# Patient Record
Sex: Male | Born: 1955 | Race: White | Hispanic: No | Marital: Married | Smoking: Current every day smoker
Health system: Southern US, Community
[De-identification: ages and names within clinical notes are randomized; demographics above are authoritative.]

## PROBLEM LIST (undated history)

## (undated) DIAGNOSIS — I10 Essential (primary) hypertension: Secondary | ICD-10-CM

## (undated) DIAGNOSIS — E119 Type 2 diabetes mellitus without complications: Secondary | ICD-10-CM

## (undated) DIAGNOSIS — I200 Unstable angina: Secondary | ICD-10-CM

## (undated) DIAGNOSIS — G4730 Sleep apnea, unspecified: Secondary | ICD-10-CM

## (undated) DIAGNOSIS — Z8719 Personal history of other diseases of the digestive system: Secondary | ICD-10-CM

## (undated) DIAGNOSIS — E785 Hyperlipidemia, unspecified: Secondary | ICD-10-CM

## (undated) DIAGNOSIS — M1990 Unspecified osteoarthritis, unspecified site: Secondary | ICD-10-CM

## (undated) DIAGNOSIS — I739 Peripheral vascular disease, unspecified: Secondary | ICD-10-CM

## (undated) DIAGNOSIS — J449 Chronic obstructive pulmonary disease, unspecified: Secondary | ICD-10-CM

## (undated) DIAGNOSIS — R4781 Slurred speech: Secondary | ICD-10-CM

## (undated) DIAGNOSIS — K219 Gastro-esophageal reflux disease without esophagitis: Secondary | ICD-10-CM

## (undated) DIAGNOSIS — K760 Fatty (change of) liver, not elsewhere classified: Secondary | ICD-10-CM

## (undated) DIAGNOSIS — N186 End stage renal disease: Secondary | ICD-10-CM

## (undated) DIAGNOSIS — K08109 Complete loss of teeth, unspecified cause, unspecified class: Secondary | ICD-10-CM

## (undated) DIAGNOSIS — K5792 Diverticulitis of intestine, part unspecified, without perforation or abscess without bleeding: Secondary | ICD-10-CM

## (undated) DIAGNOSIS — M48062 Spinal stenosis, lumbar region with neurogenic claudication: Secondary | ICD-10-CM

## (undated) DIAGNOSIS — Z972 Presence of dental prosthetic device (complete) (partial): Secondary | ICD-10-CM

## (undated) DIAGNOSIS — G709 Myoneural disorder, unspecified: Secondary | ICD-10-CM

## (undated) DIAGNOSIS — K5730 Diverticulosis of large intestine without perforation or abscess without bleeding: Secondary | ICD-10-CM

## (undated) DIAGNOSIS — T4145XA Adverse effect of unspecified anesthetic, initial encounter: Secondary | ICD-10-CM

## (undated) DIAGNOSIS — Z9289 Personal history of other medical treatment: Secondary | ICD-10-CM

## (undated) DIAGNOSIS — T8859XA Other complications of anesthesia, initial encounter: Secondary | ICD-10-CM

## (undated) DIAGNOSIS — N189 Chronic kidney disease, unspecified: Secondary | ICD-10-CM

## (undated) DIAGNOSIS — Z973 Presence of spectacles and contact lenses: Secondary | ICD-10-CM

## (undated) DIAGNOSIS — I453 Trifascicular block: Secondary | ICD-10-CM

## (undated) DIAGNOSIS — L97521 Non-pressure chronic ulcer of other part of left foot limited to breakdown of skin: Secondary | ICD-10-CM

## (undated) DIAGNOSIS — M869 Osteomyelitis, unspecified: Secondary | ICD-10-CM

## (undated) DIAGNOSIS — Z992 Dependence on renal dialysis: Secondary | ICD-10-CM

## (undated) DIAGNOSIS — I6529 Occlusion and stenosis of unspecified carotid artery: Secondary | ICD-10-CM

## (undated) HISTORY — DX: Fatty (change of) liver, not elsewhere classified: K76.0

## (undated) HISTORY — DX: Unstable angina: I20.0

## (undated) HISTORY — PX: FINGER SURGERY: SHX640

## (undated) HISTORY — DX: Spinal stenosis, lumbar region with neurogenic claudication: M48.062

## (undated) HISTORY — DX: Peripheral vascular disease, unspecified: I73.9

## (undated) HISTORY — DX: Gastro-esophageal reflux disease without esophagitis: K21.9

## (undated) HISTORY — PX: BELOW KNEE LEG AMPUTATION: SUR23

## (undated) HISTORY — DX: Chronic obstructive pulmonary disease, unspecified: J44.9

## (undated) HISTORY — PX: COLON SURGERY: SHX602

## (undated) HISTORY — DX: Diverticulosis of large intestine without perforation or abscess without bleeding: K57.30

## (undated) HISTORY — PX: PENILE PROSTHESIS IMPLANT: SHX240

## (undated) HISTORY — DX: Slurred speech: R47.81

## (undated) HISTORY — DX: Sleep apnea, unspecified: G47.30

## (undated) HISTORY — DX: Unspecified osteoarthritis, unspecified site: M19.90

## (undated) HISTORY — DX: Occlusion and stenosis of unspecified carotid artery: I65.29

## (undated) HISTORY — PX: BACK SURGERY: SHX140

## (undated) HISTORY — PX: CAROTID STENT: SHX1301

## (undated) HISTORY — PX: HERNIA REPAIR: SHX51

## (undated) HISTORY — PX: TOE AMPUTATION: SHX809

## (undated) HISTORY — PX: COLONOSCOPY: SHX174

## (undated) HISTORY — DX: Non-pressure chronic ulcer of other part of left foot limited to breakdown of skin: L97.521

## (undated) HISTORY — PX: FEMORAL ARTERY STENT: SHX1583

## (undated) HISTORY — PX: TONSILLECTOMY: SUR1361

## (undated) HISTORY — PX: SPINE SURGERY: SHX786

## (undated) HISTORY — DX: Essential (primary) hypertension: I10

## (undated) HISTORY — DX: Hyperlipidemia, unspecified: E78.5

## (undated) HISTORY — PX: NECK SURGERY: SHX720

## (undated) HISTORY — PX: CHOLECYSTECTOMY: SHX55

## (undated) HISTORY — PX: SHOULDER ARTHROSCOPY: SHX128

## (undated) HISTORY — PX: TOTAL KNEE ARTHROPLASTY: SHX125

## (undated) HISTORY — DX: Trifascicular block: I45.3

## (undated) HISTORY — PX: LAMINECTOMY: SHX219

## (undated) SURGERY — Surgical Case
Anesthesia: *Unknown

---

## 1998-02-09 ENCOUNTER — Ambulatory Visit (HOSPITAL_COMMUNITY): Admission: RE | Admit: 1998-02-09 | Discharge: 1998-02-09 | Payer: Self-pay | Admitting: Neurological Surgery

## 1998-02-20 ENCOUNTER — Ambulatory Visit (HOSPITAL_COMMUNITY): Admission: RE | Admit: 1998-02-20 | Discharge: 1998-02-20 | Payer: Self-pay | Admitting: Neurological Surgery

## 1998-03-25 ENCOUNTER — Other Ambulatory Visit: Admission: RE | Admit: 1998-03-25 | Discharge: 1998-03-25 | Payer: Self-pay | Admitting: *Deleted

## 1998-12-29 ENCOUNTER — Inpatient Hospital Stay (HOSPITAL_COMMUNITY): Admission: RE | Admit: 1998-12-29 | Discharge: 1998-12-30 | Payer: Self-pay | Admitting: Orthopaedic Surgery

## 1999-09-11 HISTORY — PX: JOINT REPLACEMENT: SHX530

## 1999-10-06 ENCOUNTER — Encounter: Admission: RE | Admit: 1999-10-06 | Discharge: 1999-10-06 | Payer: Self-pay | Admitting: General Surgery

## 1999-10-06 ENCOUNTER — Encounter (HOSPITAL_BASED_OUTPATIENT_CLINIC_OR_DEPARTMENT_OTHER): Payer: Self-pay | Admitting: General Surgery

## 1999-10-09 ENCOUNTER — Ambulatory Visit (HOSPITAL_BASED_OUTPATIENT_CLINIC_OR_DEPARTMENT_OTHER): Admission: RE | Admit: 1999-10-09 | Discharge: 1999-10-09 | Payer: Self-pay | Admitting: General Surgery

## 1999-10-09 ENCOUNTER — Encounter (INDEPENDENT_AMBULATORY_CARE_PROVIDER_SITE_OTHER): Payer: Self-pay | Admitting: Specialist

## 1999-10-11 ENCOUNTER — Encounter: Payer: Self-pay | Admitting: Emergency Medicine

## 1999-10-12 ENCOUNTER — Encounter: Payer: Self-pay | Admitting: Emergency Medicine

## 1999-10-12 ENCOUNTER — Inpatient Hospital Stay (HOSPITAL_COMMUNITY): Admission: EM | Admit: 1999-10-12 | Discharge: 1999-10-13 | Payer: Self-pay | Admitting: Emergency Medicine

## 2000-08-05 ENCOUNTER — Encounter: Admission: RE | Admit: 2000-08-05 | Discharge: 2000-11-03 | Payer: Self-pay | Admitting: Endocrinology

## 2000-08-28 ENCOUNTER — Ambulatory Visit (HOSPITAL_COMMUNITY): Admission: RE | Admit: 2000-08-28 | Discharge: 2000-08-28 | Payer: Self-pay | Admitting: Endocrinology

## 2000-08-28 ENCOUNTER — Encounter: Payer: Self-pay | Admitting: Endocrinology

## 2000-08-30 ENCOUNTER — Encounter: Payer: Self-pay | Admitting: Endocrinology

## 2000-08-30 ENCOUNTER — Ambulatory Visit (HOSPITAL_COMMUNITY): Admission: RE | Admit: 2000-08-30 | Discharge: 2000-08-30 | Payer: Self-pay | Admitting: Endocrinology

## 2001-01-09 ENCOUNTER — Encounter: Admission: RE | Admit: 2001-01-09 | Discharge: 2001-01-09 | Payer: Self-pay

## 2001-07-02 ENCOUNTER — Encounter: Admission: RE | Admit: 2001-07-02 | Discharge: 2001-09-30 | Payer: Self-pay | Admitting: Endocrinology

## 2002-07-11 HISTORY — PX: TOTAL KNEE ARTHROPLASTY: SHX125

## 2002-07-23 ENCOUNTER — Encounter: Payer: Self-pay | Admitting: Orthopedic Surgery

## 2002-07-29 ENCOUNTER — Inpatient Hospital Stay (HOSPITAL_COMMUNITY): Admission: RE | Admit: 2002-07-29 | Discharge: 2002-08-03 | Payer: Self-pay | Admitting: Orthopedic Surgery

## 2002-07-29 ENCOUNTER — Encounter: Payer: Self-pay | Admitting: Orthopedic Surgery

## 2003-07-14 ENCOUNTER — Inpatient Hospital Stay (HOSPITAL_COMMUNITY): Admission: RE | Admit: 2003-07-14 | Discharge: 2003-07-17 | Payer: Self-pay | Admitting: Orthopaedic Surgery

## 2004-10-27 ENCOUNTER — Inpatient Hospital Stay (HOSPITAL_COMMUNITY): Admission: EM | Admit: 2004-10-27 | Discharge: 2004-11-10 | Payer: Self-pay | Admitting: *Deleted

## 2004-10-30 ENCOUNTER — Encounter (INDEPENDENT_AMBULATORY_CARE_PROVIDER_SITE_OTHER): Payer: Self-pay | Admitting: Cardiology

## 2004-10-31 HISTORY — PX: CARDIAC CATHETERIZATION: SHX172

## 2004-11-09 ENCOUNTER — Encounter (INDEPENDENT_AMBULATORY_CARE_PROVIDER_SITE_OTHER): Payer: Self-pay | Admitting: *Deleted

## 2004-11-09 HISTORY — PX: LAPAROSCOPIC CHOLECYSTECTOMY W/ CHOLANGIOGRAPHY: SUR757

## 2005-01-25 ENCOUNTER — Ambulatory Visit (HOSPITAL_COMMUNITY): Admission: RE | Admit: 2005-01-25 | Discharge: 2005-01-25 | Payer: Self-pay | Admitting: *Deleted

## 2005-01-25 ENCOUNTER — Encounter (INDEPENDENT_AMBULATORY_CARE_PROVIDER_SITE_OTHER): Payer: Self-pay | Admitting: Specialist

## 2005-08-14 ENCOUNTER — Ambulatory Visit (HOSPITAL_COMMUNITY): Admission: RE | Admit: 2005-08-14 | Discharge: 2005-08-14 | Payer: Self-pay | Admitting: Urology

## 2005-08-14 ENCOUNTER — Ambulatory Visit (HOSPITAL_BASED_OUTPATIENT_CLINIC_OR_DEPARTMENT_OTHER): Admission: RE | Admit: 2005-08-14 | Discharge: 2005-08-14 | Payer: Self-pay | Admitting: Urology

## 2005-08-14 HISTORY — PX: PENILE PROSTHESIS IMPLANT: SHX240

## 2006-02-06 ENCOUNTER — Inpatient Hospital Stay (HOSPITAL_COMMUNITY): Admission: RE | Admit: 2006-02-06 | Discharge: 2006-02-07 | Payer: Self-pay | Admitting: Orthopaedic Surgery

## 2006-02-06 HISTORY — PX: ANTERIOR FUSION CERVICAL SPINE: SUR626

## 2006-11-06 ENCOUNTER — Encounter (INDEPENDENT_AMBULATORY_CARE_PROVIDER_SITE_OTHER): Payer: Self-pay | Admitting: Specialist

## 2006-11-06 ENCOUNTER — Ambulatory Visit (HOSPITAL_COMMUNITY): Admission: RE | Admit: 2006-11-06 | Discharge: 2006-11-06 | Payer: Self-pay | Admitting: *Deleted

## 2006-11-08 ENCOUNTER — Encounter: Admission: RE | Admit: 2006-11-08 | Discharge: 2006-11-08 | Payer: Self-pay | Admitting: Orthopaedic Surgery

## 2006-11-15 ENCOUNTER — Inpatient Hospital Stay (HOSPITAL_COMMUNITY): Admission: RE | Admit: 2006-11-15 | Discharge: 2006-11-18 | Payer: Self-pay | Admitting: Orthopedic Surgery

## 2007-10-10 ENCOUNTER — Ambulatory Visit (HOSPITAL_COMMUNITY): Admission: RE | Admit: 2007-10-10 | Discharge: 2007-10-10 | Payer: Self-pay | Admitting: General Surgery

## 2008-08-10 ENCOUNTER — Encounter (INDEPENDENT_AMBULATORY_CARE_PROVIDER_SITE_OTHER): Payer: Self-pay | Admitting: *Deleted

## 2008-08-10 ENCOUNTER — Ambulatory Visit (HOSPITAL_COMMUNITY): Admission: RE | Admit: 2008-08-10 | Discharge: 2008-08-10 | Payer: Self-pay | Admitting: *Deleted

## 2009-04-13 ENCOUNTER — Emergency Department (HOSPITAL_COMMUNITY): Admission: EM | Admit: 2009-04-13 | Discharge: 2009-04-13 | Payer: Self-pay | Admitting: Emergency Medicine

## 2009-05-13 ENCOUNTER — Encounter: Admission: RE | Admit: 2009-05-13 | Discharge: 2009-05-13 | Payer: Self-pay | Admitting: Orthopedic Surgery

## 2010-08-30 ENCOUNTER — Emergency Department (HOSPITAL_COMMUNITY)
Admission: EM | Admit: 2010-08-30 | Discharge: 2010-08-30 | Payer: Self-pay | Source: Home / Self Care | Admitting: Emergency Medicine

## 2010-09-30 ENCOUNTER — Emergency Department (HOSPITAL_COMMUNITY)
Admission: EM | Admit: 2010-09-30 | Discharge: 2010-09-30 | Payer: Self-pay | Source: Home / Self Care | Admitting: Emergency Medicine

## 2010-10-03 LAB — CBC
HCT: 38.9 % — ABNORMAL LOW (ref 39.0–52.0)
Hemoglobin: 14 g/dL (ref 13.0–17.0)
MCH: 30.4 pg (ref 26.0–34.0)
MCHC: 36 g/dL (ref 30.0–36.0)
MCV: 84.4 fL (ref 78.0–100.0)
Platelets: 201 10*3/uL (ref 150–400)
RBC: 4.61 MIL/uL (ref 4.22–5.81)
RDW: 12.6 % (ref 11.5–15.5)
WBC: 7.1 10*3/uL (ref 4.0–10.5)

## 2010-10-03 LAB — DIFFERENTIAL
Basophils Absolute: 0.1 10*3/uL (ref 0.0–0.1)
Basophils Relative: 1 % (ref 0–1)
Eosinophils Absolute: 0.4 10*3/uL (ref 0.0–0.7)
Eosinophils Relative: 5 % (ref 0–5)
Lymphocytes Relative: 25 % (ref 12–46)
Lymphs Abs: 1.8 10*3/uL (ref 0.7–4.0)
Monocytes Absolute: 0.6 10*3/uL (ref 0.1–1.0)
Monocytes Relative: 9 % (ref 3–12)
Neutro Abs: 4.3 10*3/uL (ref 1.7–7.7)
Neutrophils Relative %: 61 % (ref 43–77)

## 2010-10-03 LAB — POCT I-STAT, CHEM 8
BUN: 11 mg/dL (ref 6–23)
Calcium, Ion: 1.17 mmol/L (ref 1.12–1.32)
Chloride: 101 mEq/L (ref 96–112)
Creatinine, Ser: 0.8 mg/dL (ref 0.4–1.5)
Glucose, Bld: 262 mg/dL — ABNORMAL HIGH (ref 70–99)
HCT: 41 % (ref 39.0–52.0)
Hemoglobin: 13.9 g/dL (ref 13.0–17.0)
Potassium: 3.8 mEq/L (ref 3.5–5.1)
Sodium: 136 mEq/L (ref 135–145)
TCO2: 27 mmol/L (ref 0–100)

## 2010-10-03 LAB — GLUCOSE, CAPILLARY: Glucose-Capillary: 280 mg/dL — ABNORMAL HIGH (ref 70–99)

## 2010-10-11 ENCOUNTER — Other Ambulatory Visit: Payer: Self-pay | Admitting: Endocrinology

## 2010-10-11 DIAGNOSIS — H539 Unspecified visual disturbance: Secondary | ICD-10-CM

## 2010-10-11 DIAGNOSIS — R200 Anesthesia of skin: Secondary | ICD-10-CM

## 2010-10-12 ENCOUNTER — Ambulatory Visit
Admission: RE | Admit: 2010-10-12 | Discharge: 2010-10-12 | Disposition: A | Discharge status: Home / Self Care | Payer: Medicare Other | Source: Ambulatory Visit | Attending: Endocrinology | Admitting: Endocrinology

## 2010-10-12 DIAGNOSIS — R200 Anesthesia of skin: Secondary | ICD-10-CM

## 2010-10-12 DIAGNOSIS — R42 Dizziness and giddiness: Secondary | ICD-10-CM

## 2010-10-12 DIAGNOSIS — H539 Unspecified visual disturbance: Secondary | ICD-10-CM

## 2010-10-12 MED ORDER — IOHEXOL 300 MG/ML  SOLN
75.0000 mL | Freq: Once | INTRAMUSCULAR | Status: AC | PRN
  Administered 2010-10-12: 75 mL via INTRAVENOUS

## 2010-10-23 ENCOUNTER — Other Ambulatory Visit: Payer: Self-pay | Admitting: Neurology

## 2010-10-23 DIAGNOSIS — R42 Dizziness and giddiness: Secondary | ICD-10-CM

## 2010-10-25 ENCOUNTER — Ambulatory Visit
Admission: RE | Admit: 2010-10-25 | Discharge: 2010-10-25 | Disposition: A | Discharge status: Home / Self Care | Payer: Medicare Other | Source: Ambulatory Visit | Attending: Neurology | Admitting: Neurology

## 2010-10-25 DIAGNOSIS — R42 Dizziness and giddiness: Secondary | ICD-10-CM

## 2010-10-25 MED ORDER — IOHEXOL 350 MG/ML SOLN: 100.0000 mL | Freq: Once | INTRAVENOUS | Status: DC | PRN

## 2010-11-06 ENCOUNTER — Other Ambulatory Visit (INDEPENDENT_AMBULATORY_CARE_PROVIDER_SITE_OTHER): Payer: BC Managed Care – HMO

## 2010-11-06 ENCOUNTER — Encounter (INDEPENDENT_AMBULATORY_CARE_PROVIDER_SITE_OTHER): Payer: BC Managed Care – HMO | Admitting: Vascular Surgery

## 2010-11-06 DIAGNOSIS — I6529 Occlusion and stenosis of unspecified carotid artery: Secondary | ICD-10-CM

## 2010-11-07 ENCOUNTER — Encounter: Payer: BC Managed Care – HMO | Admitting: Vascular Surgery

## 2010-11-07 NOTE — Consult Note (Signed)
NEW PATIENT CONSULTATION  [name], [name] DOB:  26-Mar-1956                                       11/06/2010 [id number]  HISTORY OF PRESENT ILLNESS:  The patient is a 55 year old male referred by Dr. Marcial Pacas.  The patient is a medical patient of Dr. Gareth Eagle. This patient began having visual symptoms in December 2011 which he describes as color changes in his left visual field but not total blindness.  Initially, these were intermittent but he states now they are almost constant.  His wife states he has also had some episodes of slurred speech lasting a few minutes, but no episodes of hemiparesis. He also complains of some vague weakness in both extremities, upper and lower on occasion.  He has no history of previous stroke.  He had a previous evaluation by Dr. Krista Blue for these neurologic symptoms.  This consisted of an MRI and CT scan as well as a CT angiogram.  The CT angiogram I have reviewed and reveals a very tight left internal carotid stenosis with no right internal carotid occlusive disease.  There is a string sign with very diminutive flow to the base of the skull.  There is atypical atherosclerotic plaque at the left carotid bifurcation. There is no evidence of acute stroke or hemorrhage.  The patient has been on Plavix for the past 2 weeks.  He has continued to have intermittent symptoms but these have been going on for 2 months he states.  CHRONIC MEDICAL PROBLEMS: 1. Diabetes mellitus type 1. 2. Hypertension. 3. Hyperlipidemia. 4. COPD. 5. Degenerative joint disease. 6. Previous lumbar laminectomy on 3 occasions and 2 cervical spine     operative procedures as well as a right total knee replacement and     rotator cuff surgery in the right shoulder. 7. GERD. 8. Negative for coronary artery disease or stroke.  SOCIAL HISTORY:  The patient is married, has 3 children, and is disabled.  He does smoke a pack of cigarettes per day.  He does  not use alcohol.  FAMILY HISTORY:  Positive for coronary artery disease and diabetes in the father.  Negative for stroke.  REVIEW OF SYSTEMS:  Positive for anorexia, dizziness, headaches, lower extremity discomfort with walking, wheezing, arthritis joint pain, chest pain, dyspnea on exertion, depression, reflux esophagitis, hiatal hernia repair, diarrhea.  All other systems are negative in complete review of systems.  PHYSICAL EXAM:  Blood pressure 173/99, heart rate 97, respirations 24. General:  He is a well-developed, well-nourished male who is in no apparent distress, alert and oriented x3.  HEENT exam is normal for age. EOMs intact.  Lungs:  Clear to auscultation.  No rhonchi or wheezing. Cardiovascular exam:  Regular rhythm.  No murmurs.  Carotid pulses are 3+.  No audible bruits.  Abdomen:  Soft, nontender with no masses. Musculoskeletal:  Exam is free of major deformities.  Neurologic: Normal.  Skin:  Free of rashes.  Lower extremity exam reveals 3+ femoral and 2+ dorsalis pedis pulses palpable bilaterally.  Today, I ordered a carotid duplex exam in our office which I have reviewed and interpreted.  He does have a very tight left internal carotid stenosis at the bifurcation with visualization of some flow distally but not consistently in the very distal internal carotid artery.  IMPRESSION:  Severe left internal carotid occlusive disease.  I doubt  that the distal vessel is occluded after reviewing CT angiogram and duplex scan but this is a possibility.  Because of the patient having ongoing symptoms, I think the best plan would be to explore left carotid bifurcation and perform an endarterectomy if feasible.  If the patient is found to have a totally occluded internal carotid artery, hopefully this will not cause any neurologic changes since I did not alter his circulation.  We will get a Cardiac clearance from Dr. Rollene Fare prior to surgery since he has seen Dr. Rollene Fare  in the past and he does complain of chest pain.  The risks and benefits of this carotid surgery including stroke and delayed hemorrhage in the left hemisphere have been thoroughly discussed with the patient and his wife, and they do understand and would like to proceed.  We have scheduled his surgery for this Friday, March 2nd.    Nelda Severe Kellie Simmering, M.D. Electronically Signed  JDL/MEDQ  D:  11/06/2010  T:  11/07/2010  Job:  [id number]

## 2010-11-09 ENCOUNTER — Encounter (HOSPITAL_COMMUNITY)
Admission: RE | Admit: 2010-11-09 | Discharge: 2010-11-09 | Disposition: A | Discharge status: Home / Self Care | Payer: BC Managed Care – HMO | Source: Ambulatory Visit | Attending: Vascular Surgery | Admitting: Vascular Surgery

## 2010-11-09 DIAGNOSIS — Z01812 Encounter for preprocedural laboratory examination: Secondary | ICD-10-CM | POA: Insufficient documentation

## 2010-11-09 LAB — CBC
HCT: 42.7 % (ref 39.0–52.0)
Hemoglobin: 15.9 g/dL (ref 13.0–17.0)
MCH: 31 pg (ref 26.0–34.0)
MCHC: 37.2 g/dL — ABNORMAL HIGH (ref 30.0–36.0)
MCV: 83.2 fL (ref 78.0–100.0)
Platelets: 211 10*3/uL (ref 150–400)
RBC: 5.13 MIL/uL (ref 4.22–5.81)
RDW: 12.7 % (ref 11.5–15.5)
WBC: 13.9 10*3/uL — ABNORMAL HIGH (ref 4.0–10.5)

## 2010-11-09 LAB — PROTIME-INR
INR: 0.91 (ref 0.00–1.49)
Prothrombin Time: 12.5 seconds (ref 11.6–15.2)

## 2010-11-09 LAB — COMPREHENSIVE METABOLIC PANEL
ALT: 17 U/L (ref 0–53)
AST: 16 U/L (ref 0–37)
Albumin: 3.9 g/dL (ref 3.5–5.2)
Alkaline Phosphatase: 113 U/L (ref 39–117)
BUN: 9 mg/dL (ref 6–23)
CO2: 25 mEq/L (ref 19–32)
Calcium: 9.2 mg/dL (ref 8.4–10.5)
Chloride: 97 mEq/L (ref 96–112)
Creatinine, Ser: 0.7 mg/dL (ref 0.4–1.5)
GFR calc Af Amer: >60 mL/min (ref >60)
GFR calc non Af Amer: >60 mL/min (ref >60)
Glucose, Bld: 311 mg/dL — ABNORMAL HIGH (ref 70–99)
Potassium: 4.1 mEq/L (ref 3.5–5.1)
Sodium: 134 mEq/L — ABNORMAL LOW (ref 135–145)
Total Bilirubin: 0.8 mg/dL (ref 0.3–1.2)
Total Protein: 6.1 g/dL (ref 6.0–8.3)

## 2010-11-09 LAB — URINE MICROSCOPIC-ADD ON

## 2010-11-09 LAB — URINALYSIS, ROUTINE W REFLEX MICROSCOPIC
Bilirubin Urine: NEGATIVE
Hgb urine dipstick: NEGATIVE
Ketones, ur: NEGATIVE mg/dL
Leukocytes, UA: NEGATIVE
Nitrite: NEGATIVE
Protein, ur: 30 mg/dL — AB
Specific Gravity, Urine: 1.035 — ABNORMAL HIGH (ref 1.005–1.030)
Urine Glucose, Fasting: >1000 mg/dL — AB
Urobilinogen, UA: 0.2 mg/dL (ref 0.0–1.0)
pH: 6 (ref 5.0–8.0)

## 2010-11-09 LAB — SURGICAL PCR SCREEN
MRSA, PCR: NEGATIVE
Staphylococcus aureus: NEGATIVE

## 2010-11-09 LAB — APTT: aPTT: 24 seconds (ref 24–37)

## 2010-11-09 NOTE — Procedures (Unsigned)
CAROTID DUPLEX EXAM  INDICATION:  Carotid artery disease.  HISTORY: Diabetes:  Yes. Cardiac:  No. Hypertension:  Yes. Smoking:  Yes. Previous Surgery:  No. CV History:  Near syncope, vision abnormalities, slurred speech, all per the patient.                                      RIGHT             LEFT Brachial systolic pressure:         173               179 Brachial Doppler waveforms: Vertebral direction of flow:        Antegrade         Antegrade DUPLEX VELOCITIES (cm/sec) CCA peak systolic                   112               [id number] ECA peak systolic                   145               [id number] ICA peak systolic                   M=87              [id number] ICA end diastolic                   37                129 PLAQUE MORPHOLOGY:                  Calcific          Homogeneous PLAQUE AMOUNT:                      Minimal           Large PLAQUE LOCATION:                    Bifurcation       ICA  IMPRESSION: 1. Right internal carotid artery shows no evidence of hemodynamically     significant stenosis. 2. Left internal carotid artery velocities are suggestive of 80% to     99% stenosis proximally, with mid velocities dropping to 87 cm/s     with no end diastolic.  Unable to visualize flow distally.  Cannot     rule out minimal distal flow. 3. Left internal carotid artery narrow channel of flow where     visualized.  ___________________________________________ Nelda Severe Kellie Simmering, M.D.  AS/MEDQ  D:  11/06/2010  T:  11/06/2010  Job:  [id number]

## 2010-11-10 ENCOUNTER — Inpatient Hospital Stay (HOSPITAL_COMMUNITY)
Admission: RE | Admit: 2010-11-10 | Discharge: 2010-11-12 | DRG: 838 | Disposition: A | Discharge status: Home / Self Care | Payer: BC Managed Care – HMO | Source: Ambulatory Visit | Attending: Vascular Surgery | Admitting: Vascular Surgery

## 2010-11-10 ENCOUNTER — Inpatient Hospital Stay (HOSPITAL_COMMUNITY): Payer: BC Managed Care – HMO

## 2010-11-10 ENCOUNTER — Other Ambulatory Visit: Payer: Self-pay | Admitting: Vascular Surgery

## 2010-11-10 DIAGNOSIS — Z01818 Encounter for other preprocedural examination: Secondary | ICD-10-CM

## 2010-11-10 DIAGNOSIS — I6529 Occlusion and stenosis of unspecified carotid artery: Secondary | ICD-10-CM

## 2010-11-10 DIAGNOSIS — R072 Precordial pain: Secondary | ICD-10-CM | POA: Diagnosis not present

## 2010-11-10 DIAGNOSIS — F17200 Nicotine dependence, unspecified, uncomplicated: Secondary | ICD-10-CM | POA: Diagnosis present

## 2010-11-10 DIAGNOSIS — Z7902 Long term (current) use of antithrombotics/antiplatelets: Secondary | ICD-10-CM

## 2010-11-10 DIAGNOSIS — J4489 Other specified chronic obstructive pulmonary disease: Secondary | ICD-10-CM | POA: Diagnosis present

## 2010-11-10 DIAGNOSIS — Z794 Long term (current) use of insulin: Secondary | ICD-10-CM

## 2010-11-10 DIAGNOSIS — E119 Type 2 diabetes mellitus without complications: Secondary | ICD-10-CM | POA: Diagnosis present

## 2010-11-10 DIAGNOSIS — J449 Chronic obstructive pulmonary disease, unspecified: Secondary | ICD-10-CM | POA: Diagnosis present

## 2010-11-10 DIAGNOSIS — Z7982 Long term (current) use of aspirin: Secondary | ICD-10-CM

## 2010-11-10 DIAGNOSIS — G4733 Obstructive sleep apnea (adult) (pediatric): Secondary | ICD-10-CM | POA: Diagnosis present

## 2010-11-10 HISTORY — PX: CAROTID ENDARTERECTOMY: SUR193

## 2010-11-10 HISTORY — DX: Occlusion and stenosis of unspecified carotid artery: I65.29

## 2010-11-10 LAB — GLUCOSE, CAPILLARY
Glucose-Capillary: 518 mg/dL — ABNORMAL HIGH (ref 70–99)
Glucose-Capillary: 369 mg/dL — ABNORMAL HIGH (ref 70–99)
Glucose-Capillary: 244 mg/dL — ABNORMAL HIGH (ref 70–99)
Glucose-Capillary: 232 mg/dL — ABNORMAL HIGH (ref 70–99)
Glucose-Capillary: 129 mg/dL — ABNORMAL HIGH (ref 70–99)

## 2010-11-11 LAB — GLUCOSE, CAPILLARY
Glucose-Capillary: 232 mg/dL — ABNORMAL HIGH (ref 70–99)
Glucose-Capillary: 513 mg/dL — ABNORMAL HIGH (ref 70–99)
Glucose-Capillary: 368 mg/dL — ABNORMAL HIGH (ref 70–99)
Glucose-Capillary: 169 mg/dL — ABNORMAL HIGH (ref 70–99)
Glucose-Capillary: 256 mg/dL — ABNORMAL HIGH (ref 70–99)
Glucose-Capillary: 278 mg/dL — ABNORMAL HIGH (ref 70–99)

## 2010-11-11 LAB — CBC
HCT: 35.3 % — ABNORMAL LOW (ref 39.0–52.0)
Hemoglobin: 12.6 g/dL — ABNORMAL LOW (ref 13.0–17.0)
MCH: 29.9 pg (ref 26.0–34.0)
MCHC: 35.7 g/dL (ref 30.0–36.0)
MCV: 83.6 fL (ref 78.0–100.0)
Platelets: 171 10*3/uL (ref 150–400)
RBC: 4.22 MIL/uL (ref 4.22–5.81)
RDW: 13 % (ref 11.5–15.5)
WBC: 9.9 10*3/uL (ref 4.0–10.5)

## 2010-11-11 LAB — BASIC METABOLIC PANEL
BUN: 14 mg/dL (ref 6–23)
CO2: 22 mEq/L (ref 19–32)
Calcium: 8.7 mg/dL (ref 8.4–10.5)
Chloride: 103 mEq/L (ref 96–112)
Creatinine, Ser: 0.81 mg/dL (ref 0.4–1.5)
GFR calc Af Amer: >60 mL/min (ref >60)
GFR calc non Af Amer: >60 mL/min (ref >60)
Glucose, Bld: 248 mg/dL — ABNORMAL HIGH (ref 70–99)
Potassium: 3.9 mEq/L (ref 3.5–5.1)
Sodium: 134 mEq/L — ABNORMAL LOW (ref 135–145)

## 2010-11-11 LAB — CARDIAC PANEL(CRET KIN+CKTOT+MB+TROPI)
CK, MB: 1.2 ng/mL (ref 0.3–4.0)
CK, MB: 1.3 ng/mL (ref 0.3–4.0)
CK, MB: 1.1 ng/mL (ref 0.3–4.0)
Relative Index: INVALID (ref 0.0–2.5)
Relative Index: INVALID (ref 0.0–2.5)
Relative Index: INVALID (ref 0.0–2.5)
Total CK: 53 U/L (ref 7–232)
Total CK: 59 U/L (ref 7–232)
Total CK: 65 U/L (ref 7–232)
Troponin I: <0.01 ng/mL (ref 0.00–0.06)
Troponin I: 0.01 ng/mL (ref 0.00–0.06)
Troponin I: 0.01 ng/mL (ref 0.00–0.06)

## 2010-11-11 LAB — HEMOGLOBIN A1C
Hgb A1c MFr Bld: 9.9 % — ABNORMAL HIGH (ref <5.7)
Mean Plasma Glucose: 237 mg/dL — ABNORMAL HIGH (ref <117)

## 2010-11-12 LAB — CROSSMATCH
ABO/RH(D): AB NEG
Antibody Screen: NEGATIVE
Unit division: 0
Unit division: 0

## 2010-11-12 LAB — GLUCOSE, CAPILLARY: Glucose-Capillary: 169 mg/dL — ABNORMAL HIGH (ref 70–99)

## 2010-11-12 LAB — CARDIAC PANEL(CRET KIN+CKTOT+MB+TROPI)
CK, MB: 1.1 ng/mL (ref 0.3–4.0)
Relative Index: INVALID (ref 0.0–2.5)
Total CK: 54 U/L (ref 7–232)
Troponin I: <0.01 ng/mL (ref 0.00–0.06)

## 2010-11-13 NOTE — Op Note (Signed)
[name], [name]                 ACCOUNT NO.:  [id number]  MEDICAL RECORD NO.:  [id number]           PATIENT TYPE:  I  LOCATION:  [id number]                         FACILITY:  [facility]  PHYSICIAN:  Nelda Severe. Kellie Simmering, M.D.  DATE OF BIRTH:  [birth year]  DATE OF PROCEDURE:  11/10/2010 DATE OF DISCHARGE:                              OPERATIVE REPORT   PREOPERATIVE DIAGNOSIS:  Subtotal occlusion of left internal carotid artery with left hemispheric transient ischemic attacks.  POSTOPERATIVE DIAGNOSIS:  Subtotal occlusion of left internal carotid artery with left hemispheric transient ischemic attacks.  OPERATION:  Left carotid endarterectomy with Dacron patch angioplasty.  SURGEON:  Nelda Severe. Kellie Simmering, MD  FIRST ASSISTANT: 1. Annamarie Major IV, MD  ANESTHESIA:  General endotracheal.  BRIEF HISTORY:  This patient has suffered multiple episodes of dizziness, visual changes in his left eye with multiple color patterns in his left visual field, as well as some occasional weakness in upper extremities bilaterally.  He was found to have essentially a total occlusion or 99% stenosis of the left internal carotid artery with a string sign on CT angiography.  He was scheduled for exploration of the left carotid artery with probable endarterectomy.  PROCEDURE:  The patient was taken to the operating room, placed in the supine position, at which time satisfactory general endotracheal anesthesia was administered.  The left neck was prepped with Betadine scrub and solution, draped in routine sterile manner.  Incision was made along the anterior border of the sternocleidomastoid muscle and carried down through subcutaneous tissue and platysma using the Bovie.  Common facial vein and external jugular veins were ligated with 3-0 silk ties and divided exposing the common internal and external carotid arteries. Care was taken not to injure the vagus or hypoglossal nerves both of which were exposed.  There was  a calcified atherosclerotic plaque at the carotid bifurcation extending up only 2-3 cm.  Distal vessel was pulseless but appeared normal.  A #10 shunt was prepared and the patient was heparinized.  The carotid vessels were occluded with vascular clamps.  Longitudinal opening made in the common carotid with 15 blade, extended up the internal carotid with Potts scissors to a point distal to the disease.  The plaque was almost totally occlusive in nature, but there was no thrombus in the vessel and the vessel distal to the plaque was totally normal.  The #10 shunt was inserted without difficulty reestablishing flow in about 2 minutes.  Backbleeding was present but sluggish and nonpulsatile.  Standard endarterectomy was then performed using the elevator and Potts scissors with an eversion endarterectomy of the external carotid.  The plaque feathered off distal internal carotid artery nicely, not requiring any tacking sutures.  Lumen was thoroughly irrigated with heparin and saline and all loose debris carefully removed.  Arteriotomy was closed with a patch using continuous 6-0 Prolene.  Prior to completion of closure, shunt was removed after 30 minutes of shunt time.  Following antegrade and retrograde flushing, closure was completed with reestablishment of flow initially up the external and up the internal branch.  Carotid was occluded for less than 2  minutes for removal of the shunt.  Protamine was then given to reverse the heparin.  Following adequate hemostasis, wound was irrigated with saline, closed in layers with Vicryl in subcuticular fashion.  Sterile dressing applied.  The patient taken to recovery room in stable condition.     Nelda Severe Kellie Simmering, M.D.     JDL/MEDQ  D:  11/10/2010  T:  11/11/2010  Job:  [id number]  Electronically Signed by Tinnie Gens M.D. on 11/13/2010 02:23:05 PM

## 2010-11-15 ENCOUNTER — Emergency Department (HOSPITAL_COMMUNITY)
Admission: EM | Admit: 2010-11-15 | Discharge: 2010-11-15 | Disposition: A | Discharge status: Home / Self Care | Payer: BC Managed Care – HMO | Attending: Emergency Medicine | Admitting: Emergency Medicine

## 2010-11-15 ENCOUNTER — Emergency Department (HOSPITAL_COMMUNITY): Payer: BC Managed Care – HMO

## 2010-11-15 DIAGNOSIS — I10 Essential (primary) hypertension: Secondary | ICD-10-CM | POA: Insufficient documentation

## 2010-11-15 DIAGNOSIS — E119 Type 2 diabetes mellitus without complications: Secondary | ICD-10-CM | POA: Insufficient documentation

## 2010-11-15 DIAGNOSIS — I2510 Atherosclerotic heart disease of native coronary artery without angina pectoris: Secondary | ICD-10-CM | POA: Insufficient documentation

## 2010-11-15 DIAGNOSIS — G2581 Restless legs syndrome: Secondary | ICD-10-CM | POA: Insufficient documentation

## 2010-11-15 DIAGNOSIS — R0789 Other chest pain: Secondary | ICD-10-CM | POA: Insufficient documentation

## 2010-11-15 DIAGNOSIS — E785 Hyperlipidemia, unspecified: Secondary | ICD-10-CM | POA: Insufficient documentation

## 2010-11-15 LAB — DIFFERENTIAL
Basophils Absolute: 0 10*3/uL (ref 0.0–0.1)
Basophils Relative: 0 % (ref 0–1)
Eosinophils Absolute: 0.1 10*3/uL (ref 0.0–0.7)
Eosinophils Relative: 1 % (ref 0–5)
Lymphocytes Relative: 12 % (ref 12–46)
Lymphs Abs: 1.4 10*3/uL (ref 0.7–4.0)
Monocytes Absolute: 0.8 10*3/uL (ref 0.1–1.0)
Monocytes Relative: 7 % (ref 3–12)
Neutro Abs: 9.7 10*3/uL — ABNORMAL HIGH (ref 1.7–7.7)
Neutrophils Relative %: 80 % — ABNORMAL HIGH (ref 43–77)

## 2010-11-15 LAB — CBC
HCT: 40.2 % (ref 39.0–52.0)
Hemoglobin: 14.7 g/dL (ref 13.0–17.0)
MCH: 30.8 pg (ref 26.0–34.0)
MCHC: 36.6 g/dL — ABNORMAL HIGH (ref 30.0–36.0)
MCV: 84.3 fL (ref 78.0–100.0)
Platelets: 208 10*3/uL (ref 150–400)
RBC: 4.77 MIL/uL (ref 4.22–5.81)
RDW: 12.7 % (ref 11.5–15.5)
WBC: 12 10*3/uL — ABNORMAL HIGH (ref 4.0–10.5)

## 2010-11-15 LAB — COMPREHENSIVE METABOLIC PANEL
ALT: 15 U/L (ref 0–53)
AST: 18 U/L (ref 0–37)
Albumin: 3.7 g/dL (ref 3.5–5.2)
Alkaline Phosphatase: 103 U/L (ref 39–117)
BUN: 8 mg/dL (ref 6–23)
CO2: 23 mEq/L (ref 19–32)
Calcium: 9.2 mg/dL (ref 8.4–10.5)
Chloride: 99 mEq/L (ref 96–112)
Creatinine, Ser: 0.65 mg/dL (ref 0.4–1.5)
GFR calc Af Amer: >60 mL/min (ref >60)
GFR calc non Af Amer: >60 mL/min (ref >60)
Glucose, Bld: 214 mg/dL — ABNORMAL HIGH (ref 70–99)
Potassium: 4.3 mEq/L (ref 3.5–5.1)
Sodium: 131 mEq/L — ABNORMAL LOW (ref 135–145)
Total Bilirubin: 0.7 mg/dL (ref 0.3–1.2)
Total Protein: 6.4 g/dL (ref 6.0–8.3)

## 2010-11-15 LAB — PROTIME-INR
INR: 0.92 (ref 0.00–1.49)
Prothrombin Time: 12.6 seconds (ref 11.6–15.2)

## 2010-11-15 LAB — GLUCOSE, CAPILLARY: Glucose-Capillary: 297 mg/dL — ABNORMAL HIGH (ref 70–99)

## 2010-11-15 LAB — APTT: aPTT: 26 seconds (ref 24–37)

## 2010-11-15 LAB — POCT CARDIAC MARKERS
CKMB, poc: <1 ng/mL — ABNORMAL LOW (ref 1.0–8.0)
Myoglobin, poc: 53.7 ng/mL (ref 12–200)
Troponin i, poc: <0.05 ng/mL (ref 0.00–0.09)

## 2010-11-18 NOTE — Discharge Summary (Addendum)
[name], [name]                 ACCOUNT NO.:  [id number]  MEDICAL RECORD NO.:  [id number]           PATIENT TYPE:  I  LOCATION:  [id number]                         FACILITY:  [facility]  PHYSICIAN:  Nelda Severe. Kellie Simmering, M.D.  DATE OF BIRTH:  08/05/1956  DATE OF ADMISSION:  11/10/2010 DATE OF DISCHARGE:  11/12/2010                              DISCHARGE SUMMARY   CHIEF COMPLAINT:  Symptomatic left carotid stenosis.  HISTORY OF ILLNESS:  [name] is a 56 year old gentleman referred by Dr. Marcial Pacas as having had some visual symptoms in December 2011, which he describes as color changes in his left visual field, but not total blindness.  Initially these were intermittent, but now they are almost constant.  Wife states he has had some episodes of slurred speech lasting a few minutes, but no episodes of hemiparesis.  He has no history of previous stroke.  Angiogram showed a very tight left internal carotid artery stenosis with no right internal carotid occlusive disease.  There is a string sign which is very diminished in flow to the base of the skull on the left side.  It was felt that the patient should have a left carotid endarterectomy.  PAST MEDICAL HISTORY: 1. Diabetes. 2. Hypertension. 3. Hyperlipidemia. 4. COPD. 5. Degenerative joint disease. 6. Previous lumbar laminectomy on 3 occasions and 2 cervical spine     operations. 7. GERD. 8. Negative for coronary artery disease or stroke.  HOSPITAL COURSE:  The patient was taken to the operating room on November 10, 2010, for a left carotid endarterectomy with Dacron patch angioplasty.  Postoperatively, the patient did well; however, he had some substernal chest pain, which was somewhat relieved with antacids. All his cardiac enzymes were negative and he was cleared by Cardiology to go home.  EKG remained stable as well.  The patient neurologically was intact.  He had no tongue deviation or facial droop.  He was alert and oriented x3.  He  had good and equal strength bilateral upper and lower extremities.  He was ambulating, voiding, and taking p.o.  He was discharged on November 12, 2010.  He will follow up with Dr. Kellie Simmering in 2 weeks.  FINAL DIAGNOSIS: 1. Critical symptomatic left carotid stenosis status post left carotid     endarterectomy. 2. Substernal chest pain, probably gastrointestinal in nature as his     cardiac enzymes and EKG were unchanged.  All of his other chronic medical issues were stable and treated with his preoperative medications.  DISPOSITION:  The patient was discharged to home.  He will follow up with Dr. Kellie Simmering in 2 weeks and he will follow up with his primary care doctor as instructed.  DISCHARGE MEDICATIONS: 1. Lovaza 4 capsules daily. 2. Prevacid 30 mg daily. 3. Aspirin 81 mg daily. 4. Glyburide/metformin 2.5/500 b.i.d. 5. Onglyza 5 mg daily. 6. Trilipix 135 mg daily. 7. Xanax 0.5 mg twice daily. 8. Reglan 5 mg four times a day. 9. ReQuip 2 mg daily. 10.Prozac 20 mg daily. 11.Neurontin 300 mg three times a day. 12.Lantus 100 units at bedtime. 13.Tylox 5/500 one tablet every 4 hours  as needed for pain, #20 were     given. 14.Lidoderm patch daily as needed. 15.Lasix 20 mg daily. 16.Plavix 75 mg daily. 17.Ultram 50 mg 1-3 tablets q.6 h. p.r.n. pain. 18.Carvedilol 12.5 mg twice daily. 19.Exforge 10/320 mg daily. 20.Potassium chloride 10 mEq b.i.d. 21.Lipitor 20 mg daily.     Harold Kearns, PA-C   ______________________________ Nelda Severe Kellie Simmering, M.D.    RR/MEDQ  D:  11/13/2010  T:  11/14/2010  Job:  [id number]  Electronically Signed by Tinnie Gens M.D. on 11/27/2010 01:44:46 PM Electronically Signed by Harold Kearns PA on 11/28/2010 11:21:00 AM

## 2010-11-20 LAB — POCT CARDIAC MARKERS
CKMB, poc: <1 ng/mL — ABNORMAL LOW (ref 1.0–8.0)
CKMB, poc: <1 ng/mL — ABNORMAL LOW (ref 1.0–8.0)
Myoglobin, poc: 56.6 ng/mL (ref 12–200)
Myoglobin, poc: 59.7 ng/mL (ref 12–200)
Troponin i, poc: <0.05 ng/mL (ref 0.00–0.09)
Troponin i, poc: <0.05 ng/mL (ref 0.00–0.09)

## 2010-11-20 LAB — LIPASE, BLOOD: Lipase: 17 U/L (ref 11–59)

## 2010-11-20 LAB — CBC
HCT: 41.7 % (ref 39.0–52.0)
Hemoglobin: 15.4 g/dL (ref 13.0–17.0)
MCH: 30.7 pg (ref 26.0–34.0)
MCHC: 36.9 g/dL — ABNORMAL HIGH (ref 30.0–36.0)
MCV: 83.2 fL (ref 78.0–100.0)
Platelets: 173 10*3/uL (ref 150–400)
RBC: 5.01 MIL/uL (ref 4.22–5.81)
RDW: 12.4 % (ref 11.5–15.5)
WBC: 6.6 10*3/uL (ref 4.0–10.5)

## 2010-11-20 LAB — DIFFERENTIAL
Basophils Absolute: 0 10*3/uL (ref 0.0–0.1)
Basophils Relative: 1 % (ref 0–1)
Eosinophils Absolute: 0.1 10*3/uL (ref 0.0–0.7)
Eosinophils Relative: 1 % (ref 0–5)
Lymphocytes Relative: 13 % (ref 12–46)
Lymphs Abs: 0.9 10*3/uL (ref 0.7–4.0)
Monocytes Absolute: 0.9 10*3/uL (ref 0.1–1.0)
Monocytes Relative: 13 % — ABNORMAL HIGH (ref 3–12)
Neutro Abs: 4.8 10*3/uL (ref 1.7–7.7)
Neutrophils Relative %: 72 % (ref 43–77)

## 2010-11-20 LAB — URINALYSIS, ROUTINE W REFLEX MICROSCOPIC
Bilirubin Urine: NEGATIVE
Glucose, UA: >1000 mg/dL — AB
Hgb urine dipstick: NEGATIVE
Ketones, ur: NEGATIVE mg/dL
Leukocytes, UA: NEGATIVE
Nitrite: NEGATIVE
Protein, ur: 100 mg/dL — AB
Specific Gravity, Urine: 1.013 (ref 1.005–1.030)
Urobilinogen, UA: 0.2 mg/dL (ref 0.0–1.0)
pH: 6 (ref 5.0–8.0)

## 2010-11-20 LAB — BASIC METABOLIC PANEL
BUN: 4 mg/dL — ABNORMAL LOW (ref 6–23)
CO2: 26 mEq/L (ref 19–32)
Calcium: 8.8 mg/dL (ref 8.4–10.5)
Chloride: 97 mEq/L (ref 96–112)
Creatinine, Ser: 0.68 mg/dL (ref 0.4–1.5)
GFR calc Af Amer: >60 mL/min (ref >60)
GFR calc non Af Amer: >60 mL/min (ref >60)
Glucose, Bld: 214 mg/dL — ABNORMAL HIGH (ref 70–99)
Potassium: 3.2 mEq/L — ABNORMAL LOW (ref 3.5–5.1)
Sodium: 133 mEq/L — ABNORMAL LOW (ref 135–145)

## 2010-11-20 LAB — HEPATIC FUNCTION PANEL
ALT: 10 U/L (ref 0–53)
AST: 17 U/L (ref 0–37)
Albumin: 3.7 g/dL (ref 3.5–5.2)
Alkaline Phosphatase: 126 U/L — ABNORMAL HIGH (ref 39–117)
Bilirubin, Direct: 0.2 mg/dL (ref 0.0–0.3)
Indirect Bilirubin: 0.4 mg/dL (ref 0.3–0.9)
Total Bilirubin: 0.6 mg/dL (ref 0.3–1.2)
Total Protein: 6.4 g/dL (ref 6.0–8.3)

## 2010-11-20 LAB — URINE MICROSCOPIC-ADD ON

## 2010-11-20 NOTE — Consult Note (Signed)
  [name], [name]                 ACCOUNT NO.:  [id number]  MEDICAL RECORD NO.:  [id number]           PATIENT TYPE:  E  LOCATION:  [facility]                         FACILITY:  [facility]  PHYSICIAN:  Nelda Severe. Kellie Simmering, M.D.  DATE OF BIRTH:  1956-08-05  DATE OF CONSULTATION: DATE OF DISCHARGE:  11/15/2010                                CONSULTATION   Mr. Valade underwent left carotid endarterectomy by me on November 10, 2010 for a 99% left internal carotid stenosis.  He has known hypertension managed by Dr. Terance Ice and postoperatively, his blood pressure was under reasonable control, but since discharge, it has gotten up as high as 190 according to the family.  He has also had some frontal headaches, but no specific neurologic symptoms.  He has had some nausea and vomiting and some discomfort in the left neck.  He came to the emergency department today to be evaluated.  On exam today, his temperature 98.7, blood pressure 149/66, heart rate 62, respirations 18.  He has mild-to-moderate swelling in the left neck thought to be secondary to his hypertension and his Plavix.  Neurologic exam is normal.  He has 3+ carotid pulses bilaterally with no bruits audible.  CT scan of the head was ordered to rule out an intracerebral bleed because of the subtotal occlusion, which he had preoperatively, which was corrected.  CT of the head was normal.  No evidence of hemorrhage or other abnormalities.  I talked to Dr. Lowella Fairy nurse today who will be seeing the patient tomorrow to continue to help monitor the blood pressure management.  Blood pressure repeated, it was 146/62.  He was given a prescription for Phenergan for nausea and if he has further problems with the blood pressure, notify Dr. Lowella Fairy office, will return to see me in 2 weeks as scheduled.     Nelda Severe Kellie Simmering, M.D.     JDL/MEDQ  D:  11/15/2010  T:  11/16/2010  Job:  [id number]  Electronically Signed by Tinnie Gens M.D.  on 11/20/2010 10:47:06 AM

## 2010-11-21 NOTE — Consult Note (Signed)
[name]                 ACCOUNT NO.:  [id number]  MEDICAL RECORD NO.:  [id number]           PATIENT TYPE:  I  LOCATION:  [id number]                         FACILITY:  [facility]  PHYSICIAN:  Tery Sanfilippo, MD     DATE OF BIRTH:  04/30/1956  DATE OF CONSULTATION:  11/11/2010 DATE OF DISCHARGE:                                CONSULTATION   REQUESTING PHYSICIAN:  Nelda Severe. Kellie Simmering, MD  REASON FOR CONSULTATION:  Chest pain postoperatively.  HISTORY OF PRESENT ILLNESS:  [name] is a pleasant 55 year old gentleman who was admitted electively yesterday for a left carotid endarterectomy.  He is postoperative day #1.  Last night following his carotid endarterectomy, he did have an episode of chest discomfort that was burning in sensation, relieved a little bit with antacid, but it is certainly very significant, felt like someone was cutting him within his chest.  He feels well today and he has no more chest pain.  His 12-lead ECG has new T-wave inversion inferiorly and in the lateral leads with T- wave inversion V4 through V6 which prior ECGs from December 2011, the ECG showed the same T-wave changes laterally, but no evidence of any T- wave changes inferiorly.  He therefore presents today with chest pain and new T-wave changes inferiorly.  His cardiac enzyme panel is entirely normal with a negative CK, CK-MB, and negative troponin.  He remains well.  However, he has had a negative nuclear perfusion stress test in our office dated November 08, 2010, with normal LV function.  His risk factors of coronary artery disease include significant smoker with dyslipidemia, on medication and significant hypertension.  PAST MEDICAL HISTORY: 1. Multiple back and neck surgeries in the past, back surgery x2 with     anterior fusion. 2. Status post left ICA endarterectomy yesterday. 3. Dyslipidemia. 4. Hypertension. 5. Diabetes.  CURRENT MEDICATIONS: 1. Norvasc 10 mg daily. 2. Benicar 40  mg daily. 3. Fenofibrate 160 mg daily. 4. Coreg 12.5 mg p.o. b.i.d. 5. Lipitor 10 mg daily. 6. Aspirin 325 mg daily. 7. Prozac 20 mg daily. 8. Neurontin 600 mg t.i.d. 9. Xanax 0.25 mg b.i.d. 10.Protonix 40 mg daily. 11.Lovaza 2 g daily. 12.Reglan 5 mg q.i.d. 13.Lasix 20 mg daily. 14.K-Dur 10 mEq b.i.d. 15.Onglyza 5 mg daily. 16.Plavix 75 mg daily. 17.Glucophage 1000 mg b.i.d. 18.Micronase 5 mg b.i.d. 19.NovoLog insulin at bedtime. 20.Lantus 50 units at bedtime. 21.ReQuip 1 mg at bedtime.  ALLERGIES:  No known drug allergies.  SOCIAL HISTORY:  He is a heavy smoker and continues to smoke a pack a day.  He is active, drives.  He does not do any regular exercise.  REVIEW OF SYSTEMS:  Unremarkable.  PHYSICAL EXAMINATION:  GENERAL:  A well-looking individual, not in acute distress.  He has left carotid endarterectomy scar. VITAL SIGNS:  His heart rate was 44 sinus rhythm.  He is afebrile at 97.6, blood pressure 106/61, and respiratory rate was 10 with 100% O2 saturation. HEENT:  Head is atraumatic and normocephalic. NECK:  Supple.  Full range of movement.  No jugular venous distention, carotid bruits, or thyromegaly. NEUROLOGIC:  Cranial nerves II through XII normal.  PERRLA.  No focal deficits. MUSCULOSKELETAL:  No focal deficits. CARDIOVASCULAR:  No heaves or thrills.  Heart sounds 1 and 2 are heard. No murmurs, rubs, or gallops. LUNGS:  Clear to auscultation bilaterally.  Percussion resonant throughout.  No crepitus or  wheezes noted. EXTREMITIES:  Pedal pulses 2+.  No edema.  PLAN:  We will keep him on overnight.  We will cycle his cardiac enzymes.  We will repeat 12-lead ECG tomorrow morning.  If there is no change in his 12-lead ECG and his cardiac enzymes remain within normal limits, he will be safe for discharge home.  Most likely, the chest pain will remain to be considered as gastrointestinal.     Tery Sanfilippo, MD     HS/MEDQ  D:  11/11/2010  T:   11/12/2010  Job:  [id number]  Electronically Signed by Tery Sanfilippo MD on 11/21/2010 01:06:40 PM

## 2010-11-28 ENCOUNTER — Ambulatory Visit: Payer: BC Managed Care – HMO | Admitting: Vascular Surgery

## 2010-12-08 ENCOUNTER — Ambulatory Visit (INDEPENDENT_AMBULATORY_CARE_PROVIDER_SITE_OTHER): Payer: BC Managed Care – HMO

## 2010-12-08 DIAGNOSIS — I6529 Occlusion and stenosis of unspecified carotid artery: Secondary | ICD-10-CM

## 2010-12-12 ENCOUNTER — Ambulatory Visit (INDEPENDENT_AMBULATORY_CARE_PROVIDER_SITE_OTHER): Payer: BC Managed Care – HMO | Admitting: Vascular Surgery

## 2010-12-12 DIAGNOSIS — I6529 Occlusion and stenosis of unspecified carotid artery: Secondary | ICD-10-CM

## 2010-12-13 NOTE — Assessment & Plan Note (Signed)
OFFICE VISIT  Beckford, Imraan L DOB:  10/26/1955                                       12/12/2010 [id number]  The patient returns 4 weeks post-left carotid endarterectomy for severe subtotal occlusion of his left internal carotid artery with left hemispheric TIAs.  He did have visual changes in his left eye as well as dizziness preoperatively.  This has not recurred since his surgery.  He tolerated the procedure well.  His biggest problem has been blood pressure control which is being managed by Dr. Rollene Fare and Dr. Wilson Singer. He did have some mild erythema in the left neck and was seen in our office and also by Dr. Rollene Fare last week who started him on some Keflex.  He has had no chills, fever, or pertinent drainage.  On exam today, the incision is healing nicely.  There is one small area in the midportion that has some minimal erythema, but this is resolving it appears.  He has 3+ pulses with no audible bruits.  His neurologic exam is normal.  He has 3+ femoral and dorsalis pedis pulses palpable bilaterally.  Generally, he is doing well.  He will continue taking 1 aspirin tablet 81 mg b.i.d.  He will return in 6 months for a followup carotid duplex exam in our office to assess this further.  He apparently is being evaluated by Dr. Wilson Singer for lower extremity occlusive disease, but he does not appear to have significant occlusive disease since he has 3 to 4+ pulse palpable in both feet.  If he has further symptoms suggestive of TIA or stroke in the interim, he will be in touch with me.    Nelda Severe Kellie Simmering, M.D. Electronically Signed  JDL/MEDQ  D:  12/12/2010  T:  12/13/2010  Job:  [id number]

## 2011-01-02 ENCOUNTER — Ambulatory Visit (INDEPENDENT_AMBULATORY_CARE_PROVIDER_SITE_OTHER): Payer: Medicare Other | Admitting: Vascular Surgery

## 2011-01-02 DIAGNOSIS — I70219 Atherosclerosis of native arteries of extremities with intermittent claudication, unspecified extremity: Secondary | ICD-10-CM

## 2011-01-03 NOTE — Assessment & Plan Note (Signed)
OFFICE VISIT  [name], [name] DOB:  12/17/1955                                       01/02/2011 [id number]  Patient is a patient well-known to me, having undergone a left carotid endarterectomy on March 2 for subtotal occlusion of his left internal carotid artery with left brain TIAs.  He was completely relieved of his left visual symptoms following his surgery.  He also has been complaining of heaviness in both lower extremities which occurs frequently at night and also during the day with ambulation or at rest. It seems to worsen if he is climbing up inclines and involves the entire aspect of both legs.  He was recently evaluated by Dr. Wilson Singer, who obtained lower extremity arterial Dopplers at [facility].  I have reviewed these, and the ABI on the right is normal at 1.18 and on the left is only mildly abnormal at 0.75.  The patient complains of equal symptoms in both legs, which are not consistent with classic lower extremity claudication symptoms.  He does have numbness in his feet at times, though to be due to peripheral neuropathy.  He also has an extensive history of degenerative joint disease with 3 lumbar laminectomy-type procedures with a spinal fusion in his lumbar spine as well as cervical spine fusion and a right knee replacement.  PHYSICAL EXAMINATION:  Blood pressure is 216/108, heart rate 95, respirations 20.  Left carotid incision is well-healed with 3+ pulse and no bruits.  Neurologic:  Normal.  Lower extremity exam reveals 3+ femoral, 3+ popliteal, and 3+ dorsalis pedis pulses bilaterally with warm, well-perfused lower extremities.  These physical findings are the same as when I saw the patient 3 weeks ago in his postoperative visit.  He has well-perfused lower extremities, and I do not think arterial insufficiency is causing any of his symptoms.  Most likely it would be some spinal stenosis or something related to  his lumbar spine causing this since his pulses are normal and his pressures are certainly excellent.  He will return in 5 months for his scheduled carotid follow-up.    Nelda Severe Kellie Simmering, M.D. Electronically Signed  JDL/MEDQ  D:  01/02/2011  T:  01/03/2011  Job:  [id number]  cc:   Ronaldo Miyamoto, M.D. Rennis Harding, M.D.

## 2011-01-23 NOTE — Op Note (Signed)
[name], [name]                 ACCOUNT NO.:  [id number]   MEDICAL RECORD NO.:  [id number]          PATIENT TYPE:  AMB   LOCATION:  DAY                          FACILITY:  [facility]   PHYSICIAN:  Marland Kitchen T. Hoxworth, M.D.DATE OF BIRTH:  [birth year]   DATE OF PROCEDURE:  10/10/2007  DATE OF DISCHARGE:                               OPERATIVE REPORT   PREOPERATIVE DIAGNOSES:  1. Left inguinal hernia.  2. Umbilical hernia.   POSTOPERATIVE DIAGNOSES:  1. Left inguinal hernia.  2. Umbilical hernia.   SURGICAL PROCEDURES:  1. Repair of left inguinal hernia with mesh.  2. Repair of umbilical hernia with mesh.   SURGEON:  Darene Lamer. Hoxworth, M.D.   ANESTHESIA:  General.   BRIEF HISTORY:  [name] is a 55 year old male with diabetes and  other medical problems who presents with a long history of gradually  increasingly symptomatic left inguinal hernia and umbilical hernia  confirmed by exam. We discussed the options for repair and elected to  proceed with open repair with mesh as an outpatient.  The nature of the  procedure, indications, risks of bleeding, infection, anesthetic  complications and recurrence were discussed and understood. He is now  brought to the operating room for these procedures.   DESCRIPTION OF OPERATION:  The patient was brought to the operating  room, placed in the supine position on the operating  table and general  endotracheal anesthesia was induced.  The abdomen, groin and perineum  were widely sterilely prepped and draped.  He received preoperative IV  antibiotics. The correct patient and procedure were verified.  The left  inguinal hernia was approached first. An oblique incision was made and  dissection carried down through the subcutaneous tissue using cautery.  The external oblique was identified and divided along the lines of its  fibers through the external ring.  The cord was dissected up off the  floor at the pubic tubercle and cremasteric  fibers were divided  bilaterally up to the internal ring, mobilizing the cord.  The direct  space was intact.  There was a dilated internal ring with to obvious  tissue coming through this.  Initially a cord lipoma was dissected way  from cord structures up to and above the level of the internal ring,  divided,  ligated and removed.  Dissection within the cord then revealed  a hernia sac that was dissected free from cord structures up to and  above the level of the internal ring.  At obviously contained tissue  when I opened this and there was a sliding component possibly part of  the sigmoid colon.  I closed the hernia sac and then it was further  dissected well up above the internal ring and then completely reduced  into the abdominal cavity.  The internal ring was then snugged with a  couple of 2-0 Prolene sutures down to normal diameter.  A piece of  Parietex mesh was then trimmed to size to fit the floor of the inguinal  canal with tails around the cord in the internal ring.  It was sutured  initially with the pubic tubercle and then to the inguinal ligament,  iliopubic tract with running 2-0 Prolene.  Laterally the mesh was  sutured to the edge of the rectus sheath with interrupted 2-0 Prolene.  The tails were then tacked together lateral to the cord with interrupted  Prolene creating a new internal ring snug to a fingertip.  The cord  returned to its anatomic position.  The soft tissue was infiltrated with  Marcaine.  The external oblique was closed over this with running 2-0  Vicryl. The subcu was closed with running 3-0 Vicryl and  skin closed with running subcuticular 4-0 Monocryl and Dermabond.  The  umbilical hernia was approached next.  An infraumbilical curvilinear  incision was used and dissection carried down through the subcutaneous  tissue.  Dissection was carried down to the fascial level. The umbilical  skin was dissected up off the hernia sac.  The hernia sac was  excised  and contained omentum which was reduced. The skin and subcu was raised  off the fascia for 2-3 cm in all directions.  The defect measured about  2 cm.  It was closed primarily transversely with inverting #0 Prolene  sutures.  An onlay patch of Parietex mesh was then used to about 5 cm in  diameter, at least a couple centimeters back from all edges and sutured  at its periphery with interrupted 2-0 Prolene.  The soft tissue was  infiltrated with Marcaine.  The subcu was closed with interrupted 3-0  Vicryl bringing the umbilical skin down to the fascial level.  The skin  was closed with running subcuticular 4-0 Monocryl and Dermabond. Sponge,  needle and instrument counts  correct.  The patient was taken to  recovery in good condition.      Darene Lamer. Hoxworth, M.D.  Electronically Signed     BTH/MEDQ  D:  10/10/2007  T:  10/10/2007  Job:  [id number]

## 2011-01-23 NOTE — Op Note (Signed)
NAMECARDER, BELOW                 ACCOUNT NO.:  [id number]   MEDICAL RECORD NO.:  [id number]          PATIENT TYPE:  AMB   LOCATION:  ENDO                         FACILITY:  [facility]   PHYSICIAN:  Waverly Ferrari, M.D.    DATE OF BIRTH:  12-14-1955   DATE OF PROCEDURE:  DATE OF DISCHARGE:                               OPERATIVE REPORT   PROCEDURE:  Upper endoscopy with biopsy.   INDICATIONS:  GERD.   ANESTHESIA:  Fentanyl 125 mcg, Versed 7 mg.   PROCEDURE:  With the patient mildly sedated in the left lateral  decubitus position, the Pentax videoscopic endoscope was inserted in the  mouth and passed under direct vision through the esophagus, which  appeared normal until we reached the distal esophagus and I saw at least  2 small islands of what I thought was Barrett esophagus.  I photographed  it and tried to biopsy it but with some difficulty because of  overlapping mucosa covered the area but I biopsied it in that area of  what I saw was an [place] of Barrett's and then advanced into the stomach  fundus, body, antrum, duodenal bulb, second portion of the duodenum were  visualized.  From this point the endoscope was slowly withdrawn, taking  circumferential views of the duodenal mucosa until the endoscope had  been pulled back into the stomach, placed in retroflexion to view the  stomach from below.  The endoscope was then straightened and withdrawn,  taking circumferential views of the remaining gastric and esophageal  mucosa.  The patient's vital signs, pulse oximeter remained stable.  The  patient tolerated the procedure well without apparent complication.   FINDINGS:  Question of Barrett esophagus, biopsied.  Await biopsy  report.  Of note, there was food remaining in the stomach from eating  just before midnight, so there is some degree of gastroparesis.   PLAN:  Await biopsy report.  The patient will call me for results and  follow up with me as needed as an outpatient.           ______________________________  Waverly Ferrari, M.D.     GMO/MEDQ  D:  08/10/2008  T:  08/10/2008  Job:  [id number]

## 2011-01-26 NOTE — Discharge Summary (Signed)
[place]  Patient:    [name]                      MRN: [id number] Adm. Date:  [id number] Disc. Date: [id number] Attending:  Epifanio Lesches Dictator:   Alroy Bailiff, P.A. CC:         [name]. [name], M.D.             [name], M.D.                           Discharge Summary  ADMISSION DIAGNOSES: 1. Presyncope. 2. Unstable angina. 3. History of hernia repair. 4. Status post right rotator cuff repair. 5. History of back surgery x 2 with anterior cervical spine fusion in late 2000, by    Dr. Rodell [name]. 6. History of hiatal hernia. 7. Non-insulin-dependent diabetes mellitus for five years. 8. Pilonidal cyst surgery by Dr. Maia [name]. [place].  DISCHARGE DIAGNOSES: 1. Presyncope. 2. Unstable angina. 3. History of hernia repair. 4. Status post right rotator cuff repair. 5. History of back surgery x 2 with anterior cervical spine fusion in late 2000, by    Dr. Rodell [name]. 6. History of hiatal hernia. 7. Non-insulin-dependent diabetes mellitus for five years. 8. Pilonidal cyst surgery by Dr. Maia [name]. Arkin. 9. Status post cardiac catheterization by Dr. Terance [name] on October 12, 1999, with normal coronary arteries and normal left ventricular function.  HISTORY OF PRESENT ILLNESS:  [name] is a 55 year old male who presented to he emergency room on October 12, 1999, with multiple complaints including "feeling  like I would black out," a feeling which had been especially prominent when lying, chest discomfort of unpredictable onset and unpredictable duration, using lasting 5-30 minutes, possibly relieved by breathing hard, and frequent episodes of marked pallor witnessed by his wife.  As well, he had had nausea and dyspnea.  As well, he also had dizziness which had been going on for several weeks.  His primary care  physician was Dr. Steffanie [name], who had referred him to Dr. Rollene [name] because of the  thought that this symptom complex might be cardiac in origin.  Of note, the  patient had had cardiac catheterizations by Dr. Rollene [name] at age 89 and age 33 or chest pain.  Both of those tests had been normal.  He had also been hospitalized by Dr. Jenkins [name] around December 1998, and had noninvasive test, which also showed no significant ischemia.  At that time, he was hemodynamically stable with a blood pressure of 150/80, and had a pulse of 76.  At that time, his EKG revealed sinus rhythm with nonspecific ST abnormalities.  At that time, he was seen by Dr. Pauline [name], who was on call.  It was felt that he had multiple risk factors for coronary disease, including male sex, heavy smoking, non-insulin-dependent diabetes mellitus, and hyperlipidemia.  At that time, he as planned to be admitted to rule out myocardial infarction.  It would be determined by Dr. Rollene [name] whether to proceed with additional invasive or noninvasive testing.  As well, CT scan of the lung was ordered to rule out pulmonary embolus. He would be admitted to a telemetry bed, placed on IV nitroglycerin, and continued on his previous home medications.  HOSPITAL COURSE:  On the morning of October 12, 1999, [name] was without complaints.  He had remained hemodynamically stable.  His  initial cardiac enzymes were all negative.  EKG on that morning continued to show sinus rhythm with nonspecific changes.  At that time, he was on aspirin, Plavix, Lipitor, Hyzaar, and Zaroxolyn.  As well, he was on IV nitroglycerin.  At that time, he was planned or cardiac catheterization later that day for evaluation of his coronaries.  On October 12, 1999, [name] underwent cardiac catheterization by Dr. Terance [name].  He was found to have normal coronary arteries and normal LV function. As well, there was no significant MR.  He tolerated the procedure well, and Dr. Rollene [name] used the Perclose technique.  On the  morning of October 13, 1999, [name] had remained stable.  He was afebrile at 96.6, pulse of 65, blood pressure 120/71, oxygen saturations 96% on  2 L.  His lungs were clear.  His heart remained in regular rhythm.  His groin was stable, with no hematoma or bleed.  He was felt to be stable for discharge home at this time.  CONSULTATIONS:  None.  PROCEDURES:  Cardiac catheterization by Dr. Terance [name] on October 12, 1999, revealing normal coronary arteries and normal LV function.  LABORATORY DATA:  Lipid profile revealed cholesterol 245, triglycerides 1393, HDL 28, LDL not calculated.  Hemoglobin A1C was 8.3.  Cardiac enzymes revealed CKs f 138 and 87.  CK-MBs were 0.7 and 0.3.  Relative index was 0.5.  Troponin was 0.03. Metabolic profile was essentially normal with sodium 134, potassium 3.8, glucose 170, BUN 11, creatinine 0.7.  CBC revealed WBC 9.2, hemoglobin 16.3, hematocrit  42.5, and platelets 233.  Radiology:  Chest CT on October 12, 1999, revealed negative CT of the chest for  DVT.  No acute infiltrate or effusion.  Negative CT of the extremities and pelvis for DVT.  Chest x-ray on October 11, 1999, revealed no active lung disease.  EKG on October 11, 1999, revealed normal sinus rhythm at 88 beats per minute with nonspecific ST-T changes.  DISCHARGE MEDICATIONS: 1. Zaroxolyn 5 mg once a day. 2. Zyban 150 mg twice a day. 3. Glyburide ______ 3 mg once a day. 4. Aspirin 81 mg once a day. 5. Hyzaar 50/12.5 once a day. 6. Lipitor 20 mg once a day. 7. Prevacid 30 mg once a day. 8. Lopressor 25 mg twice a day.  DISCHARGE INSTRUCTIONS: 1. No strenuous activity greater than 11 pounds, driving, or sexual activity for    three days. 2. Low salt, low fat diet. 3. May gently wash the groin with warm water and soap. 4. Call the office at [phone] if any bleeding or increased size or pain in the    groin. 5. Make an appointment to see Dr. Doug [name] in one to two  weeks.  DD:  11/02/99 TD:  11/03/99 Job: [id number]

## 2011-01-26 NOTE — Op Note (Signed)
[name], [name]                 ACCOUNT NO.:  [id number]   MEDICAL RECORD NO.:  [id number]          PATIENT TYPE:  INP   LOCATION:  2021                         FACILITY:  [facility]   PHYSICIAN:  [name], M.D.    DATE OF BIRTH:  [birth year]   DATE OF PROCEDURE:  11/01/2004  DATE OF DISCHARGE:                                 OPERATIVE REPORT   PROCEDURE:  Endoscopic retrograde cholangiopancreatography with  sphincterotomy and balloon pull through.   INDICATION:  Questionable CBD stone in a patient with pancreatitis, elevated  liver test.  Consent was signed after risks, benefits, methods, options  thoroughly discussed by both Dr. Lajoyce Corners and myself prior to any premeds given.   MEDICINES USED:  1.  Demerol 70.  2.  Versed 6.   PROCEDURE:  Side viewing video therapeutic duodenoscope was inserted by  indirect vision into the stomach.  Some old bilious material was seen and  suctioned and advanced through the antrum and pylorus into the periampullary  region.  There was a small periampullary diverticula, the ampulla appeared  normal.  Using the triple lumen sphincterotome, we initially cannulated the  PD.  Once we realized we were injecting the PD, one pancreatogram was  obtained, it was not overfilled, the PD appeared normal.  The sphincterotome  was repositioned.  We did inject just a touch of dye a few times into the  pancreas but stopped as soon as we realized we were injecting towards the  pancreas but we were able to get selective cannulation using the JAG jag  wire and deep selective cannulation was obtained.  The CBD was filled as was  the left intrahepatic, both of which were normal, no cystic duct filling was  seen.  We did not fill the right side of the system very well.  Based on his  elevated liver test and quickly to resolve pancreatitis, we went ahead and  proceeded with a small to medium sized sphincterotomy until we had adequate  biliary drainage, until we were able  to easily get the three-quarters mode  sphincterotome in and out of the duct.  We went ahead and exchanged the  sphincterotome for the 8.5 mm balloon and proceeded with three balloon pull  throughs without any stone, debris or sludge.  On the last balloon pull  through we did an occlusion cholangiogram which did not reveal any  additional findings.  We elected to stop the procedure at this junction.  The scope was removed.  Patient tolerated the procedure well.  There was no  obvious immediate complications.   ENDOSCOPIC DIAGNOSES:  1.  Periampullary diverticula, small.  2.  Normal PD not overfilled on only a few injections.  3.  Normal common bile duct and normal left intrahepatic, right side not      much filling.  4.  No cystic duct filling.  5.  Otherwise normal status post small to medium sized sphincterotomy with      three 8.5 mm balloon pull throughs and a negative occlusion      cholangiogram.   PLAN:  1.  Observe for delayed complications.  2.  Will hold aspirin and Lovenox today because of the procedure, but would      re-evaluate whether he needs either in the future, and if no delayed      complications will slowly advance diet tomorrow and follow labs as well.      MEM/MEDQ  D:  11/01/2004  T:  11/01/2004  Job:  [id number]   cc:   [name], M.D.  [address] [place] [address]  Fax: [phone]   Ronaldo Miyamoto, M.D.  [address] [place]  [place] [address]  Fax: [place]. Rollene Fare, M.D.  [phone] [address], [address] [place] [address]  Fax: [phone]   Waverly Ferrari, M.D.  [address] [place]  [place] [address]  Fax: [phone]

## 2011-01-26 NOTE — Consult Note (Signed)
[name], [name]                 ACCOUNT NO.:  [id number]   MEDICAL RECORD NO.:  [id number]          PATIENT TYPE:  INP   LOCATION:  [id number]                         FACILITY:  [facility]   PHYSICIAN:  Barbette Merino, M.D.      DATE OF BIRTH:  [birth year]   DATE OF CONSULTATION:  11/15/2006  DATE OF DISCHARGE:                                 CONSULTATION   PRIMARY CARE PHYSICIAN:  Ronaldo Miyamoto, M.D.   ORTHOPEDIC SURGEON:  [name]. [name], M.D.   REASON FOR CONSULTATION:  Diabetic management.   CONSULT REQUESTED:  By [name] [name], M.D.   HISTORY OF PRESENT ILLNESS:  The patient is a 55 year old white male  with multiple medical problems including insulin-dependent diabetes,  osteoarthrosis, status post multiple surgeries.  He was admitted to the  hospital by Dr. 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  The patient had extensive  synovectomy of the right knee.  There was also review of his total knee  replacement.  Postoperatively the patient's sugar has risen to 300+,  hence we were consulted for further management.  His last sugar when  checked was 211.  The patient denied any specific symptoms except for  the pain.  He has been taking all his medications effectively until he  came in.   PAST MEDICAL HISTORY:  Extensive including insulin-dependent diabetes  mellitus, osteoarthritis with cervical fusion and lower lumbar  degenerative disk in the past, dyslipidemia, hypertension, restless leg  syndrome, history of GERD with Barrett's esophagitis, history of hiatal  hernia, history of coronary artery disease, history of chronic headaches  related to his cervical degenerative disease, and morbid obesity.   ALLERGIES:  The patient has no known drug allergies.   MEDICATIONS EXTENSIVE INCLUDING:  1. Lovasa 1 tablet q.i.d.  2. Zetia 10 mg daily.  3. Neurontin 300 mg daily.  4. Prevacid 30 mg daily.  5. Actos 45 mg daily.  6.  Toprol XL 50 mg daily.  7. Mobic 50 mg daily.  8. Lantus 50 units q.h.s. subcutaneously.  9. Aspirin 81 mg daily.  10.Prozac 20 mg daily.  11.Glucovance 500 one tablet in the morning.  12.Tricor 145 mg daily.  13.Avapro 150 mg daily.  14.Reglan 5 mg q.i.d.  15.Norvasc 5 mg daily.  16.Requip 2 mg daily.  17.Xanax 0.25 mg daily.  18.Lipitor 80 mg daily.  19.Question of Crestor 20 mg daily.  20.Tylox 5 mg q.4 h. p.r.n. pain.   PAST SURGICAL HISTORY INCLUDES:  1. History of back surgery x3.  2. History of cervical surgery.  3. Right knee replacement.  4. Right shoulder surgery.  5. Status post colon cholecystectomy.  6. Status post hernia repair.  7. Status post penile implant.   SOCIAL HISTORY:  The patient is married, but currently on disability.  Smokes 1 pack per day, occasional alcohol.  The patient has 3 grown up  children.   FAMILY HISTORY:  Father died from complication of diabetes, colon  cancer, and coronary artery disease.  His mother also died from  a brain  tumor and pneumonia.  The rest of the family history is a strong history  of heart disease, diabetes, colon cancer, strokes, etcetera.   REVIEW OF SYSTEMS:  A 12-point review of systems is mainly per HPI.   PHYSICAL EXAMINATION:  VITAL SIGNS:  Temperature 97.3, blood pressure  157/113, pulse 77, respiratory rate 18, he is saturating 97% on 2  liters.  GENERAL:  The patient is an awake, alert, oriented, obese patient in no  acute distress.  HEENT:  PERRL.  EOMI.  NECK:  Supple.  No JVD, no lymphadenopathy.  RESPIRATORY:  Good air entry bilaterally.  No wheezes or rales.  CARDIOVASCULAR SYSTEM:  He has S1-S2, no murmur.  ABDOMEN:  Obese, soft and nontender with positive bowel sounds.  EXTREMITIES:  Showed no edema, cyanosis or clubbing.   LABS:  Hemoglobin of 13.5,  hematocrit of 37.6.  Right knee x-ray showed  revision right total knee arthroplasty with anatomic alignment and no  acute communicating  features.   ASSESSMENT:  This is a 55 year old gentleman admitted for right total  knee replacement.  Problem list for this patient at this point includes:  1. Postoperative right knee replacement.  2. Insulin-dependent diabetes.  3. Hypertension.  4. Coronary artery disease.  5. Gastroesophageal reflux disease.   RECOMMENDATIONS AT THIS POINT:  1. To check hemoglobin A1c.  2. Resume all his home medications except for aspirin.  3. We may reconsider Glucovance if his renal functions.  4. I agree with starting sliding scale insulin and continue.  5. I will increase the dose of his Lantus from 50-55 units at night;      and will follow closely and adjust.  6. Knowing that surgery is a highly metabolic state, it is likely to      raise his requirement for insulin.  7. Will continue his antihypertensives per home dose and make      adjustments as necessary.   I will follow the case with you accordingly until the patient is  discharged.      Barbette Merino, M.D.  Electronically Signed     LG/MEDQ  D:  11/15/2006  T:  11/15/2006  Job:  [id number]

## 2011-01-26 NOTE — Op Note (Signed)
NAME:  [name], KOZUB                           ACCOUNT NO.:  [id number]   MEDICAL RECORD NO.:  [id number]                   PATIENT TYPE:  INP   LOCATION:  5020                                 FACILITY:  [facility]   PHYSICIAN:  Rennis Harding, M.D.                     DATE OF BIRTH:  July 01, 1956   DATE OF PROCEDURE:  07/14/2003  DATE OF DISCHARGE:                                 OPERATIVE REPORT   PREOPERATIVE DIAGNOSIS:  1. Chronic back and bilateral lower extremity pain secondary to degenerative     disk disease and spinal stenosis L4-5 and L5-S1.  2. Status post L5-S1 laminotomy x2.   POSTOPERATIVE DIAGNOSIS:  1. Chronic back and bilateral lower extremity pain secondary to degenerative     disk disease and spinal stenosis L4-5 and L5-S1.  2. Status post L5-S1 laminotomy x2.   PROCEDURE:  1. Revision lumbar laminectomy L4-5 and L5-S1 with decompression of the     common dural sac and nerve roots bilaterally.  2. Transforaminal lumbar interbody fusion, L4-5 and L5-S1 with placement of     two Peek prosthetic cages.  3. Posterior spinal arthrodesis L4-S1.  4. Segmental pedicle screw instrumentation L4-S1 utilizing the Spinal     Concepts Encompass system.  5. Localized autogenous bone graft.  6. Monitoring of free running electromyographs for three hours and testing     of six pedicle screws with triggered electromyographs.   SURGEON:  Rennis Harding, M.D.   ASSISTANT:  _______________, P.A.   ANESTHESIA:  General endotracheal.   COMPLICATIONS:  None.   ESTIMATED BLOOD LOSS:  550 mL.   FLUIDS REPLACED:  200 mL CellSaver.   INDICATIONS:  The patient is a 55 year old male with a long history of  progressively worsening back and bilateral lower extremity pain, right  greater than left.  He is status post lumbar laminotomy x2.  It has been  refractory to all conservative treatment options.  His plain radiograph  showed degenerative changes at L4-5 and L5-S1.  MRI scan shows  degenerative  changes again, most pronounced at the L4-L5 and L5-S1 level.  The disk is  desiccated at bot levels.  There are broad based central disk protrusions at  L4-5 with facet hypertrophy and ligamentum flavum beginning.  There are  posterior osteophytes resolving in moderate central stenosis and narrowing  of the lateral recess.  At L5-S1 postoperative changes are noted on the  right.  There is a posterior lateral disk protrusion which is covered by  osteophytes.  This impinges upon the S1 nerve roots within the spinal canal.  In addition, there is biforaminal stenosis, right greater than left.   The patient was counseled regarding surgical options.  He elected to undergo  revision decompression with fusion L4-S1 utilizing pedicle screws and T-lift  procedure.  The risks, benefits and alternatives were extensively  discussed.  The patient  elected to proceed.   DESCRIPTION OF PROCEDURE:  The patient was properly identified in the  holding area and taken to the operating room.  He underwent general  endotracheal anesthesia without difficulty.  He was given prophylactic IV  antibiotics.  EMG leads were placed to monitor triggered and free running  EMGs using the Neurovision neuromonitoring system.  The patient was  carefully turned prone onto the operating room onto the AcroMed four poster  positioning frame.  All bony prominences were padded.  The face and eyes  were protected at all times.  The back was prepped and draped in the usual  sterile fashion.  We utilized the previous 10 cm incision which was centered  over the L5-S1 level and extended this several centimeters proximally.  Dissection was carried sharply through the deep fascia.  The paraspinal  muscles were subperiosteally elevated out over the L4-5 transverse processes  and sacral ala.  Great care was taken to protect the L3-4 facet joint  capsule.  We placed a deep retractor.  We defined the bony landmarks.  We   identified the previous laminotomy on the right side.  Using loop  magnification and head lights we carefully worked through the epidural  fibrosis freeing the edges of the laminotomy from the underlying dura.  The  dura was extremely scarred.  There was a large disk osteophyte complex  extending off the posterior margin of the L5 and S1 vertebral bodies and  disk space that involved the S1 nerve root on the right side.  We performed  a wide central laminectomy removing the entire lamina of L5.  The L4 segment  was decompressed proximal to the L4-5 facet joint.  Care was taken to  preserve the L3-4 interspinous ligament.  We completely decompressed the  nerve roots bilaterally.  The L5 and S1 nerve roots were identified from  their takeoff and traced out their respected foramen bilaterally.  We then  turned our attention to performing a transforaminal lumbar interbody fusion  on the right at L4-5 and L5-S1.  At the L4-5 level, an osteotomy was  performed of the inferior facet of L4.  The superior facet of L5 was  osteotomized, flushed with the pedicle.  This created a transforaminal  window.  We identified the exiting L4 nerve root.  The transversing L5 root  was gently retracted medially.  Sharp annulotomy was performed.  We then  performed a radical diskectomy using specialized instruments from the T-lift  set.  The cartilaginous endplates were scraped across to the contralateral  side.  The disk space was distracted out to 14 mm.  We irrigated the disk  space.  We collected local autogenous bone from the laminectomy, cleaned it  and morselized the bone for autograft.  We tightly packed the L4-5 disk  space with the localized autogenous bone graft.  We then placed a 14 x 30  Peek prosthetic cage which had been packed with bone graft into the  interspace.  This was carefully clamped across anterior in the midline.  We confirmed good positioning with lateral and AP fluoroscopy.  We then   performed a similar procedure at L5-S1.  We had to dissect the L5 and S1  nerve roots from the epidural fibrosis.  The disk space was severely  collapsed posteriorly with osteophytes.  An osteotome was used to enter the  disk space.  We distracted the interspace to 10 mm.  There was very little  disk material within the  L5-S2 interspace.  We again packed the disk space  with local autogenous bone graft and then placed a 10 x 25 mm Peek  prosthetic cage.  The disk was placed in an oblique angle.  We again  confirmed acceptable positioning with the fluoroscopy.   At this point, we turned our attention to performing a posterior lateral  arthrodesis at L4 through S1.  High speed bur was utilized to decorticate  the transverse process of L4-5 and the sacral ala bilaterally.  We then  packed the remaining local autogenous bone graft tightly into the lateral  gutters.   We then turned our attention to placing pedicle screw instrumentation L4-S1.  Each pedicle starting point was identified using anatomic landmarks.  The  pedicle was started with an awl followed by a pedicle finder.  Each pedicle  hole was carefully palpated from within the canal as well as within the  pedicle utilizing a ball peeler.  There were no breaches.  We placed 6.5 x  55 mm screws at L4, 6.5 x 50 mm screws at L5 and 7.5 x 40 mm screws in the  sacrum.  After placing each screw, it was tested using triggered EMGs.  We  had a response to greater than 20 mA consistent with intraosseous placement.  Fluoroscopy confirmed good positioning of the screws in both the AP and  lateral plane.  We then placed our titanium rods with gentle compression  across the L4-5 and L5-S1 segments.  Final tightening was carried out.  A  cross connector was placed.  We examined the area of the dura where the  lysis of adhesions was carried out.  There were several small blebs noted.  We performed a Valsalva maneuver, and there was no leakage of  CSF.  As a  protective measure, Tisseel fibrin glue was placed over the area of  neurolysis.  We placed Gelfoam over the exposed epidural space.  We checked  the nerves once again and found them to be well decompressed.  Deep Hemovac  drain placed.  Deep fascia closed with a #1 Vicryl running suture.  Subcutaneous layer closed with 0 Vicryl and 2-0 Vicryl and 3-0 nylon used on  the skin in a running fashion.  Sterile dressing applied.  The patient was  turned supine and extubated without difficulty.  It should be noted there  were no deleterious changes in free running EMGs throughout the procedure.  The patient was transferred to the recovery room in stable condition able to  move his upper and lower extremities.                                               Rennis Harding, M.D.    MC/MEDQ  D:  07/14/2003  T:  07/15/2003  Job:  [id number]

## 2011-01-26 NOTE — Cardiovascular Report (Signed)
[place]  Patient:    [name]                      MRN: [id number] Proc. Date: 10/12/99 Adm. Date:  [id number] Disc. Date: [id number] Attending:  Epifanio Lesches CC:         Alysia Penna. Doug Sou, M.D.             Richard A. Rollene Fare, M.D.             Maia Plan. Lindon Romp, M.D.             [facility].                        Cardiac Catheterization  PROCEDURE:  Retrograde central aortic catheterization; selective coronary angiography by Judkins technique; left ventricular angiogram, right anterior oblique and left anterior oblique projections; intracoronary nitroglycerin administration; abdominal aortic aneurysm aortic angiogram, midstream posteroanterior projection; right femoral artery closure with Perclose single-stitch device.  DESCRIPTION OF PROCEDURE:  The patient was brought to the second floor CP lab in a post-absorptive state after 5 mg of Valium p.o. premedication.  The right groin was prepped, draped in the usual manner, 1% Xylocaine was used for local anesthesia and the RFA was entered with a single anterior puncture using an 18 thin-walled needle. A 6-French short Terumo side-arm sheath was inserted without difficulty and catheterization was performed with 6-French Cordis 4-cm tapered coronary and pigtail catheters.  Procedure was done with Hexabrix dye.  LV angiogram was done in the RAO and LAO projection at 25 cc, 14 cc/sec, and 20 cc, 12 cc/sec, with pullback pressure in the CA showing no gradient across the aortic valve.  Several right coronary injections were done, pre and post IC nitroglycerin, including flush injections, because of mild catheter spasm at the tip of the right coronary catheter, which was demonstrated and resolved.  Abdominal aortic angiogram was one in the midstream PA projection at 30 cc, 20 cc/sec, above the level of the renal arteries, revealing widely patent normal left renal artery and  widely patent double right renal arteries.  The infrarenal abdominal aorta was widely patent and smooth and the proximal iliacs were normal with good runoff and no obstruction or aneurysm.  Catheter was removed, side-arm sheath was flushed and the right femoral artery as closed with a single-stitch Perclose device in the laboratory.  ACT was 128. Heparin was on hold.  When the patient was came to the laboratory, the patient as given 2 mg of Nubain for sedation in the lab and 5 mg premedication p.o. Valium.  Pressures:  LV:  115/0 (post IC nitroglycerin).  CA; LVEDP 12 mmHg.  PCA: 115/65 mmHg.  There was no gradient across the aortic valve on catheter pullback.  Fluoroscopy did not reveal any coronary, intracardiac or valvular calcification.  LV angiogram in the RAO and LAO projections showed a vigorously contracting LV ith angiographic LVH, no mitral regurgitation and no segmental wall motion abnormality, with EF approximately 60%.  The main left coronary was normal.  The left anterior descending was tortuous, large and a normal vessel that coursed to the apex of the heart where it bifurcated.  It gave off a large septal perforator from the proximal third, followed by a moderate-size first diagonal hat was normal and a small second diagonal from the mid-LAD that was normal.  There was a large optional diagonal branch (trifurcation vessel)  that bifurcated and was normal.  The first marginal was normal and moderate-size.  Second marginal was small. Third marginal was moderate-size and bifurcated normally and a distal circumflex was codominant with a distal PDA and PLA that were normal.  The right coronary artery had some mild catheter spasm at the tip but good flow and no significant stenosis.  The remainder of the vessel was widely patent and smooth; it was a codominant vessel of moderate size with a small PDA and PLA.  DISCUSSION:  The etiology of this patients  chest pain is unclear.  He has had a  history of chronic chest pain syndrome and risk factors for coronary disease that include heavy cigarette abuse, chronically, AODM, systemic hypertension and family history of coronary disease.  He is known to me from nondiagnostic treadmill exercise test, August 1996, followed by diagnostic catheterization, August 1996, showing normal coronary arteries.  He was empirically treated with GI medications at that time and has been on these chronically.  His father had bypass at age 63.  The patient is divorced three times, remarried now, father of two children from his first marriage and one from his second marriage.  He is employed as a Administrator, Optometrist.  He recently had pilonidal cyst resection by Dr. Maia Plan. Arkin several days prior to admission.  He was admitted with chest pain, myocardial infarction was ruled out by serial enzymes and EKGs, although he did have nonspecific ST-T changes on his EKG which have been intermittent and chronic.  Patient has also been on chronic Pepcid 20 mg, Lipitor 20 mg for hyperlipidemia, Hyzaar for hypertension and Zaroxolyn.  He has been on baby aspirin and recent Tylox for back pain and oral second-generation ______ .  The etiology of his chest pain is unclear but he has normal coronary arteries so I do not think it is of cardiac etiology.  He does have chronic GERD and remains o be a heavy smoker.  He is still a young man at 42 and has significant risk factors for development of disease in the future.  I would recommend discontinuation of  smoking.  He may need further evaluation of his diabetes and modification of his therapy and Zaroxolyn may be a little too strong of a diuretic for him at present, since he has had some recent lightheadedness suggesting orthostasis.  He is to return to the medical care of Dr. Jeneen Rinks C. Brewer.  Patient is reassured and will institute empiric GI  therapy with proton pump inhibitors.  If he has  continued chest pain, I would recommend upper GI workup with endoscopy.  CATHETERIZATION DIAGNOSES:  1. Chest pain, etiology not determined.  2. Normal coronary arteries.  3. Normal left ventricle.  4. Systemic hypertension.  5. Cigarette abuse, chronic.  6. Hyperlipidemia.  7. Family history of coronary disease.  8. Adult-onset diabetes mellitus.  9. Recent pilonidal cyst resection, Dr. Lindon Romp. 10. Chronic chest pain syndrome. 11. Gastroesophageal reflux disease. DD:  10/12/99 TD:  10/13/99 Job: [id number]

## 2011-01-26 NOTE — Op Note (Signed)
[name], [name]                 ACCOUNT NO.:  [id number]   MEDICAL RECORD NO.:  [id number]          PATIENT TYPE:  INP   LOCATION:  [id number]                         FACILITY:  [facility]   PHYSICIAN:  Rennis Harding, M.D.        DATE OF BIRTH:  Oct 05, 1955   DATE OF PROCEDURE:  02/06/2006  DATE OF DISCHARGE:                                 OPERATIVE REPORT   DIAGNOSES:  1.  Cervical spondylotic radiculopathy, C4-5, C5-6 and C6-7.  2.  History of previous anterior cervical fusion, C3-4.   PROCEDURE:  1.  Anterior cervical diskectomy - C4-5, C5-6, C6-7 - with decompression of      the thecal sac and nerve roots bilaterally.  2.  Anterior cervical arthrodesis - C4-5, C5-6 and C6-7 - with placement of      three allograft prosthesis spacers packed with local autogenous bone      graft.  3.  Anterior cervical plating, C4-C7, using the Abbott spine system.  4.  Exploration of C3-4 fusion anteriorly.   SURGEON:  Rennis Harding, M.D.   ASSISTANT:  Karenann Cai, P.A.   ANESTHESIA:  General endotracheal.   ESTIMATED BLOOD LOSS:  50 mL.   COMPLICATIONS:  None.   INDICATIONS:  The patient is a 55 year old male with progressively worsening  neck and right greater than left upper extremity pain.  He has failed to  respond to appropriate conservative measures.  His imaging studies show  degenerative changes below the previous fusion at C3-4.  MRI scan shows  foraminal narrowing right at C4-5 and C5-6 and on the left at C6-7.  There  was mild central stenosis, worse at C5-6.  Because of persistent symptoms,  he has elected to undergo extension of his fusion down to C7.  Risk and  benefits were reviewed.   PROCEDURE:  The patient was identified in the holding area and taken to the  operating room.  He underwent general endotracheal anesthesia without  difficulty and prophylactic IV antibiotics.  Carefully positioned on the  operating room table with Mayfield head rest.  His neck was placed into  slight  extension.  Five-pounds of halter traction applied, and the neck was  prepped and draped in the usual sterile fashion.  A longitudinal incision  was made along the course of the sternocleidomastoid muscle.  Dissection was  carried sharply through the platysma and previous scar from his C3-4 fusion.  The platysma was incised in the interval between the SCM and strap muscles  medially was developed down to the prevertebral space.  The esophagus,  trachea and carotid sheath were identified and protected at all times.  Anterior cervical spine was then exposed, and the previous fusion was  identified at C3-4.  This fusion was explored using Bovie electrocautery and  appeared to be solid.  We then placed a spinal needle at C4-5, took  intraoperative x-rays to confirm this level.  The longus coli muscle was  elevated out over the C4-5, C5-6 and C6-7 disk spaces.  Deep retractor was  placed.  Caspar distraction pins placed in the  C4-C5, C6-7 vertebral bodies.  Microscope was draped and brought into the field.  Starting at C6-7, a  radical diskectomy was carried back to the posterior longitudinal ligament.  The uncovertebral joints were overgrown and high-speed bur used to take down  the vertebral margins as well as complete the foraminotomies.  This bone was  then saved and used for later autografting.  A 2-mm Kerrison punch was used  to take down the posterior longitudinal ligament and complete wide  foraminotomies bilaterally.  We found foraminal stenosis, right greater than  left, with a small disk fragment within the foramen.  Once we were satisfied  with the decompression at this level, 8-mm allograft prosthesis spacer was  packed with the local bone graft obtained from the drill shavings.  This was  countersunk 2-mm into the interspace.  We then performed a similar procedure  at C5-6.  At this level, we found more degenerative changes.  The  uncovertebral joints were overgrown.  The foramen  were widely decompressed.  Posterior longitudinal ligament was taken down, and the vertebral margins  were undercut.  A 7-mm allograft prosthesis spacer again packed with local  bone graft and countersunk 2-mm into the interspace.  We then moved up to C4-  5 and again completed a radical diskectomy, took down the posterior  longitudinal ligament and completed the foraminotomies.  At this level, the  right foramen was much tighter than the left.  A 7-mm allograft prosthesis  spacer again inserted into the C4-5 level after packing with local bone  graft.  Countersunk 2 mm.  We then removed the Caspar distraction pin.  We  placed a 63-mm anterior cervical plate from [id number] using eight 13-mm screws.  We had good screw purchase, and we ensured that the locking mechanism  engaged.  Hemostasis was achieved using FloSeal and bipolar electrocautery.  The esophagus, trachea and carotid sheath were examined, and there were no  apparent injuries.  Intraoperative x-ray showed the top of the plate at C4-  5.  Unfortunately, we could not visualize the lower end of the  instrumentation due to the patient's body habitus.  A deep Penrose drain was  left in place.  The platysma was closed with interrupted 2-0 Vicryl suture,  subcutaneous layer closed with 3-0 Vicryl suture followed by a running 4-0  subcuticular Vicryl suture on the skin edges.  Benzoin and Steri-Strips  placed.  Sterile dressing applied.  Needle and sponge count correct.  The  Aspen cervical collar was placed on the patient's neck.  He was transferred  to recovery in stable condition, able to move his upper and lower  extremities.  It should be noted that my assistant helped throughout the  procedure, including positioning initial exposure, retraction under the  microscopic scope using the sucker and assisting with placement of the  instrumentation.  She also helped with wound closure.  It should also be noted that we discussed possibly  taking an anterior iliac crest bone graft  preoperatively.  Intraoperatively, the patient had significant bony  overgrowth which was drilled away, and we were able to collect significant  amounts of local autogenous bone graft, making the iliac crest bone graft  unnecessary.  We had injected the right anterior iliac bone graft site with  5 mL of 1% lidocaine with epinephrine at the start of the procedure, but  again this was not utilized.      Rennis Harding, M.D.  Electronically Signed     MC/MEDQ  D:  02/06/2006  T:  02/06/2006  Job:  [id number]

## 2011-01-26 NOTE — Op Note (Signed)
[name], [name]                 ACCOUNT NO.:  [id number]   MEDICAL RECORD NO.:  [id number]          PATIENT TYPE:  AMB   LOCATION:  ENDO                         FACILITY:  [facility]   PHYSICIAN:  Waverly Ferrari, M.D.    DATE OF BIRTH:  07-14-1956   DATE OF PROCEDURE:  DATE OF DISCHARGE:                                 OPERATIVE REPORT   PROCEDURE:  Colonoscopy.   INDICATIONS:  Colon polyps.   ANESTHESIA:  None further given.   PROCEDURE:  With the patient mildly sedated IN the left lateral decubitus  position, a rectal examination was performed which was unremarkable.  Prostate felt normal. Subsequently, the Olympus videoscopic colonoscope was  inserted in the rectum and passed under direct vision to the cecum  identified by ileocecal valve and appendiceal orifice, the latter of which  was photographed.  From this point the colonoscope was slowly withdrawn  taking circumferential views of colonic mucosa stopping only in the rectum  where some polyps were seen, photographed and removed using hot biopsy  forceps technique setting 20/200 blended current.  We then stopped slightly  distal to this where a larger raised polyp was seen and photographed and it  too removed with snare cautery technique again, same setting 20/200 blended  current. The endoscope was placed in retroflexion to view the anal canal  from above.  The endoscope was straightened and withdrawn. The patient's  vital signs and pulse oximeter remained stable. The patient tolerated the  procedure well without apparent complications.   FINDINGS:  Polyps of rectum removed.  Await biopsy report. The patient will  call me for results and follow-up with me as an outpatient.      GMO/MEDQ  D:  01/25/2005  T:  01/25/2005  Job:  [id number]

## 2011-01-26 NOTE — Op Note (Signed)
[name], [name]                 ACCOUNT NO.:  [id number]   MEDICAL RECORD NO.:  [id number]          PATIENT TYPE:  AMB   LOCATION:  [facility]                         FACILITY:  [facility]   PHYSICIAN:  Domingo Pulse, M.D.  DATE OF BIRTH:  July 27, 1956   DATE OF PROCEDURE:  08/14/2005  DATE OF DISCHARGE:                                 OPERATIVE REPORT   PREOPERATIVE DIAGNOSIS:  Organic erectile dysfunction.   POSTOPERATIVE DIAGNOSIS:  Organic erectile dysfunction.   PROCEDURE:  Infrapubic insertion of inflatable penile prosthesis.   SURGEON:  Dr. Amalia Hailey   ASSISTANT:  Dr. Earley Favor   ANESTHESIA:  General endotracheal.   IMPLANT:  Mentor Alpha Titan 22 cm with bilateral 2 cm rear tip extenders.   PROCEDURE:  The patient was identified by his wrist bracelet and brought to  room 4 where he received preprocedure antibiotics and prepped and draped in  the usual sterile fashion, taking care to minimize risk of peripheral  neuropathy and compartment syndrome.  Next, we placed an 18-French catheter  into his urinary bladder.  Clear urine output was obtained, and the Foley  catheter was capped.  Next, we made a 4 cm transverse infrapubic incision  and carried down through the dermis with Bovie cautery.  Then we bluntly  developed the deep space, taking care to avoid the midline and the  associated neurovascular bundle.  Next, the left and right corpora were  identified as well as the urethra on each side; 2-0 Vicryl stay sutures were  placed approximately 1 cm apart in a proximal location, taking care to avoid  the urethra.  Next, between each pair of stay sutures, a 1 cm corporotomy  was made sharply.  We began the corporal dissection by dissecting with the  Struhle scissors proximally and distally bilaterally.  We then used the  Mentor dilators to dilate from 8 to a 14-French.  This was done without  complication, taking care to avoid distal crossover on each side distally.  We then performed a  crossover check with a dilator down each corpora.  There  was no crossover noted.  Next, we measured the corpora.  It was found to  measure 21.5 cm with 6 cm proximally.  Next, we reset our retractors to  expose the fascia superiorly in the right lower quadrant, and a 1.5 cm  transverse incision was made, and we placed a 75 mL reservoir in the space  of Retzius which had then developed bluntly.  Next, using needle and a  Furlow device, we placed the bilateral corporal devices and then placed a 2  cm rear tip on each device and placed the devices proximally.  Next, using  our preplaced stay sutures, we tied each one of these down which closed the  corporotomy nicely, providing excellent closure over the cylinder and  excellent hemostasis.  We then developed a subdartos pouch in the right  hemiscrotum with the surgeon's first finger.  Following this, we inflated  the device.  It was found to be excellent contour and sit with no buckling  of the device  at the corporotomy site.  Following this, connection was made  between the reservoir and the pump tubing ensuring there was no foreign body  or air.  Using the connection kit, excellent watertight connection was  established.  Following this, we cycled the device and again inflated and  deflated nicely with excellent fit and contour.  We left a small amount of  normal saline in the corporal cylinders.  Next, we closed the incision  deeply with a running 2-0 Vicryl, taking great care to avoid the pump tubing  and the neurovascular bundle.  We then closed the skin with a  running 4-0 subcuticular Monocryl.  Dressings were applied.  Foley catheter  was removed.  The patient was taken to the PACU in stable condition.  Please  note that Dr. Alona Bene was present for and participated in all aspects  of this case.     ______________________________  Guillermina City, MD      Domingo Pulse, M.D.  Electronically Signed    MT/MEDQ  D:   08/14/2005  T:  08/14/2005  Job:  [id number]

## 2011-01-26 NOTE — H&P (Signed)
NAME:  [name], [name]                           ACCOUNT NO.:  [id number]   MEDICAL RECORD NO.:  [id number]                   PATIENT TYPE:  INP   LOCATION:  2550                                 FACILITY:  [facility]   PHYSICIAN:  Rennis Harding, M.D.                     DATE OF BIRTH:  1956-01-05   DATE OF ADMISSION:  07/14/2003  DATE OF DISCHARGE:                                HISTORY & PHYSICAL   CHIEF COMPLAINT:  Back and right lower extremity pain.   HISTORY OF PRESENT ILLNESS:  The patient is a 55 year old male who has been  having severe back and left lower extremity pain, as well as left lower  extremity weakness that has been progressing over several years.  It has  gotten to the point that it is severe in nature.  It is interfering with his  activities of daily living and quality of life.  Secondary to his MRI  findings of degenerative disk disease and spinal stenosis, as well as his  physical exam findings and his failure to improve with conservative  measures, the risks and benefits of the proposed surgery were discussed with  him by Dr. Patrice Paradise, as well as myself.  He indicated understanding and opted  to proceed.   ALLERGIES:  No known drug allergies.   MEDICATIONS:  1. Prevacid 30 mg daily.  2. Lipitor 80 mg daily.  3. Benicar HCT 40 mg daily.  4. Toprol XL 100 mg daily.  5. Aspirin daily, which he stopped for surgery.  6. Prozac 20 mg daily.  7. Actos 45 mg daily.  8. Meclozine 25 mg q.i.d. p.r.n.  9. Bextra 20 mg p.o. daily.   PAST MEDICAL HISTORY:  Significant for:  1. Hypertension.  2. GERD.  3. Hypercholesterolemia.  4. Diabetes.   PAST SURGICAL HISTORY:  1. Microdiskectomy in 1986.  2. Microdiskectomy and laminectomy in 1997.  3. Right shoulder surgery in 1998.  4. Cervical fusion in 1998.  5. Total knee in 2003.   SOCIAL HISTORY:  The patient smokes one packs of cigarettes per day.  Denies  alcohol use.  He is married and has three grown children.  His  wife will be  able to help him through his postoperative course.   FAMILY MEDICAL HISTORY:  Coronary artery disease, hypertension, diabetes,  cancer, and stroke.   REVIEW OF SYSTEMS:  The patient denies fever, chills, sweats, or bleeding  tendencies.  CENTRAL NERVOUS SYSTEM:  Denies blurred vision, double vision,  and seizures.  CARDIOVASCULAR:  Denies chest pain, angina, orthopnea,  claudication, and palpitations.  PULMONARY:  Denies shortness of breath,  productive cough, or hemoptysis.  GASTROINTESTINAL:  Denies nausea,  vomiting, constipation, diarrhea, melena, or bloody stool.  GENITOURINARY:  Denies dysuria, hematuria, or discharge.  MUSCULOSKELETAL:  As per HPI.   PHYSICAL EXAMINATION:  VITAL SIGNS:  The pulse is 84  and regular and  respirations 16 and unlabored.  GENERAL APPEARANCE:  The patient is a 55 year old white male who is alert  and oriented and in no acute distress.  He is well nourished, well groomed,  appears his stated age, and is pleasant and cooperative to exam.  HEENT:  The head is normocephalic and atraumatic.  Pupils equal, round, and  reactive.  Extraocular movements intact.  Nares patent.  The pharynx is  clear.  NECK:  Soft to palpation.  No lymphadenopathy and no thyromegaly noted.  CHEST:  Clear to auscultation bilaterally.  No rales, rhonchi, stridor,  wheezes, or friction rubs.  BREASTS:  Not pertinent and not performed.  HEART:  S1 and S2 with regular rate and rhythm.  No murmurs, rubs, or  gallops noted.  ABDOMEN:  Soft to palpation.  Nontender and nondistended.  No organomegaly  noted.  Positive bowel sounds.  GENITOURINARY:  Not pertinent and not performed.  EXTREMITIES:  The patient has right lower extremity pain.  Pulses are intact  and symmetric.  NEUROLOGIC:  Motor function and sensation are grossly intact.  SKIN:  Intact without any lesions or rashes.   LABORATORY DATA:  MRI shows L4-S1 spinal stenosis and degenerative disk  disease.    IMPRESSION:  1. Spinal stenosis and degenerative disk disease.  2. Hypertension.  3. Gastrointestinal reflux disease.  4. Hypercholesterolemia.  5. Diabetes.   PLAN:  Admit to [facility] on July 14, 2003, for an L4-S1  revision laminectomy and posterior spinal fusion to be done by Rennis Harding,  M.D.   The patient's primary care physician is Dr. Wilson Singer, who has cleared him  preoperatively for surgery.  Certainly if there are any medical issues, we  will contact Dr. Wilson Singer for assistance.      Karenann Cai, P.A.                       Rennis Harding, M.D.    CM/MEDQ  D:  07/14/2003  T:  07/14/2003  Job:  [id number]

## 2011-01-26 NOTE — Discharge Summary (Signed)
NAMEGREGORI, OKELLEY                 ACCOUNT NO.:  [id number]   MEDICAL RECORD NO.:  [id number]          PATIENT TYPE:  INP   LOCATION:  2021                         FACILITY:  [facility]   PHYSICIAN:  Jonna L. Gwynneth Aliment, M.D.DATE OF BIRTH:  1956/09/03   DATE OF ADMISSION:  10/27/2004  DATE OF DISCHARGE:  11/07/2004                           DISCHARGE SUMMARY - REFERRING   PRIMARY CARE PHYSICIAN:  Ronaldo Miyamoto, M.D.   CONSULTANTS:  1.  Waverly Ferrari, M.D., gastroenterology.  2.  Jeryl Columbia, M.D., gastroenterology.  3.  Richard A. Rollene Fare, M.D., cardiology.   FINAL DIAGNOSES:  1.  Chest pain with noncritical coronary artery disease.  2.  Left ventricular hypertrophy.  3.  Acute pancreatitis.  4.  Esophageal spasm.  5.  GERD.  6.  Hypertriglyceridemia.  7.  Poorly controlled type 2 diabetes.  8.  Hypokalemia.  9.  Morbid obesity.  10. Possible sleep apnea.  11. Cervical disc disease.   PROCEDURES:  1.  Cardiac catheterization October 31, 2004.  2.  ERCP November 01, 2004.   ALLERGIES:  None.   CODE STATUS:  Full.   HISTORY:  This 55 year old white male has multiple risk factors including  poorly controlled type 2 diabetes, hypertension, hyperlipidemia, smoking and  family history of coronary artery disease.  The patient has developed  retrosternal chest pain relieved with morphine and nitroglycerin.   HOSPITAL COURSE:  The patient ruled out for coronary artery disease. CAT  scan of the chest and abdomen were negative for dissection.  Patient had a  cardiac catheterization done on October 31, 2004, which did not show  significant disease.  Post catheterization he developed severe pain, nausea,  vomiting requiring narcotics and at that time Lipase and amylase went up  into the 1000 range and was felt he developed acute pancreatitis.  ERCP was  unremarkable. and the liver function tests and amylase and lipase  abnormalities gradually subsided over several days, however,  the patient  continued to have this subxiphoid pain and spasm.  It was eventually felt  the patient might have esophageal spasm.  It responded partially to Reglan,  not at all to Xanax and at the time of dictation, Norvasc for the spasm is  also being tried and a PIPIDA scan is ordered.  If the PIPIDA is negative,  then the patient can be sent home on Reglan and/or Norvasc depending on  response.   It also became obvious, the patient had very poorly controlled diabetes.  A1C was 8.2.  Sugars consistently ranged in the 200 to 300s.  The patient  was put on Lantus and this is being increased as we are getting ready to  send him home.  We will get follow-up by Dr. Wilson Singer in the office regarding  his eventual Lantus needs to keep his sugars down around 100.  The pain did  not respond to Xanax.  He also was started on Tricor for his  hypertriglyceridemia.  Also of note, he grew out some Klebsiella pneumoniae  out of his blood culture which was sensitive to ceftriaxone.  He  is  completing a course of that.  This could be switched to Avelox at discharge.  His triglycerides level was 869 with an HDL of 35, LDL was not calculated,  cholesterol was 209.   DISPOSITION:  The patient's medications should be as follows:  1.  Toprol XL 50 daily.  2.  Actos 45 daily.  3.  Lipitor 80 daily.  4.  Prozac 20 daily.  5.  Avapro 150 daily.  6.  Wellbutrin 150 daily.  7.  Protonix 40 b.i.d.  8.  Tricor 145 daily.  9.  Norvasc 5 mg b.i.d.  10. Reglan 5 a.c. and h.s.  11. He should try nitroglycerin 0.4 sublingual to see if that helps the      esophageal spasms if the Norvasc helps, he can be put on that, if not,      then discontinue it.  12. He is presently up to 56 units of Lantus at h.s.  depending on his blood      sugars.  The day of discharge this can be amended.   He is to see Dr. Wilson Singer in the office within a week.   CONDITION ON DISCHARGE:  Satisfactory.   45 minutes was taken.       JLB/MEDQ  D:  11/06/2004  T:  11/06/2004  Job:  [id number]   cc:   Ronaldo Miyamoto, M.D.  [address] [place] [address]  Fax: [phone]   Waverly Ferrari, M.D.  [address] [place]  [place] [address]  Fax: [phone]   Jeryl Columbia, M.D.  [address] [place]  [place] [address]  Fax: [phone]   Richard A. Rollene Fare, M.D.  [phone] [place] [address]  Fax: [phone]

## 2011-01-26 NOTE — Discharge Summary (Signed)
[name], [name]                        ACCOUNT NO.:  [id number]   MEDICAL RECORD NO.:  [id number]                   PATIENT TYPE:  INP   LOCATION:  0466                                 FACILITY:  [facility]   PHYSICIAN:  [name]. [name], M.D.             DATE OF BIRTH:  14-May-1956   DATE OF ADMISSION:  07/29/2002  DATE OF DISCHARGE:  08/03/2002                                 DISCHARGE SUMMARY   ADMISSION DIAGNOSES:  1. Right knee pain.  2. Osteoarthritis right knee.  3. Hypertension.  4. Gastroesophageal reflux disease.  5. Hiatal hernia.  6. Diabetes mellitus.   DISCHARGE DIAGNOSES:  1. Status post right total knee arthroplasty.  2. Osteoarthritis right knee.  3. Hypertension.  4. Gastroesophageal reflux disease.  5. Hiatal hernia.  6. Diabetes mellitus.   PROCEDURE:  The patient was taken to the operating room on July 29, 2002  to undergo a right total knee arthroplasty.  Dr. [name] was the  surgeon.  Assistants were Dr. Tarri Glenn and Mardene Sayer, P.A.-C.  The  surgery was done under general anesthesia as well as an epidural block.  Hemovac drain was placed at the time of surgery.   CONSULTS:  Occupational therapy, physical therapy.   BRIEF HISTORY:  The patient is a 55 year old male with a long history of  right knee pain.  He had previously injured his knee and undergone a right  knee arthroplasty by Dr. [name] who debrided the meniscal tear.  The  patient was also diagnosed with severe osteoarthritis of his right knee.  The patient has a strong family history of arthritis.  Due to increasing  pain, disability to perform his work without severe pain, it was felt that  this patient could benefit from right total knee arthroplasty.  The patient  elected to have the procedure done, was admitted to the hospital.   LABORATORY DATA:  Preoperative CBC:  WBC 9.2, hemoglobin 15.1, hematocrit  42.4, platelets 246,000.  Preoperative chemistry panel  normal except  elevated glucose at 235.  Preoperative urinalysis normal except for greater  than 1000 glucose.  Postoperative hemoglobin 12.5, hematocrit 28.9 dropping  to 8.4 before stabilizing at 9.5, hematocrit 27.3 at the time of discharge.  Preoperative x-ray of his right knee showed mild degenerative changes to the  medial compartment, moderate degenerative changes to the patellofemoral  compartment, joint effusion.  Postoperative x-ray revealed good position of  all components of a right total knee prosthesis.  Preoperative chest x-ray  revealed no acute findings.  Preoperative EKG revealed normal sinus rhythm  with a rate of 80.  Blood type AB-.   HOSPITAL COURSE:  The patient was admitted to [facility] on  August 28, 2002 and taken to the operating room.  He underwent a right  total knee arthroplasty without complications.  The patient tolerated the  procedure well and was allowed to return to the  recovery room and then to  the orthopedic floor to continue his postoperative care.  Hemovac drain was  placed at the time of surgery and removed on postoperative day one.  His  epidural was discontinued on postoperative day two.  PC analgesia was  discontinued on postoperative day one.  The patient's pain was well managed  with oral analgesics.  Hemoglobin and hematocrit were followed closely with  a drop in his hemoglobin to 8.4 before stabilizing to 9.3 prior to  discharge.  The patient progressed well and was discharged home on  postoperative day number five.   DISPOSITION:  Discharged home August 03, 2002.   DISCHARGE MEDICATIONS:  1. Percocet one p.o. q.4-6h. p.r.n. pain.  2. Robaxin 500 mg one p.o. q.4-6h. p.r.n. muscle spasm.  3. Coumadin per pharmacy.  4. Lipitor 80 mg one p.o. q.a.m.  5. Benicar/HCT 40/25 mg one p.o. q.a.m.  6. Prevacid 30 mg one daily.  7. Meclizine 20 mg one p.o. q.a.m.  8. Glucovance 2.5/500 mg two p.o. q.a.m., two p.o. q.h.s.  9.  Fluoxetine 20 mg one p.o. q.a.m.  10.      Toprol XL 100 mg one p.o. q.a.m.  11.      Actos 45 mg one p.o. q.a.m.   ACTIVITY:  As tolerated.  Full weightbearing with walker.  Observe total  knee precautions.   DIET:  As tolerated.   FOLLOW UP:  With Dr. [name] two weeks from the date of surgery in the  office.  Call to schedule appointment.   CONDITION ON DISCHARGE:  Improved.     Elby Showers. Paitsel, P.A.                     Ronald A. [name], M.D.    Delsa Bern  D:  08/21/2002  T:  08/21/2002  Job:  [id number]

## 2011-01-26 NOTE — Op Note (Signed)
NAMESHIRLEY, DUDOIT                 ACCOUNT NO.:  [id number]   MEDICAL RECORD NO.:  [id number]          PATIENT TYPE:  AMB   LOCATION:  ENDO                         FACILITY:  [facility]   PHYSICIAN:  Waverly Ferrari, M.D.    DATE OF BIRTH:  28-Sep-1955   DATE OF PROCEDURE:  01/25/2005  DATE OF DISCHARGE:                                 OPERATIVE REPORT   PROCEDURE:  Upper endoscopy with biopsy.   INDICATIONS:  GERD, abdominal pain.   ANESTHESIA:  Demerol 100 milligrams, versed 10 milligrams, Phenergan 25  milligrams.   PROCEDURE:  With the patient mildly sedated in the left lateral decubitus  position, the Olympus videoscopic endoscope was inserted in the mouth and  passed under direct vision through the esophagus which appeared normal. We  photographed this area and biopsied around the perimeter of the  squamocolumnar junction and entered into the stomach.  The fundus, body,  antrum, duodenal bulb, second portion duodenum visualized.  From this point,  the endoscope was slowly withdrawn taking circumferential views of duodenal  mucosa until the endoscope had been pulled back into the stomach and placed  in retroflexion to view the stomach from below.  The endoscope was  straightened and withdrawn, taking circumferential views remaining gastric  and esophageal mucosa stopping to biopsy erythematous changes in the  stomach. The patient's vital signs, pulse oximeter remained stable. The  patient tolerated procedure well without apparent complications.   FINDINGS:  Erythematous changes in stomach that were biopsied. Biopsies were  taken also at squamocolumnar junction to rule out possible occult Barrett's.   PLAN:  Await biopsy report. The patient will call me for results and follow-  up with me as an outpatient.  Proceed to colonoscopy.      GMO/MEDQ  D:  01/25/2005  T:  01/25/2005  Job:  [id number]

## 2011-01-26 NOTE — H&P (Signed)
[name], SCHOLTES                 ACCOUNT NO.:  [id number]   MEDICAL RECORD NO.:  [id number]          PATIENT TYPE:  INP   LOCATION:  NA                           FACILITY:  [facility]   PHYSICIAN:  [name]. [name], M.D.DATE OF BIRTH:  04-07-1956   DATE OF ADMISSION:  11/15/2006  DATE OF DISCHARGE:                              HISTORY & PHYSICAL   CHIEF COMPLAINT:  Painful swollen right total knee arthroplasty.   HISTORY OF PRESENT ILLNESS:  The patient is 55 year old gentleman here  today for preoperative evaluation for his upcoming revision of his right  total knee arthroplasty.  The patient had a right total knee  arthroplasty by Dr. [name] back in 2003.  He has been doing well but  over the last couple of months, he has noted some increased discomfort  and recurrent effusions in his right knee.  On evaluation, his knee was  aspirated.  There were no signs of infection.  X-rays revealed probable  wear of the poly causing some laxity, causing irritation of the synovial  lining causing recurrent effusions and pain.  The patient and Dr.  [name] discussed it and then elected to proceed for revision for a poly  exchange for a larger polyethylene bearing.   PRIMARY CARE PHYSICIAN:  Dr. Gareth Eagle   ALLERGIES:  NO KNOWN DRUG ALLERGIES.   CURRENT MEDICATIONS:  1. Lovaza 1 tab q.i.d.  2. Neurontin 300 mg daily.  3. Zetia 10 mg daily.  4. Prevacid 30 mg daily.  5. Actos 45 mg daily.  6. Toprol XL 50 mg daily.  7. Mobic 15 mg daily.  8. Lantus 50 units once daily at bedtime.  9. Aspirin 81 mg daily.  10.Prozac 20 mg daily.  11.Glucovance 2.5/500 mg daily in the a.m.  12.Tricor 145 mg daily.  13.Avapro 150 mg daily.  14.Reglan 5 mg q.i.d.  15.Norvasc 5 mg daily.  16.Requip 2 mg daily.  17.Xanax 0.25 mg daily.  18.Lipitor 80 mg daily.  19.Crestor 20 mg daily.  20.Tylox 5 mg q.4h. p.r.n.   CURRENT MEDICAL HISTORY:  1. Headaches related to his cervical degenerative disk  disease and      fusion.  2. Hypertension.  3. Coronary artery disease.  4. History of hiatal hernia.  5. History of reflux disease.  6. History of Barrett's esophagitis.  7. Diabetes, insulin-dependent.  8. 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  9. Hypercholesterolemia.  10.Restless leg syndrome.   PAST SURGICAL HISTORY:  1. Back surgery x3.  2. Cervical surgery x2.  3. Right knee replacement.  4. Right shoulder surgery.  5. Cholecystectomy.  6. Hernia repair.  7. Penile implants.  8. The patient denies any complications of the above-mentioned      surgical procedures.   FAMILY MEDICAL HISTORY:  Mother is deceased from complications of brain  tumor and pneumonia.  Father is deceased from complications diabetes,  colon cancer, and coronary artery disease.  The patient has a strong  history of cardiac disease and diabetes in the family along with colon  cancer and strokes.   SOCIAL HISTORY:  The patient is married.  He is on disability.  He  smokes 1 pack a day.  He reports he is going to stop at the date of this  surgery.  He has occasional alcoholic beverage.  He has 3 grown  children.  He is going be taken care by his wife in a one-story house.   PHYSICAL EXAMINATION:  VITAL SIGNS:  Height 6 feet 2 inches, weight is  297, blood pressure is 178/92, pulse of 74 and regular, respirations 12,  patient is afebrile.  GENERAL:  This is a tall, large bone-structured, slightly centrally  obese white male conscious, alert and appropriate walks with a right-  sided limp.  Appears to be in no extreme distress.  HEENT:  Head was normocephalic.  Pupils equal, round and reactive.  Extraocular movements intact.  Gross hearing is intact.  NECK:  He has well-healed surgical incisions.  He can flex his neck  forward to about 1 fingerbreadth to his chin to his chest.  He is only  able to bring it up to the horizontal position.  He can rotate about  40  degrees right and left, limited by mechanical and discomfort in his  neck.  CHEST:  Clear.  No signs of any wheezes, rales, rhonchi.  HEART:  Regular rate and rhythm murmurs, rubs or gallops.  ABDOMEN:  Soft, nontender, slightly centrally obese.  CVA region was  nontender.  UPPER EXTREMITIES:  Symmetrically size and shape.  Good range of motion  of shoulders, elbows and wrists.  Motor strength is 5/5.  LOWER EXTREMITIES:  Right and left hip had full extension, flexion up to  120 with 20 degrees internal-external rotation without any discomfort.  Right knee had full extension, flexion back to 115 degrees.  He did have  some laxity with valgus varus stressing.  He was positive for AP draw.  He had a moderate effusion today, no signs of infection.  The calf was  soft and nontender.  Right knee was painful on the medial joint.  He was  able to fully extend it and flex it back to about 115 degrees.  He had  some slight laxity with valgus varus stress.  Negative AP draw.  The  calf is soft, nontender.  The ankles were symmetrical with good dorsi  plantar flexion.  PERIPHERAL VASCULAR AND CAROTID PULSES:  2+, no bruits.  Radial pulses  were 2+.  Dorsalis pedis pulses were 1+.  He had trace lower extremity  edema.  NEURO:  The patient was conscious, alert and appropriate.  Easy  conversation with examiner.  No gross neurologic defects noted.  BREAST, RECTAL AND GU:  Deferred at this time.   IMPRESSION:  1. 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  2. Diabetes mellitus type 2.  3. Hypertension.  4. Hypercholesterolemia.  5. Reflux disease.  6. Barrett's esophagitis.  7. History of hiatal hernia.  8. History of headaches secondary to cervical fusion.  9. Cervical fusion and lumbar degenerative disk disease.  10.Blurred vision related to diabetes.  11.Hypercholesterolemia.  PLAN:  The patient has been evaluated by Dr. Wilson Singer and has been cleared  for this upcoming  surgical procedure.  The patient will undergo all  routine labs and tests prior to having a revision of his right total  knee arthroplasty with poly exchange for larger bearing.  The patient  has had multiple questions answered.      Herbie Baltimore  Ester Rink, P.A.    ______________________________  [name] [name], M.D.    RWK/MEDQ  D:  11/12/2006  T:  11/12/2006  Job:  [id number]

## 2011-01-26 NOTE — Discharge Summary (Signed)
NAME:  TRAETON, EGLER                           ACCOUNT NO.:  [id number]   MEDICAL RECORD NO.:  [id number]                   PATIENT TYPE:  INP   LOCATION:  5020                                 FACILITY:  [facility]   PHYSICIAN:  Rennis Harding, M.D.                     DATE OF BIRTH:  1955-09-27   DATE OF ADMISSION:  07/14/2003  DATE OF DISCHARGE:  07/17/2003                                 DISCHARGE SUMMARY   ADMITTING DIAGNOSES:  1. Spinal stenosis with degenerative disk disease.  2. Hypertension.  3. Gastrointestinal reflux disease.  4. Hypercholesterolemia.  5. Diabetes.   DISCHARGE DIAGNOSES:  1. ____________ L4-S1 revision laminectomy and posterior spinal fusion.  2. Mild postoperative hemorrhagic anemia _____________  3. Low-grade temperature postoperatively.   ADMITTING DIAGNOSIS:   CONSULTATIONS:  None.   PROCEDURE:  On July 14, 2003 L4-S1 revision laminectomy with posterior  spinal fusion __________ at L4-5 and L5-S1   SURGEON:  Rennis Harding, M.D.   ASSISTANT:  ___________ [facility]   ANESTHESIA:  General   ESTIMATED BLOOD LOSS:  550 cc _____________   LABS:  Preoperative CBC with differential was within normal limits.  Preoperative PT and INR were within normal limits.  ___________ was normal  with the exception of _______________ glucose of 492.  UA was negative with  the exception of _____________ less than 250.  Postoperatively H&H was  monitored and did reach above 9.8 and 27.9 respectively on November 6.  __________ asymptomatic and did not require blood transfusions.  BMET 1 day  postoperatively.  This has been _____________.      Karenann Cai, P.A.                       Rennis Harding, M.D.    CM/MEDQ  D:  09/22/2003  T:  09/22/2003  Job:  [id number]

## 2011-01-26 NOTE — Op Note (Signed)
[name], [name]                 ACCOUNT NO.:  [id number]   MEDICAL RECORD NO.:  [id number]          PATIENT TYPE:  AMB   LOCATION:  ENDO                         FACILITY:  [facility]   PHYSICIAN:  Waverly Ferrari, M.D.    DATE OF BIRTH:  1955/10/24   DATE OF PROCEDURE:  11/06/2006  DATE OF DISCHARGE:                               OPERATIVE REPORT   PROCEDURE:  Upper endoscopy with biopsy.   INDICATIONS:  Gastroesophageal reflux disease with Barrett's esophagus.   ANESTHESIA:  Demerol 100 mg, Versed 10 mg.   PROCEDURE:  With the patient mildly sedated in the left lateral  decubitus position, the Pentax videoscopic endoscope was inserted into  the mouth, passed under direct vision through the esophagus which  appeared normal, until we reached distal esophagus, and there were  changes of possible Barrett's that was not well seen.  The patient  gagged some during this exam, but we photographed the area and then  biopsied around squamocolumnar junction to look for Barrett's esophagus  once again.  We advanced the scope into the stomach.  Fundus, body,  antrum, duodenal bulb and second portion duodenum were visualized.  From  this point, the endoscope was slowly withdrawn taking circumferential  views of duodenal mucosa until the endoscope had been pulled back into  the stomach, placed in retroflexion to view the stomach from below.  The  endoscope was straightened and withdrawn, taking circumferential views  of the remaining gastric and esophageal mucosa.  The patient's vital  signs and pulse oximeter remained stable.  The patient tolerated the  procedure well and without apparent complications.   FINDINGS:  Question of Barrett's esophagus.  Once again, await biopsy  report.  The patient will call me for results and follow-up with me as  an outpatient.   Of note, the patient does state that he may have some problems  swallowing and will discuss that with him on his return visit as an  outpatient.           ______________________________  Waverly Ferrari, M.D.     GMO/MEDQ  D:  11/06/2006  T:  11/06/2006  Job:  [id number]

## 2011-01-26 NOTE — Op Note (Signed)
[name], [name]                        ACCOUNT NO.:  [id number]   MEDICAL RECORD NO.:  [id number]                   PATIENT TYPE:  INP   LOCATION:  0004                                 FACILITY[facility]  [facility]   PHYSICIAN:  [name]. [name], M.D.             DATE OF BIRTH:  [birth year]   DATE OF PROCEDURE:  07/29/2002  DATE OF DISCHARGE:                                 OPERATIVE REPORT   PREOPERATIVE DIAGNOSIS:  Severe degenerative arthritis, right knee.   POSTOPERATIVE DIAGNOSIS:  Severe degenerative arthritis, right knee.   OPERATION PERFORMED:  Right total knee arthroplasty utilizing the [employer]  system.  I utilized the posterior cruciate sacrificing system.  All three  components were cemented.  The sizes used were as follows:  I used a size 26  patella, size 11 right posterior cruciate sacrificing femoral component,  size 11 tibial tray with a 12 mm thickness flex insert and I utilized  vancomycin in the cement.   SURGEON:  [name]. [name], M.D.   ASSISTANT:  1. Tarri Glenn, M.D.  2. Elby Showers. Paitsel, P.A.   ANESTHESIA:  General.   DESCRIPTION OF PROCEDURE:  Under general anesthesia, he first had an  epidural block, routine orthopedic prepping and draping of the right lower  extremity was carried out.  After sterile prepping and draping of the right  lower extremity was carried out, the leg was exsanguinated with an Esmarch.  Tourniquet was elevated at 400 mmHg.  At this time an incision was made over  the anterior aspect of the right knee.  Bleeders identified and cauterized.  The incision was carried down to the quadriceps tendon.  Two flaps were  created and sutured in place.  I then carried out a median parapatellar  approach reflecting the patella laterally.  Flexed the knee and then excised  the lateral and medial menisci.  I also excised the anterior and posterior  cruciate ligaments, thoroughly debrided the knee.  All spurs were removed  from the femur,  tibia and patella.  Following this, I flexed the knee and  made my initial drill hole in the intercondylar notch of the femur.  I then  inserted the guide rod and the number 1 jig was inserted and 10 mm thickness  was removed from the distal femur.  After this, a #2 jig was inserted for a  size 11 right femur.  I carried out my anterior, posterior and chamfering  cuts.  After the femur was prepared, we then prepared the tibia in the usual  fashion.  I removed 6 mm thickness off the affected medial side of the  tibia.  The intramedullary guide rod was used during the procedure.  After  this, we then cut our patella and groove cut in the distal femur in the  usual fashion.  I then inserted my trials, went through trial reductions and  we finally decided to utilize a  12 mm thickness flex insert with a size 11  tray, size 11 femur and a 26 mm patella.  After this was done we then  prepared the patella for a size 26 mm and removed 10 mm thickness from the  articular surface of the patella and three drills were made.  Following  this, we then cut our keel cut in the proximal tibia in the usual fashion.  After all the cuts were made, we thoroughly water picked out the knee, dried  the knee out and cemented all three components in simultaneously.  After the  cement was hardened, I removed the trial insert and examined the posterior  aspect of the knee for any loose cement.  Loose pieces were removed.  We  thoroughly water picked the knee  out again to make sure we washed all the  loose fragments out.  After this, we then inserted our permanent 12 mm  thickness flex insert.  Note, vancomycin was utilized in the cement.  We  were asked to reduce the knee.  We took the knee through motion again.  We  had excellent stability.  We then carried out a lateral release.  We  deflated the tourniquet and checked for all bleeders.  We had good control  of the bleeding.  I then inserted the Hemovac drain and  closed the knee in  layered  usual fashion.  The skin was closed with metal staples and a  sterile Neosporin dressing was applied.  The patient left the operating room  in satisfactory condition.  He had one gram of IV Ancef preop and one at the  end of the procedure in the operating room.  The anesthesiologist also  started an epidural drip on him.                                               Ronald A. [name], M.D.    RAG/MEDQ  D:  07/29/2002  T:  07/29/2002  Job:  [id number]

## 2011-01-26 NOTE — H&P (Signed)
[name], [name]                        ACCOUNT NO.:  [id number]   MEDICAL RECORD NO.:  [id number]                   PATIENT TYPE:  INP   LOCATION:  NA                                   [facility]ACILITY:  [facility]   PHYSICIAN:  [name]. [name], M.D.             DATE OF BIRTH:  1956/02/04   DATE OF ADMISSION:  DATE OF DISCHARGE:                                HISTORY & PHYSICAL   CHIEF COMPLAINT:  Right knee pain.   HISTORY OF PRESENT ILLNESS:  The patient is a 55 year old male with a long  history of right knee pain.  The patient has had previous injury to his  right knee.  He has previously undergone right knee scope by Dr. [name],  and debridement of a meniscal tear from the injury, and according to the x-  rays he has severe osteoarthritis of his right knee which also runs deep in  his family.  He has now developed increasing pain and pain interfering with  work and his life.  Conservative treatments have failed and the risks and  benefits of the surgery have been discussed with the patient and the patient  wishes to proceed.   PAST MEDICAL HISTORY:  1. Hypertension.  2. Gastroesophageal reflux disease.  3. Hiatal hernia.  4. Diabetes mellitus.   PAST SURGICAL HISTORY:  1. Back surgery x2.  2. Right shoulder surgery.  3. Neck surgery.  4. Right hernia repair.  5. Right knee scope.   MEDICATIONS:  1. Vioxx 25 mg 1 p.o. q.d.  2. Prevacid 30 mg 1 p.o. q.d.  3. Toprol XL 1 p.o. q.d.  4. Aspirin 81 mg 1 p.o. q.d. which he has stopped.  5. Lipitor 80 mg 1 p.o. q.d.  6. Benicar HCT 40/25 mg 1 p.o. q.d.  7. Meclazine 25 mg 1 p.o. q.d.  8. Glucovance 2.5/500 mg 2 p.o. b.i.d.  9. Actos 45 mg 1 p.o. q.d.  10.      Fluoxetine 20 mg 1 p.o. q.d.   ALLERGIES:  No known drug allergies.   SOCIAL HISTORY:  The patient smokes one pack of cigarettes per day.  He  denies any alcohol.  He is married.  He lives in a one story house with no  steps.  His wife will be his caregiver  after surgery.   FAMILY HISTORY:  Father with coronary artery disease, diabetes mellitus, and  cancer.  Mother's history is not known.   REVIEW OF SYSTEMS:  GENERAL:  Positive for night sweats.  Denies fevers,  chills, bleeding tendencies.  CNS:  Positive for headache.  Denies blurry,  double vision, seizures, paralysis.  RESPIRATORY:  Positive shortness of  breath.  Denies productive cough, hemoptysis.  CARDIOVASCULAR:  Positive  chest pain.  Denies angina, orthopnea.  GI:  Positive diarrhea.  Denies  nausea, vomiting, constipation, melena or bloody stools.  GU:  Denies  dysuria, hematuria, discharge.  MUSCULOSKELETAL:  Pertinent as per HPI.   PHYSICAL EXAMINATION:  VITAL SIGNS:  Blood pressure 171/91, pulse 77,  respirations 16.  GENERAL:  Well-developed, well-nourished 55 year old male.  HEENT:  Normocephalic and atraumatic.  Pupils equal, round and reactive to  light.  NECK:  Supple.  No carotid bruits.  CHEST:  Clear to auscultation bilaterally.  No wheezes or crackles.  HEART:  Regular rate and rhythm, no murmur, rub or gallop.  ABDOMEN:  Soft, nontender, nondistended, positive bowel sounds x4.  EXTREMITIES:  Knee effusion on the right knee has painful range of motion  and tender to palpation of the medial lateral joint line.  SKIN:  No rashes or lesions.   LABORATORY DATA:  X-rays are pending.   IMPRESSION:  1. Osteoarthritis right knee.  2. Hypertension.  3. Gastroesophageal reflux disease.  4. Hiatal hernia.  5. Diabetes mellitus.   PLAN:  The patient will be admitted to [facility] on Wednesday  07/29/02, to undergo a right total knee arthroplasty by Dr. [name].  [name].     Pedro Earls, P.A.-C.                   Ronald A. [name], M.D.    SW/MEDQ  D:  07/27/2002  T:  07/27/2002  Job:  [id number]

## 2011-01-26 NOTE — Cardiovascular Report (Signed)
[name], [name]                 ACCOUNT NO.:  [id number]   MEDICAL RECORD NO.:  [id number]          PATIENT TYPE:  INP   LOCATION:  2021                         FACILITY:  [facility]   PHYSICIAN:  Richard A. Rollene Fare, M.D.DATE OF BIRTH:  01-27-1956   DATE OF PROCEDURE:  10/31/2004  DATE OF DISCHARGE:                              CARDIAC CATHETERIZATION   PROCEDURE:  Retrograde central aortic catheterization, selective coronary  angiography by Judkins technique, LV angiogram RAO, LAO projection,  intracoronary nitroglycerin administration, abdominal aortic angiogram, mid  stream PA projection, right common femoral artery closure with 6 French  AngioSeal device successful.   BRIEF HISTORY AND INDICATIONS:  This 55 year old white married father of  three with two grandchildren and two stepchildren.  He is a disabled truck  Holiday representative.  He has a history of hyperlipidemia, AODM, exogenous  obesity, continued cigarette abuse.  He also has a history of hypertension  and multiple prior back surgeries.  He has had a history of back surgery x2  and past anterior cervical fusion by Dr. Rodell Perna several years ago.  He  has a remote history of pilonidal cyst surgery by Dr. Elesa Hacker in 2001.  His last __________  was complex for on July 14, 2003, which required  revision lumbar laminectomy L4-L5, L5-S1 with decompression, lumbar fusion  with [facility] prosthetic cages and pedicle screws with autologous bone graft.  He is being considered for repeat cervical surgery as well by Dr. Patrice Paradise.   He has had several prior cardiac catheterizations.  He has chest pain  syndrome.  His last catheterization, these date back to 1996.  He has not  had prior significant coronary disease.  His last catheterization was done  as an outpatient at [facility] on July 28, 2002, prior to  knee replacement surgery.  At that time, he was found to have no significant  coronary disease with good LV  function, systemic hypertension.   He was readmitted October 27, 2004, with retrosternal chest pain.  Myocardial infarction was ruled out by serial enzymes and EKGs.  Because of  significant risk factors, he was scheduled for repeat catheterization.  BUN  and creatinine was 11/0.07 preoperatively.  He was hydrated preoperatively.  Glucophage was on hold and he was brought to the second floor CP lab.  The  right groin was prepped and draped in the usual manner, 1% Xylocaine was  used for local anesthesia.  CFRA was entered with single anterior puncture  using an 18 thin wall needle.  A 6 French short Daig side arm sheath was  inserted without difficulty.  Diagnostic coronary angiography was done pre  and post IC nitroglycerin administered, 200 mcg with 6 French 4 cm taper.  Cordis preformed coronary and pigtail catheters with Omnipaque dye used  throughout the procedure.  LV angiogram was done in the RAO and LAO  projection at 25 mL 14 mL per second, 20 mL 12 mL per second.  Pullback  pressure of CA showed no gradient across the aortic valve.  Abdominal  angiogram was done in the mid  stream PA projection at 25 mL 20 mL per  second.  There were dual accessory normal renal arteries on the right and  single normal renal artery on the left.  Infrarenal abdominal aorta had no  significant disease, was widely patent.  The previously placed lumbar  surgical hardware was seen fluoroscopically and the proximal iliacs were  normal.   A right femoral angiogram was done by hand and the oblique projection  through the side arm sheath demonstrating good puncture into the CFA.  A 6  French AngioSeal closure device was used to close the right femoral artery  which was successful.  The patient was transferred to the holding area for  postoperative care in stable condition.  He was given 2 mg of Versed for  sedation at the beginning of the procedure.   PRESSURES:  LV:  180/80; LVEDP 18 mmHg.   CA:   180/90 mmHg.   There was no gradient across the aortic valve on catheter pullback.   Post IC nitroglycerin administration.  Blood pressure came down to [id number] to  [id number] systolic.   LV angiogram in the RAO and LAO projection showed a normal and contracting  ventricle.  Angiographic LVH was present.  EF was greater than 55%.  There  was no mitral regurgitation, no segmental wall motion abnormalities.   Fluoroscopy did not reveal any significant coronary, intracardiac or  valvular calcification.   The main left coronary artery was normal, widely patent and smooth.   The LAD coursed to the apex of the heart where it bifurcated.  There was a  small bend at the junction of the proximal third.  There was no definite  myocardial bridging and no systolic compression.  There was approximately  20% mild irregularity and narrowing at this area.  The remainder of the LAD  was widely patent, smooth and normal.   There was a large first diagonal that trifurcated before SP1 from the  proximal LAD that was normal.   There was a small diagonal from the mid LAD that was normal.   There was a large optional diagonal vessel that was normal, bifurcated  distally, widely patent and smooth.   The circumflex gave off a large OM1 that was normal, bifurcated distally.  The remainder of the circumflex was comprised of an OM2, bifurcating the OM3  and a PAVG branch.  There was mild irregularity in this area but no  significant stenosis with good distal flow.   The right coronary was a dominant vessel of moderate size, gave off a large  trifurcating RV branch proximally that was normal, a mid RV branch that was  normal and PDA and PLA that were normal.   DISCUSSION:  The patient is outlined above.  The etiology of his chest pain  is not clear.  It may be upper GI related.  He has not had any specific  gallbladder symptoms but that may need to be investigated with his exogenous obesity and diabetes.  I would  recommend empiric GI therapy at present.  Because of his LVH, systemic hypertension and minimal coronary disease with  risk factors as outlined, I strongly recommend discontinuation of smoking.  Would recommend institution of beta and our calcium blockers in this  setting.   CATHETERIZATION DIAGNOSES:  1.  Arteriosclerotic heart disease.  Chest pain etiology not determined.  2.  Noncritical minor coronary disease as outlined above.  3.  Left ventricular hypertrophy, normal systolic function, systemic  hypertension, left ventricular hypertrophy with T-wave changes on EKG.  4.  Well preserved left ventricular function.  Normal renal arteries.  5.  Exogenous obesity.  6.  Probable sleep apnea.  7.  Gastroesophageal reflux disease.  8.  Hyperlipidemia, hypertriglyceridemia, severe.  9.  Ongoing cigarette abuse (quit one week ago).  10. Obesity.  11. Multiple prior surgeries:      1.  Right  rotator cuff repair in 1998/      2.  Cervical disc surgery 1999.      3.  Back surgery 1986, 1997 and November 2004.      4.  Inguinal hernia repair in 1997.      5.  Pilonidal cyst resection in 2001.  12. Adult onset diabetes mellitus.  13. Upcoming repeat cervical surgery.      RAW/MEDQ  D:  10/31/2004  T:  10/31/2004  Job:  [id number]   cc:   CP lab   Ronaldo Miyamoto, M.D.  [place]  [place] [address]  Fax: [phone]   Rennis Harding, M.D.  [place]  [place] [address]  Fax: [phone]

## 2011-01-26 NOTE — H&P (Signed)
NAMESERAPIO, GELINAS                 ACCOUNT NO.:  [id number]   MEDICAL RECORD NO.:  [id number]          PATIENT TYPE:  EMS   LOCATION:  MAJO                         FACILITY:  [facility]   PHYSICIAN:  Dooley L. Underwood, P.A.DATE OF BIRTH:  05-23-1956   DATE OF ADMISSION:  11/08/2004  DATE OF DISCHARGE:                                HISTORY & PHYSICAL   This history and physical was performed in our office on October 26, 2004.   CHIEF COMPLAINT:  Pain in my upper neck and left arm.   HISTORY OF PRESENT ILLNESS:  This 55 year old white male has been seen by [place]  for continuing progressive problems concerning pain into his cervical spine  with radiation into his left upper extremity.  He has numbness into his left  hand, can sometimes not identify objects in his hand such as different  coins.  He now is having problems to the right hand.  He is having  difficulty moving his head secondary to his neck pain.  He has terrific  headaches up into the back of his head per his report.   Examination of the cervical spine and upper extremities revealed weakness in  the left upper extremity, primarily the triceps and the extensors of the  wrist.  He has limited range of cervical movement in all planes.  MRI has  shown his previous fusion at C3-4 to be quite solid.  C4-5 with stenosis and  right foraminal narrowing.  Severe foraminal narrowing on the left was seen.  He had some mild stenosis at C5-6.  After much discussion, it was felt that  this patient would benefit from surgical intervention.  Analgesics as well  as anti-inflammatories really have not helped him.  He is quite apprehensive  about the progression of his neurological changes in the upper extremities  which one would expect and is highly desirous for relief.  It is therefore  decided to go ahead with an anterior cervical diskectomy and fusion at C4  through C7 with Allograft and plate.   PAST MEDICAL HISTORY:  His family physician  is Ronaldo Miyamoto, M.D. of  [facility].  The patient has a history of headaches  probably related to the present illness, these are posterior.  He has  hypertension, hiatal hernia, reflux disease, insulin-dependent diabetes  mellitus.   ALLERGIES:  No known drug allergies.   MEDICATIONS:  1.  Prevacid 30 mg one daily.  2.  Lipitor 80 mg one daily.  3.  Benicar/XCT 40 mg daily.  4.  Toprol XL 100 mg daily.  5.  81 mg Bayer aspirin daily (will stop prior to surgery).  6.  Prozac 20 mg daily.  7.  Glucovance 2.5/300 mg daily.  8.  Actos 45 mg daily.  9.  Meclozine 25 mg daily.  10. Moggit 15 mg daily.  11. Regular 1 mg daily.  12. Lantus Insulin.   PAST SURGICAL HISTORY:  He has had lumbar surgeries x3, the first one in  February of 1986, second in 1997, and the third is 2004.  His C3-4 neck  fusion was done in 1997.  He has had shoulder surgery as well.  He had a  right total knee replacement arthroplasty by [name]. [name], M.D. in  November of 2003.  He has had orthoscopic surgery to the right knee in  February of 2003.   FAMILY HISTORY:  Positive for heart disease in his father as well as  diabetes.  Father had prostate cancer and there is a strong history of  cancer in the family.  He states that his grandmother died from arthritis.  Hypertension is also in the family in many members per his description.   SOCIAL HISTORY:  The patient is married.  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  He has stopped smoking.  He has no intake of alcohol products.  He has three children.  His caregiver  after surgery will be his wife.  He lives in a one story home.   REVIEW OF SYSTEMS:  CNS:  No seizure disorder or paralysis, but neurological  changes as mentioned above in the upper extremities.  CARDIOVASCULAR:  No  chest pain, no angina, and no orthopnea.  GASTROINTESTINAL:  NO nausea,  vomiting, melena, or bloody stools.   GENITOURINARY:  No discharge, dysuria,  or hematuria.  MUSCULOSKELETAL:  Primarily in present illness.   PHYSICAL EXAMINATION:  GENERAL:  Alert, cooperative, friendly, husky 55-year-  old white male who moves very carefully during the examination.  He notes  that he has a tendency to rub his hands over one another.  VITAL SIGNS:  Blood pressure 160/92, pulse 76, respirations 12.  HEENT:  Normocephalic, PERRLA, EOM intact, oropharynx is clear.  CHEST:  Clear to auscultation with no rhonchi and no rales.  HEART:  Regular rate and rhythm, no murmurs are heard.  ABDOMEN:  Soft and nontender.  Liver and spleen not felt.  GENITOURINARY:  RECTAL:  Not done, not pertinent to present illness.  EXTREMITIES:  As mentioned above.   ADMISSION DIAGNOSES:  1.  Cervical spondylotic radiculopathy.  2.  Hypertension.  3.  Insulin-dependent diabetes mellitus.  4.  Gastroesophageal reflux disease.   PLAN:  The patient will undergo anterior cervical diskectomy and fusion C4  through C7 with Allograft and plate.  Should we have any medical problems,  we will certainly call Dr. Wilson Singer or one of his associates to follow along  with [place] during this patient's hospitalization.     DLU/MEDQ  D:  10/27/2004  T:  10/27/2004  Job:  [id number]   cc:   Ronaldo Miyamoto, M.D.  [address] [place]  [place] [address]  Fax: [phone]

## 2011-01-26 NOTE — H&P (Signed)
[name], [name]                 ACCOUNT NO.:  [id number]   MEDICAL RECORD NO.:  [id number]          PATIENT TYPE:  INP   LOCATION:  [id number]                         FACILITY:  [facility]   PHYSICIAN:  Rennis Harding, M.D.        DATE OF BIRTH:  10-01-1955   DATE OF ADMISSION:  02/06/2006  DATE OF DISCHARGE:  02/07/2006                                HISTORY & PHYSICAL   CHIEF COMPLAINT:  Neck and bilateral upper extremity pain.   HISTORY:  Patient is a 55 year old male who has had longstanding problems  with his neck and upper extremities.  He was found to have spinal stenosis  and neurogenic disks at C4-7.  He has had previous intracervical fusion from  C3-4.  He has failed conservative treatment.  It was felt his best course of  management would be a revision, ACDF at C4-7.  Risks and benefits of the  procedure were discussed with the patient by Dr. Patrice Paradise as well as myself.  He indicated understanding and opted to proceed.   ALLERGIES:  None.   MEDICATIONS:  Prevacid, Toprol, Mobic, Lantus insulin, aspirin, Prozac,  Glucovance, Actos, meclizine, Tricor, Avapro, Reglan, Norvasc, Requip,  Xanax, and Crestor.   PAST MEDICAL HISTORY:  1.  Diabetes.  2.  Hypertension.  3.  Gastroesophageal reflux disease.  4.  History of migraines.  5.  Osteoarthritis.   PAST SURGICAL HISTORY:  1.  Right shoulder rotator cuff repair.  2.  ACDF, C3-4.  3.  Lumbar surgery in 1986, 1996, and 2004.  4.  Right total knee in 2003.  5.  Cholecystectomy in 2006.   SOCIAL HISTORY:  Patient is married.  Does smoke cigarettes.  Denies alcohol  use.  He has three grown children.   FAMILY HISTORY:  Noncontributory.   REVIEW OF SYSTEMS:  Patient denies fevers, chills, sweats or bleeding  tendencies.  CNS:  Denies blurred vision, double vision, seizures,  headaches, paralysis.  CARDIOVASCULAR  Denies chest pain, angina, orthopnea,  claudication pelvic rock out of town.  PULMONARY:  Denies shortness of  breath,  productive cough, or hemoptysis.  GI:  Denies nausea, vomiting,  constipation, diarrhea, melena, or bloody stools.  GU:  Denies dysuria,  hematuria, or discharge.  MUSCULOSKELETAL:  As per HPI.   PHYSICAL EXAMINATION:  VITAL SIGNS:  Pulse 88 and regular, respirations 20  and unlabored, blood pressure 140/80.  GENERAL APPEARANCE:  Patient is a 55 year old white male who is alert and  oriented in no acute distress.  He is well-nourished, well-groomed, appears  his age.  Pleasant and cooperative to the exam.  HEENT:  Head is normocephalic and atraumatic.  Pupils are equal, round and  reactive to light.  Extraocular movements are intact.  Nares are patent.  Pharynx is clear.  NECK:  Supple to palpation.  No lymphadenopathy, thyromegaly, or bruits  appreciated.  CHEST:  Clear to auscultation bilaterally.  No rales, rhonchi, stridor,  wheezes, or friction rubs.  BREASTS:  Not pertinent, not performed.  HEART:  S1 and S2.  Regular rate and rhythm.  No murmurs,  rubs or gallops  noted.  ABDOMEN:  Soft to palpation.  Nontender, nondistended.  No organomegaly  noted.  Positive bowel sounds throughout.  GU:  Not pertinent or performed.  EXTREMITIES:  As per HPI.  SKIN:  Intact without any lesions or rashes.   X-ray and MRI show C4-7 herniated disk and foraminal narrowing, previous C3-  4 fusion.   IMPRESSION:  1.  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  2.  Diabetes.  3.  Hypertension.  4.  Gastroesophageal reflux disease.   PLAN:  1.  Admit to [facility] on Feb 06, 2006 for an ACDF, C4-7.  Dr.      Patrice Paradise will be doing the surgery.  2.  Patient's primary care doctor is Dr. Wilson Singer.      Karenann Cai, P.A.      Rennis Harding, M.D.  Electronically Signed    CM/MEDQ  D:  02/07/2006  T:  02/07/2006  Job:  [id number]

## 2011-01-26 NOTE — Discharge Summary (Signed)
[name], BRINSER                 ACCOUNT NO.:  [id number]   MEDICAL RECORD NO.:  [id number]          PATIENT TYPE:  INP   LOCATION:  35                         FACILITY:  [facility]   PHYSICIAN:  [name]. [name], M.D.DATE OF BIRTH:  11/01/1955   DATE OF ADMISSION:  11/15/2006  DATE OF DISCHARGE:  11/18/2006                               DISCHARGE SUMMARY   DISPOSITION:  Discharged November 18, 2006, to home.   ADMISSION DIAGNOSIS:  1. Painful swollen right total knee arthroplasty with worn      polyethylene bearing.  2. Diabetes.  3. Hypertension.  4. Hypercholesterolemia.  5. Reflux disease.  6. History of Barrett's esophagitis.  7. History of hiatal hernia.  8. History of prior headache secondary to cervical fusion.  9. Cervical and lumbar fusion.  10.Blurred vision due to diabetes.   DISCHARGE DIAGNOSES:  1. Revision right total knee arthroplasty with poly exchange for      larger polyethylene bearing.  2. Diabetes mellitus, type 2.  3. Hypertension.  4. Hypercholesterolemia.  5. Reflux disease.  6. History of Barrett's esophagitis.  7. History of hiatal hernia.  8. History of headaches secondary to cervical fusion.  9. The history of cervical and lumbar fusion.  10.Blurred vision due to diabetes.   HISTORY OF PRESENT ILLNESS:  The patient is a 55 year old gentleman  whose preoperative evaluation revealed that he had a right total knee  arthroplasty with wearing of his polyethylene bearing.  The patient was  having pain due to the laxity in the knee with multiple effusions. Dr.  [name] and the patient elected to proceed with a remission for poly  exchange with larger bearing.   ALLERGIES:  No known drug allergies.   CURRENT MEDICATIONS:  1. Lovaza 1 tablet 4 times a day.  2. Neurontin 300 mg a day.  3. Zetia 10 mg a day.  4. Prevacid 30 mg a day.  5. Actos 40 mg a day.  6. Toprol XL 50 mg a day.  7. Mobic 15 mg a day.  8. Lantus 50 units once a day at  bedtime.  9. Aspirin 81 mg a day.  10.Prozac 20 mg a day.  11.Glucovance 2.5/500 mg in the a.m.  12.TriCor 145 mg a day.  13.Avapro 150 mg a day.  14.Reglan 5 mg 4 times a day.  15.Norvasc 5 mg a day.  16.Requip 2 mg a day.  17.Xanax 0.25 mg a day.  18.Lipitor 80 mg a day.  19.Crestor 20 mg a day.  20.Tylox 5 mg every 4 hours p.r.n.   SURGICAL PROCEDURES:  On December 16, 2006, the patient was taken to the OR  by Dr. [name], assisted by [name], PA-C.  Under spinal anesthesia,  the patient underwent a revision of his right total knee arthroplasty  with synovectomy and removal of polyethylene bearing with reimplantation  larger bearing.  The patient tolerated the procedure well.  There was  minimal blood loss and no complications.  The patient was transferred to  recovery room for routine total knee protocol.  The patient had a 15 mm  size, 11 polyethylene bearing implanted.  We also placed two Mitek  anchors to reattach and support the patella tendon to be attached.   CONSULTS:  1. Routine followup consults requested:  Physical therapy, case      management.  2. A Medical consult was also requested due to the patient's multiple      medical issues.   HOSPITAL COURSE:  On November 15, 2006, the patient was admitted to [facility] in the care of Dr. [name]. The patient was taken to  the OR where a revision of his right total knee arthroplasty was  performed without any complications.  The patient tolerated procedure  well, was transferred to recovery room then to orthopedic floor on IV  pain medicines, antibiotics, and DVT prophylaxis.   The patient then incurred a total of 3 days postoperative course on the  hospital floor.  The patient initially had significant problems with his  pain control.  The patient was on IV antibiotics and pain medicines and  p.o. medications, but the patient did not discuss his pain management  issues with the nurse; instead decided to call  outside the hospital, and  arrangements were made once the medical supporting staff was notified to  adjust the patient's medications.  The patient's wound remained benign  without any signs of infection.  His leg remained neuromotor and  vascularly intact.  The patient's was evaluated on a daily basis by the  hospitalists for his multiple medical issues.  He was found to be a  poorly controlled diabetic, and his diabetic medications were adjusted.  The patient had no untoward complications during his hospitalization. He  worked well with physical therapy, and the patient was discharged to  home on postop day #3 in good condition with outpatient physical therapy  and home health management.   LABORATORY DATA:  Hemoglobin on admission was 15.0, hematocrit 43.0. On  March 10, hemoglobin was 11.8, hematocrit 32.7.  INR on date of  discharge was 1.3.  Hemoglobin A1c was 8.1. His urine on admission  showed greater than 1000 glucose, 30 protein.   EKG on admission was normal sinus rhythm at 60 beats per minute.   Chest x-ray showed peribronchial thickening.  No active disease.   DISCHARGE INSTRUCTIONS.:   ACTIVITY:  The patient is to increase activities slowly with the use of  walker and crutches. May shower.   DIET:  Low-modified carbohydrate diabetic diet.   WOUND CARE:  The patient is to change his dressing on a daily basis.   FOLLOW UP:  The patient needs a followup appointment with Dr. [name] in  2 weeks from discharge.   DISCHARGE MEDICATIONS:  1. Robaxin 500 mg three times daily as needed for spasms.  2. Coumadin 5 mg a day unless changed by pharmacist.  3. Percocet 10/650 one tablet every 4-6 hours for pain if needed.  4. Keflex 500 mg 1 tablet four times a day.  5. Lipitor 80 mg a day.  6. Lovaza capsule 900 mg 1 tablet four times a day.  7. Neurontin 300 mg a day.  8. Zetia 10 mg a day.  9. Prevacid 30 mg a day.  10.Actos 40 mg a day. 11.Toprol XL 50 mg a day.   12.Lantus 50 units at bedtime.  13.Prozac 20 mg a day.  14.Glucovance 225/500 mg once a day.  15.TriCor 145 mg a day.  16.Avapro 150 mg a day.  17.Reglan 5 mg four times a day.  18.Norvasc 5 mg a day.  19.Requip 2 mg a day.  20.Xanax 0.25 mg a day.   The patient's condition upon discharge to home is listed as improved and  good.      Evert Kohl, P.A.    ______________________________  [name] [name], M.D.    RWK/MEDQ  D:  12/15/2006  T:  12/15/2006  Job:  [id number]

## 2011-01-26 NOTE — Op Note (Signed)
[place]  Patient:    [name]                      MRN: [id number] Proc. Date: 10/09/99 Adm. Date:  [id number] Attending:  Westly Pam                           Operative Report  PREOPERATIVE DIAGNOSIS:  Pilonidal cyst fistula.  POSTOPERATIVE DIAGNOSIS:  Pilonidal cyst fistula.  OPERATION PERFORMED:  Pilonidal cystectomy.  SURGEON:  Maia Plan. Lindon Romp, M.D.  ASSISTANT:  None.  ANESTHESIA:  Endotracheal by hospital.  DESCRIPTION OF PROCEDURE:  Under good endotracheal anesthesia with the patient lying on his stomach, the skin of the buttocks was prepped and draped, and taped apart.  The patient had two fistulas and a sizeable pilonidal cyst.  The fistula was probed and then cut down on.  The entire pilonidal cyst was then excised and hemostasis obtained with electrocautery current.  The defect was made smaller by approximating the skin to the base of the defect with 3-0 chromic suture.  The wound was then packed with Betadine gauze.  Estimated blood loss for the procedure was minimal.  The patient received no blood and left the operating room in satisfactory condition after sponge and needle counts were verified. DD:  10/09/99 TD:  10/09/99 Job: [id number]

## 2011-01-26 NOTE — Op Note (Signed)
NAMEYUREN, EBERTS                 ACCOUNT NO.:  [id number]   MEDICAL RECORD NO.:  [id number]          PATIENT TYPE:  INP   LOCATION:  0002                         FACILITY:  [facility]   PHYSICIAN:  [name]. [name], M.D.DATE OF BIRTH:  1956-04-01   DATE OF PROCEDURE:  11/15/2006  DATE OF DISCHARGE:                               OPERATIVE REPORT   SURGEON:  Dr. [name]   OPERATIONS:  Lorretta Harp, PA-C.   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   OPERATION:  1. Extensive synovectomy right knee.  2. Polyethylene liner exchange right knee.  We removed an 11 mm      diameter x 12 mm thickness polyethylene liner and replaced it with      an 11 mm diameter 15 mm thickness polyethylene liner.   PROCEDURE:  Under spinal anesthesia, a routine orthopedic prep and drape  of the right knee is carried out.  He had 2 g of IV Ancef.  At this  time, the leg is exsanguinated with Esmarch; tourniquet was elevated at  375 mmHg.  An incision was made over the anterior aspect of the right  knee, bleeders identified and cauterized.  Two flaps were created.  I  then carried out a median parapatellar incision.  I had to do a proximal  snip procedure on the quadriceps tendon and then reflected the patella.  Then flexed the knee and did an extensive synovectomy.  He had severe  chronic synovitis in the knee.  Note, preop, his white count, sed rate,  and C-reactive protein were all normal.  He never showed any signs of  any infection.  He was afebrile.  Following that, I then utilized the  osteotome to remove the polyethylene liner.  I debrided the posterior  aspect of the knee joint, continued the synovectomy.  Thoroughly water-  picked out the knee.  I then elected to utilize a 15 mm thickness size  11 tibial tray insert.  The proximal tibia was  displaced forward.  I  inserted the insert, snapped it in place, reduced the knee, and now we  had a good stable knee and medial lateral stability in regards to  flexion/extension as well.  Two Mitek anchor sutures were utilized to  anchor the medial aspect of the patellar tendon down to the bone.  I  then inserted a Hemovac drain and closed the knee in layers in usual  fashion.  I inject about 30 mL of 0.25% Sensorcaine with epinephrine  into the knee surrounding soft tissues.  The soft tissue was closed with  0 Vicryl, skin with metal staples.  Sterile Neosporin  dressing was applied.  The patient left the operating room in  satisfactory condition.  He will be discharged on Coumadin.  He will be  maintained on Coumadin and heparin in the hospital as well as IV Ancef.  He will see me in the office in 2 weeks for suture removal.  ______________________________  [name] [name], M.D.     RAG/MEDQ  D:  11/15/2006  T:  11/15/2006  Job:  [id number]   cc:   Ronaldo Miyamoto, M.D.  Fax: [phone]

## 2011-01-26 NOTE — H&P (Signed)
[name], [name]                 ACCOUNT NO.:  [id number]   MEDICAL RECORD NO.:  [id number]          PATIENT TYPE:  INP   LOCATION:  2021                         FACILITY:  [place]   PHYSICIAN:  Barbette Merino, M.D.      DATE OF BIRTH:  10-10-1955   DATE OF ADMISSION:  10/27/2004  DATE OF DISCHARGE:                                HISTORY & PHYSICAL   PRIMARY CARE PHYSICIAN:  Ronaldo Miyamoto, M.D.   CARDIOLOGIST:  Richard A. Rollene Fare, M.D.   PRESENTING COMPLAINT:  Retrosternal chest pain.   HISTORY OF PRESENT ILLNESS:  This is a 55 year old white male with  significant family history for coronary artery disease, diabetes type 2,  hypertension, dyslipidemia, and tobacco abuse who is presenting with acute  onset of retrosternal chest pain.  Patient has apparently been seeing Dr.  Rollene Fare in this office since he did a cardiac catheterization for him in  2001 that was essentially normal.  He subsequently was being seen also by  Dr. Rennis Harding for cervical disk disease and is being scheduled for surgery  on March 1.  Patient's work-up today at 11:00 in the morning was acute onset  of retrosternal chest pain that he described as 10/10, no radiation, no  associated nausea, vomiting, or diaphoresis.  Pain was mainly relieved in  the emergency room with combination of morphine and nitroglycerin.  He  denied any cough.  Denied any fever.   PAST MEDICAL HISTORY:  1.  Hypertension.  2.  Insulin-dependent diabetes type 2.  3.  Dyslipidemia.  4.  History of tobacco abuse.  5.  Degenerative disk disease.  6.  Spinal stenosis in the lumbar region.  7.  History of stable emphysema.  8.  GERD.  9.  Previous chest pain as indicated.  10. Patient also has some baseline anxiety.   MEDICATIONS:  1.  Prevacid 30 mg daily.  2.  Lipitor 80 mg daily.  3.  Benicar/HCTZ 40/12.5 mg one daily.  4.  Toprol XL 100 mg daily.  5.  Aspirin 81 mg daily.  6.  Prozac 20 mg daily.  7.  Glucovance 2.5/500 mg  b.i.d.  8.  Actos 45 mg daily.  9.  Meclizine 25 mg up to q.i.d. p.r.n.  10. Mobic 50 mg daily.  11. Lantus 22 units q.h.s.   ALLERGIES:  Patient has no known drug allergies.   SOCIAL HISTORY:  Patient lives in [place] with his wife.  He is currently  unemployed.  Smokes about a pack per day, but said to quit one week ago.  Drinks alcohol mainly occasionally.   FAMILY HISTORY:  Significant for heart disease, hypertension, dyslipidemia,  and cancer on both sides of the family.   REVIEW OF SYSTEMS:  Essentially normal as in HPI.   PHYSICAL EXAMINATION:  VITAL SIGNS:  Temperature 98.1, blood pressure  200/99, pulse 68, respiratory rate 22, saturation 98% on room air.  GENERAL:  Patient looks acutely ill-looking, seems overweight to obese.  HEENT:  PERRL.  EOMI.  NECK:  Supple.  No JVD.  No lymphadenopathy.  RESPIRATORY:  Good air entry bilaterally.  No wheezes or rales.  CARDIOVASCULAR:  Regular rate and rhythm.  ABDOMEN:  Obese, nontender with positive bowel sounds.  EXTREMITIES:  No edema, cyanosis, or clubbing.   LABORATORIES:  White count 8.9, hemoglobin 15.1, platelets 232 with normal  differential.  Sodium 133, potassium 3.9, chloride 100, CO2 22, glucose 203,  BUN 10, creatinine 0.6, calcium 8.9.  Albumin 3.9, AST 24, ALT 18, alkaline  phosphatase 118, total bilirubin 1.1.  Initial myoglobin 41.6, troponin less  than 0.05.  Chest x-ray showed cardiomegaly, mild pulmonary vascular  congestion, and mild chronic interstitial lung disease.  EKG showed normal  sinus rhythm with normal axis, normal intervals.  No ST-T wave changes.  No  change from previous.   ASSESSMENT:  1.  This is a 55 year old white male with significant history for coronary      artery disease including hypertension, diabetes, dyslipidemia, family      history, although with previous normal catheterization five years ago      who presents today with increasing chest pain relieved by nitroglycerin       and morphine.  Chest pain could be due to a number of differentials.      Could be acute coronary syndrome.  It could also be secondary to      gastroesophageal reflux disease.  It could also be musculoskeletal,      especially with cervical disk disease.  Could be referred pain down to      the front of his chest but it is less likely.  With patient's high risk      for possible myocardial infarction and the high blood pressure seen on      admission, I would be more inclined towards admitting the patient to      telemetry and ruling out myocardial infarction.  I will keep him on      nitroglycerin drip for now both for his blood pressure control as well      as for pain control.  Will continue with the morphine, start him on      Lovenox, aspirin.  Will continue Lipitor and beta blocker.  I will check      cardiac enzymes serially, TSH, fasting lipid panel, UDS.  I will also      check hemoglobin A1c and 2-D echocardiogram.  2.  For his diabetes I will continue with his home dose Lantus.  In addition      I will add sliding scale insulin and the Actos.  I will hold the      Glucovance for now.  3.  For dyslipidemia I will check his fasting lipid panel and continue the      Lipitor as indicated.  4.  For his degenerative disk disease I will continue pain control with      Tylenol and morphine.  I will hold the Mobic for now, but will restart      it as needed.  I will also go ahead and call Dr. Lowella Fairy office and      get a consult as patient may require some cardiac catheterization due to      his high risk status.      LG/MEDQ  D:  10/27/2004  T:  10/27/2004  Job:  [id number]   cc:   Delfino Lovett A. Rollene Fare, M.D.  [phone] [place] [address]  Fax: [phone]

## 2011-01-26 NOTE — Op Note (Signed)
[name], [name]                 ACCOUNT NO.:  [id number]   MEDICAL RECORD NO.:  [id number]          PATIENT TYPE:  INP   LOCATION:  5024                         FACILITY:  [place]   PHYSICIAN:  Sammuel Hines. Daiva Nakayama, M.D. DATE OF BIRTH:  Jun 28, 1956   DATE OF PROCEDURE:  11/09/2004  DATE OF DISCHARGE:  11/10/2004                                 OPERATIVE REPORT   PREOPERATIVE DIAGNOSIS:  Obstructed cystic duct.   POSTOPERATIVE DIAGNOSIS:  Cholecystitis.   PROCEDURE:  Laparoscopic cholecystectomy with intraoperative cholangiogram.   SURGEON:  Sammuel Hines. Marlou Starks, M.D.   ASSISTANT:  Gwenyth Ober, M.D.   ANESTHESIA:  General endotracheal.   PROCEDURE:  After informed consent was obtained, the patient was brought to  the operating room and placed on the operating table.  After adequate  induction of general anesthesia, the patient's abdomen was prepped with  Betadine and draped in the usual sterile manner.  The area above the  umbilicus was infiltrated with 0.25% Marcaine.  A small incision was made  with a 15 blade knife.  This incision was carried down through the  subcutaneous tissue bluntly with a Kelly clamp and [employer] retractors  until the linea alba was identified.  The linea alba was incised with a 15  blade knife and each side was grasped with Kocher clamps and elevated  anteriorly.  The preperitoneal space was then probed bluntly with a hemostat  until the peritoneum was opened and access was gained to the abdominal  cavity.  A 0 Vicryl pursestring stitch was placed on the fascia surrounding  the opening.  A Hasson cannula was placed through the opening and anchored  in place with the previously-placed Vicryl pursestring stitch.  The abdomen  was then infiltrated with carbon dioxide without difficulty.  The patient  was placed in a head-up position and rotated slightly with the right side  up.  Next the epigastric region was infiltrated with 0.25% Marcaine and a  small incision  was made with a 15 blade knife.  The 10 mm port was placed  bluntly through the incision into the abdominal cavity under direct vision.  Sites were then chosen laterally on the right side of the abdomen for  placement of 5 mm ports.  Each of these areas was infiltrated with 0.25%  Marcaine.  Small stab incisions were made with a 15 blade knife.  Five  millimeter ports were then placed bluntly through these incisions into the  abdominal cavity under direct vision.  A blunt grasper was placed through  the lateralmost port and used to grasp the dome of the gallbladder and  elevate it anteriorly and superiorly.  The dissector was placed through the  epigastric port.  Some omental adhesions to the gallbladder were taken down  by blunt dissection.  Another blunt grasper was placed through the other 5  mm port and used to retract on the body and neck of the gallbladder.  The  dissector was used to open the peritoneal reflection at the gallbladder neck  area.  Blunt dissection was then carried out in  this area until the  gallbladder neck-cystic duct junction was readily identified.  A good window  was created.  A single clip was placed on the gallbladder neck.  A small  ductotomy was made just below the clip with the laparoscopic scissors.  A 14-  gauge Angiocath was then placed percutaneously through the anterior  abdominal wall under direct vision.  A Reddick cholangiogram catheter was  placed through the Angiocath and flushed.  The Reddick catheter was then  placed within the cystic duct and anchored in place with a clip.  A  cholangiogram was obtained, which showed no filling defects, good emptying  into the duodenum, and adequate length on the cystic duct.  The anchoring  clip and catheters were then removed from the patient and three clips placed  proximally on the cystic duct, and the duct was divided between the two sets  of clips.  Posterior to this the cystic artery was identified and  again  dissected bluntly in a circumferential manner until a good window was  created.  Two clips were placed proximally and one distally on the artery,  and the artery was divided between the two.  Next the laparoscopic hook  cautery device was used to separate the gallbladder from the liver bed.  Prior to completely detaching the gallbladder from the liver bed, the liver  bed was inspected and several small bleeding points were coagulated with the  electrocautery until the area was completely hemostatic.  The gallbladder  was then detached the rest of the way from the liver bed without difficulty.  The laparoscope was then moved to the epigastric port.  A gallbladder  grasper was placed through the Hasson cannula and used to grasp the neck of  the gallbladder.  The gallbladder with the Hasson cannula was then removed  through the supraumbilical port without difficulty.  The fascial defect was  closed with the previously-placed Vicryl pursestring stitch as well as with  another figure-of-eight 0 Vicryl stitch.  The abdomen was then irrigated  with copious amounts of saline until the effluent was clear.  The rest of  the ports were removed under direct vision and were found to be hemostatic.  The gas was allowed to escape.  The skin incisions were all closed with  interrupted 4-0 Monocryl subcuticular stitches.  Benzoin and Steri-Strips  were applied.  The patient tolerated the procedure well.  At the end of the  case, all needle, sponge and instrument counts were correct.  The patient  was then awakened and taken to the recovery room in stable condition.      PST/MEDQ  D:  11/12/2004  T:  11/13/2004  Job:  [id number]

## 2011-01-31 ENCOUNTER — Encounter: Payer: Self-pay | Admitting: Cardiovascular Disease

## 2011-02-01 ENCOUNTER — Encounter: Payer: Self-pay | Admitting: Cardiology

## 2011-02-01 ENCOUNTER — Ambulatory Visit (INDEPENDENT_AMBULATORY_CARE_PROVIDER_SITE_OTHER): Payer: BC Managed Care – HMO | Admitting: Cardiovascular Disease

## 2011-02-01 ENCOUNTER — Encounter: Payer: Self-pay | Admitting: Cardiovascular Disease

## 2011-02-01 VITALS — BP 190/100 | HR 101 | Resp 17 | Ht 74.0 in | Wt 234.0 lb

## 2011-02-01 DIAGNOSIS — Z720 Tobacco use: Secondary | ICD-10-CM

## 2011-02-01 DIAGNOSIS — I739 Peripheral vascular disease, unspecified: Secondary | ICD-10-CM

## 2011-02-01 DIAGNOSIS — Z87891 Personal history of nicotine dependence: Secondary | ICD-10-CM | POA: Insufficient documentation

## 2011-02-01 DIAGNOSIS — F17200 Nicotine dependence, unspecified, uncomplicated: Secondary | ICD-10-CM

## 2011-02-01 DIAGNOSIS — I10 Essential (primary) hypertension: Secondary | ICD-10-CM | POA: Insufficient documentation

## 2011-02-01 NOTE — Assessment & Plan Note (Signed)
Smoking cessation advised. He is trying to stop.

## 2011-02-01 NOTE — Assessment & Plan Note (Signed)
He has symptoms c/w claudication and reduced ABI on the left. Will arrange distal aortogram with BLE runoff. Risks and benefits reviewed with pt. Will arrange for June 13th in the [facility] lab at [facility].

## 2011-02-01 NOTE — Assessment & Plan Note (Signed)
Elevated. He is on multiple medications. He tells me that he took his meds late today since he slept in late. He also smoked a cigarette walking in. He does not wish to change his therapy.

## 2011-02-01 NOTE — Progress Notes (Signed)
History of Present Illness:55 yo WM with h/o carotid artery disease s/p Left CEA per Dr. Kellie Simmering, DM, HTN, HL, COPD, DJD, GERD here for PV evaluation. He has been seen by Dr. Wilson Singer with [facility]. He has had pain in both legs with walking. Recent ABI in primary care with ABI of 0.75 on the left and 1.18 on the right. He also describes weakness in both legs with walking. NO rest pain or ulcerations. NO chest pain or SOB. His last cardiac cath was 2006 per Dr. Rollene Fare with mild plaque disease.   Past Medical History  Diagnosis Date  . Carotid artery occlusion 11/10/10    LEFT CAROTID ENDARTERECTOMY  . Diabetes mellitus   . Hypertension   . Hyperlipidemia   . COPD (chronic obstructive pulmonary disease)   . DJD (degenerative joint disease)   . GERD (gastroesophageal reflux disease)   . Substernal chest pain   . 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 Slurred speech     AS PER WIFE IN D/C NOTE 11/10/10    Past Surgical History  Procedure Date  . Laminectomy     X 3 LUMBAR AND X 2 CERVICAL SPINE OPERATIONS  . Carotid endarterectomy 11/10/10  . Hernia repair     LEFT INGUINAL AND UMBILICAL REPAIRS  . Anterior fusion cervical spine 02/06/06    C4-5, C5-6, C6-7; SURGEON DR. MAX COHEN  . Penile prosthesis implant 08/14/05    INFRAPUBIC INSERTION OF INFLATABLE PENILE PROSTHESIS; SURGEON DR. Amalia Hailey  . Cardiac catheterization 10/31/04  . Laparoscopic cholecystectomy w/ cholangiography 11/09/04    SURGEON DR. Luella Cook  . Total knee arthroplasty 07/2002    RIGHT KNEE ; SURGEON  DR. [name] ALSO HAD ARTHROSCOPIC RIGHT KNEE IN  10/2001  . Shoulder arthroscopy     Current Outpatient Prescriptions  Medication Sig Dispense Refill  . ALPRAZolam (XANAX) 0.5 MG tablet Take 0.25 mg by mouth 2 (two) times daily.       Marland Kitchen amLODipine-valsartan (EXFORGE) 10-320 MG per tablet Take 1 tablet by mouth daily.        Marland Kitchen aspirin 81 MG EC tablet Take 81 mg by mouth 2 (two)  times daily.       Marland Kitchen atorvastatin (LIPITOR) 20 MG tablet Take 40 mg by mouth daily.       . carvedilol (COREG) 12.5 MG tablet Take 25 mg by mouth 2 (two) times daily with a meal.       . Choline Fenofibrate (TRILIPIX) 135 MG capsule Take 135 mg by mouth daily.        Marland Kitchen ezetimibe (ZETIA) 10 MG tablet Take 10 mg by mouth daily.        Marland Kitchen FLUoxetine (PROZAC) 20 MG capsule Take 20 mg by mouth daily.        Marland Kitchen gabapentin (NEURONTIN) 300 MG capsule Take 300 mg by mouth. 7 per day       . glyBURIDE-metformin (GLUCOVANCE) 2.5-500 MG per tablet Take 2 tablets by mouth 2 (two) times daily with a meal.       . insulin glargine (LANTUS) 100 UNIT/ML injection Inject into the skin at bedtime.       . lansoprazole (PREVACID) 30 MG capsule Take 30 mg by mouth 2 (two) times daily.       Marland Kitchen lidocaine (LIDODERM) 5 % Place 1 patch onto the skin as needed. Remove & Discard patch within 12 hours or as directed by MD       .  metoCLOPramide (REGLAN) 5 MG tablet Take 5 mg by mouth 4 (four) times daily.        Marland Kitchen omega-3 acid ethyl esters (LOVAZA) 1 G capsule Take 4 g by mouth daily.        Marland Kitchen oxyCODONE-acetaminophen (TYLOX) 5-500 MG per capsule Take 1 capsule by mouth every 4 (four) hours as needed.        Marland Kitchen rOPINIRole (REQUIP) 2 MG tablet Take 2 mg by mouth at bedtime.        . saxagliptin HCl (ONGLYZA) 5 MG TABS tablet Take 5 mg by mouth daily.        . traMADol (ULTRAM) 50 MG tablet Take 50 mg by mouth every 6 (six) hours as needed.        . triamterene-hydrochlorothiazide (DYAZIDE) 37.5-25 MG per capsule Take 1 capsule by mouth every morning.        Marland Kitchen DISCONTD: clopidogrel (PLAVIX) 75 MG tablet Take 75 mg by mouth daily.        Marland Kitchen DISCONTD: furosemide (LASIX) 20 MG tablet Take 20 mg by mouth daily.        Marland Kitchen DISCONTD: potassium chloride (KLOR-CON) 10 MEQ CR tablet Take 10 mEq by mouth 2 (two) times daily.          No Known Allergies  History   Social History  . Marital Status: Married    Spouse Name: N/A     Number of Children: N/A  . Years of Education: N/A   Occupational History  . TRUCK DRIVER    Social History Main Topics  . Smoking status: Current Everyday Smoker -- 1.0 packs/day  . Smokeless tobacco: Not on file  . Alcohol Use: No  . Drug Use:   . Sexually Active:    Other Topics Concern  . Not on file   Social History Narrative   HEAVY SMOKER AND CONTINUES TO SMOKE 1 PPDDOES  NOT EXERCISE REGULARLY    Family History  Problem Relation Age of Onset  . Heart disease Father   . Diabetes Father   . Cancer Father     PROSTATE  . Arthritis      GRANDMOTHER  . Hypertension      OTHER FAMILY MEMBERS    Review of Systems:  As stated in the HPI and otherwise negative.   BP 190/100  Pulse 101  Resp 17  Ht 6\' 2"  (1.88 m)  Wt 234 lb (106.142 kg)  BMI 30.04 kg/m2  Physical Examination: General: Well developed, well nourished, NAD HEENT: OP clear, mucus membranes moist SKIN: warm, dry. No rashes. Neuro: No focal deficits Musculoskeletal: Muscle strength 5/5 all ext Psychiatric: Mood and affect normal Neck: No JVD, no carotid bruits, no thyromegaly, no lymphadenopathy. Lungs:Clear bilaterally, no wheezes, rhonci, crackles Cardiovascular: Regular rate and rhythm. No murmurs, gallops or rubs. Abdomen:Soft. Bowel sounds present. Non-tender.  Extremities: No lower extremity edema. Pulses are 2 + in the bilateral DP/PT.  EKG: NSR, rate 98 bpm.  LAFB. LVH. Poor R wave progression. Non-specific T wave changes.

## 2011-02-01 NOTE — Patient Instructions (Signed)
Your physician recommends that you schedule a follow-up appointment in: 4-6 weeks with Dr. Angelena Form  Your physician has requested that you have a peripheral vascular angiogram. This exam is performed at the hospital. During this exam IV contrast is used to look at arterial blood flow. Please review the information sheet given for details.

## 2011-02-19 ENCOUNTER — Other Ambulatory Visit (INDEPENDENT_AMBULATORY_CARE_PROVIDER_SITE_OTHER): Payer: BC Managed Care – HMO | Admitting: *Deleted

## 2011-02-19 ENCOUNTER — Other Ambulatory Visit: Payer: Self-pay | Admitting: *Deleted

## 2011-02-19 DIAGNOSIS — M79609 Pain in unspecified limb: Secondary | ICD-10-CM

## 2011-02-19 DIAGNOSIS — I10 Essential (primary) hypertension: Secondary | ICD-10-CM

## 2011-02-19 DIAGNOSIS — Z01810 Encounter for preprocedural cardiovascular examination: Secondary | ICD-10-CM

## 2011-02-19 LAB — BASIC METABOLIC PANEL
BUN: 6 mg/dL (ref 6–23)
CO2: 26 mEq/L (ref 19–32)
Calcium: 8.7 mg/dL (ref 8.4–10.5)
Chloride: 100 mEq/L (ref 96–112)
Creatinine, Ser: 0.6 mg/dL (ref 0.4–1.5)
GFR: 139.66 mL/min (ref >60.00)
Glucose, Bld: 285 mg/dL — ABNORMAL HIGH (ref 70–99)
Potassium: 3.3 mEq/L — ABNORMAL LOW (ref 3.5–5.1)
Sodium: 135 mEq/L (ref 135–145)

## 2011-02-19 LAB — CBC WITH DIFFERENTIAL/PLATELET
Basophils Absolute: 0 10*3/uL (ref 0.0–0.1)
Basophils Relative: 0.6 % (ref 0.0–3.0)
Eosinophils Absolute: 0.3 10*3/uL (ref 0.0–0.7)
Eosinophils Relative: 3.8 % (ref 0.0–5.0)
HCT: 39.8 % (ref 39.0–52.0)
Hemoglobin: 14.1 g/dL (ref 13.0–17.0)
Lymphocytes Relative: 25.4 % (ref 12.0–46.0)
Lymphs Abs: 1.6 10*3/uL (ref 0.7–4.0)
MCHC: 35.6 g/dL (ref 30.0–36.0)
MCV: 88.2 fl (ref 78.0–100.0)
Monocytes Absolute: 0.5 10*3/uL (ref 0.1–1.0)
Monocytes Relative: 7.9 % (ref 3.0–12.0)
Neutro Abs: 4 10*3/uL (ref 1.4–7.7)
Neutrophils Relative %: 62.3 % (ref 43.0–77.0)
Platelets: 188 10*3/uL (ref 150.0–400.0)
RBC: 4.51 Mil/uL (ref 4.22–5.81)
RDW: 12.7 % (ref 11.5–14.6)
WBC: 6.5 10*3/uL (ref 4.5–10.5)

## 2011-02-19 LAB — PROTIME-INR
INR: 1 ratio (ref 0.8–1.0)
Prothrombin Time: 11.2 s (ref 10.2–12.4)

## 2011-02-20 ENCOUNTER — Telehealth: Payer: Self-pay | Admitting: Cardiology

## 2011-02-20 DIAGNOSIS — E876 Hypokalemia: Secondary | ICD-10-CM

## 2011-02-20 MED ORDER — POTASSIUM CHLORIDE CRYS ER 20 MEQ PO TBCR: EXTENDED_RELEASE_TABLET | ORAL | Status: DC

## 2011-02-20 NOTE — Telephone Encounter (Signed)
Potassium called into pharmacy

## 2011-02-21 ENCOUNTER — Ambulatory Visit (HOSPITAL_COMMUNITY)
Admission: RE | Admit: 2011-02-21 | Discharge: 2011-02-21 | Disposition: A | Discharge status: Home / Self Care | Payer: BC Managed Care – HMO | Source: Ambulatory Visit | Attending: Cardiovascular Disease | Admitting: Cardiovascular Disease

## 2011-02-21 DIAGNOSIS — I708 Atherosclerosis of other arteries: Secondary | ICD-10-CM | POA: Insufficient documentation

## 2011-02-21 DIAGNOSIS — I70219 Atherosclerosis of native arteries of extremities with intermittent claudication, unspecified extremity: Secondary | ICD-10-CM

## 2011-02-21 LAB — GLUCOSE, CAPILLARY
Glucose-Capillary: 318 mg/dL — ABNORMAL HIGH (ref 70–99)
Glucose-Capillary: 254 mg/dL — ABNORMAL HIGH (ref 70–99)
Glucose-Capillary: 365 mg/dL — ABNORMAL HIGH (ref 70–99)
Glucose-Capillary: 318 mg/dL — ABNORMAL HIGH (ref 70–99)

## 2011-02-21 LAB — POTASSIUM: Potassium: 3.3 mEq/L — ABNORMAL LOW (ref 3.5–5.1)

## 2011-02-22 NOTE — Procedures (Signed)
[name], [name]                 ACCOUNT NO.:  [id number]  MEDICAL RECORD NO.:  [id number]  LOCATION:  [facility]                         FACILITY:  [facility]  PHYSICIAN:  Lauree Chandler, MDDATE OF BIRTH:  11-18-1955  DATE OF PROCEDURE:  02/21/2011 DATE OF DISCHARGE:  02/21/2011                   PERIPHERAL VASCULAR INVASIVE PROCEDURE   PRIMARY CARE PHYSICIAN:  Ronaldo Miyamoto, MD  PROCEDURE PERFORMED:  Distal aortogram with bilateral lower extremity runoff.  OPERATOR:  Lauree Chandler, MD  INDICATIONS:  This is a 55 year old Caucasian male with history of hypertension, hyperlipidemia, diabetes mellitus, carotid artery disease, and ongoing tobacco abuse who I last saw in the office 3 weeks ago with complaints of bilateral lower extremity pain with ambulation, consistent with claudication.  Recent ABIs with his primary care showed an ABI of 0.75 on the left and 1.18 on the right.  The patient describes pain in both legs with walking.  There seems to be mild worsening of the pain in the left lower extremity.  Diagnostic angiogram was arranged for today.  DETAILS OF PROCEDURE:  The patient was brought to the main peripheral vascular laboratory after signing informed consent for the procedure. The right groin was prepped and draped in sterile fashion.  A 1% lidocaine was used for local anesthesia.  A 5-French sheath was inserted into the right femoral artery without difficulty.  A pigtail catheter was advanced over a wire into the distal aorta just above the takeoff of the bilateral renal arteries.  The first angiogram visualized the bilateral renal arteries as well as the distal aorta.  I then pulled pigtail catheter down to the level of the aortic bifurcation and performed angiography of the iliac vessels.  Because the patient had prior back surgery with hardware present, we performed angulated views to visualize the iliac vessels.  We then performed runoff through both lower  extremities.  The patient also has had a knee replacement which made visualization of the popliteal artery difficult.  Several angulated shots were taken of the right lower extremity below the knee.  We wanted to get better imaging of the below-the-knee vessels of the left lower extremity, so a crossover catheter was used to crossover into the left iliac system.  An end-hole catheter was then used to perform angiography of the left lower extremity in a selective fashion.  The patient tolerated the procedure well.  He was taken to the recovery area in stable condition.  HEMODYNAMIC FINDINGS:  Central aortic pressure 176/89.  ANGIOGRAPHIC FINDINGS: 1. The bilateral renal arteries are patent with no evidence of     obstructive disease. 2. The distal aorta is patent without plaque or aneurysm noted. 3. The left common iliac artery has a mild 20% plaque.  The remainder     of the left external iliac artery, internal iliac artery, common     femoral artery is patent with no evidence of disease.  The left     superficial femoral artery has mild plaque in the proximal mid     segment with no obstructive lesions.  The distal SFA just at that     juncture with the popliteal artery has a 90% discrete stenosis.     There is  3-vessel runoff to the left foot, however, the anterior     tibial artery has a proximal 90% stenosis. 4. The right common iliac artery, internal iliac artery, external     iliac artery, and common femoral artery are patent without     obstructive disease.  The right superficial femoral artery has mild     plaque in the proximal mid and distal portion.  The right ATA is     occluded proximally and reconstitutes distally from collateral     vessels.  The right PTA has proximal 70% serial lesions.  The right     peroneal artery has plaque disease.  IMPRESSION:  Severe peripheral arterial disease as described above.  RECOMMENDATIONS:  The patient has a focal lesion in the  distal left SFA that should be amenable to percutaneous therapy.  We will arrange to bring the patient back at a later date for an atherectomy of this lesion in the distal left superficial femoral artery.  At this time, I would recommend medical management of the below-the-knee disease in this patient who continues to smoke.     Lauree Chandler, MD     CM/MEDQ  D:  02/21/2011  T:  02/22/2011  Job:  [id number]  cc:   Ronaldo Miyamoto, M.D.  Electronically Signed by Lauree Chandler MD on 02/22/2011 01:50:28 PM

## 2011-02-23 ENCOUNTER — Encounter: Payer: Self-pay | Admitting: Cardiology

## 2011-03-08 ENCOUNTER — Encounter: Payer: Self-pay | Admitting: Cardiovascular Disease

## 2011-03-12 ENCOUNTER — Encounter: Payer: Self-pay | Admitting: Cardiovascular Disease

## 2011-03-16 ENCOUNTER — Ambulatory Visit: Payer: Medicare Other | Admitting: Cardiovascular Disease

## 2011-03-19 ENCOUNTER — Other Ambulatory Visit (INDEPENDENT_AMBULATORY_CARE_PROVIDER_SITE_OTHER): Payer: BC Managed Care – HMO | Admitting: *Deleted

## 2011-03-19 DIAGNOSIS — I10 Essential (primary) hypertension: Secondary | ICD-10-CM

## 2011-03-19 DIAGNOSIS — I739 Peripheral vascular disease, unspecified: Secondary | ICD-10-CM

## 2011-03-19 DIAGNOSIS — F17200 Nicotine dependence, unspecified, uncomplicated: Secondary | ICD-10-CM

## 2011-03-19 DIAGNOSIS — Z720 Tobacco use: Secondary | ICD-10-CM

## 2011-03-20 LAB — CBC WITH DIFFERENTIAL/PLATELET
Basophils Absolute: 0 10*3/uL (ref 0.0–0.1)
Basophils Relative: 0.4 % (ref 0.0–3.0)
Eosinophils Absolute: 0.3 10*3/uL (ref 0.0–0.7)
Eosinophils Relative: 4 % (ref 0.0–5.0)
HCT: 40.7 % (ref 39.0–52.0)
Hemoglobin: 14.3 g/dL (ref 13.0–17.0)
Lymphocytes Relative: 25 % (ref 12.0–46.0)
Lymphs Abs: 2 10*3/uL (ref 0.7–4.0)
MCHC: 35.2 g/dL (ref 30.0–36.0)
MCV: 89.7 fl (ref 78.0–100.0)
Monocytes Absolute: 0.6 10*3/uL (ref 0.1–1.0)
Monocytes Relative: 7.7 % (ref 3.0–12.0)
Neutro Abs: 5 10*3/uL (ref 1.4–7.7)
Neutrophils Relative %: 62.9 % (ref 43.0–77.0)
Platelets: 194 10*3/uL (ref 150.0–400.0)
RBC: 4.54 Mil/uL (ref 4.22–5.81)
RDW: 13.1 % (ref 11.5–14.6)
WBC: 7.9 10*3/uL (ref 4.5–10.5)

## 2011-03-20 LAB — PROTIME-INR
INR: 1 ratio (ref 0.8–1.0)
Prothrombin Time: 11.6 s (ref 10.2–12.4)

## 2011-03-20 LAB — BASIC METABOLIC PANEL
BUN: 8 mg/dL (ref 6–23)
CO2: 27 mEq/L (ref 19–32)
Calcium: 9 mg/dL (ref 8.4–10.5)
Chloride: 101 mEq/L (ref 96–112)
Creatinine, Ser: 0.6 mg/dL (ref 0.4–1.5)
GFR: 153.6 mL/min (ref >60.00)
Glucose, Bld: 293 mg/dL — ABNORMAL HIGH (ref 70–99)
Potassium: 3.7 mEq/L (ref 3.5–5.1)
Sodium: 137 mEq/L (ref 135–145)

## 2011-03-21 ENCOUNTER — Ambulatory Visit (HOSPITAL_COMMUNITY)
Admission: RE | Admit: 2011-03-21 | Discharge: 2011-03-23 | Disposition: A | Discharge status: Home / Self Care | Payer: BC Managed Care – HMO | Source: Ambulatory Visit | Attending: Cardiovascular Disease | Admitting: Cardiovascular Disease

## 2011-03-21 DIAGNOSIS — Z01810 Encounter for preprocedural cardiovascular examination: Secondary | ICD-10-CM | POA: Insufficient documentation

## 2011-03-21 DIAGNOSIS — J4489 Other specified chronic obstructive pulmonary disease: Secondary | ICD-10-CM | POA: Insufficient documentation

## 2011-03-21 DIAGNOSIS — R079 Chest pain, unspecified: Secondary | ICD-10-CM | POA: Insufficient documentation

## 2011-03-21 DIAGNOSIS — I70219 Atherosclerosis of native arteries of extremities with intermittent claudication, unspecified extremity: Secondary | ICD-10-CM

## 2011-03-21 DIAGNOSIS — E785 Hyperlipidemia, unspecified: Secondary | ICD-10-CM | POA: Insufficient documentation

## 2011-03-21 DIAGNOSIS — E119 Type 2 diabetes mellitus without complications: Secondary | ICD-10-CM | POA: Insufficient documentation

## 2011-03-21 DIAGNOSIS — J449 Chronic obstructive pulmonary disease, unspecified: Secondary | ICD-10-CM | POA: Insufficient documentation

## 2011-03-21 DIAGNOSIS — I10 Essential (primary) hypertension: Secondary | ICD-10-CM | POA: Insufficient documentation

## 2011-03-21 DIAGNOSIS — I70209 Unspecified atherosclerosis of native arteries of extremities, unspecified extremity: Secondary | ICD-10-CM | POA: Insufficient documentation

## 2011-03-21 DIAGNOSIS — Z01812 Encounter for preprocedural laboratory examination: Secondary | ICD-10-CM | POA: Insufficient documentation

## 2011-03-21 LAB — CBC
HCT: 33.1 % — ABNORMAL LOW (ref 39.0–52.0)
Hemoglobin: 12.3 g/dL — ABNORMAL LOW (ref 13.0–17.0)
MCH: 30.4 pg (ref 26.0–34.0)
MCHC: 37.2 g/dL — ABNORMAL HIGH (ref 30.0–36.0)
MCV: 81.9 fL (ref 78.0–100.0)
Platelets: 162 10*3/uL (ref 150–400)
RBC: 4.04 MIL/uL — ABNORMAL LOW (ref 4.22–5.81)
RDW: 12.6 % (ref 11.5–15.5)
WBC: 7.3 10*3/uL (ref 4.0–10.5)

## 2011-03-21 LAB — CK TOTAL AND CKMB (NOT AT ARMC)
CK, MB: 1.8 ng/mL (ref 0.3–4.0)
Relative Index: INVALID (ref 0.0–2.5)
Total CK: 42 U/L (ref 7–232)

## 2011-03-21 LAB — CARDIAC PANEL(CRET KIN+CKTOT+MB+TROPI)
CK, MB: 1.7 ng/mL (ref 0.3–4.0)
Relative Index: INVALID (ref 0.0–2.5)
Total CK: 44 U/L (ref 7–232)
Troponin I: <0.3 ng/mL (ref <0.30)

## 2011-03-21 LAB — BASIC METABOLIC PANEL
BUN: 6 mg/dL (ref 6–23)
CO2: 26 mEq/L (ref 19–32)
Calcium: 8.3 mg/dL — ABNORMAL LOW (ref 8.4–10.5)
Chloride: 102 mEq/L (ref 96–112)
Creatinine, Ser: <0.47 mg/dL — ABNORMAL LOW (ref 0.50–1.35)
Glucose, Bld: 159 mg/dL — ABNORMAL HIGH (ref 70–99)
Potassium: 3.2 mEq/L — ABNORMAL LOW (ref 3.5–5.1)
Sodium: 136 mEq/L (ref 135–145)

## 2011-03-21 LAB — GLUCOSE, CAPILLARY
Glucose-Capillary: 258 mg/dL — ABNORMAL HIGH (ref 70–99)
Glucose-Capillary: 149 mg/dL — ABNORMAL HIGH (ref 70–99)
Glucose-Capillary: 147 mg/dL — ABNORMAL HIGH (ref 70–99)
Glucose-Capillary: 206 mg/dL — ABNORMAL HIGH (ref 70–99)

## 2011-03-21 LAB — TROPONIN I: Troponin I: <0.3 ng/mL (ref <0.30)

## 2011-03-22 ENCOUNTER — Ambulatory Visit (HOSPITAL_COMMUNITY): Payer: BC Managed Care – HMO

## 2011-03-22 DIAGNOSIS — I739 Peripheral vascular disease, unspecified: Secondary | ICD-10-CM | POA: Insufficient documentation

## 2011-03-22 LAB — BASIC METABOLIC PANEL
BUN: 9 mg/dL (ref 6–23)
CO2: 26 mEq/L (ref 19–32)
Calcium: 8.4 mg/dL (ref 8.4–10.5)
Chloride: 101 mEq/L (ref 96–112)
Creatinine, Ser: 0.62 mg/dL (ref 0.50–1.35)
GFR calc Af Amer: >60 mL/min (ref >60)
GFR calc non Af Amer: >60 mL/min (ref >60)
Glucose, Bld: 159 mg/dL — ABNORMAL HIGH (ref 70–99)
Potassium: 3.4 mEq/L — ABNORMAL LOW (ref 3.5–5.1)
Sodium: 136 mEq/L (ref 135–145)

## 2011-03-22 LAB — CBC
HCT: 34.2 % — ABNORMAL LOW (ref 39.0–52.0)
Hemoglobin: 12.6 g/dL — ABNORMAL LOW (ref 13.0–17.0)
MCH: 30.7 pg (ref 26.0–34.0)
MCHC: 36.8 g/dL — ABNORMAL HIGH (ref 30.0–36.0)
MCV: 83.2 fL (ref 78.0–100.0)
Platelets: 153 10*3/uL (ref 150–400)
RBC: 4.11 MIL/uL — ABNORMAL LOW (ref 4.22–5.81)
RDW: 12.7 % (ref 11.5–15.5)
WBC: 6 10*3/uL (ref 4.0–10.5)

## 2011-03-22 LAB — CARDIAC PANEL(CRET KIN+CKTOT+MB+TROPI)
CK, MB: 1.7 ng/mL (ref 0.3–4.0)
Relative Index: INVALID (ref 0.0–2.5)
Total CK: 38 U/L (ref 7–232)
Troponin I: <0.3 ng/mL (ref <0.30)

## 2011-03-22 LAB — GLUCOSE, CAPILLARY
Glucose-Capillary: 237 mg/dL — ABNORMAL HIGH (ref 70–99)
Glucose-Capillary: 127 mg/dL — ABNORMAL HIGH (ref 70–99)
Glucose-Capillary: 257 mg/dL — ABNORMAL HIGH (ref 70–99)
Glucose-Capillary: 405 mg/dL — ABNORMAL HIGH (ref 70–99)

## 2011-03-22 LAB — POCT ACTIVATED CLOTTING TIME
Activated Clotting Time: 166 seconds
Activated Clotting Time: 408 seconds

## 2011-03-22 NOTE — Procedures (Signed)
  [name], [name]                 ACCOUNT NO.:  [id number]  MEDICAL RECORD NO.:  [id number]  LOCATION:  [id number]                         FACILITY:  [facility]  PHYSICIAN:  Lauree Chandler, MDDATE OF BIRTH:  Dec 20, 1955  DATE OF PROCEDURE:  03/21/2011 DATE OF DISCHARGE:                   PERIPHERAL VASCULAR INVASIVE PROCEDURE   PRIMARY CARE PHYSICIAN:  Ronaldo Miyamoto, MD  PROCEDURE PERFORMED: 1. Left lower extremity angiogram. 2. Atherectomy of the left popliteal artery with a silver hawk     atherectomy device.  OPERATOR:  Lauree Chandler, MD  INDICATIONS:  This is a 55 year old gentleman with a history of carotid artery stenosis status post left carotid endarterectomy, diabetes mellitus, hypertension, hyperlipidemia, COPD, and peripheral arterial disease who underwent an angiogram of the bilateral lower extremities in the middle of June 2012.  His ABI was reduced to 0.75 in the left lower extremity.  The right lower extremity had normal ABIs.  He was found to have a severe stenosis in the left popliteal artery.  Plans were made today for atherectomy of the severe stenosis in the left popliteal artery.  DETAILS OF PROCEDURE:  The patient was brought to the main cardiac catheterization laboratory after signing informed consent for the procedure.  The right groin was prepped and draped in sterile fashion. 1% lidocaine was used for local anesthesia.  Initially we inserted a 6- French sheath into the right femoral artery.  We then used a crossover catheter to engage the left iliac system from the contralateral approach.  We then passed a 7-French Terumo destination sheath around the aortic bifurcation and positioned this distally in the left common femoral artery.  At this point, the patient was given 5000 units of heparin.  However, after 10 minutes the ACT was not therapeutic.  We elected to switch to Angiomax for anticoagulation.  After we had a therapeutic ACT, we passed  Spartacore wire down into the distal left lower extremity.  We then used a silver hawk atherectomy device and made multiple passes throughout the area of severe stenosis in the left popliteal artery.  With the atherectomy device, we were able to shave away the blockage and had an excellent result.  The stenosis was taken from 95% to 0%.  There was excellent flow into the distal vessel.  There were no immediate complications.  The patient tolerated the procedure well.  He was taken to the recovery area in stable condition.  IMPRESSION:  Severe peripheral arterial disease in left lower extremity now status post successful atherectomy of the left popliteal artery.  RECOMMENDATIONS:  The patient was loaded with 600 mg of Plavix.  He will be continued on aspirin 81 mg once daily and Plavix 75 mg once daily. He is strongly encouraged to stop smoking completely.  We will plan on discharging him home today after 4 hours of bedrest.  We will call the patient with a followup appointment in our office.     Lauree Chandler, MD     CM/MEDQ  D:  03/21/2011  T:  03/21/2011  Job:  [id number]  cc:   Ronaldo Miyamoto, M.D.  Electronically Signed by Lauree Chandler MD on 03/22/2011 10:31:45 PM

## 2011-03-23 ENCOUNTER — Ambulatory Visit (HOSPITAL_COMMUNITY): Payer: BC Managed Care – HMO

## 2011-03-23 ENCOUNTER — Ambulatory Visit (HOSPITAL_COMMUNITY): Admit: 2011-03-23 | Payer: BC Managed Care – HMO

## 2011-03-23 ENCOUNTER — Encounter: Payer: Self-pay | Admitting: Gastroenterology

## 2011-03-23 LAB — GLUCOSE, CAPILLARY
Glucose-Capillary: 346 mg/dL — ABNORMAL HIGH (ref 70–99)
Glucose-Capillary: 223 mg/dL — ABNORMAL HIGH (ref 70–99)
Glucose-Capillary: 192 mg/dL — ABNORMAL HIGH (ref 70–99)

## 2011-03-23 LAB — BASIC METABOLIC PANEL
BUN: 8 mg/dL (ref 6–23)
CO2: 31 mEq/L (ref 19–32)
Calcium: 9.1 mg/dL (ref 8.4–10.5)
Chloride: 103 mEq/L (ref 96–112)
Creatinine, Ser: 0.86 mg/dL (ref 0.50–1.35)
GFR calc Af Amer: >60 mL/min (ref >60)
GFR calc non Af Amer: >60 mL/min (ref >60)
Glucose, Bld: 205 mg/dL — ABNORMAL HIGH (ref 70–99)
Potassium: 4.1 mEq/L (ref 3.5–5.1)
Sodium: 140 mEq/L (ref 135–145)

## 2011-03-23 LAB — CBC
HCT: 34.2 % — ABNORMAL LOW (ref 39.0–52.0)
Hemoglobin: 12.6 g/dL — ABNORMAL LOW (ref 13.0–17.0)
MCH: 30.7 pg (ref 26.0–34.0)
MCHC: 36.8 g/dL — ABNORMAL HIGH (ref 30.0–36.0)
MCV: 83.2 fL (ref 78.0–100.0)
Platelets: 156 10*3/uL (ref 150–400)
RBC: 4.11 MIL/uL — ABNORMAL LOW (ref 4.22–5.81)
RDW: 12.7 % (ref 11.5–15.5)
WBC: 6.2 10*3/uL (ref 4.0–10.5)

## 2011-03-23 MED ORDER — TECHNETIUM TC 99M TETROFOSMIN IV KIT
30.0000 | PACK | Freq: Once | INTRAVENOUS | Status: AC | PRN
  Administered 2011-03-23: 30 via INTRAVENOUS

## 2011-03-27 ENCOUNTER — Telehealth: Payer: Self-pay | Admitting: Cardiovascular Disease

## 2011-03-27 NOTE — Discharge Summary (Signed)
[name], OHR NO.:  [id number]  MEDICAL RECORD NO.:  [id number]  LOCATION:  [id number]                         FACILITY:  [facility]  PHYSICIAN:  Lauree Chandler, MDDATE OF BIRTH:  Aug 10, 1956  DATE OF ADMISSION:  03/21/2011 DATE OF DISCHARGE:  03/23/2011                              DISCHARGE SUMMARY   PRIMARY CARE PROVIDER:  Ronaldo Miyamoto, MD  PRIMARY CARDIOLOGIST:  Richard A. Rollene Fare, MD  DISCHARGE DIAGNOSES: 1. Atypical chest pain with no evidence of myocardial ischemia per     Lexiscan Myoview this admission. 2. Peripheral artery disease, status post arthrectomy of the left     popliteal artery with a SilverHawk atherectomy device on March 21, 2011. 3. Hypertension. 4. Severe gastroesophageal reflux disease/Barrett's.  SECONDARY DIAGNOSES: 1. Carotid artery occlusion, status post left carotid endarterectomy     in March 2012. 2. Insulin-dependent diabetes mellitus. 3. Hyperlipidemia. 4. Chronic obstructive pulmonary disease.  PROCEDURES/DIAGNOSTICS PERFORMED DURING HOSPITALIZATION: 1. Left lower extremity angiogram with atherectomy at the left     popliteal artery. 2. Lexiscan Myoview demonstrating no evidence of myocardial ischemia.     There is diaphragmatic attenuation favored over scarring in the     distal inferior wall.  No focal left ventricular wall motion     abnormalities.  Estimated ejection fraction 51%.  REASON FOR HOSPITALIZATION:  This is a 55 year old Caucasian gentleman with the above-stated problem list who is evaluated by Dr. Angelena Form for his peripheral artery disease.  He underwent angiogram of the bilateral lower extremities in the middle of June, his ABI was reduced to 0.75 in the left lower extremity with normal ABIs in the right lower extremity. He was found to have severe stenosis in the left popliteal artery and plans were made for atherectomy in the left popliteal artery at the severe stenosis.  Risks, benefits,  and alternatives were discussed with the patient and he agreed to proceed.  HOSPITAL COURSE:  The patient presented to [facility] on March 21, 2011.  He was taken to the [facility] by Dr. Angelena Form.  Informed consent was obtained. Dr. Angelena Form performed successful atherectomy of the left popliteal artery with a SilverHawk atherectomy device.  The patient was loaded with 600 mg of Plavix and was to continue on aspirin daily as well as Plavix for at least 30 days.  It has been planned for discharge 4 hours after bedrest.  At that time, the patient began to have chest pain in the recovery room.  An EKG was obtained that showed nonspecific T-wave changes.  The patient was then admitted to Step-Down Unit and placed on IV nitroglycerin as well as continued on his aspirin, Plavix as well as beta-blocker and statin.  The patient's cardiac markers remained negative x3.  His episode of chest pain lasted hours, but did seem a pleuritic in nature.  Therefore, it was felt the patient should proceed with [facility] for further risk stratification.  On March 23, 2011, the patient underwent with a Lexiscan Myoview. Results showed no evidence of ischemia.  Ejection fraction was 51%.  It was felt the patient's chest pain is most likely related to his severe  reflux disease as well as history of Barrett's.  At this time, the patient has been recommended to follow up with GI for further evaluation.  On the day of discharge, Dr. Angelena Form evaluated the patient and noted him stable for home.  Tobacco cessation has been instructed.  The patient does voice understanding and states he is thinking of quitting. Of note, the patient states his Dyazide does make him nauseous, therefore this has been discontinued and he will be continued on the hydrochlorothiazide.  Further treatment will dependent upon the outpatient setting.  DISCHARGE LABORATORY DATA:  Sodium 140, potassium 4.1, BUN 8, and creatinine 0.86.  Hemoglobin  12.6 and hematocrit 34.2.  Cardiac enzymes negative x3.  FOLLOWUP PLANS AND INSTRUCTIONS: 1. The patient will follow up with Dr. Angelena Form on April 12, 2011, at     3 p.m.  He is to arrive at 2 p.m. to have relook ABIs. 2. The patient is to follow up with Dr. Sharlett Iles at [facility] on     May 01, 2011, at 9 a.m. 3. The patient is to follow up with Dr. Wilson Singer as previously scheduled. 4. The patient is to increase activity as tolerated.  He may shower,     but no bathing.  No lifting for 1 week greater than 5 pounds.  No     driving for 2 days.  No sexual activity for 1 week.  He is to keep     his cath site clean and dry and call the office for any problems. 5. The patient is to continue on a low-sodium, heart-healthy diet. 6. The patient is to avoid straining. 7. The patient is to call office in the interim for any problems or     concerns.  DURATION OF DISCHARGE:  Greater than 30 minutes with physician and physician extender time.     Cecille Amsterdam, PA-C   ______________________________ Lauree Chandler, MD    NB/MEDQ  D:  03/23/2011  T:  03/24/2011  Job:  [id number]  cc:   Ronaldo Miyamoto, M.D. Richard A. Rollene Fare, M.D.  Electronically Signed by Pennie Rushing P.A. on 03/27/2011 11:23:27 AM Electronically Signed by Lauree Chandler MD on 03/27/2011 05:56:51 PM

## 2011-03-27 NOTE — Telephone Encounter (Signed)
Pt had procedure on July 11 and some questions about same.  Please call him at [phone].

## 2011-03-27 NOTE — Telephone Encounter (Signed)
Patient had questions regarding procedure. He wanted clarification regarding his intervention during his recent PV procedure. Reviewed the procedural report with him.He will see Dr. Angelena Form in a few weeks.

## 2011-04-12 ENCOUNTER — Encounter: Payer: BC Managed Care – HMO | Admitting: Cardiovascular Disease

## 2011-04-12 ENCOUNTER — Encounter: Payer: BC Managed Care – HMO | Admitting: *Deleted

## 2011-04-17 ENCOUNTER — Ambulatory Visit (INDEPENDENT_AMBULATORY_CARE_PROVIDER_SITE_OTHER): Payer: BC Managed Care – HMO | Admitting: Cardiovascular Disease

## 2011-04-17 ENCOUNTER — Encounter: Payer: Self-pay | Admitting: Cardiovascular Disease

## 2011-04-17 VITALS — BP 160/104 | HR 100 | Ht 74.0 in | Wt 226.0 lb

## 2011-04-17 DIAGNOSIS — I739 Peripheral vascular disease, unspecified: Secondary | ICD-10-CM

## 2011-04-17 DIAGNOSIS — F17200 Nicotine dependence, unspecified, uncomplicated: Secondary | ICD-10-CM

## 2011-04-17 DIAGNOSIS — Z720 Tobacco use: Secondary | ICD-10-CM

## 2011-04-17 NOTE — Progress Notes (Signed)
History of Present Illness:55 yo WM with h/o carotid artery disease s/p Left CEA per Dr. Kellie Simmering, DM, HTN, HL, COPD, DJD, GERD here for PV follow up. I saw him May 24th 2012 for new PV evaluation. He has been seen by Dr. Wilson Singer with [facility]. He has had pain in both legs with walking.  ABI in primary care with ABI of 0.75 on the left and 1.18 on the right. He also described weakness in both legs with walking. NO rest pain or ulcerations. NO chest pain or SOB. His last cardiac cath was 2006 per Dr. Rollene Fare with mild plaque disease.  I arranged a distal aortogram at [facility] on February 21, 2011. He was found to have a severe 90% left popliteal artery stenosis. He was brought back on July 11th 2012 for an atherectomy of the left popliteal artery. He is here for f/u. He did not have his f/u ABI today. He has been told by Dr. Wilson Singer that he should be seen by Dr. Kellie Simmering for possible LE bypass. He has no LE ulcerations. He does describe pain at rest although there are no ischemic signs. He is still having claudication with walking short distances. This does not seem to be better following his atherectomy.  His cardiac issues are followed by Dr. Rollene Fare in [facility].   Past Medical History  Diagnosis Date  . Carotid artery occlusion 11/10/10    LEFT CAROTID ENDARTERECTOMY  . Diabetes mellitus   . Hypertension   . Hyperlipidemia   . COPD (chronic obstructive pulmonary disease)   . DJD (degenerative joint disease)   . GERD (gastroesophageal reflux disease)   . Substernal chest pain   . 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 Slurred speech     AS PER WIFE IN D/C NOTE 11/10/10  . PAD (peripheral artery disease)     Distal aortogram June 2012. Atherectomy left popliteal artery July 2012.     Past Surgical History  Procedure Date  . Laminectomy     X 3 LUMBAR AND X 2 CERVICAL SPINE OPERATIONS  . Carotid endarterectomy 11/10/10  .  Hernia repair     LEFT INGUINAL AND UMBILICAL REPAIRS  . Anterior fusion cervical spine 02/06/06    C4-5, C5-6, C6-7; SURGEON DR. MAX COHEN  . Penile prosthesis implant 08/14/05    INFRAPUBIC INSERTION OF INFLATABLE PENILE PROSTHESIS; SURGEON DR. Amalia Hailey  . Cardiac catheterization 10/31/04  . Laparoscopic cholecystectomy w/ cholangiography 11/09/04    SURGEON DR. Luella Cook  . Total knee arthroplasty 07/2002    RIGHT KNEE ; SURGEON  DR. [name] ALSO HAD ARTHROSCOPIC RIGHT KNEE IN  10/2001  . Shoulder arthroscopy     Current Outpatient Prescriptions  Medication Sig Dispense Refill  . ALPRAZolam (XANAX) 0.5 MG tablet Take 0.25 mg by mouth 2 (two) times daily.       Marland Kitchen amLODipine-valsartan (EXFORGE) 10-320 MG per tablet Take 1 tablet by mouth daily.        Marland Kitchen aspirin 81 MG EC tablet Take 81 mg by mouth 2 (two) times daily.       Marland Kitchen atorvastatin (LIPITOR) 20 MG tablet Take 40 mg by mouth daily.       . carvedilol (COREG) 12.5 MG tablet Take 25 mg by mouth 2 (two) times daily with a meal.       . Choline Fenofibrate (TRILIPIX) 135 MG capsule Take 135 mg by mouth daily.        Marland Kitchen  ezetimibe (ZETIA) 10 MG tablet Take 10 mg by mouth daily.        Marland Kitchen FLUoxetine (PROZAC) 20 MG capsule Take 20 mg by mouth daily.        Marland Kitchen gabapentin (NEURONTIN) 300 MG capsule Take 300 mg by mouth. 7 per day       . glyBURIDE-metformin (GLUCOVANCE) 2.5-500 MG per tablet Take 2 tablets by mouth 2 (two) times daily with a meal.       . insulin glargine (LANTUS) 100 UNIT/ML injection Inject into the skin at bedtime.       . lansoprazole (PREVACID) 30 MG capsule Take 30 mg by mouth 2 (two) times daily.       Marland Kitchen lidocaine (LIDODERM) 5 % Place 1 patch onto the skin as needed. Remove & Discard patch within 12 hours or as directed by MD       . metoCLOPramide (REGLAN) 5 MG tablet Take 5 mg by mouth 4 (four) times daily.        Marland Kitchen omega-3 acid ethyl esters (LOVAZA) 1 G capsule Take 4 g by mouth daily.        Marland Kitchen oxyCODONE-acetaminophen  (TYLOX) 5-500 MG per capsule Take 1 capsule by mouth every 4 (four) hours as needed.        . potassium chloride SA (K-DUR,KLOR-CON) 20 MEQ tablet Take 20 mEq by mouth daily.       Marland Kitchen rOPINIRole (REQUIP) 2 MG tablet Take 2 mg by mouth at bedtime.        . saxagliptin HCl (ONGLYZA) 5 MG TABS tablet Take 5 mg by mouth daily.        . traMADol (ULTRAM) 50 MG tablet Take 50 mg by mouth every 6 (six) hours as needed.          No Known Allergies  History   Social History  . Marital Status: Married    Spouse Name: N/A    Number of Children: N/A  . Years of Education: N/A   Occupational History  . TRUCK DRIVER    Social History Main Topics  . Smoking status: Current Everyday Smoker -- 1.0 packs/day  . Smokeless tobacco: Not on file  . Alcohol Use: No  . Drug Use:   . Sexually Active:    Other Topics Concern  . Not on file   Social History Narrative   HEAVY SMOKER AND CONTINUES TO SMOKE 1 PPDDOES  NOT EXERCISE REGULARLY    Family History  Problem Relation Age of Onset  . Heart disease Father   . Diabetes Father   . Cancer Father     PROSTATE  . Arthritis      GRANDMOTHER  . Hypertension      OTHER FAMILY MEMBERS    Review of Systems:  As stated in the HPI and otherwise negative.   BP 160/104  Pulse 100  Ht 6\' 2"  (1.88 m)  Wt 226 lb (102.513 kg)  BMI 29.02 kg/m2  Physical Examination: General: Well developed, well nourished, NAD HEENT: OP clear, mucus membranes moist SKIN: warm, dry. No rashes. Neuro: No focal deficits Musculoskeletal: Muscle strength 5/5 all ext Psychiatric: Mood and affect normal Neck: No JVD, no carotid bruits, no thyromegaly, no lymphadenopathy. Lungs:Clear bilaterally, no wheezes, rhonci, crackles Cardiovascular: Regular rate and rhythm. No murmurs, gallops or rubs. Abdomen:Soft. Bowel sounds present. Non-tender.  Extremities: No lower extremity edema. Pulses are non-palpable in the bilateral DP/PT.  Distal aortogram February 21, 2011:    1.  The bilateral  renal arteries are patent with no evidence of       obstructive disease.   2. The distal aorta is patent without plaque or aneurysm noted.   3. The left common iliac artery has a mild 20% plaque.  The remainder       of the left external iliac artery, internal iliac artery, common       femoral artery is patent with no evidence of disease.  The left       superficial femoral artery has mild plaque in the proximal mid       segment with no obstructive lesions.  The distal SFA just at that       juncture with the popliteal artery has a 90% discrete stenosis.       There is 3-vessel runoff to the left foot, however, the anterior       tibial artery has a proximal 90% stenosis.   4. The right common iliac artery, internal iliac artery, external       iliac artery, and common femoral artery are patent without       obstructive disease.  The right superficial femoral artery has mild       plaque in the proximal mid and distal portion.  The right ATA is       occluded proximally and reconstitutes distally from collateral       vessels.  The right PTA has proximal 70% serial lesions.  The right       peroneal artery has plaque disease.   Atherectomy left popliteal artery July 2012.

## 2011-04-17 NOTE — Assessment & Plan Note (Signed)
Smoking cessation encouraged!

## 2011-04-17 NOTE — Assessment & Plan Note (Signed)
He has severe disease in both legs in the tibial vessels. I cannot offer anything else at this time in regards to percutaneous therapy. He has been referred back to see Dr. Kellie Simmering by primary care for another opinion to see if there are any surgical options. I do not know if there would be any benefit to any surgical procedures. He will follow up with Dr. Kellie Simmering and Dr. Wilson Singer. He will see Dr. Rollene Fare for cardiac issues. I will see him on an as needed basis.

## 2011-04-18 ENCOUNTER — Encounter: Payer: Self-pay | Admitting: Vascular Surgery

## 2011-04-24 ENCOUNTER — Ambulatory Visit (INDEPENDENT_AMBULATORY_CARE_PROVIDER_SITE_OTHER): Payer: BC Managed Care – HMO | Admitting: Vascular Surgery

## 2011-04-24 ENCOUNTER — Encounter: Payer: Self-pay | Admitting: Vascular Surgery

## 2011-04-24 VITALS — BP 192/79 | HR 92 | Resp 20 | Ht 74.0 in | Wt 221.0 lb

## 2011-04-24 DIAGNOSIS — I70219 Atherosclerosis of native arteries of extremities with intermittent claudication, unspecified extremity: Secondary | ICD-10-CM

## 2011-04-24 NOTE — Progress Notes (Signed)
Subjective:     Patient ID: [name], male   DOB: 07-08-1956, 55 y.o.   MRN: [id number]  HPI this patient is status post left carotid endarterectomy by me in March of this year. He also is known to have stable claudication symptoms. He underwent angiography and intervention by Dr. Angelena Form in July of this year. 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 He also had occlusive disease in his right anterior tibial artery with a normal ABI. ABI on the left leg was 0.75. The patient did not feel that the with an intervention on the left leg. He came to me for a second opinion regarding this. He states that his leg symptoms have not changed since the procedure. He has aching discomfort in both calves after walking a half to one block.  Past Medical History  Diagnosis Date  . Carotid artery occlusion 11/10/10    LEFT CAROTID ENDARTERECTOMY  . Diabetes mellitus   . Hypertension   . Hyperlipidemia   . COPD (chronic obstructive pulmonary disease)   . DJD (degenerative joint disease)   . GERD (gastroesophageal reflux disease)   . Substernal chest pain   . 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 Slurred speech     AS PER WIFE IN D/C NOTE 11/10/10  . PAD (peripheral artery disease)     Distal aortogram June 2012. Atherectomy left popliteal artery July 2012.     History  Substance Use Topics  . Smoking status: Current Everyday Smoker -- 0.5 packs/day for 35 years    Types: Cigarettes  . Smokeless tobacco: Never Used  . Alcohol Use: No    Family History  Problem Relation Age of Onset  . Heart disease Father   . Diabetes Father   . Cancer Father     PROSTATE  . Arthritis      GRANDMOTHER  . Hypertension      OTHER FAMILY MEMBERS    No Known Allergies  Current outpatient prescriptions:ALPRAZolam (XANAX) 0.5 MG tablet, Take 0.25 mg by mouth 2 (two) times daily. , Disp: ,  Rfl: ;  amLODipine-valsartan (EXFORGE) 10-320 MG per tablet, Take 1 tablet by mouth daily.  , Disp: , Rfl: ;  aspirin 81 MG EC tablet, Take 81 mg by mouth 2 (two) times daily. , Disp: , Rfl: ;  atorvastatin (LIPITOR) 20 MG tablet, Take 40 mg by mouth daily. , Disp: , Rfl:  carvedilol (COREG) 12.5 MG tablet, Take 25 mg by mouth 2 (two) times daily with a meal. , Disp: , Rfl: ;  Choline Fenofibrate (TRILIPIX) 135 MG capsule, Take 135 mg by mouth daily.  , Disp: , Rfl: ;  ezetimibe (ZETIA) 10 MG tablet, Take 10 mg by mouth daily.  , Disp: , Rfl: ;  FLUoxetine (PROZAC) 20 MG capsule, Take 20 mg by mouth daily.  , Disp: , Rfl: ;  gabapentin (NEURONTIN) 300 MG capsule, Take 300 mg by mouth. 7 per day , Disp: , Rfl:  glyBURIDE-metformin (GLUCOVANCE) 2.5-500 MG per tablet, Take 2 tablets by mouth 2 (two) times daily with a meal. , Disp: , Rfl: ;  insulin glargine (LANTUS) 100 UNIT/ML injection, Inject into the skin at bedtime. , Disp: , Rfl: ;  lansoprazole (PREVACID) 30 MG capsule, Take 30 mg by mouth 2 (two) times daily. , Disp: , Rfl:  lidocaine (LIDODERM) 5 %,  Place 1 patch onto the skin as needed. Remove & Discard patch within 12 hours or as directed by MD , Disp: , Rfl: ;  metoCLOPramide (REGLAN) 5 MG tablet, Take 5 mg by mouth 4 (four) times daily.  , Disp: , Rfl: ;  omega-3 acid ethyl esters (LOVAZA) 1 G capsule, Take 4 g by mouth daily.  , Disp: , Rfl:  oxyCODONE-acetaminophen (TYLOX) 5-500 MG per capsule, Take 1 capsule by mouth every 4 (four) hours as needed.  , Disp: , Rfl: ;  potassium chloride SA (K-DUR,KLOR-CON) 20 MEQ tablet, Take 20 mEq by mouth daily. , Disp: , Rfl: ;  rOPINIRole (REQUIP) 2 MG tablet, Take 2 mg by mouth at bedtime.  , Disp: , Rfl: ;  saxagliptin HCl (ONGLYZA) 5 MG TABS tablet, Take 5 mg by mouth daily.  , Disp: , Rfl:  traMADol (ULTRAM) 50 MG tablet, Take 50 mg by mouth every 6 (six) hours as needed.  , Disp: , Rfl:   Filed Vitals:   04/24/11 1559  Height: 6\' 2"  (1.88 m)  Weight:  221 lb (100.245 kg)    Body mass index is 28.37 kg/(m^2).         Review of Systems  Constitutional: Positive for appetite change.  HENT: Positive for facial swelling and sneezing.   Eyes: Positive for visual disturbance.  Respiratory: Positive for apnea, cough, shortness of breath and wheezing.   Cardiovascular: Positive for leg swelling.  Gastrointestinal: Negative.   Genitourinary: Positive for urgency, frequency and flank pain.  Musculoskeletal: Positive for back pain, joint swelling and arthralgias.  Neurological: Positive for dizziness, weakness, numbness and headaches.  Hematological: Bruises/bleeds easily.  Psychiatric/Behavioral: Negative.        Objective:   Physical Exam Blood pressure 192/79, pulse 92, resp. rate 20, height 6\' 2"  (1.88 m), weight 221 lb (100.245 kg).   he is alert and oriented x3 in no apparent distress. Lower extremity exam reveals 3+ femoral popliteal and dorsalis pedis pulse palpable bilaterally. Both feet are warm and well perfused.  Assessment:     I am unable to visualize his angiogram and intervention studies do to computer malfunction in our office today. However the patient has an excellent dorsalis pedis pulse in the left leg and I did read the intervention which clearly states that the popliteal artery stenosis lesion was one treated. I explained this to the patient and feel that he has had appropriate treatment if he continues to have symptoms it may well be that he has some spinal stenosis.    Plan:     He will return to see me on a regular basis for his carotid occlusive disease. If he develops severe recurrent claudication symptoms we will be happy to evaluate him for that at that time. I reassured him regarding his recent treatment.

## 2011-04-27 ENCOUNTER — Encounter: Payer: Self-pay | Admitting: *Deleted

## 2011-05-01 ENCOUNTER — Ambulatory Visit: Payer: BC Managed Care – HMO | Admitting: Gastroenterology

## 2011-05-04 ENCOUNTER — Encounter: Payer: Self-pay | Admitting: Vascular Surgery

## 2011-05-08 ENCOUNTER — Telehealth: Payer: Self-pay | Admitting: Internal Medicine

## 2011-05-08 ENCOUNTER — Ambulatory Visit (INDEPENDENT_AMBULATORY_CARE_PROVIDER_SITE_OTHER): Payer: BC Managed Care – HMO | Admitting: Internal Medicine

## 2011-05-08 ENCOUNTER — Encounter: Payer: Self-pay | Admitting: Internal Medicine

## 2011-05-08 VITALS — BP 162/82 | HR 90 | Ht 74.0 in | Wt 227.0 lb

## 2011-05-08 DIAGNOSIS — K219 Gastro-esophageal reflux disease without esophagitis: Secondary | ICD-10-CM | POA: Insufficient documentation

## 2011-05-08 DIAGNOSIS — K2270 Barrett's esophagus without dysplasia: Secondary | ICD-10-CM | POA: Insufficient documentation

## 2011-05-08 MED ORDER — LANSOPRAZOLE 30 MG PO CPDR: 30.0000 mg | DELAYED_RELEASE_CAPSULE | Freq: Two times a day (BID) | ORAL | Status: DC

## 2011-05-08 NOTE — Patient Instructions (Signed)
You have been scheduled for an Endoscopy. Instructions have been provided. Your Prevacid has been sent to your pharmacy.

## 2011-05-08 NOTE — Telephone Encounter (Signed)
Forwarded to Dr. Pyrtle for review.  °

## 2011-05-08 NOTE — Progress Notes (Signed)
Subjective:    Patient ID: [name], male    DOB: October 10, 1955, 55 y.o.   MRN: [id number]  HPI [name] is a 55 year old male with a past medical history of hypertension, tobacco abuse, peripheral vascular disease, GERD with history of Barrett's esophagus who presents for evaluation of heartburn and history of Barrett's esophagus at the request of Dr. Wilson Singer. He is alone today.  The patient reports overall today he is doing pretty well. He reports his heartburn symptoms are well-controlled on lansoprazole 30 mg twice a day. He reports significant heartburn and chest discomfort if he misses a dose of this medication. He rarely has breakthrough symptoms. He does occasionally report nausea, and this seems to be worse after taking his medications. He rarely has vomiting. She's had no hematemesis. He denies dysphagia or odynophagia. His bowel movements have been regular for him though he notes that they are usually loose, and occur 3-4 times daily. This is a chronic issue for him. He denies bright red blood per rectum. Has had no melena of late, but he reports a past history of black stool.  He denies abdominal pain.  No fevers chills or night sweats.   Review of Systems Constitutional: Negative for fever, chills, night sweats, activity change, appetite change and unexpected weight change HEENT: Negative for sore throat, mouth sores and trouble swallowing. Eyes: Negative for visual disturbance Respiratory: Negative for cough, chest tightness and shortness of breath Cardiovascular: Negative for chest pain, palpitations and lower extremity swelling Gastrointestinal: See history of present illness Genitourinary: Negative for dysuria and hematuria. Musculoskeletal: Chronic back and LE pain, no myalgias Skin: Negative for rash or color change Neurological: Negative for headaches, weakness, numbness Hematological: Negative for adenopathy, negative for easy bruising/bleeding Psychiatric/behavioral:  Negative for depressed mood, negative for anxiety  Patient Active Problem List  Diagnoses  . PVD (peripheral vascular disease)  . HTN (hypertension)  . Tobacco abuse  GERD with Barrett's (2006)  Current outpatient prescriptions:ALPRAZolam (XANAX) 0.5 MG tablet, Take 0.25 mg by mouth 2 (two) times daily. , Disp: , Rfl: ;  amLODipine-valsartan (EXFORGE) 10-320 MG per tablet, Take 1 tablet by mouth daily.  , Disp: , Rfl: ;  aspirin 81 MG EC tablet, Take 81 mg by mouth 2 (two) times daily. , Disp: , Rfl: ;  atorvastatin (LIPITOR) 20 MG tablet, Take 40 mg by mouth daily. , Disp: , Rfl:  carvedilol (COREG) 12.5 MG tablet, Take 25 mg by mouth 2 (two) times daily with a meal. , Disp: , Rfl: ;  Choline Fenofibrate (TRILIPIX) 135 MG capsule, Take 135 mg by mouth daily.  , Disp: , Rfl: ;  ezetimibe (ZETIA) 10 MG tablet, Take 10 mg by mouth daily.  , Disp: , Rfl: ;  FLUoxetine (PROZAC) 20 MG capsule, Take 20 mg by mouth daily.  , Disp: , Rfl: ;  gabapentin (NEURONTIN) 300 MG capsule, Take 300 mg by mouth. 7 per day , Disp: , Rfl:  glyBURIDE-metformin (GLUCOVANCE) 2.5-500 MG per tablet, Take 2 tablets by mouth 2 (two) times daily with a meal. , Disp: , Rfl: ;  insulin glargine (LANTUS) 100 UNIT/ML injection, Inject into the skin at bedtime. , Disp: , Rfl: ;  lansoprazole (PREVACID) 30 MG capsule, Take 30 mg by mouth 2 (two) times daily. , Disp: , Rfl:  lidocaine (LIDODERM) 5 %, Place 1 patch onto the skin as needed. Remove & Discard patch within 12 hours or as directed by MD , Disp: , Rfl: ;  metoCLOPramide (  REGLAN) 5 MG tablet, Take 5 mg by mouth 4 (four) times daily.  , Disp: , Rfl: ;  omega-3 acid ethyl esters (LOVAZA) 1 G capsule, Take 4 g by mouth daily.  , Disp: , Rfl:  oxyCODONE-acetaminophen (TYLOX) 5-500 MG per capsule, Take 1 capsule by mouth every 4 (four) hours as needed.  , Disp: , Rfl: ;  potassium chloride SA (K-DUR,KLOR-CON) 20 MEQ tablet, Take 20 mEq by mouth daily. , Disp: , Rfl: ;  rOPINIRole  (REQUIP) 2 MG tablet, Take 2 mg by mouth at bedtime.  , Disp: , Rfl: ;  saxagliptin HCl (ONGLYZA) 5 MG TABS tablet, Take 5 mg by mouth daily.  , Disp: , Rfl:  traMADol (ULTRAM) 50 MG tablet, Take 50 mg by mouth every 6 (six) hours as needed.  , Disp: , Rfl: ;  lansoprazole (PREVACID) 30 MG capsule, Take 1 capsule (30 mg total) by mouth 2 (two) times daily., Disp: 30 capsule, Rfl: 11  No Known Allergies  Family History  Problem Relation Age of Onset  . Heart disease Father   . Diabetes Father   . Cancer Father     PROSTATE  . Arthritis      GRANDMOTHER  . Hypertension      OTHER FAMILY MEMBERS  -Neg for CRC  Social History  . Marital Status: Married   Occupational History  . TRUCK DRIVER    Social History Main Topics  . Smoking status: Current Everyday Smoker -- 0.5 packs/day for 35 years, now approx 1 pk/day    Types: Cigarettes  . Alcohol Use: None now, social prior (last beer 6 mon ago)  . Drug Use: No      Objective:   Physical Exam BP 162/82  Pulse 90  Ht 6\' 2"  (1.88 m)  Wt 227 lb (102.967 kg)  BMI 29.15 kg/m2 Constitutional: Well-developed and well-nourished. No distress. HEENT: Normocephalic and atraumatic. Oropharynx is clear and moist. No oropharyngeal exudate. Conjunctivae are normal. Pupils are equal round and reactive to light. No scleral icterus.  Tobacco stained mustache  Neck: Neck supple. Trachea midline. Cardiovascular: Normal rate, regular rhythm and intact distal pulses. No M/R/G Pulmonary/chest: Effort normal and breath sounds normal. No wheezing, rales or rhonchi. Abdominal: Soft, nontender, nondistended. There are no masses palpable. No hepatosplenomegaly. Lymphadenopathy: No cervical adenopathy noted. Neurological: Alert and oriented to person place and time. Skin: Skin is warm and dry. No rashes noted.  Psychiatric: Normal mood and affect. Behavior is normal.  Prior endoscopy/colonoscopy 01/25/2005: EGD revealed Barrett's esophagus with no  dysplasia 11/06/2006: EGD with possible Barrett's islands, pathology no metaplasia or dysplasia 08/10/2008: EGD with possible Barrett's islands, pathology no metaplasia or dysplasia  01/25/2005: Colonoscopy hyperplastic rectal polyp and fragments of mucosal prolapse    Assessment & Plan:  55 year old male with a past medical history of hypertension, tobacco abuse, peripheral vascular disease, GERD with history of Barrett's esophagus who presents for evaluation of heartburn and history of Barrett's esophagus at the request of Dr. Wilson Singer  1. GERD/Barrett's -- the patient's heartburn symptoms at present are well controlled on twice a day PPI.  He will continue on lansoprazole 30 mg twice a day and he has been given a prescription for this today with 11 refills.  He did have Barrett's esophagus in 2006 with dysplasia but on 2 subsequent EGDs in 2008 and 2009 no metaplasia was seen.  Given that it has been 3 years since the last surveillance we will repeat the EGD with plans for  biopsies.  If no Barrett's metaplasia is seen on this exam then followup will likely be necessary in 5 years (unless future guidelines indicate surveillance can be discontinued).  2. ? Gastroparesis -- on one of his prior EGDs there was food in the stomach suggesting gastroparesis. He is having some intermittent nausea and has risk factors for gastroparesis and his diabetes.  He is taking Reglan 5 mg 4 times a day and has been for 3-4 years. We discussed the risk of this medication long-term including tardive dyskinesia.  For now the patient wishes to continue on this medication. Tight glucose control and dietary modifications are recommended.  3.  CRC screening -- patient had distal hyperplastic polyp only on his initial screening exam. He is due for repeat in 2016.

## 2011-05-31 ENCOUNTER — Encounter: Payer: Self-pay | Admitting: Internal Medicine

## 2011-05-31 ENCOUNTER — Ambulatory Visit (AMBULATORY_SURGERY_CENTER): Payer: BC Managed Care – HMO | Admitting: Internal Medicine

## 2011-05-31 VITALS — BP 194/100 | HR 86 | Temp 97.3°F | Resp 19 | Ht 74.0 in | Wt 227.0 lb

## 2011-05-31 DIAGNOSIS — K219 Gastro-esophageal reflux disease without esophagitis: Secondary | ICD-10-CM

## 2011-05-31 DIAGNOSIS — K2270 Barrett's esophagus without dysplasia: Secondary | ICD-10-CM

## 2011-05-31 LAB — URINALYSIS, ROUTINE W REFLEX MICROSCOPIC
Bilirubin Urine: NEGATIVE
Glucose, UA: 500 — AB
Hgb urine dipstick: NEGATIVE
Ketones, ur: NEGATIVE
Nitrite: NEGATIVE
Protein, ur: NEGATIVE
Specific Gravity, Urine: 1.009
Urobilinogen, UA: 0.2
pH: 6.5

## 2011-05-31 LAB — DIFFERENTIAL
Basophils Absolute: 0
Basophils Relative: 0
Eosinophils Absolute: 0.2
Eosinophils Relative: 3
Lymphocytes Relative: 24
Lymphs Abs: 1.7
Monocytes Absolute: 0.5
Monocytes Relative: 6
Neutro Abs: 4.8
Neutrophils Relative %: 67

## 2011-05-31 LAB — CBC
HCT: 41.1
Hemoglobin: 14.8
MCHC: 36
MCV: 85.2
Platelets: 193
RBC: 4.82
RDW: 12.6
WBC: 7.2

## 2011-05-31 LAB — COMPREHENSIVE METABOLIC PANEL
ALT: 19
AST: 16
Albumin: 3.8
Alkaline Phosphatase: 130 — ABNORMAL HIGH
BUN: 6
CO2: 28
Calcium: 9.5
Chloride: 104
Creatinine, Ser: 0.61
GFR calc Af Amer: >60
GFR calc non Af Amer: >60
Glucose, Bld: 165 — ABNORMAL HIGH
Potassium: 3.8
Sodium: 138
Total Bilirubin: 0.7
Total Protein: 6.1

## 2011-05-31 MED ORDER — SODIUM CHLORIDE 0.9 % IV SOLN: 500.0000 mL | INTRAVENOUS | Status: DC

## 2011-05-31 NOTE — Progress Notes (Signed)
ptsa initial BP 199/109. Pt states this is normal for him. He is on BP medicine but did not take it this am. Recheck BP 208/99. Continued to register high with changing cuffs, repositioning to lower left arm. MD made aware with no new orders. EWM

## 2011-05-31 NOTE — Patient Instructions (Signed)
Please review discharge instructions (blue and green sheets)  Await pathology  Your blood pressure was significantly elevated today.  Dr. Hilarie Fredrickson recommends that you resume your blood pressure medications immediately If your pressure remains high contact your primary care doctor

## 2011-06-01 ENCOUNTER — Telehealth: Payer: Self-pay

## 2011-06-01 LAB — GLUCOSE, CAPILLARY
Glucose-Capillary: 274 mg/dL — ABNORMAL HIGH (ref 70–99)
Glucose-Capillary: 270 mg/dL — ABNORMAL HIGH (ref 70–99)

## 2011-06-01 NOTE — Telephone Encounter (Signed)
Follow up Call- Patient questions:  Do you have a fever, pain , or abdominal swelling? no Pain Score  0 *  Have you tolerated food without any problems? yes  Have you been able to return to your normal activities? yes  Do you have any questions about your discharge instructions: Diet   no Medications  no Follow up visit  no  Do you have questions or concerns about your Care? no  Actions: * If pain score is 4 or above: No action needed, pain <4.   

## 2011-06-08 ENCOUNTER — Telehealth: Payer: Self-pay | Admitting: *Deleted

## 2011-06-08 NOTE — Telephone Encounter (Signed)
Informed pt's wife the latest path showed no Barrett's and there has been no evidence either visually or per pathology on last 3 EGD's, only on the 1st. Given that, no further surveillance endoscopy is needed; no repeat EGD will be done unless symptoms warrant. Dr Hilarie Fredrickson will see him in one year unless he has problems; wife stated understanding.

## 2011-06-08 NOTE — Telephone Encounter (Signed)
Message copied by Lance Morin on Fri Jun 08, 2011  8:39 AM ------      Message from: Jerene Bears      Created: Thu Jun 07, 2011  3:39 PM       Please let patient know the results of his recent GE junction biopsy.      No Barrett's esophagus was found. Over his last 3 EGDs... there has been no Barrett's      He did have Barrett's without dysplasia on his first EGD ever.      Given that over multiple years and 3 procedures no Barrett's was seen with the eye or seen on pathology, no further surveillance endoscopy is warranted.      Therefore he will not need repeat EGD unless symptoms warrant      We do want to ensure that his heartburn remains controlled with PPI      I will see him back in one year or sooner if needed.

## 2011-06-11 ENCOUNTER — Encounter: Payer: Self-pay | Admitting: Vascular Surgery

## 2011-06-12 ENCOUNTER — Encounter: Payer: Self-pay | Admitting: Vascular Surgery

## 2011-06-12 ENCOUNTER — Other Ambulatory Visit (INDEPENDENT_AMBULATORY_CARE_PROVIDER_SITE_OTHER): Payer: BC Managed Care – HMO | Admitting: Vascular Surgery

## 2011-06-12 ENCOUNTER — Ambulatory Visit (INDEPENDENT_AMBULATORY_CARE_PROVIDER_SITE_OTHER): Payer: BC Managed Care – HMO | Admitting: Vascular Surgery

## 2011-06-12 VITALS — BP 175/88 | HR 110 | Resp 20 | Ht 74.0 in | Wt 219.0 lb

## 2011-06-12 DIAGNOSIS — I70219 Atherosclerosis of native arteries of extremities with intermittent claudication, unspecified extremity: Secondary | ICD-10-CM

## 2011-06-12 DIAGNOSIS — Z48812 Encounter for surgical aftercare following surgery on the circulatory system: Secondary | ICD-10-CM | POA: Insufficient documentation

## 2011-06-12 DIAGNOSIS — I6529 Occlusion and stenosis of unspecified carotid artery: Secondary | ICD-10-CM | POA: Insufficient documentation

## 2011-06-12 NOTE — Progress Notes (Signed)
Addended by: Alinda Sierras A on: 06/12/2011 03:48 PM   Modules accepted: Orders

## 2011-06-12 NOTE — Progress Notes (Signed)
Subjective:     Patient ID: [name], male   DOB: June 06, 1956, 55 y.o.   MRN: [id number]  HPI this 55 year old male returns today for followup regarding his carotid occlusive disease. He had a left carotid endarterectomy performed by me in March of 2012. He had a subtotal occlusion of his left internal carotid artery had been suffering left brain TIAs. Some of these were visual symptoms. Following his successful surgery he's had no recurrent neurologic symptoms such as lateralizing weakness, amaurosis fugax, aphasia diplopia, blurred vision, or syncope he continues to have stable calf claudication symptoms appear he has been treated by Dr. Julianne Handler with stenting of his left popliteal artery.   Past Medical History  Diagnosis Date  . Carotid artery occlusion 11/10/10    LEFT CAROTID ENDARTERECTOMY  . Diabetes mellitus   . Hypertension   . Hyperlipidemia   . COPD (chronic obstructive pulmonary disease)   . DJD (degenerative joint disease)   . GERD (gastroesophageal reflux disease)   . Substernal chest pain   . 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 Slurred speech     AS PER WIFE IN D/C NOTE 11/10/10  . PAD (peripheral artery disease)     Distal aortogram June 2012. Atherectomy left popliteal artery July 2012.   . Barrett's esophagus   . Fatty liver   . Diverticulosis of colon (without mention of hemorrhage)   . Sleep apnea   . Emphysema of lung     History  Substance Use Topics  . Smoking status: Current Everyday Smoker -- 0.5 packs/day for 35 years    Types: Cigarettes  . Smokeless tobacco: Never Used  . Alcohol Use: Yes     occasionally    Family History  Problem Relation Age of Onset  . Heart disease Father   . Diabetes Father   . Cancer Father     PROSTATE  . Arthritis      GRANDMOTHER  . Hypertension      OTHER FAMILY MEMBERS  . Colon cancer Brother     Not on File  Current outpatient prescriptions:ALPRAZolam (XANAX) 0.5 MG tablet, Take  0.25 mg by mouth 2 (two) times daily. , Disp: , Rfl: ;  amLODipine-valsartan (EXFORGE) 10-320 MG per tablet, Take 1 tablet by mouth daily.  , Disp: , Rfl: ;  aspirin 81 MG EC tablet, Take 81 mg by mouth 2 (two) times daily. , Disp: , Rfl: ;  atorvastatin (LIPITOR) 20 MG tablet, Take 40 mg by mouth daily. , Disp: , Rfl:  carvedilol (COREG) 12.5 MG tablet, Take 25 mg by mouth 2 (two) times daily with a meal. , Disp: , Rfl: ;  Choline Fenofibrate (TRILIPIX) 135 MG capsule, Take 135 mg by mouth daily.  , Disp: , Rfl: ;  ezetimibe (ZETIA) 10 MG tablet, Take 10 mg by mouth daily.  , Disp: , Rfl: ;  FLUoxetine (PROZAC) 20 MG capsule, Take 20 mg by mouth daily.  , Disp: , Rfl: ;  gabapentin (NEURONTIN) 300 MG capsule, Take 300 mg by mouth. 7 per day , Disp: , Rfl:  glyBURIDE-metformin (GLUCOVANCE) 2.5-500 MG per tablet, Take 2 tablets by mouth 2 (two) times daily with a meal. , Disp: , Rfl: ;  insulin glargine (LANTUS) 100 UNIT/ML injection, Inject into the skin at bedtime. , Disp: , Rfl: ;  lansoprazole (PREVACID) 30 MG capsule, Take 1 capsule (30 mg total) by mouth 2 (two) times daily., Disp: 30  capsule, Rfl: 11 lidocaine (LIDODERM) 5 %, Place 1 patch onto the skin as needed. Remove & Discard patch within 12 hours or as directed by MD , Disp: , Rfl: ;  metoCLOPramide (REGLAN) 5 MG tablet, Take 5 mg by mouth 4 (four) times daily.  , Disp: , Rfl: ;  omega-3 acid ethyl esters (LOVAZA) 1 G capsule, Take 4 g by mouth daily.  , Disp: , Rfl:  oxyCODONE-acetaminophen (TYLOX) 5-500 MG per capsule, Take 1 capsule by mouth every 4 (four) hours as needed.  , Disp: , Rfl: ;  potassium chloride SA (K-DUR,KLOR-CON) 20 MEQ tablet, Take 20 mEq by mouth daily. , Disp: , Rfl: ;  rOPINIRole (REQUIP) 2 MG tablet, Take 2 mg by mouth at bedtime.  , Disp: , Rfl: ;  saxagliptin HCl (ONGLYZA) 5 MG TABS tablet, Take 5 mg by mouth daily.  , Disp: , Rfl:  traMADol (ULTRAM) 50 MG tablet, Take 50 mg by mouth every 6 (six) hours as needed.  , Disp:  , Rfl:   BP 175/88  Pulse 110  Resp 20  Ht 6\' 2"  (1.88 m)  Wt 219 lb (99.338 kg)  BMI 28.12 kg/m2  Body mass index is 28.12 kg/(m^2).        Review of Systems he complains of weight loss, anorexia, dizziness, headaches, arthritis, joint pain, muscle pain, occasional dyspnea on exertion, chest pain, reflux esophagitis, diarrhea, and urinary frequency. All other systems are negative complete review of systems     Objective:   Physical Exam blood pressure 175/88 heart rate 110 respirations 20 General he is a well-developed well-nourished male in no apparent distress HEENT normal for age Lungs no rhonchi or wheezing Cardiovascular regular rhythm no murmurs carotid pulses 3+ no bruits Neurologic exam normal Abdomen soft nontender History plus femoral popliteal and dorsalis pedis pulses palpable bilaterall today I ordered a carotid duplex exam which are reviewed and interpreted. There is no evidence of restenosis of the left carotid endarterectomy site. His right internal carotid is widely patent. He has some narrowing his right external carotid-60%     Assessment:    doing well post left carotid endarterectomy for subtotal occlusion with left brain TIA    Plan:     Return 6 months for carotid duplex exam.

## 2011-06-12 NOTE — Progress Notes (Unsigned)
Carotid 06/12/2011 performed [facility]

## 2011-06-14 LAB — GLUCOSE, CAPILLARY: Glucose-Capillary: 278 mg/dL — ABNORMAL HIGH (ref 70–99)

## 2011-06-18 NOTE — Procedures (Unsigned)
CAROTID DUPLEX EXAM  INDICATION:  Follow up carotid stenosis.  HISTORY: Diabetes:  Yes. Cardiac:  CABG, CAD. Hypertension:  Yes. Smoking:  Currently. Previous Surgery:  Left carotid endarterectomy on 11/10/2010. CV History:  Asymptomatic. Amaurosis Fugax No, Paresthesias No, Hemiparesis No.                                      RIGHT             LEFT Brachial systolic pressure:         182               178 Brachial Doppler waveforms:         WNL               WNL Vertebral direction of flow:        Antegrade         Antegrade DUPLEX VELOCITIES (cm/sec) CCA peak systolic                   132               [id number] ECA peak systolic                   241               [id number] ICA peak systolic                   78                76 ICA end diastolic                   23                10 PLAQUE MORPHOLOGY:                  Heterogenous      Heterogenous PLAQUE AMOUNT:                      Mild              Mild PLAQUE LOCATION:                    CCA/ICA/ECA       CCA  IMPRESSION: 1. Right internal carotid artery stenosis in the 1% to 39% range. 2. Right external carotid artery stenosis present. 3. Left common carotid artery disease present. 4. Left internal carotid artery is patent with a history of     endarterectomy. 5. Bilateral vertebral arteries are patent and antegrade. 6. Right side is essentially unchanged, and left is improved since     study done on 11/06/2010.  ___________________________________________ Nelda Severe. Kellie Simmering, M.D.  SH/MEDQ  D:  06/12/2011  T:  06/12/2011  Job:  [id number]

## 2011-10-31 ENCOUNTER — Emergency Department (HOSPITAL_COMMUNITY): Payer: BC Managed Care – HMO

## 2011-10-31 ENCOUNTER — Encounter (HOSPITAL_COMMUNITY): Payer: Self-pay | Admitting: Emergency Medicine

## 2011-10-31 DIAGNOSIS — E871 Hypo-osmolality and hyponatremia: Secondary | ICD-10-CM | POA: Diagnosis present

## 2011-10-31 DIAGNOSIS — E1169 Type 2 diabetes mellitus with other specified complication: Principal | ICD-10-CM | POA: Diagnosis present

## 2011-10-31 DIAGNOSIS — L02619 Cutaneous abscess of unspecified foot: Secondary | ICD-10-CM | POA: Diagnosis present

## 2011-10-31 DIAGNOSIS — J449 Chronic obstructive pulmonary disease, unspecified: Secondary | ICD-10-CM | POA: Diagnosis present

## 2011-10-31 DIAGNOSIS — Z23 Encounter for immunization: Secondary | ICD-10-CM

## 2011-10-31 DIAGNOSIS — I10 Essential (primary) hypertension: Secondary | ICD-10-CM | POA: Diagnosis present

## 2011-10-31 DIAGNOSIS — E118 Type 2 diabetes mellitus with unspecified complications: Secondary | ICD-10-CM

## 2011-10-31 DIAGNOSIS — L97509 Non-pressure chronic ulcer of other part of unspecified foot with unspecified severity: Secondary | ICD-10-CM | POA: Diagnosis present

## 2011-10-31 DIAGNOSIS — E876 Hypokalemia: Secondary | ICD-10-CM | POA: Diagnosis present

## 2011-10-31 DIAGNOSIS — L03039 Cellulitis of unspecified toe: Secondary | ICD-10-CM | POA: Diagnosis present

## 2011-10-31 DIAGNOSIS — L0390 Cellulitis, unspecified: Secondary | ICD-10-CM | POA: Diagnosis present

## 2011-10-31 DIAGNOSIS — Z794 Long term (current) use of insulin: Secondary | ICD-10-CM

## 2011-10-31 DIAGNOSIS — IMO0001 Reserved for inherently not codable concepts without codable children: Secondary | ICD-10-CM | POA: Diagnosis present

## 2011-10-31 DIAGNOSIS — E1165 Type 2 diabetes mellitus with hyperglycemia: Principal | ICD-10-CM | POA: Diagnosis present

## 2011-10-31 DIAGNOSIS — M869 Osteomyelitis, unspecified: Secondary | ICD-10-CM | POA: Diagnosis present

## 2011-10-31 DIAGNOSIS — F17200 Nicotine dependence, unspecified, uncomplicated: Secondary | ICD-10-CM | POA: Diagnosis present

## 2011-10-31 DIAGNOSIS — M609 Myositis, unspecified: Secondary | ICD-10-CM | POA: Diagnosis present

## 2011-10-31 DIAGNOSIS — IMO0002 Reserved for concepts with insufficient information to code with codable children: Principal | ICD-10-CM | POA: Diagnosis present

## 2011-10-31 DIAGNOSIS — I739 Peripheral vascular disease, unspecified: Secondary | ICD-10-CM | POA: Diagnosis present

## 2011-10-31 DIAGNOSIS — J4489 Other specified chronic obstructive pulmonary disease: Secondary | ICD-10-CM | POA: Diagnosis present

## 2011-10-31 DIAGNOSIS — Z96659 Presence of unspecified artificial knee joint: Secondary | ICD-10-CM

## 2011-10-31 LAB — BASIC METABOLIC PANEL
BUN: 6 mg/dL (ref 6–23)
CO2: 25 mEq/L (ref 19–32)
Calcium: 9.6 mg/dL (ref 8.4–10.5)
Chloride: 93 mEq/L — ABNORMAL LOW (ref 96–112)
Creatinine, Ser: 0.57 mg/dL (ref 0.50–1.35)
GFR calc Af Amer: >90 mL/min (ref >90)
GFR calc non Af Amer: >90 mL/min (ref >90)
Glucose, Bld: 369 mg/dL — ABNORMAL HIGH (ref 70–99)
Potassium: 3.4 mEq/L — ABNORMAL LOW (ref 3.5–5.1)
Sodium: 130 mEq/L — ABNORMAL LOW (ref 135–145)

## 2011-10-31 LAB — CBC
HCT: 38.5 % — ABNORMAL LOW (ref 39.0–52.0)
Hemoglobin: 14.5 g/dL (ref 13.0–17.0)
MCH: 30.8 pg (ref 26.0–34.0)
MCHC: 37.7 g/dL — ABNORMAL HIGH (ref 30.0–36.0)
MCV: 81.7 fL (ref 78.0–100.0)
Platelets: 173 10*3/uL (ref 150–400)
RBC: 4.71 MIL/uL (ref 4.22–5.81)
RDW: 12.4 % (ref 11.5–15.5)
WBC: 9.4 10*3/uL (ref 4.0–10.5)

## 2011-10-31 LAB — GLUCOSE, CAPILLARY: Glucose-Capillary: 318 mg/dL — ABNORMAL HIGH (ref 70–99)

## 2011-10-31 MED ORDER — ONDANSETRON HCL 4 MG/2ML IJ SOLN
4.0000 mg | Freq: Once | INTRAMUSCULAR | Status: AC
  Administered 2011-10-31: 4 mg via INTRAVENOUS
  Filled 2011-10-31: qty 2

## 2011-10-31 MED ORDER — SODIUM CHLORIDE 0.9 % IV BOLUS (SEPSIS)
1000.0000 mL | Freq: Once | INTRAVENOUS | Status: AC
  Administered 2011-10-31: 1000 mL via INTRAVENOUS

## 2011-10-31 MED ORDER — VANCOMYCIN HCL IN DEXTROSE 1-5 GM/200ML-% IV SOLN
1000.0000 mg | Freq: Once | INTRAVENOUS | Status: AC
  Administered 2011-10-31: 1000 mg via INTRAVENOUS
  Filled 2011-10-31: qty 200

## 2011-10-31 MED ORDER — PIPERACILLIN-TAZOBACTAM 3.375 G IVPB
3.3750 g | Freq: Once | INTRAVENOUS | Status: AC
  Administered 2011-10-31: 3.375 g via INTRAVENOUS
  Filled 2011-10-31: qty 50

## 2011-10-31 MED ORDER — MORPHINE SULFATE 4 MG/ML IJ SOLN
4.0000 mg | Freq: Once | INTRAMUSCULAR | Status: AC
  Administered 2011-10-31: 4 mg via INTRAVENOUS
  Filled 2011-10-31: qty 1

## 2011-10-31 NOTE — ED Notes (Signed)
Pt c/o pain in right leg with swelling and ulceration to 4th digit on right foot; pt sts on antibiotics x several weeks but not improving

## 2011-10-31 NOTE — ED Provider Notes (Signed)
The patient had a prolonged infection. His right fourth toe. He states that for the last 2 weeks. It has been worsening despite taking both doxycycline and Keflex. His sugars have been running higher than usual. He has diffuse swelling in the right lower leg. The right fourth toe has an ulcer laterally and the toe itself is moderately swollen and somewhat tender. Perfusion to all the toes appear good. There is edema of the foot and right lower leg.  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 He'll need to be admitted for parenteral treatments and observation.  Medical screening examination/treatment/procedure(s) were conducted as a shared visit with non-physician practitioner(s) and myself.  I personally evaluated the patient during the encounter  Richarda Blade, MD 10/31/11 2253

## 2011-10-31 NOTE — ED Provider Notes (Signed)
History     CSN: [id number]  Arrival date & time 10/31/11  [id number]   First MD Initiated Contact with Patient 10/31/11 2157      Chief Complaint  Patient presents with  . Foot Pain  . Leg Pain    (Consider location/radiation/quality/duration/timing/severity/associated sxs/prior treatment) HPI  Patient presents to the emergency department with complaints of right leg swelling and an ulcer on the fourth toe. of his right foot. The patient has been on Keflex for one month but his infection appears to be spreading. He denies symptoms of weakness, nausea, vomiting, fevers. At this time in triage she is afebrile, as not tachycardic and states that his sugars have been in the 300's.  Past Medical History  Diagnosis Date  . Carotid artery occlusion 11/10/10    LEFT CAROTID ENDARTERECTOMY  . Diabetes mellitus   . Hypertension   . Hyperlipidemia   . COPD (chronic obstructive pulmonary disease)   . DJD (degenerative joint disease)   . GERD (gastroesophageal reflux disease)   . Substernal chest pain   . 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 Slurred speech     AS PER WIFE IN D/C NOTE 11/10/10  . PAD (peripheral artery disease)     Distal aortogram June 2012. Atherectomy left popliteal artery July 2012.   . Barrett's esophagus   . Fatty liver   . Diverticulosis of colon (without mention of hemorrhage)   . Sleep apnea   . Emphysema of lung     Past Surgical History  Procedure Date  . Laminectomy     X 3 LUMBAR AND X 2 CERVICAL SPINE OPERATIONS  . Carotid endarterectomy 11/10/10  . Hernia repair     LEFT INGUINAL AND UMBILICAL REPAIRS  . Anterior fusion cervical spine 02/06/06    C4-5, C5-6, C6-7; SURGEON DR. MAX COHEN  . Penile prosthesis implant 08/14/05    INFRAPUBIC INSERTION OF INFLATABLE PENILE PROSTHESIS; SURGEON DR. Amalia Hailey  . Cardiac catheterization 10/31/04  . Laparoscopic cholecystectomy w/ cholangiography 11/09/04    SURGEON DR. Luella Cook  . Total knee  arthroplasty 07/2002    RIGHT KNEE ; SURGEON  DR. [name] ALSO HAD ARTHROSCOPIC RIGHT KNEE IN  10/2001  . Shoulder arthroscopy   . Cholecystectomy     Family History  Problem Relation Age of Onset  . Heart disease Father   . Diabetes Father   . Cancer Father     PROSTATE  . Arthritis      GRANDMOTHER  . Hypertension      OTHER FAMILY MEMBERS  . Colon cancer Brother     History  Substance Use Topics  . Smoking status: Current Everyday Smoker -- 0.5 packs/day for 35 years    Types: Cigarettes  . Smokeless tobacco: Never Used  . Alcohol Use: Yes     occasionally      Review of Systems  All other systems reviewed and are negative.    Allergies  Adhesive  Home Medications   Current Outpatient Rx  Name Route Sig Dispense Refill  . ALPRAZOLAM 0.5 MG PO TABS Oral Take 0.25 mg by mouth 2 (two) times daily.     Marland Kitchen AMLODIPINE BESYLATE-VALSARTAN 10-320 MG PO TABS Oral Take 1 tablet by mouth daily.      . ASPIRIN 81 MG PO TBEC Oral Take 81 mg by mouth 2 (two) times daily.     Marland Kitchen CARVEDILOL 12.5 MG PO TABS Oral Take 25 mg  by mouth 2 (two) times daily with a meal.     . CEPHALEXIN 500 MG PO CAPS Oral Take 500 mg by mouth 4 (four) times daily.    . CHOLINE FENOFIBRATE 135 MG PO CPDR Oral Take 135 mg by mouth daily.      Marland Kitchen DOXYCYCLINE MONOHYDRATE 100 MG PO CAPS Oral Take 100 mg by mouth 2 (two) times daily.    Marland Kitchen FLUOXETINE HCL 20 MG PO CAPS Oral Take 20 mg by mouth daily.     . GLYBURIDE-METFORMIN 2.5-500 MG PO TABS Oral Take 2 tablets by mouth 2 (two) times daily with a meal.     . INSULIN GLARGINE 100 UNIT/ML [place] SOLN Subcutaneous Inject 50 Units into the skin 2 (two) times daily.     Marland Kitchen LANSOPRAZOLE 30 MG PO CPDR Oral Take 30 mg by mouth 2 (two) times daily.    Marland Kitchen LIDOCAINE 5 % EX PTCH Transdermal Place 1 patch onto the skin as needed. Remove & Discard patch within 12 hours or as directed by MD     . METOCLOPRAMIDE HCL 5 MG PO TABS Oral Take 5 mg by mouth 4 (four) times daily.       . OMEGA-3-ACID ETHYL ESTERS 1 G PO CAPS Oral Take 4 g by mouth daily.     . OXYCODONE-ACETAMINOPHEN 5-500 MG PO CAPS Oral Take 1 capsule by mouth every 4 (four) hours as needed. For pain    . POTASSIUM CHLORIDE CRYS ER 20 MEQ PO TBCR Oral Take 20 mEq by mouth daily.     Marland Kitchen PREGABALIN 75 MG PO CAPS Oral Take 75 mg by mouth 2 (two) times daily.    Marland Kitchen ROPINIROLE HCL 2 MG PO TABS Oral Take 2 mg by mouth at bedtime.      . TRAMADOL HCL 50 MG PO TABS Oral Take 50 mg by mouth every 6 (six) hours as needed. For pain      BP 194/82  Pulse 88  Temp(Src) 98 F (36.7 C) (Oral)  Resp 18  SpO2 97%  Physical Exam  Nursing note and vitals reviewed. Constitutional: He is oriented to person, place, and time. He appears well-developed and well-nourished.  HENT:  Head: Normocephalic and atraumatic.  Eyes: Pupils are equal, round, and reactive to light.  Neck: Normal range of motion.  Cardiovascular: Normal rate and regular rhythm.   Pulmonary/Chest: Effort normal.  Musculoskeletal:       Right ankle: He exhibits decreased range of motion and swelling. He exhibits no ecchymosis, no deformity, no laceration and normal pulse.       Feet:  Neurological: He is alert and oriented to person, place, and time.  Skin: Skin is warm and dry.    ED Course  Procedures (including critical care time)  Labs Reviewed  GLUCOSE, CAPILLARY - Abnormal; Notable for the following:    Glucose-Capillary 318 (*)    All other components within normal limits  BASIC METABOLIC PANEL - Abnormal; Notable for the following:    Sodium 130 (*)    Potassium 3.4 (*)    Chloride 93 (*)    Glucose, Bld 369 (*)    All other components within normal limits  CBC   Dg Foot Complete Right  10/31/2011  *RADIOLOGY REPORT*  Clinical Data: Right fourth toe pain, swelling and ulceration. Diabetes.  RIGHT FOOT COMPLETE - 3+ VIEW  Comparison: 10/05/2011.  Findings: Diffuse soft tissue swelling involving the fourth toe and distal flow.  No  bone destruction or  periosteal reaction.  No soft tissue gas.  Joint space narrowing, bony hypertrophy and cystic changes on both sides of the first MTP joint.  Calcaneal spurs.  IMPRESSION:  1.  Right fourth toe soft tissue swelling without evidence of underlying osteomyelitis. 2.  Extensive first MTP joint degenerative changes.  Original Report Authenticated By: Gerald Stabs, M.D.     1. Diabetic foot       MDM  This patient has been seen and evaluated by Dr. Eulis Foster, who has asked to admit the patient and start him on Vanc and Zosyn in ED.    Regular bed, Team 3, Dr/. Marice Potter       Linus Mako, Door 10/31/11 [phone]

## 2011-11-01 ENCOUNTER — Inpatient Hospital Stay (HOSPITAL_COMMUNITY): Payer: BC Managed Care – HMO

## 2011-11-01 ENCOUNTER — Encounter (HOSPITAL_COMMUNITY): Payer: Self-pay | Admitting: Internal Medicine

## 2011-11-01 ENCOUNTER — Other Ambulatory Visit: Payer: Self-pay

## 2011-11-01 DIAGNOSIS — E871 Hypo-osmolality and hyponatremia: Secondary | ICD-10-CM | POA: Diagnosis present

## 2011-11-01 DIAGNOSIS — L0390 Cellulitis, unspecified: Secondary | ICD-10-CM | POA: Diagnosis present

## 2011-11-01 DIAGNOSIS — M869 Osteomyelitis, unspecified: Secondary | ICD-10-CM | POA: Diagnosis present

## 2011-11-01 DIAGNOSIS — E876 Hypokalemia: Secondary | ICD-10-CM | POA: Diagnosis present

## 2011-11-01 DIAGNOSIS — IMO0001 Reserved for inherently not codable concepts without codable children: Secondary | ICD-10-CM | POA: Diagnosis present

## 2011-11-01 LAB — GLUCOSE, CAPILLARY
Glucose-Capillary: 280 mg/dL — ABNORMAL HIGH (ref 70–99)
Glucose-Capillary: 226 mg/dL — ABNORMAL HIGH (ref 70–99)
Glucose-Capillary: 261 mg/dL — ABNORMAL HIGH (ref 70–99)
Glucose-Capillary: 272 mg/dL — ABNORMAL HIGH (ref 70–99)

## 2011-11-01 LAB — CARDIAC PANEL(CRET KIN+CKTOT+MB+TROPI)
CK, MB: 2.1 ng/mL (ref 0.3–4.0)
CK, MB: 2.2 ng/mL (ref 0.3–4.0)
Relative Index: INVALID (ref 0.0–2.5)
Relative Index: INVALID (ref 0.0–2.5)
Total CK: 42 U/L (ref 7–232)
Total CK: 45 U/L (ref 7–232)
Troponin I: <0.3 ng/mL (ref <0.30)
Troponin I: <0.3 ng/mL (ref <0.30)

## 2011-11-01 LAB — BASIC METABOLIC PANEL
BUN: 9 mg/dL (ref 6–23)
CO2: 27 mEq/L (ref 19–32)
Calcium: 9 mg/dL (ref 8.4–10.5)
Chloride: 96 mEq/L (ref 96–112)
Creatinine, Ser: 0.67 mg/dL (ref 0.50–1.35)
GFR calc Af Amer: >90 mL/min (ref >90)
GFR calc non Af Amer: >90 mL/min (ref >90)
Glucose, Bld: 274 mg/dL — ABNORMAL HIGH (ref 70–99)
Potassium: 3.6 mEq/L (ref 3.5–5.1)
Sodium: 131 mEq/L — ABNORMAL LOW (ref 135–145)

## 2011-11-01 LAB — CBC
HCT: 35.5 % — ABNORMAL LOW (ref 39.0–52.0)
Hemoglobin: 13 g/dL (ref 13.0–17.0)
MCH: 30.2 pg (ref 26.0–34.0)
MCHC: 36.6 g/dL — ABNORMAL HIGH (ref 30.0–36.0)
MCV: 82.6 fL (ref 78.0–100.0)
Platelets: 157 10*3/uL (ref 150–400)
RBC: 4.3 MIL/uL (ref 4.22–5.81)
RDW: 12.7 % (ref 11.5–15.5)
WBC: 8 10*3/uL (ref 4.0–10.5)

## 2011-11-01 LAB — HEMOGLOBIN A1C
Hgb A1c MFr Bld: 9.4 % — ABNORMAL HIGH (ref <5.7)
Mean Plasma Glucose: 223 mg/dL — ABNORMAL HIGH (ref <117)

## 2011-11-01 MED ORDER — SENNA 8.6 MG PO TABS
1.0000 | ORAL_TABLET | Freq: Two times a day (BID) | ORAL | Status: DC
  Administered 2011-11-01 – 2011-11-11 (×9): 8.6 mg via ORAL
  Filled 2011-11-01 (×22): qty 1

## 2011-11-01 MED ORDER — FLUOXETINE HCL 20 MG PO CAPS
20.0000 mg | ORAL_CAPSULE | Freq: Every day | ORAL | Status: DC
  Administered 2011-11-01 – 2011-11-11 (×11): 20 mg via ORAL
  Filled 2011-11-01 (×12): qty 1

## 2011-11-01 MED ORDER — OLMESARTAN MEDOXOMIL 40 MG PO TABS
40.0000 mg | ORAL_TABLET | Freq: Every day | ORAL | Status: DC
  Administered 2011-11-01 – 2011-11-02 (×2): 40 mg via ORAL
  Filled 2011-11-01 (×4): qty 1

## 2011-11-01 MED ORDER — INSULIN GLARGINE 100 UNIT/ML ~~LOC~~ SOLN
50.0000 [IU] | Freq: Two times a day (BID) | SUBCUTANEOUS | Status: DC
  Administered 2011-11-01 – 2011-11-11 (×20): 50 [IU] via SUBCUTANEOUS
  Filled 2011-11-01 (×5): qty 3

## 2011-11-01 MED ORDER — ONDANSETRON HCL 4 MG/2ML IJ SOLN: 4.0000 mg | Freq: Three times a day (TID) | INTRAMUSCULAR | Status: DC | PRN

## 2011-11-01 MED ORDER — ACETAMINOPHEN 325 MG PO TABS
650.0000 mg | ORAL_TABLET | Freq: Four times a day (QID) | ORAL | Status: DC | PRN
  Administered 2011-11-02 (×2): 650 mg via ORAL
  Filled 2011-11-01 (×2): qty 2

## 2011-11-01 MED ORDER — ASPIRIN 81 MG PO CHEW
CHEWABLE_TABLET | ORAL | Status: AC
  Administered 2011-11-01: 81 mg
  Filled 2011-11-01: qty 3

## 2011-11-01 MED ORDER — AMLODIPINE BESYLATE 10 MG PO TABS
10.0000 mg | ORAL_TABLET | Freq: Every day | ORAL | Status: DC
  Administered 2011-11-01 – 2011-11-02 (×2): 10 mg via ORAL
  Filled 2011-11-01 (×4): qty 1

## 2011-11-01 MED ORDER — HEPARIN SODIUM (PORCINE) 5000 UNIT/ML IJ SOLN
5000.0000 [IU] | Freq: Three times a day (TID) | INTRAMUSCULAR | Status: DC
  Administered 2011-11-01 – 2011-11-05 (×14): 5000 [IU] via SUBCUTANEOUS
  Filled 2011-11-01 (×16): qty 1

## 2011-11-01 MED ORDER — PREGABALIN 75 MG PO CAPS
75.0000 mg | ORAL_CAPSULE | Freq: Two times a day (BID) | ORAL | Status: DC
  Administered 2011-11-01 – 2011-11-11 (×21): 75 mg via ORAL
  Filled 2011-11-01 (×22): qty 1

## 2011-11-01 MED ORDER — ONDANSETRON HCL 4 MG/2ML IJ SOLN: 4.0000 mg | Freq: Four times a day (QID) | INTRAMUSCULAR | Status: DC | PRN

## 2011-11-01 MED ORDER — AMLODIPINE BESYLATE-VALSARTAN 10-320 MG PO TABS: 1.0000 | ORAL_TABLET | Freq: Every day | ORAL | Status: DC

## 2011-11-01 MED ORDER — INSULIN ASPART 100 UNIT/ML ~~LOC~~ SOLN
0.0000 [IU] | Freq: Three times a day (TID) | SUBCUTANEOUS | Status: DC
  Administered 2011-11-01: 8 [IU] via SUBCUTANEOUS
  Administered 2011-11-01: 5 [IU] via SUBCUTANEOUS
  Administered 2011-11-01: 8 [IU] via SUBCUTANEOUS
  Administered 2011-11-02: 2 [IU] via SUBCUTANEOUS
  Administered 2011-11-02: 5 [IU] via SUBCUTANEOUS
  Administered 2011-11-03: 2 [IU] via SUBCUTANEOUS
  Administered 2011-11-03: 8 [IU] via SUBCUTANEOUS
  Administered 2011-11-03: 0 [IU] via SUBCUTANEOUS
  Administered 2011-11-04: 5 [IU] via SUBCUTANEOUS
  Administered 2011-11-06 – 2011-11-07 (×3): 3 [IU] via SUBCUTANEOUS
  Administered 2011-11-08 – 2011-11-09 (×2): 2 [IU] via SUBCUTANEOUS
  Administered 2011-11-09: 3 [IU] via SUBCUTANEOUS
  Administered 2011-11-10: 2 [IU] via SUBCUTANEOUS
  Administered 2011-11-10 – 2011-11-11 (×2): 3 [IU] via SUBCUTANEOUS
  Filled 2011-11-01 (×2): qty 3

## 2011-11-01 MED ORDER — PNEUMOCOCCAL VAC POLYVALENT 25 MCG/0.5ML IJ INJ
0.5000 mL | INJECTION | INTRAMUSCULAR | Status: AC
  Administered 2011-11-02: 0.5 mL via INTRAMUSCULAR
  Filled 2011-11-01: qty 0.5

## 2011-11-01 MED ORDER — POTASSIUM CHLORIDE CRYS ER 20 MEQ PO TBCR
40.0000 meq | EXTENDED_RELEASE_TABLET | Freq: Two times a day (BID) | ORAL | Status: AC
  Administered 2011-11-01 (×2): 40 meq via ORAL
  Filled 2011-11-01 (×2): qty 2

## 2011-11-01 MED ORDER — ASPIRIN EC 81 MG PO TBEC
81.0000 mg | DELAYED_RELEASE_TABLET | Freq: Two times a day (BID) | ORAL | Status: DC
  Administered 2011-11-01 – 2011-11-11 (×22): 81 mg via ORAL
  Filled 2011-11-01 (×26): qty 1

## 2011-11-01 MED ORDER — ACETAMINOPHEN 650 MG RE SUPP: 650.0000 mg | Freq: Four times a day (QID) | RECTAL | Status: DC | PRN

## 2011-11-01 MED ORDER — METOCLOPRAMIDE HCL 5 MG PO TABS
5.0000 mg | ORAL_TABLET | Freq: Three times a day (TID) | ORAL | Status: DC
  Administered 2011-11-01 – 2011-11-11 (×42): 5 mg via ORAL
  Filled 2011-11-01 (×47): qty 1

## 2011-11-01 MED ORDER — SODIUM CHLORIDE 0.9 % IV SOLN: INTRAVENOUS | Status: DC

## 2011-11-01 MED ORDER — DOCUSATE SODIUM 100 MG PO CAPS
100.0000 mg | ORAL_CAPSULE | Freq: Two times a day (BID) | ORAL | Status: DC
  Administered 2011-11-01 – 2011-11-11 (×10): 100 mg via ORAL
  Filled 2011-11-01 (×23): qty 1

## 2011-11-01 MED ORDER — CARVEDILOL 25 MG PO TABS
25.0000 mg | ORAL_TABLET | Freq: Two times a day (BID) | ORAL | Status: DC
  Administered 2011-11-01 – 2011-11-11 (×21): 25 mg via ORAL
  Filled 2011-11-01 (×27): qty 1

## 2011-11-01 MED ORDER — ONDANSETRON HCL 4 MG PO TABS: 4.0000 mg | ORAL_TABLET | Freq: Four times a day (QID) | ORAL | Status: DC | PRN

## 2011-11-01 MED ORDER — MORPHINE SULFATE 4 MG/ML IJ SOLN
4.0000 mg | INTRAMUSCULAR | Status: DC | PRN
  Administered 2011-11-01 – 2011-11-06 (×23): 4 mg via INTRAVENOUS
  Filled 2011-11-01 (×17): qty 1
  Filled 2011-11-01: qty 4
  Filled 2011-11-01 (×6): qty 1

## 2011-11-01 MED ORDER — VANCOMYCIN HCL IN DEXTROSE 1-5 GM/200ML-% IV SOLN
1000.0000 mg | Freq: Three times a day (TID) | INTRAVENOUS | Status: DC
  Administered 2011-11-01 – 2011-11-08 (×22): 1000 mg via INTRAVENOUS
  Filled 2011-11-01 (×26): qty 200

## 2011-11-01 MED ORDER — PANTOPRAZOLE SODIUM 20 MG PO TBEC
20.0000 mg | DELAYED_RELEASE_TABLET | Freq: Every day | ORAL | Status: DC
  Administered 2011-11-01 – 2011-11-11 (×11): 20 mg via ORAL
  Filled 2011-11-01 (×15): qty 1

## 2011-11-01 MED ORDER — HYDRALAZINE HCL 20 MG/ML IJ SOLN
5.0000 mg | Freq: Four times a day (QID) | INTRAMUSCULAR | Status: DC | PRN
  Administered 2011-11-11: 5 mg via INTRAVENOUS
  Filled 2011-11-01 (×3): qty 0.25

## 2011-11-01 MED ORDER — PIPERACILLIN-TAZOBACTAM 3.375 G IVPB
3.3750 g | Freq: Three times a day (TID) | INTRAVENOUS | Status: DC
  Administered 2011-11-01 – 2011-11-09 (×25): 3.375 g via INTRAVENOUS
  Filled 2011-11-01 (×28): qty 50

## 2011-11-01 MED ORDER — MORPHINE SULFATE 4 MG/ML IJ SOLN
4.0000 mg | Freq: Once | INTRAMUSCULAR | Status: AC
  Administered 2011-11-01: 4 mg via INTRAVENOUS
  Filled 2011-11-01: qty 1

## 2011-11-01 NOTE — Progress Notes (Signed)
   Seen earlier today by my colleague Dr. Ria Bush.  Patient seen and examined, database reviewed.  Left fourth and fifth toe cellulitis, failed outpatient therapy.  Admitted for IV antibiotics and to rule out osteomyelitis.  Uncontrolled DM 2 and hypertension.  [name] [id number] 11/01/2011, 8:19 AM

## 2011-11-01 NOTE — ED Provider Notes (Signed)
Medical screening examination/treatment/procedure(s) were conducted as a shared visit with non-physician practitioner(s) and myself.  I personally evaluated the patient during the encounter  Richarda Blade, MD 11/01/11 [phone]

## 2011-11-01 NOTE — Consult Note (Signed)
WOC consult Note Reason for Consult: Right foot with full thickness wound in space between 4th and 5th toes.  Pt states area began hurting and draining during past week, despite antibiotic he was taking for several weeks. Wound type: Full thickness Measurement: .8X.8X.2cm, bone palpable, pale white wound bed, mod yellow drainage, no odor. Xray did not show osteomyelitis; MRI results pending.  If osteomyelitis is confirmed, then pt would require ortho consult. Dressing procedure/placement/frequency: Aquacel to absorb drainage and provide antimicrobial benefits.  Will not plan to follow further unless re-consulted.  [address] [place], MSN, [place]

## 2011-11-01 NOTE — H&P (Signed)
PCP:   [name], MD, MD  Confirmed with pt  Chief Complaint:  Diabetic foot ulcer  HPI: 20yoM with h/o DM/HTN, COPD/heavy smoker, PAD s/p left CEA 11/2010 and atherectomy /  stenting left popliteal artery 03/2011, TIA's from the left carotid occlusion now  presents with 1 month left 4th digit lateral ulceration, surrounding erythema.   Pt states that he has baseline peripheral neuropathic type pain, however about a  month ago he started getting worsened R foot pain and eventually swelling of his  lateral-most two toes. He eventually noted an ulceration of the 4th digit in between  the fourth and fifth, for which he saw PCP Dr. Wilson Singer, who did an x-ray and started  him on Keflex and Doxycycline which the pt has been taking for the past month,  without much improvement. Eventually the toes have been getting more swollen and also  with a dark purplish, reddish hue, and increased pain. He notes whitish discharge and  also blood on the gauze that he has been putting between his toes. He denies any  frank fevers, chills, sweats. Because he kept having discharge from the toe, he came  to the ED.   In the ED pt was hypertense to max 229/85, now stabilized, otherwise normal. Labs  with hypoNa 130, hypoK 3.4, normal renal and hyperglycemia to >300. WBC 9.4, Hct  38.5. Plain film with right fourth toe soft tissue swelling, no evidence of  osteomyelitis, and 1st MTP degenerative changes. Pt was given morphine x2, zofran x2,  zosyn and vancomycin, and 1L NS.   ROS as above, otherwise with chronic back pain, and nausea. Otherwise negative.   Past Medical History  Diagnosis Date  . Carotid artery occlusion 11/10/10    LEFT CAROTID ENDARTERECTOMY  . Diabetes mellitus   . Hypertension   . Hyperlipidemia   . COPD (chronic obstructive pulmonary disease)   . DJD (degenerative joint disease)   . GERD (gastroesophageal reflux disease)   . Substernal chest pain   . Visual color changes    LEFT EYE; BUT NOT TOTAL BLINDNESS  . Slurred speech     AS PER WIFE IN D/C NOTE 11/10/10  . PAD (peripheral artery disease)     Distal aortogram June 2012. Atherectomy left popliteal artery July 2012.   . Barrett's esophagus   . Fatty liver   . Diverticulosis of colon (without mention of hemorrhage)   . Sleep apnea   . Emphysema of lung     Past Surgical History  Procedure Date  . Laminectomy     X 3 LUMBAR AND X 2 CERVICAL SPINE OPERATIONS  . Carotid endarterectomy 11/10/10  . Hernia repair     LEFT INGUINAL AND UMBILICAL REPAIRS  . Anterior fusion cervical spine 02/06/06    C4-5, C5-6, C6-7; SURGEON DR. MAX COHEN  . Penile prosthesis implant 08/14/05    INFRAPUBIC INSERTION OF INFLATABLE PENILE PROSTHESIS; SURGEON DR. Amalia Hailey  . Cardiac catheterization 10/31/04  . Laparoscopic cholecystectomy w/ cholangiography 11/09/04    SURGEON DR. Luella Cook  . Total knee arthroplasty 07/2002    RIGHT KNEE ; SURGEON  DR. [name] ALSO HAD ARTHROSCOPIC RIGHT KNEE IN  10/2001  . Shoulder arthroscopy   . Cholecystectomy    Medications:  HOME MEDS: Reconciled by name with patient  Prior to Admission medications   Medication Sig Start Date End Date Taking? Authorizing Provider  ALPRAZolam Duanne Moron) 0.5 MG tablet Take 0.25 mg by mouth 2 (two) times daily.  Yes Historical Provider, MD  amLODipine-valsartan (EXFORGE) 10-320 MG per tablet Take 1 tablet by mouth daily.     Yes Historical Provider, MD  aspirin 81 MG EC tablet Take 81 mg by mouth 2 (two) times daily.    Yes Historical Provider, MD  carvedilol (COREG) 12.5 MG tablet Take 25 mg by mouth 2 (two) times daily with a meal.    Yes Historical Provider, MD  cephALEXin (KEFLEX) 500 MG capsule Take 500 mg by mouth 4 (four) times daily. 09/30/11  Yes Historical Provider, MD  Choline Fenofibrate (TRILIPIX) 135 MG capsule Take 135 mg by mouth daily.     Yes Historical Provider, MD  doxycycline (MONODOX) 100 MG capsule Take 100 mg by mouth 2 (two) times  daily. 09/30/11  Yes Historical Provider, MD  FLUoxetine (PROZAC) 20 MG capsule Take 20 mg by mouth daily.    Yes Historical Provider, MD  glyBURIDE-metformin (GLUCOVANCE) 2.5-500 MG per tablet Take 2 tablets by mouth 2 (two) times daily with a meal.    Yes Historical Provider, MD  insulin glargine (LANTUS) 100 UNIT/ML injection Inject 50 Units into the skin 2 (two) times daily.    Yes Historical Provider, MD  lansoprazole (PREVACID) 30 MG capsule Take 30 mg by mouth 2 (two) times daily. 05/08/11 05/07/12 Yes Zenovia Jarred, MD  lidocaine (LIDODERM) 5 % Place 1 patch onto the skin as needed. Remove & Discard patch within 12 hours or as directed by MD    Yes Historical Provider, MD  metoCLOPramide (REGLAN) 5 MG tablet Take 5 mg by mouth 4 (four) times daily.    Yes Historical Provider, MD  omega-3 acid ethyl esters (LOVAZA) 1 G capsule Take 4 g by mouth daily.    Yes Historical Provider, MD  oxyCODONE-acetaminophen (TYLOX) 5-500 MG per capsule Take 1 capsule by mouth every 4 (four) hours as needed. For pain   Yes Historical Provider, MD  potassium chloride SA (K-DUR,KLOR-CON) 20 MEQ tablet Take 20 mEq by mouth daily.  02/20/11  Yes Lauree Chandler, MD  pregabalin (LYRICA) 75 MG capsule Take 75 mg by mouth 2 (two) times daily.   Yes Historical Provider, MD  rOPINIRole (REQUIP) 2 MG tablet Take 2 mg by mouth at bedtime.     Yes Historical Provider, MD  traMADol (ULTRAM) 50 MG tablet Take 50 mg by mouth every 6 (six) hours as needed. For pain   Yes Historical Provider, MD   Allergies:  Allergies  Allergen Reactions  . Adhesive (Tape) Rash    Social History:   reports that he has been smoking Cigarettes.  He has a 17.5 pack-year smoking history. He has never used smokeless tobacco. He reports that he drinks alcohol. He reports that he does not use illicit drugs.  HEAVY SMOKER AND CONTINUES TO SMOKE 1 PPD. DOES NOT EXERCISE REGULARLY.  Wife [phone] Lebron Quam). Has 2 sons and daughter. Still  active  Family History: Family History  Problem Relation Age of Onset  . Heart disease Father   . Diabetes Father   . Cancer Father     PROSTATE  . Arthritis      GRANDMOTHER  . Hypertension      OTHER FAMILY MEMBERS  . Colon cancer Brother     Physical Exam: Filed Vitals:   10/31/11 2023 10/31/11 2228 11/01/11 0015 11/01/11 0115  BP: 192/99 194/82 177/78 118/85  Pulse: 77 88 92 95  Temp: 98.5 F (36.9 C) 98 F (36.7 C) 98.2 F (36.8 C) 98.5 F (  36.9 C)  TempSrc: Oral Oral  Oral  Resp: 17 18 20 22   Weight:    102.5 kg (225 lb 15.5 oz)  SpO2: 99% 97% 99% 99%   Blood pressure 118/85, pulse 95, temperature 98.5 F (36.9 C), temperature source Oral, resp. rate 22, weight 102.5 kg (225 lb 15.5 oz), SpO2 99.00%.  Gen: Middle aged M, average sized not obese, sitting at bedside, appears well and not  toxic, not ill. Able to relate history well.  HEENT Pupils round, reactive, EOMI, sclera clear, normal. Mouth moist, normal  appearing.  Lungs: CTAB no w/c/r, normal, good air movement Heart: Regular, S1/2 normal, no m/g, not tachycardic Abd: Soft, NT ND, benign Extrem: warm, perfusing well, BUE's normal appearing. BLE's have hyperpigmentation.  RLE, starting around the lower 1/3 of the shin, starts showing hard swelling, more  prominent on dorsum of R foot. Lateral 4th and 5th digits have dark purple hue  extending proximally onto foot. On lateral aspect of 4th toe in between 4th and 5th  there is a 1-2cm clean based ulcer with white fibrinous material in the base. No pus  is able to be expressed. Neuro: Alert, attentive, CN 2-12 intact, moves extremities on his own, grossly non- focal    Labs & Imaging Results for orders placed during the hospital encounter of 10/31/11 (from the past 48 hour(s))  GLUCOSE, CAPILLARY     Status: Abnormal   Collection Time   10/31/11  9:21 PM      Component Value Range Comment   Glucose-Capillary 318 (*) 70 - 99 (mg/dL)    Comment 1  Documented in Chart      Comment 2 Notify RN     CBC     Status: Abnormal   Collection Time   10/31/11  9:59 PM      Component Value Range Comment   WBC 9.4  4.0 - 10.5 (K/uL)    RBC 4.71  4.22 - 5.81 (MIL/uL)    Hemoglobin 14.5  13.0 - 17.0 (g/dL)    HCT 38.5 (*) 39.0 - 52.0 (%)    MCV 81.7  78.0 - 100.0 (fL)    MCH 30.8  26.0 - 34.0 (pg)    MCHC 37.7 (*) 30.0 - 36.0 (g/dL) RULED OUT INTERFERING SUBSTANCES   RDW 12.4  11.5 - 15.5 (%)    Platelets 173  150 - 400 (K/uL)   BASIC METABOLIC PANEL     Status: Abnormal   Collection Time   10/31/11  9:59 PM      Component Value Range Comment   Sodium 130 (*) 135 - 145 (mEq/L)    Potassium 3.4 (*) 3.5 - 5.1 (mEq/L)    Chloride 93 (*) 96 - 112 (mEq/L)    CO2 25  19 - 32 (mEq/L)    Glucose, Bld 369 (*) 70 - 99 (mg/dL)    BUN 6  6 - 23 (mg/dL)    Creatinine, Ser 0.57  0.50 - 1.35 (mg/dL)    Calcium 9.6  8.4 - 10.5 (mg/dL)    GFR calc non Af Amer >90  >90 (mL/min)    GFR calc Af Amer >90  >90 (mL/min)    Dg Foot Complete Right  10/31/2011  *RADIOLOGY REPORT*  Clinical Data: Right fourth toe pain, swelling and ulceration. Diabetes.  RIGHT FOOT COMPLETE - 3+ VIEW  Comparison: 10/05/2011.  Findings: Diffuse soft tissue swelling involving the fourth toe and distal flow.  No bone destruction or periosteal reaction.  No  soft tissue gas.  Joint space narrowing, bony hypertrophy and cystic changes on both sides of the first MTP joint.  Calcaneal spurs.  IMPRESSION:  1.  Right fourth toe soft tissue swelling without evidence of underlying osteomyelitis. 2.  Extensive first MTP joint degenerative changes.  Original Report Authenticated By: Gerald Stabs, M.D.    Impression Present on Admission:  .Diabetic foot .Cellulitis .Diabetes mellitus .HTN (hypertension) .Hyponatremia .Hypokalemia  55yoM with h/o DM/HTN, COPD/heavy smoker, PAD s/p left CEA 11/2010 and atherectomy /  stenting left popliteal artery 03/2011, TIA's from the left carotid  occlusion now  presents with 1 month left 4th digit lateral ulceration, surrounding erythema.   1. Diabetic foot ulcer: with mild surrounding cellulitis. He has failed outpt Keflex  and Doxycycline and is admitted for IV ABx and further w/u. He'll need MR foot to r/o  osteomyelitis, and if positive needs surgical consultation. If negative would give a  couple days of IV ABx until improves.   - MR foot, Vanc/Zosyn for now, consider surgical consultation pending imaging. Wound  consult.   2. Diabetes: Holding oral hypoglycemics and will add SSI to home Lantus 50u BID  3. HypoNa: Now s/p IVF's, would just monitor this for now 4. HypoK: Minimal, will give oral repletion.   5. HTN: Continue home exforge, ASA 81, coreg 6. Holding other various home pain meds and other non essential meds.   Regular bed, MC team 3  Presumed full code   Other plans as per orders.  Byrant Valent 11/01/2011, 2:54 AM

## 2011-11-01 NOTE — Progress Notes (Signed)
ANTIBIOTIC CONSULT NOTE - INITIAL  Pharmacy Consult for Vancocin/Zosyn Indication: diabetic wound, r/o osteo  Allergies  Allergen Reactions  . Adhesive (Tape) Rash    Patient Measurements: Height: 6' 2.02" (188 cm) Weight: 225 lb 15.5 oz (102.5 kg) IBW/kg (Calculated) : 82.24   Vital Signs: Temp: 98.5 F (36.9 C) (02/21 0115) Temp src: Oral (02/21 0115) BP: 118/85 mmHg (02/21 0115) Pulse Rate: 95  (02/21 0115)  Labs:  Flo Shanks 10/31/11 2159  WBC 9.4  HGB 14.5  PLT 173  LABCREA --  CREATININE 0.57   Estimated Creatinine Clearance: 130.1 ml/min (by C-G formula based on Cr of 0.57).  Microbiology: No results found for this or any previous visit (from the past 720 hour(s)).  Medical History: Past Medical History  Diagnosis Date  . Carotid artery occlusion 11/10/10    LEFT CAROTID ENDARTERECTOMY  . Diabetes mellitus   . Hypertension   . Hyperlipidemia   . COPD (chronic obstructive pulmonary disease)   . DJD (degenerative joint disease)   . GERD (gastroesophageal reflux disease)   . Substernal chest pain   . 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 Slurred speech     AS PER WIFE IN D/C NOTE 11/10/10  . PAD (peripheral artery disease)     Distal aortogram June 2012. Atherectomy left popliteal artery July 2012.   . Barrett's esophagus   . Fatty liver   . Diverticulosis of colon (without mention of hemorrhage)   . Sleep apnea   . Emphysema of lung     Medications:  Prescriptions prior to admission  Medication Sig Dispense Refill  . ALPRAZolam (XANAX) 0.5 MG tablet Take 0.25 mg by mouth 2 (two) times daily.       Marland Kitchen amLODipine-valsartan (EXFORGE) 10-320 MG per tablet Take 1 tablet by mouth daily.        Marland Kitchen aspirin 81 MG EC tablet Take 81 mg by mouth 2 (two) times daily.       . carvedilol (COREG) 12.5 MG tablet Take 25 mg by mouth 2 (two) times daily with a meal.       . cephALEXin (KEFLEX) 500 MG capsule Take 500 mg by mouth 4 (four) times  daily.      . Choline Fenofibrate (TRILIPIX) 135 MG capsule Take 135 mg by mouth daily.        Marland Kitchen doxycycline (MONODOX) 100 MG capsule Take 100 mg by mouth 2 (two) times daily.      Marland Kitchen FLUoxetine (PROZAC) 20 MG capsule Take 20 mg by mouth daily.       Marland Kitchen glyBURIDE-metformin (GLUCOVANCE) 2.5-500 MG per tablet Take 2 tablets by mouth 2 (two) times daily with a meal.       . insulin glargine (LANTUS) 100 UNIT/ML injection Inject 50 Units into the skin 2 (two) times daily.       . lansoprazole (PREVACID) 30 MG capsule Take 30 mg by mouth 2 (two) times daily.      Marland Kitchen lidocaine (LIDODERM) 5 % Place 1 patch onto the skin as needed. Remove & Discard patch within 12 hours or as directed by MD       . metoCLOPramide (REGLAN) 5 MG tablet Take 5 mg by mouth 4 (four) times daily.       Marland Kitchen omega-3 acid ethyl esters (LOVAZA) 1 G capsule Take 4 g by mouth daily.       Marland Kitchen oxyCODONE-acetaminophen (TYLOX) 5-500 MG per capsule Take 1 capsule  by mouth every 4 (four) hours as needed. For pain      . potassium chloride SA (K-DUR,KLOR-CON) 20 MEQ tablet Take 20 mEq by mouth daily.       . pregabalin (LYRICA) 75 MG capsule Take 75 mg by mouth 2 (two) times daily.      Marland Kitchen rOPINIRole (REQUIP) 2 MG tablet Take 2 mg by mouth at bedtime.        . traMADol (ULTRAM) 50 MG tablet Take 50 mg by mouth every 6 (six) hours as needed. For pain       Scheduled:    . amLODipine  10 mg Oral Daily   And  . olmesartan  40 mg Oral Daily  . aspirin EC  81 mg Oral BID  . carvedilol  25 mg Oral BID WC  . docusate sodium  100 mg Oral BID  . FLUoxetine  20 mg Oral Daily  . heparin  5,000 Units Subcutaneous Q8H  . insulin aspart  0-15 Units Subcutaneous TID WC  . insulin glargine  50 Units Subcutaneous BID  . metoCLOPramide  5 mg Oral TID AC & HS  .  morphine injection  4 mg Intravenous Once  .  morphine injection  4 mg Intravenous Once  . ondansetron  4 mg Intravenous Once  . pantoprazole  20 mg Oral Q1200  . piperacillin-tazobactam  (ZOSYN)  IV  3.375 g Intravenous Once  . potassium chloride  40 mEq Oral BID  . pregabalin  75 mg Oral BID  . senna  1 tablet Oral BID  . sodium chloride  1,000 mL Intravenous Once  . vancomycin  1,000 mg Intravenous Once  . DISCONTD: sodium chloride   Intravenous STAT  . DISCONTD: amLODipine-valsartan  1 tablet Oral Daily   Assessment: 56yo male c/o worsening diabetic foot ulcer, has been on Keflex and doxy as outpatient, mild cellulitis around ulcer, pending MRI to r/o osteo, to begin IV ABX.  Goal of Therapy:  Vancomycin trough level 15-20 mcg/ml (will decrease if osteo ruled out)  Plan:  Rec'd vanc 1g and Zosyn 1g in ED.  Will continue with vancomycin 1000mg  IV Q8H and Zosyn 3.375g IV Q8H and monitor CBC, MRI, Cx, levels prn.  [place] PharmD BCPS 11/01/2011,3:26 AM

## 2011-11-01 NOTE — Progress Notes (Signed)
Utilization Review Completed.[name] T2/21/2013   

## 2011-11-01 NOTE — ED Notes (Signed)
please call wife with any concerns [phone] [name])

## 2011-11-01 NOTE — Progress Notes (Addendum)
11/01/2011  1415  I went in to give patient his 1400 meds, and pt complained that he had been having 10/10 sharp chest pain for a few minutes, and it was still occurring.  Pt denied any SOB, stated that the chest pain was bilateral in nature.  Vitals obtained, which were stable.  After a few minutes, pt stated that his pain had subsided, and it could have just been heartburn.  He states that he has had this pain before PTA, and has been worked up for it, with no problems noted per patient.  Dr. Hartford Poli notified.  Orders received. Will continue to monitor patient. Jennette Banker

## 2011-11-02 DIAGNOSIS — M79609 Pain in unspecified limb: Secondary | ICD-10-CM

## 2011-11-02 LAB — CARDIAC PANEL(CRET KIN+CKTOT+MB+TROPI)
CK, MB: 2.2 ng/mL (ref 0.3–4.0)
Relative Index: INVALID (ref 0.0–2.5)
Total CK: 41 U/L (ref 7–232)
Troponin I: <0.3 ng/mL (ref <0.30)

## 2011-11-02 LAB — BASIC METABOLIC PANEL
BUN: 10 mg/dL (ref 6–23)
CO2: 26 mEq/L (ref 19–32)
Calcium: 9.5 mg/dL (ref 8.4–10.5)
Chloride: 98 mEq/L (ref 96–112)
Creatinine, Ser: 0.69 mg/dL (ref 0.50–1.35)
GFR calc Af Amer: >90 mL/min (ref >90)
GFR calc non Af Amer: >90 mL/min (ref >90)
Glucose, Bld: 207 mg/dL — ABNORMAL HIGH (ref 70–99)
Potassium: 3.7 mEq/L (ref 3.5–5.1)
Sodium: 133 mEq/L — ABNORMAL LOW (ref 135–145)

## 2011-11-02 LAB — GLUCOSE, CAPILLARY
Glucose-Capillary: 94 mg/dL (ref 70–99)
Glucose-Capillary: 219 mg/dL — ABNORMAL HIGH (ref 70–99)
Glucose-Capillary: 131 mg/dL — ABNORMAL HIGH (ref 70–99)
Glucose-Capillary: 214 mg/dL — ABNORMAL HIGH (ref 70–99)

## 2011-11-02 NOTE — Progress Notes (Signed)
*  PRELIMINARY RESULTS* Vascular Ultrasound Right lower extremity venous dopplers has been completed.  Preliminary findings: Right: No obvious evidence of DVT, superficial thrombosis or Bakers cyst.  Diamond Nickel Deneen 11/02/2011, 12:41 PM

## 2011-11-02 NOTE — Progress Notes (Signed)
DAILY PROGRESS NOTE                              GENERAL INTERNAL MEDICINE [facility]  SUBJECTIVE: Feels okay this morning, has some pain in his right lower extremity along the calf  OBJECTIVE: BP 137/77  Pulse 61  Temp(Src) 97.7 F (36.5 C) (Oral)  Resp 10  Ht 6' 2.02" (1.88 m)  Wt 104.055 kg (229 lb 6.4 oz)  BMI 29.44 kg/m2  SpO2 99%  Intake/Output Summary (Last 24 hours) at 11/02/11 0913 Last data filed at 11/01/11 1700  Gross per 24 hour  Intake   1210 ml  Output      0 ml  Net   1210 ml                      Weight change: 1.555 kg (3 lb 6.9 oz) Physical Exam: General: Alert and awake oriented x3 not in any acute distress. HEENT: anicteric sclera, pupils equal reactive to light and accommodation CVS: S1-S2 heard, no murmur rubs or gallops Chest: clear to auscultation bilaterally, no wheezing rales or rhonchi Abdomen:  normal bowel sounds, soft, nontender, nondistended, no organomegaly Neuro: Cranial nerves II-XII intact, no focal neurological deficits Extremities: no cyanosis, +1 edema noted in the right leg   Lab Results:  [facility] 11/02/11 0620 11/01/11 0550  NA 133* 131*  K 3.7 3.6  CL 98 96  CO2 26 27  GLUCOSE 207* 274*  BUN 10 9  CREATININE 0.69 0.67  CALCIUM 9.5 9.0  MG -- --  PHOS -- --   Basename 11/01/11 0550 10/31/11 2159  WBC 8.0 9.4  NEUTROABS -- --  HGB 13.0 14.5  HCT 35.5* 38.5*  MCV 82.6 81.7  PLT 157 173    Basename 11/02/11 0620 11/01/11 2200 11/01/11 1510  CKTOTAL 41 45 42  CKMB 2.2 2.2 2.1  CKMBINDEX -- -- --  TROPONINI <0.30 <0.30 <0.30   No components found with this basename: POCBNP:3 No results found for this basename: DDIMER:2 in the last 72 hours  Basename 11/01/11 1046  HGBA1C 9.4*   Micro Results: No results found for this or any previous visit (from the past 240 hour(s)).  Studies/Results: Mr Foot Right Wo Contrast  11/02/2011  *RADIOLOGY REPORT*  Clinical Data: Diabetic with pain and swelling of the  fourth toe. Question osteomyelitis.  History of great toe injury.  MRI OF THE RIGHT FOREFOOT WITHOUT CONTRAST  Technique:  Multiplanar, multisequence MR imaging was performed. No intravenous contrast was administered.  Comparison: Radiographs 10/31/2011.  Findings: There is extensive soft tissue edema throughout the forefoot, especially within the dorsal subcutaneous fat where there is ill-defined fluid.  There is edema throughout the forefoot musculature.  A small amount of fluid is present within the extensor tendons.  No focal fluid collections are identified. The fourth toe is particularly swollen.  There is diffuse marrow edema throughout the head of the fourth proximal phalanx and the base of the middle phalanx.  This manifests as increased T2 and slightly decreased T1 signal.  Early cortical destruction involving the lateral head of the proximal phalanx is difficult to exclude on the T1-weighted images.  There is no significant interphalangeal joint effusion.  There are advanced arthropathic changes of the first metatarsal phalangeal joint with joint space loss and subchondral cyst formation.  Lesser midfoot degenerative changes are present with scattered subchondral cysts.  There is no subluxation at  the Lisfranc joint.  IMPRESSION:  1.  Diffuse forefoot cellulitis and myofasciitis.  No focal fluid collection demonstrated. 2.  Findings are suspicious for osteomyelitis of the fourth toe surrounding the proximal interphalangeal joint. 3.  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  Original Report Authenticated By: Vivia Ewing, M.D.   Dg Foot Complete Right  10/31/2011  *RADIOLOGY REPORT*  Clinical Data: Right fourth toe pain, swelling and ulceration. Diabetes.  RIGHT FOOT COMPLETE - 3+ VIEW  Comparison: 10/05/2011.  Findings: Diffuse soft tissue swelling involving the fourth toe and distal flow.  No bone destruction or  periosteal reaction.  No soft tissue gas.  Joint space narrowing, bony hypertrophy and cystic changes on both sides of the first MTP joint.  Calcaneal spurs.  IMPRESSION:  1.  Right fourth toe soft tissue swelling without evidence of underlying osteomyelitis. 2.  Extensive first MTP joint degenerative changes.  Original Report Authenticated By: Gerald Stabs, M.D.   Medications: Scheduled Meds:   . amLODipine  10 mg Oral Daily   And  . olmesartan  40 mg Oral Daily  . aspirin EC  81 mg Oral BID  . carvedilol  25 mg Oral BID WC  . docusate sodium  100 mg Oral BID  . FLUoxetine  20 mg Oral Daily  . heparin  5,000 Units Subcutaneous Q8H  . insulin aspart  0-15 Units Subcutaneous TID WC  . insulin glargine  50 Units Subcutaneous BID  . metoCLOPramide  5 mg Oral TID AC & HS  . pantoprazole  20 mg Oral Q1200  . piperacillin-tazobactam (ZOSYN)  IV  3.375 g Intravenous Q8H  . pneumococcal 23 valent vaccine  0.5 mL Intramuscular Tomorrow-1000  . potassium chloride  40 mEq Oral BID  . pregabalin  75 mg Oral BID  . senna  1 tablet Oral BID  . vancomycin  1,000 mg Intravenous Q8H   Continuous Infusions:  PRN Meds:.acetaminophen, acetaminophen, hydrALAZINE, morphine, ondansetron (ZOFRAN) IV, ondansetron  ASSESSMENT & PLAN: Principal Problem:  *Diabetic foot Active Problems:  HTN (hypertension)  Cellulitis  Diabetes mellitus  Hyponatremia  Hypokalemia   Diabetic foot ulcer With cellulitis, failed outpatient Keflex and doxycycline treatment. Patient admitted for further evaluation MRI of the right foot suspicious for osteomyelitis. I'll call his orthopedi in Training and development officer and infectious disease. He is on broad-spectrum antibiotic and vancomycin and Zosyn.  Insulin-dependent diabetes mellitus Started on his home regimen of Lantus 50 units twice a day. And will his diabetes is uncontrolled with hemoglobin A1c of 9.4. Will followup with his CBGs closely in the  hospital.  Hypertension continue his home medication including exploration and Coreg.  Right lower extremity swelling I will rule out DVT by Doppler.   LOS: 2 days   Ceejay Kegley A 11/02/2011, 9:13 AM

## 2011-11-03 LAB — GLUCOSE, CAPILLARY
Glucose-Capillary: 119 mg/dL — ABNORMAL HIGH (ref 70–99)
Glucose-Capillary: 145 mg/dL — ABNORMAL HIGH (ref 70–99)
Glucose-Capillary: 271 mg/dL — ABNORMAL HIGH (ref 70–99)
Glucose-Capillary: 130 mg/dL — ABNORMAL HIGH (ref 70–99)

## 2011-11-03 NOTE — Progress Notes (Addendum)
11-03-11 1446 RN spoke top MD -Pt was unaware that he was receiving norvasc and benicar and did not want to take. RN did contact MD ELmahi and he gave verbal to discontinue medications. Thanks. Medication d/c at 1448.

## 2011-11-03 NOTE — Consult Note (Signed)
Reason for Consult:Osteo of the 4th toe right foot Referring Physician: internal medicine  [name] is an 56 y.o. male.  HPI: Patient reports 3-4 weeks of increased toed pain and swelling. Patient reports po antibiotics from the PCP without relief.  He was admitted for increased pain and swelling in the right foot and leg.  Also history of right total knee from Dr [name].  Past Medical History  Diagnosis Date  . Carotid artery occlusion 11/10/10    LEFT CAROTID ENDARTERECTOMY  . Diabetes mellitus   . Hypertension   . Hyperlipidemia   . COPD (chronic obstructive pulmonary disease)   . DJD (degenerative joint disease)   . GERD (gastroesophageal reflux disease)   . Substernal chest pain   . 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 Slurred speech     AS PER WIFE IN D/C NOTE 11/10/10  . PAD (peripheral artery disease)     Distal aortogram June 2012. Atherectomy left popliteal artery July 2012.   . Barrett's esophagus   . Fatty liver   . Diverticulosis of colon (without mention of hemorrhage)   . Sleep apnea   . Emphysema of lung     Past Surgical History  Procedure Date  . Laminectomy     X 3 LUMBAR AND X 2 CERVICAL SPINE OPERATIONS  . Carotid endarterectomy 11/10/10  . Hernia repair     LEFT INGUINAL AND UMBILICAL REPAIRS  . Anterior fusion cervical spine 02/06/06    C4-5, C5-6, C6-7; SURGEON DR. MAX COHEN  . Penile prosthesis implant 08/14/05    INFRAPUBIC INSERTION OF INFLATABLE PENILE PROSTHESIS; SURGEON DR. Amalia Hailey  . Cardiac catheterization 10/31/04  . Laparoscopic cholecystectomy w/ cholangiography 11/09/04    SURGEON DR. Luella Cook  . Total knee arthroplasty 07/2002    RIGHT KNEE ; SURGEON  DR. [name] ALSO HAD ARTHROSCOPIC RIGHT KNEE IN  10/2001  . Shoulder arthroscopy   . Cholecystectomy     Family History  Problem Relation Age of Onset  . Heart disease Father   . Diabetes Father   . Cancer Father     PROSTATE  . Arthritis      GRANDMOTHER   . Hypertension      OTHER FAMILY MEMBERS  . Colon cancer Brother     Social History:  reports that he has been smoking Cigarettes.  He has a 17.5 pack-year smoking history. He has never used smokeless tobacco. He reports that he drinks alcohol. He reports that he does not use illicit drugs.  Allergies:  Allergies  Allergen Reactions  . Adhesive (Tape) Rash    Medications: I have reviewed the patient's current medications.  Results for orders placed during the hospital encounter of 10/31/11 (from the past 48 hour(s))  GLUCOSE, CAPILLARY     Status: Abnormal   Collection Time   11/01/11  9:09 AM      Component Value Range Comment   Glucose-Capillary 280 (*) 70 - 99 (mg/dL)   HEMOGLOBIN A1C     Status: Abnormal   Collection Time   11/01/11 10:46 AM      Component Value Range Comment   Hemoglobin A1C 9.4 (*) <5.7 (%)    Mean Plasma Glucose 223 (*) <117 (mg/dL)   GLUCOSE, CAPILLARY     Status: Abnormal   Collection Time   11/01/11 11:29 AM      Component Value Range Comment   Glucose-Capillary 226 (*) 70 - 99 (mg/dL)  CARDIAC PANEL(CRET KIN+CKTOT+MB+TROPI)     Status: Normal   Collection Time   11/01/11  3:10 PM      Component Value Range Comment   Total CK 42  7 - 232 (U/L)    CK, MB 2.1  0.3 - 4.0 (ng/mL)    Troponin I <0.30  <0.30 (ng/mL)    Relative Index RELATIVE INDEX IS INVALID  0.0 - 2.5    GLUCOSE, CAPILLARY     Status: Abnormal   Collection Time   11/01/11  4:51 PM      Component Value Range Comment   Glucose-Capillary 261 (*) 70 - 99 (mg/dL)   CARDIAC PANEL(CRET KIN+CKTOT+MB+TROPI)     Status: Normal   Collection Time   11/01/11 10:00 PM      Component Value Range Comment   Total CK 45  7 - 232 (U/L)    CK, MB 2.2  0.3 - 4.0 (ng/mL)    Troponin I <0.30  <0.30 (ng/mL)    Relative Index RELATIVE INDEX IS INVALID  0.0 - 2.5    GLUCOSE, CAPILLARY     Status: Abnormal   Collection Time   11/01/11 10:45 PM      Component Value Range Comment   Glucose-Capillary 272  (*) 70 - 99 (mg/dL)    Comment 1 Notify RN      Comment 2 Documented in Chart     BASIC METABOLIC PANEL     Status: Abnormal   Collection Time   11/02/11  6:20 AM      Component Value Range Comment   Sodium 133 (*) 135 - 145 (mEq/L)    Potassium 3.7  3.5 - 5.1 (mEq/L)    Chloride 98  96 - 112 (mEq/L)    CO2 26  19 - 32 (mEq/L)    Glucose, Bld 207 (*) 70 - 99 (mg/dL)    BUN 10  6 - 23 (mg/dL)    Creatinine, Ser 0.69  0.50 - 1.35 (mg/dL)    Calcium 9.5  8.4 - 10.5 (mg/dL)    GFR calc non Af Amer >90  >90 (mL/min)    GFR calc Af Amer >90  >90 (mL/min)   CARDIAC PANEL(CRET KIN+CKTOT+MB+TROPI)     Status: Normal   Collection Time   11/02/11  6:20 AM      Component Value Range Comment   Total CK 41  7 - 232 (U/L)    CK, MB 2.2  0.3 - 4.0 (ng/mL)    Troponin I <0.30  <0.30 (ng/mL)    Relative Index RELATIVE INDEX IS INVALID  0.0 - 2.5    GLUCOSE, CAPILLARY     Status: Normal   Collection Time   11/02/11  7:52 AM      Component Value Range Comment   Glucose-Capillary 94  70 - 99 (mg/dL)   GLUCOSE, CAPILLARY     Status: Abnormal   Collection Time   11/02/11  1:54 PM      Component Value Range Comment   Glucose-Capillary 219 (*) 70 - 99 (mg/dL)   GLUCOSE, CAPILLARY     Status: Abnormal   Collection Time   11/02/11  5:01 PM      Component Value Range Comment   Glucose-Capillary 131 (*) 70 - 99 (mg/dL)   GLUCOSE, CAPILLARY     Status: Abnormal   Collection Time   11/02/11 10:08 PM      Component Value Range Comment   Glucose-Capillary 214 (*) 70 - 99 (mg/dL)  Mr Foot Right Wo Contrast  11/02/2011  *RADIOLOGY REPORT*  Clinical Data: Diabetic with pain and swelling of the fourth toe. Question osteomyelitis.  History of great toe injury.  MRI OF THE RIGHT FOREFOOT WITHOUT CONTRAST  Technique:  Multiplanar, multisequence MR imaging was performed. No intravenous contrast was administered.  Comparison: Radiographs 10/31/2011.  Findings: There is extensive soft tissue edema throughout the  forefoot, especially within the dorsal subcutaneous fat where there is ill-defined fluid.  There is edema throughout the forefoot musculature.  A small amount of fluid is present within the extensor tendons.  No focal fluid collections are identified. The fourth toe is particularly swollen.  There is diffuse marrow edema throughout the head of the fourth proximal phalanx and the base of the middle phalanx.  This manifests as increased T2 and slightly decreased T1 signal.  Early cortical destruction involving the lateral head of the proximal phalanx is difficult to exclude on the T1-weighted images.  There is no significant interphalangeal joint effusion.  There are advanced arthropathic changes of the first metatarsal phalangeal joint with joint space loss and subchondral cyst formation.  Lesser midfoot degenerative changes are present with scattered subchondral cysts.  There is no subluxation at the Lisfranc joint.  IMPRESSION:  1.  Diffuse forefoot cellulitis and myofasciitis.  No focal fluid collection demonstrated. 2.  Findings are suspicious for osteomyelitis of the fourth toe surrounding the proximal interphalangeal joint. 3.  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  Original Report Authenticated By: Vivia Ewing, M.D.    ROS Blood pressure 154/66, pulse 80, temperature 99.5 F (37.5 C), temperature source Oral, resp. rate 18, height 6\' 2"  (1.88 m), weight 106.323 kg (234 lb 6.4 oz), SpO2 97.00%. Physical Exam Right foot and leg, moderate edema, no fluctuance, no erythema Right fourth toe swollen with macerated 2-54mm ulcer on the lateral border between the 4th and 5th toes. Sensation intact but diminished globally. Rotation and alignment of the digit are normal. 5th toe not swollen. Assessment/Plan: 56 yo active smoker and diabetic with MRI suggestion of osteo of the proximal phalanx of the right 4th toe.  I  reviewed this case with Dr Wylene Simmer who has agreed to assume surgical care of this if the patient decides to proceed with amputation. I discussed this with the patient who is considering the surgical option.  Dr [name] would like for the IV antibiotics to continue and for the swelling to go down some prior to surgery.  Tentative surgery planned for Tuesday.  If clinical condition worsens, please contact us.  Thanks for this interesting consult.  [place],STEVEN R 11/03/2011, 7:38 AM

## 2011-11-03 NOTE — Progress Notes (Signed)
DAILY PROGRESS NOTE                              GENERAL INTERNAL MEDICINE [facility]  SUBJECTIVE: Feels okay this morning, has some pain in his right lower extremity along the calf  OBJECTIVE: BP 154/66  Pulse 76  Temp(Src) 98.4 F (36.9 C) (Oral)  Resp 17  Ht 6\' 2"  (1.88 m)  Wt 106.323 kg (234 lb 6.4 oz)  BMI 30.10 kg/m2  SpO2 98%  Intake/Output Summary (Last 24 hours) at 11/03/11 1033 Last data filed at 11/03/11 0843  Gross per 24 hour  Intake    480 ml  Output      0 ml  Net    480 ml                      Weight change: 2.268 kg (5 lb) Physical Exam: General: Alert and awake oriented x3 not in any acute distress. HEENT: anicteric sclera, pupils equal reactive to light and accommodation CVS: S1-S2 heard, no murmur rubs or gallops Chest: clear to auscultation bilaterally, no wheezing rales or rhonchi Abdomen:  normal bowel sounds, soft, nontender, nondistended, no organomegaly Neuro: Cranial nerves II-XII intact, no focal neurological deficits Extremities: no cyanosis, +1 edema noted in the right leg   Lab Results:  [facility] 11/02/11 0620 11/01/11 0550  NA 133* 131*  K 3.7 3.6  CL 98 96  CO2 26 27  GLUCOSE 207* 274*  BUN 10 9  CREATININE 0.69 0.67  CALCIUM 9.5 9.0  MG -- --  PHOS -- --    Basename 11/01/11 0550 10/31/11 2159  WBC 8.0 9.4  NEUTROABS -- --  HGB 13.0 14.5  HCT 35.5* 38.5*  MCV 82.6 81.7  PLT 157 173    Basename 11/02/11 0620 11/01/11 2200 11/01/11 1510  CKTOTAL 41 45 42  CKMB 2.2 2.2 2.1  CKMBINDEX -- -- --  TROPONINI <0.30 <0.30 <0.30   No components found with this basename: POCBNP:3 No results found for this basename: DDIMER:2 in the last 72 hours  Basename 11/01/11 1046  HGBA1C 9.4*   Micro Results: No results found for this or any previous visit (from the past 240 hour(s)).  Studies/Results: Mr Foot Right Wo Contrast  11/02/2011  *RADIOLOGY REPORT*  Clinical Data: Diabetic with pain and swelling of the fourth toe.  Question osteomyelitis.  History of great toe injury.  MRI OF THE RIGHT FOREFOOT WITHOUT CONTRAST  Technique:  Multiplanar, multisequence MR imaging was performed. No intravenous contrast was administered.  Comparison: Radiographs 10/31/2011.  Findings: There is extensive soft tissue edema throughout the forefoot, especially within the dorsal subcutaneous fat where there is ill-defined fluid.  There is edema throughout the forefoot musculature.  A small amount of fluid is present within the extensor tendons.  No focal fluid collections are identified. The fourth toe is particularly swollen.  There is diffuse marrow edema throughout the head of the fourth proximal phalanx and the base of the middle phalanx.  This manifests as increased T2 and slightly decreased T1 signal.  Early cortical destruction involving the lateral head of the proximal phalanx is difficult to exclude on the T1-weighted images.  There is no significant interphalangeal joint effusion.  There are advanced arthropathic changes of the first metatarsal phalangeal joint with joint space loss and subchondral cyst formation.  Lesser midfoot degenerative changes are present with scattered subchondral cysts.  There is no subluxation  at the Lisfranc joint.  IMPRESSION:  1.  Diffuse forefoot cellulitis and myofasciitis.  No focal fluid collection demonstrated. 2.  Findings are suspicious for osteomyelitis of the fourth toe surrounding the proximal interphalangeal joint. 3.  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  Original Report Authenticated By: Vivia Ewing, M.D.   Dg Foot Complete Right  10/31/2011  *RADIOLOGY REPORT*  Clinical Data: Right fourth toe pain, swelling and ulceration. Diabetes.  RIGHT FOOT COMPLETE - 3+ VIEW  Comparison: 10/05/2011.  Findings: Diffuse soft tissue swelling involving the fourth toe and distal flow.  No bone destruction or periosteal  reaction.  No soft tissue gas.  Joint space narrowing, bony hypertrophy and cystic changes on both sides of the first MTP joint.  Calcaneal spurs.  IMPRESSION:  1.  Right fourth toe soft tissue swelling without evidence of underlying osteomyelitis. 2.  Extensive first MTP joint degenerative changes.  Original Report Authenticated By: Gerald Stabs, M.D.   Medications: Scheduled Meds:    . amLODipine  10 mg Oral Daily   And  . olmesartan  40 mg Oral Daily  . aspirin EC  81 mg Oral BID  . carvedilol  25 mg Oral BID WC  . docusate sodium  100 mg Oral BID  . FLUoxetine  20 mg Oral Daily  . heparin  5,000 Units Subcutaneous Q8H  . insulin aspart  0-15 Units Subcutaneous TID WC  . insulin glargine  50 Units Subcutaneous BID  . metoCLOPramide  5 mg Oral TID AC & HS  . pantoprazole  20 mg Oral Q1200  . piperacillin-tazobactam (ZOSYN)  IV  3.375 g Intravenous Q8H  . pneumococcal 23 valent vaccine  0.5 mL Intramuscular Tomorrow-1000  . pregabalin  75 mg Oral BID  . senna  1 tablet Oral BID  . vancomycin  1,000 mg Intravenous Q8H   Continuous Infusions:  PRN Meds:.acetaminophen, acetaminophen, hydrALAZINE, morphine, ondansetron (ZOFRAN) IV, ondansetron  ASSESSMENT & PLAN: Principal Problem:  *Diabetic foot Active Problems:  HTN (hypertension)  Cellulitis  Diabetes mellitus  Hyponatremia  Hypokalemia   Diabetic foot ulcer With cellulitis, failed outpatient Keflex and doxycycline treatment. Patient admitted for further evaluation MRI of the right foot suspicious for osteomyelitis. Orthopedic evaluated the patient, and plans for right fourth and fifth toes amputation noted. Likely on Tuesday, meanwhile continue the broad-spectrum antibiotics.  Insulin-dependent diabetes mellitus Started on his home regimen of Lantus 50 units twice a day. And will his diabetes is uncontrolled with hemoglobin A1c of 9.4. Will followup with his CBGs closely in the hospital.  Hypertension continue his  home medication including exploration and Coreg.  Right lower extremity swelling Doppler is negative for DVT   LOS: 3 days   Jesiel Garate A 11/03/2011, 10:33 AM

## 2011-11-04 LAB — GLUCOSE, CAPILLARY
Glucose-Capillary: 118 mg/dL — ABNORMAL HIGH (ref 70–99)
Glucose-Capillary: 114 mg/dL — ABNORMAL HIGH (ref 70–99)
Glucose-Capillary: 239 mg/dL — ABNORMAL HIGH (ref 70–99)
Glucose-Capillary: 282 mg/dL — ABNORMAL HIGH (ref 70–99)

## 2011-11-04 LAB — VANCOMYCIN, TROUGH: Vancomycin Tr: 11.2 ug/mL (ref 10.0–20.0)

## 2011-11-04 MED ORDER — FUROSEMIDE 10 MG/ML IJ SOLN
40.0000 mg | Freq: Once | INTRAMUSCULAR | Status: AC
  Administered 2011-11-04: 40 mg via INTRAVENOUS
  Filled 2011-11-04: qty 4

## 2011-11-04 MED ORDER — AMLODIPINE BESYLATE 10 MG PO TABS
10.0000 mg | ORAL_TABLET | Freq: Every day | ORAL | Status: DC
  Administered 2011-11-04 – 2011-11-11 (×8): 10 mg via ORAL
  Filled 2011-11-04 (×8): qty 1

## 2011-11-04 MED ORDER — AMLODIPINE BESYLATE-VALSARTAN 10-320 MG PO TABS: 1.0000 | ORAL_TABLET | Freq: Every day | ORAL | Status: DC

## 2011-11-04 MED ORDER — POTASSIUM CHLORIDE CRYS ER 20 MEQ PO TBCR
40.0000 meq | EXTENDED_RELEASE_TABLET | Freq: Once | ORAL | Status: AC
  Administered 2011-11-04: 40 meq via ORAL
  Filled 2011-11-04: qty 1

## 2011-11-04 MED ORDER — IRBESARTAN 300 MG PO TABS
300.0000 mg | ORAL_TABLET | Freq: Every day | ORAL | Status: DC
  Administered 2011-11-04 – 2011-11-11 (×8): 300 mg via ORAL
  Filled 2011-11-04 (×8): qty 1

## 2011-11-04 NOTE — Progress Notes (Signed)
DAILY PROGRESS NOTE                              [facility]  SUBJECTIVE: Felt sick last night but he is okay this morning   OBJECTIVE: BP 164/81  Pulse 71  Temp(Src) 97.3 F (36.3 C) (Oral)  Resp 20  Ht 6\' 2"  (1.88 m)  Wt 106 kg (233 lb 11 oz)  BMI 30.00 kg/m2  SpO2 100%  Intake/Output Summary (Last 24 hours) at 11/04/11 1041 Last data filed at 11/04/11 0900  Gross per 24 hour  Intake   2710 ml  Output      0 ml  Net   2710 ml                      Weight change: -0.323 kg (-11.4 oz) Physical Exam: General: Alert and awake oriented x3 not in any acute distress. HEENT: anicteric sclera, pupils equal reactive to light and accommodation CVS: S1-S2 heard, no murmur rubs or gallops Chest: clear to auscultation bilaterally, no wheezing rales or rhonchi Abdomen:  normal bowel sounds, soft, nontender, nondistended, no organomegaly Neuro: Cranial nerves II-XII intact, no focal neurological deficits Extremities: no cyanosis, +1 edema noted in the right leg   Lab Results:  [facility] 11/02/11 0620  NA 133*  K 3.7  CL 98  CO2 26  GLUCOSE 207*  BUN 10  CREATININE 0.69  CALCIUM 9.5  MG --  PHOS --   No results found for this basename: WBC:2,NEUTROABS:2,HGB:2,HCT:2,MCV:2,PLT:2 in the last 72 hours  Basename 11/02/11 0620 11/01/11 2200 11/01/11 1510  CKTOTAL 41 45 42  CKMB 2.2 2.2 2.1  CKMBINDEX -- -- --  TROPONINI <0.30 <0.30 <0.30   No components found with this basename: POCBNP:3 No results found for this basename: DDIMER:2 in the last 72 hours  Basename 11/01/11 1046  HGBA1C 9.4*   Micro Results: No results found for this or any previous visit (from the past 240 hour(s)).  Studies/Results: Mr Foot Right Wo Contrast  11/02/2011  *RADIOLOGY REPORT*  Clinical Data: Diabetic with pain and swelling of the fourth toe. Question osteomyelitis.  History of great toe injury.  MRI OF THE RIGHT FOREFOOT WITHOUT CONTRAST  Technique:   Multiplanar, multisequence MR imaging was performed. No intravenous contrast was administered.  Comparison: Radiographs 10/31/2011.  Findings: There is extensive soft tissue edema throughout the forefoot, especially within the dorsal subcutaneous fat where there is ill-defined fluid.  There is edema throughout the forefoot musculature.  A small amount of fluid is present within the extensor tendons.  No focal fluid collections are identified. The fourth toe is particularly swollen.  There is diffuse marrow edema throughout the head of the fourth proximal phalanx and the base of the middle phalanx.  This manifests as increased T2 and slightly decreased T1 signal.  Early cortical destruction involving the lateral head of the proximal phalanx is difficult to exclude on the T1-weighted images.  There is no significant interphalangeal joint effusion.  There are advanced arthropathic changes of the first metatarsal phalangeal joint with joint space loss and subchondral cyst formation.  Lesser midfoot degenerative changes are present with scattered subchondral cysts.  There is no subluxation at the Lisfranc joint.  IMPRESSION:  1.  Diffuse forefoot cellulitis and myofasciitis.  No focal fluid collection demonstrated. 2.  Findings are suspicious for osteomyelitis of the fourth toe surrounding the proximal interphalangeal joint. 3.  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  Original Report Authenticated By: Vivia Ewing, M.D.   Dg Foot Complete Right  10/31/2011  *RADIOLOGY REPORT*  Clinical Data: Right fourth toe pain, swelling and ulceration. Diabetes.  RIGHT FOOT COMPLETE - 3+ VIEW  Comparison: 10/05/2011.  Findings: Diffuse soft tissue swelling involving the fourth toe and distal flow.  No bone destruction or periosteal reaction.  No soft tissue gas.  Joint space narrowing, bony hypertrophy and cystic changes on both sides of the  first MTP joint.  Calcaneal spurs.  IMPRESSION:  1.  Right fourth toe soft tissue swelling without evidence of underlying osteomyelitis. 2.  Extensive first MTP joint degenerative changes.  Original Report Authenticated By: Gerald Stabs, M.D.   Medications: Scheduled Meds:    . aspirin EC  81 mg Oral BID  . carvedilol  25 mg Oral BID WC  . docusate sodium  100 mg Oral BID  . FLUoxetine  20 mg Oral Daily  . heparin  5,000 Units Subcutaneous Q8H  . insulin aspart  0-15 Units Subcutaneous TID WC  . insulin glargine  50 Units Subcutaneous BID  . metoCLOPramide  5 mg Oral TID AC & HS  . pantoprazole  20 mg Oral Q1200  . piperacillin-tazobactam (ZOSYN)  IV  3.375 g Intravenous Q8H  . pregabalin  75 mg Oral BID  . senna  1 tablet Oral BID  . vancomycin  1,000 mg Intravenous Q8H  . DISCONTD: amLODipine  10 mg Oral Daily  . DISCONTD: olmesartan  40 mg Oral Daily   Continuous Infusions:  PRN Meds:.acetaminophen, acetaminophen, hydrALAZINE, morphine, ondansetron (ZOFRAN) IV, ondansetron  ASSESSMENT & PLAN: Principal Problem:  *Diabetic foot Active Problems:  HTN (hypertension)  Cellulitis  Diabetes mellitus  Hyponatremia  Hypokalemia   Diabetic foot ulcer With cellulitis, failed outpatient Keflex and doxycycline treatment. Patient admitted for further evaluation MRI of the right foot suspicious for osteomyelitis. Orthopedic evaluated the patient, and plans for right fourth and fifth toes amputation noted. Likely on Tuesday, meanwhile continue the broad-spectrum antibiotics.  Insulin-dependent diabetes mellitus Started on his home regimen of Lantus 50 units twice a day. And will his diabetes is uncontrolled with hemoglobin A1c of 9.4. Will followup with his CBGs closely in the hospital.  Hypertension -He refused to take the amlodipine/Benicar yesterday and only to the Coreg. Blood pressure the high side -Explained to him that he is on Exforge which at home and that has to  medications which is amlodipine/Benicar was being given separately in the hospital  Right lower extremity swelling Doppler is negative for DVT, we'll keep the leg elevated.   LOS: 4 days   Russ Looper A 11/04/2011, 10:41 AM

## 2011-11-04 NOTE — Progress Notes (Signed)
Subjective:     Patient reports pain as moderate.   Denies CP or SOB.  Voiding without difficulty. Positive flatus. Objective: Vital signs in last 24 hours: Temp:  [97.3 F (36.3 C)-98.5 F (36.9 C)] 97.3 F (36.3 C) (02/24 0920) Pulse Rate:  [60-73] 71  (02/24 0920) Resp:  [16-20] 20  (02/24 0920) BP: (119-164)/(66-84) 164/81 mmHg (02/24 0920) SpO2:  [96 %-100 %] 100 % (02/24 0920) Weight:  [106 kg (233 lb 11 oz)] 106 kg (233 lb 11 oz) (02/23 2039)  Intake/Output from previous day: 02/23 0701 - 02/24 0700 In: 2350 [P.O.:1800; IV Piggyback:550] Out: -  Intake/Output this shift: Total I/O In: 480 [P.O.:480] Out: -   No results found for this basename: HGB:5 in the last 72 hours No results found for this basename: WBC:2,RBC:2,HCT:2,PLT:2 in the last 72 hours  Basename 11/02/11 0620  NA 133*  K 3.7  CL 98  CO2 26  BUN 10  CREATININE 0.69  GLUCOSE 207*  CALCIUM 9.5   No results found for this basename: LABPT:2,INR:2 in the last 72 hours  unchanged from previous exam Decreased swelling  Assessment/Plan:     Cont Iv antibiotics Poss amp on Tues by [facility] Cont wound care  Willetta York R. 11/04/2011, 9:25 AM

## 2011-11-04 NOTE — Progress Notes (Signed)
ANTIBIOTIC CONSULT NOTE - FOLLOW UP  Pharmacy Consult for vanc/zosyn Indication: Cellulitis/diabetic foot ulcer, r/o osteo  Allergies  Allergen Reactions  . Adhesive (Tape) Rash    Patient Measurements: Height: 6\' 2"  (188 cm) Weight: 233 lb 11 oz (106 kg) IBW/kg (Calculated) : 82.2   Vital Signs: Temp: 98 F (36.7 C) (02/24 1300) Temp src: Oral (02/24 1300) BP: 117/59 mmHg (02/24 1300) Pulse Rate: 64  (02/24 1300) Intake/Output from previous day: 02/23 0701 - 02/24 0700 In: 2350 [P.O.:1800; IV Piggyback:550] Out: -  Intake/Output from this shift: Total I/O In: 970 [P.O.:720; IV Piggyback:250] Out: -   Labs:  Basename 11/02/11 0620  WBC --  HGB --  PLT --  LABCREA --  CREATININE 0.69   Estimated Creatinine Clearance: 132.1 ml/min (by C-G formula based on Cr of 0.69).  Basename 11/04/11 0630  VANCOTROUGH 11.2  VANCOPEAK --  VANCORANDOM --  GENTTROUGH --  [place] --  AMIKACIN --     Microbiology: No results found for this or any previous visit (from the past 720 hour(s)).  Anti-infectives     Start     Dose/Rate Route Frequency Ordered Stop   11/01/11 0600   vancomycin (VANCOCIN) IVPB 1000 mg/200 mL premix        1,000 mg 200 mL/hr over 60 Minutes Intravenous Every 8 hours 11/01/11 0330     11/01/11 0600  piperacillin-tazobactam (ZOSYN) IVPB 3.375 g       3.375 g 12.5 mL/hr over 240 Minutes Intravenous 3 times per day 11/01/11 0330     10/31/11 2245   vancomycin (VANCOCIN) IVPB 1000 mg/200 mL premix        1,000 mg 200 mL/hr over 60 Minutes Intravenous  Once 10/31/11 2231 11/01/11 0008   10/31/11 2245  piperacillin-tazobactam (ZOSYN) IVPB 3.375 g       3.375 g 12.5 mL/hr over 240 Minutes Intravenous  Once 10/31/11 2231 11/01/11 0308          Assessment: 23 yom admitted after failure of outpatient keflex + doxy for diabetic foot ulcer. On  broad-spectrum abx zosyn + vanc. Vanc trough this AM is at goal for cellulitis but if treating for osteo it is a bit low. Since pt seems to be doing well on his current regimen and no definite diagnosis of osteo, will continue current regimen.   Goal of Therapy:  Vancomycin trough level 10-15 mcg/ml  Plan:  1. Continue vancomycin 1gm IV Q8H - will check another trough and/or adjust dose if clinical picture changes 2. F/u abx plans 3. Continue zosyn 3.375gm IV Q8H  Nekeshia Lenhardt, Rande Lawman 11/04/2011,2:58 PM

## 2011-11-04 NOTE — Progress Notes (Signed)
Pt with reddened area to bilateral ankles and c/o itching. MD, Dr.Elmahi, notified via text page for assessment. [name]

## 2011-11-05 ENCOUNTER — Encounter (HOSPITAL_COMMUNITY): Payer: Self-pay | Admitting: Anesthesiology

## 2011-11-05 ENCOUNTER — Encounter (HOSPITAL_COMMUNITY)
Admission: EM | Disposition: A | Discharge status: Home / Self Care | Payer: Self-pay | Source: Home / Self Care | Attending: Internal Medicine

## 2011-11-05 ENCOUNTER — Inpatient Hospital Stay (HOSPITAL_COMMUNITY): Payer: BC Managed Care – HMO | Admitting: Anesthesiology

## 2011-11-05 HISTORY — PX: AMPUTATION: SHX166

## 2011-11-05 LAB — CBC
HCT: 33.9 % — ABNORMAL LOW (ref 39.0–52.0)
Hemoglobin: 12 g/dL — ABNORMAL LOW (ref 13.0–17.0)
MCH: 29.8 pg (ref 26.0–34.0)
MCHC: 35.4 g/dL (ref 30.0–36.0)
MCV: 84.1 fL (ref 78.0–100.0)
Platelets: 171 10*3/uL (ref 150–400)
RBC: 4.03 MIL/uL — ABNORMAL LOW (ref 4.22–5.81)
RDW: 12.8 % (ref 11.5–15.5)
WBC: 6.9 10*3/uL (ref 4.0–10.5)

## 2011-11-05 LAB — BASIC METABOLIC PANEL
BUN: 11 mg/dL (ref 6–23)
CO2: 31 mEq/L (ref 19–32)
Calcium: 9.6 mg/dL (ref 8.4–10.5)
Chloride: 103 mEq/L (ref 96–112)
Creatinine, Ser: 0.79 mg/dL (ref 0.50–1.35)
GFR calc Af Amer: >90 mL/min (ref >90)
GFR calc non Af Amer: >90 mL/min (ref >90)
Glucose, Bld: 99 mg/dL (ref 70–99)
Potassium: 4 mEq/L (ref 3.5–5.1)
Sodium: 140 mEq/L (ref 135–145)

## 2011-11-05 LAB — GLUCOSE, CAPILLARY
Glucose-Capillary: 95 mg/dL (ref 70–99)
Glucose-Capillary: 180 mg/dL — ABNORMAL HIGH (ref 70–99)
Glucose-Capillary: 134 mg/dL — ABNORMAL HIGH (ref 70–99)
Glucose-Capillary: 85 mg/dL (ref 70–99)
Glucose-Capillary: 222 mg/dL — ABNORMAL HIGH (ref 70–99)

## 2011-11-05 SURGERY — AMPUTATION, FOOT, RAY
Anesthesia: General | Site: Toe | Laterality: Right | Wound class: Dirty or Infected

## 2011-11-05 MED ORDER — LACTATED RINGERS IV SOLN
INTRAVENOUS | Status: DC
  Administered 2011-11-05: 16:00:00 via INTRAVENOUS

## 2011-11-05 MED ORDER — ONDANSETRON HCL 4 MG/2ML IJ SOLN: 4.0000 mg | Freq: Four times a day (QID) | INTRAMUSCULAR | Status: DC | PRN

## 2011-11-05 MED ORDER — METOCLOPRAMIDE HCL 5 MG PO TABS
5.0000 mg | ORAL_TABLET | Freq: Three times a day (TID) | ORAL | Status: DC | PRN
  Filled 2011-11-05: qty 2

## 2011-11-05 MED ORDER — ONDANSETRON HCL 4 MG/2ML IJ SOLN: 4.0000 mg | Freq: Once | INTRAMUSCULAR | Status: AC | PRN

## 2011-11-05 MED ORDER — ONDANSETRON HCL 4 MG PO TABS: 4.0000 mg | ORAL_TABLET | Freq: Four times a day (QID) | ORAL | Status: DC | PRN

## 2011-11-05 MED ORDER — SODIUM CHLORIDE 0.9 % IV SOLN: INTRAVENOUS | Status: DC

## 2011-11-05 MED ORDER — FUROSEMIDE 10 MG/ML IJ SOLN
40.0000 mg | Freq: Once | INTRAMUSCULAR | Status: AC
  Administered 2011-11-05: 40 mg via INTRAVENOUS
  Filled 2011-11-05 (×2): qty 4

## 2011-11-05 MED ORDER — BACITRACIN ZINC 500 UNIT/GM EX OINT
TOPICAL_OINTMENT | CUTANEOUS | Status: DC | PRN
  Administered 2011-11-05: 1 via TOPICAL

## 2011-11-05 MED ORDER — OXYCODONE HCL 5 MG PO TABS: 5.0000 mg | ORAL_TABLET | ORAL | Status: DC | PRN

## 2011-11-05 MED ORDER — LACTATED RINGERS IV SOLN
INTRAVENOUS | Status: DC | PRN
  Administered 2011-11-05: 17:00:00 via INTRAVENOUS

## 2011-11-05 MED ORDER — OXYCODONE HCL 5 MG PO TABS
5.0000 mg | ORAL_TABLET | ORAL | Status: DC | PRN
  Administered 2011-11-05 – 2011-11-07 (×8): 15 mg via ORAL
  Filled 2011-11-05 (×8): qty 3

## 2011-11-05 MED ORDER — SODIUM CHLORIDE 0.9 % IV SOLN
INTRAVENOUS | Status: DC
  Administered 2011-11-05: 1000 mL via INTRAVENOUS

## 2011-11-05 MED ORDER — HYDROMORPHONE HCL PF 1 MG/ML IJ SOLN: 0.2500 mg | INTRAMUSCULAR | Status: DC | PRN

## 2011-11-05 MED ORDER — FENTANYL CITRATE 0.05 MG/ML IJ SOLN
INTRAMUSCULAR | Status: DC | PRN
  Administered 2011-11-05: 100 ug via INTRAVENOUS

## 2011-11-05 MED ORDER — METOCLOPRAMIDE HCL 5 MG/ML IJ SOLN
5.0000 mg | Freq: Three times a day (TID) | INTRAMUSCULAR | Status: DC | PRN
  Administered 2011-11-09: 10 mg via INTRAVENOUS
  Filled 2011-11-05: qty 2

## 2011-11-05 MED ORDER — PROPOFOL 10 MG/ML IV EMUL
INTRAVENOUS | Status: DC | PRN
  Administered 2011-11-05: 160 mg via INTRAVENOUS

## 2011-11-05 SURGICAL SUPPLY — 39 items
BANDAGE ELASTIC 4 VELCRO ST LF (GAUZE/BANDAGES/DRESSINGS) ×1 IMPLANT
BNDG CMPR 9X4 STRL LF SNTH (GAUZE/BANDAGES/DRESSINGS) ×1
BNDG COHESIVE 4X5 TAN STRL (GAUZE/BANDAGES/DRESSINGS) ×2 IMPLANT
BNDG COHESIVE 6X5 TAN STRL LF (GAUZE/BANDAGES/DRESSINGS) ×2 IMPLANT
BNDG ESMARK 4X9 LF (GAUZE/BANDAGES/DRESSINGS) ×2 IMPLANT
CHLORAPREP W/TINT 26ML (MISCELLANEOUS) ×2 IMPLANT
CLOTH BEACON ORANGE TIMEOUT ST (SAFETY) ×2 IMPLANT
CUFF TOURNIQUET SINGLE 34IN LL (TOURNIQUET CUFF) IMPLANT
CUFF TOURNIQUET SINGLE 44IN (TOURNIQUET CUFF) IMPLANT
DRAPE U-SHAPE 47X51 STRL (DRAPES) ×4 IMPLANT
DRSG ADAPTIC 3X8 NADH LF (GAUZE/BANDAGES/DRESSINGS) ×2 IMPLANT
ELECT REM PT RETURN 9FT ADLT (ELECTROSURGICAL) ×2
ELECTRODE REM PT RTRN 9FT ADLT (ELECTROSURGICAL) ×1 IMPLANT
GAUZE SPONGE 4X4 12PLY STRL LF (GAUZE/BANDAGES/DRESSINGS) ×1 IMPLANT
GLOVE BIO SURGEON STRL SZ8 (GLOVE) ×4 IMPLANT
GLOVE BIOGEL PI IND STRL 8 (GLOVE) ×1 IMPLANT
GLOVE BIOGEL PI INDICATOR 8 (GLOVE) ×1
GOWN PREVENTION PLUS XLARGE (GOWN DISPOSABLE) ×2 IMPLANT
GOWN STRL NON-REIN LRG LVL3 (GOWN DISPOSABLE) ×2 IMPLANT
KIT BASIN OR (CUSTOM PROCEDURE TRAY) ×2 IMPLANT
KIT ROOM TURNOVER OR (KITS) ×2 IMPLANT
MANIFOLD NEPTUNE II (INSTRUMENTS) ×2 IMPLANT
NARROW THIN EXTRA SHORT BLADE ×1 IMPLANT
NS IRRIG 1000ML POUR BTL (IV SOLUTION) ×2 IMPLANT
PACK ORTHO EXTREMITY (CUSTOM PROCEDURE TRAY) ×2 IMPLANT
PAD ARMBOARD 7.5X6 YLW CONV (MISCELLANEOUS) ×4 IMPLANT
PAD CAST 4YDX4 CTTN HI CHSV (CAST SUPPLIES) ×1 IMPLANT
PADDING CAST COTTON 4X4 STRL (CAST SUPPLIES) ×2
SPONGE GAUZE 4X4 12PLY (GAUZE/BANDAGES/DRESSINGS) ×2 IMPLANT
SPONGE LAP 18X18 X RAY DECT (DISPOSABLE) ×2 IMPLANT
STAPLER VISISTAT 35W (STAPLE) ×2 IMPLANT
STOCKINETTE IMPERVIOUS LG (DRAPES) IMPLANT
SUCTION FRAZIER TIP 10 FR DISP (SUCTIONS) ×2 IMPLANT
SUT ETHILON 2 0 PSLX (SUTURE) ×4 IMPLANT
TOWEL OR 17X24 6PK STRL BLUE (TOWEL DISPOSABLE) ×2 IMPLANT
TOWEL OR 17X26 10 PK STRL BLUE (TOWEL DISPOSABLE) ×2 IMPLANT
TUBE CONNECTING 12X1/4 (SUCTIONS) ×2 IMPLANT
UNDERPAD 30X30 INCONTINENT (UNDERPADS AND DIAPERS) ×2 IMPLANT
WATER STERILE IRR 1000ML POUR (IV SOLUTION) ×2 IMPLANT

## 2011-11-05 NOTE — Anesthesia Postprocedure Evaluation (Signed)
  Anesthesia Post-op Note  Patient: [name]  Procedure(s) Performed: Procedure(s) (LRB): AMPUTATION RAY (Right)  Patient Location: PACU  Anesthesia Type: General  Level of Consciousness: awake, alert  and oriented  Airway and Oxygen Therapy: Patient Spontanous Breathing  Post-op Pain: mild  Post-op Assessment: Post-op Vital signs reviewed, Patient's Cardiovascular Status Stable, Respiratory Function Stable, Patent Airway and No signs of Nausea or vomiting  Post-op Vital Signs: Reviewed and stable  Complications: No apparent anesthesia complications

## 2011-11-05 NOTE — Preoperative (Signed)
Beta Blockers   Reason not to administer Beta Blockers:Not Applicable 

## 2011-11-05 NOTE — Transfer of Care (Signed)
Immediate Anesthesia Transfer of Care Note  Patient: [name]  Procedure(s) Performed: Procedure(s) (LRB): AMPUTATION RAY (Right)  Patient Location: PACU  Anesthesia Type: General  Level of Consciousness: awake  Airway & Oxygen Therapy: Patient Spontanous Breathing and Patient connected to nasal cannula oxygen  Post-op Assessment: Report given to PACU RN and Post -op Vital signs reviewed and stable  Post vital signs: Reviewed and stable  Complications: No apparent anesthesia complications

## 2011-11-05 NOTE — Anesthesia Procedure Notes (Signed)
Procedure Name: LMA Insertion Date/Time: 10/29/2011 5:01 PM Performed by: Eligha Bridegroom Pre-anesthesia Checklist: Patient identified, Timeout performed, Emergency Drugs available, Suction available and Patient being monitored Patient Re-evaluated:Patient Re-evaluated prior to inductionOxygen Delivery Method: Circle system utilized Preoxygenation: Pre-oxygenation with 100% oxygen Intubation Type: IV induction LMA: LMA inserted LMA Size: 4.0 Number of attempts: 1 Placement Confirmation: positive ETCO2 and breath sounds checked- equal and bilateral Tube secured with: Tape

## 2011-11-05 NOTE — Progress Notes (Signed)
Subjective: Pt denies any new pain in R foot.  He denies n/v/f/c.  Says he want the 4th and 5th toes off to try to avoid infection in his TKA.  Objective: Vital signs in last 24 hours: Temp:  [97.3 F (36.3 C)-98.2 F (36.8 C)] 97.6 F (36.4 C) (02/25 0536) Pulse Rate:  [51-71] 51  (02/25 0536) Resp:  [18-20] 18  (02/25 0536) BP: (117-168)/(58-81) 125/58 mmHg (02/25 0536) SpO2:  [97 %-100 %] 98 % (02/25 0536) Weight:  [112.492 kg (248 lb)] 112.492 kg (248 lb) (02/24 2039)  Intake/Output from previous day: 02/24 0701 - 02/25 0700 In: 2680 [P.O.:1680; IV Piggyback:1000] Out: -  Intake/Output this shift:     Basename 11/05/11 0545  HGB 12.0*    Basename 11/05/11 0545  WBC 6.9  RBC 4.03*  HCT 33.9*  PLT 171    Basename 11/05/11 0545  NA 140  K 4.0  CL 103  CO2 31  BUN 11  CREATININE 0.79  GLUCOSE 99  CALCIUM 9.6   No results found for this basename: LABPT:2,INR:2 in the last 72 hours  PE:  WN WD male in nad.  EOMI.  REsp unlabored.  A and O x 4.  Mood and affect normal.  R foot wound with resolving cellulitis.  Palpable pulses.  No lymphadenopathy.  Active PF and DF of toes and ankle 5/5.  Sens to LT diminished in forefoot.  MRI:  R 4th toe osteomyelitis.  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  Pt desires amputation in an attempt at curing the osteo.  The risks and benefits of the alternative treatment options have been discussed in detail.  The patient wishes to proceed with surgery and specifically understands risks of bleeding, infection, nerve damage, blood clots, need for additional surgery, amputation and death.  To OR this afternoon for amputation of R 4th ant 4th toes and MT heads.   Wylene Simmer 11/05/2011, 8:38 AM

## 2011-11-05 NOTE — Progress Notes (Signed)
DAILY PROGRESS NOTE                              [facility]AL [facility]  SUBJECTIVE: Complaining about dryness and itchiness of his lower extremity  OBJECTIVE: BP 125/58  Pulse 51  Temp(Src) 97.6 F (36.4 C) (Oral)  Resp 18  Ht 6\' 2"  (1.88 m)  Wt 112.492 kg (248 lb)  BMI 31.84 kg/m2  SpO2 98%  Intake/Output Summary (Last 24 hours) at 11/05/11 0940 Last data filed at 11/05/11 [id number]  Gross per 24 hour  Intake   1950 ml  Output      0 ml  Net   1950 ml                      Weight change: 6.492 kg (14 lb 5 oz) Physical Exam: General: Alert and awake oriented x3 not in any acute distress. HEENT: anicteric sclera, pupils equal reactive to light and accommodation CVS: S1-S2 heard, no murmur rubs or gallops Chest: clear to auscultation bilaterally, no wheezing rales or rhonchi Abdomen:  normal bowel sounds, soft, nontender, nondistended, no organomegaly Neuro: Cranial nerves II-XII intact, no focal neurological deficits Extremities: no cyanosis, +1 edema bilaterally   Lab Results:  Basename 11/05/11 0545  NA 140  K 4.0  CL 103  CO2 31  GLUCOSE 99  BUN 11  CREATININE 0.79  CALCIUM 9.6  MG --  PHOS --    Basename 11/05/11 0545  WBC 6.9  NEUTROABS --  HGB 12.0*  HCT 33.9*  MCV 84.1  PLT 171  Micro Results: No results found for this or any previous visit (from the past 240 hour(s)).  Studies/Results: Mr Foot Right Wo Contrast  11/02/2011  *RADIOLOGY REPORT*  Clinical Data: Diabetic with pain and swelling of the fourth toe. Question osteomyelitis.  History of great toe injury.  MRI OF THE RIGHT FOREFOOT WITHOUT CONTRAST  Technique:  Multiplanar, multisequence MR imaging was performed. No intravenous contrast was administered.  Comparison: Radiographs 10/31/2011.  Findings: There is extensive soft tissue edema throughout the forefoot, especially within the dorsal subcutaneous fat where there is ill-defined fluid.  There is edema throughout the  forefoot musculature.  [name] small amount of fluid is present within the extensor tendons.  No focal fluid collections are identified. The fourth toe is particularly swollen.  There is diffuse marrow edema throughout the head of the fourth proximal phalanx and the base of the middle phalanx.  This manifests as increased T2 and slightly decreased T1 signal.  Early cortical destruction involving the lateral head of the proximal phalanx is difficult to exclude on the T1-weighted images.  There is no significant interphalangeal joint effusion.  There are advanced arthropathic changes of the first metatarsal phalangeal joint with joint space loss and subchondral cyst formation.  Lesser midfoot degenerative changes are present with scattered subchondral cysts.  There is no subluxation at the Lisfranc joint.  IMPRESSION:  1.  Diffuse forefoot cellulitis and myofasciitis.  No focal fluid collection demonstrated. 2.  Findings are suspicious for osteomyelitis of the fourth toe surrounding the proximal interphalangeal joint. 3.  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  Original Report Authenticated By: Vivia Ewing, M.D.   Dg Foot Complete Right  10/31/2011  *RADIOLOGY REPORT*  Clinical Data: Right fourth toe pain, swelling and ulceration. Diabetes.  RIGHT FOOT COMPLETE - 3+ VIEW  Comparison: 10/05/2011.  Findings: Diffuse soft tissue swelling involving the fourth toe and distal flow.  No bone destruction or periosteal reaction.  No soft tissue gas.  Joint space narrowing, bony hypertrophy and cystic changes on both sides of the first MTP joint.  Calcaneal spurs.  IMPRESSION:  1.  Right fourth toe soft tissue swelling without evidence of underlying osteomyelitis. 2.  Extensive first MTP joint degenerative changes.  Original Report Authenticated By: Gerald Stabs, M.D.   Medications: Scheduled Meds:    . amLODipine  10 mg Oral Daily   . aspirin EC  81 mg Oral BID  . carvedilol  25 mg Oral BID WC  . docusate sodium  100 mg Oral BID  . FLUoxetine  20 mg Oral Daily  . furosemide  40 mg Intravenous Once  . heparin  5,000 Units Subcutaneous Q8H  . insulin aspart  0-15 Units Subcutaneous TID WC  . insulin glargine  50 Units Subcutaneous BID  . irbesartan  300 mg Oral Daily  . metoCLOPramide  5 mg Oral TID AC & HS  . pantoprazole  20 mg Oral Q1200  . piperacillin-tazobactam (ZOSYN)  IV  3.375 g Intravenous Q8H  . potassium chloride  40 mEq Oral Once  . pregabalin  75 mg Oral BID  . senna  1 tablet Oral BID  . vancomycin  1,000 mg Intravenous Q8H  . DISCONTD: amLODipine-valsartan  1 tablet Oral Daily   Continuous Infusions:  PRN Meds:.acetaminophen, acetaminophen, hydrALAZINE, morphine, ondansetron (ZOFRAN) IV, ondansetron  ASSESSMENT & PLAN: Principal Problem:  *Diabetic foot Active Problems:  HTN (hypertension)  Cellulitis  Diabetes mellitus  Hyponatremia  Hypokalemia   Diabetic foot ulcer With cellulitis, failed outpatient Keflex and doxycycline treatment. Patient admitted for further evaluation MRI of the right foot suspicious for osteomyelitis. Seen by Dr. [name] this morning, for the amputation of fourth and fifth right toes sometime today in the afternoon.  Insulin-dependent diabetes mellitus Started on his home regimen of Lantus 50 units twice [name] day. And will his diabetes is uncontrolled with hemoglobin A1c of 9.4. Will followup with his CBGs closely in the hospital.  Hypertension -He refused to take the amlodipine/Benicar yesterday and only to the Coreg. Blood pressure the high side -Explained to him that he is on Exforge which at home and that has two medications which is amlodipine/Benicar was being given separately in the hospital  Right lower extremity swelling Doppler is negative for DVT, we'll keep the leg elevated.   LOS: 5 days   [name] 11/05/2011, 9:40 AM

## 2011-11-05 NOTE — Progress Notes (Signed)
Orthopedic Tech Progress Note Patient Details:  [name] May 18, 1956 [id number]  Other Ortho Devices Type of Ortho Device: Postop boot Ortho Device Interventions: Application   Cammer, Theodoro Parma 11/05/2011, 6:00 PM

## 2011-11-05 NOTE — Brief Op Note (Signed)
10/31/2011 - 11/05/2011  5:32 PM  PATIENT:  [name]  56 y.o. male  PRE-OPERATIVE DIAGNOSIS:  Osteomyelitis right 4th toe  POST-OPERATIVE DIAGNOSIS:  Osteomyelitis right 4th toe  Procedure(s): Right 4th and 5th ray amputations  SURGEON:  Wylene Simmer, MD  ASSISTANT: n/a  ANESTHESIA:   General  EBL:  minimal   TOURNIQUET:  approx 15 min with ankle esmarch  COMPLICATIONS:  None apparent  DISPOSITION:  Extubated, awake and stable to recovery.  Specimens:  Right 4th and 5th toes and MT heads to path and deep tissue from right 4th toe to micro.  DICTATION ID:  [id number]

## 2011-11-05 NOTE — Anesthesia Preprocedure Evaluation (Signed)
Anesthesia Evaluation  Patient identified by MRN, date of birth, ID band Patient awake    Reviewed: Allergy & Precautions, H&P , NPO status   Airway Mallampati: III TM Distance: >3 FB Neck ROM: Full    Dental  (+) Edentulous Upper and Edentulous Lower   Pulmonary  clear to auscultation        Cardiovascular Regular Normal    Neuro/Psych    GI/Hepatic   Endo/Other    Renal/GU      Musculoskeletal   Abdominal   Peds  Hematology   Anesthesia Other Findings   Reproductive/Obstetrics                           Anesthesia Physical Anesthesia Plan  ASA: III  Anesthesia Plan: General   Post-op Pain Management:    Induction: Intravenous  Airway Management Planned: LMA  Additional Equipment:   Intra-op Plan:   Post-operative Plan:   Informed Consent: I have reviewed the patients History and Physical, chart, labs and discussed the procedure including the risks, benefits and alternatives for the proposed anesthesia with the patient or authorized representative who has indicated his/her understanding and acceptance.     Plan Discussed with: CRNA and Surgeon  Anesthesia Plan Comments: (PVD Type 2 DM CBG 135 Htn Neuropathy  Secondary to #2  Plan GA with LMA  Roberts Gaudy, MD )        Anesthesia Quick Evaluation

## 2011-11-05 NOTE — Progress Notes (Signed)
sbp 180-190, which pt states is normal for him. Dr fitzgerald here and aware.

## 2011-11-06 LAB — BASIC METABOLIC PANEL
BUN: 10 mg/dL (ref 6–23)
CO2: 30 mEq/L (ref 19–32)
Calcium: 9.4 mg/dL (ref 8.4–10.5)
Chloride: 98 mEq/L (ref 96–112)
Creatinine, Ser: 0.76 mg/dL (ref 0.50–1.35)
GFR calc Af Amer: >90 mL/min (ref >90)
GFR calc non Af Amer: >90 mL/min (ref >90)
Glucose, Bld: 84 mg/dL (ref 70–99)
Potassium: 3.7 mEq/L (ref 3.5–5.1)
Sodium: 135 mEq/L (ref 135–145)

## 2011-11-06 LAB — GLUCOSE, CAPILLARY
Glucose-Capillary: 106 mg/dL — ABNORMAL HIGH (ref 70–99)
Glucose-Capillary: 82 mg/dL (ref 70–99)
Glucose-Capillary: 170 mg/dL — ABNORMAL HIGH (ref 70–99)
Glucose-Capillary: 211 mg/dL — ABNORMAL HIGH (ref 70–99)

## 2011-11-06 MED ORDER — FUROSEMIDE 10 MG/ML IJ SOLN
40.0000 mg | Freq: Once | INTRAMUSCULAR | Status: AC
  Administered 2011-11-06: 40 mg via INTRAVENOUS
  Filled 2011-11-06: qty 4

## 2011-11-06 MED ORDER — HYDROMORPHONE HCL PF 1 MG/ML IJ SOLN
0.5000 mg | INTRAMUSCULAR | Status: AC | PRN
  Administered 2011-11-06 – 2011-11-07 (×3): 0.5 mg via INTRAVENOUS
  Filled 2011-11-06 (×3): qty 1

## 2011-11-06 MED ORDER — HYDROMORPHONE HCL PF 1 MG/ML IJ SOLN: 2.0000 mg | INTRAMUSCULAR | Status: DC | PRN

## 2011-11-06 MED ORDER — POTASSIUM CHLORIDE CRYS ER 20 MEQ PO TBCR
40.0000 meq | EXTENDED_RELEASE_TABLET | Freq: Once | ORAL | Status: AC
  Administered 2011-11-06: 40 meq via ORAL
  Filled 2011-11-06 (×2): qty 2

## 2011-11-06 NOTE — Progress Notes (Signed)
ANTIBIOTIC CONSULT NOTE - FOLLOW UP  Pharmacy Consult for Vanc/Zosyn Indication: Cellulitis/diabetic foot ulcer, r/o osteo (now s/p amputation)  Allergies  Allergen Reactions  . Adhesive (Tape) Rash    Patient Measurements: Height: 6\' 2"  (188 cm) Weight: 248 lb (112.492 kg) (standing scale) IBW/kg (Calculated) : 82.2   Vital Signs: Temp: 98.4 F (36.9 C) (02/26 0552) Temp src: Oral (02/26 0552) BP: 168/89 mmHg (02/26 0552) Pulse Rate: 66  (02/26 0552) Intake/Output from previous day: 02/25 0701 - 02/26 0700 In: 2220 [P.O.:720; I.V.:1000; IV Piggyback:500] Out: 900 [Urine:900] Intake/Output from this shift: Total I/O In: 240 [P.O.:240] Out: 400 [Urine:400]  Labs:  [facility] 11/06/11 0535 11/05/11 0545  WBC -- 6.9  HGB -- 12.0*  PLT -- 171  LABCREA -- --  CREATININE 0.76 0.79   Estimated Creatinine Clearance: 135.9 ml/min (by C-G formula based on Cr of 0.76).  Basename 11/04/11 0630  VANCOTROUGH 11.2  VANCOPEAK --  VANCORANDOM --  GENTTROUGH --  [place] --  AMIKACIN --     Microbiology: No results found for this or any previous visit (from the past 720 hour(s)).  Anti-infectives     Start     Dose/Rate Route Frequency Ordered Stop   11/01/11 0600   vancomycin (VANCOCIN) IVPB 1000 mg/200 mL premix        1,000 mg 200 mL/hr over 60 Minutes Intravenous Every 8 hours 11/01/11 0330     11/01/11 0600  piperacillin-tazobactam (ZOSYN) IVPB 3.375 g       3.375 g 12.5 mL/hr over 240 Minutes Intravenous 3 times per day 11/01/11 0330     10/31/11 2245   vancomycin (VANCOCIN) IVPB 1000 mg/200 mL premix        1,000 mg 200 mL/hr over 60 Minutes Intravenous  Once 10/31/11 2231 11/01/11 0008   10/31/11 2245  piperacillin-tazobactam (ZOSYN) IVPB 3.375 g       3.375 g 12.5 mL/hr over 240 Minutes Intravenous  Once 10/31/11 2231 11/01/11 0308          Assessment: 56  y.o. M on Vancomycin/Zosyn for empiric coverage of diabetic foot ulcer and ?R toe osteomyelitis now . The patient failed treatment with keflex + doxy as an outpatient. Last Vancomycin trough was at goal for cellulitis and dose not adjusted at that time as there is no definite diagnosis of osteo. The patient is now s/p 4th/5th R toe amputation on 2/25. Would expect antibiotics to be discontinued or de-escalated soon if margins clean as usual duration once source of infection removed 24-48 hours.   Anticoagulation: None PTA, No VTE px currently -- Hep SQ d/ced for surgery on 2/25.  Infectious Disease: Zosyn/Vanc D#6 (2/21 >> current) for diabetic foot ulcer (s/p failed treatment with keflex + doxy w/o improvement). Afebrile, WBC WNL. Xray neg for osteo however MRI "suspicious for osteo of the 4th toe". No cx, s/p 4th/5th R toe amputation on 2/25. Vanc trough (2/24)~11.2. Dose not increased, will not increase today as likely to d/c abx soon. Will f/u LOT s/p surgery, usual duration once 24-48 hours.  Cardiovascular: Hx HTN/PAD/DL. s/p stent to L popliteal artery BP/24h: 120-190/60-80, HR: 50-70. On norvasc, ASA, coreg, irbesartan. F/u BP management--may be related to surgery/pain Endocrinology: Hx DM. A1c 9.4. CBG/24h: 85-220. On lantus 50 units bid, SSI  (0 units/24h)  Gastrointestinal / Nutrition: Hx GERD.On PPI Nephrology: SCr 0.76, CrCL >100, Lytes ok Best Practices:  No current VTE px (needs to be readdressed post surgery), PPI resumed from PTA for GERD, home meds addressed  Goal of Therapy:  Vancomycin trough level 10-15 mcg/ml  Plan:  1. Continue Vancomycin 1g IV every 8 hours 2. Continue Zosyn 3.375g IV every 8 hours 3. Please address LOT s/p amputations yesterday or antibiotic de-escalation plans 4. Please readdress VTE px s/p surgery on 2/25 5. Will continue to follow renal function, culture results, LOT, and antibiotic de-escalation plans   Alycia Rossetti, PharmD, BCPS Clinical  Pharmacist Pager: [phone] 11/06/2011 9:49 AM

## 2011-11-06 NOTE — Progress Notes (Signed)
IV normal saline  to kvo for iv piggy backs

## 2011-11-06 NOTE — Progress Notes (Signed)
Subjective: 1 Day Post-Op Procedure(s) (LRB): AMPUTATION RAY (Right) Patient reports pain as moderate but controlled with pain meds.  No n/v/f/c.  Objective: Vital signs in last 24 hours: Temp:  [97.5 F (36.4 C)-98.4 F (36.9 C)] 97.5 F (36.4 C) (02/26 1353) Pulse Rate:  [50-66] 56  (02/26 1353) Resp:  [18-22] 18  (02/26 1353) BP: (130-193)/(70-89) 130/70 mmHg (02/26 1353) SpO2:  [93 %-98 %] 96 % (02/26 1353)  Intake/Output from previous day: 02/25 0701 - 02/26 0700 In: 2220 [P.O.:720; I.V.:1000; IV Piggyback:500] Out: 900 [Urine:900] Intake/Output this shift: Total I/O In: 1655 [P.O.:805; I.V.:600; IV Piggyback:250] Out: 2500 [Urine:2500]   Basename 11/05/11 0545  HGB 12.0*    Basename 11/05/11 0545  WBC 6.9  RBC 4.03*  HCT 33.9*  PLT 171    Basename 11/06/11 0535 11/05/11 0545  NA 135 140  K 3.7 4.0  CL 98 103  CO2 30 31  BUN 10 11  CREATININE 0.76 0.79  GLUCOSE 84 99  CALCIUM 9.4 9.6   No results found for this basename: LABPT:2,INR:2 in the last 72 hours    Assessment/Plan: 1 Day Post-Op Procedure(s) (LRB): AMPUTATION RAY (Right) Continue Vanc/zosyn pending cxresults.  Wound consider ID consultation for determination of length of treatment. May resume anticoagulation in this ambulatory WB patient if you deem necessary.  Or consider use of SCDs when in bed.  Pt takes aspirin already.  [name] 11/06/2011, 5:56 PM

## 2011-11-06 NOTE — Op Note (Signed)
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  PROCEDURE: 1. Right fourth ray amputation. 2. Right fifth ray amputation.  SURGEON:  Wylene Simmer, MD  ANESTHESIA:  General.  ESTIMATED BLOOD LOSS:  Minimal.  TOURNIQUET TIME:  Approximately 15 minutes with an ankle Esmarch.  COMPLICATIONS:  None apparent.  DISPOSITION:  Extubated, awake and stable to recovery.  SPECIMENS:  Right fourth and fifth toes and metatarsal heads to pathology and deep tissue from the right fourth toe to microbiology.  INDICATIONS FOR PROCEDURE:  The patient is a 56 year old male with past medical history significant for smoking and diabetes.  He has a history of a right total knee replacement.  He has a right fourth toe with osteomyelitis and a chronic nonhealing ulcer.  He understands the risks and benefits, the alternative treatment options.  He desires surgical treatment for this condition.  In an effort to provide the best possible healing and avoid further complications, the decision was made to amputate both the fourth and the fifth toes as well as their metatarsal heads.  He understands the risks and benefits, the alternative treatment options, and specifically understands risks of bleeding, infection, nerve damage, blood clots, need for additional surgery, and revision, amputation and death.  PROCEDURE IN DETAIL:  After preoperative consent was obtained and the correct operative site was identified, the patient was brought to the operating room  and placed supine on the operating table.  General anesthesia was induced.  The patient was already on therapeutic antibiotics.  Surgical time-out was taken.  The right lower extremity was prepped and draped in standard sterile fashion.  Foot was exsanguinated and a 4 inch Esmarch tourniquet was wrapped around the ankle.  A racket style incision was marked around the bases of the fourth and fifth toes.  The surgical incision was made and sharp dissection was carried down through the skin to the level of the proximal phalanges.  Subperiosteal dissection was carried to the level of the MTP joint.  Both MTP joints were disarticulated and both toes were removed in their entirety.  The metatarsal heads were then exposed. An oscillating saw was used to cut through the metatarsal necks in an oblique fashion Bevelling the cuts plantarly and laterally.  A rasp was used to smooth the cut edges.  The wound was irrigated copiously.  The soft tissues were noted to be healthy at the level of amputation.  The neurovascular bundles were all cauterized.  The wound was irrigated again.  Vertical mattress sutures of 2-0 nylon were used to close the skin edges.  Sterile dressings were applied followed by a compression wrap.  Attention was then turned to the fourth toe on the back  table.  The site of the chronic ulcer deep tissue was obtained using a rongeur from the phalanges.  This was sent as a specimen to microbiology for aerobic and anaerobic culture.  The patient was then awakened from anesthesia and transported to recovery room in stable condition.  FOLLOWUP PLAN:  The patient will be weightbearing as tolerated on his right foot in a hard sole shoe.  He will continue with IV antibiotics and these will be tailored based on results of the cultures.     Wylene Simmer, MD     JH/MEDQ  D:  11/05/2011  T:  11/06/2011  Job:  [id number]

## 2011-11-07 DIAGNOSIS — L97509 Non-pressure chronic ulcer of other part of unspecified foot with unspecified severity: Secondary | ICD-10-CM

## 2011-11-07 DIAGNOSIS — E1169 Type 2 diabetes mellitus with other specified complication: Secondary | ICD-10-CM

## 2011-11-07 DIAGNOSIS — M609 Myositis, unspecified: Secondary | ICD-10-CM | POA: Diagnosis present

## 2011-11-07 LAB — GLUCOSE, CAPILLARY
Glucose-Capillary: 78 mg/dL (ref 70–99)
Glucose-Capillary: 160 mg/dL — ABNORMAL HIGH (ref 70–99)
Glucose-Capillary: 168 mg/dL — ABNORMAL HIGH (ref 70–99)
Glucose-Capillary: 177 mg/dL — ABNORMAL HIGH (ref 70–99)

## 2011-11-07 LAB — BASIC METABOLIC PANEL
BUN: 13 mg/dL (ref 6–23)
CO2: 29 mEq/L (ref 19–32)
Calcium: 9.4 mg/dL (ref 8.4–10.5)
Chloride: 97 mEq/L (ref 96–112)
Creatinine, Ser: 1.03 mg/dL (ref 0.50–1.35)
GFR calc Af Amer: >90 mL/min (ref >90)
GFR calc non Af Amer: 79 mL/min — ABNORMAL LOW (ref >90)
Glucose, Bld: 73 mg/dL (ref 70–99)
Potassium: 4 mEq/L (ref 3.5–5.1)
Sodium: 134 mEq/L — ABNORMAL LOW (ref 135–145)

## 2011-11-07 MED ORDER — MORPHINE SULFATE 4 MG/ML IJ SOLN: 4.0000 mg | INTRAMUSCULAR | Status: DC | PRN

## 2011-11-07 MED ORDER — MORPHINE SULFATE 2 MG/ML IJ SOLN
2.0000 mg | INTRAMUSCULAR | Status: DC | PRN
  Administered 2011-11-07 – 2011-11-11 (×21): 2 mg via INTRAVENOUS
  Filled 2011-11-07 (×21): qty 1

## 2011-11-07 MED ORDER — OXYCODONE HCL 5 MG PO TABS
20.0000 mg | ORAL_TABLET | ORAL | Status: DC | PRN
  Administered 2011-11-07 – 2011-11-11 (×12): 20 mg via ORAL
  Filled 2011-11-07 (×12): qty 4

## 2011-11-07 MED ORDER — SODIUM CHLORIDE 0.9 % IV SOLN
INTRAVENOUS | Status: DC
  Administered 2011-11-07 – 2011-11-08 (×3): via INTRAVENOUS

## 2011-11-07 MED ORDER — SODIUM CHLORIDE 0.9 % IJ SOLN
10.0000 mL | INTRAMUSCULAR | Status: DC | PRN
  Administered 2011-11-10 – 2011-11-11 (×2): 10 mL

## 2011-11-07 NOTE — Progress Notes (Signed)
Subjective: Pain in left lower extremity.   Antibiotics: Vanc: 2.21.2013 >> Zosyn: 2.21.2013 >>  Objective: Filed Vitals:   11/06/11 1847 11/06/11 2121 11/07/11 0600 11/07/11 1000  BP: 106/58 123/58 109/67 117/70  Pulse: 69 58 55 51  Temp: 98.1 F (36.7 C) 97.9 F (36.6 C) 99 F (37.2 C) 98.6 F (37 C)  TempSrc: Oral Oral  Oral  Resp: 18 18 21 18   Height:      Weight:  116.302 kg (256 lb 6.4 oz)    SpO2: 94% 94% 98% 95%   Weight change:   Intake/Output Summary (Last 24 hours) at 11/07/11 1250 Last data filed at 11/07/11 0900  Gross per 24 hour  Intake   2155 ml  Output    900 ml  Net   1255 ml    General: Alert, awake, oriented x3, in no acute distress.  HEENT: No bruits, no goiter.  Heart: Regular rate and rhythm, without murmurs, rubs, gallops.  Lungs: Crackles left side, bilateral air movement.  Abdomen: Soft, nontender, nondistended, positive bowel sounds.  Neuro: Grossly intact, nonfocal. Ext: right lower extremity swollen not tender.   Lab Results:  Basename 11/07/11 0600 11/06/11 0535 11/05/11 0545  NA 134* 135 140  K 4.0 3.7 4.0  CL 97 98 103  CO2 29 30 31   GLUCOSE 73 84 99  BUN 13 10 11   CREATININE 1.03 0.76 0.79  CALCIUM 9.4 9.4 9.6  MG -- -- --  PHOS -- -- --   No results found for this basename: AST:2,ALT:2,ALKPHOS:2,BILITOT:2,PROT:2,ALBUMIN:2 in the last 72 hours No results found for this basename: LIPASE:2,AMYLASE:2 in the last 72 hours  Basename 11/05/11 0545  WBC 6.9  NEUTROABS --  HGB 12.0*  HCT 33.9*  MCV 84.1  PLT 171   No results found for this basename: CKTOTAL:3,CKMB:3,CKMBINDEX:3,TROPONINI:3 in the last 72 hours No components found with this basename: POCBNP:3 No results found for this basename: DDIMER:2 in the last 72 hours No results found for this basename: HGBA1C:2 in the last 72 hours No results found for this basename: CHOL:2,HDL:2,LDLCALC:2,TRIG:2,CHOLHDL:2,LDLDIRECT:2 in the last 72 hours No results found for this  basename: TSH,T4TOTAL,FREET3,T3FREE,THYROIDAB in the last 72 hours No results found for this basename: VITAMINB12:2,FOLATE:2,FERRITIN:2,TIBC:2,IRON:2,RETICCTPCT:2 in the last 72 hours  Micro Results: Recent Results (from the past 240 hour(s))  ANAEROBIC CULTURE     Status: Normal (Preliminary result)   Collection Time   11/05/11  5:17 PM      Component Value Range Status Comment   Specimen Description TISSUE TOE RIGHT   Final    Special Requests RIGHT 4TH TOE DEEP TISSUE PT ON ZOSYN VANCO   Final    Gram Stain PENDING   Incomplete    Culture     Final    Value: NO ANAEROBES ISOLATED; CULTURE IN PROGRESS FOR 5 DAYS   Report Status PENDING   Incomplete   TISSUE CULTURE     Status: Normal (Preliminary result)   Collection Time   11/05/11  5:17 PM      Component Value Range Status Comment   Specimen Description TISSUE TOE RIGHT   Final    Special Requests RIGHT 4TH TOE DEEP TISSUE PT ON ZOSYN VANCO   Final    Gram Stain     Final    Value: RARE WBC PRESENT,BOTH PMN AND MONONUCLEAR     RARE SQUAMOUS EPITHELIAL CELLS PRESENT     NO ORGANISMS SEEN   Culture NO GROWTH 1 DAY   Final  Report Status PENDING   Incomplete     Studies/Results: No results found.  Medications: I have reviewed the patient's current medications.  Assessment and plan: Principal Problem: 1.Osteomyelitis S/p amputation. Cultures continue to be negative. Get ID consult for duration of antibiotics and follow up as an outpatient. -PICC in place. -increase narcotics.  2. HTN (hypertension)  controlled, continue current treatment.  3.Cellulitis On broad spectrum antibiotics.   4.Diabetes mellitus Controlled.  5.Hyponatremia -134 today. Chloride is low creatinine slightly higher today than yesterday  Start IV fluids. b-met in am. -most likely due to decrease PO intake. -strict I and O's.  6. Hypokalemia Resolved.   7.Myofasciitis  resolved.     LOS: 7 days   Charlynne Cousins M.D. Pager:  [phone] [facility] 11/07/2011, 12:50 PM

## 2011-11-07 NOTE — Progress Notes (Signed)
Subjective: 2 Days Post-Op Procedure(s) (LRB): AMPUTATION RAY (Right) Patient reports pain as mild.  No n/v/f/c.  Objective: Vital signs in last 24 hours: Temp:  [97.5 F (36.4 C)-99 F (37.2 C)] 99 F (37.2 C) (02/27 0600) Pulse Rate:  [55-69] 55  (02/27 0600) Resp:  [18-22] 21  (02/27 0600) BP: (106-151)/(58-84) 109/67 mmHg (02/27 0600) SpO2:  [94 %-98 %] 98 % (02/27 0600) Weight:  [116.302 kg (256 lb 6.4 oz)] 116.302 kg (256 lb 6.4 oz) (02/26 2121)  Intake/Output from previous day: 02/26 0701 - 02/27 0700 In: 2155 [P.O.:1045; I.V.:860; IV Piggyback:250] Out: 2500 [Urine:2500] Intake/Output this shift:     Basename 11/05/11 0545  HGB 12.0*    Basename 11/05/11 0545  WBC 6.9  RBC 4.03*  HCT 33.9*  PLT 171    Basename 11/07/11 0600 11/06/11 0535  NA 134* 135  K 4.0 3.7  CL 97 98  CO2 29 30  BUN 13 10  CREATININE 1.03 0.76  GLUCOSE 73 84  CALCIUM 9.4 9.4   No results found for this basename: LABPT:2,INR:2 in the last 72 hours  R foot wound dressed and dry.  Cx:  No growth to date.  Assessment/Plan: 2 Days Post-Op Procedure(s) (LRB): AMPUTATION RAY (Right) Pt will need to continue on IV vanc / zosyn until cx results are final.  Will tailor abx then.  Consider ID consult for length of treatment.  Anticoagulation per hospitalist.  Wylene Simmer 11/07/2011, 7:30 AM

## 2011-11-07 NOTE — Consult Note (Signed)
Date of Admission:  10/31/2011  Date of Consult:  11/07/2011  Reason for Consult: Diabetic Foot Referring Physician: Olevia Bowens  Impression/Recommendation Diabetic Foot Osteomyelitis Would- leave him on vanco and zosyn while awaiting cx.  Place [facility] Plan for 14 days of anbx dated from his surgery.  Watch Cr (in NL range but has come up).   [name] is an 56 y.o. male.  HPI: 56 yo M with hx of DM 25-30 yrs, PVD (prev CEA, popliteal stent),  Adm 2-21 with two months of  R 4th digit ulcer. He initially noted pain between his 4th and 5ht toes, was seen by his PMD and started on doxy and keflex. He has been on this until adm. He began to put gauze between his toes and noted blood and whitish d/c on thgauze. Eventually his toes became more swollen, discolored. He did have swelliing and erythema extending up his leg to about mid calf. He believes that he did have fever off and on at home.  In Ed he was hypertensive, hyponatremic, hypoKalemic, and hyperglycemic. He was started on vanco/zosyn. MRI showed cellulitis/myofascitis, suspicion for osteo of 4th toe. He was eval by ortho and underwent R 4th and 5th ray amputation 11-05-11.  Afebrile in hospital.  Past Medical History  Diagnosis Date  . Carotid artery occlusion 11/10/10    LEFT CAROTID ENDARTERECTOMY  . Diabetes mellitus   . Hypertension   . Hyperlipidemia   . COPD (chronic obstructive pulmonary disease)   . DJD (degenerative joint disease)   . GERD (gastroesophageal reflux disease)   . Substernal chest pain   . 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 Slurred speech     AS PER WIFE IN D/C NOTE 11/10/10  . PAD (peripheral artery disease)     Distal aortogram June 2012. Atherectomy left popliteal artery July 2012.   . Barrett's esophagus   . Fatty liver   . Diverticulosis of colon (without mention of hemorrhage)   . Sleep apnea   . Emphysema of lung     Past Surgical History  Procedure Date  . Laminectomy    X 3 LUMBAR AND X 2 CERVICAL SPINE OPERATIONS  . Carotid endarterectomy 11/10/10  . Hernia repair     LEFT INGUINAL AND UMBILICAL REPAIRS  . Anterior fusion cervical spine 02/06/06    C4-5, C5-6, C6-7; SURGEON DR. MAX COHEN  . Penile prosthesis implant 08/14/05    INFRAPUBIC INSERTION OF INFLATABLE PENILE PROSTHESIS; SURGEON DR. Amalia Hailey  . Cardiac catheterization 10/31/04  . Laparoscopic cholecystectomy w/ cholangiography 11/09/04    SURGEON DR. Luella Cook  . Total knee arthroplasty 07/2002    RIGHT KNEE ; SURGEON  DR. [name] ALSO HAD ARTHROSCOPIC RIGHT KNEE IN  10/2001  . Shoulder arthroscopy   . Cholecystectomy   ergies:   Allergies  Allergen Reactions  . Adhesive (Tape) Rash    Medications:  Scheduled:   . amLODipine  10 mg Oral Daily  . aspirin EC  81 mg Oral BID  . carvedilol  25 mg Oral BID WC  . docusate sodium  100 mg Oral BID  . FLUoxetine  20 mg Oral Daily  . furosemide  40 mg Intravenous Once  . insulin aspart  0-15 Units Subcutaneous TID WC  . insulin glargine  50 Units Subcutaneous BID  . irbesartan  300 mg Oral Daily  . metoCLOPramide  5 mg Oral TID AC & HS  . pantoprazole  20 mg Oral Q1200  . piperacillin-tazobactam (ZOSYN)  IV  3.375 g Intravenous Q8H  . potassium chloride  40 mEq Oral Once  . pregabalin  75 mg Oral BID  . senna  1 tablet Oral BID  . vancomycin  1,000 mg Intravenous Q8H    Social History:  reports that he has been smoking Cigarettes.  He has a 17.5 pack-year smoking history. He has never used smokeless tobacco. He reports that he drinks alcohol. He reports that he does not use illicit drugs.  Family History  Problem Relation Age of Onset  . Heart disease Father   . Diabetes Father   . Cancer Father     PROSTATE  . Arthritis      GRANDMOTHER  . Hypertension      OTHER FAMILY MEMBERS  . Colon cancer Brother     General ROS: nl BM, nl urination, denies neuropathy, see HPI. Had ophtho exam in 2012.   Blood pressure 112/65, pulse 68,  temperature 98.4 F (36.9 C), temperature source Oral, resp. rate 18, height 6\' 2"  (1.88 m), weight 116.302 kg (256 lb 6.4 oz), SpO2 93.00%. Eyes: negative findings: pupils equal, round, reactive to light and accomodation Throat: dry Lungs: clear to auscultation bilaterally Heart: regular rate and rhythm Abdomen: normal findings: bowel sounds normal and soft, non-tender Extremities: R foot has wound along lateral margin that has healthy tissue showing. there is no d/c. the wound is tender. there is no proximal erythema.    Results for orders placed during the hospital encounter of 10/31/11 (from the past 48 hour(s))  ANAEROBIC CULTURE     Status: Normal (Preliminary result)   Collection Time   11/05/11  5:17 PM      Component Value Range Comment   Specimen Description TISSUE TOE RIGHT      Special Requests RIGHT 4TH TOE DEEP TISSUE PT ON ZOSYN VANCO      Gram Stain PENDING      Culture        Value: NO ANAEROBES ISOLATED; CULTURE IN PROGRESS FOR 5 DAYS   Report Status PENDING     TISSUE CULTURE     Status: Normal (Preliminary result)   Collection Time   11/05/11  5:17 PM      Component Value Range Comment   Specimen Description TISSUE TOE RIGHT      Special Requests RIGHT 4TH TOE DEEP TISSUE PT ON ZOSYN VANCO      Gram Stain        Value: RARE WBC PRESENT,BOTH PMN AND MONONUCLEAR     RARE SQUAMOUS EPITHELIAL CELLS PRESENT     NO ORGANISMS SEEN   Culture NO GROWTH 1 DAY      Report Status PENDING     GLUCOSE, CAPILLARY     Status: Normal   Collection Time   11/05/11  5:48 PM      Component Value Range Comment   Glucose-Capillary 85  70 - 99 (mg/dL)   GLUCOSE, CAPILLARY     Status: Abnormal   Collection Time   11/05/11  9:38 PM      Component Value Range Comment   Glucose-Capillary 222 (*) 70 - 99 (mg/dL)   BASIC METABOLIC PANEL     Status: Normal   Collection Time   11/06/11  5:35 AM      Component Value Range Comment   Sodium 135  135 - 145 (mEq/L)    Potassium 3.7  3.5 -  5.1 (mEq/L)  Chloride 98  96 - 112 (mEq/L)    CO2 30  19 - 32 (mEq/L)    Glucose, Bld 84  70 - 99 (mg/dL)    BUN 10  6 - 23 (mg/dL)    Creatinine, Ser 0.76  0.50 - 1.35 (mg/dL)    Calcium 9.4  8.4 - 10.5 (mg/dL)    GFR calc non Af Amer >90  >90 (mL/min)    GFR calc Af Amer >90  >90 (mL/min)   GLUCOSE, CAPILLARY     Status: Abnormal   Collection Time   11/06/11  7:49 AM      Component Value Range Comment   Glucose-Capillary 106 (*) 70 - 99 (mg/dL)    Comment 1 Documented in Chart      Comment 2 Notify RN     GLUCOSE, CAPILLARY     Status: Normal   Collection Time   11/06/11 11:58 AM      Component Value Range Comment   Glucose-Capillary 82  70 - 99 (mg/dL)    Comment 1 Documented in Chart      Comment 2 Notify RN     GLUCOSE, CAPILLARY     Status: Abnormal   Collection Time   11/06/11  4:24 PM      Component Value Range Comment   Glucose-Capillary 170 (*) 70 - 99 (mg/dL)    Comment 1 Documented in Chart      Comment 2 Notify RN     GLUCOSE, CAPILLARY     Status: Abnormal   Collection Time   11/06/11  9:17 PM      Component Value Range Comment   Glucose-Capillary 211 (*) 70 - 99 (mg/dL)   BASIC METABOLIC PANEL     Status: Abnormal   Collection Time   11/07/11  6:00 AM      Component Value Range Comment   Sodium 134 (*) 135 - 145 (mEq/L)    Potassium 4.0  3.5 - 5.1 (mEq/L)    Chloride 97  96 - 112 (mEq/L)    CO2 29  19 - 32 (mEq/L)    Glucose, Bld 73  70 - 99 (mg/dL)    BUN 13  6 - 23 (mg/dL)    Creatinine, Ser 1.03  0.50 - 1.35 (mg/dL)    Calcium 9.4  8.4 - 10.5 (mg/dL)    GFR calc non Af Amer 79 (*) >90 (mL/min)    GFR calc Af Amer >90  >90 (mL/min)   GLUCOSE, CAPILLARY     Status: Normal   Collection Time   11/07/11  7:38 AM      Component Value Range Comment   Glucose-Capillary 78  70 - 99 (mg/dL)    Comment 1 Documented in Chart      Comment 2 Notify RN     GLUCOSE, CAPILLARY     Status: Abnormal   Collection Time   11/07/11 11:37 AM      Component Value Range  Comment   Glucose-Capillary 160 (*) 70 - 99 (mg/dL)    Comment 1 Documented in Chart      Comment 2 Notify RN         Component Value Date/Time   SDES TISSUE TOE RIGHT 11/05/2011 1717   SDES TISSUE TOE RIGHT 11/05/2011 1717   SPECREQUEST RIGHT 4TH TOE DEEP TISSUE PT ON ZOSYN VANCO 11/05/2011 1717   SPECREQUEST RIGHT 4TH TOE DEEP TISSUE PT ON ZOSYN VANCO 11/05/2011 1717   CULT NO ANAEROBES ISOLATED; CULTURE IN PROGRESS  FOR 5 DAYS 11/05/2011 1717   CULT NO GROWTH 1 DAY 11/05/2011 1717   REPTSTATUS PENDING 11/05/2011 1717   REPTSTATUS PENDING 11/05/2011 1717   No results found.  Thank you so much for this interesting consult,   [name] [id number] 11/07/2011, 3:49 PM

## 2011-11-07 NOTE — Evaluation (Signed)
Physical Therapy Evaluation and Discharge Patient Details Name: [name] MRN: [id number] DOB: October 29, 1955 Today's Date: 11/07/2011  Problem List:  Patient Active Problem List  Diagnoses  . PVD (peripheral vascular disease)  . HTN (hypertension)  . Tobacco abuse  . GERD (gastroesophageal reflux disease)  . Barrett's esophagus without dysplasia  . Occlusion and stenosis of carotid artery without mention of cerebral infarction  . Aftercare following surgery of the circulatory system, NEC  . Osteomyelitis  . Cellulitis  . Diabetes mellitus  . Hyponatremia  . Hypokalemia  . Myofasciitis    Past Medical History:  Past Medical History  Diagnosis Date  . Carotid artery occlusion 11/10/10    LEFT CAROTID ENDARTERECTOMY  . Diabetes mellitus   . Hypertension   . Hyperlipidemia   . COPD (chronic obstructive pulmonary disease)   . DJD (degenerative joint disease)   . GERD (gastroesophageal reflux disease)   . Substernal chest pain   . 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 Slurred speech     AS PER WIFE IN D/C NOTE 11/10/10  . PAD (peripheral artery disease)     Distal aortogram June 2012. Atherectomy left popliteal artery July 2012.   . Barrett's esophagus   . Fatty liver   . Diverticulosis of colon (without mention of hemorrhage)   . Sleep apnea   . Emphysema of lung    Past Surgical History:  Past Surgical History  Procedure Date  . Laminectomy     X 3 LUMBAR AND X 2 CERVICAL SPINE OPERATIONS  . Carotid endarterectomy 11/10/10  . Hernia repair     LEFT INGUINAL AND UMBILICAL REPAIRS  . Anterior fusion cervical spine 02/06/06    C4-5, C5-6, C6-7; SURGEON DR. MAX COHEN  . Penile prosthesis implant 08/14/05    INFRAPUBIC INSERTION OF INFLATABLE PENILE PROSTHESIS; SURGEON DR. Amalia Hailey  . Cardiac catheterization 10/31/04  . Laparoscopic cholecystectomy w/ cholangiography 11/09/04    SURGEON DR. Luella Cook  . Total knee arthroplasty 07/2002    RIGHT KNEE  ; SURGEON  DR. [name] ALSO HAD ARTHROSCOPIC RIGHT KNEE IN  10/2001  . Shoulder arthroscopy   . Cholecystectomy     PT Assessment/Plan/Recommendation Clinical Impression Statement: Pt familiar with use of DME from multiple prior surgeries (back, neck, knee) and able to demonstrate safe use of RW.  No further PT needs identified.  Pt instructed to ambulate in hall with RN or family assist for IV pole at least bid. PT Recommendation/Assessment: Patent does not need any further PT services No Skilled PT: All education completed;Patient will have necessary level of assist by caregiver at discharge;Patient is modified independent with all activity/mobility PT Recommendation Follow Up Recommendations: No PT follow up Equipment Recommended: None recommended by PT PT Goals     PT Evaluation Precautions/Restrictions  Precautions Precaution Comments: none Restrictions Weight Bearing Restrictions: No Prior Functioning  Home Living Lives With: Spouse;Son;Daughter Receives Help From:  (none PTA) Type of Home: House Home Layout: One level Home Access: Ramped entrance Bathroom Shower/Tub: Multimedia programmer: Handicapped height Bathroom Accessibility: Yes How Accessible: Accessible via wheelchair [place]: Bedside commode/3-in-1;Crutches;Walker - rolling;Hand-held shower hose Prior Function Level of Independence: Independent with basic ADLs;Independent with gait Driving: Yes Vocation: On disability Cognition Cognition Arousal/Alertness: Awake/alert Overall Cognitive Status: Appears within functional limits for tasks assessed Orientation Level: Oriented X4 Sensation/Coordination   Extremity Assessment RLE Assessment RLE Assessment: Within Functional Limits LLE Assessment LLE Assessment: Within  Functional Limits Mobility (including Balance) Bed Mobility Bed Mobility: Yes Supine to Sit: 7: Independent Sit to Supine: 7: Independent Transfers Transfers:  Yes Sit to Stand: 7: Independent Stand to Sit: 7: Independent Ambulation/Gait Ambulation/Gait: Yes Ambulation/Gait Assistance: 6: Modified independent (Device/Increase time) (with RW vs none) Ambulation Distance (Feet): 300 Feet Assistive device: Rolling walker;None (pt reports RW helps some with pain in Rt foot) Gait Pattern: Within Functional Limits Stairs: No    Exercise    End of Session PT - End of Session Equipment Utilized During Treatment: Other (comment) (Rt foot post-op shoe) Activity Tolerance: Patient tolerated treatment well Patient left: in bed;with call bell in reach (sitting EOB; refused recliner) General Behavior During Session: [facility] for tasks performed  Nora Sabey 11/07/2011, 3:10 PM Pager [phone]

## 2011-11-08 ENCOUNTER — Encounter (HOSPITAL_COMMUNITY): Payer: Self-pay | Admitting: Orthopedic Surgery

## 2011-11-08 LAB — GLUCOSE, CAPILLARY
Glucose-Capillary: 79 mg/dL (ref 70–99)
Glucose-Capillary: 102 mg/dL — ABNORMAL HIGH (ref 70–99)
Glucose-Capillary: 125 mg/dL — ABNORMAL HIGH (ref 70–99)
Glucose-Capillary: 101 mg/dL — ABNORMAL HIGH (ref 70–99)

## 2011-11-08 LAB — BASIC METABOLIC PANEL
BUN: 15 mg/dL (ref 6–23)
CO2: 30 mEq/L (ref 19–32)
Calcium: 9.4 mg/dL (ref 8.4–10.5)
Chloride: 99 mEq/L (ref 96–112)
Creatinine, Ser: 1.06 mg/dL (ref 0.50–1.35)
GFR calc Af Amer: 88 mL/min — ABNORMAL LOW (ref >90)
GFR calc non Af Amer: 76 mL/min — ABNORMAL LOW (ref >90)
Glucose, Bld: 85 mg/dL (ref 70–99)
Potassium: 5 mEq/L (ref 3.5–5.1)
Sodium: 136 mEq/L (ref 135–145)

## 2011-11-08 LAB — HIV ANTIBODY (ROUTINE TESTING W REFLEX): HIV: NONREACTIVE

## 2011-11-08 LAB — VANCOMYCIN, TROUGH: Vancomycin Tr: 15.9 ug/mL (ref 10.0–20.0)

## 2011-11-08 MED ORDER — INSULIN GLARGINE 100 UNIT/ML ~~LOC~~ SOLN
25.0000 [IU] | Freq: Once | SUBCUTANEOUS | Status: AC
  Administered 2011-11-08: 25 [IU] via SUBCUTANEOUS

## 2011-11-08 MED ORDER — SODIUM CHLORIDE 0.45 % IV SOLN
INTRAVENOUS | Status: AC
  Administered 2011-11-08 (×2): via INTRAVENOUS

## 2011-11-08 MED ORDER — VANCOMYCIN HCL 1000 MG IV SOLR
750.0000 mg | Freq: Three times a day (TID) | INTRAVENOUS | Status: DC
  Administered 2011-11-08 – 2011-11-09 (×4): 750 mg via INTRAVENOUS
  Filled 2011-11-08 (×7): qty 750

## 2011-11-08 NOTE — Progress Notes (Signed)
INFECTIOUS DISEASE PROGRESS NOTE  ID: [name] is a 56 y.o. male with   Principal Problem:  *Osteomyelitis Active Problems:  HTN (hypertension)  Cellulitis  Diabetes mellitus  Hyponatremia  Hypokalemia  Myofasciitis  Subjective: Without complaints, does not want stool softener. No pain.   Abtx:  Anti-infectives     Start     Dose/Rate Route Frequency Ordered Stop   11/01/11 0600   vancomycin (VANCOCIN) IVPB 1000 mg/200 mL premix        1,000 mg 200 mL/hr over 60 Minutes Intravenous Every 8 hours 11/01/11 0330     11/01/11 0600  piperacillin-tazobactam (ZOSYN) IVPB 3.375 g       3.375 g 12.5 mL/hr over 240 Minutes Intravenous 3 times per day 11/01/11 0330     10/31/11 2245   vancomycin (VANCOCIN) IVPB 1000 mg/200 mL premix        1,000 mg 200 mL/hr over 60 Minutes Intravenous  Once 10/31/11 2231 11/01/11 0008   10/31/11 2245  piperacillin-tazobactam (ZOSYN) IVPB 3.375 g       3.375 g 12.5 mL/hr over 240 Minutes Intravenous  Once 10/31/11 2231 11/01/11 0308          Medications:  Scheduled:   . amLODipine  10 mg Oral Daily  . aspirin EC  81 mg Oral BID  . carvedilol  25 mg Oral BID WC  . docusate sodium  100 mg Oral BID  . FLUoxetine  20 mg Oral Daily  . insulin aspart  0-15 Units Subcutaneous TID WC  . insulin glargine  50 Units Subcutaneous BID  . irbesartan  300 mg Oral Daily  . metoCLOPramide  5 mg Oral TID AC & HS  . pantoprazole  20 mg Oral Q1200  . piperacillin-tazobactam (ZOSYN)  IV  3.375 g Intravenous Q8H  . pregabalin  75 mg Oral BID  . senna  1 tablet Oral BID  . vancomycin  1,000 mg Intravenous Q8H    Objective: Vital signs in last 24 hours: Temp:  [97.6 F (36.4 C)-98.6 F (37 C)] 98.4 F (36.9 C) (02/28 1321) Pulse Rate:  [52-69] 52  (02/28 1321) Resp:  [18-21] 20  (02/28 1321) BP: (100-148)/(58-70) 105/60 mmHg (02/28 1321) SpO2:  [90 %-97 %] 90 % (02/28 1321) Weight:  [118.8 kg (261 lb 14.5 oz)] 118.8 kg (261 lb 14.5 oz) (02/27  2100)   Incision/Wound: RLE is wrapped, he has decreased light touch in his R toes. Firm edema in leg.   Lab Results  Basename 11/08/11 0615 11/07/11 0600  WBC -- --  HGB -- --  HCT -- --  NA 136 134*  K 5.0 4.0  CL 99 97  CO2 30 29  BUN 15 13  CREATININE 1.06 1.03  GLU -- --   Liver Panel No results found for this basename: PROT:2,ALBUMIN:2,AST:2,ALT:2,ALKPHOS:2,BILITOT:2,BILIDIR:2,IBILI:2 in the last 72 hours Sedimentation Rate No results found for this basename: ESRSEDRATE in the last 72 hours C-Reactive Protein No results found for this basename: CRP:2 in the last 72 hours  Microbiology: Recent Results (from the past 240 hour(s))  ANAEROBIC CULTURE     Status: Normal (Preliminary result)   Collection Time   11/05/11  5:17 PM      Component Value Range Status Comment   Specimen Description TISSUE TOE RIGHT   Final    Special Requests RIGHT 4TH TOE DEEP TISSUE PT ON ZOSYN VANCO   Final    Gram Stain PENDING   Incomplete    Culture  Final    Value: NO ANAEROBES ISOLATED; CULTURE IN PROGRESS FOR 5 DAYS   Report Status PENDING   Incomplete   TISSUE CULTURE     Status: Normal (Preliminary result)   Collection Time   11/05/11  5:17 PM      Component Value Range Status Comment   Specimen Description TISSUE TOE RIGHT   Final    Special Requests RIGHT 4TH TOE DEEP TISSUE PT ON ZOSYN VANCO   Final    Gram Stain     Final    Value: RARE WBC PRESENT,BOTH PMN AND MONONUCLEAR     RARE SQUAMOUS EPITHELIAL CELLS PRESENT     NO ORGANISMS SEEN   Culture NO GROWTH 2 DAYS   Final    Report Status PENDING   Incomplete     Studies/Results: No results found.   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  Await Cx.  Plan for 2 weeks of anbx post surgery.    [name] Infectious Diseases [id number] 11/08/2011, 2:09 PM

## 2011-11-08 NOTE — Progress Notes (Addendum)
ANTIBIOTIC CONSULT NOTE - FOLLOW UP  Pharmacy Consult for Vancomycin + Zosyn Indication: Cellulitis/diabetic foot ulcer, r/o osteo (now s/p amputation)  Allergies  Allergen Reactions  . Adhesive (Tape) Rash    Patient Measurements: Height: 6\' 2"  (188 cm) Weight: 261 lb 14.5 oz (118.8 kg) IBW/kg (Calculated) : 82.2   Vital Signs: Temp: 98.4 F (36.9 C) (02/28 1321) Temp src: Oral (02/28 1321) BP: 105/60 mmHg (02/28 1321) Pulse Rate: 52  (02/28 1321) Intake/Output from previous day: 02/27 0701 - 02/28 0700 In: 2607.5 [P.O.:720; I.V.:1387.5; IV Piggyback:500] Out: -  Intake/Output from this shift: Total I/O In: 360 [P.O.:360] Out: -   Labs:  Basename 11/08/11 0615 11/07/11 0600 11/06/11 0535  WBC -- -- --  HGB -- -- --  PLT -- -- --  LABCREA -- -- --  CREATININE 1.06 1.03 0.76   Estimated Creatinine Clearance: 105.3 ml/min (by C-G formula based on Cr of 1.06).  Basename 11/08/11 1326  VANCOTROUGH 15.9  VANCOPEAK --  VANCORANDOM --  GENTTROUGH --  GENTPEAK --  GENTRANDOM --  TOBRATROUGH --  TOBRAPEAK --  TOBRARND --  AMIKACINPEAK --  AMIKACINTROU --  AMIKACIN --     Microbiology: Recent Results (from the past 720 hour(s))  ANAEROBIC CULTURE     Status: Normal (Preliminary result)   Collection Time   11/05/11  5:17 PM      Component Value Range Status Comment   Specimen Description TISSUE TOE RIGHT   Final    Special Requests RIGHT 4TH TOE DEEP TISSUE PT ON ZOSYN VANCO   Final    Gram Stain PENDING   Incomplete    Culture     Final    Value: NO ANAEROBES ISOLATED; CULTURE IN PROGRESS FOR 5 DAYS   Report Status PENDING   Incomplete   TISSUE CULTURE     Status: Normal (Preliminary result)   Collection Time   11/05/11  5:17 PM      Component Value Range Status Comment   Specimen Description TISSUE TOE RIGHT   Final    Special Requests RIGHT 4TH TOE DEEP TISSUE PT ON ZOSYN VANCO   Final    Gram Stain     Final    Value: RARE WBC PRESENT,BOTH PMN AND  MONONUCLEAR     RARE SQUAMOUS EPITHELIAL CELLS PRESENT     NO ORGANISMS SEEN   Culture NO GROWTH 2 DAYS   Final    Report Status PENDING   Incomplete     Anti-infectives     Start     Dose/Rate Route Frequency Ordered Stop   11/01/11 0600   vancomycin (VANCOCIN) IVPB 1000 mg/200 mL premix        1,000 mg 200 mL/hr over 60 Minutes Intravenous Every 8 hours 11/01/11 0330     11/01/11 0600  piperacillin-tazobactam (ZOSYN) IVPB 3.375 g       3.375 g 12.5 mL/hr over 240 Minutes Intravenous 3 times per day 11/01/11 0330     10/31/11 2245   vancomycin (VANCOCIN) IVPB 1000 mg/200 mL premix        1,000 mg 200 mL/hr over 60 Minutes Intravenous  Once 10/31/11 2231 11/01/11 0008   10/31/11 2245  piperacillin-tazobactam (ZOSYN) IVPB 3.375 g       3.375 g 12.5 mL/hr over 240 Minutes Intravenous  Once 10/31/11 2231 11/01/11 0308          Assessment: 56 y.o. M on Vancomycin/Zosyn for empiric coverage of diabetic foot ulcer and ?R toe osteomyelitis  now s/p amputations of 4th/5th R toes. The patient failed treatment with keflex + doxy as an outpatient. Last Vancomycin trough was at goal for cellulitis however SCr has bumped up  ~35% since then so level was rechecked. Vanc trough now 15.9 and slightly supratherapeutic for cellulitis treatment. Per ID plan to continue antibiotics for 2 weeks post surgery on 2/25. Will go ahead and adjust dose at this time and monitor trend in renal function.   Goal of Therapy:  Vancomycin trough level 10-15 mcg/ml   Plan:  1. Reduce Vancomycin to 750 mg IV every 8 hours 2. Continue Zosyn 3.375g IV every 8 hours 3. Will continue to follow renal function, culture results, LOT, and antibiotic de-escalation plans   Alycia Rossetti, PharmD, BCPS Clinical Pharmacist Pager: [phone] 11/08/2011 3:27 PM

## 2011-11-08 NOTE — Progress Notes (Signed)
Subjective: Pain controlled.   Antibiotics: Vanc: 2.21.2013 >> Zosyn: 2.21.2013 >>  Objective: Filed Vitals:   11/07/11 1834 11/07/11 2100 11/08/11 0500 11/08/11 0901  BP: 142/70 145/67 148/70 100/58  Pulse: 69 68 56 56  Temp: 97.6 F (36.4 C) 98.3 F (36.8 C) 98 F (36.7 C) 98.6 F (37 C)  TempSrc: Oral Oral Oral Oral  Resp: 18 18 18 21   Height:      Weight:  118.8 kg (261 lb 14.5 oz)    SpO2: 94% 93% 97% 92%   Weight change: 2.498 kg (5 lb 8.1 oz)  Intake/Output Summary (Last 24 hours) at 11/08/11 0948 Last data filed at 11/08/11 0902  Gross per 24 hour  Intake 2727.5 ml  Output      0 ml  Net 2727.5 ml    General: Alert, awake, oriented x3, in no acute distress.  HEENT: No bruits, no goiter.  Heart: Regular rate and rhythm, without murmurs, rubs, gallops.  Lungs: Crackles left side, bilateral air movement.  Abdomen: Soft, nontender, nondistended, positive bowel sounds.  Neuro: Grossly intact, nonfocal. Ext: right lower extremity swollen not tender.   Lab Results:  Basename 11/08/11 0615 11/07/11 0600 11/06/11 0535  NA 136 134* 135  K 5.0 4.0 3.7  CL 99 97 98  CO2 30 29 30   GLUCOSE 85 73 84  BUN 15 13 10   CREATININE 1.06 1.03 0.76  CALCIUM 9.4 9.4 9.4  MG -- -- --  PHOS -- -- --   No results found for this basename: AST:2,ALT:2,ALKPHOS:2,BILITOT:2,PROT:2,ALBUMIN:2 in the last 72 hours No results found for this basename: LIPASE:2,AMYLASE:2 in the last 72 hours No results found for this basename: WBC:2,NEUTROABS:2,HGB:2,HCT:2,MCV:2,PLT:2 in the last 72 hours No results found for this basename: CKTOTAL:3,CKMB:3,CKMBINDEX:3,TROPONINI:3 in the last 72 hours No components found with this basename: POCBNP:3 No results found for this basename: DDIMER:2 in the last 72 hours No results found for this basename: HGBA1C:2 in the last 72 hours No results found for this basename: CHOL:2,HDL:2,LDLCALC:2,TRIG:2,CHOLHDL:2,LDLDIRECT:2 in the last 72 hours No results  found for this basename: TSH,T4TOTAL,FREET3,T3FREE,THYROIDAB in the last 72 hours No results found for this basename: VITAMINB12:2,FOLATE:2,FERRITIN:2,TIBC:2,IRON:2,RETICCTPCT:2 in the last 72 hours  Micro Results: Recent Results (from the past 240 hour(s))  ANAEROBIC CULTURE     Status: Normal (Preliminary result)   Collection Time   11/05/11  5:17 PM      Component Value Range Status Comment   Specimen Description TISSUE TOE RIGHT   Final    Special Requests RIGHT 4TH TOE DEEP TISSUE PT ON ZOSYN VANCO   Final    Gram Stain PENDING   Incomplete    Culture     Final    Value: NO ANAEROBES ISOLATED; CULTURE IN PROGRESS FOR 5 DAYS   Report Status PENDING   Incomplete   TISSUE CULTURE     Status: Normal (Preliminary result)   Collection Time   11/05/11  5:17 PM      Component Value Range Status Comment   Specimen Description TISSUE TOE RIGHT   Final    Special Requests RIGHT 4TH TOE DEEP TISSUE PT ON ZOSYN VANCO   Final    Gram Stain     Final    Value: RARE WBC PRESENT,BOTH PMN AND MONONUCLEAR     RARE SQUAMOUS EPITHELIAL CELLS PRESENT     NO ORGANISMS SEEN   Culture NO GROWTH 2 DAYS   Final    Report Status PENDING   Incomplete     Studies/Results:  No results found.  Medications: I have reviewed the patient's current medications.  Assessment and plan: Principal Problem: 1.Osteomyelitis S/p amputation. Cultures continue to be negative day 3  . Get ID consult for duration of antibiotics and follow up as an outpatient. -PICC in place. -home in am.  2. HTN (hypertension)  controlled, continue current treatment.  3.Cellulitis On broad spectrum antibiotics.   4.Diabetes mellitus Controlled.  5.Hyponatremia -resolved continue IV fluids. Monitor creatinine. -most likely due to decrease PO intake. -strict I and O's.  6. Hypokalemia Resolved.   7.Myofasciitis  resolved.     LOS: 8 days   Charlynne Cousins M.D. Pager: [phone] [facility] 11/08/2011, 9:48  AM

## 2011-11-08 NOTE — Progress Notes (Addendum)
Walked into room finding pt using an electronic cigarette. I informed him we were not supposed to use those in the hospital because we could not calculate the nicotine getting. Told pt that we could get order for nicotine patch. Pt states,"The doctor's and everyone else are aware and it is ok with them." Will make next shift aware.

## 2011-11-09 DIAGNOSIS — L97509 Non-pressure chronic ulcer of other part of unspecified foot with unspecified severity: Secondary | ICD-10-CM

## 2011-11-09 DIAGNOSIS — E1169 Type 2 diabetes mellitus with other specified complication: Secondary | ICD-10-CM

## 2011-11-09 LAB — TISSUE CULTURE: Culture: NO GROWTH

## 2011-11-09 LAB — GLUCOSE, CAPILLARY
Glucose-Capillary: 183 mg/dL — ABNORMAL HIGH (ref 70–99)
Glucose-Capillary: 143 mg/dL — ABNORMAL HIGH (ref 70–99)
Glucose-Capillary: 83 mg/dL (ref 70–99)
Glucose-Capillary: 154 mg/dL — ABNORMAL HIGH (ref 70–99)
Glucose-Capillary: 197 mg/dL — ABNORMAL HIGH (ref 70–99)

## 2011-11-09 MED ORDER — CIPROFLOXACIN HCL 750 MG PO TABS
750.0000 mg | ORAL_TABLET | Freq: Two times a day (BID) | ORAL | Status: DC
  Administered 2011-11-09 – 2011-11-11 (×4): 750 mg via ORAL
  Filled 2011-11-09 (×9): qty 1

## 2011-11-09 MED ORDER — FUROSEMIDE 10 MG/ML IJ SOLN
40.0000 mg | Freq: Two times a day (BID) | INTRAMUSCULAR | Status: DC
  Administered 2011-11-09 – 2011-11-11 (×4): 40 mg via INTRAVENOUS
  Filled 2011-11-09 (×6): qty 4

## 2011-11-09 NOTE — Progress Notes (Signed)
Subjective: Pain controlled.   Antibiotics: Vanc: 2.21.2013 >> Zosyn: 2.21.2013 >>  Cultures: No growth   Objective: Filed Vitals:   11/08/11 2119 11/08/11 2121 11/08/11 2124 11/09/11 0500  BP:  158/55 129/54 142/56  Pulse: 62   67  Temp: 98.8 F (37.1 C)   98 F (36.7 C)  TempSrc: Oral   Oral  Resp: 20   18  Height:      Weight:      SpO2: 95%   99%   Weight change:   Intake/Output Summary (Last 24 hours) at 11/09/11 0908 Last data filed at 11/09/11 0300  Gross per 24 hour  Intake 2426.67 ml  Output    225 ml  Net 2201.67 ml    General: Alert, awake, oriented x3, in no acute distress.  HEENT: No bruits, no goiter.  Heart: Regular rate and rhythm, without murmurs, rubs, gallops.  Lungs: Crackles left side, bilateral air movement.  Abdomen: Soft, nontender, nondistended, positive bowel sounds.  Neuro: Grossly intact, nonfocal. Ext: right lower extremity swollen not tender.   Lab Results:  Basename 11/08/11 0615 11/07/11 0600  NA 136 134*  K 5.0 4.0  CL 99 97  CO2 30 29  GLUCOSE 85 73  BUN 15 13  CREATININE 1.06 1.03  CALCIUM 9.4 9.4  MG -- --  PHOS -- --   No results found for this basename: AST:2,ALT:2,ALKPHOS:2,BILITOT:2,PROT:2,ALBUMIN:2 in the last 72 hours No results found for this basename: LIPASE:2,AMYLASE:2 in the last 72 hours No results found for this basename: WBC:2,NEUTROABS:2,HGB:2,HCT:2,MCV:2,PLT:2 in the last 72 hours No results found for this basename: CKTOTAL:3,CKMB:3,CKMBINDEX:3,TROPONINI:3 in the last 72 hours No components found with this basename: POCBNP:3 No results found for this basename: DDIMER:2 in the last 72 hours No results found for this basename: HGBA1C:2 in the last 72 hours No results found for this basename: CHOL:2,HDL:2,LDLCALC:2,TRIG:2,CHOLHDL:2,LDLDIRECT:2 in the last 72 hours No results found for this basename: TSH,T4TOTAL,FREET3,T3FREE,THYROIDAB in the last 72 hours No results found for this basename:  VITAMINB12:2,FOLATE:2,FERRITIN:2,TIBC:2,IRON:2,RETICCTPCT:2 in the last 72 hours  Micro Results: Recent Results (from the past 240 hour(s))  ANAEROBIC CULTURE     Status: Normal (Preliminary result)   Collection Time   11/05/11  5:17 PM      Component Value Range Status Comment   Specimen Description TISSUE TOE RIGHT   Final    Special Requests RIGHT 4TH TOE DEEP TISSUE PT ON ZOSYN VANCO   Final    Gram Stain PENDING   Incomplete    Culture     Final    Value: NO ANAEROBES ISOLATED; CULTURE IN PROGRESS FOR 5 DAYS   Report Status PENDING   Incomplete   TISSUE CULTURE     Status: Normal   Collection Time   11/05/11  5:17 PM      Component Value Range Status Comment   Specimen Description TISSUE TOE RIGHT   Final    Special Requests RIGHT 4TH TOE DEEP TISSUE PT ON ZOSYN VANCO   Final    Gram Stain     Final    Value: RARE WBC PRESENT,BOTH PMN AND MONONUCLEAR     RARE SQUAMOUS EPITHELIAL CELLS PRESENT     NO ORGANISMS SEEN   Culture NO GROWTH 3 DAYS   Final    Report Status 11/09/2011 FINAL   Final     Studies/Results: No results found.  Medications: I have reviewed the patient's current medications.  Assessment and plan: Principal Problem:  1.Osteomyelitis -S/p amputation. Cultures continue to be  negative day 4 . Which antibiotics can he be send home if cultures negative.. -PICC in place.  2. HTN (hypertension)  controlled, continue current treatment.  3.Cellulitis On broad spectrum antibiotics.   4.Diabetes mellitus Controlled.  5.Hyponatremia -resolved continue IV fluids. Monitor creatinine. -most likely due to decrease PO intake. -strict I and O's.  6. Hypokalemia Resolved.   7.Myofasciitis  resolved.     LOS: 9 days   Charlynne Cousins M.D. Pager: [phone] [facility] 11/09/2011, 9:08 AM

## 2011-11-09 NOTE — Progress Notes (Signed)
INFECTIOUS DISEASE PROGRESS NOTE  ID: [name] is a 56 y.o. male with osteomyelitis s/p amputation of 4th and 5th ray right foot on empiric antibiotics.    Subjective: He feels well, no rashes, diarrhea, n/v.  Abtx:  Anti-infectives     Start     Dose/Rate Route Frequency Ordered Stop   11/09/11 0000   vancomycin (VANCOCIN) 750 mg in sodium chloride 0.9 % 150 mL IVPB        750 mg 150 mL/hr over 60 Minutes Intravenous Every 8 hours 11/08/11 1538     11/01/11 0600   vancomycin (VANCOCIN) IVPB 1000 mg/200 mL premix  Status:  Discontinued        1,000 mg 200 mL/hr over 60 Minutes Intravenous Every 8 hours 11/01/11 0330 11/08/11 1538   11/01/11 0600  piperacillin-tazobactam (ZOSYN) IVPB 3.375 g       3.375 g 12.5 mL/hr over 240 Minutes Intravenous 3 times per day 11/01/11 0330     10/31/11 2245   vancomycin (VANCOCIN) IVPB 1000 mg/200 mL premix        1,000 mg 200 mL/hr over 60 Minutes Intravenous  Once 10/31/11 2231 11/01/11 0008   10/31/11 2245  piperacillin-tazobactam (ZOSYN) IVPB 3.375 g       3.375 g 12.5 mL/hr over 240 Minutes Intravenous  Once 10/31/11 2231 11/01/11 0308          Medications: I have reviewed the patient's current medications.  Objective: Vital signs in last 24 hours: Temp:  [97.7 F (36.5 C)-98.8 F (37.1 C)] 98 F (36.7 C) (03/01 1258) Pulse Rate:  [55-67] 64  (03/01 1258) Resp:  [18-21] 20  (03/01 1258) BP: (118-158)/(54-68) 126/59 mmHg (03/01 1258) SpO2:  [95 %-99 %] 99 % (03/01 1258) Weight:  [266 lb 9.6 oz (120.929 kg)] 266 lb 9.6 oz (120.929 kg) (03/01 0917)   General appearance: alert, cooperative and no distress Resp: clear to auscultation bilaterally Cardio: regular rate and rhythm, S1, S2 normal, no murmur, click, rub or gallop Extremities: s/p amputation  Lab Results  Basename 11/08/11 0615 11/07/11 0600  WBC -- --  HGB -- --  HCT -- --  NA 136 134*  K 5.0 4.0  CL 99 97  CO2 30 29  BUN 15 13  CREATININE 1.06 1.03    GLU -- --   Liver Panel No results found for this basename: PROT:2,ALBUMIN:2,AST:2,ALT:2,ALKPHOS:2,BILITOT:2,BILIDIR:2,IBILI:2 in the last 72 hours Sedimentation Rate No results found for this basename: ESRSEDRATE in the last 72 hours C-Reactive Protein No results found for this basename: CRP:2 in the last 72 hours  Microbiology: Recent Results (from the past 240 hour(s))  ANAEROBIC CULTURE     Status: Normal (Preliminary result)   Collection Time   11/05/11  5:17 PM      Component Value Range Status Comment   Specimen Description TISSUE TOE RIGHT   Final    Special Requests RIGHT 4TH TOE DEEP TISSUE PT ON ZOSYN VANCO   Final    Gram Stain PENDING   Incomplete    Culture     Final    Value: NO ANAEROBES ISOLATED; CULTURE IN PROGRESS FOR 5 DAYS   Report Status PENDING   Incomplete   TISSUE CULTURE     Status: Normal   Collection Time   11/05/11  5:17 PM      Component Value Range Status Comment   Specimen Description TISSUE TOE RIGHT   Final    Special Requests RIGHT 4TH TOE DEEP TISSUE  PT ON ZOSYN VANCO   Final    Gram Stain     Final    Value: RARE WBC PRESENT,BOTH PMN AND MONONUCLEAR     RARE SQUAMOUS EPITHELIAL CELLS PRESENT     NO ORGANISMS SEEN   Culture NO GROWTH 3 DAYS   Final    Report Status 11/09/2011 FINAL   Final     Studies/Results: No results found.   Assessment/Plan: 1) osteomyelitis - pathology shows margins are grossly clear so infected bone removed.  Two weeks total of IV vancomycin and po cipro will be adequate.  I have stopped the zosyn and started 750 mg cipro bid and the vancomycin per protocol with trough goal of 15-20.    [facility] 11/09/2011, 2:28 PM

## 2011-11-09 NOTE — Progress Notes (Addendum)
Subjective: 4 Days Post-Op Procedure(s) (LRB): AMPUTATION RAY (Right) Patient reports pain as mild.  No n/v/f/c.  Asking about swelling he notes in hands, abdomen and perineum.  Objective: Vital signs in last 24 hours: Temp:  [98 F (36.7 C)-98.8 F (37.1 C)] 98 F (36.7 C) (03/01 0500) Pulse Rate:  [52-67] 67  (03/01 0500) Resp:  [18-21] 18  (03/01 0500) BP: (100-158)/(54-68) 142/56 mmHg (03/01 0500) SpO2:  [90 %-99 %] 99 % (03/01 0500)  Intake/Output from previous day: 02/28 0701 - 03/01 0700 In: 3153.3 [P.O.:720; I.V.:1983.3; IV Piggyback:450] Out: 225 [Urine:225] Intake/Output this shift:    No results found for this basename: HGB:5 in the last 72 hours No results found for this basename: WBC:2,RBC:2,HCT:2,PLT:2 in the last 72 hours  Basename 11/08/11 0615 11/07/11 0600  NA 136 134*  K 5.0 4.0  CL 99 97  CO2 30 29  BUN 15 13  CREATININE 1.06 1.03  GLUCOSE 85 73  CALCIUM 9.4 9.4   No results found for this basename: LABPT:2,INR:2 in the last 72 hours  foot wound healing without signs of infxn.  new dressing applied.  cxs neg to date.  Assessment/Plan: 4 Days Post-Op Procedure(s) (LRB): AMPUTATION RAY (Right) Cxs likely to be final today.  Per Dr. Johnnye Sima, plan IV abx for 2 weeks post op.  Dry dressing to foot daily and as needed.  Follow up with me in two weeks in clinic.  No indication for anticoagulation IMO.  I'll sign off.  Please call with any questions.  [phone].  Wylene Simmer 11/09/2011, 7:36 AM

## 2011-11-09 NOTE — Progress Notes (Signed)
Pt concerned about his swelling and weight gain. When I compared his weight today to his weight upon admission, it shows he has gained 41lbs. I informed Dr. Aileen Fass of this change.

## 2011-11-10 LAB — BASIC METABOLIC PANEL
BUN: 13 mg/dL (ref 6–23)
CO2: 29 mEq/L (ref 19–32)
Calcium: 8.8 mg/dL (ref 8.4–10.5)
Chloride: 99 mEq/L (ref 96–112)
Creatinine, Ser: 0.84 mg/dL (ref 0.50–1.35)
GFR calc Af Amer: >90 mL/min (ref >90)
GFR calc non Af Amer: >90 mL/min (ref >90)
Glucose, Bld: 142 mg/dL — ABNORMAL HIGH (ref 70–99)
Potassium: 4 mEq/L (ref 3.5–5.1)
Sodium: 137 mEq/L (ref 135–145)

## 2011-11-10 LAB — GLUCOSE, CAPILLARY
Glucose-Capillary: 112 mg/dL — ABNORMAL HIGH (ref 70–99)
Glucose-Capillary: 126 mg/dL — ABNORMAL HIGH (ref 70–99)
Glucose-Capillary: 198 mg/dL — ABNORMAL HIGH (ref 70–99)
Glucose-Capillary: 100 mg/dL — ABNORMAL HIGH (ref 70–99)
Glucose-Capillary: 199 mg/dL — ABNORMAL HIGH (ref 70–99)
Glucose-Capillary: 174 mg/dL — ABNORMAL HIGH (ref 70–99)

## 2011-11-10 MED ORDER — VANCOMYCIN HCL IN DEXTROSE 1-5 GM/200ML-% IV SOLN
1000.0000 mg | Freq: Three times a day (TID) | INTRAVENOUS | Status: DC
  Administered 2011-11-10 – 2011-11-11 (×4): 1000 mg via INTRAVENOUS
  Filled 2011-11-10 (×6): qty 200

## 2011-11-10 MED ORDER — VANCOMYCIN HCL IN DEXTROSE 1-5 GM/200ML-% IV SOLN: 1000.0000 mg | Freq: Three times a day (TID) | INTRAVENOUS | Status: DC

## 2011-11-10 MED ORDER — FUROSEMIDE 40 MG PO TABS: 40.0000 mg | ORAL_TABLET | Freq: Two times a day (BID) | ORAL | Status: DC

## 2011-11-10 MED ORDER — CIPROFLOXACIN HCL 750 MG PO TABS: 750.0000 mg | ORAL_TABLET | Freq: Two times a day (BID) | ORAL | Status: DC

## 2011-11-10 NOTE — Discharge Summary (Signed)
Admit date: 10/31/2011 Discharge date: 11/10/2011  Primary Care Physician:  [name], MD, MD   Discharge Diagnoses:   Active Hospital Problems  Diagnoses Date Noted   . Diabetes mellitus 11/01/2011   . HTN (hypertension) 02/01/2011     Resolved Hospital Problems  Diagnoses Date Noted Date Resolved  . Osteomyelitis 11/01/2011 11/10/2011  . Myofasciitis 11/07/2011 11/10/2011  . Cellulitis 11/01/2011 11/10/2011  . Hyponatremia 11/01/2011 11/10/2011  . Hypokalemia 11/01/2011 11/10/2011     DISCHARGE MEDICATION: Medication List  As of 11/10/2011  9:34 AM   STOP taking these medications         cephALEXin 500 MG capsule      doxycycline 100 MG capsule      metoCLOPramide 5 MG tablet         TAKE these medications         ALPRAZolam 0.5 MG tablet   Commonly known as: XANAX   Take 0.25 mg by mouth 2 (two) times daily.      amLODipine-valsartan 10-320 MG per tablet   Commonly known as: EXFORGE   Take 1 tablet by mouth daily.      aspirin 81 MG EC tablet   Take 81 mg by mouth 2 (two) times daily.      carvedilol 12.5 MG tablet   Commonly known as: COREG   Take 25 mg by mouth 2 (two) times daily with a meal.      ciprofloxacin 750 MG tablet   Commonly known as: CIPRO   Take 1 tablet (750 mg total) by mouth 2 (two) times daily.      FLUoxetine 20 MG capsule   Commonly known as: PROZAC   Take 20 mg by mouth daily.      furosemide 40 MG tablet   Commonly known as: LASIX   Take 1 tablet (40 mg total) by mouth 2 (two) times daily.      glyBURIDE-metformin 2.5-500 MG per tablet   Commonly known as: GLUCOVANCE   Take 2 tablets by mouth 2 (two) times daily with a meal.      insulin glargine 100 UNIT/ML injection   Commonly known as: LANTUS   Inject 50 Units into the skin 2 (two) times daily.      lansoprazole 30 MG capsule   Commonly known as: PREVACID   Take 30 mg by mouth 2 (two) times daily.      lidocaine 5 %   Commonly known as: LIDODERM   Place 1  patch onto the skin as needed. Remove & Discard patch within 12 hours or as directed by MD      omega-3 acid ethyl esters 1 G capsule   Commonly known as: LOVAZA   Take 4 g by mouth daily.      oxyCODONE-acetaminophen 5-500 MG per capsule   Commonly known as: TYLOX   Take 1 capsule by mouth every 4 (four) hours as needed. For pain      potassium chloride SA 20 MEQ tablet   Commonly known as: K-DUR,KLOR-CON   Take 20 mEq by mouth daily.      pregabalin 75 MG capsule   Commonly known as: LYRICA   Take 75 mg by mouth 2 (two) times daily.      rOPINIRole 2 MG tablet   Commonly known as: REQUIP   Take 2 mg by mouth at bedtime.      traMADol 50 MG tablet   Commonly known as: ULTRAM   Take 50 mg by mouth every  6 (six) hours as needed. For pain      TRILIPIX 135 MG capsule   Generic drug: Choline Fenofibrate   Take 135 mg by mouth daily.      vancomycin 1 GM/200ML Soln   Commonly known as: VANCOCIN   Inject 200 mLs (1,000 mg total) into the vein every 8 (eight) hours.              Consults:     SIGNIFICANT DIAGNOSTIC STUDIES:  Mr Foot Right Wo Contrast  11/02/2011  *RADIOLOGY REPORT*  Clinical Data: Diabetic with pain and swelling of the fourth toe. Question osteomyelitis.  History of great toe injury.  MRI OF THE RIGHT FOREFOOT WITHOUT CONTRAST  Technique:  Multiplanar, multisequence MR imaging was performed. No intravenous contrast was administered.  Comparison: Radiographs 10/31/2011.  Findings: There is extensive soft tissue edema throughout the forefoot, especially within the dorsal subcutaneous fat where there is ill-defined fluid.  There is edema throughout the forefoot musculature.  A small amount of fluid is present within the extensor tendons.  No focal fluid collections are identified. The fourth toe is particularly swollen.  There is diffuse marrow edema throughout the head of the fourth proximal phalanx and the base of the middle phalanx.  This manifests as  increased T2 and slightly decreased T1 signal.  Early cortical destruction involving the lateral head of the proximal phalanx is difficult to exclude on the T1-weighted images.  There is no significant interphalangeal joint effusion.  There are advanced arthropathic changes of the first metatarsal phalangeal joint with joint space loss and subchondral cyst formation.  Lesser midfoot degenerative changes are present with scattered subchondral cysts.  There is no subluxation at the Lisfranc joint.  IMPRESSION:  1.  Diffuse forefoot cellulitis and myofasciitis.  No focal fluid collection demonstrated. 2.  Findings are suspicious for osteomyelitis of the fourth toe surrounding the proximal interphalangeal joint. 3.  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  Original Report Authenticated By: Vivia Ewing, M.D.   Dg Foot Complete Right  10/31/2011  *RADIOLOGY REPORT*  Clinical Data: Right fourth toe pain, swelling and ulceration. Diabetes.  RIGHT FOOT COMPLETE - 3+ VIEW  Comparison: 10/05/2011.  Findings: Diffuse soft tissue swelling involving the fourth toe and distal flow.  No bone destruction or periosteal reaction.  No soft tissue gas.  Joint space narrowing, bony hypertrophy and cystic changes on both sides of the first MTP joint.  Calcaneal spurs.  IMPRESSION:  1.  Right fourth toe soft tissue swelling without evidence of underlying osteomyelitis. 2.  Extensive first MTP joint degenerative changes.  Original Report Authenticated By: Gerald Stabs, M.D.         CARDIAC CATH & OTHER PROCEDURES: AMPUTATION RAY of Right 4th toe secondarily to osteomyelitis    Recent Results (from the past 240 hour(s))  ANAEROBIC CULTURE     Status: Normal (Preliminary result)   Collection Time   11/05/11  5:17 PM      Component Value Range Status Comment   Specimen Description TISSUE TOE RIGHT   Final    Special Requests RIGHT  4TH TOE DEEP TISSUE PT ON ZOSYN VANCO   Final    Gram Stain PENDING   Incomplete    Culture     Final    Value: NO ANAEROBES ISOLATED; CULTURE IN PROGRESS FOR 5 DAYS   Report Status PENDING   Incomplete   TISSUE CULTURE  Status: Normal   Collection Time   11/05/11  5:17 PM      Component Value Range Status Comment   Specimen Description TISSUE TOE RIGHT   Final    Special Requests RIGHT 4TH TOE DEEP TISSUE PT ON ZOSYN VANCO   Final    Gram Stain     Final    Value: RARE WBC PRESENT,BOTH PMN AND MONONUCLEAR     RARE SQUAMOUS EPITHELIAL CELLS PRESENT     NO ORGANISMS SEEN   Culture NO GROWTH 3 DAYS   Final    Report Status 11/09/2011 FINAL   Final     BRIEF ADMITTING H & P: 83yoM with h/o DM/HTN, COPD/heavy smoker, PAD s/p left CEA 11/2010 and atherectomy / stenting left popliteal artery 03/2011, 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  Pt states that he has baseline peripheral neuropathic type pain, however about a month ago he started getting worsened R foot pain and eventually swelling of his lateral-most two toes. He eventually noted an ulceration of the 4th digit in between the fourth and fifth, for which he saw PCP Dr. Wilson Singer, who did an x-ray and started him on Keflex and Doxycycline which the pt has been taking for the past month, without much improvement. Eventually the toes have been getting more swollen and also with a dark purplish, reddish hue, and increased pain. He notes whitish discharge and also blood on the gauze that he has been putting between his toes. He denies any frank fevers, chills, sweats. Because he kept having discharge from the toe, he came  to the ED.  In the ED pt was hypertense to max 229/85, now stabilized, otherwise normal. Labs  with hypoNa 130, hypoK 3.4, normal renal and hyperglycemia to >300. WBC 9.4, Hct  38.5. Plain film with right fourth toe soft tissue swelling, no  evidence of  osteomyelitis, and 1st MTP degenerative changes. Pt was given morphine x2, zofran x2,  zosyn and vancomycin, and 1L NS.   Active Hospital Problems  Diagnoses Date Noted   . Diabetes mellitus: His metformin was stopped. For procedures. He was continue on his home dose of lantus and SSI was added. He remained fairly well controlled in the hospital. His metformin was resumed after procedure. No change were made to his regimen. 11/01/2011   . HTN (hypertension): Lasix was added to his regimen he will follow up with his PCP in 2 weeks. Will check a b-met in 2 weeks. 02/01/2011     Resolved Hospital Problems  Diagnoses Date Noted Date Resolved  . Osteomyelitis: X-ray and MRI if the foot were done with result as above orthopedics were consulted who recommended amputation and ID consult. 4th toe amputation with no complications. Cultures showed no growth. Id recommended vanc IV and Ciprofloxacin PO for 2 weeks. WIll follow up with orthopedic MD. 11/01/2011 11/10/2011  . Myofasciitis: Required surgical debridement which was perform during amputation. 11/07/2011 11/10/2011  . Cellulitis: He was started empirically on vanc and zosyn. Doppler of his lower extremity was done showed no DVTHis redness improved significantly. Blood cultures were negative. Antibiotics coverage for amputation should cover. 11/01/2011 11/10/2011  . Hyponatremia: Most likely secondarily to decrease intravascular volume this resolved with IV fluids. 11/01/2011 11/10/2011  . Hypokalemia; repleted resolved.  11/01/2011 11/10/2011     Disposition and Follow-up:  Discharge Orders    Future Appointments: Provider: Department: Dept Phone: Center:   12/11/2011 2:30 PM  [facility] [phone] [facility]     Future Orders Please Complete By Expires   Diet - low sodium heart healthy      Increase activity slowly        Follow-up Information    Follow up with [name], MD in 10 days.       Follow up with Wylene Simmer, MD in 2 weeks.   Contact information:   [address] [phone]           DISCHARGE EXAM:  General: Alert, awake, oriented x3, in no acute distress.  HEENT: No bruits, no goiter.  Heart: Regular rate and rhythm, without murmurs, rubs, gallops.  Lungs: Crackles left side, bilateral air movement.  Abdomen: Soft, nontender, nondistended, positive bowel sounds.  Neuro: Grossly intact, nonfocal.  Ext: bilateral lower extremity edema not tender.   Blood pressure 156/62, pulse 76, temperature 98.4 F (36.9 C), temperature source Oral, resp. rate 19, height 6\' 2"  (1.88 m), weight 120.929 kg (266 lb 9.6 oz), SpO2 95.00%.   Basename 11/10/11 0445 11/08/11 0615  NA 137 136  K 4.0 5.0  CL 99 99  CO2 29 30  GLUCOSE 142* 85  BUN 13 15  CREATININE 0.84 1.06  CALCIUM 8.8 9.4  MG -- --  PHOS -- --   No results found for this basename: AST:2,ALT:2,ALKPHOS:2,BILITOT:2,PROT:2,ALBUMIN:2 in the last 72 hours No results found for this basename: LIPASE:2,AMYLASE:2 in the last 72 hours No results found for this basename: WBC:2,NEUTROABS:2,HGB:2,HCT:2,MCV:2,PLT:2 in the last 72 hours  Signed: Charlynne Cousins M.D. 11/10/2011, 9:34 AM

## 2011-11-10 NOTE — Progress Notes (Signed)
   CARE MANAGEMENT NOTE 11/10/2011  Patient:  [name], [name]   Account Number:  [id number]  Date Initiated:  11/10/2011  Documentation initiated by:  [facility]  Subjective/Objective Assessment:     Action/Plan:   Anticipated DC Date:  11/19/2011   Anticipated DC Plan:  [place]  CM consult      [facility] Choice  HOME HEALTH   Choice offered to / List presented to:  C-1 Patient        [place] arranged  HH-1 RN      [place].   Status of service:  In process, will continue to follow Medicare Important Message given?   (If response is "NO", the following Medicare IM given date fields will be blank) Date Medicare IM given:   Date Additional Medicare IM given:    Discharge Disposition:    Per UR Regulation:    Comments:  11/09/2101 1600 [facility] can provided IV abx for home if NCM can locate agency to administer. Contacted [facility], [facility], [place], [facility] and [facility]. Agencies unable to service for d/c this weekend. Will contact [place] to schedule services for Monday. Made pt aware and Unit RN was going to notify MD.  Jonnie Finner RN CCM Case Mgmt phone [phone]

## 2011-11-10 NOTE — Progress Notes (Signed)
ANTIBIOTIC CONSULT NOTE - FOLLOW UP  Pharmacy Consult for Vancomycin + Zosyn Indication: Cellulitis/diabetic foot ulcer, osteomyelitis (s/p amputation)  Assessment: 56 y.o. male on Vancomycin D#10 & Cipro PO D#2 for empiric coverage of diabetic foot ulcer and R toe osteomyelitis now s/p amputations of 4th/5th R toes.  Per ID recs, 2 more weeks of antibiotic tx should be sufficient since margins appear clean. The patient failed treatment with keflex & doxycycline as an outpatient. Last vancomycin trough was therapeutic at 15.9 (2/28), but dose was adjusted for SCr increase of ~35%. Since SCr has returned to near baseline, we will adjust dose back up and monitor to confirm trend in renal function. Patient remains afebrile.  Pharmacy System-Based Medication Review: PMH: DM, HTN, HLD, COPD, DJD, GERD, PAD, Barrett's esophagus, fatty liver, diverticulosis, OSA, emphysema Anticoagulation: None PTA, SCDs for VTE px Cardiovascular: Hx HTN/PAD/DL. s/p stent to L popliteal artery, BP/24h: 118-148/57, HR: 57-81. norvasc, ASA, coreg, irbesartan, lasix Endocrinology: Hx DM. A1c 9.4. CBG/24h: 83-197. On lantus 50 units bid, SSI Gastrointestinal / Nutrition: hx GERD on PPI - CHO diet Neurology: s/p left CEA, on home fluoxetine & Lyrica Nephrology: Lytes ok and stable, SCr near baseline Hematology / Oncology: CBC stable Best Practices: SCDs for VTE px, PPI resumed from PTA for GERD, home meds addressed Disposition: Hopefully soon, PICC line in place for outpatient abx  Goal of Therapy:  Vancomycin trough level 15-20 mcg/ml  Plan:  1. Increase vancomycin to 1000 mg IV every 8 hours 2. Continue Cipro 750mg  PO BID 3. Will continue to follow renal function & microbiology data  [name]. Marianna Fuss, PharmD Clinical Pharmacist, Pager: [phone]  11/10/2011 8:19 AM   Allergies  Allergen Reactions  . Adhesive (Tape) Rash    Patient Measurements: Height: 6\' 2"  (188 cm) Weight: 266 lb 9.6 oz (120.929  kg) IBW/kg (Calculated) : 82.2   Vital Signs: Temp: 98.4 F (36.9 C) (03/02 0542) Temp src: Oral (03/02 0542) BP: 148/57 mmHg (03/02 0542) Pulse Rate: 81  (03/02 0542) Intake/Output from previous day: 03/01 0701 - 03/02 0700 In: 2061.8 [P.O.:720; I.V.:885.8; IV Piggyback:456] Out: -  Intake/Output from this shift: Total I/O In: -  Out: 900 [Urine:900]  Labs:  [facility] 11/10/11 0445 11/08/11 0615  WBC -- --  HGB -- --  PLT -- --  LABCREA -- --  CREATININE 0.84 1.06   Estimated Creatinine Clearance: 134.1 ml/min (by C-G formula based on Cr of 0.84).  Basename 11/08/11 1326  VANCOTROUGH 15.9  VANCOPEAK --  VANCORANDOM --  [place] --  AMIKACIN --     Microbiology: Recent Results (from the past 720 hour(s))  ANAEROBIC CULTURE     Status: Normal (Preliminary result)   Collection Time   11/05/11  5:17 PM      Component Value Range Status Comment   Specimen Description TISSUE TOE RIGHT   Final    Special Requests RIGHT 4TH TOE DEEP TISSUE PT ON ZOSYN VANCO   Final    Gram Stain PENDING   Incomplete    Culture     Final    Value: NO ANAEROBES ISOLATED; CULTURE IN PROGRESS FOR 5 DAYS   Report Status PENDING   Incomplete   TISSUE CULTURE     Status: Normal   Collection Time   11/05/11  5:17 PM      Component Value Range Status Comment  Specimen Description TISSUE TOE RIGHT   Final    Special Requests RIGHT 4TH TOE DEEP TISSUE PT ON ZOSYN VANCO   Final    Gram Stain     Final    Value: RARE WBC PRESENT,BOTH PMN AND MONONUCLEAR     RARE SQUAMOUS EPITHELIAL CELLS PRESENT     NO ORGANISMS SEEN   Culture NO GROWTH 3 DAYS   Final    Report Status 11/09/2011 FINAL   Final     Anti-infectives     Start     Dose/Rate Route Frequency Ordered Stop   11/09/11 2000   ciprofloxacin (CIPRO) tablet 750 mg        750 mg Oral 2 times daily 11/09/11 1431     11/09/11  0000   vancomycin (VANCOCIN) 750 mg in sodium chloride 0.9 % 150 mL IVPB        750 mg 150 mL/hr over 60 Minutes Intravenous Every 8 hours 11/08/11 1538     11/01/11 0600   vancomycin (VANCOCIN) IVPB 1000 mg/200 mL premix  Status:  Discontinued        1,000 mg 200 mL/hr over 60 Minutes Intravenous Every 8 hours 11/01/11 0330 11/08/11 1538   11/01/11 0600   piperacillin-tazobactam (ZOSYN) IVPB 3.375 g  Status:  Discontinued        3.375 g 12.5 mL/hr over 240 Minutes Intravenous 3 times per day 11/01/11 0330 11/09/11 1431   10/31/11 2245   vancomycin (VANCOCIN) IVPB 1000 mg/200 mL premix        1,000 mg 200 mL/hr over 60 Minutes Intravenous  Once 10/31/11 2231 11/01/11 0008   10/31/11 2245  piperacillin-tazobactam (ZOSYN) IVPB 3.375 g       3.375 g 12.5 mL/hr over 240 Minutes Intravenous  Once 10/31/11 2231 11/01/11 0308

## 2011-11-10 NOTE — Progress Notes (Signed)
[id number] Patient has order to cap picc line for discharge.

## 2011-11-11 LAB — ANAEROBIC CULTURE

## 2011-11-11 LAB — GLUCOSE, CAPILLARY
Glucose-Capillary: 122 mg/dL — ABNORMAL HIGH (ref 70–99)
Glucose-Capillary: 96 mg/dL (ref 70–99)
Glucose-Capillary: 153 mg/dL — ABNORMAL HIGH (ref 70–99)

## 2011-11-11 MED ORDER — HEPARIN SOD (PORK) LOCK FLUSH 100 UNIT/ML IV SOLN
250.0000 [IU] | INTRAVENOUS | Status: AC | PRN
  Administered 2011-11-11: 250 [IU]

## 2011-11-11 MED ORDER — VANCOMYCIN HCL IN DEXTROSE 1-5 GM/200ML-% IV SOLN: 1000.0000 mg | Freq: Three times a day (TID) | INTRAVENOUS | Status: DC

## 2011-11-11 NOTE — Progress Notes (Signed)
Subjective: Pain controlled.   Antibiotics: Vanc: 2.21.2013 >> Zosyn: 2.21.2013 >>3.02.2013 Cipro: 3.02.2013>>  Cultures: No growth   Objective: Filed Vitals:   11/10/11 2101 11/11/11 0418 11/11/11 0453 11/11/11 0812  BP: 129/58 182/84 171/76 159/98  Pulse: 56 58 60 66  Temp: 98.6 F (37 C) 98.1 F (36.7 C) 98.1 F (36.7 C)   TempSrc: Oral Oral Oral   Resp: 20 20 20    Height:      Weight:      SpO2: 96% 100% 98%    Weight change:   Intake/Output Summary (Last 24 hours) at 11/11/11 0815 Last data filed at 11/11/11 0423  Gross per 24 hour  Intake   1800 ml  Output   1000 ml  Net    800 ml    General: Alert, awake, oriented x3, in no acute distress.  HEENT: No bruits, no goiter.  Heart: Regular rate and rhythm, without murmurs, rubs, gallops.  Lungs: Crackles left side, bilateral air movement.  Abdomen: Soft, nontender, nondistended, positive bowel sounds.  Neuro: Grossly intact, nonfocal. Ext: right lower extremity swollen not tender.   Lab Results:  [facility] 11/10/11 0445  NA 137  K 4.0  CL 99  CO2 29  GLUCOSE 142*  BUN 13  CREATININE 0.84  CALCIUM 8.8  MG --  PHOS --   No results found for this basename: AST:2,ALT:2,ALKPHOS:2,BILITOT:2,PROT:2,ALBUMIN:2 in the last 72 hours No results found for this basename: LIPASE:2,AMYLASE:2 in the last 72 hours No results found for this basename: WBC:2,NEUTROABS:2,HGB:2,HCT:2,MCV:2,PLT:2 in the last 72 hours No results found for this basename: CKTOTAL:3,CKMB:3,CKMBINDEX:3,TROPONINI:3 in the last 72 hours No components found with this basename: POCBNP:3 No results found for this basename: DDIMER:2 in the last 72 hours No results found for this basename: HGBA1C:2 in the last 72 hours No results found for this basename: CHOL:2,HDL:2,LDLCALC:2,TRIG:2,CHOLHDL:2,LDLDIRECT:2 in the last 72 hours No results found for this basename: TSH,T4TOTAL,FREET3,T3FREE,THYROIDAB in the last 72 hours No results found for this  basename: VITAMINB12:2,FOLATE:2,FERRITIN:2,TIBC:2,IRON:2,RETICCTPCT:2 in the last 72 hours  Micro Results: Recent Results (from the past 240 hour(s))  ANAEROBIC CULTURE     Status: Normal (Preliminary result)   Collection Time   11/05/11  5:17 PM      Component Value Range Status Comment   Specimen Description TISSUE TOE RIGHT   Final    Special Requests RIGHT 4TH TOE DEEP TISSUE PT ON ZOSYN VANCO   Final    Gram Stain PENDING   Incomplete    Culture     Final    Value: NO ANAEROBES ISOLATED; CULTURE IN PROGRESS FOR 5 DAYS   Report Status PENDING   Incomplete   TISSUE CULTURE     Status: Normal   Collection Time   11/05/11  5:17 PM      Component Value Range Status Comment   Specimen Description TISSUE TOE RIGHT   Final    Special Requests RIGHT 4TH TOE DEEP TISSUE PT ON ZOSYN VANCO   Final    Gram Stain     Final    Value: RARE WBC PRESENT,BOTH PMN AND MONONUCLEAR     RARE SQUAMOUS EPITHELIAL CELLS PRESENT     NO ORGANISMS SEEN   Culture NO GROWTH 3 DAYS   Final    Report Status 11/09/2011 FINAL   Final     Studies/Results: No results found.  Medications: I have reviewed the patient's current medications.  Assessment and plan: Principal Problem:  1.Osteomyelitis -S/p amputation. Cultures continue to be negative day 4 .  Which antibiotics can he be send home if cultures negative.. -PICC in place. Home on vanc and cipro 2 week. -did not leave as logistic were not in place yesterday hopefully today.  2. HTN (hypertension)  controlled, continue current treatment.  3.Cellulitis On broad spectrum antibiotics.   4.Diabetes mellitus Controlled.  5.Hyponatremia -resolved continue IV fluids. Monitor creatinine. -most likely due to decrease PO intake. -strict I and O's.  6. Hypokalemia Resolved.   7.Myofasciitis  resolved.     LOS: 11 days   Charlynne Cousins M.D. Pager: [phone] [facility] 11/11/2011, 8:15 AM

## 2011-11-11 NOTE — Progress Notes (Signed)
3.3.13. 1624. Nsg. Pt discharge  to home with PICC line per md order.

## 2011-11-11 NOTE — Progress Notes (Signed)
[id number]. Pt has been discharged and wife called up RN she said that the cipro 750 mg is not available in their pharmacy and they already called to other pharmacies but med is  Not available. Called up to case manager as to what they do if they have this kind of situation. Case manager called Dr. Olevia Bowens and he said it is ok to miss one dose and get the next dose tomorrow when med is available.RN called wife.

## 2011-11-11 NOTE — Progress Notes (Signed)
   CARE MANAGEMENT NOTE 11/11/2011  Patient:  LENNY, DITTER   Account Number:  [id number]  Date Initiated:  11/10/2011  Documentation initiated by:  [facility]  Subjective/Objective Assessment:   osteomyelitis, cellulitis     Action/Plan:   lives at home with wife   Anticipated DC Date:  11/19/2011   Anticipated DC Plan:  [place]  CM consult      [facility]  HOME [facility]   Choice offered to / List presented to:  C-1 Patient        [place] arranged  HH-1 RN      [place].   Status of service:  Completed, signed off Medicare Important Message given?   (If response is "NO", the following Medicare IM given date fields will be blank) Date Medicare IM given:   Date Additional Medicare IM given:    Discharge Disposition:  [place]  Per UR Regulation:    Comments:  11/11/11 1100 Spoke to [facility] and they can have RN go out tonight to start IV abx. Pt is agreeable to d/c today with [facility]. Jonnie Finner RN CCM Case Mgmt phone [phone]   11/09/2101 1600 [facility] can provided IV abx for home if NCM can locate agency to administer. Contacted [facility], [place], [place], [facility] and [place]. Agencies unable to service for d/c this weekend. Will contact [place] to schedule services for Monday. Made pt aware and Unit RN was going to notify MD.  Jonnie Finner RN CCM Case Mgmt phone [phone]

## 2011-11-20 ENCOUNTER — Ambulatory Visit (INDEPENDENT_AMBULATORY_CARE_PROVIDER_SITE_OTHER): Payer: BC Managed Care – HMO | Admitting: Infectious Disease

## 2011-11-20 ENCOUNTER — Encounter: Payer: Self-pay | Admitting: Infectious Disease

## 2011-11-20 VITALS — BP 183/85 | HR 70 | Temp 98.4°F | Ht 74.0 in | Wt 244.8 lb

## 2011-11-20 DIAGNOSIS — E119 Type 2 diabetes mellitus without complications: Secondary | ICD-10-CM

## 2011-11-20 DIAGNOSIS — M869 Osteomyelitis, unspecified: Secondary | ICD-10-CM | POA: Insufficient documentation

## 2011-11-20 LAB — CBC WITH DIFFERENTIAL/PLATELET
Basophils Absolute: 0.1 10*3/uL (ref 0.0–0.1)
Basophils Relative: 1 % (ref 0–1)
Eosinophils Absolute: 0.4 10*3/uL (ref 0.0–0.7)
Eosinophils Relative: 4 % (ref 0–5)
HCT: 37 % — ABNORMAL LOW (ref 39.0–52.0)
Hemoglobin: 12.6 g/dL — ABNORMAL LOW (ref 13.0–17.0)
Lymphocytes Relative: 19 % (ref 12–46)
Lymphs Abs: 1.7 10*3/uL (ref 0.7–4.0)
MCH: 29.3 pg (ref 26.0–34.0)
MCHC: 34.1 g/dL (ref 30.0–36.0)
MCV: 86 fL (ref 78.0–100.0)
Monocytes Absolute: 0.7 10*3/uL (ref 0.1–1.0)
Monocytes Relative: 8 % (ref 3–12)
Neutro Abs: 6.1 10*3/uL (ref 1.7–7.7)
Neutrophils Relative %: 68 % (ref 43–77)
Platelets: 303 10*3/uL (ref 150–400)
RBC: 4.3 MIL/uL (ref 4.22–5.81)
RDW: 13 % (ref 11.5–15.5)
WBC: 9.1 10*3/uL (ref 4.0–10.5)

## 2011-11-20 LAB — C-REACTIVE PROTEIN: CRP: 0.23 mg/dL (ref <0.60)

## 2011-11-20 MED ORDER — CIPROFLOXACIN HCL 750 MG PO TABS: 750.0000 mg | ORAL_TABLET | Freq: Two times a day (BID) | ORAL | Status: AC

## 2011-11-20 MED ORDER — DOXYCYCLINE HYCLATE 100 MG PO TABS: 100.0000 mg | ORAL_TABLET | Freq: Two times a day (BID) | ORAL | Status: AC

## 2011-11-20 NOTE — Assessment & Plan Note (Signed)
Critical to wound healing and critical that he have outstanding foot care

## 2011-11-20 NOTE — Progress Notes (Signed)
  Subjective:    Patient ID: [name], male    DOB: [birth year], 56 y.o.   MRN: [id number]  HPI  56 year old male with diabetes, osteomyelitis s/p amputation of 4th and 5th ray right foot on 11/05/11 by Dr. [name] rx with IV vancomycin and zosyn then changed by my partner Dr [name] to IV vancomycin and oral cipro.   Cultures never grew anything. Pt came to [facility] for followup today with concerns about pICC line developing erythema round the enterance site. His foot he feels is healing well and he is without fevers,chills nausea or systemic symptoms.   Review of Systems  Constitutional: Negative for fever, chills, diaphoresis, activity change, appetite change, fatigue and unexpected weight change.  HENT: Negative for congestion, sore throat, rhinorrhea, sneezing, trouble swallowing and sinus pressure.   Eyes: Negative for photophobia and visual disturbance.  Respiratory: Negative for cough, chest tightness, shortness of breath, wheezing and stridor.   Cardiovascular: Negative for chest pain, palpitations and leg swelling.  Gastrointestinal: Negative for nausea, vomiting, abdominal pain, diarrhea, constipation, blood in stool, abdominal distention and anal bleeding.  Genitourinary: Negative for dysuria, hematuria, flank pain and difficulty urinating.  Musculoskeletal: Negative for myalgias, back pain, joint swelling, arthralgias and gait problem.  Skin: Positive for wound. Negative for color change, pallor and rash.  Neurological: Negative for dizziness, tremors, weakness and light-headedness.  Hematological: Negative for adenopathy. Does not bruise/bleed easily.  Psychiatric/Behavioral: Negative for behavioral problems, confusion, sleep disturbance, dysphoric mood, decreased concentration and agitation.       Objective:   Physical Exam  Constitutional: He is oriented to person, place, and time. He appears well-developed and well-nourished. No distress.  HENT:  Head: Normocephalic and  atraumatic.  Mouth/Throat: Oropharynx is clear and moist. No oropharyngeal exudate.  Eyes: Conjunctivae and EOM are normal. Pupils are equal, round, and reactive to light. No scleral icterus.  Neck: Normal range of motion. Neck supple. No JVD present.  Cardiovascular: Normal rate, regular rhythm and normal heart sounds.  Exam reveals no gallop and no friction rub.   No murmur heard. Pulmonary/Chest: Effort normal and breath sounds normal. No respiratory distress. He has no wheezes. He has no rales. He exhibits no tenderness.  Abdominal: He exhibits no distension and no mass. There is no tenderness. There is no rebound and no guarding.  Musculoskeletal: He exhibits no edema and no tenderness.       Feet:  Lymphadenopathy:    He has no cervical adenopathy.  Neurological: He is alert and oriented to person, place, and time. He has normal reflexes. He exhibits normal muscle tone. Coordination normal.  Skin: Skin is warm and dry. He is not diaphoretic. There is erythema. No pallor.     Psychiatric: He has a normal mood and affect. His behavior is normal. Judgment and thought content normal.          Assessment & Plan:  Diabetes mellitus Critical to wound healing and critical that he have outstanding foot care  Osteomyelitis of toe of right foot I will DC the [facility] and continue him on oral cipro and oral doxy to complete total of 4 weeks of abx with fu with [place] in 6 weeks, will check esr, crp

## 2011-11-20 NOTE — Assessment & Plan Note (Signed)
I will DC the [facility] and continue him on oral cipro and oral doxy to complete total of 4 weeks of abx with fu with [place] in 6 weeks, will check esr, crp

## 2011-11-21 LAB — SEDIMENTATION RATE: Sed Rate: 13 mm/hr (ref 0–16)

## 2011-11-21 LAB — BASIC METABOLIC PANEL WITH GFR
BUN: 11 mg/dL (ref 6–23)
CO2: 26 mEq/L (ref 19–32)
Calcium: 8.9 mg/dL (ref 8.4–10.5)
Chloride: 98 mEq/L (ref 96–112)
Creat: 0.72 mg/dL (ref 0.50–1.35)
GFR, Est African American: >89 mL/min
GFR, Est Non African American: >89 mL/min
Glucose, Bld: 141 mg/dL — ABNORMAL HIGH (ref 70–99)
Potassium: 3.7 mEq/L (ref 3.5–5.3)
Sodium: 136 mEq/L (ref 135–145)

## 2011-11-23 ENCOUNTER — Inpatient Hospital Stay: Payer: BC Managed Care – HMO | Admitting: Infectious Disease

## 2011-12-11 ENCOUNTER — Other Ambulatory Visit (INDEPENDENT_AMBULATORY_CARE_PROVIDER_SITE_OTHER): Payer: BC Managed Care – HMO | Admitting: *Deleted

## 2011-12-11 DIAGNOSIS — I6529 Occlusion and stenosis of unspecified carotid artery: Secondary | ICD-10-CM

## 2011-12-11 DIAGNOSIS — Z48812 Encounter for surgical aftercare following surgery on the circulatory system: Secondary | ICD-10-CM

## 2011-12-18 ENCOUNTER — Other Ambulatory Visit: Payer: Self-pay | Admitting: *Deleted

## 2011-12-18 DIAGNOSIS — Z48812 Encounter for surgical aftercare following surgery on the circulatory system: Secondary | ICD-10-CM

## 2011-12-18 DIAGNOSIS — I6529 Occlusion and stenosis of unspecified carotid artery: Secondary | ICD-10-CM

## 2011-12-19 ENCOUNTER — Encounter: Payer: Self-pay | Admitting: Vascular Surgery

## 2011-12-19 NOTE — Procedures (Unsigned)
CAROTID DUPLEX EXAM  INDICATION:  Follow up carotid disease.  HISTORY: Diabetes:  Yes. Cardiac:  CABG, coronary artery disease. Hypertension:  Yes. Smoking:  Currently. Previous Surgery:  Left CEA 11/10/2010. CV History:  Currently asymptomatic. Amaurosis [place] No, Paresthesias No, Hemiparesis No                                      RIGHT             LEFT Brachial systolic pressure:         169               174 Brachial Doppler waveforms:         Normal            Normal Vertebral direction of flow:        Antegrade         Antegrade DUPLEX VELOCITIES (cm/sec) CCA peak systolic                   85                [id number] ECA peak systolic                   102               [id number] ICA peak systolic                   67                96 ICA end diastolic                   20                32 PLAQUE MORPHOLOGY:                  Heterogenous      Heterogenous PLAQUE AMOUNT:                      Minimal           Minimal PLAQUE LOCATION:                    CCA, ICA, ECA     CCA  IMPRESSION: 1. Right internal carotid artery velocities suggest 1-39% stenosis. 2. Left common carotid artery disease present. 3. Patent left carotid endarterectomy site with no evidence of re-     stenosis. 4. Antegrade vertebral arteries bilaterally.  ___________________________________________ Nelda Severe Kellie Simmering, M.D.  EM/MEDQ  D:  12/12/2011  T:  12/12/2011  Job:  [id number]

## 2012-01-10 ENCOUNTER — Ambulatory Visit: Payer: BC Managed Care – HMO | Admitting: Infectious Disease

## 2012-02-01 ENCOUNTER — Ambulatory Visit (INDEPENDENT_AMBULATORY_CARE_PROVIDER_SITE_OTHER): Payer: BC Managed Care – HMO | Admitting: Infectious Disease

## 2012-02-01 ENCOUNTER — Encounter: Payer: Self-pay | Admitting: Infectious Disease

## 2012-02-01 VITALS — BP 208/99 | HR 86 | Temp 98.8°F | Ht 74.0 in | Wt 252.0 lb

## 2012-02-01 DIAGNOSIS — I10 Essential (primary) hypertension: Secondary | ICD-10-CM

## 2012-02-01 DIAGNOSIS — B351 Tinea unguium: Secondary | ICD-10-CM | POA: Insufficient documentation

## 2012-02-01 DIAGNOSIS — M869 Osteomyelitis, unspecified: Secondary | ICD-10-CM

## 2012-02-01 LAB — CBC WITH DIFFERENTIAL/PLATELET
Basophils Absolute: 0.1 10*3/uL (ref 0.0–0.1)
Basophils Relative: 1 % (ref 0–1)
Eosinophils Absolute: 0.2 10*3/uL (ref 0.0–0.7)
Eosinophils Relative: 3 % (ref 0–5)
HCT: 35.3 % — ABNORMAL LOW (ref 39.0–52.0)
Hemoglobin: 12 g/dL — ABNORMAL LOW (ref 13.0–17.0)
Lymphocytes Relative: 31 % (ref 12–46)
Lymphs Abs: 1.7 10*3/uL (ref 0.7–4.0)
MCH: 29.3 pg (ref 26.0–34.0)
MCHC: 34 g/dL (ref 30.0–36.0)
MCV: 86.3 fL (ref 78.0–100.0)
Monocytes Absolute: 0.3 10*3/uL (ref 0.1–1.0)
Monocytes Relative: 6 % (ref 3–12)
Neutro Abs: 3.3 10*3/uL (ref 1.7–7.7)
Neutrophils Relative %: 59 % (ref 43–77)
Platelets: 224 10*3/uL (ref 150–400)
RBC: 4.09 MIL/uL — ABNORMAL LOW (ref 4.22–5.81)
RDW: 14.2 % (ref 11.5–15.5)
WBC: 5.6 10*3/uL (ref 4.0–10.5)

## 2012-02-01 LAB — COMPLETE METABOLIC PANEL WITH GFR
ALT: 14 U/L (ref 0–53)
AST: 13 U/L (ref 0–37)
Albumin: 4.2 g/dL (ref 3.5–5.2)
Alkaline Phosphatase: 104 U/L (ref 39–117)
BUN: 12 mg/dL (ref 6–23)
CO2: 25 mEq/L (ref 19–32)
Calcium: 9 mg/dL (ref 8.4–10.5)
Chloride: 101 mEq/L (ref 96–112)
Creat: 1 mg/dL (ref 0.50–1.35)
GFR, Est African American: >89 mL/min
GFR, Est Non African American: 83 mL/min
Glucose, Bld: 336 mg/dL — ABNORMAL HIGH (ref 70–99)
Potassium: 3.9 mEq/L (ref 3.5–5.3)
Sodium: 135 mEq/L (ref 135–145)
Total Bilirubin: 0.3 mg/dL (ref 0.3–1.2)
Total Protein: 6.1 g/dL (ref 6.0–8.3)

## 2012-02-01 LAB — SEDIMENTATION RATE: Sed Rate: 8 mm/hr (ref 0–16)

## 2012-02-01 LAB — C-REACTIVE PROTEIN: CRP: 0.17 mg/dL (ref <0.60)

## 2012-02-01 MED ORDER — TERBINAFINE HCL 250 MG PO TABS: 250.0000 mg | ORAL_TABLET | Freq: Every day | ORAL | Status: DC

## 2012-02-01 NOTE — Assessment & Plan Note (Signed)
Give him 3 months of oral lamisil

## 2012-02-01 NOTE — Assessment & Plan Note (Signed)
Recheck esr, crp today. Observe OFF antibiotics

## 2012-02-01 NOTE — Progress Notes (Signed)
  Data Review:  ROS  Objective:  Physical Exam  Assessment:

## 2012-02-01 NOTE — Assessment & Plan Note (Signed)
BP was 123456 systolic but pt had not taken his meds today. He was asymptomatic. STRONGLY ENCOURAGED HIM TO GET BACK ONTO HIS MEDS AND FU WITH PCP

## 2012-02-01 NOTE — Progress Notes (Signed)
  Subjective:    Patient ID: [name], male    DOB: 1956/06/06, 56 y.o.   MRN: [id number]  HPI  56 year old male with diabetes, osteomyelitis s/p amputation of 4th and 5th ray right foot on 11/05/11 by Dr. [name] rx with IV vancomycin and zosyn then changed by my partner Dr [name] to IV vancomycin and oral cipro. Cultures never grew anything. I brought him back and transitioned him to oral antibiotics (cipro and doxy) for total of 4 weeks postoperatively. He has done VERY well after having finished the antibiotics. He still has onychomycosis of both feet. He is without fevers, chills or nausea. He has chronic neuropathic pain along with pain in mid foot and ankle.    Review of Systems  Constitutional: Negative for fever, chills, diaphoresis, activity change, appetite change, fatigue and unexpected weight change.  HENT: Negative for congestion, sore throat, rhinorrhea, sneezing, trouble swallowing and sinus pressure.   Eyes: Negative for photophobia and visual disturbance.  Respiratory: Negative for cough, chest tightness, shortness of breath, wheezing and stridor.   Cardiovascular: Negative for chest pain, palpitations and leg swelling.  Gastrointestinal: Negative for nausea, vomiting, abdominal pain, diarrhea, constipation, blood in stool, abdominal distention and anal bleeding.  Genitourinary: Negative for dysuria, hematuria, flank pain and difficulty urinating.  Musculoskeletal: Negative for myalgias, back pain, joint swelling, arthralgias and gait problem.  Skin: Negative for color change, pallor, rash and wound.  Neurological: Negative for dizziness, tremors, weakness and light-headedness.  Hematological: Negative for adenopathy. Does not bruise/bleed easily.  Psychiatric/Behavioral: Negative for behavioral problems, confusion, sleep disturbance, dysphoric mood, decreased concentration and agitation.       Objective:   Physical Exam  Constitutional: He is oriented to person, place,  and time. He appears well-developed and well-nourished. No distress.  HENT:  Head: Normocephalic and atraumatic.  Mouth/Throat: Oropharynx is clear and moist. No oropharyngeal exudate.  Eyes: Conjunctivae and EOM are normal. Pupils are equal, round, and reactive to light. No scleral icterus.  Neck: Normal range of motion. Neck supple. No JVD present.  Cardiovascular: Normal rate, regular rhythm and normal heart sounds.  Exam reveals no gallop and no friction rub.   No murmur heard. Pulmonary/Chest: Effort normal and breath sounds normal. No respiratory distress. He has no wheezes. He has no rales. He exhibits no tenderness.  Abdominal: He exhibits no distension and no mass. There is no tenderness. There is no rebound and no guarding.  Musculoskeletal: He exhibits no edema and no tenderness.       Feet:  Lymphadenopathy:    He has no cervical adenopathy.  Neurological: He is alert and oriented to person, place, and time. He has normal reflexes. He exhibits normal muscle tone. Coordination normal.  Skin: Skin is warm and dry. He is not diaphoretic. No pallor.  Psychiatric: He has a normal mood and affect. His behavior is normal. Judgment and thought content normal.          Assessment & Plan:  Osteomyelitis of toe of right foot Recheck esr, crp today. Observe OFF antibiotics  Onychomycosis Give him 3 months of oral lamisil

## 2012-02-13 ENCOUNTER — Encounter: Payer: Self-pay | Admitting: Cardiovascular Disease

## 2012-02-26 ENCOUNTER — Ambulatory Visit (INDEPENDENT_AMBULATORY_CARE_PROVIDER_SITE_OTHER): Payer: BC Managed Care – HMO | Admitting: Cardiovascular Disease

## 2012-02-26 ENCOUNTER — Encounter: Payer: Self-pay | Admitting: Cardiovascular Disease

## 2012-02-26 VITALS — BP 153/85 | HR 66 | Ht 74.0 in | Wt 249.0 lb

## 2012-02-26 DIAGNOSIS — I739 Peripheral vascular disease, unspecified: Secondary | ICD-10-CM

## 2012-02-26 DIAGNOSIS — R42 Dizziness and giddiness: Secondary | ICD-10-CM | POA: Insufficient documentation

## 2012-02-26 NOTE — Assessment & Plan Note (Signed)
I do not think this is related to his carotid artery disease. He recently wore a monitor per Dr. Rollene Fare. He will call Dr. Rollene Fare to get results and f/u for his dizziness.

## 2012-02-26 NOTE — Patient Instructions (Addendum)
Your physician wants you to follow-up in: 6 months. You will receive a reminder letter in the mail two months in advance. If you don't receive a letter, please call our office to schedule the follow-up appointment.  Your physician has requested that you have a lower or upper extremity arterial duplex. This test is an ultrasound of the arteries in the legs or arms. It looks at arterial blood flow in the legs and arms. Allow one hour for Lower and Upper Arterial scans. There are no restrictions or special instructions

## 2012-02-26 NOTE — Assessment & Plan Note (Addendum)
He has known severe PAD with severe stenosis in left popliteal artery treated with [place] atherectomy in July 2012. He also is known to have severe stenosis in the left ATA and severe stenosis in the right ATA and PTA. He did reasonably well following the atherectomy but has recurrence of weakness and pain in both legs. He has been seen 10 months ago by Dr. Kellie Simmering in [facility]. No repeat ABI over last year. Will repeat full lower extremity arterial dopplers. If his ABI is not normal, will need repeat lower extremity angiography and possibly referral back to [facility]. I do not see a need to repeat carotid dopplers at this time as these were just done in April 2013 and showed stable carotid artery disease.

## 2012-02-26 NOTE — Progress Notes (Signed)
History of Present Illness: 56 yo WM with h/o PAD s/p left popliteal artery intervention, carotid artery disease s/p Left CEA per Dr. Kellie Simmering, DM, HTN, HL, COPD, DJD, GERD here for PV follow up.His cardiac issues are followed by Dr. Rollene Fare in [facility].   I saw him May 24th 2012 for new PV evaluation. He described pain in both legs with walking. ABI in primary care with ABI of 0.75 on the left and 1.18 on the right. He also described weakness in both legs with walking. NO rest pain or ulcerations. I arranged a distal aortogram at [facility] on February 21, 2011. He was found to have a severe 90% left popliteal artery stenosis. He was brought back on July 11th 2012 for an atherectomy of the left popliteal artery. He had very little symptomatic improvement after the atherectomy and sought a second opinion with Dr. Victorino Dike in [facility]. He had a good pulse in his left foot and it was felt that his PAD was stable.  He has had amputations of his 4th-5th toes in February 2013 for osteomyelitis.   He has not been seen in our office since August 2012. He has over the last few months began to experience weakness and heaviness in both legs with walking 50 feet. No rest pain or ulcerations. He has stopped smoking but has been using an e-cigarette. He also describes some dizziness when bending over. He had carotid artery dopplers 12/12/11 in the [facility] office per Dr. Kellie Simmering which showed stable left CEA site with no restenosis and mild Right ICA stenosis, less than 39%. No chest pain or SOB.    Primary Care Physician: Anda Kraft  Past Medical History  Diagnosis Date  . Carotid artery occlusion 11/10/10    LEFT CAROTID ENDARTERECTOMY  . Diabetes mellitus   . Hypertension   . Hyperlipidemia   . COPD (chronic obstructive pulmonary disease)   . DJD (degenerative joint disease)   . GERD (gastroesophageal reflux disease)   . Substernal chest pain   . Visual color changes     LEFT EYE;  BUT NOT TOTAL BLINDNESS  . Slurred speech     AS PER WIFE IN D/C NOTE 11/10/10  . PAD (peripheral artery disease)     Distal aortogram June 2012. Atherectomy left popliteal artery July 2012.   . Barrett's esophagus   . Fatty liver   . Diverticulosis of colon (without mention of hemorrhage)   . Sleep apnea   . Emphysema of lung     Past Surgical History  Procedure Date  . Laminectomy     X 3 LUMBAR AND X 2 CERVICAL SPINE OPERATIONS  . Carotid endarterectomy 11/10/10  . Hernia repair     LEFT INGUINAL AND UMBILICAL REPAIRS  . Anterior fusion cervical spine 02/06/06    C4-5, C5-6, C6-7; SURGEON DR. MAX COHEN  . Penile prosthesis implant 08/14/05    INFRAPUBIC INSERTION OF INFLATABLE PENILE PROSTHESIS; SURGEON DR. Amalia Hailey  . Cardiac catheterization 10/31/04  . Laparoscopic cholecystectomy w/ cholangiography 11/09/04    SURGEON DR. Luella Cook  . Total knee arthroplasty 07/2002    RIGHT KNEE ; SURGEON  DR. [name] ALSO HAD ARTHROSCOPIC RIGHT KNEE IN  10/2001  . Shoulder arthroscopy   . Cholecystectomy   . Amputation 11/05/2011    Procedure: AMPUTATION RAY;  Surgeon: Wylene Simmer, MD;  Location: [place];  Service: Orthopedics;  Laterality: Right;  Amputation of Right 4&5th Toes    Current  Outpatient Prescriptions  Medication Sig Dispense Refill  . ALPRAZolam (XANAX) 0.5 MG tablet Take 0.25 mg by mouth 2 (two) times daily.       Marland Kitchen amLODipine-valsartan (EXFORGE) 10-320 MG per tablet Take 1 tablet by mouth daily.        Marland Kitchen aspirin 81 MG EC tablet Take 81 mg by mouth 2 (two) times daily.       . carvedilol (COREG) 12.5 MG tablet Take 25 mg by mouth 2 (two) times daily with a meal.       . Choline Fenofibrate (TRILIPIX) 135 MG capsule Take 135 mg by mouth daily.        Marland Kitchen FLUoxetine (PROZAC) 20 MG capsule Take 20 mg by mouth daily.       . furosemide (LASIX) 40 MG tablet Take 1 tablet (40 mg total) by mouth 2 (two) times daily.  30 tablet  0  . glyBURIDE-metformin (GLUCOVANCE) 2.5-500 MG per tablet  Take 2 tablets by mouth 2 (two) times daily with a meal.       . insulin glargine (LANTUS) 100 UNIT/ML injection Inject 50 Units into the skin 2 (two) times daily.       . lansoprazole (PREVACID) 30 MG capsule Take 30 mg by mouth 2 (two) times daily.      Marland Kitchen omega-3 acid ethyl esters (LOVAZA) 1 G capsule Take 4 g by mouth daily.       . potassium chloride SA (K-DUR,KLOR-CON) 20 MEQ tablet Take 20 mEq by mouth daily.       . pregabalin (LYRICA) 75 MG capsule Take 75 mg by mouth 2 (two) times daily.      Marland Kitchen rOPINIRole (REQUIP) 2 MG tablet Take 2 mg by mouth at bedtime.        . terbinafine (LAMISIL) 250 MG tablet Take 1 tablet (250 mg total) by mouth daily.  30 tablet  2  . traMADol (ULTRAM) 50 MG tablet Take 50 mg by mouth every 6 (six) hours as needed. For pain      . atorvastatin (LIPITOR) 40 MG tablet 1 tab daily      . oxyCODONE-acetaminophen (TYLOX) 5-500 MG per capsule Take 1 capsule by mouth every 4 (four) hours as needed. For pain        Allergies  Allergen Reactions  . Adhesive (Tape) Rash    History   Social History  . Marital Status: Married    Spouse Name: N/A    Number of Children: N/A  . Years of Education: N/A   Occupational History  . TRUCK DRIVER    Social History Main Topics  . Smoking status: Former Smoker -- 0.5 packs/day for 35 years    Types: Cigarettes    Quit date: 10/31/2011  . Smokeless tobacco: Never Used  . Alcohol Use: Yes     occasionally  . Drug Use: No  . Sexually Active: Not on file   Other Topics Concern  . Not on file   Social History Narrative   HEAVY SMOKER AND CONTINUES TO SMOKE 1 PPD. DOES NOT EXERCISE REGULARLY. Wife [phone] Lebron Quam). Has 2 sons and daughter. Still active    Family History  Problem Relation Age of Onset  . Heart disease Father   . Diabetes Father   . Cancer Father     PROSTATE  . Arthritis      GRANDMOTHER  . Hypertension      OTHER FAMILY MEMBERS  . Colon cancer Brother     Review  of Systems:   As stated in the HPI and otherwise negative.   BP 153/85  Pulse 66  Ht 6\' 2"  (1.88 m)  Wt 249 lb (112.946 kg)  BMI 31.97 kg/m2  Physical Examination: General: Well developed, well nourished, NAD HEENT: OP clear, mucus membranes moist SKIN: warm, dry. No rashes. Neuro: No focal deficits Musculoskeletal: Muscle strength 5/5 all ext Psychiatric: Mood and affect normal Neck: No JVD, no carotid bruits, no thyromegaly, no lymphadenopathy. Lungs:Clear bilaterally, no wheezes, rhonci, crackles Cardiovascular: Regular rate and rhythm. No murmurs, gallops or rubs. Abdomen:Soft. Bowel sounds present. Non-tender.  Extremities: No lower extremity edema. Pulses are 1-2 + in the right  DP/PT and 1+ in the left PT. Amputation of right 4th-5th toes.No ulcerations.

## 2012-03-04 ENCOUNTER — Encounter (INDEPENDENT_AMBULATORY_CARE_PROVIDER_SITE_OTHER): Payer: BC Managed Care – PPO

## 2012-03-04 DIAGNOSIS — I739 Peripheral vascular disease, unspecified: Secondary | ICD-10-CM

## 2012-03-10 ENCOUNTER — Encounter (HOSPITAL_COMMUNITY): Payer: Self-pay | Admitting: Pharmacy Technician

## 2012-03-10 ENCOUNTER — Telehealth: Payer: Self-pay | Admitting: *Deleted

## 2012-03-10 ENCOUNTER — Encounter: Payer: Self-pay | Admitting: *Deleted

## 2012-03-10 DIAGNOSIS — I739 Peripheral vascular disease, unspecified: Secondary | ICD-10-CM

## 2012-03-10 NOTE — Telephone Encounter (Signed)
Spoke with [name] and he would like to proceed with angiogram. Procedure scheduled for March 19, 2012 at 10:30 with Dr. Angelena Form. [name] will come in on March 11, 2012 for labs.  I verbally reviewed all instructions with [name] and he verbalizes understanding.  He will pick up instructions at front desk when he comes in for lab work.

## 2012-03-11 ENCOUNTER — Other Ambulatory Visit (INDEPENDENT_AMBULATORY_CARE_PROVIDER_SITE_OTHER): Payer: BC Managed Care – PPO

## 2012-03-11 DIAGNOSIS — I739 Peripheral vascular disease, unspecified: Secondary | ICD-10-CM

## 2012-03-12 LAB — CBC WITH DIFFERENTIAL/PLATELET
Basophils Absolute: 0 10*3/uL (ref 0.0–0.1)
Basophils Relative: 0.3 % (ref 0.0–3.0)
Eosinophils Absolute: 0.1 10*3/uL (ref 0.0–0.7)
Eosinophils Relative: 2 % (ref 0.0–5.0)
HCT: 38.5 % — ABNORMAL LOW (ref 39.0–52.0)
Hemoglobin: 13 g/dL (ref 13.0–17.0)
Lymphocytes Relative: 20 % (ref 12.0–46.0)
Lymphs Abs: 1.3 10*3/uL (ref 0.7–4.0)
MCHC: 33.8 g/dL (ref 30.0–36.0)
MCV: 88.1 fl (ref 78.0–100.0)
Monocytes Absolute: 0.4 10*3/uL (ref 0.1–1.0)
Monocytes Relative: 6.5 % (ref 3.0–12.0)
Neutro Abs: 4.6 10*3/uL (ref 1.4–7.7)
Neutrophils Relative %: 71.2 % (ref 43.0–77.0)
Platelets: 184 10*3/uL (ref 150.0–400.0)
RBC: 4.37 Mil/uL (ref 4.22–5.81)
RDW: 12.1 % (ref 11.5–14.6)
WBC: 6.4 10*3/uL (ref 4.5–10.5)

## 2012-03-12 LAB — BASIC METABOLIC PANEL
BUN: 10 mg/dL (ref 6–23)
CO2: 23 mEq/L (ref 19–32)
Calcium: 8.9 mg/dL (ref 8.4–10.5)
Chloride: 96 mEq/L (ref 96–112)
Creatinine, Ser: 1 mg/dL (ref 0.4–1.5)
GFR: 85.57 mL/min (ref >60.00)
Glucose, Bld: 353 mg/dL — ABNORMAL HIGH (ref 70–99)
Potassium: 3.8 mEq/L (ref 3.5–5.1)
Sodium: 132 mEq/L — ABNORMAL LOW (ref 135–145)

## 2012-03-12 LAB — PROTIME-INR
INR: 1 ratio (ref 0.8–1.0)
Prothrombin Time: 10.9 s (ref 10.2–12.4)

## 2012-03-12 NOTE — Addendum Note (Signed)
Addended by: Lauree Chandler D on: 03/12/2012 02:14 PM   Modules accepted: Orders

## 2012-03-13 ENCOUNTER — Encounter (HOSPITAL_COMMUNITY): Payer: Self-pay | Admitting: *Deleted

## 2012-03-13 ENCOUNTER — Emergency Department (HOSPITAL_COMMUNITY): Payer: BC Managed Care – PPO

## 2012-03-13 ENCOUNTER — Emergency Department (HOSPITAL_COMMUNITY)
Admission: EM | Admit: 2012-03-13 | Discharge: 2012-03-13 | Disposition: A | Discharge status: Home / Self Care | Payer: BC Managed Care – PPO | Attending: Emergency Medicine | Admitting: Emergency Medicine

## 2012-03-13 ENCOUNTER — Other Ambulatory Visit: Payer: Self-pay

## 2012-03-13 DIAGNOSIS — Z87891 Personal history of nicotine dependence: Secondary | ICD-10-CM | POA: Insufficient documentation

## 2012-03-13 DIAGNOSIS — R079 Chest pain, unspecified: Secondary | ICD-10-CM | POA: Insufficient documentation

## 2012-03-13 DIAGNOSIS — J4489 Other specified chronic obstructive pulmonary disease: Secondary | ICD-10-CM | POA: Insufficient documentation

## 2012-03-13 DIAGNOSIS — I10 Essential (primary) hypertension: Secondary | ICD-10-CM | POA: Insufficient documentation

## 2012-03-13 DIAGNOSIS — Z981 Arthrodesis status: Secondary | ICD-10-CM | POA: Insufficient documentation

## 2012-03-13 DIAGNOSIS — J449 Chronic obstructive pulmonary disease, unspecified: Secondary | ICD-10-CM | POA: Insufficient documentation

## 2012-03-13 DIAGNOSIS — Z7982 Long term (current) use of aspirin: Secondary | ICD-10-CM | POA: Insufficient documentation

## 2012-03-13 DIAGNOSIS — E119 Type 2 diabetes mellitus without complications: Secondary | ICD-10-CM | POA: Insufficient documentation

## 2012-03-13 DIAGNOSIS — E785 Hyperlipidemia, unspecified: Secondary | ICD-10-CM | POA: Insufficient documentation

## 2012-03-13 DIAGNOSIS — G4730 Sleep apnea, unspecified: Secondary | ICD-10-CM | POA: Insufficient documentation

## 2012-03-13 DIAGNOSIS — R55 Syncope and collapse: Secondary | ICD-10-CM | POA: Insufficient documentation

## 2012-03-13 DIAGNOSIS — Z79899 Other long term (current) drug therapy: Secondary | ICD-10-CM | POA: Insufficient documentation

## 2012-03-13 DIAGNOSIS — R42 Dizziness and giddiness: Secondary | ICD-10-CM | POA: Insufficient documentation

## 2012-03-13 LAB — BASIC METABOLIC PANEL
BUN: 13 mg/dL (ref 6–23)
CO2: 26 mEq/L (ref 19–32)
Calcium: 9 mg/dL (ref 8.4–10.5)
Chloride: 97 mEq/L (ref 96–112)
Creatinine, Ser: 0.83 mg/dL (ref 0.50–1.35)
GFR calc Af Amer: >90 mL/min (ref >90)
GFR calc non Af Amer: >90 mL/min (ref >90)
Glucose, Bld: 384 mg/dL — ABNORMAL HIGH (ref 70–99)
Potassium: 3.9 mEq/L (ref 3.5–5.1)
Sodium: 135 mEq/L (ref 135–145)

## 2012-03-13 LAB — CBC WITH DIFFERENTIAL/PLATELET
Basophils Absolute: 0 10*3/uL (ref 0.0–0.1)
Basophils Relative: 1 % (ref 0–1)
Eosinophils Absolute: 0.2 10*3/uL (ref 0.0–0.7)
Eosinophils Relative: 3 % (ref 0–5)
HCT: 31.9 % — ABNORMAL LOW (ref 39.0–52.0)
Hemoglobin: 11.7 g/dL — ABNORMAL LOW (ref 13.0–17.0)
Lymphocytes Relative: 26 % (ref 12–46)
Lymphs Abs: 1.4 10*3/uL (ref 0.7–4.0)
MCH: 29.7 pg (ref 26.0–34.0)
MCHC: 36.7 g/dL — ABNORMAL HIGH (ref 30.0–36.0)
MCV: 81 fL (ref 78.0–100.0)
Monocytes Absolute: 0.4 10*3/uL (ref 0.1–1.0)
Monocytes Relative: 7 % (ref 3–12)
Neutro Abs: 3.6 10*3/uL (ref 1.7–7.7)
Neutrophils Relative %: 65 % (ref 43–77)
Platelets: 163 10*3/uL (ref 150–400)
RBC: 3.94 MIL/uL — ABNORMAL LOW (ref 4.22–5.81)
RDW: 12.2 % (ref 11.5–15.5)
WBC: 5.6 10*3/uL (ref 4.0–10.5)

## 2012-03-13 LAB — TROPONIN I: Troponin I: <0.3 ng/mL (ref <0.30)

## 2012-03-13 NOTE — ED Notes (Signed)
Pt on monitor, resting comfortably 

## 2012-03-13 NOTE — ED Provider Notes (Signed)
History     CSN: [id number]  Arrival date & time 03/13/12  1733   First MD Initiated Contact with Patient 03/13/12 1802      Chief Complaint  Patient presents with  . near syncope     (Consider location/radiation/quality/duration/timing/severity/associated sxs/prior treatment) HPI Comments: Patient with a history of DM, HTN, hyperlipidemia, carotid artery disease, and peripheral artery disease comes in with a chief complaint of dizziness.  Patient reports that he has been feeling dizzy intermittently over the past 2 months.  He describes the dizziness as feeling lightheaded and nausea.  He reports that the dizziness is also associated with a headache.  He also reports that he was having chest pain two days ago.  No chest pain today.  He had been feeling these symptoms every other day for the past 2 months.  He reports that the symptoms typically resolve after an hour.  However, over the past 2 days the symptoms have lasted for 7-8 hours, which is why he came to the ED.  He has been seen by his Cardiologist and his PCP for this.  He was given a Holter monitor, which he reports did not show any abnormalities.  He also had a recent Carotid Doppler done in April of 2013, which showed stable carotid disease.  He is s/p left CEA done by Dr. Kellie Simmering approximately 1 year ago.  He also has a history of peripheral artery disease. He has a LE angiogram scheduled on 03-19-12.  His PCP is Dr. Wilson Singer.  He also reports that his blood sugars have been running in the 300's at home.  He is currently on Lantus and Glyburide-Metformin for his Diabetes.  He has been taking medications as prescribed.  He reports that his DM is followed by Dr. Wilson Singer.  He denies any nausea or vomiting.  The history is provided by the patient.    Past Medical History  Diagnosis Date  . Carotid artery occlusion 11/10/10    LEFT CAROTID ENDARTERECTOMY  . Diabetes mellitus   . Hypertension   . Hyperlipidemia   . COPD (chronic obstructive  pulmonary disease)   . DJD (degenerative joint disease)   . GERD (gastroesophageal reflux disease)   . Substernal chest pain   . 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 Slurred speech     AS PER WIFE IN D/C NOTE 11/10/10  . PAD (peripheral artery disease)     Distal aortogram June 2012. Atherectomy left popliteal artery July 2012.   . Barrett's esophagus   . Fatty liver   . Diverticulosis of colon (without mention of hemorrhage)   . Sleep apnea   . Emphysema of lung     Past Surgical History  Procedure Date  . Laminectomy     X 3 LUMBAR AND X 2 CERVICAL SPINE OPERATIONS  . Carotid endarterectomy 11/10/10  . Hernia repair     LEFT INGUINAL AND UMBILICAL REPAIRS  . Anterior fusion cervical spine 02/06/06    C4-5, C5-6, C6-7; SURGEON DR. MAX COHEN  . Penile prosthesis implant 08/14/05    INFRAPUBIC INSERTION OF INFLATABLE PENILE PROSTHESIS; SURGEON DR. Amalia Hailey  . Cardiac catheterization 10/31/04  . Laparoscopic cholecystectomy w/ cholangiography 11/09/04    SURGEON DR. Luella Cook  . Total knee arthroplasty 07/2002    RIGHT KNEE ; SURGEON  DR. [name] ALSO HAD ARTHROSCOPIC RIGHT KNEE IN  10/2001  . Shoulder arthroscopy   . Cholecystectomy   . Amputation  11/05/2011    Procedure: AMPUTATION RAY;  Surgeon: Wylene Simmer, MD;  Location: [facility];  Service: Orthopedics;  Laterality: Right;  Amputation of Right 4&5th Toes    Family History  Problem Relation Age of Onset  . Heart disease Father   . Diabetes Father   . Cancer Father     PROSTATE  . Arthritis      GRANDMOTHER  . Hypertension      OTHER FAMILY MEMBERS  . Colon cancer Brother     History  Substance Use Topics  . Smoking status: Former Smoker -- 0.5 packs/day for 35 years    Types: Cigarettes    Quit date: 10/31/2011  . Smokeless tobacco: Never Used  . Alcohol Use: Yes     occasionally      Review of Systems  Constitutional: Negative for fever, chills and diaphoresis.  Respiratory: Negative  for shortness of breath.   Cardiovascular: Positive for chest pain. Negative for palpitations and leg swelling.  Gastrointestinal: Positive for nausea. Negative for vomiting and abdominal pain.  Neurological: Positive for dizziness, light-headedness and headaches. Negative for syncope, facial asymmetry and speech difficulty.  Psychiatric/Behavioral: Negative for confusion.  All other systems reviewed and are negative.    Allergies  Adhesive  Home Medications   Current Outpatient Rx  Name Route Sig Dispense Refill  . ALPRAZOLAM 0.5 MG PO TABS Oral Take 0.5 mg by mouth daily.     Marland Kitchen AMLODIPINE BESYLATE-VALSARTAN 10-320 MG PO TABS Oral Take 1 tablet by mouth daily.      . ASPIRIN 81 MG PO TBEC Oral Take 81 mg by mouth 2 (two) times daily.     . ATORVASTATIN CALCIUM 40 MG PO TABS Oral Take 40 mg by mouth daily. 1 tab daily    . CARVEDILOL 12.5 MG PO TABS Oral Take by mouth 2 (two) times daily with a meal.     . CHOLINE FENOFIBRATE 135 MG PO CPDR Oral Take 135 mg by mouth daily.      Marland Kitchen FLUOXETINE HCL 20 MG PO CAPS Oral Take 20 mg by mouth daily.     . GLYBURIDE-METFORMIN 2.5-500 MG PO TABS Oral Take 2 tablets by mouth 2 (two) times daily with a meal.     . INSULIN GLARGINE 100 UNIT/ML [place] SOLN Subcutaneous Inject 50 Units into the skin 2 (two) times daily.     Marland Kitchen LANSOPRAZOLE 30 MG PO CPDR Oral Take 30 mg by mouth 2 (two) times daily.    . OMEGA-3-ACID ETHYL ESTERS 1 G PO CAPS Oral Take 2 g by mouth 2 (two) times daily.     . OXYCODONE-ACETAMINOPHEN 5-500 MG PO CAPS Oral Take 1 capsule by mouth every 8 (eight) hours as needed. For pain    . PREGABALIN 75 MG PO CAPS Oral Take 75 mg by mouth 3 (three) times daily.     Marland Kitchen ROPINIROLE HCL 2 MG PO TABS Oral Take 2 mg by mouth at bedtime.      . TAMSULOSIN HCL 0.4 MG PO CAPS Oral Take 0.4 mg by mouth daily.    . TERBINAFINE HCL 250 MG PO TABS Oral Take 1 tablet (250 mg total) by mouth daily. 30 tablet 2  . TRAMADOL HCL 50 MG PO TABS Oral Take 50 mg  by mouth every 6 (six) hours as needed. For pain    . TRIAMTERENE-HCTZ 37.5-25 MG PO CAPS Oral Take 1 capsule by mouth every other day.      BP 155/72  Pulse  72  Temp 98.1 F (36.7 C) (Oral)  Resp 18  SpO2 99%  Physical Exam  Nursing note and vitals reviewed. Constitutional: He appears well-developed and well-nourished. No distress.  HENT:  Head: Normocephalic and atraumatic.  Mouth/Throat: Oropharynx is clear and moist.  Eyes: EOM are normal. Pupils are equal, round, and reactive to light.  Neck: Normal range of motion. Neck supple.  Cardiovascular: Normal rate, regular rhythm, normal heart sounds and intact distal pulses.   Pulses:      Dorsalis pedis pulses are 2+ on the right side, and 2+ on the left side.  Pulmonary/Chest: Effort normal and breath sounds normal. No respiratory distress. He has no wheezes. He has no rales. He exhibits no tenderness.  Abdominal: Soft. He exhibits no distension and no mass. There is no tenderness.  Musculoskeletal: Normal range of motion. He exhibits no edema and no tenderness.       Right 4th and 5th toe amputation.  No erythema or drainage.  Neurological: He is alert. He has normal strength. No cranial nerve deficit or sensory deficit. Coordination and gait normal.  Skin: Skin is warm and dry. He is not diaphoretic.  Psychiatric: He has a normal mood and affect.    ED Course  Procedures (including critical care time)   Labs Reviewed  CBC WITH DIFFERENTIAL  BASIC METABOLIC PANEL  TROPONIN I   Dg Chest 2 View  03/13/2012  *RADIOLOGY REPORT*  Clinical Data: Syncope.  Chest pain.  CHEST - 2 VIEW  Comparison: 08/16/2011  Findings: Stable peribronchial thickening.  Heart is normal size. Lungs are clear.  No effusions.  Degenerative changes in the thoracic spine.  IMPRESSION: Stable chronic bronchitic changes.  Original Report Authenticated By: Raelyn Number, M.D.     No diagnosis found.   Date: 03/14/2012  Rate: 78  Rhythm: normal sinus  rhythm  QRS Axis: left  Intervals: normal  ST/T Wave abnormalities: nonspecific T wave changes  Conduction Disutrbances:none  Narrative Interpretation:   Old EKG Reviewed: unchanged  Patient discussed with Dr. Roderic Palau.  MDM  Patient presenting with intermittent dizziness over the past 2 months.  Both his PCP and his Cardiologist have evaluated him for this dizziness.  He had Carotid Dopplers done in April 2013, which showed stable carotid disease.  He has worn a Holter Monitor, which he does not think showed any abnormalities.  His PCP is currently managing his DM and making necessary changes to his medication.  He is not orthostatic.  No changes on his EKG.  Negative troponin.  Labs unremarkable, aside from elevated blood sugar.  He reports that he has not had his evening dose of DM medication.  No DKA.  Therefore, feel that patient can be discharged home with PCP follow up.        Sherlyn Lees [place], PA-C 03/14/12 [phone]

## 2012-03-13 NOTE — ED Notes (Signed)
D/c instructions reviewed w/ pt and family - pt and family deny any further questions or concerns at present. Pt ambulating independently w/ steady gait on d/c in no acute distress, A&Ox4.  

## 2012-03-13 NOTE — ED Notes (Signed)
Pt reports intermittent episodes of dizziness x2 months - pt w/ hx of carotid artery blockage. Pt states dizziness has increased in the past two days - family states during these episodes pt seems "out of it," pt denies any HA, slurred speech, or weakness - no gross neuro deficits noted on exam.

## 2012-03-13 NOTE — ED Notes (Signed)
Near syncope for 2 months intermittently.  Worse for one week.  He has had numerus clots.  He says when he stands up he feel like he is going to pass out

## 2012-03-13 NOTE — ED Notes (Signed)
Pt states that he felt slightly lightheaded upon standing.

## 2012-03-14 NOTE — ED Provider Notes (Signed)
Medical screening examination/treatment/procedure(s) were performed by non-physician practitioner and as supervising physician I was immediately available for consultation/collaboration.   Maudry Diego, MD 03/14/12 1525

## 2012-03-17 ENCOUNTER — Telehealth: Payer: Self-pay | Admitting: Licensed Clinical Social Worker

## 2012-03-17 ENCOUNTER — Emergency Department (HOSPITAL_COMMUNITY)
Admission: EM | Admit: 2012-03-17 | Discharge: 2012-03-17 | Disposition: A | Discharge status: Home / Self Care | Payer: BC Managed Care – PPO | Attending: Emergency Medicine | Admitting: Emergency Medicine

## 2012-03-17 ENCOUNTER — Encounter (HOSPITAL_COMMUNITY): Payer: Self-pay | Admitting: Emergency Medicine

## 2012-03-17 DIAGNOSIS — I899 Noninfective disorder of lymphatic vessels and lymph nodes, unspecified: Secondary | ICD-10-CM | POA: Insufficient documentation

## 2012-03-17 DIAGNOSIS — L089 Local infection of the skin and subcutaneous tissue, unspecified: Secondary | ICD-10-CM | POA: Insufficient documentation

## 2012-03-17 DIAGNOSIS — S99929A Unspecified injury of unspecified foot, initial encounter: Secondary | ICD-10-CM

## 2012-03-17 DIAGNOSIS — I10 Essential (primary) hypertension: Secondary | ICD-10-CM | POA: Insufficient documentation

## 2012-03-17 DIAGNOSIS — G4730 Sleep apnea, unspecified: Secondary | ICD-10-CM | POA: Insufficient documentation

## 2012-03-17 DIAGNOSIS — E119 Type 2 diabetes mellitus without complications: Secondary | ICD-10-CM | POA: Insufficient documentation

## 2012-03-17 DIAGNOSIS — J449 Chronic obstructive pulmonary disease, unspecified: Secondary | ICD-10-CM | POA: Insufficient documentation

## 2012-03-17 DIAGNOSIS — K219 Gastro-esophageal reflux disease without esophagitis: Secondary | ICD-10-CM | POA: Insufficient documentation

## 2012-03-17 DIAGNOSIS — E785 Hyperlipidemia, unspecified: Secondary | ICD-10-CM | POA: Insufficient documentation

## 2012-03-17 DIAGNOSIS — Z794 Long term (current) use of insulin: Secondary | ICD-10-CM | POA: Insufficient documentation

## 2012-03-17 DIAGNOSIS — Z87891 Personal history of nicotine dependence: Secondary | ICD-10-CM | POA: Insufficient documentation

## 2012-03-17 DIAGNOSIS — J4489 Other specified chronic obstructive pulmonary disease: Secondary | ICD-10-CM | POA: Insufficient documentation

## 2012-03-17 MED ORDER — CLINDAMYCIN HCL 150 MG PO CAPS
300.0000 mg | ORAL_CAPSULE | Freq: Once | ORAL | Status: AC
  Administered 2012-03-17: 300 mg via ORAL
  Filled 2012-03-17: qty 2

## 2012-03-17 MED ORDER — SULFAMETHOXAZOLE-TMP DS 800-160 MG PO TABS
1.0000 | ORAL_TABLET | Freq: Once | ORAL | Status: AC
  Administered 2012-03-17: 1 via ORAL
  Filled 2012-03-17: qty 1

## 2012-03-17 MED ORDER — CLINDAMYCIN HCL 300 MG PO CAPS: 300.0000 mg | ORAL_CAPSULE | Freq: Three times a day (TID) | ORAL | Status: AC

## 2012-03-17 MED ORDER — SULFAMETHOXAZOLE-TMP DS 800-160 MG PO TABS: 1.0000 | ORAL_TABLET | Freq: Two times a day (BID) | ORAL | Status: AC

## 2012-03-17 NOTE — ED Notes (Addendum)
PT has left big toenail avulsion. Pt also reports sore starting on left toe. Pt is diabetic.

## 2012-03-17 NOTE — ED Provider Notes (Signed)
History     CSN: [id number]  Arrival date & time 03/17/12  1903   None     Chief Complaint  Patient presents with  . Toe Injury    (Consider location/radiation/quality/duration/timing/severity/associated sxs/prior treatment) HPI Comments: Patient is an insulin-dependent diabetic, with a previous history of toe amputations due to infections, presents to the emergency room tonight with an infection to his left second toe, lateral to the nail.  He stated drain a small amount today while he was pushing off his shoe from his right foot, with his left foot.  He inadvertently pushed the great toenail back   he did have a small amount of bleeding.  The nail did have on the  Fungal  Infection.  Bleeding at this time  The history is provided by the patient.    Past Medical History  Diagnosis Date  . Carotid artery occlusion 11/10/10    LEFT CAROTID ENDARTERECTOMY  . Diabetes mellitus   . Hypertension   . Hyperlipidemia   . COPD (chronic obstructive pulmonary disease)   . DJD (degenerative joint disease)   . GERD (gastroesophageal reflux disease)   . Substernal chest pain   . 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 Slurred speech     AS PER WIFE IN D/C NOTE 11/10/10  . PAD (peripheral artery disease)     Distal aortogram June 2012. Atherectomy left popliteal artery July 2012.   . Barrett's esophagus   . Fatty liver   . Diverticulosis of colon (without mention of hemorrhage)   . Sleep apnea   . Emphysema of lung     Past Surgical History  Procedure Date  . Laminectomy     X 3 LUMBAR AND X 2 CERVICAL SPINE OPERATIONS  . Carotid endarterectomy 11/10/10  . Hernia repair     LEFT INGUINAL AND UMBILICAL REPAIRS  . Anterior fusion cervical spine 02/06/06    C4-5, C5-6, C6-7; SURGEON DR. MAX COHEN  . Penile prosthesis implant 08/14/05    INFRAPUBIC INSERTION OF INFLATABLE PENILE PROSTHESIS; SURGEON DR. Amalia Hailey  . Cardiac catheterization 10/31/04  . Laparoscopic  cholecystectomy w/ cholangiography 11/09/04    SURGEON DR. Luella Cook  . Total knee arthroplasty 07/2002    RIGHT KNEE ; SURGEON  DR. [name] ALSO HAD ARTHROSCOPIC RIGHT KNEE IN  10/2001  . Shoulder arthroscopy   . Cholecystectomy   . Amputation 11/05/2011    Procedure: AMPUTATION RAY;  Surgeon: Wylene Simmer, MD;  Location: [facility];  Service: Orthopedics;  Laterality: Right;  Amputation of Right 4&5th Toes    Family History  Problem Relation Age of Onset  . Heart disease Father   . Diabetes Father   . Cancer Father     PROSTATE  . Arthritis      GRANDMOTHER  . Hypertension      OTHER FAMILY MEMBERS  . Colon cancer Brother     History  Substance Use Topics  . Smoking status: Former Smoker -- 0.5 packs/day for 35 years    Types: Cigarettes    Quit date: 10/31/2011  . Smokeless tobacco: Never Used  . Alcohol Use: Yes     occasionally      Review of Systems  Constitutional: Negative for fever and chills.  Gastrointestinal: Negative for nausea.  Musculoskeletal: Negative for joint swelling.  Skin: Positive for wound.  Neurological: Negative for dizziness and weakness.    Allergies  Adhesive  Home Medications   Current  Outpatient Rx  Name Route Sig Dispense Refill  . ALPRAZOLAM 0.5 MG PO TABS Oral Take 0.5 mg by mouth daily.     Marland Kitchen AMLODIPINE BESYLATE-VALSARTAN 10-320 MG PO TABS Oral Take 1 tablet by mouth daily.      . ASPIRIN 81 MG PO TBEC Oral Take 81 mg by mouth 2 (two) times daily.     . ATORVASTATIN CALCIUM 40 MG PO TABS Oral Take 40 mg by mouth daily. 1 tab daily    . CARVEDILOL 12.5 MG PO TABS Oral Take by mouth 2 (two) times daily with a meal.     . CHOLINE FENOFIBRATE 135 MG PO CPDR Oral Take 135 mg by mouth daily.      Marland Kitchen FLUOXETINE HCL 20 MG PO CAPS Oral Take 20 mg by mouth daily.     . GLYBURIDE-METFORMIN 2.5-500 MG PO TABS Oral Take 2 tablets by mouth 2 (two) times daily with a meal.     . INSULIN GLARGINE 100 UNIT/ML [place] SOLN Subcutaneous Inject 50 Units  into the skin 2 (two) times daily.     Marland Kitchen LANSOPRAZOLE 30 MG PO CPDR Oral Take 30 mg by mouth 2 (two) times daily.    . OMEGA-3-ACID ETHYL ESTERS 1 G PO CAPS Oral Take 2 g by mouth 2 (two) times daily.     Marland Kitchen ONDANSETRON 4 MG PO TBDP Oral Take 4 mg by mouth Every 6 hours as needed. For nausea    . OXYCODONE-ACETAMINOPHEN 5-500 MG PO CAPS Oral Take 1 capsule by mouth every 8 (eight) hours as needed. For pain    . PREGABALIN 75 MG PO CAPS Oral Take 75 mg by mouth 3 (three) times daily.     Marland Kitchen ROPINIROLE HCL 2 MG PO TABS Oral Take 2 mg by mouth at bedtime.      . TAMSULOSIN HCL 0.4 MG PO CAPS Oral Take 0.4 mg by mouth daily.    . TERBINAFINE HCL 250 MG PO TABS Oral Take 1 tablet (250 mg total) by mouth daily. 30 tablet 2  . TRAMADOL HCL 50 MG PO TABS Oral Take 50 mg by mouth every 6 (six) hours as needed. For pain    . TRIAMTERENE-HCTZ 37.5-25 MG PO CAPS Oral Take 1 capsule by mouth every other day.      BP 164/86  Pulse 62  Temp 97.1 F (36.2 C) (Oral)  Resp 18  SpO2 99%  Physical Exam  Constitutional: He appears well-developed.  HENT:  Head: Normocephalic.  Eyes: Pupils are equal, round, and reactive to light.  Cardiovascular: Normal rate.   Pulmonary/Chest: Effort normal.  Musculoskeletal:       Feet:    ED Course  Procedures (including critical care time)  Labs Reviewed - No data to display No results found.   1. Toe injury   2. Toe infection       MDM   Due to patient's underlying condition of diabetes.  Will treat the infection in the left second toe with a combination antibiotic regime of Septra and clindamycin.  I have cleaned the wound thoroughly of the great, toe, by soaking in a Betadine bath applying a small amount of Neosporin under the nail and taped.  The nail in place applying a dressing, and have referred the patient to Primary  care physician for definitive care        Garald Balding, NP 03/17/12 2149  Garald Balding, NP 03/17/12 2149  Garald Balding, NP 03/17/12 2151

## 2012-03-17 NOTE — Progress Notes (Signed)
Orthopedic Tech Progress Note Patient Details:  [name] Jan 21, 1956 [id number]  Ortho Devices Type of Ortho Device: Postop boot Ortho Device/Splint Location: (L) LE Ortho Device/Splint Interventions: Application   Braulio Bosch 03/17/2012, 9:31 PM

## 2012-03-17 NOTE — ED Provider Notes (Signed)
Medical screening examination/treatment/procedure(s) were performed by non-physician practitioner and as supervising physician I was immediately available for consultation/collaboration.  Threasa Beards, MD 03/17/12 2219

## 2012-03-17 NOTE — Telephone Encounter (Signed)
Patient called stating that his great toe on the left foot is now showing signs of infection. Redness, drainage and pain. His toenail also is hanging off the great and bleeding, he did not want to wait and show [place] the toenail tomorrow at his appointment, he wanted to go to the ED to have the toenail removed. He kept the appointment for tomorrow for the redness and drainage of his toe.

## 2012-03-18 ENCOUNTER — Ambulatory Visit (INDEPENDENT_AMBULATORY_CARE_PROVIDER_SITE_OTHER): Payer: BC Managed Care – PPO | Admitting: Internal Medicine

## 2012-03-18 VITALS — BP 185/91 | HR 72 | Temp 98.2°F | Ht 74.0 in | Wt 255.0 lb

## 2012-03-18 DIAGNOSIS — L0291 Cutaneous abscess, unspecified: Secondary | ICD-10-CM

## 2012-03-18 DIAGNOSIS — L0390 Cellulitis, unspecified: Secondary | ICD-10-CM

## 2012-03-19 ENCOUNTER — Ambulatory Visit (HOSPITAL_COMMUNITY)
Admission: RE | Admit: 2012-03-19 | Discharge: 2012-03-19 | Disposition: A | Discharge status: Home / Self Care | Payer: BC Managed Care – PPO | Source: Ambulatory Visit | Attending: Cardiovascular Disease | Admitting: Cardiovascular Disease

## 2012-03-19 ENCOUNTER — Encounter (HOSPITAL_COMMUNITY)
Admission: RE | Disposition: A | Discharge status: Home / Self Care | Payer: Self-pay | Source: Ambulatory Visit | Attending: Cardiovascular Disease

## 2012-03-19 ENCOUNTER — Telehealth: Payer: Self-pay | Admitting: *Deleted

## 2012-03-19 DIAGNOSIS — J4489 Other specified chronic obstructive pulmonary disease: Secondary | ICD-10-CM | POA: Insufficient documentation

## 2012-03-19 DIAGNOSIS — L0390 Cellulitis, unspecified: Secondary | ICD-10-CM | POA: Insufficient documentation

## 2012-03-19 DIAGNOSIS — J449 Chronic obstructive pulmonary disease, unspecified: Secondary | ICD-10-CM | POA: Insufficient documentation

## 2012-03-19 DIAGNOSIS — E119 Type 2 diabetes mellitus without complications: Secondary | ICD-10-CM | POA: Insufficient documentation

## 2012-03-19 DIAGNOSIS — I739 Peripheral vascular disease, unspecified: Secondary | ICD-10-CM

## 2012-03-19 DIAGNOSIS — M1990 Unspecified osteoarthritis, unspecified site: Secondary | ICD-10-CM | POA: Insufficient documentation

## 2012-03-19 DIAGNOSIS — K219 Gastro-esophageal reflux disease without esophagitis: Secondary | ICD-10-CM | POA: Insufficient documentation

## 2012-03-19 DIAGNOSIS — E785 Hyperlipidemia, unspecified: Secondary | ICD-10-CM | POA: Insufficient documentation

## 2012-03-19 DIAGNOSIS — I70219 Atherosclerosis of native arteries of extremities with intermittent claudication, unspecified extremity: Secondary | ICD-10-CM | POA: Insufficient documentation

## 2012-03-19 DIAGNOSIS — I10 Essential (primary) hypertension: Secondary | ICD-10-CM | POA: Insufficient documentation

## 2012-03-19 DIAGNOSIS — S98139A Complete traumatic amputation of one unspecified lesser toe, initial encounter: Secondary | ICD-10-CM | POA: Insufficient documentation

## 2012-03-19 HISTORY — PX: LOWER EXTREMITY ANGIOGRAM: SHX5508

## 2012-03-19 LAB — GLUCOSE, CAPILLARY
Glucose-Capillary: 169 mg/dL — ABNORMAL HIGH (ref 70–99)
Glucose-Capillary: 165 mg/dL — ABNORMAL HIGH (ref 70–99)

## 2012-03-19 SURGERY — ANGIOGRAM, LOWER EXTREMITY
Anesthesia: LOCAL

## 2012-03-19 MED ORDER — SODIUM CHLORIDE 0.9 % IV SOLN: 250.0000 mL | INTRAVENOUS | Status: DC | PRN

## 2012-03-19 MED ORDER — ACETAMINOPHEN 325 MG PO TABS: 650.0000 mg | ORAL_TABLET | ORAL | Status: DC | PRN

## 2012-03-19 MED ORDER — ONDANSETRON HCL 4 MG/2ML IJ SOLN: 4.0000 mg | Freq: Four times a day (QID) | INTRAMUSCULAR | Status: DC | PRN

## 2012-03-19 MED ORDER — LIDOCAINE HCL (PF) 1 % IJ SOLN
INTRAMUSCULAR | Status: AC
  Filled 2012-03-19: qty 30

## 2012-03-19 MED ORDER — SODIUM CHLORIDE 0.9 % IV SOLN: INTRAVENOUS | Status: DC

## 2012-03-19 MED ORDER — OXYCODONE-ACETAMINOPHEN 5-325 MG PO TABS
1.0000 | ORAL_TABLET | Freq: Once | ORAL | Status: AC
  Administered 2012-03-19: 1 via ORAL

## 2012-03-19 MED ORDER — SODIUM CHLORIDE 0.9 % IJ SOLN: 3.0000 mL | Freq: Two times a day (BID) | INTRAMUSCULAR | Status: DC

## 2012-03-19 MED ORDER — ASPIRIN 81 MG PO CHEW
324.0000 mg | CHEWABLE_TABLET | ORAL | Status: AC
  Administered 2012-03-19: 243 mg via ORAL
  Filled 2012-03-19: qty 3

## 2012-03-19 MED ORDER — MIDAZOLAM HCL 2 MG/2ML IJ SOLN
INTRAMUSCULAR | Status: AC
  Filled 2012-03-19: qty 2

## 2012-03-19 MED ORDER — FENTANYL CITRATE 0.05 MG/ML IJ SOLN
INTRAMUSCULAR | Status: AC
  Filled 2012-03-19: qty 2

## 2012-03-19 MED ORDER — HEPARIN (PORCINE) IN NACL 2-0.9 UNIT/ML-% IJ SOLN
INTRAMUSCULAR | Status: AC
  Filled 2012-03-19: qty 1000

## 2012-03-19 MED ORDER — DIAZEPAM 5 MG PO TABS
5.0000 mg | ORAL_TABLET | ORAL | Status: AC
  Administered 2012-03-19: 5 mg via ORAL
  Filled 2012-03-19: qty 1

## 2012-03-19 MED ORDER — SODIUM CHLORIDE 0.9 % IV SOLN
INTRAVENOUS | Status: DC
  Administered 2012-03-19: 10:00:00 via INTRAVENOUS

## 2012-03-19 MED ORDER — SODIUM CHLORIDE 0.9 % IJ SOLN: 3.0000 mL | INTRAMUSCULAR | Status: DC | PRN

## 2012-03-19 MED ORDER — OXYCODONE-ACETAMINOPHEN 5-325 MG PO TABS
ORAL_TABLET | ORAL | Status: AC
  Administered 2012-03-19: 1 via ORAL
  Filled 2012-03-19: qty 1

## 2012-03-19 NOTE — H&P (View-Only) (Signed)
History of Present Illness: 56 yo WM with h/o PAD s/p left popliteal artery intervention, carotid artery disease s/p Left CEA per Dr. Kellie Simmering, DM, HTN, HL, COPD, DJD, GERD here for PV follow up.His cardiac issues are followed by Dr. Rollene Fare in [facility].   I saw him May 24th 2012 for new PV evaluation. He described pain in both legs with walking. ABI in primary care with ABI of 0.75 on the left and 1.18 on the right. He also described weakness in both legs with walking. NO rest pain or ulcerations. I arranged a distal aortogram at [facility] on February 21, 2011. He was found to have a severe 90% left popliteal artery stenosis. He was brought back on July 11th 2012 for an atherectomy of the left popliteal artery. He had very little symptomatic improvement after the atherectomy and sought a second opinion with Dr. Victorino Dike in [facility]. He had a good pulse in his left foot and it was felt that his PAD was stable.  He has had amputations of his 4th-5th toes in February 2013 for osteomyelitis.   He has not been seen in our office since August 2012. He has over the last few months began to experience weakness and heaviness in both legs with walking 50 feet. No rest pain or ulcerations. He has stopped smoking but has been using an e-cigarette. He also describes some dizziness when bending over. He had carotid artery dopplers 12/12/11 in the [facility] office per Dr. Kellie Simmering which showed stable left CEA site with no restenosis and mild Right ICA stenosis, less than 39%. No chest pain or SOB.    Primary Care Physician: Anda Kraft  Past Medical History  Diagnosis Date  . Carotid artery occlusion 11/10/10    LEFT CAROTID ENDARTERECTOMY  . Diabetes mellitus   . Hypertension   . Hyperlipidemia   . COPD (chronic obstructive pulmonary disease)   . DJD (degenerative joint disease)   . GERD (gastroesophageal reflux disease)   . Substernal chest pain   . Visual color changes     LEFT EYE;  BUT NOT TOTAL BLINDNESS  . Slurred speech     AS PER WIFE IN D/C NOTE 11/10/10  . PAD (peripheral artery disease)     Distal aortogram June 2012. Atherectomy left popliteal artery July 2012.   . Barrett's esophagus   . Fatty liver   . Diverticulosis of colon (without mention of hemorrhage)   . Sleep apnea   . Emphysema of lung     Past Surgical History  Procedure Date  . Laminectomy     X 3 LUMBAR AND X 2 CERVICAL SPINE OPERATIONS  . Carotid endarterectomy 11/10/10  . Hernia repair     LEFT INGUINAL AND UMBILICAL REPAIRS  . Anterior fusion cervical spine 02/06/06    C4-5, C5-6, C6-7; SURGEON DR. MAX COHEN  . Penile prosthesis implant 08/14/05    INFRAPUBIC INSERTION OF INFLATABLE PENILE PROSTHESIS; SURGEON DR. Amalia Hailey  . Cardiac catheterization 10/31/04  . Laparoscopic cholecystectomy w/ cholangiography 11/09/04    SURGEON DR. Luella Cook  . Total knee arthroplasty 07/2002    RIGHT KNEE ; SURGEON  DR. [name] ALSO HAD ARTHROSCOPIC RIGHT KNEE IN  10/2001  . Shoulder arthroscopy   . Cholecystectomy   . Amputation 11/05/2011    Procedure: AMPUTATION RAY;  Surgeon: Wylene Simmer, MD;  Location: [facility];  Service: Orthopedics;  Laterality: Right;  Amputation of Right 4&5th Toes    Current  Outpatient Prescriptions  Medication Sig Dispense Refill  . ALPRAZolam (XANAX) 0.5 MG tablet Take 0.25 mg by mouth 2 (two) times daily.       Marland Kitchen amLODipine-valsartan (EXFORGE) 10-320 MG per tablet Take 1 tablet by mouth daily.        Marland Kitchen aspirin 81 MG EC tablet Take 81 mg by mouth 2 (two) times daily.       . carvedilol (COREG) 12.5 MG tablet Take 25 mg by mouth 2 (two) times daily with a meal.       . Choline Fenofibrate (TRILIPIX) 135 MG capsule Take 135 mg by mouth daily.        Marland Kitchen FLUoxetine (PROZAC) 20 MG capsule Take 20 mg by mouth daily.       . furosemide (LASIX) 40 MG tablet Take 1 tablet (40 mg total) by mouth 2 (two) times daily.  30 tablet  0  . glyBURIDE-metformin (GLUCOVANCE) 2.5-500 MG per tablet  Take 2 tablets by mouth 2 (two) times daily with a meal.       . insulin glargine (LANTUS) 100 UNIT/ML injection Inject 50 Units into the skin 2 (two) times daily.       . lansoprazole (PREVACID) 30 MG capsule Take 30 mg by mouth 2 (two) times daily.      Marland Kitchen omega-3 acid ethyl esters (LOVAZA) 1 G capsule Take 4 g by mouth daily.       . potassium chloride SA (K-DUR,KLOR-CON) 20 MEQ tablet Take 20 mEq by mouth daily.       . pregabalin (LYRICA) 75 MG capsule Take 75 mg by mouth 2 (two) times daily.      Marland Kitchen rOPINIRole (REQUIP) 2 MG tablet Take 2 mg by mouth at bedtime.        . terbinafine (LAMISIL) 250 MG tablet Take 1 tablet (250 mg total) by mouth daily.  30 tablet  2  . traMADol (ULTRAM) 50 MG tablet Take 50 mg by mouth every 6 (six) hours as needed. For pain      . atorvastatin (LIPITOR) 40 MG tablet 1 tab daily      . oxyCODONE-acetaminophen (TYLOX) 5-500 MG per capsule Take 1 capsule by mouth every 4 (four) hours as needed. For pain        Allergies  Allergen Reactions  . Adhesive (Tape) Rash    History   Social History  . Marital Status: Married    Spouse Name: N/A    Number of Children: N/A  . Years of Education: N/A   Occupational History  . TRUCK DRIVER    Social History Main Topics  . Smoking status: Former Smoker -- 0.5 packs/day for 35 years    Types: Cigarettes    Quit date: 10/31/2011  . Smokeless tobacco: Never Used  . Alcohol Use: Yes     occasionally  . Drug Use: No  . Sexually Active: Not on file   Other Topics Concern  . Not on file   Social History Narrative   HEAVY SMOKER AND CONTINUES TO SMOKE 1 PPD. DOES NOT EXERCISE REGULARLY. Wife [phone] Lebron Quam). Has 2 sons and daughter. Still active    Family History  Problem Relation Age of Onset  . Heart disease Father   . Diabetes Father   . Cancer Father     PROSTATE  . Arthritis      GRANDMOTHER  . Hypertension      OTHER FAMILY MEMBERS  . Colon cancer Brother     Review  of Systems:   As stated in the HPI and otherwise negative.   BP 153/85  Pulse 66  Ht 6\' 2"  (1.88 m)  Wt 249 lb (112.946 kg)  BMI 31.97 kg/m2  Physical Examination: General: Well developed, well nourished, NAD HEENT: OP clear, mucus membranes moist SKIN: warm, dry. No rashes. Neuro: No focal deficits Musculoskeletal: Muscle strength 5/5 all ext Psychiatric: Mood and affect normal Neck: No JVD, no carotid bruits, no thyromegaly, no lymphadenopathy. Lungs:Clear bilaterally, no wheezes, rhonci, crackles Cardiovascular: Regular rate and rhythm. No murmurs, gallops or rubs. Abdomen:Soft. Bowel sounds present. Non-tender.  Extremities: No lower extremity edema. Pulses are 1-2 + in the right  DP/PT and 1+ in the left PT. Amputation of right 4th-5th toes.No ulcerations.

## 2012-03-19 NOTE — Progress Notes (Signed)
  Subjective:    Patient ID: [name], male    DOB: 1956/02/21, 56 y.o.   MRN: [id number]  HPI He comes in for evaluation of cellulitis. He been previously treated by my partner, Dr. Drucilla Schmidt, or osteomyelitis with IV antibiotics. He has had a history of significant osteomyelitis in fourth and fifth toe amputation. He had gone to the emergency room because he had accidentally tore off the nail of his left great toe. Was also noted that he had some erythema at the nail bed of his second toe. He is being treated by Dr. Drucilla Schmidt with Lamisil for chronic onychomycosis.  He was seen in the emergency room and given a prescription for clindamycin and Bactrim for the cellulitis on his second toe. He comes in for second opinion on treatment of that the   Review of Systems  Constitutional: Negative for fever and chills.  Cardiovascular: Negative for leg swelling.  Musculoskeletal: Negative for myalgias, joint swelling and arthralgias.  Skin:       Erythema on the second       Objective:   Physical Exam  Constitutional: He appears well-developed and well-nourished.  Skin:       He does have some erythema on his left second toe and nail bed          Assessment & Plan:

## 2012-03-19 NOTE — Telephone Encounter (Signed)
Received message from Dr. Angelena Form that pt will need bilateral lower ext venous dopplers tomorrow or Friday to rule out DVT. Appt made for March 21, 2012 at 11:00.  Will also need follow up appt with Dr. Angelena Form in 3-4 weeks.  I called pt to give him this information. Left message on home and cell numbers to call back

## 2012-03-19 NOTE — Assessment & Plan Note (Signed)
I agree with treating for his cellulitis. I agree with the treatment and have told him that he can take one of the antibiotics and will take the Bactrim. He will return on a p.r.n. basis

## 2012-03-19 NOTE — Telephone Encounter (Signed)
Spoke with pt and he will be here for doppler study on Friday.  Appt made to see Dr. Angelena Form on April 15, 2012 at 2:00

## 2012-03-19 NOTE — Interval H&P Note (Signed)
History and Physical Interval Note:  03/19/2012 10:16 AM  [name]  has presented today for surgery, with the diagnosis of claudication  The various methods of treatment have been discussed with the patient and family. After consideration of risks, benefits and other options for treatment, the patient has consented to  Procedure(s) (LRB): LOWER EXTREMITY ANGIOGRAM (N/A) as a surgical intervention .  The patient's history has been reviewed, patient examined, no change in status, stable for surgery.  I have reviewed the patients' chart and labs.  Questions were answered to the patient's satisfaction.     Freeland Pracht

## 2012-03-19 NOTE — CV Procedure (Signed)
   Peripheral Vascular Operative Report  [facility] [id number] 7/10/201311:18 AM [name], MD  Procedure Performed:  1. Distal aortogram with bilateral lower extremity runoff 2. Access right femoral artery  Operator: Lauree Chandler, MD  Indication:   Known severe PAD, s/p atherectomy of the left popliteal artery in 2012. He has bilateral leg pain c/w claudication. ABI of 0.85 on the right and 1.0 on the left.                                     Procedure Details: The risks, benefits, complications, treatment options, and expected outcomes were discussed with the patient. The patient and/or family concurred with the proposed plan, giving informed consent. The patient was brought to the [facility] lab after IV hydration was begun and oral premedication was given. The patient was further sedated with Versed and Fentanyl. The right groin was prepped and draped in the usual manner. Using the modified Seldinger access technique, a 5 French sheath was placed in the right femoral artery. A pigtail catheter was used to perform angiography of the distal aorta. The catheter was pulled back to the bifurcation and the iliac vessels were visualized. I then crossed over to the left iliac artery and used a straight catheter to perform runoff of the left leg. I then performed runoff of the right leg through the sheath. Selective shots were taken behind the right knee.   There were no immediate complications. The patient was taken to the recovery area in stable condition.   Hemodynamic Findings: Central aortic pressure: 134/75  Angiographic Findings:  Distal aortogram with no evidence of aneurysm or stenosis.   Bilateral common iliac arteries, external iliac arteries and internal iliac arteries are patent.   The right common femoral artery is patent. The right SFA has mild plaque throughout. None of these stenoses are flow limiting. The right popliteal artery is patent with mild plaque. The  right ATA has 99% proximal diffuse stenosis. The right peroneal is patent with mild plaque. The right PTA is occluded proximally.   The left common femoral artery is patent. The left SFA is patent with mild diffuse plaque. The left popliteal atherectomy site is patent with mild stenosis. The left ATA is occluded proximally. The left PTA and peroneal are patent with mild plaque disease.   Impression: 1. Stable atherectomy of left popliteal artery 2. Severe stenosis right ATA 3. Severe stenosis left ATA  Recommendations: His ABI is mildly reduced on the right and normal on the left. Will continue medical therapy. Will check venous dopplers to exclude DVT in the office this week. Will consider referral for below the knee angioplasty if symptoms persist.        Complications:  None. The patient tolerated the procedure well.

## 2012-03-21 ENCOUNTER — Encounter (INDEPENDENT_AMBULATORY_CARE_PROVIDER_SITE_OTHER): Payer: BC Managed Care – PPO

## 2012-03-21 DIAGNOSIS — R609 Edema, unspecified: Secondary | ICD-10-CM

## 2012-03-21 DIAGNOSIS — I739 Peripheral vascular disease, unspecified: Secondary | ICD-10-CM

## 2012-03-21 DIAGNOSIS — M79609 Pain in unspecified limb: Secondary | ICD-10-CM

## 2012-03-26 ENCOUNTER — Telehealth: Payer: Self-pay | Admitting: Cardiovascular Disease

## 2012-03-26 NOTE — Telephone Encounter (Signed)
Pt's wife calling re results on lower venous he had on Friday, please call

## 2012-03-26 NOTE — Telephone Encounter (Signed)
Spoke with pt's wife and told her no clot was seen on Venous Doppler study. She states pt is continuing to have pain in right leg and is wondering if appt with Dr. Angelena Form can be moved to an earlier date.  Appt changed to April 01, 2012 at 2:00

## 2012-03-31 ENCOUNTER — Telehealth: Payer: Self-pay | Admitting: *Deleted

## 2012-03-31 NOTE — Telephone Encounter (Signed)
Patient came to office today. He thought that his appointment was today at 2 pm with Dr.McAlhany. Advised by the staff at the front desk that the appointment is tomorrow at 2 pm with Dr.McAlhany.

## 2012-04-01 ENCOUNTER — Encounter: Payer: Self-pay | Admitting: Cardiovascular Disease

## 2012-04-01 ENCOUNTER — Ambulatory Visit (INDEPENDENT_AMBULATORY_CARE_PROVIDER_SITE_OTHER): Payer: BC Managed Care – PPO | Admitting: Cardiovascular Disease

## 2012-04-01 VITALS — BP 139/78 | HR 71 | Ht 74.0 in | Wt 257.0 lb

## 2012-04-01 DIAGNOSIS — I739 Peripheral vascular disease, unspecified: Secondary | ICD-10-CM

## 2012-04-01 NOTE — Patient Instructions (Addendum)
Your physician recommends that you schedule a follow-up appointment in: with Dr Kellie Simmering in 7-10 days  Your physician recommends that you schedule a follow-up appointment in: 3 months

## 2012-04-01 NOTE — Assessment & Plan Note (Addendum)
He has severe PAD, with severe infra-popliteal disease. He has had recent amputations of the 4th/5th toes of the right foot in February 2013 and has healed these wounds. His severe right leg pain and weakness is limiting his ability to lead a normal life. I do not think this pain is related to his chronic back issues but this cannot be excluded. ABI is mildly reduced on the right at 0.85 and is normal on the left. TBI is 0.35 on the right.   I cannot offer percutaneous intervention below the knee.  I will refer him back over to [facility] to see Dr. Kellie Simmering to explore revascularization options.  If there are no good treatment options from a surgical standpoint, will consider referral to [facility] or [facility] for opinion on percutaneous intervention below the knee in the right leg. The patient is adamant that he must have something done. I have explained to him that his disease is severe and options are limited. He is also aware that long term patency with revascularization in the lower extremities is not favorable.

## 2012-04-01 NOTE — Progress Notes (Signed)
History of Present Illness: 56 yo WM with h/o PAD s/p left popliteal artery intervention, carotid artery disease s/p Left CEA per Dr. Kellie Simmering, DM, HTN, HL, COPD, DJD, GERD here for PV follow up.His cardiac issues are followed by Dr. Rollene Fare in [facility]. I saw him May 24th 2012 for new PV evaluation. He described pain in both legs with walking. ABI in primary care with ABI of 0.75 on the left and 1.18 on the right. He also described weakness in both legs with walking. NO rest pain or ulcerations. I arranged a distal aortogram at [facility] on February 21, 2011. He was found to have a severe 90% left popliteal artery stenosis. He was brought back on July 11th 2012 for an atherectomy of the left popliteal artery. He had some symptomatic improvement after the atherectomy but had continued pain in both legs and sought a second opinion with Dr. Victorino [facility]ike [facility]. He had a good pulse in his left foot and it was felt that his PAD was stable. He has had amputations of his 4th-5th toes in February 2013 for osteomyelitis.  I saw him for PV follow up on February 26, 2012. He described weakness and heaviness in both legs with walking 50 feet. No rest pain or ulcerations. He has stopped smoking but has been using an e-cigarette. He also had c/o dizziness. He had carotid artery dopplers 12/12/11 in the [facility] office per Dr. Kellie Simmering which showed stable left CEA site with no restenosis and mild Right ICA stenosis, less than 39%. No chest pain or SOB. I arranged a repeat distal aortogram on 03/19/12 which showed stable atherectomy site of left popliteal artery but severe below the knee disease. The right common femoral artery is patent. The right SFA has mild plaque throughout. None of these stenoses are flow limiting. The right popliteal artery is patent with mild plaque. The right ATA has 99% proximal diffuse stenosis. The right peroneal is patent with mild plaque. The right  PTA is occluded proximally. The left common femoral artery is patent. The left SFA is patent with mild diffuse plaque. The left popliteal atherectomy site is patent with mild stenosis. The left ATA is occluded proximally. The left PTA and peroneal are patent with mild plaque disease. Lower extremity venous dopplers negative for DVT on 03/21/12.   He is here today for follow up. His right leg pain is severe. His right leg is weak. It aches all of the times. He has healed his wounds on right foot post amputation. He cannot walk more than 100 feet before he has severe disabling pain in his right leg.    Primary Care Physician: Wilson Singer  Past Medical History  Diagnosis Date  . Carotid artery occlusion 11/10/10    LEFT CAROTID ENDARTERECTOMY  . Diabetes mellitus   . Hypertension   . Hyperlipidemia   . COPD (chronic obstructive pulmonary disease)   . DJD (degenerative joint disease)   . GERD (gastroesophageal reflux disease)   . Substernal chest pain   . 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 Slurred speech     AS PER WIFE IN D/C NOTE 11/10/10  . PAD (peripheral artery disease)     Distal aortogram June 2012. Atherectomy left popliteal artery July 2012.   . Barrett's esophagus   . Fatty liver   . Diverticulosis of colon (without mention of hemorrhage)   . Sleep apnea   .  Emphysema of lung     Past Surgical History  Procedure Date  . Laminectomy     X 3 LUMBAR AND X 2 CERVICAL SPINE OPERATIONS  . Carotid endarterectomy 11/10/10  . Hernia repair     LEFT INGUINAL AND UMBILICAL REPAIRS  . Anterior fusion cervical spine 02/06/06    C4-5, C5-6, C6-7; SURGEON DR. MAX COHEN  . Penile prosthesis implant 08/14/05    INFRAPUBIC INSERTION OF INFLATABLE PENILE PROSTHESIS; SURGEON DR. Amalia Hailey  . Cardiac catheterization 10/31/04  . Laparoscopic cholecystectomy w/ cholangiography 11/09/04    SURGEON DR. Luella Cook  . Total knee arthroplasty 07/2002    RIGHT KNEE ; SURGEON  DR.  [name] ALSO HAD ARTHROSCOPIC RIGHT KNEE IN  10/2001  . Shoulder arthroscopy   . Cholecystectomy   . Amputation 11/05/2011    Procedure: AMPUTATION RAY;  Surgeon: Wylene Simmer, MD;  Location: [facility];  Service: Orthopedics;  Laterality: Right;  Amputation of Right 4&5th Toes    Current Outpatient Prescriptions  Medication Sig Dispense Refill  . ALPRAZolam (XANAX) 0.5 MG tablet Take 0.5 mg by mouth daily.       Marland Kitchen amLODipine-valsartan (EXFORGE) 10-320 MG per tablet Take 1 tablet by mouth daily.        Marland Kitchen aspirin 81 MG EC tablet Take 81 mg by mouth 2 (two) times daily.       Marland Kitchen atorvastatin (LIPITOR) 40 MG tablet Take 40 mg by mouth daily. 1 tab daily      . carvedilol (COREG) 12.5 MG tablet Take by mouth 2 (two) times daily with a meal.       . Choline Fenofibrate (TRILIPIX) 135 MG capsule Take 135 mg by mouth daily.        Marland Kitchen FLUoxetine (PROZAC) 20 MG capsule Take 20 mg by mouth daily.       . insulin glargine (LANTUS) 100 UNIT/ML injection Inject 50 Units into the skin 2 (two) times daily.       . lansoprazole (PREVACID) 30 MG capsule Take 30 mg by mouth 2 (two) times daily.      Marland Kitchen omega-3 acid ethyl esters (LOVAZA) 1 G capsule Take 2 g by mouth 2 (two) times daily.       . ondansetron (ZOFRAN-ODT) 4 MG disintegrating tablet Take 4 mg by mouth Every 6 hours as needed. For nausea      . oxyCODONE-acetaminophen (TYLOX) 5-500 MG per capsule Take 1 capsule by mouth every 8 (eight) hours as needed. For pain      . pregabalin (LYRICA) 75 MG capsule Take 75 mg by mouth 3 (three) times daily.       Marland Kitchen rOPINIRole (REQUIP) 2 MG tablet Take 2 mg by mouth at bedtime.        . Tamsulosin HCl (FLOMAX) 0.4 MG CAPS Take 0.4 mg by mouth daily.      Marland Kitchen terbinafine (LAMISIL) 250 MG tablet Take 1 tablet (250 mg total) by mouth daily.  30 tablet  2  . traMADol (ULTRAM) 50 MG tablet Take 50 mg by mouth every 6 (six) hours as needed. For pain      . triamterene-hydrochlorothiazide (DYAZIDE) 37.5-25 MG per capsule Take 1  capsule by mouth every other day.        Allergies  Allergen Reactions  . Adhesive (Tape) Rash    History   Social History  . Marital Status: Married    Spouse Name: N/A    Number of Children: N/A  . Years of  Education: N/A   Occupational History  . TRUCK DRIVER    Social History Main Topics  . Smoking status: Former Smoker -- 0.5 packs/day for 35 years    Types: Cigarettes    Quit date: 10/31/2011  . Smokeless tobacco: Never Used  . Alcohol Use: Yes     occasionally  . Drug Use: No  . Sexually Active: Not on file   Other Topics Concern  . Not on file   Social History Narrative   HEAVY SMOKER AND CONTINUES TO SMOKE 1 PPD. DOES NOT EXERCISE REGULARLY. Wife [phone] Lebron Quam). Has 2 sons and daughter. Still active    Family History  Problem Relation Age of Onset  . Heart disease Father   . Diabetes Father   . Cancer Father     PROSTATE  . Arthritis      GRANDMOTHER  . Hypertension      OTHER FAMILY MEMBERS  . Colon cancer Brother     Review of Systems:  As stated in the HPI and otherwise negative.   BP 139/78  Pulse 71  Ht 6\' 2"  (1.88 m)  Wt 257 lb (116.574 kg)  BMI 33.00 kg/m2  Physical Examination: General: Well developed, well nourished, NAD HEENT: OP clear, mucus membranes moist SKIN: warm, dry. No rashes. Neuro: No focal deficits Musculoskeletal: Muscle strength 5/5 all ext Psychiatric: Mood and affect normal Neck: No JVD, no carotid bruits, no thyromegaly, no lymphadenopathy. Lungs:Clear bilaterally, no wheezes, rhonci, crackles Cardiovascular: Regular rate and rhythm. No murmurs, gallops or rubs. Abdomen:Soft. Bowel sounds present. Non-tender.  Extremities: Trace bilateral  lower extremity edema. Pulses are 2 + in the left PT and non-palpable in the right DP/PT  Distal Aortogram 03/19/12: Distal aortogram with no evidence of aneurysm or stenosis.  Bilateral common iliac arteries, external iliac arteries and internal iliac arteries  are patent.  The right common femoral artery is patent. The right SFA has mild plaque throughout. None of these stenoses are flow limiting. The right popliteal artery is patent with mild plaque. The right ATA has 99% proximal diffuse stenosis. The right peroneal is patent with mild plaque. The right PTA is occluded proximally.  The left common femoral artery is patent. The left SFA is patent with mild diffuse plaque. The left popliteal atherectomy site is patent with mild stenosis. The left ATA is occluded proximally. The left PTA and peroneal are patent with mild plaque disease.  Impression:  1. Stable atherectomy of left popliteal artery  2. Severe stenosis right ATA  3. Severe stenosis left ATA  Recommendations: His ABI is mildly reduced on the right and normal on the left. Will continue medical therapy. Will check venous dopplers to exclude DVT in the office this week. Will consider referral for below the knee angioplasty if symptoms persist.   Lower extremity venous dopplers 03/21/12: No evidence of DVT  Lower extremity doppler: 03/05/12: Right ABI 0.85, TBI 0.34  Left ABI 1.0, TBI .50.

## 2012-04-07 ENCOUNTER — Other Ambulatory Visit: Payer: Self-pay | Admitting: Neurology

## 2012-04-07 ENCOUNTER — Encounter: Payer: Self-pay | Admitting: Vascular Surgery

## 2012-04-07 DIAGNOSIS — R42 Dizziness and giddiness: Secondary | ICD-10-CM

## 2012-04-07 DIAGNOSIS — E785 Hyperlipidemia, unspecified: Secondary | ICD-10-CM

## 2012-04-07 DIAGNOSIS — G43909 Migraine, unspecified, not intractable, without status migrainosus: Secondary | ICD-10-CM

## 2012-04-07 DIAGNOSIS — I6529 Occlusion and stenosis of unspecified carotid artery: Secondary | ICD-10-CM

## 2012-04-07 DIAGNOSIS — I10 Essential (primary) hypertension: Secondary | ICD-10-CM

## 2012-04-07 DIAGNOSIS — E119 Type 2 diabetes mellitus without complications: Secondary | ICD-10-CM

## 2012-04-08 ENCOUNTER — Ambulatory Visit (INDEPENDENT_AMBULATORY_CARE_PROVIDER_SITE_OTHER): Payer: BC Managed Care – PPO | Admitting: Vascular Surgery

## 2012-04-08 ENCOUNTER — Encounter: Payer: Self-pay | Admitting: Vascular Surgery

## 2012-04-08 VITALS — BP 169/88 | HR 66 | Resp 18 | Ht 74.0 in | Wt 264.5 lb

## 2012-04-08 DIAGNOSIS — I7092 Chronic total occlusion of artery of the extremities: Secondary | ICD-10-CM | POA: Insufficient documentation

## 2012-04-08 NOTE — Progress Notes (Signed)
Subjective:     Patient ID: [name], male   DOB: March 18, 1956, 56 y.o.   MRN: [id number]  HPI this 56 year old male patient is known to me and was referred by Dr. Angelena Form for vascular evaluation of his lower extremities. The patient recently had an angiogram performed. He had 2 toes of his right foot amputated earlier this year by Dr. Georgena Spurling. Patient describes severe discomfort in the right calf after walking about one half block. He occasionally will feel a "knot" in the right calf which protrudes. He has no history of true rest pain or numbness in the foot at rest. He has no recent history of infection cellulitis or gangrene right foot. Angiogram was performed which I have reviewed on 2 occasions. Patient and wife have come today to talk about either having a stent or a bypass performed to take care of the problem.  Past Medical History  Diagnosis Date  . Carotid artery occlusion 11/10/10    LEFT CAROTID ENDARTERECTOMY  . Diabetes mellitus   . Hypertension   . Hyperlipidemia   . COPD (chronic obstructive pulmonary disease)   . DJD (degenerative joint disease)   . GERD (gastroesophageal reflux disease)   . Substernal chest pain   . 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 Slurred speech     AS PER WIFE IN D/C NOTE 11/10/10  . PAD (peripheral artery disease)     Distal aortogram June 2012. Atherectomy left popliteal artery July 2012.   . Barrett's esophagus   . Fatty liver   . Diverticulosis of colon (without mention of hemorrhage)   . Sleep apnea   . Emphysema of lung     History  Substance Use Topics  . Smoking status: Former Smoker -- 0.5 packs/day for 35 years    Types: Cigarettes    Quit date: 10/31/2011  . Smokeless tobacco: Never Used  . Alcohol Use: Yes     occasionally    Family History  Problem Relation Age of Onset  . Heart disease Father   . Diabetes Father   . Cancer Father     PROSTATE  . Arthritis      GRANDMOTHER  . Hypertension      OTHER FAMILY MEMBERS  . Colon cancer Brother     Allergies  Allergen Reactions  . Adhesive (Tape) Rash    Current outpatient prescriptions:ALPRAZolam (XANAX) 0.5 MG tablet, Take 0.5 mg by mouth daily. , Disp: , Rfl: ;  amLODipine-valsartan (EXFORGE) 10-320 MG per tablet, Take 1 tablet by mouth daily.  , Disp: , Rfl: ;  aspirin 81 MG EC tablet, Take 81 mg by mouth 2 (two) times daily. , Disp: , Rfl: ;  atorvastatin (LIPITOR) 40 MG tablet, Take 40 mg by mouth daily. 1 tab daily, Disp: , Rfl:  carvedilol (COREG) 12.5 MG tablet, Take by mouth 2 (two) times daily with a meal. , Disp: , Rfl: ;  Choline Fenofibrate (TRILIPIX) 135 MG capsule, Take 135 mg by mouth daily.  , Disp: , Rfl: ;  clindamycin (CLEOCIN) 300 MG capsule, As directed, Disp: , Rfl: ;  clopidogrel (PLAVIX) 75 MG tablet, 1 tab daily, Disp: , Rfl: ;  fentaNYL (DURAGESIC - DOSED MCG/HR) 25 MCG/HR, Place 1 patch onto the skin every 3 (three) days., Disp: , Rfl:  FLUoxetine (PROZAC) 20 MG capsule, Take 20 mg by mouth daily. , Disp: , Rfl: ;  glyBURIDE-metformin (GLUCOVANCE) 2.5-500 MG per tablet, 2 tabs  two times a day, Disp: , Rfl: ;  insulin glargine (LANTUS) 100 UNIT/ML injection, Inject 50 Units into the skin 2 (two) times daily. , Disp: , Rfl: ;  lansoprazole (PREVACID) 30 MG capsule, Take 30 mg by mouth 2 (two) times daily., Disp: , Rfl:  omega-3 acid ethyl esters (LOVAZA) 1 G capsule, Take 2 g by mouth 2 (two) times daily. , Disp: , Rfl: ;  ondansetron (ZOFRAN-ODT) 4 MG disintegrating tablet, Take 4 mg by mouth Every 6 hours as needed. For nausea, Disp: , Rfl: ;  oxyCODONE-acetaminophen (TYLOX) 5-500 MG per capsule, Take 1 capsule by mouth every 8 (eight) hours as needed. For pain, Disp: , Rfl:  pregabalin (LYRICA) 75 MG capsule, Take 75 mg by mouth 3 (three) times daily. , Disp: , Rfl: ;  rOPINIRole (REQUIP) 2 MG tablet, Take 2 mg by mouth at bedtime.  , Disp: , Rfl: ;  Tamsulosin HCl (FLOMAX) 0.4 MG CAPS, Take 0.4 mg by mouth daily.,  Disp: , Rfl: ;  terbinafine (LAMISIL) 250 MG tablet, Take 1 tablet (250 mg total) by mouth daily., Disp: 30 tablet, Rfl: 2 traMADol (ULTRAM) 50 MG tablet, Take 50 mg by mouth every 6 (six) hours as needed. For pain, Disp: , Rfl: ;  triamterene-hydrochlorothiazide (DYAZIDE) 37.5-25 MG per capsule, Take 1 capsule by mouth every other day., Disp: , Rfl:   BP 169/88  Pulse 66  Resp 18  Ht 6\' 2"  (1.88 m)  Wt 264 lb 8 oz (119.976 kg)  BMI 33.96 kg/m2  Body mass index is 33.96 kg/(m^2).          Review of Systems as chest pain, dyspnea on exertion, PND, orthopnea, hemoptysis. Does have severe back pain which limits his ability to sleep in different positions. Has had severe swelling in his right lower extremity no history of DVT. Denies any neurologic symptoms    Objective:   Physical Exam blood pressure 169/88 heart rate 66 respirations 18 General well-developed well-nourished male no apparent stress alert and oriented x3 3+ carotid pulses palpable no bruits audible Lungs no rhonchi or wheezing Cardiovascular regular and no murmurs carotid pulses 3+ no bruits Right lower extremity  has 2+ edema throughout. He has no palpable pulses in the lower extremities at the ankle or foot level. Right fourth and fifth toe amputations have healed nicely. Left leg is adequately perfused with 0-1+ edema.  I reviewed the angiograms extensively of both lower extremities. The patient has no significant stenosis in the right common femoral, superficial femoral, popliteal, or peroneal arteries. His right PT is occluded. His right AP is severely diseased Left leg has a widely patent common femoral superficial femoral popliteal with two-vessel runoff to the ATV and peroneal     Assessment:     Patient with diabetes mellitus and severe tibial vessel occlusive disease. He has severe limiting claudication. He does have intact one-vessel runoff to the right foot and two-vessel runoff to the left foot.    Plan:      No intervention or bypass is indicated in this diabetic patient who has in line flow in at least one of his tibial vessels bilaterally. I think any intervention would be more likely to cause harm and to help the situation. I discussed with patient and his wife the fact that limb salvage should be the primary issue not treatment of life altering claudication.  Also encouraged another opinion at a local [facility] such as [place] or [place] if the patient desires.  He'll return to see me when necessary basis

## 2012-04-09 ENCOUNTER — Telehealth: Payer: Self-pay | Admitting: *Deleted

## 2012-04-09 NOTE — Telephone Encounter (Signed)
Pt came into office yesterday and walk in form was completed. Pt is requesting referral to [facility] for PAD. I reviewed with Dr. Burt Knack who recommends Dr. Brunetta Jeans at [place] or Dr. Emily Filbert at [place]. I gave this information to pt. Pt aware Dr. Angelena Form is out of office this week. He would like to have referral made to Dr. Sabra Heck in [place]. He states Dr. Angelena Form was going to refer him to [facility] if needed. Pt would like to wait until Dr. Angelena Form back in office for recommendation on MD at [facility].  Pt wants referral to both doctors.

## 2012-04-10 ENCOUNTER — Other Ambulatory Visit: Payer: BC Managed Care – PPO

## 2012-04-13 NOTE — Telephone Encounter (Signed)
[name], I would like to refer him to Brunetta Jeans at [place]. I will discuss with you on Tuesday. I will get his angiogram burned onto a CD to send to Dr. Andree Elk and we can send my notes as well. Gerald Stabs

## 2012-04-14 ENCOUNTER — Ambulatory Visit
Admission: RE | Admit: 2012-04-14 | Discharge: 2012-04-14 | Disposition: A | Discharge status: Home / Self Care | Payer: BC Managed Care – PPO | Source: Ambulatory Visit | Attending: Neurology | Admitting: Neurology

## 2012-04-14 DIAGNOSIS — G43909 Migraine, unspecified, not intractable, without status migrainosus: Secondary | ICD-10-CM

## 2012-04-14 DIAGNOSIS — I6529 Occlusion and stenosis of unspecified carotid artery: Secondary | ICD-10-CM

## 2012-04-14 DIAGNOSIS — E785 Hyperlipidemia, unspecified: Secondary | ICD-10-CM

## 2012-04-14 DIAGNOSIS — R42 Dizziness and giddiness: Secondary | ICD-10-CM

## 2012-04-14 DIAGNOSIS — I10 Essential (primary) hypertension: Secondary | ICD-10-CM

## 2012-04-14 DIAGNOSIS — E119 Type 2 diabetes mellitus without complications: Secondary | ICD-10-CM

## 2012-04-14 MED ORDER — GADOBENATE DIMEGLUMINE 529 MG/ML IV SOLN
20.0000 mL | Freq: Once | INTRAVENOUS | Status: AC | PRN
  Administered 2012-04-14: 20 mL via INTRAVENOUS

## 2012-04-14 NOTE — Telephone Encounter (Signed)
I spoke to the patient today. I told him that I would like to refer him to Dr. Brunetta Jeans at [facility] in [place]. Fraser Din, can we talk about this on Tuesday. I have sent an email to Dr. Andree Elk to get his referral contact info. I will get his cath films to together this week. Thanks, chris

## 2012-04-15 ENCOUNTER — Ambulatory Visit: Payer: BC Managed Care – PPO | Admitting: Cardiovascular Disease

## 2012-04-21 ENCOUNTER — Telehealth: Payer: Self-pay | Admitting: Cardiovascular Disease

## 2012-04-21 NOTE — Telephone Encounter (Signed)
Dr. Angelena Form spoke with pt's wife today and told her referral has been made to [facility] and that pt's records and films would be at our office for pt to pick up.  Dr. Andree Elk' office to contact pt with appt time.  Paperwork and films left at front desk.

## 2012-04-21 NOTE — Telephone Encounter (Signed)
Called mr. Valera this am and spoke to wife. Dr. Brunetta Jeans called me this am and will see pt in [place]. He is at [employer]. Will call pt. cdm

## 2012-04-25 DIAGNOSIS — IMO0002 Reserved for concepts with insufficient information to code with codable children: Secondary | ICD-10-CM | POA: Insufficient documentation

## 2012-04-25 DIAGNOSIS — J449 Chronic obstructive pulmonary disease, unspecified: Secondary | ICD-10-CM | POA: Insufficient documentation

## 2012-06-09 ENCOUNTER — Telehealth: Payer: Self-pay | Admitting: *Deleted

## 2012-06-09 NOTE — Telephone Encounter (Signed)
Message copied by Lance Morin on Mon Jun 09, 2012  4:54 PM ------      Message from: Lance Morin      Created: Fri Jun 08, 2011  8:47 AM      Regarding: 1 year f/u appt       Needs 1 year f/u per Dr Hilarie Fredrickson.

## 2012-06-12 NOTE — Telephone Encounter (Signed)
Unable to reach pt by phone; mailed him a letter asking him to call to schedule an appt.

## 2012-06-23 ENCOUNTER — Encounter: Payer: Self-pay | Admitting: Internal Medicine

## 2012-06-24 ENCOUNTER — Ambulatory Visit: Payer: BC Managed Care – PPO | Admitting: Internal Medicine

## 2012-06-27 ENCOUNTER — Emergency Department (HOSPITAL_COMMUNITY)
Admission: EM | Admit: 2012-06-27 | Discharge: 2012-06-28 | Disposition: A | Discharge status: Home / Self Care | Payer: BC Managed Care – PPO | Attending: Emergency Medicine | Admitting: Emergency Medicine

## 2012-06-27 ENCOUNTER — Telehealth: Payer: Self-pay | Admitting: Cardiovascular Disease

## 2012-06-27 ENCOUNTER — Encounter (HOSPITAL_COMMUNITY): Payer: Self-pay | Admitting: Emergency Medicine

## 2012-06-27 DIAGNOSIS — J4489 Other specified chronic obstructive pulmonary disease: Secondary | ICD-10-CM | POA: Insufficient documentation

## 2012-06-27 DIAGNOSIS — Z79899 Other long term (current) drug therapy: Secondary | ICD-10-CM | POA: Insufficient documentation

## 2012-06-27 DIAGNOSIS — Z794 Long term (current) use of insulin: Secondary | ICD-10-CM | POA: Insufficient documentation

## 2012-06-27 DIAGNOSIS — G4730 Sleep apnea, unspecified: Secondary | ICD-10-CM | POA: Insufficient documentation

## 2012-06-27 DIAGNOSIS — E119 Type 2 diabetes mellitus without complications: Secondary | ICD-10-CM | POA: Insufficient documentation

## 2012-06-27 DIAGNOSIS — J449 Chronic obstructive pulmonary disease, unspecified: Secondary | ICD-10-CM | POA: Insufficient documentation

## 2012-06-27 DIAGNOSIS — I10 Essential (primary) hypertension: Secondary | ICD-10-CM | POA: Insufficient documentation

## 2012-06-27 DIAGNOSIS — E785 Hyperlipidemia, unspecified: Secondary | ICD-10-CM | POA: Insufficient documentation

## 2012-06-27 DIAGNOSIS — K219 Gastro-esophageal reflux disease without esophagitis: Secondary | ICD-10-CM | POA: Insufficient documentation

## 2012-06-27 DIAGNOSIS — M79605 Pain in left leg: Secondary | ICD-10-CM

## 2012-06-27 DIAGNOSIS — Z87891 Personal history of nicotine dependence: Secondary | ICD-10-CM | POA: Insufficient documentation

## 2012-06-27 DIAGNOSIS — M79609 Pain in unspecified limb: Secondary | ICD-10-CM | POA: Insufficient documentation

## 2012-06-27 LAB — BASIC METABOLIC PANEL
BUN: 10 mg/dL (ref 6–23)
CO2: 31 mEq/L (ref 19–32)
Calcium: 9.5 mg/dL (ref 8.4–10.5)
Chloride: 94 mEq/L — ABNORMAL LOW (ref 96–112)
Creatinine, Ser: 0.82 mg/dL (ref 0.50–1.35)
GFR calc Af Amer: >90 mL/min (ref >90)
GFR calc non Af Amer: >90 mL/min (ref >90)
Glucose, Bld: 208 mg/dL — ABNORMAL HIGH (ref 70–99)
Potassium: 3.5 mEq/L (ref 3.5–5.1)
Sodium: 134 mEq/L — ABNORMAL LOW (ref 135–145)

## 2012-06-27 LAB — CBC WITH DIFFERENTIAL/PLATELET
Basophils Absolute: 0.1 10*3/uL (ref 0.0–0.1)
Basophils Relative: 1 % (ref 0–1)
Eosinophils Absolute: 0.2 10*3/uL (ref 0.0–0.7)
Eosinophils Relative: 3 % (ref 0–5)
HCT: 35.6 % — ABNORMAL LOW (ref 39.0–52.0)
Hemoglobin: 13.2 g/dL (ref 13.0–17.0)
Lymphocytes Relative: 28 % (ref 12–46)
Lymphs Abs: 1.9 10*3/uL (ref 0.7–4.0)
MCH: 30.3 pg (ref 26.0–34.0)
MCHC: 37.1 g/dL — ABNORMAL HIGH (ref 30.0–36.0)
MCV: 81.8 fL (ref 78.0–100.0)
Monocytes Absolute: 0.6 10*3/uL (ref 0.1–1.0)
Monocytes Relative: 9 % (ref 3–12)
Neutro Abs: 4 10*3/uL (ref 1.7–7.7)
Neutrophils Relative %: 59 % (ref 43–77)
Platelets: 195 10*3/uL (ref 150–400)
RBC: 4.35 MIL/uL (ref 4.22–5.81)
RDW: 12.5 % (ref 11.5–15.5)
WBC: 6.8 10*3/uL (ref 4.0–10.5)

## 2012-06-27 LAB — PROTIME-INR
INR: 1.01 (ref 0.00–1.49)
Prothrombin Time: 13.2 seconds (ref 11.6–15.2)

## 2012-06-27 MED ORDER — MORPHINE SULFATE 4 MG/ML IJ SOLN
4.0000 mg | Freq: Once | INTRAMUSCULAR | Status: AC
  Administered 2012-06-27: 4 mg via INTRAVENOUS
  Filled 2012-06-27: qty 1

## 2012-06-27 MED ORDER — SODIUM CHLORIDE 0.9 % IV SOLN
INTRAVENOUS | Status: DC
  Administered 2012-06-27: via INTRAVENOUS

## 2012-06-27 NOTE — ED Notes (Signed)
Reports had stent placed in L leg d/t blockages about 3 weeks ago, now having pain to calf and swelling; pt has pain patch and took 2 tylox with no relief; pt CMS intact, + DP pulse

## 2012-06-27 NOTE — ED Notes (Signed)
Patient presents with c/o large hard area to the back of his left calf.  Dorsalis pulses marked and heard with doppler, posterior tibial pulses marked and felt.  Popliteal pulses difficult too palpate.  Patient states his left leg is painful when he moves it or walks on it.

## 2012-06-27 NOTE — Telephone Encounter (Signed)
Reviewed with Dr.McAlhany who recommends pt go to Urgent Care to have this evaluated.  I spoke with pt and gave him this information.  I told him there is an Urgent Care associated with [facility] that is located on campus of [facility].  He states he will go there to be evaluated.

## 2012-06-27 NOTE — Telephone Encounter (Signed)
Spoke with pt who reports he had recent stent procedure done by Dr. Andree Elk in [place] on left leg. Procedure done 3-4 weeks ago.  He is calling today with complaint of knot in back of his left leg that has been present for about a week. Painful and hurts more when he walks.  Thinks it is in a muscle. Has not called Dr. Andree Elk' office. Will review with Dr. Angelena Form

## 2012-06-27 NOTE — Telephone Encounter (Signed)
New Problem:    Patient called in because the muscle in the back of his left leg has a knot in it, is hurting to the point where he can barely walk on it, and he wanted to know if he could be seen today to have something prescribed for it.  Please call back.

## 2012-06-27 NOTE — ED Provider Notes (Signed)
History     CSN: [id number]  Arrival date & time 06/27/12  1858   First MD Initiated Contact with Patient 06/27/12 2047      Chief Complaint  Patient presents with  . Leg Swelling    (Consider location/radiation/quality/duration/timing/severity/associated sxs/prior treatment) Patient is a 56 y.o. male presenting with leg pain. The history is provided by the patient.  Leg Pain  The incident occurred 2 days ago. The incident occurred at home. There was no injury mechanism. The pain is present in the left leg (left calf). The quality of the pain is described as throbbing. The pain is at a severity of 10/10. The pain is severe. The pain has been constant (gets worse upon first standing and then improves slightly) since onset. Associated symptoms include numbness (chronic, bilateral, unchanged). Pertinent negatives include no inability to bear weight, no loss of motion, no muscle weakness, no loss of sensation and no tingling. He reports no foreign bodies present. The symptoms are aggravated by activity, palpation and bearing weight. He has tried nothing for the symptoms. The treatment provided no relief.    Past Medical History  Diagnosis Date  . Carotid artery occlusion 11/10/10    LEFT CAROTID ENDARTERECTOMY  . Diabetes mellitus   . Hypertension   . Hyperlipidemia   . COPD (chronic obstructive pulmonary disease)   . DJD (degenerative joint disease)   . GERD (gastroesophageal reflux disease)   . Substernal chest pain   . 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 Slurred speech     AS PER WIFE IN D/C NOTE 11/10/10  . PAD (peripheral artery disease)     Distal aortogram June 2012. Atherectomy left popliteal artery July 2012.   . Barrett's esophagus   . Fatty liver   . Diverticulosis of colon (without mention of hemorrhage)   . Sleep apnea   . Emphysema of lung     Past Surgical History  Procedure Date  . Laminectomy     X 3 LUMBAR AND X 2 CERVICAL SPINE  OPERATIONS  . Carotid endarterectomy 11/10/10  . Hernia repair     LEFT INGUINAL AND UMBILICAL REPAIRS  . Anterior fusion cervical spine 02/06/06    C4-5, C5-6, C6-7; SURGEON DR. MAX COHEN  . Penile prosthesis implant 08/14/05    INFRAPUBIC INSERTION OF INFLATABLE PENILE PROSTHESIS; SURGEON DR. Amalia Hailey  . Cardiac catheterization 10/31/04  . Laparoscopic cholecystectomy w/ cholangiography 11/09/04    SURGEON DR. Luella Cook  . Total knee arthroplasty 07/2002    RIGHT KNEE ; SURGEON  DR. [name] ALSO HAD ARTHROSCOPIC RIGHT KNEE IN  10/2001  . Shoulder arthroscopy   . Cholecystectomy   . Amputation 11/05/2011    Procedure: AMPUTATION RAY;  Surgeon: Wylene Simmer, MD;  Location: [place];  Service: Orthopedics;  Laterality: Right;  Amputation of Right 4&5th Toes    Family History  Problem Relation Age of Onset  . Heart disease Father   . Diabetes Father   . Cancer Father     PROSTATE  . Arthritis      GRANDMOTHER  . Hypertension      OTHER FAMILY MEMBERS  . Colon cancer Brother     History  Substance Use Topics  . Smoking status: Former Smoker -- 0.5 packs/day for 35 years    Types: Cigarettes    Quit date: 10/31/2011  . Smokeless tobacco: Never Used  . Alcohol Use: Yes     occasionally  Review of Systems  Constitutional: Negative for fever.  Respiratory: Negative for cough and shortness of breath.   Cardiovascular: Positive for leg swelling (intermittent, not currently swollen). Negative for chest pain.  Gastrointestinal: Negative for nausea, vomiting, abdominal pain and diarrhea.  Neurological: Positive for numbness (chronic, bilateral, unchanged). Negative for tingling and headaches.  All other systems reviewed and are negative.    Allergies  Adhesive  Home Medications   Current Outpatient Rx  Name Route Sig Dispense Refill  . ALPRAZOLAM 0.5 MG PO TABS Oral Take 0.5 mg by mouth daily.     Marland Kitchen AMLODIPINE BESYLATE-VALSARTAN 10-320 MG PO TABS Oral Take 1 tablet by mouth  daily.     . ASPIRIN 81 MG PO TBEC Oral Take 81 mg by mouth 2 (two) times daily.     . ATORVASTATIN CALCIUM 40 MG PO TABS Oral Take 40 mg by mouth daily. 1 tab daily    . CARVEDILOL 12.5 MG PO TABS Oral Take 12.5 mg by mouth 2 (two) times daily with a meal.     . CHOLINE FENOFIBRATE 135 MG PO CPDR Oral Take 135 mg by mouth daily.     Marland Kitchen CLOPIDOGREL BISULFATE 75 MG PO TABS  1 tab daily    . FENTANYL 25 MCG/HR TD PT72 Transdermal Place 1 patch onto the skin every 3 (three) days.    Marland Kitchen FLUOXETINE HCL 20 MG PO CAPS Oral Take 20 mg by mouth daily.     . GLYBURIDE-METFORMIN 2.5-500 MG PO TABS  2 tabs two times a day    . INSULIN GLARGINE 100 UNIT/ML [place] SOLN Subcutaneous Inject 50 Units into the skin 2 (two) times daily.     . OMEGA-3-ACID ETHYL ESTERS 1 G PO CAPS Oral Take 2 g by mouth 2 (two) times daily.     Marland Kitchen ONDANSETRON 4 MG PO TBDP Oral Take 4 mg by mouth Every 6 hours as needed. For nausea    . OXYCODONE-ACETAMINOPHEN 5-500 MG PO CAPS Oral Take 2 capsules by mouth every 8 (eight) hours as needed. For pain    . PREGABALIN 75 MG PO CAPS Oral Take 75 mg by mouth 3 (three) times daily.     Marland Kitchen ROPINIROLE HCL 2 MG PO TABS Oral Take 2 mg by mouth at bedtime.     . TAMSULOSIN HCL 0.4 MG PO CAPS Oral Take 0.4 mg by mouth daily.    . TERBINAFINE HCL 250 MG PO TABS Oral Take 250 mg by mouth daily.    . TRAMADOL HCL 50 MG PO TABS Oral Take 50 mg by mouth every 6 (six) hours as needed. For pain    . TRIAMTERENE-HCTZ 37.5-25 MG PO CAPS Oral Take 1 capsule by mouth every other day.    Marland Kitchen LANSOPRAZOLE 30 MG PO CPDR Oral Take 30 mg by mouth 2 (two) times daily.      BP 156/85  Pulse 92  Temp 98.7 F (37.1 C) (Oral)  Resp 20  SpO2 98%  Physical Exam  Nursing note and vitals reviewed. Constitutional: He is oriented to person, place, and time. He appears well-developed and well-nourished. No distress.  HENT:  Head: Normocephalic and atraumatic.  Eyes: Pupils are equal, round, and reactive to light.    Cardiovascular: Normal rate and normal heart sounds.   Pulmonary/Chest: Effort normal and breath sounds normal. No respiratory distress.  Abdominal: Soft. He exhibits no distension. There is no tenderness.  Musculoskeletal: Normal range of motion. He exhibits edema (bilateral legs, mild).  Legs: Neurological: He is alert and oriented to person, place, and time.  Skin: Skin is warm and dry.  Psychiatric: He has a normal mood and affect.    ED Course  Procedures (including critical care time)  Labs Reviewed  BASIC METABOLIC PANEL - Abnormal; Notable for the following:    Sodium 134 (*)     Chloride 94 (*)     Glucose, Bld 208 (*)     All other components within normal limits  CBC WITH DIFFERENTIAL - Abnormal; Notable for the following:    HCT 35.6 (*)     MCHC 37.1 (*)  RULED OUT INTERFERING SUBSTANCES   All other components within normal limits  PROTIME-INR  CK   No results found.   1. Left leg pain       MDM  8:47 PM Pt seen and examined. Pt with 2-3 days of increasing left calf pain that is ttp and worse when first attempting to ambulate. No evidence of cellulitis. Concern for spasm v DVT v arterial problem. Intact DP pulse, proably only 1+ but is the same as the other side.  11:15 PM I spoke to the patient's vascular surgeon at [facility], Dr. Brunetta Jeans [phone]). He is comfortable sending the patient home if CK and potassium levels are normal. He doubts that this is an arterial issue as patient has good distal perfusion. Pt to return in AM for [place]. Will send home with pain medicine if all labs ok.  12:29 AM Labs unremarkable. Will discharge home after last dose of pain medicine. Pt will return tomorrow for imaging studies.  Tania Ade, MD 06/28/12 0030  Tania Ade, MD 06/28/12 [phone]

## 2012-06-27 NOTE — ED Provider Notes (Signed)
Few weeks ago stent left leg at [place] for left calf pain claudication, now few days tender left posterior calf area 24/7 without redness or new swelling, baseline edema unchanged legs no asymmetry, baseline numb neuropathy feet unchanged, no left thigh/foot pain or color change to left leg, no left leg weakness, left foot intact DP pulse and CR<2secs warm toes with good movement, no CP/SOB.  Babette Relic, MD 06/28/12 1700

## 2012-06-28 ENCOUNTER — Encounter (HOSPITAL_COMMUNITY): Payer: Self-pay | Admitting: *Deleted

## 2012-06-28 ENCOUNTER — Emergency Department (HOSPITAL_COMMUNITY)
Admission: EM | Admit: 2012-06-28 | Discharge: 2012-06-28 | Disposition: A | Discharge status: Home / Self Care | Payer: BC Managed Care – PPO | Attending: Emergency Medicine | Admitting: Emergency Medicine

## 2012-06-28 ENCOUNTER — Ambulatory Visit (HOSPITAL_COMMUNITY)
Admission: RE | Admit: 2012-06-28 | Discharge: 2012-06-28 | Payer: BC Managed Care – PPO | Source: Ambulatory Visit | Attending: Emergency Medicine | Admitting: Emergency Medicine

## 2012-06-28 ENCOUNTER — Ambulatory Visit (HOSPITAL_COMMUNITY)
Admission: RE | Admit: 2012-06-28 | Discharge: 2012-06-28 | Disposition: A | Discharge status: Home / Self Care | Payer: BC Managed Care – PPO | Source: Ambulatory Visit | Attending: Emergency Medicine | Admitting: Emergency Medicine

## 2012-06-28 DIAGNOSIS — M1990 Unspecified osteoarthritis, unspecified site: Secondary | ICD-10-CM | POA: Insufficient documentation

## 2012-06-28 DIAGNOSIS — Z79899 Other long term (current) drug therapy: Secondary | ICD-10-CM | POA: Insufficient documentation

## 2012-06-28 DIAGNOSIS — Z981 Arthrodesis status: Secondary | ICD-10-CM | POA: Insufficient documentation

## 2012-06-28 DIAGNOSIS — Z794 Long term (current) use of insulin: Secondary | ICD-10-CM | POA: Insufficient documentation

## 2012-06-28 DIAGNOSIS — I824Z9 Acute embolism and thrombosis of unspecified deep veins of unspecified distal lower extremity: Secondary | ICD-10-CM | POA: Insufficient documentation

## 2012-06-28 DIAGNOSIS — I739 Peripheral vascular disease, unspecified: Secondary | ICD-10-CM | POA: Insufficient documentation

## 2012-06-28 DIAGNOSIS — I10 Essential (primary) hypertension: Secondary | ICD-10-CM | POA: Insufficient documentation

## 2012-06-28 DIAGNOSIS — E785 Hyperlipidemia, unspecified: Secondary | ICD-10-CM | POA: Insufficient documentation

## 2012-06-28 DIAGNOSIS — I82409 Acute embolism and thrombosis of unspecified deep veins of unspecified lower extremity: Secondary | ICD-10-CM

## 2012-06-28 DIAGNOSIS — Z9109 Other allergy status, other than to drugs and biological substances: Secondary | ICD-10-CM | POA: Insufficient documentation

## 2012-06-28 DIAGNOSIS — M79609 Pain in unspecified limb: Secondary | ICD-10-CM

## 2012-06-28 DIAGNOSIS — Z7982 Long term (current) use of aspirin: Secondary | ICD-10-CM | POA: Insufficient documentation

## 2012-06-28 DIAGNOSIS — J438 Other emphysema: Secondary | ICD-10-CM | POA: Insufficient documentation

## 2012-06-28 DIAGNOSIS — K219 Gastro-esophageal reflux disease without esophagitis: Secondary | ICD-10-CM | POA: Insufficient documentation

## 2012-06-28 DIAGNOSIS — E119 Type 2 diabetes mellitus without complications: Secondary | ICD-10-CM | POA: Insufficient documentation

## 2012-06-28 DIAGNOSIS — Z87891 Personal history of nicotine dependence: Secondary | ICD-10-CM | POA: Insufficient documentation

## 2012-06-28 LAB — PROTIME-INR
INR: 0.99 (ref 0.00–1.49)
Prothrombin Time: 13 seconds (ref 11.6–15.2)

## 2012-06-28 LAB — APTT: aPTT: 28 seconds (ref 24–37)

## 2012-06-28 LAB — CK: Total CK: 101 U/L (ref 7–232)

## 2012-06-28 MED ORDER — RIVAROXABAN 15 MG PO TABS
15.0000 mg | ORAL_TABLET | Freq: Once | ORAL | Status: AC
  Administered 2012-06-28: 15 mg via ORAL
  Filled 2012-06-28: qty 1

## 2012-06-28 MED ORDER — PERCOCET 5-325 MG PO TABS: 1.0000 | ORAL_TABLET | Freq: Four times a day (QID) | ORAL | Status: DC | PRN

## 2012-06-28 MED ORDER — HYDROMORPHONE HCL PF 2 MG/ML IJ SOLN
2.0000 mg | Freq: Once | INTRAMUSCULAR | Status: AC
  Administered 2012-06-28: 2 mg via INTRAMUSCULAR
  Filled 2012-06-28: qty 1

## 2012-06-28 MED ORDER — RIVAROXABAN 15 MG PO TABS: 15.0000 mg | ORAL_TABLET | Freq: Two times a day (BID) | ORAL | Status: DC

## 2012-06-28 MED ORDER — ENOXAPARIN SODIUM 120 MG/0.8ML ~~LOC~~ SOLN
110.0000 mg | Freq: Once | SUBCUTANEOUS | Status: DC
  Filled 2012-06-28: qty 0.8

## 2012-06-28 MED ORDER — ENOXAPARIN SODIUM 120 MG/0.8ML ~~LOC~~ SOLN: 110.0000 mg | Freq: Two times a day (BID) | SUBCUTANEOUS | Status: DC

## 2012-06-28 MED ORDER — HYDROMORPHONE HCL PF 1 MG/ML IJ SOLN
1.0000 mg | Freq: Once | INTRAMUSCULAR | Status: AC
  Administered 2012-06-28: 1 mg via INTRAVENOUS
  Filled 2012-06-28: qty 1

## 2012-06-28 MED ORDER — ENOXAPARIN SODIUM 120 MG/0.8ML ~~LOC~~ SOLN
110.0000 mg | SUBCUTANEOUS | Status: DC
  Filled 2012-06-28: qty 0.8

## 2012-06-28 NOTE — Progress Notes (Signed)
-  Spoke with ED Harold about [name]. -After discussing the case with  [name]. She confirm they was no sign of PE. Provided the patient has been in rule out by the ED Harold for PE. The patient can be treat as an out patient and follow up with PCP. -Doppler of the lower extremities was positive for DVT. The patient is not tachycardic is not hypoxic does not complain of any chest pain. -Current dose of Xarelto is 15 mg by mouth twice a day for 21 days. Then 20 mg PO daily, at least for 3-6 months.

## 2012-06-28 NOTE — ED Provider Notes (Signed)
I saw and evaluated the patient, reviewed the resident's note and I agree with the findings and plan.  Babette Relic, MD 06/28/12 [phone]

## 2012-06-28 NOTE — Progress Notes (Signed)
VASCULAR LAB PRELIMINARY  PRELIMINARY  PRELIMINARY  PRELIMINARY  Left lower extremity venous Doppler and arterial duplex completed.    Preliminary report:  There is acute occlusive DVT noted in the left peroneal vein.  Duplex scan of left lower extremity arteries reveal adequate blood flow throughout the left lower extremity.  Nijah Tejera, 06/28/2012, 12:08 PM

## 2012-06-28 NOTE — ED Provider Notes (Signed)
History     CSN: [id number]  Arrival date & time 06/28/12  1207   First MD Initiated Contact with Patient 06/28/12 1335      Chief Complaint  Patient presents with  . DVT    (Consider location/radiation/quality/duration/timing/severity/associated sxs/prior treatment) HPI Comments: Pt to ER for US doppler r/o of LLE DVT for leg pain onset 2 days ago. Pain is described as mod-severe throbbing worse with ambulation. Pt denies any CP, SOB, palpitations, pleurisy, cough or hemoptysis. Pt denies a hx of PE.   Vascular surgeon: [facility], Dr. Brunetta Jeans [phone]).   The history is provided by the patient.    Past Medical History  Diagnosis Date  . Carotid artery occlusion 11/10/10    LEFT CAROTID ENDARTERECTOMY  . Diabetes mellitus   . Hypertension   . Hyperlipidemia   . COPD (chronic obstructive pulmonary disease)   . DJD (degenerative joint disease)   . GERD (gastroesophageal reflux disease)   . Substernal chest pain   . 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 Slurred speech     AS PER WIFE IN D/C NOTE 11/10/10  . PAD (peripheral artery disease)     Distal aortogram June 2012. Atherectomy left popliteal artery July 2012.   . Barrett's esophagus   . Fatty liver   . Diverticulosis of colon (without mention of hemorrhage)   . Sleep apnea   . Emphysema of lung     Past Surgical History  Procedure Date  . Laminectomy     X 3 LUMBAR AND X 2 CERVICAL SPINE OPERATIONS  . Carotid endarterectomy 11/10/10  . Hernia repair     LEFT INGUINAL AND UMBILICAL REPAIRS  . Anterior fusion cervical spine 02/06/06    C4-5, C5-6, C6-7; SURGEON DR. MAX COHEN  . Penile prosthesis implant 08/14/05    INFRAPUBIC INSERTION OF INFLATABLE PENILE PROSTHESIS; SURGEON DR. Amalia Hailey  . Cardiac catheterization 10/31/04  . Laparoscopic cholecystectomy w/ cholangiography 11/09/04    SURGEON DR. Luella Cook  . Total knee arthroplasty 07/2002    RIGHT KNEE ; SURGEON  DR.  [name] ALSO HAD ARTHROSCOPIC RIGHT KNEE IN  10/2001  . Shoulder arthroscopy   . Cholecystectomy   . Amputation 11/05/2011    Procedure: AMPUTATION RAY;  Surgeon: Wylene Simmer, MD;  Location: [place];  Service: Orthopedics;  Laterality: Right;  Amputation of Right 4&5th Toes    Family History  Problem Relation Age of Onset  . Heart disease Father   . Diabetes Father   . Cancer Father     PROSTATE  . Arthritis      GRANDMOTHER  . Hypertension      OTHER FAMILY MEMBERS  . Colon cancer Brother     History  Substance Use Topics  . Smoking status: Former Smoker -- 0.5 packs/day for 35 years    Types: Cigarettes    Quit date: 10/31/2011  . Smokeless tobacco: Never Used  . Alcohol Use: Yes     occasionally      Review of Systems  All other systems reviewed and are negative.    Allergies  Adhesive  Home Medications   Current Outpatient Rx  Name Route Sig Dispense Refill  . ALPRAZOLAM 0.5 MG PO TABS Oral Take 0.5 mg by mouth daily.     Marland Kitchen AMLODIPINE BESYLATE-VALSARTAN 10-320 MG PO TABS Oral Take 1 tablet by mouth daily.     . ASPIRIN 81 MG PO TBEC Oral Take  81 mg by mouth 2 (two) times daily.     . ATORVASTATIN CALCIUM 40 MG PO TABS Oral Take 40 mg by mouth daily. 1 tab daily    . CARVEDILOL 12.5 MG PO TABS Oral Take 12.5 mg by mouth 2 (two) times daily with a meal.     . CHOLINE FENOFIBRATE 135 MG PO CPDR Oral Take 135 mg by mouth daily.     Marland Kitchen CLOPIDOGREL BISULFATE 75 MG PO TABS  1 tab daily    . FENTANYL 25 MCG/HR TD PT72 Transdermal Place 1 patch onto the skin every 3 (three) days.    Marland Kitchen FLUOXETINE HCL 20 MG PO CAPS Oral Take 20 mg by mouth daily.     . GLYBURIDE-METFORMIN 2.5-500 MG PO TABS  2 tabs two times a day    . INSULIN GLARGINE 100 UNIT/ML [place] SOLN Subcutaneous Inject 50 Units into the skin 2 (two) times daily.     . OMEGA-3-ACID ETHYL ESTERS 1 G PO CAPS Oral Take 2 g by mouth 2 (two) times daily.     Marland Kitchen ONDANSETRON 4 MG PO TBDP Oral Take 4 mg by mouth Every 6 hours  as needed. For nausea    . OXYCODONE-ACETAMINOPHEN 5-500 MG PO CAPS Oral Take 2 capsules by mouth every 8 (eight) hours as needed. For pain    . PREGABALIN 75 MG PO CAPS Oral Take 75 mg by mouth 3 (three) times daily.     Marland Kitchen ROPINIROLE HCL 2 MG PO TABS Oral Take 2 mg by mouth at bedtime.     . TAMSULOSIN HCL 0.4 MG PO CAPS Oral Take 0.4 mg by mouth daily.    . TRAMADOL HCL 50 MG PO TABS Oral Take 50 mg by mouth every 6 (six) hours as needed. For pain    . TRIAMTERENE-HCTZ 37.5-25 MG PO CAPS Oral Take 1 capsule by mouth every other day.      BP 147/78  Pulse 80  Resp 20  SpO2 98%  Physical Exam  Nursing note and vitals reviewed. Constitutional: He is oriented to person, place, and time. He appears well-developed and well-nourished. No distress.  HENT:  Head: Normocephalic and atraumatic.  Eyes: Conjunctivae normal and EOM are normal.  Neck: Normal range of motion.  Cardiovascular:       Regular rate and rhythm, no aberrancy and auscultation.  Bilateral edema.  Intact distal pulses.  Capillary refill less than 3 seconds  Pulmonary/Chest: Effort normal.       LCAB, no respiratory distress.  Musculoskeletal: Normal range of motion.       Left calf tenderness.  Neurological: He is alert and oriented to person, place, and time.  Skin: Skin is warm and dry. No rash noted. He is not diaphoretic.  Psychiatric: He has a normal mood and affect. His behavior is normal.    ED Course  Procedures (including critical care time)   Labs Reviewed  PROTIME-INR  APTT   No results found.   No diagnosis found.  VASCULAR LAB  PRELIMINARY PRELIMINARY PRELIMINARY PRELIMINARY  Left lower extremity venous Doppler and arterial duplex completed.  Preliminary report: There is acute occlusive DVT noted in the left peroneal vein. Duplex scan of left lower extremity arteries reveal adequate blood flow throughout the left lower extremity.  KANADY, CANDACE,  06/28/2012, 12:08 PM  Consult    Hospitalist: Discussed Xeralto criteria and pt is a good candidate.  Reccommended 15mg  BID x 21 days, then 20 mg daily x 3 months.  Case Manager: Medication cost reduction card   MDM  DVT Patient is a 56 year old male diagnosed via ultrasound for a DVT.  He has no symptoms concerning for PE & normal vital signs.  Patient will be discharged with [facility] as above.  Recommended followup with his primary care provider in one week to evaluate pain severity as well as followup with his vascular surgeon.  At this time there does not appear to be any evidence of an acute emergency medical condition and the patient appears stable for discharge with appropriate outpatient follow up.Diagnosis was discussed with patient who verbalizes understanding and is agreeable to discharge. Pt case discussed with Dr. Christy Gentles who agrees with my plan.          Verl Dicker, [place] 06/28/12 1513

## 2012-06-28 NOTE — Progress Notes (Signed)
NCM spoke to pt's wife and gave info on Xarelto. Provided 10 day free trial card and copay assistance card with drug info included explaining side effects and when to call physician. Explained cards can be activated on 10/21. NCM did follow up with pt's pharmacy [facility], [phone]. Spoke to pharmacist and they close today at 6pm. They do have Xarelto in stock. Explained pt had trial card. They will make sure pt has med for tonight and tomorrow. Jonnie Finner RN CCM Case Mgmt phone [phone]

## 2012-06-28 NOTE — Progress Notes (Signed)
ANTICOAGULATION CONSULT NOTE - Initial Consult  Pharmacy Consult for lovenox Indication: DVT  Allergies  Allergen Reactions  . Adhesive (Tape) Rash   Vital Signs: Temp src: Oral (10/19 1208) BP: 147/78 mmHg (10/19 1208) Pulse Rate: 80  (10/19 1208)  Labs:  Basename 06/28/12 1215 06/27/12 2339 06/27/12 2238  HGB -- -- 13.2  HCT -- -- 35.6*  PLT -- -- 195  APTT 28 -- --  LABPROT 13.0 -- 13.2  INR 0.99 -- 1.01  HEPARINUNFRC -- -- --  CREATININE -- -- 0.82  CKTOTAL -- 101 --  CKMB -- -- --  TROPONINI -- -- --    The CrCl is unknown because both a height and weight (above a minimum accepted value) are required for this calculation.   Medical History: Past Medical History  Diagnosis Date  . Carotid artery occlusion 11/10/10    LEFT CAROTID ENDARTERECTOMY  . Diabetes mellitus   . Hypertension   . Hyperlipidemia   . COPD (chronic obstructive pulmonary disease)   . DJD (degenerative joint disease)   . GERD (gastroesophageal reflux disease)   . Substernal chest pain   . 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 Slurred speech     AS PER WIFE IN D/C NOTE 11/10/10  . PAD (peripheral artery disease)     Distal aortogram June 2012. Atherectomy left popliteal artery July 2012.   . Barrett's esophagus   . Fatty liver   . Diverticulosis of colon (without mention of hemorrhage)   . Sleep apnea   . Emphysema of lung     Medications:  Scheduled:    .  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 Doppler revealed he has a DVT in the left leg. Full dose lovenox was ordered for anticoagulation.   Goal of Therapy:  Anti-Xa level 0.6-1.2 units/ml 4hrs after LMWH dose given Monitor platelets by anticoagulation protocol: Yes   Plan:  Lovenox 110mg  SQ q12 CBC q3days  Onnie Boer [place] 06/28/2012,2:31 PM

## 2012-06-28 NOTE — ED Notes (Signed)
Pt seen last night for DVT.  [place] today confirmed that pt does have a DVT.  Here for treatment.

## 2012-06-29 NOTE — ED Provider Notes (Signed)
Medical screening examination/treatment/procedure(s) were conducted as a shared visit with non-physician practitioner(s) and myself.  I personally evaluated the patient during the encounter  Pt well appearing, no distress, appropriate for outpatient management   Sharyon Cable, MD 06/29/12 [phone]

## 2012-07-03 ENCOUNTER — Ambulatory Visit (INDEPENDENT_AMBULATORY_CARE_PROVIDER_SITE_OTHER): Payer: BC Managed Care – PPO | Admitting: Cardiovascular Disease

## 2012-07-03 ENCOUNTER — Encounter: Payer: Self-pay | Admitting: Cardiovascular Disease

## 2012-07-03 VITALS — BP 150/90 | HR 84 | Ht 74.0 in | Wt 241.0 lb

## 2012-07-03 DIAGNOSIS — I82409 Acute embolism and thrombosis of unspecified deep veins of unspecified lower extremity: Secondary | ICD-10-CM

## 2012-07-03 DIAGNOSIS — I739 Peripheral vascular disease, unspecified: Secondary | ICD-10-CM

## 2012-07-03 MED ORDER — RIVAROXABAN 20 MG PO TABS: 20.0000 mg | ORAL_TABLET | Freq: Every day | ORAL | Status: DC

## 2012-07-03 NOTE — Progress Notes (Signed)
History of Present Illness: 56 yo WM with h/o PAD s/p left popliteal artery intervention, carotid artery disease s/p Left CEA per Dr. Kellie Simmering, DM, HTN, HL, COPD, DJD, GERD here for PV follow up. His cardiac issues are followed by Dr. Rollene Fare in [facility] and Vascular. I saw him May 24th 2012 for new PV evaluation. He described pain in both legs with walking. ABI in primary care with ABI of 0.75 on the left and 1.18 on the right. He also described weakness in both legs with walking. I arranged a distal aortogram at [facility] on February 21, 2011. He was found to have a severe 90% left popliteal artery stenosis. He was brought back on July 11th 2012 for an atherectomy of the left popliteal artery. He had some symptomatic improvement after the atherectomy but had continued pain in both legs and sought a second opinion with Dr. Victorino Dike [facility]. He had a good pulse in his left foot and it was felt that his PAD was stable. He has had amputations of his 4th-5th toes in February 2013 for osteomyelitis. I saw him for PV follow up on February 26, 2012. He described weakness and heaviness in both legs with walking 50 feet. No rest pain or ulcerations. He has stopped smoking but has been using an e-cigarette. He also had c/o dizziness. He had carotid artery dopplers 12/12/11 in the [facility] office per Dr. Kellie Simmering which showed stable left CEA site with no restenosis and mild Right ICA stenosis, less than 39%. Distal aortogram on 03/19/12 showed stable atherectomy site of left popliteal artery but severe below the knee disease. The right common femoral artery is patent. The right SFA has mild plaque throughout. None of these stenoses are flow limiting. The right popliteal artery is patent with mild plaque. The right ATA has 99% proximal diffuse stenosis. The right peroneal is patent with mild plaque. The right PTA is occluded proximally. The left common femoral artery is patent. The left  SFA is patent with mild diffuse plaque. The left popliteal atherectomy site is patent with mild stenosis. The left ATA is occluded proximally. The left PTA and peroneal are patent with mild plaque disease. Lower extremity venous dopplers negative for DVT on 03/21/12. He was seen by Dr. Kellie Simmering but there were no good surgical options for his LE disease. He was referred to Dr. Brunetta Jeans in [place], [place] and had percutaneous intervention below the right knee with angioplasty of the right ATA and was brought back for PTA of the left below the knee vessels.  He called our office 06/27/12 with c/o a knot in his left leg. Seen in ED and venous dopplers with evidence of left lower extremity DVT. He was started on Xarelto 15 mg po BID for 21 days with plans for 6 months of therapy.   He is here today for PV follow up. His right leg feels better. His left leg aches from the DVT. He has been taking Xarelto with no bleeding issues. He tells me that he has plans to go to [facility] for evaluation of carotid artery stenosis. He is very happy with Dr. Andree Elk in regards to his PAD  Primary Care Physician: Wilson Singer    Past Medical History  Diagnosis Date  . Carotid artery occlusion 11/10/10    LEFT CAROTID ENDARTERECTOMY  . Diabetes mellitus   . Hypertension   . Hyperlipidemia   . COPD (chronic obstructive pulmonary disease)   . DJD (  degenerative joint disease)   . GERD (gastroesophageal reflux disease)   . Substernal chest pain   . 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 Slurred speech     AS PER WIFE IN D/C NOTE 11/10/10  . PAD (peripheral artery disease)     Distal aortogram June 2012. Atherectomy left popliteal artery July 2012.   . Barrett's esophagus   . Fatty liver   . Diverticulosis of colon (without mention of hemorrhage)   . Sleep apnea   . Emphysema of lung     Past Surgical History  Procedure Date  . Laminectomy     X 3 LUMBAR AND X 2 CERVICAL SPINE OPERATIONS  . Carotid  endarterectomy 11/10/10  . Hernia repair     LEFT INGUINAL AND UMBILICAL REPAIRS  . Anterior fusion cervical spine 02/06/06    C4-5, C5-6, C6-7; SURGEON DR. MAX COHEN  . Penile prosthesis implant 08/14/05    INFRAPUBIC INSERTION OF INFLATABLE PENILE PROSTHESIS; SURGEON DR. Amalia Hailey  . Cardiac catheterization 10/31/04  . Laparoscopic cholecystectomy w/ cholangiography 11/09/04    SURGEON DR. Luella Cook  . Total knee arthroplasty 07/2002    RIGHT KNEE ; SURGEON  DR. [name] ALSO HAD ARTHROSCOPIC RIGHT KNEE IN  10/2001  . Shoulder arthroscopy   . Cholecystectomy   . Amputation 11/05/2011    Procedure: AMPUTATION RAY;  Surgeon: Wylene Simmer, MD;  Location: [place];  Service: Orthopedics;  Laterality: Right;  Amputation of Right 4&5th Toes    Current Outpatient Prescriptions  Medication Sig Dispense Refill  . ALPRAZolam (XANAX) 0.5 MG tablet Take 0.5 mg by mouth daily.       Marland Kitchen amLODipine-valsartan (EXFORGE) 10-320 MG per tablet Take 1 tablet by mouth daily.       Marland Kitchen aspirin 81 MG EC tablet Take 81 mg by mouth 2 (two) times daily.       Marland Kitchen atorvastatin (LIPITOR) 40 MG tablet Take 40 mg by mouth daily.       . B Complex-C (B-COMPLEX WITH VITAMIN C) tablet Take 1 tablet by mouth daily.      . carvedilol (COREG) 12.5 MG tablet Take 12.5 mg by mouth 2 (two) times daily with a meal.       . Choline Fenofibrate (TRILIPIX) 135 MG capsule Take 135 mg by mouth daily.       . clopidogrel (PLAVIX) 75 MG tablet 1 tab daily      . fentaNYL (DURAGESIC - DOSED MCG/HR) 25 MCG/HR Place 1 patch onto the skin every 3 (three) days.      Marland Kitchen FLUoxetine (PROZAC) 20 MG capsule Take 20 mg by mouth daily.       Marland Kitchen glyBURIDE-metformin (GLUCOVANCE) 2.5-500 MG per tablet Take 2 tablets by mouth 2 (two) times daily.       . insulin glargine (LANTUS) 100 UNIT/ML injection Inject 50 Units into the skin 2 (two) times daily.       . lansoprazole (PREVACID) 30 MG capsule Take 30 mg by mouth 2 (two) times daily.      Marland Kitchen omega-3 acid ethyl  esters (LOVAZA) 1 G capsule Take 2 g by mouth 2 (two) times daily.       . ondansetron (ZOFRAN-ODT) 4 MG disintegrating tablet Take 4 mg by mouth Every 6 hours as needed. For nausea      . oxyCODONE-acetaminophen (TYLOX) 5-500 MG per capsule Take 2 capsules by mouth every 8 (eight) hours as needed. For  pain      . PERCOCET 5-325 MG per tablet Take 1 tablet by mouth every 6 (six) hours as needed for pain.  15 tablet  0  . POTASSIUM PO Take 1 tablet by mouth daily.      . pregabalin (LYRICA) 75 MG capsule Take 75 mg by mouth 3 (three) times daily.       . Rivaroxaban (XARELTO) 15 MG TABS tablet Take 1 tablet (15 mg total) by mouth 2 (two) times daily.  42 tablet  0  . Rivaroxaban (XARELTO) 15 MG TABS tablet Take 1 tablet (15 mg total) by mouth 2 (two) times daily.  20 tablet  0  . rOPINIRole (REQUIP) 2 MG tablet Take 2 mg by mouth at bedtime.       . Tamsulosin HCl (FLOMAX) 0.4 MG CAPS Take 0.4 mg by mouth daily.      . traMADol (ULTRAM) 50 MG tablet Take 50 mg by mouth every 6 (six) hours as needed. For pain      . triamterene-hydrochlorothiazide (DYAZIDE) 37.5-25 MG per capsule Take 1 capsule by mouth daily.         Allergies  Allergen Reactions  . Adhesive (Tape) Rash    History   Social History  . Marital Status: Married    Spouse Name: N/A    Number of Children: N/A  . Years of Education: N/A   Occupational History  . TRUCK DRIVER    Social History Main Topics  . Smoking status: Former Smoker -- 0.5 packs/day for 35 years    Types: Cigarettes    Quit date: 10/31/2011  . Smokeless tobacco: Never Used  . Alcohol Use: Yes     occasionally  . Drug Use: No  . Sexually Active: Not on file   Other Topics Concern  . Not on file   Social History Narrative   HEAVY SMOKER AND CONTINUES TO SMOKE 1 PPD. DOES NOT EXERCISE REGULARLY. Wife [phone] Lebron Quam). Has 2 sons and daughter. Still active    Family History  Problem Relation Age of Onset  . Heart disease Father   .  Diabetes Father   . Cancer Father     PROSTATE  . Arthritis      GRANDMOTHER  . Hypertension      OTHER FAMILY MEMBERS  . Colon cancer Brother     Review of Systems:  As stated in the HPI and otherwise negative.   BP 150/90  Pulse 84  Ht 6\' 2"  (1.88 m)  Wt 241 lb (109.317 kg)  BMI 30.94 kg/m2  Physical Examination: General: Well developed, well nourished, NAD HEENT: OP clear, mucus membranes moist SKIN: warm, dry. No rashes. Neuro: No focal deficits Musculoskeletal: Muscle strength 5/5 all ext Psychiatric: Mood and affect normal Neck: No JVD, no carotid bruits, no thyromegaly, no lymphadenopathy. Lungs:Clear bilaterally, no wheezes, rhonci, crackles Cardiovascular: Regular rate and rhythm. No murmurs, gallops or rubs. Abdomen:Soft. Bowel sounds present. Non-tender.  Extremities: 1+ left leg edema. Pulses are trace bilateral DP/PT.  Venous doppler 06/27/12: Left lower ext DVT  Assessment and Plan:   1. PAD: He has severe lower extremity disease. This is being managed at [facility] in [place], Dr. Brunetta Jeans. Continue daily walking. Continue ASA and Plavix.   2. Left lower extremity DVT: Likely due to immobility after recent procedure. He is on Xarelto 15 mg po BID for 3 weeks. Will give him prescription for Xarelto 20 mg po once daily to start in 2 weeks.  Repeat venous doppler left leg in 2 weeks and if no evidence of residual DVT, can stop Xarelto then.

## 2012-07-03 NOTE — Patient Instructions (Addendum)
Your physician recommends that you schedule a follow-up appointment in:  3 months.  (after doppler studies)   Your physician has requested that you have a lower or upper extremity venous duplex. This test is an ultrasound of the veins in the legs or arms. It looks at venous blood flow that carries blood from the heart to the legs or arms. Allow one hour for a Lower Venous exam. Allow thirty minutes for an Upper Venous exam. There are no restrictions or special instructions. To be done in 3 months.     Your physician has recommended you make the following change in your medication:  Change Xarelto to 20 mg once daily in 2 weeks (after you complete the 3 weeks of twice daily Xarelto)

## 2012-07-08 ENCOUNTER — Emergency Department (HOSPITAL_COMMUNITY)
Admission: EM | Admit: 2012-07-08 | Discharge: 2012-07-09 | Payer: BC Managed Care – PPO | Attending: Emergency Medicine | Admitting: Emergency Medicine

## 2012-07-08 ENCOUNTER — Encounter (HOSPITAL_COMMUNITY): Payer: Self-pay | Admitting: Emergency Medicine

## 2012-07-08 DIAGNOSIS — I82402 Acute embolism and thrombosis of unspecified deep veins of left lower extremity: Secondary | ICD-10-CM

## 2012-07-08 DIAGNOSIS — M1990 Unspecified osteoarthritis, unspecified site: Secondary | ICD-10-CM | POA: Insufficient documentation

## 2012-07-08 DIAGNOSIS — I7389 Other specified peripheral vascular diseases: Secondary | ICD-10-CM | POA: Insufficient documentation

## 2012-07-08 DIAGNOSIS — K2270 Barrett's esophagus without dysplasia: Secondary | ICD-10-CM | POA: Insufficient documentation

## 2012-07-08 DIAGNOSIS — E119 Type 2 diabetes mellitus without complications: Secondary | ICD-10-CM | POA: Insufficient documentation

## 2012-07-08 DIAGNOSIS — K219 Gastro-esophageal reflux disease without esophagitis: Secondary | ICD-10-CM | POA: Insufficient documentation

## 2012-07-08 DIAGNOSIS — I10 Essential (primary) hypertension: Secondary | ICD-10-CM | POA: Insufficient documentation

## 2012-07-08 DIAGNOSIS — I6529 Occlusion and stenosis of unspecified carotid artery: Secondary | ICD-10-CM | POA: Insufficient documentation

## 2012-07-08 DIAGNOSIS — I82409 Acute embolism and thrombosis of unspecified deep veins of unspecified lower extremity: Secondary | ICD-10-CM | POA: Insufficient documentation

## 2012-07-08 DIAGNOSIS — G4730 Sleep apnea, unspecified: Secondary | ICD-10-CM | POA: Insufficient documentation

## 2012-07-08 DIAGNOSIS — K7689 Other specified diseases of liver: Secondary | ICD-10-CM | POA: Insufficient documentation

## 2012-07-08 DIAGNOSIS — Z79899 Other long term (current) drug therapy: Secondary | ICD-10-CM | POA: Insufficient documentation

## 2012-07-08 DIAGNOSIS — K5730 Diverticulosis of large intestine without perforation or abscess without bleeding: Secondary | ICD-10-CM | POA: Insufficient documentation

## 2012-07-08 DIAGNOSIS — J4489 Other specified chronic obstructive pulmonary disease: Secondary | ICD-10-CM | POA: Insufficient documentation

## 2012-07-08 DIAGNOSIS — E782 Mixed hyperlipidemia: Secondary | ICD-10-CM | POA: Insufficient documentation

## 2012-07-08 DIAGNOSIS — J449 Chronic obstructive pulmonary disease, unspecified: Secondary | ICD-10-CM | POA: Insufficient documentation

## 2012-07-08 DIAGNOSIS — J438 Other emphysema: Secondary | ICD-10-CM | POA: Insufficient documentation

## 2012-07-08 DIAGNOSIS — Z87891 Personal history of nicotine dependence: Secondary | ICD-10-CM | POA: Insufficient documentation

## 2012-07-08 LAB — CBC WITH DIFFERENTIAL/PLATELET
Basophils Absolute: 0 10*3/uL (ref 0.0–0.1)
Basophils Relative: 0 % (ref 0–1)
Eosinophils Absolute: 0.1 10*3/uL (ref 0.0–0.7)
Eosinophils Relative: 1 % (ref 0–5)
HCT: 34.4 % — ABNORMAL LOW (ref 39.0–52.0)
Hemoglobin: 12.5 g/dL — ABNORMAL LOW (ref 13.0–17.0)
Lymphocytes Relative: 13 % (ref 12–46)
Lymphs Abs: 1.2 10*3/uL (ref 0.7–4.0)
MCH: 29.6 pg (ref 26.0–34.0)
MCHC: 36.3 g/dL — ABNORMAL HIGH (ref 30.0–36.0)
MCV: 81.3 fL (ref 78.0–100.0)
Monocytes Absolute: 0.6 10*3/uL (ref 0.1–1.0)
Monocytes Relative: 7 % (ref 3–12)
Neutro Abs: 7.2 10*3/uL (ref 1.7–7.7)
Neutrophils Relative %: 79 % — ABNORMAL HIGH (ref 43–77)
Platelets: 228 10*3/uL (ref 150–400)
RBC: 4.23 MIL/uL (ref 4.22–5.81)
RDW: 12.3 % (ref 11.5–15.5)
WBC: 9.1 10*3/uL (ref 4.0–10.5)

## 2012-07-08 LAB — BASIC METABOLIC PANEL
BUN: 14 mg/dL (ref 6–23)
CO2: 26 mEq/L (ref 19–32)
Calcium: 9.3 mg/dL (ref 8.4–10.5)
Chloride: 96 mEq/L (ref 96–112)
Creatinine, Ser: 0.73 mg/dL (ref 0.50–1.35)
GFR calc Af Amer: >90 mL/min (ref >90)
GFR calc non Af Amer: >90 mL/min (ref >90)
Glucose, Bld: 317 mg/dL — ABNORMAL HIGH (ref 70–99)
Potassium: 4.2 mEq/L (ref 3.5–5.1)
Sodium: 133 mEq/L — ABNORMAL LOW (ref 135–145)

## 2012-07-08 LAB — LACTIC ACID, PLASMA: Lactic Acid, Venous: 1.9 mmol/L (ref 0.5–2.2)

## 2012-07-08 LAB — PROTIME-INR
INR: 1.79 — ABNORMAL HIGH (ref 0.00–1.49)
Prothrombin Time: 20.2 seconds — ABNORMAL HIGH (ref 11.6–15.2)

## 2012-07-08 MED ORDER — HYDROMORPHONE HCL PF 1 MG/ML IJ SOLN
1.0000 mg | Freq: Once | INTRAMUSCULAR | Status: AC
  Administered 2012-07-08: 1 mg via INTRAVENOUS
  Filled 2012-07-08: qty 1

## 2012-07-08 NOTE — ED Notes (Signed)
Pt. Brought via EMS, w/ complaints of pain in left lower leg. Ptr. Has hx of DVTs and vascular stints.

## 2012-07-08 NOTE — ED Notes (Signed)
Spoke with nurse at [place].  Recd room number [place] [phone]

## 2012-07-08 NOTE — ED Notes (Signed)
Report called to Linneus at [place].

## 2012-07-08 NOTE — ED Notes (Signed)
[facility] called and stated it would be greater than 2 hours.

## 2012-07-08 NOTE — ED Provider Notes (Signed)
History     CSN: [id number]  Arrival date & time 07/08/12  1811   First MD Initiated Contact with Patient 07/08/12 1816      Chief Complaint  Patient presents with  . DVT  . Claudication    (Consider location/radiation/quality/duration/timing/severity/associated sxs/prior treatment) HPI Comments: History of peripheral arterial disease and DVT of left lower extremity.  Patient is a 56 y.o. male presenting with leg pain. The history is provided by the patient. No language interpreter was used.  Leg Pain  The incident occurred more than 1 week ago. The incident occurred at home. There was no injury mechanism. The pain is present in the left leg. The quality of the pain is described as aching. The pain is at a severity of 8/10. The pain is severe. The pain has been constant since onset. Associated symptoms include muscle weakness. He reports no foreign bodies present. The symptoms are aggravated by bearing weight. Treatments tried: fent patch. The treatment provided no relief.    Past Medical History  Diagnosis Date  . Carotid artery occlusion 11/10/10    LEFT CAROTID ENDARTERECTOMY  . Diabetes mellitus   . Hypertension   . Hyperlipidemia   . COPD (chronic obstructive pulmonary disease)   . DJD (degenerative joint disease)   . GERD (gastroesophageal reflux disease)   . Substernal chest pain   . 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 Slurred speech     AS PER WIFE IN D/C NOTE 11/10/10  . PAD (peripheral artery disease)     Distal aortogram June 2012. Atherectomy left popliteal artery July 2012.   . Barrett's esophagus   . Fatty liver   . Diverticulosis of colon (without mention of hemorrhage)   . Sleep apnea   . Emphysema of lung     Past Surgical History  Procedure Date  . Laminectomy     X 3 LUMBAR AND X 2 CERVICAL SPINE OPERATIONS  . Carotid endarterectomy 11/10/10  . Hernia repair     LEFT INGUINAL AND UMBILICAL REPAIRS  . Anterior fusion  cervical spine 02/06/06    C4-5, C5-6, C6-7; SURGEON DR. MAX COHEN  . Penile prosthesis implant 08/14/05    INFRAPUBIC INSERTION OF INFLATABLE PENILE PROSTHESIS; SURGEON DR. Amalia Hailey  . Cardiac catheterization 10/31/04  . Laparoscopic cholecystectomy w/ cholangiography 11/09/04    SURGEON DR. Luella Cook  . Total knee arthroplasty 07/2002    RIGHT KNEE ; SURGEON  DR. [name] ALSO HAD ARTHROSCOPIC RIGHT KNEE IN  10/2001  . Shoulder arthroscopy   . Cholecystectomy   . Amputation 11/05/2011    Procedure: AMPUTATION RAY;  Surgeon: Wylene Simmer, MD;  Location: [place];  Service: Orthopedics;  Laterality: Right;  Amputation of Right 4&5th Toes    Family History  Problem Relation Age of Onset  . Heart disease Father   . Diabetes Father   . Cancer Father     PROSTATE  . Arthritis      GRANDMOTHER  . Hypertension      OTHER FAMILY MEMBERS  . Colon cancer Brother     History  Substance Use Topics  . Smoking status: Former Smoker -- 0.5 packs/day for 35 years    Types: Cigarettes    Quit date: 10/31/2011  . Smokeless tobacco: Never Used  . Alcohol Use: Yes     occasionally      Review of Systems  Constitutional: Negative for diaphoresis, activity change and appetite change.  HENT: Negative for sore throat and neck pain.   Eyes: Negative for discharge and visual disturbance.  Respiratory: Negative for cough, choking and shortness of breath.   Cardiovascular: Negative for chest pain and leg swelling.  Gastrointestinal: Negative for nausea, vomiting, abdominal pain, diarrhea and constipation.  Genitourinary: Negative for dysuria and difficulty urinating.  Musculoskeletal: Positive for myalgias (L calf) and gait problem. Negative for back pain and arthralgias.  Skin: Negative for color change and pallor.  Neurological: Negative for dizziness, speech difficulty and light-headedness.  Psychiatric/Behavioral: Negative for behavioral problems and agitation.  All other systems reviewed and are  negative.    Allergies  Adhesive  Home Medications   Current Outpatient Rx  Name Route Sig Dispense Refill  . ALPRAZOLAM 0.5 MG PO TABS Oral Take 0.5 mg by mouth daily.     Marland Kitchen AMLODIPINE BESYLATE-VALSARTAN 10-320 MG PO TABS Oral Take 1 tablet by mouth daily.     . ASPIRIN 81 MG PO TBEC Oral Take 81 mg by mouth 2 (two) times daily.     . ATORVASTATIN CALCIUM 40 MG PO TABS Oral Take 40 mg by mouth daily.     . B COMPLEX-C PO TABS Oral Take 1 tablet by mouth daily.    Marland Kitchen CARVEDILOL 12.5 MG PO TABS Oral Take 12.5 mg by mouth 2 (two) times daily with a meal.     . CHOLINE FENOFIBRATE 135 MG PO CPDR Oral Take 135 mg by mouth daily.     Marland Kitchen CLOPIDOGREL BISULFATE 75 MG PO TABS  1 tab daily    . FENTANYL 25 MCG/HR TD PT72 Transdermal Place 1 patch onto the skin every 3 (three) days.    Marland Kitchen FLUOXETINE HCL 20 MG PO CAPS Oral Take 20 mg by mouth daily.     . GLYBURIDE-METFORMIN 2.5-500 MG PO TABS Oral Take 2 tablets by mouth 2 (two) times daily.     . INSULIN GLARGINE 100 UNIT/ML [place] SOLN Subcutaneous Inject 50 Units into the skin 2 (two) times daily.     Marland Kitchen LANSOPRAZOLE 30 MG PO CPDR Oral Take 30 mg by mouth 2 (two) times daily.    . OMEGA-3-ACID ETHYL ESTERS 1 G PO CAPS Oral Take 2 g by mouth 2 (two) times daily.     Marland Kitchen ONDANSETRON 4 MG PO TBDP Oral Take 4 mg by mouth Every 6 hours as needed. For nausea    . OXYCODONE-ACETAMINOPHEN 5-500 MG PO CAPS Oral Take 2 capsules by mouth every 8 (eight) hours as needed. For pain    . PERCOCET 5-325 MG PO TABS Oral Take 1 tablet by mouth every 6 (six) hours as needed for pain. 15 tablet 0    Dispense as written.  Marland Kitchen POTASSIUM PO Oral Take 1 tablet by mouth daily.    Marland Kitchen PREGABALIN 75 MG PO CAPS Oral Take 75 mg by mouth 3 (three) times daily.     Marland Kitchen RIVAROXABAN 15 MG PO TABS Oral Take 1 tablet (15 mg total) by mouth 2 (two) times daily. 42 tablet 0  . RIVAROXABAN 15 MG PO TABS Oral Take 1 tablet (15 mg total) by mouth 2 (two) times daily. 20 tablet 0  . RIVAROXABAN 20  MG PO TABS Oral Take 1 tablet (20 mg total) by mouth daily. 30 tablet 3  . ROPINIROLE HCL 2 MG PO TABS Oral Take 2 mg by mouth at bedtime.     . TAMSULOSIN HCL 0.4 MG PO CAPS Oral Take 0.4 mg by mouth daily.    Marland Kitchen  TRAMADOL HCL 50 MG PO TABS Oral Take 50 mg by mouth every 6 (six) hours as needed. For pain    . TRIAMTERENE-HCTZ 37.5-25 MG PO CAPS Oral Take 1 capsule by mouth daily.       Pulse 75  Temp 98.3 F (36.8 C) (Oral)  Ht 6\' 2"  ([id number] m)  Wt 241 lb (109.317 kg)  BMI 30.94 kg/m2  SpO2 98%  Physical Exam  Constitutional: He appears well-developed. No distress.  HENT:  Head: Normocephalic and atraumatic.  Mouth/Throat: No oropharyngeal exudate.  Eyes: EOM are normal. Pupils are equal, round, and reactive to light. Right eye exhibits no discharge. Left eye exhibits no discharge.  Neck: Normal range of motion. Neck supple. No JVD present.  Cardiovascular: Normal rate, regular rhythm and normal heart sounds.   Pulmonary/Chest: Effort normal and breath sounds normal. No stridor. No respiratory distress. He has no wheezes. He has no rales. He exhibits no tenderness.  Abdominal: Soft. Bowel sounds are normal. There is no tenderness. There is no guarding.  Genitourinary: Penis normal.  Musculoskeletal: Normal range of motion. He exhibits edema and tenderness.       Mild swelling of left calf, 2+ pitting edema of left ankle, exquisitely tender to palpation of posterior mid calf, no palpable tender cord.  No focus of  erythema, induration, fluctuance.  Full range of motion of left knee without pain.  Neurological: He is alert. No cranial nerve deficit. He exhibits normal muscle tone.  Skin: Skin is warm and dry. He is not diaphoretic. No erythema. No pallor.  Psychiatric: He has a normal mood and affect. His behavior is normal. Judgment and thought content normal.    ED Course  Procedures (including critical care time)  Labs Reviewed  CBC WITH DIFFERENTIAL - Abnormal; Notable for the  following:    Hemoglobin 12.5 (*)     HCT 34.4 (*)     MCHC 36.3 (*)     Neutrophils Relative 79 (*)     All other components within normal limits  BASIC METABOLIC PANEL - Abnormal; Notable for the following:    Sodium 133 (*)     Glucose, Bld 317 (*)     All other components within normal limits  PROTIME-INR - Abnormal; Notable for the following:    Prothrombin Time 20.2 (*)     INR 1.79 (*)     All other components within normal limits  LACTIC ACID, PLASMA   No results found.   1. Left leg DVT       MDM  Instructed by his doctor, Dr. Andree Elk, to come to the ER for evaluation of left calf pain.  He recently had a stent placed in his left lower extremity for her severe peripheral arterial disease.  He was also recently diagnosed with a DVT of his left lower extremity and placed on xarelto.  He has gradually had worsening left calf pain over the past 3 weeks, today he was unable to tolerate the pain which prompted him to come to the ER.  On exam he had good peripheral pulses including popliteal, dorsalis pedis, posterior tibial and good cap refill.  His ABIs are listed below. ABIs: RUE 158, RLE 185 = 1.2  LUE 198, LLE 144 = 0.73 Analgesia was provided with Dilaudid which patient responded to well.  I spoke with the physician assistant with Dr. Andree Elk group and they recommended transfer to their facility in [place], [place].  This was arranged, the patient was given the  risks and benefits of the transfer, the patient was transferred to the other facility in stable condition, accepted by Dr. Arta Silence.  Doubt acute vascular occlusion, critical limb ischemia.  The pain may be related to worsening DVT versus worsening peripheral arterial disease.  Doubt infectious process.       Verdie Shire, MD 07/08/12 2350

## 2012-07-09 MED ORDER — HYDROMORPHONE HCL PF 1 MG/ML IJ SOLN
1.0000 mg | Freq: Once | INTRAMUSCULAR | Status: AC
  Administered 2012-07-09: 1 mg via INTRAVENOUS
  Filled 2012-07-09: qty 1

## 2012-07-09 NOTE — ED Provider Notes (Signed)
I saw and evaluated the patient, reviewed the resident's note and I agree with the findings and plan.   Harold Rice, MD 07/09/12 [phone]

## 2012-07-09 NOTE — ED Notes (Signed)
Pt transferred to [facility] for scheduled surgical intervention

## 2012-07-22 ENCOUNTER — Encounter: Payer: Self-pay | Admitting: Internal Medicine

## 2012-07-23 ENCOUNTER — Encounter: Payer: Self-pay | Admitting: Internal Medicine

## 2012-07-23 ENCOUNTER — Ambulatory Visit (INDEPENDENT_AMBULATORY_CARE_PROVIDER_SITE_OTHER): Payer: BC Managed Care – PPO | Admitting: Internal Medicine

## 2012-07-23 VITALS — BP 150/80 | HR 60 | Ht 74.0 in | Wt 248.8 lb

## 2012-07-23 DIAGNOSIS — R110 Nausea: Secondary | ICD-10-CM

## 2012-07-23 DIAGNOSIS — K2270 Barrett's esophagus without dysplasia: Secondary | ICD-10-CM

## 2012-07-23 DIAGNOSIS — Z1211 Encounter for screening for malignant neoplasm of colon: Secondary | ICD-10-CM

## 2012-07-23 DIAGNOSIS — K219 Gastro-esophageal reflux disease without esophagitis: Secondary | ICD-10-CM

## 2012-07-23 MED ORDER — PANTOPRAZOLE SODIUM 20 MG PO TBEC: 20.0000 mg | DELAYED_RELEASE_TABLET | Freq: Every day | ORAL | Status: DC

## 2012-07-23 MED ORDER — PROMETHAZINE HCL 12.5 MG PO TABS: 12.5000 mg | ORAL_TABLET | Freq: Four times a day (QID) | ORAL | Status: DC | PRN

## 2012-07-23 NOTE — Patient Instructions (Addendum)
We have sent the following medications to your pharmacy for you to pick up at your convenience: Prevacid and Phenergan; please take as directed   Follow up with Dr. Hilarie Fredrickson in 1 year

## 2012-07-23 NOTE — Progress Notes (Signed)
Subjective:    Patient ID: [name], male    DOB: 09-19-1955, 56 y.o.   MRN: [id number]  HPI [name] is a 56 year old male with a past medical history of hypertension, tobacco abuse, peripheral vascular disease, GERD with history of Barrett's esophagus, DVT who seen in followup. The patient underwent upper endoscopy on 05/31/2011 which revealed an irregular Z line which was biopsied, otherwise normal esophagus, hiatus hernia, and otherwise normal stomach and duodenum. Pathology results revealed mild inflammation consistent with reflux the GE junction but no evidence of Barrett's esophagus. He has been maintained on lansoprazole 30 mg twice a day since this time, which continues to control his heartburn and reflux symptoms well. He does intermittently have issues with nausea but no vomiting. His appetite has been good, and he denies weight loss. He did try Zofran for nausea without much benefit. He denies dysphagia and odynophagia. Bowel movements have been regular for him without blood or melena. He does occasionally have loose stools but this is long-standing for him and chronic. No fevers or chills.  No abdominal pain.  Review of Systems As per history of present illness, otherwise negative except for left lower extremity pain chronic in nature  Current Medications, Allergies, Past Medical History, Past Surgical History, Family History and Social History were reviewed in [employer] record.     Objective:   Physical Exam BP 150/80  Pulse 60  Ht 6\' 2"  (1.88 m)  Wt 248 lb 12.8 oz (112.855 kg)  BMI 31.94 kg/m2 Constitutional: Well-developed and well-nourished. No distress. HEENT: Normocephalic and atraumatic. Oropharynx is clear and moist. No oropharyngeal exudate. Conjunctivae are normal.  No scleral icterus. Neck: Neck supple. Trachea midline. Cardiovascular: Normal rate, regular rhythm and intact distal pulses. No M/R/G Pulmonary/chest: Effort normal and  breath sounds normal. No wheezing, rales or rhonchi. Abdominal: Soft, nontender, nondistended. Bowel sounds active throughout. There are no masses palpable. No hepatosplenomegaly. Extremities: no clubbing, cyanosis Neurological: Alert and oriented to person place and time. Skin: Skin is warm and dry. No rashes noted. Psychiatric: Normal mood and affect. Behavior is normal.  Prior endoscopy/colonoscopy  01/25/2005: EGD revealed Barrett's esophagus with no dysplasia  11/06/2006: EGD with possible Barrett's islands, pathology no metaplasia or dysplasia  08/10/2008: EGD with possible Barrett's islands, pathology no metaplasia or dysplasia  05/31/2011: EGD with irregular Z line, pathology no metaplasia or dysplasia  01/25/2005: Colonoscopy hyperplastic rectal polyp and fragments of mucosal prolapse     Assessment & Plan:   56 year old male with a past medical history of hypertension, tobacco abuse, peripheral vascular disease, GERD with history of Barrett's esophagus, DVT who seen in followup  1.  GERD/history of Barrett's -- the patient has reflux disease which is well-controlled on PPI therapy currently. We'll continue him on lansoprazole 30 mg twice a day. We discussed dropping to once a day, but he has breakthrough heartburn symptoms when doing so, and therefore we'll continue on his current dose and regimen. Regarding his Barrett's, he did have biopsy proven Barrett's esophagus in 2006, but on endoscopy with biopsy in 2008, 2009, and 2012 there was no evidence of Barrett's. We discussed how this may have regressed, or been biopsied away. Either way this remains very low risk, and I do not think he needs every 2-3 year surveillance.  At most, we may consider repeat endoscopy 5 years from his last endoscopy which would be around September 2017. We can discuss this more in the future  2.  Nausea --  in the past there had been a question of gastroparesis given the presence of food in the stomach at  endoscopy. He has diabetes which makes gastroparesis possible. At present he is having some mild nausea, but no vomiting. This is been a somewhat persistent symptoms for him. Zofran will be discontinued as it is not helping him, and I will give him a trial of Phenergan 12.5 mg every 6 hours when necessary nausea. He is made aware that this medication can be sedating and he should not drive or operate heavy machinery while using this medication.  He is asked to let us know if his nausea does not improve. Otherwise he'll follow up in one year.  3. CRC screening -- the patient had colonoscopy with hyperplastic rectal polyp in May 2006, he is due for repeat in May 2016

## 2012-09-25 DIAGNOSIS — G43909 Migraine, unspecified, not intractable, without status migrainosus: Secondary | ICD-10-CM | POA: Insufficient documentation

## 2012-09-25 DIAGNOSIS — G629 Polyneuropathy, unspecified: Secondary | ICD-10-CM | POA: Insufficient documentation

## 2012-09-25 DIAGNOSIS — M1990 Unspecified osteoarthritis, unspecified site: Secondary | ICD-10-CM | POA: Insufficient documentation

## 2012-09-25 DIAGNOSIS — G2581 Restless legs syndrome: Secondary | ICD-10-CM | POA: Insufficient documentation

## 2012-09-29 DIAGNOSIS — R0989 Other specified symptoms and signs involving the circulatory and respiratory systems: Secondary | ICD-10-CM

## 2012-10-02 ENCOUNTER — Ambulatory Visit: Payer: BC Managed Care – PPO | Admitting: Cardiovascular Disease

## 2012-11-18 ENCOUNTER — Encounter: Payer: Self-pay | Admitting: Cardiovascular Disease

## 2012-11-23 ENCOUNTER — Emergency Department (HOSPITAL_COMMUNITY): Payer: BC Managed Care – PPO

## 2012-11-23 ENCOUNTER — Encounter (HOSPITAL_COMMUNITY): Payer: Self-pay

## 2012-11-23 DIAGNOSIS — L0390 Cellulitis, unspecified: Secondary | ICD-10-CM | POA: Diagnosis present

## 2012-11-23 DIAGNOSIS — I739 Peripheral vascular disease, unspecified: Secondary | ICD-10-CM | POA: Diagnosis present

## 2012-11-23 DIAGNOSIS — E119 Type 2 diabetes mellitus without complications: Secondary | ICD-10-CM

## 2012-11-23 DIAGNOSIS — L02619 Cutaneous abscess of unspecified foot: Secondary | ICD-10-CM | POA: Diagnosis present

## 2012-11-23 DIAGNOSIS — J438 Other emphysema: Secondary | ICD-10-CM | POA: Diagnosis present

## 2012-11-23 DIAGNOSIS — K2270 Barrett's esophagus without dysplasia: Secondary | ICD-10-CM | POA: Diagnosis present

## 2012-11-23 DIAGNOSIS — X58XXXA Exposure to other specified factors, initial encounter: Secondary | ICD-10-CM | POA: Diagnosis present

## 2012-11-23 DIAGNOSIS — F17200 Nicotine dependence, unspecified, uncomplicated: Secondary | ICD-10-CM | POA: Diagnosis present

## 2012-11-23 DIAGNOSIS — L0291 Cutaneous abscess, unspecified: Secondary | ICD-10-CM

## 2012-11-23 DIAGNOSIS — Z79899 Other long term (current) drug therapy: Secondary | ICD-10-CM

## 2012-11-23 DIAGNOSIS — Z7902 Long term (current) use of antithrombotics/antiplatelets: Secondary | ICD-10-CM

## 2012-11-23 DIAGNOSIS — I10 Essential (primary) hypertension: Secondary | ICD-10-CM | POA: Diagnosis present

## 2012-11-23 DIAGNOSIS — S92919A Unspecified fracture of unspecified toe(s), initial encounter for closed fracture: Secondary | ICD-10-CM | POA: Diagnosis present

## 2012-11-23 DIAGNOSIS — Z48812 Encounter for surgical aftercare following surgery on the circulatory system: Secondary | ICD-10-CM

## 2012-11-23 DIAGNOSIS — Z87891 Personal history of nicotine dependence: Secondary | ICD-10-CM | POA: Diagnosis present

## 2012-11-23 DIAGNOSIS — I7092 Chronic total occlusion of artery of the extremities: Secondary | ICD-10-CM

## 2012-11-23 DIAGNOSIS — M9080 Osteopathy in diseases classified elsewhere, unspecified site: Secondary | ICD-10-CM | POA: Diagnosis present

## 2012-11-23 DIAGNOSIS — Z720 Tobacco use: Secondary | ICD-10-CM

## 2012-11-23 DIAGNOSIS — M869 Osteomyelitis, unspecified: Secondary | ICD-10-CM

## 2012-11-23 DIAGNOSIS — L03039 Cellulitis of unspecified toe: Secondary | ICD-10-CM | POA: Diagnosis present

## 2012-11-23 DIAGNOSIS — L97529 Non-pressure chronic ulcer of other part of left foot with unspecified severity: Secondary | ICD-10-CM | POA: Diagnosis present

## 2012-11-23 DIAGNOSIS — L03116 Cellulitis of left lower limb: Secondary | ICD-10-CM

## 2012-11-23 DIAGNOSIS — B351 Tinea unguium: Secondary | ICD-10-CM

## 2012-11-23 DIAGNOSIS — R42 Dizziness and giddiness: Secondary | ICD-10-CM

## 2012-11-23 DIAGNOSIS — Z794 Long term (current) use of insulin: Secondary | ICD-10-CM

## 2012-11-23 DIAGNOSIS — L97509 Non-pressure chronic ulcer of other part of unspecified foot with unspecified severity: Secondary | ICD-10-CM | POA: Diagnosis present

## 2012-11-23 DIAGNOSIS — K219 Gastro-esophageal reflux disease without esophagitis: Secondary | ICD-10-CM | POA: Diagnosis present

## 2012-11-23 DIAGNOSIS — S92502B Displaced unspecified fracture of left lesser toe(s), initial encounter for open fracture: Secondary | ICD-10-CM

## 2012-11-23 DIAGNOSIS — E1169 Type 2 diabetes mellitus with other specified complication: Principal | ICD-10-CM | POA: Diagnosis present

## 2012-11-23 DIAGNOSIS — IMO0001 Reserved for inherently not codable concepts without codable children: Secondary | ICD-10-CM | POA: Diagnosis present

## 2012-11-23 HISTORY — DX: Other complications of anesthesia, initial encounter: T88.59XA

## 2012-11-23 HISTORY — DX: Adverse effect of unspecified anesthetic, initial encounter: T41.45XA

## 2012-11-23 HISTORY — DX: Osteomyelitis, unspecified: M86.9

## 2012-11-23 MED ORDER — HYDROMORPHONE HCL PF 1 MG/ML IJ SOLN
1.0000 mg | Freq: Once | INTRAMUSCULAR | Status: AC
  Administered 2012-11-23: 1 mg via INTRAVENOUS
  Filled 2012-11-23: qty 1

## 2012-11-23 MED ORDER — ONDANSETRON HCL 4 MG/2ML IJ SOLN
4.0000 mg | Freq: Once | INTRAMUSCULAR | Status: AC
  Administered 2012-11-23: 4 mg via INTRAVENOUS
  Filled 2012-11-23: qty 2

## 2012-11-23 MED ORDER — VANCOMYCIN HCL IN DEXTROSE 1-5 GM/200ML-% IV SOLN
1000.0000 mg | Freq: Once | INTRAVENOUS | Status: AC
  Administered 2012-11-23: 1000 mg via INTRAVENOUS
  Filled 2012-11-23: qty 200

## 2012-11-23 NOTE — ED Notes (Signed)
Patient presents with c/o left foot pain, redness and drainage that started 7 days ago (11/16/12). Saw Dr. [name] (ortho) on Tuesday 11/18/12 and was given Rx Doxycyline 100 mg BID. Patient has f/u appt with Dr. [name] on 11/26/12. 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 Patient endorses chills. Denies sweats or fevers.

## 2012-11-23 NOTE — ED Provider Notes (Signed)
History     CSN: [id number]  Arrival date & time 11/23/12  2242   First MD Initiated Contact with Patient 11/23/12 2302      Chief Complaint  Patient presents with  . Foot Pain    (Consider location/radiation/quality/duration/timing/severity/associated sxs/prior treatment) Patient is a 57 y.o. male presenting with lower extremity pain. The history is provided by the patient.  Foot Pain  He has been having pain in his left foot for the last week and has been getting worse. He saw his orthopedic physician 5 days ago who started him on doxycycline. Since then, it has been more painful and more swollen and started draining including draining blood. Pain has been severe and he rates it at 10/10. He has had chills without fever or sweats. He is also noticing swelling of his left lower leg. He has a history of osteomyelitis of the right foot and had several toes amputated because of that. He is diabetic and has a history of peripheral vascular disease with stents placed.  Past Medical History  Diagnosis Date  . Carotid artery occlusion 11/10/10    LEFT CAROTID ENDARTERECTOMY  . Diabetes mellitus   . Hypertension   . Hyperlipidemia   . COPD (chronic obstructive pulmonary disease)   . DJD (degenerative joint disease)   . GERD (gastroesophageal reflux disease)   . Substernal chest pain   . 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 Slurred speech     AS PER WIFE IN D/C NOTE 11/10/10  . PAD (peripheral artery disease)     Distal aortogram June 2012. Atherectomy left popliteal artery July 2012.   . Barrett's esophagus   . Fatty liver   . Diverticulosis of colon (without mention of hemorrhage)   . Sleep apnea   . Emphysema of lung   . Osteomyelitis     Past Surgical History  Procedure Laterality Date  . Laminectomy      X 3 LUMBAR AND X 2 CERVICAL SPINE OPERATIONS  . Carotid endarterectomy  11/10/10  . Hernia repair      LEFT INGUINAL AND UMBILICAL REPAIRS  .  Anterior fusion cervical spine  02/06/06    C4-5, C5-6, C6-7; SURGEON DR. MAX COHEN  . Penile prosthesis implant  08/14/05    INFRAPUBIC INSERTION OF INFLATABLE PENILE PROSTHESIS; SURGEON DR. Amalia Hailey  . Cardiac catheterization  10/31/04  . Laparoscopic cholecystectomy w/ cholangiography  11/09/04    SURGEON DR. Luella Cook  . Total knee arthroplasty  07/2002    RIGHT KNEE ; SURGEON  DR. [name] ALSO HAD ARTHROSCOPIC RIGHT KNEE IN  10/2001  . Shoulder arthroscopy    . Cholecystectomy    . Amputation  11/05/2011    Procedure: AMPUTATION RAY;  Surgeon: Wylene Simmer, MD;  Location: [place];  Service: Orthopedics;  Laterality: Right;  Amputation of Right 4&5th Toes  . Femoral artery stent      x6    Family History  Problem Relation Age of Onset  . Heart disease Father   . Diabetes Father   . Cancer Father     PROSTATE  . Arthritis      GRANDMOTHER  . Hypertension      OTHER FAMILY MEMBERS  . Colon cancer Brother     History  Substance Use Topics  . Smoking status: Former Smoker -- 0.50 packs/day for 35 years    Types: Cigarettes    Quit date: 10/31/2011  . Smokeless tobacco:  Never Used  . Alcohol Use: No     Comment: occasionally       Review of Systems  All other systems reviewed and are negative.    Allergies  Adhesive  Home Medications   Current Outpatient Rx  Name  Route  Sig  Dispense  Refill  . ALPRAZolam (XANAX) 0.5 MG tablet   Oral   Take 0.5 mg by mouth daily.          Marland Kitchen amLODipine-valsartan (EXFORGE) 10-320 MG per tablet   Oral   Take 1 tablet by mouth daily.          Marland Kitchen aspirin 81 MG EC tablet   Oral   Take 81 mg by mouth 2 (two) times daily.          . B Complex-C (B-COMPLEX WITH VITAMIN C) tablet   Oral   Take 1 tablet by mouth daily.         . carvedilol (COREG) 12.5 MG tablet   Oral   Take 25 mg by mouth 2 (two) times daily with a meal.          . Choline Fenofibrate (TRILIPIX) 135 MG capsule   Oral   Take 135 mg by mouth daily.           . clopidogrel (PLAVIX) 75 MG tablet      1 tab daily         . ezetimibe (ZETIA) 10 MG tablet   Oral   Take 10 mg by mouth every morning.         . fentaNYL (DURAGESIC - DOSED MCG/HR) 50 MCG/HR   Transdermal   Place 1 patch onto the skin every 3 (three) days.         Marland Kitchen FLUoxetine (PROZAC) 20 MG capsule   Oral   Take 20 mg by mouth daily.          . furosemide (LASIX) 20 MG tablet   Oral   Take 20 mg by mouth daily.         Marland Kitchen glyBURIDE-metformin (GLUCOVANCE) 2.5-500 MG per tablet   Oral   Take 2 tablets by mouth 2 (two) times daily.          . insulin glargine (LANTUS) 100 UNIT/ML injection   Subcutaneous   Inject 50 Units into the skin 2 (two) times daily.          . insulin glulisine (APIDRA) 100 UNIT/ML injection   Subcutaneous   Inject 10-20 Units into the skin 3 (three) times daily before meals. 10 breakfast 10 lunch and 20 supper         . lansoprazole (PREVACID) 30 MG capsule   Oral   Take 30 mg by mouth 2 (two) times daily.         Marland Kitchen omega-3 acid ethyl esters (LOVAZA) 1 G capsule   Oral   Take 2 g by mouth 2 (two) times daily.          Marland Kitchen oxyCODONE-acetaminophen (TYLOX) 5-500 MG per capsule   Oral   Take 2 capsules by mouth every 8 (eight) hours as needed. For pain         . pantoprazole (PROTONIX) 20 MG tablet   Oral   Take 1 tablet (20 mg total) by mouth daily.   30 tablet   11   . POTASSIUM PO   Oral   Take 1 tablet by mouth daily.         . pregabalin (  LYRICA) 75 MG capsule   Oral   Take 75 mg by mouth 3 (three) times daily.          . promethazine (PHENERGAN) 12.5 MG tablet   Oral   Take 1 tablet (12.5 mg total) by mouth every 6 (six) hours as needed for nausea.   60 tablet   1   . rOPINIRole (REQUIP) 2 MG tablet   Oral   Take 2 mg by mouth at bedtime.          . Tamsulosin HCl (FLOMAX) 0.4 MG CAPS   Oral   Take 0.4 mg by mouth daily.         . traMADol (ULTRAM) 50 MG tablet   Oral   Take 50 mg  by mouth every 6 (six) hours as needed. For pain         . triamterene-hydrochlorothiazide (DYAZIDE) 37.5-25 MG per capsule   Oral   Take 1 capsule by mouth daily.            BP 168/69  Pulse 91  Temp(Src) 98.7 F (37.1 C) (Oral)  Resp 15  SpO2 98%  Physical Exam  Nursing note and vitals reviewed.  57 year old male, resting comfortably and in no acute distress. Vital signs are significant for hypertension with blood pressure 116/69. Oxygen saturation is 98%, which is normal. Head is normocephalic and atraumatic. PERRLA, EOMI. Oropharynx is clear. Neck is nontender and supple without adenopathy or JVD. Back is nontender and there is no CVA tenderness. Lungs are clear without rales, wheezes, or rhonchi. Chest is nontender. Heart has regular rate and rhythm without murmur. Abdomen is soft, flat, nontender without masses or hepatosplenomegaly and peristalsis is normoactive. Extremities: There is 1+ edema of the right leg and 3+ edema of the left leg. Left foot is erythematous and swollen with maximum erythema and swelling isolated to the left fourth toe. There is a break in the skin on the lateral aspect of the left fourth toe with some slight purulent drainage. There is mild warmth of the left foot and moderate pain with range of motion of the left fourth toe. Capillary refill is 2 seconds. Skin is warm and dry without rash. Neurologic: Mental status is normal, cranial nerves are intact, there are no motor or sensory deficits.  ED Course  Procedures (including critical care time)  Results for orders placed during the hospital encounter of 11/23/12  CBC WITH DIFFERENTIAL      Result Value Range   WBC 8.8  4.0 - 10.5 K/uL   RBC 3.52 (*) 4.22 - 5.81 MIL/uL   Hemoglobin 10.4 (*) 13.0 - 17.0 g/dL   HCT 28.5 (*) 39.0 - 52.0 %   MCV 81.0  78.0 - 100.0 fL   MCH 29.5  26.0 - 34.0 pg   MCHC 36.5 (*) 30.0 - 36.0 g/dL   RDW 13.0  11.5 - 15.5 %   Platelets 232  150 - 400 K/uL    Neutrophils Relative 70  43 - 77 %   Neutro Abs 6.1  1.7 - 7.7 K/uL   Lymphocytes Relative 17  12 - 46 %   Lymphs Abs 1.5  0.7 - 4.0 K/uL   Monocytes Relative 12  3 - 12 %   Monocytes Absolute 1.0  0.1 - 1.0 K/uL   Eosinophils Relative 2  0 - 5 %   Eosinophils Absolute 0.2  0.0 - 0.7 K/uL   Basophils Relative 0  0 - 1 %  Basophils Absolute 0.0  0.0 - 0.1 K/uL  BASIC METABOLIC PANEL      Result Value Range   Sodium 132 (*) 135 - 145 mEq/L   Potassium 3.7  3.5 - 5.1 mEq/L   Chloride 95 (*) 96 - 112 mEq/L   CO2 27  19 - 32 mEq/L   Glucose, Bld 341 (*) 70 - 99 mg/dL   BUN 25 (*) 6 - 23 mg/dL   Creatinine, Ser 1.21  0.50 - 1.35 mg/dL   Calcium 9.2  8.4 - 10.5 mg/dL   GFR calc non Af Amer 64 (*) >90 mL/min   GFR calc Af Amer 75 (*) >90 mL/min  SEDIMENTATION RATE      Result Value Range   Sed Rate 68 (*) 0 - 16 mm/hr   Dg Foot Complete Left  11/23/2012  *RADIOLOGY REPORT*  Clinical Data: Left foot pain.  Swelling, particularly involving the fourth toe.  LEFT FOOT - COMPLETE 3+ VIEW  Comparison: None.  Findings: Fractures involving both the base and the head of the proximal phalanx of the fourth toe, both fractures extending to the articular surfaces.  No other fractures.  Joint space narrowing involving IP joints of multiple toes.  Well-preserved bone mineral density.  Well-preserved joint spaces otherwise.  Small plantar calcaneal spur.  Soft tissue swelling involving the dorsum of the foot.  IMPRESSION: Fractures involving both the base and the head of the proximal phalanx of the fourth toe, with both fractures extending to the articular surfaces.   Original Report Authenticated By: Evangeline Dakin, M.D.     Images viewed by me.   1. Cellulitis of left foot   2. Fracture of fourth toe, left, open, initial encounter       MDM  Infection of the left foot very worrisome for osteomyelitis. You'll be sent for x-ray of the foot and laboratory workup initiated. MRI is not available at  this time. He'll be empirically started on vancomycin and will need to be admitted as a treatment failure.  X-rays do not show evidence of osteomyelitis. However, there no acute fractures of the left fourth toe. In this area is directly under the draining ulcer and this is at high risk of infection. Case is been discussed with Dr. Ernestina Patches of [facility] who agrees to admit the patient.      Delora Fuel, MD [phone] [phone]

## 2012-11-24 ENCOUNTER — Inpatient Hospital Stay (HOSPITAL_COMMUNITY): Payer: BC Managed Care – PPO

## 2012-11-24 DIAGNOSIS — L97529 Non-pressure chronic ulcer of other part of left foot with unspecified severity: Secondary | ICD-10-CM | POA: Diagnosis present

## 2012-11-24 DIAGNOSIS — L02619 Cutaneous abscess of unspecified foot: Secondary | ICD-10-CM

## 2012-11-24 LAB — GLUCOSE, CAPILLARY
Glucose-Capillary: 350 mg/dL — ABNORMAL HIGH (ref 70–99)
Glucose-Capillary: 107 mg/dL — ABNORMAL HIGH (ref 70–99)
Glucose-Capillary: 310 mg/dL — ABNORMAL HIGH (ref 70–99)
Glucose-Capillary: 138 mg/dL — ABNORMAL HIGH (ref 70–99)
Glucose-Capillary: 172 mg/dL — ABNORMAL HIGH (ref 70–99)

## 2012-11-24 LAB — HEMOGLOBIN A1C
Hgb A1c MFr Bld: 7.6 % — ABNORMAL HIGH (ref <5.7)
Mean Plasma Glucose: 171 mg/dL — ABNORMAL HIGH (ref <117)

## 2012-11-24 LAB — COMPREHENSIVE METABOLIC PANEL
ALT: 9 U/L (ref 0–53)
AST: 9 U/L (ref 0–37)
Albumin: 3.3 g/dL — ABNORMAL LOW (ref 3.5–5.2)
Alkaline Phosphatase: 122 U/L — ABNORMAL HIGH (ref 39–117)
BUN: 19 mg/dL (ref 6–23)
CO2: 29 mEq/L (ref 19–32)
Calcium: 9.1 mg/dL (ref 8.4–10.5)
Chloride: 100 mEq/L (ref 96–112)
Creatinine, Ser: 1.03 mg/dL (ref 0.50–1.35)
GFR calc Af Amer: >90 mL/min (ref >90)
GFR calc non Af Amer: 78 mL/min — ABNORMAL LOW (ref >90)
Glucose, Bld: 80 mg/dL (ref 70–99)
Potassium: 3.2 mEq/L — ABNORMAL LOW (ref 3.5–5.1)
Sodium: 138 mEq/L (ref 135–145)
Total Bilirubin: 0.3 mg/dL (ref 0.3–1.2)
Total Protein: 6.4 g/dL (ref 6.0–8.3)

## 2012-11-24 LAB — CBC WITH DIFFERENTIAL/PLATELET
Basophils Absolute: 0 10*3/uL (ref 0.0–0.1)
Basophils Relative: 0 % (ref 0–1)
Eosinophils Absolute: 0.2 10*3/uL (ref 0.0–0.7)
Eosinophils Relative: 2 % (ref 0–5)
HCT: 28.5 % — ABNORMAL LOW (ref 39.0–52.0)
Hemoglobin: 10.4 g/dL — ABNORMAL LOW (ref 13.0–17.0)
Lymphocytes Relative: 17 % (ref 12–46)
Lymphs Abs: 1.5 10*3/uL (ref 0.7–4.0)
MCH: 29.5 pg (ref 26.0–34.0)
MCHC: 36.5 g/dL — ABNORMAL HIGH (ref 30.0–36.0)
MCV: 81 fL (ref 78.0–100.0)
Monocytes Absolute: 1 10*3/uL (ref 0.1–1.0)
Monocytes Relative: 12 % (ref 3–12)
Neutro Abs: 6.1 10*3/uL (ref 1.7–7.7)
Neutrophils Relative %: 70 % (ref 43–77)
Platelets: 232 10*3/uL (ref 150–400)
RBC: 3.52 MIL/uL — ABNORMAL LOW (ref 4.22–5.81)
RDW: 13 % (ref 11.5–15.5)
WBC: 8.8 10*3/uL (ref 4.0–10.5)

## 2012-11-24 LAB — CBC
HCT: 27.3 % — ABNORMAL LOW (ref 39.0–52.0)
Hemoglobin: 10 g/dL — ABNORMAL LOW (ref 13.0–17.0)
MCH: 29.7 pg (ref 26.0–34.0)
MCHC: 36.6 g/dL — ABNORMAL HIGH (ref 30.0–36.0)
MCV: 81 fL (ref 78.0–100.0)
Platelets: 220 10*3/uL (ref 150–400)
RBC: 3.37 MIL/uL — ABNORMAL LOW (ref 4.22–5.81)
RDW: 12.9 % (ref 11.5–15.5)
WBC: 8.4 10*3/uL (ref 4.0–10.5)

## 2012-11-24 LAB — BASIC METABOLIC PANEL
BUN: 25 mg/dL — ABNORMAL HIGH (ref 6–23)
CO2: 27 mEq/L (ref 19–32)
Calcium: 9.2 mg/dL (ref 8.4–10.5)
Chloride: 95 mEq/L — ABNORMAL LOW (ref 96–112)
Creatinine, Ser: 1.21 mg/dL (ref 0.50–1.35)
GFR calc Af Amer: 75 mL/min — ABNORMAL LOW (ref >90)
GFR calc non Af Amer: 64 mL/min — ABNORMAL LOW (ref >90)
Glucose, Bld: 341 mg/dL — ABNORMAL HIGH (ref 70–99)
Potassium: 3.7 mEq/L (ref 3.5–5.1)
Sodium: 132 mEq/L — ABNORMAL LOW (ref 135–145)

## 2012-11-24 LAB — SEDIMENTATION RATE: Sed Rate: 68 mm/hr — ABNORMAL HIGH (ref 0–16)

## 2012-11-24 LAB — CREATININE, SERUM
Creatinine, Ser: 1.09 mg/dL (ref 0.50–1.35)
GFR calc Af Amer: 85 mL/min — ABNORMAL LOW (ref >90)
GFR calc non Af Amer: 73 mL/min — ABNORMAL LOW (ref >90)

## 2012-11-24 MED ORDER — MORPHINE SULFATE 2 MG/ML IJ SOLN: 1.0000 mg | INTRAMUSCULAR | Status: DC | PRN

## 2012-11-24 MED ORDER — OMEGA-3-ACID ETHYL ESTERS 1 G PO CAPS
2.0000 g | ORAL_CAPSULE | Freq: Two times a day (BID) | ORAL | Status: DC
  Administered 2012-11-24 – 2012-11-27 (×7): 2 g via ORAL
  Filled 2012-11-24 (×10): qty 2

## 2012-11-24 MED ORDER — AMLODIPINE-OLMESARTAN 5-40 MG PO TABS: 1.0000 | ORAL_TABLET | Freq: Every day | ORAL | Status: DC

## 2012-11-24 MED ORDER — B COMPLEX-C PO TABS
1.0000 | ORAL_TABLET | Freq: Every day | ORAL | Status: DC
  Administered 2012-11-24 – 2012-11-27 (×4): 1 via ORAL
  Filled 2012-11-24 (×4): qty 1

## 2012-11-24 MED ORDER — HEPARIN SODIUM (PORCINE) 5000 UNIT/ML IJ SOLN
5000.0000 [IU] | Freq: Three times a day (TID) | INTRAMUSCULAR | Status: AC
  Administered 2012-11-24 (×3): 5000 [IU] via SUBCUTANEOUS
  Filled 2012-11-24 (×8): qty 1

## 2012-11-24 MED ORDER — EZETIMIBE 10 MG PO TABS
10.0000 mg | ORAL_TABLET | Freq: Every morning | ORAL | Status: DC
  Administered 2012-11-24 – 2012-11-27 (×4): 10 mg via ORAL
  Filled 2012-11-24 (×4): qty 1

## 2012-11-24 MED ORDER — HYDROMORPHONE HCL PF 1 MG/ML IJ SOLN
1.0000 mg | Freq: Once | INTRAMUSCULAR | Status: AC
  Administered 2012-11-24: 1 mg via INTRAVENOUS
  Filled 2012-11-24: qty 1

## 2012-11-24 MED ORDER — IRBESARTAN 300 MG PO TABS
300.0000 mg | ORAL_TABLET | Freq: Every day | ORAL | Status: DC
  Administered 2012-11-24: 300 mg via ORAL
  Filled 2012-11-24: qty 1

## 2012-11-24 MED ORDER — OLMESARTAN MEDOXOMIL 40 MG PO TABS
40.0000 mg | ORAL_TABLET | Freq: Every day | ORAL | Status: DC
  Administered 2012-11-25 – 2012-11-27 (×3): 40 mg via ORAL
  Filled 2012-11-24 (×3): qty 1

## 2012-11-24 MED ORDER — PROMETHAZINE HCL 12.5 MG PO TABS
12.5000 mg | ORAL_TABLET | Freq: Four times a day (QID) | ORAL | Status: DC | PRN
  Administered 2012-11-26: 12.5 mg via ORAL
  Filled 2012-11-24: qty 1

## 2012-11-24 MED ORDER — FLUOXETINE HCL 20 MG PO CAPS
20.0000 mg | ORAL_CAPSULE | Freq: Every day | ORAL | Status: DC
  Administered 2012-11-24 – 2012-11-27 (×4): 20 mg via ORAL
  Filled 2012-11-24 (×4): qty 1

## 2012-11-24 MED ORDER — ATORVASTATIN CALCIUM 40 MG PO TABS
40.0000 mg | ORAL_TABLET | Freq: Every day | ORAL | Status: DC
  Administered 2012-11-24 – 2012-11-27 (×4): 40 mg via ORAL
  Filled 2012-11-24 (×4): qty 1

## 2012-11-24 MED ORDER — HYDROMORPHONE HCL PF 1 MG/ML IJ SOLN
1.0000 mg | INTRAMUSCULAR | Status: DC | PRN
  Administered 2012-11-24 (×3): 2 mg via INTRAVENOUS
  Administered 2012-11-24 (×2): 1 mg via INTRAVENOUS
  Administered 2012-11-24 – 2012-11-25 (×2): 2 mg via INTRAVENOUS
  Administered 2012-11-26: 1 mg via INTRAVENOUS
  Filled 2012-11-24 (×2): qty 2
  Filled 2012-11-24: qty 1
  Filled 2012-11-24 (×3): qty 2
  Filled 2012-11-24 (×2): qty 1

## 2012-11-24 MED ORDER — ONDANSETRON 4 MG PO TBDP
4.0000 mg | ORAL_TABLET | Freq: Four times a day (QID) | ORAL | Status: DC | PRN
  Filled 2012-11-24 (×2): qty 1

## 2012-11-24 MED ORDER — CARVEDILOL 25 MG PO TABS
25.0000 mg | ORAL_TABLET | Freq: Two times a day (BID) | ORAL | Status: DC
  Administered 2012-11-24 – 2012-11-27 (×7): 25 mg via ORAL
  Filled 2012-11-24 (×9): qty 1

## 2012-11-24 MED ORDER — ROPINIROLE HCL 1 MG PO TABS
2.0000 mg | ORAL_TABLET | Freq: Every day | ORAL | Status: DC
  Administered 2012-11-24 – 2012-11-26 (×3): 2 mg via ORAL
  Filled 2012-11-24 (×4): qty 2

## 2012-11-24 MED ORDER — PREGABALIN 75 MG PO CAPS
75.0000 mg | ORAL_CAPSULE | Freq: Three times a day (TID) | ORAL | Status: DC
  Administered 2012-11-24 – 2012-11-27 (×10): 75 mg via ORAL
  Filled 2012-11-24 (×10): qty 3

## 2012-11-24 MED ORDER — VANCOMYCIN HCL 10 G IV SOLR
1250.0000 mg | Freq: Two times a day (BID) | INTRAVENOUS | Status: AC
  Administered 2012-11-24 – 2012-11-27 (×7): 1250 mg via INTRAVENOUS
  Filled 2012-11-24 (×7): qty 1250

## 2012-11-24 MED ORDER — FENTANYL 50 MCG/HR TD PT72
50.0000 ug | MEDICATED_PATCH | TRANSDERMAL | Status: DC
  Administered 2012-11-24 – 2012-11-27 (×2): 50 ug via TRANSDERMAL
  Filled 2012-11-24 (×2): qty 1

## 2012-11-24 MED ORDER — INSULIN ASPART 100 UNIT/ML ~~LOC~~ SOLN
0.0000 [IU] | SUBCUTANEOUS | Status: DC
  Administered 2012-11-24: 15 [IU] via SUBCUTANEOUS
  Administered 2012-11-24: 3 [IU] via SUBCUTANEOUS
  Administered 2012-11-24: 15 [IU] via SUBCUTANEOUS
  Administered 2012-11-24 – 2012-11-25 (×7): 4 [IU] via SUBCUTANEOUS
  Administered 2012-11-26 – 2012-11-27 (×2): 7 [IU] via SUBCUTANEOUS
  Administered 2012-11-27 (×2): 4 [IU] via SUBCUTANEOUS
  Administered 2012-11-27: 11 [IU] via SUBCUTANEOUS

## 2012-11-24 MED ORDER — SODIUM CHLORIDE 0.9 % IJ SOLN: 3.0000 mL | Freq: Two times a day (BID) | INTRAMUSCULAR | Status: DC

## 2012-11-24 MED ORDER — AMLODIPINE BESYLATE 5 MG PO TABS
5.0000 mg | ORAL_TABLET | Freq: Every day | ORAL | Status: DC
  Administered 2012-11-24 – 2012-11-27 (×4): 5 mg via ORAL
  Filled 2012-11-24 (×5): qty 1

## 2012-11-24 MED ORDER — POTASSIUM CHLORIDE CRYS ER 20 MEQ PO TBCR
40.0000 meq | EXTENDED_RELEASE_TABLET | Freq: Once | ORAL | Status: AC
  Administered 2012-11-24: 40 meq via ORAL
  Filled 2012-11-24: qty 2

## 2012-11-24 MED ORDER — PANTOPRAZOLE SODIUM 40 MG PO TBEC
40.0000 mg | DELAYED_RELEASE_TABLET | Freq: Every day | ORAL | Status: DC
  Administered 2012-11-24 – 2012-11-27 (×4): 40 mg via ORAL
  Filled 2012-11-24 (×4): qty 1

## 2012-11-24 MED ORDER — TAMSULOSIN HCL 0.4 MG PO CAPS
0.4000 mg | ORAL_CAPSULE | Freq: Every day | ORAL | Status: DC
  Administered 2012-11-24 – 2012-11-27 (×4): 0.4 mg via ORAL
  Filled 2012-11-24 (×4): qty 1

## 2012-11-24 MED ORDER — INSULIN GLARGINE 100 UNIT/ML ~~LOC~~ SOLN
50.0000 [IU] | Freq: Every day | SUBCUTANEOUS | Status: DC
  Administered 2012-11-24 – 2012-11-27 (×3): 50 [IU] via SUBCUTANEOUS
  Filled 2012-11-24 (×3): qty 0.5

## 2012-11-24 MED ORDER — VANCOMYCIN HCL 10 G IV SOLR
1500.0000 mg | INTRAVENOUS | Status: AC
  Administered 2012-11-24: 1500 mg via INTRAVENOUS
  Filled 2012-11-24: qty 1500

## 2012-11-24 MED ORDER — CLOPIDOGREL BISULFATE 75 MG PO TABS
75.0000 mg | ORAL_TABLET | Freq: Every day | ORAL | Status: DC
  Administered 2012-11-24 – 2012-11-27 (×3): 75 mg via ORAL
  Filled 2012-11-24 (×5): qty 1

## 2012-11-24 MED ORDER — ALPRAZOLAM 0.25 MG PO TABS
0.2500 mg | ORAL_TABLET | Freq: Three times a day (TID) | ORAL | Status: DC
  Administered 2012-11-24 – 2012-11-27 (×10): 0.25 mg via ORAL
  Filled 2012-11-24 (×10): qty 1

## 2012-11-24 MED ORDER — CIPROFLOXACIN IN D5W 400 MG/200ML IV SOLN
400.0000 mg | Freq: Two times a day (BID) | INTRAVENOUS | Status: DC
  Administered 2012-11-24 – 2012-11-27 (×7): 400 mg via INTRAVENOUS
  Filled 2012-11-24 (×10): qty 200

## 2012-11-24 MED ORDER — FUROSEMIDE 80 MG PO TABS
80.0000 mg | ORAL_TABLET | Freq: Every day | ORAL | Status: DC
  Administered 2012-11-24 – 2012-11-27 (×4): 80 mg via ORAL
  Filled 2012-11-24 (×4): qty 1

## 2012-11-24 NOTE — H&P (Addendum)
Hospitalist Admission History and Physical  Patient name: [name] Medical record number: [id number] Date of birth: April 10, 1956 Age: 56 y.o. Gender: male  Primary Care Provider: [name], MD   Chief Complaint: osteomyelitis History of Present Illness:This is a 57 y.o. year old male multiple medical problems including peripheral vascular disease status post multiple peripheral vascular stents, chronic pain on multiple narcotics, type 2 diabetes, hypertension, history of osteomyelitis status post right fourth and fifth toe amputation 10/2011 presenting with questionable osteomyelitis of the left fourth toe. Patient reports having progressive left foot pain over the past month. Patient states that pain progressively got worse and he went to see his primary orthopedist ([place]). Per the patient, he was placed on doxycycline for soft tissue coverage with plan for followup on March 19. Patient states that since he began antibiotics she's had progressive redness and worsening of pain. Patient initially noticed pus drainage prior to his orthopedic visit. Patient states this has returned over the past 2-3 days. Patient denies any fevers or chills. No nausea or vomiting. Blood sugars poorly controlled at home. Patient does have baseline diabetic neuropathy and only feels pain intermittently. Patient does report falling last week although is unsure of exact mechanism.  In the ED, left foot plain film was obtained (MRI not available) showing left fourth and fifth toe fractures. Sedimentation rate of 68. White count of 8. Wound culture was obtained. Patient given one dose of IV vancomycin.  Patient Active Problem List  Diagnosis  . PVD (peripheral vascular disease)  . HTN (hypertension)  . Tobacco abuse  . GERD (gastroesophageal reflux disease)  . Barrett's esophagus without dysplasia  . Occlusion and stenosis of carotid artery without mention of cerebral infarction  . Aftercare  following surgery of the circulatory system, NEC  . Diabetes mellitus  . Osteomyelitis of toe of right foot  . Onychomycosis  . PAD (peripheral artery disease)  . Dizziness  . Cellulitis  . Chronic total occlusion of artery of the extremities   Past Medical History: Past Medical History  Diagnosis Date  . Carotid artery occlusion 11/10/10    LEFT CAROTID ENDARTERECTOMY  . Diabetes mellitus   . Hypertension   . Hyperlipidemia   . COPD (chronic obstructive pulmonary disease)   . DJD (degenerative joint disease)   . GERD (gastroesophageal reflux disease)   . Substernal chest pain   . 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 Slurred speech     AS PER WIFE IN D/C NOTE 11/10/10  . PAD (peripheral artery disease)     Distal aortogram June 2012. Atherectomy left popliteal artery July 2012.   . Barrett's esophagus   . Fatty liver   . Diverticulosis of colon (without mention of hemorrhage)   . Sleep apnea   . Emphysema of lung   . Osteomyelitis     Past Surgical History: Past Surgical History  Procedure Laterality Date  . Laminectomy      X 3 LUMBAR AND X 2 CERVICAL SPINE OPERATIONS  . Carotid endarterectomy  11/10/10  . Hernia repair      LEFT INGUINAL AND UMBILICAL REPAIRS  . Anterior fusion cervical spine  02/06/06    C4-5, C5-6, C6-7; SURGEON DR. MAX COHEN  . Penile prosthesis implant  08/14/05    INFRAPUBIC INSERTION OF INFLATABLE PENILE PROSTHESIS; SURGEON DR. Amalia Hailey  . Cardiac catheterization  10/31/04  . Laparoscopic cholecystectomy w/ cholangiography  11/09/04    SURGEON DR.  Luella Cook  . Total knee arthroplasty  07/2002    RIGHT KNEE ; SURGEON  DR. [name] ALSO HAD ARTHROSCOPIC RIGHT KNEE IN  10/2001  . Shoulder arthroscopy    . Cholecystectomy    . Amputation  11/05/2011    Procedure: AMPUTATION RAY;  Surgeon: Wylene Simmer, MD;  Location: [facility];  Service: Orthopedics;  Laterality: Right;  Amputation of Right 4&5th Toes  . Femoral artery stent      x6     Social History: History   Social History  . Marital Status: Married    Spouse Name: N/A    Number of Children: N/A  . Years of Education: N/A   Occupational History  . TRUCK DRIVER    Social History Main Topics  . Smoking status: Former Smoker -- 0.50 packs/day for 35 years    Types: Cigarettes    Quit date: 10/31/2011  . Smokeless tobacco: Never Used  . Alcohol Use: No     Comment: occasionally   . Drug Use: No  . Sexually Active: None   Other Topics Concern  . None   Social History Narrative   HEAVY SMOKER AND CONTINUES TO SMOKE 1 PPD. DOES NOT EXERCISE REGULARLY.    Wife [phone] Lebron Quam). Has 2 sons and daughter. Still active                Family History: Family History  Problem Relation Age of Onset  . Heart disease Father   . Diabetes Father   . Cancer Father     PROSTATE  . Arthritis      GRANDMOTHER  . Hypertension      OTHER FAMILY MEMBERS  . Colon cancer Brother     Allergies: Allergies  Allergen Reactions  . Adhesive (Tape) Rash    Current Facility-Administered Medications  Medication Dose Route Frequency Provider Last Rate Last Dose  . ALPRAZolam Duanne Moron) tablet 0.25 mg  0.25 mg Oral TID Shanda Howells, MD      . amLODipine-olmesartan (AZOR) 5-40 MG per tablet 1 tablet  1 tablet Oral Daily Shanda Howells, MD      . atorvastatin (LIPITOR) tablet 40 mg  40 mg Oral Daily Shanda Howells, MD      . B-complex with vitamin C tablet 1 tablet  1 tablet Oral Daily Shanda Howells, MD      . carvedilol (COREG) tablet 25 mg  25 mg Oral BID WC Shanda Howells, MD      . ciprofloxacin (CIPRO) IVPB 400 mg  400 mg Intravenous Q12H Shanda Howells, MD      . clopidogrel (PLAVIX) tablet 75 mg  75 mg Oral Q breakfast Shanda Howells, MD      . ezetimibe (ZETIA) tablet 10 mg  10 mg Oral q morning - 10a Shanda Howells, MD      . fentaNYL ([facility] - dosed mcg/hr) patch 50 mcg  50 mcg Transdermal Q72H Shanda Howells, MD      . FLUoxetine (PROZAC) capsule 20  mg  20 mg Oral Daily Shanda Howells, MD      . furosemide (LASIX) tablet 80 mg  80 mg Oral Daily Shanda Howells, MD      . heparin injection 5,000 Units  5,000 Units Subcutaneous Q8H Shanda Howells, MD      . HYDROmorphone (DILAUDID) injection 1-2 mg  1-2 mg Intravenous Q3H PRN Shanda Howells, MD      . insulin aspart (novoLOG) injection 0-20 Units  0-20 Units Subcutaneous Q4H Shanda Howells,  MD      . insulin glargine (LANTUS) injection 50 Units  50 Units Subcutaneous Daily Shanda Howells, MD      . omega-3 acid ethyl esters (LOVAZA) capsule 2 g  2 g Oral BID Shanda Howells, MD      . ondansetron (ZOFRAN-ODT) disintegrating tablet 4 mg  4 mg Oral QID PRN Shanda Howells, MD      . pantoprazole (PROTONIX) EC tablet 40 mg  40 mg Oral Daily Shanda Howells, MD      . pregabalin (LYRICA) capsule 75 mg  75 mg Oral TID Shanda Howells, MD      . promethazine (PHENERGAN) tablet 12.5 mg  12.5 mg Oral Q6H PRN Shanda Howells, MD      . rOPINIRole (REQUIP) tablet 2 mg  2 mg Oral QHS Shanda Howells, MD      . sodium chloride 0.9 % injection 3 mL  3 mL Intravenous Q12H Shanda Howells, MD      . tamsulosin [facility]) capsule 0.4 mg  0.4 mg Oral Daily Shanda Howells, MD       Current Outpatient Prescriptions  Medication Sig Dispense Refill  . ALPRAZolam (XANAX) 0.5 MG tablet Take 0.25 mg by mouth 3 (three) times daily.       Marland Kitchen amLODipine-olmesartan (AZOR) 5-40 MG per tablet Take 1 tablet by mouth daily.      Marland Kitchen atorvastatin (LIPITOR) 40 MG tablet Take 40 mg by mouth daily.      . B Complex-C (B-COMPLEX WITH VITAMIN C) tablet Take 1 tablet by mouth daily.      . carvedilol (COREG) 25 MG tablet Take 25 mg by mouth 2 (two) times daily with a meal.      . clopidogrel (PLAVIX) 75 MG tablet 1 tab daily      . ezetimibe (ZETIA) 10 MG tablet Take 10 mg by mouth every morning.      . fentaNYL (DURAGESIC - DOSED MCG/HR) 50 MCG/HR Place 1 patch onto the skin every 3 (three) days.      Marland Kitchen FLUoxetine (PROZAC) 20 MG capsule Take 20 mg by  mouth daily.       . furosemide (LASIX) 80 MG tablet Take 80 mg by mouth daily.      Marland Kitchen glyBURIDE-metformin (GLUCOVANCE) 2.5-500 MG per tablet Take 2 tablets by mouth 2 (two) times daily.       Marland Kitchen HYDROmorphone (DILAUDID) 2 MG tablet Take 2 mg by mouth daily as needed for pain.      Marland Kitchen insulin glargine (LANTUS) 100 UNIT/ML injection Inject 50 Units into the skin 2 (two) times daily.       . insulin glulisine (APIDRA) 100 UNIT/ML injection Inject 10-20 Units into the skin 3 (three) times daily before meals. 10 breakfast 10 lunch and 20 supper      . lansoprazole (PREVACID) 30 MG capsule Take 30 mg by mouth 2 (two) times daily.      Marland Kitchen omega-3 acid ethyl esters (LOVAZA) 1 G capsule Take 2 g by mouth 2 (two) times daily.       . ondansetron (ZOFRAN-ODT) 4 MG disintegrating tablet Take 4 mg by mouth 4 (four) times daily as needed for nausea.      Marland Kitchen oxyCODONE-acetaminophen (TYLOX) 5-500 MG per capsule Take 2 capsules by mouth every 8 (eight) hours as needed. For pain      . POTASSIUM PO Take 1 tablet by mouth daily.      . pregabalin (LYRICA) 75 MG capsule Take 75 mg  by mouth 3 (three) times daily.       . promethazine (PHENERGAN) 12.5 MG tablet Take 1 tablet (12.5 mg total) by mouth every 6 (six) hours as needed for nausea.  60 tablet  1  . rOPINIRole (REQUIP) 2 MG tablet Take 2 mg by mouth at bedtime.       . Tamsulosin HCl (FLOMAX) 0.4 MG CAPS Take 0.4 mg by mouth daily.       Review Of Systems: 12 point ROS negative except as noted above in HPI.  Physical Exam: Filed Vitals:   11/24/12 0215  BP: 129/64  Pulse: 75  Temp:   Resp:     General: obese, up in bed, cooperative  HEENT: PERRLA and extra ocular movement intact Heart: S1, S2 normal, no murmur, rub or gallop, regular rate and rhythm Lungs: clear to auscultation, no wheezes or rales and unlabored breathing Abdomen: abdomen is soft without significant tenderness, masses, organomegaly or guarding Extremities: 2+ peripheral pulses,  bilateral 1-2+ edema, L 4th toe with active ulcer and purulent drainage along lateral aspect of toe, + bone exposure to deep palpation  Skin:no rashes Neurology: normal without focal findings and diabetic neuropathy in distal extremities bilaterally   Labs and Imaging: Lab Results  Component Value Date/Time   NA 132* 11/23/2012 11:35 PM   K 3.7 11/23/2012 11:35 PM   CL 95* 11/23/2012 11:35 PM   CO2 27 11/23/2012 11:35 PM   BUN 25* 11/23/2012 11:35 PM   CREATININE 1.21 11/23/2012 11:35 PM   CREATININE 1.00 02/01/2012 10:28 AM   GLUCOSE 341* 11/23/2012 11:35 PM   Lab Results  Component Value Date   WBC 8.8 11/23/2012   HGB 10.4* 11/23/2012   HCT 28.5* 11/23/2012   MCV 81.0 11/23/2012   PLT 232 11/23/2012   Lab Results  Component Value Date   ESRSEDRATE 68* 11/23/2012    Dg Foot Complete Left  11/23/2012  *RADIOLOGY REPORT*  Clinical Data: Left foot pain.  Swelling, particularly involving the fourth toe.  LEFT FOOT - COMPLETE 3+ VIEW  Comparison: None.  Findings: Fractures involving both the base and the head of the proximal phalanx of the fourth toe, both fractures extending to the articular surfaces.  No other fractures.  Joint space narrowing involving IP joints of multiple toes.  Well-preserved bone mineral density.  Well-preserved joint spaces otherwise.  Small plantar calcaneal spur.  Soft tissue swelling involving the dorsum of the foot.  IMPRESSION: Fractures involving both the base and the head of the proximal phalanx of the fourth toe, with both fractures extending to the articular surfaces.   Original Report Authenticated By: Evangeline Dakin, M.D.      Assessment and Plan: [name] is a 57 y.o. year old male presenting with osteomyelitis and incidental L 4th and 5th toe fracture.  Osteomyelitis/Toe fracture: Will place on vancomycin and Cipro for infectious coverage. Wound culture pending. Left foot MRI pending. Followup sedimentation rate in a.m. Peripheral vascular disease and  uncontrolled diabetes or contributing to this. Ortho followup in a.m. Patient also with noted fractures of the involved toe on x-ray. Suspect fall from last week in setting of diabetic neuropathy may be source of this.  Chronic Pain: On multiple narcotic medications outpatient including fentanyl patch, Dilaudid and Percocet. We'll streamline pain regimen. Continue fentanyl patch. IV Dilaudid when necessary. Continue Lyrica for neuropathic component.  Diabetes: Poorly controlled. Checking A1c. Basal Lantus at half home dose. Resistant sliding-scale regimen. Titrate with basal as clinically indicated.  Peripheral vascular disease: Extensive disease at baseline with multiple vessel stenting including femorals and carotids. Asymptomatic. Distal pulses intact. This in conjunction with poorly controlled diabetes is likely contributing to recurrent osteomyelitic infections. Continue antiplatelet medication.  Hypertension: Blood pressure stable. Followed by [facility] and heart and vascular. Last visit February 2014. Normal cardiac catheterization as of 2006 and normal Myoview is a 2012 per report, though with extensive vascular history. Continue home regimen. Continue on Lasix in setting of chronic edema. Unclear If pt needs cards consult/eval prior to OR. Will defer to rounding and consult team.   FEN/GI: Corrected sodium at 137. Saline lock IV. Continue PPI. Likely n.p.o. after midnight pending ortho consult. Prophylaxis: Subcutaneous heparin, PPI Disposition: Pending further evaluation Code Status: Full code       Shanda Howells MD  Pager: [phone]

## 2012-11-24 NOTE — Consult Note (Signed)
Harold Bolt, MD Chief Complaint: Left 4th toe ulceration History: He has been having pain in his left foot for the last week and has been getting worse. He saw his orthopedic physician 5 days ago who started him on doxycycline. Since then, it has been more painful and more swollen and started draining including draining blood. Pain has been severe and he rates it at 10/10. He has had chills without fever or sweats. He is also noticing swelling of his left lower leg. He has a history of osteomyelitis of the right foot and had several toes amputated because of that. He is diabetic and has a history of peripheral vascular disease with stents placed.  Past Medical History  Diagnosis Date  . Carotid artery occlusion 11/10/10    LEFT CAROTID ENDARTERECTOMY  . Diabetes mellitus   . Hypertension   . Hyperlipidemia   . COPD (chronic obstructive pulmonary disease)   . DJD (degenerative joint disease)   . GERD (gastroesophageal reflux disease)   . Substernal chest pain   . 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 Slurred speech     AS PER WIFE IN D/C NOTE 11/10/10  . PAD (peripheral artery disease)     Distal aortogram June 2012. Atherectomy left popliteal artery July 2012.   . Barrett's esophagus   . Fatty liver   . Diverticulosis of colon (without mention of hemorrhage)   . Sleep apnea   . Emphysema of lung   . Osteomyelitis     Allergies  Allergen Reactions  . Adhesive (Tape) Rash    No current facility-administered medications on file prior to encounter.   Current Outpatient Prescriptions on File Prior to Encounter  Medication Sig Dispense Refill  . ALPRAZolam (XANAX) 0.5 MG tablet Take 0.25 mg by mouth 3 (three) times daily.       . B Complex-C (B-COMPLEX WITH VITAMIN C) tablet Take 1 tablet by mouth daily.      . clopidogrel (PLAVIX) 75 MG tablet 1 tab daily      . ezetimibe (ZETIA) 10 MG tablet Take 10 mg by mouth every morning.      . fentaNYL  (DURAGESIC - DOSED MCG/HR) 50 MCG/HR Place 1 patch onto the skin every 3 (three) days.      Marland Kitchen FLUoxetine (PROZAC) 20 MG capsule Take 20 mg by mouth daily.       Marland Kitchen glyBURIDE-metformin (GLUCOVANCE) 2.5-500 MG per tablet Take 2 tablets by mouth 2 (two) times daily.       . insulin glargine (LANTUS) 100 UNIT/ML injection Inject 50 Units into the skin 2 (two) times daily.       . insulin glulisine (APIDRA) 100 UNIT/ML injection Inject 10-20 Units into the skin 3 (three) times daily before meals. 10 breakfast 10 lunch and 20 supper      . lansoprazole (PREVACID) 30 MG capsule Take 30 mg by mouth 2 (two) times daily.      Marland Kitchen omega-3 acid ethyl esters (LOVAZA) 1 G capsule Take 2 g by mouth 2 (two) times daily.       Marland Kitchen oxyCODONE-acetaminophen (TYLOX) 5-500 MG per capsule Take 2 capsules by mouth every 8 (eight) hours as needed. For pain      . POTASSIUM PO Take 1 tablet by mouth daily.      . pregabalin (LYRICA) 75 MG capsule Take 75 mg by mouth 3 (three) times daily.       . promethazine (PHENERGAN)  12.5 MG tablet Take 1 tablet (12.5 mg total) by mouth every 6 (six) hours as needed for nausea.  60 tablet  1  . rOPINIRole (REQUIP) 2 MG tablet Take 2 mg by mouth at bedtime.       . Tamsulosin HCl (FLOMAX) 0.4 MG CAPS Take 0.4 mg by mouth daily.        Physical Exam: Filed Vitals:   11/24/12 0255  BP: 134/65  Pulse: 70  Temp: 98.1 F (36.7 C)  Resp: 18  A+O X3 No SOB/CP Abd soft/NT 1+ DP/PT pulses bilaterally Compartments soft/NT Drainage from left toe.  Purulent odor and appearance to toe   Image: Dg Foot Complete Left  11/23/2012  *RADIOLOGY REPORT*  Clinical Data: Left foot pain.  Swelling, particularly involving the fourth toe.  LEFT FOOT - COMPLETE 3+ VIEW  Comparison: None.  Findings: Fractures involving both the base and the head of the proximal phalanx of the fourth toe, both fractures extending to the articular surfaces.  No other fractures.  Joint space narrowing involving IP joints of  multiple toes.  Well-preserved bone mineral density.  Well-preserved joint spaces otherwise.  Small plantar calcaneal spur.  Soft tissue swelling involving the dorsum of the foot.  IMPRESSION: Fractures involving both the base and the head of the proximal phalanx of the fourth toe, with both fractures extending to the articular surfaces.   Original Report Authenticated By: Evangeline Dakin, M.D.     A/P:  Patient with 4 day history of progressive pain and drainage from 4 left toe ulceration.  Able to probe to bone. Question of osteo.   Patient has been followed by Dr [name].  Will inform him and defer definitive management to him Await MRI Further recommendations pending

## 2012-11-24 NOTE — Progress Notes (Signed)
UR COMPLETED  

## 2012-11-24 NOTE — Progress Notes (Signed)
Patient ID: [name]  male  [id number]    DOB: 1956/05/11    DOA: 11/23/2012  PCP: [name], MD  Assessment/Plan: Principal Problem:   Foot ulcer, left, cellulitis, fracture in the toe - MRI left foot pending, patient also has fracture in the involved toe - Appreciate orthopedics followup, Dr. [name] to eval - cont vancomycin  Active Problems:   PVD (peripheral vascular disease): Continue Plavix    HTN (hypertension): Stable, borderline    Tobacco abuse: Place on nicotine patch    GERD (gastroesophageal reflux disease): Place on PPI    Diabetes mellitus - Placed on sliding scale insulin and Lantus - Follow blood sugars for further adjustment   DVT Prophylaxis: Heparin subcutaneous  Code Status:  Disposition:    Subjective: Complaining of left foot pain, awaiting MRI  Objective: Weight change:   Intake/Output Summary (Last 24 hours) at 11/24/12 1428 Last data filed at 11/24/12 1347  Gross per 24 hour  Intake    240 ml  Output   1000 ml  Net   -760 ml   Blood pressure 105/51, pulse 61, temperature 98.9 F (37.2 C), temperature source Oral, resp. rate 18, height 6\' 2"  (1.88 m), weight 107.548 kg (237 lb 1.6 oz), SpO2 98.00%.  Physical Exam: General: Alert and awake, oriented x3, not in any acute distress. HEENT: anicteric sclera, pupils reactive to light and accommodation, EOMI CVS: S1-S2 clear, no murmur rubs or gallops Chest: clear to auscultation bilaterally, no wheezing, rales or rhonchi Abdomen: soft nontender, nondistended, normal bowel sounds,  Extremities: no cyanosis, clubbing or edema noted bilaterally, left fourth toe with ulcer and drainage along the lateral aspect, 4th and 5th rt toe amputation   Lab Results: Basic Metabolic Panel:  Recent Labs Lab 11/23/12 2335 11/24/12 0217 11/24/12 0610  NA 132*  --  138  K 3.7  --  3.2*  CL 95*  --  100  CO2 27  --  29  GLUCOSE 341*  --  80  BUN 25*  --  19  CREATININE 1.21 1.09  1.03  CALCIUM 9.2  --  9.1   Liver Function Tests:  Recent Labs Lab 11/24/12 0610  AST 9  ALT 9  ALKPHOS 122*  BILITOT 0.3  PROT 6.4  ALBUMIN 3.3*   No results found for this basename: LIPASE, AMYLASE,  in the last 168 hours No results found for this basename: AMMONIA,  in the last 168 hours CBC:  Recent Labs Lab 11/23/12 2335 11/24/12 0217  WBC 8.8 8.4  NEUTROABS 6.1  --   HGB 10.4* 10.0*  HCT 28.5* 27.3*  MCV 81.0 81.0  PLT 232 220   Cardiac Enzymes: No results found for this basename: CKTOTAL, CKMB, CKMBINDEX, TROPONINI,  in the last 168 hours BNP: No components found with this basename: POCBNP,  CBG:  Recent Labs Lab 11/24/12 0306 11/24/12 0735 11/24/12 1049  GLUCAP 350* 107* 310*     Micro Results: No results found for this or any previous visit (from the past 240 hour(s)).  Studies/Results: Mr Foot Left Wo Contrast  11/24/2012  *RADIOLOGY REPORT*  Clinical Data: Peripheral vascular disease.  Diabetes.  History of osteomyelitis with prior right toe amputations.  Progressive left foot pain over the past month with worsening erythema and pain. Drainage of purulent material.  MRI OF THE LEFT FOREFOOT WITHOUT CONTRAST  Technique:  Multiplanar, multisequence MR imaging was performed. No intravenous contrast was administered.  Comparison: 11/23/2012  Findings: Abnormal osseous edema in  the proximal phalanx of the fourth toe observed with bony discontinuity in the shaft on image 19 of series 8, compatible with fracture.  Superimposed infection is not excluded.  There is also abnormal edema throughout the middle phalanx of the fourth toe, but with only equivocal low-level edema in the distal phalanx.  There is surrounding subcutaneous edema in the fourth toe. Remaining phalanges intact.  Low-level edema likely related to arthropathy noted along the Lisfranc joint and cuneiforms, particularly involving the middle cuneiform.  Low-level edema tracts along the plantar  musculature of the foot, and there is abnormal subcutaneous edema in the dorsal soft tissues of the foot especially laterally.  Mild peroneus longus tenosynovitis is present and there is mild flexor hallucis longus tenosynovitis the level of the knot of Henry.  IMPRESSION:  1.  Oblique midshaft fracture of the proximal phalanx of the fourth toe, with edema in the proximal phalanx and middle phalanx with surrounding subcutaneous edema. On conventional radiography, the fracture was noted to extend into the base and distal head of the proximal phalanx.  Although a fracture is present, superimposed osteomyelitis involving the proximal phalanx of the middle phalanx is not readily excluded. 2.  Abnormal edema in the dorsal subcutaneous soft tissues of the foot, in the fourth toe, and in the plantar musculature of the foot, potentially representing cellulitis and myositis. 3.  Degenerative findings along the Lisfranc joint.   Original Report Authenticated By: Van Clines, M.D.    Dg Foot Complete Left  11/23/2012  *RADIOLOGY REPORT*  Clinical Data: Left foot pain.  Swelling, particularly involving the fourth toe.  LEFT FOOT - COMPLETE 3+ VIEW  Comparison: None.  Findings: Fractures involving both the base and the head of the proximal phalanx of the fourth toe, both fractures extending to the articular surfaces.  No other fractures.  Joint space narrowing involving IP joints of multiple toes.  Well-preserved bone mineral density.  Well-preserved joint spaces otherwise.  Small plantar calcaneal spur.  Soft tissue swelling involving the dorsum of the foot.  IMPRESSION: Fractures involving both the base and the head of the proximal phalanx of the fourth toe, with both fractures extending to the articular surfaces.   Original Report Authenticated By: Evangeline Dakin, M.D.     Medications: Scheduled Meds: . ALPRAZolam  0.25 mg Oral TID  . amLODipine  5 mg Oral Daily  . atorvastatin  40 mg Oral Daily  . B-complex  with vitamin C  1 tablet Oral Daily  . carvedilol  25 mg Oral BID WC  . ciprofloxacin  400 mg Intravenous Q12H  . clopidogrel  75 mg Oral Q breakfast  . ezetimibe  10 mg Oral q morning - 10a  . fentaNYL  50 mcg Transdermal Q72H  . FLUoxetine  20 mg Oral Daily  . furosemide  80 mg Oral Daily  . heparin  5,000 Units Subcutaneous Q8H  . insulin aspart  0-20 Units Subcutaneous Q4H  . insulin glargine  50 Units Subcutaneous Daily  . [START ON 11/25/2012] olmesartan  40 mg Oral Daily  . omega-3 acid ethyl esters  2 g Oral BID  . pantoprazole  40 mg Oral Daily  . pregabalin  75 mg Oral TID  . rOPINIRole  2 mg Oral QHS  . sodium chloride  3 mL Intravenous Q12H  . tamsulosin  0.4 mg Oral Daily  . vancomycin  1,250 mg Intravenous Q12H      LOS: 1 day   RAI,RIPUDEEP M.D. [facility] 11/24/2012,  2:28 PM Pager: [phone]  If 7PM-7AM, please contact night-coverage [email or web address] Password [email or web address]

## 2012-11-24 NOTE — Progress Notes (Signed)
Inpatient Diabetes Program Recommendations  AACE/ADA: New Consensus Statement on Inpatient Glycemic Control (2013)  Target Ranges:  Prepandial:   less than 140 mg/dL      Peak postprandial:   less than 180 mg/dL (1-2 hours)      Critically ill patients:  140 - 180 mg/dL   Reason for Assessment: Hyperglycemia  Inpatient Diabetes Program Recommendations Insulin - Basal: Per Med Rec, patient takes Lantus 50 units BID.  Currently scheduled to receive Lantus 50 units daily. Insulin - Meal Coverage: Per Med Rec patient takes Apidra 10 units with breakfast and lunch and 20 units at supper as meal coverage.  No meal coverage ordered here.  Request MD consider adding Novolog 6 units tid with meals as meal coverage-- to be given with correction as long as CBG> 80 mg/dl and patient eats at least 50%. Oral Agents: On GlucoVance 2.5-500 2 tabs BID at home prior to admission per med rec.  Would not recommend patient receive OHA in the hospital due to risk of associated hypoglycemia.  HgbA1C: Hgb A1C was 9.4 on 11/01/2011. Diet: Receiving CHO Mod Medium diet  Note:  Results for DARUIS, RAUDABAUGH (MRN [id number]) as of 11/24/2012 11:36  Ref. Range 11/24/2012 03:06 11/24/2012 07:35 11/24/2012 10:49  Glucose-Capillary Latest Range: 70-99 mg/dL 350 (H) 107 (H) 310 (H)   In addition to above recommendations, please consider changing CBG's to ac & HS since she is eating. Thank you.  Sammantha Mehlhaff S. Marcelline Mates, RN, CNS, CDE Inpatient Diabetes Program, team pager [phone]

## 2012-11-24 NOTE — Progress Notes (Signed)
ANTIBIOTIC CONSULT NOTE - INITIAL  Pharmacy Consult for vancomycin Indication: r/o osteomyelitis  Allergies  Allergen Reactions  . Adhesive (Tape) Rash    Patient Measurements: Weight: 113kg  Vital Signs: Temp: 98.7 F (37.1 C) (03/16 2258) Temp src: Oral (03/16 2258) BP: 129/64 mmHg (03/17 0215) Pulse Rate: 75 (03/17 0215)  Labs:  Recent Labs  11/23/12 2335  WBC 8.8  HGB 10.4*  PLT 232  CREATININE 1.21    Microbiology: No results found for this or any previous visit (from the past 720 hour(s)).  Medical History: Past Medical History  Diagnosis Date  . Carotid artery occlusion 11/10/10    LEFT CAROTID ENDARTERECTOMY  . Diabetes mellitus   . Hypertension   . Hyperlipidemia   . COPD (chronic obstructive pulmonary disease)   . DJD (degenerative joint disease)   . GERD (gastroesophageal reflux disease)   . Substernal chest pain   . 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 Slurred speech     AS PER WIFE IN D/C NOTE 11/10/10  . PAD (peripheral artery disease)     Distal aortogram June 2012. Atherectomy left popliteal artery July 2012.   . Barrett's esophagus   . Fatty liver   . Diverticulosis of colon (without mention of hemorrhage)   . Sleep apnea   . Emphysema of lung   . Osteomyelitis      Assessment: 57yo male c/o left foot pain that has been worsening, had seen ortho 5d ago who Rx'd doxycycline, since then it has become more painful and swollen and has begun draining, has DM with h/o PVD, concerning for osteo, to begin IV ABX.  Goal of Therapy:  Vancomycin trough level 15-20 mcg/ml  Plan:  Rec'd vanc 1g IV in ED; will give additional vancomycin 1500mg  x1 for total load of 2.5g then start 1250mg  IV Q12H and monitor CBC, Cx, levels prn.  Wynona Neat, PharmD, BCPS  11/24/2012,2:24 AM

## 2012-11-25 ENCOUNTER — Encounter (HOSPITAL_COMMUNITY): Payer: Self-pay | Admitting: General Practice

## 2012-11-25 ENCOUNTER — Other Ambulatory Visit: Payer: Self-pay

## 2012-11-25 ENCOUNTER — Inpatient Hospital Stay (HOSPITAL_COMMUNITY): Payer: BC Managed Care – PPO

## 2012-11-25 DIAGNOSIS — M9080 Osteopathy in diseases classified elsewhere, unspecified site: Secondary | ICD-10-CM | POA: Diagnosis not present

## 2012-11-25 DIAGNOSIS — I10 Essential (primary) hypertension: Secondary | ICD-10-CM

## 2012-11-25 DIAGNOSIS — E119 Type 2 diabetes mellitus without complications: Secondary | ICD-10-CM

## 2012-11-25 DIAGNOSIS — K219 Gastro-esophageal reflux disease without esophagitis: Secondary | ICD-10-CM

## 2012-11-25 DIAGNOSIS — M869 Osteomyelitis, unspecified: Secondary | ICD-10-CM | POA: Diagnosis not present

## 2012-11-25 DIAGNOSIS — L97509 Non-pressure chronic ulcer of other part of unspecified foot with unspecified severity: Secondary | ICD-10-CM

## 2012-11-25 DIAGNOSIS — S92919B Unspecified fracture of unspecified toe(s), initial encounter for open fracture: Secondary | ICD-10-CM

## 2012-11-25 DIAGNOSIS — L0291 Cutaneous abscess, unspecified: Secondary | ICD-10-CM

## 2012-11-25 DIAGNOSIS — E1169 Type 2 diabetes mellitus with other specified complication: Secondary | ICD-10-CM | POA: Diagnosis not present

## 2012-11-25 DIAGNOSIS — I7092 Chronic total occlusion of artery of the extremities: Secondary | ICD-10-CM | POA: Diagnosis not present

## 2012-11-25 DIAGNOSIS — L0390 Cellulitis, unspecified: Secondary | ICD-10-CM

## 2012-11-25 LAB — GLUCOSE, CAPILLARY
Glucose-Capillary: 151 mg/dL — ABNORMAL HIGH (ref 70–99)
Glucose-Capillary: 154 mg/dL — ABNORMAL HIGH (ref 70–99)
Glucose-Capillary: 162 mg/dL — ABNORMAL HIGH (ref 70–99)
Glucose-Capillary: 162 mg/dL — ABNORMAL HIGH (ref 70–99)
Glucose-Capillary: 174 mg/dL — ABNORMAL HIGH (ref 70–99)
Glucose-Capillary: 190 mg/dL — ABNORMAL HIGH (ref 70–99)

## 2012-11-25 LAB — CBC WITH DIFFERENTIAL/PLATELET
Basophils Absolute: 0 10*3/uL (ref 0.0–0.1)
Basophils Relative: 1 % (ref 0–1)
Eosinophils Absolute: 0.2 10*3/uL (ref 0.0–0.7)
Eosinophils Relative: 3 % (ref 0–5)
HCT: 27.3 % — ABNORMAL LOW (ref 39.0–52.0)
Hemoglobin: 9.7 g/dL — ABNORMAL LOW (ref 13.0–17.0)
Lymphocytes Relative: 22 % (ref 12–46)
Lymphs Abs: 1.5 10*3/uL (ref 0.7–4.0)
MCH: 29.2 pg (ref 26.0–34.0)
MCHC: 35.5 g/dL (ref 30.0–36.0)
MCV: 82.2 fL (ref 78.0–100.0)
Monocytes Absolute: 0.6 10*3/uL (ref 0.1–1.0)
Monocytes Relative: 9 % (ref 3–12)
Neutro Abs: 4.5 10*3/uL (ref 1.7–7.7)
Neutrophils Relative %: 65 % (ref 43–77)
Platelets: 219 10*3/uL (ref 150–400)
RBC: 3.32 MIL/uL — ABNORMAL LOW (ref 4.22–5.81)
RDW: 13 % (ref 11.5–15.5)
WBC: 6.9 10*3/uL (ref 4.0–10.5)

## 2012-11-25 LAB — SEDIMENTATION RATE: Sed Rate: 55 mm/hr — ABNORMAL HIGH (ref 0–16)

## 2012-11-25 MED ORDER — SODIUM CHLORIDE 0.9 % IV SOLN
INTRAVENOUS | Status: DC
  Administered 2012-11-26: via INTRAVENOUS

## 2012-11-25 MED ORDER — CHLORHEXIDINE GLUCONATE 4 % EX LIQD
60.0000 mL | Freq: Once | CUTANEOUS | Status: AC
  Administered 2012-11-25: 4 via TOPICAL
  Filled 2012-11-25: qty 60

## 2012-11-25 NOTE — Progress Notes (Signed)
Subjective: 57 y/o male with PMH of diabetes and peripheral neuropathy is admitted now for infection of his left foot.  The pt is known to me from previous R 4th and 5th toe amputations.  He came to see me in clinic 4 days ago for a new ulcer on the lateral aspect of the left 4th toe.  We started dressing changes and oral abx.  Over the weekend he noted increased pain, swelling and purulent drainage from the left 4th toe ulcer.  He presented to the ER and was admitted.  He was started on vanc and zosyn.  He says the pain has decreased a lot.  He denies f/c/n/v/wt loss.  On subcut heparin for dvt prophylaxis.  Objective: Vital signs in last 24 hours: Temp:  [98.6 F (37 C)-98.9 F (37.2 C)] 98.6 F (37 C) 12-08-22 1500) Pulse Rate:  [63-74] 65 12-08-2022 1500) Resp:  [16-18] 18 2022-12-08 1500) BP: (99-130)/(52-65) 130/60 mmHg Dec 08, 2022 1500) SpO2:  [97 %-99 %] 99 % 12-08-2022 1500)  Intake/Output from previous day: 03/17 0701 - 12/08/2022 0700 In: 1780 [P.O.:480; IV Piggyback:1100] Out: 900 [Urine:900] Intake/Output this shift: Total I/O In: 240 [P.O.:240] Out: 2375 [Urine:2375]   Recent Labs  11/23/12 2335 11/24/12 0217 Dec 07, 2012 0625  HGB 10.4* 10.0* 9.7*    Recent Labs  11/24/12 0217 2012/12/07 0625  WBC 8.4 6.9  RBC 3.37* 3.32*  HCT 27.3* 27.3*  PLT 220 219    Recent Labs  11/23/12 2335 11/24/12 0217 11/24/12 0610  NA 132*  --  138  K 3.7  --  3.2*  CL 95*  --  100  CO2 27  --  29  BUN 25*  --  19  CREATININE 1.21 1.09 1.03  GLUCOSE 341*  --  80  CALCIUM 9.2  --  9.1   No results found for this basename: LABPT, INR,  in the last 72 hours  PE:  wn wd male in nad.  A and O.  Mood and affect normal. EOMI.  Respirations unlabored.  L 4th toe swollen.  Crepitus with ROM but no pain.  5/5 strength in PF and DF of the ankle.  Sens to LT at the toes is diminished.  Brisk cap refill at the toes.  No lymphadenopathy at the ankle.  Wound at the lateral 4th toe has increased in size and the  lateral aspect of the proximal phalanx and the fracture are exposed.  There is scant purulent drainage.  MRI and xrays show a 4th toe proximal phalanx fracture and osteomyelitis of the proximal phalanx can't be excluded in the face of a 4th toe proximal phalanx fracture.  Assessment/Plan: L 4th toe diabetic ulcer, proximal phalanx fracture and osteomyelitis - given the exposed bone at the diabetic ulcer, the patient has osteomyelitis.  As a result, I believe amputation is required to treat this infection.  I've explained the treatment options to the patient in detail as well as their relative risks and benefits.  He elects to proceed with amputation of the 4th toe as definitive treatment of the osteomyelitis.  The risks and benefits of the alternative treatment options have been discussed in detail.  The patient wishes to proceed with surgery and specifically understands risks of bleeding, infection, nerve damage, blood clots, need for additional surgery, amputation and death.     Wylene Simmer December 07, 2012, 3:25 PM

## 2012-11-25 NOTE — Progress Notes (Signed)
Patient ID: [name]  male  [id number]    DOB: December 02, 1955    DOA: 11/23/2012  PCP: [name], MD  Assessment/Plan: Principal Problem:   Foot ulcer, left, cellulitis, fracture in the toe - MRI left foot showed oblique midshaft fracture of proximal phalanx of the fourth toe surrounding subcutaneous edema, superimposed osteomyelitis cannot be ruled out - Appreciate orthopedics followup, Dr. [name] to eval today, will continue n.p.o. until orthopedics evaluation - cont vancomycin and ciprofloxacin  Active Problems:   PVD (peripheral vascular disease): Continue Plavix    HTN (hypertension): Stable, borderline    Tobacco abuse: Place on nicotine patch    GERD (gastroesophageal reflux disease): Place on PPI    Diabetes mellitus - Placed on sliding scale insulin and Lantus - Currently n.p.o., no changes in insulin  DVT Prophylaxis: Heparin subcutaneous  Code Status:  Disposition:    Subjective: Left foot pain somewhat controlled, awaiting orthopedic evaluation  Objective: Weight change:   Intake/Output Summary (Last 24 hours) at 11/25/12 1117 Last data filed at 11/25/12 0801  Gross per 24 hour  Intake   1780 ml  Output   1675 ml  Net    105 ml   Blood pressure 114/65, pulse 74, temperature 98.9 F (37.2 C), temperature source Oral, resp. rate 16, height 6\' 2"  (1.88 m), weight 107.548 kg (237 lb 1.6 oz), SpO2 98.00%.  Physical Exam: General: A xOx 3, not in any acute distress. CVS: S1-S2 clear, no murmur rubs or gallops Chest: CTAB Abdomen: soft NT, ND, NBS  Extremities: no c/c/e left 4th toe with ulcer and drainage along the lateral aspect, Rt 4th and 5th toe amputation   Lab Results: Basic Metabolic Panel:  Recent Labs Lab 11/23/12 2335 11/24/12 0217 11/24/12 0610  NA 132*  --  138  K 3.7  --  3.2*  CL 95*  --  100  CO2 27  --  29  GLUCOSE 341*  --  80  BUN 25*  --  19  CREATININE 1.21 1.09 1.03  CALCIUM 9.2  --  9.1   Liver Function  Tests:  Recent Labs Lab 11/24/12 0610  AST 9  ALT 9  ALKPHOS 122*  BILITOT 0.3  PROT 6.4  ALBUMIN 3.3*   CBC:  Recent Labs Lab 11/24/12 0217 11/25/12 0625  WBC 8.4 6.9  NEUTROABS  --  4.5  HGB 10.0* 9.7*  HCT 27.3* 27.3*  MCV 81.0 82.2  PLT 220 219   CBG:  Recent Labs Lab 11/24/12 1618 11/24/12 2009 11/25/12 0038 11/25/12 0410 11/25/12 0720  GLUCAP 138* 172* 151* 162* 174*     Micro Results: Recent Results (from the past 240 hour(s))  WOUND CULTURE     Status: None   Collection Time    11/24/12 12:23 AM      Result Value Range Status   Specimen Description WOUND TOE   Final   Special Requests NONE   Final   Gram Stain     Final   Value: FEW WBC PRESENT, PREDOMINANTLY PMN     NO SQUAMOUS EPITHELIAL CELLS SEEN     FEW GRAM POSITIVE COCCI IN PAIRS   Culture Culture reincubated for better growth   Final   Report Status PENDING   Incomplete    Studies/Results: Mr Foot Left Wo Contrast  11/24/2012  *RADIOLOGY REPORT*  Clinical Data: Peripheral vascular disease.  Diabetes.  History of osteomyelitis with prior right toe amputations.  Progressive left foot pain over the past month  with worsening erythema and pain. Drainage of purulent material.  MRI OF THE LEFT FOREFOOT WITHOUT CONTRAST  Technique:  Multiplanar, multisequence MR imaging was performed. No intravenous contrast was administered.  Comparison: 11/23/2012  Findings: Abnormal osseous edema in the proximal phalanx of the fourth toe observed with bony discontinuity in the shaft on image 19 of series 8, compatible with fracture.  Superimposed infection is not excluded.  There is also abnormal edema throughout the middle phalanx of the fourth toe, but with only equivocal low-level edema in the distal phalanx.  There is surrounding subcutaneous edema in the fourth toe. Remaining phalanges intact.  Low-level edema likely related to arthropathy noted along the Lisfranc joint and cuneiforms, particularly involving the  middle cuneiform.  Low-level edema tracts along the plantar musculature of the foot, and there is abnormal subcutaneous edema in the dorsal soft tissues of the foot especially laterally.  Mild peroneus longus tenosynovitis is present and there is mild flexor hallucis longus tenosynovitis the level of the knot of Henry.  IMPRESSION:  1.  Oblique midshaft fracture of the proximal phalanx of the fourth toe, with edema in the proximal phalanx and middle phalanx with surrounding subcutaneous edema. On conventional radiography, the fracture was noted to extend into the base and distal head of the proximal phalanx.  Although a fracture is present, superimposed osteomyelitis involving the proximal phalanx of the middle phalanx is not readily excluded. 2.  Abnormal edema in the dorsal subcutaneous soft tissues of the foot, in the fourth toe, and in the plantar musculature of the foot, potentially representing cellulitis and myositis. 3.  Degenerative findings along the Lisfranc joint.   Original Report Authenticated By: Van Clines, M.D.    Dg Foot Complete Left  11/23/2012  *RADIOLOGY REPORT*  Clinical Data: Left foot pain.  Swelling, particularly involving the fourth toe.  LEFT FOOT - COMPLETE 3+ VIEW  Comparison: None.  Findings: Fractures involving both the base and the head of the proximal phalanx of the fourth toe, both fractures extending to the articular surfaces.  No other fractures.  Joint space narrowing involving IP joints of multiple toes.  Well-preserved bone mineral density.  Well-preserved joint spaces otherwise.  Small plantar calcaneal spur.  Soft tissue swelling involving the dorsum of the foot.  IMPRESSION: Fractures involving both the base and the head of the proximal phalanx of the fourth toe, with both fractures extending to the articular surfaces.   Original Report Authenticated By: Evangeline Dakin, M.D.     Medications: Scheduled Meds: . ALPRAZolam  0.25 mg Oral TID  . amLODipine  5 mg  Oral Daily  . atorvastatin  40 mg Oral Daily  . B-complex with vitamin C  1 tablet Oral Daily  . carvedilol  25 mg Oral BID WC  . ciprofloxacin  400 mg Intravenous Q12H  . clopidogrel  75 mg Oral Q breakfast  . ezetimibe  10 mg Oral q morning - 10a  . fentaNYL  50 mcg Transdermal Q72H  . FLUoxetine  20 mg Oral Daily  . furosemide  80 mg Oral Daily  . heparin  5,000 Units Subcutaneous Q8H  . insulin aspart  0-20 Units Subcutaneous Q4H  . insulin glargine  50 Units Subcutaneous Daily  . olmesartan  40 mg Oral Daily  . omega-3 acid ethyl esters  2 g Oral BID  . pantoprazole  40 mg Oral Daily  . pregabalin  75 mg Oral TID  . rOPINIRole  2 mg Oral QHS  . sodium chloride  3 mL Intravenous Q12H  . tamsulosin  0.4 mg Oral Daily  . vancomycin  1,250 mg Intravenous Q12H      LOS: 2 days   RAI,RIPUDEEP M.D. [facility] 11/25/2012, 11:17 AM Pager: [phone]  If 7PM-7AM, please contact night-coverage [email or web address] Password [email or web address]

## 2012-11-26 ENCOUNTER — Encounter (HOSPITAL_COMMUNITY): Payer: Self-pay | Admitting: Anesthesiology

## 2012-11-26 ENCOUNTER — Inpatient Hospital Stay (HOSPITAL_COMMUNITY): Payer: BC Managed Care – PPO | Admitting: Anesthesiology

## 2012-11-26 ENCOUNTER — Encounter (HOSPITAL_COMMUNITY)
Admission: EM | Disposition: A | Discharge status: Home / Self Care | Payer: Self-pay | Source: Home / Self Care | Attending: Internal Medicine

## 2012-11-26 ENCOUNTER — Encounter (HOSPITAL_COMMUNITY): Payer: Self-pay | Admitting: Certified Registered"

## 2012-11-26 DIAGNOSIS — M9080 Osteopathy in diseases classified elsewhere, unspecified site: Secondary | ICD-10-CM | POA: Diagnosis not present

## 2012-11-26 DIAGNOSIS — I739 Peripheral vascular disease, unspecified: Secondary | ICD-10-CM

## 2012-11-26 DIAGNOSIS — M869 Osteomyelitis, unspecified: Secondary | ICD-10-CM | POA: Diagnosis not present

## 2012-11-26 DIAGNOSIS — E1169 Type 2 diabetes mellitus with other specified complication: Secondary | ICD-10-CM | POA: Diagnosis not present

## 2012-11-26 DIAGNOSIS — I7092 Chronic total occlusion of artery of the extremities: Secondary | ICD-10-CM | POA: Diagnosis not present

## 2012-11-26 DIAGNOSIS — F17200 Nicotine dependence, unspecified, uncomplicated: Secondary | ICD-10-CM

## 2012-11-26 HISTORY — PX: AMPUTATION: SHX166

## 2012-11-26 LAB — BASIC METABOLIC PANEL
BUN: 14 mg/dL (ref 6–23)
CO2: 27 mEq/L (ref 19–32)
Calcium: 9.2 mg/dL (ref 8.4–10.5)
Chloride: 99 mEq/L (ref 96–112)
Creatinine, Ser: 0.99 mg/dL (ref 0.50–1.35)
GFR calc Af Amer: >90 mL/min (ref >90)
GFR calc non Af Amer: 88 mL/min — ABNORMAL LOW (ref >90)
Glucose, Bld: 173 mg/dL — ABNORMAL HIGH (ref 70–99)
Potassium: 3.7 mEq/L (ref 3.5–5.1)
Sodium: 137 mEq/L (ref 135–145)

## 2012-11-26 LAB — CBC
HCT: 27.1 % — ABNORMAL LOW (ref 39.0–52.0)
HCT: 29.3 % — ABNORMAL LOW (ref 39.0–52.0)
Hemoglobin: 9.7 g/dL — ABNORMAL LOW (ref 13.0–17.0)
Hemoglobin: 10.5 g/dL — ABNORMAL LOW (ref 13.0–17.0)
MCH: 29.2 pg (ref 26.0–34.0)
MCH: 29.6 pg (ref 26.0–34.0)
MCHC: 35.8 g/dL (ref 30.0–36.0)
MCHC: 35.8 g/dL (ref 30.0–36.0)
MCV: 81.6 fL (ref 78.0–100.0)
MCV: 82.5 fL (ref 78.0–100.0)
Platelets: 227 10*3/uL (ref 150–400)
Platelets: 247 10*3/uL (ref 150–400)
RBC: 3.32 MIL/uL — ABNORMAL LOW (ref 4.22–5.81)
RBC: 3.55 MIL/uL — ABNORMAL LOW (ref 4.22–5.81)
RDW: 12.8 % (ref 11.5–15.5)
RDW: 12.9 % (ref 11.5–15.5)
WBC: 6.5 10*3/uL (ref 4.0–10.5)
WBC: 5.8 10*3/uL (ref 4.0–10.5)

## 2012-11-26 LAB — CREATININE, SERUM
Creatinine, Ser: 1 mg/dL (ref 0.50–1.35)
GFR calc Af Amer: >90 mL/min (ref >90)
GFR calc non Af Amer: 81 mL/min — ABNORMAL LOW (ref >90)

## 2012-11-26 LAB — GLUCOSE, CAPILLARY
Glucose-Capillary: 107 mg/dL — ABNORMAL HIGH (ref 70–99)
Glucose-Capillary: 113 mg/dL — ABNORMAL HIGH (ref 70–99)
Glucose-Capillary: 147 mg/dL — ABNORMAL HIGH (ref 70–99)
Glucose-Capillary: 153 mg/dL — ABNORMAL HIGH (ref 70–99)
Glucose-Capillary: 181 mg/dL — ABNORMAL HIGH (ref 70–99)
Glucose-Capillary: 202 mg/dL — ABNORMAL HIGH (ref 70–99)
Glucose-Capillary: 240 mg/dL — ABNORMAL HIGH (ref 70–99)
Glucose-Capillary: 277 mg/dL — ABNORMAL HIGH (ref 70–99)

## 2012-11-26 LAB — SURGICAL PCR SCREEN
MRSA, PCR: NEGATIVE
Staphylococcus aureus: NEGATIVE

## 2012-11-26 SURGERY — AMPUTATION, FOOT, RAY
Anesthesia: General | Site: Foot | Laterality: Left | Wound class: Dirty or Infected

## 2012-11-26 MED ORDER — ACETAMINOPHEN 10 MG/ML IV SOLN
1000.0000 mg | Freq: Once | INTRAVENOUS | Status: AC | PRN
  Filled 2012-11-26: qty 100

## 2012-11-26 MED ORDER — FENTANYL CITRATE 0.05 MG/ML IJ SOLN
INTRAMUSCULAR | Status: DC | PRN
  Administered 2012-11-26: 100 ug via INTRAVENOUS
  Administered 2012-11-26: 50 ug via INTRAVENOUS

## 2012-11-26 MED ORDER — HYDROMORPHONE HCL PF 1 MG/ML IJ SOLN: 0.2500 mg | INTRAMUSCULAR | Status: DC | PRN

## 2012-11-26 MED ORDER — SODIUM CHLORIDE 0.9 % IV SOLN
INTRAVENOUS | Status: DC
  Administered 2012-11-26: 18:00:00 via INTRAVENOUS

## 2012-11-26 MED ORDER — ONDANSETRON HCL 4 MG/2ML IJ SOLN: 4.0000 mg | Freq: Once | INTRAMUSCULAR | Status: AC | PRN

## 2012-11-26 MED ORDER — GLYCOPYRROLATE 0.2 MG/ML IJ SOLN
INTRAMUSCULAR | Status: DC | PRN
  Administered 2012-11-26 (×2): 0.2 mg via INTRAVENOUS

## 2012-11-26 MED ORDER — HYDROMORPHONE HCL 2 MG PO TABS
2.0000 mg | ORAL_TABLET | ORAL | Status: DC | PRN
  Administered 2012-11-26 – 2012-11-27 (×5): 4 mg via ORAL
  Filled 2012-11-26 (×5): qty 2

## 2012-11-26 MED ORDER — LACTATED RINGERS IV SOLN
INTRAVENOUS | Status: DC | PRN
  Administered 2012-11-26: 13:00:00 via INTRAVENOUS

## 2012-11-26 MED ORDER — BACITRACIN ZINC 500 UNIT/GM EX OINT
TOPICAL_OINTMENT | CUTANEOUS | Status: DC | PRN
  Administered 2012-11-26: 1 via TOPICAL

## 2012-11-26 MED ORDER — ONDANSETRON HCL 4 MG/2ML IJ SOLN
INTRAMUSCULAR | Status: DC | PRN
  Administered 2012-11-26: 4 mg via INTRAVENOUS

## 2012-11-26 MED ORDER — LIDOCAINE HCL (CARDIAC) 20 MG/ML IV SOLN
INTRAVENOUS | Status: DC | PRN
  Administered 2012-11-26: 20 mg via INTRAVENOUS

## 2012-11-26 MED ORDER — HYDROMORPHONE HCL PF 1 MG/ML IJ SOLN
1.0000 mg | Freq: Once | INTRAMUSCULAR | Status: AC
  Administered 2012-11-26: 1 mg via INTRAVENOUS
  Filled 2012-11-26: qty 1

## 2012-11-26 MED ORDER — ENOXAPARIN SODIUM 40 MG/0.4ML ~~LOC~~ SOLN
40.0000 mg | SUBCUTANEOUS | Status: DC
  Administered 2012-11-26: 40 mg via SUBCUTANEOUS
  Filled 2012-11-26 (×2): qty 0.4

## 2012-11-26 MED ORDER — 0.9 % SODIUM CHLORIDE (POUR BTL) OPTIME
TOPICAL | Status: DC | PRN
  Administered 2012-11-26: 1000 mL

## 2012-11-26 MED ORDER — ALUM & MAG HYDROXIDE-SIMETH 200-200-20 MG/5ML PO SUSP
15.0000 mL | Freq: Once | ORAL | Status: AC
  Administered 2012-11-26: 15 mL via ORAL
  Filled 2012-11-26: qty 30

## 2012-11-26 MED ORDER — EPHEDRINE SULFATE 50 MG/ML IJ SOLN
INTRAMUSCULAR | Status: DC | PRN
  Administered 2012-11-26: 10 mg via INTRAVENOUS

## 2012-11-26 MED ORDER — PROPOFOL 10 MG/ML IV BOLUS
INTRAVENOUS | Status: DC | PRN
  Administered 2012-11-26: 190 mg via INTRAVENOUS

## 2012-11-26 SURGICAL SUPPLY — 44 items
BANDAGE CONFORM 3  STR LF (GAUZE/BANDAGES/DRESSINGS) ×1 IMPLANT
BLADE LONG MED 31X9 (MISCELLANEOUS) ×2 IMPLANT
BNDG CMPR 9X4 STRL LF SNTH (GAUZE/BANDAGES/DRESSINGS) ×1
BNDG COHESIVE 4X5 TAN STRL (GAUZE/BANDAGES/DRESSINGS) ×2 IMPLANT
BNDG COHESIVE 6X5 TAN STRL LF (GAUZE/BANDAGES/DRESSINGS) ×1 IMPLANT
BNDG ESMARK 4X9 LF (GAUZE/BANDAGES/DRESSINGS) ×2 IMPLANT
CHLORAPREP W/TINT 26ML (MISCELLANEOUS) ×2 IMPLANT
CLOTH BEACON ORANGE TIMEOUT ST (SAFETY) ×2 IMPLANT
CONT SPEC STER OR (MISCELLANEOUS) ×1 IMPLANT
CUFF TOURNIQUET SINGLE 34IN LL (TOURNIQUET CUFF) IMPLANT
CUFF TOURNIQUET SINGLE 44IN (TOURNIQUET CUFF) IMPLANT
DRAPE U-SHAPE 47X51 STRL (DRAPES) ×3 IMPLANT
DRSG ADAPTIC 3X8 NADH LF (GAUZE/BANDAGES/DRESSINGS) ×1 IMPLANT
ELECT REM PT RETURN 9FT ADLT (ELECTROSURGICAL) ×2
ELECTRODE REM PT RTRN 9FT ADLT (ELECTROSURGICAL) ×1 IMPLANT
GLOVE BIO SURGEON STRL SZ 6.5 (GLOVE) ×1 IMPLANT
GLOVE BIO SURGEON STRL SZ8 (GLOVE) ×3 IMPLANT
GLOVE BIO SURGEON STRL SZ8.5 (GLOVE) ×2 IMPLANT
GLOVE BIOGEL PI IND STRL 8 (GLOVE) ×1 IMPLANT
GLOVE BIOGEL PI INDICATOR 8 (GLOVE) ×1
GLOVE SURG SS PI 8.5 STRL IVOR (GLOVE) ×2
GLOVE SURG SS PI 8.5 STRL STRW (GLOVE) IMPLANT
GOWN PREVENTION PLUS XLARGE (GOWN DISPOSABLE) ×2 IMPLANT
GOWN STRL NON-REIN LRG LVL3 (GOWN DISPOSABLE) ×1 IMPLANT
GOWN STRL REIN 2XL XLG LVL4 (GOWN DISPOSABLE) ×2 IMPLANT
KIT BASIN OR (CUSTOM PROCEDURE TRAY) ×2 IMPLANT
KIT ROOM TURNOVER OR (KITS) ×2 IMPLANT
MANIFOLD NEPTUNE II (INSTRUMENTS) ×2 IMPLANT
NS IRRIG 1000ML POUR BTL (IV SOLUTION) ×2 IMPLANT
PACK ORTHO EXTREMITY (CUSTOM PROCEDURE TRAY) ×2 IMPLANT
PAD ARMBOARD 7.5X6 YLW CONV (MISCELLANEOUS) ×3 IMPLANT
PAD CAST 4YDX4 CTTN HI CHSV (CAST SUPPLIES) ×1 IMPLANT
PADDING CAST COTTON 4X4 STRL (CAST SUPPLIES) ×2
SPONGE GAUZE 4X4 12PLY (GAUZE/BANDAGES/DRESSINGS) ×2 IMPLANT
SPONGE LAP 18X18 X RAY DECT (DISPOSABLE) ×2 IMPLANT
STAPLER VISISTAT 35W (STAPLE) IMPLANT
STOCKINETTE IMPERVIOUS LG (DRAPES) IMPLANT
SUCTION FRAZIER TIP 10 FR DISP (SUCTIONS) ×1 IMPLANT
SUT ETHILON 2 0 PSLX (SUTURE) ×1 IMPLANT
TOWEL OR 17X24 6PK STRL BLUE (TOWEL DISPOSABLE) ×2 IMPLANT
TOWEL OR 17X26 10 PK STRL BLUE (TOWEL DISPOSABLE) ×2 IMPLANT
TUBE CONNECTING 12X1/4 (SUCTIONS) ×2 IMPLANT
UNDERPAD 30X30 INCONTINENT (UNDERPADS AND DIAPERS) ×2 IMPLANT
WATER STERILE IRR 1000ML POUR (IV SOLUTION) ×1 IMPLANT

## 2012-11-26 NOTE — Op Note (Signed)
[name], [name] NO.:  [id number]  MEDICAL RECORD NO.:  [id number]  LOCATION:  5N01C                        FACILITY:  [facility]  PHYSICIAN:  Wylene Simmer, MD        DATE OF BIRTH:  [birth year]  DATE OF PROCEDURE:  11/26/2012 DATE OF DISCHARGE:                              OPERATIVE REPORT   PREOPERATIVE DIAGNOSIS:  Left fourth toe osteomyelitis.  POSTOPERATIVE DIAGNOSIS:  Left fourth toe osteomyelitis.  PROCEDURE:  Left fourth toe amputation through the metatarsophalangeal joint.  SURGEON:  Wylene Simmer, MD  ANESTHESIA:  General.  ESTIMATED BLOOD LOSS:  Minimal.  TOURNIQUET TIME:  Approximately 10 minutes with an ankle Esmarch.  COMPLICATIONS:  None apparent.  Specimen:  Deep tissue to micro for aerobic and anaerobic culture; toe to pathology  DISPOSITION:  Extubated, awake, and stable to recovery.  INDICATIONS FOR PROCEDURE:  The patient is a 57 year old male with past medical history significant for diabetes complicated by amputation of the right fourth and fifth toes.  He presents with an ulcer at the left fourth toe with exposed bone.  The ulcer has enlarged over the last week, despite treatment with wound care and oral antibiotics.  He was also noted to not have a fracture of the proximal phalanx.  He presents now for amputation of the left fourth toe.  He understands the risks and benefits, the alternative treatment options and elects surgical treatment.  He specifically understands risks of bleeding, infection, nerve damage, blood clots, need for additional surgery, revision, amputation, and death.  PROCEDURE IN DETAIL:  After preoperative consent was obtained, the correct operative site was identified.  The patient was brought to the operating room and placed supine on the operating table.  General anesthesia was induced.  The patient was already on therapeutic IV antibiotics.  A surgical time-out was taken.  The left lower extremity was  prepped and draped in standard sterile fashion.  The foot was exsanguinated with a 4-inch Esmarch and it was wrapped around the ankle as a tourniquet.  The fourth toe was identified and noted again to have a large dural ulcer laterally with exposed bone throughout the base of the ulcer.  A racquet style incision was marked around the base of the toe preserving the medial and dorsal flaps over the proximal phalanx. Sharp dissection was carried down through the marked incision to the level of the bone.  Subperiosteal dissection was carried along the base of the proximal phalanx and the toe was disarticulated through the MTP joint.  The deep tissue specimen was obtained from the toe itself at the site of the wound and sent to Microbiology for aerobic and anaerobic culture.  The remainder the toe was sent as a specimen to Pathology. The neurovascular bundles were cauterized.  Wound was irrigated copiously.  Tourniquet was released.  Hemostasis was achieved. Horizontal mattress sutures of 2-0 nylon were used to close the incision.  Sterile dressings were applied, followed by compression wrap. The patient was then awakened from anesthesia and transported to the recovery room in stable condition.   FOLLOWUP PLAN:  The patient will be weightbearing as tolerated on his left foot in  a hard-sole shoe.  He will keep his foot elevated and follow up with me in 2 weeks for suture removal.     Wylene Simmer, MD     JH/MEDQ  D:  11/26/2012  T:  11/26/2012  Job:  [id number]

## 2012-11-26 NOTE — Anesthesia Procedure Notes (Signed)
Procedure Name: LMA Insertion Date/Time: 11/26/2012 1:38 PM Performed by: Manuela Schwartz B Pre-anesthesia Checklist: Patient identified, Emergency Drugs available, Suction available, Patient being monitored and Timeout performed Patient Re-evaluated:Patient Re-evaluated prior to inductionOxygen Delivery Method: Circle system utilized Preoxygenation: Pre-oxygenation with 100% oxygen Intubation Type: IV induction LMA: LMA inserted LMA Size: 5.0 Number of attempts: 1 Placement Confirmation: positive ETCO2 and breath sounds checked- equal and bilateral Tube secured with: Tape Dental Injury: Teeth and Oropharynx as per pre-operative assessment

## 2012-11-26 NOTE — Brief Op Note (Signed)
11/23/2012 - 11/26/2012  2:28 PM  PATIENT:  [name]  57 y.o. male  PRE-OPERATIVE DIAGNOSIS:  Osteomyelitis Left 4th Toe  POST-OPERATIVE DIAGNOSIS:  Osteomyelitis Left 4th Toe  Procedure(s): Left 4th toe amputation through the MTP joint.  SURGEON:  Wylene Simmer, MD  ASSISTANT: n/a  ANESTHESIA:   General  EBL:  minimal   TOURNIQUET:  approx 10 min with an ankle esmarch  COMPLICATIONS:  None apparent  DISPOSITION:  Extubated, awake and stable to recovery.  DICTATION ID:  [id number]

## 2012-11-26 NOTE — Interval H&P Note (Signed)
History and Physical Interval Note:  11/26/2012 1:18 PM  Macdonald L Iddings  has presented today for surgery, with the diagnosis of Osteomyelitis Left 4th Toe  The various methods of treatment have been discussed with the patient and family. After consideration of risks, benefits and other options for treatment, the patient has consented to  Left 4th toe amputation as a surgical intervention .  The patient's history has been reviewed, patient examined, no change in status, stable for surgery.  I have reviewed the patient's chart and labs.  Questions were answered to the patient's satisfaction.     Wylene Simmer

## 2012-11-26 NOTE — Progress Notes (Signed)
Spoke with Forrest Moron, NP on call for hospitalist team overnight. Order to hold subcutaneous heparin and to hold insulin prior to surgery this afternoon on 3/19.  Will communicate to oncoming nursing staff.

## 2012-11-26 NOTE — Progress Notes (Signed)
Orthopedic Tech Progress Note Patient Details:  [name] 14-May-1956 [id number]  Ortho Devices Type of Ortho Device: Postop shoe/boot Ortho Device/Splint Location: (L) LE Ortho Device/Splint Interventions: Application   Braulio Bosch 11/26/2012, 6:21 PM

## 2012-11-26 NOTE — Progress Notes (Signed)
Patient ID: [name]  male  [id number]    DOB: 1956-04-10    DOA: 11/23/2012  PCP: [name], MD  Assessment/Plan: Principal Problem:   Foot ulcer, left, cellulitis, fracture in the toe - Wound culture growing few enterococcus, gram-negative rods, will follow final sensitivities - MRI left foot showed oblique midshaft fracture of proximal phalanx of the fourth toe surrounding subcutaneous edema, superimposed osteomyelitis cannot be ruled out - to OR today, ortho following - cont vancomycin and ciprofloxacin  Active Problems:   PVD (peripheral vascular disease): Continue Plavix    HTN (hypertension): Stable, borderline    Tobacco abuse: on nicotine patch    GERD (gastroesophageal reflux disease): Place on PPI    Diabetes mellitus - Placed on sliding scale insulin and Lantus - Currently n.p.o. For surgery today  DVT Prophylaxis: Heparin subcutaneous, will restart after surgery  Code Status:  Disposition:    Subjective: Awaiting surgery today, left foot pain is controlled, no acute issues overnight  Objective: Weight change:   Intake/Output Summary (Last 24 hours) at 11/26/12 1218 Last data filed at 11/26/12 0735  Gross per 24 hour  Intake    480 ml  Output   1750 ml  Net  -1270 ml   Blood pressure 122/60, pulse 56, temperature 98.6 F (37 C), temperature source Oral, resp. rate 18, height 6\' 2"  (1.88 m), weight 111.948 kg (246 lb 12.8 oz), SpO2 100.00%.  Physical Exam: General: A xOx 3, NAD CVS: S1-S2 clear, no MRG Chest: CTAB Abdomen: soft NT, ND, NBS  Extremities: no c/c/e    Lab Results: Basic Metabolic Panel:  Recent Labs Lab 11/24/12 0610 11/26/12 0508  NA 138 137  K 3.2* 3.7  CL 100 99  CO2 29 27  GLUCOSE 80 173*  BUN 19 14  CREATININE 1.03 0.99  CALCIUM 9.1 9.2   Liver Function Tests:  Recent Labs Lab 11/24/12 0610  AST 9  ALT 9  ALKPHOS 122*  BILITOT 0.3  PROT 6.4  ALBUMIN 3.3*   CBC:  Recent Labs Lab  11/25/12 0625 11/26/12 0508  WBC 6.9 6.5  NEUTROABS 4.5  --   HGB 9.7* 9.7*  HCT 27.3* 27.1*  MCV 82.2 81.6  PLT 219 227   CBG:  Recent Labs Lab 11/25/12 1607 11/25/12 2049 11/26/12 0010 11/26/12 0441 11/26/12 0923  GLUCAP 154* 162* 202* 181* 147*     Micro Results: Recent Results (from the past 240 hour(s))  WOUND CULTURE     Status: None   Collection Time    11/24/12 12:23 AM      Result Value Range Status   Specimen Description WOUND TOE   Final   Special Requests NONE   Final   Gram Stain     Final   Value: FEW WBC PRESENT, PREDOMINANTLY PMN     NO SQUAMOUS EPITHELIAL CELLS SEEN     FEW GRAM POSITIVE COCCI IN PAIRS   Culture     Final   Value: FEW ENTEROCOCCUS SPECIES     MODERATE GRAM NEGATIVE RODS   Report Status PENDING   Incomplete  SURGICAL PCR SCREEN     Status: None   Collection Time    11/25/12 11:58 PM      Result Value Range Status   MRSA, PCR NEGATIVE  NEGATIVE Final   Staphylococcus aureus NEGATIVE  NEGATIVE Final   Comment:            The Xpert SA Assay (FDA  approved for NASAL specimens     in patients over 38 years of age),     is one component of     a comprehensive surveillance     program.  Test performance has     been validated by [employer] for patients greater     than or equal to 65 year old.     It is not intended     to diagnose infection nor to     guide or monitor treatment.    Studies/Results: Mr Foot Left Wo Contrast  11/24/2012  *RADIOLOGY REPORT*  Clinical Data: Peripheral vascular disease.  Diabetes.  History of osteomyelitis with prior right toe amputations.  Progressive left foot pain over the past month with worsening erythema and pain. Drainage of purulent material.  MRI OF THE LEFT FOREFOOT WITHOUT CONTRAST  Technique:  Multiplanar, multisequence MR imaging was performed. No intravenous contrast was administered.  Comparison: 11/23/2012  Findings: Abnormal osseous edema in the proximal phalanx of the fourth  toe observed with bony discontinuity in the shaft on image 19 of series 8, compatible with fracture.  Superimposed infection is not excluded.  There is also abnormal edema throughout the middle phalanx of the fourth toe, but with only equivocal low-level edema in the distal phalanx.  There is surrounding subcutaneous edema in the fourth toe. Remaining phalanges intact.  Low-level edema likely related to arthropathy noted along the Lisfranc joint and cuneiforms, particularly involving the middle cuneiform.  Low-level edema tracts along the plantar musculature of the foot, and there is abnormal subcutaneous edema in the dorsal soft tissues of the foot especially laterally.  Mild peroneus longus tenosynovitis is present and there is mild flexor hallucis longus tenosynovitis the level of the knot of Henry.  IMPRESSION:  1.  Oblique midshaft fracture of the proximal phalanx of the fourth toe, with edema in the proximal phalanx and middle phalanx with surrounding subcutaneous edema. On conventional radiography, the fracture was noted to extend into the base and distal head of the proximal phalanx.  Although a fracture is present, superimposed osteomyelitis involving the proximal phalanx of the middle phalanx is not readily excluded. 2.  Abnormal edema in the dorsal subcutaneous soft tissues of the foot, in the fourth toe, and in the plantar musculature of the foot, potentially representing cellulitis and myositis. 3.  Degenerative findings along the Lisfranc joint.   Original Report Authenticated By: Van Clines, M.D.    Dg Foot Complete Left  11/23/2012  *RADIOLOGY REPORT*  Clinical Data: Left foot pain.  Swelling, particularly involving the fourth toe.  LEFT FOOT - COMPLETE 3+ VIEW  Comparison: None.  Findings: Fractures involving both the base and the head of the proximal phalanx of the fourth toe, both fractures extending to the articular surfaces.  No other fractures.  Joint space narrowing involving IP  joints of multiple toes.  Well-preserved bone mineral density.  Well-preserved joint spaces otherwise.  Small plantar calcaneal spur.  Soft tissue swelling involving the dorsum of the foot.  IMPRESSION: Fractures involving both the base and the head of the proximal phalanx of the fourth toe, with both fractures extending to the articular surfaces.   Original Report Authenticated By: Evangeline Dakin, M.D.     Medications: Scheduled Meds: . ALPRAZolam  0.25 mg Oral TID  . amLODipine  5 mg Oral Daily  . atorvastatin  40 mg Oral Daily  . B-complex with vitamin C  1 tablet Oral Daily  . carvedilol  25 mg Oral BID WC  . ciprofloxacin  400 mg Intravenous Q12H  . clopidogrel  75 mg Oral Q breakfast  . ezetimibe  10 mg Oral q morning - 10a  . fentaNYL  50 mcg Transdermal Q72H  . FLUoxetine  20 mg Oral Daily  . furosemide  80 mg Oral Daily  . insulin aspart  0-20 Units Subcutaneous Q4H  . insulin glargine  50 Units Subcutaneous Daily  . olmesartan  40 mg Oral Daily  . omega-3 acid ethyl esters  2 g Oral BID  . pantoprazole  40 mg Oral Daily  . pregabalin  75 mg Oral TID  . rOPINIRole  2 mg Oral QHS  . sodium chloride  3 mL Intravenous Q12H  . tamsulosin  0.4 mg Oral Daily  . vancomycin  1,250 mg Intravenous Q12H      LOS: 3 days   Melannie Metzner M.D. [facility] 11/26/2012, 12:18 PM Pager: [phone]  If 7PM-7AM, please contact night-coverage [email or web address] Password [email or web address]

## 2012-11-26 NOTE — Transfer of Care (Signed)
Immediate Anesthesia Transfer of Care Note  Patient: [name]  Procedure(s) Performed: Procedure(s) with comments: AMPUTATION RAY (Left) - fourth ray amputation  Patient Location: PACU  Anesthesia Type:General  Level of Consciousness: responds to stimulation  Airway & Oxygen Therapy: Patient Spontanous Breathing and Patient connected to face mask oxygen  Post-op Assessment: Report given to PACU RN and Post -op Vital signs reviewed and stable  Post vital signs: Reviewed and stable  Complications: No apparent anesthesia complications

## 2012-11-26 NOTE — Anesthesia Preprocedure Evaluation (Signed)
Anesthesia Evaluation  Patient identified by MRN, date of birth, ID band Patient awake    Reviewed: Allergy & Precautions, H&P , NPO status , Patient's Chart, lab work & pertinent test results  Airway Mallampati: II      Dental  (+) Edentulous Upper and Edentulous Lower   Pulmonary  breath sounds clear to auscultation        Cardiovascular Rhythm:Regular Rate:Normal     Neuro/Psych    GI/Hepatic   Endo/Other    Renal/GU      Musculoskeletal   Abdominal   Peds  Hematology   Anesthesia Other Findings   Reproductive/Obstetrics                           Anesthesia Physical Anesthesia Plan  ASA: III  Anesthesia Plan: General   Post-op Pain Management:    Induction: Intravenous  Airway Management Planned: LMA  Additional Equipment:   Intra-op Plan:   Post-operative Plan: Extubation in OR  Informed Consent: I have reviewed the patients History and Physical, chart, labs and discussed the procedure including the risks, benefits and alternatives for the proposed anesthesia with the patient or authorized representative who has indicated his/her understanding and acceptance.     Plan Discussed with: CRNA and Surgeon  Anesthesia Plan Comments: (Gangrene L 4th toe Longstanding Type 2 DM glucose 147 Htn L. Diabetic neuropathy PVD Barrett's esophagus  Plan GA with LMA  Roberts Gaudy, MD)        Anesthesia Quick Evaluation

## 2012-11-26 NOTE — Progress Notes (Signed)
Patient c/o 9/10 CP.  Pt. Described it as pressure.  He stated that it could be indigestion.  Dr. Ola Spurr notified. Orders received for stat EKG and maloxx.

## 2012-11-26 NOTE — Anesthesia Postprocedure Evaluation (Signed)
  Anesthesia Post-op Note  Patient: [name]  Procedure(s) Performed: Procedure(s) with comments: AMPUTATION RAY (Left) - fourth ray amputation  Patient Location: PACU  Anesthesia Type:General  Level of Consciousness: awake  Airway and Oxygen Therapy: Patient Spontanous Breathing and Patient connected to face mask oxygen  Post-op Pain: none  Post-op Assessment: Post-op Vital signs reviewed, Patient's Cardiovascular Status Stable, Respiratory Function Stable, Patent Airway and No signs of Nausea or vomiting  Post-op Vital Signs: Reviewed and stable  Complications: No apparent anesthesia complications

## 2012-11-27 ENCOUNTER — Encounter (HOSPITAL_COMMUNITY): Payer: Self-pay | Admitting: Orthopedic Surgery

## 2012-11-27 DIAGNOSIS — M869 Osteomyelitis, unspecified: Secondary | ICD-10-CM

## 2012-11-27 DIAGNOSIS — I7092 Chronic total occlusion of artery of the extremities: Secondary | ICD-10-CM

## 2012-11-27 DIAGNOSIS — Z48812 Encounter for surgical aftercare following surgery on the circulatory system: Secondary | ICD-10-CM

## 2012-11-27 LAB — CBC
HCT: 24.4 % — ABNORMAL LOW (ref 39.0–52.0)
Hemoglobin: 9 g/dL — ABNORMAL LOW (ref 13.0–17.0)
MCH: 29.6 pg (ref 26.0–34.0)
MCHC: 36.9 g/dL — ABNORMAL HIGH (ref 30.0–36.0)
MCV: 80.3 fL (ref 78.0–100.0)
Platelets: 226 10*3/uL (ref 150–400)
RBC: 3.04 MIL/uL — ABNORMAL LOW (ref 4.22–5.81)
RDW: 12.9 % (ref 11.5–15.5)
WBC: 7.2 10*3/uL (ref 4.0–10.5)

## 2012-11-27 LAB — WOUND CULTURE

## 2012-11-27 LAB — BASIC METABOLIC PANEL
BUN: 13 mg/dL (ref 6–23)
CO2: 31 mEq/L (ref 19–32)
Calcium: 8.6 mg/dL (ref 8.4–10.5)
Chloride: 98 mEq/L (ref 96–112)
Creatinine, Ser: 1.06 mg/dL (ref 0.50–1.35)
GFR calc Af Amer: 88 mL/min — ABNORMAL LOW (ref >90)
GFR calc non Af Amer: 76 mL/min — ABNORMAL LOW (ref >90)
Glucose, Bld: 149 mg/dL — ABNORMAL HIGH (ref 70–99)
Potassium: 3.6 mEq/L (ref 3.5–5.1)
Sodium: 136 mEq/L (ref 135–145)

## 2012-11-27 LAB — GLUCOSE, CAPILLARY
Glucose-Capillary: 168 mg/dL — ABNORMAL HIGH (ref 70–99)
Glucose-Capillary: 183 mg/dL — ABNORMAL HIGH (ref 70–99)
Glucose-Capillary: 241 mg/dL — ABNORMAL HIGH (ref 70–99)

## 2012-11-27 MED ORDER — AMOXICILLIN-POT CLAVULANATE 875-125 MG PO TABS: 1.0000 | ORAL_TABLET | Freq: Two times a day (BID) | ORAL | Status: DC

## 2012-11-27 MED ORDER — HYDROMORPHONE HCL 2 MG PO TABS: 2.0000 mg | ORAL_TABLET | ORAL | Status: DC | PRN

## 2012-11-27 MED ORDER — OXYCODONE HCL 5 MG PO TABS: 5.0000 mg | ORAL_TABLET | ORAL | Status: DC | PRN

## 2012-11-27 MED ORDER — IBUPROFEN 400 MG PO TABS
400.0000 mg | ORAL_TABLET | Freq: Four times a day (QID) | ORAL | Status: DC | PRN
  Filled 2012-11-27 (×2): qty 1

## 2012-11-27 MED ORDER — CIPROFLOXACIN HCL 500 MG PO TABS
500.0000 mg | ORAL_TABLET | Freq: Two times a day (BID) | ORAL | Status: DC
  Administered 2012-11-27: 500 mg via ORAL
  Filled 2012-11-27 (×4): qty 1

## 2012-11-27 MED ORDER — IBUPROFEN 400 MG PO TABS: 400.0000 mg | ORAL_TABLET | Freq: Four times a day (QID) | ORAL | Status: DC | PRN

## 2012-11-27 MED ORDER — OXYCODONE-ACETAMINOPHEN 5-325 MG PO TABS: 1.0000 | ORAL_TABLET | ORAL | Status: DC | PRN

## 2012-11-27 NOTE — Evaluation (Signed)
Physical Therapy Evaluation Patient Details Name: [name] MRN: [id number] DOB: 1955-09-30 Today's Date: 11/27/2012 Time: [id number] PT Time Calculation (min): 26 min  PT Assessment / Plan / Recommendation Clinical Impression  Pt s/p Lt foot infection resulting in Lt 4th toe amputation. Pt familiar with use of post-op shoe due to surgery on Rt foot 1 year ago. Donned shoe independently. Pt preferred ambulating without a device and was steady, however then explained his h/o falls due to Rt knee giving out (h/o TKR). Educated on use of RW to prevent further injury to Lt foot and pt agreeable to use his RW at home.  No further needs identified.     PT Assessment  Patent does not need any further PT services    Follow Up Recommendations  No PT follow up    Does the patient have the potential to tolerate intense rehabilitation      Barriers to Discharge        Equipment Recommendations  None recommended by PT    Recommendations for Other Services     Frequency      Precautions / Restrictions Precautions Precautions: Fall Required Braces or Orthoses: Other Brace/Splint Other Brace/Splint: post-op shoe Lt foot Restrictions Weight Bearing Restrictions: No   Pertinent Vitals/Pain Denied pain      Mobility  Bed Mobility Bed Mobility: Supine to Sit;Sitting - Scoot to Edge of Bed Supine to Sit: 7: Independent Sitting - Scoot to [employer] of Bed: 7: Independent Transfers Transfers: Sit to Stand;Stand to Sit Sit to Stand: 7: Independent Stand to Sit: 7: Independent Details for Transfer Assistance: x2 Ambulation/Gait Ambulation/Gait Assistance: 5: Supervision Ambulation Distance (Feet): 100 Feet Assistive device: None Ambulation/Gait Assistance Details: Pt steady with gait; while ambulating he reported h/o falls and Rt knee buckling. Encouraged use of RW on d/c to minimize risk and minimize pressure/trauma to Lt foot while healing.  Gait Pattern: Step-through pattern;Decreased  step length - right;Wide base of support        Visit Information  Last PT Received On: 11/27/12 Assistance Needed: +1    Subjective Data  Subjective: Reports his Rt knee gives out sometimes Patient Stated Goal: return home today   Prior Functioning  Home Living Lives With: Spouse;Family Available Help at Discharge: Family Type of Home: House Home Access: Ramped entrance Home Layout: One level Bathroom Shower/Tub: Multimedia programmer: Handicapped height Bathroom Accessibility: Yes How Accessible: Accessible via [place]: Walker - rolling;Straight cane;Crutches Prior Function Level of Independence: Independent with assistive device(s) Able to Take Stairs?: Yes Driving: Yes Comments: occasionally uses cane    Cognition  Cognition Overall Cognitive Status: Appears within functional limits for tasks assessed/performed    Extremity/Trunk Assessment Right Lower Extremity Assessment RLE ROM/Strength/Tone: Deficits RLE ROM/Strength/Tone Deficits: reports weakness in Rt knee s/p TKR and occasionally gives out with pt falling RLE Sensation: History of peripheral neuropathy Left Lower Extremity Assessment LLE ROM/Strength/Tone: WFL for tasks assessed LLE Sensation: History of peripheral neuropathy   Balance Balance Balance Assessed: Yes Static Standing Balance Static Standing - Balance Support: No upper extremity supported Static Standing - Level of Assistance: 7: Independent High Level Balance High Level Balance Activites: Side stepping;Backward walking;Turns;Sudden stops;Head turns  End of Session PT - End of Session Equipment Utilized During Treatment: Other (comment) (Lt post-op shoe) Activity Tolerance: Patient tolerated treatment well Patient left: in bed;with call bell/phone within reach Nurse Communication: Mobility status  GP     Kaena Santori 11/27/2012, 11:43 AM  11/27/2012 Barry Brunner,  PT Pager: [phone]

## 2012-11-27 NOTE — Progress Notes (Signed)
Discharge instruction provided and client stated understand.  Prescription given to client.  Client understand to follow up with Dr. [name] and Dr. Wilson Singer in two week.  Understood teaching on keeping Left leg dry and clean at all time.

## 2012-11-27 NOTE — Discharge Summary (Signed)
Physician Discharge Summary  Patient ID: [name] MRN: [id number] DOB/AGE: 02/06/1956 57 y.o.  Admit date: 11/23/2012 Discharge date: 11/27/2012  Primary Care Physician:  Harold Bolt, MD  Discharge Diagnoses:    . PVD (peripheral vascular disease) . HTN (hypertension) . Tobacco abuse . GERD (gastroesophageal reflux disease) . Diabetes mellitus . Cellulitis . Foot ulcer, left/ left fourth toe osteomyelitis status post amputation on 11/26/2012  Consults: Orthopedics, Dr. [name]   Discharge Instructions: Weight bearing as tolerated on his left foot in a hard sole shoe Keep foot elevated Followup with Dr. [name] in 2 weeks for suture removal  Discharge Medications:   Medication List    STOP taking these medications       oxyCODONE-acetaminophen 5-500 MG per capsule  Commonly known as:  TYLOX      TAKE these medications       ALPRAZolam 0.5 MG tablet  Commonly known as:  XANAX  Take 0.25 mg by mouth 3 (three) times daily.     amoxicillin-clavulanate 875-125 MG per tablet  Commonly known as:  AUGMENTIN  Take 1 tablet by mouth 2 (two) times daily. X 2 weeks     atorvastatin 40 MG tablet  Commonly known as:  LIPITOR  Take 40 mg by mouth daily.     AZOR 5-40 MG per tablet  Generic drug:  amLODipine-olmesartan  Take 1 tablet by mouth daily.     B-complex with vitamin C tablet  Take 1 tablet by mouth daily.     carvedilol 25 MG tablet  Commonly known as:  COREG  Take 25 mg by mouth 2 (two) times daily with a meal.     clopidogrel 75 MG tablet  Commonly known as:  PLAVIX  1 tab daily     ezetimibe 10 MG tablet  Commonly known as:  ZETIA  Take 10 mg by mouth every morning.     fentaNYL 50 MCG/HR  Commonly known as:  DURAGESIC - dosed mcg/hr  Place 1 patch onto the skin every 3 (three) days.     FLUoxetine 20 MG capsule  Commonly known as:  PROZAC  Take 20 mg by mouth daily.     furosemide 80 MG tablet  Commonly known as:  LASIX  Take 80 mg  by mouth daily.     glyBURIDE-metformin 2.5-500 MG per tablet  Commonly known as:  GLUCOVANCE  Take 2 tablets by mouth 2 (two) times daily.     HYDROmorphone 2 MG tablet  Commonly known as:  DILAUDID  Take 1 tablet (2 mg total) by mouth every 4 (four) hours as needed for pain.     ibuprofen 400 MG tablet  Commonly known as:  ADVIL,MOTRIN  Take 1 tablet (400 mg total) by mouth every 6 (six) hours as needed for pain or fever.     insulin glargine 100 UNIT/ML injection  Commonly known as:  LANTUS  Inject 50 Units into the skin 2 (two) times daily.     insulin glulisine 100 UNIT/ML injection  Commonly known as:  APIDRA  Inject 10-20 Units into the skin 3 (three) times daily before meals. 10 breakfast 10 lunch and 20 supper     lansoprazole 30 MG capsule  Commonly known as:  PREVACID  Take 30 mg by mouth 2 (two) times daily.     omega-3 acid ethyl esters 1 G capsule  Commonly known as:  LOVAZA  Take 2 g by mouth 2 (two) times daily.     ondansetron 4  MG disintegrating tablet  Commonly known as:  ZOFRAN-ODT  Take 4 mg by mouth 4 (four) times daily as needed for nausea.     oxyCODONE 5 MG immediate release tablet  Commonly known as:  ROXICODONE  Take 1-2 tablets (5-10 mg total) by mouth every 4 (four) hours as needed for pain (for breakthrough pain).     POTASSIUM PO  Take 1 tablet by mouth daily.     pregabalin 75 MG capsule  Commonly known as:  LYRICA  Take 75 mg by mouth 3 (three) times daily.     promethazine 12.5 MG tablet  Commonly known as:  PHENERGAN  Take 1 tablet (12.5 mg total) by mouth every 6 (six) hours as needed for nausea.     rOPINIRole 2 MG tablet  Commonly known as:  REQUIP  Take 2 mg by mouth at bedtime.     tamsulosin 0.4 MG Caps  Commonly known as:  FLOMAX  Take 0.4 mg by mouth daily.         Brief H and P: For complete details please refer to admission H and P, but in brief This is a 57 y.o. year old male multiple medical problems  including peripheral vascular disease status post multiple peripheral vascular stents, chronic pain on multiple narcotics, type 2 diabetes, hypertension, history of osteomyelitis status post right fourth and fifth toe amputation 10/2011 presenting with questionable osteomyelitis of the left fourth toe. Patient reported having progressive left foot pain over the past month. Pain progressively got worse and he went to see his primary orthopedist ([place]). Per the patient, he was placed on doxycycline for soft tissue coverage with plan for followup on March 19. Patient states that since he began antibiotics, he had progressive redness and worsening of pain. Patient initially noticed pus drainage prior to his orthopedic visit. Patient states this has returned over the past 2-3 days. Patient denied any fevers or chills. Blood sugars poorly controlled at home. Patient does have baseline diabetic neuropathy and only feels pain intermittently.  In the ED, left foot plain film was obtained (MRI not available) showing left fourth and fifth toe fractures. Sedimentation rate of 68. White count of 8. Wound culture was obtained. Patient given one dose of IV vancomycin.   Hospital Course:   Foot ulcer, left, cellulitis, fracture in the toe/ osteomyelitis left fourth toe.  Wound culture the proteus and enterococcus.  MRI left foot showed oblique midshaft fracture of proximal phalanx of the fourth toe surrounding subcutaneous edema, superimposed osteomyelitis cannot be ruled out. Given the exposed bone at the diabetic ulcer patient did have osteomyelitis hence underwent amputation of the left fourth toe for definitive treatment on 11/26/2012. He received IV ciprofloxacin and vancomycin throughout the hospitalization. Based on the wound and tissue cultures, I discussed with infectious disease, Dr. [name], Augmentin for 2 weeks will be sufficient. He will followup with Dr. [name] in office, who will follow final  tissue cultures, dressing and suture removal.  Patient is on chronic narcotics for pain at home which is likely cause of his tolerance to narcotics. He was given prescription of oral dilaudid #30 tabs (no refills) and oxycodone for breakthrough pain #45 (no refills)   PT evaluation was done prior to the discharge and recommended no further need of physical therapy. PVD (peripheral vascular disease): Continue Plavix  HTN (hypertension): Stable, borderline  GERD (gastroesophageal reflux disease): Place on PPI  Diabetes mellitus: Continue outpatient management and follow PCP for better glycemic control.  Day of Discharge BP 126/54  Pulse 55  Temp(Src) 98.9 F (37.2 C) (Oral)  Resp 18  Ht 6\' 2"  (1.88 m)  Wt 111.857 kg (246 lb 9.6 oz)  BMI 31.65 kg/m2  SpO2 98%  Physical Exam: General: Alert and awake oriented x3 not in any acute distress. HEENT: anicteric sclera, pupils reactive to light and accommodation CVS: S1-S2 clear no murmur rubs or gallops Chest: clear to auscultation bilaterally, no wheezing rales or rhonchi Abdomen: soft nontender, nondistended, normal bowel sounds, no organomegaly Extremities: no cyanosis, clubbing or edema noted bilaterally Neuro: Cranial nerves II-XII intact, no focal neurological deficits   The results of significant diagnostics from this hospitalization (including imaging, microbiology, ancillary and laboratory) are listed below for reference.    LAB RESULTS: Basic Metabolic Panel:  Recent Labs Lab 11/26/12 0508 11/26/12 1747 11/27/12 0618  NA 137  --  136  K 3.7  --  3.6  CL 99  --  98  CO2 27  --  31  GLUCOSE 173*  --  149*  BUN 14  --  13  CREATININE 0.99 1.00 1.06  CALCIUM 9.2  --  8.6   Liver Function Tests:  Recent Labs Lab 11/24/12 0610  AST 9  ALT 9  ALKPHOS 122*  BILITOT 0.3  PROT 6.4  ALBUMIN 3.3*  CBC:  Recent Labs Lab 11/25/12 0625  11/26/12 1747 11/27/12 0618  WBC 6.9  < > 5.8 7.2  NEUTROABS 4.5  --   --    --   HGB 9.7*  < > 10.5* 9.0*  HCT 27.3*  < > 29.3* 24.4*  MCV 82.2  < > 82.5 80.3  PLT 219  < > 247 226  < > = values in this interval not displayed. Cardiac Enzymes: No results found for this basename: CKTOTAL, CKMB, CKMBINDEX, TROPONINI,  in the last 168 hours BNP: No components found with this basename: POCBNP,  CBG:  Recent Labs Lab 11/27/12 0747 11/27/12 1127  GLUCAP 183* 241*    Significant Diagnostic Studies:  Mr Foot Left Wo Contrast  11/24/2012  .  IMPRESSION:  1.  Oblique midshaft fracture of the proximal phalanx of the fourth toe, with edema in the proximal phalanx and middle phalanx with surrounding subcutaneous edema. On conventional radiography, the fracture was noted to extend into the base and distal head of the proximal phalanx.  Although a fracture is present, superimposed osteomyelitis involving the proximal phalanx of the middle phalanx is not readily excluded. 2.  Abnormal edema in the dorsal subcutaneous soft tissues of the foot, in the fourth toe, and in the plantar musculature of the foot, potentially representing cellulitis and myositis. 3.  Degenerative findings along the Lisfranc joint.   Original Report Authenticated By: Van Clines, M.D.    Dg Foot Complete Left  11/23/2012  *RADIOLOGY REPORT*  Clinical Data: Left foot pain.  Swelling, particularly involving the fourth toe.  LEFT FOOT - COMPLETE 3+ VIEW  Comparison: None.  Findings: Fractures involving both the base and the head of the proximal phalanx of the fourth toe, both fractures extending to the articular surfaces.  No other fractures.  Joint space narrowing involving IP joints of multiple toes.  Well-preserved bone mineral density.  Well-preserved joint spaces otherwise.  Small plantar calcaneal spur.  Soft tissue swelling involving the dorsum of the foot.  IMPRESSION: Fractures involving both the base and the head of the proximal phalanx of the fourth toe, with both fractures extending to the  articular  surfaces.   Original Report Authenticated By: Evangeline Dakin, M.D.        Disposition and Follow-up: Discharge Orders   Future Appointments Provider Department Dept Phone   12/10/2012 11:00 AM [facility] [phone]   12/10/2012 12:00 PM Princess Perna, NP [facility] [phone]   Future Orders Complete By Expires     Diet - low sodium heart healthy  As directed     Discharge wound care:  As directed     Comments:      Please cover the left foot all the time. Weight bearing as tolerated on left foot in a hard sole shoe Keep foot elevated Followup with Dr. [name] in 2 weeks for suture removal    Increase activity slowly  As directed         DISPOSITION: Home  DIET: Carb modified ACTIVITY: As tolerated  DISCHARGE FOLLOW-UP Follow-up Information   Follow up with Wylene Simmer, MD. Schedule an appointment as soon as possible for a visit in 2 weeks. (for dressing and suture removal)    Contact information:   [address], [address] [place] [address] [phone]       Follow up with Harold Bolt, MD. Schedule an appointment as soon as possible for a visit in 2 weeks.   Contact information:   [address] [place] [address] [phone]       Time spent on Discharge: 40 mins  Signed:   RAI,RIPUDEEP M.D. [facility] 11/27/2012, 12:16 PM Pager: [phone]

## 2012-11-30 LAB — TISSUE CULTURE: Gram Stain: NONE SEEN

## 2012-12-01 LAB — ANAEROBIC CULTURE: Gram Stain: NONE SEEN

## 2012-12-10 ENCOUNTER — Ambulatory Visit: Payer: BC Managed Care – HMO | Admitting: Neurosurgery

## 2012-12-10 ENCOUNTER — Other Ambulatory Visit (INDEPENDENT_AMBULATORY_CARE_PROVIDER_SITE_OTHER): Payer: BC Managed Care – PPO | Admitting: *Deleted

## 2012-12-10 DIAGNOSIS — Z48812 Encounter for surgical aftercare following surgery on the circulatory system: Secondary | ICD-10-CM

## 2012-12-10 DIAGNOSIS — I6529 Occlusion and stenosis of unspecified carotid artery: Secondary | ICD-10-CM

## 2012-12-16 ENCOUNTER — Other Ambulatory Visit: Payer: BC Managed Care – HMO

## 2012-12-16 ENCOUNTER — Ambulatory Visit: Payer: BC Managed Care – HMO | Admitting: Vascular Surgery

## 2012-12-18 ENCOUNTER — Telehealth: Payer: Self-pay | Admitting: *Deleted

## 2012-12-18 NOTE — Telephone Encounter (Signed)
Spoke with pt who reports he has appt scheduled with Dr. Andree Elk and would like to cancel appt on Jan 22, 2013 with Dr.McAlhany. He will call us if he would like to schedule appt with Dr. Angelena Form again.

## 2012-12-18 NOTE — Telephone Encounter (Signed)
Our office sent pt a letter requesting he reschedule appt he missed with Dr. Angelena Form on October 02, 2012. Pt returned letter with note indicating he missed appt as he was hospitalized at [facility] at that time. He asked for phone call if it was important that he reschedule. Appt was for 3 month follow up of PAD and DVT. I called pt to discuss. Left message to call back.

## 2012-12-18 NOTE — Telephone Encounter (Signed)
Spoke with pt. He has seen Dr.Adams and states he was told he did not have DVT.  He would like to follow up with Dr. Angelena Form for his PAD. Appt made for him to see Dr. Angelena Form on Jan 22, 2013 at 2:00.  He will stop by our office to sign release of information for [place] to have records sent here from Dr. Andree Elk.

## 2012-12-18 NOTE — Telephone Encounter (Signed)
Follow up  ° ° ° °Returning call back to nurse  °

## 2012-12-23 ENCOUNTER — Other Ambulatory Visit: Payer: Self-pay | Admitting: *Deleted

## 2012-12-23 DIAGNOSIS — Z48812 Encounter for surgical aftercare following surgery on the circulatory system: Secondary | ICD-10-CM

## 2012-12-24 ENCOUNTER — Encounter: Payer: Self-pay | Admitting: Vascular Surgery

## 2013-01-22 ENCOUNTER — Ambulatory Visit: Payer: BC Managed Care – PPO | Admitting: Cardiovascular Disease

## 2013-03-20 DIAGNOSIS — M79609 Pain in unspecified limb: Secondary | ICD-10-CM | POA: Insufficient documentation

## 2013-05-27 ENCOUNTER — Encounter (HOSPITAL_COMMUNITY): Payer: Self-pay | Admitting: Physical Medicine and Rehabilitation

## 2013-05-27 ENCOUNTER — Emergency Department (HOSPITAL_COMMUNITY)
Admission: EM | Admit: 2013-05-27 | Discharge: 2013-05-27 | Disposition: A | Discharge status: Home / Self Care | Payer: BC Managed Care – PPO | Attending: Emergency Medicine | Admitting: Emergency Medicine

## 2013-05-27 ENCOUNTER — Emergency Department (HOSPITAL_COMMUNITY): Payer: BC Managed Care – PPO

## 2013-05-27 DIAGNOSIS — M79672 Pain in left foot: Secondary | ICD-10-CM

## 2013-05-27 DIAGNOSIS — J4489 Other specified chronic obstructive pulmonary disease: Secondary | ICD-10-CM | POA: Insufficient documentation

## 2013-05-27 DIAGNOSIS — Z87891 Personal history of nicotine dependence: Secondary | ICD-10-CM | POA: Insufficient documentation

## 2013-05-27 DIAGNOSIS — E785 Hyperlipidemia, unspecified: Secondary | ICD-10-CM | POA: Insufficient documentation

## 2013-05-27 DIAGNOSIS — K219 Gastro-esophageal reflux disease without esophagitis: Secondary | ICD-10-CM | POA: Insufficient documentation

## 2013-05-27 DIAGNOSIS — M79671 Pain in right foot: Secondary | ICD-10-CM

## 2013-05-27 DIAGNOSIS — E119 Type 2 diabetes mellitus without complications: Secondary | ICD-10-CM | POA: Insufficient documentation

## 2013-05-27 DIAGNOSIS — Z79899 Other long term (current) drug therapy: Secondary | ICD-10-CM | POA: Insufficient documentation

## 2013-05-27 DIAGNOSIS — M79609 Pain in unspecified limb: Secondary | ICD-10-CM | POA: Insufficient documentation

## 2013-05-27 DIAGNOSIS — S98139A Complete traumatic amputation of one unspecified lesser toe, initial encounter: Secondary | ICD-10-CM | POA: Insufficient documentation

## 2013-05-27 DIAGNOSIS — Z7902 Long term (current) use of antithrombotics/antiplatelets: Secondary | ICD-10-CM | POA: Insufficient documentation

## 2013-05-27 DIAGNOSIS — M1990 Unspecified osteoarthritis, unspecified site: Secondary | ICD-10-CM | POA: Insufficient documentation

## 2013-05-27 DIAGNOSIS — I10 Essential (primary) hypertension: Secondary | ICD-10-CM | POA: Insufficient documentation

## 2013-05-27 DIAGNOSIS — Z9889 Other specified postprocedural states: Secondary | ICD-10-CM | POA: Insufficient documentation

## 2013-05-27 DIAGNOSIS — Z8669 Personal history of other diseases of the nervous system and sense organs: Secondary | ICD-10-CM | POA: Insufficient documentation

## 2013-05-27 DIAGNOSIS — J449 Chronic obstructive pulmonary disease, unspecified: Secondary | ICD-10-CM | POA: Insufficient documentation

## 2013-05-27 DIAGNOSIS — Z794 Long term (current) use of insulin: Secondary | ICD-10-CM | POA: Insufficient documentation

## 2013-05-27 LAB — BASIC METABOLIC PANEL
BUN: 11 mg/dL (ref 6–23)
CO2: 32 mEq/L (ref 19–32)
Calcium: 9.2 mg/dL (ref 8.4–10.5)
Chloride: 99 mEq/L (ref 96–112)
Creatinine, Ser: 0.98 mg/dL (ref 0.50–1.35)
GFR calc Af Amer: >90 mL/min (ref >90)
GFR calc non Af Amer: 89 mL/min — ABNORMAL LOW (ref >90)
Glucose, Bld: 144 mg/dL — ABNORMAL HIGH (ref 70–99)
Potassium: 3.1 mEq/L — ABNORMAL LOW (ref 3.5–5.1)
Sodium: 140 mEq/L (ref 135–145)

## 2013-05-27 LAB — CBC WITH DIFFERENTIAL/PLATELET
Basophils Absolute: 0 10*3/uL (ref 0.0–0.1)
Basophils Relative: 1 % (ref 0–1)
Eosinophils Absolute: 0.1 10*3/uL (ref 0.0–0.7)
Eosinophils Relative: 2 % (ref 0–5)
HCT: 33.1 % — ABNORMAL LOW (ref 39.0–52.0)
Hemoglobin: 12.2 g/dL — ABNORMAL LOW (ref 13.0–17.0)
Lymphocytes Relative: 20 % (ref 12–46)
Lymphs Abs: 1.3 10*3/uL (ref 0.7–4.0)
MCH: 30.9 pg (ref 26.0–34.0)
MCHC: 36.9 g/dL — ABNORMAL HIGH (ref 30.0–36.0)
MCV: 83.8 fL (ref 78.0–100.0)
Monocytes Absolute: 0.6 10*3/uL (ref 0.1–1.0)
Monocytes Relative: 9 % (ref 3–12)
Neutro Abs: 4.5 10*3/uL (ref 1.7–7.7)
Neutrophils Relative %: 69 % (ref 43–77)
Platelets: 205 10*3/uL (ref 150–400)
RBC: 3.95 MIL/uL — ABNORMAL LOW (ref 4.22–5.81)
RDW: 12.4 % (ref 11.5–15.5)
WBC: 6.5 10*3/uL (ref 4.0–10.5)

## 2013-05-27 MED ORDER — ALPRAZOLAM 0.25 MG PO TABS
0.2500 mg | ORAL_TABLET | Freq: Once | ORAL | Status: AC
  Administered 2013-05-27: 0.25 mg via ORAL
  Filled 2013-05-27: qty 1

## 2013-05-27 MED ORDER — PREGABALIN 75 MG PO CAPS
75.0000 mg | ORAL_CAPSULE | Freq: Once | ORAL | Status: AC
  Administered 2013-05-27: 75 mg via ORAL
  Filled 2013-05-27: qty 1

## 2013-05-27 NOTE — ED Notes (Signed)
Pt presents to department for evaluation of bilateral foot pain. States history of several toe amputations. States "I feel like I'm going to lose all of my toes" states all toes are numb and skin is pealing around/between toes. Pedal pulses present bilaterally. Sensation intact. Pt is alert and oriented x4.

## 2013-05-27 NOTE — ED Provider Notes (Signed)
CSN: [id number]     Arrival date & time 05/27/13  1628 History   First MD Initiated Contact with Patient 05/27/13 2217     Chief Complaint  Patient presents with  . Foot Pain   (Consider location/radiation/quality/duration/timing/severity/associated sxs/prior Treatment) HPI Comments: Patient is a 57 year old male past medical history significant for DM with peripheral neuropathy, PAD, h/o osteomyelitis, carotid artery occlusion presented to the emergency department for bilateral foot pain. Patient states he was concerned that his feet may have a recurrent cellulitis infection, called his orthopedist this afternoon and was advised to come to the ED for further evaluation. Patient endorses some increased numbness and burning in bilateral feet from his baseline. Patient endorses subjective chills, but has not taken his temperature at home.   Patient is a 57 y.o. male presenting with lower extremity pain.  Foot Pain Associated symptoms include arthralgias and myalgias. Pertinent negatives include no chest pain, fever, headaches, nausea or vomiting.    Past Medical History  Diagnosis Date  . Carotid artery occlusion 11/10/10    LEFT CAROTID ENDARTERECTOMY  . Diabetes mellitus   . Hypertension   . Hyperlipidemia   . COPD (chronic obstructive pulmonary disease)   . DJD (degenerative joint disease)   . GERD (gastroesophageal reflux disease)   . Substernal chest pain   . 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 Slurred speech     AS PER WIFE IN D/C NOTE 11/10/10  . PAD (peripheral artery disease)     Distal aortogram June 2012. Atherectomy left popliteal artery July 2012.   . Barrett's esophagus   . Fatty liver   . Diverticulosis of colon (without mention of hemorrhage)   . Sleep apnea   . Emphysema of lung   . Osteomyelitis   . Complication of anesthesia     BP WENT UP AT [facility] "   Past Surgical History  Procedure Laterality Date  . Laminectomy      X 3 LUMBAR AND  X 2 CERVICAL SPINE OPERATIONS  . Carotid endarterectomy  11/10/10  . Hernia repair      LEFT INGUINAL AND UMBILICAL REPAIRS  . Anterior fusion cervical spine  02/06/06    C4-5, C5-6, C6-7; SURGEON DR. MAX COHEN  . Penile prosthesis implant  08/14/05    INFRAPUBIC INSERTION OF INFLATABLE PENILE PROSTHESIS; SURGEON DR. Amalia Hailey  . Cardiac catheterization  10/31/04  . Laparoscopic cholecystectomy w/ cholangiography  11/09/04    SURGEON DR. Luella Cook  . Total knee arthroplasty  07/2002    RIGHT KNEE ; SURGEON  DR. [name] ALSO HAD ARTHROSCOPIC RIGHT KNEE IN  10/2001  . Shoulder arthroscopy    . Cholecystectomy    . Amputation  11/05/2011    Procedure: AMPUTATION RAY;  Surgeon: Wylene Simmer, MD;  Location: [facility];  Service: Orthopedics;  Laterality: Right;  Amputation of Right 4&5th Toes  . Femoral artery stent      x6  . Amputation Left 11/26/2012    Procedure: AMPUTATION RAY;  Surgeon: Wylene Simmer, MD;  Location: [facility];  Service: Orthopedics;  Laterality: Left;  fourth ray amputation   Family History  Problem Relation Age of Onset  . Heart disease Father   . Diabetes Father   . Cancer Father     PROSTATE  . Arthritis      GRANDMOTHER  . Hypertension      OTHER FAMILY MEMBERS  . Colon cancer Brother    History  Substance Use Topics  . Smoking status: Former Smoker -- 0.50 packs/day for 35 years    Types: Cigarettes    Quit date: 10/31/2011  . Smokeless tobacco: Never Used  . Alcohol Use: No     Comment: occasionally     Review of Systems  Constitutional: Negative for fever.  HENT: Negative.   Eyes: Negative.   Respiratory: Negative for shortness of breath.   Cardiovascular: Negative for chest pain.  Gastrointestinal: Negative for nausea and vomiting.  Musculoskeletal: Positive for myalgias and arthralgias.  Skin: Negative for wound.  Allergic/Immunologic: Positive for immunocompromised state.  Neurological: Negative for headaches.    Allergies  Ivp dye and Adhesive  Home  Medications   Current Outpatient Rx  Name  Route  Sig  Dispense  Refill  . ALPRAZolam (XANAX) 0.5 MG tablet   Oral   Take 0.25 mg by mouth 3 (three) times daily.          Marland Kitchen amLODipine-olmesartan (AZOR) 5-40 MG per tablet   Oral   Take 1 tablet by mouth daily.         Marland Kitchen atorvastatin (LIPITOR) 40 MG tablet   Oral   Take 40 mg by mouth daily.         . B Complex-C (B-COMPLEX WITH VITAMIN C) tablet   Oral   Take 1 tablet by mouth daily.         . carvedilol (COREG) 25 MG tablet   Oral   Take 12.5 mg by mouth 2 (two) times daily with a meal.          . clopidogrel (PLAVIX) 75 MG tablet      1 tab daily         . ezetimibe (ZETIA) 10 MG tablet   Oral   Take 10 mg by mouth every morning.         . fentaNYL (DURAGESIC - DOSED MCG/HR) 50 MCG/HR   Transdermal   Place 1 patch onto the skin every 3 (three) days.         . furosemide (LASIX) 80 MG tablet   Oral   Take 80 mg by mouth daily.         Marland Kitchen glyBURIDE-metformin (GLUCOVANCE) 2.5-500 MG per tablet   Oral   Take 2 tablets by mouth 2 (two) times daily.          Marland Kitchen HYDROmorphone (DILAUDID) 2 MG tablet   Oral   Take 1 tablet (2 mg total) by mouth every 4 (four) hours as needed for pain.   30 tablet   0   . ibuprofen (ADVIL,MOTRIN) 400 MG tablet   Oral   Take 1 tablet (400 mg total) by mouth every 6 (six) hours as needed for pain or fever.   60 tablet   3   . insulin glargine (LANTUS) 100 UNIT/ML injection   Subcutaneous   Inject 50 Units into the skin at bedtime.          . insulin glulisine (APIDRA) 100 UNIT/ML injection   Subcutaneous   Inject 10-20 Units into the skin 3 (three) times daily before meals. 10 breakfast 10 lunch and 20 supper         . lansoprazole (PREVACID) 30 MG capsule   Oral   Take 30 mg by mouth 2 (two) times daily.         Marland Kitchen omega-3 acid ethyl esters (LOVAZA) 1 G capsule   Oral   Take 2 g by mouth 2 (two)  times daily.          . ondansetron (ZOFRAN-ODT) 4  MG disintegrating tablet   Oral   Take 4 mg by mouth 4 (four) times daily as needed for nausea.         Marland Kitchen oxyCODONE-acetaminophen (PERCOCET/ROXICET) 5-325 MG per tablet   Oral   Take 1 tablet by mouth every 8 (eight) hours as needed for pain.         . pregabalin (LYRICA) 75 MG capsule   Oral   Take 75 mg by mouth 3 (three) times daily.          . promethazine (PHENERGAN) 12.5 MG tablet   Oral   Take 1 tablet (12.5 mg total) by mouth every 6 (six) hours as needed for nausea.   60 tablet   1   . rOPINIRole (REQUIP) 2 MG tablet   Oral   Take 2 mg by mouth at bedtime.          . Tamsulosin HCl (FLOMAX) 0.4 MG CAPS   Oral   Take 0.4 mg by mouth daily.         Marland Kitchen amoxicillin-clavulanate (AUGMENTIN) 875-125 MG per tablet   Oral   Take 1 tablet by mouth 2 (two) times daily. X 2 weeks   28 tablet   0   . POTASSIUM PO   Oral   Take 1 tablet by mouth daily.          BP 123/64  Pulse 66  Temp(Src) 97.4 F (36.3 C) (Oral)  Resp 18  SpO2 99% Physical Exam  Constitutional: He is oriented to person, place, and time. He appears well-developed and well-nourished.  HENT:  Head: Normocephalic and atraumatic.  Eyes: Conjunctivae are normal.  Neck: Neck supple.  Pulmonary/Chest: Effort normal and breath sounds normal.  Abdominal: Soft.  Musculoskeletal:       Right foot: He exhibits normal range of motion, no swelling and normal capillary refill.       Left foot: He exhibits normal range of motion, no swelling and normal capillary refill.       Feet:  Surgically amputated toes marked.    Neurological: He is alert and oriented to person, place, and time. A sensory deficit is present.  No sharp or dull sensation in either R or L toes, sharp sensation intact in bilateral calcaneal region.   Skin: Skin is warm and dry.    ED Course  Procedures (including critical care time) Labs Review Labs Reviewed  CBC WITH DIFFERENTIAL - Abnormal; Notable for the following:     RBC 3.95 (*)    Hemoglobin 12.2 (*)    HCT 33.1 (*)    MCHC 36.9 (*)    All other components within normal limits  BASIC METABOLIC PANEL - Abnormal; Notable for the following:    Potassium 3.1 (*)    Glucose, Bld 144 (*)    GFR calc non Af Amer 89 (*)    All other components within normal limits   Imaging Review Dg Foot Complete Left  05/27/2013   CLINICAL DATA:  Previous amputations, diabetes, pain.  EXAM: LEFT FOOT - COMPLETE 3+ VIEW  COMPARISON:  11/23/2012  FINDINGS: Interval amputation at the left 4th MTP joint . Negative for fracture, dislocation, or other acute abnormality. No focal cortical destruction is evident. Normal mineralization and alignment. Small calcaneal spur at the plantar aponeurosis. Regional soft tissues unremarkable.  IMPRESSION: Postop changes of left 4th toe amputation. No acute abnormality.  Electronically Signed   By: Arne Cleveland M.D.   On: 05/27/2013 19:45   Dg Foot Complete Right  05/27/2013   CLINICAL DATA:  Pain, peripheral vascular disease.  EXAM: RIGHT FOOT COMPLETE - 3+ VIEW  COMPARISON:  11/08/2011  FINDINGS: Interval transmetatarsal 4th and 5th toe amputations. Advanced degenerative changes at the 1st MTP joint with progressive spurring and fragmentation, multiple small calcific densities seen at the dorsal aspect of the joint, new since prior exam. Normal alignment. Negative for fracture or other acute bone abnormality. No focal cortical destruction.  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  Interval right 4th and 5th transmetatarsal amputation without apparent complication.   Electronically Signed   By: Arne Cleveland M.D.   On: 05/27/2013 19:47    MDM   1. Foot pain, bilateral    Afebrile, NAD, non-toxic appearing, AAOx4. No signs of silent infection, no erythema, no warmth. Capillary refill less than 3 seconds in remaining toes. Pedal pulses present bilaterally. Right third toe with a contusion and decreased range of motion. Sensation  diminished likely secondary to peripheral neuropathy per patient history. I have reviewed nursing notes, vital signs, and all appropriate lab and imaging results for this patient. No signs of any impending cellulitic infection or osteomyelitis. At this time no indication for any antibiotics to be started. Return precautions discussed. Advised PCP and orthopedic followup. Patient agreeable to plan. Patient d/w with Dr. Rogene Houston, agrees with plan. Patient is stable at time of discharge        Harlow Mares, PA-C 05/28/13 [id number]

## 2013-05-27 NOTE — ED Provider Notes (Signed)
Medical screening examination/treatment/procedure(s) were conducted as a shared visit with non-physician practitioner(s) and myself.  I personally evaluated the patient during the encounter  Patient's long-term diabetic. Patient seen by me. Patient's concern is for purplish coloration in his right toe. Patient has no sensation in his feet. Patient did have a wound between his big toe and the second toe but that's healing well. Patient concerned about losing of the toe. Today's workup shows that he has excellent cap refill in all toes and his right foot 2 seconds or better. There appears to be some bruising on top of the toe the patient denies any trauma however he has no sensation. Possibly could be some microvascular ischemia occurring but flow into the toe in general is excellent. She will be discharged home with close followup. X-rays were negative. Showed no evidence of osteophytes.  Mervin Kung, MD 05/27/13 [phone]

## 2013-05-29 NOTE — ED Provider Notes (Signed)
Medical screening examination/treatment/procedure(s) were conducted as a shared visit with non-physician practitioner(s) and myself.  I personally evaluated the patient during the encounter  Mervin Kung, MD 05/29/13 [phone]

## 2013-08-03 ENCOUNTER — Ambulatory Visit: Payer: Self-pay | Admitting: Podiatry

## 2013-08-10 ENCOUNTER — Ambulatory Visit: Payer: Self-pay | Admitting: Podiatry

## 2013-09-07 ENCOUNTER — Ambulatory Visit (INDEPENDENT_AMBULATORY_CARE_PROVIDER_SITE_OTHER): Payer: BC Managed Care – PPO | Admitting: Podiatry

## 2013-09-07 ENCOUNTER — Encounter: Payer: Self-pay | Admitting: Podiatry

## 2013-09-07 VITALS — BP 164/84 | HR 66 | Resp 12

## 2013-09-07 DIAGNOSIS — E1149 Type 2 diabetes mellitus with other diabetic neurological complication: Secondary | ICD-10-CM

## 2013-09-07 DIAGNOSIS — B351 Tinea unguium: Secondary | ICD-10-CM

## 2013-09-07 DIAGNOSIS — M79609 Pain in unspecified limb: Secondary | ICD-10-CM

## 2013-09-07 DIAGNOSIS — M2040 Other hammer toe(s) (acquired), unspecified foot: Secondary | ICD-10-CM

## 2013-09-07 DIAGNOSIS — E1159 Type 2 diabetes mellitus with other circulatory complications: Secondary | ICD-10-CM

## 2013-09-07 NOTE — Progress Notes (Signed)
Subjective:     Patient ID: [name], male   DOB: 1956/04/26, 57 y.o.   MRN: [id number]  HPI patient is poorly controlled long-term diabetic with vascular nerve disease who has already lost 3 toes do to osteomyelitis and presents with nail disease of remaining nails thick and painful   Review of Systems     Objective:   Physical Exam Neurovascular status diminished with diminished muscle strength and diminished range of motion noted with loss of 2 toes right foot 1 joint left foot. All remaining nails are thickened dystrophic and painful when pressed    Assessment:     At wrist diabetic who is oriented loss 3 toes with nail disease time 7    Plan:     Debridement of nailbeds both feet with no iatrogenic bleeding noted and discussed diabetic shoes which are doing February which are a very good for this patient due to his risk factors pressed

## 2013-09-07 NOTE — Progress Notes (Signed)
   Subjective:    Patient ID: [name], male    DOB: Jan 27, 1956, 57 y.o.   MRN: [id number]  HPI Comments: '' TOENAILS TRIM.''     Review of Systems     Objective:   Physical Exam        Assessment & Plan:

## 2013-09-25 ENCOUNTER — Other Ambulatory Visit: Payer: Self-pay | Admitting: Orthopaedic Surgery

## 2013-09-25 DIAGNOSIS — M545 Low back pain, unspecified: Secondary | ICD-10-CM

## 2013-09-25 DIAGNOSIS — M542 Cervicalgia: Secondary | ICD-10-CM

## 2013-10-03 ENCOUNTER — Ambulatory Visit
Admission: RE | Admit: 2013-10-03 | Discharge: 2013-10-03 | Disposition: A | Discharge status: Home / Self Care | Payer: BC Managed Care – PPO | Source: Ambulatory Visit | Attending: Orthopaedic Surgery | Admitting: Orthopaedic Surgery

## 2013-10-03 DIAGNOSIS — M542 Cervicalgia: Secondary | ICD-10-CM

## 2013-10-03 DIAGNOSIS — M545 Low back pain, unspecified: Secondary | ICD-10-CM

## 2013-10-13 ENCOUNTER — Other Ambulatory Visit: Payer: Self-pay | Admitting: Orthopedic Surgery

## 2013-10-14 ENCOUNTER — Other Ambulatory Visit: Payer: Self-pay | Admitting: Vascular Surgery

## 2013-10-14 DIAGNOSIS — Z48812 Encounter for surgical aftercare following surgery on the circulatory system: Secondary | ICD-10-CM

## 2013-10-14 DIAGNOSIS — I6529 Occlusion and stenosis of unspecified carotid artery: Secondary | ICD-10-CM

## 2013-10-15 ENCOUNTER — Encounter (HOSPITAL_BASED_OUTPATIENT_CLINIC_OR_DEPARTMENT_OTHER): Payer: Self-pay | Admitting: *Deleted

## 2013-10-15 NOTE — Progress Notes (Signed)
Bring all medications. Discussed history with Dr. Ola Spurr - Sleep apnea but does not use CPAP. Dr. Ola Spurr said for pt to plan to spend the night. Also called Dr. Andree Elk at [facility] [place] to see if pt could stop Plavix before surgery - left message for nurse to let me know. Told pt to pack an overnight bag. Requested EKG and office notes from [name].

## 2013-10-16 ENCOUNTER — Encounter (HOSPITAL_BASED_OUTPATIENT_CLINIC_OR_DEPARTMENT_OTHER)
Admission: RE | Admit: 2013-10-16 | Discharge: 2013-10-16 | Disposition: A | Discharge status: Home / Self Care | Payer: BC Managed Care – PPO | Source: Ambulatory Visit | Attending: Orthopedic Surgery | Admitting: Orthopedic Surgery

## 2013-10-16 LAB — BASIC METABOLIC PANEL
BUN: 16 mg/dL (ref 6–23)
CO2: 26 mEq/L (ref 19–32)
Calcium: 8.8 mg/dL (ref 8.4–10.5)
Chloride: 99 mEq/L (ref 96–112)
Creatinine, Ser: 0.94 mg/dL (ref 0.50–1.35)
GFR calc Af Amer: >90 mL/min (ref >90)
GFR calc non Af Amer: 90 mL/min — ABNORMAL LOW (ref >90)
Glucose, Bld: 129 mg/dL — ABNORMAL HIGH (ref 70–99)
Potassium: 4.2 mEq/L (ref 3.7–5.3)
Sodium: 141 mEq/L (ref 137–147)

## 2013-10-16 NOTE — Progress Notes (Signed)
Called back to Dr. Andree Elk office , left message with Nira Conn that we need only to know if pt can stop Plavix and if so how many days before surgery - requested a note or have them leave message on my  Voicemail.

## 2013-10-21 ENCOUNTER — Encounter (HOSPITAL_BASED_OUTPATIENT_CLINIC_OR_DEPARTMENT_OTHER): Payer: Self-pay | Admitting: Orthopedic Surgery

## 2013-10-21 ENCOUNTER — Ambulatory Visit (HOSPITAL_BASED_OUTPATIENT_CLINIC_OR_DEPARTMENT_OTHER)
Admission: RE | Admit: 2013-10-21 | Discharge: 2013-10-21 | Disposition: A | Discharge status: Home / Self Care | Payer: BC Managed Care – PPO | Source: Ambulatory Visit | Attending: Orthopedic Surgery | Admitting: Orthopedic Surgery

## 2013-10-21 ENCOUNTER — Encounter (HOSPITAL_BASED_OUTPATIENT_CLINIC_OR_DEPARTMENT_OTHER)
Admission: RE | Disposition: A | Discharge status: Home / Self Care | Payer: Self-pay | Source: Ambulatory Visit | Attending: Orthopedic Surgery

## 2013-10-21 ENCOUNTER — Ambulatory Visit (HOSPITAL_BASED_OUTPATIENT_CLINIC_OR_DEPARTMENT_OTHER): Payer: BC Managed Care – PPO | Admitting: Anesthesiology

## 2013-10-21 ENCOUNTER — Encounter (HOSPITAL_BASED_OUTPATIENT_CLINIC_OR_DEPARTMENT_OTHER): Payer: BC Managed Care – PPO | Admitting: Anesthesiology

## 2013-10-21 DIAGNOSIS — K219 Gastro-esophageal reflux disease without esophagitis: Secondary | ICD-10-CM | POA: Insufficient documentation

## 2013-10-21 DIAGNOSIS — E785 Hyperlipidemia, unspecified: Secondary | ICD-10-CM | POA: Insufficient documentation

## 2013-10-21 DIAGNOSIS — J438 Other emphysema: Secondary | ICD-10-CM | POA: Insufficient documentation

## 2013-10-21 DIAGNOSIS — I10 Essential (primary) hypertension: Secondary | ICD-10-CM | POA: Insufficient documentation

## 2013-10-21 DIAGNOSIS — G5620 Lesion of ulnar nerve, unspecified upper limb: Secondary | ICD-10-CM | POA: Insufficient documentation

## 2013-10-21 DIAGNOSIS — Z87891 Personal history of nicotine dependence: Secondary | ICD-10-CM | POA: Insufficient documentation

## 2013-10-21 DIAGNOSIS — E1149 Type 2 diabetes mellitus with other diabetic neurological complication: Secondary | ICD-10-CM | POA: Insufficient documentation

## 2013-10-21 DIAGNOSIS — E1142 Type 2 diabetes mellitus with diabetic polyneuropathy: Secondary | ICD-10-CM | POA: Insufficient documentation

## 2013-10-21 DIAGNOSIS — G5600 Carpal tunnel syndrome, unspecified upper limb: Secondary | ICD-10-CM | POA: Insufficient documentation

## 2013-10-21 DIAGNOSIS — Z794 Long term (current) use of insulin: Secondary | ICD-10-CM | POA: Insufficient documentation

## 2013-10-21 HISTORY — DX: Myoneural disorder, unspecified: G70.9

## 2013-10-21 HISTORY — PX: CARPAL TUNNEL RELEASE: SHX101

## 2013-10-21 HISTORY — PX: ULNAR NERVE TRANSPOSITION: SHX2595

## 2013-10-21 HISTORY — DX: Personal history of other diseases of the digestive system: Z87.19

## 2013-10-21 LAB — GLUCOSE, CAPILLARY
Glucose-Capillary: 74 mg/dL (ref 70–99)
Glucose-Capillary: 144 mg/dL — ABNORMAL HIGH (ref 70–99)

## 2013-10-21 LAB — POCT HEMOGLOBIN-HEMACUE: Hemoglobin: 12.4 g/dL — ABNORMAL LOW (ref 13.0–17.0)

## 2013-10-21 SURGERY — CARPAL TUNNEL RELEASE
Anesthesia: Regional | Site: Wrist | Laterality: Right

## 2013-10-21 MED ORDER — OXYCODONE HCL 5 MG PO TABS: 5.0000 mg | ORAL_TABLET | Freq: Once | ORAL | Status: DC | PRN

## 2013-10-21 MED ORDER — FENTANYL CITRATE 0.05 MG/ML IJ SOLN
50.0000 ug | INTRAMUSCULAR | Status: DC | PRN
  Administered 2013-10-21: 100 ug via INTRAVENOUS

## 2013-10-21 MED ORDER — MIDAZOLAM HCL 2 MG/2ML IJ SOLN
1.0000 mg | INTRAMUSCULAR | Status: DC | PRN
  Administered 2013-10-21: 2 mg via INTRAVENOUS

## 2013-10-21 MED ORDER — HYDROMORPHONE HCL PF 1 MG/ML IJ SOLN: 0.2500 mg | INTRAMUSCULAR | Status: DC | PRN

## 2013-10-21 MED ORDER — FENTANYL CITRATE 0.05 MG/ML IJ SOLN
INTRAMUSCULAR | Status: AC
  Filled 2013-10-21: qty 4

## 2013-10-21 MED ORDER — FENTANYL CITRATE 0.05 MG/ML IJ SOLN
INTRAMUSCULAR | Status: AC
  Filled 2013-10-21: qty 2

## 2013-10-21 MED ORDER — DEXTROSE 5 % IV SOLN
INTRAVENOUS | Status: DC | PRN
  Administered 2013-10-21: 11:00:00 via INTRAVENOUS

## 2013-10-21 MED ORDER — CHLORHEXIDINE GLUCONATE 4 % EX LIQD: 60.0000 mL | Freq: Once | CUTANEOUS | Status: DC

## 2013-10-21 MED ORDER — BUPIVACAINE-EPINEPHRINE PF 0.5-1:200000 % IJ SOLN
INTRAMUSCULAR | Status: DC | PRN
  Administered 2013-10-21: 25 mL via PERINEURAL

## 2013-10-21 MED ORDER — CEFAZOLIN SODIUM-DEXTROSE 2-3 GM-% IV SOLR
2.0000 g | INTRAVENOUS | Status: AC
  Administered 2013-10-21: 2 g via INTRAVENOUS

## 2013-10-21 MED ORDER — CEFAZOLIN SODIUM-DEXTROSE 2-3 GM-% IV SOLR
INTRAVENOUS | Status: AC
  Filled 2013-10-21: qty 50

## 2013-10-21 MED ORDER — ONDANSETRON HCL 4 MG/2ML IJ SOLN: 4.0000 mg | Freq: Once | INTRAMUSCULAR | Status: DC | PRN

## 2013-10-21 MED ORDER — LACTATED RINGERS IV SOLN
INTRAVENOUS | Status: DC
  Administered 2013-10-21 (×2): via INTRAVENOUS

## 2013-10-21 MED ORDER — MIDAZOLAM HCL 2 MG/2ML IJ SOLN
INTRAMUSCULAR | Status: AC
  Filled 2013-10-21: qty 2

## 2013-10-21 MED ORDER — BUPIVACAINE HCL (PF) 0.25 % IJ SOLN
INTRAMUSCULAR | Status: AC
  Filled 2013-10-21: qty 30

## 2013-10-21 MED ORDER — ONDANSETRON HCL 4 MG/2ML IJ SOLN
INTRAMUSCULAR | Status: DC | PRN
  Administered 2013-10-21: 4 mg via INTRAVENOUS

## 2013-10-21 MED ORDER — OXYCODONE-ACETAMINOPHEN 10-325 MG PO TABS: 1.0000 | ORAL_TABLET | ORAL | Status: DC | PRN

## 2013-10-21 MED ORDER — LIDOCAINE HCL (CARDIAC) 20 MG/ML IV SOLN
INTRAVENOUS | Status: DC | PRN
  Administered 2013-10-21: 100 mg via INTRAVENOUS

## 2013-10-21 MED ORDER — OXYCODONE HCL 5 MG/5ML PO SOLN: 5.0000 mg | Freq: Once | ORAL | Status: DC | PRN

## 2013-10-21 MED ORDER — PROPOFOL 10 MG/ML IV BOLUS
INTRAVENOUS | Status: DC | PRN
  Administered 2013-10-21: 200 mg via INTRAVENOUS

## 2013-10-21 SURGICAL SUPPLY — 56 items
BLADE MINI RND TIP GREEN BEAV (BLADE) ×1 IMPLANT
BLADE SURG 15 STRL LF DISP TIS (BLADE) ×2 IMPLANT
BLADE SURG 15 STRL SS (BLADE) ×3
BNDG CMPR 9X4 STRL LF SNTH (GAUZE/BANDAGES/DRESSINGS) ×2
BNDG COHESIVE 3X5 TAN STRL LF (GAUZE/BANDAGES/DRESSINGS) ×4 IMPLANT
BNDG ESMARK 4X9 LF (GAUZE/BANDAGES/DRESSINGS) ×1 IMPLANT
BNDG GAUZE ELAST 4 BULKY (GAUZE/BANDAGES/DRESSINGS) ×3 IMPLANT
CHLORAPREP W/TINT 26ML (MISCELLANEOUS) ×3 IMPLANT
CORDS BIPOLAR (ELECTRODE) ×3 IMPLANT
COVER MAYO STAND STRL (DRAPES) ×3 IMPLANT
COVER TABLE BACK 60X90 (DRAPES) ×3 IMPLANT
CUFF TOURNIQUET SINGLE 18IN (TOURNIQUET CUFF) ×2 IMPLANT
CUFF TOURNIQUET SINGLE 24IN (TOURNIQUET CUFF) ×1 IMPLANT
DECANTER SPIKE VIAL GLASS SM (MISCELLANEOUS) IMPLANT
DRAPE EXTREMITY T 121X128X90 (DRAPE) ×3 IMPLANT
DRAPE SURG 17X23 STRL (DRAPES) ×3 IMPLANT
DRSG KUZMA FLUFF (GAUZE/BANDAGES/DRESSINGS) IMPLANT
GAUZE SPONGE 4X4 16PLY XRAY LF (GAUZE/BANDAGES/DRESSINGS) IMPLANT
GAUZE XEROFORM 1X8 LF (GAUZE/BANDAGES/DRESSINGS) ×3 IMPLANT
GLOVE BIOGEL PI IND STRL 7.0 (GLOVE) IMPLANT
GLOVE BIOGEL PI IND STRL 8.5 (GLOVE) ×2 IMPLANT
GLOVE BIOGEL PI INDICATOR 7.0 (GLOVE) ×1
GLOVE BIOGEL PI INDICATOR 8.5 (GLOVE) ×1
GLOVE ECLIPSE 6.5 STRL STRAW (GLOVE) ×1 IMPLANT
GLOVE EXAM NITRILE LRG STRL (GLOVE) ×1 IMPLANT
GLOVE SURG ORTHO 8.0 STRL STRW (GLOVE) ×3 IMPLANT
GOWN STRL REUS W/ TWL LRG LVL3 (GOWN DISPOSABLE) ×2 IMPLANT
GOWN STRL REUS W/TWL LRG LVL3 (GOWN DISPOSABLE) ×3
GOWN STRL REUS W/TWL XL LVL3 (GOWN DISPOSABLE) ×3 IMPLANT
LOOP VESSEL MAXI BLUE (MISCELLANEOUS) IMPLANT
NDL SUT 6 .5 CRC .975X.05 MAYO (NEEDLE) IMPLANT
NEEDLE 27GAX1X1/2 (NEEDLE) IMPLANT
NEEDLE MAYO TAPER (NEEDLE)
NS IRRIG 1000ML POUR BTL (IV SOLUTION) ×3 IMPLANT
PACK BASIN DAY SURGERY FS (CUSTOM PROCEDURE TRAY) ×3 IMPLANT
PAD ABD 8X10 STRL (GAUZE/BANDAGES/DRESSINGS) ×4 IMPLANT
PAD CAST 3X4 CTTN HI CHSV (CAST SUPPLIES) ×2 IMPLANT
PAD CAST 4YDX4 CTTN HI CHSV (CAST SUPPLIES) ×2 IMPLANT
PADDING CAST ABS 4INX4YD NS (CAST SUPPLIES)
PADDING CAST ABS COTTON 4X4 ST (CAST SUPPLIES) ×2 IMPLANT
PADDING CAST COTTON 3X4 STRL (CAST SUPPLIES) ×3
PADDING CAST COTTON 4X4 STRL (CAST SUPPLIES) ×3
SLEEVE SCD COMPRESS KNEE MED (MISCELLANEOUS) ×1 IMPLANT
SLING ARM XL FOAM STRAP (SOFTGOODS) ×1 IMPLANT
SPLINT PLASTER CAST XFAST 3X15 (CAST SUPPLIES) IMPLANT
SPLINT PLASTER XTRA FASTSET 3X (CAST SUPPLIES)
SPONGE GAUZE 4X4 12PLY (GAUZE/BANDAGES/DRESSINGS) ×3 IMPLANT
STOCKINETTE 4X48 STRL (DRAPES) ×3 IMPLANT
SUT VIC AB 2-0 SH 27 (SUTURE) ×3
SUT VIC AB 2-0 SH 27XBRD (SUTURE) ×2 IMPLANT
SUT VICRYL 4-0 PS2 18IN ABS (SUTURE) IMPLANT
SUT VICRYL RAPIDE 4/0 PS 2 (SUTURE) ×3 IMPLANT
SYR BULB 3OZ (MISCELLANEOUS) ×3 IMPLANT
SYR CONTROL 10ML LL (SYRINGE) ×2 IMPLANT
TOWEL OR 17X24 6PK STRL BLUE (TOWEL DISPOSABLE) ×3 IMPLANT
UNDERPAD 30X30 INCONTINENT (UNDERPADS AND DIAPERS) ×3 IMPLANT

## 2013-10-21 NOTE — Transfer of Care (Signed)
Immediate Anesthesia Transfer of Care Note  Patient: [name]  Procedure(s) Performed: Procedure(s): RIGHT CARPAL TUNNEL RELEASE (Right) RIGHT ELBOW  ULNAR NERVE DECOMPRESSION (Right)  Patient Location: PACU  Anesthesia Type:GA combined with regional for post-op pain  Level of Consciousness: awake, alert  and patient cooperative  Airway & Oxygen Therapy: Patient Spontanous Breathing and Patient connected to face mask oxygen  Post-op Assessment: Report given to PACU RN, Post -op Vital signs reviewed and stable and Patient moving all extremities  Post vital signs: Reviewed and stable  Complications: No apparent anesthesia complications

## 2013-10-21 NOTE — Op Note (Signed)
Dictation Number [id number]

## 2013-10-21 NOTE — Brief Op Note (Signed)
10/21/2013  11:35 AM  PATIENT:  [name]  58 y.o. male  PRE-OPERATIVE DIAGNOSIS:  RIGHT CARPAL AND CUBITAL TUNNEL SYNDROME  POST-OPERATIVE DIAGNOSIS:  RIGHT CARPAL AND CUBITAL TUNNEL SYNDROME  PROCEDURE:  Procedure(s): RIGHT CARPAL TUNNEL RELEASE (Right) RIGHT ELBOW  ULNAR NERVE DECOMPRESSION (Right)  SURGEON:  Surgeon(s) and Role:    * Wynonia Sours, MD - Primary  PHYSICIAN ASSISTANT:   ASSISTANTS: none   ANESTHESIA:   regional and general  EBL:  Total I/O In: 950 [I.V.:950] Out: -   BLOOD ADMINISTERED:none  DRAINS: none   LOCAL MEDICATIONS USED:  NONE  SPECIMEN:  No Specimen  DISPOSITION OF SPECIMEN:  N/A  COUNTS:  YES  TOURNIQUET:   Total Tourniquet Time Documented: Upper Arm (Right) - 34 minutes Total: Upper Arm (Right) - 34 minutes   DICTATION: .Other Dictation: Dictation Number [id number]  PLAN OF CARE: Discharge to home after PACU  PATIENT DISPOSITION:  PACU - hemodynamically stable.
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
## 2013-10-21 NOTE — Discharge Instructions (Addendum)
[facility] Instructions °Hand Surgery ° °Wound Care: °Keep your hand elevated above the level of your heart.  Do not allow it to dangle by your side.  Keep the dressing dry and do not remove it unless your doctor advises you to do so.  He will usually change it at the time of your post-op visit.  Moving your fingers is advised to stimulate circulation but will depend on the site of your surgery.  If you have a splint applied, your doctor will advise you regarding movement. ° °Activity: °Do not drive or operate machinery today.  Rest today and then you may return to your normal activity and work as indicated by your physician. ° °Diet:  °Drink liquids today or eat a light diet.  You may resume a regular diet tomorrow.   ° °General expectations: °Pain for two to three days. °Fingers may become slightly swollen. ° °Call your doctor if any of the following occur: °Severe pain not relieved by pain medication. °Elevated temperature. °Dressing soaked with blood. °Inability to move fingers. °White or bluish color to fingers. ° ° °Post Anesthesia Home Care Instructions ° °Activity: °Get plenty of rest for the remainder of the day. A responsible adult should stay with you for 24 hours following the procedure.  °For the next 24 hours, DO NOT: °-Drive a car °-Operate machinery °-Drink alcoholic beverages °-Take any medication unless instructed by your physician °-Make any legal decisions or sign important papers. ° °Meals: °Start with liquid foods such as gelatin or soup. Progress to regular foods as tolerated. Avoid greasy, spicy, heavy foods. If nausea and/or vomiting occur, drink only clear liquids until the nausea and/or vomiting subsides. Call your physician if vomiting continues. ° °Special Instructions/Symptoms: °Your throat may feel dry or sore from the anesthesia or the breathing tube placed in your throat during surgery. If this causes discomfort, gargle with warm salt water. The discomfort should disappear within 24  hours. ° ° °Regional Anesthesia Blocks ° °1. Numbness or the inability to move the "blocked" extremity may last from 3-48 hours after placement. The length of time depends on the medication injected and your individual response to the medication. If the numbness is not going away after 48 hours, call your surgeon. ° °2. The extremity that is blocked will need to be protected until the numbness is gone and the  Strength has returned. Because you cannot feel it, you will need to take extra care to avoid injury. Because it may be weak, you may have difficulty moving it or using it. You may not know what position it is in without looking at it while the block is in effect. ° °3. For blocks in the legs and feet, returning to weight bearing and walking needs to be done carefully. You will need to wait until the numbness is entirely gone and the strength has returned. You should be able to move your leg and foot normally before you try and bear weight or walk. You will need someone to be with you when you first try to ensure you do not fall and possibly risk injury. ° °4. Bruising and tenderness at the needle site are common side effects and will resolve in a few days. ° °5. Persistent numbness or new problems with movement should be communicated to the surgeon or the [facility] ([phone])/ [facility] ([phone]). °

## 2013-10-21 NOTE — Anesthesia Postprocedure Evaluation (Signed)
  Anesthesia Post-op Note  Patient: [name]  Procedure(s) Performed: Procedure(s): RIGHT CARPAL TUNNEL RELEASE (Right) RIGHT ELBOW  ULNAR NERVE DECOMPRESSION (Right)  Patient Location: PACU  Anesthesia Type:GA combined with regional for post-op pain  Level of Consciousness: awake, alert  and oriented  Airway and Oxygen Therapy: Patient Spontanous Breathing and Patient connected to face mask oxygen  Post-op Pain: none  Post-op Assessment: Post-op Vital signs reviewed  Post-op Vital Signs: Reviewed  Complications: No apparent anesthesia complications

## 2013-10-21 NOTE — Anesthesia Procedure Notes (Addendum)
Anesthesia Regional Block:  Supraclavicular block  Pre-Anesthetic Checklist: ,, timeout performed, Correct Patient, Correct Site, Correct Laterality, Correct Procedure, Correct Position, site marked, Risks and benefits discussed,  Surgical consent,  Pre-op evaluation,  At surgeon's request and post-op pain management  Laterality: Right and Upper  Prep: chloraprep       Needles:  Injection technique: Single-shot  Needle Type: Echogenic Stimulator Needle     Needle Length: 5cm 5 cm Needle Gauge: 21 and 21 G    Additional Needles:  Procedures: ultrasound guided (picture in chart) Supraclavicular block Narrative:  Start time: 10/21/2013 10:21 AM End time: 10/21/2013 10:27 AM Injection made incrementally with aspirations every 5 mL.  Performed by: Personally  Anesthesiologist: Lorrene Reid   Procedure Name: LMA Insertion Date/Time: 10/21/2013 10:42 AM Performed by: Lyndee Leo Pre-anesthesia Checklist: Patient identified, Emergency Drugs available, Suction available and Patient being monitored Patient Re-evaluated:Patient Re-evaluated prior to inductionOxygen Delivery Method: Circle System Utilized Preoxygenation: Pre-oxygenation with 100% oxygen Intubation Type: IV induction Ventilation: Mask ventilation without difficulty LMA: LMA inserted LMA Size: 4.0 Number of attempts: 1 Airway Equipment and Method: bite block Placement Confirmation: positive ETCO2 Tube secured with: Tape Dental Injury: Teeth and Oropharynx as per pre-operative assessment

## 2013-10-21 NOTE — Progress Notes (Signed)
Assisted Dr. Crews with right, ultrasound guided, supraclavicular block. Side rails up, monitors on throughout procedure. See vital signs in flow sheet. Tolerated Procedure well. 

## 2013-10-21 NOTE — Anesthesia Preprocedure Evaluation (Signed)
Anesthesia Evaluation  Patient identified by MRN, date of birth, ID band Patient awake    Reviewed: Allergy & Precautions, H&P , NPO status , Patient's Chart, lab work & pertinent test results  Airway Mallampati: I TM Distance: >3 FB Neck ROM: Full    Dental  (+) Teeth Intact, Dental Advisory Given, Upper Dentures, Edentulous Upper   Pulmonary former smoker,  breath sounds clear to auscultation        Cardiovascular hypertension, Pt. on medications Rhythm:Regular Rate:Normal     Neuro/Psych    GI/Hepatic GERD-  Medicated and Controlled,  Endo/Other  diabetes, Well Controlled, Type 2, Oral Hypoglycemic AgentsMorbid obesity  Renal/GU      Musculoskeletal   Abdominal   Peds  Hematology   Anesthesia Other Findings   Reproductive/Obstetrics                           Anesthesia Physical Anesthesia Plan  ASA: III  Anesthesia Plan: General   Post-op Pain Management:    Induction: Intravenous  Airway Management Planned: LMA  Additional Equipment:   Intra-op Plan:   Post-operative Plan: Extubation in OR  Informed Consent: I have reviewed the patients History and Physical, chart, labs and discussed the procedure including the risks, benefits and alternatives for the proposed anesthesia with the patient or authorized representative who has indicated his/her understanding and acceptance.   Dental advisory given  Plan Discussed with: CRNA, Anesthesiologist and Surgeon  Anesthesia Plan Comments:         Anesthesia Quick Evaluation

## 2013-10-22 ENCOUNTER — Encounter (HOSPITAL_BASED_OUTPATIENT_CLINIC_OR_DEPARTMENT_OTHER): Payer: Self-pay | Admitting: Orthopedic Surgery

## 2013-10-22 NOTE — Addendum Note (Signed)
Addendum created 10/22/13 1236 by Tawni Millers, CRNA   Modules edited: Charges VN

## 2013-10-22 NOTE — Op Note (Signed)
[name], [name]                 ACCOUNT NO.:  [id number]  MEDICAL RECORD NO.:  [id number]  LOCATION:                                 FACILITY:  PHYSICIAN:  Daryll Brod, M.D.       DATE OF BIRTH:  [birth year]  DATE OF PROCEDURE:  10/11/2013 DATE OF DISCHARGE:                              OPERATIVE REPORT   PREOPERATIVE DIAGNOSES:  Carpal tunnel syndrome, right hand; cubital tunnel syndrome, right elbow.  POSTOPERATIVE DIAGNOSES:  Carpal tunnel syndrome, right hand; cubital tunnel syndrome, right elbow.  OPERATION:  Decompression of right median nerve at the wrist, decompression of right ulnar nerve at the elbow.  SURGEON:  Daryll Brod, M.D.  ASSISTANT:  None.  ANESTHESIA:  Supraclavicular block, general.  ANESTHESIOLOGIST:  Crews.  HISTORY:  The patient is a 58 year old male with a history of hand pain. He has had nerve conductions done revealing carpal tunnel syndrome on his right side, cubital tunnel syndrome on his right elbow.  He has elected to proceed to have each decompressed with possibility of transposition to the ulnar nerve at the elbow depending on postoperative postdecompression subluxation.  In the preoperative area, the patient is seen.  The extremity marked by both patient and surgeon.  He is aware of risks and complications including infection; recurrence of injury to arteries, nerves, tendons, incomplete relief of symptoms, dystrophy.  In preoperative area, the patient is seen, the extremity marked by both patient and surgeon.  Antibiotic given.  PROCEDURE IN DETAIL:  The patient was brought to the operating room, where a general anesthetic was carried out without difficulty after a supraclavicular block was given in the preoperative area, under the direction of Dr. Al Corpus.  He was prepped using ChloraPrep, supine position, right arm free.  A 3-minute dry time was allowed.  Time-out taken, confirming the patient and procedure.  The limb was  exsanguinated with an Esmarch bandage.  Tourniquet was placed high on the arm was inflated to 250 mmHg.  A longitudinal incision was made in the right palm, carried down through subcutaneous tissue.  Bleeders were electrocauterized.  Palmar fascia was split.  Superficial palmar arch identified.  The flexor tendon to the ring and little finger identified to the ulnar side of the median nerve.  The carpal retinaculum was incised with sharp dissection.  A right-angle and Sewall retractor were placed between the forearm fascia.  The fascia was released for approximately a centimeter and half to 2 under direct vision, proximal to the wrist crease.  Canal was explored.  Air compression to the nerve was apparent.  Motor branch entered into the muscle.  No further lesions were identified.  The wound was irrigated and the skin closed with 4-0 Vicryl Rapide sutures.  A longitudinal incision was then made over the medial epicondyle slightly posterior of the right elbow carried down through subcutaneous tissue.  The posterior branches of the medial antebrachial cutaneous nerve of the forearm were identified and protected.  The dissection carried down and anconeus epitrochlearis muscle was present.  This was incised on its posterior aspect.  The nerve was then identified deep to this.  The forearm fascia was then  separated from the overlying subcutaneous tissue.  Two knee retractors placed.  The superficial fascia was then split under direct vision.  The muscle was then split with a small retractor.  The deep fascia was then cut using an angled ENT scissors and a KMI wrist guide taking care to protect the ulnar nerve distally.  This was done under direct vision. The attention was then turned proximally.  The subcutaneous tissue separated from the brachial fascia.  The [place] skid placed after placement of 2 knee retractors to visualize the fascia with protection to the ulnar nerve.  The fascia was then  split with the angled ENT forceps, scissors for approximately 6 cm proximal and distal.  The arm was then flexed.  No subluxation was noted.  The wound was copiously irrigated with saline.  The anconeus muscle was then sutured to the posterior skin flap with 2-0 Vicryl sutures.  The subcutaneous tissue was closed with interrupted 2-0 Vicryl and the skin with interrupted 4-0 Vicryl Rapide sutures.  Sterile compressive dressing was applied to each incision.  On deflation of the tourniquet, all fingers immediately pinked.  He was taken to the recovery room for observation in satisfactory condition. He will be discharged home to return to the [place] in 1 week on Percocet.          ______________________________ Daryll Brod, M.D.     GK/MEDQ  D:  10/21/2013  T:  10/22/2013  Job:  [id number]

## 2013-11-12 ENCOUNTER — Ambulatory Visit: Payer: Medicare Other | Admitting: Podiatry

## 2013-11-18 ENCOUNTER — Telehealth: Payer: Self-pay | Admitting: *Deleted

## 2013-11-18 NOTE — Telephone Encounter (Signed)
Pt states he needs to speak to someone concerning the form he sent.

## 2013-11-19 NOTE — Telephone Encounter (Signed)
Left message for patient to call.

## 2013-12-01 ENCOUNTER — Other Ambulatory Visit: Payer: Self-pay | Admitting: Orthopedic Surgery

## 2013-12-14 ENCOUNTER — Ambulatory Visit: Payer: Medicare Other | Admitting: Podiatry

## 2013-12-18 ENCOUNTER — Ambulatory Visit: Payer: Medicare Other | Admitting: *Deleted

## 2013-12-18 DIAGNOSIS — E1159 Type 2 diabetes mellitus with other circulatory complications: Secondary | ICD-10-CM

## 2013-12-18 NOTE — Progress Notes (Signed)
Pt presents for diabetic shoe measurements and molding.  Pt chose [employer] Black Double Strap [id number], measured 13.5 xxWide..  Pt was molded for inserts also.

## 2013-12-21 ENCOUNTER — Encounter: Payer: Self-pay | Admitting: Vascular Surgery

## 2013-12-22 ENCOUNTER — Ambulatory Visit: Payer: BC Managed Care – PPO | Admitting: Vascular Surgery

## 2013-12-22 ENCOUNTER — Other Ambulatory Visit (HOSPITAL_COMMUNITY): Payer: BC Managed Care – PPO

## 2013-12-31 ENCOUNTER — Encounter (HOSPITAL_BASED_OUTPATIENT_CLINIC_OR_DEPARTMENT_OTHER): Payer: Self-pay | Admitting: *Deleted

## 2014-01-06 ENCOUNTER — Ambulatory Visit (HOSPITAL_BASED_OUTPATIENT_CLINIC_OR_DEPARTMENT_OTHER)
Admission: RE | Admit: 2014-01-06 | Payer: BC Managed Care – PPO | Source: Ambulatory Visit | Admitting: Orthopedic Surgery

## 2014-01-06 ENCOUNTER — Encounter (HOSPITAL_BASED_OUTPATIENT_CLINIC_OR_DEPARTMENT_OTHER): Admission: RE | Payer: Self-pay | Source: Ambulatory Visit

## 2014-01-06 HISTORY — DX: Complete loss of teeth, unspecified cause, unspecified class: K08.109

## 2014-01-06 HISTORY — DX: Complete loss of teeth, unspecified cause, unspecified class: Z97.2

## 2014-01-06 HISTORY — DX: Presence of spectacles and contact lenses: Z97.3

## 2014-01-06 SURGERY — CARPAL TUNNEL RELEASE
Anesthesia: Choice | Site: Wrist | Laterality: Left

## 2014-02-08 ENCOUNTER — Encounter: Payer: Self-pay | Admitting: Family

## 2014-02-09 ENCOUNTER — Ambulatory Visit (INDEPENDENT_AMBULATORY_CARE_PROVIDER_SITE_OTHER): Payer: BC Managed Care – PPO | Admitting: Family

## 2014-02-09 ENCOUNTER — Ambulatory Visit (HOSPITAL_COMMUNITY)
Admission: RE | Admit: 2014-02-09 | Discharge: 2014-02-09 | Disposition: A | Discharge status: Home / Self Care | Payer: BC Managed Care – PPO | Source: Ambulatory Visit | Attending: Vascular Surgery | Admitting: Vascular Surgery

## 2014-02-09 ENCOUNTER — Encounter: Payer: Self-pay | Admitting: Family

## 2014-02-09 VITALS — BP 175/89 | HR 73 | Resp 16 | Ht 74.0 in | Wt 248.0 lb

## 2014-02-09 DIAGNOSIS — I6529 Occlusion and stenosis of unspecified carotid artery: Secondary | ICD-10-CM

## 2014-02-09 DIAGNOSIS — Z48812 Encounter for surgical aftercare following surgery on the circulatory system: Secondary | ICD-10-CM

## 2014-02-09 DIAGNOSIS — I6523 Occlusion and stenosis of bilateral carotid arteries: Secondary | ICD-10-CM

## 2014-02-09 NOTE — Patient Instructions (Signed)
Stroke Prevention Some medical conditions and behaviors are associated with an increased chance of having a stroke. You may prevent a stroke by making healthy choices and managing medical conditions. HOW CAN I REDUCE MY RISK OF HAVING A STROKE?   Stay physically active. Get at least 30 minutes of activity on most or all days.  Do not smoke. It may also be helpful to avoid exposure to secondhand smoke.  Limit alcohol use. Moderate alcohol use is considered to be:  No more than 2 drinks per day for men.  No more than 1 drink per day for nonpregnant women.  Eat healthy foods. This involves  Eating 5 or more servings of fruits and vegetables a day.  Following a diet that addresses high blood pressure (hypertension), high cholesterol, diabetes, or obesity.  Manage your cholesterol levels.  A diet low in saturated fat, trans fat, and cholesterol and high in fiber may control cholesterol levels.  Take any prescribed medicines to control cholesterol as directed by your health care provider.  Manage your diabetes.  A controlled-carbohydrate, controlled-sugar diet is recommended to manage diabetes.  Take any prescribed medicines to control diabetes as directed by your health care provider.  Control your hypertension.  A low-salt (sodium), low-saturated fat, low-trans fat, and low-cholesterol diet is recommended to manage hypertension.  Take any prescribed medicines to control hypertension as directed by your health care provider.  Maintain a healthy weight.  A reduced-calorie, low-sodium, low-saturated fat, low-trans fat, low-cholesterol diet is recommended to manage weight.  Stop drug abuse.  Avoid taking birth control pills.  Talk to your health care provider about the risks of taking birth control pills if you are over 35 years old, smoke, get migraines, or have ever had a blood clot.  Get evaluated for sleep disorders (sleep apnea).  Talk to your health care provider about  getting a sleep evaluation if you snore a lot or have excessive sleepiness.  Take medicines as directed by your health care provider.  For some people, aspirin or blood thinners (anticoagulants) are helpful in reducing the risk of forming abnormal blood clots that can lead to stroke. If you have the irregular heart rhythm of atrial fibrillation, you should be on a blood thinner unless there is a good reason you cannot take them.  Understand all your medicine instructions.  Make sure that other other conditions (such as anemia or atherosclerosis) are addressed. SEEK IMMEDIATE MEDICAL CARE IF:   You have sudden weakness or numbness of the face, arm, or leg, especially on one side of the body.  Your face or eyelid droops to one side.  You have sudden confusion.  You have trouble speaking (aphasia) or understanding.  You have sudden trouble seeing in one or both eyes.  You have sudden trouble walking.  You have dizziness.  You have a loss of balance or coordination.  You have a sudden, severe headache with no known cause.  You have new chest pain or an irregular heartbeat. Any of these symptoms may represent a serious problem that is an emergency. Do not wait to see if the symptoms will go away. Get medical help at once. Call your local emergency services  (911 in [place] Do not drive yourself to the hospital. Document Released: 10/04/2004 Document Revised: 06/17/2013 Document Reviewed: 02/27/2013 ExitCare Patient Information 2014 [employer].   Peripheral Vascular Disease Peripheral Vascular Disease (PVD), also called Peripheral Arterial Disease (PAD), is a circulation problem caused by cholesterol (atherosclerotic plaque) deposits in the   arteries. PVD commonly occurs in the lower extremities (legs) but it can occur in other areas of the body, such as your arms. The cholesterol buildup in the arteries reduces blood flow which can cause pain and other serious problems. The presence  of PVD can place a person at risk for Coronary Artery Disease (CAD).  CAUSES  Causes of PVD can be many. It is usually associated with more than one risk factor such as:   High Cholesterol.  Smoking.  Diabetes.  Lack of exercise or inactivity.  High blood pressure (hypertension).  Obesity.  Family history. SYMPTOMS   When the lower extremities are affected, patients with PVD may experience:  Leg pain with exertion or physical activity. This is called INTERMITTENT CLAUDICATION. This may present as cramping or numbness with physical activity. The location of the pain is associated with the level of blockage. For example, blockage at the abdominal level (distal abdominal aorta) may result in buttock or hip pain. Lower leg arterial blockage may result in calf pain.  As PVD becomes more severe, pain can develop with less physical activity.  In people with severe PVD, leg pain may occur at rest.  Other PVD signs and symptoms:  Leg numbness or weakness.  Coldness in the affected leg or foot, especially when compared to the other leg.  A change in leg color.  Patients with significant PVD are more prone to ulcers or sores on toes, feet or legs. These may take longer to heal or may reoccur. The ulcers or sores can become infected.  If signs and symptoms of PVD are ignored, gangrene may occur. This can result in the loss of toes or loss of an entire limb.  Not all leg pain is related to PVD. Other medical conditions can cause leg pain such as:  Blood clots (embolism) or Deep Vein Thrombosis.  Inflammation of the blood vessels (vasculitis).  Spinal stenosis. DIAGNOSIS  Diagnosis of PVD can involve several different types of tests. These can include:  Pulse Volume Recording Method (PVR). This test is simple, painless and does not involve the use of X-rays. PVR involves measuring and comparing the blood pressure in the arms and legs. An ABI (Ankle-Brachial Index) is calculated.  The normal ratio of blood pressures is 1. As this number becomes smaller, it indicates more severe disease.  < 0.95  indicates significant narrowing in one or more leg vessels.  <0.8 there will usually be pain in the foot, leg or buttock with exercise.  <0.4 will usually have pain in the legs at rest.  <0.25  usually indicates limb threatening PVD.  Doppler detection of pulses in the legs. This test is painless and checks to see if you have a pulses in your legs/feet.  A dye or contrast material (a substance that highlights the blood vessels so they show up on x-ray) may be given to help your caregiver better see the arteries for the following tests. The dye is eliminated from your body by the kidney's. Your caregiver may order blood work to check your kidney function and other laboratory values before the following tests are performed:  Magnetic Resonance Angiography (MRA). An MRA is a picture study of the blood vessels and arteries. The MRA machine uses a large magnet to produce images of the blood vessels.  Computed Tomography Angiography (CTA). A CTA is a specialized x-ray that looks at how the blood flows in your blood vessels. An IV may be inserted into your arm so contrast dye   can be injected.  Angiogram. Is a procedure that uses x-rays to look at your blood vessels. This procedure is minimally invasive, meaning a small incision (cut) is made in your groin. A small tube (catheter) is then inserted into the artery of your groin. The catheter is guided to the blood vessel or artery your caregiver wants to examine. Contrast dye is injected into the catheter. X-rays are then taken of the blood vessel or artery. After the images are obtained, the catheter is taken out. TREATMENT  Treatment of PVD involves many interventions which may include:  Lifestyle changes:  Quitting smoking.  Exercise.  Following a low fat, low cholesterol diet.  Control of diabetes.  Foot care is very  important to the PVD patient. Good foot care can help prevent infection.  Medication:  Cholesterol-lowering medicine.  Blood pressure medicine.  Anti-platelet drugs.  Certain medicines may reduce symptoms of Intermittent Claudication.  Interventional/Surgical options:  Angioplasty. An Angioplasty is a procedure that inflates a balloon in the blocked artery. This opens the blocked artery to improve blood flow.  Stent Implant. A wire mesh tube (stent) is placed in the artery. The stent expands and stays in place, allowing the artery to remain open.  Peripheral Bypass Surgery. This is a surgical procedure that reroutes the blood around a blocked artery to help improve blood flow. This type of procedure may be performed if Angioplasty or stent implants are not an option. SEEK IMMEDIATE MEDICAL CARE IF:   You develop pain or numbness in your arms or legs.  Your arm or leg turns cold, becomes blue in color.  You develop redness, warmth, swelling and pain in your arms or legs. MAKE SURE YOU:   Understand these instructions.  Will watch your condition.  Will get help right away if you are not doing well or get worse. Document Released: 10/04/2004 Document Revised: 11/19/2011 Document Reviewed: 08/31/2008 ExitCare Patient Information 2014 [employer].  

## 2014-02-09 NOTE — Progress Notes (Signed)
[facility] [place] HISTORY AND PHYSICAL   MRN : [id number]  History of Present Illness:   [name] is a 58 y.o. male patient of Dr. Kellie Simmering who is s/p left CEA on 11/10/2010, he returns today for follow up. Pt denies any history of stroke or TIA.  He also has severe PAD. He has 6 stents in his LE's. ESI's did not help his back issues, is considering nerve stimulator in his back; legs giving out, this apparently is attributed to his back issues. He also has DM peripheral neuropathy. Pt  states Dr.George Adams [phone]) at [facility] told pt that he now has good circulation in his legs after he had 2 stents placed in each lower leg in 2013, after his July, 2013 visit with Dr. Kellie Simmering,  at [facility].  Dr. Laurell Roof is his new cardiologist as Dr. Rollene Fare is no longer available to pt.  In July, 2013, patient with diabetes mellitus and severe tibial vessel occlusive disease. He had severe limiting claudication. He does have intact one-vessel runoff to the right foot and two-vessel runoff to the left foot.  No intervention or bypass is indicated in this diabetic patient who has in line flow in at least one of his tibial vessels bilaterally. I think any intervention would be more likely to cause harm and to help the situation. I discussed with patient and his wife the fact that limb salvage should be the primary issue not treatment of life altering claudication. Thighs to feet hurt, burn, are weak with walking about 100 feet, feet and legs hurt all the time, he wears a fentanyl patch to deal with the pain. Pt reports injury to right anterior tibial area last week, is slow to heal, is improving.  Pt Diabetic: Yes, states in good control with last A1C at 6.4 Pt smoker: former smoker, quit February, 2013  Current Outpatient Prescriptions  Medication Sig Dispense Refill  . ALPRAZolam (XANAX) 0.5 MG tablet Take 0.25 mg by mouth 3 (three) times daily.       Marland Kitchen  amLODipine-olmesartan (AZOR) 5-40 MG per tablet Take 1 tablet by mouth daily. 20  Mg One tab daily      . atorvastatin (LIPITOR) 40 MG tablet Take 40 mg by mouth daily.      . B Complex-C (B-COMPLEX WITH VITAMIN C) tablet Take 1 tablet by mouth daily.      . carvedilol (COREG) 25 MG tablet Take 12.5 mg by mouth every morning.       . clopidogrel (PLAVIX) 75 MG tablet Take 75 mg by mouth daily. 1 tab daily      . ezetimibe (ZETIA) 10 MG tablet Take 10 mg by mouth every morning.      . fentaNYL (DURAGESIC - DOSED MCG/HR) 50 MCG/HR Place 1 patch onto the skin every 3 (three) days.      . furosemide (LASIX) 80 MG tablet Take 80 mg by mouth daily.      Marland Kitchen glyBURIDE-metformin (GLUCOVANCE) 2.5-500 MG per tablet Take 2 tablets by mouth 2 (two) times daily.       Marland Kitchen HYDROmorphone (DILAUDID) 2 MG tablet Take 1 tablet (2 mg total) by mouth every 4 (four) hours as needed for pain.  30 tablet  0  . insulin glargine (LANTUS) 100 UNIT/ML injection Inject 50 Units into the skin at bedtime.       . insulin glulisine (APIDRA) 100 UNIT/ML injection Inject 10-20 Units into the skin 3 (three)  times daily before meals. 10 breakfast 10 lunch and 20 supper      . lansoprazole (PREVACID) 30 MG capsule Take 30 mg by mouth 2 (two) times daily.      . Multiple Vitamin (MULTIVITAMIN) tablet Take 1 tablet by mouth daily.      Marland Kitchen omega-3 acid ethyl esters (LOVAZA) 1 G capsule Take 2,000 mg by mouth 2 (two) times daily.       . ondansetron (ZOFRAN-ODT) 4 MG disintegrating tablet Take 4 mg by mouth 4 (four) times daily as needed for nausea.      Marland Kitchen oxyCODONE-acetaminophen (PERCOCET) 10-325 MG per tablet Take 1 tablet by mouth every 4 (four) hours as needed for pain.  30 tablet  0  . oxyCODONE-acetaminophen (PERCOCET/ROXICET) 5-325 MG per tablet Take 1 tablet by mouth every 8 (eight) hours as needed for pain.      . pregabalin (LYRICA) 75 MG capsule Take 75 mg by mouth 3 (three) times daily.       Marland Kitchen rOPINIRole (REQUIP) 2 MG tablet  Take 2 mg by mouth at bedtime.       . Tamsulosin HCl (FLOMAX) 0.4 MG CAPS Take 0.4 mg by mouth daily.       No current facility-administered medications for this visit.    Pt meds include: Statin :Yes Betablocker: Yes ASA: Yes Other anticoagulants/antiplatelets: Plavix  Past Medical History  Diagnosis Date  . Carotid artery occlusion 11/10/10    LEFT CAROTID ENDARTERECTOMY  . Diabetes mellitus   . Hypertension   . Hyperlipidemia   . COPD (chronic obstructive pulmonary disease)   . DJD (degenerative joint disease)   . GERD (gastroesophageal reflux disease)   . Substernal chest pain   . 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 Slurred speech     AS PER WIFE IN D/C NOTE 11/10/10  . PAD (peripheral artery disease)     Distal aortogram June 2012. Atherectomy left popliteal artery July 2012.   . Barrett's esophagus   . Fatty liver   . Diverticulosis of colon (without mention of hemorrhage)   . Emphysema of lung   . Osteomyelitis   . Complication of anesthesia     BP WENT UP AT [facility] "  . Sleep apnea     has not used CPAP in e years - took self off  . H/O hiatal hernia   . Neuromuscular disorder     peripheral neuropathy  . Full dentures   . Wears glasses     Past Surgical History  Procedure Laterality Date  . Laminectomy      X 3 LUMBAR AND X 2 CERVICAL SPINE OPERATIONS  . Carotid endarterectomy  11/10/10  . Hernia repair      LEFT INGUINAL AND UMBILICAL REPAIRS  . Anterior fusion cervical spine  02/06/06    C4-5, C5-6, C6-7; SURGEON DR. MAX COHEN  . Penile prosthesis implant  08/14/05    INFRAPUBIC INSERTION OF INFLATABLE PENILE PROSTHESIS; SURGEON DR. Amalia Hailey  . Laparoscopic cholecystectomy w/ cholangiography  11/09/04    SURGEON DR. Luella Cook  . Total knee arthroplasty  07/2002    RIGHT KNEE ; SURGEON  DR. [name] ALSO HAD ARTHROSCOPIC RIGHT KNEE IN  10/2001  . Shoulder arthroscopy    . Cholecystectomy    . Amputation  11/05/2011    Procedure:  AMPUTATION RAY;  Surgeon: Wylene Simmer, MD;  Location: [facility];  Service: Orthopedics;  Laterality: Right;  Amputation  of Right 4&5th Toes  . Femoral artery stent      x6  . Amputation Left 11/26/2012    Procedure: AMPUTATION RAY;  Surgeon: Wylene Simmer, MD;  Location: [facility];  Service: Orthopedics;  Laterality: Left;  fourth ray amputation  . Cardiac catheterization  10/31/04    2009  . Carpal tunnel release Right 10/21/2013    Procedure: RIGHT CARPAL TUNNEL RELEASE;  Surgeon: Wynonia Sours, MD;  Location: [facility];  Service: Orthopedics;  Laterality: Right;  . Ulnar nerve transposition Right 10/21/2013    Procedure: RIGHT ELBOW  ULNAR NERVE DECOMPRESSION;  Surgeon: Wynonia Sours, MD;  Location: [facility];  Service: Orthopedics;  Laterality: Right;  . Joint replacement Right 2001    Total  . Spine surgery      Social History History  Substance Use Topics  . Smoking status: Former Smoker -- 0.50 packs/day for 35 years    Types: Cigarettes    Quit date: 10/31/2011  . Smokeless tobacco: Never Used  . Alcohol Use: No    Family History Family History  Problem Relation Age of Onset  . Heart disease Father     Before age 24-  CAD, BPG  . Diabetes Father     Amputation  . Cancer Father     PROSTATE  . Hyperlipidemia Father   . Hypertension Father   . Heart attack Father     Triple BPG  . Arthritis      GRANDMOTHER  . Hypertension      OTHER FAMILY MEMBERS  . Colon cancer Brother   . Cancer Brother     Colon  . Diabetes Brother   . Heart disease Brother 67    A-Fib. Before age 10  . Hyperlipidemia Brother   . Hypertension Brother   . Cancer Sister     Breast    Allergies  Allergen Reactions  . Ivp Dye [Iodinated Diagnostic Agents] Anaphylaxis    Breathing problems  . Adhesive [Tape] Rash     REVIEW OF SYSTEMS: See HPI for pertinent positives and negatives.  Physical Examination Filed Vitals:   02/09/14 1259 02/09/14 1303  BP: 166/93  175/89  Pulse: 81 73  Resp:  16  Height:  6\' 2"  (1.88 m)  Weight:  248 lb (112.492 kg)  SpO2:  100%   Body mass index is 31.83 kg/(m^2).  General:  WDWN obese male in NAD Gait: Normal HENT: WNL Eyes: Pupils equal Pulmonary: normal non-labored breathing , without Rales, rhonchi,  wheezing Cardiac: RRR, no murmurs detected  Abdomen: soft, NT, no masses Skin: no rashes, ulcers noted;  no Gangrene , no cellulitis;  Open healing wound right anterior tibial area after injury;   VASCULAR EXAM  Carotid Bruits Left Right   Negative Negative                             VASCULAR EXAM: Extremities without ischemic changes  without Gangrene; ; without drainage.  LE Pulses LEFT RIGHT       FEMORAL   palpable   palpable        POPLITEAL  not palpable   nmot palpable       POSTERIOR TIBIAL  not palpable   not palpable        DORSALIS PEDIS      ANTERIOR TIBIAL not palpable  not palpable     Musculoskeletal: no muscle wasting or atrophy; 1+ bilateral pretibial pitting edema  Neurologic: A&O X 3; Appropriate Affect ;  MOTOR FUNCTION: 5/5 Symmetric, CN 2-12 intact Speech is fluent/normal   Non-Invasive Vascular Imaging (02/09/2014): CEREBROVASCULAR DUPLEX EVALUATION    INDICATION: Follow-up carotid artery disease     PREVIOUS INTERVENTION(S): Left carotid endarterectomy 11/10/2010    DUPLEX EXAM:     RIGHT  LEFT  Peak Systolic Velocities (cm/s) End Diastolic Velocities (cm/s) Plaque LOCATION Peak Systolic Velocities (cm/s) End Diastolic Velocities (cm/s) Plaque  180 15  CCA PROXIMAL 119 17   128 23  CCA MID 141 23   92 15 HT CCA DISTAL 149 20 HT  124 0 HT ECA 148 19 HT  77 22 HT ICA PROXIMAL 83 13 HT  77 25  ICA MID 90 28   103 30  ICA DISTAL 106 32     .84 ICA / CCA Ratio (PSV) NA  Antegrade  Vertebral Flow Antegrade   [id number] Brachial Systolic Pressure (mmHg) [id number]   Within normal limits  Brachial Artery Waveforms Within normal limits     Plaque Morphology:  HM = Homogeneous, HT = Heterogeneous, CP = Calcific Plaque, SP = Smooth Plaque, IP = Irregular Plaque     ADDITIONAL FINDINGS:     IMPRESSION: 1. Evidence of < 40% stenosis of the right internal carotid artery. 2. Patent left carotid endarterectomy with minimal restenosis. 3. Bilateral vertebral artery is antegrade.    Compared to the previous exam:  No significant change compared to prior exam.       ASSESSMENT:  [name] is a 58 y.o. male is s/p left CEA on 11/10/2010. Pt denies any history of stroke or TIA. Evidence of < 40% stenosis of the right internal carotid artery. Patent left carotid endarterectomy with minimal restenosis. Bilateral vertebral artery is antegrade. No significant change compared to prior exam.  Dr. Andree Elk, Vascular surgeon in the [place] area, is following pt's PAD after placing 2 stents in each lower leg in the latter part of 2013, after his July, 2013 visit with Dr. Kellie Simmering.  PLAN:   Based on today's exam and non-invasive vascular lab results, the patient will follow up in 12 month with the following tests carotid Duplex. I discussed in depth with the patient the nature of atherosclerosis, and emphasized the importance of maximal medical management including strict control of blood pressure, blood glucose, and lipid levels, obtaining regular exercise, and cessation of smoking.  The patient is aware that without maximal medical management the underlying atherosclerotic disease process will progress, limiting the benefit of any interventions. The patient was given information about stroke prevention and what symptoms should prompt the patient to seek immediate medical care.  The patient was given information about PAD including signs, symptoms, treatment, what symptoms should prompt the patient to seek immediate medical care, and risk reduction measures to  take. Thank you for allowing [place] to participate in this patient's care.  Clemon Chambers, RN, MSN, FNP-C [facility] Office: [phone]  Clinic MD: Kellie Simmering 02/09/2014 1:15 PM

## 2014-02-10 ENCOUNTER — Ambulatory Visit (INDEPENDENT_AMBULATORY_CARE_PROVIDER_SITE_OTHER): Payer: Medicare Other | Admitting: Podiatry

## 2014-02-10 DIAGNOSIS — E1159 Type 2 diabetes mellitus with other circulatory complications: Secondary | ICD-10-CM | POA: Diagnosis not present

## 2014-02-10 DIAGNOSIS — M2040 Other hammer toe(s) (acquired), unspecified foot: Secondary | ICD-10-CM

## 2014-02-10 NOTE — Progress Notes (Signed)
Dispensed diabetic shoes and 3 pairs of insoles with instructions for wearing.  

## 2014-02-10 NOTE — Progress Notes (Signed)
Subjective:     Patient ID: [name], male   DOB: 09-Jul-1956, 58 y.o.   MRN: [id number]  HPI patient presents to pick up diabetic shoes   Review of Systems     Objective:   Physical Exam Neurovascular unchanged with significant at risk condition secondary to long-term diabetes    Assessment:     At wrist diabetic who requires diabetic shoes    Plan:     Shoes were fitted well was given instructions on usage and will be seen back for his to recheck at his convenience along with customized insoles which she will wear for the next one year

## 2014-02-10 NOTE — Patient Instructions (Signed)
Carefully review instructions given for wearing your diabetic shoes and inserts.

## 2014-04-27 ENCOUNTER — Ambulatory Visit: Payer: BC Managed Care – PPO | Admitting: Internal Medicine

## 2014-06-29 ENCOUNTER — Encounter: Payer: Self-pay | Admitting: Internal Medicine

## 2014-06-29 ENCOUNTER — Ambulatory Visit (INDEPENDENT_AMBULATORY_CARE_PROVIDER_SITE_OTHER): Payer: BC Managed Care – PPO | Admitting: Internal Medicine

## 2014-06-29 VITALS — BP 156/66 | HR 76 | Ht 73.0 in | Wt 245.4 lb

## 2014-06-29 DIAGNOSIS — K219 Gastro-esophageal reflux disease without esophagitis: Secondary | ICD-10-CM

## 2014-06-29 DIAGNOSIS — K625 Hemorrhage of anus and rectum: Secondary | ICD-10-CM

## 2014-06-29 DIAGNOSIS — K5900 Constipation, unspecified: Secondary | ICD-10-CM

## 2014-06-29 DIAGNOSIS — R194 Change in bowel habit: Secondary | ICD-10-CM

## 2014-06-29 DIAGNOSIS — Z7902 Long term (current) use of antithrombotics/antiplatelets: Secondary | ICD-10-CM

## 2014-06-29 DIAGNOSIS — I6529 Occlusion and stenosis of unspecified carotid artery: Secondary | ICD-10-CM

## 2014-06-29 MED ORDER — LINACLOTIDE 145 MCG PO CAPS: 145.0000 ug | ORAL_CAPSULE | Freq: Every day | ORAL | Status: DC

## 2014-06-29 MED ORDER — MOVIPREP 100 G PO SOLR: 1.0000 | Freq: Once | ORAL | Status: DC

## 2014-06-29 NOTE — Patient Instructions (Addendum)
You have been scheduled for a colonoscopy. Please follow written instructions given to you at your visit today.  Please pick up your prep kit at the pharmacy within the next 1-3 days. If you use inhalers (even only as needed), please bring them with you on the day of your procedure. Your physician has requested that you go to www.startemmi.com and enter the access code given to you at your visit today. This web site gives a general overview about your procedure. However, you should still follow specific instructions given to you by our office regarding your preparation for the procedure.  

## 2014-06-29 NOTE — Progress Notes (Signed)
Patient ID: [name], male   DOB: 1956/06/15, 58 y.o.   MRN: [id number] HPI: Ireland Vansomeren is a 58 yo male with PMH of GERD with remote Barrett's felt to have resolved on PPI, COPD, hypertension, hyperlipidemia, diabetes, peripheral vascular disease who is seen at request of Dr. Wilson Singer to evaluate 8-9 months of issues with incomplete evacuation. He reports that he has a history of loose stools and now has noticed more constipation. He has difficult time passing his stool reports needing to strain. He has a time seen blood in his stool. He denies melena. He is taking Plavix. He has history of reflux but this is well-controlled on Prevacid 30 mg twice daily. He denies dysphagia or diet dysphagia. He denies abdominal pain, no vomiting. He does have a history of nausea and he uses Zofran to help control nausea. This is long-standing in nature. Denies hepatobiliary complaint. He reports having a previous colonoscopy 8 or 9 years ago at Dr. Lajoyce Corners. He does not recall history of colon polyps, though records indicate hyperplastic rectal polyp from 2006. Family history notable for a brother with colorectal cancer  Past Medical History  Diagnosis Date  . Carotid artery occlusion 11/10/10    LEFT CAROTID ENDARTERECTOMY  . Diabetes mellitus   . Hypertension   . Hyperlipidemia   . COPD (chronic obstructive pulmonary disease)   . DJD (degenerative joint disease)   . GERD (gastroesophageal reflux disease)   . Substernal chest pain   . 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 Slurred speech     AS PER WIFE IN D/C NOTE 11/10/10  . PAD (peripheral artery disease)     Distal aortogram June 2012. Atherectomy left popliteal artery July 2012.   . Barrett's esophagus   . Fatty liver   . Diverticulosis of colon (without mention of hemorrhage)   . Emphysema of lung   . Osteomyelitis   . Complication of anesthesia     BP WENT UP AT [facility] "  . Sleep apnea     has not used CPAP in e years - took self  off  . H/O hiatal hernia   . Neuromuscular disorder     peripheral neuropathy  . Full dentures   . Wears glasses     Past Surgical History  Procedure Laterality Date  . Laminectomy      X 3 LUMBAR AND X 2 CERVICAL SPINE OPERATIONS  . Carotid endarterectomy  11/10/10  . Hernia repair      LEFT INGUINAL AND UMBILICAL REPAIRS  . Anterior fusion cervical spine  02/06/06    C4-5, C5-6, C6-7; SURGEON DR. MAX COHEN  . Penile prosthesis implant  08/14/05    INFRAPUBIC INSERTION OF INFLATABLE PENILE PROSTHESIS; SURGEON DR. Amalia Hailey  . Laparoscopic cholecystectomy w/ cholangiography  11/09/04    SURGEON DR. Luella Cook  . Total knee arthroplasty  07/2002    RIGHT KNEE ; SURGEON  DR. [name] ALSO HAD ARTHROSCOPIC RIGHT KNEE IN  10/2001  . Shoulder arthroscopy    . Cholecystectomy    . Amputation  11/05/2011    Procedure: AMPUTATION RAY;  Surgeon: Wylene Simmer, MD;  Location: [place];  Service: Orthopedics;  Laterality: Right;  Amputation of Right 4&5th Toes  . Femoral artery stent      x6  . Amputation Left 11/26/2012    Procedure: AMPUTATION RAY;  Surgeon: Wylene Simmer, MD;  Location: [place];  Service: Orthopedics;  Laterality: Left;  fourth ray amputation  . Cardiac catheterization  10/31/04    2009  . Carpal tunnel release Right 10/21/2013    Procedure: RIGHT CARPAL TUNNEL RELEASE;  Surgeon: Wynonia Sours, MD;  Location: [place];  Service: Orthopedics;  Laterality: Right;  . Ulnar nerve transposition Right 10/21/2013    Procedure: RIGHT ELBOW  ULNAR NERVE DECOMPRESSION;  Surgeon: Wynonia Sours, MD;  Location: [place];  Service: Orthopedics;  Laterality: Right;  . Joint replacement Right 2001    Total  . Spine surgery    . Neck surgery      Outpatient Prescriptions Prior to Visit  Medication Sig Dispense Refill  . ALPRAZolam (XANAX) 0.5 MG tablet Take 0.25 mg by mouth 3 (three) times daily.       Marland Kitchen aspirin EC 81 MG tablet Take 81 mg by mouth daily.      Marland Kitchen  atorvastatin (LIPITOR) 40 MG tablet Take 40 mg by mouth daily.      . B Complex-C (B-COMPLEX WITH VITAMIN C) tablet Take 1 tablet by mouth daily.      . carvedilol (COREG) 25 MG tablet Take 12.5 mg by mouth every morning.       . clopidogrel (PLAVIX) 75 MG tablet Take 75 mg by mouth daily. 1 tab daily      . ezetimibe (ZETIA) 10 MG tablet Take 10 mg by mouth every morning.      . fentaNYL (DURAGESIC - DOSED MCG/HR) 50 MCG/HR Place 1 patch onto the skin every 3 (three) days.      . furosemide (LASIX) 80 MG tablet Take 80 mg by mouth daily.      Marland Kitchen glyBURIDE-metformin (GLUCOVANCE) 2.5-500 MG per tablet Take 2 tablets by mouth 2 (two) times daily.       Marland Kitchen HYDROmorphone (DILAUDID) 2 MG tablet Take 1 tablet (2 mg total) by mouth every 4 (four) hours as needed for pain.  30 tablet  0  . insulin glargine (LANTUS) 100 UNIT/ML injection Inject 50 Units into the skin at bedtime.       . insulin glulisine (APIDRA) 100 UNIT/ML injection Inject 10-20 Units into the skin 3 (three) times daily before meals. 10 breakfast 10 lunch and 20 supper      . lansoprazole (PREVACID) 30 MG capsule Take 30 mg by mouth 2 (two) times daily.      . Multiple Vitamin (MULTIVITAMIN) tablet Take 1 tablet by mouth daily.      Marland Kitchen omega-3 acid ethyl esters (LOVAZA) 1 G capsule Take 2,000 mg by mouth 2 (two) times daily.       . ondansetron (ZOFRAN-ODT) 4 MG disintegrating tablet Take 4 mg by mouth 4 (four) times daily as needed for nausea.      Marland Kitchen oxyCODONE-acetaminophen (PERCOCET/ROXICET) 5-325 MG per tablet Take 1 tablet by mouth every 8 (eight) hours as needed for pain.      . pregabalin (LYRICA) 75 MG capsule Take 75 mg by mouth 3 (three) times daily.       Marland Kitchen rOPINIRole (REQUIP) 2 MG tablet Take 2 mg by mouth at bedtime.       . Tamsulosin HCl (FLOMAX) 0.4 MG CAPS Take 0.4 mg by mouth daily.      Marland Kitchen amLODipine-olmesartan (AZOR) 5-40 MG per tablet Take 1 tablet by mouth daily. 20  Mg One tab daily      . oxyCODONE-acetaminophen  (PERCOCET) 10-325 MG per tablet Take 1 tablet by mouth every 4 (four) hours  as needed for pain.  30 tablet  0   No facility-administered medications prior to visit.    Allergies  Allergen Reactions  . Ivp Dye [Iodinated Diagnostic Agents] Anaphylaxis    Breathing problems  . Adhesive [Tape] Rash    Family History  Problem Relation Age of Onset  . Heart disease Father     Before age 46-  CAD, BPG  . Diabetes Father     Amputation  . Cancer Father     PROSTATE  . Hyperlipidemia Father   . Hypertension Father   . Heart attack Father     Triple BPG  . Arthritis      GRANDMOTHER  . Hypertension      OTHER FAMILY MEMBERS  . Colon cancer Brother   . Cancer Brother     Colon  . Diabetes Brother   . Heart disease Brother 5    A-Fib. Before age 56  . Hyperlipidemia Brother   . Hypertension Brother   . Cancer Sister     Breast    History  Substance Use Topics  . Smoking status: Former Smoker -- 0.50 packs/day for 35 years    Types: Cigarettes    Quit date: 10/31/2011  . Smokeless tobacco: Never Used  . Alcohol Use: No    ROS: As per history of present illness, otherwise negative  BP 156/66  Pulse 76  Ht 6\' 1"  (1.854 m)  Wt 245 lb 6 oz (111.301 kg)  BMI 32.38 kg/m2 Constitutional: Well-developed and well-nourished. No distress. HEENT: Normocephalic and atraumatic. Oropharynx is clear and moist. No oropharyngeal exudate. Conjunctivae are normal.  No scleral icterus. Neck: Neck supple. Trachea midline. Cardiovascular: Normal rate, regular rhythm and intact distal pulses.  Pulmonary/chest: Effort normal and breath sounds normal. No wheezing, rales or rhonchi. Abdominal: Soft, nontender, nondistended. Bowel sounds active throughout.  Extremities: no clubbing, cyanosis, or edema. Neurological: Alert and oriented to person place and time. Psychiatric: Normal mood and affect. Behavior is normal.  RELEVANT LABS AND IMAGING: CBC    Component Value Date/Time   WBC  6.5 05/27/2013 2014   RBC 3.95* 05/27/2013 2014   HGB 12.4* 10/21/2013 1025   HCT 33.1* 05/27/2013 2014   PLT 205 05/27/2013 2014   MCV 83.8 05/27/2013 2014   MCH 30.9 05/27/2013 2014   MCHC 36.9* 05/27/2013 2014   RDW 12.4 05/27/2013 2014   LYMPHSABS 1.3 05/27/2013 2014   MONOABS 0.6 05/27/2013 2014   EOSABS 0.1 05/27/2013 2014   BASOSABS 0.0 05/27/2013 2014    CMP     Component Value Date/Time   NA 141 10/16/2013 1637   K 4.2 10/16/2013 1637   CL 99 10/16/2013 1637   CO2 26 10/16/2013 1637   GLUCOSE 129* 10/16/2013 1637   BUN 16 10/16/2013 1637   CREATININE 0.94 10/16/2013 1637   CREATININE 1.00 02/01/2012 1028   CALCIUM 8.8 10/16/2013 1637   PROT 6.4 11/24/2012 0610   ALBUMIN 3.3* 11/24/2012 0610   AST 9 11/24/2012 0610   ALT 9 11/24/2012 0610   ALKPHOS 122* 11/24/2012 0610   BILITOT 0.3 11/24/2012 0610   GFRNONAA 90* 10/16/2013 1637   GFRNONAA 83 02/01/2012 1028   GFRAA >90 10/16/2013 1637   GFRAA >89 02/01/2012 1028   Labs received dated 01/27/2014 -- hemoglobin 11.7, MCV 87.2, WBC 5.8, platelet count 180  ASSESSMENT/PLAN: 58 yo male with PMH of GERD with remote Barrett's felt to have resolved on PPI, COPD, hypertension, hyperlipidemia, diabetes, peripheral vascular disease who is  seen at request of Dr. Wilson Singer to evaluate 8-9 months of issues with incomplete evacuation.  1. Change in bowel habits/constipation/rectal bleeding/incomplete evacuation -- for all these reasons I recommend repeating colonoscopy. We discussed this test including the risks and benefits and he is agreeable to proceed. His Plavix will need to be held 5 days before this procedure and we will seek permission to hold this medication from Dr. Wilson Singer.  Assuming permission granted, we will proceed to colonoscopy. He does have a mild, normocytic anemia.  2. GERD with remote Barrett's -- he has had 3 endoscopies with negative Barrett's by biopsy and therefore I do feel his Barrett's has resolved. GERD well controlled, continue Prevacid at  current dose. Given the lack of Barrett's metaplasia on 3 subsequent endoscopies, screening will be discontinued. Upper endoscopy at this point not felt indicated given lack of alarm symptom.

## 2014-06-30 ENCOUNTER — Telehealth: Payer: Self-pay | Admitting: Internal Medicine

## 2014-06-30 ENCOUNTER — Telehealth: Payer: Self-pay

## 2014-06-30 NOTE — Telephone Encounter (Signed)
Pt states Dr. Hilarie Fredrickson asked him the other day if he was having problems swallowing and he said no. He has been thinking about it and realized that he is having issues. States it happened today and he had a choking sensation. Pt wanted to call back and let Dr. Hilarie Fredrickson know. Please advise.

## 2014-06-30 NOTE — Telephone Encounter (Signed)
Would start with barium swallow with tablet, may need EGD depending on results.

## 2014-06-30 NOTE — Telephone Encounter (Signed)
  06/30/2014   RE: [name] DOB: 01/12/1956 MRN: [id number]   Dear Dr. Wilson Singer,    We have scheduled the above patient for an endoscopic procedure. Our records show that he is on anticoagulation therapy.   Please advise as to how long the patient may come off his therapy of Plavix prior to the procedure, which is scheduled for 08-09-2014.  Please fax back/ or route the completed form to [place] at [phone].   Sincerely,    Phillis Haggis

## 2014-07-01 ENCOUNTER — Other Ambulatory Visit: Payer: Self-pay

## 2014-07-01 DIAGNOSIS — R1310 Dysphagia, unspecified: Secondary | ICD-10-CM

## 2014-07-01 NOTE — Telephone Encounter (Signed)
Pt aware and scheduled for barium swallow at [facility] 07/05/14@9 :30am, pt to arrive at 9:15am and be NPO after midnight. Pt aware of appt.

## 2014-07-05 ENCOUNTER — Ambulatory Visit (HOSPITAL_COMMUNITY)
Admission: RE | Admit: 2014-07-05 | Discharge: 2014-07-05 | Disposition: A | Discharge status: Home / Self Care | Payer: BC Managed Care – PPO | Source: Ambulatory Visit | Attending: Internal Medicine | Admitting: Internal Medicine

## 2014-07-05 DIAGNOSIS — R1310 Dysphagia, unspecified: Secondary | ICD-10-CM | POA: Insufficient documentation

## 2014-07-08 ENCOUNTER — Telehealth: Payer: Self-pay

## 2014-07-08 NOTE — Telephone Encounter (Signed)
Please add EGD on to his colonoscopy appointment Anticoagulant will need to be held with permission as for colonoscopy Also arrange for outpatient high resolution esophageal manometry at [facility]

## 2014-07-08 NOTE — Telephone Encounter (Signed)
I have left message for the patient to call back 

## 2014-07-08 NOTE — Telephone Encounter (Signed)
[name] calls back for his results from the barium swallow. He reports his symptoms are unchanged. Asks if he should come back in to discuss?

## 2014-07-09 ENCOUNTER — Other Ambulatory Visit: Payer: Self-pay

## 2014-07-09 DIAGNOSIS — K2270 Barrett's esophagus without dysplasia: Secondary | ICD-10-CM

## 2014-07-09 NOTE — Telephone Encounter (Signed)
Manometry scheduled at [place] for 07/19/14 at 10:30. Patient is advised.

## 2014-07-15 NOTE — Telephone Encounter (Signed)
Faxed anticoag letter to Dr. Wilson Singer for 2nd time.  Will call if no response in the next day.

## 2014-07-19 ENCOUNTER — Encounter (HOSPITAL_COMMUNITY)
Admission: RE | Disposition: A | Discharge status: Home / Self Care | Payer: Self-pay | Source: Ambulatory Visit | Attending: Internal Medicine

## 2014-07-19 ENCOUNTER — Ambulatory Visit (HOSPITAL_COMMUNITY)
Admission: RE | Admit: 2014-07-19 | Discharge: 2014-07-19 | Disposition: A | Discharge status: Home / Self Care | Payer: BC Managed Care – PPO | Source: Ambulatory Visit | Attending: Internal Medicine | Admitting: Internal Medicine

## 2014-07-19 DIAGNOSIS — K2270 Barrett's esophagus without dysplasia: Secondary | ICD-10-CM | POA: Diagnosis not present

## 2014-07-19 DIAGNOSIS — R1310 Dysphagia, unspecified: Secondary | ICD-10-CM | POA: Diagnosis not present

## 2014-07-19 HISTORY — PX: ESOPHAGEAL MANOMETRY: SHX5429

## 2014-07-19 SURGERY — MANOMETRY, ESOPHAGUS
Anesthesia: LOCAL | Laterality: Bilateral

## 2014-07-19 MED ORDER — LIDOCAINE VISCOUS 2 % MT SOLN
OROMUCOSAL | Status: AC
  Filled 2014-07-19: qty 15

## 2014-07-19 SURGICAL SUPPLY — 2 items
FACESHIELD LNG OPTICON STERILE (SAFETY) IMPLANT
GLOVE BIO SURGEON STRL SZ8 (GLOVE) ×4 IMPLANT

## 2014-07-20 ENCOUNTER — Encounter (HOSPITAL_COMMUNITY): Payer: Self-pay | Admitting: Internal Medicine

## 2014-07-20 NOTE — Telephone Encounter (Signed)
Communicated to patient that Dr. Wilson Singer has instructed him to hold his Plavix for 5 days prior to his procedure.  Patient acknowledged and understood

## 2014-08-09 ENCOUNTER — Encounter: Payer: Self-pay | Admitting: Internal Medicine

## 2014-08-09 ENCOUNTER — Ambulatory Visit (AMBULATORY_SURGERY_CENTER): Payer: BC Managed Care – PPO | Admitting: Internal Medicine

## 2014-08-09 ENCOUNTER — Other Ambulatory Visit: Payer: Self-pay | Admitting: Internal Medicine

## 2014-08-09 VITALS — BP 111/68 | HR 54 | Temp 97.3°F | Resp 10 | Ht 73.0 in | Wt 245.0 lb

## 2014-08-09 DIAGNOSIS — D123 Benign neoplasm of transverse colon: Secondary | ICD-10-CM

## 2014-08-09 DIAGNOSIS — R1319 Other dysphagia: Secondary | ICD-10-CM

## 2014-08-09 DIAGNOSIS — K224 Dyskinesia of esophagus: Secondary | ICD-10-CM

## 2014-08-09 DIAGNOSIS — R1314 Dysphagia, pharyngoesophageal phase: Secondary | ICD-10-CM

## 2014-08-09 DIAGNOSIS — K219 Gastro-esophageal reflux disease without esophagitis: Secondary | ICD-10-CM

## 2014-08-09 DIAGNOSIS — R194 Change in bowel habit: Secondary | ICD-10-CM

## 2014-08-09 DIAGNOSIS — R1310 Dysphagia, unspecified: Secondary | ICD-10-CM

## 2014-08-09 DIAGNOSIS — K5900 Constipation, unspecified: Secondary | ICD-10-CM

## 2014-08-09 DIAGNOSIS — Z800 Family history of malignant neoplasm of digestive organs: Secondary | ICD-10-CM

## 2014-08-09 LAB — GLUCOSE, CAPILLARY
Glucose-Capillary: 136 mg/dL — ABNORMAL HIGH (ref 70–99)
Glucose-Capillary: 135 mg/dL — ABNORMAL HIGH (ref 70–99)

## 2014-08-09 MED ORDER — NITROGLYCERIN 0.4 MG SL SUBL: 0.4000 mg | SUBLINGUAL_TABLET | SUBLINGUAL | Status: DC | PRN

## 2014-08-09 MED ORDER — SODIUM CHLORIDE 0.9 % IV SOLN: 500.0000 mL | INTRAVENOUS | Status: DC

## 2014-08-09 MED ORDER — LINACLOTIDE 145 MCG PO CAPS
145.0000 ug | ORAL_CAPSULE | Freq: Every day | ORAL | Status: DC
Start: 2014-08-09 — End: 2014-11-22

## 2014-08-09 NOTE — Op Note (Signed)
[place]  [address] [place], [address]   COLONOSCOPY PROCEDURE REPORT  PATIENT: [name], [name]  MR#: [id number] BIRTHDATE: 1956-04-23 , 58  yrs. old GENDER: male ENDOSCOPIST: Jerene Bears, MD PROCEDURE DATE:  08/09/2014 PROCEDURE:   Colonoscopy with snare polypectomy First Screening Colonoscopy - Avg.  risk and is 50 yrs.  old or older - No.  Prior Negative Screening - Now for repeat screening. N/A  History of Adenoma - Now for follow-up colonoscopy & has been > or = to 3 yrs.  N/A  Polyps Removed Today? Yes. ASA CLASS:   Class III INDICATIONS:change in bowel habits, constipation, incomplete evacuation, and patient's immediate family history of colon cancer.  MEDICATIONS: Monitored anesthesia care and Propofol 100 mg IV  DESCRIPTION OF PROCEDURE:   After the risks benefits and alternatives of the procedure were thoroughly explained, informed consent was obtained.  The digital rectal exam revealed no rectal mass.   The LB [id number]  endoscope was introduced through the anus and advanced to the cecum, which was identified by both the appendix and ileocecal valve. No adverse events experienced. The quality of the prep was good, using MoviPrep  The instrument was then slowly withdrawn as the colon was fully examined.     COLON FINDINGS: A sessile polyp measuring 5 mm in size was found in the transverse colon.  A polypectomy was performed with a cold snare.  The resection was complete, the polyp tissue was completely retrieved and sent to histology.   There was moderate diverticulosis noted in the descending colon and sigmoid colon. Retroflexed views revealed no abnormalities. The time to cecum=3 minutes 07 seconds.  Withdrawal time=11 minutes 48 seconds.  The scope was withdrawn and the procedure completed. COMPLICATIONS: There were no immediate complications.  ENDOSCOPIC IMPRESSION: 1.   Sessile polyp was found in the transverse colon;  polypectomy was performed with a cold snare 2.   Moderate diverticulosis was noted in the descending colon and sigmoid colon  RECOMMENDATIONS: 1.  Await pathology results 2.  High fiber diet 3.  Repeat Colonoscopy in 5 years.  eSigned:  Jerene Bears, MD 2014-08-09 16:11:01.200   cc: The Patient; Anda Kraft, MD

## 2014-08-09 NOTE — Op Note (Signed)
[place]  [address]. [place], [address]   ENDOSCOPY PROCEDURE REPORT  PATIENT: Harold, [name]  MR#: [id number] BIRTHDATE: Jul 05, 1956 , 58  yrs. old GENDER: male ENDOSCOPIST: Jerene Bears, MD PROCEDURE DATE:  08/09/2014 PROCEDURE:  EGD, diagnostic ASA CLASS:     Class III INDICATIONS:  dysphagia.  atypical chest pain.  sensation of food in esophagus MEDICATIONS: Monitored anesthesia care and Propofol 100 mg IV TOPICAL ANESTHETIC: none  DESCRIPTION OF PROCEDURE: After the risks benefits and alternatives of the procedure were thoroughly explained, informed consent was obtained.  The LB [id number] endoscope was introduced through the mouth and advanced to the second portion of the duodenum , Without limitations.  The instrument was slowly withdrawn as the mucosa was fully examined.    EXAM: The esophagus and gastroesophageal junction were completely normal in appearance.  The stomach was entered and closely examined.The antrum, angularis, and lesser curvature were well visualized, including a retroflexed view of the cardia and fundus. The stomach wall was normally distensible.  The scope passed easily through the pylorus into the duodenum.   No evidence of Barrett's esophagus in the esophagus.  Retroflexed views revealed no abnormalities.     The scope was then withdrawn from the patient and the procedure completed.  COMPLICATIONS: There were no immediate complications.  ENDOSCOPIC IMPRESSION: 1.   Normal EGD 2.   No evidence of Barrett's esophagus in the esophagus  RECOMMENDATIONS: 1.  If atypical chest pain continues consider trial of calcium channel blocker for esophageal spasm 2.  Continue current medications   eSigned:  Jerene Bears, MD 2014-08-09 [id number]    CC:The Patient and Anda Kraft, MD

## 2014-08-09 NOTE — Patient Instructions (Signed)
Discharge instructions given. Handouts on polyps and diverticulosis. Resume previous medications. YOU HAD AN ENDOSCOPIC PROCEDURE TODAY AT [facility]: Refer to the procedure report that was given to you for any specific questions about what was found during the examination.  If the procedure report does not answer your questions, please call your gastroenterologist to clarify.  If you requested that your care partner not be given the details of your procedure findings, then the procedure report has been included in a sealed envelope for you to review at your convenience later.  YOU SHOULD EXPECT: Some feelings of bloating in the abdomen. Passage of more gas than usual.  Walking can help get rid of the air that was put into your GI tract during the procedure and reduce the bloating. If you had a lower endoscopy (such as a colonoscopy or flexible sigmoidoscopy) you may notice spotting of blood in your stool or on the toilet paper. If you underwent a bowel prep for your procedure, then you may not have a normal bowel movement for a few days.  DIET: Your first meal following the procedure should be a light meal and then it is ok to progress to your normal diet.  A half-sandwich or bowl of soup is an example of a good first meal.  Heavy or fried foods are harder to digest and may make you feel nauseous or bloated.  Likewise meals heavy in dairy and vegetables can cause extra gas to form and this can also increase the bloating.  Drink plenty of fluids but you should avoid alcoholic beverages for 24 hours.  ACTIVITY: Your care partner should take you home directly after the procedure.  You should plan to take it easy, moving slowly for the rest of the day.  You can resume normal activity the day after the procedure however you should NOT DRIVE or use heavy machinery for 24 hours (because of the sedation medicines used during the test).    SYMPTOMS TO REPORT IMMEDIATELY: A gastroenterologist  can be reached at any hour.  During normal business hours, 8:30 AM to 5:00 PM Monday through Friday, call [phone].  After hours and on weekends, please call the GI answering service at [phone] who will take a message and have the physician on call contact you.   Following lower endoscopy (colonoscopy or flexible sigmoidoscopy):  Excessive amounts of blood in the stool  Significant tenderness or worsening of abdominal pains  Swelling of the abdomen that is new, acute  Fever of 100F or higher  Following upper endoscopy (EGD)  Vomiting of blood or coffee ground material  New chest pain or pain under the shoulder blades  Painful or persistently difficult swallowing  New shortness of breath  Fever of 100F or higher  Black, tarry-looking stools  FOLLOW UP: If any biopsies were taken you will be contacted by phone or by letter within the next 1-3 weeks.  Call your gastroenterologist if you have not heard about the biopsies in 3 weeks.  Our staff will call the home number listed on your records the next business day following your procedure to check on you and address any questions or concerns that you may have at that time regarding the information given to you following your procedure. This is a courtesy call and so if there is no answer at the home number and we have not heard from you through the emergency physician on call, we will assume that you have returned to your  regular daily activities without incident.  SIGNATURES/CONFIDENTIALITY: You and/or your care partner have signed paperwork which will be entered into your electronic medical record.  These signatures attest to the fact that that the information above on your After Visit Summary has been reviewed and is understood.  Full responsibility of the confidentiality of this discharge information lies with you and/or your care-partner.

## 2014-08-09 NOTE — Progress Notes (Signed)
Report to PACU, RN, vss, BBS= Clear.  

## 2014-08-09 NOTE — Progress Notes (Signed)
Called to room to assist during endoscopic procedure.  Patient ID and intended procedure confirmed with present staff. Received instructions for my participation in the procedure from the performing physician.  

## 2014-08-10 ENCOUNTER — Telehealth: Payer: Self-pay | Admitting: *Deleted

## 2014-08-10 NOTE — Telephone Encounter (Signed)
  Follow up Call-  Call back number 08/09/2014  Post procedure Call Back phone  # [phone]- WIFE CELL NUMBER MAY TALK TO HER  Permission to leave phone message Yes     Patient questions:  Do you have a fever, pain , or abdominal swelling? No. Pain Score  0 *  Have you tolerated food without any problems? Yes.    Have you been able to return to your normal activities? No.  Do you have any questions about your discharge instructions: Diet   No. Medications  No. Follow up visit  No.  Do you have questions or concerns about your Care? No.  Actions: * If pain score is 4 or above: No action needed, pain <4.

## 2014-08-13 ENCOUNTER — Telehealth: Payer: Self-pay

## 2014-08-13 NOTE — Telephone Encounter (Signed)
-----   Message from Jerene Bears, MD sent at 08/04/2014 10:17 AM EST ----- Regarding: Esophageal mano Mano performed for dysphagia --Intact esophageal peristalsis (the esophagus squeezes normally), but the bolus clearance was incomplete (when he swallows the bolus does not empty from the esophagus in a normal fashion). This is nonspecific esophageal dysmotility, not consistent with achalasia which would require further treatment Unlikely dilation would be helpful, but if symptoms continue to persist and are very troublesome, then upper endoscopy can be arranged. Anticoagulation will need to be held with permission

## 2014-08-13 NOTE — Telephone Encounter (Signed)
Pt aware of results. Pt states that he has continued to have problems with abdominal pain and cramping and now he is having vomiting. Pt has questions regarding results. Pt scheduled to see Dr. Hilarie Fredrickson 08/17/14@3 :15pm. Pt aware of appt.

## 2014-08-16 ENCOUNTER — Encounter: Payer: Self-pay | Admitting: Internal Medicine

## 2014-08-17 ENCOUNTER — Ambulatory Visit: Payer: BC Managed Care – PPO | Admitting: Internal Medicine

## 2014-08-17 ENCOUNTER — Emergency Department (HOSPITAL_COMMUNITY)
Admission: EM | Admit: 2014-08-17 | Discharge: 2014-08-17 | Disposition: A | Discharge status: Home / Self Care | Payer: BC Managed Care – PPO | Attending: Emergency Medicine | Admitting: Emergency Medicine

## 2014-08-17 ENCOUNTER — Emergency Department (HOSPITAL_COMMUNITY): Payer: BC Managed Care – PPO

## 2014-08-17 ENCOUNTER — Encounter (HOSPITAL_COMMUNITY): Payer: Self-pay | Admitting: *Deleted

## 2014-08-17 DIAGNOSIS — Z79891 Long term (current) use of opiate analgesic: Secondary | ICD-10-CM | POA: Diagnosis not present

## 2014-08-17 DIAGNOSIS — E13621 Other specified diabetes mellitus with foot ulcer: Secondary | ICD-10-CM

## 2014-08-17 DIAGNOSIS — I10 Essential (primary) hypertension: Secondary | ICD-10-CM | POA: Diagnosis not present

## 2014-08-17 DIAGNOSIS — Z8669 Personal history of other diseases of the nervous system and sense organs: Secondary | ICD-10-CM | POA: Diagnosis not present

## 2014-08-17 DIAGNOSIS — J449 Chronic obstructive pulmonary disease, unspecified: Secondary | ICD-10-CM | POA: Diagnosis not present

## 2014-08-17 DIAGNOSIS — Z87891 Personal history of nicotine dependence: Secondary | ICD-10-CM | POA: Insufficient documentation

## 2014-08-17 DIAGNOSIS — K219 Gastro-esophageal reflux disease without esophagitis: Secondary | ICD-10-CM | POA: Diagnosis not present

## 2014-08-17 DIAGNOSIS — L089 Local infection of the skin and subcutaneous tissue, unspecified: Secondary | ICD-10-CM | POA: Diagnosis present

## 2014-08-17 DIAGNOSIS — M79673 Pain in unspecified foot: Secondary | ICD-10-CM

## 2014-08-17 DIAGNOSIS — Z7982 Long term (current) use of aspirin: Secondary | ICD-10-CM | POA: Insufficient documentation

## 2014-08-17 DIAGNOSIS — Z79899 Other long term (current) drug therapy: Secondary | ICD-10-CM | POA: Insufficient documentation

## 2014-08-17 DIAGNOSIS — Z8739 Personal history of other diseases of the musculoskeletal system and connective tissue: Secondary | ICD-10-CM | POA: Diagnosis not present

## 2014-08-17 DIAGNOSIS — E785 Hyperlipidemia, unspecified: Secondary | ICD-10-CM | POA: Diagnosis not present

## 2014-08-17 DIAGNOSIS — Z794 Long term (current) use of insulin: Secondary | ICD-10-CM | POA: Diagnosis not present

## 2014-08-17 DIAGNOSIS — Z9889 Other specified postprocedural states: Secondary | ICD-10-CM | POA: Insufficient documentation

## 2014-08-17 DIAGNOSIS — E11621 Type 2 diabetes mellitus with foot ulcer: Secondary | ICD-10-CM | POA: Diagnosis not present

## 2014-08-17 DIAGNOSIS — K2270 Barrett's esophagus without dysplasia: Secondary | ICD-10-CM | POA: Diagnosis not present

## 2014-08-17 DIAGNOSIS — L97519 Non-pressure chronic ulcer of other part of right foot with unspecified severity: Secondary | ICD-10-CM | POA: Diagnosis not present

## 2014-08-17 LAB — COMPREHENSIVE METABOLIC PANEL
ALT: 19 U/L (ref 0–53)
AST: 20 U/L (ref 0–37)
Albumin: 4.1 g/dL (ref 3.5–5.2)
Alkaline Phosphatase: 119 U/L — ABNORMAL HIGH (ref 39–117)
Anion gap: 16 — ABNORMAL HIGH (ref 5–15)
BUN: 20 mg/dL (ref 6–23)
CO2: 26 mEq/L (ref 19–32)
Calcium: 9.4 mg/dL (ref 8.4–10.5)
Chloride: 98 mEq/L (ref 96–112)
Creatinine, Ser: 1.09 mg/dL (ref 0.50–1.35)
GFR calc Af Amer: 83 mL/min — ABNORMAL LOW (ref >90)
GFR calc non Af Amer: 72 mL/min — ABNORMAL LOW (ref >90)
Glucose, Bld: 216 mg/dL — ABNORMAL HIGH (ref 70–99)
Potassium: 3.6 mEq/L — ABNORMAL LOW (ref 3.7–5.3)
Sodium: 140 mEq/L (ref 137–147)
Total Bilirubin: 0.5 mg/dL (ref 0.3–1.2)
Total Protein: 7.2 g/dL (ref 6.0–8.3)

## 2014-08-17 LAB — CBC WITH DIFFERENTIAL/PLATELET
Basophils Absolute: 0 10*3/uL (ref 0.0–0.1)
Basophils Relative: 0 % (ref 0–1)
Eosinophils Absolute: 0.1 10*3/uL (ref 0.0–0.7)
Eosinophils Relative: 1 % (ref 0–5)
HCT: 35.6 % — ABNORMAL LOW (ref 39.0–52.0)
Hemoglobin: 12.7 g/dL — ABNORMAL LOW (ref 13.0–17.0)
Lymphocytes Relative: 18 % (ref 12–46)
Lymphs Abs: 1.4 10*3/uL (ref 0.7–4.0)
MCH: 30.2 pg (ref 26.0–34.0)
MCHC: 35.7 g/dL (ref 30.0–36.0)
MCV: 84.6 fL (ref 78.0–100.0)
Monocytes Absolute: 0.5 10*3/uL (ref 0.1–1.0)
Monocytes Relative: 7 % (ref 3–12)
Neutro Abs: 5.7 10*3/uL (ref 1.7–7.7)
Neutrophils Relative %: 74 % (ref 43–77)
Platelets: 195 10*3/uL (ref 150–400)
RBC: 4.21 MIL/uL — ABNORMAL LOW (ref 4.22–5.81)
RDW: 12.9 % (ref 11.5–15.5)
WBC: 7.8 10*3/uL (ref 4.0–10.5)

## 2014-08-17 LAB — C-REACTIVE PROTEIN: CRP: <0.5 mg/dL — ABNORMAL LOW (ref <0.60)

## 2014-08-17 LAB — SEDIMENTATION RATE: Sed Rate: 10 mm/hr (ref 0–16)

## 2014-08-17 MED ORDER — VANCOMYCIN HCL IN DEXTROSE 1-5 GM/200ML-% IV SOLN: 1000.0000 mg | Freq: Once | INTRAVENOUS | Status: DC

## 2014-08-17 NOTE — ED Notes (Signed)
Patient presents with opened wound to his right outer foot.  Tender to touch, no drainage at this time.

## 2014-08-17 NOTE — ED Notes (Addendum)
Pt has 3/4-1cm wound on outer rt side of rt foot. States pain began Friday, no lesion noticed, after taking off socks and shoes on Saturday, noticed blood on sock and wound on foot. Pt denies fever, chills. Reports area is painful to the touch. Erythema present around wound. Pt reports some bloody drainage, denies any dark or foul smelling drainage.

## 2014-08-17 NOTE — Discharge Instructions (Signed)
Diabetes and Foot Care Diabetes may cause you to have problems because of poor blood supply (circulation) to your feet and legs. This may cause the skin on your feet to become thinner, break easier, and heal more slowly. Your skin may become dry, and the skin may peel and crack. You may also have nerve damage in your legs and feet causing decreased feeling in them. You may not notice minor injuries to your feet that could lead to infections or more serious problems. Taking care of your feet is one of the most important things you can do for yourself.  HOME CARE INSTRUCTIONS  Wear shoes at all times, even in the house. Do not go barefoot. Bare feet are easily injured.  Check your feet daily for blisters, cuts, and redness. If you cannot see the bottom of your feet, use a mirror or ask someone for help.  Wash your feet with warm water (do not use hot water) and mild soap. Then pat your feet and the areas between your toes until they are completely dry. Do not soak your feet as this can dry your skin.  Apply a moisturizing lotion or petroleum jelly (that does not contain alcohol and is unscented) to the skin on your feet and to dry, brittle toenails. Do not apply lotion between your toes.  Trim your toenails straight across. Do not dig under them or around the cuticle. File the edges of your nails with an emery board or nail file.  Do not cut corns or calluses or try to remove them with medicine.  Wear clean socks or stockings every day. Make sure they are not too tight. Do not wear knee-high stockings since they may decrease blood flow to your legs.  Wear shoes that fit properly and have enough cushioning. To break in new shoes, wear them for just a few hours a day. This prevents you from injuring your feet. Always look in your shoes before you put them on to be sure there are no objects inside.  Do not cross your legs. This may decrease the blood flow to your feet.  If you find a minor scrape,  cut, or break in the skin on your feet, keep it and the skin around it clean and dry. These areas may be cleansed with mild soap and water. Do not cleanse the area with peroxide, alcohol, or iodine.  When you remove an adhesive bandage, be sure not to damage the skin around it.  If you have a wound, look at it several times a day to make sure it is healing.  Do not use heating pads or hot water bottles. They may burn your skin. If you have lost feeling in your feet or legs, you may not know it is happening until it is too late.  Make sure your health care provider performs a complete foot exam at least annually or more often if you have foot problems. Report any cuts, sores, or bruises to your health care provider immediately. SEEK MEDICAL CARE IF:   You have an injury that is not healing.  You have cuts or breaks in the skin.  You have an ingrown nail.  You notice redness on your legs or feet.  You feel burning or tingling in your legs or feet.  You have pain or cramps in your legs and feet.  Your legs or feet are numb.  Your feet always feel cold. SEEK IMMEDIATE MEDICAL CARE IF:   There is increasing redness,   swelling, or pain in or around a wound.  There is a red line that goes up your leg.  Pus is coming from a wound.  You develop a fever or as directed by your health care provider.  You notice a bad smell coming from an ulcer or wound. Document Released: 08/24/2000 Document Revised: 04/29/2013 Document Reviewed: 02/03/2013 [employer] Patient Information 2015 [employer]. This information is not intended to replace advice given to you by your health care provider. Make sure you discuss any questions you have with your health care provider.  

## 2014-08-17 NOTE — ED Notes (Signed)
MD at bedside. 

## 2014-08-17 NOTE — ED Notes (Signed)
Pt a/o x 4 with steady gait.

## 2014-08-17 NOTE — ED Provider Notes (Signed)
CSN: [id number]     Arrival date & time 08/17/14  0024 History  This chart was scribed for Harold Rice, MD by Stephania Fragmin, ED Scribe. This patient was seen in room D36C/D36C and the patient's care was started at 2:17 AM.    Chief Complaint  Patient presents with  . Foot Wound    The history is provided by the patient and medical records. No language interpreter was used.     HPI Comments: [name] is a 58 y.o. male who presents to the Emergency Department complaining of a right foot wound that began 3-4 days ago. He first had pain to the area 4 days ago and noticed a lesion and blood on his sock 3 days ago when he removed his shoes. He complains of associated pain and bleeding. He denies fever, chills, or any dark or foul-smelling drainage. Patient with previous history of osteomyelitis and multiple toe amputations.    Past Medical History  Diagnosis Date  . Carotid artery occlusion 11/10/10    LEFT CAROTID ENDARTERECTOMY  . Diabetes mellitus   . Hypertension   . Hyperlipidemia   . COPD (chronic obstructive pulmonary disease)   . DJD (degenerative joint disease)   . GERD (gastroesophageal reflux disease)   . Substernal chest pain   . 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 Slurred speech     AS PER WIFE IN D/C NOTE 11/10/10  . PAD (peripheral artery disease)     Distal aortogram June 2012. Atherectomy left popliteal artery July 2012.   . Barrett's esophagus   . Fatty liver   . Diverticulosis of colon (without mention of hemorrhage)   . Emphysema of lung   . Osteomyelitis   . Complication of anesthesia     BP WENT UP AT [facility] "  . Sleep apnea     has not used CPAP in e years - took self off  . H/O hiatal hernia   . Neuromuscular disorder     peripheral neuropathy  . Full dentures   . Wears glasses    Past Surgical History  Procedure Laterality Date  . Laminectomy      X 3 LUMBAR AND X 2 CERVICAL SPINE OPERATIONS  . Carotid endarterectomy   11/10/10  . Hernia repair      LEFT INGUINAL AND UMBILICAL REPAIRS  . Anterior fusion cervical spine  02/06/06    C4-5, C5-6, C6-7; SURGEON DR. MAX COHEN  . Penile prosthesis implant  08/14/05    INFRAPUBIC INSERTION OF INFLATABLE PENILE PROSTHESIS; SURGEON DR. Amalia Hailey  . Laparoscopic cholecystectomy w/ cholangiography  11/09/04    SURGEON DR. Luella Cook  . Total knee arthroplasty  07/2002    RIGHT KNEE ; SURGEON  DR. [name] ALSO HAD ARTHROSCOPIC RIGHT KNEE IN  10/2001  . Shoulder arthroscopy    . Cholecystectomy    . Amputation  11/05/2011    Procedure: AMPUTATION RAY;  Surgeon: Wylene Simmer, MD;  Location: [place];  Service: Orthopedics;  Laterality: Right;  Amputation of Right 4&5th Toes  . Femoral artery stent      x6  . Amputation Left 11/26/2012    Procedure: AMPUTATION RAY;  Surgeon: Wylene Simmer, MD;  Location: [place];  Service: Orthopedics;  Laterality: Left;  fourth ray amputation  . Cardiac catheterization  10/31/04    2009  . Carpal tunnel release Right 10/21/2013    Procedure: RIGHT CARPAL TUNNEL RELEASE;  Surgeon: Dominica Severin  Beola Cord, MD;  Location: [place];  Service: Orthopedics;  Laterality: Right;  . Ulnar nerve transposition Right 10/21/2013    Procedure: RIGHT ELBOW  ULNAR NERVE DECOMPRESSION;  Surgeon: Wynonia Sours, MD;  Location: [facility];  Service: Orthopedics;  Laterality: Right;  . Joint replacement Right 2001    Total  . Spine surgery    . Neck surgery    . Esophageal manometry Bilateral 07/19/2014    Procedure: ESOPHAGEAL MANOMETRY (EM);  Surgeon: Jerene Bears, MD;  Location: [facility];  Service: Gastroenterology;  Laterality: Bilateral;   Family History  Problem Relation Age of Onset  . Heart disease Father     Before age 24-  CAD, BPG  . Diabetes Father     Amputation  . Cancer Father     PROSTATE  . Hyperlipidemia Father   . Hypertension Father   . Heart attack Father     Triple BPG  . Arthritis      GRANDMOTHER  . Hypertension       OTHER FAMILY MEMBERS  . Colon cancer Brother   . Cancer Brother     Colon  . Diabetes Brother   . Heart disease Brother 36    A-Fib. Before age 50  . Hyperlipidemia Brother   . Hypertension Brother   . Cancer Sister     Breast   History  Substance Use Topics  . Smoking status: Former Smoker -- 0.50 packs/day for 35 years    Types: Cigarettes    Quit date: 10/31/2011  . Smokeless tobacco: Current User     Comment: VAPOR CIGARETTES  . Alcohol Use: No     Comment: ONCE IN A WHILE    Review of Systems  Constitutional: Negative for fever and chills.  Gastrointestinal: Negative for nausea and vomiting.  Skin: Positive for wound.  Neurological: Negative for weakness and numbness.  All other systems reviewed and are negative.     Allergies  Ivp dye and Adhesive  Home Medications   Prior to Admission medications   Medication Sig Start Date End Date Taking? Authorizing Provider  ALPRAZolam Duanne Moron) 0.5 MG tablet Take 0.25 mg by mouth 3 (three) times daily.    Yes Historical Provider, MD  aspirin EC 81 MG tablet Take 81 mg by mouth daily.   Yes Historical Provider, MD  atorvastatin (LIPITOR) 40 MG tablet Take 40 mg by mouth daily.   Yes Historical Provider, MD  AZOR 5-20 MG per tablet Take 1 tablet by mouth daily.  07/29/14  Yes Historical Provider, MD  B Complex-C (B-COMPLEX WITH VITAMIN C) tablet Take 1 tablet by mouth daily.   Yes Historical Provider, MD  carvedilol (COREG) 25 MG tablet Take 25 mg by mouth every morning.    Yes Historical Provider, MD  carvedilol (COREG) 6.25 MG tablet Take 6.25 mg by mouth daily.    Yes Historical Provider, MD  clopidogrel (PLAVIX) 75 MG tablet Take 75 mg by mouth daily. 1 tab daily 03/28/12  Yes Historical Provider, MD  ezetimibe (ZETIA) 10 MG tablet Take 10 mg by mouth every morning.   Yes Historical Provider, MD  fentaNYL (DURAGESIC - DOSED MCG/HR) 50 MCG/HR Place 1 patch onto the skin every 3 (three) days.   Yes Historical Provider,  MD  furosemide (LASIX) 80 MG tablet Take 80 mg by mouth daily.   Yes Historical Provider, MD  glyBURIDE-metformin (GLUCOVANCE) 2.5-500 MG per tablet Take 2 tablets by mouth 2 (two) times daily.  03/05/12  Yes Historical Provider, MD  HYDROmorphone (DILAUDID) 2 MG tablet Take 1 tablet (2 mg total) by mouth every 4 (four) hours as needed for pain. 11/27/12  Yes Ripudeep Krystal Eaton, MD  insulin glargine (LANTUS) 100 UNIT/ML injection Inject 50 Units into the skin at bedtime.    Yes Historical Provider, MD  insulin glulisine (APIDRA) 100 UNIT/ML injection Inject 10-20 Units into the skin 3 (three) times daily before meals. 10 breakfast 10 lunch and 20 supper   Yes Historical Provider, MD  lansoprazole (PREVACID) 30 MG capsule Take 30 mg by mouth 2 (two) times daily.   Yes Historical Provider, MD  Linaclotide Rolan Lipa) 145 MCG CAPS capsule Take 1 capsule (145 mcg total) by mouth daily. 08/09/14  Yes Jerene Bears, MD  Multiple Vitamin (MULTIVITAMIN) tablet Take 1 tablet by mouth daily.   Yes Historical Provider, MD  nitroGLYCERIN (NITROSTAT) 0.4 MG SL tablet Place 1 tablet (0.4 mg total) under the tongue every 5 (five) minutes as needed (as needed for esophageal spasm). 08/09/14  Yes Jerene Bears, MD  omega-3 acid ethyl esters (LOVAZA) 1 G capsule Take 2,000 mg by mouth 2 (two) times daily.    Yes Historical Provider, MD  ondansetron (ZOFRAN-ODT) 4 MG disintegrating tablet Take 4 mg by mouth 4 (four) times daily as needed for nausea.   Yes Historical Provider, MD  oxyCODONE-acetaminophen (PERCOCET/ROXICET) 5-325 MG per tablet Take 1 tablet by mouth every 8 (eight) hours as needed for pain.   Yes Historical Provider, MD  pregabalin (LYRICA) 75 MG capsule Take 75 mg by mouth 3 (three) times daily.    Yes Historical Provider, MD  rOPINIRole (REQUIP) 2 MG tablet Take 2 mg by mouth at bedtime.    Yes Historical Provider, MD  Tamsulosin HCl (FLOMAX) 0.4 MG CAPS Take 0.4 mg by mouth daily.   Yes Historical Provider, MD    BP 128/56 mmHg  Pulse 60  Temp(Src) 97.9 F (36.6 C) (Oral)  Resp 16  Ht 6\' 2"  (1.88 m)  Wt 250 lb (113.399 kg)  BMI 32.08 kg/m2  SpO2 98% Physical Exam  Constitutional: He is oriented to person, place, and time. He appears well-developed and well-nourished. No distress.  HENT:  Head: Normocephalic and atraumatic.  Mouth/Throat: Oropharynx is clear and moist.  Eyes: EOM are normal. Pupils are equal, round, and reactive to light.  Neck: Normal range of motion. Neck supple.  Cardiovascular: Normal rate and regular rhythm.   Pulmonary/Chest: Effort normal and breath sounds normal. No respiratory distress. He has no wheezes. He has no rales.  Abdominal: Soft. Bowel sounds are normal.  Musculoskeletal: Normal range of motion. He exhibits no edema or tenderness.  Neurological: He is alert and oriented to person, place, and time.  Skin: Skin is warm and dry. No rash noted. No erythema.  Superficial ulceration to the lateral surface of the right foot. There is no associated erythema or discharge. Mild tenderness with palpation. Patient has a 2+ dorsalis pedis pulses of that foot. Good cap refill.  Psychiatric: He has a normal mood and affect. His behavior is normal.  Nursing note and vitals reviewed.   ED Course  Procedures (including critical care time)  DIAGNOSTIC STUDIES: Oxygen Saturation is 99% on room air, normal by my interpretation.    COORDINATION OF CARE: 2:20 AM - Discussed treatment plan with pt at bedside which includes tests and pt agreed to plan.   Labs Review Labs Reviewed  CBC WITH DIFFERENTIAL - Abnormal; Notable for  the following:    RBC 4.21 (*)    Hemoglobin 12.7 (*)    HCT 35.6 (*)    All other components within normal limits  COMPREHENSIVE METABOLIC PANEL - Abnormal; Notable for the following:    Potassium 3.6 (*)    Glucose, Bld 216 (*)    Alkaline Phosphatase 119 (*)    GFR calc non Af Amer 72 (*)    GFR calc Af Amer 83 (*)    Anion gap 16 (*)     All other components within normal limits  C-REACTIVE PROTEIN - Abnormal; Notable for the following:    CRP <0.5 (*)    All other components within normal limits  SEDIMENTATION RATE    Imaging Review Dg Foot Complete Right  08/17/2014   CLINICAL DATA:  Foot pain. Infection in the right lateral foot. Previous amputations and diabetic complications.  EXAM: RIGHT FOOT COMPLETE - 3+ VIEW  COMPARISON:  04/20/2014  FINDINGS: Previous amputations of the right fourth and fifth toes at the mid metatarsal region. There is interval development of soft tissue lucency distal to the fourth metatarsal stump. This suggests soft tissue infection. There is no evidence of bone erosion or bone irregularity. No findings to suggest osteomyelitis. Prominent degenerative changes in the first metatarsal-phalangeal joint with severe loss of joint space and prominent hypertrophic osteophytes. No acute fracture or dislocation. Degenerative changes in the intertarsal joints.  IMPRESSION: Previous mid metatarsal fourth and fifth toe amputations. Soft tissue gas collection distal to the fourth metatarsal consistent with infection. No changes to suggest osteomyelitis. Severe degenerative changes in the first metatarsal-phalangeal joint.   Electronically Signed   By: Lucienne Capers M.D.   On: 08/17/2014 03:40     EKG Interpretation None      MDM   Final diagnoses:  Foot pain  Diabetic ulcer of right foot associated with other specified diabetes mellitus    I personally performed the services described in this documentation, which was scribed in my presence. The recorded information has been reviewed and is accurate.  Patient with a superficial ulcerations of the right foot. There is no evidence of any infection on exam. He has a normal white blood cell count and sedimentation rate. Radiologist read x-ray has concerning for infection. Consulted with hospitalist who evaluated the patient. See hospitalist note. Do not  believe the foot is infected at this point. Also advised against antibiotics. Recommended close follow-up and return precautions. Patient is in agreement with plan.   Harold Rice, MD 08/18/14 [phone]

## 2014-08-17 NOTE — Consult Note (Signed)
[facility] Medical Consultation  [name] [id number] DOB: June 13, 1956 DOA: 08/17/2014 PCP: [name], MD   Requesting physician: Dr. Lita Mains Date of consultation: 08/17/14 Reason for consultation: Diabetic foot ulcer  Impression/Recommendations Principal Problem:   Type 2 diabetes mellitus with right diabetic foot ulcer    1. Diabetic foot ulcer of R foot - At this point this is a Class A (noninfected) ulcer, as a result the proper treatment is local wound care and offloading of the affected site.  Patient should follow up with a wound care specialist closely and ASAP.  While certainly at high risk for future infection and this ulcer could become infected at any time, I do not see a role for IV antibiotics at this time in this ulcer which on clinical exam appears to be non-infected.  The X-ray is reviewed as well, but the clinical picture does not support infection at this time. 1. Patient aware of what to look for, if this does become infected, needs to get back in and get ABx immediately. 2. Needs outpatient follow up with wound care specialist ASAP for offloading and local wound care. 3. Given the very palpable DP pulse on the foot, strongly suspect that his stents in that leg are patent and doubt much role for vascular surgery, the ulcer (which was bleeding freely per patient) does not have an ischemic ulcer or other signs of ischemia.  Doubt that he needs any revascularization at this point but will defer to wound specialist.    Chief Complaint: Foot ulcer  HPI:  58 yo M with history of PAD, diabetic foot ulcers s/p amputation of R 4th and 5th toes.  Patient presents to ED with new onset ulcer of his R foot on the lateral aspect.  Symptoms onset 3-4 days ago, wound is painful and bleeding.  He noticed the ulcer when he saw blood in his sock.  He performed local wound care with neosporin and although there initially was erythema around the ulcer this has resolved  completely.  There is no foul odor, no drainage, no fever, no chills.  Review of Systems:  12 systems reviewed and otherwise negative.  Past Medical History  Diagnosis Date  . Carotid artery occlusion 11/10/10    LEFT CAROTID ENDARTERECTOMY  . Diabetes mellitus   . Hypertension   . Hyperlipidemia   . COPD (chronic obstructive pulmonary disease)   . DJD (degenerative joint disease)   . GERD (gastroesophageal reflux disease)   . Substernal chest pain   . 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 Slurred speech     AS PER WIFE IN D/C NOTE 11/10/10  . PAD (peripheral artery disease)     Distal aortogram June 2012. Atherectomy left popliteal artery July 2012.   . Barrett's esophagus   . Fatty liver   . Diverticulosis of colon (without mention of hemorrhage)   . Emphysema of lung   . Osteomyelitis   . Complication of anesthesia     BP WENT UP AT [facility] "  . Sleep apnea     has not used CPAP in e years - took self off  . H/O hiatal hernia   . Neuromuscular disorder     peripheral neuropathy  . Full dentures   . Wears glasses    Past Surgical History  Procedure Laterality Date  . Laminectomy      X 3 LUMBAR AND X 2 CERVICAL SPINE OPERATIONS  . Carotid  endarterectomy  11/10/10  . Hernia repair      LEFT INGUINAL AND UMBILICAL REPAIRS  . Anterior fusion cervical spine  02/06/06    C4-5, C5-6, C6-7; SURGEON DR. MAX COHEN  . Penile prosthesis implant  08/14/05    INFRAPUBIC INSERTION OF INFLATABLE PENILE PROSTHESIS; SURGEON DR. Amalia Hailey  . Laparoscopic cholecystectomy w/ cholangiography  11/09/04    SURGEON DR. Luella Cook  . Total knee arthroplasty  07/2002    RIGHT KNEE ; SURGEON  DR. [name] ALSO HAD ARTHROSCOPIC RIGHT KNEE IN  10/2001  . Shoulder arthroscopy    . Cholecystectomy    . Amputation  11/05/2011    Procedure: AMPUTATION RAY;  Surgeon: Wylene Simmer, MD;  Location: [place];  Service: Orthopedics;  Laterality: Right;  Amputation of Right 4&5th Toes  . Femoral  artery stent      x6  . Amputation Left 11/26/2012    Procedure: AMPUTATION RAY;  Surgeon: Wylene Simmer, MD;  Location: [place];  Service: Orthopedics;  Laterality: Left;  fourth ray amputation  . Cardiac catheterization  10/31/04    2009  . Carpal tunnel release Right 10/21/2013    Procedure: RIGHT CARPAL TUNNEL RELEASE;  Surgeon: Wynonia Sours, MD;  Location: [place];  Service: Orthopedics;  Laterality: Right;  . Ulnar nerve transposition Right 10/21/2013    Procedure: RIGHT ELBOW  ULNAR NERVE DECOMPRESSION;  Surgeon: Wynonia Sours, MD;  Location: [place];  Service: Orthopedics;  Laterality: Right;  . Joint replacement Right 2001    Total  . Spine surgery    . Neck surgery    . Esophageal manometry Bilateral 07/19/2014    Procedure: ESOPHAGEAL MANOMETRY (EM);  Surgeon: Jerene Bears, MD;  Location: [facility];  Service: Gastroenterology;  Laterality: Bilateral;   Social History:  reports that he quit smoking about 2 years ago. His smoking use included Cigarettes. He has a 17.5 pack-year smoking history. He uses smokeless tobacco. He reports that he does not drink alcohol or use illicit drugs.  Allergies  Allergen Reactions  . Ivp Dye [Iodinated Diagnostic Agents] Anaphylaxis    Breathing problems  . Adhesive [Tape] Rash   Family History  Problem Relation Age of Onset  . Heart disease Father     Before age 29-  CAD, BPG  . Diabetes Father     Amputation  . Cancer Father     PROSTATE  . Hyperlipidemia Father   . Hypertension Father   . Heart attack Father     Triple BPG  . Arthritis      GRANDMOTHER  . Hypertension      OTHER FAMILY MEMBERS  . Colon cancer Brother   . Cancer Brother     Colon  . Diabetes Brother   . Heart disease Brother 58    A-Fib. Before age 61  . Hyperlipidemia Brother   . Hypertension Brother   . Cancer Sister     Breast    Prior to Admission medications   Medication Sig Start Date End Date Taking? Authorizing  Provider  ALPRAZolam Duanne Moron) 0.5 MG tablet Take 0.25 mg by mouth 3 (three) times daily.    Yes Historical Provider, MD  aspirin EC 81 MG tablet Take 81 mg by mouth daily.   Yes Historical Provider, MD  atorvastatin (LIPITOR) 40 MG tablet Take 40 mg by mouth daily.   Yes Historical Provider, MD  AZOR 5-20 MG per tablet Take 1 tablet by mouth daily.  07/29/14  Yes Historical Provider, MD  B Complex-C (B-COMPLEX WITH VITAMIN C) tablet Take 1 tablet by mouth daily.   Yes Historical Provider, MD  carvedilol (COREG) 25 MG tablet Take 25 mg by mouth every morning.    Yes Historical Provider, MD  carvedilol (COREG) 6.25 MG tablet Take 6.25 mg by mouth daily.    Yes Historical Provider, MD  clopidogrel (PLAVIX) 75 MG tablet Take 75 mg by mouth daily. 1 tab daily 03/28/12  Yes Historical Provider, MD  ezetimibe (ZETIA) 10 MG tablet Take 10 mg by mouth every morning.   Yes Historical Provider, MD  fentaNYL (DURAGESIC - DOSED MCG/HR) 50 MCG/HR Place 1 patch onto the skin every 3 (three) days.   Yes Historical Provider, MD  furosemide (LASIX) 80 MG tablet Take 80 mg by mouth daily.   Yes Historical Provider, MD  glyBURIDE-metformin (GLUCOVANCE) 2.5-500 MG per tablet Take 2 tablets by mouth 2 (two) times daily.  03/05/12  Yes Historical Provider, MD  HYDROmorphone (DILAUDID) 2 MG tablet Take 1 tablet (2 mg total) by mouth every 4 (four) hours as needed for pain. 11/27/12  Yes Ripudeep Krystal Eaton, MD  insulin glargine (LANTUS) 100 UNIT/ML injection Inject 50 Units into the skin at bedtime.    Yes Historical Provider, MD  insulin glulisine (APIDRA) 100 UNIT/ML injection Inject 10-20 Units into the skin 3 (three) times daily before meals. 10 breakfast 10 lunch and 20 supper   Yes Historical Provider, MD  lansoprazole (PREVACID) 30 MG capsule Take 30 mg by mouth 2 (two) times daily.   Yes Historical Provider, MD  Linaclotide Rolan Lipa) 145 MCG CAPS capsule Take 1 capsule (145 mcg total) by mouth daily. 08/09/14  Yes Jerene Bears, MD  Multiple Vitamin (MULTIVITAMIN) tablet Take 1 tablet by mouth daily.   Yes Historical Provider, MD  nitroGLYCERIN (NITROSTAT) 0.4 MG SL tablet Place 1 tablet (0.4 mg total) under the tongue every 5 (five) minutes as needed (as needed for esophageal spasm). 08/09/14  Yes Jerene Bears, MD  omega-3 acid ethyl esters (LOVAZA) 1 G capsule Take 2,000 mg by mouth 2 (two) times daily.    Yes Historical Provider, MD  ondansetron (ZOFRAN-ODT) 4 MG disintegrating tablet Take 4 mg by mouth 4 (four) times daily as needed for nausea.   Yes Historical Provider, MD  oxyCODONE-acetaminophen (PERCOCET/ROXICET) 5-325 MG per tablet Take 1 tablet by mouth every 8 (eight) hours as needed for pain.   Yes Historical Provider, MD  pregabalin (LYRICA) 75 MG capsule Take 75 mg by mouth 3 (three) times daily.    Yes Historical Provider, MD  rOPINIRole (REQUIP) 2 MG tablet Take 2 mg by mouth at bedtime.    Yes Historical Provider, MD  Tamsulosin HCl (FLOMAX) 0.4 MG CAPS Take 0.4 mg by mouth daily.   Yes Historical Provider, MD   Physical Exam: Blood pressure 133/56, pulse 69, temperature 97.9 F (36.6 C), temperature source Oral, resp. rate 16, height 6\' 2"  (1.88 m), weight 113.399 kg (250 lb), SpO2 97 %. Filed Vitals:   08/17/14 0400  BP: 133/56  Pulse: 69  Temp:   Resp:     General:  NAD, resting comfortably in bed Eyes: PEERLA EOMI ENT: mucous membranes moist Neck: supple w/o JVD Cardiovascular: RRR w/o MRG, strong palpable DP pulse in the RLE foot Respiratory: CTA B Abdomen: soft, nt, nd, bs+ Skin: ulcer on the anterior lateral aspect of R foot, posterior to prior amputation sites.  The ulcer is not erythematous,  no drainage, grossly appears uninfected. Musculoskeletal: MAE, full ROM all 4 extremities Psychiatric: normal tone and affect Neurologic: AAOx3, grossly non-focal  Labs on Admission:  Basic Metabolic Panel:  Recent Labs Lab 08/17/14 0100  NA 140  K 3.6*  CL 98  CO2 26  GLUCOSE  216*  BUN 20  CREATININE 1.09  CALCIUM 9.4   Liver Function Tests:  Recent Labs Lab 08/17/14 0100  AST 20  ALT 19  ALKPHOS 119*  BILITOT 0.5  PROT 7.2  ALBUMIN 4.1   No results for input(s): LIPASE, AMYLASE in the last 168 hours. No results for input(s): AMMONIA in the last 168 hours. CBC:  Recent Labs Lab 08/17/14 0100  WBC 7.8  NEUTROABS 5.7  HGB 12.7*  HCT 35.6*  MCV 84.6  PLT 195   Cardiac Enzymes: No results for input(s): CKTOTAL, CKMB, CKMBINDEX, TROPONINI in the last 168 hours. BNP: Invalid input(s): POCBNP CBG: No results for input(s): GLUCAP in the last 168 hours.  Radiological Exams on Admission: Dg Foot Complete Right  08/17/2014   CLINICAL DATA:  Foot pain. Infection in the right lateral foot. Previous amputations and diabetic complications.  EXAM: RIGHT FOOT COMPLETE - 3+ VIEW  COMPARISON:  04/20/2014  FINDINGS: Previous amputations of the right fourth and fifth toes at the mid metatarsal region. There is interval development of soft tissue lucency distal to the fourth metatarsal stump. This suggests soft tissue infection. There is no evidence of bone erosion or bone irregularity. No findings to suggest osteomyelitis. Prominent degenerative changes in the first metatarsal-phalangeal joint with severe loss of joint space and prominent hypertrophic osteophytes. No acute fracture or dislocation. Degenerative changes in the intertarsal joints.  IMPRESSION: Previous mid metatarsal fourth and fifth toe amputations. Soft tissue gas collection distal to the fourth metatarsal consistent with infection. No changes to suggest osteomyelitis. Severe degenerative changes in the first metatarsal-phalangeal joint.   Electronically Signed   By: Lucienne Capers M.D.   On: 08/17/2014 03:40    EKG: Independently reviewed.  Time spent: 70 min  GARDNER, JARED M. [facility] Pager [phone]  If 7PM-7AM, please contact night-coverage [email or web address] Password  [email or web address] 08/17/2014, 5:02 AM

## 2014-08-19 ENCOUNTER — Encounter (HOSPITAL_COMMUNITY): Payer: Self-pay | Admitting: Cardiovascular Disease

## 2014-08-24 ENCOUNTER — Inpatient Hospital Stay (HOSPITAL_COMMUNITY)
Admission: EM | Admit: 2014-08-24 | Discharge: 2014-08-28 | DRG: 475 | Disposition: A | Discharge status: Home / Self Care | Payer: BC Managed Care – PPO | Attending: Internal Medicine | Admitting: Internal Medicine

## 2014-08-24 ENCOUNTER — Telehealth: Payer: Self-pay | Admitting: *Deleted

## 2014-08-24 ENCOUNTER — Emergency Department (HOSPITAL_COMMUNITY): Payer: BC Managed Care – PPO

## 2014-08-24 ENCOUNTER — Ambulatory Visit (INDEPENDENT_AMBULATORY_CARE_PROVIDER_SITE_OTHER): Payer: Medicare Other

## 2014-08-24 ENCOUNTER — Encounter (HOSPITAL_COMMUNITY): Payer: Self-pay | Admitting: *Deleted

## 2014-08-24 VITALS — BP 160/86 | HR 80 | Temp 99.8°F | Resp 18

## 2014-08-24 DIAGNOSIS — Z91048 Other nonmedicinal substance allergy status: Secondary | ICD-10-CM | POA: Diagnosis not present

## 2014-08-24 DIAGNOSIS — G629 Polyneuropathy, unspecified: Secondary | ICD-10-CM | POA: Diagnosis present

## 2014-08-24 DIAGNOSIS — Z9119 Patient's noncompliance with other medical treatment and regimen: Secondary | ICD-10-CM | POA: Diagnosis present

## 2014-08-24 DIAGNOSIS — Z9889 Other specified postprocedural states: Secondary | ICD-10-CM

## 2014-08-24 DIAGNOSIS — E1152 Type 2 diabetes mellitus with diabetic peripheral angiopathy with gangrene: Secondary | ICD-10-CM

## 2014-08-24 DIAGNOSIS — E1140 Type 2 diabetes mellitus with diabetic neuropathy, unspecified: Secondary | ICD-10-CM | POA: Diagnosis not present

## 2014-08-24 DIAGNOSIS — Z833 Family history of diabetes mellitus: Secondary | ICD-10-CM

## 2014-08-24 DIAGNOSIS — K5790 Diverticulosis of intestine, part unspecified, without perforation or abscess without bleeding: Secondary | ICD-10-CM | POA: Diagnosis present

## 2014-08-24 DIAGNOSIS — K760 Fatty (change of) liver, not elsewhere classified: Secondary | ICD-10-CM | POA: Diagnosis present

## 2014-08-24 DIAGNOSIS — E11621 Type 2 diabetes mellitus with foot ulcer: Secondary | ICD-10-CM | POA: Diagnosis present

## 2014-08-24 DIAGNOSIS — L02619 Cutaneous abscess of unspecified foot: Secondary | ICD-10-CM

## 2014-08-24 DIAGNOSIS — K219 Gastro-esophageal reflux disease without esophagitis: Secondary | ICD-10-CM | POA: Diagnosis present

## 2014-08-24 DIAGNOSIS — E11628 Type 2 diabetes mellitus with other skin complications: Secondary | ICD-10-CM

## 2014-08-24 DIAGNOSIS — D696 Thrombocytopenia, unspecified: Secondary | ICD-10-CM

## 2014-08-24 DIAGNOSIS — E785 Hyperlipidemia, unspecified: Secondary | ICD-10-CM | POA: Diagnosis present

## 2014-08-24 DIAGNOSIS — I10 Essential (primary) hypertension: Secondary | ICD-10-CM | POA: Diagnosis present

## 2014-08-24 DIAGNOSIS — M1990 Unspecified osteoarthritis, unspecified site: Secondary | ICD-10-CM | POA: Diagnosis present

## 2014-08-24 DIAGNOSIS — E08621 Diabetes mellitus due to underlying condition with foot ulcer: Secondary | ICD-10-CM

## 2014-08-24 DIAGNOSIS — G4733 Obstructive sleep apnea (adult) (pediatric): Secondary | ICD-10-CM | POA: Diagnosis present

## 2014-08-24 DIAGNOSIS — Z89432 Acquired absence of left foot: Secondary | ICD-10-CM

## 2014-08-24 DIAGNOSIS — D649 Anemia, unspecified: Secondary | ICD-10-CM | POA: Diagnosis present

## 2014-08-24 DIAGNOSIS — Z91041 Radiographic dye allergy status: Secondary | ICD-10-CM | POA: Diagnosis not present

## 2014-08-24 DIAGNOSIS — Z79899 Other long term (current) drug therapy: Secondary | ICD-10-CM | POA: Diagnosis not present

## 2014-08-24 DIAGNOSIS — Z9689 Presence of other specified functional implants: Secondary | ICD-10-CM | POA: Diagnosis present

## 2014-08-24 DIAGNOSIS — L97519 Non-pressure chronic ulcer of other part of right foot with unspecified severity: Secondary | ICD-10-CM | POA: Diagnosis present

## 2014-08-24 DIAGNOSIS — I6522 Occlusion and stenosis of left carotid artery: Secondary | ICD-10-CM | POA: Diagnosis present

## 2014-08-24 DIAGNOSIS — Z9660 Presence of unspecified orthopedic joint implant: Secondary | ICD-10-CM | POA: Diagnosis present

## 2014-08-24 DIAGNOSIS — Z794 Long term (current) use of insulin: Secondary | ICD-10-CM | POA: Diagnosis not present

## 2014-08-24 DIAGNOSIS — E876 Hypokalemia: Secondary | ICD-10-CM | POA: Diagnosis not present

## 2014-08-24 DIAGNOSIS — IMO0002 Reserved for concepts with insufficient information to code with codable children: Secondary | ICD-10-CM

## 2014-08-24 DIAGNOSIS — IMO0001 Reserved for inherently not codable concepts without codable children: Secondary | ICD-10-CM

## 2014-08-24 DIAGNOSIS — I6529 Occlusion and stenosis of unspecified carotid artery: Secondary | ICD-10-CM

## 2014-08-24 DIAGNOSIS — Z7902 Long term (current) use of antithrombotics/antiplatelets: Secondary | ICD-10-CM | POA: Diagnosis not present

## 2014-08-24 DIAGNOSIS — E1165 Type 2 diabetes mellitus with hyperglycemia: Secondary | ICD-10-CM | POA: Diagnosis present

## 2014-08-24 DIAGNOSIS — Z87891 Personal history of nicotine dependence: Secondary | ICD-10-CM | POA: Diagnosis not present

## 2014-08-24 DIAGNOSIS — Z9049 Acquired absence of other specified parts of digestive tract: Secondary | ICD-10-CM | POA: Diagnosis present

## 2014-08-24 DIAGNOSIS — J449 Chronic obstructive pulmonary disease, unspecified: Secondary | ICD-10-CM | POA: Diagnosis present

## 2014-08-24 DIAGNOSIS — L03119 Cellulitis of unspecified part of limb: Secondary | ICD-10-CM

## 2014-08-24 DIAGNOSIS — L03115 Cellulitis of right lower limb: Secondary | ICD-10-CM | POA: Diagnosis present

## 2014-08-24 DIAGNOSIS — Z8249 Family history of ischemic heart disease and other diseases of the circulatory system: Secondary | ICD-10-CM | POA: Diagnosis not present

## 2014-08-24 DIAGNOSIS — Z7982 Long term (current) use of aspirin: Secondary | ICD-10-CM | POA: Diagnosis not present

## 2014-08-24 DIAGNOSIS — I739 Peripheral vascular disease, unspecified: Secondary | ICD-10-CM | POA: Diagnosis present

## 2014-08-24 DIAGNOSIS — Z95820 Peripheral vascular angioplasty status with implants and grafts: Secondary | ICD-10-CM | POA: Diagnosis not present

## 2014-08-24 DIAGNOSIS — M868X7 Other osteomyelitis, ankle and foot: Principal | ICD-10-CM | POA: Diagnosis present

## 2014-08-24 DIAGNOSIS — L089 Local infection of the skin and subcutaneous tissue, unspecified: Secondary | ICD-10-CM

## 2014-08-24 DIAGNOSIS — K2270 Barrett's esophagus without dysplasia: Secondary | ICD-10-CM | POA: Diagnosis present

## 2014-08-24 DIAGNOSIS — E119 Type 2 diabetes mellitus without complications: Secondary | ICD-10-CM

## 2014-08-24 LAB — CBC WITH DIFFERENTIAL/PLATELET
Basophils Absolute: 0 10*3/uL (ref 0.0–0.1)
Basophils Relative: 0 % (ref 0–1)
Eosinophils Absolute: 0.1 10*3/uL (ref 0.0–0.7)
Eosinophils Relative: 1 % (ref 0–5)
HCT: 31.6 % — ABNORMAL LOW (ref 39.0–52.0)
Hemoglobin: 11.5 g/dL — ABNORMAL LOW (ref 13.0–17.0)
Lymphocytes Relative: 16 % (ref 12–46)
Lymphs Abs: 1.3 10*3/uL (ref 0.7–4.0)
MCH: 30.6 pg (ref 26.0–34.0)
MCHC: 36.4 g/dL — ABNORMAL HIGH (ref 30.0–36.0)
MCV: 84 fL (ref 78.0–100.0)
Monocytes Absolute: 0.7 10*3/uL (ref 0.1–1.0)
Monocytes Relative: 8 % (ref 3–12)
Neutro Abs: 6.4 10*3/uL (ref 1.7–7.7)
Neutrophils Relative %: 75 % (ref 43–77)
Platelets: 96 10*3/uL — ABNORMAL LOW (ref 150–400)
RBC: 3.76 MIL/uL — ABNORMAL LOW (ref 4.22–5.81)
RDW: 12.8 % (ref 11.5–15.5)
WBC: 8.5 10*3/uL (ref 4.0–10.5)

## 2014-08-24 LAB — COMPREHENSIVE METABOLIC PANEL
ALT: 11 U/L (ref 0–53)
AST: 15 U/L (ref 0–37)
Albumin: 3.8 g/dL (ref 3.5–5.2)
Alkaline Phosphatase: 125 U/L — ABNORMAL HIGH (ref 39–117)
Anion gap: 15 (ref 5–15)
BUN: 12 mg/dL (ref 6–23)
CO2: 25 mEq/L (ref 19–32)
Calcium: 8.6 mg/dL (ref 8.4–10.5)
Chloride: 95 mEq/L — ABNORMAL LOW (ref 96–112)
Creatinine, Ser: 1.07 mg/dL (ref 0.50–1.35)
GFR calc Af Amer: 85 mL/min — ABNORMAL LOW (ref >90)
GFR calc non Af Amer: 74 mL/min — ABNORMAL LOW (ref >90)
Glucose, Bld: 224 mg/dL — ABNORMAL HIGH (ref 70–99)
Potassium: 3.9 mEq/L (ref 3.7–5.3)
Sodium: 135 mEq/L — ABNORMAL LOW (ref 137–147)
Total Bilirubin: 0.6 mg/dL (ref 0.3–1.2)
Total Protein: 7.1 g/dL (ref 6.0–8.3)

## 2014-08-24 LAB — I-STAT CG4 LACTIC ACID, ED: Lactic Acid, Venous: 1.7 mmol/L (ref 0.5–2.2)

## 2014-08-24 MED ORDER — SODIUM CHLORIDE 0.9 % IV SOLN
Freq: Once | INTRAVENOUS | Status: AC
  Administered 2014-08-24: via INTRAVENOUS

## 2014-08-24 MED ORDER — PIPERACILLIN-TAZOBACTAM 3.375 G IVPB
3.3750 g | Freq: Once | INTRAVENOUS | Status: AC
  Administered 2014-08-24: 3.375 g via INTRAVENOUS
  Filled 2014-08-24: qty 50

## 2014-08-24 MED ORDER — VANCOMYCIN HCL IN DEXTROSE 1-5 GM/200ML-% IV SOLN
1000.0000 mg | Freq: Once | INTRAVENOUS | Status: AC
Start: 2014-08-24 — End: 2014-08-25
  Administered 2014-08-25: 1000 mg via INTRAVENOUS
  Filled 2014-08-24: qty 200

## 2014-08-24 NOTE — ED Provider Notes (Signed)
CSN: [id number]     Arrival date & time 08/24/14  [id number] History   First MD Initiated Contact with Patient 08/24/14 2306     Chief Complaint  Patient presents with  . Foot Pain     (Consider location/radiation/quality/duration/timing/severity/associated sxs/prior Treatment) Patient is a 58 y.o. male presenting with lower extremity pain. The history is provided by the patient.  Foot Pain Pertinent negatives include no chest pain, no abdominal pain, no headaches and no shortness of breath.  pt with hx iddm, presents w open foot wound to lateral aspect of the right foot for the past week. Symptoms persistent, constant. At baseline, diabetic neuropathy, so states doesn't feel much pain in foot. In past day spreading redness up foot to lower leg and malodor to wound. Went to foot doctor today and was told to go to ED.  Denies fever or chills. nv the past couple days but none today.     Past Medical History  Diagnosis Date  . Carotid artery occlusion 11/10/10    LEFT CAROTID ENDARTERECTOMY  . Diabetes mellitus   . Hypertension   . Hyperlipidemia   . COPD (chronic obstructive pulmonary disease)   . DJD (degenerative joint disease)   . GERD (gastroesophageal reflux disease)   . Substernal chest pain   . 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 Slurred speech     AS PER WIFE IN D/C NOTE 11/10/10  . PAD (peripheral artery disease)     Distal aortogram June 2012. Atherectomy left popliteal artery July 2012.   . Barrett's esophagus   . Fatty liver   . Diverticulosis of colon (without mention of hemorrhage)   . Emphysema of lung   . Osteomyelitis   . Complication of anesthesia     BP WENT UP AT [facility] "  . Sleep apnea     has not used CPAP in e years - took self off  . H/O hiatal hernia   . Neuromuscular disorder     peripheral neuropathy  . Full dentures   . Wears glasses    Past Surgical History  Procedure Laterality Date  . Laminectomy      X 3 LUMBAR AND X 2  CERVICAL SPINE OPERATIONS  . Carotid endarterectomy  11/10/10  . Hernia repair      LEFT INGUINAL AND UMBILICAL REPAIRS  . Anterior fusion cervical spine  02/06/06    C4-5, C5-6, C6-7; SURGEON DR. MAX COHEN  . Penile prosthesis implant  08/14/05    INFRAPUBIC INSERTION OF INFLATABLE PENILE PROSTHESIS; SURGEON DR. Amalia Hailey  . Laparoscopic cholecystectomy w/ cholangiography  11/09/04    SURGEON DR. Luella Cook  . Total knee arthroplasty  07/2002    RIGHT KNEE ; SURGEON  DR. [name] ALSO HAD ARTHROSCOPIC RIGHT KNEE IN  10/2001  . Shoulder arthroscopy    . Cholecystectomy    . Amputation  11/05/2011    Procedure: AMPUTATION RAY;  Surgeon: Wylene Simmer, MD;  Location: [place];  Service: Orthopedics;  Laterality: Right;  Amputation of Right 4&5th Toes  . Femoral artery stent      x6  . Amputation Left 11/26/2012    Procedure: AMPUTATION RAY;  Surgeon: Wylene Simmer, MD;  Location: [place];  Service: Orthopedics;  Laterality: Left;  fourth ray amputation  . Cardiac catheterization  10/31/04    2009  . Carpal tunnel release Right 10/21/2013    Procedure: RIGHT CARPAL TUNNEL RELEASE;  Surgeon: Dillard Essex  Fredna Dow, MD;  Location: [place];  Service: Orthopedics;  Laterality: Right;  . Ulnar nerve transposition Right 10/21/2013    Procedure: RIGHT ELBOW  ULNAR NERVE DECOMPRESSION;  Surgeon: Wynonia Sours, MD;  Location: [place];  Service: Orthopedics;  Laterality: Right;  . Joint replacement Right 2001    Total  . Spine surgery    . Neck surgery    . Esophageal manometry Bilateral 07/19/2014    Procedure: ESOPHAGEAL MANOMETRY (EM);  Surgeon: Jerene Bears, MD;  Location: [facility];  Service: Gastroenterology;  Laterality: Bilateral;  . Lower extremity angiogram N/A 03/19/2012    Procedure: LOWER EXTREMITY ANGIOGRAM;  Surgeon: Burnell Blanks, MD;  Location: [facility] C[facility] LAB;  Service: Cardiovascular;  Laterality: N/A;   Family History  Problem Relation Age of Onset  . Heart disease  Father     Before age 6-  CAD, BPG  . Diabetes Father     Amputation  . Cancer Father     PROSTATE  . Hyperlipidemia Father   . Hypertension Father   . Heart attack Father     Triple BPG  . Arthritis      GRANDMOTHER  . Hypertension      OTHER FAMILY MEMBERS  . Colon cancer Brother   . Cancer Brother     Colon  . Diabetes Brother   . Heart disease Brother 40    A-Fib. Before age 30  . Hyperlipidemia Brother   . Hypertension Brother   . Cancer Sister     Breast   History  Substance Use Topics  . Smoking status: Former Smoker -- 0.50 packs/day for 35 years    Types: Cigarettes    Quit date: 10/31/2011  . Smokeless tobacco: Current User     Comment: VAPOR CIGARETTES  . Alcohol Use: No     Comment: ONCE IN A WHILE    Review of Systems  Constitutional: Negative for fever and chills.  HENT: Negative for sore throat.   Eyes: Negative for redness.  Respiratory: Negative for shortness of breath.   Cardiovascular: Negative for chest pain.  Gastrointestinal: Negative for abdominal pain.  Endocrine: Negative for polydipsia and polyuria.  Genitourinary: Negative for flank pain.  Musculoskeletal: Negative for back pain and neck pain.  Skin: Negative for rash.  Neurological: Negative for headaches.  Hematological: Does not bruise/bleed easily.  Psychiatric/Behavioral: Negative for confusion.      Allergies  Ivp dye and Adhesive  Home Medications   Prior to Admission medications   Medication Sig Start Date End Date Taking? Authorizing Provider  ALPRAZolam Duanne Moron) 0.5 MG tablet Take 0.25 mg by mouth 3 (three) times daily.    Yes Historical Provider, MD  aspirin EC 81 MG tablet Take 81 mg by mouth daily.   Yes Historical Provider, MD  atorvastatin (LIPITOR) 40 MG tablet Take 40 mg by mouth daily.   Yes Historical Provider, MD  AZOR 5-20 MG per tablet Take 1 tablet by mouth daily.  07/29/14  Yes Historical Provider, MD  B Complex-C (B-COMPLEX WITH VITAMIN C) tablet Take  1 tablet by mouth daily.   Yes Historical Provider, MD  carvedilol (COREG) 25 MG tablet Take 25 mg by mouth every morning.    Yes Historical Provider, MD  carvedilol (COREG) 6.25 MG tablet Take 6.25 mg by mouth every evening.    Yes Historical Provider, MD  clopidogrel (PLAVIX) 75 MG tablet Take 75 mg by mouth daily. 1 tab daily 03/28/12  Yes  Historical Provider, MD  ezetimibe (ZETIA) 10 MG tablet Take 10 mg by mouth every morning.   Yes Historical Provider, MD  fentaNYL (DURAGESIC - DOSED MCG/HR) 50 MCG/HR Place 1 patch onto the skin every 3 (three) days.   Yes Historical Provider, MD  furosemide (LASIX) 80 MG tablet Take 80 mg by mouth daily.   Yes Historical Provider, MD  glyBURIDE-metformin (GLUCOVANCE) 2.5-500 MG per tablet Take 2 tablets by mouth 2 (two) times daily.  03/05/12  Yes Historical Provider, MD  HYDROmorphone (DILAUDID) 2 MG tablet Take 1 tablet (2 mg total) by mouth every 4 (four) hours as needed for pain. 11/27/12  Yes Ripudeep Krystal Eaton, MD  insulin glargine (LANTUS) 100 UNIT/ML injection Inject 50 Units into the skin at bedtime.    Yes Historical Provider, MD  insulin glulisine (APIDRA) 100 UNIT/ML injection Inject 10-20 Units into the skin 3 (three) times daily before meals. 10 breakfast 10 lunch and 20 supper   Yes Historical Provider, MD  lansoprazole (PREVACID) 30 MG capsule Take 30 mg by mouth 2 (two) times daily.   Yes Historical Provider, MD  Linaclotide Rolan Lipa) 145 MCG CAPS capsule Take 1 capsule (145 mcg total) by mouth daily. 08/09/14  Yes Jerene Bears, MD  Multiple Vitamin (MULTIVITAMIN) tablet Take 1 tablet by mouth daily.   Yes Historical Provider, MD  nitroGLYCERIN (NITROSTAT) 0.4 MG SL tablet Place 1 tablet (0.4 mg total) under the tongue every 5 (five) minutes as needed (as needed for esophageal spasm). 08/09/14  Yes Jerene Bears, MD  omega-3 acid ethyl esters (LOVAZA) 1 G capsule Take 2,000 mg by mouth 2 (two) times daily.    Yes Historical Provider, MD  ondansetron  (ZOFRAN-ODT) 4 MG disintegrating tablet Take 4 mg by mouth 4 (four) times daily as needed for nausea.   Yes Historical Provider, MD  oxyCODONE-acetaminophen (PERCOCET/ROXICET) 5-325 MG per tablet Take 1 tablet by mouth every 8 (eight) hours as needed for pain.   Yes Historical Provider, MD  pregabalin (LYRICA) 100 MG capsule Take 100 mg by mouth 3 (three) times daily.   Yes Historical Provider, MD  rOPINIRole (REQUIP) 2 MG tablet Take 2 mg by mouth at bedtime.    Yes Historical Provider, MD  Tamsulosin HCl (FLOMAX) 0.4 MG CAPS Take 0.4 mg by mouth daily.   Yes Historical Provider, MD  pregabalin (LYRICA) 75 MG capsule Take 75 mg by mouth 3 (three) times daily.     Historical Provider, MD   BP 189/66 mmHg  Pulse 78  Temp(Src) 98.2 F (36.8 C) (Oral)  Resp 20  Ht 6\' 2"  (1.88 m)  Wt 250 lb (113.399 kg)  BMI 32.08 kg/m2  SpO2 100% Physical Exam  Constitutional: He is oriented to person, place, and time. He appears well-developed and well-nourished. No distress.  HENT:  Mouth/Throat: Oropharynx is clear and moist.  Eyes: Conjunctivae are normal.  Neck: Neck supple. No tracheal deviation present.  Cardiovascular: Normal rate, regular rhythm, normal heart sounds and intact distal pulses.   Pulmonary/Chest: Effort normal and breath sounds normal. No accessory muscle usage. No respiratory distress.  Abdominal: He exhibits no distension. There is no tenderness.  Genitourinary:  No cva tenderness  Musculoskeletal: Normal range of motion. He exhibits no edema or tenderness.  Infected diabetic foot wound on lateral aspect right foot, +malodor, +cellulitis. Distal pulses palp, normal cap refill distally. No crepitus. No gangrenous tissue noted.   Neurological: He is alert and oriented to person, place, and time.  Skin:  Skin is warm and dry. He is not diaphoretic.  Psychiatric: He has a normal mood and affect.  Nursing note and vitals reviewed.   ED Course  Procedures (including critical care  time) Labs Review   Results for orders placed or performed during the hospital encounter of 08/24/14  CBC with Differential  Result Value Ref Range   WBC 8.5 4.0 - 10.5 K/uL   RBC 3.76 (L) 4.22 - 5.81 MIL/uL   Hemoglobin 11.5 (L) 13.0 - 17.0 g/dL   HCT 31.6 (L) 39.0 - 52.0 %   MCV 84.0 78.0 - 100.0 fL   MCH 30.6 26.0 - 34.0 pg   MCHC 36.4 (H) 30.0 - 36.0 g/dL   RDW 12.8 11.5 - 15.5 %   Platelets 96 (L) 150 - 400 K/uL   Neutrophils Relative % 75 43 - 77 %   Neutro Abs 6.4 1.7 - 7.7 K/uL   Lymphocytes Relative 16 12 - 46 %   Lymphs Abs 1.3 0.7 - 4.0 K/uL   Monocytes Relative 8 3 - 12 %   Monocytes Absolute 0.7 0.1 - 1.0 K/uL   Eosinophils Relative 1 0 - 5 %   Eosinophils Absolute 0.1 0.0 - 0.7 K/uL   Basophils Relative 0 0 - 1 %   Basophils Absolute 0.0 0.0 - 0.1 K/uL  Comprehensive metabolic panel  Result Value Ref Range   Sodium 135 (L) 137 - 147 mEq/L   Potassium 3.9 3.7 - 5.3 mEq/L   Chloride 95 (L) 96 - 112 mEq/L   CO2 25 19 - 32 mEq/L   Glucose, Bld 224 (H) 70 - 99 mg/dL   BUN 12 6 - 23 mg/dL   Creatinine, Ser 1.07 0.50 - 1.35 mg/dL   Calcium 8.6 8.4 - 10.5 mg/dL   Total Protein 7.1 6.0 - 8.3 g/dL   Albumin 3.8 3.5 - 5.2 g/dL   AST 15 0 - 37 U/L   ALT 11 0 - 53 U/L   Alkaline Phosphatase 125 (H) 39 - 117 U/L   Total Bilirubin 0.6 0.3 - 1.2 mg/dL   GFR calc non Af Amer 74 (L) >90 mL/min   GFR calc Af Amer 85 (L) >90 mL/min   Anion gap 15 5 - 15  I-Stat CG4 Lactic Acid, ED  Result Value Ref Range   Lactic Acid, Venous 1.70 0.5 - 2.2 mmol/L   Dg Foot Complete Right  08/24/2014   CLINICAL DATA:  Wound abscess, lateral right foot for 9 days  EXAM: RIGHT FOOT COMPLETE - 3+ VIEW  COMPARISON:  August 17, 2014  FINDINGS: Frontal, oblique, and lateral views were obtained. The patient has had amputations at the levels of the mid fourth and fifth metatarsals, stable. Soft tissue air distal to the fourth metatarsal appears essentially stable and may represent soft tissue  infection. No well-defined abscess is not seen radiographically. There is no acute fracture or dislocation. There is advanced osteoarthritic change in the first MTP joint. There is moderate narrowing of all remaining PIP and DIP joints, stable. There is osteoarthritic change in the dorsal midfoot with spurring and narrowing, stable. There are small posterior and inferior calcaneal spurs. No demonstrable osteomyelitis.  IMPRESSION: Areas of extensive osteoarthritic change, stable. Amputation at the mid fourth and fifth metatarsal levels. No bony destruction seen. Soft tissue air distal to the fourth metatarsal may represent soft tissue infection; well-defined abscess not seen.  If there remains concern for osteomyelitis clinically, MR or nuclear medicine three-phase bone scan potentially  could be helpful to further assess.   Electronically Signed   By: Lowella Grip M.D.   On: 08/24/2014 21:52   Dg Foot Complete Right  08/17/2014   CLINICAL DATA:  Foot pain. Infection in the right lateral foot. Previous amputations and diabetic complications.  EXAM: RIGHT FOOT COMPLETE - 3+ VIEW  COMPARISON:  04/20/2014  FINDINGS: Previous amputations of the right fourth and fifth toes at the mid metatarsal region. There is interval development of soft tissue lucency distal to the fourth metatarsal stump. This suggests soft tissue infection. There is no evidence of bone erosion or bone irregularity. No findings to suggest osteomyelitis. Prominent degenerative changes in the first metatarsal-phalangeal joint with severe loss of joint space and prominent hypertrophic osteophytes. No acute fracture or dislocation. Degenerative changes in the intertarsal joints.  IMPRESSION: Previous mid metatarsal fourth and fifth toe amputations. Soft tissue gas collection distal to the fourth metatarsal consistent with infection. No changes to suggest osteomyelitis. Severe degenerative changes in the first metatarsal-phalangeal joint.    Electronically Signed   By: Lucienne Capers M.D.   On: 08/17/2014 03:40      MDM   Iv ns.   Labs.  Xrays.  Reviewed nursing notes and prior charts for additional history.   vanc and zosyn iv.   Given odor, spreading redness, infected diabetic foot wound, will admit.  hospitalists called for admission.    Mirna Mires, MD 08/24/14 [phone]

## 2014-08-24 NOTE — ED Notes (Addendum)
Pt in c/o infection to his right foot, states he was seen here recently for same, states infection has spread from his toes and is on the side of his foot, sent here by [facility] for further treatment, pt also reports n/v/d in the last few days, unsure of fever

## 2014-08-24 NOTE — Progress Notes (Signed)
   Subjective:    Patient ID: [name], male    DOB: 1956/03/17, 58 y.o.   MRN: [id number]  HPI MY RIGHT SIDE OF MY FOOT HAS A HOLE IN IT AND IT STARTED LAST Friday NIGHT AND BY Sunday I WAS IN THE EMERGENCY ROOM AND THEY STATED THAT IT WAS NOT INFECTED AND WAS GOING TO DO AN IV ON ME BUT INSTEAD GAVE ME MEDICINE AND THE SKIN HAS COME OFF AND IS RED, DR HEWITT WAS TO CALL ME BACK BUT HAS NOT YET AND YESTERDAY WAS HURTING AND DRAINING AND I DID HAVE SOME BLOOD AND I HAVE BEEN THROWING UP AND I HAVE DIARRHEA AND I HAD THE 4TH TOE ON MY LEFT FOOT TAKEN OFF IN 2014 AND MY 4TH AND 5TH ON MY RIGHT DUE TO OSTEOMYELITIS     Review of Systems patient does have a history of complications of diabetic neuropathy PT she is of toes on both feet are noted currently running a localized cellulitis fever to the right foot. There is an open necrotic ulceration with malodor and drainage.     Objective:   Physical Exam 58 year old white male well-developed well-nourished oriented 3 at this time presents with a complaint of open sore lateral right foot with to the emergency room this past Sunday was told the foot was not infected and was not placed on any antibiotics and discharged from the ER with just a dressing apparently was supposed have document follow-up however not heard from him presents at this time with an open ulceration approximately 2-3 cm in overall diameter lateral fifth metatarsal head area of the right foot there is necrotic tissue and skin with friable tissue and ulceration which probes down to bone and capsule of the fifth metatarsal and malodor with bloody serosanguineous discharge and drainage. The right foot and leg are extremely warm to touch as compared to the contralateral left foot no x-rays taken at this time had x-rays done at the hospital earlier are patient is advised x-rays may not show bone infection for up to 2 or 3 weeks and it more appropriate testing would be an MRI or bone scan.  Patient is profoundly neuropathic pedal pulses are intact with thready DP and PT plus one over 4 bilateral mild edema noted mild varicosities noted there is profound absence of sensation Semmes Weinstein testing. It's painful tender symptomatically despite typically being insensate these findings are consistent with possibly likely osteomyelitis and bone exposure the fifth metatarsal. Gangrenous changes of the skin overlying fifth metatarsal are noted.       Assessment & Plan:  Assessment diabetic neuropathic ulcer with possible some ischemic compromise as well patient needs to be admitted with IV antibiotic therapy possible evaluation with MRI and likely needs orthopedic consult for surgical debridement show ray resection may be necessary. Patient was sent immediately to the emergency room at this time very low-grade temperature was identified however the foot itself is extremely hot to the touch with localized cellulitis being present again malodor and exposure of bone and necrotic tissue consistent with gangrenous ulceration fifth toe area right foot. At this time the areas cleansed with all cleanse Iodosorb and gauze dressing are applied however patient is advised to go immediately to the emergency room may stop at home to get a change of clothing  Harriet Masson DPM

## 2014-08-24 NOTE — Telephone Encounter (Signed)
Dr. Blenda Mounts states call ahead to [facility] informing pt will be arriving by private vehicle for treatment of gangrenous right foot ulcer, that probes toe the bone with malodor and local fever.  Information was called to Santiago Glad [id number] at 1140am and I informed her the office notes would be available when pt arrived.

## 2014-08-25 ENCOUNTER — Encounter (HOSPITAL_COMMUNITY): Payer: Self-pay | Admitting: Internal Medicine

## 2014-08-25 ENCOUNTER — Inpatient Hospital Stay (HOSPITAL_COMMUNITY): Payer: BC Managed Care – PPO

## 2014-08-25 DIAGNOSIS — I10 Essential (primary) hypertension: Secondary | ICD-10-CM

## 2014-08-25 DIAGNOSIS — L03115 Cellulitis of right lower limb: Secondary | ICD-10-CM | POA: Diagnosis present

## 2014-08-25 DIAGNOSIS — E119 Type 2 diabetes mellitus without complications: Secondary | ICD-10-CM

## 2014-08-25 DIAGNOSIS — E785 Hyperlipidemia, unspecified: Secondary | ICD-10-CM

## 2014-08-25 LAB — COMPREHENSIVE METABOLIC PANEL
ALT: 10 U/L (ref 0–53)
AST: 10 U/L (ref 0–37)
Albumin: 3.4 g/dL — ABNORMAL LOW (ref 3.5–5.2)
Alkaline Phosphatase: 114 U/L (ref 39–117)
Anion gap: 12 (ref 5–15)
BUN: 14 mg/dL (ref 6–23)
CO2: 28 mEq/L (ref 19–32)
Calcium: 8.6 mg/dL (ref 8.4–10.5)
Chloride: 98 mEq/L (ref 96–112)
Creatinine, Ser: 1.18 mg/dL (ref 0.50–1.35)
GFR calc Af Amer: 76 mL/min — ABNORMAL LOW (ref >90)
GFR calc non Af Amer: 65 mL/min — ABNORMAL LOW (ref >90)
Glucose, Bld: 150 mg/dL — ABNORMAL HIGH (ref 70–99)
Potassium: 3.2 mEq/L — ABNORMAL LOW (ref 3.7–5.3)
Sodium: 138 mEq/L (ref 137–147)
Total Bilirubin: 0.5 mg/dL (ref 0.3–1.2)
Total Protein: 6.6 g/dL (ref 6.0–8.3)

## 2014-08-25 LAB — CBC WITH DIFFERENTIAL/PLATELET
Basophils Absolute: 0 10*3/uL (ref 0.0–0.1)
Basophils Relative: 0 % (ref 0–1)
Eosinophils Absolute: 0.1 10*3/uL (ref 0.0–0.7)
Eosinophils Relative: 2 % (ref 0–5)
HCT: 30.3 % — ABNORMAL LOW (ref 39.0–52.0)
Hemoglobin: 10.8 g/dL — ABNORMAL LOW (ref 13.0–17.0)
Lymphocytes Relative: 18 % (ref 12–46)
Lymphs Abs: 1.4 10*3/uL (ref 0.7–4.0)
MCH: 29.5 pg (ref 26.0–34.0)
MCHC: 35.6 g/dL (ref 30.0–36.0)
MCV: 82.8 fL (ref 78.0–100.0)
Monocytes Absolute: 0.8 10*3/uL (ref 0.1–1.0)
Monocytes Relative: 11 % (ref 3–12)
Neutro Abs: 5.5 10*3/uL (ref 1.7–7.7)
Neutrophils Relative %: 70 % (ref 43–77)
Platelets: 184 10*3/uL (ref 150–400)
RBC: 3.66 MIL/uL — ABNORMAL LOW (ref 4.22–5.81)
RDW: 12.8 % (ref 11.5–15.5)
WBC: 7.8 10*3/uL (ref 4.0–10.5)

## 2014-08-25 LAB — GLUCOSE, CAPILLARY
Glucose-Capillary: 149 mg/dL — ABNORMAL HIGH (ref 70–99)
Glucose-Capillary: 182 mg/dL — ABNORMAL HIGH (ref 70–99)
Glucose-Capillary: 215 mg/dL — ABNORMAL HIGH (ref 70–99)
Glucose-Capillary: 184 mg/dL — ABNORMAL HIGH (ref 70–99)

## 2014-08-25 LAB — CBG MONITORING, ED: Glucose-Capillary: 194 mg/dL — ABNORMAL HIGH (ref 70–99)

## 2014-08-25 LAB — SEDIMENTATION RATE: Sed Rate: 38 mm/hr — ABNORMAL HIGH (ref 0–16)

## 2014-08-25 MED ORDER — CARVEDILOL 6.25 MG PO TABS
6.2500 mg | ORAL_TABLET | Freq: Every evening | ORAL | Status: DC
  Administered 2014-08-25 – 2014-08-28 (×4): 6.25 mg via ORAL
  Filled 2014-08-25 (×4): qty 1

## 2014-08-25 MED ORDER — FENTANYL 25 MCG/HR TD PT72
50.0000 ug | MEDICATED_PATCH | TRANSDERMAL | Status: DC
  Administered 2014-08-26: 50 ug via TRANSDERMAL
  Filled 2014-08-25: qty 2

## 2014-08-25 MED ORDER — AMLODIPINE BESYLATE 5 MG PO TABS
5.0000 mg | ORAL_TABLET | Freq: Every day | ORAL | Status: DC
  Administered 2014-08-25 – 2014-08-27 (×4): 5 mg via ORAL
  Filled 2014-08-25 (×4): qty 1

## 2014-08-25 MED ORDER — ONDANSETRON HCL 4 MG/2ML IJ SOLN: 4.0000 mg | Freq: Four times a day (QID) | INTRAMUSCULAR | Status: DC | PRN

## 2014-08-25 MED ORDER — SODIUM CHLORIDE 0.9 % IJ SOLN
3.0000 mL | Freq: Two times a day (BID) | INTRAMUSCULAR | Status: DC
  Administered 2014-08-28: 3 mL via INTRAVENOUS

## 2014-08-25 MED ORDER — IRBESARTAN 150 MG PO TABS
150.0000 mg | ORAL_TABLET | Freq: Every day | ORAL | Status: DC
  Administered 2014-08-25 – 2014-08-27 (×4): 150 mg via ORAL
  Filled 2014-08-25 (×4): qty 1

## 2014-08-25 MED ORDER — NITROGLYCERIN 0.4 MG SL SUBL: 0.4000 mg | SUBLINGUAL_TABLET | SUBLINGUAL | Status: DC | PRN

## 2014-08-25 MED ORDER — OMEGA-3-ACID ETHYL ESTERS 1 G PO CAPS
2000.0000 mg | ORAL_CAPSULE | Freq: Two times a day (BID) | ORAL | Status: DC
  Administered 2014-08-25 – 2014-08-28 (×7): 2000 mg via ORAL
  Filled 2014-08-25 (×7): qty 2

## 2014-08-25 MED ORDER — PREGABALIN 50 MG PO CAPS
100.0000 mg | ORAL_CAPSULE | Freq: Three times a day (TID) | ORAL | Status: DC
  Administered 2014-08-25 – 2014-08-28 (×11): 100 mg via ORAL
  Filled 2014-08-25 (×11): qty 2

## 2014-08-25 MED ORDER — VANCOMYCIN HCL IN DEXTROSE 1-5 GM/200ML-% IV SOLN
1000.0000 mg | Freq: Three times a day (TID) | INTRAVENOUS | Status: DC
  Administered 2014-08-25 – 2014-08-28 (×12): 1000 mg via INTRAVENOUS
  Filled 2014-08-25 (×13): qty 200

## 2014-08-25 MED ORDER — ENOXAPARIN SODIUM 40 MG/0.4ML ~~LOC~~ SOLN
40.0000 mg | SUBCUTANEOUS | Status: DC
  Administered 2014-08-25 – 2014-08-28 (×3): 40 mg via SUBCUTANEOUS
  Filled 2014-08-25 (×3): qty 0.4

## 2014-08-25 MED ORDER — ACETAMINOPHEN 650 MG RE SUPP: 650.0000 mg | Freq: Four times a day (QID) | RECTAL | Status: DC | PRN

## 2014-08-25 MED ORDER — CLOPIDOGREL BISULFATE 75 MG PO TABS
75.0000 mg | ORAL_TABLET | Freq: Every day | ORAL | Status: DC
  Administered 2014-08-25 – 2014-08-28 (×4): 75 mg via ORAL
  Filled 2014-08-25 (×4): qty 1

## 2014-08-25 MED ORDER — ALPRAZOLAM 0.25 MG PO TABS
0.2500 mg | ORAL_TABLET | Freq: Three times a day (TID) | ORAL | Status: DC
  Administered 2014-08-25 – 2014-08-28 (×10): 0.25 mg via ORAL
  Filled 2014-08-25 (×10): qty 1

## 2014-08-25 MED ORDER — IRBESARTAN 150 MG PO TABS: 150.0000 mg | ORAL_TABLET | Freq: Every day | ORAL | Status: DC

## 2014-08-25 MED ORDER — ONDANSETRON HCL 4 MG PO TABS: 4.0000 mg | ORAL_TABLET | Freq: Four times a day (QID) | ORAL | Status: DC | PRN

## 2014-08-25 MED ORDER — INSULIN GLARGINE 100 UNIT/ML ~~LOC~~ SOLN
50.0000 [IU] | Freq: Every day | SUBCUTANEOUS | Status: DC
Start: 2014-08-25 — End: 2014-08-28
  Administered 2014-08-25 – 2014-08-27 (×4): 50 [IU] via SUBCUTANEOUS
  Filled 2014-08-25 (×5): qty 0.5

## 2014-08-25 MED ORDER — TAMSULOSIN HCL 0.4 MG PO CAPS
0.4000 mg | ORAL_CAPSULE | Freq: Every day | ORAL | Status: DC
  Administered 2014-08-25 – 2014-08-28 (×4): 0.4 mg via ORAL
  Filled 2014-08-25 (×4): qty 1

## 2014-08-25 MED ORDER — CARVEDILOL 12.5 MG PO TABS
25.0000 mg | ORAL_TABLET | Freq: Every morning | ORAL | Status: DC
  Administered 2014-08-27 – 2014-08-28 (×2): 25 mg via ORAL
  Filled 2014-08-25 (×3): qty 2

## 2014-08-25 MED ORDER — EZETIMIBE 10 MG PO TABS
10.0000 mg | ORAL_TABLET | Freq: Every morning | ORAL | Status: DC
  Administered 2014-08-25 – 2014-08-28 (×4): 10 mg via ORAL
  Filled 2014-08-25 (×4): qty 1

## 2014-08-25 MED ORDER — GADOBENATE DIMEGLUMINE 529 MG/ML IV SOLN
20.0000 mL | Freq: Once | INTRAVENOUS | Status: AC | PRN
  Administered 2014-08-25: 20 mL via INTRAVENOUS

## 2014-08-25 MED ORDER — INSULIN ASPART 100 UNIT/ML ~~LOC~~ SOLN
0.0000 [IU] | Freq: Three times a day (TID) | SUBCUTANEOUS | Status: DC
Start: 2014-08-25 — End: 2014-08-28
  Administered 2014-08-25: 1 [IU] via SUBCUTANEOUS
  Administered 2014-08-25: 3 [IU] via SUBCUTANEOUS
  Administered 2014-08-25: 2 [IU] via SUBCUTANEOUS
  Administered 2014-08-26: 1 [IU] via SUBCUTANEOUS
  Administered 2014-08-26: 3 [IU] via SUBCUTANEOUS
  Administered 2014-08-26: 1 [IU] via SUBCUTANEOUS
  Administered 2014-08-28 (×2): 2 [IU] via SUBCUTANEOUS
  Administered 2014-08-28: 1 [IU] via SUBCUTANEOUS

## 2014-08-25 MED ORDER — DEXTROSE 5 % IV SOLN
1.0000 g | Freq: Three times a day (TID) | INTRAVENOUS | Status: DC
  Administered 2014-08-25 – 2014-08-28 (×11): 1 g via INTRAVENOUS
  Filled 2014-08-25 (×13): qty 1

## 2014-08-25 MED ORDER — ROPINIROLE HCL 1 MG PO TABS
2.0000 mg | ORAL_TABLET | Freq: Every day | ORAL | Status: DC
  Administered 2014-08-25 – 2014-08-27 (×4): 2 mg via ORAL
  Filled 2014-08-25 (×4): qty 2

## 2014-08-25 MED ORDER — GLYBURIDE 5 MG PO TABS
5.0000 mg | ORAL_TABLET | Freq: Two times a day (BID) | ORAL | Status: DC
  Administered 2014-08-25 – 2014-08-28 (×7): 5 mg via ORAL
  Filled 2014-08-25 (×10): qty 1

## 2014-08-25 MED ORDER — PANTOPRAZOLE SODIUM 40 MG PO TBEC
40.0000 mg | DELAYED_RELEASE_TABLET | Freq: Every day | ORAL | Status: DC
  Administered 2014-08-25 – 2014-08-28 (×4): 40 mg via ORAL
  Filled 2014-08-25 (×4): qty 1

## 2014-08-25 MED ORDER — FUROSEMIDE 80 MG PO TABS
80.0000 mg | ORAL_TABLET | Freq: Every day | ORAL | Status: DC
  Administered 2014-08-25 – 2014-08-28 (×4): 80 mg via ORAL
  Filled 2014-08-25 (×4): qty 1

## 2014-08-25 MED ORDER — ACETAMINOPHEN 325 MG PO TABS: 650.0000 mg | ORAL_TABLET | Freq: Four times a day (QID) | ORAL | Status: DC | PRN

## 2014-08-25 MED ORDER — GLYBURIDE 2.5 MG PO TABS
2.5000 mg | ORAL_TABLET | Freq: Every day | ORAL | Status: DC
  Filled 2014-08-25: qty 1

## 2014-08-25 MED ORDER — AMLODIPINE BESYLATE 5 MG PO TABS: 5.0000 mg | ORAL_TABLET | Freq: Every day | ORAL | Status: DC

## 2014-08-25 MED ORDER — PREGABALIN 50 MG PO CAPS: 100.0000 mg | ORAL_CAPSULE | Freq: Three times a day (TID) | ORAL | Status: DC

## 2014-08-25 MED ORDER — CARVEDILOL 6.25 MG PO TABS: 6.2500 mg | ORAL_TABLET | Freq: Every evening | ORAL | Status: DC

## 2014-08-25 MED ORDER — POTASSIUM CHLORIDE CRYS ER 20 MEQ PO TBCR
40.0000 meq | EXTENDED_RELEASE_TABLET | Freq: Once | ORAL | Status: AC
  Administered 2014-08-25: 40 meq via ORAL
  Filled 2014-08-25: qty 2

## 2014-08-25 MED ORDER — AMLODIPINE-OLMESARTAN 5-20 MG PO TABS: 1.0000 | ORAL_TABLET | Freq: Every day | ORAL | Status: DC

## 2014-08-25 MED ORDER — ATORVASTATIN CALCIUM 40 MG PO TABS
40.0000 mg | ORAL_TABLET | Freq: Every day | ORAL | Status: DC
  Administered 2014-08-25 – 2014-08-28 (×4): 40 mg via ORAL
  Filled 2014-08-25 (×4): qty 1

## 2014-08-25 MED ORDER — LINACLOTIDE 145 MCG PO CAPS
145.0000 ug | ORAL_CAPSULE | Freq: Every day | ORAL | Status: DC
  Administered 2014-08-25 – 2014-08-28 (×4): 145 ug via ORAL
  Filled 2014-08-25 (×4): qty 1

## 2014-08-25 MED ORDER — ASPIRIN EC 81 MG PO TBEC
81.0000 mg | DELAYED_RELEASE_TABLET | Freq: Every day | ORAL | Status: DC
  Administered 2014-08-25 – 2014-08-28 (×4): 81 mg via ORAL
  Filled 2014-08-25 (×4): qty 1

## 2014-08-25 MED ORDER — HYDROMORPHONE HCL 1 MG/ML IJ SOLN
1.0000 mg | INTRAMUSCULAR | Status: DC | PRN
  Administered 2014-08-25 – 2014-08-28 (×11): 1 mg via INTRAVENOUS
  Filled 2014-08-25 (×12): qty 1

## 2014-08-25 NOTE — Progress Notes (Signed)
[facility] PROGRESS NOTE  [name] [id number] DOB: Sep 25, 1955 DOA: 08/24/2014  PCP: [name], MD  Brief HPI: 58 year old Caucasian male with the history of diabetes, peripheral artery disease, hypertension, who presented with redness and ulceration in the lateral aspect of his right foot starting about 10 days prior to admission. He was referred to the emergency department by his podiatrist. There was a concern that he might have osteomyelitis along with cellulitis.  Past medical history:  Past Medical History  Diagnosis Date  . Carotid artery occlusion 11/10/10    LEFT CAROTID ENDARTERECTOMY  . Diabetes mellitus   . Hypertension   . Hyperlipidemia   . COPD (chronic obstructive pulmonary disease)   . DJD (degenerative joint disease)   . GERD (gastroesophageal reflux disease)   . Substernal chest pain   . 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 Slurred speech     AS PER WIFE IN D/C NOTE 11/10/10  . PAD (peripheral artery disease)     Distal aortogram June 2012. Atherectomy left popliteal artery July 2012.   . Barrett's esophagus   . Fatty liver   . Diverticulosis of colon (without mention of hemorrhage)   . Emphysema of lung   . Osteomyelitis   . Complication of anesthesia     BP WENT UP AT [facility] "  . Sleep apnea     has not used CPAP in e years - took self off  . H/O hiatal hernia   . Neuromuscular disorder     peripheral neuropathy  . Full dentures   . Wears glasses     Consultants: None  Procedures: None  Antibiotics: Vanc and Cefepime 12/15-->  Subjective: Patient complains of 4 out of 10 pain in that the right foot. Pain gets worse when he has to ambulate. Denies any other complaints.  Objective: Vital Signs  Filed Vitals:   08/24/14 1955 08/24/14 2330 08/24/14 2357 08/25/14 1006  BP: 189/66 171/70 145/59 145/65  Pulse: 78 67 69 55  Temp: 98.2 F (36.8 C)   99 F (37.2 C)  TempSrc: Oral   Oral  Resp: 20   18 18   Height: 6\' 2"  (1.88 m)     Weight: 113.399 kg (250 lb)     SpO2: 100% 100% 100% 99%    Intake/Output Summary (Last 24 hours) at 08/25/14 1038 Last data filed at 08/25/14 0140  Gross per 24 hour  Intake      0 ml  Output    700 ml  Net   -700 ml   Filed Weights   08/24/14 1955  Weight: 113.399 kg (250 lb)    General appearance: alert, cooperative, appears stated age and no distress Resp: clear to auscultation bilaterally Cardio: regular rate and rhythm, S1, S2 normal, no murmur, click, rub or gallop GI: soft, non-tender; bowel sounds normal; no masses,  no organomegaly Extremities: There is a ulcer measuring about 3-4 cm in diameter on the lateral aspect of the right foot. No active drainage is noted. Some yellowish exudate is seen. There is slight erythema surrounding it. Tender to palpation.  Lab Results:  Basic Metabolic Panel:  Recent Labs Lab 08/24/14 2013 08/25/14 0739  NA 135* 138  K 3.9 3.2*  CL 95* 98  CO2 25 28  GLUCOSE 224* 150*  BUN 12 14  CREATININE 1.07 1.18  CALCIUM 8.6 8.6   Liver Function Tests:  Recent Labs Lab 08/24/14 2013 08/25/14 [id number]  AST 15 10  ALT 11 10  ALKPHOS 125* 114  BILITOT 0.6 0.5  PROT 7.1 6.6  ALBUMIN 3.8 3.4*   CBC:  Recent Labs Lab 08/24/14 2013 08/25/14 0739  WBC 8.5 7.8  NEUTROABS 6.4 5.5  HGB 11.5* 10.8*  HCT 31.6* 30.3*  MCV 84.0 82.8  PLT 96* 184   CBG:  Recent Labs Lab 08/25/14 0025 08/25/14 0617  GLUCAP 194* 149*     Studies/Results: Mr Foot Right W Wo Contrast  08/25/2014   CLINICAL DATA:  1 cm soft tissue ulceration and cellulitis at the lateral aspect of the right foot. 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  EXAM: MRI OF THE RIGHT FOREFOOT WITHOUT AND WITH CONTRAST  TECHNIQUE: Multiplanar, multisequence MR imaging was performed both before and after administration of intravenous contrast.  CONTRAST:  80mL MULTIHANCE GADOBENATE  DIMEGLUMINE 529 MG/ML IV SOLN  COMPARISON:  MRI dated 11/01/2011 and radiographs dated 08/24/2014  FINDINGS: There is soft tissue ulceration just lateral to the stump of the fifth metatarsal with an area of devitalized soft tissue measuring approximately 27 mm in diameter. There is enhancing soft tissue around the devitalized area consistent with cellulitis. This extends onto the dorsum of the foot distal to the stump of the fifth metatarsal. There is no bulb new bone destruction or abnormal edema or enhancement in the stump of the fifth metatarsal.  There are no joint effusions. 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  IMPRESSION: 1. Soft tissue ulceration of the lateral aspect of the foot with no deep ulceration or underlying osteomyelitis. There is adjacent cellulitis. 2. The area of devitalized soft tissue are at the site of the ulcer is approximately 2.7 cm in diameter and approximately 9 mm deep.   Electronically Signed   By: Rozetta Nunnery M.D.   On: 08/25/2014 09:35   Dg Foot Complete Right  08/24/2014   CLINICAL DATA:  Wound abscess, lateral right foot for 9 days  EXAM: RIGHT FOOT COMPLETE - 3+ VIEW  COMPARISON:  August 17, 2014  FINDINGS: Frontal, oblique, and lateral views were obtained. The patient has had amputations at the levels of the mid fourth and fifth metatarsals, stable. Soft tissue air distal to the fourth metatarsal appears essentially stable and may represent soft tissue infection. No well-defined abscess is not seen radiographically. There is no acute fracture or dislocation. There is advanced osteoarthritic change in the first MTP joint. There is moderate narrowing of all remaining PIP and DIP joints, stable. There is osteoarthritic change in the dorsal midfoot with spurring and narrowing, stable. There are small posterior and inferior calcaneal spurs. No demonstrable osteomyelitis.   IMPRESSION: Areas of extensive osteoarthritic change, stable. Amputation at the mid fourth and fifth metatarsal levels. No bony destruction seen. Soft tissue air distal to the fourth metatarsal may represent soft tissue infection; well-defined abscess not seen.  If there remains concern for osteomyelitis clinically, MR or nuclear medicine three-phase bone scan potentially could be helpful to further assess.   Electronically Signed   By: Lowella Grip M.D.   On: 08/24/2014 21:52    Medications:  Scheduled: . ALPRAZolam  0.25 mg Oral TID  . amLODipine  5 mg Oral QHS  . aspirin EC  81 mg Oral Daily  . atorvastatin  40 mg Oral Daily  . carvedilol  25 mg Oral q morning -  10a  . carvedilol  6.25 mg Oral QPM  . ceFEPime (MAXIPIME) IV  1 g Intravenous Q8H  . clopidogrel  75 mg Oral Daily  . enoxaparin (LOVENOX) injection  40 mg Subcutaneous Q24H  . ezetimibe  10 mg Oral q morning - 10a  . [START ON 08/26/2014] fentaNYL  50 mcg Transdermal Q72H  . furosemide  80 mg Oral Daily  . glyBURIDE  5 mg Oral BID WC  . insulin aspart  0-9 Units Subcutaneous TID WC  . insulin glargine  50 Units Subcutaneous QHS  . irbesartan  150 mg Oral QHS  . Linaclotide  145 mcg Oral Daily  . omega-3 acid ethyl esters  2,000 mg Oral BID  . pantoprazole  40 mg Oral Daily  . potassium chloride  40 mEq Oral Once  . pregabalin  100 mg Oral TID  . rOPINIRole  2 mg Oral QHS  . sodium chloride  3 mL Intravenous Q12H  . tamsulosin  0.4 mg Oral Daily  . vancomycin  1,000 mg Intravenous Q8H   Continuous:  [id number] **OR** acetaminophen, HYDROmorphone (DILAUDID) injection, nitroGLYCERIN, ondansetron **OR** ondansetron (ZOFRAN) IV  Assessment/Plan:  Principal Problem:   Cellulitis of foot, right Active Problems:   PAD (peripheral artery disease)   Diabetes mellitus type 2, controlled   Essential hypertension   Hyperlipidemia   Cellulitis of foot without toes, right    Diabetic foot ulcer with cellulitis  of the right foot MRI was done this morning and does not she was due myelitis. Sedimentation ratio was only minimally elevated. Continue with intravenous antibiotics for another 24 hours. Get wound care consultation. Hopefully he can be discharged with oral antibiotics in the next 1-2 days with close follow-up with his orthopedic surgeon as an outpatient.  Diabetes mellitus type 2 uncontrolled Continue with the current medication regimen including Lantus and glyburide along with sliding scale coverage. Last HbA1c that we have in our system is from March 2014 and it was 7.6. We will recheck in a.m.  Essential Hypertension Continue home medications.  Hyperlipidemia Continue statins.  Peripheral arterial disease Continue home medications. He has good pulsations in the right dorsalis pedis.  History of OSA Noncompliant with CPAP  Hypokalemia. Will be repleted.  Normocytic anemia. Monitor CBC closely. No overt bleeding.  DVT Prophylaxis: Lovenox    Code Status: Full code  Family Communication: Discussed with the patient  Disposition Plan: Not ready for discharge    LOS: 1 day   [facility] Pager [phone] 08/25/2014, 10:38 AM  If 8PM-8AM, please contact night-coverage at [email or web address], password [facility]

## 2014-08-25 NOTE — Progress Notes (Signed)
ANTIBIOTIC CONSULT NOTE - INITIAL  Pharmacy Consult for Vancomycin and Cefepime  Indication: cellulitis  Allergies  Allergen Reactions  . Ivp Dye [Iodinated Diagnostic Agents] Anaphylaxis    Breathing problems  . Adhesive [Tape] Rash    Patient Measurements: Height: 6\' 2"  (188 cm) Weight: 250 lb (113.399 kg) IBW/kg (Calculated) : 82.2 Adjusted Body Weight: 100 kg  Vital Signs: Temp: 98.2 F (36.8 C) (12/15 1955) Temp Source: Oral (12/15 1955) BP: 145/59 mmHg (12/15 2357) Pulse Rate: 69 (12/15 2357) Intake/Output from previous day:   Intake/Output from this shift:    Labs:  Recent Labs  08/24/14 2013  WBC 8.5  HGB 11.5*  PLT 96*  CREATININE 1.07   Estimated Creatinine Clearance: 98.3 mL/min (by C-G formula based on Cr of 1.07). No results for input(s): VANCOTROUGH, VANCOPEAK, VANCORANDOM, GENTTROUGH, GENTPEAK, GENTRANDOM, TOBRATROUGH, TOBRAPEAK, TOBRARND, AMIKACINPEAK, AMIKACINTROU, AMIKACIN in the last 72 hours.   Microbiology: No results found for this or any previous visit (from the past 720 hour(s)).  Medical History: Past Medical History  Diagnosis Date  . Carotid artery occlusion 11/10/10    LEFT CAROTID ENDARTERECTOMY  . Diabetes mellitus   . Hypertension   . Hyperlipidemia   . COPD (chronic obstructive pulmonary disease)   . DJD (degenerative joint disease)   . GERD (gastroesophageal reflux disease)   . Substernal chest pain   . 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 Slurred speech     AS PER WIFE IN D/C NOTE 11/10/10  . PAD (peripheral artery disease)     Distal aortogram June 2012. Atherectomy left popliteal artery July 2012.   . Barrett's esophagus   . Fatty liver   . Diverticulosis of colon (without mention of hemorrhage)   . Emphysema of lung   . Osteomyelitis   . Complication of anesthesia     BP WENT UP AT [facility] "  . Sleep apnea     has not used CPAP in e years - took self off  . H/O hiatal hernia   .  Neuromuscular disorder     peripheral neuropathy  . Full dentures   . Wears glasses     Medications:  Prescriptions prior to admission  Medication Sig Dispense Refill Last Dose  . ALPRAZolam (XANAX) 0.5 MG tablet Take 0.25 mg by mouth 3 (three) times daily.    08/24/2014 at Unknown time  . aspirin EC 81 MG tablet Take 81 mg by mouth daily.   08/24/2014 at Unknown time  . atorvastatin (LIPITOR) 40 MG tablet Take 40 mg by mouth daily.   08/24/2014 at Unknown time  . AZOR 5-20 MG per tablet Take 1 tablet by mouth daily.   0 08/23/2014 at Unknown time  . B Complex-C (B-COMPLEX WITH VITAMIN C) tablet Take 1 tablet by mouth daily.   Past Month at Unknown time  . carvedilol (COREG) 25 MG tablet Take 25 mg by mouth every morning.    08/24/2014 at 0800  . carvedilol (COREG) 6.25 MG tablet Take 6.25 mg by mouth every evening.    08/23/2014 at 2300  . clopidogrel (PLAVIX) 75 MG tablet Take 75 mg by mouth daily. 1 tab daily   08/24/2014 at Unknown time  . ezetimibe (ZETIA) 10 MG tablet Take 10 mg by mouth every morning.   08/24/2014 at Unknown time  . fentaNYL (DURAGESIC - DOSED MCG/HR) 50 MCG/HR Place 1 patch onto the skin every 3 (three) days.   changed 12/17  pm  . furosemide (LASIX) 80 MG tablet Take 80 mg by mouth daily.   08/24/2014 at Unknown time  . glyBURIDE-metformin (GLUCOVANCE) 2.5-500 MG per tablet Take 2 tablets by mouth 2 (two) times daily.    08/24/2014 at Unknown time  . HYDROmorphone (DILAUDID) 2 MG tablet Take 1 tablet (2 mg total) by mouth every 4 (four) hours as needed for pain. 30 tablet 0 08/21/2014  . insulin glargine (LANTUS) 100 UNIT/ML injection Inject 50 Units into the skin at bedtime.    08/23/2014 at Unknown time  . insulin glulisine (APIDRA) 100 UNIT/ML injection Inject 10-20 Units into the skin 3 (three) times daily before meals. 10 breakfast 10 lunch and 20 supper   08/24/2014 at Unknown time  . lansoprazole (PREVACID) 30 MG capsule Take 30 mg by mouth 2 (two) times daily.    08/24/2014 at Unknown time  . Linaclotide (LINZESS) 145 MCG CAPS capsule Take 1 capsule (145 mcg total) by mouth daily. 30 capsule 0 08/24/2014 at Unknown time  . Multiple Vitamin (MULTIVITAMIN) tablet Take 1 tablet by mouth daily.   08/24/2014 at Unknown time  . nitroGLYCERIN (NITROSTAT) 0.4 MG SL tablet Place 1 tablet (0.4 mg total) under the tongue every 5 (five) minutes as needed (as needed for esophageal spasm). 30 tablet 1 couple months  . omega-3 acid ethyl esters (LOVAZA) 1 G capsule Take 2,000 mg by mouth 2 (two) times daily.    08/24/2014 at Unknown time  . ondansetron (ZOFRAN-ODT) 4 MG disintegrating tablet Take 4 mg by mouth 4 (four) times daily as needed for nausea.   Past Week at Unknown time  . oxyCODONE-acetaminophen (PERCOCET/ROXICET) 5-325 MG per tablet Take 1 tablet by mouth every 8 (eight) hours as needed for pain.   08/23/2014 at Unknown time  . pregabalin (LYRICA) 100 MG capsule Take 100 mg by mouth 3 (three) times daily.   08/24/2014 at Unknown time  . rOPINIRole (REQUIP) 2 MG tablet Take 2 mg by mouth at bedtime.    08/23/2014 at Unknown time  . Tamsulosin HCl (FLOMAX) 0.4 MG CAPS Take 0.4 mg by mouth daily.   08/24/2014 at Unknown time  . pregabalin (LYRICA) 75 MG capsule Take 75 mg by mouth 3 (three) times daily.    08/16/2014 at Unknown time   Assessment: 58 yo male with diabetic foot infection for empiric antibiotics  Vancomycin 1 g IV given in ED at  midnight  Goal of Therapy:  Vancomycin trough level 10-15 mcg/ml  Plan:  Vancomycin 1 g IV q8h Cefepime 1 g IV q8h  Tykeshia Tourangeau, Bronson Curb 08/25/2014,12:57 AM

## 2014-08-25 NOTE — H&P (Signed)
[facility] History and Physical  [name] [id number] DOB: 10-24-1955 DOA: 08/24/2014  Referring physician: ER physician. PCP: [name], MD   Chief Complaint: Worsening right foot wound.  HPI: [name] is a 58 y.o. male history of peripheral artery disease status post stent placement, diabetes mellitus type 2, hypertension, hyperlipidemia, OSA noncompliant with C Pap was referred to the ER by patient's orthopedic surgeon. Patient started noticing redness and small ulcer on the lateral aspect of his right foot almost 10 days ago. This slowly worsened and found a big ulcer with discharge. Patient had gone to his orthopedic surgeon's office today and was evaluated. As per the patient the probe was touching the bone and was told to come to the ER. In the ER which had x-rays that does not show any definite evidence of osteomyelitis but on exam patient is have also around 1 cm in size with surrounding erythema and mild discharge. Patient will be admitted for further management of right foot diabetic ulcer with infection and possible osteomyelitis. Patient otherwise denies any chest pain or shortness of breath.   Review of Systems: As presented in the history of presenting illness, rest negative.  Past Medical History  Diagnosis Date  . Carotid artery occlusion 11/10/10    LEFT CAROTID ENDARTERECTOMY  . Diabetes mellitus   . Hypertension   . Hyperlipidemia   . COPD (chronic obstructive pulmonary disease)   . DJD (degenerative joint disease)   . GERD (gastroesophageal reflux disease)   . Substernal chest pain   . 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 Slurred speech     AS PER WIFE IN D/C NOTE 11/10/10  . PAD (peripheral artery disease)     Distal aortogram June 2012. Atherectomy left popliteal artery July 2012.   . Barrett's esophagus   . Fatty liver   . Diverticulosis of colon (without mention of hemorrhage)   . Emphysema of lung   .  Osteomyelitis   . Complication of anesthesia     BP WENT UP AT [facility] "  . Sleep apnea     has not used CPAP in e years - took self off  . H/O hiatal hernia   . Neuromuscular disorder     peripheral neuropathy  . Full dentures   . Wears glasses    Past Surgical History  Procedure Laterality Date  . Laminectomy      X 3 LUMBAR AND X 2 CERVICAL SPINE OPERATIONS  . Carotid endarterectomy  11/10/10  . Hernia repair      LEFT INGUINAL AND UMBILICAL REPAIRS  . Anterior fusion cervical spine  02/06/06    C4-5, C5-6, C6-7; SURGEON DR. MAX COHEN  . Penile prosthesis implant  08/14/05    INFRAPUBIC INSERTION OF INFLATABLE PENILE PROSTHESIS; SURGEON DR. Amalia Hailey  . Laparoscopic cholecystectomy w/ cholangiography  11/09/04    SURGEON DR. Luella Cook  . Total knee arthroplasty  07/2002    RIGHT KNEE ; SURGEON  DR. [name] ALSO HAD ARTHROSCOPIC RIGHT KNEE IN  10/2001  . Shoulder arthroscopy    . Cholecystectomy    . Amputation  11/05/2011    Procedure: AMPUTATION RAY;  Surgeon: Wylene Simmer, MD;  Location: [place];  Service: Orthopedics;  Laterality: Right;  Amputation of Right 4&5th Toes  . Femoral artery stent      x6  . Amputation Left 11/26/2012    Procedure: AMPUTATION RAY;  Surgeon: Wylene Simmer, MD;  Location: [place];  Service: Orthopedics;  Laterality: Left;  fourth ray amputation  . Cardiac catheterization  10/31/04    2009  . Carpal tunnel release Right 10/21/2013    Procedure: RIGHT CARPAL TUNNEL RELEASE;  Surgeon: Wynonia Sours, MD;  Location: [place];  Service: Orthopedics;  Laterality: Right;  . Ulnar nerve transposition Right 10/21/2013    Procedure: RIGHT ELBOW  ULNAR NERVE DECOMPRESSION;  Surgeon: Wynonia Sours, MD;  Location: [place];  Service: Orthopedics;  Laterality: Right;  . Joint replacement Right 2001    Total  . Spine surgery    . Neck surgery    . Esophageal manometry Bilateral 07/19/2014    Procedure: ESOPHAGEAL MANOMETRY (EM);  Surgeon: Jerene Bears, MD;  Location: [facility];  Service: Gastroenterology;  Laterality: Bilateral;  . Lower extremity angiogram N/A 03/19/2012    Procedure: LOWER EXTREMITY ANGIOGRAM;  Surgeon: Burnell Blanks, MD;  Location: [facility] CATH LAB;  Service: Cardiovascular;  Laterality: N/A;   Social History:  reports that he quit smoking about 2 years ago. His smoking use included Cigarettes. He has a 17.5 pack-year smoking history. He uses smokeless tobacco. He reports that he does not drink alcohol or use illicit drugs. Where does patient live home. Can patient participate in ADLs? Yes.  Allergies  Allergen Reactions  . Ivp Dye [Iodinated Diagnostic Agents] Anaphylaxis    Breathing problems  . Adhesive [Tape] Rash    Family History:  Family History  Problem Relation Age of Onset  . Heart disease Father     Before age 63-  CAD, BPG  . Diabetes Father     Amputation  . Cancer Father     PROSTATE  . Hyperlipidemia Father   . Hypertension Father   . Heart attack Father     Triple BPG  . Arthritis      GRANDMOTHER  . Hypertension      OTHER FAMILY MEMBERS  . Colon cancer Brother   . Cancer Brother     Colon  . Diabetes Brother   . Heart disease Brother 45    A-Fib. Before age 17  . Hyperlipidemia Brother   . Hypertension Brother   . Cancer Sister     Breast      Prior to Admission medications   Medication Sig Start Date End Date Taking? Authorizing Provider  ALPRAZolam Duanne Moron) 0.5 MG tablet Take 0.25 mg by mouth 3 (three) times daily.    Yes Historical Provider, MD  aspirin EC 81 MG tablet Take 81 mg by mouth daily.   Yes Historical Provider, MD  atorvastatin (LIPITOR) 40 MG tablet Take 40 mg by mouth daily.   Yes Historical Provider, MD  AZOR 5-20 MG per tablet Take 1 tablet by mouth daily.  07/29/14  Yes Historical Provider, MD  B Complex-C (B-COMPLEX WITH VITAMIN C) tablet Take 1 tablet by mouth daily.   Yes Historical Provider, MD  carvedilol (COREG) 25 MG tablet Take 25 mg by  mouth every morning.    Yes Historical Provider, MD  carvedilol (COREG) 6.25 MG tablet Take 6.25 mg by mouth every evening.    Yes Historical Provider, MD  clopidogrel (PLAVIX) 75 MG tablet Take 75 mg by mouth daily. 1 tab daily 03/28/12  Yes Historical Provider, MD  ezetimibe (ZETIA) 10 MG tablet Take 10 mg by mouth every morning.   Yes Historical Provider, MD  fentaNYL (DURAGESIC - DOSED MCG/HR) 50 MCG/HR Place 1 patch onto  the skin every 3 (three) days.   Yes Historical Provider, MD  furosemide (LASIX) 80 MG tablet Take 80 mg by mouth daily.   Yes Historical Provider, MD  glyBURIDE-metformin (GLUCOVANCE) 2.5-500 MG per tablet Take 2 tablets by mouth 2 (two) times daily.  03/05/12  Yes Historical Provider, MD  HYDROmorphone (DILAUDID) 2 MG tablet Take 1 tablet (2 mg total) by mouth every 4 (four) hours as needed for pain. 11/27/12  Yes Ripudeep Krystal Eaton, MD  insulin glargine (LANTUS) 100 UNIT/ML injection Inject 50 Units into the skin at bedtime.    Yes Historical Provider, MD  insulin glulisine (APIDRA) 100 UNIT/ML injection Inject 10-20 Units into the skin 3 (three) times daily before meals. 10 breakfast 10 lunch and 20 supper   Yes Historical Provider, MD  lansoprazole (PREVACID) 30 MG capsule Take 30 mg by mouth 2 (two) times daily.   Yes Historical Provider, MD  Linaclotide Rolan Lipa) 145 MCG CAPS capsule Take 1 capsule (145 mcg total) by mouth daily. 08/09/14  Yes Jerene Bears, MD  Multiple Vitamin (MULTIVITAMIN) tablet Take 1 tablet by mouth daily.   Yes Historical Provider, MD  nitroGLYCERIN (NITROSTAT) 0.4 MG SL tablet Place 1 tablet (0.4 mg total) under the tongue every 5 (five) minutes as needed (as needed for esophageal spasm). 08/09/14  Yes Jerene Bears, MD  omega-3 acid ethyl esters (LOVAZA) 1 G capsule Take 2,000 mg by mouth 2 (two) times daily.    Yes Historical Provider, MD  ondansetron (ZOFRAN-ODT) 4 MG disintegrating tablet Take 4 mg by mouth 4 (four) times daily as needed for nausea.    Yes Historical Provider, MD  oxyCODONE-acetaminophen (PERCOCET/ROXICET) 5-325 MG per tablet Take 1 tablet by mouth every 8 (eight) hours as needed for pain.   Yes Historical Provider, MD  pregabalin (LYRICA) 100 MG capsule Take 100 mg by mouth 3 (three) times daily.   Yes Historical Provider, MD  rOPINIRole (REQUIP) 2 MG tablet Take 2 mg by mouth at bedtime.    Yes Historical Provider, MD  Tamsulosin HCl (FLOMAX) 0.4 MG CAPS Take 0.4 mg by mouth daily.   Yes Historical Provider, MD  pregabalin (LYRICA) 75 MG capsule Take 75 mg by mouth 3 (three) times daily.     Historical Provider, MD    Physical Exam: Filed Vitals:   08/24/14 1955 08/24/14 2330 08/24/14 2357  BP: 189/66 171/70 145/59  Pulse: 78 67 69  Temp: 98.2 F (36.8 C)    TempSrc: Oral    Resp: 20  18  Height: 6\' 2"  (1.88 m)    Weight: 113.399 kg (250 lb)    SpO2: 100% 100% 100%     General:  Well-developed and nourished.  Eyes: Anicteric no pallor.  ENT: No discharge from the ears eyes nose or mouth.  Neck: No mass felt.  Cardiovascular: S1-S2 heard.  Respiratory: No rhonchi or crepitations.  Abdomen: Soft nontender bowel sounds present.  Skin: Ulcer on the lateral aspect of the right foot with mild erythema surrounding with baseline mild discharge.  Musculoskeletal: Bilateral lateral 2 toes have been amputated.  Psychiatric: Appears normal.  Neurologic: Alert awake oriented to time place and person. Moves all extremities.  Labs on Admission:  Basic Metabolic Panel:  Recent Labs Lab 08/24/14 2013  NA 135*  K 3.9  CL 95*  CO2 25  GLUCOSE 224*  BUN 12  CREATININE 1.07  CALCIUM 8.6   Liver Function Tests:  Recent Labs Lab 08/24/14 2013  AST 15  ALT 11  ALKPHOS 125*  BILITOT 0.6  PROT 7.1  ALBUMIN 3.8   No results for input(s): LIPASE, AMYLASE in the last 168 hours. No results for input(s): AMMONIA in the last 168 hours. CBC:  Recent Labs Lab 08/24/14 2013  WBC 8.5  NEUTROABS 6.4   HGB 11.5*  HCT 31.6*  MCV 84.0  PLT 96*   Cardiac Enzymes: No results for input(s): CKTOTAL, CKMB, CKMBINDEX, TROPONINI in the last 168 hours.  BNP (last 3 results) No results for input(s): PROBNP in the last 8760 hours. CBG:  Recent Labs Lab 08/25/14 0025  GLUCAP 194*    Radiological Exams on Admission: Dg Foot Complete Right  08/24/2014   CLINICAL DATA:  Wound abscess, lateral right foot for 9 days  EXAM: RIGHT FOOT COMPLETE - 3+ VIEW  COMPARISON:  August 17, 2014  FINDINGS: Frontal, oblique, and lateral views were obtained. The patient has had amputations at the levels of the mid fourth and fifth metatarsals, stable. Soft tissue air distal to the fourth metatarsal appears essentially stable and may represent soft tissue infection. No well-defined abscess is not seen radiographically. There is no acute fracture or dislocation. There is advanced osteoarthritic change in the first MTP joint. There is moderate narrowing of all remaining PIP and DIP joints, stable. There is osteoarthritic change in the dorsal midfoot with spurring and narrowing, stable. There are small posterior and inferior calcaneal spurs. No demonstrable osteomyelitis.  IMPRESSION: Areas of extensive osteoarthritic change, stable. Amputation at the mid fourth and fifth metatarsal levels. No bony destruction seen. Soft tissue air distal to the fourth metatarsal may represent soft tissue infection; well-defined abscess not seen.  If there remains concern for osteomyelitis clinically, MR or nuclear medicine three-phase bone scan potentially could be helpful to further assess.   Electronically Signed   By: Lowella Grip M.D.   On: 08/24/2014 21:52     Assessment/Plan Principal Problem:   Cellulitis of foot, right Active Problems:   PAD (peripheral artery disease)   Diabetes mellitus type 2, controlled   Essential hypertension   Hyperlipidemia   Cellulitis of foot without toes, right   1. Diabetic foot ulcer  with cellulitis of the right foot with possible osteomyelitis - at this time patient has been placed on vancomycin and cefepime. Check sedimentation rate check MRI of the foot. Patient is well known to Dr. Psychologist, prison and probation services. 2. Diabetes mellitus type 2 uncontrolled - patient is on Lantus. Hold metformin while inpatient and continue glyburide patient has been placed on sliding-scale coverage. 3. Hypertension - continue home medications. 4. Hyperlipidemia - on statins. 5. Peripheral arterial disease on antiplatelet agents which may need to be held if surgery anticipated. 6. History of OSA noncompliant with C Pap.    Code Status: Full code.  Family Communication: None.  Disposition Plan: Admit to inpatient.    Zillah Alexie N. [facility] Pager [phone].  If 7PM-7AM, please contact night-coverage [email or web address] Password [email or web address] 08/25/2014, 12:40 AM

## 2014-08-25 NOTE — Progress Notes (Signed)
Patient arrived to 4N04 AAOx4 with an 8/10 pain in the right foot. Physician paged of patient's arrival. Vital signs taken and call bell by his side. Pt was able to walk from the stretcher to the bed. Pt has not had his nighttime medications at this point and BP is high. Will continue to monitor. Wendy Hoback, Rande Brunt

## 2014-08-25 NOTE — Progress Notes (Signed)
Utilization Review Completed.Donne Anon T12/16/2015

## 2014-08-25 NOTE — Consult Note (Signed)
WOC reviewed chart, per podiatry notes patient with high probability of osteomyelitis and gangrenous changes of the toe/foot that wound indicate the need for orthopedic surgery evaluation and would be considered outside of the scope of practice for the [facility] nurse.  Will not consult at this time for that reason.    Would recommend orthopedic consultation. Dry dressing until evaluation.  Discussed POC with patient and bedside nurse.  Re consult if needed, will not follow at this time. Thanks  Trai Ells Kellogg, [place] [phone])

## 2014-08-25 NOTE — Consult Note (Signed)
RI have reviewed the patient's current medications.eason for Consult: Osteomyelitis right foot fifth metatarsal status post fifth and fourth ray amputations Referring Physician: Dr. Tyson Alias is an 58 y.o. male.  HPI: Patient is a 58 year old gentleman with diabetes peripheral vascular disease status post stents placed in [place]. Patient is developed acute ulcer over the fifth metatarsal right foot. Patient was admitted for IV antibiotics.  Past Medical History  Diagnosis Date  . Carotid artery occlusion 11/10/10    LEFT CAROTID ENDARTERECTOMY  . Diabetes mellitus   . Hypertension   . Hyperlipidemia   . COPD (chronic obstructive pulmonary disease)   . DJD (degenerative joint disease)   . GERD (gastroesophageal reflux disease)   . Substernal chest pain   . 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 Slurred speech     AS PER WIFE IN D/C NOTE 11/10/10  . PAD (peripheral artery disease)     Distal aortogram June 2012. Atherectomy left popliteal artery July 2012.   . Barrett's esophagus   . Fatty liver   . Diverticulosis of colon (without mention of hemorrhage)   . Emphysema of lung   . Osteomyelitis   . Complication of anesthesia     BP WENT UP AT [facility] "  . Sleep apnea     has not used CPAP in e years - took self off  . H/O hiatal hernia   . Neuromuscular disorder     peripheral neuropathy  . Full dentures   . Wears glasses     Past Surgical History  Procedure Laterality Date  . Laminectomy      X 3 LUMBAR AND X 2 CERVICAL SPINE OPERATIONS  . Carotid endarterectomy  11/10/10  . Hernia repair      LEFT INGUINAL AND UMBILICAL REPAIRS  . Anterior fusion cervical spine  02/06/06    C4-5, C5-6, C6-7; SURGEON DR. MAX COHEN  . Penile prosthesis implant  08/14/05    INFRAPUBIC INSERTION OF INFLATABLE PENILE PROSTHESIS; SURGEON DR. Amalia Hailey  . Laparoscopic cholecystectomy w/ cholangiography  11/09/04    SURGEON DR. Luella Cook  . Total knee arthroplasty   07/2002    RIGHT KNEE ; SURGEON  DR. [name] ALSO HAD ARTHROSCOPIC RIGHT KNEE IN  10/2001  . Shoulder arthroscopy    . Cholecystectomy    . Amputation  11/05/2011    Procedure: AMPUTATION RAY;  Surgeon: Wylene Simmer, MD;  Location: [facility];  Service: Orthopedics;  Laterality: Right;  Amputation of Right 4&5th Toes  . Femoral artery stent      x6  . Amputation Left 11/26/2012    Procedure: AMPUTATION RAY;  Surgeon: Wylene Simmer, MD;  Location: [facility];  Service: Orthopedics;  Laterality: Left;  fourth ray amputation  . Cardiac catheterization  10/31/04    2009  . Carpal tunnel release Right 10/21/2013    Procedure: RIGHT CARPAL TUNNEL RELEASE;  Surgeon: Wynonia Sours, MD;  Location: [facility];  Service: Orthopedics;  Laterality: Right;  . Ulnar nerve transposition Right 10/21/2013    Procedure: RIGHT ELBOW  ULNAR NERVE DECOMPRESSION;  Surgeon: Wynonia Sours, MD;  Location: [facility];  Service: Orthopedics;  Laterality: Right;  . Joint replacement Right 2001    Total  . Spine surgery    . Neck surgery    . Esophageal manometry Bilateral 07/19/2014    Procedure: ESOPHAGEAL MANOMETRY (EM);  Surgeon: Jerene Bears, MD;  Location: [facility];  Service: Gastroenterology;  Laterality: Bilateral;  . Lower extremity angiogram N/A 03/19/2012    Procedure: LOWER EXTREMITY ANGIOGRAM;  Surgeon: Burnell Blanks, MD;  Location: [facility] CATH LAB;  Service: Cardiovascular;  Laterality: N/A;    Family History  Problem Relation Age of Onset  . Heart disease Father     Before age 77-  CAD, BPG  . Diabetes Father     Amputation  . Cancer Father     PROSTATE  . Hyperlipidemia Father   . Hypertension Father   . Heart attack Father     Triple BPG  . Arthritis      GRANDMOTHER  . Hypertension      OTHER FAMILY MEMBERS  . Colon cancer Brother   . Cancer Brother     Colon  . Diabetes Brother   . Heart disease Brother 40    A-Fib. Before age 71  . Hyperlipidemia Brother   .  Hypertension Brother   . Cancer Sister     Breast    Social History:  reports that he quit smoking about 2 years ago. His smoking use included Cigarettes. He has a 17.5 pack-year smoking history. He uses smokeless tobacco. He reports that he does not drink alcohol or use illicit drugs.  Allergies:  Allergies  Allergen Reactions  . Ivp Dye [Iodinated Diagnostic Agents] Anaphylaxis    Breathing problems  . Adhesive [Tape] Rash    Medications: I have reviewed the patient's current medications.  Results for orders placed or performed during the hospital encounter of 08/24/14 (from the past 48 hour(s))  CBC with Differential     Status: Abnormal   Collection Time: 08/24/14  8:13 PM  Result Value Ref Range   WBC 8.5 4.0 - 10.5 K/uL   RBC 3.76 (L) 4.22 - 5.81 MIL/uL   Hemoglobin 11.5 (L) 13.0 - 17.0 g/dL   HCT 31.6 (L) 39.0 - 52.0 %   MCV 84.0 78.0 - 100.0 fL   MCH 30.6 26.0 - 34.0 pg   MCHC 36.4 (H) 30.0 - 36.0 g/dL   RDW 12.8 11.5 - 15.5 %   Platelets 96 (L) 150 - 400 K/uL    Comment: PLATELET COUNT CONFIRMED BY SMEAR REPEATED TO VERIFY SPECIMEN CHECKED FOR CLOTS    Neutrophils Relative % 75 43 - 77 %   Neutro Abs 6.4 1.7 - 7.7 K/uL   Lymphocytes Relative 16 12 - 46 %   Lymphs Abs 1.3 0.7 - 4.0 K/uL   Monocytes Relative 8 3 - 12 %   Monocytes Absolute 0.7 0.1 - 1.0 K/uL   Eosinophils Relative 1 0 - 5 %   Eosinophils Absolute 0.1 0.0 - 0.7 K/uL   Basophils Relative 0 0 - 1 %   Basophils Absolute 0.0 0.0 - 0.1 K/uL  Comprehensive metabolic panel     Status: Abnormal   Collection Time: 08/24/14  8:13 PM  Result Value Ref Range   Sodium 135 (L) 137 - 147 mEq/L   Potassium 3.9 3.7 - 5.3 mEq/L   Chloride 95 (L) 96 - 112 mEq/L   CO2 25 19 - 32 mEq/L   Glucose, Bld 224 (H) 70 - 99 mg/dL   BUN 12 6 - 23 mg/dL   Creatinine, Ser 1.07 0.50 - 1.35 mg/dL   Calcium 8.6 8.4 - 10.5 mg/dL   Total Protein 7.1 6.0 - 8.3 g/dL   Albumin 3.8 3.5 - 5.2 g/dL   AST 15 0 - 37 U/L  ALT 11 0  - 53 U/L   Alkaline Phosphatase 125 (H) 39 - 117 U/L   Total Bilirubin 0.6 0.3 - 1.2 mg/dL   GFR calc non Af Amer 74 (L) >90 mL/min   GFR calc Af Amer 85 (L) >90 mL/min    Comment: (NOTE) The eGFR has been calculated using the CKD EPI equation. This calculation has not been validated in all clinical situations. eGFR's persistently <90 mL/min signify possible Chronic Kidney Disease.    Anion gap 15 5 - 15  I-Stat CG4 Lactic Acid, ED     Status: None   Collection Time: 08/24/14  8:29 PM  Result Value Ref Range   Lactic Acid, Venous 1.70 0.5 - 2.2 mmol/L  CBG monitoring, ED     Status: Abnormal   Collection Time: 08/25/14 12:25 AM  Result Value Ref Range   Glucose-Capillary 194 (H) 70 - 99 mg/dL  Glucose, capillary     Status: Abnormal   Collection Time: 08/25/14  6:17 AM  Result Value Ref Range   Glucose-Capillary 149 (H) 70 - 99 mg/dL  Sedimentation rate     Status: Abnormal   Collection Time: 08/25/14  7:39 AM  Result Value Ref Range   Sed Rate 38 (H) 0 - 16 mm/hr  Comprehensive metabolic panel     Status: Abnormal   Collection Time: 08/25/14  7:39 AM  Result Value Ref Range   Sodium 138 137 - 147 mEq/L   Potassium 3.2 (L) 3.7 - 5.3 mEq/L   Chloride 98 96 - 112 mEq/L   CO2 28 19 - 32 mEq/L   Glucose, Bld 150 (H) 70 - 99 mg/dL   BUN 14 6 - 23 mg/dL   Creatinine, Ser 1.18 0.50 - 1.35 mg/dL   Calcium 8.6 8.4 - 10.5 mg/dL   Total Protein 6.6 6.0 - 8.3 g/dL   Albumin 3.4 (L) 3.5 - 5.2 g/dL   AST 10 0 - 37 U/L   ALT 10 0 - 53 U/L   Alkaline Phosphatase 114 39 - 117 U/L   Total Bilirubin 0.5 0.3 - 1.2 mg/dL   GFR calc non Af Amer 65 (L) >90 mL/min   GFR calc Af Amer 76 (L) >90 mL/min    Comment: (NOTE) The eGFR has been calculated using the CKD EPI equation. This calculation has not been validated in all clinical situations. eGFR's persistently <90 mL/min signify possible Chronic Kidney Disease.    Anion gap 12 5 - 15  CBC with Differential     Status: Abnormal    Collection Time: 08/25/14  7:39 AM  Result Value Ref Range   WBC 7.8 4.0 - 10.5 K/uL   RBC 3.66 (L) 4.22 - 5.81 MIL/uL   Hemoglobin 10.8 (L) 13.0 - 17.0 g/dL   HCT 30.3 (L) 39.0 - 52.0 %   MCV 82.8 78.0 - 100.0 fL   MCH 29.5 26.0 - 34.0 pg   MCHC 35.6 30.0 - 36.0 g/dL   RDW 12.8 11.5 - 15.5 %   Platelets 184 150 - 400 K/uL    Comment: REPEATED TO VERIFY   Neutrophils Relative % 70 43 - 77 %   Neutro Abs 5.5 1.7 - 7.7 K/uL   Lymphocytes Relative 18 12 - 46 %   Lymphs Abs 1.4 0.7 - 4.0 K/uL   Monocytes Relative 11 3 - 12 %   Monocytes Absolute 0.8 0.1 - 1.0 K/uL   Eosinophils Relative 2 0 - 5 %   Eosinophils Absolute  0.1 0.0 - 0.7 K/uL   Basophils Relative 0 0 - 1 %   Basophils Absolute 0.0 0.0 - 0.1 K/uL  Glucose, capillary     Status: Abnormal   Collection Time: 08/25/14 11:36 AM  Result Value Ref Range   Glucose-Capillary 182 (H) 70 - 99 mg/dL  Glucose, capillary     Status: Abnormal   Collection Time: 08/25/14  4:35 PM  Result Value Ref Range   Glucose-Capillary 215 (H) 70 - 99 mg/dL    Mr Foot Right W Wo Contrast  08/25/2014   CLINICAL DATA:  1 cm soft tissue ulceration and cellulitis at the lateral aspect of the right foot. 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  EXAM: MRI OF THE RIGHT FOREFOOT WITHOUT AND WITH CONTRAST  TECHNIQUE: Multiplanar, multisequence MR imaging was performed both before and after administration of intravenous contrast.  CONTRAST:  84m MULTIHANCE GADOBENATE DIMEGLUMINE 529 MG/ML IV SOLN  COMPARISON:  MRI dated 11/01/2011 and radiographs dated 08/24/2014  FINDINGS: There is soft tissue ulceration just lateral to the stump of the fifth metatarsal with an area of devitalized soft tissue measuring approximately 27 mm in diameter. There is enhancing soft tissue around the devitalized area consistent with cellulitis. This extends onto the dorsum of the foot distal to the stump of the fifth metatarsal. There is  no bulb new bone destruction or abnormal edema or enhancement in the stump of the fifth metatarsal.  There are no joint effusions. 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  IMPRESSION: 1. Soft tissue ulceration of the lateral aspect of the foot with no deep ulceration or underlying osteomyelitis. There is adjacent cellulitis. 2. The area of devitalized soft tissue are at the site of the ulcer is approximately 2.7 cm in diameter and approximately 9 mm deep.   Electronically Signed   By: JRozetta NunneryM.D.   On: 08/25/2014 09:35   Dg Foot Complete Right  08/24/2014   CLINICAL DATA:  Wound abscess, lateral right foot for 9 days  EXAM: RIGHT FOOT COMPLETE - 3+ VIEW  COMPARISON:  August 17, 2014  FINDINGS: Frontal, oblique, and lateral views were obtained. The patient has had amputations at the levels of the mid fourth and fifth metatarsals, stable. Soft tissue air distal to the fourth metatarsal appears essentially stable and may represent soft tissue infection. No well-defined abscess is not seen radiographically. There is no acute fracture or dislocation. There is advanced osteoarthritic change in the first MTP joint. There is moderate narrowing of all remaining PIP and DIP joints, stable. There is osteoarthritic change in the dorsal midfoot with spurring and narrowing, stable. There are small posterior and inferior calcaneal spurs. No demonstrable osteomyelitis.  IMPRESSION: Areas of extensive osteoarthritic change, stable. Amputation at the mid fourth and fifth metatarsal levels. No bony destruction seen. Soft tissue air distal to the fourth metatarsal may represent soft tissue infection; well-defined abscess not seen.  If there remains concern for osteomyelitis clinically, MR or nuclear medicine three-phase bone scan potentially could be helpful to further assess.   Electronically Signed   By: WLowella Grip M.D.   On: 08/24/2014 21:52    Review of Systems  All other systems reviewed and are negative.  Blood pressure 153/64, pulse 64, temperature 99 F (37.2 C), temperature source Oral, resp. rate 18, height 6' 2"  (1.88 m), weight 113.399 kg (  250 lb), SpO2 99 %. Physical Exam On examination patient has a strong dorsalis pedis pulse on the right. He has an ulcer over the fifth metatarsal which does probe to bone. There is no a centering cellulitis no purulence. Review of the MRI scan does show ulcer which extends to the fifth metatarsal and is down to the fourth metatarsal as well. Assessment/Plan: Assessment: Osteomyelitis right foot fourth and fifth rays status post partial ray amputations with a good dorsalis pedis pulse with diabetic insensate neuropathy.  Plan: I feel that it is worth trying of further foot salvage intervention. We'll plan for a amputation through the fifth and fourth rays and placement of antibiotic beads. Plan for surgery on Friday afternoon with patient able to be discharged on Saturday. Risks and benefits of surgery were discussed with the patient he states he understands and wishes to proceed at this time. He is currently on Plavix to keep his stents open. I recommend that he continue the Plavix.  Zakyra Kukuk V 08/25/2014, 6:09 PM

## 2014-08-26 ENCOUNTER — Other Ambulatory Visit (HOSPITAL_COMMUNITY): Payer: Self-pay | Admitting: Orthopedic Surgery

## 2014-08-26 DIAGNOSIS — I739 Peripheral vascular disease, unspecified: Secondary | ICD-10-CM

## 2014-08-26 LAB — BASIC METABOLIC PANEL WITH GFR
Anion gap: 11 (ref 5–15)
BUN: 15 mg/dL (ref 6–23)
CO2: 27 meq/L (ref 19–32)
Calcium: 8.9 mg/dL (ref 8.4–10.5)
Chloride: 103 meq/L (ref 96–112)
Creatinine, Ser: 1.09 mg/dL (ref 0.50–1.35)
GFR calc Af Amer: 83 mL/min — ABNORMAL LOW
GFR calc non Af Amer: 72 mL/min — ABNORMAL LOW
Glucose, Bld: 81 mg/dL (ref 70–99)
Potassium: 4.1 meq/L (ref 3.7–5.3)
Sodium: 141 meq/L (ref 137–147)

## 2014-08-26 LAB — HEMOGLOBIN A1C
Hgb A1c MFr Bld: 6.8 % — ABNORMAL HIGH (ref <5.7)
Mean Plasma Glucose: 148 mg/dL — ABNORMAL HIGH (ref <117)

## 2014-08-26 LAB — CBC
HCT: 29.8 % — ABNORMAL LOW (ref 39.0–52.0)
Hemoglobin: 10.3 g/dL — ABNORMAL LOW (ref 13.0–17.0)
MCH: 28.8 pg (ref 26.0–34.0)
MCHC: 34.6 g/dL (ref 30.0–36.0)
MCV: 83.2 fL (ref 78.0–100.0)
Platelets: 186 10*3/uL (ref 150–400)
RBC: 3.58 MIL/uL — ABNORMAL LOW (ref 4.22–5.81)
RDW: 12.9 % (ref 11.5–15.5)
WBC: 8.2 10*3/uL (ref 4.0–10.5)

## 2014-08-26 LAB — GLUCOSE, CAPILLARY
Glucose-Capillary: 122 mg/dL — ABNORMAL HIGH (ref 70–99)
Glucose-Capillary: 244 mg/dL — ABNORMAL HIGH (ref 70–99)
Glucose-Capillary: 146 mg/dL — ABNORMAL HIGH (ref 70–99)
Glucose-Capillary: 260 mg/dL — ABNORMAL HIGH (ref 70–99)

## 2014-08-26 LAB — VANCOMYCIN, TROUGH: Vancomycin Tr: 19.7 ug/mL (ref 10.0–20.0)

## 2014-08-26 MED ORDER — CHLORHEXIDINE GLUCONATE 4 % EX LIQD
60.0000 mL | Freq: Once | CUTANEOUS | Status: AC
Start: 2014-08-27 — End: 2014-08-27
  Administered 2014-08-27: 4 via TOPICAL
  Filled 2014-08-26: qty 60

## 2014-08-26 NOTE — Progress Notes (Signed)
ANTIBIOTIC CONSULT NOTE - FOLLOW UP  Pharmacy Consult for vancomycin and cefepime Indication: diabetic R foot wound  Allergies  Allergen Reactions  . Ivp Dye [Iodinated Diagnostic Agents] Anaphylaxis    Breathing problems  . Adhesive [Tape] Rash    Patient Measurements: Height: 6\' 2"  (188 cm) Weight: 250 lb (113.399 kg) IBW/kg (Calculated) : 82.2  Vital Signs: Temp: 98.2 F (36.8 C) (12/17 0600) Temp Source: Oral (12/17 0600) BP: 161/60 mmHg (12/17 1037) Pulse Rate: 51 (12/17 1037) Intake/Output from previous day: 12/16 0701 - 12/17 0700 In: 360 [P.O.:360] Out: -  Intake/Output from this shift:    Labs:  Recent Labs  08/24/14 2013 08/25/14 0739 08/26/14 0430  WBC 8.5 7.8 8.2  HGB 11.5* 10.8* 10.3*  PLT 96* 184 186  CREATININE 1.07 1.18 1.09   Estimated Creatinine Clearance: 96.5 mL/min (by C-G formula based on Cr of 1.09).  Recent Labs  08/26/14 1236  [place] 19.7     Microbiology: No results found for this or any previous visit (from the past 720 hour(s)).  Anti-infectives    Start     Dose/Rate Route Frequency Ordered Stop   08/25/14 0600  vancomycin (VANCOCIN) IVPB 1000 mg/200 mL premix     1,000 mg200 mL/hr over 60 Minutes Intravenous Every 8 hours 08/25/14 0101     08/25/14 0200  ceFEPIme (MAXIPIME) 1 g in dextrose 5 % 50 mL IVPB     1 g100 mL/hr over 30 Minutes Intravenous Every 8 hours 08/25/14 0101     08/24/14 2330  piperacillin-tazobactam (ZOSYN) IVPB 3.375 g     3.375 g12.5 mL/hr over 240 Minutes Intravenous  Once 08/24/14 2327 08/25/14 0004   08/24/14 2330  vancomycin (VANCOCIN) IVPB 1000 mg/200 mL premix     1,000 mg200 mL/hr over 60 Minutes Intravenous  Once 08/24/14 2327 08/25/14 0106      Assessment: 58 y/o male who presented to the ED on 12/15 with worsening R foot wound. Pharmacy consulted to begin vancomycin for cellulitis and possible osteomyelitis. MRI neg for osteo. Ortho consulted and plans amputation of R 4/5th rays  tomorrow for osteo. Vancomycin trough is therapeutic at 19.7 (drawn an hr early) so likely true trough lower. Renal function stable, no UOP recorded. He is afebrile and WBC are normal.  Goal of Therapy:  Vancomycin trough level 15-20 mcg/ml  Plan:  - Continue vancomycin 1000 mg IV q8h - Continue cefepime 1 g IV q8h - Consider d/c antibiotics after post-op doses given tomorrow if clean margins - Monitor renal function and clinical course  [facility], Pharm.D., BCPS Clinical Pharmacist Pager: [phone] 08/26/2014 1:53 PM

## 2014-08-26 NOTE — Progress Notes (Signed)
[facility] PROGRESS NOTE  [name] [id number] DOB: Mar 10, 1956 DOA: 08/24/2014  PCP: [name], MD  Brief HPI: 58 year old Caucasian male with the history of diabetes, peripheral artery disease, hypertension, who presented with redness and ulceration in the lateral aspect of his right foot starting about 10 days prior to admission. He was referred to the emergency department by his podiatrist. There was a concern that he might have osteomyelitis along with cellulitis.  Past medical history:  Past Medical History  Diagnosis Date  . Carotid artery occlusion 11/10/10    LEFT CAROTID ENDARTERECTOMY  . Diabetes mellitus   . Hypertension   . Hyperlipidemia   . COPD (chronic obstructive pulmonary disease)   . DJD (degenerative joint disease)   . GERD (gastroesophageal reflux disease)   . Substernal chest pain   . 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 Slurred speech     AS PER WIFE IN D/C NOTE 11/10/10  . PAD (peripheral artery disease)     Distal aortogram June 2012. Atherectomy left popliteal artery July 2012.   . Barrett's esophagus   . Fatty liver   . Diverticulosis of colon (without mention of hemorrhage)   . Emphysema of lung   . Osteomyelitis   . Complication of anesthesia     BP WENT UP AT [facility] "  . Sleep apnea     has not used CPAP in e years - took self off  . H/O hiatal hernia   . Neuromuscular disorder     peripheral neuropathy  . Full dentures   . Wears glasses     Consultants: Orthopedics  Procedures: None  Antibiotics: Vanc and Cefepime 12/15-->  Subjective: Patient overall feels about the same. Pain may be a little bit better controlled. Anxious about his surgery.   Objective: Vital Signs  Filed Vitals:   08/25/14 1006 08/25/14 1426 08/25/14 2233 08/26/14 0600  BP: 145/65 153/64 152/65 130/60  Pulse: 55 64 54 51  Temp: 99 F (37.2 C) 99 F (37.2 C) 98.1 F (36.7 C) 98.2 F (36.8 C)  TempSrc: Oral  Oral Oral Oral  Resp: 18 18 20 20   Height:      Weight:      SpO2: 99% 99% 99% 97%    Intake/Output Summary (Last 24 hours) at 08/26/14 0809 Last data filed at 08/25/14 1300  Gross per 24 hour  Intake    360 ml  Output      0 ml  Net    360 ml   Filed Weights   08/24/14 1955  Weight: 113.399 kg (250 lb)    General appearance: alert, cooperative, appears stated age and no distress Resp: clear to auscultation bilaterally Cardio: regular rate and rhythm, S1, S2 normal, no murmur, click, rub or gallop GI: soft, non-tender; bowel sounds normal; no masses,  no organomegaly Extremities: Dressing was not removed today. 12/16 findings: There is a ulcer measuring about 3-4 cm in diameter on the lateral aspect of the right foot. No active drainage is noted. Some yellowish exudate is seen. There is slight erythema surrounding it. Tender to palpation.  Lab Results:  Basic Metabolic Panel:  Recent Labs Lab 08/24/14 2013 08/25/14 0739 08/26/14 0430  NA 135* 138 141  K 3.9 3.2* 4.1  CL 95* 98 103  CO2 25 28 27   GLUCOSE 224* 150* 81  BUN 12 14 15   CREATININE 1.07 1.18 1.09  CALCIUM 8.6 8.6 8.9  Liver Function Tests:  Recent Labs Lab 08/24/14 2013 08/25/14 0739  AST 15 10  ALT 11 10  ALKPHOS 125* 114  BILITOT 0.6 0.5  PROT 7.1 6.6  ALBUMIN 3.8 3.4*   CBC:  Recent Labs Lab 08/24/14 2013 08/25/14 0739 08/26/14 0430  WBC 8.5 7.8 8.2  NEUTROABS 6.4 5.5  --   HGB 11.5* 10.8* 10.3*  HCT 31.6* 30.3* 29.8*  MCV 84.0 82.8 83.2  PLT 96* 184 186   CBG:  Recent Labs Lab 08/25/14 0617 08/25/14 1136 08/25/14 1635 08/25/14 2231 08/26/14 0709  GLUCAP 149* 182* 215* 184* 122*     Studies/Results: Mr Foot Right W Wo Contrast  08/25/2014   CLINICAL DATA:  1 cm soft tissue ulceration and cellulitis at the lateral aspect of the right foot. 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  EXAM: MRI OF THE RIGHT FOREFOOT WITHOUT  AND WITH CONTRAST  TECHNIQUE: Multiplanar, multisequence MR imaging was performed both before and after administration of intravenous contrast.  CONTRAST:  33mL MULTIHANCE GADOBENATE DIMEGLUMINE 529 MG/ML IV SOLN  COMPARISON:  MRI dated 11/01/2011 and radiographs dated 08/24/2014  FINDINGS: There is soft tissue ulceration just lateral to the stump of the fifth metatarsal with an area of devitalized soft tissue measuring approximately 27 mm in diameter. There is enhancing soft tissue around the devitalized area consistent with cellulitis. This extends onto the dorsum of the foot distal to the stump of the fifth metatarsal. There is no bulb new bone destruction or abnormal edema or enhancement in the stump of the fifth metatarsal.  There are no joint effusions. 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  IMPRESSION: 1. Soft tissue ulceration of the lateral aspect of the foot with no deep ulceration or underlying osteomyelitis. There is adjacent cellulitis. 2. The area of devitalized soft tissue are at the site of the ulcer is approximately 2.7 cm in diameter and approximately 9 mm deep.   Electronically Signed   By: Rozetta Nunnery M.D.   On: 08/25/2014 09:35   Dg Foot Complete Right  08/24/2014   CLINICAL DATA:  Wound abscess, lateral right foot for 9 days  EXAM: RIGHT FOOT COMPLETE - 3+ VIEW  COMPARISON:  August 17, 2014  FINDINGS: Frontal, oblique, and lateral views were obtained. The patient has had amputations at the levels of the mid fourth and fifth metatarsals, stable. Soft tissue air distal to the fourth metatarsal appears essentially stable and may represent soft tissue infection. No well-defined abscess is not seen radiographically. There is no acute fracture or dislocation. There is advanced osteoarthritic change in the first MTP joint. There is moderate narrowing of all remaining PIP and DIP joints, stable.  There is osteoarthritic change in the dorsal midfoot with spurring and narrowing, stable. There are small posterior and inferior calcaneal spurs. No demonstrable osteomyelitis.  IMPRESSION: Areas of extensive osteoarthritic change, stable. Amputation at the mid fourth and fifth metatarsal levels. No bony destruction seen. Soft tissue air distal to the fourth metatarsal may represent soft tissue infection; well-defined abscess not seen.  If there remains concern for osteomyelitis clinically, MR or nuclear medicine three-phase bone scan potentially could be helpful to further assess.   Electronically Signed   By: Lowella Grip M.D.   On: 08/24/2014 21:52    Medications:  Scheduled: . ALPRAZolam  0.25 mg Oral TID  . amLODipine  5 mg Oral QHS  . aspirin EC  81 mg Oral Daily  . atorvastatin  40 mg Oral Daily  . carvedilol  25 mg Oral q morning - 10a  . carvedilol  6.25 mg Oral QPM  . ceFEPime (MAXIPIME) IV  1 g Intravenous Q8H  . clopidogrel  75 mg Oral Daily  . enoxaparin (LOVENOX) injection  40 mg Subcutaneous Q24H  . ezetimibe  10 mg Oral q morning - 10a  . fentaNYL  50 mcg Transdermal Q72H  . furosemide  80 mg Oral Daily  . glyBURIDE  5 mg Oral BID WC  . insulin aspart  0-9 Units Subcutaneous TID WC  . insulin glargine  50 Units Subcutaneous QHS  . irbesartan  150 mg Oral QHS  . Linaclotide  145 mcg Oral Daily  . omega-3 acid ethyl esters  2,000 mg Oral BID  . pantoprazole  40 mg Oral Daily  . pregabalin  100 mg Oral TID  . rOPINIRole  2 mg Oral QHS  . sodium chloride  3 mL Intravenous Q12H  . tamsulosin  0.4 mg Oral Daily  . vancomycin  1,000 mg Intravenous Q8H   Continuous:  [id number] **OR** acetaminophen, HYDROmorphone (DILAUDID) injection, nitroGLYCERIN, ondansetron **OR** ondansetron (ZOFRAN) IV  Assessment/Plan:  Principal Problem:   Cellulitis of foot, right Active Problems:   PAD (peripheral artery disease)   Diabetes mellitus type 2, controlled   Essential  hypertension   Hyperlipidemia   Cellulitis of foot without toes, right    Diabetic foot ulcer with cellulitis of the right foot MRI did not show osteomyelitis. Sedimentation ratio was only minimally elevated. Orthopedics was consulted. They're recommending partial amputation of that area. This is planned for Friday afternoon. Continue with intravenous antibiotics.   Diabetes mellitus type 2 uncontrolled Continue with the current medication regimen including Lantus and glyburide along with sliding scale coverage. Last HbA1c that we have in our system is from March 2014 and it was 7.6. Repeat level is pending.  Essential Hypertension Continue home medications.  Hyperlipidemia Continue statins.  Peripheral arterial disease Continue home medications. He has good pulsations in the right dorsalis pedis. Continue with his antiplatelet agents. No need to stop Plavix per orthopedics.  History of OSA Noncompliant with CPAP  Hypokalemia. Repleted orally.  Normocytic anemia. Monitor CBC closely. No overt bleeding.  DVT Prophylaxis: Lovenox    Code Status: Full code  Family Communication: Discussed with the patient  Disposition Plan: Not ready for discharge    LOS: 2 days   [facility] Pager [phone] 08/26/2014, 8:09 AM  If 8PM-8AM, please contact night-coverage at [email or web address], password [facility]

## 2014-08-27 ENCOUNTER — Inpatient Hospital Stay (HOSPITAL_COMMUNITY): Payer: BC Managed Care – PPO | Admitting: Anesthesiology

## 2014-08-27 ENCOUNTER — Encounter (HOSPITAL_COMMUNITY): Payer: Self-pay | Admitting: Certified Registered"

## 2014-08-27 ENCOUNTER — Encounter (HOSPITAL_COMMUNITY)
Admission: EM | Disposition: A | Discharge status: Home / Self Care | Payer: Self-pay | Source: Home / Self Care | Attending: Internal Medicine

## 2014-08-27 HISTORY — PX: AMPUTATION: SHX166

## 2014-08-27 LAB — GLUCOSE, CAPILLARY
Glucose-Capillary: 85 mg/dL (ref 70–99)
Glucose-Capillary: 74 mg/dL (ref 70–99)
Glucose-Capillary: 130 mg/dL — ABNORMAL HIGH (ref 70–99)
Glucose-Capillary: 102 mg/dL — ABNORMAL HIGH (ref 70–99)
Glucose-Capillary: 87 mg/dL (ref 70–99)
Glucose-Capillary: 81 mg/dL (ref 70–99)

## 2014-08-27 SURGERY — AMPUTATION, FOOT, PARTIAL
Anesthesia: Regional | Site: Foot | Laterality: Right

## 2014-08-27 MED ORDER — GENTAMICIN SULFATE 40 MG/ML IJ SOLN
INTRAMUSCULAR | Status: AC
  Filled 2014-08-27: qty 4

## 2014-08-27 MED ORDER — HYDROMORPHONE HCL 1 MG/ML IJ SOLN: 0.2500 mg | INTRAMUSCULAR | Status: DC | PRN

## 2014-08-27 MED ORDER — FENTANYL CITRATE 0.05 MG/ML IJ SOLN
50.0000 ug | INTRAMUSCULAR | Status: DC | PRN
  Administered 2014-08-27: 100 ug via INTRAVENOUS
  Filled 2014-08-27: qty 2

## 2014-08-27 MED ORDER — METHOCARBAMOL 500 MG PO TABS: 500.0000 mg | ORAL_TABLET | Freq: Four times a day (QID) | ORAL | Status: DC | PRN

## 2014-08-27 MED ORDER — FENTANYL CITRATE 0.05 MG/ML IJ SOLN
INTRAMUSCULAR | Status: AC
  Filled 2014-08-27: qty 5

## 2014-08-27 MED ORDER — MIDAZOLAM HCL 2 MG/2ML IJ SOLN
INTRAMUSCULAR | Status: AC
  Filled 2014-08-27: qty 2

## 2014-08-27 MED ORDER — ONDANSETRON HCL 4 MG/2ML IJ SOLN: 4.0000 mg | Freq: Four times a day (QID) | INTRAMUSCULAR | Status: DC | PRN

## 2014-08-27 MED ORDER — GENTAMICIN SULFATE 40 MG/ML IJ SOLN
INTRAMUSCULAR | Status: DC | PRN
Start: 2014-08-27 — End: 2014-08-27
  Administered 2014-08-27: 80 mg via INTRAMUSCULAR

## 2014-08-27 MED ORDER — METHOCARBAMOL 1000 MG/10ML IJ SOLN
500.0000 mg | Freq: Four times a day (QID) | INTRAVENOUS | Status: DC | PRN
  Filled 2014-08-27: qty 5

## 2014-08-27 MED ORDER — VANCOMYCIN HCL 500 MG IV SOLR
INTRAVENOUS | Status: DC | PRN
  Administered 2014-08-27: 500 mg

## 2014-08-27 MED ORDER — LACTATED RINGERS IV SOLN
INTRAVENOUS | Status: DC | PRN
  Administered 2014-08-27: 18:00:00 via INTRAVENOUS

## 2014-08-27 MED ORDER — METOCLOPRAMIDE HCL 5 MG/ML IJ SOLN: 5.0000 mg | Freq: Three times a day (TID) | INTRAMUSCULAR | Status: DC | PRN

## 2014-08-27 MED ORDER — LACTATED RINGERS IV SOLN
INTRAVENOUS | Status: DC
  Administered 2014-08-27: 16:00:00 via INTRAVENOUS

## 2014-08-27 MED ORDER — ROPIVACAINE HCL 5 MG/ML IJ SOLN
INTRAMUSCULAR | Status: DC | PRN
  Administered 2014-08-27: 20 mL

## 2014-08-27 MED ORDER — HYDROMORPHONE HCL 1 MG/ML IJ SOLN: 0.5000 mg | INTRAMUSCULAR | Status: DC | PRN

## 2014-08-27 MED ORDER — 0.9 % SODIUM CHLORIDE (POUR BTL) OPTIME
TOPICAL | Status: DC | PRN
  Administered 2014-08-27: 1000 mL

## 2014-08-27 MED ORDER — OXYCODONE-ACETAMINOPHEN 5-325 MG PO TABS
1.0000 | ORAL_TABLET | ORAL | Status: DC | PRN
  Administered 2014-08-28: 2 via ORAL
  Filled 2014-08-27: qty 2

## 2014-08-27 MED ORDER — VANCOMYCIN HCL 500 MG IV SOLR
INTRAVENOUS | Status: AC
  Filled 2014-08-27: qty 500

## 2014-08-27 MED ORDER — METOCLOPRAMIDE HCL 5 MG PO TABS: 5.0000 mg | ORAL_TABLET | Freq: Three times a day (TID) | ORAL | Status: DC | PRN

## 2014-08-27 MED ORDER — PROPOFOL INFUSION 10 MG/ML OPTIME
INTRAVENOUS | Status: DC | PRN
  Administered 2014-08-27: 50 ug/kg/min via INTRAVENOUS

## 2014-08-27 MED ORDER — ONDANSETRON HCL 4 MG PO TABS: 4.0000 mg | ORAL_TABLET | Freq: Four times a day (QID) | ORAL | Status: DC | PRN

## 2014-08-27 MED ORDER — DOCUSATE SODIUM 100 MG PO CAPS
100.0000 mg | ORAL_CAPSULE | Freq: Two times a day (BID) | ORAL | Status: DC
  Administered 2014-08-27 – 2014-08-28 (×2): 100 mg via ORAL
  Filled 2014-08-27 (×2): qty 1

## 2014-08-27 MED ORDER — PROMETHAZINE HCL 25 MG/ML IJ SOLN: 6.2500 mg | INTRAMUSCULAR | Status: DC | PRN

## 2014-08-27 MED ORDER — MEPERIDINE HCL 25 MG/ML IJ SOLN: 6.2500 mg | INTRAMUSCULAR | Status: DC | PRN

## 2014-08-27 MED ORDER — SODIUM CHLORIDE 0.9 % IV SOLN
INTRAVENOUS | Status: DC
  Administered 2014-08-27: 500 mL via INTRAVENOUS

## 2014-08-27 MED ORDER — MIDAZOLAM HCL 2 MG/2ML IJ SOLN
1.0000 mg | INTRAMUSCULAR | Status: DC | PRN
  Administered 2014-08-27: 2 mg via INTRAVENOUS
  Filled 2014-08-27: qty 2

## 2014-08-27 SURGICAL SUPPLY — 32 items
BLADE SAW SGTL HD 18.5X60.5X1. (BLADE) ×2 IMPLANT
BLADE SURG 10 STRL SS (BLADE) IMPLANT
BNDG COHESIVE 4X5 TAN STRL (GAUZE/BANDAGES/DRESSINGS) ×4 IMPLANT
BNDG COHESIVE 6X5 TAN STRL LF (GAUZE/BANDAGES/DRESSINGS) ×1 IMPLANT
BNDG GAUZE ELAST 4 BULKY (GAUZE/BANDAGES/DRESSINGS) ×3 IMPLANT
COVER SURGICAL LIGHT HANDLE (MISCELLANEOUS) ×2 IMPLANT
DRAPE U-SHAPE 47X51 STRL (DRAPES) ×2 IMPLANT
DRSG ADAPTIC 3X8 NADH LF (GAUZE/BANDAGES/DRESSINGS) ×2 IMPLANT
DRSG PAD ABDOMINAL 8X10 ST (GAUZE/BANDAGES/DRESSINGS) ×2 IMPLANT
DURAPREP 26ML APPLICATOR (WOUND CARE) ×2 IMPLANT
ELECT REM PT RETURN 9FT ADLT (ELECTROSURGICAL) ×2
ELECTRODE REM PT RTRN 9FT ADLT (ELECTROSURGICAL) ×1 IMPLANT
GAUZE SPONGE 4X4 12PLY STRL (GAUZE/BANDAGES/DRESSINGS) ×2 IMPLANT
GLOVE BIOGEL PI IND STRL 9 (GLOVE) ×1 IMPLANT
GLOVE BIOGEL PI INDICATOR 9 (GLOVE) ×1
GLOVE SURG ORTHO 9.0 STRL STRW (GLOVE) ×2 IMPLANT
GOWN STRL REUS W/ TWL XL LVL3 (GOWN DISPOSABLE) ×3 IMPLANT
GOWN STRL REUS W/TWL XL LVL3 (GOWN DISPOSABLE) ×6
KIT BASIN OR (CUSTOM PROCEDURE TRAY) ×2 IMPLANT
KIT ROOM TURNOVER OR (KITS) ×2 IMPLANT
KIT STIMULAN RAPID CURE 5CC (Orthopedic Implant) ×1 IMPLANT
NS IRRIG 1000ML POUR BTL (IV SOLUTION) ×2 IMPLANT
PACK ORTHO EXTREMITY (CUSTOM PROCEDURE TRAY) ×2 IMPLANT
PAD ABD 8X10 STRL (GAUZE/BANDAGES/DRESSINGS) ×1 IMPLANT
PAD ARMBOARD 7.5X6 YLW CONV (MISCELLANEOUS) ×4 IMPLANT
SPONGE GAUZE 4X4 12PLY STER LF (GAUZE/BANDAGES/DRESSINGS) ×1 IMPLANT
SPONGE LAP 18X18 X RAY DECT (DISPOSABLE) ×2 IMPLANT
SUT ETHILON 2 0 PSLX (SUTURE) ×6 IMPLANT
SUT VIC AB 2-0 CTB1 (SUTURE) IMPLANT
TOWEL OR 17X24 6PK STRL BLUE (TOWEL DISPOSABLE) ×2 IMPLANT
TOWEL OR 17X26 10 PK STRL BLUE (TOWEL DISPOSABLE) ×2 IMPLANT
WATER STERILE IRR 1000ML POUR (IV SOLUTION) ×2 IMPLANT

## 2014-08-27 NOTE — Interval H&P Note (Signed)
History and Physical Interval Note:  08/27/2014 6:46 AM  [name]  has presented today for surgery, with the diagnosis of Osteomyelitis Right Foot  The various methods of treatment have been discussed with the patient and family. After consideration of risks, benefits and other options for treatment, the patient has consented to  Procedure(s): Transmetatarsal Amputation (Right) as a surgical intervention .  The patient's history has been reviewed, patient examined, no change in status, stable for surgery.  I have reviewed the patient's chart and labs.  Questions were answered to the patient's satisfaction.     Rosanna Bickle V

## 2014-08-27 NOTE — Op Note (Signed)
08/24/2014 - 08/27/2014  7:33 PM  PATIENT:  [name]    PRE-OPERATIVE DIAGNOSIS:  Osteomyelitis Right Foot fifth and fourth metatarsal  POST-OPERATIVE DIAGNOSIS:  Same  PROCEDURE:  Fourth and fifth ray amputation right foot. Local tissue rearrangement for wound closure 3 x 7 cm. Placement of antibiotic beads with stimulant 500 mg vancomycin 160 mg gentamicin  SURGEON:  [name],[name], MD  PHYSICIAN ASSISTANT:None ANESTHESIA:   General  PREOPERATIVE INDICATIONS:  [name] is a  58 y.o. male with a diagnosis of Osteomyelitis Right Foot who failed conservative measures and elected for surgical management.    The risks benefits and alternatives were discussed with the patient preoperatively including but not limited to the risks of infection, bleeding, nerve injury, cardiopulmonary complications, the need for revision surgery, among others, and the patient was willing to proceed.  OPERATIVE IMPLANTS: Stimulant beads  OPERATIVE FINDINGS: No abscess  OPERATIVE PROCEDURE: Patient is a 58 year old gentleman who is developed ulcer down to the fifth and fourth metatarsals. He has been on IV antibiotics and presents at this time for foot salvage with amputation of the fourth and fifth rays. Risks and benefits were discussed including persistent infection neurovascular injury nonhealing of the wound. Patient states he understands was to proceed at this time.  Patient was brought to the operating room and underwent an ankle block in the holding area. After adequate levels of anesthesia were obtained patient's right lower extremity was prepped using DuraPrep draped into a sterile field. A timeout was called. An elliptical incision was made around the wound as well as a longitudinally over the fifth metatarsal. The base of the fifth metatarsal was protected and amputation was performed just distal to the base of the fifth metatarsal through the fifth and fourth metatarsal. The metatarsals were  removed. The wound was irrigated with normal saline. There is no purulence there was good granulation tissue. Antibiotic beads were placed deep within the wound. A local tissue rearrangement was performed to close the wound which was 3 x 7 cm. The wound was closed using 2-0 nylon. A sterile compressive dressing was applied. Patient was taken to the PACU in stable condition.

## 2014-08-27 NOTE — Progress Notes (Signed)
Patient should be safe for discharge to home this weekend. He has local antibiotics and should not need any systemic antibiotics. He needs to be strict nonweightbearing on the right.

## 2014-08-27 NOTE — Transfer of Care (Signed)
Immediate Anesthesia Transfer of Care Note  Patient: [name]  Procedure(s) Performed: Procedure(s): Transmetatarsal Amputation (Right)  Patient Location: PACU  Anesthesia Type:MAC combined with regional for post-op pain  Level of Consciousness: awake, alert  and oriented  Airway & Oxygen Therapy: Patient Spontanous Breathing and Patient connected to nasal cannula oxygen  Post-op Assessment: Report given to PACU RN and Post -op Vital signs reviewed and stable  Post vital signs: Reviewed and stable  Complications: No apparent anesthesia complications

## 2014-08-27 NOTE — Interval H&P Note (Signed)
History and Physical Interval Note:  08/27/2014 12:29 PM  [place]  has presented today for surgery, with the diagnosis of Osteomyelitis Right Foot  The various methods of treatment have been discussed with the patient and family. After consideration of risks, benefits and other options for treatment, the patient has consented to  Procedure(s): Transmetatarsal Amputation (Right) as a surgical intervention .  The patient's history has been reviewed, patient examined, no change in status, stable for surgery.  I have reviewed the patient's chart and labs.  Questions were answered to the patient's satisfaction.     Leonarda Leis V

## 2014-08-27 NOTE — Anesthesia Preprocedure Evaluation (Addendum)
Anesthesia Evaluation  Patient identified by MRN, date of birth, ID band Patient awake    Reviewed: Allergy & Precautions, H&P , NPO status , Patient's Chart, lab work & pertinent test results  History of Anesthesia Complications Negative for: history of anesthetic complications  Airway Mallampati: I  TM Distance: >3 FB Neck ROM: Full    Dental  (+) Teeth Intact, Dental Advisory Given, Upper Dentures, Edentulous Upper   Pulmonary sleep apnea , COPDformer smoker,  breath sounds clear to auscultation        Cardiovascular hypertension, Pt. on medications + Peripheral Vascular Disease Rhythm:Regular Rate:Normal     Neuro/Psych negative neurological ROS  negative psych ROS   GI/Hepatic Neg liver ROS, hiatal hernia, GERD-  Medicated and Controlled,  Endo/Other  diabetes, Well Controlled, Type 2, Oral Hypoglycemic AgentsMorbid obesity  Renal/GU negative Renal ROS     Musculoskeletal  (+) Arthritis -,   Abdominal   Peds  Hematology negative hematology ROS (+)   Anesthesia Other Findings   Reproductive/Obstetrics                           Anesthesia Physical  Anesthesia Plan  ASA: III  Anesthesia Plan: Regional   Post-op Pain Management:    Induction:   Airway Management Planned:   Additional Equipment:   Intra-op Plan:   Post-operative Plan:   Informed Consent: I have reviewed the patients History and Physical, chart, labs and discussed the procedure including the risks, benefits and alternatives for the proposed anesthesia with the patient or authorized representative who has indicated his/her understanding and acceptance.   Dental advisory given  Plan Discussed with: CRNA  Anesthesia Plan Comments:        Anesthesia Quick Evaluation

## 2014-08-27 NOTE — Anesthesia Procedure Notes (Addendum)
Anesthesia Regional Block:  Ankle block  Pre-Anesthetic Checklist: ,, timeout performed, Correct Patient, Correct Site, Correct Laterality, Correct Procedure, Correct Position, site marked, Risks and benefits discussed,  Surgical consent,  Pre-op evaluation,  At surgeon's request and post-op pain management  Laterality: Right  Prep: chloraprep       Needles:  Injection technique: Single-shot     Needle Length: 4cm 4 cm Needle Gauge: 20 and 20 G    Additional Needles: Ankle block Narrative:  Injection made incrementally with aspirations every 5 mL.  Performed by: Personally  Anesthesiologist: Nickie Retort  Additional Notes: Tolerated well. Good blockade/anesthesia.    Procedure Name: MAC Date/Time: 08/27/2014 6:55 PM Performed by: Eligha Bridegroom Pre-anesthesia Checklist: Patient identified, Timeout performed, Emergency Drugs available, Suction available and Patient being monitored Patient Re-evaluated:Patient Re-evaluated prior to inductionOxygen Delivery Method: Nasal cannula Intubation Type: IV induction

## 2014-08-27 NOTE — H&P (View-Only) (Signed)
RI have reviewed the patient's current medications.eason for Consult: Osteomyelitis right foot fifth metatarsal status post fifth and fourth ray amputations Referring Physician: Dr. [name] is an 58 y.o. male.  HPI: Patient is a 58 year old gentleman with diabetes peripheral vascular disease status post stents placed in [place]. Patient is developed acute ulcer over the fifth metatarsal right foot. Patient was admitted for IV antibiotics.  Past Medical History  Diagnosis Date  . Carotid artery occlusion 11/10/10    LEFT CAROTID ENDARTERECTOMY  . Diabetes mellitus   . Hypertension   . Hyperlipidemia   . COPD (chronic obstructive pulmonary disease)   . DJD (degenerative joint disease)   . GERD (gastroesophageal reflux disease)   . Substernal chest pain   . 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 Slurred speech     AS PER WIFE IN D/C NOTE 11/10/10  . PAD (peripheral artery disease)     Distal aortogram June 2012. Atherectomy left popliteal artery July 2012.   . Barrett's esophagus   . Fatty liver   . Diverticulosis of colon (without mention of hemorrhage)   . Emphysema of lung   . Osteomyelitis   . Complication of anesthesia     BP WENT UP AT [facility] "  . Sleep apnea     has not used CPAP in e years - took self off  . H/O hiatal hernia   . Neuromuscular disorder     peripheral neuropathy  . Full dentures   . Wears glasses     Past Surgical History  Procedure Laterality Date  . Laminectomy      X 3 LUMBAR AND X 2 CERVICAL SPINE OPERATIONS  . Carotid endarterectomy  11/10/10  . Hernia repair      LEFT INGUINAL AND UMBILICAL REPAIRS  . Anterior fusion cervical spine  02/06/06    C4-5, C5-6, C6-7; SURGEON DR. MAX COHEN  . Penile prosthesis implant  08/14/05    INFRAPUBIC INSERTION OF INFLATABLE PENILE PROSTHESIS; SURGEON DR. Amalia Hailey  . Laparoscopic cholecystectomy w/ cholangiography  11/09/04    SURGEON DR. Luella Cook  . Total knee arthroplasty   07/2002    RIGHT KNEE ; SURGEON  DR. [name] ALSO HAD ARTHROSCOPIC RIGHT KNEE IN  10/2001  . Shoulder arthroscopy    . Cholecystectomy    . Amputation  11/05/2011    Procedure: AMPUTATION RAY;  Surgeon: Wylene Simmer, MD;  Location: [place];  Service: Orthopedics;  Laterality: Right;  Amputation of Right 4&5th Toes  . Femoral artery stent      x6  . Amputation Left 11/26/2012    Procedure: AMPUTATION RAY;  Surgeon: Wylene Simmer, MD;  Location: [place];  Service: Orthopedics;  Laterality: Left;  fourth ray amputation  . Cardiac catheterization  10/31/04    2009  . Carpal tunnel release Right 10/21/2013    Procedure: RIGHT CARPAL TUNNEL RELEASE;  Surgeon: Wynonia Sours, MD;  Location: [place];  Service: Orthopedics;  Laterality: Right;  . Ulnar nerve transposition Right 10/21/2013    Procedure: RIGHT ELBOW  ULNAR NERVE DECOMPRESSION;  Surgeon: Wynonia Sours, MD;  Location: [place];  Service: Orthopedics;  Laterality: Right;  . Joint replacement Right 2001    Total  . Spine surgery    . Neck surgery    . Esophageal manometry Bilateral 07/19/2014    Procedure: ESOPHAGEAL MANOMETRY (EM);  Surgeon: Jerene Bears, MD;  Location: [facility];  Service: Gastroenterology;  Laterality: Bilateral;  . Lower extremity angiogram N/A 03/19/2012    Procedure: LOWER EXTREMITY ANGIOGRAM;  Surgeon: Burnell Blanks, MD;  Location: [facility] C[facility] LAB;  Service: Cardiovascular;  Laterality: N/A;    Family History  Problem Relation Age of Onset  . Heart disease Father     Before age 43-  CAD, BPG  . Diabetes Father     Amputation  . Cancer Father     PROSTATE  . Hyperlipidemia Father   . Hypertension Father   . Heart attack Father     Triple BPG  . Arthritis      GRANDMOTHER  . Hypertension      OTHER FAMILY MEMBERS  . Colon cancer Brother   . Cancer Brother     Colon  . Diabetes Brother   . Heart disease Brother 1    A-Fib. Before age 36  . Hyperlipidemia Brother   .  Hypertension Brother   . Cancer Sister     Breast    Social History:  reports that he quit smoking about 2 years ago. His smoking use included Cigarettes. He has a 17.5 pack-year smoking history. He uses smokeless tobacco. He reports that he does not drink alcohol or use illicit drugs.  Allergies:  Allergies  Allergen Reactions  . Ivp Dye [Iodinated Diagnostic Agents] Anaphylaxis    Breathing problems  . Adhesive [Tape] Rash    Medications: I have reviewed the patient's current medications.  Results for orders placed or performed during the hospital encounter of 08/24/14 (from the past 48 hour(s))  CBC with Differential     Status: Abnormal   Collection Time: 08/24/14  8:13 PM  Result Value Ref Range   WBC 8.5 4.0 - 10.5 K/uL   RBC 3.76 (L) 4.22 - 5.81 MIL/uL   Hemoglobin 11.5 (L) 13.0 - 17.0 g/dL   HCT 31.6 (L) 39.0 - 52.0 %   MCV 84.0 78.0 - 100.0 fL   MCH 30.6 26.0 - 34.0 pg   MCHC 36.4 (H) 30.0 - 36.0 g/dL   RDW 12.8 11.5 - 15.5 %   Platelets 96 (L) 150 - 400 K/uL    Comment: PLATELET COUNT CONFIRMED BY SMEAR REPEATED TO VERIFY SPECIMEN CHECKED FOR CLOTS    Neutrophils Relative % 75 43 - 77 %   Neutro Abs 6.4 1.7 - 7.7 K/uL   Lymphocytes Relative 16 12 - 46 %   Lymphs Abs 1.3 0.7 - 4.0 K/uL   Monocytes Relative 8 3 - 12 %   Monocytes Absolute 0.7 0.1 - 1.0 K/uL   Eosinophils Relative 1 0 - 5 %   Eosinophils Absolute 0.1 0.0 - 0.7 K/uL   Basophils Relative 0 0 - 1 %   Basophils Absolute 0.0 0.0 - 0.1 K/uL  Comprehensive metabolic panel     Status: Abnormal   Collection Time: 08/24/14  8:13 PM  Result Value Ref Range   Sodium 135 (L) 137 - 147 mEq/L   Potassium 3.9 3.7 - 5.3 mEq/L   Chloride 95 (L) 96 - 112 mEq/L   CO2 25 19 - 32 mEq/L   Glucose, Bld 224 (H) 70 - 99 mg/dL   BUN 12 6 - 23 mg/dL   Creatinine, Ser 1.07 0.50 - 1.35 mg/dL   Calcium 8.6 8.4 - 10.5 mg/dL   Total Protein 7.1 6.0 - 8.3 g/dL   Albumin 3.8 3.5 - 5.2 g/dL   AST 15 0 - 37 U/L  ALT 11 0  - 53 U/L   Alkaline Phosphatase 125 (H) 39 - 117 U/L   Total Bilirubin 0.6 0.3 - 1.2 mg/dL   GFR calc non Af Amer 74 (L) >90 mL/min   GFR calc Af Amer 85 (L) >90 mL/min    Comment: (NOTE) The eGFR has been calculated using the CKD EPI equation. This calculation has not been validated in all clinical situations. eGFR's persistently <90 mL/min signify possible Chronic Kidney Disease.    Anion gap 15 5 - 15  I-Stat CG4 Lactic Acid, ED     Status: None   Collection Time: 08/24/14  8:29 PM  Result Value Ref Range   Lactic Acid, Venous 1.70 0.5 - 2.2 mmol/L  CBG monitoring, ED     Status: Abnormal   Collection Time: 08/25/14 12:25 AM  Result Value Ref Range   Glucose-Capillary 194 (H) 70 - 99 mg/dL  Glucose, capillary     Status: Abnormal   Collection Time: 08/25/14  6:17 AM  Result Value Ref Range   Glucose-Capillary 149 (H) 70 - 99 mg/dL  Sedimentation rate     Status: Abnormal   Collection Time: 08/25/14  7:39 AM  Result Value Ref Range   Sed Rate 38 (H) 0 - 16 mm/hr  Comprehensive metabolic panel     Status: Abnormal   Collection Time: 08/25/14  7:39 AM  Result Value Ref Range   Sodium 138 137 - 147 mEq/L   Potassium 3.2 (L) 3.7 - 5.3 mEq/L   Chloride 98 96 - 112 mEq/L   CO2 28 19 - 32 mEq/L   Glucose, Bld 150 (H) 70 - 99 mg/dL   BUN 14 6 - 23 mg/dL   Creatinine, Ser 1.18 0.50 - 1.35 mg/dL   Calcium 8.6 8.4 - 10.5 mg/dL   Total Protein 6.6 6.0 - 8.3 g/dL   Albumin 3.4 (L) 3.5 - 5.2 g/dL   AST 10 0 - 37 U/L   ALT 10 0 - 53 U/L   Alkaline Phosphatase 114 39 - 117 U/L   Total Bilirubin 0.5 0.3 - 1.2 mg/dL   GFR calc non Af Amer 65 (L) >90 mL/min   GFR calc Af Amer 76 (L) >90 mL/min    Comment: (NOTE) The eGFR has been calculated using the CKD EPI equation. This calculation has not been validated in all clinical situations. eGFR's persistently <90 mL/min signify possible Chronic Kidney Disease.    Anion gap 12 5 - 15  CBC with Differential     Status: Abnormal    Collection Time: 08/25/14  7:39 AM  Result Value Ref Range   WBC 7.8 4.0 - 10.5 K/uL   RBC 3.66 (L) 4.22 - 5.81 MIL/uL   Hemoglobin 10.8 (L) 13.0 - 17.0 g/dL   HCT 30.3 (L) 39.0 - 52.0 %   MCV 82.8 78.0 - 100.0 fL   MCH 29.5 26.0 - 34.0 pg   MCHC 35.6 30.0 - 36.0 g/dL   RDW 12.8 11.5 - 15.5 %   Platelets 184 150 - 400 K/uL    Comment: REPEATED TO VERIFY   Neutrophils Relative % 70 43 - 77 %   Neutro Abs 5.5 1.7 - 7.7 K/uL   Lymphocytes Relative 18 12 - 46 %   Lymphs Abs 1.4 0.7 - 4.0 K/uL   Monocytes Relative 11 3 - 12 %   Monocytes Absolute 0.8 0.1 - 1.0 K/uL   Eosinophils Relative 2 0 - 5 %   Eosinophils Absolute  0.1 0.0 - 0.7 K/uL   Basophils Relative 0 0 - 1 %   Basophils Absolute 0.0 0.0 - 0.1 K/uL  Glucose, capillary     Status: Abnormal   Collection Time: 08/25/14 11:36 AM  Result Value Ref Range   Glucose-Capillary 182 (H) 70 - 99 mg/dL  Glucose, capillary     Status: Abnormal   Collection Time: 08/25/14  4:35 PM  Result Value Ref Range   Glucose-Capillary 215 (H) 70 - 99 mg/dL    Mr Foot Right W Wo Contrast  08/25/2014   CLINICAL DATA:  1 cm soft tissue ulceration and cellulitis at the lateral aspect of the right foot. 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  EXAM: MRI OF THE RIGHT FOREFOOT WITHOUT AND WITH CONTRAST  TECHNIQUE: Multiplanar, multisequence MR imaging was performed both before and after administration of intravenous contrast.  CONTRAST:  60m MULTIHANCE GADOBENATE DIMEGLUMINE 529 MG/ML IV SOLN  COMPARISON:  MRI dated 11/01/2011 and radiographs dated 08/24/2014  FINDINGS: There is soft tissue ulceration just lateral to the stump of the fifth metatarsal with an area of devitalized soft tissue measuring approximately 27 mm in diameter. There is enhancing soft tissue around the devitalized area consistent with cellulitis. This extends onto the dorsum of the foot distal to the stump of the fifth metatarsal. There is  no bulb new bone destruction or abnormal edema or enhancement in the stump of the fifth metatarsal.  There are no joint effusions. 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  IMPRESSION: 1. Soft tissue ulceration of the lateral aspect of the foot with no deep ulceration or underlying osteomyelitis. There is adjacent cellulitis. 2. The area of devitalized soft tissue are at the site of the ulcer is approximately 2.7 cm in diameter and approximately 9 mm deep.   Electronically Signed   By: JRozetta NunneryM.D.   On: 08/25/2014 09:35   Dg Foot Complete Right  08/24/2014   CLINICAL DATA:  Wound abscess, lateral right foot for 9 days  EXAM: RIGHT FOOT COMPLETE - 3+ VIEW  COMPARISON:  August 17, 2014  FINDINGS: Frontal, oblique, and lateral views were obtained. The patient has had amputations at the levels of the mid fourth and fifth metatarsals, stable. Soft tissue air distal to the fourth metatarsal appears essentially stable and may represent soft tissue infection. No well-defined abscess is not seen radiographically. There is no acute fracture or dislocation. There is advanced osteoarthritic change in the first MTP joint. There is moderate narrowing of all remaining PIP and DIP joints, stable. There is osteoarthritic change in the dorsal midfoot with spurring and narrowing, stable. There are small posterior and inferior calcaneal spurs. No demonstrable osteomyelitis.  IMPRESSION: Areas of extensive osteoarthritic change, stable. Amputation at the mid fourth and fifth metatarsal levels. No bony destruction seen. Soft tissue air distal to the fourth metatarsal may represent soft tissue infection; well-defined abscess not seen.  If there remains concern for osteomyelitis clinically, MR or nuclear medicine three-phase bone scan potentially could be helpful to further assess.   Electronically Signed   By: WLowella Grip M.D.   On: 08/24/2014 21:52    Review of Systems  All other systems reviewed and are negative.  Blood pressure 153/64, pulse 64, temperature 99 F (37.2 C), temperature source Oral, resp. rate 18, height 6' 2"  (1.88 m), weight 113.399 kg (  250 lb), SpO2 99 %. Physical Exam On examination patient has a strong dorsalis pedis pulse on the right. He has an ulcer over the fifth metatarsal which does probe to bone. There is no a centering cellulitis no purulence. Review of the MRI scan does show ulcer which extends to the fifth metatarsal and is down to the fourth metatarsal as well. Assessment/Plan: Assessment: Osteomyelitis right foot fourth and fifth rays status post partial ray amputations with a good dorsalis pedis pulse with diabetic insensate neuropathy.  Plan: I feel that it is worth trying of further foot salvage intervention. We'll plan for a amputation through the fifth and fourth rays and placement of antibiotic beads. Plan for surgery on Friday afternoon with patient able to be discharged on Saturday. Risks and benefits of surgery were discussed with the patient he states he understands and wishes to proceed at this time. He is currently on Plavix to keep his stents open. I recommend that he continue the Plavix.  [name],[name] 08/25/2014, 6:09 PM

## 2014-08-27 NOTE — Progress Notes (Signed)
[facility] PROGRESS NOTE  [name] [id number] DOB: 03-18-1956 DOA: 08/24/2014  PCP: [name], MD  Brief HPI: 58 year old Caucasian male with the history of diabetes, peripheral artery disease, hypertension, who presented with redness and ulceration in the lateral aspect of his right foot starting about 10 days prior to admission. He was referred to the emergency department by his podiatrist. There was a concern that he might have osteomyelitis along with cellulitis. MRI did not suggest osteomyelitis. He was seen by orthopedic surgeon, and plan is for surgical intervention on December 18.  Past medical history:  Past Medical History  Diagnosis Date  . Carotid artery occlusion 11/10/10    LEFT CAROTID ENDARTERECTOMY  . Diabetes mellitus   . Hypertension   . Hyperlipidemia   . COPD (chronic obstructive pulmonary disease)   . DJD (degenerative joint disease)   . GERD (gastroesophageal reflux disease)   . Substernal chest pain   . 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 Slurred speech     AS PER WIFE IN D/C NOTE 11/10/10  . PAD (peripheral artery disease)     Distal aortogram June 2012. Atherectomy left popliteal artery July 2012.   . Barrett's esophagus   . Fatty liver   . Diverticulosis of colon (without mention of hemorrhage)   . Emphysema of lung   . Osteomyelitis   . Complication of anesthesia     BP WENT UP AT [facility] "  . Sleep apnea     has not used CPAP in e years - took self off  . H/O hiatal hernia   . Neuromuscular disorder     peripheral neuropathy  . Full dentures   . Wears glasses     Consultants: Orthopedics  Procedures: None  Antibiotics: Vanc and Cefepime 12/15-->  Subjective: Patient anxious about his surgery this evening. Pain is reasonably well controlled.   Objective: Vital Signs  Filed Vitals:   08/26/14 0600 08/26/14 1037 08/26/14 1457 08/26/14 1750  BP: 130/60 161/60 130/51 161/66  Pulse: 51 51  50 71  Temp: 98.2 F (36.8 C)  98.7 F (37.1 C)   TempSrc: Oral  Oral   Resp: 20  20   Height:      Weight:      SpO2: 97%  95%    No intake or output data in the 24 hours ending 08/27/14 0757 Filed Weights   08/24/14 1955  Weight: 113.399 kg (250 lb)    General appearance: alert, cooperative, appears stated age and no distress Resp: clear to auscultation bilaterally Cardio: regular rate and rhythm, S1, S2 normal, no murmur, click, rub or gallop GI: soft, non-tender; bowel sounds normal; no masses,  no organomegaly Extremities: Dressing was not removed today. 12/16 findings: There is a ulcer measuring about 3-4 cm in diameter on the lateral aspect of the right foot. No active drainage is noted. Some yellowish exudate is seen. There is slight erythema surrounding it. Tender to palpation.  Lab Results:  Basic Metabolic Panel:  Recent Labs Lab 08/24/14 2013 08/25/14 0739 08/26/14 0430  NA 135* 138 141  K 3.9 3.2* 4.1  CL 95* 98 103  CO2 25 28 27   GLUCOSE 224* 150* 81  BUN 12 14 15   CREATININE 1.07 1.18 1.09  CALCIUM 8.6 8.6 8.9   Liver Function Tests:  Recent Labs Lab 08/24/14 2013 08/25/14 0739  AST 15 10  ALT 11 10  ALKPHOS 125* 114  BILITOT 0.6 0.5  PROT 7.1 6.6  ALBUMIN 3.8 3.4*   CBC:  Recent Labs Lab 08/24/14 2013 08/25/14 0739 08/26/14 0430  WBC 8.5 7.8 8.2  NEUTROABS 6.4 5.5  --   HGB 11.5* 10.8* 10.3*  HCT 31.6* 30.3* 29.8*  MCV 84.0 82.8 83.2  PLT 96* 184 186   CBG:  Recent Labs Lab 08/26/14 0709 08/26/14 1119 08/26/14 1634 08/26/14 2206 08/27/14 0653  GLUCAP 122* 244* 146* 260* 85     Studies/Results: Mr Foot Right W Wo Contrast  08/25/2014   CLINICAL DATA:  1 cm soft tissue ulceration and cellulitis at the lateral aspect of the right foot. 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  EXAM: MRI OF THE RIGHT FOREFOOT WITHOUT AND WITH CONTRAST  TECHNIQUE: Multiplanar, multisequence  MR imaging was performed both before and after administration of intravenous contrast.  CONTRAST:  77mL MULTIHANCE GADOBENATE DIMEGLUMINE 529 MG/ML IV SOLN  COMPARISON:  MRI dated 11/01/2011 and radiographs dated 08/24/2014  FINDINGS: There is soft tissue ulceration just lateral to the stump of the fifth metatarsal with an area of devitalized soft tissue measuring approximately 27 mm in diameter. There is enhancing soft tissue around the devitalized area consistent with cellulitis. This extends onto the dorsum of the foot distal to the stump of the fifth metatarsal. There is no bulb new bone destruction or abnormal edema or enhancement in the stump of the fifth metatarsal.  There are no joint effusions. 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  IMPRESSION: 1. Soft tissue ulceration of the lateral aspect of the foot with no deep ulceration or underlying osteomyelitis. There is adjacent cellulitis. 2. The area of devitalized soft tissue are at the site of the ulcer is approximately 2.7 cm in diameter and approximately 9 mm deep.   Electronically Signed   By: Rozetta Nunnery M.D.   On: 08/25/2014 09:35    Medications:  Scheduled: . ALPRAZolam  0.25 mg Oral TID  . amLODipine  5 mg Oral QHS  . aspirin EC  81 mg Oral Daily  . atorvastatin  40 mg Oral Daily  . carvedilol  25 mg Oral q morning - 10a  . carvedilol  6.25 mg Oral QPM  . ceFEPime (MAXIPIME) IV  1 g Intravenous Q8H  . chlorhexidine  60 mL Topical Once  . clopidogrel  75 mg Oral Daily  . enoxaparin (LOVENOX) injection  40 mg Subcutaneous Q24H  . ezetimibe  10 mg Oral q morning - 10a  . fentaNYL  50 mcg Transdermal Q72H  . furosemide  80 mg Oral Daily  . glyBURIDE  5 mg Oral BID WC  . insulin aspart  0-9 Units Subcutaneous TID WC  . insulin glargine  50 Units Subcutaneous QHS  . irbesartan  150 mg Oral QHS  . Linaclotide  145 mcg Oral Daily  .  omega-3 acid ethyl esters  2,000 mg Oral BID  . pantoprazole  40 mg Oral Daily  . pregabalin  100 mg Oral TID  . rOPINIRole  2 mg Oral QHS  . sodium chloride  3 mL Intravenous Q12H  . tamsulosin  0.4 mg Oral Daily  . vancomycin  1,000 mg Intravenous Q8H   Continuous:  [id number] **OR** acetaminophen, HYDROmorphone (DILAUDID) injection, nitroGLYCERIN, ondansetron **OR** ondansetron (ZOFRAN) IV  Assessment/Plan:  Principal Problem:   Cellulitis of foot, right Active Problems:  PAD (peripheral artery disease)   Diabetes mellitus type 2, controlled   Essential hypertension   Hyperlipidemia   Cellulitis of foot without toes, right    Diabetic foot ulcer with cellulitis of the right foot MRI did not show osteomyelitis but the bone is exposed. Sedimentation ratio was only minimally elevated. Orthopedics was consulted. They're recommending partial amputation of that area. This is planned for Friday afternoon. Continue with intravenous antibiotics. Unfortunately, blood cultures were not sent.  Diabetes mellitus type 2 uncontrolled Continue with the current medication regimen including Lantus and glyburide along with sliding scale coverage. Last HbA1c that we have in our system is from March 2014 and it was 7.6. Repeat level is 6.8.  Essential Hypertension Continue home medications. Blood pressure is reasonably well controlled.  Hyperlipidemia Continue statins.  Peripheral arterial disease Continue home medications. He has good pulsations in the right dorsalis pedis. Continue with his antiplatelet agents. No need to stop Plavix per orthopedics.  History of OSA Noncompliant with CPAP  Hypokalemia. Repleted orally.  Normocytic anemia. Monitor CBC closely. No overt bleeding.  DVT Prophylaxis: Lovenox    Code Status: Full code  Family Communication: Discussed with the patient  Disposition Plan: Not ready for discharge. Await surgery.    LOS: 3 days    [facility] Pager [phone] 08/27/2014, 7:57 AM  If 8PM-8AM, please contact night-coverage at [email or web address], password [email or web address]

## 2014-08-27 NOTE — Progress Notes (Signed)
Orthopedic Tech Progress Note Patient Details:  [name] 03/17/1956 [id number]  Ortho Devices Type of Ortho Device: Postop shoe/boot Ortho Device/Splint Location: RLE Ortho Device/Splint Interventions: Ordered, Application   Braulio Bosch 08/27/2014, 9:15 PM

## 2014-08-27 NOTE — Anesthesia Postprocedure Evaluation (Signed)
  Anesthesia Post-op Note  Patient: [name]  Procedure(s) Performed: Procedure(s): Transmetatarsal Amputation (Right)  Patient Location: PACU  Anesthesia Type:Regional  Level of Consciousness: awake, alert , oriented and patient cooperative  Airway and Oxygen Therapy: Patient Spontanous Breathing  Post-op Pain: none  Post-op Assessment: Post-op Vital signs reviewed, Patient's Cardiovascular Status Stable, Respiratory Function Stable, Patent Airway, No signs of Nausea or vomiting and Pain level controlled  Post-op Vital Signs: stable  Last Vitals:  Filed Vitals:   08/27/14 1930  BP: 114/56  Pulse: 50  Temp: 36.9 C  Resp: 12    Complications: No apparent anesthesia complications

## 2014-08-28 LAB — CBC
HCT: 28.4 % — ABNORMAL LOW (ref 39.0–52.0)
Hemoglobin: 10 g/dL — ABNORMAL LOW (ref 13.0–17.0)
MCH: 29.3 pg (ref 26.0–34.0)
MCHC: 35.2 g/dL (ref 30.0–36.0)
MCV: 83.3 fL (ref 78.0–100.0)
Platelets: 191 10*3/uL (ref 150–400)
RBC: 3.41 MIL/uL — ABNORMAL LOW (ref 4.22–5.81)
RDW: 12.9 % (ref 11.5–15.5)
WBC: 5.9 10*3/uL (ref 4.0–10.5)

## 2014-08-28 LAB — COMPREHENSIVE METABOLIC PANEL
ALT: 14 U/L (ref 0–53)
AST: 16 U/L (ref 0–37)
Albumin: 2.8 g/dL — ABNORMAL LOW (ref 3.5–5.2)
Alkaline Phosphatase: 107 U/L (ref 39–117)
Anion gap: 8 (ref 5–15)
BUN: 16 mg/dL (ref 6–23)
CO2: 29 mEq/L (ref 19–32)
Calcium: 9.1 mg/dL (ref 8.4–10.5)
Chloride: 101 mEq/L (ref 96–112)
Creatinine, Ser: 1.01 mg/dL (ref 0.50–1.35)
GFR calc Af Amer: >90 mL/min (ref >90)
GFR calc non Af Amer: 79 mL/min — ABNORMAL LOW (ref >90)
Glucose, Bld: 228 mg/dL — ABNORMAL HIGH (ref 70–99)
Potassium: 3.9 mEq/L (ref 3.7–5.3)
Sodium: 138 mEq/L (ref 137–147)
Total Bilirubin: 0.2 mg/dL — ABNORMAL LOW (ref 0.3–1.2)
Total Protein: 5.9 g/dL — ABNORMAL LOW (ref 6.0–8.3)

## 2014-08-28 LAB — GLUCOSE, CAPILLARY
Glucose-Capillary: 194 mg/dL — ABNORMAL HIGH (ref 70–99)
Glucose-Capillary: 142 mg/dL — ABNORMAL HIGH (ref 70–99)
Glucose-Capillary: 164 mg/dL — ABNORMAL HIGH (ref 70–99)

## 2014-08-28 MED ORDER — OXYCODONE-ACETAMINOPHEN 5-325 MG PO TABS: 1.0000 | ORAL_TABLET | Freq: Three times a day (TID) | ORAL | Status: DC | PRN

## 2014-08-28 NOTE — Progress Notes (Signed)
Patient ID: [name], male   DOB: [birth year], 58 y.o.   MRN: [id number] Can be discharged to home from an ortho ([name]) standpoint.

## 2014-08-28 NOTE — Progress Notes (Signed)
Patient d/c home, d/c instruction given and patient verbalized understanding. Condition stable.

## 2014-08-28 NOTE — Progress Notes (Signed)
tPatient awake and alert . No complaints.Dressing clean dry and intact right foot.  Non weight bearing right foot . Follow up with [name]

## 2014-08-28 NOTE — Evaluation (Signed)
Physical Therapy Evaluation Patient Details Name: [name] MRN: [id number] DOB: October 06, 1955 Today's Date: 08/28/2014   History of Present Illness  Adm 08/24/14 due to Rt foot infection. 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 All education has been completed and the patient has no further questions.  See below for any follow-up Physial Therapy or equipment needs. PT is signing off. Thank you for this referral.     Follow Up Recommendations No PT follow up;Supervision for mobility/OOB    Equipment Recommendations  None recommended by PT    Recommendations for Other Services       Precautions / Restrictions Precautions Precautions: Fall Required Braces or Orthoses: Other Brace/Splint Other Brace/Splint: Rt post-op shoe Restrictions Weight Bearing Restrictions: Yes RLE Weight Bearing: Non weight bearing      Mobility  Bed Mobility Overal bed mobility: Modified Independent                Transfers Overall transfer level: Needs assistance Equipment used: Rolling walker (2 wheeled);Crutches Transfers: Sit to/from Stand Sit to Stand: Supervision;Min guard         General transfer comment: supervision with cues for safe use of RW; minguard for use of cruttches for safety; x 3  Ambulation/Gait Ambulation/Gait assistance: Min guard Ambulation Distance (Feet): 25 Feet (+25 again) Assistive device: Rolling walker (2 wheeled);Crutches Gait Pattern/deviations: Step-to pattern     General Gait Details: able to maintain NWB RLE with each device  Stairs            Wheelchair Mobility    Modified Rankin (Stroke Patients Only)       Balance Overall balance assessment: Modified Independent (no loss of balance)                                            Pertinent Vitals/Pain Pain Assessment: 0-10 Pain Score: 7  Pain Location: Rt foot Pain Intervention(s): Limited activity within patient's tolerance;Monitored during session;Premedicated before session;Repositioned    Home Living Family/patient expects to be discharged to:: Private residence Living Arrangements: Spouse/significant other;Children (stepson 40 yo-handicap) Available Help at Discharge: Family;Available 24 hours/day (for 2 days, then wife works days) Type of Home: Mobile home Home Access: Ramped entrance     [place]: One level Home Equipment: Crutches;Walker - 2 wheels;Wheelchair - manual (handicap height toilet)      Prior Function Level of Independence: Independent         Comments: disabled     Hand Dominance        Extremity/Trunk Assessment   Upper Extremity Assessment: Overall WFL for tasks assessed           Lower Extremity Assessment: Overall WFL for tasks assessed      Cervical / Trunk Assessment: Normal  Communication   Communication: No difficulties  Cognition Arousal/Alertness: Awake/alert Behavior During Therapy: WFL for tasks assessed/performed Overall Cognitive Status: Within Functional Limits for tasks assessed                      General Comments General comments (skin integrity, edema, etc.): Educated on elevation of leg to reduce edema    Exercises  Assessment/Plan    PT Assessment Patent does not need any further PT services  PT Diagnosis Acute pain;Difficulty walking   PT Problem List    PT Treatment Interventions     PT Goals (Current goals can be found in the Care Plan section) Acute Rehab PT Goals Patient Stated Goal: go home today PT Goal Formulation: All assessment and education complete, DC therapy    Frequency     Barriers to discharge        Co-evaluation               End of Session Equipment Utilized During Treatment: Gait belt;Other (comment)  (Rt post-op shoe) Activity Tolerance: Patient tolerated treatment well Patient left: in bed;with call bell/phone within reach;Other (comment) (MD in room) Nurse Communication: Mobility status;Other (comment);Weight bearing status (OK for d/c)         Time: [id number] PT Time Calculation (min) (ACUTE ONLY): 32 min   Charges:   PT Evaluation $Initial PT Evaluation Tier I: 1 Procedure PT Treatments $Gait Training: 8-22 mins   PT G Codes:          Zuly Belkin 09-14-2014, 11:37 AM  Pager [phone]

## 2014-08-28 NOTE — Discharge Instructions (Signed)
No weight on your right foot at all. Keep your current dressing in place until your follow-up with [name].

## 2014-08-28 NOTE — Discharge Summary (Signed)
Physician Discharge Summary  [name] [id number] DOB: 1955-10-20 DOA: 08/24/2014  PCP: [name], MD  Admit date: 08/24/2014 Discharge date: 08/28/2014  Time spent: 30 minutes  Recommendations for Outpatient Follow-up:  1. Follow up with [name] as scheduled 2. Follow up with PCP in 1-2 weeks  Discharge Diagnoses:  Principal Problem:   Cellulitis of foot, right Active Problems:   PAD (peripheral artery disease)   Diabetes mellitus type 2, controlled   Essential hypertension   Hyperlipidemia   Cellulitis of foot without toes, right   Discharge Condition: Improved  Diet recommendation: diabetic, heart healthy  Filed Weights   08/24/14 1955  Weight: 113.399 kg (250 lb)    History of present illness:  Please see admit h and p from 12/16 for details. Briefly, pt presents with worsening R foot diabetic wound. The patient was admitted for further work up.  Hospital Course:  Diabetic foot ulcer with cellulitis of the right foot MRI did not show osteomyelitis but the bone was exposed. Sedimentation ratio was only minimally elevated. Orthopedics was consulted with recommendation of partial amputation of that area, which was ultimately done on 12/18. No complications noted. Pt was seen again by Ortho on 12/19 with recommendations to discharge today.  Diabetes mellitus type 2 uncontrolled Continue with the current medication regimen including Lantus and glyburide along with sliding scale coverage. Last HbA1c that we have in our system is from March 2014 and it was 7.6. Repeat level is 6.8.  Essential Hypertension Continue home medications. Blood pressure is reasonably well controlled.  Hyperlipidemia Continue statins.  Peripheral arterial disease Continue home medications. He has good pulsations in the right dorsalis pedis. Continue with his antiplatelet agents. No need to stop Plavix per orthopedics.  History of OSA Noncompliant with  CPAP  Hypokalemia. Repleted orally.  Normocytic anemia. Monitor CBC closely. No overt bleeding.  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  Consultations:  Orthopedic surgery  Discharge Exam: Filed Vitals:   08/27/14 2147 08/28/14 0220 08/28/14 0621 08/28/14 1010  BP: 149/61 137/57 152/60 128/52  Pulse: 57 59 64 58  Temp: 98 F (36.7 C) 98.1 F (36.7 C) 98.4 F (36.9 C) 97.8 F (36.6 C)  TempSrc: Oral Oral Oral Oral  Resp: 18 18 18 17   Height:      Weight:      SpO2: 100% 99% 100% 96%   General: awake, in nad Cardiovascular: regular, s1, s2 Respiratory: normal resp effort, no wheezing  Discharge Instructions     Medication List    TAKE these medications        ALPRAZolam 0.5 MG tablet  Commonly known as:  XANAX  Take 0.25 mg by mouth 3 (three) times daily.     aspirin EC 81 MG tablet  Take 81 mg by mouth daily.     atorvastatin 40 MG tablet  Commonly known as:  LIPITOR  Take 40 mg by mouth daily.     AZOR 5-20 MG per tablet  Generic drug:  amLODipine-olmesartan  Take 1 tablet by mouth daily.     B-complex with vitamin C tablet  Take 1 tablet by mouth daily.     carvedilol 6.25 MG tablet  Commonly known as:  COREG  Take 6.25 mg by mouth every evening.     carvedilol 25 MG tablet  Commonly known as:  COREG  Take 25 mg by mouth every morning.     clopidogrel 75 MG tablet  Commonly known as:  PLAVIX  Take 75 mg by mouth daily. 1 tab daily     ezetimibe 10 MG tablet  Commonly known as:  ZETIA  Take 10 mg by mouth every morning.     fentaNYL 50 MCG/HR  Commonly known as:  DURAGESIC - dosed mcg/hr  Place 1 patch onto the skin every 3 (three) days.     furosemide 80 MG tablet  Commonly known as:  LASIX  Take 80 mg by mouth daily.     glyBURIDE-metformin 2.5-500 MG per tablet  Commonly known as:  GLUCOVANCE  Take 2 tablets by mouth 2 (two) times daily.     HYDROmorphone 2 MG tablet  Commonly known as:  DILAUDID  Take 1 tablet  (2 mg total) by mouth every 4 (four) hours as needed for pain.     insulin glargine 100 UNIT/ML injection  Commonly known as:  LANTUS  Inject 50 Units into the skin at bedtime.     insulin glulisine 100 UNIT/ML injection  Commonly known as:  APIDRA  Inject 10-20 Units into the skin 3 (three) times daily before meals. 10 breakfast 10 lunch and 20 supper     lansoprazole 30 MG capsule  Commonly known as:  PREVACID  Take 30 mg by mouth 2 (two) times daily.     Linaclotide 145 MCG Caps capsule  Commonly known as:  LINZESS  Take 1 capsule (145 mcg total) by mouth daily.     multivitamin tablet  Take 1 tablet by mouth daily.     nitroGLYCERIN 0.4 MG SL tablet  Commonly known as:  NITROSTAT  Place 1 tablet (0.4 mg total) under the tongue every 5 (five) minutes as needed (as needed for esophageal spasm).     omega-3 acid ethyl esters 1 G capsule  Commonly known as:  LOVAZA  Take 2,000 mg by mouth 2 (two) times daily.     ondansetron 4 MG disintegrating tablet  Commonly known as:  ZOFRAN-ODT  Take 4 mg by mouth 4 (four) times daily as needed for nausea.     oxyCODONE-acetaminophen 5-325 MG per tablet  Commonly known as:  PERCOCET/ROXICET  Take 1 tablet by mouth every 8 (eight) hours as needed for pain.     pregabalin 100 MG capsule  Commonly known as:  LYRICA  Take 100 mg by mouth 3 (three) times daily.     pregabalin 75 MG capsule  Commonly known as:  LYRICA  Take 75 mg by mouth 3 (three) times daily.     rOPINIRole 2 MG tablet  Commonly known as:  REQUIP  Take 2 mg by mouth at bedtime.     tamsulosin 0.4 MG Caps capsule  Commonly known as:  FLOMAX  Take 0.4 mg by mouth daily.       Allergies  Allergen Reactions  . Ivp Dye [Iodinated Diagnostic Agents] Anaphylaxis    Breathing problems  . Adhesive [Tape] Rash   Follow-up Information    Follow up with [name],[name], MD In 2 weeks.   Specialty:  Orthopedic Surgery   Contact information:   [place] [address] [phone]       Follow up with [name], MD. Go in 1 week.   Specialty:  Endocrinology   Contact information:   [address] [place] [address] [phone]        The results of significant diagnostics from this hospitalization (including imaging, microbiology, ancillary and laboratory) are listed below for reference.  Significant Diagnostic Studies: Mr Foot Right W Wo Contrast  08/25/2014   CLINICAL DATA:  1 cm soft tissue ulceration and cellulitis at the lateral aspect of the right foot. 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  EXAM: MRI OF THE RIGHT FOREFOOT WITHOUT AND WITH CONTRAST  TECHNIQUE: Multiplanar, multisequence MR imaging was performed both before and after administration of intravenous contrast.  CONTRAST:  95mL MULTIHANCE GADOBENATE DIMEGLUMINE 529 MG/ML IV SOLN  COMPARISON:  MRI dated 11/01/2011 and radiographs dated 08/24/2014  FINDINGS: There is soft tissue ulceration just lateral to the stump of the fifth metatarsal with an area of devitalized soft tissue measuring approximately 27 mm in diameter. There is enhancing soft tissue around the devitalized area consistent with cellulitis. This extends onto the dorsum of the foot distal to the stump of the fifth metatarsal. There is no bulb new bone destruction or abnormal edema or enhancement in the stump of the fifth metatarsal.  There are no joint effusions. 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  IMPRESSION: 1. Soft tissue ulceration of the lateral aspect of the foot with no deep ulceration or underlying osteomyelitis. There is adjacent cellulitis. 2. The area of devitalized soft tissue are at the site of the ulcer is approximately 2.7 cm in diameter and approximately 9 mm deep.   Electronically Signed   By: Rozetta Nunnery  M.D.   On: 08/25/2014 09:35   Dg Foot Complete Right  08/24/2014   CLINICAL DATA:  Wound abscess, lateral right foot for 9 days  EXAM: RIGHT FOOT COMPLETE - 3+ VIEW  COMPARISON:  August 17, 2014  FINDINGS: Frontal, oblique, and lateral views were obtained. The patient has had amputations at the levels of the mid fourth and fifth metatarsals, stable. Soft tissue air distal to the fourth metatarsal appears essentially stable and may represent soft tissue infection. No well-defined abscess is not seen radiographically. There is no acute fracture or dislocation. There is advanced osteoarthritic change in the first MTP joint. There is moderate narrowing of all remaining PIP and DIP joints, stable. There is osteoarthritic change in the dorsal midfoot with spurring and narrowing, stable. There are small posterior and inferior calcaneal spurs. No demonstrable osteomyelitis.  IMPRESSION: Areas of extensive osteoarthritic change, stable. Amputation at the mid fourth and fifth metatarsal levels. No bony destruction seen. Soft tissue air distal to the fourth metatarsal may represent soft tissue infection; well-defined abscess not seen.  If there remains concern for osteomyelitis clinically, MR or nuclear medicine three-phase bone scan potentially could be helpful to further assess.   Electronically Signed   By: Lowella Grip M.D.   On: 08/24/2014 21:52   Dg Foot Complete Right  08/17/2014   CLINICAL DATA:  Foot pain. Infection in the right lateral foot. Previous amputations and diabetic complications.  EXAM: RIGHT FOOT COMPLETE - 3+ VIEW  COMPARISON:  04/20/2014  FINDINGS: Previous amputations of the right fourth and fifth toes at the mid metatarsal region. There is interval development of soft tissue lucency distal to the fourth metatarsal stump. This suggests soft tissue infection. There is no evidence of bone erosion or bone irregularity. No findings to suggest osteomyelitis. Prominent degenerative changes in the  first metatarsal-phalangeal joint with severe loss of joint space and prominent hypertrophic osteophytes. No acute fracture or dislocation. Degenerative changes in the intertarsal joints.  IMPRESSION: Previous mid metatarsal fourth  and fifth toe amputations. Soft tissue gas collection distal to the fourth metatarsal consistent with infection. No changes to suggest osteomyelitis. Severe degenerative changes in the first metatarsal-phalangeal joint.   Electronically Signed   By: Lucienne Capers M.D.   On: 08/17/2014 03:40    Microbiology: No results found for this or any previous visit (from the past 240 hour(s)).   Labs: Basic Metabolic Panel:  Recent Labs Lab 08/24/14 2013 08/25/14 0739 08/26/14 0430 08/28/14 0516  NA 135* 138 141 138  K 3.9 3.2* 4.1 3.9  CL 95* 98 103 101  CO2 25 28 27 29   GLUCOSE 224* 150* 81 228*  BUN 12 14 15 16   CREATININE 1.07 1.18 1.09 1.01  CALCIUM 8.6 8.6 8.9 9.1   Liver Function Tests:  Recent Labs Lab 08/24/14 2013 08/25/14 0739 08/28/14 0516  AST 15 10 16   ALT 11 10 14   ALKPHOS 125* 114 107  BILITOT 0.6 0.5 0.2*  PROT 7.1 6.6 5.9*  ALBUMIN 3.8 3.4* 2.8*   No results for input(s): LIPASE, AMYLASE in the last 168 hours. No results for input(s): AMMONIA in the last 168 hours. CBC:  Recent Labs Lab 08/24/14 2013 08/25/14 0739 08/26/14 0430 08/28/14 0516  WBC 8.5 7.8 8.2 5.9  NEUTROABS 6.4 5.5  --   --   HGB 11.5* 10.8* 10.3* 10.0*  HCT 31.6* 30.3* 29.8* 28.4*  MCV 84.0 82.8 83.2 83.3  PLT 96* 184 186 191   Cardiac Enzymes: No results for input(s): CKTOTAL, CKMB, CKMBINDEX, TROPONINI in the last 168 hours. BNP: BNP (last 3 results) No results for input(s): PROBNP in the last 8760 hours. CBG:  Recent Labs Lab 08/27/14 1138 08/27/14 1845 08/27/14 2000 08/27/14 2138 08/28/14 0651  GLUCAP 130* 102* 87 81 194*   Signed:  Nashid Pellum K  [facility] 08/28/2014, 11:30 AM

## 2014-08-29 NOTE — Progress Notes (Signed)
CARE MANAGEMENT NOTE 08/29/2014  Patient:  QUIENTON, WEEDMAN   Account Number:  [id number]  Date Initiated:  08/29/2014  Documentation initiated by:  [facility]  Subjective/Objective Assessment:   Cellulitis of foot, right     Action/Plan:   Anticipated DC Date:  08/28/2014   Anticipated DC Plan:  [place]  CM consult      Choice offered to / List presented to:             Status of service:  Completed, signed off Medicare Important Message given?   (If response is "NO", the following Medicare IM given date fields will be blank) Date Medicare IM given:   Medicare IM given by:   Date Additional Medicare IM given:   Additional Medicare IM given by:    Discharge Disposition:  HOME/SELF CARE  Per UR Regulation:    If discussed at Long Length of Stay Meetings, dates discussed:    Comments:  No NCM needs identified. Jonnie Finner RN CCM Case Mgmt phone [phone]

## 2014-08-31 ENCOUNTER — Encounter (HOSPITAL_COMMUNITY): Payer: Self-pay | Admitting: Orthopedic Surgery

## 2014-09-26 ENCOUNTER — Encounter (HOSPITAL_COMMUNITY): Payer: Self-pay | Admitting: *Deleted

## 2014-09-26 ENCOUNTER — Inpatient Hospital Stay (HOSPITAL_COMMUNITY)
Admission: EM | Admit: 2014-09-26 | Discharge: 2014-09-30 | DRG: 565 | Disposition: A | Discharge status: Home / Self Care | Payer: BLUE CROSS/BLUE SHIELD | Attending: Internal Medicine | Admitting: Internal Medicine

## 2014-09-26 ENCOUNTER — Emergency Department (HOSPITAL_COMMUNITY): Payer: BLUE CROSS/BLUE SHIELD

## 2014-09-26 ENCOUNTER — Inpatient Hospital Stay (HOSPITAL_COMMUNITY): Payer: BLUE CROSS/BLUE SHIELD

## 2014-09-26 DIAGNOSIS — A4901 Methicillin susceptible Staphylococcus aureus infection, unspecified site: Secondary | ICD-10-CM | POA: Diagnosis present

## 2014-09-26 DIAGNOSIS — Z91041 Radiographic dye allergy status: Secondary | ICD-10-CM | POA: Diagnosis not present

## 2014-09-26 DIAGNOSIS — E1142 Type 2 diabetes mellitus with diabetic polyneuropathy: Secondary | ICD-10-CM | POA: Diagnosis present

## 2014-09-26 DIAGNOSIS — IMO0001 Reserved for inherently not codable concepts without codable children: Secondary | ICD-10-CM

## 2014-09-26 DIAGNOSIS — Z91048 Other nonmedicinal substance allergy status: Secondary | ICD-10-CM

## 2014-09-26 DIAGNOSIS — T8781 Dehiscence of amputation stump: Secondary | ICD-10-CM | POA: Diagnosis present

## 2014-09-26 DIAGNOSIS — L02611 Cutaneous abscess of right foot: Secondary | ICD-10-CM | POA: Diagnosis present

## 2014-09-26 DIAGNOSIS — Z981 Arthrodesis status: Secondary | ICD-10-CM | POA: Diagnosis not present

## 2014-09-26 DIAGNOSIS — E785 Hyperlipidemia, unspecified: Secondary | ICD-10-CM | POA: Diagnosis present

## 2014-09-26 DIAGNOSIS — I10 Essential (primary) hypertension: Secondary | ICD-10-CM | POA: Diagnosis present

## 2014-09-26 DIAGNOSIS — L089 Local infection of the skin and subcutaneous tissue, unspecified: Secondary | ICD-10-CM | POA: Diagnosis present

## 2014-09-26 DIAGNOSIS — J449 Chronic obstructive pulmonary disease, unspecified: Secondary | ICD-10-CM | POA: Diagnosis present

## 2014-09-26 DIAGNOSIS — Z794 Long term (current) use of insulin: Secondary | ICD-10-CM | POA: Diagnosis not present

## 2014-09-26 DIAGNOSIS — L97529 Non-pressure chronic ulcer of other part of left foot with unspecified severity: Secondary | ICD-10-CM | POA: Diagnosis not present

## 2014-09-26 DIAGNOSIS — Z89421 Acquired absence of other right toe(s): Secondary | ICD-10-CM | POA: Diagnosis not present

## 2014-09-26 DIAGNOSIS — B999 Unspecified infectious disease: Secondary | ICD-10-CM | POA: Diagnosis present

## 2014-09-26 DIAGNOSIS — T814XXA Infection following a procedure, initial encounter: Secondary | ICD-10-CM | POA: Diagnosis present

## 2014-09-26 DIAGNOSIS — M4806 Spinal stenosis, lumbar region: Secondary | ICD-10-CM | POA: Diagnosis present

## 2014-09-26 DIAGNOSIS — K2270 Barrett's esophagus without dysplasia: Secondary | ICD-10-CM | POA: Diagnosis present

## 2014-09-26 DIAGNOSIS — T8743 Infection of amputation stump, right lower extremity: Principal | ICD-10-CM | POA: Diagnosis present

## 2014-09-26 DIAGNOSIS — I444 Left anterior fascicular block: Secondary | ICD-10-CM | POA: Diagnosis present

## 2014-09-26 DIAGNOSIS — K219 Gastro-esophageal reflux disease without esophagitis: Secondary | ICD-10-CM | POA: Diagnosis present

## 2014-09-26 DIAGNOSIS — R05 Cough: Secondary | ICD-10-CM

## 2014-09-26 DIAGNOSIS — I2510 Atherosclerotic heart disease of native coronary artery without angina pectoris: Secondary | ICD-10-CM | POA: Diagnosis present

## 2014-09-26 DIAGNOSIS — Z7902 Long term (current) use of antithrombotics/antiplatelets: Secondary | ICD-10-CM

## 2014-09-26 DIAGNOSIS — I739 Peripheral vascular disease, unspecified: Secondary | ICD-10-CM

## 2014-09-26 DIAGNOSIS — R748 Abnormal levels of other serum enzymes: Secondary | ICD-10-CM | POA: Diagnosis present

## 2014-09-26 DIAGNOSIS — Z79891 Long term (current) use of opiate analgesic: Secondary | ICD-10-CM

## 2014-09-26 DIAGNOSIS — E119 Type 2 diabetes mellitus without complications: Secondary | ICD-10-CM

## 2014-09-26 DIAGNOSIS — L03115 Cellulitis of right lower limb: Secondary | ICD-10-CM | POA: Diagnosis present

## 2014-09-26 DIAGNOSIS — Z7982 Long term (current) use of aspirin: Secondary | ICD-10-CM | POA: Diagnosis not present

## 2014-09-26 DIAGNOSIS — T8131XA Disruption of external operation (surgical) wound, not elsewhere classified, initial encounter: Secondary | ICD-10-CM | POA: Diagnosis present

## 2014-09-26 DIAGNOSIS — Z89411 Acquired absence of right great toe: Secondary | ICD-10-CM | POA: Diagnosis not present

## 2014-09-26 DIAGNOSIS — N400 Enlarged prostate without lower urinary tract symptoms: Secondary | ICD-10-CM | POA: Diagnosis present

## 2014-09-26 DIAGNOSIS — Z87891 Personal history of nicotine dependence: Secondary | ICD-10-CM | POA: Diagnosis not present

## 2014-09-26 DIAGNOSIS — L03116 Cellulitis of left lower limb: Secondary | ICD-10-CM | POA: Diagnosis present

## 2014-09-26 DIAGNOSIS — T148XXA Other injury of unspecified body region, initial encounter: Secondary | ICD-10-CM

## 2014-09-26 DIAGNOSIS — E11621 Type 2 diabetes mellitus with foot ulcer: Secondary | ICD-10-CM

## 2014-09-26 DIAGNOSIS — I5032 Chronic diastolic (congestive) heart failure: Secondary | ICD-10-CM | POA: Diagnosis present

## 2014-09-26 DIAGNOSIS — M869 Osteomyelitis, unspecified: Secondary | ICD-10-CM | POA: Diagnosis present

## 2014-09-26 DIAGNOSIS — Z95820 Peripheral vascular angioplasty status with implants and grafts: Secondary | ICD-10-CM

## 2014-09-26 DIAGNOSIS — T8140XA Infection following a procedure, unspecified, initial encounter: Secondary | ICD-10-CM

## 2014-09-26 DIAGNOSIS — Z79899 Other long term (current) drug therapy: Secondary | ICD-10-CM | POA: Diagnosis not present

## 2014-09-26 DIAGNOSIS — R059 Cough, unspecified: Secondary | ICD-10-CM

## 2014-09-26 DIAGNOSIS — R110 Nausea: Secondary | ICD-10-CM

## 2014-09-26 DIAGNOSIS — L97519 Non-pressure chronic ulcer of other part of right foot with unspecified severity: Secondary | ICD-10-CM

## 2014-09-26 LAB — GLUCOSE, CAPILLARY: Glucose-Capillary: 236 mg/dL — ABNORMAL HIGH (ref 70–99)

## 2014-09-26 LAB — CBC WITH DIFFERENTIAL/PLATELET
Basophils Absolute: 0 10*3/uL (ref 0.0–0.1)
Basophils Relative: 1 % (ref 0–1)
Eosinophils Absolute: 0.1 10*3/uL (ref 0.0–0.7)
Eosinophils Relative: 2 % (ref 0–5)
HCT: 30 % — ABNORMAL LOW (ref 39.0–52.0)
Hemoglobin: 10.6 g/dL — ABNORMAL LOW (ref 13.0–17.0)
Lymphocytes Relative: 18 % (ref 12–46)
Lymphs Abs: 0.9 10*3/uL (ref 0.7–4.0)
MCH: 29 pg (ref 26.0–34.0)
MCHC: 35.3 g/dL (ref 30.0–36.0)
MCV: 82 fL (ref 78.0–100.0)
Monocytes Absolute: 0.7 10*3/uL (ref 0.1–1.0)
Monocytes Relative: 13 % — ABNORMAL HIGH (ref 3–12)
Neutro Abs: 3.3 10*3/uL (ref 1.7–7.7)
Neutrophils Relative %: 66 % (ref 43–77)
Platelets: 167 10*3/uL (ref 150–400)
RBC: 3.66 MIL/uL — ABNORMAL LOW (ref 4.22–5.81)
RDW: 13 % (ref 11.5–15.5)
WBC: 5 10*3/uL (ref 4.0–10.5)

## 2014-09-26 LAB — COMPREHENSIVE METABOLIC PANEL
ALT: 32 U/L (ref 0–53)
AST: 35 U/L (ref 0–37)
Albumin: 3.7 g/dL (ref 3.5–5.2)
Alkaline Phosphatase: 119 U/L — ABNORMAL HIGH (ref 39–117)
Anion gap: 10 (ref 5–15)
BUN: 21 mg/dL (ref 6–23)
CO2: 25 mmol/L (ref 19–32)
Calcium: 8.9 mg/dL (ref 8.4–10.5)
Chloride: 101 mEq/L (ref 96–112)
Creatinine, Ser: 1.28 mg/dL (ref 0.50–1.35)
GFR calc Af Amer: 69 mL/min — ABNORMAL LOW (ref >90)
GFR calc non Af Amer: 59 mL/min — ABNORMAL LOW (ref >90)
Glucose, Bld: 265 mg/dL — ABNORMAL HIGH (ref 70–99)
Potassium: 3.9 mmol/L (ref 3.5–5.1)
Sodium: 136 mmol/L (ref 135–145)
Total Bilirubin: 0.7 mg/dL (ref 0.3–1.2)
Total Protein: 6.4 g/dL (ref 6.0–8.3)

## 2014-09-26 LAB — SEDIMENTATION RATE: Sed Rate: 30 mm/hr — ABNORMAL HIGH (ref 0–16)

## 2014-09-26 LAB — CBG MONITORING, ED: Glucose-Capillary: 193 mg/dL — ABNORMAL HIGH (ref 70–99)

## 2014-09-26 LAB — ALBUMIN: Albumin: 3.6 g/dL (ref 3.5–5.2)

## 2014-09-26 MED ORDER — PREGABALIN 75 MG PO CAPS
75.0000 mg | ORAL_CAPSULE | Freq: Three times a day (TID) | ORAL | Status: DC
  Administered 2014-09-26 – 2014-09-27 (×4): 75 mg via ORAL
  Filled 2014-09-26 (×4): qty 1

## 2014-09-26 MED ORDER — FENTANYL 50 MCG/HR TD PT72
50.0000 ug | MEDICATED_PATCH | TRANSDERMAL | Status: DC
  Administered 2014-09-28: 50 ug via TRANSDERMAL
  Filled 2014-09-26 (×2): qty 1

## 2014-09-26 MED ORDER — B COMPLEX-C PO TABS
1.0000 | ORAL_TABLET | Freq: Every day | ORAL | Status: DC
  Administered 2014-09-27 – 2014-09-30 (×4): 1 via ORAL
  Filled 2014-09-26 (×4): qty 1

## 2014-09-26 MED ORDER — INSULIN GLARGINE 100 UNIT/ML ~~LOC~~ SOLN
50.0000 [IU] | Freq: Every day | SUBCUTANEOUS | Status: DC
  Administered 2014-09-26 – 2014-09-28 (×3): 50 [IU] via SUBCUTANEOUS
  Filled 2014-09-26 (×4): qty 0.5

## 2014-09-26 MED ORDER — ONDANSETRON HCL 4 MG/2ML IJ SOLN
4.0000 mg | Freq: Four times a day (QID) | INTRAMUSCULAR | Status: DC | PRN
  Administered 2014-09-28 – 2014-09-29 (×2): 4 mg via INTRAVENOUS
  Filled 2014-09-26 (×2): qty 2

## 2014-09-26 MED ORDER — HYDROMORPHONE HCL 1 MG/ML IJ SOLN
1.0000 mg | INTRAMUSCULAR | Status: DC | PRN
  Administered 2014-09-26 – 2014-09-30 (×11): 1 mg via INTRAVENOUS
  Filled 2014-09-26 (×11): qty 1

## 2014-09-26 MED ORDER — VANCOMYCIN HCL 10 G IV SOLR
2000.0000 mg | Freq: Once | INTRAVENOUS | Status: AC
  Administered 2014-09-26: 2000 mg via INTRAVENOUS
  Filled 2014-09-26: qty 2000

## 2014-09-26 MED ORDER — SODIUM CHLORIDE 0.9 % IV SOLN
1.0000 g | Freq: Three times a day (TID) | INTRAVENOUS | Status: DC
  Administered 2014-09-26 – 2014-09-27 (×2): 1 g via INTRAVENOUS
  Filled 2014-09-26 (×4): qty 1

## 2014-09-26 MED ORDER — ROPINIROLE HCL 1 MG PO TABS
2.0000 mg | ORAL_TABLET | Freq: Every day | ORAL | Status: DC
  Administered 2014-09-26 – 2014-09-29 (×4): 2 mg via ORAL
  Filled 2014-09-26 (×5): qty 2

## 2014-09-26 MED ORDER — ONDANSETRON 4 MG PO TBDP
4.0000 mg | ORAL_TABLET | Freq: Once | ORAL | Status: AC
  Administered 2014-09-26: 4 mg via ORAL
  Filled 2014-09-26: qty 1

## 2014-09-26 MED ORDER — NITROGLYCERIN 0.4 MG SL SUBL: 0.4000 mg | SUBLINGUAL_TABLET | SUBLINGUAL | Status: DC | PRN

## 2014-09-26 MED ORDER — ALPRAZOLAM 0.25 MG PO TABS
0.2500 mg | ORAL_TABLET | Freq: Three times a day (TID) | ORAL | Status: DC
  Administered 2014-09-26 – 2014-09-30 (×11): 0.25 mg via ORAL
  Filled 2014-09-26 (×11): qty 1

## 2014-09-26 MED ORDER — HYDROCODONE-ACETAMINOPHEN 5-325 MG PO TABS
1.0000 | ORAL_TABLET | ORAL | Status: DC | PRN
  Administered 2014-09-27 – 2014-09-30 (×7): 2 via ORAL
  Filled 2014-09-26 (×7): qty 2

## 2014-09-26 MED ORDER — PANTOPRAZOLE SODIUM 20 MG PO TBEC
20.0000 mg | DELAYED_RELEASE_TABLET | Freq: Every day | ORAL | Status: DC
  Administered 2014-09-27 – 2014-09-30 (×4): 20 mg via ORAL
  Filled 2014-09-26 (×4): qty 1

## 2014-09-26 MED ORDER — FUROSEMIDE 80 MG PO TABS
80.0000 mg | ORAL_TABLET | Freq: Every day | ORAL | Status: DC
  Administered 2014-09-27 – 2014-09-30 (×4): 80 mg via ORAL
  Filled 2014-09-26 (×3): qty 1
  Filled 2014-09-26: qty 2
  Filled 2014-09-26: qty 1

## 2014-09-26 MED ORDER — PIPERACILLIN-TAZOBACTAM 3.375 G IVPB: 3.3750 g | Freq: Three times a day (TID) | INTRAVENOUS | Status: DC

## 2014-09-26 MED ORDER — LINACLOTIDE 145 MCG PO CAPS
145.0000 ug | ORAL_CAPSULE | Freq: Every day | ORAL | Status: DC
  Administered 2014-09-27 – 2014-09-30 (×4): 145 ug via ORAL
  Filled 2014-09-26 (×5): qty 1

## 2014-09-26 MED ORDER — ONDANSETRON HCL 4 MG PO TABS: 4.0000 mg | ORAL_TABLET | Freq: Four times a day (QID) | ORAL | Status: DC | PRN

## 2014-09-26 MED ORDER — GUAIFENESIN-DM 100-10 MG/5ML PO SYRP: 5.0000 mL | ORAL_SOLUTION | ORAL | Status: DC | PRN

## 2014-09-26 MED ORDER — INSULIN ASPART 100 UNIT/ML ~~LOC~~ SOLN
0.0000 [IU] | Freq: Every day | SUBCUTANEOUS | Status: DC
Start: 2014-09-26 — End: 2014-09-30
  Administered 2014-09-26 – 2014-09-27 (×2): 2 [IU] via SUBCUTANEOUS
  Administered 2014-09-28: 3 [IU] via SUBCUTANEOUS
  Administered 2014-09-29: 4 [IU] via SUBCUTANEOUS

## 2014-09-26 MED ORDER — CLOPIDOGREL BISULFATE 75 MG PO TABS
75.0000 mg | ORAL_TABLET | Freq: Every day | ORAL | Status: DC
  Administered 2014-09-27 – 2014-09-30 (×4): 75 mg via ORAL
  Filled 2014-09-26 (×5): qty 1

## 2014-09-26 MED ORDER — ONDANSETRON 4 MG PO TBDP
4.0000 mg | ORAL_TABLET | Freq: Four times a day (QID) | ORAL | Status: DC | PRN
  Filled 2014-09-26: qty 1

## 2014-09-26 MED ORDER — ASPIRIN EC 81 MG PO TBEC
81.0000 mg | DELAYED_RELEASE_TABLET | Freq: Every day | ORAL | Status: DC
  Administered 2014-09-27 – 2014-09-30 (×4): 81 mg via ORAL
  Filled 2014-09-26 (×5): qty 1

## 2014-09-26 MED ORDER — CARVEDILOL 6.25 MG PO TABS
6.2500 mg | ORAL_TABLET | Freq: Every evening | ORAL | Status: DC
  Administered 2014-09-27 – 2014-09-29 (×3): 6.25 mg via ORAL
  Filled 2014-09-26 (×5): qty 1

## 2014-09-26 MED ORDER — EZETIMIBE 10 MG PO TABS
10.0000 mg | ORAL_TABLET | Freq: Every morning | ORAL | Status: DC
  Administered 2014-09-27 – 2014-09-30 (×4): 10 mg via ORAL
  Filled 2014-09-26 (×4): qty 1

## 2014-09-26 MED ORDER — INSULIN ASPART 100 UNIT/ML ~~LOC~~ SOLN
0.0000 [IU] | Freq: Three times a day (TID) | SUBCUTANEOUS | Status: DC
Start: 2014-09-27 — End: 2014-09-30
  Administered 2014-09-27: 5 [IU] via SUBCUTANEOUS
  Administered 2014-09-27: 2 [IU] via SUBCUTANEOUS
  Administered 2014-09-27: 3 [IU] via SUBCUTANEOUS
  Administered 2014-09-28: 5 [IU] via SUBCUTANEOUS
  Administered 2014-09-28: 3 [IU] via SUBCUTANEOUS
  Administered 2014-09-28 – 2014-09-29 (×2): 5 [IU] via SUBCUTANEOUS
  Administered 2014-09-29 (×2): 3 [IU] via SUBCUTANEOUS
  Administered 2014-09-30: 2 [IU] via SUBCUTANEOUS

## 2014-09-26 MED ORDER — PREGABALIN 50 MG PO CAPS: 100.0000 mg | ORAL_CAPSULE | Freq: Three times a day (TID) | ORAL | Status: DC

## 2014-09-26 MED ORDER — OXYCODONE-ACETAMINOPHEN 5-325 MG PO TABS
2.0000 | ORAL_TABLET | Freq: Once | ORAL | Status: AC
  Administered 2014-09-26: 2 via ORAL
  Filled 2014-09-26: qty 2

## 2014-09-26 MED ORDER — HEPARIN SODIUM (PORCINE) 5000 UNIT/ML IJ SOLN
5000.0000 [IU] | Freq: Three times a day (TID) | INTRAMUSCULAR | Status: DC
  Administered 2014-09-26 – 2014-09-30 (×12): 5000 [IU] via SUBCUTANEOUS
  Filled 2014-09-26 (×15): qty 1

## 2014-09-26 MED ORDER — TAMSULOSIN HCL 0.4 MG PO CAPS
0.4000 mg | ORAL_CAPSULE | Freq: Every day | ORAL | Status: DC
  Administered 2014-09-27 – 2014-09-30 (×4): 0.4 mg via ORAL
  Filled 2014-09-26 (×5): qty 1

## 2014-09-26 MED ORDER — CARVEDILOL 25 MG PO TABS
25.0000 mg | ORAL_TABLET | Freq: Every day | ORAL | Status: DC
  Administered 2014-09-27 – 2014-09-30 (×4): 25 mg via ORAL
  Filled 2014-09-26 (×5): qty 1

## 2014-09-26 MED ORDER — POLYETHYLENE GLYCOL 3350 17 G PO PACK: 17.0000 g | PACK | Freq: Every day | ORAL | Status: DC | PRN

## 2014-09-26 MED ORDER — ATORVASTATIN CALCIUM 40 MG PO TABS
40.0000 mg | ORAL_TABLET | Freq: Every day | ORAL | Status: DC
  Administered 2014-09-27 – 2014-09-30 (×4): 40 mg via ORAL
  Filled 2014-09-26 (×5): qty 1

## 2014-09-26 MED ORDER — OMEGA-3-ACID ETHYL ESTERS 1 G PO CAPS
2000.0000 mg | ORAL_CAPSULE | Freq: Two times a day (BID) | ORAL | Status: DC
  Administered 2014-09-27 – 2014-09-30 (×7): 2000 mg via ORAL
  Filled 2014-09-26 (×11): qty 2

## 2014-09-26 NOTE — H&P (Signed)
Patient Demographics  [name], is a 59 y.o. male  MRN: [id number]   DOB - 01-06-1956  Admit Date - 09/26/2014  Outpatient Primary MD for the patient is [name], MD   With History of -  Past Medical History  Diagnosis Date  . Carotid artery occlusion 11/10/10    LEFT CAROTID ENDARTERECTOMY  . Diabetes mellitus   . Hypertension   . Hyperlipidemia   . COPD (chronic obstructive pulmonary disease)   . DJD (degenerative joint disease)   . GERD (gastroesophageal reflux disease)   . Substernal chest pain   . 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 Slurred speech     AS PER WIFE IN D/C NOTE 11/10/10  . PAD (peripheral artery disease)     Distal aortogram June 2012. Atherectomy left popliteal artery July 2012.   . Barrett's esophagus   . Fatty liver   . Diverticulosis of colon (without mention of hemorrhage)   . Emphysema of lung   . Osteomyelitis   . Complication of anesthesia     BP WENT UP AT [facility] "  . Sleep apnea     has not used CPAP in e years - took self off  . H/O hiatal hernia   . Neuromuscular disorder     peripheral neuropathy  . Full dentures   . Wears glasses       Past Surgical History  Procedure Laterality Date  . Laminectomy      X 3 LUMBAR AND X 2 CERVICAL SPINE OPERATIONS  . Carotid endarterectomy  11/10/10  . Hernia repair      LEFT INGUINAL AND UMBILICAL REPAIRS  . Anterior fusion cervical spine  02/06/06    C4-5, C5-6, C6-7; SURGEON DR. MAX COHEN  . Penile prosthesis implant  08/14/05    INFRAPUBIC INSERTION OF INFLATABLE PENILE PROSTHESIS; SURGEON DR. Amalia Hailey  . Laparoscopic cholecystectomy w/ cholangiography  11/09/04    SURGEON DR. Luella Cook  . Total knee arthroplasty  07/2002    RIGHT KNEE ; SURGEON  DR. [name] ALSO HAD ARTHROSCOPIC RIGHT KNEE  IN  10/2001  . Shoulder arthroscopy    . Cholecystectomy    . Amputation  11/05/2011    Procedure: AMPUTATION RAY;  Surgeon: Wylene Simmer, MD;  Location: [place];  Service: Orthopedics;  Laterality: Right;  Amputation of Right 4&5th Toes  . Femoral artery stent      x6  . Amputation Left 11/26/2012    Procedure: AMPUTATION RAY;  Surgeon: Wylene Simmer, MD;  Location: [place];  Service: Orthopedics;  Laterality: Left;  fourth ray amputation  . Cardiac catheterization  10/31/04    2009  . Carpal tunnel release Right 10/21/2013    Procedure: RIGHT CARPAL TUNNEL RELEASE;  Surgeon: Wynonia Sours, MD;  Location: [place];  Service: Orthopedics;  Laterality: Right;  . Ulnar nerve transposition Right 10/21/2013  Procedure: RIGHT ELBOW  ULNAR NERVE DECOMPRESSION;  Surgeon: Wynonia Sours, MD;  Location: [place];  Service: Orthopedics;  Laterality: Right;  . Joint replacement Right 2001    Total  . Spine surgery    . Neck surgery    . Esophageal manometry Bilateral 07/19/2014    Procedure: ESOPHAGEAL MANOMETRY (EM);  Surgeon: Jerene Bears, MD;  Location: [facility];  Service: Gastroenterology;  Laterality: Bilateral;  . Lower extremity angiogram N/A 03/19/2012    Procedure: LOWER EXTREMITY ANGIOGRAM;  Surgeon: Burnell Blanks, MD;  Location: [facility] CATH [facility];  Service: Cardiovascular;  Laterality: N/A;  . Amputation Right 08/27/2014    Procedure: Transmetatarsal Amputation;  Surgeon: Newt Minion, MD;  Location: [place];  Service: Orthopedics;  Laterality: Right;    in for   Chief Complaint  Patient presents with  . Post-op Problem     HPI  [name]  is a 59 y.o. male, with history of PAD status post bilateral lower extremity stenting 2 years ago at [facility], left carotid endarterectomy in 2012, type 2 diabetes mellitus non-insulin-dependent, hypertension, dyslipidemia, COPD, GERD, Barrett's esophagus, who underwent right fourth and fifth toe amputation a few  years ago by [name], about one month ago he had an exposed bone with a small ulcer in the right foot and underwent transmetatarsal resection by [name], he went home and was doing well. He had sutures removed 2 weeks ago. Since yesterday he started experiencing pain at the postop site, he also noticed some drainage, came to the ER where he was diagnosed with dehiscence of his postop wound with some puslike drainage. Orthopedics was consulted and hospitalist was requested to admit the patient. Patient has no fever chills or systemic symptoms.    Review of Systems    In addition to the HPI above,   No Fever-positive chills chills, No Headache, No changes with Vision or hearing, No problems swallowing food or Liquids, No Chest pain, Cough or Shortness of Breath, No Abdominal pain, No Nausea or Vommitting, Bowel movements are regular, No Blood in stool or Urine, No dysuria, No new skin rashes or bruises, No new joints pains-aches,  No new weakness, tingling, numbness in any extremity, except right foot pain and ulcer drainage No recent weight gain or loss, No polyuria, polydypsia or polyphagia, No significant Mental Stressors.  A full 10 point Review of Systems was done, except as stated above, all other Review of Systems were negative.   Social History History  Substance Use Topics  . Smoking status: Former Smoker -- 0.50 packs/day for 35 years    Types: Cigarettes    Quit date: 10/31/2011  . Smokeless tobacco: Current User     Comment: VAPOR CIGARETTES  . Alcohol Use: No     Comment: ONCE IN A WHILE      Family History Family History  Problem Relation Age of Onset  . Heart disease Father     Before age 17-  CAD, BPG  . Diabetes Father     Amputation  . Cancer Father     PROSTATE  . Hyperlipidemia Father   . Hypertension Father   . Heart attack Father     Triple BPG  . Arthritis      GRANDMOTHER  . Hypertension      OTHER FAMILY MEMBERS  . Colon cancer Brother     . Cancer Brother     Colon  . Diabetes Brother   . Heart  disease Brother 15    A-Fib. Before age 5  . Hyperlipidemia Brother   . Hypertension Brother   . Cancer Sister     Breast      Prior to Admission medications   Medication Sig Start Date End Date Taking? Authorizing Provider  ALPRAZolam Duanne Moron) 0.5 MG tablet Take 0.25 mg by mouth 3 (three) times daily.     Historical Provider, MD  aspirin EC 81 MG tablet Take 81 mg by mouth daily.    Historical Provider, MD  atorvastatin (LIPITOR) 40 MG tablet Take 40 mg by mouth daily.    Historical Provider, MD  AZOR 5-20 MG per tablet Take 1 tablet by mouth daily.  07/29/14   Historical Provider, MD  B Complex-C (B-COMPLEX WITH VITAMIN C) tablet Take 1 tablet by mouth daily.    Historical Provider, MD  carvedilol (COREG) 25 MG tablet Take 25 mg by mouth every morning.     Historical Provider, MD  carvedilol (COREG) 6.25 MG tablet Take 6.25 mg by mouth every evening.     Historical Provider, MD  clopidogrel (PLAVIX) 75 MG tablet Take 75 mg by mouth daily. 1 tab daily 03/28/12   Historical Provider, MD  ezetimibe (ZETIA) 10 MG tablet Take 10 mg by mouth every morning.    Historical Provider, MD  fentaNYL (DURAGESIC - DOSED MCG/HR) 50 MCG/HR Place 1 patch onto the skin every 3 (three) days.    Historical Provider, MD  furosemide (LASIX) 80 MG tablet Take 80 mg by mouth daily.    Historical Provider, MD  glyBURIDE-metformin (GLUCOVANCE) 2.5-500 MG per tablet Take 2 tablets by mouth 2 (two) times daily.  03/05/12   Historical Provider, MD  HYDROmorphone (DILAUDID) 2 MG tablet Take 1 tablet (2 mg total) by mouth every 4 (four) hours as needed for pain. 11/27/12   Ripudeep Krystal Eaton, MD  insulin glargine (LANTUS) 100 UNIT/ML injection Inject 50 Units into the skin at bedtime.     Historical Provider, MD  insulin glulisine (APIDRA) 100 UNIT/ML injection Inject 10-20 Units into the skin 3 (three) times daily before meals. 10 breakfast 10 lunch and 20 supper     Historical Provider, MD  lansoprazole (PREVACID) 30 MG capsule Take 30 mg by mouth 2 (two) times daily.    Historical Provider, MD  Linaclotide Rolan Lipa) 145 MCG CAPS capsule Take 1 capsule (145 mcg total) by mouth daily. 08/09/14   Jerene Bears, MD  Multiple Vitamin (MULTIVITAMIN) tablet Take 1 tablet by mouth daily.    Historical Provider, MD  nitroGLYCERIN (NITROSTAT) 0.4 MG SL tablet Place 1 tablet (0.4 mg total) under the tongue every 5 (five) minutes as needed (as needed for esophageal spasm). 08/09/14   Jerene Bears, MD  omega-3 acid ethyl esters (LOVAZA) 1 G capsule Take 2,000 mg by mouth 2 (two) times daily.     Historical Provider, MD  ondansetron (ZOFRAN-ODT) 4 MG disintegrating tablet Take 4 mg by mouth 4 (four) times daily as needed for nausea.    Historical Provider, MD  oxyCODONE-acetaminophen (PERCOCET/ROXICET) 5-325 MG per tablet Take 1 tablet by mouth every 8 (eight) hours as needed. 08/28/14   Donne Hazel, MD  pregabalin (LYRICA) 100 MG capsule Take 100 mg by mouth 3 (three) times daily.    Historical Provider, MD  pregabalin (LYRICA) 75 MG capsule Take 75 mg by mouth 3 (three) times daily.     Historical Provider, MD  rOPINIRole (REQUIP) 2 MG tablet Take 2 mg by mouth  at bedtime.     Historical Provider, MD  Tamsulosin HCl (FLOMAX) 0.4 MG CAPS Take 0.4 mg by mouth daily.    Historical Provider, MD    Allergies  Allergen Reactions  . Ivp Dye [Iodinated Diagnostic Agents] Anaphylaxis    Breathing problems  . Adhesive [Tape] Rash    Physical Exam  Vitals  Blood pressure 136/54, pulse 54, temperature 97.8 F (36.6 C), resp. rate 16, height 6\' 2"  (1.88 m), weight 108.863 kg (240 lb), SpO2 94 %.   1. General middle-aged Caucasian male lying in bed in NAD,     2. Normal affect and insight, Not Suicidal or Homicidal, Awake Alert, Oriented X 3.  3. No F.N deficits, ALL C.Nerves Intact, Strength 5/5 all 4 extremities, Sensation intact all 4 extremities, Plantars down  going.  4. Ears and Eyes appear Normal, Conjunctivae clear, PERRLA. Moist Oral Mucosa.  5. Supple Neck, No JVD, No cervical lymphadenopathy appriciated, No Carotid Bruits.  6. Symmetrical Chest wall movement, Good air movement bilaterally, CTAB.  7. RRR, No Gallops, Rubs or Murmurs, No Parasternal Heave.  8. Positive Bowel Sounds, Abdomen Soft, No tenderness, No organomegaly appriciated,No rebound -guarding or rigidity.  9.  No Cyanosis, Normal Skin Turgor, No Skin Rash or Bruise.  10. Good muscle tone,  joints appear normal , no effusions, Normal ROM.  11. No Palpable Lymph Nodes in Neck or Axillae, right foot ulcer as below in the picture, has old right fourth and fifth toe amputation       Data Review  CBC  Recent Labs Lab 09/26/14 1405  WBC 5.0  HGB 10.6*  HCT 30.0*  PLT 167  MCV 82.0  MCH 29.0  MCHC 35.3  RDW 13.0  LYMPHSABS 0.9  MONOABS 0.7  EOSABS 0.1  BASOSABS 0.0   ------------------------------------------------------------------------------------------------------------------  Chemistries   Recent Labs Lab 09/26/14 1405  NA 136  K 3.9  CL 101  CO2 25  GLUCOSE 265*  BUN 21  CREATININE 1.28  CALCIUM 8.9  AST 35  ALT 32  ALKPHOS 119*  BILITOT 0.7   ------------------------------------------------------------------------------------------------------------------ estimated creatinine clearance is 80.6 mL/min (by C-G formula based on Cr of 1.28). ------------------------------------------------------------------------------------------------------------------ No results for input(s): TSH, T4TOTAL, T3FREE, THYROIDAB in the last 72 hours.  Invalid input(s): FREET3   Coagulation profile No results for input(s): INR, PROTIME in the last 168 hours. ------------------------------------------------------------------------------------------------------------------- No results for input(s): DDIMER in the last 72  hours. -------------------------------------------------------------------------------------------------------------------  Cardiac Enzymes No results for input(s): CKMB, TROPONINI, MYOGLOBIN in the last 168 hours.  Invalid input(s): CK ------------------------------------------------------------------------------------------------------------------ Invalid input(s): POCBNP   ---------------------------------------------------------------------------------------------------------------  Urinalysis    Component Value Date/Time   COLORURINE YELLOW 11/09/2010 [place] 11/09/2010 1454   LABSPEC 1.035* 11/09/2010 1454   PHURINE 6.0 11/09/2010 1454   GLUCOSEU >1000* 08/30/2010 2011   HGBUR NEGATIVE 11/09/2010 1454   BILIRUBINUR NEGATIVE 11/09/2010 1454   KETONESUR NEGATIVE 11/09/2010 1454   PROTEINUR 30* 11/09/2010 1454   UROBILINOGEN 0.2 11/09/2010 1454   NITRITE NEGATIVE 11/09/2010 1454   LEUKOCYTESUR NEGATIVE 11/09/2010 1454    ----------------------------------------------------------------------------------------------------------------  Imaging results:   Dg Foot Complete Right  09/26/2014   CLINICAL DATA:  Status post surgery for infection of the right foot 08/28/2015. Draining wound on the lateral aspect of the foot.  EXAM: RIGHT FOOT COMPLETE - 3+ VIEW  COMPARISON:  Plain films of the right foot 08/24/2014. MRI right foot 08/25/2014.  FINDINGS: Since the prior studies, the patient has undergone amputation at the level  of the bases of the fourth and fifth metatarsals. Bandaging is present over the wound. No soft tissue gas collection, radiopaque foreign body or bony destructive change is seen. Severe first MTP joint space narrowing and bulky osteophytosis or heterotopic calcification about the first MTP joint is noted. Soft tissue swelling about the foot appears worsened compared to the prior exams.  IMPRESSION: Negative for plain film evidence of osteomyelitis.   Marked soft tissue swelling about the foot.  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   Electronically Signed   By: Inge Rise M.D.   On: 09/26/2014 14:46     Baseline preoperative EKG and chest x-ray ordered    Assessment & Plan   1. Right foot postoperative wound infection with minimal dehiscence. Recently admitted, will do blood cultures, IV Vanco and meropenem as MRSA and Pseudomonas both possibility, consulted orthopedics, consulted wound care, foot care order set initiated, will make him nothing by mouth after midnight. Blood cultures ordered.   2. DM type II. Check A1 C continue Lantus at sliding scale. Old oral hypoglycemic agents.   3. PAD. Continue Plavix, aspirin and statin for secondary prevention.   4. Essential hypertension. Home dose Coreg will be continued along with home dose Lasix. Monitor BMP intake and output closely.   5. Dyslipidemia continue statin along with Zetia.   6. GERD/Barrett's esophagus. Continue PPI. Outpatient follow-up with Dr. Hilarie Fredrickson post discharge if needed.   7. Diabetic peripheral neuropathy. Lyrica, which will be continued.   8. BPH. Continue Flomax.      DVT Prophylaxis Heparin   AM Labs Ordered, also please review Full Orders  Family Communication: Admission, patients condition and plan of care including tests being ordered have been discussed with the patient and wife who indicate understanding and agree with the plan and Code Status.  Code Status Full  Likely DC to Home  Condition Fair  Time spent in minutes : 35    Lala Lund K M.D on 09/26/2014 at 6:15 PM  Between 7am to 7pm - Pager - [phone]  After 7pm go to [email or web address] - [facility] Office  [phone]

## 2014-09-26 NOTE — ED Notes (Addendum)
Pt reports surgery on 08/27/14 to have part of tissue and bone (osteomyelitis) removed in right foot.  Pt reports having stiches removed 3 weeks ago and earlier this week pt noticed a "hole" in foot with yellow drainage and odor.

## 2014-09-26 NOTE — ED Notes (Signed)
Pt reports having right foot surgery 4 weeks ago, now has drainage and foul odor from wound. No acute distress noted at triage.

## 2014-09-26 NOTE — ED Notes (Signed)
Admitting at bedside 

## 2014-09-26 NOTE — ED Provider Notes (Signed)
CSN: [id number]     Arrival date & time 09/26/14  1257 History   First MD Initiated Contact with Patient 09/26/14 1614     Chief Complaint  Patient presents with  . Post-op Problem     (Consider location/radiation/quality/duration/timing/severity/associated sxs/prior Treatment) HPI [name] is a(n) 59 y.o. male who presents to the emergency department with chief complaint of right foot pain. He has a past medical history of COPD, diabetes, hypertension. 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 This occurred and December 2015. He is being followed by [name]. Patient states that he was evaluated by his physician on Tuesday, 09/21/2013. Since that time he has had increased pain and swelling. The incision line opened and is now draining purulent appearing discharge. He denies fevers, chills, nausea, vomiting. He states that the wound appears much worse than it did on the 12th.  Past Medical History  Diagnosis Date  . Carotid artery occlusion 11/10/10    LEFT CAROTID ENDARTERECTOMY  . Diabetes mellitus   . Hypertension   . Hyperlipidemia   . COPD (chronic obstructive pulmonary disease)   . DJD (degenerative joint disease)   . GERD (gastroesophageal reflux disease)   . Substernal chest pain   . 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 Slurred speech     AS PER WIFE IN D/C NOTE 11/10/10  . PAD (peripheral artery disease)     Distal aortogram June 2012. Atherectomy left popliteal artery July 2012.   . Barrett's esophagus   . Fatty liver   . Diverticulosis of colon (without mention of hemorrhage)   . Emphysema of lung   . Osteomyelitis   . Complication of anesthesia     BP WENT UP AT [facility] "  . Sleep apnea     has not used CPAP in e years - took self off  . H/O hiatal hernia   . Neuromuscular disorder     peripheral neuropathy  . Full dentures   . Wears glasses    Past Surgical History  Procedure  Laterality Date  . Laminectomy      X 3 LUMBAR AND X 2 CERVICAL SPINE OPERATIONS  . Carotid endarterectomy  11/10/10  . Hernia repair      LEFT INGUINAL AND UMBILICAL REPAIRS  . Anterior fusion cervical spine  02/06/06    C4-5, C5-6, C6-7; SURGEON DR. MAX COHEN  . Penile prosthesis implant  08/14/05    INFRAPUBIC INSERTION OF INFLATABLE PENILE PROSTHESIS; SURGEON DR. Amalia Hailey  . Laparoscopic cholecystectomy w/ cholangiography  11/09/04    SURGEON DR. Luella Cook  . Total knee arthroplasty  07/2002    RIGHT KNEE ; SURGEON  DR. [name] ALSO HAD ARTHROSCOPIC RIGHT KNEE IN  10/2001  . Shoulder arthroscopy    . Cholecystectomy    . Amputation  11/05/2011    Procedure: AMPUTATION RAY;  Surgeon: Wylene Simmer, MD;  Location: [place];  Service: Orthopedics;  Laterality: Right;  Amputation of Right 4&5th Toes  . Femoral artery stent      x6  . Amputation Left 11/26/2012    Procedure: AMPUTATION RAY;  Surgeon: Wylene Simmer, MD;  Location: [place];  Service: Orthopedics;  Laterality: Left;  fourth ray amputation  . Cardiac catheterization  10/31/04    2009  . Carpal tunnel release Right 10/21/2013    Procedure: RIGHT CARPAL TUNNEL RELEASE;  Surgeon: Wynonia Sours, MD;  Location: [place];  Service: Orthopedics;  Laterality: Right;  . Ulnar nerve transposition Right 10/21/2013    Procedure: RIGHT ELBOW  ULNAR NERVE DECOMPRESSION;  Surgeon: Wynonia Sours, MD;  Location: [place];  Service: Orthopedics;  Laterality: Right;  . Joint replacement Right 2001    Total  . Spine surgery    . Neck surgery    . Esophageal manometry Bilateral 07/19/2014    Procedure: ESOPHAGEAL MANOMETRY (EM);  Surgeon: Jerene Bears, MD;  Location: [facility];  Service: Gastroenterology;  Laterality: Bilateral;  . Lower extremity angiogram N/A 03/19/2012    Procedure: LOWER EXTREMITY ANGIOGRAM;  Surgeon: Burnell Blanks, MD;  Location: [facility];  Service: Cardiovascular;  Laterality: N/A;  .  Amputation Right 08/27/2014    Procedure: Transmetatarsal Amputation;  Surgeon: Newt Minion, MD;  Location: [place];  Service: Orthopedics;  Laterality: Right;   Family History  Problem Relation Age of Onset  . Heart disease Father     Before age 44-  CAD, BPG  . Diabetes Father     Amputation  . Cancer Father     PROSTATE  . Hyperlipidemia Father   . Hypertension Father   . Heart attack Father     Triple BPG  . Arthritis      GRANDMOTHER  . Hypertension      OTHER FAMILY MEMBERS  . Colon cancer Brother   . Cancer Brother     Colon  . Diabetes Brother   . Heart disease Brother 35    A-Fib. Before age 44  . Hyperlipidemia Brother   . Hypertension Brother   . Cancer Sister     Breast   History  Substance Use Topics  . Smoking status: Former Smoker -- 0.50 packs/day for 35 years    Types: Cigarettes    Quit date: 10/31/2011  . Smokeless tobacco: Current User     Comment: VAPOR CIGARETTES  . Alcohol Use: No     Comment: ONCE IN A WHILE    Review of Systems  Ten systems reviewed and are negative for acute change, except as noted in the HPI.    Allergies  Ivp dye and Adhesive  Home Medications   Prior to Admission medications   Medication Sig Start Date End Date Taking? Authorizing Provider  ALPRAZolam Duanne Moron) 0.5 MG tablet Take 0.25 mg by mouth 3 (three) times daily.     Historical Provider, MD  aspirin EC 81 MG tablet Take 81 mg by mouth daily.    Historical Provider, MD  atorvastatin (LIPITOR) 40 MG tablet Take 40 mg by mouth daily.    Historical Provider, MD  AZOR 5-20 MG per tablet Take 1 tablet by mouth daily.  07/29/14   Historical Provider, MD  B Complex-C (B-COMPLEX WITH VITAMIN C) tablet Take 1 tablet by mouth daily.    Historical Provider, MD  carvedilol (COREG) 25 MG tablet Take 25 mg by mouth every morning.     Historical Provider, MD  carvedilol (COREG) 6.25 MG tablet Take 6.25 mg by mouth every evening.     Historical Provider, MD  clopidogrel  (PLAVIX) 75 MG tablet Take 75 mg by mouth daily. 1 tab daily 03/28/12   Historical Provider, MD  ezetimibe (ZETIA) 10 MG tablet Take 10 mg by mouth every morning.    Historical Provider, MD  fentaNYL (DURAGESIC - DOSED MCG/HR) 50 MCG/HR Place 1 patch onto the skin every 3 (three) days.    Historical  Provider, MD  furosemide (LASIX) 80 MG tablet Take 80 mg by mouth daily.    Historical Provider, MD  glyBURIDE-metformin (GLUCOVANCE) 2.5-500 MG per tablet Take 2 tablets by mouth 2 (two) times daily.  03/05/12   Historical Provider, MD  HYDROmorphone (DILAUDID) 2 MG tablet Take 1 tablet (2 mg total) by mouth every 4 (four) hours as needed for pain. 11/27/12   Ripudeep Krystal Eaton, MD  insulin glargine (LANTUS) 100 UNIT/ML injection Inject 50 Units into the skin at bedtime.     Historical Provider, MD  insulin glulisine (APIDRA) 100 UNIT/ML injection Inject 10-20 Units into the skin 3 (three) times daily before meals. 10 breakfast 10 lunch and 20 supper    Historical Provider, MD  lansoprazole (PREVACID) 30 MG capsule Take 30 mg by mouth 2 (two) times daily.    Historical Provider, MD  Linaclotide Rolan Lipa) 145 MCG CAPS capsule Take 1 capsule (145 mcg total) by mouth daily. 08/09/14   Jerene Bears, MD  Multiple Vitamin (MULTIVITAMIN) tablet Take 1 tablet by mouth daily.    Historical Provider, MD  nitroGLYCERIN (NITROSTAT) 0.4 MG SL tablet Place 1 tablet (0.4 mg total) under the tongue every 5 (five) minutes as needed (as needed for esophageal spasm). 08/09/14   Jerene Bears, MD  omega-3 acid ethyl esters (LOVAZA) 1 G capsule Take 2,000 mg by mouth 2 (two) times daily.     Historical Provider, MD  ondansetron (ZOFRAN-ODT) 4 MG disintegrating tablet Take 4 mg by mouth 4 (four) times daily as needed for nausea.    Historical Provider, MD  oxyCODONE-acetaminophen (PERCOCET/ROXICET) 5-325 MG per tablet Take 1 tablet by mouth every 8 (eight) hours as needed. 08/28/14   Donne Hazel, MD  pregabalin (LYRICA) 100 MG  capsule Take 100 mg by mouth 3 (three) times daily.    Historical Provider, MD  pregabalin (LYRICA) 75 MG capsule Take 75 mg by mouth 3 (three) times daily.     Historical Provider, MD  rOPINIRole (REQUIP) 2 MG tablet Take 2 mg by mouth at bedtime.     Historical Provider, MD  Tamsulosin HCl (FLOMAX) 0.4 MG CAPS Take 0.4 mg by mouth daily.    Historical Provider, MD   BP 135/62 mmHg  Pulse 59  Temp(Src) 97.8 F (36.6 C)  Resp 16  Ht 6\' 2"  (1.88 m)  Wt 240 lb (108.863 kg)  BMI 30.80 kg/m2  SpO2 98% Physical Exam  Constitutional: He appears well-developed and well-nourished. No distress.  HENT:  Head: Normocephalic and atraumatic.  Eyes: Conjunctivae are normal. No scleral icterus.  Neck: Normal range of motion. Neck supple.  Cardiovascular: Normal rate, regular rhythm and normal heart sounds.   Pulmonary/Chest: Effort normal and breath sounds normal. No respiratory distress.  Abdominal: Soft. There is no tenderness.  Musculoskeletal: He exhibits no edema.  Neurological: He is alert.  Skin: Skin is warm and dry. He is not diaphoretic.  Psychiatric: His behavior is normal.  Nursing note and vitals reviewed.  Right foot is warm. Several areas of wound dehiscence with purulent drainage noted.      ED Course  Procedures (including critical care time) Labs Review Labs Reviewed  CBC WITH DIFFERENTIAL - Abnormal; Notable for the following:    RBC 3.66 (*)    Hemoglobin 10.6 (*)    HCT 30.0 (*)    Monocytes Relative 13 (*)    All other components within normal limits  COMPREHENSIVE METABOLIC PANEL - Abnormal; Notable for the following:  Glucose, Bld 265 (*)    Alkaline Phosphatase 119 (*)    GFR calc non Af Amer 59 (*)    GFR calc Af Amer 69 (*)    All other components within normal limits    Imaging Review Dg Foot Complete Right  09/26/2014   CLINICAL DATA:  Status post surgery for infection of the right foot 08/28/2015. Draining wound on the lateral aspect of the  foot.  EXAM: RIGHT FOOT COMPLETE - 3+ VIEW  COMPARISON:  Plain films of the right foot 08/24/2014. MRI right foot 08/25/2014.  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 Bandaging is present over the wound. No soft tissue gas collection, radiopaque foreign body or bony destructive change is seen. Severe first MTP joint space narrowing and bulky osteophytosis or heterotopic calcification about the first MTP joint is noted. Soft tissue swelling about the foot appears worsened compared to the prior exams.  IMPRESSION: Negative for plain film evidence of osteomyelitis.  Marked soft tissue swelling about the foot.  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   Electronically Signed   By: Inge Rise M.D.   On: 09/26/2014 14:46     EKG Interpretation None      MDM   Final diagnoses:  Post op infection    4:53 PM Patient with purulent drainage and odor from surgical incision. Status post ray amputation due to osteomyelitis on 08/27/2014. I taking lisinopril for wound culture. I spoken with Dr. Erlinda Hong , who is on call for [facility]. The patient will be started on vancomycin. Pain control initiated. He will be seen by [name] in the morning. Patient and his wife agree with plan of care. He appears safe for admission.  Are reviewed and show elevated glucose, elevated alkaline phosphatase. Hemoglobin is slightly low at 10.6. Blood cultures drawn.       Margarita Mail, PA-C 09/27/14 [place], MD 09/29/14 1718

## 2014-09-26 NOTE — Progress Notes (Addendum)
ANTIBIOTIC CONSULT NOTE - INITIAL  Pharmacy Consult for zosyn, vancomycin Indication: wound infection  Allergies  Allergen Reactions  . Ivp Dye [Iodinated Diagnostic Agents] Anaphylaxis    Breathing problems  . Adhesive [Tape] Rash    Patient Measurements: Height: 6\' 2"  (188 cm) Weight: 240 lb (108.863 kg) IBW/kg (Calculated) : 82.2 Adjusted Body Weight:   Vital Signs: Temp: 97.8 F (36.6 C) (01/17 1301) BP: 130/50 mmHg (01/17 1700) Pulse Rate: 55 (01/17 1700) Intake/Output from previous day:   Intake/Output from this shift:    Labs:  Recent Labs  09/26/14 1405  WBC 5.0  HGB 10.6*  PLT 167  CREATININE 1.28   Estimated Creatinine Clearance: 80.6 mL/min (by C-G formula based on Cr of 1.28). No results for input(s): VANCOTROUGH, VANCOPEAK, VANCORANDOM, GENTTROUGH, GENTPEAK, GENTRANDOM, TOBRATROUGH, TOBRAPEAK, TOBRARND, AMIKACINPEAK, AMIKACINTROU, AMIKACIN in the last 72 hours.   Microbiology: No results found for this or any previous visit (from the past 720 hour(s)).  Medical History: Past Medical History  Diagnosis Date  . Carotid artery occlusion 11/10/10    LEFT CAROTID ENDARTERECTOMY  . Diabetes mellitus   . Hypertension   . Hyperlipidemia   . COPD (chronic obstructive pulmonary disease)   . DJD (degenerative joint disease)   . GERD (gastroesophageal reflux disease)   . Substernal chest pain   . 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 Slurred speech     AS PER WIFE IN D/C NOTE 11/10/10  . PAD (peripheral artery disease)     Distal aortogram June 2012. Atherectomy left popliteal artery July 2012.   . Barrett's esophagus   . Fatty liver   . Diverticulosis of colon (without mention of hemorrhage)   . Emphysema of lung   . Osteomyelitis   . Complication of anesthesia     BP WENT UP AT [facility] "  . Sleep apnea     has not used CPAP in e years - took self off  . H/O hiatal hernia   . Neuromuscular disorder     peripheral neuropathy   . Full dentures   . Wears glasses     Medications:  See EMR  Assessment: 59 yo F with hx diabetic foot ulcer, osteomyelitis with toe amputations.  Had surgery in December 2015 to R foot, now pt noticing hole in foot with drainage. No leukocytosis, afeb, Scr 1.2, eCrCl > 80 ml/min  Goal of Therapy:  Vancomycin trough level 15-20 mcg/ml  Plan:  -Zosyn 3.375 g IV q8h -Vancomycin 2g IV x1 then 750/12h -Monitor renal fx, cultures, VT at steady state.   Hughes Better, PharmD, BCPS Clinical Pharmacist Pager: [phone] 09/26/2014 6:08 PM    ==================================   Addendum: - change from Zosyn to [place]: - [place] - Monitor renal fxn, clinical course    Miguel Christiana D. Mina Marble, PharmD, BCPS Pager:  [phone] 09/26/2014, 6:31 PM

## 2014-09-27 DIAGNOSIS — L03116 Cellulitis of left lower limb: Secondary | ICD-10-CM

## 2014-09-27 DIAGNOSIS — L97529 Non-pressure chronic ulcer of other part of left foot with unspecified severity: Secondary | ICD-10-CM

## 2014-09-27 LAB — TROPONIN I
Troponin I: <0.03 ng/mL (ref <0.031)
Troponin I: <0.03 ng/mL (ref <0.031)
Troponin I: <0.03 ng/mL (ref <0.031)

## 2014-09-27 LAB — BASIC METABOLIC PANEL
Anion gap: 5 (ref 5–15)
BUN: 21 mg/dL (ref 6–23)
CO2: 31 mmol/L (ref 19–32)
Calcium: 9.1 mg/dL (ref 8.4–10.5)
Chloride: 101 mEq/L (ref 96–112)
Creatinine, Ser: 1.26 mg/dL (ref 0.50–1.35)
GFR calc Af Amer: 70 mL/min — ABNORMAL LOW (ref >90)
GFR calc non Af Amer: 60 mL/min — ABNORMAL LOW (ref >90)
Glucose, Bld: 195 mg/dL — ABNORMAL HIGH (ref 70–99)
Potassium: 4.1 mmol/L (ref 3.5–5.1)
Sodium: 137 mmol/L (ref 135–145)

## 2014-09-27 LAB — C-REACTIVE PROTEIN: CRP: 1 mg/dL — ABNORMAL HIGH (ref <0.60)

## 2014-09-27 LAB — GLUCOSE, CAPILLARY
Glucose-Capillary: 135 mg/dL — ABNORMAL HIGH (ref 70–99)
Glucose-Capillary: 176 mg/dL — ABNORMAL HIGH (ref 70–99)
Glucose-Capillary: 205 mg/dL — ABNORMAL HIGH (ref 70–99)
Glucose-Capillary: 237 mg/dL — ABNORMAL HIGH (ref 70–99)

## 2014-09-27 LAB — CBC
HCT: 30.7 % — ABNORMAL LOW (ref 39.0–52.0)
Hemoglobin: 10.8 g/dL — ABNORMAL LOW (ref 13.0–17.0)
MCH: 29.2 pg (ref 26.0–34.0)
MCHC: 35.2 g/dL (ref 30.0–36.0)
MCV: 83 fL (ref 78.0–100.0)
Platelets: 169 10*3/uL (ref 150–400)
RBC: 3.7 MIL/uL — ABNORMAL LOW (ref 4.22–5.81)
RDW: 13.4 % (ref 11.5–15.5)
WBC: 4.1 10*3/uL (ref 4.0–10.5)

## 2014-09-27 LAB — HEMOGLOBIN A1C
Hgb A1c MFr Bld: 6.5 % — ABNORMAL HIGH (ref <5.7)
Mean Plasma Glucose: 140 mg/dL — ABNORMAL HIGH (ref <117)

## 2014-09-27 MED ORDER — HYDRALAZINE HCL 20 MG/ML IJ SOLN: 10.0000 mg | INTRAMUSCULAR | Status: DC | PRN

## 2014-09-27 MED ORDER — NITROGLYCERIN 2 % TD OINT
0.5000 [in_us] | TOPICAL_OINTMENT | Freq: Four times a day (QID) | TRANSDERMAL | Status: DC
  Administered 2014-09-27 – 2014-09-30 (×13): 0.5 [in_us] via TOPICAL
  Filled 2014-09-27: qty 30

## 2014-09-27 MED ORDER — POLYETHYLENE GLYCOL 3350 17 G PO PACK
17.0000 g | PACK | Freq: Once | ORAL | Status: AC
  Administered 2014-09-27: 17 g via ORAL
  Filled 2014-09-27: qty 1

## 2014-09-27 MED ORDER — DEXTROSE 5 % IV SOLN
1.0000 g | Freq: Three times a day (TID) | INTRAVENOUS | Status: DC
  Administered 2014-09-27 – 2014-09-29 (×6): 1 g via INTRAVENOUS
  Filled 2014-09-27 (×8): qty 1

## 2014-09-27 MED ORDER — GLUCERNA SHAKE PO LIQD
237.0000 mL | Freq: Two times a day (BID) | ORAL | Status: DC
  Administered 2014-09-27 – 2014-09-30 (×7): 237 mL via ORAL

## 2014-09-27 MED ORDER — PREGABALIN 25 MG PO CAPS
25.0000 mg | ORAL_CAPSULE | Freq: Once | ORAL | Status: AC
  Administered 2014-09-27: 25 mg via ORAL
  Filled 2014-09-27: qty 1

## 2014-09-27 MED ORDER — PREGABALIN 50 MG PO CAPS
100.0000 mg | ORAL_CAPSULE | Freq: Three times a day (TID) | ORAL | Status: DC
  Administered 2014-09-28 – 2014-09-30 (×7): 100 mg via ORAL
  Filled 2014-09-27 (×7): qty 2

## 2014-09-27 MED ORDER — METRONIDAZOLE 500 MG PO TABS
500.0000 mg | ORAL_TABLET | Freq: Three times a day (TID) | ORAL | Status: DC
  Administered 2014-09-27 – 2014-09-29 (×6): 500 mg via ORAL
  Filled 2014-09-27 (×10): qty 1

## 2014-09-27 MED ORDER — HYDRALAZINE HCL 25 MG PO TABS
25.0000 mg | ORAL_TABLET | Freq: Three times a day (TID) | ORAL | Status: DC
  Administered 2014-09-27 – 2014-09-30 (×8): 25 mg via ORAL
  Filled 2014-09-27 (×12): qty 1

## 2014-09-27 MED ORDER — VANCOMYCIN HCL IN DEXTROSE 1-5 GM/200ML-% IV SOLN
1000.0000 mg | Freq: Two times a day (BID) | INTRAVENOUS | Status: DC
  Administered 2014-09-27 – 2014-09-29 (×5): 1000 mg via INTRAVENOUS
  Filled 2014-09-27 (×6): qty 200

## 2014-09-27 NOTE — Progress Notes (Signed)
ANTIBIOTIC CONSULT NOTE - INITIAL  Pharmacy Consult for Vancomycin, Merrem>>Cefepime  Indication: wound infection  Allergies  Allergen Reactions  . Ivp Dye [Iodinated Diagnostic Agents] Anaphylaxis    Breathing problems  . Adhesive [Tape] Rash    Patient Measurements: Height: 6\' 2"  (188 cm) Weight: 249 lb 11.2 oz (113.263 kg) IBW/kg (Calculated) : 82.2 Adjusted Body Weight:   Vital Signs: Temp: 98.3 F (36.8 C) (01/18 0549) Temp Source: Oral (01/18 0549) BP: 142/56 mmHg (01/18 0549) Pulse Rate: 55 (01/18 0549) Intake/Output from previous day: 01/17 0701 - 01/18 0700 In: 360 [P.O.:360] Out: 700 [Urine:700] Intake/Output from this shift:    Labs:  Recent Labs  09/26/14 1405 09/27/14 0430  WBC 5.0 4.1  HGB 10.6* 10.8*  PLT 167 169  CREATININE 1.28 1.26   Estimated Creatinine Clearance: 83.4 mL/min (by C-G formula based on Cr of 1.26). No results for input(s): VANCOTROUGH, VANCOPEAK, VANCORANDOM, GENTTROUGH, GENTPEAK, GENTRANDOM, TOBRATROUGH, TOBRAPEAK, TOBRARND, AMIKACINPEAK, AMIKACINTROU, AMIKACIN in the last 72 hours.   Microbiology: Recent Results (from the past 720 hour(s))  Wound culture     Status: None (Preliminary result)   Collection Time: 09/26/14  4:52 PM  Result Value Ref Range Status   Specimen Description FOOT  Final   Special Requests FOOT AMPUTATION  Final   Gram Stain PENDING  Incomplete   Culture   Final    Culture reincubated for better growth Performed at [facility]    Report Status PENDING  Incomplete    Medical History: Past Medical History  Diagnosis Date  . Carotid artery occlusion 11/10/10    LEFT CAROTID ENDARTERECTOMY  . Diabetes mellitus   . Hypertension   . Hyperlipidemia   . COPD (chronic obstructive pulmonary disease)   . DJD (degenerative joint disease)   . GERD (gastroesophageal reflux disease)   . Substernal chest pain   . 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 Slurred speech    AS PER WIFE IN D/C NOTE 11/10/10  . PAD (peripheral artery disease)     Distal aortogram June 2012. Atherectomy left popliteal artery July 2012.   . Barrett's esophagus   . Fatty liver   . Diverticulosis of colon (without mention of hemorrhage)   . Emphysema of lung   . Osteomyelitis   . Complication of anesthesia     BP WENT UP AT [facility] "  . Sleep apnea     has not used CPAP in e years - took self off  . H/O hiatal hernia   . Neuromuscular disorder     peripheral neuropathy  . Full dentures   . Wears glasses     Medications:  See EMR  Assessment: 59 yo F with hx diabetic foot ulcer, osteomyelitis with toe amputations.  Had surgery in December 2015 to R foot, now pt noticing hole in foot with drainage. Currently on Vancomycin and Merrem for empiric therapy. Pharmacy consulted to stop Merrem and switch to Cefepime for r/o diabetic foot ulcer. No leukocytosis, afeb, Scr stable at 1.26, eCrCl > 80 ml/min'  Vanc 1/17 >> Zosyn 1/17 >> 1/17 Merrem 1/17 >>1/18 Cefepime 1/18>>   1/17 Blood Cx x2>>  1/17 Foot wound >>    Goal of Therapy:  Vancomycin trough level 15-20 mcg/ml  Plan:  -Vancomycin 1 gm IV Q 12 hours -Cefepime 1 gm IV Q 8 hours -Monitor renal fx, cultures, VT at steady state.  Albertina Parr, PharmD., BCPS Clinical Pharmacist Pager [phone]

## 2014-09-27 NOTE — Progress Notes (Signed)
Patient Demographics  [name], is a 59 y.o. male, DOB - 01-28-1956, [id number]  Admit date - 09/26/2014   Admitting Physician Thurnell Lose, MD  Outpatient Primary MD for the patient is [name], MD  LOS - 1   Chief Complaint  Patient presents with  . Post-op Problem        Subjective:   [name] today has, No headache, Mild chest pressure, No abdominal pain - No Nausea, No new weakness tingling or numbness, No Cough - SOB.    Assessment & Plan    1. Right foot postoperative wound infection with minimal dehiscence. Recently admitted, empiric antibiotics addressing MRSA and possible pseudomonas on board, discussed with [name], trial of antibiotics for a few days and monitor wound closely.   2. DM type II. Check A1 C continue Lantus at sliding scale. Old oral hypoglycemic agents.  Lab Results  Component Value Date   HGBA1C 6.5* 09/26/2014    CBG (last 3)   Recent Labs  09/26/14 1846 09/26/14 2142 09/27/14 0728  GLUCAP 193* 236* 135*      3. PAD. Continue Plavix, aspirin and statin for secondary prevention.   4. Essential hypertension. Home dose Coreg will be continued along with home dose Lasix. Monitor BMP intake and output closely.   5. Dyslipidemia continue statin along with Zetia.   6. GERD/Barrett's esophagus. Continue PPI. Outpatient follow-up with Dr. Hilarie Fredrickson post discharge if needed.   7. Diabetic peripheral neuropathy. Lyrica, which will be continued.   8. BPH. Continue Flomax.   9. Chest pressure. Some lateral T-wave inversions on EKG. Cycle troponin, Nitropaste, echogram to evaluate wall motion and EF, requested [name] his cardiologist to evaluate the patient. Continue on aspirin, Plavix, beta blocker and statin for secondary  prevention. Does have history of severe PAD.     Code Status: Full  Family Communication: wife  Disposition Plan: Home   Procedures   Echogram.   Consults  [name] orthopedics, [name] cardiology   Medications  Scheduled Meds: . ALPRAZolam  0.25 mg Oral TID  . aspirin EC  81 mg Oral Daily  . atorvastatin  40 mg Oral Daily  . B-complex with vitamin C  1 tablet Oral Daily  . carvedilol  25 mg Oral Q breakfast  . carvedilol  6.25 mg Oral QPM  . ceFEPime (MAXIPIME) IV  1 g Intravenous Q8H  . clopidogrel  75 mg Oral Daily  . ezetimibe  10 mg Oral q morning - 10a  . fentaNYL  50 mcg Transdermal Q72H  . furosemide  80 mg Oral Daily  . heparin  5,000 Units Subcutaneous 3 times per day  . insulin aspart  0-15 Units Subcutaneous TID WC  . insulin aspart  0-5 Units Subcutaneous QHS  . insulin glargine  50 Units Subcutaneous QHS  . Linaclotide  145 mcg Oral Daily  . metroNIDAZOLE  500 mg Oral 3 times per day  . nitroGLYCERIN  0.5 inch Topical 4 times per day  . omega-3 acid ethyl esters  2,000 mg Oral BID  . pantoprazole  20 mg Oral Daily  . pregabalin  75 mg Oral TID  . rOPINIRole  2 mg Oral QHS  . tamsulosin  0.4 mg  Oral Daily  . vancomycin  1,000 mg Intravenous Q12H   Continuous Infusions:  PRN Meds:.guaiFENesin-dextromethorphan, HYDROcodone-acetaminophen, HYDROmorphone (DILAUDID) injection, nitroGLYCERIN, ondansetron **OR** ondansetron (ZOFRAN) IV, ondansetron, polyethylene glycol  DVT Prophylaxis   Heparin    Lab Results  Component Value Date   PLT 169 09/27/2014    Antibiotics     Anti-infectives    Start     Dose/Rate Route Frequency Ordered Stop   09/27/14 1400  ceFEPIme (MAXIPIME) 1 g in dextrose 5 % 50 mL IVPB     1 g100 mL/hr over 30 Minutes Intravenous Every 8 hours 09/27/14 0918     09/27/14 1000  metroNIDAZOLE (FLAGYL) tablet 500 mg     500 mg Oral 3 times per day 09/27/14 0911     09/27/14 1000  vancomycin (VANCOCIN) IVPB 1000 mg/200 mL premix      1,000 mg200 mL/hr over 60 Minutes Intravenous Every 12 hours 09/27/14 0918     09/26/14 1900  meropenem (MERREM) 1 g in sodium chloride 0.9 % 100 mL IVPB  Status:  Discontinued     1 g200 mL/hr over 30 Minutes Intravenous 3 times per day 09/26/14 1832 09/27/14 0918   09/26/14 1830  piperacillin-tazobactam (ZOSYN) IVPB 3.375 g  Status:  Discontinued     3.375 g12.5 mL/hr over 240 Minutes Intravenous 3 times per day 09/26/14 1806 09/26/14 1831   09/26/14 1830  vancomycin (VANCOCIN) 2,000 mg in sodium chloride 0.9 % 500 mL IVPB     2,000 mg250 mL/hr over 120 Minutes Intravenous  Once 09/26/14 1807 09/26/14 2018          Objective:   Filed Vitals:   09/26/14 1745 09/26/14 1853 09/26/14 2143 09/27/14 0549  BP: 136/54 164/77 143/66 142/56  Pulse: 54 55 55 55  Temp:  97.7 F (36.5 C) 97.7 F (36.5 C) 98.3 F (36.8 C)  TempSrc:  Oral Oral Oral  Resp:  18 18 17   Height:  6\' 2"  (1.88 m)    Weight:  112.492 kg (248 lb)  113.263 kg (249 lb 11.2 oz)  SpO2: 94% 98% 98% 97%    Wt Readings from Last 3 Encounters:  09/27/14 113.263 kg (249 lb 11.2 oz)  08/24/14 113.399 kg (250 lb)  08/17/14 113.399 kg (250 lb)     Intake/Output Summary (Last 24 hours) at 09/27/14 1050 Last data filed at 09/27/14 0551  Gross per 24 hour  Intake    360 ml  Output    700 ml  Net   -340 ml     Physical Exam  Awake Alert, Oriented X 3, No new F.N deficits, Normal affect [place].AT,PERRAL Supple Neck,No JVD, No cervical lymphadenopathy appriciated.  Symmetrical Chest wall movement, Good air movement bilaterally, CTAB RRR,No Gallops,Rubs or new Murmurs, No Parasternal Heave +ve B.Sounds, Abd Soft, No tenderness, No organomegaly appriciated, No rebound - guarding or rigidity. No Cyanosis, Clubbing or edema, No new Rash or bruise , R foot in bandage  Picture from admission day     Data Review   Micro Results Recent Results (from the past 240 hour(s))  Wound culture     Status: None (Preliminary  result)   Collection Time: 09/26/14  4:52 PM  Result Value Ref Range Status   Specimen Description FOOT  Final   Special Requests FOOT AMPUTATION  Final   Gram Stain PENDING  Incomplete   Culture   Final    Culture reincubated for better growth Performed at [facility]  Status PENDING  Incomplete    Radiology Reports Dg Chest Port 1 View  09/26/2014   CLINICAL DATA:  Right foot infection. Amputation 4 weeks ago. Emphysema. Hypertension. Cough.  EXAM: PORTABLE CHEST - 1 VIEW  COMPARISON:  11/25/2012  FINDINGS: Gas beneath the left hemidiaphragm is presumably gastric although not entirely specific on today's semi erect chest view.  Mild atelectasis along the left hemidiaphragm. Low lung volumes. Thoracic spondylosis. Lower cervical plate and screw fixator.  Mild central airway thickening.  IMPRESSION: 1. Airway thickening is present, suggesting bronchitis or reactive airways disease. 2. Mild atelectasis along the left hemidiaphragm. 3. Thoracic spondylosis.   Electronically Signed   By: Sherryl Barters M.D.   On: 09/26/2014 20:34   Dg Foot Complete Right  09/26/2014   CLINICAL DATA:  Status post surgery for infection of the right foot 08/28/2015. Draining wound on the lateral aspect of the foot.  EXAM: RIGHT FOOT COMPLETE - 3+ VIEW  COMPARISON:  Plain films of the right foot 08/24/2014. MRI right foot 08/25/2014.  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 Bandaging is present over the wound. No soft tissue gas collection, radiopaque foreign body or bony destructive change is seen. Severe first MTP joint space narrowing and bulky osteophytosis or heterotopic calcification about the first MTP joint is noted. Soft tissue swelling about the foot appears worsened compared to the prior exams.  IMPRESSION: Negative for plain film evidence of osteomyelitis.  Marked soft tissue swelling about the  foot.  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   Electronically Signed   By: Inge Rise M.D.   On: 09/26/2014 14:46     CBC  Recent Labs Lab 09/26/14 1405 09/27/14 0430  WBC 5.0 4.1  HGB 10.6* 10.8*  HCT 30.0* 30.7*  PLT 167 169  MCV 82.0 83.0  MCH 29.0 29.2  MCHC 35.3 35.2  RDW 13.0 13.4  LYMPHSABS 0.9  --   MONOABS 0.7  --   EOSABS 0.1  --   BASOSABS 0.0  --     Chemistries   Recent Labs Lab 09/26/14 1405 09/27/14 0430  NA 136 137  K 3.9 4.1  CL 101 101  CO2 25 31  GLUCOSE 265* 195*  BUN 21 21  CREATININE 1.28 1.26  CALCIUM 8.9 9.1  AST 35  --   ALT 32  --   ALKPHOS 119*  --   BILITOT 0.7  --    ------------------------------------------------------------------------------------------------------------------ estimated creatinine clearance is 83.4 mL/min (by C-G formula based on Cr of 1.26). ------------------------------------------------------------------------------------------------------------------  Recent Labs  09/26/14 1920  HGBA1C 6.5*   ------------------------------------------------------------------------------------------------------------------ No results for input(s): CHOL, HDL, LDLCALC, TRIG, CHOLHDL, LDLDIRECT in the last 72 hours. ------------------------------------------------------------------------------------------------------------------ No results for input(s): TSH, T4TOTAL, T3FREE, THYROIDAB in the last 72 hours.  Invalid input(s): FREET3 ------------------------------------------------------------------------------------------------------------------ No results for input(s): VITAMINB12, FOLATE, FERRITIN, TIBC, IRON, RETICCTPCT in the last 72 hours.  Coagulation profile No results for input(s): INR, PROTIME in the last 168 hours.  No results for input(s): DDIMER in the last 72 hours.  Cardiac Enzymes  Recent Labs Lab 09/27/14 0839    TROPONINI <0.03   ------------------------------------------------------------------------------------------------------------------ Invalid input(s): POCBNP     Time Spent in minutes  35   Tecla Mailloux K M.D on 09/27/2014 at 10:50 AM  Between 7am to 7pm - Pager - [phone]  After 7pm  go to [email or web address] - [facility] Office  [phone]

## 2014-09-27 NOTE — Consult Note (Signed)
WOC consulted however noted that [name] has evaluated this patient this am.  I have contacted [name] and received a verbal order for dry dressings only.  Orders entered into the chart.   Discussed POC with bedside nurse.  Re consult if needed, will not follow at this time. Thanks  Casanova Schurman [place], [place] [phone])

## 2014-09-27 NOTE — Consult Note (Addendum)
CARDIOLOGY CONSULT NOTE  Patient ID: [name] MRN: [id number] DOB/AGE: 1956-05-29 59 y.o.  Admit date: 09/26/2014 Referring Physician  Harold Lund, MD Primary Physician:  Harold Bolt, MD Reason for Consultation  Chest pain and dyspnea  HPI:  [name] is a 59 year old Caucasian male with complex multiple medical issues. He sees me from cardiac standpoint and has multiple specialty involved in his care. He has known CAD by coronary angiogram remotely in 2006 and stress test in 2012 was non ischemic. He also states that he has had one artery that was stenosed by coronary angiography that was performed at [facility] 1.5 years ago.  Patient is now admitted with osteomyelitis of his right foot. He complained of chest pain on admission associated with shortness of breath and orthopnea hence I was consulted to give opinion.  Chest pain was continuous, persisted all day yesterday and all night. He states that his chest feels sore, he was not sure whether this related to cervical spine issues versus GERD or CAD. He also noticed a couple of episodes of shortness of breath and orthopnea yesterday. At present he states that he's feeling well and has not had any recurrence since this afternoon. He also had chest pain continuously while during the testing of echocardiogram.  He denies any fever, nausea, vomiting. Patient has history of severe peripheral arterial disease with multiple admission to the hospital for osteomyelitis of his toes, has had toe amputations in both the lower extremities, small vessel angioplasty by Dr. Brunetta [name] in [place], [place].  His other medical history includes COPD, degenerative joint disease and spine disease, uncontrolled diabetes mellitus, Barrett's esophagus and sleep apnea. He also has chronic back pain and is undergone both lumbar and cervical spine operations in 2007 and 2012. He has had carotid endarterectomy on 11/10/2010.  He is followed by Dr. Victorino [name].  Prior surgical history includes cholecystectomy in 2006, penile implant 2006, arthroscopy right knee 2003. Shoulder arthroscopy. Amputation his foot in 2013 and 2014.  Past Medical History  Diagnosis Date  . Carotid artery occlusion 11/10/10    LEFT CAROTID ENDARTERECTOMY  . Diabetes mellitus   . Hypertension   . Hyperlipidemia   . COPD (chronic obstructive pulmonary disease)   . DJD (degenerative joint disease)   . GERD (gastroesophageal reflux disease)   . Substernal chest pain   . 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 Slurred speech     AS PER WIFE IN D/C NOTE 11/10/10  . PAD (peripheral artery disease)     Distal aortogram June 2012. Atherectomy left popliteal artery July 2012.   . Barrett's esophagus   . Fatty liver   . Diverticulosis of colon (without mention of hemorrhage)   . Emphysema of lung   . Osteomyelitis   . Complication of anesthesia     BP WENT UP AT [facility] "  . Sleep apnea     has not used CPAP in e years - took self off  . H/O hiatal hernia   . Neuromuscular disorder     peripheral neuropathy  . Full dentures   . Wears glasses      Past Surgical History  Procedure Laterality Date  . Laminectomy      X 3 LUMBAR AND X 2 CERVICAL SPINE OPERATIONS  . Carotid endarterectomy  11/10/10  . Hernia repair      LEFT INGUINAL AND UMBILICAL REPAIRS  .  Anterior fusion cervical spine  02/06/06    C4-5, C5-6, C6-7; SURGEON DR. MAX [name]  . Penile prosthesis implant  08/14/05    INFRAPUBIC INSERTION OF INFLATABLE PENILE PROSTHESIS; SURGEON DR. Amalia [name]  . Laparoscopic cholecystectomy w/ cholangiography  11/09/04    SURGEON DR. Luella [name]  . Total knee arthroplasty  07/2002    RIGHT KNEE ; SURGEON  DR. [name] ALSO HAD ARTHROSCOPIC RIGHT KNEE IN  10/2001  . Shoulder arthroscopy    . Cholecystectomy    . Amputation  11/05/2011    Procedure: AMPUTATION RAY;  Surgeon: Harold Simmer, MD;  Location: [place];  Service:  Orthopedics;  Laterality: Right;  Amputation of Right 4&5th Toes  . Femoral artery stent      x6  . Amputation Left 11/26/2012    Procedure: AMPUTATION RAY;  Surgeon: Harold Simmer, MD;  Location: [place];  Service: Orthopedics;  Laterality: Left;  fourth ray amputation  . Cardiac catheterization  10/31/04    2009  . Carpal tunnel release Right 10/21/2013    Procedure: RIGHT CARPAL TUNNEL RELEASE;  Surgeon: Harold Sours, MD;  Location: [place];  Service: Orthopedics;  Laterality: Right;  . Ulnar nerve transposition Right 10/21/2013    Procedure: RIGHT ELBOW  ULNAR NERVE DECOMPRESSION;  Surgeon: Harold Sours, MD;  Location: [place];  Service: Orthopedics;  Laterality: Right;  . Joint replacement Right 2001    Total  . Spine surgery    . Neck surgery    . Esophageal manometry Bilateral 07/19/2014    Procedure: ESOPHAGEAL MANOMETRY (EM);  Surgeon: Harold Bears, MD;  Location: [facility];  Service: Gastroenterology;  Laterality: Bilateral;  . Lower extremity angiogram N/A 03/19/2012    Procedure: LOWER EXTREMITY ANGIOGRAM;  Surgeon: Harold Blanks, MD;  Location: [facility] CATH LAB;  Service: Cardiovascular;  Laterality: N/A;  . Amputation Right 08/27/2014    Procedure: Transmetatarsal Amputation;  Surgeon: Harold Minion, MD;  Location: [place];  Service: Orthopedics;  Laterality: Right;     Family History  Problem Relation Age of Onset  . Heart disease Father     Before age 83-  CAD, BPG  . Diabetes Father     Amputation  . Cancer Father     PROSTATE  . Hyperlipidemia Father   . Hypertension Father   . Heart attack Father     Triple BPG  . Arthritis      GRANDMOTHER  . Hypertension      OTHER FAMILY MEMBERS  . Colon cancer Brother   . Cancer Brother     Colon  . Diabetes Brother   . Heart disease Brother 19    A-Fib. Before age 48  . Hyperlipidemia Brother   . Hypertension Brother   . Cancer Sister     Breast     Social History: History    Social History  . Marital Status: Married    Spouse Name: N/A    Number of Children: N/A  . Years of Education: N/A   Occupational History  . TRUCK DRIVER    Social History Main Topics  . Smoking status: Former Smoker -- 0.50 packs/day for 35 years    Types: Cigarettes    Quit date: 10/31/2011  . Smokeless tobacco: Current User     Comment: VAPOR CIGARETTES  . Alcohol Use: No     Comment: ONCE IN A WHILE  . Drug Use: No  . Sexual Activity: Yes  Birth Control/ Protection: Implant   Other Topics Concern  . Not on file   Social History Narrative   HEAVY SMOKER AND CONTINUES TO SMOKE 1 PPD. DOES NOT EXERCISE REGULARLY.    Wife [phone] Lebron Quam). Has 2 sons and daughter. Still active                 Prescriptions prior to admission  Medication Sig Dispense Refill Last Dose  . ALPRAZolam (XANAX) 0.5 MG tablet Take 0.25 mg by mouth 3 (three) times daily.    09/26/2014 at Unknown time  . aspirin EC 81 MG tablet Take 81 mg by mouth daily.   09/26/2014 at Unknown time  . atorvastatin (LIPITOR) 40 MG tablet Take 40 mg by mouth daily.   09/26/2014 at Unknown time  . AZOR 5-20 MG per tablet Take 1 tablet by mouth daily.   0 09/26/2014 at Unknown time  . B Complex-C (B-COMPLEX WITH VITAMIN C) tablet Take 1 tablet by mouth daily.   09/26/2014 at Unknown time  . carvedilol (COREG) 25 MG tablet Take 25 mg by mouth every morning.    09/26/2014 at 8a  . carvedilol (COREG) 6.25 MG tablet Take 6.25 mg by mouth every evening.    09/26/2014 at 7p  . clopidogrel (PLAVIX) 75 MG tablet Take 75 mg by mouth daily. 1 tab daily   09/26/2014 at Unknown time  . ezetimibe (ZETIA) 10 MG tablet Take 10 mg by mouth every morning.   09/26/2014 at Unknown time  . fentaNYL (DURAGESIC - DOSED MCG/HR) 50 MCG/HR Place 1 patch onto the skin every 3 (three) days.   09/26/2014 at Unknown time  . furosemide (LASIX) 80 MG tablet Take 80 mg by mouth daily.   09/26/2014 at Unknown time  . glyBURIDE-metformin  (GLUCOVANCE) 2.5-500 MG per tablet Take 2 tablets by mouth 2 (two) times daily.    09/26/2014 at Unknown time  . HYDROmorphone (DILAUDID) 2 MG tablet Take 1 tablet (2 mg total) by mouth every 4 (four) hours as needed for pain. 30 tablet 0 09/26/2014 at Unknown time  . insulin glargine (LANTUS) 100 UNIT/ML injection Inject 50 Units into the skin at bedtime.    09/25/2014 at Unknown time  . insulin glulisine (APIDRA) 100 UNIT/ML injection Inject 10-20 Units into the skin 3 (three) times daily before meals. 10 breakfast 10 lunch and 20 supper   09/26/2014 at Unknown time  . lansoprazole (PREVACID) 30 MG capsule Take 30 mg by mouth 2 (two) times daily.   09/26/2014 at Unknown time  . Linaclotide (LINZESS) 145 MCG CAPS capsule Take 1 capsule (145 mcg total) by mouth daily. 30 capsule 0 09/26/2014 at Unknown time  . meloxicam (MOBIC) 15 MG tablet Take 15 mg by mouth daily.   09/26/2014 at Unknown time  . Multiple Vitamin (MULTIVITAMIN) tablet Take 1 tablet by mouth daily.   09/26/2014 at Unknown time  . nitroGLYCERIN (NITROSTAT) 0.4 MG SL tablet Place 1 tablet (0.4 mg total) under the tongue every 5 (five) minutes as needed (as needed for esophageal spasm). 30 tablet 1 unknown at Unknown time  . omega-3 acid ethyl esters (LOVAZA) 1 G capsule Take 2,000 mg by mouth 2 (two) times daily.    09/26/2014 at Unknown time  . ondansetron (ZOFRAN-ODT) 4 MG disintegrating tablet Take 4 mg by mouth 4 (four) times daily as needed for nausea.   Past Month at Unknown time  . oxyCODONE-acetaminophen (PERCOCET/ROXICET) 5-325 MG per tablet Take 1 tablet by mouth every 8 (  eight) hours as needed. 30 tablet 0 Past Week at Unknown time  . pregabalin (LYRICA) 100 MG capsule Take 100 mg by mouth 3 (three) times daily.   09/26/2014 at Unknown time  . rOPINIRole (REQUIP) 2 MG tablet Take 2 mg by mouth at bedtime.    09/26/2014 at Unknown time  . Tamsulosin HCl (FLOMAX) 0.4 MG CAPS Take 0.4 mg by mouth daily.   09/26/2014 at Unknown time     Scheduled Meds: . ALPRAZolam  0.25 mg Oral TID  . aspirin EC  81 mg Oral Daily  . atorvastatin  40 mg Oral Daily  . B-complex with vitamin C  1 tablet Oral Daily  . carvedilol  25 mg Oral Q breakfast  . carvedilol  6.25 mg Oral QPM  . ceFEPime (MAXIPIME) IV  1 g Intravenous Q8H  . clopidogrel  75 mg Oral Daily  . ezetimibe  10 mg Oral q morning - 10a  . feeding supplement (GLUCERNA SHAKE)  237 mL Oral BID BM  . fentaNYL  50 mcg Transdermal Q72H  . furosemide  80 mg Oral Daily  . heparin  5,000 Units Subcutaneous 3 times per day  . hydrALAZINE  25 mg Oral 3 times per day  . insulin aspart  0-15 Units Subcutaneous TID WC  . insulin aspart  0-5 Units Subcutaneous QHS  . insulin glargine  50 Units Subcutaneous QHS  . Linaclotide  145 mcg Oral Daily  . metroNIDAZOLE  500 mg Oral 3 times per day  . nitroGLYCERIN  0.5 inch Topical 4 times per day  . omega-3 acid ethyl esters  2,000 mg Oral BID  . pantoprazole  20 mg Oral Daily  . pregabalin  75 mg Oral TID  . rOPINIRole  2 mg Oral QHS  . tamsulosin  0.4 mg Oral Daily  . vancomycin  1,000 mg Intravenous Q12H   Continuous Infusions:  PRN Meds:.guaiFENesin-dextromethorphan, hydrALAZINE, HYDROcodone-acetaminophen, HYDROmorphone (DILAUDID) injection, nitroGLYCERIN, ondansetron **OR** ondansetron (ZOFRAN) IV, ondansetron, polyethylene glycol  ROS: General: no fevers/chills/night sweats Eyes: no blurry vision, diplopia, or amaurosis ENT: no sore throat or hearing loss Resp: no cough, wheezing, or hemoptysis CV: no edema or palpitations GI: no abdominal pain, nausea, vomiting, diarrhea, or constipation GU: no dysuria, frequency, or hematuria Skin: no rash Neuro: no headache, numbness, tingling, or weakness of extremities Musculoskeletal: no joint pain or swelling Heme: no bleeding, DVT, or easy bruising Endo: no polydipsia or polyuria    Physical Exam: Blood pressure 140/67, pulse 51, temperature 97.8 F (36.6 C), temperature  source Oral, resp. rate 18, height 6\' 2"  (1.88 m), weight 113.263 kg (249 lb 11.2 oz), SpO2 98 %.   General appearance: alert, cooperative, appears stated age, no distress and mildly obese Lungs: Occasional scattered bibasilar crackles, fine. Chest wall: Mild tenderness in the left precordial area. Heart: regular rate and rhythm, S1, S2 normal, no murmur, click, rub or gallop Abdomen: soft, non-tender; bowel sounds normal; no masses,  no organomegaly Extremities: Patient has left 2 toes amputated in the remote past and has healed well. Also has right 2 toes amputated laterally. There is a wound on the lateral aspect of the right foot. The lower extremities otherwise have full range of movements. There is no tenderness, there is trace leg edema on the left and 2+ right distal leg and ankle edema. Neurologic: Grossly normal  VASCULAR EXAM:  Skin breakdown right foot. Missing right 4-5 toes and left 4th toe (amputations). Carotids Left CEA scar present. Soft bilateral bruit  present. Extremities: Femoral pulse normal amplitude with faint bruit. Popliteal pulse normal ; Pedal pulse normal DP 2-3 plus bilateral and PT faint left and could not be palpated right due to edema. Otherwise No prominent pulse felt in the abdomen. No varicose veins.  Labs:   Lab Results  Component Value Date   WBC 4.1 09/27/2014   HGB 10.8* 09/27/2014   HCT 30.7* 09/27/2014   MCV 83.0 09/27/2014   PLT 169 09/27/2014    Recent Labs Lab 09/26/14 1405 09/27/14 0430  NA 136 137  K 3.9 4.1  CL 101 101  CO2 25 31  BUN 21 21  CREATININE 1.28 1.26  CALCIUM 8.9 9.1  PROT 6.4  --   BILITOT 0.7  --   ALKPHOS 119*  --   ALT 32  --   AST 35  --   GLUCOSE 265* 195*   Lab Results  Component Value Date   CKTOTAL 101 06/27/2012   CKMB 2.2 11/02/2011   TROPONINI <0.03 09/27/2014    Cardiac Panel (last 3 results)  Recent Labs  09/27/14 0839 09/27/14 1425  TROPONINI <0.03 <0.03     EKG: 09/26/2014: Normal  sinus rhythm with nonspecific lateral T wave inversion cannot exclude ischemia. No significant change from Office EKG 10/02/2013: Normal sinus rhythm at rate of 91 bpm, left axis deviation, left anterior fascicular block. Poor R-wave progression, pulmonary disease pattern. LVH.  Echocardiogram 09/27/2014: Normal LV systolic function, moderate LVH, grade 2 diastolic dysfunction with evidence of elevated LVEDP/LA pressure. No significant valvular abnormality. Mild left atrial enlargement.    Radiology: Dg Chest Port 1 View  09/26/2014   CLINICAL DATA:  Right foot infection. Amputation 4 weeks ago. Emphysema. Hypertension. Cough.  EXAM: PORTABLE CHEST - 1 VIEW  COMPARISON:  11/25/2012  FINDINGS: Gas beneath the left hemidiaphragm is presumably gastric although not entirely specific on today's semi erect chest view.  Mild atelectasis along the left hemidiaphragm. Low lung volumes. Thoracic spondylosis. Lower cervical plate and screw fixator.  Mild central airway thickening.  IMPRESSION: 1. Airway thickening is present, suggesting bronchitis or reactive airways disease. 2. Mild atelectasis along the left hemidiaphragm. 3. Thoracic spondylosis.   Electronically Signed   By: Sherryl Barters M.D.   On: 09/26/2014 20:34   Dg Foot Complete Right  09/26/2014   CLINICAL DATA:  Status post surgery for infection of the right foot 08/28/2015. Draining wound on the lateral aspect of the foot.  EXAM: RIGHT FOOT COMPLETE - 3+ VIEW  COMPARISON:  Plain films of the right foot 08/24/2014. MRI right foot 08/25/2014.  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 Bandaging is present over the wound. No soft tissue gas collection, radiopaque foreign body or bony destructive change is seen. Severe first MTP joint space narrowing and bulky osteophytosis or heterotopic calcification about the first MTP joint is noted. Soft tissue swelling about  the foot appears worsened compared to the prior exams.  IMPRESSION: Negative for plain film evidence of osteomyelitis.  Marked soft tissue swelling about the foot.  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   Electronically Signed   By: Inge Rise M.D.   On: 09/26/2014 14:46    ASSESSMENT AND PLAN:  1. Atypical chest pain, most probably is not angina pectoris. He also has reproducible pain, history of significant GERD and spinal stenosis involving  multilevel including neck. 2. Shortness of breath and dyspnea on exertion, suspect chronic diastolic heart failure. Acute decompensation appears unlikely however given his medical issues, if he continues to have persistent dyspnea may need more aggressive diuresis. 3. Pseudoclaudication, Spinal stenosis at L4-L5 level. 4. Peripheral artery disease  H/O Left popliteal artery atherectomy in June 2012. Right below knee angioplasty in 2013 and left below knee PTA 2014 by Dr. Brunetta [name] at [place]. Repeat angiogram 09/15/2013: Patent angioplasty sites. Has had diabetic foot ulcer and amputations in the past. 5. Hypertension, benign  Echo: 09/28/2012 At [facility]: Normal LVEF. Mild LVH. No significant valvular abnormalities EKG 06/25/2014: Normal sinus rhythm at the rate of 64 bpm, left atrial abnormality, left axis deviation, left anterior fascicular block. LVH with repolarization abnormality, cannot exclude high lateral wall ischemia. 6. Controlled diabetes mellitus II with diabetic foot ulcer.  Recommendation: Agree with management with hydralazine for now for hypertension management. If blood pressure remains elevated, would consider addition of a diuretic. I do not think that he is unstable angina pectoris, in spite of continuous chest pain for several hours, serum troponin is negative and there is no new EKG abnormalities and echo shows no wall motion of normality. His shortness of breath  and orthopnea may be related to diastolic heart failure, also as evidenced by grade 2 diastolic dysfunction on echocardiogram.  On examination he has got good peripheral pulses, I do not suspect peripheral arterial disease to be the etiology for his nonhealing foot ulceration, I suspect diabetic foot. I also suspect his long-term chances of healing is probably good due to excellent blood flow and a very palpable dorsalis pedis pulse, may need long-term IV antibiotic therapy in the outpatient basis. I'll continue to follow the patient along with you. If he has recurrence of shortness of breath, would certainly add diuretic for his management of hypertension. Thank you for asking me to see the patient.  Laverda Page, MD 09/27/2014, 6:34 PM [facility] [place] Pager: [phone] Office: [phone] If no answer Cell [phone]

## 2014-09-27 NOTE — Progress Notes (Signed)
VASCULAR LAB PRELIMINARY  ARTERIAL  ABI completed:    RIGHT    LEFT    PRESSURE WAVEFORM  PRESSURE WAVEFORM  BRACHIAL 157 Triphasic BRACHIAL 161 Triphasic  DP 171 Triphasic DP 211 Triphasic  PT 199 Triphasic PT 199 Triphasic  GREAT TOE 64 NA GREAT TOE 100 NA    RIGHT LEFT  ABI / TBI 1.24 / 0.40 1.31 / 0.62   ABIs and Doppler waveforms indicate normal arterial flow with mild elevation possibly due to early calcification.  Right great toe pressure indicates a low adequacy  perfusion for vascular healing while the left Great toe pressure indicates adequate perfusion for vascular healing.  Lusero Nordlund, RVS 09/27/2014, 11:26 AM

## 2014-09-27 NOTE — Progress Notes (Signed)
Inpatient Diabetes Program Recommendations  AACE/ADA: New Consensus Statement on Inpatient Glycemic Control (2013)  Target Ranges:  Prepandial:   less than 140 mg/dL      Peak postprandial:   less than 180 mg/dL (1-2 hours)      Critically ill patients:  140 - 180 mg/dL   Consult received regarding previous foot ulcers and transmetatarsal amputations. HgbA1C shows good control at 6.5%. Pt having trouble eating due to problems with his teeth. Will review with patient foot care and inspection, will have him view the ed'l videos per system network and print ed'l materials    Thank you, Rosita Kea, RN, CNS, Diabetes Coordinator [phone])

## 2014-09-27 NOTE — Progress Notes (Signed)
INITIAL NUTRITION ASSESSMENT  DOCUMENTATION CODES Per approved criteria  -Obesity Unspecified   INTERVENTION: -Glucerna BID -Continue to monitor wound healing  NUTRITION DIAGNOSIS: Increased nutrient needs related to wound healing as evidenced by estimated needs.   Goal: Pt to meet >/=90% of needs with meals and supplements.  Monitor:  Pt PO supplement intake, weight, labs, wound healing  Reason for Assessment: Consult per MD  59 y.o. male  Admitting Dx: Cellulitis of foot, right  ASSESSMENT: Pt presents with pressure ulcer on left foot secondary to type II DM.  Pt hx of PAD, COPD, GERD, underwent right fourth and fifth toe amputation a few years ago.   Pt reports his appetite has been good and has not noticed weight loss recently. Pt reports he has difficulty chewing tough meats because of his dentures and will usually give up eating a meat if it is too tough. Pt reports not being able to finish eating his pork chop today for this reason. He prefers to eat vegetables. Pt appeared well nourished.  He reports his blood sugars have been more controlled than they used to.  Pt willing to try Glucerna supplement to increase protein intake for wound healing.  Offered to downgrade diet but pt refused.   Height: Ht Readings from Last 1 Encounters:  09/26/14 6\' 2"  (1.88 m)    Weight: Wt Readings from Last 1 Encounters:  09/27/14 249 lb 11.2 oz (113.263 kg)    Ideal Body Weight: 190 lbs  % Ideal Body Weight: 131%  Wt Readings from Last 10 Encounters:  09/27/14 249 lb 11.2 oz (113.263 kg)  08/24/14 250 lb (113.399 kg)  08/17/14 250 lb (113.399 kg)  08/09/14 245 lb (111.131 kg)  06/29/14 245 lb 6 oz (111.301 kg)  02/09/14 248 lb (112.492 kg)  10/21/13 246 lb (111.585 kg)  07/23/12 248 lb 12.8 oz (112.855 kg)  07/08/12 241 lb (109.317 kg)  07/03/12 241 lb (109.317 kg)    Usual Body Weight: 250 lbs  % Usual Body Weight: 99%  BMI:  Body mass index is 32.05  kg/(m^2).  Estimated Nutritional Needs: Kcal: 2300-2500 kcals Protein: 110-130 g protein Fluid: >/= 2200 mL/day  Skin: Pressure Ulcer on right foot secondary to Type II DM.  Diet Order: Diet Carb Modified     Intake/Output Summary (Last 24 hours) at 09/27/14 1136 Last data filed at 09/27/14 0551  Gross per 24 hour  Intake    360 ml  Output    700 ml  Net   -340 ml    Last BM: not charted    Labs:   Recent Labs Lab 09/26/14 1405 09/27/14 0430  NA 136 137  K 3.9 4.1  CL 101 101  CO2 25 31  BUN 21 21  CREATININE 1.28 1.26  CALCIUM 8.9 9.1  GLUCOSE 265* 195*    CBG (last 3)   Recent Labs  09/26/14 1846 09/26/14 2142 09/27/14 0728  GLUCAP 193* 236* 135*    Scheduled Meds: . ALPRAZolam  0.25 mg Oral TID  . aspirin EC  81 mg Oral Daily  . atorvastatin  40 mg Oral Daily  . B-complex with vitamin C  1 tablet Oral Daily  . carvedilol  25 mg Oral Q breakfast  . carvedilol  6.25 mg Oral QPM  . ceFEPime (MAXIPIME) IV  1 g Intravenous Q8H  . clopidogrel  75 mg Oral Daily  . ezetimibe  10 mg Oral q morning - 10a  . fentaNYL  50 mcg Transdermal  Q72H  . furosemide  80 mg Oral Daily  . heparin  5,000 Units Subcutaneous 3 times per day  . insulin aspart  0-15 Units Subcutaneous TID WC  . insulin aspart  0-5 Units Subcutaneous QHS  . insulin glargine  50 Units Subcutaneous QHS  . Linaclotide  145 mcg Oral Daily  . metroNIDAZOLE  500 mg Oral 3 times per day  . nitroGLYCERIN  0.5 inch Topical 4 times per day  . omega-3 acid ethyl esters  2,000 mg Oral BID  . pantoprazole  20 mg Oral Daily  . pregabalin  75 mg Oral TID  . rOPINIRole  2 mg Oral QHS  . tamsulosin  0.4 mg Oral Daily  . vancomycin  1,000 mg Intravenous Q12H    Continuous Infusions:   Past Medical History  Diagnosis Date  . Carotid artery occlusion 11/10/10    LEFT CAROTID ENDARTERECTOMY  . Diabetes mellitus   . Hypertension   . Hyperlipidemia   . COPD (chronic obstructive pulmonary disease)    . DJD (degenerative joint disease)   . GERD (gastroesophageal reflux disease)   . Substernal chest pain   . 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 Slurred speech     AS PER WIFE IN D/C NOTE 11/10/10  . PAD (peripheral artery disease)     Distal aortogram June 2012. Atherectomy left popliteal artery July 2012.   . Barrett's esophagus   . Fatty liver   . Diverticulosis of colon (without mention of hemorrhage)   . Emphysema of lung   . Osteomyelitis   . Complication of anesthesia     BP WENT UP AT [facility] "  . Sleep apnea     has not used CPAP in e years - took self off  . H/O hiatal hernia   . Neuromuscular disorder     peripheral neuropathy  . Full dentures   . Wears glasses     Past Surgical History  Procedure Laterality Date  . Laminectomy      X 3 LUMBAR AND X 2 CERVICAL SPINE OPERATIONS  . Carotid endarterectomy  11/10/10  . Hernia repair      LEFT INGUINAL AND UMBILICAL REPAIRS  . Anterior fusion cervical spine  02/06/06    C4-5, C5-6, C6-7; SURGEON DR. MAX COHEN  . Penile prosthesis implant  08/14/05    INFRAPUBIC INSERTION OF INFLATABLE PENILE PROSTHESIS; SURGEON DR. Amalia Hailey  . Laparoscopic cholecystectomy w/ cholangiography  11/09/04    SURGEON DR. Luella Cook  . Total knee arthroplasty  07/2002    RIGHT KNEE ; SURGEON  DR. [name] ALSO HAD ARTHROSCOPIC RIGHT KNEE IN  10/2001  . Shoulder arthroscopy    . Cholecystectomy    . Amputation  11/05/2011    Procedure: AMPUTATION RAY;  Surgeon: Wylene Simmer, MD;  Location: [place];  Service: Orthopedics;  Laterality: Right;  Amputation of Right 4&5th Toes  . Femoral artery stent      x6  . Amputation Left 11/26/2012    Procedure: AMPUTATION RAY;  Surgeon: Wylene Simmer, MD;  Location: [place];  Service: Orthopedics;  Laterality: Left;  fourth ray amputation  . Cardiac catheterization  10/31/04    2009  . Carpal tunnel release Right 10/21/2013    Procedure: RIGHT CARPAL TUNNEL RELEASE;  Surgeon: Wynonia Sours, MD;   Location: [place];  Service: Orthopedics;  Laterality: Right;  . Ulnar nerve transposition Right 10/21/2013  Procedure: RIGHT ELBOW  ULNAR NERVE DECOMPRESSION;  Surgeon: Wynonia Sours, MD;  Location: [place];  Service: Orthopedics;  Laterality: Right;  . Joint replacement Right 2001    Total  . Spine surgery    . Neck surgery    . Esophageal manometry Bilateral 07/19/2014    Procedure: ESOPHAGEAL MANOMETRY (EM);  Surgeon: Jerene Bears, MD;  Location: [facility];  Service: Gastroenterology;  Laterality: Bilateral;  . Lower extremity angiogram N/A 03/19/2012    Procedure: LOWER EXTREMITY ANGIOGRAM;  Surgeon: Burnell Blanks, MD;  Location: [facility] C[facility];  Service: Cardiovascular;  Laterality: N/A;  . Amputation Right 08/27/2014    Procedure: Transmetatarsal Amputation;  Surgeon: Newt Minion, MD;  Location: [place];  Service: Orthopedics;  Laterality: Right;    Elmer Picker MS Dietetic Intern Pager Number [phone]

## 2014-09-27 NOTE — Consult Note (Signed)
Reason for Consult: Abscess right foot status post fourth and fifth ray amputation Referring Physician: Dr. Sharlynn Oliphant is an 59 y.o. male.  HPI: Patient is a 59 year old gentleman with diabetic insensate neuropathy who is status post fourth and fifth ray amputation. Patient states he noticed acute infection in his right foot over the weekend he was admitted started on IV antibiotics.  Past Medical History  Diagnosis Date  . Carotid artery occlusion 11/10/10    LEFT CAROTID ENDARTERECTOMY  . Diabetes mellitus   . Hypertension   . Hyperlipidemia   . COPD (chronic obstructive pulmonary disease)   . DJD (degenerative joint disease)   . GERD (gastroesophageal reflux disease)   . Substernal chest pain   . 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 Slurred speech     AS PER WIFE IN D/C NOTE 11/10/10  . PAD (peripheral artery disease)     Distal aortogram June 2012. Atherectomy left popliteal artery July 2012.   . Barrett's esophagus   . Fatty liver   . Diverticulosis of colon (without mention of hemorrhage)   . Emphysema of lung   . Osteomyelitis   . Complication of anesthesia     BP WENT UP AT [facility] "  . Sleep apnea     has not used CPAP in e years - took self off  . H/O hiatal hernia   . Neuromuscular disorder     peripheral neuropathy  . Full dentures   . Wears glasses     Past Surgical History  Procedure Laterality Date  . Laminectomy      X 3 LUMBAR AND X 2 CERVICAL SPINE OPERATIONS  . Carotid endarterectomy  11/10/10  . Hernia repair      LEFT INGUINAL AND UMBILICAL REPAIRS  . Anterior fusion cervical spine  02/06/06    C4-5, C5-6, C6-7; SURGEON DR. MAX COHEN  . Penile prosthesis implant  08/14/05    INFRAPUBIC INSERTION OF INFLATABLE PENILE PROSTHESIS; SURGEON DR. Amalia Hailey  . Laparoscopic cholecystectomy w/ cholangiography  11/09/04    SURGEON DR. Luella Cook  . Total knee arthroplasty  07/2002    RIGHT KNEE ; SURGEON  DR. [name] ALSO HAD  ARTHROSCOPIC RIGHT KNEE IN  10/2001  . Shoulder arthroscopy    . Cholecystectomy    . Amputation  11/05/2011    Procedure: AMPUTATION RAY;  Surgeon: Wylene Simmer, MD;  Location: [place];  Service: Orthopedics;  Laterality: Right;  Amputation of Right 4&5th Toes  . Femoral artery stent      x6  . Amputation Left 11/26/2012    Procedure: AMPUTATION RAY;  Surgeon: Wylene Simmer, MD;  Location: [place];  Service: Orthopedics;  Laterality: Left;  fourth ray amputation  . Cardiac catheterization  10/31/04    2009  . Carpal tunnel release Right 10/21/2013    Procedure: RIGHT CARPAL TUNNEL RELEASE;  Surgeon: Wynonia Sours, MD;  Location: [place];  Service: Orthopedics;  Laterality: Right;  . Ulnar nerve transposition Right 10/21/2013    Procedure: RIGHT ELBOW  ULNAR NERVE DECOMPRESSION;  Surgeon: Wynonia Sours, MD;  Location: [place];  Service: Orthopedics;  Laterality: Right;  . Joint replacement Right 2001    Total  . Spine surgery    . Neck surgery    . Esophageal manometry Bilateral 07/19/2014    Procedure: ESOPHAGEAL MANOMETRY (EM);  Surgeon: Jerene Bears, MD;  Location: [facility];  Service: Gastroenterology;  Laterality: Bilateral;  . Lower extremity angiogram N/A 03/19/2012    Procedure: LOWER EXTREMITY ANGIOGRAM;  Surgeon: Burnell Blanks, MD;  Location: [facility] CATH LAB;  Service: Cardiovascular;  Laterality: N/A;  . Amputation Right 08/27/2014    Procedure: Transmetatarsal Amputation;  Surgeon: Newt Minion, MD;  Location: [place];  Service: Orthopedics;  Laterality: Right;    Family History  Problem Relation Age of Onset  . Heart disease Father     Before age 79-  CAD, BPG  . Diabetes Father     Amputation  . Cancer Father     PROSTATE  . Hyperlipidemia Father   . Hypertension Father   . Heart attack Father     Triple BPG  . Arthritis      GRANDMOTHER  . Hypertension      OTHER FAMILY MEMBERS  . Colon cancer Brother   . Cancer Brother     Colon   . Diabetes Brother   . Heart disease Brother 96    A-Fib. Before age 83  . Hyperlipidemia Brother   . Hypertension Brother   . Cancer Sister     Breast    Social History:  reports that he quit smoking about 2 years ago. His smoking use included Cigarettes. He has a 17.5 pack-year smoking history. He uses smokeless tobacco. He reports that he does not drink alcohol or use illicit drugs.  Allergies:  Allergies  Allergen Reactions  . Ivp Dye [Iodinated Diagnostic Agents] Anaphylaxis    Breathing problems  . Adhesive [Tape] Rash    Medications: I have reviewed the patient's current medications.  Results for orders placed or performed during the hospital encounter of 09/26/14 (from the past 48 hour(s))  CBC with Differential     Status: Abnormal   Collection Time: 09/26/14  2:05 PM  Result Value Ref Range   WBC 5.0 4.0 - 10.5 K/uL   RBC 3.66 (L) 4.22 - 5.81 MIL/uL   Hemoglobin 10.6 (L) 13.0 - 17.0 g/dL   HCT 30.0 (L) 39.0 - 52.0 %   MCV 82.0 78.0 - 100.0 fL   MCH 29.0 26.0 - 34.0 pg   MCHC 35.3 30.0 - 36.0 g/dL   RDW 13.0 11.5 - 15.5 %   Platelets 167 150 - 400 K/uL   Neutrophils Relative % 66 43 - 77 %   Neutro Abs 3.3 1.7 - 7.7 K/uL   Lymphocytes Relative 18 12 - 46 %   Lymphs Abs 0.9 0.7 - 4.0 K/uL   Monocytes Relative 13 (H) 3 - 12 %   Monocytes Absolute 0.7 0.1 - 1.0 K/uL   Eosinophils Relative 2 0 - 5 %   Eosinophils Absolute 0.1 0.0 - 0.7 K/uL   Basophils Relative 1 0 - 1 %   Basophils Absolute 0.0 0.0 - 0.1 K/uL  Comprehensive metabolic panel     Status: Abnormal   Collection Time: 09/26/14  2:05 PM  Result Value Ref Range   Sodium 136 135 - 145 mmol/L    Comment: Please note change in reference range.   Potassium 3.9 3.5 - 5.1 mmol/L    Comment: Please note change in reference range.   Chloride 101 96 - 112 mEq/L   CO2 25 19 - 32 mmol/L   Glucose, Bld 265 (H) 70 - 99 mg/dL   BUN 21 6 - 23 mg/dL   Creatinine, Ser 1.28 0.50 - 1.35 mg/dL   Calcium 8.9 8.4 -  10.5 mg/dL  Total Protein 6.4 6.0 - 8.3 g/dL   Albumin 3.7 3.5 - 5.2 g/dL   AST 35 0 - 37 U/L   ALT 32 0 - 53 U/L   Alkaline Phosphatase 119 (H) 39 - 117 U/L   Total Bilirubin 0.7 0.3 - 1.2 mg/dL   GFR calc non Af Amer 59 (L) >90 mL/min   GFR calc Af Amer 69 (L) >90 mL/min    Comment: (NOTE) The eGFR has been calculated using the CKD EPI equation. This calculation has not been validated in all clinical situations. eGFR's persistently <90 mL/min signify possible Chronic Kidney Disease.    Anion gap 10 5 - 15  CBG monitoring, ED     Status: Abnormal   Collection Time: 09/26/14  6:46 PM  Result Value Ref Range   Glucose-Capillary 193 (H) 70 - 99 mg/dL   Comment 1 Notify RN   Hemoglobin A1c     Status: Abnormal   Collection Time: 09/26/14  7:20 PM  Result Value Ref Range   Hgb A1c MFr Bld 6.5 (H) <5.7 %    Comment: (NOTE)                                                                       According to the ADA Clinical Practice Recommendations for 2011, when HbA1c is used as a screening test:  >=6.5%   Diagnostic of Diabetes Mellitus           (if abnormal result is confirmed) 5.7-6.4%   Increased risk of developing Diabetes Mellitus References:Diagnosis and Classification of Diabetes Mellitus,Diabetes EUMP,5361,44(RXVQM 1):S62-S69 and Standards of Medical Care in         Diabetes - 2011,Diabetes Care,2011,34 (Suppl 1):S11-S61.    Mean Plasma Glucose 140 (H) <117 mg/dL    Comment: Performed at [facility]  Sedimentation rate     Status: Abnormal   Collection Time: 09/26/14  7:20 PM  Result Value Ref Range   Sed Rate 30 (H) 0 - 16 mm/hr  C-reactive protein     Status: Abnormal   Collection Time: 09/26/14  7:20 PM  Result Value Ref Range   CRP 1.0 (H) <0.60 mg/dL    Comment: Performed at [facility]  Albumin     Status: None   Collection Time: 09/26/14  7:20 PM  Result Value Ref Range   Albumin 3.6 3.5 - 5.2 g/dL  Glucose, capillary     Status:  Abnormal   Collection Time: 09/26/14  9:42 PM  Result Value Ref Range   Glucose-Capillary 236 (H) 70 - 99 mg/dL   Comment 1 Notify RN   Basic metabolic panel     Status: Abnormal   Collection Time: 09/27/14  4:30 AM  Result Value Ref Range   Sodium 137 135 - 145 mmol/L    Comment: Please note change in reference range.   Potassium 4.1 3.5 - 5.1 mmol/L    Comment: Please note change in reference range.   Chloride 101 96 - 112 mEq/L   CO2 31 19 - 32 mmol/L   Glucose, Bld 195 (H) 70 - 99 mg/dL   BUN 21 6 - 23 mg/dL   Creatinine, Ser 1.26 0.50 - 1.35 mg/dL   Calcium 9.1  8.4 - 10.5 mg/dL   GFR calc non Af Amer 60 (L) >90 mL/min   GFR calc Af Amer 70 (L) >90 mL/min    Comment: (NOTE) The eGFR has been calculated using the CKD EPI equation. This calculation has not been validated in all clinical situations. eGFR's persistently <90 mL/min signify possible Chronic Kidney Disease.    Anion gap 5 5 - 15  CBC     Status: Abnormal   Collection Time: 09/27/14  4:30 AM  Result Value Ref Range   WBC 4.1 4.0 - 10.5 K/uL   RBC 3.70 (L) 4.22 - 5.81 MIL/uL   Hemoglobin 10.8 (L) 13.0 - 17.0 g/dL   HCT 30.7 (L) 39.0 - 52.0 %   MCV 83.0 78.0 - 100.0 fL   MCH 29.2 26.0 - 34.0 pg   MCHC 35.2 30.0 - 36.0 g/dL   RDW 13.4 11.5 - 15.5 %   Platelets 169 150 - 400 K/uL    Dg Chest Port 1 View  09/26/2014   CLINICAL DATA:  Right foot infection. Amputation 4 weeks ago. Emphysema. Hypertension. Cough.  EXAM: PORTABLE CHEST - 1 VIEW  COMPARISON:  11/25/2012  FINDINGS: Gas beneath the left hemidiaphragm is presumably gastric although not entirely specific on today's semi erect chest view.  Mild atelectasis along the left hemidiaphragm. Low lung volumes. Thoracic spondylosis. Lower cervical plate and screw fixator.  Mild central airway thickening.  IMPRESSION: 1. Airway thickening is present, suggesting bronchitis or reactive airways disease. 2. Mild atelectasis along the left hemidiaphragm. 3. Thoracic  spondylosis.   Electronically Signed   By: Sherryl Barters M.D.   On: 09/26/2014 20:34   Dg Foot Complete Right  09/26/2014   CLINICAL DATA:  Status post surgery for infection of the right foot 08/28/2015. Draining wound on the lateral aspect of the foot.  EXAM: RIGHT FOOT COMPLETE - 3+ VIEW  COMPARISON:  Plain films of the right foot 08/24/2014. MRI right foot 08/25/2014.  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 Bandaging is present over the wound. No soft tissue gas collection, radiopaque foreign body or bony destructive change is seen. Severe first MTP joint space narrowing and bulky osteophytosis or heterotopic calcification about the first MTP joint is noted. Soft tissue swelling about the foot appears worsened compared to the prior exams.  IMPRESSION: Negative for plain film evidence of osteomyelitis.  Marked soft tissue swelling about the foot.  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   Electronically Signed   By: Inge Rise M.D.   On: 09/26/2014 14:46    Review of Systems  All other systems reviewed and are negative.  Blood pressure 142/56, pulse 55, temperature 98.3 F (36.8 C), temperature source Oral, resp. rate 17, height _0  (1.88 m), weight 113.263 kg (249 lb 11.2 oz), SpO2 97 %. Physical Exam On examination patient is a small wound over the lateral aspect of his foot. There is purulent drainage. Assessment/Plan: Assessment draining abscess right foot status post fourth and fifth ray amputation.  Plan: We will continue with his IV antibiotics. If patient fails treatment with the IV antibiotics would need a higher level amputation. We will see how the antibiotics work.  Moritz Lever V 09/27/2014, 6:56 AM

## 2014-09-27 NOTE — Progress Notes (Signed)
BP 192/79, HR 53. NAD. Pain 8/10. Dr. Candiss Norse notified. Orders received.

## 2014-09-27 NOTE — Progress Notes (Signed)
  Echocardiogram 2D Echocardiogram has been performed.  Diamond Nickel 09/27/2014, 10:46 AM

## 2014-09-28 ENCOUNTER — Encounter (HOSPITAL_COMMUNITY)
Admission: EM | Disposition: A | Discharge status: Home / Self Care | Payer: Self-pay | Source: Home / Self Care | Attending: Internal Medicine

## 2014-09-28 LAB — HIV ANTIBODY (ROUTINE TESTING W REFLEX)
HIV 1/O/2 Abs-Index Value: <1 (ref <1.00)
HIV-1/HIV-2 Ab: NONREACTIVE

## 2014-09-28 LAB — GLUCOSE, CAPILLARY
Glucose-Capillary: 164 mg/dL — ABNORMAL HIGH (ref 70–99)
Glucose-Capillary: 208 mg/dL — ABNORMAL HIGH (ref 70–99)
Glucose-Capillary: 247 mg/dL — ABNORMAL HIGH (ref 70–99)
Glucose-Capillary: 287 mg/dL — ABNORMAL HIGH (ref 70–99)

## 2014-09-28 SURGERY — ANGIOGRAM, LOWER EXTREMITY

## 2014-09-28 MED ORDER — POLYETHYLENE GLYCOL 3350 17 G PO PACK
17.0000 g | PACK | Freq: Every day | ORAL | Status: DC
  Administered 2014-09-28 – 2014-09-29 (×2): 17 g via ORAL
  Filled 2014-09-28 (×3): qty 1

## 2014-09-28 MED ORDER — DOCUSATE SODIUM 100 MG PO CAPS
200.0000 mg | ORAL_CAPSULE | Freq: Two times a day (BID) | ORAL | Status: DC
  Administered 2014-09-28 – 2014-09-29 (×4): 200 mg via ORAL
  Filled 2014-09-28 (×4): qty 2

## 2014-09-28 NOTE — Progress Notes (Signed)
Inpatient Diabetes Program Recommendations  AACE/ADA: New Consensus Statement on Inpatient Glycemic Control (2013)  Target Ranges:  Prepandial:   less than 140 mg/dL      Peak postprandial:   less than 180 mg/dL (1-2 hours)      Critically ill patients:  140 - 180 mg/dL   Reason for Visit: Hyperglycemia following meals. Inpatient Diabetes Program Recommendations Insulin - Meal Coverage: Pt takes apidra at home with meals: 10 units breakfast and lunch and 20 units at supper.  Please add at least 4-5 units meal coverage while here. If not eating, please increase correction to moderate tidwc and HS Thank you, Rosita Kea, RN, CNS, Diabetes Coordinator [phone])

## 2014-09-28 NOTE — Progress Notes (Signed)
Subjective:  No further chest pain since being on Nitropaste.  Objective:  Vital Signs in the last 24 hours: Temp:  [97.7 F (36.5 C)-98.6 F (37 C)] 98.6 F (37 C) (01/19 1500) Pulse Rate:  [51-53] 53 (01/19 1500) Resp:  [16-18] 18 (01/19 1500) BP: (117-141)/(52-67) 117/63 mmHg (01/19 1500) SpO2:  [96 %-97 %] 97 % (01/19 1500) Weight:  [115.214 kg (254 lb)] 115.214 kg (254 lb) (01/19 0500)  Intake/Output from previous day: 01/18 0701 - 01/19 0700 In: [id number] [P.O.:1160; IV Piggyback:325] Out: 750 [Urine:750]  Physical Exam: General appearance: alert, cooperative, appears stated age, no distress and mildly obese Lungs: Occasional scattered bibasilar crackles, fine. Chest wall: Mild tenderness in the left precordial area. Heart: regular rate and rhythm, S1, S2 normal, no murmur, click, rub or gallop Abdomen: soft, non-tender; bowel sounds normal; no masses, no organomegaly Extremities: Patient has left 2 toes amputated in the remote past and has healed well. Also has right 2 toes amputated laterally. There is a wound on the lateral aspect of the right foot. The lower extremities otherwise have full range of movements. There is no tenderness, there is trace leg edema on the left and 2+ right distal leg and ankle edema. Neurologic: Grossly normal  VASCULAR EXAM: Right foot skin breakdown. Missing right 4-5 toes and left 4th toe (amputations). Carotids Left CEA scar present. Soft bilateral bruit present. Extremities: Femoral pulse normal amplitude with faint bruit. Popliteal pulse normal ; Pedal pulse normal DP 2-3 plus bilateral and PT faint left and could not be palpated right due to edema. Otherwise No prominent pulse felt in the abdomen. No varicose veins.  Lab Results: BMP  Recent Labs  08/28/14 0516 09/26/14 1405 09/27/14 0430  NA 138 136 137  K 3.9 3.9 4.1  CL 101 101 101  CO2 29 25 31   GLUCOSE 228* 265* 195*  BUN 16 21 21   CREATININE 1.01 1.28 1.26  CALCIUM 9.1 8.9  9.1  GFRNONAA 79* 59* 60*  GFRAA >90 69* 70*    CBC  Recent Labs Lab 09/26/14 1405 09/27/14 0430  WBC 5.0 4.1  RBC 3.66* 3.70*  HGB 10.6* 10.8*  HCT 30.0* 30.7*  PLT 167 169  MCV 82.0 83.0  MCH 29.0 29.2  MCHC 35.3 35.2  RDW 13.0 13.4  LYMPHSABS 0.9  --   MONOABS 0.7  --   EOSABS 0.1  --   BASOSABS 0.0  --     HEMOGLOBIN A1C Lab Results  Component Value Date   HGBA1C 6.5* 09/26/2014   MPG 140* 09/26/2014    Cardiac Panel (last 3 results)  Recent Labs  09/27/14 0839 09/27/14 1425 09/27/14 1957  TROPONINI <0.03 <0.03 <0.03    Hepatic Function Panel  Recent Labs  08/25/14 0739 08/28/14 0516 09/26/14 1405 09/26/14 1920  PROT 6.6 5.9* 6.4  --   ALBUMIN 3.4* 2.8* 3.7 3.6  AST 10 16 35  --   ALT 10 14 32  --   ALKPHOS 114 107 119*  --   BILITOT 0.5 0.2* 0.7  --    EKG: 09/26/2014: Normal sinus rhythm with nonspecific lateral T wave inversion cannot exclude ischemia. No significant change from Office EKG 10/02/2013: Normal sinus rhythm at rate of 91 bpm, left axis deviation, left anterior fascicular block. Poor R-wave progression, pulmonary disease pattern. LVH.  Echocardiogram 09/27/2014: Normal LV systolic function, moderate LVH, grade 2 diastolic dysfunction with evidence of elevated LVEDP/LA pressure. No significant valvular abnormality. Mild left atrial enlargement.  Assessment/Plan:  1. Atypical chest pain, most probably is not angina pectoris. He also has reproducible pain, history of significant GERD and spinal stenosis involving multilevel including neck. 2. Shortness of breath and dyspnea on exertion, suspect chronic diastolic heart failure.  3. Pseudoclaudication, Spinal stenosis at L4-L5 level. 4. Peripheral artery disease  H/O Left popliteal artery atherectomy in June 2012. Right below knee angioplasty in 2013 and left below knee PTA 2014 by Dr. Brunetta Jeans at [place]. Repeat angiogram 09/15/2013: Patent angioplasty sites. Has had diabetic foot ulcer  and amputations in the past. 5. Hypertension, benign  Echo: 09/28/2012 At [facility]: Normal LVEF. Mild LVH. No significant valvular abnormalities EKG 06/25/2014: Normal sinus rhythm at the rate of 64 bpm, left atrial abnormality, left axis deviation, left anterior fascicular block. LVH with repolarization abnormality, cannot exclude high lateral wall ischemia. 6. Controlled diabetes mellitus II with diabetic foot ulcer.  Recommend: From cardiac standpoint, he is stable, would recommend continuing Nitropaste for now, would discharge him on Imdur 60 mg by mouth daily.  With regard to antibiotic use, given his multiple comorbidities and ongoing diabetes, consideration for long-term IV antibiotics needs to be kept in mind.  Please call me for any further questions.    Laverda Page, M.D. 09/28/2014, 6:07 PM [facility] Pager: [phone] Office: [phone] If no answer: [phone]

## 2014-09-28 NOTE — Care Management (Signed)
An Important Message From Medicare About Patient's Rights given . Mckoy Bhakta RN BSN  

## 2014-09-28 NOTE — Progress Notes (Signed)
Patient ID: [name], male   DOB: 1956-08-21, 59 y.o.   MRN: [id number] Right foot has decreased cellulitis. The drainage is clear but there is still drainage from the lateral wound. Patient to have arterial study today. Patient still may require further surgery if the cellulitis drainage does not continue to improve.

## 2014-09-28 NOTE — Progress Notes (Signed)
Patient Demographics  [name], is a 59 y.o. male, DOB - 05/17/1956, [id number]  Admit date - 09/26/2014   Admitting Physician Thurnell Lose, MD  Outpatient Primary MD for the patient is [name], MD  LOS - 2   Chief Complaint  Patient presents with  . Post-op Problem        Subjective:   [name] today has, No headache, no chest pain or pressure, No abdominal pain - No Nausea, No new weakness tingling or numbness, No Cough - SOB.    Assessment & Plan    1. Right foot postoperative wound infection with minimal dehiscence. Recently admitted, empiric antibiotics addressing MRSA and possible pseudomonas on board, discussed with [name], trial of antibiotics for a few days and monitor wound closely.   2. DM type II. Check A1 C continue Lantus at sliding scale. Hold oral hypoglycemic agents.  Lab Results  Component Value Date   HGBA1C 6.5* 09/26/2014    CBG (last 3)   Recent Labs  09/27/14 1642 09/27/14 2112 09/28/14 0827  GLUCAP 205* 237* 164*      3. PAD. Continue Plavix, aspirin and statin for secondary prevention.   4. Essential hypertension. Home dose Coreg will be continued along with home dose Lasix. Monitor BMP intake and output closely.   5. Dyslipidemia continue statin along with Zetia.   6. GERD/Barrett's esophagus. Continue PPI. Outpatient follow-up with Dr. Hilarie Fredrickson post discharge if needed.   7. Diabetic peripheral neuropathy. Lyrica, which will be continued.   8. BPH. Continue Flomax.   9. Chest pressure. Seen by cardiologist [name], EKG nonacute per [name] shows changes suggested of LVH similar to previous office EKG. Troponin negative. Echo stable with EF of around 60% and no wall motion abnormality. Continue aspirin, Plavix,  beta blocker and statin for secondary prevention.   10. Chr. diastolic CHF. Continue beta blocker, hydralazine and Lasix.     Code Status: Full  Family Communication: wife  Disposition Plan: Home   Procedures   Echogram.  - Left ventricle: The cavity size was normal. There was moderateconcentric hypertrophy. Systolic function was normal. Theestimated ejection fraction was in the range of 55% to 60%. Wall motion was normal; there were no regional wall motionabnormalities. Features are consistent with a pseudonormal leftventricular filling pattern, with concomitant abnormal relaxationand increased filling pressure (grade 2 diastolic dysfunction). Doppler parameters are consistent with both elevated ventricular end-diastolic filling pressure and elevated left atrial fillingpressure. - Mitral valve: Calcified annulus. - Left atrium: The atrium was mildly dilated.   Consults  [name] orthopedics, [name] cardiology   Medications  Scheduled Meds: . ALPRAZolam  0.25 mg Oral TID  . aspirin EC  81 mg Oral Daily  . atorvastatin  40 mg Oral Daily  . B-complex with vitamin C  1 tablet Oral Daily  . carvedilol  25 mg Oral Q breakfast  . carvedilol  6.25 mg Oral QPM  . ceFEPime (MAXIPIME) IV  1 g Intravenous Q8H  . clopidogrel  75 mg Oral Daily  . docusate sodium  200 mg Oral BID  . ezetimibe  10 mg Oral q morning - 10a  . feeding supplement (GLUCERNA SHAKE)  237 mL Oral BID BM  .  fentaNYL  50 mcg Transdermal Q72H  . furosemide  80 mg Oral Daily  . heparin  5,000 Units Subcutaneous 3 times per day  . hydrALAZINE  25 mg Oral 3 times per day  . insulin aspart  0-15 Units Subcutaneous TID WC  . insulin aspart  0-5 Units Subcutaneous QHS  . insulin glargine  50 Units Subcutaneous QHS  . Linaclotide  145 mcg Oral Daily  . metroNIDAZOLE  500 mg Oral 3 times per day  . nitroGLYCERIN  0.5 inch Topical 4 times per day  . omega-3 acid ethyl esters  2,000 mg Oral BID  .  pantoprazole  20 mg Oral Daily  . polyethylene glycol  17 g Oral Daily  . pregabalin  100 mg Oral TID  . rOPINIRole  2 mg Oral QHS  . tamsulosin  0.4 mg Oral Daily  . vancomycin  1,000 mg Intravenous Q12H   Continuous Infusions:  PRN Meds:.guaiFENesin-dextromethorphan, hydrALAZINE, HYDROcodone-acetaminophen, HYDROmorphone (DILAUDID) injection, nitroGLYCERIN, ondansetron **OR** ondansetron (ZOFRAN) IV, ondansetron  DVT Prophylaxis   Heparin    Lab Results  Component Value Date   PLT 169 09/27/2014    Antibiotics     Anti-infectives    Start     Dose/Rate Route Frequency Ordered Stop   09/27/14 1400  ceFEPIme (MAXIPIME) 1 g in dextrose 5 % 50 mL IVPB     1 g100 mL/hr over 30 Minutes Intravenous Every 8 hours 09/27/14 0918     09/27/14 1000  metroNIDAZOLE (FLAGYL) tablet 500 mg     500 mg Oral 3 times per day 09/27/14 0911     09/27/14 1000  vancomycin (VANCOCIN) IVPB 1000 mg/200 mL premix     1,000 mg200 mL/hr over 60 Minutes Intravenous Every 12 hours 09/27/14 0918     09/26/14 1900  meropenem (MERREM) 1 g in sodium chloride 0.9 % 100 mL IVPB  Status:  Discontinued     1 g200 mL/hr over 30 Minutes Intravenous 3 times per day 09/26/14 1832 09/27/14 0918   09/26/14 1830  piperacillin-tazobactam (ZOSYN) IVPB 3.375 g  Status:  Discontinued     3.375 g12.5 mL/hr over 240 Minutes Intravenous 3 times per day 09/26/14 1806 09/26/14 1831   09/26/14 1830  vancomycin (VANCOCIN) 2,000 mg in sodium chloride 0.9 % 500 mL IVPB     2,000 mg250 mL/hr over 120 Minutes Intravenous  Once 09/26/14 1807 09/26/14 2018          Objective:   Filed Vitals:   09/27/14 0549 09/27/14 1300 09/27/14 2109 09/28/14 0500  BP: 142/56 140/67 141/67 120/52  Pulse: 55 51 52 51  Temp: 98.3 F (36.8 C) 97.8 F (36.6 C) 97.7 F (36.5 C) 98.1 F (36.7 C)  TempSrc: Oral Oral Oral Oral  Resp: 17 18 16 18   Height:      Weight: 113.263 kg (249 lb 11.2 oz)   115.214 kg (254 lb)  SpO2: 97% 98% 97% 96%    Wt  Readings from Last 3 Encounters:  09/28/14 115.214 kg (254 lb)  08/24/14 113.399 kg (250 lb)  08/17/14 113.399 kg (250 lb)     Intake/Output Summary (Last 24 hours) at 09/28/14 1105 Last data filed at 09/28/14 0944  Gross per 24 hour  Intake   1485 ml  Output    850 ml  Net    635 ml     Physical Exam  Awake Alert, Oriented X 3, No new F.N deficits, Normal affect [place].AT,PERRAL Supple Neck,No JVD, No cervical lymphadenopathy  appriciated.  Symmetrical Chest wall movement, Good air movement bilaterally, CTAB RRR,No Gallops,Rubs or new Murmurs, No Parasternal Heave +ve B.Sounds, Abd Soft, No tenderness, No organomegaly appriciated, No rebound - guarding or rigidity. No Cyanosis, Clubbing or edema, No new Rash or bruise , R foot in bandage  Picture from admission day     Data Review   Micro Results Recent Results (from the past 240 hour(s))  Wound culture     Status: None (Preliminary result)   Collection Time: 09/26/14  4:52 PM  Result Value Ref Range Status   Specimen Description FOOT  Final   Special Requests FOOT AMPUTATION  Final   Gram Stain   Final    NO WBC SEEN NO SQUAMOUS EPITHELIAL CELLS SEEN NO ORGANISMS SEEN Performed at [facility]    Culture   Final    ABUNDANT STAPHYLOCOCCUS AUREUS Note: RIFAMPIN AND GENTAMICIN SHOULD NOT BE USED AS SINGLE DRUGS FOR TREATMENT OF STAPH INFECTIONS. Performed at [facility]    Report Status PENDING  Incomplete  Blood culture (routine x 2)     Status: None (Preliminary result)   Collection Time: 09/26/14  5:52 PM  Result Value Ref Range Status   Specimen Description BLOOD LEFT HAND  Final   Special Requests BOTTLES DRAWN AEROBIC AND ANAEROBIC 5CC  Final   Culture   Final           BLOOD CULTURE RECEIVED NO GROWTH TO DATE CULTURE WILL BE HELD FOR 5 DAYS BEFORE ISSUING A FINAL NEGATIVE REPORT Performed at [facility]    Report Status PENDING  Incomplete  Blood culture (routine x 2)      Status: None (Preliminary result)   Collection Time: 09/26/14  5:57 PM  Result Value Ref Range Status   Specimen Description BLOOD RIGHT ARM  Final   Special Requests BOTTLES DRAWN AEROBIC AND ANAEROBIC 5CC  Final   Culture   Final           BLOOD CULTURE RECEIVED NO GROWTH TO DATE CULTURE WILL BE HELD FOR 5 DAYS BEFORE ISSUING A FINAL NEGATIVE REPORT Performed at [facility]    Report Status PENDING  Incomplete    Radiology Reports Dg Chest Port 1 View  09/26/2014   CLINICAL DATA:  Right foot infection. Amputation 4 weeks ago. Emphysema. Hypertension. Cough.  EXAM: PORTABLE CHEST - 1 VIEW  COMPARISON:  11/25/2012  FINDINGS: Gas beneath the left hemidiaphragm is presumably gastric although not entirely specific on today's semi erect chest view.  Mild atelectasis along the left hemidiaphragm. Low lung volumes. Thoracic spondylosis. Lower cervical plate and screw fixator.  Mild central airway thickening.  IMPRESSION: 1. Airway thickening is present, suggesting bronchitis or reactive airways disease. 2. Mild atelectasis along the left hemidiaphragm. 3. Thoracic spondylosis.   Electronically Signed   By: Sherryl Barters M.D.   On: 09/26/2014 20:34   Dg Foot Complete Right  09/26/2014   CLINICAL DATA:  Status post surgery for infection of the right foot 08/28/2015. Draining wound on the lateral aspect of the foot.  EXAM: RIGHT FOOT COMPLETE - 3+ VIEW  COMPARISON:  Plain films of the right foot 08/24/2014. MRI right foot 08/25/2014.  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 Bandaging is present over the wound. No soft tissue gas collection, radiopaque foreign body or bony destructive change is seen. Severe first MTP joint space narrowing and  bulky osteophytosis or heterotopic calcification about the first MTP joint is noted. Soft tissue swelling about the foot appears worsened compared to the prior  exams.  IMPRESSION: Negative for plain film evidence of osteomyelitis.  Marked soft tissue swelling about the foot.  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   Electronically Signed   By: Inge Rise M.D.   On: 09/26/2014 14:46     CBC  Recent Labs Lab 09/26/14 1405 09/27/14 0430  WBC 5.0 4.1  HGB 10.6* 10.8*  HCT 30.0* 30.7*  PLT 167 169  MCV 82.0 83.0  MCH 29.0 29.2  MCHC 35.3 35.2  RDW 13.0 13.4  LYMPHSABS 0.9  --   MONOABS 0.7  --   EOSABS 0.1  --   BASOSABS 0.0  --     Chemistries   Recent Labs Lab 09/26/14 1405 09/27/14 0430  NA 136 137  K 3.9 4.1  CL 101 101  CO2 25 31  GLUCOSE 265* 195*  BUN 21 21  CREATININE 1.28 1.26  CALCIUM 8.9 9.1  AST 35  --   ALT 32  --   ALKPHOS 119*  --   BILITOT 0.7  --    ------------------------------------------------------------------------------------------------------------------ estimated creatinine clearance is 84.1 mL/min (by C-G formula based on Cr of 1.26). ------------------------------------------------------------------------------------------------------------------  Recent Labs  09/26/14 1920  HGBA1C 6.5*   ------------------------------------------------------------------------------------------------------------------ No results for input(s): CHOL, HDL, LDLCALC, TRIG, CHOLHDL, LDLDIRECT in the last 72 hours. ------------------------------------------------------------------------------------------------------------------ No results for input(s): TSH, T4TOTAL, T3FREE, THYROIDAB in the last 72 hours.  Invalid input(s): FREET3 ------------------------------------------------------------------------------------------------------------------ No results for input(s): VITAMINB12, FOLATE, FERRITIN, TIBC, IRON, RETICCTPCT in the last 72 hours.  Coagulation profile No results for input(s): INR, PROTIME in the last 168 hours.  No  results for input(s): DDIMER in the last 72 hours.  Cardiac Enzymes  Recent Labs Lab 09/27/14 0839 09/27/14 1425 09/27/14 1957  TROPONINI <0.03 <0.03 <0.03   ------------------------------------------------------------------------------------------------------------------ Invalid input(s): POCBNP     Time Spent in minutes  35   Ianna Salmela K M.D on 09/28/2014 at 11:05 AM  Between 7am to 7pm - Pager - [phone]  After 7pm go to [email or web address] - [facility] Office  [phone]

## 2014-09-29 LAB — GLUCOSE, CAPILLARY
Glucose-Capillary: 194 mg/dL — ABNORMAL HIGH (ref 70–99)
Glucose-Capillary: 206 mg/dL — ABNORMAL HIGH (ref 70–99)
Glucose-Capillary: 189 mg/dL — ABNORMAL HIGH (ref 70–99)
Glucose-Capillary: 331 mg/dL — ABNORMAL HIGH (ref 70–99)

## 2014-09-29 LAB — CBC
HCT: 29.8 % — ABNORMAL LOW (ref 39.0–52.0)
Hemoglobin: 10.4 g/dL — ABNORMAL LOW (ref 13.0–17.0)
MCH: 29.1 pg (ref 26.0–34.0)
MCHC: 34.9 g/dL (ref 30.0–36.0)
MCV: 83.5 fL (ref 78.0–100.0)
Platelets: 176 10*3/uL (ref 150–400)
RBC: 3.57 MIL/uL — ABNORMAL LOW (ref 4.22–5.81)
RDW: 13.2 % (ref 11.5–15.5)
WBC: 5.7 10*3/uL (ref 4.0–10.5)

## 2014-09-29 LAB — BASIC METABOLIC PANEL
Anion gap: 10 (ref 5–15)
BUN: 16 mg/dL (ref 6–23)
CO2: 28 mmol/L (ref 19–32)
Calcium: 9 mg/dL (ref 8.4–10.5)
Chloride: 101 mEq/L (ref 96–112)
Creatinine, Ser: 1.11 mg/dL (ref 0.50–1.35)
GFR calc Af Amer: 82 mL/min — ABNORMAL LOW (ref >90)
GFR calc non Af Amer: 70 mL/min — ABNORMAL LOW (ref >90)
Glucose, Bld: 168 mg/dL — ABNORMAL HIGH (ref 70–99)
Potassium: 4 mmol/L (ref 3.5–5.1)
Sodium: 139 mmol/L (ref 135–145)

## 2014-09-29 LAB — WOUND CULTURE: Gram Stain: NONE SEEN

## 2014-09-29 LAB — VANCOMYCIN, TROUGH: Vancomycin Tr: 15.4 ug/mL (ref 10.0–20.0)

## 2014-09-29 MED ORDER — INSULIN GLARGINE 100 UNIT/ML ~~LOC~~ SOLN
60.0000 [IU] | Freq: Every day | SUBCUTANEOUS | Status: DC
  Administered 2014-09-29: 60 [IU] via SUBCUTANEOUS
  Filled 2014-09-29 (×2): qty 0.6

## 2014-09-29 MED ORDER — CEFAZOLIN SODIUM-DEXTROSE 2-3 GM-% IV SOLR
2.0000 g | Freq: Three times a day (TID) | INTRAVENOUS | Status: DC
  Administered 2014-09-29 – 2014-09-30 (×3): 2 g via INTRAVENOUS
  Filled 2014-09-29 (×6): qty 50

## 2014-09-29 MED ORDER — INSULIN ASPART 100 UNIT/ML ~~LOC~~ SOLN
3.0000 [IU] | Freq: Three times a day (TID) | SUBCUTANEOUS | Status: DC
  Administered 2014-09-29 – 2014-09-30 (×4): 3 [IU] via SUBCUTANEOUS

## 2014-09-29 NOTE — Progress Notes (Signed)
Patient Demographics  [name], is a 59 y.o. male, DOB - January 02, 1956, [id number]  Admit date - 09/26/2014   Admitting Physician Thurnell Lose, MD  Outpatient Primary MD for the patient is [name], MD  LOS - 3   Chief Complaint  Patient presents with  . Post-op Problem        Subjective:   [name] today has, No headache, no chest pain or pressure, No abdominal pain - No Nausea, No new weakness tingling or numbness, No Cough - SOB.    Assessment & Plan    1. Right foot postoperative wound infection with minimal dehiscence. Discussed with [name], trial of antibiotics for a few days and monitor wound closely. Wound cultures noted discussed with ID physician Dr. Graylon Good and switched to IV cefepime, if oral antibiotics are required and surgical resection is not an option he will be switched to Keflex 500 every 6 hrs.   2. DM type II. Check A1 C continue Lantus at sliding scale. Will add pre-meal NovoLog and increase Lantus dose for better control.  Lab Results  Component Value Date   HGBA1C 6.5* 09/26/2014    CBG (last 3)   Recent Labs  09/28/14 1719 09/28/14 2211 09/29/14 0728  GLUCAP 247* 287* 194*      3. PAD. Continue Plavix, aspirin and statin for secondary prevention.   4. Essential hypertension. Home dose Coreg will be continued along with home dose Lasix. Monitor BMP intake and output closely.   5. Dyslipidemia continue statin along with Zetia.   6. GERD/Barrett's esophagus. Continue PPI. Outpatient follow-up with Dr. Hilarie Fredrickson post discharge if needed.   7. Diabetic peripheral neuropathy. Lyrica, which will be continued.   8. BPH. Continue Flomax.   9. Chest pressure. Seen by cardiologist [name], EKG nonacute per [name] shows changes  suggested of LVH similar to previous office EKG. Troponin negative. Echo stable with EF of around 60% and no wall motion abnormality. Continue aspirin, Plavix, beta blocker and statin for secondary prevention.   10. Chr.Diastolic CHF EF [id number] on echo. Continue beta blocker, hydralazine and Lasix.     Code Status: Full  Family Communication: wife  Disposition Plan: Home   Procedures   Echogram.  - Left ventricle: The cavity size was normal. There was moderateconcentric hypertrophy. Systolic function was normal. Theestimated ejection fraction was in the range of 55% to 60%. Wall motion was normal; there were no regional wall motionabnormalities. Features are consistent with a pseudonormal leftventricular filling pattern, with concomitant abnormal relaxationand increased filling pressure (grade 2 diastolic dysfunction). Doppler parameters are consistent with both elevated ventricular end-diastolic filling pressure and elevated left atrial fillingpressure. - Mitral valve: Calcified annulus. - Left atrium: The atrium was mildly dilated.   Consults  [name] orthopedics, [name] cardiology, ID Dr. Graylon Good over phone   Medications  Scheduled Meds: . ALPRAZolam  0.25 mg Oral TID  . aspirin EC  81 mg Oral Daily  . atorvastatin  40 mg Oral Daily  . B-complex with vitamin C  1 tablet Oral Daily  . carvedilol  25 mg Oral Q breakfast  . carvedilol  6.25 mg Oral QPM  .  ceFAZolin (ANCEF) IV  2 g Intravenous 3 times  per day  . clopidogrel  75 mg Oral Daily  . docusate sodium  200 mg Oral BID  . ezetimibe  10 mg Oral q morning - 10a  . feeding supplement (GLUCERNA SHAKE)  237 mL Oral BID BM  . fentaNYL  50 mcg Transdermal Q72H  . furosemide  80 mg Oral Daily  . heparin  5,000 Units Subcutaneous 3 times per day  . hydrALAZINE  25 mg Oral 3 times per day  . insulin aspart  0-15 Units Subcutaneous TID WC  . insulin aspart  0-5 Units Subcutaneous QHS  . insulin glargine  50 Units  Subcutaneous QHS  . Linaclotide  145 mcg Oral Daily  . nitroGLYCERIN  0.5 inch Topical 4 times per day  . omega-3 acid ethyl esters  2,000 mg Oral BID  . pantoprazole  20 mg Oral Daily  . polyethylene glycol  17 g Oral Daily  . pregabalin  100 mg Oral TID  . rOPINIRole  2 mg Oral QHS  . tamsulosin  0.4 mg Oral Daily   Continuous Infusions:  PRN Meds:.guaiFENesin-dextromethorphan, hydrALAZINE, HYDROcodone-acetaminophen, HYDROmorphone (DILAUDID) injection, nitroGLYCERIN, ondansetron **OR** ondansetron (ZOFRAN) IV, ondansetron  DVT Prophylaxis   Heparin    Lab Results  Component Value Date   PLT 176 09/29/2014    Antibiotics     Anti-infectives    Start     Dose/Rate Route Frequency Ordered Stop   09/29/14 1400  ceFAZolin (ANCEF) IVPB 2 g/50 mL premix     2 g100 mL/hr over 30 Minutes Intravenous 3 times per day 09/29/14 1014     09/27/14 1400  ceFEPIme (MAXIPIME) 1 g in dextrose 5 % 50 mL IVPB  Status:  Discontinued     1 g100 mL/hr over 30 Minutes Intravenous Every 8 hours 09/27/14 0918 09/29/14 1014   09/27/14 1000  metroNIDAZOLE (FLAGYL) tablet 500 mg  Status:  Discontinued     500 mg Oral 3 times per day 09/27/14 0911 09/29/14 1014   09/27/14 1000  vancomycin (VANCOCIN) IVPB 1000 mg/200 mL premix  Status:  Discontinued     1,000 mg200 mL/hr over 60 Minutes Intravenous Every 12 hours 09/27/14 0918 09/29/14 1014   09/26/14 1900  meropenem (MERREM) 1 g in sodium chloride 0.9 % 100 mL IVPB  Status:  Discontinued     1 g200 mL/hr over 30 Minutes Intravenous 3 times per day 09/26/14 1832 09/27/14 0918   09/26/14 1830  piperacillin-tazobactam (ZOSYN) IVPB 3.375 g  Status:  Discontinued     3.375 g12.5 mL/hr over 240 Minutes Intravenous 3 times per day 09/26/14 1806 09/26/14 1831   09/26/14 1830  vancomycin (VANCOCIN) 2,000 mg in sodium chloride 0.9 % 500 mL IVPB     2,000 mg250 mL/hr over 120 Minutes Intravenous  Once 09/26/14 1807 09/26/14 2018          Objective:   Filed  Vitals:   09/28/14 0500 09/28/14 1500 09/28/14 2158 09/29/14 0532  BP: 120/52 117/63 139/56 151/60  Pulse: 51 53 66 61  Temp: 98.1 F (36.7 C) 98.6 F (37 C) 98.1 F (36.7 C) 98.3 F (36.8 C)  TempSrc: Oral Oral Oral   Resp: 18 18  18   Height:      Weight: 115.214 kg (254 lb)   115.577 kg (254 lb 12.8 oz)  SpO2: 96% 97% 97% 98%    Wt Readings from Last 3 Encounters:  09/29/14 115.577 kg (254 lb 12.8 oz)  08/24/14 113.399 kg (250 lb)  08/17/14 113.399 kg (  250 lb)     Intake/Output Summary (Last 24 hours) at 09/29/14 1015 Last data filed at 09/29/14 1000  Gross per 24 hour  Intake    550 ml  Output    575 ml  Net    -25 ml     Physical Exam  Awake Alert, Oriented X 3, No new F.N deficits, Normal affect [place].AT,PERRAL Supple Neck,No JVD, No cervical lymphadenopathy appriciated.  Symmetrical Chest wall movement, Good air movement bilaterally, CTAB RRR,No Gallops,Rubs or new Murmurs, No Parasternal Heave +ve B.Sounds, Abd Soft, No tenderness, No organomegaly appriciated, No rebound - guarding or rigidity. No Cyanosis, Clubbing or edema, No new Rash or bruise , R foot in bandage   Picture from admission day     Data Review   Micro Results Recent Results (from the past 240 hour(s))  Wound culture     Status: None   Collection Time: 09/26/14  4:52 PM  Result Value Ref Range Status   Specimen Description FOOT  Final   Special Requests FOOT AMPUTATION  Final   Gram Stain   Final    NO WBC SEEN NO SQUAMOUS EPITHELIAL CELLS SEEN NO ORGANISMS SEEN Performed at [facility]    Culture   Final    ABUNDANT STAPHYLOCOCCUS AUREUS Note: RIFAMPIN AND GENTAMICIN SHOULD NOT BE USED AS SINGLE DRUGS FOR TREATMENT OF STAPH INFECTIONS. This organism is presumed to be Clindamycin resistant based on detection of inducible Clindamycin resistance. Performed at [facility]    Report Status 09/29/2014 FINAL  Final   Organism ID, Bacteria STAPHYLOCOCCUS AUREUS   Final      Susceptibility   Staphylococcus aureus - MIC*    CLINDAMYCIN RESISTANT      ERYTHROMYCIN >=8 RESISTANT Resistant     GENTAMICIN <=0.5 SENSITIVE Sensitive     LEVOFLOXACIN 0.25 SENSITIVE Sensitive     OXACILLIN 0.5 SENSITIVE Sensitive     PENICILLIN >=0.5 RESISTANT Resistant     RIFAMPIN <=0.5 SENSITIVE Sensitive     TRIMETH/SULFA 20 SENSITIVE Sensitive     VANCOMYCIN 1 SENSITIVE Sensitive     TETRACYCLINE >=16 RESISTANT Resistant     MOXIFLOXACIN <=0.25 SENSITIVE Sensitive     * ABUNDANT STAPHYLOCOCCUS AUREUS  Blood culture (routine x 2)     Status: None (Preliminary result)   Collection Time: 09/26/14  5:52 PM  Result Value Ref Range Status   Specimen Description BLOOD LEFT HAND  Final   Special Requests BOTTLES DRAWN AEROBIC AND ANAEROBIC 5CC  Final   Culture   Final           BLOOD CULTURE RECEIVED NO GROWTH TO DATE CULTURE WILL BE HELD FOR 5 DAYS BEFORE ISSUING A FINAL NEGATIVE REPORT Performed at [facility]    Report Status PENDING  Incomplete  Blood culture (routine x 2)     Status: None (Preliminary result)   Collection Time: 09/26/14  5:57 PM  Result Value Ref Range Status   Specimen Description BLOOD RIGHT ARM  Final   Special Requests BOTTLES DRAWN AEROBIC AND ANAEROBIC 5CC  Final   Culture   Final           BLOOD CULTURE RECEIVED NO GROWTH TO DATE CULTURE WILL BE HELD FOR 5 DAYS BEFORE ISSUING A FINAL NEGATIVE REPORT Performed at [facility]    Report Status PENDING  Incomplete    Radiology Reports Dg Chest Port 1 View  09/26/2014   CLINICAL DATA:  Right foot infection. Amputation 4 weeks  ago. Emphysema. Hypertension. Cough.  EXAM: PORTABLE CHEST - 1 VIEW  COMPARISON:  11/25/2012  FINDINGS: Gas beneath the left hemidiaphragm is presumably gastric although not entirely specific on today's semi erect chest view.  Mild atelectasis along the left hemidiaphragm. Low lung volumes. Thoracic spondylosis. Lower cervical plate and screw fixator.   Mild central airway thickening.  IMPRESSION: 1. Airway thickening is present, suggesting bronchitis or reactive airways disease. 2. Mild atelectasis along the left hemidiaphragm. 3. Thoracic spondylosis.   Electronically Signed   By: Sherryl Barters M.D.   On: 09/26/2014 20:34   Dg Foot Complete Right  09/26/2014   CLINICAL DATA:  Status post surgery for infection of the right foot 08/28/2015. Draining wound on the lateral aspect of the foot.  EXAM: RIGHT FOOT COMPLETE - 3+ VIEW  COMPARISON:  Plain films of the right foot 08/24/2014. MRI right foot 08/25/2014.  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 Bandaging is present over the wound. No soft tissue gas collection, radiopaque foreign body or bony destructive change is seen. Severe first MTP joint space narrowing and bulky osteophytosis or heterotopic calcification about the first MTP joint is noted. Soft tissue swelling about the foot appears worsened compared to the prior exams.  IMPRESSION: Negative for plain film evidence of osteomyelitis.  Marked soft tissue swelling about the foot.  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   Electronically Signed   By: Inge Rise M.D.   On: 09/26/2014 14:46     CBC  Recent Labs Lab 09/26/14 1405 09/27/14 0430 09/29/14 0454  WBC 5.0 4.1 5.7  HGB 10.6* 10.8* 10.4*  HCT 30.0* 30.7* 29.8*  PLT 167 169 176  MCV 82.0 83.0 83.5  MCH 29.0 29.2 29.1  MCHC 35.3 35.2 34.9  RDW 13.0 13.4 13.2  LYMPHSABS 0.9  --   --   MONOABS 0.7  --   --   EOSABS 0.1  --   --   BASOSABS 0.0  --   --     Chemistries   Recent Labs Lab 09/26/14 1405 09/27/14 0430 09/29/14 0454  NA 136 137 139  K 3.9 4.1 4.0  CL 101 101 101  CO2 25 31 28   GLUCOSE 265* 195* 168*  BUN 21 21 16   CREATININE 1.28 1.26 1.11  CALCIUM 8.9 9.1 9.0  AST 35  --   --     ALT 32  --   --   ALKPHOS 119*  --   --   BILITOT 0.7  --   --    ------------------------------------------------------------------------------------------------------------------ estimated creatinine clearance is 95.7 mL/min (by C-G formula based on Cr of 1.11). ------------------------------------------------------------------------------------------------------------------  Recent Labs  09/26/14 1920  HGBA1C 6.5*   ------------------------------------------------------------------------------------------------------------------ No results for input(s): CHOL, HDL, LDLCALC, TRIG, CHOLHDL, LDLDIRECT in the last 72 hours. ------------------------------------------------------------------------------------------------------------------ No results for input(s): TSH, T4TOTAL, T3FREE, THYROIDAB in the last 72 hours.  Invalid input(s): FREET3 ------------------------------------------------------------------------------------------------------------------ No results for input(s): VITAMINB12, FOLATE, FERRITIN, TIBC, IRON, RETICCTPCT in the last 72 hours.  Coagulation profile No results for input(s): INR, PROTIME in the last 168 hours.  No results for input(s): DDIMER in the last 72 hours.  Cardiac Enzymes  Recent Labs Lab 09/27/14 0839 09/27/14 1425 09/27/14 1957  TROPONINI <0.03 <0.03 <0.03   ------------------------------------------------------------------------------------------------------------------ Invalid input(s): POCBNP     Time Spent in minutes  68   Lala Lund K M.D on 09/29/2014 at 10:15 AM  Between 7am to 7pm - Pager - [phone]  After 7pm go to [email or web address] - [facility] Office  [phone]

## 2014-09-30 ENCOUNTER — Inpatient Hospital Stay (HOSPITAL_COMMUNITY): Payer: BLUE CROSS/BLUE SHIELD

## 2014-09-30 LAB — GLUCOSE, CAPILLARY
Glucose-Capillary: 96 mg/dL (ref 70–99)
Glucose-Capillary: 139 mg/dL — ABNORMAL HIGH (ref 70–99)

## 2014-09-30 MED ORDER — OXYCODONE-ACETAMINOPHEN 5-325 MG PO TABS: 1.0000 | ORAL_TABLET | Freq: Three times a day (TID) | ORAL | Status: DC | PRN

## 2014-09-30 MED ORDER — DOCUSATE SODIUM 100 MG PO CAPS: 200.0000 mg | ORAL_CAPSULE | Freq: Two times a day (BID) | ORAL | Status: DC | PRN

## 2014-09-30 MED ORDER — CEPHALEXIN 500 MG PO CAPS: 500.0000 mg | ORAL_CAPSULE | Freq: Four times a day (QID) | ORAL | Status: DC

## 2014-09-30 MED ORDER — ISOSORBIDE MONONITRATE ER 30 MG PO TB24: 30.0000 mg | ORAL_TABLET | Freq: Every day | ORAL | Status: DC

## 2014-09-30 NOTE — Discharge Summary (Signed)
[name], is a 59 y.o. male  DOB 12-15-1955  MRN [id number].  Admission date:  09/26/2014  Admitting Physician  Thurnell Lose, MD  Discharge Date:  09/30/2014   Primary MD  [name], MD  Recommendations for primary care physician for things to follow:   CBC, BMP in a week. Monitor right foot wound closely   Admission Diagnosis  Cough [R05] Post op infection [T81.4XXA] Wound infection after surgery, initial encounter [T81.4XXA]   Discharge Diagnosis  Cough [R05] Post op infection [T81.4XXA] Wound infection after surgery, initial encounter [T81.4XXA]    Principal Problem:   Cellulitis of foot, right Active Problems:   PVD (peripheral vascular disease)   Tobacco abuse   Osteomyelitis of toe of right foot   Diabetes mellitus type 2, controlled   Essential hypertension   Hyperlipidemia   Wound infection   Cellulitis of right foot      Past Medical History  Diagnosis Date  . Carotid artery occlusion 11/10/10    LEFT CAROTID ENDARTERECTOMY  . Diabetes mellitus   . Hypertension   . Hyperlipidemia   . COPD (chronic obstructive pulmonary disease)   . DJD (degenerative joint disease)   . GERD (gastroesophageal reflux disease)   . Substernal chest pain   . 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 Slurred speech     AS PER WIFE IN D/C NOTE 11/10/10  . PAD (peripheral artery disease)     Distal aortogram June 2012. Atherectomy left popliteal artery July 2012.   . Barrett's esophagus   . Fatty liver   . Diverticulosis of colon (without mention of hemorrhage)   . Emphysema of lung   . Osteomyelitis   . Complication of anesthesia     BP WENT UP AT [facility] "  . Sleep apnea     has not used CPAP in e years - took self off  . H/O hiatal hernia   . Neuromuscular disorder     peripheral  neuropathy  . Full dentures   . Wears glasses     Past Surgical History  Procedure Laterality Date  . Laminectomy      X 3 LUMBAR AND X 2 CERVICAL SPINE OPERATIONS  . Carotid endarterectomy  11/10/10  . Hernia repair      LEFT INGUINAL AND UMBILICAL REPAIRS  . Anterior fusion cervical spine  02/06/06    C4-5, C5-6, C6-7; SURGEON DR. MAX COHEN  . Penile prosthesis implant  08/14/05    INFRAPUBIC INSERTION OF INFLATABLE PENILE PROSTHESIS; SURGEON DR. Amalia Hailey  . Laparoscopic cholecystectomy w/ cholangiography  11/09/04    SURGEON DR. Luella Cook  . Total knee arthroplasty  07/2002    RIGHT KNEE ; SURGEON  DR. [name] ALSO HAD ARTHROSCOPIC RIGHT KNEE IN  10/2001  . Shoulder arthroscopy    . Cholecystectomy    . Amputation  11/05/2011    Procedure: AMPUTATION RAY;  Surgeon: Wylene Simmer, MD;  Location: [place];  Service: Orthopedics;  Laterality: Right;  Amputation of Right 4&5th Toes  . Femoral artery stent      x6  . Amputation Left 11/26/2012    Procedure: AMPUTATION RAY;  Surgeon: Wylene Simmer, MD;  Location: [place];  Service: Orthopedics;  Laterality: Left;  fourth ray amputation  . Cardiac catheterization  10/31/04    2009  . Carpal tunnel release Right 10/21/2013    Procedure: RIGHT CARPAL TUNNEL RELEASE;  Surgeon: Wynonia Sours, MD;  Location: [place];  Service: Orthopedics;  Laterality: Right;  . Ulnar nerve transposition Right 10/21/2013    Procedure: RIGHT ELBOW  ULNAR NERVE DECOMPRESSION;  Surgeon: Wynonia Sours, MD;  Location: [place];  Service: Orthopedics;  Laterality: Right;  . Joint replacement Right 2001    Total  . Spine surgery    . Neck surgery    . Esophageal manometry Bilateral 07/19/2014    Procedure: ESOPHAGEAL MANOMETRY (EM);  Surgeon: Jerene Bears, MD;  Location: [facility];  Service: Gastroenterology;  Laterality: Bilateral;  . Lower extremity angiogram N/A 03/19/2012    Procedure: LOWER EXTREMITY ANGIOGRAM;  Surgeon: Burnell Blanks, MD;  Location: [facility];  Service: Cardiovascular;  Laterality: N/A;  . Amputation Right 08/27/2014    Procedure: Transmetatarsal Amputation;  Surgeon: Newt Minion, MD;  Location: [place];  Service: Orthopedics;  Laterality: Right;       History of present illness and  Hospital Course:     Kindly see H&P for history of present illness and admission details, please review complete Labs, Consult reports and Test reports for all details in brief  HPI  from the history and physical done on the day of admission  [name] is a 59 y.o. male, with history of PAD status post bilateral lower extremity stenting 2 years ago at [facility], left carotid endarterectomy in 2012, type 2 diabetes mellitus non-insulin-dependent, hypertension, dyslipidemia, COPD, GERD, Barrett's esophagus, who underwent right fourth and fifth toe amputation a few years ago by [name], about one month ago he had an exposed bone with a small ulcer in the right foot and underwent transmetatarsal resection by [name], he went home and was doing well. He had sutures removed 2 weeks ago. Since yesterday he started experiencing pain at the postop site, he also noticed some drainage, came to the ER where he was diagnosed with dehiscence of his postop wound with some puslike drainage. Orthopedics was consulted and hospitalist was requested to admit the patient. Patient has no fever chills or systemic symptoms.  Hospital Course    1. Right foot postoperative wound infection with minimal dehiscence. Discussed with [name], trial of antibiotics for a few days and monitor wound closely. Wound cultures noted discussed with ID physician Dr. Graylon Good and switched to IV cefepime, she recommended oral option of Keflex 500 every 6 hrs. Have discussed with [name] this morning cleared for home discharge with outpatient follow-up with him. We'll defer duration of antibiotic per [name]. Also requested patient to follow with ID 1  time.   2. DM type II. A1c 6.5 continue home regimen.   3. PAD. Continue Plavix, aspirin and statin for secondary prevention.   4. Essential hypertension. Home dose Coreg will be continued along with home dose Lasix and Azor.    5. Dyslipidemia continue statin along with Zetia.   6. GERD/Barrett's esophagus. Continue PPI. Outpatient follow-up with Dr. Hilarie Fredrickson post discharge if needed.   7. Diabetic peripheral neuropathy.  Lyrica, which will be continued.   8. BPH. Continue Flomax.   9. Chest pressure. Seen by cardiologist [name], EKG nonacute per [name] shows changes suggested of LVH similar to previous office EKG. Troponin negative. Echo stable with EF of around 60% and no wall motion abnormality. Continue aspirin, Plavix, beta blocker and statin for secondary prevention. Follow with primary cardiologist post discharge.   10. Chr.Diastolic CHF EF [id number] on echo. Continue beta blocker, Azor and Lasix.       Discharge Condition: Stable   Follow UP  Follow-up Information    Follow up with [name], MD. Schedule an appointment as soon as possible for a visit in 3 days.   Specialty:  Endocrinology   Contact information:   [address] [place] [address] [phone]       Follow up with Laverda Page, MD. Schedule an appointment as soon as possible for a visit in 1 week.   Specialty:  Cardiology   Contact information:   [address] [place] [address] [phone]       Follow up with Newt Minion, MD. Schedule an appointment as soon as possible for a visit in 1 week.   Specialty:  Orthopedic Surgery   Contact information:   [place] [address] [phone]       Follow up with Carlyle Basques, MD. Schedule an appointment as soon as possible for a visit in 1 week.   Specialty:  Infectious Diseases   Contact information:   [place]  [address] [phone]         Discharge Instructions  and  Discharge Medications      Discharge Instructions    Discharge instructions    Complete by:  As directed   Follow with Primary MD [name], MD in 7 days   Keep Left foot clean and dry at all times  Get CBC, CMP, 2 view Chest X ray checked  by Primary MD next visit.    Activity: As tolerated with Full fall precautions use walker/cane & assistance as needed   Disposition Home     Diet: Heart Healthy Low carb.  For Heart failure patients - Check your Weight same time everyday, if you gain over 2 pounds, or you develop in leg swelling, experience more shortness of breath or chest pain, call your Primary MD immediately. Follow Cardiac Low Salt Diet and 1.8 lit/day fluid restriction.   On your next visit with your primary care physician please Get Medicines reviewed and adjusted.   Please request your Prim.MD to go over all Hospital Tests and Procedure/Radiological results at the follow up, please get all Hospital records sent to your Prim MD by signing hospital release before you go home.   If you experience worsening of your admission symptoms, develop shortness of breath, life threatening emergency, suicidal or homicidal thoughts you must seek medical attention immediately by calling 911 or calling your MD immediately  if symptoms less severe.  You Must read complete instructions/literature along with all the possible adverse reactions/side effects for all the Medicines you take and that have been prescribed to you. Take any new Medicines after you have completely understood and accpet all the possible adverse reactions/side effects.   Do not drive, operating heavy machinery, perform activities at heights, swimming or participation in water activities or provide baby sitting services if your were admitted for syncope or siezures until you have  seen by Primary MD or a Neurologist and advised to do so again.  Do  not drive when taking Pain medications.    Do not take more than prescribed Pain, Sleep and Anxiety Medications  Special Instructions: If you have smoked or chewed Tobacco  in the last 2 yrs please stop smoking, stop any regular Alcohol  and or any Recreational drug use.  Wear Seat belts while driving.   Please note  You were cared for by a hospitalist during your hospital stay. If you have any questions about your discharge medications or the care you received while you were in the hospital after you are discharged, you can call the unit and asked to speak with the hospitalist on call if the hospitalist that took care of you is not available. Once you are discharged, your primary care physician will handle any further medical issues. Please note that NO REFILLS for any discharge medications will be authorized once you are discharged, as it is imperative that you return to your primary care physician (or establish a relationship with a primary care physician if you do not have one) for your aftercare needs so that they can reassess your need for medications and monitor your lab values.     Increase activity slowly    Complete by:  As directed             Medication List    TAKE these medications        ALPRAZolam 0.5 MG tablet  Commonly known as:  XANAX  Take 0.25 mg by mouth 3 (three) times daily.     aspirin EC 81 MG tablet  Take 81 mg by mouth daily.     atorvastatin 40 MG tablet  Commonly known as:  LIPITOR  Take 40 mg by mouth daily.     AZOR 5-20 MG per tablet  Generic drug:  amLODipine-olmesartan  Take 1 tablet by mouth daily.     B-complex with vitamin C tablet  Take 1 tablet by mouth daily.     carvedilol 6.25 MG tablet  Commonly known as:  COREG  Take 6.25 mg by mouth every evening.     carvedilol 25 MG tablet  Commonly known as:  COREG  Take 25 mg by mouth every morning.     cephALEXin 500 MG capsule  Commonly known as:  KEFLEX  Take 1 capsule (500 mg  total) by mouth 4 (four) times daily.     clopidogrel 75 MG tablet  Commonly known as:  PLAVIX  Take 75 mg by mouth daily. 1 tab daily     docusate sodium 100 MG capsule  Commonly known as:  COLACE  Take 2 capsules (200 mg total) by mouth 2 (two) times daily as needed for mild constipation.     ezetimibe 10 MG tablet  Commonly known as:  ZETIA  Take 10 mg by mouth every morning.     fentaNYL 50 MCG/HR  Commonly known as:  DURAGESIC - dosed mcg/hr  Place 1 patch onto the skin every 3 (three) days.     furosemide 80 MG tablet  Commonly known as:  LASIX  Take 80 mg by mouth daily.     glyBURIDE-metformin 2.5-500 MG per tablet  Commonly known as:  GLUCOVANCE  Take 2 tablets by mouth 2 (two) times daily.     HYDROmorphone 2 MG tablet  Commonly known as:  DILAUDID  Take 1 tablet (2 mg total) by mouth every 4 (four) hours as  needed for pain.     insulin glargine 100 UNIT/ML injection  Commonly known as:  LANTUS  Inject 50 Units into the skin at bedtime.     insulin glulisine 100 UNIT/ML injection  Commonly known as:  APIDRA  Inject 10-20 Units into the skin 3 (three) times daily before meals. 10 breakfast 10 lunch and 20 supper     isosorbide mononitrate 30 MG 24 hr tablet  Commonly known as:  IMDUR  Take 1 tablet (30 mg total) by mouth daily.     lansoprazole 30 MG capsule  Commonly known as:  PREVACID  Take 30 mg by mouth 2 (two) times daily.     Linaclotide 145 MCG Caps capsule  Commonly known as:  LINZESS  Take 1 capsule (145 mcg total) by mouth daily.     meloxicam 15 MG tablet  Commonly known as:  MOBIC  Take 15 mg by mouth daily.     multivitamin tablet  Take 1 tablet by mouth daily.     nitroGLYCERIN 0.4 MG SL tablet  Commonly known as:  NITROSTAT  Place 1 tablet (0.4 mg total) under the tongue every 5 (five) minutes as needed (as needed for esophageal spasm).     omega-3 acid ethyl esters 1 G capsule  Commonly known as:  LOVAZA  Take 2,000 mg by mouth  2 (two) times daily.     ondansetron 4 MG disintegrating tablet  Commonly known as:  ZOFRAN-ODT  Take 4 mg by mouth 4 (four) times daily as needed for nausea.     oxyCODONE-acetaminophen 5-325 MG per tablet  Commonly known as:  PERCOCET/ROXICET  Take 1 tablet by mouth every 8 (eight) hours as needed.     pregabalin 100 MG capsule  Commonly known as:  LYRICA  Take 100 mg by mouth 3 (three) times daily.     rOPINIRole 2 MG tablet  Commonly known as:  REQUIP  Take 2 mg by mouth at bedtime.     tamsulosin 0.4 MG Caps capsule  Commonly known as:  FLOMAX  Take 0.4 mg by mouth daily.          Diet and Activity recommendation: See Discharge Instructions above   Consults obtained - [name] orthopedics, [name] cardiology, ID Dr. Graylon Good over phone   Major procedures and Radiology Reports - PLEASE review detailed and final reports for all details, in brief -     Echogram.  - Left ventricle: The cavity size was normal. There was moderateconcentric hypertrophy. Systolic function was normal. Theestimated ejection fraction was in the range of 55% to 60%. Wall motion was normal; there were no regional wall motionabnormalities. Features are consistent with a pseudonormal leftventricular filling pattern, with concomitant abnormal relaxationand increased filling pressure (grade 2 diastolic dysfunction). Doppler parameters are consistent with both elevated ventricular end-diastolic filling pressure and elevated left atrial fillingpressure. - Mitral valve: Calcified annulus. - Left atrium: The atrium was mildly dilated.   Dg Chest Port 1 View  09/26/2014   CLINICAL DATA:  Right foot infection. Amputation 4 weeks ago. Emphysema. Hypertension. Cough.  EXAM: PORTABLE CHEST - 1 VIEW  COMPARISON:  11/25/2012  FINDINGS: Gas beneath the left hemidiaphragm is presumably gastric although not entirely specific on today's semi erect chest view.  Mild atelectasis along the left hemidiaphragm.  Low lung volumes. Thoracic spondylosis. Lower cervical plate and screw fixator.  Mild central airway thickening.  IMPRESSION: 1. Airway thickening is present, suggesting bronchitis or reactive airways disease. 2. Mild atelectasis  along the left hemidiaphragm. 3. Thoracic spondylosis.   Electronically Signed   By: Sherryl Barters M.D.   On: 09/26/2014 20:34   Dg Abd Portable 1v  09/30/2014   CLINICAL DATA:  Lower abdominal pain and nausea  EXAM: PORTABLE ABDOMEN - 1 VIEW  COMPARISON:  October 27, 2004  FINDINGS: There is moderate stool in the colon. The bowel gas pattern is unremarkable. No obstruction or free air is seen on this supine examination. There are surgical clips in the right upper quadrant. There is postoperative change in the lower lumbar and upper sacral regions.  IMPRESSION: Bowel gas pattern unremarkable.  Moderate stool in colon.   Electronically Signed   By: Lowella Grip M.D.   On: 09/30/2014 08:27   Dg Foot Complete Right  09/26/2014   CLINICAL DATA:  Status post surgery for infection of the right foot 08/28/2015. Draining wound on the lateral aspect of the foot.  EXAM: RIGHT FOOT COMPLETE - 3+ VIEW  COMPARISON:  Plain films of the right foot 08/24/2014. MRI right foot 08/25/2014.  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 Bandaging is present over the wound. No soft tissue gas collection, radiopaque foreign body or bony destructive change is seen. Severe first MTP joint space narrowing and bulky osteophytosis or heterotopic calcification about the first MTP joint is noted. Soft tissue swelling about the foot appears worsened compared to the prior exams.  IMPRESSION: Negative for plain film evidence of osteomyelitis.  Marked soft tissue swelling about the foot.  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   Electronically  Signed   By: Inge Rise M.D.   On: 09/26/2014 14:46    Micro Results      Recent Results (from the past 240 hour(s))  Wound culture     Status: None   Collection Time: 09/26/14  4:52 PM  Result Value Ref Range Status   Specimen Description FOOT  Final   Special Requests FOOT AMPUTATION  Final   Gram Stain   Final    NO WBC SEEN NO SQUAMOUS EPITHELIAL CELLS SEEN NO ORGANISMS SEEN Performed at [facility]    Culture   Final    ABUNDANT STAPHYLOCOCCUS AUREUS Note: RIFAMPIN AND GENTAMICIN SHOULD NOT BE USED AS SINGLE DRUGS FOR TREATMENT OF STAPH INFECTIONS. This organism is presumed to be Clindamycin resistant based on detection of inducible Clindamycin resistance. Performed at [facility]    Report Status 09/29/2014 FINAL  Final   Organism ID, Bacteria STAPHYLOCOCCUS AUREUS  Final      Susceptibility   Staphylococcus aureus - MIC*    CLINDAMYCIN RESISTANT      ERYTHROMYCIN >=8 RESISTANT Resistant     GENTAMICIN <=0.5 SENSITIVE Sensitive     LEVOFLOXACIN 0.25 SENSITIVE Sensitive     OXACILLIN 0.5 SENSITIVE Sensitive     PENICILLIN >=0.5 RESISTANT Resistant     RIFAMPIN <=0.5 SENSITIVE Sensitive     TRIMETH/SULFA 20 SENSITIVE Sensitive     VANCOMYCIN 1 SENSITIVE Sensitive     TETRACYCLINE >=16 RESISTANT Resistant     MOXIFLOXACIN <=0.25 SENSITIVE Sensitive     * ABUNDANT STAPHYLOCOCCUS AUREUS  Blood culture (routine x 2)     Status: None (Preliminary result)   Collection Time: 09/26/14  5:52 PM  Result Value Ref Range Status   Specimen Description BLOOD LEFT HAND  Final   Special Requests BOTTLES DRAWN AEROBIC AND ANAEROBIC 5CC  Final   Culture   Final           BLOOD CULTURE RECEIVED NO GROWTH TO DATE CULTURE WILL BE HELD FOR 5 DAYS BEFORE ISSUING A FINAL NEGATIVE REPORT Performed at [facility]    Report Status PENDING  Incomplete  Blood culture (routine x 2)     Status: None (Preliminary result)   Collection Time: 09/26/14  5:57 PM   Result Value Ref Range Status   Specimen Description BLOOD RIGHT ARM  Final   Special Requests BOTTLES DRAWN AEROBIC AND ANAEROBIC 5CC  Final   Culture   Final           BLOOD CULTURE RECEIVED NO GROWTH TO DATE CULTURE WILL BE HELD FOR 5 DAYS BEFORE ISSUING A FINAL NEGATIVE REPORT Performed at [facility]    Report Status PENDING  Incomplete       Today   Subjective:   [name] today has no headache,no chest abdominal pain,no new weakness tingling or numbness, feels much better wants to go home today.   Objective:   Blood pressure 103/59, pulse 64, temperature 98.4 F (36.9 C), temperature source Oral, resp. rate 17, height 6\' 2"  (1.88 m), weight 115.622 kg (254 lb 14.4 oz), SpO2 99 %.   Intake/Output Summary (Last 24 hours) at 09/30/14 0905 Last data filed at 09/29/14 1810  Gross per 24 hour  Intake    480 ml  Output    725 ml  Net   -245 ml    Exam Awake Alert, Oriented x 3, No new F.N deficits, Normal affect [place].AT,PERRAL Supple Neck,No JVD, No cervical lymphadenopathy appriciated.  Symmetrical Chest wall movement, Good air movement bilaterally, CTAB RRR,No Gallops,Rubs or new Murmurs, No Parasternal Heave +ve B.Sounds, Abd Soft, Non tender, No organomegaly appriciated, No rebound -guarding or rigidity. No Cyanosis, Clubbing or edema, No new Rash or bruise, R foot in bandage  Data Review   CBC w Diff: Lab Results  Component Value Date   WBC 5.7 09/29/2014   HGB 10.4* 09/29/2014   HCT 29.8* 09/29/2014   PLT 176 09/29/2014   LYMPHOPCT 18 09/26/2014   MONOPCT 13* 09/26/2014   EOSPCT 2 09/26/2014   BASOPCT 1 09/26/2014    CMP: Lab Results  Component Value Date   NA 139 09/29/2014   K 4.0 09/29/2014   CL 101 09/29/2014   CO2 28 09/29/2014   BUN 16 09/29/2014   CREATININE 1.11 09/29/2014   CREATININE 1.00 02/01/2012   PROT 6.4 09/26/2014   ALBUMIN 3.6 09/26/2014   BILITOT 0.7 09/26/2014   ALKPHOS 119* 09/26/2014   AST 35 09/26/2014   ALT  32 09/26/2014  .   Total Time in preparing paper work, data evaluation and todays exam - 35 minutes  Thurnell Lose M.D on 09/30/2014 at [place]  [phone]

## 2014-09-30 NOTE — Progress Notes (Signed)
Patient ID: [name], male   DOB: Nov 28, 1955, 59 y.o.   MRN: [id number] Patient's foot looks excellent this morning. There is no drainage there is good granulation tissue over the wound. There is no depth to the wound. Cellulitis has essentially resolved. Feel that is safe for patient be discharged on oral antibiotics I will follow-up in the office.

## 2014-09-30 NOTE — Discharge Instructions (Signed)
Follow with Primary MD [name], MD in 7 days   Keep Left foot clean and dry at all times  Get CBC, CMP, 2 view Chest X ray checked  by Primary MD next visit.    Activity: As tolerated with Full fall precautions use walker/cane & assistance as needed   Disposition Home     Diet: Heart Healthy Low carb.  For Heart failure patients - Check your Weight same time everyday, if you gain over 2 pounds, or you develop in leg swelling, experience more shortness of breath or chest pain, call your Primary MD immediately. Follow Cardiac Low Salt Diet and 1.8 lit/day fluid restriction.   On your next visit with your primary care physician please Get Medicines reviewed and adjusted.   Please request your Prim.MD to go over all Hospital Tests and Procedure/Radiological results at the follow up, please get all Hospital records sent to your Prim MD by signing hospital release before you go home.   If you experience worsening of your admission symptoms, develop shortness of breath, life threatening emergency, suicidal or homicidal thoughts you must seek medical attention immediately by calling 911 or calling your MD immediately  if symptoms less severe.  You Must read complete instructions/literature along with all the possible adverse reactions/side effects for all the Medicines you take and that have been prescribed to you. Take any new Medicines after you have completely understood and accpet all the possible adverse reactions/side effects.   Do not drive, operating heavy machinery, perform activities at heights, swimming or participation in water activities or provide baby sitting services if your were admitted for syncope or siezures until you have seen by Primary MD or a Neurologist and advised to do so again.  Do not drive when taking Pain medications.    Do not take more than prescribed Pain, Sleep and Anxiety Medications  Special Instructions: If you have smoked or chewed Tobacco   in the last 2 yrs please stop smoking, stop any regular Alcohol  and or any Recreational drug use.  Wear Seat belts while driving.   Please note  You were cared for by a hospitalist during your hospital stay. If you have any questions about your discharge medications or the care you received while you were in the hospital after you are discharged, you can call the unit and asked to speak with the hospitalist on call if the hospitalist that took care of you is not available. Once you are discharged, your primary care physician will handle any further medical issues. Please note that NO REFILLS for any discharge medications will be authorized once you are discharged, as it is imperative that you return to your primary care physician (or establish a relationship with a primary care physician if you do not have one) for your aftercare needs so that they can reassess your need for medications and monitor your lab values.

## 2014-10-03 LAB — CULTURE, BLOOD (ROUTINE X 2)
Culture: NO GROWTH
Culture: NO GROWTH

## 2014-10-05 ENCOUNTER — Encounter: Payer: Self-pay | Admitting: Infectious Diseases

## 2014-10-05 ENCOUNTER — Ambulatory Visit (INDEPENDENT_AMBULATORY_CARE_PROVIDER_SITE_OTHER): Payer: BLUE CROSS/BLUE SHIELD | Admitting: Infectious Diseases

## 2014-10-05 VITALS — BP 126/68 | HR 62 | Temp 98.8°F | Wt 249.0 lb

## 2014-10-05 DIAGNOSIS — T798XXA Other early complications of trauma, initial encounter: Secondary | ICD-10-CM

## 2014-10-05 DIAGNOSIS — E11621 Type 2 diabetes mellitus with foot ulcer: Secondary | ICD-10-CM | POA: Diagnosis not present

## 2014-10-05 DIAGNOSIS — L97519 Non-pressure chronic ulcer of other part of right foot with unspecified severity: Secondary | ICD-10-CM | POA: Diagnosis not present

## 2014-10-05 DIAGNOSIS — I739 Peripheral vascular disease, unspecified: Secondary | ICD-10-CM

## 2014-10-05 MED ORDER — CEPHALEXIN 500 MG PO CAPS: 500.0000 mg | ORAL_CAPSULE | Freq: Four times a day (QID) | ORAL | Status: DC

## 2014-10-05 NOTE — Assessment & Plan Note (Signed)
Will continue his keflex for 2 more weeks. If he continues to have problems, will send him for MRI and extend his anbx.  Greatly appreciate Dr Jess Barters f/u.

## 2014-10-05 NOTE — Assessment & Plan Note (Signed)
His DM appears to be fairly well controlled by his hx. He has multiple complications however. He will f/u with his PCP.

## 2014-10-05 NOTE — Assessment & Plan Note (Signed)
He had significant drop off in ABI done 09-27-14. Will defer to PCP to have him seen by vascular.

## 2014-10-05 NOTE — Progress Notes (Signed)
   Subjective:    Patient ID: [name], male    DOB: 01-16-1956, 59 y.o.   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. In [facility] 2015, he had exposed bone with a small ulcer in the right foot and underwent transmetatarsal resection by [name], he went home and was doing well. He had sutures removed in early January then 09-25-14 he developed pain and drainage at the postop site. In the ER he was diagnosed with dehiscence of his postop wound with some puslike drainage. He was admitted on 09-26-14. He was evaluated by orthopedics and was treated with vanco/cefepime/flagyl. He did not undergo surgery. He was discussed with ID over the phone and was recommended to go home on po keflex for 2 weeks. He had a wound Cx which grew MSSA. He had plain films which did not show osteo. He was d/c home on 09-30-14. He has not had f/c at home.  He still has some drainage from his foot- this has decreased from previous.  He has had no problems taking keflex.  FSG have been "pretty close to normal". Has quit drinking "drinks" (soda pop). drinking mostly water now.  Has f/u appt with Dr [name] on 10-07-14 Fhx/Sochx reviewed.   Has ophtho next month.  Has had abd pain, found to be impacted in hospital. Has GI eval pending.  Review of Systems  Constitutional: Negative for fever, chills, appetite change and unexpected weight change.  Gastrointestinal: Positive for abdominal pain and constipation. Negative for diarrhea.  Genitourinary: Negative for difficulty urinating.  Neurological: Positive for numbness.       Objective:   Physical Exam  Constitutional: He appears well-developed and well-nourished.  HENT:    Mouth/Throat: No oropharyngeal exudate.  Eyes: EOM are normal. Pupils are equal, round, and reactive to light.  Neck: Neck supple.  Cardiovascular: Normal rate, regular rhythm and normal heart sounds.   Pulmonary/Chest: Effort normal and breath sounds normal.  Abdominal: Soft. Bowel sounds are normal. He exhibits no distension. There is no tenderness.  Musculoskeletal:       Feet:  Lymphadenopathy:    He has no cervical adenopathy.          Assessment & Plan:

## 2014-10-19 ENCOUNTER — Other Ambulatory Visit: Payer: Self-pay | Admitting: *Deleted

## 2014-10-19 ENCOUNTER — Encounter: Payer: Self-pay | Admitting: Infectious Diseases

## 2014-10-19 ENCOUNTER — Ambulatory Visit (INDEPENDENT_AMBULATORY_CARE_PROVIDER_SITE_OTHER): Payer: BLUE CROSS/BLUE SHIELD | Admitting: Infectious Diseases

## 2014-10-19 ENCOUNTER — Ambulatory Visit: Payer: BLUE CROSS/BLUE SHIELD | Admitting: Infectious Diseases

## 2014-10-19 VITALS — BP 107/64 | HR 67 | Temp 97.3°F | Wt 250.0 lb

## 2014-10-19 DIAGNOSIS — T798XXA Other early complications of trauma, initial encounter: Secondary | ICD-10-CM | POA: Diagnosis not present

## 2014-10-19 DIAGNOSIS — E119 Type 2 diabetes mellitus without complications: Secondary | ICD-10-CM | POA: Diagnosis not present

## 2014-10-19 DIAGNOSIS — M869 Osteomyelitis, unspecified: Secondary | ICD-10-CM | POA: Diagnosis not present

## 2014-10-19 MED ORDER — CEPHALEXIN 500 MG PO CAPS: 500.0000 mg | ORAL_CAPSULE | Freq: Four times a day (QID) | ORAL | Status: DC

## 2014-10-19 NOTE — Assessment & Plan Note (Signed)
Will continue his keflex for 2 more weeks. His inflammatory markers were not particularly elevated last month.  He appears to be doing well. Will see him back in 1 month. Hold on repeat imaging at this point. If he has worsening, he will return sooner.

## 2014-10-19 NOTE — Progress Notes (Signed)
   Subjective:    Patient ID: [name], male    DOB: May 19, 1956, 59 y.o.   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. In [facility] 2015, he had exposed bone with a small ulcer in the right foot and underwent transmetatarsal resection by [name], he went home and was doing well. He had sutures removed in early January then 09-25-14 he developed pain and drainage at the postop site. In the ER he was diagnosed with dehiscence of his postop wound with some puslike drainage. He was admitted on 09-26-14. He was evaluated by orthopedics and was treated with vanco/cefepime/flagyl. He did not undergo surgery. He was discussed with ID over the phone and was recommended to go home on po keflex for 2 weeks. He had a wound Cx which grew MSSA. He had plain films which did not show osteo. He was d/c home on 09-30-14. States he has had chills (due to his DM). FSG have been 92-160. Still having some mild d/c from his wound, clear. No heat or erythema in LE. No problems with keflex.  Has f/u with Dr [name] next week.  Only puts wt on his heel. No pain.   Review of Systems  Constitutional: Negative for fever and chills.  Gastrointestinal: Negative for diarrhea and constipation.  Genitourinary: Negative for difficulty urinating.       Objective:   Physical Exam  Constitutional: He appears well-developed and well-nourished.  Musculoskeletal:       Feet:          Assessment & Plan:

## 2014-10-19 NOTE — Assessment & Plan Note (Signed)
Appears to have ok control. Encourage to stay on top of this.

## 2014-10-22 ENCOUNTER — Telehealth: Payer: Self-pay | Admitting: Infectious Diseases

## 2014-10-22 NOTE — Telephone Encounter (Signed)
Pt to have a cardiac cath. I encouraged him that this would not pose an infection risk related to his foot infection.

## 2014-10-28 DIAGNOSIS — E1159 Type 2 diabetes mellitus with other circulatory complications: Secondary | ICD-10-CM

## 2014-10-28 DIAGNOSIS — I209 Angina pectoris, unspecified: Secondary | ICD-10-CM

## 2014-10-29 ENCOUNTER — Ambulatory Visit (HOSPITAL_COMMUNITY)
Admission: RE | Admit: 2014-10-29 | Discharge: 2014-10-30 | Disposition: A | Discharge status: Home / Self Care | Payer: BLUE CROSS/BLUE SHIELD | Source: Ambulatory Visit | Attending: Cardiology | Admitting: Cardiology

## 2014-10-29 ENCOUNTER — Other Ambulatory Visit: Payer: Self-pay

## 2014-10-29 ENCOUNTER — Encounter (HOSPITAL_COMMUNITY)
Admission: RE | Disposition: A | Discharge status: Home / Self Care | Payer: Self-pay | Source: Ambulatory Visit | Attending: Cardiology

## 2014-10-29 DIAGNOSIS — Z9861 Coronary angioplasty status: Secondary | ICD-10-CM

## 2014-10-29 DIAGNOSIS — I25119 Atherosclerotic heart disease of native coronary artery with unspecified angina pectoris: Secondary | ICD-10-CM | POA: Diagnosis not present

## 2014-10-29 DIAGNOSIS — E11621 Type 2 diabetes mellitus with foot ulcer: Secondary | ICD-10-CM | POA: Insufficient documentation

## 2014-10-29 DIAGNOSIS — N182 Chronic kidney disease, stage 2 (mild): Secondary | ICD-10-CM | POA: Insufficient documentation

## 2014-10-29 DIAGNOSIS — E119 Type 2 diabetes mellitus without complications: Secondary | ICD-10-CM | POA: Diagnosis not present

## 2014-10-29 DIAGNOSIS — I129 Hypertensive chronic kidney disease with stage 1 through stage 4 chronic kidney disease, or unspecified chronic kidney disease: Secondary | ICD-10-CM | POA: Diagnosis not present

## 2014-10-29 DIAGNOSIS — E1122 Type 2 diabetes mellitus with diabetic chronic kidney disease: Secondary | ICD-10-CM | POA: Diagnosis not present

## 2014-10-29 DIAGNOSIS — I739 Peripheral vascular disease, unspecified: Secondary | ICD-10-CM | POA: Diagnosis not present

## 2014-10-29 DIAGNOSIS — L97529 Non-pressure chronic ulcer of other part of left foot with unspecified severity: Secondary | ICD-10-CM | POA: Insufficient documentation

## 2014-10-29 DIAGNOSIS — I209 Angina pectoris, unspecified: Secondary | ICD-10-CM

## 2014-10-29 DIAGNOSIS — E1159 Type 2 diabetes mellitus with other circulatory complications: Secondary | ICD-10-CM

## 2014-10-29 DIAGNOSIS — E785 Hyperlipidemia, unspecified: Secondary | ICD-10-CM | POA: Diagnosis not present

## 2014-10-29 DIAGNOSIS — I2510 Atherosclerotic heart disease of native coronary artery without angina pectoris: Secondary | ICD-10-CM | POA: Diagnosis present

## 2014-10-29 HISTORY — PX: PERCUTANEOUS CORONARY STENT INTERVENTION (PCI-S): SHX5485

## 2014-10-29 HISTORY — PX: LEFT HEART CATHETERIZATION WITH CORONARY ANGIOGRAM: SHX5451

## 2014-10-29 LAB — GLUCOSE, CAPILLARY
Glucose-Capillary: 180 mg/dL — ABNORMAL HIGH (ref 70–99)
Glucose-Capillary: 348 mg/dL — ABNORMAL HIGH (ref 70–99)
Glucose-Capillary: 302 mg/dL — ABNORMAL HIGH (ref 70–99)
Glucose-Capillary: 314 mg/dL — ABNORMAL HIGH (ref 70–99)
Glucose-Capillary: 250 mg/dL — ABNORMAL HIGH (ref 70–99)

## 2014-10-29 LAB — POCT ACTIVATED CLOTTING TIME
Activated Clotting Time: 183 seconds
Activated Clotting Time: 478 seconds

## 2014-10-29 SURGERY — LEFT HEART CATHETERIZATION WITH CORONARY ANGIOGRAM
Anesthesia: LOCAL | Site: Hand | Laterality: Right

## 2014-10-29 MED ORDER — ATORVASTATIN CALCIUM 40 MG PO TABS
40.0000 mg | ORAL_TABLET | Freq: Every day | ORAL | Status: DC
  Administered 2014-10-30: 40 mg via ORAL
  Filled 2014-10-29: qty 1

## 2014-10-29 MED ORDER — SODIUM CHLORIDE 0.9 % IJ SOLN
3.0000 mL | INTRAMUSCULAR | Status: DC | PRN
Start: 2014-10-29 — End: 2014-10-30

## 2014-10-29 MED ORDER — GLYBURIDE-METFORMIN 2.5-500 MG PO TABS: 2.0000 | ORAL_TABLET | Freq: Two times a day (BID) | ORAL | Status: DC

## 2014-10-29 MED ORDER — SODIUM CHLORIDE 0.9 % IV SOLN
0.2500 mg/kg/h | INTRAVENOUS | Status: DC
  Filled 2014-10-29 (×2): qty 250

## 2014-10-29 MED ORDER — SODIUM CHLORIDE 0.9 % IV SOLN: 250.0000 mL | INTRAVENOUS | Status: DC | PRN

## 2014-10-29 MED ORDER — ASPIRIN 81 MG PO CHEW
81.0000 mg | CHEWABLE_TABLET | ORAL | Status: AC
  Administered 2014-10-29: 81 mg via ORAL

## 2014-10-29 MED ORDER — ONDANSETRON 4 MG PO TBDP
4.0000 mg | ORAL_TABLET | Freq: Four times a day (QID) | ORAL | Status: DC | PRN
  Filled 2014-10-29: qty 1

## 2014-10-29 MED ORDER — ALPRAZOLAM 0.25 MG PO TABS
0.2500 mg | ORAL_TABLET | Freq: Three times a day (TID) | ORAL | Status: DC
  Administered 2014-10-29 – 2014-10-30 (×3): 0.25 mg via ORAL
  Filled 2014-10-29 (×3): qty 1

## 2014-10-29 MED ORDER — SODIUM CHLORIDE 0.9 % IJ SOLN: 3.0000 mL | Freq: Two times a day (BID) | INTRAMUSCULAR | Status: DC

## 2014-10-29 MED ORDER — DIPHENHYDRAMINE HCL 50 MG/ML IJ SOLN
25.0000 mg | INTRAMUSCULAR | Status: AC
  Administered 2014-10-29: 25 mg via INTRAVENOUS

## 2014-10-29 MED ORDER — ONDANSETRON HCL 4 MG/2ML IJ SOLN: 4.0000 mg | Freq: Four times a day (QID) | INTRAMUSCULAR | Status: DC | PRN

## 2014-10-29 MED ORDER — HEPARIN (PORCINE) IN NACL 2-0.9 UNIT/ML-% IJ SOLN
INTRAMUSCULAR | Status: AC
  Filled 2014-10-29: qty 1000

## 2014-10-29 MED ORDER — INSULIN GLARGINE 100 UNIT/ML ~~LOC~~ SOLN
50.0000 [IU] | Freq: Every day | SUBCUTANEOUS | Status: DC
Start: 2014-10-29 — End: 2014-10-30
  Administered 2014-10-29: 50 [IU] via SUBCUTANEOUS
  Filled 2014-10-29 (×2): qty 0.5

## 2014-10-29 MED ORDER — VERAPAMIL HCL 2.5 MG/ML IV SOLN
INTRAVENOUS | Status: AC
  Filled 2014-10-29: qty 2

## 2014-10-29 MED ORDER — LIDOCAINE HCL (PF) 1 % IJ SOLN
INTRAMUSCULAR | Status: AC
  Filled 2014-10-29: qty 30

## 2014-10-29 MED ORDER — IRBESARTAN 150 MG PO TABS
150.0000 mg | ORAL_TABLET | Freq: Every day | ORAL | Status: DC
  Administered 2014-10-29 – 2014-10-30 (×2): 150 mg via ORAL
  Filled 2014-10-29 (×3): qty 1

## 2014-10-29 MED ORDER — ASPIRIN EC 81 MG PO TBEC
81.0000 mg | DELAYED_RELEASE_TABLET | Freq: Every day | ORAL | Status: DC
  Filled 2014-10-29: qty 1

## 2014-10-29 MED ORDER — TAMSULOSIN HCL 0.4 MG PO CAPS
0.4000 mg | ORAL_CAPSULE | Freq: Every day | ORAL | Status: DC
  Filled 2014-10-29: qty 1

## 2014-10-29 MED ORDER — MIDAZOLAM HCL 2 MG/2ML IJ SOLN
INTRAMUSCULAR | Status: AC
  Filled 2014-10-29: qty 2

## 2014-10-29 MED ORDER — FAMOTIDINE IN NACL 20-0.9 MG/50ML-% IV SOLN
INTRAVENOUS | Status: AC
  Filled 2014-10-29: qty 50

## 2014-10-29 MED ORDER — INSULIN ASPART 100 UNIT/ML ~~LOC~~ SOLN
20.0000 [IU] | Freq: Every day | SUBCUTANEOUS | Status: DC
  Administered 2014-10-29: 20 [IU] via SUBCUTANEOUS

## 2014-10-29 MED ORDER — ASPIRIN 81 MG PO CHEW
CHEWABLE_TABLET | ORAL | Status: AC
  Filled 2014-10-29: qty 1

## 2014-10-29 MED ORDER — SODIUM CHLORIDE 0.9 % IJ SOLN: 3.0000 mL | INTRAMUSCULAR | Status: DC | PRN

## 2014-10-29 MED ORDER — METHYLPREDNISOLONE SODIUM SUCC 125 MG IJ SOLR
125.0000 mg | INTRAMUSCULAR | Status: AC
  Administered 2014-10-29: 125 mg via INTRAVENOUS

## 2014-10-29 MED ORDER — NITROGLYCERIN 1 MG/10 ML FOR IR/CATH LAB
INTRA_ARTERIAL | Status: AC
  Filled 2014-10-29: qty 10

## 2014-10-29 MED ORDER — DIPHENHYDRAMINE HCL 50 MG/ML IJ SOLN
INTRAMUSCULAR | Status: AC
  Filled 2014-10-29: qty 1

## 2014-10-29 MED ORDER — PANTOPRAZOLE SODIUM 20 MG PO TBEC
20.0000 mg | DELAYED_RELEASE_TABLET | Freq: Every day | ORAL | Status: DC
  Administered 2014-10-30: 09:00:00 20 mg via ORAL
  Filled 2014-10-29: qty 1

## 2014-10-29 MED ORDER — GLYBURIDE 5 MG PO TABS
5.0000 mg | ORAL_TABLET | Freq: Two times a day (BID) | ORAL | Status: DC
  Administered 2014-10-30: 5 mg via ORAL
  Filled 2014-10-29 (×3): qty 1

## 2014-10-29 MED ORDER — LINACLOTIDE 145 MCG PO CAPS
145.0000 ug | ORAL_CAPSULE | Freq: Every day | ORAL | Status: DC
  Administered 2014-10-30: 145 ug via ORAL
  Filled 2014-10-29 (×2): qty 1

## 2014-10-29 MED ORDER — CARVEDILOL 12.5 MG PO TABS
25.0000 mg | ORAL_TABLET | Freq: Every morning | ORAL | Status: DC
  Administered 2014-10-30: 08:00:00 25 mg via ORAL
  Filled 2014-10-29: qty 2

## 2014-10-29 MED ORDER — CEPHALEXIN 500 MG PO CAPS
500.0000 mg | ORAL_CAPSULE | Freq: Four times a day (QID) | ORAL | Status: DC
  Administered 2014-10-29 – 2014-10-30 (×4): 500 mg via ORAL
  Filled 2014-10-29 (×8): qty 1

## 2014-10-29 MED ORDER — CARVEDILOL 3.125 MG PO TABS
6.2500 mg | ORAL_TABLET | Freq: Every evening | ORAL | Status: DC
  Administered 2014-10-29: 21:00:00 6.25 mg via ORAL
  Filled 2014-10-29: qty 2

## 2014-10-29 MED ORDER — NITROGLYCERIN 0.4 MG SL SUBL: 0.4000 mg | SUBLINGUAL_TABLET | SUBLINGUAL | Status: DC | PRN

## 2014-10-29 MED ORDER — SODIUM CHLORIDE 0.9 % IV SOLN
Freq: Once | INTRAVENOUS | Status: AC
  Administered 2014-10-29: 07:00:00 via INTRAVENOUS

## 2014-10-29 MED ORDER — SODIUM CHLORIDE 0.9 % IV SOLN: 1.0000 mL/kg/h | INTRAVENOUS | Status: AC

## 2014-10-29 MED ORDER — FUROSEMIDE 80 MG PO TABS
80.0000 mg | ORAL_TABLET | Freq: Every day | ORAL | Status: DC
  Administered 2014-10-30: 80 mg via ORAL
  Filled 2014-10-29 (×2): qty 1

## 2014-10-29 MED ORDER — INSULIN ASPART 100 UNIT/ML ~~LOC~~ SOLN
10.0000 [IU] | Freq: Every day | SUBCUTANEOUS | Status: DC
  Administered 2014-10-29 – 2014-10-30 (×2): 10 [IU] via SUBCUTANEOUS

## 2014-10-29 MED ORDER — ISOSORBIDE MONONITRATE ER 60 MG PO TB24
120.0000 mg | ORAL_TABLET | Freq: Every day | ORAL | Status: DC
  Administered 2014-10-30: 120 mg via ORAL
  Filled 2014-10-29: qty 2

## 2014-10-29 MED ORDER — PREGABALIN 100 MG PO CAPS
100.0000 mg | ORAL_CAPSULE | Freq: Three times a day (TID) | ORAL | Status: DC
  Administered 2014-10-29 – 2014-10-30 (×3): 100 mg via ORAL
  Filled 2014-10-29 (×3): qty 1

## 2014-10-29 MED ORDER — HEPARIN SODIUM (PORCINE) 1000 UNIT/ML IJ SOLN
INTRAMUSCULAR | Status: AC
  Filled 2014-10-29: qty 1

## 2014-10-29 MED ORDER — HYDROMORPHONE HCL 2 MG PO TABS
2.0000 mg | ORAL_TABLET | ORAL | Status: DC | PRN
  Administered 2014-10-30: 07:00:00 2 mg via ORAL
  Filled 2014-10-29: qty 1

## 2014-10-29 MED ORDER — FENTANYL 50 MCG/HR TD PT72
50.0000 ug | MEDICATED_PATCH | TRANSDERMAL | Status: DC
  Administered 2014-10-29: 50 ug via TRANSDERMAL
  Filled 2014-10-29: qty 1

## 2014-10-29 MED ORDER — AMLODIPINE BESYLATE 5 MG PO TABS
5.0000 mg | ORAL_TABLET | Freq: Every day | ORAL | Status: DC
  Administered 2014-10-29 – 2014-10-30 (×2): 5 mg via ORAL
  Filled 2014-10-29 (×2): qty 1

## 2014-10-29 MED ORDER — OXYCODONE-ACETAMINOPHEN 5-325 MG PO TABS
1.0000 | ORAL_TABLET | Freq: Four times a day (QID) | ORAL | Status: DC | PRN
  Administered 2014-10-29 – 2014-10-30 (×4): 1 via ORAL
  Filled 2014-10-29 (×4): qty 1

## 2014-10-29 MED ORDER — CLOPIDOGREL BISULFATE 75 MG PO TABS
75.0000 mg | ORAL_TABLET | Freq: Every day | ORAL | Status: DC
  Administered 2014-10-29 – 2014-10-30 (×2): 75 mg via ORAL
  Filled 2014-10-29: qty 1

## 2014-10-29 MED ORDER — FAMOTIDINE IN NACL 20-0.9 MG/50ML-% IV SOLN
20.0000 mg | INTRAVENOUS | Status: AC
  Administered 2014-10-29: 20 mg via INTRAVENOUS

## 2014-10-29 MED ORDER — MELOXICAM 15 MG PO TABS
15.0000 mg | ORAL_TABLET | Freq: Every day | ORAL | Status: DC
  Administered 2014-10-29 – 2014-10-30 (×2): 15 mg via ORAL
  Filled 2014-10-29 (×2): qty 1

## 2014-10-29 MED ORDER — FENTANYL CITRATE 0.05 MG/ML IJ SOLN
INTRAMUSCULAR | Status: AC
  Filled 2014-10-29: qty 2

## 2014-10-29 MED ORDER — BIVALIRUDIN 250 MG IV SOLR
INTRAVENOUS | Status: AC
  Filled 2014-10-29: qty 250

## 2014-10-29 MED ORDER — ZOLPIDEM TARTRATE 5 MG PO TABS: 5.0000 mg | ORAL_TABLET | Freq: Every evening | ORAL | Status: DC | PRN

## 2014-10-29 MED ORDER — EZETIMIBE 10 MG PO TABS
10.0000 mg | ORAL_TABLET | Freq: Every morning | ORAL | Status: DC
  Administered 2014-10-29: 10 mg via ORAL
  Filled 2014-10-29 (×2): qty 1

## 2014-10-29 MED ORDER — METHYLPREDNISOLONE SODIUM SUCC 125 MG IJ SOLR
INTRAMUSCULAR | Status: AC
  Filled 2014-10-29: qty 2

## 2014-10-29 MED ORDER — B COMPLEX-C PO TABS
1.0000 | ORAL_TABLET | Freq: Every day | ORAL | Status: DC
Start: 2014-10-29 — End: 2014-10-30
  Administered 2014-10-29 – 2014-10-30 (×2): 1 via ORAL
  Filled 2014-10-29 (×2): qty 1

## 2014-10-29 MED ORDER — ACETAMINOPHEN 325 MG PO TABS: 650.0000 mg | ORAL_TABLET | ORAL | Status: DC | PRN

## 2014-10-29 MED ORDER — CLOPIDOGREL BISULFATE 75 MG PO TABS
ORAL_TABLET | ORAL | Status: AC
  Filled 2014-10-29: qty 1

## 2014-10-29 MED ORDER — SODIUM CHLORIDE 0.9 % IV SOLN: INTRAVENOUS | Status: DC

## 2014-10-29 MED ORDER — OMEGA-3-ACID ETHYL ESTERS 1 G PO CAPS
2000.0000 mg | ORAL_CAPSULE | Freq: Two times a day (BID) | ORAL | Status: DC
  Administered 2014-10-29 – 2014-10-30 (×2): 2000 mg via ORAL
  Filled 2014-10-29 (×3): qty 2

## 2014-10-29 MED ORDER — INSULIN GLULISINE 100 UNIT/ML ~~LOC~~ SOLN: 10.0000 [IU] | Freq: Three times a day (TID) | SUBCUTANEOUS | Status: DC

## 2014-10-29 MED ORDER — AMLODIPINE-OLMESARTAN 5-20 MG PO TABS: 1.0000 | ORAL_TABLET | Freq: Every day | ORAL | Status: DC

## 2014-10-29 MED ORDER — METFORMIN HCL 500 MG PO TABS
1000.0000 mg | ORAL_TABLET | Freq: Two times a day (BID) | ORAL | Status: DC
  Filled 2014-10-29 (×2): qty 2

## 2014-10-29 MED ORDER — INSULIN ASPART 100 UNIT/ML ~~LOC~~ SOLN
10.0000 [IU] | Freq: Once | SUBCUTANEOUS | Status: AC
  Administered 2014-10-29: 10 [IU] via SUBCUTANEOUS

## 2014-10-29 MED ORDER — ROPINIROLE HCL 1 MG PO TABS
2.0000 mg | ORAL_TABLET | Freq: Every day | ORAL | Status: DC
  Administered 2014-10-29: 2 mg via ORAL
  Filled 2014-10-29 (×2): qty 2

## 2014-10-29 NOTE — Interval H&P Note (Signed)
History and Physical Interval Note:  10/29/2014 7:45 AM  [name]  has presented today for surgery, with the diagnosis of abnormal stress test  The various methods of treatment have been discussed with the patient and family. After consideration of risks, benefits and other options for treatment, the patient has consented to  Procedure(s): LEFT HEART CATHETERIZATION WITH CORONARY ANGIOGRAM (N/A) and possible PCI as a surgical intervention .  The patient's history has been reviewed, patient examined, no change in status, stable for surgery.  I have reviewed the patient's chart and labs.  Questions were answered to the patient's satisfaction.   Cath Lab Visit (complete for each Cath Lab visit)  Clinical Evaluation Leading to the Procedure:   ACS: No.  Non-ACS:    Anginal Classification: CCS III  Anti-ischemic medical therapy: Maximal Therapy (2 or more classes of medications)  Non-Invasive Test Results: High-risk stress test findings: cardiac mortality >3%/year  Prior CABG: No previous CABG        [facility]

## 2014-10-29 NOTE — CV Procedure (Signed)
Procedure performed:  Left heart catheterization including hemodynamic monitoring of the left ventricle. Selective right and left coronary arteriography. PTCA and stenting of the distal, mid and proximal RCA with implantation of 3 overlapping drug-eluting stent, from distal to proximal 2.5 x 38 mm, 2.75 x 38 mm and a 2.75 x 20 mm Promus premier DES.  Indication: Patient is a 59 year-old Caucasian male with history of hypertension,  hyperlipidemia,  Diabetes Mellitus   who presents with class III angina pectoris in spite of aggressive medical therapy. He also has severe peripheral arterial disease. Outpatient stress test had revealed high-risk study, inferior severe ischemia, LV dilatation.  Hence is brought to the cardiac catheterization lab to evaluate  coronary anatomy for definitive diagnosis of CAD.  Hemodynamic data: Left ventricular pressure was 135/6 with LVEDP of 14 mm mercury. Aortic pressure was 135/67 with a mean of 93 mm mercury. There was no pressure gradient across the aortic valve.   Right coronary artery: Co-Dominant. it is diffusely diseased all the way from the proximal segment to the distal segment with tandem 70-80% stenosis in 2 areas distally and at least one area in the proximal segment, distally gives origin to large PDA and PL branches. PDA and PL branches are relatively disease-free.   Left main coronary artery is large and normal.  Circumflex coronary artery: A large vessel, codominant with RCA, gives origin to distal small PDA branches. There is mild diffuse disease. It gives origin to 3 small OM's.   LAD:  LAD gives origin to a large diagonal-1.  LAD milduminal irregularities.  the diagonal branch has mild 20-30% proximal stenosis.  Ramus intermediate: Is a very large caliber vessel, has a highly bifurcation and continues as a very large vessel. This secondary branch is also fairly large. There is mild proximal luminal irregularity. No high-grade stenosis.    Impression:  Significant single-vessel coronary artery disease involving diffusely diseased RCA. This correlates with the severe ischemia evident by stress testing. We'll proceed with intervention.   Interventional data: Successful PTCA and stenting of the distal, mid and proximal RCA with implantation of 3 overlapping drug-eluting stent, from distal to proximal 2.5 x 38 mm, 2.75 x 38 mm and a 2.75 x 20 mm Promus premier DES.   Will need Dual antiplatelet therapy with Plavix81 mg for at least1 year, probably much longer due to his multiple cardiac risk factors including severe peripheral arterial disease .   Technique of diagnostic cardiac catheterization:  Under sterile precautions using a 6 French right radial  arterial access, a 6 French sheath was introduced into the right radial artery. A 5 [place] Tig 4 catheter was advanced into the ascending aorta selective  right coronary artery and left coronary artery was cannulated and angiography was performed in multiple views. The catheter was pulled back Out of the body over exchange length J-wire. same Catheter was used to perfoHemodynamicser exchanged out of the body over J-Wire. NO immediate complications noted.  Technique of intervention:  Using a 6 [place] Ikari 1.0  guide catheter the right coronary artery   was selected and cannulated. Using Angiomax for anticoagulation, I utilicougar XT guidewire and across trightoronary artery without any difficulty. I placed the tip of the wire into the distal  coronary artery. Angiography was performed.   Then I utilized 2.5 x 30 mm emerge balloon , I performed balloon angioplast14 atmospheric  pressure x 2 for 40 seconds  Each in the distal and mid segment. Then I attempted to stent the  distal segment with a 2.5 x 38 mm Promus premier DES, because of inability to advance the stent through the proximal segment, I introduced the balloon back and balloon angioplasty of the proximal RCA was performed at 14  atmospheric pressure for 36 seconds. Then I was able to easily advance the stent to the distal segment, overlapping stenting was performed as dictated above with 3 overlapping stents. The proximal lesion was also carefully evaluated angiographically, it was felt to be significant as the site of resistance while passing the stent was also this segment, hence I decided to stent the entire proximal segment up to the ostium. This was followed by post dilatation with a 2.74 x 30 mm noncompliant emerge balloon at 16 atmospheric pressure for 40 seconds each 2 in the distal and midsegment followed by 20 atmospheric pressure in the proximal segment for 41 seconds and at 18 atmospheric pressure keeping the balloon outside of the ostium of the right coronary artery for 40 seconds. This was followed by angiography which revealed excellent results. Intracoronary nitroglycerin was administered at various stages during the procedure. TIMI-3 flow was maintained, the was no complication, no side branch compromise. The guidewire was withdrawn, guide catheter disengaged and pulled out of the body.  Hemostasis achieved with TR band. Patient tolerated the procedure well. A total of 1 90 mL of contrast was utilized for diagnostic and interventional procedure.  Disposition: Patient will be discharged in morning unless complications with out-patient follow up.

## 2014-10-29 NOTE — H&P (Signed)
  Please see office visit notes for complete details of HPI.  

## 2014-10-30 ENCOUNTER — Encounter (HOSPITAL_COMMUNITY): Payer: Self-pay | Admitting: Cardiology

## 2014-10-30 DIAGNOSIS — E1122 Type 2 diabetes mellitus with diabetic chronic kidney disease: Secondary | ICD-10-CM | POA: Diagnosis not present

## 2014-10-30 DIAGNOSIS — I25119 Atherosclerotic heart disease of native coronary artery with unspecified angina pectoris: Secondary | ICD-10-CM | POA: Diagnosis not present

## 2014-10-30 DIAGNOSIS — E119 Type 2 diabetes mellitus without complications: Secondary | ICD-10-CM | POA: Diagnosis not present

## 2014-10-30 DIAGNOSIS — I129 Hypertensive chronic kidney disease with stage 1 through stage 4 chronic kidney disease, or unspecified chronic kidney disease: Secondary | ICD-10-CM | POA: Diagnosis not present

## 2014-10-30 LAB — BASIC METABOLIC PANEL
Anion gap: 7 (ref 5–15)
BUN: 20 mg/dL (ref 6–23)
CO2: 27 mmol/L (ref 19–32)
Calcium: 8.4 mg/dL (ref 8.4–10.5)
Chloride: 103 mmol/L (ref 96–112)
Creatinine, Ser: 1.26 mg/dL (ref 0.50–1.35)
GFR calc Af Amer: 70 mL/min — ABNORMAL LOW (ref >90)
GFR calc non Af Amer: 60 mL/min — ABNORMAL LOW (ref >90)
Glucose, Bld: 248 mg/dL — ABNORMAL HIGH (ref 70–99)
Potassium: 4.2 mmol/L (ref 3.5–5.1)
Sodium: 137 mmol/L (ref 135–145)

## 2014-10-30 LAB — CBC
HCT: 29.5 % — ABNORMAL LOW (ref 39.0–52.0)
Hemoglobin: 10.3 g/dL — ABNORMAL LOW (ref 13.0–17.0)
MCH: 29.1 pg (ref 26.0–34.0)
MCHC: 34.9 g/dL (ref 30.0–36.0)
MCV: 83.3 fL (ref 78.0–100.0)
Platelets: 147 10*3/uL — ABNORMAL LOW (ref 150–400)
RBC: 3.54 MIL/uL — ABNORMAL LOW (ref 4.22–5.81)
RDW: 13.2 % (ref 11.5–15.5)
WBC: 7.8 10*3/uL (ref 4.0–10.5)

## 2014-10-30 LAB — GLUCOSE, CAPILLARY: Glucose-Capillary: 263 mg/dL — ABNORMAL HIGH (ref 70–99)

## 2014-10-30 MED ORDER — HYDRALAZINE HCL 20 MG/ML IJ SOLN
INTRAMUSCULAR | Status: AC
  Filled 2014-10-30: qty 1

## 2014-10-30 MED ORDER — CARVEDILOL 25 MG PO TABS: 37.5000 mg | ORAL_TABLET | Freq: Every morning | ORAL | Status: DC

## 2014-10-30 MED ORDER — PANTOPRAZOLE SODIUM 40 MG PO TBEC: 40.0000 mg | DELAYED_RELEASE_TABLET | Freq: Every day | ORAL | Status: DC

## 2014-10-30 MED ORDER — HYDRALAZINE HCL 20 MG/ML IJ SOLN
10.0000 mg | Freq: Once | INTRAMUSCULAR | Status: AC
  Administered 2014-10-30: 10 mg via INTRAVENOUS
  Filled 2014-10-30: qty 1

## 2014-10-30 MED ORDER — HYDRALAZINE HCL 20 MG/ML IJ SOLN
10.0000 mg | Freq: Once | INTRAMUSCULAR | Status: DC
Start: 2014-10-30 — End: 2014-10-30

## 2014-10-30 MED ORDER — HYDRALAZINE HCL 25 MG PO TABS: 25.0000 mg | ORAL_TABLET | Freq: Three times a day (TID) | ORAL | Status: DC

## 2014-10-30 NOTE — Discharge Summary (Addendum)
Physician Discharge Summary  Patient ID: [name] MRN: [id number] DOB/AGE: Jul 28, 1956 59 y.o.  Admit date: 10/29/2014 Discharge date: 10/30/2014  Primary Discharge Diagnosis 1. Class III angina pectoris, CAD of native vessels, S/P stenting of the distal, mid and proximal RCA with implantation of 3 overlapping drug-eluting stent, from distal to proximal 2.5 x 38 mm, 2.75 x 38 mm and a 2.75 x 20 mm Promus premier DES. Secondary Discharge Diagnosis 2. 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 3. Hypertension 4. Hyperlipidemia 5. Diabetes mellitus type 2 controlled 6. Peripheral arterial disease 7. Diabetic left foot ulcer that is slowly healing and is being managed by [name].  Significant Diagnostic Studies: 10/29/2014: Hemodynamic data: Left ventricular pressure was 135/6 with LVEDP of 14 mm mercury. Aortic pressure was 135/67 with a mean of 93 mm mercury. There was no pressure gradient across the aortic valve.   Right coronary artery: Co-Dominant. it is diffusely diseased all the way from the proximal segment to the distal segment with tandem 70-80% stenosis in 2 areas distally and at least one area in the proximal segment, distally gives origin to large PDA and PL branches. PDA and PL branches are relatively disease-free.   Left main coronary artery is large and normal.  Circumflex coronary artery: A large vessel, codominant with RCA, gives origin to distal small PDA branches. There is mild diffuse disease. It gives origin to 3 small OM's.   LAD: LAD gives origin to a large diagonal-1. LAD milduminal irregularities. the diagonal branch has mild 20-30% proximal stenosis.  Ramus intermediate: Is a very large caliber vessel, has a highly bifurcation and continues as a very large vessel. This secondary branch is also fairly large. There is mild proximal luminal irregularity. No high-grade stenosis.   Impression: Significant single-vessel coronary artery  disease involving diffusely diseased RCA. This correlates with the severe ischemia evident by stress testing. We'll proceed with intervention.   Interventional data: Successful PTCA and stenting of the distal, mid and proximal RCA with implantation of 3 overlapping drug-eluting stent, from distal to proximal 2.5 x 38 mm, 2.75 x 38 mm and a 2.75 x 20 mm Promus premier DES. Will need Dual antiplatelet therapy with Plavix81 mg for at least1 year, probably much longer due to his multiple cardiac risk factors including severe peripheral arterial disease .  Consults:   Hospital Course:  Patient is a 59 year-old Caucasian male with history of hypertension, hyperlipidemia, Diabetes Mellitus who presents with class III angina pectoris in spite of aggressive medical therapy. He also has severe peripheral arterial disease. Outpatient stress test had revealed high-risk study, inferior severe ischemia, LV dilatation. Hence patient was scheduled for outpatient coronary angiography. Patient underwent successful angioplasty to the right coronary artery with 3 overlapping drug-eluting stents, the following morning felt stable for discharge. His blood pressure remained high throughout the hospitalization, Coreg dose was increased to 37.5 mg by mouth twice a day from 25 mg every morning and 6.25 mg every afternoon.  His serum creatinine remains stable, he has stage II chronic kidney disease from diabetes mellitus. His blood sugar continued to remain elevated and needs much improvement at this point. Previously his hemoglobin A1c has been well controlled.  Recommendations on discharge: Coreg dose has been changed to 37.5 mg by mouth twice a day, Prevacid discontinued and started on pantoprazole 40 mg every before meals a.m. This is to avoid interaction with Plavix. Patient will need long-term dual antiplatelet therapy due to long overlapping drug-eluting  stents in the right coronary artery. I will follow him up in the  outpatient basis for management of his hypertension and if his serum creatinine is stable we could consider increasing Azor to 10/40 mg. For now I have added hydralazine 25 mg by mouth 3 times a day.  Discharge Exam: Blood pressure 176/64, pulse 66, temperature 98.2 F (36.8 C), temperature source Axillary, resp. rate 18, height 6' (1.829 m), weight 115.2 kg (253 lb 15.5 oz), SpO2 97 %.  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 Also has right 2 toes amputated laterally. There is a wound on the lateral aspect of the right foot. The lower extremities otherwise have full range of movements. There is no tenderness, there is trace leg edema on the left and 2+ right distal leg and ankle edema. Neurologic: Grossly normal Vascular exam: All pulses are well preserved including the foot. Right radial arterial access site has healed well. Labs:   Lab Results  Component Value Date   WBC 7.8 10/30/2014   HGB 10.3* 10/30/2014   HCT 29.5* 10/30/2014   MCV 83.3 10/30/2014   PLT 147* 10/30/2014    Recent Labs Lab 10/30/14 0410  NA 137  K 4.2  CL 103  CO2 27  BUN 20  CREATININE 1.26  CALCIUM 8.4  GLUCOSE 248*   Lab Results  Component Value Date   CKTOTAL 101 06/27/2012   CKMB 2.2 11/02/2011   TROPONINI <0.03 09/27/2014    EKG: EKG 10/30/2014: Normal sinus rhythm at a rate of 82 beats a minute, left atrial enlargement, left axis deviation, left anterior fascicular block, incomplete right bundle branch block, poor RV progression. Pulmonary disease pattern. LVH with repolarization of normality.  FOLLOW UP PLANS AND APPOINTMENTS Discharge Instructions    Amb Referral to Cardiac  Rehabilitation    Complete by:  As directed             Medication List    STOP taking these medications        docusate sodium 100 MG capsule  Commonly known as:  COLACE     lansoprazole 30 MG capsule  Commonly known as:  PREVACID      TAKE these medications        ALPRAZolam 0.5 MG tablet  Commonly known as:  XANAX  Take 0.25 mg by mouth 3 (three) times daily.     aspirin EC 81 MG tablet  Take 81 mg by mouth daily.     atorvastatin 40 MG tablet  Commonly known as:  LIPITOR  Take 40 mg by mouth daily.     AZOR 5-20 MG per tablet  Generic drug:  amLODipine-olmesartan  Take 1 tablet by mouth daily.     B-complex with vitamin C tablet  Take 1 tablet by mouth daily.     carvedilol 25 MG tablet  Commonly known as:  COREG  Take 1.5 tablets (37.5 mg total) by mouth every morning.     cephALEXin 500 MG capsule  Commonly known as:  KEFLEX  Take 1 capsule (500 mg total) by mouth 4 (four) times daily.     clopidogrel 75 MG tablet  Commonly known as:  PLAVIX  Take 75 mg by mouth daily.  1 tab daily     ezetimibe 10 MG tablet  Commonly known as:  ZETIA  Take 10 mg by mouth every morning.     fentaNYL 50 MCG/HR  Commonly known as:  DURAGESIC - dosed mcg/hr  Place 1 patch onto the skin every 3 (three) days.     furosemide 80 MG tablet  Commonly known as:  LASIX  Take 80 mg by mouth daily.     glyBURIDE-metformin 2.5-500 MG per tablet  Commonly known as:  GLUCOVANCE  Take 2 tablets by mouth 2 (two) times daily.     hydrALAZINE 25 MG tablet  Commonly known as:  APRESOLINE  Take 1 tablet (25 mg total) by mouth 3 (three) times daily.     HYDROmorphone 2 MG tablet  Commonly known as:  DILAUDID  Take 1 tablet (2 mg total) by mouth every 4 (four) hours as needed for pain.     insulin glargine 100 UNIT/ML injection  Commonly known as:  LANTUS  Inject 50 Units into the skin at bedtime.     insulin glulisine 100 UNIT/ML injection  Commonly known as:  APIDRA   Inject 10-20 Units into the skin 3 (three) times daily before meals. 10 breakfast 10 lunch and 20 supper     isosorbide mononitrate 120 MG 24 hr tablet  Commonly known as:  IMDUR  Take 120 mg by mouth daily.     Linaclotide 145 MCG Caps capsule  Commonly known as:  LINZESS  Take 1 capsule (145 mcg total) by mouth daily.     meloxicam 15 MG tablet  Commonly known as:  MOBIC  Take 15 mg by mouth daily.     multivitamin tablet  Take 1 tablet by mouth daily.     nitroGLYCERIN 0.4 MG SL tablet  Commonly known as:  NITROSTAT  Place 1 tablet (0.4 mg total) under the tongue every 5 (five) minutes as needed (as needed for esophageal spasm).     omega-3 acid ethyl esters 1 G capsule  Commonly known as:  LOVAZA  Take 2,000 mg by mouth 2 (two) times daily.     ondansetron 4 MG disintegrating tablet  Commonly known as:  ZOFRAN-ODT  Take 4 mg by mouth 4 (four) times daily as needed for nausea.     oxyCODONE-acetaminophen 5-325 MG per tablet  Commonly known as:  PERCOCET/ROXICET  Take 1 tablet by mouth every 8 (eight) hours as needed.     pantoprazole 40 MG tablet  Commonly known as:  PROTONIX  Take 1 tablet (40 mg total) by mouth daily before breakfast.     pregabalin 100 MG capsule  Commonly known as:  LYRICA  Take 100 mg by mouth 3 (three) times daily.     rOPINIRole 2 MG tablet  Commonly known as:  REQUIP  Take 2 mg by mouth at bedtime.     tamsulosin 0.4 MG Caps capsule  Commonly known as:  FLOMAX  Take 0.4 mg by mouth daily.           Follow-up Information    Follow up with Laverda Page, MD.   Specialty:  Cardiology   Why:  Keep previous appointment   Contact information:   [address] [place] [address] [phone]      Laverda Page, MD 10/30/2014, 10:04 AM  Pager: [phone] Office: [phone] If no answer: [phone]

## 2014-10-30 NOTE — Progress Notes (Signed)
CARDIAC REHAB PHASE I   PRE:  Rate/Rhythm: 49 SR  BP:  Sitting: 205/71 Left, 196/69 Right      SaO2: 98 RA  MODE:  Ambulation: Did not ambulate with patient r/t his hypertension. RN    And MD aware.   POST:  Rate/Rhythm:   BP:  Sitting:      SaO2:   [id number] Patient just returning from bathroom independently at initial contact. Steady gait observed. BP taken. Was noted to by hypertensive in both arms. Primary RN notified. After discussion with primary RN, it was decided that patient would not walk with cardiac rehab until his hypertension resolved. Education completed. Teach back noted. Patient expressed some interest in phase II cardiac rehab but needed time to discuss with wife. Information left at bedside. Stent card given. With patients permission, order placed for phase 2 rehab. Patient to walk with primary nurse prior to discharge.  Santina Evans, BSN 10/30/2014 8:55 AM

## 2014-11-01 MED FILL — Sodium Chloride IV Soln 0.9%: INTRAVENOUS | Qty: 50 | Status: AC

## 2014-11-17 ENCOUNTER — Ambulatory Visit: Payer: BLUE CROSS/BLUE SHIELD | Admitting: Infectious Diseases

## 2014-11-22 ENCOUNTER — Other Ambulatory Visit: Payer: Self-pay | Admitting: *Deleted

## 2014-11-22 DIAGNOSIS — R194 Change in bowel habit: Secondary | ICD-10-CM

## 2014-11-22 MED ORDER — LINACLOTIDE 145 MCG PO CAPS: 145.0000 ug | ORAL_CAPSULE | Freq: Every day | ORAL | Status: DC

## 2014-12-14 ENCOUNTER — Encounter: Payer: Self-pay | Admitting: Infectious Diseases

## 2014-12-14 ENCOUNTER — Ambulatory Visit (INDEPENDENT_AMBULATORY_CARE_PROVIDER_SITE_OTHER): Payer: BLUE CROSS/BLUE SHIELD | Admitting: Infectious Diseases

## 2014-12-14 VITALS — BP 178/88 | HR 68 | Temp 98.4°F | Wt 255.5 lb

## 2014-12-14 DIAGNOSIS — I209 Angina pectoris, unspecified: Secondary | ICD-10-CM

## 2014-12-14 DIAGNOSIS — E11621 Type 2 diabetes mellitus with foot ulcer: Secondary | ICD-10-CM

## 2014-12-14 DIAGNOSIS — T798XXD Other early complications of trauma, subsequent encounter: Secondary | ICD-10-CM | POA: Diagnosis not present

## 2014-12-14 DIAGNOSIS — E1159 Type 2 diabetes mellitus with other circulatory complications: Secondary | ICD-10-CM

## 2014-12-14 DIAGNOSIS — I10 Essential (primary) hypertension: Secondary | ICD-10-CM | POA: Diagnosis not present

## 2014-12-14 DIAGNOSIS — L97519 Non-pressure chronic ulcer of other part of right foot with unspecified severity: Secondary | ICD-10-CM

## 2014-12-14 DIAGNOSIS — T798XXA Other early complications of trauma, initial encounter: Secondary | ICD-10-CM | POA: Diagnosis not present

## 2014-12-14 MED ORDER — CEPHALEXIN 500 MG PO CAPS: 500.0000 mg | ORAL_CAPSULE | Freq: Four times a day (QID) | ORAL | Status: DC

## 2014-12-14 NOTE — Progress Notes (Signed)
   Subjective:    Patient ID: [name], male    DOB: 09/11/1955, 59 y.o.   MRN: [id number]  HPI 59 yo M with hx PAD status post bilateral lower extremity stenting 2 years ago at [facility], left carotid endarterectomy in 2012, type 2 diabetes mellitus non-insulin-dependent, hypertension, dyslipidemia, COPD, GERD/Barrett's esophagus, who underwent right fourth and fifth toe amputation a few years ago by [name]. In [facility] 2015, he had exposed bone with a small ulcer in the right foot and underwent transmetatarsal resection by [name], he went home and was doing well. He had sutures removed in early January then 09-25-14 he developed pain and drainage at the postop site. In the ER he was diagnosed with dehiscence of his postop wound with some puslike drainage. He was admitted on 09-26-14. He was evaluated by orthopedics and was treated with vanco/cefepime/flagyl. He did not undergo surgery. He was discussed with ID over the phone and was recommended to go home on po keflex for 2 weeks. He had a wound Cx which grew MSSA. He had plain films which did not show osteo. He was d/c home on 09-30-14. Finished keflex end of March 2016.   He had cardiac cath 2-19 and had 3 drug eluting stents in his RCA.  He has noticed new lesion on the dorsum of his L foot and also on his lateral L foot. These are ulcers now. He also has a wound between his 1st and 2nd toes.  Lateral wound has been draining yellow-orange then brown. No green. No erythema tracking up his leg. No fevers. Has had chills which are long standing.  Has seen dr [name] for f/u. Is getting his shoe fixed. He does not heel that his wound is infected. FSG have been 140-250. Usually in lower range.    Review of Systems  Constitutional: Positive for chills. Negative for fever.  Cardiovascular: Negative for chest pain.  Neurological: Negative for headaches.   See above.     Objective:   Physical Exam  Constitutional: He appears well-developed  and well-nourished.  Musculoskeletal:       Feet:          Assessment & Plan:

## 2014-12-14 NOTE — Assessment & Plan Note (Signed)
Will f/u with his PCP

## 2014-12-14 NOTE — Assessment & Plan Note (Signed)
I have asked him to f/u with PCP regarding his elevated BP.

## 2014-12-14 NOTE — Assessment & Plan Note (Signed)
His previous lateral foot wound is well healed. His new wounds are concerning. His lateral wound is worrisome due to the erythema.  Will give him keflex for 2 weeks. Asked him to call if he has any worsening. He has f/u with Dr [name] in the intervening period.  Will see him back in 6 weeks.

## 2014-12-16 ENCOUNTER — Telehealth (HOSPITAL_COMMUNITY): Payer: Self-pay | Admitting: *Deleted

## 2014-12-16 NOTE — Telephone Encounter (Signed)
Follow up call.  Message left regarding contacting cardiac rehab for sign up.  Pt was to check with insurance provider for benefits.

## 2014-12-23 ENCOUNTER — Telehealth (HOSPITAL_COMMUNITY): Payer: Self-pay | Admitting: *Deleted

## 2014-12-23 NOTE — Telephone Encounter (Signed)
Pt unable to participate in cardiac rehab due to insurance benefits - can not afford the out of pocket. Pt with poor circulation in his legs - having difficulty walking.  Plans to talk to MD about the stents in his legs. Cherre Huger, BSN

## 2015-01-11 ENCOUNTER — Other Ambulatory Visit (HOSPITAL_COMMUNITY): Payer: Self-pay | Admitting: Orthopedic Surgery

## 2015-01-13 ENCOUNTER — Encounter (HOSPITAL_COMMUNITY): Payer: Self-pay | Admitting: *Deleted

## 2015-01-13 MED ORDER — CEFAZOLIN SODIUM-DEXTROSE 2-3 GM-% IV SOLR: 2.0000 g | INTRAVENOUS | Status: AC

## 2015-01-13 NOTE — Progress Notes (Signed)
Anesthesia Chart Review: SAME DAY WORK-UP.  Patient is a 59 year old male scheduled for right BKA tomorrow by [name].    History includes smoking, HTN, DM2, COPD, HLD, GERD, fatty liver, hiatal hernia, OSACAD s/p DES to RCA 10/29/14, s/p left CEA,  PAD, right TMA 08/2014, left foot ray amputation '14. Had HTN with anesthesia in the past.  PCP is listed as Dr. Anda Kraft.  Cardiologist is [name], last visit 01/10/15. His note stated that patient is "acceptable CV risk and no need to stop ASA or Plavix."   Meds include Xanax, ASA, Lipitor, Azor, Coreg, Plavix, Zetia, Duragesic, Lasix, Glucovance, hydralazine, Dilaudid, Lantus, Apidra, Imdur, Linzess, Protonix, Lyrica, Requip, Flomax.  10/29/14 Cardiac cath: Hemodynamic data: Left ventricular pressure was 135/6 with LVEDP of 14 mm mercury. Aortic pressure was 135/67 with a mean of 93 mm mercury. There was no pressure gradient across the aortic valve.  Right coronary artery: Co-Dominant. it is diffusely diseased all the way from the proximal segment to the distal segment with tandem 70-80% stenosis in 2 areas distally and at least one area in the proximal segment, distally gives origin to large PDA and PL branches. PDA and PL branches are relatively disease-free.  Left main coronary artery is large and normal. Circumflex coronary artery: A large vessel, codominant with RCA, gives origin to distal small PDA branches. There is mild diffuse disease. It gives origin to 3 small OM's.  LAD: LAD gives origin to a large diagonal-1. LAD milduminal irregularities. the diagonal branch has mild 20-30% proximal stenosis. Ramus intermediate: Is a very large caliber vessel, has a highly bifurcation and continues as a very large vessel. This secondary branch is also fairly large. There is mild proximal luminal irregularity. No high-grade stenosis.  Impression: Significant single-vessel coronary artery disease involving diffusely diseased RCA. This correlates  with the severe ischemia evident by stress testing. We'll proceed with intervention.  Interventional data: Successful PTCA and stenting of the distal, mid and proximal RCA with implantation of 3 overlapping drug-eluting stent, from distal to proximal 2.5 x 38 mm, 2.75 x 38 mm and a 2.75 x 20 mm Promus premier DES. Will need Dual antiplatelet therapy with Plavix81 mg for at least1 year, probably much longer due to his multiple cardiac risk factors including severe peripheral arterial disease. ([name])   10/30/14 EKG: NSR, LAD, RBBB, minimal voltage criteria for LVH, septal infarct (age undetermined), T wave abnormality, consider lateral ischemia.  09/27/14 Echo: - Left ventricle: The cavity size was normal. There was moderate concentric hypertrophy. Systolic function was normal. The estimated ejection fraction was in the range of 55% to 60%. Wall motion was normal; there were no regional wall motionabnormalities. Features are consistent with a pseudonormal leftventricular filling pattern, with concomitant abnormal relaxation and increased filling pressure (grade 2 diastolic dysfunction).Doppler parameters are consistent with both elevated ventricularend-diastolic filling pressure and elevated left atrial fillingpressure. - Mitral valve: Calcified annulus. - Left atrium: The atrium was mildly dilated.  02/09/14 Carotid duplex: < 40% stenosis RICA, patent LCEA with minimal restenosis. Bilateral vertebral artery is antegrade.  09/26/14 1V CXR: IMPRESSION: 1. Airway thickening is present, suggesting bronchitis or reactive airways disease. 2. Mild atelectasis along the left hemidiaphragm. 3. Thoracic spondylosis.  He is for labs on arrival.   Further evaluation by his anesthesiologist on the day of surgery to ensure no acute changes.  [facility]/Anesthesiology Phone [phone] 01/13/2015 11:22 AM

## 2015-01-13 NOTE — Progress Notes (Signed)
Pt denies SOB and chest pain but is under the care of [name], cardiology. Spoke with [place], [place] [facility] anesthesia regarding pt history and clearance note. Spoke with Autumn to clarify if pt should take Aspirin and plavix as prescribed the morning of procedure. According to Autumn, pt should take both. Pt made aware not to take any diabetic medications the morning of procedure. Pt made aware to stop taking otc vitamins, NSAID's ( Mobic) and Herbal medications such as fish oil ( Lovaza). Pt verbalized understanding of al pre-op instructions.

## 2015-01-14 ENCOUNTER — Encounter (HOSPITAL_COMMUNITY)
Admission: EM | Disposition: A | Discharge status: Skilled Nursing Facility | Payer: Self-pay | Source: Home / Self Care | Attending: Internal Medicine

## 2015-01-14 ENCOUNTER — Inpatient Hospital Stay (HOSPITAL_COMMUNITY)
Admission: RE | Admit: 2015-01-14 | Payer: BLUE CROSS/BLUE SHIELD | Source: Ambulatory Visit | Admitting: Orthopedic Surgery

## 2015-01-14 ENCOUNTER — Emergency Department (HOSPITAL_COMMUNITY): Payer: BLUE CROSS/BLUE SHIELD

## 2015-01-14 ENCOUNTER — Inpatient Hospital Stay (HOSPITAL_COMMUNITY): Payer: BLUE CROSS/BLUE SHIELD | Admitting: Vascular Surgery

## 2015-01-14 ENCOUNTER — Encounter (HOSPITAL_COMMUNITY): Payer: Self-pay | Admitting: *Deleted

## 2015-01-14 ENCOUNTER — Inpatient Hospital Stay (HOSPITAL_COMMUNITY)
Admission: EM | Admit: 2015-01-14 | Discharge: 2015-01-20 | DRG: 853 | Disposition: A | Discharge status: Skilled Nursing Facility | Payer: BLUE CROSS/BLUE SHIELD | Attending: Internal Medicine | Admitting: Internal Medicine

## 2015-01-14 DIAGNOSIS — R6883 Chills (without fever): Secondary | ICD-10-CM | POA: Diagnosis present

## 2015-01-14 DIAGNOSIS — D62 Acute posthemorrhagic anemia: Secondary | ICD-10-CM | POA: Diagnosis not present

## 2015-01-14 DIAGNOSIS — J189 Pneumonia, unspecified organism: Secondary | ICD-10-CM | POA: Diagnosis present

## 2015-01-14 DIAGNOSIS — Z89421 Acquired absence of other right toe(s): Secondary | ICD-10-CM | POA: Diagnosis not present

## 2015-01-14 DIAGNOSIS — Z91041 Radiographic dye allergy status: Secondary | ICD-10-CM | POA: Diagnosis not present

## 2015-01-14 DIAGNOSIS — K2270 Barrett's esophagus without dysplasia: Secondary | ICD-10-CM | POA: Diagnosis present

## 2015-01-14 DIAGNOSIS — A419 Sepsis, unspecified organism: Principal | ICD-10-CM | POA: Diagnosis present

## 2015-01-14 DIAGNOSIS — G894 Chronic pain syndrome: Secondary | ICD-10-CM | POA: Diagnosis present

## 2015-01-14 DIAGNOSIS — N400 Enlarged prostate without lower urinary tract symptoms: Secondary | ICD-10-CM | POA: Diagnosis present

## 2015-01-14 DIAGNOSIS — I739 Peripheral vascular disease, unspecified: Secondary | ICD-10-CM | POA: Diagnosis present

## 2015-01-14 DIAGNOSIS — I10 Essential (primary) hypertension: Secondary | ICD-10-CM | POA: Diagnosis not present

## 2015-01-14 DIAGNOSIS — I129 Hypertensive chronic kidney disease with stage 1 through stage 4 chronic kidney disease, or unspecified chronic kidney disease: Secondary | ICD-10-CM | POA: Diagnosis present

## 2015-01-14 DIAGNOSIS — L97519 Non-pressure chronic ulcer of other part of right foot with unspecified severity: Secondary | ICD-10-CM

## 2015-01-14 DIAGNOSIS — L089 Local infection of the skin and subcutaneous tissue, unspecified: Secondary | ICD-10-CM | POA: Diagnosis not present

## 2015-01-14 DIAGNOSIS — T798XXS Other early complications of trauma, sequela: Secondary | ICD-10-CM | POA: Diagnosis not present

## 2015-01-14 DIAGNOSIS — I2510 Atherosclerotic heart disease of native coronary artery without angina pectoris: Secondary | ICD-10-CM | POA: Diagnosis present

## 2015-01-14 DIAGNOSIS — Z955 Presence of coronary angioplasty implant and graft: Secondary | ICD-10-CM

## 2015-01-14 DIAGNOSIS — T798XXD Other early complications of trauma, subsequent encounter: Secondary | ICD-10-CM

## 2015-01-14 DIAGNOSIS — M1990 Unspecified osteoarthritis, unspecified site: Secondary | ICD-10-CM | POA: Diagnosis present

## 2015-01-14 DIAGNOSIS — E11621 Type 2 diabetes mellitus with foot ulcer: Secondary | ICD-10-CM | POA: Diagnosis present

## 2015-01-14 DIAGNOSIS — R509 Fever, unspecified: Secondary | ICD-10-CM

## 2015-01-14 DIAGNOSIS — R194 Change in bowel habit: Secondary | ICD-10-CM

## 2015-01-14 DIAGNOSIS — N179 Acute kidney failure, unspecified: Secondary | ICD-10-CM | POA: Diagnosis not present

## 2015-01-14 DIAGNOSIS — Z96651 Presence of right artificial knee joint: Secondary | ICD-10-CM | POA: Diagnosis present

## 2015-01-14 DIAGNOSIS — E785 Hyperlipidemia, unspecified: Secondary | ICD-10-CM | POA: Diagnosis present

## 2015-01-14 DIAGNOSIS — R918 Other nonspecific abnormal finding of lung field: Secondary | ICD-10-CM

## 2015-01-14 DIAGNOSIS — F17210 Nicotine dependence, cigarettes, uncomplicated: Secondary | ICD-10-CM | POA: Diagnosis present

## 2015-01-14 DIAGNOSIS — N182 Chronic kidney disease, stage 2 (mild): Secondary | ICD-10-CM | POA: Diagnosis present

## 2015-01-14 DIAGNOSIS — Z9049 Acquired absence of other specified parts of digestive tract: Secondary | ICD-10-CM | POA: Diagnosis present

## 2015-01-14 DIAGNOSIS — Z7902 Long term (current) use of antithrombotics/antiplatelets: Secondary | ICD-10-CM

## 2015-01-14 DIAGNOSIS — G4730 Sleep apnea, unspecified: Secondary | ICD-10-CM | POA: Diagnosis present

## 2015-01-14 DIAGNOSIS — J449 Chronic obstructive pulmonary disease, unspecified: Secondary | ICD-10-CM | POA: Diagnosis present

## 2015-01-14 DIAGNOSIS — L03115 Cellulitis of right lower limb: Secondary | ICD-10-CM | POA: Diagnosis not present

## 2015-01-14 DIAGNOSIS — T148XXA Other injury of unspecified body region, initial encounter: Secondary | ICD-10-CM

## 2015-01-14 DIAGNOSIS — Z794 Long term (current) use of insulin: Secondary | ICD-10-CM | POA: Diagnosis not present

## 2015-01-14 DIAGNOSIS — Z7982 Long term (current) use of aspirin: Secondary | ICD-10-CM

## 2015-01-14 DIAGNOSIS — K219 Gastro-esophageal reflux disease without esophagitis: Secondary | ICD-10-CM | POA: Diagnosis present

## 2015-01-14 DIAGNOSIS — Z89521 Acquired absence of right knee: Secondary | ICD-10-CM | POA: Diagnosis not present

## 2015-01-14 DIAGNOSIS — E1142 Type 2 diabetes mellitus with diabetic polyneuropathy: Secondary | ICD-10-CM | POA: Diagnosis present

## 2015-01-14 DIAGNOSIS — M868X7 Other osteomyelitis, ankle and foot: Secondary | ICD-10-CM | POA: Diagnosis present

## 2015-01-14 DIAGNOSIS — R339 Retention of urine, unspecified: Secondary | ICD-10-CM | POA: Diagnosis not present

## 2015-01-14 HISTORY — PX: AMPUTATION: SHX166

## 2015-01-14 LAB — COMPREHENSIVE METABOLIC PANEL
ALT: 17 U/L (ref 17–63)
AST: 21 U/L (ref 15–41)
Albumin: 3.7 g/dL (ref 3.5–5.0)
Alkaline Phosphatase: 114 U/L (ref 38–126)
Anion gap: 11 (ref 5–15)
BUN: 16 mg/dL (ref 6–20)
CO2: 24 mmol/L (ref 22–32)
Calcium: 8.9 mg/dL (ref 8.9–10.3)
Chloride: 99 mmol/L — ABNORMAL LOW (ref 101–111)
Creatinine, Ser: 1.38 mg/dL — ABNORMAL HIGH (ref 0.61–1.24)
GFR calc Af Amer: >60 mL/min (ref >60)
GFR calc non Af Amer: 54 mL/min — ABNORMAL LOW (ref >60)
Glucose, Bld: 233 mg/dL — ABNORMAL HIGH (ref 70–99)
Potassium: 3.8 mmol/L (ref 3.5–5.1)
Sodium: 134 mmol/L — ABNORMAL LOW (ref 135–145)
Total Bilirubin: 0.9 mg/dL (ref 0.3–1.2)
Total Protein: 6.7 g/dL (ref 6.5–8.1)

## 2015-01-14 LAB — CBC WITH DIFFERENTIAL/PLATELET
Basophils Absolute: 0 10*3/uL (ref 0.0–0.1)
Basophils Relative: 0 % (ref 0–1)
Eosinophils Absolute: 0 10*3/uL (ref 0.0–0.7)
Eosinophils Relative: 0 % (ref 0–5)
HCT: 30.3 % — ABNORMAL LOW (ref 39.0–52.0)
Hemoglobin: 10.5 g/dL — ABNORMAL LOW (ref 13.0–17.0)
Lymphocytes Relative: 5 % — ABNORMAL LOW (ref 12–46)
Lymphs Abs: 0.7 10*3/uL (ref 0.7–4.0)
MCH: 29.1 pg (ref 26.0–34.0)
MCHC: 34.7 g/dL (ref 30.0–36.0)
MCV: 83.9 fL (ref 78.0–100.0)
Monocytes Absolute: 1.3 10*3/uL — ABNORMAL HIGH (ref 0.1–1.0)
Monocytes Relative: 9 % (ref 3–12)
Neutro Abs: 12.2 10*3/uL — ABNORMAL HIGH (ref 1.7–7.7)
Neutrophils Relative %: 86 % — ABNORMAL HIGH (ref 43–77)
Platelets: 191 10*3/uL (ref 150–400)
RBC: 3.61 MIL/uL — ABNORMAL LOW (ref 4.22–5.81)
RDW: 13.5 % (ref 11.5–15.5)
WBC: 14.3 10*3/uL — ABNORMAL HIGH (ref 4.0–10.5)

## 2015-01-14 LAB — URINALYSIS, ROUTINE W REFLEX MICROSCOPIC
Bilirubin Urine: NEGATIVE
Glucose, UA: >1000 mg/dL — AB
Hgb urine dipstick: NEGATIVE
Ketones, ur: NEGATIVE mg/dL
Leukocytes, UA: NEGATIVE
Nitrite: NEGATIVE
Protein, ur: NEGATIVE mg/dL
Specific Gravity, Urine: 1.018 (ref 1.005–1.030)
Urobilinogen, UA: 0.2 mg/dL (ref 0.0–1.0)
pH: 5.5 (ref 5.0–8.0)

## 2015-01-14 LAB — URINE MICROSCOPIC-ADD ON

## 2015-01-14 LAB — GLUCOSE, CAPILLARY
Glucose-Capillary: 135 mg/dL — ABNORMAL HIGH (ref 70–99)
Glucose-Capillary: 112 mg/dL — ABNORMAL HIGH (ref 70–99)
Glucose-Capillary: 158 mg/dL — ABNORMAL HIGH (ref 70–99)
Glucose-Capillary: 233 mg/dL — ABNORMAL HIGH (ref 70–99)
Glucose-Capillary: 221 mg/dL — ABNORMAL HIGH (ref 70–99)

## 2015-01-14 LAB — SURGICAL PCR SCREEN
MRSA, PCR: NEGATIVE
Staphylococcus aureus: NEGATIVE

## 2015-01-14 LAB — MRSA PCR SCREENING

## 2015-01-14 SURGERY — AMPUTATION BELOW KNEE
Anesthesia: Regional | Site: Leg Lower | Laterality: Right

## 2015-01-14 MED ORDER — MIDAZOLAM HCL 5 MG/5ML IJ SOLN
INTRAMUSCULAR | Status: DC | PRN
  Administered 2015-01-14 (×2): 1 mg via INTRAVENOUS

## 2015-01-14 MED ORDER — ONDANSETRON HCL 4 MG PO TABS
4.0000 mg | ORAL_TABLET | Freq: Four times a day (QID) | ORAL | Status: DC | PRN
  Administered 2015-01-15 – 2015-01-19 (×2): 4 mg via ORAL
  Filled 2015-01-14 (×2): qty 1

## 2015-01-14 MED ORDER — ACETAMINOPHEN 325 MG PO TABS
650.0000 mg | ORAL_TABLET | Freq: Four times a day (QID) | ORAL | Status: DC | PRN
  Administered 2015-01-14: 650 mg via ORAL
  Filled 2015-01-14: qty 2

## 2015-01-14 MED ORDER — IRBESARTAN 150 MG PO TABS
150.0000 mg | ORAL_TABLET | Freq: Every day | ORAL | Status: DC
  Administered 2015-01-14: 150 mg via ORAL
  Filled 2015-01-14 (×2): qty 1

## 2015-01-14 MED ORDER — HYDROMORPHONE HCL 1 MG/ML IJ SOLN
1.0000 mg | Freq: Once | INTRAMUSCULAR | Status: AC
  Administered 2015-01-14: 1 mg via INTRAVENOUS
  Filled 2015-01-14: qty 1

## 2015-01-14 MED ORDER — 0.9 % SODIUM CHLORIDE (POUR BTL) OPTIME
TOPICAL | Status: DC | PRN
  Administered 2015-01-14: 1000 mL

## 2015-01-14 MED ORDER — ALPRAZOLAM 0.25 MG PO TABS
0.2500 mg | ORAL_TABLET | Freq: Every morning | ORAL | Status: DC
Start: 2015-01-14 — End: 2015-01-20
  Administered 2015-01-15 – 2015-01-20 (×5): 0.25 mg via ORAL
  Filled 2015-01-14 (×6): qty 1

## 2015-01-14 MED ORDER — PANTOPRAZOLE SODIUM 40 MG PO TBEC
40.0000 mg | DELAYED_RELEASE_TABLET | Freq: Every day | ORAL | Status: DC
  Administered 2015-01-14 – 2015-01-20 (×7): 40 mg via ORAL
  Filled 2015-01-14 (×7): qty 1

## 2015-01-14 MED ORDER — CARVEDILOL 25 MG PO TABS
37.5000 mg | ORAL_TABLET | Freq: Every morning | ORAL | Status: DC
  Administered 2015-01-14 – 2015-01-15 (×2): 37.5 mg via ORAL
  Filled 2015-01-14 (×2): qty 1

## 2015-01-14 MED ORDER — LACTATED RINGERS IV SOLN
INTRAVENOUS | Status: DC
  Administered 2015-01-14: 12:00:00 via INTRAVENOUS

## 2015-01-14 MED ORDER — PIPERACILLIN-TAZOBACTAM 3.375 G IVPB 30 MIN
3.3750 g | Freq: Once | INTRAVENOUS | Status: AC
  Administered 2015-01-14: 3.375 g via INTRAVENOUS
  Filled 2015-01-14: qty 50

## 2015-01-14 MED ORDER — FENTANYL CITRATE (PF) 100 MCG/2ML IJ SOLN
INTRAMUSCULAR | Status: DC | PRN
  Administered 2015-01-14: 50 ug via INTRAVENOUS

## 2015-01-14 MED ORDER — PROPOFOL 10 MG/ML IV BOLUS
INTRAVENOUS | Status: AC
  Filled 2015-01-14: qty 20

## 2015-01-14 MED ORDER — ONDANSETRON HCL 4 MG/2ML IJ SOLN
4.0000 mg | Freq: Once | INTRAMUSCULAR | Status: AC
  Administered 2015-01-14: 4 mg via INTRAVENOUS
  Filled 2015-01-14: qty 2

## 2015-01-14 MED ORDER — LACTATED RINGERS IV SOLN
INTRAVENOUS | Status: DC | PRN
  Administered 2015-01-14: 12:00:00 via INTRAVENOUS

## 2015-01-14 MED ORDER — VANCOMYCIN HCL IN DEXTROSE 750-5 MG/150ML-% IV SOLN
750.0000 mg | Freq: Two times a day (BID) | INTRAVENOUS | Status: DC
  Administered 2015-01-14 – 2015-01-17 (×6): 750 mg via INTRAVENOUS
  Filled 2015-01-14 (×8): qty 150

## 2015-01-14 MED ORDER — ONDANSETRON HCL 4 MG/2ML IJ SOLN: 4.0000 mg | Freq: Four times a day (QID) | INTRAMUSCULAR | Status: DC | PRN

## 2015-01-14 MED ORDER — CLOPIDOGREL BISULFATE 75 MG PO TABS
75.0000 mg | ORAL_TABLET | Freq: Every day | ORAL | Status: DC
  Administered 2015-01-14 – 2015-01-20 (×7): 75 mg via ORAL
  Filled 2015-01-14 (×7): qty 1

## 2015-01-14 MED ORDER — AMLODIPINE-OLMESARTAN 5-20 MG PO TABS: 1.0000 | ORAL_TABLET | Freq: Every day | ORAL | Status: DC

## 2015-01-14 MED ORDER — ROPINIROLE HCL 1 MG PO TABS
2.0000 mg | ORAL_TABLET | Freq: Every day | ORAL | Status: DC
  Administered 2015-01-14 – 2015-01-19 (×6): 2 mg via ORAL
  Filled 2015-01-14 (×7): qty 2

## 2015-01-14 MED ORDER — LINACLOTIDE 145 MCG PO CAPS
145.0000 ug | ORAL_CAPSULE | Freq: Every day | ORAL | Status: DC
  Administered 2015-01-14 – 2015-01-18 (×5): 145 ug via ORAL
  Filled 2015-01-14 (×7): qty 1

## 2015-01-14 MED ORDER — FENTANYL CITRATE (PF) 250 MCG/5ML IJ SOLN
INTRAMUSCULAR | Status: AC
  Filled 2015-01-14: qty 5

## 2015-01-14 MED ORDER — SODIUM CHLORIDE 0.9 % IV SOLN: INTRAVENOUS | Status: DC

## 2015-01-14 MED ORDER — ONDANSETRON 4 MG PO TBDP
4.0000 mg | ORAL_TABLET | Freq: Four times a day (QID) | ORAL | Status: DC | PRN
  Filled 2015-01-14: qty 1

## 2015-01-14 MED ORDER — HYDROMORPHONE HCL 1 MG/ML IJ SOLN
1.0000 mg | INTRAMUSCULAR | Status: DC | PRN
  Administered 2015-01-14 – 2015-01-20 (×25): 1 mg via INTRAVENOUS
  Filled 2015-01-14 (×28): qty 1

## 2015-01-14 MED ORDER — ONDANSETRON HCL 4 MG/2ML IJ SOLN
INTRAMUSCULAR | Status: AC
  Filled 2015-01-14: qty 2

## 2015-01-14 MED ORDER — ASPIRIN EC 81 MG PO TBEC
81.0000 mg | DELAYED_RELEASE_TABLET | Freq: Every day | ORAL | Status: DC
  Administered 2015-01-14 – 2015-01-20 (×7): 81 mg via ORAL
  Filled 2015-01-14 (×7): qty 1

## 2015-01-14 MED ORDER — TAMSULOSIN HCL 0.4 MG PO CAPS
0.4000 mg | ORAL_CAPSULE | Freq: Every day | ORAL | Status: DC
  Administered 2015-01-14 – 2015-01-20 (×7): 0.4 mg via ORAL
  Filled 2015-01-14 (×7): qty 1

## 2015-01-14 MED ORDER — ATORVASTATIN CALCIUM 40 MG PO TABS
40.0000 mg | ORAL_TABLET | Freq: Every day | ORAL | Status: DC
  Administered 2015-01-14 – 2015-01-20 (×7): 40 mg via ORAL
  Filled 2015-01-14 (×7): qty 1

## 2015-01-14 MED ORDER — VANCOMYCIN HCL IN DEXTROSE 1-5 GM/200ML-% IV SOLN
1000.0000 mg | Freq: Once | INTRAVENOUS | Status: AC
  Administered 2015-01-14: 1000 mg via INTRAVENOUS
  Filled 2015-01-14: qty 200

## 2015-01-14 MED ORDER — METHOCARBAMOL 500 MG PO TABS
500.0000 mg | ORAL_TABLET | Freq: Four times a day (QID) | ORAL | Status: DC | PRN
  Administered 2015-01-15: 500 mg via ORAL
  Filled 2015-01-14 (×2): qty 1

## 2015-01-14 MED ORDER — ACETAMINOPHEN 650 MG RE SUPP: 650.0000 mg | Freq: Four times a day (QID) | RECTAL | Status: DC | PRN

## 2015-01-14 MED ORDER — EPHEDRINE SULFATE 50 MG/ML IJ SOLN
INTRAMUSCULAR | Status: AC
  Filled 2015-01-14: qty 1

## 2015-01-14 MED ORDER — MELOXICAM 15 MG PO TABS: 15.0000 mg | ORAL_TABLET | Freq: Every day | ORAL | Status: DC

## 2015-01-14 MED ORDER — SODIUM CHLORIDE 0.9 % IJ SOLN
3.0000 mL | Freq: Two times a day (BID) | INTRAMUSCULAR | Status: DC
  Administered 2015-01-14 – 2015-01-19 (×8): 3 mL via INTRAVENOUS

## 2015-01-14 MED ORDER — CEFAZOLIN SODIUM-DEXTROSE 2-3 GM-% IV SOLR
INTRAVENOUS | Status: AC
  Filled 2015-01-14: qty 50

## 2015-01-14 MED ORDER — ISOSORBIDE MONONITRATE ER 60 MG PO TB24
120.0000 mg | ORAL_TABLET | Freq: Every day | ORAL | Status: DC
  Administered 2015-01-14 – 2015-01-20 (×7): 120 mg via ORAL
  Filled 2015-01-14 (×7): qty 2

## 2015-01-14 MED ORDER — LIDOCAINE HCL (CARDIAC) 20 MG/ML IV SOLN
INTRAVENOUS | Status: AC
  Filled 2015-01-14: qty 5

## 2015-01-14 MED ORDER — ACETAMINOPHEN 325 MG PO TABS
650.0000 mg | ORAL_TABLET | Freq: Four times a day (QID) | ORAL | Status: DC | PRN
Start: 2015-01-14 — End: 2015-01-20
  Administered 2015-01-15: 650 mg via ORAL
  Filled 2015-01-14: qty 2

## 2015-01-14 MED ORDER — SODIUM CHLORIDE 0.9 % IV SOLN
INTRAVENOUS | Status: DC
  Administered 2015-01-14 – 2015-01-19 (×3): via INTRAVENOUS

## 2015-01-14 MED ORDER — METHOCARBAMOL 1000 MG/10ML IJ SOLN
500.0000 mg | INTRAVENOUS | Status: DC
  Filled 2015-01-14: qty 5

## 2015-01-14 MED ORDER — INSULIN GLARGINE 100 UNIT/ML ~~LOC~~ SOLN
50.0000 [IU] | Freq: Every day | SUBCUTANEOUS | Status: DC
  Administered 2015-01-14 – 2015-01-19 (×6): 50 [IU] via SUBCUTANEOUS
  Filled 2015-01-14 (×8): qty 0.5

## 2015-01-14 MED ORDER — CARVEDILOL 6.25 MG PO TABS
6.2500 mg | ORAL_TABLET | Freq: Every day | ORAL | Status: DC
  Administered 2015-01-14 – 2015-01-19 (×6): 6.25 mg via ORAL
  Filled 2015-01-14 (×8): qty 1

## 2015-01-14 MED ORDER — SODIUM CHLORIDE 0.9 % IV SOLN
INTRAVENOUS | Status: DC
  Administered 2015-01-14 (×2): via INTRAVENOUS

## 2015-01-14 MED ORDER — PROPOFOL INFUSION 10 MG/ML OPTIME
INTRAVENOUS | Status: DC | PRN
  Administered 2015-01-14: 50 ug/kg/min via INTRAVENOUS

## 2015-01-14 MED ORDER — CARVEDILOL 12.5 MG PO TABS
ORAL_TABLET | ORAL | Status: AC
  Filled 2015-01-14: qty 3

## 2015-01-14 MED ORDER — MIDAZOLAM HCL 2 MG/2ML IJ SOLN
INTRAMUSCULAR | Status: AC
  Filled 2015-01-14: qty 2

## 2015-01-14 MED ORDER — FENTANYL 50 MCG/HR TD PT72
75.0000 ug | MEDICATED_PATCH | TRANSDERMAL | Status: DC
  Administered 2015-01-14 – 2015-01-17 (×2): 75 ug via TRANSDERMAL
  Filled 2015-01-14 (×4): qty 1

## 2015-01-14 MED ORDER — HYDROMORPHONE HCL 1 MG/ML IJ SOLN
1.0000 mg | INTRAMUSCULAR | Status: DC | PRN
  Administered 2015-01-14 (×2): 1 mg via INTRAVENOUS
  Filled 2015-01-14 (×2): qty 1

## 2015-01-14 MED ORDER — METOCLOPRAMIDE HCL 5 MG/ML IJ SOLN
5.0000 mg | Freq: Three times a day (TID) | INTRAMUSCULAR | Status: DC | PRN
Start: 2015-01-14 — End: 2015-01-20
  Filled 2015-01-14: qty 2

## 2015-01-14 MED ORDER — PIPERACILLIN-TAZOBACTAM 3.375 G IVPB
3.3750 g | Freq: Three times a day (TID) | INTRAVENOUS | Status: DC
  Administered 2015-01-14 – 2015-01-17 (×9): 3.375 g via INTRAVENOUS
  Filled 2015-01-14 (×13): qty 50

## 2015-01-14 MED ORDER — METHOCARBAMOL 1000 MG/10ML IJ SOLN
500.0000 mg | Freq: Four times a day (QID) | INTRAVENOUS | Status: DC | PRN
  Filled 2015-01-14: qty 5

## 2015-01-14 MED ORDER — INSULIN ASPART 100 UNIT/ML ~~LOC~~ SOLN
0.0000 [IU] | SUBCUTANEOUS | Status: DC
  Administered 2015-01-14: 3 [IU] via SUBCUTANEOUS
  Administered 2015-01-14: 2 [IU] via SUBCUTANEOUS
  Administered 2015-01-14: 1 [IU] via SUBCUTANEOUS
  Administered 2015-01-14 – 2015-01-15 (×5): 3 [IU] via SUBCUTANEOUS
  Administered 2015-01-15 (×2): 2 [IU] via SUBCUTANEOUS
  Administered 2015-01-16: 3 [IU] via SUBCUTANEOUS
  Administered 2015-01-16: 5 [IU] via SUBCUTANEOUS
  Administered 2015-01-16: 2 [IU] via SUBCUTANEOUS
  Administered 2015-01-16 (×2): 1 [IU] via SUBCUTANEOUS
  Administered 2015-01-17 (×2): 2 [IU] via SUBCUTANEOUS
  Administered 2015-01-18: 3 [IU] via SUBCUTANEOUS
  Administered 2015-01-18: 2 [IU] via SUBCUTANEOUS
  Administered 2015-01-18: 3 [IU] via SUBCUTANEOUS
  Administered 2015-01-18: 1 [IU] via SUBCUTANEOUS
  Administered 2015-01-19: 2 [IU] via SUBCUTANEOUS
  Administered 2015-01-19: 3 [IU] via SUBCUTANEOUS
  Administered 2015-01-19: 2 [IU] via SUBCUTANEOUS
  Administered 2015-01-19 – 2015-01-20 (×3): 1 [IU] via SUBCUTANEOUS
  Administered 2015-01-20: 5 [IU] via SUBCUTANEOUS

## 2015-01-14 MED ORDER — ROCURONIUM BROMIDE 50 MG/5ML IV SOLN
INTRAVENOUS | Status: AC
  Filled 2015-01-14: qty 1

## 2015-01-14 MED ORDER — SODIUM CHLORIDE 0.9 % IJ SOLN
INTRAMUSCULAR | Status: AC
  Filled 2015-01-14: qty 10

## 2015-01-14 MED ORDER — FENTANYL 50 MCG/HR TD PT72: 50.0000 ug | MEDICATED_PATCH | TRANSDERMAL | Status: DC

## 2015-01-14 MED ORDER — PREGABALIN 100 MG PO CAPS
100.0000 mg | ORAL_CAPSULE | Freq: Three times a day (TID) | ORAL | Status: DC
  Administered 2015-01-14 – 2015-01-20 (×19): 100 mg via ORAL
  Filled 2015-01-14 (×19): qty 1

## 2015-01-14 MED ORDER — AMLODIPINE BESYLATE 5 MG PO TABS
5.0000 mg | ORAL_TABLET | Freq: Every day | ORAL | Status: DC
  Administered 2015-01-14: 5 mg via ORAL
  Filled 2015-01-14 (×2): qty 1

## 2015-01-14 MED ORDER — METOCLOPRAMIDE HCL 5 MG PO TABS
5.0000 mg | ORAL_TABLET | Freq: Three times a day (TID) | ORAL | Status: DC | PRN
  Filled 2015-01-14: qty 2

## 2015-01-14 MED ORDER — SUCCINYLCHOLINE CHLORIDE 20 MG/ML IJ SOLN
INTRAMUSCULAR | Status: AC
Start: 2015-01-14 — End: 2015-01-14
  Filled 2015-01-14: qty 1

## 2015-01-14 MED ORDER — HYDRALAZINE HCL 25 MG PO TABS
25.0000 mg | ORAL_TABLET | Freq: Three times a day (TID) | ORAL | Status: DC
  Administered 2015-01-14 – 2015-01-20 (×19): 25 mg via ORAL
  Filled 2015-01-14 (×21): qty 1

## 2015-01-14 SURGICAL SUPPLY — 44 items
BLADE SAW RECIP 87.9 MT (BLADE) ×2 IMPLANT
BLADE SURG 21 STRL SS (BLADE) ×2 IMPLANT
BNDG COHESIVE 6X5 TAN STRL LF (GAUZE/BANDAGES/DRESSINGS) ×3 IMPLANT
BNDG GAUZE ELAST 4 BULKY (GAUZE/BANDAGES/DRESSINGS) ×3 IMPLANT
COVER SURGICAL LIGHT HANDLE (MISCELLANEOUS) ×4 IMPLANT
CUFF TOURNIQUET SINGLE 34IN LL (TOURNIQUET CUFF) IMPLANT
CUFF TOURNIQUET SINGLE 44IN (TOURNIQUET CUFF) IMPLANT
DRAPE EXTREMITY T 121X128X90 (DRAPE) ×2 IMPLANT
DRAPE PROXIMA HALF (DRAPES) ×4 IMPLANT
DRAPE U-SHAPE 47X51 STRL (DRAPES) ×2 IMPLANT
DRSG ADAPTIC 3X8 NADH LF (GAUZE/BANDAGES/DRESSINGS) ×2 IMPLANT
DRSG PAD ABDOMINAL 8X10 ST (GAUZE/BANDAGES/DRESSINGS) ×2 IMPLANT
DURAPREP 26ML APPLICATOR (WOUND CARE) ×2 IMPLANT
ELECT REM PT RETURN 9FT ADLT (ELECTROSURGICAL) ×2
ELECTRODE REM PT RTRN 9FT ADLT (ELECTROSURGICAL) ×1 IMPLANT
GAUZE SPONGE 4X4 12PLY STRL (GAUZE/BANDAGES/DRESSINGS) ×2 IMPLANT
GLOVE BIO SURGEON STRL SZ 6.5 (GLOVE) ×2 IMPLANT
GLOVE BIOGEL PI IND STRL 6.5 (GLOVE) IMPLANT
GLOVE BIOGEL PI IND STRL 7.5 (GLOVE) IMPLANT
GLOVE BIOGEL PI IND STRL 9 (GLOVE) ×1 IMPLANT
GLOVE BIOGEL PI INDICATOR 6.5 (GLOVE) ×2
GLOVE BIOGEL PI INDICATOR 7.5 (GLOVE) ×1
GLOVE BIOGEL PI INDICATOR 9 (GLOVE) ×2
GLOVE SURG ORTHO 9.0 STRL STRW (GLOVE) ×2 IMPLANT
GLOVE SURG SS PI 6.5 STRL IVOR (GLOVE) ×1 IMPLANT
GOWN STRL REUS W/ TWL XL LVL3 (GOWN DISPOSABLE) ×2 IMPLANT
GOWN STRL REUS W/TWL XL LVL3 (GOWN DISPOSABLE) ×6
KIT BASIN OR (CUSTOM PROCEDURE TRAY) ×2 IMPLANT
KIT ROOM TURNOVER OR (KITS) ×2 IMPLANT
MANIFOLD NEPTUNE II (INSTRUMENTS) ×2 IMPLANT
NS IRRIG 1000ML POUR BTL (IV SOLUTION) ×2 IMPLANT
PACK GENERAL/GYN (CUSTOM PROCEDURE TRAY) ×2 IMPLANT
PAD ABD 8X10 STRL (GAUZE/BANDAGES/DRESSINGS) ×1 IMPLANT
PAD ARMBOARD 7.5X6 YLW CONV (MISCELLANEOUS) ×4 IMPLANT
SPONGE GAUZE 4X4 12PLY STER LF (GAUZE/BANDAGES/DRESSINGS) ×1 IMPLANT
SPONGE LAP 18X18 X RAY DECT (DISPOSABLE) IMPLANT
STAPLER VISISTAT 35W (STAPLE) IMPLANT
STOCKINETTE IMPERVIOUS LG (DRAPES) ×2 IMPLANT
SUT SILK 2 0 (SUTURE) ×2
SUT SILK 2-0 18XBRD TIE 12 (SUTURE) ×1 IMPLANT
SUT VIC AB 1 CTX 27 (SUTURE) IMPLANT
TOWEL OR 17X24 6PK STRL BLUE (TOWEL DISPOSABLE) ×2 IMPLANT
TOWEL OR 17X26 10 PK STRL BLUE (TOWEL DISPOSABLE) ×2 IMPLANT
WATER STERILE IRR 1000ML POUR (IV SOLUTION) ×2 IMPLANT

## 2015-01-14 NOTE — Progress Notes (Signed)
PATIENT DETAILS Name: [name] Age: 59 y.o. Sex: male Date of Birth: 07-22-1956 Admit Date: 01/14/2015 Admitting Physician Etta Quill, DO [id number] DENNIS, MD  Subjective: With right foot pain-no other complatins  Assessment/Plan: Principal Problem:   Sepsis:  Secondary to right foot ulcer with osteomyelitis, not sure if ?PNA (no cough or SOB-but seen on Xray).  Scheduled for a transtibial amputation today with [name].  Blood cultures are pending. Continue Zosyn and Vanc. Ortho following and planning on transtibial amputation later today. Follow     Active Problems:   Leukocytosis: secondary to above. Continue Abx, follow post amputation.     ? [id number] seen on CXR-but patient without symptoms. On broad spectrum Abx-that should cover   CAD-s/p recent PCI of RCA in Feb 2016:Continue Aspirin, Plavix, statin and Coreg. Spoke with Dr Marcellina Millin to proceed with surgery    HTN (hypertension): Controlled, continue Coreg, Avapro, Amlodipine and Imdur    Type 2 diabetes mellitus:  CBG's stable, continue Lantus and SSI.      BPH: Continue Flomax     Diabetic peripheral neuropathy: Continue Lyrica     Dyslipidemia: continue statin     PAD: (H/O Left popliteal artery atherectomy in June 2012. Right below knee angioplasty in 2013 and left below knee PTA 2014 by Dr. Brunetta Jeans at [place]).Continue Plavix, aspirin and statin for secondary prevention.     Hx of Carotid artery occulusion s/p left carotid endarterectomy  Disposition: Remain inpatient until stable post surgery.  Antimicrobial agents  See below  Anti-infectives    Start     Dose/Rate Route Frequency Ordered Stop   01/14/15 1600  [MAR Hold]  vancomycin (VANCOCIN) IVPB 750 mg/150 ml premix     (MAR Hold since 01/14/15 1030)   750 mg 150 mL/hr over 60 Minutes Intravenous Every 12 hours 01/14/15 0452     01/14/15 1049  ceFAZolin (ANCEF) 2-3 GM-% IVPB SOLR    Comments:  Sammuel Cooper    : cabinet override      01/14/15 1049 01/14/15 2259   01/14/15 1000  [MAR Hold]  piperacillin-tazobactam (ZOSYN) IVPB 3.375 g     (MAR Hold since 01/14/15 1030)   3.375 g 12.5 mL/hr over 240 Minutes Intravenous Every 8 hours 01/14/15 0452     01/14/15 0500  vancomycin (VANCOCIN) IVPB 1000 mg/200 mL premix     1,000 mg 200 mL/hr over 60 Minutes Intravenous  Once 01/14/15 0452 01/14/15 0739   01/14/15 0300  vancomycin (VANCOCIN) IVPB 1000 mg/200 mL premix     1,000 mg 200 mL/hr over 60 Minutes Intravenous  Once 01/14/15 0246 01/14/15 0526   01/14/15 0300  piperacillin-tazobactam (ZOSYN) IVPB 3.375 g     3.375 g 100 mL/hr over 30 Minutes Intravenous  Once 01/14/15 0246 01/14/15 0354      DVT Prophylaxis:  SCD's  Code Status: Full code   Family Communication Friend in room. Discussed plan with patient.   Procedures: Transtibial amputation   CONSULTS:  orthopedic surgery  Time spent 40 minutes-which includes 50% of the time with face-to-face with patient/ family and coordinating care related to the above assessment and plan.  MEDICATIONS: Scheduled Meds: . [MAR Hold] ALPRAZolam  0.25 mg Oral q morning - 10a  . [MAR Hold] irbesartan  150 mg Oral QHS   And  . [MAR Hold] amLODipine  5 mg Oral QHS  . [MAR Hold] aspirin EC  81 mg Oral Daily  . [MAR Hold] atorvastatin  40 mg Oral Daily  . [MAR Hold] carvedilol  37.5 mg Oral q morning - 10a  . [MAR Hold] carvedilol  6.25 mg Oral QHS  . ceFAZolin      . [MAR Hold] clopidogrel  75 mg Oral Daily  . [MAR Hold] fentaNYL  50 mcg Transdermal Q72H  . [MAR Hold] hydrALAZINE  25 mg Oral TID  . [MAR Hold] insulin aspart  0-9 Units Subcutaneous 6 times per day  . [MAR Hold] insulin glargine  50 Units Subcutaneous QHS  . [MAR Hold] isosorbide mononitrate  120 mg Oral Daily  . [MAR Hold] Linaclotide  145 mcg Oral Daily  . [MAR Hold] pantoprazole  40 mg Oral QAC breakfast  . [MAR Hold] piperacillin-tazobactam (ZOSYN)  IV  3.375 g  Intravenous Q8H  . [MAR Hold] pregabalin  100 mg Oral TID  . [MAR Hold] rOPINIRole  2 mg Oral QHS  . [MAR Hold] sodium chloride  3 mL Intravenous Q12H  . [MAR Hold] tamsulosin  0.4 mg Oral Daily  . [MAR Hold] vancomycin  750 mg Intravenous Q12H   Continuous Infusions: . sodium chloride 125 mL/hr at 01/14/15 0455  . lactated ringers     PRN Meds:.[MAR Hold] acetaminophen, [MAR Hold]  HYDROmorphone (DILAUDID) injection, [MAR Hold] ondansetron    PHYSICAL EXAM: Vital signs in last 24 hours: Filed Vitals:   01/14/15 0548 01/14/15 0800 01/14/15 0814 01/14/15 0900  BP: 122/57 99/55 101/54 118/62  Pulse: 75 62 63 66  Temp: 99.1 F (37.3 C)  97.9 F (36.6 C)   TempSrc: Oral  Oral   Resp: 17 14 16 14   Height: 6\' 2"  (1.88 m)     Weight: 115.7 kg (255 lb 1.2 oz)     SpO2: 95% 95% 96% 100%    Weight change:  Filed Weights   01/14/15 0441 01/14/15 0548  Weight: 115.9 kg (255 lb 8.2 oz) 115.7 kg (255 lb 1.2 oz)   Body mass index is 32.74 kg/(m^2).   Gen Exam: Awake and alert with sitting up in bed comfortably. In no apparent distress.  Neck: Supple, No JVD.  No thyromegaly.  Chest: B/L Clear.  BL air movement.  CVS: S1 S2 Regular, no murmurs.  Abdomen: soft, BS +, non tender, non distended.  Extremities: 2+ edema, lower extremities warm to touch. Neurologic: Non Focal.   Skin: Ulcer on lateral aspect of right foot. Wounds: Wound of lateral aspect of right foot   Intake/Output from previous day:  Intake/Output Summary (Last 24 hours) at 01/14/15 1103 Last data filed at 01/14/15 0955  Gross per 24 hour  Intake 810.42 ml  Output    600 ml  Net 210.42 ml     LAB RESULTS: CBC  Recent Labs Lab 01/14/15 0210  WBC 14.3*  HGB 10.5*  HCT 30.3*  PLT 191  MCV 83.9  MCH 29.1  MCHC 34.7  RDW 13.5  LYMPHSABS 0.7  MONOABS 1.3*  EOSABS 0.0  BASOSABS 0.0    Chemistries   Recent Labs Lab 01/14/15 0210  NA 134*  K 3.8  CL 99*  CO2 24  GLUCOSE 233*  BUN 16    CREATININE 1.38*  CALCIUM 8.9    CBG:  Recent Labs Lab 01/14/15 0817 01/14/15 1015  GLUCAP 135* 112*    GFR Estimated Creatinine Clearance: 77 mL/min (by C-G formula based on Cr of 1.38).  Coagulation profile No results for input(s): INR, PROTIME in the  last 168 hours.  Cardiac Enzymes No results for input(s): CKMB, TROPONINI, MYOGLOBIN in the last 168 hours.  Invalid input(s): CK  Invalid input(s): POCBNP No results for input(s): DDIMER in the last 72 hours. No results for input(s): HGBA1C in the last 72 hours. No results for input(s): CHOL, HDL, LDLCALC, TRIG, CHOLHDL, LDLDIRECT in the last 72 hours. No results for input(s): TSH, T4TOTAL, T3FREE, THYROIDAB in the last 72 hours.  Invalid input(s): FREET3 No results for input(s): VITAMINB12, FOLATE, FERRITIN, TIBC, IRON, RETICCTPCT in the last 72 hours. No results for input(s): LIPASE, AMYLASE in the last 72 hours.  Urine Studies No results for input(s): UHGB, CRYS in the last 72 hours.  Invalid input(s): UACOL, UAPR, USPG, UPH, UTP, UGL, UKET, UBIL, UNIT, UROB, ULEU, UEPI, UWBC, URBC, UBAC, CAST, UCOM, BILUA  MICROBIOLOGY: Recent Results (from the past 240 hour(s))  MRSA PCR Screening     Status: None   Collection Time: 01/14/15  5:54 AM  Result Value Ref Range Status   MRSA by PCR DUPLICATE TEST ORDERED/CREDITED PENDING [id number] NEGATIVE Final    Comment:        The GeneXpert MRSA Assay (FDA approved for NASAL specimens only), is one component of a comprehensive MRSA colonization surveillance program. It is not intended to diagnose MRSA infection nor to guide or monitor treatment for MRSA infections.   Surgical pcr screen     Status: None   Collection Time: 01/14/15  8:49 AM  Result Value Ref Range Status   MRSA, PCR NEGATIVE NEGATIVE Final   Staphylococcus aureus NEGATIVE NEGATIVE Final    Comment:        The Xpert SA Assay (FDA approved for NASAL specimens in patients over 58 years of age), is one  component of a comprehensive surveillance program.  Test performance has been validated by [facility] for patients greater than or equal to 27 year old. It is not intended to diagnose infection nor to guide or monitor treatment.     RADIOLOGY STUDIES/RESULTS: Dg Chest 2 View  01/14/2015   CLINICAL DATA:  Chills, vomiting and fever.  EXAM: CHEST  2 VIEW  COMPARISON:  Chest radiograph September 26, 2014  FINDINGS: Cardiac silhouette is mildly enlarged, unchanged. Tortuous mildly calcified aorta. Mild chronic bronchitic change without pleural effusion. Patchy RIGHT middle lobe airspace opacity. No pneumothorax. Cervical spine instrumentation partially imaged. Moderate degenerative change of the thoracic spine.  IMPRESSION: Mild cardiomegaly. Similar bronchitic changes with superimposed patchy RIGHT middle lobe airspace opacity which may reflect atelectasis or possibly pneumonia.   Electronically Signed   By: Elon Alas   On: 01/14/2015 04:07    Clover Mealy PA-S  [facility] Pager:[phone]  If 7PM-7AM, please contact night-coverage [email or web address] Password [email or web address] 01/14/2015, 11:03 AM   LOS: 0 days   Attending MD note  Patient was seen, examined,treatment plan was discussed with the PA-S.  I have personally reviewed the clinical findings, lab, imaging studies and management of this patient in detail. I agree with the documentation, as recorded by thePA-S.   Doing well since admission, major issues are as follows  Sepsis:due to diabetic foot with osteo-on empiric Vanco/Zosyn for amputation today. Follow cultures ?PNA: asypmtomatic. Above abx should cover in any event. Right lower lobe infiltrate on CXR (personally reviewed) [id number] PCI in Feb 2016-spoke with Dr Lorin Mercy Plavix, continue ASA. Cleared by Dr [name] for surgery  Rest of the issues as above  [facility]

## 2015-01-14 NOTE — Consult Note (Signed)
  Patient admitted this morning with increasing pain, elevated white cell count, and systemic symptoms secondary to the osteomyelitis of his right foot. Plan for transtibial amputation today. Risks and benefits were discussed with the patient he states he understands and wishes to proceed with surgery.

## 2015-01-14 NOTE — H&P (View-Only) (Signed)
  Patient admitted this morning with increasing pain, elevated white cell count, and systemic symptoms secondary to the osteomyelitis of his right foot. Plan for transtibial amputation today. Risks and benefits were discussed with the patient he states he understands and wishes to proceed with surgery.

## 2015-01-14 NOTE — Progress Notes (Signed)
Patient complaining of 10/10 pain. [facility] notified. Orders received

## 2015-01-14 NOTE — ED Notes (Signed)
Pt states that he is supposed to have a rt foot amputation in the morning. States he started experiencing cold chills and vomiting and called Dr [name] who told pt to come to the ED.

## 2015-01-14 NOTE — Progress Notes (Signed)
Utilization Review Completed.  

## 2015-01-14 NOTE — Anesthesia Procedure Notes (Addendum)
Procedure Name: MAC Date/Time: 01/14/2015 12:25 PM Performed by: Susa Loffler Pre-anesthesia Checklist: Patient identified, Timeout performed, Emergency Drugs available, Suction available and Patient being monitored Patient Re-evaluated:Patient Re-evaluated prior to inductionOxygen Delivery Method: Simple face mask Dental Injury: Teeth and Oropharynx as per pre-operative assessment     Anesthesia Regional Block:  Femoral nerve block  Pre-Anesthetic Checklist: ,, timeout performed, Correct Patient, Correct Site, Correct Laterality, Correct Procedure, Correct Position, site marked, Risks and benefits discussed, Surgical consent,  Pre-op evaluation,  Post-op pain management  Laterality: Right  Prep: chloraprep       Needles:  Injection technique: Single-shot  Needle Type: Stimulator Needle - 80     Needle Length: 9cm 9 cm Needle Gauge: 22 and 22 G  Needle insertion depth: 6 cm   Additional Needles:  Procedures: ultrasound guided (picture in chart) and nerve stimulator Femoral nerve block  Nerve Stimulator or Paresthesia:  Response: Patellar snap, 0.5 mA,   Additional Responses:   Narrative:  Injection made incrementally with aspirations every 5 mL.  Performed by: Personally  Anesthesiologist: Nolon Nations  Additional Notes: BP cuff, EKG monitors applied. Sedation begun. Femoral artery palpated for location of nerve. After nerve location verified with U/S, anesthetic injected incrementally, slowly, and after negative aspirations under direct u/s guidance. Good perineural spread. Patient tolerated well.   Anesthesia Regional Block:  Popliteal block  Pre-Anesthetic Checklist: ,, timeout performed, Correct Patient, Correct Site, Correct Laterality, Correct Procedure, Correct Position, site marked, Risks and benefits discussed, Surgical consent,  Pre-op evaluation,  Post-op pain management  Laterality: Right  Prep: chloraprep       Needles:  Injection  technique: Single-shot  Needle Type: Stimiplex     Needle Length: 10cm 10 cm Needle Gauge: 21 and 21 G    Additional Needles:  Procedures: ultrasound guided (picture in chart) and nerve stimulator  Motor weakness within 5 minutes. Popliteal block  Nerve Stimulator or Paresthesia:  Response: Plantar flexion/toe flexion, 0.5 mA,   Additional Responses:   Narrative:  Injection made incrementally with aspirations every 5 mL.  Performed by: Personally  Anesthesiologist: Nolon Nations  Additional Notes: Nerve located and needle positioned with direct ultrasound guidance. Good perineural spread. Patient tolerated well.

## 2015-01-14 NOTE — ED Provider Notes (Addendum)
CSN: [id number]     Arrival date & time 01/14/15  0155 History  This chart was scribed for Jola Schmidt, MD by Delphia Grates, ED Scribe. This patient was seen in room A06C/A06C and the patient's care was started at 2:31 AM.   Chief Complaint  Patient presents with  . Chills  . Emesis    The history is provided by the patient. No language interpreter was used.     HPI Comments: [name] is a 59 y.o. male, with history of DM, HTN, HLD, COPD, PAD, who presents to the Emergency Department complaining of chills that began PTA. There is associated diaphoresis, nausea, vomiting, and constant, 10/10 right foot pain. Patient states has an infection in his right foot and is scheduled to have a right foot amputation in the next 7-8 hours, performed by [name]. He was on Keflex for this infection, but was informed stop taking this medication yesterday (01/13/2015). He denies chest pain, SOB, cough, or urinary symptoms.    Past Medical History  Diagnosis Date  . Carotid artery occlusion 11/10/10    LEFT CAROTID ENDARTERECTOMY  . Diabetes mellitus   . Hypertension   . Hyperlipidemia   . COPD (chronic obstructive pulmonary disease)   . DJD (degenerative joint disease)   . GERD (gastroesophageal reflux disease)   . Substernal chest pain   . 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 Slurred speech     AS PER WIFE IN D/C NOTE 11/10/10  . PAD (peripheral artery disease)     Distal aortogram June 2012. Atherectomy left popliteal artery July 2012.   . Barrett's esophagus   . Fatty liver   . Diverticulosis of colon (without mention of hemorrhage)   . Emphysema of lung   . Osteomyelitis   . Complication of anesthesia     BP WENT UP AT [facility] "  . Sleep apnea     has not used CPAP in e years - took self off  . H/O hiatal hernia   . Neuromuscular disorder     peripheral neuropathy  . Full dentures   . Wears glasses    Past Surgical History  Procedure Laterality Date  .  Laminectomy      X 3 LUMBAR AND X 2 CERVICAL SPINE OPERATIONS  . Carotid endarterectomy  11/10/10  . Hernia repair      LEFT INGUINAL AND UMBILICAL REPAIRS  . Anterior fusion cervical spine  02/06/06    C4-5, C5-6, C6-7; SURGEON DR. MAX COHEN  . Penile prosthesis implant  08/14/05    INFRAPUBIC INSERTION OF INFLATABLE PENILE PROSTHESIS; SURGEON DR. Amalia Hailey  . Laparoscopic cholecystectomy w/ cholangiography  11/09/04    SURGEON DR. Luella Cook  . Total knee arthroplasty  07/2002    RIGHT KNEE ; SURGEON  DR. [name] ALSO HAD ARTHROSCOPIC RIGHT KNEE IN  10/2001  . Shoulder arthroscopy    . Cholecystectomy    . Amputation  11/05/2011    Procedure: AMPUTATION RAY;  Surgeon: Wylene Simmer, MD;  Location: [place];  Service: Orthopedics;  Laterality: Right;  Amputation of Right 4&5th Toes  . Femoral artery stent      x6  . Amputation Left 11/26/2012    Procedure: AMPUTATION RAY;  Surgeon: Wylene Simmer, MD;  Location: [place];  Service: Orthopedics;  Laterality: Left;  fourth ray amputation  . Cardiac catheterization  10/31/04    2009  . Carpal tunnel release Right 10/21/2013  Procedure: RIGHT CARPAL TUNNEL RELEASE;  Surgeon: Wynonia Sours, MD;  Location: [place];  Service: Orthopedics;  Laterality: Right;  . Ulnar nerve transposition Right 10/21/2013    Procedure: RIGHT ELBOW  ULNAR NERVE DECOMPRESSION;  Surgeon: Wynonia Sours, MD;  Location: [place];  Service: Orthopedics;  Laterality: Right;  . Joint replacement Right 2001    Total  . Spine surgery    . Neck surgery    . Esophageal manometry Bilateral 07/19/2014    Procedure: ESOPHAGEAL MANOMETRY (EM);  Surgeon: Jerene Bears, MD;  Location: [facility];  Service: Gastroenterology;  Laterality: Bilateral;  . Lower extremity angiogram N/A 03/19/2012    Procedure: LOWER EXTREMITY ANGIOGRAM;  Surgeon: Burnell Blanks, MD;  Location: [facility] CATH LAB;  Service: Cardiovascular;  Laterality: N/A;  . Amputation Right 08/27/2014     Procedure: Transmetatarsal Amputation;  Surgeon: Newt Minion, MD;  Location: [place];  Service: Orthopedics;  Laterality: Right;  . Left heart catheterization with coronary angiogram N/A 10/29/2014    Procedure: LEFT HEART CATHETERIZATION WITH CORONARY ANGIOGRAM;  Surgeon: Laverda Page, MD;  Location: [facility] CATH LAB;  Service: Cardiovascular;  Laterality: N/A;  . Percutaneous coronary stent intervention (pci-s) Right 10/29/2014    Procedure: PERCUTANEOUS CORONARY STENT INTERVENTION (PCI-S);  Surgeon: Laverda Page, MD;  Location: [facility];  Service: Cardiovascular;  Laterality: Right;   Family History  Problem Relation Age of Onset  . Heart disease Father     Before age 31-  CAD, BPG  . Diabetes Father     Amputation  . Cancer Father     PROSTATE  . Hyperlipidemia Father   . Hypertension Father   . Heart attack Father     Triple BPG  . Arthritis      GRANDMOTHER  . Hypertension      OTHER FAMILY MEMBERS  . Colon cancer Brother   . Cancer Brother     Colon  . Diabetes Brother   . Heart disease Brother 18    A-Fib. Before age 61  . Hyperlipidemia Brother   . Hypertension Brother   . Cancer Sister     Breast   History  Substance Use Topics  . Smoking status: Current Some Day Smoker -- 0.50 packs/day for 35 years    Types: E-cigarettes  . Smokeless tobacco: Never Used     Comment: VAPOR CIGARETTES  . Alcohol Use: No     Comment: ONCE IN A WHILE    Review of Systems  A complete 10 system review of systems was obtained and all systems are negative except as noted in the HPI and PMH.    Allergies  Ivp dye and Adhesive  Home Medications   Prior to Admission medications   Medication Sig Start Date End Date Taking? Authorizing Provider  ALPRAZolam Duanne Moron) 0.5 MG tablet Take 0.25 mg by mouth every morning.     Historical Provider, MD  aspirin EC 81 MG tablet Take 81 mg by mouth daily.    Historical Provider, MD  atorvastatin (LIPITOR) 40 MG tablet Take 40 mg  by mouth daily.    Historical Provider, MD  AZOR 5-20 MG per tablet Take 1 tablet by mouth at bedtime.  07/29/14   Historical Provider, MD  carvedilol (COREG) 25 MG tablet Take 1.5 tablets (37.5 mg total) by mouth every morning. 10/30/14   Adrian Prows, MD  carvedilol (COREG) 6.25 MG tablet Take 6.25 mg by mouth at bedtime.  Historical Provider, MD  cephALEXin (KEFLEX) 500 MG capsule Take 1 capsule (500 mg total) by mouth 4 (four) times daily. Patient taking differently: Take 500 mg by mouth 4 (four) times daily. For 21 days 12/14/14   Campbell Riches, MD  clobetasol (TEMOVATE) 0.05 % external solution Apply 1 application topically 2 (two) times daily. 10/08/14   Historical Provider, MD  clopidogrel (PLAVIX) 75 MG tablet Take 75 mg by mouth daily. 1 tab daily 03/28/12   Historical Provider, MD  clotrimazole-betamethasone (LOTRISONE) cream Apply 1 application topically 2 (two) times daily. 01/03/15   Historical Provider, MD  ezetimibe (ZETIA) 10 MG tablet Take 10 mg by mouth every morning.    Historical Provider, MD  fentaNYL (DURAGESIC - DOSED MCG/HR) 50 MCG/HR Place 1 patch onto the skin every 3 (three) days.    Historical Provider, MD  furosemide (LASIX) 80 MG tablet Take 80 mg by mouth daily.    Historical Provider, MD  glyBURIDE-metformin (GLUCOVANCE) 2.5-500 MG per tablet Take 2 tablets by mouth 2 (two) times daily.  03/05/12   Historical Provider, MD  hydrALAZINE (APRESOLINE) 25 MG tablet Take 1 tablet (25 mg total) by mouth 3 (three) times daily. 10/30/14   Adrian Prows, MD  HYDROmorphone (DILAUDID) 2 MG tablet Take 1 tablet (2 mg total) by mouth every 4 (four) hours as needed for pain. 11/27/12   Ripudeep Krystal Eaton, MD  insulin glargine (LANTUS) 100 UNIT/ML injection Inject 50 Units into the skin at bedtime.     Historical Provider, MD  insulin glulisine (APIDRA) 100 UNIT/ML injection Inject 10-15 Units into the skin 3 (three) times daily before meals. 10 breakfast 10 lunch and 15 supper    Historical  Provider, MD  isosorbide mononitrate (IMDUR) 120 MG 24 hr tablet Take 120 mg by mouth daily.    Historical Provider, MD  Linaclotide Rolan Lipa) 145 MCG CAPS capsule Take 1 capsule (145 mcg total) by mouth daily. 11/22/14   Jerene Bears, MD  meloxicam (MOBIC) 15 MG tablet Take 15 mg by mouth daily.    Historical Provider, MD  Multiple Vitamin (MULTIVITAMIN) tablet Take 1 tablet by mouth daily.    Historical Provider, MD  nitroGLYCERIN (NITROSTAT) 0.4 MG SL tablet Place 1 tablet (0.4 mg total) under the tongue every 5 (five) minutes as needed (as needed for esophageal spasm). 08/09/14   Jerene Bears, MD  omega-3 acid ethyl esters (LOVAZA) 1 G capsule Take 2,000 mg by mouth 2 (two) times daily.     Historical Provider, MD  ondansetron (ZOFRAN-ODT) 4 MG disintegrating tablet Take 4 mg by mouth 4 (four) times daily as needed for nausea.    Historical Provider, MD  oxyCODONE-acetaminophen (PERCOCET/ROXICET) 5-325 MG per tablet Take 1 tablet by mouth every 8 (eight) hours as needed. Patient taking differently: Take 1 tablet by mouth every 8 (eight) hours.  09/30/14   Thurnell Lose, MD  pantoprazole (PROTONIX) 40 MG tablet Take 1 tablet (40 mg total) by mouth daily before breakfast. 10/30/14   Adrian Prows, MD  Polyethyl Glycol-Propyl Glycol (SYSTANE ULTRA OP) Place 1 drop into both eyes as needed (for dry eyes).    Historical Provider, MD  pregabalin (LYRICA) 100 MG capsule Take 100 mg by mouth 3 (three) times daily.    Historical Provider, MD  rOPINIRole (REQUIP) 2 MG tablet Take 2 mg by mouth at bedtime.     Historical Provider, MD  Tamsulosin HCl (FLOMAX) 0.4 MG CAPS Take 0.4 mg by mouth daily.  Historical Provider, MD   Triage Vitals: BP 160/68 mmHg  Pulse 107  Temp(Src) 101.2 F (38.4 C) (Oral)  SpO2 95%  Physical Exam  Constitutional: He is oriented to person, place, and time. He appears well-developed and well-nourished.  HENT:  Head: Normocephalic and atraumatic.  Eyes: EOM are normal.   Neck: Normal range of motion.  Cardiovascular: Normal rate, regular rhythm, normal heart sounds and intact distal pulses.   Pulmonary/Chest: Effort normal and breath sounds normal. No respiratory distress.  Abdominal: Soft. He exhibits no distension. There is no tenderness.  Musculoskeletal: Normal range of motion.  Lateral swelling of the right midfoot consistent with infection abscess.  No significant spreading erythema or other skin changes of the right foot or right lower leg.  No crepitus.  Neurological: He is alert and oriented to person, place, and time.  Skin: Skin is warm and dry.  Psychiatric: He has a normal mood and affect. Judgment normal.  Nursing note and vitals reviewed.   ED Course  Procedures (including critical care time)  DIAGNOSTIC STUDIES: Oxygen Saturation is 95% on room air, adequate by my interpretation.    COORDINATION OF CARE: At 0237 Discussed treatment plan with patient which includes labs. Patient agrees.   Labs Review Labs Reviewed  CBC WITH DIFFERENTIAL/PLATELET - Abnormal; Notable for the following:    WBC 14.3 (*)    RBC 3.61 (*)    Hemoglobin 10.5 (*)    HCT 30.3 (*)    Neutrophils Relative % 86 (*)    Neutro Abs 12.2 (*)    Lymphocytes Relative 5 (*)    Monocytes Absolute 1.3 (*)    All other components within normal limits  COMPREHENSIVE METABOLIC PANEL - Abnormal; Notable for the following:    Sodium 134 (*)    Chloride 99 (*)    Glucose, Bld 233 (*)    Creatinine, Ser 1.38 (*)    GFR calc non Af Amer 54 (*)    All other components within normal limits  URINE CULTURE  CULTURE, BLOOD (ROUTINE X 2)  CULTURE, BLOOD (ROUTINE X 2)  URINALYSIS, ROUTINE W REFLEX MICROSCOPIC   BUN  Date Value Ref Range Status  01/14/2015 16 6 - 20 mg/dL Final  10/30/2014 20 6 - 23 mg/dL Final  09/29/2014 16 6 - 23 mg/dL Final  09/27/2014 21 6 - 23 mg/dL Final   CREAT  Date Value Ref Range Status  02/01/2012 1.00 0.50 - 1.35 mg/dL Final  11/20/2011  0.72 0.50 - 1.35 mg/dL Final   CREATININE, SER  Date Value Ref Range Status  01/14/2015 1.38* 0.61 - 1.24 mg/dL Final  10/30/2014 1.26 0.50 - 1.35 mg/dL Final  09/29/2014 1.11 0.50 - 1.35 mg/dL Final  09/27/2014 1.26 0.50 - 1.35 mg/dL Final      Imaging Review Dg Chest 2 View  01/14/2015   CLINICAL DATA:  Chills, vomiting and fever.  EXAM: CHEST  2 VIEW  COMPARISON:  Chest radiograph September 26, 2014  FINDINGS: Cardiac silhouette is mildly enlarged, unchanged. Tortuous mildly calcified aorta. Mild chronic bronchitic change without pleural effusion. Patchy RIGHT middle lobe airspace opacity. No pneumothorax. Cervical spine instrumentation partially imaged. Moderate degenerative change of the thoracic spine.  IMPRESSION: Mild cardiomegaly. Similar bronchitic changes with superimposed patchy RIGHT middle lobe airspace opacity which may reflect atelectasis or possibly pneumonia.   Electronically Signed   By: Elon Alas   On: 01/14/2015 04:07     EKG Interpretation None      MDM  Final diagnoses:  Fever, unspecified fever cause  Right foot infection    Given the patient's age and fever he is at risk for bacteremia given his significant right foot infection.  He is scheduled for right BKA in just a few hours.  I will discuss his case with orthopedic surgery and they will need to determine whether or not he still candidate for surgery.  My best guess is he needs to have his amputation in order to minimize his risk of infection.  I think the patient would be a candidate for continuing to move forward with the surgery today.  He will remain NPO at this time.  Vancomycin and Zosyn given.  Chest x-ray obtained for fever workup.  They question whether or not there is a small pneumonia.  Clinically the patient has no shortness of breath or cough or any other respiratory symptoms.  Anesthesia will need to weigh and as to whether or not they still believe he is a good operative candidate if he  is developing a small pneumonia.  Clinically I think this is insignificant at think the most important thing for him is to move forward with his amputation.  Patient will be admitted to the hospitalist service.  Case will be discussed with orthopedic surgery   I personally performed the services described in this documentation, which was scribed in my presence. The recorded information has been reviewed and is accurate.   6:28 AM Spoke with Dr Louanne Skye who will relay the message to Dr [name] and have him see him this morning    Jola Schmidt, MD 01/14/15 [place], MD 01/14/15 [phone]

## 2015-01-14 NOTE — H&P (Signed)
[facility] History and Physical  ADOM CABADAS [id number] DOB: 26-Apr-1956 DOA: 01/14/2015  Referring physician: EDP PCP: [name], MD   Chief Complaint: Chills, emesis   HPI: [name] is a 59 y.o. male with h/o DM2, R diabetic foot ulcer with osteomyelitis, abscess, and is scheduled to have R foot amputation with [name] actually later today here at [facility].  He presents to the ED with chills, nausea, vomiting, and a constant 10/10 foot pain.  He had been on Keflex for this diabetic foot ulcer, but had been told to "stop the keflex" yesterday he states.  Regardless, after not taking keflex yesterday, he developed the above symptoms last night.  Review of Systems: Systems reviewed.  As above, otherwise negative  Past Medical History  Diagnosis Date  . Carotid artery occlusion 11/10/10    LEFT CAROTID ENDARTERECTOMY  . Diabetes mellitus   . Hypertension   . Hyperlipidemia   . COPD (chronic obstructive pulmonary disease)   . DJD (degenerative joint disease)   . GERD (gastroesophageal reflux disease)   . Substernal chest pain   . 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 Slurred speech     AS PER WIFE IN D/C NOTE 11/10/10  . PAD (peripheral artery disease)     Distal aortogram June 2012. Atherectomy left popliteal artery July 2012.   . Barrett's esophagus   . Fatty liver   . Diverticulosis of colon (without mention of hemorrhage)   . Emphysema of lung   . Osteomyelitis   . Complication of anesthesia     BP WENT UP AT [facility] "  . Sleep apnea     has not used CPAP in e years - took self off  . H/O hiatal hernia   . Neuromuscular disorder     peripheral neuropathy  . Full dentures   . Wears glasses    Past Surgical History  Procedure Laterality Date  . Laminectomy      X 3 LUMBAR AND X 2 CERVICAL SPINE OPERATIONS  . Carotid endarterectomy  11/10/10  . Hernia repair      LEFT INGUINAL AND UMBILICAL REPAIRS  . Anterior fusion cervical  spine  02/06/06    C4-5, C5-6, C6-7; SURGEON DR. MAX COHEN  . Penile prosthesis implant  08/14/05    INFRAPUBIC INSERTION OF INFLATABLE PENILE PROSTHESIS; SURGEON DR. Amalia Hailey  . Laparoscopic cholecystectomy w/ cholangiography  11/09/04    SURGEON DR. Luella Cook  . Total knee arthroplasty  07/2002    RIGHT KNEE ; SURGEON  DR. [name] ALSO HAD ARTHROSCOPIC RIGHT KNEE IN  10/2001  . Shoulder arthroscopy    . Cholecystectomy    . Amputation  11/05/2011    Procedure: AMPUTATION RAY;  Surgeon: Wylene Simmer, MD;  Location: [place];  Service: Orthopedics;  Laterality: Right;  Amputation of Right 4&5th Toes  . Femoral artery stent      x6  . Amputation Left 11/26/2012    Procedure: AMPUTATION RAY;  Surgeon: Wylene Simmer, MD;  Location: [place];  Service: Orthopedics;  Laterality: Left;  fourth ray amputation  . Cardiac catheterization  10/31/04    2009  . Carpal tunnel release Right 10/21/2013    Procedure: RIGHT CARPAL TUNNEL RELEASE;  Surgeon: Wynonia Sours, MD;  Location: [place];  Service: Orthopedics;  Laterality: Right;  . Ulnar nerve transposition Right 10/21/2013    Procedure: RIGHT ELBOW  ULNAR NERVE DECOMPRESSION;  Surgeon: Wynonia Sours, MD;  Location: [place];  Service: Orthopedics;  Laterality: Right;  . Joint replacement Right 2001    Total  . Spine surgery    . Neck surgery    . Esophageal manometry Bilateral 07/19/2014    Procedure: ESOPHAGEAL MANOMETRY (EM);  Surgeon: Jerene Bears, MD;  Location: [facility];  Service: Gastroenterology;  Laterality: Bilateral;  . Lower extremity angiogram N/A 03/19/2012    Procedure: LOWER EXTREMITY ANGIOGRAM;  Surgeon: Burnell Blanks, MD;  Location: [facility] CATH LAB;  Service: Cardiovascular;  Laterality: N/A;  . Amputation Right 08/27/2014    Procedure: Transmetatarsal Amputation;  Surgeon: Newt Minion, MD;  Location: [place];  Service: Orthopedics;  Laterality: Right;  . Left heart catheterization with coronary  angiogram N/A 10/29/2014    Procedure: LEFT HEART CATHETERIZATION WITH CORONARY ANGIOGRAM;  Surgeon: Laverda Page, MD;  Location: [facility] C[facility];  Service: Cardiovascular;  Laterality: N/A;  . Percutaneous coronary stent intervention (pci-s) Right 10/29/2014    Procedure: PERCUTANEOUS CORONARY STENT INTERVENTION (PCI-S);  Surgeon: Laverda Page, MD;  Location: [facility] CATH LAB;  Service: Cardiovascular;  Laterality: Right;   Social History:  reports that he has been smoking E-cigarettes.  He has a 17.5 pack-year smoking history. He has never used smokeless tobacco. He reports that he does not drink alcohol or use illicit drugs.  Allergies  Allergen Reactions  . Ivp Dye [Iodinated Diagnostic Agents] Anaphylaxis    Breathing problems  . Adhesive [Tape] Rash    Family History  Problem Relation Age of Onset  . Heart disease Father     Before age 44-  CAD, BPG  . Diabetes Father     Amputation  . Cancer Father     PROSTATE  . Hyperlipidemia Father   . Hypertension Father   . Heart attack Father     Triple BPG  . Arthritis      GRANDMOTHER  . Hypertension      OTHER FAMILY MEMBERS  . Colon cancer Brother   . Cancer Brother     Colon  . Diabetes Brother   . Heart disease Brother 76    A-Fib. Before age 38  . Hyperlipidemia Brother   . Hypertension Brother   . Cancer Sister     Breast     Prior to Admission medications   Medication Sig Start Date End Date Taking? Authorizing Provider  ALPRAZolam Duanne Moron) 0.5 MG tablet Take 0.25 mg by mouth every morning.    Yes Historical Provider, MD  aspirin EC 81 MG tablet Take 81 mg by mouth daily.   Yes Historical Provider, MD  atorvastatin (LIPITOR) 40 MG tablet Take 40 mg by mouth daily.   Yes Historical Provider, MD  AZOR 5-20 MG per tablet Take 1 tablet by mouth at bedtime.  07/29/14  Yes Historical Provider, MD  carvedilol (COREG) 25 MG tablet Take 1.5 tablets (37.5 mg total) by mouth every morning. 10/30/14  Yes Adrian Prows, MD   carvedilol (COREG) 6.25 MG tablet Take 6.25 mg by mouth at bedtime.   Yes Historical Provider, MD  cephALEXin (KEFLEX) 500 MG capsule Take 1 capsule (500 mg total) by mouth 4 (four) times daily. Patient taking differently: Take 500 mg by mouth 4 (four) times daily. For 21 days 12/14/14  Yes Campbell Riches, MD  clobetasol (TEMOVATE) 0.05 % external solution Apply 1 application topically 2 (two) times daily. 10/08/14  Yes Historical Provider, MD  clopidogrel (PLAVIX) 75 MG  tablet Take 75 mg by mouth daily. 1 tab daily 03/28/12  Yes Historical Provider, MD  clotrimazole-betamethasone (LOTRISONE) cream Apply 1 application topically 2 (two) times daily. 01/03/15  Yes Historical Provider, MD  ezetimibe (ZETIA) 10 MG tablet Take 10 mg by mouth every morning.   Yes Historical Provider, MD  fentaNYL (DURAGESIC - DOSED MCG/HR) 50 MCG/HR Place 1 patch onto the skin every 3 (three) days.   Yes Historical Provider, MD  furosemide (LASIX) 80 MG tablet Take 80 mg by mouth daily.   Yes Historical Provider, MD  glyBURIDE-metformin (GLUCOVANCE) 2.5-500 MG per tablet Take 2 tablets by mouth 2 (two) times daily.  03/05/12  Yes Historical Provider, MD  hydrALAZINE (APRESOLINE) 25 MG tablet Take 1 tablet (25 mg total) by mouth 3 (three) times daily. 10/30/14  Yes Adrian Prows, MD  HYDROmorphone (DILAUDID) 2 MG tablet Take 1 tablet (2 mg total) by mouth every 4 (four) hours as needed for pain. 11/27/12  Yes Ripudeep Krystal Eaton, MD  insulin glargine (LANTUS) 100 UNIT/ML injection Inject 50 Units into the skin at bedtime.    Yes Historical Provider, MD  insulin glulisine (APIDRA) 100 UNIT/ML injection Inject 10-15 Units into the skin 3 (three) times daily before meals. 10 breakfast 10 lunch and 15 supper   Yes Historical Provider, MD  isosorbide mononitrate (IMDUR) 120 MG 24 hr tablet Take 120 mg by mouth daily.   Yes Historical Provider, MD  Linaclotide Rolan Lipa) 145 MCG CAPS capsule Take 1 capsule (145 mcg total) by mouth daily.  11/22/14  Yes Jerene Bears, MD  meloxicam (MOBIC) 15 MG tablet Take 15 mg by mouth daily.   Yes Historical Provider, MD  Multiple Vitamin (MULTIVITAMIN) tablet Take 1 tablet by mouth daily.   Yes Historical Provider, MD  nitroGLYCERIN (NITROSTAT) 0.4 MG SL tablet Place 1 tablet (0.4 mg total) under the tongue every 5 (five) minutes as needed (as needed for esophageal spasm). 08/09/14  Yes Jerene Bears, MD  omega-3 acid ethyl esters (LOVAZA) 1 G capsule Take 2,000 mg by mouth 2 (two) times daily.    Yes Historical Provider, MD  ondansetron (ZOFRAN-ODT) 4 MG disintegrating tablet Take 4 mg by mouth 4 (four) times daily as needed for nausea.   Yes Historical Provider, MD  oxyCODONE-acetaminophen (PERCOCET/ROXICET) 5-325 MG per tablet Take 1 tablet by mouth every 8 (eight) hours as needed. Patient taking differently: Take 1 tablet by mouth every 8 (eight) hours.  09/30/14  Yes Thurnell Lose, MD  pantoprazole (PROTONIX) 40 MG tablet Take 1 tablet (40 mg total) by mouth daily before breakfast. 10/30/14  Yes Adrian Prows, MD  Polyethyl Glycol-Propyl Glycol (SYSTANE ULTRA OP) Place 1 drop into both eyes as needed (for dry eyes).   Yes Historical Provider, MD  pregabalin (LYRICA) 100 MG capsule Take 100 mg by mouth 3 (three) times daily.   Yes Historical Provider, MD  rOPINIRole (REQUIP) 2 MG tablet Take 2 mg by mouth at bedtime.    Yes Historical Provider, MD  Tamsulosin HCl (FLOMAX) 0.4 MG CAPS Take 0.4 mg by mouth daily.   Yes Historical Provider, MD   Physical Exam: Filed Vitals:   01/14/15 0450  BP:   Pulse:   Temp: 100.2 F (37.9 C)  Resp:     BP 120/58 mmHg  Pulse 83  Temp(Src) 100.2 F (37.9 C) (Oral)  Resp 18  Ht 6' (1.829 m)  Wt 115.9 kg (255 lb 8.2 oz)  BMI 34.65 kg/m2  SpO2 91%  General Appearance:    Alert, oriented, no distress, appears stated age  Head:    Normocephalic, atraumatic  Eyes:    PERRL, EOMI, sclera non-icteric        Nose:   Nares without drainage or epistaxis.  Mucosa, turbinates normal  Throat:   Moist mucous membranes. Oropharynx without erythema or exudate.  Neck:   Supple. No carotid bruits.  No thyromegaly.  No lymphadenopathy.   Back:     No CVA tenderness, no spinal tenderness  Lungs:     Clear to auscultation bilaterally, without wheezes, rhonchi or rales  Chest wall:    No tenderness to palpitation  Heart:    Regular rate and rhythm without murmurs, gallops, rubs  Abdomen:     Soft, non-tender, nondistended, normal bowel sounds, no organomegaly  Genitalia:    deferred  Rectal:    deferred  Extremities:   No clubbing, cyanosis or edema.  Pulses:   2+ and symmetric all extremities  Skin:   Ulcer and abscess on lateral aspect of right foot.  Lymph nodes:   Cervical, supraclavicular, and axillary nodes normal  Neurologic:   CNII-XII intact. Normal strength, sensation and reflexes      throughout    Labs on Admission:  Basic Metabolic Panel:  Recent Labs Lab 01/14/15 0210  NA 134*  K 3.8  CL 99*  CO2 24  GLUCOSE 233*  BUN 16  CREATININE 1.38*  CALCIUM 8.9   Liver Function Tests:  Recent Labs Lab 01/14/15 0210  AST 21  ALT 17  ALKPHOS 114  BILITOT 0.9  PROT 6.7  ALBUMIN 3.7   No results for input(s): LIPASE, AMYLASE in the last 168 hours. No results for input(s): AMMONIA in the last 168 hours. CBC:  Recent Labs Lab 01/14/15 0210  WBC 14.3*  NEUTROABS 12.2*  HGB 10.5*  HCT 30.3*  MCV 83.9  PLT 191   Cardiac Enzymes: No results for input(s): CKTOTAL, CKMB, CKMBINDEX, TROPONINI in the last 168 hours.  BNP (last 3 results) No results for input(s): PROBNP in the last 8760 hours. CBG: No results for input(s): GLUCAP in the last 168 hours.  Radiological Exams on Admission: Dg Chest 2 View  01/14/2015   CLINICAL DATA:  Chills, vomiting and fever.  EXAM: CHEST  2 VIEW  COMPARISON:  Chest radiograph September 26, 2014  FINDINGS: Cardiac silhouette is mildly enlarged, unchanged. Tortuous mildly calcified aorta. Mild  chronic bronchitic change without pleural effusion. Patchy RIGHT middle lobe airspace opacity. No pneumothorax. Cervical spine instrumentation partially imaged. Moderate degenerative change of the thoracic spine.  IMPRESSION: Mild cardiomegaly. Similar bronchitic changes with superimposed patchy RIGHT middle lobe airspace opacity which may reflect atelectasis or possibly pneumonia.   Electronically Signed   By: Elon Alas   On: 01/14/2015 04:07    EKG: Independently reviewed.  Assessment/Plan Principal Problem:   Sepsis Active Problems:   HTN (hypertension)   Type 2 diabetes mellitus with right diabetic foot ulcer   Wound infection   Cellulitis of right foot   Opacity of lung on imaging study   1. Sepsis due to wound infection - patient has no pulmonary symptoms despite the opacity on lung on imaging study, PNA felt to be less likely. 1. Empirically starting zosyn and vanc given sepsis picture with unknown organism (previously had MSSA positive culture in January but I cannot be certain that this is the organism causing today's sepsis 4 months later. 2. EDP to call orthopaedics and inform them of  patients admission to SDU for sepsis, they will need to look at sepsis picture as well as the opacity on CXR (which I believe to be a red herring as noted above), and decide wether patient should go through with surgery today or not. 1. NPO 2. Off of mobic for possible surgery 3. Off of blood thinners (SCDs for DVT ppx) 3. IVF 4. Cultures 2. DM2 - 1. Keeping patient on home lantus 50 units QHS first dose ordered tomorrow night 2. Putting patient on low dose SSI q4h while NPO 3. Holding PO hypoglycemics 4. Holding home apidra 3. HTN - continue home meds    Code Status: Full code  Family Communication: Family at bedside Disposition Plan: Admit to inpatient   Time spent: 70 min  GARDNER, JARED M. [facility] Pager [phone]  If 7AM-7PM, please contact the day team taking  care of the patient [email or web address] Password [email or web address] 01/14/2015, 5:14 AM

## 2015-01-14 NOTE — Op Note (Signed)
   Date of Surgery: 01/14/2015  INDICATIONS: [name] is a 59 y.o.-year-old male who has failed foot salvage intervention and has persistent osteomyelitis ulceration and cellulitis of the right hindfoot.  PREOPERATIVE DIAGNOSIS: Osteomyelitis ulceration cellulitis right hindfoot.  POSTOPERATIVE DIAGNOSIS: Same.  PROCEDURE: Transtibial amputation on the right  SURGEON: [name], M.D.  ANESTHESIA:  general  IV FLUIDS AND URINE: See anesthesia.  ESTIMATED BLOOD LOSS: Minimal mL.  COMPLICATIONS: None.  DESCRIPTION OF PROCEDURE: The patient was brought to the operating room and underwent a general anesthetic. After adequate levels of anesthesia were obtained patient's lower extremity was prepped using DuraPrep draped into a sterile field. A timeout was called.  A transverse incision was made 11 cm distal to the tibial tubercle. This curved proximally and a large posterior flap was created. The tibia was transected 1 cm proximal to the skin incision. The fibula was transected just proximal to the tibial incision. The tibia was beveled anteriorly. A large posterior flap was created. The sciatic nerve was pulled cut and allowed to retract. The vascular bundles were suture ligated with 2-0 silk. The deep and superficial fascial layers were closed using #1 Vicryl. The skin was closed using staples and 2-0 nylon. The wound was covered with Adaptic orthopedic sponges AB dressing Kerlix and Coban. Patient was extubated taken to the PACU in stable condition.  Harold Score, MD [place] 1:03 PM

## 2015-01-14 NOTE — Interval H&P Note (Signed)
History and Physical Interval Note:  01/14/2015 6:31 AM  [name]  has presented today for surgery, with the diagnosis of Osteomyelitis Right Foot  The various methods of treatment have been discussed with the patient and family. After consideration of risks, benefits and other options for treatment, the patient has consented to  Procedure(s): AMPUTATION BELOW KNEE (Right) as a surgical intervention .  The patient's history has been reviewed, patient examined, no change in status, stable for surgery.  I have reviewed the patient's chart and labs.  Questions were answered to the patient's satisfaction.     Anant Agard V

## 2015-01-14 NOTE — Anesthesia Postprocedure Evaluation (Signed)
  Anesthesia Post-op Note  Patient: [name]  Procedure(s) Performed: Procedure(s): AMPUTATION BELOW KNEE (Right)  Patient Location: PACU  Anesthesia Type:MAC  Level of Consciousness: awake  Airway and Oxygen Therapy: Patient Spontanous Breathing  Post-op Pain: none  Post-op Assessment: Post-op Vital signs reviewed, Patient's Cardiovascular Status Stable, Respiratory Function Stable, Patent Airway, No signs of Nausea or vomiting and Pain level controlled  Post-op Vital Signs: Reviewed and stable  Last Vitals:  Filed Vitals:   01/14/15 1330  BP: 98/52  Pulse: 50  Temp: 36.6 C  Resp: 12    Complications: No apparent anesthesia complications

## 2015-01-14 NOTE — Anesthesia Preprocedure Evaluation (Signed)
Anesthesia Evaluation  Patient identified by MRN, date of birth, ID band Patient awake    Reviewed: Allergy & Precautions, H&P , NPO status , Patient's Chart, lab work & pertinent test results  History of Anesthesia Complications Negative for: history of anesthetic complications  Airway Mallampati: I  TM Distance: >3 FB Neck ROM: Full    Dental  (+) Teeth Intact, Dental Advisory Given, Upper Dentures, Edentulous Upper   Pulmonary sleep apnea , COPDCurrent Smoker, former smoker,  breath sounds clear to auscultation        Cardiovascular hypertension, Pt. on medications + angina + Peripheral Vascular Disease Rhythm:Regular Rate:Normal     Neuro/Psych negative neurological ROS  negative psych ROS   GI/Hepatic Neg liver ROS, hiatal hernia, GERD-  Medicated and Controlled,  Endo/Other  diabetes, Well Controlled, Type 2, Oral Hypoglycemic AgentsMorbid obesity  Renal/GU negative Renal ROS     Musculoskeletal  (+) Arthritis -,   Abdominal   Peds  Hematology negative hematology ROS (+)   Anesthesia Other Findings   Reproductive/Obstetrics                             Anesthesia Physical  Anesthesia Plan  ASA: III  Anesthesia Plan: Regional   Post-op Pain Management:    Induction:   Airway Management Planned:   Additional Equipment:   Intra-op Plan:   Post-operative Plan:   Informed Consent: I have reviewed the patients History and Physical, chart, labs and discussed the procedure including the risks, benefits and alternatives for the proposed anesthesia with the patient or authorized representative who has indicated his/her understanding and acceptance.   Dental advisory given  Plan Discussed with: CRNA  Anesthesia Plan Comments:         Anesthesia Quick Evaluation

## 2015-01-14 NOTE — Transfer of Care (Signed)
Immediate Anesthesia Transfer of Care Note  Patient: [name]  Procedure(s) Performed: Procedure(s): AMPUTATION BELOW KNEE (Right)  Patient Location: PACU  Anesthesia Type:MAC  Level of Consciousness: awake, alert  and oriented  Airway & Oxygen Therapy: Patient Spontanous Breathing and Patient connected to nasal cannula oxygen  Post-op Assessment: Report given to RN and Post -op Vital signs reviewed and stable  Post vital signs: Reviewed and stable  Last Vitals:  Filed Vitals:   01/14/15 0900  BP: 118/62  Pulse: 66  Temp:   Resp: 14    Complications: No apparent anesthesia complications

## 2015-01-14 NOTE — ED Notes (Signed)
Admitting at bedside 

## 2015-01-14 NOTE — Progress Notes (Signed)
ANTIBIOTIC CONSULT NOTE - INITIAL  Pharmacy Consult for Vancocin and Zosyn Indication: rule out pneumonia and rule out sepsis  Allergies  Allergen Reactions  . Ivp Dye [Iodinated Diagnostic Agents] Anaphylaxis    Breathing problems  . Adhesive [Tape] Rash    Patient Measurements: Height: 6' (182.9 cm) Weight: 255 lb 8.2 oz (115.9 kg) IBW/kg (Calculated) : 77.6  Vital Signs: Temp: 101.2 F (38.4 C) (05/06 0208) Temp Source: Oral (05/06 0208) BP: 120/58 mmHg (05/06 0430) Pulse Rate: 83 (05/06 0430) Intake/Output from previous day: 05/05 0701 - 05/06 0700 In: 50 [I.V.:50] Out: 375 [Urine:375] Intake/Output from this shift: Total I/O In: 50 [I.V.:50] Out: 375 [Urine:375]  Labs:  Recent Labs  01/14/15 0210  WBC 14.3*  HGB 10.5*  PLT 191  CREATININE 1.38*   Estimated Creatinine Clearance: 74.8 mL/min (by C-G formula based on Cr of 1.38).  Medical History: Past Medical History  Diagnosis Date  . Carotid artery occlusion 11/10/10    LEFT CAROTID ENDARTERECTOMY  . Diabetes mellitus   . Hypertension   . Hyperlipidemia   . COPD (chronic obstructive pulmonary disease)   . DJD (degenerative joint disease)   . GERD (gastroesophageal reflux disease)   . Substernal chest pain   . 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 Slurred speech     AS PER WIFE IN D/C NOTE 11/10/10  . PAD (peripheral artery disease)     Distal aortogram June 2012. Atherectomy left popliteal artery July 2012.   . Barrett's esophagus   . Fatty liver   . Diverticulosis of colon (without mention of hemorrhage)   . Emphysema of lung   . Osteomyelitis   . Complication of anesthesia     BP WENT UP AT [facility] "  . Sleep apnea     has not used CPAP in e years - took self off  . H/O hiatal hernia   . Neuromuscular disorder     peripheral neuropathy  . Full dentures   . Wears glasses      Assessment: 59yo male was scheduled for R foot amputation today but presents to ED w/ cold  chills and vomiting, had been on Keflex for foot infection, CXR concerning for PNA vs atelectasis, concern for sepsis, to begin IV ABX.  Goal of Therapy:  Vancomycin trough level 15-20 mcg/ml  Plan:  Rec'd vanc 1g and Zosyn 3.375g IV in ED; will give additional vanc 1g for total load of 2g followed by 750mg  IV Q12H as well as Zosyn 3.375g IV Q8H and monitor CBC, Cx, levels prn.  Wynona Neat, PharmD, BCPS  01/14/2015,4:44 AM

## 2015-01-14 NOTE — ED Notes (Signed)
Patient transported to X-ray 

## 2015-01-15 DIAGNOSIS — N179 Acute kidney failure, unspecified: Secondary | ICD-10-CM

## 2015-01-15 LAB — GLUCOSE, CAPILLARY
Glucose-Capillary: 138 mg/dL — ABNORMAL HIGH (ref 70–99)
Glucose-Capillary: 156 mg/dL — ABNORMAL HIGH (ref 70–99)
Glucose-Capillary: 174 mg/dL — ABNORMAL HIGH (ref 70–99)
Glucose-Capillary: 180 mg/dL — ABNORMAL HIGH (ref 70–99)
Glucose-Capillary: 222 mg/dL — ABNORMAL HIGH (ref 70–99)
Glucose-Capillary: 224 mg/dL — ABNORMAL HIGH (ref 70–99)
Glucose-Capillary: 234 mg/dL — ABNORMAL HIGH (ref 70–99)
Glucose-Capillary: 238 mg/dL — ABNORMAL HIGH (ref 70–99)

## 2015-01-15 LAB — CBC
HCT: 25.4 % — ABNORMAL LOW (ref 39.0–52.0)
Hemoglobin: 8.7 g/dL — ABNORMAL LOW (ref 13.0–17.0)
MCH: 28.8 pg (ref 26.0–34.0)
MCHC: 34.3 g/dL (ref 30.0–36.0)
MCV: 84.1 fL (ref 78.0–100.0)
Platelets: 173 10*3/uL (ref 150–400)
RBC: 3.02 MIL/uL — ABNORMAL LOW (ref 4.22–5.81)
RDW: 13.8 % (ref 11.5–15.5)
WBC: 8.8 10*3/uL (ref 4.0–10.5)

## 2015-01-15 LAB — URINE CULTURE
Colony Count: NO GROWTH
Culture: NO GROWTH

## 2015-01-15 LAB — BASIC METABOLIC PANEL
Anion gap: 8 (ref 5–15)
BUN: 18 mg/dL (ref 6–20)
CO2: 26 mmol/L (ref 22–32)
Calcium: 8 mg/dL — ABNORMAL LOW (ref 8.9–10.3)
Chloride: 100 mmol/L — ABNORMAL LOW (ref 101–111)
Creatinine, Ser: 1.44 mg/dL — ABNORMAL HIGH (ref 0.61–1.24)
GFR calc Af Amer: 60 mL/min — ABNORMAL LOW (ref >60)
GFR calc non Af Amer: 51 mL/min — ABNORMAL LOW (ref >60)
Glucose, Bld: 187 mg/dL — ABNORMAL HIGH (ref 70–99)
Potassium: 3.4 mmol/L — ABNORMAL LOW (ref 3.5–5.1)
Sodium: 134 mmol/L — ABNORMAL LOW (ref 135–145)

## 2015-01-15 MED ORDER — OXYCODONE HCL 5 MG PO TABS
10.0000 mg | ORAL_TABLET | ORAL | Status: DC | PRN
  Administered 2015-01-18 – 2015-01-20 (×9): 10 mg via ORAL
  Filled 2015-01-15 (×10): qty 2

## 2015-01-15 MED ORDER — POTASSIUM CHLORIDE CRYS ER 20 MEQ PO TBCR
20.0000 meq | EXTENDED_RELEASE_TABLET | Freq: Once | ORAL | Status: AC
Start: 2015-01-15 — End: 2015-01-15
  Administered 2015-01-15: 20 meq via ORAL
  Filled 2015-01-15: qty 1

## 2015-01-15 MED ORDER — CARVEDILOL 25 MG PO TABS
25.0000 mg | ORAL_TABLET | Freq: Every morning | ORAL | Status: DC
  Administered 2015-01-16: 25 mg via ORAL
  Filled 2015-01-15: qty 1

## 2015-01-15 MED ORDER — POLYETHYLENE GLYCOL 3350 17 G PO PACK
17.0000 g | PACK | Freq: Two times a day (BID) | ORAL | Status: DC
  Administered 2015-01-16 – 2015-01-17 (×3): 17 g via ORAL
  Filled 2015-01-15 (×12): qty 1

## 2015-01-15 MED ORDER — ENOXAPARIN SODIUM 40 MG/0.4ML ~~LOC~~ SOLN
40.0000 mg | SUBCUTANEOUS | Status: DC
  Administered 2015-01-16 – 2015-01-20 (×5): 40 mg via SUBCUTANEOUS
  Filled 2015-01-15 (×7): qty 0.4

## 2015-01-15 MED ORDER — IRBESARTAN 150 MG PO TABS
150.0000 mg | ORAL_TABLET | Freq: Every day | ORAL | Status: DC
  Administered 2015-01-15: 150 mg via ORAL
  Filled 2015-01-15 (×2): qty 1

## 2015-01-15 MED ORDER — BISACODYL 10 MG RE SUPP: 10.0000 mg | Freq: Every day | RECTAL | Status: DC | PRN

## 2015-01-15 MED ORDER — FLEET ENEMA 7-19 GM/118ML RE ENEM
1.0000 | ENEMA | Freq: Every day | RECTAL | Status: DC | PRN
  Filled 2015-01-15: qty 1

## 2015-01-15 MED ORDER — AMLODIPINE BESYLATE 5 MG PO TABS
5.0000 mg | ORAL_TABLET | Freq: Every day | ORAL | Status: DC
  Administered 2015-01-15 – 2015-01-19 (×5): 5 mg via ORAL
  Filled 2015-01-15 (×6): qty 1

## 2015-01-15 NOTE — Evaluation (Signed)
Physical Therapy Evaluation Patient Details Name: [name] MRN: [id number] DOB: [birth year] Today's Date: 01/15/2015   History of Present Illness  [name] is a 59 y.o.-year-old male who has failed foot salvage intervention and has persistent osteomyelitis ulceration and cellulitis of the right hindfoot.  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  Clinical Impression  Pt admitted with above diagnosis. Pt currently with functional limitations due to the deficits listed below (see PT Problem List). Pt tolerated OOB to chair well using RW for balance.  Will need Rehab for education prior to d/c home.  PT agrees. Will follow acutely. Pt will benefit from skilled PT to increase their independence and safety with mobility to allow discharge to the venue listed below.      Follow Up Recommendations CIR;Supervision/Assistance - 24 hour    Equipment Recommendations  Other (comment) (TBA)    Recommendations for Other Services Rehab consult     Precautions / Restrictions Precautions Precautions: Fall Restrictions Weight Bearing Restrictions: No      Mobility  Bed Mobility Overal bed mobility: Needs Assistance Bed Mobility: Supine to Sit     Supine to sit: Supervision        Transfers Overall transfer level: Needs assistance Equipment used: Rolling walker (2 wheeled) Transfers: Sit to/from [facility] Sit to Stand: Mod assist;From elevated surface Stand pivot transfers: Mod assist;From elevated surface       General transfer comment: Pt able to take pivotal steps to recliner from bed using RW.  Fairly steady but did have a lateral lean to right.  Pt fell asleep a few time while pt was talking to him. Slightly lethargic.    Ambulation/Gait                Stairs            Wheelchair Mobility    Modified Rankin (Stroke Patients Only)       Balance Overall balance assessment: Needs assistance;History of Falls Sitting-balance support: No upper extremity  supported (foot supported) Sitting balance-Leahy Scale: Fair Sitting balance - Comments: can sit EOB without physical assist. can withstand min challenges to balance. Postural control: Right lateral lean Standing balance support: Bilateral upper extremity supported;During functional activity Standing balance-Leahy Scale: Poor Standing balance comment: right lateral lean with RW.  Needed UE support and steadying assist.                             Pertinent Vitals/Pain Pain Assessment: 0-10 Pain Score: 10-Worst pain ever Pain Location: right residual limb Pain Descriptors / Indicators: Aching;Sore;Pressure;Throbbing Pain Intervention(s): Limited activity within patient's tolerance;Monitored during session;Premedicated before session;Repositioned  VSS.  93% on RA with BP 94/55 initially and 136/62 after gettting in chair.      Home Living Family/patient expects to be discharged to:: Private residence Living Arrangements: Spouse/significant other Available Help at Discharge: Family;Available 24 hours/day (wife and stepson) Type of Home: Mobile home Home Access: Ramped entrance     Home Layout: One level Home Equipment: Crutches;Walker - 2 wheels;Wheelchair - manual;Shower seat      Prior Function Level of Independence: Independent         Comments: disabled     Hand Dominance   Dominant Hand: Right    Extremity/Trunk Assessment   Upper Extremity Assessment: Defer to OT evaluation           Lower Extremity Assessment: Generalized weakness;RLE deficits/detail         Communication  Communication: No difficulties  Cognition Arousal/Alertness: Lethargic Behavior During Therapy: Anxious Overall Cognitive Status: Within Functional Limits for tasks assessed                      General Comments      Exercises General Exercises - Lower Extremity Ankle Circles/Pumps: AROM;Left;5 reps;Supine Heel Slides: AROM;Left;5 reps;Supine Amputee  Exercises Hip Flexion/Marching: AROM;5 reps;Right;Supine      Assessment/Plan    PT Assessment Patient needs continued PT services  PT Diagnosis Generalized weakness   PT Problem List Decreased activity tolerance;Decreased balance;Decreased mobility;Decreased knowledge of use of DME;Decreased safety awareness;Decreased knowledge of precautions;Pain  PT Treatment Interventions DME instruction;Gait training;Functional mobility training;Therapeutic activities;Therapeutic exercise;Balance training;Patient/family education   PT Goals (Current goals can be found in the Care Plan section) Acute Rehab PT Goals Patient Stated Goal: to get better PT Goal Formulation: With patient Time For Goal Achievement: 01/29/15 Potential to Achieve Goals: Good    Frequency Min 3X/week   Barriers to discharge        Co-evaluation               End of Session Equipment Utilized During Treatment: Gait belt;Oxygen Activity Tolerance: Patient limited by fatigue Patient left: in chair;with call bell/phone within reach Nurse Communication: Mobility status;Need for lift equipment         Time: 0914-0940 PT Time Calculation (min) (ACUTE ONLY): 26 min   Charges:   PT Evaluation $Initial PT Evaluation Tier I: 1 Procedure PT Treatments $Therapeutic Activity: 8-22 mins   PT G CodesIrwin Brakeman [name] 2015-01-23, 10:03 AM [name] Acute Rehabilitation [phone] (pager)

## 2015-01-15 NOTE — Progress Notes (Signed)
Patient is stable.  Dressing c/d/i.  Up with PT.  Pain control.  Likely will need placement into facility.  Azucena Cecil, MD [place] 9:23 AM

## 2015-01-15 NOTE — Progress Notes (Signed)
PATIENT DETAILS Name: [name] Age: 59 y.o. Sex: male Date of Birth: Apr 26, 1956 Admit Date: 01/14/2015 Admitting Physician Etta Quill, DO [id number] DENNIS, MD  Subjective: Pain at the operative site-otherwise doing well  Assessment/Plan: Sepsis: Secondary to right foot ulcer with osteomyelitis, not sure if ?PNA (asymptomatic-but seen on Xray). 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. Sepsis pathophysiology has resolved Blood cultures negative. Continue Zosyn and Vanc for another 24 hours. Wound care issues per orthopedics  Active Problems:  Leukocytosis: secondary to above. Resolved with antibiotics and amputation.   ? [id number] seen on CXR-but patient without symptoms. On broad spectrum Abx-that should cover  CAD-s/p recent PCI of RCA in Feb 2016:Continue Aspirin, Plavix, statin and Coreg. Spoke with Dr Marcellina Millin to proceed with surgery-no complications.   HTN (hypertension): Controlled, continue Coreg-but we will decrease dosage (BP soft), Avapro, Amlodipine and Imdur    Mild acute kidney injury: Likely prerenal, cautiously continue with Avapro for now. Repeat electrolytes    Anemia: Mild drop in hemoglobin, suspect from acute illness/mild perioperative blood loss. Follow   Type 2 diabetes mellitus: CBG's stable, continue Lantus and SSI. Follow   BPH: Continue Flomax   Diabetic peripheral neuropathy: Continue Lyrica   Dyslipidemia: continue statin   PAD: (H/O Left popliteal artery atherectomy in June 2012. Right below knee angioplasty in 2013 and left below knee PTA 2014 by Dr. Brunetta Jeans at [place]).Continue Plavix, aspirin and statin for secondary prevention.   Hx of Carotid artery occulusion s/p left carotid endarterectomy  Disposition: Remain inpatient-transfer to telemetry, suspect may need SNF on discharge  Antimicrobial agents  See below  Anti-infectives    Start     Dose/Rate Route Frequency  Ordered Stop   01/14/15 1600  vancomycin (VANCOCIN) IVPB 750 mg/150 ml premix     750 mg 150 mL/hr over 60 Minutes Intravenous Every 12 hours 01/14/15 0452     01/14/15 1049  ceFAZolin (ANCEF) 2-3 GM-% IVPB SOLR    Comments:  Sammuel Cooper   : cabinet override      01/14/15 1049 01/14/15 2259   01/14/15 1000  piperacillin-tazobactam (ZOSYN) IVPB 3.375 g     3.375 g 12.5 mL/hr over 240 Minutes Intravenous Every 8 hours 01/14/15 0452     01/14/15 0500  vancomycin (VANCOCIN) IVPB 1000 mg/200 mL premix     1,000 mg 200 mL/hr over 60 Minutes Intravenous  Once 01/14/15 0452 01/14/15 0739   01/14/15 0300  vancomycin (VANCOCIN) IVPB 1000 mg/200 mL premix     1,000 mg 200 mL/hr over 60 Minutes Intravenous  Once 01/14/15 0246 01/14/15 0526   01/14/15 0300  piperacillin-tazobactam (ZOSYN) IVPB 3.375 g     3.375 g 100 mL/hr over 30 Minutes Intravenous  Once 01/14/15 0246 01/14/15 0354      DVT Prophylaxis: Prophylactic Lovenox   Code Status: Full code  Family Communication None at bedside  Procedures: Right Transtibial amputation 5/6  CONSULTS:  orthopedic surgery  Time spent 35 minutes-which includes 50% of the time with face-to-face with patient/ family and coordinating care related to the above assessment and plan.  MEDICATIONS: Scheduled Meds: . ALPRAZolam  0.25 mg Oral q morning - 10a  . irbesartan  150 mg Oral QHS   And  . amLODipine  5 mg Oral QHS  . aspirin EC  81 mg Oral Daily  . atorvastatin  40 mg Oral Daily  . carvedilol  37.5 mg Oral q morning - 10a  . carvedilol  6.25 mg Oral QHS  . clopidogrel  75 mg Oral Daily  . fentaNYL  75 mcg Transdermal Q72H  . hydrALAZINE  25 mg Oral TID  . insulin aspart  0-9 Units Subcutaneous 6 times per day  . insulin glargine  50 Units Subcutaneous QHS  . isosorbide mononitrate  120 mg Oral Daily  . Linaclotide  145 mcg Oral Daily  . pantoprazole  40 mg Oral QAC breakfast  . piperacillin-tazobactam (ZOSYN)  IV  3.375 g  Intravenous Q8H  . pregabalin  100 mg Oral TID  . rOPINIRole  2 mg Oral QHS  . sodium chloride  3 mL Intravenous Q12H  . tamsulosin  0.4 mg Oral Daily  . vancomycin  750 mg Intravenous Q12H   Continuous Infusions: . sodium chloride 50 mL/hr at 01/15/15 0618  . lactated ringers 10 mL/hr at 01/14/15 1134   PRN Meds:.acetaminophen **OR** acetaminophen, HYDROmorphone (DILAUDID) injection, methocarbamol **OR** methocarbamol (ROBAXIN)  IV, metoCLOPramide **OR** metoCLOPramide (REGLAN) injection, ondansetron **OR** ondansetron (ZOFRAN) IV, ondansetron    PHYSICAL EXAM: Vital signs in last 24 hours: Filed Vitals:   01/15/15 0000 01/15/15 0400 01/15/15 0800 01/15/15 1000  BP: 108/52 109/56 94/55 122/45  Pulse: 67  63 64  Temp: 98 F (36.7 C) 99.2 F (37.3 C) 98.7 F (37.1 C)   TempSrc: Oral Oral Oral   Resp: 19 12 12 10   Height:      Weight:      SpO2:  94% 99% 87%    Weight change:  Filed Weights   01/14/15 0441 01/14/15 0548  Weight: 115.9 kg (255 lb 8.2 oz) 115.7 kg (255 lb 1.2 oz)   Body mass index is 32.74 kg/(m^2).   Gen Exam: Awake and alert with clear speech. Neck: Supple, No JVD.   Chest: B/L Clear. No rales or rhonchi  CVS: S1 S2 Regular, no murmurs.  Abdomen: soft, BS +, non tender, non distended.  Extremities: no edema, lower extremities warm to touch. Status post right transtibial amputation-dressing clean/dry Neurologic: Non Focal.   Skin: No Rash.   Wounds: N/A.    Intake/Output from previous day:  Intake/Output Summary (Last 24 hours) at 01/15/15 1127 Last data filed at 01/15/15 0600  Gross per 24 hour  Intake   3105 ml  Output    801 ml  Net   2304 ml     LAB RESULTS: CBC  Recent Labs Lab 01/14/15 0210 01/15/15 0301  WBC 14.3* 8.8  HGB 10.5* 8.7*  HCT 30.3* 25.4*  PLT 191 173  MCV 83.9 84.1  MCH 29.1 28.8  MCHC 34.7 34.3  RDW 13.5 13.8  LYMPHSABS 0.7  --   MONOABS 1.3*  --   EOSABS 0.0  --   BASOSABS 0.0  --     Chemistries    Recent Labs Lab 01/14/15 0210 01/15/15 0301  NA 134* 134*  K 3.8 3.4*  CL 99* 100*  CO2 24 26  GLUCOSE 233* 187*  BUN 16 18  CREATININE 1.38* 1.44*  CALCIUM 8.9 8.0*    CBG:  Recent Labs Lab 01/14/15 0817 01/14/15 1015 01/14/15 1304 01/14/15 1655 01/14/15 2022  GLUCAP 135* 112* 158* 233* 221*    GFR Estimated Creatinine Clearance: 73.8 mL/min (by C-G formula based on Cr of 1.44).  Coagulation profile No results for input(s): INR, PROTIME in the last 168 hours.  Cardiac Enzymes No results for input(s): CKMB, TROPONINI, MYOGLOBIN in the last  168 hours.  Invalid input(s): CK  Invalid input(s): POCBNP No results for input(s): DDIMER in the last 72 hours. No results for input(s): HGBA1C in the last 72 hours. No results for input(s): CHOL, HDL, LDLCALC, TRIG, CHOLHDL, LDLDIRECT in the last 72 hours. No results for input(s): TSH, T4TOTAL, T3FREE, THYROIDAB in the last 72 hours.  Invalid input(s): FREET3 No results for input(s): VITAMINB12, FOLATE, FERRITIN, TIBC, IRON, RETICCTPCT in the last 72 hours. No results for input(s): LIPASE, AMYLASE in the last 72 hours.  Urine Studies No results for input(s): UHGB, CRYS in the last 72 hours.  Invalid input(s): UACOL, UAPR, USPG, UPH, UTP, UGL, UKET, UBIL, UNIT, UROB, ULEU, UEPI, UWBC, URBC, UBAC, CAST, UCOM, BILUA  MICROBIOLOGY: Recent Results (from the past 240 hour(s))  Blood culture (routine x 2)     Status: None (Preliminary result)   Collection Time: 01/14/15  2:10 AM  Result Value Ref Range Status   Specimen Description BLOOD RIGHT ARM  Final   Special Requests BOTTLES DRAWN AEROBIC AND ANAEROBIC 10CC  Final   Culture   Final           BLOOD CULTURE RECEIVED NO GROWTH TO DATE CULTURE WILL BE HELD FOR 5 DAYS BEFORE ISSUING A FINAL NEGATIVE REPORT Note: Culture results may be compromised due to an excessive volume of blood received in culture bottles. Performed at [facility]    Report Status PENDING   Incomplete  Blood culture (routine x 2)     Status: None (Preliminary result)   Collection Time: 01/14/15  2:22 AM  Result Value Ref Range Status   Specimen Description BLOOD LEFT ARM  Final   Special Requests BOTTLES DRAWN AEROBIC ONLY 10CC  Final   Culture   Final           BLOOD CULTURE RECEIVED NO GROWTH TO DATE CULTURE WILL BE HELD FOR 5 DAYS BEFORE ISSUING A FINAL NEGATIVE REPORT Note: Culture results may be compromised due to an excessive volume of blood received in culture bottles. Performed at [facility]    Report Status PENDING  Incomplete  Urine culture     Status: None   Collection Time: 01/14/15  3:46 AM  Result Value Ref Range Status   Specimen Description URINE, RANDOM  Final   Special Requests NONE  Final   Colony Count NO GROWTH Performed at [facility]   Final   Culture NO GROWTH Performed at [facility]   Final   Report Status 01/15/2015 FINAL  Final  MRSA PCR Screening     Status: None   Collection Time: 01/14/15  5:54 AM  Result Value Ref Range Status   MRSA by PCR DUPLICATE TEST ORDERED/CREDITED PENDING [id number] NEGATIVE Final    Comment:        The GeneXpert MRSA Assay (FDA approved for NASAL specimens only), is one component of a comprehensive MRSA colonization surveillance program. It is not intended to diagnose MRSA infection nor to guide or monitor treatment for MRSA infections.   Surgical pcr screen     Status: None   Collection Time: 01/14/15  8:49 AM  Result Value Ref Range Status   MRSA, PCR NEGATIVE NEGATIVE Final   Staphylococcus aureus NEGATIVE NEGATIVE Final    Comment:        The Xpert SA Assay (FDA approved for NASAL specimens in patients over 24 years of age), is one component of a comprehensive surveillance program.  Test performance has been validated by  [facility] for patients greater than or equal to 7 year old. It is not intended to diagnose infection nor to guide or monitor treatment.      RADIOLOGY STUDIES/RESULTS: Dg Chest 2 View  01/14/2015   CLINICAL DATA:  Chills, vomiting and fever.  EXAM: CHEST  2 VIEW  COMPARISON:  Chest radiograph September 26, 2014  FINDINGS: Cardiac silhouette is mildly enlarged, unchanged. Tortuous mildly calcified aorta. Mild chronic bronchitic change without pleural effusion. Patchy RIGHT middle lobe airspace opacity. No pneumothorax. Cervical spine instrumentation partially imaged. Moderate degenerative change of the thoracic spine.  IMPRESSION: Mild cardiomegaly. Similar bronchitic changes with superimposed patchy RIGHT middle lobe airspace opacity which may reflect atelectasis or possibly pneumonia.   Electronically Signed   By: Elon Alas   On: 01/14/2015 04:07    Oren Binet, MD  [facility] Pager:[phone]  If 7PM-7AM, please contact night-coverage [email or web address] Password [email or web address] 01/15/2015, 11:27 AM   LOS: 1 day

## 2015-01-16 DIAGNOSIS — Z89521 Acquired absence of right knee: Secondary | ICD-10-CM

## 2015-01-16 DIAGNOSIS — I10 Essential (primary) hypertension: Secondary | ICD-10-CM

## 2015-01-16 DIAGNOSIS — T798XXS Other early complications of trauma, sequela: Secondary | ICD-10-CM

## 2015-01-16 DIAGNOSIS — L03115 Cellulitis of right lower limb: Secondary | ICD-10-CM

## 2015-01-16 LAB — BASIC METABOLIC PANEL
Anion gap: 8 (ref 5–15)
BUN: 17 mg/dL (ref 6–20)
CO2: 26 mmol/L (ref 22–32)
Calcium: 8.2 mg/dL — ABNORMAL LOW (ref 8.9–10.3)
Chloride: 101 mmol/L (ref 101–111)
Creatinine, Ser: 1.61 mg/dL — ABNORMAL HIGH (ref 0.61–1.24)
GFR calc Af Amer: 52 mL/min — ABNORMAL LOW (ref >60)
GFR calc non Af Amer: 45 mL/min — ABNORMAL LOW (ref >60)
Glucose, Bld: 139 mg/dL — ABNORMAL HIGH (ref 70–99)
Potassium: 3.9 mmol/L (ref 3.5–5.1)
Sodium: 135 mmol/L (ref 135–145)

## 2015-01-16 LAB — CBC
HCT: 23.6 % — ABNORMAL LOW (ref 39.0–52.0)
Hemoglobin: 8.1 g/dL — ABNORMAL LOW (ref 13.0–17.0)
MCH: 29.1 pg (ref 26.0–34.0)
MCHC: 34.3 g/dL (ref 30.0–36.0)
MCV: 84.9 fL (ref 78.0–100.0)
Platelets: 165 10*3/uL (ref 150–400)
RBC: 2.78 MIL/uL — ABNORMAL LOW (ref 4.22–5.81)
RDW: 13.7 % (ref 11.5–15.5)
WBC: 6.3 10*3/uL (ref 4.0–10.5)

## 2015-01-16 LAB — GLUCOSE, CAPILLARY
Glucose-Capillary: 142 mg/dL — ABNORMAL HIGH (ref 70–99)
Glucose-Capillary: 106 mg/dL — ABNORMAL HIGH (ref 70–99)
Glucose-Capillary: 154 mg/dL — ABNORMAL HIGH (ref 70–99)
Glucose-Capillary: 298 mg/dL — ABNORMAL HIGH (ref 70–99)
Glucose-Capillary: 220 mg/dL — ABNORMAL HIGH (ref 70–99)

## 2015-01-16 MED ORDER — CARVEDILOL 12.5 MG PO TABS
12.5000 mg | ORAL_TABLET | Freq: Every morning | ORAL | Status: DC
  Administered 2015-01-17 – 2015-01-18 (×2): 12.5 mg via ORAL
  Filled 2015-01-16 (×2): qty 1

## 2015-01-16 NOTE — Progress Notes (Signed)
Report received from [place], [place]. Patient sitting up in bed watching TV, IVF infusing without problems, drsg to Right BKA CDI. Denies pain or problems at this time, will monitor.

## 2015-01-16 NOTE — Consult Note (Signed)
Physical Medicine and Rehabilitation Consult Reason for Consult: Right BKA Referring Physician: [facility]   HPI: [name] is a 59 y.o. right handed male with history of CAD with stenting, diabetes mellitus and peripheral neuropathy, right diabetic foot ulcer with osteomyelitis. Patient lives with spouse independent with occasional walker prior to admission. Presented 01/14/2015 with nausea vomiting and low-grade fever felt to be secondary to sepsis related to right foot infection. WBC 14,300. 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. Hospital course pain management. Remains on broad-spectrum antibiotics. Acute blood loss anemia 8.1 and monitored. Subcutaneous Lovenox for DVT prophylaxis. Mild elevation in creatinine to 1.38 from baseline 1.6 and monitored. Physical therapy evaluation completed 01/15/2015 with recommendations of physical medicine rehabilitation consult.  Patient states that nobody has tried bathing and dressing with him thus far.  Review of Systems  Constitutional: Positive for fever and malaise/fatigue.  Gastrointestinal: Positive for nausea.  Psychiatric/Behavioral:       Anxiety  All other systems reviewed and are negative.  Past Medical History  Diagnosis Date  . Carotid artery occlusion 11/10/10    LEFT CAROTID ENDARTERECTOMY  . Diabetes mellitus   . Hypertension   . Hyperlipidemia   . COPD (chronic obstructive pulmonary disease)   . DJD (degenerative joint disease)   . GERD (gastroesophageal reflux disease)   . Substernal chest pain   . 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 Slurred speech     AS PER WIFE IN D/C NOTE 11/10/10  . PAD (peripheral artery disease)     Distal aortogram June 2012. Atherectomy left popliteal artery July 2012.   . Barrett's esophagus   . Fatty liver   . Diverticulosis of colon (without mention of  hemorrhage)   . Emphysema of lung   . Osteomyelitis   . Complication of anesthesia     BP WENT UP AT [facility] "  . Sleep apnea     has not used CPAP in e years - took self off  . H/O hiatal hernia   . Neuromuscular disorder     peripheral neuropathy  . Full dentures   . Wears glasses    Past Surgical History  Procedure Laterality Date  . Laminectomy      X 3 LUMBAR AND X 2 CERVICAL SPINE OPERATIONS  . Carotid endarterectomy  11/10/10  . Hernia repair      LEFT INGUINAL AND UMBILICAL REPAIRS  . Anterior fusion cervical spine  02/06/06    C4-5, C5-6, C6-7; SURGEON DR. MAX COHEN  . Penile prosthesis implant  08/14/05    INFRAPUBIC INSERTION OF INFLATABLE PENILE PROSTHESIS; SURGEON DR. Amalia Hailey  . Laparoscopic cholecystectomy w/ cholangiography  11/09/04    SURGEON DR. Luella Cook  . Total knee arthroplasty  07/2002    RIGHT KNEE ; SURGEON  DR. [name] ALSO HAD ARTHROSCOPIC RIGHT KNEE IN  10/2001  . Shoulder arthroscopy    . Cholecystectomy    . Amputation  11/05/2011    Procedure: AMPUTATION RAY;  Surgeon: Wylene Simmer, MD;  Location: [place];  Service: Orthopedics;  Laterality: Right;  Amputation of Right 4&5th Toes  . Femoral artery stent      x6  . Amputation Left 11/26/2012    Procedure: AMPUTATION RAY;  Surgeon: Wylene Simmer, MD;  Location: [place];  Service: Orthopedics;  Laterality: Left;  fourth ray  amputation  . Cardiac catheterization  10/31/04    2009  . Carpal tunnel release Right 10/21/2013    Procedure: RIGHT CARPAL TUNNEL RELEASE;  Surgeon: Wynonia Sours, MD;  Location: [place];  Service: Orthopedics;  Laterality: Right;  . Ulnar nerve transposition Right 10/21/2013    Procedure: RIGHT ELBOW  ULNAR NERVE DECOMPRESSION;  Surgeon: Wynonia Sours, MD;  Location: [place];  Service: Orthopedics;  Laterality: Right;  . Joint replacement Right 2001    Total  . Spine surgery    . Neck surgery    . Esophageal manometry Bilateral 07/19/2014    Procedure:  ESOPHAGEAL MANOMETRY (EM);  Surgeon: Jerene Bears, MD;  Location: [facility];  Service: Gastroenterology;  Laterality: Bilateral;  . Lower extremity angiogram N/A 03/19/2012    Procedure: LOWER EXTREMITY ANGIOGRAM;  Surgeon: Burnell Blanks, MD;  Location: [facility] CATH LAB;  Service: Cardiovascular;  Laterality: N/A;  . Amputation Right 08/27/2014    Procedure: Transmetatarsal Amputation;  Surgeon: Newt Minion, MD;  Location: [place];  Service: Orthopedics;  Laterality: Right;  . Left heart catheterization with coronary angiogram N/A 10/29/2014    Procedure: LEFT HEART CATHETERIZATION WITH CORONARY ANGIOGRAM;  Surgeon: Laverda Page, MD;  Location: [facility] CATH LAB;  Service: Cardiovascular;  Laterality: N/A;  . Percutaneous coronary stent intervention (pci-s) Right 10/29/2014    Procedure: PERCUTANEOUS CORONARY STENT INTERVENTION (PCI-S);  Surgeon: Laverda Page, MD;  Location: [facility] CATH LAB;  Service: Cardiovascular;  Laterality: Right;   Family History  Problem Relation Age of Onset  . Heart disease Father     Before age 16-  CAD, BPG  . Diabetes Father     Amputation  . Cancer Father     PROSTATE  . Hyperlipidemia Father   . Hypertension Father   . Heart attack Father     Triple BPG  . Arthritis      GRANDMOTHER  . Hypertension      OTHER FAMILY MEMBERS  . Colon cancer Brother   . Cancer Brother     Colon  . Diabetes Brother   . Heart disease Brother 37    A-Fib. Before age 42  . Hyperlipidemia Brother   . Hypertension Brother   . Cancer Sister     Breast   Social History:  reports that he has been smoking E-cigarettes.  He has a 17.5 pack-year smoking history. He has never used smokeless tobacco. He reports that he does not drink alcohol or use illicit drugs. Allergies:  Allergies  Allergen Reactions  . Ivp Dye [Iodinated Diagnostic Agents] Anaphylaxis    Breathing problems  . Adhesive [Tape] Rash   Medications Prior to Admission  Medication Sig Dispense Refill    . ALPRAZolam (XANAX) 0.5 MG tablet Take 0.25 mg by mouth every morning.     Marland Kitchen aspirin EC 81 MG tablet Take 81 mg by mouth daily.    Marland Kitchen atorvastatin (LIPITOR) 40 MG tablet Take 40 mg by mouth daily.    . AZOR 5-20 MG per tablet Take 1 tablet by mouth at bedtime.   0  . carvedilol (COREG) 25 MG tablet Take 1.5 tablets (37.5 mg total) by mouth every morning. 270 tablet 1  . carvedilol (COREG) 6.25 MG tablet Take 6.25 mg by mouth at bedtime.    . cephALEXin (KEFLEX) 500 MG capsule Take 1 capsule (500 mg total) by mouth 4 (four) times daily. (Patient taking differently: Take 500 mg by mouth 4 (  four) times daily. For 21 days) 84 capsule 0  . clobetasol (TEMOVATE) 0.05 % external solution Apply 1 application topically 2 (two) times daily.    . clopidogrel (PLAVIX) 75 MG tablet Take 75 mg by mouth daily. 1 tab daily    . clotrimazole-betamethasone (LOTRISONE) cream Apply 1 application topically 2 (two) times daily.  1  . ezetimibe (ZETIA) 10 MG tablet Take 10 mg by mouth every morning.    . fentaNYL (DURAGESIC - DOSED MCG/HR) 50 MCG/HR Place 1 patch onto the skin every 3 (three) days.    . furosemide (LASIX) 80 MG tablet Take 80 mg by mouth daily.    Marland Kitchen glyBURIDE-metformin (GLUCOVANCE) 2.5-500 MG per tablet Take 2 tablets by mouth 2 (two) times daily.     . hydrALAZINE (APRESOLINE) 25 MG tablet Take 1 tablet (25 mg total) by mouth 3 (three) times daily. 90 tablet 1  . HYDROmorphone (DILAUDID) 2 MG tablet Take 1 tablet (2 mg total) by mouth every 4 (four) hours as needed for pain. 30 tablet 0  . insulin glargine (LANTUS) 100 UNIT/ML injection Inject 50 Units into the skin at bedtime.     . insulin glulisine (APIDRA) 100 UNIT/ML injection Inject 10-15 Units into the skin 3 (three) times daily before meals. 10 breakfast 10 lunch and 15 supper    . isosorbide mononitrate (IMDUR) 120 MG 24 hr tablet Take 120 mg by mouth daily.    . Linaclotide (LINZESS) 145 MCG CAPS capsule Take 1 capsule (145 mcg total) by  mouth daily. 30 capsule 2  . meloxicam (MOBIC) 15 MG tablet Take 15 mg by mouth daily.    . Multiple Vitamin (MULTIVITAMIN) tablet Take 1 tablet by mouth daily.    . nitroGLYCERIN (NITROSTAT) 0.4 MG SL tablet Place 1 tablet (0.4 mg total) under the tongue every 5 (five) minutes as needed (as needed for esophageal spasm). 30 tablet 1  . omega-3 acid ethyl esters (LOVAZA) 1 G capsule Take 2,000 mg by mouth 2 (two) times daily.     . ondansetron (ZOFRAN-ODT) 4 MG disintegrating tablet Take 4 mg by mouth 4 (four) times daily as needed for nausea.    Marland Kitchen oxyCODONE-acetaminophen (PERCOCET/ROXICET) 5-325 MG per tablet Take 1 tablet by mouth every 8 (eight) hours as needed. (Patient taking differently: Take 1 tablet by mouth every 8 (eight) hours. ) 30 tablet 0  . pantoprazole (PROTONIX) 40 MG tablet Take 1 tablet (40 mg total) by mouth daily before breakfast. 30 tablet 3  . Polyethyl Glycol-Propyl Glycol (SYSTANE ULTRA OP) Place 1 drop into both eyes as needed (for dry eyes).    . pregabalin (LYRICA) 100 MG capsule Take 100 mg by mouth 3 (three) times daily.    Marland Kitchen rOPINIRole (REQUIP) 2 MG tablet Take 2 mg by mouth at bedtime.     . Tamsulosin HCl (FLOMAX) 0.4 MG CAPS Take 0.4 mg by mouth daily.      Home: Home Living Family/patient expects to be discharged to:: Private residence Living Arrangements: Spouse/significant other Available Help at Discharge: Family, Available 24 hours/day (wife and stepson) Type of Home: Mobile home Home Access: Ramped entrance [place]: One level [place]: [place], Environmental consultant - 2 wheels, Wheelchair - manual, Shower seat  Functional History: Prior Function Level of Independence: Independent Comments: disabled Functional Status:  Mobility: Bed Mobility Overal bed mobility: Needs Assistance Bed Mobility: Supine to Sit Supine to sit: Supervision Transfers Overall transfer level: Needs assistance Equipment used: Rolling walker (2 wheeled) Transfers: Sit to/from  Stand, Stand Pivot Transfers Sit to Stand: Mod assist, From elevated surface Stand pivot transfers: Mod assist, From elevated surface General transfer comment: Pt able to take pivotal steps to recliner from bed using RW.  Fairly steady but did have a lateral lean to right.        ADL:    Cognition: Cognition Overall Cognitive Status: Within Functional Limits for tasks assessed Orientation Level: Oriented to person, Oriented to place, Oriented to time, Oriented to situation Cognition Arousal/Alertness: Lethargic Behavior During Therapy: Anxious Overall Cognitive Status: Within Functional Limits for tasks assessed  Blood pressure 128/50, pulse 56, temperature 99.6 F (37.6 C), temperature source Oral, resp. rate 16, height 6\' 2"  (1.88 m), weight 116.892 kg (257 lb 11.2 oz), SpO2 96 %. Physical Exam  Constitutional: He is oriented to person, place, and time. He appears well-developed.  HENT:  Head: Normocephalic.  Eyes: EOM are normal.  Neck: Normal range of motion. Neck supple. No thyromegaly present.  Cardiovascular: Normal rate and regular rhythm.   Respiratory: Effort normal and breath sounds normal. No respiratory distress.  GI: Soft. Bowel sounds are normal. He exhibits no distension.  Neurological: He is alert and oriented to person, place, and time.  Skin:  BKA site is dressed appropriately tender  Motor strength is 5/5 bilateral deltoid, biceps, triceps,4/5 grip 4/5 left hip flexor and knee extensor 3 minus ankle dorsiflexion plantar flexor toe flexor extensor Right hip flexor 3 minus Decreased sensation to light touch and proprioception in the toes of the left foot, sensation improves at the ankle Intrinsic muscle atrophy in both feet as well as both hands  Results for orders placed or performed during the hospital encounter of 01/14/15 (from the past 24 hour(s))  Glucose, capillary     Status: Abnormal   Collection Time: 01/15/15 12:05 PM  Result Value Ref Range    Glucose-Capillary 238 (H) 70 - 99 mg/dL  Glucose, capillary     Status: Abnormal   Collection Time: 01/15/15  4:46 PM  Result Value Ref Range   Glucose-Capillary 234 (H) 70 - 99 mg/dL  Glucose, capillary     Status: Abnormal   Collection Time: 01/15/15  8:08 PM  Result Value Ref Range   Glucose-Capillary 174 (H) 70 - 99 mg/dL   Comment 1 Notify RN    Comment 2 Document in Chart   Glucose, capillary     Status: Abnormal   Collection Time: 01/15/15 11:47 PM  Result Value Ref Range   Glucose-Capillary 138 (H) 70 - 99 mg/dL  Glucose, capillary     Status: Abnormal   Collection Time: 01/16/15  3:56 AM  Result Value Ref Range   Glucose-Capillary 142 (H) 70 - 99 mg/dL   Comment 1 Notify RN    Comment 2 Document in Chart   CBC     Status: Abnormal   Collection Time: 01/16/15  5:30 AM  Result Value Ref Range   WBC 6.3 4.0 - 10.5 K/uL   RBC 2.78 (L) 4.22 - 5.81 MIL/uL   Hemoglobin 8.1 (L) 13.0 - 17.0 g/dL   HCT 23.6 (L) 39.0 - 52.0 %   MCV 84.9 78.0 - 100.0 fL   MCH 29.1 26.0 - 34.0 pg   MCHC 34.3 30.0 - 36.0 g/dL   RDW 13.7 11.5 - 15.5 %   Platelets 165 150 - 400 K/uL  Basic metabolic panel     Status: Abnormal   Collection Time: 01/16/15  5:30 AM  Result Value Ref Range  Sodium 135 135 - 145 mmol/L   Potassium 3.9 3.5 - 5.1 mmol/L   Chloride 101 101 - 111 mmol/L   CO2 26 22 - 32 mmol/L   Glucose, Bld 139 (H) 70 - 99 mg/dL   BUN 17 6 - 20 mg/dL   Creatinine, Ser 1.61 (H) 0.61 - 1.24 mg/dL   Calcium 8.2 (L) 8.9 - 10.3 mg/dL   GFR calc non Af Amer 45 (L) >60 mL/min   GFR calc Af Amer 52 (L) >60 mL/min   Anion gap 8 5 - 15  Glucose, capillary     Status: Abnormal   Collection Time: 01/16/15  8:26 AM  Result Value Ref Range   Glucose-Capillary 106 (H) 70 - 99 mg/dL   No results found.  Assessment/Plan: Diagnosis: Right BKA for osteomyelitis right foot in a patient with diabetic neuropathy 1. Does the need for close, 24 hr/day medical supervision in concert with the  patient's rehab needs make it unreasonable for this patient to be served in a less intensive setting? Yes 2. Co-Morbidities requiring supervision/potential complications: Acute blood loss anemia, coronary artery disease status post stenting 3. Due to bladder management, bowel management, safety, skin/wound care, disease management, medication administration, pain management and patient education, does the patient require 24 hr/day rehab nursing? Yes 4. Does the patient require coordinated care of a physician, rehab nurse, PT (1-2 hrs/day, 5 days/week) and OT (1-2 hrs/day, 5 days/week) to address physical and functional deficits in the context of the above medical diagnosis(es)? Yes Addressing deficits in the following areas: balance, endurance, locomotion, strength, transferring, bowel/bladder control, bathing, dressing and toileting 5. Can the patient actively participate in an intensive therapy program of at least 3 hrs of therapy per day at least 5 days per week? Yes 6. The potential for patient to make measurable gains while on inpatient rehab is good 7. Anticipated functional outcomes upon discharge from inpatient rehab are modified independent  with PT, modified independent with OT, n/a with SLP. 8. Estimated rehab length of stay to reach the above functional goals is: 7d 9. Does the patient have adequate social supports and living environment to accommodate these discharge functional goals? Yes 10. Anticipated D/C setting: Home 11. Anticipated post D/C treatments: [place] therapy 12. Overall Rehab/Functional Prognosis: good  RECOMMENDATIONS: This patient's condition is appropriate for continued rehabilitative care in the following setting: CIR if still requiring physical assistance with PT today Patient has agreed to participate in recommended program. patient tends to minimize his deficits. He is skeptical of needing to come over to the rehabilitation section Note that insurance prior  authorization may be required for reimbursement for recommended care.  Comment: Admission coordinator to follow-up on PT session today    01/16/2015

## 2015-01-16 NOTE — Progress Notes (Signed)
Pt has not voided throughout this shift. Pt is encouraged to void multiple time with failed attempts. Bladder scan done; 610 cc on bladder scanning. On-call provider Lamar Blinks, NP notified via text page. Awaiting orders.

## 2015-01-16 NOTE — Progress Notes (Signed)
ANTIBIOTIC CONSULT NOTE  Pharmacy Consult for Vancocin and Zosyn Indication: rule out pneumonia and rule out sepsis  Allergies  Allergen Reactions  . Ivp Dye [Iodinated Diagnostic Agents] Anaphylaxis    Breathing problems  . Adhesive [Tape] Rash    Patient Measurements: Height: 6\' 2"  (188 cm) Weight: 257 lb 11.2 oz (116.892 kg) IBW/kg (Calculated) : 82.2  Vital Signs: Temp: 99.6 F (37.6 C) (05/08 0546) Temp Source: Oral (05/08 0546) BP: 130/62 mmHg (05/08 1058) Pulse Rate: 56 (05/08 0546) Intake/Output from previous day: 05/07 0701 - 05/08 0700 In: 600 [P.O.:150; IV Piggyback:450] Out: 750 [Urine:750] Intake/Output from this shift: Total I/O In: 240 [P.O.:240] Out: -   Labs:  Recent Labs  01/14/15 0210 01/15/15 0301 01/16/15 0530  WBC 14.3* 8.8 6.3  HGB 10.5* 8.7* 8.1*  PLT 191 173 165  CREATININE 1.38* 1.44* 1.61*   Estimated Creatinine Clearance: 66.3 mL/min (by C-G formula based on Cr of 1.61).  Medical History: Past Medical History  Diagnosis Date  . Carotid artery occlusion 11/10/10    LEFT CAROTID ENDARTERECTOMY  . Diabetes mellitus   . Hypertension   . Hyperlipidemia   . COPD (chronic obstructive pulmonary disease)   . DJD (degenerative joint disease)   . GERD (gastroesophageal reflux disease)   . Substernal chest pain   . 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 Slurred speech     AS PER WIFE IN D/C NOTE 11/10/10  . PAD (peripheral artery disease)     Distal aortogram June 2012. Atherectomy left popliteal artery July 2012.   . Barrett's esophagus   . Fatty liver   . Diverticulosis of colon (without mention of hemorrhage)   . Emphysema of lung   . Osteomyelitis   . Complication of anesthesia     BP WENT UP AT [facility] "  . Sleep apnea     has not used CPAP in e years - took self off  . H/O hiatal hernia   . Neuromuscular disorder     peripheral neuropathy  . Full dentures   . Wears glasses      Assessment: 59yo  male was scheduled for R foot amputation today but presents to ED w/ cold chills and vomiting, had been on Keflex for foot infection, CXR concerning for PNA vs atelectasis, concern for sepsis.  Infectious Disease: Sepsis/CAP/foot infection - Tmax 100.8, WBC 6.3 improved. Plan to continue broad abx another 24 hours and de-escalate in am if fevers subside.   Scr up slightly to 1.6.  5/6 Vanc >> 5/6 Zosyn >>  5/6 bld x2 - ngtd 5/6 urine - ngF   Goal of Therapy:  Vancomycin trough level 15-20 mcg/ml  Plan:  Continue vancomycin 750 q12h Zosyn 3.375g IV q8h Follow up de-escalation in am  Erin Hearing PharmD., BCPS Clinical Pharmacist Pager [phone] 01/16/2015 12:34 PM

## 2015-01-16 NOTE — Progress Notes (Signed)
PATIENT DETAILS Name: [name] Age: 59 y.o. Sex: male Date of Birth: 1956-04-18 Admit Date: 01/14/2015 Admitting Physician Etta Quill, DO [id number] DENNIS, MD  Subjective: Pain at the operative site. Had urinary retention last night-required in and out cath. Claims he urinated this am.   Assessment/Plan: Sepsis: Secondary to right foot ulcer with osteomyelitis, not sure if ?PNA (asymptomatic-but seen on Xray). 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. Sepsis pathophysiology has resolved Blood cultures negative. Continue Zosyn and Vanc for another 24 hours as low grade fever this am-if no fever will d/c in am . Wound care issues per orthopedics  Active Problems:  Leukocytosis: secondary to above. Resolved with antibiotics and amputation.   ? [id number] seen on CXR-but patient without symptoms. On broad spectrum Abx-that should cover  CAD-s/p recent PCI of RCA in Feb 2016:Continue Aspirin, Plavix, statin and Coreg. Spoke with Dr Marcellina Millin to proceed with surgery-no complications.   HTN (hypertension): Controlled, continue Coreg-but we will decrease dosage (BP soft), Amlodipine and Imdur. Stop Avapro as renal function worsening    Mild acute kidney injury: Euvolemic on exam-?from urinary retention.Follow lytes. Will ask RN to do frequent bladder ultrasound. Continue Flomax    Anemia:  suspect from acute illness/mild perioperative blood loss. Follow for now-may need transfusion   Type 2 diabetes mellitus: CBG's stable, continue Lantus and SSI. Follow   BPH: Continue Flomax   Diabetic peripheral neuropathy: Continue Lyrica   Dyslipidemia: continue statin   PAD: (H/O Left popliteal artery atherectomy in June 2012. Right below knee angioplasty in 2013 and left below knee PTA 2014 by Dr. Brunetta Jeans at [place]).Continue Plavix, aspirin and statin for secondary prevention.   Hx of Carotid artery occulusion s/p left carotid  endarterectomy  Disposition: Remain inpatient-?CIR on discharge  Antimicrobial agents  See below  Anti-infectives    Start     Dose/Rate Route Frequency Ordered Stop   01/14/15 1600  vancomycin (VANCOCIN) IVPB 750 mg/150 ml premix     750 mg 150 mL/hr over 60 Minutes Intravenous Every 12 hours 01/14/15 0452     01/14/15 1049  ceFAZolin (ANCEF) 2-3 GM-% IVPB SOLR    Comments:  Sammuel Cooper   : cabinet override      01/14/15 1049 01/14/15 2259   01/14/15 1000  piperacillin-tazobactam (ZOSYN) IVPB 3.375 g     3.375 g 12.5 mL/hr over 240 Minutes Intravenous Every 8 hours 01/14/15 0452     01/14/15 0500  vancomycin (VANCOCIN) IVPB 1000 mg/200 mL premix     1,000 mg 200 mL/hr over 60 Minutes Intravenous  Once 01/14/15 0452 01/14/15 0739   01/14/15 0300  vancomycin (VANCOCIN) IVPB 1000 mg/200 mL premix     1,000 mg 200 mL/hr over 60 Minutes Intravenous  Once 01/14/15 0246 01/14/15 0526   01/14/15 0300  piperacillin-tazobactam (ZOSYN) IVPB 3.375 g     3.375 g 100 mL/hr over 30 Minutes Intravenous  Once 01/14/15 0246 01/14/15 0354      DVT Prophylaxis: Prophylactic Lovenox   Code Status: Full code  Family Communication None at bedside  Procedures: Right Transtibial amputation 5/6  CONSULTS:  orthopedic surgery  Time spent 25 minutes-which includes 50% of the time with face-to-face with patient/ family and coordinating care related to the above assessment and plan.  MEDICATIONS: Scheduled Meds: . ALPRAZolam  0.25 mg Oral q morning - 10a  . amLODipine  5 mg Oral  QHS  . aspirin EC  81 mg Oral Daily  . atorvastatin  40 mg Oral Daily  . carvedilol  25 mg Oral q morning - 10a  . carvedilol  6.25 mg Oral QHS  . clopidogrel  75 mg Oral Daily  . enoxaparin (LOVENOX) injection  40 mg Subcutaneous Q24H  . fentaNYL  75 mcg Transdermal Q72H  . hydrALAZINE  25 mg Oral TID  . insulin aspart  0-9 Units Subcutaneous 6 times per day  . insulin glargine  50 Units Subcutaneous  QHS  . isosorbide mononitrate  120 mg Oral Daily  . Linaclotide  145 mcg Oral Daily  . pantoprazole  40 mg Oral QAC breakfast  . piperacillin-tazobactam (ZOSYN)  IV  3.375 g Intravenous Q8H  . polyethylene glycol  17 g Oral BID  . pregabalin  100 mg Oral TID  . rOPINIRole  2 mg Oral QHS  . sodium chloride  3 mL Intravenous Q12H  . tamsulosin  0.4 mg Oral Daily  . vancomycin  750 mg Intravenous Q12H   Continuous Infusions: . sodium chloride 10 mL/hr at 01/15/15 1325   PRN Meds:.acetaminophen **OR** acetaminophen, bisacodyl, HYDROmorphone (DILAUDID) injection, methocarbamol **OR** methocarbamol (ROBAXIN)  IV, metoCLOPramide **OR** metoCLOPramide (REGLAN) injection, ondansetron **OR** ondansetron (ZOFRAN) IV, ondansetron, oxyCODONE, sodium phosphate    PHYSICAL EXAM: Vital signs in last 24 hours: Filed Vitals:   01/15/15 2117 01/15/15 2310 01/16/15 0546 01/16/15 1058  BP: 146/59  128/50 130/62  Pulse: 68  56   Temp: 100.8 F (38.2 C) 98.7 F (37.1 C) 99.6 F (37.6 C)   TempSrc: Oral Oral Oral   Resp: 17  16   Height:      Weight:   116.892 kg (257 lb 11.2 oz)   SpO2: 96%  96%     Weight change:  Filed Weights   01/14/15 0441 01/14/15 0548 01/16/15 0546  Weight: 115.9 kg (255 lb 8.2 oz) 115.7 kg (255 lb 1.2 oz) 116.892 kg (257 lb 11.2 oz)   Body mass index is 33.07 kg/(m^2).   Gen Exam: Awake and alert with clear speech. Neck: Supple, No JVD.   Chest: B/L Clear. No rales  CVS: S1 S2 Regular, no murmurs.  Abdomen: soft, BS +, non tender, non distended.  Extremities: no edema, lower extremities warm to touch. Status post right transtibial amputation-dressing clean/dry Neurologic: Non Focal.   Skin: No Rash.   Wounds: N/A.    Intake/Output from previous day:  Intake/Output Summary (Last 24 hours) at 01/16/15 1140 Last data filed at 01/16/15 0918  Gross per 24 hour  Intake    840 ml  Output    750 ml  Net     90 ml     LAB RESULTS: CBC  Recent Labs Lab  01/14/15 0210 01/15/15 0301 01/16/15 0530  WBC 14.3* 8.8 6.3  HGB 10.5* 8.7* 8.1*  HCT 30.3* 25.4* 23.6*  PLT 191 173 165  MCV 83.9 84.1 84.9  MCH 29.1 28.8 29.1  MCHC 34.7 34.3 34.3  RDW 13.5 13.8 13.7  LYMPHSABS 0.7  --   --   MONOABS 1.3*  --   --   EOSABS 0.0  --   --   BASOSABS 0.0  --   --     Chemistries   Recent Labs Lab 01/14/15 0210 01/15/15 0301 01/16/15 0530  NA 134* 134* 135  K 3.8 3.4* 3.9  CL 99* 100* 101  CO2 24 26 26   GLUCOSE 233* 187* 139*  BUN  16 18 17   CREATININE 1.38* 1.44* 1.61*  CALCIUM 8.9 8.0* 8.2*    CBG:  Recent Labs Lab 01/15/15 1646 01/15/15 2008 01/15/15 2347 01/16/15 0356 01/16/15 0826  GLUCAP 234* 174* 138* 142* 106*    GFR Estimated Creatinine Clearance: 66.3 mL/min (by C-G formula based on Cr of 1.61).  Coagulation profile No results for input(s): INR, PROTIME in the last 168 hours.  Cardiac Enzymes No results for input(s): CKMB, TROPONINI, MYOGLOBIN in the last 168 hours.  Invalid input(s): CK  Invalid input(s): POCBNP No results for input(s): DDIMER in the last 72 hours. No results for input(s): HGBA1C in the last 72 hours. No results for input(s): CHOL, HDL, LDLCALC, TRIG, CHOLHDL, LDLDIRECT in the last 72 hours. No results for input(s): TSH, T4TOTAL, T3FREE, THYROIDAB in the last 72 hours.  Invalid input(s): FREET3 No results for input(s): VITAMINB12, FOLATE, FERRITIN, TIBC, IRON, RETICCTPCT in the last 72 hours. No results for input(s): LIPASE, AMYLASE in the last 72 hours.  Urine Studies No results for input(s): UHGB, CRYS in the last 72 hours.  Invalid input(s): UACOL, UAPR, USPG, UPH, UTP, UGL, UKET, UBIL, UNIT, UROB, ULEU, UEPI, UWBC, URBC, UBAC, CAST, UCOM, BILUA  MICROBIOLOGY: Recent Results (from the past 240 hour(s))  Blood culture (routine x 2)     Status: None (Preliminary result)   Collection Time: 01/14/15  2:10 AM  Result Value Ref Range Status   Specimen Description BLOOD RIGHT ARM   Final   Special Requests BOTTLES DRAWN AEROBIC AND ANAEROBIC 10CC  Final   Culture   Final           BLOOD CULTURE RECEIVED NO GROWTH TO DATE CULTURE WILL BE HELD FOR 5 DAYS BEFORE ISSUING A FINAL NEGATIVE REPORT Note: Culture results may be compromised due to an excessive volume of blood received in culture bottles. Performed at [facility]    Report Status PENDING  Incomplete  Blood culture (routine x 2)     Status: None (Preliminary result)   Collection Time: 01/14/15  2:22 AM  Result Value Ref Range Status   Specimen Description BLOOD LEFT ARM  Final   Special Requests BOTTLES DRAWN AEROBIC ONLY 10CC  Final   Culture   Final           BLOOD CULTURE RECEIVED NO GROWTH TO DATE CULTURE WILL BE HELD FOR 5 DAYS BEFORE ISSUING A FINAL NEGATIVE REPORT Note: Culture results may be compromised due to an excessive volume of blood received in culture bottles. Performed at [facility]    Report Status PENDING  Incomplete  Urine culture     Status: None   Collection Time: 01/14/15  3:46 AM  Result Value Ref Range Status   Specimen Description URINE, RANDOM  Final   Special Requests NONE  Final   Colony Count NO GROWTH Performed at [facility]   Final   Culture NO GROWTH Performed at [facility]   Final   Report Status 01/15/2015 FINAL  Final  MRSA PCR Screening     Status: None   Collection Time: 01/14/15  5:54 AM  Result Value Ref Range Status   MRSA by PCR DUPLICATE TEST ORDERED/CREDITED PENDING [id number] NEGATIVE Final    Comment:        The GeneXpert MRSA Assay (FDA approved for NASAL specimens only), is one component of a comprehensive MRSA colonization surveillance program. It is not intended to diagnose MRSA infection nor to guide or monitor treatment for MRSA  infections.   Surgical pcr screen     Status: None   Collection Time: 01/14/15  8:49 AM  Result Value Ref Range Status   MRSA, PCR NEGATIVE NEGATIVE Final   Staphylococcus aureus  NEGATIVE NEGATIVE Final    Comment:        The Xpert SA Assay (FDA approved for NASAL specimens in patients over 79 years of age), is one component of a comprehensive surveillance program.  Test performance has been validated by [facility] for patients greater than or equal to 42 year old. It is not intended to diagnose infection nor to guide or monitor treatment.     RADIOLOGY STUDIES/RESULTS: Dg Chest 2 View  01/14/2015   CLINICAL DATA:  Chills, vomiting and fever.  EXAM: CHEST  2 VIEW  COMPARISON:  Chest radiograph September 26, 2014  FINDINGS: Cardiac silhouette is mildly enlarged, unchanged. Tortuous mildly calcified aorta. Mild chronic bronchitic change without pleural effusion. Patchy RIGHT middle lobe airspace opacity. No pneumothorax. Cervical spine instrumentation partially imaged. Moderate degenerative change of the thoracic spine.  IMPRESSION: Mild cardiomegaly. Similar bronchitic changes with superimposed patchy RIGHT middle lobe airspace opacity which may reflect atelectasis or possibly pneumonia.   Electronically Signed   By: Elon Alas   On: 01/14/2015 04:07    Oren Binet, MD  [facility] Pager:[phone]  If 7PM-7AM, please contact night-coverage [email or web address] Password [email or web address] 01/16/2015, 11:40 AM   LOS: 2 days

## 2015-01-16 NOTE — Progress Notes (Signed)
In and out cath done with 750 cc output.

## 2015-01-17 ENCOUNTER — Encounter (HOSPITAL_COMMUNITY): Payer: Self-pay | Admitting: Orthopedic Surgery

## 2015-01-17 LAB — CBC
HCT: 23 % — ABNORMAL LOW (ref 39.0–52.0)
Hemoglobin: 8 g/dL — ABNORMAL LOW (ref 13.0–17.0)
MCH: 29.1 pg (ref 26.0–34.0)
MCHC: 34.8 g/dL (ref 30.0–36.0)
MCV: 83.6 fL (ref 78.0–100.0)
Platelets: 192 10*3/uL (ref 150–400)
RBC: 2.75 MIL/uL — ABNORMAL LOW (ref 4.22–5.81)
RDW: 13.3 % (ref 11.5–15.5)
WBC: 5.6 10*3/uL (ref 4.0–10.5)

## 2015-01-17 LAB — BASIC METABOLIC PANEL
Anion gap: 7 (ref 5–15)
BUN: 12 mg/dL (ref 6–20)
CO2: 26 mmol/L (ref 22–32)
Calcium: 8.4 mg/dL — ABNORMAL LOW (ref 8.9–10.3)
Chloride: 103 mmol/L (ref 101–111)
Creatinine, Ser: 1.24 mg/dL (ref 0.61–1.24)
GFR calc Af Amer: >60 mL/min (ref >60)
GFR calc non Af Amer: >60 mL/min (ref >60)
Glucose, Bld: 108 mg/dL — ABNORMAL HIGH (ref 70–99)
Potassium: 3.8 mmol/L (ref 3.5–5.1)
Sodium: 136 mmol/L (ref 135–145)

## 2015-01-17 LAB — GLUCOSE, CAPILLARY
Glucose-Capillary: 105 mg/dL — ABNORMAL HIGH (ref 70–99)
Glucose-Capillary: 119 mg/dL — ABNORMAL HIGH (ref 70–99)
Glucose-Capillary: 120 mg/dL — ABNORMAL HIGH (ref 70–99)
Glucose-Capillary: 185 mg/dL — ABNORMAL HIGH (ref 70–99)
Glucose-Capillary: 188 mg/dL — ABNORMAL HIGH (ref 70–99)
Glucose-Capillary: 91 mg/dL (ref 70–99)

## 2015-01-17 MED ORDER — CARVEDILOL 25 MG PO TABS: 12.5000 mg | ORAL_TABLET | Freq: Every morning | ORAL | Status: DC

## 2015-01-17 MED ORDER — LEVOFLOXACIN 750 MG PO TABS
750.0000 mg | ORAL_TABLET | Freq: Every day | ORAL | Status: AC
  Administered 2015-01-17 – 2015-01-18 (×2): 750 mg via ORAL
  Filled 2015-01-17 (×2): qty 1

## 2015-01-17 MED ORDER — AMLODIPINE BESYLATE 5 MG PO TABS: 5.0000 mg | ORAL_TABLET | Freq: Every day | ORAL | Status: DC

## 2015-01-17 MED ORDER — ALPRAZOLAM 0.5 MG PO TABS: 0.2500 mg | ORAL_TABLET | Freq: Every morning | ORAL | Status: DC

## 2015-01-17 MED ORDER — LEVOFLOXACIN 750 MG PO TABS: 750.0000 mg | ORAL_TABLET | Freq: Every day | ORAL | Status: DC

## 2015-01-17 MED ORDER — FENTANYL 50 MCG/HR TD PT72: 50.0000 ug | MEDICATED_PATCH | TRANSDERMAL | Status: DC

## 2015-01-17 MED ORDER — POLYETHYLENE GLYCOL 3350 17 G PO PACK: 17.0000 g | PACK | Freq: Two times a day (BID) | ORAL | Status: DC

## 2015-01-17 MED ORDER — HYDROMORPHONE HCL 2 MG PO TABS: 2.0000 mg | ORAL_TABLET | ORAL | Status: DC | PRN

## 2015-01-17 NOTE — Progress Notes (Signed)
Rehab Admissions Coordinator Note:  Patient was screened by Retta Diones for appropriateness for an Inpatient Acute Rehab Consult.  At this time, an inpatient rehab consult is pending.  I will follow up once consult is completed.  Retta Diones 01/17/2015, 10:43 AM  I can be reached at [phone].

## 2015-01-17 NOTE — Progress Notes (Signed)
Patient ID: [name], male   DOB: [birth year], 59 y.o.   MRN: [id number] Patient resting comfortably this morning. Anticipate patient will need outpatient rehabilitation or inpatient rehabilitation.

## 2015-01-17 NOTE — Progress Notes (Deleted)
Patient ID: [name], male   DOB: [birth year], 59 y.o.   MRN: [id number] Patient is alert and oriented this morning. He has full active extension of his left leg. Patient has no complaints of pain. Anticipate discharge to home today.

## 2015-01-17 NOTE — Progress Notes (Signed)
Inpatient Diabetes Program Recommendations  AACE/ADA: New Consensus Statement on Inpatient Glycemic Control (2013)  Target Ranges:  Prepandial:   less than 140 mg/dL      Peak postprandial:   less than 180 mg/dL (1-2 hours)      Critically ill patients:  140 - 180 mg/dL   Results for [name], [name] (MRN [id number]) as of 01/17/2015 11:25  Ref. Range 01/16/2015 03:56 01/16/2015 08:26 01/16/2015 11:53 01/16/2015 17:00 01/16/2015 20:37 01/17/2015 00:06 01/17/2015 04:09 01/17/2015 07:55  Glucose-Capillary Latest Ref Range: 70-99 mg/dL 142 (H) 106 (H) 154 (H) 298 (H) 220 (H) 119 (H) 105 (H) 91   DM history: DM2 Outpatient diabetes medications: Glucovance 01-999 mg BID, Apidra 10 units with breakfast, Apidra 10 units with lunch, Apidra 15 units with supper, Lantus 50 units QHS Current orders for Inpatient glycemic control: Lantus 50 units QHS, Novolog 0-9 units Q4H  Inpatient Diabetes Program Recommendations Correction (SSI): Note patient will transfer to CIR when bed available. If patient is eating and tolerating diet, please consider changing Novolog correction frequency from Q4H to ACHS. Insulin - Meal Coverage: Note patient will transfer to CIR when bed available. Post prandail glucose is consistently elevated. If patient is eating and tolerating diet, please consider ordering Novolog 5 units TID with meals for meal coverage (in addition to Novolog correction scale).  Thanks, Barnie Alderman, RN, MSN, CCRN, CDE Diabetes Coordinator Inpatient Diabetes Program [phone] (Team Pager from [place] to [place]) [phone] ([facility] office) [phone] [facility] [place] office)

## 2015-01-17 NOTE — Discharge Summary (Addendum)
PATIENT DETAILS Name: [name] Age: 59 y.o. Sex: male Date of Birth: 1955/09/15 MRN: [id number]. Admitting Physician: Etta Quill, DO [id number] DENNIS, MD  Admit Date: 01/14/2015 Discharge date: 01/20/2015  Recommendations for Outpatient Follow-up:  1. Please check CBC and chemistries in 1 week, monitor hemoglobin and renal function. 2. Consider refer to GI work up if persistent anemia. 3. F/u with ortho [name] 4. 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 Carotid artery occlusion 11/10/10    LEFT CAROTID ENDARTERECTOMY  . Diabetes mellitus   . Hypertension   . Hyperlipidemia   . COPD (chronic obstructive pulmonary disease)   . DJD (degenerative joint disease)   . GERD (gastroesophageal reflux disease)   . Substernal chest pain   . 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 Slurred speech     AS PER WIFE IN D/C NOTE 11/10/10  . PAD (peripheral artery disease)     Distal aortogram June 2012. Atherectomy left popliteal artery July 2012.   . Barrett's esophagus   . Fatty liver   . Diverticulosis of colon (without mention of hemorrhage)   . Emphysema of lung   . Osteomyelitis   . Complication of anesthesia     BP WENT UP AT [facility] "  . Sleep apnea     has not used CPAP in e years - took self off  . H/O hiatal hernia   . Neuromuscular disorder     peripheral neuropathy  . Full dentures   . Wears glasses     DISCHARGE MEDICATIONS: Current Discharge Medication List    CONTINUE these medications which have CHANGED   Details  ALPRAZolam (XANAX) 0.5 MG tablet Take 0.5 tablets (0.25 mg total) by mouth every morning. Qty:  10 tablet, Refills: 0    !! carvedilol (COREG) 12.5 MG tablet Take 3 tablets (37.5 mg total) by mouth every morning. Qty: 90 tablet, Refills: 3    clobetasol (TEMOVATE) 0.05 % external solution Apply 1 application topically 2 (two) times daily as needed. Qty: 50 mL, Refills: 0    fentaNYL (DURAGESIC - DOSED MCG/HR) 50 MCG/HR Place 1 patch (50 mcg total) onto the skin every 3 (three) days. Qty: 3 patch, Refills: 0    furosemide (LASIX) 80 MG tablet Take 0.5 tablets (40 mg total) by mouth daily. Qty: 30 tablet, Refills: 0    HYDROmorphone (DILAUDID) 2 MG tablet Take 1 tablet (2 mg total) by mouth every 4 (four) hours as needed. Qty: 20 tablet, Refills: 0    Linaclotide (LINZESS) 145 MCG CAPS capsule Take 1 capsule (145 mcg total) by mouth daily as needed. Qty: 30 capsule, Refills: 2   Associated Diagnoses: Change in bowel habits     !! - Potential duplicate medications found. Please discuss with provider.    CONTINUE these medications which have NOT CHANGED   Details  aspirin EC 81 MG tablet Take 81 mg by mouth daily.    atorvastatin (LIPITOR) 40 MG tablet Take 40 mg by mouth daily.    AZOR 5-20 MG per tablet Take 1 tablet  by mouth at bedtime.  Refills: 0    !! carvedilol (COREG) 6.25 MG tablet Take 6.25 mg by mouth at bedtime.    clopidogrel (PLAVIX) 75 MG tablet Take 75 mg by mouth daily. 1 tab daily    clotrimazole-betamethasone (LOTRISONE) cream Apply 1 application topically 2 (two) times daily. Refills: 1    ezetimibe (ZETIA) 10 MG tablet Take 10 mg by mouth every morning.    glyBURIDE-metformin (GLUCOVANCE) 2.5-500 MG per tablet Take 2 tablets by mouth 2 (two) times daily.     hydrALAZINE (APRESOLINE) 25 MG tablet Take 1 tablet (25 mg total) by mouth 3 (three) times daily. Qty: 90 tablet, Refills: 1    insulin glargine (LANTUS) 100 UNIT/ML injection Inject 50 Units into the skin at bedtime.     insulin glulisine (APIDRA) 100 UNIT/ML injection Inject 10-15 Units  into the skin 3 (three) times daily before meals. 10 breakfast 10 lunch and 15 supper    isosorbide mononitrate (IMDUR) 120 MG 24 hr tablet Take 120 mg by mouth daily.    meloxicam (MOBIC) 15 MG tablet Take 15 mg by mouth daily.    Multiple Vitamin (MULTIVITAMIN) tablet Take 1 tablet by mouth daily.    nitroGLYCERIN (NITROSTAT) 0.4 MG SL tablet Place 1 tablet (0.4 mg total) under the tongue every 5 (five) minutes as needed (as needed for esophageal spasm). Qty: 30 tablet, Refills: 1    omega-3 acid ethyl esters (LOVAZA) 1 G capsule Take 2,000 mg by mouth 2 (two) times daily.     ondansetron (ZOFRAN-ODT) 4 MG disintegrating tablet Take 4 mg by mouth 4 (four) times daily as needed for nausea.    oxyCODONE-acetaminophen (PERCOCET/ROXICET) 5-325 MG per tablet Take 1 tablet by mouth every 8 (eight) hours as needed. Qty: 30 tablet, Refills: 0    pantoprazole (PROTONIX) 40 MG tablet Take 1 tablet (40 mg total) by mouth daily before breakfast. Qty: 30 tablet, Refills: 3    Polyethyl Glycol-Propyl Glycol (SYSTANE ULTRA OP) Place 1 drop into both eyes as needed (for dry eyes).    pregabalin (LYRICA) 100 MG capsule Take 100 mg by mouth 3 (three) times daily.    rOPINIRole (REQUIP) 2 MG tablet Take 2 mg by mouth at bedtime.     Tamsulosin HCl (FLOMAX) 0.4 MG CAPS Take 0.4 mg by mouth daily.     !! - Potential duplicate medications found. Please discuss with provider.    STOP taking these medications     cephALEXin (KEFLEX) 500 MG capsule         ALLERGIES:   Allergies  Allergen Reactions  . Ivp Dye [Iodinated Diagnostic Agents] Anaphylaxis    Breathing problems  . Adhesive [Tape] Rash    BRIEF HPI:  See H&P, Labs, Consult and Test reports for all details in brief, patient is a 59 year old male with history of CAD with recent PCI to his right coronary artery, known history of diabetic foot with osteomyelitis-was planned to have a outpatient right foot amputation-presented to the  hospital with fever, chills and emesis. He was subsequently admitted for further evaluation and treatment  CONSULTATIONS:   orthopedic surgery and CIR cardiology PERTINENT RADIOLOGIC STUDIES: Dg Chest 2 View  01/14/2015   CLINICAL DATA:  Chills, vomiting and fever.  EXAM: CHEST  2 VIEW  COMPARISON:  Chest radiograph September 26, 2014  FINDINGS: Cardiac silhouette is mildly enlarged, unchanged. Tortuous mildly calcified aorta. Mild chronic bronchitic change without pleural effusion. Patchy RIGHT middle lobe airspace opacity. No pneumothorax. Cervical  spine instrumentation partially imaged. Moderate degenerative change of the thoracic spine.  IMPRESSION: Mild cardiomegaly. Similar bronchitic changes with superimposed patchy RIGHT middle lobe airspace opacity which may reflect atelectasis or possibly pneumonia.   Electronically Signed   By: Elon Alas   On: 01/14/2015 04:07     PERTINENT LAB RESULTS: CBC:  Recent Labs  01/20/15 0545  WBC 5.6  HGB 7.8*  HCT 22.7*  PLT 235   CMET CMP     Component Value Date/Time   NA 138 01/20/2015 0545   K 4.4 01/20/2015 0545   CL 104 01/20/2015 0545   CO2 27 01/20/2015 0545   GLUCOSE 123* 01/20/2015 0545   BUN 15 01/20/2015 0545   CREATININE 1.18 01/20/2015 0545   CREATININE 1.00 02/01/2012 1028   CALCIUM 8.5* 01/20/2015 0545   PROT 6.7 01/14/2015 0210   ALBUMIN 3.7 01/14/2015 0210   AST 21 01/14/2015 0210   ALT 17 01/14/2015 0210   ALKPHOS 114 01/14/2015 0210   BILITOT 0.9 01/14/2015 0210   GFRNONAA >60 01/20/2015 0545   GFRNONAA 83 02/01/2012 1028   GFRAA >60 01/20/2015 0545   GFRAA >89 02/01/2012 1028    GFR Estimated Creatinine Clearance: 90.5 mL/min (by C-G formula based on Cr of 1.18). No results for input(s): LIPASE, AMYLASE in the last 72 hours. No results for input(s): CKTOTAL, CKMB, CKMBINDEX, TROPONINI in the last 72 hours. Invalid input(s): POCBNP No results for input(s): DDIMER in the last 72 hours. No results for  input(s): HGBA1C in the last 72 hours. No results for input(s): CHOL, HDL, LDLCALC, TRIG, CHOLHDL, LDLDIRECT in the last 72 hours. No results for input(s): TSH, T4TOTAL, T3FREE, THYROIDAB in the last 72 hours.  Invalid input(s): FREET3 No results for input(s): VITAMINB12, FOLATE, FERRITIN, TIBC, IRON, RETICCTPCT in the last 72 hours. Coags: No results for input(s): INR in the last 72 hours.  Invalid input(s): PT Microbiology: Recent Results (from the past 240 hour(s))  Blood culture (routine x 2)     Status: None   Collection Time: 01/14/15  2:10 AM  Result Value Ref Range Status   Specimen Description BLOOD RIGHT ARM  Final   Special Requests BOTTLES DRAWN AEROBIC AND ANAEROBIC 10CC  Final   Culture   Final    NO GROWTH 5 DAYS Note: Culture results may be compromised due to an excessive volume of blood received in culture bottles. Performed at [facility]    Report Status 01/20/2015 FINAL  Final  Blood culture (routine x 2)     Status: None   Collection Time: 01/14/15  2:22 AM  Result Value Ref Range Status   Specimen Description BLOOD LEFT ARM  Final   Special Requests BOTTLES DRAWN AEROBIC ONLY 10CC  Final   Culture   Final    NO GROWTH 5 DAYS Note: Culture results may be compromised due to an excessive volume of blood received in culture bottles. Performed at [facility]    Report Status 01/20/2015 FINAL  Final  Urine culture     Status: None   Collection Time: 01/14/15  3:46 AM  Result Value Ref Range Status   Specimen Description URINE, RANDOM  Final   Special Requests NONE  Final   Colony Count NO GROWTH Performed at [facility]   Final   Culture NO GROWTH Performed at [facility]   Final   Report Status 01/15/2015 FINAL  Final  MRSA PCR Screening     Status: None  Collection Time: 01/14/15  5:54 AM  Result Value Ref Range Status   MRSA by PCR DUPLICATE TEST ORDERED/CREDITED PENDING [id number] NEGATIVE Final    Comment:          The GeneXpert MRSA Assay (FDA approved for NASAL specimens only), is one component of a comprehensive MRSA colonization surveillance program. It is not intended to diagnose MRSA infection nor to guide or monitor treatment for MRSA infections.   Surgical pcr screen     Status: None   Collection Time: 01/14/15  8:49 AM  Result Value Ref Range Status   MRSA, PCR NEGATIVE NEGATIVE Final   Staphylococcus aureus NEGATIVE NEGATIVE Final    Comment:        The Xpert SA Assay (FDA approved for NASAL specimens in patients over 95 years of age), is one component of a comprehensive surveillance program.  Test performance has been validated by [facility] for patients greater than or equal to 61 year old. It is not intended to diagnose infection nor to guide or monitor treatment.      BRIEF HOSPITAL COURSE:  Sepsis: Secondary to right foot ulcer with osteomyelitis, not sure if ?PNA (asymptomatic-but seen on Xray).treated wtih empiric vancomycin and Zosyn. Underwent transtibial amputation on 5/6 by [name]. Sepsis pathophysiology has resolved Blood cultures continued to be negative. Patient was continued on vancomycin and Zosyn till 5/9 when they were discontinued. Does not have any further fever, significantly improved. Stable for discharge.  Active Problems:  Leukocytosis: secondary to above. Resolved with antibiotics and amputation.   ? [id number] seen on CXR-but patient without symptoms. Treated with vancomycin and Zosyn, finished abx treatment in the hospital.   CAD-s/p recent PCI of RCA in Feb 2016:Continue Aspirin, Plavix, statin and Coreg. cardiology Dr [name] oked to proceed with surgery. Stable on home meds. No chest pain, no sob.   HTN (hypertension): Controlled, continue Coreg (home dose 37.5mg  po qam, 6.25mg  po qpm), Amlodipine and Imdur, Azor,    Avapro was held in the hospital due to impaired renal function. Renal function is back to normal on discharge.    Mild acute kidney injury: Euvolemic on exam-?from urinary retention.did briefly require one time in/out catheterization with drainage of 700 mL of urine. Renal function is back to normal.  Continue Flomax. Restarted lasix as lower dose, Please recheck bmp within one week   Anemia: suspect from acute illness/mild perioperative blood loss. Hemoglobin stable, please recheck CBC in 1 week   Type 2 diabetes mellitus: CBG's stable, continue Lantus and SSI. Please resume oral hypoglycemics in the next few days if CBGs continue to be stable   BPH: Continue Flomax   Diabetic peripheral neuropathy: Continue Lyrica   Dyslipidemia: continue statin   PAD: (H/O Left popliteal artery atherectomy in June 2012. Right below knee angioplasty in 2013 and left below knee PTA 2014 by Dr. Brunetta Jeans at [place]).Continue Plavix, aspirin and statin for secondary prevention.   Hx of Carotid artery occulusion s/p left carotid endarterectomy     Chronic pain syndrome: Will continue with home dose transdermal fentanyl and oral Dilaudid on discharge. Taper as tolerated as outpatient.  TODAY-DAY OF DISCHARGE:  Subjective:   Kam [place] today has no headache,no chest abdominal pain,no new weakness tingling or numbness  Objective:   Blood pressure 130/50, pulse 63, temperature 98.7 F (37.1 C), temperature source Oral, resp. rate 18, height 6\' 2"  (1.88 m), weight 116.892 kg (257 lb 11.2 oz), SpO2 96 %.  Intake/Output Summary (Last 24 hours)  at 01/20/15 1458 Last data filed at 01/20/15 1427  Gross per 24 hour  Intake 609.33 ml  Output   2880 ml  Net -2270.67 ml   Filed Weights   01/14/15 0441 01/14/15 0548 01/16/15 0546  Weight: 115.9 kg (255 lb 8.2 oz) 115.7 kg (255 lb 1.2 oz) 116.892 kg (257 lb 11.2 oz)    Exam Awake Alert, Oriented X3, No new F.N deficits, Normal affect [place].AT,PERRAL Supple Neck,No JVD, No cervical lymphadenopathy appriciated.  Symmetrical Chest wall movement, Good air  movement bilaterally, CTAB RRR,No Gallops,Rubs or new Murmurs, No Parasternal Heave +ve B.Sounds, Abd Soft, Non tender, No organomegaly appriciated, No rebound -guarding or rigidity. No Cyanosis, Clubbing or edema, No new Rash or bruise.  Right lower extremity-bandaged-stump site without soakage  DISCHARGE CONDITION: Stable  DISPOSITION: SNF  DISCHARGE INSTRUCTIONS:    Activity:  As tolerated with Full fall precautions use walker/cane & assistance as needed  Diet recommendation: Diabetic Diet Heart Healthy diet  Discharge Instructions    Call MD for:  redness, tenderness, or signs of infection (pain, swelling, redness, odor or green/yellow discharge around incision site)    Complete by:  As directed      Diet - low sodium heart healthy    Complete by:  As directed      Diet - low sodium heart healthy    Complete by:  As directed      Diet Carb Modified    Complete by:  As directed      Increase activity slowly    Complete by:  As directed      Increase activity slowly    Complete by:  As directed            Follow-up Information    Follow up with Newt Minion, MD On 01/28/2015.   Specialty:  Orthopedic Surgery   Why:  Appointment with [name] is on 01/28/15 at 10:30am   Contact information:   [place] [address] [phone]       Follow up with [name], MD. Schedule an appointment as soon as possible for a visit on 01/25/2015.   Specialty:  Endocrinology   Why:  Appointment with Dr. Wilson Singer is on 01/25/15 at 3:45   Contact information:   [address] [place] [address] [phone]       Follow up with Adrian Prows, MD. Schedule an appointment as soon as possible for a visit on 02/11/2015.   Specialty:  Cardiology   Why:  Appointment with [name] is on 02/11/15 at 1:15   Contact information:   [place] [address] [phone]       Total Time spent on discharge equals 45  minutes.  Signed: Jacquees Gongora 01/20/2015 2:58 PM

## 2015-01-17 NOTE — Progress Notes (Signed)
Occupational Therapy Evaluation Patient Details Name: [name] MRN: [id number] DOB: 1955-10-29 Today's Date: 01/17/2015    History of Present Illness [name] is a 59 y.o.-year-old male who has failed foot salvage intervention and has persistent osteomyelitis ulceration and cellulitis of the right hindfoot.  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   Clinical Impression   Patient independent PTA. Patient currently requires up to mod assist for functional transfers/sit<>stands and self-care tasks. Patient will benefit from acute OT to increase overall independence in the areas of ADLs, functional mobility, and overall safety in order to safely discharge to venue listed below.     Follow Up Recommendations  CIR;Supervision/Assistance - 24 hour    Equipment Recommendations  Other (comment) (TBD next venue of care)    Recommendations for Other Services Rehab consult     Precautions / Restrictions Precautions Precautions: Fall Restrictions Weight Bearing Restrictions: Yes RLE Weight Bearing: Non weight bearing      Mobility Bed Mobility Overal bed mobility: Needs Assistance Bed Mobility: Supine to Sit     Supine to sit: Min guard     General bed mobility comments: use of bed rails to increase independence   Transfers Overall transfer level: Needs assistance Equipment used: Rolling walker (2 wheeled) Transfers: Sit to/from [facility] Sit to Stand: Mod assist Stand pivot transfers: Mod assist       General transfer comment: Cues for safety, hand placement, and technique    Balance Overall balance assessment: Needs assistance Sitting-balance support: No upper extremity supported;Feet unsupported Sitting balance-Leahy Scale: Fair     Standing balance support: Bilateral upper extremity supported;During functional activity Standing balance-Leahy Scale: Poor    ADL Overall ADL's : Needs assistance/impaired Eating/Feeding: Set up;Sitting   Grooming: Min  guard;Sitting   Upper Body Bathing: Min guard;Sitting   Lower Body Bathing: Moderate assistance;Sit to/from stand;Cueing for safety   Upper Body Dressing : Min guard;Sitting   Lower Body Dressing: Moderate assistance;Cueing for safety;Sit to/from stand   Toilet Transfer: Moderate assistance;RW;BSC  General ADL Comments: Patient anxious during this OT eval/treat. Patient with decreased overall awareness or with depression?? Patient able to engage in bed mobility with steady assist and needed occasional min guard assist for dynamic sitting balance EOB. Patient required moderate encouragement to participate in therapy, but overall willing. Patient performed stand pivot transfer > recliner using RW with light mod assist (assistance needed for lift/lower). Cues for safety during session. Education provided regarding recommended CIR for interdisciplanry rehab prior to d/c>home.     Pertinent Vitals/Pain Pain Assessment: 0-10 Pain Score: 10-Worst pain ever ("12") Pain Location: right residual limb "incision site" Pain Descriptors / Indicators: Aching;Sore Pain Intervention(s): Monitored during session;RN gave pain meds during session     Hand Dominance Right   Extremity/Trunk Assessment Upper Extremity Assessment Upper Extremity Assessment: Overall WFL for tasks assessed (can benefit from general strengthening of BUEs)   Lower Extremity Assessment Lower Extremity Assessment: Defer to PT evaluation   Cervical / Trunk Assessment Cervical / Trunk Assessment: Normal   Communication Communication Communication: No difficulties   Cognition Arousal/Alertness: Awake/alert Behavior During Therapy: Anxious Overall Cognitive Status: Within Functional Limits for tasks assessed              Home Living Family/patient expects to be discharged to:: Private residence Living Arrangements: Spouse/significant other Available Help at Discharge: Family;Available 24 hours/day (wife and  stepson) Type of Home: Mobile home Home Access: Ramped entrance     Home Layout: One level  Bathroom Shower/Tub: Gaffer;Door   Bathroom Toilet: Handicapped height     Home Equipment: Crutches;Walker - 2 wheels;Wheelchair - manual;Shower seat   Prior Functioning/Environment Level of Independence: Independent        Comments: disabled    OT Diagnosis: Generalized weakness;Acute pain   OT Problem List: Decreased strength;Decreased activity tolerance;Decreased range of motion;Impaired balance (sitting and/or standing);Decreased coordination;Decreased knowledge of use of DME or AE;Decreased knowledge of precautions;Pain   OT Treatment/Interventions: Self-care/ADL training;Therapeutic exercise;Energy conservation;DME and/or AE instruction;Therapeutic activities;Patient/family education;Balance training    OT Goals(Current goals can be found in the care plan section) Acute Rehab OT Goals Patient Stated Goal: get a prosthesis  OT Goal Formulation: With patient/family Time For Goal Achievement: 01/24/15 Potential to Achieve Goals: Good ADL Goals Pt Will Perform Grooming: with set-up;sitting Pt Will Perform Upper Body Bathing: Independently;sitting Pt Will Perform Lower Body Bathing: with min assist;with adaptive equipment;sit to/from stand Pt Will Perform Upper Body Dressing: Independently;sitting Pt Will Perform Lower Body Dressing: with min assist;with adaptive equipment;sit to/from stand Pt Will Transfer to Toilet: with min assist;bedside commode Pt/caregiver will Perform Home Exercise Program: Increased strength;Both right and left upper extremity;With written HEP provided;With Supervision  OT Frequency: Min 2X/week   Barriers to D/C: None known at this time   End of Session Equipment Utilized During Treatment: Gait belt;Rolling walker  Activity Tolerance: Patient tolerated treatment well Patient left: in chair;with call bell/phone within reach;with  family/visitor present   Time: [id number] OT Time Calculation (min): 28 min Charges:  OT General Charges $OT Visit: 1 Procedure OT Evaluation $Initial OT Evaluation Tier I: 1 Procedure OT Treatments $Self Care/Home Management : 8-22 mins  Jazman Reuter , MS, OTR/L, CLT Pager: [phone]  01/17/2015, 9:26 AM

## 2015-01-17 NOTE — Progress Notes (Signed)
Physical Therapy Treatment Patient Details Name: [name] MRN: [id number] DOB: [birth year] Today's Date: 01/17/2015    History of Present Illness [name] is a 59 y.o.-year-old male who has failed foot salvage intervention and has persistent osteomyelitis ulceration and cellulitis of the right hindfoot.  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    PT Comments    Patient making improvements with mobility and initiated gait today.  Agree with need for CIR prior to return home.  Follow Up Recommendations  CIR;Supervision/Assistance - 24 hour     Equipment Recommendations  Other (comment) (TBD)    Recommendations for Other Services       Precautions / Restrictions Precautions Precautions: Fall Restrictions Weight Bearing Restrictions: No    Mobility  Bed Mobility Overal bed mobility: Needs Assistance Bed Mobility: Supine to Sit     Supine to sit: Min guard     General bed mobility comments: Assist for safety.  Patient using bedrail.  Transfers Overall transfer level: Needs assistance Equipment used: Rolling walker (2 wheeled) Transfers: Sit to/from [facility] Sit to Stand: Mod assist Stand pivot transfers: Mod assist       General transfer comment: Verbal cues for hand placement.  Assist to rise to standing and for balance initially.  Performed x3 from bed.  At end of session, patient performed stand-pivot transfer bed to recliner with mod assist for balance and safety.  Ambulation/Gait Ambulation/Gait assistance: Mod assist;Min assist Ambulation Distance (Feet): 6 Feet (6' x2 with sitting rest break between) Assistive device: Rolling walker (2 wheeled) Gait Pattern/deviations:  (Hop-to)     General Gait Details: Verbal cues for safe use of RW.  Assist to steady during hop with LLE.  Patient able to ambulate 6' x2 with sitting rest break between.     Stairs            Wheelchair Mobility    Modified Rankin (Stroke Patients Only)       Balance            Standing balance support: Bilateral upper extremity supported Standing balance-Leahy Scale: Poor                      Cognition Arousal/Alertness: Awake/alert Behavior During Therapy: WFL for tasks assessed/performed Overall Cognitive Status: Within Functional Limits for tasks assessed                      Exercises      General Comments        Pertinent Vitals/Pain Pain Assessment: 0-10 Pain Score: 9  Pain Location: RLE Pain Descriptors / Indicators: Aching;Sore Pain Intervention(s): Monitored during session;Repositioned;Patient requesting pain meds-RN notified    Home Living                      Prior Function            PT Goals (current goals can now be found in the care plan section) Progress towards PT goals: Progressing toward goals    Frequency  Min 3X/week    PT Plan Current plan remains appropriate    Co-evaluation             End of Session Equipment Utilized During Treatment: Gait belt Activity Tolerance: Patient limited by fatigue;Patient limited by pain Patient left: in chair;with call bell/phone within reach     Time: [id number] PT Time Calculation (min) (ACUTE ONLY): 31 min  Charges:  $Therapeutic Activity: 23-37 mins  G Codes:      Despina Pole 01/17/2015, 3:48 PM Carita Pian. Sanjuana Kava, [place] Pager [phone]

## 2015-01-18 LAB — GLUCOSE, CAPILLARY
Glucose-Capillary: 145 mg/dL — ABNORMAL HIGH (ref 70–99)
Glucose-Capillary: 179 mg/dL — ABNORMAL HIGH (ref 70–99)
Glucose-Capillary: 206 mg/dL — ABNORMAL HIGH (ref 70–99)
Glucose-Capillary: 235 mg/dL — ABNORMAL HIGH (ref 70–99)
Glucose-Capillary: 81 mg/dL (ref 70–99)
Glucose-Capillary: 90 mg/dL (ref 70–99)

## 2015-01-18 MED ORDER — CARVEDILOL 25 MG PO TABS
37.5000 mg | ORAL_TABLET | Freq: Every morning | ORAL | Status: DC
  Administered 2015-01-19 – 2015-01-20 (×2): 37.5 mg via ORAL
  Filled 2015-01-18 (×2): qty 1

## 2015-01-18 NOTE — Progress Notes (Signed)
Rehab admissions - I was able to speak with an insurance case Freight forwarder.  She has sent the clinicals to her Market researcher.  We should have an answer from insurance between 4:30 and 5 pm today regarding possible acute inpatient rehab admission.  I will update all once I have a decision.  Call me for questions.  [phone]

## 2015-01-18 NOTE — Progress Notes (Signed)
PATIENT DETAILS Name: [name] Age: 59 y.o. Sex: male Date of Birth: [birth year] Admit Date: 01/14/2015 Admitting Physician Etta Quill, DO [id number] DENNIS, MD  Subjective: Continues to have Pain at the operative site. Otherwise no major issues overnight  Assessment/Plan: Sepsis: Secondary to right foot ulcer with osteomyelitis, not sure if ?PNA (asymptomatic-but seen on Xray).Started on empiric vancomycin and Zosyn on admission. Underwent transtibial amputation on 5/6 by [name]. Sepsis pathophysiology has resolved Blood cultures continued to be negative. Patient was continued on vancomycin and Zosyn till 5/9 when they were discontinued. Does not have any further fever, significantly improved. Continues to be Stable for discharge to CIR if bed available today.  Active Problems:  Leukocytosis: secondary to above. Resolved with antibiotics and amputation.   ? [id number] seen on CXR-but patient without symptoms. Was on vancomycin and Zosyn since admission,  transitioned to levofloxacin for 2 more days from 5/9-now off levofloxacin  CAD-s/p recent PCI of RCA in Feb 2016:Continue Aspirin, Plavix, statin and Coreg. This M.D. Spoke with Dr Marcellina Millin to proceed with surgery. No major complications post Surgery.   HTN (hypertension): Controlled, continue Coreg (dose adjusted-as BP soft), Amlodipine and Imdur. Avapro held as renal function briefly worsened-resume when able. Renal function is back to normal on discharge.   Mild acute kidney injury: Euvolemic on exam-?from urinary retention.did briefly require one time in/out catheterization with drainage of 700 mL of urine. Renal function is back to normal. Continue Flomax.    Anemia: suspect from acute illness/mild perioperative blood loss. Hemoglobin stable, check periodically.   Type 2 diabetes mellitus: CBG's stable, continue Lantus and SSI.   BPH: Continue Flomax   Diabetic peripheral  neuropathy: Continue Lyrica   Dyslipidemia: continue statin   PAD: (H/O Left popliteal artery atherectomy in June 2012. Right below knee angioplasty in 2013 and left below knee PTA 2014 by Dr. Brunetta Jeans at [place]).Continue Plavix, aspirin and statin for secondary prevention.   Hx of Carotid artery occulusion s/p left carotid endarterectomy   Chronic pain syndrome: Will continue with transdermal fentanyl and oral Dilaudid on discharge.  Disposition: Remain inpatient-CIR on discharge  Antimicrobial agents  See below  Anti-infectives    Start     Dose/Rate Route Frequency Ordered Stop   01/17/15 1100  levofloxacin (LEVAQUIN) tablet 750 mg     750 mg Oral Daily 01/17/15 1016 01/18/15 1052   01/17/15 0000  levofloxacin (LEVAQUIN) 750 MG tablet     750 mg Oral Daily 01/17/15 1016     01/14/15 1600  vancomycin (VANCOCIN) IVPB 750 mg/150 ml premix  Status:  Discontinued     750 mg 150 mL/hr over 60 Minutes Intravenous Every 12 hours 01/14/15 0452 01/17/15 1000   01/14/15 1049  ceFAZolin (ANCEF) 2-3 GM-% IVPB SOLR    Comments:  Sammuel Cooper   : cabinet override      01/14/15 1049 01/14/15 2259   01/14/15 1000  piperacillin-tazobactam (ZOSYN) IVPB 3.375 g  Status:  Discontinued     3.375 g 12.5 mL/hr over 240 Minutes Intravenous Every 8 hours 01/14/15 0452 01/17/15 1000   01/14/15 0500  vancomycin (VANCOCIN) IVPB 1000 mg/200 mL premix     1,000 mg 200 mL/hr over 60 Minutes Intravenous  Once 01/14/15 0452 01/14/15 0739   01/14/15 0300  vancomycin (VANCOCIN) IVPB 1000 mg/200 mL premix     1,000 mg 200 mL/hr over 60 Minutes Intravenous  Once  01/14/15 0246 01/14/15 0526   01/14/15 0300  piperacillin-tazobactam (ZOSYN) IVPB 3.375 g     3.375 g 100 mL/hr over 30 Minutes Intravenous  Once 01/14/15 0246 01/14/15 0354      DVT Prophylaxis: Prophylactic Lovenox   Code Status: Full code  Family Communication Brother at bedside  Procedures: Right Transtibial amputation  5/6  CONSULTS:  orthopedic surgery  Time spent 25 minutes-which includes 50% of the time with face-to-face with patient/ family and coordinating care related to the above assessment and plan.  MEDICATIONS: Scheduled Meds: . ALPRAZolam  0.25 mg Oral q morning - 10a  . amLODipine  5 mg Oral QHS  . aspirin EC  81 mg Oral Daily  . atorvastatin  40 mg Oral Daily  . carvedilol  12.5 mg Oral q morning - 10a  . carvedilol  6.25 mg Oral QHS  . clopidogrel  75 mg Oral Daily  . enoxaparin (LOVENOX) injection  40 mg Subcutaneous Q24H  . fentaNYL  75 mcg Transdermal Q72H  . hydrALAZINE  25 mg Oral TID  . insulin aspart  0-9 Units Subcutaneous 6 times per day  . insulin glargine  50 Units Subcutaneous QHS  . isosorbide mononitrate  120 mg Oral Daily  . Linaclotide  145 mcg Oral Daily  . pantoprazole  40 mg Oral QAC breakfast  . polyethylene glycol  17 g Oral BID  . pregabalin  100 mg Oral TID  . rOPINIRole  2 mg Oral QHS  . sodium chloride  3 mL Intravenous Q12H  . tamsulosin  0.4 mg Oral Daily   Continuous Infusions: . sodium chloride 10 mL/hr at 01/15/15 1325   PRN Meds:.acetaminophen **OR** acetaminophen, bisacodyl, HYDROmorphone (DILAUDID) injection, methocarbamol **OR** methocarbamol (ROBAXIN)  IV, metoCLOPramide **OR** metoCLOPramide (REGLAN) injection, ondansetron **OR** ondansetron (ZOFRAN) IV, ondansetron, oxyCODONE, sodium phosphate    PHYSICAL EXAM: Vital signs in last 24 hours: Filed Vitals:   01/17/15 2130 01/17/15 2253 01/17/15 2350 01/18/15 0436  BP: 146/62 171/63 136/56 141/69  Pulse: 75  69 67  Temp: 98.1 F (36.7 C)   97.7 F (36.5 C)  TempSrc: Oral   Oral  Resp: 17   18  Height:      Weight:      SpO2: 97% 97%  98%    Weight change:  Filed Weights   01/14/15 0441 01/14/15 0548 01/16/15 0546  Weight: 115.9 kg (255 lb 8.2 oz) 115.7 kg (255 lb 1.2 oz) 116.892 kg (257 lb 11.2 oz)   Body mass index is 33.07 kg/(m^2).   Gen Exam: Awake and alert with clear  speech. Not in any distress Neck: Supple, No JVD.   Chest: B/L Clear. No rales or rhonchi CVS: S1 S2 Regular, no murmurs.  Abdomen: soft, BS +, non tender, non distended.  Extremities: no edema, lower extremities warm to touch. Status post right transtibial amputation-dressing clean/dry Neurologic: Non Focal.   Skin: No Rash.   Wounds: N/A.    Intake/Output from previous day:  Intake/Output Summary (Last 24 hours) at 01/18/15 1254 Last data filed at 01/18/15 1101  Gross per 24 hour  Intake    720 ml  Output   2450 ml  Net  -1730 ml     LAB RESULTS: CBC  Recent Labs Lab 01/14/15 0210 01/15/15 0301 01/16/15 0530 01/17/15 0443  WBC 14.3* 8.8 6.3 5.6  HGB 10.5* 8.7* 8.1* 8.0*  HCT 30.3* 25.4* 23.6* 23.0*  PLT 191 173 165 192  MCV 83.9 84.1 84.9 83.6  MCH  29.1 28.8 29.1 29.1  MCHC 34.7 34.3 34.3 34.8  RDW 13.5 13.8 13.7 13.3  LYMPHSABS 0.7  --   --   --   MONOABS 1.3*  --   --   --   EOSABS 0.0  --   --   --   BASOSABS 0.0  --   --   --     Chemistries   Recent Labs Lab 01/14/15 0210 01/15/15 0301 01/16/15 0530 01/17/15 0443  NA 134* 134* 135 136  K 3.8 3.4* 3.9 3.8  CL 99* 100* 101 103  CO2 24 26 26 26   GLUCOSE 233* 187* 139* 108*  BUN 16 18 17 12   CREATININE 1.38* 1.44* 1.61* 1.24  CALCIUM 8.9 8.0* 8.2* 8.4*    CBG:  Recent Labs Lab 01/17/15 2006 01/18/15 0002 01/18/15 0433 01/18/15 0749 01/18/15 1153  GLUCAP 185* 235* 145* 90 81    GFR Estimated Creatinine Clearance: 86.1 mL/min (by C-G formula based on Cr of 1.24).  Coagulation profile No results for input(s): INR, PROTIME in the last 168 hours.  Cardiac Enzymes No results for input(s): CKMB, TROPONINI, MYOGLOBIN in the last 168 hours.  Invalid input(s): CK  Invalid input(s): POCBNP No results for input(s): DDIMER in the last 72 hours. No results for input(s): HGBA1C in the last 72 hours. No results for input(s): CHOL, HDL, LDLCALC, TRIG, CHOLHDL, LDLDIRECT in the last 72  hours. No results for input(s): TSH, T4TOTAL, T3FREE, THYROIDAB in the last 72 hours.  Invalid input(s): FREET3 No results for input(s): VITAMINB12, FOLATE, FERRITIN, TIBC, IRON, RETICCTPCT in the last 72 hours. No results for input(s): LIPASE, AMYLASE in the last 72 hours.  Urine Studies No results for input(s): UHGB, CRYS in the last 72 hours.  Invalid input(s): UACOL, UAPR, USPG, UPH, UTP, UGL, UKET, UBIL, UNIT, UROB, ULEU, UEPI, UWBC, URBC, UBAC, CAST, UCOM, BILUA  MICROBIOLOGY: Recent Results (from the past 240 hour(s))  Blood culture (routine x 2)     Status: None (Preliminary result)   Collection Time: 01/14/15  2:10 AM  Result Value Ref Range Status   Specimen Description BLOOD RIGHT ARM  Final   Special Requests BOTTLES DRAWN AEROBIC AND ANAEROBIC 10CC  Final   Culture   Final           BLOOD CULTURE RECEIVED NO GROWTH TO DATE CULTURE WILL BE HELD FOR 5 DAYS BEFORE ISSUING A FINAL NEGATIVE REPORT Note: Culture results may be compromised due to an excessive volume of blood received in culture bottles. Performed at [facility]    Report Status PENDING  Incomplete  Blood culture (routine x 2)     Status: None (Preliminary result)   Collection Time: 01/14/15  2:22 AM  Result Value Ref Range Status   Specimen Description BLOOD LEFT ARM  Final   Special Requests BOTTLES DRAWN AEROBIC ONLY 10CC  Final   Culture   Final           BLOOD CULTURE RECEIVED NO GROWTH TO DATE CULTURE WILL BE HELD FOR 5 DAYS BEFORE ISSUING A FINAL NEGATIVE REPORT Note: Culture results may be compromised due to an excessive volume of blood received in culture bottles. Performed at [facility]    Report Status PENDING  Incomplete  Urine culture     Status: None   Collection Time: 01/14/15  3:46 AM  Result Value Ref Range Status   Specimen Description URINE, RANDOM  Final   Special Requests NONE  Final  Colony Count NO GROWTH Performed at [facility]   Final   Culture NO  GROWTH Performed at [facility]   Final   Report Status 01/15/2015 FINAL  Final  MRSA PCR Screening     Status: None   Collection Time: 01/14/15  5:54 AM  Result Value Ref Range Status   MRSA by PCR DUPLICATE TEST ORDERED/CREDITED PENDING [id number] NEGATIVE Final    Comment:        The GeneXpert MRSA Assay (FDA approved for NASAL specimens only), is one component of a comprehensive MRSA colonization surveillance program. It is not intended to diagnose MRSA infection nor to guide or monitor treatment for MRSA infections.   Surgical pcr screen     Status: None   Collection Time: 01/14/15  8:49 AM  Result Value Ref Range Status   MRSA, PCR NEGATIVE NEGATIVE Final   Staphylococcus aureus NEGATIVE NEGATIVE Final    Comment:        The Xpert SA Assay (FDA approved for NASAL specimens in patients over 19 years of age), is one component of a comprehensive surveillance program.  Test performance has been validated by [facility] for patients greater than or equal to 85 year old. It is not intended to diagnose infection nor to guide or monitor treatment.     RADIOLOGY STUDIES/RESULTS: Dg Chest 2 View  01/14/2015   CLINICAL DATA:  Chills, vomiting and fever.  EXAM: CHEST  2 VIEW  COMPARISON:  Chest radiograph September 26, 2014  FINDINGS: Cardiac silhouette is mildly enlarged, unchanged. Tortuous mildly calcified aorta. Mild chronic bronchitic change without pleural effusion. Patchy RIGHT middle lobe airspace opacity. No pneumothorax. Cervical spine instrumentation partially imaged. Moderate degenerative change of the thoracic spine.  IMPRESSION: Mild cardiomegaly. Similar bronchitic changes with superimposed patchy RIGHT middle lobe airspace opacity which may reflect atelectasis or possibly pneumonia.   Electronically Signed   By: Elon Alas   On: 01/14/2015 04:07    Oren Binet, MD  [facility] Pager:[phone]  If 7PM-7AM, please contact  night-coverage [email or web address] Password [email or web address] 01/18/2015, 12:54 PM   LOS: 4 days

## 2015-01-18 NOTE — Consult Note (Signed)
CARDIOLOGY CONSULT NOTE  Patient ID: [name] MRN: [id number] DOB/AGE: 1956/07/07 59 y.o.  Admit date: 01/14/2015 Referring Physician  Patient request Primary Physician:  [name], MD Reason for Consultation  Manage hypertension  HPI: [name]  is a 59 y.o. male  With history of known coronary artery disease and recent coronary angioplasty on 10/29/2014 with implantation of 3 long DES into the right coronary artery. He has stage II chronic kidney disease, hypertension, hyperlipidemia and diabetes mellitus which is controlled. He also has diabetic foot ulcer that is slowly healing and is being managed by [name], finally it was decided that due to nonhealing ulceration he needed right below knee amputation.  His blood pressures been uncontrolled, it has been uncontrolled for many years until he recently established with me about 8 months ago or so. Due to changes in his medications, patient's consent, he had called our office and requested that I see the patient and manage his blood pressure. Otherwise he seems to be doing well, he states that his pain in his amputation site is much improved and is controlled, he states that he was able to walk with help of a walker and some help from the physical therapist today up to the door of his room.  He denies any chest pain, shortness of breath, PND or orthopnea. He has not had any fever, chills or rigors.  Past Medical History  Diagnosis Date  . Carotid artery occlusion 11/10/10    LEFT CAROTID ENDARTERECTOMY  . Diabetes mellitus   . Hypertension   . Hyperlipidemia   . COPD (chronic obstructive pulmonary disease)   . DJD (degenerative joint disease)   . GERD (gastroesophageal reflux disease)   . Substernal chest pain   . 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 Slurred speech     AS PER WIFE IN D/C NOTE 11/10/10  . PAD (peripheral artery disease)     Distal aortogram June 2012. Atherectomy left popliteal  artery July 2012.   . Barrett's esophagus   . Fatty liver   . Diverticulosis of colon (without mention of hemorrhage)   . Emphysema of lung   . Osteomyelitis   . Complication of anesthesia     BP WENT UP AT [facility] "  . Sleep apnea     has not used CPAP in e years - took self off  . H/O hiatal hernia   . Neuromuscular disorder     peripheral neuropathy  . Full dentures   . Wears glasses      Past Surgical History  Procedure Laterality Date  . Laminectomy      X 3 LUMBAR AND X 2 CERVICAL SPINE OPERATIONS  . Carotid endarterectomy  11/10/10  . Hernia repair      LEFT INGUINAL AND UMBILICAL REPAIRS  . Anterior fusion cervical spine  02/06/06    C4-5, C5-6, C6-7; SURGEON DR. MAX COHEN  . Penile prosthesis implant  08/14/05    INFRAPUBIC INSERTION OF INFLATABLE PENILE PROSTHESIS; SURGEON DR. Amalia Hailey  . Laparoscopic cholecystectomy w/ cholangiography  11/09/04    SURGEON DR. Luella Cook  . Total knee arthroplasty  07/2002    RIGHT KNEE ; SURGEON  DR. [name] ALSO HAD ARTHROSCOPIC RIGHT KNEE IN  10/2001  . Shoulder arthroscopy    . Cholecystectomy    . Amputation  11/05/2011    Procedure: AMPUTATION RAY;  Surgeon: Wylene Simmer, MD;  Location: [place];  Service: Orthopedics;  Laterality: Right;  Amputation of Right 4&5th Toes  . Femoral artery stent      x6  . Amputation Left 11/26/2012    Procedure: AMPUTATION RAY;  Surgeon: Wylene Simmer, MD;  Location: [place];  Service: Orthopedics;  Laterality: Left;  fourth ray amputation  . Cardiac catheterization  10/31/04    2009  . Carpal tunnel release Right 10/21/2013    Procedure: RIGHT CARPAL TUNNEL RELEASE;  Surgeon: Wynonia Sours, MD;  Location: [place];  Service: Orthopedics;  Laterality: Right;  . Ulnar nerve transposition Right 10/21/2013    Procedure: RIGHT ELBOW  ULNAR NERVE DECOMPRESSION;  Surgeon: Wynonia Sours, MD;  Location: [place];  Service: Orthopedics;  Laterality: Right;  . Joint replacement Right 2001     Total  . Spine surgery    . Neck surgery    . Esophageal manometry Bilateral 07/19/2014    Procedure: ESOPHAGEAL MANOMETRY (EM);  Surgeon: Jerene Bears, MD;  Location: [facility];  Service: Gastroenterology;  Laterality: Bilateral;  . Lower extremity angiogram N/A 03/19/2012    Procedure: LOWER EXTREMITY ANGIOGRAM;  Surgeon: Burnell Blanks, MD;  Location: [facility] CATH LAB;  Service: Cardiovascular;  Laterality: N/A;  . Amputation Right 08/27/2014    Procedure: Transmetatarsal Amputation;  Surgeon: Newt Minion, MD;  Location: [place];  Service: Orthopedics;  Laterality: Right;  . Left heart catheterization with coronary angiogram N/A 10/29/2014    Procedure: LEFT HEART CATHETERIZATION WITH CORONARY ANGIOGRAM;  Surgeon: Laverda Page, MD;  Location: [facility] CATH LAB;  Service: Cardiovascular;  Laterality: N/A;  . Percutaneous coronary stent intervention (pci-s) Right 10/29/2014    Procedure: PERCUTANEOUS CORONARY STENT INTERVENTION (PCI-S);  Surgeon: Laverda Page, MD;  Location: [facility] CATH LAB;  Service: Cardiovascular;  Laterality: Right;  . Amputation Right 01/14/2015    Procedure: AMPUTATION BELOW KNEE;  Surgeon: Newt Minion, MD;  Location: [place];  Service: Orthopedics;  Laterality: Right;     Family History  Problem Relation Age of Onset  . Heart disease Father     Before age 83-  CAD, BPG  . Diabetes Father     Amputation  . Cancer Father     PROSTATE  . Hyperlipidemia Father   . Hypertension Father   . Heart attack Father     Triple BPG  . Arthritis      GRANDMOTHER  . Hypertension      OTHER FAMILY MEMBERS  . Colon cancer Brother   . Cancer Brother     Colon  . Diabetes Brother   . Heart disease Brother 66    A-Fib. Before age 75  . Hyperlipidemia Brother   . Hypertension Brother   . Cancer Sister     Breast     Social History: History   Social History  . Marital Status: Married    Spouse Name: N/A  . Number of Children: N/A  . Years of Education: N/A    Occupational History  . TRUCK DRIVER    Social History Main Topics  . Smoking status: Current Some Day Smoker -- 0.50 packs/day for 35 years    Types: E-cigarettes  . Smokeless tobacco: Never Used     Comment: VAPOR CIGARETTES  . Alcohol Use: No     Comment: ONCE IN A WHILE  . Drug Use: No  . Sexual Activity: Yes    Birth Control/ Protection: Implant   Other Topics Concern  . Not on  file   Social History Narrative   HEAVY SMOKER AND CONTINUES TO SMOKE 1 PPD. DOES NOT EXERCISE REGULARLY.    Wife [phone] Lebron Quam). Has 2 sons and daughter. Still active                 Prescriptions prior to admission  Medication Sig Dispense Refill Last Dose  . aspirin EC 81 MG tablet Take 81 mg by mouth daily.   01/13/2015 at Unknown time  . atorvastatin (LIPITOR) 40 MG tablet Take 40 mg by mouth daily.   01/13/2015 at Unknown time  . AZOR 5-20 MG per tablet Take 1 tablet by mouth at bedtime.   0 01/13/2015 at Unknown time  . carvedilol (COREG) 6.25 MG tablet Take 6.25 mg by mouth at bedtime.   01/13/2015 at 1800  . cephALEXin (KEFLEX) 500 MG capsule Take 1 capsule (500 mg total) by mouth 4 (four) times daily. (Patient taking differently: Take 500 mg by mouth 4 (four) times daily. For 21 days) 84 capsule 0 01/13/2015 at Unknown time  . clobetasol (TEMOVATE) 0.05 % external solution Apply 1 application topically 2 (two) times daily.   01/13/2015 at Unknown time  . clopidogrel (PLAVIX) 75 MG tablet Take 75 mg by mouth daily. 1 tab daily   01/13/2015 at Unknown time  . clotrimazole-betamethasone (LOTRISONE) cream Apply 1 application topically 2 (two) times daily.  1 01/13/2015 at Unknown time  . ezetimibe (ZETIA) 10 MG tablet Take 10 mg by mouth every morning.   01/13/2015 at Unknown time  . furosemide (LASIX) 80 MG tablet Take 80 mg by mouth daily.   01/13/2015 at Unknown time  . glyBURIDE-metformin (GLUCOVANCE) 2.5-500 MG per tablet Take 2 tablets by mouth 2 (two) times daily.    01/13/2015 at Unknown time   . hydrALAZINE (APRESOLINE) 25 MG tablet Take 1 tablet (25 mg total) by mouth 3 (three) times daily. 90 tablet 1 01/13/2015 at Unknown time  . insulin glargine (LANTUS) 100 UNIT/ML injection Inject 50 Units into the skin at bedtime.    01/13/2015 at Unknown time  . insulin glulisine (APIDRA) 100 UNIT/ML injection Inject 10-15 Units into the skin 3 (three) times daily before meals. 10 breakfast 10 lunch and 15 supper   01/13/2015 at Unknown time  . isosorbide mononitrate (IMDUR) 120 MG 24 hr tablet Take 120 mg by mouth daily.   01/13/2015 at Unknown time  . Linaclotide (LINZESS) 145 MCG CAPS capsule Take 1 capsule (145 mcg total) by mouth daily. 30 capsule 2 01/13/2015 at Unknown time  . meloxicam (MOBIC) 15 MG tablet Take 15 mg by mouth daily.   01/13/2015 at Unknown time  . Multiple Vitamin (MULTIVITAMIN) tablet Take 1 tablet by mouth daily.   01/13/2015 at Unknown time  . nitroGLYCERIN (NITROSTAT) 0.4 MG SL tablet Place 1 tablet (0.4 mg total) under the tongue every 5 (five) minutes as needed (as needed for esophageal spasm). 30 tablet 1 unknown  . omega-3 acid ethyl esters (LOVAZA) 1 G capsule Take 2,000 mg by mouth 2 (two) times daily.    01/13/2015 at Unknown time  . ondansetron (ZOFRAN-ODT) 4 MG disintegrating tablet Take 4 mg by mouth 4 (four) times daily as needed for nausea.   01/13/2015 at Unknown time  . oxyCODONE-acetaminophen (PERCOCET/ROXICET) 5-325 MG per tablet Take 1 tablet by mouth every 8 (eight) hours as needed. (Patient taking differently: Take 1 tablet by mouth every 8 (eight) hours. ) 30 tablet 0 01/13/2015 at Unknown time  . pantoprazole (PROTONIX)  40 MG tablet Take 1 tablet (40 mg total) by mouth daily before breakfast. 30 tablet 3 01/13/2015 at Unknown time  . Polyethyl Glycol-Propyl Glycol (SYSTANE ULTRA OP) Place 1 drop into both eyes as needed (for dry eyes).   01/13/2015 at Unknown time  . pregabalin (LYRICA) 100 MG capsule Take 100 mg by mouth 3 (three) times daily.   01/13/2015 at Unknown time   . rOPINIRole (REQUIP) 2 MG tablet Take 2 mg by mouth at bedtime.    01/13/2015 at Unknown time  . Tamsulosin HCl (FLOMAX) 0.4 MG CAPS Take 0.4 mg by mouth daily.   01/13/2015 at Unknown time  . [DISCONTINUED] ALPRAZolam (XANAX) 0.5 MG tablet Take 0.25 mg by mouth every morning.    01/13/2015 at Unknown time  . [DISCONTINUED] carvedilol (COREG) 25 MG tablet Take 1.5 tablets (37.5 mg total) by mouth every morning. 270 tablet 1 01/13/2015 at 0800  . [DISCONTINUED] fentaNYL (DURAGESIC - DOSED MCG/HR) 50 MCG/HR Place 1 patch onto the skin every 3 (three) days.   01/13/2015 at Unknown time  . [DISCONTINUED] HYDROmorphone (DILAUDID) 2 MG tablet Take 1 tablet (2 mg total) by mouth every 4 (four) hours as needed for pain. 30 tablet 0 01/13/2015 at Unknown time     ROS: General: no fevers/chills/night sweats Eyes: no blurry vision, diplopia, or amaurosis ENT: no sore throat or hearing loss Resp: no cough, wheezing, or hemoptysis CV: Has chronic leg  edema and wears support stockings Denies chest pain or palpitations GI: no abdominal pain, nausea, vomiting, diarrhea, or constipation GU: no dysuria, frequency, or hematuria Neuro: no headache, numbness, tingling, or weakness of extremities Musculoskeletal: S/P right BKA Heme: no bleeding, DVT, or easy bruising Endo: no polydipsia or polyuria    Physical Exam: Blood pressure 183/72, pulse 67, temperature 98.8 F (37.1 C), temperature source Oral, resp. rate 16, height 6\' 2"  (1.88 m), weight 116.892 kg (257 lb 11.2 oz), SpO2 100 %.  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 Right BKA in bandages. The lower extremities otherwise have full range of  movements. There is no tenderness, there is 2 plus pitting leg edema on the left. Neurologic: Grossly normal Vascular exam: All pulses are well preserved including the foot.   Labs:   Lab Results  Component Value Date   WBC 5.6 01/17/2015   HGB 8.0* 01/17/2015   HCT 23.0* 01/17/2015   MCV 83.6 01/17/2015   PLT 192 01/17/2015    Recent Labs Lab 01/14/15 0210  01/17/15 0443  NA 134*  < > 136  K 3.8  < > 3.8  CL 99*  < > 103  CO2 24  < > 26  BUN 16  < > 12  CREATININE 1.38*  < > 1.24  CALCIUM 8.9  < > 8.4*  PROT 6.7  --   --   BILITOT 0.9  --   --   ALKPHOS 114  --   --   ALT 17  --   --   AST 21  --   --   GLUCOSE 233*  < > 108*  < > = values in this interval not displayed.  Lipid Panel  No results found for: CHOL, TRIG, HDL, CHOLHDL,  VLDL, LDLCALC  BNP (last 3 results) No results for input(s): BNP in the last 8760 hours.  ProBNP (last 3 results) No results for input(s): PROBNP in the last 8760 hours.  HEMOGLOBIN A1C Lab Results  Component Value Date   HGBA1C 6.5* 09/26/2014   MPG 140* 09/26/2014    Cardiac Panel (last 3 results)  Recent Labs  09/27/14 0839 09/27/14 1425 09/27/14 1957  TROPONINI <0.03 <0.03 <0.03    Lab Results  Component Value Date   CKTOTAL 101 06/27/2012   CKMB 2.2 11/02/2011   TROPONINI <0.03 09/27/2014     TSH No results for input(s): TSH in the last 8760 hours.  EKG: Normal sinus rhythm, left atrial enlargement. Leftward axis, right bundle branch block. LVH.   Echo:  09/27/2014: Moderate LVH, normal LV systolic function. Grade 2 diastolic dysfunction with elevated LA pressure.   Radiology: No results found.  Scheduled Meds: . ALPRAZolam  0.25 mg Oral q morning - 10a  . amLODipine  5 mg Oral QHS  . aspirin EC  81 mg Oral Daily  . atorvastatin  40 mg Oral Daily  . [START ON 01/19/2015] carvedilol  37.5 mg Oral q morning - 10a  . carvedilol  6.25 mg Oral QHS  . clopidogrel  75 mg Oral Daily  . enoxaparin (LOVENOX)  injection  40 mg Subcutaneous Q24H  . fentaNYL  75 mcg Transdermal Q72H  . hydrALAZINE  25 mg Oral TID  . insulin aspart  0-9 Units Subcutaneous 6 times per day  . insulin glargine  50 Units Subcutaneous QHS  . isosorbide mononitrate  120 mg Oral Daily  . Linaclotide  145 mcg Oral Daily  . pantoprazole  40 mg Oral QAC breakfast  . polyethylene glycol  17 g Oral BID  . pregabalin  100 mg Oral TID  . rOPINIRole  2 mg Oral QHS  . sodium chloride  3 mL Intravenous Q12H  . tamsulosin  0.4 mg Oral Daily   Continuous Infusions: . sodium chloride 10 mL/hr at 01/15/15 1325   PRN Meds:.acetaminophen **OR** acetaminophen, bisacodyl, HYDROmorphone (DILAUDID) injection, methocarbamol **OR** methocarbamol (ROBAXIN)  IV, metoCLOPramide **OR** metoCLOPramide (REGLAN) injection, ondansetron **OR** ondansetron (ZOFRAN) IV, ondansetron, oxyCODONE, sodium phosphate  ASSESSMENT AND PLAN:  1. Nonhealing diabetic foot ulcer, status post right BKA 2. Coronary artery disease of the native vessel, history of PTCA and stenting of the entire RCA with implantation of 3 overlapping DES performed on 10/29/2014. 3. Chronic kidney disease, acute renal failure has resolved, patient presently in stage II chronic kidney disease. 4. Hypertension 5. Hyperlipidemia 6. Diabetes mellitus type 2 controlled  Recommendation: Patient's blood pressure has been difficult to control, I have restarted his Coreg back to his usual dose of 37.5 mg in the morning and 6.25 mg in the evening. Otherwise he's on appropriate medications, I will continue to follow him to improve compliance and answer any of his cardiac questions if they were to arise. Patient presently not in any acute decompensated heart failure. Suspect acute renal failure was due to sepsis and relative dehydration.  Adrian Prows, MD 01/18/2015, 6:31 PM [facility]. [place] Pager: [phone] Office: [phone] If no answer Cell [phone]

## 2015-01-18 NOTE — Progress Notes (Signed)
Rehab admissions - I received a call from patient's wife.  She had an additional fax number for me.  I faxed clinicals to that fax.  I have also called additional numbers on patient's card today.  I have been told that [place] is having difficulty with their system and that they have not been able to access their system periodically yesterday and today.  I was told to call back at another time.  I continue to pursue precert with [employer] for this patient.  RC:[id number]

## 2015-01-18 NOTE — Progress Notes (Signed)
Physical Therapy Treatment Patient Details Name: [name] MRN: [id number] DOB: March 09, 1956 Today's Date: 01/18/2015    History of Present Illness [name] is a 59 y.o.-year-old male who has failed foot salvage intervention and has persistent osteomyelitis ulceration and cellulitis of the right hindfoot.  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    PT Comments    Patient progressing with mobility and LE AROM.  Feel CIR level therapies will maximize function in this individual who is active and mobile in community. Reports has crutches, cane, RW and knee scooter at home.  Follow Up Recommendations  CIR;Supervision/Assistance - 24 hour     Equipment Recommendations  None recommended by PT    Recommendations for Other Services       Precautions / Restrictions Precautions Precautions: Fall Restrictions RLE Weight Bearing: Non weight bearing    Mobility  Bed Mobility               General bed mobility comments: up in chair  Transfers Overall transfer level: Needs assistance Equipment used: Rolling walker (2 wheeled)   Sit to Stand: Min assist         General transfer comment: cues for hand placement, assist for safety, balance  Ambulation/Gait Ambulation/Gait assistance: Min assist Ambulation Distance (Feet): 33 Feet Assistive device: Rolling walker (2 wheeled) Gait Pattern/deviations: Step-to pattern     General Gait Details: in room only to door and back; standing momentarily for moving obstacles out of way   Stairs            Wheelchair Mobility    Modified Rankin (Stroke Patients Only)       Balance     Sitting balance-Leahy Scale: Good       Standing balance-Leahy Scale: Poor Standing balance comment: able to let go with one hand, but needs at least one upper extremity support due to BKA                    Cognition Arousal/Alertness: Awake/alert Behavior During Therapy: WFL for tasks assessed/performed Overall Cognitive Status: Within  Functional Limits for tasks assessed                      Exercises General Exercises - Lower Extremity Hip ABduction/ADduction: AROM;Right;10 reps;Standing Hip Flexion/Marching: AROM;Right;10 reps;Standing (hip extension) Other Exercises Other Exercises: standing hamstring curls right x 10 standing Other Exercises: seated armchair push ups x 10    General Comments        Pertinent Vitals/Pain Pain Assessment: Faces Faces Pain Scale: Hurts even more Pain Location: right LE Pain Descriptors / Indicators: Sore Pain Intervention(s): Monitored during session    Home Living                      Prior Function            PT Goals (current goals can now be found in the care plan section) Progress towards PT goals: Progressing toward goals    Frequency  Min 3X/week    PT Plan Current plan remains appropriate    Co-evaluation             End of Session Equipment Utilized During Treatment: Gait belt Activity Tolerance: Patient tolerated treatment well Patient left: in chair;with call bell/phone within reach     Time: 1501-1516 PT Time Calculation (min) (ACUTE ONLY): 15 min  Charges:  $Gait Training: 8-22 mins  G Codes:      WYNN,CYNDI 01/18/2015, 3:25 PM  Magda Kiel, [place] 01/18/2015

## 2015-01-18 NOTE — Progress Notes (Signed)
Rehab admissions - I have a denial from [facility] for acute inpatient rehab admission.  The reason given was no medical necessity.  I will talk to rehab MD to see if we have grounds for a peer to peer appeal.  I will follow up again tomorrow.  Call me for questions.  [id number]

## 2015-01-18 NOTE — Progress Notes (Signed)
Update from [facility], will know if pt accepted by 5:00 and will update MD at that time. CM updated MD.

## 2015-01-18 NOTE — Progress Notes (Signed)
Spoke with Genie RN admission coordinator from [employer], actively processing [facility] with [facility]. No approval at this time. Genie RN stated she would update SW to address SNF disposition as backup.

## 2015-01-18 NOTE — Progress Notes (Signed)
Rehab admissions - Evaluated for possible admission.  I met with patient.  He is moving well with therapy now requiring min to mod assist.  He would like to come to inpatient rehab if approved.  I have faxed information to [facility] yesterday requesting acute inpatient rehab admission.  I have not received any call back from insurance carrier.  I did review patient's insurance cards today and I will try to call additional numbers from his card today.  I will update all once I hear back from insurance carrier.  Call me for questions.  #[phone]

## 2015-01-18 NOTE — PMR Pre-admission (Signed)
PMR Admission Coordinator Pre-Admission Assessment  Patient: [name] is an 59 y.o., male MRN: [id number] DOB: 13-Oct-1955 Height: _0  (188 cm) Weight: 116.892 kg (257 lb 11.2 oz)              Insurance Information HMO:      PPO:       PCP:       IPA:       80/20:       OTHER:   PRIMARY: [employer] [place]      Policy#: [id number]      Subscriber: Jesusita Oka CM Name:                          Phone#:                             Fax#:                          Pre-Cert#:                          Employer: wife works FT Benefits:  Phone #: [phone]     Name: Cherly Anderson. Date: 09/10/14     Deduct: $1000 (met)      Out of Pocket Max: $2000 (met)      Life Max: unlimited CIR: $250 copay/admit and then 90%      SNF: $250 copay/admit and then 90% with 120 days max Outpatient: 60 visits max     Co-Pay: $35/visit Home Health: 90%      Co-Pay: 10% DME: 90%     Co-Pay: 10% Providers: in network  SECONDARY:  Medicare A/B      Policy#: [id number]      Subscriber: [name] CM Name:        Phone#:       Fax#:   Pre-Cert#:        Employer: Disabled/Not employed Benefits:  Phone #:       Name: Art therapist. Date: 01/08/06=A and 12/10/06=B     Deduct: $1288      Out of Pocket Max: None      Life Max: None CIR: 100%      SNF: 100 days Outpatient: 80%     Co-Pay: 20% Home Health: 100%      Co-Pay: none DME: 80%     Co-Pay: 25%  Medicaid Application Date:        Case Manager:   Disability Application Date:        Case Worker:    Emergency Facilities manager Information    Name Relation Home Work Mobile   Mazariego,[name] Spouse [phone]     Current Medical History  Patient Admitting Diagnosis:  R BKA  History of Present Illness: A 59 y.o. right handed male with history of CAD with stenting, diabetes mellitus and peripheral neuropathy, right diabetic foot ulcer with osteomyelitis. Patient lives with spouse independent with occasional  walker prior to admission. Presented 01/14/2015 with nausea vomiting and low-grade fever felt to be secondary to sepsis related to right foot infection. WBC 14,300. 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. Hospital course pain management. Remains on broad-spectrum antibiotics. Acute blood loss anemia 8.1 and monitored. Subcutaneous  Lovenox for DVT prophylaxis. Mild elevation in creatinine to 1.38 from baseline 1.6 and monitored. Physical therapy evaluation completed 01/15/2015 with recommendations of physical medicine rehabilitation consult.    Past Medical History  Past Medical History  Diagnosis Date  . Carotid artery occlusion 11/10/10    LEFT CAROTID ENDARTERECTOMY  . Diabetes mellitus   . Hypertension   . Hyperlipidemia   . COPD (chronic obstructive pulmonary disease)   . DJD (degenerative joint disease)   . GERD (gastroesophageal reflux disease)   . Substernal chest pain   . 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 Slurred speech     AS PER WIFE IN D/C NOTE 11/10/10  . PAD (peripheral artery disease)     Distal aortogram June 2012. Atherectomy left popliteal artery July 2012.   . Barrett's esophagus   . Fatty liver   . Diverticulosis of colon (without mention of hemorrhage)   . Emphysema of lung   . Osteomyelitis   . Complication of anesthesia     BP WENT UP AT [facility] "  . Sleep apnea     has not used CPAP in e years - took self off  . H/O hiatal hernia   . Neuromuscular disorder     peripheral neuropathy  . Full dentures   . Wears glasses     Family History  family history includes Arthritis in an other family member; Cancer in his brother, father, and sister; Colon cancer in his brother; Diabetes in his brother and father; Heart attack in his father; Heart disease in his father; Heart disease (age of onset: 48) in his brother; Hyperlipidemia in  his brother and father; Hypertension in his brother, father, and another family member.  Prior Rehab/Hospitalizations:  No previous inpatient rehab admissions.   Current Medications   Current facility-administered medications:  .  0.9 %  sodium chloride infusion, , Intravenous, Continuous, Jonetta Osgood, MD, Last Rate: 10 mL/hr at 01/15/15 1325 .  acetaminophen (TYLENOL) tablet 650 mg, 650 mg, Oral, Q6H PRN, 650 mg at 01/15/15 2126 **OR** acetaminophen (TYLENOL) suppository 650 mg, 650 mg, Rectal, Q6H PRN, Newt Minion, MD .  ALPRAZolam Duanne Moron) tablet 0.25 mg, 0.25 mg, Oral, q morning - 10a, Jared M Gardner, DO, 0.25 mg at 01/18/15 1053 .  [DISCONTINUED] irbesartan (AVAPRO) tablet 150 mg, 150 mg, Oral, QHS, 150 mg at 01/15/15 2126 **AND** amLODipine (NORVASC) tablet 5 mg, 5 mg, Oral, QHS, Jonetta Osgood, MD, 5 mg at 01/17/15 2310 .  aspirin EC tablet 81 mg, 81 mg, Oral, Daily, Etta Quill, DO, 81 mg at 01/18/15 1052 .  atorvastatin (LIPITOR) tablet 40 mg, 40 mg, Oral, Daily, Etta Quill, DO, 40 mg at 01/18/15 1052 .  bisacodyl (DULCOLAX) suppository 10 mg, 10 mg, Rectal, Daily PRN, Jonetta Osgood, MD .  carvedilol (COREG) tablet 12.5 mg, 12.5 mg, Oral, q morning - 10a, Jonetta Osgood, MD, 12.5 mg at 01/18/15 1052 .  carvedilol (COREG) tablet 6.25 mg, 6.25 mg, Oral, QHS, Jonetta Osgood, MD, 6.25 mg at 01/17/15 2355 .  clopidogrel (PLAVIX) tablet 75 mg, 75 mg, Oral, Daily, Jonetta Osgood, MD, 75 mg at 01/18/15 1052 .  enoxaparin (LOVENOX) injection 40 mg, 40 mg, Subcutaneous, Q24H, Jonetta Osgood, MD, 40 mg at 01/18/15 [phone] .  fentaNYL (DURAGESIC - dosed mcg/hr) patch 75 mcg, 75 mcg, Transdermal, Q72H, Rise Patience, MD, 75 mcg at 01/17/15 2010 .  hydrALAZINE (APRESOLINE)  tablet 25 mg, 25 mg, Oral, TID, Jonetta Osgood, MD, 25 mg at 01/18/15 1052 .  HYDROmorphone (DILAUDID) injection 1 mg, 1 mg, Intravenous, Q3H PRN, Rise Patience, MD, 1 mg at 01/18/15  1135 .  insulin aspart (novoLOG) injection 0-9 Units, 0-9 Units, Subcutaneous, 6 times per day, Etta Quill, DO, 1 Units at 01/18/15 0513 .  insulin glargine (LANTUS) injection 50 Units, 50 Units, Subcutaneous, QHS, Etta Quill, DO, 50 Units at 01/17/15 2311 .  isosorbide mononitrate (IMDUR) 24 hr tablet 120 mg, 120 mg, Oral, Daily, Jonetta Osgood, MD, 120 mg at 01/18/15 1052 .  Linaclotide (LINZESS) capsule 145 mcg, 145 mcg, Oral, Daily, Etta Quill, DO, 145 mcg at 01/18/15 1052 .  methocarbamol (ROBAXIN) tablet 500 mg, 500 mg, Oral, Q6H PRN, 500 mg at 01/15/15 0835 **OR** methocarbamol (ROBAXIN) 500 mg in dextrose 5 % 50 mL IVPB, 500 mg, Intravenous, Q6H PRN, Meridee Score V, MD .  metoCLOPramide (REGLAN) tablet 5-10 mg, 5-10 mg, Oral, Q8H PRN **OR** metoCLOPramide (REGLAN) injection 5-10 mg, 5-10 mg, Intravenous, Q8H PRN, Newt Minion, MD .  ondansetron (ZOFRAN) tablet 4 mg, 4 mg, Oral, Q6H PRN, 4 mg at 01/15/15 0835 **OR** ondansetron (ZOFRAN) injection 4 mg, 4 mg, Intravenous, Q6H PRN, Meridee Score V, MD .  ondansetron (ZOFRAN-ODT) disintegrating tablet 4 mg, 4 mg, Oral, QID PRN, Etta Quill, DO .  oxyCODONE (Oxy IR/ROXICODONE) immediate release tablet 10 mg, 10 mg, Oral, Q4H PRN, Jonetta Osgood, MD, 10 mg at 01/18/15 1442 .  pantoprazole (PROTONIX) EC tablet 40 mg, 40 mg, Oral, QAC breakfast, Etta Quill, DO, 40 mg at 01/18/15 3143 .  polyethylene glycol (MIRALAX / GLYCOLAX) packet 17 g, 17 g, Oral, BID, Jonetta Osgood, MD, 17 g at 01/17/15 2200 .  pregabalin (LYRICA) capsule 100 mg, 100 mg, Oral, TID, Etta Quill, DO, 100 mg at 01/18/15 1053 .  rOPINIRole (REQUIP) tablet 2 mg, 2 mg, Oral, QHS, Jared M Gardner, DO, 2 mg at 01/17/15 2310 .  sodium chloride 0.9 % injection 3 mL, 3 mL, Intravenous, Q12H, Etta Quill, DO, 3 mL at 01/16/15 2219 .  sodium phosphate (FLEET) 7-19 GM/118ML enema 1 enema, 1 enema, Rectal, Daily PRN, Jonetta Osgood, MD .  tamsulosin  [facility]) capsule 0.4 mg, 0.4 mg, Oral, Daily, Jared M Gardner, DO, 0.4 mg at 01/18/15 1053  Patients Current Diet: Diet Carb Modified Fluid consistency:: Thin; Room service appropriate?: Yes Diet - low sodium heart healthy Diet Carb Modified  Precautions / Restrictions Precautions Precautions: Fall Restrictions Weight Bearing Restrictions: No RLE Weight Bearing: Non weight bearing   Prior Activity Level Limited Community (1-2x/wk): Went out 1-3 X a week   Development worker, international aid / Paramedic Devices/Equipment: None Home Equipment: Crutches, Environmental consultant - 2 wheels, Wheelchair - manual, Shower seat  Prior Functional Level Prior Function Level of Independence: Independent Comments: disabled  Current Functional Level Cognition  Overall Cognitive Status: Within Functional Limits for tasks assessed Orientation Level: Oriented X4    Extremity Assessment (includes Sensation/Coordination)  Upper Extremity Assessment: Overall WFL for tasks assessed (can benefit from general strengthening of BUEs)  Lower Extremity Assessment: Defer to PT evaluation RLE: Unable to fully assess due to pain    ADLs  Overall ADL's : Needs assistance/impaired Eating/Feeding: Set up, Sitting Grooming: Min guard, Sitting Upper Body Bathing: Min guard, Sitting Lower Body Bathing: Moderate assistance, Sit to/from stand, Cueing for safety Upper Body Dressing : Min guard,  Sitting Lower Body Dressing: Moderate assistance, Cueing for safety, Sit to/from stand Toilet Transfer: Moderate assistance, RW, BSC General ADL Comments: Patient anxious during this OT eval/treat. Patient with decreased overall awareness or with depression?? Patient able to engage in bed mobility with steady assist and needed occasional min guard assist for dynamic sitting balance EOB. Patient required moderate encouragement to participate in therapy, but overall willing. Patient performed stand pivot transfer > recliner using RW  with light mod assist (assistance needed for lift/lower). Cues for safety during session. Education provided regarding recommended CIR for interdisciplanry rehab prior to d/c>home.     Mobility  Overal bed mobility: Needs Assistance Bed Mobility: Supine to Sit Supine to sit: Min guard General bed mobility comments: up in chair    Transfers  Overall transfer level: Needs assistance Equipment used: Rolling walker (2 wheeled) Transfers: Sit to/from Stand, Stand Pivot Transfers Sit to Stand: Min assist Stand pivot transfers: Mod assist General transfer comment: cues for hand placement, assist for safety, balance    Ambulation / Gait / Stairs / Wheelchair Mobility  Ambulation/Gait Ambulation/Gait assistance: Min assist Ambulation Distance (Feet): 33 Feet Assistive device: Rolling walker (2 wheeled) General Gait Details: in room only to door and back; standing momentarily for moving obstacles out of way Gait Pattern/deviations: Step-to pattern    Posture / Balance Dynamic Sitting Balance Sitting balance - Comments: can sit EOB without physical assist. can withstand min challenges to balance. Balance Overall balance assessment: Needs assistance Sitting-balance support: No upper extremity supported, Feet unsupported Sitting balance-Leahy Scale: Good Sitting balance - Comments: can sit EOB without physical assist. can withstand min challenges to balance. Postural control: Right lateral lean Standing balance support: Bilateral upper extremity supported Standing balance-Leahy Scale: Poor Standing balance comment: able to let go with one hand, but needs at least one upper extremity support due to BKA    Special needs/care consideration BiPAP/CPAP No CPM No Continuous Drip IV No Dialysis No         Life Vest No Oxygen No Special Bed No Trach Size No Wound Vac (area) No      Skin Has a new R BKA with dressing intact                              Bowel mgmt: Last BM 01/16/15 Bladder  mgmt: Voiding in urinal WDL Diabetic mgmt Yes, on insulin and oral medications at home    Previous Home Environment Living Arrangements: Spouse/significant other Available Help at Discharge: Family, Available 24 hours/day (wife and stepson) Type of Home: Mobile home Home Layout: One level Home Access: Ramped entrance Bathroom Shower/Tub: Walk-in shower, Door [employer] Toilet: Handicapped height Home Care Services: No  Discharge Living Setting Plans for Discharge Living Setting: Patient's home, Mobile Home Type of Home at Discharge: Mobile home Discharge Home Layout: One level Discharge Home Access: Ramped entrance Does the patient have any problems obtaining your medications?: No  Social/Family/Support Systems Patient Roles: Spouse, Parent (Has a wife, daughter, son and a brother.) Contact Information: Nihar Klus - spouse (h) [phone] (c) [phone] Anticipated Caregiver: Son and brother Anticipated Caregiver's Contact Information: Kemonte Ullman - brother [phone] Ability/Limitations of Caregiver: Son is handicapped and autistic, but can assist.  Brother is also available.  Wife works days. Caregiver Availability: 24/7 Discharge Plan Discussed with Primary Caregiver: Yes Is Caregiver In Agreement with Plan?: Yes Does Caregiver/Family have Issues with Lodging/Transportation while Pt is in Rehab?: No  Goals/Additional  Needs Patient/Family Goal for Rehab: PT/OT mod I goals Expected length of stay: 7 days Cultural Considerations: [employer] Dietary Needs: Carb mod med cal, thin liquids diet Equipment Needs: TBD Pt/Family Agrees to Admission and willing to participate: Yes Program Orientation Provided & Reviewed with Pt/Caregiver Including Roles  & Responsibilities: Yes  Decrease burden of Care through IP rehab admission: N/A  Possible need for SNF placement upon discharge: Not planned  Patient Condition: This patient's medical and functional status has  changed since the consult dated: 01/17/15 in which the Rehabilitation Physician determined and documented that the patient's condition is appropriate for intensive rehabilitative care in an inpatient rehabilitation facility. See "History of Present Illness" (above) for medical update. Functional changes are: Currently requiring min assist for transfers and ambulated min assist 33 ft RW. Patient's medical and functional status update has been discussed with the Rehabilitation physician and patient remains appropriate for inpatient rehabilitation. Will admit to inpatient rehab today.  Preadmission Screen Completed By:  Retta Diones, 01/18/2015 5:01 PM ______________________________________________________________________   Discussed status with Dr. Naaman Plummer on 01/18/15 at 35 and received telephone approval for admission today.  Admission Coordinator:  Retta Diones, time1701/Date05/10/16

## 2015-01-19 ENCOUNTER — Inpatient Hospital Stay (HOSPITAL_COMMUNITY): Payer: BLUE CROSS/BLUE SHIELD | Admitting: Occupational Therapy

## 2015-01-19 ENCOUNTER — Inpatient Hospital Stay (HOSPITAL_COMMUNITY): Payer: BLUE CROSS/BLUE SHIELD

## 2015-01-19 LAB — GLUCOSE, CAPILLARY
Glucose-Capillary: 111 mg/dL — ABNORMAL HIGH (ref 70–99)
Glucose-Capillary: 131 mg/dL — ABNORMAL HIGH (ref 70–99)
Glucose-Capillary: 156 mg/dL — ABNORMAL HIGH (ref 70–99)
Glucose-Capillary: 161 mg/dL — ABNORMAL HIGH (ref 70–99)
Glucose-Capillary: 215 mg/dL — ABNORMAL HIGH (ref 70–99)
Glucose-Capillary: 215 mg/dL — ABNORMAL HIGH (ref 70–99)
Glucose-Capillary: 84 mg/dL (ref 70–99)

## 2015-01-19 NOTE — Plan of Care (Signed)
Problem: Phase II Progression Outcomes Goal: Pain controlled Outcome: Completed/Met Date Met:  01/19/15 Anticipated pain; controlled by pain schedule

## 2015-01-19 NOTE — Progress Notes (Signed)
Physical Therapy Treatment Patient Details Name: [name] MRN: [id number] DOB: 1955/09/13 Today's Date: 01/19/2015    History of Present Illness [name] is a 59 y.o.-year-old male who has failed foot salvage intervention and has persistent osteomyelitis ulceration and cellulitis of the right hindfoot.  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    PT Comments    Patient very participatory, ambulates  With fair balance considering recent surgery, able to maintain  Trunk and residual limb near midline. Patient will benefit from rehab after DC to return to functional independence and prepare residual limb for prosthesis.  Follow Up Recommendations  SNF;Supervision/Assistance - 24 hour     Equipment Recommendations  None recommended by PT    Recommendations for Other Services       Precautions / Restrictions Precautions Precautions: Fall    Mobility  Bed Mobility         Supine to sit: Modified independent (Device/Increase time)        Transfers Overall transfer level: Needs assistance Equipment used: Rolling walker (2 wheeled) Transfers: Sit to/from Stand Sit to Stand: Min assist Stand pivot transfers: Min assist       General transfer comment: cues for hand placement, assist for safety, balance, rises from bed and  chair x4 trials together, uses hands appropriately   Ambulation/Gait Ambulation/Gait assistance: Min assist Ambulation Distance (Feet): 25 Feet (x 2 then 50' ) Assistive device: Rolling walker (2 wheeled) Gait Pattern/deviations: Step-to pattern     General Gait Details: uses RW as  standard by picking it up vs. rolling. stands well with balance and midline. gait is smoothe .   Stairs            Wheelchair Mobility    Modified Rankin (Stroke Patients Only)       Balance                                    Cognition Arousal/Alertness: Awake/alert                          Exercises Total Joint Exercises Knee Flexion:  AROM;Right;10 reps;Standing General Exercises - Lower Extremity Hip ABduction/ADduction: AROM;Right;10 reps;Standing Hip Flexion/Marching: AROM;Right;10 reps;Standing    General Comments        Pertinent Vitals/Pain Faces Pain Scale: Hurts even more Pain Location: neck, back, R residual limb Pain Descriptors / Indicators: Aching;Sore Pain Intervention(s): Limited activity within patient's tolerance;Premedicated before session;Repositioned    Home Living                      Prior Function            PT Goals (current goals can now be found in the care plan section) Progress towards PT goals: Progressing toward goals    Frequency  Min 3X/week    PT Plan Discharge plan needs to be updated    Co-evaluation             End of Session Equipment Utilized During Treatment: Gait belt Activity Tolerance: Patient tolerated treatment well Patient left: in chair;with call bell/phone within reach     Time: 0209-0239 PT Time Calculation (min) (ACUTE ONLY): 30 min  Charges:  $Gait Training: 8-22 mins $Therapeutic Exercise: 8-22 mins                    G Codes:  [name] 01/19/2015, 2:49 PM [name] PT [phone]

## 2015-01-19 NOTE — Progress Notes (Signed)
Rehab admissions - I spoke with rehab MD today.  We will not do a peer to peer appeal per rehab MD.  Dr. Naaman Plummer feels that we do not have sufficient justification to do an appeal.  Recommend pursuit of SNF placement at this time.  Call me for questions.  [phone]

## 2015-01-19 NOTE — Progress Notes (Signed)
Subjective:  No specific complaints, patient has been nauseous this morning.  Otherwise tolerating all his medications.  No fever or chills.  Objective:  Vital Signs in the last 24 hours: Temp:  [97.8 F (36.6 C)-99 F (37.2 C)] 99 F (37.2 C) (05/11 0518) Pulse Rate:  [56-67] 56 (05/11 0518) Resp:  [16-20] 20 (05/11 0518) BP: (136-183)/(60-72) 136/64 mmHg (05/11 0518) SpO2:  [97 %-100 %] 97 % (05/11 0518)  Intake/Output from previous day: 05/10 0701 - 05/11 0700 In: 440 [P.O.:440] Out: 2500 [Urine:2500]  Physical Exam: General appearance: alert, cooperative, appears stated age, no distress and mildly obese Lungs: Clear to auscultate.Marland Kitchen Heart: regular rate and rhythm, S1, S2 normal, no murmur, click, rub or gallop Abdomen: soft, non-tender; bowel sounds normal; no masses, no organomegaly Extremities: Patient has left 2 toes amputated in the remote past and has healed well. Right BKA in bandages. The lower extremities otherwise have full range of movements. There is no tenderness, there is 2 plus pitting leg edema on the left. Neurologic: Grossly normal Lab Results: BMP  Recent Labs  01/15/15 0301 01/16/15 0530 01/17/15 0443  NA 134* 135 136  K 3.4* 3.9 3.8  CL 100* 101 103  CO2 26 26 26   GLUCOSE 187* 139* 108*  BUN 18 17 12   CREATININE 1.44* 1.61* 1.24  CALCIUM 8.0* 8.2* 8.4*  GFRNONAA 51* 45* >60  GFRAA 60* 52* >60    CBC  Recent Labs Lab 01/14/15 0210  01/17/15 0443  WBC 14.3*  < > 5.6  RBC 3.61*  < > 2.75*  HGB 10.5*  < > 8.0*  HCT 30.3*  < > 23.0*  PLT 191  < > 192  MCV 83.9  < > 83.6  MCH 29.1  < > 29.1  MCHC 34.7  < > 34.8  RDW 13.5  < > 13.3  LYMPHSABS 0.7  --   --   MONOABS 1.3*  --   --   EOSABS 0.0  --   --   BASOSABS 0.0  --   --   < > = values in this interval not displayed.  HEMOGLOBIN A1C Lab Results  Component Value Date   HGBA1C 6.5* 09/26/2014   MPG 140* 09/26/2014    Cardiac Panel (last 3 results)  Hepatic Function  Panel  Recent Labs  08/28/14 0516 09/26/14 1405 09/26/14 1920 01/14/15 0210  PROT 5.9* 6.4  --  6.7  ALBUMIN 2.8* 3.7 3.6 3.7  AST 16 35  --  21  ALT 14 32  --  17  ALKPHOS 107 119*  --  114  BILITOT 0.2* 0.7  --  0.9    EKG: Normal sinus rhythm, left atrial enlargement. Leftward axis, right bundle branch block. LVH.   Echo: 09/27/2014: Moderate LVH, normal LV systolic function. Grade 2 diastolic dysfunction with elevated LA pressure.   Assessment/Plan:  1. Nonhealing diabetic foot ulcer, status post right BKA 2. Coronary artery disease of the native vessel, history of PTCA and stenting of the entire RCA with implantation of 3 overlapping DES performed on 10/29/2014. 3. Chronic kidney disease, acute renal failure has resolved, patient presently in stage II chronic kidney disease. 4. Hypertension 5. Hyperlipidemia 6. Diabetes mellitus type 2 controlled  Recommendation: patient's blood pressure is stable, tolerating higher dose of Coreg, patient is presently back on all his home medications.  Please call me if he needs any further assistance. I have comforted the patient.   Adrian Prows, M.D. 01/19/2015, 9:31 AM [facility] Pager: [phone] Office: [phone] If no answer: [phone]

## 2015-01-19 NOTE — Progress Notes (Addendum)
Referred to talk with patient about [facility] resources and Electronic WC. Informed patient that he would be able to have PT OT RN Aide come to his home after discharge. Each would visit with him one to three times per week for about an hour. Informed pt that an electronic wheelchair would need to be prescribed by his family physician after discharge, and that it takes a little while for approval and to obtain it. Pt understood that it could not be something he could leave the hospital with. Pt also stated that he has a normal wheelchair and that he felt that would not be enough support for what his needs will be at home. Pt stated that he has a mentally handicapped son that would be his 70 hour supervision, and that he would not be able to help his father with his medications. The pt stated that he felt like a SNF would be the best plan for him at discharge. Spoke with pt's wife [phone], and updated her on available home resources and wheelchair as well. She stated that she is in the process of planning SNF placement with the SW, and will proceed with SNF at discharge as of now. CM asked PT to work with patient again to evaluate level of care needed, so we have a final recommendation prior to discharge.

## 2015-01-19 NOTE — Progress Notes (Signed)
PATIENT DETAILS Name: CLAUDELL NOLDEN Age: 59 y.o. Sex: male Date of Birth: 29-Jan-1956 Admit Date: 01/14/2015 Admitting Physician Etta Quill, DO [id number] DENNIS, MD  Subjective: Sitting in chair talking to family members, denies pain. Feeling better.  Assessment/Plan: Sepsis: Secondary to right foot ulcer with osteomyelitis, not sure if ?PNA (asymptomatic-but seen on Xray).Started on empiric vancomycin and Zosyn on admission. Underwent transtibial amputation on 5/6 by [name]. Sepsis pathophysiology has resolved Blood cultures continued to be negative. Patient was continued on vancomycin and Zosyn till 5/9 when they were discontinued. Does not have any further fever, significantly improved. Continues to be Stable for discharge to CIR if bed available today.  Active Problems:  Leukocytosis: secondary to above. Resolved with antibiotics and amputation.   ? [id number] seen on CXR-but patient without symptoms. Was on vancomycin and Zosyn since admission,  transitioned to levofloxacin for 2 more days from 5/9-now off levofloxacin  CAD-s/p recent PCI of RCA in Feb 2016:Continue Aspirin, Plavix, statin and Coreg. This M.D. Spoke with Dr Marcellina Millin to proceed with surgery. No major complications post Surgery.   HTN (hypertension): Controlled, continue Coreg (dose adjusted-as BP soft), Amlodipine and Imdur. Avapro held as renal function briefly worsened-resume when able. Renal function is back to normal on discharge.   Mild acute kidney injury: Euvolemic on exam-?from urinary retention.did briefly require one time in/out catheterization with drainage of 700 mL of urine. Renal function is back to normal. Continue Flomax.    Anemia: suspect from acute illness/mild perioperative blood loss. Hemoglobin stable, check periodically.   Type 2 diabetes mellitus: CBG's stable, continue Lantus and SSI.   BPH: Continue Flomax   Diabetic peripheral  neuropathy: Continue Lyrica   Dyslipidemia: continue statin   PAD: (H/O Left popliteal artery atherectomy in June 2012. Right below knee angioplasty in 2013 and left below knee PTA 2014 by Dr. Brunetta Jeans at [facility]).Continue Plavix, aspirin and statin for secondary prevention.   Hx of Carotid artery occulusion s/p left carotid endarterectomy   Chronic pain syndrome: Will continue with transdermal fentanyl and oral Dilaudid on discharge.  Disposition: SNF on discharge  Antimicrobial agents  See below  Anti-infectives    Start     Dose/Rate Route Frequency Ordered Stop   01/17/15 1100  levofloxacin (LEVAQUIN) tablet 750 mg     750 mg Oral Daily 01/17/15 1016 01/18/15 1052   01/17/15 0000  levofloxacin (LEVAQUIN) 750 MG tablet     750 mg Oral Daily 01/17/15 1016     01/14/15 1600  vancomycin (VANCOCIN) IVPB 750 mg/150 ml premix  Status:  Discontinued     750 mg 150 mL/hr over 60 Minutes Intravenous Every 12 hours 01/14/15 0452 01/17/15 1000   01/14/15 1049  ceFAZolin (ANCEF) 2-3 GM-% IVPB SOLR    Comments:  Sammuel Cooper   : cabinet override      01/14/15 1049 01/14/15 2259   01/14/15 1000  piperacillin-tazobactam (ZOSYN) IVPB 3.375 g  Status:  Discontinued     3.375 g 12.5 mL/hr over 240 Minutes Intravenous Every 8 hours 01/14/15 0452 01/17/15 1000   01/14/15 0500  vancomycin (VANCOCIN) IVPB 1000 mg/200 mL premix     1,000 mg 200 mL/hr over 60 Minutes Intravenous  Once 01/14/15 0452 01/14/15 0739   01/14/15 0300  vancomycin (VANCOCIN) IVPB 1000 mg/200 mL premix     1,000 mg 200 mL/hr over 60 Minutes Intravenous  Once 01/14/15 0246 01/14/15  [id number]   01/14/15 0300  piperacillin-tazobactam (ZOSYN) IVPB 3.375 g     3.375 g 100 mL/hr over 30 Minutes Intravenous  Once 01/14/15 0246 01/14/15 0354      DVT Prophylaxis: Prophylactic Lovenox   Code Status: Full code  Family Communication Brother at bedside  Procedures: Right Transtibial amputation 5/6  CONSULTS:   orthopedic surgery  Time spent 25 minutes-which includes 50% of the time with face-to-face with patient/ family and coordinating care related to the above assessment and plan.  MEDICATIONS: Scheduled Meds: . ALPRAZolam  0.25 mg Oral q morning - 10a  . amLODipine  5 mg Oral QHS  . aspirin EC  81 mg Oral Daily  . atorvastatin  40 mg Oral Daily  . carvedilol  37.5 mg Oral q morning - 10a  . carvedilol  6.25 mg Oral QHS  . clopidogrel  75 mg Oral Daily  . enoxaparin (LOVENOX) injection  40 mg Subcutaneous Q24H  . fentaNYL  75 mcg Transdermal Q72H  . hydrALAZINE  25 mg Oral TID  . insulin aspart  0-9 Units Subcutaneous 6 times per day  . insulin glargine  50 Units Subcutaneous QHS  . isosorbide mononitrate  120 mg Oral Daily  . Linaclotide  145 mcg Oral Daily  . pantoprazole  40 mg Oral QAC breakfast  . polyethylene glycol  17 g Oral BID  . pregabalin  100 mg Oral TID  . rOPINIRole  2 mg Oral QHS  . sodium chloride  3 mL Intravenous Q12H  . tamsulosin  0.4 mg Oral Daily   Continuous Infusions: . sodium chloride 10 mL/hr at 01/19/15 1153   PRN Meds:.acetaminophen **OR** acetaminophen, bisacodyl, HYDROmorphone (DILAUDID) injection, methocarbamol **OR** methocarbamol (ROBAXIN)  IV, metoCLOPramide **OR** metoCLOPramide (REGLAN) injection, ondansetron **OR** ondansetron (ZOFRAN) IV, ondansetron, oxyCODONE, sodium phosphate    PHYSICAL EXAM: Vital signs in last 24 hours: Filed Vitals:   01/18/15 2100 01/19/15 0518 01/19/15 1018 01/19/15 1614  BP: 143/60 136/64 172/73 132/56  Pulse: 65 56    Temp: 97.8 F (36.6 C) 99 F (37.2 C)  98.7 F (37.1 C)  TempSrc: Oral Oral  Oral  Resp: 20 20  18   Height:      Weight:      SpO2: 99% 97%  98%    Weight change:  Filed Weights   01/14/15 0441 01/14/15 0548 01/16/15 0546  Weight: 115.9 kg (255 lb 8.2 oz) 115.7 kg (255 lb 1.2 oz) 116.892 kg (257 lb 11.2 oz)   Body mass index is 33.07 kg/(m^2).   Gen Exam: Awake and alert with clear  speech. Not in any distress Neck: Supple, No JVD.   Chest: B/L Clear. No rales or rhonchi CVS: S1 S2 Regular, no murmurs.  Abdomen: soft, BS +, non tender, non distended.  Extremities: no edema, lower extremities warm to touch. Status post right transtibial amputation-dressing clean/dry Neurologic: Non Focal.   Skin: No Rash.   Wounds: N/A.    Intake/Output from previous day:  Intake/Output Summary (Last 24 hours) at 01/19/15 2031 Last data filed at 01/19/15 1921  Gross per 24 hour  Intake 729.33 ml  Output   3000 ml  Net -2270.67 ml     LAB RESULTS: CBC  Recent Labs Lab 01/14/15 0210 01/15/15 0301 01/16/15 0530 01/17/15 0443  WBC 14.3* 8.8 6.3 5.6  HGB 10.5* 8.7* 8.1* 8.0*  HCT 30.3* 25.4* 23.6* 23.0*  PLT 191 173 165 192  MCV 83.9 84.1 84.9 83.6  MCH 29.1 28.8 29.1 29.1  MCHC 34.7 34.3 34.3 34.8  RDW 13.5 13.8 13.7 13.3  LYMPHSABS 0.7  --   --   --   MONOABS 1.3*  --   --   --   EOSABS 0.0  --   --   --   BASOSABS 0.0  --   --   --     Chemistries   Recent Labs Lab 01/14/15 0210 01/15/15 0301 01/16/15 0530 01/17/15 0443  NA 134* 134* 135 136  K 3.8 3.4* 3.9 3.8  CL 99* 100* 101 103  CO2 24 26 26 26   GLUCOSE 233* 187* 139* 108*  BUN 16 18 17 12   CREATININE 1.38* 1.44* 1.61* 1.24  CALCIUM 8.9 8.0* 8.2* 8.4*    CBG:  Recent Labs Lab 01/19/15 0406 01/19/15 0759 01/19/15 1225 01/19/15 1613 01/19/15 2007  GLUCAP 111* 84 156* 215* 131*    GFR Estimated Creatinine Clearance: 86.1 mL/min (by C-G formula based on Cr of 1.24).  Coagulation profile No results for input(s): INR, PROTIME in the last 168 hours.  Cardiac Enzymes No results for input(s): CKMB, TROPONINI, MYOGLOBIN in the last 168 hours.  Invalid input(s): CK  Invalid input(s): POCBNP No results for input(s): DDIMER in the last 72 hours. No results for input(s): HGBA1C in the last 72 hours. No results for input(s): CHOL, HDL, LDLCALC, TRIG, CHOLHDL, LDLDIRECT in the last 72  hours. No results for input(s): TSH, T4TOTAL, T3FREE, THYROIDAB in the last 72 hours.  Invalid input(s): FREET3 No results for input(s): VITAMINB12, FOLATE, FERRITIN, TIBC, IRON, RETICCTPCT in the last 72 hours. No results for input(s): LIPASE, AMYLASE in the last 72 hours.  Urine Studies No results for input(s): UHGB, CRYS in the last 72 hours.  Invalid input(s): UACOL, UAPR, USPG, UPH, UTP, UGL, UKET, UBIL, UNIT, UROB, ULEU, UEPI, UWBC, URBC, UBAC, CAST, UCOM, BILUA  MICROBIOLOGY: Recent Results (from the past 240 hour(s))  Blood culture (routine x 2)     Status: None (Preliminary result)   Collection Time: 01/14/15  2:10 AM  Result Value Ref Range Status   Specimen Description BLOOD RIGHT ARM  Final   Special Requests BOTTLES DRAWN AEROBIC AND ANAEROBIC 10CC  Final   Culture   Final           BLOOD CULTURE RECEIVED NO GROWTH TO DATE CULTURE WILL BE HELD FOR 5 DAYS BEFORE ISSUING A FINAL NEGATIVE REPORT Note: Culture results may be compromised due to an excessive volume of blood received in culture bottles. Performed at [facility]    Report Status PENDING  Incomplete  Blood culture (routine x 2)     Status: None (Preliminary result)   Collection Time: 01/14/15  2:22 AM  Result Value Ref Range Status   Specimen Description BLOOD LEFT ARM  Final   Special Requests BOTTLES DRAWN AEROBIC ONLY 10CC  Final   Culture   Final           BLOOD CULTURE RECEIVED NO GROWTH TO DATE CULTURE WILL BE HELD FOR 5 DAYS BEFORE ISSUING A FINAL NEGATIVE REPORT Note: Culture results may be compromised due to an excessive volume of blood received in culture bottles. Performed at [facility]    Report Status PENDING  Incomplete  Urine culture     Status: None   Collection Time: 01/14/15  3:46 AM  Result Value Ref Range Status   Specimen Description URINE, RANDOM  Final   Special Requests NONE  Final   Colony Count NO GROWTH Performed  at [facility]   Final   Culture NO  GROWTH Performed at [facility]   Final   Report Status 01/15/2015 FINAL  Final  MRSA PCR Screening     Status: None   Collection Time: 01/14/15  5:54 AM  Result Value Ref Range Status   MRSA by PCR DUPLICATE TEST ORDERED/CREDITED PENDING [id number] NEGATIVE Final    Comment:        The GeneXpert MRSA Assay (FDA approved for NASAL specimens only), is one component of a comprehensive MRSA colonization surveillance program. It is not intended to diagnose MRSA infection nor to guide or monitor treatment for MRSA infections.   Surgical pcr screen     Status: None   Collection Time: 01/14/15  8:49 AM  Result Value Ref Range Status   MRSA, PCR NEGATIVE NEGATIVE Final   Staphylococcus aureus NEGATIVE NEGATIVE Final    Comment:        The Xpert SA Assay (FDA approved for NASAL specimens in patients over 61 years of age), is one component of a comprehensive surveillance program.  Test performance has been validated by [facility] for patients greater than or equal to 55 year old. It is not intended to diagnose infection nor to guide or monitor treatment.     RADIOLOGY STUDIES/RESULTS: Dg Chest 2 View  01/14/2015   CLINICAL DATA:  Chills, vomiting and fever.  EXAM: CHEST  2 VIEW  COMPARISON:  Chest radiograph September 26, 2014  FINDINGS: Cardiac silhouette is mildly enlarged, unchanged. Tortuous mildly calcified aorta. Mild chronic bronchitic change without pleural effusion. Patchy RIGHT middle lobe airspace opacity. No pneumothorax. Cervical spine instrumentation partially imaged. Moderate degenerative change of the thoracic spine.  IMPRESSION: Mild cardiomegaly. Similar bronchitic changes with superimposed patchy RIGHT middle lobe airspace opacity which may reflect atelectasis or possibly pneumonia.   Electronically Signed   By: Elon Alas   On: 01/14/2015 04:07    Evelin Cake, MD PhD [facility] Pager:[phone]  If 7PM-7AM, please contact  night-coverage [email or web address] Password [email or web address] 01/19/2015, 8:31 PM   LOS: 5 days

## 2015-01-19 NOTE — Progress Notes (Addendum)
Occupational Therapy Treatment Patient Details Name: [name] MRN: [id number] DOB: February 11, 1956 Today's Date: 01/19/2015    History of present illness [name] is a 59 y.o.-year-old male who has failed foot salvage intervention and has persistent osteomyelitis ulceration and cellulitis of the right hindfoot.  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   OT comments   Pt progressing and ambulated to sink and performed ADLs. Plan is now SNF.  Follow Up Recommendations  SNF    Equipment Recommendations  Other (comment) (defer to next venue)    Recommendations for Other Services      Precautions / Restrictions Precautions Precautions: Fall Restrictions Weight Bearing Restrictions: Yes RLE Weight Bearing: Non weight bearing       Mobility  Transfers Overall transfer level: Needs assistance Equipment used: Rolling walker (2 wheeled) Transfers: Sit to/from Stand Sit to Stand: Min guard            Balance    Unsteady at sink-Min guard. Min guard for ambulation with RW.                               ADL Overall ADL's : Needs assistance/impaired     Grooming: Applying deodorant;Wash/dry hands;Min guard;Standing       Lower Body Bathing: Minimal assistance (washed bottom while standing at sink) Lower Body Bathing Details (indicate cue type and reason): Min assist to clean more thoroughly; OT applied cream to buttocks         Toilet Transfer: Min guard;Ambulation;RW;BSC           Functional mobility during ADLs: Min guard;Rolling walker General ADL Comments: Explained what pt could use for toilet aide as he reports difficulty reaching behind. Discussed precautions for right lower extremity.  Educated/demonstrated on desensitization techniques for right lower extremity.      Vision                     Perception     Praxis      Cognition  Awake/Alert Behavior During Therapy: WFL for tasks assessed/performed Overall Cognitive Status: Within  Functional Limits for tasks assessed                       Extremity/Trunk Assessment                  Shoulder Instructions       General Comments      Pertinent Vitals/ Pain       Pain Assessment: 0-10 Pain Score: 9  Pain Location: right lower extremity; buttocks Pain Descriptors / Indicators: Sore;Throbbing;Other (Comment) (sore-bottom and throbbing/stinging in RLE) Pain Intervention(s): Monitored during session;Other (comment);Premedicated before session (applied cream to bottom)  Home Living                                          Prior Functioning/Environment              Frequency Min 2X/week     Progress Toward Goals  OT Goals(current goals can now be found in the care plan section)  Progress towards OT goals: Progressing toward goals (updated a couple goals due to progress)  Acute Rehab OT Goals Patient Stated Goal: not stated OT Goal Formulation: With patient/family Time For Goal Achievement: 01/24/15 Potential to Achieve Goals: Good ADL Goals Pt Will Perform  Grooming: with set-up;with supervision;standing Pt Will Perform Upper Body Bathing: Independently;sitting Pt Will Perform Lower Body Bathing: with min assist;with adaptive equipment;sit to/from stand Pt Will Perform Upper Body Dressing: Independently;sitting Pt Will Perform Lower Body Dressing: with min assist;with adaptive equipment;sit to/from stand Pt Will Transfer to Toilet: bedside commode;with supervision Pt/caregiver will Perform Home Exercise Program: Increased strength;Both right and left upper extremity;With written HEP provided;With Supervision  Plan Discharge plan needs to be updated    Co-evaluation                 End of Session Equipment Utilized During Treatment: Gait belt;Rolling walker   Activity Tolerance Patient tolerated treatment well   Patient Left in chair;with call bell/phone within reach   Nurse Communication           Time: [id number] OT Time Calculation (min): 18 min  Charges: OT General Charges $OT Visit: 1 Procedure OT Treatments $Self Care/Home Management : 8-22 mins  Benito Mccreedy OTR/L [id number] 01/19/2015, 4:38 PM

## 2015-01-20 LAB — BASIC METABOLIC PANEL
Anion gap: 7 (ref 5–15)
BUN: 15 mg/dL (ref 6–20)
CO2: 27 mmol/L (ref 22–32)
Calcium: 8.5 mg/dL — ABNORMAL LOW (ref 8.9–10.3)
Chloride: 104 mmol/L (ref 101–111)
Creatinine, Ser: 1.18 mg/dL (ref 0.61–1.24)
GFR calc Af Amer: >60 mL/min (ref >60)
GFR calc non Af Amer: >60 mL/min (ref >60)
Glucose, Bld: 123 mg/dL — ABNORMAL HIGH (ref 65–99)
Potassium: 4.4 mmol/L (ref 3.5–5.1)
Sodium: 138 mmol/L (ref 135–145)

## 2015-01-20 LAB — CULTURE, BLOOD (ROUTINE X 2)
Culture: NO GROWTH
Culture: NO GROWTH

## 2015-01-20 LAB — GLUCOSE, CAPILLARY
Glucose-Capillary: 129 mg/dL — ABNORMAL HIGH (ref 65–99)
Glucose-Capillary: 105 mg/dL — ABNORMAL HIGH (ref 65–99)
Glucose-Capillary: 143 mg/dL — ABNORMAL HIGH (ref 65–99)
Glucose-Capillary: 90 mg/dL (ref 65–99)

## 2015-01-20 LAB — MAGNESIUM: Magnesium: 1.7 mg/dL (ref 1.7–2.4)

## 2015-01-20 LAB — CBC
HCT: 22.7 % — ABNORMAL LOW (ref 39.0–52.0)
Hemoglobin: 7.8 g/dL — ABNORMAL LOW (ref 13.0–17.0)
MCH: 28.6 pg (ref 26.0–34.0)
MCHC: 34.4 g/dL (ref 30.0–36.0)
MCV: 83.2 fL (ref 78.0–100.0)
Platelets: 235 10*3/uL (ref 150–400)
RBC: 2.73 MIL/uL — ABNORMAL LOW (ref 4.22–5.81)
RDW: 13.2 % (ref 11.5–15.5)
WBC: 5.6 10*3/uL (ref 4.0–10.5)

## 2015-01-20 MED ORDER — LINACLOTIDE 145 MCG PO CAPS: 145.0000 ug | ORAL_CAPSULE | Freq: Every day | ORAL | Status: DC | PRN

## 2015-01-20 MED ORDER — CARVEDILOL 12.5 MG PO TABS: 37.5000 mg | ORAL_TABLET | Freq: Every morning | ORAL | Status: DC

## 2015-01-20 MED ORDER — LISINOPRIL 2.5 MG PO TABS: 5.0000 mg | ORAL_TABLET | Freq: Every day | ORAL | Status: DC

## 2015-01-20 MED ORDER — FENTANYL 50 MCG/HR TD PT72: 50.0000 ug | MEDICATED_PATCH | TRANSDERMAL | Status: DC

## 2015-01-20 MED ORDER — FUROSEMIDE 80 MG PO TABS: 40.0000 mg | ORAL_TABLET | Freq: Every day | ORAL | Status: DC

## 2015-01-20 MED ORDER — CLOBETASOL PROPIONATE 0.05 % EX SOLN: 1.0000 "application " | Freq: Two times a day (BID) | CUTANEOUS | Status: DC | PRN

## 2015-01-20 NOTE — Progress Notes (Signed)
Pt prepared for d/c to SNF. IV d/c'd. Skin intact except as most recently charted. Vitals are stable. Report called to receiving facility. Pt to be transported by ambulance service. 

## 2015-01-20 NOTE — Clinical Social Work Placement (Signed)
   CLINICAL SOCIAL WORK PLACEMENT  NOTE  Date:  01/20/2015  Patient Details  Name: [name] MRN: [id number] Date of Birth: May 20, 1956  Clinical Social Work is seeking post-discharge placement for this patient at the [place] level of care (*CSW will initial, date and re-position this form in  chart as items are completed):  Yes   Patient/family provided with [place] Work Department's list of facilities offering this level of care within the geographic area requested by the patient (or if unable, by the patient's family).  Yes   Patient/family informed of their freedom to choose among providers that offer the needed level of care, that participate in Medicare, Medicaid or managed care program needed by the patient, have an available bed and are willing to accept the patient.  Yes   Patient/family informed of [place]'s ownership interest in [facility] and [facility], as well as of the fact that they are under no obligation to receive care at these facilities.  PASRR submitted to EDS on 01/19/15     PASRR number received on 01/19/15     Existing PASRR number confirmed on       FL2 transmitted to all facilities in geographic area requested by pt/family on 01/19/15     FL2 transmitted to all facilities within larger geographic area on       Patient informed that his/her managed care company has contracts with or will negotiate with certain facilities, including the following:        Yes   Patient/family informed of bed offers received.  Patient chooses bed at [facility]     Physician recommends and patient chooses bed at      Patient to be transferred to [facility] on 01/20/15.  Patient to be transferred to facility by Ambulance Corey Harold)     Patient family notified on 01/19/15 of transfer.  Name of family member notified:  Ethaniel Stuver (spouse) over the phone     PHYSICIAN Please prepare priority discharge summary,  including medications     Additional Comment:    _______________________________________________ Barbette Or, [place]

## 2015-01-20 NOTE — Clinical Social Work Note (Signed)
Clinical Social Work Assessment  Patient Details  Name: [name] MRN: [id number] Date of Birth: 19-Aug-1956  Date of referral:  01/19/15               Reason for consult:  Facility Placement, Discharge Planning                Permission sought to share information with:  Family Supports Permission granted to share information::  Yes, Verbal Permission Granted  Name::     Garrison Michie  Relationship::  Spouse  Contact Information:  [phone]  Housing/Transportation Living arrangements for the past 2 months:  [employer] of Information:  Patient, Spouse Patient Interpreter Needed:  None Criminal Activity/Legal Involvement Pertinent to Current Situation/Hospitalization:  No - Comment as needed Significant Relationships:  Adult Children, Siblings, Spouse Lives with:  Spouse, Adult Children (Patient adult son is handicap) Do you feel safe going back to the place where you live?  Yes Need for family participation in patient care:  Yes (Comment)  Care giving concerns:  Patient wife expressed intense concerns regarding patient insurance coverage and lack of support for inpatient rehab admission.  Patient wife also verbalized her lack of understanding for patient insurance to not provide authorization for SNF if inpatient rehab was denied - CSW explained process and frequent insurance barriers and patient wife verbalized understanding.   Social Worker assessment / plan:  [facility] representative met with patient at bedside and patient wife over the phone to offer support and discuss patient plans at discharge.  Patient and patient wife both agree that SNF placement would be the next best option at discharge due to the insurance denial for inpatient rehab.  Patient and patient wife both express interest in [facility].  CSW initiated referral and bed offer extended.  CSW then spent countless hours on the phone with [employer] working towards authorization for [employer] already had clinicals and pre cert information from inpatient rehab submission.  Authorization provided at 5:54pm for the dates of 05/12 - 05/20.  Authorization number [id number].  Facility updated by both CSW and [facility] representative.  CSW contacted patient wife again over the phone to provide update and notify of patient plans to discharge on 05/12.  CSW remains available for support and to facilitate patient discharge needs.  Employment status:  Retired Advertising copywriter, [employer] PT Recommendations:  Inpatient [place], [place] / Referral to community resources:  [place]  Patient/Family's Response to care:  Patient wife expressed sincere gratitude for support and involvement to receive insurance authorization for SNF placement.  Patient and patient wife both agreeable with authorized amount of days and hopeful for patient return home once power wheelchair available through patient insurance.  Patient/Family's Understanding of and Emotional Response to Diagnosis, Current Treatment, and Prognosis:  Patient and patient wife realistic regarding patient needs at discharge and the future.  Patient home already handicap accessible which has presented as a strength in patient current situation.  Patient verbalizes his desire for a prosthetic once the stump has healed.  Patient understanding and appreciative.  Emotional Assessment Appearance:  Appears stated age, Well-Groomed Attitude/Demeanor/Rapport:  Complaining Affect (typically observed):  Accepting, Appropriate, Calm Orientation:  Oriented to Self, Oriented to Place, Oriented to  Time, Oriented to Situation Alcohol / Substance use:  Not Applicable Psych involvement (Current and /or in the community):  No (Comment)  Discharge Needs  Concerns to be addressed:  Discharge Planning Concerns Readmission within the last 30 days:  No Current discharge risk:   Dependent with Mobility Barriers to Discharge:  Insurance Authorization (Due to inpatient rehab submission - SNF authorization submission delayed)  Barbette Or, [place]

## 2015-01-20 NOTE — Clinical Social Work Note (Signed)
Clinical Social Worker facilitated patient discharge including contacting patient family and facility to confirm patient discharge plans.  Clinical information faxed to facility and family agreeable with plan.  CSW arranged ambulance transport via [facility] to [facility].  RN to call report prior to discharge.  DC packet prepared and will be placed in chart for transport.   Clinical Social Worker will sign off for now as social work intervention is no longer needed. Please consult [place] again if new need arises.  Glendon Axe, MSW, LCSWA [phone] 01/20/2015 12:02 PM

## 2015-01-20 NOTE — Clinical Social Work Note (Signed)
Clinical Social Worker has sent [place] discharge summary to [facility] via [place]. CSW has also contacted [facility] admissions coordinator, Ivin Booty to notify facility of medication changes.  DC packet prepared for transport.   Clinical Social Worker will sign off for now as social work intervention is no longer needed. Please consult [place] again if new need arises.  Glendon Axe, MSW, LCSWA [phone] 01/20/2015 4:20 PM

## 2015-01-20 NOTE — Care Management Note (Signed)
Case Management Note  Patient Details  Name: [name] MRN: [id number] Date of Birth: Jun 11, 1956  Subjective/Objective:                 Admitted with sepsis, fro home with wife and mentally handicapped son   Action/Plan:  Will discharge to SNF Expected Discharge Date:                  Expected Discharge Plan:  Skilled Nursing Facility  In-House Referral:  Clinical Social Work  Discharge planning Services  CM Consult  Post Acute Care Choice:    Choice offered to:     DME Arranged:    DME Agency:     HH Arranged:    [place] Agency:     Status of Service:  Completed, signed off  Medicare Important Message Given:    Date Medicare IM Given:    Medicare IM give by:    Date Additional Medicare IM Given:    Additional Medicare Important Message give by:     If discussed at [place] of Stay Meetings, dates discussed:    Additional Comments:  Carles Collet, RN 01/20/2015, 12:00 PM

## 2015-01-21 ENCOUNTER — Non-Acute Institutional Stay: Payer: BLUE CROSS/BLUE SHIELD | Admitting: Adult Health

## 2015-01-21 ENCOUNTER — Encounter: Payer: Self-pay | Admitting: Adult Health

## 2015-01-21 DIAGNOSIS — E1140 Type 2 diabetes mellitus with diabetic neuropathy, unspecified: Secondary | ICD-10-CM

## 2015-01-21 DIAGNOSIS — N400 Enlarged prostate without lower urinary tract symptoms: Secondary | ICD-10-CM | POA: Diagnosis not present

## 2015-01-21 DIAGNOSIS — K5909 Other constipation: Secondary | ICD-10-CM

## 2015-01-21 DIAGNOSIS — G2581 Restless legs syndrome: Secondary | ICD-10-CM | POA: Diagnosis not present

## 2015-01-21 DIAGNOSIS — F419 Anxiety disorder, unspecified: Secondary | ICD-10-CM

## 2015-01-21 DIAGNOSIS — K5900 Constipation, unspecified: Secondary | ICD-10-CM | POA: Diagnosis not present

## 2015-01-21 DIAGNOSIS — E785 Hyperlipidemia, unspecified: Secondary | ICD-10-CM | POA: Diagnosis not present

## 2015-01-21 DIAGNOSIS — K219 Gastro-esophageal reflux disease without esophagitis: Secondary | ICD-10-CM | POA: Diagnosis not present

## 2015-01-21 DIAGNOSIS — M869 Osteomyelitis, unspecified: Secondary | ICD-10-CM

## 2015-01-21 DIAGNOSIS — R5381 Other malaise: Secondary | ICD-10-CM

## 2015-01-21 DIAGNOSIS — I25810 Atherosclerosis of coronary artery bypass graft(s) without angina pectoris: Secondary | ICD-10-CM

## 2015-01-21 DIAGNOSIS — D62 Acute posthemorrhagic anemia: Secondary | ICD-10-CM | POA: Diagnosis not present

## 2015-01-21 DIAGNOSIS — I10 Essential (primary) hypertension: Secondary | ICD-10-CM | POA: Diagnosis not present

## 2015-01-21 DIAGNOSIS — G629 Polyneuropathy, unspecified: Secondary | ICD-10-CM

## 2015-01-25 ENCOUNTER — Non-Acute Institutional Stay: Payer: BLUE CROSS/BLUE SHIELD | Admitting: Internal Medicine

## 2015-01-25 DIAGNOSIS — E785 Hyperlipidemia, unspecified: Secondary | ICD-10-CM

## 2015-01-25 DIAGNOSIS — G629 Polyneuropathy, unspecified: Secondary | ICD-10-CM | POA: Diagnosis not present

## 2015-01-25 DIAGNOSIS — I10 Essential (primary) hypertension: Secondary | ICD-10-CM

## 2015-01-25 DIAGNOSIS — K219 Gastro-esophageal reflux disease without esophagitis: Secondary | ICD-10-CM

## 2015-01-25 DIAGNOSIS — R5381 Other malaise: Secondary | ICD-10-CM | POA: Diagnosis not present

## 2015-01-25 DIAGNOSIS — N400 Enlarged prostate without lower urinary tract symptoms: Secondary | ICD-10-CM | POA: Diagnosis not present

## 2015-01-25 DIAGNOSIS — D62 Acute posthemorrhagic anemia: Secondary | ICD-10-CM

## 2015-01-25 DIAGNOSIS — I25119 Atherosclerotic heart disease of native coronary artery with unspecified angina pectoris: Secondary | ICD-10-CM

## 2015-01-25 DIAGNOSIS — E1142 Type 2 diabetes mellitus with diabetic polyneuropathy: Secondary | ICD-10-CM | POA: Diagnosis not present

## 2015-01-25 DIAGNOSIS — M869 Osteomyelitis, unspecified: Secondary | ICD-10-CM | POA: Diagnosis not present

## 2015-01-25 DIAGNOSIS — J189 Pneumonia, unspecified organism: Secondary | ICD-10-CM | POA: Diagnosis not present

## 2015-01-25 NOTE — Progress Notes (Signed)
Patient ID: [name], male   DOB: May 15, 1956, 59 y.o.   MRN: [id number]     [facility]   PCP: [name], MD  Code Status: full code  Allergies  Allergen Reactions  . Ivp Dye [Iodinated Diagnostic Agents] Anaphylaxis    Breathing problems  . Adhesive [Tape] Rash    Chief Complaint  Patient presents with  . New Admit To SNF     HPI:  59 year old patient is here for short term rehabilitation post hospital admission from 01/14/15-01/20/15 with sepsis in setting of right foot ulcer and osteomyelitis and possible pneumonia. He underwent right transtibial amputation. His pain is under control. He is concerned about low blood sugar readings in the facility but refuses any change/ adjustment to his medications. He would like to see his endocrinologist and will make appointment with him. Of note his lasix has been increased to 80 mg daily and protnix changed to 40 mg qod per patient request.  He has PMH of CAD s/p PCI, DM, HTN, BPH, HLD, diabetic peripheral neuropathy   Review of Systems:  Constitutional: Negative for fever, chills, diaphoresis.  HENT: Negative for headache, congestion, nasal discharge Eyes: Negative for eye pain, blurred vision, double vision and discharge.  Respiratory: Negative for cough, shortness of breath and wheezing.   Cardiovascular: Negative for chest pain, palpitations Gastrointestinal: Negative for heartburn, nausea, vomiting, abdominal pain Genitourinary: Negative for dysuria Musculoskeletal: Negative for back pain, falls Skin: Negative for itching, rash.  Neurological: Negative for dizziness, tingling, focal weakness Psychiatric/Behavioral: Negative for depression    Past Medical History  Diagnosis Date  . Carotid artery occlusion 11/10/10    LEFT CAROTID ENDARTERECTOMY  . Diabetes mellitus   . Hypertension   . Hyperlipidemia   . COPD (chronic obstructive pulmonary disease)   . DJD (degenerative joint  disease)   . GERD (gastroesophageal reflux disease)   . Substernal chest pain   . 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 Slurred speech     AS PER WIFE IN D/C NOTE 11/10/10  . PAD (peripheral artery disease)     Distal aortogram June 2012. Atherectomy left popliteal artery July 2012.   . Barrett's esophagus   . Fatty liver   . Diverticulosis of colon (without mention of hemorrhage)   . Emphysema of lung   . Osteomyelitis   . Complication of anesthesia     BP WENT UP AT [facility] "  . Sleep apnea     has not used CPAP in e years - took self off  . H/O hiatal hernia   . Neuromuscular disorder     peripheral neuropathy  . Full dentures   . Wears glasses    Past Surgical History  Procedure Laterality Date  . Laminectomy      X 3 LUMBAR AND X 2 CERVICAL SPINE OPERATIONS  . Carotid endarterectomy  11/10/10  . Hernia repair      LEFT INGUINAL AND UMBILICAL REPAIRS  . Anterior fusion cervical spine  02/06/06    C4-5, C5-6, C6-7; SURGEON DR. MAX COHEN  . Penile prosthesis implant  08/14/05    INFRAPUBIC INSERTION OF INFLATABLE PENILE PROSTHESIS; SURGEON DR. Amalia Hailey  . Laparoscopic cholecystectomy w/ cholangiography  11/09/04    SURGEON DR. Luella Cook  . Total knee arthroplasty  07/2002    RIGHT KNEE ; SURGEON  DR. [name] ALSO HAD ARTHROSCOPIC RIGHT KNEE IN  10/2001  .  Shoulder arthroscopy    . Cholecystectomy    . Amputation  11/05/2011    Procedure: AMPUTATION RAY;  Surgeon: Wylene Simmer, MD;  Location: [place];  Service: Orthopedics;  Laterality: Right;  Amputation of Right 4&5th Toes  . Femoral artery stent      x6  . Amputation Left 11/26/2012    Procedure: AMPUTATION RAY;  Surgeon: Wylene Simmer, MD;  Location: [place];  Service: Orthopedics;  Laterality: Left;  fourth ray amputation  . Cardiac catheterization  10/31/04    2009  . Carpal tunnel release Right 10/21/2013    Procedure: RIGHT CARPAL TUNNEL RELEASE;  Surgeon: Wynonia Sours, MD;  Location: [place];  Service: Orthopedics;  Laterality: Right;  . Ulnar nerve transposition Right 10/21/2013    Procedure: RIGHT ELBOW  ULNAR NERVE DECOMPRESSION;  Surgeon: Wynonia Sours, MD;  Location: [place];  Service: Orthopedics;  Laterality: Right;  . Joint replacement Right 2001    Total  . Spine surgery    . Neck surgery    . Esophageal manometry Bilateral 07/19/2014    Procedure: ESOPHAGEAL MANOMETRY (EM);  Surgeon: Jerene Bears, MD;  Location: [facility];  Service: Gastroenterology;  Laterality: Bilateral;  . Lower extremity angiogram N/A 03/19/2012    Procedure: LOWER EXTREMITY ANGIOGRAM;  Surgeon: Burnell Blanks, MD;  Location: [facility] CATH LAB;  Service: Cardiovascular;  Laterality: N/A;  . Amputation Right 08/27/2014    Procedure: Transmetatarsal Amputation;  Surgeon: Newt Minion, MD;  Location: [place];  Service: Orthopedics;  Laterality: Right;  . Left heart catheterization with coronary angiogram N/A 10/29/2014    Procedure: LEFT HEART CATHETERIZATION WITH CORONARY ANGIOGRAM;  Surgeon: Laverda Page, MD;  Location: [facility] CATH LAB;  Service: Cardiovascular;  Laterality: N/A;  . Percutaneous coronary stent intervention (pci-s) Right 10/29/2014    Procedure: PERCUTANEOUS CORONARY STENT INTERVENTION (PCI-S);  Surgeon: Laverda Page, MD;  Location: [facility] CATH LAB;  Service: Cardiovascular;  Laterality: Right;  . Amputation Right 01/14/2015    Procedure: AMPUTATION BELOW KNEE;  Surgeon: Newt Minion, MD;  Location: [place];  Service: Orthopedics;  Laterality: Right;   Social History:   reports that he has been smoking E-cigarettes.  He has a 17.5 pack-year smoking history. He has never used smokeless tobacco. He reports that he does not drink alcohol or use illicit drugs.  Family History  Problem Relation Age of Onset  . Heart disease Father     Before age 61-  CAD, BPG  . Diabetes Father     Amputation  . Cancer Father     PROSTATE  . Hyperlipidemia Father   .  Hypertension Father   . Heart attack Father     Triple BPG  . Arthritis      GRANDMOTHER  . Hypertension      OTHER FAMILY MEMBERS  . Colon cancer Brother   . Cancer Brother     Colon  . Diabetes Brother   . Heart disease Brother 7    A-Fib. Before age 63  . Hyperlipidemia Brother   . Hypertension Brother   . Cancer Sister     Breast    Medications: Patient's Medications  New Prescriptions   No medications on file  Previous Medications   ALPRAZOLAM (XANAX) 0.5 MG TABLET    Take 0.5 tablets (0.25 mg total) by mouth every morning.   ASPIRIN EC 81 MG TABLET    Take 81 mg by mouth daily.  ATORVASTATIN (LIPITOR) 40 MG TABLET    Take 40 mg by mouth daily.   AZOR 5-20 MG PER TABLET    Take 1 tablet by mouth at bedtime.    CARVEDILOL (COREG) 12.5 MG TABLET    Take 3 tablets (37.5 mg total) by mouth every morning.   CARVEDILOL (COREG) 6.25 MG TABLET    Take 6.25 mg by mouth at bedtime.   CLOBETASOL (TEMOVATE) 0.05 % EXTERNAL SOLUTION    Apply 1 application topically 2 (two) times daily as needed.   CLOPIDOGREL (PLAVIX) 75 MG TABLET    Take 75 mg by mouth daily. 1 tab daily   CLOTRIMAZOLE-BETAMETHASONE (LOTRISONE) CREAM    Apply 1 application topically 2 (two) times daily.   EZETIMIBE (ZETIA) 10 MG TABLET    Take 10 mg by mouth every morning.   FENTANYL (DURAGESIC - DOSED MCG/HR) 50 MCG/HR    Place 1 patch (50 mcg total) onto the skin every 3 (three) days.   FUROSEMIDE (LASIX) 80 MG TABLET    Take 0.5 tablets (40 mg total) by mouth daily.   GLYBURIDE-METFORMIN (GLUCOVANCE) 2.5-500 MG PER TABLET    Take 2 tablets by mouth 2 (two) times daily.    HYDRALAZINE (APRESOLINE) 25 MG TABLET    Take 1 tablet (25 mg total) by mouth 3 (three) times daily.   HYDROMORPHONE (DILAUDID) 2 MG TABLET    Take 1 tablet (2 mg total) by mouth every 4 (four) hours as needed.   INSULIN GLARGINE (LANTUS) 100 UNIT/ML INJECTION    Inject 50 Units into the skin at bedtime.    INSULIN GLULISINE (APIDRA) 100  UNIT/ML INJECTION    Inject 10-15 Units into the skin 3 (three) times daily before meals. 10 breakfast 10 lunch and 15 supper   ISOSORBIDE MONONITRATE (IMDUR) 120 MG 24 HR TABLET    Take 120 mg by mouth daily.   LINACLOTIDE (LINZESS) 145 MCG CAPS CAPSULE    Take 1 capsule (145 mcg total) by mouth daily as needed.   MELOXICAM (MOBIC) 15 MG TABLET    Take 15 mg by mouth daily.   MULTIPLE VITAMIN (MULTIVITAMIN) TABLET    Take 1 tablet by mouth daily.   NITROGLYCERIN (NITROSTAT) 0.4 MG SL TABLET    Place 1 tablet (0.4 mg total) under the tongue every 5 (five) minutes as needed (as needed for esophageal spasm).   OMEGA-3 ACID ETHYL ESTERS (LOVAZA) 1 G CAPSULE    Take 2,000 mg by mouth 2 (two) times daily.    ONDANSETRON (ZOFRAN-ODT) 4 MG DISINTEGRATING TABLET    Take 4 mg by mouth 4 (four) times daily as needed for nausea.   OXYCODONE-ACETAMINOPHEN (PERCOCET/ROXICET) 5-325 MG PER TABLET    Take 1 tablet by mouth every 8 (eight) hours as needed.   PANTOPRAZOLE (PROTONIX) 40 MG TABLET    Take 1 tablet (40 mg total) by mouth daily before breakfast.   POLYETHYL GLYCOL-PROPYL GLYCOL (SYSTANE ULTRA OP)    Place 1 drop into both eyes as needed (for dry eyes).   PREGABALIN (LYRICA) 100 MG CAPSULE    Take 100 mg by mouth 3 (three) times daily.   ROPINIROLE (REQUIP) 2 MG TABLET    Take 2 mg by mouth at bedtime.    TAMSULOSIN HCL (FLOMAX) 0.4 MG CAPS    Take 0.4 mg by mouth daily.  Modified Medications   No medications on file  Discontinued Medications   No medications on file     Physical Exam: Filed Vitals:  01/25/15 2013  BP: 144/60  Pulse: 76  Temp: 98.7 F (37.1 C)  Resp: 18  Weight: 254 lb 9.6 oz (115.486 kg)  SpO2: 96%    General- adult male, in no acute distress Head- normocephalic, atraumatic Nose- no maxillary or frontal sinus tenderness, no nasal discharge Throat- moist mucus membrane Eyes- PERRLA, EOMI, no pallor, no icterus, no discharge, normal conjunctiva, normal sclera Neck- no  cervical lymphadenopathy Cardiovascular- normal s1,s2, no murmurs, trace left leg edema Respiratory- bilateral clear to auscultation, no wheeze, no rhonchi, no crackles, no use of accessory muscles Abdomen- bowel sounds present, soft, non tender Musculoskeletal- able to move all 4 extremities, right stump dressing in place Neurological- no focal deficit Skin- warm and dry Psychiatry- alert and oriented to person, place and time, normal mood and affect    Labs reviewed: Basic Metabolic Panel:  Recent Labs  01/16/15 0530 01/17/15 0443 01/20/15 0545  NA 135 136 138  K 3.9 3.8 4.4  CL 101 103 104  CO2 26 26 27   GLUCOSE 139* 108* 123*  BUN 17 12 15   CREATININE 1.61* 1.24 1.18  CALCIUM 8.2* 8.4* 8.5*  MG  --   --  1.7   Liver Function Tests:  Recent Labs  08/28/14 0516 09/26/14 1405 09/26/14 1920 01/14/15 0210  AST 16 35  --  21  ALT 14 32  --  17  ALKPHOS 107 119*  --  114  BILITOT 0.2* 0.7  --  0.9  PROT 5.9* 6.4  --  6.7  ALBUMIN 2.8* 3.7 3.6 3.7   No results for input(s): LIPASE, AMYLASE in the last 8760 hours. No results for input(s): AMMONIA in the last 8760 hours. CBC:  Recent Labs  08/25/14 0739  09/26/14 1405  01/14/15 0210  01/16/15 0530 01/17/15 0443 01/20/15 0545  WBC 7.8  < > 5.0  < > 14.3*  < > 6.3 5.6 5.6  NEUTROABS 5.5  --  3.3  --  12.2*  --   --   --   --   HGB 10.8*  < > 10.6*  < > 10.5*  < > 8.1* 8.0* 7.8*  HCT 30.3*  < > 30.0*  < > 30.3*  < > 23.6* 23.0* 22.7*  MCV 82.8  < > 82.0  < > 83.9  < > 84.9 83.6 83.2  PLT 184  < > 167  < > 191  < > 165 192 235  < > = values in this interval not displayed. Cardiac Enzymes:  Recent Labs  09/27/14 0839 09/27/14 1425 09/27/14 1957  TROPONINI <0.03 <0.03 <0.03   BNP: Invalid input(s): POCBNP CBG:  Recent Labs  01/20/15 0816 01/20/15 1215 01/20/15 1610  GLUCAP 105* 143* 90   Lab Results  Component Value Date   HGBA1C 6.5* 09/26/2014    Assessment/Plan  Physical  deconditioning Will have him work with physical therapy and occupational therapy team to help with gait training and muscle strengthening exercises.fall precautions. Skin care. Encourage to be out of bed.   Right foot osteomyelitis S/p right trans tibial amputation. Has f/u with Dr [name]. Continue fentanyl patch and dilaudid 2 mg q4h prn pain.  CAP Stable, completed his antibiotics. Monitor clinically  Blood loss anemia Post op,will need recheck of cbc  DM type 2 with peripheral neuropathy Reviewed. a1c 1/16 6.5. Currently on lantus 50 u daily, glyburide- metformin 2.5-500 2 tab bid and apidra 10-15 units tid with meals. There has been some low cbg reading. Advised patient  after reviewing his labs and cbg reading to discontinue oral hypoglycemic agent and decrease lantus dosing some, follow on cbg and adjust dosing further. Patient refuses any change/ adjustment to his diabetes medication. He would like to see his endocrinologist. He understands the risk of taking high doses of hypoglycemic agents if not required. Continue lyrica 100 mg tid. Recheck a1c  CAD S/p PCI in feb 2016. Continue aspirin 81 mg daily, plavix 75 mg daily, statin and coreg 37.5 in am and 6.25 mg in pm with prn NTG  HTN Stable. Continue coreg, amlodipine, hydralazine 25 mg tid, azor 5-20 daily and imdur 120 mg daily  Hyperlipidemia Continue lipitor 40 mg daily  gerd Stable, continue protonix 40 mg daily  BPH Continue flomax   Goals of care: short term rehabilitation   Labs/tests ordered: a1c, cbc, cmp  Family/ staff Communication: reviewed care plan with patient and nursing supervisor    Blanchie Serve, MD  [facility] [phone] (Monday-Friday 8 am - 5 pm) [phone] (afterhours)

## 2015-01-26 NOTE — Progress Notes (Signed)
Patient ID: [name], male   DOB: [birth year], 59 y.o.   MRN: [id number]   01/21/15  Facility:  [facility]ursing Home Location:  [place] Room Number: 606-P LEVEL OF CARE:  SNF (31)   Chief Complaint  Patient presents with  . Hospitalization Follow-up    Physical deconditioning, right foot osteomyelitis S/P right BKA, diabetes mellitus, anemia, hypertension, hyperlipidemia, BPH, anxiety, CAD, constipation, restless leg syndrome, GERD and neuropathy    HISTORY OF PRESENT ILLNESS:  This is a 59 year old male who was been admitted to [facility] on 01/20/15 from [facility]. He has PMH of hyperlipidemia, diabetes mellitus, hypertension, DJD, COPD and PAD. He had sepsis secondary to right foot ulcer with osteomyelitis and had right BKA on 5/6.   He has been admitted for a short-term rehabilitation.   PAST MEDICAL HISTORY:  Past Medical History  Diagnosis Date  . Carotid artery occlusion 11/10/10    LEFT CAROTID ENDARTERECTOMY  . Diabetes mellitus   . Hypertension   . Hyperlipidemia   . COPD (chronic obstructive pulmonary disease)   . DJD (degenerative joint disease)   . GERD (gastroesophageal reflux disease)   . Substernal chest pain   . 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 Slurred speech     AS PER WIFE IN D/C NOTE 11/10/10  . PAD (peripheral artery disease)     Distal aortogram June 2012. Atherectomy left popliteal artery July 2012.   . Barrett's esophagus   . Fatty liver   . Diverticulosis of colon (without mention of hemorrhage)   . Emphysema of lung   . Osteomyelitis   . Complication of anesthesia     BP WENT UP AT [facility] "  . Sleep apnea     has not used CPAP in e years - took self off  . H/O hiatal hernia   . Neuromuscular disorder     peripheral neuropathy  . Full dentures   . Wears glasses     CURRENT MEDICATIONS: Reviewed per MAR/see medication list  Allergies  Allergen Reactions  . Ivp Dye  [Iodinated Diagnostic Agents] Anaphylaxis    Breathing problems  . Adhesive [Tape] Rash     REVIEW OF SYSTEMS:  GENERAL: no change in appetite, no fatigue, no weight changes, no fever, chills or weakness RESPIRATORY: no cough, SOB, DOE, wheezing, hemoptysis CARDIAC: no chest pain, or palpitations GI: no abdominal pain, diarrhea, constipation, heart burn, nausea or vomiting  PHYSICAL EXAMINATION  GENERAL: no acute distress EYES: conjunctivae normal, sclerae normal, normal eye lids NECK: supple, trachea midline, no neck masses, no thyroid tenderness, no thyromegaly LYMPHATICS: no LAN in the neck, no supraclavicular LAN RESPIRATORY: breathing is even & unlabored, BS CTAB CARDIAC: RRR, no murmur,no extra heart sounds, LLE edema 2+ GI: abdomen soft, normal BS, no masses, no tenderness, no hepatomegaly, no splenomegaly EXTREMITIES:  Right BKA PSYCHIATRIC: the patient is alert & oriented to person, affect & behavior appropriate  LABS/RADIOLOGY: Labs reviewed: Basic Metabolic Panel:  Recent Labs  01/16/15 0530 01/17/15 0443 01/20/15 0545  NA 135 136 138  K 3.9 3.8 4.4  CL 101 103 104  CO2 26 26 27   GLUCOSE 139* 108* 123*  BUN 17 12 15   CREATININE 1.61* 1.24 1.18  CALCIUM 8.2* 8.4* 8.5*  MG  --   --  1.7   Liver Function Tests:  Recent Labs  08/28/14 0516 09/26/14 1405 09/26/14 1920 01/14/15 0210  AST 16 35  --  21  ALT 14 32  --  17  ALKPHOS 107 119*  --  114  BILITOT 0.2* 0.7  --  0.9  PROT 5.9* 6.4  --  6.7  ALBUMIN 2.8* 3.7 3.6 3.7   CBC:  Recent Labs  08/25/14 0739  09/26/14 1405  01/14/15 0210  01/16/15 0530 01/17/15 0443 01/20/15 0545  WBC 7.8  < > 5.0  < > 14.3*  < > 6.3 5.6 5.6  NEUTROABS 5.5  --  3.3  --  12.2*  --   --   --   --   HGB 10.8*  < > 10.6*  < > 10.5*  < > 8.1* 8.0* 7.8*  HCT 30.3*  < > 30.0*  < > 30.3*  < > 23.6* 23.0* 22.7*  MCV 82.8  < > 82.0  < > 83.9  < > 84.9 83.6 83.2  PLT 184  < > 167  < > 191  < > 165 192 235  < > =  values in this interval not displayed.  Cardiac Enzymes:  Recent Labs  09/27/14 0839 09/27/14 1425 09/27/14 1957  TROPONINI <0.03 <0.03 <0.03   CBG:  Recent Labs  01/20/15 0816 01/20/15 1215 01/20/15 1610  GLUCAP 105* 143* 90    Dg Chest 2 View  01/14/2015   CLINICAL DATA:  Chills, vomiting and fever.  EXAM: CHEST  2 VIEW  COMPARISON:  Chest radiograph September 26, 2014  FINDINGS: Cardiac silhouette is mildly enlarged, unchanged. Tortuous mildly calcified aorta. Mild chronic bronchitic change without pleural effusion. Patchy RIGHT middle lobe airspace opacity. No pneumothorax. Cervical spine instrumentation partially imaged. Moderate degenerative change of the thoracic spine.  IMPRESSION: Mild cardiomegaly. Similar bronchitic changes with superimposed patchy RIGHT middle lobe airspace opacity which may reflect atelectasis or possibly pneumonia.   Electronically Signed   By: Elon Alas   On: 01/14/2015 04:07    ASSESSMENT/PLAN:  Physical deconditioning - for rehabilitation Right foot Osteomyelitis S/P Right BKA - wound treatment as ordered; follow-up with [name], orthopedic surgeon; continue Dilaudid 2 mg by mouth every 4 hours when necessary and Percocet 5/325 mg 1 tab by mouth every 8 hours when necessary for pain Diabetes mellitus, type II - hemoglobin A1c 6.5; continue Glucovance 2.5/500 mg 2 tabs by mouth twice a day, Apidra 10 units subcutaneous at lunch and 15 units at supper and Lantus 50 units subcutaneous daily at bedtime Anemia, acute blood loss - hemoglobin 7.8; will monitor Hypertension - continue Apresoline 25 mg by mouth 3 times a day, Imdur 120 mg/24 hour 1 by mouth daily, Azor 5-20 mg by mouth daily at bedtime, Coreg 12.5 mg 3 tabs = 37.5 mg by mouth every morning and Lasix 80 mg by mouth every morning Hyperlipidemia - continue Lovaza 1 g 2 = 2000 mg by mouth twice a day, Lipitor 40 mg by mouth daily and Zetia 10 mg by mouth everyday BPH - continue Flomax 0.4  mg by mouth daily Anxiety - continue Xanax 0.5 mg 1/2 tab = 0.25 mg by mouth daily CAD - stable; continue Plavix 75 mg by mouth daily, Imdur 120 mg/24 hour 1 by mouth daily and NTG when necessary Chronic constipation - continue Linzess 145 g by mouth daily when necessary Restless leg syndrome  continue reck with 2 milligrams by mouth daily at bedtime GERD - continue Protonix 40 mg by mouth every other day Neuropathy - continue Lyrica 100 mg by mouth daily and fentanyl  50 g/24 hour 1 patch transdermally every 3 days   Goals of care:  Short-term rehabilitation  Spent 50 minutes in patient care.    [facility], NP [employer] [phone]

## 2015-01-28 ENCOUNTER — Non-Acute Institutional Stay: Payer: BLUE CROSS/BLUE SHIELD | Admitting: Adult Health

## 2015-01-28 ENCOUNTER — Encounter: Payer: Self-pay | Admitting: Adult Health

## 2015-01-28 DIAGNOSIS — K5900 Constipation, unspecified: Secondary | ICD-10-CM | POA: Diagnosis not present

## 2015-01-28 DIAGNOSIS — G2581 Restless legs syndrome: Secondary | ICD-10-CM | POA: Diagnosis not present

## 2015-01-28 DIAGNOSIS — D62 Acute posthemorrhagic anemia: Secondary | ICD-10-CM | POA: Diagnosis not present

## 2015-01-28 DIAGNOSIS — E1142 Type 2 diabetes mellitus with diabetic polyneuropathy: Secondary | ICD-10-CM

## 2015-01-28 DIAGNOSIS — N400 Enlarged prostate without lower urinary tract symptoms: Secondary | ICD-10-CM

## 2015-01-28 DIAGNOSIS — E785 Hyperlipidemia, unspecified: Secondary | ICD-10-CM

## 2015-01-28 DIAGNOSIS — K5909 Other constipation: Secondary | ICD-10-CM

## 2015-01-28 DIAGNOSIS — I25810 Atherosclerosis of coronary artery bypass graft(s) without angina pectoris: Secondary | ICD-10-CM | POA: Diagnosis not present

## 2015-01-28 DIAGNOSIS — R5381 Other malaise: Secondary | ICD-10-CM

## 2015-01-28 DIAGNOSIS — M869 Osteomyelitis, unspecified: Secondary | ICD-10-CM | POA: Diagnosis not present

## 2015-01-28 DIAGNOSIS — F419 Anxiety disorder, unspecified: Secondary | ICD-10-CM

## 2015-01-28 DIAGNOSIS — I10 Essential (primary) hypertension: Secondary | ICD-10-CM | POA: Diagnosis not present

## 2015-01-28 DIAGNOSIS — G629 Polyneuropathy, unspecified: Secondary | ICD-10-CM

## 2015-01-28 DIAGNOSIS — K219 Gastro-esophageal reflux disease without esophagitis: Secondary | ICD-10-CM | POA: Diagnosis not present

## 2015-01-28 NOTE — Progress Notes (Signed)
Patient ID: [name], male   DOB: Jun 21, 1956, 59 y.o.   MRN: [id number]   01/28/15  Facility:  Nursing Home Location:  [place] Room Number: 606-P LEVEL OF CARE:  SNF (31)   Chief Complaint  Patient presents with  . Discharge Note    Physical deconditioning, right foot osteomyelitis S/P right BKA, diabetes mellitus, anemia, hypertension, hyperlipidemia, BPH, anxiety, CAD, constipation, restless leg syndrome, GERD and neuropathy    HISTORY OF PRESENT ILLNESS:  This is a 59 year old male who is for discharge home with home health PT for endurance, OT for ADLs, CNA for showers and skilled nurse for wound management.  DME: Standard wheelchair with cushion and leg rests. He has been admitted to [facility] on 01/20/15 from [facility]. He has PMH of hyperlipidemia, diabetes mellitus, hypertension, DJD, COPD and PAD. He had sepsis secondary to right foot ulcer with osteomyelitis and had right BKA on 5/6.   Patient was admitted to this facility for short-term rehabilitation after the patient's recent hospitalization.  Patient has completed SNF rehabilitation and therapy has cleared the patient for discharge.   PAST MEDICAL HISTORY:  Past Medical History  Diagnosis Date  . Carotid artery occlusion 11/10/10    LEFT CAROTID ENDARTERECTOMY  . Diabetes mellitus   . Hypertension   . Hyperlipidemia   . COPD (chronic obstructive pulmonary disease)   . DJD (degenerative joint disease)   . GERD (gastroesophageal reflux disease)   . Substernal chest pain   . 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 Slurred speech     AS PER WIFE IN D/C NOTE 11/10/10  . PAD (peripheral artery disease)     Distal aortogram June 2012. Atherectomy left popliteal artery July 2012.   . Barrett's esophagus   . Fatty liver   . Diverticulosis of colon (without mention of hemorrhage)   . Emphysema of lung   . Osteomyelitis   . Complication of anesthesia     BP WENT UP AT [facility] "  . Sleep apnea     has not used CPAP in e years - took self off  . H/O hiatal hernia   . Neuromuscular disorder     peripheral neuropathy  . Full dentures   . Wears glasses     CURRENT MEDICATIONS: Reviewed per MAR/see medication list  Allergies  Allergen Reactions  . Ivp Dye [Iodinated Diagnostic Agents] Anaphylaxis    Breathing problems  . Adhesive [Tape] Rash     REVIEW OF SYSTEMS:  GENERAL: no change in appetite, no fatigue, no weight changes, no fever, chills or weakness RESPIRATORY: no cough, SOB, DOE, wheezing, hemoptysis CARDIAC: no chest pain, or palpitations GI: no abdominal pain, diarrhea, constipation, heart burn, nausea or vomiting  PHYSICAL EXAMINATION  GENERAL: no acute distress NECK: supple, trachea midline, no neck masses, no thyroid tenderness, no thyromegaly LYMPHATICS: no LAN in the neck, no supraclavicular LAN RESPIRATORY: breathing is even & unlabored, BS CTAB CARDIAC: RRR, no murmur,no extra heart sounds, LLE edema 1+ GI: abdomen soft, normal BS, no masses, no tenderness, no hepatomegaly, no splenomegaly EXTREMITIES:  Right BKA with ace wrap PSYCHIATRIC: the patient is alert & oriented to person, affect & behavior appropriate  LABS/RADIOLOGY: Labs reviewed: Basic Metabolic Panel:  Recent Labs  01/16/15 0530 01/17/15 0443 01/20/15 0545  NA 135 136 138  K 3.9 3.8 4.4  CL 101 103 104  CO2 26 26 27  GLUCOSE 139* 108* 123*  BUN 17 12 15   CREATININE 1.61* 1.24 1.18  CALCIUM 8.2* 8.4* 8.5*  MG  --   --  1.7   Liver Function Tests:  Recent Labs  08/28/14 0516 09/26/14 1405 09/26/14 1920 01/14/15 0210  AST 16 35  --  21  ALT 14 32  --  17  ALKPHOS 107 119*  --  114  BILITOT 0.2* 0.7  --  0.9  PROT 5.9* 6.4  --  6.7  ALBUMIN 2.8* 3.7 3.6 3.7   CBC:  Recent Labs  08/25/14 0739  09/26/14 1405  01/14/15 0210  01/16/15 0530 01/17/15 0443 01/20/15 0545  WBC 7.8  < > 5.0  < > 14.3*  < > 6.3 5.6 5.6    NEUTROABS 5.5  --  3.3  --  12.2*  --   --   --   --   HGB 10.8*  < > 10.6*  < > 10.5*  < > 8.1* 8.0* 7.8*  HCT 30.3*  < > 30.0*  < > 30.3*  < > 23.6* 23.0* 22.7*  MCV 82.8  < > 82.0  < > 83.9  < > 84.9 83.6 83.2  PLT 184  < > 167  < > 191  < > 165 192 235  < > = values in this interval not displayed.  Cardiac Enzymes:  Recent Labs  09/27/14 0839 09/27/14 1425 09/27/14 1957  TROPONINI <0.03 <0.03 <0.03   CBG:  Recent Labs  01/20/15 0816 01/20/15 1215 01/20/15 1610  GLUCAP 105* 143* 90    Dg Chest 2 View  01/14/2015   CLINICAL DATA:  Chills, vomiting and fever.  EXAM: CHEST  2 VIEW  COMPARISON:  Chest radiograph September 26, 2014  FINDINGS: Cardiac silhouette is mildly enlarged, unchanged. Tortuous mildly calcified aorta. Mild chronic bronchitic change without pleural effusion. Patchy RIGHT middle lobe airspace opacity. No pneumothorax. Cervical spine instrumentation partially imaged. Moderate degenerative change of the thoracic spine.  IMPRESSION: Mild cardiomegaly. Similar bronchitic changes with superimposed patchy RIGHT middle lobe airspace opacity which may reflect atelectasis or possibly pneumonia.   Electronically Signed   By: Elon Alas   On: 01/14/2015 04:07    ASSESSMENT/PLAN:  Physical deconditioning - for home health PT, OT, CNA and skilled nurse Right foot Osteomyelitis S/P Right BKA - follow-up with [name], orthopedic surgeon; continue Dilaudid 2 mg by mouth every 4 hours when necessary and Percocet 5/325 mg 1 tab by mouth every 8 hours when necessary for pain Diabetes mellitus, type II - hemoglobin A1c 6.5; continue Glucovance 2.5/500 mg 2 tabs by mouth twice a day, Apidra 10 units subcutaneous at lunch and 15 units at supper and Lantus 50 units subcutaneous daily at bedtime Anemia, acute blood loss - hemoglobin 7.8; will monitor Hypertension - continue Apresoline 25 mg by mouth 3 times a day, Imdur 120 mg/24 hour 1 by mouth daily, Azor 5-20 mg by mouth  daily at bedtime, Coreg 12.5 mg 3 tabs = 37.5 mg by mouth every morning and Lasix 80 mg by mouth every morning Hyperlipidemia - continue Lovaza 1 g 2 = 2000 mg by mouth twice a day, Lipitor 40 mg by mouth daily and Zetia 10 mg by mouth everyday BPH - continue Flomax 0.4 mg by mouth daily Anxiety - continue Xanax 0.5 mg 1/2 tab = 0.25 mg by mouth daily CAD - stable; continue Plavix 75 mg by mouth daily, Imdur 120 mg/24 hour 1 by mouth daily and NTG  when necessary Chronic constipation - continue Linzess 145 g by mouth daily when necessary Restless leg syndrome  continue reck with 2 milligrams by mouth daily at bedtime GERD - continue Protonix 40 mg by mouth every other day Neuropathy - continue Lyrica 100 mg by mouth daily and fentanyl 50 g/24 hour 1 patch transdermally every 3 days    I have filled out patient's discharge paperwork. Patient refused prescriptions and verbalized that he is going to get them from his primary care doctor, Dr. Wilson Singer. Patient will receive home health PT, OT, skilled Nurse and CNA.  DME provided:  Standard wheelchair with cushion and leg rests  Total discharge time: Greater than 30 minutes  Discharge time involved coordination of the discharge process with social worker, nursing staff and therapy department. Medical justification for home health services/DME verified.   [facility], NP [employer] [phone]

## 2015-02-15 ENCOUNTER — Emergency Department (HOSPITAL_COMMUNITY): Payer: BLUE CROSS/BLUE SHIELD

## 2015-02-15 ENCOUNTER — Ambulatory Visit: Payer: BC Managed Care – PPO | Admitting: Family

## 2015-02-15 ENCOUNTER — Emergency Department (HOSPITAL_COMMUNITY)
Admission: EM | Admit: 2015-02-15 | Discharge: 2015-02-15 | Disposition: A | Discharge status: Home / Self Care | Payer: BLUE CROSS/BLUE SHIELD | Attending: Emergency Medicine | Admitting: Emergency Medicine

## 2015-02-15 ENCOUNTER — Encounter (HOSPITAL_COMMUNITY): Payer: Self-pay | Admitting: *Deleted

## 2015-02-15 ENCOUNTER — Encounter (HOSPITAL_COMMUNITY): Payer: BLUE CROSS/BLUE SHIELD

## 2015-02-15 DIAGNOSIS — Z79899 Other long term (current) drug therapy: Secondary | ICD-10-CM | POA: Diagnosis not present

## 2015-02-15 DIAGNOSIS — Z794 Long term (current) use of insulin: Secondary | ICD-10-CM | POA: Insufficient documentation

## 2015-02-15 DIAGNOSIS — Z8669 Personal history of other diseases of the nervous system and sense organs: Secondary | ICD-10-CM | POA: Insufficient documentation

## 2015-02-15 DIAGNOSIS — J449 Chronic obstructive pulmonary disease, unspecified: Secondary | ICD-10-CM | POA: Insufficient documentation

## 2015-02-15 DIAGNOSIS — I10 Essential (primary) hypertension: Secondary | ICD-10-CM | POA: Diagnosis not present

## 2015-02-15 DIAGNOSIS — E785 Hyperlipidemia, unspecified: Secondary | ICD-10-CM | POA: Diagnosis not present

## 2015-02-15 DIAGNOSIS — Z720 Tobacco use: Secondary | ICD-10-CM | POA: Diagnosis not present

## 2015-02-15 DIAGNOSIS — Y835 Amputation of limb(s) as the cause of abnormal reaction of the patient, or of later complication, without mention of misadventure at the time of the procedure: Secondary | ICD-10-CM | POA: Diagnosis not present

## 2015-02-15 DIAGNOSIS — Y9389 Activity, other specified: Secondary | ICD-10-CM | POA: Insufficient documentation

## 2015-02-15 DIAGNOSIS — Z89512 Acquired absence of left leg below knee: Secondary | ICD-10-CM | POA: Diagnosis not present

## 2015-02-15 DIAGNOSIS — Y9289 Other specified places as the place of occurrence of the external cause: Secondary | ICD-10-CM | POA: Diagnosis not present

## 2015-02-15 DIAGNOSIS — T148 Other injury of unspecified body region: Secondary | ICD-10-CM | POA: Diagnosis not present

## 2015-02-15 DIAGNOSIS — W01198A Fall on same level from slipping, tripping and stumbling with subsequent striking against other object, initial encounter: Secondary | ICD-10-CM | POA: Insufficient documentation

## 2015-02-15 DIAGNOSIS — E119 Type 2 diabetes mellitus without complications: Secondary | ICD-10-CM | POA: Diagnosis not present

## 2015-02-15 DIAGNOSIS — S39012A Strain of muscle, fascia and tendon of lower back, initial encounter: Secondary | ICD-10-CM | POA: Insufficient documentation

## 2015-02-15 DIAGNOSIS — IMO0001 Reserved for inherently not codable concepts without codable children: Secondary | ICD-10-CM

## 2015-02-15 DIAGNOSIS — K219 Gastro-esophageal reflux disease without esophagitis: Secondary | ICD-10-CM | POA: Diagnosis not present

## 2015-02-15 DIAGNOSIS — S161XXA Strain of muscle, fascia and tendon at neck level, initial encounter: Secondary | ICD-10-CM | POA: Diagnosis not present

## 2015-02-15 DIAGNOSIS — Y998 Other external cause status: Secondary | ICD-10-CM | POA: Insufficient documentation

## 2015-02-15 DIAGNOSIS — S199XXA Unspecified injury of neck, initial encounter: Secondary | ICD-10-CM | POA: Diagnosis present

## 2015-02-15 DIAGNOSIS — Z9889 Other specified postprocedural states: Secondary | ICD-10-CM | POA: Diagnosis not present

## 2015-02-15 DIAGNOSIS — W19XXXA Unspecified fall, initial encounter: Secondary | ICD-10-CM

## 2015-02-15 DIAGNOSIS — Z7982 Long term (current) use of aspirin: Secondary | ICD-10-CM | POA: Diagnosis not present

## 2015-02-15 DIAGNOSIS — T148XXA Other injury of unspecified body region, initial encounter: Secondary | ICD-10-CM

## 2015-02-15 DIAGNOSIS — T814XXA Infection following a procedure, initial encounter: Secondary | ICD-10-CM | POA: Insufficient documentation

## 2015-02-15 DIAGNOSIS — Z973 Presence of spectacles and contact lenses: Secondary | ICD-10-CM | POA: Insufficient documentation

## 2015-02-15 MED ORDER — SULFAMETHOXAZOLE-TRIMETHOPRIM 800-160 MG PO TABS
1.0000 | ORAL_TABLET | Freq: Once | ORAL | Status: AC
  Administered 2015-02-15: 1 via ORAL
  Filled 2015-02-15: qty 1

## 2015-02-15 MED ORDER — OXYCODONE-ACETAMINOPHEN 5-325 MG PO TABS
2.0000 | ORAL_TABLET | Freq: Once | ORAL | Status: AC
  Administered 2015-02-15: 2 via ORAL
  Filled 2015-02-15: qty 2

## 2015-02-15 MED ORDER — SULFAMETHOXAZOLE-TRIMETHOPRIM 800-160 MG PO TABS: 1.0000 | ORAL_TABLET | Freq: Two times a day (BID) | ORAL | Status: AC
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
## 2015-02-15 NOTE — ED Notes (Signed)
Security notified that patient was a visitor who fell

## 2015-02-15 NOTE — ED Notes (Addendum)
Pt in stating he was on his walker and fell off of it after hitting the metal strips in the hallway, pt was here visiting his brother, pt landed on his right stump, pt had BKA completed 5 weeks ago by MD Christena Flake, pt states the wound opened up and was sent here for further evaluation, dressing in place on arrival with no blood noted

## 2015-02-15 NOTE — Discharge Instructions (Signed)
Wound Infection A wound infection happens when a type of germ (bacteria) starts growing in the wound. In some cases, this can cause the wound to break open. If cared for properly, the infected wound will heal from the inside to the outside. Wound infections need treatment. CAUSES An infection is caused by bacteria growing in the wound.  SYMPTOMS   Increase in redness, swelling, or pain at the wound site.  Increase in drainage at the wound site.  Wound or bandage (dressing) starts to smell bad.  Fever.  Feeling tired or fatigued.  Pus draining from the wound. TREATMENT  Your health care provider will prescribe antibiotic medicine. The wound infection should improve within 24 to 48 hours. Any redness around the wound should stop spreading and the wound should be less painful.  HOME CARE INSTRUCTIONS   Only take over-the-counter or prescription medicines for pain, discomfort, or fever as directed by your health care provider.  Take your antibiotics as directed. Finish them even if you start to feel better.  Gently wash the area with mild soap and water 2 times a day, or as directed. Rinse off the soap. Pat the area dry with a clean towel. Do not rub the wound. This may cause bleeding.  Follow your health care provider's instructions for how often you need to change the dressing.  Apply ointment and a dressing to the wound as directed.  If the dressing sticks, moisten it with soapy water and gently remove it.  Change the bandage right away if it becomes wet, dirty, or develops a bad smell.  Take showers. Do not take tub baths, swim, or do anything that may soak the wound until it is healed.  Avoid exercises that make you sweat heavily.  Use anti-itch medicine as directed by your health care provider. The wound may itch when it is healing. Do not pick or scratch at the wound.  Follow up with your health care provider to get your wound rechecked as directed. SEEK MEDICAL CARE  IF:  You have an increase in swelling, pain, or redness around the wound.  You have an increase in the amount of pus coming from the wound.  There is a bad smell coming from the wound.  More of the wound breaks open.  You have a fever. MAKE SURE YOU:   Understand these instructions.  Will watch your condition.  Will get help right away if you are not doing well or get worse. Document Released: 05/26/2003 Document Revised: 09/01/2013 Document Reviewed: 12/31/2010 [facility] Patient Information 2015 [place], [place]. This information is not intended to replace advice given to you by your health care provider. Make sure you discuss any questions you have with your health care provider. Lumbosacral Strain Lumbosacral strain is a strain of any of the parts that make up your lumbosacral vertebrae. Your lumbosacral vertebrae are the bones that make up the lower third of your backbone. Your lumbosacral vertebrae are held together by muscles and tough, fibrous tissue (ligaments).  CAUSES  A sudden blow to your back can cause lumbosacral strain. Also, anything that causes an excessive stretch of the muscles in the low back can cause this strain. This is typically seen when people exert themselves strenuously, fall, lift heavy objects, bend, or crouch repeatedly. RISK FACTORS  Physically demanding work.  Participation in pushing or pulling sports or sports that require a sudden twist of the back (tennis, golf, baseball).  Weight lifting.  Excessive lower back curvature.  Forward-tilted pelvis.  Weak back or abdominal muscles or both.  Tight hamstrings. SIGNS AND SYMPTOMS  Lumbosacral strain may cause pain in the area of your injury or pain that moves (radiates) down your leg.  DIAGNOSIS Your health care provider can often diagnose lumbosacral strain through a physical exam. In some cases, you may need tests such as X-ray exams.  TREATMENT  Treatment for your lower back injury depends on  many factors that your clinician will have to evaluate. However, most treatment will include the use of anti-inflammatory medicines. HOME CARE INSTRUCTIONS   Avoid hard physical activities (tennis, racquetball, waterskiing) if you are not in proper physical condition for it. This may aggravate or create problems.  If you have a back problem, avoid sports requiring sudden body movements. Swimming and walking are generally safer activities.  Maintain good posture.  Maintain a healthy weight.  For acute conditions, you may put ice on the injured area.  Put ice in a plastic bag.  Place a towel between your skin and the bag.  Leave the ice on for 20 minutes, 2-3 times a day.  When the low back starts healing, stretching and strengthening exercises may be recommended. SEEK MEDICAL CARE IF:  Your back pain is getting worse.  You experience severe back pain not relieved with medicines. SEEK IMMEDIATE MEDICAL CARE IF:   You have numbness, tingling, weakness, or problems with the use of your arms or legs.  There is a change in bowel or bladder control.  You have increasing pain in any area of the body, including your belly (abdomen).  You notice shortness of breath, dizziness, or feel faint.  You feel sick to your stomach (nauseous), are throwing up (vomiting), or become sweaty.  You notice discoloration of your toes or legs, or your feet get very cold. MAKE SURE YOU:   Understand these instructions.  Will watch your condition.  Will get help right away if you are not doing well or get worse. Document Released: 06/06/2005 Document Revised: 09/01/2013 Document Reviewed: 04/15/2013 [facility] Patient Information 2015 [place], [place]. This information is not intended to replace advice given to you by your health care provider. Make sure you discuss any questions you have with your health care provider.

## 2015-02-15 NOTE — ED Provider Notes (Signed)
CSN: [id number]     Arrival date & time 02/15/15  1627 History   First MD Initiated Contact with Patient 02/15/15 1820     Chief Complaint  Patient presents with  . Fall  . Post-op Problem     (Consider location/radiation/quality/duration/timing/severity/associated sxs/prior Treatment) HPI The patient was using his knee scooter in the hospital as a visitor. He states the scooter got turned over because of some metal rails that were thresholds on the floor that caught its wheel. 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 He also states he caught himself with his arms, not allowing his head to hit the floor. In doing so however he reports it jerked his neck and his back. He does have chronic pain and surgical sites in his neck and back as well. There was no associated paresthesia or weakness of the extremities. He states he hit the floor hard with his amputation site and has a lot of pain in the area. He reports since falling the wound edges have opened up some.  Past Medical History  Diagnosis Date  . Carotid artery occlusion 11/10/10    LEFT CAROTID ENDARTERECTOMY  . Diabetes mellitus   . Hypertension   . Hyperlipidemia   . COPD (chronic obstructive pulmonary disease)   . DJD (degenerative joint disease)   . GERD (gastroesophageal reflux disease)   . Substernal chest pain   . 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 Slurred speech     AS PER WIFE IN D/C NOTE 11/10/10  . PAD (peripheral artery disease)     Distal aortogram June 2012. Atherectomy left popliteal artery July 2012.   . Barrett's esophagus   . Fatty liver   . Diverticulosis of colon (without mention of hemorrhage)   . Emphysema of lung   . Osteomyelitis   . Complication of anesthesia     BP WENT UP AT [facility] "  . Sleep apnea     has not used CPAP in e years - took self off  . H/O hiatal hernia   . Neuromuscular disorder     peripheral neuropathy  . Full dentures   .  Wears glasses    Past Surgical History  Procedure Laterality Date  . Laminectomy      X 3 LUMBAR AND X 2 CERVICAL SPINE OPERATIONS  . Carotid endarterectomy  11/10/10  . Hernia repair      LEFT INGUINAL AND UMBILICAL REPAIRS  . Anterior fusion cervical spine  02/06/06    C4-5, C5-6, C6-7; SURGEON DR. MAX COHEN  . Penile prosthesis implant  08/14/05    INFRAPUBIC INSERTION OF INFLATABLE PENILE PROSTHESIS; SURGEON DR. Amalia Hailey  . Laparoscopic cholecystectomy w/ cholangiography  11/09/04    SURGEON DR. Luella Cook  . Total knee arthroplasty  07/2002    RIGHT KNEE ; SURGEON  DR. [name] ALSO HAD ARTHROSCOPIC RIGHT KNEE IN  10/2001  . Shoulder arthroscopy    . Cholecystectomy    . Amputation  11/05/2011    Procedure: AMPUTATION RAY;  Surgeon: Wylene Simmer, MD;  Location: [place];  Service: Orthopedics;  Laterality: Right;  Amputation of Right 4&5th Toes  . Femoral artery stent      x6  . Amputation Left 11/26/2012    Procedure: AMPUTATION RAY;  Surgeon: Wylene Simmer, MD;  Location: [place];  Service: Orthopedics;  Laterality: Left;  fourth ray amputation  . Cardiac catheterization  10/31/04    2009  . Carpal tunnel release Right 10/21/2013    Procedure: RIGHT CARPAL TUNNEL RELEASE;  Surgeon: Wynonia Sours, MD;  Location: [place];  Service: Orthopedics;  Laterality: Right;  . Ulnar nerve transposition Right 10/21/2013    Procedure: RIGHT ELBOW  ULNAR NERVE DECOMPRESSION;  Surgeon: Wynonia Sours, MD;  Location: [place];  Service: Orthopedics;  Laterality: Right;  . Joint replacement Right 2001    Total  . Spine surgery    . Neck surgery    . Esophageal manometry Bilateral 07/19/2014    Procedure: ESOPHAGEAL MANOMETRY (EM);  Surgeon: Jerene Bears, MD;  Location: [facility];  Service: Gastroenterology;  Laterality: Bilateral;  . Lower extremity angiogram N/A 03/19/2012    Procedure: LOWER EXTREMITY ANGIOGRAM;  Surgeon: Burnell Blanks, MD;  Location: [facility] CATH LAB;   Service: Cardiovascular;  Laterality: N/A;  . Amputation Right 08/27/2014    Procedure: Transmetatarsal Amputation;  Surgeon: Newt Minion, MD;  Location: [place];  Service: Orthopedics;  Laterality: Right;  . Left heart catheterization with coronary angiogram N/A 10/29/2014    Procedure: LEFT HEART CATHETERIZATION WITH CORONARY ANGIOGRAM;  Surgeon: Laverda Page, MD;  Location: [facility] CATH LAB;  Service: Cardiovascular;  Laterality: N/A;  . Percutaneous coronary stent intervention (pci-s) Right 10/29/2014    Procedure: PERCUTANEOUS CORONARY STENT INTERVENTION (PCI-S);  Surgeon: Laverda Page, MD;  Location: [facility] CATH LAB;  Service: Cardiovascular;  Laterality: Right;  . Amputation Right 01/14/2015    Procedure: AMPUTATION BELOW KNEE;  Surgeon: Newt Minion, MD;  Location: [place];  Service: Orthopedics;  Laterality: Right;   Family History  Problem Relation Age of Onset  . Heart disease Father     Before age 18-  CAD, BPG  . Diabetes Father     Amputation  . Cancer Father     PROSTATE  . Hyperlipidemia Father   . Hypertension Father   . Heart attack Father     Triple BPG  . Arthritis      GRANDMOTHER  . Hypertension      OTHER FAMILY MEMBERS  . Colon cancer Brother   . Cancer Brother     Colon  . Diabetes Brother   . Heart disease Brother 55    A-Fib. Before age 21  . Hyperlipidemia Brother   . Hypertension Brother   . Cancer Sister     Breast   History  Substance Use Topics  . Smoking status: Current Some Day Smoker -- 0.50 packs/day for 35 years    Types: E-cigarettes  . Smokeless tobacco: Never Used     Comment: VAPOR CIGARETTES  . Alcohol Use: No     Comment: ONCE IN A WHILE    Review of Systems 10 Systems reviewed and are negative for acute change except as noted in the HPI.    Allergies  Ivp dye and Adhesive  Home Medications   Prior to Admission medications   Medication Sig Start Date End Date Taking? Authorizing Provider  ALPRAZolam Duanne Moron) 0.5 MG  tablet Take 0.5 tablets (0.25 mg total) by mouth every morning. 01/17/15   Shanker Kristeen Mans, MD  aspirin EC 81 MG tablet Take 81 mg by mouth daily.    Historical Provider, MD  atorvastatin (LIPITOR) 40 MG tablet Take 40 mg by mouth daily.    Historical Provider, MD  AZOR 5-20 MG per tablet Take 1 tablet by mouth at bedtime.  07/29/14   Historical  Provider, MD  carvedilol (COREG) 12.5 MG tablet Take 3 tablets (37.5 mg total) by mouth every morning. 01/20/15   Florencia Reasons, MD  carvedilol (COREG) 6.25 MG tablet Take 6.25 mg by mouth at bedtime.    Historical Provider, MD  clobetasol (TEMOVATE) 0.05 % external solution Apply 1 application topically 2 (two) times daily as needed. 01/20/15   Florencia Reasons, MD  clopidogrel (PLAVIX) 75 MG tablet Take 75 mg by mouth daily. 1 tab daily 03/28/12   Historical Provider, MD  clotrimazole-betamethasone (LOTRISONE) cream Apply 1 application topically 2 (two) times daily. 01/03/15   Historical Provider, MD  ezetimibe (ZETIA) 10 MG tablet Take 10 mg by mouth every morning.    Historical Provider, MD  fentaNYL (DURAGESIC - DOSED MCG/HR) 50 MCG/HR Place 1 patch (50 mcg total) onto the skin every 3 (three) days. 01/20/15   Florencia Reasons, MD  furosemide (LASIX) 80 MG tablet Take 0.5 tablets (40 mg total) by mouth daily. 01/20/15   Florencia Reasons, MD  glyBURIDE-metformin (GLUCOVANCE) 2.5-500 MG per tablet Take 2 tablets by mouth 2 (two) times daily.  03/05/12   Historical Provider, MD  hydrALAZINE (APRESOLINE) 25 MG tablet Take 1 tablet (25 mg total) by mouth 3 (three) times daily. 10/30/14   Adrian Prows, MD  HYDROmorphone (DILAUDID) 2 MG tablet Take 1 tablet (2 mg total) by mouth every 4 (four) hours as needed. 01/17/15   Shanker Kristeen Mans, MD  insulin glargine (LANTUS) 100 UNIT/ML injection Inject 50 Units into the skin at bedtime.     Historical Provider, MD  insulin glulisine (APIDRA) 100 UNIT/ML injection Inject 10-15 Units into the skin 3 (three) times daily before meals. 10 breakfast 10 lunch and 15  supper    Historical Provider, MD  isosorbide mononitrate (IMDUR) 120 MG 24 hr tablet Take 120 mg by mouth daily.    Historical Provider, MD  Linaclotide Rolan Lipa) 145 MCG CAPS capsule Take 1 capsule (145 mcg total) by mouth daily as needed. 01/20/15   Florencia Reasons, MD  meloxicam (MOBIC) 15 MG tablet Take 15 mg by mouth daily.    Historical Provider, MD  Multiple Vitamin (MULTIVITAMIN) tablet Take 1 tablet by mouth daily.    Historical Provider, MD  nitroGLYCERIN (NITROSTAT) 0.4 MG SL tablet Place 1 tablet (0.4 mg total) under the tongue every 5 (five) minutes as needed (as needed for esophageal spasm). 08/09/14   Jerene Bears, MD  omega-3 acid ethyl esters (LOVAZA) 1 G capsule Take 2,000 mg by mouth 2 (two) times daily.     Historical Provider, MD  ondansetron (ZOFRAN-ODT) 4 MG disintegrating tablet Take 4 mg by mouth 4 (four) times daily as needed for nausea.    Historical Provider, MD  oxyCODONE-acetaminophen (PERCOCET/ROXICET) 5-325 MG per tablet Take 1 tablet by mouth every 8 (eight) hours as needed. Patient taking differently: Take 1 tablet by mouth every 8 (eight) hours.  09/30/14   Thurnell Lose, MD  pantoprazole (PROTONIX) 40 MG tablet Take 1 tablet (40 mg total) by mouth daily before breakfast. 10/30/14   Adrian Prows, MD  Polyethyl Glycol-Propyl Glycol (SYSTANE ULTRA OP) Place 1 drop into both eyes as needed (for dry eyes).    Historical Provider, MD  pregabalin (LYRICA) 100 MG capsule Take 100 mg by mouth 3 (three) times daily.    Historical Provider, MD  rOPINIRole (REQUIP) 2 MG tablet Take 2 mg by mouth at bedtime.     Historical Provider, MD  sulfamethoxazole-trimethoprim (BACTRIM DS,SEPTRA DS) 800-160 MG per  tablet Take 1 tablet by mouth 2 (two) times daily. 02/15/15 02/22/15  Charlesetta Shanks, MD  Tamsulosin HCl (FLOMAX) 0.4 MG CAPS Take 0.4 mg by mouth daily.    Historical Provider, MD   BP 142/62 mmHg  Pulse 63  Temp(Src) 98.3 F (36.8 C) (Oral)  Resp 16  Ht 6\' 1"  (1.854 m)  Wt 257 lb  (116.574 kg)  BMI 33.91 kg/m2  SpO2 99% Physical Exam  Constitutional: He is oriented to person, place, and time. He appears well-developed and well-nourished.  HENT:  Head: Normocephalic and atraumatic.  Eyes: EOM are normal. Pupils are equal, round, and reactive to light.  Neck: Neck supple.   endorses mild cervical spine tenderness to palpation.  Cardiovascular: Normal rate, regular rhythm, normal heart sounds and intact distal pulses.   Pulmonary/Chest: Effort normal and breath sounds normal.  Abdominal: Soft. Bowel sounds are normal. He exhibits no distension. There is no tenderness.  Musculoskeletal: Normal range of motion. He exhibits no edema.  Patient endorses discomfort palpation in the lumbar spine. Examination of the back does not show any contusions or abrasions. See the photo documentation of the amputation site.  Neurological: He is alert and oriented to person, place, and time. He has normal strength. Coordination normal. GCS eye subscore is 4. GCS verbal subscore is 5. GCS motor subscore is 6.  Skin: Skin is warm, dry and intact.  Psychiatric: He has a normal mood and affect.          ED Course  Procedures (including critical care time) Labs Review Labs Reviewed - No data to display  Imaging Review Dg Cervical Spine 2-3 Views  02/15/2015   CLINICAL DATA:  59 year old male with cervical spine and lower back pain after falling earlier today  EXAM: CERVICAL SPINE - 2-3 VIEW  COMPARISON:  Prior MRI of the cervical spine 10/03/2013  FINDINGS: Limited visualization of the cervical spine laterally secondary to overlap from the shoulder girdles. 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 No evidence of hardware fracture. No definite fracture identified within the visualized portions of the cervical spine. Visualized lung apices are within normal limits.  IMPRESSION:  No evidence of acute fracture or malalignment within the visualized cervical spine (well seen to the C5-C6 level).  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   Electronically Signed   By: Jacqulynn Cadet M.D.   On: 02/15/2015 20:34   Dg Lumbar Spine 2-3 Views  02/15/2015   CLINICAL DATA:  59 year old male with lumbar spine pain after falling earlier today  EXAM: LUMBAR SPINE - 2-3 VIEW  COMPARISON:  Prior MRI of the lumbar spine 10/03/2013  FINDINGS: No evidence of acute fracture or malalignment. Vertebral body heights are maintained. There is multilevel degenerative disc disease and endplate spurring. Surgical changes of prior L3-L5 posterior lumbar interbody fusion with interbody grafts at L4-L5 and L5-S1. No evidence of change in alignment or hardware complication. The visualized bowel gas pattern is unremarkable. No lytic or blastic osseous lesion.  IMPRESSION: Negative.   Electronically Signed   By: Jacqulynn Cadet M.D.   On: 02/15/2015 20:36   Dg Knee Complete 4 Views Right  02/15/2015   CLINICAL DATA:  Recent BKA.  Fall with suspected wound dehiscence.  EXAM: RIGHT KNEE - COMPLETE 4+ VIEW  COMPARISON:  None.  FINDINGS: RIGHT total knee arthroplasty. Advanced patellar degenerative change. Below-knee amputation with tibial and fibular  stumps. There is a small osseous fragment displaced from the posterior and medial aspect of the fibula which could be acute or postsurgical. There is moderate soft tissue swelling inferior to the amputated segments of bone.  IMPRESSION: Marked soft tissue swelling of the stump.  Small osseous fragment displaced from the fibula which could be acute or postsurgical. See discussion above.   Electronically Signed   By: Rolla Flatten M.D.   On: 02/15/2015 20:38     EKG Interpretation None      MDM   Final diagnoses:  Fall, initial encounter  Contusion of soft tissue  Postoperative wound infection, initial encounter  Lumbar strain,  initial encounter  Cervical strain, initial encounter   Patient presents with chief complaint of a fall. X-rays do not show any acute fractures. Patient felt that his wound had opened up and become erythematous due to his fall on it. Examining the wound however I felt the erythema at the margins and the small areas of dehiscence with a bit of yellow exudate is suggestive of a cellulitis. As the findings are suggestive of cellulitis I have initiated Bactrim for the patient. He may develop some ecchymosis however at this point the condition of the wound does not appear to have acute complications due to a fall.    Charlesetta Shanks, MD 02/16/15 [phone]

## 2015-02-21 ENCOUNTER — Ambulatory Visit: Payer: BLUE CROSS/BLUE SHIELD | Admitting: Infectious Diseases

## 2015-02-22 ENCOUNTER — Encounter: Payer: Self-pay | Admitting: Family

## 2015-02-25 ENCOUNTER — Encounter: Payer: Self-pay | Admitting: Family

## 2015-02-25 ENCOUNTER — Ambulatory Visit (HOSPITAL_COMMUNITY)
Admission: RE | Admit: 2015-02-25 | Discharge: 2015-02-25 | Disposition: A | Discharge status: Home / Self Care | Payer: BLUE CROSS/BLUE SHIELD | Source: Ambulatory Visit | Attending: Family | Admitting: Family

## 2015-02-25 ENCOUNTER — Ambulatory Visit (INDEPENDENT_AMBULATORY_CARE_PROVIDER_SITE_OTHER): Payer: BLUE CROSS/BLUE SHIELD | Admitting: Family

## 2015-02-25 VITALS — BP 119/63 | HR 63 | Resp 16 | Ht 73.5 in | Wt 250.0 lb

## 2015-02-25 DIAGNOSIS — Z9889 Other specified postprocedural states: Secondary | ICD-10-CM | POA: Diagnosis not present

## 2015-02-25 DIAGNOSIS — I6521 Occlusion and stenosis of right carotid artery: Secondary | ICD-10-CM | POA: Diagnosis not present

## 2015-02-25 DIAGNOSIS — Z48812 Encounter for surgical aftercare following surgery on the circulatory system: Secondary | ICD-10-CM

## 2015-02-25 DIAGNOSIS — Z87891 Personal history of nicotine dependence: Secondary | ICD-10-CM

## 2015-02-25 DIAGNOSIS — I6523 Occlusion and stenosis of bilateral carotid arteries: Secondary | ICD-10-CM

## 2015-02-25 NOTE — Patient Instructions (Signed)
Stroke Prevention Some medical conditions and behaviors are associated with an increased chance of having a stroke. You may prevent a stroke by making healthy choices and managing medical conditions. HOW CAN I REDUCE MY RISK OF HAVING A STROKE?   Stay physically active. Get at least 30 minutes of activity on most or all days.  Do not smoke. It may also be helpful to avoid exposure to secondhand smoke.  Limit alcohol use. Moderate alcohol use is considered to be:  No more than 2 drinks per day for men.  No more than 1 drink per day for nonpregnant women.  Eat healthy foods. This involves:  Eating 5 or more servings of fruits and vegetables a day.  Making dietary changes that address high blood pressure (hypertension), high cholesterol, diabetes, or obesity.  Manage your cholesterol levels.  Making food choices that are high in fiber and low in saturated fat, trans fat, and cholesterol may control cholesterol levels.  Take any prescribed medicines to control cholesterol as directed by your health care provider.  Manage your diabetes.  Controlling your carbohydrate and sugar intake is recommended to manage diabetes.  Take any prescribed medicines to control diabetes as directed by your health care provider.  Control your hypertension.  Making food choices that are low in salt (sodium), saturated fat, trans fat, and cholesterol is recommended to manage hypertension.  Take any prescribed medicines to control hypertension as directed by your health care provider.  Maintain a healthy weight.  Reducing calorie intake and making food choices that are low in sodium, saturated fat, trans fat, and cholesterol are recommended to manage weight.  Stop drug abuse.  Avoid taking birth control pills.  Talk to your health care provider about the risks of taking birth control pills if you are over 35 years old, smoke, get migraines, or have ever had a blood clot.  Get evaluated for sleep  disorders (sleep apnea).  Talk to your health care provider about getting a sleep evaluation if you snore a lot or have excessive sleepiness.  Take medicines only as directed by your health care provider.  For some people, aspirin or blood thinners (anticoagulants) are helpful in reducing the risk of forming abnormal blood clots that can lead to stroke. If you have the irregular heart rhythm of atrial fibrillation, you should be on a blood thinner unless there is a good reason you cannot take them.  Understand all your medicine instructions.  Make sure that other conditions (such as anemia or atherosclerosis) are addressed. SEEK IMMEDIATE MEDICAL CARE IF:   You have sudden weakness or numbness of the face, arm, or leg, especially on one side of the body.  Your face or eyelid droops to one side.  You have sudden confusion.  You have trouble speaking (aphasia) or understanding.  You have sudden trouble seeing in one or both eyes.  You have sudden trouble walking.  You have dizziness.  You have a loss of balance or coordination.  You have a sudden, severe headache with no known cause.  You have new chest pain or an irregular heartbeat. Any of these symptoms may represent a serious problem that is an emergency. Do not wait to see if the symptoms will go away. Get medical help at once. Call your local emergency services (911 in [place] Do not drive yourself to the hospital. Document Released: 10/04/2004 Document Revised: 01/11/2014 Document Reviewed: 02/27/2013 [employer] Patient Information 2015 [employer]. This information is not intended to replace advice given   to you by your health care provider. Make sure you discuss any questions you have with your health care provider.  

## 2015-02-25 NOTE — Progress Notes (Signed)
[facility] [place] HISTORY AND PHYSICAL   MRN : [id number]  History of Present Illness:   [name] is a 59 y.o. male patient of Dr. Kellie Simmering who is s/p left CEA on 11/10/2010, he returns today for follow up. Pt denies any history of stroke or TIA.  He also has severe PAD. 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; states that since then his DM improved.  He has 6 stents in his LE's.  ESI's did not help his back issues, is considering nerve stimulator in his back; legs giving out, this apparently is attributed to his back issues. He also has DM peripheral neuropathy.  Pt states Dr.George Adams [phone]) at [facility] told pt that he now has good circulation in his legs after he had 2 stents placed in each lower leg in 2013, after his July, 2013 visit with Dr. Kellie Simmering, at [facility].  Dr. Andree Elk, Vascular surgeon in the [place] area, is following pt's PAD.  [name] is his new cardiologist as Dr. Rollene Fare is no longer available to pt. He had 3 cardiac stents placed early 2016 by [name].  Pt Diabetic: Yes, states in good control with last A1C at 6.4 Pt smoker: former smoker, quit February, 2013  He takes Plavix, ASA, and a statin.  Current Outpatient Prescriptions  Medication Sig Dispense Refill  . ALPRAZolam (XANAX) 0.5 MG tablet Take 0.5 tablets (0.25 mg total) by mouth every morning. (Patient taking differently: Take 1 mg by mouth every morning. Take one tablet  Every morning) 10 tablet 0  . aspirin EC 81 MG tablet Take 81 mg by mouth daily.    Marland Kitchen atorvastatin (LIPITOR) 40 MG tablet Take 40 mg by mouth daily.    . AZOR 5-20 MG per tablet Take 1 tablet by mouth at bedtime.   0  . carvedilol (COREG) 25 MG tablet every morning.  0  . carvedilol (COREG) 6.25 MG tablet Take 6.25 mg by mouth at bedtime.    . clobetasol (TEMOVATE) 0.05 % external solution Apply 1 application topically 2 (two) times daily as needed. 50 mL 0   . clopidogrel (PLAVIX) 75 MG tablet Take 75 mg by mouth daily. 1 tab daily    . clotrimazole-betamethasone (LOTRISONE) cream Apply 1 application topically 2 (two) times daily.  1  . ezetimibe (ZETIA) 10 MG tablet Take 10 mg by mouth every morning.    . fentaNYL (DURAGESIC - DOSED MCG/HR) 50 MCG/HR Place 1 patch (50 mcg total) onto the skin every 3 (three) days. (Patient taking differently: Place 50 mcg onto the skin every 3 (three) days. Change patch every three days) 3 patch 0  . furosemide (LASIX) 80 MG tablet Take 0.5 tablets (40 mg total) by mouth daily. (Patient taking differently: Take 80 mg by mouth daily. ) 30 tablet 0  . glyBURIDE-metformin (GLUCOVANCE) 2.5-500 MG per tablet Take 2 tablets by mouth 2 (two) times daily.     . hydrALAZINE (APRESOLINE) 25 MG tablet Take 1 tablet (25 mg total) by mouth 3 (three) times daily. 90 tablet 1  . HYDROmorphone (DILAUDID) 2 MG tablet Take 1 tablet (2 mg total) by mouth every 4 (four) hours as needed. 20 tablet 0  . insulin glargine (LANTUS) 100 UNIT/ML injection Inject 40 Units into the skin at bedtime.     . insulin glulisine (APIDRA) 100 UNIT/ML injection Inject into the skin 3 (three) times daily before meals. 10 breakfast  10 lunch and 10  supper    . isosorbide mononitrate (IMDUR) 120 MG 24 hr tablet Take 120 mg by mouth daily.    . Linaclotide (LINZESS) 145 MCG CAPS capsule Take 1 capsule (145 mcg total) by mouth daily as needed. 30 capsule 2  . meloxicam (MOBIC) 15 MG tablet Take 15 mg by mouth daily.    . Multiple Vitamin (MULTIVITAMIN) tablet Take 1 tablet by mouth daily.    . nitroGLYCERIN (NITROSTAT) 0.4 MG SL tablet Place 1 tablet (0.4 mg total) under the tongue every 5 (five) minutes as needed (as needed for esophageal spasm). 30 tablet 1  . omega-3 acid ethyl esters (LOVAZA) 1 G capsule Take 2,000 mg by mouth 2 (two) times daily.     . ondansetron (ZOFRAN-ODT) 4 MG disintegrating tablet Take 4 mg by mouth 4 (four) times daily as needed  for nausea.    Marland Kitchen oxyCODONE-acetaminophen (PERCOCET/ROXICET) 5-325 MG per tablet Take 1 tablet by mouth every 8 (eight) hours as needed. (Patient taking differently: Take 1 tablet by mouth every 8 (eight) hours. ) 30 tablet 0  . pantoprazole (PROTONIX) 40 MG tablet Take 1 tablet (40 mg total) by mouth daily before breakfast. 30 tablet 3  . Polyethyl Glycol-Propyl Glycol (SYSTANE ULTRA OP) Place 1 drop into both eyes as needed (for dry eyes).    . pregabalin (LYRICA) 100 MG capsule Take 100 mg by mouth 3 (three) times daily.    Marland Kitchen rOPINIRole (REQUIP) 2 MG tablet Take 2 mg by mouth at bedtime.     . Tamsulosin HCl (FLOMAX) 0.4 MG CAPS Take 0.4 mg by mouth daily.    . carvedilol (COREG) 12.5 MG tablet Take 3 tablets (37.5 mg total) by mouth every morning. (Patient not taking: Reported on 02/25/2015) 90 tablet 3   No current facility-administered medications for this visit.    Past Medical History  Diagnosis Date  . Carotid artery occlusion 11/10/10    LEFT CAROTID ENDARTERECTOMY  . Diabetes mellitus   . Hypertension   . Hyperlipidemia   . COPD (chronic obstructive pulmonary disease)   . DJD (degenerative joint disease)   . GERD (gastroesophageal reflux disease)   . Substernal chest pain   . 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 Slurred speech     AS PER WIFE IN D/C NOTE 11/10/10  . PAD (peripheral artery disease)     Distal aortogram June 2012. Atherectomy left popliteal artery July 2012.   . Barrett's esophagus   . Fatty liver   . Diverticulosis of colon (without mention of hemorrhage)   . Emphysema of lung   . Osteomyelitis   . Complication of anesthesia     BP WENT UP AT [facility] "  . Sleep apnea     has not used CPAP in e years - took self off  . H/O hiatal hernia   . Neuromuscular disorder     peripheral neuropathy  . Full dentures   . Wears glasses   . CHF (congestive heart failure)     Social History History  Substance Use Topics  . Smoking status:  Former Smoker -- 0.50 packs/day for 35 years    Types: E-cigarettes    Quit date: 10/28/2011  . Smokeless tobacco: Never Used     Comment: VAPOR CIGARETTES  . Alcohol Use: No     Comment: ONCE IN A WHILE    Family History Family History  Problem Relation Age of  Onset  . Heart disease Father     Before age 9-  CAD, BPG  . Diabetes Father     Amputation  . Cancer Father     PROSTATE  . Hyperlipidemia Father   . Hypertension Father   . Heart attack Father     Triple BPG  . Varicose Veins Father   . Arthritis      GRANDMOTHER  . Hypertension      OTHER FAMILY MEMBERS  . Colon cancer Brother   . Cancer Brother     Colon  . Diabetes Brother   . Heart disease Brother 55    A-Fib. Before age 52  . Hyperlipidemia Brother   . Hypertension Brother   . Cancer Sister     Breast  . Hyperlipidemia Sister   . Hypertension Sister   . Hypertension Son     Surgical History Past Surgical History  Procedure Laterality Date  . Laminectomy      X 3 LUMBAR AND X 2 CERVICAL SPINE OPERATIONS  . Carotid endarterectomy  11/10/10  . Hernia repair      LEFT INGUINAL AND UMBILICAL REPAIRS  . Anterior fusion cervical spine  02/06/06    C4-5, C5-6, C6-7; SURGEON DR. MAX COHEN  . Penile prosthesis implant  08/14/05    INFRAPUBIC INSERTION OF INFLATABLE PENILE PROSTHESIS; SURGEON DR. Amalia Hailey  . Laparoscopic cholecystectomy w/ cholangiography  11/09/04    SURGEON DR. Luella Cook  . Total knee arthroplasty  07/2002    RIGHT KNEE ; SURGEON  DR. [name] ALSO HAD ARTHROSCOPIC RIGHT KNEE IN  10/2001  . Shoulder arthroscopy    . Cholecystectomy    . Amputation  11/05/2011    Procedure: AMPUTATION RAY;  Surgeon: Wylene Simmer, MD;  Location: [place];  Service: Orthopedics;  Laterality: Right;  Amputation of Right 4&5th Toes  . Femoral artery stent      x6  . Amputation Left 11/26/2012    Procedure: AMPUTATION RAY;  Surgeon: Wylene Simmer, MD;  Location: [place];  Service: Orthopedics;  Laterality: Left;   fourth ray amputation  . Cardiac catheterization  10/31/04    2009  . Carpal tunnel release Right 10/21/2013    Procedure: RIGHT CARPAL TUNNEL RELEASE;  Surgeon: Wynonia Sours, MD;  Location: [place];  Service: Orthopedics;  Laterality: Right;  . Ulnar nerve transposition Right 10/21/2013    Procedure: RIGHT ELBOW  ULNAR NERVE DECOMPRESSION;  Surgeon: Wynonia Sours, MD;  Location: [place];  Service: Orthopedics;  Laterality: Right;  . Joint replacement Right 2001    Total  . Spine surgery    . Neck surgery    . Esophageal manometry Bilateral 07/19/2014    Procedure: ESOPHAGEAL MANOMETRY (EM);  Surgeon: Jerene Bears, MD;  Location: [facility];  Service: Gastroenterology;  Laterality: Bilateral;  . Lower extremity angiogram N/A 03/19/2012    Procedure: LOWER EXTREMITY ANGIOGRAM;  Surgeon: Burnell Blanks, MD;  Location: [facility] CATH LAB;  Service: Cardiovascular;  Laterality: N/A;  . Amputation Right 08/27/2014    Procedure: Transmetatarsal Amputation;  Surgeon: Newt Minion, MD;  Location: [place];  Service: Orthopedics;  Laterality: Right;  . Left heart catheterization with coronary angiogram N/A 10/29/2014    Procedure: LEFT HEART CATHETERIZATION WITH CORONARY ANGIOGRAM;  Surgeon: Laverda Page, MD;  Location: [facility] CATH LAB;  Service: Cardiovascular;  Laterality: N/A;  . Percutaneous coronary stent intervention (pci-s) Right 10/29/2014    Procedure: PERCUTANEOUS CORONARY STENT  INTERVENTION (PCI-S);  Surgeon: Laverda Page, MD;  Location: [facility] CATH LAB;  Service: Cardiovascular;  Laterality: Right;  . Amputation Right 01/14/2015    Procedure: AMPUTATION BELOW KNEE;  Surgeon: Newt Minion, MD;  Location: [place];  Service: Orthopedics;  Laterality: Right;    Allergies  Allergen Reactions  . Ivp Dye [Iodinated Diagnostic Agents] Anaphylaxis    Breathing problems  . Adhesive [Tape] Rash    Current Outpatient Prescriptions  Medication Sig Dispense Refill   . ALPRAZolam (XANAX) 0.5 MG tablet Take 0.5 tablets (0.25 mg total) by mouth every morning. (Patient taking differently: Take 1 mg by mouth every morning. Take one tablet  Every morning) 10 tablet 0  . aspirin EC 81 MG tablet Take 81 mg by mouth daily.    Marland Kitchen atorvastatin (LIPITOR) 40 MG tablet Take 40 mg by mouth daily.    . AZOR 5-20 MG per tablet Take 1 tablet by mouth at bedtime.   0  . carvedilol (COREG) 25 MG tablet every morning.  0  . carvedilol (COREG) 6.25 MG tablet Take 6.25 mg by mouth at bedtime.    . clobetasol (TEMOVATE) 0.05 % external solution Apply 1 application topically 2 (two) times daily as needed. 50 mL 0  . clopidogrel (PLAVIX) 75 MG tablet Take 75 mg by mouth daily. 1 tab daily    . clotrimazole-betamethasone (LOTRISONE) cream Apply 1 application topically 2 (two) times daily.  1  . ezetimibe (ZETIA) 10 MG tablet Take 10 mg by mouth every morning.    . fentaNYL (DURAGESIC - DOSED MCG/HR) 50 MCG/HR Place 1 patch (50 mcg total) onto the skin every 3 (three) days. (Patient taking differently: Place 50 mcg onto the skin every 3 (three) days. Change patch every three days) 3 patch 0  . furosemide (LASIX) 80 MG tablet Take 0.5 tablets (40 mg total) by mouth daily. (Patient taking differently: Take 80 mg by mouth daily. ) 30 tablet 0  . glyBURIDE-metformin (GLUCOVANCE) 2.5-500 MG per tablet Take 2 tablets by mouth 2 (two) times daily.     . hydrALAZINE (APRESOLINE) 25 MG tablet Take 1 tablet (25 mg total) by mouth 3 (three) times daily. 90 tablet 1  . HYDROmorphone (DILAUDID) 2 MG tablet Take 1 tablet (2 mg total) by mouth every 4 (four) hours as needed. 20 tablet 0  . insulin glargine (LANTUS) 100 UNIT/ML injection Inject 40 Units into the skin at bedtime.     . insulin glulisine (APIDRA) 100 UNIT/ML injection Inject into the skin 3 (three) times daily before meals. 10 breakfast 10 lunch and 10  supper    . isosorbide mononitrate (IMDUR) 120 MG 24 hr tablet Take 120 mg by mouth  daily.    . Linaclotide (LINZESS) 145 MCG CAPS capsule Take 1 capsule (145 mcg total) by mouth daily as needed. 30 capsule 2  . meloxicam (MOBIC) 15 MG tablet Take 15 mg by mouth daily.    . Multiple Vitamin (MULTIVITAMIN) tablet Take 1 tablet by mouth daily.    . nitroGLYCERIN (NITROSTAT) 0.4 MG SL tablet Place 1 tablet (0.4 mg total) under the tongue every 5 (five) minutes as needed (as needed for esophageal spasm). 30 tablet 1  . omega-3 acid ethyl esters (LOVAZA) 1 G capsule Take 2,000 mg by mouth 2 (two) times daily.     . ondansetron (ZOFRAN-ODT) 4 MG disintegrating tablet Take 4 mg by mouth 4 (four) times daily as needed for nausea.    Marland Kitchen oxyCODONE-acetaminophen (PERCOCET/ROXICET) 5-325  MG per tablet Take 1 tablet by mouth every 8 (eight) hours as needed. (Patient taking differently: Take 1 tablet by mouth every 8 (eight) hours. ) 30 tablet 0  . pantoprazole (PROTONIX) 40 MG tablet Take 1 tablet (40 mg total) by mouth daily before breakfast. 30 tablet 3  . Polyethyl Glycol-Propyl Glycol (SYSTANE ULTRA OP) Place 1 drop into both eyes as needed (for dry eyes).    . pregabalin (LYRICA) 100 MG capsule Take 100 mg by mouth 3 (three) times daily.    Marland Kitchen rOPINIRole (REQUIP) 2 MG tablet Take 2 mg by mouth at bedtime.     . Tamsulosin HCl (FLOMAX) 0.4 MG CAPS Take 0.4 mg by mouth daily.    . carvedilol (COREG) 12.5 MG tablet Take 3 tablets (37.5 mg total) by mouth every morning. (Patient not taking: Reported on 02/25/2015) 90 tablet 3   No current facility-administered medications for this visit.     REVIEW OF SYSTEMS: See HPI for pertinent positives and negatives.  Physical Examination Filed Vitals:   02/25/15 1521 02/25/15 1524  BP: 112/58 119/63  Pulse: 62 63  Resp:  16  Height:  6' 1.5" (1.867 m)  Weight:  250 lb (113.399 kg)  SpO2:  99%   Body mass index is 32.53 kg/(m^2).  General: WDWN obese male in NAD Gait: Is using a rolling scooter to rest his right BKA stump in order to be  ambulatory. HENT: WNL Eyes: Pupils equal Pulmonary: normal non-labored breathing , without Rales, rhonchi, wheezing Cardiac: RRR, no murmur detected  Abdomen: soft, NT, no palpable masses Skin: no rashes, no ulcers; no Gangrene , no cellulitis.  VASCULAR EXAM  Carotid Bruits Left Right   positive Negative  radial pulses: 2+ palpable and =   VASCULAR EXAM: Extremities without ischemic changes  without Gangrene; no open wounds. Right BKA.      LE Pulses LEFT RIGHT   FEMORAL  palpable  palpable    POPLITEAL not palpable  not palpable   POSTERIOR TIBIAL not palpable  BKA    DORSALIS PEDIS   not palpable  BKA     Musculoskeletal: no muscle wasting or atrophy; 1+ left  pretibial pitting edema. Right BKA. Neurologic: A&O X 3; Appropriate Affect ;  MOTOR FUNCTION: 5/5 Symmetric, CN 2-12 intact Speech is fluent/normal           Non-Invasive Vascular Imaging (02/25/2015): CEREBROVASCULAR DUPLEX EVALUATION    INDICATION: Follow-up carotid disease     PREVIOUS INTERVENTION(S): Left carotid endarterectomy 11/10/2010    DUPLEX EXAM:     RIGHT  LEFT  Peak Systolic Velocities (cm/s) End Diastolic Velocities (cm/s) Plaque LOCATION Peak Systolic Velocities (cm/s) End Diastolic Velocities (cm/s) Plaque  190 30  CCA PROXIMAL 116 18   114 19  CCA MID 137 24 HM  110 22 HT CCA DISTAL 117 25   227 24 HT ECA 187 13   82 23 HT ICA PROXIMAL 70 22 HM  87 27  ICA MID 98 32   113 35  ICA DISTAL 85 28     .75 ICA / CCA Ratio (PSV) NA  Antegrade  Vertebral Flow Antegrade   [id number] Brachial Systolic Pressure (mmHg) 123456  Within normal limits  Brachial Artery Waveforms Within normal limits     Plaque Morphology:  HM = Homogeneous, HT = Heterogeneous, CP = Calcific Plaque, SP =  Smooth Plaque, IP = Irregular Plaque  ADDITIONAL FINDINGS:     IMPRESSION: 1. Evidence of <40% stenosis of  the right internal carotid artery. 2. Patent left carotid endarterectomy with mild (<40%) hyperplasia at the distal patch site. 3. Bilateral vertebral artery is antegrade.    Compared to the previous exam:  No significant change compared to prior exam.       ASSESSMENT:  [name] is a 59 y.o. male who is s/p left CEA on 11/10/2010. A vascular surgeon in [place] that placed some arterial stents in his legs is following pt's PAD. Pt has no history of stroke or TIA. Today's carotid Duplex suggests <40% stenosis of the right internal carotid artery and patent left carotid endarterectomy with mild (<40%) hyperplasia at the distal patch site. No significant change compared to prior exam a year ago.   PLAN:   Based on today's exam and non-invasive vascular lab results, the patient will follow up in 1 year with carotid Duplex.  I discussed in depth with the patient the nature of atherosclerosis, and emphasized the importance of maximal medical management including strict control of blood pressure, blood glucose, and lipid levels, obtaining regular exercise, and cessation of smoking.  The patient is aware that without maximal medical management the underlying atherosclerotic disease process will progress, limiting the benefit of any interventions. . The patient was given information about stroke prevention and what symptoms should prompt the patient to seek immediate medical care.  Thank you for allowing [place] to participate in this patient's care.  Clemon Chambers, RN, MSN, FNP-C [facility] Office: [phone]  Clinic MD: Trula Slade  02/25/2015 3:50 PM

## 2015-03-22 ENCOUNTER — Encounter: Payer: Self-pay | Admitting: *Deleted

## 2015-03-23 ENCOUNTER — Ambulatory Visit (INDEPENDENT_AMBULATORY_CARE_PROVIDER_SITE_OTHER): Payer: BLUE CROSS/BLUE SHIELD | Admitting: Infectious Diseases

## 2015-03-23 ENCOUNTER — Encounter: Payer: Self-pay | Admitting: Infectious Diseases

## 2015-03-23 VITALS — BP 184/79 | HR 74 | Temp 97.3°F | Wt 252.0 lb

## 2015-03-23 DIAGNOSIS — L97519 Non-pressure chronic ulcer of other part of right foot with unspecified severity: Secondary | ICD-10-CM

## 2015-03-23 DIAGNOSIS — I6523 Occlusion and stenosis of bilateral carotid arteries: Secondary | ICD-10-CM

## 2015-03-23 DIAGNOSIS — T798XXD Other early complications of trauma, subsequent encounter: Secondary | ICD-10-CM

## 2015-03-23 DIAGNOSIS — E11621 Type 2 diabetes mellitus with foot ulcer: Secondary | ICD-10-CM | POA: Diagnosis not present

## 2015-03-23 NOTE — Assessment & Plan Note (Signed)
Will f/u with PCP, appears to be well controlled.

## 2015-03-23 NOTE — Progress Notes (Signed)
   Subjective:    Patient ID: [name], male    DOB: March 30, 1956, 59 y.o.   MRN: [id number]  HPI 59 yo M with hx PAD status post bilateral lower extremity stenting 2 years ago at [facility], left carotid endarterectomy in 2012, type 2 diabetes mellitus, hypertension, dyslipidemia, COPD, GERD/Barrett's esophagus, who underwent right fourth and fifth toe amputation a few years ago by [name]. In [facility] 2015, he had exposed bone with a small ulcer in the right foot and underwent transmetatarsal resection by [name], he went home and was doing well. He had sutures removed in early January then 09-25-14 he developed pain and drainage at the postop site. In the ER he was diagnosed with dehiscence of his postop wound with some puslike drainage. He was admitted on 09-26-14. He was evaluated by orthopedics and was treated with vanco/cefepime/flagyl. He did not undergo surgery. He was discussed with ID over the phone and was recommended to go home on po keflex for 2 weeks. He had a wound Cx which grew MSSA. He had plain films which did not show osteo. He was d/c home on 09-30-14. Finished keflex end of March 2016.   He had cardiac cath 2-19 and had 3 drug eluting stents in his RCA.  He has noticed new lesion on the dorsum of his L foot and also on his lateral L foot.  He also has a wound between his 1st and 2nd toes.  He was adm to hospital in May 2016 with sepsis, he underwent R BKA 01-20-15. He has a residual wound on his stump due to a fall. He had some wound dehisc at time of fall. Had f/u xray- Marked soft tissue swelling of the stump.Small osseous fragment displaced from the fibula which could be acute or postsurgical.  He was given rx for keflex post fall, still taking. Has had f/u with Dr [name], should get prosthetic next week.   FSG have been 126-140.  Has been watching his diet.   Review of Systems  Constitutional: Negative for fever and chills.  Gastrointestinal: Negative for diarrhea.  no  wounds on L foot.      Objective:   Physical Exam  Constitutional: He appears well-developed and well-nourished.  Musculoskeletal:       Legs:         Assessment & Plan:

## 2015-03-23 NOTE — Assessment & Plan Note (Signed)
He is doing well, appears to be healing well.  Would stop his kelfex when his current rx runs out.  He can f/u prn.

## 2015-04-25 ENCOUNTER — Encounter: Payer: Self-pay | Admitting: Cardiovascular Disease

## 2015-05-12 ENCOUNTER — Encounter: Payer: Self-pay | Admitting: Physical Therapy

## 2015-05-12 ENCOUNTER — Ambulatory Visit: Payer: BLUE CROSS/BLUE SHIELD | Attending: Emergency Medicine | Admitting: Physical Therapy

## 2015-05-12 DIAGNOSIS — Z5189 Encounter for other specified aftercare: Secondary | ICD-10-CM | POA: Insufficient documentation

## 2015-05-12 DIAGNOSIS — R269 Unspecified abnormalities of gait and mobility: Secondary | ICD-10-CM | POA: Diagnosis present

## 2015-05-12 DIAGNOSIS — R6889 Other general symptoms and signs: Secondary | ICD-10-CM | POA: Diagnosis not present

## 2015-05-12 DIAGNOSIS — R52 Pain, unspecified: Secondary | ICD-10-CM | POA: Diagnosis present

## 2015-05-12 DIAGNOSIS — R2681 Unsteadiness on feet: Secondary | ICD-10-CM

## 2015-05-12 DIAGNOSIS — R29818 Other symptoms and signs involving the nervous system: Secondary | ICD-10-CM | POA: Insufficient documentation

## 2015-05-12 DIAGNOSIS — Z89511 Acquired absence of right leg below knee: Secondary | ICD-10-CM

## 2015-05-12 DIAGNOSIS — Z4789 Encounter for other orthopedic aftercare: Secondary | ICD-10-CM

## 2015-05-12 DIAGNOSIS — R531 Weakness: Secondary | ICD-10-CM | POA: Diagnosis present

## 2015-05-12 DIAGNOSIS — R2689 Other abnormalities of gait and mobility: Secondary | ICD-10-CM

## 2015-05-13 NOTE — Therapy (Signed)
[facility] [address] [place], [place], [address] Phone: [phone]   Fax:  [phone]  Physical Therapy Evaluation  Patient Details  Name: [name] MRN: [id number] Date of Birth: [birth year] Referring Provider:  Anda Kraft, MD  Encounter Date: 05/12/2015      PT End of Session - 05/12/15 1315    Visit Number 1   Number of Visits 17   Date for PT Re-Evaluation 07/08/15   Authorization Type Medicare 2nd G-code every 10th visit   PT Start Time 1315   PT Stop Time 1408   PT Time Calculation (min) 53 min   Equipment Utilized During Treatment Gait belt   Activity Tolerance Patient tolerated treatment well   Behavior During Therapy [facility] for tasks assessed/performed      Past Medical History  Diagnosis Date  . Carotid artery occlusion 11/10/10    LEFT CAROTID ENDARTERECTOMY  . Diabetes mellitus   . Hypertension   . Hyperlipidemia   . COPD (chronic obstructive pulmonary disease)   . DJD (degenerative joint disease)   . GERD (gastroesophageal reflux disease)   . Substernal chest pain   . 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 Slurred speech     AS PER WIFE IN D/C NOTE 11/10/10  . PAD (peripheral artery disease)     Distal aortogram June 2012. Atherectomy left popliteal artery July 2012.   . Barrett's esophagus   . Fatty liver   . Diverticulosis of colon (without mention of hemorrhage)   . Emphysema of lung   . Osteomyelitis   . Complication of anesthesia     BP WENT UP AT [facility] "  . Sleep apnea     has not used CPAP in e years - took self off  . H/O hiatal hernia   . Neuromuscular disorder     peripheral neuropathy  . Full dentures   . Wears glasses   . CHF (congestive heart failure)   . History of echocardiogram 08/22/2011    Mildly hypertrophic left ventricle with normal systolic function and mild diastolic dysfunction. mildly dilated left atrium. no significant valvular  abnormalities. no pericardial effusion no change from previous study 10/2010. the ejection fraction =>55%  . History of cardiovascular stress test 03/23/2011    ejection fraction measured 51%, with an end-diastolic volume of 123456 ml and an end-systolic volume of 81 ml. no evidence of myocardial ischemia.  . H/O Doppler ultrasound 06/28/2012    Lower venous duplex left-- findings consistent with acute deep vein thrombosis involving the personal vein of the left lower extremity. No evidence of Baker's cyst on the left. Duplex scan of the left lower extremity arteries reveal adequate blood flow.   . History of Holter monitoring 02/14/2012    Scan quality is fair due to baseline artifact. Predominant rhythm is Sinus. Veentricular Ectopy was noted in single beat form. The total recording time was 24:00 hours. total beats =[id number]. the total time analyzed was 23:58 hours. Heart  Max = 102 at 1:18:22, Min= 52 at 9:56:19 am., Avg= 70. There were 0 pause(s) > 2.5 seconds. the longest pause = 0.00 sec. at 1:51 pm(1)  . OSA (obstructive sleep apnea) 12/26/08    AHI of 6.3/hr overall, 32.7/hr during REM sleep and an RDI of 24.8/hr and 50.9/hr.    Past Surgical History  Procedure Laterality Date  . Laminectomy      X 3 LUMBAR AND X 2  CERVICAL SPINE OPERATIONS  . Carotid endarterectomy  11/10/10  . Hernia repair      LEFT INGUINAL AND UMBILICAL REPAIRS  . Anterior fusion cervical spine  02/06/06    C4-5, C5-6, C6-7; SURGEON DR. MAX COHEN  . Penile prosthesis implant  08/14/05    INFRAPUBIC INSERTION OF INFLATABLE PENILE PROSTHESIS; SURGEON DR. Amalia Hailey  . Laparoscopic cholecystectomy w/ cholangiography  11/09/04    SURGEON DR. Luella Cook  . Total knee arthroplasty  07/2002    RIGHT KNEE ; SURGEON  DR. [name] ALSO HAD ARTHROSCOPIC RIGHT KNEE IN  10/2001  . Shoulder arthroscopy    . Cholecystectomy    . Amputation  11/05/2011    Procedure: AMPUTATION RAY;  Surgeon: Wylene Simmer, MD;  Location: [place];  Service:  Orthopedics;  Laterality: Right;  Amputation of Right 4&5th Toes  . Femoral artery stent      x6  . Amputation Left 11/26/2012    Procedure: AMPUTATION RAY;  Surgeon: Wylene Simmer, MD;  Location: [place];  Service: Orthopedics;  Laterality: Left;  fourth ray amputation  . Cardiac catheterization  10/31/04    2009  . Carpal tunnel release Right 10/21/2013    Procedure: RIGHT CARPAL TUNNEL RELEASE;  Surgeon: Wynonia Sours, MD;  Location: [place];  Service: Orthopedics;  Laterality: Right;  . Ulnar nerve transposition Right 10/21/2013    Procedure: RIGHT ELBOW  ULNAR NERVE DECOMPRESSION;  Surgeon: Wynonia Sours, MD;  Location: [place];  Service: Orthopedics;  Laterality: Right;  . Joint replacement Right 2001    Total  . Spine surgery    . Neck surgery    . Esophageal manometry Bilateral 07/19/2014    Procedure: ESOPHAGEAL MANOMETRY (EM);  Surgeon: Jerene Bears, MD;  Location: [facility];  Service: Gastroenterology;  Laterality: Bilateral;  . Lower extremity angiogram N/A 03/19/2012    Procedure: LOWER EXTREMITY ANGIOGRAM;  Surgeon: Burnell Blanks, MD;  Location: [facility] CATH LAB;  Service: Cardiovascular;  Laterality: N/A;  . Amputation Right 08/27/2014    Procedure: Transmetatarsal Amputation;  Surgeon: Newt Minion, MD;  Location: [place];  Service: Orthopedics;  Laterality: Right;  . Left heart catheterization with coronary angiogram N/A 10/29/2014    Procedure: LEFT HEART CATHETERIZATION WITH CORONARY ANGIOGRAM;  Surgeon: Laverda Page, MD;  Location: [facility] CATH LAB;  Service: Cardiovascular;  Laterality: N/A;  . Percutaneous coronary stent intervention (pci-s) Right 10/29/2014    Procedure: PERCUTANEOUS CORONARY STENT INTERVENTION (PCI-S);  Surgeon: Laverda Page, MD;  Location: [facility] CATH LAB;  Service: Cardiovascular;  Laterality: Right;  . Amputation Right 01/14/2015    Procedure: AMPUTATION BELOW KNEE;  Surgeon: Newt Minion, MD;  Location: [place];   Service: Orthopedics;  Laterality: Right;  . Carotid endarterectomy Left 11/10/2010    Subtotal occlusion of left internal carotid artery with left hemispheric transient ischemic attacks.    There were no vitals filed for this visit.  Visit Diagnosis:  Decreased functional activity tolerance  Abnormality of gait  Unsteadiness  Balance problems  Status post below knee amputation of right lower extremity  Encounter for prosthetic gait training  Functional weakness  Pain, generalized      Subjective Assessment - 05/12/15 1315    Subjective He underwent a right Transtibial Amputation 01/14/2015 due to non-healing Transmetarsal Amputation. He recieved his first prosthesis 05/03/2015 & is dependent in use / care. He presents for PT evaluation.   Patient Stated Goals To use prosthesis to fish,  work in Brewing technologist, work on cars   Currently in Pain? Yes   Pain Score 4   in last week, worst 7/10, best 4/10   Pain Location Other (Comment)  feet, lower legs, lateral hands - neuropathy   Pain Descriptors / Indicators Burning   Pain Type Neuropathic pain   Pain Onset More than a month ago   Pain Frequency Constant   Aggravating Factors  forgetting medications, weather   Pain Relieving Factors meds   Multiple Pain Sites Yes   Pain Score 4  in last week, worst 10/10, best 1/10   Pain Location Neck   Pain Orientation Mid   Pain Descriptors / Indicators Throbbing;Aching   Pain Type Chronic pain   Pain Onset More than a month ago   Pain Frequency Constant   Aggravating Factors  laying down wrong, hold head down too long   Pain Relieving Factors medication, proper posture with support   Pain Score 2  in last week, worst 5/10, best 01/0   Pain Location Back   Pain Orientation Mid;Lower   Pain Descriptors / Indicators Aching;Burning   Pain Type Chronic pain   Pain Onset More than a month ago   Pain Frequency Constant   Aggravating Factors  moving wrong   Pain Relieving Factors  medication            [facility] PT Assessment - 05/12/15 1315    Assessment   Medical Diagnosis Right Transtibial Amputation   Onset Date/Surgical Date 05/03/15   Precautions   Precautions Fall   Restrictions   Weight Bearing Restrictions No   Balance Screen   Has the patient fallen in the past 6 months Yes   How many times? 1  tripped over metal piece at hospital   Has the patient had a decrease in activity level because of a fear of falling?  No   Is the patient reluctant to leave their home because of a fear of falling?  No   Home Social worker Private residence   Living Arrangements Spouse/significant other;Children  41yo son   Type of [place] entrance  deck boards to ramp   Home Layout One level   [place] - 2 wheels;Crutches;Wheelchair - manual  knee scooter   Prior Function   Level of Independence Independent;Independent with household mobility without device;Independent with community mobility without device;Independent with gait;Independent with transfers   Vocation On disability   Leisure yard work, car work   Observation/Other Assessments   Focus on Therapeutic Outcomes (FOTO)  23  Functional Status   Fear Avoidance Belief Questionnaire (FABQ)  63 (16)   ROM / Strength   AROM / PROM / Strength AROM;Strength   AROM   Overall AROM  Within functional limits for tasks performed   Strength   Overall Strength Within functional limits for tasks performed   Transfers   Transfers Sit to Stand;Stand to Sit   Sit to Stand 5: Supervision;Without upper extremity assist;From chair/3-in-1  supervision to stabalize   Stand to Sit 5: Supervision;Without upper extremity assist;To chair/3-in-1  uncontrolled descent last 5"   Ambulation/Gait   Ambulation/Gait Yes   Ambulation/Gait Assistance 4: Min assist   Ambulation Distance (Feet) 100 Feet   Assistive device Prosthesis;Straight cane   Gait Pattern Step-through  pattern;Decreased step length - left;Decreased stance time - right;Decreased stride length;Decreased hip/knee flexion - right;Trunk flexed;Abducted- right;Poor foot clearance - right   Ambulation Surface Indoor;Level  Gait velocity 1.55 ft/sec  <1.8 ft/sec indicates fall risk esp. with assist needed   Standardized Balance Assessment   Standardized Balance Assessment Berg Balance Test   Berg Balance Test   Sit to Stand Needs minimal aid to stand or to stabilize   Standing Unsupported Able to stand 2 minutes with supervision   Sitting with Back Unsupported but Feet Supported on Floor or Stool Able to sit safely and securely 2 minutes   Stand to Sit Controls descent by using hands   Transfers Able to transfer safely, minor use of hands   Standing Unsupported with Eyes Closed Able to stand 10 seconds with supervision   Standing Ubsupported with Feet Together Able to place feet together independently but unable to hold for 30 seconds   From Standing, Reach Forward with Outstretched Arm Can reach forward >5 cm safely (2")   From Standing Position, Pick up Object from Floor Able to pick up shoe, needs supervision   From Standing Position, Turn to Look Behind Over each Shoulder Turn sideways only but maintains balance   Turn 360 Degrees Needs close supervision or verbal cueing   Standing Unsupported, Alternately Place Feet on Step/Stool Able to complete >2 steps/needs minimal assist   Standing Unsupported, One Foot in Front Needs help to step but can hold 15 seconds   Standing on One Leg Tries to lift leg/unable to hold 3 seconds but remains standing independently   Total Score 31         Prosthetics Assessment - 05/12/15 1315    Prosthetics   Prosthetic Care Dependent with Skin check;Residual limb care;Prosthetic cleaning;Ply sock cleaning;Correct ply sock adjustment;Proper wear schedule/adjustment;Proper weight-bearing schedule/adjustment   Donning prosthesis  Supervision   Doffing  prosthesis  Supervision   Current prosthetic wear tolerance (days/week)  7 of 9 days since delivery   Current prosthetic wear tolerance (#hours/day)  initially 65min/day progressed to 2hrs 1x/day since yesterday   Current prosthetic weight-bearing tolerance (hours/day)  tolerated 8 min with partial wt on prosthesis with no c/o pain   Edema non-pitting noted today but reports fluctuates    Residual limb condition  71mm wound at distal limb, no signs of infection   K code/activity level with prosthetic use  3                  [facility] Adult PT Treatment/Exercise - 05/12/15 1315    Prosthetics   Education Provided Skin check;Residual limb care;Prosthetic cleaning;Correct ply sock adjustment;Proper Donning;Proper Doffing;Proper wear schedule/adjustment;Proper weight-bearing schedule/adjustment;Other (comment)  use of Tegaderm for wound, wear 2hrs 2x/day increase weekly   Person(s) Educated Patient   Education Method Explanation;Demonstration;Tactile cues;Verbal cues   Education Method Verbalized understanding;Tactile cues required;Verbal cues required;Needs further instruction                  PT Short Term Goals - 05/12/15 1315    PT SHORT TERM GOAL #1   Title Patient verbalizes proper prosthetic care with minimal cues for problem solving. (Target Date: 06/10/2015)   Time 4   Period Weeks   Status New   PT SHORT TERM GOAL #2   Title Patient tolerates >12 hrs/day without negative change in skin issues and no tenderness to limb.  (Target Date: 06/10/2015)   Time 4   Period Weeks   Status New   PT SHORT TERM GOAL #3   Title Patient ambulates 250' with single point cane & prosthesis with cues for gait deviations only no physical assist needed.  (Target  Date: 06/10/2015)   Time 4   Period Weeks   Status New   PT SHORT TERM GOAL #4   Title Patient negotiates ramps, curbs & stairs (1 rail) with single point cane & prosthesis with supervision.  (Target Date: 06/10/2015)   Time 4    Period Weeks   Status New   PT SHORT TERM GOAL #5   Title Berg Balance >45/56  (Target Date: 06/10/2015)   Time 4   Period Weeks   Status New           PT Long Term Goals - 05/12/15 1315    PT LONG TERM GOAL #1   Title Patient verbalizes and demonstrates proper prosthetic care to enable safe use of prosthesis.  (Target Date: 07/08/2015)   Time 8   Period Weeks   Status New   PT LONG TERM GOAL #2   Title Patient tolerates wear of prosthesis >90% of awake hours with no skin issues or tenderness to enable functional use throughout his day.  (Target Date: 07/08/2015)   Time 8   Period Weeks   Status New   PT LONG TERM GOAL #3   Title Patient reports chronic pain similar to prior level with increase in pain </= 2 increments with standing & gait activities.  (Target Date: 07/08/2015)   Time 8   Period Weeks   Status New   PT LONG TERM GOAL #4   Title Patient self-reports >20% increase in functional status using FOTO.  (Target Date: 07/08/2015)   Time 8   Period Weeks   Status New   PT LONG TERM GOAL #5   Title Patient ambulates >500' including grass, ramps, curbs & stairs (1 rail) with single point cane or less and prosthesis modified independent.  (Target Date: 07/08/2015)   Time 8   Period Weeks   Status New   Additional Long Term Goals   Additional Long Term Goals Yes   PT LONG TERM GOAL #6   Title Patient ambulates 100' with prosthesis only carrying 15# objects around furniture indpendently.  (Target Date: 07/08/2015)   Time 8   Period Weeks   Status New               Plan - 05/12/15 1315    Clinical Impression Statement This 59yo male has chronic history of back, neck and LE orthopedic issues but was functioning at full community level. He developed wound on right foot with partial foot amputation 08/27/2014 and Right Transtibial Amputation 01/24/15. He has limited mobility during this time causing decondtioning. He recieved his first prosthesi on 05/03/2015  and is dependent in care /use. He has limited wear which limits function throughout his day. His Berg Balance of 31/56 and Gait velocity of 1.48ft/sec both indicate fall risk with standing & gait activities. He is dependent in prosthetic gait.    Pt will benefit from skilled therapeutic intervention in order to improve on the following deficits Abnormal gait;Decreased activity tolerance;Decreased balance;Decreased endurance;Decreased mobility;Decreased range of motion;Decreased knowledge of use of DME;Decreased strength;Postural dysfunction;Prosthetic Dependency   Rehab Potential Good   PT Frequency 2x / week   PT Duration 8 weeks   PT Treatment/Interventions ADLs/Self Care Home Management;DME Instruction;Gait training;Stair training;Functional mobility training;Therapeutic activities;Therapeutic exercise;Balance training;Neuromuscular re-education;Patient/family education;Prosthetic Training   PT Next Visit Plan review prosthetic care, gait with crutches including barriers   PT Home Exercise Plan midline at sink   Consulted and Agree with Plan of Care Patient  G-Codes - 05/12/15 1315    Functional Assessment Tool Used dependent in prosthetic care, tolerates wear up to 2hrs. wound on limb 57mm with no signs of infection.    Functional Limitation Self care   Self Care Current Status [phone]) At least 80 percent but less than 100 percent impaired, limited or restricted   Self Care Goal Status [id number]) At least 1 percent but less than 20 percent impaired, limited or restricted       Problem List Patient Active Problem List   Diagnosis Date Noted  . Sepsis 01/14/2015  . Opacity of lung on imaging study 01/14/2015  . Angina pectoris associated with type 2 diabetes mellitus 10/28/2014  . Wound infection 09/26/2014  . Diabetes mellitus type 2, controlled 08/25/2014  . Essential hypertension 08/25/2014  . Hyperlipidemia 08/25/2014  . 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 Foot ulcer, left 11/24/2012  . Chronic total occlusion of artery of the extremities 04/08/2012  . Cellulitis 03/19/2012  . PAD (peripheral artery disease) 02/26/2012  . Dizziness 02/26/2012  . Onychomycosis 02/01/2012  . Diabetes mellitus 11/01/2011  . Occlusion and stenosis of carotid artery without mention of cerebral infarction 06/12/2011  . Aftercare following surgery of the circulatory system, [place] 06/12/2011  . GERD (gastroesophageal reflux disease) 05/08/2011  . Barrett's esophagus without dysplasia 05/08/2011  . PVD (peripheral vascular disease) 02/01/2011  . HTN (hypertension) 02/01/2011  . Tobacco abuse 02/01/2011    Jamey Reas PT, DPT 05/13/2015, 1:21 PM  [facility] [address] [place], [place], [address] Phone: [phone]   Fax:  [phone]

## 2015-05-18 ENCOUNTER — Ambulatory Visit: Payer: BLUE CROSS/BLUE SHIELD | Admitting: Rehabilitation

## 2015-05-18 ENCOUNTER — Encounter: Payer: Self-pay | Admitting: Rehabilitation

## 2015-05-18 DIAGNOSIS — R2681 Unsteadiness on feet: Secondary | ICD-10-CM

## 2015-05-18 DIAGNOSIS — Z89511 Acquired absence of right leg below knee: Secondary | ICD-10-CM

## 2015-05-18 DIAGNOSIS — R269 Unspecified abnormalities of gait and mobility: Secondary | ICD-10-CM

## 2015-05-18 DIAGNOSIS — R6889 Other general symptoms and signs: Secondary | ICD-10-CM | POA: Diagnosis not present

## 2015-05-18 NOTE — Therapy (Signed)
[facility] [address] [place], [place], [address] Phone: [phone]   Fax:  [phone]  Physical Therapy Treatment  Patient Details  Name: [name] MRN: [id number] Date of Birth: [birth year] Referring Provider:  Anda Kraft, MD  Encounter Date: 05/18/2015      PT End of Session - 05/18/15 1259    Visit Number 2   Number of Visits 17   Date for PT Re-Evaluation 07/08/15   Authorization Type Medicare 2nd G-code every 10th visit   PT Start Time 1125   PT Stop Time 1209   PT Time Calculation (min) 44 min   Equipment Utilized During Treatment Gait belt   Activity Tolerance Patient tolerated treatment well   Behavior During Therapy [facility] for tasks assessed/performed      Past Medical History  Diagnosis Date  . Carotid artery occlusion 11/10/10    LEFT CAROTID ENDARTERECTOMY  . Diabetes mellitus   . Hypertension   . Hyperlipidemia   . COPD (chronic obstructive pulmonary disease)   . DJD (degenerative joint disease)   . GERD (gastroesophageal reflux disease)   . Substernal chest pain   . 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 Slurred speech     AS PER WIFE IN D/C NOTE 11/10/10  . PAD (peripheral artery disease)     Distal aortogram June 2012. Atherectomy left popliteal artery July 2012.   . Barrett's esophagus   . Fatty liver   . Diverticulosis of colon (without mention of hemorrhage)   . Emphysema of lung   . Osteomyelitis   . Complication of anesthesia     BP WENT UP AT [facility] "  . Sleep apnea     has not used CPAP in e years - took self off  . H/O hiatal hernia   . Neuromuscular disorder     peripheral neuropathy  . Full dentures   . Wears glasses   . CHF (congestive heart failure)   . History of echocardiogram 08/22/2011    Mildly hypertrophic left ventricle with normal systolic function and mild diastolic dysfunction. mildly dilated left atrium. no significant valvular  abnormalities. no pericardial effusion no change from previous study 10/2010. the ejection fraction =>55%  . History of cardiovascular stress test 03/23/2011    ejection fraction measured 51%, with an end-diastolic volume of 123456 ml and an end-systolic volume of 81 ml. no evidence of myocardial ischemia.  . H/O Doppler ultrasound 06/28/2012    Lower venous duplex left-- findings consistent with acute deep vein thrombosis involving the personal vein of the left lower extremity. No evidence of Baker's cyst on the left. Duplex scan of the left lower extremity arteries reveal adequate blood flow.   . History of Holter monitoring 02/14/2012    Scan quality is fair due to baseline artifact. Predominant rhythm is Sinus. Veentricular Ectopy was noted in single beat form. The total recording time was 24:00 hours. total beats =[id number]. the total time analyzed was 23:58 hours. Heart  Max = 102 at 1:18:22, Min= 52 at 9:56:19 am., Avg= 70. There were 0 pause(s) > 2.5 seconds. the longest pause = 0.00 sec. at 1:51 pm(1)  . OSA (obstructive sleep apnea) 12/26/08    AHI of 6.3/hr overall, 32.7/hr during REM sleep and an RDI of 24.8/hr and 50.9/hr.    Past Surgical History  Procedure Laterality Date  . Laminectomy      X 3 LUMBAR AND X 2  CERVICAL SPINE OPERATIONS  . Carotid endarterectomy  11/10/10  . Hernia repair      LEFT INGUINAL AND UMBILICAL REPAIRS  . Anterior fusion cervical spine  02/06/06    C4-5, C5-6, C6-7; SURGEON DR. MAX COHEN  . Penile prosthesis implant  08/14/05    INFRAPUBIC INSERTION OF INFLATABLE PENILE PROSTHESIS; SURGEON DR. Amalia Hailey  . Laparoscopic cholecystectomy w/ cholangiography  11/09/04    SURGEON DR. Luella Cook  . Total knee arthroplasty  07/2002    RIGHT KNEE ; SURGEON  DR. [name] ALSO HAD ARTHROSCOPIC RIGHT KNEE IN  10/2001  . Shoulder arthroscopy    . Cholecystectomy    . Amputation  11/05/2011    Procedure: AMPUTATION RAY;  Surgeon: Wylene Simmer, MD;  Location: [place];  Service:  Orthopedics;  Laterality: Right;  Amputation of Right 4&5th Toes  . Femoral artery stent      x6  . Amputation Left 11/26/2012    Procedure: AMPUTATION RAY;  Surgeon: Wylene Simmer, MD;  Location: [facility];  Service: Orthopedics;  Laterality: Left;  fourth ray amputation  . Cardiac catheterization  10/31/04    2009  . Carpal tunnel release Right 10/21/2013    Procedure: RIGHT CARPAL TUNNEL RELEASE;  Surgeon: Wynonia Sours, MD;  Location: [place];  Service: Orthopedics;  Laterality: Right;  . Ulnar nerve transposition Right 10/21/2013    Procedure: RIGHT ELBOW  ULNAR NERVE DECOMPRESSION;  Surgeon: Wynonia Sours, MD;  Location: [place];  Service: Orthopedics;  Laterality: Right;  . Joint replacement Right 2001    Total  . Spine surgery    . Neck surgery    . Esophageal manometry Bilateral 07/19/2014    Procedure: ESOPHAGEAL MANOMETRY (EM);  Surgeon: Jerene Bears, MD;  Location: [facility];  Service: Gastroenterology;  Laterality: Bilateral;  . Lower extremity angiogram N/A 03/19/2012    Procedure: LOWER EXTREMITY ANGIOGRAM;  Surgeon: Burnell Blanks, MD;  Location: [facility] CATH LAB;  Service: Cardiovascular;  Laterality: N/A;  . Amputation Right 08/27/2014    Procedure: Transmetatarsal Amputation;  Surgeon: Newt Minion, MD;  Location: [place];  Service: Orthopedics;  Laterality: Right;  . Left heart catheterization with coronary angiogram N/A 10/29/2014    Procedure: LEFT HEART CATHETERIZATION WITH CORONARY ANGIOGRAM;  Surgeon: Laverda Page, MD;  Location: [facility] CATH LAB;  Service: Cardiovascular;  Laterality: N/A;  . Percutaneous coronary stent intervention (pci-s) Right 10/29/2014    Procedure: PERCUTANEOUS CORONARY STENT INTERVENTION (PCI-S);  Surgeon: Laverda Page, MD;  Location: [facility] CATH LAB;  Service: Cardiovascular;  Laterality: Right;  . Amputation Right 01/14/2015    Procedure: AMPUTATION BELOW KNEE;  Surgeon: Newt Minion, MD;  Location: [place];   Service: Orthopedics;  Laterality: Right;  . Carotid endarterectomy Left 11/10/2010    Subtotal occlusion of left internal carotid artery with left hemispheric transient ischemic attacks.    There were no vitals filed for this visit.  Visit Diagnosis:  Abnormality of gait  Unsteadiness  Status post below knee amputation of right lower extremity      Subjective Assessment - 05/18/15 1130    Subjective Reports wearing prosthesis 2 hrs 2x/day, however was not able to don prosthesis yesterday due to increased swelling.  Went to MD who changed "fluid pill" to remove excess fluid and swelling.     Limitations Standing;Walking;House hold activities   Patient Stated Goals To use prosthesis to fish, work in yard/ flowers, work on cars   Currently  in Pain? No/denies                         [facility] Adult PT Treatment/Exercise - 05/18/15 0001    Transfers   Transfers Sit to Stand;Stand to Sit   Sit to Stand From chair/3-in-1;6: Modified independent (Device/Increase time);With upper extremity assist;With armrests   Stand to Sit To chair/3-in-1;6: Modified independent (Device/Increase time);With upper extremity assist;With armrests   Ambulation/Gait   Ambulation/Gait Yes   Ambulation/Gait Assistance 4: Min assist;5: Supervision;4: Min guard   Ambulation Distance (Feet) 150 Feet  150' with crutches & 100' w/ SPC   Assistive device Prosthesis;Straight cane;Crutches   Gait Pattern Step-through pattern;Decreased step length - left;Decreased stance time - right;Decreased stride length;Decreased hip/knee flexion - right;Trunk flexed;Abducted- right;Poor foot clearance - right  R knee varum during stance    Ambulation Surface Level;Indoor   Gait velocity --  <1.8 ft/sec indicates fall risk esp. with assist needed   Stairs Yes   Stairs Assistance 5: Supervision;4: Min guard   Stairs Assistance Details (indicate cue type and reason) Performed stairs x 4 reps to simulate community and  friends/family entrances.  Performed two reps with R rail and crutch on L in step to fashion with cues for sequencing.  Performed last two reps with either R only then L only rail with cues for switching crutch and that prosthesis moves with crutch.     Stair Management Technique One rail Right;One rail Left;Step to pattern;Forwards;With crutches  single crutch   Number of Stairs 4  x 4 reps   Height of Stairs 6   Ramp 4: Min assist;5: Supervision  prosthesis & crutches   Ramp Details (indicate cue type and reason) Performed ramp x 3 reps with use of crutches to simulate community entry.  Provided demonstration and verbal cues throughout on sequencing and technique.    Curb 4: Min assist  prosthesis & crutches   Curb Details (indicate cue type and reason) Performed curb step with use of B axillary crutches x 3 reps with demonstration and verbal cues on sequencing, safety and technique.     Gait Comments Introduced use of B axillary crutches to gait training in order to achieve increased stride length.  Performed approx 150' total with use of crutches in 3 point gait pattern due to pressure concerns on R residual limb.  Provided verbal and visual cues for sequencing and technique but was able to ambulate at S to min/guard level.  Then performed gait with SPC to simulate home environment ambulating over surface transitions (carpet to/from concrete), through tight spaces and around obstacles.  Requires min A for safety with use of SPC with min cues for safe turning technique.     Exercises   Exercises Other Exercises   Other Exercises  Performed gait in figure 8 pattern around two obstacles to better simulate gait around obstacles in home/community.   Performed with SPC at min A level for safety with demonstration on short inside step and longer outside step as well as appropriate pelvic alignment into turn.     Prosthetics   Current prosthetic wear tolerance (days/week)  daily except 1 since last appt    Current prosthetic wear tolerance (#hours/day)  2 hrs 2x day    Current prosthetic weight-bearing tolerance (hours/day)  --   Edema Pt with increased swelling per pt report, was able to don prosthesis during session but was somewhat snug.     Residual limb condition  45mm wound at distal limb, no signs of infection   Education Provided Skin check;Residual limb care;Prosthetic cleaning;Correct ply sock adjustment;Proper wear schedule/adjustment;Proper weight-bearing schedule/adjustment;Other (comment);Proper Doffing;Proper Donning   Person(s) Educated Patient   Education Method Explanation;Demonstration;Tactile cues;Verbal cues   Education Method Verbalized understanding;Returned demonstration;Needs further instruction;Verbal cues required   Donning Prosthesis Supervision   Doffing Prosthesis Modified independent (device/increased time)                PT Education - 05/18/15 1258    Education provided Yes   Education Details See prosthetic section in treatments.  Educated on prosthetic and care and wear schedule.  Educated to use [facility] in house and crutches outside of house during wear times.    Person(s) Educated Patient   Methods Explanation;Demonstration;Verbal cues   Comprehension Verbalized understanding;Returned demonstration;Need further instruction;Verbal cues required          PT Short Term Goals - 05/12/15 1315    PT SHORT TERM GOAL #1   Title Patient verbalizes proper prosthetic care with minimal cues for problem solving. (Target Date: 06/10/2015)   Time 4   Period Weeks   Status New   PT SHORT TERM GOAL #2   Title Patient tolerates >12 hrs/day without negative change in skin issues and no tenderness to limb.  (Target Date: 06/10/2015)   Time 4   Period Weeks   Status New   PT SHORT TERM GOAL #3   Title Patient ambulates 250' with single point cane & prosthesis with cues for gait deviations only no physical assist needed.  (Target Date: 06/10/2015)   Time 4    Period Weeks   Status New   PT SHORT TERM GOAL #4   Title Patient negotiates ramps, curbs & stairs (1 rail) with single point cane & prosthesis with supervision.  (Target Date: 06/10/2015)   Time 4   Period Weeks   Status New   PT SHORT TERM GOAL #5   Title Berg Balance >45/56  (Target Date: 06/10/2015)   Time 4   Period Weeks   Status New           PT Long Term Goals - 05/12/15 1315    PT LONG TERM GOAL #1   Title Patient verbalizes and demonstrates proper prosthetic care to enable safe use of prosthesis.  (Target Date: 07/08/2015)   Time 8   Period Weeks   Status New   PT LONG TERM GOAL #2   Title Patient tolerates wear of prosthesis >90% of awake hours with no skin issues or tenderness to enable functional use throughout his day.  (Target Date: 07/08/2015)   Time 8   Period Weeks   Status New   PT LONG TERM GOAL #3   Title Patient reports chronic pain similar to prior level with increase in pain </= 2 increments with standing & gait activities.  (Target Date: 07/08/2015)   Time 8   Period Weeks   Status New   PT LONG TERM GOAL #4   Title Patient self-reports >20% increase in functional status using FOTO.  (Target Date: 07/08/2015)   Time 8   Period Weeks   Status New   PT LONG TERM GOAL #5   Title Patient ambulates >500' including grass, ramps, curbs & stairs (1 rail) with single point cane or less and prosthesis modified independent.  (Target Date: 07/08/2015)   Time 8   Period Weeks   Status New   Additional Long Term Goals   Additional Long Term Goals  Yes   PT LONG TERM GOAL #6   Title Patient ambulates 100' with prosthesis only carrying 15# objects around furniture indpendently.  (Target Date: 07/08/2015)   Time 8   Period Weeks   Status New               Plan - 05/18/15 1301    Clinical Impression Statement Skilled session focused on prosthetic care, residual limb care, and wear schedule due to healing wound on R residual limb.   Also introduced  axillary crutches during gait in order to improve stride length and gait pattern and performed stairs, curb and ramp in this manner.  Also addressed household gait with use of SPC.     Pt will benefit from skilled therapeutic intervention in order to improve on the following deficits Abnormal gait;Decreased activity tolerance;Decreased balance;Decreased endurance;Decreased mobility;Decreased range of motion;Decreased knowledge of use of DME;Decreased strength;Postural dysfunction;Prosthetic Dependency   Rehab Potential Good   PT Frequency 2x / week   PT Duration 8 weeks   PT Treatment/Interventions ADLs/Self Care Home Management;DME Instruction;Gait training;Stair training;Functional mobility training;Therapeutic activities;Therapeutic exercise;Balance training;Neuromuscular re-education;Patient/family education;Prosthetic Training   PT Next Visit Plan Review prosthetic care, wear schedule, assess wound, ambulation outside with crutches, review steps, curb and ramp again for safety, balance on foam/rocker board   Consulted and Agree with Plan of Care Patient        Problem List Patient Active Problem List   Diagnosis Date Noted  . Sepsis 01/14/2015  . Opacity of lung on imaging study 01/14/2015  . Angina pectoris associated with type 2 diabetes mellitus 10/28/2014  . Wound infection 09/26/2014  . Diabetes mellitus type 2, controlled 08/25/2014  . Essential hypertension 08/25/2014  . Hyperlipidemia 08/25/2014  . 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 Foot ulcer, left 11/24/2012  . Chronic total occlusion of artery of the extremities 04/08/2012  . Cellulitis 03/19/2012  . PAD (peripheral artery disease) 02/26/2012  . Dizziness 02/26/2012  . Onychomycosis 02/01/2012  . Diabetes mellitus 11/01/2011  . Occlusion and stenosis of carotid artery without mention of cerebral infarction 06/12/2011  . Aftercare following surgery of the circulatory system, [place]  06/12/2011  . GERD (gastroesophageal reflux disease) 05/08/2011  . Barrett's esophagus without dysplasia 05/08/2011  . PVD (peripheral vascular disease) 02/01/2011  . HTN (hypertension) 02/01/2011  . Tobacco abuse 02/01/2011    Cameron Sprang, PT, MPT [facility] [address] [place], [place], [address] Phone: [phone]   Fax:  [phone] 05/18/2015, 2:31 PM

## 2015-05-20 ENCOUNTER — Encounter: Payer: Self-pay | Admitting: Rehabilitation

## 2015-05-20 ENCOUNTER — Ambulatory Visit: Payer: BLUE CROSS/BLUE SHIELD | Admitting: Rehabilitation

## 2015-05-20 DIAGNOSIS — R2689 Other abnormalities of gait and mobility: Secondary | ICD-10-CM

## 2015-05-20 DIAGNOSIS — R2681 Unsteadiness on feet: Secondary | ICD-10-CM

## 2015-05-20 DIAGNOSIS — R269 Unspecified abnormalities of gait and mobility: Secondary | ICD-10-CM

## 2015-05-20 DIAGNOSIS — R6889 Other general symptoms and signs: Secondary | ICD-10-CM | POA: Diagnosis not present

## 2015-05-20 DIAGNOSIS — Z89511 Acquired absence of right leg below knee: Secondary | ICD-10-CM

## 2015-05-20 NOTE — Therapy (Signed)
[facility] [address] [place], [place], [address] Phone: [phone]   Fax:  [phone]  Physical Therapy Treatment  Patient Details  Name: [name] MRN: [id number] Date of Birth: [birth year] Referring Provider:  Anda Kraft, MD  Encounter Date: 05/20/2015      PT End of Session - 05/20/15 1642    Visit Number 3   Number of Visits 17   Date for PT Re-Evaluation 07/08/15   Authorization Type Medicare 2nd G-code every 10th visit   PT Start Time 1455  Pt late for appointment   PT Stop Time 1530   PT Time Calculation (min) 35 min   Equipment Utilized During Treatment Gait belt   Activity Tolerance Patient tolerated treatment well   Behavior During Therapy [facility] for tasks assessed/performed      Past Medical History  Diagnosis Date  . Carotid artery occlusion 11/10/10    LEFT CAROTID ENDARTERECTOMY  . Diabetes mellitus   . Hypertension   . Hyperlipidemia   . COPD (chronic obstructive pulmonary disease)   . DJD (degenerative joint disease)   . GERD (gastroesophageal reflux disease)   . Substernal chest pain   . 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 Slurred speech     AS PER WIFE IN D/C NOTE 11/10/10  . PAD (peripheral artery disease)     Distal aortogram June 2012. Atherectomy left popliteal artery July 2012.   . Barrett's esophagus   . Fatty liver   . Diverticulosis of colon (without mention of hemorrhage)   . Emphysema of lung   . Osteomyelitis   . Complication of anesthesia     BP WENT UP AT [facility] "  . Sleep apnea     has not used CPAP in e years - took self off  . H/O hiatal hernia   . Neuromuscular disorder     peripheral neuropathy  . Full dentures   . Wears glasses   . CHF (congestive heart failure)   . History of echocardiogram 08/22/2011    Mildly hypertrophic left ventricle with normal systolic function and mild diastolic dysfunction. mildly dilated left atrium. no  significant valvular abnormalities. no pericardial effusion no change from previous study 10/2010. the ejection fraction =>55%  . History of cardiovascular stress test 03/23/2011    ejection fraction measured 51%, with an end-diastolic volume of 123456 ml and an end-systolic volume of 81 ml. no evidence of myocardial ischemia.  . H/O Doppler ultrasound 06/28/2012    Lower venous duplex left-- findings consistent with acute deep vein thrombosis involving the personal vein of the left lower extremity. No evidence of Baker's cyst on the left. Duplex scan of the left lower extremity arteries reveal adequate blood flow.   . History of Holter monitoring 02/14/2012    Scan quality is fair due to baseline artifact. Predominant rhythm is Sinus. Veentricular Ectopy was noted in single beat form. The total recording time was 24:00 hours. total beats =[id number]. the total time analyzed was 23:58 hours. Heart  Max = 102 at 1:18:22, Min= 52 at 9:56:19 am., Avg= 70. There were 0 pause(s) > 2.5 seconds. the longest pause = 0.00 sec. at 1:51 pm(1)  . OSA (obstructive sleep apnea) 12/26/08    AHI of 6.3/hr overall, 32.7/hr during REM sleep and an RDI of 24.8/hr and 50.9/hr.    Past Surgical History  Procedure Laterality Date  . Laminectomy      X  3 LUMBAR AND X 2 CERVICAL SPINE OPERATIONS  . Carotid endarterectomy  11/10/10  . Hernia repair      LEFT INGUINAL AND UMBILICAL REPAIRS  . Anterior fusion cervical spine  02/06/06    C4-5, C5-6, C6-7; SURGEON DR. MAX COHEN  . Penile prosthesis implant  08/14/05    INFRAPUBIC INSERTION OF INFLATABLE PENILE PROSTHESIS; SURGEON DR. Amalia Hailey  . Laparoscopic cholecystectomy w/ cholangiography  11/09/04    SURGEON DR. Luella Cook  . Total knee arthroplasty  07/2002    RIGHT KNEE ; SURGEON  DR. [name] ALSO HAD ARTHROSCOPIC RIGHT KNEE IN  10/2001  . Shoulder arthroscopy    . Cholecystectomy    . Amputation  11/05/2011    Procedure: AMPUTATION RAY;  Surgeon: Wylene Simmer, MD;  Location: [facility];  Service: Orthopedics;  Laterality: Right;  Amputation of Right 4&5th Toes  . Femoral artery stent      x6  . Amputation Left 11/26/2012    Procedure: AMPUTATION RAY;  Surgeon: Wylene Simmer, MD;  Location: [facility];  Service: Orthopedics;  Laterality: Left;  fourth ray amputation  . Cardiac catheterization  10/31/04    2009  . Carpal tunnel release Right 10/21/2013    Procedure: RIGHT CARPAL TUNNEL RELEASE;  Surgeon: Wynonia Sours, MD;  Location: [place];  Service: Orthopedics;  Laterality: Right;  . Ulnar nerve transposition Right 10/21/2013    Procedure: RIGHT ELBOW  ULNAR NERVE DECOMPRESSION;  Surgeon: Wynonia Sours, MD;  Location: [place];  Service: Orthopedics;  Laterality: Right;  . Joint replacement Right 2001    Total  . Spine surgery    . Neck surgery    . Esophageal manometry Bilateral 07/19/2014    Procedure: ESOPHAGEAL MANOMETRY (EM);  Surgeon: Jerene Bears, MD;  Location: [facility];  Service: Gastroenterology;  Laterality: Bilateral;  . Lower extremity angiogram N/A 03/19/2012    Procedure: LOWER EXTREMITY ANGIOGRAM;  Surgeon: Burnell Blanks, MD;  Location: [facility] CATH LAB;  Service: Cardiovascular;  Laterality: N/A;  . Amputation Right 08/27/2014    Procedure: Transmetatarsal Amputation;  Surgeon: Newt Minion, MD;  Location: [place];  Service: Orthopedics;  Laterality: Right;  . Left heart catheterization with coronary angiogram N/A 10/29/2014    Procedure: LEFT HEART CATHETERIZATION WITH CORONARY ANGIOGRAM;  Surgeon: Laverda Page, MD;  Location: [facility] CATH LAB;  Service: Cardiovascular;  Laterality: N/A;  . Percutaneous coronary stent intervention (pci-s) Right 10/29/2014    Procedure: PERCUTANEOUS CORONARY STENT INTERVENTION (PCI-S);  Surgeon: Laverda Page, MD;  Location: [facility] CATH LAB;  Service: Cardiovascular;  Laterality: Right;  . Amputation Right 01/14/2015    Procedure: AMPUTATION BELOW KNEE;  Surgeon: Newt Minion, MD;  Location:  [place];  Service: Orthopedics;  Laterality: Right;  . Carotid endarterectomy Left 11/10/2010    Subtotal occlusion of left internal carotid artery with left hemispheric transient ischemic attacks.    There were no vitals filed for this visit.  Visit Diagnosis:  Abnormality of gait  Unsteadiness  Status post below knee amputation of right lower extremity  Balance problems      Subjective Assessment - 05/20/15 1506    Subjective Still wearing prosthesis 2hrs 2x/day.  Reports swelling is better however is concerned about amount of sweating due to hot weather.  Discussed periodic drying of leg, however to not use deoderant at this time due to skin issues.    Limitations Standing;Walking;House hold activities   Patient Stated Goals To  use prosthesis to fish, work in yard/ flowers, work on cars   Currently in Pain? No/denies                         [facility] Adult PT Treatment/Exercise - 05/20/15 1508    Transfers   Transfers Sit to Stand;Stand to Sit   Sit to Stand From chair/3-in-1;6: Modified independent (Device/Increase time);With upper extremity assist;With armrests   Stand to Sit To chair/3-in-1;6: Modified independent (Device/Increase time);With upper extremity assist;With armrests   Ambulation/Gait   Ambulation/Gait Yes   Ambulation/Gait Assistance 5: Supervision;4: Min guard  S with crutches inside, min/guard w/ crutches outside   Ambulation Distance (Feet) 150 Feet  150' inside, 300' outside   Assistive device Prosthesis;Crutches   Gait Pattern Step-through pattern;Decreased step length - left;Decreased stance time - right;Decreased stride length;Decreased hip/knee flexion - right;Trunk flexed;Abducted- right;Poor foot clearance - right  R knee varum during stance    Ambulation Surface Level;Indoor;Unlevel;Outdoor;Paved;Gravel;Grass  mulch   Gait velocity --   Stairs Yes   Stairs Assistance 5: Supervision   Stairs Assistance Details (indicate cue type and  reason) Reviewed negotiation of stairs with single rail and crutch, pt able to return demo x 2 reps at S level with min cues for switching crutch if rail only on one side.     Stair Management Technique One rail Right;One rail Left;Step to pattern;Forwards;With crutches  single crutch   Number of Stairs 4  x 2 reps   Height of Stairs 6   Ramp 5: Supervision  prosthesis & crutches   Ramp Details (indicate cue type and reason) Pt requires min cues for crutch placement and R heel placement upon descent, but was able to perform at S level.    Curb 4: Min assist  min/guard, prosthesis & crutches   Curb Details (indicate cue type and reason) Pt returned demo of curb with B axillary crutches as in previous session with min cues for stepping sequence.    Gait Comments Performed gait on outdoor surfaces x 300' with use of crutches (grass, gravel, mulch, etc) at min/guard level.  Cues for increased attention to placement of RLE and placement of crutches to ensure stable prior to stepping RLE.     Self-Care   Self-Care Other Self-Care Comments   Other Self-Care Comments  Educated and showed pt (via computer) picture of L gas adaptation for car.  Provided education to pt that this PT would need to confirm safety precautions (clearance from MD, DMV, etc) before purchasing and driving on his own.  Pt verbalized understanding.    Exercises   Exercises --   Other Exercises  --   Prosthetics   Prosthetic Care Comments  Overall wound seems to be improving and has less redness, however did note area of redness/breakdown from where adhesive was placed, however also seems to be improving per pt report.    Current prosthetic wear tolerance (days/week)  daily since last visit   Current prosthetic wear tolerance (#hours/day)  2 hrs 2x day    Edema Pt reports swelling has improved since last visit in LEs, however still feels that hands and arms are swollen.     Residual limb condition  21mm wound at distal limb, no  signs of infection   Education Provided Skin check;Residual limb care;Prosthetic cleaning;Correct ply sock adjustment;Proper wear schedule/adjustment;Proper weight-bearing schedule/adjustment;Other (comment);Proper Doffing;Proper Donning   Person(s) Educated Patient   Education Method Explanation;Demonstration;Verbal cues   Education Method Verbalized understanding;Returned  demonstration;Tactile cues required;Verbal cues required   Donning Prosthesis Minimal assist  to ensure proper pin placement and cues liner placement   Doffing Prosthesis Modified independent (device/increased time)                  PT Short Term Goals - 05/12/15 1315    PT SHORT TERM GOAL #1   Title Patient verbalizes proper prosthetic care with minimal cues for problem solving. (Target Date: 06/10/2015)   Time 4   Period Weeks   Status New   PT SHORT TERM GOAL #2   Title Patient tolerates >12 hrs/day without negative change in skin issues and no tenderness to limb.  (Target Date: 06/10/2015)   Time 4   Period Weeks   Status New   PT SHORT TERM GOAL #3   Title Patient ambulates 250' with single point cane & prosthesis with cues for gait deviations only no physical assist needed.  (Target Date: 06/10/2015)   Time 4   Period Weeks   Status New   PT SHORT TERM GOAL #4   Title Patient negotiates ramps, curbs & stairs (1 rail) with single point cane & prosthesis with supervision.  (Target Date: 06/10/2015)   Time 4   Period Weeks   Status New   PT SHORT TERM GOAL #5   Title Berg Balance >45/56  (Target Date: 06/10/2015)   Time 4   Period Weeks   Status New           PT Long Term Goals - 05/12/15 1315    PT LONG TERM GOAL #1   Title Patient verbalizes and demonstrates proper prosthetic care to enable safe use of prosthesis.  (Target Date: 07/08/2015)   Time 8   Period Weeks   Status New   PT LONG TERM GOAL #2   Title Patient tolerates wear of prosthesis >90% of awake hours with no skin issues or  tenderness to enable functional use throughout his day.  (Target Date: 07/08/2015)   Time 8   Period Weeks   Status New   PT LONG TERM GOAL #3   Title Patient reports chronic pain similar to prior level with increase in pain </= 2 increments with standing & gait activities.  (Target Date: 07/08/2015)   Time 8   Period Weeks   Status New   PT LONG TERM GOAL #4   Title Patient self-reports >20% increase in functional status using FOTO.  (Target Date: 07/08/2015)   Time 8   Period Weeks   Status New   PT LONG TERM GOAL #5   Title Patient ambulates >500' including grass, ramps, curbs & stairs (1 rail) with single point cane or less and prosthesis modified independent.  (Target Date: 07/08/2015)   Time 8   Period Weeks   Status New   Additional Long Term Goals   Additional Long Term Goals Yes   PT LONG TERM GOAL #6   Title Patient ambulates 100' with prosthesis only carrying 15# objects around furniture indpendently.  (Target Date: 07/08/2015)   Time 8   Period Weeks   Status New               Plan - 05/20/15 1643    Clinical Impression Statement Skilled session focused on prosthetic car, residual limb care and wear schedule to allowing continued healing of wound of R residual limb.  Pt verbalized understanding and wound seems to be improving.  Reviewed stair, curb and ramp negotiation with crutches to ensure safety  at home and community.  Performed gait on outdoor surfaces with use of crutches with recommendation on having someone with him for "CGA" to ensure safety with gait outdoors.  Pt verbalized understanding, continue POC.    Pt will benefit from skilled therapeutic intervention in order to improve on the following deficits Abnormal gait;Decreased activity tolerance;Decreased balance;Decreased endurance;Decreased mobility;Decreased range of motion;Decreased knowledge of use of DME;Decreased strength;Postural dysfunction;Prosthetic Dependency   Rehab Potential Good   PT  Frequency 2x / week   PT Duration 8 weeks   PT Treatment/Interventions ADLs/Self Care Home Management;DME Instruction;Gait training;Stair training;Functional mobility training;Therapeutic activities;Therapeutic exercise;Balance training;Neuromuscular re-education;Patient/family education;Prosthetic Training   PT Next Visit Plan Review prosthetic care, donning, etc, assess wound, review gait outdoors with crutches for safe carryover, balance on foam/rocker board.    Consulted and Agree with Plan of Care Patient        Problem List Patient Active Problem List   Diagnosis Date Noted  . Sepsis 01/14/2015  . Opacity of lung on imaging study 01/14/2015  . Angina pectoris associated with type 2 diabetes mellitus 10/28/2014  . Wound infection 09/26/2014  . Diabetes mellitus type 2, controlled 08/25/2014  . Essential hypertension 08/25/2014  . Hyperlipidemia 08/25/2014  . 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 Foot ulcer, left 11/24/2012  . Chronic total occlusion of artery of the extremities 04/08/2012  . Cellulitis 03/19/2012  . PAD (peripheral artery disease) 02/26/2012  . Dizziness 02/26/2012  . Onychomycosis 02/01/2012  . Diabetes mellitus 11/01/2011  . Occlusion and stenosis of carotid artery without mention of cerebral infarction 06/12/2011  . Aftercare following surgery of the circulatory system, [place] 06/12/2011  . GERD (gastroesophageal reflux disease) 05/08/2011  . Barrett's esophagus without dysplasia 05/08/2011  . PVD (peripheral vascular disease) 02/01/2011  . HTN (hypertension) 02/01/2011  . Tobacco abuse 02/01/2011    Cameron Sprang, PT, MPT [facility] [address] [place], [place], [address] Phone: [phone]   Fax:  [phone] 05/20/2015, 4:48 PM

## 2015-05-23 ENCOUNTER — Ambulatory Visit: Payer: BLUE CROSS/BLUE SHIELD | Admitting: Physical Therapy

## 2015-05-24 ENCOUNTER — Ambulatory Visit: Payer: BLUE CROSS/BLUE SHIELD | Admitting: Physical Therapy

## 2015-05-24 ENCOUNTER — Encounter: Payer: Self-pay | Admitting: Physical Therapy

## 2015-05-24 DIAGNOSIS — R269 Unspecified abnormalities of gait and mobility: Secondary | ICD-10-CM

## 2015-05-24 DIAGNOSIS — Z89511 Acquired absence of right leg below knee: Secondary | ICD-10-CM

## 2015-05-24 NOTE — Therapy (Signed)
[facility] [address] [place], [place], [address] Phone: [phone]   Fax:  [phone]  Patient Details  Name: THAYNE OKIN MRN: [id number] Date of Birth: 1955/12/09 Referring Provider:  Anda Kraft, MD  Encounter Date: 05/24/2015  Mr. Colletta arrived to PT for appointment. His prosthetic lock mechanism is broken which means he has not suspension for his prosthesis. PT called prosthetist who will see pt immediately.  Jamey Reas PT, DPT 05/24/2015, 3:20 PM  [facility] [address] [place], [place], [address] Phone: [phone]   Fax:  [phone]

## 2015-05-25 ENCOUNTER — Ambulatory Visit: Payer: BLUE CROSS/BLUE SHIELD | Admitting: Physical Therapy

## 2015-05-26 ENCOUNTER — Encounter: Payer: Self-pay | Admitting: Physical Therapy

## 2015-05-26 ENCOUNTER — Ambulatory Visit: Payer: BLUE CROSS/BLUE SHIELD | Admitting: Physical Therapy

## 2015-05-26 DIAGNOSIS — R2689 Other abnormalities of gait and mobility: Secondary | ICD-10-CM

## 2015-05-26 DIAGNOSIS — Z4789 Encounter for other orthopedic aftercare: Secondary | ICD-10-CM

## 2015-05-26 DIAGNOSIS — R6889 Other general symptoms and signs: Secondary | ICD-10-CM | POA: Diagnosis not present

## 2015-05-26 DIAGNOSIS — R2681 Unsteadiness on feet: Secondary | ICD-10-CM

## 2015-05-26 DIAGNOSIS — R269 Unspecified abnormalities of gait and mobility: Secondary | ICD-10-CM

## 2015-05-26 DIAGNOSIS — R531 Weakness: Secondary | ICD-10-CM

## 2015-05-29 NOTE — Therapy (Signed)
[facility] [address] [place], [place], [address] Phone: [phone]   Fax:  [phone]  Physical Therapy Treatment  Patient Details  Name: [name] MRN: [id number] Date of Birth: 12-Jul-1956 Referring Provider:  Anda Kraft, MD  Encounter Date: 05/26/2015     05/26/15 1116  PT Visits / Re-Eval  Visit Number 4  Number of Visits 17  Date for PT Re-Evaluation 07/08/15  Authorization  Authorization Type Medicare 2nd G-code every 10th visit  PT Time Calculation  PT Start Time 1105  PT Stop Time 1145  PT Time Calculation (min) 40 min  PT - End of Session  Equipment Utilized During Treatment Gait belt  Activity Tolerance Patient tolerated treatment well  Behavior During Therapy [facility] for tasks assessed/performed    Past Medical History  Diagnosis Date  . Carotid artery occlusion 11/10/10    LEFT CAROTID ENDARTERECTOMY  . Diabetes mellitus   . Hypertension   . Hyperlipidemia   . COPD (chronic obstructive pulmonary disease)   . DJD (degenerative joint disease)   . GERD (gastroesophageal reflux disease)   . Substernal chest pain   . 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 Slurred speech     AS PER WIFE IN D/C NOTE 11/10/10  . PAD (peripheral artery disease)     Distal aortogram June 2012. Atherectomy left popliteal artery July 2012.   . Barrett's esophagus   . Fatty liver   . Diverticulosis of colon (without mention of hemorrhage)   . Emphysema of lung   . Osteomyelitis   . Complication of anesthesia     BP WENT UP AT [facility] "  . Sleep apnea     has not used CPAP in e years - took self off  . H/O hiatal hernia   . Neuromuscular disorder     peripheral neuropathy  . Full dentures   . Wears glasses   . CHF (congestive heart failure)   . History of echocardiogram 08/22/2011    Mildly hypertrophic left ventricle with normal systolic function and mild diastolic dysfunction. mildly  dilated left atrium. no significant valvular abnormalities. no pericardial effusion no change from previous study 10/2010. the ejection fraction =>55%  . History of cardiovascular stress test 03/23/2011    ejection fraction measured 51%, with an end-diastolic volume of 123456 ml and an end-systolic volume of 81 ml. no evidence of myocardial ischemia.  . H/O Doppler ultrasound 06/28/2012    Lower venous duplex left-- findings consistent with acute deep vein thrombosis involving the personal vein of the left lower extremity. No evidence of Baker's cyst on the left. Duplex scan of the left lower extremity arteries reveal adequate blood flow.   . History of Holter monitoring 02/14/2012    Scan quality is fair due to baseline artifact. Predominant rhythm is Sinus. Veentricular Ectopy was noted in single beat form. The total recording time was 24:00 hours. total beats =[id number]. the total time analyzed was 23:58 hours. Heart  Max = 102 at 1:18:22, Min= 52 at 9:56:19 am., Avg= 70. There were 0 pause(s) > 2.5 seconds. the longest pause = 0.00 sec. at 1:51 pm(1)  . OSA (obstructive sleep apnea) 12/26/08    AHI of 6.3/hr overall, 32.7/hr during REM sleep and an RDI of 24.8/hr and 50.9/hr.    Past Surgical History  Procedure Laterality Date  . Laminectomy      X 3 LUMBAR AND X 2 CERVICAL SPINE  OPERATIONS  . Carotid endarterectomy  11/10/10  . Hernia repair      LEFT INGUINAL AND UMBILICAL REPAIRS  . Anterior fusion cervical spine  02/06/06    C4-5, C5-6, C6-7; SURGEON DR. MAX COHEN  . Penile prosthesis implant  08/14/05    INFRAPUBIC INSERTION OF INFLATABLE PENILE PROSTHESIS; SURGEON DR. Amalia Hailey  . Laparoscopic cholecystectomy w/ cholangiography  11/09/04    SURGEON DR. Luella Cook  . Total knee arthroplasty  07/2002    RIGHT KNEE ; SURGEON  DR. [name] ALSO HAD ARTHROSCOPIC RIGHT KNEE IN  10/2001  . Shoulder arthroscopy    . Cholecystectomy    . Amputation  11/05/2011    Procedure: AMPUTATION RAY;  Surgeon: Wylene Simmer, MD;  Location: [facility];  Service: Orthopedics;  Laterality: Right;  Amputation of Right 4&5th Toes  . Femoral artery stent      x6  . Amputation Left 11/26/2012    Procedure: AMPUTATION RAY;  Surgeon: Wylene Simmer, MD;  Location: [facility];  Service: Orthopedics;  Laterality: Left;  fourth ray amputation  . Cardiac catheterization  10/31/04    2009  . Carpal tunnel release Right 10/21/2013    Procedure: RIGHT CARPAL TUNNEL RELEASE;  Surgeon: Wynonia Sours, MD;  Location: [facility];  Service: Orthopedics;  Laterality: Right;  . Ulnar nerve transposition Right 10/21/2013    Procedure: RIGHT ELBOW  ULNAR NERVE DECOMPRESSION;  Surgeon: Wynonia Sours, MD;  Location: [facility];  Service: Orthopedics;  Laterality: Right;  . Joint replacement Right 2001    Total  . Spine surgery    . Neck surgery    . Esophageal manometry Bilateral 07/19/2014    Procedure: ESOPHAGEAL MANOMETRY (EM);  Surgeon: Jerene Bears, MD;  Location: [facility];  Service: Gastroenterology;  Laterality: Bilateral;  . Lower extremity angiogram N/A 03/19/2012    Procedure: LOWER EXTREMITY ANGIOGRAM;  Surgeon: Burnell Blanks, MD;  Location: [facility];  Service: Cardiovascular;  Laterality: N/A;  . Amputation Right 08/27/2014    Procedure: Transmetatarsal Amputation;  Surgeon: Newt Minion, MD;  Location: [facility];  Service: Orthopedics;  Laterality: Right;  . Left heart catheterization with coronary angiogram N/A 10/29/2014    Procedure: LEFT HEART CATHETERIZATION WITH CORONARY ANGIOGRAM;  Surgeon: Laverda Page, MD;  Location: [facility] C[facility];  Service: Cardiovascular;  Laterality: N/A;  . Percutaneous coronary stent intervention (pci-s) Right 10/29/2014    Procedure: PERCUTANEOUS CORONARY STENT INTERVENTION (PCI-S);  Surgeon: Laverda Page, MD;  Location: [facility] C[facility];  Service: Cardiovascular;  Laterality: Right;  . Amputation Right 01/14/2015    Procedure: AMPUTATION BELOW KNEE;  Surgeon:  Newt Minion, MD;  Location: [facility];  Service: Orthopedics;  Laterality: Right;  . Carotid endarterectomy Left 11/10/2010    Subtotal occlusion of left internal carotid artery with left hemispheric transient ischemic attacks.    There were no vitals filed for this visit.  Visit Diagnosis:  Abnormality of gait  Unsteadiness  Balance problems  Decreased functional activity tolerance  Encounter for prosthetic gait training  Functional weakness   05/26/15 1111  Symptoms/Limitations  Subjective No new complaints. Had pin locking mechanism fixed after last PT session. No falls.   Pain Assessment  Currently in Pain? Yes  Pain Score 6  Pain Location Other (Comment) (back, neck and LE neuropathy)  Pain Orientation Left;Right  Pain Descriptors / Indicators Aching;Numbness;Tingling;Sore  Pain Type Neuropathic pain;Chronic pain  Pain Onset More than a month ago  Pain Frequency Constant  Aggravating Factors  weather, forgetting medications, increased activity  Pain Relieving Factors medication, rest       05/26/15 1117  Transfers  Sit to Stand 5: Supervision;With upper extremity assist;From chair/3-in-1;With armrests  Stand to Sit 5: Supervision;With upper extremity assist;To chair/3-in-1;With armrests  Ambulation/Gait  Ambulation/Gait Yes  Ambulation/Gait Assistance 5: Supervision  Ambulation/Gait Assistance Details cues on posture and correct use of crutches (not to "hang" on them with gait). cues to correct gait deviations.  Ambulation Distance (Feet) 220 Feet (x1 indoors, >/= 500 outdoors)  Assistive device Prosthesis;Crutches  Gait Pattern Step-through pattern;Decreased step length - left;Decreased stance time - right;Decreased stride length;Decreased hip/knee flexion - right;Trunk flexed;Abducted- right;Poor foot clearance - right  Ambulation Surface Level;Unlevel;Indoor;Outdoor;Paved;Gravel;Grass  Ramp 5: Supervision (with crutches/prosthesis)  Ramp Details (indicate cue  type and reason) cues on sequence and technique  Curb Other (comment) (min guard assist with crutches and prosthesis)  Curb Details (indicate cue type and reason) cues on posture and foot position   Prosthetics  Prosthetic Care Comments  Pt to increase wear time to 3 hours 2 x day starting today.  Current prosthetic wear tolerance (days/week)  daily since last visit  Current prosthetic wear tolerance (#hours/day)  2 hrs 2x day   Residual limb condition  wound healing, no other issues per pt report. wound covered this am with band aide (pt with allergy to tegaderm), did not remove to check wound today.                          Education Provided Residual limb care;Correct ply sock adjustment;Proper wear schedule/adjustment  Person(s) Educated Patient  Education Method Explanation;Demonstration  Education Method Verbalized understanding  Donning Prosthesis 5  Doffing Prosthesis 6         PT Short Term Goals - 05/12/15 1315    PT SHORT TERM GOAL #1   Title Patient verbalizes proper prosthetic care with minimal cues for problem solving. (Target Date: 06/10/2015)   Time 4   Period Weeks   Status New   PT SHORT TERM GOAL #2   Title Patient tolerates >12 hrs/day without negative change in skin issues and no tenderness to limb.  (Target Date: 06/10/2015)   Time 4   Period Weeks   Status New   PT SHORT TERM GOAL #3   Title Patient ambulates 250' with single point cane & prosthesis with cues for gait deviations only no physical assist needed.  (Target Date: 06/10/2015)   Time 4   Period Weeks   Status New   PT SHORT TERM GOAL #4   Title Patient negotiates ramps, curbs & stairs (1 rail) with single point cane & prosthesis with supervision.  (Target Date: 06/10/2015)   Time 4   Period Weeks   Status New   PT SHORT TERM GOAL #5   Title Berg Balance >45/56  (Target Date: 06/10/2015)   Time 4   Period Weeks   Status New           PT Long Term Goals - 05/12/15 1315    PT LONG TERM  GOAL #1   Title Patient verbalizes and demonstrates proper prosthetic care to enable safe use of prosthesis.  (Target Date: 07/08/2015)   Time 8   Period Weeks   Status New   PT LONG TERM GOAL #2   Title Patient tolerates wear of prosthesis >90% of awake hours with no skin issues or tenderness to enable functional  use throughout his day.  (Target Date: 07/08/2015)   Time 8   Period Weeks   Status New   PT LONG TERM GOAL #3   Title Patient reports chronic pain similar to prior level with increase in pain </= 2 increments with standing & gait activities.  (Target Date: 07/08/2015)   Time 8   Period Weeks   Status New   PT LONG TERM GOAL #4   Title Patient self-reports >20% increase in functional status using FOTO.  (Target Date: 07/08/2015)   Time 8   Period Weeks   Status New   PT LONG TERM GOAL #5   Title Patient ambulates >500' including grass, ramps, curbs & stairs (1 rail) with single point cane or less and prosthesis modified independent.  (Target Date: 07/08/2015)   Time 8   Period Weeks   Status New   Additional Long Term Goals   Additional Long Term Goals Yes   PT LONG TERM GOAL #6   Title Patient ambulates 100' with prosthesis only carrying 15# objects around furniture indpendently.  (Target Date: 07/08/2015)   Time 8   Period Weeks   Status New        05/26/15 1116  Plan  Clinical Impression Statement Pt making steady progress with use of crutches and prosthesis toward goals. Will plan to work more on balance and decreased UE support (cane vs single crutch) with next session. Progressing toward goals.  Pt will benefit from skilled therapeutic intervention in order to improve on the following deficits Abnormal gait;Decreased activity tolerance;Decreased balance;Decreased endurance;Decreased mobility;Decreased range of motion;Decreased knowledge of use of DME;Decreased strength;Postural dysfunction;Prosthetic Dependency  Rehab Potential Good  PT Frequency 2x / week  PT  Duration 8 weeks  PT Treatment/Interventions ADLs/Self Care Home Management;DME Instruction;Gait training;Stair training;Functional mobility training;Therapeutic activities;Therapeutic exercise;Balance training;Neuromuscular re-education;Patient/family education;Prosthetic Training  PT Next Visit Plan Review prosthetic care, donning, etc, assess wound, gait with single UE support (cane vs single crutch), balance on foam/rocker board, dynamic gait balance  Consulted and Agree with Plan of Care Patient     Problem List Patient Active Problem List   Diagnosis Date Noted  . Sepsis 01/14/2015  . Opacity of lung on imaging study 01/14/2015  . Angina pectoris associated with type 2 diabetes mellitus 10/28/2014  . Wound infection 09/26/2014  . Diabetes mellitus type 2, controlled 08/25/2014  . Essential hypertension 08/25/2014  . Hyperlipidemia 08/25/2014  . 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 Foot ulcer, left 11/24/2012  . Chronic total occlusion of artery of the extremities 04/08/2012  . Cellulitis 03/19/2012  . PAD (peripheral artery disease) 02/26/2012  . Dizziness 02/26/2012  . Onychomycosis 02/01/2012  . Diabetes mellitus 11/01/2011  . Occlusion and stenosis of carotid artery without mention of cerebral infarction 06/12/2011  . Aftercare following surgery of the circulatory system, [place] 06/12/2011  . GERD (gastroesophageal reflux disease) 05/08/2011  . Barrett's esophagus without dysplasia 05/08/2011  . PVD (peripheral vascular disease) 02/01/2011  . HTN (hypertension) 02/01/2011  . Tobacco abuse 02/01/2011    Willow Ora 05/29/2015, 12:01 AM  Willow Ora, PTA, [facility] [address] [place], [place] [address] [phone] 05/29/2015, 12:11 AM

## 2015-05-31 ENCOUNTER — Ambulatory Visit: Payer: BLUE CROSS/BLUE SHIELD | Admitting: Physical Therapy

## 2015-05-31 DIAGNOSIS — R2689 Other abnormalities of gait and mobility: Secondary | ICD-10-CM

## 2015-05-31 DIAGNOSIS — R269 Unspecified abnormalities of gait and mobility: Secondary | ICD-10-CM

## 2015-05-31 DIAGNOSIS — R2681 Unsteadiness on feet: Secondary | ICD-10-CM

## 2015-05-31 DIAGNOSIS — R531 Weakness: Secondary | ICD-10-CM

## 2015-05-31 DIAGNOSIS — R6889 Other general symptoms and signs: Secondary | ICD-10-CM | POA: Diagnosis not present

## 2015-05-31 DIAGNOSIS — Z4789 Encounter for other orthopedic aftercare: Secondary | ICD-10-CM

## 2015-05-31 DIAGNOSIS — Z89511 Acquired absence of right leg below knee: Secondary | ICD-10-CM

## 2015-05-31 NOTE — Therapy (Signed)
[facility] [address] [place], [place], [address] Phone: [phone]   Fax:  [phone]  Physical Therapy Treatment  Patient Details  Name: [name] MRN: [id number] Date of Birth: Feb 14, 1956 Referring Provider:  Anda Kraft, MD  Encounter Date: 05/31/2015      PT End of Session - 05/31/15 1100    Visit Number 5   Number of Visits 17   Date for PT Re-Evaluation 07/08/15   Authorization Type Medicare 2nd G-code every 10th visit   PT Start Time 1115   PT Stop Time 1148   PT Time Calculation (min) 33 min   Equipment Utilized During Treatment Gait belt   Activity Tolerance Patient tolerated treatment well   Behavior During Therapy [facility] for tasks assessed/performed      Past Medical History  Diagnosis Date  . Carotid artery occlusion 11/10/10    LEFT CAROTID ENDARTERECTOMY  . Diabetes mellitus   . Hypertension   . Hyperlipidemia   . COPD (chronic obstructive pulmonary disease)   . DJD (degenerative joint disease)   . GERD (gastroesophageal reflux disease)   . Substernal chest pain   . 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 Slurred speech     AS PER WIFE IN D/C NOTE 11/10/10  . PAD (peripheral artery disease)     Distal aortogram June 2012. Atherectomy left popliteal artery July 2012.   . Barrett's esophagus   . Fatty liver   . Diverticulosis of colon (without mention of hemorrhage)   . Emphysema of lung   . Osteomyelitis   . Complication of anesthesia     BP WENT UP AT [facility] "  . Sleep apnea     has not used CPAP in e years - took self off  . H/O hiatal hernia   . Neuromuscular disorder     peripheral neuropathy  . Full dentures   . Wears glasses   . CHF (congestive heart failure)   . History of echocardiogram 08/22/2011    Mildly hypertrophic left ventricle with normal systolic function and mild diastolic dysfunction. mildly dilated left atrium. no significant valvular  abnormalities. no pericardial effusion no change from previous study 10/2010. the ejection fraction =>55%  . History of cardiovascular stress test 03/23/2011    ejection fraction measured 51%, with an end-diastolic volume of 123456 ml and an end-systolic volume of 81 ml. no evidence of myocardial ischemia.  . H/O Doppler ultrasound 06/28/2012    Lower venous duplex left-- findings consistent with acute deep vein thrombosis involving the personal vein of the left lower extremity. No evidence of Baker's cyst on the left. Duplex scan of the left lower extremity arteries reveal adequate blood flow.   . History of Holter monitoring 02/14/2012    Scan quality is fair due to baseline artifact. Predominant rhythm is Sinus. Veentricular Ectopy was noted in single beat form. The total recording time was 24:00 hours. total beats =[id number]. the total time analyzed was 23:58 hours. Heart  Max = 102 at 1:18:22, Min= 52 at 9:56:19 am., Avg= 70. There were 0 pause(s) > 2.5 seconds. the longest pause = 0.00 sec. at 1:51 pm(1)  . OSA (obstructive sleep apnea) 12/26/08    AHI of 6.3/hr overall, 32.7/hr during REM sleep and an RDI of 24.8/hr and 50.9/hr.    Past Surgical History  Procedure Laterality Date  . Laminectomy      X 3 LUMBAR AND X 2  CERVICAL SPINE OPERATIONS  . Carotid endarterectomy  11/10/10  . Hernia repair      LEFT INGUINAL AND UMBILICAL REPAIRS  . Anterior fusion cervical spine  02/06/06    C4-5, C5-6, C6-7; SURGEON DR. MAX COHEN  . Penile prosthesis implant  08/14/05    INFRAPUBIC INSERTION OF INFLATABLE PENILE PROSTHESIS; SURGEON DR. Amalia Hailey  . Laparoscopic cholecystectomy w/ cholangiography  11/09/04    SURGEON DR. Luella Cook  . Total knee arthroplasty  07/2002    RIGHT KNEE ; SURGEON  DR. [name] ALSO HAD ARTHROSCOPIC RIGHT KNEE IN  10/2001  . Shoulder arthroscopy    . Cholecystectomy    . Amputation  11/05/2011    Procedure: AMPUTATION RAY;  Surgeon: Wylene Simmer, MD;  Location: [place];  Service:  Orthopedics;  Laterality: Right;  Amputation of Right 4&5th Toes  . Femoral artery stent      x6  . Amputation Left 11/26/2012    Procedure: AMPUTATION RAY;  Surgeon: Wylene Simmer, MD;  Location: [place];  Service: Orthopedics;  Laterality: Left;  fourth ray amputation  . Cardiac catheterization  10/31/04    2009  . Carpal tunnel release Right 10/21/2013    Procedure: RIGHT CARPAL TUNNEL RELEASE;  Surgeon: Wynonia Sours, MD;  Location: [facility];  Service: Orthopedics;  Laterality: Right;  . Ulnar nerve transposition Right 10/21/2013    Procedure: RIGHT ELBOW  ULNAR NERVE DECOMPRESSION;  Surgeon: Wynonia Sours, MD;  Location: [place];  Service: Orthopedics;  Laterality: Right;  . Joint replacement Right 2001    Total  . Spine surgery    . Neck surgery    . Esophageal manometry Bilateral 07/19/2014    Procedure: ESOPHAGEAL MANOMETRY (EM);  Surgeon: Jerene Bears, MD;  Location: [facility];  Service: Gastroenterology;  Laterality: Bilateral;  . Lower extremity angiogram N/A 03/19/2012    Procedure: LOWER EXTREMITY ANGIOGRAM;  Surgeon: Burnell Blanks, MD;  Location[facility] C[facility] LAB;  Service: Cardiovascular;  Laterality: N/A;  . Amputation Right 08/27/2014    Procedure: Transmetatarsal Amputation;  Surgeon: Newt Minion, MD;  Location: [place];  Service: Orthopedics;  Laterality: Right;  . Left heart catheterization with coronary angiogram N/A 10/29/2014    Procedure: LEFT HEART CATHETERIZATION WITH CORONARY ANGIOGRAM;  Surgeon: Laverda Page, MD;  Location: [facility] CATH LAB;  Service: Cardiovascular;  Laterality: N/A;  . Percutaneous coronary stent intervention (pci-s) Right 10/29/2014    Procedure: PERCUTANEOUS CORONARY STENT INTERVENTION (PCI-S);  Surgeon: Laverda Page, MD;  Location: [facility] CATH LAB;  Service: Cardiovascular;  Laterality: Right;  . Amputation Right 01/14/2015    Procedure: AMPUTATION BELOW KNEE;  Surgeon: Newt Minion, MD;  Location: [place];   Service: Orthopedics;  Laterality: Right;  . Carotid endarterectomy Left 11/10/2010    Subtotal occlusion of left internal carotid artery with left hemispheric transient ischemic attacks.    There were no vitals filed for this visit.  Visit Diagnosis:  Abnormality of gait  Unsteadiness  Balance problems  Decreased functional activity tolerance  Encounter for prosthetic gait training  Functional weakness  Status post below knee amputation of right lower extremity      Subjective Assessment - 05/31/15 1114    Subjective He is wearing prosthesis ~7hrs total per day with no issues. He may have "pneumonia" He is seeing his PCP today after PT. No fever or signs of infection but increased dyspnea.   Currently in Pain? No/denies  [place] Adult PT Treatment/Exercise - 05/31/15 1115    Transfers   Sit to Stand 5: Supervision;With upper extremity assist;From chair/3-in-1;With armrests   Stand to Sit 5: Supervision;With upper extremity assist;To chair/3-in-1;With armrests   Ambulation/Gait   Ambulation/Gait Yes   Ambulation/Gait Assistance 5: Supervision;4: Min assist  initially minA progressed to SBA   Ambulation/Gait Assistance Details verbal cues & demo on cane use, sequence, posture   Ambulation Distance (Feet) 500 Feet  250' X 2 indoors & 500' outdoors   Assistive device Prosthesis;Straight cane   Gait Pattern Step-through pattern;Decreased step length - left;Decreased stance time - right;Decreased stride length;Decreased hip/knee flexion - right;Trunk flexed;Abducted- right;Poor foot clearance - right   Ambulation Surface Indoor;Level;Outdoor;Paved;Gravel;Grass   Stairs Yes   Stairs Assistance 5: Supervision   Stairs Assistance Details (indicate cue type and reason) PT cued with demo on reciprocal technique with prosthesis    Stair Management Technique Two rails;One rail Right;With cane  initially with 2 rails, progressed to 1 rail / cane    Number of Stairs 4  10 reps   Ramp 5: Supervision  with cane/prosthesis   Ramp Details (indicate cue type and reason) cues on technique with cane & prosthesis   Curb 5: Supervision  cane & prosthesis   Curb Details (indicate cue type and reason) demo, verbal cues on technique with cane & prosthesis   High Level Balance   High Level Balance Activities Negotiating over obstacles;Negotitating around obstacles  cane & prosthesis   High Level Balance Comments PT demo & cued technique with prosthesis   Prosthetics   Prosthetic Care Comments  Wear if out of bed when sick   Current prosthetic wear tolerance (days/week)  daily   Current prosthetic wear tolerance (#hours/day)  increase to 4-5 hrs 2x/day   Edema non-pitting   Residual limb condition  wound healing with no signs of infection   Education Provided Residual limb care;Correct ply sock adjustment;Proper wear schedule/adjustment;Proper Donning   Person(s) Educated Patient   Education Method Explanation;Verbal cues   Education Method Verbalized understanding;Verbal cues required;Needs further instruction   Donning Prosthesis Supervision   Doffing Prosthesis Modified independent (device/increased time)                  PT Short Term Goals - 05/12/15 1315    PT SHORT TERM GOAL #1   Title Patient verbalizes proper prosthetic care with minimal cues for problem solving. (Target Date: 06/10/2015)   Time 4   Period Weeks   Status New   PT SHORT TERM GOAL #2   Title Patient tolerates >12 hrs/day without negative change in skin issues and no tenderness to limb.  (Target Date: 06/10/2015)   Time 4   Period Weeks   Status New   PT SHORT TERM GOAL #3   Title Patient ambulates 250' with single point cane & prosthesis with cues for gait deviations only no physical assist needed.  (Target Date: 06/10/2015)   Time 4   Period Weeks   Status New   PT SHORT TERM GOAL #4   Title Patient negotiates ramps, curbs & stairs (1 rail) with  single point cane & prosthesis with supervision.  (Target Date: 06/10/2015)   Time 4   Period Weeks   Status New   PT SHORT TERM GOAL #5   Title Berg Balance >45/56  (Target Date: 06/10/2015)   Time 4   Period Weeks   Status New           PT Long  Term Goals - 05/12/15 1315    PT LONG TERM GOAL #1   Title Patient verbalizes and demonstrates proper prosthetic care to enable safe use of prosthesis.  (Target Date: 07/08/2015)   Time 8   Period Weeks   Status New   PT LONG TERM GOAL #2   Title Patient tolerates wear of prosthesis >90% of awake hours with no skin issues or tenderness to enable functional use throughout his day.  (Target Date: 07/08/2015)   Time 8   Period Weeks   Status New   PT LONG TERM GOAL #3   Title Patient reports chronic pain similar to prior level with increase in pain </= 2 increments with standing & gait activities.  (Target Date: 07/08/2015)   Time 8   Period Weeks   Status New   PT LONG TERM GOAL #4   Title Patient self-reports >20% increase in functional status using FOTO.  (Target Date: 07/08/2015)   Time 8   Period Weeks   Status New   PT LONG TERM GOAL #5   Title Patient ambulates >500' including grass, ramps, curbs & stairs (1 rail) with single point cane or less and prosthesis modified independent.  (Target Date: 07/08/2015)   Time 8   Period Weeks   Status New   Additional Long Term Goals   Additional Long Term Goals Yes   PT LONG TERM GOAL #6   Title Patient ambulates 100' with prosthesis only carrying 15# objects around furniture indpendently.  (Target Date: 07/08/2015)   Time 8   Period Weeks   Status New               Plan - 05/31/15 1100    Clinical Impression Statement Patient improved gait with cane including barriers with instruction & repetition. Patient is on target to meet STGs.    Pt will benefit from skilled therapeutic intervention in order to improve on the following deficits Abnormal gait;Decreased activity  tolerance;Decreased balance;Decreased endurance;Decreased mobility;Decreased range of motion;Decreased knowledge of use of DME;Decreased strength;Postural dysfunction;Prosthetic Dependency   Rehab Potential Good   PT Frequency 2x / week   PT Duration 8 weeks   PT Treatment/Interventions ADLs/Self Care Home Management;DME Instruction;Gait training;Stair training;Functional mobility training;Therapeutic activities;Therapeutic exercise;Balance training;Neuromuscular re-education;Patient/family education;Prosthetic Training   PT Next Visit Plan Review prosthetic care, donning, etc, assess wound, gait with single UE support (cane vs single crutch), balance on foam/rocker board, dynamic gait balance   Consulted and Agree with Plan of Care Patient        Problem List Patient Active Problem List   Diagnosis Date Noted  . Sepsis 01/14/2015  . Opacity of lung on imaging study 01/14/2015  . Angina pectoris associated with type 2 diabetes mellitus 10/28/2014  . Wound infection 09/26/2014  . Diabetes mellitus type 2, controlled 08/25/2014  . Essential hypertension 08/25/2014  . Hyperlipidemia 08/25/2014  . 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 Foot ulcer, left 11/24/2012  . Chronic total occlusion of artery of the extremities 04/08/2012  . Cellulitis 03/19/2012  . PAD (peripheral artery disease) 02/26/2012  . Dizziness 02/26/2012  . Onychomycosis 02/01/2012  . Diabetes mellitus 11/01/2011  . Occlusion and stenosis of carotid artery without mention of cerebral infarction 06/12/2011  . Aftercare following surgery of the circulatory system, [place] 06/12/2011  . GERD (gastroesophageal reflux disease) 05/08/2011  . Barrett's esophagus without dysplasia 05/08/2011  . PVD (peripheral vascular disease) 02/01/2011  . HTN (hypertension) 02/01/2011  . Tobacco abuse 02/01/2011  Jamey Reas PT, DPT 05/31/2015, 8:37 PM  [facility] [address] [place], [place], [address] Phone: [phone]   Fax:  [phone]

## 2015-06-02 ENCOUNTER — Ambulatory Visit: Payer: BLUE CROSS/BLUE SHIELD | Admitting: Physical Therapy

## 2015-06-02 ENCOUNTER — Encounter: Payer: Self-pay | Admitting: Physical Therapy

## 2015-06-02 DIAGNOSIS — R2689 Other abnormalities of gait and mobility: Secondary | ICD-10-CM

## 2015-06-02 DIAGNOSIS — R269 Unspecified abnormalities of gait and mobility: Secondary | ICD-10-CM

## 2015-06-02 DIAGNOSIS — Z4789 Encounter for other orthopedic aftercare: Secondary | ICD-10-CM

## 2015-06-02 DIAGNOSIS — R531 Weakness: Secondary | ICD-10-CM

## 2015-06-02 DIAGNOSIS — R6889 Other general symptoms and signs: Secondary | ICD-10-CM | POA: Diagnosis not present

## 2015-06-02 DIAGNOSIS — R2681 Unsteadiness on feet: Secondary | ICD-10-CM

## 2015-06-04 NOTE — Therapy (Signed)
[facility] [address] [place], [place], [address] Phone: [phone]   Fax:  [phone]  Physical Therapy Treatment  Patient Details  Name: [name] MRN: [id number] Date of Birth: February 28, 1956 Referring Provider:  Anda Kraft, MD  Encounter Date: 06/02/2015   06/02/15 1419  PT Visits / Re-Eval  Visit Number 6  Number of Visits 17  Date for PT Re-Evaluation 07/08/15  Authorization  Authorization Type Medicare 2nd G-code every 10th visit  PT Time Calculation  PT Start Time 1415  PT Stop Time 1445  PT Time Calculation (min) 30 min  PT - End of Session  Equipment Utilized During Treatment Gait belt  Activity Tolerance Patient tolerated treatment well  Behavior During Therapy [facility] for tasks assessed/performed     Past Medical History  Diagnosis Date  . Carotid artery occlusion 11/10/10    LEFT CAROTID ENDARTERECTOMY  . Diabetes mellitus   . Hypertension   . Hyperlipidemia   . COPD (chronic obstructive pulmonary disease)   . DJD (degenerative joint disease)   . GERD (gastroesophageal reflux disease)   . Substernal chest pain   . 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 Slurred speech     AS PER WIFE IN D/C NOTE 11/10/10  . PAD (peripheral artery disease)     Distal aortogram June 2012. Atherectomy left popliteal artery July 2012.   . Barrett's esophagus   . Fatty liver   . Diverticulosis of colon (without mention of hemorrhage)   . Emphysema of lung   . Osteomyelitis   . Complication of anesthesia     BP WENT UP AT [facility] "  . Sleep apnea     has not used CPAP in e years - took self off  . H/O hiatal hernia   . Neuromuscular disorder     peripheral neuropathy  . Full dentures   . Wears glasses   . CHF (congestive heart failure)   . History of echocardiogram 08/22/2011    Mildly hypertrophic left ventricle with normal systolic function and mild diastolic dysfunction. mildly  dilated left atrium. no significant valvular abnormalities. no pericardial effusion no change from previous study 10/2010. the ejection fraction =>55%  . History of cardiovascular stress test 03/23/2011    ejection fraction measured 51%, with an end-diastolic volume of 123456 ml and an end-systolic volume of 81 ml. no evidence of myocardial ischemia.  . H/O Doppler ultrasound 06/28/2012    Lower venous duplex left-- findings consistent with acute deep vein thrombosis involving the personal vein of the left lower extremity. No evidence of Baker's cyst on the left. Duplex scan of the left lower extremity arteries reveal adequate blood flow.   . History of Holter monitoring 02/14/2012    Scan quality is fair due to baseline artifact. Predominant rhythm is Sinus. Veentricular Ectopy was noted in single beat form. The total recording time was 24:00 hours. total beats =[id number]. the total time analyzed was 23:58 hours. Heart  Max = 102 at 1:18:22, Min= 52 at 9:56:19 am., Avg= 70. There were 0 pause(s) > 2.5 seconds. the longest pause = 0.00 sec. at 1:51 pm(1)  . OSA (obstructive sleep apnea) 12/26/08    AHI of 6.3/hr overall, 32.7/hr during REM sleep and an RDI of 24.8/hr and 50.9/hr.    Past Surgical History  Procedure Laterality Date  . Laminectomy      X 3 LUMBAR AND X 2 CERVICAL SPINE OPERATIONS  .  Carotid endarterectomy  11/10/10  . Hernia repair      LEFT INGUINAL AND UMBILICAL REPAIRS  . Anterior fusion cervical spine  02/06/06    C4-5, C5-6, C6-7; SURGEON DR. MAX COHEN  . Penile prosthesis implant  08/14/05    INFRAPUBIC INSERTION OF INFLATABLE PENILE PROSTHESIS; SURGEON DR. Amalia Hailey  . Laparoscopic cholecystectomy w/ cholangiography  11/09/04    SURGEON DR. Luella Cook  . Total knee arthroplasty  07/2002    RIGHT KNEE ; SURGEON  DR. [name] ALSO HAD ARTHROSCOPIC RIGHT KNEE IN  10/2001  . Shoulder arthroscopy    . Cholecystectomy    . Amputation  11/05/2011    Procedure: AMPUTATION RAY;  Surgeon: Wylene Simmer, MD;  Location: [facility];  Service: Orthopedics;  Laterality: Right;  Amputation of Right 4&5th Toes  . Femoral artery stent      x6  . Amputation Left 11/26/2012    Procedure: AMPUTATION RAY;  Surgeon: Wylene Simmer, MD;  Location: [facility];  Service: Orthopedics;  Laterality: Left;  fourth ray amputation  . Cardiac catheterization  10/31/04    2009  . Carpal tunnel release Right 10/21/2013    Procedure: RIGHT CARPAL TUNNEL RELEASE;  Surgeon: Wynonia Sours, MD;  Location: [facility];  Service: Orthopedics;  Laterality: Right;  . Ulnar nerve transposition Right 10/21/2013    Procedure: RIGHT ELBOW  ULNAR NERVE DECOMPRESSION;  Surgeon: Wynonia Sours, MD;  Location: [facility];  Service: Orthopedics;  Laterality: Right;  . Joint replacement Right 2001    Total  . Spine surgery    . Neck surgery    . Esophageal manometry Bilateral 07/19/2014    Procedure: ESOPHAGEAL MANOMETRY (EM);  Surgeon: Jerene Bears, MD;  Location: [facility];  Service: Gastroenterology;  Laterality: Bilateral;  . Lower extremity angiogram N/A 03/19/2012    Procedure: LOWER EXTREMITY ANGIOGRAM;  Surgeon: Burnell Blanks, MD;  Location: [facility] CATH LAB;  Service: Cardiovascular;  Laterality: N/A;  . Amputation Right 08/27/2014    Procedure: Transmetatarsal Amputation;  Surgeon: Newt Minion, MD;  Location: [facility];  Service: Orthopedics;  Laterality: Right;  . Left heart catheterization with coronary angiogram N/A 10/29/2014    Procedure: LEFT HEART CATHETERIZATION WITH CORONARY ANGIOGRAM;  Surgeon: Laverda Page, MD;  Location: [facility] CATH LAB;  Service: Cardiovascular;  Laterality: N/A;  . Percutaneous coronary stent intervention (pci-s) Right 10/29/2014    Procedure: PERCUTANEOUS CORONARY STENT INTERVENTION (PCI-S);  Surgeon: Laverda Page, MD;  Location: [facility] CATH LAB;  Service: Cardiovascular;  Laterality: Right;  . Amputation Right 01/14/2015    Procedure: AMPUTATION BELOW KNEE;  Surgeon:  Newt Minion, MD;  Location: [facility];  Service: Orthopedics;  Laterality: Right;  . Carotid endarterectomy Left 11/10/2010    Subtotal occlusion of left internal carotid artery with left hemispheric transient ischemic attacks.    There were no vitals filed for this visit.  Visit Diagnosis:  Abnormality of gait  Unsteadiness  Balance problems  Decreased functional activity tolerance  Encounter for prosthetic gait training  Functional weakness     06/02/15 1419  Symptoms/Limitations  Subjective No new complaints. No falls or pain to report.   Pain Assessment  Currently in Pain? No/denies  Pain Score 0      06/02/15 1422  Ambulation/Gait  Ambulation/Gait Yes  Ambulation/Gait Assistance 5: Supervision  Ambulation/Gait Assistance Details cues on posture, step/stride length and cane use/sequence with gait  Ambulation Distance (Feet) 220 Feet (x 2; 150  x 2)  Assistive device Prosthesis;Straight cane  Gait Pattern Step-through pattern;Decreased step length - left;Decreased stance time - right;Decreased stride length;Decreased hip/knee flexion - right;Trunk flexed;Abducted- right;Poor foot clearance - right  Ambulation Surface Level;Indoor  Ramp 5: Supervision (with cane/prosthesis)  Ramp Details (indicate cue type and reason) cues on posture and sequence  Curb 5: Supervision (wtih cane/prosthesis)  Curb Details (indicate cue type and reason) cues on posture and sequence  Prosthetics  Current prosthetic wear tolerance (days/week)  daily  Current prosthetic wear tolerance (#hours/day)  4-5 hours 2 x day  Residual limb condition  wound healed, skin intact  Education Provided Residual limb care;Correct ply sock adjustment  Person(s) Educated Patient  Education Method Explanation  Education Method Verbalized understanding;Verbal cues required  Donning Prosthesis 5  Doffing Prosthesis 6          PT Short Term Goals - 05/12/15 1315    PT SHORT TERM GOAL #1   Title Patient  verbalizes proper prosthetic care with minimal cues for problem solving. (Target Date: 06/10/2015)   Time 4   Period Weeks   Status New   PT SHORT TERM GOAL #2   Title Patient tolerates >12 hrs/day without negative change in skin issues and no tenderness to limb.  (Target Date: 06/10/2015)   Time 4   Period Weeks   Status New   PT SHORT TERM GOAL #3   Title Patient ambulates 250' with single point cane & prosthesis with cues for gait deviations only no physical assist needed.  (Target Date: 06/10/2015)   Time 4   Period Weeks   Status New   PT SHORT TERM GOAL #4   Title Patient negotiates ramps, curbs & stairs (1 rail) with single point cane & prosthesis with supervision.  (Target Date: 06/10/2015)   Time 4   Period Weeks   Status New   PT SHORT TERM GOAL #5   Title Berg Balance >45/56  (Target Date: 06/10/2015)   Time 4   Period Weeks   Status New           PT Long Term Goals - 05/12/15 1315    PT LONG TERM GOAL #1   Title Patient verbalizes and demonstrates proper prosthetic care to enable safe use of prosthesis.  (Target Date: 07/08/2015)   Time 8   Period Weeks   Status New   PT LONG TERM GOAL #2   Title Patient tolerates wear of prosthesis >90% of awake hours with no skin issues or tenderness to enable functional use throughout his day.  (Target Date: 07/08/2015)   Time 8   Period Weeks   Status New   PT LONG TERM GOAL #3   Title Patient reports chronic pain similar to prior level with increase in pain </= 2 increments with standing & gait activities.  (Target Date: 07/08/2015)   Time 8   Period Weeks   Status New   PT LONG TERM GOAL #4   Title Patient self-reports >20% increase in functional status using FOTO.  (Target Date: 07/08/2015)   Time 8   Period Weeks   Status New   PT LONG TERM GOAL #5   Title Patient ambulates >500' including grass, ramps, curbs & stairs (1 rail) with single point cane or less and prosthesis modified independent.  (Target Date:  07/08/2015)   Time 8   Period Weeks   Status New   Additional Long Term Goals   Additional Long Term Goals Yes   PT LONG TERM  GOAL #6   Title Patient ambulates 100' with prosthesis only carrying 15# objects around furniture indpendently.  (Target Date: 07/08/2015)   Time 8   Period Weeks   Status New       06/02/15 1420  Plan  Clinical Impression Statement With cues for sock managment and multiple additions pt did achieve a good and comfortable fit during session. Pt making steady progress toward goals.  Pt will benefit from skilled therapeutic intervention in order to improve on the following deficits Abnormal gait;Decreased activity tolerance;Decreased balance;Decreased endurance;Decreased mobility;Decreased range of motion;Decreased knowledge of use of DME;Decreased strength;Postural dysfunction;Prosthetic Dependency  Rehab Potential Good  PT Frequency 2x / week  PT Duration 8 weeks  PT Treatment/Interventions ADLs/Self Care Home Management;DME Instruction;Gait training;Stair training;Functional mobility training;Therapeutic activities;Therapeutic exercise;Balance training;Neuromuscular re-education;Patient/family education;Prosthetic Training  PT Next Visit Plan Review prosthetic care, donning, etc, assess wound, gait with single UE support (cane vs single crutch), balance on foam/rocker board, dynamic gait balance  Consulted and Agree with Plan of Care Patient      Problem List Patient Active Problem List   Diagnosis Date Noted  . Sepsis 01/14/2015  . Opacity of lung on imaging study 01/14/2015  . Angina pectoris associated with type 2 diabetes mellitus 10/28/2014  . Wound infection 09/26/2014  . Diabetes mellitus type 2, controlled 08/25/2014  . Essential hypertension 08/25/2014  . Hyperlipidemia 08/25/2014  . 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 Foot ulcer, left 11/24/2012  . Chronic total occlusion of artery of the extremities  04/08/2012  . Cellulitis 03/19/2012  . PAD (peripheral artery disease) 02/26/2012  . Dizziness 02/26/2012  . Onychomycosis 02/01/2012  . Diabetes mellitus 11/01/2011  . Occlusion and stenosis of carotid artery without mention of cerebral infarction 06/12/2011  . Aftercare following surgery of the circulatory system, [place] 06/12/2011  . GERD (gastroesophageal reflux disease) 05/08/2011  . Barrett's esophagus without dysplasia 05/08/2011  . PVD (peripheral vascular disease) 02/01/2011  . HTN (hypertension) 02/01/2011  . Tobacco abuse 02/01/2011    Willow Ora 06/04/2015, 12:03 AM  Willow Ora, PTA, [facility] [address], [place], [place] [address] [phone] 06/04/2015, 12:03 AM

## 2015-06-06 ENCOUNTER — Ambulatory Visit: Payer: BLUE CROSS/BLUE SHIELD | Admitting: Physical Therapy

## 2015-06-09 ENCOUNTER — Encounter: Payer: Self-pay | Admitting: Physical Therapy

## 2015-06-09 ENCOUNTER — Ambulatory Visit: Payer: BLUE CROSS/BLUE SHIELD | Admitting: Physical Therapy

## 2015-06-09 DIAGNOSIS — R269 Unspecified abnormalities of gait and mobility: Secondary | ICD-10-CM

## 2015-06-09 DIAGNOSIS — R2689 Other abnormalities of gait and mobility: Secondary | ICD-10-CM

## 2015-06-09 DIAGNOSIS — R6889 Other general symptoms and signs: Secondary | ICD-10-CM | POA: Diagnosis not present

## 2015-06-09 DIAGNOSIS — R2681 Unsteadiness on feet: Secondary | ICD-10-CM

## 2015-06-09 DIAGNOSIS — R531 Weakness: Secondary | ICD-10-CM

## 2015-06-09 DIAGNOSIS — Z4789 Encounter for other orthopedic aftercare: Secondary | ICD-10-CM

## 2015-06-10 ENCOUNTER — Encounter: Payer: Self-pay | Admitting: Physical Therapy

## 2015-06-10 ENCOUNTER — Ambulatory Visit: Payer: BLUE CROSS/BLUE SHIELD | Admitting: Physical Therapy

## 2015-06-10 DIAGNOSIS — R2681 Unsteadiness on feet: Secondary | ICD-10-CM

## 2015-06-10 DIAGNOSIS — R269 Unspecified abnormalities of gait and mobility: Secondary | ICD-10-CM

## 2015-06-10 DIAGNOSIS — R2689 Other abnormalities of gait and mobility: Secondary | ICD-10-CM

## 2015-06-10 DIAGNOSIS — R6889 Other general symptoms and signs: Secondary | ICD-10-CM

## 2015-06-10 DIAGNOSIS — Z4789 Encounter for other orthopedic aftercare: Secondary | ICD-10-CM

## 2015-06-10 DIAGNOSIS — R531 Weakness: Secondary | ICD-10-CM

## 2015-06-10 NOTE — Therapy (Signed)
[facility] [address] [place], [place], [address] Phone: [phone]   Fax:  [phone]  Physical Therapy Treatment  Patient Details  Name: [name] MRN: [id number] Date of Birth: 12-12-1955 Referring Provider:  Anda Kraft, MD  Encounter Date: 06/10/2015      PT End of Session - 06/10/15 2301    Visit Number 8   Number of Visits 17   Date for PT Re-Evaluation 07/08/15   Authorization Type Medicare 2nd G-code every 10th visit   PT Start Time 1545  pt 15 minutes late due to transportation   PT Stop Time 1616   PT Time Calculation (min) 31 min   Equipment Utilized During Treatment Gait belt   Activity Tolerance Patient tolerated treatment well   Behavior During Therapy [facility] for tasks assessed/performed      Past Medical History  Diagnosis Date  . Carotid artery occlusion 11/10/10    LEFT CAROTID ENDARTERECTOMY  . Diabetes mellitus   . Hypertension   . Hyperlipidemia   . COPD (chronic obstructive pulmonary disease)   . DJD (degenerative joint disease)   . GERD (gastroesophageal reflux disease)   . Substernal chest pain   . 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 Slurred speech     AS PER WIFE IN D/C NOTE 11/10/10  . PAD (peripheral artery disease)     Distal aortogram June 2012. Atherectomy left popliteal artery July 2012.   . Barrett's esophagus   . Fatty liver   . Diverticulosis of colon (without mention of hemorrhage)   . Emphysema of lung   . Osteomyelitis   . Complication of anesthesia     BP WENT UP AT [facility] "  . Sleep apnea     has not used CPAP in e years - took self off  . H/O hiatal hernia   . Neuromuscular disorder     peripheral neuropathy  . Full dentures   . Wears glasses   . CHF (congestive heart failure)   . History of echocardiogram 08/22/2011    Mildly hypertrophic left ventricle with normal systolic function and mild diastolic dysfunction. mildly  dilated left atrium. no significant valvular abnormalities. no pericardial effusion no change from previous study 10/2010. the ejection fraction =>55%  . History of cardiovascular stress test 03/23/2011    ejection fraction measured 51%, with an end-diastolic volume of 644 ml and an end-systolic volume of 81 ml. no evidence of myocardial ischemia.  . H/O Doppler ultrasound 06/28/2012    Lower venous duplex left-- findings consistent with acute deep vein thrombosis involving the personal vein of the left lower extremity. No evidence of Baker's cyst on the left. Duplex scan of the left lower extremity arteries reveal adequate blood flow.   . History of Holter monitoring 02/14/2012    Scan quality is fair due to baseline artifact. Predominant rhythm is Sinus. Veentricular Ectopy was noted in single beat form. The total recording time was 24:00 hours. total beats =[id number]. the total time analyzed was 23:58 hours. Heart  Max = 102 at 1:18:22, Min= 52 at 9:56:19 am., Avg= 70. There were 0 pause(s) > 2.5 seconds. the longest pause = 0.00 sec. at 1:51 pm(1)  . OSA (obstructive sleep apnea) 12/26/08    AHI of 6.3/hr overall, 32.7/hr during REM sleep and an RDI of 24.8/hr and 50.9/hr.    Past Surgical History  Procedure Laterality Date  . Laminectomy  X 3 LUMBAR AND X 2 CERVICAL SPINE OPERATIONS  . Carotid endarterectomy  11/10/10  . Hernia repair      LEFT INGUINAL AND UMBILICAL REPAIRS  . Anterior fusion cervical spine  02/06/06    C4-5, C5-6, C6-7; SURGEON DR. MAX COHEN  . Penile prosthesis implant  08/14/05    INFRAPUBIC INSERTION OF INFLATABLE PENILE PROSTHESIS; SURGEON DR. Amalia Hailey  . Laparoscopic cholecystectomy w/ cholangiography  11/09/04    SURGEON DR. Luella Cook  . Total knee arthroplasty  07/2002    RIGHT KNEE ; SURGEON  DR. [name] ALSO HAD ARTHROSCOPIC RIGHT KNEE IN  10/2001  . Shoulder arthroscopy    . Cholecystectomy    . Amputation  11/05/2011    Procedure: AMPUTATION RAY;  Surgeon: Wylene Simmer, MD;  Location: [facility];  Service: Orthopedics;  Laterality: Right;  Amputation of Right 4&5th Toes  . Femoral artery stent      x6  . Amputation Left 11/26/2012    Procedure: AMPUTATION RAY;  Surgeon: Wylene Simmer, MD;  Location: [facility];  Service: Orthopedics;  Laterality: Left;  fourth ray amputation  . Cardiac catheterization  10/31/04    2009  . Carpal tunnel release Right 10/21/2013    Procedure: RIGHT CARPAL TUNNEL RELEASE;  Surgeon: Wynonia Sours, MD;  Location: [facility];  Service: Orthopedics;  Laterality: Right;  . Ulnar nerve transposition Right 10/21/2013    Procedure: RIGHT ELBOW  ULNAR NERVE DECOMPRESSION;  Surgeon: Wynonia Sours, MD;  Location: [facility];  Service: Orthopedics;  Laterality: Right;  . Joint replacement Right 2001    Total  . Spine surgery    . Neck surgery    . Esophageal manometry Bilateral 07/19/2014    Procedure: ESOPHAGEAL MANOMETRY (EM);  Surgeon: Jerene Bears, MD;  Location: [facility];  Service: Gastroenterology;  Laterality: Bilateral;  . Lower extremity angiogram N/A 03/19/2012    Procedure: LOWER EXTREMITY ANGIOGRAM;  Surgeon: Burnell Blanks, MD;  Location: [facility] CATH LAB;  Service: Cardiovascular;  Laterality: N/A;  . Amputation Right 08/27/2014    Procedure: Transmetatarsal Amputation;  Surgeon: Newt Minion, MD;  Location: [facility];  Service: Orthopedics;  Laterality: Right;  . Left heart catheterization with coronary angiogram N/A 10/29/2014    Procedure: LEFT HEART CATHETERIZATION WITH CORONARY ANGIOGRAM;  Surgeon: Laverda Page, MD;  Location: [facility] CATH LAB;  Service: Cardiovascular;  Laterality: N/A;  . Percutaneous coronary stent intervention (pci-s) Right 10/29/2014    Procedure: PERCUTANEOUS CORONARY STENT INTERVENTION (PCI-S);  Surgeon: Laverda Page, MD;  Location: [facility] CATH LAB;  Service: Cardiovascular;  Laterality: Right;  . Amputation Right 01/14/2015    Procedure: AMPUTATION BELOW KNEE;  Surgeon:  Newt Minion, MD;  Location: [facility];  Service: Orthopedics;  Laterality: Right;  . Carotid endarterectomy Left 11/10/2010    Subtotal occlusion of left internal carotid artery with left hemispheric transient ischemic attacks.    There were no vitals filed for this visit.  Visit Diagnosis:  Abnormality of gait  Unsteadiness  Balance problems  Decreased functional activity tolerance  Functional weakness  Encounter for prosthetic gait training      Subjective Assessment - 06/10/15 1546    Subjective No new complaints.  Up to 15 ply socks at some times. No falls.    Currently in Pain? Yes   Pain Score 4    Pain Location Leg   Pain Orientation Right;Anterior   Pain Descriptors / Indicators Sore   Pain  Type Acute pain   Pain Onset 1 to 4 weeks ago   Pain Frequency Intermittent   Aggravating Factors  poor fitting socket, not enought socks   Pain Relieving Factors increased sock use, removing prosthesis           [facility] Adult PT Treatment/Exercise - 06/10/15 1548    Transfers   Sit to Stand 5: Supervision;With upper extremity assist;From chair/3-in-1;With armrests;Without upper extremity assist   Stand to Sit 5: Supervision;With upper extremity assist;To chair/3-in-1;With armrests;Without upper extremity assist   Ambulation/Gait   Ambulation/Gait Yes   Ambulation/Gait Assistance 5: Supervision   Ambulation/Gait Assistance Details cues on posture, for equal step length and increased right stance/weight shift   Ambulation Distance (Feet) 220 Feet  x 2 reps   Assistive device Prosthesis;Straight cane   Gait Pattern Step-through pattern;Decreased step length - left;Decreased stance time - right;Decreased stride length;Decreased hip/knee flexion - right;Trunk flexed;Abducted- right;Poor foot clearance - right   Ambulation Surface Level;Indoor   Berg Balance Test   Sit to Stand Able to stand without using hands and stabilize independently   Standing Unsupported Able to stand  safely 2 minutes   Sitting with Back Unsupported but Feet Supported on Floor or Stool Able to sit safely and securely 2 minutes   Stand to Sit Sits safely with minimal use of hands   Transfers Able to transfer safely, minor use of hands   Standing Unsupported with Eyes Closed Able to stand 10 seconds safely   Standing Ubsupported with Feet Together Able to place feet together independently and stand for 1 minute with supervision   From Standing, Reach Forward with Outstretched Arm Can reach confidently >25 cm (10")  10 inches   From Standing Position, Pick up Object from Floor Able to pick up shoe, needs supervision   From Standing Position, Turn to Look Behind Over each Shoulder Looks behind one side only/other side shows less weight shift  right > left   Turn 360 Degrees Able to turn 360 degrees safely but slowly  >/= 5 seconds each way   Standing Unsupported, Alternately Place Feet on Step/Stool Able to complete >2 steps/needs minimal assist   Standing Unsupported, One Foot in Front Able to plae foot ahead of the other independently and hold 30 seconds   Standing on One Leg Able to lift leg independently and hold equal to or more than 3 seconds   Total Score 45   Prosthetics   Prosthetic Care Comments  pt with reports of pressure on the tibial area with gait. Decreased with use of cut off sock.    Current prosthetic wear tolerance (days/week)  daily   Current prosthetic wear tolerance (#hours/day)  6 hours 2 x week   Residual limb condition  intact with no issues   Education Provided Correct ply sock adjustment  cut off socks   Person(s) Educated Patient   Education Method Explanation;Demonstration   Education Method Verbalized understanding   Donning Prosthesis Modified independent (device/increased time)   Doffing Prosthesis Modified independent (device/increased time)             PT Short Term Goals - 06/10/15 2303    PT SHORT TERM GOAL #1   Title Patient verbalizes  proper prosthetic care with minimal cues for problem solving. (Target Date: 06/10/2015)   Status Achieved   PT SHORT TERM GOAL #2   Title Patient tolerates >12 hrs/day without negative change in skin issues and no tenderness to limb.  (Target Date: 06/10/2015)  Baseline 06/10/15: wearing up to 5 hours 2 x day   Status Not Met   PT SHORT TERM GOAL #3   Title Patient ambulates 250' with single point cane & prosthesis with cues for gait deviations only no physical assist needed.  (Target Date: 06/10/2015)   Baseline met before 2/30/16   Status Achieved   PT SHORT TERM GOAL #4   Title Patient negotiates ramps, curbs & stairs (1 rail) with single point cane & prosthesis with supervision.  (Target Date: 06/10/2015)   Baseline met before 06/10/15   Status Achieved   PT SHORT TERM GOAL #5   Title Berg Balance >45/56  (Target Date: 06/10/2015)   Baseline 45/56 on 06/10/15   Status Achieved           PT Long Term Goals - 05/12/15 1315    PT LONG TERM GOAL #1   Title Patient verbalizes and demonstrates proper prosthetic care to enable safe use of prosthesis.  (Target Date: 07/08/2015)   Time 8   Period Weeks   Status New   PT LONG TERM GOAL #2   Title Patient tolerates wear of prosthesis >90% of awake hours with no skin issues or tenderness to enable functional use throughout his day.  (Target Date: 07/08/2015)   Time 8   Period Weeks   Status New   PT LONG TERM GOAL #3   Title Patient reports chronic pain similar to prior level with increase in pain </= 2 increments with standing & gait activities.  (Target Date: 07/08/2015)   Time 8   Period Weeks   Status New   PT LONG TERM GOAL #4   Title Patient self-reports >20% increase in functional status using FOTO.  (Target Date: 07/08/2015)   Time 8   Period Weeks   Status New   PT LONG TERM GOAL #5   Title Patient ambulates >500' including grass, ramps, curbs & stairs (1 rail) with single point cane or less and prosthesis modified  independent.  (Target Date: 07/08/2015)   Time 8   Period Weeks   Status New   Additional Long Term Goals   Additional Long Term Goals Yes   PT LONG TERM GOAL #6   Title Patient ambulates 100' with prosthesis only carrying 15# objects around furniture indpendently.  (Target Date: 07/08/2015)   Time 8   Period Weeks   Status New      Problem List Patient Active Problem List   Diagnosis Date Noted  . Sepsis 01/14/2015  . Opacity of lung on imaging study 01/14/2015  . Angina pectoris associated with type 2 diabetes mellitus 10/28/2014  . Wound infection 09/26/2014  . Diabetes mellitus type 2, controlled 08/25/2014  . Essential hypertension 08/25/2014  . Hyperlipidemia 08/25/2014  . 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 Foot ulcer, left 11/24/2012  . Chronic total occlusion of artery of the extremities 04/08/2012  . Cellulitis 03/19/2012  . PAD (peripheral artery disease) 02/26/2012  . Dizziness 02/26/2012  . Onychomycosis 02/01/2012  . Diabetes mellitus 11/01/2011  . Occlusion and stenosis of carotid artery without mention of cerebral infarction 06/12/2011  . Aftercare following surgery of the circulatory system, [place] 06/12/2011  . GERD (gastroesophageal reflux disease) 05/08/2011  . Barrett's esophagus without dysplasia 05/08/2011  . PVD (peripheral vascular disease) 02/01/2011  . HTN (hypertension) 02/01/2011  . Tobacco abuse 02/01/2011    Willow Ora 06/10/2015, 11:04 PM  Willow Ora, PTA, [place] [address]  [address] [place], [place] [address] [phone] 06/10/2015, 11:04 PM

## 2015-06-10 NOTE — Therapy (Signed)
[facility] [address] [place], [place], [address] Phone: [phone]   Fax:  [phone]  Physical Therapy Treatment  Patient Details  Name: [name] MRN: [id number] Date of Birth: 1956/03/29 Referring Provider:  Anda Kraft, MD  Encounter Date: 06/09/2015      PT End of Session - 06/09/15 1409    Visit Number 7   Number of Visits 17   Date for PT Re-Evaluation 07/08/15   Authorization Type Medicare 2nd G-code every 10th visit   PT Start Time 1403   PT Stop Time 1445   PT Time Calculation (min) 42 min   Equipment Utilized During Treatment Gait belt   Activity Tolerance Patient tolerated treatment well   Behavior During Therapy [facility] for tasks assessed/performed      Past Medical History  Diagnosis Date  . Carotid artery occlusion 11/10/10    LEFT CAROTID ENDARTERECTOMY  . Diabetes mellitus   . Hypertension   . Hyperlipidemia   . COPD (chronic obstructive pulmonary disease)   . DJD (degenerative joint disease)   . GERD (gastroesophageal reflux disease)   . Substernal chest pain   . 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 Slurred speech     AS PER WIFE IN D/C NOTE 11/10/10  . PAD (peripheral artery disease)     Distal aortogram June 2012. Atherectomy left popliteal artery July 2012.   . Barrett's esophagus   . Fatty liver   . Diverticulosis of colon (without mention of hemorrhage)   . Emphysema of lung   . Osteomyelitis   . Complication of anesthesia     BP WENT UP AT [facility] "  . Sleep apnea     has not used CPAP in e years - took self off  . H/O hiatal hernia   . Neuromuscular disorder     peripheral neuropathy  . Full dentures   . Wears glasses   . CHF (congestive heart failure)   . History of echocardiogram 08/22/2011    Mildly hypertrophic left ventricle with normal systolic function and mild diastolic dysfunction. mildly dilated left atrium. no significant valvular  abnormalities. no pericardial effusion no change from previous study 10/2010. the ejection fraction =>55%  . History of cardiovascular stress test 03/23/2011    ejection fraction measured 51%, with an end-diastolic volume of 144 ml and an end-systolic volume of 81 ml. no evidence of myocardial ischemia.  . H/O Doppler ultrasound 06/28/2012    Lower venous duplex left-- findings consistent with acute deep vein thrombosis involving the personal vein of the left lower extremity. No evidence of Baker's cyst on the left. Duplex scan of the left lower extremity arteries reveal adequate blood flow.   . History of Holter monitoring 02/14/2012    Scan quality is fair due to baseline artifact. Predominant rhythm is Sinus. Veentricular Ectopy was noted in single beat form. The total recording time was 24:00 hours. total beats =[id number]. the total time analyzed was 23:58 hours. Heart  Max = 102 at 1:18:22, Min= 52 at 9:56:19 am., Avg= 70. There were 0 pause(s) > 2.5 seconds. the longest pause = 0.00 sec. at 1:51 pm(1)  . OSA (obstructive sleep apnea) 12/26/08    AHI of 6.3/hr overall, 32.7/hr during REM sleep and an RDI of 24.8/hr and 50.9/hr.    Past Surgical History  Procedure Laterality Date  . Laminectomy      X 3 LUMBAR AND X 2  CERVICAL SPINE OPERATIONS  . Carotid endarterectomy  11/10/10  . Hernia repair      LEFT INGUINAL AND UMBILICAL REPAIRS  . Anterior fusion cervical spine  02/06/06    C4-5, C5-6, C6-7; SURGEON DR. MAX COHEN  . Penile prosthesis implant  08/14/05    INFRAPUBIC INSERTION OF INFLATABLE PENILE PROSTHESIS; SURGEON DR. Amalia Hailey  . Laparoscopic cholecystectomy w/ cholangiography  11/09/04    SURGEON DR. Luella Cook  . Total knee arthroplasty  07/2002    RIGHT KNEE ; SURGEON  DR. [name] ALSO HAD ARTHROSCOPIC RIGHT KNEE IN  10/2001  . Shoulder arthroscopy    . Cholecystectomy    . Amputation  11/05/2011    Procedure: AMPUTATION RAY;  Surgeon: Wylene Simmer, MD;  Location: [place];  Service:  Orthopedics;  Laterality: Right;  Amputation of Right 4&5th Toes  . Femoral artery stent      x6  . Amputation Left 11/26/2012    Procedure: AMPUTATION RAY;  Surgeon: Wylene Simmer, MD;  Location: [facility];  Service: Orthopedics;  Laterality: Left;  fourth ray amputation  . Cardiac catheterization  10/31/04    2009  . Carpal tunnel release Right 10/21/2013    Procedure: RIGHT CARPAL TUNNEL RELEASE;  Surgeon: Wynonia Sours, MD;  Location: [facility];  Service: Orthopedics;  Laterality: Right;  . Ulnar nerve transposition Right 10/21/2013    Procedure: RIGHT ELBOW  ULNAR NERVE DECOMPRESSION;  Surgeon: Wynonia Sours, MD;  Location: [facility];  Service: Orthopedics;  Laterality: Right;  . Joint replacement Right 2001    Total  . Spine surgery    . Neck surgery    . Esophageal manometry Bilateral 07/19/2014    Procedure: ESOPHAGEAL MANOMETRY (EM);  Surgeon: Jerene Bears, MD;  Location: [facility];  Service: Gastroenterology;  Laterality: Bilateral;  . Lower extremity angiogram N/A 03/19/2012    Procedure: LOWER EXTREMITY ANGIOGRAM;  Surgeon: Burnell Blanks, MD;  Location: [facility] CATH LAB;  Service: Cardiovascular;  Laterality: N/A;  . Amputation Right 08/27/2014    Procedure: Transmetatarsal Amputation;  Surgeon: Newt Minion, MD;  Location: [facility];  Service: Orthopedics;  Laterality: Right;  . Left heart catheterization with coronary angiogram N/A 10/29/2014    Procedure: LEFT HEART CATHETERIZATION WITH CORONARY ANGIOGRAM;  Surgeon: Laverda Page, MD;  Location: [facility] C[facility] LAB;  Service: Cardiovascular;  Laterality: N/A;  . Percutaneous coronary stent intervention (pci-s) Right 10/29/2014    Procedure: PERCUTANEOUS CORONARY STENT INTERVENTION (PCI-S);  Surgeon: Laverda Page, MD;  Location: [facility] CATH LAB;  Service: Cardiovascular;  Laterality: Right;  . Amputation Right 01/14/2015    Procedure: AMPUTATION BELOW KNEE;  Surgeon: Newt Minion, MD;  Location: [facility];   Service: Orthopedics;  Laterality: Right;  . Carotid endarterectomy Left 11/10/2010    Subtotal occlusion of left internal carotid artery with left hemispheric transient ischemic attacks.    There were no vitals filed for this visit.  Visit Diagnosis:  Abnormality of gait  Unsteadiness  Balance problems  Decreased functional activity tolerance  Functional weakness  Encounter for prosthetic gait training      Subjective Assessment - 06/09/15 1407    Subjective Having increased pain/pressure on tibial area. No falls.    Currently in Pain? No/denies   Pain Score 0-No pain          [facility] Adult PT Treatment/Exercise - 06/09/15 1411    Transfers   Sit to Stand 5: Supervision;With upper extremity assist;From chair/3-in-1;With armrests  Stand to Sit 5: Supervision;With upper extremity assist;To chair/3-in-1;With armrests   Ambulation/Gait   Ambulation/Gait Yes   Ambulation/Gait Assistance 5: Supervision   Ambulation Distance (Feet) 220 Feet  x1; 455 ft x1;360 ft x1 indoor/outdoor    Assistive device Prosthesis;Straight cane   Gait Pattern Step-through pattern;Decreased step length - left;Decreased stance time - right;Decreased stride length;Decreased hip/knee flexion - right;Trunk flexed;Abducted- right;Poor foot clearance - right   Ambulation Surface Level;Indoor   Stairs Yes   Stairs Assistance 5: Supervision   Stairs Assistance Details (indicate cue type and reason) one rail/cane combo x 2 reps (1 with each rail) step to pattern; progressed to reciprocal pattern with rail/cane                           Stair Management Technique One rail Right;One rail Left;Step to pattern;Alternating pattern;Forwards;With cane   Number of Stairs 4  x ~10 reps   Ramp 5: Supervision   Curb 5: Supervision   Prosthetics   Prosthetic Care Comments  call placed and appointment set for Tuesday with prosthetist to have pads added to socked   Current prosthetic wear tolerance (days/week)  daily    Current prosthetic wear tolerance (#hours/day)  5-6 hours 2x day   Residual limb condition  intact with no issues   Education Provided Ply sock cleaning  adding of pads to socket   Donning Prosthesis Modified independent (device/increased time)   Doffing Prosthesis Modified independent (device/increased time)            PT Short Term Goals - 06/09/15 1412    PT SHORT TERM GOAL #1   Title Patient verbalizes proper prosthetic care with minimal cues for problem solving. (Target Date: 06/10/2015)   Status Achieved   PT SHORT TERM GOAL #2   Title Patient tolerates >12 hrs/day without negative change in skin issues and no tenderness to limb.  (Target Date: 06/10/2015)   Status Not Met   PT SHORT TERM GOAL #3   Title Patient ambulates 250' with single point cane & prosthesis with cues for gait deviations only no physical assist needed.  (Target Date: 06/10/2015)   Time --   Period --   Status Achieved   PT SHORT TERM GOAL #4   Title Patient negotiates ramps, curbs & stairs (1 rail) with single point cane & prosthesis with supervision.  (Target Date: 06/10/2015)   Time --   Period --   Status Achieved   PT SHORT TERM GOAL #5   Title Berg Balance >45/56  (Target Date: 06/10/2015)   Time 4   Period Weeks   Status On-going           PT Long Term Goals - 05/12/15 1315    PT LONG TERM GOAL #1   Title Patient verbalizes and demonstrates proper prosthetic care to enable safe use of prosthesis.  (Target Date: 07/08/2015)   Time 8   Period Weeks   Status New   PT LONG TERM GOAL #2   Title Patient tolerates wear of prosthesis >90% of awake hours with no skin issues or tenderness to enable functional use throughout his day.  (Target Date: 07/08/2015)   Time 8   Period Weeks   Status New   PT LONG TERM GOAL #3   Title Patient reports chronic pain similar to prior level with increase in pain </= 2 increments with standing & gait activities.  (Target Date: 07/08/2015)   Time 8  Period  Weeks   Status New   PT LONG TERM GOAL #4   Title Patient self-reports >20% increase in functional status using FOTO.  (Target Date: 07/08/2015)   Time 8   Period Weeks   Status New   PT LONG TERM GOAL #5   Title Patient ambulates >500' including grass, ramps, curbs & stairs (1 rail) with single point cane or less and prosthesis modified independent.  (Target Date: 07/08/2015)   Time 8   Period Weeks   Status New   Additional Long Term Goals   Additional Long Term Goals Yes   PT LONG TERM GOAL #6   Title Patient ambulates 100' with prosthesis only carrying 15# objects around furniture indpendently.  (Target Date: 07/08/2015)   Time 8   Period Weeks   Status New           Plan - 06/09/15 1409    Clinical Impression Statement Pt has met some STG's today, will check remaining goals next visit. Pt making steady progress toward goals.   Pt will benefit from skilled therapeutic intervention in order to improve on the following deficits Abnormal gait;Decreased activity tolerance;Decreased balance;Decreased endurance;Decreased mobility;Decreased range of motion;Decreased knowledge of use of DME;Decreased strength;Postural dysfunction;Prosthetic Dependency   Rehab Potential Good   PT Frequency 2x / week   PT Duration 8 weeks   PT Treatment/Interventions ADLs/Self Care Home Management;DME Instruction;Gait training;Stair training;Functional mobility training;Therapeutic activities;Therapeutic exercise;Balance training;Neuromuscular re-education;Patient/family education;Prosthetic Training   PT Next Visit Plan check remaining STG's;continue with gait with cane, balance and strengthening   Consulted and Agree with Plan of Care Patient        Problem List Patient Active Problem List   Diagnosis Date Noted  . Sepsis 01/14/2015  . Opacity of lung on imaging study 01/14/2015  . Angina pectoris associated with type 2 diabetes mellitus 10/28/2014  . Wound infection 09/26/2014  . Diabetes  mellitus type 2, controlled 08/25/2014  . Essential hypertension 08/25/2014  . Hyperlipidemia 08/25/2014  . 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 Foot ulcer, left 11/24/2012  . Chronic total occlusion of artery of the extremities 04/08/2012  . Cellulitis 03/19/2012  . PAD (peripheral artery disease) 02/26/2012  . Dizziness 02/26/2012  . Onychomycosis 02/01/2012  . Diabetes mellitus 11/01/2011  . Occlusion and stenosis of carotid artery without mention of cerebral infarction 06/12/2011  . Aftercare following surgery of the circulatory system, [place] 06/12/2011  . GERD (gastroesophageal reflux disease) 05/08/2011  . Barrett's esophagus without dysplasia 05/08/2011  . PVD (peripheral vascular disease) 02/01/2011  . HTN (hypertension) 02/01/2011  . Tobacco abuse 02/01/2011    Willow Ora 06/10/2015, 11:01 AM  Willow Ora, PTA, [facility] [address], [place], [place] [address] [phone] 06/10/2015, 11:01 AM

## 2015-06-13 ENCOUNTER — Encounter: Payer: Self-pay | Admitting: Physical Therapy

## 2015-06-13 ENCOUNTER — Ambulatory Visit: Payer: BLUE CROSS/BLUE SHIELD | Attending: Emergency Medicine | Admitting: Physical Therapy

## 2015-06-13 DIAGNOSIS — R2681 Unsteadiness on feet: Secondary | ICD-10-CM | POA: Insufficient documentation

## 2015-06-13 DIAGNOSIS — R531 Weakness: Secondary | ICD-10-CM | POA: Diagnosis present

## 2015-06-13 DIAGNOSIS — Z4789 Encounter for other orthopedic aftercare: Secondary | ICD-10-CM

## 2015-06-13 DIAGNOSIS — R52 Pain, unspecified: Secondary | ICD-10-CM | POA: Insufficient documentation

## 2015-06-13 DIAGNOSIS — R6889 Other general symptoms and signs: Secondary | ICD-10-CM | POA: Diagnosis present

## 2015-06-13 DIAGNOSIS — Z5189 Encounter for other specified aftercare: Secondary | ICD-10-CM | POA: Diagnosis present

## 2015-06-13 DIAGNOSIS — R2689 Other abnormalities of gait and mobility: Secondary | ICD-10-CM

## 2015-06-13 DIAGNOSIS — R269 Unspecified abnormalities of gait and mobility: Secondary | ICD-10-CM | POA: Diagnosis not present

## 2015-06-13 DIAGNOSIS — R29818 Other symptoms and signs involving the nervous system: Secondary | ICD-10-CM | POA: Insufficient documentation

## 2015-06-13 DIAGNOSIS — Z89511 Acquired absence of right leg below knee: Secondary | ICD-10-CM | POA: Insufficient documentation

## 2015-06-13 NOTE — Therapy (Signed)
[facility] [address] [place], [place], [address] Phone: [phone]   Fax:  [phone]  Physical Therapy Treatment  Patient Details  Name: [name] MRN: [id number] Date of Birth: [birth year] Referring Provider:  Anda Kraft, MD  Encounter Date: 06/13/2015      PT End of Session - 06/13/15 1315    Visit Number 9   Number of Visits 17   Date for PT Re-Evaluation 07/08/15   Authorization Type Medicare 2nd G-code every 10th visit   PT Start Time 1315   PT Stop Time 1400   PT Time Calculation (min) 45 min   Equipment Utilized During Treatment Gait belt   Activity Tolerance Patient tolerated treatment well   Behavior During Therapy [facility] for tasks assessed/performed      Past Medical History  Diagnosis Date  . Carotid artery occlusion 11/10/10    LEFT CAROTID ENDARTERECTOMY  . Diabetes mellitus   . Hypertension   . Hyperlipidemia   . COPD (chronic obstructive pulmonary disease) ([place])   . DJD (degenerative joint disease)   . GERD (gastroesophageal reflux disease)   . Substernal chest pain   . 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 Slurred speech     AS PER WIFE IN D/C NOTE 11/10/10  . PAD (peripheral artery disease) ([place])     Distal aortogram June 2012. Atherectomy left popliteal artery July 2012.   . Barrett's esophagus   . Fatty liver   . Diverticulosis of colon (without mention of hemorrhage)   . Emphysema of lung ([place])   . Osteomyelitis ([place])   . Complication of anesthesia     BP WENT UP AT [facility] "  . Sleep apnea     has not used CPAP in e years - took self off  . H/O hiatal hernia   . Neuromuscular disorder ([place])     peripheral neuropathy  . Full dentures   . Wears glasses   . CHF (congestive heart failure) ([place])   . History of echocardiogram 08/22/2011    Mildly hypertrophic left ventricle with normal systolic function and mild diastolic dysfunction. mildly dilated left  atrium. no significant valvular abnormalities. no pericardial effusion no change from previous study 10/2010. the ejection fraction =>55%  . History of cardiovascular stress test 03/23/2011    ejection fraction measured 51%, with an end-diastolic volume of 379 ml and an end-systolic volume of 81 ml. no evidence of myocardial ischemia.  . H/O Doppler ultrasound 06/28/2012    Lower venous duplex left-- findings consistent with acute deep vein thrombosis involving the personal vein of the left lower extremity. No evidence of Baker's cyst on the left. Duplex scan of the left lower extremity arteries reveal adequate blood flow.   . History of Holter monitoring 02/14/2012    Scan quality is fair due to baseline artifact. Predominant rhythm is Sinus. Veentricular Ectopy was noted in single beat form. The total recording time was 24:00 hours. total beats =[id number]. the total time analyzed was 23:58 hours. Heart  Max = 102 at 1:18:22, Min= 52 at 9:56:19 am., Avg= 70. There were 0 pause(s) > 2.5 seconds. the longest pause = 0.00 sec. at 1:51 pm(1)  . OSA (obstructive sleep apnea) 12/26/08    AHI of 6.3/hr overall, 32.7/hr during REM sleep and an RDI of 24.8/hr and 50.9/hr.    Past Surgical History  Procedure Laterality Date  . Laminectomy  X 3 LUMBAR AND X 2 CERVICAL SPINE OPERATIONS  . Carotid endarterectomy  11/10/10  . Hernia repair      LEFT INGUINAL AND UMBILICAL REPAIRS  . Anterior fusion cervical spine  02/06/06    C4-5, C5-6, C6-7; SURGEON DR. MAX COHEN  . Penile prosthesis implant  08/14/05    INFRAPUBIC INSERTION OF INFLATABLE PENILE PROSTHESIS; SURGEON DR. Amalia Hailey  . Laparoscopic cholecystectomy w/ cholangiography  11/09/04    SURGEON DR. Luella Cook  . Total knee arthroplasty  07/2002    RIGHT KNEE ; SURGEON  DR. [name] ALSO HAD ARTHROSCOPIC RIGHT KNEE IN  10/2001  . Shoulder arthroscopy    . Cholecystectomy    . Amputation  11/05/2011    Procedure: AMPUTATION RAY;  Surgeon: Wylene Simmer, MD;   Location: [facility];  Service: Orthopedics;  Laterality: Right;  Amputation of Right 4&5th Toes  . Femoral artery stent      x6  . Amputation Left 11/26/2012    Procedure: AMPUTATION RAY;  Surgeon: Wylene Simmer, MD;  Location: [facility];  Service: Orthopedics;  Laterality: Left;  fourth ray amputation  . Cardiac catheterization  10/31/04    2009  . Carpal tunnel release Right 10/21/2013    Procedure: RIGHT CARPAL TUNNEL RELEASE;  Surgeon: Wynonia Sours, MD;  Location: [facility];  Service: Orthopedics;  Laterality: Right;  . Ulnar nerve transposition Right 10/21/2013    Procedure: RIGHT ELBOW  ULNAR NERVE DECOMPRESSION;  Surgeon: Wynonia Sours, MD;  Location: [facility];  Service: Orthopedics;  Laterality: Right;  . Joint replacement Right 2001    Total  . Spine surgery    . Neck surgery    . Esophageal manometry Bilateral 07/19/2014    Procedure: ESOPHAGEAL MANOMETRY (EM);  Surgeon: Jerene Bears, MD;  Location: [facility];  Service: Gastroenterology;  Laterality: Bilateral;  . Lower extremity angiogram N/A 03/19/2012    Procedure: LOWER EXTREMITY ANGIOGRAM;  Surgeon: Burnell Blanks, MD;  Location: [facility] CATH LAB;  Service: Cardiovascular;  Laterality: N/A;  . Amputation Right 08/27/2014    Procedure: Transmetatarsal Amputation;  Surgeon: Newt Minion, MD;  Location: [facility];  Service: Orthopedics;  Laterality: Right;  . Left heart catheterization with coronary angiogram N/A 10/29/2014    Procedure: LEFT HEART CATHETERIZATION WITH CORONARY ANGIOGRAM;  Surgeon: Laverda Page, MD;  Location: [facility] CATH LAB;  Service: Cardiovascular;  Laterality: N/A;  . Percutaneous coronary stent intervention (pci-s) Right 10/29/2014    Procedure: PERCUTANEOUS CORONARY STENT INTERVENTION (PCI-S);  Surgeon: Laverda Page, MD;  Location: [facility] CATH LAB;  Service: Cardiovascular;  Laterality: Right;  . Amputation Right 01/14/2015    Procedure: AMPUTATION BELOW KNEE;  Surgeon: Newt Minion, MD;   Location: [place];  Service: Orthopedics;  Laterality: Right;  . Carotid endarterectomy Left 11/10/2010    Subtotal occlusion of left internal carotid artery with left hemispheric transient ischemic attacks.    There were no vitals filed for this visit.  Visit Diagnosis:  Abnormality of gait  Unsteadiness  Balance problems  Decreased functional activity tolerance  Functional weakness  Encounter for prosthetic gait training  Status post below knee amputation of right lower extremity (HCC)  Pain, generalized      Subjective Assessment - 06/13/15 1234    Subjective He is wearing prosthesis most of awake hours but front of leg still hurts some times. He is wearing 16 ply fit. He has appt with prosthetist is tommorrow at 1:00.  Currently in Pain? Yes   Pain Score 5   0/10 sitting with knee extended with prosthesis on,   Pain Location Leg   Pain Orientation Right;Anterior   Pain Descriptors / Indicators Sore   Pain Type Acute pain   Pain Onset 1 to 4 weeks ago   Pain Frequency Intermittent                         [facility] Adult PT Treatment/Exercise - 06/13/15 1230    Transfers   Sit to Stand 6: Modified independent (Device/Increase time);With upper extremity assist;With armrests;From chair/3-in-1   Stand to Sit 6: Modified independent (Device/Increase time);With upper extremity assist;With armrests;To chair/3-in-1   Ambulation/Gait   Ambulation/Gait Yes   Ambulation/Gait Assistance 5: Supervision   Ambulation/Gait Assistance Details cues on step width, step length, posture   Ambulation Distance (Feet) 500 Feet  500' X 2 with cane, 50' X 2 without device   Assistive device Prosthesis;Straight cane   Gait Pattern Step-through pattern;Decreased step length - left;Decreased stance time - right;Decreased stride length;Decreased hip/knee flexion - right;Trunk flexed;Abducted- right;Poor foot clearance - right   Ambulation Surface  Indoor;Level;Outdoor;Unlevel;Paved;Gravel;Grass   Ramp 5: Supervision  cane & prosthesis   Curb 5: Supervision  cane & prosthesis   Berg Balance Test   Sit to Stand --   Standing Unsupported --   Sitting with Back Unsupported but Feet Supported on Floor or Stool --   Stand to Sit --   Transfers --   Standing Unsupported with Eyes Closed --   Standing Ubsupported with Feet Together --   From Standing, Reach Forward with Outstretched Arm --   From Standing Position, Pick up Object from Floor --   From Standing Position, Turn to Look Behind Over each Shoulder --   Turn 360 Degrees --   Standing Unsupported, Alternately Place Feet on Step/Stool --   Standing Unsupported, One Foot in Front --   Standing on One Leg --   Total Score --   High Level Balance   High Level Balance Activities Side stepping;Backward walking;Turns;Figure 8 turns;Negotitating around obstacles;Negotiating over obstacles   High Level Balance Comments PT demo, cued on technique with prosthesis   Self-Care   Self-Care Other Self-Care Comments  options for returning to driving with right LE amputation   Other Self-Care Comments  Options of left foot accelerator, using prosthesis with knee motion compared to ankle motion, 2 foot driving; Requirement at [facility] to pass behind the wheel test to renew liscense.   Therapeutic Activites    Therapeutic Activities ADL's;Lifting   ADL's wt shift between feet for mopping, sweeping, vacuuming; pt performed push /pull activity with PT with cues.   Lifting pt lifts 15# box and light weight objects with verbal cues after demo   Exercises   Exercises Other Exercises  Red Theraband standing resistance to improve balance & LE WB   Other Exercises  pulling, reaching forward and overhead 10 reps reciprocal & 10 reps BUE with verbal cues for posture   Prosthetics   Prosthetic Care Comments  Pt scheduled to see prosthetist tomorrow, When pads are added the ply socks will need to be  decreased.    Current prosthetic wear tolerance (days/week)  daily   Current prosthetic wear tolerance (#hours/day)  11 hours straight drying prn   Residual limb condition  intact with no issues   Education Provided Correct ply sock adjustment;Proper wear schedule/adjustment  cut off socks   Person(s) Educated  Patient   Education Method Explanation;Verbal cues   Education Method Verbalized understanding   Donning Prosthesis Modified independent (device/increased time)   Doffing Prosthesis Modified independent (device/increased time)                  PT Short Term Goals - 06/13/15 1315    PT SHORT TERM GOAL #1   Title Patient verbalizes proper prosthetic care with minimal cues for problem solving. (Target Date: 06/10/2015)   Status Achieved   PT SHORT TERM GOAL #2   Title Patient tolerates >12 hrs/day without negative change in skin issues and no tenderness to limb.  (Target Date: 06/10/2015)   Baseline 06/13/2015 Patient reports wear 11hours /day over the weekend with no skin issues but 5/10 limb pain. He is scheduled to see prosthetist 06/14/15 for adjustments.   Status Not Met   PT SHORT TERM GOAL #3   Title Patient ambulates 250' with single point cane & prosthesis with cues for gait deviations only no physical assist needed.  (Target Date: 06/10/2015)   Baseline met before 2/30/16   Status Achieved   PT SHORT TERM GOAL #4   Title Patient negotiates ramps, curbs & stairs (1 rail) with single point cane & prosthesis with supervision.  (Target Date: 06/10/2015)   Baseline met before 06/10/15   Status Achieved   PT SHORT TERM GOAL #5   Title Berg Balance >45/56  (Target Date: 06/10/2015)   Baseline 45/56 on 06/10/15   Status Achieved           PT Long Term Goals - 05/12/15 1315    PT LONG TERM GOAL #1   Title Patient verbalizes and demonstrates proper prosthetic care to enable safe use of prosthesis.  (Target Date: 07/08/2015)   Time 8   Period Weeks   Status New   PT  LONG TERM GOAL #2   Title Patient tolerates wear of prosthesis >90% of awake hours with no skin issues or tenderness to enable functional use throughout his day.  (Target Date: 07/08/2015)   Time 8   Period Weeks   Status New   PT LONG TERM GOAL #3   Title Patient reports chronic pain similar to prior level with increase in pain </= 2 increments with standing & gait activities.  (Target Date: 07/08/2015)   Time 8   Period Weeks   Status New   PT LONG TERM GOAL #4   Title Patient self-reports >20% increase in functional status using FOTO.  (Target Date: 07/08/2015)   Time 8   Period Weeks   Status New   PT LONG TERM GOAL #5   Title Patient ambulates >500' including grass, ramps, curbs & stairs (1 rail) with single point cane or less and prosthesis modified independent.  (Target Date: 07/08/2015)   Time 8   Period Weeks   Status New   Additional Long Term Goals   Additional Long Term Goals Yes   PT LONG TERM GOAL #6   Title Patient ambulates 100' with prosthesis only carrying 15# objects around furniture indpendently.  (Target Date: 07/08/2015)   Time 8   Period Weeks   Status New               Plan - 06/13/15 1315    Clinical Impression Statement Patient improved mobility with cane & prosthesis including negotiating around & over obstacles with instruction. Patient's balance improved with more equal weight bearing between LEs.    Pt will benefit from skilled therapeutic intervention in  order to improve on the following deficits Abnormal gait;Decreased activity tolerance;Decreased balance;Decreased endurance;Decreased mobility;Decreased range of motion;Decreased knowledge of use of DME;Decreased strength;Postural dysfunction;Prosthetic Dependency   Rehab Potential Good   PT Frequency 2x / week   PT Duration 8 weeks   PT Treatment/Interventions ADLs/Self Care Home Management;DME Instruction;Gait training;Stair training;Functional mobility training;Therapeutic  activities;Therapeutic exercise;Balance training;Neuromuscular re-education;Patient/family education;Prosthetic Training   PT Next Visit Plan Do G-code and progress report;continue with gait with cane, balance and strengthening   Consulted and Agree with Plan of Care Patient        Problem List Patient Active Problem List   Diagnosis Date Noted  . Sepsis ([place]) 01/14/2015  . Opacity of lung on imaging study 01/14/2015  . Angina pectoris associated with type 2 diabetes mellitus ([place]) 10/28/2014  . Wound infection ([place]) 09/26/2014  . Diabetes mellitus type 2, controlled ([place]) 08/25/2014  . Essential hypertension 08/25/2014  . Hyperlipidemia 08/25/2014  . 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 Foot ulcer, left ([place]) 11/24/2012  . Chronic total occlusion of artery of the extremities ([place]) 04/08/2012  . Cellulitis 03/19/2012  . PAD (peripheral artery disease) ([place]) 02/26/2012  . Dizziness 02/26/2012  . Onychomycosis 02/01/2012  . Diabetes mellitus ([place]) 11/01/2011  . Occlusion and stenosis of carotid artery without mention of cerebral infarction 06/12/2011  . Aftercare following surgery of the circulatory system, [place] 06/12/2011  . GERD (gastroesophageal reflux disease) 05/08/2011  . Barrett's esophagus without dysplasia 05/08/2011  . PVD (peripheral vascular disease) ([place]) 02/01/2011  . HTN (hypertension) 02/01/2011  . Tobacco abuse 02/01/2011    Jamey Reas PT, DPT 06/13/2015, 1:41 PM  [facility] [address] [place], [place], [address] Phone: [phone]   Fax:  [phone]

## 2015-06-15 ENCOUNTER — Ambulatory Visit: Payer: BLUE CROSS/BLUE SHIELD | Admitting: Physical Therapy

## 2015-06-15 DIAGNOSIS — R531 Weakness: Secondary | ICD-10-CM

## 2015-06-15 DIAGNOSIS — R2689 Other abnormalities of gait and mobility: Secondary | ICD-10-CM

## 2015-06-15 DIAGNOSIS — R6889 Other general symptoms and signs: Secondary | ICD-10-CM

## 2015-06-15 DIAGNOSIS — Z4789 Encounter for other orthopedic aftercare: Secondary | ICD-10-CM

## 2015-06-15 DIAGNOSIS — R269 Unspecified abnormalities of gait and mobility: Secondary | ICD-10-CM | POA: Diagnosis not present

## 2015-06-15 DIAGNOSIS — R2681 Unsteadiness on feet: Secondary | ICD-10-CM

## 2015-06-15 NOTE — Therapy (Signed)
[facility] [address] [place], [place], [address] Phone: [phone]   Fax:  [phone]  Physical Therapy Treatment  Patient Details  Name: [name] MRN: [id number] Date of Birth: 09-09-1956 Referring Provider:  Anda Kraft, MD  Encounter Date: 06/15/2015      PT End of Session - 06/15/15 1539    Visit Number 10   Number of Visits 17   Date for PT Re-Evaluation 07/08/15   Authorization Type Medicare 2nd G-code every 10th visit   PT Start Time 1535   PT Stop Time 1615   PT Time Calculation (min) 40 min   Equipment Utilized During Treatment Gait belt   Activity Tolerance Patient tolerated treatment well   Behavior During Therapy [facility] for tasks assessed/performed      Past Medical History  Diagnosis Date  . Carotid artery occlusion 11/10/10    LEFT CAROTID ENDARTERECTOMY  . Diabetes mellitus   . Hypertension   . Hyperlipidemia   . COPD (chronic obstructive pulmonary disease) ([place])   . DJD (degenerative joint disease)   . GERD (gastroesophageal reflux disease)   . Substernal chest pain   . 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 Slurred speech     AS PER WIFE IN D/C NOTE 11/10/10  . PAD (peripheral artery disease) ([place])     Distal aortogram June 2012. Atherectomy left popliteal artery July 2012.   . Barrett's esophagus   . Fatty liver   . Diverticulosis of colon (without mention of hemorrhage)   . Emphysema of lung ([place])   . Osteomyelitis ([place])   . Complication of anesthesia     BP WENT UP AT [facility] "  . Sleep apnea     has not used CPAP in e years - took self off  . H/O hiatal hernia   . Neuromuscular disorder ([place])     peripheral neuropathy  . Full dentures   . Wears glasses   . CHF (congestive heart failure) ([place])   . History of echocardiogram 08/22/2011    Mildly hypertrophic left ventricle with normal systolic function and mild diastolic dysfunction. mildly dilated  left atrium. no significant valvular abnormalities. no pericardial effusion no change from previous study 10/2010. the ejection fraction =>55%  . History of cardiovascular stress test 03/23/2011    ejection fraction measured 51%, with an end-diastolic volume of 673 ml and an end-systolic volume of 81 ml. no evidence of myocardial ischemia.  . H/O Doppler ultrasound 06/28/2012    Lower venous duplex left-- findings consistent with acute deep vein thrombosis involving the personal vein of the left lower extremity. No evidence of Baker's cyst on the left. Duplex scan of the left lower extremity arteries reveal adequate blood flow.   . History of Holter monitoring 02/14/2012    Scan quality is fair due to baseline artifact. Predominant rhythm is Sinus. Veentricular Ectopy was noted in single beat form. The total recording time was 24:00 hours. total beats =[id number]. the total time analyzed was 23:58 hours. Heart  Max = 102 at 1:18:22, Min= 52 at 9:56:19 am., Avg= 70. There were 0 pause(s) > 2.5 seconds. the longest pause = 0.00 sec. at 1:51 pm(1)  . OSA (obstructive sleep apnea) 12/26/08    AHI of 6.3/hr overall, 32.7/hr during REM sleep and an RDI of 24.8/hr and 50.9/hr.    Past Surgical History  Procedure Laterality Date  . Laminectomy  X 3 LUMBAR AND X 2 CERVICAL SPINE OPERATIONS  . Carotid endarterectomy  11/10/10  . Hernia repair      LEFT INGUINAL AND UMBILICAL REPAIRS  . Anterior fusion cervical spine  02/06/06    C4-5, C5-6, C6-7; SURGEON DR. MAX COHEN  . Penile prosthesis implant  08/14/05    INFRAPUBIC INSERTION OF INFLATABLE PENILE PROSTHESIS; SURGEON DR. Amalia Hailey  . Laparoscopic cholecystectomy w/ cholangiography  11/09/04    SURGEON DR. Luella Cook  . Total knee arthroplasty  07/2002    RIGHT KNEE ; SURGEON  DR. [name] ALSO HAD ARTHROSCOPIC RIGHT KNEE IN  10/2001  . Shoulder arthroscopy    . Cholecystectomy    . Amputation  11/05/2011    Procedure: AMPUTATION RAY;  Surgeon: Wylene Simmer,  MD;  Location: [facility];  Service: Orthopedics;  Laterality: Right;  Amputation of Right 4&5th Toes  . Femoral artery stent      x6  . Amputation Left 11/26/2012    Procedure: AMPUTATION RAY;  Surgeon: Wylene Simmer, MD;  Location: [facility];  Service: Orthopedics;  Laterality: Left;  fourth ray amputation  . Cardiac catheterization  10/31/04    2009  . Carpal tunnel release Right 10/21/2013    Procedure: RIGHT CARPAL TUNNEL RELEASE;  Surgeon: Wynonia Sours, MD;  Location: [facility];  Service: Orthopedics;  Laterality: Right;  . Ulnar nerve transposition Right 10/21/2013    Procedure: RIGHT ELBOW  ULNAR NERVE DECOMPRESSION;  Surgeon: Wynonia Sours, MD;  Location: [facility];  Service: Orthopedics;  Laterality: Right;  . Joint replacement Right 2001    Total  . Spine surgery    . Neck surgery    . Esophageal manometry Bilateral 07/19/2014    Procedure: ESOPHAGEAL MANOMETRY (EM);  Surgeon: Jerene Bears, MD;  Location: [facility];  Service: Gastroenterology;  Laterality: Bilateral;  . Lower extremity angiogram N/A 03/19/2012    Procedure: LOWER EXTREMITY ANGIOGRAM;  Surgeon: Burnell Blanks, MD;  Location: [facility] CATH LAB;  Service: Cardiovascular;  Laterality: N/A;  . Amputation Right 08/27/2014    Procedure: Transmetatarsal Amputation;  Surgeon: Newt Minion, MD;  Location: [facility];  Service: Orthopedics;  Laterality: Right;  . Left heart catheterization with coronary angiogram N/A 10/29/2014    Procedure: LEFT HEART CATHETERIZATION WITH CORONARY ANGIOGRAM;  Surgeon: Laverda Page, MD;  Location: [facility] C[facility] LAB;  Service: Cardiovascular;  Laterality: N/A;  . Percutaneous coronary stent intervention (pci-s) Right 10/29/2014    Procedure: PERCUTANEOUS CORONARY STENT INTERVENTION (PCI-S);  Surgeon: Laverda Page, MD;  Location: [facility] CATH LAB;  Service: Cardiovascular;  Laterality: Right;  . Amputation Right 01/14/2015    Procedure: AMPUTATION BELOW KNEE;  Surgeon: Newt Minion, MD;  Location: [facility];  Service: Orthopedics;  Laterality: Right;  . Carotid endarterectomy Left 11/10/2010    Subtotal occlusion of left internal carotid artery with left hemispheric transient ischemic attacks.    There were no vitals filed for this visit.  Visit Diagnosis:  Abnormality of gait  Unsteadiness  Balance problems  Decreased functional activity tolerance  Functional weakness  Encounter for prosthetic gait training      Subjective Assessment - 06/15/15 1538    Subjective No new complaints. No falls. Pain is better after having tibial pads added to socket on tuesday.    Currently in Pain? No/denies   Pain Score 0-No pain           [facility] Adult PT Treatment/Exercise - 06/15/15 1540  Transfers   Sit to Stand 6: Modified independent (Device/Increase time);With upper extremity assist;With armrests;From chair/3-in-1   Stand to Sit 6: Modified independent (Device/Increase time);With upper extremity assist;With armrests;To chair/3-in-1   Ambulation/Gait   Ambulation/Gait Yes   Ambulation/Gait Assistance 5: Supervision;4: Min guard   Ambulation/Gait Assistance Details occasional cues on prosthetic foot placement with descending stairs reciprocally   Ambulation Distance (Feet) 500 Feet  x1 outdoor/indoor with cane;   Assistive device Prosthesis;Straight cane   Gait Pattern Step-through pattern;Decreased step length - left;Decreased stance time - right;Decreased stride length;Decreased hip/knee flexion - right;Trunk flexed;Abducted- right;Poor foot clearance - right   Ambulation Surface Level;Unlevel;Indoor;Outdoor;Paved;Other (comment)  pine needles   Stairs Yes   Stairs Assistance 5: Supervision   Stairs Assistance Details (indicate cue type and reason) occasional cues on prosthetic foot placement with descending stairs   Stair Management Technique One rail Right;One rail Left;Alternating pattern;Forwards;With cane   Number of Stairs 4  x 3 reps   Ramp 5:  Supervision  with cane/prosthesis x 3 reps   Ramp Details (indicate cue type and reason) cues on technique   Curb 5: Supervision  outdoor x 1 with cane/prosthesis; indoor 6 inch curb x 3 rep   Curb Details (indicate cue type and reason) cues on sequence and foot placement   Dynamic Standing Balance   Dynamic Standing - Balance Support No upper extremity supported;During functional activity   Dynamic Standing - Level of Assistance 4: Min assist   Dynamic Standing - Balance Activities Rocker board   Rocker board comments: fwd/bwd directions, eyes open with head nods/shakes x 10 each. min guard assist to min assist for balance with intermittent UE touch to parallel bars.   High Level Balance   High Level Balance Activities Marching forwards;Marching backwards;Negotiating over obstacles;Side stepping;Tandem walking;Negotitating around obstacles   Prosthetics   Prosthetic Care Comments  Has pads in socket, reported pain gone unless he wears prosthesis for a long time straight.                   Current prosthetic wear tolerance (days/week)  daily   Current prosthetic wear tolerance (#hours/day)  11 hours straight drying prn   Residual limb condition  intact with no issues   Education Provided Correct ply sock adjustment;Proper weight-bearing schedule/adjustment;Proper wear schedule/adjustment   Person(s) Educated Patient   Education Method Explanation   Education Method Verbalized understanding   Donning Prosthesis Modified independent (device/increased time)   Doffing Prosthesis Modified independent (device/increased time)           PT Short Term Goals - 06/13/15 1315    PT SHORT TERM GOAL #1   Title Patient verbalizes proper prosthetic care with minimal cues for problem solving. (Target Date: 06/10/2015)   Status Achieved   PT SHORT TERM GOAL #2   Title Patient tolerates >12 hrs/day without negative change in skin issues and no tenderness to limb.  (Target Date: 06/10/2015)   Baseline  06/13/2015 Patient reports wear 11hours /day over the weekend with no skin issues but 5/10 limb pain. He is scheduled to see prosthetist 06/14/15 for adjustments.   Status Not Met   PT SHORT TERM GOAL #3   Title Patient ambulates 250' with single point cane & prosthesis with cues for gait deviations only no physical assist needed.  (Target Date: 06/10/2015)   Baseline met before 2/30/16   Status Achieved   PT SHORT TERM GOAL #4   Title Patient negotiates ramps, curbs & stairs (1 rail) with single  point cane & prosthesis with supervision.  (Target Date: 06/10/2015)   Baseline met before 06/10/15   Status Achieved   PT SHORT TERM GOAL #5   Title Berg Balance >45/56  (Target Date: 06/10/2015)   Baseline 45/56 on 06/10/15   Status Achieved           PT Long Term Goals - 05/12/15 1315    PT LONG TERM GOAL #1   Title Patient verbalizes and demonstrates proper prosthetic care to enable safe use of prosthesis.  (Target Date: 07/08/2015)   Time 8   Period Weeks   Status New   PT LONG TERM GOAL #2   Title Patient tolerates wear of prosthesis >90% of awake hours with no skin issues or tenderness to enable functional use throughout his day.  (Target Date: 07/08/2015)   Time 8   Period Weeks   Status New   PT LONG TERM GOAL #3   Title Patient reports chronic pain similar to prior level with increase in pain </= 2 increments with standing & gait activities.  (Target Date: 07/08/2015)   Time 8   Period Weeks   Status New   PT LONG TERM GOAL #4   Title Patient self-reports >20% increase in functional status using FOTO.  (Target Date: 07/08/2015)   Time 8   Period Weeks   Status New   PT LONG TERM GOAL #5   Title Patient ambulates >500' including grass, ramps, curbs & stairs (1 rail) with single point cane or less and prosthesis modified independent.  (Target Date: 07/08/2015)   Time 8   Period Weeks   Status New   Additional Long Term Goals   Additional Long Term Goals Yes   PT LONG TERM  GOAL #6   Title Patient ambulates 100' with prosthesis only carrying 15# objects around furniture indpendently.  (Target Date: 07/08/2015)   Time 8   Period Weeks   Status New            Plan - 06/15/15 1539    Clinical Impression Statement Pt making steady progress toward goals. Challenged with balance activities/balance board today.   Pt will benefit from skilled therapeutic intervention in order to improve on the following deficits Abnormal gait;Decreased activity tolerance;Decreased balance;Decreased endurance;Decreased mobility;Decreased range of motion;Decreased knowledge of use of DME;Decreased strength;Postural dysfunction;Prosthetic Dependency   Rehab Potential Good   PT Frequency 2x / week   PT Duration 8 weeks   PT Treatment/Interventions ADLs/Self Care Home Management;DME Instruction;Gait training;Stair training;Functional mobility training;Therapeutic activities;Therapeutic exercise;Balance training;Neuromuscular re-education;Patient/family education;Prosthetic Training   PT Next Visit Plan continue with gait with cane, balance and strengthening   Consulted and Agree with Plan of Care Patient      Problem List Patient Active Problem List   Diagnosis Date Noted  . Sepsis ([place]) 01/14/2015  . Opacity of lung on imaging study 01/14/2015  . Angina pectoris associated with type 2 diabetes mellitus ([place]) 10/28/2014  . Wound infection ([place]) 09/26/2014  . Diabetes mellitus type 2, controlled ([place]) 08/25/2014  . Essential hypertension 08/25/2014  . Hyperlipidemia 08/25/2014  . 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 Foot ulcer, left ([place]) 11/24/2012  . Chronic total occlusion of artery of the extremities ([place]) 04/08/2012  . Cellulitis 03/19/2012  . PAD (peripheral artery disease) ([place]) 02/26/2012  . Dizziness 02/26/2012  . Onychomycosis 02/01/2012  . Diabetes mellitus ([place]) 11/01/2011  . Occlusion and stenosis of carotid artery without  mention of cerebral infarction 06/12/2011  .  Aftercare following surgery of the circulatory system, [place] 06/12/2011  . GERD (gastroesophageal reflux disease) 05/08/2011  . Barrett's esophagus without dysplasia 05/08/2011  . PVD (peripheral vascular disease) ([place]) 02/01/2011  . HTN (hypertension) 02/01/2011  . Tobacco abuse 02/01/2011    Willow Ora 06/16/2015, 12:29 PM  Willow Ora, PTA, [facility] [address] [place], [place] [address] [phone] 06/16/2015, 12:29 PM

## 2015-06-16 ENCOUNTER — Encounter: Payer: Self-pay | Admitting: Physical Therapy

## 2015-06-16 NOTE — Therapy (Signed)
[facility] [address] [place], [place], [address] Phone: [phone]   Fax:  [phone]  Patient Details  Name: [name] MRN: [id number] Date of Birth: 01/09/1956 Referring Provider:  No ref. provider found  Encounter Date: 06/16/2015  Physical Therapy Progress Note  Dates of Reporting Period: 05/12/2015 to 06/15/2015  Objective Reports of Subjective Statement: patient reports increased mobility with prosthesis with less strain and pain on non-amputated limb.  Objective Measurements: Patient is increasing wear of prosthesis with instruction which increases percent of awake hours he can be active. Patient has improved gait with prosthesis.  Goal Update:      PT Short Term Goals - 06/13/15 1315    PT SHORT TERM GOAL #1   Title Patient verbalizes proper prosthetic care with minimal cues for problem solving. (Target Date: 06/10/2015)   Status Achieved   PT SHORT TERM GOAL #2   Title Patient tolerates >12 hrs/day without negative change in skin issues and no tenderness to limb.  (Target Date: 06/10/2015)   Baseline 06/13/2015 Patient reports wear 11hours /day over the weekend with no skin issues but 5/10 limb pain. He is scheduled to see prosthetist 06/14/15 for adjustments.   Status Not Met   PT SHORT TERM GOAL #3   Title Patient ambulates 250' with single point cane & prosthesis with cues for gait deviations only no physical assist needed.  (Target Date: 06/10/2015)   Baseline met before 2/30/16   Status Achieved   PT SHORT TERM GOAL #4   Title Patient negotiates ramps, curbs & stairs (1 rail) with single point cane & prosthesis with supervision.  (Target Date: 06/10/2015)   Baseline met before 06/10/15   Status Achieved   PT SHORT TERM GOAL #5   Title Berg Balance >45/56  (Target Date: 06/10/2015)   Baseline 45/56 on 06/10/15   Status Achieved      Plan:      Plan - 06/15/15 1539    Clinical Impression Statement  Pt making steady progress toward goals. Challenged with balance activities/balance board today.   Pt will benefit from skilled therapeutic intervention in order to improve on the following deficits Abnormal gait;Decreased activity tolerance;Decreased balance;Decreased endurance;Decreased mobility;Decreased range of motion;Decreased knowledge of use of DME;Decreased strength;Postural dysfunction;Prosthetic Dependency   Rehab Potential Good   PT Frequency 2x / week   PT Duration 8 weeks   PT Treatment/Interventions ADLs/Self Care Home Management;DME Instruction;Gait training;Stair training;Functional mobility training;Therapeutic activities;Therapeutic exercise;Balance training;Neuromuscular re-education;Patient/family education;Prosthetic Training   PT Next Visit Plan continue with gait with cane, balance and strengthening   Consulted and Agree with Plan of Care Patient       Reason Skilled Services are Required: Patient requires skilled care & instruction to progress mobility with prosthesis to full community level.    WALDRON,ROBIN PT, DPT 06/16/2015, 9:17 PM  [facility] [address] [place], [place], [address] Phone: [phone]   Fax:  [phone] 

## 2015-06-20 ENCOUNTER — Ambulatory Visit: Payer: BLUE CROSS/BLUE SHIELD | Admitting: Physical Therapy

## 2015-06-20 ENCOUNTER — Encounter: Payer: Self-pay | Admitting: Physical Therapy

## 2015-06-20 DIAGNOSIS — R269 Unspecified abnormalities of gait and mobility: Secondary | ICD-10-CM | POA: Diagnosis not present

## 2015-06-20 DIAGNOSIS — R531 Weakness: Secondary | ICD-10-CM

## 2015-06-20 DIAGNOSIS — R2689 Other abnormalities of gait and mobility: Secondary | ICD-10-CM

## 2015-06-20 DIAGNOSIS — R2681 Unsteadiness on feet: Secondary | ICD-10-CM

## 2015-06-20 DIAGNOSIS — R6889 Other general symptoms and signs: Secondary | ICD-10-CM

## 2015-06-20 DIAGNOSIS — Z89511 Acquired absence of right leg below knee: Secondary | ICD-10-CM

## 2015-06-20 DIAGNOSIS — Z4789 Encounter for other orthopedic aftercare: Secondary | ICD-10-CM

## 2015-06-20 NOTE — Therapy (Signed)
[facility] [address] [place], [place], [address] Phone: [phone]   Fax:  [phone]  Physical Therapy Treatment  Patient Details  Name: [name] MRN: [id number] Date of Birth: 18-Apr-1956 Referring Provider:  Anda Kraft, MD  Encounter Date: 06/20/2015      PT End of Session - 06/20/15 1240    Visit Number 11   Number of Visits 17   Date for PT Re-Evaluation 07/08/15   Authorization Type Medicare 2nd G-code every 10th visit   PT Start Time 1240   PT Stop Time 1320   PT Time Calculation (min) 40 min   Equipment Utilized During Treatment Gait belt   Activity Tolerance Patient tolerated treatment well   Behavior During Therapy [facility] for tasks assessed/performed      Past Medical History  Diagnosis Date  . Carotid artery occlusion 11/10/10    LEFT CAROTID ENDARTERECTOMY  . Diabetes mellitus   . Hypertension   . Hyperlipidemia   . COPD (chronic obstructive pulmonary disease) ([place])   . DJD (degenerative joint disease)   . GERD (gastroesophageal reflux disease)   . Substernal chest pain   . 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 Slurred speech     AS PER WIFE IN D/C NOTE 11/10/10  . PAD (peripheral artery disease) ([place])     Distal aortogram June 2012. Atherectomy left popliteal artery July 2012.   . Barrett's esophagus   . Fatty liver   . Diverticulosis of colon (without mention of hemorrhage)   . Emphysema of lung ([place])   . Osteomyelitis ([place])   . Complication of anesthesia     BP WENT UP AT [facility] "  . Sleep apnea     has not used CPAP in e years - took self off  . H/O hiatal hernia   . Neuromuscular disorder ([place])     peripheral neuropathy  . Full dentures   . Wears glasses   . CHF (congestive heart failure) ([place])   . History of echocardiogram 08/22/2011    Mildly hypertrophic left ventricle with normal systolic function and mild diastolic dysfunction. mildly dilated  left atrium. no significant valvular abnormalities. no pericardial effusion no change from previous study 10/2010. the ejection fraction =>55%  . History of cardiovascular stress test 03/23/2011    ejection fraction measured 51%, with an end-diastolic volume of 940 ml and an end-systolic volume of 81 ml. no evidence of myocardial ischemia.  . H/O Doppler ultrasound 06/28/2012    Lower venous duplex left-- findings consistent with acute deep vein thrombosis involving the personal vein of the left lower extremity. No evidence of Baker's cyst on the left. Duplex scan of the left lower extremity arteries reveal adequate blood flow.   . History of Holter monitoring 02/14/2012    Scan quality is fair due to baseline artifact. Predominant rhythm is Sinus. Veentricular Ectopy was noted in single beat form. The total recording time was 24:00 hours. total beats =[id number]. the total time analyzed was 23:58 hours. Heart  Max = 102 at 1:18:22, Min= 52 at 9:56:19 am., Avg= 70. There were 0 pause(s) > 2.5 seconds. the longest pause = 0.00 sec. at 1:51 pm(1)  . OSA (obstructive sleep apnea) 12/26/08    AHI of 6.3/hr overall, 32.7/hr during REM sleep and an RDI of 24.8/hr and 50.9/hr.    Past Surgical History  Procedure Laterality Date  . Laminectomy  X 3 LUMBAR AND X 2 CERVICAL SPINE OPERATIONS  . Carotid endarterectomy  11/10/10  . Hernia repair      LEFT INGUINAL AND UMBILICAL REPAIRS  . Anterior fusion cervical spine  02/06/06    C4-5, C5-6, C6-7; SURGEON DR. MAX COHEN  . Penile prosthesis implant  08/14/05    INFRAPUBIC INSERTION OF INFLATABLE PENILE PROSTHESIS; SURGEON DR. Amalia Hailey  . Laparoscopic cholecystectomy w/ cholangiography  11/09/04    SURGEON DR. Luella Cook  . Total knee arthroplasty  07/2002    RIGHT KNEE ; SURGEON  DR. [name] ALSO HAD ARTHROSCOPIC RIGHT KNEE IN  10/2001  . Shoulder arthroscopy    . Cholecystectomy    . Amputation  11/05/2011    Procedure: AMPUTATION RAY;  Surgeon: Wylene Simmer,  MD;  Location: [facility];  Service: Orthopedics;  Laterality: Right;  Amputation of Right 4&5th Toes  . Femoral artery stent      x6  . Amputation Left 11/26/2012    Procedure: AMPUTATION RAY;  Surgeon: Wylene Simmer, MD;  Location: [facility];  Service: Orthopedics;  Laterality: Left;  fourth ray amputation  . Cardiac catheterization  10/31/04    2009  . Carpal tunnel release Right 10/21/2013    Procedure: RIGHT CARPAL TUNNEL RELEASE;  Surgeon: Wynonia Sours, MD;  Location: [facility];  Service: Orthopedics;  Laterality: Right;  . Ulnar nerve transposition Right 10/21/2013    Procedure: RIGHT ELBOW  ULNAR NERVE DECOMPRESSION;  Surgeon: Wynonia Sours, MD;  Location: [facility];  Service: Orthopedics;  Laterality: Right;  . Joint replacement Right 2001    Total  . Spine surgery    . Neck surgery    . Esophageal manometry Bilateral 07/19/2014    Procedure: ESOPHAGEAL MANOMETRY (EM);  Surgeon: Jerene Bears, MD;  Location: [facility];  Service: Gastroenterology;  Laterality: Bilateral;  . Lower extremity angiogram N/A 03/19/2012    Procedure: LOWER EXTREMITY ANGIOGRAM;  Surgeon: Burnell Blanks, MD;  [facility] C[facility] LAB;  Service: Cardiovascular;  Laterality: N/A;  . Amputation Right 08/27/2014    Procedure: Transmetatarsal Amputation;  Surgeon: Newt Minion, MD;  Location: [facility];  Service: Orthopedics;  Laterality: Right;  . Left heart catheterization with coronary angiogram N/A 10/29/2014    Procedure: LEFT HEART CATHETERIZATION WITH CORONARY ANGIOGRAM;  Surgeon: Laverda Page, MD;  Location: [facility] CATH LAB;  Service: Cardiovascular;  Laterality: N/A;  . Percutaneous coronary stent intervention (pci-s) Right 10/29/2014    Procedure: PERCUTANEOUS CORONARY STENT INTERVENTION (PCI-S);  Surgeon: Laverda Page, MD;  Location: [facility] CATH LAB;  Service: Cardiovascular;  Laterality: Right;  . Amputation Right 01/14/2015    Procedure: AMPUTATION BELOW KNEE;  Surgeon: Newt Minion, MD;  Location: [facility];  Service: Orthopedics;  Laterality: Right;  . Carotid endarterectomy Left 11/10/2010    Subtotal occlusion of left internal carotid artery with left hemispheric transient ischemic attacks.    There were no vitals filed for this visit.  Visit Diagnosis:  Abnormality of gait  Unsteadiness  Balance problems  Decreased functional activity tolerance  Functional weakness  Encounter for prosthetic gait training  Status post below knee amputation of right lower extremity (HCC)      Subjective Assessment - 06/20/15 1243    Subjective Wearing prosthesis 10-11 hours /day which is ~ 1hr after arising until he gets in bath tub (evening) ~2 hrs before bed time.   Currently in Pain? No/denies  [facility] Adult PT Treatment/Exercise - 06/20/15 1240    Transfers   Transfers Floor to Transfer   Sit to Stand 6: Modified independent (Device/Increase time);With upper extremity assist;With armrests;From chair/3-in-1   Stand to Sit 6: Modified independent (Device/Increase time);With upper extremity assist;With armrests;To chair/3-in-1   Floor to Transfer 5: Supervision;With upper extremity assist;From bed  switching lead limb each rep   Floor to Transfer Details (indicate cue type and reason) PT demo, instructed technique prior & verbal cues during   Ambulation/Gait   Ambulation/Gait Yes   Ambulation/Gait Assistance 5: Supervision   Ambulation/Gait Assistance Details verbal cues on posture, step length & step width   Ambulation Distance (Feet) 500 Feet  500' x 2 outdoor/indoor with cane; 100' X 2 no device   Assistive device Prosthesis;Straight cane   Gait Pattern Step-through pattern;Decreased step length - left;Decreased stance time - right;Decreased stride length;Decreased hip/knee flexion - right;Trunk flexed;Abducted- right;Poor foot clearance - right   Ambulation Surface Indoor;Level;Outdoor;Paved;Gravel;Grass   Stairs Yes    Stairs Assistance 5: Supervision   Stairs Assistance Details (indicate cue type and reason) cues on wt shift & technique   Stair Management Technique One rail Right;One rail Left;Alternating pattern;Forwards;With cane   Number of Stairs 4  x 3 reps; changing rail location   Ramp 5: Supervision  with cane/prosthesis x 3 reps   Ramp Details (indicate cue type and reason) cues on technique   Curb 5: Supervision   Curb Details (indicate cue type and reason) cues on technique   Dynamic Standing Balance   Dynamic Standing - Balance Support --   Dynamic Standing - Level of Assistance --   Dynamic Standing - Balance Activities --   Rocker board comments: --   High Level Balance   High Level Balance Activities Negotiating over obstacles;Side stepping;Negotitating around obstacles;Backward walking   High Level Balance Comments Verbal cues on technique   Self-Care   Self-Care Lifting   Lifting lifting 15# box with verbal cues   Prosthetics   Prosthetic Care Comments  Fall risk when prosthesis off and out of bed. Drying limb & liner with sweating.   Current prosthetic wear tolerance (days/week)  daily   Current prosthetic wear tolerance (#hours/day)  11 hours straight drying prn; use of anit-perspirant   Residual limb condition  intact with no issues   Education Provided Correct ply sock adjustment;Proper weight-bearing schedule/adjustment;Proper wear schedule/adjustment;Residual limb care   Person(s) Educated Patient   Education Method Explanation;Verbal cues;Tactile cues   Education Method Verbalized understanding;Verbal cues required;Needs further instruction   Donning Prosthesis Modified independent (device/increased time)   Doffing Prosthesis Modified independent (device/increased time)                  PT Short Term Goals - 06/13/15 1315    PT SHORT TERM GOAL #1   Title Patient verbalizes proper prosthetic care with minimal cues for problem solving. (Target Date: 06/10/2015)    Status Achieved   PT SHORT TERM GOAL #2   Title Patient tolerates >12 hrs/day without negative change in skin issues and no tenderness to limb.  (Target Date: 06/10/2015)   Baseline 06/13/2015 Patient reports wear 11hours /day over the weekend with no skin issues but 5/10 limb pain. He is scheduled to see prosthetist 06/14/15 for adjustments.   Status Not Met   PT SHORT TERM GOAL #3   Title Patient ambulates 250' with single point cane & prosthesis with cues for gait deviations only no physical assist needed.  (Target Date: 06/10/2015)  Baseline met before 2/30/16   Status Achieved   PT SHORT TERM GOAL #4   Title Patient negotiates ramps, curbs & stairs (1 rail) with single point cane & prosthesis with supervision.  (Target Date: 06/10/2015)   Baseline met before 06/10/15   Status Achieved   PT SHORT TERM GOAL #5   Title [place] Balance >45/56  (Target Date: 06/10/2015)   Baseline 45/56 on 06/10/15   Status Achieved           PT Long Term Goals - 05/12/15 1315    PT LONG TERM GOAL #1   Title Patient verbalizes and demonstrates proper prosthetic care to enable safe use of prosthesis.  (Target Date: 07/08/2015)   Time 8   Period Weeks   Status New   PT LONG TERM GOAL #2   Title Patient tolerates wear of prosthesis >90% of awake hours with no skin issues or tenderness to enable functional use throughout his day.  (Target Date: 07/08/2015)   Time 8   Period Weeks   Status New   PT LONG TERM GOAL #3   Title Patient reports chronic pain similar to prior level with increase in pain </= 2 increments with standing & gait activities.  (Target Date: 07/08/2015)   Time 8   Period Weeks   Status New   PT LONG TERM GOAL #4   Title Patient self-reports >20% increase in functional status using FOTO.  (Target Date: 07/08/2015)   Time 8   Period Weeks   Status New   PT LONG TERM GOAL #5   Title Patient ambulates >500' including grass, ramps, curbs & stairs (1 rail) with single point cane or less  and prosthesis modified independent.  (Target Date: 07/08/2015)   Time 8   Period Weeks   Status New   Additional Long Term Goals   Additional Long Term Goals Yes   PT LONG TERM GOAL #6   Title Patient ambulates 100' with prosthesis only carrying 15# objects around furniture indpendently.  (Target Date: 07/08/2015)   Time 8   Period Weeks   Status New               Plan - 06/20/15 1240    Clinical Impression Statement Patient improved gait with cane including grass. Patient is on target to meet LTGs. Pateint improved lifting 15# box with skilled instruction.   Pt will benefit from skilled therapeutic intervention in order to improve on the following deficits Abnormal gait;Decreased activity tolerance;Decreased balance;Decreased endurance;Decreased mobility;Decreased range of motion;Decreased knowledge of use of DME;Decreased strength;Postural dysfunction;Prosthetic Dependency   Rehab Potential Good   PT Frequency 2x / week   PT Duration 8 weeks   PT Treatment/Interventions ADLs/Self Care Home Management;DME Instruction;Gait training;Stair training;Functional mobility training;Therapeutic activities;Therapeutic exercise;Balance training;Neuromuscular re-education;Patient/family education;Prosthetic Training   PT Next Visit Plan continue with household gait without devicce & community gait with cane, balance and strengthening   Consulted and Agree with Plan of Care Patient        Problem List Patient Active Problem List   Diagnosis Date Noted  . Sepsis ([place]) 01/14/2015  . Opacity of lung on imaging study 01/14/2015  . Angina pectoris associated with type 2 diabetes mellitus ([place]) 10/28/2014  . Wound infection ([place]) 09/26/2014  . Diabetes mellitus type 2, controlled ([place]) 08/25/2014  . Essential hypertension 08/25/2014  . Hyperlipidemia 08/25/2014  . 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 Foot ulcer, left ([place]) 11/24/2012  . Chronic  total occlusion of artery of the extremities ([place]) 04/08/2012  . Cellulitis 03/19/2012  . PAD (peripheral artery disease) ([place]) 02/26/2012  . Dizziness 02/26/2012  . Onychomycosis 02/01/2012  . Diabetes mellitus ([place]) 11/01/2011  . Occlusion and stenosis of carotid artery without mention of cerebral infarction 06/12/2011  . Aftercare following surgery of the circulatory system, [place] 06/12/2011  . GERD (gastroesophageal reflux disease) 05/08/2011  . Barrett's esophagus without dysplasia 05/08/2011  . PVD (peripheral vascular disease) ([place]) 02/01/2011  . HTN (hypertension) 02/01/2011  . Tobacco abuse 02/01/2011    Jamey Reas PT, DPT 06/20/2015, 5:43 PM  [facility] [address] [place], [place], [address] Phone: [phone]   Fax:  [phone]

## 2015-06-23 ENCOUNTER — Ambulatory Visit: Payer: BLUE CROSS/BLUE SHIELD | Admitting: Physical Therapy

## 2015-06-23 ENCOUNTER — Encounter: Payer: Self-pay | Admitting: Physical Therapy

## 2015-06-23 DIAGNOSIS — R269 Unspecified abnormalities of gait and mobility: Secondary | ICD-10-CM | POA: Diagnosis not present

## 2015-06-23 DIAGNOSIS — R2689 Other abnormalities of gait and mobility: Secondary | ICD-10-CM

## 2015-06-23 DIAGNOSIS — Z89511 Acquired absence of right leg below knee: Secondary | ICD-10-CM

## 2015-06-23 DIAGNOSIS — R6889 Other general symptoms and signs: Secondary | ICD-10-CM

## 2015-06-23 DIAGNOSIS — R531 Weakness: Secondary | ICD-10-CM

## 2015-06-23 DIAGNOSIS — R2681 Unsteadiness on feet: Secondary | ICD-10-CM

## 2015-06-23 DIAGNOSIS — Z4789 Encounter for other orthopedic aftercare: Secondary | ICD-10-CM

## 2015-06-23 NOTE — Therapy (Signed)
[facility] [address] [place], [place], [address] Phone: [phone]   Fax:  [phone]  Physical Therapy Treatment  Patient Details  Name: [name] MRN: [id number] Date of Birth: February 26, 1956 No Data Recorded  Encounter Date: 06/23/2015      PT End of Session - 06/23/15 1410    Visit Number 12   Number of Visits 17   Date for PT Re-Evaluation 07/08/15   Authorization Type Medicare 2nd G-code every 10th visit   PT Start Time 1416   PT Stop Time 1445   PT Time Calculation (min) 29 min   Equipment Utilized During Treatment Gait belt   Activity Tolerance Patient tolerated treatment well   Behavior During Therapy [facility] for tasks assessed/performed      Past Medical History  Diagnosis Date  . Carotid artery occlusion 11/10/10    LEFT CAROTID ENDARTERECTOMY  . Diabetes mellitus   . Hypertension   . Hyperlipidemia   . COPD (chronic obstructive pulmonary disease) ([place])   . DJD (degenerative joint disease)   . GERD (gastroesophageal reflux disease)   . Substernal chest pain   . 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 Slurred speech     AS PER WIFE IN D/C NOTE 11/10/10  . PAD (peripheral artery disease) ([place])     Distal aortogram June 2012. Atherectomy left popliteal artery July 2012.   . Barrett's esophagus   . Fatty liver   . Diverticulosis of colon (without mention of hemorrhage)   . Emphysema of lung ([place])   . Osteomyelitis ([place])   . Complication of anesthesia     BP WENT UP AT [facility] "  . Sleep apnea     has not used CPAP in e years - took self off  . H/O hiatal hernia   . Neuromuscular disorder ([place])     peripheral neuropathy  . Full dentures   . Wears glasses   . CHF (congestive heart failure) ([place])   . History of echocardiogram 08/22/2011    Mildly hypertrophic left ventricle with normal systolic function and mild diastolic dysfunction. mildly dilated left atrium. no  significant valvular abnormalities. no pericardial effusion no change from previous study 10/2010. the ejection fraction =>55%  . History of cardiovascular stress test 03/23/2011    ejection fraction measured 51%, with an end-diastolic volume of 924 ml and an end-systolic volume of 81 ml. no evidence of myocardial ischemia.  . H/O Doppler ultrasound 06/28/2012    Lower venous duplex left-- findings consistent with acute deep vein thrombosis involving the personal vein of the left lower extremity. No evidence of Baker's cyst on the left. Duplex scan of the left lower extremity arteries reveal adequate blood flow.   . History of Holter monitoring 02/14/2012    Scan quality is fair due to baseline artifact. Predominant rhythm is Sinus. Veentricular Ectopy was noted in single beat form. The total recording time was 24:00 hours. total beats =[id number]. the total time analyzed was 23:58 hours. Heart  Max = 102 at 1:18:22, Min= 52 at 9:56:19 am., Avg= 70. There were 0 pause(s) > 2.5 seconds. the longest pause = 0.00 sec. at 1:51 pm(1)  . OSA (obstructive sleep apnea) 12/26/08    AHI of 6.3/hr overall, 32.7/hr during REM sleep and an RDI of 24.8/hr and 50.9/hr.    Past Surgical History  Procedure Laterality Date  . Laminectomy      X 3 LUMBAR  AND X 2 CERVICAL SPINE OPERATIONS  . Carotid endarterectomy  11/10/10  . Hernia repair      LEFT INGUINAL AND UMBILICAL REPAIRS  . Anterior fusion cervical spine  02/06/06    C4-5, C5-6, C6-7; SURGEON DR. MAX COHEN  . Penile prosthesis implant  08/14/05    INFRAPUBIC INSERTION OF INFLATABLE PENILE PROSTHESIS; SURGEON DR. Amalia Hailey  . Laparoscopic cholecystectomy w/ cholangiography  11/09/04    SURGEON DR. Luella Cook  . Total knee arthroplasty  07/2002    RIGHT KNEE ; SURGEON  DR. [name] ALSO HAD ARTHROSCOPIC RIGHT KNEE IN  10/2001  . Shoulder arthroscopy    . Cholecystectomy    . Amputation  11/05/2011    Procedure: AMPUTATION RAY;  Surgeon: Wylene Simmer, MD;  Location: [facility];  Service: Orthopedics;  Laterality: Right;  Amputation of Right 4&5th Toes  . Femoral artery stent      x6  . Amputation Left 11/26/2012    Procedure: AMPUTATION RAY;  Surgeon: Wylene Simmer, MD;  Location: [facility];  Service: Orthopedics;  Laterality: Left;  fourth ray amputation  . Cardiac catheterization  10/31/04    2009  . Carpal tunnel release Right 10/21/2013    Procedure: RIGHT CARPAL TUNNEL RELEASE;  Surgeon: Wynonia Sours, MD;  Location: [facility];  Service: Orthopedics;  Laterality: Right;  . Ulnar nerve transposition Right 10/21/2013    Procedure: RIGHT ELBOW  ULNAR NERVE DECOMPRESSION;  Surgeon: Wynonia Sours, MD;  Location: [facility];  Service: Orthopedics;  Laterality: Right;  . Joint replacement Right 2001    Total  . Spine surgery    . Neck surgery    . Esophageal manometry Bilateral 07/19/2014    Procedure: ESOPHAGEAL MANOMETRY (EM);  Surgeon: Jerene Bears, MD;  Location: [facility];  Service: Gastroenterology;  Laterality: Bilateral;  . Lower extremity angiogram N/A 03/19/2012    Procedure: LOWER EXTREMITY ANGIOGRAM;  Surgeon: Burnell Blanks, MD;  Location: [facility] CATH LAB;  Service: Cardiovascular;  Laterality: N/A;  . Amputation Right 08/27/2014    Procedure: Transmetatarsal Amputation;  Surgeon: Newt Minion, MD;  Location: [facility];  Service: Orthopedics;  Laterality: Right;  . Left heart catheterization with coronary angiogram N/A 10/29/2014    Procedure: LEFT HEART CATHETERIZATION WITH CORONARY ANGIOGRAM;  Surgeon: Laverda Page, MD;  Location: [facility] CATH LAB;  Service: Cardiovascular;  Laterality: N/A;  . Percutaneous coronary stent intervention (pci-s) Right 10/29/2014    Procedure: PERCUTANEOUS CORONARY STENT INTERVENTION (PCI-S);  Surgeon: Laverda Page, MD;  Location: [facility] CATH LAB;  Service: Cardiovascular;  Laterality: Right;  . Amputation Right 01/14/2015    Procedure: AMPUTATION BELOW KNEE;  Surgeon: Newt Minion, MD;  Location:  [facility];  Service: Orthopedics;  Laterality: Right;  . Carotid endarterectomy Left 11/10/2010    Subtotal occlusion of left internal carotid artery with left hemispheric transient ischemic attacks.    There were no vitals filed for this visit.  Visit Diagnosis:  Abnormality of gait  Unsteadiness  Balance problems  Decreased functional activity tolerance  Functional weakness  Encounter for prosthetic gait training  Status post below knee amputation of right lower extremity ([place])      Subjective Assessment - 06/23/15 1410    Subjective Patient reports he feels he has significant play in socket (He demonstrates difference in how much his limb seperates from socket with unweighting it)   Currently in Pain? No/denies  [facility] Adult PT Treatment/Exercise - 06/23/15 1410    Transfers   Transfers Floor to Transfer   Sit to Stand 6: Modified independent (Device/Increase time);With upper extremity assist;With armrests;From chair/3-in-1   Stand to Sit 6: Modified independent (Device/Increase time);With upper extremity assist;With armrests;To chair/3-in-1   Floor to Transfer --   Ambulation/Gait   Ambulation/Gait Yes   Ambulation/Gait Assistance 5: Supervision   Ambulation/Gait Assistance Details tactile & verbal cues to maintain path & pace with head turns to scan environment, posture & step length   Ambulation Distance (Feet) 500 Feet   Assistive device Prosthesis   Gait Pattern Step-through pattern;Decreased step length - left;Decreased stance time - right;Decreased stride length;Decreased hip/knee flexion - right;Trunk flexed;Abducted- right;Poor foot clearance - right   Ambulation Surface Indoor;Level   Stairs Yes   Stairs Assistance 5: Supervision   Stairs Assistance Details (indicate cue type and reason) verbal cues on prosthetic foot position to facilitate smooth knee flexion with descent   Stair Management Technique One rail Right;One rail  Left;Alternating pattern;Forwards   Number of Stairs 4  x 3 reps; changing rail location   Ramp 5: Supervision  prosthesis only   Ramp Details (indicate cue type and reason) cues on posture & wt shift    Curb 5: Supervision  prosthesis only   Curb Details (indicate cue type and reason) demo & verbal cues on maintaining momentum & step thru pattern for balance   High Level Balance   High Level Balance Activities Negotiating over obstacles;Side stepping;Negotitating around obstacles;Backward walking;Direction changes;Turns;Head turns   High Level Balance Comments visual, verbal & tactile cues on shifting wt and turn feet into direction of motion with no back stepping for turns,    Self-Care   Self-Care --   Lifting --   Prosthetics   Prosthetic Care Comments  assess when liner has slipped and re-donning to decrease pistoning feeling; use of clinical strength anti-perspriant 2 nights /wk to improve sweat management   Current prosthetic wear tolerance (days/week)  daily   Current prosthetic wear tolerance (#hours/day)  >90% of awake hours   Residual limb condition  intact with no issues   Education Provided Correct ply sock adjustment;Proper weight-bearing schedule/adjustment;Proper wear schedule/adjustment;Residual limb care  see above care comments   Person(s) Educated Patient   Education Method Explanation;Demonstration;Tactile cues;Verbal cues   Education Method Verbalized understanding;Returned demonstration;Tactile cues required;Verbal cues required;Needs further instruction                  PT Short Term Goals - 06/13/15 1315    PT SHORT TERM GOAL #1   Title Patient verbalizes proper prosthetic care with minimal cues for problem solving. (Target Date: 06/10/2015)   Status Achieved   PT SHORT TERM GOAL #2   Title Patient tolerates >12 hrs/day without negative change in skin issues and no tenderness to limb.  (Target Date: 06/10/2015)   Baseline 06/13/2015 Patient reports  wear 11hours /day over the weekend with no skin issues but 5/10 limb pain. He is scheduled to see prosthetist 06/14/15 for adjustments.   Status Not Met   PT SHORT TERM GOAL #3   Title Patient ambulates 250' with single point cane & prosthesis with cues for gait deviations only no physical assist needed.  (Target Date: 06/10/2015)   Baseline met before 2/30/16   Status Achieved   PT SHORT TERM GOAL #4   Title Patient negotiates ramps, curbs & stairs (1 rail) with single point cane & prosthesis with supervision.  (Target Date:  06/10/2015)   Baseline met before 06/10/15   Status Achieved   PT SHORT TERM GOAL #5   Title Berg Balance >45/56  (Target Date: 06/10/2015)   Baseline 45/56 on 06/10/15   Status Achieved           PT Long Term Goals - 05/12/15 1315    PT LONG TERM GOAL #1   Title Patient verbalizes and demonstrates proper prosthetic care to enable safe use of prosthesis.  (Target Date: 07/08/2015)   Time 8   Period Weeks   Status New   PT LONG TERM GOAL #2   Title Patient tolerates wear of prosthesis >90% of awake hours with no skin issues or tenderness to enable functional use throughout his day.  (Target Date: 07/08/2015)   Time 8   Period Weeks   Status New   PT LONG TERM GOAL #3   Title Patient reports chronic pain similar to prior level with increase in pain </= 2 increments with standing & gait activities.  (Target Date: 07/08/2015)   Time 8   Period Weeks   Status New   PT LONG TERM GOAL #4   Title Patient self-reports >20% increase in functional status using FOTO.  (Target Date: 07/08/2015)   Time 8   Period Weeks   Status New   PT LONG TERM GOAL #5   Title Patient ambulates >500' including grass, ramps, curbs & stairs (1 rail) with single point cane or less and prosthesis modified independent.  (Target Date: 07/08/2015)   Time 8   Period Weeks   Status New   Additional Long Term Goals   Additional Long Term Goals Yes   PT LONG TERM GOAL #6   Title Patient  ambulates 100' with prosthesis only carrying 15# objects around furniture indpendently.  (Target Date: 07/08/2015)   Time 8   Period Weeks   Status New               Plan - 06/23/15 1445    Clinical Impression Statement Patient improved ability to scan environment with maintaining path with instruction & practice. He also improved changing directions with less balance loss with instruction in technique.   Pt will benefit from skilled therapeutic intervention in order to improve on the following deficits Abnormal gait;Decreased activity tolerance;Decreased balance;Decreased endurance;Decreased mobility;Decreased range of motion;Decreased knowledge of use of DME;Decreased strength;Postural dysfunction;Prosthetic Dependency   Rehab Potential Good   PT Frequency 2x / week   PT Duration 8 weeks   PT Treatment/Interventions ADLs/Self Care Home Management;DME Instruction;Gait training;Stair training;Functional mobility training;Therapeutic activities;Therapeutic exercise;Balance training;Neuromuscular re-education;Patient/family education;Prosthetic Training   PT Next Visit Plan continue with household gait without devicce & community gait with cane, balance and strengthening   Consulted and Agree with Plan of Care Patient        Problem List Patient Active Problem List   Diagnosis Date Noted  . Sepsis ([place]) 01/14/2015  . Opacity of lung on imaging study 01/14/2015  . Angina pectoris associated with type 2 diabetes mellitus ([place]) 10/28/2014  . Wound infection ([place]) 09/26/2014  . Diabetes mellitus type 2, controlled ([place]) 08/25/2014  . Essential hypertension 08/25/2014  . Hyperlipidemia 08/25/2014  . 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 Foot ulcer, left ([place]) 11/24/2012  . Chronic total occlusion of artery of the extremities ([place]) 04/08/2012  . Cellulitis 03/19/2012  . PAD (peripheral artery disease) ([place]) 02/26/2012  . Dizziness 02/26/2012   . Onychomycosis 02/01/2012  . Diabetes mellitus (  [place]) 11/01/2011  . Occlusion and stenosis of carotid artery without mention of cerebral infarction 06/12/2011  . Aftercare following surgery of the circulatory system, [place] 06/12/2011  . GERD (gastroesophageal reflux disease) 05/08/2011  . Barrett's esophagus without dysplasia 05/08/2011  . PVD (peripheral vascular disease) ([place]) 02/01/2011  . HTN (hypertension) 02/01/2011  . Tobacco abuse 02/01/2011    Jamey Reas PT, DPT 06/23/2015, 8:47 PM  [facility] [address] [place], [place], [address] Phone: [phone]   Fax:  [phone]  Name: GIORGI DEBRUIN MRN: [id number] Date of Birth: [birth year]

## 2015-06-27 ENCOUNTER — Ambulatory Visit: Payer: BLUE CROSS/BLUE SHIELD | Admitting: Physical Therapy

## 2015-06-27 ENCOUNTER — Encounter: Payer: BLUE CROSS/BLUE SHIELD | Admitting: Physical Therapy

## 2015-06-29 ENCOUNTER — Ambulatory Visit: Payer: BLUE CROSS/BLUE SHIELD | Admitting: Physical Therapy

## 2015-06-29 ENCOUNTER — Encounter: Payer: Self-pay | Admitting: Physical Therapy

## 2015-06-29 DIAGNOSIS — R269 Unspecified abnormalities of gait and mobility: Secondary | ICD-10-CM | POA: Diagnosis not present

## 2015-06-29 DIAGNOSIS — R531 Weakness: Secondary | ICD-10-CM

## 2015-06-29 DIAGNOSIS — R2681 Unsteadiness on feet: Secondary | ICD-10-CM

## 2015-06-29 DIAGNOSIS — Z4789 Encounter for other orthopedic aftercare: Secondary | ICD-10-CM

## 2015-06-29 DIAGNOSIS — R2689 Other abnormalities of gait and mobility: Secondary | ICD-10-CM

## 2015-06-29 DIAGNOSIS — R6889 Other general symptoms and signs: Secondary | ICD-10-CM

## 2015-06-30 ENCOUNTER — Encounter: Payer: BLUE CROSS/BLUE SHIELD | Admitting: Physical Therapy

## 2015-07-01 NOTE — Therapy (Signed)
[facility] [address] [place], [place], [address] Phone: [phone]   Fax:  [phone]  Physical Therapy Treatment  Patient Details  Name: [name] MRN: [id number] Date of Birth: [birth year] No Data Recorded  Encounter Date: 06/29/2015   06/29/15 1548  PT Visits / Re-Eval  Visit Number 13  Number of Visits 17  Date for PT Re-Evaluation 07/08/15  Authorization  Authorization Type Medicare 2nd G-code every 10th visit  PT Time Calculation  PT Start Time 1535  PT Stop Time 1615  PT Time Calculation (min) 40 min  PT - End of Session  Equipment Utilized During Treatment Gait belt  Activity Tolerance Patient tolerated treatment well  Behavior During Therapy [facility] for tasks assessed/performed      06/29/15 1545  Symptoms/Limitations  Subjective No falls. Still with complaints of socket being loose/having too much play when he off loads, even during gait.  Pain Assessment  Currently in Pain? No/denies  Pain Score 0    Past Medical History  Diagnosis Date  . Carotid artery occlusion 11/10/10    LEFT CAROTID ENDARTERECTOMY  . Diabetes mellitus   . Hypertension   . Hyperlipidemia   . COPD (chronic obstructive pulmonary disease) ([place])   . DJD (degenerative joint disease)   . GERD (gastroesophageal reflux disease)   . Substernal chest pain   . 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 Slurred speech     AS PER WIFE IN D/C NOTE 11/10/10  . PAD (peripheral artery disease) ([place])     Distal aortogram June 2012. Atherectomy left popliteal artery July 2012.   . Barrett's esophagus   . Fatty liver   . Diverticulosis of colon (without mention of hemorrhage)   . Emphysema of lung ([place])   . Osteomyelitis ([place])   . Complication of anesthesia     BP WENT UP AT [facility] "  . Sleep apnea     has not used CPAP in e years - took self off  . H/O hiatal hernia   . Neuromuscular disorder ([place])    peripheral neuropathy  . Full dentures   . Wears glasses   . CHF (congestive heart failure) ([place])   . History of echocardiogram 08/22/2011    Mildly hypertrophic left ventricle with normal systolic function and mild diastolic dysfunction. mildly dilated left atrium. no significant valvular abnormalities. no pericardial effusion no change from previous study 10/2010. the ejection fraction =>55%  . History of cardiovascular stress test 03/23/2011    ejection fraction measured 51%, with an end-diastolic volume of 544 ml and an end-systolic volume of 81 ml. no evidence of myocardial ischemia.  . H/O Doppler ultrasound 06/28/2012    Lower venous duplex left-- findings consistent with acute deep vein thrombosis involving the personal vein of the left lower extremity. No evidence of Baker's cyst on the left. Duplex scan of the left lower extremity arteries reveal adequate blood flow.   . History of Holter monitoring 02/14/2012    Scan quality is fair due to baseline artifact. Predominant rhythm is Sinus. Veentricular Ectopy was noted in single beat form. The total recording time was 24:00 hours. total beats =[id number]. the total time analyzed was 23:58 hours. Heart  Max = 102 at 1:18:22, Min= 52 at 9:56:19 am., Avg= 70. There were 0 pause(s) > 2.5 seconds. the longest pause = 0.00 sec. at 1:51 pm(1)  . OSA (obstructive sleep apnea) 12/26/08  AHI of 6.3/hr overall, 32.7/hr during REM sleep and an RDI of 24.8/hr and 50.9/hr.    Past Surgical History  Procedure Laterality Date  . Laminectomy      X 3 LUMBAR AND X 2 CERVICAL SPINE OPERATIONS  . Carotid endarterectomy  11/10/10  . Hernia repair      LEFT INGUINAL AND UMBILICAL REPAIRS  . Anterior fusion cervical spine  02/06/06    C4-5, C5-6, C6-7; SURGEON DR. MAX COHEN  . Penile prosthesis implant  08/14/05    INFRAPUBIC INSERTION OF INFLATABLE PENILE PROSTHESIS; SURGEON DR. Amalia Hailey  . Laparoscopic cholecystectomy w/ cholangiography  11/09/04    SURGEON DR. Luella Cook  . Total knee arthroplasty  07/2002    RIGHT KNEE ; SURGEON  DR. [name] ALSO HAD ARTHROSCOPIC RIGHT KNEE IN  10/2001  . Shoulder arthroscopy    . Cholecystectomy    . Amputation  11/05/2011    Procedure: AMPUTATION RAY;  Surgeon: Wylene Simmer, MD;  Location: [facility];  Service: Orthopedics;  Laterality: Right;  Amputation of Right 4&5th Toes  . Femoral artery stent      x6  . Amputation Left 11/26/2012    Procedure: AMPUTATION RAY;  Surgeon: Wylene Simmer, MD;  Location: [facility];  Service: Orthopedics;  Laterality: Left;  fourth ray amputation  . Cardiac catheterization  10/31/04    2009  . Carpal tunnel release Right 10/21/2013    Procedure: RIGHT CARPAL TUNNEL RELEASE;  Surgeon: Wynonia Sours, MD;  Location: [facility];  Service: Orthopedics;  Laterality: Right;  . Ulnar nerve transposition Right 10/21/2013    Procedure: RIGHT ELBOW  ULNAR NERVE DECOMPRESSION;  Surgeon: Wynonia Sours, MD;  Location: [facility];  Service: Orthopedics;  Laterality: Right;  . Joint replacement Right 2001    Total  . Spine surgery    . Neck surgery    . Esophageal manometry Bilateral 07/19/2014    Procedure: ESOPHAGEAL MANOMETRY (EM);  Surgeon: Jerene Bears, MD;  Location: [facility];  Service: Gastroenterology;  Laterality: Bilateral;  . Lower extremity angiogram N/A 03/19/2012    Procedure: LOWER EXTREMITY ANGIOGRAM;  Surgeon: Burnell Blanks, MD;  Location[facility] CATH LAB;  Service: Cardiovascular;  Laterality: N/A;  . Amputation Right 08/27/2014    Procedure: Transmetatarsal Amputation;  Surgeon: Newt Minion, MD;  Location: [facility];  Service: Orthopedics;  Laterality: Right;  . Left heart catheterization with coronary angiogram N/A 10/29/2014    Procedure: LEFT HEART CATHETERIZATION WITH CORONARY ANGIOGRAM;  Surgeon: Laverda Page, MD;  Location: [facility] CATH LAB;  Service: Cardiovascular;  Laterality: N/A;  . Percutaneous coronary stent intervention (pci-s) Right 10/29/2014     Procedure: PERCUTANEOUS CORONARY STENT INTERVENTION (PCI-S);  Surgeon: Laverda Page, MD;  Location: [facility] CATH LAB;  Service: Cardiovascular;  Laterality: Right;  . Amputation Right 01/14/2015    Procedure: AMPUTATION BELOW KNEE;  Surgeon: Newt Minion, MD;  Location: [facility];  Service: Orthopedics;  Laterality: Right;  . Carotid endarterectomy Left 11/10/2010    Subtotal occlusion of left internal carotid artery with left hemispheric transient ischemic attacks.    There were no vitals filed for this visit.  Visit Diagnosis:  Abnormality of gait  Unsteadiness  Balance problems  Decreased functional activity tolerance  Functional weakness  Encounter for prosthetic gait training     06/29/15 1549  Transfers  Sit to Stand 6: Modified independent (Device/Increase time);With upper extremity assist;With armrests;From chair/3-in-1  Stand to Sit 6: Modified  independent (Device/Increase time);With upper extremity assist;With armrests;To chair/3-in-1  Ambulation/Gait  Ambulation/Gait Yes  Ambulation/Gait Assistance 5: Supervision;4: Min guard  Ambulation/Gait Assistance Details cues on posture, weight shift and equal step length  Ambulation Distance (Feet) 1000 Feet (x1 w/cane;)  Assistive device Straight cane;Prosthesis  Gait Pattern Step-through pattern;Decreased step length - left;Decreased stance time - right;Decreased stride length;Decreased hip/knee flexion - right;Trunk flexed;Abducted- right;Poor foot clearance - right  Ambulation Surface Level;Unlevel;Indoor;Outdoor;Paved;Grass  Stairs Yes  Stairs Assistance 5: Supervision  Stairs Assistance Details (indicate cue type and reason) cues on prosthetic foot placement with descending stairs and to advance hands with stair negotaition  Stair Management Technique Two rails;Alternating pattern;Forwards  Number of Stairs 4 (x 2 reps)  Ramp 5: Supervision (prosthesis only)  Ramp Details (indicate cue type and reason) cues on  technique/weight shift  Curb 5: Supervision  Curb Details (indicate cue type and reason) cues on sequence  Prosthetics  Prosthetic Care Comments  apt made with Merry Proud (prosthetist) to have medial tibial pad ajusted, ? need for condyl pad's and need prosthesis made higher for equal leg lenght.  Current prosthetic wear tolerance (days/week)  daily  Current prosthetic wear tolerance (#hours/day)  >90% of awake hours  Residual limb condition  now with red area on medial aspect next to tibia, rash appearance. Most likley being caused by pad in socket being too distal and increasing pressure here. pt does report having pain here with prolonged wear of prosthesis as well.  Education Provided Residual limb care;Correct ply sock adjustment;Proper weight-bearing schedule/adjustment  Person(s) Educated Patient  Education Method Explanation;Demonstration;Verbal cues  Education Method Verbalized understanding  Donning Prosthesis 6  Doffing Prosthesis 6          PT Short Term Goals - 06/13/15 1315    PT SHORT TERM GOAL #1   Title Patient verbalizes proper prosthetic care with minimal cues for problem solving. (Target Date: 06/10/2015)   Status Achieved   PT SHORT TERM GOAL #2   Title Patient tolerates >12 hrs/day without negative change in skin issues and no tenderness to limb.  (Target Date: 06/10/2015)   Baseline 06/13/2015 Patient reports wear 11hours /day over the weekend with no skin issues but 5/10 limb pain. He is scheduled to see prosthetist 06/14/15 for adjustments.   Status Not Met   PT SHORT TERM GOAL #3   Title Patient ambulates 250' with single point cane & prosthesis with cues for gait deviations only no physical assist needed.  (Target Date: 06/10/2015)   Baseline met before 2/30/16   Status Achieved   PT SHORT TERM GOAL #4   Title Patient negotiates ramps, curbs & stairs (1 rail) with single point cane & prosthesis with supervision.  (Target Date: 06/10/2015)   Baseline met before 06/10/15    Status Achieved   PT SHORT TERM GOAL #5   Title Berg Balance >45/56  (Target Date: 06/10/2015)   Baseline 45/56 on 06/10/15   Status Achieved           PT Long Term Goals - 05/12/15 1315    PT LONG TERM GOAL #1   Title Patient verbalizes and demonstrates proper prosthetic care to enable safe use of prosthesis.  (Target Date: 07/08/2015)   Time 8   Period Weeks   Status New   PT LONG TERM GOAL #2   Title Patient tolerates wear of prosthesis >90% of awake hours with no skin issues or tenderness to enable functional use throughout his day.  (Target Date: 07/08/2015)   Time  8   Period Weeks   Status New   PT LONG TERM GOAL #3   Title Patient reports chronic pain similar to prior level with increase in pain </= 2 increments with standing & gait activities.  (Target Date: 07/08/2015)   Time 8   Period Weeks   Status New   PT LONG TERM GOAL #4   Title Patient self-reports >20% increase in functional status using FOTO.  (Target Date: 07/08/2015)   Time 8   Period Weeks   Status New   PT LONG TERM GOAL #5   Title Patient ambulates >500' including grass, ramps, curbs & stairs (1 rail) with single point cane or less and prosthesis modified independent.  (Target Date: 07/08/2015)   Time 8   Period Weeks   Status New   Additional Long Term Goals   Additional Long Term Goals Yes   PT LONG TERM GOAL #6   Title Patient ambulates 100' with prosthesis only carrying 15# objects around furniture indpendently.  (Target Date: 07/08/2015)   Time 8   Period Weeks   Status New        06/29/15 1548  Plan  Clinical Impression Statement Pt to see prosthetist to have additional ajustments made. Pt is making steady progress toward goals.  Pt will benefit from skilled therapeutic intervention in order to improve on the following deficits Abnormal gait;Decreased activity tolerance;Decreased balance;Decreased endurance;Decreased mobility;Decreased range of motion;Decreased knowledge of use of  DME;Decreased strength;Postural dysfunction;Prosthetic Dependency  Rehab Potential Good  PT Frequency 2x / week  PT Duration 8 weeks  PT Treatment/Interventions ADLs/Self Care Home Management;DME Instruction;Gait training;Stair training;Functional mobility training;Therapeutic activities;Therapeutic exercise;Balance training;Neuromuscular re-education;Patient/family education;Prosthetic Training  PT Next Visit Plan continue with household gait without device & community gait with cane, balance and strengthening  Consulted and Agree with Plan of Care Patient     Problem List Patient Active Problem List   Diagnosis Date Noted  . Sepsis ([place]) 01/14/2015  . Opacity of lung on imaging study 01/14/2015  . Angina pectoris associated with type 2 diabetes mellitus ([place]) 10/28/2014  . Wound infection ([place]) 09/26/2014  . Diabetes mellitus type 2, controlled ([place]) 08/25/2014  . Essential hypertension 08/25/2014  . Hyperlipidemia 08/25/2014  . 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 Foot ulcer, left ([place]) 11/24/2012  . Chronic total occlusion of artery of the extremities ([place]) 04/08/2012  . Cellulitis 03/19/2012  . PAD (peripheral artery disease) ([place]) 02/26/2012  . Dizziness 02/26/2012  . Onychomycosis 02/01/2012  . Diabetes mellitus ([place]) 11/01/2011  . Occlusion and stenosis of carotid artery without mention of cerebral infarction 06/12/2011  . Aftercare following surgery of the circulatory system, [place] 06/12/2011  . GERD (gastroesophageal reflux disease) 05/08/2011  . Barrett's esophagus without dysplasia 05/08/2011  . PVD (peripheral vascular disease) ([place]) 02/01/2011  . HTN (hypertension) 02/01/2011  . Tobacco abuse 02/01/2011    Willow Ora 07/01/2015, 8:33 AM  Willow Ora, PTA, [facility] [address] [place], [place] [address] [phone] 07/01/2015, 8:34 AM   Name: [name] MRN: [id number] Date of  Birth: 08/30/1956

## 2015-07-04 ENCOUNTER — Ambulatory Visit: Payer: BLUE CROSS/BLUE SHIELD | Admitting: Physical Therapy

## 2015-07-04 ENCOUNTER — Encounter: Payer: Self-pay | Admitting: Physical Therapy

## 2015-07-04 DIAGNOSIS — R531 Weakness: Secondary | ICD-10-CM

## 2015-07-04 DIAGNOSIS — R269 Unspecified abnormalities of gait and mobility: Secondary | ICD-10-CM

## 2015-07-04 DIAGNOSIS — R2689 Other abnormalities of gait and mobility: Secondary | ICD-10-CM

## 2015-07-04 DIAGNOSIS — R6889 Other general symptoms and signs: Secondary | ICD-10-CM

## 2015-07-04 DIAGNOSIS — R2681 Unsteadiness on feet: Secondary | ICD-10-CM

## 2015-07-04 DIAGNOSIS — Z4789 Encounter for other orthopedic aftercare: Secondary | ICD-10-CM

## 2015-07-04 NOTE — Therapy (Signed)
[facility] [address] [place], [place], [address] Phone: [phone]   Fax:  [phone]  Physical Therapy Treatment  Patient Details  Name: [name] MRN: [id number] Date of Birth: Aug 12, 1956 No Data Recorded  Encounter Date: 07/04/2015      PT End of Session - 07/04/15 1626    Visit Number 14   Number of Visits 17   Date for PT Re-Evaluation 07/08/15   Authorization Type Medicare 2nd G-code every 10th visit   PT Start Time 1457   PT Stop Time 1535   PT Time Calculation (min) 38 min   Equipment Utilized During Treatment Gait belt   Activity Tolerance Patient tolerated treatment well   Behavior During Therapy [facility] for tasks assessed/performed      Past Medical History  Diagnosis Date  . Carotid artery occlusion 11/10/10    LEFT CAROTID ENDARTERECTOMY  . Diabetes mellitus   . Hypertension   . Hyperlipidemia   . COPD (chronic obstructive pulmonary disease) ([place])   . DJD (degenerative joint disease)   . GERD (gastroesophageal reflux disease)   . Substernal chest pain   . 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 Slurred speech     AS PER WIFE IN D/C NOTE 11/10/10  . PAD (peripheral artery disease) ([place])     Distal aortogram June 2012. Atherectomy left popliteal artery July 2012.   . Barrett's esophagus   . Fatty liver   . Diverticulosis of colon (without mention of hemorrhage)   . Emphysema of lung ([place])   . Osteomyelitis ([place])   . Complication of anesthesia     BP WENT UP AT [facility] "  . Sleep apnea     has not used CPAP in e years - took self off  . H/O hiatal hernia   . Neuromuscular disorder ([place])     peripheral neuropathy  . Full dentures   . Wears glasses   . CHF (congestive heart failure) ([place])   . History of echocardiogram 08/22/2011    Mildly hypertrophic left ventricle with normal systolic function and mild diastolic dysfunction. mildly dilated left atrium. no  significant valvular abnormalities. no pericardial effusion no change from previous study 10/2010. the ejection fraction =>55%  . History of cardiovascular stress test 03/23/2011    ejection fraction measured 51%, with an end-diastolic volume of 326 ml and an end-systolic volume of 81 ml. no evidence of myocardial ischemia.  . H/O Doppler ultrasound 06/28/2012    Lower venous duplex left-- findings consistent with acute deep vein thrombosis involving the personal vein of the left lower extremity. No evidence of Baker's cyst on the left. Duplex scan of the left lower extremity arteries reveal adequate blood flow.   . History of Holter monitoring 02/14/2012    Scan quality is fair due to baseline artifact. Predominant rhythm is Sinus. Veentricular Ectopy was noted in single beat form. The total recording time was 24:00 hours. total beats =[id number]. the total time analyzed was 23:58 hours. Heart  Max = 102 at 1:18:22, Min= 52 at 9:56:19 am., Avg= 70. There were 0 pause(s) > 2.5 seconds. the longest pause = 0.00 sec. at 1:51 pm(1)  . OSA (obstructive sleep apnea) 12/26/08    AHI of 6.3/hr overall, 32.7/hr during REM sleep and an RDI of 24.8/hr and 50.9/hr.    Past Surgical History  Procedure Laterality Date  . Laminectomy      X 3 LUMBAR  AND X 2 CERVICAL SPINE OPERATIONS  . Carotid endarterectomy  11/10/10  . Hernia repair      LEFT INGUINAL AND UMBILICAL REPAIRS  . Anterior fusion cervical spine  02/06/06    C4-5, C5-6, C6-7; SURGEON DR. MAX COHEN  . Penile prosthesis implant  08/14/05    INFRAPUBIC INSERTION OF INFLATABLE PENILE PROSTHESIS; SURGEON DR. Amalia Hailey  . Laparoscopic cholecystectomy w/ cholangiography  11/09/04    SURGEON DR. Luella Cook  . Total knee arthroplasty  07/2002    RIGHT KNEE ; SURGEON  DR. [name] ALSO HAD ARTHROSCOPIC RIGHT KNEE IN  10/2001  . Shoulder arthroscopy    . Cholecystectomy    . Amputation  11/05/2011    Procedure: AMPUTATION RAY;  Surgeon: Wylene Simmer, MD;  Location: [facility];  Service: Orthopedics;  Laterality: Right;  Amputation of Right 4&5th Toes  . Femoral artery stent      x6  . Amputation Left 11/26/2012    Procedure: AMPUTATION RAY;  Surgeon: Wylene Simmer, MD;  Location: [facility];  Service: Orthopedics;  Laterality: Left;  fourth ray amputation  . Cardiac catheterization  10/31/04    2009  . Carpal tunnel release Right 10/21/2013    Procedure: RIGHT CARPAL TUNNEL RELEASE;  Surgeon: Wynonia Sours, MD;  Location: [facility];  Service: Orthopedics;  Laterality: Right;  . Ulnar nerve transposition Right 10/21/2013    Procedure: RIGHT ELBOW  ULNAR NERVE DECOMPRESSION;  Surgeon: Wynonia Sours, MD;  Location: [facility];  Service: Orthopedics;  Laterality: Right;  . Joint replacement Right 2001    Total  . Spine surgery    . Neck surgery    . Esophageal manometry Bilateral 07/19/2014    Procedure: ESOPHAGEAL MANOMETRY (EM);  Surgeon: Jerene Bears, MD;  Location: [facility];  Service: Gastroenterology;  Laterality: Bilateral;  . Lower extremity angiogram N/A 03/19/2012    Procedure: LOWER EXTREMITY ANGIOGRAM;  Surgeon: Burnell Blanks, MD;  Location[facility] C[facility] LAB;  Service: Cardiovascular;  Laterality: N/A;  . Amputation Right 08/27/2014    Procedure: Transmetatarsal Amputation;  Surgeon: Newt Minion, MD;  Location: [facility];  Service: Orthopedics;  Laterality: Right;  . Left heart catheterization with coronary angiogram N/A 10/29/2014    Procedure: LEFT HEART CATHETERIZATION WITH CORONARY ANGIOGRAM;  Surgeon: Laverda Page, MD;  Location: [facility] CATH LAB;  Service: Cardiovascular;  Laterality: N/A;  . Percutaneous coronary stent intervention (pci-s) Right 10/29/2014    Procedure: PERCUTANEOUS CORONARY STENT INTERVENTION (PCI-S);  Surgeon: Laverda Page, MD;  Location: [facility] CATH LAB;  Service: Cardiovascular;  Laterality: Right;  . Amputation Right 01/14/2015    Procedure: AMPUTATION BELOW KNEE;  Surgeon: Newt Minion, MD;  Location:  [facility];  Service: Orthopedics;  Laterality: Right;  . Carotid endarterectomy Left 11/10/2010    Subtotal occlusion of left internal carotid artery with left hemispheric transient ischemic attacks.    There were no vitals filed for this visit.  Visit Diagnosis:  Abnormality of gait  Unsteadiness  Balance problems  Decreased functional activity tolerance  Functional weakness  Encounter for prosthetic gait training      Subjective Assessment - 07/04/15 1550    Subjective Pt. states, "I don't feel any pain in my R leg, but my lower back is a 8/10 pain."   Pain Score 8    Pain Location Back   Pain Orientation Lower   Pain Descriptors / Indicators Burning   Pain Type Chronic pain  [place] Adult PT Treatment/Exercise - 07/04/15 1555    Transfers   Transfers Sit to Stand;Stand to Sit   Sit to Stand 4: Min guard   Stand to Sit 4: Min guard   Transfer Cueing Occasional VC for pacing to decrease speed and increase safety   Comments Transfers performed fast without LOB   Ambulation/Gait   Ambulation/Gait Assistance 4: Min guard   Ambulation/Gait Assistance Details cues for pacing    Ambulation Distance (Feet) 600 x1;blocked practice of 100 feet carrying objects.   Assistive device Straight cane   Gait Pattern Step-through pattern   Ambulation Surface Level   Stairs Yes   Stairs Assistance 4: Min guard   Stair Management Technique One rail Right   Number of Stairs 4   Ramp 5: Supervision   Ramp Details (indicate cue type and reason) Cues to decrease pace   Curb 5: Supervision   Gait Comments no increase in pain reported during ambulation    Exercises   Exercises Knee/Hip   Knee/Hip Exercises: Stretches   Gastroc Stretch 20 seconds   Knee/Hip Exercises: Seated   Long Arc Quad 2 sets   Clamshell with [employer]   Prosthetics   Current prosthetic wear tolerance (days/week)  daily   Current prosthetic wear tolerance (#hours/day)  >90% of awake hours   Residual  limb condition  intact with no issues   Person(s) Educated Patient   Education Method Explanation   Education Method Verbalized understanding   Donning Prosthesis Modified independent (device/increased time)   Doffing Prosthesis Modified independent (device/increased time)     carrying cup of water 100 feet with cane, 5# weight with cane, 15# crate with and without cane,  Supervision to min assist for balance recovery at end after putting crate down due to pt tripping over rotated prosthesis.        PT Short Term Goals - 06/13/15 1315    PT SHORT TERM GOAL #1   Title Patient verbalizes proper prosthetic care with minimal cues for problem solving. (Target Date: 06/10/2015)   Status Achieved   PT SHORT TERM GOAL #2   Title Patient tolerates >12 hrs/day without negative change in skin issues and no tenderness to limb.  (Target Date: 06/10/2015)   Baseline 06/13/2015 Patient reports wear 11hours /day over the weekend with no skin issues but 5/10 limb pain. He is scheduled to see prosthetist 06/14/15 for adjustments.   Status Not Met   PT SHORT TERM GOAL #3   Title Patient ambulates 250' with single point cane & prosthesis with cues for gait deviations only no physical assist needed.  (Target Date: 06/10/2015)   Baseline met before 2/30/16   Status Achieved   PT SHORT TERM GOAL #4   Title Patient negotiates ramps, curbs & stairs (1 rail) with single point cane & prosthesis with supervision.  (Target Date: 06/10/2015)   Baseline met before 06/10/15   Status Achieved   PT SHORT TERM GOAL #5   Title Berg Balance >45/56  (Target Date: 06/10/2015)   Baseline 45/56 on 06/10/15   Status Achieved           PT Long Term Goals - 07/04/15 1623    PT LONG TERM GOAL #1   Title Patient verbalizes and demonstrates proper prosthetic care to enable safe use of prosthesis.  (Target Date: 07/08/2015)   Time 8   Period Weeks   Status Achieved   PT LONG TERM GOAL #2   Title Patient tolerates wear of  prosthesis >90% of awake hours with no skin issues or tenderness to enable functional use throughout his day.  (Target Date: 07/08/2015)   Time 8   Period Weeks   Status On-going   PT LONG TERM GOAL #3   Title Patient reports chronic pain similar to prior level with increase in pain </= 2 increments with standing & gait activities.  (Target Date: 07/08/2015)   Time 8   Period Weeks   Status Achieved   PT LONG TERM GOAL #4   Title Patient self-reports >20% increase in functional status using FOTO.  (Target Date: 07/08/2015)   Time 8   Period Weeks   Status On-going   PT LONG TERM GOAL #5   Title Patient ambulates >500' including grass, ramps, curbs & stairs (1 rail) with single point cane or less and prosthesis modified independent.  (Target Date: 07/08/2015)   Time 8   Period Weeks   Status On-going   PT LONG TERM GOAL #6   Title Patient ambulates 100' with prosthesis only carrying 15# objects around furniture indpendently.  (Target Date: 07/08/2015)   Time 8   Period Weeks   Status On-going            Plan - 07/04/15 1628    Clinical Impression Statement Pt now has lateral condyle pad added to socket after seeing prosthetist. Reports his prosthesis now externally rotate all the time despite sock ply on. Has increased up to 16 ply with snug fit intitaly and still rotates.Has apt on Thursday to see Merry Proud again.  Due to new issues with prosthesis pt. Needing increased assist for balance. Onc significant loss of balance in session today and pt reports several of these episodes at home. Pt advised to consistently use his cane or crutches until he see's Merry Proud to have this issue resolved. Pt verbalized understanding and agreed.     Pt will benefit from skilled therapeutic intervention in order to improve on the following deficits Abnormal gait;Decreased activity tolerance;Decreased balance;Decreased endurance;Decreased mobility;Decreased range of motion;Decreased knowledge of use of  DME;Decreased strength;Postural dysfunction;Prosthetic Dependency   Rehab Potential Good   PT Frequency 2x / week   PT Duration 8 weeks   PT Treatment/Interventions ADLs/Self Care Home Management;DME Instruction;Gait training;Stair training;Functional mobility training;Therapeutic activities;Therapeutic exercise;Balance training;Neuromuscular re-education;Patient/family education;Prosthetic Training   PT Next Visit Plan assess remaining LTG's.   Consulted and Agree with Plan of Care Patient        Problem List Patient Active Problem List   Diagnosis Date Noted  . Sepsis ([place]) 01/14/2015  . Opacity of lung on imaging study 01/14/2015  . Angina pectoris associated with type 2 diabetes mellitus ([place]) 10/28/2014  . Wound infection ([place]) 09/26/2014  . Diabetes mellitus type 2, controlled ([place]) 08/25/2014  . Essential hypertension 08/25/2014  . Hyperlipidemia 08/25/2014  . 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 Foot ulcer, left ([place]) 11/24/2012  . Chronic total occlusion of artery of the extremities ([place]) 04/08/2012  . Cellulitis 03/19/2012  . PAD (peripheral artery disease) ([place]) 02/26/2012  . Dizziness 02/26/2012  . Onychomycosis 02/01/2012  . Diabetes mellitus ([place]) 11/01/2011  . Occlusion and stenosis of carotid artery without mention of cerebral infarction 06/12/2011  . Aftercare following surgery of the circulatory system, [place] 06/12/2011  . GERD (gastroesophageal reflux disease) 05/08/2011  . Barrett's esophagus without dysplasia 05/08/2011  . PVD (peripheral vascular disease) ([place]) 02/01/2011  . HTN (hypertension) 02/01/2011  . Tobacco abuse 02/01/2011    Savan Ruta  Sharlet [place]ina 07/04/2015, 4:32 PM  Bess Harvest, [facility]  Name: ABEL HAGEMAN MRN: [id number] Date of Birth: 1955/11/08  This note has been reviewed and edited by supervision CI.  Willow Ora, PTA, [facility] [address], [place], [place]  [id number] [phone] 07/04/2015, 10:01 PM

## 2015-07-04 NOTE — Patient Instructions (Signed)
Pt. Instructed x 3 on proper pacing to decrease transfer and ambulation speed to increase safety.

## 2015-07-07 ENCOUNTER — Ambulatory Visit: Payer: BLUE CROSS/BLUE SHIELD | Admitting: Physical Therapy

## 2015-07-11 ENCOUNTER — Encounter: Payer: Self-pay | Admitting: Physical Therapy

## 2015-07-11 ENCOUNTER — Ambulatory Visit: Payer: BLUE CROSS/BLUE SHIELD | Admitting: Physical Therapy

## 2015-07-11 DIAGNOSIS — R2689 Other abnormalities of gait and mobility: Secondary | ICD-10-CM

## 2015-07-11 DIAGNOSIS — Z4789 Encounter for other orthopedic aftercare: Secondary | ICD-10-CM

## 2015-07-11 DIAGNOSIS — R269 Unspecified abnormalities of gait and mobility: Secondary | ICD-10-CM | POA: Diagnosis not present

## 2015-07-11 DIAGNOSIS — Z89511 Acquired absence of right leg below knee: Secondary | ICD-10-CM

## 2015-07-11 DIAGNOSIS — R52 Pain, unspecified: Secondary | ICD-10-CM

## 2015-07-11 DIAGNOSIS — R6889 Other general symptoms and signs: Secondary | ICD-10-CM

## 2015-07-11 DIAGNOSIS — R2681 Unsteadiness on feet: Secondary | ICD-10-CM

## 2015-07-11 NOTE — Therapy (Signed)
[facility] [address] [place], [place], [address] Phone: [phone]   Fax:  [phone]  Physical Therapy Treatment  Patient Details  Name: [name] MRN: [id number] Date of Birth: 1956/09/10 Referring Provider: Meridee Score, MD   PHYSICAL THERAPY DISCHARGE SUMMARY  Visits from Start of Care: 15  Current functional level related to goals / functional outcomes: See below   Remaining deficits: See below   Education / Equipment: Prosthetic care Plan: Patient agrees to discharge.  Patient goals were met. Patient is being discharged due to meeting the stated rehab goals.  ?????       Encounter Date: 07/11/2015      PT End of Session - 07/11/15 1350    Visit Number 15   Number of Visits 17   Date for PT Re-Evaluation 07/08/15   Authorization Type Medicare 2nd G-code every 10th visit   PT Start Time 1250   PT Stop Time 1345   PT Time Calculation (min) 55 min   Equipment Utilized During Treatment Gait belt   Activity Tolerance Patient tolerated treatment well   Behavior During Therapy WFL for tasks assessed/performed      Past Medical History  Diagnosis Date  . Carotid artery occlusion 11/10/10    LEFT CAROTID ENDARTERECTOMY  . Diabetes mellitus   . Hypertension   . Hyperlipidemia   . COPD (chronic obstructive pulmonary disease) ([place])   . DJD (degenerative joint disease)   . GERD (gastroesophageal reflux disease)   . Substernal chest pain   . 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 Slurred speech     AS PER WIFE IN D/C NOTE 11/10/10  . PAD (peripheral artery disease) ([place])     Distal aortogram June 2012. Atherectomy left popliteal artery July 2012.   . Barrett's esophagus   . Fatty liver   . Diverticulosis of colon (without mention of hemorrhage)   . Emphysema of lung ([place])   . Osteomyelitis ([place])   . Complication of anesthesia     BP WENT UP AT [facility] "  . Sleep apnea      has not used CPAP in e years - took self off  . H/O hiatal hernia   . Neuromuscular disorder ([place])     peripheral neuropathy  . Full dentures   . Wears glasses   . CHF (congestive heart failure) ([place])   . History of echocardiogram 08/22/2011    Mildly hypertrophic left ventricle with normal systolic function and mild diastolic dysfunction. mildly dilated left atrium. no significant valvular abnormalities. no pericardial effusion no change from previous study 10/2010. the ejection fraction =>55%  . History of cardiovascular stress test 03/23/2011    ejection fraction measured 51%, with an end-diastolic volume of 992 ml and an end-systolic volume of 81 ml. no evidence of myocardial ischemia.  . H/O Doppler ultrasound 06/28/2012    Lower venous duplex left-- findings consistent with acute deep vein thrombosis involving the personal vein of the left lower extremity. No evidence of Baker's cyst on the left. Duplex scan of the left lower extremity arteries reveal adequate blood flow.   . History of Holter monitoring 02/14/2012    Scan quality is fair due to baseline artifact. Predominant rhythm is Sinus. Veentricular Ectopy was noted in single beat form. The total recording time was 24:00 hours. total beats =[id number]. the total time analyzed was 23:58 hours. Heart  Max = 102 at 1:18:22, Min=  52 at 9:56:19 am., Avg= 70. There were 0 pause(s) > 2.5 seconds. the longest pause = 0.00 sec. at 1:51 pm(1)  . OSA (obstructive sleep apnea) 12/26/08    AHI of 6.3/hr overall, 32.7/hr during REM sleep and an RDI of 24.8/hr and 50.9/hr.    Past Surgical History  Procedure Laterality Date  . Laminectomy      X 3 LUMBAR AND X 2 CERVICAL SPINE OPERATIONS  . Carotid endarterectomy  11/10/10  . Hernia repair      LEFT INGUINAL AND UMBILICAL REPAIRS  . Anterior fusion cervical spine  02/06/06    C4-5, C5-6, C6-7; SURGEON DR. MAX COHEN  . Penile prosthesis implant  08/14/05    INFRAPUBIC INSERTION OF INFLATABLE PENILE  PROSTHESIS; SURGEON DR. Amalia Hailey  . Laparoscopic cholecystectomy w/ cholangiography  11/09/04    SURGEON DR. Luella Cook  . Total knee arthroplasty  07/2002    RIGHT KNEE ; SURGEON  DR. [name] ALSO HAD ARTHROSCOPIC RIGHT KNEE IN  10/2001  . Shoulder arthroscopy    . Cholecystectomy    . Amputation  11/05/2011    Procedure: AMPUTATION RAY;  Surgeon: Wylene Simmer, MD;  Location: [facility];  Service: Orthopedics;  Laterality: Right;  Amputation of Right 4&5th Toes  . Femoral artery stent      x6  . Amputation Left 11/26/2012    Procedure: AMPUTATION RAY;  Surgeon: Wylene Simmer, MD;  Location: [facility];  Service: Orthopedics;  Laterality: Left;  fourth ray amputation  . Cardiac catheterization  10/31/04    2009  . Carpal tunnel release Right 10/21/2013    Procedure: RIGHT CARPAL TUNNEL RELEASE;  Surgeon: Wynonia Sours, MD;  Location: [facility];  Service: Orthopedics;  Laterality: Right;  . Ulnar nerve transposition Right 10/21/2013    Procedure: RIGHT ELBOW  ULNAR NERVE DECOMPRESSION;  Surgeon: Wynonia Sours, MD;  Location: [facility];  Service: Orthopedics;  Laterality: Right;  . Joint replacement Right 2001    Total  . Spine surgery    . Neck surgery    . Esophageal manometry Bilateral 07/19/2014    Procedure: ESOPHAGEAL MANOMETRY (EM);  Surgeon: Jerene Bears, MD;  Location: [facility];  Service: Gastroenterology;  Laterality: Bilateral;  . Lower extremity angiogram N/A 03/19/2012    Procedure: LOWER EXTREMITY ANGIOGRAM;  Surgeon: Burnell Blanks, MD;  Location: [facility] CATH LAB;  Service: Cardiovascular;  Laterality: N/A;  . Amputation Right 08/27/2014    Procedure: Transmetatarsal Amputation;  Surgeon: Newt Minion, MD;  Location: [facility];  Service: Orthopedics;  Laterality: Right;  . Left heart catheterization with coronary angiogram N/A 10/29/2014    Procedure: LEFT HEART CATHETERIZATION WITH CORONARY ANGIOGRAM;  Surgeon: Laverda Page, MD;  Location[facility] C[facility] LAB;   Service: Cardiovascular;  Laterality: N/A;  . Percutaneous coronary stent intervention (pci-s) Right 10/29/2014    Procedure: PERCUTANEOUS CORONARY STENT INTERVENTION (PCI-S);  Surgeon: Laverda Page, MD;  Location: [facility] CATH LAB;  Service: Cardiovascular;  Laterality: Right;  . Amputation Right 01/14/2015    Procedure: AMPUTATION BELOW KNEE;  Surgeon: Newt Minion, MD;  Location: [facility];  Service: Orthopedics;  Laterality: Right;  . Carotid endarterectomy Left 11/10/2010    Subtotal occlusion of left internal carotid artery with left hemispheric transient ischemic attacks.    There were no vitals filed for this visit.  Visit Diagnosis:  Abnormality of gait  Unsteadiness  Balance problems  Decreased functional activity tolerance  Encounter for prosthetic gait  training  Status post below knee amputation of right lower extremity (HCC)  Pain, generalized      Subjective Assessment - 07/11/15 1345    Subjective Prosthetist trimmed pad back and added 2 more to help fit and stop rotation due to amount of shrinkage of his limb. He reports no falls but near falls when the prsothesis rotates on his limb.   Currently in Pain? Yes   Pain Score 6    Pain Location Back   Pain Orientation Lower   Pain Descriptors / Indicators Aching   Pain Type Chronic pain   Pain Onset More than a month ago   Pain Frequency Constant   Aggravating Factors  chronic pain, activities including gait did not increase today            [facility] PT Assessment - 07/11/15 1250    Assessment   Referring Provider Meridee Score, MD   Observation/Other Assessments   Focus on Therapeutic Outcomes (FOTO)  59 Functional Status  Initial was 23   Fear Avoidance Belief Questionnaire (FABQ)  39% initial was 63% (decrease indicates improvement)         Prosthetics Assessment - 07/11/15 1250    Prosthetics   Prosthetic Care Independent with Skin check;Prosthetic cleaning;Residual limb care;Care of non-amputated  limb;Correct ply sock adjustment;Ply sock cleaning;Proper wear schedule/adjustment;Proper weight-bearing schedule/adjustment   Prosthetic Care Comments  Patient's residual limb appears to be decreasing volume more rapidly due to fluid retention prior to prosthetic fitting and significantly increasing activity level.    Donning prosthesis  Independent   Doffing prosthesis  Independent   Current prosthetic wear tolerance (days/week)  7 days /wk   Current prosthetic wear tolerance (#hours/day)  >90% of awake hours   Current prosthetic weight-bearing tolerance (hours/day)  tolerates >15 minutes of standing / gait with chronic pain increasing or any limb pain.   Residual limb condition  no open areas or issues.   K code/activity level with prosthetic use  3                    [facility] Adult PT Treatment/Exercise - 07/11/15 1250    Transfers   Transfers Sit to Stand;Stand to Sit   Sit to Stand 6: Modified independent (Device/Increase time);With upper extremity assist;From chair/3-in-1  does not to touch to stabilize   Stand to Sit 6: Modified independent (Device/Increase time);With upper extremity assist;To chair/3-in-1   Ambulation/Gait   Ambulation/Gait Yes   Ambulation/Gait Assistance 6: Modified independent (Device/Increase time)   Ambulation Distance (Feet) 920 Feet  920' inside & outside w/ cane, 120' inside no device   Assistive device Prosthesis;Straight cane;None   Gait Pattern Step-through pattern   Ambulation Surface Indoor;Level;Outdoor;Paved;Gravel;Grass   Stairs Yes   Stairs Assistance 6: Modified independent (Device/Increase time)   Stair Management Technique One rail Right;With cane   Ramp 6: Modified independent (Device)  cane & prosthesis   Curb 6: Modified independent (Device/increase time)  cane & prosthesis   Berg Balance Test   Sit to Stand Able to stand without using hands and stabilize independently   Standing Unsupported Able to stand safely 2 minutes    Sitting with Back Unsupported but Feet Supported on Floor or Stool Able to sit safely and securely 2 minutes   Stand to Sit Sits safely with minimal use of hands   Transfers Able to transfer safely, minor use of hands   Standing Unsupported with Eyes Closed Able to stand 10 seconds safely   Standing Ubsupported  with Feet Together Able to place feet together independently and stand 1 minute safely   From Standing, Reach Forward with Outstretched Arm Can reach confidently >25 cm (10")   From Standing Position, Pick up Object from [place] to pick up shoe safely and easily   From Standing Position, Turn to Look Behind Over each Shoulder Looks behind from both sides and weight shifts well   Turn 360 Degrees Able to turn 360 degrees safely but slowly   Standing Unsupported, Alternately Place Feet on Step/Stool Able to complete 4 steps without aid or supervision   Standing Unsupported, One Foot in [place] to plae foot ahead of the other independently and hold 30 seconds   Standing on One Leg Able to lift leg independently and hold equal to or more than 3 seconds   Total Score 49                PT Education - 07/11/15 1350    Education provided Yes   Education Details ongoing follow-up with prosthetist   Person(s) Educated Patient   Methods Explanation   Comprehension Verbalized understanding          PT Short Term Goals - 06/13/15 1315    PT SHORT TERM GOAL #1   Title Patient verbalizes proper prosthetic care with minimal cues for problem solving. (Target Date: 06/10/2015)   Status Achieved   PT SHORT TERM GOAL #2   Title Patient tolerates >12 hrs/day without negative change in skin issues and no tenderness to limb.  (Target Date: 06/10/2015)   Baseline 06/13/2015 Patient reports wear 11hours /day over the weekend with no skin issues but 5/10 limb pain. He is scheduled to see prosthetist 06/14/15 for adjustments.   Status Not Met   PT SHORT TERM GOAL #3   Title Patient  ambulates 250' with single point cane & prosthesis with cues for gait deviations only no physical assist needed.  (Target Date: 06/10/2015)   Baseline met before 2/30/16   Status Achieved   PT SHORT TERM GOAL #4   Title Patient negotiates ramps, curbs & stairs (1 rail) with single point cane & prosthesis with supervision.  (Target Date: 06/10/2015)   Baseline met before 06/10/15   Status Achieved   PT SHORT TERM GOAL #5   Title Berg Balance >45/56  (Target Date: 06/10/2015)   Baseline 45/56 on 06/10/15   Status Achieved           PT Long Term Goals - 07/11/15 1327    PT LONG TERM GOAL #1   Title Patient verbalizes and demonstrates proper prosthetic care to enable safe use of prosthesis.  (Target Date: 07/08/2015)   Baseline MET 07/11/2015   Time 8   Period Weeks   Status Achieved   PT LONG TERM GOAL #2   Title Patient tolerates wear of prosthesis >90% of awake hours with no skin issues or tenderness to enable functional use throughout his day.  (Target Date: 07/08/2015)   Baseline MET 07/11/2015   Time 8   Period Weeks   Status Achieved   PT LONG TERM GOAL #3   Title Patient reports chronic pain similar to prior level with increase in pain </= 2 increments with standing & gait activities.  (Target Date: 07/08/2015)   Baseline MET 07/11/2015  Patient reported pain 6/10 at beginning and end of session.    Time 8   Period Weeks   Status Achieved   PT LONG TERM GOAL #4   Title  Patient self-reports >20% increase in functional status using FOTO.  (Target Date: 07/08/2015)   Baseline MET 07/11/2015 Functional Status increased 26%   Time 8   Period Weeks   Status On-going   PT LONG TERM GOAL #5   Title Patient ambulates >500' including grass, ramps, curbs & stairs (1 rail) with single point cane or less and prosthesis modified independent.  (Target Date: 07/08/2015)   Baseline MET 07/11/2015   Time 8   Period Weeks   Status Achieved   PT LONG TERM GOAL #6   Title Patient ambulates  100' with prosthesis only carrying 15# objects around furniture indpendently.  (Target Date: 07/08/2015)   Baseline MET 07/11/2015   Time 8   Period Weeks   Status Achieved               Plan - 07/11/15 1350    Clinical Impression Statement Patient met all LTGs. His residual limb appears to be reducing volume with quicker rate than typical. He has been able to increase his activity level with the prosthesis. He is functioning at full community level with variable cadence.    Pt will benefit from skilled therapeutic intervention in order to improve on the following deficits Abnormal gait;Decreased activity tolerance;Decreased balance;Decreased endurance;Decreased mobility;Decreased range of motion;Decreased knowledge of use of DME;Decreased strength;Postural dysfunction;Prosthetic Dependency   Rehab Potential Good   PT Frequency 2x / week   PT Duration 8 weeks   PT Treatment/Interventions ADLs/Self Care Home Management;DME Instruction;Gait training;Stair training;Functional mobility training;Therapeutic activities;Therapeutic exercise;Balance training;Neuromuscular re-education;Patient/family education;Prosthetic Training   PT Next Visit Plan Discharge PT    Consulted and Agree with Plan of Care Patient        Problem List Patient Active Problem List   Diagnosis Date Noted  . Sepsis ([place]) 01/14/2015  . Opacity of lung on imaging study 01/14/2015  . Angina pectoris associated with type 2 diabetes mellitus ([place]) 10/28/2014  . Wound infection ([place]) 09/26/2014  . Diabetes mellitus type 2, controlled ([place]) 08/25/2014  . Essential hypertension 08/25/2014  . Hyperlipidemia 08/25/2014  . 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 Foot ulcer, left ([place]) 11/24/2012  . Chronic total occlusion of artery of the extremities ([place]) 04/08/2012  . Cellulitis 03/19/2012  . PAD (peripheral artery disease) ([place]) 02/26/2012  . Dizziness 02/26/2012  .  Onychomycosis 02/01/2012  . Diabetes mellitus ([place]) 11/01/2011  . Occlusion and stenosis of carotid artery without mention of cerebral infarction 06/12/2011  . Aftercare following surgery of the circulatory system, [place] 06/12/2011  . GERD (gastroesophageal reflux disease) 05/08/2011  . Barrett's esophagus without dysplasia 05/08/2011  . PVD (peripheral vascular disease) ([place]) 02/01/2011  . HTN (hypertension) 02/01/2011  . Tobacco abuse 02/01/2011    Jamey Reas PT, DPT 07/11/2015, 5:41 PM  [facility] [address] [place], [place], [address] Phone: [phone]   Fax:  [phone]  Name: SIERRA BISSONETTE MRN: [id number] Date of Birth: Sep 30, 1955

## 2015-07-15 ENCOUNTER — Encounter: Payer: BLUE CROSS/BLUE SHIELD | Admitting: Physical Therapy

## 2015-09-03 ENCOUNTER — Inpatient Hospital Stay (HOSPITAL_COMMUNITY): Payer: BLUE CROSS/BLUE SHIELD

## 2015-09-03 ENCOUNTER — Encounter (HOSPITAL_COMMUNITY): Payer: Self-pay | Admitting: *Deleted

## 2015-09-03 ENCOUNTER — Inpatient Hospital Stay (HOSPITAL_COMMUNITY)
Admission: EM | Admit: 2015-09-03 | Discharge: 2015-09-06 | DRG: 603 | Disposition: A | Discharge status: Home / Self Care | Payer: BLUE CROSS/BLUE SHIELD | Attending: Internal Medicine | Admitting: Internal Medicine

## 2015-09-03 ENCOUNTER — Emergency Department (HOSPITAL_COMMUNITY): Payer: BLUE CROSS/BLUE SHIELD

## 2015-09-03 ENCOUNTER — Observation Stay (HOSPITAL_COMMUNITY): Payer: BLUE CROSS/BLUE SHIELD

## 2015-09-03 DIAGNOSIS — L97529 Non-pressure chronic ulcer of other part of left foot with unspecified severity: Secondary | ICD-10-CM | POA: Diagnosis present

## 2015-09-03 DIAGNOSIS — L03116 Cellulitis of left lower limb: Principal | ICD-10-CM | POA: Diagnosis present

## 2015-09-03 DIAGNOSIS — M1990 Unspecified osteoarthritis, unspecified site: Secondary | ICD-10-CM | POA: Diagnosis present

## 2015-09-03 DIAGNOSIS — Z87891 Personal history of nicotine dependence: Secondary | ICD-10-CM

## 2015-09-03 DIAGNOSIS — I5030 Unspecified diastolic (congestive) heart failure: Secondary | ICD-10-CM | POA: Diagnosis present

## 2015-09-03 DIAGNOSIS — E11621 Type 2 diabetes mellitus with foot ulcer: Secondary | ICD-10-CM | POA: Diagnosis present

## 2015-09-03 DIAGNOSIS — I70244 Atherosclerosis of native arteries of left leg with ulceration of heel and midfoot: Secondary | ICD-10-CM | POA: Diagnosis not present

## 2015-09-03 DIAGNOSIS — E1140 Type 2 diabetes mellitus with diabetic neuropathy, unspecified: Secondary | ICD-10-CM | POA: Diagnosis present

## 2015-09-03 DIAGNOSIS — I5032 Chronic diastolic (congestive) heart failure: Secondary | ICD-10-CM | POA: Diagnosis present

## 2015-09-03 DIAGNOSIS — I2510 Atherosclerotic heart disease of native coronary artery without angina pectoris: Secondary | ICD-10-CM | POA: Diagnosis present

## 2015-09-03 DIAGNOSIS — L97501 Non-pressure chronic ulcer of other part of unspecified foot limited to breakdown of skin: Secondary | ICD-10-CM

## 2015-09-03 DIAGNOSIS — I10 Essential (primary) hypertension: Secondary | ICD-10-CM | POA: Diagnosis present

## 2015-09-03 DIAGNOSIS — Z794 Long term (current) use of insulin: Secondary | ICD-10-CM

## 2015-09-03 DIAGNOSIS — Z89511 Acquired absence of right leg below knee: Secondary | ICD-10-CM

## 2015-09-03 DIAGNOSIS — K219 Gastro-esophageal reflux disease without esophagitis: Secondary | ICD-10-CM | POA: Diagnosis present

## 2015-09-03 DIAGNOSIS — E119 Type 2 diabetes mellitus without complications: Secondary | ICD-10-CM

## 2015-09-03 DIAGNOSIS — Z7902 Long term (current) use of antithrombotics/antiplatelets: Secondary | ICD-10-CM

## 2015-09-03 DIAGNOSIS — R600 Localized edema: Secondary | ICD-10-CM | POA: Diagnosis present

## 2015-09-03 DIAGNOSIS — J449 Chronic obstructive pulmonary disease, unspecified: Secondary | ICD-10-CM | POA: Diagnosis present

## 2015-09-03 DIAGNOSIS — Z96651 Presence of right artificial knee joint: Secondary | ICD-10-CM | POA: Diagnosis present

## 2015-09-03 DIAGNOSIS — Z7982 Long term (current) use of aspirin: Secondary | ICD-10-CM

## 2015-09-03 DIAGNOSIS — I110 Hypertensive heart disease with heart failure: Secondary | ICD-10-CM | POA: Diagnosis present

## 2015-09-03 DIAGNOSIS — E1151 Type 2 diabetes mellitus with diabetic peripheral angiopathy without gangrene: Secondary | ICD-10-CM | POA: Diagnosis present

## 2015-09-03 DIAGNOSIS — N179 Acute kidney failure, unspecified: Secondary | ICD-10-CM | POA: Diagnosis present

## 2015-09-03 DIAGNOSIS — M7989 Other specified soft tissue disorders: Secondary | ICD-10-CM | POA: Diagnosis not present

## 2015-09-03 DIAGNOSIS — M79672 Pain in left foot: Secondary | ICD-10-CM

## 2015-09-03 DIAGNOSIS — E785 Hyperlipidemia, unspecified: Secondary | ICD-10-CM | POA: Diagnosis present

## 2015-09-03 DIAGNOSIS — IMO0001 Reserved for inherently not codable concepts without codable children: Secondary | ICD-10-CM

## 2015-09-03 DIAGNOSIS — L97509 Non-pressure chronic ulcer of other part of unspecified foot with unspecified severity: Secondary | ICD-10-CM

## 2015-09-03 LAB — URINALYSIS, ROUTINE W REFLEX MICROSCOPIC
Bilirubin Urine: NEGATIVE
Glucose, UA: NEGATIVE mg/dL
Hgb urine dipstick: NEGATIVE
Ketones, ur: NEGATIVE mg/dL
Leukocytes, UA: NEGATIVE
Nitrite: NEGATIVE
Protein, ur: NEGATIVE mg/dL
Specific Gravity, Urine: 1.009 (ref 1.005–1.030)
pH: 7.5 (ref 5.0–8.0)

## 2015-09-03 LAB — CBC WITH DIFFERENTIAL/PLATELET
Basophils Absolute: 0 10*3/uL (ref 0.0–0.1)
Basophils Relative: 0 %
Eosinophils Absolute: 0.2 10*3/uL (ref 0.0–0.7)
Eosinophils Relative: 3 %
HCT: 31.7 % — ABNORMAL LOW (ref 39.0–52.0)
Hemoglobin: 11 g/dL — ABNORMAL LOW (ref 13.0–17.0)
Lymphocytes Relative: 19 %
Lymphs Abs: 1.7 10*3/uL (ref 0.7–4.0)
MCH: 28.7 pg (ref 26.0–34.0)
MCHC: 34.7 g/dL (ref 30.0–36.0)
MCV: 82.8 fL (ref 78.0–100.0)
Monocytes Absolute: 0.7 10*3/uL (ref 0.1–1.0)
Monocytes Relative: 8 %
Neutro Abs: 6 10*3/uL (ref 1.7–7.7)
Neutrophils Relative %: 70 %
Platelets: 197 10*3/uL (ref 150–400)
RBC: 3.83 MIL/uL — ABNORMAL LOW (ref 4.22–5.81)
RDW: 13.6 % (ref 11.5–15.5)
WBC: 8.5 10*3/uL (ref 4.0–10.5)

## 2015-09-03 LAB — BASIC METABOLIC PANEL
Anion gap: 10 (ref 5–15)
BUN: 31 mg/dL — ABNORMAL HIGH (ref 6–20)
CO2: 25 mmol/L (ref 22–32)
Calcium: 9.5 mg/dL (ref 8.9–10.3)
Chloride: 103 mmol/L (ref 101–111)
Creatinine, Ser: 1.57 mg/dL — ABNORMAL HIGH (ref 0.61–1.24)
GFR calc Af Amer: 53 mL/min — ABNORMAL LOW (ref >60)
GFR calc non Af Amer: 46 mL/min — ABNORMAL LOW (ref >60)
Glucose, Bld: 155 mg/dL — ABNORMAL HIGH (ref 65–99)
Potassium: 4 mmol/L (ref 3.5–5.1)
Sodium: 138 mmol/L (ref 135–145)

## 2015-09-03 LAB — CREATININE, URINE, RANDOM: Creatinine, Urine: 45.13 mg/dL

## 2015-09-03 LAB — C-REACTIVE PROTEIN: CRP: 1.4 mg/dL — ABNORMAL HIGH (ref <1.0)

## 2015-09-03 LAB — SODIUM, URINE, RANDOM: Sodium, Ur: 78 mmol/L

## 2015-09-03 LAB — GLUCOSE, CAPILLARY
Glucose-Capillary: 120 mg/dL — ABNORMAL HIGH (ref 65–99)
Glucose-Capillary: 339 mg/dL — ABNORMAL HIGH (ref 65–99)
Glucose-Capillary: 125 mg/dL — ABNORMAL HIGH (ref 65–99)
Glucose-Capillary: 102 mg/dL — ABNORMAL HIGH (ref 65–99)

## 2015-09-03 LAB — I-STAT CG4 LACTIC ACID, ED
Lactic Acid, Venous: 1.37 mmol/L (ref 0.5–2.0)
Lactic Acid, Venous: 1.06 mmol/L (ref 0.5–2.0)

## 2015-09-03 LAB — CBG MONITORING, ED: Glucose-Capillary: 137 mg/dL — ABNORMAL HIGH (ref 65–99)

## 2015-09-03 LAB — SEDIMENTATION RATE: Sed Rate: 43 mm/hr — ABNORMAL HIGH (ref 0–16)

## 2015-09-03 MED ORDER — ONDANSETRON HCL 4 MG/2ML IJ SOLN
4.0000 mg | Freq: Once | INTRAMUSCULAR | Status: AC
  Administered 2015-09-03: 4 mg via INTRAVENOUS
  Filled 2015-09-03: qty 2

## 2015-09-03 MED ORDER — ENOXAPARIN SODIUM 40 MG/0.4ML ~~LOC~~ SOLN
40.0000 mg | SUBCUTANEOUS | Status: DC
  Administered 2015-09-03 – 2015-09-06 (×4): 40 mg via SUBCUTANEOUS
  Filled 2015-09-03 (×5): qty 0.4

## 2015-09-03 MED ORDER — INSULIN ASPART 100 UNIT/ML ~~LOC~~ SOLN
5.0000 [IU] | Freq: Three times a day (TID) | SUBCUTANEOUS | Status: DC
  Administered 2015-09-03 – 2015-09-06 (×9): 5 [IU] via SUBCUTANEOUS

## 2015-09-03 MED ORDER — SODIUM CHLORIDE 0.9 % IV SOLN
INTRAVENOUS | Status: DC
  Administered 2015-09-03: 09:00:00 via INTRAVENOUS

## 2015-09-03 MED ORDER — OXYCODONE-ACETAMINOPHEN 5-325 MG PO TABS
1.0000 | ORAL_TABLET | Freq: Three times a day (TID) | ORAL | Status: DC
  Administered 2015-09-03 – 2015-09-06 (×11): 2 via ORAL
  Filled 2015-09-03 (×11): qty 2

## 2015-09-03 MED ORDER — PIPERACILLIN-TAZOBACTAM 3.375 G IVPB
3.3750 g | Freq: Three times a day (TID) | INTRAVENOUS | Status: DC
  Filled 2015-09-03 (×2): qty 50

## 2015-09-03 MED ORDER — VANCOMYCIN HCL 10 G IV SOLR
1250.0000 mg | INTRAVENOUS | Status: DC
  Administered 2015-09-03 – 2015-09-04 (×2): 1250 mg via INTRAVENOUS
  Filled 2015-09-03 (×3): qty 1250

## 2015-09-03 MED ORDER — OMEGA-3-ACID ETHYL ESTERS 1 G PO CAPS
1.0000 g | ORAL_CAPSULE | Freq: Every day | ORAL | Status: DC
  Administered 2015-09-03 – 2015-09-04 (×2): 1 g via ORAL
  Filled 2015-09-03 (×2): qty 1

## 2015-09-03 MED ORDER — ISOSORBIDE MONONITRATE ER 60 MG PO TB24
120.0000 mg | ORAL_TABLET | Freq: Every day | ORAL | Status: DC
  Administered 2015-09-03 – 2015-09-06 (×4): 120 mg via ORAL
  Filled 2015-09-03 (×4): qty 2

## 2015-09-03 MED ORDER — ONDANSETRON HCL 4 MG PO TABS: 4.0000 mg | ORAL_TABLET | Freq: Four times a day (QID) | ORAL | Status: DC | PRN

## 2015-09-03 MED ORDER — INSULIN ASPART 100 UNIT/ML ~~LOC~~ SOLN
10.0000 [IU] | Freq: Three times a day (TID) | SUBCUTANEOUS | Status: DC
  Administered 2015-09-03: 10 [IU] via SUBCUTANEOUS

## 2015-09-03 MED ORDER — GLYBURIDE 2.5 MG PO TABS
2.5000 mg | ORAL_TABLET | Freq: Every day | ORAL | Status: DC
  Administered 2015-09-03: 2.5 mg via ORAL
  Filled 2015-09-03 (×2): qty 1

## 2015-09-03 MED ORDER — SODIUM CHLORIDE 0.9 % IV SOLN: INTRAVENOUS | Status: DC

## 2015-09-03 MED ORDER — FENTANYL 50 MCG/HR TD PT72
50.0000 ug | MEDICATED_PATCH | TRANSDERMAL | Status: DC
  Administered 2015-09-03: 50 ug via TRANSDERMAL
  Filled 2015-09-03 (×3): qty 1

## 2015-09-03 MED ORDER — ATORVASTATIN CALCIUM 40 MG PO TABS
40.0000 mg | ORAL_TABLET | Freq: Every day | ORAL | Status: DC
  Administered 2015-09-03 – 2015-09-06 (×4): 40 mg via ORAL
  Filled 2015-09-03 (×4): qty 1

## 2015-09-03 MED ORDER — GLUCERNA SHAKE PO LIQD
237.0000 mL | Freq: Two times a day (BID) | ORAL | Status: DC
  Administered 2015-09-03 – 2015-09-06 (×7): 237 mL via ORAL

## 2015-09-03 MED ORDER — VANCOMYCIN HCL IN DEXTROSE 1-5 GM/200ML-% IV SOLN
1000.0000 mg | Freq: Once | INTRAVENOUS | Status: AC
  Administered 2015-09-03: 1000 mg via INTRAVENOUS
  Filled 2015-09-03: qty 200

## 2015-09-03 MED ORDER — ROPINIROLE HCL 1 MG PO TABS
2.0000 mg | ORAL_TABLET | Freq: Every day | ORAL | Status: DC
  Administered 2015-09-03 – 2015-09-05 (×3): 2 mg via ORAL
  Filled 2015-09-03 (×3): qty 2

## 2015-09-03 MED ORDER — ALPRAZOLAM 0.25 MG PO TABS
0.2500 mg | ORAL_TABLET | Freq: Three times a day (TID) | ORAL | Status: DC
  Administered 2015-09-03: 0.25 mg via ORAL
  Filled 2015-09-03: qty 1

## 2015-09-03 MED ORDER — ALPRAZOLAM 0.25 MG PO TABS
0.2500 mg | ORAL_TABLET | Freq: Every day | ORAL | Status: DC
  Administered 2015-09-04: 0.25 mg via ORAL
  Filled 2015-09-03: qty 1

## 2015-09-03 MED ORDER — OXYCODONE-ACETAMINOPHEN 5-325 MG PO TABS: 1.0000 | ORAL_TABLET | Freq: Three times a day (TID) | ORAL | Status: DC

## 2015-09-03 MED ORDER — PANTOPRAZOLE SODIUM 40 MG PO TBEC
40.0000 mg | DELAYED_RELEASE_TABLET | Freq: Every day | ORAL | Status: DC
  Administered 2015-09-03 – 2015-09-06 (×4): 40 mg via ORAL
  Filled 2015-09-03 (×4): qty 1

## 2015-09-03 MED ORDER — ADULT MULTIVITAMIN W/MINERALS CH
1.0000 | ORAL_TABLET | Freq: Every day | ORAL | Status: DC
  Administered 2015-09-03 – 2015-09-06 (×4): 1 via ORAL
  Filled 2015-09-03 (×4): qty 1

## 2015-09-03 MED ORDER — PREGABALIN 100 MG PO CAPS
100.0000 mg | ORAL_CAPSULE | Freq: Three times a day (TID) | ORAL | Status: DC
  Administered 2015-09-03 – 2015-09-06 (×10): 100 mg via ORAL
  Filled 2015-09-03 (×10): qty 1

## 2015-09-03 MED ORDER — CLOPIDOGREL BISULFATE 75 MG PO TABS
75.0000 mg | ORAL_TABLET | Freq: Every day | ORAL | Status: DC
  Administered 2015-09-03 – 2015-09-06 (×4): 75 mg via ORAL
  Filled 2015-09-03 (×4): qty 1

## 2015-09-03 MED ORDER — INSULIN ASPART 100 UNIT/ML ~~LOC~~ SOLN: 0.0000 [IU] | Freq: Every day | SUBCUTANEOUS | Status: DC

## 2015-09-03 MED ORDER — ALUM & MAG HYDROXIDE-SIMETH 200-200-20 MG/5ML PO SUSP: 30.0000 mL | Freq: Four times a day (QID) | ORAL | Status: DC | PRN

## 2015-09-03 MED ORDER — CARVEDILOL 25 MG PO TABS
25.0000 mg | ORAL_TABLET | Freq: Every day | ORAL | Status: DC
  Administered 2015-09-03 – 2015-09-05 (×3): 25 mg via ORAL
  Filled 2015-09-03 (×3): qty 1

## 2015-09-03 MED ORDER — INSULIN ASPART 100 UNIT/ML ~~LOC~~ SOLN
0.0000 [IU] | Freq: Three times a day (TID) | SUBCUTANEOUS | Status: DC
  Administered 2015-09-03: 11 [IU] via SUBCUTANEOUS
  Administered 2015-09-04 (×2): 3 [IU] via SUBCUTANEOUS
  Administered 2015-09-04: 2 [IU] via SUBCUTANEOUS
  Administered 2015-09-05: 3 [IU] via SUBCUTANEOUS
  Administered 2015-09-05 – 2015-09-06 (×2): 2 [IU] via SUBCUTANEOUS

## 2015-09-03 MED ORDER — PIPERACILLIN-TAZOBACTAM 3.375 G IVPB
3.3750 g | Freq: Once | INTRAVENOUS | Status: AC
  Administered 2015-09-03: 3.375 g via INTRAVENOUS
  Filled 2015-09-03: qty 50

## 2015-09-03 MED ORDER — SODIUM CHLORIDE 0.9 % IV SOLN
500.0000 mg | Freq: Once | INTRAVENOUS | Status: DC
  Filled 2015-09-03: qty 500

## 2015-09-03 MED ORDER — INSULIN GLARGINE 100 UNIT/ML ~~LOC~~ SOLN
40.0000 [IU] | Freq: Every day | SUBCUTANEOUS | Status: DC
  Administered 2015-09-03 – 2015-09-05 (×3): 40 [IU] via SUBCUTANEOUS
  Filled 2015-09-03 (×4): qty 0.4

## 2015-09-03 MED ORDER — SODIUM CHLORIDE 0.9 % IV BOLUS (SEPSIS)
1000.0000 mL | Freq: Once | INTRAVENOUS | Status: AC
  Administered 2015-09-03: 1000 mL via INTRAVENOUS

## 2015-09-03 MED ORDER — TAMSULOSIN HCL 0.4 MG PO CAPS
0.4000 mg | ORAL_CAPSULE | Freq: Every day | ORAL | Status: DC
  Administered 2015-09-03 – 2015-09-06 (×4): 0.4 mg via ORAL
  Filled 2015-09-03 (×5): qty 1

## 2015-09-03 MED ORDER — EZETIMIBE 10 MG PO TABS
10.0000 mg | ORAL_TABLET | Freq: Every morning | ORAL | Status: DC
  Administered 2015-09-03 – 2015-09-05 (×3): 10 mg via ORAL
  Filled 2015-09-03 (×3): qty 1

## 2015-09-03 MED ORDER — ASPIRIN EC 81 MG PO TBEC
81.0000 mg | DELAYED_RELEASE_TABLET | Freq: Every day | ORAL | Status: DC
  Administered 2015-09-03 – 2015-09-06 (×4): 81 mg via ORAL
  Filled 2015-09-03 (×4): qty 1

## 2015-09-03 MED ORDER — ONDANSETRON HCL 4 MG/2ML IJ SOLN: 4.0000 mg | Freq: Four times a day (QID) | INTRAMUSCULAR | Status: DC | PRN

## 2015-09-03 MED ORDER — HYDROMORPHONE HCL 2 MG PO TABS
2.0000 mg | ORAL_TABLET | Freq: Every evening | ORAL | Status: DC | PRN
  Administered 2015-09-04: 2 mg via ORAL
  Filled 2015-09-03: qty 1

## 2015-09-03 MED ORDER — HYDROMORPHONE HCL 1 MG/ML IJ SOLN
1.0000 mg | Freq: Once | INTRAMUSCULAR | Status: AC
  Administered 2015-09-03: 1 mg via INTRAVENOUS
  Filled 2015-09-03: qty 1

## 2015-09-03 MED ORDER — SODIUM CHLORIDE 0.9 % IV SOLN
500.0000 mg | Freq: Three times a day (TID) | INTRAVENOUS | Status: DC
  Administered 2015-09-03 – 2015-09-04 (×3): 500 mg via INTRAVENOUS
  Filled 2015-09-03 (×6): qty 500

## 2015-09-03 MED ORDER — VANCOMYCIN HCL IN DEXTROSE 1-5 GM/200ML-% IV SOLN
1000.0000 mg | Freq: Once | INTRAVENOUS | Status: DC
  Filled 2015-09-03: qty 200

## 2015-09-03 NOTE — ED Provider Notes (Signed)
By signing my name below, I, [name], attest that this documentation has been prepared under the direction and in the presence of [employer], DO. Electronically Signed: Randa [name], ED Scribe. 09/03/2015. 2:44 AM.  TIME SEEN: 2:47 AM   CHIEF COMPLAINT: Foot pain  HPI: HPI Comments: [name] is a 59 y.o. 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 Pt states that he has 3 lesions on the left foot. Pt has associated redness, warmth and swelling of the foot. Pt has had the 4th toe amputated on the left foot. Pt doesn't report any alleviating symptoms. Pt presents with a right BKA and reports Hx of similar infections and pain prior to having the BKA and left 4th toe amputation. Pt denies fever, vomiting or diarrhea. Pt repots that he is currently taking aspirin and Plavix.   States he has had to have multiple surgeries by orthopedics for wound infections and often fails outpatient antibiotics. His orthopedic surgeon is [name].  PCP is Anda Kraft with [facility].    ROS: See HPI Constitutional: no fever  Eyes: no drainage  ENT: no runny nose   Cardiovascular:  no chest pain  Resp: no SOB  GI: no vomiting GU: no dysuria Integumentary: no rash  Allergy: no hives  Musculoskeletal: no leg swelling  Neurological: no slurred speech ROS otherwise negative  PAST MEDICAL HISTORY/PAST SURGICAL HISTORY:  Past Medical History  Diagnosis Date  . Carotid artery occlusion 11/10/10    LEFT CAROTID ENDARTERECTOMY  . Diabetes mellitus   . Hypertension   . Hyperlipidemia   . COPD (chronic obstructive pulmonary disease) ([place])   . DJD (degenerative joint disease)   . GERD (gastroesophageal reflux disease)   . Substernal chest pain   . 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 Slurred speech     AS PER WIFE IN  D/C NOTE 11/10/10  . PAD (peripheral artery disease) ([place])     Distal aortogram June 2012. Atherectomy left popliteal artery July 2012.   . Barrett's esophagus   . Fatty liver   . Diverticulosis of colon (without mention of hemorrhage)   . Emphysema of lung ([place])   . Osteomyelitis ([place])   . Complication of anesthesia     BP WENT UP AT [facility] "  . Sleep apnea     has not used CPAP in e years - took self off  . H/O hiatal hernia   . Neuromuscular disorder ([place])     peripheral neuropathy  . Full dentures   . Wears glasses   . CHF (congestive heart failure) ([place])   . History of echocardiogram 08/22/2011    Mildly hypertrophic left ventricle with normal systolic function and mild diastolic dysfunction. mildly dilated left atrium. no significant valvular abnormalities. no pericardial effusion no change from previous study 10/2010. the ejection fraction =>55%  . History of cardiovascular stress test 03/23/2011    ejection fraction measured 51%, with an end-diastolic volume of 123456 ml and an end-systolic volume of 81 ml. no evidence of myocardial ischemia.  . H/O Doppler ultrasound 06/28/2012    Lower venous duplex left-- findings consistent with acute deep vein thrombosis involving the personal vein of the left lower extremity. No evidence of Baker's cyst on the left. Duplex scan of the left lower extremity arteries  reveal adequate blood flow.   . History of Holter monitoring 02/14/2012    Scan quality is fair due to baseline artifact. Predominant rhythm is Sinus. Veentricular Ectopy was noted in single beat form. The total recording time was 24:00 hours. total beats =104061. the total time analyzed was 23:58 hours. Heart  Max = 102 at 1:18:22, Min= 52 at 9:56:19 am., Avg= 70. There were 0 pause(s) > 2.5 seconds. the longest pause = 0.00 sec. at 1:51 pm(1)  . OSA (obstructive sleep apnea) 12/26/08    AHI of 6.3/hr overall, 32.7/hr during REM sleep and an RDI of 24.8/hr and 50.9/hr.    MEDICATIONS:   Prior to Admission medications   Medication Sig Start Date End Date Taking? Authorizing Provider  ALPRAZolam Duanne Moron) 0.5 MG tablet Take 0.5 tablets (0.25 mg total) by mouth every morning. Patient taking differently: Take 1 mg by mouth every morning. Take one tablet  Every morning 01/17/15   Jonetta Osgood, MD  aspirin EC 81 MG tablet Take 81 mg by mouth daily.    Historical Provider, MD  atorvastatin (LIPITOR) 40 MG tablet Take 40 mg by mouth daily.    Historical Provider, MD  AZOR 5-20 MG per tablet Take 1 tablet by mouth at bedtime.  07/29/14   Historical Provider, MD  carvedilol (COREG) 12.5 MG tablet Take 3 tablets (37.5 mg total) by mouth every morning. Patient not taking: Reported on 05/12/2015 01/20/15   Florencia Reasons, MD  carvedilol (COREG) 25 MG tablet every morning. 12/28/14   Historical Provider, MD  carvedilol (COREG) 6.25 MG tablet Take 6.25 mg by mouth at bedtime.    Historical Provider, MD  clobetasol (TEMOVATE) 0.05 % external solution Apply 1 application topically 2 (two) times daily as needed. 01/20/15   Florencia Reasons, MD  clopidogrel (PLAVIX) 75 MG tablet Take 75 mg by mouth daily. 1 tab daily 03/28/12   Historical Provider, MD  clotrimazole-betamethasone (LOTRISONE) cream Apply 1 application topically 2 (two) times daily. 01/03/15   Historical Provider, MD  ezetimibe (ZETIA) 10 MG tablet Take 10 mg by mouth every morning.    Historical Provider, MD  fentaNYL (DURAGESIC - DOSED MCG/HR) 50 MCG/HR Place 1 patch (50 mcg total) onto the skin every 3 (three) days. Patient taking differently: Place 50 mcg onto the skin every 3 (three) days. Change patch every three days 01/20/15   Florencia Reasons, MD  furosemide (LASIX) 80 MG tablet Take 0.5 tablets (40 mg total) by mouth daily. Patient taking differently: Take 80 mg by mouth daily.  01/20/15   Florencia Reasons, MD  glyBURIDE-metformin (GLUCOVANCE) 2.5-500 MG per tablet Take 2 tablets by mouth 2 (two) times daily.  03/05/12   Historical Provider, MD  hydrALAZINE  (APRESOLINE) 25 MG tablet Take 1 tablet (25 mg total) by mouth 3 (three) times daily. 10/30/14   Adrian Prows, MD  HYDROmorphone (DILAUDID) 2 MG tablet Take 1 tablet (2 mg total) by mouth every 4 (four) hours as needed. 01/17/15   Shanker Kristeen Mans, MD  insulin glargine (LANTUS) 100 UNIT/ML injection Inject 40 Units into the skin at bedtime.     Historical Provider, MD  insulin glulisine (APIDRA) 100 UNIT/ML injection Inject into the skin 3 (three) times daily before meals. 10 breakfast 10 lunch and 10  supper    Historical Provider, MD  isosorbide mononitrate (IMDUR) 120 MG 24 hr tablet Take 120 mg by mouth daily.    Historical Provider, MD  Linaclotide Rolan Lipa) 145 MCG CAPS capsule Take 1 capsule (  145 mcg total) by mouth daily as needed. 01/20/15   Florencia Reasons, MD  meloxicam (MOBIC) 15 MG tablet Take 15 mg by mouth daily.    Historical Provider, MD  Multiple Vitamin (MULTIVITAMIN) tablet Take 1 tablet by mouth daily.    Historical Provider, MD  nitroGLYCERIN (NITROSTAT) 0.4 MG SL tablet Place 1 tablet (0.4 mg total) under the tongue every 5 (five) minutes as needed (as needed for esophageal spasm). 08/09/14   Jerene Bears, MD  omega-3 acid ethyl esters (LOVAZA) 1 G capsule Take 2,000 mg by mouth 2 (two) times daily.     Historical Provider, MD  ondansetron (ZOFRAN-ODT) 4 MG disintegrating tablet Take 4 mg by mouth 4 (four) times daily as needed for nausea.    Historical Provider, MD  oxyCODONE-acetaminophen (PERCOCET/ROXICET) 5-325 MG per tablet Take 1 tablet by mouth every 8 (eight) hours as needed. Patient taking differently: Take 1 tablet by mouth every 8 (eight) hours.  09/30/14   Thurnell Lose, MD  pantoprazole (PROTONIX) 40 MG tablet Take 1 tablet (40 mg total) by mouth daily before breakfast. 10/30/14   Adrian Prows, MD  Polyethyl Glycol-Propyl Glycol (SYSTANE ULTRA OP) Place 1 drop into both eyes as needed (for dry eyes).    Historical Provider, MD  pregabalin (LYRICA) 100 MG capsule Take 100 mg by  mouth 3 (three) times daily.    Historical Provider, MD  rOPINIRole (REQUIP) 2 MG tablet Take 2 mg by mouth at bedtime.     Historical Provider, MD  Tamsulosin HCl (FLOMAX) 0.4 MG CAPS Take 0.4 mg by mouth daily.    Historical Provider, MD    ALLERGIES:  Allergies  Allergen Reactions  . Ivp Dye [Iodinated Diagnostic Agents] Anaphylaxis    Breathing problems  . Adhesive [Tape] Rash    SOCIAL HISTORY:  Social History  Substance Use Topics  . Smoking status: Former Smoker -- 0.50 packs/day for 35 years    Types: E-cigarettes    Quit date: 10/28/2011  . Smokeless tobacco: Never Used     Comment: VAPOR CIGARETTES  . Alcohol Use: No     Comment: ONCE IN A WHILE    FAMILY HISTORY: Family History  Problem Relation Age of Onset  . Heart disease Father     Before age 50-  CAD, BPG  . Diabetes Father     Amputation  . Cancer Father     PROSTATE  . Hyperlipidemia Father   . Hypertension Father   . Heart attack Father     Triple BPG  . Varicose Veins Father   . Arthritis      GRANDMOTHER  . Hypertension      OTHER FAMILY MEMBERS  . Colon cancer Brother   . Cancer Brother     Colon  . Diabetes Brother   . Heart disease Brother 68    A-Fib. Before age 1  . Hyperlipidemia Brother   . Hypertension Brother   . Cancer Sister     Breast  . Hyperlipidemia Sister   . Hypertension Sister   . Hypertension Son     EXAM: BP 169/66 mmHg  Pulse 79  Temp(Src) 98.7 F (37.1 C) (Oral)  Resp 16  Ht 6\' 2"  (1.88 m)  Wt 248 lb (112.492 kg)  BMI 31.83 kg/m2  SpO2 97%   CONSTITUTIONAL: Alert and oriented and responds appropriately to questions. Chronically ill appearing, rectal temp 99.3, nontoxic HEAD: Normocephalic EYES: Conjunctivae clear, PERRL ENT: normal nose; no rhinorrhea; moist mucous membranes;  pharynx without lesions noted NECK: Supple, no meningismus, no LAD  CARD: RRR; S1 and S2 appreciated; no murmurs, no clicks, no rubs, no gallops RESP: Normal chest excursion  without splinting or tachypnea; breath sounds clear and equal bilaterally; no wheezes, no rhonchi, no rales, no hypoxia or respiratory distress, speaking full sentences ABD/GI: Normal bowel sounds; non-distended; soft, non-tender, no rebound, no guarding, no peritoneal signs BACK:  The back appears normal and is non-tender to palpation, there is no CVA tenderness EXT: status post right BKA with no sign of infection of the stump. status post 4th left toe amputation; he has 1x2 cm and 1x1 cm ulcerated lesion without drainage to the sole of the lateral left foot, erythema and warmth that spread from the dorsal left foot past the distal ankle, 2+ pitting edema from the knee down in LLE, 2+ left DP pulse; upper extremities appear normal, no joint effusions on exam    SKIN: Normal color for age and race; warm; cellulitis to the left foot and distal lower extremity NEURO: Moves all extremities equally, sensation to light touch intact diffusely, cranial nerves II through XII intact PSYCH: The patient's mood and manner are appropriate. Grooming and personal hygiene are appropriate.  MEDICAL DECISION MAKING: Patient here with cellulitis, 2 ulcerations to his left foot. States he is frequently failed outpatient antibiotics and also needs admission as his symptoms rapidly progressed and he often needs surgery. Rectal temp 99.3. He is nontoxic appearing. We'll obtain labs, cultures and give broad-spectrum antibiotics. Will obtain x-ray of this foot.  ED PROGRESS: Labs unremarkable. No leukocytosis. Normal lactate and blood glucose. X-ray shows no gas or sign of bony destruction. No injury to this area. Area of redness on his foot does appear to be improving with IV fluids, antibiotics. Pain is been controlled with Dilaudid. I am concerned however given how rapidly his infection often spreads in the setting of being immunocompromised with diabetes that he may need admission for IV antibiotics and further observation.  Will discuss with hospitalist on-call. Patient is comfortable with this plan and would prefer admission ever discharge.   5:30 AM  D/w Dr. Tamala Julian for admission to medical, obs. I will place holding orders per his request.     I personally performed the services described in this documentation, which was scribed in my presence. The recorded information has been reviewed and is accurate.    [place], DO 09/03/15 [phone]

## 2015-09-03 NOTE — ED Notes (Signed)
CHECKED CBG 137, RN GABBY INFORMED

## 2015-09-03 NOTE — Progress Notes (Signed)
Initial Nutrition Assessment  DOCUMENTATION CODES:   Obesity unspecified  INTERVENTION:  Provide Glucerna Shake po BID, each supplement provides 220 kcal and 10 grams of protein Provide Multivitamin with minerals daily   NUTRITION DIAGNOSIS:   Increased nutrient needs related to wound healing, chronic illness as evidenced by estimated needs.   GOAL:   Patient will meet greater than or equal to 90% of their needs   MONITOR:   PO intake, Supplement acceptance, Labs, Weight trends, Skin  REASON FOR ASSESSMENT:   Consult Wound healing  ASSESSMENT:   59 year old male patient with known peripheral vascular disease with prior bilateral lower extremity stenting in 2014 at [facility], former tobacco abuse, diabetes mellitus on insulin and oral agents, history of prior right BKA May 2016 setting of sepsis, CAD with 3 DES February 2016, hypertension, dyslipidemia, COPD, GERD with associated Barrett's esophagitis who presents to the ER with 3 days of lower extremity swelling initially beginning in the foot and now extended to the knee level, 2 days of pain in the left lateral foot that has been progressive with redness across the dorsum of the left foot and 2 new punctate non-stagable ulcerated lesions with eschars on the lateral plantar surface of the left foot.   No evidence of significant weight loss per weight history and pt appears well-nourished. Pt reports having a good appetite and eating well. He reports eating protein with each meal, but he doesn't care for meat; he supplements his diet by drinking Glucerna Shakes twice daily. RD emphasized the importance of nutrition and encouraged intake of protein, fruits, and vegetables. RD will order Glucerna/   Labs: low hemoglobin  Diet Order:  Diet Heart Room service appropriate?: Yes; Fluid consistency:: Thin  Skin:  Wound (see comment) (R BKA, Cellulitis of Left foot)  Last BM:  PTA  Height:   Ht Readings from Last 1  Encounters:  09/03/15 6\' 2"  (1.88 m)    Weight:   Wt Readings from Last 1 Encounters:  09/03/15 245 lb 13 oz (111.5 kg)    Ideal Body Weight:  80.8 kg  BMI:  Body mass index is 31.55 kg/(m^2).  Estimated Nutritional Needs:   Kcal:  2000-2250  Protein:  115-130 grams  Fluid:  >/=2.2 L/day  EDUCATION NEEDS:   No education needs identified at this time  [place], LDN Inpatient Clinical Dietitian Pager: [phone] After Hours Pager: [phone]

## 2015-09-03 NOTE — Progress Notes (Addendum)
ANTIBIOTIC CONSULT NOTE - INITIAL  Pharmacy Consult for Vancomycin and Primaxin Indication: Cellulitis  Allergies  Allergen Reactions  . Ivp Dye [Iodinated Diagnostic Agents] Anaphylaxis    Breathing problems  . Adhesive [Tape] Rash    Patient Measurements: Height: 6\' 2"  (188 cm) Weight: 245 lb 13 oz (111.5 kg) IBW/kg (Calculated) : 82.2 Adjusted Body Weight:   Vital Signs: Temp: 98 F (36.7 C) (12/24 0635) Temp Source: Oral (12/24 0635) BP: 151/62 mmHg (12/24 0635) Pulse Rate: 73 (12/24 0635) Intake/Output from previous day: 12/23 0701 - 12/24 0700 In: -  Out: 800 [Urine:800] Intake/Output from this shift:    Labs:  Recent Labs  09/03/15 0413  WBC 8.5  HGB 11.0*  PLT 197  CREATININE 1.57*   Estimated Creatinine Clearance: 65.6 mL/min (by C-G formula based on Cr of 1.57). No results for input(s): VANCOTROUGH, VANCOPEAK, VANCORANDOM, GENTTROUGH, GENTPEAK, GENTRANDOM, TOBRATROUGH, TOBRAPEAK, TOBRARND, AMIKACINPEAK, AMIKACINTROU, AMIKACIN in the last 72 hours.   Microbiology: No results found for this or any previous visit (from the past 720 hour(s)).  Medical History: Past Medical History  Diagnosis Date  . Carotid artery occlusion 11/10/10    LEFT CAROTID ENDARTERECTOMY  . Diabetes mellitus   . Hypertension   . Hyperlipidemia   . COPD (chronic obstructive pulmonary disease) ([place])   . DJD (degenerative joint disease)   . GERD (gastroesophageal reflux disease)   . Substernal chest pain   . 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 Slurred speech     AS PER WIFE IN D/C NOTE 11/10/10  . PAD (peripheral artery disease) ([place])     Distal aortogram June 2012. Atherectomy left popliteal artery July 2012.   . Barrett's esophagus   . Fatty liver   . Diverticulosis of colon (without mention of hemorrhage)   . Emphysema of lung ([place])   . Osteomyelitis ([place])   . Complication of anesthesia     BP WENT UP AT [facility] "  . Sleep apnea     has not  used CPAP in e years - took self off  . H/O hiatal hernia   . Neuromuscular disorder ([place])     peripheral neuropathy  . Full dentures   . Wears glasses   . CHF (congestive heart failure) ([place])   . History of echocardiogram 08/22/2011    Mildly hypertrophic left ventricle with normal systolic function and mild diastolic dysfunction. mildly dilated left atrium. no significant valvular abnormalities. no pericardial effusion no change from previous study 10/2010. the ejection fraction =>55%  . History of cardiovascular stress test 03/23/2011    ejection fraction measured 51%, with an end-diastolic volume of 123456 ml and an end-systolic volume of 81 ml. no evidence of myocardial ischemia.  . H/O Doppler ultrasound 06/28/2012    Lower venous duplex left-- findings consistent with acute deep vein thrombosis involving the personal vein of the left lower extremity. No evidence of Baker's cyst on the left. Duplex scan of the left lower extremity arteries reveal adequate blood flow.   . History of Holter monitoring 02/14/2012    Scan quality is fair due to baseline artifact. Predominant rhythm is Sinus. Veentricular Ectopy was noted in single beat form. The total recording time was 24:00 hours. total beats =104061. the total time analyzed was 23:58 hours. Heart  Max = 102 at 1:18:22, Min= 52 at 9:56:19 am., Avg= 70. There were 0 pause(s) > 2.5 seconds. the longest pause = 0.00 sec. at 1:51  pm(1)  . OSA (obstructive sleep apnea) 12/26/08    AHI of 6.3/hr overall, 32.7/hr during REM sleep and an RDI of 24.8/hr and 50.9/hr.    Medications:  Scheduled:  . ALPRAZolam  0.25 mg Oral TID  . aspirin EC  81 mg Oral Daily  . atorvastatin  40 mg Oral Daily  . carvedilol  25 mg Oral Q breakfast  . clopidogrel  75 mg Oral Daily  . enoxaparin (LOVENOX) injection  40 mg Subcutaneous Q24H  . ezetimibe  10 mg Oral q morning - 10a  . fentaNYL  50 mcg Transdermal Q72H  . glyBURIDE  2.5 mg Oral Q breakfast  .  imipenem-cilastatin  500 mg Intravenous Once  . insulin aspart  0-15 Units Subcutaneous TID WC  . insulin aspart  0-5 Units Subcutaneous QHS  . insulin aspart  10 Units Subcutaneous TID AC  . insulin glargine  40 Units Subcutaneous QHS  . isosorbide mononitrate  120 mg Oral Daily  . omega-3 acid ethyl esters  1 g Oral Daily  . oxyCODONE-acetaminophen  1-2 tablet Oral 3 times per day  . pantoprazole  40 mg Oral QAC breakfast  . piperacillin-tazobactam (ZOSYN)  IV  3.375 g Intravenous Once  . pregabalin  100 mg Oral TID  . rOPINIRole  2 mg Oral QHS  . tamsulosin  0.4 mg Oral Daily  . vancomycin  1,000 mg Intravenous Once   Assessment: 59yo diabetic male, in acute renal failure and with cellulitis of left foot, to start antibiotics.  He received Vancomycin 1000mg  and Zosyn 3.375g at 0430 this AM.    Cr 1.57 (1.18 in May '16).  nCrCl ~58ml/min LA wnl WBC wnl Blood cx x 2 pending  Goal of Therapy:  Vancomycin trough level 10-15 mcg/ml  Plan:  Primaxin 500mg  IV q8, first dose at 10AM since received Zosyn earlier Vancomycin 1250mg  IV q24, next dose at 2200 Watch renal fxn closely and f/u blood cx Vanc trough when appropriate  Gracy Bruins, PharmD [facility]

## 2015-09-03 NOTE — ED Notes (Signed)
Called Doppler per floor RN request. Got voicemail that stated they were not operating at this time.

## 2015-09-03 NOTE — ED Notes (Signed)
Patient transported to CT 

## 2015-09-03 NOTE — H&P (Signed)
[facility] History and Physical                                                                                    [name], is a 59 y.o. male  MRN: [id number]   DOB - 1955-10-20  Admit Date - 09/03/2015  Outpatient Primary MD for the patient is [name], MD  Referring MD: [facility] / ER  PMH: Past Medical History  Diagnosis Date  . Carotid artery occlusion 11/10/10    LEFT CAROTID ENDARTERECTOMY  . Diabetes mellitus   . Hypertension   . Hyperlipidemia   . COPD (chronic obstructive pulmonary disease) ([place])   . DJD (degenerative joint disease)   . GERD (gastroesophageal reflux disease)   . Substernal chest pain   . 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 Slurred speech     AS PER WIFE IN D/C NOTE 11/10/10  . PAD (peripheral artery disease) ([place])     Distal aortogram June 2012. Atherectomy left popliteal artery July 2012.   . Barrett's esophagus   . Fatty liver   . Diverticulosis of colon (without mention of hemorrhage)   . Emphysema of lung ([place])   . Osteomyelitis ([place])   . Complication of anesthesia     BP WENT UP AT [facility] "  . Sleep apnea     has not used CPAP in e years - took self off  . H/O hiatal hernia   . Neuromuscular disorder ([place])     peripheral neuropathy  . Full dentures   . Wears glasses   . CHF (congestive heart failure) ([place])   . History of echocardiogram 08/22/2011    Mildly hypertrophic left ventricle with normal systolic function and mild diastolic dysfunction. mildly dilated left atrium. no significant valvular abnormalities. no pericardial effusion no change from previous study 10/2010. the ejection fraction =>55%  . History of cardiovascular stress test 03/23/2011    ejection fraction measured 51%, with an end-diastolic volume of 123456 ml and an end-systolic volume of 81 ml. no evidence of myocardial ischemia.  . H/O Doppler ultrasound 06/28/2012    Lower venous duplex left-- findings consistent with acute deep vein  thrombosis involving the personal vein of the left lower extremity. No evidence of Baker's cyst on the left. Duplex scan of the left lower extremity arteries reveal adequate blood flow.   . History of Holter monitoring 02/14/2012    Scan quality is fair due to baseline artifact. Predominant rhythm is Sinus. Veentricular Ectopy was noted in single beat form. The total recording time was 24:00 hours. total beats =[id number]. the total time analyzed was 23:58 hours. Heart  Max = 102 at 1:18:22, Min= 52 at 9:56:19 am., Avg= 70. There were 0 pause(s) > 2.5 seconds. the longest pause = 0.00 sec. at 1:51 pm(1)  . OSA (obstructive sleep apnea) 12/26/08    AHI of 6.3/hr overall, 32.7/hr during REM sleep and an RDI of 24.8/hr and 50.9/hr.      PSH: Past Surgical History  Procedure Laterality Date  . Laminectomy      X 3 LUMBAR AND X 2 CERVICAL SPINE OPERATIONS  .  Carotid endarterectomy  11/10/10  . Hernia repair      LEFT INGUINAL AND UMBILICAL REPAIRS  . Anterior fusion cervical spine  02/06/06    C4-5, C5-6, C6-7; SURGEON DR. MAX COHEN  . Penile prosthesis implant  08/14/05    INFRAPUBIC INSERTION OF INFLATABLE PENILE PROSTHESIS; SURGEON DR. Amalia Hailey  . Laparoscopic cholecystectomy w/ cholangiography  11/09/04    SURGEON DR. Luella Cook  . Total knee arthroplasty  07/2002    RIGHT KNEE ; SURGEON  DR. [name] ALSO HAD ARTHROSCOPIC RIGHT KNEE IN  10/2001  . Shoulder arthroscopy    . Cholecystectomy    . Amputation  11/05/2011    Procedure: AMPUTATION RAY;  Surgeon: Wylene Simmer, MD;  Location: [place];  Service: Orthopedics;  Laterality: Right;  Amputation of Right 4&5th Toes  . Femoral artery stent      x6  . Amputation Left 11/26/2012    Procedure: AMPUTATION RAY;  Surgeon: Wylene Simmer, MD;  Location: [place];  Service: Orthopedics;  Laterality: Left;  fourth ray amputation  . Cardiac catheterization  10/31/04    2009  . Carpal tunnel release Right 10/21/2013    Procedure: RIGHT CARPAL TUNNEL RELEASE;  Surgeon:  Wynonia Sours, MD;  Location: [place];  Service: Orthopedics;  Laterality: Right;  . Ulnar nerve transposition Right 10/21/2013    Procedure: RIGHT ELBOW  ULNAR NERVE DECOMPRESSION;  Surgeon: Wynonia Sours, MD;  Location: [place];  Service: Orthopedics;  Laterality: Right;  . Joint replacement Right 2001    Total  . Spine surgery    . Neck surgery    . Esophageal manometry Bilateral 07/19/2014    Procedure: ESOPHAGEAL MANOMETRY (EM);  Surgeon: Jerene Bears, MD;  Location: [facility];  Service: Gastroenterology;  Laterality: Bilateral;  . Lower extremity angiogram N/A 03/19/2012    Procedure: LOWER EXTREMITY ANGIOGRAM;  Surgeon: Burnell Blanks, MD;  Location: [facility] CATH LAB;  Service: Cardiovascular;  Laterality: N/A;  . Amputation Right 08/27/2014    Procedure: Transmetatarsal Amputation;  Surgeon: Newt Minion, MD;  Location: [place];  Service: Orthopedics;  Laterality: Right;  . Left heart catheterization with coronary angiogram N/A 10/29/2014    Procedure: LEFT HEART CATHETERIZATION WITH CORONARY ANGIOGRAM;  Surgeon: Laverda Page, MD;  Location: [facility] CATH LAB;  Service: Cardiovascular;  Laterality: N/A;  . Percutaneous coronary stent intervention (pci-s) Right 10/29/2014    Procedure: PERCUTANEOUS CORONARY STENT INTERVENTION (PCI-S);  Surgeon: Laverda Page, MD;  Location: [facility] CATH LAB;  Service: Cardiovascular;  Laterality: Right;  . Amputation Right 01/14/2015    Procedure: AMPUTATION BELOW KNEE;  Surgeon: Newt Minion, MD;  Location: [place];  Service: Orthopedics;  Laterality: Right;  . Carotid endarterectomy Left 11/10/2010    Subtotal occlusion of left internal carotid artery with left hemispheric transient ischemic attacks.     CC:  Chief Complaint  Patient presents with  . 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 The symptoms were associated with fevers and chills No drainage. No nausea vomiting or diarrhea. Patient reports CBGs have remained stable. He has not purchased or worn any new footwear. He call symptoms were similar to his septic presentation in May 2016 that resulted in amputation the patient presented to the ER for treatment  ER Evaluation and treatment: Complete three-view x-ray left foot known soft tissue ulceration is partially characterize but no definitive evidence of osteomyelitis Hemodynamically stable with low-grade temperature in ER Lactic acid normal 2 collections BUN 31 and creatinine 1.57 with baseline BUN 15 and creatinine 1.18 Hemoglobin stable at 11 No Leukocytosis Blood cultures obtained Normal saline bolus 1000 mL Dilaudid 1 mg IV Zofran 4 mg IV Vancomycin 1000 mg IV Zosyn 3.375 g IV   Review of Systems   In addition to the HPI above,  No Headache, changes with Vision or hearing, new weakness, tingling, numbness in any extremity, No problems swallowing food or Liquids, indigestion/reflux No Chest pain, Cough or Shortness of Breath, palpitations, orthopnea or DOE No Abdominal pain, N/V; no melena or hematochezia, no dark tarry stools, Bowel movements are regular, No dysuria, hematuria or flank pain No new masses or bruises, No new joints pains-aches No recent weight gain or loss No polyuria, polydypsia or  polyphagia,  *A full 10 point Review of Systems was done, except as stated above, all other Review of Systems were negative.  Social History Social History  Substance Use Topics  . Smoking status: Former Smoker -- 0.50 packs/day for 35 years    Types: E-cigarettes    Quit date: 10/28/2011  . Smokeless tobacco: Never Used     Comment: VAPOR CIGARETTES  . Alcohol Use: No     Comment: ONCE IN A WHILE    Resides at: Private residence  Lives with: Alone  Ambulatory status: Utilizes his right lower extremity prosthesis and mobilizes with assistance of a cane-discharged from outpatient rehabilitative therapy on 10/31   Family History Family History  Problem Relation Age of Onset  . Heart disease Father     Before age 71-  CAD, BPG  . Diabetes Father     Amputation  . Cancer Father     PROSTATE  . Hyperlipidemia Father   . Hypertension Father   . Heart attack Father     Triple BPG  . Varicose Veins Father   . Arthritis      GRANDMOTHER  . Hypertension      OTHER FAMILY MEMBERS  . Colon cancer Brother   . Cancer Brother     Colon  . Diabetes Brother   . Heart disease Brother 63    A-Fib. Before age 86  . Hyperlipidemia Brother   . Hypertension Brother   . Cancer Sister     Breast  . Hyperlipidemia Sister   . Hypertension Sister   . Hypertension Son      Prior to Admission medications   Medication Sig Start Date End Date Taking? Authorizing Provider  ALPRAZolam Duanne Moron) 0.5 MG tablet Take 0.5 tablets (0.25 mg total) by mouth every morning. Patient taking differently: Take 0.25 mg by mouth 3 (three) times daily.  01/17/15  Yes Shanker Kristeen Mans, MD  aspirin EC 81 MG tablet Take 81 mg by mouth daily.   Yes Historical Provider, MD  atorvastatin (LIPITOR) 40 MG  tablet Take 40 mg by mouth daily.   Yes Historical Provider, MD  carvedilol (COREG) 12.5 MG tablet Take 3 tablets (37.5 mg total) by mouth every morning. Patient taking differently: Take 25 mg by mouth every  morning.  01/20/15  Yes Florencia Reasons, MD  clopidogrel (PLAVIX) 75 MG tablet Take 75 mg by mouth daily. 1 tab daily 03/28/12  Yes Historical Provider, MD  ezetimibe (ZETIA) 10 MG tablet Take 10 mg by mouth every morning.   Yes Historical Provider, MD  fentaNYL (DURAGESIC - DOSED MCG/HR) 50 MCG/HR Place 1 patch (50 mcg total) onto the skin every 3 (three) days. Patient taking differently: Place 50 mcg onto the skin every 3 (three) days. Change patch every three days 01/20/15  Yes Florencia Reasons, MD  furosemide (LASIX) 80 MG tablet Take 0.5 tablets (40 mg total) by mouth daily. Patient taking differently: Take 80 mg by mouth daily.  01/20/15  Yes Florencia Reasons, MD  glyBURIDE-metformin (GLUCOVANCE) 2.5-500 MG per tablet Take 2 tablets by mouth 2 (two) times daily.  03/05/12  Yes Historical Provider, MD  HYDROmorphone (DILAUDID) 2 MG tablet Take 1 tablet (2 mg total) by mouth every 4 (four) hours as needed. Patient taking differently: Take 2 mg by mouth at bedtime as needed for moderate pain.  01/17/15  Yes Shanker Kristeen Mans, MD  insulin glargine (LANTUS) 100 UNIT/ML injection Inject 40 Units into the skin at bedtime.    Yes Historical Provider, MD  insulin glulisine (APIDRA) 100 UNIT/ML injection Inject 10 Units into the skin 3 (three) times daily before meals. 10 breakfast 10 lunch and 10  supper   Yes Historical Provider, MD  isosorbide mononitrate (IMDUR) 120 MG 24 hr tablet Take 120 mg by mouth daily.   Yes Historical Provider, MD  Linaclotide Rolan Lipa) 145 MCG CAPS capsule Take 1 capsule (145 mcg total) by mouth daily as needed. Patient taking differently: Take 145 mcg by mouth daily as needed (constipation).  01/20/15  Yes Florencia Reasons, MD  nitroGLYCERIN (NITROSTAT) 0.4 MG SL tablet Place 1 tablet (0.4 mg total) under the tongue every 5 (five) minutes as needed (as needed for esophageal spasm). 08/09/14  Yes Jerene Bears, MD  omega-3 acid ethyl esters (LOVAZA) 1 G capsule Take 1 g by mouth daily.    Yes Historical Provider, MD    ondansetron (ZOFRAN-ODT) 4 MG disintegrating tablet Take 4 mg by mouth 4 (four) times daily as needed for nausea.   Yes Historical Provider, MD  oxyCODONE-acetaminophen (PERCOCET/ROXICET) 5-325 MG per tablet Take 1 tablet by mouth every 8 (eight) hours as needed. Patient taking differently: Take 1 tablet by mouth every 8 (eight) hours.  09/30/14  Yes Thurnell Lose, MD  pantoprazole (PROTONIX) 40 MG tablet Take 1 tablet (40 mg total) by mouth daily before breakfast. 10/30/14  Yes Adrian Prows, MD  Polyethyl Glycol-Propyl Glycol (SYSTANE ULTRA OP) Place 1 drop into both eyes as needed (for dry eyes).   Yes Historical Provider, MD  pregabalin (LYRICA) 100 MG capsule Take 100 mg by mouth 3 (three) times daily.   Yes Historical Provider, MD  rOPINIRole (REQUIP) 2 MG tablet Take 2 mg by mouth at bedtime.    Yes Historical Provider, MD  Tamsulosin HCl (FLOMAX) 0.4 MG CAPS Take 0.4 mg by mouth daily.   Yes Historical Provider, MD    Allergies  Allergen Reactions  . Ivp Dye [Iodinated Diagnostic Agents] Anaphylaxis    Breathing problems  . Adhesive [Tape] Rash    Physical  Exam  Vitals  Blood pressure 151/62, pulse 73, temperature 98 F (36.7 C), temperature source Oral, resp. rate 18, height 6\' 2"  (1.88 m), weight 245 lb 13 oz (111.5 kg), SpO2 100 %.   General:  In no acute distress, appears healthy and well nourished  Psych:  Normal affect, Denies Suicidal or Homicidal ideations, Awake Alert, Oriented X 3. Speech and thought patterns are clear and appropriate, no apparent short term memory deficits  Neuro:   No focal neurological deficits, CN II through XII intact, Strength 5/5 all 4 extremities, Sensation intact all 4 extremities.  ENT:  Ears and Eyes appear Normal, Conjunctivae clear, PER. Moist oral mucosa without erythema or exudates.  Neck:  Supple, No lymphadenopathy appreciated  Respiratory:  Symmetrical chest wall movement, Good air movement bilaterally, CTAB. Room Air  Cardiac:   RRR, No Murmurs, focal left LE edema noted-1+ from knee to ankle 2-3+ involving dorsum of foot, no JVD, No carotid bruits, peripheral pulses palpable at 2+  Abdomen:  Positive bowel sounds, Soft, Non tender, Non distended,  No masses appreciated, no obvious hepatosplenomegaly  Skin:  No Cyanosis, Normal Skin Turgor, patient has a linear area of redness on the dorsum of the left foot as well as 2 punctate superficial ulcers with eschars with some peri  wound erythema noting these wounds are unstageable.(See pictures below)  no purulence or drainage noted or expressed  Extremities: Symmetrical without obvious trauma or injury,  no effusions.      Data Review  CBC  Recent Labs Lab 09/03/15 0413  WBC 8.5  HGB 11.0*  HCT 31.7*  PLT 197  MCV 82.8  MCH 28.7  MCHC 34.7  RDW 13.6  LYMPHSABS 1.7  MONOABS 0.7  EOSABS 0.2  BASOSABS 0.0    Chemistries   Recent Labs Lab 09/03/15 0413  NA 138  K 4.0  CL 103  CO2 25  GLUCOSE 155*  BUN 31*  CREATININE 1.57*  CALCIUM 9.5    estimated creatinine clearance is 65.6 mL/min (by C-G formula based on Cr of 1.57).  No results for input(s): TSH, T4TOTAL, T3FREE, THYROIDAB in the last 72 hours.  Invalid input(s): FREET3  Coagulation profile No results for input(s): INR, PROTIME in the last 168 hours.  No results for input(s): DDIMER in the last 72 hours.  Cardiac Enzymes No results for input(s): CKMB, TROPONINI, MYOGLOBIN in the last 168 hours.  Invalid input(s): CK  Invalid input(s): POCBNP  Urinalysis    Component Value Date/Time   COLORURINE YELLOW 01/14/2015 0346   APPEARANCEUR CLEAR 01/14/2015 0346   LABSPEC 1.018 01/14/2015 0346   PHURINE 5.5 01/14/2015 0346   GLUCOSEU >1000* 01/14/2015 0346   HGBUR NEGATIVE 01/14/2015 0346   BILIRUBINUR NEGATIVE 01/14/2015 0346   KETONESUR NEGATIVE 01/14/2015 0346   PROTEINUR NEGATIVE 01/14/2015 0346   UROBILINOGEN 0.2 01/14/2015 0346   NITRITE NEGATIVE 01/14/2015 0346    LEUKOCYTESUR NEGATIVE 01/14/2015 0346    Imaging results:   Dg Foot Complete Left  09/03/2015  CLINICAL DATA:  Acute onset of left foot pain, with ulceration at the fifth metatarsophalangeal joint. Initial encounter. EXAM: LEFT FOOT - COMPLETE 3+ VIEW COMPARISON:  Left foot radiographs performed 05/27/2013 FINDINGS: The known soft tissue ulceration is partially characterized. No definite osseous erosion is seen to suggest osteomyelitis, though it cannot be excluded on the basis of radiograph. The patient is status post amputation of the fourth proximal phalanx. Visualized joint spaces are grossly preserved. A plantar calcaneal spur is incidentally noted. IMPRESSION:  Known soft tissue ulceration is partially characterized. No definite evidence for osteomyelitis, though it cannot be excluded on the basis of radiograph. Electronically Signed   By: Garald Balding M.D.   On: 09/03/2015 04:01     Assessment & Plan  Principal Problem:   Cellulitis of left foot/PVD/peripheral neuropathy -Observation status medical floor -Pulses present and no evidence of purulence -Severe symptoms in setting of peripheral vascular disease therefore utilize vancomycin and Primaxin (pseudomonal coverage in diabetes) -Increase home Percocet from 1-2 tablets prn -Continue Lyrica, Requip, preadmission Duragesic patch and Dilaudid HS -WOC RN evaluation -May need offloading device for left foot  Active Problems:   Leg edema, left -Likely related to acute infectious process but will check lower extremity duplex to rule out DVT    Acute renal failure -Hold diuretics and metformin -Gentle IV fluid hydration -Labs in a.m.    HTN /chronic stage II diastolic heart failure -Chronic compensated and blood pressure well controlled -Diuretic on hold as above -Continue carvedilol -Was not on ACE or ARB prior to admission    Diabetes mellitus, insulin dependent (IDDM), controlled  -Current CBG well controlled -Last  hemoglobin A1c 6.14 Sep 2014 -Metformin on hold as above -Continue glyburide, Lantus and pharmacy substitution for Apidra -SSI    CAD (dz of distal, mid and proximal RCA with implantation of 3 overlapping drug-eluting stent,) -Currently asymptomatic -Continue low-dose aspirin, Plavix, statin, Imdur and carvedilol    GERD -Continue Protonix    Hyperlipidemia -Continue Lipitor    DVT Prophylaxis: Lovenox  Family Communication:   No family at bedside  Code Status:  Full Code  Condition:  Stable  Discharge disposition: Anticipate discharge to home environment in the next 48 hours pending resolution of cellulitic symptoms  Time spent in minutes : 60      Ashur Glatfelter L. ANP on 09/03/2015 at 8:30 AM  You may contact me by going to [email or web address] - password [email or web address]  I am available from 7a-7p but please confirm I am on the schedule by going to Amion as above.   After 7p please contact night coverage person covering me after hours  [facility]

## 2015-09-03 NOTE — Progress Notes (Signed)
Talked with Dr. Grandville Silos about patient's meal coverage with Novolog.  At 17:00 CBG was 125, at that time he would have gotten 1 unit for sliding scale and then 10 units of Novolog for meal coverage.  I was concerned that his blood sugar may drop too low if this was given.  When I asked the patient about his daily medication for his blood sugar he stated " I give myself 10 units of insulin at mealtime, three times daily,  and an additional 40 units at night.  Dr. Grandville Silos decided to change the meal coverage to 5 units and requested that I only give this for coverage at this time.  This is noted on the [facility] also.

## 2015-09-03 NOTE — ED Notes (Signed)
The pt is a diabetic and early yesterday am he noticed somed red and black areas on his lt foot  As the day progressed he dev eloped 2 more lesions on the lt foot.  The pain has increased and hwe is worried because  His rt foot has been amputated.

## 2015-09-04 ENCOUNTER — Inpatient Hospital Stay (HOSPITAL_COMMUNITY): Payer: BLUE CROSS/BLUE SHIELD

## 2015-09-04 DIAGNOSIS — M7989 Other specified soft tissue disorders: Secondary | ICD-10-CM

## 2015-09-04 DIAGNOSIS — Z794 Long term (current) use of insulin: Secondary | ICD-10-CM

## 2015-09-04 DIAGNOSIS — K219 Gastro-esophageal reflux disease without esophagitis: Secondary | ICD-10-CM

## 2015-09-04 DIAGNOSIS — I2510 Atherosclerotic heart disease of native coronary artery without angina pectoris: Secondary | ICD-10-CM

## 2015-09-04 DIAGNOSIS — I10 Essential (primary) hypertension: Secondary | ICD-10-CM

## 2015-09-04 DIAGNOSIS — I70244 Atherosclerosis of native arteries of left leg with ulceration of heel and midfoot: Secondary | ICD-10-CM

## 2015-09-04 DIAGNOSIS — Z87891 Personal history of nicotine dependence: Secondary | ICD-10-CM

## 2015-09-04 DIAGNOSIS — E119 Type 2 diabetes mellitus without complications: Secondary | ICD-10-CM

## 2015-09-04 DIAGNOSIS — I5032 Chronic diastolic (congestive) heart failure: Secondary | ICD-10-CM

## 2015-09-04 DIAGNOSIS — I739 Peripheral vascular disease, unspecified: Secondary | ICD-10-CM

## 2015-09-04 DIAGNOSIS — L03116 Cellulitis of left lower limb: Principal | ICD-10-CM

## 2015-09-04 LAB — CBC
HCT: 27.6 % — ABNORMAL LOW (ref 39.0–52.0)
Hemoglobin: 9.5 g/dL — ABNORMAL LOW (ref 13.0–17.0)
MCH: 29 pg (ref 26.0–34.0)
MCHC: 34.4 g/dL (ref 30.0–36.0)
MCV: 84.1 fL (ref 78.0–100.0)
Platelets: 171 10*3/uL (ref 150–400)
RBC: 3.28 MIL/uL — ABNORMAL LOW (ref 4.22–5.81)
RDW: 13.7 % (ref 11.5–15.5)
WBC: 5.7 10*3/uL (ref 4.0–10.5)

## 2015-09-04 LAB — COMPREHENSIVE METABOLIC PANEL
ALT: 11 U/L — ABNORMAL LOW (ref 17–63)
AST: 15 U/L (ref 15–41)
Albumin: 2.8 g/dL — ABNORMAL LOW (ref 3.5–5.0)
Alkaline Phosphatase: 89 U/L (ref 38–126)
Anion gap: 9 (ref 5–15)
BUN: 23 mg/dL — ABNORMAL HIGH (ref 6–20)
CO2: 26 mmol/L (ref 22–32)
Calcium: 9 mg/dL (ref 8.9–10.3)
Chloride: 105 mmol/L (ref 101–111)
Creatinine, Ser: 1.29 mg/dL — ABNORMAL HIGH (ref 0.61–1.24)
GFR calc Af Amer: >60 mL/min (ref >60)
GFR calc non Af Amer: 58 mL/min — ABNORMAL LOW (ref >60)
Glucose, Bld: 169 mg/dL — ABNORMAL HIGH (ref 65–99)
Potassium: 4.3 mmol/L (ref 3.5–5.1)
Sodium: 140 mmol/L (ref 135–145)
Total Bilirubin: 0.5 mg/dL (ref 0.3–1.2)
Total Protein: 5.7 g/dL — ABNORMAL LOW (ref 6.5–8.1)

## 2015-09-04 LAB — GLUCOSE, CAPILLARY
Glucose-Capillary: 122 mg/dL — ABNORMAL HIGH (ref 65–99)
Glucose-Capillary: 183 mg/dL — ABNORMAL HIGH (ref 65–99)
Glucose-Capillary: 160 mg/dL — ABNORMAL HIGH (ref 65–99)
Glucose-Capillary: 181 mg/dL — ABNORMAL HIGH (ref 65–99)

## 2015-09-04 MED ORDER — ALPRAZOLAM 0.25 MG PO TABS: 0.2500 mg | ORAL_TABLET | Freq: Three times a day (TID) | ORAL | Status: DC | PRN

## 2015-09-04 MED ORDER — OMEGA-3-ACID ETHYL ESTERS 1 G PO CAPS
2.0000 g | ORAL_CAPSULE | Freq: Two times a day (BID) | ORAL | Status: DC
  Administered 2015-09-04 – 2015-09-06 (×4): 2 g via ORAL
  Filled 2015-09-04 (×5): qty 2

## 2015-09-04 MED ORDER — LINACLOTIDE 145 MCG PO CAPS
145.0000 ug | ORAL_CAPSULE | Freq: Every day | ORAL | Status: DC | PRN
  Filled 2015-09-04: qty 1

## 2015-09-04 MED ORDER — OMEGA-3-ACID ETHYL ESTERS 1 G PO CAPS: 2.0000 g | ORAL_CAPSULE | Freq: Two times a day (BID) | ORAL | Status: DC

## 2015-09-04 NOTE — Progress Notes (Signed)
Pt upset about not getting meds (olmesartan) benicar and metformin he states he takes at home. Per MD Patel's note they are holding metform. Pt received 40 units of Lantus On call MD Grandville Silos was called about the issue and stated that they would address the issue on morning rounds. The home med list did not have benicar listed on there. However, pt said the medicine was in his son's car but the family refused to go get medication. Pt very upset asking why can't the nurses put in the orders. I explained my role as a nurse and the orders from the note. Pt stated " It will be on the Hospital if something happens because he does not have his BP medication". Charge RN spoke with patient about the issue. Will pass on during shift change

## 2015-09-04 NOTE — Progress Notes (Signed)
[facility] Progress Note  Patient: [name] [id number]   PCP: [name], MD DOB: 1956-07-07   DOA: 09/03/2015   DOS: 09/04/2015   Date of Service: the patient was seen and examined on 09/04/2015  Subjective: Patient denies any worsening pain in the left foot. No chest pain and abdominal pain nausea vomiting or diarrhea. No fever no chills. Tolerating oral diet. Nutrition: Tolerating oral diet Activity: Ambulating in the room Last BM: Prior to arrival  Assessment and Plan: 1. Cellulitis of left foot Patient presented with left leg ulcers with severe pain. MRI of the foot is negative for osteomyelitis. Leukocytosis is improving. Appears to be cellulitis. Prior history of Staphylococcus aureus MSSA. At present we'll continue with the vancomycin. Discontinue Zosyn. Continue with current pain management. We will monitor the patient clinically and if there is no improvement we'll discuss with ID as well as orthopedics. If the patient improves clinically will discuss with ID about patient antibiotic regimen. Vascular ABI are unremarkable.  2. Left leg edema. Dopplers are negative for DVT.  3. Hypertension, chronic diastolic heart failure. Headaches are on hold. Date fluids are discontinued. Continuing carvedilol.  4. Diabetes mellitus, controlled. Hemoglobin A1c 6.5 in January. Next time recheck it. Continuing with insulin regimen. Holding metformin.  5. Peripheral vascular disease, coronary artery disease. Continuing aspirin and Plavix statins Imdur and Coreg.  6. GERD. Continuing PPI.  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 The symptoms were associated with fevers and chills No drainage. No nausea vomiting or diarrhea. Patient reports CBGs have remained stable. He has not purchased or worn any new footwear. He call symptoms were similar to his septic presentation in May 2016 that resulted in amputation the patient presented to the ER for treatment" Daily update, Procedures: 09/04/2015 MRI negative for any osteomyelitis. Antibiotics narrowed to vancomycin. Consultants: None Antibiotics: Anti-infectives    Start     Dose/Rate Route Frequency Ordered Stop   09/03/15 2200  vancomycin (VANCOCIN) 1,250 mg in sodium chloride 0.9 % 250 mL IVPB     1,250 mg 166.7 mL/hr over 90 Minutes Intravenous Every 24 hours 09/03/15 0822     09/03/15 1200  piperacillin-tazobactam (ZOSYN) IVPB 3.375 g  Status:  Discontinued     3.375 g 12.5 mL/hr over 240 Minutes Intravenous 3 times per day 09/03/15 0822 09/03/15 0829   09/03/15 1000  imipenem-cilastatin (PRIMAXIN) 500 mg in sodium chloride 0.9 % 100 mL IVPB  Status:  Discontinued     500 mg 200 mL/hr over 30 Minutes Intravenous  Once 09/03/15 0747 09/03/15 0829   09/03/15 1000  imipenem-cilastatin (PRIMAXIN) 500 mg in sodium chloride 0.9 % 100 mL IVPB  500 mg 200 mL/hr over 30 Minutes Intravenous 3 times per day 09/03/15 0829     09/03/15 0830  vancomycin (VANCOCIN) IVPB 1000 mg/200 mL premix  Status:  Discontinued     1,000 mg 200 mL/hr  over 60 Minutes Intravenous  Once 09/03/15 0747 09/03/15 0831   09/03/15 0300  vancomycin (VANCOCIN) IVPB 1000 mg/200 mL premix     1,000 mg 200 mL/hr over 60 Minutes Intravenous  Once 09/03/15 0252 09/03/15 0532   09/03/15 0300  piperacillin-tazobactam (ZOSYN) IVPB 3.375 g     3.375 g 12.5 mL/hr over 240 Minutes Intravenous  Once 09/03/15 0252 09/03/15 [id number]       Family Communication: family was present at bedside, at the time of interview.   Disposition:  Expected discharge date: 09/06/2015 Barriers to safe discharge: Blood cultures, improvement in cellulitis   Intake/Output Summary (Last 24 hours) at 09/04/15 1447 Last data filed at 09/04/15 1300  Gross per 24 hour  Intake 2937.92 ml  Output   1400 ml  Net 1537.92 ml   Filed Weights   09/03/15 0057 09/03/15 0635 09/04/15 0631  Weight: 112.492 kg (248 lb) 111.5 kg (245 lb 13 oz) 118.933 kg (262 lb 3.2 oz)    Objective: Physical Exam: Filed Vitals:   09/03/15 0635 09/03/15 1300 09/04/15 0631 09/04/15 1356  BP: 151/62 146/62 159/77 138/60  Pulse: 73 66 59 65  Temp: 98 F (36.7 C) 98.7 F (37.1 C) 97.8 F (36.6 C) 98.1 F (36.7 C)  TempSrc: Oral  Oral Oral  Resp: 18 18 18 18   Height: 6\' 2"  (1.88 m)     Weight: 111.5 kg (245 lb 13 oz)  118.933 kg (262 lb 3.2 oz)   SpO2: 100% 98% 99% 98%     General: Appear in mild distress, to open ulcer on the left foot bottom, no other Rash; Oral Mucosa moist. Cardiovascular: S1 and S2 Present, no Murmur, no JVD Respiratory: Bilateral Air entry present and Clear to Auscultation, no Crackles, no wheezes Abdomen: Bowel Sound present, Soft and no tenderness Extremities: no Pedal edema, no calf tenderness Neurology: Grossly no focal neuro deficit.  Data Reviewed: CBC:  Recent Labs Lab 09/03/15 0413 09/04/15 0238  WBC 8.5 5.7  NEUTROABS 6.0  --   HGB 11.0* 9.5*  HCT 31.7* 27.6*  MCV 82.8 84.1  PLT 197 [id number]   Basic Metabolic Panel:  Recent Labs Lab 09/03/15 0413  09/04/15 0238  NA 138 140  K 4.0 4.3  CL 103 105  CO2 25 26  GLUCOSE 155* 169*  BUN 31* 23*  CREATININE 1.57* 1.29*  CALCIUM 9.5 9.0   Liver Function Tests:  Recent Labs Lab 09/04/15 0238  AST 15  ALT 11*  ALKPHOS 89  BILITOT 0.5  PROT 5.7*  ALBUMIN 2.8*   No results for input(s): LIPASE, AMYLASE in the last 168 hours. No results for input(s): AMMONIA in the last 168 hours.  Cardiac Enzymes: No results for input(s): CKTOTAL, CKMB, CKMBINDEX, TROPONINI in the last 168 hours.  BNP (last 3 results) No results for input(s): BNP in the last 8760 hours.  CBG:  Recent Labs Lab 09/03/15 1111 09/03/15 1602 09/03/15 2113 09/04/15 0639 09/04/15 1120  GLUCAP 339* 125* 102* 122* 183*    Recent Results (from the past 240 hour(s))  Culture, Urine     Status: None (Preliminary result)   Collection Time: 09/03/15  6:44 PM  Result Value Ref Range Status   Specimen Description URINE, CLEAN CATCH  Final  Special Requests NONE  Final   Culture NO GROWTH < 24 HOURS  Final   Report Status PENDING  Incomplete     Studies: Mri Left Foot Without Contrast  09/04/2015  CLINICAL DATA:  Left lower extremity pain and swelling with erythema along the dorsal lateral aspect of the foot for 3 days. Plantar foot ulceration. History of diabetes and right below the knee amputation. EXAM: MRI OF THE LEFT FOREFOOT WITHOUT CONTRAST TECHNIQUE: Multiplanar, multisequence MR imaging was performed. No intravenous contrast was administered. COMPARISON:  Radiographs 09/03/2015.  MRI 11/24/2012. FINDINGS: Since the previous MRI, the patient has undergone amputation of the fourth toe. The remaining toes and metatarsals appear stable without evidence of recurrent osteomyelitis. There are stable degenerative changes at the Lisfranc joint without subluxation. Subcutaneous edema throughout the dorsal and lateral aspect of the forefoot is similar to the previous study. Likewise, there is stable mild muscular T2  hyperintensity. Pressure lesions or areas of skin ulceration are noted plantar to the fourth and fifth metatarsal heads. There is no underlying focal fluid collection or soft tissue emphysema. Mild nodular thickening of the plantar fascia is noted, without change. IMPRESSION: 1. Interval amputation of the fourth toe. No evidence of recurrent osteomyelitis. 2. Dorsal subcutaneous edema consistent with cellulitis. No evidence of focal abscess. 3. 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 Electronically Signed   By: Richardean Sale M.D.   On: 09/04/2015 11:15     Scheduled Meds: . ALPRAZolam  0.25 mg Oral Daily  . aspirin EC  81 mg Oral Daily  . atorvastatin  40 mg Oral Daily  . carvedilol  25 mg Oral Q breakfast  . clopidogrel  75 mg Oral Daily  . enoxaparin (LOVENOX) injection  40 mg Subcutaneous Q24H  . ezetimibe  10 mg Oral q morning - 10a  . feeding supplement (GLUCERNA SHAKE)  237 mL Oral BID BM  . fentaNYL  50 mcg Transdermal Q72H  . imipenem-cilastatin  500 mg Intravenous 3 times per day  . insulin aspart  0-15 Units Subcutaneous TID WC  . insulin aspart  0-5 Units Subcutaneous QHS  . insulin aspart  5 Units Subcutaneous TID AC  . insulin glargine  40 Units Subcutaneous QHS  . isosorbide mononitrate  120 mg Oral Daily  . multivitamin with minerals  1 tablet Oral Daily  . omega-3 acid ethyl esters  2 g Oral BID  . oxyCODONE-acetaminophen  1-2 tablet Oral 3 times per day  . pantoprazole  40 mg Oral QAC breakfast  . pregabalin  100 mg Oral TID  . rOPINIRole  2 mg Oral QHS  . tamsulosin  0.4 mg Oral Daily  . vancomycin  1,250 mg Intravenous Q24H   Continuous Infusions: . sodium chloride 75 mL/hr at 09/03/15 1135   PRN Meds: alum & mag hydroxide-simeth, HYDROmorphone, ondansetron **OR** ondansetron (ZOFRAN) IV  Time spent: 30 minutes  Author: Berle Mull, MD [facility] Pager: [phone] 09/04/2015 2:47 PM  If 7PM-7AM,  please contact night-coverage at [email or web address], password [email or web address]

## 2015-09-04 NOTE — Progress Notes (Signed)
*  Preliminary Results* Left lower extremity venous duplex completed. Left lower extremity is negative for deep vein thrombosis. There is no evidence of left Baker's cyst.  VASCULAR LAB PRELIMINARY  ARTERIAL  ABI completed:    RIGHT    LEFT    PRESSURE WAVEFORM  PRESSURE WAVEFORM  BRACHIAL 147 Triphasic BRACHIAL 147 Triphasic  DP   DP 156 Monophasic  AT   AT    PT   PT 172 Monophasic  PER   PER    GREAT TOE  NA GREAT TOE 166 NA    RIGHT LEFT  ABI BKA 1.17  TBI " 1.13   The left ABI is within normal limits, however this may be falsely elevated given monophasic waveforms. The left TBI is within normal limits.  09/04/2015 8:37 AM Maudry Mayhew, RVT, RDCS, RDMS

## 2015-09-05 DIAGNOSIS — R600 Localized edema: Secondary | ICD-10-CM

## 2015-09-05 DIAGNOSIS — N179 Acute kidney failure, unspecified: Secondary | ICD-10-CM

## 2015-09-05 LAB — CBC
HCT: 28.7 % — ABNORMAL LOW (ref 39.0–52.0)
Hemoglobin: 9.5 g/dL — ABNORMAL LOW (ref 13.0–17.0)
MCH: 27.8 pg (ref 26.0–34.0)
MCHC: 33.1 g/dL (ref 30.0–36.0)
MCV: 83.9 fL (ref 78.0–100.0)
Platelets: 172 10*3/uL (ref 150–400)
RBC: 3.42 MIL/uL — ABNORMAL LOW (ref 4.22–5.81)
RDW: 13.5 % (ref 11.5–15.5)
WBC: 4.9 10*3/uL (ref 4.0–10.5)

## 2015-09-05 LAB — BASIC METABOLIC PANEL
Anion gap: 5 (ref 5–15)
BUN: 17 mg/dL (ref 6–20)
CO2: 29 mmol/L (ref 22–32)
Calcium: 8.9 mg/dL (ref 8.9–10.3)
Chloride: 106 mmol/L (ref 101–111)
Creatinine, Ser: 1.19 mg/dL (ref 0.61–1.24)
GFR calc Af Amer: >60 mL/min (ref >60)
GFR calc non Af Amer: >60 mL/min (ref >60)
Glucose, Bld: 131 mg/dL — ABNORMAL HIGH (ref 65–99)
Potassium: 4.3 mmol/L (ref 3.5–5.1)
Sodium: 140 mmol/L (ref 135–145)

## 2015-09-05 LAB — URINE CULTURE: Culture: NO GROWTH

## 2015-09-05 LAB — GLUCOSE, CAPILLARY
Glucose-Capillary: 128 mg/dL — ABNORMAL HIGH (ref 65–99)
Glucose-Capillary: 113 mg/dL — ABNORMAL HIGH (ref 65–99)
Glucose-Capillary: 121 mg/dL — ABNORMAL HIGH (ref 65–99)
Glucose-Capillary: 185 mg/dL — ABNORMAL HIGH (ref 65–99)
Glucose-Capillary: 103 mg/dL — ABNORMAL HIGH (ref 65–99)

## 2015-09-05 MED ORDER — CARVEDILOL 25 MG PO TABS
25.0000 mg | ORAL_TABLET | Freq: Every day | ORAL | Status: DC
  Administered 2015-09-06: 25 mg via ORAL
  Filled 2015-09-05: qty 1

## 2015-09-05 MED ORDER — ALPRAZOLAM 0.25 MG PO TABS
0.2500 mg | ORAL_TABLET | Freq: Every day | ORAL | Status: DC
  Administered 2015-09-05 – 2015-09-06 (×2): 0.25 mg via ORAL
  Filled 2015-09-05 (×2): qty 1

## 2015-09-05 MED ORDER — BACID PO TABS
2.0000 | ORAL_TABLET | Freq: Three times a day (TID) | ORAL | Status: DC
  Administered 2015-09-05 – 2015-09-06 (×3): 2 via ORAL
  Filled 2015-09-05 (×6): qty 2

## 2015-09-05 MED ORDER — CLOBETASOL PROPIONATE 0.05 % EX SOLN: 1.0000 "application " | Freq: Every day | CUTANEOUS | Status: DC | PRN

## 2015-09-05 MED ORDER — OLMESARTAN MEDOXOMIL-HCTZ 40-25 MG PO TABS: 1.0000 | ORAL_TABLET | Freq: Every day | ORAL | Status: DC

## 2015-09-05 MED ORDER — TRAMADOL HCL 50 MG PO TABS
50.0000 mg | ORAL_TABLET | Freq: Four times a day (QID) | ORAL | Status: DC | PRN
  Administered 2015-09-06: 50 mg via ORAL
  Filled 2015-09-05: qty 1

## 2015-09-05 MED ORDER — COLLAGENASE 250 UNIT/GM EX OINT
TOPICAL_OINTMENT | Freq: Every day | CUTANEOUS | Status: DC
  Administered 2015-09-05 – 2015-09-06 (×2): via TOPICAL
  Filled 2015-09-05: qty 30

## 2015-09-05 MED ORDER — MELOXICAM 7.5 MG PO TABS
15.0000 mg | ORAL_TABLET | Freq: Every day | ORAL | Status: DC
  Administered 2015-09-06: 15 mg via ORAL
  Filled 2015-09-05: qty 2

## 2015-09-05 MED ORDER — IRBESARTAN 300 MG PO TABS
300.0000 mg | ORAL_TABLET | Freq: Every day | ORAL | Status: DC
  Administered 2015-09-05: 300 mg via ORAL
  Filled 2015-09-05: qty 1

## 2015-09-05 MED ORDER — CEPHALEXIN 500 MG PO CAPS
500.0000 mg | ORAL_CAPSULE | Freq: Four times a day (QID) | ORAL | Status: DC
  Administered 2015-09-05 – 2015-09-06 (×5): 500 mg via ORAL
  Filled 2015-09-05 (×5): qty 1

## 2015-09-05 MED ORDER — DOXYCYCLINE HYCLATE 100 MG PO TABS: 100.0000 mg | ORAL_TABLET | Freq: Two times a day (BID) | ORAL | Status: DC

## 2015-09-05 MED ORDER — SPIRONOLACTONE 25 MG PO TABS
25.0000 mg | ORAL_TABLET | Freq: Every day | ORAL | Status: DC
  Administered 2015-09-05 – 2015-09-06 (×2): 25 mg via ORAL
  Filled 2015-09-05 (×2): qty 1

## 2015-09-05 MED ORDER — FUROSEMIDE 40 MG PO TABS
40.0000 mg | ORAL_TABLET | Freq: Every day | ORAL | Status: DC
  Administered 2015-09-05 – 2015-09-06 (×2): 40 mg via ORAL
  Filled 2015-09-05 (×2): qty 1

## 2015-09-05 MED ORDER — HYDROCHLOROTHIAZIDE 25 MG PO TABS
25.0000 mg | ORAL_TABLET | Freq: Every day | ORAL | Status: DC
  Administered 2015-09-05: 25 mg via ORAL
  Filled 2015-09-05: qty 1

## 2015-09-05 MED ORDER — NAPROXEN SODIUM 220 MG PO TABS: 220.0000 mg | ORAL_TABLET | Freq: Two times a day (BID) | ORAL | Status: DC

## 2015-09-05 NOTE — Progress Notes (Signed)
PT Cancellation Note  Patient Details Name: FISHER MEHR MRN: [id number] DOB: [birth year]   Cancelled Treatment:    Reason Eval/Treat Not Completed: Medical issues which prohibited therapy.  Patient awaiting [facility] RN to assess foot for "off-loading" shoe.  Once WOC RN clears patient for weight bearing or orders off-loading shoe, PT will return to assess ambulation/mobility.   Thank you,   Shanna Cisco 09/05/2015, 1:48 PM

## 2015-09-05 NOTE — Progress Notes (Signed)
[facility] Progress Note  Patient: [name] [id number]   PCP: [name], MD DOB: April 09, 1956   DOA: 09/03/2015   DOS: 09/05/2015   Date of Service: the patient was seen and examined on 09/05/2015  Subjective: Patient continues to have concerns regarding medication. Requested pharmacy to obtain medication from patient's pharmacy and medication has been updated. No pain in the leg. No chest pain or abdominal pain. Tolerating oral diet. Ambulating in the room. Nutrition: Tolerating oral diet Activity: Ambulating in the room Last BM: 09/02/2015  Assessment and Plan: 1. Cellulitis of left foot Patient presented with left leg ulcers with severe pain. MRI of the foot is negative for osteomyelitis. Leukocytosis is improving. Appears to be cellulitis. Prior history of Staphylococcus aureus MSSA. Discussed with infectious diseases as well as orthopedic on phone recommends one-week follow-up as an outpatient for the patient. Also recommend to start the patient on Keflex for 2 weeks. Continue with current pain management. Vascular ABI are unremarkable. appreciate input from the wound care. Postop boot provided, Awaiting recommendation from physical therapy.   2. Left leg edema. Likely due to cellulitis,Dopplers are negative for DVT.  3. Hypertension, chronic diastolic heart failure. Medication dose has been adjusted as per the pharmacy information.  continue home medication  4. Diabetes mellitus, controlled. Hemoglobin A1c 6.5 in January. Next time recheck it. Continuing with insulin regimen. Holding metformin.  5. Peripheral vascular disease, coronary artery disease. Continuing aspirin and Plavix statins Imdur and Coreg.  6. GERD. Continuing PPI.  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 The symptoms were associated with fevers and chills No drainage. No nausea vomiting or diarrhea. Patient reports CBGs have remained stable. He has not purchased or worn any new footwear. He call symptoms were similar to his septic presentation in May 2016 that resulted in amputation the patient presented to the ER for treatment" Daily update, Procedures: 09/04/2015 MRI negative for any osteomyelitis. Antibiotics narrowed to vancomycin. 09/05/2015 antibiotic switched to Keflex  Consultants: phone consultation with infectious disease as well as [facility]  Antibiotics: Anti-infectives    Start     Dose/Rate Route Frequency Ordered Stop   09/05/15 1230  cephALEXin (KEFLEX) capsule 500 mg     500 mg Oral 4 times per day 09/05/15 1224     09/05/15 1230  doxycycline (VIBRA-TABS) tablet 100 mg  Status:  Discontinued     100 mg Oral Every 12 hours 09/05/15 1225 09/05/15 1226   09/03/15 2200  vancomycin (VANCOCIN) 1,250 mg in sodium chloride 0.9 % 250 mL IVPB  Status:  Discontinued     1,250 mg 166.7 mL/hr over 90 Minutes Intravenous Every 24 hours 09/03/15  [id number] 09/05/15 1224   09/03/15 1200  piperacillin-tazobactam (ZOSYN) IVPB 3.375 g  Status:  Discontinued     3.375 g 12.5 mL/hr over 240 Minutes  Intravenous 3 times per day 09/03/15 0822 09/03/15 0829   09/03/15 1000  imipenem-cilastatin (PRIMAXIN) 500 mg in sodium chloride 0.9 % 100 mL IVPB  Status:  Discontinued     500 mg 200 mL/hr over 30 Minutes Intravenous  Once 09/03/15 0747 09/03/15 0829   09/03/15 1000  imipenem-cilastatin (PRIMAXIN) 500 mg in sodium chloride 0.9 % 100 mL IVPB  Status:  Discontinued     500 mg 200 mL/hr over 30 Minutes Intravenous 3 times per day 09/03/15 0829 09/04/15 1453   09/03/15 0830  vancomycin (VANCOCIN) IVPB 1000 mg/200 mL premix  Status:  Discontinued     1,000 mg 200 mL/hr over 60 Minutes Intravenous  Once 09/03/15 0747 09/03/15 0831   09/03/15 0300  vancomycin (VANCOCIN) IVPB 1000 mg/200 mL premix     1,000 mg 200 mL/hr over 60 Minutes Intravenous  Once 09/03/15 0252 09/03/15 0532   09/03/15 0300  piperacillin-tazobactam (ZOSYN) IVPB 3.375 g     3.375 g 12.5 mL/hr over 240 Minutes Intravenous  Once 09/03/15 0252 09/03/15 [id number]      Family Communication: family was present at bedside, at the time of interview.   Disposition:  Expected discharge date: 09/06/2015 Barriers to safe discharge: Blood cultures, improvement in cellulitis   Intake/Output Summary (Last 24 hours) at 09/05/15 1716 Last data filed at 09/05/15 1100  Gross per 24 hour  Intake 2698.75 ml  Output   2800 ml  Net -101.25 ml   Filed Weights   09/03/15 0635 09/04/15 0631 09/05/15 0505  Weight: 111.5 kg (245 lb 13 oz) 118.933 kg (262 lb 3.2 oz) 120.7 kg (266 lb 1.5 oz)    Objective: Physical Exam: Filed Vitals:   09/04/15 1356 09/04/15 2049 09/05/15 0505 09/05/15 1421  BP: 138/60 166/63 147/71 128/58  Pulse: 65 59 61 69  Temp: 98.1 F (36.7 C) 98 F (36.7 C) 97.5 F (36.4 C) 99.1 F (37.3 C)  TempSrc: Oral Oral Oral Oral  Resp: 18 18 18 17   Height:      Weight:   120.7 kg (266 lb 1.5 oz)   SpO2: 98% 99% 99% 97%    General: Appear in mild distress, to open ulcer on the left foot bottom, no other Rash; Oral  Mucosa moist. Cardiovascular: S1 and S2 Present, no Murmur, no JVD Respiratory: Bilateral Air entry present and Clear to Auscultation, no Crackles, no wheezes Abdomen: Bowel Sound present, Soft and no tenderness Extremities: no Pedal edema, no calf tenderness, presence of ulcers unchanged  Data Reviewed: CBC:  Recent Labs Lab 09/03/15 0413 09/04/15 0238 09/05/15 0510  WBC 8.5 5.7 4.9  NEUTROABS 6.0  --   --   HGB 11.0* 9.5* 9.5*  HCT 31.7* 27.6* 28.7*  MCV 82.8 84.1 83.9  PLT 197 171 [id number]   Basic Metabolic Panel:  Recent Labs Lab 09/03/15 0413 09/04/15 0238 09/05/15 0510  NA 138 140 140  K 4.0 4.3 4.3  CL 103 105 106  CO2 25 26 29   GLUCOSE 155* 169* 131*  BUN 31* 23* 17  CREATININE 1.57* 1.29* 1.19  CALCIUM 9.5 9.0 8.9   Liver Function Tests:  Recent Labs Lab 09/04/15 0238  AST 15  ALT 11*  ALKPHOS 89  BILITOT 0.5  PROT 5.7*  ALBUMIN 2.8*   No results for input(s): LIPASE, AMYLASE in the  last 168 hours. No results for input(s): AMMONIA in the last 168 hours.  Cardiac Enzymes: No results for input(s): CKTOTAL, CKMB, CKMBINDEX, TROPONINI in the last 168 hours.  BNP (last 3 results) No results for input(s): BNP in the last 8760 hours.  CBG:  Recent Labs Lab 09/04/15 1624 09/04/15 2122 09/05/15 0623 09/05/15 1154 09/05/15 1700  GLUCAP 160* 181* 113* 121* 185*    Recent Results (from the past 240 hour(s))  Blood culture (routine x 2)     Status: None (Preliminary result)   Collection Time: 09/03/15  4:32 AM  Result Value Ref Range Status   Specimen Description BLOOD RIGHT ARM  Final   Special Requests BOTTLES DRAWN AEROBIC AND ANAEROBIC 5ML  Final   Culture NO GROWTH 2 DAYS  Final   Report Status PENDING  Incomplete  Blood culture (routine x 2)     Status: None (Preliminary result)   Collection Time: 09/03/15  4:32 AM  Result Value Ref Range Status   Specimen Description BLOOD LEFT ARM  Final   Special Requests BOTTLES DRAWN AEROBIC AND  ANAEROBIC 5ML  Final   Culture NO GROWTH 2 DAYS  Final   Report Status PENDING  Incomplete  Culture, Urine     Status: None   Collection Time: 09/03/15  6:44 PM  Result Value Ref Range Status   Specimen Description URINE, CLEAN CATCH  Final   Special Requests NONE  Final   Culture NO GROWTH 2 DAYS  Final   Report Status 09/05/2015 FINAL  Final     Studies: No results found.   Scheduled Meds: . ALPRAZolam  0.25 mg Oral Daily  . aspirin EC  81 mg Oral Daily  . atorvastatin  40 mg Oral Daily  . [START ON 09/06/2015] carvedilol  25 mg Oral Q breakfast  . cephALEXin  500 mg Oral 4 times per day  . clopidogrel  75 mg Oral Daily  . collagenase   Topical Daily  . enoxaparin (LOVENOX) injection  40 mg Subcutaneous Q24H  . feeding supplement (GLUCERNA SHAKE)  237 mL Oral BID BM  . fentaNYL  50 mcg Transdermal Q72H  . furosemide  40 mg Oral Daily  . irbesartan  300 mg Oral QHS   And  . hydrochlorothiazide  25 mg Oral QHS  . insulin aspart  0-15 Units Subcutaneous TID WC  . insulin aspart  0-5 Units Subcutaneous QHS  . insulin aspart  5 Units Subcutaneous TID AC  . insulin glargine  40 Units Subcutaneous QHS  . isosorbide mononitrate  120 mg Oral Daily  . lactobacillus acidophilus  2 tablet Oral TID  . [START ON 09/06/2015] meloxicam  15 mg Oral Q breakfast  . multivitamin with minerals  1 tablet Oral Daily  . omega-3 acid ethyl esters  2 g Oral BID  . oxyCODONE-acetaminophen  1-2 tablet Oral 3 times per day  . pantoprazole  40 mg Oral QAC breakfast  . pregabalin  100 mg Oral TID  . rOPINIRole  2 mg Oral QHS  . spironolactone  25 mg Oral Daily  . tamsulosin  0.4 mg Oral Daily   Continuous Infusions:   PRN Meds: alum & mag hydroxide-simeth, HYDROmorphone, Linaclotide, ondansetron **OR** ondansetron (ZOFRAN) IV, traMADol  Time spent: 30 minutes  Author: Berle Mull, MD [facility] Pager: [phone] 09/05/2015 5:16 PM  If 7PM-7AM, please contact night-coverage at  [email or web address], password [facility]

## 2015-09-05 NOTE — Consult Note (Addendum)
WOC wound consult note Reason for Consult: Consult requested for left foot wounds. According to the patient, he will be followed after discharge by the ortho service.  X-ray did not indicate osteomyelitis. Discussed plan of care with primary team. Wound type: 2 full thickness wounds to left plantar foot .8X.8cm and .5X.5cm. Both 100% dry tightly adhered eschar, no odor, drainage, or fluctuance. Dressing procedure/placement/frequency: Santyl for chemical debridement to assist with removal of nonviable tissue. Blue post-op shoe ordered as requested by physical therapy team in their note. Will defer to ortho service for further plan of care. Please re-consult if further assistance is needed. Thank-you,  Julien Girt MSN, [place], [place], [place], [place]

## 2015-09-05 NOTE — Progress Notes (Signed)
Orthopedic Tech Progress Note Patient Details:  [name] [birth year] [id number]  Ortho Devices Type of Ortho Device: Postop shoe/boot Ortho Device/Splint Location: LLE Ortho Device/Splint Interventions: Ordered, Application   Braulio Bosch 09/05/2015, 3:21 PM

## 2015-09-06 DIAGNOSIS — E785 Hyperlipidemia, unspecified: Secondary | ICD-10-CM

## 2015-09-06 LAB — BASIC METABOLIC PANEL
Anion gap: 8 (ref 5–15)
BUN: 17 mg/dL (ref 6–20)
CO2: 28 mmol/L (ref 22–32)
Calcium: 9.3 mg/dL (ref 8.9–10.3)
Chloride: 104 mmol/L (ref 101–111)
Creatinine, Ser: 1.26 mg/dL — ABNORMAL HIGH (ref 0.61–1.24)
GFR calc Af Amer: >60 mL/min (ref >60)
GFR calc non Af Amer: 60 mL/min — ABNORMAL LOW (ref >60)
Glucose, Bld: 79 mg/dL (ref 65–99)
Potassium: 4 mmol/L (ref 3.5–5.1)
Sodium: 140 mmol/L (ref 135–145)

## 2015-09-06 LAB — CBC
HCT: 28 % — ABNORMAL LOW (ref 39.0–52.0)
Hemoglobin: 9.5 g/dL — ABNORMAL LOW (ref 13.0–17.0)
MCH: 28.2 pg (ref 26.0–34.0)
MCHC: 33.9 g/dL (ref 30.0–36.0)
MCV: 83.1 fL (ref 78.0–100.0)
Platelets: 177 10*3/uL (ref 150–400)
RBC: 3.37 MIL/uL — ABNORMAL LOW (ref 4.22–5.81)
RDW: 13.6 % (ref 11.5–15.5)
WBC: 6.4 10*3/uL (ref 4.0–10.5)

## 2015-09-06 LAB — GLUCOSE, CAPILLARY
Glucose-Capillary: 78 mg/dL (ref 65–99)
Glucose-Capillary: 146 mg/dL — ABNORMAL HIGH (ref 65–99)

## 2015-09-06 MED ORDER — GLUCERNA SHAKE PO LIQD: 237.0000 mL | Freq: Two times a day (BID) | ORAL | Status: DC

## 2015-09-06 MED ORDER — CEPHALEXIN 500 MG PO CAPS: 500.0000 mg | ORAL_CAPSULE | Freq: Four times a day (QID) | ORAL | Status: AC

## 2015-09-06 MED ORDER — FUROSEMIDE 80 MG PO TABS: 40.0000 mg | ORAL_TABLET | Freq: Every day | ORAL | Status: DC

## 2015-09-06 MED ORDER — COLLAGENASE 250 UNIT/GM EX OINT
TOPICAL_OINTMENT | Freq: Every day | CUTANEOUS | Status: DC
Start: 2015-09-06 — End: 2020-05-30

## 2015-09-06 MED ORDER — BACID PO TABS: 2.0000 | ORAL_TABLET | Freq: Three times a day (TID) | ORAL | Status: DC

## 2015-09-06 NOTE — Discharge Instructions (Signed)
° °  It is important that you read following instructions as well as go over your medication list with RN to help you understand your care after this hospitalization. Please request your primary care physician to go over all Hospital Tests and Procedure/Radiological results at the follow up,  Please get all Hospital records sent to your PCP by signing hospital release before you go home.    Do not drive, operating heavy machinery, perform activities at heights, swimming or participation in water activities or provide baby sitting services if while your are on Pain, Sleep and Anxiety Medications; until you have been seen by Primary Care Physician or a Neurologist and advised to do so again.  Do not take more than prescribed Pain, Sleep and Anxiety Medications.  You were cared for by a hospitalist during your hospital stay. If you have any questions about your discharge medications or the care you received while you were in the hospital after you are discharged, you can call the unit and ask to speak with the hospitalist on call if the hospitalist that took care of you is not available.   Once you are discharged, your primary care physician will handle any further medical issues.  Please note that NO REFILLS for any discharge medications will be authorized once you are discharged, as it is imperative that you return to your primary care physician (or establish a relationship with a primary care physician if you do not have one) for your aftercare needs so that they can reassess your need for medications and monitor your lab values.  You Must read complete instructions/literature along with all the possible adverse reactions/side effects for all the Medicines you take and that have been prescribed to you. Take any new Medicines after you have completely understood and accept all the possible adverse reactions/side effects.  Wear Seat belts while driving.  If you have smoked or chewed Tobacco in the  last 2 yrs please stop smoking and/or stop any Recreational drug use.

## 2015-09-06 NOTE — Progress Notes (Signed)
Pt ready for discharge. Instructions/education reviewed with pt and all questions/concerns addressed. IV removed and all belongings gathered. Pt will be transported out via wheelchair to friend's car. Will continue to monitor.

## 2015-09-06 NOTE — Evaluation (Signed)
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  Clinical Impression   Patient evaluated by Physical Therapy with no further acute PT needs identified. All education has been completed and the patient has no further questions.  See below for any follow-up Physical Therapy or equipment needs. Acute PT is signing off. Thank you for this referral.     Follow Up Recommendations Outpatient PT (when appropriate for further gait training; The potential need for Outpatient PT can be addressed at Ortho follow-up appointments.)    Equipment Recommendations  None recommended by PT    Recommendations for Other Services Other (comment) (Prosthetist/Orthotist to assess for L shoe modifications for pressure redistribution -- pt tells me he has an appointment with the folks at [facility] in January)     Precautions / Restrictions Restrictions Weight Bearing Restrictions: No RLE Weight Bearing: Weight bearing as tolerated Other Position/Activity Restrictions: No Weight bearing restrictions noted; orders for postop shoe L foot      Mobility  Bed Mobility Overal bed mobility: Independent Bed  Mobility: Supine to Sit;Sit to Supine     Supine to sit: Independent Sit to supine: Independent   General bed mobility comments: no difficultiy  Transfers Overall transfer level: Modified independent Equipment used:  (UEs on bed and knee scooter bars) Transfers: Sit to/from [facility] Sit to Stand: Modified independent (Device/Increase time) Stand pivot transfers: Modified independent (Device/Increase time)       General transfer comment: Good use of locked knee scooter for UEs to balance during transition to scooter  Ambulation/Gait Ambulation/Gait assistance: Modified independent (Device/Increase time) Ambulation Distance (Feet): 340 Feet Assistive device:  (Knee scooter under R residual limb) Gait Pattern/deviations:  (knee scooter-ing) Gait velocity: pretty quick   General Gait Details: Managing quite well with knee scooter  Stairs            Wheelchair Mobility    Modified Rankin (Stroke Patients Only)       Balance             Standing balance-Leahy Scale: Fair (approaching good)                               Pertinent Vitals/Pain Pain Assessment: Faces Pain Score: 5  Faces Pain Scale: Hurts little more Pain Location: Reports contstant pain related to diabetic peripheral neuropathy Pain Descriptors / Indicators:  ("nerve pain") Pain Intervention(s): Monitored during session    Home Living Family/patient expects to be discharged to:: Private residence Living Arrangements: Spouse/significant other Available Help at Discharge: Family;Available 24 hours/day (wife and stepson) Type of Home: Mobile home Home Access: Ramped entrance     Home Layout: One level Home Equipment: Crutches;Walker -  2 wheels;Wheelchair - Brewing technologist;Wheelchair - power (knee scooter)      Prior Function Level of Independence: Independent with assistive device(s)         Comments: disabled     Hand Dominance   Dominant Hand:  Right    Extremity/Trunk Assessment   Upper Extremity Assessment: Overall WFL for tasks assessed           Lower Extremity Assessment: RLE deficits/detail;LLE deficits/detail RLE Deficits / Details: BKA; reports recent breakdown R residual limb -- can't wear prosthesis at this time; plans to follow up with prosthetist in Jan; noted R knee full AROM into flexion and extension; encouraged [name] to be diligent about keeping his R knee extension range for getting back to his prosthesis LLE Deficits / Details: wounds L foot; TED hose on, so did not remove to assess; please refer to [place] note; hip, knee WFL; able to use LLE for propulsion of knee scooter in postop shoe  Cervical / Trunk Assessment: Normal  Communication   Communication: No difficulties  Cognition Arousal/Alertness: Awake/alert Behavior During Therapy: WFL for tasks assessed/performed Overall Cognitive Status: Within Functional Limits for tasks assessed                      General Comments      Exercises        Assessment/Plan    PT Assessment All further PT needs can be met in the next venue of care  PT Diagnosis Acute pain   PT Problem List Pain;Decreased skin integrity;Decreased activity tolerance;Decreased mobility;Decreased strength;Decreased range of motion  PT Treatment Interventions     PT Goals (Current goals can be found in the Care Plan section) Acute Rehab PT Goals Patient Stated Goal: not stated PT Goal Formulation: All assessment and education complete, DC therapy    Frequency     Barriers to discharge        Co-evaluation               End of Session   Activity Tolerance: Patient tolerated treatment well Patient left: in bed;with call bell/phone within reach Nurse Communication: Mobility status         Time: 9914-4458 PT Time Calculation (min) (ACUTE ONLY): 16 min   Charges:   PT Evaluation $Initial PT Evaluation Tier I: 1 Procedure     PT G CodesQuin [name] 09/06/2015, 9:17 AM  [name], [place] Pager [phone] Office [phone]

## 2015-09-06 NOTE — Discharge Summary (Signed)
[facility] Discharge Summary   Patient: [name] YYQ:[id number]   PCP: [name], MD DOB: 1956/05/23   Date of admission: 09/03/2015   Date of discharge: 09/06/2015    Discharge Diagnoses:  Principal Problem:   Cellulitis of left foot Active Problems:   PVD (peripheral vascular disease) ([place])   HTN (hypertension)   Former tobacco use   GERD (gastroesophageal reflux disease)   Diabetes mellitus, insulin dependent (IDDM), controlled ([place])   Hyperlipidemia   Leg edema, left   Acute renal failure (HCC)   CAD (dz of distal, mid and proximal RCA with implantation of 3 overlapping drug-eluting stent,)   Chronic diastolic heart failure, NYHA class 2 ([place])   Recommendations for Outpatient Follow-up:  1. Maintain follow-up appointment with orthopedics this Thursday 09/08/2015 2. Follow-up with ID as needed after following up with orthopedics 3. Follow-up with PCP with BMP for renal function. 4. May need outpatient physical therapy after discussion with orthopedics.  Diet recommendation: heart healthy carb modified diet   Activity: The patient is advised to gradually reintroduce usual activities. 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 The symptoms were associated with fevers and chills No drainage. No nausea vomiting or diarrhea. Patient reports CBGs have remained stable. He has not purchased or worn any new footwear. He call symptoms were similar to his septic presentation in May 2016 that resulted in amputation the patient presented to the ER for treatment"  Hospital Course:  Summary of his active problems in the hospital is as following. 1. Cellulitis of left foot Patient presented with left leg ulcers with severe pain. MRI of the foot is negative for osteomyelitis, and shows cellulitis. Prior history of Staphylococcus aureus MSSA. Discussed with infectious diseases as well as orthopedic Dr Louanne Skye on phone. recommends one-week follow-up as an outpatient for the patient. Also recommend to start the patient on Keflex for 2 weeks. Continue with current pain management. Vascular ABI are unremarkable. appreciate input from the wound care. Postop boot provided, physical therapy recommended outpatient physical therapy after consultation with orthopedic as an outpatient. Patient has an appointment on Thursday with orthopedics.    2. Left leg edema. Likely due to cellulitis,Dopplers are negative for DVT.  3. Hypertension, chronic diastolic heart faireducing the dose of Lasix from 80 mg to 40 mg due to presentation with renal insufficiency. Recommend patient to follow-up with PCP to recheck BMP as well as adjust medications.  4. Diabetes mellitus, controlled. Hemoglobin A1c 6.5 in January. this admission 7.5. Resuming home regimen recommend patient follow-up with PCP regarding diabetes medication adjustment as well.  5. Peripheral vascular disease, coronary artery disease. Continuing aspirin and Plavix statins Imdur and Coreg.  6. GERD. Continuing PPI.  All other  chronic medical condition were stable during the hospitalization.  Patient was seen by physical  therapy, who recommended outpatient follow-up with orthopedics. On the day of the discharge the patient's vitals remained stable as well as renal function, and no other acute medical condition were reported by patient. the patient was felt safe to be dischargehome with family support.  Procedures and Results:  Lower Extremity Arterial Evaluation: The left ABI is within normal limits, however this may be falsely elevated given monophasic waveforms. The left TBI is within normal limits.  Left Lower Extremity Venous Duplex Evaluation No evidence of deep vein thrombosis involving the visualized  veins of the left lower extremity. - No evidence of Baker&'s cyst on the left  Consultations:  Phone consultation with orthopedics as well as infectious disease   Discharge Exam: Filed Weights   09/04/15 0631 09/05/15 0505 09/06/15 0522  Weight: 118.933 kg (262 lb 3.2 oz) 120.7 kg (266 lb 1.5 oz) 114.805 kg (253 lb 1.6 oz)   Filed Vitals:   09/06/15 0522 09/06/15 1300  BP: 141/64 139/74  Pulse: 65 63  Temp: 98.4 F (36.9 C) 98.2 F (36.8 C)  Resp: 18 18   General: Appear in no distress, no Rash; Oral Mucosa moist Cardiovascular: S1 and S2 Present, no Murmur, no JVD Respiratory: Bilateral Air entry present and Clear to Auscultation, no Crackles, no wheezes Abdomen: Bowel Sound present, Soft and no tenderness Extremities: no Pedal edema, no calf tenderness right BKA Neurology: Grossly no focal neuro deficit.  DISCHARGE MEDICATION: Discharge Instructions    Diet - low sodium heart healthy    Complete by:  As directed      Discharge wound care:    Complete by:  As directed   Dressing change daily, Santyl for chemical debridement to assist with removal of nonviable tissue. Blue post-op shoe while ambulating.     Increase activity slowly    Complete by:  As directed           Current Discharge Medication List    START taking these medications   Details  cephALEXin (KEFLEX) 500 MG  capsule Take 1 capsule (500 mg total) by mouth every 6 (six) hours. Qty: 46 capsule, Refills: 0    collagenase (SANTYL) ointment Apply topically daily. Qty: 15 g, Refills: 0    feeding supplement, GLUCERNA SHAKE, (GLUCERNA SHAKE) LIQD Take 237 mLs by mouth 2 (two) times daily between meals. Qty: 12 Can, Refills: 0    lactobacillus acidophilus (BACID) TABS tablet Take 2 tablets by mouth 3 (three) times daily. Qty: 30 each, Refills: 0      CONTINUE these medications which have CHANGED   Details  furosemide (LASIX) 80 MG tablet Take 0.5 tablets (40 mg total) by mouth daily. Qty: 30 tablet, Refills: 0      CONTINUE these medications which have NOT CHANGED   Details  ALPRAZolam (XANAX) 0.25 MG tablet Take 0.25 mg by mouth daily. Refills: 0    aspirin EC 81 MG tablet Take 81 mg by mouth daily.    atorvastatin (LIPITOR) 40 MG tablet Take 40 mg by mouth daily.    carvedilol (COREG) 25 MG tablet Take 25 mg by mouth daily. Refills: 0    clobetasol (TEMOVATE) 0.05 % external solution Apply 1 application topically daily as needed (dry scalp).    clopidogrel (PLAVIX) 75 MG tablet Take 75 mg by mouth daily.     clotrimazole-betamethasone (LOTRISONE) cream Apply 1 application topically daily as needed (dry skin).  eplerenone (INSPRA) 50 MG tablet Take 50 mg by mouth daily. Refills: 0    fentaNYL (DURAGESIC - DOSED MCG/HR) 50 MCG/HR Place 1 patch (50 mcg total) onto the skin every 3 (three) days. Qty: 3 patch, Refills: 0    glyBURIDE-metformin (GLUCOVANCE) 2.5-500 MG per tablet Take 2 tablets by mouth 2 (two) times daily with a meal.     HYDROmorphone (DILAUDID) 2 MG tablet Take 1 tablet (2 mg total) by mouth every 4 (four) hours as needed. Qty: 20 tablet, Refills: 0    insulin glargine (LANTUS) 100 unit/mL SOPN Inject 40 Units into the skin at bedtime.    Insulin Glulisine (APIDRA) 100 UNIT/ML Solostar Pen Inject 10 Units into the skin 3 (three) times daily with meals.      isosorbide mononitrate (IMDUR) 120 MG 24 hr tablet Take 120 mg by mouth daily.    Linaclotide (LINZESS) 145 MCG CAPS capsule Take 1 capsule (145 mcg total) by mouth daily as needed. Qty: 30 capsule, Refills: 2   Associated Diagnoses: Change in bowel habits    meloxicam (MOBIC) 15 MG tablet Take 15 mg by mouth daily. Refills: 0    Multiple Vitamin (MULTIVITAMIN WITH MINERALS) TABS tablet Take 1 tablet by mouth daily.    naproxen sodium (ALEVE) 220 MG tablet Take 220 mg by mouth 2 (two) times daily with a meal.    nitroGLYCERIN (NITRO-BID) 2 % ointment Apply topically daily as needed (esophogeal spasms).    nitroGLYCERIN (NITROSTAT) 0.4 MG SL tablet Place 1 tablet (0.4 mg total) under the tongue every 5 (five) minutes as needed (as needed for esophageal spasm). Qty: 30 tablet, Refills: 1    olmesartan-hydrochlorothiazide (BENICAR HCT) 40-25 MG tablet Take 1 tablet by mouth at bedtime. Refills: 0    omega-3 acid ethyl esters (LOVAZA) 1 G capsule Take 2 g by mouth 2 (two) times daily.    ondansetron (ZOFRAN-ODT) 4 MG disintegrating tablet Take 4 mg by mouth 4 (four) times daily as needed for nausea.    oxyCODONE-acetaminophen (PERCOCET/ROXICET) 5-325 MG per tablet Take 1 tablet by mouth every 8 (eight) hours as needed. Qty: 30 tablet, Refills: 0    pantoprazole (PROTONIX) 40 MG tablet Take 1 tablet (40 mg total) by mouth daily before breakfast. Qty: 30 tablet, Refills: 3    Polyethyl Glycol-Propyl Glycol (SYSTANE ULTRA OP) Place 1 drop into both eyes as needed (for dry eyes).    pregabalin (LYRICA) 100 MG capsule Take 100 mg by mouth 3 (three) times daily.    rOPINIRole (REQUIP) 2 MG tablet Take 2 mg by mouth at bedtime.     Tamsulosin HCl (FLOMAX) 0.4 MG CAPS Take 0.4 mg by mouth daily.    traMADol (ULTRAM) 50 MG tablet Take 50 mg by mouth every 6 (six) hours as needed. Refills: 0      STOP taking these medications     insulin glargine (LANTUS) 100 UNIT/ML injection       insulin glulisine (APIDRA) 100 UNIT/ML injection        Allergies  Allergen Reactions  . Ivp Dye [Iodinated Diagnostic Agents] Anaphylaxis    Breathing problems  . Adhesive [Tape] Rash    Rash after 2-3 days use   Follow-up Information    Follow up with [name],[name], MD. Schedule an appointment as soon as possible for a visit in 1 week.   Specialty:  Orthopedic Surgery   Contact information:   [place] [address] [phone]  Follow up with [name], MD. Call in 1 week.   Specialty:  Endocrinology   Why:  bmp and medication adjustment for BP and CHF   Contact information:   [address] [place] [address] [phone]       Follow up with [name], MD. Call in 1 week.   Specialty:  Infectious Diseases   Why:  after orthopedic appointment   Contact information:   [place]  [address] [phone]       The results of significant diagnostics from this hospitalization (including imaging, microbiology, ancillary and laboratory) are listed below for reference.    Significant Diagnostic Studies: Mri Left Foot Without Contrast  09/04/2015  CLINICAL DATA:  Left lower extremity pain and swelling with erythema along the dorsal lateral aspect of the foot for 3 days. Plantar foot ulceration. History of diabetes and right below the knee amputation. EXAM: MRI OF THE LEFT FOREFOOT WITHOUT CONTRAST TECHNIQUE: Multiplanar, multisequence MR imaging was performed. No intravenous contrast was administered. COMPARISON:  Radiographs 09/03/2015.  MRI 11/24/2012. FINDINGS: Since the previous MRI, the patient has undergone amputation of the fourth toe. The remaining toes and metatarsals appear stable without evidence of recurrent osteomyelitis. There are stable degenerative changes at the Lisfranc joint without subluxation. Subcutaneous edema throughout the dorsal and lateral aspect of the forefoot is  similar to the previous study. Likewise, there is stable mild muscular T2 hyperintensity. Pressure lesions or areas of skin ulceration are noted plantar to the fourth and fifth metatarsal heads. There is no underlying focal fluid collection or soft tissue emphysema. Mild nodular thickening of the plantar fascia is noted, without change. IMPRESSION: 1. Interval amputation of the fourth toe. No evidence of recurrent osteomyelitis. 2. Dorsal subcutaneous edema consistent with cellulitis. No evidence of focal abscess. 3. 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 Electronically Signed   By: Richardean Sale M.D.   On: 09/04/2015 11:15   Dg Foot Complete Left  09/03/2015  CLINICAL DATA:  Acute onset of left foot pain, with ulceration at the fifth metatarsophalangeal joint. Initial encounter. EXAM: LEFT FOOT - COMPLETE 3+ VIEW COMPARISON:  Left foot radiographs performed 05/27/2013 FINDINGS: The known soft tissue ulceration is partially characterized. No definite osseous erosion is seen to suggest osteomyelitis, though it cannot be excluded on the basis of radiograph. The patient is status post amputation of the fourth proximal phalanx. Visualized joint spaces are grossly preserved. A plantar calcaneal spur is incidentally noted. IMPRESSION: Known soft tissue ulceration is partially characterized. No definite evidence for osteomyelitis, though it cannot be excluded on the basis of radiograph. Electronically Signed   By: Garald Balding M.D.   On: 09/03/2015 04:01    Microbiology: Recent Results (from the past 240 hour(s))  Blood culture (routine x 2)     Status: None (Preliminary result)   Collection Time: 09/03/15  4:32 AM  Result Value Ref Range Status   Specimen Description BLOOD RIGHT ARM  Final   Special Requests BOTTLES DRAWN AEROBIC AND ANAEROBIC 5ML  Final   Culture NO GROWTH 3 DAYS  Final   Report Status PENDING  Incomplete  Blood culture (routine x 2)      Status: None (Preliminary result)   Collection Time: 09/03/15  4:32 AM  Result Value Ref Range Status   Specimen Description BLOOD LEFT ARM  Final   Special Requests BOTTLES DRAWN AEROBIC AND ANAEROBIC 5ML  Final   Culture NO  GROWTH 3 DAYS  Final   Report Status PENDING  Incomplete  Culture, Urine     Status: None   Collection Time: 09/03/15  6:44 PM  Result Value Ref Range Status   Specimen Description URINE, CLEAN CATCH  Final   Special Requests NONE  Final   Culture NO GROWTH 2 DAYS  Final   Report Status 09/05/2015 FINAL  Final     Labs: CBC:  Recent Labs Lab 09/03/15 0413 09/04/15 0238 09/05/15 0510 09/06/15 0530  WBC 8.5 5.7 4.9 6.4  NEUTROABS 6.0  --   --   --   HGB 11.0* 9.5* 9.5* 9.5*  HCT 31.7* 27.6* 28.7* 28.0*  MCV 82.8 84.1 83.9 83.1  PLT 197 171 172 161   Basic Metabolic Panel:  Recent Labs Lab 09/03/15 0413 09/04/15 0238 09/05/15 0510 09/06/15 0530  NA 138 140 140 140  K 4.0 4.3 4.3 4.0  CL 103 105 106 104  CO2 25 26 29 28   GLUCOSE 155* 169* 131* 79  BUN 31* 23* 17 17  CREATININE 1.57* 1.29* 1.19 1.26*  CALCIUM 9.5 9.0 8.9 9.3   Liver Function Tests:  Recent Labs Lab 09/04/15 0238  AST 15  ALT 11*  ALKPHOS 89  BILITOT 0.5  PROT 5.7*  ALBUMIN 2.8*   CBG:  Recent Labs Lab 09/05/15 1154 09/05/15 1700 09/05/15 2112 09/06/15 0623 09/06/15 1131  GLUCAP 121* 185* 103* 78 146*   ESR 43, CRP 1.4   Time spent: 30 minutes  Signed:  Bergen Magner  [facility] 09/06/2015, 1:49 PM

## 2015-09-08 LAB — CULTURE, BLOOD (ROUTINE X 2)
Culture: NO GROWTH
Culture: NO GROWTH

## 2015-09-18 ENCOUNTER — Emergency Department (HOSPITAL_COMMUNITY)
Admission: EM | Admit: 2015-09-18 | Discharge: 2015-09-18 | Disposition: A | Discharge status: Home / Self Care | Payer: BLUE CROSS/BLUE SHIELD | Attending: Emergency Medicine | Admitting: Emergency Medicine

## 2015-09-18 ENCOUNTER — Emergency Department (HOSPITAL_COMMUNITY): Payer: BLUE CROSS/BLUE SHIELD

## 2015-09-18 ENCOUNTER — Encounter (HOSPITAL_COMMUNITY): Payer: Self-pay | Admitting: Emergency Medicine

## 2015-09-18 DIAGNOSIS — Z794 Long term (current) use of insulin: Secondary | ICD-10-CM | POA: Diagnosis not present

## 2015-09-18 DIAGNOSIS — Z791 Long term (current) use of non-steroidal anti-inflammatories (NSAID): Secondary | ICD-10-CM | POA: Diagnosis not present

## 2015-09-18 DIAGNOSIS — Z87891 Personal history of nicotine dependence: Secondary | ICD-10-CM | POA: Diagnosis not present

## 2015-09-18 DIAGNOSIS — Z7952 Long term (current) use of systemic steroids: Secondary | ICD-10-CM | POA: Diagnosis not present

## 2015-09-18 DIAGNOSIS — I10 Essential (primary) hypertension: Secondary | ICD-10-CM | POA: Insufficient documentation

## 2015-09-18 DIAGNOSIS — Z79899 Other long term (current) drug therapy: Secondary | ICD-10-CM | POA: Diagnosis not present

## 2015-09-18 DIAGNOSIS — M1990 Unspecified osteoarthritis, unspecified site: Secondary | ICD-10-CM | POA: Insufficient documentation

## 2015-09-18 DIAGNOSIS — G629 Polyneuropathy, unspecified: Secondary | ICD-10-CM | POA: Diagnosis not present

## 2015-09-18 DIAGNOSIS — J439 Emphysema, unspecified: Secondary | ICD-10-CM | POA: Diagnosis not present

## 2015-09-18 DIAGNOSIS — E119 Type 2 diabetes mellitus without complications: Secondary | ICD-10-CM | POA: Insufficient documentation

## 2015-09-18 DIAGNOSIS — I509 Heart failure, unspecified: Secondary | ICD-10-CM | POA: Insufficient documentation

## 2015-09-18 DIAGNOSIS — E785 Hyperlipidemia, unspecified: Secondary | ICD-10-CM | POA: Insufficient documentation

## 2015-09-18 DIAGNOSIS — Z7902 Long term (current) use of antithrombotics/antiplatelets: Secondary | ICD-10-CM | POA: Insufficient documentation

## 2015-09-18 DIAGNOSIS — L089 Local infection of the skin and subcutaneous tissue, unspecified: Secondary | ICD-10-CM | POA: Insufficient documentation

## 2015-09-18 DIAGNOSIS — K219 Gastro-esophageal reflux disease without esophagitis: Secondary | ICD-10-CM | POA: Insufficient documentation

## 2015-09-18 DIAGNOSIS — Z86718 Personal history of other venous thrombosis and embolism: Secondary | ICD-10-CM | POA: Diagnosis not present

## 2015-09-18 DIAGNOSIS — Z7982 Long term (current) use of aspirin: Secondary | ICD-10-CM | POA: Diagnosis not present

## 2015-09-18 LAB — CBC WITH DIFFERENTIAL/PLATELET
Basophils Absolute: 0 10*3/uL (ref 0.0–0.1)
Basophils Relative: 0 %
Eosinophils Absolute: 0.2 10*3/uL (ref 0.0–0.7)
Eosinophils Relative: 2 %
HCT: 31.4 % — ABNORMAL LOW (ref 39.0–52.0)
Hemoglobin: 10.6 g/dL — ABNORMAL LOW (ref 13.0–17.0)
Lymphocytes Relative: 13 %
Lymphs Abs: 1.3 10*3/uL (ref 0.7–4.0)
MCH: 28 pg (ref 26.0–34.0)
MCHC: 33.8 g/dL (ref 30.0–36.0)
MCV: 83.1 fL (ref 78.0–100.0)
Monocytes Absolute: 0.6 10*3/uL (ref 0.1–1.0)
Monocytes Relative: 6 %
Neutro Abs: 7.8 10*3/uL — ABNORMAL HIGH (ref 1.7–7.7)
Neutrophils Relative %: 79 %
Platelets: 269 10*3/uL (ref 150–400)
RBC: 3.78 MIL/uL — ABNORMAL LOW (ref 4.22–5.81)
RDW: 13.2 % (ref 11.5–15.5)
WBC: 9.8 10*3/uL (ref 4.0–10.5)

## 2015-09-18 LAB — BASIC METABOLIC PANEL
Anion gap: 14 (ref 5–15)
BUN: 28 mg/dL — ABNORMAL HIGH (ref 6–20)
CO2: 27 mmol/L (ref 22–32)
Calcium: 9.6 mg/dL (ref 8.9–10.3)
Chloride: 95 mmol/L — ABNORMAL LOW (ref 101–111)
Creatinine, Ser: 1.43 mg/dL — ABNORMAL HIGH (ref 0.61–1.24)
GFR calc Af Amer: 60 mL/min — ABNORMAL LOW (ref >60)
GFR calc non Af Amer: 51 mL/min — ABNORMAL LOW (ref >60)
Glucose, Bld: 267 mg/dL — ABNORMAL HIGH (ref 65–99)
Potassium: 4.6 mmol/L (ref 3.5–5.1)
Sodium: 136 mmol/L (ref 135–145)

## 2015-09-18 LAB — I-STAT CG4 LACTIC ACID, ED
Lactic Acid, Venous: 1.9 mmol/L (ref 0.5–2.0)
Lactic Acid, Venous: 2.28 mmol/L (ref 0.5–2.0)

## 2015-09-18 LAB — HIV ANTIBODY (ROUTINE TESTING W REFLEX): HIV Screen 4th Generation wRfx: NONREACTIVE

## 2015-09-18 LAB — HEMOGLOBIN A1C
Hgb A1c MFr Bld: 7.5 % — ABNORMAL HIGH (ref 4.8–5.6)
Mean Plasma Glucose: 169 mg/dL

## 2015-09-18 MED ORDER — SULFAMETHOXAZOLE-TRIMETHOPRIM 800-160 MG PO TABS
1.0000 | ORAL_TABLET | Freq: Two times a day (BID) | ORAL | Status: AC
Start: 2015-09-18 — End: 2015-09-25

## 2015-09-18 MED ORDER — SULFAMETHOXAZOLE-TRIMETHOPRIM 800-160 MG PO TABS
1.0000 | ORAL_TABLET | Freq: Once | ORAL | Status: AC
  Administered 2015-09-18: 1 via ORAL
  Filled 2015-09-18: qty 1

## 2015-09-18 NOTE — ED Notes (Signed)
Pt presented with infection to L foot on 09/04/15. D/c'd with abx that he reports being compliant with. Noticed new sores and bleeding to same tonight. Per EMS, CBG was 402.

## 2015-09-18 NOTE — ED Provider Notes (Signed)
CSN: [id number]     Arrival date & time 09/18/15  0037 History  By signing my name below, I, Terrance Branch, attest that this documentation has been prepared under the direction and in the presence of Ottie Glazier, PA-C Electronically Signed: [name], ED Scribe. 09/19/2015. 1:56 AM.   Chief Complaint  Patient presents with  . Recurrent Skin Infections    The history is provided by the patient. No language interpreter was used.   Mr. Reh is a 60 y.o male with a history of diabetes, hypertension, hyperlipidemia, COPD, sleep apnea, DVT (no longer on anticoagulation), and CHF who presents for worsening left foot infection that he first noticed 2 weeks prior. Pt states that he was admitted and placed on antibiotics and the infection was improving. Pt states that over the past 2 days the infection has been getting worse. He states that the pain is worse and that he has noticed more discharge coming from the wounds. He also states that he has been taking his diabetes medication and that his blood sugar has been under control. Pt denies fever, chills.    Past Medical History  Diagnosis Date  . Carotid artery occlusion 11/10/10    LEFT CAROTID ENDARTERECTOMY  . Diabetes mellitus   . Hypertension   . Hyperlipidemia   . COPD (chronic obstructive pulmonary disease) ([place])   . DJD (degenerative joint disease)   . GERD (gastroesophageal reflux disease)   . Substernal chest pain   . 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 Slurred speech     AS PER WIFE IN D/C NOTE 11/10/10  . PAD (peripheral artery disease) ([place])     Distal aortogram June 2012. Atherectomy left popliteal artery July 2012.   . Barrett's esophagus   . Fatty liver   . Diverticulosis of colon (without mention of hemorrhage)   . Emphysema of lung ([place])   . Osteomyelitis ([place])   . Complication of anesthesia     BP WENT UP AT [facility] "  . Sleep apnea     has not used CPAP in e years - took self off  .  H/O hiatal hernia   . Neuromuscular disorder ([place])     peripheral neuropathy  . Full dentures   . Wears glasses   . CHF (congestive heart failure) ([place])   . History of echocardiogram 08/22/2011    Mildly hypertrophic left ventricle with normal systolic function and mild diastolic dysfunction. mildly dilated left atrium. no significant valvular abnormalities. no pericardial effusion no change from previous study 10/2010. the ejection fraction =>55%  . History of cardiovascular stress test 03/23/2011    ejection fraction measured 51%, with an end-diastolic volume of 123456 ml and an end-systolic volume of 81 ml. no evidence of myocardial ischemia.  . H/O Doppler ultrasound 06/28/2012    Lower venous duplex left-- findings consistent with acute deep vein thrombosis involving the personal vein of the left lower extremity. No evidence of Baker's cyst on the left. Duplex scan of the left lower extremity arteries reveal adequate blood flow.   . History of Holter monitoring 02/14/2012    Scan quality is fair due to baseline artifact. Predominant rhythm is Sinus. Veentricular Ectopy was noted in single beat form. The total recording time was 24:00 hours. total beats =[id number]. the total time analyzed was 23:58 hours. Heart  Max = 102 at 1:18:22, Min= 52 at 9:56:19 am., Avg= 70. There were 0 pause(s) > 2.5 seconds.  the longest pause = 0.00 sec. at 1:51 pm(1)  . OSA (obstructive sleep apnea) 12/26/08    AHI of 6.3/hr overall, 32.7/hr during REM sleep and an RDI of 24.8/hr and 50.9/hr.   Past Surgical History  Procedure Laterality Date  . Laminectomy      X 3 LUMBAR AND X 2 CERVICAL SPINE OPERATIONS  . Carotid endarterectomy  11/10/10  . Hernia repair      LEFT INGUINAL AND UMBILICAL REPAIRS  . Anterior fusion cervical spine  02/06/06    C4-5, C5-6, C6-7; SURGEON DR. MAX COHEN  . Penile prosthesis implant  08/14/05    INFRAPUBIC INSERTION OF INFLATABLE PENILE PROSTHESIS; SURGEON DR. Amalia Hailey  . Laparoscopic  cholecystectomy w/ cholangiography  11/09/04    SURGEON DR. Luella Cook  . Total knee arthroplasty  07/2002    RIGHT KNEE ; SURGEON  DR. [name] ALSO HAD ARTHROSCOPIC RIGHT KNEE IN  10/2001  . Shoulder arthroscopy    . Cholecystectomy    . Amputation  11/05/2011    Procedure: AMPUTATION RAY;  Surgeon: Wylene Simmer, MD;  Location: [place];  Service: Orthopedics;  Laterality: Right;  Amputation of Right 4&5th Toes  . Femoral artery stent      x6  . Amputation Left 11/26/2012    Procedure: AMPUTATION RAY;  Surgeon: Wylene Simmer, MD;  Location: [place];  Service: Orthopedics;  Laterality: Left;  fourth ray amputation  . Cardiac catheterization  10/31/04    2009  . Carpal tunnel release Right 10/21/2013    Procedure: RIGHT CARPAL TUNNEL RELEASE;  Surgeon: Wynonia Sours, MD;  Location: [place];  Service: Orthopedics;  Laterality: Right;  . Ulnar nerve transposition Right 10/21/2013    Procedure: RIGHT ELBOW  ULNAR NERVE DECOMPRESSION;  Surgeon: Wynonia Sours, MD;  Location: [place];  Service: Orthopedics;  Laterality: Right;  . Joint replacement Right 2001    Total  . Spine surgery    . Neck surgery    . Esophageal manometry Bilateral 07/19/2014    Procedure: ESOPHAGEAL MANOMETRY (EM);  Surgeon: Jerene Bears, MD;  Location: [facility];  Service: Gastroenterology;  Laterality: Bilateral;  . Lower extremity angiogram N/A 03/19/2012    Procedure: LOWER EXTREMITY ANGIOGRAM;  Surgeon: Burnell Blanks, MD;  Location: [facility] CATH LAB;  Service: Cardiovascular;  Laterality: N/A;  . Amputation Right 08/27/2014    Procedure: Transmetatarsal Amputation;  Surgeon: Newt Minion, MD;  Location: [facility] [place];  Service: Orthopedics;  Laterality: Right;  . Left heart catheterization with coronary angiogram N/A 10/29/2014    Procedure: LEFT HEART CATHETERIZATION WITH CORONARY ANGIOGRAM;  Surgeon: Laverda Page, MD;  Location: [facility] C[facility] LAB;  Service: Cardiovascular;  Laterality: N/A;  .  Percutaneous coronary stent intervention (pci-s) Right 10/29/2014    Procedure: PERCUTANEOUS CORONARY STENT INTERVENTION (PCI-S);  Surgeon: Laverda Page, MD;  Location: [facility] CATH LAB;  Service: Cardiovascular;  Laterality: Right;  . Amputation Right 01/14/2015    Procedure: AMPUTATION BELOW KNEE;  Surgeon: Newt Minion, MD;  Location: [place];  Service: Orthopedics;  Laterality: Right;  . Carotid endarterectomy Left 11/10/2010    Subtotal occlusion of left internal carotid artery with left hemispheric transient ischemic attacks.   Family History  Problem Relation Age of Onset  . Heart disease Father     Before age 76-  CAD, BPG  . Diabetes Father     Amputation  . Cancer Father     PROSTATE  . Hyperlipidemia Father   .  Hypertension Father   . Heart attack Father     Triple BPG  . Varicose Veins Father   . Arthritis      GRANDMOTHER  . Hypertension      OTHER FAMILY MEMBERS  . Colon cancer Brother   . Cancer Brother     Colon  . Diabetes Brother   . Heart disease Brother 33    A-Fib. Before age 44  . Hyperlipidemia Brother   . Hypertension Brother   . Cancer Sister     Breast  . Hyperlipidemia Sister   . Hypertension Sister   . Hypertension Son    Social History  Substance Use Topics  . Smoking status: Former Smoker -- 0.50 packs/day for 35 years    Types: E-cigarettes    Quit date: 10/28/2011  . Smokeless tobacco: Never Used     Comment: VAPOR CIGARETTES  . Alcohol Use: No     Comment: ONCE IN A WHILE    Review of Systems  Constitutional: Negative for fever and chills.  Skin: Positive for wound.      Allergies  Ivp dye and Adhesive  Home Medications   Prior to Admission medications   Medication Sig Start Date End Date Taking? Authorizing Provider  ALPRAZolam (XANAX) 0.25 MG tablet Take 0.25 mg by mouth daily. 08/22/15   Historical Provider, MD  aspirin EC 81 MG tablet Take 81 mg by mouth daily.    Historical Provider, MD  atorvastatin (LIPITOR) 40 MG  tablet Take 40 mg by mouth daily.    Historical Provider, MD  carvedilol (COREG) 25 MG tablet Take 25 mg by mouth daily. 08/18/15   Historical Provider, MD  clobetasol (TEMOVATE) 0.05 % external solution Apply 1 application topically daily as needed (dry scalp).    Historical Provider, MD  clopidogrel (PLAVIX) 75 MG tablet Take 75 mg by mouth daily.  03/28/12   Historical Provider, MD  clotrimazole-betamethasone (LOTRISONE) cream Apply 1 application topically daily as needed (dry skin).    Historical Provider, MD  collagenase (SANTYL) ointment Apply topically daily. 09/06/15   Lavina Hamman, MD  eplerenone (INSPRA) 50 MG tablet Take 50 mg by mouth daily. 08/18/15   Historical Provider, MD  feeding supplement, GLUCERNA SHAKE, (GLUCERNA SHAKE) LIQD Take 237 mLs by mouth 2 (two) times daily between meals. 09/06/15   Lavina Hamman, MD  fentaNYL (DURAGESIC - DOSED MCG/HR) 50 MCG/HR Place 1 patch (50 mcg total) onto the skin every 3 (three) days. Patient taking differently: Place 50 mcg onto the skin every 3 (three) days. Change patch every three days 01/20/15   Florencia Reasons, MD  furosemide (LASIX) 80 MG tablet Take 0.5 tablets (40 mg total) by mouth daily. 09/06/15   Lavina Hamman, MD  glyBURIDE-metformin (GLUCOVANCE) 2.5-500 MG per tablet Take 2 tablets by mouth 2 (two) times daily with a meal.  03/05/12   Historical Provider, MD  HYDROmorphone (DILAUDID) 2 MG tablet Take 1 tablet (2 mg total) by mouth every 4 (four) hours as needed. Patient taking differently: Take 2 mg by mouth at bedtime as needed for moderate pain.  01/17/15   Shanker Kristeen Mans, MD  insulin glargine (LANTUS) 100 unit/mL SOPN Inject 40 Units into the skin at bedtime.    Historical Provider, MD  Insulin Glulisine (APIDRA) 100 UNIT/ML Solostar Pen Inject 10 Units into the skin 3 (three) times daily with meals.    Historical Provider, MD  isosorbide mononitrate (IMDUR) 120 MG 24 hr tablet Take 120 mg  by mouth daily.    Historical Provider, MD   lactobacillus acidophilus (BACID) TABS tablet Take 2 tablets by mouth 3 (three) times daily. 09/06/15   Lavina Hamman, MD  Linaclotide (LINZESS) 145 MCG CAPS capsule Take 1 capsule (145 mcg total) by mouth daily as needed. Patient taking differently: Take 145 mcg by mouth daily as needed (constipation).  01/20/15   Florencia Reasons, MD  meloxicam (MOBIC) 15 MG tablet Take 15 mg by mouth daily. 07/12/15   Historical Provider, MD  Multiple Vitamin (MULTIVITAMIN WITH MINERALS) TABS tablet Take 1 tablet by mouth daily.    Historical Provider, MD  naproxen sodium (ALEVE) 220 MG tablet Take 220 mg by mouth 2 (two) times daily with a meal.    Historical Provider, MD  nitroGLYCERIN (NITRO-BID) 2 % ointment Apply topically daily as needed (esophogeal spasms).    Historical Provider, MD  nitroGLYCERIN (NITROSTAT) 0.4 MG SL tablet Place 1 tablet (0.4 mg total) under the tongue every 5 (five) minutes as needed (as needed for esophageal spasm). Patient taking differently: Place 0.4 mg under the tongue every 5 (five) minutes as needed for chest pain.  08/09/14   Jerene Bears, MD  olmesartan-hydrochlorothiazide (BENICAR HCT) 40-25 MG tablet Take 1 tablet by mouth at bedtime. 08/01/15   Historical Provider, MD  omega-3 acid ethyl esters (LOVAZA) 1 G capsule Take 2 g by mouth 2 (two) times daily.    Historical Provider, MD  ondansetron (ZOFRAN-ODT) 4 MG disintegrating tablet Take 4 mg by mouth 4 (four) times daily as needed for nausea.    Historical Provider, MD  oxyCODONE-acetaminophen (PERCOCET/ROXICET) 5-325 MG per tablet Take 1 tablet by mouth every 8 (eight) hours as needed. Patient taking differently: Take 1 tablet by mouth every 8 (eight) hours. scheduled 09/30/14   Thurnell Lose, MD  pantoprazole (PROTONIX) 40 MG tablet Take 1 tablet (40 mg total) by mouth daily before breakfast. 10/30/14   Adrian Prows, MD  Polyethyl Glycol-Propyl Glycol (SYSTANE ULTRA OP) Place 1 drop into both eyes as needed (for dry eyes).     Historical Provider, MD  pregabalin (LYRICA) 100 MG capsule Take 100 mg by mouth 3 (three) times daily.    Historical Provider, MD  rOPINIRole (REQUIP) 2 MG tablet Take 2 mg by mouth at bedtime.     Historical Provider, MD  sulfamethoxazole-trimethoprim (BACTRIM DS,SEPTRA DS) 800-160 MG tablet Take 1 tablet by mouth 2 (two) times daily. 09/18/15 09/25/15  Kiana Hollar Patel-Mills, PA-C  Tamsulosin HCl (FLOMAX) 0.4 MG CAPS Take 0.4 mg by mouth daily.    Historical Provider, MD  traMADol (ULTRAM) 50 MG tablet Take 50 mg by mouth every 6 (six) hours as needed. 06/13/15   Historical Provider, MD   BP 110/63 mmHg  Pulse 78  Temp(Src) 98.7 F (37.1 C) (Oral)  Resp 17  Ht 6\' 2"  (1.88 m)  Wt 113.399 kg  BMI 32.08 kg/m2  SpO2 90%   Physical Exam  Constitutional: He is oriented to person, place, and time. He appears well-developed and well-nourished. No distress.  HENT:  Head: Normocephalic and atraumatic.  Eyes: Conjunctivae and EOM are normal.  Neck: Neck supple. No tracheal deviation present.  Cardiovascular: Normal rate, regular rhythm and normal heart sounds.   Pulmonary/Chest: Effort normal and breath sounds normal. No respiratory distress.  Musculoskeletal: Normal range of motion.  Neurological: He is alert and oriented to person, place, and time.  Skin: Skin is warm and dry.  Left foot: 4th toe amputation, plantar ulceration with  drainage and minimal surrounding erythema, no bone can be visualized,  Right leg: BKA  Psychiatric: He has a normal mood and affect. His behavior is normal.  Nursing note and vitals reviewed.   ED Course  Procedures (including critical care time) DIAGNOSTIC STUDIES: Oxygen Saturation is 100% on RA, normal by my interpretation.    COORDINATION OF CARE: 12:55 AM-Discussed treatment plan with pt at bedside and pt agreed to plan.     Labs Review Labs Reviewed  CBC WITH DIFFERENTIAL/PLATELET - Abnormal; Notable for the following:    RBC 3.78 (*)    Hemoglobin  10.6 (*)    HCT 31.4 (*)    Neutro Abs 7.8 (*)    All other components within normal limits  BASIC METABOLIC PANEL - Abnormal; Notable for the following:    Chloride 95 (*)    Glucose, Bld 267 (*)    BUN 28 (*)    Creatinine, Ser 1.43 (*)    GFR calc non Af Amer 51 (*)    GFR calc Af Amer 60 (*)    All other components within normal limits  I-STAT CG4 LACTIC ACID, ED - Abnormal; Notable for the following:    Lactic Acid, Venous 2.28 (*)    All other components within normal limits  CULTURE, BLOOD (ROUTINE X 2)  CULTURE, BLOOD (ROUTINE X 2)  I-STAT CG4 LACTIC ACID, ED    Imaging Review Dg Foot Complete Left  09/18/2015  CLINICAL DATA:  Multiple open sores on the left foot, with a new ulcer plantar to the left second metatarsophalangeal joint. Initial encounter. EXAM: LEFT FOOT - COMPLETE 3+ VIEW COMPARISON:  Left foot radiographs and MRI performed 09/03/2015 FINDINGS: Known plantar ulcerations are not well characterized on radiograph. There is no evidence of osseous erosion to suggest osteomyelitis. Visualized joint spaces are grossly preserved. The patient is status post amputation of the fourth toe. A small plantar calcaneal spur is noted. IMPRESSION: No evidence of osseous erosion to suggest osteomyelitis. Evaluation for osteomyelitis is limited on radiograph. Electronically Signed   By: Garald Balding M.D.   On: 09/18/2015 02:00      EKG Interpretation None      MDM   Final diagnoses:  Left foot infection   Patient presents for left foot infection.  He is a diabetic with poorly controlled blood sugars. He has peripheral neuropathy and cannot feel his left foot. He has a previous right BKA. Patient currently on Keflex TID for foot infection.  He is afebrile with normal WBC.  Xray of the foot is negative for osteomyelitis. I discussed with the patient that he should follow-up with the [place]. He will need dressing changes. He was also placed on Bactrim. Return precautions were  discussed with the patient and he agrees with plan.  I discussed this patient with Dr. Claudine Mouton who has seen and evaluated the patient and agrees with plan..   I personally performed the services described in this documentation, which was scribed in my presence. The recorded information has been reviewed and is accurate. Filed Vitals:   09/18/15 0300 09/18/15 0315  BP: 114/59 110/63  Pulse: 71 78  Temp:    Resp: 17    Medications  sulfamethoxazole-trimethoprim (BACTRIM DS,SEPTRA DS) 800-160 MG per tablet 1 tablet (1 tablet Oral Given 09/18/15 0317)      Ottie Glazier, PA-C 09/19/15 [place], MD 09/19/15 [id number]

## 2015-09-18 NOTE — ED Notes (Signed)
Patient transported to X-ray 

## 2015-09-18 NOTE — ED Notes (Signed)
Reviewed d/c instructions, then re-wrapped wound with non-adherent dressing, kling & ace wrap. Pt departed to waiting room to wait for his brother.

## 2015-09-23 LAB — CULTURE, BLOOD (ROUTINE X 2)
Culture: NO GROWTH
Culture: NO GROWTH

## 2015-09-26 ENCOUNTER — Ambulatory Visit: Payer: BLUE CROSS/BLUE SHIELD | Admitting: Infectious Diseases

## 2015-10-12 ENCOUNTER — Ambulatory Visit (INDEPENDENT_AMBULATORY_CARE_PROVIDER_SITE_OTHER): Payer: BLUE CROSS/BLUE SHIELD | Admitting: Infectious Diseases

## 2015-10-12 ENCOUNTER — Encounter: Payer: Self-pay | Admitting: Infectious Diseases

## 2015-10-12 VITALS — BP 130/75 | HR 70 | Temp 99.0°F

## 2015-10-12 DIAGNOSIS — L97529 Non-pressure chronic ulcer of other part of left foot with unspecified severity: Secondary | ICD-10-CM | POA: Diagnosis not present

## 2015-10-12 DIAGNOSIS — E11621 Type 2 diabetes mellitus with foot ulcer: Secondary | ICD-10-CM | POA: Diagnosis not present

## 2015-10-12 MED ORDER — DOXYCYCLINE HYCLATE 100 MG PO TABS: 100.0000 mg | ORAL_TABLET | Freq: Two times a day (BID) | ORAL | Status: DC

## 2015-10-12 NOTE — Assessment & Plan Note (Signed)
Will start him on doxy. Hesitant to use bactrim given his abn Cr.  He is going to get new diabetic shoe.  He needs good wound care.  Will see him back in 1 month.

## 2015-10-12 NOTE — Progress Notes (Signed)
   Subjective:    Patient ID: [name], male    DOB: [birth year], 60 y.o.   MRN: [id number]  HPI 61 yo M with hx PAD status post bilateral lower extremity stenting 2 years ago at [facility], left carotid endarterectomy in 2012, type 2 diabetes mellitus non-insulin-dependent, hypertension, dyslipidemia, COPD, GERD/Barrett's esophagus, who underwent right fourth and fifth toe amputation a few years ago by [name]. In [facility] 2015, he had exposed bone with a small ulcer in the right foot and underwent transmetatarsal resection by [name], he went home and was doing well. He had sutures removed in early January then 09-25-14 he developed pain and drainage at the postop site. In the ER he was diagnosed with dehiscence of his postop wound with some puslike drainage. He was admitted on 09-26-14. He was evaluated by orthopedics and was treated with vanco/cefepime/flagyl. He did not undergo surgery. He was discussed with ID over the phone and was recommended to go home on po keflex for 2 weeks. He had a wound Cx which grew MSSA. He had plain films which did not show osteo. He was d/c home on 09-30-14. Finished keflex end of March 2016.   He had cardiac cath 2-19 and had 3 drug eluting stents in his RCA.  By May 2016 he underwent R BKA.  He then developed 2 new LLE ulcers and was hospitalized 09-02-15. He had MRI which shoed:  1. Interval amputation of the fourth toe. No evidence of recurrent osteomyelitis. 2. Dorsal subcutaneous edema consistent with cellulitis. No evidence of focal abscess. 3. 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  He was eval by ortho, was given keflex for 2 weeks and d/x to home on 09-06-15.  He returned to ED on 1-8 with worsening sores on his feet, bleeding. He had plain films which did not show osteo. His anbx were not refilled.  One of his ulcers has since healed.  He has home nursing.   Has been unable to wear his compression  socks.   Review of Systems  Constitutional: Negative for fever and appetite change.  Gastrointestinal: Negative for diarrhea and constipation.  Genitourinary: Negative for difficulty urinating.       Objective:   Physical Exam  Constitutional: He appears well-developed and well-nourished.  Musculoskeletal: He exhibits edema.       Feet:       Assessment & Plan:

## 2015-10-20 ENCOUNTER — Encounter (HOSPITAL_COMMUNITY): Payer: Self-pay | Admitting: *Deleted

## 2015-10-20 ENCOUNTER — Other Ambulatory Visit (HOSPITAL_COMMUNITY): Payer: Self-pay | Admitting: Orthopedic Surgery

## 2015-10-20 NOTE — Progress Notes (Signed)
According to Dr. Tobias Alexander, Anesthesia, okay for pt to hold HS dose of glyBURIDE-metformin (GLUCOVANCE) tonight and morning of surgery. Pt made aware to take 32 units of Lantus insulin tonight instead of 40 units. Will contact MD on DOS to see if pt can take Plavix on DOS and F/U with pt. Pt made aware of diabetes protocol, checking BS, interventions for BS <70 and >220. Pt verbalized understanding of all pre-op instructions.

## 2015-10-20 NOTE — Progress Notes (Signed)
Anesthesia Chart Review: SAME DAY WORK-UP (late add-on from 10/20/15). I was asked to review chart after 4:15 PM today.  Patient is a 60 year old male scheduled for left foot 5th ray amputation on 10/21/15 by [name].   History includes smoking, HTN, DM2, COPD, HLD, GERD, fatty liver, hiatal hernia, OSA, CAD s/p DES to RCA 10/29/14, s/p left CEA, PAD s/p right TMA 08/2014 and right BKA 01/14/15, left foot ray amputation '14. Had HTN with anesthesia in the past. PCP is listed as Dr. Anda Kraft. Cardiologist is [name]. He cleared him for his BKA in 01/2015 as [name] was continuing ASA and Plavix. Reportedly, these are being continued for this procedure as well. Latest cardiology records are still pending, but Dr. Irven Shelling staff report that patient was last seen in 07/2015 with last EKG there 03/2015. Staff said they would fax records, but still not received by 5:30 PM.   Meds include Xanax, ASA, Lipitor, Coreg, Plavix, doxycycline, eplerenone, Duragesic, Lasix, Glucovance, Dilaudid, Lantus, Apidra, Imdur, Linzess, Protonix, Lyrica, Nitro, Benicar-HCT, Lovaza, Requip, Flomax, Lyrica, Protonix.  10/29/14 Cardiac cath: Hemodynamic data: Left ventricular pressure was 135/6 with LVEDP of 14 mm mercury. Aortic pressure was 135/67 with a mean of 93 mm mercury. There was no pressure gradient across the aortic valve.  Right coronary artery: Co-Dominant. it is diffusely diseased all the way from the proximal segment to the distal segment with tandem 70-80% stenosis in 2 areas distally and at least one area in the proximal segment, distally gives origin to large PDA and PL branches. PDA and PL branches are relatively disease-free.  Left main coronary artery is large and normal. Circumflex coronary artery: A large vessel, codominant with RCA, gives origin to distal small PDA branches. There is mild diffuse disease. It gives origin to 3 small OM's.  LAD: LAD gives origin to a large diagonal-1. LAD milduminal  irregularities. the diagonal branch has mild 20-30% proximal stenosis. Ramus intermediate: Is a very large caliber vessel, has a highly bifurcation and continues as a very large vessel. This secondary branch is also fairly large. There is mild proximal luminal irregularity. No high-grade stenosis.  Impression: Significant single-vessel coronary artery disease involving diffusely diseased RCA. This correlates with the severe ischemia evident by stress testing. We'll proceed with intervention.  Interventional data: Successful PTCA and stenting of the distal, mid and proximal RCA with implantation of 3 overlapping drug-eluting stent, from distal to proximal 2.5 x 38 mm, 2.75 x 38 mm and a 2.75 x 20 mm Promus premier DES. Will need Dual antiplatelet therapy with Plavix81 mg for at least1 year, probably much longer due to his multiple cardiac risk factors including severe peripheral arterial disease. ([name])   10/30/14 EKG: NSR, LAD, RBBB, minimal voltage criteria for LVH, septal infarct (age undetermined), T wave abnormality, consider lateral ischemia.  09/27/14 Echo: - Left ventricle: The cavity size was normal. There was moderate concentric hypertrophy. Systolic function was normal. The estimated ejection fraction was in the range of 55% to 60%. Wall motion was normal; there were no regional wall motionabnormalities. Features are consistent with a pseudonormal leftventricular filling pattern, with concomitant abnormal relaxation and increased filling pressure (grade 2 diastolic dysfunction).Doppler parameters are consistent with both elevated ventricularend-diastolic filling pressure and elevated left atrial fillingpressure. - Mitral valve: Calcified annulus. - Left atrium: The atrium was mildly dilated.  02/09/14 Carotid duplex: < 40% stenosis RICA, patent LCEA with minimal restenosis. Bilateral vertebral artery is antegrade.  01/14/15 CXR: IMPRESSION: Mild  cardiomegaly. Similar bronchitic  changes with superimposed patchy RIGHT middle lobe airspace opacity which may reflect atelectasis or possibly pneumonia.  He is for labs on arrival.   Patient had PCI less than one year ago. He is on DAPT. Reportedly saw [name] in 07/2015, although records are pending. Further evaluation by his anesthesiologist on the day of surgery to ensure no acute changes prior to proceeding. HE IS ON PLAVIX AND ASA.  George Hugh [facility] Short Stay Center/Anesthesiology Phone [phone] 10/20/2015 5:49 PM

## 2015-10-20 NOTE — Progress Notes (Signed)
Pt denies SOB and chest pain but is under the care of [name], cardiology. Spoke with Ebony Hail, [place],  Anesthesia regarding pt history ( see note) .Pt made aware to stop taking otc vitamins, fish oil, NSAID's ( Mobic, Aleve) and Herbal medications such as fish oil ( Lovaza).  Pt verbalized understanding of al pre-op instructions.

## 2015-10-21 ENCOUNTER — Ambulatory Visit (HOSPITAL_COMMUNITY): Payer: BLUE CROSS/BLUE SHIELD | Admitting: Vascular Surgery

## 2015-10-21 ENCOUNTER — Encounter (HOSPITAL_COMMUNITY)
Admission: RE | Disposition: A | Discharge status: Home / Self Care | Payer: Self-pay | Source: Ambulatory Visit | Attending: Orthopedic Surgery

## 2015-10-21 ENCOUNTER — Ambulatory Visit (HOSPITAL_COMMUNITY)
Admission: RE | Admit: 2015-10-21 | Discharge: 2015-10-22 | Disposition: A | Discharge status: Home / Self Care | Payer: BLUE CROSS/BLUE SHIELD | Source: Ambulatory Visit | Attending: Orthopedic Surgery | Admitting: Orthopedic Surgery

## 2015-10-21 ENCOUNTER — Encounter (HOSPITAL_COMMUNITY): Payer: Self-pay | Admitting: *Deleted

## 2015-10-21 DIAGNOSIS — E1142 Type 2 diabetes mellitus with diabetic polyneuropathy: Secondary | ICD-10-CM | POA: Insufficient documentation

## 2015-10-21 DIAGNOSIS — I110 Hypertensive heart disease with heart failure: Secondary | ICD-10-CM | POA: Insufficient documentation

## 2015-10-21 DIAGNOSIS — Z794 Long term (current) use of insulin: Secondary | ICD-10-CM | POA: Insufficient documentation

## 2015-10-21 DIAGNOSIS — Z981 Arthrodesis status: Secondary | ICD-10-CM | POA: Insufficient documentation

## 2015-10-21 DIAGNOSIS — E1169 Type 2 diabetes mellitus with other specified complication: Secondary | ICD-10-CM | POA: Diagnosis not present

## 2015-10-21 DIAGNOSIS — Z7982 Long term (current) use of aspirin: Secondary | ICD-10-CM | POA: Insufficient documentation

## 2015-10-21 DIAGNOSIS — E11621 Type 2 diabetes mellitus with foot ulcer: Secondary | ICD-10-CM | POA: Insufficient documentation

## 2015-10-21 DIAGNOSIS — Z87891 Personal history of nicotine dependence: Secondary | ICD-10-CM | POA: Insufficient documentation

## 2015-10-21 DIAGNOSIS — G4733 Obstructive sleep apnea (adult) (pediatric): Secondary | ICD-10-CM | POA: Insufficient documentation

## 2015-10-21 DIAGNOSIS — M1990 Unspecified osteoarthritis, unspecified site: Secondary | ICD-10-CM | POA: Insufficient documentation

## 2015-10-21 DIAGNOSIS — Z7902 Long term (current) use of antithrombotics/antiplatelets: Secondary | ICD-10-CM | POA: Insufficient documentation

## 2015-10-21 DIAGNOSIS — L97529 Non-pressure chronic ulcer of other part of left foot with unspecified severity: Secondary | ICD-10-CM | POA: Diagnosis not present

## 2015-10-21 DIAGNOSIS — Z955 Presence of coronary angioplasty implant and graft: Secondary | ICD-10-CM | POA: Diagnosis not present

## 2015-10-21 DIAGNOSIS — Z791 Long term (current) use of non-steroidal anti-inflammatories (NSAID): Secondary | ICD-10-CM | POA: Diagnosis not present

## 2015-10-21 DIAGNOSIS — K219 Gastro-esophageal reflux disease without esophagitis: Secondary | ICD-10-CM | POA: Diagnosis not present

## 2015-10-21 DIAGNOSIS — Z96651 Presence of right artificial knee joint: Secondary | ICD-10-CM | POA: Diagnosis not present

## 2015-10-21 DIAGNOSIS — I2510 Atherosclerotic heart disease of native coronary artery without angina pectoris: Secondary | ICD-10-CM | POA: Insufficient documentation

## 2015-10-21 DIAGNOSIS — Z89422 Acquired absence of other left toe(s): Secondary | ICD-10-CM | POA: Insufficient documentation

## 2015-10-21 DIAGNOSIS — J449 Chronic obstructive pulmonary disease, unspecified: Secondary | ICD-10-CM | POA: Diagnosis not present

## 2015-10-21 DIAGNOSIS — S98139A Complete traumatic amputation of one unspecified lesser toe, initial encounter: Secondary | ICD-10-CM

## 2015-10-21 DIAGNOSIS — E1151 Type 2 diabetes mellitus with diabetic peripheral angiopathy without gangrene: Secondary | ICD-10-CM | POA: Insufficient documentation

## 2015-10-21 DIAGNOSIS — E785 Hyperlipidemia, unspecified: Secondary | ICD-10-CM | POA: Diagnosis not present

## 2015-10-21 DIAGNOSIS — Z89511 Acquired absence of right leg below knee: Secondary | ICD-10-CM | POA: Insufficient documentation

## 2015-10-21 DIAGNOSIS — M869 Osteomyelitis, unspecified: Secondary | ICD-10-CM | POA: Diagnosis present

## 2015-10-21 DIAGNOSIS — I509 Heart failure, unspecified: Secondary | ICD-10-CM | POA: Diagnosis not present

## 2015-10-21 HISTORY — PX: AMPUTATION: SHX166

## 2015-10-21 LAB — GLUCOSE, CAPILLARY
Glucose-Capillary: 237 mg/dL — ABNORMAL HIGH (ref 65–99)
Glucose-Capillary: 160 mg/dL — ABNORMAL HIGH (ref 65–99)
Glucose-Capillary: 128 mg/dL — ABNORMAL HIGH (ref 65–99)
Glucose-Capillary: 231 mg/dL — ABNORMAL HIGH (ref 65–99)

## 2015-10-21 LAB — CBC
HCT: 28.1 % — ABNORMAL LOW (ref 39.0–52.0)
Hemoglobin: 9.5 g/dL — ABNORMAL LOW (ref 13.0–17.0)
MCH: 27.8 pg (ref 26.0–34.0)
MCHC: 33.8 g/dL (ref 30.0–36.0)
MCV: 82.2 fL (ref 78.0–100.0)
Platelets: 259 10*3/uL (ref 150–400)
RBC: 3.42 MIL/uL — ABNORMAL LOW (ref 4.22–5.81)
RDW: 13.5 % (ref 11.5–15.5)
WBC: 8.3 10*3/uL (ref 4.0–10.5)

## 2015-10-21 LAB — COMPREHENSIVE METABOLIC PANEL
ALT: 11 U/L — ABNORMAL LOW (ref 17–63)
AST: 17 U/L (ref 15–41)
Albumin: 3.3 g/dL — ABNORMAL LOW (ref 3.5–5.0)
Alkaline Phosphatase: 101 U/L (ref 38–126)
Anion gap: 14 (ref 5–15)
BUN: 34 mg/dL — ABNORMAL HIGH (ref 6–20)
CO2: 23 mmol/L (ref 22–32)
Calcium: 9 mg/dL (ref 8.9–10.3)
Chloride: 103 mmol/L (ref 101–111)
Creatinine, Ser: 1.75 mg/dL — ABNORMAL HIGH (ref 0.61–1.24)
GFR calc Af Amer: 47 mL/min — ABNORMAL LOW (ref >60)
GFR calc non Af Amer: 40 mL/min — ABNORMAL LOW (ref >60)
Glucose, Bld: 228 mg/dL — ABNORMAL HIGH (ref 65–99)
Potassium: 4.1 mmol/L (ref 3.5–5.1)
Sodium: 140 mmol/L (ref 135–145)
Total Bilirubin: 0.7 mg/dL (ref 0.3–1.2)
Total Protein: 6.3 g/dL — ABNORMAL LOW (ref 6.5–8.1)

## 2015-10-21 LAB — APTT: aPTT: 29 seconds (ref 24–37)

## 2015-10-21 LAB — PROTIME-INR
INR: 1.16 (ref 0.00–1.49)
Prothrombin Time: 15 seconds (ref 11.6–15.2)

## 2015-10-21 SURGERY — AMPUTATION, FOOT, RAY
Anesthesia: General | Site: Foot | Laterality: Left

## 2015-10-21 MED ORDER — ACETAMINOPHEN 650 MG RE SUPP: 650.0000 mg | Freq: Four times a day (QID) | RECTAL | Status: DC | PRN

## 2015-10-21 MED ORDER — METHOCARBAMOL 500 MG PO TABS: 500.0000 mg | ORAL_TABLET | Freq: Four times a day (QID) | ORAL | Status: DC | PRN

## 2015-10-21 MED ORDER — FUROSEMIDE 40 MG PO TABS
40.0000 mg | ORAL_TABLET | Freq: Every day | ORAL | Status: DC
  Administered 2015-10-22: 40 mg via ORAL
  Filled 2015-10-21: qty 1

## 2015-10-21 MED ORDER — ONDANSETRON HCL 4 MG PO TABS: 4.0000 mg | ORAL_TABLET | Freq: Four times a day (QID) | ORAL | Status: DC | PRN

## 2015-10-21 MED ORDER — CHLORHEXIDINE GLUCONATE 4 % EX LIQD: 60.0000 mL | Freq: Once | CUTANEOUS | Status: DC

## 2015-10-21 MED ORDER — METOCLOPRAMIDE HCL 5 MG PO TABS: 5.0000 mg | ORAL_TABLET | Freq: Three times a day (TID) | ORAL | Status: DC | PRN

## 2015-10-21 MED ORDER — SODIUM CHLORIDE 0.9 % IV SOLN: INTRAVENOUS | Status: DC

## 2015-10-21 MED ORDER — INSULIN ASPART 100 UNIT/ML ~~LOC~~ SOLN
0.0000 [IU] | Freq: Three times a day (TID) | SUBCUTANEOUS | Status: DC
  Administered 2015-10-22: 2 [IU] via SUBCUTANEOUS

## 2015-10-21 MED ORDER — MIDAZOLAM HCL 5 MG/5ML IJ SOLN
INTRAMUSCULAR | Status: DC | PRN
  Administered 2015-10-21: 2 mg via INTRAVENOUS

## 2015-10-21 MED ORDER — EPHEDRINE SULFATE 50 MG/ML IJ SOLN
INTRAMUSCULAR | Status: AC
  Filled 2015-10-21: qty 1

## 2015-10-21 MED ORDER — LACTATED RINGERS IV SOLN: INTRAVENOUS | Status: DC

## 2015-10-21 MED ORDER — INSULIN GLARGINE 100 UNIT/ML ~~LOC~~ SOLN
40.0000 [IU] | Freq: Every day | SUBCUTANEOUS | Status: DC
  Administered 2015-10-21: 40 [IU] via SUBCUTANEOUS
  Filled 2015-10-21 (×2): qty 0.4

## 2015-10-21 MED ORDER — INSULIN ASPART 100 UNIT/ML ~~LOC~~ SOLN
4.0000 [IU] | Freq: Three times a day (TID) | SUBCUTANEOUS | Status: DC
  Administered 2015-10-22: 4 [IU] via SUBCUTANEOUS

## 2015-10-21 MED ORDER — IRBESARTAN 300 MG PO TABS
300.0000 mg | ORAL_TABLET | Freq: Every day | ORAL | Status: DC
  Administered 2015-10-21: 300 mg via ORAL
  Filled 2015-10-21: qty 1

## 2015-10-21 MED ORDER — FENTANYL 25 MCG/HR TD PT72
50.0000 ug | MEDICATED_PATCH | TRANSDERMAL | Status: DC
  Administered 2015-10-22: 50 ug via TRANSDERMAL
  Filled 2015-10-21: qty 2

## 2015-10-21 MED ORDER — ACETAMINOPHEN 325 MG PO TABS: 650.0000 mg | ORAL_TABLET | Freq: Four times a day (QID) | ORAL | Status: DC | PRN

## 2015-10-21 MED ORDER — PROPOFOL 10 MG/ML IV BOLUS
INTRAVENOUS | Status: DC | PRN
  Administered 2015-10-21: 200 mg via INTRAVENOUS

## 2015-10-21 MED ORDER — CLOPIDOGREL BISULFATE 75 MG PO TABS
75.0000 mg | ORAL_TABLET | Freq: Every day | ORAL | Status: DC
  Administered 2015-10-22: 75 mg via ORAL
  Filled 2015-10-21: qty 1

## 2015-10-21 MED ORDER — LIDOCAINE HCL (CARDIAC) 20 MG/ML IV SOLN
INTRAVENOUS | Status: DC | PRN
  Administered 2015-10-21: 60 mg via INTRAVENOUS

## 2015-10-21 MED ORDER — PREGABALIN 100 MG PO CAPS
100.0000 mg | ORAL_CAPSULE | Freq: Three times a day (TID) | ORAL | Status: DC
  Administered 2015-10-21 – 2015-10-22 (×2): 100 mg via ORAL
  Filled 2015-10-21 (×2): qty 1

## 2015-10-21 MED ORDER — HYDROMORPHONE HCL 2 MG PO TABS
2.0000 mg | ORAL_TABLET | ORAL | Status: DC | PRN
  Administered 2015-10-21: 2 mg via ORAL
  Filled 2015-10-21: qty 1

## 2015-10-21 MED ORDER — METOCLOPRAMIDE HCL 5 MG/ML IJ SOLN: 5.0000 mg | Freq: Three times a day (TID) | INTRAMUSCULAR | Status: DC | PRN

## 2015-10-21 MED ORDER — METHOCARBAMOL 1000 MG/10ML IJ SOLN: 500.0000 mg | Freq: Four times a day (QID) | INTRAVENOUS | Status: DC | PRN

## 2015-10-21 MED ORDER — CEFAZOLIN SODIUM-DEXTROSE 2-3 GM-% IV SOLR
2.0000 g | INTRAVENOUS | Status: AC
  Administered 2015-10-21: 2 g via INTRAVENOUS
  Filled 2015-10-21: qty 50

## 2015-10-21 MED ORDER — TAMSULOSIN HCL 0.4 MG PO CAPS
0.4000 mg | ORAL_CAPSULE | Freq: Every day | ORAL | Status: DC
  Administered 2015-10-22: 0.4 mg via ORAL
  Filled 2015-10-21: qty 1

## 2015-10-21 MED ORDER — INSULIN ASPART 100 UNIT/ML ~~LOC~~ SOLN: 0.0000 [IU] | SUBCUTANEOUS | Status: DC

## 2015-10-21 MED ORDER — ALPRAZOLAM 0.25 MG PO TABS
0.2500 mg | ORAL_TABLET | Freq: Every day | ORAL | Status: DC
  Administered 2015-10-22: 0.25 mg via ORAL
  Filled 2015-10-21: qty 1

## 2015-10-21 MED ORDER — ISOSORBIDE MONONITRATE ER 60 MG PO TB24
120.0000 mg | ORAL_TABLET | Freq: Every day | ORAL | Status: DC
  Administered 2015-10-22: 120 mg via ORAL
  Filled 2015-10-21: qty 2

## 2015-10-21 MED ORDER — PHENYLEPHRINE 40 MCG/ML (10ML) SYRINGE FOR IV PUSH (FOR BLOOD PRESSURE SUPPORT)
PREFILLED_SYRINGE | INTRAVENOUS | Status: AC
  Filled 2015-10-21: qty 20

## 2015-10-21 MED ORDER — PROPOFOL 10 MG/ML IV BOLUS
INTRAVENOUS | Status: AC
  Filled 2015-10-21: qty 20

## 2015-10-21 MED ORDER — HYDROMORPHONE HCL 1 MG/ML IJ SOLN: 1.0000 mg | INTRAMUSCULAR | Status: DC | PRN

## 2015-10-21 MED ORDER — FENTANYL CITRATE (PF) 100 MCG/2ML IJ SOLN
25.0000 ug | INTRAMUSCULAR | Status: DC | PRN
  Administered 2015-10-21: 50 ug via INTRAVENOUS

## 2015-10-21 MED ORDER — CARVEDILOL 25 MG PO TABS
25.0000 mg | ORAL_TABLET | Freq: Every day | ORAL | Status: DC
  Administered 2015-10-22: 25 mg via ORAL
  Filled 2015-10-21: qty 1

## 2015-10-21 MED ORDER — OLMESARTAN MEDOXOMIL-HCTZ 40-25 MG PO TABS: 1.0000 | ORAL_TABLET | Freq: Every day | ORAL | Status: DC

## 2015-10-21 MED ORDER — SPIRONOLACTONE 25 MG PO TABS
25.0000 mg | ORAL_TABLET | Freq: Every day | ORAL | Status: DC
  Administered 2015-10-22: 25 mg via ORAL
  Filled 2015-10-21: qty 1

## 2015-10-21 MED ORDER — FENTANYL CITRATE (PF) 100 MCG/2ML IJ SOLN
INTRAMUSCULAR | Status: AC
  Filled 2015-10-21: qty 2

## 2015-10-21 MED ORDER — HYDROCHLOROTHIAZIDE 25 MG PO TABS
25.0000 mg | ORAL_TABLET | Freq: Every day | ORAL | Status: DC
  Administered 2015-10-21: 25 mg via ORAL
  Filled 2015-10-21: qty 1

## 2015-10-21 MED ORDER — PANTOPRAZOLE SODIUM 40 MG PO TBEC
40.0000 mg | DELAYED_RELEASE_TABLET | Freq: Every day | ORAL | Status: DC
  Administered 2015-10-22: 40 mg via ORAL
  Filled 2015-10-21: qty 1

## 2015-10-21 MED ORDER — GLYBURIDE-METFORMIN 2.5-500 MG PO TABS: 2.0000 | ORAL_TABLET | Freq: Two times a day (BID) | ORAL | Status: DC

## 2015-10-21 MED ORDER — ASPIRIN EC 81 MG PO TBEC
81.0000 mg | DELAYED_RELEASE_TABLET | Freq: Every day | ORAL | Status: DC
  Administered 2015-10-22: 81 mg via ORAL
  Filled 2015-10-21: qty 1

## 2015-10-21 MED ORDER — ONDANSETRON HCL 4 MG/2ML IJ SOLN: 4.0000 mg | Freq: Four times a day (QID) | INTRAMUSCULAR | Status: DC | PRN

## 2015-10-21 MED ORDER — ROPINIROLE HCL 1 MG PO TABS
2.0000 mg | ORAL_TABLET | Freq: Every day | ORAL | Status: DC
  Administered 2015-10-21: 2 mg via ORAL
  Filled 2015-10-21: qty 2

## 2015-10-21 MED ORDER — ONDANSETRON 4 MG PO TBDP
4.0000 mg | ORAL_TABLET | Freq: Four times a day (QID) | ORAL | Status: DC | PRN
  Filled 2015-10-21: qty 1

## 2015-10-21 MED ORDER — FENTANYL CITRATE (PF) 250 MCG/5ML IJ SOLN
INTRAMUSCULAR | Status: AC
  Filled 2015-10-21: qty 5

## 2015-10-21 MED ORDER — PHENYLEPHRINE HCL 10 MG/ML IJ SOLN
INTRAMUSCULAR | Status: DC | PRN
  Administered 2015-10-21: 80 ug via INTRAVENOUS

## 2015-10-21 MED ORDER — MIDAZOLAM HCL 2 MG/2ML IJ SOLN
INTRAMUSCULAR | Status: AC
  Filled 2015-10-21: qty 2

## 2015-10-21 MED ORDER — OXYCODONE-ACETAMINOPHEN 5-325 MG PO TABS
1.0000 | ORAL_TABLET | Freq: Three times a day (TID) | ORAL | Status: DC | PRN
  Administered 2015-10-21 – 2015-10-22 (×2): 1 via ORAL
  Filled 2015-10-21 (×2): qty 1

## 2015-10-21 MED ORDER — EPHEDRINE SULFATE 50 MG/ML IJ SOLN
INTRAMUSCULAR | Status: DC | PRN
  Administered 2015-10-21 (×2): 10 mg via INTRAVENOUS

## 2015-10-21 MED ORDER — LACTATED RINGERS IV SOLN
INTRAVENOUS | Status: DC
  Administered 2015-10-21: 15:00:00 via INTRAVENOUS

## 2015-10-21 MED ORDER — 0.9 % SODIUM CHLORIDE (POUR BTL) OPTIME
TOPICAL | Status: DC | PRN
  Administered 2015-10-21: 1000 mL

## 2015-10-21 MED ORDER — CEFAZOLIN SODIUM-DEXTROSE 2-3 GM-% IV SOLR
2.0000 g | Freq: Four times a day (QID) | INTRAVENOUS | Status: AC
  Administered 2015-10-21 – 2015-10-22 (×3): 2 g via INTRAVENOUS
  Filled 2015-10-21 (×3): qty 50

## 2015-10-21 SURGICAL SUPPLY — 35 items
BLADE SAW SGTL MED 73X18.5 STR (BLADE) IMPLANT
BNDG COHESIVE 3X5 TAN STRL LF (GAUZE/BANDAGES/DRESSINGS) ×2 IMPLANT
BNDG COHESIVE 4X5 TAN STRL (GAUZE/BANDAGES/DRESSINGS) ×2 IMPLANT
BNDG GAUZE ELAST 4 BULKY (GAUZE/BANDAGES/DRESSINGS) ×2 IMPLANT
COVER SURGICAL LIGHT HANDLE (MISCELLANEOUS) ×4 IMPLANT
DRAPE U-SHAPE 47X51 STRL (DRAPES) ×4 IMPLANT
DRSG ADAPTIC 3X8 NADH LF (GAUZE/BANDAGES/DRESSINGS) ×3 IMPLANT
DRSG KUZMA FLUFF (GAUZE/BANDAGES/DRESSINGS) ×2 IMPLANT
DRSG PAD ABDOMINAL 8X10 ST (GAUZE/BANDAGES/DRESSINGS) ×4 IMPLANT
DURAPREP 26ML APPLICATOR (WOUND CARE) ×2 IMPLANT
ELECT REM PT RETURN 9FT ADLT (ELECTROSURGICAL) ×2
ELECTRODE REM PT RTRN 9FT ADLT (ELECTROSURGICAL) ×1 IMPLANT
GAUZE SPONGE 4X4 12PLY STRL (GAUZE/BANDAGES/DRESSINGS) ×2 IMPLANT
GLOVE BIOGEL PI IND STRL 7.0 (GLOVE) IMPLANT
GLOVE BIOGEL PI IND STRL 9 (GLOVE) ×1 IMPLANT
GLOVE BIOGEL PI INDICATOR 7.0 (GLOVE) ×1
GLOVE BIOGEL PI INDICATOR 9 (GLOVE) ×1
GLOVE SURG ORTHO 9.0 STRL STRW (GLOVE) ×2 IMPLANT
GOWN STRL REUS W/ TWL LRG LVL3 (GOWN DISPOSABLE) ×1 IMPLANT
GOWN STRL REUS W/ TWL XL LVL3 (GOWN DISPOSABLE) ×2 IMPLANT
GOWN STRL REUS W/TWL LRG LVL3 (GOWN DISPOSABLE) ×4
GOWN STRL REUS W/TWL XL LVL3 (GOWN DISPOSABLE) ×6
KIT BASIN OR (CUSTOM PROCEDURE TRAY) ×2 IMPLANT
KIT ROOM TURNOVER OR (KITS) ×2 IMPLANT
NS IRRIG 1000ML POUR BTL (IV SOLUTION) ×2 IMPLANT
PACK ORTHO EXTREMITY (CUSTOM PROCEDURE TRAY) ×2 IMPLANT
PAD ARMBOARD 7.5X6 YLW CONV (MISCELLANEOUS) ×4 IMPLANT
SPONGE GAUZE 4X4 12PLY STER LF (GAUZE/BANDAGES/DRESSINGS) ×1 IMPLANT
SPONGE LAP 18X18 X RAY DECT (DISPOSABLE) ×2 IMPLANT
STOCKINETTE IMPERVIOUS LG (DRAPES) IMPLANT
SUT ETHILON 2 0 PSLX (SUTURE) ×4 IMPLANT
TOWEL OR 17X24 6PK STRL BLUE (TOWEL DISPOSABLE) ×2 IMPLANT
TOWEL OR 17X26 10 PK STRL BLUE (TOWEL DISPOSABLE) ×2 IMPLANT
UNDERPAD 30X30 INCONTINENT (UNDERPADS AND DIAPERS) ×2 IMPLANT
WATER STERILE IRR 1000ML POUR (IV SOLUTION) ×2 IMPLANT

## 2015-10-21 NOTE — Progress Notes (Signed)
PHARMACIST - PHYSICIAN COMMUNICATION  CONCERNING:  METFORMIN SAFE ADMINISTRATION POLICY  RECOMMENDATION: Metformin has been placed on DISCONTINUE (rejected order) STATUS and should be reordered only after any of the conditions below are ruled out.  Current Safety recommendations include avoiding metformin for a minimum of 48 hours after the patient's exposure to intravenous contrast media for the following conditions:  . eGFR < 60 ml/min  . Liver disease, alcoholism, heart failure, intra-arterial administration of contrast  DESCRIPTION:  The Pharmacy Committee has adopted a policy that restricts the use of metformin in hospitalized patients until all the contraindications to administration have been ruled out. Specific contraindications are: [x]  Serum creatinine ? 1.5 for males, EGFR <60 ml/min []  Serum creatinine ? 1.4 for females []  Shock, acute MI, sepsis, hypoxemia, dehydration []  Planned administration of intravenous iodinated contrast media when eGFR < 45m/min, Liver Disease, alcoholism, heart failure or intra-arterial administration of contrast []  Heart Failure patients with low EF []  Acute or chronic metabolic acidosis (including DKA)   LReatha Harps Pharm.D., BCPS Clinical Pharmacist

## 2015-10-21 NOTE — Anesthesia Preprocedure Evaluation (Addendum)
Anesthesia Evaluation  Patient identified by MRN, date of birth, ID band Patient awake    Reviewed: Allergy & Precautions, H&P , NPO status , Patient's Chart, lab work & pertinent test results, reviewed documented beta blocker date and time   Airway Mallampati: II  TM Distance: >3 FB Neck ROM: full    Dental  (+) Dental Advisory Given, Edentulous Upper, Edentulous Lower   Pulmonary neg pulmonary ROS, sleep apnea , COPD, former smoker,    Pulmonary exam normal breath sounds clear to auscultation       Cardiovascular hypertension, Pt. on home beta blockers and Pt. on medications + angina with exertion + CAD, + Cardiac Stents, + Peripheral Vascular Disease and +CHF  Normal cardiovascular exam Rhythm:regular Rate:Normal  Good EF. Mild diastolic dysfunction   Neuro/Psych  CEA. neuropathy negative psych ROS   GI/Hepatic Neg liver ROS, hiatal hernia, Bowel prep,GERD  Medicated and Controlled,Barrett's esophagus   Endo/Other  diabetes, Poorly Controlled, Type 2, Insulin Dependent, Oral Hypoglycemic Agents  Renal/GU negative Renal ROS  negative genitourinary   Musculoskeletal   Abdominal   Peds  Hematology negative hematology ROS (+)   Anesthesia Other Findings   Reproductive/Obstetrics negative OB ROS                            Anesthesia Physical Anesthesia Plan  ASA: III  Anesthesia Plan: General   Post-op Pain Management:    Induction: Intravenous  Airway Management Planned: LMA  Additional Equipment:   Intra-op Plan:   Post-operative Plan:   Informed Consent: I have reviewed the patients History and Physical, chart, labs and discussed the procedure including the risks, benefits and alternatives for the proposed anesthesia with the patient or authorized representative who has indicated his/her understanding and acceptance.   Dental Advisory Given  Plan Discussed with: CRNA and  Surgeon  Anesthesia Plan Comments:         Anesthesia Quick Evaluation

## 2015-10-21 NOTE — Progress Notes (Signed)
Orthopedic Tech Progress Note Patient Details:  [name] 16-Mar-1956 [id number]  Ortho Devices Type of Ortho Device: Postop shoe/boot Ortho Device/Splint Location: LLE Ortho Device/Splint Interventions: Ordered, Application   Braulio Bosch 10/21/2015, 7:38 PM

## 2015-10-21 NOTE — Anesthesia Postprocedure Evaluation (Signed)
Anesthesia Post Note  Patient: [name]  Procedure(s) Performed: Procedure(s) (LRB): Left Foot 5th Ray Amputation (Left)  Patient location during evaluation: PACU Anesthesia Type: General Level of consciousness: awake and alert Pain management: pain level controlled Vital Signs Assessment: post-procedure vital signs reviewed and stable Respiratory status: spontaneous breathing, nonlabored ventilation, respiratory function stable and patient connected to nasal cannula oxygen Cardiovascular status: blood pressure returned to baseline and stable Postop Assessment: no signs of nausea or vomiting Anesthetic complications: no    Last Vitals:  Filed Vitals:   10/21/15 1808 10/21/15 1822  BP: 121/70 130/67  Pulse: 66 62  Temp: 36.2 C   Resp: 16 15    Last Pain:  Filed Vitals:   10/21/15 1826  PainSc: 5                  Sherlynn Tourville L

## 2015-10-21 NOTE — Progress Notes (Signed)
Pt made aware that MD stated that he can take Plavix today as prescribed. Pt verbalized understanding.

## 2015-10-21 NOTE — H&P (Signed)
[name] is an 60 y.o. male.   Chief Complaint: Osteomyelitis abscess left foot fifth metatarsal head HPI: Patient is a 60 year old gentleman with history of peripheral vascular disease who is status post a right below the knee amputation who presents at this time with osteomyelitis ulceration left foot fifth ray and presents for fifth ray amputation.  Past Medical History  Diagnosis Date  . Carotid artery occlusion 11/10/10    LEFT CAROTID ENDARTERECTOMY  . Diabetes mellitus   . Hypertension   . Hyperlipidemia   . COPD (chronic obstructive pulmonary disease) ([place])   . DJD (degenerative joint disease)   . GERD (gastroesophageal reflux disease)   . Substernal chest pain   . 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 Slurred speech     AS PER WIFE IN D/C NOTE 11/10/10  . PAD (peripheral artery disease) ([place])     Distal aortogram June 2012. Atherectomy left popliteal artery July 2012.   . Barrett's esophagus   . Fatty liver   . Diverticulosis of colon (without mention of hemorrhage)   . Emphysema of lung ([place])   . Osteomyelitis ([place])   . Complication of anesthesia     BP WENT UP AT [facility] "  . Sleep apnea     has not used CPAP in e years - took self off  . H/O hiatal hernia   . Neuromuscular disorder ([place])     peripheral neuropathy  . Full dentures   . Wears glasses   . CHF (congestive heart failure) ([place])   . History of echocardiogram 08/22/2011    Mildly hypertrophic left ventricle with normal systolic function and mild diastolic dysfunction. mildly dilated left atrium. no significant valvular abnormalities. no pericardial effusion no change from previous study 10/2010. the ejection fraction =>55%  . History of cardiovascular stress test 03/23/2011    ejection fraction measured 51%, with an end-diastolic volume of 123456 ml and an end-systolic volume of 81 ml. no evidence of myocardial ischemia.  . H/O Doppler ultrasound 06/28/2012    Lower venous duplex left--  findings consistent with acute deep vein thrombosis involving the personal vein of the left lower extremity. No evidence of Baker's cyst on the left. Duplex scan of the left lower extremity arteries reveal adequate blood flow.   . History of Holter monitoring 02/14/2012    Scan quality is fair due to baseline artifact. Predominant rhythm is Sinus. Veentricular Ectopy was noted in single beat form. The total recording time was 24:00 hours. total beats =[id number]. the total time analyzed was 23:58 hours. Heart  Max = 102 at 1:18:22, Min= 52 at 9:56:19 am., Avg= 70. There were 0 pause(s) > 2.5 seconds. the longest pause = 0.00 sec. at 1:51 pm(1)  . OSA (obstructive sleep apnea) 12/26/08    AHI of 6.3/hr overall, 32.7/hr during REM sleep and an RDI of 24.8/hr and 50.9/hr.    Past Surgical History  Procedure Laterality Date  . Laminectomy      X 3 LUMBAR AND X 2 CERVICAL SPINE OPERATIONS  . Carotid endarterectomy  11/10/10  . Hernia repair      LEFT INGUINAL AND UMBILICAL REPAIRS  . Anterior fusion cervical spine  02/06/06    C4-5, C5-6, C6-7; SURGEON DR. MAX COHEN  . Penile prosthesis implant  08/14/05    INFRAPUBIC INSERTION OF INFLATABLE PENILE PROSTHESIS; SURGEON DR. Amalia Hailey  . Laparoscopic cholecystectomy w/ cholangiography  11/09/04    SURGEON DR. Eddie Dibbles  S. TOTH  . Total knee arthroplasty  07/2002    RIGHT KNEE ; SURGEON  DR. [name] ALSO HAD ARTHROSCOPIC RIGHT KNEE IN  10/2001  . Shoulder arthroscopy    . Cholecystectomy    . Amputation  11/05/2011    Procedure: AMPUTATION RAY;  Surgeon: Wylene Simmer, MD;  Location: [place];  Service: Orthopedics;  Laterality: Right;  Amputation of Right 4&5th Toes  . Femoral artery stent      x6  . Amputation Left 11/26/2012    Procedure: AMPUTATION RAY;  Surgeon: Wylene Simmer, MD;  Location: [place];  Service: Orthopedics;  Laterality: Left;  fourth ray amputation  . Cardiac catheterization  10/31/04    2009  . Carpal tunnel release Right 10/21/2013    Procedure: RIGHT  CARPAL TUNNEL RELEASE;  Surgeon: Wynonia Sours, MD;  Location: [place];  Service: Orthopedics;  Laterality: Right;  . Ulnar nerve transposition Right 10/21/2013    Procedure: RIGHT ELBOW  ULNAR NERVE DECOMPRESSION;  Surgeon: Wynonia Sours, MD;  Location: [place];  Service: Orthopedics;  Laterality: Right;  . Joint replacement Right 2001    Total  . Spine surgery    . Neck surgery    . Esophageal manometry Bilateral 07/19/2014    Procedure: ESOPHAGEAL MANOMETRY (EM);  Surgeon: Jerene Bears, MD;  Location: [facility];  Service: Gastroenterology;  Laterality: Bilateral;  . Lower extremity angiogram N/A 03/19/2012    Procedure: LOWER EXTREMITY ANGIOGRAM;  Surgeon: Burnell Blanks, MD;  Location: [facility] CATH LAB;  Service: Cardiovascular;  Laterality: N/A;  . Amputation Right 08/27/2014    Procedure: Transmetatarsal Amputation;  Surgeon: Newt Minion, MD;  Location: [place];  Service: Orthopedics;  Laterality: Right;  . Left heart catheterization with coronary angiogram N/A 10/29/2014    Procedure: LEFT HEART CATHETERIZATION WITH CORONARY ANGIOGRAM;  Surgeon: Laverda Page, MD;  Location: [facility] CATH LAB;  Service: Cardiovascular;  Laterality: N/A;  . Percutaneous coronary stent intervention (pci-s) Right 10/29/2014    Procedure: PERCUTANEOUS CORONARY STENT INTERVENTION (PCI-S);  Surgeon: Laverda Page, MD;  Location: [facility] CATH LAB;  Service: Cardiovascular;  Laterality: Right;  . Amputation Right 01/14/2015    Procedure: AMPUTATION BELOW KNEE;  Surgeon: Newt Minion, MD;  Location: [place];  Service: Orthopedics;  Laterality: Right;  . Carotid endarterectomy Left 11/10/2010    Subtotal occlusion of left internal carotid artery with left hemispheric transient ischemic attacks.  . Tonsillectomy    . Colonoscopy      Family History  Problem Relation Age of Onset  . Heart disease Father     Before age 66-  CAD, BPG  . Diabetes Father     Amputation  . Cancer  Father     PROSTATE  . Hyperlipidemia Father   . Hypertension Father   . Heart attack Father     Triple BPG  . Varicose Veins Father   . Arthritis      GRANDMOTHER  . Hypertension      OTHER FAMILY MEMBERS  . Colon cancer Brother   . Cancer Brother     Colon  . Diabetes Brother   . Heart disease Brother 63    A-Fib. Before age 8  . Hyperlipidemia Brother   . Hypertension Brother   . Cancer Sister     Breast  . Hyperlipidemia Sister   . Hypertension Sister   . Hypertension Son    Social History:  reports that he quit smoking  about 3 years ago. His smoking use included E-cigarettes. He has a 17.5 pack-year smoking history. He has never used smokeless tobacco. He reports that he drinks alcohol. He reports that he does not use illicit drugs.  Allergies:  Allergies  Allergen Reactions  . Ivp Dye [Iodinated Diagnostic Agents] Anaphylaxis    Breathing problems  . Adhesive [Tape] Rash    Rash after 2-3 days use    No prescriptions prior to admission    No results found for this or any previous visit (from the past 48 hour(s)). No results found.  Review of Systems  All other systems reviewed and are negative.   There were no vitals taken for this visit. Physical Exam  On examination patient has osteomyelitis ulceration left foot fifth metatarsal head. Assessment/Plan Assessment: Left foot fifth metatarsal head osteomyelitis and ulceration.  Plan: We'll plan for left foot fifth ray amputation. Risks and benefits were discussed patient states he understands and wishes to proceed at this time.    Newt Minion, MD 10/21/2015, 7:02 AM

## 2015-10-21 NOTE — Anesthesia Procedure Notes (Signed)
Procedure Name: LMA Insertion Date/Time: 10/21/2015 5:22 PM Performed by: Melina Copa, Trei Schoch R Pre-anesthesia Checklist: Patient identified, Emergency Drugs available, Suction available, Patient being monitored and Timeout performed Patient Re-evaluated:Patient Re-evaluated prior to inductionOxygen Delivery Method: Circle system utilized Preoxygenation: Pre-oxygenation with 100% oxygen Intubation Type: IV induction Ventilation: Mask ventilation without difficulty LMA: LMA inserted LMA Size: 5.0 Number of attempts: 1 Placement Confirmation: positive ETCO2 Tube secured with: Tape Dental Injury: Teeth and Oropharynx as per pre-operative assessment

## 2015-10-21 NOTE — Transfer of Care (Signed)
Immediate Anesthesia Transfer of Care Note  Patient: [name]  Procedure(s) Performed: Procedure(s): Left Foot 5th Ray Amputation (Left)  Patient Location: PACU  Anesthesia Type:General  Level of Consciousness: awake, oriented and patient cooperative  Airway & Oxygen Therapy: Patient Spontanous Breathing and Patient connected to nasal cannula oxygen  Post-op Assessment: Report given to RN, Post -op Vital signs reviewed and stable and Patient moving all extremities  Post vital signs: Reviewed and stable  Last Vitals:  Filed Vitals:   10/21/15 1414  BP: 109/52  Pulse: 79  Temp: 36.7 C  Resp: 18    Complications: No apparent anesthesia complications

## 2015-10-21 NOTE — Op Note (Signed)
10/21/2015  5:50 PM  PATIENT:  [name]    PRE-OPERATIVE DIAGNOSIS:  Osteomyelitis Left 5th Metatarsal Head  POST-OPERATIVE DIAGNOSIS:  Same  PROCEDURE:  Left Foot 5th Ray Amputation, local tissue rearrangement for wound 3 x 8 cm  SURGEON:  Suhana Wilner V, MD  PHYSICIAN ASSISTANT:None ANESTHESIA:   General  PREOPERATIVE INDICATIONS:  [name] is a  60 y.o. male with a diagnosis of Osteomyelitis Left 5th Metatarsal Head who failed conservative measures and elected for surgical management.    The risks benefits and alternatives were discussed with the patient preoperatively including but not limited to the risks of infection, bleeding, nerve injury, cardiopulmonary complications, the need for revision surgery, among others, and the patient was willing to proceed.  OPERATIVE IMPLANTS: None  OPERATIVE FINDINGS: Good petechial bleeding  OPERATIVE PROCEDURE: Patient was brought to the operating room and underwent a general anesthetic. After adequate levels anesthesia obtained patient's left lower extremity was prepped using DuraPrep draped into a sterile field. A timeout was called. A racquet incision was made around the ulcer and first ray. The first ray was resected through the base. This left a wound that was 3 x 8 cm. The wound was irrigated with normal saline. Electrocautery was used for hemostasis. Local tissue rearrangement was used for wound closure and 2-0 nylon was used to close the wound. Patient was extubated taken to the PACU in stable condition plan for overnight observation.

## 2015-10-22 DIAGNOSIS — E1169 Type 2 diabetes mellitus with other specified complication: Secondary | ICD-10-CM | POA: Diagnosis not present

## 2015-10-22 LAB — GLUCOSE, CAPILLARY
Glucose-Capillary: 97 mg/dL (ref 65–99)
Glucose-Capillary: 133 mg/dL — ABNORMAL HIGH (ref 65–99)

## 2015-10-22 NOTE — Evaluation (Signed)
Physical Therapy Evaluation Patient Details Name: [name] MRN: [id number] DOB: [birth year] Today's Date: 10/22/2015   History of Present Illness  Pt is a 60 y/o M s/p Lt foot 5th ray amputation.  Pt's PMH includes Rt BKA, COPD, visual color changes, CHF, lumbar and cervical laminectomy, carotid endarterectomy,k anterior fusion cervical spine, Rt TKA.  Clinical Impression  Patient is s/p above surgery resulting in functional limitations due to the deficits listed below (see PT Problem List). Pt would benefit from HHPT at d/c for gait training using RW w/ Rt LE prosthesis and TDWB Lt LE.  He will have 24/7 assist available at d/c from his stepson.  He currently requires min guard assist for transfers and short distance ambulation. Patient will benefit from skilled PT to increase their independence and safety with mobility to allow discharge to the venue listed below.      Follow Up Recommendations Home health PT;Supervision for mobility/OOB    Equipment Recommendations  None recommended by PT    Recommendations for Other Services       Precautions / Restrictions Precautions Precautions: Fall Required Braces or Orthoses: Other Brace/Splint Other Brace/Splint: post op shoe Lt Restrictions Weight Bearing Restrictions: Yes RLE Weight Bearing: Touchdown weight bearing      Mobility  Bed Mobility Overal bed mobility: Independent Bed Mobility: Supine to Sit     Supine to sit: Independent     General bed mobility comments: no difficultiy  Transfers Overall transfer level: Needs assistance Equipment used: Rolling walker (2 wheeled) (bariatric) Transfers: Sit to/from Stand Sit to Stand: Min guard         General transfer comment: Safe technique using RW for sit<>stand.  Cues for upright posture.  Ambulation/Gait Ambulation/Gait assistance: Min guard Ambulation Distance (Feet): 25 Feet Assistive device: Rolling walker (2 wheeled) (bariatric) Gait Pattern/deviations:   (hop on Lt LE prosthesis)   Gait velocity interpretation: Below normal speed for age/gender General Gait Details: Pt able to hop on Lt LE prosthesis w/ light TDWB at times to steady.  Min guard assist for safety.  Cues for upright posture  Stairs            Wheelchair Mobility    Modified Rankin (Stroke Patients Only)       Balance Overall balance assessment: Needs assistance Sitting-balance support: No upper extremity supported;Feet supported Sitting balance-Leahy Scale: Good     Standing balance support: Bilateral upper extremity supported;During functional activity Standing balance-Leahy Scale: Poor Standing balance comment: Relies on RW for support                             Pertinent Vitals/Pain Pain Assessment: 0-10 Pain Score: 5  Pain Location: Lt foot Pain Descriptors / Indicators: Aching Pain Intervention(s): Monitored during session;Limited activity within patient's tolerance;Repositioned    Home Living Family/patient expects to be discharged to:: Private residence Living Arrangements: Spouse/significant other;Other (Comment) (stepson) Available Help at Discharge: Family;Available 24 hours/day (wife works, Environmental consultant at home 24/7) Type of Home: [place]: [place]: One [place]: Crutches;Walker - 2 wheels;Wheelchair - Brewing technologist;Wheelchair - power;Cane - single point (knee scooter; Bariatric RW)      Prior Function Level of Independence: Independent with assistive device(s)         Comments: Was not using an AD prior to foot infection.  Has been using crutch. Pt able to ambulate short distances in community but at  times needs to use electric scooter in grocery store if he fatigues.  Ind w/ all ADLs.     Hand Dominance   Dominant Hand: Right    Extremity/Trunk Assessment   Upper Extremity Assessment: Defer to OT evaluation           Lower Extremity Assessment: RLE  deficits/detail;LLE deficits/detail RLE Deficits / Details: Rt BKA LLE Deficits / Details: limited ROM and strength due to Lt 5th ray amputation     Communication   Communication: HOH  Cognition Arousal/Alertness: Awake/alert Behavior During Therapy: WFL for tasks assessed/performed Overall Cognitive Status: Within Functional Limits for tasks assessed                      General Comments      Exercises        Assessment/Plan    PT Assessment Patient needs continued PT services  PT Diagnosis Acute pain;Difficulty walking   PT Problem List Decreased strength;Decreased range of motion;Decreased activity tolerance;Decreased balance;Decreased mobility;Decreased knowledge of use of DME;Decreased safety awareness;Decreased knowledge of precautions;Impaired sensation;Pain;Obesity  PT Treatment Interventions DME instruction;Gait training;Functional mobility training;Therapeutic activities;Therapeutic exercise;Balance training;Neuromuscular re-education;Patient/family education;Wheelchair mobility training   PT Goals (Current goals can be found in the Care Plan section) Acute Rehab PT Goals Patient Stated Goal: to go  home PT Goal Formulation: With patient Time For Goal Achievement: 10/29/15 Potential to Achieve Goals: Good    Frequency Min 3X/week   Barriers to discharge        Co-evaluation               End of Session Equipment Utilized During Treatment: Gait belt Activity Tolerance: Patient limited by fatigue Patient left: in chair;with call bell/phone within reach;with family/visitor present Nurse Communication: Mobility status;Weight bearing status    Functional Assessment Tool Used: Clinical Judgement Functional Limitation: Mobility: Walking and moving around Mobility: Walking and Moving Around Current Status [phone]): At least 1 percent but less than 20 percent impaired, limited or restricted Mobility: Walking and Moving Around Goal Status [phone]): At  least 1 percent but less than 20 percent impaired, limited or restricted    Time: 0911-0943 PT Time Calculation (min) (ACUTE ONLY): 32 min   Charges:   PT Evaluation $PT Eval Moderate Complexity: 1 Procedure PT Treatments $Therapeutic Activity: 8-22 mins   PT G Codes:   PT G-Codes **NOT FOR INPATIENT CLASS** Functional Assessment Tool Used: Clinical Judgement Functional Limitation: Mobility: Walking and moving around Mobility: Walking and Moving Around Current Status [phone]): At least 1 percent but less than 20 percent impaired, limited or restricted Mobility: Walking and Moving Around Goal Status [phone]): At least 1 percent but less than 20 percent impaired, limited or restricted   Joslyn Hy PT, DPT [phone] Pager: [phone] 10/22/2015, 10:49 AM

## 2015-10-22 NOTE — Care Management Note (Signed)
    Case Management Note  Patient Details  Name: ZYIEN DIEKEN MRN: [id number] Date of Birth: 04-23-1956  Subjective/Objective:      60 yr old male s/p left fifth toe amputation.              Action/Plan: patient was preoperatively setup with [facility] care, no changes. No DME needs. No further case management needs.    Expected Discharge Date:    10/22/15              Expected Discharge Plan:  [place]  In-House Referral:  NA  Discharge planning Services  CM Consult  Post Acute Care Choice:  Home Health Choice offered to:     DME Arranged:  N/A DME Agency:  NA  HH Arranged:  PT HH Agency:  [facility]  Status of Service:  Completed, signed off  Medicare Important Message Given:    Date Medicare IM Given:    Medicare IM give by:    Date Additional Medicare IM Given:    Additional Medicare Important Message give by:     If discussed at [place] of Stay Meetings, dates discussed:    Additional Comments:  Ninfa Meeker, RN 10/22/2015, 11:28 AM

## 2015-10-22 NOTE — Progress Notes (Addendum)
NAD, no events. AFVSS Dressing c/d/i Stable for dc home today Patient has sufficient pain meds at home  N. Eduard Roux, MD [facility] [phone] 8:26 AM

## 2015-10-24 ENCOUNTER — Encounter (HOSPITAL_COMMUNITY): Payer: Self-pay | Admitting: Orthopedic Surgery

## 2015-11-16 ENCOUNTER — Ambulatory Visit: Payer: BLUE CROSS/BLUE SHIELD | Admitting: Infectious Diseases

## 2015-12-16 ENCOUNTER — Inpatient Hospital Stay (HOSPITAL_COMMUNITY)
Admission: EM | Admit: 2015-12-16 | Discharge: 2015-12-18 | DRG: 603 | Disposition: A | Discharge status: Home Health | Payer: BLUE CROSS/BLUE SHIELD | Attending: Internal Medicine | Admitting: Internal Medicine

## 2015-12-16 ENCOUNTER — Emergency Department (HOSPITAL_COMMUNITY): Payer: BLUE CROSS/BLUE SHIELD

## 2015-12-16 ENCOUNTER — Encounter (HOSPITAL_COMMUNITY): Payer: Self-pay | Admitting: *Deleted

## 2015-12-16 DIAGNOSIS — Z96651 Presence of right artificial knee joint: Secondary | ICD-10-CM | POA: Diagnosis present

## 2015-12-16 DIAGNOSIS — K2270 Barrett's esophagus without dysplasia: Secondary | ICD-10-CM | POA: Diagnosis present

## 2015-12-16 DIAGNOSIS — R740 Nonspecific elevation of levels of transaminase and lactic acid dehydrogenase [LDH]: Secondary | ICD-10-CM | POA: Diagnosis present

## 2015-12-16 DIAGNOSIS — Z79899 Other long term (current) drug therapy: Secondary | ICD-10-CM

## 2015-12-16 DIAGNOSIS — R1013 Epigastric pain: Secondary | ICD-10-CM | POA: Diagnosis present

## 2015-12-16 DIAGNOSIS — Z87891 Personal history of nicotine dependence: Secondary | ICD-10-CM

## 2015-12-16 DIAGNOSIS — L97529 Non-pressure chronic ulcer of other part of left foot with unspecified severity: Secondary | ICD-10-CM | POA: Diagnosis present

## 2015-12-16 DIAGNOSIS — E1142 Type 2 diabetes mellitus with diabetic polyneuropathy: Secondary | ICD-10-CM | POA: Diagnosis present

## 2015-12-16 DIAGNOSIS — Z833 Family history of diabetes mellitus: Secondary | ICD-10-CM

## 2015-12-16 DIAGNOSIS — N400 Enlarged prostate without lower urinary tract symptoms: Secondary | ICD-10-CM | POA: Diagnosis present

## 2015-12-16 DIAGNOSIS — E785 Hyperlipidemia, unspecified: Secondary | ICD-10-CM | POA: Diagnosis present

## 2015-12-16 DIAGNOSIS — L03116 Cellulitis of left lower limb: Secondary | ICD-10-CM | POA: Diagnosis present

## 2015-12-16 DIAGNOSIS — Z8249 Family history of ischemic heart disease and other diseases of the circulatory system: Secondary | ICD-10-CM | POA: Diagnosis not present

## 2015-12-16 DIAGNOSIS — Z7982 Long term (current) use of aspirin: Secondary | ICD-10-CM

## 2015-12-16 DIAGNOSIS — I5032 Chronic diastolic (congestive) heart failure: Secondary | ICD-10-CM | POA: Diagnosis present

## 2015-12-16 DIAGNOSIS — R112 Nausea with vomiting, unspecified: Secondary | ICD-10-CM | POA: Diagnosis not present

## 2015-12-16 DIAGNOSIS — I5030 Unspecified diastolic (congestive) heart failure: Secondary | ICD-10-CM | POA: Diagnosis present

## 2015-12-16 DIAGNOSIS — E11621 Type 2 diabetes mellitus with foot ulcer: Secondary | ICD-10-CM | POA: Diagnosis present

## 2015-12-16 DIAGNOSIS — Z91041 Radiographic dye allergy status: Secondary | ICD-10-CM

## 2015-12-16 DIAGNOSIS — Z91048 Other nonmedicinal substance allergy status: Secondary | ICD-10-CM | POA: Diagnosis not present

## 2015-12-16 DIAGNOSIS — J449 Chronic obstructive pulmonary disease, unspecified: Secondary | ICD-10-CM | POA: Diagnosis present

## 2015-12-16 DIAGNOSIS — E08621 Diabetes mellitus due to underlying condition with foot ulcer: Secondary | ICD-10-CM | POA: Diagnosis not present

## 2015-12-16 DIAGNOSIS — Z6829 Body mass index (BMI) 29.0-29.9, adult: Secondary | ICD-10-CM

## 2015-12-16 DIAGNOSIS — Z955 Presence of coronary angioplasty implant and graft: Secondary | ICD-10-CM | POA: Diagnosis not present

## 2015-12-16 DIAGNOSIS — G4733 Obstructive sleep apnea (adult) (pediatric): Secondary | ICD-10-CM | POA: Diagnosis present

## 2015-12-16 DIAGNOSIS — Z89422 Acquired absence of other left toe(s): Secondary | ICD-10-CM

## 2015-12-16 DIAGNOSIS — I2510 Atherosclerotic heart disease of native coronary artery without angina pectoris: Secondary | ICD-10-CM | POA: Diagnosis present

## 2015-12-16 DIAGNOSIS — I10 Essential (primary) hypertension: Secondary | ICD-10-CM | POA: Diagnosis not present

## 2015-12-16 DIAGNOSIS — K219 Gastro-esophageal reflux disease without esophagitis: Secondary | ICD-10-CM | POA: Diagnosis present

## 2015-12-16 DIAGNOSIS — E669 Obesity, unspecified: Secondary | ICD-10-CM | POA: Diagnosis present

## 2015-12-16 DIAGNOSIS — Z794 Long term (current) use of insulin: Secondary | ICD-10-CM

## 2015-12-16 DIAGNOSIS — E860 Dehydration: Secondary | ICD-10-CM | POA: Diagnosis present

## 2015-12-16 DIAGNOSIS — I110 Hypertensive heart disease with heart failure: Secondary | ICD-10-CM | POA: Diagnosis present

## 2015-12-16 DIAGNOSIS — E119 Type 2 diabetes mellitus without complications: Secondary | ICD-10-CM

## 2015-12-16 DIAGNOSIS — Z791 Long term (current) use of non-steroidal anti-inflammatories (NSAID): Secondary | ICD-10-CM | POA: Diagnosis not present

## 2015-12-16 DIAGNOSIS — L97519 Non-pressure chronic ulcer of other part of right foot with unspecified severity: Secondary | ICD-10-CM | POA: Diagnosis not present

## 2015-12-16 DIAGNOSIS — D649 Anemia, unspecified: Secondary | ICD-10-CM

## 2015-12-16 DIAGNOSIS — I739 Peripheral vascular disease, unspecified: Secondary | ICD-10-CM | POA: Diagnosis not present

## 2015-12-16 DIAGNOSIS — Z89511 Acquired absence of right leg below knee: Secondary | ICD-10-CM

## 2015-12-16 DIAGNOSIS — A419 Sepsis, unspecified organism: Secondary | ICD-10-CM

## 2015-12-16 LAB — CBC WITH DIFFERENTIAL/PLATELET
Basophils Absolute: 0 10*3/uL (ref 0.0–0.1)
Basophils Absolute: 0 10*3/uL (ref 0.0–0.1)
Basophils Relative: 0 %
Basophils Relative: 0 %
Eosinophils Absolute: 0 10*3/uL (ref 0.0–0.7)
Eosinophils Absolute: 0.1 10*3/uL (ref 0.0–0.7)
Eosinophils Relative: 0 %
Eosinophils Relative: 2 %
HCT: 32.1 % — ABNORMAL LOW (ref 39.0–52.0)
HCT: 28.1 % — ABNORMAL LOW (ref 39.0–52.0)
Hemoglobin: 11 g/dL — ABNORMAL LOW (ref 13.0–17.0)
Hemoglobin: 9.7 g/dL — ABNORMAL LOW (ref 13.0–17.0)
Lymphocytes Relative: 19 %
Lymphocytes Relative: 24 %
Lymphs Abs: 1.5 10*3/uL (ref 0.7–4.0)
Lymphs Abs: 1.5 10*3/uL (ref 0.7–4.0)
MCH: 28.3 pg (ref 26.0–34.0)
MCH: 28.4 pg (ref 26.0–34.0)
MCHC: 34.3 g/dL (ref 30.0–36.0)
MCHC: 34.5 g/dL (ref 30.0–36.0)
MCV: 82.5 fL (ref 78.0–100.0)
MCV: 82.4 fL (ref 78.0–100.0)
Monocytes Absolute: 0.6 10*3/uL (ref 0.1–1.0)
Monocytes Absolute: 0.7 10*3/uL (ref 0.1–1.0)
Monocytes Relative: 7 %
Monocytes Relative: 11 %
Neutro Abs: 5.8 10*3/uL (ref 1.7–7.7)
Neutro Abs: 3.9 10*3/uL (ref 1.7–7.7)
Neutrophils Relative %: 74 %
Neutrophils Relative %: 63 %
Platelets: 222 10*3/uL (ref 150–400)
Platelets: 161 10*3/uL (ref 150–400)
RBC: 3.89 MIL/uL — ABNORMAL LOW (ref 4.22–5.81)
RBC: 3.41 MIL/uL — ABNORMAL LOW (ref 4.22–5.81)
RDW: 13.6 % (ref 11.5–15.5)
RDW: 13.6 % (ref 11.5–15.5)
WBC: 7.9 10*3/uL (ref 4.0–10.5)
WBC: 6.3 10*3/uL (ref 4.0–10.5)

## 2015-12-16 LAB — COMPREHENSIVE METABOLIC PANEL
ALT: 13 U/L — ABNORMAL LOW (ref 17–63)
ALT: 12 U/L — ABNORMAL LOW (ref 17–63)
AST: 19 U/L (ref 15–41)
AST: 18 U/L (ref 15–41)
Albumin: 3.7 g/dL (ref 3.5–5.0)
Albumin: 3.2 g/dL — ABNORMAL LOW (ref 3.5–5.0)
Alkaline Phosphatase: 103 U/L (ref 38–126)
Alkaline Phosphatase: 90 U/L (ref 38–126)
Anion gap: 12 (ref 5–15)
Anion gap: 10 (ref 5–15)
BUN: 15 mg/dL (ref 6–20)
BUN: 13 mg/dL (ref 6–20)
CO2: 25 mmol/L (ref 22–32)
CO2: 23 mmol/L (ref 22–32)
Calcium: 9.2 mg/dL (ref 8.9–10.3)
Calcium: 8.3 mg/dL — ABNORMAL LOW (ref 8.9–10.3)
Chloride: 102 mmol/L (ref 101–111)
Chloride: 108 mmol/L (ref 101–111)
Creatinine, Ser: 1.38 mg/dL — ABNORMAL HIGH (ref 0.61–1.24)
Creatinine, Ser: 1.25 mg/dL — ABNORMAL HIGH (ref 0.61–1.24)
GFR calc Af Amer: >60 mL/min (ref >60)
GFR calc Af Amer: >60 mL/min (ref >60)
GFR calc non Af Amer: 54 mL/min — ABNORMAL LOW (ref >60)
GFR calc non Af Amer: >60 mL/min (ref >60)
Glucose, Bld: 287 mg/dL — ABNORMAL HIGH (ref 65–99)
Glucose, Bld: 221 mg/dL — ABNORMAL HIGH (ref 65–99)
Potassium: 4 mmol/L (ref 3.5–5.1)
Potassium: 3.6 mmol/L (ref 3.5–5.1)
Sodium: 139 mmol/L (ref 135–145)
Sodium: 141 mmol/L (ref 135–145)
Total Bilirubin: 0.8 mg/dL (ref 0.3–1.2)
Total Bilirubin: 0.9 mg/dL (ref 0.3–1.2)
Total Protein: 6.5 g/dL (ref 6.5–8.1)
Total Protein: 5.7 g/dL — ABNORMAL LOW (ref 6.5–8.1)

## 2015-12-16 LAB — URINE MICROSCOPIC-ADD ON

## 2015-12-16 LAB — URINALYSIS, ROUTINE W REFLEX MICROSCOPIC
Glucose, UA: >1000 mg/dL — AB
Ketones, ur: 15 mg/dL — AB
Leukocytes, UA: NEGATIVE
Nitrite: NEGATIVE
Protein, ur: 100 mg/dL — AB
Specific Gravity, Urine: 1.04 — ABNORMAL HIGH (ref 1.005–1.030)
pH: 5 (ref 5.0–8.0)

## 2015-12-16 LAB — PROTIME-INR
INR: 1.2 (ref 0.00–1.49)
Prothrombin Time: 15.4 seconds — ABNORMAL HIGH (ref 11.6–15.2)

## 2015-12-16 LAB — I-STAT BETA HCG BLOOD, ED (MC, WL, AP ONLY): I-stat hCG, quantitative: <5 m[IU]/mL (ref <5)

## 2015-12-16 LAB — GLUCOSE, CAPILLARY: Glucose-Capillary: 175 mg/dL — ABNORMAL HIGH (ref 65–99)

## 2015-12-16 LAB — CBG MONITORING, ED: Glucose-Capillary: 284 mg/dL — ABNORMAL HIGH (ref 65–99)

## 2015-12-16 LAB — I-STAT CG4 LACTIC ACID, ED
Lactic Acid, Venous: 2.65 mmol/L (ref 0.5–2.0)
Lactic Acid, Venous: 1.54 mmol/L (ref 0.5–2.0)

## 2015-12-16 LAB — C-REACTIVE PROTEIN: CRP: <0.5 mg/dL (ref <1.0)

## 2015-12-16 LAB — LACTIC ACID, PLASMA: Lactic Acid, Venous: 2.4 mmol/L (ref 0.5–2.0)

## 2015-12-16 LAB — APTT: aPTT: 32 seconds (ref 24–37)

## 2015-12-16 LAB — PREALBUMIN: Prealbumin: 25 mg/dL (ref 18–38)

## 2015-12-16 MED ORDER — SODIUM CHLORIDE 0.9 % IV BOLUS (SEPSIS)
1000.0000 mL | INTRAVENOUS | Status: AC
  Administered 2015-12-16 (×3): 1000 mL via INTRAVENOUS

## 2015-12-16 MED ORDER — PIPERACILLIN-TAZOBACTAM 3.375 G IVPB
3.3750 g | Freq: Three times a day (TID) | INTRAVENOUS | Status: DC
  Administered 2015-12-17 – 2015-12-18 (×5): 3.375 g via INTRAVENOUS
  Filled 2015-12-16 (×7): qty 50

## 2015-12-16 MED ORDER — PIPERACILLIN-TAZOBACTAM 3.375 G IVPB 30 MIN
3.3750 g | Freq: Once | INTRAVENOUS | Status: AC
  Administered 2015-12-16: 3.375 g via INTRAVENOUS
  Filled 2015-12-16: qty 50

## 2015-12-16 MED ORDER — SODIUM CHLORIDE 0.9 % IV BOLUS (SEPSIS): 500.0000 mL | INTRAVENOUS | Status: AC

## 2015-12-16 MED ORDER — SODIUM CHLORIDE 0.9% FLUSH
3.0000 mL | Freq: Two times a day (BID) | INTRAVENOUS | Status: DC
  Administered 2015-12-16 – 2015-12-17 (×2): 3 mL via INTRAVENOUS

## 2015-12-16 MED ORDER — ONDANSETRON HCL 4 MG/2ML IJ SOLN
4.0000 mg | Freq: Once | INTRAMUSCULAR | Status: AC
Start: 2015-12-16 — End: 2015-12-16
  Administered 2015-12-16: 4 mg via INTRAVENOUS
  Filled 2015-12-16: qty 2

## 2015-12-16 MED ORDER — KETOROLAC TROMETHAMINE 30 MG/ML IJ SOLN
30.0000 mg | Freq: Once | INTRAMUSCULAR | Status: AC
  Administered 2015-12-16: 30 mg via INTRAVENOUS
  Filled 2015-12-16: qty 1

## 2015-12-16 MED ORDER — ONDANSETRON HCL 4 MG/2ML IJ SOLN: 4.0000 mg | Freq: Four times a day (QID) | INTRAMUSCULAR | Status: DC | PRN

## 2015-12-16 MED ORDER — INSULIN ASPART 100 UNIT/ML ~~LOC~~ SOLN
0.0000 [IU] | Freq: Three times a day (TID) | SUBCUTANEOUS | Status: DC
  Administered 2015-12-17: 4 [IU] via SUBCUTANEOUS
  Administered 2015-12-17 – 2015-12-18 (×3): 3 [IU] via SUBCUTANEOUS

## 2015-12-16 MED ORDER — FENTANYL 25 MCG/HR TD PT72
50.0000 ug | MEDICATED_PATCH | TRANSDERMAL | Status: DC
  Administered 2015-12-16: 50 ug via TRANSDERMAL
  Filled 2015-12-16: qty 2

## 2015-12-16 MED ORDER — HYDROMORPHONE HCL 1 MG/ML IJ SOLN
1.0000 mg | INTRAMUSCULAR | Status: DC | PRN
  Administered 2015-12-16 – 2015-12-18 (×9): 1 mg via INTRAVENOUS
  Filled 2015-12-16 (×9): qty 1

## 2015-12-16 MED ORDER — INSULIN GLARGINE 100 UNIT/ML ~~LOC~~ SOLN
40.0000 [IU] | Freq: Every day | SUBCUTANEOUS | Status: DC
  Administered 2015-12-16 – 2015-12-17 (×2): 40 [IU] via SUBCUTANEOUS
  Filled 2015-12-16 (×3): qty 0.4

## 2015-12-16 MED ORDER — ENOXAPARIN SODIUM 40 MG/0.4ML ~~LOC~~ SOLN
40.0000 mg | SUBCUTANEOUS | Status: DC
  Administered 2015-12-16 – 2015-12-17 (×2): 40 mg via SUBCUTANEOUS
  Filled 2015-12-16 (×2): qty 0.4

## 2015-12-16 MED ORDER — PREGABALIN 100 MG PO CAPS
100.0000 mg | ORAL_CAPSULE | Freq: Three times a day (TID) | ORAL | Status: DC
  Administered 2015-12-16 – 2015-12-18 (×6): 100 mg via ORAL
  Filled 2015-12-16 (×6): qty 1

## 2015-12-16 MED ORDER — SODIUM CHLORIDE 0.9 % IV SOLN
2000.0000 mg | Freq: Once | INTRAVENOUS | Status: AC
  Administered 2015-12-16: 2000 mg via INTRAVENOUS
  Filled 2015-12-16: qty 2000

## 2015-12-16 MED ORDER — VANCOMYCIN HCL 10 G IV SOLR
1250.0000 mg | INTRAVENOUS | Status: DC
  Administered 2015-12-17: 1250 mg via INTRAVENOUS
  Filled 2015-12-16 (×2): qty 1250

## 2015-12-16 NOTE — Progress Notes (Signed)
Pharmacy Antibiotic Note  [name] is a 60 y.o. male admitted on 12/16/2015 with cellulitis.  Referred from PCP to r/o sepsis.  Had abdominal pain with N/V for several day.  PMH includes below knee amputation of right leg,T2DM, HTN, COPD, and CHF.  Left 5th toe was amputated on 10/21/2015.  Developed an ulcer and prescribed Doxycycline BID.  Pharmacy has been consulted for Vancomycin and Zosyn dosing.  On Admit: Afebrile, HR 82, RR 18, WBC 7.9 LA 2.65, nCrCl 58  Plan: --Vancomycin 2000 mg IV x 1, then 1250 mg q24h --Zosyn 3.375 g IV q8h (extended infusion) --Obtain vanc trough at steady state (goal 10-15) --Follow renal function, clinical course, and cultures  Height: 6\' 2"  (188 cm) Weight: 230 lb 1 oz (104.356 kg) IBW/kg (Calculated) : 82.2  Temp (24hrs), Avg:99.6 F (37.6 C), Min:99.6 F (37.6 C), Max:99.6 F (37.6 C)   Recent Labs Lab 12/16/15 1622 12/16/15 1647  WBC 7.9  --   CREATININE 1.38*  --   LATICACIDVEN  --  2.65*    Estimated Creatinine Clearance: 72.4 mL/min (by C-G formula based on Cr of 1.38).    Allergies  Allergen Reactions  . Ivp Dye [Iodinated Diagnostic Agents] Anaphylaxis    Breathing problems  . Adhesive [Tape] Rash    Rash after 2-3 days use    Antimicrobials this admission: 4/7 Zosyn >>  4/7 Vanc >>   Dose adjustments this admission:   Microbiology results: 4/7 BCx:  4/7 UCx:     Thank you for allowing pharmacy to be a part of this patient's care.  Viann Fish 12/16/2015 6:25 PM

## 2015-12-16 NOTE — ED Notes (Signed)
The pt is coming from the doctors office to r/o sepsis.  He has been ill with abd pain n v for several days.  He is diabetic rt bk amp and he had surgery on lt  Toes with little toe removal lt toes 6 weeks ago  The lt foot has been painful with some drainage for the past 2 days

## 2015-12-16 NOTE — ED Provider Notes (Signed)
CSN: [id number]     Arrival date & time 12/16/15  1551 History   First MD Initiated Contact with Patient 12/16/15 1724     Chief Complaint  Patient presents with  . Emesis   (Consider location/radiation/quality/duration/timing/severity/associated sxs/prior Treatment) Patient is a 60 y.o. male presenting with vomiting. The history is provided by the patient. No language interpreter was used.  Emesis Associated symptoms: no chills    [name] is a 60 y.o male with a history of right below the knee amputation, diabetes, hypertension, hyperlipidemia, COPD, and CHF who presents with gradual worsening nausea and vomiting 3 days. He reports that he went to his PCPs office today who sent him to the ED for possible sepsis. He had a left fifth toe amputation on 10/21/2015 by [name]. Soon after the patient developed an ulcer and was put on antibiotics but patient does not remember which one. He then removed the dressing from his surgery which also removed a scab that was below the surgical site and along the middle fifth metatarsal. The antibiotic was changed to doxycycline twice a day. Patient states the lateral side of the foot is tender and has been draining. He uses a wheelchair at baseline. He denies any fever, chills, night sweats, chest pain, shortness of breath.   Past Medical History  Diagnosis Date  . Carotid artery occlusion 11/10/10    LEFT CAROTID ENDARTERECTOMY  . Diabetes mellitus   . Hypertension   . Hyperlipidemia   . COPD (chronic obstructive pulmonary disease) ([place])   . DJD (degenerative joint disease)   . GERD (gastroesophageal reflux disease)   . Substernal chest pain   . 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 Slurred speech     AS PER WIFE IN D/C NOTE 11/10/10  . PAD (peripheral artery disease) ([place])     Distal aortogram June 2012. Atherectomy left popliteal artery July 2012.   . Barrett's esophagus   . Fatty liver   . Diverticulosis of colon  (without mention of hemorrhage)   . Emphysema of lung ([place])   . Osteomyelitis ([place])   . Complication of anesthesia     BP WENT UP AT [facility] "  . Sleep apnea     has not used CPAP in e years - took self off  . H/O hiatal hernia   . Neuromuscular disorder ([place])     peripheral neuropathy  . Full dentures   . Wears glasses   . CHF (congestive heart failure) ([place])   . History of echocardiogram 08/22/2011    Mildly hypertrophic left ventricle with normal systolic function and mild diastolic dysfunction. mildly dilated left atrium. no significant valvular abnormalities. no pericardial effusion no change from previous study 10/2010. the ejection fraction =>55%  . History of cardiovascular stress test 03/23/2011    ejection fraction measured 51%, with an end-diastolic volume of 123456 ml and an end-systolic volume of 81 ml. no evidence of myocardial ischemia.  . H/O Doppler ultrasound 06/28/2012    Lower venous duplex left-- findings consistent with acute deep vein thrombosis involving the personal vein of the left lower extremity. No evidence of Baker's cyst on the left. Duplex scan of the left lower extremity arteries reveal adequate blood flow.   . History of Holter monitoring 02/14/2012    Scan quality is fair due to baseline artifact. Predominant rhythm is Sinus. Veentricular Ectopy was noted in single beat form. The total recording time was 24:00 hours.  total beats =[id number]. the total time analyzed was 23:58 hours. Heart  Max = 102 at 1:18:22, Min= 52 at 9:56:19 am., Avg= 70. There were 0 pause(s) > 2.5 seconds. the longest pause = 0.00 sec. at 1:51 pm(1)  . OSA (obstructive sleep apnea) 12/26/08    AHI of 6.3/hr overall, 32.7/hr during REM sleep and an RDI of 24.8/hr and 50.9/hr.   Past Surgical History  Procedure Laterality Date  . Laminectomy      X 3 LUMBAR AND X 2 CERVICAL SPINE OPERATIONS  . Carotid endarterectomy  11/10/10  . Hernia repair      LEFT INGUINAL AND UMBILICAL REPAIRS  . Anterior  fusion cervical spine  02/06/06    C4-5, C5-6, C6-7; SURGEON DR. MAX COHEN  . Penile prosthesis implant  08/14/05    INFRAPUBIC INSERTION OF INFLATABLE PENILE PROSTHESIS; SURGEON DR. Amalia Hailey  . Laparoscopic cholecystectomy w/ cholangiography  11/09/04    SURGEON DR. Luella Cook  . Total knee arthroplasty  07/2002    RIGHT KNEE ; SURGEON  DR. [name] ALSO HAD ARTHROSCOPIC RIGHT KNEE IN  10/2001  . Shoulder arthroscopy    . Cholecystectomy    . Amputation  11/05/2011    Procedure: AMPUTATION RAY;  Surgeon: Wylene Simmer, MD;  Location: [place];  Service: Orthopedics;  Laterality: Right;  Amputation of Right 4&5th Toes  . Femoral artery stent      x6  . Amputation Left 11/26/2012    Procedure: AMPUTATION RAY;  Surgeon: Wylene Simmer, MD;  Location: [place];  Service: Orthopedics;  Laterality: Left;  fourth ray amputation  . Cardiac catheterization  10/31/04    2009  . Carpal tunnel release Right 10/21/2013    Procedure: RIGHT CARPAL TUNNEL RELEASE;  Surgeon: Wynonia Sours, MD;  Location: [place];  Service: Orthopedics;  Laterality: Right;  . Ulnar nerve transposition Right 10/21/2013    Procedure: RIGHT ELBOW  ULNAR NERVE DECOMPRESSION;  Surgeon: Wynonia Sours, MD;  Location: [place];  Service: Orthopedics;  Laterality: Right;  . Joint replacement Right 2001    Total  . Spine surgery    . Neck surgery    . Esophageal manometry Bilateral 07/19/2014    Procedure: ESOPHAGEAL MANOMETRY (EM);  Surgeon: Jerene Bears, MD;  Location: [facility];  Service: Gastroenterology;  Laterality: Bilateral;  . Lower extremity angiogram N/A 03/19/2012    Procedure: LOWER EXTREMITY ANGIOGRAM;  Surgeon: Burnell Blanks, MD;  Location: [facility] CATH LAB;  Service: Cardiovascular;  Laterality: N/A;  . Amputation Right 08/27/2014    Procedure: Transmetatarsal Amputation;  Surgeon: Newt Minion, MD;  Location: [place];  Service: Orthopedics;  Laterality: Right;  . Left heart catheterization with  coronary angiogram N/A 10/29/2014    Procedure: LEFT HEART CATHETERIZATION WITH CORONARY ANGIOGRAM;  Surgeon: Laverda Page, MD;  Location: [facility] CATH LAB;  Service: Cardiovascular;  Laterality: N/A;  . Percutaneous coronary stent intervention (pci-s) Right 10/29/2014    Procedure: PERCUTANEOUS CORONARY STENT INTERVENTION (PCI-S);  Surgeon: Laverda Page, MD;  Location: [facility] CATH LAB;  Service: Cardiovascular;  Laterality: Right;  . Amputation Right 01/14/2015    Procedure: AMPUTATION BELOW KNEE;  Surgeon: Newt Minion, MD;  Location: [place];  Service: Orthopedics;  Laterality: Right;  . Carotid endarterectomy Left 11/10/2010    Subtotal occlusion of left internal carotid artery with left hemispheric transient ischemic attacks.  . Tonsillectomy    . Colonoscopy    . Amputation Left 10/21/2015  Procedure: Left Foot 5th Ray Amputation;  Surgeon: Newt Minion, MD;  Location: [place];  Service: Orthopedics;  Laterality: Left;   Family History  Problem Relation Age of Onset  . Heart disease Father     Before age 50-  CAD, BPG  . Diabetes Father     Amputation  . Cancer Father     PROSTATE  . Hyperlipidemia Father   . Hypertension Father   . Heart attack Father     Triple BPG  . Varicose Veins Father   . Arthritis      GRANDMOTHER  . Hypertension      OTHER FAMILY MEMBERS  . Colon cancer Brother   . Cancer Brother     Colon  . Diabetes Brother   . Heart disease Brother 75    A-Fib. Before age 63  . Hyperlipidemia Brother   . Hypertension Brother   . Cancer Sister     Breast  . Hyperlipidemia Sister   . Hypertension Sister   . Hypertension Son    Social History  Substance Use Topics  . Smoking status: Former Smoker -- 0.50 packs/day for 35 years    Types: E-cigarettes    Quit date: 10/28/2011  . Smokeless tobacco: Never Used     Comment: VAPOR CIGARETTES  . Alcohol Use: 0.0 oz/week    0 Standard drinks or equivalent per week     Comment: rare    Review of Systems   Constitutional: Negative for fever and chills.  Gastrointestinal: Positive for vomiting.  Skin: Positive for wound.  All other systems reviewed and are negative.     Allergies  Ivp dye and Adhesive  Home Medications   Prior to Admission medications   Medication Sig Start Date End Date Taking? Authorizing Provider  carvedilol (COREG) 6.25 MG tablet Take 6.25 mg by mouth at bedtime.   Yes Historical Provider, MD  torsemide (DEMADEX) 20 MG tablet Take 20 mg by mouth daily.   Yes Historical Provider, MD  ALPRAZolam (XANAX) 0.25 MG tablet Take 0.25 mg by mouth daily. 08/22/15   Historical Provider, MD  aspirin EC 81 MG tablet Take 81 mg by mouth daily.    Historical Provider, MD  atorvastatin (LIPITOR) 40 MG tablet Take 40 mg by mouth daily.    Historical Provider, MD  carvedilol (COREG) 25 MG tablet Take 25 mg by mouth daily. 08/18/15   Historical Provider, MD  clobetasol (TEMOVATE) 0.05 % external solution Apply 1 application topically daily as needed (dry scalp).    Historical Provider, MD  clopidogrel (PLAVIX) 75 MG tablet Take 75 mg by mouth daily.  03/28/12   Historical Provider, MD  clotrimazole-betamethasone (LOTRISONE) cream Apply 1 application topically daily as needed (dry skin).    Historical Provider, MD  collagenase (SANTYL) ointment Apply topically daily. 09/06/15   Lavina Hamman, MD  doxycycline (VIBRA-TABS) 100 MG tablet Take 1 tablet (100 mg total) by mouth 2 (two) times daily. 10/12/15   Campbell Riches, MD  eplerenone (INSPRA) 50 MG tablet Take 50 mg by mouth daily. 08/18/15   Historical Provider, MD  ezetimibe (ZETIA) 10 MG tablet Take 10 mg by mouth daily.    Historical Provider, MD  feeding supplement, GLUCERNA SHAKE, (GLUCERNA SHAKE) LIQD Take 237 mLs by mouth 2 (two) times daily between meals. 09/06/15   Lavina Hamman, MD  fentaNYL (DURAGESIC - DOSED MCG/HR) 50 MCG/HR Place 1 patch (50 mcg total) onto the skin every 3 (three) days. Patient taking  differently: Place  50 mcg onto the skin every 3 (three) days. Change patch every three days 01/20/15   Florencia Reasons, MD  fluticasone (CUTIVATE) 0.05 % cream Apply 1 application topically 2 (two) times daily as needed.    Historical Provider, MD  furosemide (LASIX) 80 MG tablet Take 0.5 tablets (40 mg total) by mouth daily. Patient taking differently: Take 80 mg by mouth daily.  09/06/15   Lavina Hamman, MD  glyBURIDE-metformin (GLUCOVANCE) 2.5-500 MG per tablet Take 2 tablets by mouth 2 (two) times daily with a meal.  03/05/12   Historical Provider, MD  HYDROmorphone (DILAUDID) 2 MG tablet Take 1 tablet (2 mg total) by mouth every 4 (four) hours as needed. Patient taking differently: Take 2 mg by mouth at bedtime as needed for moderate pain.  01/17/15   Shanker Kristeen Mans, MD  insulin glargine (LANTUS) 100 unit/mL SOPN Inject 40 Units into the skin at bedtime.    Historical Provider, MD  Insulin Glulisine (APIDRA) 100 UNIT/ML Solostar Pen Inject 10 Units into the skin 3 (three) times daily with meals.    Historical Provider, MD  isosorbide mononitrate (IMDUR) 120 MG 24 hr tablet Take 120 mg by mouth daily.    Historical Provider, MD  Linaclotide Rolan Lipa) 145 MCG CAPS capsule Take 1 capsule (145 mcg total) by mouth daily as needed. Patient taking differently: Take 145 mcg by mouth daily as needed (constipation).  01/20/15   Florencia Reasons, MD  meloxicam (MOBIC) 15 MG tablet Take 15 mg by mouth daily. 07/12/15   Historical Provider, MD  Multiple Vitamin (MULTIVITAMIN WITH MINERALS) TABS tablet Take 1 tablet by mouth daily.    Historical Provider, MD  naproxen sodium (ALEVE) 220 MG tablet Take 220 mg by mouth 2 (two) times daily with a meal.    Historical Provider, MD  nitroGLYCERIN (NITRO-BID) 2 % ointment Apply topically daily as needed (esophogeal spasms).    Historical Provider, MD  nitroGLYCERIN (NITROSTAT) 0.4 MG SL tablet Place 1 tablet (0.4 mg total) under the tongue every 5 (five) minutes as needed (as needed for esophageal  spasm). Patient taking differently: Place 0.4 mg under the tongue every 5 (five) minutes as needed for chest pain.  08/09/14   Jerene Bears, MD  olmesartan-hydrochlorothiazide (BENICAR HCT) 40-25 MG tablet Take 1 tablet by mouth at bedtime. 08/01/15   Historical Provider, MD  omega-3 acid ethyl esters (LOVAZA) 1 G capsule Take 2 g by mouth 2 (two) times daily.    Historical Provider, MD  ondansetron (ZOFRAN-ODT) 4 MG disintegrating tablet Take 4 mg by mouth 4 (four) times daily as needed for nausea.    Historical Provider, MD  oxyCODONE-acetaminophen (PERCOCET/ROXICET) 5-325 MG per tablet Take 1 tablet by mouth every 8 (eight) hours as needed. Patient taking differently: Take 1 tablet by mouth every 8 (eight) hours. scheduled 09/30/14   Thurnell Lose, MD  pantoprazole (PROTONIX) 40 MG tablet Take 1 tablet (40 mg total) by mouth daily before breakfast. 10/30/14   Adrian Prows, MD  Polyethyl Glycol-Propyl Glycol (SYSTANE ULTRA OP) Place 1 drop into both eyes as needed (for dry eyes).    Historical Provider, MD  pregabalin (LYRICA) 100 MG capsule Take 100 mg by mouth 3 (three) times daily.    Historical Provider, MD  Probiotic Product (PROBIOTIC DAILY PO) Take 1 capsule by mouth 2 (two) times daily.    Historical Provider, MD  rOPINIRole (REQUIP) 2 MG tablet Take 2 mg by mouth at bedtime.  Historical Provider, MD  Tamsulosin HCl (FLOMAX) 0.4 MG CAPS Take 0.4 mg by mouth daily.    Historical Provider, MD   BP 181/74 mmHg  Pulse 63  Temp(Src) 98.5 F (36.9 C) (Oral)  Resp 19  Ht 6\' 2"  (1.88 m)  Wt 104.356 kg  BMI 29.53 kg/m2  SpO2 99% Physical Exam  Constitutional: He is oriented to person, place, and time. He appears well-developed and well-nourished. No distress.  HENT:  Head: Normocephalic and atraumatic.  Eyes: Conjunctivae are normal.  Neck: Normal range of motion. Neck supple.  Cardiovascular: Normal rate, regular rhythm and normal heart sounds.   Pulmonary/Chest: Effort normal and  breath sounds normal. No respiratory distress.  Lungs clear to auscultation bilaterally.  Abdominal: Soft. He exhibits no distension.  Musculoskeletal: Normal range of motion.  Neurological: He is alert and oriented to person, place, and time.  Skin: Skin is warm and dry.  Ulceration along the left middle fifth metatarsal with surrounding erythema and tenderness. 1+ DP pulse. No active drainage.  Below the knee amputation on right.  Psychiatric: He has a normal mood and affect.  Nursing note and vitals reviewed.   ED Course  Procedures (including critical care time) Labs Review Labs Reviewed  COMPREHENSIVE METABOLIC PANEL - Abnormal; Notable for the following:    Glucose, Bld 287 (*)    Creatinine, Ser 1.38 (*)    ALT 13 (*)    GFR calc non Af Amer 54 (*)    All other components within normal limits  CBC WITH DIFFERENTIAL/PLATELET - Abnormal; Notable for the following:    RBC 3.89 (*)    Hemoglobin 11.0 (*)    HCT 32.1 (*)    All other components within normal limits  URINALYSIS, ROUTINE W REFLEX MICROSCOPIC (NOT AT [facility]) - Abnormal; Notable for the following:    Color, Urine AMBER (*)    Specific Gravity, Urine 1.040 (*)    Glucose, UA >1000 (*)    Hgb urine dipstick SMALL (*)    Bilirubin Urine SMALL (*)    Ketones, ur 15 (*)    Protein, ur 100 (*)    All other components within normal limits  URINE MICROSCOPIC-ADD ON - Abnormal; Notable for the following:    Squamous Epithelial / LPF 0-5 (*)    Bacteria, UA RARE (*)    Casts HYALINE CASTS (*)    All other components within normal limits  CBC WITH DIFFERENTIAL/PLATELET - Abnormal; Notable for the following:    RBC 3.41 (*)    Hemoglobin 9.7 (*)    HCT 28.1 (*)    All other components within normal limits  GLUCOSE, CAPILLARY - Abnormal; Notable for the following:    Glucose-Capillary 175 (*)    All other components within normal limits  I-STAT CG4 LACTIC ACID, ED - Abnormal; Notable for the following:    Lactic  Acid, Venous 2.65 (*)    All other components within normal limits  CBG MONITORING, ED - Abnormal; Notable for the following:    Glucose-Capillary 284 (*)    All other components within normal limits  CULTURE, BLOOD (ROUTINE X 2)  CULTURE, BLOOD (ROUTINE X 2)  URINE CULTURE  HEMOGLOBIN A1C  HIV ANTIBODY (ROUTINE TESTING)  SEDIMENTATION RATE  C-REACTIVE PROTEIN  PREALBUMIN  CBC WITH DIFFERENTIAL/PLATELET  COMPREHENSIVE METABOLIC PANEL  LACTIC ACID, PLASMA  LACTIC ACID, PLASMA  PROCALCITONIN  PROTIME-INR  APTT  CBC WITH DIFFERENTIAL/PLATELET  COMPREHENSIVE METABOLIC PANEL  HEMOGLOBIN A1C  I-STAT BETA HCG BLOOD,  ED ([facility], [facility], [facility] ONLY)  I-STAT CG4 LACTIC ACID, ED  I-STAT CG4 LACTIC ACID, ED  I-STAT CG4 LACTIC ACID, ED    Imaging Review Dg Chest 2 View  12/16/2015  CLINICAL DATA:  Diabetes and left foot infection.  Sepsis. EXAM: CHEST  2 VIEW COMPARISON:  05/31/2015 FINDINGS: The heart size and mediastinal contours are within normal limits. Mild scarring again seen in left lung base. No evidence of pulmonary infiltrate or edema. No evidence of pleural effusion. Cervical spine fusion hardware again noted. IMPRESSION: Stable mild left basilar scarring.  No active lung disease. Electronically Signed   By: Earle Gell M.D.   On: 12/16/2015 16:51   Dg Foot Complete Left  12/16/2015  CLINICAL DATA:  Lateral left foot pain. No known injuries. Prior amputation of the 4th and 5th rays. Current history of diabetes. EXAM: LEFT FOOT - COMPLETE 3+ VIEW COMPARISON:  09/18/2015 and earlier, including left foot MRI 09/03/2015. FINDINGS: No evidence of acute or subacute fracture or dislocation. Interval transmetatarsal amputation of the 5th ray since the most recent prior examination. Prior amputation of the 4th ray at the level of the MCP joint. Mild osseous demineralization. Narrowing of the IP joint spaces of the remaining toes, mild in degree. Narrowing of the joint spaces in the midfoot, unchanged.  Small plantar calcaneal spur. IMPRESSION: 1. No acute osseous abnormality. 2. Mild osteoarthritis. 3. Mild osseous demineralization. Electronically Signed   By: Evangeline Dakin M.D.   On: 12/16/2015 18:49   I have personally reviewed and evaluated these images and lab results as part of my medical decision-making.   EKG Interpretation None      MDM   Final diagnoses:  Anemia, unspecified anemia type  Cellulitis of left foot   Patient presents for left foot infection from primary care physician's office. He was sent to the ED with possible osteomyelitis and sepsis. Patient had left fifth toe amputation on 10/21/2015 by [name] after diagnosis of osteomyelitis abscess. Patient is stable with normal vitals. Patient had a lactic acid of 2.65. Fluids were ordered. He is also dehydrated. Patient is known diabetic. He has no osteomyelitis of the left foot but there is obvious infection with redness, warmth, and tenderness along the fifth metatarsal. He was started on antibiotics and admitted to try at hospitalist, Dr. Olevia Bowens. I spoke to him while in the ED and he agrees to admit to MedSurg.   Ottie Glazier, PA-C 12/16/15 2311  Charlesetta Shanks, MD 12/23/15 [phone]

## 2015-12-16 NOTE — H&P (Signed)
[facility] History and Physical  [name] [id number] DOB: 09/10/1956 DOA: 12/16/2015  Referring physician: Ottie Glazier, PA-C. PCP: [name], MD   Chief Complaint: Vomiting.  HPI: [name] is a 60 y.o. male  with a past medical history of diabetes, right BKA, peripheral neuropathy, Barrett's esophagus, sleep apnea not on CPAP, CAD hypertension, hyperlipidemia, COPD who is referred from Dr. Jess Barters office due to vomiting.  The patient states that he has been having epigastric abdominal pain, nausea and emesis for about 4 days. He says that on the first day he had 5 episodes of emesis, on the second 8 on the third 5 and today he has had 3 so far. He complains of chills, fatigue, malaise, but denies fever. He has surgery on the left foot about 2 months ago, but states he has had some trouble healing. He he is states that he has had significant pain on his left foot wounds for the past 2 days. So he went today to see his orthopedic surgeon who referred him to the emergency department for further evaluation and treatment.   Review of Systems:  Constitutional: Positive  chills, fatigue.  No weight loss, night sweats, Fevers HEENT:  No headaches, Difficulty swallowing,Tooth/dental problems,Sore throat,  No sneezing, itching, ear ache, nasal congestion, post nasal drip,  Cardio-vascular:  No chest pain, Orthopnea, PND, swelling in lower extremities, anasarca, dizziness, palpitations  GI:  Positive abdominal pain, nausea, vomiting, diarrhea, change in bowel habits, loss of appetite  Resp:  No shortness of breath with exertion or at rest. No excess mucus, no productive cough, No non-productive cough, No coughing up of blood.No change in color of mucus.No wheezing.No chest wall deformity  Skin:  no rash or lesions.  GU:  no dysuria, change in color of urine, no urgency or frequency. No flank pain.  Musculoskeletal:  Left foot wounds.  Psych:  No change in  mood or affect. No depression or anxiety. No memory loss.   Past Medical History  Diagnosis Date  . Carotid artery occlusion 11/10/10    LEFT CAROTID ENDARTERECTOMY  . Diabetes mellitus   . Hypertension   . Hyperlipidemia   . COPD (chronic obstructive pulmonary disease) ([place])   . DJD (degenerative joint disease)   . GERD (gastroesophageal reflux disease)   . Substernal chest pain   . 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 Slurred speech     AS PER WIFE IN D/C NOTE 11/10/10  . PAD (peripheral artery disease) ([place])     Distal aortogram June 2012. Atherectomy left popliteal artery July 2012.   . Barrett's esophagus   . Fatty liver   . Diverticulosis of colon (without mention of hemorrhage)   . Emphysema of lung ([place])   . Osteomyelitis ([place])   . Complication of anesthesia     BP WENT UP AT [facility] "  . Sleep apnea     has not used CPAP in e years - took self off  . H/O hiatal hernia   . Neuromuscular disorder ([place])     peripheral neuropathy  . Full dentures   . Wears glasses   . CHF (congestive heart failure) ([place])   . History of echocardiogram 08/22/2011    Mildly hypertrophic left ventricle with normal systolic function and mild diastolic dysfunction. mildly dilated left atrium. no significant valvular abnormalities. no pericardial effusion no change from previous study 10/2010. the ejection fraction =>55%  . History  of cardiovascular stress test 03/23/2011    ejection fraction measured 51%, with an end-diastolic volume of 123456 ml and an end-systolic volume of 81 ml. no evidence of myocardial ischemia.  . H/O Doppler ultrasound 06/28/2012    Lower venous duplex left-- findings consistent with acute deep vein thrombosis involving the personal vein of the left lower extremity. No evidence of Baker's cyst on the left. Duplex scan of the left lower extremity arteries reveal adequate blood flow.   . History of Holter monitoring 02/14/2012    Scan quality is fair due to  baseline artifact. Predominant rhythm is Sinus. Veentricular Ectopy was noted in single beat form. The total recording time was 24:00 hours. total beats =[id number]. the total time analyzed was 23:58 hours. Heart  Max = 102 at 1:18:22, Min= 52 at 9:56:19 am., Avg= 70. There were 0 pause(s) > 2.5 seconds. the longest pause = 0.00 sec. at 1:51 pm(1)  . OSA (obstructive sleep apnea) 12/26/08    AHI of 6.3/hr overall, 32.7/hr during REM sleep and an RDI of 24.8/hr and 50.9/hr.   Past Surgical History  Procedure Laterality Date  . Laminectomy      X 3 LUMBAR AND X 2 CERVICAL SPINE OPERATIONS  . Carotid endarterectomy  11/10/10  . Hernia repair      LEFT INGUINAL AND UMBILICAL REPAIRS  . Anterior fusion cervical spine  02/06/06    C4-5, C5-6, C6-7; SURGEON DR. MAX COHEN  . Penile prosthesis implant  08/14/05    INFRAPUBIC INSERTION OF INFLATABLE PENILE PROSTHESIS; SURGEON DR. Amalia Hailey  . Laparoscopic cholecystectomy w/ cholangiography  11/09/04    SURGEON DR. Luella Cook  . Total knee arthroplasty  07/2002    RIGHT KNEE ; SURGEON  DR. [name] ALSO HAD ARTHROSCOPIC RIGHT KNEE IN  10/2001  . Shoulder arthroscopy    . Cholecystectomy    . Amputation  11/05/2011    Procedure: AMPUTATION RAY;  Surgeon: Wylene Simmer, MD;  Location: [place];  Service: Orthopedics;  Laterality: Right;  Amputation of Right 4&5th Toes  . Femoral artery stent      x6  . Amputation Left 11/26/2012    Procedure: AMPUTATION RAY;  Surgeon: Wylene Simmer, MD;  Location: [place];  Service: Orthopedics;  Laterality: Left;  fourth ray amputation  . Cardiac catheterization  10/31/04    2009  . Carpal tunnel release Right 10/21/2013    Procedure: RIGHT CARPAL TUNNEL RELEASE;  Surgeon: Wynonia Sours, MD;  Location: [place];  Service: Orthopedics;  Laterality: Right;  . Ulnar nerve transposition Right 10/21/2013    Procedure: RIGHT ELBOW  ULNAR NERVE DECOMPRESSION;  Surgeon: Wynonia Sours, MD;  Location: [place];   Service: Orthopedics;  Laterality: Right;  . Joint replacement Right 2001    Total  . Spine surgery    . Neck surgery    . Esophageal manometry Bilateral 07/19/2014    Procedure: ESOPHAGEAL MANOMETRY (EM);  Surgeon: Jerene Bears, MD;  Location: [facility];  Service: Gastroenterology;  Laterality: Bilateral;  . Lower extremity angiogram N/A 03/19/2012    Procedure: LOWER EXTREMITY ANGIOGRAM;  Surgeon: Burnell Blanks, MD;  [facility];  Service: Cardiovascular;  Laterality: N/A;  . Amputation Right 08/27/2014    Procedure: Transmetatarsal Amputation;  Surgeon: Newt Minion, MD;  Location: [place];  Service: Orthopedics;  Laterality: Right;  . Left heart catheterization with coronary angiogram N/A 10/29/2014    Procedure: LEFT HEART CATHETERIZATION WITH CORONARY ANGIOGRAM;  Surgeon: Laverda [place], MD;  Location: [facility] CATH LAB;  Service: Cardiovascular;  Laterality: N/A;  . Percutaneous coronary stent intervention (pci-s) Right 10/29/2014    Procedure: PERCUTANEOUS CORONARY STENT INTERVENTION (PCI-S);  Surgeon: Laverda [place], MD;  Location: [facility] CATH LAB;  Service: Cardiovascular;  Laterality: Right;  . Amputation Right 01/14/2015    Procedure: AMPUTATION BELOW KNEE;  Surgeon: Newt Minion, MD;  Location: [place];  Service: Orthopedics;  Laterality: Right;  . Carotid endarterectomy Left 11/10/2010    Subtotal occlusion of left internal carotid artery with left hemispheric transient ischemic attacks.  . Tonsillectomy    . Colonoscopy    . Amputation Left 10/21/2015    Procedure: Left Foot 5th Ray Amputation;  Surgeon: Newt Minion, MD;  Location: [place];  Service: Orthopedics;  Laterality: Left;   Social History:  reports that he quit smoking about 4 years ago. His smoking use included E-cigarettes. He has a 17.5 pack-year smoking history. He has never used smokeless tobacco. He reports that he drinks alcohol. He reports that he does not use illicit drugs.  Allergies  Allergen  Reactions  . Ivp Dye [Iodinated Diagnostic Agents] Anaphylaxis    Breathing problems  . Adhesive [Tape] Rash    Rash after 2-3 days use    Family History  Problem Relation Age of Onset  . Heart disease Father     Before age 35-  CAD, BPG  . Diabetes Father     Amputation  . Cancer Father     PROSTATE  . Hyperlipidemia Father   . Hypertension Father   . Heart attack Father     Triple BPG  . Varicose Veins Father   . Arthritis      GRANDMOTHER  . Hypertension      OTHER FAMILY MEMBERS  . Colon cancer Brother   . Cancer Brother     Colon  . Diabetes Brother   . Heart disease Brother 14    A-Fib. Before age 83  . Hyperlipidemia Brother   . Hypertension Brother   . Cancer Sister     Breast  . Hyperlipidemia Sister   . Hypertension Sister   . Hypertension Son     Prior to Admission medications   Medication Sig Start Date End Date Taking? Authorizing Provider  ALPRAZolam (XANAX) 0.25 MG tablet Take 0.25 mg by mouth daily. 08/22/15   Historical Provider, MD  aspirin EC 81 MG tablet Take 81 mg by mouth daily.    Historical Provider, MD  atorvastatin (LIPITOR) 40 MG tablet Take 40 mg by mouth daily.    Historical Provider, MD  carvedilol (COREG) 25 MG tablet Take 25 mg by mouth daily. 08/18/15   Historical Provider, MD  clobetasol (TEMOVATE) 0.05 % external solution Apply 1 application topically daily as needed (dry scalp).    Historical Provider, MD  clopidogrel (PLAVIX) 75 MG tablet Take 75 mg by mouth daily.  03/28/12   Historical Provider, MD  clotrimazole-betamethasone (LOTRISONE) cream Apply 1 application topically daily as needed (dry skin).    Historical Provider, MD  collagenase (SANTYL) ointment Apply topically daily. 09/06/15   Lavina Hamman, MD  doxycycline (VIBRA-TABS) 100 MG tablet Take 1 tablet (100 mg total) by mouth 2 (two) times daily. 10/12/15   Campbell Riches, MD  eplerenone (INSPRA) 50 MG tablet Take 50 mg by mouth daily. 08/18/15   Historical Provider, MD    ezetimibe (ZETIA) 10 MG tablet Take 10 mg by mouth daily.  Historical Provider, MD  feeding supplement, GLUCERNA SHAKE, (GLUCERNA SHAKE) LIQD Take 237 mLs by mouth 2 (two) times daily between meals. 09/06/15   Lavina Hamman, MD  fentaNYL (DURAGESIC - DOSED MCG/HR) 50 MCG/HR Place 1 patch (50 mcg total) onto the skin every 3 (three) days. Patient taking differently: Place 50 mcg onto the skin every 3 (three) days. Change patch every three days 01/20/15   Florencia Reasons, MD  fluticasone (CUTIVATE) 0.05 % cream Apply 1 application topically 2 (two) times daily as needed.    Historical Provider, MD  furosemide (LASIX) 80 MG tablet Take 0.5 tablets (40 mg total) by mouth daily. Patient taking differently: Take 80 mg by mouth daily.  09/06/15   Lavina Hamman, MD  glyBURIDE-metformin (GLUCOVANCE) 2.5-500 MG per tablet Take 2 tablets by mouth 2 (two) times daily with a meal.  03/05/12   Historical Provider, MD  HYDROmorphone (DILAUDID) 2 MG tablet Take 1 tablet (2 mg total) by mouth every 4 (four) hours as needed. Patient taking differently: Take 2 mg by mouth at bedtime as needed for moderate pain.  01/17/15   Shanker Kristeen Mans, MD  insulin glargine (LANTUS) 100 unit/mL SOPN Inject 40 Units into the skin at bedtime.    Historical Provider, MD  Insulin Glulisine (APIDRA) 100 UNIT/ML Solostar Pen Inject 10 Units into the skin 3 (three) times daily with meals.    Historical Provider, MD  isosorbide mononitrate (IMDUR) 120 MG 24 hr tablet Take 120 mg by mouth daily.    Historical Provider, MD  Linaclotide Rolan Lipa) 145 MCG CAPS capsule Take 1 capsule (145 mcg total) by mouth daily as needed. Patient taking differently: Take 145 mcg by mouth daily as needed (constipation).  01/20/15   Florencia Reasons, MD  meloxicam (MOBIC) 15 MG tablet Take 15 mg by mouth daily. 07/12/15   Historical Provider, MD  Multiple Vitamin (MULTIVITAMIN WITH MINERALS) TABS tablet Take 1 tablet by mouth daily.    Historical Provider, MD  naproxen  sodium (ALEVE) 220 MG tablet Take 220 mg by mouth 2 (two) times daily with a meal.    Historical Provider, MD  nitroGLYCERIN (NITRO-BID) 2 % ointment Apply topically daily as needed (esophogeal spasms).    Historical Provider, MD  nitroGLYCERIN (NITROSTAT) 0.4 MG SL tablet Place 1 tablet (0.4 mg total) under the tongue every 5 (five) minutes as needed (as needed for esophageal spasm). Patient taking differently: Place 0.4 mg under the tongue every 5 (five) minutes as needed for chest pain.  08/09/14   Jerene Bears, MD  olmesartan-hydrochlorothiazide (BENICAR HCT) 40-25 MG tablet Take 1 tablet by mouth at bedtime. 08/01/15   Historical Provider, MD  omega-3 acid ethyl esters (LOVAZA) 1 G capsule Take 2 g by mouth 2 (two) times daily.    Historical Provider, MD  ondansetron (ZOFRAN-ODT) 4 MG disintegrating tablet Take 4 mg by mouth 4 (four) times daily as needed for nausea.    Historical Provider, MD  oxyCODONE-acetaminophen (PERCOCET/ROXICET) 5-325 MG per tablet Take 1 tablet by mouth every 8 (eight) hours as needed. Patient taking differently: Take 1 tablet by mouth every 8 (eight) hours. scheduled 09/30/14   Thurnell Lose, MD  pantoprazole (PROTONIX) 40 MG tablet Take 1 tablet (40 mg total) by mouth daily before breakfast. 10/30/14   Adrian Prows, MD  Polyethyl Glycol-Propyl Glycol (SYSTANE ULTRA OP) Place 1 drop into both eyes as needed (for dry eyes).    Historical Provider, MD  pregabalin (LYRICA) 100 MG capsule Take  100 mg by mouth 3 (three) times daily.    Historical Provider, MD  Probiotic Product (PROBIOTIC DAILY PO) Take 1 capsule by mouth 2 (two) times daily.    Historical Provider, MD  rOPINIRole (REQUIP) 2 MG tablet Take 2 mg by mouth at bedtime.     Historical Provider, MD  Tamsulosin HCl (FLOMAX) 0.4 MG CAPS Take 0.4 mg by mouth daily.    Historical Provider, MD   Physical Exam: Filed Vitals:   12/16/15 1624 12/16/15 1730 12/16/15 1800 12/16/15 1815  BP:  181/87 160/66 152/73  Pulse:   83 66 69  Temp:      TempSrc:      Resp:      Height:      Weight: 104.356 kg (230 lb 1 oz)     SpO2:  99% 98% 97%    Wt Readings from Last 3 Encounters:  12/16/15 104.356 kg (230 lb 1 oz)  10/21/15 113.399 kg (250 lb)  09/18/15 113.399 kg (250 lb)    General:  Appears calm and comfortable Eyes: PERRL, normal lids, irises & conjunctiva ENT: grossly normal hearing, lips & tongue. Oral mucosa was dry. Neck: no LAD, masses or thyromegaly Cardiovascular: RRR, no m/r/g. No LE edema. Telemetry: SR, no arrhythmias  Respiratory: CTA bilaterally, no w/r/r. Normal respiratory effort. Abdomen: soft, ntnd Skin: The patient has a small dorsal aspect wound and a bigger lateral lesion on his left foot with surrounding erythema and edema. Positive tenderness on the lateral wound. No discharge noticed at this time. Musculoskeletal: Right BKA. Psychiatric: grossly normal mood and affect, speech fluent and appropriate Neurologic: grossly non-focal.          Labs on Admission:  Basic Metabolic Panel:  Recent Labs Lab 12/16/15 1622  NA 139  K 4.0  CL 102  CO2 25  GLUCOSE 287*  BUN 15  CREATININE 1.38*  CALCIUM 9.2   Liver Function Tests:  Recent Labs Lab 12/16/15 1622  AST 19  ALT 13*  ALKPHOS 103  BILITOT 0.8  PROT 6.5  ALBUMIN 3.7   CBC:  Recent Labs Lab 12/16/15 1622  WBC 7.9  NEUTROABS 5.8  HGB 11.0*  HCT 32.1*  MCV 82.5  PLT 222    CBG:  Recent Labs Lab 12/16/15 1629  GLUCAP 284*    Radiological Exams on Admission: Dg Chest 2 View  12/16/2015  CLINICAL DATA:  Diabetes and left foot infection.  Sepsis. EXAM: CHEST  2 VIEW COMPARISON:  05/31/2015 FINDINGS: The heart size and mediastinal contours are within normal limits. Mild scarring again seen in left lung base. No evidence of pulmonary infiltrate or edema. No evidence of pleural effusion. Cervical spine fusion hardware again noted. IMPRESSION: Stable mild left basilar scarring.  No active lung disease.  Electronically Signed   By: Earle Gell M.D.   On: 12/16/2015 16:51   Dg Foot Complete Left  12/16/2015  CLINICAL DATA:  Lateral left foot pain. No known injuries. Prior amputation of the 4th and 5th rays. Current history of diabetes. EXAM: LEFT FOOT - COMPLETE 3+ VIEW COMPARISON:  09/18/2015 and earlier, including left foot MRI 09/03/2015. FINDINGS: No evidence of acute or subacute fracture or dislocation. Interval transmetatarsal amputation of the 5th ray since the most recent prior examination. Prior amputation of the 4th ray at the level of the MCP joint. Mild osseous demineralization. Narrowing of the IP joint spaces of the remaining toes, mild in degree. Narrowing of the joint spaces in the midfoot, unchanged. Small  plantar calcaneal spur. IMPRESSION: 1. No acute osseous abnormality. 2. Mild osteoarthritis. 3. Mild osseous demineralization. Electronically Signed   By: Evangeline Dakin M.D.   On: 12/16/2015 18:49     Assessment/Plan Principal Problem:   Diabetic foot ulcer ([place])  Cellulitis of left foot Admit to MedSurg. Analgesics as needed. Antiemetics as needed. Continue vancomycin and Zosyn per pharmacy. Consider consulting orthopedic surgery if no improvement.  Active Problems:   Type 2 diabetes mellitus with insulin therapy (HCC) Continue glyburide and metformin. Continue Lantus 40 units at bedtime. CBG monitoring with regular insulin sliding scale.    HTN (hypertension) Continue current antihypertensive therapy with Benicar HCT and monitor blood pressure.    GERD (gastroesophageal reflux disease)   Barrett's esophagus without dysplasia Continue pantoprazole 40 mg by mouth daily. Surveillance EGDs as per gastroenterology.    Hyperlipidemia Continue atorvastatin and Lovaza. Monitor LFTs periodically.      CAD (dz of distal, mid and proximal RCA with implantation of 3 overlapping drug-eluting stent,) Continue antiplatelet therapy and beta blocker.    Chronic diastolic  heart failure, NYHA class 2 (HCC) Continue isosorbide mononitrate 120 mg every 24 hours. Continue Benicar HCT and carvedilol.     Code Status: Full code. DVT Prophylaxis: Lovenox SQ. Family Communication:  Disposition Plan: Admit for further evaluation and treatment.  Time spent: Over 70 minutes were used during the process of his admission.  Reubin Milan, M.D. [facility] Pager [phone].

## 2015-12-16 NOTE — ED Notes (Signed)
Pt discussed with  Dr Johnney Killian edp

## 2015-12-17 DIAGNOSIS — L97519 Non-pressure chronic ulcer of other part of right foot with unspecified severity: Secondary | ICD-10-CM

## 2015-12-17 DIAGNOSIS — Z794 Long term (current) use of insulin: Secondary | ICD-10-CM

## 2015-12-17 DIAGNOSIS — E119 Type 2 diabetes mellitus without complications: Secondary | ICD-10-CM

## 2015-12-17 DIAGNOSIS — L03116 Cellulitis of left lower limb: Principal | ICD-10-CM

## 2015-12-17 DIAGNOSIS — E08621 Diabetes mellitus due to underlying condition with foot ulcer: Secondary | ICD-10-CM

## 2015-12-17 LAB — GLUCOSE, CAPILLARY
Glucose-Capillary: 153 mg/dL — ABNORMAL HIGH (ref 65–99)
Glucose-Capillary: 129 mg/dL — ABNORMAL HIGH (ref 65–99)
Glucose-Capillary: 95 mg/dL (ref 65–99)
Glucose-Capillary: 151 mg/dL — ABNORMAL HIGH (ref 65–99)

## 2015-12-17 LAB — CBC WITH DIFFERENTIAL/PLATELET
Eosinophils Relative: 2 %
HCT: 26.4 % — ABNORMAL LOW (ref 39.0–52.0)
Hemoglobin: 9.3 g/dL — ABNORMAL LOW (ref 13.0–17.0)
Lymphocytes Relative: 20 %
MCH: 29.1 pg (ref 26.0–34.0)
MCHC: 35.2 g/dL (ref 30.0–36.0)
MCV: 82.5 fL (ref 78.0–100.0)
Monocytes Relative: 8 %
Neutrophils Relative %: 70 %
Platelets: 151 10*3/uL (ref 150–400)
RBC: 3.2 MIL/uL — ABNORMAL LOW (ref 4.22–5.81)
RDW: 13.8 % (ref 11.5–15.5)
WBC: 6.1 10*3/uL (ref 4.0–10.5)

## 2015-12-17 LAB — COMPREHENSIVE METABOLIC PANEL
ALT: 12 U/L — ABNORMAL LOW (ref 17–63)
AST: 16 U/L (ref 15–41)
Albumin: 3 g/dL — ABNORMAL LOW (ref 3.5–5.0)
Alkaline Phosphatase: 83 U/L (ref 38–126)
Anion gap: 10 (ref 5–15)
BUN: 13 mg/dL (ref 6–20)
CO2: 23 mmol/L (ref 22–32)
Calcium: 8 mg/dL — ABNORMAL LOW (ref 8.9–10.3)
Chloride: 106 mmol/L (ref 101–111)
Creatinine, Ser: 1.26 mg/dL — ABNORMAL HIGH (ref 0.61–1.24)
GFR calc Af Amer: >60 mL/min (ref >60)
GFR calc non Af Amer: 60 mL/min — ABNORMAL LOW (ref >60)
Glucose, Bld: 188 mg/dL — ABNORMAL HIGH (ref 65–99)
Potassium: 3.5 mmol/L (ref 3.5–5.1)
Sodium: 139 mmol/L (ref 135–145)
Total Bilirubin: 0.8 mg/dL (ref 0.3–1.2)
Total Protein: 5.5 g/dL — ABNORMAL LOW (ref 6.5–8.1)

## 2015-12-17 LAB — LACTIC ACID, PLASMA: Lactic Acid, Venous: 1.5 mmol/L (ref 0.5–2.0)

## 2015-12-17 LAB — HIV ANTIBODY (ROUTINE TESTING W REFLEX): HIV Screen 4th Generation wRfx: NONREACTIVE

## 2015-12-17 LAB — PROCALCITONIN: Procalcitonin: <0.1 ng/mL

## 2015-12-17 LAB — SEDIMENTATION RATE: Sed Rate: 20 mm/hr — ABNORMAL HIGH (ref 0–16)

## 2015-12-17 MED ORDER — GLUCERNA SHAKE PO LIQD: 237.0000 mL | Freq: Two times a day (BID) | ORAL | Status: DC

## 2015-12-17 MED ORDER — EZETIMIBE 10 MG PO TABS
10.0000 mg | ORAL_TABLET | Freq: Every day | ORAL | Status: DC
  Administered 2015-12-17 – 2015-12-18 (×2): 10 mg via ORAL
  Filled 2015-12-17 (×2): qty 1

## 2015-12-17 MED ORDER — TORSEMIDE 20 MG PO TABS
20.0000 mg | ORAL_TABLET | Freq: Every day | ORAL | Status: DC
  Administered 2015-12-17 – 2015-12-18 (×2): 20 mg via ORAL
  Filled 2015-12-17 (×2): qty 1

## 2015-12-17 MED ORDER — PREGABALIN 100 MG PO CAPS: 100.0000 mg | ORAL_CAPSULE | Freq: Three times a day (TID) | ORAL | Status: DC

## 2015-12-17 MED ORDER — POLYETHYL GLYCOL-PROPYL GLYCOL 0.4-0.3 % OP SOLN: 1.0000 [drp] | OPHTHALMIC | Status: DC | PRN

## 2015-12-17 MED ORDER — PANTOPRAZOLE SODIUM 40 MG PO TBEC
40.0000 mg | DELAYED_RELEASE_TABLET | Freq: Every day | ORAL | Status: DC
  Administered 2015-12-17 – 2015-12-18 (×2): 40 mg via ORAL
  Filled 2015-12-17 (×2): qty 1

## 2015-12-17 MED ORDER — TAMSULOSIN HCL 0.4 MG PO CAPS
0.4000 mg | ORAL_CAPSULE | Freq: Every day | ORAL | Status: DC
  Administered 2015-12-17 – 2015-12-18 (×2): 0.4 mg via ORAL
  Filled 2015-12-17 (×2): qty 1

## 2015-12-17 MED ORDER — CLOPIDOGREL BISULFATE 75 MG PO TABS
75.0000 mg | ORAL_TABLET | Freq: Every day | ORAL | Status: DC
  Administered 2015-12-17 – 2015-12-18 (×2): 75 mg via ORAL
  Filled 2015-12-17 (×2): qty 1

## 2015-12-17 MED ORDER — SPIRONOLACTONE 25 MG PO TABS
25.0000 mg | ORAL_TABLET | Freq: Every day | ORAL | Status: DC
  Administered 2015-12-17 – 2015-12-18 (×2): 25 mg via ORAL
  Filled 2015-12-17 (×2): qty 1

## 2015-12-17 MED ORDER — CARVEDILOL 25 MG PO TABS: 25.0000 mg | ORAL_TABLET | Freq: Every day | ORAL | Status: DC

## 2015-12-17 MED ORDER — ALPRAZOLAM 0.25 MG PO TABS
0.2500 mg | ORAL_TABLET | Freq: Every day | ORAL | Status: DC
  Administered 2015-12-17 – 2015-12-18 (×2): 0.25 mg via ORAL
  Filled 2015-12-17 (×2): qty 1

## 2015-12-17 MED ORDER — ONDANSETRON 4 MG PO TBDP: 4.0000 mg | ORAL_TABLET | Freq: Four times a day (QID) | ORAL | Status: DC | PRN

## 2015-12-17 MED ORDER — GLUCERNA SHAKE PO LIQD
237.0000 mL | Freq: Three times a day (TID) | ORAL | Status: DC
  Administered 2015-12-17 – 2015-12-18 (×3): 237 mL via ORAL

## 2015-12-17 MED ORDER — OXYCODONE-ACETAMINOPHEN 5-325 MG PO TABS
1.0000 | ORAL_TABLET | Freq: Once | ORAL | Status: AC
  Administered 2015-12-17: 1 via ORAL
  Filled 2015-12-17: qty 1

## 2015-12-17 MED ORDER — ADULT MULTIVITAMIN W/MINERALS CH
1.0000 | ORAL_TABLET | Freq: Every day | ORAL | Status: DC
  Administered 2015-12-17 – 2015-12-18 (×2): 1 via ORAL
  Filled 2015-12-17 (×2): qty 1

## 2015-12-17 MED ORDER — POLYVINYL ALCOHOL 1.4 % OP SOLN
1.0000 [drp] | OPHTHALMIC | Status: DC | PRN
  Filled 2015-12-17: qty 15

## 2015-12-17 MED ORDER — SODIUM CHLORIDE 0.9 % IV BOLUS (SEPSIS)
250.0000 mL | Freq: Once | INTRAVENOUS | Status: AC
  Administered 2015-12-17: 250 mL via INTRAVENOUS

## 2015-12-17 MED ORDER — ATORVASTATIN CALCIUM 40 MG PO TABS
40.0000 mg | ORAL_TABLET | Freq: Every day | ORAL | Status: DC
  Administered 2015-12-17 – 2015-12-18 (×2): 40 mg via ORAL
  Filled 2015-12-17 (×2): qty 1

## 2015-12-17 MED ORDER — ROPINIROLE HCL 1 MG PO TABS
2.0000 mg | ORAL_TABLET | Freq: Every day | ORAL | Status: DC
  Administered 2015-12-17: 2 mg via ORAL
  Filled 2015-12-17: qty 2

## 2015-12-17 MED ORDER — LINACLOTIDE 145 MCG PO CAPS
145.0000 ug | ORAL_CAPSULE | Freq: Every day | ORAL | Status: DC | PRN
  Filled 2015-12-17: qty 1

## 2015-12-17 MED ORDER — NITROGLYCERIN 2 % TD OINT: 1.0000 [in_us] | TOPICAL_OINTMENT | Freq: Every day | TRANSDERMAL | Status: DC | PRN

## 2015-12-17 MED ORDER — CLOTRIMAZOLE 1 % EX CREA
TOPICAL_CREAM | Freq: Two times a day (BID) | CUTANEOUS | Status: DC
  Administered 2015-12-18: 12:00:00 via TOPICAL
  Filled 2015-12-17: qty 15

## 2015-12-17 MED ORDER — CARVEDILOL 6.25 MG PO TABS: 6.2500 mg | ORAL_TABLET | Freq: Every day | ORAL | Status: DC

## 2015-12-17 MED ORDER — RISAQUAD PO CAPS
ORAL_CAPSULE | Freq: Two times a day (BID) | ORAL | Status: DC
  Administered 2015-12-17 – 2015-12-18 (×2): 1 via ORAL
  Filled 2015-12-17 (×3): qty 1

## 2015-12-17 MED ORDER — CLOBETASOL PROPIONATE 0.05 % EX SOLN: 1.0000 "application " | Freq: Every day | CUTANEOUS | Status: DC | PRN

## 2015-12-17 MED ORDER — CARVEDILOL 25 MG PO TABS
25.0000 mg | ORAL_TABLET | Freq: Every day | ORAL | Status: DC
  Administered 2015-12-17: 25 mg via ORAL
  Filled 2015-12-17: qty 1

## 2015-12-17 MED ORDER — ISOSORBIDE MONONITRATE ER 60 MG PO TB24
120.0000 mg | ORAL_TABLET | Freq: Every day | ORAL | Status: DC
  Administered 2015-12-17 – 2015-12-18 (×2): 120 mg via ORAL
  Filled 2015-12-17 (×2): qty 2

## 2015-12-17 NOTE — Progress Notes (Signed)
Initial Nutrition Assessment  DOCUMENTATION CODES:  Obesity unspecified  INTERVENTION:  Glucerna Shake po TID, each supplement provides 220 kcal and 10 grams of protein  MVI with minerals  NUTRITION DIAGNOSIS:  Increased nutrient needs related to wound healing as evidenced by estimated nutritional requirements for the condition  GOAL:  Patient will meet greater than or equal to 90% of their needs  MONITOR:  PO intake, Supplement acceptance, Labs, Skin  REASON FOR ASSESSMENT:  Consult Wound healing  ASSESSMENT:  60 y/o male PMHx DM, HTN, HLD, COPD, DJD, CHF, PAD, R BKA, barrets esophagus, sleep apnea who presented with vomiting. He complains of epigastric abd pain, n/v x 4 days. Also with chills, fatigue, malaise. Admitted and worked up for cellulitis of L foot.   Pt states that since he has been admitted and started on Abx, his appetite has completely rebounded. He state he has been eating his meals well. PTA he went ~3days without eating due to severe nausea and vomiting. At home, he says he takes a mvi. He was fairly vague about whether or not he followed a low salt or diabetic diet. He states he doesn't use the salt shaker. He does use a salt substitute, potentially one with KCL  He does not eat meat. He states that, because he has dentures, it tires his jaw out too much too eat meats (w/ exception of chicken). He does eat other high protein sources such as legumes, eggs, PB. Even if his meat is chopped up, he still cannot eat it. He drinks 2 glucernas each day.   Wt wise, he was surprised to hear he had lost ~ 30 lbs. He states at his last 2 outpatient appointments he weighed 260 and 258 lbs. Today, his bed weight read 261 lbs. This would indicate the scale he was originally weighed on in ED was faulty.  Nutritionally, he appears set up well for wound healing. He has good PO intake, he has been taking a MVI and his CBGs are controlled at this time.   NFPE: WDL.  Labs reviewed:  RBC: 3.2. H/H 9.3/26.4. TotaL PRO: 5.5, Albumin: 3.0   Recent Labs Lab 12/16/15 1622 12/16/15 2221 12/17/15 0058  NA 139 141 139  K 4.0 3.6 3.5  CL 102 108 106  CO2 25 23 23   BUN 15 13 13   CREATININE 1.38* 1.25* 1.26*  CALCIUM 9.2 8.3* 8.0*  GLUCOSE 287* 221* 188*   Diet Order:  Diet heart healthy/carb modified Room service appropriate?: Yes; Fluid consistency:: Thin  Skin: Incision to Left foot, diabetic ulcer left foot, R BKA,  Last BM:  4/7  Height:  Ht Readings from Last 1 Encounters:  12/16/15 6\' 2"  (1.88 m)    Weight:  Wt Readings from Last 1 Encounters:  12/16/15 230 lb 1 oz (104.356 kg)   Wt Readings from Last 10 Encounters:  12/16/15 230 lb 1 oz (104.356 kg)  10/21/15 250 lb (113.399 kg)  09/18/15 250 lb (113.399 kg)  09/06/15 253 lb 1.6 oz (114.805 kg)  03/23/15 252 lb (114.306 kg)  02/25/15 250 lb (113.399 kg)  02/15/15 257 lb (116.574 kg)  01/28/15 257 lb (116.574 kg)  01/25/15 254 lb 9.6 oz (115.486 kg)  01/21/15 257 lb (116.574 kg)  Bed weight: 261 lbs. (118.6 kg)  Ideal Body Weight:  80.75 kg  Adjusted BMI for BKA:  Body mass index is 35.6 kg/(m^2).  Estimated Nutritional Needs:  Kcal:  1900-2150 (16-18 kcal/kg bw) Protein:  97-113 g (1.2-1.4 g/kg  bw) Fluid:  > 2 liters  EDUCATION NEEDS:  No education needs identified at this time  Burtis Junes RD, LDN Clinical Nutrition Pager: [phone] 12/17/2015 5:48 PM

## 2015-12-17 NOTE — Progress Notes (Addendum)
[facility] PROGRESS NOTE  CHRISTHIAN PREY [id number] DOB: 1956/07/15 DOA: 12/16/2015 PCP: [name], MD  60 year old obese male with history of diabetes mellitus, controlled, peripheral vascular disease status post right BKA, coronary artery disease, GERD, chronic diastolic CHF hyperlipidemia, cellulitis of the left leg presented to his PCP office initially for nausea and vomiting of several episodes for the last 3-4 days. He reported having some chills and poor appetite. No history of fevers,  abdominal pain, chest pain, shortness of breath, recent travel or sick contact. He reports having a scab on his left foot which came off recently and was having scanty serous discharge for which she was following with [name]. PCP had concerned that it was infected and sent to the ED. In the ED vitals were stable but had elevated lactic acid of 2.4. There is concern for sepsis so sepsis pathway was initiated, given IV fluid bolus and started on empiric vancomycin and Zosyn for cellulitis of his left foot.  Assessment/Plan: Left foot cellulitis Patient does not meet criteria for sepsis. Patient reports erythema over his lateral left foot to have improved since admission. Continue empiric vancomycin and Zosyn for now. Has a small superficial ulcer on his left lateral dorsum with mucopurulent discharge. Wound care consulted. Also has a superficial ulcer on the mid dorsum. Continue daily dressing. Supportive care with when necessary Tylenol. Does not have any pain.  Intractable nausea and vomiting Suspect could be related to underlying cellulitis. UA unremarkable. Blood cultures so far negative. Symptoms have resolved since admission. Supportive care with when necessary Zofran.  Type 2 diabetes mellitus Continue glyburide. Will hold metformin while in the hospital. Continue home dose Lantus. Monitor on sliding scale coverage. Last A1c of 7.5.  GERD with history of Barrett's  esophagus Continue PPI  Chronic diastolic CHF Stable. Continue Coreg, Imdur and Benicar, continue Lasix.  CAD with history of stenting, rotated endarterectomy Continue Plavix, Lipitor  BPH Continue Flomax  DVT prophylaxis: Subcutaneous Lovenox  Diet: Heart healthy/diabetic  Code Status: Full code Family Communication: None at bedside Disposition Plan: Home possibly tomorrow if continues to improve   Consultants:  None  Procedures:  None  Antibiotics:  IV vancomycin and Zosyn since 4/7  HPI/Subjective: Seen and examined. Reports redness over his left foot to be better. Further nausea or vomiting since admission.  Objective: Filed Vitals:   12/16/15 2111 12/17/15 0345  BP: 181/74 177/81  Pulse: 63 72  Temp: 98.5 F (36.9 C) 98.9 F (37.2 C)  Resp: 19 19    Intake/Output Summary (Last 24 hours) at 12/17/15 1049 Last data filed at 12/17/15 0657  Gross per 24 hour  Intake    240 ml  Output    300 ml  Net    -60 ml   Filed Weights   12/16/15 1557 12/16/15 1624  Weight: 117.935 kg (260 lb) 104.356 kg (230 lb 1 oz)    Exam:   General:  Middle aged obese male not in distress  HEENT: No pallor, moist mucosa  Chest: Clear bilaterally  CVS: Normal S1 and S2, no murmurs  GI: Soft, nondistended, nontender, bowel sounds present  Musculoskeletal: Warm, right BKA, left foot cellulitis involving lateral dorsum with a 0.5 cm superficial ulcer with scanty mucopurulent discharge, nontender, small superficial ulcer involving the mid dorsum. Status post left fifth toe amputation  Data Reviewed: Basic Metabolic Panel:  Recent Labs Lab 12/16/15 1622 12/16/15 2221 12/17/15 0058  NA 139 141 139  K 4.0 3.6 3.5  CL 102 108 106  CO2 25 23 23   GLUCOSE 287* 221* 188*  BUN 15 13 13   CREATININE 1.38* 1.25* 1.26*  CALCIUM 9.2 8.3* 8.0*   Liver Function Tests:  Recent Labs Lab 12/16/15 1622 12/16/15 2221 12/17/15 0058  AST 19 18 16   ALT 13* 12* 12*   ALKPHOS 103 90 83  BILITOT 0.8 0.9 0.8  PROT 6.5 5.7* 5.5*  ALBUMIN 3.7 3.2* 3.0*   No results for input(s): LIPASE, AMYLASE in the last 168 hours. No results for input(s): AMMONIA in the last 168 hours. CBC:  Recent Labs Lab 12/16/15 1622 12/16/15 2221 12/17/15 0058  WBC 7.9 6.3 6.1  NEUTROABS 5.8 3.9  --   HGB 11.0* 9.7* 9.3*  HCT 32.1* 28.1* 26.4*  MCV 82.5 82.4 82.5  PLT 222 161 151   Cardiac Enzymes: No results for input(s): CKTOTAL, CKMB, CKMBINDEX, TROPONINI in the last 168 hours. BNP (last 3 results) No results for input(s): BNP in the last 8760 hours.  ProBNP (last 3 results) No results for input(s): PROBNP in the last 8760 hours.  CBG:  Recent Labs Lab 12/16/15 1629 12/16/15 2215 12/17/15 0646  GLUCAP 284* 175* 153*    Recent Results (from the past 240 hour(s))  Culture, blood (Routine x 2)     Status: None (Preliminary result)   Collection Time: 12/16/15  4:22 PM  Result Value Ref Range Status   Specimen Description BLOOD RIGHT ANTECUBITAL  Final   Special Requests BOTTLES DRAWN AEROBIC AND ANAEROBIC 5CC  Final   Culture NO GROWTH < 24 HOURS  Final   Report Status PENDING  Incomplete  Urine culture     Status: None (Preliminary result)   Collection Time: 12/16/15  4:47 PM  Result Value Ref Range Status   Specimen Description URINE, CLEAN CATCH  Final   Special Requests NONE  Final   Culture NO GROWTH < 24 HOURS  Final   Report Status PENDING  Incomplete  Culture, blood (Routine x 2)     Status: None (Preliminary result)   Collection Time: 12/16/15  5:30 PM  Result Value Ref Range Status   Specimen Description BLOOD LEFT ANTECUBITAL  Final   Special Requests BOTTLES DRAWN AEROBIC AND ANAEROBIC 5CC  Final   Culture NO GROWTH < 24 HOURS  Final   Report Status PENDING  Incomplete     Studies: Dg Chest 2 View  12/16/2015  CLINICAL DATA:  Diabetes and left foot infection.  Sepsis. EXAM: CHEST  2 VIEW COMPARISON:  05/31/2015 FINDINGS: The heart  size and mediastinal contours are within normal limits. Mild scarring again seen in left lung base. No evidence of pulmonary infiltrate or edema. No evidence of pleural effusion. Cervical spine fusion hardware again noted. IMPRESSION: Stable mild left basilar scarring.  No active lung disease. Electronically Signed   By: Earle Gell M.D.   On: 12/16/2015 16:51   Dg Foot Complete Left  12/16/2015  CLINICAL DATA:  Lateral left foot pain. No known injuries. Prior amputation of the 4th and 5th rays. Current history of diabetes. EXAM: LEFT FOOT - COMPLETE 3+ VIEW COMPARISON:  09/18/2015 and earlier, including left foot MRI 09/03/2015. FINDINGS: No evidence of acute or subacute fracture or dislocation. Interval transmetatarsal amputation of the 5th ray since the most recent prior examination. Prior amputation of the 4th ray at the level of the MCP joint. Mild osseous demineralization. Narrowing of the IP joint spaces of the remaining toes, mild in degree. Narrowing of the  joint spaces in the midfoot, unchanged. Small plantar calcaneal spur. IMPRESSION: 1. No acute osseous abnormality. 2. Mild osteoarthritis. 3. Mild osseous demineralization. Electronically Signed   By: Evangeline Dakin M.D.   On: 12/16/2015 18:49    Scheduled Meds: . enoxaparin (LOVENOX) injection  40 mg Subcutaneous Q24H  . fentaNYL  50 mcg Transdermal Q72H  . insulin aspart  0-20 Units Subcutaneous TID WC  . insulin glargine  40 Units Subcutaneous QHS  . piperacillin-tazobactam (ZOSYN)  IV  3.375 g Intravenous Q8H  . pregabalin  100 mg Oral TID  . sodium chloride flush  3 mL Intravenous Q12H  . vancomycin  1,250 mg Intravenous Q24H   Continuous Infusions:     Time spent: 25 minutes    Ananth Fiallos, [place]  [facility] Pager [phone] If 7PM-7AM, please contact night-coverage at [email or web address], password [facility] 12/17/2015, 10:49 AM  LOS: 1 day

## 2015-12-17 NOTE — Consult Note (Addendum)
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  Followed by Dr. Meridee Score. Presents on Friday with draining full thickness ulceration on the left lateral foot, Charcot foot deformity. Ulcer is surrounded by boggy and tender area measuring 4cm round in a previously insensate foot. He is admitted for IVAbx. Patient has also sustained a medical device related pressure injury on the anterior foot from the Dar[employer] shoe strap, again in an insensate foot.  Is to see [name] next on 4/27. Wound type:Neuropathic  Pressure Ulcer POA: No Measurement: Full thickness ulcer on the lateral foot measures 3cm x 3cm with depth undetermined due to the presence of yellow slough obscuring the wound bed. Small amount of light yellow exudate, no odor. Boggy presentation circumferentially, with patient complaining of pain in a previously insensate foot. Pressure injury Stage 2 on the anterior foot measures 2cm x 1cm  X 0.2cm with a red, moist and granulating wound bed and serous exudate. Wound bed:As described above. Drainage (amount, consistency, odor) As described above. Periwound:intact, dry.  Evidence of previous surgeries. Dressing procedure/placement/frequency: Suggest continue antibiotic therapy and refer to [name] for more immediate evaluation than 4/27.  I have implemented conservative wound care and off loading of the heel until he or one of his partners can see.If you agree, please order consult. [place] nursing team will not follow, but will remain available to this patient, the nursing and medical teams.  Please re-consult if needed. Thanks, Maudie Flakes, MSN, RN, [place], Arther Abbott  Pager# [phone]

## 2015-12-17 NOTE — Care Management Note (Addendum)
Case Management Note  Patient Details  Name: ARMANDO BUKHARI MRN: [id number] Date of Birth: 1956-05-27  Subjective/Objective:       60 yo M admitted to r/o sepsis.  He has been ill with abd pain, N & V for several days.  He is diabetic, R BKA, he had surgery on the left foot about 2 months ago, but states he has had some trouble healing Left foot cellulitis involving lateral dorsum with a 0.5 cm superficial ulcer with scanty mucopurulent discharge, nontender, small superficial ulcer involving the mid dorsum. S/p L fifth toe amputation   Action/Plan: received referral to assist with [facility] needs   Expected Discharge Date:    12/18/15              Expected Discharge Plan:  Home/Self Care  In-House Referral:     Discharge planning Services  CM Consult  Post Acute Care Choice:    Choice offered to:     DME Arranged:    DME Agency:     HH Arranged:    HH Agency:     Status of Service:  Completed, signed off  Medicare Important Message Given:    Date Medicare IM Given:    Medicare IM give by:    Date Additional Medicare IM Given:    Additional Medicare Important Message give by:     If discussed at [place] of Stay Meetings, dates discussed:    Additional Comments: met with pt at bedside. He plans to return home with the support of his wife and son. He has a W/C, knee scooter, crutches, cane and a prosthetic. He denies any d/c needs. He reports that he has been changing the dressing to his L foot and he denies any issues with this.   Norina Buzzard, RN 12/17/2015, 3:12 PM

## 2015-12-18 ENCOUNTER — Inpatient Hospital Stay (HOSPITAL_COMMUNITY): Payer: BLUE CROSS/BLUE SHIELD

## 2015-12-18 DIAGNOSIS — I10 Essential (primary) hypertension: Secondary | ICD-10-CM

## 2015-12-18 DIAGNOSIS — I739 Peripheral vascular disease, unspecified: Secondary | ICD-10-CM

## 2015-12-18 LAB — GLUCOSE, CAPILLARY
Glucose-Capillary: 129 mg/dL — ABNORMAL HIGH (ref 65–99)
Glucose-Capillary: 131 mg/dL — ABNORMAL HIGH (ref 65–99)
Glucose-Capillary: 181 mg/dL — ABNORMAL HIGH (ref 65–99)

## 2015-12-18 LAB — BASIC METABOLIC PANEL
Anion gap: 11 (ref 5–15)
BUN: 14 mg/dL (ref 6–20)
CO2: 26 mmol/L (ref 22–32)
Calcium: 8.5 mg/dL — ABNORMAL LOW (ref 8.9–10.3)
Chloride: 103 mmol/L (ref 101–111)
Creatinine, Ser: 1.38 mg/dL — ABNORMAL HIGH (ref 0.61–1.24)
GFR calc Af Amer: >60 mL/min (ref >60)
GFR calc non Af Amer: 54 mL/min — ABNORMAL LOW (ref >60)
Glucose, Bld: 121 mg/dL — ABNORMAL HIGH (ref 65–99)
Potassium: 3.6 mmol/L (ref 3.5–5.1)
Sodium: 140 mmol/L (ref 135–145)

## 2015-12-18 LAB — URINE CULTURE: Culture: 6000 — AB

## 2015-12-18 MED ORDER — AMOXICILLIN-POT CLAVULANATE 875-125 MG PO TABS: 1.0000 | ORAL_TABLET | Freq: Two times a day (BID) | ORAL | Status: AC

## 2015-12-18 NOTE — Progress Notes (Signed)
VASCULAR LAB PRELIMINARY  ARTERIAL  ABI completed:  Left ABI in mild range, however pressures may be falsely elevated.  Abnormal great toe TBI.    RIGHT    LEFT    PRESSURE WAVEFORM  PRESSURE WAVEFORM  BRACHIAL 138 Triphasic  BRACHIAL 138 Triphasic  DP  BKA DP    AT   AT 107 Monophasic   PT   PT 137 Monophasic   PER   PER 126 Monophasic   GREAT TOE  NA GREAT TOE 50 diminished    RIGHT LEFT  ABI BKA 0.36     Danaisha Celli, RVT 12/18/2015, 2:58 PM

## 2015-12-18 NOTE — Progress Notes (Signed)
Pt went to vascular lab for ABI @ 3 pm. Then discharge instruction gave to pt and all questions answered. The RN gave pt his home oral antibiotic prescription.   He pt states he want to finish dinner before discharge.

## 2015-12-18 NOTE — Discharge Summary (Addendum)
Physician Discharge Summary  [name] [id number] DOB: 10-24-1955 DOA: 12/16/2015  PCP: [name], MD  Admit date: 12/16/2015 Discharge date: 12/18/2015  Time spent: 30 minutes  Recommendations for Outpatient Follow-up:  1. Discharged home with home health RN for wound management. 2. Discharge on 10 days course of oral Augmentin. Follow-up with [name] within one week.  3. Follow up with ID Dr. Johnnye Sima in 1-2 weeks  Discharge Diagnoses:  Principal Problem:   Cellulitis of left foot   Active Problems:   Diabetic foot ulcer (HCC)   Chronic diastolic heart failure, NYHA class 2 (HCC)   HTN (hypertension)   GERD (gastroesophageal reflux disease)   Barrett's esophagus without dysplasia   Hyperlipidemia   CAD (dz of distal, mid and proximal RCA with implantation of 3 overlapping drug-eluting stent,)   Type 2 diabetes mellitus with insulin therapy ([place])   Discharge Condition: Fair  Diet recommendation: Diabetic/heart healthy  CODE STATUS: Full code  [facility] Weights   12/16/15 1557 12/16/15 1624  Weight: 117.935 kg (260 lb) 104.356 kg (230 lb 1 oz)    History of present illness:  Please refer to admission H&P for details, in brief, 60 year old obese male with history of diabetes mellitus, controlled, peripheral vascular disease status post right BKA, coronary artery disease, GERD, chronic diastolic CHF hyperlipidemia, cellulitis of the left foot with a diabetic ulcer presented to his PCP office initially for nausea and vomiting of several episodes for the last 3-4 days. He reported having some chills and poor appetite. No history of fevers, abdominal pain, chest pain, shortness of breath, recent travel or sick contact. He reports having a scab on his left foot which came off recently and was having scanty serous discharge for which she was following with [name]. PCP had concerned that it was infected and sent to the ED. In the ED vitals were stable but had elevated  lactic acid of 2.4. There is concern for sepsis, so sepsis pathway was initiated, given IV fluid bolus and started on empiric vancomycin and Zosyn for cellulitis of his left foot.  Hospital Course:  Left foot infected diabetic ulcer with cellulitis -Given empiric vancomycin and Zosyn for now. Has a full-thickness ulcer on his left lateral dorsum with mucopurulent discharge. Wound care consulted,  dressing applied with Ace wrap.Marland Kitchen Also has a superficial ulcer on the mid dorsum. Patient's cellulitis has significantly improved since admission. This morning the ulcer appears clean with minimal discharge. I will send him home on Augmentin for 10 day course in addition to doxycycline he is at home. Patient has been on oral doxycycline since early February for a 3 months course. Was seen by ID Dr. Johnnye Sima who had started patient on antibiotics. Patient was supposed to follow-up with him in one month (as per his note but hasn't seen him yet). i have talked to him to schedule an appointment see him in 1-2 weeks.  I have spoken with [name] at this morning. Patient has an appointment to see him on 4/27 and Dr. modalities see him this week in the office.   Intractable nausea and vomiting Suspect could be related to underlying cellulitis. UA unremarkable. Blood cultures so far negative. Symptoms have resolved since admission.   Type 2 diabetes mellitus Resume glyburide and metformin. Resume home dose Lantus.    GERD with history of Barrett's esophagus Continue PPI  Chronic diastolic CHF Stable. Continue Coreg, Imdur and Benicar, continue Lasix.  CAD with history of stenting, rotated endarterectomy Continue  Plavix, Lipitor  BPH Continue Flomax  Peripheral vascular disease Continue Plavix and statin      Family Communication: None at bedside Disposition Plan: Home with outpt follow up   Consultants:  None  Procedures:  None  Antibiotics:  IV vancomycin and Zosyn  4/7--4/9     Discharge Exam: Filed Vitals:   12/18/15 0454 12/18/15 0600  BP:  121/55  Pulse:  48  Temp:  98 F (36.7 C)  Resp: 16 18    General: Middle aged obese male not in distress  HEENT: No pallor, moist mucosa  Chest: Clear bilaterally  CVS: Normal S1 and S2, no murmurs  GI: Soft, nondistended, nontender, bowel sounds present  Musculoskeletal: Warm, right BKA, left foot cellulitis involving lateral dorsum with a full-thickness ulcer, minimal discharge as compared to yesterday, nontender, small superficial ulcer involving the mid dorsum. Status post left fifth toe amputation  Discharge Instructions    Current Discharge Medication List    START taking these medications   Details  amoxicillin-clavulanate (AUGMENTIN) 875-125 MG tablet Take 1 tablet by mouth 2 (two) times daily. Qty: 20 tablet, Refills: 0      CONTINUE these medications which have NOT CHANGED   Details  ALPRAZolam (XANAX) 0.25 MG tablet Take 0.25 mg by mouth daily. Refills: 0    amLODipine-olmesartan (AZOR) 5-20 MG tablet Take 1 tablet by mouth at bedtime.    aspirin EC 81 MG tablet Take 81 mg by mouth daily.    atorvastatin (LIPITOR) 40 MG tablet Take 40 mg by mouth daily.    !! carvedilol (COREG) 25 MG tablet Take 25 mg by mouth daily. Refills: 0    !! carvedilol (COREG) 6.25 MG tablet Take 6.25 mg by mouth at bedtime.    clobetasol (TEMOVATE) 0.05 % external solution Apply 1 application topically daily as needed (dry scalp).    clopidogrel (PLAVIX) 75 MG tablet Take 75 mg by mouth daily.     clotrimazole-betamethasone (LOTRISONE) cream Apply 1 application topically daily as needed (dry skin - beard).     doxycycline (VIBRA-TABS) 100 MG tablet Take 1 tablet (100 mg total) by mouth 2 (two) times daily. Qty: 60 tablet, Refills: 2   Associated Diagnoses: Diabetic ulcer of left foot associated with type 2 diabetes mellitus (HCC)    eplerenone (INSPRA) 50 MG tablet Take 50 mg by mouth  daily. Refills: 0    ezetimibe (ZETIA) 10 MG tablet Take 10 mg by mouth daily.    feeding supplement, GLUCERNA SHAKE, (GLUCERNA SHAKE) LIQD Take 237 mLs by mouth 2 (two) times daily between meals. Qty: 12 Can, Refills: 0    fentaNYL (DURAGESIC - DOSED MCG/HR) 50 MCG/HR Place 1 patch (50 mcg total) onto the skin every 3 (three) days. Qty: 3 patch, Refills: 0    fluticasone (CUTIVATE) 0.05 % cream Apply 1 application topically 2 (two) times daily as needed (dry skin on face).     glyBURIDE-metformin (GLUCOVANCE) 2.5-500 MG per tablet Take 2 tablets by mouth 2 (two) times daily with a meal.     insulin glargine (LANTUS) 100 unit/mL SOPN Inject 40 Units into the skin at bedtime.    Insulin Glulisine (APIDRA) 100 UNIT/ML Solostar Pen Inject 10 Units into the skin 3 (three) times daily with meals.    isosorbide mononitrate (IMDUR) 120 MG 24 hr tablet Take 120 mg by mouth daily.    Linaclotide (LINZESS) 145 MCG CAPS capsule Take 1 capsule (145 mcg total) by mouth daily as needed. Qty: 30 capsule,  Refills: 2   Associated Diagnoses: Change in bowel habits    meloxicam (MOBIC) 15 MG tablet Take 15 mg by mouth daily. Refills: 0    Multiple Vitamin (MULTIVITAMIN WITH MINERALS) TABS tablet Take 1 tablet by mouth daily.    naproxen sodium (ALEVE) 220 MG tablet Take 220 mg by mouth 2 (two) times daily with a meal.    nitroGLYCERIN (NITRO-BID) 2 % ointment Apply 0.75 inches topically daily as needed (esophogeal spasms).     nitroGLYCERIN (NITROSTAT) 0.4 MG SL tablet Place 1 tablet (0.4 mg total) under the tongue every 5 (five) minutes as needed (as needed for esophageal spasm). Qty: 30 tablet, Refills: 1    omega-3 acid ethyl esters (LOVAZA) 1 G capsule Take 2 g by mouth 2 (two) times daily.    ondansetron (ZOFRAN-ODT) 4 MG disintegrating tablet Take 4 mg by mouth 4 (four) times daily as needed for nausea.    oxyCODONE-acetaminophen (PERCOCET/ROXICET) 5-325 MG per tablet Take 1 tablet by  mouth every 8 (eight) hours as needed. Qty: 30 tablet, Refills: 0    pantoprazole (PROTONIX) 40 MG tablet Take 1 tablet (40 mg total) by mouth daily before breakfast. Qty: 30 tablet, Refills: 3    Polyethyl Glycol-Propyl Glycol (SYSTANE ULTRA OP) Place 1 drop into both eyes as needed (for dry eyes).    pregabalin (LYRICA) 100 MG capsule Take 100 mg by mouth 3 (three) times daily.    Probiotic Product (PROBIOTIC DAILY PO) Take 1 capsule by mouth 2 (two) times daily.    rOPINIRole (REQUIP) 2 MG tablet Take 2 mg by mouth at bedtime.     Tamsulosin HCl (FLOMAX) 0.4 MG CAPS Take 0.4 mg by mouth daily.    torsemide (DEMADEX) 20 MG tablet Take 20 mg by mouth daily.    collagenase (SANTYL) ointment Apply topically daily. Qty: 15 g, Refills: 0     !! - Potential duplicate medications found. Please discuss with provider.    STOP taking these medications     HYDROmorphone (DILAUDID) 2 MG tablet      furosemide (LASIX) 80 MG tablet      olmesartan-hydrochlorothiazide (BENICAR HCT) 40-25 MG tablet        Allergies  Allergen Reactions  . Ivp Dye [Iodinated Diagnostic Agents] Anaphylaxis    Breathing problems  . Adhesive [Tape] Rash    Rash after 2-3 days use   Follow-up Information    Follow up with [name],[name], MD. Call in 1 week.   Specialty:  Orthopedic Surgery   Contact information:   [place] [id number] [phone]        The results of significant diagnostics from this hospitalization (including imaging, microbiology, ancillary and laboratory) are listed below for reference.    Significant Diagnostic Studies: Dg Chest 2 View  12/16/2015  CLINICAL DATA:  Diabetes and left foot infection.  Sepsis. EXAM: CHEST  2 VIEW COMPARISON:  05/31/2015 FINDINGS: The heart size and mediastinal contours are within normal limits. Mild scarring again seen in left lung base. No evidence of pulmonary infiltrate or edema. No evidence of pleural effusion. Cervical  spine fusion hardware again noted. IMPRESSION: Stable mild left basilar scarring.  No active lung disease. Electronically Signed   By: Earle Gell M.D.   On: 12/16/2015 16:51   Dg Foot Complete Left  12/16/2015  CLINICAL DATA:  Lateral left foot pain. No known injuries. Prior amputation of the 4th and 5th rays. Current history of diabetes. EXAM: LEFT FOOT -  COMPLETE 3+ VIEW COMPARISON:  09/18/2015 and earlier, including left foot MRI 09/03/2015. FINDINGS: No evidence of acute or subacute fracture or dislocation. Interval transmetatarsal amputation of the 5th ray since the most recent prior examination. Prior amputation of the 4th ray at the level of the MCP joint. Mild osseous demineralization. Narrowing of the IP joint spaces of the remaining toes, mild in degree. Narrowing of the joint spaces in the midfoot, unchanged. Small plantar calcaneal spur. IMPRESSION: 1. No acute osseous abnormality. 2. Mild osteoarthritis. 3. Mild osseous demineralization. Electronically Signed   By: Evangeline Dakin M.D.   On: 12/16/2015 18:49    Microbiology: Recent Results (from the past 240 hour(s))  Culture, blood (Routine x 2)     Status: None (Preliminary result)   Collection Time: 12/16/15  4:22 PM  Result Value Ref Range Status   Specimen Description BLOOD RIGHT ANTECUBITAL  Final   Special Requests BOTTLES DRAWN AEROBIC AND ANAEROBIC 5CC  Final   Culture NO GROWTH < 24 HOURS  Final   Report Status PENDING  Incomplete  Urine culture     Status: None (Preliminary result)   Collection Time: 12/16/15  4:47 PM  Result Value Ref Range Status   Specimen Description URINE, CLEAN CATCH  Final   Special Requests NONE  Final   Culture NO GROWTH < 24 HOURS  Final   Report Status PENDING  Incomplete  Culture, blood (Routine x 2)     Status: None (Preliminary result)   Collection Time: 12/16/15  5:30 PM  Result Value Ref Range Status   Specimen Description BLOOD LEFT ANTECUBITAL  Final   Special Requests BOTTLES  DRAWN AEROBIC AND ANAEROBIC 5CC  Final   Culture NO GROWTH < 24 HOURS  Final   Report Status PENDING  Incomplete     Labs: Basic Metabolic Panel:  Recent Labs Lab 12/16/15 1622 12/16/15 2221 12/17/15 0058 12/18/15 0601  NA 139 141 139 140  K 4.0 3.6 3.5 3.6  CL 102 108 106 103  CO2 25 23 23 26   GLUCOSE 287* 221* 188* 121*  BUN 15 13 13 14   CREATININE 1.38* 1.25* 1.26* 1.38*  CALCIUM 9.2 8.3* 8.0* 8.5*   Liver Function Tests:  Recent Labs Lab 12/16/15 1622 12/16/15 2221 12/17/15 0058  AST 19 18 16   ALT 13* 12* 12*  ALKPHOS 103 90 83  BILITOT 0.8 0.9 0.8  PROT 6.5 5.7* 5.5*  ALBUMIN 3.7 3.2* 3.0*   No results for input(s): LIPASE, AMYLASE in the last 168 hours. No results for input(s): AMMONIA in the last 168 hours. CBC:  Recent Labs Lab 12/16/15 1622 12/16/15 2221 12/17/15 0058  WBC 7.9 6.3 6.1  NEUTROABS 5.8 3.9  --   HGB 11.0* 9.7* 9.3*  HCT 32.1* 28.1* 26.4*  MCV 82.5 82.4 82.5  PLT 222 161 151   Cardiac Enzymes: No results for input(s): CKTOTAL, CKMB, CKMBINDEX, TROPONINI in the last 168 hours. BNP: BNP (last 3 results) No results for input(s): BNP in the last 8760 hours.  ProBNP (last 3 results) No results for input(s): PROBNP in the last 8760 hours.  CBG:  Recent Labs Lab 12/17/15 0646 12/17/15 1136 12/17/15 1620 12/17/15 2235 12/18/15 0557  GLUCAP 153* 129* 95 151* 129*       Signed:  Louellen Molder MD.  [facility] 12/18/2015, 9:20 AM

## 2015-12-18 NOTE — Discharge Instructions (Signed)
Cellulitis °Cellulitis is an infection of the skin and the tissue under the skin. The infected area is usually red and tender. This happens most often in the arms and lower legs. °HOME CARE  °· Take your antibiotic medicine as told. Finish the medicine even if you start to feel better. °· Keep the infected arm or leg raised (elevated). °· Put a warm cloth on the area up to 4 times per day. °· Only take medicines as told by your doctor. °· Keep all doctor visits as told. °GET HELP IF: °· You see red streaks on the skin coming from the infected area. °· Your red area gets bigger or turns a dark color. °· Your bone or joint under the infected area is painful after the skin heals. °· Your infection comes back in the same area or different area. °· You have a puffy (swollen) bump in the infected area. °· You have new symptoms. °· You have a fever. °GET HELP RIGHT AWAY IF:  °· You feel very sleepy. °· You throw up (vomit) or have watery poop (diarrhea). °· You feel sick and have muscle aches and pains. °  °This information is not intended to replace advice given to you by your health care provider. Make sure you discuss any questions you have with your health care provider. °  °Document Released: 02/13/2008 Document Revised: 05/18/2015 Document Reviewed: 11/12/2011 °Elsevier Interactive Patient Education ©2016 Elsevier Inc. ° °

## 2015-12-18 NOTE — Progress Notes (Addendum)
CM received call from [facility] rep, [name] who states pt is active with [place].  CM calls pt  Who NOW confirms he is active with [place] and would like to continue services with [place]. CM called Lennette Bihari with notification of discharge and HHRN order.  No other CM needs were communicated.  CM spoke with pt for choice of home health agency. Pt chooses [facility] to render [facility].  Referral called to [facility] rep, tiffany. No other CM needs were communicated.

## 2015-12-19 LAB — HEMOGLOBIN A1C
Hgb A1c MFr Bld: 6.9 % — ABNORMAL HIGH (ref 4.8–5.6)
Hgb A1c MFr Bld: 6.8 % — ABNORMAL HIGH (ref 4.8–5.6)
Mean Plasma Glucose: 151 mg/dL
Mean Plasma Glucose: 148 mg/dL

## 2015-12-21 LAB — CULTURE, BLOOD (ROUTINE X 2)
Culture: NO GROWTH
Culture: NO GROWTH

## 2016-02-23 ENCOUNTER — Other Ambulatory Visit: Payer: Self-pay | Admitting: *Deleted

## 2016-02-23 DIAGNOSIS — Z48812 Encounter for surgical aftercare following surgery on the circulatory system: Secondary | ICD-10-CM

## 2016-02-23 DIAGNOSIS — I6523 Occlusion and stenosis of bilateral carotid arteries: Secondary | ICD-10-CM

## 2016-02-29 ENCOUNTER — Encounter: Payer: Self-pay | Admitting: Family

## 2016-03-06 ENCOUNTER — Ambulatory Visit (HOSPITAL_COMMUNITY): Payer: Medicare Other | Attending: Family

## 2016-03-06 ENCOUNTER — Ambulatory Visit: Payer: Medicare Other | Admitting: Family

## 2016-03-20 ENCOUNTER — Encounter: Payer: Self-pay | Admitting: Family

## 2016-03-23 ENCOUNTER — Ambulatory Visit (HOSPITAL_COMMUNITY)
Admission: RE | Admit: 2016-03-23 | Discharge: 2016-03-23 | Disposition: A | Discharge status: Home / Self Care | Payer: BLUE CROSS/BLUE SHIELD | Source: Ambulatory Visit | Attending: Family | Admitting: Family

## 2016-03-23 ENCOUNTER — Ambulatory Visit (INDEPENDENT_AMBULATORY_CARE_PROVIDER_SITE_OTHER): Payer: BLUE CROSS/BLUE SHIELD | Admitting: Family

## 2016-03-23 ENCOUNTER — Encounter: Payer: Self-pay | Admitting: Family

## 2016-03-23 ENCOUNTER — Ambulatory Visit (HOSPITAL_COMMUNITY): Payer: Medicare Other

## 2016-03-23 VITALS — BP 165/84 | HR 65 | Temp 98.8°F | Resp 16 | Ht 74.0 in | Wt 250.0 lb

## 2016-03-23 DIAGNOSIS — G4733 Obstructive sleep apnea (adult) (pediatric): Secondary | ICD-10-CM | POA: Insufficient documentation

## 2016-03-23 DIAGNOSIS — I6523 Occlusion and stenosis of bilateral carotid arteries: Secondary | ICD-10-CM | POA: Insufficient documentation

## 2016-03-23 DIAGNOSIS — E1142 Type 2 diabetes mellitus with diabetic polyneuropathy: Secondary | ICD-10-CM | POA: Diagnosis not present

## 2016-03-23 DIAGNOSIS — Z48812 Encounter for surgical aftercare following surgery on the circulatory system: Secondary | ICD-10-CM

## 2016-03-23 DIAGNOSIS — I110 Hypertensive heart disease with heart failure: Secondary | ICD-10-CM | POA: Diagnosis not present

## 2016-03-23 DIAGNOSIS — I70245 Atherosclerosis of native arteries of left leg with ulceration of other part of foot: Secondary | ICD-10-CM

## 2016-03-23 DIAGNOSIS — E785 Hyperlipidemia, unspecified: Secondary | ICD-10-CM | POA: Diagnosis not present

## 2016-03-23 DIAGNOSIS — I6522 Occlusion and stenosis of left carotid artery: Secondary | ICD-10-CM | POA: Diagnosis not present

## 2016-03-23 DIAGNOSIS — I509 Heart failure, unspecified: Secondary | ICD-10-CM | POA: Insufficient documentation

## 2016-03-23 DIAGNOSIS — K219 Gastro-esophageal reflux disease without esophagitis: Secondary | ICD-10-CM | POA: Insufficient documentation

## 2016-03-23 DIAGNOSIS — Z87891 Personal history of nicotine dependence: Secondary | ICD-10-CM | POA: Diagnosis not present

## 2016-03-23 DIAGNOSIS — J449 Chronic obstructive pulmonary disease, unspecified: Secondary | ICD-10-CM | POA: Diagnosis not present

## 2016-03-23 DIAGNOSIS — Z9889 Other specified postprocedural states: Secondary | ICD-10-CM

## 2016-03-23 DIAGNOSIS — Z89511 Acquired absence of right leg below knee: Secondary | ICD-10-CM

## 2016-03-23 DIAGNOSIS — Z89521 Acquired absence of right knee: Secondary | ICD-10-CM

## 2016-03-23 NOTE — Patient Instructions (Signed)
Stroke Prevention Some medical conditions and behaviors are associated with an increased chance of having a stroke. You may prevent a stroke by making healthy choices and managing medical conditions. HOW CAN I REDUCE MY RISK OF HAVING A STROKE?   Stay physically active. Get at least 30 minutes of activity on most or all days.  Do not smoke. It may also be helpful to avoid exposure to secondhand smoke.  Limit alcohol use. Moderate alcohol use is considered to be:  No more than 2 drinks per day for men.  No more than 1 drink per day for nonpregnant women.  Eat healthy foods. This involves:  Eating 5 or more servings of fruits and vegetables a day.  Making dietary changes that address high blood pressure (hypertension), high cholesterol, diabetes, or obesity.  Manage your cholesterol levels.  Making food choices that are high in fiber and low in saturated fat, trans fat, and cholesterol may control cholesterol levels.  Take any prescribed medicines to control cholesterol as directed by your health care provider.  Manage your diabetes.  Controlling your carbohydrate and sugar intake is recommended to manage diabetes.  Take any prescribed medicines to control diabetes as directed by your health care provider.  Control your hypertension.  Making food choices that are low in salt (sodium), saturated fat, trans fat, and cholesterol is recommended to manage hypertension.  Ask your health care provider if you need treatment to lower your blood pressure. Take any prescribed medicines to control hypertension as directed by your health care provider.  If you are 18-39 years of age, have your blood pressure checked every 3-5 years. If you are 40 years of age or older, have your blood pressure checked every year.  Maintain a healthy weight.  Reducing calorie intake and making food choices that are low in sodium, saturated fat, trans fat, and cholesterol are recommended to manage  weight.  Stop drug abuse.  Avoid taking birth control pills.  Talk to your health care provider about the risks of taking birth control pills if you are over 35 years old, smoke, get migraines, or have ever had a blood clot.  Get evaluated for sleep disorders (sleep apnea).  Talk to your health care provider about getting a sleep evaluation if you snore a lot or have excessive sleepiness.  Take medicines only as directed by your health care provider.  For some people, aspirin or blood thinners (anticoagulants) are helpful in reducing the risk of forming abnormal blood clots that can lead to stroke. If you have the irregular heart rhythm of atrial fibrillation, you should be on a blood thinner unless there is a good reason you cannot take them.  Understand all your medicine instructions.  Make sure that other conditions (such as anemia or atherosclerosis) are addressed. SEEK IMMEDIATE MEDICAL CARE IF:   You have sudden weakness or numbness of the face, arm, or leg, especially on one side of the body.  Your face or eyelid droops to one side.  You have sudden confusion.  You have trouble speaking (aphasia) or understanding.  You have sudden trouble seeing in one or both eyes.  You have sudden trouble walking.  You have dizziness.  You have a loss of balance or coordination.  You have a sudden, severe headache with no known cause.  You have new chest pain or an irregular heartbeat. Any of these symptoms may represent a serious problem that is an emergency. Do not wait to see if the symptoms will   go away. Get medical help at once. Call your local emergency services (911 in [place] Do not drive yourself to the hospital.   This information is not intended to replace advice given to you by your health care provider. Make sure you discuss any questions you have with your health care provider.   Document Released: 10/04/2004 Document Revised: 09/17/2014 Document Reviewed:  02/27/2013 Elsevier Interactive Patient Education 2016 Elsevier Inc.  

## 2016-03-23 NOTE — Progress Notes (Signed)
Filed Vitals:   03/23/16 1422 03/23/16 1424 03/23/16 1426  BP: 190/83 176/85 165/84  Pulse: 65 65 65  Temp: 98.8 F (37.1 C)    Resp: 16    Height: 6\' 2"  (1.88 m)    Weight: 250 lb (113.399 kg)    SpO2: 99%

## 2016-03-23 NOTE — Progress Notes (Signed)
Chief Complaint: Follow up Extracranial Carotid Artery Stenosis   History of Present Illness  [name] is a 60 y.o. male  patient of Dr. Kellie Simmering who is s/p left CEA on 11/10/2010, he returns today for follow up. Pt denies any history of stroke or TIA.  He also has severe PAD. 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; states that since then his DM improved.  He had a left 5th ray amputation in February 2017 by [name] for an infected ulcer, per pt. [name] is treating the ulcer on the lateral aspect of pt's left foot. He has 6 stents in his LE's.  ESI's did not help his back issues, is considering nerve stimulator in his back; legs giving out, this apparently is attributed to his back issues. He also has DM peripheral neuropathy.  Pt states Dr.George Adams [phone]) at [facility] told pt that he now has good circulation in his legs after he had 2 stents placed in each lower leg in 2013, after his July, 2013 visit with Dr. Kellie Simmering, at [facility].  Dr. Andree Elk, Vascular surgeon in the [place] area, was following pt's PAD. Pt states he wants [place] to follow his PAD now.   [name] is his new cardiologist as Dr. Rollene Fare is no longer available to pt. He had 3 cardiac stents placed early 2016 by [name].  Pt Diabetic: Yes, states in good control with last A1C at 6.4 Pt smoker: former smoker, quit February, 2013  He takes Plavix, ASA, and a statin.    Past Medical History  Diagnosis Date  . Carotid artery occlusion 11/10/10    LEFT CAROTID ENDARTERECTOMY  . Diabetes mellitus   . Hypertension   . Hyperlipidemia   . COPD (chronic obstructive pulmonary disease) ([place])   . DJD (degenerative joint disease)   . GERD (gastroesophageal reflux disease)   . Substernal chest pain   . 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 Slurred speech     AS PER WIFE IN D/C NOTE 11/10/10  . PAD (peripheral artery disease) ([place])     Distal  aortogram June 2012. Atherectomy left popliteal artery July 2012.   . Barrett's esophagus   . Fatty liver   . Diverticulosis of colon (without mention of hemorrhage)   . Emphysema of lung ([place])   . Osteomyelitis ([place])   . Complication of anesthesia     BP WENT UP AT [facility] "  . Sleep apnea     has not used CPAP in e years - took self off  . H/O hiatal hernia   . Neuromuscular disorder ([place])     peripheral neuropathy  . Full dentures   . Wears glasses   . CHF (congestive heart failure) ([place])   . History of echocardiogram 08/22/2011    Mildly hypertrophic left ventricle with normal systolic function and mild diastolic dysfunction. mildly dilated left atrium. no significant valvular abnormalities. no pericardial effusion no change from previous study 10/2010. the ejection fraction =>55%  . History of cardiovascular stress test 03/23/2011    ejection fraction measured 51%, with an end-diastolic volume of 123456 ml and an end-systolic volume of 81 ml. no evidence of myocardial ischemia.  . H/O Doppler ultrasound 06/28/2012    Lower venous duplex left-- findings consistent with acute deep vein thrombosis involving the personal vein of the left lower extremity. No evidence of Baker's cyst  on the left. Duplex scan of the left lower extremity arteries reveal adequate blood flow.   . History of Holter monitoring 02/14/2012    Scan quality is fair due to baseline artifact. Predominant rhythm is Sinus. Veentricular Ectopy was noted in single beat form. The total recording time was 24:00 hours. total beats =[id number]. the total time analyzed was 23:58 hours. Heart  Max = 102 at 1:18:22, Min= 52 at 9:56:19 am., Avg= 70. There were 0 pause(s) > 2.5 seconds. the longest pause = 0.00 sec. at 1:51 pm(1)  . OSA (obstructive sleep apnea) 12/26/08    AHI of 6.3/hr overall, 32.7/hr during REM sleep and an RDI of 24.8/hr and 50.9/hr.    Social History Social History  Substance Use Topics  . Smoking status: Former Smoker  -- 0.50 packs/day for 35 years    Types: E-cigarettes    Quit date: 10/28/2011  . Smokeless tobacco: Never Used     Comment: VAPOR CIGARETTES  . Alcohol Use: 0.0 oz/week    0 Standard drinks or equivalent per week     Comment: rare    Family History Family History  Problem Relation Age of Onset  . Heart disease Father     Before age 42-  CAD, BPG  . Diabetes Father     Amputation  . Cancer Father     PROSTATE  . Hyperlipidemia Father   . Hypertension Father   . Heart attack Father     Triple BPG  . Varicose Veins Father   . Arthritis      GRANDMOTHER  . Hypertension      OTHER FAMILY MEMBERS  . Colon cancer Brother   . Cancer Brother     Colon  . Diabetes Brother   . Heart disease Brother 22    A-Fib. Before age 85  . Hyperlipidemia Brother   . Hypertension Brother   . Cancer Sister     Breast  . Hyperlipidemia Sister   . Hypertension Sister   . Hypertension Son     Surgical History Past Surgical History  Procedure Laterality Date  . Laminectomy      X 3 LUMBAR AND X 2 CERVICAL SPINE OPERATIONS  . Carotid endarterectomy  11/10/10  . Hernia repair      LEFT INGUINAL AND UMBILICAL REPAIRS  . Anterior fusion cervical spine  02/06/06    C4-5, C5-6, C6-7; SURGEON DR. MAX COHEN  . Penile prosthesis implant  08/14/05    INFRAPUBIC INSERTION OF INFLATABLE PENILE PROSTHESIS; SURGEON DR. Amalia Hailey  . Laparoscopic cholecystectomy w/ cholangiography  11/09/04    SURGEON DR. Luella Cook  . Total knee arthroplasty  07/2002    RIGHT KNEE ; SURGEON  DR. [name] ALSO HAD ARTHROSCOPIC RIGHT KNEE IN  10/2001  . Shoulder arthroscopy    . Cholecystectomy    . Amputation  11/05/2011    Procedure: AMPUTATION RAY;  Surgeon: Wylene Simmer, MD;  Location: [place];  Service: Orthopedics;  Laterality: Right;  Amputation of Right 4&5th Toes  . Femoral artery stent      x6  . Amputation Left 11/26/2012    Procedure: AMPUTATION RAY;  Surgeon: Wylene Simmer, MD;  Location: [place];  Service:  Orthopedics;  Laterality: Left;  fourth ray amputation  . Cardiac catheterization  10/31/04    2009  . Carpal tunnel release Right 10/21/2013    Procedure: RIGHT CARPAL TUNNEL RELEASE;  Surgeon: Wynonia Sours, MD;  Location: [place];  Service: Orthopedics;  Laterality: Right;  . Ulnar nerve transposition Right 10/21/2013    Procedure: RIGHT ELBOW  ULNAR NERVE DECOMPRESSION;  Surgeon: Wynonia Sours, MD;  Location: [place];  Service: Orthopedics;  Laterality: Right;  . Joint replacement Right 2001    Total  . Spine surgery    . Neck surgery    . Esophageal manometry Bilateral 07/19/2014    Procedure: ESOPHAGEAL MANOMETRY (EM);  Surgeon: Jerene Bears, MD;  Location: [facility];  Service: Gastroenterology;  Laterality: Bilateral;  . Lower extremity angiogram N/A 03/19/2012    Procedure: LOWER EXTREMITY ANGIOGRAM;  Surgeon: Burnell Blanks, MD;  Location: [facility] CATH LAB;  Service: Cardiovascular;  Laterality: N/A;  . Amputation Right 08/27/2014    Procedure: Transmetatarsal Amputation;  Surgeon: Newt Minion, MD;  Location: [place];  Service: Orthopedics;  Laterality: Right;  . Left heart catheterization with coronary angiogram N/A 10/29/2014    Procedure: LEFT HEART CATHETERIZATION WITH CORONARY ANGIOGRAM;  Surgeon: Laverda Page, MD;  Location: [facility] CATH LAB;  Service: Cardiovascular;  Laterality: N/A;  . Percutaneous coronary stent intervention (pci-s) Right 10/29/2014    Procedure: PERCUTANEOUS CORONARY STENT INTERVENTION (PCI-S);  Surgeon: Laverda Page, MD;  Location: [facility] CATH LAB;  Service: Cardiovascular;  Laterality: Right;  . Amputation Right 01/14/2015    Procedure: AMPUTATION BELOW KNEE;  Surgeon: Newt Minion, MD;  Location: [place];  Service: Orthopedics;  Laterality: Right;  . Carotid endarterectomy Left 11/10/2010    Subtotal occlusion of left internal carotid artery with left hemispheric transient ischemic attacks.  . Tonsillectomy    .  Colonoscopy    . Amputation Left 10/21/2015    Procedure: Left Foot 5th Ray Amputation;  Surgeon: Newt Minion, MD;  Location: [employer];  Service: Orthopedics;  Laterality: Left;    Allergies  Allergen Reactions  . Ivp Dye [Iodinated Diagnostic Agents] Anaphylaxis and Other (See Comments)    Breathing problems Breathing problems  . Adhesive [Tape] Rash    Rash after 2-3 days use    Current Outpatient Prescriptions  Medication Sig Dispense Refill  . ALPRAZolam (XANAX) 0.25 MG tablet Take 0.25 mg by mouth daily.  0  . amLODipine-olmesartan (AZOR) 5-20 MG tablet Take 1 tablet by mouth at bedtime.    Marland Kitchen aspirin EC 81 MG tablet Take 81 mg by mouth daily.    Marland Kitchen atorvastatin (LIPITOR) 40 MG tablet Take 40 mg by mouth daily.    . carvedilol (COREG) 25 MG tablet Take 25 mg by mouth daily.  0  . carvedilol (COREG) 6.25 MG tablet Take 6.25 mg by mouth at bedtime.    . clobetasol (TEMOVATE) 0.05 % external solution Apply 1 application topically daily as needed (dry scalp).    . clopidogrel (PLAVIX) 75 MG tablet Take 75 mg by mouth daily.     . clotrimazole-betamethasone (LOTRISONE) cream Apply 1 application topically daily as needed (dry skin - beard).     . collagenase (SANTYL) ointment Apply topically daily. (Patient not taking: Reported on 12/17/2015) 15 g 0  . doxycycline (VIBRA-TABS) 100 MG tablet Take 1 tablet (100 mg total) by mouth 2 (two) times daily. (Patient taking differently: Take 100 mg by mouth 2 (two) times daily. 3 month course started 10/12/15) 60 tablet 2  . eplerenone (INSPRA) 50 MG tablet Take 50 mg by mouth daily.  0  . ezetimibe (ZETIA) 10 MG tablet Take 10 mg by mouth daily.    . feeding supplement, GLUCERNA SHAKE, ([place]) LIQD  Take 237 mLs by mouth 2 (two) times daily between meals. 12 Can 0  . fentaNYL (DURAGESIC - DOSED MCG/HR) 50 MCG/HR Place 1 patch (50 mcg total) onto the skin every 3 (three) days. (Patient taking differently: Place 50 mcg onto the skin every 3 (three)  days. Change patch every three days) 3 patch 0  . fluticasone (CUTIVATE) 0.05 % cream Apply 1 application topically 2 (two) times daily as needed (dry skin on face).     Marland Kitchen glyBURIDE-metformin (GLUCOVANCE) 2.5-500 MG per tablet Take 2 tablets by mouth 2 (two) times daily with a meal.     . insulin glargine (LANTUS) 100 unit/mL SOPN Inject 40 Units into the skin at bedtime.    . Insulin Glulisine (APIDRA) 100 UNIT/ML Solostar Pen Inject 10 Units into the skin 3 (three) times daily with meals.    . isosorbide mononitrate (IMDUR) 120 MG 24 hr tablet Take 120 mg by mouth daily.    . Linaclotide (LINZESS) 145 MCG CAPS capsule Take 1 capsule (145 mcg total) by mouth daily as needed. (Patient taking differently: Take 145 mcg by mouth daily as needed (constipation). ) 30 capsule 2  . meloxicam (MOBIC) 15 MG tablet Take 15 mg by mouth daily.  0  . Multiple Vitamin (MULTIVITAMIN WITH MINERALS) TABS tablet Take 1 tablet by mouth daily.    . naproxen sodium (ALEVE) 220 MG tablet Take 220 mg by mouth 2 (two) times daily with a meal.    . nitroGLYCERIN (NITRO-BID) 2 % ointment Apply 0.75 inches topically daily as needed (esophogeal spasms).     . nitroGLYCERIN (NITROSTAT) 0.4 MG SL tablet Place 1 tablet (0.4 mg total) under the tongue every 5 (five) minutes as needed (as needed for esophageal spasm). (Patient taking differently: Place 0.4 mg under the tongue every 5 (five) minutes as needed for chest pain. ) 30 tablet 1  . omega-3 acid ethyl esters (LOVAZA) 1 G capsule Take 2 g by mouth 2 (two) times daily.    . ondansetron (ZOFRAN-ODT) 4 MG disintegrating tablet Take 4 mg by mouth 4 (four) times daily as needed for nausea.    Marland Kitchen oxyCODONE-acetaminophen (PERCOCET/ROXICET) 5-325 MG per tablet Take 1 tablet by mouth every 8 (eight) hours as needed. (Patient taking differently: Take 1 tablet by mouth every 8 (eight) hours. scheduled) 30 tablet 0  . pantoprazole (PROTONIX) 40 MG tablet Take 1 tablet (40 mg total) by  mouth daily before breakfast. 30 tablet 3  . Polyethyl Glycol-Propyl Glycol (SYSTANE ULTRA OP) Place 1 drop into both eyes as needed (for dry eyes).    . pregabalin (LYRICA) 100 MG capsule Take 100 mg by mouth 3 (three) times daily.    . Probiotic Product (PROBIOTIC DAILY PO) Take 1 capsule by mouth 2 (two) times daily.    Marland Kitchen rOPINIRole (REQUIP) 2 MG tablet Take 2 mg by mouth at bedtime.     . Tamsulosin HCl (FLOMAX) 0.4 MG CAPS Take 0.4 mg by mouth daily.    Marland Kitchen torsemide (DEMADEX) 20 MG tablet Take 20 mg by mouth daily.     No current facility-administered medications for this visit.    Review of Systems : See HPI for pertinent positives and negatives.  Physical Examination  Filed Vitals:   03/23/16 1422 03/23/16 1424 03/23/16 1426  BP: 190/83 176/85 165/84  Pulse: 65 65 65  Temp: 98.8 F (37.1 C)    Resp: 16    Height: 6\' 2"  (1.88 m)    Weight: 250 lb (  113.399 kg)    SpO2: 99%     Body mass index is 32.08 kg/(m^2).  General: WDWN obese male in NAD Gait: Normal, using right BKA prosthesis.  HENT: WNL Eyes: Pupils equal Pulmonary: normal non-labored breathing, good air movement, CTAB Cardiac: RRR, no murmur detected  Abdomen: soft, NT, no palpable masses Skin: no rashes, no cellulitis.  VASCULAR EXAM  Carotid Bruits Left Right   positive Negative  radial pulses: 2+ palpable and =   VASCULAR EXAM: Extremities without ischemic changes  without Gangrene; shallow ulcer lateral aspect left foot, no signs of infection, no drainage.  Right BKA.      LE Pulses LEFT RIGHT   FEMORAL  palpable faintly palpable    POPLITEAL not palpable  not palpable   POSTERIOR TIBIAL not palpable, moderate Doppler signal  BKA    DORSALIS PEDIS   not palpable,  moderate Doppler signal  BKA        PERONEAL Brisk Doppler signal BKA    Musculoskeletal: no muscle wasting or atrophy; 1+ left pretibial pitting edema. Right BKA. Neurologic: A&O X 3; Appropriate Affect ;  MOTOR FUNCTION: 5/5 Symmetric, CN 2-12 intact Speech is fluent/normal             April 2017 ABI's in [facility]: - Right: BKA. - Left: ABI in the mild range. Pressures may be falsely elevated.  Waveforms sound monophasic. TBI abnormal with very dampened  waveforms.   Non-Invasive Vascular Imaging CAROTID DUPLEX 03/23/2016   Right ICA: <40% stenosis. Left ICA: CEA site with no restenosis, evidence of mild hyperplasia at the distal patch site. Both subclavian arteries are multiphasic (normal). Both vertebral arteries are antegrade (normal). No significant change compared to exam of 02/25/15.   Assessment: NAASIR NEWCOMER is a 60 y.o. male who is s/p left CEA on 11/10/2010. A vascular surgeon in [place] that placed some arterial stents in his legs was following pt's PAD; pt requested that our practice monitor his PAD. Pt has no history of stroke or TIA. Today's carotid Duplex suggests <40% stenosis of the right internal carotid artery and patent left carotid endarterectomy with mild (<40%) hyperplasia at the distal patch site. No significant change compared to prior exam a year ago.   He is s/p right BKA in February 2016 by [name]. He had a left 5th ray amputation in February 2017 by [name] for an infected ulcer, per pt. [name] is treating the ulcer on the lateral aspect of pt's left foot. He has 6 stents in his LE's.  ESI's did not help his back issues, is considering nerve stimulator in his back; legs giving out, this apparently is attributed to his back issues. He also has DM peripheral neuropathy.  I advised pt to see his PCP as soon as possible re his elevated blood pressure. Pt states his blood pressure at home is 119/68 up to what is is now, if he does not  take his blood pressure medications on time.  Plan: Follow-up in 3 months with left ABI (which would be 6 months after the ABI's at [facility] in April, 2017), Carotid Duplex scan in a year.  I discussed in depth with the patient the nature of atherosclerosis, and emphasized the importance of maximal medical management including strict control of blood pressure, blood glucose, and lipid levels, obtaining regular exercise, and continued cessation of smoking.  The patient is aware that without maximal medical management the underlying atherosclerotic disease process will progress, limiting the benefit of  any interventions. The patient was given information about stroke prevention and what symptoms should prompt the patient to seek immediate medical care. Thank you for allowing [place] to participate in this patient's care.  Harold Chambers, RN, MSN, FNP-C [facility] Office: [phone]  Clinic Physician: Bridgett Larsson on call  03/23/2016 2:31 PM

## 2016-03-28 ENCOUNTER — Ambulatory Visit: Payer: Medicare Other | Admitting: Family

## 2016-04-11 ENCOUNTER — Other Ambulatory Visit: Payer: Self-pay | Admitting: *Deleted

## 2016-04-11 DIAGNOSIS — I739 Peripheral vascular disease, unspecified: Secondary | ICD-10-CM

## 2016-04-23 ENCOUNTER — Other Ambulatory Visit (HOSPITAL_COMMUNITY): Payer: Self-pay | Admitting: Family

## 2016-04-24 ENCOUNTER — Encounter (HOSPITAL_COMMUNITY): Payer: Self-pay | Admitting: *Deleted

## 2016-04-24 NOTE — Progress Notes (Signed)
Pt stated that he called surgeon's office to make him aware that he " was not feeling well." Pt  stated that he was instructed to  "go home and lay down" once he made them aware of his symptoms of being " hot then cold." Pt denies having any acute cardiopulmonary issues. Pt  made aware to stop vitamins, Fish Oil ( Omega 3  acids), herbal medications and NSAID's ( Mobic, Aleve). Pt stated that he was not instructed to stop taking Plavix; lvm with Malachy Mood, nursing staff to clarify Plavix , pre-op pt instructions. Pt stated that his fasting blood glucose ranges from 120-180. Pt made aware of diabetes protocol to hold Glyburide tonight and DOS, take only half of the scheduled 50 units ( per pt- not 40 units as indicated) of Lantus tonight = 25 units, check blood glucose every 2 hours prior to arrival, interventions for a blood glucose <70 and>220. Pt verbalized understanding of all pre-op instructions.

## 2016-04-25 ENCOUNTER — Encounter (HOSPITAL_COMMUNITY)
Admission: RE | Disposition: A | Discharge status: Home / Self Care | Payer: Self-pay | Source: Ambulatory Visit | Attending: Orthopedic Surgery

## 2016-04-25 ENCOUNTER — Ambulatory Visit (HOSPITAL_COMMUNITY)
Admission: RE | Admit: 2016-04-25 | Discharge: 2016-04-26 | Disposition: A | Discharge status: Home / Self Care | Payer: BLUE CROSS/BLUE SHIELD | Source: Ambulatory Visit | Attending: Orthopedic Surgery | Admitting: Orthopedic Surgery

## 2016-04-25 ENCOUNTER — Ambulatory Visit (HOSPITAL_COMMUNITY): Payer: BLUE CROSS/BLUE SHIELD | Admitting: Certified Registered"

## 2016-04-25 ENCOUNTER — Encounter (HOSPITAL_COMMUNITY): Payer: Self-pay | Admitting: *Deleted

## 2016-04-25 DIAGNOSIS — K219 Gastro-esophageal reflux disease without esophagitis: Secondary | ICD-10-CM | POA: Diagnosis not present

## 2016-04-25 DIAGNOSIS — Z7902 Long term (current) use of antithrombotics/antiplatelets: Secondary | ICD-10-CM | POA: Insufficient documentation

## 2016-04-25 DIAGNOSIS — I110 Hypertensive heart disease with heart failure: Secondary | ICD-10-CM | POA: Diagnosis not present

## 2016-04-25 DIAGNOSIS — Z791 Long term (current) use of non-steroidal anti-inflammatories (NSAID): Secondary | ICD-10-CM | POA: Diagnosis not present

## 2016-04-25 DIAGNOSIS — I509 Heart failure, unspecified: Secondary | ICD-10-CM | POA: Diagnosis not present

## 2016-04-25 DIAGNOSIS — M86179 Other acute osteomyelitis, unspecified ankle and foot: Secondary | ICD-10-CM | POA: Diagnosis present

## 2016-04-25 DIAGNOSIS — E11621 Type 2 diabetes mellitus with foot ulcer: Secondary | ICD-10-CM | POA: Insufficient documentation

## 2016-04-25 DIAGNOSIS — Z89511 Acquired absence of right leg below knee: Secondary | ICD-10-CM | POA: Diagnosis not present

## 2016-04-25 DIAGNOSIS — E1169 Type 2 diabetes mellitus with other specified complication: Secondary | ICD-10-CM | POA: Insufficient documentation

## 2016-04-25 DIAGNOSIS — Z7982 Long term (current) use of aspirin: Secondary | ICD-10-CM | POA: Diagnosis not present

## 2016-04-25 DIAGNOSIS — Z794 Long term (current) use of insulin: Secondary | ICD-10-CM | POA: Diagnosis not present

## 2016-04-25 DIAGNOSIS — L97524 Non-pressure chronic ulcer of other part of left foot with necrosis of bone: Secondary | ICD-10-CM | POA: Insufficient documentation

## 2016-04-25 DIAGNOSIS — Z79899 Other long term (current) drug therapy: Secondary | ICD-10-CM | POA: Insufficient documentation

## 2016-04-25 DIAGNOSIS — E785 Hyperlipidemia, unspecified: Secondary | ICD-10-CM | POA: Diagnosis not present

## 2016-04-25 DIAGNOSIS — Z955 Presence of coronary angioplasty implant and graft: Secondary | ICD-10-CM | POA: Insufficient documentation

## 2016-04-25 DIAGNOSIS — Z6832 Body mass index (BMI) 32.0-32.9, adult: Secondary | ICD-10-CM | POA: Diagnosis not present

## 2016-04-25 DIAGNOSIS — G4733 Obstructive sleep apnea (adult) (pediatric): Secondary | ICD-10-CM | POA: Insufficient documentation

## 2016-04-25 DIAGNOSIS — I2510 Atherosclerotic heart disease of native coronary artery without angina pectoris: Secondary | ICD-10-CM | POA: Insufficient documentation

## 2016-04-25 DIAGNOSIS — M86172 Other acute osteomyelitis, left ankle and foot: Secondary | ICD-10-CM

## 2016-04-25 DIAGNOSIS — E1142 Type 2 diabetes mellitus with diabetic polyneuropathy: Secondary | ICD-10-CM | POA: Diagnosis not present

## 2016-04-25 DIAGNOSIS — Z96651 Presence of right artificial knee joint: Secondary | ICD-10-CM | POA: Diagnosis not present

## 2016-04-25 DIAGNOSIS — M869 Osteomyelitis, unspecified: Secondary | ICD-10-CM | POA: Insufficient documentation

## 2016-04-25 DIAGNOSIS — Z87891 Personal history of nicotine dependence: Secondary | ICD-10-CM | POA: Diagnosis not present

## 2016-04-25 DIAGNOSIS — J449 Chronic obstructive pulmonary disease, unspecified: Secondary | ICD-10-CM | POA: Insufficient documentation

## 2016-04-25 HISTORY — PX: I & D EXTREMITY: SHX5045

## 2016-04-25 HISTORY — PX: FOOT SURGERY: SHX648

## 2016-04-25 LAB — COMPREHENSIVE METABOLIC PANEL
ALT: 11 U/L — ABNORMAL LOW (ref 17–63)
AST: 15 U/L (ref 15–41)
Albumin: 3 g/dL — ABNORMAL LOW (ref 3.5–5.0)
Alkaline Phosphatase: 128 U/L — ABNORMAL HIGH (ref 38–126)
Anion gap: 11 (ref 5–15)
BUN: 16 mg/dL (ref 6–20)
CO2: 27 mmol/L (ref 22–32)
Calcium: 9 mg/dL (ref 8.9–10.3)
Chloride: 94 mmol/L — ABNORMAL LOW (ref 101–111)
Creatinine, Ser: 1.64 mg/dL — ABNORMAL HIGH (ref 0.61–1.24)
GFR calc Af Amer: 51 mL/min — ABNORMAL LOW (ref >60)
GFR calc non Af Amer: 44 mL/min — ABNORMAL LOW (ref >60)
Glucose, Bld: 431 mg/dL — ABNORMAL HIGH (ref 65–99)
Potassium: 3.8 mmol/L (ref 3.5–5.1)
Sodium: 132 mmol/L — ABNORMAL LOW (ref 135–145)
Total Bilirubin: 0.8 mg/dL (ref 0.3–1.2)
Total Protein: 6.2 g/dL — ABNORMAL LOW (ref 6.5–8.1)

## 2016-04-25 LAB — CBC
HCT: 30.1 % — ABNORMAL LOW (ref 39.0–52.0)
Hemoglobin: 10.5 g/dL — ABNORMAL LOW (ref 13.0–17.0)
MCH: 28.3 pg (ref 26.0–34.0)
MCHC: 34.9 g/dL (ref 30.0–36.0)
MCV: 81.1 fL (ref 78.0–100.0)
Platelets: 276 10*3/uL (ref 150–400)
RBC: 3.71 MIL/uL — ABNORMAL LOW (ref 4.22–5.81)
RDW: 12.4 % (ref 11.5–15.5)
WBC: 10.6 10*3/uL — ABNORMAL HIGH (ref 4.0–10.5)

## 2016-04-25 LAB — GLUCOSE, CAPILLARY
Glucose-Capillary: 445 mg/dL — ABNORMAL HIGH (ref 65–99)
Glucose-Capillary: 442 mg/dL — ABNORMAL HIGH (ref 65–99)
Glucose-Capillary: 354 mg/dL — ABNORMAL HIGH (ref 65–99)
Glucose-Capillary: 275 mg/dL — ABNORMAL HIGH (ref 65–99)
Glucose-Capillary: 243 mg/dL — ABNORMAL HIGH (ref 65–99)
Glucose-Capillary: 291 mg/dL — ABNORMAL HIGH (ref 65–99)

## 2016-04-25 SURGERY — IRRIGATION AND DEBRIDEMENT EXTREMITY
Anesthesia: General | Laterality: Left

## 2016-04-25 MED ORDER — CARVEDILOL 25 MG PO TABS
25.0000 mg | ORAL_TABLET | Freq: Every day | ORAL | Status: DC
  Administered 2016-04-26: 25 mg via ORAL
  Filled 2016-04-25: qty 1

## 2016-04-25 MED ORDER — HYDROMORPHONE HCL 1 MG/ML IJ SOLN
1.0000 mg | INTRAMUSCULAR | Status: DC | PRN
  Administered 2016-04-25 (×2): 1 mg via INTRAVENOUS
  Filled 2016-04-25: qty 1

## 2016-04-25 MED ORDER — METOCLOPRAMIDE HCL 5 MG PO TABS: 5.0000 mg | ORAL_TABLET | Freq: Three times a day (TID) | ORAL | Status: DC | PRN

## 2016-04-25 MED ORDER — ONDANSETRON HCL 4 MG/2ML IJ SOLN: 4.0000 mg | Freq: Four times a day (QID) | INTRAMUSCULAR | Status: DC | PRN

## 2016-04-25 MED ORDER — INSULIN ASPART 100 UNIT/ML ~~LOC~~ SOLN
SUBCUTANEOUS | Status: AC
  Administered 2016-04-25: 15 [IU] via SUBCUTANEOUS
  Filled 2016-04-25: qty 1

## 2016-04-25 MED ORDER — ATORVASTATIN CALCIUM 40 MG PO TABS
40.0000 mg | ORAL_TABLET | Freq: Every day | ORAL | Status: DC
  Administered 2016-04-26: 40 mg via ORAL
  Filled 2016-04-25: qty 1

## 2016-04-25 MED ORDER — OXYCODONE HCL 5 MG PO TABS
5.0000 mg | ORAL_TABLET | Freq: Once | ORAL | Status: AC | PRN
  Administered 2016-04-25: 5 mg via ORAL

## 2016-04-25 MED ORDER — POLYETHYLENE GLYCOL 3350 17 G PO PACK: 17.0000 g | PACK | Freq: Every day | ORAL | Status: DC | PRN

## 2016-04-25 MED ORDER — MIDAZOLAM HCL 5 MG/5ML IJ SOLN
INTRAMUSCULAR | Status: DC | PRN
Start: 2016-04-25 — End: 2016-04-25
  Administered 2016-04-25: 2 mg via INTRAVENOUS

## 2016-04-25 MED ORDER — HYDROMORPHONE HCL 1 MG/ML IJ SOLN
0.2500 mg | INTRAMUSCULAR | Status: DC | PRN
  Administered 2016-04-25 (×2): 0.5 mg via INTRAVENOUS

## 2016-04-25 MED ORDER — AMLODIPINE-OLMESARTAN 5-20 MG PO TABS: 1.0000 | ORAL_TABLET | Freq: Every day | ORAL | Status: DC

## 2016-04-25 MED ORDER — OXYCODONE HCL 5 MG PO TABS
ORAL_TABLET | ORAL | Status: AC
  Filled 2016-04-25: qty 1

## 2016-04-25 MED ORDER — HYDROMORPHONE HCL 1 MG/ML IJ SOLN
INTRAMUSCULAR | Status: AC
  Filled 2016-04-25: qty 1

## 2016-04-25 MED ORDER — EPHEDRINE SULFATE 50 MG/ML IJ SOLN
INTRAMUSCULAR | Status: DC | PRN
  Administered 2016-04-25: 15 mg via INTRAVENOUS

## 2016-04-25 MED ORDER — OXYCODONE HCL 5 MG/5ML PO SOLN: 5.0000 mg | Freq: Once | ORAL | Status: AC | PRN

## 2016-04-25 MED ORDER — METHOCARBAMOL 500 MG PO TABS
500.0000 mg | ORAL_TABLET | Freq: Four times a day (QID) | ORAL | Status: DC | PRN
  Administered 2016-04-25 – 2016-04-26 (×2): 500 mg via ORAL
  Filled 2016-04-25 (×2): qty 1

## 2016-04-25 MED ORDER — LACTATED RINGERS IV SOLN
INTRAVENOUS | Status: DC
  Administered 2016-04-25: 11:00:00 via INTRAVENOUS

## 2016-04-25 MED ORDER — PROPOFOL 10 MG/ML IV BOLUS
INTRAVENOUS | Status: AC
  Filled 2016-04-25: qty 20

## 2016-04-25 MED ORDER — ISOSORBIDE MONONITRATE ER 60 MG PO TB24
120.0000 mg | ORAL_TABLET | Freq: Every day | ORAL | Status: DC
  Administered 2016-04-26: 120 mg via ORAL
  Filled 2016-04-25: qty 2

## 2016-04-25 MED ORDER — INSULIN ASPART 100 UNIT/ML ~~LOC~~ SOLN
0.0000 [IU] | Freq: Three times a day (TID) | SUBCUTANEOUS | Status: DC
  Administered 2016-04-25: 15 [IU] via SUBCUTANEOUS
  Administered 2016-04-25: 5 [IU] via SUBCUTANEOUS
  Administered 2016-04-26: 8 [IU] via SUBCUTANEOUS

## 2016-04-25 MED ORDER — PROPOFOL 10 MG/ML IV BOLUS
INTRAVENOUS | Status: DC | PRN
  Administered 2016-04-25: 150 mg via INTRAVENOUS
  Administered 2016-04-25: 50 mg via INTRAVENOUS

## 2016-04-25 MED ORDER — INSULIN GLARGINE 100 UNIT/ML ~~LOC~~ SOLN
20.0000 [IU] | Freq: Every day | SUBCUTANEOUS | Status: DC
  Administered 2016-04-25: 20 [IU] via SUBCUTANEOUS
  Filled 2016-04-25 (×2): qty 0.2

## 2016-04-25 MED ORDER — INSULIN ASPART 100 UNIT/ML ~~LOC~~ SOLN
SUBCUTANEOUS | Status: AC
  Filled 2016-04-25: qty 15

## 2016-04-25 MED ORDER — ONDANSETRON HCL 4 MG/2ML IJ SOLN
INTRAMUSCULAR | Status: DC | PRN
  Administered 2016-04-25: 4 mg via INTRAVENOUS

## 2016-04-25 MED ORDER — EPHEDRINE SULFATE 50 MG/ML IJ SOLN
INTRAMUSCULAR | Status: AC
  Filled 2016-04-25: qty 1

## 2016-04-25 MED ORDER — CLOPIDOGREL BISULFATE 75 MG PO TABS
75.0000 mg | ORAL_TABLET | Freq: Every day | ORAL | Status: DC
  Administered 2016-04-26: 75 mg via ORAL
  Filled 2016-04-25: qty 1

## 2016-04-25 MED ORDER — INSULIN ASPART 100 UNIT/ML ~~LOC~~ SOLN
15.0000 [IU] | Freq: Once | SUBCUTANEOUS | Status: AC
  Administered 2016-04-25: 15 [IU] via SUBCUTANEOUS
  Filled 2016-04-25: qty 0.15

## 2016-04-25 MED ORDER — SODIUM CHLORIDE 0.9 % IV SOLN
INTRAVENOUS | Status: DC
  Administered 2016-04-25: 16:00:00 via INTRAVENOUS

## 2016-04-25 MED ORDER — TORSEMIDE 20 MG PO TABS
40.0000 mg | ORAL_TABLET | Freq: Every day | ORAL | Status: DC
  Administered 2016-04-26: 40 mg via ORAL
  Filled 2016-04-25 (×2): qty 2

## 2016-04-25 MED ORDER — FENTANYL 50 MCG/HR TD PT72: 50.0000 ug | MEDICATED_PATCH | TRANSDERMAL | Status: DC

## 2016-04-25 MED ORDER — MEPERIDINE HCL 25 MG/ML IJ SOLN: 6.2500 mg | INTRAMUSCULAR | Status: DC | PRN

## 2016-04-25 MED ORDER — 0.9 % SODIUM CHLORIDE (POUR BTL) OPTIME
TOPICAL | Status: DC | PRN
  Administered 2016-04-25: 1000 mL

## 2016-04-25 MED ORDER — ACETAMINOPHEN 650 MG RE SUPP
650.0000 mg | Freq: Four times a day (QID) | RECTAL | Status: DC | PRN
Start: 2016-04-25 — End: 2016-04-26

## 2016-04-25 MED ORDER — DEXTROSE 5 % IV SOLN
500.0000 mg | Freq: Four times a day (QID) | INTRAVENOUS | Status: DC | PRN
  Administered 2016-04-25: 500 mg via INTRAVENOUS
  Filled 2016-04-25 (×3): qty 5

## 2016-04-25 MED ORDER — PANTOPRAZOLE SODIUM 40 MG PO TBEC
40.0000 mg | DELAYED_RELEASE_TABLET | Freq: Every day | ORAL | Status: DC
  Administered 2016-04-26: 40 mg via ORAL
  Filled 2016-04-25: qty 1

## 2016-04-25 MED ORDER — ROPINIROLE HCL 1 MG PO TABS
2.0000 mg | ORAL_TABLET | Freq: Every day | ORAL | Status: DC
  Administered 2016-04-25: 2 mg via ORAL
  Filled 2016-04-25: qty 2

## 2016-04-25 MED ORDER — ONDANSETRON HCL 4 MG/2ML IJ SOLN: 4.0000 mg | Freq: Once | INTRAMUSCULAR | Status: DC | PRN

## 2016-04-25 MED ORDER — ONDANSETRON HCL 4 MG PO TABS: 4.0000 mg | ORAL_TABLET | Freq: Four times a day (QID) | ORAL | Status: DC | PRN

## 2016-04-25 MED ORDER — INSULIN ASPART 100 UNIT/ML ~~LOC~~ SOLN
4.0000 [IU] | Freq: Three times a day (TID) | SUBCUTANEOUS | Status: DC
  Administered 2016-04-25 – 2016-04-26 (×2): 4 [IU] via SUBCUTANEOUS

## 2016-04-25 MED ORDER — PHENYLEPHRINE 40 MCG/ML (10ML) SYRINGE FOR IV PUSH (FOR BLOOD PRESSURE SUPPORT)
PREFILLED_SYRINGE | INTRAVENOUS | Status: AC
  Filled 2016-04-25: qty 10

## 2016-04-25 MED ORDER — ASPIRIN EC 81 MG PO TBEC
81.0000 mg | DELAYED_RELEASE_TABLET | Freq: Every day | ORAL | Status: DC
  Administered 2016-04-26: 81 mg via ORAL
  Filled 2016-04-25: qty 1

## 2016-04-25 MED ORDER — GLYBURIDE-METFORMIN 2.5-500 MG PO TABS: 2.0000 | ORAL_TABLET | Freq: Two times a day (BID) | ORAL | Status: DC

## 2016-04-25 MED ORDER — CEFAZOLIN SODIUM-DEXTROSE 2-4 GM/100ML-% IV SOLN
2.0000 g | INTRAVENOUS | Status: AC
  Administered 2016-04-25: 2 g via INTRAVENOUS
  Filled 2016-04-25: qty 100

## 2016-04-25 MED ORDER — BISACODYL 10 MG RE SUPP: 10.0000 mg | Freq: Every day | RECTAL | Status: DC | PRN

## 2016-04-25 MED ORDER — LIDOCAINE HCL (CARDIAC) 20 MG/ML IV SOLN
INTRAVENOUS | Status: DC | PRN
  Administered 2016-04-25: 100 mg via INTRAVENOUS

## 2016-04-25 MED ORDER — MIDAZOLAM HCL 2 MG/2ML IJ SOLN
INTRAMUSCULAR | Status: AC
  Filled 2016-04-25: qty 2

## 2016-04-25 MED ORDER — METOCLOPRAMIDE HCL 5 MG/ML IJ SOLN: 5.0000 mg | Freq: Three times a day (TID) | INTRAMUSCULAR | Status: DC | PRN

## 2016-04-25 MED ORDER — PHENYLEPHRINE HCL 10 MG/ML IJ SOLN
INTRAMUSCULAR | Status: DC | PRN
  Administered 2016-04-25: 160 ug via INTRAVENOUS
  Administered 2016-04-25 (×2): 120 ug via INTRAVENOUS

## 2016-04-25 MED ORDER — LIDOCAINE 2% (20 MG/ML) 5 ML SYRINGE
INTRAMUSCULAR | Status: AC
  Filled 2016-04-25: qty 5

## 2016-04-25 MED ORDER — PREGABALIN 50 MG PO CAPS
100.0000 mg | ORAL_CAPSULE | Freq: Three times a day (TID) | ORAL | Status: DC
  Administered 2016-04-25 – 2016-04-26 (×3): 100 mg via ORAL
  Filled 2016-04-25 (×3): qty 2

## 2016-04-25 MED ORDER — TAMSULOSIN HCL 0.4 MG PO CAPS
0.4000 mg | ORAL_CAPSULE | Freq: Every day | ORAL | Status: DC
  Administered 2016-04-26: 0.4 mg via ORAL
  Filled 2016-04-25: qty 1

## 2016-04-25 MED ORDER — OXYCODONE HCL 5 MG PO TABS
5.0000 mg | ORAL_TABLET | ORAL | Status: DC | PRN
  Administered 2016-04-25 – 2016-04-26 (×3): 10 mg via ORAL
  Filled 2016-04-25 (×3): qty 2

## 2016-04-25 MED ORDER — CEFAZOLIN SODIUM-DEXTROSE 2-4 GM/100ML-% IV SOLN
2.0000 g | Freq: Four times a day (QID) | INTRAVENOUS | Status: AC
  Administered 2016-04-25 – 2016-04-26 (×3): 2 g via INTRAVENOUS
  Filled 2016-04-25 (×3): qty 100

## 2016-04-25 MED ORDER — EZETIMIBE 10 MG PO TABS
10.0000 mg | ORAL_TABLET | Freq: Every day | ORAL | Status: DC
  Administered 2016-04-25 – 2016-04-26 (×2): 10 mg via ORAL
  Filled 2016-04-25 (×2): qty 1

## 2016-04-25 MED ORDER — SPIRONOLACTONE 25 MG PO TABS
25.0000 mg | ORAL_TABLET | Freq: Every day | ORAL | Status: DC
  Administered 2016-04-25 – 2016-04-26 (×2): 25 mg via ORAL
  Filled 2016-04-25 (×2): qty 1

## 2016-04-25 MED ORDER — FENTANYL CITRATE (PF) 100 MCG/2ML IJ SOLN
INTRAMUSCULAR | Status: DC | PRN
  Administered 2016-04-25: 100 ug via INTRAVENOUS

## 2016-04-25 MED ORDER — ALPRAZOLAM 0.25 MG PO TABS
0.2500 mg | ORAL_TABLET | Freq: Every day | ORAL | Status: DC
  Administered 2016-04-26: 0.25 mg via ORAL
  Filled 2016-04-25: qty 1

## 2016-04-25 MED ORDER — FENTANYL CITRATE (PF) 100 MCG/2ML IJ SOLN
INTRAMUSCULAR | Status: AC
  Filled 2016-04-25: qty 2

## 2016-04-25 MED ORDER — ACETAMINOPHEN 325 MG PO TABS: 650.0000 mg | ORAL_TABLET | Freq: Four times a day (QID) | ORAL | Status: DC | PRN

## 2016-04-25 MED ORDER — CHLORHEXIDINE GLUCONATE 4 % EX LIQD: 60.0000 mL | Freq: Once | CUTANEOUS | Status: DC

## 2016-04-25 MED ORDER — ONDANSETRON HCL 4 MG/2ML IJ SOLN
INTRAMUSCULAR | Status: AC
  Filled 2016-04-25: qty 2

## 2016-04-25 MED ORDER — CARVEDILOL 6.25 MG PO TABS
6.2500 mg | ORAL_TABLET | Freq: Every day | ORAL | Status: DC
  Administered 2016-04-25: 6.25 mg via ORAL
  Filled 2016-04-25: qty 2

## 2016-04-25 SURGICAL SUPPLY — 40 items
BLADE SURG 10 STRL SS (BLADE) IMPLANT
BLADE SURG 21 STRL SS (BLADE) ×2 IMPLANT
BNDG COHESIVE 4X5 TAN STRL (GAUZE/BANDAGES/DRESSINGS) IMPLANT
BNDG COHESIVE 6X5 TAN STRL LF (GAUZE/BANDAGES/DRESSINGS) IMPLANT
BNDG GAUZE ELAST 4 BULKY (GAUZE/BANDAGES/DRESSINGS) ×4 IMPLANT
COVER SURGICAL LIGHT HANDLE (MISCELLANEOUS) ×4 IMPLANT
DRAPE INCISE IOBAN 66X45 STRL (DRAPES) ×1 IMPLANT
DRAPE U-SHAPE 47X51 STRL (DRAPES) ×2 IMPLANT
DRSG ADAPTIC 3X8 NADH LF (GAUZE/BANDAGES/DRESSINGS) ×1 IMPLANT
DURAPREP 26ML APPLICATOR (WOUND CARE) ×2 IMPLANT
ELECT CAUTERY BLADE 6.4 (BLADE) IMPLANT
ELECT REM PT RETURN 9FT ADLT (ELECTROSURGICAL)
ELECTRODE REM PT RTRN 9FT ADLT (ELECTROSURGICAL) IMPLANT
GAUZE SPONGE 4X4 12PLY STRL (GAUZE/BANDAGES/DRESSINGS) ×2 IMPLANT
GLOVE BIOGEL PI IND STRL 9 (GLOVE) ×1 IMPLANT
GLOVE BIOGEL PI INDICATOR 9 (GLOVE) ×1
GLOVE SURG ORTHO 9.0 STRL STRW (GLOVE) ×2 IMPLANT
GOWN STRL REUS W/ TWL LRG LVL3 (GOWN DISPOSABLE) ×1 IMPLANT
GOWN STRL REUS W/ TWL XL LVL3 (GOWN DISPOSABLE) ×2 IMPLANT
GOWN STRL REUS W/TWL LRG LVL3 (GOWN DISPOSABLE) ×2
GOWN STRL REUS W/TWL XL LVL3 (GOWN DISPOSABLE) ×4
HANDPIECE INTERPULSE COAX TIP (DISPOSABLE)
KIT BASIN OR (CUSTOM PROCEDURE TRAY) ×2 IMPLANT
KIT PREVENA INCISION MGT 13 (CANNISTER) ×1 IMPLANT
KIT ROOM TURNOVER OR (KITS) ×2 IMPLANT
MANIFOLD NEPTUNE II (INSTRUMENTS) ×2 IMPLANT
NS IRRIG 1000ML POUR BTL (IV SOLUTION) ×2 IMPLANT
PACK ORTHO EXTREMITY (CUSTOM PROCEDURE TRAY) ×2 IMPLANT
PAD ARMBOARD 7.5X6 YLW CONV (MISCELLANEOUS) ×4 IMPLANT
SET HNDPC FAN SPRY TIP SCT (DISPOSABLE) IMPLANT
STOCKINETTE IMPERVIOUS 9X36 MD (GAUZE/BANDAGES/DRESSINGS) IMPLANT
SUT ETHILON 2 0 PSLX (SUTURE) ×2 IMPLANT
SWAB COLLECTION DEVICE MRSA (MISCELLANEOUS) ×1 IMPLANT
TOWEL OR 17X24 6PK STRL BLUE (TOWEL DISPOSABLE) ×2 IMPLANT
TOWEL OR 17X26 10 PK STRL BLUE (TOWEL DISPOSABLE) ×2 IMPLANT
TUBE ANAEROBIC SPECIMEN COL (MISCELLANEOUS) IMPLANT
TUBE CONNECTING 12X1/4 (SUCTIONS) ×2 IMPLANT
UNDERPAD 30X30 INCONTINENT (UNDERPADS AND DIAPERS) ×2 IMPLANT
WATER STERILE IRR 1000ML POUR (IV SOLUTION) ×2 IMPLANT
YANKAUER SUCT BULB TIP NO VENT (SUCTIONS) ×2 IMPLANT

## 2016-04-25 NOTE — H&P (Signed)
[name] is an 60 y.o. male.   Chief Complaint: Ulceration osteomyelitis base of the fifth metatarsal left foot. HPI: Patient is a 60 year old gentleman with diabetic insensate neuropathy who is about 6 months status post left foot fifth ray amputation. Patient presents at this time with progressive ulceration osteomyelitis at the base of the fifth metatarsal.  Past Medical History:  Diagnosis Date  . Barrett's esophagus   . Carotid artery occlusion 11/10/10   LEFT CAROTID ENDARTERECTOMY  . CHF (congestive heart failure) ([place])   . Complication of anesthesia    BP WENT UP AT [facility] "  . COPD (chronic obstructive pulmonary disease) ([place])   . Diabetes mellitus   . Diverticulosis of colon (without mention of hemorrhage)   . DJD (degenerative joint disease)   . Emphysema of lung ([place])   . Fatty liver   . Full dentures   . GERD (gastroesophageal reflux disease)   . H/O Doppler ultrasound 06/28/2012   Lower venous duplex left-- findings consistent with acute deep vein thrombosis involving the personal vein of the left lower extremity. No evidence of Baker's cyst on the left. Duplex scan of the left lower extremity arteries reveal adequate blood flow.   . H/O hiatal hernia   . History of cardiovascular stress test 03/23/2011   ejection fraction measured 51%, with an end-diastolic volume of 123456 ml and an end-systolic volume of 81 ml. no evidence of myocardial ischemia.  Marland Kitchen History of echocardiogram 08/22/2011   Mildly hypertrophic left ventricle with normal systolic function and mild diastolic dysfunction. mildly dilated left atrium. no significant valvular abnormalities. no pericardial effusion no change from previous study 10/2010. the ejection fraction =>55%  . History of Holter monitoring 02/14/2012   Scan quality is fair due to baseline artifact. Predominant rhythm is Sinus. Veentricular Ectopy was noted in single beat form. The total recording time was 24:00 hours. total beats =[id number]. the total  time analyzed was 23:58 hours. Heart  Max = 102 at 1:18:22, Min= 52 at 9:56:19 am., Avg= 70. There were 0 pause(s) > 2.5 seconds. the longest pause = 0.00 sec. at 1:51 pm(1)  . Hyperlipidemia   . Hypertension   . Neuromuscular disorder ([place])    peripheral neuropathy  . OSA (obstructive sleep apnea) 12/26/08   AHI of 6.3/hr overall, 32.7/hr during REM sleep and an RDI of 24.8/hr and 50.9/hr.  . Osteomyelitis ([place])   . Osteomyelitis ([place])    left 5th metatarsal  . PAD (peripheral artery disease) ([place])    Distal aortogram June 2012. Atherectomy left popliteal artery July 2012.   . Sleep apnea    has not used CPAP in e years - took self off  . Slurred speech    AS PER WIFE IN D/C NOTE 11/10/10  . Substernal chest pain   . Visual color changes    LEFT EYE; BUT NOT TOTAL BLINDNESS  . Wears glasses     Past Surgical History:  Procedure Laterality Date  . AMPUTATION  11/05/2011   Procedure: AMPUTATION RAY;  Surgeon: Wylene Simmer, MD;  Location: [facility];  Service: Orthopedics;  Laterality: Right;  Amputation of Right 4&5th Toes  . AMPUTATION Left 11/26/2012   Procedure: AMPUTATION RAY;  Surgeon: Wylene Simmer, MD;  Location: [facility];  Service: Orthopedics;  Laterality: Left;  fourth ray amputation  . AMPUTATION Right 08/27/2014   Procedure: Transmetatarsal Amputation;  Surgeon: Newt Minion, MD;  Location: [facility];  Service: Orthopedics;  Laterality: Right;  . AMPUTATION Right  01/14/2015   Procedure: AMPUTATION BELOW KNEE;  Surgeon: Newt Minion, MD;  Location: [facility];  Service: Orthopedics;  Laterality: Right;  . AMPUTATION Left 10/21/2015   Procedure: Left Foot 5th Ray Amputation;  Surgeon: Newt Minion, MD;  Location: [facility];  Service: Orthopedics;  Laterality: Left;  . ANTERIOR FUSION CERVICAL SPINE  02/06/06   C4-5, C5-6, C6-7; SURGEON DR. MAX COHEN  . CARDIAC CATHETERIZATION  10/31/04   2009  . CAROTID ENDARTERECTOMY  11/10/10  . CAROTID ENDARTERECTOMY Left 11/10/2010   Subtotal occlusion of left  internal carotid artery with left hemispheric transient ischemic attacks.  . CARPAL TUNNEL RELEASE Right 10/21/2013   Procedure: RIGHT CARPAL TUNNEL RELEASE;  Surgeon: Wynonia Sours, MD;  Location: [facility];  Service: Orthopedics;  Laterality: Right;  . CHOLECYSTECTOMY    . COLONOSCOPY    . ESOPHAGEAL MANOMETRY Bilateral 07/19/2014   Procedure: ESOPHAGEAL MANOMETRY (EM);  Surgeon: Jerene Bears, MD;  Location: [facility];  Service: Gastroenterology;  Laterality: Bilateral;  . FEMORAL ARTERY STENT     x6  . HERNIA REPAIR     LEFT INGUINAL AND UMBILICAL REPAIRS  . JOINT REPLACEMENT Right 2001   Total  . LAMINECTOMY     X 3 LUMBAR AND X 2 CERVICAL SPINE OPERATIONS  . LAPAROSCOPIC CHOLECYSTECTOMY W/ CHOLANGIOGRAPHY  11/09/04   SURGEON DR. Luella Cook  . LEFT HEART CATHETERIZATION WITH CORONARY ANGIOGRAM N/A 10/29/2014   Procedure: LEFT HEART CATHETERIZATION WITH CORONARY ANGIOGRAM;  Surgeon: Laverda Page, MD;  Location: [facility] CATH LAB;  Service: Cardiovascular;  Laterality: N/A;  . LOWER EXTREMITY ANGIOGRAM N/A 03/19/2012   Procedure: LOWER EXTREMITY ANGIOGRAM;  Surgeon: Burnell Blanks, MD;  Location: [facility] CATH LAB;  Service: Cardiovascular;  Laterality: N/A;  . NECK SURGERY    . PENILE PROSTHESIS IMPLANT  08/14/05   INFRAPUBIC INSERTION OF INFLATABLE PENILE PROSTHESIS; SURGEON DR. Amalia Hailey  . PERCUTANEOUS CORONARY STENT INTERVENTION (PCI-S) Right 10/29/2014   Procedure: PERCUTANEOUS CORONARY STENT INTERVENTION (PCI-S);  Surgeon: Laverda Page, MD;  Location: [facility] CATH LAB;  Service: Cardiovascular;  Laterality: Right;  . SHOULDER ARTHROSCOPY    . SPINE SURGERY    . TONSILLECTOMY    . TOTAL KNEE ARTHROPLASTY  07/2002   RIGHT KNEE ; SURGEON  DR. [name] ALSO HAD ARTHROSCOPIC RIGHT KNEE IN  10/2001  . ULNAR NERVE TRANSPOSITION Right 10/21/2013   Procedure: RIGHT ELBOW  ULNAR NERVE DECOMPRESSION;  Surgeon: Wynonia Sours, MD;  Location: [facility];  Service:  Orthopedics;  Laterality: Right;    Family History  Problem Relation Age of Onset  . Heart disease Father     Before age 56-  CAD, BPG  . Diabetes Father     Amputation  . Cancer Father     PROSTATE  . Hyperlipidemia Father   . Hypertension Father   . Heart attack Father     Triple BPG  . Varicose Veins Father   . Colon cancer Brother   . Cancer Brother     Colon  . Diabetes Brother   . Heart disease Brother 39    A-Fib. Before age 34  . Hyperlipidemia Brother   . Hypertension Brother   . Cancer Sister     Breast  . Hyperlipidemia Sister   . Hypertension Sister   . Hypertension Son   . Arthritis      GRANDMOTHER  . Hypertension      OTHER FAMILY MEMBERS   Social  History:  reports that he quit smoking about 4 years ago. His smoking use included E-cigarettes. He has a 17.50 pack-year smoking history. He has never used smokeless tobacco. He reports that he drinks alcohol. He reports that he does not use drugs.  Allergies:  Allergies  Allergen Reactions  . Ivp Dye [Iodinated Diagnostic Agents] Anaphylaxis and Other (See Comments)    Breathing problems   . Adhesive [Tape] Rash    Rash after 2-3 days use    No prescriptions prior to admission.    No results found for this or any previous visit (from the past 48 hour(s)). No results found.  Review of Systems  All other systems reviewed and are negative.   Weight 113.4 kg (250 lb). Physical Exam  On examination patient has a palpable dorsalis pedis pulse. He has ulceration of the base of the fifth metatarsal which is approximately 3 cm in diameter and this extends all the way down to the base of the fifth metatarsal and the cuboid. Assessment/Plan Assessment: Diabetic insensate neuropathy status post fifth ray amputation with ulceration osteomyelitis base of the fifth metatarsal and cuboid left foot.  Plan: We'll plan for left foot partial amputation of the cuboid and amputation of the base of the fifth  metatarsal. Risks and benefits were discussed including risk of the wound not healing need for transtibial amputation. Patient states he understands wishes to proceed at this time.  Newt Minion, MD 04/25/2016, 6:55 AM

## 2016-04-25 NOTE — Anesthesia Postprocedure Evaluation (Signed)
Anesthesia Post Note  Patient: [name]  Procedure(s) Performed: Procedure(s) (LRB): EXCISION BASE 5TH METATARSAL AND PARTIAL CUBOID LEFT FOOT (Left)  Patient location during evaluation: PACU Anesthesia Type: General Level of consciousness: awake and alert Pain management: pain level controlled Vital Signs Assessment: post-procedure vital signs reviewed and stable Respiratory status: spontaneous breathing, nonlabored ventilation and respiratory function stable Cardiovascular status: blood pressure returned to baseline and stable Postop Assessment: no signs of nausea or vomiting Anesthetic complications: no    Last Vitals:  Vitals:   04/25/16 1255 04/25/16 1320  BP:  134/73  Pulse: 62 62  Resp: 13 16  Temp:  36.6 C    Last Pain:  Vitals:   04/25/16 1819  TempSrc:   PainSc: Asleep                 Doshie Maggi A

## 2016-04-25 NOTE — Anesthesia Preprocedure Evaluation (Addendum)
Anesthesia Evaluation  Patient identified by MRN, date of birth, ID band Patient awake    Reviewed: Allergy & Precautions, NPO status , Patient's Chart, lab work & pertinent test results  Airway Mallampati: I  TM Distance: >3 FB Neck ROM: Full    Dental  (+) Edentulous Upper, Edentulous Lower, Dental Advisory Given   Pulmonary sleep apnea , former smoker,    breath sounds clear to auscultation       Cardiovascular hypertension, Pt. on medications + angina with exertion + CAD, + Peripheral Vascular Disease and +CHF   Rhythm:Regular Rate:Normal     Neuro/Psych    GI/Hepatic GERD  Medicated and Controlled,  Endo/Other  diabetesMorbid obesity  Renal/GU      Musculoskeletal   Abdominal   Peds  Hematology   Anesthesia Other Findings   Reproductive/Obstetrics                            Anesthesia Physical Anesthesia Plan  ASA: III  Anesthesia Plan: General   Post-op Pain Management:    Induction: Intravenous  Airway Management Planned: LMA  Additional Equipment:   Intra-op Plan:   Post-operative Plan: Extubation in OR  Informed Consent: I have reviewed the patients History and Physical, chart, labs and discussed the procedure including the risks, benefits and alternatives for the proposed anesthesia with the patient or authorized representative who has indicated his/her understanding and acceptance.   Dental advisory given  Plan Discussed with: CRNA, Anesthesiologist and Surgeon  Anesthesia Plan Comments:         Anesthesia Quick Evaluation

## 2016-04-25 NOTE — Anesthesia Procedure Notes (Addendum)
Procedure Name: LMA Insertion Date/Time: 04/25/2016 11:25 AM Performed by: Sampson Si E Pre-anesthesia Checklist: Patient identified, Emergency Drugs available, Suction available, Patient being monitored and Timeout performed Patient Re-evaluated:Patient Re-evaluated prior to inductionOxygen Delivery Method: Circle system utilized Preoxygenation: Pre-oxygenation with 100% oxygen Intubation Type: IV induction Ventilation: Mask ventilation without difficulty and Oral airway inserted - appropriate to patient size LMA: LMA inserted LMA Size: 5.0 Number of attempts: 1 Placement Confirmation: positive ETCO2 and breath sounds checked- equal and bilateral Tube secured with: Tape Dental Injury: Teeth and Oropharynx as per pre-operative assessment

## 2016-04-25 NOTE — Progress Notes (Signed)
notifed dr crews of pts bs=[id number] ok to use SS glycemic protocol to cover pt with 15u novalog sq at this time and order carried out

## 2016-04-25 NOTE — Transfer of Care (Signed)
Immediate Anesthesia Transfer of Care Note  Patient: [name]  Procedure(s) Performed: Procedure(s): EXCISION BASE 5TH METATARSAL AND PARTIAL CUBOID LEFT FOOT (Left)  Patient Location: PACU  Anesthesia Type:General  Level of Consciousness: lethargic and responds to stimulation  Airway & Oxygen Therapy: Patient Spontanous Breathing and Patient connected to nasal cannula oxygen  Post-op Assessment: Report given to RN  Post vital signs: Reviewed and stable  Last Vitals:  Vitals:   04/25/16 0951  BP: (!) 104/58  Pulse: 64  Resp: 18  Temp: 36.7 C    Last Pain:  Vitals:   04/25/16 0951  TempSrc: Oral         Complications: No apparent anesthesia complications

## 2016-04-25 NOTE — Progress Notes (Signed)
Received Diabetes coordinator consult.  Visited with patient about his diabetes.  States that he sees Dr. Wilson Singer for his diabetes control.  Patient states that the nurse told him on the phone prior to his surgery to not take his "sugar" medicine. That is why his blood sugar was so high this morning prior to surgery.  His home DM medications: Lantus 50 units every HS (this has been his dose for about 3 months), Apidra 10 units TID with meals, and Glucovance. Has had diabetes 30-40 years. States that his blood sugars run 120-220 mg/dl at home. If blood sugars continue to be elevated, Lantus dose may need to be increased to 30-35  units every HS and titrated up as needed closer to his home dose. Will continue to monitor blood sugars while in the hospital. Harvel Ricks RN BSN CDE

## 2016-04-25 NOTE — Op Note (Signed)
04/25/2016  12:02 PM  PATIENT:  [name]    PRE-OPERATIVE DIAGNOSIS:  osteomyelitis left 5th metatarsal  POST-OPERATIVE DIAGNOSIS:  Same  PROCEDURE:  EXCISION BASE 5TH METATARSAL AND PARTIAL CUBOID LEFT FOOT Local tissue rearrangement for wound closure 9 x 4 cm. Application of Prevena wound VAC.  SURGEON:  Newt Minion, MD  PHYSICIAN ASSISTANT:None ANESTHESIA:   General  PREOPERATIVE INDICATIONS:  [name] is a  60 y.o. male with a diagnosis of osteomyelitis left 5th metatarsal who failed conservative measures and elected for surgical management.    The risks benefits and alternatives were discussed with the patient preoperatively including but not limited to the risks of infection, bleeding, nerve injury, cardiopulmonary complications, the need for revision surgery, among others, and the patient was willing to proceed.  OPERATIVE IMPLANTS: Prevena wound VAC  OPERATIVE FINDINGS: No deep abscess good granulation tissue around the wound edges  OPERATIVE PROCEDURE: Patient was brought to the operating room and underwent a general anesthetic. After adequate levels anesthesia obtained patient's left lower extremity was prepped using DuraPrep draped into a sterile field a timeout was called. Elliptical incision was made around the ulcer and necrotic bone and the bone and ulcerative tissue were resected in 1 block of tissue. This left a wound 9 x 4 cm. The wound was irrigated with normal saline electrocautery was used for hemostasis. Local tissue rearrangement was used to close the wound 9 x 4 cm. A Prevena wound VAC was applied this had a good suction fit patient was extubated taken to the PACU in stable condition.

## 2016-04-25 NOTE — Progress Notes (Signed)
Today's ekg 'shows' Bifascicular block.  I showed 2016 & todays ekgs to Dr. Al Corpus.  "he's developed a new bundle, just let [name] know. I did call [name] and made him aware--no new orders.  DA

## 2016-04-26 ENCOUNTER — Encounter (HOSPITAL_COMMUNITY): Payer: Self-pay | Admitting: Orthopedic Surgery

## 2016-04-26 ENCOUNTER — Other Ambulatory Visit: Payer: Self-pay | Admitting: Orthopedic Surgery

## 2016-04-26 DIAGNOSIS — E1169 Type 2 diabetes mellitus with other specified complication: Secondary | ICD-10-CM | POA: Diagnosis not present

## 2016-04-26 LAB — GLUCOSE, CAPILLARY
Glucose-Capillary: 297 mg/dL — ABNORMAL HIGH (ref 65–99)
Glucose-Capillary: 277 mg/dL — ABNORMAL HIGH (ref 65–99)

## 2016-04-26 LAB — HEMOGLOBIN A1C
Hgb A1c MFr Bld: 9.2 % — ABNORMAL HIGH (ref 4.8–5.6)
Mean Plasma Glucose: 217 mg/dL

## 2016-04-26 NOTE — Discharge Summary (Signed)
Physician Discharge Summary  Patient ID: [name] MRN: [id number] DOB/AGE: 1955/12/28 60 y.o.  Admit date: 04/25/2016 Discharge date: 04/26/2016  Admission Diagnoses:Osteomyelitis ulceration base of the fifth metatarsal and cuboid left foot  Discharge Diagnoses:  Active Problems:   Osteomyelitis of ankle or foot, acute [facility])   Discharged Condition: stable  Hospital Course: Patient's hospital course was essentially unremarkable. He underwent excision of the base of fifth metatarsal as well as partial excision of the cuboid. Postoperatively patient progressed well and was discharged to home in stable condition.  Consults: None  Significant Diagnostic Studies: labs: Routine labs  Treatments: surgery: See operative note  Discharge Exam: Blood pressure (!) 148/76, pulse 77, temperature 99.4 F (37.4 C), resp. rate 19, height 6\' 2"  (1.88 m), weight 113.4 kg (250 lb), SpO2 96 %. Incision/Wound: Wound VAC with minimal drainage good suction fit at this time  Disposition: 06-Home-Health Care Svc    Follow-up Information    Marisela Line V, MD Follow up in 1 week(s).   Specialty:  Orthopedic Surgery Contact information: [place] [address] [phone]           Signed: Newt Minion 04/26/2016, 7:31 AM

## 2016-04-26 NOTE — Evaluation (Signed)
Physical Therapy Evaluation Patient Details Name: [name] MRN: [id number] DOB: 1956/03/25 Today's Date: 04/26/2016   History of Present Illness  Pt is a 60 y/o male s/p excision of the base of 5th metatarsal and partial cuboid bone of L foot. Wound VAC in place. PMH including but not limited to CHF, COPD, DM, HTN, PAD, peripheral neuropathy, osteomyelitis, R TKA in 2003 and R transtibial amputation in 2016.   Clinical Impression  Pt presented supine in bed with HOB elevated and L LE elevated. Pt was initially asleep and difficult to arouse; however, was able to arouse and participate in evaluation. Prior to admission, pt reported that he was able to ambulate with use of a RW and prosthesis for his R LE. PT attempted to perform a sliding board transfer with pt from his bed to a w/c; however, pt refused to participate in this transfer training. He stated that he was having too much pain and that he would not attempt the transfer at this time. Therapist asked pt how he would transfer from his bed to his w/c and back when he was at home. The pt stated that he would just keep trying and if he fell he would get back up and try again. PT reiterated the importance of NWB L LE status until his physician told him otherwise. Pt stated that he understood. Pt was able to scoot in his bed in sitting using bilateral UEs to lift himself to move approximately 2 ft laterally in both directions. PT spoke with pt's case manager regarding his performance and need for [facility] PT services upon d/c. Pt would continue to benefit from skilled physical therapy services at this time while admitted and after d/c to address his below listed limitations in order to improve his overall safety and independence with functional mobility.      Follow Up Recommendations Home health PT;Supervision/Assistance - 24 hour    Equipment Recommendations  None recommended by PT    Recommendations for Other Services       Precautions /  Restrictions Precautions Precautions: Fall Restrictions Weight Bearing Restrictions: Yes LLE Weight Bearing: Non weight bearing      Mobility  Bed Mobility Overal bed mobility: Needs Assistance Bed Mobility: Supine to Sit;Sit to Supine     Supine to sit: Min guard;HOB elevated Sit to supine: Min guard   General bed mobility comments: pt required increased time and utilized bedrails  Transfers                 General transfer comment: pt refused sliding board transfer training at this time secondary to pain   Ambulation/Gait                Stairs            Wheelchair Mobility    Modified Rankin (Stroke Patients Only)       Balance Overall balance assessment: Needs assistance Sitting-balance support: Feet unsupported;Bilateral upper extremity supported Sitting balance-Leahy Scale: Poor         Standing balance comment: unable to stand during evaluation secondary to NWB L LE status and pt without his prosthesis for R LE                             Pertinent Vitals/Pain Pain Assessment: 0-10 Pain Score: 10-Worst pain ever Pain Location: L foot Pain Descriptors / Indicators: Other (Comment) (unable to describe) Pain Intervention(s): Monitored during session;Repositioned  Home Living Family/patient expects to be discharged to:: Private residence Living Arrangements: Spouse/significant other Available Help at Discharge: Family;Available 24 hours/day Type of Home: Mobile home Home Access: Ramped entrance     Home Layout: One level Home Equipment: [place] - 2 wheels;Cane - single point;Shower seat;Wheelchair - Education officer, community - power      Prior Function Level of Independence: Independent with assistive device(s)         Comments: pt stated he was using the RW for ambulation     Hand Dominance        Extremity/Trunk Assessment   Upper Extremity Assessment: Overall WFL for tasks assessed           Lower  Extremity Assessment: LLE deficits/detail   LLE Deficits / Details: Pt with decreased strength and ROM limitations secondary to post-op. Sensation impaired at baseline; however, pt reported that he could feel light touch of big toe on L foot.  Cervical / Trunk Assessment: Normal  Communication   Communication: HOH  Cognition Arousal/Alertness: Lethargic;Suspect due to medications Behavior During Therapy: [facility] for tasks assessed/performed Overall Cognitive Status: No family/caregiver present to determine baseline cognitive functioning Area of Impairment: Following commands;Safety/judgement;Awareness     Memory: Decreased short-term memory Following Commands: Follows multi-step commands inconsistently;Follows one step commands with increased time Safety/Judgement: Decreased awareness of safety;Decreased awareness of deficits     General Comments: Pt inconsistent throughout evaluation. No family member or caregiver present to determine baseline.    General Comments      Exercises        Assessment/Plan    PT Assessment    PT Diagnosis Difficulty walking;Acute pain   PT Problem List    PT Treatment Interventions     PT Goals (Current goals can be found in the Care Plan section) Acute Rehab PT Goals Patient Stated Goal: return home PT Goal Formulation: With patient Time For Goal Achievement: 05/03/16 Potential to Achieve Goals: Good    Frequency     Barriers to discharge        Co-evaluation               End of Session   Activity Tolerance: Patient limited by pain;Patient limited by lethargy Patient left: in bed;with call bell/phone within reach Nurse Communication: Mobility status;Weight bearing status    Functional Assessment Tool Used: clinical judgement Functional Limitation: Mobility: Walking and moving around Mobility: Walking and Moving Around Current Status [id number]): At least 1 percent but less than 20 percent impaired, limited or  restricted Mobility: Walking and Moving Around Goal Status [phone]): 0 percent impaired, limited or restricted    Time: [id number] PT Time Calculation (min) (ACUTE ONLY): 25 min   Charges:   PT Evaluation $PT Eval Moderate Complexity: 1 Procedure PT Treatments $Therapeutic Activity: 8-22 mins   PT G Codes:   PT G-Codes **NOT FOR INPATIENT CLASS** Functional Assessment Tool Used: clinical judgement Functional Limitation: Mobility: Walking and moving around Mobility: Walking and Moving Around Current Status [id number]): At least 1 percent but less than 20 percent impaired, limited or restricted Mobility: Walking and Moving Around Goal Status [phone]): 0 percent impaired, limited or restricted    [facility] 04/26/2016, 12:58 PM Sherie Don, [place], DPT [phone]

## 2016-04-26 NOTE — Care Management Note (Signed)
Case Management Note  Patient Details  Name: [name] MRN: [id number] Date of Birth: Oct 20, 1955  Subjective/Objective:    60 y.o. M who  underwent excision of the base of fifth metatarsal as well as partial excision of the cuboid.  Currently has [name] Vac intact to L LE. Lives home with spouse               Action/Plan:[name] with [name] will arrange [name], HHPT/OT through [name], pt has DME.  Anticipate discharge home today. No further CM needs but will be available should additional discharge needs arise.   Expected Discharge Date:                  Expected Discharge Plan:  Home/Self Care  In-House Referral:  NA  Discharge planning Services  CM Consult  Post Acute Care Choice:  Home Health Choice offered to:  Patient  DME Arranged:    DME Agency:  NA  HH Arranged:  RN, PT Ethel Agency:  [place] (Has used in the past)  Status of Service:  Completed, signed off  If discussed at [place] of Stay Meetings, dates discussed:    Additional Comments:  [name], RN 04/26/2016, 11:20 AM

## 2016-04-26 NOTE — Progress Notes (Signed)
Discussed discharge summary with patient. Reviewed all medications with patient. Patient will be transported out by wheel chair. Patient ready for discharge.

## 2016-05-04 ENCOUNTER — Encounter (HOSPITAL_COMMUNITY): Payer: Self-pay | Admitting: Emergency Medicine

## 2016-05-04 ENCOUNTER — Inpatient Hospital Stay (HOSPITAL_COMMUNITY)
Admission: EM | Admit: 2016-05-04 | Discharge: 2016-05-11 | DRG: 638 | Disposition: A | Discharge status: Home Health | Payer: BLUE CROSS/BLUE SHIELD | Attending: Internal Medicine | Admitting: Internal Medicine

## 2016-05-04 ENCOUNTER — Emergency Department (HOSPITAL_COMMUNITY): Payer: BLUE CROSS/BLUE SHIELD

## 2016-05-04 DIAGNOSIS — D6489 Other specified anemias: Secondary | ICD-10-CM | POA: Diagnosis present

## 2016-05-04 DIAGNOSIS — L089 Local infection of the skin and subcutaneous tissue, unspecified: Secondary | ICD-10-CM | POA: Diagnosis present

## 2016-05-04 DIAGNOSIS — E11628 Type 2 diabetes mellitus with other skin complications: Secondary | ICD-10-CM | POA: Diagnosis present

## 2016-05-04 DIAGNOSIS — E1142 Type 2 diabetes mellitus with diabetic polyneuropathy: Secondary | ICD-10-CM | POA: Diagnosis present

## 2016-05-04 DIAGNOSIS — Z89511 Acquired absence of right leg below knee: Secondary | ICD-10-CM

## 2016-05-04 DIAGNOSIS — E118 Type 2 diabetes mellitus with unspecified complications: Secondary | ICD-10-CM

## 2016-05-04 DIAGNOSIS — R509 Fever, unspecified: Secondary | ICD-10-CM | POA: Diagnosis not present

## 2016-05-04 DIAGNOSIS — N182 Chronic kidney disease, stage 2 (mild): Secondary | ICD-10-CM | POA: Diagnosis present

## 2016-05-04 DIAGNOSIS — Z96651 Presence of right artificial knee joint: Secondary | ICD-10-CM | POA: Diagnosis present

## 2016-05-04 DIAGNOSIS — Z89422 Acquired absence of other left toe(s): Secondary | ICD-10-CM

## 2016-05-04 DIAGNOSIS — E1165 Type 2 diabetes mellitus with hyperglycemia: Secondary | ICD-10-CM

## 2016-05-04 DIAGNOSIS — Z8249 Family history of ischemic heart disease and other diseases of the circulatory system: Secondary | ICD-10-CM

## 2016-05-04 DIAGNOSIS — I130 Hypertensive heart and chronic kidney disease with heart failure and stage 1 through stage 4 chronic kidney disease, or unspecified chronic kidney disease: Secondary | ICD-10-CM | POA: Diagnosis present

## 2016-05-04 DIAGNOSIS — R300 Dysuria: Secondary | ICD-10-CM | POA: Diagnosis present

## 2016-05-04 DIAGNOSIS — E785 Hyperlipidemia, unspecified: Secondary | ICD-10-CM | POA: Diagnosis present

## 2016-05-04 DIAGNOSIS — M86179 Other acute osteomyelitis, unspecified ankle and foot: Secondary | ICD-10-CM | POA: Diagnosis present

## 2016-05-04 DIAGNOSIS — I10 Essential (primary) hypertension: Secondary | ICD-10-CM | POA: Diagnosis not present

## 2016-05-04 DIAGNOSIS — E1122 Type 2 diabetes mellitus with diabetic chronic kidney disease: Secondary | ICD-10-CM | POA: Diagnosis present

## 2016-05-04 DIAGNOSIS — Z791 Long term (current) use of non-steroidal anti-inflammatories (NSAID): Secondary | ICD-10-CM

## 2016-05-04 DIAGNOSIS — E1169 Type 2 diabetes mellitus with other specified complication: Secondary | ICD-10-CM | POA: Diagnosis not present

## 2016-05-04 DIAGNOSIS — K219 Gastro-esophageal reflux disease without esophagitis: Secondary | ICD-10-CM | POA: Diagnosis not present

## 2016-05-04 DIAGNOSIS — G4733 Obstructive sleep apnea (adult) (pediatric): Secondary | ICD-10-CM | POA: Diagnosis present

## 2016-05-04 DIAGNOSIS — G8929 Other chronic pain: Secondary | ICD-10-CM | POA: Diagnosis present

## 2016-05-04 DIAGNOSIS — Z7902 Long term (current) use of antithrombotics/antiplatelets: Secondary | ICD-10-CM

## 2016-05-04 DIAGNOSIS — E1140 Type 2 diabetes mellitus with diabetic neuropathy, unspecified: Secondary | ICD-10-CM

## 2016-05-04 DIAGNOSIS — Z87891 Personal history of nicotine dependence: Secondary | ICD-10-CM

## 2016-05-04 DIAGNOSIS — E119 Type 2 diabetes mellitus without complications: Secondary | ICD-10-CM

## 2016-05-04 DIAGNOSIS — Z79899 Other long term (current) drug therapy: Secondary | ICD-10-CM

## 2016-05-04 DIAGNOSIS — J449 Chronic obstructive pulmonary disease, unspecified: Secondary | ICD-10-CM | POA: Diagnosis present

## 2016-05-04 DIAGNOSIS — I2510 Atherosclerotic heart disease of native coronary artery without angina pectoris: Secondary | ICD-10-CM | POA: Diagnosis present

## 2016-05-04 DIAGNOSIS — I5032 Chronic diastolic (congestive) heart failure: Secondary | ICD-10-CM | POA: Diagnosis present

## 2016-05-04 DIAGNOSIS — R062 Wheezing: Secondary | ICD-10-CM

## 2016-05-04 DIAGNOSIS — J440 Chronic obstructive pulmonary disease with acute lower respiratory infection: Secondary | ICD-10-CM | POA: Diagnosis present

## 2016-05-04 DIAGNOSIS — L8990 Pressure ulcer of unspecified site, unspecified stage: Secondary | ICD-10-CM | POA: Insufficient documentation

## 2016-05-04 DIAGNOSIS — M86672 Other chronic osteomyelitis, left ankle and foot: Secondary | ICD-10-CM

## 2016-05-04 DIAGNOSIS — B999 Unspecified infectious disease: Secondary | ICD-10-CM

## 2016-05-04 DIAGNOSIS — Z7982 Long term (current) use of aspirin: Secondary | ICD-10-CM

## 2016-05-04 DIAGNOSIS — M86172 Other acute osteomyelitis, left ankle and foot: Secondary | ICD-10-CM | POA: Diagnosis present

## 2016-05-04 DIAGNOSIS — Z981 Arthrodesis status: Secondary | ICD-10-CM

## 2016-05-04 DIAGNOSIS — I5030 Unspecified diastolic (congestive) heart failure: Secondary | ICD-10-CM | POA: Diagnosis present

## 2016-05-04 DIAGNOSIS — Z955 Presence of coronary angioplasty implant and graft: Secondary | ICD-10-CM

## 2016-05-04 DIAGNOSIS — Z794 Long term (current) use of insulin: Secondary | ICD-10-CM

## 2016-05-04 LAB — CBC WITH DIFFERENTIAL/PLATELET
Basophils Absolute: 0 10*3/uL (ref 0.0–0.1)
Basophils Absolute: 0 10*3/uL (ref 0.0–0.1)
Basophils Relative: 0 %
Basophils Relative: 0 %
Eosinophils Absolute: 0.1 10*3/uL (ref 0.0–0.7)
Eosinophils Absolute: 0.1 10*3/uL (ref 0.0–0.7)
Eosinophils Relative: 1 %
Eosinophils Relative: 1 %
HCT: 30.8 % — ABNORMAL LOW (ref 39.0–52.0)
HCT: 30.7 % — ABNORMAL LOW (ref 39.0–52.0)
Hemoglobin: 10.5 g/dL — ABNORMAL LOW (ref 13.0–17.0)
Hemoglobin: 10.3 g/dL — ABNORMAL LOW (ref 13.0–17.0)
Lymphocytes Relative: 11 %
Lymphocytes Relative: 15 %
Lymphs Abs: 1.1 10*3/uL (ref 0.7–4.0)
Lymphs Abs: 1.3 10*3/uL (ref 0.7–4.0)
MCH: 27.6 pg (ref 26.0–34.0)
MCH: 27.3 pg (ref 26.0–34.0)
MCHC: 34.1 g/dL (ref 30.0–36.0)
MCHC: 33.6 g/dL (ref 30.0–36.0)
MCV: 80.8 fL (ref 78.0–100.0)
MCV: 81.4 fL (ref 78.0–100.0)
Monocytes Absolute: 0.9 10*3/uL (ref 0.1–1.0)
Monocytes Absolute: 0.7 10*3/uL (ref 0.1–1.0)
Monocytes Relative: 9 %
Monocytes Relative: 8 %
Neutro Abs: 7.6 10*3/uL (ref 1.7–7.7)
Neutro Abs: 6.8 10*3/uL (ref 1.7–7.7)
Neutrophils Relative %: 79 %
Neutrophils Relative %: 76 %
Platelets: 310 10*3/uL (ref 150–400)
Platelets: 320 10*3/uL (ref 150–400)
RBC: 3.81 MIL/uL — ABNORMAL LOW (ref 4.22–5.81)
RBC: 3.77 MIL/uL — ABNORMAL LOW (ref 4.22–5.81)
RDW: 12.6 % (ref 11.5–15.5)
RDW: 12.6 % (ref 11.5–15.5)
WBC: 9.6 10*3/uL (ref 4.0–10.5)
WBC: 8.9 10*3/uL (ref 4.0–10.5)

## 2016-05-04 LAB — BASIC METABOLIC PANEL
Anion gap: 5 (ref 5–15)
BUN: 9 mg/dL (ref 6–20)
CO2: 27 mmol/L (ref 22–32)
Calcium: 8.7 mg/dL — ABNORMAL LOW (ref 8.9–10.3)
Chloride: 101 mmol/L (ref 101–111)
Creatinine, Ser: 1.04 mg/dL (ref 0.61–1.24)
GFR calc Af Amer: >60 mL/min (ref >60)
GFR calc non Af Amer: >60 mL/min (ref >60)
Glucose, Bld: 101 mg/dL — ABNORMAL HIGH (ref 65–99)
Potassium: 3.6 mmol/L (ref 3.5–5.1)
Sodium: 133 mmol/L — ABNORMAL LOW (ref 135–145)

## 2016-05-04 LAB — URINALYSIS, ROUTINE W REFLEX MICROSCOPIC
Bilirubin Urine: NEGATIVE
Glucose, UA: 100 mg/dL — AB
Hgb urine dipstick: NEGATIVE
Ketones, ur: NEGATIVE mg/dL
Leukocytes, UA: NEGATIVE
Nitrite: NEGATIVE
Protein, ur: 100 mg/dL — AB
Specific Gravity, Urine: 1.014 (ref 1.005–1.030)
pH: 5.5 (ref 5.0–8.0)

## 2016-05-04 LAB — COMPREHENSIVE METABOLIC PANEL
ALT: 12 U/L — ABNORMAL LOW (ref 17–63)
AST: 16 U/L (ref 15–41)
Albumin: 2.7 g/dL — ABNORMAL LOW (ref 3.5–5.0)
Alkaline Phosphatase: 107 U/L (ref 38–126)
Anion gap: 8 (ref 5–15)
BUN: 11 mg/dL (ref 6–20)
CO2: 27 mmol/L (ref 22–32)
Calcium: 8.8 mg/dL — ABNORMAL LOW (ref 8.9–10.3)
Chloride: 100 mmol/L — ABNORMAL LOW (ref 101–111)
Creatinine, Ser: 1.2 mg/dL (ref 0.61–1.24)
GFR calc Af Amer: >60 mL/min (ref >60)
GFR calc non Af Amer: >60 mL/min (ref >60)
Glucose, Bld: 157 mg/dL — ABNORMAL HIGH (ref 65–99)
Potassium: 3.9 mmol/L (ref 3.5–5.1)
Sodium: 135 mmol/L (ref 135–145)
Total Bilirubin: 0.6 mg/dL (ref 0.3–1.2)
Total Protein: 6.2 g/dL — ABNORMAL LOW (ref 6.5–8.1)

## 2016-05-04 LAB — SEDIMENTATION RATE: Sed Rate: 98 mm/hr — ABNORMAL HIGH (ref 0–16)

## 2016-05-04 LAB — URINE MICROSCOPIC-ADD ON
RBC / HPF: NONE SEEN RBC/hpf (ref 0–5)
Squamous Epithelial / LPF: NONE SEEN
WBC, UA: NONE SEEN WBC/hpf (ref 0–5)

## 2016-05-04 LAB — C-REACTIVE PROTEIN: CRP: 8 mg/dL — ABNORMAL HIGH (ref <1.0)

## 2016-05-04 LAB — GLUCOSE, CAPILLARY: Glucose-Capillary: 165 mg/dL — ABNORMAL HIGH (ref 65–99)

## 2016-05-04 LAB — PREALBUMIN: Prealbumin: 19 mg/dL (ref 18–38)

## 2016-05-04 MED ORDER — METRONIDAZOLE IN NACL 5-0.79 MG/ML-% IV SOLN
500.0000 mg | Freq: Three times a day (TID) | INTRAVENOUS | Status: DC
  Administered 2016-05-04: 500 mg via INTRAVENOUS
  Filled 2016-05-04: qty 100

## 2016-05-04 MED ORDER — ATORVASTATIN CALCIUM 40 MG PO TABS
40.0000 mg | ORAL_TABLET | Freq: Every day | ORAL | Status: DC
  Administered 2016-05-05 – 2016-05-11 (×7): 40 mg via ORAL
  Filled 2016-05-04 (×7): qty 1

## 2016-05-04 MED ORDER — SODIUM CHLORIDE 0.9 % IV BOLUS (SEPSIS)
1000.0000 mL | Freq: Once | INTRAVENOUS | Status: AC
  Administered 2016-05-04: 1000 mL via INTRAVENOUS

## 2016-05-04 MED ORDER — OXYCODONE-ACETAMINOPHEN 5-325 MG PO TABS
1.0000 | ORAL_TABLET | Freq: Three times a day (TID) | ORAL | Status: DC | PRN
  Administered 2016-05-04 – 2016-05-11 (×14): 1 via ORAL
  Filled 2016-05-04 (×15): qty 1

## 2016-05-04 MED ORDER — CLOBETASOL PROPIONATE 0.05 % EX CREA
1.0000 "application " | TOPICAL_CREAM | Freq: Every day | CUTANEOUS | Status: DC | PRN
  Filled 2016-05-04: qty 15

## 2016-05-04 MED ORDER — NAPROXEN SODIUM 275 MG PO TABS
275.0000 mg | ORAL_TABLET | Freq: Two times a day (BID) | ORAL | Status: DC
  Administered 2016-05-05 – 2016-05-11 (×14): 275 mg via ORAL
  Filled 2016-05-04 (×16): qty 1

## 2016-05-04 MED ORDER — AMLODIPINE-OLMESARTAN 5-20 MG PO TABS: 1.0000 | ORAL_TABLET | Freq: Every day | ORAL | Status: DC

## 2016-05-04 MED ORDER — COLLAGENASE 250 UNIT/GM EX OINT
TOPICAL_OINTMENT | Freq: Every day | CUTANEOUS | Status: DC
  Administered 2016-05-05 – 2016-05-11 (×5): via TOPICAL
  Filled 2016-05-04 (×2): qty 30

## 2016-05-04 MED ORDER — ONDANSETRON 4 MG PO TBDP
4.0000 mg | ORAL_TABLET | Freq: Four times a day (QID) | ORAL | Status: DC | PRN
Start: 2016-05-04 — End: 2016-05-11

## 2016-05-04 MED ORDER — DEXTROSE 5 % IV SOLN
2.0000 g | Freq: Two times a day (BID) | INTRAVENOUS | Status: DC
  Administered 2016-05-04 – 2016-05-11 (×14): 2 g via INTRAVENOUS
  Filled 2016-05-04 (×16): qty 2

## 2016-05-04 MED ORDER — NITROGLYCERIN 2 % TD OINT
0.7500 [in_us] | TOPICAL_OINTMENT | Freq: Every day | TRANSDERMAL | Status: DC | PRN
  Filled 2016-05-04: qty 30

## 2016-05-04 MED ORDER — ALPRAZOLAM 0.25 MG PO TABS
0.2500 mg | ORAL_TABLET | Freq: Every day | ORAL | Status: DC
  Administered 2016-05-04 – 2016-05-11 (×8): 0.25 mg via ORAL
  Filled 2016-05-04 (×8): qty 1

## 2016-05-04 MED ORDER — INSULIN GLULISINE 100 UNIT/ML SOLOSTAR PEN: 10.0000 [IU] | PEN_INJECTOR | Freq: Three times a day (TID) | SUBCUTANEOUS | Status: DC

## 2016-05-04 MED ORDER — INSULIN GLARGINE 100 UNITS/ML SOLOSTAR PEN
40.0000 [IU] | PEN_INJECTOR | Freq: Every day | SUBCUTANEOUS | Status: DC
  Filled 2016-05-04: qty 3

## 2016-05-04 MED ORDER — NITROGLYCERIN 0.4 MG SL SUBL: 0.4000 mg | SUBLINGUAL_TABLET | SUBLINGUAL | Status: DC | PRN

## 2016-05-04 MED ORDER — AMLODIPINE BESYLATE 5 MG PO TABS
5.0000 mg | ORAL_TABLET | Freq: Every day | ORAL | Status: DC
  Administered 2016-05-04 – 2016-05-10 (×7): 5 mg via ORAL
  Filled 2016-05-04 (×7): qty 1

## 2016-05-04 MED ORDER — SODIUM CHLORIDE 0.9 % IV SOLN: 250.0000 mL | INTRAVENOUS | Status: DC | PRN

## 2016-05-04 MED ORDER — INSULIN ASPART 100 UNIT/ML ~~LOC~~ SOLN
0.0000 [IU] | Freq: Three times a day (TID) | SUBCUTANEOUS | Status: DC
  Administered 2016-05-05: 3 [IU] via SUBCUTANEOUS
  Administered 2016-05-05: 5 [IU] via SUBCUTANEOUS
  Administered 2016-05-06: 9 [IU] via SUBCUTANEOUS
  Administered 2016-05-06 (×2): 5 [IU] via SUBCUTANEOUS
  Administered 2016-05-07: 2 [IU] via SUBCUTANEOUS
  Administered 2016-05-07 – 2016-05-08 (×4): 5 [IU] via SUBCUTANEOUS
  Administered 2016-05-08: 2 [IU] via SUBCUTANEOUS
  Administered 2016-05-09: 3 [IU] via SUBCUTANEOUS
  Administered 2016-05-09: 2 [IU] via SUBCUTANEOUS

## 2016-05-04 MED ORDER — ACETAMINOPHEN 650 MG RE SUPP: 650.0000 mg | Freq: Four times a day (QID) | RECTAL | Status: DC | PRN

## 2016-05-04 MED ORDER — PREGABALIN 100 MG PO CAPS
100.0000 mg | ORAL_CAPSULE | Freq: Three times a day (TID) | ORAL | Status: DC
  Administered 2016-05-04 – 2016-05-11 (×22): 100 mg via ORAL
  Filled 2016-05-04 (×17): qty 1
  Filled 2016-05-04: qty 2
  Filled 2016-05-04 (×4): qty 1

## 2016-05-04 MED ORDER — TORSEMIDE 20 MG PO TABS
40.0000 mg | ORAL_TABLET | Freq: Every day | ORAL | Status: DC
  Administered 2016-05-05 – 2016-05-11 (×7): 40 mg via ORAL
  Filled 2016-05-04 (×8): qty 2

## 2016-05-04 MED ORDER — INSULIN ASPART 100 UNIT/ML ~~LOC~~ SOLN
0.0000 [IU] | Freq: Every day | SUBCUTANEOUS | Status: DC
  Administered 2016-05-05 – 2016-05-07 (×3): 4 [IU] via SUBCUTANEOUS
  Administered 2016-05-08: 3 [IU] via SUBCUTANEOUS

## 2016-05-04 MED ORDER — ADULT MULTIVITAMIN W/MINERALS CH
1.0000 | ORAL_TABLET | Freq: Every day | ORAL | Status: DC
  Administered 2016-05-05 – 2016-05-11 (×7): 1 via ORAL
  Filled 2016-05-04 (×7): qty 1

## 2016-05-04 MED ORDER — FLUTICASONE PROPIONATE 0.005 % EX OINT
1.0000 "application " | TOPICAL_OINTMENT | Freq: Two times a day (BID) | CUTANEOUS | Status: DC | PRN
  Filled 2016-05-04: qty 30

## 2016-05-04 MED ORDER — CLOPIDOGREL BISULFATE 75 MG PO TABS
75.0000 mg | ORAL_TABLET | Freq: Every day | ORAL | Status: DC
  Administered 2016-05-05 – 2016-05-11 (×7): 75 mg via ORAL
  Filled 2016-05-04 (×7): qty 1

## 2016-05-04 MED ORDER — LINACLOTIDE 145 MCG PO CAPS
145.0000 ug | ORAL_CAPSULE | Freq: Every day | ORAL | Status: DC | PRN
  Filled 2016-05-04: qty 1

## 2016-05-04 MED ORDER — EZETIMIBE 10 MG PO TABS
10.0000 mg | ORAL_TABLET | Freq: Every day | ORAL | Status: DC
  Administered 2016-05-05 – 2016-05-11 (×7): 10 mg via ORAL
  Filled 2016-05-04 (×7): qty 1

## 2016-05-04 MED ORDER — METRONIDAZOLE 500 MG PO TABS
500.0000 mg | ORAL_TABLET | Freq: Three times a day (TID) | ORAL | Status: DC
  Administered 2016-05-04 – 2016-05-11 (×20): 500 mg via ORAL
  Filled 2016-05-04 (×20): qty 1

## 2016-05-04 MED ORDER — SODIUM CHLORIDE 0.9% FLUSH
3.0000 mL | Freq: Two times a day (BID) | INTRAVENOUS | Status: DC
  Administered 2016-05-04 – 2016-05-11 (×9): 3 mL via INTRAVENOUS

## 2016-05-04 MED ORDER — CARVEDILOL 6.25 MG PO TABS
6.2500 mg | ORAL_TABLET | Freq: Every day | ORAL | Status: DC
  Administered 2016-05-04 – 2016-05-10 (×7): 6.25 mg via ORAL
  Filled 2016-05-04 (×7): qty 1

## 2016-05-04 MED ORDER — ISOSORBIDE MONONITRATE ER 60 MG PO TB24
120.0000 mg | ORAL_TABLET | Freq: Every day | ORAL | Status: DC
  Administered 2016-05-05 – 2016-05-11 (×7): 120 mg via ORAL
  Filled 2016-05-04 (×8): qty 2

## 2016-05-04 MED ORDER — VANCOMYCIN HCL 10 G IV SOLR
2000.0000 mg | Freq: Once | INTRAVENOUS | Status: AC
  Administered 2016-05-04: 2000 mg via INTRAVENOUS
  Filled 2016-05-04: qty 2000

## 2016-05-04 MED ORDER — IRBESARTAN 150 MG PO TABS
150.0000 mg | ORAL_TABLET | Freq: Every day | ORAL | Status: DC
  Administered 2016-05-04 – 2016-05-10 (×7): 150 mg via ORAL
  Filled 2016-05-04 (×8): qty 1

## 2016-05-04 MED ORDER — VANCOMYCIN HCL IN DEXTROSE 750-5 MG/150ML-% IV SOLN
750.0000 mg | Freq: Two times a day (BID) | INTRAVENOUS | Status: DC
  Administered 2016-05-05 – 2016-05-11 (×13): 750 mg via INTRAVENOUS
  Filled 2016-05-04 (×16): qty 150

## 2016-05-04 MED ORDER — HYDROMORPHONE HCL 2 MG PO TABS
2.0000 mg | ORAL_TABLET | Freq: Every day | ORAL | Status: DC
  Administered 2016-05-04 – 2016-05-10 (×7): 2 mg via ORAL
  Filled 2016-05-04 (×7): qty 1

## 2016-05-04 MED ORDER — SODIUM CHLORIDE 0.9% FLUSH: 3.0000 mL | INTRAVENOUS | Status: DC | PRN

## 2016-05-04 MED ORDER — MELOXICAM 7.5 MG PO TABS
15.0000 mg | ORAL_TABLET | Freq: Every day | ORAL | Status: DC
  Administered 2016-05-04 – 2016-05-10 (×7): 15 mg via ORAL
  Filled 2016-05-04 (×4): qty 1
  Filled 2016-05-04 (×2): qty 2
  Filled 2016-05-04 (×3): qty 1

## 2016-05-04 MED ORDER — ENOXAPARIN SODIUM 40 MG/0.4ML ~~LOC~~ SOLN
40.0000 mg | SUBCUTANEOUS | Status: DC
  Administered 2016-05-04 – 2016-05-10 (×7): 40 mg via SUBCUTANEOUS
  Filled 2016-05-04 (×7): qty 0.4

## 2016-05-04 MED ORDER — FENTANYL 12 MCG/HR TD PT72: 50.0000 ug | MEDICATED_PATCH | TRANSDERMAL | Status: DC

## 2016-05-04 MED ORDER — OMEGA-3-ACID ETHYL ESTERS 1 G PO CAPS
2.0000 g | ORAL_CAPSULE | Freq: Two times a day (BID) | ORAL | Status: DC
  Administered 2016-05-04 – 2016-05-11 (×14): 2 g via ORAL
  Filled 2016-05-04 (×16): qty 2

## 2016-05-04 MED ORDER — MORPHINE SULFATE (PF) 4 MG/ML IV SOLN
4.0000 mg | Freq: Once | INTRAVENOUS | Status: AC
  Administered 2016-05-04: 4 mg via INTRAVENOUS
  Filled 2016-05-04: qty 1

## 2016-05-04 MED ORDER — ROPINIROLE HCL 1 MG PO TABS
2.0000 mg | ORAL_TABLET | Freq: Every day | ORAL | Status: DC
  Administered 2016-05-04 – 2016-05-10 (×7): 2 mg via ORAL
  Filled 2016-05-04 (×7): qty 2

## 2016-05-04 MED ORDER — TAMSULOSIN HCL 0.4 MG PO CAPS
0.4000 mg | ORAL_CAPSULE | Freq: Every day | ORAL | Status: DC
  Administered 2016-05-05 – 2016-05-11 (×7): 0.4 mg via ORAL
  Filled 2016-05-04 (×7): qty 1

## 2016-05-04 MED ORDER — INSULIN GLARGINE 100 UNIT/ML ~~LOC~~ SOLN
40.0000 [IU] | Freq: Every day | SUBCUTANEOUS | Status: DC
  Administered 2016-05-04 – 2016-05-05 (×2): 40 [IU] via SUBCUTANEOUS
  Filled 2016-05-04 (×4): qty 0.4

## 2016-05-04 MED ORDER — ACETAMINOPHEN 325 MG PO TABS: 650.0000 mg | ORAL_TABLET | Freq: Four times a day (QID) | ORAL | Status: DC | PRN

## 2016-05-04 MED ORDER — ASPIRIN EC 81 MG PO TBEC
81.0000 mg | DELAYED_RELEASE_TABLET | Freq: Every day | ORAL | Status: DC
  Administered 2016-05-05 – 2016-05-11 (×7): 81 mg via ORAL
  Filled 2016-05-04 (×9): qty 1

## 2016-05-04 MED ORDER — CARVEDILOL 25 MG PO TABS
25.0000 mg | ORAL_TABLET | Freq: Every day | ORAL | Status: DC
  Administered 2016-05-05 – 2016-05-09 (×5): 25 mg via ORAL
  Administered 2016-05-09: 6.25 mg via ORAL
  Administered 2016-05-10 – 2016-05-11 (×2): 25 mg via ORAL
  Filled 2016-05-04 (×8): qty 1

## 2016-05-04 NOTE — ED Triage Notes (Signed)
Pt here from home with c/o n/v/d , that started last night , pt had foot surgery 1 week ago

## 2016-05-04 NOTE — Progress Notes (Signed)
Pharmacy Antibiotic Note  [name] is a 60 y.o. male admitted on 05/04/2016 with suspected diabetic foot infection.  Patient is afebrile, WBCs normal. SCr 1.20. Pt recently discharged following left foot surgery for osteomyelitis on 04/25/16. Patient started on Flagyl 500mg  IVq8hours in ED. Pharmacy has been consulted for Cefepime and Vancomycin dosing.  Plan: Load Vancomycin 2000 mg IV once. Continue Vancomycin 750 mg IV q 12 hours. Cefepime 2 g IV q12 hours     Temp (24hrs), Avg:99.8 F (37.7 C), Min:99.7 F (37.6 C), Max:99.9 F (37.7 C)   Recent Labs Lab 05/04/16 1250  WBC 9.6  CREATININE 1.20    Estimated Creatinine Clearance: 86.6 mL/min (by C-G formula based on SCr of 1.2 mg/dL).    Allergies  Allergen Reactions  . Ivp Dye [Iodinated Diagnostic Agents] Anaphylaxis and Other (See Comments)    Breathing problems   . Adhesive [Tape] Rash    Rash after 2-3 days use    Antimicrobials this admission: 8/25 Vancomycin, Cefepime, Metronidazole  Microbiology results: Cdiff: Pending  Thank you for allowing pharmacy to be a part of this patient's care.  Curt Bears Octavius Shin 05/04/2016 4:07 PM

## 2016-05-04 NOTE — ED Notes (Signed)
Patient reports n/v/d that started yesterday while at doctors office. Patient had surgery on left lateral aspect of foot 8/16. Patient has had home health wound care. Badge removed. Mild swelling noted but patient states this is better than it has been. Mild odor from foot noted. Serosanguinous fluid noted from suture sites. Patient reports 9/10 pain.

## 2016-05-04 NOTE — ED Provider Notes (Signed)
[facility] DEPT Provider Note   CSN: [id number] Arrival date & time: 05/04/16  1145     History   Chief Complaint Chief Complaint  Patient presents with  . Nausea  . Emesis    HPI [name] is a 60 y.o. male.  Patient presents emergency department with chief complaint of nausea, vomiting, diarrhea. States that he noticed the symptoms 3 days ago. Reports associated low-grade fever to 100. He states that he recently had surgery on his left foot for osteomyelitis by [name]. He states that he was taking antibiotics for this, but was taken off of the antibiotics on Monday of this week when he saw [name]. The surgery was on 04/25/16. He states that he does have some lower abdominal tenderness bilaterally. He denies prior abdominal surgeries. He has not taken anything for her symptoms. He states that his primary care doctor is concerned about his kidneys, and sent him to the emergency department for further evaluation.   The history is provided by the patient. No language interpreter was used.    Past Medical History:  Diagnosis Date  . Barrett's esophagus   . Carotid artery occlusion 11/10/10   LEFT CAROTID ENDARTERECTOMY  . CHF (congestive heart failure) ([place])   . Complication of anesthesia    BP WENT UP AT [facility] "  . COPD (chronic obstructive pulmonary disease) ([place])   . Coronary artery disease   . Diabetes mellitus   . Diverticulosis of colon (without mention of hemorrhage)   . DJD (degenerative joint disease)   . Emphysema of lung ([place])   . Fatty liver   . Full dentures   . GERD (gastroesophageal reflux disease)   . H/O Doppler ultrasound 06/28/2012   Lower venous duplex left-- findings consistent with acute deep vein thrombosis involving the personal vein of the left lower extremity. No evidence of Baker's cyst on the left. Duplex scan of the left lower extremity arteries reveal adequate blood flow.   . H/O hiatal hernia   . History of cardiovascular stress test  03/23/2011   ejection fraction measured 51%, with an end-diastolic volume of 123456 ml and an end-systolic volume of 81 ml. no evidence of myocardial ischemia.  Marland Kitchen History of echocardiogram 08/22/2011   Mildly hypertrophic left ventricle with normal systolic function and mild diastolic dysfunction. mildly dilated left atrium. no significant valvular abnormalities. no pericardial effusion no change from previous study 10/2010. the ejection fraction =>55%  . History of Holter monitoring 02/14/2012   Scan quality is fair due to baseline artifact. Predominant rhythm is Sinus. Veentricular Ectopy was noted in single beat form. The total recording time was 24:00 hours. total beats =104061. the total time analyzed was 23:58 hours. Heart  Max = 102 at 1:18:22, Min= 52 at 9:56:19 am., Avg= 70. There were 0 pause(s) > 2.5 seconds. the longest pause = 0.00 sec. at 1:51 pm(1)  . Hyperlipidemia   . Hypertension   . Neuromuscular disorder ([place])    peripheral neuropathy  . OSA (obstructive sleep apnea) 12/26/08   AHI of 6.3/hr overall, 32.7/hr during REM sleep and an RDI of 24.8/hr and 50.9/hr.  . Osteomyelitis ([place])   . Osteomyelitis ([place])    left 5th metatarsal  . PAD (peripheral artery disease) ([place])    Distal aortogram June 2012. Atherectomy left popliteal artery July 2012.   . Sleep apnea    has not used CPAP in e years - took self off  . Slurred speech    AS  PER WIFE IN D/C NOTE 11/10/10  . Substernal chest pain   . Visual color changes    LEFT EYE; BUT NOT TOTAL BLINDNESS  . Wears glasses     Patient Active Problem List   Diagnosis Date Noted  . Osteomyelitis of ankle or foot, acute ([place]) 04/25/2016  . Type 2 diabetes mellitus with insulin therapy ([place]) 12/16/2015  . Amputated toe ([place]) 10/21/2015  . Diabetic foot ulcer ([place]) 10/12/2015  . Cellulitis of left foot 09/03/2015  . Leg edema, left 09/03/2015  . Acute renal failure ([place]) 09/03/2015  . CAD (dz of distal, mid and proximal RCA with  implantation of 3 overlapping drug-eluting stent,) 09/03/2015  . Chronic diastolic heart failure, NYHA class 2 ([place]) 09/03/2015  . Opacity of lung on imaging study 01/14/2015  . Angina pectoris associated with type 2 diabetes mellitus ([place]) 10/28/2014  . Hyperlipidemia 08/25/2014  . Chronic total occlusion of artery of the extremities ([place]) 04/08/2012  . Onychomycosis 02/01/2012  . Diabetes mellitus, insulin dependent (IDDM), controlled ([place]) 11/01/2011  . Occlusion and stenosis of carotid artery without mention of cerebral infarction 06/12/2011  . Aftercare following surgery of the circulatory system, [place] 06/12/2011  . GERD (gastroesophageal reflux disease) 05/08/2011  . Barrett's esophagus without dysplasia 05/08/2011  . PVD (peripheral vascular disease) ([place]) 02/01/2011  . HTN (hypertension) 02/01/2011  . Former tobacco use 02/01/2011    Past Surgical History:  Procedure Laterality Date  . AMPUTATION  11/05/2011   Procedure: AMPUTATION RAY;  Surgeon: Wylene Simmer, MD;  Location: [facility];  Service: Orthopedics;  Laterality: Right;  Amputation of Right 4&5th Toes  . AMPUTATION Left 11/26/2012   Procedure: AMPUTATION RAY;  Surgeon: Wylene Simmer, MD;  Location: [facility];  Service: Orthopedics;  Laterality: Left;  fourth ray amputation  . AMPUTATION Right 08/27/2014   Procedure: Transmetatarsal Amputation;  Surgeon: Newt Minion, MD;  Location: [facility];  Service: Orthopedics;  Laterality: Right;  . AMPUTATION Right 01/14/2015   Procedure: AMPUTATION BELOW KNEE;  Surgeon: Newt Minion, MD;  Location: [facility];  Service: Orthopedics;  Laterality: Right;  . AMPUTATION Left 10/21/2015   Procedure: Left Foot 5th Ray Amputation;  Surgeon: Newt Minion, MD;  Location: [facility];  Service: Orthopedics;  Laterality: Left;  . ANTERIOR FUSION CERVICAL SPINE  02/06/06   C4-5, C5-6, C6-7; SURGEON DR. MAX COHEN  . CARDIAC CATHETERIZATION  10/31/04   2009  . CAROTID ENDARTERECTOMY  11/10/10  . CAROTID ENDARTERECTOMY Left  11/10/2010   Subtotal occlusion of left internal carotid artery with left hemispheric transient ischemic attacks.  . CARPAL TUNNEL RELEASE Right 10/21/2013   Procedure: RIGHT CARPAL TUNNEL RELEASE;  Surgeon: Wynonia Sours, MD;  Location: [facility];  Service: Orthopedics;  Laterality: Right;  . CHOLECYSTECTOMY    . COLONOSCOPY    . ESOPHAGEAL MANOMETRY Bilateral 07/19/2014   Procedure: ESOPHAGEAL MANOMETRY (EM);  Surgeon: Jerene Bears, MD;  Location: [facility];  Service: Gastroenterology;  Laterality: Bilateral;  . FEMORAL ARTERY STENT     x6  . FOOT SURGERY  04/25/2016    EXCISION BASE 5TH METATARSAL AND PARTIAL CUBOID LEFT FOOT  . HERNIA REPAIR     LEFT INGUINAL AND UMBILICAL REPAIRS  . I&D EXTREMITY Left 04/25/2016   Procedure: EXCISION BASE 5TH METATARSAL AND PARTIAL CUBOID LEFT FOOT;  Surgeon: Newt Minion, MD;  Location: [facility];  Service: Orthopedics;  Laterality: Left;  . JOINT REPLACEMENT Right 2001   Total  . LAMINECTOMY  X 3 LUMBAR AND X 2 CERVICAL SPINE OPERATIONS  . LAPAROSCOPIC CHOLECYSTECTOMY W/ CHOLANGIOGRAPHY  11/09/04   SURGEON DR. Luella Cook  . LEFT HEART CATHETERIZATION WITH CORONARY ANGIOGRAM N/A 10/29/2014   Procedure: LEFT HEART CATHETERIZATION WITH CORONARY ANGIOGRAM;  Surgeon: Laverda Page, MD;  Location: [facility] CATH LAB;  Service: Cardiovascular;  Laterality: N/A;  . LOWER EXTREMITY ANGIOGRAM N/A 03/19/2012   Procedure: LOWER EXTREMITY ANGIOGRAM;  Surgeon: Burnell Blanks, MD;  Location[facility];  Service: Cardiovascular;  Laterality: N/A;  . NECK SURGERY    . PENILE PROSTHESIS IMPLANT  08/14/05   INFRAPUBIC INSERTION OF INFLATABLE PENILE PROSTHESIS; SURGEON DR. Amalia Hailey  . PERCUTANEOUS CORONARY STENT INTERVENTION (PCI-S) Right 10/29/2014   Procedure: PERCUTANEOUS CORONARY STENT INTERVENTION (PCI-S);  Surgeon: Laverda Page, MD;  Location: [facility] CATH LAB;  Service: Cardiovascular;  Laterality: Right;  . SHOULDER ARTHROSCOPY    . SPINE  SURGERY    . TONSILLECTOMY    . TOTAL KNEE ARTHROPLASTY  07/2002   RIGHT KNEE ; SURGEON  DR. [name] ALSO HAD ARTHROSCOPIC RIGHT KNEE IN  10/2001  . ULNAR NERVE TRANSPOSITION Right 10/21/2013   Procedure: RIGHT ELBOW  ULNAR NERVE DECOMPRESSION;  Surgeon: Wynonia Sours, MD;  Location: [facility];  Service: Orthopedics;  Laterality: Right;       Home Medications    Prior to Admission medications   Medication Sig Start Date End Date Taking? Authorizing Provider  ALPRAZolam (XANAX) 0.25 MG tablet Take 0.25 mg by mouth daily. 08/22/15   Historical Provider, MD  amLODipine-olmesartan (AZOR) 5-20 MG tablet Take 1 tablet by mouth at bedtime.    Historical Provider, MD  aspirin EC 81 MG tablet Take 81 mg by mouth daily.    Historical Provider, MD  atorvastatin (LIPITOR) 40 MG tablet Take 40 mg by mouth daily.    Historical Provider, MD  carvedilol (COREG) 25 MG tablet Take 25 mg by mouth daily. 08/18/15   Historical Provider, MD  carvedilol (COREG) 6.25 MG tablet Take 6.25 mg by mouth at bedtime.    Historical Provider, MD  clobetasol (TEMOVATE) 0.05 % external solution Apply 1 application topically daily as needed (dry scalp).    Historical Provider, MD  clopidogrel (PLAVIX) 75 MG tablet Take 75 mg by mouth daily.  03/28/12   Historical Provider, MD  clotrimazole-betamethasone (LOTRISONE) cream Apply 1 application topically daily as needed (dry skin - beard).     Historical Provider, MD  collagenase (SANTYL) ointment Apply topically daily. 09/06/15   Lavina Hamman, MD  doxycycline (VIBRA-TABS) 100 MG tablet Take 1 tablet (100 mg total) by mouth 2 (two) times daily. Patient taking differently: Take 100 mg by mouth 2 (two) times daily. 3 month course started 10/12/15 10/12/15   Campbell Riches, MD  eplerenone (INSPRA) 50 MG tablet Take 50 mg by mouth daily. 08/18/15   Historical Provider, MD  ezetimibe (ZETIA) 10 MG tablet Take 10 mg by mouth daily.    Historical Provider, MD  fentaNYL  (DURAGESIC - DOSED MCG/HR) 50 MCG/HR Place 1 patch (50 mcg total) onto the skin every 3 (three) days. 01/20/15   Florencia Reasons, MD  fluticasone (CUTIVATE) 0.05 % cream Apply 1 application topically 2 (two) times daily as needed (dry skin on face).     Historical Provider, MD  glyBURIDE-metformin (GLUCOVANCE) 2.5-500 MG per tablet Take 2 tablets by mouth 2 (two) times daily with a meal.  03/05/12   Historical Provider, MD  HYDROmorphone (DILAUDID) 2  MG tablet Take 2 mg by mouth at bedtime.    Historical Provider, MD  insulin glargine (LANTUS) 100 unit/mL SOPN Inject 40 Units into the skin at bedtime.    Historical Provider, MD  Insulin Glulisine (APIDRA) 100 UNIT/ML Solostar Pen Inject 10 Units into the skin 3 (three) times daily with meals.    Historical Provider, MD  isosorbide mononitrate (IMDUR) 120 MG 24 hr tablet Take 120 mg by mouth daily.    Historical Provider, MD  Linaclotide Rolan Lipa) 145 MCG CAPS capsule Take 1 capsule (145 mcg total) by mouth daily as needed. Patient taking differently: Take 145 mcg by mouth daily as needed (constipation).  01/20/15   Florencia Reasons, MD  meloxicam (MOBIC) 15 MG tablet Take 15 mg by mouth at bedtime.  07/12/15   Historical Provider, MD  Multiple Vitamin (MULTIVITAMIN WITH MINERALS) TABS tablet Take 1 tablet by mouth daily.    Historical Provider, MD  naproxen sodium (ALEVE) 220 MG tablet Take 220 mg by mouth 2 (two) times daily with a meal.    Historical Provider, MD  nitroGLYCERIN (NITRO-BID) 2 % ointment Apply 0.75 inches topically daily as needed (esophogeal spasms).     Historical Provider, MD  nitroGLYCERIN (NITROSTAT) 0.4 MG SL tablet Place 1 tablet (0.4 mg total) under the tongue every 5 (five) minutes as needed (as needed for esophageal spasm). Patient taking differently: Place 0.4 mg under the tongue every 5 (five) minutes as needed for chest pain.  08/09/14   Jerene Bears, MD  omega-3 acid ethyl esters (LOVAZA) 1 G capsule Take 2 g by mouth 2 (two) times daily.     Historical Provider, MD  ondansetron (ZOFRAN-ODT) 4 MG disintegrating tablet Take 4 mg by mouth 4 (four) times daily as needed for nausea.    Historical Provider, MD  oxyCODONE-acetaminophen (PERCOCET/ROXICET) 5-325 MG per tablet Take 1 tablet by mouth every 8 (eight) hours as needed. Patient taking differently: Take 1 tablet by mouth every 8 (eight) hours. scheduled 09/30/14   Thurnell Lose, MD  pantoprazole (PROTONIX) 40 MG tablet Take 1 tablet (40 mg total) by mouth daily before breakfast. 10/30/14   Adrian Prows, MD  Polyethyl Glycol-Propyl Glycol (SYSTANE ULTRA OP) Place 1 drop into both eyes as needed (for dry eyes).    Historical Provider, MD  pregabalin (LYRICA) 100 MG capsule Take 100 mg by mouth 3 (three) times daily.    Historical Provider, MD  rOPINIRole (REQUIP) 2 MG tablet Take 2 mg by mouth at bedtime.     Historical Provider, MD  Tamsulosin HCl (FLOMAX) 0.4 MG CAPS Take 0.4 mg by mouth daily.    Historical Provider, MD  torsemide (DEMADEX) 20 MG tablet Take 40 mg by mouth daily.     Historical Provider, MD    Family History Family History  Problem Relation Age of Onset  . Heart disease Father     Before age 53-  CAD, BPG  . Diabetes Father     Amputation  . Cancer Father     PROSTATE  . Hyperlipidemia Father   . Hypertension Father   . Heart attack Father     Triple BPG  . Varicose Veins Father   . Colon cancer Brother   . Cancer Brother     Colon  . Diabetes Brother   . Heart disease Brother 5    A-Fib. Before age 74  . Hyperlipidemia Brother   . Hypertension Brother   . Cancer Sister     Breast  .  Hyperlipidemia Sister   . Hypertension Sister   . Hypertension Son   . Arthritis      GRANDMOTHER  . Hypertension      OTHER FAMILY MEMBERS    Social History Social History  Substance Use Topics  . Smoking status: Former Smoker    Packs/day: 0.50    Years: 35.00    Types: E-cigarettes    Quit date: 10/28/2011  . Smokeless tobacco: Never Used     Comment:  VAPOR CIGARETTES  . Alcohol use 0.0 oz/week     Comment: rare     Allergies   Ivp dye [iodinated diagnostic agents] and Adhesive [tape]   Review of Systems Review of Systems  Gastrointestinal: Positive for diarrhea, nausea and vomiting.  All other systems reviewed and are negative.    Physical Exam Updated Vital Signs BP 137/69 (BP Location: Right Arm)   Pulse 69   Temp 99.9 F (37.7 C) (Oral)   Resp 21   SpO2 99%   Physical Exam  Constitutional: He is oriented to person, place, and time. He appears well-developed and well-nourished.  HENT:  Head: Normocephalic and atraumatic.  Eyes: Conjunctivae and EOM are normal. Pupils are equal, round, and reactive to light. Right eye exhibits no discharge. Left eye exhibits no discharge. No scleral icterus.  Neck: Normal range of motion. Neck supple. No JVD present.  Cardiovascular: Normal rate, regular rhythm and normal heart sounds.  Exam reveals no gallop and no friction rub.   No murmur heard. Pulmonary/Chest: Effort normal and breath sounds normal. No respiratory distress. He has no wheezes. He has no rales. He exhibits no tenderness.  Abdominal: Soft. He exhibits no distension and no mass. There is tenderness. There is no rebound and no guarding.  Lower abdominal tenderness bilaterally, no other focal abdominal tenderness  Musculoskeletal: Normal range of motion. He exhibits no edema or tenderness.  Right BKA Left foot remarkable for postoperative changes on the lateral aspect, sutures intact, mild discharge, moderate surrounding erythema and edema  Neurological: He is alert and oriented to person, place, and time.  Skin: Skin is warm and dry.  Psychiatric: He has a normal mood and affect. His behavior is normal. Judgment and thought content normal.  Nursing note and vitals reviewed.    ED Treatments / Results  Labs (all labs ordered are listed, but only abnormal results are displayed) Labs Reviewed  CBC WITH  DIFFERENTIAL/PLATELET - Abnormal; Notable for the following:       Result Value   RBC 3.81 (*)    Hemoglobin 10.5 (*)    HCT 30.8 (*)    All other components within normal limits  COMPREHENSIVE METABOLIC PANEL - Abnormal; Notable for the following:    Chloride 100 (*)    Glucose, Bld 157 (*)    Calcium 8.8 (*)    Total Protein 6.2 (*)    Albumin 2.7 (*)    ALT 12 (*)    All other components within normal limits  URINALYSIS, ROUTINE W REFLEX MICROSCOPIC (NOT AT [facility]) - Abnormal; Notable for the following:    Glucose, UA 100 (*)    Protein, ur 100 (*)    All other components within normal limits  URINE MICROSCOPIC-ADD ON - Abnormal; Notable for the following:    Bacteria, UA FEW (*)    All other components within normal limits  C DIFFICILE QUICK SCREEN W PCR REFLEX    EKG  EKG Interpretation None       Radiology No  results found.  Procedures Procedures (including critical care time)  Medications Ordered in ED Medications  sodium chloride 0.9 % bolus 1,000 mL (not administered)     Initial Impression / Assessment and Plan / ED Course  I have reviewed the triage vital signs and the nursing notes.  Pertinent labs & imaging results that were available during my care of the patient were reviewed by me and considered in my medical decision making (see chart for details).  Clinical Course    Patient seen by and discussed with Dr. Leonette Monarch, who recommends orthopedic consultation. Chest x-ray is negative. No leukocytosis, but the patient does have an elevated temperature 99.9. He does have some easing from the left foot, as well as some swelling around the suture sites. Will speak with orthopedics for further recommendations.  Discussed patient with Dr. Erlinda Hong, who is taking call for [name].  Recommends admission to hospital and will see the patient in the morning.    Appreciate Dr. Ree Kida for observing the patient in the hospital overnight.  Final Clinical  Impressions(s) / ED Diagnoses   Final diagnoses:  Diabetic foot infection ([place])  Acute osteomyelitis of left foot [facility])    New Prescriptions New Prescriptions   No medications on file     Montine Circle, PA-C 05/04/16 1647

## 2016-05-04 NOTE — ED Notes (Signed)
Patient requesting IV in area other than AC, RN attempted x 1 to place IV without success. IV from EMS would not flush or pull back, IV removed and catheter intact. 2nd RN to look for IV placement.

## 2016-05-04 NOTE — ED Provider Notes (Signed)
Medical screening examination/treatment/procedure(s) were conducted as a shared visit with non-physician practitioner(s) and myself.  I personally evaluated the patient during the encounter. Briefly, the patient is a 60 y.o. male who presents with nausea and vomiting for several days with one episode of diarrhea in the setting of antibiotic course completion for osteomyelitis. Exam with mild abdominal discomfort. Left lower extremity with edema and mild erythema surrounding recent surgical incision. Edema is been golfing sutures. Wound is draining serosanguineous fluid and has mild tenderness to palpation. No leukocytosis on labs. Spoke to orthopedic surgery who would like patient admitted.    EKG Interpretation  Date/Time:  Friday May 04 2016 11:55:10 EDT Ventricular Rate:  70 PR Interval:    QRS Duration: 140 QT Interval:  431 QTC Calculation: 466 R Axis:   -54 Text Interpretation:  Sinus rhythm RBBB and LAFB Left ventricular hypertrophy Confirmed by [facility] MD, Veatrice Eckstein ([id number]) on 05/04/2016 12:21:15 PM           Fatima Blank, MD 05/04/16 [id number]

## 2016-05-04 NOTE — ED Notes (Signed)
RN updated on patients status and antibiotics

## 2016-05-04 NOTE — ED Notes (Signed)
No diarrhea or abdominal pain since arrival

## 2016-05-04 NOTE — H&P (Signed)
[facility] History and Physical  [name] FVC:[id number] DOB: 13-Jan-1956 DOA: 05/04/2016  PCP: [name], MD  Patient coming from: Home  Chief Complaint: Fever  HPI: [name] is a 60 y.o. male with a medical history of diastolic heart failure, diabetes, coronary artery disease, who presented to the emergency department with complaints of fever, nausea and vomiting. Patient states his symptoms started approximately 3 days ago. He was recently admitted and discharged on 04/26/2016 for osteomyelitis ulceration of the fifth metatarsal and left cuboid. His antibiotics were recently discontinued on 04/30/2016. He has not noticed any discharge from his foot although he does have wound care nursing at home. He began to have diarrhea approximately one day ago with nausea and vomiting however those things of subsided. He has noticed a low-grade fever at home along with chills.  He also endorses malodorous urine with some pain.  Currently he denies any chest pain, shortness of breath, abdominal pain, further diarrhea or constipation, dizziness, headache, recent travel or ill contacts.  ED Course: Patient started on vancomycin, cefepime, Flagyl. X-ray showed questionable osteomyelitis. Orthopedics, Dr. Erlinda Hong, consulted. [facility] adjusted admit.  Review of Systems:  All other systems reviewed and are negative.   Past Medical History:  Diagnosis Date  . Barrett's esophagus   . Carotid artery occlusion 11/10/10   LEFT CAROTID ENDARTERECTOMY  . CHF (congestive heart failure) ([place])   . Complication of anesthesia    BP WENT UP AT [facility] "  . COPD (chronic obstructive pulmonary disease) ([place])   . Coronary artery disease   . Diabetes mellitus   . Diverticulosis of colon (without mention of hemorrhage)   . DJD (degenerative joint disease)   . Emphysema of lung ([place])   . Fatty liver   . Full dentures   . GERD (gastroesophageal reflux disease)   . H/O Doppler ultrasound 06/28/2012   Lower  venous duplex left-- findings consistent with acute deep vein thrombosis involving the personal vein of the left lower extremity. No evidence of Baker's cyst on the left. Duplex scan of the left lower extremity arteries reveal adequate blood flow.   . H/O hiatal hernia   . History of cardiovascular stress test 03/23/2011   ejection fraction measured 51%, with an end-diastolic volume of 638 ml and an end-systolic volume of 81 ml. no evidence of myocardial ischemia.  Marland Kitchen History of echocardiogram 08/22/2011   Mildly hypertrophic left ventricle with normal systolic function and mild diastolic dysfunction. mildly dilated left atrium. no significant valvular abnormalities. no pericardial effusion no change from previous study 10/2010. the ejection fraction =>55%  . History of Holter monitoring 02/14/2012   Scan quality is fair due to baseline artifact. Predominant rhythm is Sinus. Veentricular Ectopy was noted in single beat form. The total recording time was 24:00 hours. total beats =[id number]. the total time analyzed was 23:58 hours. Heart  Max = 102 at 1:18:22, Min= 52 at 9:56:19 am., Avg= 70. There were 0 pause(s) > 2.5 seconds. the longest pause = 0.00 sec. at 1:51 pm(1)  . Hyperlipidemia   . Hypertension   . Neuromuscular disorder ([place])    peripheral neuropathy  . OSA (obstructive sleep apnea) 12/26/08   AHI of 6.3/hr overall, 32.7/hr during REM sleep and an RDI of 24.8/hr and 50.9/hr.  . Osteomyelitis ([place])   . Osteomyelitis ([place])    left 5th metatarsal  . PAD (peripheral artery disease) ([place])    Distal aortogram June 2012. Atherectomy left popliteal artery July 2012.   Marland Kitchen  Sleep apnea    has not used CPAP in e years - took self off  . Slurred speech    AS PER WIFE IN D/C NOTE 11/10/10  . Substernal chest pain   . Visual color changes    LEFT EYE; BUT NOT TOTAL BLINDNESS  . Wears glasses     Past Surgical History:  Procedure Laterality Date  . AMPUTATION  11/05/2011   Procedure: AMPUTATION RAY;   Surgeon: Wylene Simmer, MD;  Location: [place];  Service: Orthopedics;  Laterality: Right;  Amputation of Right 4&5th Toes  . AMPUTATION Left 11/26/2012   Procedure: AMPUTATION RAY;  Surgeon: Wylene Simmer, MD;  Location: [place];  Service: Orthopedics;  Laterality: Left;  fourth ray amputation  . AMPUTATION Right 08/27/2014   Procedure: Transmetatarsal Amputation;  Surgeon: Newt Minion, MD;  Location: [place];  Service: Orthopedics;  Laterality: Right;  . AMPUTATION Right 01/14/2015   Procedure: AMPUTATION BELOW KNEE;  Surgeon: Newt Minion, MD;  Location: [place];  Service: Orthopedics;  Laterality: Right;  . AMPUTATION Left 10/21/2015   Procedure: Left Foot 5th Ray Amputation;  Surgeon: Newt Minion, MD;  Location: [place];  Service: Orthopedics;  Laterality: Left;  . ANTERIOR FUSION CERVICAL SPINE  02/06/06   C4-5, C5-6, C6-7; SURGEON DR. MAX COHEN  . CARDIAC CATHETERIZATION  10/31/04   2009  . CAROTID ENDARTERECTOMY  11/10/10  . CAROTID ENDARTERECTOMY Left 11/10/2010   Subtotal occlusion of left internal carotid artery with left hemispheric transient ischemic attacks.  . CARPAL TUNNEL RELEASE Right 10/21/2013   Procedure: RIGHT CARPAL TUNNEL RELEASE;  Surgeon: Wynonia Sours, MD;  Location: [place];  Service: Orthopedics;  Laterality: Right;  . CHOLECYSTECTOMY    . COLONOSCOPY    . ESOPHAGEAL MANOMETRY Bilateral 07/19/2014   Procedure: ESOPHAGEAL MANOMETRY (EM);  Surgeon: Jerene Bears, MD;  Location: [facility];  Service: Gastroenterology;  Laterality: Bilateral;  . FEMORAL ARTERY STENT     x6  . FOOT SURGERY  04/25/2016    EXCISION BASE 5TH METATARSAL AND PARTIAL CUBOID LEFT FOOT  . HERNIA REPAIR     LEFT INGUINAL AND UMBILICAL REPAIRS  . I&D EXTREMITY Left 04/25/2016   Procedure: EXCISION BASE 5TH METATARSAL AND PARTIAL CUBOID LEFT FOOT;  Surgeon: Newt Minion, MD;  Location: [place];  Service: Orthopedics;  Laterality: Left;  . JOINT REPLACEMENT Right 2001   Total  . LAMINECTOMY       X 3 LUMBAR AND X 2 CERVICAL SPINE OPERATIONS  . LAPAROSCOPIC CHOLECYSTECTOMY W/ CHOLANGIOGRAPHY  11/09/04   SURGEON DR. Luella Cook  . LEFT HEART CATHETERIZATION WITH CORONARY ANGIOGRAM N/A 10/29/2014   Procedure: LEFT HEART CATHETERIZATION WITH CORONARY ANGIOGRAM;  Surgeon: Laverda Page, MD;  [facility];  Service: Cardiovascular;  Laterality: N/A;  . LOWER EXTREMITY ANGIOGRAM N/A 03/19/2012   Procedure: LOWER EXTREMITY ANGIOGRAM;  Surgeon: Burnell Blanks, MD;  Location: [facility] CATH LAB;  Service: Cardiovascular;  Laterality: N/A;  . NECK SURGERY    . PENILE PROSTHESIS IMPLANT  08/14/05   INFRAPUBIC INSERTION OF INFLATABLE PENILE PROSTHESIS; SURGEON DR. Amalia Hailey  . PERCUTANEOUS CORONARY STENT INTERVENTION (PCI-S) Right 10/29/2014   Procedure: PERCUTANEOUS CORONARY STENT INTERVENTION (PCI-S);  Surgeon: Laverda Page, MD;  Location: [facility] CATH LAB;  Service: Cardiovascular;  Laterality: Right;  . SHOULDER ARTHROSCOPY    . SPINE SURGERY    . TONSILLECTOMY    . TOTAL KNEE ARTHROPLASTY  07/2002   RIGHT KNEE ;  SURGEON  DR. [name] ALSO HAD ARTHROSCOPIC RIGHT KNEE IN  10/2001  . ULNAR NERVE TRANSPOSITION Right 10/21/2013   Procedure: RIGHT ELBOW  ULNAR NERVE DECOMPRESSION;  Surgeon: Wynonia Sours, MD;  Location: [place];  Service: Orthopedics;  Laterality: Right;    Social History:  reports that he quit smoking about 4 years ago. His smoking use included E-cigarettes. He has a 17.50 pack-year smoking history. He has never used smokeless tobacco. He reports that he drinks alcohol. He reports that he does not use drugs.   Allergies  Allergen Reactions  . Ivp Dye [Iodinated Diagnostic Agents] Anaphylaxis and Other (See Comments)    Breathing problems   . Adhesive [Tape] Rash    Rash after 2-3 days use    Family History  Problem Relation Age of Onset  . Heart disease Father     Before age 66-  CAD, BPG  . Diabetes Father     Amputation  . Cancer Father      PROSTATE  . Hyperlipidemia Father   . Hypertension Father   . Heart attack Father     Triple BPG  . Varicose Veins Father   . Colon cancer Brother   . Cancer Brother     Colon  . Diabetes Brother   . Heart disease Brother 54    A-Fib. Before age 16  . Hyperlipidemia Brother   . Hypertension Brother   . Cancer Sister     Breast  . Hyperlipidemia Sister   . Hypertension Sister   . Hypertension Son   . Arthritis      GRANDMOTHER  . Hypertension      OTHER FAMILY MEMBERS    Prior to Admission medications   Medication Sig Start Date End Date Taking? Authorizing Provider  ALPRAZolam (XANAX) 0.25 MG tablet Take 0.25 mg by mouth daily. 08/22/15  Yes Historical Provider, MD  amLODipine-olmesartan (AZOR) 5-20 MG tablet Take 1 tablet by mouth at bedtime.   Yes Historical Provider, MD  aspirin EC 81 MG tablet Take 81 mg by mouth daily.   Yes Historical Provider, MD  atorvastatin (LIPITOR) 40 MG tablet Take 40 mg by mouth daily.   Yes Historical Provider, MD  carvedilol (COREG) 25 MG tablet Take 25 mg by mouth daily. 08/18/15  Yes Historical Provider, MD  carvedilol (COREG) 6.25 MG tablet Take 6.25 mg by mouth at bedtime.   Yes Historical Provider, MD  clobetasol (TEMOVATE) 0.05 % external solution Apply 1 application topically daily as needed (dry scalp).   Yes Historical Provider, MD  clopidogrel (PLAVIX) 75 MG tablet Take 75 mg by mouth daily.  03/28/12  Yes Historical Provider, MD  clotrimazole-betamethasone (LOTRISONE) cream Apply 1 application topically daily as needed (dry skin - beard).    Yes Historical Provider, MD  collagenase (SANTYL) ointment Apply topically daily. Patient taking differently: Apply 1 application topically daily.  09/06/15  Yes Lavina Hamman, MD  ezetimibe (ZETIA) 10 MG tablet Take 10 mg by mouth daily.   Yes Historical Provider, MD  fentaNYL (DURAGESIC - DOSED MCG/HR) 50 MCG/HR Place 1 patch (50 mcg total) onto the skin every 3 (three) days. 01/20/15  Yes Florencia Reasons, MD  fluticasone (CUTIVATE) 0.05 % cream Apply 1 application topically 2 (two) times daily as needed (dry skin on face).    Yes Historical Provider, MD  glyBURIDE-metformin (GLUCOVANCE) 2.5-500 MG per tablet Take 2 tablets by mouth 2 (two) times daily with a meal.  03/05/12  Yes Historical  Provider, MD  HYDROmorphone (DILAUDID) 2 MG tablet Take 2 mg by mouth at bedtime.   Yes Historical Provider, MD  insulin glargine (LANTUS) 100 unit/mL SOPN Inject 40 Units into the skin at bedtime.   Yes Historical Provider, MD  Insulin Glulisine (APIDRA) 100 UNIT/ML Solostar Pen Inject 10 Units into the skin 3 (three) times daily with meals.   Yes Historical Provider, MD  isosorbide mononitrate (IMDUR) 120 MG 24 hr tablet Take 120 mg by mouth daily.   Yes Historical Provider, MD  Linaclotide Rolan Lipa) 145 MCG CAPS capsule Take 1 capsule (145 mcg total) by mouth daily as needed. Patient taking differently: Take 145 mcg by mouth daily as needed (constipation).  01/20/15  Yes Florencia Reasons, MD  meloxicam (MOBIC) 15 MG tablet Take 15 mg by mouth at bedtime.  07/12/15  Yes Historical Provider, MD  Multiple Vitamin (MULTIVITAMIN WITH MINERALS) TABS tablet Take 1 tablet by mouth daily.   Yes Historical Provider, MD  naproxen sodium (ALEVE) 220 MG tablet Take 220 mg by mouth 2 (two) times daily with a meal.   Yes Historical Provider, MD  nitroGLYCERIN (NITRO-BID) 2 % ointment Apply 0.75 inches topically daily as needed (esophogeal spasms).    Yes Historical Provider, MD  nitroGLYCERIN (NITROSTAT) 0.4 MG SL tablet Place 1 tablet (0.4 mg total) under the tongue every 5 (five) minutes as needed (as needed for esophageal spasm). Patient taking differently: Place 0.4 mg under the tongue every 5 (five) minutes as needed for chest pain.  08/09/14  Yes Jerene Bears, MD  omega-3 acid ethyl esters (LOVAZA) 1 G capsule Take 2 g by mouth 2 (two) times daily.   Yes Historical Provider, MD  ondansetron (ZOFRAN-ODT) 4 MG disintegrating tablet  Take 4 mg by mouth 4 (four) times daily as needed for nausea.   Yes Historical Provider, MD  oxyCODONE-acetaminophen (PERCOCET/ROXICET) 5-325 MG per tablet Take 1 tablet by mouth every 8 (eight) hours as needed. Patient taking differently: Take 1 tablet by mouth every 8 (eight) hours. scheduled 09/30/14  Yes Thurnell Lose, MD  pantoprazole (PROTONIX) 40 MG tablet Take 1 tablet (40 mg total) by mouth daily before breakfast. 10/30/14  Yes Adrian Prows, MD  Polyethyl Glycol-Propyl Glycol (SYSTANE ULTRA OP) Place 1 drop into both eyes as needed (for dry eyes).   Yes Historical Provider, MD  pregabalin (LYRICA) 100 MG capsule Take 100 mg by mouth 3 (three) times daily.   Yes Historical Provider, MD  rOPINIRole (REQUIP) 2 MG tablet Take 2 mg by mouth at bedtime.    Yes Historical Provider, MD  Tamsulosin HCl (FLOMAX) 0.4 MG CAPS Take 0.4 mg by mouth daily.   Yes Historical Provider, MD  torsemide (DEMADEX) 20 MG tablet Take 40 mg by mouth daily.    Yes Historical Provider, MD    Physical Exam: Vitals:   05/04/16 1157 05/04/16 1526  BP: 137/69 163/80  Pulse: 69 70  Resp: 21 18  Temp: 99.9 F (37.7 C) 99.7 F (37.6 C)     General: Well developed, well nourished, NAD, appears stated age  HEENT: NCAT, PERRLA, EOMI, Anicteic Sclera, mucous membranes moist.   Neck: Supple, no JVD, no masses  Cardiovascular: S1 S2 auscultated, no rubs, murmurs or gallops. Regular rate and rhythm.  Respiratory: Clear to auscultation bilaterally with equal chest rise  Abdomen: Soft, nontender, nondistended, + bowel sounds  Extremities: Right BKA, left foot- postoperative changes, 4th and 5th digit amputations, sutures in place, edema with erythema.   Neuro:  AAOx3, nonfocal  Skin: Without rashes exudates or nodules  Psych: Normal affect and demeanor with intact judgement and insight  Labs on Admission: I have personally reviewed following labs and imaging studies CBC:  Recent Labs Lab 05/04/16 1250    WBC 9.6  NEUTROABS 7.6  HGB 10.5*  HCT 30.8*  MCV 80.8  PLT 741   Basic Metabolic Panel:  Recent Labs Lab 05/04/16 1250  NA 135  K 3.9  CL 100*  CO2 27  GLUCOSE 157*  BUN 11  CREATININE 1.20  CALCIUM 8.8*   GFR: Estimated Creatinine Clearance: 86.6 mL/min (by C-G formula based on SCr of 1.2 mg/dL). Liver Function Tests:  Recent Labs Lab 05/04/16 1250  AST 16  ALT 12*  ALKPHOS 107  BILITOT 0.6  PROT 6.2*  ALBUMIN 2.7*   No results for input(s): LIPASE, AMYLASE in the last 168 hours. No results for input(s): AMMONIA in the last 168 hours. Coagulation Profile: No results for input(s): INR, PROTIME in the last 168 hours. Cardiac Enzymes: No results for input(s): CKTOTAL, CKMB, CKMBINDEX, TROPONINI in the last 168 hours. BNP (last 3 results) No results for input(s): PROBNP in the last 8760 hours. HbA1C: No results for input(s): HGBA1C in the last 72 hours. CBG: No results for input(s): GLUCAP in the last 168 hours. Lipid Profile: No results for input(s): CHOL, HDL, LDLCALC, TRIG, CHOLHDL, LDLDIRECT in the last 72 hours. Thyroid Function Tests: No results for input(s): TSH, T4TOTAL, FREET4, T3FREE, THYROIDAB in the last 72 hours. Anemia Panel: No results for input(s): VITAMINB12, FOLATE, FERRITIN, TIBC, IRON, RETICCTPCT in the last 72 hours. Urine analysis:    Component Value Date/Time   COLORURINE YELLOW 05/04/2016 [place] 05/04/2016 1610   LABSPEC 1.014 05/04/2016 1610   PHURINE 5.5 05/04/2016 1610   GLUCOSEU 100 (A) 05/04/2016 1610   HGBUR NEGATIVE 05/04/2016 1610   BILIRUBINUR NEGATIVE 05/04/2016 1610   KETONESUR NEGATIVE 05/04/2016 1610   PROTEINUR 100 (A) 05/04/2016 1610   UROBILINOGEN 0.2 01/14/2015 0346   NITRITE NEGATIVE 05/04/2016 1610   LEUKOCYTESUR NEGATIVE 05/04/2016 1610   Sepsis Labs: @LABRCNTIP (procalcitonin:4,lacticidven:4) )No results found for this or any previous visit (from the past 240 hour(s)).    Radiological Exams on Admission: Dg Chest 2 View  Result Date: 05/04/2016 CLINICAL DATA:  Chest pain EXAM: CHEST  2 VIEW COMPARISON:  12/16/2015 FINDINGS: The heart size appears normal. There is no pleural effusion or edema. No airspace consolidation identified. The lung volumes appear low. There is mild spondylosis within the thoracic spine. IMPRESSION: 1. No acute cardiopulmonary abnormalities. Electronically Signed   By: Kerby Moors M.D.   On: 05/04/2016 13:36   Dg Foot Complete Left  Result Date: 05/04/2016 CLINICAL DATA:  Diabetic with soft tissue wound EXAM: LEFT FOOT - COMPLETE 3+ VIEW COMPARISON:  December 16, 2015 FINDINGS: Frontal, oblique, and lateral views were obtained. There has been interval removal of the remainder of the fifth metatarsal. Currently there is been amputation of the right fifth metatarsal and fifth phalanges as well as the fourth proximal, middle, distal phalanges. On the current examination, there is soft tissue prominence laterally without radiopaque foreign body. There is subtle loss of bony definition along the lateral aspect of the cuboid bone as well as along the lateral proximal aspect of the fourth metatarsal. These findings are concerning for potential osteomyelitis. There is no acute fracture or dislocation. There is moderate narrowing of all remaining PIP and DIP joints. No erosive change. There is spurring  in the dorsal midfoot. There is a spur arising from the inferior calcaneus. There is atherosclerotic calcification anterior to the ankle joint. Note that there is generalized soft tissue edema, particularly in the dorsum of the foot. IMPRESSION: Prior amputations as noted above. Loss of bony definition along the lateral aspect of the proximal fourth metatarsal as well as along the lateral cuboid. These findings raise concern for osteomyelitis in these areas. There is soft tissue prominence lateral to these areas without well-defined soft tissue abscess. There is  generalized soft tissue edema most notably dorsally. No acute fracture or dislocation. Joint space narrowing at several sites. MR or three-phase nuclear medicine bone scan could be helpful for further assessment with respect to potential osteomyelitis. Electronically Signed   By: Lowella Grip III M.D.   On: 05/04/2016 14:44    EKG: Independently reviewed. Sinus, rate 70, RBBB, LAFB  Assessment/Plan Left Diabetic foot infection/Osteomyelitis -Patient presented to the emergency department with low-grade fever of 99.70F. -Currently no leukocytosis noted. -Left foot x-ray shows prior amputations, loss of bone along the proximal fourth metatarsal as well as a lateral cuboid, concern for osteomyelitis. No abscess. No acute fracture or dislocation. -Patient recently underwent excision of the base of the fifth metatarsal as well as partial excision of the cuboid. He was recently discharged on 04/26/2016. -He was on antibiotics as an outpatient for these were discontinued on 04/30/2016 -Orthopedics consulted and appreciated -Diabetic foot orders set utilized -Will place patient on vancomycin, cefepime, Flagyl -Pending blood cultures, ESR, CRP  Chronic diastolic CHF -Echocardiogram 09/27/2014 showed an EF of 33-00%, grade 2 diastolic dysfunction -Monitor intake and output, daily weights -Continue torsemide, Coreg, olmesartan  Essential hypertension -Stable, continue Azor, Coreg, Imdur  Coronary artery disease -Stable, currently no complaints of chest pain. -Continue Coreg, aspirin, Plavix, Imdur, Lipitor, Axor, zetia, statin  Diabetes mellitus, type II with peripheral neuropathy -Hold Glucovance -Continue Lantus, Apidra -Will place on insulin sliding scale CBG monitoring -Last hemoglobin A1c on 04/25/2016 was 9.2, prior to that 6.8 -Continue Lyrica  Chronic pain -Continue home regimen: Fentanyl patch, naproxen, mobile, Dilaudid, oxycodone  Diarrhea -Patient endorses a few episodes of  diarrhea on 05/03/2016, prior to admission -C. difficile PCR pending  Dysuria -UA unremarkable for infection, however patient was previously on antibiotics until 04/30/2016 -Patient will be placed on antibiotics  GERD -Will hold PPI until Cdiff PCR has returned  DVT prophylaxis: Lovenox  Code Status: Full  Family Communication: None at bedside. Admission, patients condition and plan of care including tests being ordered have been discussed with the patient, who indicates understanding and agrees with the plan and Code Status.  Disposition Plan: Admitted for observation to med floor  Consults called: Orthopedics, by EDP   Admission status: observation   Time spent: 70 minutes  Dossie Ocanas D.O. [facility] Pager [phone]  If 7PM-7AM, please contact night-coverage [email or web address] Password [facility] 05/04/2016, 5:05 PM

## 2016-05-05 ENCOUNTER — Observation Stay (HOSPITAL_COMMUNITY): Payer: BLUE CROSS/BLUE SHIELD

## 2016-05-05 DIAGNOSIS — J440 Chronic obstructive pulmonary disease with acute lower respiratory infection: Secondary | ICD-10-CM | POA: Diagnosis present

## 2016-05-05 DIAGNOSIS — Z89422 Acquired absence of other left toe(s): Secondary | ICD-10-CM | POA: Diagnosis not present

## 2016-05-05 DIAGNOSIS — E1142 Type 2 diabetes mellitus with diabetic polyneuropathy: Secondary | ICD-10-CM | POA: Diagnosis present

## 2016-05-05 DIAGNOSIS — Z89511 Acquired absence of right leg below knee: Secondary | ICD-10-CM | POA: Diagnosis not present

## 2016-05-05 DIAGNOSIS — I130 Hypertensive heart and chronic kidney disease with heart failure and stage 1 through stage 4 chronic kidney disease, or unspecified chronic kidney disease: Secondary | ICD-10-CM | POA: Diagnosis present

## 2016-05-05 DIAGNOSIS — Z794 Long term (current) use of insulin: Secondary | ICD-10-CM | POA: Diagnosis not present

## 2016-05-05 DIAGNOSIS — Z981 Arthrodesis status: Secondary | ICD-10-CM | POA: Diagnosis not present

## 2016-05-05 DIAGNOSIS — Z955 Presence of coronary angioplasty implant and graft: Secondary | ICD-10-CM | POA: Diagnosis not present

## 2016-05-05 DIAGNOSIS — E118 Type 2 diabetes mellitus with unspecified complications: Secondary | ICD-10-CM | POA: Diagnosis not present

## 2016-05-05 DIAGNOSIS — E785 Hyperlipidemia, unspecified: Secondary | ICD-10-CM | POA: Diagnosis present

## 2016-05-05 DIAGNOSIS — G4733 Obstructive sleep apnea (adult) (pediatric): Secondary | ICD-10-CM | POA: Diagnosis present

## 2016-05-05 DIAGNOSIS — Z79899 Other long term (current) drug therapy: Secondary | ICD-10-CM | POA: Diagnosis not present

## 2016-05-05 DIAGNOSIS — K219 Gastro-esophageal reflux disease without esophagitis: Secondary | ICD-10-CM | POA: Diagnosis present

## 2016-05-05 DIAGNOSIS — J449 Chronic obstructive pulmonary disease, unspecified: Secondary | ICD-10-CM | POA: Diagnosis present

## 2016-05-05 DIAGNOSIS — G8929 Other chronic pain: Secondary | ICD-10-CM | POA: Diagnosis present

## 2016-05-05 DIAGNOSIS — E1169 Type 2 diabetes mellitus with other specified complication: Secondary | ICD-10-CM | POA: Diagnosis present

## 2016-05-05 DIAGNOSIS — M86172 Other acute osteomyelitis, left ankle and foot: Secondary | ICD-10-CM | POA: Diagnosis present

## 2016-05-05 DIAGNOSIS — R509 Fever, unspecified: Secondary | ICD-10-CM | POA: Diagnosis present

## 2016-05-05 DIAGNOSIS — D6489 Other specified anemias: Secondary | ICD-10-CM | POA: Diagnosis present

## 2016-05-05 DIAGNOSIS — Z8249 Family history of ischemic heart disease and other diseases of the circulatory system: Secondary | ICD-10-CM | POA: Diagnosis not present

## 2016-05-05 DIAGNOSIS — L089 Local infection of the skin and subcutaneous tissue, unspecified: Secondary | ICD-10-CM | POA: Diagnosis not present

## 2016-05-05 DIAGNOSIS — E1122 Type 2 diabetes mellitus with diabetic chronic kidney disease: Secondary | ICD-10-CM | POA: Diagnosis present

## 2016-05-05 DIAGNOSIS — I5032 Chronic diastolic (congestive) heart failure: Secondary | ICD-10-CM | POA: Diagnosis present

## 2016-05-05 DIAGNOSIS — I10 Essential (primary) hypertension: Secondary | ICD-10-CM | POA: Diagnosis not present

## 2016-05-05 DIAGNOSIS — Z7982 Long term (current) use of aspirin: Secondary | ICD-10-CM | POA: Diagnosis not present

## 2016-05-05 DIAGNOSIS — N182 Chronic kidney disease, stage 2 (mild): Secondary | ICD-10-CM | POA: Diagnosis present

## 2016-05-05 DIAGNOSIS — R300 Dysuria: Secondary | ICD-10-CM | POA: Diagnosis present

## 2016-05-05 DIAGNOSIS — I2510 Atherosclerotic heart disease of native coronary artery without angina pectoris: Secondary | ICD-10-CM | POA: Diagnosis present

## 2016-05-05 LAB — HIV ANTIBODY (ROUTINE TESTING W REFLEX): HIV Screen 4th Generation wRfx: NONREACTIVE

## 2016-05-05 LAB — CBC
HCT: 29.1 % — ABNORMAL LOW (ref 39.0–52.0)
Hemoglobin: 9.4 g/dL — ABNORMAL LOW (ref 13.0–17.0)
MCH: 26.9 pg (ref 26.0–34.0)
MCHC: 32.3 g/dL (ref 30.0–36.0)
MCV: 83.4 fL (ref 78.0–100.0)
Platelets: 274 10*3/uL (ref 150–400)
RBC: 3.49 MIL/uL — ABNORMAL LOW (ref 4.22–5.81)
RDW: 12.7 % (ref 11.5–15.5)
WBC: 7.8 10*3/uL (ref 4.0–10.5)

## 2016-05-05 LAB — BASIC METABOLIC PANEL
Anion gap: 11 (ref 5–15)
BUN: 18 mg/dL (ref 6–20)
CO2: 24 mmol/L (ref 22–32)
Calcium: 8.4 mg/dL — ABNORMAL LOW (ref 8.9–10.3)
Chloride: 100 mmol/L — ABNORMAL LOW (ref 101–111)
Creatinine, Ser: 1.43 mg/dL — ABNORMAL HIGH (ref 0.61–1.24)
GFR calc Af Amer: 60 mL/min — ABNORMAL LOW (ref >60)
GFR calc non Af Amer: 51 mL/min — ABNORMAL LOW (ref >60)
Glucose, Bld: 270 mg/dL — ABNORMAL HIGH (ref 65–99)
Potassium: 4 mmol/L (ref 3.5–5.1)
Sodium: 135 mmol/L (ref 135–145)

## 2016-05-05 LAB — GLUCOSE, CAPILLARY
Glucose-Capillary: 260 mg/dL — ABNORMAL HIGH (ref 65–99)
Glucose-Capillary: 303 mg/dL — ABNORMAL HIGH (ref 65–99)
Glucose-Capillary: 240 mg/dL — ABNORMAL HIGH (ref 65–99)
Glucose-Capillary: 325 mg/dL — ABNORMAL HIGH (ref 65–99)

## 2016-05-05 MED ORDER — FENTANYL 50 MCG/HR TD PT72
50.0000 ug | MEDICATED_PATCH | TRANSDERMAL | Status: DC
  Administered 2016-05-05 – 2016-05-11 (×3): 50 ug via TRANSDERMAL
  Filled 2016-05-05: qty 1
  Filled 2016-05-05: qty 4
  Filled 2016-05-05 (×2): qty 1

## 2016-05-05 MED ORDER — PANTOPRAZOLE SODIUM 40 MG PO TBEC
40.0000 mg | DELAYED_RELEASE_TABLET | Freq: Every day | ORAL | Status: DC
  Administered 2016-05-05 – 2016-05-11 (×7): 40 mg via ORAL
  Filled 2016-05-05 (×7): qty 1

## 2016-05-05 MED ORDER — GADOBENATE DIMEGLUMINE 529 MG/ML IV SOLN
20.0000 mL | Freq: Once | INTRAVENOUS | Status: AC | PRN
  Administered 2016-05-05: 20 mL via INTRAVENOUS

## 2016-05-05 NOTE — Consult Note (Signed)
[place] Nurse wound consult note Reason for Consult: Left lateral foot with surgical incision draining, red, warm.  Orthopedics simultaneously consulted, Dr. Erlinda Hong saw today and ordered MRI.  No wound care orders provided. Wound type: Surgical Pressure Ulcer POA: No Measurement:10cm incision with 8 retention sutures intact.  Wound [id number] that are not closely approximated are red, moist Drainage (amount, consistency, odor) Moderate amount of serosanguinous exudate, no odor. Periwound: Erythema, warm, and mildly indurated Dressing procedure/placement/frequency: Patient is able to report how [name] dressed wound when he saw him in the office earlier this week, I agree with that topical plan until the course of care is further determined and have provided Nursing with that guidance via the Orders. MRI was performed about an hour ago.   [place] nursing team will not follow, but will remain available to this patient, the nursing and medical teams.  Please re-consult if needed. Thanks, Maudie Flakes, MSN, RN, [place], Arther Abbott  Pager# [phone]

## 2016-05-05 NOTE — Progress Notes (Addendum)
Initial Nutrition Assessment  DOCUMENTATION CODES:  Obesity unspecified  INTERVENTION:  Glucerna Shake po BID, each supplement provides 220 kcal and 10 grams of protein  Currently, pt is eating well w/ supplements, receiving adequate hydration, and has been on an mvi. Nutritionally, he is doing well, with the exception of his suboptimal glucose control.   NUTRITION DIAGNOSIS:  Increased nutrient needs related to wound healing as evidenced by estimated needs.  GOAL:  Patient will meet greater than or equal to 90% of their needs  MONITOR:  Supplement acceptance, PO intake, Labs  REASON FOR ASSESSMENT:  Consult Wound healing  ASSESSMENT:  60 y/o male PMHx HF, DM, CAD, R BKA, PAD, Emphysema, GERD, DJD, COPD who presented with fever, nausea, vomiting, diarrhea. Worked up for L foot infection/osteomyelitis.   Pt reports that he has eaten well since he was placed on the diabetics. He did not eat well the few days PTA. At home, he took a mvi. He says he followed a DM diet but shortly after stated he mostly drinks Mt. Dew. He drinks 2-3 glucerna supplements each day as well.   Pt's A1C increased significantly in the last few months where as it has been <7.    He denies any weight changes, though he does appear to have lost 14 lbs in the last 1.5 months (his 8/16 weight was pulled from previous encounter). His weight waxes and wanes, not reliable nutrition indicator.   RD went over importance of protein, vitamins, fluid with wound healing. He was taking a daily mvi PTA. He says he is planning on switching from [place] dew to water. Emphasized that this was a very good idea. His PO intake has improved:    Though there is 0% documented for breakfast, he states he ate actually ate 100%. Potentially this was brought from outside to him. He ate 100% of lunch. He states his appetite has returned.   Continue Glucerna BID. He actually brought some from home.   Medications: Dilaudid, insulin, ppi, IV  Vanc, Demadex, mvi with min  Labs reviewed: BG 250-300. a1c 10 days ago was 9.2, 4 months ago was 6.9  Recent Labs Lab 05/04/16 1250 05/04/16 1934 05/05/16 0632  NA 135 133* 135  K 3.9 3.6 4.0  CL 100* 101 100*  CO2 27 27 24   BUN 11 9 18   CREATININE 1.20 1.04 1.43*  CALCIUM 8.8* 8.7* 8.4*  GLUCOSE 157* 101* 270*   Diet Order:  Diet heart healthy/carb modified Room service appropriate? Yes; Fluid consistency: Thin  Skin: Left foot wound  Last BM:  8/25  Height:  Ht Readings from Last 1 Encounters:  04/25/16 6\' 2"  (1.88 m)   Weight:  Wt Readings from Last 1 Encounters:  05/04/16 236 lb 15.9 oz (107.5 kg)   Wt Readings from Last 10 Encounters:  05/04/16 236 lb 15.9 oz (107.5 kg)  04/25/16 250 lb (113.4 kg)  03/23/16 250 lb (113.4 kg)  12/16/15 230 lb 1 oz (104.4 kg)  10/21/15 250 lb (113.4 kg)  09/18/15 250 lb (113.4 kg)  09/06/15 253 lb 1.6 oz (114.8 kg)  03/23/15 252 lb (114.3 kg)  02/25/15 250 lb (113.4 kg)  02/15/15 257 lb (116.6 kg)   Ideal Body Weight:  80.75 kg (adjusted for BKA)  Adjusted BMI:  Body mass index is 32.5 kg/m.  Estimated Nutritional Needs:  Kcal:  2050-2250 (19-21 kcal/kg bw) Protein:  97-113 g (1.2-1.4 g/kg ibw) Fluid:  Per MD  EDUCATION NEEDS:  Education needs no  appropriate at this time  Burtis Junes RD, LDN, [place] Nutrition Pager: [phone] 05/05/2016 5:17 PM

## 2016-05-05 NOTE — Progress Notes (Signed)
Left foot with recent surgery.  Wound is able to drain through the surgical site.  There is concern for continued infection.  He is currently not septic.  Agree with broad spectrum empiric abx for now.  MRI ordered to evaluate for osteo.  NWB.  Elevate at all times.    Azucena Cecil, MD [place] 8:45 AM

## 2016-05-05 NOTE — Progress Notes (Signed)
Inpatient Diabetes Program Recommendations  AACE/ADA: New Consensus Statement on Inpatient Glycemic Control (2015)  Target Ranges:  Prepandial:   less than 140 mg/dL      Peak postprandial:   less than 180 mg/dL (1-2 hours)      Critically ill patients:  140 - 180 mg/dL   Lab Results  Component Value Date   GLUCAP 303 (H) 05/05/2016   HGBA1C 9.2 (H) 04/25/2016    Review of Glycemic Control  Results for MORSE, FRADETTE (MRN [id number]) as of 05/05/2016 16:25  Ref. Range 05/04/2016 21:48 05/05/2016 07:36 05/05/2016 12:16  Glucose-Capillary Latest Ref Range: 65 - 99 mg/dL 165 (H) 260 (H) 303 (H)   Diabetes coordinator consult noted  Diabetes history: Type 2 Outpatient Diabetes medications: Glucovance 2.5-500mg  2 bid, Lantus 40units qhs, Apidra 10 untis tid  Current orders for Inpatient glycemic control: Lantus 40 units qhs, Novolog 0-9 units tid, Novolog 0-5 units qhs   Inpatient Diabetes Program Recommendations:  Please consider adding Novolog 5 units tid with meals.  Continue Novolog correction but consider increasing it to the moderate scale 0-15 units tid.   Gentry Fitz, RN, BA, MHA, CDE Diabetes Coordinator Inpatient Diabetes Program  [phone] (Team Pager) [phone] ([place]) 05/05/2016 4:47 PM

## 2016-05-05 NOTE — Progress Notes (Signed)
PROGRESS NOTE    [name]  [id number] DOB: 15-Sep-1955 DOA: 05/04/2016 PCP: [name], MD   Chief Complaint  Patient presents with  . Nausea  . Emesis    Brief Narrative:  HPI on 05/04/2016  [name] is a 60 y.o. male with a medical history of diastolic heart failure, diabetes, coronary artery disease, who presented to the emergency department with complaints of fever, nausea and vomiting. Patient states his symptoms started approximately 3 days ago. He was recently admitted and discharged on 04/26/2016 for osteomyelitis ulceration of the fifth metatarsal and left cuboid. His antibiotics were recently discontinued on 04/30/2016. He has not noticed any discharge from his foot although he does have wound care nursing at home. He began to have diarrhea approximately one day ago with nausea and vomiting however those things of subsided. He has noticed a low-grade fever at home along with chills.  He also endorses malodorous urine with some pain.  Currently he denies any chest pain, shortness of breath, abdominal pain, further diarrhea or constipation, dizziness, headache, recent travel or ill contacts. Assessment & Plan   Left Diabetic foot infection/Osteomyelitis -Patient presented to the emergency department with low-grade fever of 99.56F. -Currently no leukocytosis noted. -Left foot x-ray shows prior amputations, loss of bone along the proximal fourth metatarsal as well as a lateral cuboid, concern for osteomyelitis. No abscess. No acute fracture or dislocation. -Patient recently underwent excision of the base of the fifth metatarsal as well as partial excision of the cuboid. He was recently discharged on 04/26/2016. -He was on antibiotics as an outpatient for these were discontinued on 04/30/2016 -Orthopedics consulted and appreciated, plan for MRI of the foot -Diabetic foot orders set utilized -Continue vancomycin, cefepime, Flagyl -Pending blood cultures -ESR 98, CRP 8  (elevated)  Chronic diastolic CHF -Echocardiogram 09/27/2014 showed an EF of 16-01%, grade 2 diastolic dysfunction -Monitor intake and output, daily weights -Continue torsemide, Coreg, olmesartan  Essential hypertension -Stable, continue Azor, Coreg, Imdur  Coronary artery disease -Stable, currently no complaints of chest pain. -Continue Coreg, aspirin, Plavix, Imdur, Lipitor, Axor, zetia, statin  Diabetes mellitus, type II with peripheral neuropathy -Hold Glucovance -Continue Lantus, Apidra -Will place on insulin sliding scale CBG monitoring -Last hemoglobin A1c on 04/25/2016 was 9.2, prior to that 6.8 -Continue Lyrica  Chronic pain -Continue home regimen: Fentanyl patch, naproxen, mobile, Dilaudid, oxycodone  Diarrhea -Patient endorses a few episodes of diarrhea on 05/03/2016, prior to admission. Feels this is due to drinking cold apple juice.   -Will discontinue enteric precautions as patient has not had a bowel movement in 2 days.   Dysuria -UA unremarkable for infection, however patient was previously on antibiotics until 04/30/2016 -Patient placed on antibiotics for ppssible foot osteo  GERD -Will restart PPI  DVT Prophylaxis  lovenox  Code Status: Full  Family Communication: None at bedside  Disposition Plan: Admitted for observation. Pending MRI  Consultants [place]  Procedures  None  Antibiotics   Anti-infectives    Start     Dose/Rate Route Frequency Ordered Stop   05/05/16 0600  vancomycin (VANCOCIN) IVPB 750 mg/150 ml premix     750 mg 150 mL/hr over 60 Minutes Intravenous Every 12 hours 05/04/16 1628     05/04/16 2200  metroNIDAZOLE (FLAGYL) tablet 500 mg     500 mg Oral Every 8 hours 05/04/16 1904     05/04/16 1700  vancomycin (VANCOCIN) 2,000 mg in sodium chloride 0.9 % 500 mL IVPB  2,000 mg 250 mL/hr over 120 Minutes Intravenous  Once 05/04/16 1628 05/04/16 1830   05/04/16 1700  ceFEPIme (MAXIPIME) 2 g in dextrose 5 %  50 mL IVPB     2 g 100 mL/hr over 30 Minutes Intravenous Every 12 hours 05/04/16 1628     05/04/16 1545  metroNIDAZOLE (FLAGYL) IVPB 500 mg  Status:  Discontinued     500 mg 100 mL/hr over 60 Minutes Intravenous Every 8 hours 05/04/16 1537 05/04/16 1908      Subjective:   [name] seen and examined today.  Denies further fever overnight.  Denies chest pain, shortness of breath, abdominal pain, nausea, vomiting. Has not had diarrhea for 2 days.      Objective:   Vitals:   05/04/16 1854 05/04/16 2052 05/05/16 0538 05/05/16 0848  BP: (!) 161/69 (!) 157/57 (!) 154/62 (!) 148/69  Pulse: 64 66 69 72  Resp: 18 18 17 18   Temp: 99 F (37.2 C) 98.6 F (37 C) 99.3 F (37.4 C) 98.4 F (36.9 C)  TempSrc: Oral Oral Oral Oral  SpO2: 100% 100% 98% 98%  Weight:  107.5 kg (236 lb 15.9 oz)      Intake/Output Summary (Last 24 hours) at 05/05/16 1216 Last data filed at 05/05/16 0851  Gross per 24 hour  Intake             2200 ml  Output              375 ml  Net             1825 ml   Filed Weights   05/04/16 2052  Weight: 107.5 kg (236 lb 15.9 oz)    Exam  General: Well developed, well nourished, NAD, appears stated age  HEENT: NCAT, mucous membranes moist.   Cardiovascular: S1 S2 auscultated, no rubs, murmurs or gallops. Regular rate and rhythm.  Respiratory: Clear to auscultation bilaterally with equal chest rise  Abdomen: Soft, obese, nontender, nondistended, + bowel sounds  Extremities: Right BKA, left foot- postoperative changes, 4th and 5th digit amputations, sutures in place, edema with erythema.   Neuro: AAOx3, nonfocal  Psych: Normal affect and demeanor with intact judgement and insight   Data Reviewed: I have personally reviewed following labs and imaging studies  CBC:  Recent Labs Lab 05/04/16 1250 05/04/16 1934 05/05/16 0632  WBC 9.6 8.9 7.8  NEUTROABS 7.6 6.8  --   HGB 10.5* 10.3* 9.4*  HCT 30.8* 30.7* 29.1*  MCV 80.8 81.4 83.4  PLT 310 320 725    Basic Metabolic Panel:  Recent Labs Lab 05/04/16 1250 05/04/16 1934 05/05/16 0632  NA 135 133* 135  K 3.9 3.6 4.0  CL 100* 101 100*  CO2 27 27 24   GLUCOSE 157* 101* 270*  BUN 11 9 18   CREATININE 1.20 1.04 1.43*  CALCIUM 8.8* 8.7* 8.4*   GFR: Estimated Creatinine Clearance: 70.8 mL/min (by C-G formula based on SCr of 1.43 mg/dL). Liver Function Tests:  Recent Labs Lab 05/04/16 1250  AST 16  ALT 12*  ALKPHOS 107  BILITOT 0.6  PROT 6.2*  ALBUMIN 2.7*   No results for input(s): LIPASE, AMYLASE in the last 168 hours. No results for input(s): AMMONIA in the last 168 hours. Coagulation Profile: No results for input(s): INR, PROTIME in the last 168 hours. Cardiac Enzymes: No results for input(s): CKTOTAL, CKMB, CKMBINDEX, TROPONINI in the last 168 hours. BNP (last 3 results) No results for input(s): PROBNP in the last 8760 hours.  HbA1C: No results for input(s): HGBA1C in the last 72 hours. CBG:  Recent Labs Lab 05/04/16 2148 05/05/16 0736  GLUCAP 165* 260*   Lipid Profile: No results for input(s): CHOL, HDL, LDLCALC, TRIG, CHOLHDL, LDLDIRECT in the last 72 hours. Thyroid Function Tests: No results for input(s): TSH, T4TOTAL, FREET4, T3FREE, THYROIDAB in the last 72 hours. Anemia Panel: No results for input(s): VITAMINB12, FOLATE, FERRITIN, TIBC, IRON, RETICCTPCT in the last 72 hours. Urine analysis:    Component Value Date/Time   COLORURINE YELLOW 05/04/2016 [place] 05/04/2016 1610   LABSPEC 1.014 05/04/2016 1610   PHURINE 5.5 05/04/2016 1610   GLUCOSEU 100 (A) 05/04/2016 1610   HGBUR NEGATIVE 05/04/2016 1610   BILIRUBINUR NEGATIVE 05/04/2016 1610   KETONESUR NEGATIVE 05/04/2016 1610   PROTEINUR 100 (A) 05/04/2016 1610   UROBILINOGEN 0.2 01/14/2015 0346   NITRITE NEGATIVE 05/04/2016 1610   LEUKOCYTESUR NEGATIVE 05/04/2016 1610   Sepsis Labs: @LABRCNTIP (procalcitonin:4,lacticidven:4)  )No results found for this or any previous  visit (from the past 240 hour(s)).    Radiology Studies: Dg Chest 2 View  Result Date: 05/04/2016 CLINICAL DATA:  Chest pain EXAM: CHEST  2 VIEW COMPARISON:  12/16/2015 FINDINGS: The heart size appears normal. There is no pleural effusion or edema. No airspace consolidation identified. The lung volumes appear low. There is mild spondylosis within the thoracic spine. IMPRESSION: 1. No acute cardiopulmonary abnormalities. Electronically Signed   By: Kerby Moors M.D.   On: 05/04/2016 13:36   Dg Foot Complete Left  Result Date: 05/04/2016 CLINICAL DATA:  Diabetic with soft tissue wound EXAM: LEFT FOOT - COMPLETE 3+ VIEW COMPARISON:  December 16, 2015 FINDINGS: Frontal, oblique, and lateral views were obtained. There has been interval removal of the remainder of the fifth metatarsal. Currently there is been amputation of the right fifth metatarsal and fifth phalanges as well as the fourth proximal, middle, distal phalanges. On the current examination, there is soft tissue prominence laterally without radiopaque foreign body. There is subtle loss of bony definition along the lateral aspect of the cuboid bone as well as along the lateral proximal aspect of the fourth metatarsal. These findings are concerning for potential osteomyelitis. There is no acute fracture or dislocation. There is moderate narrowing of all remaining PIP and DIP joints. No erosive change. There is spurring in the dorsal midfoot. There is a spur arising from the inferior calcaneus. There is atherosclerotic calcification anterior to the ankle joint. Note that there is generalized soft tissue edema, particularly in the dorsum of the foot. IMPRESSION: Prior amputations as noted above. Loss of bony definition along the lateral aspect of the proximal fourth metatarsal as well as along the lateral cuboid. These findings raise concern for osteomyelitis in these areas. There is soft tissue prominence lateral to these areas without well-defined soft  tissue abscess. There is generalized soft tissue edema most notably dorsally. No acute fracture or dislocation. Joint space narrowing at several sites. MR or three-phase nuclear medicine bone scan could be helpful for further assessment with respect to potential osteomyelitis. Electronically Signed   By: Lowella Grip III M.D.   On: 05/04/2016 14:44     Scheduled Meds: . ALPRAZolam  0.25 mg Oral Daily  . amLODipine  5 mg Oral QHS   And  . irbesartan  150 mg Oral QHS  . aspirin EC  81 mg Oral Daily  . atorvastatin  40 mg Oral Daily  . carvedilol  25 mg Oral Daily  . carvedilol  6.25 mg Oral QHS  . ceFEPime (MAXIPIME) IV  2 g Intravenous Q12H  . clopidogrel  75 mg Oral Daily  . collagenase   Topical Daily  . enoxaparin (LOVENOX) injection  40 mg Subcutaneous Q24H  . ezetimibe  10 mg Oral Daily  . [START ON 05/06/2016] fentaNYL  50 mcg Transdermal Q72H  . HYDROmorphone  2 mg Oral QHS  . insulin aspart  0-5 Units Subcutaneous QHS  . insulin aspart  0-9 Units Subcutaneous TID WC  . insulin glargine  40 Units Subcutaneous QHS  . isosorbide mononitrate  120 mg Oral Daily  . meloxicam  15 mg Oral QHS  . metroNIDAZOLE  500 mg Oral Q8H  . multivitamin with minerals  1 tablet Oral Daily  . naproxen sodium  275 mg Oral BID WC  . omega-3 acid ethyl esters  2 g Oral BID  . pregabalin  100 mg Oral TID  . rOPINIRole  2 mg Oral QHS  . sodium chloride flush  3 mL Intravenous Q12H  . tamsulosin  0.4 mg Oral Daily  . torsemide  40 mg Oral Daily  . vancomycin  750 mg Intravenous Q12H   Continuous Infusions:    LOS: 0 days   Time Spent in minutes   30 minutes  [name] D.O. on 05/05/2016 at 12:16 PM  Between 7am to 7pm - Pager - [phone]  After 7pm go to [email or web address] - password [email or web address]  And look for the night coverage person covering for me after hours  [facility] Office  [phone]

## 2016-05-06 LAB — BASIC METABOLIC PANEL
Anion gap: 7 (ref 5–15)
BUN: 22 mg/dL — ABNORMAL HIGH (ref 6–20)
CO2: 27 mmol/L (ref 22–32)
Calcium: 8.4 mg/dL — ABNORMAL LOW (ref 8.9–10.3)
Chloride: 99 mmol/L — ABNORMAL LOW (ref 101–111)
Creatinine, Ser: 1.4 mg/dL — ABNORMAL HIGH (ref 0.61–1.24)
GFR calc Af Amer: >60 mL/min (ref >60)
GFR calc non Af Amer: 53 mL/min — ABNORMAL LOW (ref >60)
Glucose, Bld: 278 mg/dL — ABNORMAL HIGH (ref 65–99)
Potassium: 4.3 mmol/L (ref 3.5–5.1)
Sodium: 133 mmol/L — ABNORMAL LOW (ref 135–145)

## 2016-05-06 LAB — CBC
HCT: 27.9 % — ABNORMAL LOW (ref 39.0–52.0)
Hemoglobin: 9.1 g/dL — ABNORMAL LOW (ref 13.0–17.0)
MCH: 26.9 pg (ref 26.0–34.0)
MCHC: 32.6 g/dL (ref 30.0–36.0)
MCV: 82.5 fL (ref 78.0–100.0)
Platelets: 281 10*3/uL (ref 150–400)
RBC: 3.38 MIL/uL — ABNORMAL LOW (ref 4.22–5.81)
RDW: 12.7 % (ref 11.5–15.5)
WBC: 9 10*3/uL (ref 4.0–10.5)

## 2016-05-06 LAB — GLUCOSE, CAPILLARY
Glucose-Capillary: 276 mg/dL — ABNORMAL HIGH (ref 65–99)
Glucose-Capillary: 266 mg/dL — ABNORMAL HIGH (ref 65–99)
Glucose-Capillary: 346 mg/dL — ABNORMAL HIGH (ref 65–99)
Glucose-Capillary: 346 mg/dL — ABNORMAL HIGH (ref 65–99)

## 2016-05-06 LAB — HEMOGLOBIN A1C
Hgb A1c MFr Bld: 8.6 % — ABNORMAL HIGH (ref 4.8–5.6)
Mean Plasma Glucose: 200 mg/dL

## 2016-05-06 MED ORDER — INSULIN GLARGINE 100 UNIT/ML ~~LOC~~ SOLN
10.0000 [IU] | Freq: Once | SUBCUTANEOUS | Status: AC
  Administered 2016-05-06: 10 [IU] via SUBCUTANEOUS
  Filled 2016-05-06: qty 0.1

## 2016-05-06 MED ORDER — INSULIN GLARGINE 100 UNIT/ML ~~LOC~~ SOLN
50.0000 [IU] | Freq: Every day | SUBCUTANEOUS | Status: DC
  Administered 2016-05-06: 50 [IU] via SUBCUTANEOUS
  Filled 2016-05-06 (×2): qty 0.5

## 2016-05-06 NOTE — Progress Notes (Signed)
Pharmacy Antibiotic Note  60 year old male continues on broad spectrum antibiotics for diabetic foot infection I+D planned for 8/28 Tmax = 100, Scr = 1.4 Blood cultures negative to date  Plan: Continue Vancomycin 750 mg iv Q 12 hours Continue Cefepime 2 g IV q12 hours Continue Flagyl 500 mg po Q 8  Weight: 243 lb 13.3 oz (110.6 kg)  Temp (24hrs), Avg:99.7 F (37.6 C), Min:98.9 F (37.2 C), Max:100.1 F (37.8 C)   Recent Labs Lab 05/04/16 1250 05/04/16 1934 05/05/16 0632 05/06/16 0424  WBC 9.6 8.9 7.8 9.0  CREATININE 1.20 1.04 1.43* 1.40*    Estimated Creatinine Clearance: 73.4 mL/min (by C-G formula based on SCr of 1.4 mg/dL).    Allergies  Allergen Reactions  . Ivp Dye [Iodinated Diagnostic Agents] Anaphylaxis and Other (See Comments)    Breathing problems   . Adhesive [Tape] Rash    Rash after 2-3 days use    Thank you Anette Guarneri PharmD [phone] 05/06/2016 1:17 PM

## 2016-05-06 NOTE — Progress Notes (Signed)
I have discussed case with [name] who plan to take patient to OR Monday pm for I&D.  [name] to see patient and discuss plan in the morning.   Azucena Cecil, MD [place] 7:30 AM

## 2016-05-06 NOTE — Progress Notes (Signed)
PROGRESS NOTE    EUSTACE HUR  IFO:[id number] DOB: Sep 18, 1955 DOA: 05/04/2016 PCP: [name], MD   Chief Complaint  Patient presents with  . Nausea  . Emesis    Brief Narrative:  HPI on 05/04/2016  [name] is a 60 y.o. male with a medical history of diastolic heart failure, diabetes, coronary artery disease, who presented to the emergency department with complaints of fever, nausea and vomiting. Patient states his symptoms started approximately 3 days ago. He was recently admitted and discharged on 04/26/2016 for osteomyelitis ulceration of the fifth metatarsal and left cuboid. His antibiotics were recently discontinued on 04/30/2016. He has not noticed any discharge from his foot although he does have wound care nursing at home. He began to have diarrhea approximately one day ago with nausea and vomiting however those things of subsided. He has noticed a low-grade fever at home along with chills.  He also endorses malodorous urine with some pain.  Currently he denies any chest pain, shortness of breath, abdominal pain, further diarrhea or constipation, dizziness, headache, recent travel or ill contacts. Assessment & Plan   Left Diabetic foot infection/Osteomyelitis -Patient presented to the emergency department with low-grade fever of 99.12F. -Currently no leukocytosis noted. -Left foot x-ray shows prior amputations, loss of bone along the proximal fourth metatarsal as well as a lateral cuboid, concern for osteomyelitis. No abscess. No acute fracture or dislocation. -Patient recently underwent excision of the base of the fifth metatarsal as well as partial excision of the cuboid. He was recently discharged on 04/26/2016. -He was on antibiotics as an outpatient for these were discontinued on 04/30/2016 -Orthopedics consulted and appreciated and obtained MRI of the foot: 3.2 x 3.6 x 6.1 cm fluid collection concerning for abscess extending to the distal aspect of the cuboid and base  of the fourth metatarsal -Plan is for I&D on 05/07/2016 -Diabetic foot orders set utilized -Continue vancomycin, cefepime, Flagyl -Blood cultures show no growth to date -ESR 98, CRP 8 (elevated)  Chronic diastolic CHF -Echocardiogram 09/27/2014 showed an EF of 67-67%, grade 2 diastolic dysfunction -Monitor intake and output, daily weights -Continue torsemide, Coreg, olmesartan  Essential hypertension -Stable, continue Azor, Coreg, Imdur  Coronary artery disease -Stable, currently no complaints of chest pain. -Continue Coreg, aspirin, Plavix, Imdur, Lipitor, Axor, zetia, statin  Diabetes mellitus, type II with peripheral neuropathy -Hold Glucovance -Continue Lantus, Apidra -Will place on insulin sliding scale CBG monitoring -Last hemoglobin A1c on 04/25/2016 was 9.2, prior to that 6.8 -Continue Lyrica  Chronic pain -Continue home regimen: Fentanyl patch, naproxen, mobile, Dilaudid, oxycodone  Diarrhea -Resolved -Patient endorsed a few episodes of diarrhea on 05/03/2016, prior to admission. Feels this is due to drinking cold apple juice.   -Will discontinue enteric precautions as patient has not had a bowel movement in 2 days.   Dysuria -UA unremarkable for infection, however patient was previously on antibiotics until 04/30/2016 -Patient placed on antibiotics for possible foot osteo  GERD -Continue PPI  DVT Prophylaxis  lovenox  Code Status: Full  Family Communication: None at bedside  Disposition Plan: Admitted. Pending I&D on 8/28.  Consultants [facility]  Procedures  None  Antibiotics   Anti-infectives    Start     Dose/Rate Route Frequency Ordered Stop   05/05/16 0600  vancomycin (VANCOCIN) IVPB 750 mg/150 ml premix     750 mg 150 mL/hr over 60 Minutes Intravenous Every 12 hours 05/04/16 1628     05/04/16 2200  metroNIDAZOLE (FLAGYL) tablet 500 mg  500 mg Oral Every 8 hours 05/04/16 1904     05/04/16 1700  vancomycin (VANCOCIN)  2,000 mg in sodium chloride 0.9 % 500 mL IVPB     2,000 mg 250 mL/hr over 120 Minutes Intravenous  Once 05/04/16 1628 05/04/16 1830   05/04/16 1700  ceFEPIme (MAXIPIME) 2 g in dextrose 5 % 50 mL IVPB     2 g 100 mL/hr over 30 Minutes Intravenous Every 12 hours 05/04/16 1628     05/04/16 1545  metroNIDAZOLE (FLAGYL) IVPB 500 mg  Status:  Discontinued     500 mg 100 mL/hr over 60 Minutes Intravenous Every 8 hours 05/04/16 1537 05/04/16 1908      Subjective:   [name] seen and examined today.  Patient required about his MRI results. Patient hopes that he does not have to have his foot amputated as he will no longer be able to drive. Currently he denies chest pain, shortness of breath, abdominal pain, nausea, vomiting, diarrhea or constipation.   Objective:   Vitals:   05/05/16 1751 05/05/16 2119 05/06/16 0451 05/06/16 0802  BP: (!) 139/57 (!) 145/56 (!) 160/64 (!) 147/68  Pulse: 66 80 77 81  Resp: 18 19 18 18   Temp: 98.9 F (37.2 C) 100.1 F (37.8 C) 100 F (37.8 C) 99.9 F (37.7 C)  TempSrc: Oral Oral Oral Oral  SpO2: 98% 98% 97% 98%  Weight:  110.6 kg (243 lb 13.3 oz)      Intake/Output Summary (Last 24 hours) at 05/06/16 1123 Last data filed at 05/06/16 0900  Gross per 24 hour  Intake             1690 ml  Output             2675 ml  Net             -985 ml   Filed Weights   05/04/16 2052 05/05/16 2119  Weight: 107.5 kg (236 lb 15.9 oz) 110.6 kg (243 lb 13.3 oz)    Exam  General: Well developed, well nourished, NAD  HEENT: NCAT, mucous membranes moist.   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   Neuro: AAOx3, nonfocal  Psych: Normal affect and demeanor with intact judgement and  insight, pleasant   Data Reviewed: I have personally reviewed following labs and imaging studies  CBC:  Recent Labs Lab 05/04/16 1250 05/04/16 1934 05/05/16 0632 05/06/16 0424  WBC 9.6 8.9 7.8 9.0  NEUTROABS 7.6 6.8  --   --   HGB 10.5* 10.3* 9.4* 9.1*  HCT 30.8* 30.7* 29.1* 27.9*  MCV 80.8 81.4 83.4 82.5  PLT 310 320 274 110   Basic Metabolic Panel:  Recent Labs Lab 05/04/16 1250 05/04/16 1934 05/05/16 0632 05/06/16 0424  NA 135 133* 135 133*  K 3.9 3.6 4.0 4.3  CL 100* 101 100* 99*  CO2 27 27 24 27   GLUCOSE 157* 101* 270* 278*  BUN 11 9 18  22*  CREATININE 1.20 1.04 1.43* 1.40*  CALCIUM 8.8* 8.7* 8.4* 8.4*   GFR: Estimated Creatinine Clearance: 73.4 mL/min (by C-G formula based on SCr of 1.4 mg/dL). Liver Function Tests:  Recent Labs Lab 05/04/16 1250  AST 16  ALT 12*  ALKPHOS 107  BILITOT 0.6  PROT 6.2*  ALBUMIN 2.7*   No results for input(s): LIPASE, AMYLASE in the last 168 hours. No results for input(s): AMMONIA in the last 168 hours. Coagulation Profile: No results for input(s): INR, PROTIME in the last 168 hours. Cardiac Enzymes: No results for input(s): CKTOTAL, CKMB, CKMBINDEX, TROPONINI in the last 168 hours. BNP (last 3 results) No results for input(s): PROBNP in the last 8760 hours. HbA1C: No results for input(s): HGBA1C in the last 72 hours. CBG:  Recent Labs Lab 05/05/16 0736 05/05/16 1216 05/05/16 1648 05/05/16 2119 05/06/16 0755  GLUCAP 260* 303* 240* 325* 276*   Lipid Profile: No results for input(s): CHOL, HDL, LDLCALC, TRIG, CHOLHDL, LDLDIRECT in the last 72 hours. Thyroid Function Tests: No results for input(s): TSH, T4TOTAL, FREET4, T3FREE, THYROIDAB in the last 72 hours. Anemia Panel: No results for input(s): VITAMINB12, FOLATE, FERRITIN, TIBC, IRON, RETICCTPCT in the last 72 hours. Urine analysis:    Component Value Date/Time   COLORURINE YELLOW 05/04/2016 [place] 05/04/2016 1610   LABSPEC 1.014  05/04/2016 1610   PHURINE 5.5 05/04/2016 1610   GLUCOSEU 100 (A) 05/04/2016 1610   HGBUR NEGATIVE 05/04/2016 1610   BILIRUBINUR NEGATIVE 05/04/2016 1610   KETONESUR NEGATIVE 05/04/2016 1610   PROTEINUR 100 (A) 05/04/2016 1610   UROBILINOGEN 0.2 01/14/2015 0346   NITRITE NEGATIVE 05/04/2016 1610   LEUKOCYTESUR NEGATIVE 05/04/2016 1610   Sepsis Labs: @LABRCNTIP (procalcitonin:4,lacticidven:4)  ) Recent Results (from the past 240 hour(s))  Blood Cultures x 2 sites     Status: None (Preliminary result)   Collection Time: 05/04/16  7:15 PM  Result Value Ref Range Status   Specimen Description BLOOD RIGHT ANTECUBITAL  Final   Special Requests BOTTLES DRAWN AEROBIC AND ANAEROBIC 10CC  Final   Culture NO GROWTH < 24 HOURS  Final   Report Status PENDING  Incomplete  Blood Cultures x 2 sites     Status: None (Preliminary result)   Collection Time: 05/04/16  7:30 PM  Result Value Ref Range Status   Specimen Description BLOOD BLOOD RIGHT FOREARM  Final   Special Requests BOTTLES DRAWN AEROBIC ONLY 8CC  Final   Culture NO GROWTH < 24 HOURS  Final   Report Status PENDING  Incomplete      Radiology Studies: Dg Chest 2 View  Result Date: 05/04/2016 CLINICAL DATA:  Chest pain EXAM: CHEST  2 VIEW COMPARISON:  12/16/2015 FINDINGS: The heart size appears normal. There is no pleural effusion or edema. No airspace consolidation identified. The lung volumes appear low. There is mild spondylosis within the thoracic spine. IMPRESSION: 1. No acute cardiopulmonary abnormalities. Electronically Signed   By: Kerby Moors M.D.   On: 05/04/2016 13:36   Mr Foot Left W Wo Contrast  Result Date: 05/05/2016 CLINICAL DATA:  Infected left lateral foot. Status post surgery 04/25/2016. EXAM: MRI OF THE LEFT FOREFOOT WITHOUT AND WITH CONTRAST TECHNIQUE: Multiplanar, multisequence MR imaging was performed both before and after administration of intravenous contrast. CONTRAST:  40m MULTIHANCE GADOBENATE DIMEGLUMINE  529 MG/ML IV SOLN COMPARISON:  None. FINDINGS: Bones/Joint/Cartilage Interval resection of the remainder the fifth metatarsal and partial distal cuboid resection. Prior resection of the fourth phalanges. Marrow edema at the base of the fourth metatarsal with mild cortical regularity along the lateral aspect. No acute fracture or dislocation. Normal alignment. No joint effusion. 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 Mild osteoarthritis of the first MTP joint. Ligaments Collateral ligaments are intact. Muscles and Tendons Flexor, extensor and peroneal tendons are  intact. Generalized muscle edema likely reactive. Soft tissue Severe soft tissue edema and enhancement along the lateral aspect of the foot. Large fluid collection measuring 3.2 x 3.6 x 6.1 cm with peripheral enhancement concerning for an abscess extending to the distal aspect of the cuboid and base of the fourth metatarsal. No soft tissue mass. IMPRESSION: 1. Severe soft tissue edema and enhancement along the lateral aspect of the foot. Large fluid collection measuring 3.2 x 3.6 x 6.1 cm with peripheral enhancement concerning for an abscess extending to the distal aspect of the cuboid and base of the fourth metatarsal. Cortical irregularity and edema within the distal aspect of the cuboid and lateral aspect of the base of the fourth metatarsal which may reflect postsurgical changes versus osteomyelitis. 2. 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 Electronically Signed   By: Kathreen Devoid   On: 05/05/2016 15:06   Dg Foot Complete Left  Result Date: 05/04/2016 CLINICAL DATA:  Diabetic with soft tissue wound EXAM: LEFT FOOT - COMPLETE 3+ VIEW COMPARISON:  December 16, 2015 FINDINGS: Frontal, oblique, and lateral views were obtained. There has been interval removal of the remainder of the fifth metatarsal. Currently there is been amputation of the right fifth metatarsal and fifth phalanges as well as the  fourth proximal, middle, distal phalanges. On the current examination, there is soft tissue prominence laterally without radiopaque foreign body. There is subtle loss of bony definition along the lateral aspect of the cuboid bone as well as along the lateral proximal aspect of the fourth metatarsal. These findings are concerning for potential osteomyelitis. There is no acute fracture or dislocation. There is moderate narrowing of all remaining PIP and DIP joints. No erosive change. There is spurring in the dorsal midfoot. There is a spur arising from the inferior calcaneus. There is atherosclerotic calcification anterior to the ankle joint. Note that there is generalized soft tissue edema, particularly in the dorsum of the foot. IMPRESSION: Prior amputations as noted above. Loss of bony definition along the lateral aspect of the proximal fourth metatarsal as well as along the lateral cuboid. These findings raise concern for osteomyelitis in these areas. There is soft tissue prominence lateral to these areas without well-defined soft tissue abscess. There is generalized soft tissue edema most notably dorsally. No acute fracture or dislocation. Joint space narrowing at several sites. MR or three-phase nuclear medicine bone scan could be helpful for further assessment with respect to potential osteomyelitis. Electronically Signed   By: Lowella Grip III M.D.   On: 05/04/2016 14:44     Scheduled Meds: . ALPRAZolam  0.25 mg Oral Daily  . amLODipine  5 mg Oral QHS   And  . irbesartan  150 mg Oral QHS  . aspirin EC  81 mg Oral Daily  . atorvastatin  40 mg Oral Daily  . carvedilol  25 mg Oral Daily  . carvedilol  6.25 mg Oral QHS  . ceFEPime (MAXIPIME) IV  2 g Intravenous Q12H  . clopidogrel  75 mg Oral Daily  . collagenase   Topical Daily  . enoxaparin (LOVENOX) injection  40 mg Subcutaneous Q24H  . ezetimibe  10 mg Oral Daily  . fentaNYL  50 mcg Transdermal Q72H  . HYDROmorphone  2 mg Oral QHS  .  insulin aspart  0-5 Units Subcutaneous QHS  . insulin aspart  0-9 Units Subcutaneous TID WC  . insulin glargine  50 Units Subcutaneous QHS  . isosorbide mononitrate  120 mg Oral Daily  .  meloxicam  15 mg Oral QHS  . metroNIDAZOLE  500 mg Oral Q8H  . multivitamin with minerals  1 tablet Oral Daily  . naproxen sodium  275 mg Oral BID WC  . omega-3 acid ethyl esters  2 g Oral BID  . pantoprazole  40 mg Oral QAC breakfast  . pregabalin  100 mg Oral TID  . rOPINIRole  2 mg Oral QHS  . sodium chloride flush  3 mL Intravenous Q12H  . tamsulosin  0.4 mg Oral Daily  . torsemide  40 mg Oral Daily  . vancomycin  750 mg Intravenous Q12H   Continuous Infusions:    LOS: 1 day   Time Spent in minutes   30 minutes  Sher Hellinger D.O. on 05/06/2016 at 11:22 AM  Between 7am to 7pm - Pager - [phone]  After 7pm go to [email or web address] - password [email or web address]  And look for the night coverage person covering for me after hours  [facility] Office  [phone]

## 2016-05-07 LAB — GLUCOSE, CAPILLARY
Glucose-Capillary: 199 mg/dL — ABNORMAL HIGH (ref 65–99)
Glucose-Capillary: 253 mg/dL — ABNORMAL HIGH (ref 65–99)
Glucose-Capillary: 255 mg/dL — ABNORMAL HIGH (ref 65–99)
Glucose-Capillary: 335 mg/dL — ABNORMAL HIGH (ref 65–99)

## 2016-05-07 LAB — BASIC METABOLIC PANEL
Anion gap: 10 (ref 5–15)
BUN: 21 mg/dL — ABNORMAL HIGH (ref 6–20)
CO2: 23 mmol/L (ref 22–32)
Calcium: 8.5 mg/dL — ABNORMAL LOW (ref 8.9–10.3)
Chloride: 98 mmol/L — ABNORMAL LOW (ref 101–111)
Creatinine, Ser: 1.33 mg/dL — ABNORMAL HIGH (ref 0.61–1.24)
GFR calc Af Amer: >60 mL/min (ref >60)
GFR calc non Af Amer: 56 mL/min — ABNORMAL LOW (ref >60)
Glucose, Bld: 194 mg/dL — ABNORMAL HIGH (ref 65–99)
Potassium: 3.8 mmol/L (ref 3.5–5.1)
Sodium: 131 mmol/L — ABNORMAL LOW (ref 135–145)

## 2016-05-07 LAB — CBC
HCT: 28.8 % — ABNORMAL LOW (ref 39.0–52.0)
Hemoglobin: 9.6 g/dL — ABNORMAL LOW (ref 13.0–17.0)
MCH: 27.4 pg (ref 26.0–34.0)
MCHC: 33.3 g/dL (ref 30.0–36.0)
MCV: 82.1 fL (ref 78.0–100.0)
Platelets: 313 10*3/uL (ref 150–400)
RBC: 3.51 MIL/uL — ABNORMAL LOW (ref 4.22–5.81)
RDW: 12.7 % (ref 11.5–15.5)
WBC: 11 10*3/uL — ABNORMAL HIGH (ref 4.0–10.5)

## 2016-05-07 MED ORDER — INSULIN ASPART 100 UNIT/ML ~~LOC~~ SOLN
8.0000 [IU] | Freq: Three times a day (TID) | SUBCUTANEOUS | Status: DC
  Administered 2016-05-07 – 2016-05-09 (×7): 8 [IU] via SUBCUTANEOUS

## 2016-05-07 MED ORDER — INSULIN GLARGINE 100 UNIT/ML ~~LOC~~ SOLN
54.0000 [IU] | Freq: Every day | SUBCUTANEOUS | Status: DC
  Administered 2016-05-07 – 2016-05-08 (×2): 54 [IU] via SUBCUTANEOUS
  Filled 2016-05-07 (×3): qty 0.54

## 2016-05-07 NOTE — Progress Notes (Signed)
[facility]  Patient Status: Active (receiving services up to time of hospitalization)  [facility] is providing the following services: RN, PT and OT  If patient discharges after hours, please call [phone].   [name] 05/07/2016, 11:52 AM

## 2016-05-07 NOTE — Progress Notes (Signed)
PROGRESS NOTE    [name]  LKG:[id number] DOB: 1956-05-22 DOA: 05/04/2016 PCP: [name], MD   Chief Complaint  Patient presents with  . Nausea  . Emesis    Brief Narrative:  HPI on 05/04/2016  [name] is a 60 y.o. male with a medical history of diastolic heart failure, diabetes, coronary artery disease, who presented to the emergency department with complaints of fever, nausea and vomiting. Patient states his symptoms started approximately 3 days ago. He was recently admitted and discharged on 04/26/2016 for osteomyelitis ulceration of the fifth metatarsal and left cuboid. His antibiotics were recently discontinued on 04/30/2016. He has not noticed any discharge from his foot although he does have wound care nursing at home. He began to have diarrhea approximately one day ago with nausea and vomiting however those things of subsided. He has noticed a low-grade fever at home along with chills.  He also endorses malodorous urine with some pain.  Currently he denies any chest pain, shortness of breath, abdominal pain, further diarrhea or constipation, dizziness, headache, recent travel or ill contacts. Assessment & Plan   Left Diabetic foot infection/Osteomyelitis -Patient presented to the emergency department with low-grade fever of 99.18F. -Currently no leukocytosis noted. -Left foot x-ray shows prior amputations, loss of bone along the proximal fourth metatarsal as well as a lateral cuboid, concern for osteomyelitis. No abscess. No acute fracture or dislocation. -Patient recently underwent excision of the base of the fifth metatarsal as well as partial excision of the cuboid. He was recently discharged on 04/26/2016. -He was on antibiotics as an outpatient for these were discontinued on 04/30/2016 -Orthopedics consulted and appreciated and obtained MRI of the foot: 3.2 x 3.6 x 6.1 cm fluid collection concerning for abscess extending to the distal aspect of the cuboid and base  of the fourth metatarsal -Diabetic foot orders set utilized -Continue vancomycin, cefepime, Flagyl -Blood cultures show no growth to date -ESR 98, CRP 8 (elevated) -[name] assessed patient this morning.  Felt that patient's foot had no swelling, cellulitis, drainage, odor.  Patient to continue IV antibiotics followed by Doxycycline 161m BID for one month.  Strict nonweightbearing LLE. Would like patient to go to SNF.  Chronic diastolic CHF -Echocardiogram 09/27/2014 showed an EF of 566-44% grade 2 diastolic dysfunction -Monitor intake and output, daily weights -Continue torsemide, Coreg, olmesartan  Essential hypertension -Stable, continue Azor, Coreg, Imdur  Coronary artery disease -Stable, currently no complaints of chest pain. -Continue Coreg, aspirin, Plavix, Imdur, Lipitor, Axor, zetia, statin  Diabetes mellitus, type II with peripheral neuropathy -Hold Glucovance -Continue Lantus, Apidra -Continue  insulin sliding scale CBG monitoring -Last hemoglobin A1c on 04/25/2016 was 9.2, prior to that 6.8 -Continue Lyrica -Will increase lantus to 54u, add on premeal coverage  Chronic anemia -Baseline hemoglobin 9-10, Currently 9.6 -Continue to monitor CBC  Chronic pain -Continue home regimen: Fentanyl patch, naproxen, mobile, Dilaudid, oxycodone  Diarrhea -Resolved -Patient endorsed a few episodes of diarrhea on 05/03/2016, prior to admission. Feels this is due to drinking cold apple juice.   -Will discontinue enteric precautions as patient has not had a bowel movement in 2 days.   Dysuria -UA unremarkable for infection, however patient was previously on antibiotics until 04/30/2016 -Patient placed on antibiotics for possible foot osteo  GERD -Continue PPI  DVT Prophylaxis  lovenox  Code Status: Full  Family Communication: None at bedside  Disposition Plan: Admitted. Continue IV antibiotics. PT consulted  Consultants [employer] Procedures    None  Antibiotics   Anti-infectives    Start     Dose/Rate Route Frequency Ordered Stop   05/05/16 0600  vancomycin (VANCOCIN) IVPB 750 mg/150 ml premix     750 mg 150 mL/hr over 60 Minutes Intravenous Every 12 hours 05/04/16 1628     05/04/16 2200  metroNIDAZOLE (FLAGYL) tablet 500 mg     500 mg Oral Every 8 hours 05/04/16 1904     05/04/16 1700  vancomycin (VANCOCIN) 2,000 mg in sodium chloride 0.9 % 500 mL IVPB     2,000 mg 250 mL/hr over 120 Minutes Intravenous  Once 05/04/16 1628 05/04/16 1830   05/04/16 1700  ceFEPIme (MAXIPIME) 2 g in dextrose 5 % 50 mL IVPB     2 g 100 mL/hr over 30 Minutes Intravenous Every 12 hours 05/04/16 1628     05/04/16 1545  metroNIDAZOLE (FLAGYL) IVPB 500 mg  Status:  Discontinued     500 mg 100 mL/hr over 60 Minutes Intravenous Every 8 hours 05/04/16 1537 05/04/16 1908      Subjective:   [name] seen and examined today. Patient has no complaints today. Was seen by [name] this morning, no surgery planned.  Denies chest pain, shortness of breath, abdominal pain, nausea, vomiting, diarrhea or constipation.   Objective:   Vitals:   05/06/16 1700 05/06/16 1950 05/07/16 0531 05/07/16 0940  BP: 136/63 (!) 135/56 (!) 146/61 (!) 142/61  Pulse: 93 82 77 72  Resp: 18 17 20 20   Temp: 98.9 F (37.2 C) 99.3 F (37.4 C) 98.7 F (37.1 C) 99.4 F (37.4 C)  TempSrc: Oral Oral  Oral  SpO2: 97% 97% 99% 98%  Weight:  109.8 kg (242 lb 1 oz)      Intake/Output Summary (Last 24 hours) at 05/07/16 1051 Last data filed at 05/07/16 0844  Gross per 24 hour  Intake              850 ml  Output             2950 ml  Net            -2100 ml   Filed Weights   05/04/16 2052 05/05/16 2119 05/06/16 1950  Weight: 107.5 kg (236 lb 15.9 oz) 110.6 kg (243 lb 13.3 oz) 109.8 kg (242 lb 1 oz)    Exam  General: Well developed, well nourished, NAD  HEENT: NCAT, mucous membranes moist.   Cardiovascular: S1 S2 auscultated, no murmurs, RRR  Respiratory: Clear  to auscultation bilaterally with equal chest rise  Abdomen: Soft, obese, nontender, nondistended, + bowel sounds  Extremities: Right BKA, Left foot wrapped.   Neuro: AAOx3, nonfocal  Psych: Appropriate mood and affect  Data Reviewed: I have personally reviewed following labs and imaging studies  CBC:  Recent Labs Lab 05/04/16 1250 05/04/16 1934 05/05/16 0632 05/06/16 0424 05/07/16 0520  WBC 9.6 8.9 7.8 9.0 11.0*  NEUTROABS 7.6 6.8  --   --   --   HGB 10.5* 10.3* 9.4* 9.1* 9.6*  HCT 30.8* 30.7* 29.1* 27.9* 28.8*  MCV 80.8 81.4 83.4 82.5 82.1  PLT 310 320 274 281 676   Basic Metabolic Panel:  Recent Labs Lab 05/04/16 1250 05/04/16 1934 05/05/16 0632 05/06/16 0424 05/07/16 0520  NA 135 133* 135 133* 131*  K 3.9 3.6 4.0 4.3 3.8  CL 100* 101 100* 99* 98*  CO2 27 27 24 27 23   GLUCOSE 157* 101* 270* 278* 194*  BUN 11 9 18  22* 21*  CREATININE 1.20 1.04 1.43* 1.40* 1.33*  CALCIUM 8.8* 8.7* 8.4* 8.4* 8.5*   GFR: Estimated Creatinine Clearance: 76.9 mL/min (by C-G formula based on SCr of 1.33 mg/dL). Liver Function Tests:  Recent Labs Lab 05/04/16 1250  AST 16  ALT 12*  ALKPHOS 107  BILITOT 0.6  PROT 6.2*  ALBUMIN 2.7*   No results for input(s): LIPASE, AMYLASE in the last 168 hours. No results for input(s): AMMONIA in the last 168 hours. Coagulation Profile: No results for input(s): INR, PROTIME in the last 168 hours. Cardiac Enzymes: No results for input(s): CKTOTAL, CKMB, CKMBINDEX, TROPONINI in the last 168 hours. BNP (last 3 results) No results for input(s): PROBNP in the last 8760 hours. HbA1C:  Recent Labs  05/04/16 1935  HGBA1C 8.6*   CBG:  Recent Labs Lab 05/06/16 0755 05/06/16 1204 05/06/16 1646 05/06/16 2145 05/07/16 0722  GLUCAP 276* 266* 346* 346* 199*   Lipid Profile: No results for input(s): CHOL, HDL, LDLCALC, TRIG, CHOLHDL, LDLDIRECT in the last 72 hours. Thyroid Function Tests: No results for input(s): TSH, T4TOTAL,  FREET4, T3FREE, THYROIDAB in the last 72 hours. Anemia Panel: No results for input(s): VITAMINB12, FOLATE, FERRITIN, TIBC, IRON, RETICCTPCT in the last 72 hours. Urine analysis:    Component Value Date/Time   COLORURINE YELLOW 05/04/2016 [place] 05/04/2016 1610   LABSPEC 1.014 05/04/2016 1610   PHURINE 5.5 05/04/2016 1610   GLUCOSEU 100 (A) 05/04/2016 1610   HGBUR NEGATIVE 05/04/2016 1610   BILIRUBINUR NEGATIVE 05/04/2016 1610   KETONESUR NEGATIVE 05/04/2016 1610   PROTEINUR 100 (A) 05/04/2016 1610   UROBILINOGEN 0.2 01/14/2015 0346   NITRITE NEGATIVE 05/04/2016 1610   LEUKOCYTESUR NEGATIVE 05/04/2016 1610   Sepsis Labs: @LABRCNTIP (procalcitonin:4,lacticidven:4)  ) Recent Results (from the past 240 hour(s))  Blood Cultures x 2 sites     Status: None (Preliminary result)   Collection Time: 05/04/16  7:15 PM  Result Value Ref Range Status   Specimen Description BLOOD RIGHT ANTECUBITAL  Final   Special Requests BOTTLES DRAWN AEROBIC AND ANAEROBIC 10CC  Final   Culture NO GROWTH 2 DAYS  Final   Report Status PENDING  Incomplete  Blood Cultures x 2 sites     Status: None (Preliminary result)   Collection Time: 05/04/16  7:30 PM  Result Value Ref Range Status   Specimen Description BLOOD BLOOD RIGHT FOREARM  Final   Special Requests BOTTLES DRAWN AEROBIC ONLY 8CC  Final   Culture NO GROWTH 2 DAYS  Final   Report Status PENDING  Incomplete      Radiology Studies: Mr Foot Left W Wo Contrast  Result Date: 05/05/2016 CLINICAL DATA:  Infected left lateral foot. Status post surgery 04/25/2016. EXAM: MRI OF THE LEFT FOREFOOT WITHOUT AND WITH CONTRAST TECHNIQUE: Multiplanar, multisequence MR imaging was performed both before and after administration of intravenous contrast. CONTRAST:  78m MULTIHANCE GADOBENATE DIMEGLUMINE 529 MG/ML IV SOLN COMPARISON:  None. FINDINGS: Bones/Joint/Cartilage Interval resection of the remainder the fifth metatarsal and partial distal  cuboid resection. Prior resection of the fourth phalanges. Marrow edema at the base of the fourth metatarsal with mild cortical regularity along the lateral aspect. No acute fracture or dislocation. Normal alignment. No joint effusion. 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 Mild osteoarthritis of the first MTP joint. Ligaments Collateral ligaments are intact. Muscles and Tendons Flexor, extensor and peroneal tendons are intact. Generalized muscle edema likely reactive. Soft tissue Severe soft tissue edema and enhancement along the lateral aspect of  the foot. Large fluid collection measuring 3.2 x 3.6 x 6.1 cm with peripheral enhancement concerning for an abscess extending to the distal aspect of the cuboid and base of the fourth metatarsal. No soft tissue mass. IMPRESSION: 1. Severe soft tissue edema and enhancement along the lateral aspect of the foot. Large fluid collection measuring 3.2 x 3.6 x 6.1 cm with peripheral enhancement concerning for an abscess extending to the distal aspect of the cuboid and base of the fourth metatarsal. Cortical irregularity and edema within the distal aspect of the cuboid and lateral aspect of the base of the fourth metatarsal which may reflect postsurgical changes versus osteomyelitis. 2. 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 Electronically Signed   By: Kathreen Devoid   On: 05/05/2016 15:06     Scheduled Meds: . ALPRAZolam  0.25 mg Oral Daily  . amLODipine  5 mg Oral QHS   And  . irbesartan  150 mg Oral QHS  . aspirin EC  81 mg Oral Daily  . atorvastatin  40 mg Oral Daily  . carvedilol  25 mg Oral Daily  . carvedilol  6.25 mg Oral QHS  . ceFEPime (MAXIPIME) IV  2 g Intravenous Q12H  . clopidogrel  75 mg Oral Daily  . collagenase   Topical Daily  . enoxaparin (LOVENOX) injection  40 mg Subcutaneous Q24H  . ezetimibe  10 mg Oral Daily  . fentaNYL  50 mcg Transdermal Q72H  . HYDROmorphone  2 mg Oral QHS  .  insulin aspart  0-5 Units Subcutaneous QHS  . insulin aspart  0-9 Units Subcutaneous TID WC  . insulin glargine  50 Units Subcutaneous QHS  . isosorbide mononitrate  120 mg Oral Daily  . meloxicam  15 mg Oral QHS  . metroNIDAZOLE  500 mg Oral Q8H  . multivitamin with minerals  1 tablet Oral Daily  . naproxen sodium  275 mg Oral BID WC  . omega-3 acid ethyl esters  2 g Oral BID  . pantoprazole  40 mg Oral QAC breakfast  . pregabalin  100 mg Oral TID  . rOPINIRole  2 mg Oral QHS  . sodium chloride flush  3 mL Intravenous Q12H  . tamsulosin  0.4 mg Oral Daily  . torsemide  40 mg Oral Daily  . vancomycin  750 mg Intravenous Q12H   Continuous Infusions:    LOS: 2 days   Time Spent in minutes   30 minutes  [name] D.O. on 05/07/2016 at 10:51 AM  Between 7am to 7pm - Pager - [phone]  After 7pm go to [email or web address] - password [email or web address]  And look for the night coverage person covering for me after hours  [facility] Office  [phone]

## 2016-05-07 NOTE — Progress Notes (Signed)
PT Cancellation Note  Patient Details Name: [name] MRN: [id number] DOB: 05/10/1956   Cancelled Treatment:    Reason Eval/Treat Not Completed: Pain limiting ability to participate   Second attempt to work with pt; reports he feels lousy and requests PT return tomorrow for full mobility eval;   Roney Marion, [place] Pager [phone] Office [phone]    Roney Marion [facility] 05/07/2016, 2:35 PM

## 2016-05-07 NOTE — Progress Notes (Signed)
Inpatient Diabetes Program Recommendations  AACE/ADA: New Consensus Statement on Inpatient Glycemic Control (2015)  Target Ranges:  Prepandial:   less than 140 mg/dL      Peak postprandial:   less than 180 mg/dL (1-2 hours)      Critically ill patients:  140 - 180 mg/dL   Results for GAEGE, DAN (MRN [id number]) as of 05/07/2016 08:58  Ref. Range 05/06/2016 07:55 05/06/2016 12:04 05/06/2016 16:46 05/06/2016 21:45 05/07/2016 07:22  Glucose-Capillary Latest Ref Range: 65 - 99 mg/dL 276 (H) 266 (H) 346 (H) 346 (H) 199 (H)   Review of Glycemic Control  Diabetes history: DM2 Outpatient Diabetes medications: Lantus 40 units QHS, Apidra 10 units TID with meals, Glucovance 01-999 mg BID Current orders for Inpatient glycemic control: Lantus 50 units QHS, Novolog 0-9 units TID with meals, Novolog 0-5 units QHS  Inpatient Diabetes Program Recommendations: Insulin - Basal: Please consider increasing Lantus to 54 units QHS. Insulin - Meal Coverage: Please consider ordering Novolog 8 units TID with meals for meal coverage (in addition to Novolog correction).  Thanks, Barnie Alderman, RN, MSN, CDE Diabetes Coordinator Inpatient Diabetes Program [phone] (Team Pager from [place] to [place]) [phone] ([place] office) [phone] [facility] office) [phone] [facility] office)

## 2016-05-07 NOTE — Progress Notes (Addendum)
Patient ID: [name], male   DOB: [birth year], 60 y.o.   MRN: [id number] Patient is status post revision lateral column amputation left foot. Examination the patient's foot there is no swelling in the foot or ankle the skin wrinkles well there is no cellulitis no odor no drainage the wound is well approximated. Review of the radiographs and the MRI scan is consistent with postsurgical changes no clinical signs of infection at this time. Recommend continuing IV antibiotics for several days and then discharged to home on oral doxycycline 100 mg twice a day for a month. Patient should be strict nonweightbearing on the left lower extremity. Continue dry dressing changes daily.  Orders written to resume diet and physical therapy strict nonweightbearing left lower extremity. Orders also written for a short-term skilled nursing placement. With  right transtibial amputation he may not be able to maintain nonweightbearing on the left lower extremity.

## 2016-05-08 ENCOUNTER — Inpatient Hospital Stay (HOSPITAL_COMMUNITY): Payer: BLUE CROSS/BLUE SHIELD

## 2016-05-08 DIAGNOSIS — L8990 Pressure ulcer of unspecified site, unspecified stage: Secondary | ICD-10-CM | POA: Insufficient documentation

## 2016-05-08 LAB — BASIC METABOLIC PANEL
Anion gap: 8 (ref 5–15)
BUN: 26 mg/dL — ABNORMAL HIGH (ref 6–20)
CO2: 27 mmol/L (ref 22–32)
Calcium: 8.6 mg/dL — ABNORMAL LOW (ref 8.9–10.3)
Chloride: 99 mmol/L — ABNORMAL LOW (ref 101–111)
Creatinine, Ser: 1.42 mg/dL — ABNORMAL HIGH (ref 0.61–1.24)
GFR calc Af Amer: >60 mL/min (ref >60)
GFR calc non Af Amer: 52 mL/min — ABNORMAL LOW (ref >60)
Glucose, Bld: 185 mg/dL — ABNORMAL HIGH (ref 65–99)
Potassium: 4.1 mmol/L (ref 3.5–5.1)
Sodium: 134 mmol/L — ABNORMAL LOW (ref 135–145)

## 2016-05-08 LAB — VANCOMYCIN, TROUGH: Vancomycin Tr: 20 ug/mL (ref 15–20)

## 2016-05-08 LAB — GLUCOSE, CAPILLARY
Glucose-Capillary: 185 mg/dL — ABNORMAL HIGH (ref 65–99)
Glucose-Capillary: 277 mg/dL — ABNORMAL HIGH (ref 65–99)
Glucose-Capillary: 288 mg/dL — ABNORMAL HIGH (ref 65–99)
Glucose-Capillary: 290 mg/dL — ABNORMAL HIGH (ref 65–99)

## 2016-05-08 LAB — CBC
HCT: 25.7 % — ABNORMAL LOW (ref 39.0–52.0)
Hemoglobin: 8.7 g/dL — ABNORMAL LOW (ref 13.0–17.0)
MCH: 27.5 pg (ref 26.0–34.0)
MCHC: 33.9 g/dL (ref 30.0–36.0)
MCV: 81.3 fL (ref 78.0–100.0)
Platelets: 342 10*3/uL (ref 150–400)
RBC: 3.16 MIL/uL — ABNORMAL LOW (ref 4.22–5.81)
RDW: 12.8 % (ref 11.5–15.5)
WBC: 10.7 10*3/uL — ABNORMAL HIGH (ref 4.0–10.5)

## 2016-05-08 MED ORDER — IPRATROPIUM-ALBUTEROL 0.5-2.5 (3) MG/3ML IN SOLN
3.0000 mL | Freq: Four times a day (QID) | RESPIRATORY_TRACT | Status: DC
  Administered 2016-05-08 (×2): 3 mL via RESPIRATORY_TRACT
  Filled 2016-05-08 (×3): qty 3

## 2016-05-08 NOTE — Progress Notes (Signed)
Pharmacy Antibiotic Note  60 year old male continues on broad spectrum antibiotics, IV  Vancomycin and Cefepime for diabetic foot infection. Per left foot xRay, concern for osteomyelitis and no abscess per left foot.   [name], ortho assessed patient on 8/28 AM, felt patient's foot had no swelling, cellulitis, drainage, odorj.  Tc =afebrile Tmax = 99.4, WBC 10.7, Scr = 1.42 stable but increased from admit 1.04, CrCl ~ 71 ml/min, I/O 1200/+880 Blood cultures negative to date.  Vancomycin trough steady state drawn this AM = 74mcg/ml , level drawn ~1 hr early,  Therapeutic trough on vanc 750 mg IV q12h.  8/29: Vanco Trough today = 20 mcg/ml  Goal 15-20 mcg/ml  Plan: Continue Vancomycin 750 mg iv Q 12 hours Continue Cefepime 2 g IV q12 hours Continues on Flagyl 500 mg po Q 8h per MD.  Weight: 241 lb 8 oz (109.5 kg)  Temp (24hrs), Avg:98.9 F (37.2 C), Min:98.5 F (36.9 C), Max:99.3 F (37.4 C)   Recent Labs Lab 05/04/16 1934 05/05/16 0632 05/06/16 0424 05/07/16 0520 05/08/16 0835  WBC 8.9 7.8 9.0 11.0* 10.7*  CREATININE 1.04 1.43* 1.40* 1.33* 1.42*  VANCOTROUGH  --   --   --   --  20    Estimated Creatinine Clearance: 71.9 mL/min (by C-G formula based on SCr of 1.42 mg/dL).    Allergies  Allergen Reactions  . Ivp Dye [Iodinated Diagnostic Agents] Anaphylaxis and Other (See Comments)    Breathing problems   . Adhesive [Tape] Rash    Rash after 2-3 days use     Antimicrobials this admission: Vancomycin 8/25>> Cefepime 8/25>> Metronidazole 8/25>>  Microbiology results: 8/25 Cdiff: cancelled 8/25 Blood Cx  X2: ngtd   Thank you Nicole Cella, RPh Clinical Pharmacist Pager: [phone] 05/08/2016 9:55 AM

## 2016-05-08 NOTE — Progress Notes (Signed)
PROGRESS NOTE    [name]  KXF:[id number] DOB: 10-29-1955 DOA: 05/04/2016 PCP: [name], MD   Chief Complaint  Patient presents with  . Nausea  . Emesis    Brief Narrative:  HPI on 05/04/2016  [name] is a 60 y.o. male with a medical history of diastolic heart failure, diabetes, coronary artery disease, who presented to the emergency department with complaints of fever, nausea and vomiting. Patient states his symptoms started approximately 3 days ago. He was recently admitted and discharged on 04/26/2016 for osteomyelitis ulceration of the fifth metatarsal and left cuboid. His antibiotics were recently discontinued on 04/30/2016. He has not noticed any discharge from his foot although he does have wound care nursing at home. He began to have diarrhea approximately one day ago with nausea and vomiting however those things of subsided. He has noticed a low-grade fever at home along with chills.  He also endorses malodorous urine with some pain.  Currently he denies any chest pain, shortness of breath, abdominal pain, further diarrhea or constipation, dizziness, headache, recent travel or ill contacts.  Interim history MRI done showing fluid collection. [name] stated he would like to continue IV antibiotics for a few days followed by by mouth antibiotics and outpatient follow-up.  Assessment & Plan   Left Diabetic foot infection/Osteomyelitis -Patient presented to the emergency department with low-grade fever of 99.42F.  -Left foot x-ray shows prior amputations, loss of bone along the proximal fourth metatarsal as well as a lateral cuboid, concern for osteomyelitis. No abscess. No acute fracture or dislocation. -Patient recently underwent excision of the base of the fifth metatarsal as well as partial excision of the cuboid. He was recently discharged on 04/26/2016. -He was on antibiotics as an outpatient for these were discontinued on 04/30/2016 -Orthopedics consulted and  appreciated and obtained MRI of the foot: 3.2 x 3.6 x 6.1 cm fluid collection concerning for abscess extending to the distal aspect of the cuboid and base of the fourth metatarsal -Diabetic foot orders set utilized -Continue vancomycin, cefepime, Flagyl -Blood cultures show no growth to date -ESR 98, CRP 8 (elevated) -[name] assessed patient this morning.  Felt that patient's foot had no swelling, cellulitis, drainage, odor.  Patient to continue IV antibiotics followed by Doxycycline 145m BID for one month.  Strict nonweightbearing LLE. Would like patient to go to SNF.  Chronic diastolic CHF -Echocardiogram 09/27/2014 showed an EF of 569-67% grade 2 diastolic dysfunction -Monitor intake and output, daily weights -Continue torsemide, Coreg, olmesartan  Essential hypertension -Stable, continue Azor, Coreg, Imdur  Coronary artery disease -Stable, currently no complaints of chest pain. -Continue Coreg, aspirin, Plavix, Imdur, Lipitor, Axor, zetia, statin  Diabetes mellitus, type II with peripheral neuropathy -Hold Glucovance -Continue Lantus, Apidra -Continue  insulin sliding scale CBG monitoring -Last hemoglobin A1c on 04/25/2016 was 9.2, prior to that 6.8 -Continue Lyrica -increased lantus to 54u, added premeal coverage  Chronic anemia -Baseline hemoglobin 9-10, Currently 8.7 -Continue to monitor CBC  Chronic pain -Continue home regimen: Fentanyl patch, naproxen, mobile, Dilaudid, oxycodone  Diarrhea -Resolved -Patient endorsed a few episodes of diarrhea on 05/03/2016, prior to admission. Feels this is due to drinking cold apple juice.   -Will discontinue enteric precautions as patient has not had a bowel movement in 2 days.   Dysuria -UA unremarkable for infection, however patient was previously on antibiotics until 04/30/2016 -Patient placed on antibiotics for possible foot osteo  GERD -Continue PPI  DVT Prophylaxis  lovenox  Code Status:  Full  Family  Communication: None at bedside  Disposition Plan: Admitted. Continue IV antibiotics. PT consulted and pending  Consultants [facility]  Procedures  None  Antibiotics   Anti-infectives    Start     Dose/Rate Route Frequency Ordered Stop   05/05/16 0600  vancomycin (VANCOCIN) IVPB 750 mg/150 ml premix     750 mg 150 mL/hr over 60 Minutes Intravenous Every 12 hours 05/04/16 1628     05/04/16 2200  metroNIDAZOLE (FLAGYL) tablet 500 mg     500 mg Oral Every 8 hours 05/04/16 1904     05/04/16 1700  vancomycin (VANCOCIN) 2,000 mg in sodium chloride 0.9 % 500 mL IVPB     2,000 mg 250 mL/hr over 120 Minutes Intravenous  Once 05/04/16 1628 05/04/16 1830   05/04/16 1700  ceFEPIme (MAXIPIME) 2 g in dextrose 5 % 50 mL IVPB     2 g 100 mL/hr over 30 Minutes Intravenous Every 12 hours 05/04/16 1628     05/04/16 1545  metroNIDAZOLE (FLAGYL) IVPB 500 mg  Status:  Discontinued     500 mg 100 mL/hr over 60 Minutes Intravenous Every 8 hours 05/04/16 1537 05/04/16 1908      Subjective:   [name] seen and examined today. Patient states he feels horrible today.  Cannot elaborate on this.  Denies chest pain, shortness of breath, abdominal pain, nausea, vomiting, diarrhea or constipation.   Objective:   Vitals:   05/07/16 1759 05/07/16 2020 05/08/16 0413 05/08/16 0900  BP: 138/66 (!) 136/55 128/61 (!) 141/70  Pulse: 74 83 69 65  Resp: _0 Temp: 98.8 F (37.1 C) 99.3 F (37.4 C) 98.5 F (36.9 C) 98.7 F (37.1 C)  TempSrc: Oral Oral Oral Oral  SpO2: 98% 99% 98% 98%  Weight:  109.5 kg (241 lb 8 oz)      Intake/Output Summary (Last 24 hours) at 05/08/16 1051 Last data filed at 05/08/16 0900  Gross per 24 hour  Intake             1960 ml  Output             1800 ml  Net              160 ml   Filed Weights   05/05/16 2119 05/06/16 1950 05/07/16 2020  Weight: 110.6 kg (243 lb 13.3 oz) 109.8 kg (242 lb 1 oz) 109.5 kg (241 lb 8 oz)    Exam  General: Well  developed, well nourished, No distress  HEENT: NCAT, mucous membranes moist.   Cardiovascular: S1 S2 auscultated, no murmurs, RRR  Respiratory: Mild exp wheezing noted, good air entry  Abdomen: Soft, obese, nontender, nondistended, + bowel sounds  Extremities: Right BKA, Left foot wrapped.   Neuro: AAOx3, nonfocal  Psych: Appropriate mood and affect  Data Reviewed: I have personally reviewed following labs and imaging studies  CBC:  Recent Labs Lab 05/04/16 1250 05/04/16 1934 05/05/16 0632 05/06/16 0424 05/07/16 0520 05/08/16 0835  WBC 9.6 8.9 7.8 9.0 11.0* 10.7*  NEUTROABS 7.6 6.8  --   --   --   --   HGB 10.5* 10.3* 9.4* 9.1* 9.6* 8.7*  HCT 30.8* 30.7* 29.1* 27.9* 28.8* 25.7*  MCV 80.8 81.4 83.4 82.5 82.1 81.3  PLT 310 320 274 281 313 329   Basic Metabolic Panel:  Recent Labs Lab 05/04/16 1934 05/05/16 0632 05/06/16 0424 05/07/16 0520 05/08/16 0835  NA 133* 135 133* 131* 134*  K  3.6 4.0 4.3 3.8 4.1  CL 101 100* 99* 98* 99*  CO2 _0 GLUCOSE 101* 270* 278* 194* 185*  BUN 9 18 22* 21* 26*  CREATININE 1.04 1.43* 1.40* 1.33* 1.42*  CALCIUM 8.7* 8.4* 8.4* 8.5* 8.6*   GFR: Estimated Creatinine Clearance: 71.9 mL/min (by C-G formula based on SCr of 1.42 mg/dL). Liver Function Tests:  Recent Labs Lab 05/04/16 1250  AST 16  ALT 12*  ALKPHOS 107  BILITOT 0.6  PROT 6.2*  ALBUMIN 2.7*   No results for input(s): LIPASE, AMYLASE in the last 168 hours. No results for input(s): AMMONIA in the last 168 hours. Coagulation Profile: No results for input(s): INR, PROTIME in the last 168 hours. Cardiac Enzymes: No results for input(s): CKTOTAL, CKMB, CKMBINDEX, TROPONINI in the last 168 hours. BNP (last 3 results) No results for input(s): PROBNP in the last 8760 hours. HbA1C: No results for input(s): HGBA1C in the last 72 hours. CBG:  Recent Labs Lab 05/07/16 0722 05/07/16 1130 05/07/16 1756 05/07/16 2019 05/08/16 0750  GLUCAP 199* 253*  255* 335* 185*   Lipid Profile: No results for input(s): CHOL, HDL, LDLCALC, TRIG, CHOLHDL, LDLDIRECT in the last 72 hours. Thyroid Function Tests: No results for input(s): TSH, T4TOTAL, FREET4, T3FREE, THYROIDAB in the last 72 hours. Anemia Panel: No results for input(s): VITAMINB12, FOLATE, FERRITIN, TIBC, IRON, RETICCTPCT in the last 72 hours. Urine analysis:    Component Value Date/Time   COLORURINE YELLOW 05/04/2016 1610   APPEARANCEUR CLEAR 05/04/2016 1610   LABSPEC 1.014 05/04/2016 1610   PHURINE 5.5 05/04/2016 1610   GLUCOSEU 100 (A) 05/04/2016 1610   HGBUR NEGATIVE 05/04/2016 1610   BILIRUBINUR NEGATIVE 05/04/2016 1610   KETONESUR NEGATIVE 05/04/2016 1610   PROTEINUR 100 (A) 05/04/2016 1610   UROBILINOGEN 0.2 01/14/2015 0346   NITRITE NEGATIVE 05/04/2016 1610   LEUKOCYTESUR NEGATIVE 05/04/2016 1610   Sepsis Labs: _1 (procalcitonin:4,lacticidven:4)  ) Recent Results (from the past 240 hour(s))  Blood Cultures x 2 sites     Status: None (Preliminary result)   Collection Time: 05/04/16  7:15 PM  Result Value Ref Range Status   Specimen Description BLOOD RIGHT ANTECUBITAL  Final   Special Requests BOTTLES DRAWN AEROBIC AND ANAEROBIC 10CC  Final   Culture NO GROWTH 4 DAYS  Final   Report Status PENDING  Incomplete  Blood Cultures x 2 sites     Status: None (Preliminary result)   Collection Time: 05/04/16  7:30 PM  Result Value Ref Range Status   Specimen Description BLOOD BLOOD RIGHT FOREARM  Final   Special Requests BOTTLES DRAWN AEROBIC ONLY 8CC  Final   Culture NO GROWTH 4 DAYS  Final   Report Status PENDING  Incomplete      Radiology Studies: No results found.   Scheduled Meds: . ALPRAZolam  0.25 mg Oral Daily  . amLODipine  5 mg Oral QHS   And  . irbesartan  150 mg Oral QHS  . aspirin EC  81 mg Oral Daily  . atorvastatin  40 mg Oral Daily  . carvedilol  25 mg Oral Daily  . carvedilol  6.25 mg Oral QHS  . ceFEPime (MAXIPIME) IV  2 g  Intravenous Q12H  . clopidogrel  75 mg Oral Daily  . collagenase   Topical Daily  . enoxaparin (LOVENOX) injection  40 mg Subcutaneous Q24H  . ezetimibe  10 mg Oral Daily  . fentaNYL  50 mcg Transdermal Q72H  . HYDROmorphone  2 mg  Oral QHS  . insulin aspart  0-5 Units Subcutaneous QHS  . insulin aspart  0-9 Units Subcutaneous TID WC  . insulin aspart  8 Units Subcutaneous TID WC  . insulin glargine  54 Units Subcutaneous QHS  . ipratropium-albuterol  3 mL Nebulization Q6H  . isosorbide mononitrate  120 mg Oral Daily  . meloxicam  15 mg Oral QHS  . metroNIDAZOLE  500 mg Oral Q8H  . multivitamin with minerals  1 tablet Oral Daily  . naproxen sodium  275 mg Oral BID WC  . omega-3 acid ethyl esters  2 g Oral BID  . pantoprazole  40 mg Oral QAC breakfast  . pregabalin  100 mg Oral TID  . rOPINIRole  2 mg Oral QHS  . sodium chloride flush  3 mL Intravenous Q12H  . tamsulosin  0.4 mg Oral Daily  . torsemide  40 mg Oral Daily  . vancomycin  750 mg Intravenous Q12H   Continuous Infusions:    LOS: 3 days   Time Spent in minutes   30 minutes  Chima Astorino D.O. on 05/08/2016 at 10:51 AM  Between 7am to 7pm - Pager - [phone]  After 7pm go to [email or web address] - password [email or web address]  And look for the night coverage person covering for me after hours  [facility] Office  [phone]

## 2016-05-08 NOTE — Evaluation (Signed)
Physical Therapy Evaluation Patient Details Name: [name] MRN: [id number] DOB: 1956-01-26 Today's Date: 05/08/2016   History of Present Illness  [name] is a 60 y.o. male with a medical history of diastolic heart failure, diabetes, coronary artery disease, who presented to the emergency department with complaints of fever, nausea and vomiting. Patient states his symptoms started approximately 3 days ago. He was recently admitted and discharged on 04/26/2016 for osteomyelitis ulceration of the fifth metatarsal and left cuboid. His antibiotics were recently discontinued on 04/30/2016. Left Diabetic foot infection/osteomyelitis; Strict NWB LLE; PMH of RBKA (as of eval on 8/29, prosthesis not in room)  Clinical Impression   Pt admitted with above diagnosis. Pt currently with functional limitations due to the deficits listed below (see PT Problem List).   Performed lateral scoot transfer bed>recliner>bed with min assist; Educated pt on options for transfers still maintaining NWB LLE, and without RLE prosthesis (lateral scoot, anterior posterior); [name] was minimally receptive to suggestions, but did ultimately perform transfer;    Pt will benefit from skilled PT to increase their independence and safety with mobility to allow discharge to the venue listed below.       Follow Up Recommendations SNF; [name] mentioned SNF, and it is not unreasonable for more training in transfers with an eye for safety and keeping NWB; Pt tells me he will consider and speak to his wife about SNF for rehab stay; He DOES have good rehab potential, especially if he can use his R prosthesis; Will continue to watch progress -- he may be able to dc home if he shows better safety awareness and progress    Equipment Recommendations  Other (comment) (Will consider sliding board) Drop-arm BSC   Recommendations for Other Services OT consult     Precautions / Restrictions Precautions Precautions:  Fall Restrictions LLE Weight Bearing: Non weight bearing      Mobility  Bed Mobility Overal bed mobility: Needs Assistance Bed Mobility: Supine to Sit     Supine to sit: Min guard;HOB elevated Sit to supine: Min guard   General bed mobility comments: Used bedrails to pull to semi-long-sit  Transfers Overall transfer level: Needs assistance   Transfers: Lateral/Scoot Transfers          Lateral/Scoot Transfers: Min assist General transfer comment: Verbal and demo cues prior to practice; transferred to and from recliner on his Right with  R drop-arm down; cues for technique, and encouraged pt to leave LLE on the bed to keep from bearing weight; noting overall good use of UEs to push up for scooting; Ultimately he chose to perform lateral scoot transfer back to bed; needing min assist to steady chair  Ambulation/Gait                Stairs            Wheelchair Mobility    Modified Rankin (Stroke Patients Only)       Balance     Sitting balance-Leahy Scale: Fair                                       Pertinent Vitals/Pain Pain Assessment: Faces Faces Pain Scale: Hurts little more Pain Location: L foot Pain Descriptors / Indicators:  (did not describe when asked) Pain Intervention(s): Monitored during session;Other (comment) (elevated foot at end of session)    Home Living Family/patient expects to be discharged  to:: Private residence Living Arrangements: Spouse/significant other;Children Available Help at Discharge: Family;Available 24 hours/day Type of Home: Mobile home Home Access: Ramped entrance     Home Layout: One level Home Equipment: [place] - 2 wheels;Cane - single point;Shower seat;Wheelchair - Education officer, community - power      Prior Function Level of Independence: Independent with assistive device(s)         Comments: pt stated he was using the RW for ambulation prior to recent amputation     Hand Dominance    Dominant Hand: Right    Extremity/Trunk Assessment   Upper Extremity Assessment: Overall WFL for tasks assessed           Lower Extremity Assessment: RLE deficits/detail;LLE deficits/detail RLE Deficits / Details: R BKA, no prosthesis in room; evidence of hamstring tightness LLE Deficits / Details: limited by pain; noted evidence of hamstring tightness with difficulty sitting straight and upright when knee is extended     Communication   Communication: HOH  Cognition Arousal/Alertness: Awake/alert Behavior During Therapy: WFL for tasks assessed/performed (Somewhat resistant to suggestions) Overall Cognitive Status: No family/caregiver present to determine baseline cognitive functioning                      General Comments      Exercises        Assessment/Plan    PT Assessment Patient needs continued PT services  PT Diagnosis Acute pain;Other (comment) (Decr functional mobility)   PT Problem List Decreased strength;Decreased range of motion;Decreased activity tolerance;Decreased balance;Decreased mobility;Decreased knowledge of use of DME;Decreased safety awareness;Decreased knowledge of precautions;Pain;Decreased skin integrity  PT Treatment Interventions DME instruction;Functional mobility training;Therapeutic activities;Therapeutic exercise;Balance training;Neuromuscular re-education;Patient/family education;Wheelchair mobility training   PT Goals (Current goals can be found in the Care Plan section) Acute Rehab PT Goals Patient Stated Goal: Did not state PT Goal Formulation:  (Pt did not participate in dc planning) Time For Goal Achievement: 05/22/16 Potential to Achieve Goals: Good    Frequency Min 3X/week   Barriers to discharge        Co-evaluation               End of Session   Activity Tolerance: Patient tolerated treatment well Patient left: in bed;with call bell/phone within reach Nurse Communication: Mobility status;Weight bearing  status         Time: 1200-1219 PT Time Calculation (min) (ACUTE ONLY): 19 min   Charges:   PT Evaluation $PT Eval Moderate Complexity: 1 Procedure     PT G CodesRoney Marion Hamff 05/08/2016, 2:07 PM   Roney Marion, [place] Pager [phone] Office [phone]

## 2016-05-09 DIAGNOSIS — E1142 Type 2 diabetes mellitus with diabetic polyneuropathy: Secondary | ICD-10-CM

## 2016-05-09 DIAGNOSIS — E1140 Type 2 diabetes mellitus with diabetic neuropathy, unspecified: Secondary | ICD-10-CM

## 2016-05-09 DIAGNOSIS — E1165 Type 2 diabetes mellitus with hyperglycemia: Secondary | ICD-10-CM

## 2016-05-09 DIAGNOSIS — M86172 Other acute osteomyelitis, left ankle and foot: Secondary | ICD-10-CM

## 2016-05-09 DIAGNOSIS — E119 Type 2 diabetes mellitus without complications: Secondary | ICD-10-CM

## 2016-05-09 DIAGNOSIS — M86672 Other chronic osteomyelitis, left ankle and foot: Secondary | ICD-10-CM

## 2016-05-09 DIAGNOSIS — M86179 Other acute osteomyelitis, unspecified ankle and foot: Secondary | ICD-10-CM

## 2016-05-09 DIAGNOSIS — I5032 Chronic diastolic (congestive) heart failure: Secondary | ICD-10-CM

## 2016-05-09 DIAGNOSIS — L089 Local infection of the skin and subcutaneous tissue, unspecified: Secondary | ICD-10-CM

## 2016-05-09 DIAGNOSIS — Z794 Long term (current) use of insulin: Secondary | ICD-10-CM

## 2016-05-09 DIAGNOSIS — E1169 Type 2 diabetes mellitus with other specified complication: Principal | ICD-10-CM

## 2016-05-09 LAB — CULTURE, BLOOD (ROUTINE X 2)
Culture: NO GROWTH
Culture: NO GROWTH

## 2016-05-09 LAB — CBC
HCT: 26 % — ABNORMAL LOW (ref 39.0–52.0)
Hemoglobin: 8.7 g/dL — ABNORMAL LOW (ref 13.0–17.0)
MCH: 27.4 pg (ref 26.0–34.0)
MCHC: 33.5 g/dL (ref 30.0–36.0)
MCV: 81.8 fL (ref 78.0–100.0)
Platelets: 307 10*3/uL (ref 150–400)
RBC: 3.18 MIL/uL — ABNORMAL LOW (ref 4.22–5.81)
RDW: 12.6 % (ref 11.5–15.5)
WBC: 7.9 10*3/uL (ref 4.0–10.5)

## 2016-05-09 LAB — GLUCOSE, CAPILLARY
Glucose-Capillary: 192 mg/dL — ABNORMAL HIGH (ref 65–99)
Glucose-Capillary: 213 mg/dL — ABNORMAL HIGH (ref 65–99)
Glucose-Capillary: 380 mg/dL — ABNORMAL HIGH (ref 65–99)
Glucose-Capillary: 359 mg/dL — ABNORMAL HIGH (ref 65–99)
Glucose-Capillary: 390 mg/dL — ABNORMAL HIGH (ref 65–99)

## 2016-05-09 LAB — BASIC METABOLIC PANEL
Anion gap: 10 (ref 5–15)
BUN: 27 mg/dL — ABNORMAL HIGH (ref 6–20)
CO2: 28 mmol/L (ref 22–32)
Calcium: 8.5 mg/dL — ABNORMAL LOW (ref 8.9–10.3)
Chloride: 96 mmol/L — ABNORMAL LOW (ref 101–111)
Creatinine, Ser: 1.41 mg/dL — ABNORMAL HIGH (ref 0.61–1.24)
GFR calc Af Amer: >60 mL/min (ref >60)
GFR calc non Af Amer: 52 mL/min — ABNORMAL LOW (ref >60)
Glucose, Bld: 241 mg/dL — ABNORMAL HIGH (ref 65–99)
Potassium: 4.2 mmol/L (ref 3.5–5.1)
Sodium: 134 mmol/L — ABNORMAL LOW (ref 135–145)

## 2016-05-09 MED ORDER — INSULIN ASPART 100 UNIT/ML ~~LOC~~ SOLN
0.0000 [IU] | Freq: Every day | SUBCUTANEOUS | Status: DC
  Administered 2016-05-09: 5 [IU] via SUBCUTANEOUS
  Administered 2016-05-10: 4 [IU] via SUBCUTANEOUS

## 2016-05-09 MED ORDER — INSULIN ASPART 100 UNIT/ML ~~LOC~~ SOLN
0.0000 [IU] | Freq: Three times a day (TID) | SUBCUTANEOUS | Status: DC
  Administered 2016-05-10: 3 [IU] via SUBCUTANEOUS
  Administered 2016-05-10 (×2): 5 [IU] via SUBCUTANEOUS
  Administered 2016-05-11: 3 [IU] via SUBCUTANEOUS
  Administered 2016-05-11 (×2): 5 [IU] via SUBCUTANEOUS

## 2016-05-09 MED ORDER — INSULIN GLARGINE 100 UNIT/ML ~~LOC~~ SOLN
60.0000 [IU] | Freq: Every day | SUBCUTANEOUS | Status: DC
  Administered 2016-05-09 – 2016-05-10 (×2): 60 [IU] via SUBCUTANEOUS
  Filled 2016-05-09 (×4): qty 0.6

## 2016-05-09 MED ORDER — INSULIN ASPART 100 UNIT/ML ~~LOC~~ SOLN
10.0000 [IU] | Freq: Three times a day (TID) | SUBCUTANEOUS | Status: DC
  Administered 2016-05-10 – 2016-05-11 (×5): 10 [IU] via SUBCUTANEOUS

## 2016-05-09 MED ORDER — SACCHAROMYCES BOULARDII 250 MG PO CAPS
250.0000 mg | ORAL_CAPSULE | Freq: Two times a day (BID) | ORAL | Status: DC
  Administered 2016-05-09 – 2016-05-11 (×4): 250 mg via ORAL
  Filled 2016-05-09 (×4): qty 1

## 2016-05-09 NOTE — [place] FL2 (Signed)
[place] MEDICAID FL2 LEVEL OF CARE SCREENING TOOL     IDENTIFICATION  Patient Name: [name] Birthdate: [birth year] Sex: male Admission Date (Current Location): 05/04/2016  [facility] and [place] Number:  Herbalist and Address:  [facility] [place], [employer] [address] [place], [place] [address]      Provider Number: [id number]  Attending Physician Name and Address:  Orson Eva, MD  Relative Name and Phone Number:  Casella,[name] Spouse [phone], (w) [phone], (mobile) [phone]     Current Level of Care: SNF Recommended Level of Care: [place] Prior Approval Number:    Date Approved/Denied:   PASRR Number: [id number] (Eff. 01/17/15)  Discharge Plan: SNF    Current Diagnoses: Patient Active Problem List   Diagnosis Date Noted  . Pressure ulcer 05/08/2016  . Diabetic foot infection ([place]) 05/04/2016  . Diabetic foot ([place]) 05/04/2016  . Osteomyelitis of ankle or foot, acute ([place]) 04/25/2016  . Type 2 diabetes mellitus with insulin therapy ([place]) 12/16/2015  . Amputated toe ([place]) 10/21/2015  . Diabetic foot ulcer ([place]) 10/12/2015  . Cellulitis of left foot 09/03/2015  . Leg edema, left 09/03/2015  . Acute renal failure ([place]) 09/03/2015  . CAD (dz of distal, mid and proximal RCA with implantation of 3 overlapping drug-eluting stent,) 09/03/2015  . Chronic diastolic heart failure, NYHA class 2 ([place]) 09/03/2015  . Opacity of lung on imaging study 01/14/2015  . Angina pectoris associated with type 2 diabetes mellitus ([place]) 10/28/2014  . Hyperlipidemia 08/25/2014  . Chronic total occlusion of artery of the extremities ([place]) 04/08/2012  . Onychomycosis 02/01/2012  . Diabetes mellitus, insulin dependent (IDDM), controlled ([place]) 11/01/2011  . Occlusion and stenosis of carotid artery without mention of cerebral infarction 06/12/2011  . Aftercare following surgery of the circulatory system, [place] 06/12/2011  . GERD  (gastroesophageal reflux disease) 05/08/2011  . Barrett's esophagus without dysplasia 05/08/2011  . PVD (peripheral vascular disease) ([place]) 02/01/2011  . HTN (hypertension) 02/01/2011  . Former tobacco use 02/01/2011    Orientation RESPIRATION BLADDER Height & Weight     Self, Time, Situation, Place  Normal Continent Weight: 244 lb 14.9 oz (111.1 kg) Height:     BEHAVIORAL SYMPTOMS/MOOD NEUROLOGICAL BOWEL NUTRITION STATUS      Continent Diet (Carb modified)  AMBULATORY STATUS COMMUNICATION OF NEEDS Skin   Extensive Assist Verbally Other (Comment) (Stage 2 PU to left medial buttocks. Left Diabetic foot infection/Osteomyelitis-strict non-weightbearing. Curlex and xeroform on foot.)                       Personal Care Assistance Level of Assistance  Bathing, Feeding, Dressing Bathing Assistance: Limited assistance Feeding assistance: Independent Dressing Assistance: Limited assistance     Functional Limitations Info  Sight, Hearing, Speech Sight Info: Impaired (Wears glasses) Hearing Info: Impaired Speech Info: Adequate    SPECIAL CARE FACTORS FREQUENCY  PT (By licensed PT)     PT Frequency: Evaluated 8/29 and a minimum of 3X per week therapy recommended.              Contractures Contractures Info: Not present    Additional Factors Info  Code Status, Allergies, Insulin Sliding Scale Code Status Info: Full Allergies Info: Ivp Dye and Adhesive Tape   Insulin Sliding Scale Info: 0-9 Units insulin-3 times daily with meals.  0-5 Units insulin at bedtime daily       Current Medications (05/09/2016):  This is the current hospital active medication list Current  Facility-Administered Medications  Medication Dose Route Frequency Provider Last Rate Last Dose  . 0.9 %  sodium chloride infusion  250 mL Intravenous PRN Maryann Mikhail, DO      . acetaminophen (TYLENOL) tablet 650 mg  650 mg Oral Q6H PRN Maryann Mikhail, DO       Or  . acetaminophen (TYLENOL) suppository  650 mg  650 mg Rectal Q6H PRN Maryann Mikhail, DO      . ALPRAZolam Duanne Moron) tablet 0.25 mg  0.25 mg Oral Daily Maryann Mikhail, DO   0.25 mg at 05/09/16 0849  . amLODipine (NORVASC) tablet 5 mg  5 mg Oral QHS Maryann Mikhail, DO   5 mg at 05/08/16 2123   And  . irbesartan (AVAPRO) tablet 150 mg  150 mg Oral QHS Maryann Mikhail, DO   150 mg at 05/08/16 2125  . aspirin EC tablet 81 mg  81 mg Oral Daily Maryann Mikhail, DO   81 mg at 05/09/16 0843  . atorvastatin (LIPITOR) tablet 40 mg  40 mg Oral Daily Maryann Mikhail, DO   40 mg at 05/09/16 0849  . carvedilol (COREG) tablet 25 mg  25 mg Oral Daily Maryann Mikhail, DO   25 mg at 05/09/16 0849  . carvedilol (COREG) tablet 6.25 mg  6.25 mg Oral QHS Maryann Mikhail, DO   6.25 mg at 05/08/16 2122  . ceFEPIme (MAXIPIME) 2 g in dextrose 5 % 50 mL IVPB  2 g Intravenous Q12H Lonia Farber Comanici, Student-PharmD 100 mL/hr at 05/09/16 0557 2 g at 05/09/16 0557  . clobetasol cream (TEMOVATE) [id number] % 1 application  1 application Topical Daily PRN Maryann Mikhail, DO      . clopidogrel (PLAVIX) tablet 75 mg  75 mg Oral Daily Maryann Mikhail, DO   75 mg at 05/09/16 0849  . collagenase (SANTYL) ointment   Topical Daily Maryann Mikhail, DO      . enoxaparin (LOVENOX) injection 40 mg  40 mg Subcutaneous Q24H Maryann Mikhail, DO   40 mg at 05/08/16 2126  . ezetimibe (ZETIA) tablet 10 mg  10 mg Oral Daily Maryann Mikhail, DO   10 mg at 05/09/16 0849  . fentaNYL (DURAGESIC - dosed mcg/hr) 50 mcg  50 mcg Transdermal Q72H Maryann Mikhail, DO   50 mcg at 05/08/16 2339  . fluticasone (CUTIVATE) 0.005 % ointment 1 application  1 application Topical BID PRN Maryann Mikhail, DO      . HYDROmorphone (DILAUDID) tablet 2 mg  2 mg Oral QHS Maryann Mikhail, DO   2 mg at 05/08/16 2122  . insulin aspart (novoLOG) injection 0-5 Units  0-5 Units Subcutaneous QHS Maryann Mikhail, DO   3 Units at 05/08/16 2140  . insulin aspart (novoLOG) injection 0-9 Units  0-9 Units Subcutaneous TID WC  Maryann Mikhail, DO   2 Units at 05/09/16 [phone]  . insulin aspart (novoLOG) injection 8 Units  8 Units Subcutaneous TID WC Maryann Mikhail, DO   8 Units at 05/09/16 0844  . insulin glargine (LANTUS) injection 54 Units  54 Units Subcutaneous QHS Maryann Mikhail, DO   54 Units at 05/08/16 2123  . isosorbide mononitrate (IMDUR) 24 hr tablet 120 mg  120 mg Oral Daily Maryann Mikhail, DO   120 mg at 05/09/16 0843  . linaclotide (LINZESS) capsule 145 mcg  145 mcg Oral Daily PRN Maryann Mikhail, DO      . meloxicam (MOBIC) tablet 15 mg  15 mg Oral QHS Maryann Mikhail, DO   15 mg at 05/08/16 2124  .  metroNIDAZOLE (FLAGYL) tablet 500 mg  500 mg Oral Q8H Maryann Mikhail, DO   500 mg at 05/09/16 0557  . multivitamin with minerals tablet 1 tablet  1 tablet Oral Daily Maryann Mikhail, DO   1 tablet at 05/09/16 0848  . naproxen sodium (ANAPROX) tablet 275 mg  275 mg Oral BID WC Maryann Mikhail, DO   275 mg at 05/09/16 [id number]  . nitroGLYCERIN (NITROGLYN) 2 % ointment 0.75 inch  0.75 inch Topical Daily PRN Maryann Mikhail, DO      . nitroGLYCERIN (NITROSTAT) SL tablet 0.4 mg  0.4 mg Sublingual Q5 min PRN Maryann Mikhail, DO      . omega-3 acid ethyl esters (LOVAZA) capsule 2 g  2 g Oral BID Maryann Mikhail, DO   2 g at 05/09/16 0848  . ondansetron (ZOFRAN-ODT) disintegrating tablet 4 mg  4 mg Oral QID PRN Maryann Mikhail, DO      . oxyCODONE-acetaminophen (PERCOCET/ROXICET) 5-325 MG per tablet 1 tablet  1 tablet Oral Q8H PRN Cristal Ford, DO   1 tablet at 05/09/16 0557  . pantoprazole (PROTONIX) EC tablet 40 mg  40 mg Oral QAC breakfast Maryann Mikhail, DO   40 mg at 05/09/16 0844  . pregabalin (LYRICA) capsule 100 mg  100 mg Oral TID Montine Circle, PA-C   100 mg at 05/09/16 0849  . rOPINIRole (REQUIP) tablet 2 mg  2 mg Oral QHS Maryann Mikhail, DO   2 mg at 05/08/16 2122  . sodium chloride flush (NS) 0.9 % injection 3 mL  3 mL Intravenous Q12H Maryann Mikhail, DO   3 mL at 05/09/16 1000  . sodium chloride flush  (NS) 0.9 % injection 3 mL  3 mL Intravenous PRN Maryann Mikhail, DO      . tamsulosin (FLOMAX) capsule 0.4 mg  0.4 mg Oral Daily Maryann Mikhail, DO   0.4 mg at 05/09/16 0848  . torsemide (DEMADEX) tablet 40 mg  40 mg Oral Daily Maryann Mikhail, DO   40 mg at 05/09/16 0848  . vancomycin (VANCOCIN) IVPB 750 mg/150 ml premix  750 mg Intravenous Q12H Kathryn H Comanici, Student-PharmD   750 mg at 05/09/16 0845     Discharge Medications: Please see discharge summary for a list of discharge medications.  Relevant Imaging Results:  Relevant Lab Results:   Additional Information [phone].  Sable Feil, LCSW

## 2016-05-09 NOTE — Progress Notes (Signed)
Inpatient Diabetes Program Recommendations  AACE/ADA: New Consensus Statement on Inpatient Glycemic Control (2015)  Target Ranges:  Prepandial:   less than 140 mg/dL      Peak postprandial:   less than 180 mg/dL (1-2 hours)      Critically ill patients:  140 - 180 mg/dL   Results for [name], KOPPLIN (MRN [id number]) as of 05/09/2016 11:01  Ref. Range 05/08/2016 07:50 05/08/2016 12:16 05/08/2016 16:55 05/08/2016 20:12 05/09/2016 08:17  Glucose-Capillary Latest Ref Range: 65 - 99 mg/dL 185 (H) 277 (H) 288 (H) 290 (H) 192 (H)   Review of Glycemic Control   Current orders for Inpatient glycemic control: Lantus 54 units QHS, Novolog 8 units TID with meals, Novolog 0-9 units TID with meals, Novolog 0-5 units QHS  Inpatient Diabetes Program Recommendations: Insulin - Basal: Please consider increasing Lantus to 56 units QHS. Insulin - Meal Coverage: Please consider increasing meal coverage to Novolog 12 units TID with meals.  Thanks, Barnie Alderman, RN, MSN, CDE Diabetes Coordinator Inpatient Diabetes Program [phone] (Team Pager from [place] to [place]) [phone] (AP office) [phone] [facility] office) [phone] [facility] office)

## 2016-05-09 NOTE — Progress Notes (Addendum)
         PROGRESS NOTE  [name] MRN:[id number] DOB: 10/24/1955 DOA: 05/04/2016 PCP: KOHUT,WALTER DENNIS, MD  Brief History:  59 y.o.malewith a medical history of diastolic heart failure, diabetes, coronary artery disease, who presented to the emergency department with complaints of fever, nausea and vomiting. Patient states his symptoms started approximately 3 days ago. He was recently admitted and discharged on 8/17/2017for osteomyelitis ulceration of the fifth metatarsal and left cuboid. His antibiotics were recently discontinued on 04/30/2016. He has not noticed any discharge from his foot although he does have wound care nursing at home. He began to have diarrhea approximately one day ago with nausea and vomiting however those things of subsided. He has noticed a low-grade fever at home along with chills. He also endorses malodorous urine with some pain. Currently he denies any chest pain, shortness of breath, abdominal pain, further diarrhea or constipation, dizziness, headache, recent travel or ill contacts.   Assessment/Plan: Left Diabetic foot infection/Osteomyelitis -Patient presented to the emergency department with low-grade fever of 99.9F.  -Left foot x-ray shows prior amputations, loss of bone along the proximal fourth metatarsal as well as a lateral cuboid, concern for osteomyelitis. No abscess. No acute fracture or dislocation. -04/25/2016 excision of the base of the fifth metatarsal as well as partial excision of the cuboid--discharged on 04/26/2016 -He was on antibiotics as an outpatient for these were discontinued on 04/30/2016 -Orthopedics consulted and appreciated and obtained MRI of the foot: 3.2 x 3.6 x 6.1 cm fluid collection concerning for abscess extending to the distal aspect of the cuboid and base of the fourth metatarsal -Continue vancomycin, cefepime, Flagyl--D#6 -Blood cultures show no growth to date -05/04/16 ESR 98, CRP 8.0 (elevated) -Dr. [name] assessed  patient and felt that patient's foot had no swelling, cellulitis, drainage, odor.  -Strict nonweightbearing LLE.  -PT eval-->SNF -recheck ESR and CRP in am  Chronic diastolic CHF -Echocardiogram 09/27/2014 showed an EF of 55-60%, grade 2 diastolic dysfunction -Monitor intake and output, daily weights -Continue torsemide, Coreg, olmesartan  CKD stage 2 -baseline creatinine 1.2-1.4  Essential hypertension -Stable, continue Azor, Coreg, Imdur  Coronary artery disease -Stable, currently no complaints of chest pain. -Continue Coreg, aspirin, Plavix, Imdur, Lipitor, zetia, statin  Diabetes mellitus, type II with peripheral neuropathy -Hold Glucovance -Continue Lantus, and novology -Continue  insulin slidingscale CBG monitoring -hemoglobin A1c on 04/25/2016 was 9.2, prior to that 6.8 -Continue Lyrica -increased lantus to 60u, increase premeal novolog to 10 tiw  Chronic anemia -Baseline hemoglobin 9-10 -Continue to monitor CBC  Chronic pain -Continue home regimen: Fentanyl patch, naproxen, mobic, Dilaudid, oxycodone  Diarrhea -Resolved -Patient endorsed a few episodes of diarrhea on 05/03/2016, prior to admission. Feels this is due to drinking cold apple juice.    Dysuria -UA unremarkable for infection, however patient was previously on antibiotics until 04/30/2016 -Patient placed on antibiotics for possible foot osteo  GERD -Continue PPI   Disposition Plan:  SNF on 05/11/16 if stable Family Communication:   No Family at bedside--Total time spent 35 minutes.  Greater than 50% spent face to face counseling and coordinating care.   Consultants:  Ortho--Dr. [name]  Code Status:  FULL DVT Prophylaxis:  [place] Lovenox   Procedures: As Listed in Progress Note Above  Antibiotics: Vanco, cefepime, flagyl 05/04/16>>>    Subjective: Patient complains of pain in his left foot but it is better controlled. Denies any fevers, chills, chest pain or shortness breath,  vomiting, diarrhea, abdominal pain. No dysuria or   hematuria. No hematochezia or melena.  Objective: Vitals:   05/08/16 2008 05/08/16 2034 05/09/16 0446 05/09/16 1033  BP: 106/74  (!) 144/59 (!) 151/71  Pulse: 72  63 68  Resp: 18  18 16  Temp: 98.1 F (36.7 C)  98.1 F (36.7 C) 98.8 F (37.1 C)  TempSrc: Oral  Oral Oral  SpO2: 98% 98% 99% 97%  Weight: 111.1 kg (244 lb 14.9 oz)       Intake/Output Summary (Last 24 hours) at 05/09/16 1717 Last data filed at 05/09/16 1531  Gross per 24 hour  Intake             1730 ml  Output             4175 ml  Net            -2445 ml   Weight change: 1.556 kg (3 lb 6.9 oz) Exam:   General:  Pt is alert, follows commands appropriately, not in acute distress  HEENT: No icterus, No thrush, No neck mass, [place]/AT  Cardiovascular: RRR, S1/S2, no rubs, no gallops  Respiratory: CTA bilaterally, no wheezing, no crackles, no rhonchi  Abdomen: Soft/+BS, non tender, non distended, no guarding  Extremities: 1+ LLE edema, No lymphangitis, No petechiae, No rashes, no synovitis;  no lymphangitis, necrosis, drainage.   Data Reviewed: I have personally reviewed following labs and imaging studies Basic Metabolic Panel:  Recent Labs Lab 05/05/16 0632 05/06/16 0424 05/07/16 0520 05/08/16 0835 05/09/16 0451  NA 135 133* 131* 134* 134*  K 4.0 4.3 3.8 4.1 4.2  CL 100* 99* 98* 99* 96*  CO2 24 27 23 27 28  GLUCOSE 270* 278* 194* 185* 241*  BUN 18 22* 21* 26* 27*  CREATININE 1.43* 1.40* 1.33* 1.42* 1.41*  CALCIUM 8.4* 8.4* 8.5* 8.6* 8.5*   Liver Function Tests:  Recent Labs Lab 05/04/16 1250  AST 16  ALT 12*  ALKPHOS 107  BILITOT 0.6  PROT 6.2*  ALBUMIN 2.7*   No results for input(s): LIPASE, AMYLASE in the last 168 hours. No results for input(s): AMMONIA in the last 168 hours. Coagulation Profile: No results for input(s): INR, PROTIME in the last 168 hours. CBC:  Recent Labs Lab 05/04/16 1250 05/04/16 1934 05/05/16 0632  05/06/16 0424 05/07/16 0520 05/08/16 0835 05/09/16 0451  WBC 9.6 8.9 7.8 9.0 11.0* 10.7* 7.9  NEUTROABS 7.6 6.8  --   --   --   --   --   HGB 10.5* 10.3* 9.4* 9.1* 9.6* 8.7* 8.7*  HCT 30.8* 30.7* 29.1* 27.9* 28.8* 25.7* 26.0*  MCV 80.8 81.4 83.4 82.5 82.1 81.3 81.8  PLT 310 320 274 281 313 342 307   Cardiac Enzymes: No results for input(s): CKTOTAL, CKMB, CKMBINDEX, TROPONINI in the last 168 hours. BNP: Invalid input(s): POCBNP CBG:  Recent Labs Lab 05/08/16 2012 05/09/16 0817 05/09/16 1156 05/09/16 1618 05/09/16 1633  GLUCAP 290* 192* 213* 380* 359*   HbA1C: No results for input(s): HGBA1C in the last 72 hours. Urine analysis:    Component Value Date/Time   COLORURINE YELLOW 05/04/2016 1610   APPEARANCEUR CLEAR 05/04/2016 1610   LABSPEC 1.014 05/04/2016 1610   PHURINE 5.5 05/04/2016 1610   GLUCOSEU 100 (A) 05/04/2016 1610   HGBUR NEGATIVE 05/04/2016 1610   BILIRUBINUR NEGATIVE 05/04/2016 1610   KETONESUR NEGATIVE 05/04/2016 1610   PROTEINUR 100 (A) 05/04/2016 1610   UROBILINOGEN 0.2 01/14/2015 0346   NITRITE NEGATIVE 05/04/2016 1610   LEUKOCYTESUR NEGATIVE 05/04/2016 1610     Sepsis Labs: _0 (procalcitonin:4,lacticidven:4) ) Recent Results (from the past 240 hour(s))  Blood Cultures x 2 sites     Status: None   Collection Time: 05/04/16  7:15 PM  Result Value Ref Range Status   Specimen Description BLOOD RIGHT ANTECUBITAL  Final   Special Requests BOTTLES DRAWN AEROBIC AND ANAEROBIC 10CC  Final   Culture NO GROWTH 5 DAYS  Final   Report Status 05/09/2016 FINAL  Final  Blood Cultures x 2 sites     Status: None   Collection Time: 05/04/16  7:30 PM  Result Value Ref Range Status   Specimen Description BLOOD BLOOD RIGHT FOREARM  Final   Special Requests BOTTLES DRAWN AEROBIC ONLY 8CC  Final   Culture NO GROWTH 5 DAYS  Final   Report Status 05/09/2016 FINAL  Final     Scheduled Meds: . ALPRAZolam  0.25 mg Oral Daily  . amLODipine  5 mg Oral QHS    And  . irbesartan  150 mg Oral QHS  . aspirin EC  81 mg Oral Daily  . atorvastatin  40 mg Oral Daily  . carvedilol  25 mg Oral Daily  . carvedilol  6.25 mg Oral QHS  . ceFEPime (MAXIPIME) IV  2 g Intravenous Q12H  . clopidogrel  75 mg Oral Daily  . collagenase   Topical Daily  . enoxaparin (LOVENOX) injection  40 mg Subcutaneous Q24H  . ezetimibe  10 mg Oral Daily  . fentaNYL  50 mcg Transdermal Q72H  . HYDROmorphone  2 mg Oral QHS  . insulin aspart  0-5 Units Subcutaneous QHS  . insulin aspart  0-9 Units Subcutaneous TID WC  . insulin aspart  8 Units Subcutaneous TID WC  . insulin glargine  54 Units Subcutaneous QHS  . isosorbide mononitrate  120 mg Oral Daily  . meloxicam  15 mg Oral QHS  . metroNIDAZOLE  500 mg Oral Q8H  . multivitamin with minerals  1 tablet Oral Daily  . naproxen sodium  275 mg Oral BID WC  . omega-3 acid ethyl esters  2 g Oral BID  . pantoprazole  40 mg Oral QAC breakfast  . pregabalin  100 mg Oral TID  . rOPINIRole  2 mg Oral QHS  . saccharomyces boulardii  250 mg Oral BID  . sodium chloride flush  3 mL Intravenous Q12H  . tamsulosin  0.4 mg Oral Daily  . torsemide  40 mg Oral Daily  . vancomycin  750 mg Intravenous Q12H   Continuous Infusions:   Procedures/Studies: Dg Chest 2 View  Result Date: 05/08/2016 CLINICAL DATA:  Wheezing. EXAM: CHEST  2 VIEW COMPARISON:  05/04/2016. FINDINGS: The lungs are clear wiithout focal pneumonia, edema, pneumothorax or pleural effusion. The cardiopericardial silhouette is within normal limits for size. The visualized bony structures of the thorax are intact. IMPRESSION: No active cardiopulmonary disease. Electronically Signed   By: Misty Stanley M.D.   On: 05/08/2016 11:57   Dg Chest 2 View  Result Date: 05/04/2016 CLINICAL DATA:  Chest pain EXAM: CHEST  2 VIEW COMPARISON:  12/16/2015 FINDINGS: The heart size appears normal. There is no pleural effusion or edema. No airspace consolidation identified. The lung volumes  appear low. There is mild spondylosis within the thoracic spine. IMPRESSION: 1. No acute cardiopulmonary abnormalities. Electronically Signed   By: Kerby Moors M.D.   On: 05/04/2016 13:36   Mr Foot Left W Wo Contrast  Result Date: 05/05/2016 CLINICAL DATA:  Infected left lateral foot. Status post surgery 04/25/2016.  EXAM: MRI OF THE LEFT FOREFOOT WITHOUT AND WITH CONTRAST TECHNIQUE: Multiplanar, multisequence MR imaging was performed both before and after administration of intravenous contrast. CONTRAST:  20mL MULTIHANCE GADOBENATE DIMEGLUMINE 529 MG/ML IV SOLN COMPARISON:  None. FINDINGS: Bones/Joint/Cartilage Interval resection of the remainder the fifth metatarsal and partial distal cuboid resection. Prior resection of the fourth phalanges. Marrow edema at the base of the fourth metatarsal with mild cortical regularity along the lateral aspect. No acute fracture or dislocation. Normal alignment. No joint effusion. 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 Mild osteoarthritis of the first MTP joint. Ligaments Collateral ligaments are intact. Muscles and Tendons Flexor, extensor and peroneal tendons are intact. Generalized muscle edema likely reactive. Soft tissue Severe soft tissue edema and enhancement along the lateral aspect of the foot. Large fluid collection measuring 3.2 x 3.6 x 6.1 cm with peripheral enhancement concerning for an abscess extending to the distal aspect of the cuboid and base of the fourth metatarsal. No soft tissue mass. IMPRESSION: 1. Severe soft tissue edema and enhancement along the lateral aspect of the foot. Large fluid collection measuring 3.2 x 3.6 x 6.1 cm with peripheral enhancement concerning for an abscess extending to the distal aspect of the cuboid and base of the fourth metatarsal. Cortical irregularity and edema within the distal aspect of the cuboid and lateral aspect of the base of the fourth metatarsal which may reflect postsurgical changes  versus osteomyelitis. 2. 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 Electronically Signed   By: Hetal  Patel   On: 05/05/2016 15:06   Dg Foot Complete Left  Result Date: 05/04/2016 CLINICAL DATA:  Diabetic with soft tissue wound EXAM: LEFT FOOT - COMPLETE 3+ VIEW COMPARISON:  December 16, 2015 FINDINGS: Frontal, oblique, and lateral views were obtained. There has been interval removal of the remainder of the fifth metatarsal. Currently there is been amputation of the right fifth metatarsal and fifth phalanges as well as the fourth proximal, middle, distal phalanges. On the current examination, there is soft tissue prominence laterally without radiopaque foreign body. There is subtle loss of bony definition along the lateral aspect of the cuboid bone as well as along the lateral proximal aspect of the fourth metatarsal. These findings are concerning for potential osteomyelitis. There is no acute fracture or dislocation. There is moderate narrowing of all remaining PIP and DIP joints. No erosive change. There is spurring in the dorsal midfoot. There is a spur arising from the inferior calcaneus. There is atherosclerotic calcification anterior to the ankle joint. Note that there is generalized soft tissue edema, particularly in the dorsum of the foot. IMPRESSION: Prior amputations as noted above. Loss of bony definition along the lateral aspect of the proximal fourth metatarsal as well as along the lateral cuboid. These findings raise concern for osteomyelitis in these areas. There is soft tissue prominence lateral to these areas without well-defined soft tissue abscess. There is generalized soft tissue edema most notably dorsally. No acute fracture or dislocation. Joint space narrowing at several sites. MR or three-phase nuclear medicine bone scan could be helpful for further assessment with respect to potential osteomyelitis. Electronically Signed   By: William  Woodruff III M.D.   On:  05/04/2016 14:44    TAT, DAVID, DO  [facility] Pager [phone]  If 7PM-7AM, please contact night-coverage [email or web address] Password [email or web address] 05/09/2016, 5:17 PM   LOS: 4 days   

## 2016-05-10 DIAGNOSIS — I10 Essential (primary) hypertension: Secondary | ICD-10-CM

## 2016-05-10 LAB — GLUCOSE, CAPILLARY
Glucose-Capillary: 194 mg/dL — ABNORMAL HIGH (ref 65–99)
Glucose-Capillary: 207 mg/dL — ABNORMAL HIGH (ref 65–99)
Glucose-Capillary: 250 mg/dL — ABNORMAL HIGH (ref 65–99)
Glucose-Capillary: 321 mg/dL — ABNORMAL HIGH (ref 65–99)

## 2016-05-10 MED ORDER — PREGABALIN 100 MG PO CAPS: 100.0000 mg | ORAL_CAPSULE | Freq: Three times a day (TID) | ORAL | 0 refills | Status: DC

## 2016-05-10 MED ORDER — FENTANYL 50 MCG/HR TD PT72: 50.0000 ug | MEDICATED_PATCH | TRANSDERMAL | 0 refills | Status: DC

## 2016-05-10 MED ORDER — HYDROMORPHONE HCL 2 MG PO TABS: 2.0000 mg | ORAL_TABLET | Freq: Every day | ORAL | 0 refills | Status: DC

## 2016-05-10 MED ORDER — OXYCODONE-ACETAMINOPHEN 5-325 MG PO TABS: 1.0000 | ORAL_TABLET | Freq: Three times a day (TID) | ORAL | 0 refills | Status: DC | PRN

## 2016-05-10 MED ORDER — SACCHAROMYCES BOULARDII 250 MG PO CAPS: 250.0000 mg | ORAL_CAPSULE | Freq: Two times a day (BID) | ORAL | 0 refills | Status: DC

## 2016-05-10 NOTE — Care Management Note (Signed)
Case Management Note  Patient Details  Name: [name] MRN: [id number] Date of Birth: 1956/05/30  Subjective/Objective:      CM following for progression and d/c planning.               Action/Plan: 05/10/2016 Met with pt and CSW, VSharlet Salina on 05/09/2016 pt hopeing to d/c to SNF @ [place].  No HH or DME needs at this time.   Expected Discharge Date:    05/11/2016              Expected Discharge Plan:  [place]  In-House Referral:  NA  Discharge planning Services  NA  Post Acute Care Choice:  NA Choice offered to:  NA  DME Arranged:  N/A DME Agency:  NA  HH Arranged:  NA HH Agency:  NA  Status of Service:  Completed, signed off  If discussed at [place] of Stay Meetings, dates discussed:    Additional Comments:  Adron Bene, RN 05/10/2016, 12:19 PM

## 2016-05-10 NOTE — Progress Notes (Signed)
PROGRESS NOTE  [name] JOI:[id number] DOB: June 24, 1956 DOA: 05/04/2016 PCP: [name], MD  Brief History:  60 y.o.malewith a medical history of diastolic heart failure, diabetes, coronary artery disease, who presented to the emergency department with complaints of fever, nausea and vomiting. Patient states his symptoms started approximately 3 days ago. He was recently admitted and discharged on 8/17/2017for osteomyelitis ulceration of the fifth metatarsal and left cuboid. His antibiotics were recently discontinued on 04/30/2016. He had not noticed any discharge from his foot although he does have wound care nursing at home. He began to have diarrhea approximately one day ago with nausea and vomiting however those things of subsided. He has noticed a low-grade fever at home along with chills. He stated his temperature was less than 100.2F. He also endorses malodorous urine with some dysuria. The patient was admitted and started on intravenous antibiotics.  Assessment/Plan: Left Diabetic foot infection/Osteomyelitis -Patient presented to the emergency department with low-grade fever of 99.57F.  -Left foot x-ray shows prior amputations, loss of bone along the proximal fourth metatarsal as well as a lateral cuboid, concern for osteomyelitis. No abscess. No acute fracture or dislocation. -04/25/2016 excision of the base of the fifth metatarsal as well as partial excision of the cuboid--discharged on 04/26/2016 -He was on antibiotics as an outpatient-- these were discontinued on 04/30/2016 -Orthopedics consulted and appreciated and obtained MRI of the foot: 3.2 x 3.6 x 6.1 cm fluid collection concerning for abscess extending to the distal aspect of the cuboid and base of the fourth metatarsal -Continue vancomycin, cefepime, Flagyl--D#7 -plan to d/c with doxy and amox/clavulanate -Blood cultures show no growth to date -05/04/16 ESR 98, CRP 8.0 (elevated) -[name] assessed  patient and felt that patient's foot had no swelling, cellulitis, drainage, odor.  -Strict nonweightbearing LLE.  -PT eval-->SNF -recheck ESR and CRP in am  Chronic diastolic CHF -Echocardiogram 09/27/2014 showed an EF of 15-83%, grade 2 diastolic dysfunction -Monitor intake and output, daily weights -Continue torsemide, Coreg, olmesartan  CKD stage 2 -baseline creatinine 1.2-1.4  Essential hypertension -Stable, continue Azor, Coreg, Imdur  Coronary artery disease -Stable, currently no complaints of chest pain. -Continue Coreg, aspirin, Plavix, Imdur, Lipitor, zetia, statin  Diabetes mellitus, type II with peripheral neuropathy -Hold Glucovance -Continue Lantus, and novolog -Continue insulin slidingscale CBG monitoring -hemoglobin A1c on 04/25/2016 was 9.2, prior to that 6.8 -Continue Lyrica -increasedlantus to 60u, increase premeal novolog to 10 tiw -05/10/2016--will not adjust insulin regimen at this time as the patient has not been receiving his 10 units pre-meal in addition to his usual sliding scale  Chronic anemia -Baseline hemoglobin 9-10 -Continue to monitor CBC  Chronic pain -Continue home regimen: Fentanyl patch, naproxen, mobic, Dilaudid, oxycodone  Diarrhea -Resolved -Patient endorsed a few episodes of diarrhea on 05/03/2016, prior to admission. Feels this is due to drinking cold apple juice.   Dysuria -UA unremarkable for infection, however patient was previously on antibiotics until 04/30/2016 -Patient placed on antibiotics for possible foot osteo  GERD -Continue PPI   Disposition Plan:  SNF on 05/11/16 if stable Family Communication:   No Family at bedside--Total time spent 35 minutes.  Greater than 50% spent face to face counseling and coordinating care.   Consultants:  Ortho--[name]  Code Status:  FULL DVT Prophylaxis:  [place] Lovenox   Procedures: As Listed in Progress Note Above  Antibiotics: Vanco, cefepime, flagyl  05/04/16>>>05/11/16      Subjective: Patient states that the  pain in his left foot is controlled. Denies any fevers, chills, chest pain, shortness breath, nausea, vomiting, diarrhea. No abdominal pain or dysuria.  Objective: Vitals:   05/09/16 1718 05/09/16 2057 05/10/16 0508 05/10/16 0954  BP: (!) 146/68 (!) 157/67 139/67 (!) 149/58  Pulse: 78 84 (!) 59 (!) 59  Resp: 18 20 16 18   Temp: 98.5 F (36.9 C) 99.5 F (37.5 C) 98.1 F (36.7 C) 98.3 F (36.8 C)  TempSrc: Oral Oral Oral Oral  SpO2: 98% 97% 99% 99%  Weight:  109.3 kg (241 lb)      Intake/Output Summary (Last 24 hours) at 05/10/16 1508 Last data filed at 05/10/16 0900  Gross per 24 hour  Intake             1400 ml  Output             2400 ml  Net            -1000 ml   Weight change: -1.783 kg (-3 lb 14.9 oz) Exam:   General:  Pt is alert, follows commands appropriately, not in acute distress  HEENT: No icterus, No thrush, No neck mass, [place]/AT  Cardiovascular: RRR, S1/S2, no rubs, no gallops  Respiratory: CTA bilaterally, no wheezing, no crackles, no rhonchi  Abdomen: Soft/+BS, non tender, non distended, no guarding Extremities: Left foot without any erythema, purulent drainage, necrosis, crepitance. 1+ edema left lower extremity.  Data Reviewed: I have personally reviewed following labs and imaging studies Basic Metabolic Panel:  Recent Labs Lab 05/05/16 0632 05/06/16 0424 05/07/16 0520 05/08/16 0835 05/09/16 0451  NA 135 133* 131* 134* 134*  K 4.0 4.3 3.8 4.1 4.2  CL 100* 99* 98* 99* 96*  CO2 24 27 23 27 28   GLUCOSE 270* 278* 194* 185* 241*  BUN 18 22* 21* 26* 27*  CREATININE 1.43* 1.40* 1.33* 1.42* 1.41*  CALCIUM 8.4* 8.4* 8.5* 8.6* 8.5*   Liver Function Tests:  Recent Labs Lab 05/04/16 1250  AST 16  ALT 12*  ALKPHOS 107  BILITOT 0.6  PROT 6.2*  ALBUMIN 2.7*   No results for input(s): LIPASE, AMYLASE in the last 168 hours. No results for input(s): AMMONIA in the last 168  hours. Coagulation Profile: No results for input(s): INR, PROTIME in the last 168 hours. CBC:  Recent Labs Lab 05/04/16 1250 05/04/16 1934 05/05/16 5825 05/06/16 0424 05/07/16 0520 05/08/16 0835 05/09/16 0451  WBC 9.6 8.9 7.8 9.0 11.0* 10.7* 7.9  NEUTROABS 7.6 6.8  --   --   --   --   --   HGB 10.5* 10.3* 9.4* 9.1* 9.6* 8.7* 8.7*  HCT 30.8* 30.7* 29.1* 27.9* 28.8* 25.7* 26.0*  MCV 80.8 81.4 83.4 82.5 82.1 81.3 81.8  PLT 310 320 274 281 313 342 307   Cardiac Enzymes: No results for input(s): CKTOTAL, CKMB, CKMBINDEX, TROPONINI in the last 168 hours. BNP: Invalid input(s): POCBNP CBG:  Recent Labs Lab 05/09/16 1618 05/09/16 1633 05/09/16 2110 05/10/16 0814 05/10/16 1150  GLUCAP 380* 359* 390* 194* 207*   HbA1C: No results for input(s): HGBA1C in the last 72 hours. Urine analysis:    Component Value Date/Time   COLORURINE YELLOW 05/04/2016 [place] 05/04/2016 1610   LABSPEC 1.014 05/04/2016 1610   PHURINE 5.5 05/04/2016 1610   GLUCOSEU 100 (A) 05/04/2016 1610   HGBUR NEGATIVE 05/04/2016 1610   BILIRUBINUR NEGATIVE 05/04/2016 1610   KETONESUR NEGATIVE 05/04/2016 1610   PROTEINUR 100 (A) 05/04/2016 1610  UROBILINOGEN 0.2 01/14/2015 0346   NITRITE NEGATIVE 05/04/2016 1610   LEUKOCYTESUR NEGATIVE 05/04/2016 1610   Sepsis Labs: @LABRCNTIP (procalcitonin:4,lacticidven:4) ) Recent Results (from the past 240 hour(s))  Blood Cultures x 2 sites     Status: None   Collection Time: 05/04/16  7:15 PM  Result Value Ref Range Status   Specimen Description BLOOD RIGHT ANTECUBITAL  Final   Special Requests BOTTLES DRAWN AEROBIC AND ANAEROBIC 10CC  Final   Culture NO GROWTH 5 DAYS  Final   Report Status 05/09/2016 FINAL  Final  Blood Cultures x 2 sites     Status: None   Collection Time: 05/04/16  7:30 PM  Result Value Ref Range Status   Specimen Description BLOOD BLOOD RIGHT FOREARM  Final   Special Requests BOTTLES DRAWN AEROBIC ONLY 8CC  Final    Culture NO GROWTH 5 DAYS  Final   Report Status 05/09/2016 FINAL  Final     Scheduled Meds: . ALPRAZolam  0.25 mg Oral Daily  . amLODipine  5 mg Oral QHS   And  . irbesartan  150 mg Oral QHS  . aspirin EC  81 mg Oral Daily  . atorvastatin  40 mg Oral Daily  . carvedilol  25 mg Oral Daily  . carvedilol  6.25 mg Oral QHS  . ceFEPime (MAXIPIME) IV  2 g Intravenous Q12H  . clopidogrel  75 mg Oral Daily  . collagenase   Topical Daily  . enoxaparin (LOVENOX) injection  40 mg Subcutaneous Q24H  . ezetimibe  10 mg Oral Daily  . fentaNYL  50 mcg Transdermal Q72H  . HYDROmorphone  2 mg Oral QHS  . insulin aspart  0-15 Units Subcutaneous TID WC  . insulin aspart  0-5 Units Subcutaneous QHS  . insulin aspart  10 Units Subcutaneous TID WC  . insulin glargine  60 Units Subcutaneous QHS  . isosorbide mononitrate  120 mg Oral Daily  . meloxicam  15 mg Oral QHS  . metroNIDAZOLE  500 mg Oral Q8H  . multivitamin with minerals  1 tablet Oral Daily  . naproxen sodium  275 mg Oral BID WC  . omega-3 acid ethyl esters  2 g Oral BID  . pantoprazole  40 mg Oral QAC breakfast  . pregabalin  100 mg Oral TID  . rOPINIRole  2 mg Oral QHS  . saccharomyces boulardii  250 mg Oral BID  . sodium chloride flush  3 mL Intravenous Q12H  . tamsulosin  0.4 mg Oral Daily  . torsemide  40 mg Oral Daily  . vancomycin  750 mg Intravenous Q12H   Continuous Infusions:   Procedures/Studies: Dg Chest 2 View  Result Date: 05/08/2016 CLINICAL DATA:  Wheezing. EXAM: CHEST  2 VIEW COMPARISON:  05/04/2016. FINDINGS: The lungs are clear wiithout focal pneumonia, edema, pneumothorax or pleural effusion. The cardiopericardial silhouette is within normal limits for size. The visualized bony structures of the thorax are intact. IMPRESSION: No active cardiopulmonary disease. Electronically Signed   By: Misty Stanley M.D.   On: 05/08/2016 11:57   Dg Chest 2 View  Result Date: 05/04/2016 CLINICAL DATA:  Chest pain EXAM: CHEST  2  VIEW COMPARISON:  12/16/2015 FINDINGS: The heart size appears normal. There is no pleural effusion or edema. No airspace consolidation identified. The lung volumes appear low. There is mild spondylosis within the thoracic spine. IMPRESSION: 1. No acute cardiopulmonary abnormalities. Electronically Signed   By: Kerby Moors M.D.   On: 05/04/2016 13:36   Mr Foot Left  W Wo Contrast  Result Date: 05/05/2016 CLINICAL DATA:  Infected left lateral foot. Status post surgery 04/25/2016. EXAM: MRI OF THE LEFT FOREFOOT WITHOUT AND WITH CONTRAST TECHNIQUE: Multiplanar, multisequence MR imaging was performed both before and after administration of intravenous contrast. CONTRAST:  72m MULTIHANCE GADOBENATE DIMEGLUMINE 529 MG/ML IV SOLN COMPARISON:  None. FINDINGS: Bones/Joint/Cartilage Interval resection of the remainder the fifth metatarsal and partial distal cuboid resection. Prior resection of the fourth phalanges. Marrow edema at the base of the fourth metatarsal with mild cortical regularity along the lateral aspect. No acute fracture or dislocation. Normal alignment. No joint effusion. 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 Mild osteoarthritis of the first MTP joint. Ligaments Collateral ligaments are intact. Muscles and Tendons Flexor, extensor and peroneal tendons are intact. Generalized muscle edema likely reactive. Soft tissue Severe soft tissue edema and enhancement along the lateral aspect of the foot. Large fluid collection measuring 3.2 x 3.6 x 6.1 cm with peripheral enhancement concerning for an abscess extending to the distal aspect of the cuboid and base of the fourth metatarsal. No soft tissue mass. IMPRESSION: 1. Severe soft tissue edema and enhancement along the lateral aspect of the foot. Large fluid collection measuring 3.2 x 3.6 x 6.1 cm with peripheral enhancement concerning for an abscess extending to the distal aspect of the cuboid and base of the fourth metatarsal.  Cortical irregularity and edema within the distal aspect of the cuboid and lateral aspect of the base of the fourth metatarsal which may reflect postsurgical changes versus osteomyelitis. 2. 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 Electronically Signed   By: HKathreen Devoid  On: 05/05/2016 15:06   Dg Foot Complete Left  Result Date: 05/04/2016 CLINICAL DATA:  Diabetic with soft tissue wound EXAM: LEFT FOOT - COMPLETE 3+ VIEW COMPARISON:  December 16, 2015 FINDINGS: Frontal, oblique, and lateral views were obtained. There has been interval removal of the remainder of the fifth metatarsal. Currently there is been amputation of the right fifth metatarsal and fifth phalanges as well as the fourth proximal, middle, distal phalanges. On the current examination, there is soft tissue prominence laterally without radiopaque foreign body. There is subtle loss of bony definition along the lateral aspect of the cuboid bone as well as along the lateral proximal aspect of the fourth metatarsal. These findings are concerning for potential osteomyelitis. There is no acute fracture or dislocation. There is moderate narrowing of all remaining PIP and DIP joints. No erosive change. There is spurring in the dorsal midfoot. There is a spur arising from the inferior calcaneus. There is atherosclerotic calcification anterior to the ankle joint. Note that there is generalized soft tissue edema, particularly in the dorsum of the foot. IMPRESSION: Prior amputations as noted above. Loss of bony definition along the lateral aspect of the proximal fourth metatarsal as well as along the lateral cuboid. These findings raise concern for osteomyelitis in these areas. There is soft tissue prominence lateral to these areas without well-defined soft tissue abscess. There is generalized soft tissue edema most notably dorsally. No acute fracture or dislocation. Joint space narrowing at several sites. MR or three-phase nuclear  medicine bone scan could be helpful for further assessment with respect to potential osteomyelitis. Electronically Signed   By: WLowella GripIII M.D.   On: 05/04/2016 14:44    Keyvon Herter, DO  [facility] Pager [phone] If 7PM-7AM, please contact night-coverage [email or web address] Password [email or web address] 05/10/2016, 3:08 PM   LOS: 5 days

## 2016-05-10 NOTE — Clinical Social Work Note (Signed)
Clinical Social Work Assessment  Patient Details  Name: [name] MRN: [id number] Date of Birth: 1955/11/16  Date of referral:  05/09/16               Reason for consult:  Facility Placement                Permission sought to share information with:  Family Supports Permission granted to share information::  Yes, Verbal Permission Granted  Name::     Niclas Najjar  Agency::     Relationship::  Spouse  Contact Information:  [phone]  Housing/Transportation Living arrangements for the past 2 months:  [place] of Information:  Patient, Spouse Patient Interpreter Needed:  None Criminal Activity/Legal Involvement Pertinent to Current Situation/Hospitalization:  No - Comment as needed Significant Relationships:  Spouse, Other Family Members Lives with:  Spouse Do you feel safe going back to the place where you live?  Yes (Patient feels safe in his home, but understands that rehab needed before returning home) Need for family participation in patient care:  Yes (Comment)  Care giving concerns:  Patient and wife expressed need for rehab as wife works and cannot care for patient at home.    Social Worker assessment / plan: On 05/09/16 CSW talked with patient and wife at the bedside regarding discharge planning and recommendation of ST rehab. Patient is alert, oriented and along with wife very open to talking with CSW regarding discharge disposition. Mrs. Luecht informed CSW that contact had been made with Nira Conn at [facility] and she was awaiting patient's information so that she could begin authorization process. CSW also discussed with patient/wife the possibility of having to make other d/c plans or discharge to a facility that is not their preference if authorization not received when patient ready for discharge and patient/wife expressed understanding, although they were hopeful that patient would be able to discharge to the facility of their choice.    Employment  status:  Disabled (Comment on whether or not currently receiving Disability) Insurance information:  Medicare, Managed Care Nurse, mental health Other) PT Recommendations:  [place] / Referral to community resources:  Other (Comment Required) (Wife expressed facility preference - [facility] and had made contact with facility)  Patient/Family's Response to care: No concerns expressed regarding care during hospitalization.  Patient/Family's Understanding of and Emotional Response to Diagnosis, Current Treatment, and Prognosis: Not discussed.  Emotional Assessment Appearance:  Appears older than stated age Attitude/Demeanor/Rapport:  Other (Appropriate) Affect (typically observed):  Appropriate, Frustrated (Patient appeared frustrated during discussion regarding SNP placement, his insurance and how it could affect placement was discussed.) Orientation:  Oriented to Self, Oriented to Place, Oriented to  Time, Oriented to Situation Alcohol / Substance use:  Tobacco Use, Alcohol Use, Illicit Drugs (Patient reported that he quit smoking, drinks alcohol and does not use illicit drugs) Psych involvement (Current and /or in the community):  No (Comment)  Discharge Needs  Concerns to be addressed:  Financial / Insurance Concerns Readmission within the last 30 days:  No Current discharge risk:  None Barriers to Discharge:  [place], [place], [place] 05/10/2016, 10:10 PM

## 2016-05-10 NOTE — Discharge Summary (Signed)
Physician Discharge Summary  [name] [name] DOB: 1956/04/13 DOA: 05/04/2016  PCP: Harold Bolt, MD  Admit date: 05/04/2016 Discharge date: 05/11/2016  Admitted From: Home Disposition:  [facility] will not take patient due to cost of medication and wife aware. Discharge options were discussed with SW and [name] decided to take patient home and requested home health services.   Recommendations for Outpatient Follow-up:  1. Follow up with PCP in 1-2 weeks 2. Please obtain BMP/CBC on 05/17/16    Discharge Condition: Stable CODE STATUS: FULL Diet recommendation: Heart Healthy  Brief/Interim Summary: 60 y.o.malewith a medical history of diastolic heart failure, diabetes, coronary artery disease, who presented to the emergency department with complaints of fever, nausea and vomiting. Patient states his symptoms started approximately 3 days ago. He was recently admitted and discharged on 8/17/2017for osteomyelitis ulceration of the fifth metatarsal and left cuboid. His antibiotics were recently discontinued on 04/30/2016. He had not noticed any discharge from his foot although he does have wound care nursing at home. He began to have diarrhea approximately one day ago with nausea and vomiting however those things of subsided. He has noticed a low-grade fever at home along with chills. He stated his temperature was less than 100.37F. He also endorses malodorous urine with some dysuria. The patient was admitted and started on intravenous antibiotics.  Orthopedics, [name] was consulted.  Discharge Diagnoses:  Left Diabetic foot infection/Osteomyelitis -Patient presented to the emergency department with low-grade fever of 99.17F.  -Left foot x-ray shows prior amputations, loss of bone along the proximal fourth metatarsal as well as a lateral cuboid, concern for osteomyelitis. No abscess. No acute fracture or dislocation. -08/16/2017excision of the base of the fifth  metatarsal as well as partial excision of the cuboid--discharged on 04/26/2016 -He was on antibiotics as an outpatient-- these were discontinued on 04/30/2016 -Orthopedics consulted and appreciated and obtained MRI of the foot: 3.2 x 3.6 x 6.1 cm fluid collection concerning for abscess extending to the distal aspect of the cuboid and base of the fourth metatarsal -Continue vancomycin, cefepime, Flagyl--D#7 -plan to d/c with doxy and amox/clavulanate x 21 more days to complete 28 days therapy -Blood cultures show no growth to date -05/04/16 ESR 98, CRP 8.0(elevated) -[name] assessed patient and felt that patient's foot had no swelling, cellulitis, drainage, odor.  -Strict nonweightbearing LLE.  -PT eval-->SNF -05/11/16--recheck ESR--111 and JJO--8.4  Chronic diastolic CHF -Echocardiogram 09/27/2014 showed an EF of 16-60%, grade 2 diastolic dysfunction -Monitor intake and output, daily weights -Continue torsemide, Coreg, olmesartan  CKD stage 2 -baseline creatinine 1.2-1.4  Essential hypertension -Stable, continue Azor, Coreg, Imdur  Coronary artery disease -Stable, currently no complaints of chest pain. -Continue Coreg, aspirin, Plavix, Imdur, Lipitor, zetia, statin  Diabetes mellitus, type II with peripheral neuropathy -Hold Glucovance--restart after discharge -Continue Lantus, and novolog -Continue insulin slidingscale CBG monitoring -hemoglobin A1c on 04/25/2016 was 9.2, prior to that 6.8 -Continue Lyrica -increasedlantus to 60u, increase apidra to 13 units with meals  Chronic anemia -Baseline hemoglobin 9-10 -Continue to monitor CBC  Chronic pain -Continue home regimen: Fentanyl patch, naproxen, mobic, Dilaudid, oxycodone  Diarrhea -Resolved -Patient endorsed a few episodes of diarrhea on 05/03/2016, prior to admission. Feels this is due to drinking cold apple juice.   Dysuria -UA unremarkable for infection, however patient was previously on antibiotics  until 04/30/2016 -Patient placed on antibiotics for possible foot osteo  GERD -Continue PPI  Discharge Instructions  Discharge Instructions    Diet Carb Modified  Complete by:  As directed   Increase activity slowly    Complete by:  As directed       Medication List    STOP taking these medications   insulin glargine 100 unit/mL Sopn Commonly known as:  LANTUS Replaced by:  insulin glargine 100 UNIT/ML injection     TAKE these medications   ALEVE 220 MG tablet Generic drug:  naproxen sodium Take 220 mg by mouth 2 (two) times daily with a meal.   ALPRAZolam 0.25 MG tablet Commonly known as:  XANAX Take 0.25 mg by mouth daily.   amoxicillin-clavulanate 875-125 MG tablet Commonly known as:  AUGMENTIN Take 1 tablet by mouth every 12 (twelve) hours.   aspirin EC 81 MG tablet Take 81 mg by mouth daily.   atorvastatin 40 MG tablet Commonly known as:  LIPITOR Take 40 mg by mouth daily.   AZOR 5-20 MG tablet Generic drug:  amLODipine-olmesartan Take 1 tablet by mouth at bedtime.   carvedilol 6.25 MG tablet Commonly known as:  COREG Take 6.25 mg by mouth at bedtime.   carvedilol 25 MG tablet Commonly known as:  COREG Take 25 mg by mouth daily.   clobetasol 0.05 % external solution Commonly known as:  TEMOVATE Apply 1 application topically daily as needed (dry scalp).   clopidogrel 75 MG tablet Commonly known as:  PLAVIX Take 75 mg by mouth daily.   clotrimazole-betamethasone cream Commonly known as:  LOTRISONE Apply 1 application topically daily as needed (dry skin - beard).   collagenase ointment Commonly known as:  SANTYL Apply topically daily. What changed:  how much to take   doxycycline 100 MG tablet Commonly known as:  VIBRA-TABS Take 1 tablet (100 mg total) by mouth every 12 (twelve) hours.   ezetimibe 10 MG tablet Commonly known as:  ZETIA Take 10 mg by mouth daily.   fentaNYL 50 MCG/HR Commonly known as:  DURAGESIC - dosed  mcg/hr Place 1 patch (50 mcg total) onto the skin every 3 (three) days.   fluticasone 0.05 % cream Commonly known as:  CUTIVATE Apply 1 application topically 2 (two) times daily as needed (dry skin on face).   glyBURIDE-metformin 2.5-500 MG tablet Commonly known as:  GLUCOVANCE Take 2 tablets by mouth 2 (two) times daily with a meal.   HYDROmorphone 2 MG tablet Commonly known as:  DILAUDID Take 1 tablet (2 mg total) by mouth at bedtime.   insulin glargine 100 UNIT/ML injection Commonly known as:  LANTUS Inject 0.6 mLs (60 Units total) into the skin at bedtime. Replaces:  insulin glargine 100 unit/mL Sopn   Insulin Glulisine 100 UNIT/ML Solostar Pen Commonly known as:  APIDRA Inject 13 Units into the skin 3 (three) times daily with meals. What changed:  how much to take   isosorbide mononitrate 120 MG 24 hr tablet Commonly known as:  IMDUR Take 120 mg by mouth daily.   linaclotide 145 MCG Caps capsule Commonly known as:  LINZESS Take 1 capsule (145 mcg total) by mouth daily as needed. What changed:  reasons to take this   meloxicam 15 MG tablet Commonly known as:  MOBIC Take 15 mg by mouth at bedtime.   multivitamin with minerals Tabs tablet Take 1 tablet by mouth daily.   NITRO-BID 2 % ointment Generic drug:  nitroGLYCERIN Apply 0.75 inches topically daily as needed (esophogeal spasms). What changed:  Another medication with the same name was changed. Make sure you understand how and when to take each.  nitroGLYCERIN 0.4 MG SL tablet Commonly known as:  NITROSTAT Place 1 tablet (0.4 mg total) under the tongue every 5 (five) minutes as needed (as needed for esophageal spasm). What changed:  reasons to take this   omega-3 acid ethyl esters 1 g capsule Commonly known as:  LOVAZA Take 2 g by mouth 2 (two) times daily.   ondansetron 4 MG disintegrating tablet Commonly known as:  ZOFRAN-ODT Take 4 mg by mouth 4 (four) times daily as needed for nausea.    oxyCODONE-acetaminophen 5-325 MG tablet Commonly known as:  PERCOCET/ROXICET Take 1 tablet by mouth every 8 (eight) hours as needed. What changed:  when to take this  reasons to take this  additional instructions   pantoprazole 40 MG tablet Commonly known as:  PROTONIX Take 1 tablet (40 mg total) by mouth daily before breakfast.   pregabalin 100 MG capsule Commonly known as:  LYRICA Take 1 capsule (100 mg total) by mouth 3 (three) times daily.   rOPINIRole 2 MG tablet Commonly known as:  REQUIP Take 2 mg by mouth at bedtime.   saccharomyces boulardii 250 MG capsule Commonly known as:  FLORASTOR Take 1 capsule (250 mg total) by mouth 2 (two) times daily.   SYSTANE ULTRA OP Place 1 drop into both eyes as needed (for dry eyes).   tamsulosin 0.4 MG Caps capsule Commonly known as:  FLOMAX Take 0.4 mg by mouth daily.   torsemide 20 MG tablet Commonly known as:  DEMADEX Take 40 mg by mouth daily.      Follow-up Information    Harold Bolt, MD Follow up in 2 week(s).   Specialty:  Endocrinology Contact information: [address] [place] [address] [phone]        Newt Minion, MD Follow up in 2 week(s).   Specialty:  Orthopedic Surgery Contact information: [address] [place] [address] [phone]          Allergies  Allergen Reactions  . Ivp Dye [Iodinated Diagnostic Agents] Anaphylaxis and Other (See Comments)    Breathing problems   . Adhesive [Tape] Rash    Rash after 2-3 days use    Consultations:  Ortho--[name]   Procedures/Studies: Dg Chest 2 View  Result Date: 05/08/2016 CLINICAL DATA:  Wheezing. EXAM: CHEST  2 VIEW COMPARISON:  05/04/2016. FINDINGS: The lungs are clear wiithout focal pneumonia, edema, pneumothorax or pleural effusion. The cardiopericardial silhouette is within normal limits for size. The visualized bony structures of the thorax are intact. IMPRESSION: No active  cardiopulmonary disease. Electronically Signed   By: Misty Stanley M.D.   On: 05/08/2016 11:57   Dg Chest 2 View  Result Date: 05/04/2016 CLINICAL DATA:  Chest pain EXAM: CHEST  2 VIEW COMPARISON:  12/16/2015 FINDINGS: The heart size appears normal. There is no pleural effusion or edema. No airspace consolidation identified. The lung volumes appear low. There is mild spondylosis within the thoracic spine. IMPRESSION: 1. No acute cardiopulmonary abnormalities. Electronically Signed   By: Kerby Moors M.D.   On: 05/04/2016 13:36   Mr Foot Left W Wo Contrast  Result Date: 05/05/2016 CLINICAL DATA:  Infected left lateral foot. Status post surgery 04/25/2016. EXAM: MRI OF THE LEFT FOREFOOT WITHOUT AND WITH CONTRAST TECHNIQUE: Multiplanar, multisequence MR imaging was performed both before and after administration of intravenous contrast. CONTRAST:  22m MULTIHANCE GADOBENATE DIMEGLUMINE 529 MG/ML IV SOLN COMPARISON:  None. FINDINGS: Bones/Joint/Cartilage Interval resection of the remainder the fifth metatarsal and partial distal cuboid  resection. Prior resection of the fourth phalanges. Marrow edema at the base of the fourth metatarsal with mild cortical regularity along the lateral aspect. No acute fracture or dislocation. Normal alignment. No joint effusion. 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 Mild osteoarthritis of the first MTP joint. Ligaments Collateral ligaments are intact. Muscles and Tendons Flexor, extensor and peroneal tendons are intact. Generalized muscle edema likely reactive. Soft tissue Severe soft tissue edema and enhancement along the lateral aspect of the foot. Large fluid collection measuring 3.2 x 3.6 x 6.1 cm with peripheral enhancement concerning for an abscess extending to the distal aspect of the cuboid and base of the fourth metatarsal. No soft tissue mass. IMPRESSION: 1. Severe soft tissue edema and enhancement along the lateral aspect of the foot.  Large fluid collection measuring 3.2 x 3.6 x 6.1 cm with peripheral enhancement concerning for an abscess extending to the distal aspect of the cuboid and base of the fourth metatarsal. Cortical irregularity and edema within the distal aspect of the cuboid and lateral aspect of the base of the fourth metatarsal which may reflect postsurgical changes versus osteomyelitis. 2. 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 Electronically Signed   By: Kathreen Devoid   On: 05/05/2016 15:06   Dg Foot Complete Left  Result Date: 05/04/2016 CLINICAL DATA:  Diabetic with soft tissue wound EXAM: LEFT FOOT - COMPLETE 3+ VIEW COMPARISON:  December 16, 2015 FINDINGS: Frontal, oblique, and lateral views were obtained. There has been interval removal of the remainder of the fifth metatarsal. Currently there is been amputation of the right fifth metatarsal and fifth phalanges as well as the fourth proximal, middle, distal phalanges. On the current examination, there is soft tissue prominence laterally without radiopaque foreign body. There is subtle loss of bony definition along the lateral aspect of the cuboid bone as well as along the lateral proximal aspect of the fourth metatarsal. These findings are concerning for potential osteomyelitis. There is no acute fracture or dislocation. There is moderate narrowing of all remaining PIP and DIP joints. No erosive change. There is spurring in the dorsal midfoot. There is a spur arising from the inferior calcaneus. There is atherosclerotic calcification anterior to the ankle joint. Note that there is generalized soft tissue edema, particularly in the dorsum of the foot. IMPRESSION: Prior amputations as noted above. Loss of bony definition along the lateral aspect of the proximal fourth metatarsal as well as along the lateral cuboid. These findings raise concern for osteomyelitis in these areas. There is soft tissue prominence lateral to these areas without  well-defined soft tissue abscess. There is generalized soft tissue edema most notably dorsally. No acute fracture or dislocation. Joint space narrowing at several sites. MR or three-phase nuclear medicine bone scan could be helpful for further assessment with respect to potential osteomyelitis. Electronically Signed   By: Lowella Grip III M.D.   On: 05/04/2016 14:44        Discharge Exam: Vitals:   05/11/16 0456 05/11/16 0958  BP: 140/69 (!) 153/65  Pulse: 62 73  Resp: 18 18  Temp: 99 F (37.2 C) 99.1 F (37.3 C)   Vitals:   05/10/16 1700 05/10/16 2050 05/11/16 0456 05/11/16 0958  BP: (!) 144/63 (!) 142/62 140/69 (!) 153/65  Pulse: 79 72 62 73  Resp: 18 18 18 18   Temp: 99.6 F (37.6 C) 99.5 F (37.5 C) 99 F (37.2 C) 99.1 F (37.3 C)  TempSrc: Oral Oral Oral Oral  SpO2: 100% 97% 98% 97%  Weight:  111.6 kg (246 lb)    Height:  6' 2"  (1.88 m)      General: Pt is alert, awake, not in acute distress Cardiovascular: RRR, S1/S2 +, no rubs, no gallops Respiratory: CTA bilaterally, no wheezing, no rhonchi Abdominal: Soft, NT, ND, bowel sounds + Extremities: no edema, no cyanosis   The results of significant diagnostics from this hospitalization (including imaging, microbiology, ancillary and laboratory) are listed below for reference.    Significant Diagnostic Studies: Dg Chest 2 View  Result Date: 05/08/2016 CLINICAL DATA:  Wheezing. EXAM: CHEST  2 VIEW COMPARISON:  05/04/2016. FINDINGS: The lungs are clear wiithout focal pneumonia, edema, pneumothorax or pleural effusion. The cardiopericardial silhouette is within normal limits for size. The visualized bony structures of the thorax are intact. IMPRESSION: No active cardiopulmonary disease. Electronically Signed   By: Misty Stanley M.D.   On: 05/08/2016 11:57   Dg Chest 2 View  Result Date: 05/04/2016 CLINICAL DATA:  Chest pain EXAM: CHEST  2 VIEW COMPARISON:  12/16/2015 FINDINGS: The heart size appears normal. There is  no pleural effusion or edema. No airspace consolidation identified. The lung volumes appear low. There is mild spondylosis within the thoracic spine. IMPRESSION: 1. No acute cardiopulmonary abnormalities. Electronically Signed   By: Kerby Moors M.D.   On: 05/04/2016 13:36   Mr Foot Left W Wo Contrast  Result Date: 05/05/2016 CLINICAL DATA:  Infected left lateral foot. Status post surgery 04/25/2016. EXAM: MRI OF THE LEFT FOREFOOT WITHOUT AND WITH CONTRAST TECHNIQUE: Multiplanar, multisequence MR imaging was performed both before and after administration of intravenous contrast. CONTRAST:  66m MULTIHANCE GADOBENATE DIMEGLUMINE 529 MG/ML IV SOLN COMPARISON:  None. FINDINGS: Bones/Joint/Cartilage Interval resection of the remainder the fifth metatarsal and partial distal cuboid resection. Prior resection of the fourth phalanges. Marrow edema at the base of the fourth metatarsal with mild cortical regularity along the lateral aspect. No acute fracture or dislocation. Normal alignment. No joint effusion. 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 Mild osteoarthritis of the first MTP joint. Ligaments Collateral ligaments are intact. Muscles and Tendons Flexor, extensor and peroneal tendons are intact. Generalized muscle edema likely reactive. Soft tissue Severe soft tissue edema and enhancement along the lateral aspect of the foot. Large fluid collection measuring 3.2 x 3.6 x 6.1 cm with peripheral enhancement concerning for an abscess extending to the distal aspect of the cuboid and base of the fourth metatarsal. No soft tissue mass. IMPRESSION: 1. Severe soft tissue edema and enhancement along the lateral aspect of the foot. Large fluid collection measuring 3.2 x 3.6 x 6.1 cm with peripheral enhancement concerning for an abscess extending to the distal aspect of the cuboid and base of the fourth metatarsal. Cortical irregularity and edema within the distal aspect of the cuboid and  lateral aspect of the base of the fourth metatarsal which may reflect postsurgical changes versus osteomyelitis. 2. 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 Electronically Signed   By: HKathreen Devoid  On: 05/05/2016 15:06   Dg Foot Complete Left  Result Date: 05/04/2016 CLINICAL DATA:  Diabetic with soft tissue wound EXAM: LEFT FOOT - COMPLETE 3+ VIEW COMPARISON:  December 16, 2015 FINDINGS: Frontal, oblique, and lateral views were obtained. There has been interval removal of the remainder of the fifth metatarsal. Currently there is been amputation of the right fifth metatarsal and fifth phalanges as well as the fourth proximal, middle, distal phalanges. On the  current examination, there is soft tissue prominence laterally without radiopaque foreign body. There is subtle loss of bony definition along the lateral aspect of the cuboid bone as well as along the lateral proximal aspect of the fourth metatarsal. These findings are concerning for potential osteomyelitis. There is no acute fracture or dislocation. There is moderate narrowing of all remaining PIP and DIP joints. No erosive change. There is spurring in the dorsal midfoot. There is a spur arising from the inferior calcaneus. There is atherosclerotic calcification anterior to the ankle joint. Note that there is generalized soft tissue edema, particularly in the dorsum of the foot. IMPRESSION: Prior amputations as noted above. Loss of bony definition along the lateral aspect of the proximal fourth metatarsal as well as along the lateral cuboid. These findings raise concern for osteomyelitis in these areas. There is soft tissue prominence lateral to these areas without well-defined soft tissue abscess. There is generalized soft tissue edema most notably dorsally. No acute fracture or dislocation. Joint space narrowing at several sites. MR or three-phase nuclear medicine bone scan could be helpful for further assessment with respect to  potential osteomyelitis. Electronically Signed   By: Lowella Grip III M.D.   On: 05/04/2016 14:44     Microbiology: Recent Results (from the past 240 hour(s))  Blood Cultures x 2 sites     Status: None   Collection Time: 05/04/16  7:15 PM  Result Value Ref Range Status   Specimen Description BLOOD RIGHT ANTECUBITAL  Final   Special Requests BOTTLES DRAWN AEROBIC AND ANAEROBIC 10CC  Final   Culture NO GROWTH 5 DAYS  Final   Report Status 05/09/2016 FINAL  Final  Blood Cultures x 2 sites     Status: None   Collection Time: 05/04/16  7:30 PM  Result Value Ref Range Status   Specimen Description BLOOD BLOOD RIGHT FOREARM  Final   Special Requests BOTTLES DRAWN AEROBIC ONLY 8CC  Final   Culture NO GROWTH 5 DAYS  Final   Report Status 05/09/2016 FINAL  Final     Labs: Basic Metabolic Panel:  Recent Labs Lab 05/05/16 3832 05/06/16 0424 05/07/16 0520 05/08/16 0835 05/09/16 0451 05/11/16 0636  NA 135 133* 131* 134* 134*  --   K 4.0 4.3 3.8 4.1 4.2  --   CL 100* 99* 98* 99* 96*  --   CO2 24 27 23 27 28   --   GLUCOSE 270* 278* 194* 185* 241*  --   BUN 18 22* 21* 26* 27*  --   CREATININE 1.43* 1.40* 1.33* 1.42* 1.41* 1.41*  CALCIUM 8.4* 8.4* 8.5* 8.6* 8.5*  --    Liver Function Tests:  Recent Labs Lab 05/04/16 1250  AST 16  ALT 12*  ALKPHOS 107  BILITOT 0.6  PROT 6.2*  ALBUMIN 2.7*   No results for input(s): LIPASE, AMYLASE in the last 168 hours. No results for input(s): AMMONIA in the last 168 hours. CBC:  Recent Labs Lab 05/04/16 1250 05/04/16 1934 05/05/16 9191 05/06/16 0424 05/07/16 0520 05/08/16 0835 05/09/16 0451  WBC 9.6 8.9 7.8 9.0 11.0* 10.7* 7.9  NEUTROABS 7.6 6.8  --   --   --   --   --   HGB 10.5* 10.3* 9.4* 9.1* 9.6* 8.7* 8.7*  HCT 30.8* 30.7* 29.1* 27.9* 28.8* 25.7* 26.0*  MCV 80.8 81.4 83.4 82.5 82.1 81.3 81.8  PLT 310 320 274 281 313 342 307   Cardiac Enzymes: No results for input(s): CKTOTAL, CKMB, CKMBINDEX, TROPONINI in  the last  168 hours. BNP: Invalid input(s): POCBNP CBG:  Recent Labs Lab 05/10/16 1150 05/10/16 1631 05/10/16 2127 05/11/16 0750 05/11/16 1114  GLUCAP 207* 250* 321* 193* 216*    Time coordinating discharge:  Greater than 30 minutes  Signed:  Lenah Messenger, DO [facility] Pager: [phone] 05/11/2016, 12:10 PM

## 2016-05-10 NOTE — Progress Notes (Signed)
Physical Therapy Treatment Patient Details Name: TSHAWN VOLBRECHT MRN: [id number] DOB: 04-Sep-1956 Today's Date: 05/10/2016    History of Present Illness NASIR CAM is a 60 y.o. male with a medical history of diastolic heart failure, diabetes, coronary artery disease, who presented to the emergency department with complaints of fever, nausea and vomiting. Patient states his symptoms started approximately 3 days ago. He was recently admitted and discharged on 04/26/2016 for osteomyelitis ulceration of the fifth metatarsal and left cuboid. His antibiotics were recently discontinued on 04/30/2016. Left Diabetic foot infection/osteomyelitis; Strict NWB LLE; PMH of RBKA (as of eval on 8/29, prosthesis not in room)    PT Comments    Pt does not have his R BKA prosthesis, stated he wouldn't be able to don it because he needs to stand on LLE to fully push RLE into prosthesis. He declined transfer to recliner but agreed to BLE exercises. Encouraged pt to continue isometric exercises for quads and glutes independently in bed.   Follow Up Recommendations  SNF     Equipment Recommendations  Other (comment) (Will consider sliding board)    Recommendations for Other Services OT consult     Precautions / Restrictions Precautions Precautions: Fall Precaution Comments: R BKA Restrictions Weight Bearing Restrictions: Yes LLE Weight Bearing: Non weight bearing    Mobility  Bed Mobility Overal bed mobility: Modified Independent       Supine to sit: Modified independent (Device/Increase time)     General bed mobility comments: Used bedrails  Transfers                 General transfer comment: pt declined transfer, stated he doesn't want to sit up in recliner  Ambulation/Gait                 Stairs            Wheelchair Mobility    Modified Rankin (Stroke Patients Only)       Balance   Sitting-balance support: Single extremity supported Sitting balance-Leahy  Scale: Good                              Cognition Arousal/Alertness: Awake/alert Behavior During Therapy: WFL for tasks assessed/performed Overall Cognitive Status: Within Functional Limits for tasks assessed                      Exercises General Exercises - Lower Extremity Ankle Circles/Pumps: AROM;Left;Seated;10 reps Long Arc Quad: AROM;Left;10 reps;Seated (with manual resistance) Hip ABduction/ADduction: AROM;Both;10 reps;Seated (with manual resistance) Hip Flexion/Marching: AROM;Left;Seated;10 reps (with manual resistance)    General Comments        Pertinent Vitals/Pain Pain Score: 7  Pain Location: L foot Pain Descriptors / Indicators: Sore Pain Intervention(s): Premedicated before session;Monitored during session;Limited activity within patient's tolerance    Home Living                      Prior Function            PT Goals (current goals can now be found in the care plan section) Acute Rehab PT Goals Patient Stated Goal: go to rehab PT Goal Formulation: With patient Time For Goal Achievement: 05/22/16 Potential to Achieve Goals: Good Progress towards PT goals: Progressing toward goals    Frequency  Min 3X/week    PT Plan Current plan remains appropriate    Co-evaluation  End of Session   Activity Tolerance: Patient tolerated treatment well Patient left: in bed;with call bell/phone within reach     Time: [id number] PT Time Calculation (min) (ACUTE ONLY): 24 min  Charges:  $Therapeutic Exercise: 23-37 mins                    G Codes:      Philomena Doheny 05/10/2016, 11:26 AM [phone]

## 2016-05-11 LAB — GLUCOSE, CAPILLARY
Glucose-Capillary: 193 mg/dL — ABNORMAL HIGH (ref 65–99)
Glucose-Capillary: 216 mg/dL — ABNORMAL HIGH (ref 65–99)
Glucose-Capillary: 212 mg/dL — ABNORMAL HIGH (ref 65–99)

## 2016-05-11 LAB — CREATININE, SERUM
Creatinine, Ser: 1.41 mg/dL — ABNORMAL HIGH (ref 0.61–1.24)
GFR calc Af Amer: >60 mL/min (ref >60)
GFR calc non Af Amer: 52 mL/min — ABNORMAL LOW (ref >60)

## 2016-05-11 LAB — SEDIMENTATION RATE: Sed Rate: 111 mm/hr — ABNORMAL HIGH (ref 0–16)

## 2016-05-11 LAB — C-REACTIVE PROTEIN: CRP: 4.7 mg/dL — ABNORMAL HIGH (ref <1.0)

## 2016-05-11 MED ORDER — INSULIN GLARGINE 100 UNIT/ML ~~LOC~~ SOLN: 60.0000 [IU] | Freq: Every day | SUBCUTANEOUS | 0 refills | Status: DC

## 2016-05-11 MED ORDER — DOXYCYCLINE HYCLATE 100 MG PO TABS
100.0000 mg | ORAL_TABLET | Freq: Two times a day (BID) | ORAL | Status: DC
  Administered 2016-05-11: 100 mg via ORAL
  Filled 2016-05-11: qty 1

## 2016-05-11 MED ORDER — AMOXICILLIN-POT CLAVULANATE 875-125 MG PO TABS: 1.0000 | ORAL_TABLET | Freq: Two times a day (BID) | ORAL | 0 refills | Status: DC

## 2016-05-11 MED ORDER — INSULIN GLULISINE 100 UNIT/ML SOLOSTAR PEN: 13.0000 [IU] | PEN_INJECTOR | Freq: Three times a day (TID) | SUBCUTANEOUS | 0 refills | Status: DC

## 2016-05-11 MED ORDER — DOXYCYCLINE HYCLATE 100 MG PO TABS: 100.0000 mg | ORAL_TABLET | Freq: Two times a day (BID) | ORAL | 0 refills | Status: DC

## 2016-05-11 MED ORDER — AMOXICILLIN-POT CLAVULANATE 875-125 MG PO TABS
1.0000 | ORAL_TABLET | Freq: Two times a day (BID) | ORAL | Status: DC
  Administered 2016-05-11: 1 via ORAL
  Filled 2016-05-11: qty 1

## 2016-05-11 NOTE — Clinical Social Work Note (Signed)
Talked with Mrs. Klos regarding patient's discharge. [facility] will not take patient due to cost of medication and wife aware. Discharge options were discussed and Mrs. Slott decided to take patient home and requested home health services. Wife informed that nurse case manager will be in touch with her regarding [place] services. CSW signing off, however please reconsult if any SW services needed before discharge.  Malcome Ambrocio Givens, MSW, LCSW Licensed Clinical Social Worker [place] [phone]

## 2016-05-11 NOTE — Care Management Note (Signed)
Case Management Note  Patient Details  Name: [name] MRN: [id number] Date of Birth: 06-Oct-1955  Subjective/Objective:   CM following for progression and d/c planning. Plan for SNF placement has changed due to pt insurance limited services to family desired facilities. Therefore plan now is to d/c to home with [facility] services.                  Action/Plan: 05/11/16 Spoke with pt wife who request [facility] services, they are currently active with this agency. [place] notified of need to resume services. Requesting [facility], [place], [place] and [place] aide. Per pt wife they have no further DME needs. Pt has ramps, motorized and manuel wheelchairs, sliding board, and drop arm BSC. She will pick pt up after her work today.   Expected Discharge Date:     05/11/2016             Expected Discharge Plan:  [place]  In-House Referral:  NA  Discharge planning Services  NA, CM Consult  Post Acute Care Choice:  Home Health Choice offered to:  Spouse  DME Arranged:  N/A DME Agency:  NA  HH Arranged:  RN, PT, OT, Nurse's Aide [place] Agency:  [place]  Status of Service:  Completed, signed off  If discussed at [place] of Stay Meetings, dates discussed:    Additional Comments:  Adron Bene, RN 05/11/2016, 10:21 AM

## 2016-05-11 NOTE — Progress Notes (Signed)
D/C instructions reviewed with patient and questions answered. Scripts retrieved from chart and given also.

## 2016-05-11 NOTE — Progress Notes (Signed)
PT Cancellation Note  Patient Details Name: [name] MRN: [id number] DOB: 1955-12-05   Cancelled Treatment:    Reason Eval/Treat Not Completed: Patient declined, no reason specified.  Patient reports he does not want to get into chair today.  Declines PT.   Despina Pole 05/11/2016, 3:53 PM Carita Pian. Sanjuana Kava, [place] Pager [phone]

## 2016-06-14 ENCOUNTER — Ambulatory Visit (INDEPENDENT_AMBULATORY_CARE_PROVIDER_SITE_OTHER): Payer: Medicare Other | Admitting: Orthopedic Surgery

## 2016-06-14 DIAGNOSIS — E1142 Type 2 diabetes mellitus with diabetic polyneuropathy: Secondary | ICD-10-CM

## 2016-06-14 DIAGNOSIS — Z89422 Acquired absence of other left toe(s): Secondary | ICD-10-CM

## 2016-06-21 ENCOUNTER — Encounter: Payer: Self-pay | Admitting: Family

## 2016-06-27 ENCOUNTER — Ambulatory Visit: Payer: Medicare Other | Admitting: Family

## 2016-06-27 ENCOUNTER — Encounter (HOSPITAL_COMMUNITY): Payer: Medicare Other

## 2016-07-02 ENCOUNTER — Ambulatory Visit (INDEPENDENT_AMBULATORY_CARE_PROVIDER_SITE_OTHER): Payer: Medicare Other | Admitting: Orthopedic Surgery

## 2016-07-12 ENCOUNTER — Ambulatory Visit (INDEPENDENT_AMBULATORY_CARE_PROVIDER_SITE_OTHER): Payer: BLUE CROSS/BLUE SHIELD | Admitting: Orthopedic Surgery

## 2016-07-12 ENCOUNTER — Encounter (INDEPENDENT_AMBULATORY_CARE_PROVIDER_SITE_OTHER): Payer: Self-pay | Admitting: Orthopedic Surgery

## 2016-07-12 VITALS — Ht 74.0 in | Wt 250.0 lb

## 2016-07-12 DIAGNOSIS — Z89511 Acquired absence of right leg below knee: Secondary | ICD-10-CM | POA: Insufficient documentation

## 2016-07-12 DIAGNOSIS — Z89422 Acquired absence of other left toe(s): Secondary | ICD-10-CM

## 2016-07-12 DIAGNOSIS — I6523 Occlusion and stenosis of bilateral carotid arteries: Secondary | ICD-10-CM

## 2016-07-12 HISTORY — DX: Acquired absence of right leg below knee: Z89.511

## 2016-07-12 NOTE — Progress Notes (Signed)
Wound Care Note   Patient: [name]           Date of Birth: 08/05/1956           MRN: [id number]             PCP: [name], MD Visit Date: 07/12/2016   Assessment & Plan: Visit Diagnoses:  1. History of right below knee amputation ([place])   2. S/P amputation of lesser toe, left (HCC)     Plan: Patient is given a prescription for a new socket for his right transtibial amputation. Patient is given a prescription for double upright brace is on the left with a new orthoticon the left with [employer]. Patient has an unstable  socket on the right causing ulcers both laterally and inferiorly. Patient will need a new socket on the right. Patient has instability of the ankle and subtalar joint of the left due to the lateral column amputation patient will need double upright brace is on the left with a new orthotic. There is no signs of infection no open wounds left foot.  Follow-Up Instructions: Return in about 4 weeks (around 08/09/2016).  Orders:  No orders of the defined types were placed in this encounter.  No orders of the defined types were placed in this encounter.     Procedures: No notes on file   Clinical Data: No additional findings.   No images are attached to the encounter.   Subjective: Chief Complaint  Patient presents with  . Left Foot - Routine Post Op    04/25/16 left excision base 5th MT and partial cubiod    Pt is 11 weeks out from an excision base of the 5th metatarsal and proximal cuboid of the left foot. Well healed and requesting rx for [employer] insert for shoe. Complaints of foot turning and causing to much pressure on knee due to change in gait.    Review of Systems  Miscellaneous:  -Home Health Care: N/A  -Physical Therapy: N/A  -Out of Work?: N/A  -Worker's Compensation Case?: N/A  -Additional Information: Follow up with [employer] for orthotic and prosthetics.   Objective: Vital Signs: Ht 6\' 2"  (1.88 m)   Wt 250 lb (113.4 kg)   BMI  32.10 kg/m   Physical Exam: Patient is alert oriented no adenopathy well-dressed normal affect normal respiratory effort.  Patient ambulates in a wheelchair. Examination of his right transtibial amputation is developing ulceration inferiorly and laterally over the fibula. He has lost significant amount of residual volume for the right below the knee amputation there is no redness no cellulitis no signs of infection. Examination of left foot he has instability ankle and subtalar joint with his body weight she is unable to maintain plantar grade foot on the left. The leg has a significant amount of venous stasis swelling he is wearing compression socks. He states his leg has been dependent. There is no redness no saline is an open venous stasis ulcers the surgical incision is well healed laterally and there is no signs of any infection.  pecialty Comments: No specialty comments available.   PMFS History: Patient Active Problem List   Diagnosis Date Noted  . History of right below knee amputation ([place]) 07/12/2016  . Poorly controlled type 2 diabetes mellitus with peripheral neuropathy ([place]) 05/09/2016  . Acute osteomyelitis of left foot ([place])   . Pressure ulcer 05/08/2016  . Diabetic foot infection ([place]) 05/04/2016  . Diabetic foot ([place]) 05/04/2016  . Osteomyelitis of ankle  or foot, acute ([place]) 04/25/2016  . Type 2 diabetes mellitus with insulin therapy ([place]) 12/16/2015  . Amputated toe ([place]) 10/21/2015  . Diabetic foot ulcer ([place]) 10/12/2015  . Cellulitis of left foot 09/03/2015  . Leg edema, left 09/03/2015  . Acute renal failure ([place]) 09/03/2015  . CAD (dz of distal, mid and proximal RCA with implantation of 3 overlapping drug-eluting stent,) 09/03/2015  . Chronic diastolic heart failure, NYHA class 2 ([place]) 09/03/2015  . Opacity of lung on imaging study 01/14/2015  . Angina pectoris associated with type 2 diabetes mellitus ([place]) 10/28/2014  . Hyperlipidemia 08/25/2014  . Chronic total  occlusion of artery of the extremities ([place]) 04/08/2012  . Onychomycosis 02/01/2012  . Diabetes mellitus, insulin dependent (IDDM), controlled ([place]) 11/01/2011  . Occlusion and stenosis of carotid artery without mention of cerebral infarction 06/12/2011  . Aftercare following surgery of the circulatory system, [place] 06/12/2011  . GERD (gastroesophageal reflux disease) 05/08/2011  . Barrett's esophagus without dysplasia 05/08/2011  . PVD (peripheral vascular disease) ([place]) 02/01/2011  . HTN (hypertension) 02/01/2011  . Former tobacco use 02/01/2011   Past Medical History:  Diagnosis Date  . Barrett's esophagus   . Carotid artery occlusion 11/10/10   LEFT CAROTID ENDARTERECTOMY  . CHF (congestive heart failure) ([place])   . Complication of anesthesia    BP WENT UP AT [facility] "  . COPD (chronic obstructive pulmonary disease) ([place])   . Coronary artery disease   . Diabetes mellitus   . Diverticulosis of colon (without mention of hemorrhage)   . DJD (degenerative joint disease)   . Emphysema of lung ([place])   . Fatty liver   . Full dentures   . GERD (gastroesophageal reflux disease)   . H/O Doppler ultrasound 06/28/2012   Lower venous duplex left-- findings consistent with acute deep vein thrombosis involving the personal vein of the left lower extremity. No evidence of Baker's cyst on the left. Duplex scan of the left lower extremity arteries reveal adequate blood flow.   . H/O hiatal hernia   . History of cardiovascular stress test 03/23/2011   ejection fraction measured 51%, with an end-diastolic volume of 269 ml and an end-systolic volume of 81 ml. no evidence of myocardial ischemia.  Marland Kitchen History of echocardiogram 08/22/2011   Mildly hypertrophic left ventricle with normal systolic function and mild diastolic dysfunction. mildly dilated left atrium. no significant valvular abnormalities. no pericardial effusion no change from previous study 10/2010. the ejection fraction =>55%  . History of Holter  monitoring 02/14/2012   Scan quality is fair due to baseline artifact. Predominant rhythm is Sinus. Veentricular Ectopy was noted in single beat form. The total recording time was 24:00 hours. total beats =[id number]. the total time analyzed was 23:58 hours. Heart  Max = 102 at 1:18:22, Min= 52 at 9:56:19 am., Avg= 70. There were 0 pause(s) > 2.5 seconds. the longest pause = 0.00 sec. at 1:51 pm(1)  . Hyperlipidemia   . Hypertension   . Neuromuscular disorder ([place])    peripheral neuropathy  . OSA (obstructive sleep apnea) 12/26/08   AHI of 6.3/hr overall, 32.7/hr during REM sleep and an RDI of 24.8/hr and 50.9/hr.  . Osteomyelitis ([place])   . Osteomyelitis ([place])    left 5th metatarsal  . PAD (peripheral artery disease) ([place])    Distal aortogram June 2012. Atherectomy left popliteal artery July 2012.   . Sleep apnea    has not used CPAP in e years - took self off  . Slurred  speech    AS PER WIFE IN D/C NOTE 11/10/10  . Substernal chest pain   . Visual color changes    LEFT EYE; BUT NOT TOTAL BLINDNESS  . Wears glasses     Family History  Problem Relation Age of Onset  . Heart disease Father     Before age 1-  CAD, BPG  . Diabetes Father     Amputation  . Cancer Father     PROSTATE  . Hyperlipidemia Father   . Hypertension Father   . Heart attack Father     Triple BPG  . Varicose Veins Father   . Colon cancer Brother   . Cancer Brother     Colon  . Diabetes Brother   . Heart disease Brother 11    A-Fib. Before age 62  . Hyperlipidemia Brother   . Hypertension Brother   . Cancer Sister     Breast  . Hyperlipidemia Sister   . Hypertension Sister   . Hypertension Son   . Arthritis      GRANDMOTHER  . Hypertension      OTHER FAMILY MEMBERS   Past Surgical History:  Procedure Laterality Date  . AMPUTATION  11/05/2011   Procedure: AMPUTATION RAY;  Surgeon: Wylene Simmer, MD;  Location: [place];  Service: Orthopedics;  Laterality: Right;  Amputation of Right 4&5th Toes  . AMPUTATION  Left 11/26/2012   Procedure: AMPUTATION RAY;  Surgeon: Wylene Simmer, MD;  Location: [facility];  Service: Orthopedics;  Laterality: Left;  fourth ray amputation  . AMPUTATION Right 08/27/2014   Procedure: Transmetatarsal Amputation;  Surgeon: Newt Minion, MD;  Location: [facility];  Service: Orthopedics;  Laterality: Right;  . AMPUTATION Right 01/14/2015   Procedure: AMPUTATION BELOW KNEE;  Surgeon: Newt Minion, MD;  Location: [place];  Service: Orthopedics;  Laterality: Right;  . AMPUTATION Left 10/21/2015   Procedure: Left Foot 5th Ray Amputation;  Surgeon: Newt Minion, MD;  Location: [place];  Service: Orthopedics;  Laterality: Left;  . ANTERIOR FUSION CERVICAL SPINE  02/06/06   C4-5, C5-6, C6-7; SURGEON DR. MAX COHEN  . CARDIAC CATHETERIZATION  10/31/04   2009  . CAROTID ENDARTERECTOMY  11/10/10  . CAROTID ENDARTERECTOMY Left 11/10/2010   Subtotal occlusion of left internal carotid artery with left hemispheric transient ischemic attacks.  . CARPAL TUNNEL RELEASE Right 10/21/2013   Procedure: RIGHT CARPAL TUNNEL RELEASE;  Surgeon: Wynonia Sours, MD;  Location: [place];  Service: Orthopedics;  Laterality: Right;  . CHOLECYSTECTOMY    . COLONOSCOPY    . ESOPHAGEAL MANOMETRY Bilateral 07/19/2014   Procedure: ESOPHAGEAL MANOMETRY (EM);  Surgeon: Jerene Bears, MD;  Location: [facility];  Service: Gastroenterology;  Laterality: Bilateral;  . FEMORAL ARTERY STENT     x6  . FOOT SURGERY  04/25/2016    EXCISION BASE 5TH METATARSAL AND PARTIAL CUBOID LEFT FOOT  . HERNIA REPAIR     LEFT INGUINAL AND UMBILICAL REPAIRS  . I&D EXTREMITY Left 04/25/2016   Procedure: EXCISION BASE 5TH METATARSAL AND PARTIAL CUBOID LEFT FOOT;  Surgeon: Newt Minion, MD;  Location: [place] [place];  Service: Orthopedics;  Laterality: Left;  . JOINT REPLACEMENT Right 2001   Total  . LAMINECTOMY     X 3 LUMBAR AND X 2 CERVICAL SPINE OPERATIONS  . LAPAROSCOPIC CHOLECYSTECTOMY W/ CHOLANGIOGRAPHY  11/09/04   SURGEON DR. Luella Cook  . LEFT HEART CATHETERIZATION WITH CORONARY ANGIOGRAM N/A 10/29/2014   Procedure: LEFT HEART  CATHETERIZATION WITH CORONARY ANGIOGRAM;  Surgeon: Laverda Page, MD;  Location[facility] CATH LAB;  Service: Cardiovascular;  Laterality: N/A;  . LOWER EXTREMITY ANGIOGRAM N/A 03/19/2012   Procedure: LOWER EXTREMITY ANGIOGRAM;  Surgeon: Burnell Blanks, MD;  Location: [facility] CATH LAB;  Service: Cardiovascular;  Laterality: N/A;  . NECK SURGERY    . PENILE PROSTHESIS IMPLANT  08/14/05   INFRAPUBIC INSERTION OF INFLATABLE PENILE PROSTHESIS; SURGEON DR. Amalia Hailey  . PERCUTANEOUS CORONARY STENT INTERVENTION (PCI-S) Right 10/29/2014   Procedure: PERCUTANEOUS CORONARY STENT INTERVENTION (PCI-S);  Surgeon: Laverda Page, MD;  Location[facility] C[facility] LAB;  Service: Cardiovascular;  Laterality: Right;  . SHOULDER ARTHROSCOPY    . SPINE SURGERY    . TONSILLECTOMY    . TOTAL KNEE ARTHROPLASTY  07/2002   RIGHT KNEE ; SURGEON  DR. [name] ALSO HAD ARTHROSCOPIC RIGHT KNEE IN  10/2001  . ULNAR NERVE TRANSPOSITION Right 10/21/2013   Procedure: RIGHT ELBOW  ULNAR NERVE DECOMPRESSION;  Surgeon: Wynonia Sours, MD;  Location: [place];  Service: Orthopedics;  Laterality: Right;   Social History   Occupational History  . TRUCK DRIVER Unemployed   Social History Main Topics  . Smoking status: Former Smoker    Packs/day: 0.50    Years: 35.00    Types: E-cigarettes    Quit date: 10/28/2011  . Smokeless tobacco: Never Used     Comment: VAPOR CIGARETTES  . Alcohol use 0.0 oz/week     Comment: rare  . Drug use: No  . Sexual activity: Yes    Birth control/ protection: Implant

## 2016-07-20 ENCOUNTER — Encounter: Payer: Self-pay | Admitting: Family

## 2016-07-25 ENCOUNTER — Encounter: Payer: Self-pay | Admitting: Family

## 2016-07-25 ENCOUNTER — Ambulatory Visit: Payer: Medicare Other | Admitting: Family

## 2016-07-25 ENCOUNTER — Ambulatory Visit (HOSPITAL_COMMUNITY)
Admission: RE | Admit: 2016-07-25 | Discharge: 2016-07-25 | Disposition: A | Discharge status: Home / Self Care | Payer: BLUE CROSS/BLUE SHIELD | Source: Ambulatory Visit | Attending: Family | Admitting: Family

## 2016-07-25 ENCOUNTER — Encounter (HOSPITAL_COMMUNITY): Payer: Medicare Other

## 2016-07-25 ENCOUNTER — Ambulatory Visit (INDEPENDENT_AMBULATORY_CARE_PROVIDER_SITE_OTHER): Payer: BLUE CROSS/BLUE SHIELD | Admitting: Family

## 2016-07-25 VITALS — BP 191/86 | HR 75 | Temp 98.6°F | Resp 18 | Ht 74.0 in | Wt 250.0 lb

## 2016-07-25 DIAGNOSIS — Z9889 Other specified postprocedural states: Secondary | ICD-10-CM

## 2016-07-25 DIAGNOSIS — Z87891 Personal history of nicotine dependence: Secondary | ICD-10-CM

## 2016-07-25 DIAGNOSIS — Z89511 Acquired absence of right leg below knee: Secondary | ICD-10-CM | POA: Insufficient documentation

## 2016-07-25 DIAGNOSIS — Z89422 Acquired absence of other left toe(s): Secondary | ICD-10-CM | POA: Insufficient documentation

## 2016-07-25 DIAGNOSIS — I779 Disorder of arteries and arterioles, unspecified: Secondary | ICD-10-CM | POA: Diagnosis not present

## 2016-07-25 DIAGNOSIS — I739 Peripheral vascular disease, unspecified: Secondary | ICD-10-CM | POA: Diagnosis not present

## 2016-07-25 DIAGNOSIS — I6522 Occlusion and stenosis of left carotid artery: Secondary | ICD-10-CM

## 2016-07-25 DIAGNOSIS — R030 Elevated blood-pressure reading, without diagnosis of hypertension: Secondary | ICD-10-CM

## 2016-07-25 NOTE — Patient Instructions (Signed)

## 2016-07-25 NOTE — Progress Notes (Signed)
[facility] [place]   CC: Follow up peripheral artery occlusive disease  History of Present Illness [name] is a 60 y.o. male patient of Dr. Kellie Simmering who is s/p left CEA on 11/10/2010, he returns today for follow up. Pt denies any history of stroke or TIA.  He also has severe PAD. 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; states that since then his DM improved.  He had a left 5th ray amputation in February 2017 by [name] for an infected ulcer, per pt. [name] was treating the ulcer on the lateral aspect of pt's left foot which has healed.  He is working with [employer] to redo the right BKA prosthetic which is loose and starting to cause an abrasion on his stump, and an appliance to prevent supination of his left foot.  He has 6 stents in his LE's.  ESI's did not help his back issues, is considering nerve stimulator in his back; legs giving out, this apparently is attributed to his back issues. He also has DM peripheral neuropathy.  Pt states Dr.George Adams [phone]) at [facility] told pt that he now has good circulation in his legs after he had 2 stents placed in each lower leg in 2013,  at [facility].  Dr. Andree Elk, Vascular surgeon in the [place] area, was following pt's PAD. Pt states he wants [place] to follow his PAD now.   [name] is his new cardiologist as Dr. Rollene Fare is no longer available to pt. He had 3 cardiac stents placed early 2016 by [name].  Pt denies cough or dyspnea other than the occasional dyspnea he gets which is chronic.   Pt Diabetic: Yes, states in good control with last A1C at 6.2 Pt smoker: former smoker, quit February, 2013  He takes Plavix, ASA, and a statin.      Past Medical History:  Diagnosis Date  . Barrett's esophagus   . Carotid artery occlusion 11/10/10   LEFT CAROTID ENDARTERECTOMY  . CHF (congestive heart failure) ([place])   . Complication of anesthesia    BP  WENT UP AT [facility] "  . COPD (chronic obstructive pulmonary disease) ([place])   . Coronary artery disease   . Diabetes mellitus   . Diverticulosis of colon (without mention of hemorrhage)   . DJD (degenerative joint disease)   . Emphysema of lung ([place])   . Fatty liver   . Full dentures   . GERD (gastroesophageal reflux disease)   . H/O Doppler ultrasound 06/28/2012   Lower venous duplex left-- findings consistent with acute deep vein thrombosis involving the personal vein of the left lower extremity. No evidence of Baker's cyst on the left. Duplex scan of the left lower extremity arteries reveal adequate blood flow.   . H/O hiatal hernia   . History of cardiovascular stress test 03/23/2011   ejection fraction measured 51%, with an end-diastolic volume of 361 ml and an end-systolic volume of 81 ml. no evidence of myocardial ischemia.  Marland Kitchen History of echocardiogram 08/22/2011   Mildly hypertrophic left ventricle with normal systolic function and mild diastolic dysfunction. mildly dilated left atrium. no significant valvular abnormalities. no pericardial effusion no change from previous study 10/2010. the ejection fraction =>55%  . History of Holter monitoring 02/14/2012   Scan quality is fair due to baseline artifact. Predominant rhythm is Sinus. Veentricular Ectopy was noted in single beat form. The total recording time was 24:00  hours. total beats =[id number]. the total time analyzed was 23:58 hours. Heart  Max = 102 at 1:18:22, Min= 52 at 9:56:19 am., Avg= 70. There were 0 pause(s) > 2.5 seconds. the longest pause = 0.00 sec. at 1:51 pm(1)  . Hyperlipidemia   . Hypertension   . Neuromuscular disorder ([place])    peripheral neuropathy  . OSA (obstructive sleep apnea) 12/26/08   AHI of 6.3/hr overall, 32.7/hr during REM sleep and an RDI of 24.8/hr and 50.9/hr.  . Osteomyelitis ([place])   . Osteomyelitis ([place])    left 5th metatarsal  . PAD (peripheral artery disease) ([place])    Distal aortogram June 2012.  Atherectomy left popliteal artery July 2012.   . Sleep apnea    has not used CPAP in e years - took self off  . Slurred speech    AS PER WIFE IN D/C NOTE 11/10/10  . Substernal chest pain   . Visual color changes    LEFT EYE; BUT NOT TOTAL BLINDNESS  . Wears glasses     Social History Social History  Substance Use Topics  . Smoking status: Former Smoker    Packs/day: 0.50    Years: 35.00    Types: E-cigarettes    Quit date: 10/28/2011  . Smokeless tobacco: Never Used     Comment: VAPOR CIGARETTES  . Alcohol use 0.0 oz/week     Comment: rare    Family History Family History  Problem Relation Age of Onset  . Heart disease Father     Before age 79-  CAD, BPG  . Diabetes Father     Amputation  . Cancer Father     PROSTATE  . Hyperlipidemia Father   . Hypertension Father   . Heart attack Father     Triple BPG  . Varicose Veins Father   . Colon cancer Brother   . Cancer Brother     Colon  . Diabetes Brother   . Heart disease Brother 20    A-Fib. Before age 51  . Hyperlipidemia Brother   . Hypertension Brother   . Cancer Sister     Breast  . Hyperlipidemia Sister   . Hypertension Sister   . Hypertension Son   . Arthritis      GRANDMOTHER  . Hypertension      OTHER FAMILY MEMBERS    Past Surgical History:  Procedure Laterality Date  . AMPUTATION  11/05/2011   Procedure: AMPUTATION RAY;  Surgeon: Wylene Simmer, MD;  Location: [place];  Service: Orthopedics;  Laterality: Right;  Amputation of Right 4&5th Toes  . AMPUTATION Left 11/26/2012   Procedure: AMPUTATION RAY;  Surgeon: Wylene Simmer, MD;  Location: [place];  Service: Orthopedics;  Laterality: Left;  fourth ray amputation  . AMPUTATION Right 08/27/2014   Procedure: Transmetatarsal Amputation;  Surgeon: Newt Minion, MD;  Location: [place];  Service: Orthopedics;  Laterality: Right;  . AMPUTATION Right 01/14/2015   Procedure: AMPUTATION BELOW KNEE;  Surgeon: Newt Minion, MD;  Location: [place];  Service: Orthopedics;   Laterality: Right;  . AMPUTATION Left 10/21/2015   Procedure: Left Foot 5th Ray Amputation;  Surgeon: Newt Minion, MD;  Location: [place];  Service: Orthopedics;  Laterality: Left;  . ANTERIOR FUSION CERVICAL SPINE  02/06/06   C4-5, C5-6, C6-7; SURGEON DR. MAX COHEN  . CARDIAC CATHETERIZATION  10/31/04   2009  . CAROTID ENDARTERECTOMY  11/10/10  . CAROTID ENDARTERECTOMY Left 11/10/2010   Subtotal occlusion of left internal carotid artery  with left hemispheric transient ischemic attacks.  . CARPAL TUNNEL RELEASE Right 10/21/2013   Procedure: RIGHT CARPAL TUNNEL RELEASE;  Surgeon: Wynonia Sours, MD;  Location: [place];  Service: Orthopedics;  Laterality: Right;  . CHOLECYSTECTOMY    . COLONOSCOPY    . ESOPHAGEAL MANOMETRY Bilateral 07/19/2014   Procedure: ESOPHAGEAL MANOMETRY (EM);  Surgeon: Jerene Bears, MD;  Location: [facility];  Service: Gastroenterology;  Laterality: Bilateral;  . FEMORAL ARTERY STENT     x6  . FOOT SURGERY  04/25/2016    EXCISION BASE 5TH METATARSAL AND PARTIAL CUBOID LEFT FOOT  . HERNIA REPAIR     LEFT INGUINAL AND UMBILICAL REPAIRS  . I&D EXTREMITY Left 04/25/2016   Procedure: EXCISION BASE 5TH METATARSAL AND PARTIAL CUBOID LEFT FOOT;  Surgeon: Newt Minion, MD;  Location: [place];  Service: Orthopedics;  Laterality: Left;  . JOINT REPLACEMENT Right 2001   Total  . LAMINECTOMY     X 3 LUMBAR AND X 2 CERVICAL SPINE OPERATIONS  . LAPAROSCOPIC CHOLECYSTECTOMY W/ CHOLANGIOGRAPHY  11/09/04   SURGEON DR. Luella Cook  . LEFT HEART CATHETERIZATION WITH CORONARY ANGIOGRAM N/A 10/29/2014   Procedure: LEFT HEART CATHETERIZATION WITH CORONARY ANGIOGRAM;  Surgeon: Laverda Page, MD;  Location: [facility] C[facility] LAB;  Service: Cardiovascular;  Laterality: N/A;  . LOWER EXTREMITY ANGIOGRAM N/A 03/19/2012   Procedure: LOWER EXTREMITY ANGIOGRAM;  Surgeon: Burnell Blanks, MD;  Location: [facility] CATH LAB;  Service: Cardiovascular;  Laterality: N/A;  . NECK SURGERY    .  PENILE PROSTHESIS IMPLANT  08/14/05   INFRAPUBIC INSERTION OF INFLATABLE PENILE PROSTHESIS; SURGEON DR. Amalia Hailey  . PERCUTANEOUS CORONARY STENT INTERVENTION (PCI-S) Right 10/29/2014   Procedure: PERCUTANEOUS CORONARY STENT INTERVENTION (PCI-S);  Surgeon: Laverda Page, MD;  Location: [facility] CATH LAB;  Service: Cardiovascular;  Laterality: Right;  . SHOULDER ARTHROSCOPY    . SPINE SURGERY    . TONSILLECTOMY    . TOTAL KNEE ARTHROPLASTY  07/2002   RIGHT KNEE ; SURGEON  DR. [name] ALSO HAD ARTHROSCOPIC RIGHT KNEE IN  10/2001  . ULNAR NERVE TRANSPOSITION Right 10/21/2013   Procedure: RIGHT ELBOW  ULNAR NERVE DECOMPRESSION;  Surgeon: Wynonia Sours, MD;  Location: [place];  Service: Orthopedics;  Laterality: Right;    Allergies  Allergen Reactions  . Ivp Dye [Iodinated Diagnostic Agents] Anaphylaxis and Other (See Comments)    Breathing problems   . Adhesive [Tape] Rash    Rash after 2-3 days use    Current Outpatient Prescriptions  Medication Sig Dispense Refill  . ALPRAZolam (XANAX) 0.25 MG tablet Take 0.25 mg by mouth daily.  0  . amLODipine-olmesartan (AZOR) 5-20 MG tablet Take 1 tablet by mouth at bedtime.    Marland Kitchen aspirin EC 81 MG tablet Take 81 mg by mouth daily.    Marland Kitchen atorvastatin (LIPITOR) 40 MG tablet Take 40 mg by mouth daily.    . carvedilol (COREG) 25 MG tablet Take 25 mg by mouth daily.  0  . carvedilol (COREG) 6.25 MG tablet Take 6.25 mg by mouth at bedtime.    . clobetasol (TEMOVATE) 0.05 % external solution Apply 1 application topically daily as needed (dry scalp).    . clopidogrel (PLAVIX) 75 MG tablet Take 75 mg by mouth daily.     . clotrimazole-betamethasone (LOTRISONE) cream Apply 1 application topically daily as needed (dry skin - beard).     . collagenase (SANTYL) ointment Apply topically daily. 15 g 0  . ezetimibe (  ZETIA) 10 MG tablet Take 10 mg by mouth daily.    . fentaNYL (DURAGESIC - DOSED MCG/HR) 50 MCG/HR Place 1 patch (50 mcg total) onto the skin  every 3 (three) days. 3 patch 0  . fluticasone (CUTIVATE) 0.05 % cream Apply 1 application topically 2 (two) times daily as needed (dry skin on face).     Marland Kitchen glyBURIDE-metformin (GLUCOVANCE) 2.5-500 MG per tablet Take 2 tablets by mouth 2 (two) times daily with a meal.     . HYDROmorphone (DILAUDID) 2 MG tablet Take 1 tablet (2 mg total) by mouth at bedtime. 10 tablet 0  . insulin glargine (LANTUS) 100 UNIT/ML injection Inject 0.6 mLs (60 Units total) into the skin at bedtime. 10 mL 0  . Insulin Glulisine (APIDRA) 100 UNIT/ML Solostar Pen Inject 13 Units into the skin 3 (three) times daily with meals. 15 mL 0  . isosorbide mononitrate (IMDUR) 120 MG 24 hr tablet Take 120 mg by mouth daily.    . Linaclotide (LINZESS) 145 MCG CAPS capsule Take 1 capsule (145 mcg total) by mouth daily as needed. (Patient taking differently: Take 145 mcg by mouth daily as needed (constipation). ) 30 capsule 2  . meloxicam (MOBIC) 15 MG tablet Take 15 mg by mouth at bedtime.   0  . Multiple Vitamin (MULTIVITAMIN WITH MINERALS) TABS tablet Take 1 tablet by mouth daily.    . naproxen sodium (ALEVE) 220 MG tablet Take 220 mg by mouth 2 (two) times daily with a meal.    . nitroGLYCERIN (NITRO-BID) 2 % ointment Apply 0.75 inches topically daily as needed (esophogeal spasms).     . nitroGLYCERIN (NITROSTAT) 0.4 MG SL tablet Place 1 tablet (0.4 mg total) under the tongue every 5 (five) minutes as needed (as needed for esophageal spasm). (Patient taking differently: Place 0.4 mg under the tongue every 5 (five) minutes as needed for chest pain. ) 30 tablet 1  . olmesartan (BENICAR) 40 MG tablet Take 40 mg by mouth daily.    Marland Kitchen omega-3 acid ethyl esters (LOVAZA) 1 G capsule Take 2 g by mouth 2 (two) times daily.    . ondansetron (ZOFRAN-ODT) 4 MG disintegrating tablet Take 4 mg by mouth 4 (four) times daily as needed for nausea.    Marland Kitchen oxyCODONE-acetaminophen (PERCOCET/ROXICET) 5-325 MG tablet Take 1 tablet by mouth every 8 (eight)  hours as needed. 15 tablet 0  . pantoprazole (PROTONIX) 40 MG tablet Take 1 tablet (40 mg total) by mouth daily before breakfast. 30 tablet 3  . Polyethyl Glycol-Propyl Glycol (SYSTANE ULTRA OP) Place 1 drop into both eyes as needed (for dry eyes).    . pregabalin (LYRICA) 100 MG capsule Take 1 capsule (100 mg total) by mouth 3 (three) times daily. 30 capsule 0  . rOPINIRole (REQUIP) 2 MG tablet Take 2 mg by mouth at bedtime.     . Tamsulosin HCl (FLOMAX) 0.4 MG CAPS Take 0.4 mg by mouth daily.    Marland Kitchen torsemide (DEMADEX) 20 MG tablet Take 40 mg by mouth daily.     Marland Kitchen amoxicillin-clavulanate (AUGMENTIN) 875-125 MG tablet Take 1 tablet by mouth every 12 (twelve) hours. (Patient not taking: Reported on 07/25/2016) 42 tablet 0  . doxycycline (VIBRA-TABS) 100 MG tablet Take 1 tablet (100 mg total) by mouth every 12 (twelve) hours. (Patient not taking: Reported on 07/25/2016) 42 tablet 0  . saccharomyces boulardii (FLORASTOR) 250 MG capsule Take 1 capsule (250 mg total) by mouth 2 (two) times daily. (Patient not taking: Reported  on 07/25/2016) 60 capsule 0   No current facility-administered medications for this visit.     ROS: See HPI for pertinent positives and negatives.   Physical Examination  Vitals:   07/25/16 0850 07/25/16 0856  BP: (!) 214/87 (!) 191/86  Pulse: 75 75  Resp: 18   Temp: 98.6 F (37 C)   SpO2: 99%   Weight: 250 lb (113.4 kg)   Height: 6\' 2"  (1.88 m)    Body mass index is 32.1 kg/m.  General: WDWN obese male in NAD Gait: Using right BKA prosthesis and crutches.  HENT: WNL Eyes: Pupils equal Pulmonary: normal non-labored breathing, good air movement in all fields except diminished in right posterior base, CTAB Cardiac: RRR, no murmur detected  Abdomen: soft, NT, no palpable masses Skin: no rashes, no cellulitis.  VASCULAR EXAM  Carotid Bruits Left Right   positive Negative  radial pulses: 2+ palpable and =   VASCULAR  EXAM: Extremities without ischemic changes  without Gangrene; surgically absent left 4th and 5th toes s/p Ray amputation, Right BKA prosthesis in place. Left foot and ankle with 2+ pitting edema. Pt has his knee high compression hose with him.      LE Pulses LEFT RIGHT   FEMORAL  palpable faintly palpable    POPLITEAL not palpable  not palpable   POSTERIOR TIBIAL 2+ palpable  BKA    DORSALIS PEDIS   not palpable  BKA     Musculoskeletal: no muscle wasting or atrophy; see Extremities.  Neurologic: A&O X 3; Appropriate Affect ;  MOTOR FUNCTION: 5/5 Symmetric, CN 2-12 intact Speech is fluent/normal      ASSESSMENT: CASPIAN DELEONARDIS is a 60 y.o. male who is s/p left CEA on 11/10/2010. A vascular surgeon in [place] that placed some arterial stents in his legs was following pt's PAD; pt requested that our practice monitor his PAD. Pt has no history of stroke or TIA.  He is s/p right BKA in February 2016 by [name]. He had a left 5th ray amputation in February 2017 by [name] for an infected ulcer, per pt. The ulcer on the lateral aspect of pt's left foot has healed. He wears his left knee high compression hose. He has 6 stents in his LE's.  ESI's did not help his back issues, is considering nerve stimulator in his back; legs giving out, this apparently is attributed to his back issues. He also has DM peripheral neuropathy. He stopped tobacco use in 2013. His DM remains in good control, but pt states it has not always been in good control.  I advised pt to see his PCP or his cardiologist as soon as possible re his elevated blood pressure.   DATA 03/23/16 carotid Duplex suggests <40% stenosis of the right internal carotid artery and patent left carotid endarterectomy with mild (<40%)  hyperplasia at the distal patch site. No significant change compared to exam a year prior.  Today's ABI's: Right BKA. Left: 0.96 (normal) PT with triphasic waveforms, 0.90 AT with biphasic waveforms. TBI is normal at 0.71.   PLAN:  Based on the patient's vascular studies and examination, pt will return to clinic in 6 months with carotid duplex and ABI's.  I discussed in depth with the patient the nature of atherosclerosis, and emphasized the importance of maximal medical management including strict control of blood pressure, blood glucose, and lipid levels, obtaining regular exercise, and continued cessation of smoking.  The patient is aware that without maximal medical management the  underlying atherosclerotic disease process will progress, limiting the benefit of any interventions.  The patient was given information about PAD including signs, symptoms, treatment, what symptoms should prompt the patient to seek immediate medical care, and risk reduction measures to take.  Harold Chambers, RN, MSN, FNP-C [facility] Phone: [phone]  Clinic MD: Trula Slade  07/25/16 9:06 AM

## 2016-07-25 NOTE — Progress Notes (Signed)
Vitals:   07/25/16 0850  BP: (!) 214/87  Pulse: 75  Resp: 18  Temp: 98.6 F (37 C)  SpO2: 99%  Weight: 250 lb (113.4 kg)  Height: 6\' 2"  (1.88 m)

## 2016-07-30 ENCOUNTER — Emergency Department (HOSPITAL_COMMUNITY): Payer: BLUE CROSS/BLUE SHIELD

## 2016-07-30 ENCOUNTER — Encounter (HOSPITAL_COMMUNITY): Payer: Self-pay | Admitting: Emergency Medicine

## 2016-07-30 ENCOUNTER — Observation Stay (HOSPITAL_COMMUNITY)
Admission: EM | Admit: 2016-07-30 | Discharge: 2016-08-01 | Disposition: A | Discharge status: Home / Self Care | Payer: BLUE CROSS/BLUE SHIELD | Attending: Cardiology | Admitting: Cardiology

## 2016-07-30 DIAGNOSIS — I2510 Atherosclerotic heart disease of native coronary artery without angina pectoris: Secondary | ICD-10-CM | POA: Insufficient documentation

## 2016-07-30 DIAGNOSIS — Z955 Presence of coronary angioplasty implant and graft: Secondary | ICD-10-CM | POA: Insufficient documentation

## 2016-07-30 DIAGNOSIS — I130 Hypertensive heart and chronic kidney disease with heart failure and stage 1 through stage 4 chronic kidney disease, or unspecified chronic kidney disease: Secondary | ICD-10-CM | POA: Diagnosis not present

## 2016-07-30 DIAGNOSIS — E782 Mixed hyperlipidemia: Secondary | ICD-10-CM | POA: Insufficient documentation

## 2016-07-30 DIAGNOSIS — E1165 Type 2 diabetes mellitus with hyperglycemia: Secondary | ICD-10-CM | POA: Diagnosis not present

## 2016-07-30 DIAGNOSIS — Z794 Long term (current) use of insulin: Secondary | ICD-10-CM | POA: Insufficient documentation

## 2016-07-30 DIAGNOSIS — Z89511 Acquired absence of right leg below knee: Secondary | ICD-10-CM | POA: Insufficient documentation

## 2016-07-30 DIAGNOSIS — E1151 Type 2 diabetes mellitus with diabetic peripheral angiopathy without gangrene: Secondary | ICD-10-CM | POA: Diagnosis not present

## 2016-07-30 DIAGNOSIS — D649 Anemia, unspecified: Secondary | ICD-10-CM | POA: Diagnosis not present

## 2016-07-30 DIAGNOSIS — Z8249 Family history of ischemic heart disease and other diseases of the circulatory system: Secondary | ICD-10-CM | POA: Insufficient documentation

## 2016-07-30 DIAGNOSIS — E11621 Type 2 diabetes mellitus with foot ulcer: Secondary | ICD-10-CM | POA: Insufficient documentation

## 2016-07-30 DIAGNOSIS — K219 Gastro-esophageal reflux disease without esophagitis: Secondary | ICD-10-CM | POA: Insufficient documentation

## 2016-07-30 DIAGNOSIS — J449 Chronic obstructive pulmonary disease, unspecified: Secondary | ICD-10-CM | POA: Diagnosis not present

## 2016-07-30 DIAGNOSIS — R0789 Other chest pain: Secondary | ICD-10-CM | POA: Diagnosis not present

## 2016-07-30 DIAGNOSIS — I5032 Chronic diastolic (congestive) heart failure: Secondary | ICD-10-CM | POA: Insufficient documentation

## 2016-07-30 DIAGNOSIS — Z87891 Personal history of nicotine dependence: Secondary | ICD-10-CM | POA: Insufficient documentation

## 2016-07-30 DIAGNOSIS — Z7982 Long term (current) use of aspirin: Secondary | ICD-10-CM | POA: Diagnosis not present

## 2016-07-30 DIAGNOSIS — E1122 Type 2 diabetes mellitus with diabetic chronic kidney disease: Secondary | ICD-10-CM | POA: Diagnosis not present

## 2016-07-30 DIAGNOSIS — E11628 Type 2 diabetes mellitus with other skin complications: Secondary | ICD-10-CM | POA: Diagnosis not present

## 2016-07-30 DIAGNOSIS — Z8673 Personal history of transient ischemic attack (TIA), and cerebral infarction without residual deficits: Secondary | ICD-10-CM | POA: Insufficient documentation

## 2016-07-30 DIAGNOSIS — N183 Chronic kidney disease, stage 3 (moderate): Secondary | ICD-10-CM | POA: Diagnosis not present

## 2016-07-30 DIAGNOSIS — I159 Secondary hypertension, unspecified: Secondary | ICD-10-CM | POA: Diagnosis present

## 2016-07-30 LAB — TROPONIN I: Troponin I: <0.03 ng/mL (ref <0.03)

## 2016-07-30 LAB — CBC WITH DIFFERENTIAL/PLATELET
Basophils Absolute: 0 10*3/uL (ref 0.0–0.1)
Basophils Relative: 0 %
Eosinophils Absolute: 0.2 10*3/uL (ref 0.0–0.7)
Eosinophils Relative: 2 %
HCT: 31.3 % — ABNORMAL LOW (ref 39.0–52.0)
Hemoglobin: 10.7 g/dL — ABNORMAL LOW (ref 13.0–17.0)
Lymphocytes Relative: 24 %
Lymphs Abs: 1.6 10*3/uL (ref 0.7–4.0)
MCH: 27.1 pg (ref 26.0–34.0)
MCHC: 34.2 g/dL (ref 30.0–36.0)
MCV: 79.2 fL (ref 78.0–100.0)
Monocytes Absolute: 0.7 10*3/uL (ref 0.1–1.0)
Monocytes Relative: 10 %
Neutro Abs: 4.3 10*3/uL (ref 1.7–7.7)
Neutrophils Relative %: 64 %
Platelets: 193 10*3/uL (ref 150–400)
RBC: 3.95 MIL/uL — ABNORMAL LOW (ref 4.22–5.81)
RDW: 14.1 % (ref 11.5–15.5)
WBC: 6.8 10*3/uL (ref 4.0–10.5)

## 2016-07-30 LAB — URINALYSIS, ROUTINE W REFLEX MICROSCOPIC
Bilirubin Urine: NEGATIVE
Glucose, UA: NEGATIVE mg/dL
Ketones, ur: NEGATIVE mg/dL
Leukocytes, UA: NEGATIVE
Nitrite: NEGATIVE
Protein, ur: 100 mg/dL — AB
Specific Gravity, Urine: 1.009 (ref 1.005–1.030)
pH: 7.5 (ref 5.0–8.0)

## 2016-07-30 LAB — COMPREHENSIVE METABOLIC PANEL
ALT: 16 U/L — ABNORMAL LOW (ref 17–63)
AST: 21 U/L (ref 15–41)
Albumin: 3.7 g/dL (ref 3.5–5.0)
Alkaline Phosphatase: 104 U/L (ref 38–126)
Anion gap: 7 (ref 5–15)
BUN: 15 mg/dL (ref 6–20)
CO2: 28 mmol/L (ref 22–32)
Calcium: 9.1 mg/dL (ref 8.9–10.3)
Chloride: 106 mmol/L (ref 101–111)
Creatinine, Ser: 1.27 mg/dL — ABNORMAL HIGH (ref 0.61–1.24)
GFR calc Af Amer: >60 mL/min (ref >60)
GFR calc non Af Amer: 59 mL/min — ABNORMAL LOW (ref >60)
Glucose, Bld: 94 mg/dL (ref 65–99)
Potassium: 3.9 mmol/L (ref 3.5–5.1)
Sodium: 141 mmol/L (ref 135–145)
Total Bilirubin: 0.5 mg/dL (ref 0.3–1.2)
Total Protein: 6.2 g/dL — ABNORMAL LOW (ref 6.5–8.1)

## 2016-07-30 LAB — ETHANOL: Alcohol, Ethyl (B): <5 mg/dL (ref <5)

## 2016-07-30 LAB — RAPID URINE DRUG SCREEN, HOSP PERFORMED
Amphetamines: NOT DETECTED
Barbiturates: NOT DETECTED
Benzodiazepines: NOT DETECTED
Cocaine: NOT DETECTED
Opiates: NOT DETECTED
Tetrahydrocannabinol: NOT DETECTED

## 2016-07-30 LAB — URINE MICROSCOPIC-ADD ON
Bacteria, UA: NONE SEEN
Squamous Epithelial / LPF: NONE SEEN

## 2016-07-30 MED ORDER — ISOSORBIDE MONONITRATE ER 60 MG PO TB24
120.0000 mg | ORAL_TABLET | Freq: Every day | ORAL | Status: DC
  Administered 2016-07-31 – 2016-08-01 (×2): 120 mg via ORAL
  Filled 2016-07-30 (×2): qty 2

## 2016-07-30 MED ORDER — TAMSULOSIN HCL 0.4 MG PO CAPS
0.4000 mg | ORAL_CAPSULE | Freq: Every day | ORAL | Status: DC
  Administered 2016-07-31 – 2016-08-01 (×2): 0.4 mg via ORAL
  Filled 2016-07-30 (×2): qty 1

## 2016-07-30 MED ORDER — ASPIRIN EC 81 MG PO TBEC
81.0000 mg | DELAYED_RELEASE_TABLET | Freq: Every day | ORAL | Status: DC
  Administered 2016-07-31 – 2016-08-01 (×2): 81 mg via ORAL
  Filled 2016-07-30 (×2): qty 1

## 2016-07-30 MED ORDER — ATORVASTATIN CALCIUM 40 MG PO TABS
40.0000 mg | ORAL_TABLET | Freq: Every day | ORAL | Status: DC
  Administered 2016-07-31 – 2016-08-01 (×2): 40 mg via ORAL
  Filled 2016-07-30 (×2): qty 1

## 2016-07-30 MED ORDER — HYDRALAZINE HCL 20 MG/ML IJ SOLN
10.0000 mg | Freq: Once | INTRAMUSCULAR | Status: AC
  Administered 2016-07-30: 10 mg via INTRAVENOUS
  Filled 2016-07-30: qty 1

## 2016-07-30 MED ORDER — OMEGA-3-ACID ETHYL ESTERS 1 G PO CAPS
2.0000 g | ORAL_CAPSULE | Freq: Two times a day (BID) | ORAL | Status: DC
  Administered 2016-07-31 – 2016-08-01 (×4): 2 g via ORAL
  Filled 2016-07-30 (×4): qty 2

## 2016-07-30 MED ORDER — LABETALOL HCL 5 MG/ML IV SOLN
20.0000 mg | INTRAVENOUS | Status: DC | PRN
  Administered 2016-07-30 – 2016-07-31 (×3): 20 mg via INTRAVENOUS
  Filled 2016-07-30 (×4): qty 4

## 2016-07-30 MED ORDER — PREGABALIN 50 MG PO CAPS
100.0000 mg | ORAL_CAPSULE | Freq: Once | ORAL | Status: AC
  Administered 2016-07-30: 100 mg via ORAL
  Filled 2016-07-30: qty 2

## 2016-07-30 MED ORDER — GLYBURIDE-METFORMIN 2.5-500 MG PO TABS: 2.0000 | ORAL_TABLET | Freq: Two times a day (BID) | ORAL | Status: DC

## 2016-07-30 MED ORDER — EZETIMIBE 10 MG PO TABS
10.0000 mg | ORAL_TABLET | Freq: Every day | ORAL | Status: DC
  Administered 2016-07-31 – 2016-08-01 (×2): 10 mg via ORAL
  Filled 2016-07-30 (×2): qty 1

## 2016-07-30 MED ORDER — CLOPIDOGREL BISULFATE 75 MG PO TABS
75.0000 mg | ORAL_TABLET | Freq: Every day | ORAL | Status: DC
  Administered 2016-07-31 – 2016-08-01 (×2): 75 mg via ORAL
  Filled 2016-07-30 (×2): qty 1

## 2016-07-30 MED ORDER — TORSEMIDE 20 MG PO TABS
20.0000 mg | ORAL_TABLET | Freq: Every morning | ORAL | Status: DC
  Administered 2016-07-31 – 2016-08-01 (×2): 20 mg via ORAL
  Filled 2016-07-30 (×2): qty 1

## 2016-07-30 MED ORDER — PANTOPRAZOLE SODIUM 40 MG PO TBEC
40.0000 mg | DELAYED_RELEASE_TABLET | Freq: Every day | ORAL | Status: DC
  Administered 2016-07-31 – 2016-08-01 (×2): 40 mg via ORAL
  Filled 2016-07-30 (×2): qty 1

## 2016-07-30 MED ORDER — NITROGLYCERIN 2 % TD OINT
0.7500 [in_us] | TOPICAL_OINTMENT | Freq: Every day | TRANSDERMAL | Status: DC | PRN
  Administered 2016-07-31: 0.75 [in_us] via TOPICAL
  Filled 2016-07-30: qty 30

## 2016-07-30 MED ORDER — CARVEDILOL 25 MG PO TABS
25.0000 mg | ORAL_TABLET | Freq: Two times a day (BID) | ORAL | Status: DC
  Administered 2016-07-31 (×2): 25 mg via ORAL
  Filled 2016-07-30 (×2): qty 1

## 2016-07-30 MED ORDER — IRBESARTAN 300 MG PO TABS
300.0000 mg | ORAL_TABLET | Freq: Every day | ORAL | Status: DC
  Administered 2016-07-31 – 2016-08-01 (×2): 300 mg via ORAL
  Filled 2016-07-30 (×2): qty 1

## 2016-07-30 MED ORDER — ADULT MULTIVITAMIN W/MINERALS CH
1.0000 | ORAL_TABLET | Freq: Every day | ORAL | Status: DC
  Administered 2016-07-31 – 2016-08-01 (×2): 1 via ORAL
  Filled 2016-07-30 (×2): qty 1

## 2016-07-30 MED ORDER — OXYCODONE-ACETAMINOPHEN 5-325 MG PO TABS
1.0000 | ORAL_TABLET | Freq: Once | ORAL | Status: AC
  Administered 2016-07-30: 1 via ORAL
  Filled 2016-07-30: qty 1

## 2016-07-30 MED ORDER — METFORMIN HCL 500 MG PO TABS
500.0000 mg | ORAL_TABLET | Freq: Two times a day (BID) | ORAL | Status: DC
  Administered 2016-07-31 – 2016-08-01 (×5): 500 mg via ORAL
  Filled 2016-07-30 (×4): qty 1

## 2016-07-30 MED ORDER — AMLODIPINE-OLMESARTAN 5-20 MG PO TABS: 1.0000 | ORAL_TABLET | Freq: Every day | ORAL | Status: DC

## 2016-07-30 MED ORDER — MELOXICAM 15 MG PO TABS
15.0000 mg | ORAL_TABLET | Freq: Every day | ORAL | Status: DC
  Administered 2016-07-31: 15 mg via ORAL
  Filled 2016-07-30 (×2): qty 1

## 2016-07-30 MED ORDER — ONDANSETRON 4 MG PO TBDP
4.0000 mg | ORAL_TABLET | Freq: Four times a day (QID) | ORAL | Status: DC | PRN
Start: 2016-07-30 — End: 2016-08-01

## 2016-07-30 MED ORDER — GLYBURIDE 2.5 MG PO TABS
2.5000 mg | ORAL_TABLET | Freq: Two times a day (BID) | ORAL | Status: DC
  Administered 2016-07-31 – 2016-08-01 (×4): 2.5 mg via ORAL
  Filled 2016-07-30 (×5): qty 1

## 2016-07-30 MED ORDER — IRBESARTAN 150 MG PO TABS
150.0000 mg | ORAL_TABLET | Freq: Every day | ORAL | Status: DC
  Filled 2016-07-30: qty 1

## 2016-07-30 MED ORDER — AMLODIPINE BESYLATE 5 MG PO TABS
5.0000 mg | ORAL_TABLET | Freq: Every day | ORAL | Status: DC
  Administered 2016-07-31: 5 mg via ORAL
  Filled 2016-07-30: qty 1

## 2016-07-30 MED ORDER — FENTANYL 50 MCG/HR TD PT72
50.0000 ug | MEDICATED_PATCH | TRANSDERMAL | Status: DC
  Administered 2016-07-31: 50 ug via TRANSDERMAL
  Filled 2016-07-30: qty 1

## 2016-07-30 MED ORDER — LABETALOL HCL 5 MG/ML IV SOLN
20.0000 mg | INTRAVENOUS | Status: DC | PRN
  Administered 2016-07-31: 20 mg via INTRAVENOUS

## 2016-07-30 MED ORDER — ROPINIROLE HCL 1 MG PO TABS
2.0000 mg | ORAL_TABLET | Freq: Every day | ORAL | Status: DC
  Administered 2016-07-31: 2 mg via ORAL
  Filled 2016-07-30 (×2): qty 2

## 2016-07-30 MED ORDER — SODIUM CHLORIDE 0.9 % IV SOLN
INTRAVENOUS | Status: DC
  Administered 2016-07-30 – 2016-07-31 (×3): via INTRAVENOUS

## 2016-07-30 MED ORDER — NAPROXEN SODIUM 275 MG PO TABS
275.0000 mg | ORAL_TABLET | Freq: Two times a day (BID) | ORAL | Status: DC
  Filled 2016-07-30: qty 1

## 2016-07-30 MED ORDER — SODIUM CHLORIDE 0.9 % IV SOLN: INTRAVENOUS | Status: AC

## 2016-07-30 MED ORDER — PREGABALIN 100 MG PO CAPS
100.0000 mg | ORAL_CAPSULE | Freq: Three times a day (TID) | ORAL | Status: DC
  Administered 2016-07-31 – 2016-08-01 (×4): 100 mg via ORAL
  Filled 2016-07-30 (×4): qty 1

## 2016-07-30 MED ORDER — OXYCODONE-ACETAMINOPHEN 5-325 MG PO TABS
1.0000 | ORAL_TABLET | Freq: Three times a day (TID) | ORAL | Status: DC | PRN
  Administered 2016-07-31 – 2016-08-01 (×4): 1 via ORAL
  Filled 2016-07-30 (×4): qty 1

## 2016-07-30 NOTE — ED Provider Notes (Signed)
[facility] DEPT Provider Note   CSN: [id number] Arrival date & time: 07/30/16  1722     History   Chief Complaint Chief Complaint  Patient presents with  . Chest Pain    HPI [name] is a 60 y.o. male.  He presents for evaluation of hypertension, unresponsive to recent medication change. He has associated mild chest pressure, and general ill feeling. He also feels tired and weak. No focal weakness or paresthesia.His blood pressure has been averaging roughly 230/110. He has had pressure is systolic as high as 716, and as low as 180. He takes his blood pressure numerous times during the day. He is currently taking Benicar 40 mg a day and Coreg 25 mg twice a day. Denies headache, vomiting or back pain. He is taking his usual medications as prescribed. There are no other known modifying factors.  HPI  Past Medical History:  Diagnosis Date  . Barrett's esophagus   . Carotid artery occlusion 11/10/10   LEFT CAROTID ENDARTERECTOMY  . CHF (congestive heart failure) ([place])   . Complication of anesthesia    BP WENT UP AT [facility] "  . COPD (chronic obstructive pulmonary disease) ([place])   . Coronary artery disease   . Diabetes mellitus   . Diverticulosis of colon (without mention of hemorrhage)   . DJD (degenerative joint disease)   . Emphysema of lung ([place])   . Fatty liver   . Full dentures   . GERD (gastroesophageal reflux disease)   . H/O Doppler ultrasound 06/28/2012   Lower venous duplex left-- findings consistent with acute deep vein thrombosis involving the personal vein of the left lower extremity. No evidence of Baker's cyst on the left. Duplex scan of the left lower extremity arteries reveal adequate blood flow.   . H/O hiatal hernia   . History of cardiovascular stress test 03/23/2011   ejection fraction measured 51%, with an end-diastolic volume of 967 ml and an end-systolic volume of 81 ml. no evidence of myocardial ischemia.  Marland Kitchen History of echocardiogram 08/22/2011   Mildly hypertrophic left ventricle with normal systolic function and mild diastolic dysfunction. mildly dilated left atrium. no significant valvular abnormalities. no pericardial effusion no change from previous study 10/2010. the ejection fraction =>55%  . History of Holter monitoring 02/14/2012   Scan quality is fair due to baseline artifact. Predominant rhythm is Sinus. Veentricular Ectopy was noted in single beat form. The total recording time was 24:00 hours. total beats =[id number]. the total time analyzed was 23:58 hours. Heart  Max = 102 at 1:18:22, Min= 52 at 9:56:19 am., Avg= 70. There were 0 pause(s) > 2.5 seconds. the longest pause = 0.00 sec. at 1:51 pm(1)  . Hyperlipidemia   . Hypertension   . Neuromuscular disorder ([place])    peripheral neuropathy  . OSA (obstructive sleep apnea) 12/26/08   AHI of 6.3/hr overall, 32.7/hr during REM sleep and an RDI of 24.8/hr and 50.9/hr.  . Osteomyelitis ([place])   . Osteomyelitis ([place])    left 5th metatarsal  . PAD (peripheral artery disease) ([place])    Distal aortogram June 2012. Atherectomy left popliteal artery July 2012.   . Sleep apnea    has not used CPAP in e years - took self off  . Slurred speech    AS PER WIFE IN D/C NOTE 11/10/10  . Substernal chest pain   . Visual color changes    LEFT EYE; BUT NOT TOTAL BLINDNESS  . Wears glasses     Patient  Active Problem List   Diagnosis Date Noted  . Hypertension 07/30/2016  . History of right below knee amputation ([place]) 07/12/2016  . Poorly controlled type 2 diabetes mellitus with peripheral neuropathy ([place]) 05/09/2016  . Acute osteomyelitis of left foot ([place])   . Pressure ulcer 05/08/2016  . Diabetic foot infection ([place]) 05/04/2016  . Diabetic foot ([place]) 05/04/2016  . Osteomyelitis of ankle or foot, acute ([place]) 04/25/2016  . Type 2 diabetes mellitus with insulin therapy ([place]) 12/16/2015  . Amputated toe ([place]) 10/21/2015  . Diabetic foot ulcer ([place]) 10/12/2015  . Cellulitis of left foot  09/03/2015  . Leg edema, left 09/03/2015  . Acute renal failure ([place]) 09/03/2015  . CAD (dz of distal, mid and proximal RCA with implantation of 3 overlapping drug-eluting stent,) 09/03/2015  . Chronic diastolic heart failure, NYHA class 2 ([place]) 09/03/2015  . Opacity of lung on imaging study 01/14/2015  . Angina pectoris associated with type 2 diabetes mellitus ([place]) 10/28/2014  . Hyperlipidemia 08/25/2014  . Chronic total occlusion of artery of the extremities ([place]) 04/08/2012  . Onychomycosis 02/01/2012  . Diabetes mellitus, insulin dependent (IDDM), controlled ([place]) 11/01/2011  . Occlusion and stenosis of carotid artery without mention of cerebral infarction 06/12/2011  . Aftercare following surgery of the circulatory system, [place] 06/12/2011  . GERD (gastroesophageal reflux disease) 05/08/2011  . Barrett's esophagus without dysplasia 05/08/2011  . PVD (peripheral vascular disease) ([place]) 02/01/2011  . HTN (hypertension) 02/01/2011  . Former tobacco use 02/01/2011    Past Surgical History:  Procedure Laterality Date  . AMPUTATION  11/05/2011   Procedure: AMPUTATION RAY;  Surgeon: Wylene Simmer, MD;  Location: [facility];  Service: Orthopedics;  Laterality: Right;  Amputation of Right 4&5th Toes  . AMPUTATION Left 11/26/2012   Procedure: AMPUTATION RAY;  Surgeon: Wylene Simmer, MD;  Location: [facility];  Service: Orthopedics;  Laterality: Left;  fourth ray amputation  . AMPUTATION Right 08/27/2014   Procedure: Transmetatarsal Amputation;  Surgeon: Newt Minion, MD;  Location: [facility];  Service: Orthopedics;  Laterality: Right;  . AMPUTATION Right 01/14/2015   Procedure: AMPUTATION BELOW KNEE;  Surgeon: Newt Minion, MD;  Location: [facility];  Service: Orthopedics;  Laterality: Right;  . AMPUTATION Left 10/21/2015   Procedure: Left Foot 5th Ray Amputation;  Surgeon: Newt Minion, MD;  Location: [facility];  Service: Orthopedics;  Laterality: Left;  . ANTERIOR FUSION CERVICAL SPINE  02/06/06   C4-5, C5-6, C6-7;  SURGEON DR. MAX COHEN  . CARDIAC CATHETERIZATION  10/31/04   2009  . CAROTID ENDARTERECTOMY  11/10/10  . CAROTID ENDARTERECTOMY Left 11/10/2010   Subtotal occlusion of left internal carotid artery with left hemispheric transient ischemic attacks.  . CARPAL TUNNEL RELEASE Right 10/21/2013   Procedure: RIGHT CARPAL TUNNEL RELEASE;  Surgeon: Wynonia Sours, MD;  Location: [facility];  Service: Orthopedics;  Laterality: Right;  . CHOLECYSTECTOMY    . COLONOSCOPY    . ESOPHAGEAL MANOMETRY Bilateral 07/19/2014   Procedure: ESOPHAGEAL MANOMETRY (EM);  Surgeon: Jerene Bears, MD;  Location: [facility];  Service: Gastroenterology;  Laterality: Bilateral;  . FEMORAL ARTERY STENT     x6  . FOOT SURGERY  04/25/2016    EXCISION BASE 5TH METATARSAL AND PARTIAL CUBOID LEFT FOOT  . HERNIA REPAIR     LEFT INGUINAL AND UMBILICAL REPAIRS  . I&D EXTREMITY Left 04/25/2016   Procedure: EXCISION BASE 5TH METATARSAL AND PARTIAL CUBOID LEFT FOOT;  Surgeon: Newt Minion, MD;  Location: [facility];  Service: Orthopedics;  Laterality: Left;  . JOINT REPLACEMENT Right 2001   Total  . LAMINECTOMY     X 3 LUMBAR AND X 2 CERVICAL SPINE OPERATIONS  . LAPAROSCOPIC CHOLECYSTECTOMY W/ CHOLANGIOGRAPHY  11/09/04   SURGEON DR. Luella Cook  . LEFT HEART CATHETERIZATION WITH CORONARY ANGIOGRAM N/A 10/29/2014   Procedure: LEFT HEART CATHETERIZATION WITH CORONARY ANGIOGRAM;  Surgeon: Laverda Page, MD;  Location[facility] CATH LAB;  Service: Cardiovascular;  Laterality: N/A;  . LOWER EXTREMITY ANGIOGRAM N/A 03/19/2012   Procedure: LOWER EXTREMITY ANGIOGRAM;  Surgeon: Burnell Blanks, MD;  Location: [facility] CATH LAB;  Service: Cardiovascular;  Laterality: N/A;  . NECK SURGERY    . PENILE PROSTHESIS IMPLANT  08/14/05   INFRAPUBIC INSERTION OF INFLATABLE PENILE PROSTHESIS; SURGEON DR. Amalia Hailey  . PERCUTANEOUS CORONARY STENT INTERVENTION (PCI-S) Right 10/29/2014   Procedure: PERCUTANEOUS CORONARY STENT INTERVENTION (PCI-S);  Surgeon:  Laverda Page, MD;  Location: [facility] C[facility] LAB;  Service: Cardiovascular;  Laterality: Right;  . SHOULDER ARTHROSCOPY    . SPINE SURGERY    . TONSILLECTOMY    . TOTAL KNEE ARTHROPLASTY  07/2002   RIGHT KNEE ; SURGEON  DR. [name] ALSO HAD ARTHROSCOPIC RIGHT KNEE IN  10/2001  . ULNAR NERVE TRANSPOSITION Right 10/21/2013   Procedure: RIGHT ELBOW  ULNAR NERVE DECOMPRESSION;  Surgeon: Wynonia Sours, MD;  Location: [facility];  Service: Orthopedics;  Laterality: Right;       Home Medications    Prior to Admission medications   Medication Sig Start Date End Date Taking? Authorizing Provider  ALPRAZolam (XANAX) 0.25 MG tablet Take 0.25 mg by mouth daily. 08/22/15  Yes Historical Provider, MD  amLODipine-olmesartan (AZOR) 5-20 MG tablet Take 1 tablet by mouth at bedtime.   Yes Historical Provider, MD  aspirin EC 81 MG tablet Take 81 mg by mouth daily.   Yes Historical Provider, MD  atorvastatin (LIPITOR) 40 MG tablet Take 40 mg by mouth daily.   Yes Historical Provider, MD  carvedilol (COREG) 25 MG tablet Take 25 mg by mouth 2 (two) times daily.  08/18/15  Yes Historical Provider, MD  clobetasol (TEMOVATE) 0.05 % external solution Apply 1 application topically daily as needed (dry scalp).   Yes Historical Provider, MD  clopidogrel (PLAVIX) 75 MG tablet Take 75 mg by mouth daily.  03/28/12  Yes Historical Provider, MD  clotrimazole-betamethasone (LOTRISONE) cream Apply 1 application topically daily as needed (dry skin - beard).    Yes Historical Provider, MD  collagenase (SANTYL) ointment Apply topically daily. Patient taking differently: Apply 1 application topically daily. TO AFFECTED SITE(S) 09/06/15  Yes Lavina Hamman, MD  ezetimibe (ZETIA) 10 MG tablet Take 10 mg by mouth daily.   Yes Historical Provider, MD  fentaNYL (DURAGESIC - DOSED MCG/HR) 50 MCG/HR Place 1 patch (50 mcg total) onto the skin every 3 (three) days. 05/10/16  Yes Orson Eva, MD  fluticasone (CUTIVATE) 0.05 % cream  Apply 1 application topically 2 (two) times daily as needed (dry skin on face).    Yes Historical Provider, MD  glyBURIDE-metformin (GLUCOVANCE) 2.5-500 MG per tablet Take 2 tablets by mouth 2 (two) times daily with a meal.  03/05/12  Yes Historical Provider, MD  HYDROmorphone (DILAUDID) 2 MG tablet Take 1 tablet (2 mg total) by mouth at bedtime. Patient taking differently: Take 2 mg by mouth at bedtime as needed (for breakthrough pain).  05/10/16  Yes David Tat, MD  insulin glargine (LANTUS) 100 UNIT/ML injection Inject 0.6  mLs (60 Units total) into the skin at bedtime. Patient taking differently: Inject 50 Units into the skin at bedtime.  05/11/16  Yes Orson Eva, MD  Insulin Glulisine (APIDRA) 100 UNIT/ML Solostar Pen Inject 13 Units into the skin 3 (three) times daily with meals. Patient taking differently: Inject 10 Units into the skin 3 (three) times daily before meals.  05/11/16  Yes Orson Eva, MD  isosorbide mononitrate (IMDUR) 120 MG 24 hr tablet Take 120 mg by mouth daily.   Yes Historical Provider, MD  Linaclotide Rolan Lipa) 145 MCG CAPS capsule Take 1 capsule (145 mcg total) by mouth daily as needed. Patient taking differently: Take 145 mcg by mouth daily as needed (constipation).  01/20/15  Yes Florencia Reasons, MD  meloxicam (MOBIC) 15 MG tablet Take 15 mg by mouth at bedtime.  07/12/15  Yes Historical Provider, MD  Multiple Vitamin (MULTIVITAMIN WITH MINERALS) TABS tablet Take 1 tablet by mouth daily.   Yes Historical Provider, MD  naproxen sodium (ALEVE) 220 MG tablet Take 220 mg by mouth 2 (two) times daily with a meal.   Yes Historical Provider, MD  nitroGLYCERIN (NITRO-BID) 2 % ointment Apply 0.75 inches topically daily as needed for chest pain.    Yes Historical Provider, MD  nitroGLYCERIN (NITROSTAT) 0.4 MG SL tablet Place 1 tablet (0.4 mg total) under the tongue every 5 (five) minutes as needed (as needed for esophageal spasm). Patient taking differently: Place 0.4 mg under the tongue every 5  (five) minutes as needed for chest pain.  08/09/14  Yes Jerene Bears, MD  olmesartan (BENICAR) 40 MG tablet Take 40 mg by mouth every morning.    Yes Historical Provider, MD  omega-3 acid ethyl esters (LOVAZA) 1 G capsule Take 2 g by mouth 2 (two) times daily.   Yes Historical Provider, MD  ondansetron (ZOFRAN-ODT) 4 MG disintegrating tablet Take 4 mg by mouth 4 (four) times daily as needed for nausea.   Yes Historical Provider, MD  oxyCODONE-acetaminophen (PERCOCET/ROXICET) 5-325 MG tablet Take 1 tablet by mouth every 8 (eight) hours as needed. Patient taking differently: Take 1 tablet by mouth 3 (three) times daily.  05/10/16  Yes Orson Eva, MD  pantoprazole (PROTONIX) 40 MG tablet Take 1 tablet (40 mg total) by mouth daily before breakfast. 10/30/14  Yes Adrian Prows, MD  pregabalin (LYRICA) 100 MG capsule Take 1 capsule (100 mg total) by mouth 3 (three) times daily. 05/10/16  Yes Orson Eva, MD  Probiotic CAPS Take 2 capsules by mouth daily after breakfast.   Yes Historical Provider, MD  Propylene Glycol (SYSTANE BALANCE OP) Place 2-3 drops into both eyes daily as needed (for dry eyes).   Yes Historical Provider, MD  rOPINIRole (REQUIP) 2 MG tablet Take 2 mg by mouth at bedtime.    Yes Historical Provider, MD  Tamsulosin HCl (FLOMAX) 0.4 MG CAPS Take 0.4 mg by mouth daily.   Yes Historical Provider, MD  torsemide (DEMADEX) 20 MG tablet Take 20 mg by mouth every morning. MAY TAKE AN EXTRA 20 MG ONCE A DAY FOR EXCESSIVE FLUID RETENTION   Yes Historical Provider, MD  amoxicillin-clavulanate (AUGMENTIN) 875-125 MG tablet Take 1 tablet by mouth every 12 (twelve) hours. Patient not taking: Reported on 07/30/2016 05/11/16   Orson Eva, MD  doxycycline (VIBRA-TABS) 100 MG tablet Take 1 tablet (100 mg total) by mouth every 12 (twelve) hours. Patient not taking: Reported on 07/30/2016 05/11/16   Orson Eva, MD  saccharomyces boulardii (FLORASTOR) 250 MG capsule Take 1  capsule (250 mg total) by mouth 2 (two) times  daily. Patient not taking: Reported on 07/30/2016 05/10/16   Orson Eva, MD    Family History Family History  Problem Relation Age of Onset  . Heart disease Father     Before age 71-  CAD, BPG  . Diabetes Father     Amputation  . Cancer Father     PROSTATE  . Hyperlipidemia Father   . Hypertension Father   . Heart attack Father     Triple BPG  . Varicose Veins Father   . Colon cancer Brother   . Cancer Brother     Colon  . Diabetes Brother   . Heart disease Brother 18    A-Fib. Before age 58  . Hyperlipidemia Brother   . Hypertension Brother   . Cancer Sister     Breast  . Hyperlipidemia Sister   . Hypertension Sister   . Hypertension Son   . Arthritis      GRANDMOTHER  . Hypertension      OTHER FAMILY MEMBERS    Social History Social History  Substance Use Topics  . Smoking status: Former Smoker    Packs/day: 0.50    Years: 35.00    Types: E-cigarettes    Quit date: 10/28/2011  . Smokeless tobacco: Never Used     Comment: VAPOR CIGARETTES  . Alcohol use 0.0 oz/week     Comment: rare     Allergies   Ivp dye [iodinated diagnostic agents] and Adhesive [tape]   Review of Systems Review of Systems  All other systems reviewed and are negative.    Physical Exam Updated Vital Signs BP 175/70   Pulse 72   Temp 98.9 F (37.2 C) (Oral)   Resp 17   SpO2 97%   Physical Exam  Constitutional: He is oriented to person, place, and time. He appears well-developed and well-nourished.  HENT:  Head: Normocephalic and atraumatic.  Right Ear: External ear normal.  Left Ear: External ear normal.  Eyes: Conjunctivae and EOM are normal. Pupils are equal, round, and reactive to light.  Neck: Normal range of motion and phonation normal. Neck supple.  Cardiovascular: Normal rate, regular rhythm and normal heart sounds.   Pulmonary/Chest: Effort normal and breath sounds normal. No respiratory distress. He exhibits no bony tenderness.  Abdominal: Soft. There is no  tenderness.  Musculoskeletal: Normal range of motion.  Right leg BKA. Left leg edema, 1-2+.  Neurological: He is alert and oriented to person, place, and time. No cranial nerve deficit or sensory deficit. He exhibits normal muscle tone. Coordination normal.  Skin: Skin is warm, dry and intact.  Psychiatric: He has a normal mood and affect. His behavior is normal. Judgment and thought content normal.  Nursing note and vitals reviewed.    ED Treatments / Results  Labs (all labs ordered are listed, but only abnormal results are displayed) Labs Reviewed  COMPREHENSIVE METABOLIC PANEL - Abnormal; Notable for the following:       Result Value   Creatinine, Ser 1.27 (*)    Total Protein 6.2 (*)    ALT 16 (*)    GFR calc non Af Amer 59 (*)    All other components within normal limits  CBC WITH DIFFERENTIAL/PLATELET - Abnormal; Notable for the following:    RBC 3.95 (*)    Hemoglobin 10.7 (*)    HCT 31.3 (*)    All other components within normal limits  URINALYSIS, ROUTINE W REFLEX MICROSCOPIC (NOT  AT [facility]) - Abnormal; Notable for the following:    Hgb urine dipstick TRACE (*)    Protein, ur 100 (*)    All other components within normal limits  TROPONIN I  ETHANOL  RAPID URINE DRUG SCREEN, HOSP PERFORMED  URINE MICROSCOPIC-ADD ON    EKG  EKG Interpretation  Date/Time:  Monday July 30 2016 17:30:05 EST Ventricular Rate:  83 PR Interval:    QRS Duration: 139 QT Interval:  428 QTC Calculation: 503 R Axis:   -66 Text Interpretation:  Sinus rhythm Probable left atrial enlargement Right bundle branch block LVH with IVCD and secondary repol abnrm Prolonged QT interval Since last tracing QT has lengthened Confirmed by Eulis Foster  MD, Aloni Chuang [phone]) on 07/30/2016 5:46:30 PM       Radiology Dg Chest 2 View  Result Date: 07/30/2016 CLINICAL DATA:  Hypertensive, LEFT chest pain and shortness of breath for 3 weeks. History of COPD. EXAM: CHEST  2 VIEW COMPARISON:  Chest radiograph  April 28, 2016 FINDINGS: Cardiomediastinal silhouette is unremarkable for this low inspiratory examination with crowded vasculature markings. The lungs are clear without pleural effusions or focal consolidations. Trachea projects midline and there is no pneumothorax. Included soft tissue planes and osseous structures are non-suspicious. Cervical spine hardware. Moderate degenerative change of the thoracic spine. IMPRESSION: No acute cardiopulmonary process for this low inspiratory examination. Electronically Signed   By: Elon Alas M.D.   On: 07/30/2016 18:33    Procedures Procedures (including critical care time)  Medications Ordered in ED Medications  0.9 %  sodium chloride infusion ( Intravenous New Bag/Given 07/30/16 1833)  labetalol (NORMODYNE,TRANDATE) injection 20 mg (20 mg Intravenous Given 07/30/16 2046)  amLODipine-olmesartan (AZOR) 5-20 MG per tablet 1 tablet (not administered)  aspirin EC tablet 81 mg (not administered)  atorvastatin (LIPITOR) tablet 40 mg (not administered)  carvedilol (COREG) tablet 25 mg (not administered)  clopidogrel (PLAVIX) tablet 75 mg (not administered)  fentaNYL (DURAGESIC - dosed mcg/hr) patch 50 mcg (not administered)  ezetimibe (ZETIA) tablet 10 mg (not administered)  glyBURIDE-metformin (GLUCOVANCE) 2.5-500 MG per tablet 2 tablet (not administered)  isosorbide mononitrate (IMDUR) 24 hr tablet 120 mg (not administered)  meloxicam (MOBIC) tablet 15 mg (not administered)  multivitamin with minerals tablet 1 tablet (not administered)  naproxen sodium (ANAPROX) tablet 220 mg (not administered)  nitroGLYCERIN (NITROGLYN) 2 % ointment 0.75 inch (not administered)  irbesartan (AVAPRO) tablet 300 mg (not administered)  omega-3 acid ethyl esters (LOVAZA) capsule 2 g (not administered)  ondansetron (ZOFRAN-ODT) disintegrating tablet 4 mg (not administered)  oxyCODONE-acetaminophen (PERCOCET/ROXICET) 5-325 MG per tablet 1 tablet (not administered)    pantoprazole (PROTONIX) EC tablet 40 mg (not administered)  pregabalin (LYRICA) capsule 100 mg (not administered)  rOPINIRole (REQUIP) tablet 2 mg (not administered)  tamsulosin (FLOMAX) capsule 0.4 mg (not administered)  torsemide (DEMADEX) tablet 20 mg (not administered)  hydrALAZINE (APRESOLINE) injection 10 mg (10 mg Intravenous Given 07/30/16 1837)  oxyCODONE-acetaminophen (PERCOCET/ROXICET) 5-325 MG per tablet 1 tablet (1 tablet Oral Given 07/30/16 2141)  pregabalin (LYRICA) capsule 100 mg (100 mg Oral Given 07/30/16 2140)     Initial Impression / Assessment and Plan / ED Course  I have reviewed the triage vital signs and the nursing notes.  Pertinent labs & imaging results that were available during my care of the patient were reviewed by me and considered in my medical decision making (see chart for details).  Clinical Course     Medications  0.9 %  sodium chloride infusion (  Intravenous New Bag/Given 07/30/16 1833)  labetalol (NORMODYNE,TRANDATE) injection 20 mg (20 mg Intravenous Given 07/30/16 2046)  amLODipine-olmesartan (AZOR) 5-20 MG per tablet 1 tablet (not administered)  aspirin EC tablet 81 mg (not administered)  atorvastatin (LIPITOR) tablet 40 mg (not administered)  carvedilol (COREG) tablet 25 mg (not administered)  clopidogrel (PLAVIX) tablet 75 mg (not administered)  fentaNYL (DURAGESIC - dosed mcg/hr) patch 50 mcg (not administered)  ezetimibe (ZETIA) tablet 10 mg (not administered)  glyBURIDE-metformin (GLUCOVANCE) 2.5-500 MG per tablet 2 tablet (not administered)  isosorbide mononitrate (IMDUR) 24 hr tablet 120 mg (not administered)  meloxicam (MOBIC) tablet 15 mg (not administered)  multivitamin with minerals tablet 1 tablet (not administered)  naproxen sodium (ANAPROX) tablet 220 mg (not administered)  nitroGLYCERIN (NITROGLYN) 2 % ointment 0.75 inch (not administered)  irbesartan (AVAPRO) tablet 300 mg (not administered)  omega-3 acid ethyl esters  (LOVAZA) capsule 2 g (not administered)  ondansetron (ZOFRAN-ODT) disintegrating tablet 4 mg (not administered)  oxyCODONE-acetaminophen (PERCOCET/ROXICET) 5-325 MG per tablet 1 tablet (not administered)  pantoprazole (PROTONIX) EC tablet 40 mg (not administered)  pregabalin (LYRICA) capsule 100 mg (not administered)  rOPINIRole (REQUIP) tablet 2 mg (not administered)  tamsulosin (FLOMAX) capsule 0.4 mg (not administered)  torsemide (DEMADEX) tablet 20 mg (not administered)  hydrALAZINE (APRESOLINE) injection 10 mg (10 mg Intravenous Given 07/30/16 1837)  oxyCODONE-acetaminophen (PERCOCET/ROXICET) 5-325 MG per tablet 1 tablet (1 tablet Oral Given 07/30/16 2141)  pregabalin (LYRICA) capsule 100 mg (100 mg Oral Given 07/30/16 2140)    Patient Vitals for the past 24 hrs:  BP Temp Temp src Pulse Resp SpO2  07/30/16 2200 175/70 - - 72 17 97 %  07/30/16 2145 - - - 81 21 99 %  07/30/16 2130 167/69 - - 71 16 98 %  07/30/16 2100 163/68 - - 74 17 97 %  07/30/16 2052 172/69 - - 80 16 97 %  07/30/16 2035 193/72 - - 85 23 99 %  07/30/16 2030 (!) 202/78 - - 87 16 99 %  07/30/16 2000 187/89 - - 81 18 98 %  07/30/16 1930 189/89 - - 70 21 100 %  07/30/16 1915 169/81 - - 68 16 99 %  07/30/16 1900 170/83 - - 72 17 98 %  07/30/16 1845 180/87 - - 70 14 99 %  07/30/16 1834 186/88 - - 68 16 98 %  07/30/16 1830 192/96 - - 73 14 98 %  07/30/16 1745 (!) 249/112 - - 91 21 100 %  07/30/16 1735 - 98.9 F (37.2 C) Oral 79 12 98 %  07/30/16 1729 - - - - - 99 %    17:30- case discussed with his cardiologist, [name]; who recommends giving a dose of labetalol plus hydralazine, then reassess. He believes that the patient may need to be admitted, and is willing to do that.  8:10 PM Reevaluation with update and discussion. After initial assessment and treatment, an updated evaluation reveals He remains comfortable. Blood pressure improved to 169/81 with a single dose of labetalol and hydralazine, then gradually  increase to 187/89, at 20:00. Will repeat his cardiologist, to discuss the case. Absalom Aro L   20:15- requested page to his cardiologist, to discuss disposition.  Final Clinical Impressions(s) / ED Diagnoses   Final diagnoses:  Secondary hypertension   Hypertension, persistent, without end organ damage. Doubt ACS or hypertensive urgency.  Patient admitted for management and close observation, by his cardiologist  Nursing Notes Reviewed/ Care Coordinated, and agree without changes. Applicable Imaging  Reviewed.  Interpretation of Laboratory Data incorporated into ED treatment      New Prescriptions New Prescriptions   No medications on file     Daleen Bo, MD 07/30/16 2219

## 2016-07-30 NOTE — ED Notes (Signed)
Patient transported to X-ray 

## 2016-07-30 NOTE — ED Notes (Signed)
Pt states he did not bring his leg it is being remade

## 2016-07-30 NOTE — ED Triage Notes (Signed)
Per EMS: Hasn't been feeling well for 3 weeks. Off and tightness in chest, HA, blurry vision, and generalized weakness, w/ episodes of nausea. Last Friday saw Cardiologist about symptoms, pt was hypertensive. Switched meds from [place]. Pt c/o HA and blurry vision. Nitro patch and fentanyl patch today. Pt has RBBB on EMS ECG. 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   Pt c/o chest tightness 4/10 at this time, blurry vision, and HA. No SOB at this time.

## 2016-07-30 NOTE — ED Notes (Signed)
Provider at bedside

## 2016-07-30 NOTE — Addendum Note (Signed)
Addended by: Mena Goes on: 07/30/2016 02:18 PM   Modules accepted: Orders

## 2016-07-31 ENCOUNTER — Encounter (HOSPITAL_COMMUNITY): Payer: Self-pay | Admitting: *Deleted

## 2016-07-31 DIAGNOSIS — I130 Hypertensive heart and chronic kidney disease with heart failure and stage 1 through stage 4 chronic kidney disease, or unspecified chronic kidney disease: Secondary | ICD-10-CM | POA: Diagnosis not present

## 2016-07-31 DIAGNOSIS — E11621 Type 2 diabetes mellitus with foot ulcer: Secondary | ICD-10-CM | POA: Diagnosis not present

## 2016-07-31 DIAGNOSIS — E1122 Type 2 diabetes mellitus with diabetic chronic kidney disease: Secondary | ICD-10-CM | POA: Diagnosis not present

## 2016-07-31 DIAGNOSIS — R0789 Other chest pain: Secondary | ICD-10-CM | POA: Diagnosis not present

## 2016-07-31 LAB — GLUCOSE, CAPILLARY
Glucose-Capillary: 190 mg/dL — ABNORMAL HIGH (ref 65–99)
Glucose-Capillary: 192 mg/dL — ABNORMAL HIGH (ref 65–99)
Glucose-Capillary: 203 mg/dL — ABNORMAL HIGH (ref 65–99)
Glucose-Capillary: 244 mg/dL — ABNORMAL HIGH (ref 65–99)
Glucose-Capillary: 84 mg/dL (ref 65–99)

## 2016-07-31 MED ORDER — HYDRALAZINE HCL 50 MG PO TABS
50.0000 mg | ORAL_TABLET | Freq: Three times a day (TID) | ORAL | Status: DC
  Administered 2016-07-31 – 2016-08-01 (×5): 50 mg via ORAL
  Filled 2016-07-31 (×5): qty 1

## 2016-07-31 MED ORDER — CARVEDILOL 25 MG PO TABS
37.5000 mg | ORAL_TABLET | Freq: Two times a day (BID) | ORAL | Status: DC
  Administered 2016-07-31 – 2016-08-01 (×2): 37.5 mg via ORAL
  Filled 2016-07-31 (×2): qty 1

## 2016-07-31 MED ORDER — LINACLOTIDE 145 MCG PO CAPS
145.0000 ug | ORAL_CAPSULE | Freq: Every day | ORAL | Status: DC | PRN
  Filled 2016-07-31: qty 1

## 2016-07-31 MED ORDER — INSULIN ASPART 100 UNIT/ML ~~LOC~~ SOLN
10.0000 [IU] | Freq: Three times a day (TID) | SUBCUTANEOUS | Status: DC
  Administered 2016-08-01: 10 [IU] via SUBCUTANEOUS

## 2016-07-31 MED ORDER — HYDROMORPHONE HCL 1 MG/ML IJ SOLN
0.5000 mg | INTRAMUSCULAR | Status: DC | PRN
  Administered 2016-07-31: 0.5 mg via INTRAVENOUS
  Filled 2016-07-31: qty 1

## 2016-07-31 MED ORDER — INSULIN GLARGINE 100 UNIT/ML ~~LOC~~ SOLN
50.0000 [IU] | Freq: Every day | SUBCUTANEOUS | Status: DC
  Administered 2016-07-31: 50 [IU] via SUBCUTANEOUS
  Filled 2016-07-31 (×2): qty 0.5

## 2016-07-31 MED ORDER — INSULIN GLULISINE 100 UNIT/ML SOLOSTAR PEN: 10.0000 [IU] | PEN_INJECTOR | Freq: Three times a day (TID) | SUBCUTANEOUS | Status: DC

## 2016-07-31 MED ORDER — ALPRAZOLAM 0.25 MG PO TABS
0.2500 mg | ORAL_TABLET | Freq: Every day | ORAL | Status: DC
  Administered 2016-08-01: 0.25 mg via ORAL
  Filled 2016-07-31: qty 1

## 2016-07-31 MED ORDER — INSULIN ASPART 100 UNIT/ML ~~LOC~~ SOLN: 0.0000 [IU] | Freq: Three times a day (TID) | SUBCUTANEOUS | Status: DC

## 2016-07-31 MED ORDER — CLONIDINE HCL 0.2 MG/24HR TD PTWK
0.2000 mg | MEDICATED_PATCH | TRANSDERMAL | Status: DC
  Administered 2016-07-31: 0.2 mg via TRANSDERMAL
  Filled 2016-07-31: qty 1

## 2016-07-31 MED ORDER — INSULIN ASPART 100 UNIT/ML ~~LOC~~ SOLN: 0.0000 [IU] | Freq: Every day | SUBCUTANEOUS | Status: DC

## 2016-07-31 MED ORDER — AMLODIPINE BESYLATE 10 MG PO TABS
10.0000 mg | ORAL_TABLET | Freq: Every day | ORAL | Status: DC
  Administered 2016-07-31: 10 mg via ORAL
  Filled 2016-07-31: qty 1

## 2016-07-31 NOTE — H&P (Signed)
[name] is an 60 y.o. male.   Chief Complaint: Accelerated HTN HPI: [name]  is a 60 y.o. male with a h/o COPD, degenerative joint disease and spine disease, uncontrolled diabetes mellitus, Barrett's esophagus, sleep apnea, HTN, known CAD, s/p angioplastyto the right coronary artery on 10/29/2014, and carotid artery disease s/p carotid endarterectomy on 11/10/2010. He also has severe PAD, diabetic foot ulcer with multiple admission to the hospital for osteomyelitis of his toes, has had toe amputations in both the lower extremities, small vessel angioplasty by Dr. Brunetta Jeans in [place], [place]. He underwent right foot amputations on 01/14/2015 due to diabetic ulcer and osteomyelitis.   He was previously on Benicar/HCT for HTN which was temporarily held due to ARF during a previous hospitalization for osteomyelitis. Blood pressure had remained controlled and this was never resumed. He called the office on 07/24/2016 stating that blood pressure had been elevated in the 200s/100s. Coreg dose was increased and Benicar 26m was resumed. He was scheduled for repeat BMP today to reevaluate renal function and office visit to recheck BP. He called the office on the afternoon of 07/30/2016 stating that BP was still elevated. He was advised to resume Hydralazine, however, he refused as he stated this previously caused lower extremity edema. He had already placed a nitroglycerin patch and was advised to continue using this until he was reevaluated in our office this morning.   He began developing chest tightness yesterday evening and presented to the ED. Initial BP 249/112. BP improved to 192/96 with dose of labetalol and hydralazine. He is now being admitted for management of hypertension and evaluation for secondary causes of accelerated HTN.  Since admission to the hospital, he has started to feel better.  Blood pressure also has improved.  He denies any significant change in his lifestyle.  No  neurologic deficits, denies any symptoms of claudication.  He has not had any associated nausea, vomiting or diaphoresis with the chest pain.  Chest pain is different from his prior angina pectoris.  Past Medical History:  Diagnosis Date  . Barrett's esophagus   . Carotid artery occlusion 11/10/10   LEFT CAROTID ENDARTERECTOMY  . CHF (congestive heart failure) ([facility] [place]   . Complication of anesthesia    BP WENT UP AT [facility] "  . COPD (chronic obstructive pulmonary disease) ([facility]   . Coronary artery disease   . Diabetes mellitus   . Diverticulosis of colon (without mention of hemorrhage)   . DJD (degenerative joint disease)   . Emphysema of lung ([place]   . Fatty liver   . Full dentures   . GERD (gastroesophageal reflux disease)   . H/O Doppler ultrasound 06/28/2012   Lower venous duplex left-- findings consistent with acute deep vein thrombosis involving the personal vein of the left lower extremity. No evidence of Baker's cyst on the left. Duplex scan of the left lower extremity arteries reveal adequate blood flow.   . H/O hiatal hernia   . History of cardiovascular stress test 03/23/2011   ejection fraction measured 51%, with an end-diastolic volume of 1333ml and an end-systolic volume of 81 ml. no evidence of myocardial ischemia.  .Marland KitchenHistory of echocardiogram 08/22/2011   Mildly hypertrophic left ventricle with normal systolic function and mild diastolic dysfunction. mildly dilated left atrium. no significant valvular abnormalities. no pericardial effusion no change from previous study 10/2010. the ejection fraction =>55%  . History of Holter monitoring 02/14/2012   Scan quality is fair due to baseline  artifact. Predominant rhythm is Sinus. Veentricular Ectopy was noted in single beat form. The total recording time was 24:00 hours. total beats =[id number]. the total time analyzed was 23:58 hours. Heart  Max = 102 at 1:18:22, Min= 52 at 9:56:19 am., Avg= 70. There were 0 pause(s) > 2.5 seconds. the longest  pause = 0.00 sec. at 1:51 pm(1)  . Hyperlipidemia   . Hypertension   . Neuromuscular disorder ([place])    peripheral neuropathy  . OSA (obstructive sleep apnea) 12/26/08   AHI of 6.3/hr overall, 32.7/hr during REM sleep and an RDI of 24.8/hr and 50.9/hr.  . Osteomyelitis ([place])   . Osteomyelitis ([place])    left 5th metatarsal  . PAD (peripheral artery disease) ([place])    Distal aortogram June 2012. Atherectomy left popliteal artery July 2012.   . Sleep apnea    has not used CPAP in e years - took self off  . Slurred speech    AS PER WIFE IN D/C NOTE 11/10/10  . Substernal chest pain   . Visual color changes    LEFT EYE; BUT NOT TOTAL BLINDNESS  . Wears glasses     Past Surgical History:  Procedure Laterality Date  . AMPUTATION  11/05/2011   Procedure: AMPUTATION RAY;  Surgeon: Wylene Simmer, MD;  Location: [facility];  Service: Orthopedics;  Laterality: Right;  Amputation of Right 4&5th Toes  . AMPUTATION Left 11/26/2012   Procedure: AMPUTATION RAY;  Surgeon: Wylene Simmer, MD;  Location: [facility];  Service: Orthopedics;  Laterality: Left;  fourth ray amputation  . AMPUTATION Right 08/27/2014   Procedure: Transmetatarsal Amputation;  Surgeon: Newt Minion, MD;  Location: [facility];  Service: Orthopedics;  Laterality: Right;  . AMPUTATION Right 01/14/2015   Procedure: AMPUTATION BELOW KNEE;  Surgeon: Newt Minion, MD;  Location: [facility];  Service: Orthopedics;  Laterality: Right;  . AMPUTATION Left 10/21/2015   Procedure: Left Foot 5th Ray Amputation;  Surgeon: Newt Minion, MD;  Location: [facility];  Service: Orthopedics;  Laterality: Left;  . ANTERIOR FUSION CERVICAL SPINE  02/06/06   C4-5, C5-6, C6-7; SURGEON DR. MAX COHEN  . CARDIAC CATHETERIZATION  10/31/04   2009  . CAROTID ENDARTERECTOMY  11/10/10  . CAROTID ENDARTERECTOMY Left 11/10/2010   Subtotal occlusion of left internal carotid artery with left hemispheric transient ischemic attacks.  . CARPAL TUNNEL RELEASE Right 10/21/2013   Procedure: RIGHT CARPAL  TUNNEL RELEASE;  Surgeon: Wynonia Sours, MD;  Location: [facility];  Service: Orthopedics;  Laterality: Right;  . CHOLECYSTECTOMY    . COLONOSCOPY    . ESOPHAGEAL MANOMETRY Bilateral 07/19/2014   Procedure: ESOPHAGEAL MANOMETRY (EM);  Surgeon: Jerene Bears, MD;  Location: [facility];  Service: Gastroenterology;  Laterality: Bilateral;  . FEMORAL ARTERY STENT     x6  . FOOT SURGERY  04/25/2016    EXCISION BASE 5TH METATARSAL AND PARTIAL CUBOID LEFT FOOT  . HERNIA REPAIR     LEFT INGUINAL AND UMBILICAL REPAIRS  . I&D EXTREMITY Left 04/25/2016   Procedure: EXCISION BASE 5TH METATARSAL AND PARTIAL CUBOID LEFT FOOT;  Surgeon: Newt Minion, MD;  Location: [facility];  Service: Orthopedics;  Laterality: Left;  . JOINT REPLACEMENT Right 2001   Total  . LAMINECTOMY     X 3 LUMBAR AND X 2 CERVICAL SPINE OPERATIONS  . LAPAROSCOPIC CHOLECYSTECTOMY W/ CHOLANGIOGRAPHY  11/09/04   SURGEON DR. Luella Cook  . LEFT HEART CATHETERIZATION WITH CORONARY ANGIOGRAM N/A 10/29/2014   Procedure:  LEFT HEART CATHETERIZATION WITH CORONARY ANGIOGRAM;  Surgeon: Laverda Page, MD;  Location: [facility] CATH LAB;  Service: Cardiovascular;  Laterality: N/A;  . LOWER EXTREMITY ANGIOGRAM N/A 03/19/2012   Procedure: LOWER EXTREMITY ANGIOGRAM;  Surgeon: Burnell Blanks, MD;  Location: [facility] CATH LAB;  Service: Cardiovascular;  Laterality: N/A;  . NECK SURGERY    . PENILE PROSTHESIS IMPLANT  08/14/05   INFRAPUBIC INSERTION OF INFLATABLE PENILE PROSTHESIS; SURGEON DR. Amalia Hailey  . PERCUTANEOUS CORONARY STENT INTERVENTION (PCI-S) Right 10/29/2014   Procedure: PERCUTANEOUS CORONARY STENT INTERVENTION (PCI-S);  Surgeon: Laverda Page, MD;  Location: [facility] CATH LAB;  Service: Cardiovascular;  Laterality: Right;  . SHOULDER ARTHROSCOPY    . SPINE SURGERY    . TONSILLECTOMY    . TOTAL KNEE ARTHROPLASTY  07/2002   RIGHT KNEE ; SURGEON  DR. [name] ALSO HAD ARTHROSCOPIC RIGHT KNEE IN  10/2001  . ULNAR NERVE TRANSPOSITION Right  10/21/2013   Procedure: RIGHT ELBOW  ULNAR NERVE DECOMPRESSION;  Surgeon: Wynonia Sours, MD;  Location: [facility];  Service: Orthopedics;  Laterality: Right;    Family History  Problem Relation Age of Onset  . Heart disease Father     Before age 39-  CAD, BPG  . Diabetes Father     Amputation  . Cancer Father     PROSTATE  . Hyperlipidemia Father   . Hypertension Father   . Heart attack Father     Triple BPG  . Varicose Veins Father   . Colon cancer Brother   . Cancer Brother     Colon  . Diabetes Brother   . Heart disease Brother 74    A-Fib. Before age 8  . Hyperlipidemia Brother   . Hypertension Brother   . Cancer Sister     Breast  . Hyperlipidemia Sister   . Hypertension Sister   . Hypertension Son   . Arthritis      GRANDMOTHER  . Hypertension      OTHER FAMILY MEMBERS   Social History:  reports that he quit smoking about 4 years ago. His smoking use included E-cigarettes. He has a 17.50 pack-year smoking history. He has never used smokeless tobacco. He reports that he drinks alcohol. He reports that he does not use drugs.  Allergies:  Allergies  Allergen Reactions  . Ivp Dye [Iodinated Diagnostic Agents] Anaphylaxis and Other (See Comments)    Breathing problems   . Adhesive [Tape] Rash    Rash after 2-3 days use    Review of Systems - Negative except chest pain with elevated blood pressure, occasional visual disturbances with elevated blood pressure, headache with elevated blood pressure..  Denies dyspnea, PND or orthopnea, denies recent weight changes, no leg edema painful swelling of the lower extremity that is worse than previous.  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 rate 19, height 6' 2"  (1.88 m), weight 114.4 kg (252 lb 4.8 oz), SpO2 99 %.   General appearance: alert, cooperative, appears stated age, no distress and mildly obese Eyes: negative  findings: lids and lashes normal Neck: no adenopathy, no carotid bruit, no JVD, supple, symmetrical, trachea midline and thyroid not enlarged, symmetric, no tenderness/mass/nodules Resp: clear to auscultation bilaterally Chest wall: no tenderness Cardio: regular rate and rhythm, S1, S2 normal, no murmur, click, rub or gallop GI: soft, non-tender; bowel sounds normal; no masses,  no organomegaly Extremities: edema left kelow knee  2 plus, non tender edema Pulses: Bilateral femoral faint with bruit, popliteal pulse bilateral normal, pedal pulses left dorsalis pedis 2+, PT 1+.  Right below the knee amputation present.  Amputation stump healthy. Skin: Skin color, texture, turgor normal. No rashes or lesions Neurologic: Grossly normal  Results for orders placed or performed during the hospital encounter of 07/30/16 (from the past 48 hour(s))  Urine rapid drug screen (hosp performed)     Status: None   Collection Time: 07/30/16  5:56 PM  Result Value Ref Range   Opiates NONE DETECTED NONE DETECTED   Cocaine NONE DETECTED NONE DETECTED   Benzodiazepines NONE DETECTED NONE DETECTED   Amphetamines NONE DETECTED NONE DETECTED   Tetrahydrocannabinol NONE DETECTED NONE DETECTED   Barbiturates NONE DETECTED NONE DETECTED    Comment:        DRUG SCREEN FOR MEDICAL PURPOSES ONLY.  IF CONFIRMATION IS NEEDED FOR ANY PURPOSE, NOTIFY LAB WITHIN 5 DAYS.        LOWEST DETECTABLE LIMITS FOR URINE DRUG SCREEN Drug Class       Cutoff (ng/mL) Amphetamine      1000 Barbiturate      200 Benzodiazepine   025 Tricyclics       427 Opiates          300 Cocaine          300 THC              50   Urinalysis, Routine w reflex microscopic     Status: Abnormal   Collection Time: 07/30/16  5:56 PM  Result Value Ref Range   Color, Urine YELLOW YELLOW   APPearance CLEAR CLEAR   Specific Gravity, Urine 1.009 1.005 - 1.030   pH 7.5 5.0 - 8.0   Glucose, UA NEGATIVE NEGATIVE mg/dL   Hgb urine dipstick TRACE (A)  NEGATIVE   Bilirubin Urine NEGATIVE NEGATIVE   Ketones, ur NEGATIVE NEGATIVE mg/dL   Protein, ur 100 (A) NEGATIVE mg/dL   Nitrite NEGATIVE NEGATIVE   Leukocytes, UA NEGATIVE NEGATIVE  Urine microscopic-add on     Status: None   Collection Time: 07/30/16  5:56 PM  Result Value Ref Range   Squamous Epithelial / LPF NONE SEEN NONE SEEN   WBC, UA 0-5 0 - 5 WBC/hpf   RBC / HPF 0-5 0 - 5 RBC/hpf   Bacteria, UA NONE SEEN NONE SEEN  Comprehensive metabolic panel     Status: Abnormal   Collection Time: 07/30/16  6:37 PM  Result Value Ref Range   Sodium 141 135 - 145 mmol/L   Potassium 3.9 3.5 - 5.1 mmol/L   Chloride 106 101 - 111 mmol/L   CO2 28 22 - 32 mmol/L   Glucose, Bld 94 65 - 99 mg/dL   BUN 15 6 - 20 mg/dL   Creatinine, Ser 1.27 (H) 0.61 - 1.24 mg/dL   Calcium 9.1 8.9 - 10.3 mg/dL   Total Protein 6.2 (L) 6.5 - 8.1 g/dL   Albumin 3.7 3.5 - 5.0 g/dL   AST 21 15 - 41 U/L   ALT 16 (L) 17 - 63 U/L   Alkaline Phosphatase 104 38 - 126 U/L   Total Bilirubin 0.5 0.3 - 1.2 mg/dL   GFR calc non Af Amer 59 (L) >60 mL/min   GFR calc Af Amer >60 >60 mL/min    Comment: (NOTE) The eGFR has been calculated using the CKD EPI equation. This calculation has not been validated in all clinical situations. eGFR's  persistently <60 mL/min signify possible Chronic Kidney Disease.    Anion gap 7 5 - 15  CBC with Differential     Status: Abnormal   Collection Time: 07/30/16  6:37 PM  Result Value Ref Range   WBC 6.8 4.0 - 10.5 K/uL   RBC 3.95 (L) 4.22 - 5.81 MIL/uL   Hemoglobin 10.7 (L) 13.0 - 17.0 g/dL   HCT 31.3 (L) 39.0 - 52.0 %   MCV 79.2 78.0 - 100.0 fL   MCH 27.1 26.0 - 34.0 pg   MCHC 34.2 30.0 - 36.0 g/dL   RDW 14.1 11.5 - 15.5 %   Platelets 193 150 - 400 K/uL   Neutrophils Relative % 64 %   Neutro Abs 4.3 1.7 - 7.7 K/uL   Lymphocytes Relative 24 %   Lymphs Abs 1.6 0.7 - 4.0 K/uL   Monocytes Relative 10 %   Monocytes Absolute 0.7 0.1 - 1.0 K/uL   Eosinophils Relative 2 %    Eosinophils Absolute 0.2 0.0 - 0.7 K/uL   Basophils Relative 0 %   Basophils Absolute 0.0 0.0 - 0.1 K/uL  Troponin I     Status: None   Collection Time: 07/30/16  6:37 PM  Result Value Ref Range   Troponin I <0.03 <0.03 ng/mL  Ethanol     Status: None   Collection Time: 07/30/16  6:37 PM  Result Value Ref Range   Alcohol, Ethyl (B) <5 <5 mg/dL    Comment:        LOWEST DETECTABLE LIMIT FOR SERUM ALCOHOL IS 5 mg/dL FOR MEDICAL PURPOSES ONLY   Glucose, capillary     Status: Abnormal   Collection Time: 07/31/16 12:36 AM  Result Value Ref Range   Glucose-Capillary 244 (H) 65 - 99 mg/dL  Glucose, capillary     Status: None   Collection Time: 07/31/16  6:36 AM  Result Value Ref Range   Glucose-Capillary 84 65 - 99 mg/dL   Comment 1 Notify RN    Comment 2 Document in Chart    Dg Chest 2 View  Result Date: 07/30/2016 CLINICAL DATA:  Hypertensive, LEFT chest pain and shortness of breath for 3 weeks. History of COPD. EXAM: CHEST  2 VIEW COMPARISON:  Chest radiograph April 28, 2016 FINDINGS: Cardiomediastinal silhouette is unremarkable for this low inspiratory examination with crowded vasculature markings. The lungs are clear without pleural effusions or focal consolidations. Trachea projects midline and there is no pneumothorax. Included soft tissue planes and osseous structures are non-suspicious. Cervical spine hardware. Moderate degenerative change of the thoracic spine. IMPRESSION: No acute cardiopulmonary process for this low inspiratory examination. Electronically Signed   By: Elon Alas M.D.   On: 07/30/2016 18:33    Labs:   Lab Results  Component Value Date   WBC 6.8 07/30/2016   HGB 10.7 (L) 07/30/2016   HCT 31.3 (L) 07/30/2016   MCV 79.2 07/30/2016   PLT 193 07/30/2016    Recent Labs Lab 07/30/16 1837  NA 141  K 3.9  CL 106  CO2 28  BUN 15  CREATININE 1.27*  CALCIUM 9.1  PROT 6.2*  BILITOT 0.5  ALKPHOS 104  ALT 16*  AST 21  GLUCOSE 94   HEMOGLOBIN  A1C Lab Results  Component Value Date   HGBA1C 8.6 (H) 05/04/2016   MPG 200 05/04/2016    Cardiac Panel (last 3 results)  Recent Labs  07/30/16 1837  TROPONINI <0.03    Lab Results  Component Value Date  CKTOTAL 101 06/27/2012   CKMB 2.2 11/02/2011   TROPONINI <0.03 07/30/2016     TSH No results for input(s): TSH in the last 8760 hours.  EKG 07/31/2016: Normal sinus rhythm at rate of 69 bpm, left axis deviation, left anterior fascicular block, right bundle branch block.  LVH with repolarization abnormality.  RVH, biventricular hypertrophy.  Poor R-wave progression, cannot exclude anterior infarct old.  No significant change from prior EKG compared to 04/25/2016..  Medications Prior to Admission  Medication Sig Dispense Refill  . ALPRAZolam (XANAX) 0.25 MG tablet Take 0.25 mg by mouth daily.  0  . amLODipine-olmesartan (AZOR) 5-20 MG tablet Take 1 tablet by mouth at bedtime.    Marland Kitchen aspirin EC 81 MG tablet Take 81 mg by mouth daily.    Marland Kitchen atorvastatin (LIPITOR) 40 MG tablet Take 40 mg by mouth daily.    . carvedilol (COREG) 25 MG tablet Take 25 mg by mouth 2 (two) times daily.   0  . clobetasol (TEMOVATE) 0.05 % external solution Apply 1 application topically daily as needed (dry scalp).    . clopidogrel (PLAVIX) 75 MG tablet Take 75 mg by mouth daily.     . clotrimazole-betamethasone (LOTRISONE) cream Apply 1 application topically daily as needed (dry skin - beard).     . collagenase (SANTYL) ointment Apply topically daily. (Patient taking differently: Apply 1 application topically daily. TO AFFECTED SITE(S)) 15 g 0  . ezetimibe (ZETIA) 10 MG tablet Take 10 mg by mouth daily.    . fentaNYL (DURAGESIC - DOSED MCG/HR) 50 MCG/HR Place 1 patch (50 mcg total) onto the skin every 3 (three) days. 3 patch 0  . fluticasone (CUTIVATE) 0.05 % cream Apply 1 application topically 2 (two) times daily as needed (dry skin on face).     Marland Kitchen glyBURIDE-metformin (GLUCOVANCE) 2.5-500 MG per tablet  Take 2 tablets by mouth 2 (two) times daily with a meal.     . HYDROmorphone (DILAUDID) 2 MG tablet Take 1 tablet (2 mg total) by mouth at bedtime. (Patient taking differently: Take 2 mg by mouth at bedtime as needed (for breakthrough pain). ) 10 tablet 0  . insulin glargine (LANTUS) 100 UNIT/ML injection Inject 0.6 mLs (60 Units total) into the skin at bedtime. (Patient taking differently: Inject 50 Units into the skin at bedtime. ) 10 mL 0  . Insulin Glulisine (APIDRA) 100 UNIT/ML Solostar Pen Inject 13 Units into the skin 3 (three) times daily with meals. (Patient taking differently: Inject 10 Units into the skin 3 (three) times daily before meals. ) 15 mL 0  . isosorbide mononitrate (IMDUR) 120 MG 24 hr tablet Take 120 mg by mouth daily.    . Linaclotide (LINZESS) 145 MCG CAPS capsule Take 1 capsule (145 mcg total) by mouth daily as needed. (Patient taking differently: Take 145 mcg by mouth daily as needed (constipation). ) 30 capsule 2  . meloxicam (MOBIC) 15 MG tablet Take 15 mg by mouth at bedtime.   0  . Multiple Vitamin (MULTIVITAMIN WITH MINERALS) TABS tablet Take 1 tablet by mouth daily.    . naproxen sodium (ALEVE) 220 MG tablet Take 220 mg by mouth 2 (two) times daily with a meal.    . nitroGLYCERIN (NITRO-BID) 2 % ointment Apply 0.75 inches topically daily as needed for chest pain.     . nitroGLYCERIN (NITROSTAT) 0.4 MG SL tablet Place 1 tablet (0.4 mg total) under the tongue every 5 (five) minutes as needed (as needed for esophageal spasm). (Patient  taking differently: Place 0.4 mg under the tongue every 5 (five) minutes as needed for chest pain. ) 30 tablet 1  . olmesartan (BENICAR) 40 MG tablet Take 40 mg by mouth every morning.     Marland Kitchen omega-3 acid ethyl esters (LOVAZA) 1 G capsule Take 2 g by mouth 2 (two) times daily.    . ondansetron (ZOFRAN-ODT) 4 MG disintegrating tablet Take 4 mg by mouth 4 (four) times daily as needed for nausea.    Marland Kitchen oxyCODONE-acetaminophen (PERCOCET/ROXICET)  5-325 MG tablet Take 1 tablet by mouth every 8 (eight) hours as needed. (Patient taking differently: Take 1 tablet by mouth 3 (three) times daily. ) 15 tablet 0  . pantoprazole (PROTONIX) 40 MG tablet Take 1 tablet (40 mg total) by mouth daily before breakfast. 30 tablet 3  . pregabalin (LYRICA) 100 MG capsule Take 1 capsule (100 mg total) by mouth 3 (three) times daily. 30 capsule 0  . Probiotic CAPS Take 2 capsules by mouth daily after breakfast.    . Propylene Glycol (SYSTANE BALANCE OP) Place 2-3 drops into both eyes daily as needed (for dry eyes).    Marland Kitchen rOPINIRole (REQUIP) 2 MG tablet Take 2 mg by mouth at bedtime.     . Tamsulosin HCl (FLOMAX) 0.4 MG CAPS Take 0.4 mg by mouth daily.    Marland Kitchen torsemide (DEMADEX) 20 MG tablet Take 20 mg by mouth every morning. MAY TAKE AN EXTRA 20 MG ONCE A DAY FOR EXCESSIVE FLUID RETENTION    . amoxicillin-clavulanate (AUGMENTIN) 875-125 MG tablet Take 1 tablet by mouth every 12 (twelve) hours. (Patient not taking: Reported on 07/30/2016) 42 tablet 0  . doxycycline (VIBRA-TABS) 100 MG tablet Take 1 tablet (100 mg total) by mouth every 12 (twelve) hours. (Patient not taking: Reported on 07/30/2016) 42 tablet 0  . saccharomyces boulardii (FLORASTOR) 250 MG capsule Take 1 capsule (250 mg total) by mouth 2 (two) times daily. (Patient not taking: Reported on 07/30/2016) 60 capsule 0      Current Facility-Administered Medications:  .  0.9 %  sodium chloride infusion, , Intravenous, Continuous, Daleen Bo, MD, Last Rate: 50 mL/hr at 07/31/16 0215 .  0.9 %  sodium chloride infusion, , Intravenous, STAT, Daleen Bo, MD .  amLODipine (NORVASC) tablet 5 mg, 5 mg, Oral, QHS, 5 mg at 07/31/16 0036 **OR** [DISCONTINUED] irbesartan (AVAPRO) tablet 150 mg, 150 mg, Oral, QHS, Adrian Prows, MD .  aspirin EC tablet 81 mg, 81 mg, Oral, Daily, Daleen Bo, MD, 81 mg at 07/31/16 0909 .  atorvastatin (LIPITOR) tablet 40 mg, 40 mg, Oral, Daily, Daleen Bo, MD, 40 mg at 07/31/16  0909 .  carvedilol (COREG) tablet 25 mg, 25 mg, Oral, BID, Daleen Bo, MD, 25 mg at 07/31/16 0908 .  clopidogrel (PLAVIX) tablet 75 mg, 75 mg, Oral, Daily, Daleen Bo, MD, 75 mg at 07/31/16 0908 .  ezetimibe (ZETIA) tablet 10 mg, 10 mg, Oral, Daily, Daleen Bo, MD, 10 mg at 07/31/16 0908 .  fentaNYL (DURAGESIC - dosed mcg/hr) patch 50 mcg, 50 mcg, Transdermal, Q72H, Daleen Bo, MD .  glyBURIDE (DIABETA) tablet 2.5 mg, 2.5 mg, Oral, BID WC, 2.5 mg at 07/31/16 0132 **AND** metFORMIN (GLUCOPHAGE) tablet 500 mg, 500 mg, Oral, BID WC, Adrian Prows, MD, 500 mg at 07/31/16 0909 .  hydrALAZINE (APRESOLINE) tablet 50 mg, 50 mg, Oral, Q8H, Adrian Prows, MD, 50 mg at 07/31/16 0919 .  HYDROmorphone (DILAUDID) injection 0.5 mg, 0.5 mg, Intravenous, Q3H PRN, Adrian Prows, MD, 0.5 mg at 07/31/16  9480 .  irbesartan (AVAPRO) tablet 300 mg, 300 mg, Oral, Daily, Daleen Bo, MD, 300 mg at 07/31/16 0909 .  isosorbide mononitrate (IMDUR) 24 hr tablet 120 mg, 120 mg, Oral, Daily, Daleen Bo, MD, 120 mg at 07/31/16 0908 .  labetalol (NORMODYNE,TRANDATE) injection 20 mg, 20 mg, Intravenous, Q10 min PRN, Daleen Bo, MD, 20 mg at 07/31/16 0209 .  labetalol (NORMODYNE,TRANDATE) injection 20 mg, 20 mg, Intravenous, Q3H PRN, Daleen Bo, MD, 20 mg at 07/31/16 0039 .  meloxicam (MOBIC) tablet 15 mg, 15 mg, Oral, QHS, Daleen Bo, MD .  multivitamin with minerals tablet 1 tablet, 1 tablet, Oral, Daily, Daleen Bo, MD, 1 tablet at 07/31/16 0909 .  nitroGLYCERIN (NITROGLYN) 2 % ointment 0.75 inch, 0.75 inch, Topical, Daily PRN, Daleen Bo, MD, 0.75 inch at 07/31/16 0210 .  omega-3 acid ethyl esters (LOVAZA) capsule 2 g, 2 g, Oral, BID, Daleen Bo, MD, 2 g at 07/31/16 0908 .  ondansetron (ZOFRAN-ODT) disintegrating tablet 4 mg, 4 mg, Oral, QID PRN, Daleen Bo, MD .  oxyCODONE-acetaminophen (PERCOCET/ROXICET) 5-325 MG per tablet 1 tablet, 1 tablet, Oral, Q8H PRN, Daleen Bo, MD, 1 tablet at 07/31/16  0908 .  pantoprazole (PROTONIX) EC tablet 40 mg, 40 mg, Oral, QAC breakfast, Daleen Bo, MD, 40 mg at 07/31/16 0908 .  pregabalin (LYRICA) capsule 100 mg, 100 mg, Oral, TID, Daleen Bo, MD, 100 mg at 07/31/16 0909 .  rOPINIRole (REQUIP) tablet 2 mg, 2 mg, Oral, QHS, Daleen Bo, MD .  tamsulosin [facility]) capsule 0.4 mg, 0.4 mg, Oral, Daily, Daleen Bo, MD, 0.4 mg at 07/31/16 0909 .  torsemide (DEMADEX) tablet 20 mg, 20 mg, Oral, q morning - 10a, Daleen Bo, MD, 20 mg at 07/31/16 0909    Assessment/Plan 1. Accelerated HTN 2. Atypical chest pain 3. CAD s/p angioplastyto the right coronary artery on 10/29/2014 4. Uncontrolled diabetes mellitus type 2 with PAD and stage 3 CKD 5. Mixed hyperlipidemia 6. Carotid artery disease s/p carotid endarterectomy on 02/10/2011 (Carotid artery duplex 03/23/2016: Less than 40% right ICA stenosis, patent left carotid endarterectomy) 7. PAD s/p left popliteal artery atherectomy in June 2012, right below knee angioplasty in 2013, and left below knee PTA in 2014 by Dr. Brunetta Jeans at [place]. (Repeat angiogram 09/15/2013: Patent angioplasty sites.) 8. 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 Previous left 4th toe and right 4th and 5th toe amputation; 2013 and 2014 by Dr. Wylene Simmer for diabetic osteomyelitis) 9. Sleep apnea  10. Chronic anemia  Recommendation: I will add clonidine patch to his present medical regimen, increases amlodipine to 10 mg.  Watching for additional 24 hours prior to discharge.   Increase carvedilol from 25 mg twice a day to 37.5 mg by mouth twice a day. Do not suspect ACS.  Adrian Prows, MD 07/31/2016, 10:10 AM [facility]. [place] Pager: [phone] Office: [phone] If no answer: Cell:  [phone]

## 2016-08-01 DIAGNOSIS — I130 Hypertensive heart and chronic kidney disease with heart failure and stage 1 through stage 4 chronic kidney disease, or unspecified chronic kidney disease: Secondary | ICD-10-CM | POA: Diagnosis not present

## 2016-08-01 LAB — GLUCOSE, CAPILLARY
Glucose-Capillary: 88 mg/dL (ref 65–99)
Glucose-Capillary: 106 mg/dL — ABNORMAL HIGH (ref 65–99)
Glucose-Capillary: 206 mg/dL — ABNORMAL HIGH (ref 65–99)

## 2016-08-01 MED ORDER — AMLODIPINE-OLMESARTAN 5-20 MG PO TABS: 2.0000 | ORAL_TABLET | Freq: Every evening | ORAL | 1 refills | Status: DC

## 2016-08-01 MED ORDER — CARVEDILOL 25 MG PO TABS: 37.5000 mg | ORAL_TABLET | Freq: Two times a day (BID) | ORAL | 2 refills | Status: DC

## 2016-08-01 MED ORDER — CLONIDINE HCL 0.2 MG/24HR TD PTWK: 0.2000 mg | MEDICATED_PATCH | TRANSDERMAL | 12 refills | Status: DC

## 2016-08-01 NOTE — Discharge Summary (Signed)
Physician Discharge Summary  Patient ID: [name] MRN: [id number] DOB/AGE: Aug 21, 1956 60 y.o.  Admit date: 07/30/2016 Discharge date: 08/01/2016  Primary Discharge Diagnosis 1. Hypertensive urgency Secondary Discharge Diagnosis 2. Atypical chest pain 3. CAD s/p angioplastyto the right coronary artery on 10/29/2014 4. Uncontrolled diabetes mellitus type 2 with PAD and stage 3 CKD 5. Mixed hyperlipidemia 6. Carotid artery disease s/p carotid endarterectomy on 02/10/2011 (Carotid artery duplex 03/23/2016: Less than 40% right ICA stenosis, patent left carotid endarterectomy) 7. PAD s/p left popliteal artery atherectomy in June 2012, right below knee angioplasty in 2013, and left below knee PTA in 2014 by Dr. Brunetta Jeans at [place]. (Repeat angiogram 09/15/2013: Patent angioplasty sites.) 8. 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 Previous left 4th toe and right 4th and 5th toe amputation; 2013 and 2014 by Dr. Wylene Simmer for diabetic osteomyelitis)  Hospital Course:  [name]  is a 60 y.o. male with a h/o COPD, degenerative joint disease and spine disease, uncontrolled diabetes mellitus, Barrett's esophagus, sleep apnea, HTN, known CAD, s/p angioplastyto the right coronary artery on 10/29/2014, and carotid artery disease s/p carotid endarterectomy on 11/10/2010. He also has severe PAD, diabetic foot ulcer with multiple admission to the hospital for osteomyelitis of his toes, has had toe amputations in both the lower extremities, small vessel angioplasty by Dr. Brunetta Jeans in [place]. He underwent right foot amputations on 01/14/2015 due to diabetic ulcer and osteomyelitis.   Patient admitted on 07/30/2016 with chest pain, markedly elevated blood pressure, the blood pressure of 249/112 mmHg.  After controlling his blood pressure with IV labetalol and hydralazine, symptoms improved.  He was ruled out for myocardial infarction.  Medications where reconciled and  Coreg was increased and hydralazine was added.  He remained asymptomatic with good blood pressure control and hence felt stable for discharge on 08/01/2016  Recommendations on discharge: Patient has been started on clonidine patch along with hydralazine and carvedilol dosage has been increased from 25 mg twice a day to 37.5 mg by mouth twice a day.  I'll follow him up in 2 weeks in the outpatient basis.  Discharge Exam: Blood pressure (!) 134/58, pulse 66, temperature 97.5 F (36.4 C), temperature source Oral, resp. rate 18, height 6\' 2"  (1.88 m), weight 114.4 kg (252 lb 4.8 oz), SpO2 96 %.   General appearance: alert, cooperative, appears stated age, no distress and mildly obese Eyes: negative findings: lids and lashes normal Neck: no adenopathy, no carotid bruit, no JVD, supple, symmetrical, trachea midline and thyroid not enlarged, symmetric, no tenderness/mass/nodules Resp: clear to auscultation bilaterally Chest wall: no tenderness Cardio: regular rate and rhythm, S1, S2 normal, no murmur, click, rub or gallop GI: soft, non-tender; bowel sounds normal; no masses,  no organomegaly Extremities: edema left kelow knee 2 plus, non tender edema Pulses: Bilateral femoral faint with bruit, popliteal pulse bilateral normal, pedal pulses left dorsalis pedis 2+, PT 1+.  Right below the knee amputation present.  Amputation stump healthy. Skin: Skin color, texture, turgor normal. No rashes or lesions Neurologic: Grossly normal  Labs:   Lab Results  Component Value Date   WBC 6.8 07/30/2016   HGB 10.7 (L) 07/30/2016   HCT 31.3 (L) 07/30/2016   MCV 79.2 07/30/2016   PLT 193 07/30/2016    Recent Labs Lab 07/30/16 1837  NA 141  K 3.9  CL 106  CO2 28  BUN 15  CREATININE 1.27*  CALCIUM 9.1  PROT 6.2*  BILITOT 0.5  ALKPHOS 104  ALT 16*  AST 21  GLUCOSE 94   HEMOGLOBIN A1C Lab Results  Component Value Date   HGBA1C 8.6 (H) 05/04/2016   MPG 200 05/04/2016    Recent Labs   07/30/16 1837  TROPONINI <0.03    Lab Results  Component Value Date   CKTOTAL 101 06/27/2012   CKMB 2.2 11/02/2011   TROPONINI <0.03 07/30/2016     EKG 07/31/2016: Normal sinus rhythm at rate of 69 bpm, left axis deviation, left anterior fascicular block, right bundle branch block.  LVH with repolarization abnormality.  RVH, biventricular hypertrophy.  Poor R-wave progression, cannot exclude anterior infarct old.  No significant change from prior EKG compared to 04/25/2016  Radiology: Dg Chest 2 View  Result Date: 07/30/2016 CLINICAL DATA:  Hypertensive, LEFT chest pain and shortness of breath for 3 weeks. History of COPD. EXAM: CHEST  2 VIEW COMPARISON:  Chest radiograph April 28, 2016 FINDINGS: Cardiomediastinal silhouette is unremarkable for this low inspiratory examination with crowded vasculature markings. The lungs are clear without pleural effusions or focal consolidations. Trachea projects midline and there is no pneumothorax. Included soft tissue planes and osseous structures are non-suspicious. Cervical spine hardware. Moderate degenerative change of the thoracic spine. IMPRESSION: No acute cardiopulmonary process for this low inspiratory examination. Electronically Signed   By: Elon Alas M.D.   On: 07/30/2016 18:33   FOLLOW UP PLANS AND APPOINTMENTS    Medication List    STOP taking these medications   ALEVE 220 MG tablet Generic drug:  naproxen sodium   meloxicam 15 MG tablet Commonly known as:  MOBIC   olmesartan 40 MG tablet Commonly known as:  BENICAR     TAKE these medications   ALPRAZolam 0.25 MG tablet Commonly known as:  XANAX Take 0.25 mg by mouth daily.   amLODipine-olmesartan 5-20 MG tablet Commonly known as:  AZOR Take 2 tablets by mouth every evening. What changed:  how much to take  when to take this   amoxicillin-clavulanate 875-125 MG tablet Commonly known as:  AUGMENTIN Take 1 tablet by mouth every 12 (twelve) hours.   aspirin  EC 81 MG tablet Take 81 mg by mouth daily.   atorvastatin 40 MG tablet Commonly known as:  LIPITOR Take 40 mg by mouth daily.   carvedilol 25 MG tablet Commonly known as:  COREG Take 1.5 tablets (37.5 mg total) by mouth 2 (two) times daily. What changed:  how much to take   clobetasol 0.05 % external solution Commonly known as:  TEMOVATE Apply 1 application topically daily as needed (dry scalp).   cloNIDine 0.2 mg/24hr patch Commonly known as:  CATAPRES - Dosed in mg/24 hr Place 1 patch (0.2 mg total) onto the skin once a week. Start taking on:  08/07/2016   clopidogrel 75 MG tablet Commonly known as:  PLAVIX Take 75 mg by mouth daily.   clotrimazole-betamethasone cream Commonly known as:  LOTRISONE Apply 1 application topically daily as needed (dry skin - beard).   collagenase ointment Commonly known as:  SANTYL Apply topically daily. What changed:  how much to take  additional instructions   doxycycline 100 MG tablet Commonly known as:  VIBRA-TABS Take 1 tablet (100 mg total) by mouth every 12 (twelve) hours.   ezetimibe 10 MG tablet Commonly known as:  ZETIA Take 10 mg by mouth daily.   fentaNYL 50 MCG/HR Commonly known as:  DURAGESIC - dosed mcg/hr Place 1 patch (50 mcg total) onto the skin every 3 (three)  days.   fluticasone 0.05 % cream Commonly known as:  CUTIVATE Apply 1 application topically 2 (two) times daily as needed (dry skin on face).   glyBURIDE-metformin 2.5-500 MG tablet Commonly known as:  GLUCOVANCE Take 2 tablets by mouth 2 (two) times daily with a meal.   HYDROmorphone 2 MG tablet Commonly known as:  DILAUDID Take 1 tablet (2 mg total) by mouth at bedtime. What changed:  when to take this  reasons to take this   insulin glargine 100 UNIT/ML injection Commonly known as:  LANTUS Inject 0.6 mLs (60 Units total) into the skin at bedtime. What changed:  how much to take   Insulin Glulisine 100 UNIT/ML Solostar Pen Commonly  known as:  APIDRA Inject 13 Units into the skin 3 (three) times daily with meals. What changed:  how much to take  when to take this   isosorbide mononitrate 120 MG 24 hr tablet Commonly known as:  IMDUR Take 120 mg by mouth daily.   linaclotide 145 MCG Caps capsule Commonly known as:  LINZESS Take 1 capsule (145 mcg total) by mouth daily as needed. What changed:  reasons to take this   multivitamin with minerals Tabs tablet Take 1 tablet by mouth daily.   NITRO-BID 2 % ointment Generic drug:  nitroGLYCERIN Apply 0.75 inches topically daily as needed for chest pain. What changed:  Another medication with the same name was changed. Make sure you understand how and when to take each.   nitroGLYCERIN 0.4 MG SL tablet Commonly known as:  NITROSTAT Place 1 tablet (0.4 mg total) under the tongue every 5 (five) minutes as needed (as needed for esophageal spasm). What changed:  reasons to take this   omega-3 acid ethyl esters 1 g capsule Commonly known as:  LOVAZA Take 2 g by mouth 2 (two) times daily.   ondansetron 4 MG disintegrating tablet Commonly known as:  ZOFRAN-ODT Take 4 mg by mouth 4 (four) times daily as needed for nausea.   oxyCODONE-acetaminophen 5-325 MG tablet Commonly known as:  PERCOCET/ROXICET Take 1 tablet by mouth every 8 (eight) hours as needed. What changed:  when to take this   pantoprazole 40 MG tablet Commonly known as:  PROTONIX Take 1 tablet (40 mg total) by mouth daily before breakfast.   pregabalin 100 MG capsule Commonly known as:  LYRICA Take 1 capsule (100 mg total) by mouth 3 (three) times daily.   Probiotic Caps Take 2 capsules by mouth daily after breakfast.   rOPINIRole 2 MG tablet Commonly known as:  REQUIP Take 2 mg by mouth at bedtime.   saccharomyces boulardii 250 MG capsule Commonly known as:  FLORASTOR Take 1 capsule (250 mg total) by mouth 2 (two) times daily.   SYSTANE BALANCE OP Place 2-3 drops into both eyes daily as  needed (for dry eyes).   tamsulosin 0.4 MG Caps capsule Commonly known as:  FLOMAX Take 0.4 mg by mouth daily.   torsemide 20 MG tablet Commonly known as:  DEMADEX Take 20 mg by mouth every morning. MAY TAKE AN EXTRA 20 MG ONCE A DAY FOR EXCESSIVE FLUID RETENTION         Adrian Prows, MD 08/01/2016, 7:48 AM  Pager: [phone] Office: [phone] If no answer: [phone]

## 2016-08-07 ENCOUNTER — Ambulatory Visit (INDEPENDENT_AMBULATORY_CARE_PROVIDER_SITE_OTHER): Payer: Medicare Other | Admitting: Orthopedic Surgery

## 2016-08-07 DIAGNOSIS — Z89422 Acquired absence of other left toe(s): Secondary | ICD-10-CM

## 2016-08-07 DIAGNOSIS — S98132A Complete traumatic amputation of one left lesser toe, initial encounter: Secondary | ICD-10-CM

## 2016-08-07 DIAGNOSIS — Z89511 Acquired absence of right leg below knee: Secondary | ICD-10-CM

## 2016-08-07 NOTE — Progress Notes (Signed)
Office Visit Note   Patient: [name]           Date of Birth: 1956/08/19           MRN: [id number] Visit Date: 08/07/2016              Requested by: Anda Kraft, MD [address] [place], [place] [address] PCP: [name], MD   Assessment & Plan: Visit Diagnoses:  1. Amputated toe of left foot ([place])   2. History of right below knee amputation ([place])     Plan: Continue compression stocking to the left lower extremity daily. Weightbearing as tolerated. Will continue to follow with [facility] for orthotic and prosthetic fabrication. We'll follow up with [place] in the office as needed.  Follow-Up Instructions: Return if symptoms worsen or fail to improve.   Orders:  No orders of the defined types were placed in this encounter.  No orders of the defined types were placed in this encounter.     Procedures: No procedures performed   Clinical Data: No additional findings.   Subjective: No chief complaint on file.   Patient is s/p a left foot 5th ray amputation 10/2015. He is healed there are no open areas he is in a Vive compression sock and extra depth shoe and orthotic from [facility]. He is not currently having any issues and states that he feels well. He ambulates with crutches and a prosthetic on the left side.     Review of Systems  Constitutional: Negative for chills and fever.     Objective: Vital Signs: There were no vitals taken for this visit.  Physical Exam  Constitutional: He is oriented to person, place, and time. He appears well-developed and well-nourished.  Pulmonary/Chest: Effort normal.  Musculoskeletal:       Left foot: There is swelling.  Ray amputation is well-healed. There are no open ulcers. No drainage no erythema. No sign of infection.   Does have pitting edema to the left lower extremity.  Neurological: He is alert and oriented to person, place, and time.  Psychiatric: He has a normal mood and affect.  Nursing note  reviewed.   Ortho Exam  Specialty Comments:  No specialty comments available.  Imaging: No results found.   PMFS History: Patient Active Problem List   Diagnosis Date Noted  . Hypertension 07/30/2016  . History of right below knee amputation ([place]) 07/12/2016  . Poorly controlled type 2 diabetes mellitus with peripheral neuropathy ([place]) 05/09/2016  . Acute osteomyelitis of left foot ([place])   . Pressure ulcer 05/08/2016  . Diabetic foot infection ([place]) 05/04/2016  . Diabetic foot ([place]) 05/04/2016  . Osteomyelitis of ankle or foot, acute ([place]) 04/25/2016  . Type 2 diabetes mellitus with insulin therapy ([place]) 12/16/2015  . Amputated toe ([place]) 10/21/2015  . Diabetic foot ulcer ([place]) 10/12/2015  . Cellulitis of left foot 09/03/2015  . Leg edema, left 09/03/2015  . Acute renal failure ([place]) 09/03/2015  . CAD (dz of distal, mid and proximal RCA with implantation of 3 overlapping drug-eluting stent,) 09/03/2015  . Chronic diastolic heart failure, NYHA class 2 ([place]) 09/03/2015  . Opacity of lung on imaging study 01/14/2015  . Angina pectoris associated with type 2 diabetes mellitus ([place]) 10/28/2014  . Hyperlipidemia 08/25/2014  . Chronic total occlusion of artery of the extremities ([place]) 04/08/2012  . Onychomycosis 02/01/2012  . Diabetes mellitus, insulin dependent (IDDM), controlled ([place]) 11/01/2011  . Occlusion and stenosis of carotid artery without mention of  cerebral infarction 06/12/2011  . Aftercare following surgery of the circulatory system, [place] 06/12/2011  . GERD (gastroesophageal reflux disease) 05/08/2011  . Barrett's esophagus without dysplasia 05/08/2011  . PVD (peripheral vascular disease) ([place]) 02/01/2011  . HTN (hypertension) 02/01/2011  . Former tobacco use 02/01/2011   Past Medical History:  Diagnosis Date  . Barrett's esophagus   . Carotid artery occlusion 11/10/10   LEFT CAROTID ENDARTERECTOMY  . CHF (congestive heart failure) ([place])   . Complication of  anesthesia    BP WENT UP AT [facility] "  . COPD (chronic obstructive pulmonary disease) ([place])   . Coronary artery disease   . Diabetes mellitus   . Diverticulosis of colon (without mention of hemorrhage)   . DJD (degenerative joint disease)   . Emphysema of lung ([place])   . Fatty liver   . Full dentures   . GERD (gastroesophageal reflux disease)   . H/O Doppler ultrasound 06/28/2012   Lower venous duplex left-- findings consistent with acute deep vein thrombosis involving the personal vein of the left lower extremity. No evidence of Baker's cyst on the left. Duplex scan of the left lower extremity arteries reveal adequate blood flow.   . H/O hiatal hernia   . History of cardiovascular stress test 03/23/2011   ejection fraction measured 51%, with an end-diastolic volume of 562 ml and an end-systolic volume of 81 ml. no evidence of myocardial ischemia.  Marland Kitchen History of echocardiogram 08/22/2011   Mildly hypertrophic left ventricle with normal systolic function and mild diastolic dysfunction. mildly dilated left atrium. no significant valvular abnormalities. no pericardial effusion no change from previous study 10/2010. the ejection fraction =>55%  . History of Holter monitoring 02/14/2012   Scan quality is fair due to baseline artifact. Predominant rhythm is Sinus. Veentricular Ectopy was noted in single beat form. The total recording time was 24:00 hours. total beats =[id number]. the total time analyzed was 23:58 hours. Heart  Max = 102 at 1:18:22, Min= 52 at 9:56:19 am., Avg= 70. There were 0 pause(s) > 2.5 seconds. the longest pause = 0.00 sec. at 1:51 pm(1)  . Hyperlipidemia   . Hypertension   . Neuromuscular disorder ([place])    peripheral neuropathy  . OSA (obstructive sleep apnea) 12/26/08   AHI of 6.3/hr overall, 32.7/hr during REM sleep and an RDI of 24.8/hr and 50.9/hr.  . Osteomyelitis ([place])   . Osteomyelitis ([place])    left 5th metatarsal  . PAD (peripheral artery disease) ([place])    Distal aortogram  June 2012. Atherectomy left popliteal artery July 2012.   . Sleep apnea    has not used CPAP in e years - took self off  . Slurred speech    AS PER WIFE IN D/C NOTE 11/10/10  . Substernal chest pain   . Visual color changes    LEFT EYE; BUT NOT TOTAL BLINDNESS  . Wears glasses     Family History  Problem Relation Age of Onset  . Heart disease Father     Before age 104-  CAD, BPG  . Diabetes Father     Amputation  . Cancer Father     PROSTATE  . Hyperlipidemia Father   . Hypertension Father   . Heart attack Father     Triple BPG  . Varicose Veins Father   . Colon cancer Brother   . Cancer Brother     Colon  . Diabetes Brother   . Heart disease Brother 76    A-Fib. Before age 61  .  Hyperlipidemia Brother   . Hypertension Brother   . Cancer Sister     Breast  . Hyperlipidemia Sister   . Hypertension Sister   . Hypertension Son   . Arthritis      GRANDMOTHER  . Hypertension      OTHER FAMILY MEMBERS    Past Surgical History:  Procedure Laterality Date  . AMPUTATION  11/05/2011   Procedure: AMPUTATION RAY;  Surgeon: Wylene Simmer, MD;  Location: [facility];  Service: Orthopedics;  Laterality: Right;  Amputation of Right 4&5th Toes  . AMPUTATION Left 11/26/2012   Procedure: AMPUTATION RAY;  Surgeon: Wylene Simmer, MD;  Location: [facility];  Service: Orthopedics;  Laterality: Left;  fourth ray amputation  . AMPUTATION Right 08/27/2014   Procedure: Transmetatarsal Amputation;  Surgeon: Newt Minion, MD;  Location: [facility];  Service: Orthopedics;  Laterality: Right;  . AMPUTATION Right 01/14/2015   Procedure: AMPUTATION BELOW KNEE;  Surgeon: Newt Minion, MD;  Location: [facility];  Service: Orthopedics;  Laterality: Right;  . AMPUTATION Left 10/21/2015   Procedure: Left Foot 5th Ray Amputation;  Surgeon: Newt Minion, MD;  Location: [facility];  Service: Orthopedics;  Laterality: Left;  . ANTERIOR FUSION CERVICAL SPINE  02/06/06   C4-5, C5-6, C6-7; SURGEON DR. MAX COHEN  . CARDIAC CATHETERIZATION   10/31/04   2009  . CAROTID ENDARTERECTOMY  11/10/10  . CAROTID ENDARTERECTOMY Left 11/10/2010   Subtotal occlusion of left internal carotid artery with left hemispheric transient ischemic attacks.  . CARPAL TUNNEL RELEASE Right 10/21/2013   Procedure: RIGHT CARPAL TUNNEL RELEASE;  Surgeon: Wynonia Sours, MD;  Location: [facility];  Service: Orthopedics;  Laterality: Right;  . CHOLECYSTECTOMY    . COLONOSCOPY    . ESOPHAGEAL MANOMETRY Bilateral 07/19/2014   Procedure: ESOPHAGEAL MANOMETRY (EM);  Surgeon: Jerene Bears, MD;  Location: [facility];  Service: Gastroenterology;  Laterality: Bilateral;  . FEMORAL ARTERY STENT     x6  . FOOT SURGERY  04/25/2016    EXCISION BASE 5TH METATARSAL AND PARTIAL CUBOID LEFT FOOT  . HERNIA REPAIR     LEFT INGUINAL AND UMBILICAL REPAIRS  . I&D EXTREMITY Left 04/25/2016   Procedure: EXCISION BASE 5TH METATARSAL AND PARTIAL CUBOID LEFT FOOT;  Surgeon: Newt Minion, MD;  Location: [facility];  Service: Orthopedics;  Laterality: Left;  . JOINT REPLACEMENT Right 2001   Total  . LAMINECTOMY     X 3 LUMBAR AND X 2 CERVICAL SPINE OPERATIONS  . LAPAROSCOPIC CHOLECYSTECTOMY W/ CHOLANGIOGRAPHY  11/09/04   SURGEON DR. Luella Cook  . LEFT HEART CATHETERIZATION WITH CORONARY ANGIOGRAM N/A 10/29/2014   Procedure: LEFT HEART CATHETERIZATION WITH CORONARY ANGIOGRAM;  Surgeon: Laverda Page, MD;  [facility];  Service: Cardiovascular;  Laterality: N/A;  . LOWER EXTREMITY ANGIOGRAM N/A 03/19/2012   Procedure: LOWER EXTREMITY ANGIOGRAM;  Surgeon: Burnell Blanks, MD;  Location[facility] LAB;  Service: Cardiovascular;  Laterality: N/A;  . NECK SURGERY    . PENILE PROSTHESIS IMPLANT  08/14/05   INFRAPUBIC INSERTION OF INFLATABLE PENILE PROSTHESIS; SURGEON DR. Amalia Hailey  . PERCUTANEOUS CORONARY STENT INTERVENTION (PCI-S) Right 10/29/2014   Procedure: PERCUTANEOUS CORONARY STENT INTERVENTION (PCI-S);  Surgeon: Laverda Page, MD;  Location: [facility] CATH LAB;   Service: Cardiovascular;  Laterality: Right;  . SHOULDER ARTHROSCOPY    . SPINE SURGERY    . TONSILLECTOMY    . TOTAL KNEE ARTHROPLASTY  07/2002   RIGHT KNEE ; SURGEON  DR.  [name] ALSO HAD ARTHROSCOPIC RIGHT KNEE IN  10/2001  . ULNAR NERVE TRANSPOSITION Right 10/21/2013   Procedure: RIGHT ELBOW  ULNAR NERVE DECOMPRESSION;  Surgeon: [place] Sours, MD;  Location: [facility];  Service: Orthopedics;  Laterality: Right;   Social History   Occupational History  . TRUCK DRIVER Unemployed   Social History Main Topics  . Smoking status: Former Smoker    Packs/day: 0.50    Years: 35.00    Types: E-cigarettes    Quit date: 10/28/2011  . Smokeless tobacco: Never Used     Comment: VAPOR CIGARETTES  . Alcohol use 0.0 oz/week     Comment: rare  . Drug use: No  . Sexual activity: Yes    Birth control/ protection: Implant

## 2016-08-09 ENCOUNTER — Ambulatory Visit (INDEPENDENT_AMBULATORY_CARE_PROVIDER_SITE_OTHER): Payer: Medicare Other | Admitting: Family

## 2016-08-09 ENCOUNTER — Ambulatory Visit (INDEPENDENT_AMBULATORY_CARE_PROVIDER_SITE_OTHER): Payer: Medicare Other | Admitting: Orthopedic Surgery

## 2016-09-03 ENCOUNTER — Emergency Department (HOSPITAL_COMMUNITY): Payer: BLUE CROSS/BLUE SHIELD

## 2016-09-03 ENCOUNTER — Encounter (HOSPITAL_COMMUNITY): Payer: Self-pay | Admitting: Emergency Medicine

## 2016-09-03 ENCOUNTER — Emergency Department (HOSPITAL_COMMUNITY)
Admission: EM | Admit: 2016-09-03 | Discharge: 2016-09-04 | Disposition: A | Discharge status: Home / Self Care | Payer: BLUE CROSS/BLUE SHIELD | Attending: Physician Assistant | Admitting: Physician Assistant

## 2016-09-03 DIAGNOSIS — I110 Hypertensive heart disease with heart failure: Secondary | ICD-10-CM | POA: Diagnosis not present

## 2016-09-03 DIAGNOSIS — Z7982 Long term (current) use of aspirin: Secondary | ICD-10-CM | POA: Insufficient documentation

## 2016-09-03 DIAGNOSIS — M545 Low back pain, unspecified: Secondary | ICD-10-CM

## 2016-09-03 DIAGNOSIS — R509 Fever, unspecified: Secondary | ICD-10-CM | POA: Insufficient documentation

## 2016-09-03 DIAGNOSIS — R6883 Chills (without fever): Secondary | ICD-10-CM

## 2016-09-03 DIAGNOSIS — Z79899 Other long term (current) drug therapy: Secondary | ICD-10-CM | POA: Insufficient documentation

## 2016-09-03 DIAGNOSIS — I5032 Chronic diastolic (congestive) heart failure: Secondary | ICD-10-CM | POA: Insufficient documentation

## 2016-09-03 DIAGNOSIS — Z96651 Presence of right artificial knee joint: Secondary | ICD-10-CM | POA: Diagnosis not present

## 2016-09-03 DIAGNOSIS — E1140 Type 2 diabetes mellitus with diabetic neuropathy, unspecified: Secondary | ICD-10-CM | POA: Diagnosis not present

## 2016-09-03 DIAGNOSIS — I2510 Atherosclerotic heart disease of native coronary artery without angina pectoris: Secondary | ICD-10-CM | POA: Insufficient documentation

## 2016-09-03 DIAGNOSIS — L97529 Non-pressure chronic ulcer of other part of left foot with unspecified severity: Secondary | ICD-10-CM | POA: Diagnosis not present

## 2016-09-03 DIAGNOSIS — Z794 Long term (current) use of insulin: Secondary | ICD-10-CM | POA: Insufficient documentation

## 2016-09-03 DIAGNOSIS — J449 Chronic obstructive pulmonary disease, unspecified: Secondary | ICD-10-CM | POA: Diagnosis not present

## 2016-09-03 DIAGNOSIS — E11621 Type 2 diabetes mellitus with foot ulcer: Secondary | ICD-10-CM | POA: Insufficient documentation

## 2016-09-03 DIAGNOSIS — F17290 Nicotine dependence, other tobacco product, uncomplicated: Secondary | ICD-10-CM | POA: Diagnosis not present

## 2016-09-03 LAB — CBC
HCT: 31.9 % — ABNORMAL LOW (ref 39.0–52.0)
Hemoglobin: 11.2 g/dL — ABNORMAL LOW (ref 13.0–17.0)
MCH: 27.8 pg (ref 26.0–34.0)
MCHC: 35.1 g/dL (ref 30.0–36.0)
MCV: 79.2 fL (ref 78.0–100.0)
Platelets: 152 10*3/uL (ref 150–400)
RBC: 4.03 MIL/uL — ABNORMAL LOW (ref 4.22–5.81)
RDW: 13.6 % (ref 11.5–15.5)
WBC: 5.9 10*3/uL (ref 4.0–10.5)

## 2016-09-03 LAB — BASIC METABOLIC PANEL
Anion gap: 6 (ref 5–15)
BUN: 14 mg/dL (ref 6–20)
CO2: 27 mmol/L (ref 22–32)
Calcium: 8.8 mg/dL — ABNORMAL LOW (ref 8.9–10.3)
Chloride: 103 mmol/L (ref 101–111)
Creatinine, Ser: 1.37 mg/dL — ABNORMAL HIGH (ref 0.61–1.24)
GFR calc Af Amer: >60 mL/min (ref >60)
GFR calc non Af Amer: 54 mL/min — ABNORMAL LOW (ref >60)
Glucose, Bld: 298 mg/dL — ABNORMAL HIGH (ref 65–99)
Potassium: 4 mmol/L (ref 3.5–5.1)
Sodium: 136 mmol/L (ref 135–145)

## 2016-09-03 LAB — I-STAT CG4 LACTIC ACID, ED: Lactic Acid, Venous: 2.13 mmol/L (ref 0.5–1.9)

## 2016-09-03 LAB — CBG MONITORING, ED: Glucose-Capillary: 278 mg/dL — ABNORMAL HIGH (ref 65–99)

## 2016-09-03 LAB — I-STAT TROPONIN, ED
Troponin i, poc: 0 ng/mL (ref 0.00–0.08)
Troponin i, poc: 0 ng/mL (ref 0.00–0.08)

## 2016-09-03 LAB — INFLUENZA PANEL BY PCR (TYPE A & B)
Influenza A By PCR: NEGATIVE
Influenza B By PCR: NEGATIVE

## 2016-09-03 LAB — SEDIMENTATION RATE: Sed Rate: 35 mm/hr — ABNORMAL HIGH (ref 0–16)

## 2016-09-03 LAB — C-REACTIVE PROTEIN: CRP: 1.2 mg/dL — ABNORMAL HIGH (ref <1.0)

## 2016-09-03 MED ORDER — LORAZEPAM 2 MG/ML IJ SOLN
1.0000 mg | Freq: Once | INTRAMUSCULAR | Status: AC
Start: 2016-09-03 — End: 2016-09-03
  Administered 2016-09-03: 1 mg via INTRAVENOUS
  Filled 2016-09-03: qty 1

## 2016-09-03 MED ORDER — SODIUM CHLORIDE 0.9 % IV BOLUS (SEPSIS)
500.0000 mL | Freq: Once | INTRAVENOUS | Status: AC
Start: 2016-09-03 — End: 2016-09-04
  Administered 2016-09-03: 500 mL via INTRAVENOUS

## 2016-09-03 MED ORDER — GADOBENATE DIMEGLUMINE 529 MG/ML IV SOLN
20.0000 mL | Freq: Once | INTRAVENOUS | Status: AC | PRN
  Administered 2016-09-03: 20 mL via INTRAVENOUS

## 2016-09-03 MED ORDER — HYDROCODONE-ACETAMINOPHEN 5-325 MG PO TABS
1.0000 | ORAL_TABLET | Freq: Once | ORAL | Status: AC
  Administered 2016-09-03: 1 via ORAL
  Filled 2016-09-03: qty 1

## 2016-09-03 MED ORDER — PREGABALIN 50 MG PO CAPS
100.0000 mg | ORAL_CAPSULE | Freq: Once | ORAL | Status: AC
  Administered 2016-09-03: 100 mg via ORAL
  Filled 2016-09-03: qty 2

## 2016-09-03 NOTE — ED Notes (Signed)
Pt changed own clonidine patch.

## 2016-09-03 NOTE — ED Notes (Signed)
Pt sts he takes percocet 3x day 5/325 and lyrica for neuropathy.   Needs fentanyl patch and clonidine patch changed.

## 2016-09-03 NOTE — ED Triage Notes (Signed)
Pt arrivs from home via [facility] reporting chills followed by new onset CP.  Pt denies hx fever or flu-like illness.  PT denies SOB or cough. EMS reports giving: 324 ASA 2x NTG 6mg  Morphine  Pt reports 7.5/10 pain at this time. Resp e/u

## 2016-09-03 NOTE — ED Provider Notes (Addendum)
[facility] DEPT Provider Note   CSN: [id number] Arrival date & time: 09/03/16  1739     History   Chief Complaint Chief Complaint  Patient presents with  . Chest Pain  . Chills    HPI [name] is a 60 y.o. male.  The history is provided by the patient.   Pt here with multiple compalitns.  Started with chills today and then had both lower back pain and chest pain. Ho multiple surgeries, chronic pain on fentyl and clonidine patches at baseline. Has had surgerys to L and C spine. Ho Stents, htn hld.   No flu symptoms.  Complains of chest pain non radiating, now improved. Patient has back pain but this was worse today, in his lower spine. No noted infection to R BKA or Left foot with partial amp. No cough or urinary symtpoms.    Past Medical History:  Diagnosis Date  . Barrett's esophagus   . Carotid artery occlusion 11/10/10   LEFT CAROTID ENDARTERECTOMY  . CHF (congestive heart failure) ([place])   . Complication of anesthesia    BP WENT UP AT [facility] "  . COPD (chronic obstructive pulmonary disease) ([place])   . Coronary artery disease   . Diabetes mellitus   . Diverticulosis of colon (without mention of hemorrhage)   . DJD (degenerative joint disease)   . Emphysema of lung ([place])   . Fatty liver   . Full dentures   . GERD (gastroesophageal reflux disease)   . H/O Doppler ultrasound 06/28/2012   Lower venous duplex left-- findings consistent with acute deep vein thrombosis involving the personal vein of the left lower extremity. No evidence of Baker's cyst on the left. Duplex scan of the left lower extremity arteries reveal adequate blood flow.   . H/O hiatal hernia   . History of cardiovascular stress test 03/23/2011   ejection fraction measured 51%, with an end-diastolic volume of 032 ml and an end-systolic volume of 81 ml. no evidence of myocardial ischemia.  Marland Kitchen History of echocardiogram 08/22/2011   Mildly hypertrophic left ventricle with normal systolic function and  mild diastolic dysfunction. mildly dilated left atrium. no significant valvular abnormalities. no pericardial effusion no change from previous study 10/2010. the ejection fraction =>55%  . History of Holter monitoring 02/14/2012   Scan quality is fair due to baseline artifact. Predominant rhythm is Sinus. Veentricular Ectopy was noted in single beat form. The total recording time was 24:00 hours. total beats =[id number]. the total time analyzed was 23:58 hours. Heart  Max = 102 at 1:18:22, Min= 52 at 9:56:19 am., Avg= 70. There were 0 pause(s) > 2.5 seconds. the longest pause = 0.00 sec. at 1:51 pm(1)  . Hyperlipidemia   . Hypertension   . Neuromuscular disorder ([place])    peripheral neuropathy  . OSA (obstructive sleep apnea) 12/26/08   AHI of 6.3/hr overall, 32.7/hr during REM sleep and an RDI of 24.8/hr and 50.9/hr.  . Osteomyelitis ([place])   . Osteomyelitis ([place])    left 5th metatarsal  . PAD (peripheral artery disease) ([place])    Distal aortogram June 2012. Atherectomy left popliteal artery July 2012.   . Sleep apnea    has not used CPAP in e years - took self off  . Slurred speech    AS PER WIFE IN D/C NOTE 11/10/10  . Substernal chest pain   . Visual color changes    LEFT EYE; BUT NOT TOTAL BLINDNESS  . Wears glasses     Patient  Active Problem List   Diagnosis Date Noted  . Hypertension 07/30/2016  . History of right below knee amputation ([place]) 07/12/2016  . Poorly controlled type 2 diabetes mellitus with peripheral neuropathy ([place]) 05/09/2016  . Acute osteomyelitis of left foot ([place])   . Pressure ulcer 05/08/2016  . Diabetic foot infection ([place]) 05/04/2016  . Diabetic foot ([place]) 05/04/2016  . Osteomyelitis of ankle or foot, acute ([place]) 04/25/2016  . Type 2 diabetes mellitus with insulin therapy ([place]) 12/16/2015  . Amputated toe ([place]) 10/21/2015  . Diabetic foot ulcer ([place]) 10/12/2015  . Cellulitis of left foot 09/03/2015  . Leg edema, left 09/03/2015  . Acute renal failure ([place])  09/03/2015  . CAD (dz of distal, mid and proximal RCA with implantation of 3 overlapping drug-eluting stent,) 09/03/2015  . Chronic diastolic heart failure, NYHA class 2 ([place]) 09/03/2015  . Opacity of lung on imaging study 01/14/2015  . Angina pectoris associated with type 2 diabetes mellitus ([place]) 10/28/2014  . Hyperlipidemia 08/25/2014  . Chronic total occlusion of artery of the extremities ([place]) 04/08/2012  . Onychomycosis 02/01/2012  . Diabetes mellitus, insulin dependent (IDDM), controlled ([place]) 11/01/2011  . Occlusion and stenosis of carotid artery without mention of cerebral infarction 06/12/2011  . Aftercare following surgery of the circulatory system, Mariano Colon 06/12/2011  . GERD (gastroesophageal reflux disease) 05/08/2011  . Barrett's esophagus without dysplasia 05/08/2011  . PVD (peripheral vascular disease) ([place]) 02/01/2011  . HTN (hypertension) 02/01/2011  . Former tobacco use 02/01/2011    Past Surgical History:  Procedure Laterality Date  . AMPUTATION  11/05/2011   Procedure: AMPUTATION RAY;  Surgeon: Wylene Simmer, MD;  Location: [facility];  Service: Orthopedics;  Laterality: Right;  Amputation of Right 4&5th Toes  . AMPUTATION Left 11/26/2012   Procedure: AMPUTATION RAY;  Surgeon: Wylene Simmer, MD;  Location: [facility];  Service: Orthopedics;  Laterality: Left;  fourth ray amputation  . AMPUTATION Right 08/27/2014   Procedure: Transmetatarsal Amputation;  Surgeon: Newt Minion, MD;  Location: [facility];  Service: Orthopedics;  Laterality: Right;  . AMPUTATION Right 01/14/2015   Procedure: AMPUTATION BELOW KNEE;  Surgeon: Newt Minion, MD;  Location: [facility];  Service: Orthopedics;  Laterality: Right;  . AMPUTATION Left 10/21/2015   Procedure: Left Foot 5th Ray Amputation;  Surgeon: Newt Minion, MD;  Location: [facility];  Service: Orthopedics;  Laterality: Left;  . ANTERIOR FUSION CERVICAL SPINE  02/06/06   C4-5, C5-6, C6-7; SURGEON DR. MAX COHEN  . CARDIAC CATHETERIZATION  10/31/04   2009  .  CAROTID ENDARTERECTOMY  11/10/10  . CAROTID ENDARTERECTOMY Left 11/10/2010   Subtotal occlusion of left internal carotid artery with left hemispheric transient ischemic attacks.  . CARPAL TUNNEL RELEASE Right 10/21/2013   Procedure: RIGHT CARPAL TUNNEL RELEASE;  Surgeon: Wynonia Sours, MD;  Location: [facility];  Service: Orthopedics;  Laterality: Right;  . CHOLECYSTECTOMY    . COLONOSCOPY    . ESOPHAGEAL MANOMETRY Bilateral 07/19/2014   Procedure: ESOPHAGEAL MANOMETRY (EM);  Surgeon: Jerene Bears, MD;  Location: [facility];  Service: Gastroenterology;  Laterality: Bilateral;  . FEMORAL ARTERY STENT     x6  . FOOT SURGERY  04/25/2016    EXCISION BASE 5TH METATARSAL AND PARTIAL CUBOID LEFT FOOT  . HERNIA REPAIR     LEFT INGUINAL AND UMBILICAL REPAIRS  . I&D EXTREMITY Left 04/25/2016   Procedure: EXCISION BASE 5TH METATARSAL AND PARTIAL CUBOID LEFT FOOT;  Surgeon: Newt Minion, MD;  Location: [facility];  Service: Orthopedics;  Laterality: Left;  . JOINT REPLACEMENT Right 2001   Total  . LAMINECTOMY     X 3 LUMBAR AND X 2 CERVICAL SPINE OPERATIONS  . LAPAROSCOPIC CHOLECYSTECTOMY W/ CHOLANGIOGRAPHY  11/09/04   SURGEON DR. Luella Cook  . LEFT HEART CATHETERIZATION WITH CORONARY ANGIOGRAM N/A 10/29/2014   Procedure: LEFT HEART CATHETERIZATION WITH CORONARY ANGIOGRAM;  Surgeon: Laverda Page, MD;  [facility];  Service: Cardiovascular;  Laterality: N/A;  . LOWER EXTREMITY ANGIOGRAM N/A 03/19/2012   Procedure: LOWER EXTREMITY ANGIOGRAM;  Surgeon: Burnell Blanks, MD;  Location: [facility] CATH LAB;  Service: Cardiovascular;  Laterality: N/A;  . NECK SURGERY    . PENILE PROSTHESIS IMPLANT  08/14/05   INFRAPUBIC INSERTION OF INFLATABLE PENILE PROSTHESIS; SURGEON DR. Amalia Hailey  . PERCUTANEOUS CORONARY STENT INTERVENTION (PCI-S) Right 10/29/2014   Procedure: PERCUTANEOUS CORONARY STENT INTERVENTION (PCI-S);  Surgeon: Laverda Page, MD;  Location: [facility] CATH LAB;  Service:  Cardiovascular;  Laterality: Right;  . SHOULDER ARTHROSCOPY    . SPINE SURGERY    . TONSILLECTOMY    . TOTAL KNEE ARTHROPLASTY  07/2002   RIGHT KNEE ; SURGEON  DR. [name] ALSO HAD ARTHROSCOPIC RIGHT KNEE IN  10/2001  . ULNAR NERVE TRANSPOSITION Right 10/21/2013   Procedure: RIGHT ELBOW  ULNAR NERVE DECOMPRESSION;  Surgeon: Wynonia Sours, MD;  Location: [facility];  Service: Orthopedics;  Laterality: Right;       Home Medications    Prior to Admission medications   Medication Sig Start Date End Date Taking? Authorizing Provider  ALPRAZolam (XANAX) 0.25 MG tablet Take 0.25 mg by mouth daily. 08/22/15  Yes Historical Provider, MD  amLODipine-olmesartan (AZOR) 5-20 MG tablet Take 2 tablets by mouth every evening. 08/01/16  Yes Adrian Prows, MD  aspirin EC 81 MG tablet Take 81 mg by mouth daily.   Yes Historical Provider, MD  atorvastatin (LIPITOR) 40 MG tablet Take 40 mg by mouth daily.   Yes Historical Provider, MD  carvedilol (COREG) 25 MG tablet Take 1.5 tablets (37.5 mg total) by mouth 2 (two) times daily. 08/01/16  Yes Adrian Prows, MD  clobetasol (TEMOVATE) 0.05 % external solution Apply 1 application topically daily as needed (dry scalp).   Yes Historical Provider, MD  cloNIDine (CATAPRES - DOSED IN MG/24 HR) 0.3 mg/24hr patch Place 0.3 mg onto the skin every Monday. 08/20/16  Yes Historical Provider, MD  clopidogrel (PLAVIX) 75 MG tablet Take 75 mg by mouth daily.  03/28/12  Yes Historical Provider, MD  clotrimazole-betamethasone (LOTRISONE) cream Apply 1 application topically daily as needed (dry skin - beard).    Yes Historical Provider, MD  collagenase (SANTYL) ointment Apply topically daily. Patient taking differently: Apply 1 application topically daily. TO AFFECTED SITE(S) 09/06/15  Yes Lavina Hamman, MD  ezetimibe (ZETIA) 10 MG tablet Take 10 mg by mouth daily.   Yes Historical Provider, MD  fentaNYL (DURAGESIC - DOSED MCG/HR) 50 MCG/HR Place 1 patch (50 mcg total) onto  the skin every 3 (three) days. 05/10/16  Yes Orson Eva, MD  fluticasone (CUTIVATE) 0.05 % cream Apply 1 application topically 2 (two) times daily as needed (dry skin on face).    Yes Historical Provider, MD  glyBURIDE-metformin (GLUCOVANCE) 2.5-500 MG per tablet Take 2 tablets by mouth 2 (two) times daily with a meal.  03/05/12  Yes Historical Provider, MD  HYDROmorphone (DILAUDID) 2 MG tablet Take 1 tablet (2 mg total) by mouth at bedtime. Patient taking differently: Take  2 mg by mouth at bedtime as needed (for breakthrough pain).  05/10/16  Yes Orson Eva, MD  insulin glargine (LANTUS) 100 UNIT/ML injection Inject 0.6 mLs (60 Units total) into the skin at bedtime. 05/11/16  Yes Orson Eva, MD  Insulin Glulisine (APIDRA) 100 UNIT/ML Solostar Pen Inject 13 Units into the skin 3 (three) times daily with meals. Patient taking differently: Inject 10 Units into the skin 3 (three) times daily as needed (for CBG).  05/11/16  Yes Orson Eva, MD  isosorbide mononitrate (IMDUR) 120 MG 24 hr tablet Take 120 mg by mouth daily.   Yes Historical Provider, MD  Linaclotide Rolan Lipa) 145 MCG CAPS capsule Take 1 capsule (145 mcg total) by mouth daily as needed. Patient taking differently: Take 145 mcg by mouth daily as needed (constipation).  01/20/15  Yes Florencia Reasons, MD  Multiple Vitamin (MULTIVITAMIN WITH MINERALS) TABS tablet Take 1 tablet by mouth daily.   Yes Historical Provider, MD  nitroGLYCERIN (NITRO-BID) 2 % ointment Apply 0.75 inches topically daily as needed for chest pain.    Yes Historical Provider, MD  nitroGLYCERIN (NITROSTAT) 0.4 MG SL tablet Place 1 tablet (0.4 mg total) under the tongue every 5 (five) minutes as needed (as needed for esophageal spasm). Patient taking differently: Place 0.4 mg under the tongue every 5 (five) minutes as needed for chest pain.  08/09/14  Yes Jerene Bears, MD  omega-3 acid ethyl esters (LOVAZA) 1 G capsule Take 2 g by mouth 2 (two) times daily.   Yes Historical Provider, MD    ondansetron (ZOFRAN-ODT) 4 MG disintegrating tablet Take 4 mg by mouth 4 (four) times daily as needed for nausea.   Yes Historical Provider, MD  oxyCODONE-acetaminophen (PERCOCET/ROXICET) 5-325 MG tablet Take 1 tablet by mouth every 8 (eight) hours as needed. Patient taking differently: Take 1 tablet by mouth 3 (three) times daily.  05/10/16  Yes Orson Eva, MD  pantoprazole (PROTONIX) 40 MG tablet Take 1 tablet (40 mg total) by mouth daily before breakfast. 10/30/14  Yes Adrian Prows, MD  pregabalin (LYRICA) 100 MG capsule Take 1 capsule (100 mg total) by mouth 3 (three) times daily. 05/10/16  Yes Orson Eva, MD  Probiotic CAPS Take 2 capsules by mouth daily after breakfast.   Yes Historical Provider, MD  Propylene Glycol (SYSTANE BALANCE OP) Place 2-3 drops into both eyes daily as needed (for dry eyes).   Yes Historical Provider, MD  rOPINIRole (REQUIP) 2 MG tablet Take 2 mg by mouth at bedtime.    Yes Historical Provider, MD  spironolactone (ALDACTONE) 25 MG tablet Take 25 mg by mouth daily. 08/28/16  Yes Historical Provider, MD  Tamsulosin HCl (FLOMAX) 0.4 MG CAPS Take 0.4 mg by mouth daily.   Yes Historical Provider, MD  torsemide (DEMADEX) 20 MG tablet Take 20 mg by mouth every morning. MAY TAKE AN EXTRA 20 MG ONCE A DAY FOR EXCESSIVE FLUID RETENTION   Yes Historical Provider, MD    Family History Family History  Problem Relation Age of Onset  . Heart disease Father     Before age 11-  CAD, BPG  . Diabetes Father     Amputation  . Cancer Father     PROSTATE  . Hyperlipidemia Father   . Hypertension Father   . Heart attack Father     Triple BPG  . Varicose Veins Father   . Colon cancer Brother   . Cancer Brother     Colon  . Diabetes Brother   .  Heart disease Brother 26    A-Fib. Before age 57  . Hyperlipidemia Brother   . Hypertension Brother   . Cancer Sister     Breast  . Hyperlipidemia Sister   . Hypertension Sister   . Hypertension Son   . Arthritis      GRANDMOTHER  .  Hypertension      OTHER FAMILY MEMBERS    Social History Social History  Substance Use Topics  . Smoking status: Current Every Day Smoker    Packs/day: 0.50    Years: 35.00    Types: E-cigarettes    Last attempt to quit: 10/28/2011  . Smokeless tobacco: Never Used     Comment: VAPOR CIGARETTES  . Alcohol use No     Comment: "not in a long time"     Allergies   Ivp dye [iodinated diagnostic agents] and Adhesive [tape]   Review of Systems Review of Systems  Constitutional: Positive for chills and fever. Negative for diaphoresis.  Respiratory: Positive for chest tightness. Negative for cough and shortness of breath.   Cardiovascular: Positive for chest pain.  Gastrointestinal: Negative for abdominal pain and diarrhea.  Musculoskeletal: Positive for back pain.  Neurological: Negative for syncope and weakness.  Psychiatric/Behavioral: Negative for agitation.     Physical Exam Updated Vital Signs BP (!) 124/49   Pulse 71   Temp 102.5 F (39.2 C) (Oral)   Resp 19   Ht 6' 2"  (1.88 m)   Wt 250 lb (113.4 kg)   SpO2 95%   BMI 32.10 kg/m   Physical Exam  Constitutional: He is oriented to person, place, and time. He appears well-nourished.  HENT:  Head: Normocephalic.  Eyes: Conjunctivae are normal.  Cardiovascular: Normal rate and regular rhythm.   Pulmonary/Chest: Effort normal. No respiratory distress. He has no wheezes.  Abdominal: Soft. He exhibits no distension.  Musculoskeletal:  R BKA, no infection,  L partial foot, no infection.   Back with scar to L and C spine, pain in L spine on palp. No signs of infection.  Neurological: He is oriented to person, place, and time.  Skin: Skin is warm and dry. He is not diaphoretic.  Psychiatric: He has a normal mood and affect. His behavior is normal.     ED Treatments / Results  Labs (all labs ordered are listed, but only abnormal results are displayed) Labs Reviewed  BASIC METABOLIC PANEL - Abnormal; Notable for  the following:       Result Value   Glucose, Bld 298 (*)    Creatinine, Ser 1.37 (*)    Calcium 8.8 (*)    GFR calc non Af Amer 54 (*)    All other components within normal limits  CBC - Abnormal; Notable for the following:    RBC 4.03 (*)    Hemoglobin 11.2 (*)    HCT 31.9 (*)    All other components within normal limits  SEDIMENTATION RATE - Abnormal; Notable for the following:    Sed Rate 35 (*)    All other components within normal limits  C-REACTIVE PROTEIN - Abnormal; Notable for the following:    CRP 1.2 (*)    All other components within normal limits  CBG MONITORING, ED - Abnormal; Notable for the following:    Glucose-Capillary 278 (*)    All other components within normal limits  I-STAT CG4 LACTIC ACID, ED - Abnormal; Notable for the following:    Lactic Acid, Venous 2.13 (*)    All other  components within normal limits  INFLUENZA PANEL BY PCR (TYPE A & B, H1N1)  I-STAT TROPOININ, ED  I-STAT TROPOININ, ED    EKG  EKG Interpretation  Date/Time:  Monday September 03 2016 17:51:33 EST Ventricular Rate:  93 PR Interval:    QRS Duration: 135 QT Interval:  381 QTC Calculation: 474 R Axis:   -69 Text Interpretation:  Sinus rhythm RBBB and LAFB Left ventricular hypertrophy Anterior Q waves, possibly due to LVH No significant change since last tracing Confirmed by Gerald Leitz ([id number]) on 09/03/2016 7:27:21 PM       Radiology Dg Chest 2 View  Result Date: 09/03/2016 CLINICAL DATA:  Substernal chest pain and chills. EXAM: CHEST  2 VIEW COMPARISON:  07/30/2016 FINDINGS: Atherosclerotic aortic arch. Mild enlargement of the cardiopericardial silhouette, without edema. Cervical plate and screw fixator with posterolateral rod and facet screw fixation in the lower cervical spine. Thoracic spondylosis.  No pleural effusion identified. The lungs appear clear. IMPRESSION: 1. Mild enlargement of the cardiopericardial silhouette, without edema. The lungs appear clear. 2.  Atherosclerotic aortic arch. Electronically Signed   By: Van Clines M.D.   On: 09/03/2016 18:41   Mr Lumbar Spine W Wo Contrast  Result Date: 09/03/2016 CLINICAL DATA:  Back pain.  Prior lumbar surgery. EXAM: MRI LUMBAR SPINE WITHOUT AND WITH CONTRAST TECHNIQUE: Multiplanar and multiecho pulse sequences of the lumbar spine were obtained without and with intravenous contrast. CONTRAST:  47m MULTIHANCE GADOBENATE DIMEGLUMINE 529 MG/ML IV SOLN COMPARISON:  R spine MRI 10/03/2013 FINDINGS: Segmentation:  Normal Alignment:  Normal Vertebrae: There is posterior spinal fusion extending from L4-S1 with disc spacers at L4-5 and L5-S1. No acute compression fracture, discitis-osteomyelitis, facet edema or other focal marrow lesion. No epidural collection. Conus medullaris: Extends to the L1-L2 level and appears normal. Paraspinal and other soft tissues: The visualized aorta, IVC and iliac vessels are normal. The visualized retroperitoneal organs and paraspinal soft tissues are normal. Disc levels: T10- L1: Evaluated on sagittal images only. Normal disc space and facets. No spinal canal or neuroforaminal stenosis. L1-L2: Disc desiccation with left subarticular disc protrusion narrowing the left lateral recess. No central spinal canal stenosis or neural foraminal stenosis. L2-L3: Diffuse disc bulge and mild facet hypertrophy. Mild narrowing of the spinal canal. No neural foraminal stenosis. L3-L4: There are small T2 hyperintense structures arising from the medial aspects of both facet joints, measuring approximately 2 x 749mon the right and 4 x 7 mm on the left. These cause narrowing of the transverse diameter of the thecal sac. Additionally, there is moderate facet hypertrophy and a diffuse disc bulge. These factors combine to create severe spinal canal stenosis. No neural foraminal stenosis. L4-L5: Postfusion and posterior decompression changes without residual spinal canal stenosis. Moderate right and mild left  foraminal narrowing. L5-S1: Postfusion and posterior decompression changes without residual spinal canal stenosis. Moderate bilateral foraminal narrowing. IMPRESSION: 1. Progression of spinal canal stenosis at L3-L4, now severe, caused by combination of disc bulge, facet arthrosis and bilateral synovial cysts projecting medially from both facet joints. 2. Unchanged moderate bilateral L5-S1 neural foraminal stenosis. 3. Unchanged small disc protrusion narrowing the left lateral recess at L1-L2. Electronically Signed   By: KeUlyses Jarred.D.   On: 09/03/2016 23:06    Procedures Procedures (including critical care time)  Medications Ordered in ED Medications  HYDROcodone-acetaminophen (NORCO/VICODIN) 5-325 MG per tablet 1 tablet (1 tablet Oral Given 09/03/16 2038)  pregabalin (LYRICA) capsule 100 mg (100 mg Oral Given 09/03/16 2038)  LORazepam (ATIVAN) injection 1 mg (1 mg Intravenous Given 09/03/16 2104)  gadobenate dimeglumine (MULTIHANCE) injection 20 mL (20 mLs Intravenous Contrast Given 09/03/16 2215)  sodium chloride 0.9 % bolus 500 mL (500 mLs Intravenous New Bag/Given 09/03/16 2326)     Initial Impression / Assessment and Plan / ED Course  I have reviewed the triage vital signs and the nursing notes.  Pertinent labs & imaging results that were available during my care of the patient were reviewed by me and considered in my medical decision making (see chart for details).  Clinical Course     Pt is a 60 yo here with fever and chills, associated chest and back pain.  Cmplicated PMH sig for CAD stents, multiple back surgeries, diabetes, BKA, htn, hld.  Baseline physical exam. Chatty, well appearing gentleman. Here with family. CXR clear. No evidence of skini infection  Will pursue infection as likely source of fever and acheyness.  Lijkely early viral infection.  HOwever wieth history of back surgery, need to rule out epidural abscess.   11:51 PM MRI showed no evidence of epidural  abscess. Patient's CRP and ESR lower than they have been in the past. Patient's vital signs remained normal. Patient's lactic only mildly elevated at 2.13, likely represents mild infection rather than sepsis. Patient has normal physical exam. Feels baseline.  I think he likely has a viral syndrome. We'll do delta troponin since we are waiting for MRI. Chest pain did not sound at all ischemic in nature. (unlikely to have febrile illness AND ischemic heart disease).  Patient has follow-up with his cardiologist next week. We'll have him follow-up with his PCP this week. He is comfortable, normal vital signs, taking by mouth and back to baseline.  Talked to him about MRI results- he said he already knows he needs surgery. He will follow up with his spine physicians.    Discussed with family that with two troponins, not compeltely sure it is not cardiac, so he should monitor symptoms, follow up with card and PCP.  No source of infection requiring antibiotics. Unsure what we would do if he were admitted. Strict return precautions expressed and patient and family understood.    Final Clinical Impressions(s) / ED Diagnoses   Final diagnoses:  Lumbar pain    New Prescriptions New Prescriptions   No medications on file     Mykeisha Dysert Julio Alm, MD 09/03/16 2326    Elexia Friedt Julio Alm, MD 09/03/16 [place], MD 09/03/16 2351

## 2016-09-03 NOTE — ED Notes (Signed)
Patient transported to X-ray 

## 2016-09-03 NOTE — ED Notes (Signed)
ED Provider at bedside. 

## 2016-09-03 NOTE — ED Notes (Signed)
Pt transported to MRI 

## 2016-09-03 NOTE — ED Notes (Signed)
Patient transported to MRI 

## 2016-09-04 NOTE — Discharge Instructions (Signed)
We are unsure what caused your chills and fever today. Your MRI shows no infection, but as wee discusssed (and you said you already knew!) there is worsening of your stenosis and you need to see your spine physician ASAP. Please retuern with ANY chest pain, or anything concerning.

## 2016-09-19 ENCOUNTER — Encounter (HOSPITAL_COMMUNITY): Payer: Self-pay | Admitting: *Deleted

## 2016-09-19 ENCOUNTER — Emergency Department (HOSPITAL_COMMUNITY)
Admission: EM | Admit: 2016-09-19 | Discharge: 2016-09-19 | Disposition: A | Discharge status: Home / Self Care | Payer: BLUE CROSS/BLUE SHIELD | Attending: Emergency Medicine | Admitting: Emergency Medicine

## 2016-09-19 DIAGNOSIS — L89152 Pressure ulcer of sacral region, stage 2: Secondary | ICD-10-CM | POA: Diagnosis not present

## 2016-09-19 DIAGNOSIS — E1142 Type 2 diabetes mellitus with diabetic polyneuropathy: Secondary | ICD-10-CM | POA: Diagnosis not present

## 2016-09-19 DIAGNOSIS — Z96651 Presence of right artificial knee joint: Secondary | ICD-10-CM | POA: Insufficient documentation

## 2016-09-19 DIAGNOSIS — I2510 Atherosclerotic heart disease of native coronary artery without angina pectoris: Secondary | ICD-10-CM | POA: Insufficient documentation

## 2016-09-19 DIAGNOSIS — Z79899 Other long term (current) drug therapy: Secondary | ICD-10-CM | POA: Insufficient documentation

## 2016-09-19 DIAGNOSIS — F17290 Nicotine dependence, other tobacco product, uncomplicated: Secondary | ICD-10-CM | POA: Diagnosis not present

## 2016-09-19 DIAGNOSIS — I5032 Chronic diastolic (congestive) heart failure: Secondary | ICD-10-CM | POA: Diagnosis not present

## 2016-09-19 DIAGNOSIS — I110 Hypertensive heart disease with heart failure: Secondary | ICD-10-CM | POA: Diagnosis not present

## 2016-09-19 DIAGNOSIS — Z7982 Long term (current) use of aspirin: Secondary | ICD-10-CM | POA: Insufficient documentation

## 2016-09-19 DIAGNOSIS — J449 Chronic obstructive pulmonary disease, unspecified: Secondary | ICD-10-CM | POA: Diagnosis not present

## 2016-09-19 DIAGNOSIS — Z794 Long term (current) use of insulin: Secondary | ICD-10-CM | POA: Insufficient documentation

## 2016-09-19 DIAGNOSIS — L89319 Pressure ulcer of right buttock, unspecified stage: Secondary | ICD-10-CM | POA: Diagnosis present

## 2016-09-19 LAB — COMPREHENSIVE METABOLIC PANEL
ALT: 13 U/L — ABNORMAL LOW (ref 17–63)
AST: 17 U/L (ref 15–41)
Albumin: 3.8 g/dL (ref 3.5–5.0)
Alkaline Phosphatase: 111 U/L (ref 38–126)
Anion gap: 11 (ref 5–15)
BUN: 18 mg/dL (ref 6–20)
CO2: 24 mmol/L (ref 22–32)
Calcium: 9.3 mg/dL (ref 8.9–10.3)
Chloride: 102 mmol/L (ref 101–111)
Creatinine, Ser: 1.45 mg/dL — ABNORMAL HIGH (ref 0.61–1.24)
GFR calc Af Amer: 58 mL/min — ABNORMAL LOW (ref >60)
GFR calc non Af Amer: 50 mL/min — ABNORMAL LOW (ref >60)
Glucose, Bld: 176 mg/dL — ABNORMAL HIGH (ref 65–99)
Potassium: 4.3 mmol/L (ref 3.5–5.1)
Sodium: 137 mmol/L (ref 135–145)
Total Bilirubin: 0.5 mg/dL (ref 0.3–1.2)
Total Protein: 6.8 g/dL (ref 6.5–8.1)

## 2016-09-19 LAB — CBC
HCT: 33.1 % — ABNORMAL LOW (ref 39.0–52.0)
Hemoglobin: 11.2 g/dL — ABNORMAL LOW (ref 13.0–17.0)
MCH: 27.2 pg (ref 26.0–34.0)
MCHC: 33.8 g/dL (ref 30.0–36.0)
MCV: 80.3 fL (ref 78.0–100.0)
Platelets: 294 10*3/uL (ref 150–400)
RBC: 4.12 MIL/uL — ABNORMAL LOW (ref 4.22–5.81)
RDW: 13.8 % (ref 11.5–15.5)
WBC: 9.5 10*3/uL (ref 4.0–10.5)

## 2016-09-19 LAB — URINALYSIS, ROUTINE W REFLEX MICROSCOPIC
Bacteria, UA: NONE SEEN
Bilirubin Urine: NEGATIVE
Glucose, UA: >=500 mg/dL — AB
Hgb urine dipstick: NEGATIVE
Ketones, ur: NEGATIVE mg/dL
Leukocytes, UA: NEGATIVE
Nitrite: NEGATIVE
Protein, ur: 100 mg/dL — AB
Specific Gravity, Urine: 1.018 (ref 1.005–1.030)
pH: 5 (ref 5.0–8.0)

## 2016-09-19 LAB — LIPASE, BLOOD: Lipase: 26 U/L (ref 11–51)

## 2016-09-19 LAB — I-STAT CG4 LACTIC ACID, ED
Lactic Acid, Venous: 2.38 mmol/L (ref 0.5–1.9)
Lactic Acid, Venous: 1.93 mmol/L (ref 0.5–1.9)

## 2016-09-19 MED ORDER — DOXYCYCLINE HYCLATE 100 MG PO CAPS: 100.0000 mg | ORAL_CAPSULE | Freq: Two times a day (BID) | ORAL | 0 refills | Status: DC

## 2016-09-19 NOTE — Discharge Instructions (Signed)
You were seen today for concerns for drainage over your wounds on her bottom. Your lab work is reassuring. You may have the beginnings of infection but there is no significant infection noted. You'll be started on doxycycline. You need to see your primary doctor for recheck on Thursday or Friday. If you have a fever or new or worsening symptoms she needs to be reevaluated.

## 2016-09-19 NOTE — ED Triage Notes (Signed)
The npt is here c/o buttock decubitus ulcers  He saw his regular doctor earlier today and he was told to come here because the ulcers have an odor. He has been nin the hospital numerus times and for 3-4 months he has had these ulcers

## 2016-09-19 NOTE — ED Provider Notes (Signed)
[facility] DEPT Provider Note   CSN: [id number] Arrival date & time: 09/19/16  6222  By signing my name below, I, [name], attest that this documentation has been prepared under the direction and in the presence of Harold Hacker, MD. Electronically Signed: Reola [name], ED Scribe. 09/19/16. 4:02 AM.  History   Chief Complaint Chief Complaint  Patient presents with  . Skin Ulcer   The history is provided by the patient. No language interpreter was used.    HPI Comments: [name] is a 61 y.o. male with a h/o DM, CHF, CAD, and HTN, who presents to the Emergency Department complaining of gradually worsening, bilateral buttock wounds w/ associated pain to the area, onset over one year ago. He notes that his pain waxes and wanes, but tonight his rates it as 6/10. Patient reports that he has had a foul odor. He additionally states that he was experiencing chills four days ago, however, this has since resolved and he has had none since. Denies fever. He went to his primary care office yesterday and was seen by the nurse, and at that time he was referred into the ED d/t worsening smell of the area. He has been using Fentanyl patches at home without relief of his pain. He denies chest pain, shortness of breath, or any other associated symptoms.   Patient with extensive history of peripheral vascular disease and osteomyelitis status post amputation.  Past Medical History:  Diagnosis Date  . Barrett's esophagus   . Carotid artery occlusion 11/10/10   LEFT CAROTID ENDARTERECTOMY  . CHF (congestive heart failure) ([place])   . Complication of anesthesia    BP WENT UP AT [facility] "  . COPD (chronic obstructive pulmonary disease) ([place])   . Coronary artery disease   . Diabetes mellitus   . Diverticulosis of colon (without mention of hemorrhage)   . DJD (degenerative joint disease)   . Emphysema of lung ([place])   . Fatty liver   . Full dentures   . GERD (gastroesophageal  reflux disease)   . H/O Doppler ultrasound 06/28/2012   Lower venous duplex left-- findings consistent with acute deep vein thrombosis involving the personal vein of the left lower extremity. No evidence of Baker's cyst on the left. Duplex scan of the left lower extremity arteries reveal adequate blood flow.   . H/O hiatal hernia   . History of cardiovascular stress test 03/23/2011   ejection fraction measured 51%, with an end-diastolic volume of 979 ml and an end-systolic volume of 81 ml. no evidence of myocardial ischemia.  Marland Kitchen History of echocardiogram 08/22/2011   Mildly hypertrophic left ventricle with normal systolic function and mild diastolic dysfunction. mildly dilated left atrium. no significant valvular abnormalities. no pericardial effusion no change from previous study 10/2010. the ejection fraction =>55%  . History of Holter monitoring 02/14/2012   Scan quality is fair due to baseline artifact. Predominant rhythm is Sinus. Veentricular Ectopy was noted in single beat form. The total recording time was 24:00 hours. total beats =[id number]. the total time analyzed was 23:58 hours. Heart  Max = 102 at 1:18:22, Min= 52 at 9:56:19 am., Avg= 70. There were 0 pause(s) > 2.5 seconds. the longest pause = 0.00 sec. at 1:51 pm(1)  . Hyperlipidemia   . Hypertension   . Neuromuscular disorder ([place])    peripheral neuropathy  . OSA (obstructive sleep apnea) 12/26/08   AHI of 6.3/hr overall, 32.7/hr during REM sleep and an RDI of 24.8/hr and  50.9/hr.  . Osteomyelitis ([place])   . Osteomyelitis ([place])    left 5th metatarsal  . PAD (peripheral artery disease) ([place])    Distal aortogram June 2012. Atherectomy left popliteal artery July 2012.   . Sleep apnea    has not used CPAP in e years - took self off  . Slurred speech    AS PER WIFE IN D/C NOTE 11/10/10  . Substernal chest pain   . Visual color changes    LEFT EYE; BUT NOT TOTAL BLINDNESS  . Wears glasses    Patient Active Problem List   Diagnosis Date  Noted  . Hypertension 07/30/2016  . History of right below knee amputation ([place]) 07/12/2016  . Poorly controlled type 2 diabetes mellitus with peripheral neuropathy ([place]) 05/09/2016  . Acute osteomyelitis of left foot ([place])   . Pressure ulcer 05/08/2016  . Diabetic foot infection ([place]) 05/04/2016  . Diabetic foot ([place]) 05/04/2016  . Osteomyelitis of ankle or foot, acute ([place]) 04/25/2016  . Type 2 diabetes mellitus with insulin therapy ([place]) 12/16/2015  . Amputated toe ([place]) 10/21/2015  . Diabetic foot ulcer ([place]) 10/12/2015  . Cellulitis of left foot 09/03/2015  . Leg edema, left 09/03/2015  . Acute renal failure ([place]) 09/03/2015  . CAD (dz of distal, mid and proximal RCA with implantation of 3 overlapping drug-eluting stent,) 09/03/2015  . Chronic diastolic heart failure, NYHA class 2 ([place]) 09/03/2015  . Opacity of lung on imaging study 01/14/2015  . Angina pectoris associated with type 2 diabetes mellitus ([place]) 10/28/2014  . Hyperlipidemia 08/25/2014  . Chronic total occlusion of artery of the extremities ([place]) 04/08/2012  . Onychomycosis 02/01/2012  . Diabetes mellitus, insulin dependent (IDDM), controlled ([place]) 11/01/2011  . Occlusion and stenosis of carotid artery without mention of cerebral infarction 06/12/2011  . Aftercare following surgery of the circulatory system, [place] 06/12/2011  . GERD (gastroesophageal reflux disease) 05/08/2011  . Barrett's esophagus without dysplasia 05/08/2011  . PVD (peripheral vascular disease) ([place]) 02/01/2011  . HTN (hypertension) 02/01/2011  . Former tobacco use 02/01/2011   Past Surgical History:  Procedure Laterality Date  . AMPUTATION  11/05/2011   Procedure: AMPUTATION RAY;  Surgeon: Wylene Simmer, MD;  Location: [facility];  Service: Orthopedics;  Laterality: Right;  Amputation of Right 4&5th Toes  . AMPUTATION Left 11/26/2012   Procedure: AMPUTATION RAY;  Surgeon: Wylene Simmer, MD;  Location: [facility];  Service: Orthopedics;  Laterality: Left;   fourth ray amputation  . AMPUTATION Right 08/27/2014   Procedure: Transmetatarsal Amputation;  Surgeon: Newt Minion, MD;  Location: [facility];  Service: Orthopedics;  Laterality: Right;  . AMPUTATION Right 01/14/2015   Procedure: AMPUTATION BELOW KNEE;  Surgeon: Newt Minion, MD;  Location: [facility];  Service: Orthopedics;  Laterality: Right;  . AMPUTATION Left 10/21/2015   Procedure: Left Foot 5th Ray Amputation;  Surgeon: Newt Minion, MD;  Location: [facility];  Service: Orthopedics;  Laterality: Left;  . ANTERIOR FUSION CERVICAL SPINE  02/06/06   C4-5, C5-6, C6-7; SURGEON DR. MAX COHEN  . CARDIAC CATHETERIZATION  10/31/04   2009  . CAROTID ENDARTERECTOMY  11/10/10  . CAROTID ENDARTERECTOMY Left 11/10/2010   Subtotal occlusion of left internal carotid artery with left hemispheric transient ischemic attacks.  . CARPAL TUNNEL RELEASE Right 10/21/2013   Procedure: RIGHT CARPAL TUNNEL RELEASE;  Surgeon: Wynonia Sours, MD;  Location: [facility];  Service: Orthopedics;  Laterality: Right;  . CHOLECYSTECTOMY    . COLONOSCOPY    .  ESOPHAGEAL MANOMETRY Bilateral 07/19/2014   Procedure: ESOPHAGEAL MANOMETRY (EM);  Surgeon: Jerene Bears, MD;  Location: [facility];  Service: Gastroenterology;  Laterality: Bilateral;  . FEMORAL ARTERY STENT     x6  . FOOT SURGERY  04/25/2016    EXCISION BASE 5TH METATARSAL AND PARTIAL CUBOID LEFT FOOT  . HERNIA REPAIR     LEFT INGUINAL AND UMBILICAL REPAIRS  . I&D EXTREMITY Left 04/25/2016   Procedure: EXCISION BASE 5TH METATARSAL AND PARTIAL CUBOID LEFT FOOT;  Surgeon: Newt Minion, MD;  Location: [facility];  Service: Orthopedics;  Laterality: Left;  . JOINT REPLACEMENT Right 2001   Total  . LAMINECTOMY     X 3 LUMBAR AND X 2 CERVICAL SPINE OPERATIONS  . LAPAROSCOPIC CHOLECYSTECTOMY W/ CHOLANGIOGRAPHY  11/09/04   SURGEON DR. Luella Cook  . LEFT HEART CATHETERIZATION WITH CORONARY ANGIOGRAM N/A 10/29/2014   Procedure: LEFT HEART CATHETERIZATION WITH CORONARY  ANGIOGRAM;  Surgeon: Laverda Page, MD;  Location: [facility] CATH LAB;  Service: Cardiovascular;  Laterality: N/A;  . LOWER EXTREMITY ANGIOGRAM N/A 03/19/2012   Procedure: LOWER EXTREMITY ANGIOGRAM;  Surgeon: Burnell Blanks, MD;  Location: [facility] CATH LAB;  Service: Cardiovascular;  Laterality: N/A;  . NECK SURGERY    . PENILE PROSTHESIS IMPLANT  08/14/05   INFRAPUBIC INSERTION OF INFLATABLE PENILE PROSTHESIS; SURGEON DR. Amalia Hailey  . PERCUTANEOUS CORONARY STENT INTERVENTION (PCI-S) Right 10/29/2014   Procedure: PERCUTANEOUS CORONARY STENT INTERVENTION (PCI-S);  Surgeon: Laverda Page, MD;  Location[facility];  Service: Cardiovascular;  Laterality: Right;  . SHOULDER ARTHROSCOPY    . SPINE SURGERY    . TONSILLECTOMY    . TOTAL KNEE ARTHROPLASTY  07/2002   RIGHT KNEE ; SURGEON  DR. [name] ALSO HAD ARTHROSCOPIC RIGHT KNEE IN  10/2001  . ULNAR NERVE TRANSPOSITION Right 10/21/2013   Procedure: RIGHT ELBOW  ULNAR NERVE DECOMPRESSION;  Surgeon: Wynonia Sours, MD;  Location: [facility];  Service: Orthopedics;  Laterality: Right;    Home Medications    Prior to Admission medications   Medication Sig Start Date End Date Taking? Authorizing Provider  ALPRAZolam (XANAX) 0.25 MG tablet Take 0.25 mg by mouth daily. 08/22/15   Historical Provider, MD  amLODipine-olmesartan (AZOR) 5-20 MG tablet Take 2 tablets by mouth every evening. 08/01/16   Adrian Prows, MD  aspirin EC 81 MG tablet Take 81 mg by mouth daily.    Historical Provider, MD  atorvastatin (LIPITOR) 40 MG tablet Take 40 mg by mouth daily.    Historical Provider, MD  carvedilol (COREG) 25 MG tablet Take 1.5 tablets (37.5 mg total) by mouth 2 (two) times daily. 08/01/16   Adrian Prows, MD  clobetasol (TEMOVATE) 0.05 % external solution Apply 1 application topically daily as needed (dry scalp).    Historical Provider, MD  cloNIDine (CATAPRES - DOSED IN MG/24 HR) 0.3 mg/24hr patch Place 0.3 mg onto the skin every Monday. 08/20/16    Historical Provider, MD  clopidogrel (PLAVIX) 75 MG tablet Take 75 mg by mouth daily.  03/28/12   Historical Provider, MD  clotrimazole-betamethasone (LOTRISONE) cream Apply 1 application topically daily as needed (dry skin - beard).     Historical Provider, MD  collagenase (SANTYL) ointment Apply topically daily. Patient taking differently: Apply 1 application topically daily. TO AFFECTED SITE(S) 09/06/15   Lavina Hamman, MD  doxycycline (VIBRAMYCIN) 100 MG capsule Take 1 capsule (100 mg total) by mouth 2 (two) times daily. 09/19/16   Harold Hacker, MD  ezetimibe (ZETIA) 10 MG tablet Take 10 mg by mouth daily.    Historical Provider, MD  fentaNYL (DURAGESIC - DOSED MCG/HR) 50 MCG/HR Place 1 patch (50 mcg total) onto the skin every 3 (three) days. 05/10/16   Orson Eva, MD  fluticasone (CUTIVATE) 0.05 % cream Apply 1 application topically 2 (two) times daily as needed (dry skin on face).     Historical Provider, MD  glyBURIDE-metformin (GLUCOVANCE) 2.5-500 MG per tablet Take 2 tablets by mouth 2 (two) times daily with a meal.  03/05/12   Historical Provider, MD  HYDROmorphone (DILAUDID) 2 MG tablet Take 1 tablet (2 mg total) by mouth at bedtime. Patient taking differently: Take 2 mg by mouth at bedtime as needed (for breakthrough pain).  05/10/16   Orson Eva, MD  insulin glargine (LANTUS) 100 UNIT/ML injection Inject 0.6 mLs (60 Units total) into the skin at bedtime. 05/11/16   Orson Eva, MD  Insulin Glulisine (APIDRA) 100 UNIT/ML Solostar Pen Inject 13 Units into the skin 3 (three) times daily with meals. Patient taking differently: Inject 10 Units into the skin 3 (three) times daily as needed (for CBG).  05/11/16   Orson Eva, MD  isosorbide mononitrate (IMDUR) 120 MG 24 hr tablet Take 120 mg by mouth daily.    Historical Provider, MD  Linaclotide Rolan Lipa) 145 MCG CAPS capsule Take 1 capsule (145 mcg total) by mouth daily as needed. Patient taking differently: Take 145 mcg by mouth daily as needed  (constipation).  01/20/15   Florencia Reasons, MD  Multiple Vitamin (MULTIVITAMIN WITH MINERALS) TABS tablet Take 1 tablet by mouth daily.    Historical Provider, MD  nitroGLYCERIN (NITRO-BID) 2 % ointment Apply 0.75 inches topically daily as needed for chest pain.     Historical Provider, MD  nitroGLYCERIN (NITROSTAT) 0.4 MG SL tablet Place 1 tablet (0.4 mg total) under the tongue every 5 (five) minutes as needed (as needed for esophageal spasm). Patient taking differently: Place 0.4 mg under the tongue every 5 (five) minutes as needed for chest pain.  08/09/14   Jerene Bears, MD  omega-3 acid ethyl esters (LOVAZA) 1 G capsule Take 2 g by mouth 2 (two) times daily.    Historical Provider, MD  ondansetron (ZOFRAN-ODT) 4 MG disintegrating tablet Take 4 mg by mouth 4 (four) times daily as needed for nausea.    Historical Provider, MD  oxyCODONE-acetaminophen (PERCOCET/ROXICET) 5-325 MG tablet Take 1 tablet by mouth every 8 (eight) hours as needed. Patient taking differently: Take 1 tablet by mouth 3 (three) times daily.  05/10/16   Orson Eva, MD  pantoprazole (PROTONIX) 40 MG tablet Take 1 tablet (40 mg total) by mouth daily before breakfast. 10/30/14   Adrian Prows, MD  pregabalin (LYRICA) 100 MG capsule Take 1 capsule (100 mg total) by mouth 3 (three) times daily. 05/10/16   Orson Eva, MD  Probiotic CAPS Take 2 capsules by mouth daily after breakfast.    Historical Provider, MD  Propylene Glycol (SYSTANE BALANCE OP) Place 2-3 drops into both eyes daily as needed (for dry eyes).    Historical Provider, MD  rOPINIRole (REQUIP) 2 MG tablet Take 2 mg by mouth at bedtime.     Historical Provider, MD  spironolactone (ALDACTONE) 25 MG tablet Take 25 mg by mouth daily. 08/28/16   Historical Provider, MD  Tamsulosin HCl (FLOMAX) 0.4 MG CAPS Take 0.4 mg by mouth daily.    Historical Provider, MD  torsemide (DEMADEX) 20 MG tablet Take 20 mg by mouth  every morning. MAY TAKE AN EXTRA 20 MG ONCE A DAY FOR EXCESSIVE FLUID RETENTION     Historical Provider, MD   Family History Family History  Problem Relation Age of Onset  . Heart disease Father     Before age 42-  CAD, BPG  . Diabetes Father     Amputation  . Cancer Father     PROSTATE  . Hyperlipidemia Father   . Hypertension Father   . Heart attack Father     Triple BPG  . Varicose Veins Father   . Colon cancer Brother   . Cancer Brother     Colon  . Diabetes Brother   . Heart disease Brother 51    A-Fib. Before age 28  . Hyperlipidemia Brother   . Hypertension Brother   . Cancer Sister     Breast  . Hyperlipidemia Sister   . Hypertension Sister   . Hypertension Son   . Arthritis      GRANDMOTHER  . Hypertension      OTHER FAMILY MEMBERS   Social History Social History  Substance Use Topics  . Smoking status: Current Every Day Smoker    Packs/day: 0.50    Years: 35.00    Types: E-cigarettes    Last attempt to quit: 10/28/2011  . Smokeless tobacco: Never Used     Comment: VAPOR CIGARETTES  . Alcohol use No     Comment: "not in a long time"   Allergies   Ivp dye [iodinated diagnostic agents] and Adhesive [tape]  Review of Systems Review of Systems  Constitutional: Positive for chills. Negative for fever.  Respiratory: Negative for shortness of breath.   Cardiovascular: Negative for chest pain.  Gastrointestinal: Negative for abdominal pain, nausea and vomiting.  Musculoskeletal: Positive for myalgias.  Skin: Positive for wound.  All other systems reviewed and are negative.  Physical Exam Updated Vital Signs BP 148/78   Pulse 69   Temp 98.4 F (36.9 C) (Oral)   Resp 18   Ht 6\' 2"  (1.88 m)   Wt 250 lb (113.4 kg)   SpO2 99%   BMI 32.10 kg/m   Physical Exam  Constitutional: He is oriented to person, place, and time. No distress.  Chronically ill-appearing, no acute distress  HENT:  Head: Normocephalic and atraumatic.  Cardiovascular: Normal rate, regular rhythm and normal heart sounds.   No murmur heard. Pulmonary/Chest:  Effort normal and breath sounds normal. No respiratory distress. He has no wheezes.  Abdominal: Soft. There is no tenderness.  Musculoskeletal:  Right-sided BKA  Neurological: He is alert and oriented to person, place, and time.  Skin: Skin is warm and dry.  2 very small stage II decubitus ulcers noted over the right buttock just adjacent to the gluteal cleft, one is approximately 3 cm x 1 cm, the other is 1 cm x 2 cm, no foul-smelling drainage, clean margins, diffuse erythema over the buttock which is blanching, no crepitus, no significant warmth  Psychiatric: He has a normal mood and affect.  Nursing note and vitals reviewed.  ED Treatments / Results  DIAGNOSTIC STUDIES: Oxygen Saturation is 99% on RA, normal by my interpretation.   Labs (all labs ordered are listed, but only abnormal results are displayed) Labs Reviewed  COMPREHENSIVE METABOLIC PANEL - Abnormal; Notable for the following:       Result Value   Glucose, Bld 176 (*)    Creatinine, Ser 1.45 (*)    ALT 13 (*)    GFR calc  non Af Amer 50 (*)    GFR calc Af Amer 58 (*)    All other components within normal limits  CBC - Abnormal; Notable for the following:    RBC 4.12 (*)    Hemoglobin 11.2 (*)    HCT 33.1 (*)    All other components within normal limits  URINALYSIS, ROUTINE W REFLEX MICROSCOPIC - Abnormal; Notable for the following:    Glucose, UA >=500 (*)    Protein, ur 100 (*)    Squamous Epithelial / LPF 0-5 (*)    All other components within normal limits  I-STAT CG4 LACTIC ACID, ED - Abnormal; Notable for the following:    Lactic Acid, Venous 2.38 (*)    All other components within normal limits  I-STAT CG4 LACTIC ACID, ED - Abnormal; Notable for the following:    Lactic Acid, Venous 1.93 (*)    All other components within normal limits  LIPASE, BLOOD   EKG  EKG Interpretation None      Radiology No results found.  Procedures Procedures  Medications Ordered in ED Medications - No data to  display  Initial Impression / Assessment and Plan / ED Course  I have reviewed the triage vital signs and the nursing notes.  Pertinent labs & imaging results that were available during my care of the patient were reviewed by me and considered in my medical decision making (see chart for details).  Clinical Course    Patient presents with concern for wound infection and foul-smelling drainage from his decubitus wounds. These are very small on exam. He clinically is well-appearing and is afebrile. Basic labwork obtained. No leukocytosis. Lactate is 2.38. This is nonspecific. He is nonseptic appearing. He may have early cellulitis. No crepitus or concern for deep space infection at this time. Patient will be placed on doxycycline. I have encouraged him to follow-up very closely with his primary physician for recheck. If he develops fevers, worsening pain or any new or worsening symptoms he needs to be reevaluated immediately. Patient stated understanding.  After history, exam, and medical workup I feel the patient has been appropriately medically screened and is safe for discharge home. Pertinent diagnoses were discussed with the patient. Patient was given return precautions.   Final Clinical Impressions(s) / ED Diagnoses   Final diagnoses:  Decubitus ulcer of sacral region, stage 2   New Prescriptions New Prescriptions   DOXYCYCLINE (VIBRAMYCIN) 100 MG CAPSULE    Take 1 capsule (100 mg total) by mouth 2 (two) times daily.   I personally performed the services described in this documentation, which was scribed in my presence. The recorded information has been reviewed and is accurate.     Harold Hacker, MD 09/19/16 [id number]

## 2016-10-11 ENCOUNTER — Ambulatory Visit (INDEPENDENT_AMBULATORY_CARE_PROVIDER_SITE_OTHER): Payer: BLUE CROSS/BLUE SHIELD | Admitting: Orthopedic Surgery

## 2016-10-11 ENCOUNTER — Ambulatory Visit (INDEPENDENT_AMBULATORY_CARE_PROVIDER_SITE_OTHER): Payer: BLUE CROSS/BLUE SHIELD

## 2016-10-11 ENCOUNTER — Encounter (INDEPENDENT_AMBULATORY_CARE_PROVIDER_SITE_OTHER): Payer: Self-pay | Admitting: Orthopedic Surgery

## 2016-10-11 VITALS — Ht 74.0 in | Wt 250.0 lb

## 2016-10-11 DIAGNOSIS — M79672 Pain in left foot: Secondary | ICD-10-CM

## 2016-10-11 DIAGNOSIS — L97421 Non-pressure chronic ulcer of left heel and midfoot limited to breakdown of skin: Secondary | ICD-10-CM | POA: Diagnosis not present

## 2016-10-11 DIAGNOSIS — G8929 Other chronic pain: Secondary | ICD-10-CM

## 2016-10-11 DIAGNOSIS — M25562 Pain in left knee: Secondary | ICD-10-CM

## 2016-10-11 DIAGNOSIS — M1712 Unilateral primary osteoarthritis, left knee: Secondary | ICD-10-CM | POA: Insufficient documentation

## 2016-10-11 MED ORDER — METHYLPREDNISOLONE ACETATE 40 MG/ML IJ SUSP
40.0000 mg | INTRAMUSCULAR | Status: AC | PRN
  Administered 2016-10-11: 40 mg via INTRA_ARTICULAR

## 2016-10-11 MED ORDER — LIDOCAINE HCL 1 % IJ SOLN
5.0000 mL | INTRAMUSCULAR | Status: AC | PRN
  Administered 2016-10-11: 5 mL

## 2016-10-11 NOTE — Progress Notes (Signed)
Office Visit Note   Patient: [name]           Date of Birth: 21-Feb-1956           MRN: [id number] Visit Date: 10/11/2016              Requested by: Anda Kraft, MD [address] [place], [place] [address] PCP: [name], MD  Chief Complaint  Patient presents with  . Left Knee - Pain  . Left Foot - Pain  . Right Leg - New Evaluation    Right transtibial amputation poor fitting prosthetic.    HPI: Patient presents with multiple medical concerns. 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 He also complains of new acute left foot pain and skin discoloration, as well as chronic left knee pain.  He states he has multiple issues with prosthetic of right transtibial amputation. There is skin breakdown laterally and posterior residual limb. He has been working with [employer] prosthetic.   Patient presents with left acute foot skin breakdown. There is pitting edema left lower extremity. There is acute redness medial and lateral foot. He has been in new shoe wear recently.   He also complains of chronic left knee pain. He complains of buckling. History of arthritis. There is swelling and pain. He has been told in the past he may need total joint replacement.Maxcine Ham, RT  Patient is currently working with [facility] and just recently had his socket revised. Patient states he's been having difficulty getting the proper socket fit. Patient also complains of pain from a new blister over the entire lateral aspect the left foot and complaints from left knee arthritic symptoms as well he is status post a right total knee arthroplasty and a right transtibial amputation.  Assessment & Plan: Visit Diagnoses:  1. Pain in left foot   2. Chronic pain of left knee   3. Unilateral primary osteoarthritis, left knee   4. Midfoot ulcer, left, limited to breakdown of skin ([place])     Plan: After informed consent the left knee was injected from  the anterior medial portal with Depo-Medrol. Patient may benefit from a hyaluronic acid injection. Patient will follow up with [facility] for his prosthetic fitting on the right. Discussed the critical nature of the new blister over the entire lateral aspect of his left foot he needs to protect this continue with dressing changes with Silvadene continue with crutches nonweightbearing discussed the risk of amputation if this does not resolve.  Follow-Up Instructions: No Follow-up on file.   Ortho Exam On examination patient is alert oriented no adenopathy well-dressed normal affect normal S2 effort he ambulates with crutches. Examination the left foot he has a new massive blister of the entire lateral aspect left foot. The blister is the entire lateral aspect of his foot and about 3 cm wide this is clear blister with no cellulitis no purulence no signs of infection. I am uncertain etiology of this blister but it does not appear to be due to shoe wear but patient feels like it is due to his shoes being too narrow. Examination left knee is tender to palpation medial lateral joint line classic cruciate are stable he is crepitation with range of motion there is no effusion. Examination patient's right transtibial amputation he has a healing blisters on the residual limb.  Imaging: Xr Foot Complete Left  Result Date: 10/11/2016 Three-view radiographs of the left foot shows no complicating features no signs of  osteomyelitis no Charcot collapse.  Xr Knee 1-2 Views Left  Result Date: 10/11/2016 2 view radiographs left knee shows tricompartmental arthritis with joint space narrowing and periarticular bony spurs.   Orders:  Orders Placed This Encounter  Procedures  . XR Foot Complete Left  . XR Knee 1-2 Views Left   No orders of the defined types were placed in this encounter.    Procedures: Large Joint Inj Date/Time: 10/11/2016 2:24 PM Performed by: Mahli Glahn V Authorized by: Newt Minion    Consent Given by:  Patient Site marked: the procedure site was marked   Timeout: prior to procedure the correct patient, procedure, and site was verified   Indications:  Pain and diagnostic evaluation Location:  Knee Site:  L knee Needle Size:  22 G Needle Length:  1.5 inches Approach:  Anteromedial Ultrasound Guidance: No   Fluoroscopic Guidance: No   Arthrogram: No   Medications:  5 mL lidocaine 1 %; 40 mg methylPREDNISolone acetate 40 MG/ML Aspiration Attempted: No   Patient tolerance:  Patient tolerated the procedure well with no immediate complications    Clinical Data: No additional findings.  Subjective: Review of Systems  Objective: Vital Signs: Ht 6\' 2"  (1.88 m)   Wt 250 lb (113.4 kg)   BMI 32.10 kg/m   Specialty Comments:  No specialty comments available.  PMFS History: Patient Active Problem List   Diagnosis Date Noted  . Unilateral primary osteoarthritis, left knee 10/11/2016  . Midfoot ulcer, left, limited to breakdown of skin ([place]) 10/11/2016  . Hypertension 07/30/2016  . History of right below knee amputation ([place]) 07/12/2016  . Poorly controlled type 2 diabetes mellitus with peripheral neuropathy ([place]) 05/09/2016  . Acute osteomyelitis of left foot ([place])   . Pressure ulcer 05/08/2016  . Diabetic foot infection ([place]) 05/04/2016  . Diabetic foot ([place]) 05/04/2016  . Osteomyelitis of ankle or foot, acute ([place]) 04/25/2016  . Type 2 diabetes mellitus with insulin therapy ([place]) 12/16/2015  . Amputated toe ([place]) 10/21/2015  . Diabetic foot ulcer ([place]) 10/12/2015  . Cellulitis of left foot 09/03/2015  . Leg edema, left 09/03/2015  . Acute renal failure ([place]) 09/03/2015  . CAD (dz of distal, mid and proximal RCA with implantation of 3 overlapping drug-eluting stent,) 09/03/2015  . Chronic diastolic heart failure, NYHA class 2 ([place]) 09/03/2015  . Opacity of lung on imaging study 01/14/2015  . Angina pectoris associated with type 2 diabetes mellitus  ([place]) 10/28/2014  . Hyperlipidemia 08/25/2014  . Chronic total occlusion of artery of the extremities ([place]) 04/08/2012  . Onychomycosis 02/01/2012  . Diabetes mellitus, insulin dependent (IDDM), controlled ([place]) 11/01/2011  . Occlusion and stenosis of carotid artery without mention of cerebral infarction 06/12/2011  . Aftercare following surgery of the circulatory system, [place] 06/12/2011  . GERD (gastroesophageal reflux disease) 05/08/2011  . Barrett's esophagus without dysplasia 05/08/2011  . PVD (peripheral vascular disease) ([place]) 02/01/2011  . HTN (hypertension) 02/01/2011  . Former tobacco use 02/01/2011   Past Medical History:  Diagnosis Date  . Barrett's esophagus   . Carotid artery occlusion 11/10/10   LEFT CAROTID ENDARTERECTOMY  . CHF (congestive heart failure) ([place])   . Complication of anesthesia    BP WENT UP AT [facility] "  . COPD (chronic obstructive pulmonary disease) ([place])   . Coronary artery disease   . Diabetes mellitus   . Diverticulosis of colon (without mention of hemorrhage)   . DJD (degenerative joint disease)   . Emphysema of lung ([place])   .  Fatty liver   . Full dentures   . GERD (gastroesophageal reflux disease)   . H/O Doppler ultrasound 06/28/2012   Lower venous duplex left-- findings consistent with acute deep vein thrombosis involving the personal vein of the left lower extremity. No evidence of Baker's cyst on the left. Duplex scan of the left lower extremity arteries reveal adequate blood flow.   . H/O hiatal hernia   . History of cardiovascular stress test 03/23/2011   ejection fraction measured 51%, with an end-diastolic volume of 601 ml and an end-systolic volume of 81 ml. no evidence of myocardial ischemia.  Marland Kitchen History of echocardiogram 08/22/2011   Mildly hypertrophic left ventricle with normal systolic function and mild diastolic dysfunction. mildly dilated left atrium. no significant valvular abnormalities. no pericardial effusion no change from previous  study 10/2010. the ejection fraction =>55%  . History of Holter monitoring 02/14/2012   Scan quality is fair due to baseline artifact. Predominant rhythm is Sinus. Veentricular Ectopy was noted in single beat form. The total recording time was 24:00 hours. total beats =104061. the total time analyzed was 23:58 hours. Heart  Max = 102 at 1:18:22, Min= 52 at 9:56:19 am., Avg= 70. There were 0 pause(s) > 2.5 seconds. the longest pause = 0.00 sec. at 1:51 pm(1)  . Hyperlipidemia   . Hypertension   . Neuromuscular disorder ([place])    peripheral neuropathy  . OSA (obstructive sleep apnea) 12/26/08   AHI of 6.3/hr overall, 32.7/hr during REM sleep and an RDI of 24.8/hr and 50.9/hr.  . Osteomyelitis ([place])   . Osteomyelitis ([place])    left 5th metatarsal  . PAD (peripheral artery disease) ([place])    Distal aortogram June 2012. Atherectomy left popliteal artery July 2012.   . Sleep apnea    has not used CPAP in e years - took self off  . Slurred speech    AS PER WIFE IN D/C NOTE 11/10/10  . Substernal chest pain   . Visual color changes    LEFT EYE; BUT NOT TOTAL BLINDNESS  . Wears glasses     Family History  Problem Relation Age of Onset  . Heart disease Father     Before age 53-  CAD, BPG  . Diabetes Father     Amputation  . Cancer Father     PROSTATE  . Hyperlipidemia Father   . Hypertension Father   . Heart attack Father     Triple BPG  . Varicose Veins Father   . Colon cancer Brother   . Cancer Brother     Colon  . Diabetes Brother   . Heart disease Brother 73    A-Fib. Before age 31  . Hyperlipidemia Brother   . Hypertension Brother   . Cancer Sister     Breast  . Hyperlipidemia Sister   . Hypertension Sister   . Hypertension Son   . Arthritis      GRANDMOTHER  . Hypertension      OTHER FAMILY MEMBERS    Past Surgical History:  Procedure Laterality Date  . AMPUTATION  11/05/2011   Procedure: AMPUTATION RAY;  Surgeon: Wylene Simmer, MD;  Location: [facility];  Service: Orthopedics;   Laterality: Right;  Amputation of Right 4&5th Toes  . AMPUTATION Left 11/26/2012   Procedure: AMPUTATION RAY;  Surgeon: Wylene Simmer, MD;  Location: [place] [facility];  Service: Orthopedics;  Laterality: Left;  fourth ray amputation  . AMPUTATION Right 08/27/2014   Procedure: Transmetatarsal Amputation;  Surgeon: Newt Minion,  MD;  Location: [place];  Service: Orthopedics;  Laterality: Right;  . AMPUTATION Right 01/14/2015   Procedure: AMPUTATION BELOW KNEE;  Surgeon: Newt Minion, MD;  Location: [facility];  Service: Orthopedics;  Laterality: Right;  . AMPUTATION Left 10/21/2015   Procedure: Left Foot 5th Ray Amputation;  Surgeon: Newt Minion, MD;  Location: [facility];  Service: Orthopedics;  Laterality: Left;  . ANTERIOR FUSION CERVICAL SPINE  02/06/06   C4-5, C5-6, C6-7; SURGEON DR. MAX COHEN  . CARDIAC CATHETERIZATION  10/31/04   2009  . CAROTID ENDARTERECTOMY  11/10/10  . CAROTID ENDARTERECTOMY Left 11/10/2010   Subtotal occlusion of left internal carotid artery with left hemispheric transient ischemic attacks.  . CARPAL TUNNEL RELEASE Right 10/21/2013   Procedure: RIGHT CARPAL TUNNEL RELEASE;  Surgeon: Wynonia Sours, MD;  Location: [place] [facility];  Service: Orthopedics;  Laterality: Right;  . CHOLECYSTECTOMY    . COLONOSCOPY    . ESOPHAGEAL MANOMETRY Bilateral 07/19/2014   Procedure: ESOPHAGEAL MANOMETRY (EM);  Surgeon: Jerene Bears, MD;  Location: [facility];  Service: Gastroenterology;  Laterality: Bilateral;  . FEMORAL ARTERY STENT     x6  . FOOT SURGERY  04/25/2016    EXCISION BASE 5TH METATARSAL AND PARTIAL CUBOID LEFT FOOT  . HERNIA REPAIR     LEFT INGUINAL AND UMBILICAL REPAIRS  . I&D EXTREMITY Left 04/25/2016   Procedure: EXCISION BASE 5TH METATARSAL AND PARTIAL CUBOID LEFT FOOT;  Surgeon: Newt Minion, MD;  Location: [facility];  Service: Orthopedics;  Laterality: Left;  . JOINT REPLACEMENT Right 2001   Total  . LAMINECTOMY     X 3 LUMBAR AND X 2 CERVICAL SPINE OPERATIONS  . LAPAROSCOPIC  CHOLECYSTECTOMY W/ CHOLANGIOGRAPHY  11/09/04   SURGEON DR. Luella Cook  . LEFT HEART CATHETERIZATION WITH CORONARY ANGIOGRAM N/A 10/29/2014   Procedure: LEFT HEART CATHETERIZATION WITH CORONARY ANGIOGRAM;  Surgeon: Laverda Page, MD;  Location: [facility] CATH LAB;  Service: Cardiovascular;  Laterality: N/A;  . LOWER EXTREMITY ANGIOGRAM N/A 03/19/2012   Procedure: LOWER EXTREMITY ANGIOGRAM;  Surgeon: Burnell Blanks, MD;  Location: [facility] CATH LAB;  Service: Cardiovascular;  Laterality: N/A;  . NECK SURGERY    . PENILE PROSTHESIS IMPLANT  08/14/05   INFRAPUBIC INSERTION OF INFLATABLE PENILE PROSTHESIS; SURGEON DR. Amalia Hailey  . PERCUTANEOUS CORONARY STENT INTERVENTION (PCI-S) Right 10/29/2014   Procedure: PERCUTANEOUS CORONARY STENT INTERVENTION (PCI-S);  Surgeon: Laverda Page, MD;  Location: [facility] CATH LAB;  Service: Cardiovascular;  Laterality: Right;  . SHOULDER ARTHROSCOPY    . SPINE SURGERY    . TONSILLECTOMY    . TOTAL KNEE ARTHROPLASTY  07/2002   RIGHT KNEE ; SURGEON  DR. [name] ALSO HAD ARTHROSCOPIC RIGHT KNEE IN  10/2001  . ULNAR NERVE TRANSPOSITION Right 10/21/2013   Procedure: RIGHT ELBOW  ULNAR NERVE DECOMPRESSION;  Surgeon: Wynonia Sours, MD;  Location: [facility];  Service: Orthopedics;  Laterality: Right;   Social History   Occupational History  . TRUCK DRIVER Unemployed   Social History Main Topics  . Smoking status: Current Every Day Smoker    Packs/day: 0.50    Years: 35.00    Types: E-cigarettes    Last attempt to quit: 10/28/2011  . Smokeless tobacco: Never Used     Comment: VAPOR CIGARETTES  . Alcohol use No     Comment: "not in a long time"  . Drug use: No  . Sexual activity: Yes    Birth control/ protection: Implant

## 2016-11-01 ENCOUNTER — Ambulatory Visit (INDEPENDENT_AMBULATORY_CARE_PROVIDER_SITE_OTHER): Payer: Medicare Other | Admitting: Orthopedic Surgery

## 2016-11-05 ENCOUNTER — Encounter (INDEPENDENT_AMBULATORY_CARE_PROVIDER_SITE_OTHER): Payer: Self-pay | Admitting: Orthopedic Surgery

## 2016-11-05 ENCOUNTER — Ambulatory Visit (INDEPENDENT_AMBULATORY_CARE_PROVIDER_SITE_OTHER): Payer: BLUE CROSS/BLUE SHIELD | Admitting: Orthopedic Surgery

## 2016-11-05 VITALS — Ht 74.0 in | Wt 250.0 lb

## 2016-11-05 DIAGNOSIS — I87332 Chronic venous hypertension (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: Secondary | ICD-10-CM

## 2016-11-05 DIAGNOSIS — L97929 Non-pressure chronic ulcer of unspecified part of left lower leg with unspecified severity: Secondary | ICD-10-CM

## 2016-11-05 NOTE — Progress Notes (Signed)
Office Visit Note   Patient: [name]           Date of Birth: [birth year]           MRN: [id number] Visit Date: 11/05/2016              Requested by: Anda Kraft, MD [address] [place],  [address] PCP: [name], MD  Chief Complaint  Patient presents with  . Left Foot - Wound Check    HPI: Patient has a very swollen left leg and states it is because he cannot wear his compression sock while treating his ulcer. Lateral side foot healing spot amount of pink drain. He is weight bearing in a post op shoe and one crutch.     Assessment & Plan: Visit Diagnoses:  1. Chronic venous hypertension (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 (HCC)     Plan: We'll have patient resume wearing his medical compression socks. Discussed the importance of compression he will wear this around the clock if he can tolerate it. Plan to follow-up in 4 weeks' reevaluate his leg.  Follow-Up Instructions: Return in about 4 weeks (around 12/03/2016).   Ortho Exam On examination patient is alert oriented no adenopathy well-dressed normal affect normal respiratory effort is an antalgic gait uses a crutch. Examination he still has persistent ulceration laterally there is significant venous stasis swelling there is no cellulitis no odor no signs of infection.  Imaging: No results found.  Orders:  No orders of the defined types were placed in this encounter.  No orders of the defined types were placed in this encounter.    Procedures: No procedures performed  Clinical Data: No additional findings.  Subjective: Review of Systems  Objective: Vital Signs: Ht 6\' 2"  (1.88 m)   Wt 250 lb (113.4 kg)   BMI 32.10 kg/m   Specialty Comments:  No specialty comments available.  PMFS History: Patient Active Problem List   Diagnosis Date Noted  . Unilateral primary osteoarthritis, left knee 10/11/2016  . Midfoot ulcer, left, limited to  breakdown of skin ([place]) 10/11/2016  . Hypertension 07/30/2016  . History of right below knee amputation ([place]) 07/12/2016  . Poorly controlled type 2 diabetes mellitus with peripheral neuropathy ([place]) 05/09/2016  . Acute osteomyelitis of left foot ([place])   . Pressure ulcer 05/08/2016  . Diabetic foot infection ([place]) 05/04/2016  . Diabetic foot ([place]) 05/04/2016  . Osteomyelitis of ankle or foot, acute ([place]) 04/25/2016  . Type 2 diabetes mellitus with insulin therapy ([place]) 12/16/2015  . Amputated toe ([place]) 10/21/2015  . Diabetic foot ulcer ([place]) 10/12/2015  . Cellulitis of left foot 09/03/2015  . Leg edema, left 09/03/2015  . Acute renal failure ([place]) 09/03/2015  . CAD (dz of distal, mid and proximal RCA with implantation of 3 overlapping drug-eluting stent,) 09/03/2015  . Chronic diastolic heart failure, NYHA class 2 ([place]) 09/03/2015  . Opacity of lung on imaging study 01/14/2015  . Angina pectoris associated with type 2 diabetes mellitus ([place]) 10/28/2014  . Hyperlipidemia 08/25/2014  . Chronic total occlusion of artery of the extremities ([place]) 04/08/2012  . Onychomycosis 02/01/2012  . Diabetes mellitus, insulin dependent (IDDM), controlled ([place]) 11/01/2011  . Occlusion and stenosis of carotid artery without mention of cerebral infarction 06/12/2011  . Aftercare following surgery of the circulatory system, [place] 06/12/2011  . GERD (gastroesophageal reflux disease) 05/08/2011  . Barrett's esophagus without dysplasia 05/08/2011  . PVD (peripheral vascular disease) ([place]) 02/01/2011  .  HTN (hypertension) 02/01/2011  . Former tobacco use 02/01/2011   Past Medical History:  Diagnosis Date  . Barrett's esophagus   . Carotid artery occlusion 11/10/10   LEFT CAROTID ENDARTERECTOMY  . CHF (congestive heart failure) ([place])   . Complication of anesthesia    BP WENT UP AT [facility] "  . COPD (chronic obstructive pulmonary disease) ([place])   . Coronary artery disease   . Diabetes mellitus   .  Diverticulosis of colon (without mention of hemorrhage)   . DJD (degenerative joint disease)   . Emphysema of lung ([place])   . Fatty liver   . Full dentures   . GERD (gastroesophageal reflux disease)   . H/O Doppler ultrasound 06/28/2012   Lower venous duplex left-- findings consistent with acute deep vein thrombosis involving the personal vein of the left lower extremity. No evidence of Baker's cyst on the left. Duplex scan of the left lower extremity arteries reveal adequate blood flow.   . H/O hiatal hernia   . History of cardiovascular stress test 03/23/2011   ejection fraction measured 51%, with an end-diastolic volume of 824 ml and an end-systolic volume of 81 ml. no evidence of myocardial ischemia.  Marland Kitchen History of echocardiogram 08/22/2011   Mildly hypertrophic left ventricle with normal systolic function and mild diastolic dysfunction. mildly dilated left atrium. no significant valvular abnormalities. no pericardial effusion no change from previous study 10/2010. the ejection fraction =>55%  . History of Holter monitoring 02/14/2012   Scan quality is fair due to baseline artifact. Predominant rhythm is Sinus. Veentricular Ectopy was noted in single beat form. The total recording time was 24:00 hours. total beats =104061. the total time analyzed was 23:58 hours. Heart  Max = 102 at 1:18:22, Min= 52 at 9:56:19 am., Avg= 70. There were 0 pause(s) > 2.5 seconds. the longest pause = 0.00 sec. at 1:51 pm(1)  . Hyperlipidemia   . Hypertension   . Neuromuscular disorder ([place])    peripheral neuropathy  . OSA (obstructive sleep apnea) 12/26/08   AHI of 6.3/hr overall, 32.7/hr during REM sleep and an RDI of 24.8/hr and 50.9/hr.  . Osteomyelitis ([place])   . Osteomyelitis ([place])    left 5th metatarsal  . PAD (peripheral artery disease) ([place])    Distal aortogram June 2012. Atherectomy left popliteal artery July 2012.   . Sleep apnea    has not used CPAP in e years - took self off  . Slurred speech    AS  PER WIFE IN D/C NOTE 11/10/10  . Substernal chest pain   . Visual color changes    LEFT EYE; BUT NOT TOTAL BLINDNESS  . Wears glasses     Family History  Problem Relation Age of Onset  . Heart disease Father     Before age 53-  CAD, BPG  . Diabetes Father     Amputation  . Cancer Father     PROSTATE  . Hyperlipidemia Father   . Hypertension Father   . Heart attack Father     Triple BPG  . Varicose Veins Father   . Colon cancer Brother   . Cancer Brother     Colon  . Diabetes Brother   . Heart disease Brother 61    A-Fib. Before age 1  . Hyperlipidemia Brother   . Hypertension Brother   . Cancer Sister     Breast  . Hyperlipidemia Sister   . Hypertension Sister   . Hypertension Son   . Arthritis  GRANDMOTHER  . Hypertension      OTHER FAMILY MEMBERS    Past Surgical History:  Procedure Laterality Date  . AMPUTATION  11/05/2011   Procedure: AMPUTATION RAY;  Surgeon: Wylene Simmer, MD;  Location: [place];  Service: Orthopedics;  Laterality: Right;  Amputation of Right 4&5th Toes  . AMPUTATION Left 11/26/2012   Procedure: AMPUTATION RAY;  Surgeon: Wylene Simmer, MD;  Location: [place];  Service: Orthopedics;  Laterality: Left;  fourth ray amputation  . AMPUTATION Right 08/27/2014   Procedure: Transmetatarsal Amputation;  Surgeon: Newt Minion, MD;  Location: [facility];  Service: Orthopedics;  Laterality: Right;  . AMPUTATION Right 01/14/2015   Procedure: AMPUTATION BELOW KNEE;  Surgeon: Newt Minion, MD;  Location: [place];  Service: Orthopedics;  Laterality: Right;  . AMPUTATION Left 10/21/2015   Procedure: Left Foot 5th Ray Amputation;  Surgeon: Newt Minion, MD;  Location: [facility];  Service: Orthopedics;  Laterality: Left;  . ANTERIOR FUSION CERVICAL SPINE  02/06/06   C4-5, C5-6, C6-7; SURGEON DR. MAX COHEN  . CARDIAC CATHETERIZATION  10/31/04   2009  . CAROTID ENDARTERECTOMY  11/10/10  . CAROTID ENDARTERECTOMY Left 11/10/2010   Subtotal occlusion of left internal carotid artery with  left hemispheric transient ischemic attacks.  . CARPAL TUNNEL RELEASE Right 10/21/2013   Procedure: RIGHT CARPAL TUNNEL RELEASE;  Surgeon: Wynonia Sours, MD;  Location: [facility];  Service: Orthopedics;  Laterality: Right;  . CHOLECYSTECTOMY    . COLONOSCOPY    . ESOPHAGEAL MANOMETRY Bilateral 07/19/2014   Procedure: ESOPHAGEAL MANOMETRY (EM);  Surgeon: Jerene Bears, MD;  Location: [facility];  Service: Gastroenterology;  Laterality: Bilateral;  . FEMORAL ARTERY STENT     x6  . FOOT SURGERY  04/25/2016    EXCISION BASE 5TH METATARSAL AND PARTIAL CUBOID LEFT FOOT  . HERNIA REPAIR     LEFT INGUINAL AND UMBILICAL REPAIRS  . I&D EXTREMITY Left 04/25/2016   Procedure: EXCISION BASE 5TH METATARSAL AND PARTIAL CUBOID LEFT FOOT;  Surgeon: Newt Minion, MD;  Location: [facility];  Service: Orthopedics;  Laterality: Left;  . JOINT REPLACEMENT Right 2001   Total  . LAMINECTOMY     X 3 LUMBAR AND X 2 CERVICAL SPINE OPERATIONS  . LAPAROSCOPIC CHOLECYSTECTOMY W/ CHOLANGIOGRAPHY  11/09/04   SURGEON DR. Luella Cook  . LEFT HEART CATHETERIZATION WITH CORONARY ANGIOGRAM N/A 10/29/2014   Procedure: LEFT HEART CATHETERIZATION WITH CORONARY ANGIOGRAM;  Surgeon: Laverda Page, MD;  Location: [facility] CATH LAB;  Service: Cardiovascular;  Laterality: N/A;  . LOWER EXTREMITY ANGIOGRAM N/A 03/19/2012   Procedure: LOWER EXTREMITY ANGIOGRAM;  Surgeon: Burnell Blanks, MD;  Location: [facility] CATH LAB;  Service: Cardiovascular;  Laterality: N/A;  . NECK SURGERY    . PENILE PROSTHESIS IMPLANT  08/14/05   INFRAPUBIC INSERTION OF INFLATABLE PENILE PROSTHESIS; SURGEON DR. Amalia Hailey  . PERCUTANEOUS CORONARY STENT INTERVENTION (PCI-S) Right 10/29/2014   Procedure: PERCUTANEOUS CORONARY STENT INTERVENTION (PCI-S);  Surgeon: Laverda Page, MD;  Location: [facility] CATH LAB;  Service: Cardiovascular;  Laterality: Right;  . SHOULDER ARTHROSCOPY    . SPINE SURGERY    . TONSILLECTOMY    . TOTAL KNEE ARTHROPLASTY  07/2002    RIGHT KNEE ; SURGEON  DR. [name] ALSO HAD ARTHROSCOPIC RIGHT KNEE IN  10/2001  . ULNAR NERVE TRANSPOSITION Right 10/21/2013   Procedure: RIGHT ELBOW  ULNAR NERVE DECOMPRESSION;  Surgeon: Wynonia Sours, MD;  Location: [place];  Service: Orthopedics;  Laterality: Right;   Social History   Occupational History  . TRUCK DRIVER Unemployed   Social History Main Topics  . Smoking status: Current Every Day Smoker    Packs/day: 0.50    Years: 35.00    Types: E-cigarettes    Last attempt to quit: 10/28/2011  . Smokeless tobacco: Never Used     Comment: VAPOR CIGARETTES  . Alcohol use No     Comment: "not in a long time"  . Drug use: No  . Sexual activity: Yes    Birth control/ protection: Implant

## 2016-11-12 ENCOUNTER — Ambulatory Visit (INDEPENDENT_AMBULATORY_CARE_PROVIDER_SITE_OTHER): Payer: BLUE CROSS/BLUE SHIELD | Admitting: Orthopedic Surgery

## 2016-11-12 ENCOUNTER — Encounter (INDEPENDENT_AMBULATORY_CARE_PROVIDER_SITE_OTHER): Payer: Self-pay | Admitting: Orthopedic Surgery

## 2016-11-12 VITALS — Ht 74.0 in | Wt 250.0 lb

## 2016-11-12 DIAGNOSIS — L97521 Non-pressure chronic ulcer of other part of left foot limited to breakdown of skin: Secondary | ICD-10-CM | POA: Diagnosis not present

## 2016-11-12 DIAGNOSIS — I739 Peripheral vascular disease, unspecified: Secondary | ICD-10-CM | POA: Diagnosis not present

## 2016-11-12 HISTORY — DX: Non-pressure chronic ulcer of other part of left foot limited to breakdown of skin: L97.521

## 2016-11-12 NOTE — Progress Notes (Signed)
Office Visit Note   Patient: [name]           Date of Birth: 11/04/1955           MRN: [id number] Visit Date: 11/12/2016              Requested by: Anda Kraft, MD [address] [place], [place] [address] PCP: [name], MD  Chief Complaint  Patient presents with  . Left Foot - Follow-up    HPI: Pt is follow up for a left foot ulcer. He is wearing a dynaflex compression wrap and mepilex adhesive dressing to the lateral foot and medial side ulcer. The swelling has come down greatly he is ambulating with 1 crutch and his prosthetic on the other side. He did bring his Vive sock in anticipation of using this today. Pamella Pert, RMA    Assessment & Plan: Visit Diagnoses:  1. PVD (peripheral vascular disease) ([place])   2. Non-pressure chronic ulcer of other part of left foot limited to breakdown of skin ([place])     Plan: The ulcers are healing quite nicely. He has new shoes with double upright brace is in custom orthotics. The medical compression sock was applied he will wear the 15-20 mm compression around-the-clock and change this daily with a new sock.  Follow-Up Instructions: Return in about 4 weeks (around 12/10/2016).   Ortho Exam Examination patient is alert oriented no adenopathy well-dressed normal affect normal respiratory effort he does have an antalgic gait. Semination is a stable right transtibial amputation left foot shows a well healing ulcer over the base of the fifth metatarsal as well as the medial aspect of the MTP joint. These have superficial epithelialization he has no redness no cellulitis no odor no drainage. He still has persistent venous stasis swelling which has improved with the compression wrap. ROS: Complete review of systems is negative except as noted in the history of present illness Imaging: No results found.  Labs: Lab Results  Component Value Date   HGBA1C 8.6 (H) 05/04/2016   HGBA1C 9.2 (H) 04/25/2016   HGBA1C  6.8 (H) 12/17/2015   ESRSEDRATE 35 (H) 09/03/2016   ESRSEDRATE 111 (H) 05/11/2016   ESRSEDRATE 98 (H) 05/04/2016   CRP 1.2 (H) 09/03/2016   CRP 4.7 (H) 05/11/2016   CRP 8.0 (H) 05/04/2016   REPTSTATUS 05/09/2016 FINAL 05/04/2016   GRAMSTAIN  09/26/2014    NO WBC SEEN NO SQUAMOUS EPITHELIAL CELLS SEEN NO ORGANISMS SEEN Performed at [facility]    CULT NO GROWTH 5 DAYS 05/04/2016   LABORGA STAPHYLOCOCCUS AUREUS 09/26/2014    Orders:  No orders of the defined types were placed in this encounter.  No orders of the defined types were placed in this encounter.    Procedures: No procedures performed  Clinical Data: No additional findings.  Subjective: Review of Systems  Objective: Vital Signs: Ht 6\' 2"  (1.88 m)   Wt 250 lb (113.4 kg)   BMI 32.10 kg/m   Specialty Comments:  No specialty comments available.  PMFS History: Patient Active Problem List   Diagnosis Date Noted  . Non-pressure chronic ulcer of other part of left foot limited to breakdown of skin ([place]) 11/12/2016  . Unilateral primary osteoarthritis, left knee 10/11/2016  . Midfoot ulcer, left, limited to breakdown of skin ([place]) 10/11/2016  . Hypertension 07/30/2016  . History of right below knee amputation ([place]) 07/12/2016  . Poorly controlled type 2 diabetes mellitus with peripheral neuropathy ([place]) 05/09/2016  .  Acute osteomyelitis of left foot ([place])   . Pressure ulcer 05/08/2016  . Diabetic foot infection ([place]) 05/04/2016  . Diabetic foot ([place]) 05/04/2016  . Osteomyelitis of ankle or foot, acute ([place]) 04/25/2016  . Type 2 diabetes mellitus with insulin therapy ([place]) 12/16/2015  . Amputated toe ([place]) 10/21/2015  . Diabetic foot ulcer ([place]) 10/12/2015  . Cellulitis of left foot 09/03/2015  . Leg edema, left 09/03/2015  . Acute renal failure ([place]) 09/03/2015  . CAD (dz of distal, mid and proximal RCA with implantation of 3 overlapping drug-eluting stent,) 09/03/2015  . Chronic diastolic heart  failure, NYHA class 2 ([place]) 09/03/2015  . Opacity of lung on imaging study 01/14/2015  . Angina pectoris associated with type 2 diabetes mellitus ([place]) 10/28/2014  . Hyperlipidemia 08/25/2014  . Chronic total occlusion of artery of the extremities ([place]) 04/08/2012  . Onychomycosis 02/01/2012  . Diabetes mellitus, insulin dependent (IDDM), controlled ([place]) 11/01/2011  . Occlusion and stenosis of carotid artery without mention of cerebral infarction 06/12/2011  . Aftercare following surgery of the circulatory system, [place] 06/12/2011  . GERD (gastroesophageal reflux disease) 05/08/2011  . Barrett's esophagus without dysplasia 05/08/2011  . PVD (peripheral vascular disease) ([place]) 02/01/2011  . HTN (hypertension) 02/01/2011  . Former tobacco use 02/01/2011   Past Medical History:  Diagnosis Date  . Barrett's esophagus   . Carotid artery occlusion 11/10/10   LEFT CAROTID ENDARTERECTOMY  . CHF (congestive heart failure) ([place])   . Complication of anesthesia    BP WENT UP AT [facility] "  . COPD (chronic obstructive pulmonary disease) ([place])   . Coronary artery disease   . Diabetes mellitus   . Diverticulosis of colon (without mention of hemorrhage)   . DJD (degenerative joint disease)   . Emphysema of lung ([place])   . Fatty liver   . Full dentures   . GERD (gastroesophageal reflux disease)   . H/O Doppler ultrasound 06/28/2012   Lower venous duplex left-- findings consistent with acute deep vein thrombosis involving the personal vein of the left lower extremity. No evidence of Baker's cyst on the left. Duplex scan of the left lower extremity arteries reveal adequate blood flow.   . H/O hiatal hernia   . History of cardiovascular stress test 03/23/2011   ejection fraction measured 51%, with an end-diastolic volume of 130 ml and an end-systolic volume of 81 ml. no evidence of myocardial ischemia.  Marland Kitchen History of echocardiogram 08/22/2011   Mildly hypertrophic left ventricle with normal systolic function  and mild diastolic dysfunction. mildly dilated left atrium. no significant valvular abnormalities. no pericardial effusion no change from previous study 10/2010. the ejection fraction =>55%  . History of Holter monitoring 02/14/2012   Scan quality is fair due to baseline artifact. Predominant rhythm is Sinus. Veentricular Ectopy was noted in single beat form. The total recording time was 24:00 hours. total beats =104061. the total time analyzed was 23:58 hours. Heart  Max = 102 at 1:18:22, Min= 52 at 9:56:19 am., Avg= 70. There were 0 pause(s) > 2.5 seconds. the longest pause = 0.00 sec. at 1:51 pm(1)  . Hyperlipidemia   . Hypertension   . Neuromuscular disorder ([place])    peripheral neuropathy  . OSA (obstructive sleep apnea) 12/26/08   AHI of 6.3/hr overall, 32.7/hr during REM sleep and an RDI of 24.8/hr and 50.9/hr.  . Osteomyelitis ([place])   . Osteomyelitis ([place])    left 5th metatarsal  . PAD (peripheral artery disease) (HCC)    Distal aortogram  June 2012. Atherectomy left popliteal artery July 2012.   . Sleep apnea    has not used CPAP in e years - took self off  . Slurred speech    AS PER WIFE IN D/C NOTE 11/10/10  . Substernal chest pain   . Visual color changes    LEFT EYE; BUT NOT TOTAL BLINDNESS  . Wears glasses     Family History  Problem Relation Age of Onset  . Heart disease Father     Before age 31-  CAD, BPG  . Diabetes Father     Amputation  . Cancer Father     PROSTATE  . Hyperlipidemia Father   . Hypertension Father   . Heart attack Father     Triple BPG  . Varicose Veins Father   . Colon cancer Brother   . Cancer Brother     Colon  . Diabetes Brother   . Heart disease Brother 33    A-Fib. Before age 48  . Hyperlipidemia Brother   . Hypertension Brother   . Cancer Sister     Breast  . Hyperlipidemia Sister   . Hypertension Sister   . Hypertension Son   . Arthritis      GRANDMOTHER  . Hypertension      OTHER FAMILY MEMBERS    Past Surgical History:    Procedure Laterality Date  . AMPUTATION  11/05/2011   Procedure: AMPUTATION RAY;  Surgeon: Wylene Simmer, MD;  Location: [place];  Service: Orthopedics;  Laterality: Right;  Amputation of Right 4&5th Toes  . AMPUTATION Left 11/26/2012   Procedure: AMPUTATION RAY;  Surgeon: Wylene Simmer, MD;  Location: [place];  Service: Orthopedics;  Laterality: Left;  fourth ray amputation  . AMPUTATION Right 08/27/2014   Procedure: Transmetatarsal Amputation;  Surgeon: Newt Minion, MD;  Location: [place];  Service: Orthopedics;  Laterality: Right;  . AMPUTATION Right 01/14/2015   Procedure: AMPUTATION BELOW KNEE;  Surgeon: Newt Minion, MD;  Location: [place];  Service: Orthopedics;  Laterality: Right;  . AMPUTATION Left 10/21/2015   Procedure: Left Foot 5th Ray Amputation;  Surgeon: Newt Minion, MD;  Location: [facility] [place];  Service: Orthopedics;  Laterality: Left;  . ANTERIOR FUSION CERVICAL SPINE  02/06/06   C4-5, C5-6, C6-7; SURGEON DR. MAX COHEN  . CARDIAC CATHETERIZATION  10/31/04   2009  . CAROTID ENDARTERECTOMY  11/10/10  . CAROTID ENDARTERECTOMY Left 11/10/2010   Subtotal occlusion of left internal carotid artery with left hemispheric transient ischemic attacks.  . CARPAL TUNNEL RELEASE Right 10/21/2013   Procedure: RIGHT CARPAL TUNNEL RELEASE;  Surgeon: Wynonia Sours, MD;  Location: [place];  Service: Orthopedics;  Laterality: Right;  . CHOLECYSTECTOMY    . COLONOSCOPY    . ESOPHAGEAL MANOMETRY Bilateral 07/19/2014   Procedure: ESOPHAGEAL MANOMETRY (EM);  Surgeon: Jerene Bears, MD;  Location: [facility];  Service: Gastroenterology;  Laterality: Bilateral;  . FEMORAL ARTERY STENT     x6  . FOOT SURGERY  04/25/2016    EXCISION BASE 5TH METATARSAL AND PARTIAL CUBOID LEFT FOOT  . HERNIA REPAIR     LEFT INGUINAL AND UMBILICAL REPAIRS  . I&D EXTREMITY Left 04/25/2016   Procedure: EXCISION BASE 5TH METATARSAL AND PARTIAL CUBOID LEFT FOOT;  Surgeon: Newt Minion, MD;  Location: [place];  Service:  Orthopedics;  Laterality: Left;  . JOINT REPLACEMENT Right 2001   Total  . LAMINECTOMY     X 3 LUMBAR AND X 2 CERVICAL  SPINE OPERATIONS  . LAPAROSCOPIC CHOLECYSTECTOMY W/ CHOLANGIOGRAPHY  11/09/04   SURGEON DR. Luella Cook  . LEFT HEART CATHETERIZATION WITH CORONARY ANGIOGRAM N/A 10/29/2014   Procedure: LEFT HEART CATHETERIZATION WITH CORONARY ANGIOGRAM;  Surgeon: Laverda Page, MD;  Location[facility] C[facility] LAB;  Service: Cardiovascular;  Laterality: N/A;  . LOWER EXTREMITY ANGIOGRAM N/A 03/19/2012   Procedure: LOWER EXTREMITY ANGIOGRAM;  Surgeon: Burnell Blanks, MD;  Location[facility] C[facility] LAB;  Service: Cardiovascular;  Laterality: N/A;  . NECK SURGERY    . PENILE PROSTHESIS IMPLANT  08/14/05   INFRAPUBIC INSERTION OF INFLATABLE PENILE PROSTHESIS; SURGEON DR. Amalia Hailey  . PERCUTANEOUS CORONARY STENT INTERVENTION (PCI-S) Right 10/29/2014   Procedure: PERCUTANEOUS CORONARY STENT INTERVENTION (PCI-S);  Surgeon: Laverda Page, MD;  Location: [facility] CATH LAB;  Service: Cardiovascular;  Laterality: Right;  . SHOULDER ARTHROSCOPY    . SPINE SURGERY    . TONSILLECTOMY    . TOTAL KNEE ARTHROPLASTY  07/2002   RIGHT KNEE ; SURGEON  DR. [name] ALSO HAD ARTHROSCOPIC RIGHT KNEE IN  10/2001  . ULNAR NERVE TRANSPOSITION Right 10/21/2013   Procedure: RIGHT ELBOW  ULNAR NERVE DECOMPRESSION;  Surgeon: Wynonia Sours, MD;  Location: [place];  Service: Orthopedics;  Laterality: Right;   Social History   Occupational History  . TRUCK DRIVER Unemployed   Social History Main Topics  . Smoking status: Current Every Day Smoker    Packs/day: 0.50    Years: 35.00    Types: E-cigarettes    Last attempt to quit: 10/28/2011  . Smokeless tobacco: Never Used     Comment: VAPOR CIGARETTES  . Alcohol use No     Comment: "not in a long time"  . Drug use: No  . Sexual activity: Yes    Birth control/ protection: Implant

## 2016-12-11 ENCOUNTER — Encounter (INDEPENDENT_AMBULATORY_CARE_PROVIDER_SITE_OTHER): Payer: Self-pay | Admitting: Orthopedic Surgery

## 2016-12-11 ENCOUNTER — Ambulatory Visit (INDEPENDENT_AMBULATORY_CARE_PROVIDER_SITE_OTHER): Payer: BLUE CROSS/BLUE SHIELD | Admitting: Orthopedic Surgery

## 2016-12-11 VITALS — Ht 74.0 in | Wt 250.0 lb

## 2016-12-11 DIAGNOSIS — I87332 Chronic venous hypertension (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: Secondary | ICD-10-CM | POA: Insufficient documentation

## 2016-12-11 DIAGNOSIS — L97929 Non-pressure chronic ulcer of unspecified part of left lower leg with unspecified severity: Secondary | ICD-10-CM | POA: Insufficient documentation

## 2016-12-11 NOTE — Progress Notes (Signed)
Office Visit Note   Patient: [name]           Date of Birth: 1956/04/05           MRN: [id number] Visit Date: 12/11/2016              Requested by: Anda Kraft, MD [address] [place], [place] [address] PCP: [name], MD  Chief Complaint  Patient presents with  . Left Foot - Wound Check      HPI: Patient presents in follow-up for massive venous insufficiency left lower extremity with ulceration medially and laterally to the left foot secondary to his shoe wear being constricted. Patient has increased massive swelling of the left lower extremity today he states he did not use his compressive stocking for a week because he wanted to put cream on his leg.  Assessment & Plan: Visit Diagnoses:  1. Chronic venous hypertension (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 (HCC)     Plan: Patient is to resume wearing the medical compression stocking daily. He has a good extra wide extra-depth shoe with orthotics and a double break brace is to wear this at all times and use his crutches so he has good support  Follow-Up Instructions: Return in about 4 weeks (around 01/08/2017).   Ortho Exam  Patient is alert, oriented, no adenopathy, well-dressed, normal affect, normal respiratory effort. Examination patient has an antalgic gait is unsteady getting from a sitting to a standing position. Examination of the medial ulcer over the MTP joint left foot is showing significant improvement this is 10 mm in diameter and has about 50% granulation tissue 50% fibrinous exudative tissue. Patient has a good dorsalis pedis pulse he has massive pitting edema of the left lower extremity. The ulcer over the base of the fifth metatarsal is almost completely healed and this is 1 x 5 mm in 0.1 mm deep. There is no redness no cellulitis no odor no signs of infection.  Imaging: No results found.  Labs: Lab Results  Component Value Date   HGBA1C 8.6 (H)  05/04/2016   HGBA1C 9.2 (H) 04/25/2016   HGBA1C 6.8 (H) 12/17/2015   ESRSEDRATE 35 (H) 09/03/2016   ESRSEDRATE 111 (H) 05/11/2016   ESRSEDRATE 98 (H) 05/04/2016   CRP 1.2 (H) 09/03/2016   CRP 4.7 (H) 05/11/2016   CRP 8.0 (H) 05/04/2016   REPTSTATUS 05/09/2016 FINAL 05/04/2016   GRAMSTAIN  09/26/2014    NO WBC SEEN NO SQUAMOUS EPITHELIAL CELLS SEEN NO ORGANISMS SEEN Performed at [facility]    CULT NO GROWTH 5 DAYS 05/04/2016   LABORGA STAPHYLOCOCCUS AUREUS 09/26/2014    Orders:  No orders of the defined types were placed in this encounter.  No orders of the defined types were placed in this encounter.    Procedures: No procedures performed  Clinical Data: No additional findings.  ROS:  All other systems negative, except as noted in the HPI. Review of Systems  Objective: Vital Signs: Ht 6\' 2"  (1.88 m)   Wt 250 lb (113.4 kg)   BMI 32.10 kg/m   Specialty Comments:  No specialty comments available.  PMFS History: Patient Active Problem List   Diagnosis Date Noted  . Chronic venous hypertension (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 ([place]) 12/11/2016  . Non-pressure chronic ulcer of other part of left foot limited to breakdown of skin ([place]) 11/12/2016  . Unilateral primary osteoarthritis, left knee 10/11/2016  . Midfoot ulcer,  left, limited to breakdown of skin ([place]) 10/11/2016  . Hypertension 07/30/2016  . History of right below knee amputation ([place]) 07/12/2016  . Poorly controlled type 2 diabetes mellitus with peripheral neuropathy ([place]) 05/09/2016  . Acute osteomyelitis of left foot ([place])   . Pressure ulcer 05/08/2016  . Diabetic foot infection ([place]) 05/04/2016  . Diabetic foot ([place]) 05/04/2016  . Osteomyelitis of ankle or foot, acute ([place]) 04/25/2016  . Type 2 diabetes mellitus with insulin therapy ([place]) 12/16/2015  . Amputated toe ([place]) 10/21/2015  . Diabetic foot ulcer ([place]) 10/12/2015  . Cellulitis of left foot  09/03/2015  . Leg edema, left 09/03/2015  . Acute renal failure ([place]) 09/03/2015  . CAD (dz of distal, mid and proximal RCA with implantation of 3 overlapping drug-eluting stent,) 09/03/2015  . Chronic diastolic heart failure, NYHA class 2 ([place]) 09/03/2015  . Opacity of lung on imaging study 01/14/2015  . Angina pectoris associated with type 2 diabetes mellitus ([place]) 10/28/2014  . Hyperlipidemia 08/25/2014  . Chronic total occlusion of artery of the extremities ([place]) 04/08/2012  . Onychomycosis 02/01/2012  . Diabetes mellitus, insulin dependent (IDDM), controlled ([place]) 11/01/2011  . Occlusion and stenosis of carotid artery without mention of cerebral infarction 06/12/2011  . Aftercare following surgery of the circulatory system, [place] 06/12/2011  . GERD (gastroesophageal reflux disease) 05/08/2011  . Barrett's esophagus without dysplasia 05/08/2011  . PVD (peripheral vascular disease) ([place]) 02/01/2011  . HTN (hypertension) 02/01/2011  . Former tobacco use 02/01/2011   Past Medical History:  Diagnosis Date  . Barrett's esophagus   . Carotid artery occlusion 11/10/10   LEFT CAROTID ENDARTERECTOMY  . CHF (congestive heart failure) ([place])   . Complication of anesthesia    BP WENT UP AT [facility] "  . COPD (chronic obstructive pulmonary disease) ([place])   . Coronary artery disease   . Diabetes mellitus   . Diverticulosis of colon (without mention of hemorrhage)   . DJD (degenerative joint disease)   . Emphysema of lung ([place])   . Fatty liver   . Full dentures   . GERD (gastroesophageal reflux disease)   . H/O Doppler ultrasound 06/28/2012   Lower venous duplex left-- findings consistent with acute deep vein thrombosis involving the personal vein of the left lower extremity. No evidence of Baker's cyst on the left. Duplex scan of the left lower extremity arteries reveal adequate blood flow.   . H/O hiatal hernia   . History of cardiovascular stress test 03/23/2011   ejection fraction measured  51%, with an end-diastolic volume of 976 ml and an end-systolic volume of 81 ml. no evidence of myocardial ischemia.  Marland Kitchen History of echocardiogram 08/22/2011   Mildly hypertrophic left ventricle with normal systolic function and mild diastolic dysfunction. mildly dilated left atrium. no significant valvular abnormalities. no pericardial effusion no change from previous study 10/2010. the ejection fraction =>55%  . History of Holter monitoring 02/14/2012   Scan quality is fair due to baseline artifact. Predominant rhythm is Sinus. Veentricular Ectopy was noted in single beat form. The total recording time was 24:00 hours. total beats =104061. the total time analyzed was 23:58 hours. Heart  Max = 102 at 1:18:22, Min= 52 at 9:56:19 am., Avg= 70. There were 0 pause(s) > 2.5 seconds. the longest pause = 0.00 sec. at 1:51 pm(1)  . Hyperlipidemia   . Hypertension   . Neuromuscular disorder ([place])    peripheral neuropathy  . OSA (obstructive sleep apnea) 12/26/08   AHI of 6.3/hr overall, 32.7/hr  during REM sleep and an RDI of 24.8/hr and 50.9/hr.  . Osteomyelitis ([place])   . Osteomyelitis ([place])    left 5th metatarsal  . PAD (peripheral artery disease) ([place])    Distal aortogram June 2012. Atherectomy left popliteal artery July 2012.   . Sleep apnea    has not used CPAP in e years - took self off  . Slurred speech    AS PER WIFE IN D/C NOTE 11/10/10  . Substernal chest pain   . Visual color changes    LEFT EYE; BUT NOT TOTAL BLINDNESS  . Wears glasses     Family History  Problem Relation Age of Onset  . Heart disease Father     Before age 68-  CAD, BPG  . Diabetes Father     Amputation  . Cancer Father     PROSTATE  . Hyperlipidemia Father   . Hypertension Father   . Heart attack Father     Triple BPG  . Varicose Veins Father   . Colon cancer Brother   . Cancer Brother     Colon  . Diabetes Brother   . Heart disease Brother 18    A-Fib. Before age 19  . Hyperlipidemia Brother   .  Hypertension Brother   . Cancer Sister     Breast  . Hyperlipidemia Sister   . Hypertension Sister   . Hypertension Son   . Arthritis      GRANDMOTHER  . Hypertension      OTHER FAMILY MEMBERS    Past Surgical History:  Procedure Laterality Date  . AMPUTATION  11/05/2011   Procedure: AMPUTATION RAY;  Surgeon: Wylene Simmer, MD;  Location: [place];  Service: Orthopedics;  Laterality: Right;  Amputation of Right 4&5th Toes  . AMPUTATION Left 11/26/2012   Procedure: AMPUTATION RAY;  Surgeon: Wylene Simmer, MD;  Location: [facility];  Service: Orthopedics;  Laterality: Left;  fourth ray amputation  . AMPUTATION Right 08/27/2014   Procedure: Transmetatarsal Amputation;  Surgeon: Newt Minion, MD;  Location: [facility];  Service: Orthopedics;  Laterality: Right;  . AMPUTATION Right 01/14/2015   Procedure: AMPUTATION BELOW KNEE;  Surgeon: Newt Minion, MD;  Location: [place];  Service: Orthopedics;  Laterality: Right;  . AMPUTATION Left 10/21/2015   Procedure: Left Foot 5th Ray Amputation;  Surgeon: Newt Minion, MD;  Location: [place];  Service: Orthopedics;  Laterality: Left;  . ANTERIOR FUSION CERVICAL SPINE  02/06/06   C4-5, C5-6, C6-7; SURGEON DR. MAX COHEN  . CARDIAC CATHETERIZATION  10/31/04   2009  . CAROTID ENDARTERECTOMY  11/10/10  . CAROTID ENDARTERECTOMY Left 11/10/2010   Subtotal occlusion of left internal carotid artery with left hemispheric transient ischemic attacks.  . CARPAL TUNNEL RELEASE Right 10/21/2013   Procedure: RIGHT CARPAL TUNNEL RELEASE;  Surgeon: Wynonia Sours, MD;  Location: [place];  Service: Orthopedics;  Laterality: Right;  . CHOLECYSTECTOMY    . COLONOSCOPY    . ESOPHAGEAL MANOMETRY Bilateral 07/19/2014   Procedure: ESOPHAGEAL MANOMETRY (EM);  Surgeon: Jerene Bears, MD;  [facility];  Service: Gastroenterology;  Laterality: Bilateral;  . FEMORAL ARTERY STENT     x6  . FOOT SURGERY  04/25/2016    EXCISION BASE 5TH METATARSAL AND PARTIAL CUBOID LEFT FOOT    . HERNIA REPAIR     LEFT INGUINAL AND UMBILICAL REPAIRS  . I&D EXTREMITY Left 04/25/2016   Procedure: EXCISION BASE 5TH METATARSAL AND PARTIAL CUBOID LEFT FOOT;  Surgeon:  Newt Minion, MD;  Location: [place];  Service: Orthopedics;  Laterality: Left;  . JOINT REPLACEMENT Right 2001   Total  . LAMINECTOMY     X 3 LUMBAR AND X 2 CERVICAL SPINE OPERATIONS  . LAPAROSCOPIC CHOLECYSTECTOMY W/ CHOLANGIOGRAPHY  11/09/04   SURGEON DR. Luella Cook  . LEFT HEART CATHETERIZATION WITH CORONARY ANGIOGRAM N/A 10/29/2014   Procedure: LEFT HEART CATHETERIZATION WITH CORONARY ANGIOGRAM;  Surgeon: Laverda Page, MD;  Location: [facility] CATH LAB;  Service: Cardiovascular;  Laterality: N/A;  . LOWER EXTREMITY ANGIOGRAM N/A 03/19/2012   Procedure: LOWER EXTREMITY ANGIOGRAM;  Surgeon: Burnell Blanks, MD;  Location: [facility] CATH LAB;  Service: Cardiovascular;  Laterality: N/A;  . NECK SURGERY    . PENILE PROSTHESIS IMPLANT  08/14/05   INFRAPUBIC INSERTION OF INFLATABLE PENILE PROSTHESIS; SURGEON DR. Amalia Hailey  . PERCUTANEOUS CORONARY STENT INTERVENTION (PCI-S) Right 10/29/2014   Procedure: PERCUTANEOUS CORONARY STENT INTERVENTION (PCI-S);  Surgeon: Laverda Page, MD;  Location: [facility] CATH LAB;  Service: Cardiovascular;  Laterality: Right;  . SHOULDER ARTHROSCOPY    . SPINE SURGERY    . TONSILLECTOMY    . TOTAL KNEE ARTHROPLASTY  07/2002   RIGHT KNEE ; SURGEON  DR. [name] ALSO HAD ARTHROSCOPIC RIGHT KNEE IN  10/2001  . ULNAR NERVE TRANSPOSITION Right 10/21/2013   Procedure: RIGHT ELBOW  ULNAR NERVE DECOMPRESSION;  Surgeon: Wynonia Sours, MD;  Location: [place];  Service: Orthopedics;  Laterality: Right;   Social History   Occupational History  . TRUCK DRIVER Unemployed   Social History Main Topics  . Smoking status: Current Every Day Smoker    Packs/day: 0.50    Years: 35.00    Types: E-cigarettes    Last attempt to quit: 10/28/2011  . Smokeless tobacco: Never Used     Comment: VAPOR  CIGARETTES  . Alcohol use No     Comment: "not in a long time"  . Drug use: No  . Sexual activity: Yes    Birth control/ protection: Implant

## 2017-01-08 ENCOUNTER — Encounter (INDEPENDENT_AMBULATORY_CARE_PROVIDER_SITE_OTHER): Payer: Self-pay | Admitting: Orthopedic Surgery

## 2017-01-08 ENCOUNTER — Ambulatory Visit (INDEPENDENT_AMBULATORY_CARE_PROVIDER_SITE_OTHER): Payer: BLUE CROSS/BLUE SHIELD | Admitting: Orthopedic Surgery

## 2017-01-08 DIAGNOSIS — I739 Peripheral vascular disease, unspecified: Secondary | ICD-10-CM | POA: Diagnosis not present

## 2017-01-08 DIAGNOSIS — L97521 Non-pressure chronic ulcer of other part of left foot limited to breakdown of skin: Secondary | ICD-10-CM

## 2017-01-08 DIAGNOSIS — B351 Tinea unguium: Secondary | ICD-10-CM | POA: Diagnosis not present

## 2017-01-08 MED ORDER — NITROGLYCERIN 0.2 MG/HR TD PT24: 0.2000 mg | MEDICATED_PATCH | Freq: Every day | TRANSDERMAL | 12 refills | Status: DC

## 2017-01-08 NOTE — Progress Notes (Signed)
Office Visit Note   Patient: [name]           Date of Birth: 1955/12/18           MRN: [id number] Visit Date: 01/08/2017              Requested by: Anda Kraft, MD [address] [place], [place] [address] PCP: [name], MD  Chief Complaint  Patient presents with  . Left Leg - Follow-up      HPI: Patient presents in follow-up for venous insufficiency left lower extremity with ulceration medially to the left foot secondary to his shoe wear being constricted. Has been in Vive compression stockings last 4 days. Trace edema. Pleased the lateral ulcer has healed. Wonders how to expedite healing of medial ulcer. Has gotten new extra wide ED shoe for left foot. Complains the right and left heels/shoes are uneven.  Assessment & Plan: Visit Diagnoses:  1. Non-pressure chronic ulcer of other part of left foot limited to breakdown of skin ([place])   2. Onychomycosis   3. PVD (peripheral vascular disease) ([place])     Plan: Patient is to continue wearing the medical compression stocking daily. He has a good extra wide extra-depth shoe with orthotics and a double upright brace. is to wear this at all times and use his crutches so he has good support  Follow-Up Instructions: Return in about 4 weeks (around 02/05/2017).   Ortho Exam  Patient is alert, oriented, no adenopathy, well-dressed, normal affect, normal respiratory effort. Examination patient has an antalgic gait is unsteady getting from a sitting to a standing position. Examination of the medial ulcer over the MTP joint left foot is showing significant improvement this is 10 mm in diameter and has about 100% granulation tissue. Patient has a palpable dorsalis pedis pulse. he has trace edema of the left lower extremity. The ulcer over the base of the fifth metatarsal is completely healed. There is no redness no cellulitis no odor no signs of infection. Does complain of elevation of the great toenail. There is  thickened and onychomycotic nails 3. These were trimmed without incident. He is unable to safely trim his own nails due to insensate neuropathy.  Imaging: No results found.  Labs: Lab Results  Component Value Date   HGBA1C 8.6 (H) 05/04/2016   HGBA1C 9.2 (H) 04/25/2016   HGBA1C 6.8 (H) 12/17/2015   ESRSEDRATE 35 (H) 09/03/2016   ESRSEDRATE 111 (H) 05/11/2016   ESRSEDRATE 98 (H) 05/04/2016   CRP 1.2 (H) 09/03/2016   CRP 4.7 (H) 05/11/2016   CRP 8.0 (H) 05/04/2016   REPTSTATUS 05/09/2016 FINAL 05/04/2016   GRAMSTAIN  09/26/2014    NO WBC SEEN NO SQUAMOUS EPITHELIAL CELLS SEEN NO ORGANISMS SEEN Performed at [facility]    CULT NO GROWTH 5 DAYS 05/04/2016   LABORGA STAPHYLOCOCCUS AUREUS 09/26/2014    Orders:  No orders of the defined types were placed in this encounter.  No orders of the defined types were placed in this encounter.    Procedures: No procedures performed  Clinical Data: No additional findings.  ROS:  All other systems negative, except as noted in the HPI. Review of Systems  Constitutional: Negative for chills and fever.  Skin: Positive for wound.    Objective: Vital Signs: There were no vitals taken for this visit.  Specialty Comments:  No specialty comments available.  PMFS History: Patient Active Problem List   Diagnosis Date Noted  . Chronic venous hypertension (  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 ([place]) 12/11/2016  . Non-pressure chronic ulcer of other part of left foot limited to breakdown of skin ([place]) 11/12/2016  . Unilateral primary osteoarthritis, left knee 10/11/2016  . Midfoot ulcer, left, limited to breakdown of skin ([place]) 10/11/2016  . Hypertension 07/30/2016  . History of right below knee amputation ([place]) 07/12/2016  . Poorly controlled type 2 diabetes mellitus with peripheral neuropathy ([place]) 05/09/2016  . Pressure ulcer 05/08/2016  . Diabetic foot infection ([place]) 05/04/2016  . Diabetic  foot ([place]) 05/04/2016  . Type 2 diabetes mellitus with insulin therapy ([place]) 12/16/2015  . Amputated toe ([place]) 10/21/2015  . Diabetic foot ulcer ([place]) 10/12/2015  . Leg edema, left 09/03/2015  . Acute renal failure ([place]) 09/03/2015  . CAD (dz of distal, mid and proximal RCA with implantation of 3 overlapping drug-eluting stent,) 09/03/2015  . Chronic diastolic heart failure, NYHA class 2 ([place]) 09/03/2015  . Opacity of lung on imaging study 01/14/2015  . Angina pectoris associated with type 2 diabetes mellitus ([place]) 10/28/2014  . Hyperlipidemia 08/25/2014  . Chronic total occlusion of artery of the extremities ([place]) 04/08/2012  . Onychomycosis 02/01/2012  . Diabetes mellitus, insulin dependent (IDDM), controlled ([place]) 11/01/2011  . Occlusion and stenosis of carotid artery without mention of cerebral infarction 06/12/2011  . Aftercare following surgery of the circulatory system, [place] 06/12/2011  . GERD (gastroesophageal reflux disease) 05/08/2011  . Barrett's esophagus without dysplasia 05/08/2011  . PVD (peripheral vascular disease) ([place]) 02/01/2011  . HTN (hypertension) 02/01/2011  . Former tobacco use 02/01/2011   Past Medical History:  Diagnosis Date  . Barrett's esophagus   . Carotid artery occlusion 11/10/10   LEFT CAROTID ENDARTERECTOMY  . CHF (congestive heart failure) ([place])   . Complication of anesthesia    BP WENT UP AT [facility] "  . COPD (chronic obstructive pulmonary disease) ([place])   . Coronary artery disease   . Diabetes mellitus   . Diverticulosis of colon (without mention of hemorrhage)   . DJD (degenerative joint disease)   . Emphysema of lung ([place])   . Fatty liver   . Full dentures   . GERD (gastroesophageal reflux disease)   . H/O Doppler ultrasound 06/28/2012   Lower venous duplex left-- findings consistent with acute deep vein thrombosis involving the personal vein of the left lower extremity. No evidence of Baker's cyst on the left. Duplex scan of the left lower  extremity arteries reveal adequate blood flow.   . H/O hiatal hernia   . History of cardiovascular stress test 03/23/2011   ejection fraction measured 51%, with an end-diastolic volume of 371 ml and an end-systolic volume of 81 ml. no evidence of myocardial ischemia.  Marland Kitchen History of echocardiogram 08/22/2011   Mildly hypertrophic left ventricle with normal systolic function and mild diastolic dysfunction. mildly dilated left atrium. no significant valvular abnormalities. no pericardial effusion no change from previous study 10/2010. the ejection fraction =>55%  . History of Holter monitoring 02/14/2012   Scan quality is fair due to baseline artifact. Predominant rhythm is Sinus. Veentricular Ectopy was noted in single beat form. The total recording time was 24:00 hours. total beats =[id number]. the total time analyzed was 23:58 hours. Heart  Max = 102 at 1:18:22, Min= 52 at 9:56:19 am., Avg= 70. There were 0 pause(s) > 2.5 seconds. the longest pause = 0.00 sec. at 1:51 pm(1)  . Hyperlipidemia   . Hypertension   . Neuromuscular disorder ([place])    peripheral  neuropathy  . OSA (obstructive sleep apnea) 12/26/08   AHI of 6.3/hr overall, 32.7/hr during REM sleep and an RDI of 24.8/hr and 50.9/hr.  . Osteomyelitis ([place])   . Osteomyelitis ([place])    left 5th metatarsal  . PAD (peripheral artery disease) ([place])    Distal aortogram June 2012. Atherectomy left popliteal artery July 2012.   . Sleep apnea    has not used CPAP in e years - took self off  . Slurred speech    AS PER WIFE IN D/C NOTE 11/10/10  . Substernal chest pain   . Visual color changes    LEFT EYE; BUT NOT TOTAL BLINDNESS  . Wears glasses     Family History  Problem Relation Age of Onset  . Heart disease Father     Before age 52-  CAD, BPG  . Diabetes Father     Amputation  . Cancer Father     PROSTATE  . Hyperlipidemia Father   . Hypertension Father   . Heart attack Father     Triple BPG  . Varicose Veins Father   . Colon cancer  Brother   . Cancer Brother     Colon  . Diabetes Brother   . Heart disease Brother 16    A-Fib. Before age 63  . Hyperlipidemia Brother   . Hypertension Brother   . Cancer Sister     Breast  . Hyperlipidemia Sister   . Hypertension Sister   . Hypertension Son   . Arthritis      GRANDMOTHER  . Hypertension      OTHER FAMILY MEMBERS    Past Surgical History:  Procedure Laterality Date  . AMPUTATION  11/05/2011   Procedure: AMPUTATION RAY;  Surgeon: Wylene Simmer, MD;  Location: [facility];  Service: Orthopedics;  Laterality: Right;  Amputation of Right 4&5th Toes  . AMPUTATION Left 11/26/2012   Procedure: AMPUTATION RAY;  Surgeon: Wylene Simmer, MD;  Location: [place];  Service: Orthopedics;  Laterality: Left;  fourth ray amputation  . AMPUTATION Right 08/27/2014   Procedure: Transmetatarsal Amputation;  Surgeon: Newt Minion, MD;  Location: [place];  Service: Orthopedics;  Laterality: Right;  . AMPUTATION Right 01/14/2015   Procedure: AMPUTATION BELOW KNEE;  Surgeon: Newt Minion, MD;  Location: [place];  Service: Orthopedics;  Laterality: Right;  . AMPUTATION Left 10/21/2015   Procedure: Left Foot 5th Ray Amputation;  Surgeon: Newt Minion, MD;  Location: [place];  Service: Orthopedics;  Laterality: Left;  . ANTERIOR FUSION CERVICAL SPINE  02/06/06   C4-5, C5-6, C6-7; SURGEON DR. MAX COHEN  . CARDIAC CATHETERIZATION  10/31/04   2009  . CAROTID ENDARTERECTOMY  11/10/10  . CAROTID ENDARTERECTOMY Left 11/10/2010   Subtotal occlusion of left internal carotid artery with left hemispheric transient ischemic attacks.  . CARPAL TUNNEL RELEASE Right 10/21/2013   Procedure: RIGHT CARPAL TUNNEL RELEASE;  Surgeon: Wynonia Sours, MD;  Location: [place];  Service: Orthopedics;  Laterality: Right;  . CHOLECYSTECTOMY    . COLONOSCOPY    . ESOPHAGEAL MANOMETRY Bilateral 07/19/2014   Procedure: ESOPHAGEAL MANOMETRY (EM);  Surgeon: Jerene Bears, MD;  Location: [facility];  Service: Gastroenterology;   Laterality: Bilateral;  . FEMORAL ARTERY STENT     x6  . FOOT SURGERY  04/25/2016    EXCISION BASE 5TH METATARSAL AND PARTIAL CUBOID LEFT FOOT  . HERNIA REPAIR     LEFT INGUINAL AND UMBILICAL REPAIRS  . I&D EXTREMITY Left 04/25/2016  Procedure: EXCISION BASE 5TH METATARSAL AND PARTIAL CUBOID LEFT FOOT;  Surgeon: Newt Minion, MD;  Location: [facility];  Service: Orthopedics;  Laterality: Left;  . JOINT REPLACEMENT Right 2001   Total  . LAMINECTOMY     X 3 LUMBAR AND X 2 CERVICAL SPINE OPERATIONS  . LAPAROSCOPIC CHOLECYSTECTOMY W/ CHOLANGIOGRAPHY  11/09/04   SURGEON DR. Luella Cook  . LEFT HEART CATHETERIZATION WITH CORONARY ANGIOGRAM N/A 10/29/2014   Procedure: LEFT HEART CATHETERIZATION WITH CORONARY ANGIOGRAM;  Surgeon: Laverda Page, MD;  Location: [facility] LAB;  Service: Cardiovascular;  Laterality: N/A;  . LOWER EXTREMITY ANGIOGRAM N/A 03/19/2012   Procedure: LOWER EXTREMITY ANGIOGRAM;  Surgeon: Burnell Blanks, MD;  Location: [facility] CATH LAB;  Service: Cardiovascular;  Laterality: N/A;  . NECK SURGERY    . PENILE PROSTHESIS IMPLANT  08/14/05   INFRAPUBIC INSERTION OF INFLATABLE PENILE PROSTHESIS; SURGEON DR. Amalia Hailey  . PERCUTANEOUS CORONARY STENT INTERVENTION (PCI-S) Right 10/29/2014   Procedure: PERCUTANEOUS CORONARY STENT INTERVENTION (PCI-S);  Surgeon: Laverda Page, MD;  Location: [facility] CATH LAB;  Service: Cardiovascular;  Laterality: Right;  . SHOULDER ARTHROSCOPY    . SPINE SURGERY    . TONSILLECTOMY    . TOTAL KNEE ARTHROPLASTY  07/2002   RIGHT KNEE ; SURGEON  DR. [name] ALSO HAD ARTHROSCOPIC RIGHT KNEE IN  10/2001  . ULNAR NERVE TRANSPOSITION Right 10/21/2013   Procedure: RIGHT ELBOW  ULNAR NERVE DECOMPRESSION;  Surgeon: Wynonia Sours, MD;  Location: [place];  Service: Orthopedics;  Laterality: Right;   Social History   Occupational History  . TRUCK DRIVER Unemployed   Social History Main Topics  . Smoking status: Current Every Day Smoker     Packs/day: 0.50    Years: 35.00    Types: E-cigarettes    Last attempt to quit: 10/28/2011  . Smokeless tobacco: Never Used     Comment: VAPOR CIGARETTES  . Alcohol use No     Comment: "not in a long time"  . Drug use: No  . Sexual activity: Yes    Birth control/ protection: Implant

## 2017-01-21 ENCOUNTER — Encounter: Payer: Self-pay | Admitting: Family

## 2017-01-23 ENCOUNTER — Encounter (HOSPITAL_COMMUNITY): Payer: Medicare Other

## 2017-01-23 ENCOUNTER — Ambulatory Visit: Payer: Medicare Other | Admitting: Family

## 2017-02-05 ENCOUNTER — Ambulatory Visit (INDEPENDENT_AMBULATORY_CARE_PROVIDER_SITE_OTHER): Payer: BLUE CROSS/BLUE SHIELD | Admitting: Orthopedic Surgery

## 2017-02-05 ENCOUNTER — Encounter (INDEPENDENT_AMBULATORY_CARE_PROVIDER_SITE_OTHER): Payer: Self-pay | Admitting: Orthopedic Surgery

## 2017-02-05 VITALS — Ht 74.0 in | Wt 250.0 lb

## 2017-02-05 DIAGNOSIS — L97929 Non-pressure chronic ulcer of unspecified part of left lower leg with unspecified severity: Secondary | ICD-10-CM

## 2017-02-05 DIAGNOSIS — L97521 Non-pressure chronic ulcer of other part of left foot limited to breakdown of skin: Secondary | ICD-10-CM | POA: Diagnosis not present

## 2017-02-05 DIAGNOSIS — Z89511 Acquired absence of right leg below knee: Secondary | ICD-10-CM

## 2017-02-05 DIAGNOSIS — I87332 Chronic venous hypertension (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: Secondary | ICD-10-CM

## 2017-02-05 NOTE — Progress Notes (Signed)
Office Visit Note   Patient: [name]           Date of Birth: 1955-09-13           MRN: [id number] Visit Date: 02/05/2017              Requested by: Anda Kraft, MD [address] [place], [place] [address] PCP: Anda Kraft, MD  Chief Complaint  Patient presents with  . Left Foot - Follow-up      HPI: Patient presents in follow-up for venous stasis insufficiency ulceration to the left foot a right transtibial amputation as well as ulceration blistering to the left foot. Patient is currently wearing a medical compression stocking on the left he has double upright brace is with extra-depth shoes with custom orthotics.  Assessment & Plan: Visit Diagnoses:  1. Non-pressure chronic ulcer of other part of left foot limited to breakdown of skin ([place])   2. Chronic venous hypertension (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 (HCC)   3. History of right below knee amputation ([place])     Plan: Recommend patient change his socks twice a day he does have increased drainage which is causing the maceration on the plantar aspect of the left foot. He will change the sock twice a day. Ensure no wrinkles.  Follow-Up Instructions: Return in about 4 weeks (around 03/05/2017).   Ortho Exam  Patient is alert, oriented, no adenopathy, well-dressed, normal affect, normal respiratory effort. Examination patient does have pitting edema the left lower extremity but there is only 1 ulcer dorsally over the ankle which is less than 10 mm in diameter 0.1 mm deep that has good beefy granulation tissue there is no cellulitis no drainage palpation is not tender to palpation there is no abscess or fluid collection. Examination the plantar aspect of his foot he does have maceration with blistered skin consistent with fungal growth secondary to the maceration from drainage. He also has a small ischemic ulcer dorsally over the great toe is using a nitroglycerin patch and this  has 100% beefy granulation tissue is also less than 10 mm in diameter. He has a stable right transtibial amputation wearing his prosthesis.  Imaging: No results found.  Labs: Lab Results  Component Value Date   HGBA1C 8.6 (H) 05/04/2016   HGBA1C 9.2 (H) 04/25/2016   HGBA1C 6.8 (H) 12/17/2015   ESRSEDRATE 35 (H) 09/03/2016   ESRSEDRATE 111 (H) 05/11/2016   ESRSEDRATE 98 (H) 05/04/2016   CRP 1.2 (H) 09/03/2016   CRP 4.7 (H) 05/11/2016   CRP 8.0 (H) 05/04/2016   REPTSTATUS 05/09/2016 FINAL 05/04/2016   GRAMSTAIN  09/26/2014    NO WBC SEEN NO SQUAMOUS EPITHELIAL CELLS SEEN NO ORGANISMS SEEN Performed at [facility]    CULT NO GROWTH 5 DAYS 05/04/2016   LABORGA STAPHYLOCOCCUS AUREUS 09/26/2014    Orders:  No orders of the defined types were placed in this encounter.  No orders of the defined types were placed in this encounter.    Procedures: No procedures performed  Clinical Data: No additional findings.  ROS:  All other systems negative, except as noted in the HPI. Review of Systems  Objective: Vital Signs: Ht 6\' 2"  (1.88 m)   Wt 250 lb (113.4 kg)   BMI 32.10 kg/m   Specialty Comments:  No specialty comments available.  PMFS History: Patient Active Problem List   Diagnosis Date Noted  . Chronic venous hypertension (idiopathic) with ulcer and inflammation of  left lower extremity ([place]) 12/11/2016  . Non-pressure chronic ulcer of other part of left foot limited to breakdown of skin ([place]) 11/12/2016  . Unilateral primary osteoarthritis, left knee 10/11/2016  . Midfoot ulcer, left, limited to breakdown of skin ([place]) 10/11/2016  . Hypertension 07/30/2016  . History of right below knee amputation ([place]) 07/12/2016  . Poorly controlled type 2 diabetes mellitus with peripheral neuropathy ([place]) 05/09/2016  . Pressure ulcer 05/08/2016  . Diabetic foot infection ([place]) 05/04/2016  . Diabetic foot ([place]) 05/04/2016  . Type 2 diabetes mellitus with insulin  therapy ([place]) 12/16/2015  . Amputated toe ([place]) 10/21/2015  . Diabetic foot ulcer ([place]) 10/12/2015  . Leg edema, left 09/03/2015  . Acute renal failure ([place]) 09/03/2015  . CAD (dz of distal, mid and proximal RCA with implantation of 3 overlapping drug-eluting stent,) 09/03/2015  . Chronic diastolic heart failure, NYHA class 2 ([place]) 09/03/2015  . Opacity of lung on imaging study 01/14/2015  . Angina pectoris associated with type 2 diabetes mellitus ([place]) 10/28/2014  . Hyperlipidemia 08/25/2014  . Chronic total occlusion of artery of the extremities ([place]) 04/08/2012  . Onychomycosis 02/01/2012  . Diabetes mellitus, insulin dependent (IDDM), controlled ([place]) 11/01/2011  . Occlusion and stenosis of carotid artery without mention of cerebral infarction 06/12/2011  . Aftercare following surgery of the circulatory system, [place] 06/12/2011  . GERD (gastroesophageal reflux disease) 05/08/2011  . Barrett's esophagus without dysplasia 05/08/2011  . PVD (peripheral vascular disease) ([place]) 02/01/2011  . HTN (hypertension) 02/01/2011  . Former tobacco use 02/01/2011   Past Medical History:  Diagnosis Date  . Barrett's esophagus   . Carotid artery occlusion 11/10/10   LEFT CAROTID ENDARTERECTOMY  . CHF (congestive heart failure) ([place])   . Complication of anesthesia    BP WENT UP AT [facility] "  . COPD (chronic obstructive pulmonary disease) ([place])   . Coronary artery disease   . Diabetes mellitus   . Diverticulosis of colon (without mention of hemorrhage)   . DJD (degenerative joint disease)   . Emphysema of lung ([place])   . Fatty liver   . Full dentures   . GERD (gastroesophageal reflux disease)   . H/O Doppler ultrasound 06/28/2012   Lower venous duplex left-- findings consistent with acute deep vein thrombosis involving the personal vein of the left lower extremity. No evidence of Baker's cyst on the left. Duplex scan of the left lower extremity arteries reveal adequate blood flow.   . H/O hiatal  hernia   . History of cardiovascular stress test 03/23/2011   ejection fraction measured 51%, with an end-diastolic volume of 470 ml and an end-systolic volume of 81 ml. no evidence of myocardial ischemia.  Marland Kitchen History of echocardiogram 08/22/2011   Mildly hypertrophic left ventricle with normal systolic function and mild diastolic dysfunction. mildly dilated left atrium. no significant valvular abnormalities. no pericardial effusion no change from previous study 10/2010. the ejection fraction =>55%  . History of Holter monitoring 02/14/2012   Scan quality is fair due to baseline artifact. Predominant rhythm is Sinus. Veentricular Ectopy was noted in single beat form. The total recording time was 24:00 hours. total beats =[id number]. the total time analyzed was 23:58 hours. Heart  Max = 102 at 1:18:22, Min= 52 at 9:56:19 am., Avg= 70. There were 0 pause(s) > 2.5 seconds. the longest pause = 0.00 sec. at 1:51 pm(1)  . Hyperlipidemia   . Hypertension   . Neuromuscular disorder ([place])    peripheral neuropathy  . OSA (obstructive sleep  apnea) 12/26/08   AHI of 6.3/hr overall, 32.7/hr during REM sleep and an RDI of 24.8/hr and 50.9/hr.  . Osteomyelitis ([place])   . Osteomyelitis ([place])    left 5th metatarsal  . PAD (peripheral artery disease) ([place])    Distal aortogram June 2012. Atherectomy left popliteal artery July 2012.   . Sleep apnea    has not used CPAP in e years - took self off  . Slurred speech    AS PER WIFE IN D/C NOTE 11/10/10  . Substernal chest pain   . Visual color changes    LEFT EYE; BUT NOT TOTAL BLINDNESS  . Wears glasses     Family History  Problem Relation Age of Onset  . Heart disease Father        Before age 51-  CAD, BPG  . Diabetes Father        Amputation  . Cancer Father        PROSTATE  . Hyperlipidemia Father   . Hypertension Father   . Heart attack Father        Triple BPG  . Varicose Veins Father   . Colon cancer Brother   . Cancer Brother        Colon  . Diabetes  Brother   . Heart disease Brother 37       A-Fib. Before age 59  . Hyperlipidemia Brother   . Hypertension Brother   . Cancer Sister        Breast  . Hyperlipidemia Sister   . Hypertension Sister   . Hypertension Son   . Arthritis Unknown        GRANDMOTHER  . Hypertension Unknown        OTHER FAMILY MEMBERS    Past Surgical History:  Procedure Laterality Date  . AMPUTATION  11/05/2011   Procedure: AMPUTATION RAY;  Surgeon: Wylene Simmer, MD;  Location: [facility];  Service: Orthopedics;  Laterality: Right;  Amputation of Right 4&5th Toes  . AMPUTATION Left 11/26/2012   Procedure: AMPUTATION RAY;  Surgeon: Wylene Simmer, MD;  Location: [facility];  Service: Orthopedics;  Laterality: Left;  fourth ray amputation  . AMPUTATION Right 08/27/2014   Procedure: Transmetatarsal Amputation;  Surgeon: Newt Minion, MD;  Location: [facility];  Service: Orthopedics;  Laterality: Right;  . AMPUTATION Right 01/14/2015   Procedure: AMPUTATION BELOW KNEE;  Surgeon: Newt Minion, MD;  Location: [facility];  Service: Orthopedics;  Laterality: Right;  . AMPUTATION Left 10/21/2015   Procedure: Left Foot 5th Ray Amputation;  Surgeon: Newt Minion, MD;  Location: [facility];  Service: Orthopedics;  Laterality: Left;  . ANTERIOR FUSION CERVICAL SPINE  02/06/06   C4-5, C5-6, C6-7; SURGEON DR. MAX COHEN  . CARDIAC CATHETERIZATION  10/31/04   2009  . CAROTID ENDARTERECTOMY  11/10/10  . CAROTID ENDARTERECTOMY Left 11/10/2010   Subtotal occlusion of left internal carotid artery with left hemispheric transient ischemic attacks.  . CARPAL TUNNEL RELEASE Right 10/21/2013   Procedure: RIGHT CARPAL TUNNEL RELEASE;  Surgeon: Wynonia Sours, MD;  Location: [facility];  Service: Orthopedics;  Laterality: Right;  . CHOLECYSTECTOMY    . COLONOSCOPY    . ESOPHAGEAL MANOMETRY Bilateral 07/19/2014   Procedure: ESOPHAGEAL MANOMETRY (EM);  Surgeon: Jerene Bears, MD;  Location: [facility];  Service: Gastroenterology;  Laterality: Bilateral;  .  FEMORAL ARTERY STENT     x6  . FOOT SURGERY  04/25/2016    EXCISION BASE 5TH METATARSAL AND PARTIAL CUBOID  LEFT FOOT  . HERNIA REPAIR     LEFT INGUINAL AND UMBILICAL REPAIRS  . I&D EXTREMITY Left 04/25/2016   Procedure: EXCISION BASE 5TH METATARSAL AND PARTIAL CUBOID LEFT FOOT;  Surgeon: Newt Minion, MD;  Location: [facility];  Service: Orthopedics;  Laterality: Left;  . JOINT REPLACEMENT Right 2001   Total  . LAMINECTOMY     X 3 LUMBAR AND X 2 CERVICAL SPINE OPERATIONS  . LAPAROSCOPIC CHOLECYSTECTOMY W/ CHOLANGIOGRAPHY  11/09/04   SURGEON DR. Luella Cook  . LEFT HEART CATHETERIZATION WITH CORONARY ANGIOGRAM N/A 10/29/2014   Procedure: LEFT HEART CATHETERIZATION WITH CORONARY ANGIOGRAM;  Surgeon: Laverda Page, MD;  [facility];  Service: Cardiovascular;  Laterality: N/A;  . LOWER EXTREMITY ANGIOGRAM N/A 03/19/2012   Procedure: LOWER EXTREMITY ANGIOGRAM;  Surgeon: Burnell Blanks, MD;  Location: [facility] CATH LAB;  Service: Cardiovascular;  Laterality: N/A;  . NECK SURGERY    . PENILE PROSTHESIS IMPLANT  08/14/05   INFRAPUBIC INSERTION OF INFLATABLE PENILE PROSTHESIS; SURGEON DR. Amalia Hailey  . PERCUTANEOUS CORONARY STENT INTERVENTION (PCI-S) Right 10/29/2014   Procedure: PERCUTANEOUS CORONARY STENT INTERVENTION (PCI-S);  Surgeon: Laverda Page, MD;  Location: [facility] CATH LAB;  Service: Cardiovascular;  Laterality: Right;  . SHOULDER ARTHROSCOPY    . SPINE SURGERY    . TONSILLECTOMY    . TOTAL KNEE ARTHROPLASTY  07/2002   RIGHT KNEE ; SURGEON  DR. [name] ALSO HAD ARTHROSCOPIC RIGHT KNEE IN  10/2001  . ULNAR NERVE TRANSPOSITION Right 10/21/2013   Procedure: RIGHT ELBOW  ULNAR NERVE DECOMPRESSION;  Surgeon: Wynonia Sours, MD;  Location: [facility];  Service: Orthopedics;  Laterality: Right;   Social History   Occupational History  . TRUCK DRIVER Unemployed   Social History Main Topics  . Smoking status: Current Every Day Smoker    Packs/day: 0.50    Years: 35.00     Types: E-cigarettes    Last attempt to quit: 10/28/2011  . Smokeless tobacco: Never Used     Comment: VAPOR CIGARETTES  . Alcohol use No     Comment: "not in a long time"  . Drug use: No  . Sexual activity: Yes    Birth control/ protection: Implant

## 2017-02-18 ENCOUNTER — Encounter: Payer: Self-pay | Admitting: Family

## 2017-03-01 ENCOUNTER — Ambulatory Visit (INDEPENDENT_AMBULATORY_CARE_PROVIDER_SITE_OTHER): Payer: BLUE CROSS/BLUE SHIELD | Admitting: Family

## 2017-03-01 ENCOUNTER — Ambulatory Visit (HOSPITAL_COMMUNITY)
Admission: RE | Admit: 2017-03-01 | Discharge: 2017-03-01 | Disposition: A | Discharge status: Home / Self Care | Payer: BLUE CROSS/BLUE SHIELD | Source: Ambulatory Visit | Attending: Vascular Surgery | Admitting: Vascular Surgery

## 2017-03-01 ENCOUNTER — Encounter: Payer: Self-pay | Admitting: Family

## 2017-03-01 ENCOUNTER — Ambulatory Visit (INDEPENDENT_AMBULATORY_CARE_PROVIDER_SITE_OTHER)
Admission: RE | Admit: 2017-03-01 | Discharge: 2017-03-01 | Disposition: A | Discharge status: Home / Self Care | Payer: BLUE CROSS/BLUE SHIELD | Source: Ambulatory Visit | Attending: Vascular Surgery | Admitting: Vascular Surgery

## 2017-03-01 VITALS — BP 123/67 | HR 66 | Temp 98.9°F | Resp 20 | Ht 74.0 in | Wt 250.0 lb

## 2017-03-01 DIAGNOSIS — Z9889 Other specified postprocedural states: Secondary | ICD-10-CM | POA: Diagnosis present

## 2017-03-01 DIAGNOSIS — I779 Disorder of arteries and arterioles, unspecified: Secondary | ICD-10-CM | POA: Diagnosis not present

## 2017-03-01 DIAGNOSIS — M216X2 Other acquired deformities of left foot: Secondary | ICD-10-CM

## 2017-03-01 DIAGNOSIS — Z89511 Acquired absence of right leg below knee: Secondary | ICD-10-CM

## 2017-03-01 DIAGNOSIS — Z87891 Personal history of nicotine dependence: Secondary | ICD-10-CM

## 2017-03-01 DIAGNOSIS — R030 Elevated blood-pressure reading, without diagnosis of hypertension: Secondary | ICD-10-CM

## 2017-03-01 DIAGNOSIS — I6521 Occlusion and stenosis of right carotid artery: Secondary | ICD-10-CM | POA: Diagnosis not present

## 2017-03-01 DIAGNOSIS — I6522 Occlusion and stenosis of left carotid artery: Secondary | ICD-10-CM | POA: Diagnosis not present

## 2017-03-01 LAB — VAS US CAROTID
LEFT ECA DIAS: 0 cm/s
LEFT VERTEBRAL DIAS: -11 cm/s
Left CCA dist dias: -17 cm/s
Left CCA dist sys: -152 cm/s
Left CCA prox dias: 15 cm/s
Left CCA prox sys: 127 cm/s
Left ICA dist dias: -24 cm/s
Left ICA dist sys: -102 cm/s
Left ICA prox dias: -16 cm/s
Left ICA prox sys: -58 cm/s
RIGHT CCA MID DIAS: -14 cm/s
RIGHT ECA DIAS: 0 cm/s
RIGHT VERTEBRAL DIAS: 12 cm/s
Right CCA prox dias: -16 cm/s
Right CCA prox sys: -134 cm/s
Right cca dist sys: -89 cm/s

## 2017-03-01 NOTE — Patient Instructions (Signed)
Peripheral Vascular Disease Peripheral vascular disease (PVD) is a disease of the blood vessels that are not part of your heart and brain. A simple term for PVD is poor circulation. In most cases, PVD narrows the blood vessels that carry blood from your heart to the rest of your body. This can result in a decreased supply of blood to your arms, legs, and internal organs, like your stomach or kidneys. However, it most often affects a person's lower legs and feet. There are two types of PVD.  Organic PVD. This is the more common type. It is caused by damage to the structure of blood vessels.  Functional PVD. This is caused by conditions that make blood vessels contract and tighten (spasm). Without treatment, PVD tends to get worse over time. PVD can also lead to acute ischemic limb. This is when an arm or limb suddenly has trouble getting enough blood. This is a medical emergency. Follow these instructions at home:  Take medicines only as told by your doctor.  Do not use any tobacco products, including cigarettes, chewing tobacco, or electronic cigarettes. If you need help quitting, ask your doctor.  Lose weight if you are overweight, and maintain a healthy weight as told by your doctor.  Eat a diet that is low in fat and cholesterol. If you need help, ask your doctor.  Exercise regularly. Ask your doctor for some good activities for you.  Take good care of your feet.  Wear comfortable shoes that fit well.  Check your feet often for any cuts or sores. Contact a doctor if:  You have cramps in your legs while walking.  You have leg pain when you are at rest.  You have coldness in a leg or foot.  Your skin changes.  You are unable to get or have an erection (erectile dysfunction).  You have cuts or sores on your feet that are not healing. Get help right away if:  Your arm or leg turns cold and blue.  Your arms or legs become red, warm, swollen, painful, or numb.  You have  chest pain or trouble breathing.  You suddenly have weakness in your face, arm, or leg.  You become very confused or you cannot speak.  You suddenly have a very bad headache.  You suddenly cannot see. This information is not intended to replace advice given to you by your health care provider. Make sure you discuss any questions you have with your health care provider. Document Released: 11/21/2009 Document Revised: 02/02/2016 Document Reviewed: 02/04/2014 Elsevier Interactive Patient Education  2017 Elsevier Inc.    Stroke Prevention Some medical conditions and behaviors are associated with an increased chance of having a stroke. You may prevent a stroke by making healthy choices and managing medical conditions. How can I reduce my risk of having a stroke?  Stay physically active. Get at least 30 minutes of activity on most or all days.  Do not smoke. It may also be helpful to avoid exposure to secondhand smoke.  Limit alcohol use. Moderate alcohol use is considered to be:  No more than 2 drinks per day for men.  No more than 1 drink per day for nonpregnant women.  Eat healthy foods. This involves:  Eating 5 or more servings of fruits and vegetables a day.  Making dietary changes that address high blood pressure (hypertension), high cholesterol, diabetes, or obesity.  Manage your cholesterol levels.  Making food choices that are high in fiber and low in saturated fat,   trans fat, and cholesterol may control cholesterol levels.  Take any prescribed medicines to control cholesterol as directed by your health care provider.  Manage your diabetes.  Controlling your carbohydrate and sugar intake is recommended to manage diabetes.  Take any prescribed medicines to control diabetes as directed by your health care provider.  Control your hypertension.  Making food choices that are low in salt (sodium), saturated fat, trans fat, and cholesterol is recommended to manage  hypertension.  Ask your health care provider if you need treatment to lower your blood pressure. Take any prescribed medicines to control hypertension as directed by your health care provider.  If you are 18-39 years of age, have your blood pressure checked every 3-5 years. If you are 40 years of age or older, have your blood pressure checked every year.  Maintain a healthy weight.  Reducing calorie intake and making food choices that are low in sodium, saturated fat, trans fat, and cholesterol are recommended to manage weight.  Stop drug abuse.  Avoid taking birth control pills.  Talk to your health care provider about the risks of taking birth control pills if you are over 35 years old, smoke, get migraines, or have ever had a blood clot.  Get evaluated for sleep disorders (sleep apnea).  Talk to your health care provider about getting a sleep evaluation if you snore a lot or have excessive sleepiness.  Take medicines only as directed by your health care provider.  For some people, aspirin or blood thinners (anticoagulants) are helpful in reducing the risk of forming abnormal blood clots that can lead to stroke. If you have the irregular heart rhythm of atrial fibrillation, you should be on a blood thinner unless there is a good reason you cannot take them.  Understand all your medicine instructions.  Make sure that other conditions (such as anemia or atherosclerosis) are addressed. Get help right away if:  You have sudden weakness or numbness of the face, arm, or leg, especially on one side of the body.  Your face or eyelid droops to one side.  You have sudden confusion.  You have trouble speaking (aphasia) or understanding.  You have sudden trouble seeing in one or both eyes.  You have sudden trouble walking.  You have dizziness.  You have a loss of balance or coordination.  You have a sudden, severe headache with no known cause.  You have new chest pain or an  irregular heartbeat. Any of these symptoms may represent a serious problem that is an emergency. Do not wait to see if the symptoms will go away. Get medical help at once. Call your local emergency services (911 in [place] Do not drive yourself to the hospital. This information is not intended to replace advice given to you by your health care provider. Make sure you discuss any questions you have with your health care provider. Document Released: 10/04/2004 Document Revised: 02/02/2016 Document Reviewed: 02/27/2013 Elsevier Interactive Patient Education  2017 Elsevier Inc.   

## 2017-03-01 NOTE — Progress Notes (Signed)
[facility] [place] HISTORY AND PHYSICAL  CC: Follow up peripheral artery occlusive disease and extracranial carotid artery stenosis  History of Present Illness:   [name] is a 61 y.o. male patient of Dr. Kellie Simmering who is s/p left CEA on 11/10/2010, he returns today for follow up. Pt denies any history of stroke or TIA.  He also has severe PAD. 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; states that since then his DM improved.  He had a left 5th ray amputation in February 2017 by [name] for an infected ulcer, per pt. [name] was treating the ulcer on the lateral aspect of pt's left foot which has healed.  He is working with [employer] to redo the right BKA prosthetic which is loose and starting to cause an abrasion on his stump, and an appliance to prevent supination of his left foot.  He has 6 stents in his LE's.  ESI's did not help his back issues, is considering nerve stimulator in his back; legs giving out, this apparently is attributed to his back issues. He also has DM peripheral neuropathy.  Pt states Dr.George Adams [phone]) at [facility] told pt that he now has good circulation in his legs after he had 2 stents placed in each lower leg in 2013,  at [facility].  Dr. Andree Elk, Vascular surgeon in the [place] area, was following pt's PAD. Pt states he wants [place] to follow his PAD now.   [name] is his cardiologist.  He had 3 cardiac stents placed early 2016 by [name].  Pt indicates that his blood pressure is labile: over 330 systolic to 076 systolic; he felt symptomatic at 226 systolic; he is working with [name] re this.   Pt denies cough or dyspnea other than the occasional dyspnea he gets which is chronic.   Pt Diabetic: Yes, states in good control with last A1C at 6.1 Pt smoker: former smoker, quit February, 2013  He takes Plavix, ASA, and a statin.     Current Outpatient Prescriptions   Medication Sig Dispense Refill  . ALPRAZolam (XANAX) 0.25 MG tablet Take 0.25 mg by mouth daily.  0  . amLODipine-olmesartan (AZOR) 5-20 MG tablet Take 2 tablets by mouth every evening. 180 tablet 1  . aspirin EC 81 MG tablet Take 81 mg by mouth daily.    Marland Kitchen atorvastatin (LIPITOR) 40 MG tablet Take 40 mg by mouth daily.    . carvedilol (COREG) 25 MG tablet Take 1.5 tablets (37.5 mg total) by mouth 2 (two) times daily. 270 tablet 2  . clobetasol (TEMOVATE) 0.05 % external solution Apply 1 application topically daily as needed (dry scalp).    . clopidogrel (PLAVIX) 75 MG tablet Take 75 mg by mouth daily.     . clotrimazole-betamethasone (LOTRISONE) cream Apply 1 application topically daily as needed (dry skin - beard).     . collagenase (SANTYL) ointment Apply topically daily. (Patient taking differently: Apply 1 application topically daily. TO AFFECTED SITE(S)) 15 g 0  . fentaNYL (DURAGESIC - DOSED MCG/HR) 50 MCG/HR Place 1 patch (50 mcg total) onto the skin every 3 (three) days. 3 patch 0  . fluticasone (CUTIVATE) 0.05 % cream Apply 1 application topically 2 (two) times daily as needed (dry skin on face).     Marland Kitchen glyBURIDE-metformin (GLUCOVANCE) 2.5-500 MG per tablet Take 2 tablets by mouth 2 (two) times daily with a meal.     .  HYDROmorphone (DILAUDID) 2 MG tablet Take 1 tablet (2 mg total) by mouth at bedtime. (Patient taking differently: Take 2 mg by mouth at bedtime as needed (for breakthrough pain). ) 10 tablet 0  . insulin glargine (LANTUS) 100 UNIT/ML injection Inject 0.6 mLs (60 Units total) into the skin at bedtime. 10 mL 0  . Insulin Glulisine (APIDRA) 100 UNIT/ML Solostar Pen Inject 13 Units into the skin 3 (three) times daily with meals. (Patient taking differently: Inject 10 Units into the skin 3 (three) times daily as needed (for CBG). ) 15 mL 0  . isosorbide mononitrate (IMDUR) 120 MG 24 hr tablet Take 120 mg by mouth daily.    . Linaclotide (LINZESS) 145 MCG CAPS capsule Take 1  capsule (145 mcg total) by mouth daily as needed. (Patient taking differently: Take 145 mcg by mouth daily as needed (constipation). ) 30 capsule 2  . Multiple Vitamin (MULTIVITAMIN WITH MINERALS) TABS tablet Take 1 tablet by mouth daily.    . nitroGLYCERIN (NITRO-BID) 2 % ointment Apply 0.75 inches topically daily as needed for chest pain.     . nitroGLYCERIN (NITRODUR - DOSED IN MG/24 HR) 0.2 mg/hr patch Place 1 patch (0.2 mg total) onto the skin daily. 30 patch 12  . nitroGLYCERIN (NITROSTAT) 0.4 MG SL tablet Place 1 tablet (0.4 mg total) under the tongue every 5 (five) minutes as needed (as needed for esophageal spasm). (Patient taking differently: Place 0.4 mg under the tongue every 5 (five) minutes as needed for chest pain. ) 30 tablet 1  . omega-3 acid ethyl esters (LOVAZA) 1 G capsule Take 2 g by mouth 2 (two) times daily.    . ondansetron (ZOFRAN-ODT) 4 MG disintegrating tablet Take 4 mg by mouth 4 (four) times daily as needed for nausea.    Marland Kitchen oxyCODONE-acetaminophen (PERCOCET/ROXICET) 5-325 MG tablet Take 1 tablet by mouth every 8 (eight) hours as needed. (Patient taking differently: Take 1 tablet by mouth 3 (three) times daily. ) 15 tablet 0  . pantoprazole (PROTONIX) 40 MG tablet Take 1 tablet (40 mg total) by mouth daily before breakfast. 30 tablet 3  . pregabalin (LYRICA) 100 MG capsule Take 1 capsule (100 mg total) by mouth 3 (three) times daily. 30 capsule 0  . Probiotic CAPS Take 2 capsules by mouth daily after breakfast.    . Propylene Glycol (SYSTANE BALANCE OP) Place 2-3 drops into both eyes daily as needed (for dry eyes).    Marland Kitchen rOPINIRole (REQUIP) 2 MG tablet Take 2 mg by mouth at bedtime.     . Tamsulosin HCl (FLOMAX) 0.4 MG CAPS Take 0.4 mg by mouth daily.    Marland Kitchen torsemide (DEMADEX) 20 MG tablet Take 20 mg by mouth every morning. MAY TAKE AN EXTRA 20 MG ONCE A DAY FOR EXCESSIVE FLUID RETENTION     No current facility-administered medications for this visit.     Past Medical  History:  Diagnosis Date  . Barrett's esophagus   . Carotid artery occlusion 11/10/10   LEFT CAROTID ENDARTERECTOMY  . CHF (congestive heart failure) ([place])   . Complication of anesthesia    BP WENT UP AT [facility] "  . COPD (chronic obstructive pulmonary disease) ([place])   . Coronary artery disease   . Diabetes mellitus   . Diverticulosis of colon (without mention of hemorrhage)   . DJD (degenerative joint disease)   . Emphysema of lung ([place])   . Fatty liver   . Full dentures   . GERD (gastroesophageal reflux disease)   .  H/O Doppler ultrasound 06/28/2012   Lower venous duplex left-- findings consistent with acute deep vein thrombosis involving the personal vein of the left lower extremity. No evidence of Baker's cyst on the left. Duplex scan of the left lower extremity arteries reveal adequate blood flow.   . H/O hiatal hernia   . History of cardiovascular stress test 03/23/2011   ejection fraction measured 51%, with an end-diastolic volume of 937 ml and an end-systolic volume of 81 ml. no evidence of myocardial ischemia.  Marland Kitchen History of echocardiogram 08/22/2011   Mildly hypertrophic left ventricle with normal systolic function and mild diastolic dysfunction. mildly dilated left atrium. no significant valvular abnormalities. no pericardial effusion no change from previous study 10/2010. the ejection fraction =>55%  . History of Holter monitoring 02/14/2012   Scan quality is fair due to baseline artifact. Predominant rhythm is Sinus. Veentricular Ectopy was noted in single beat form. The total recording time was 24:00 hours. total beats =[id number]. the total time analyzed was 23:58 hours. Heart  Max = 102 at 1:18:22, Min= 52 at 9:56:19 am., Avg= 70. There were 0 pause(s) > 2.5 seconds. the longest pause = 0.00 sec. at 1:51 pm(1)  . Hyperlipidemia   . Hypertension   . Neuromuscular disorder ([place])    peripheral neuropathy  . OSA (obstructive sleep apnea) 12/26/08   AHI of 6.3/hr overall, 32.7/hr during  REM sleep and an RDI of 24.8/hr and 50.9/hr.  . Osteomyelitis ([place])   . Osteomyelitis ([place])    left 5th metatarsal  . PAD (peripheral artery disease) ([place])    Distal aortogram June 2012. Atherectomy left popliteal artery July 2012.   . Sleep apnea    has not used CPAP in e years - took self off  . Slurred speech    AS PER WIFE IN D/C NOTE 11/10/10  . Substernal chest pain   . Visual color changes    LEFT EYE; BUT NOT TOTAL BLINDNESS  . Wears glasses     Social History Social History  Substance Use Topics  . Smoking status: Current Every Day Smoker    Packs/day: 0.50    Years: 35.00    Types: E-cigarettes    Last attempt to quit: 10/28/2011  . Smokeless tobacco: Never Used     Comment: VAPOR CIGARETTES (Water only)  . Alcohol use No     Comment: "not in a long time"    Family History Family History  Problem Relation Age of Onset  . Heart disease Father        Before age 77-  CAD, BPG  . Diabetes Father        Amputation  . Cancer Father        PROSTATE  . Hyperlipidemia Father   . Hypertension Father   . Heart attack Father        Triple BPG  . Varicose Veins Father   . Colon cancer Brother   . Cancer Brother        Colon  . Diabetes Brother   . Heart disease Brother 2       A-Fib. Before age 17  . Hyperlipidemia Brother   . Hypertension Brother   . Cancer Sister        Breast  . Hyperlipidemia Sister   . Hypertension Sister   . Hypertension Son   . Arthritis Unknown        GRANDMOTHER  . Hypertension Unknown        OTHER FAMILY MEMBERS  Surgical History Past Surgical History:  Procedure Laterality Date  . AMPUTATION  11/05/2011   Procedure: AMPUTATION RAY;  Surgeon: Wylene Simmer, MD;  Location: [place];  Service: Orthopedics;  Laterality: Right;  Amputation of Right 4&5th Toes  . AMPUTATION Left 11/26/2012   Procedure: AMPUTATION RAY;  Surgeon: Wylene Simmer, MD;  Location: [place];  Service: Orthopedics;  Laterality: Left;  fourth ray amputation  .  AMPUTATION Right 08/27/2014   Procedure: Transmetatarsal Amputation;  Surgeon: Newt Minion, MD;  Location: [place];  Service: Orthopedics;  Laterality: Right;  . AMPUTATION Right 01/14/2015   Procedure: AMPUTATION BELOW KNEE;  Surgeon: Newt Minion, MD;  Location: [place];  Service: Orthopedics;  Laterality: Right;  . AMPUTATION Left 10/21/2015   Procedure: Left Foot 5th Ray Amputation;  Surgeon: Newt Minion, MD;  Location: [place];  Service: Orthopedics;  Laterality: Left;  . ANTERIOR FUSION CERVICAL SPINE  02/06/06   C4-5, C5-6, C6-7; SURGEON DR. MAX COHEN  . CARDIAC CATHETERIZATION  10/31/04   2009  . CAROTID ENDARTERECTOMY  11/10/10  . CAROTID ENDARTERECTOMY Left 11/10/2010   Subtotal occlusion of left internal carotid artery with left hemispheric transient ischemic attacks.  . CARPAL TUNNEL RELEASE Right 10/21/2013   Procedure: RIGHT CARPAL TUNNEL RELEASE;  Surgeon: Wynonia Sours, MD;  Location: [place];  Service: Orthopedics;  Laterality: Right;  . CHOLECYSTECTOMY    . COLONOSCOPY    . ESOPHAGEAL MANOMETRY Bilateral 07/19/2014   Procedure: ESOPHAGEAL MANOMETRY (EM);  Surgeon: Jerene Bears, MD;  Location: [facility];  Service: Gastroenterology;  Laterality: Bilateral;  . FEMORAL ARTERY STENT     x6  . FOOT SURGERY  04/25/2016    EXCISION BASE 5TH METATARSAL AND PARTIAL CUBOID LEFT FOOT  . HERNIA REPAIR     LEFT INGUINAL AND UMBILICAL REPAIRS  . I&D EXTREMITY Left 04/25/2016   Procedure: EXCISION BASE 5TH METATARSAL AND PARTIAL CUBOID LEFT FOOT;  Surgeon: Newt Minion, MD;  Location: [place];  Service: Orthopedics;  Laterality: Left;  . JOINT REPLACEMENT Right 2001   Total  . LAMINECTOMY     X 3 LUMBAR AND X 2 CERVICAL SPINE OPERATIONS  . LAPAROSCOPIC CHOLECYSTECTOMY W/ CHOLANGIOGRAPHY  11/09/04   SURGEON DR. Luella Cook  . LEFT HEART CATHETERIZATION WITH CORONARY ANGIOGRAM N/A 10/29/2014   Procedure: LEFT HEART CATHETERIZATION WITH CORONARY ANGIOGRAM;  Surgeon: Laverda Page, MD;  Location[facility] C[facility] LAB;  Service: Cardiovascular;  Laterality: N/A;  . LOWER EXTREMITY ANGIOGRAM N/A 03/19/2012   Procedure: LOWER EXTREMITY ANGIOGRAM;  Surgeon: Burnell Blanks, MD;  Location[facility] CATH LAB;  Service: Cardiovascular;  Laterality: N/A;  . NECK SURGERY    . PENILE PROSTHESIS IMPLANT  08/14/05   INFRAPUBIC INSERTION OF INFLATABLE PENILE PROSTHESIS; SURGEON DR. Amalia Hailey  . PERCUTANEOUS CORONARY STENT INTERVENTION (PCI-S) Right 10/29/2014   Procedure: PERCUTANEOUS CORONARY STENT INTERVENTION (PCI-S);  Surgeon: Laverda Page, MD;  Location: [facility] CATH LAB;  Service: Cardiovascular;  Laterality: Right;  . SHOULDER ARTHROSCOPY    . SPINE SURGERY    . TONSILLECTOMY    . TOTAL KNEE ARTHROPLASTY  07/2002   RIGHT KNEE ; SURGEON  DR. [name] ALSO HAD ARTHROSCOPIC RIGHT KNEE IN  10/2001  . ULNAR NERVE TRANSPOSITION Right 10/21/2013   Procedure: RIGHT ELBOW  ULNAR NERVE DECOMPRESSION;  Surgeon: Wynonia Sours, MD;  Location: [place];  Service: Orthopedics;  Laterality: Right;    Allergies  Allergen Reactions  . Ivp  Dye [Iodinated Diagnostic Agents] Anaphylaxis and Other (See Comments)    Breathing problems   . Adhesive [Tape] Rash    Rash after 2-3 days use    Current Outpatient Prescriptions  Medication Sig Dispense Refill  . ALPRAZolam (XANAX) 0.25 MG tablet Take 0.25 mg by mouth daily.  0  . amLODipine-olmesartan (AZOR) 5-20 MG tablet Take 2 tablets by mouth every evening. 180 tablet 1  . aspirin EC 81 MG tablet Take 81 mg by mouth daily.    Marland Kitchen atorvastatin (LIPITOR) 40 MG tablet Take 40 mg by mouth daily.    . carvedilol (COREG) 25 MG tablet Take 1.5 tablets (37.5 mg total) by mouth 2 (two) times daily. 270 tablet 2  . clobetasol (TEMOVATE) 0.05 % external solution Apply 1 application topically daily as needed (dry scalp).    . clopidogrel (PLAVIX) 75 MG tablet Take 75 mg by mouth daily.     . clotrimazole-betamethasone (LOTRISONE) cream Apply 1  application topically daily as needed (dry skin - beard).     . collagenase (SANTYL) ointment Apply topically daily. (Patient taking differently: Apply 1 application topically daily. TO AFFECTED SITE(S)) 15 g 0  . fentaNYL (DURAGESIC - DOSED MCG/HR) 50 MCG/HR Place 1 patch (50 mcg total) onto the skin every 3 (three) days. 3 patch 0  . fluticasone (CUTIVATE) 0.05 % cream Apply 1 application topically 2 (two) times daily as needed (dry skin on face).     Marland Kitchen glyBURIDE-metformin (GLUCOVANCE) 2.5-500 MG per tablet Take 2 tablets by mouth 2 (two) times daily with a meal.     . HYDROmorphone (DILAUDID) 2 MG tablet Take 1 tablet (2 mg total) by mouth at bedtime. (Patient taking differently: Take 2 mg by mouth at bedtime as needed (for breakthrough pain). ) 10 tablet 0  . insulin glargine (LANTUS) 100 UNIT/ML injection Inject 0.6 mLs (60 Units total) into the skin at bedtime. 10 mL 0  . Insulin Glulisine (APIDRA) 100 UNIT/ML Solostar Pen Inject 13 Units into the skin 3 (three) times daily with meals. (Patient taking differently: Inject 10 Units into the skin 3 (three) times daily as needed (for CBG). ) 15 mL 0  . isosorbide mononitrate (IMDUR) 120 MG 24 hr tablet Take 120 mg by mouth daily.    . Linaclotide (LINZESS) 145 MCG CAPS capsule Take 1 capsule (145 mcg total) by mouth daily as needed. (Patient taking differently: Take 145 mcg by mouth daily as needed (constipation). ) 30 capsule 2  . Multiple Vitamin (MULTIVITAMIN WITH MINERALS) TABS tablet Take 1 tablet by mouth daily.    . nitroGLYCERIN (NITRO-BID) 2 % ointment Apply 0.75 inches topically daily as needed for chest pain.     . nitroGLYCERIN (NITRODUR - DOSED IN MG/24 HR) 0.2 mg/hr patch Place 1 patch (0.2 mg total) onto the skin daily. 30 patch 12  . nitroGLYCERIN (NITROSTAT) 0.4 MG SL tablet Place 1 tablet (0.4 mg total) under the tongue every 5 (five) minutes as needed (as needed for esophageal spasm). (Patient taking differently: Place 0.4 mg under  the tongue every 5 (five) minutes as needed for chest pain. ) 30 tablet 1  . omega-3 acid ethyl esters (LOVAZA) 1 G capsule Take 2 g by mouth 2 (two) times daily.    . ondansetron (ZOFRAN-ODT) 4 MG disintegrating tablet Take 4 mg by mouth 4 (four) times daily as needed for nausea.    Marland Kitchen oxyCODONE-acetaminophen (PERCOCET/ROXICET) 5-325 MG tablet Take 1 tablet by mouth every 8 (eight) hours as needed. (  Patient taking differently: Take 1 tablet by mouth 3 (three) times daily. ) 15 tablet 0  . pantoprazole (PROTONIX) 40 MG tablet Take 1 tablet (40 mg total) by mouth daily before breakfast. 30 tablet 3  . pregabalin (LYRICA) 100 MG capsule Take 1 capsule (100 mg total) by mouth 3 (three) times daily. 30 capsule 0  . Probiotic CAPS Take 2 capsules by mouth daily after breakfast.    . Propylene Glycol (SYSTANE BALANCE OP) Place 2-3 drops into both eyes daily as needed (for dry eyes).    Marland Kitchen rOPINIRole (REQUIP) 2 MG tablet Take 2 mg by mouth at bedtime.     . Tamsulosin HCl (FLOMAX) 0.4 MG CAPS Take 0.4 mg by mouth daily.    Marland Kitchen torsemide (DEMADEX) 20 MG tablet Take 20 mg by mouth every morning. MAY TAKE AN EXTRA 20 MG ONCE A DAY FOR EXCESSIVE FLUID RETENTION     No current facility-administered medications for this visit.      REVIEW OF SYSTEMS: See HPI for pertinent positives and negatives.  Physical Examination Vitals:   03/01/17 1510 03/01/17 1518  BP: 125/63 123/67  Pulse: 66   Resp: 20   Temp: 98.9 F (37.2 C)   TempSrc: Oral   SpO2: 98%   Weight: 250 lb (113.4 kg)   Height: 6\' 2"  (1.88 m)    Body mass index is 32.1 kg/m.  General: WDWN obese male in NAD Gait: Using right BKA prosthesis, left ankle brace, and crutches.  HENT: WNL Eyes: Pupils equal Pulmonary: normal non-labored breathing, good air movement in all fields, CTAB Cardiac: RRR, no murmur detected  Abdomen: soft, NT, no palpable masses Skin: see Extremities.   VASCULAR EXAM  Carotid Bruits Left Right    positive Negative   radial pulses: 2+ palpable and =   VASCULAR EXAM: Extremitieswithout ischemic changes  without Gangrene; surgically absent left 4th and 5th toes s/p Ray amputation,Right BKA prosthesis with pt, macular irritated appearing red rash on right BKA stump.  Left foot and ankle with 2+ pitting edema. Pt has his knee high compression hose with him.      LE Pulses LEFT RIGHT   FEMORAL  palpable faintly palpable    POPLITEAL not palpable  not palpable   POSTERIOR TIBIAL 2+ palpable  BKA    DORSALIS PEDIS   2+palpable  BKA     Musculoskeletal: no muscle wasting or atrophy; see Extremities.  Neurologic:A&O X 3; Appropriate Affect, MOTOR FUNCTION: 5/5 Symmetric, CN 2-12 intact, speech is fluent/normal.           ASSESSMENT:  [name] is a 61 y.o. male who is s/p left CEA on 11/10/2010. A vascular surgeon in [place] that placed some arterial stents in his legs was following pt's PAD; pt requested that our practice monitor his PAD. Pt has no history of stroke or TIA.  He is s/p right BKA in February 2016 by [name]. He had a left 5th ray amputation in February 2017 by [name] for an infected ulcer, per pt. The ulcer on the lateral aspect of pt's left foot has healed. He wears his left knee high compression hose. He has 6 stents in his LE's.  ESI's did not help his back issues. He also has DM peripheral neuropathy. He stopped tobacco use in 2013. His DM remains in good control, but pt states it has not always been in good control.  His right BKA prosthesis has not fit correctly since the second refit,  he recently had a 3rd refit, and he has a red irritated appearing rash under his stump shrinker, no open wounds. His left ankle brace is too  wide for most of his pants to fit over, and the heel of the shoe attached to it is wearing down quickly. Pt had been working with [employer].  Pt was given prescription to [employer] for better fitting right BKA prosthesis and stump shrinker that does not cause skin breakdown.  Also gave a prescription for [employer] for a slimmer left ankle stabilizer in order for him to wear his pants; the one he has is very wide, most of his pants will not fit over it.    DATA  Carotid Duplex (03/01/17): Right ICA: <40% stenosis. >50% stenosis of right ECA Left ICA: CEA site with no restenosis, with mild hyperplasia at the distal patch site. Bilateral vertebral artery flow is antegrade.  Bilateral subclavian artery waveforms are normal.  No significant change compared to the last exam on 03-23-16.    ABI (Date: 03/01/2017):  R: BKA   L:   ABI: 1.07 (was 0.96 on 07-25-16),   PT: tri  DP: tri  TBI: 0.73 (was 0.71)  Left ABI and TBI remain normal with triphasic waveforms.    PLAN:  Based on the patient's vascular studies and examination, pt will return to clinic in 18 months with carotid duplex and ABI's.  I advised him to notify us if he develops concerns re the circulation in his feet or legs.   I discussed in depth with the patient the nature of atherosclerosis, and emphasized the importance of maximal medical management including strict control of blood pressure, blood glucose, and lipid levels, obtaining regular exercise, and cessation of smoking.  The patient is aware that without maximal medical management the underlying atherosclerotic disease process will progress, limiting the benefit of any interventions.  The patient was given information about stroke prevention and what symptoms should prompt the patient to seek immediate medical care.  The patient was given information about PAD including signs, symptoms, treatment, what symptoms should prompt the patient to seek immediate medical  care, and risk reduction measures to take.  Thank you for allowing [place] to participate in this patient's care.  Clemon Chambers, RN, MSN, FNP-C [facility] Office: [phone]  Clinic MD: Donzetta Matters on call 03/01/2017 3:22 PM

## 2017-03-05 ENCOUNTER — Ambulatory Visit (INDEPENDENT_AMBULATORY_CARE_PROVIDER_SITE_OTHER): Payer: BLUE CROSS/BLUE SHIELD | Admitting: Orthopedic Surgery

## 2017-03-05 DIAGNOSIS — L97521 Non-pressure chronic ulcer of other part of left foot limited to breakdown of skin: Secondary | ICD-10-CM

## 2017-03-05 DIAGNOSIS — Z89511 Acquired absence of right leg below knee: Secondary | ICD-10-CM

## 2017-03-05 DIAGNOSIS — I779 Disorder of arteries and arterioles, unspecified: Secondary | ICD-10-CM

## 2017-03-05 NOTE — Addendum Note (Signed)
Addended by: Lianne Cure A on: 03/05/2017 04:25 PM   Modules accepted: Orders

## 2017-03-05 NOTE — Progress Notes (Signed)
Office Visit Note   Patient: [name]           Date of Birth: 1956-04-21           MRN: [id number] Visit Date: 03/05/2017              Requested by: Anda Kraft, MD [address] [place], [place] [address] PCP: Anda Kraft, MD  No chief complaint on file.     HPI: Patient presents for follow-up for both lower extremities. Patient is still having difficulty with prosthetic fitting on the right he is using over 15 apply sock and has been having some end bearing problems as well as problems with pressure in the popliteal fossa. Patient also is undergoing excellent healing of the ulcers of the left foot status post 2 lateral ray amputations he is wearing extra depth shoes has worn-out orthotics and a double upright brace. Patient states he recently has had vascular studies performed which showed persistent blockages.  Assessment & Plan: Visit Diagnoses:  1. Non-pressure chronic ulcer of other part of left foot limited to breakdown of skin ([place])   2. History of right below knee amputation ([place])     Plan: Patient was given a prescription for new orthotics for his left foot status post fourth and fifth ray amputations is given a prescription for a new liner socks and sleeves for the right transtibial amputation. Patient is currently working with [employer].  Follow-Up Instructions: Return in about 3 months (around 06/05/2017).   Ortho Exam  Patient is alert, oriented, no adenopathy, well-dressed, normal affect, normal respiratory effort. Examination patient has an antalgic gait the left foot the plantar ulcers have completely healed the MTP ulcer is healed and the ankle ulcer is healed. He does have venous stasis swelling and is wearing the medical compression stocking and this is most likely due to his increasing activities today. There are no open venous ulcers. Examination the right transtibial amputation is stable with no open ulcers. He has pre-ulcerative  callus.  Imaging: No results found.  Labs: Lab Results  Component Value Date   HGBA1C 8.6 (H) 05/04/2016   HGBA1C 9.2 (H) 04/25/2016   HGBA1C 6.8 (H) 12/17/2015   ESRSEDRATE 35 (H) 09/03/2016   ESRSEDRATE 111 (H) 05/11/2016   ESRSEDRATE 98 (H) 05/04/2016   CRP 1.2 (H) 09/03/2016   CRP 4.7 (H) 05/11/2016   CRP 8.0 (H) 05/04/2016   REPTSTATUS 05/09/2016 FINAL 05/04/2016   GRAMSTAIN  09/26/2014    NO WBC SEEN NO SQUAMOUS EPITHELIAL CELLS SEEN NO ORGANISMS SEEN Performed at [facility]    CULT NO GROWTH 5 DAYS 05/04/2016   LABORGA STAPHYLOCOCCUS AUREUS 09/26/2014    Orders:  No orders of the defined types were placed in this encounter.  No orders of the defined types were placed in this encounter.    Procedures: No procedures performed  Clinical Data: No additional findings.  ROS:  All other systems negative, except as noted in the HPI. Review of Systems  Objective: Vital Signs: There were no vitals taken for this visit.  Specialty Comments:  No specialty comments available.  PMFS History: Patient Active Problem List   Diagnosis Date Noted  . Chronic venous hypertension (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 ([place]) 12/11/2016  . Non-pressure chronic ulcer of other part of left foot limited to breakdown of skin ([place]) 11/12/2016  . Unilateral primary osteoarthritis, left knee 10/11/2016  . Midfoot ulcer, left, limited to breakdown of  skin ([place]) 10/11/2016  . Hypertension 07/30/2016  . History of right below knee amputation ([place]) 07/12/2016  . Poorly controlled type 2 diabetes mellitus with peripheral neuropathy ([place]) 05/09/2016  . Pressure ulcer 05/08/2016  . Diabetic foot infection ([place]) 05/04/2016  . Diabetic foot ([place]) 05/04/2016  . Type 2 diabetes mellitus with insulin therapy ([place]) 12/16/2015  . Amputated toe ([place]) 10/21/2015  . Diabetic foot ulcer ([place]) 10/12/2015  . Leg edema, left 09/03/2015  . Acute renal failure  ([place]) 09/03/2015  . CAD (dz of distal, mid and proximal RCA with implantation of 3 overlapping drug-eluting stent,) 09/03/2015  . Chronic diastolic heart failure, NYHA class 2 ([place]) 09/03/2015  . Opacity of lung on imaging study 01/14/2015  . Angina pectoris associated with type 2 diabetes mellitus ([place]) 10/28/2014  . Hyperlipidemia 08/25/2014  . Chronic total occlusion of artery of the extremities ([place]) 04/08/2012  . Onychomycosis 02/01/2012  . Diabetes mellitus, insulin dependent (IDDM), controlled ([place]) 11/01/2011  . Occlusion and stenosis of carotid artery without mention of cerebral infarction 06/12/2011  . Aftercare following surgery of the circulatory system, [place] 06/12/2011  . GERD (gastroesophageal reflux disease) 05/08/2011  . Barrett's esophagus without dysplasia 05/08/2011  . PVD (peripheral vascular disease) ([place]) 02/01/2011  . HTN (hypertension) 02/01/2011  . Former tobacco use 02/01/2011   Past Medical History:  Diagnosis Date  . Barrett's esophagus   . Carotid artery occlusion 11/10/10   LEFT CAROTID ENDARTERECTOMY  . CHF (congestive heart failure) ([place])   . Complication of anesthesia    BP WENT UP AT [facility] "  . COPD (chronic obstructive pulmonary disease) ([place])   . Coronary artery disease   . Diabetes mellitus   . Diverticulosis of colon (without mention of hemorrhage)   . DJD (degenerative joint disease)   . Emphysema of lung ([place])   . Fatty liver   . Full dentures   . GERD (gastroesophageal reflux disease)   . H/O Doppler ultrasound 06/28/2012   Lower venous duplex left-- findings consistent with acute deep vein thrombosis involving the personal vein of the left lower extremity. No evidence of Baker's cyst on the left. Duplex scan of the left lower extremity arteries reveal adequate blood flow.   . H/O hiatal hernia   . History of cardiovascular stress test 03/23/2011   ejection fraction measured 51%, with an end-diastolic volume of 782 ml and an end-systolic  volume of 81 ml. no evidence of myocardial ischemia.  Marland Kitchen History of echocardiogram 08/22/2011   Mildly hypertrophic left ventricle with normal systolic function and mild diastolic dysfunction. mildly dilated left atrium. no significant valvular abnormalities. no pericardial effusion no change from previous study 10/2010. the ejection fraction =>55%  . History of Holter monitoring 02/14/2012   Scan quality is fair due to baseline artifact. Predominant rhythm is Sinus. Veentricular Ectopy was noted in single beat form. The total recording time was 24:00 hours. total beats =104061. the total time analyzed was 23:58 hours. Heart  Max = 102 at 1:18:22, Min= 52 at 9:56:19 am., Avg= 70. There were 0 pause(s) > 2.5 seconds. the longest pause = 0.00 sec. at 1:51 pm(1)  . Hyperlipidemia   . Hypertension   . Neuromuscular disorder ([place])    peripheral neuropathy  . OSA (obstructive sleep apnea) 12/26/08   AHI of 6.3/hr overall, 32.7/hr during REM sleep and an RDI of 24.8/hr and 50.9/hr.  . Osteomyelitis ([place])   . Osteomyelitis ([place])    left 5th metatarsal  . PAD (peripheral artery disease) (  [place])    Distal aortogram June 2012. Atherectomy left popliteal artery July 2012.   . Sleep apnea    has not used CPAP in e years - took self off  . Slurred speech    AS PER WIFE IN D/C NOTE 11/10/10  . Substernal chest pain   . Visual color changes    LEFT EYE; BUT NOT TOTAL BLINDNESS  . Wears glasses     Family History  Problem Relation Age of Onset  . Heart disease Father        Before age 15-  CAD, BPG  . Diabetes Father        Amputation  . Cancer Father        PROSTATE  . Hyperlipidemia Father   . Hypertension Father   . Heart attack Father        Triple BPG  . Varicose Veins Father   . Colon cancer Brother   . Cancer Brother        Colon  . Diabetes Brother   . Heart disease Brother 50       A-Fib. Before age 82  . Hyperlipidemia Brother   . Hypertension Brother   . Cancer Sister        Breast   . Hyperlipidemia Sister   . Hypertension Sister   . Hypertension Son   . Arthritis Unknown        GRANDMOTHER  . Hypertension Unknown        OTHER FAMILY MEMBERS    Past Surgical History:  Procedure Laterality Date  . AMPUTATION  11/05/2011   Procedure: AMPUTATION RAY;  Surgeon: Wylene Simmer, MD;  Location: [place];  Service: Orthopedics;  Laterality: Right;  Amputation of Right 4&5th Toes  . AMPUTATION Left 11/26/2012   Procedure: AMPUTATION RAY;  Surgeon: Wylene Simmer, MD;  Location: [place];  Service: Orthopedics;  Laterality: Left;  fourth ray amputation  . AMPUTATION Right 08/27/2014   Procedure: Transmetatarsal Amputation;  Surgeon: Newt Minion, MD;  Location: [place];  Service: Orthopedics;  Laterality: Right;  . AMPUTATION Right 01/14/2015   Procedure: AMPUTATION BELOW KNEE;  Surgeon: Newt Minion, MD;  Location: [place];  Service: Orthopedics;  Laterality: Right;  . AMPUTATION Left 10/21/2015   Procedure: Left Foot 5th Ray Amputation;  Surgeon: Newt Minion, MD;  Location: [place];  Service: Orthopedics;  Laterality: Left;  . ANTERIOR FUSION CERVICAL SPINE  02/06/06   C4-5, C5-6, C6-7; SURGEON DR. MAX COHEN  . CARDIAC CATHETERIZATION  10/31/04   2009  . CAROTID ENDARTERECTOMY  11/10/10  . CAROTID ENDARTERECTOMY Left 11/10/2010   Subtotal occlusion of left internal carotid artery with left hemispheric transient ischemic attacks.  . CARPAL TUNNEL RELEASE Right 10/21/2013   Procedure: RIGHT CARPAL TUNNEL RELEASE;  Surgeon: Wynonia Sours, MD;  Location: [place];  Service: Orthopedics;  Laterality: Right;  . CHOLECYSTECTOMY    . COLONOSCOPY    . ESOPHAGEAL MANOMETRY Bilateral 07/19/2014   Procedure: ESOPHAGEAL MANOMETRY (EM);  Surgeon: Jerene Bears, MD;  [facility];  Service: Gastroenterology;  Laterality: Bilateral;  . FEMORAL ARTERY STENT     x6  . FOOT SURGERY  04/25/2016    EXCISION BASE 5TH METATARSAL AND PARTIAL CUBOID LEFT FOOT  . HERNIA REPAIR     LEFT  INGUINAL AND UMBILICAL REPAIRS  . I&D EXTREMITY Left 04/25/2016   Procedure: EXCISION BASE 5TH METATARSAL AND PARTIAL CUBOID LEFT FOOT;  Surgeon: Newt Minion, MD;  Location: [place];  Service: Orthopedics;  Laterality: Left;  . JOINT REPLACEMENT Right 2001   Total  . LAMINECTOMY     X 3 LUMBAR AND X 2 CERVICAL SPINE OPERATIONS  . LAPAROSCOPIC CHOLECYSTECTOMY W/ CHOLANGIOGRAPHY  11/09/04   SURGEON DR. Luella Cook  . LEFT HEART CATHETERIZATION WITH CORONARY ANGIOGRAM N/A 10/29/2014   Procedure: LEFT HEART CATHETERIZATION WITH CORONARY ANGIOGRAM;  Surgeon: Laverda Page, MD;  Location: [facility] C[facility] LAB;  Service: Cardiovascular;  Laterality: N/A;  . LOWER EXTREMITY ANGIOGRAM N/A 03/19/2012   Procedure: LOWER EXTREMITY ANGIOGRAM;  Surgeon: Burnell Blanks, MD;  Location: [facility] CATH LAB;  Service: Cardiovascular;  Laterality: N/A;  . NECK SURGERY    . PENILE PROSTHESIS IMPLANT  08/14/05   INFRAPUBIC INSERTION OF INFLATABLE PENILE PROSTHESIS; SURGEON DR. Amalia Hailey  . PERCUTANEOUS CORONARY STENT INTERVENTION (PCI-S) Right 10/29/2014   Procedure: PERCUTANEOUS CORONARY STENT INTERVENTION (PCI-S);  Surgeon: Laverda Page, MD;  Location: [facility] C[facility] LAB;  Service: Cardiovascular;  Laterality: Right;  . SHOULDER ARTHROSCOPY    . SPINE SURGERY    . TONSILLECTOMY    . TOTAL KNEE ARTHROPLASTY  07/2002   RIGHT KNEE ; SURGEON  DR. [name] ALSO HAD ARTHROSCOPIC RIGHT KNEE IN  10/2001  . ULNAR NERVE TRANSPOSITION Right 10/21/2013   Procedure: RIGHT ELBOW  ULNAR NERVE DECOMPRESSION;  Surgeon: Wynonia Sours, MD;  Location: [place];  Service: Orthopedics;  Laterality: Right;   Social History   Occupational History  . TRUCK DRIVER Unemployed   Social History Main Topics  . Smoking status: Current Every Day Smoker    Packs/day: 0.50    Years: 35.00    Types: E-cigarettes    Last attempt to quit: 10/28/2011  . Smokeless tobacco: Never Used     Comment: VAPOR CIGARETTES (Water only)  . Alcohol  use No     Comment: "not in a long time"  . Drug use: No  . Sexual activity: Yes    Birth control/ protection: Implant

## 2017-03-20 ENCOUNTER — Encounter (HOSPITAL_COMMUNITY): Payer: Medicare Other

## 2017-03-20 ENCOUNTER — Ambulatory Visit: Payer: Medicare Other | Admitting: Family

## 2017-06-03 ENCOUNTER — Encounter (INDEPENDENT_AMBULATORY_CARE_PROVIDER_SITE_OTHER): Payer: Self-pay | Admitting: Orthopedic Surgery

## 2017-06-03 ENCOUNTER — Ambulatory Visit (INDEPENDENT_AMBULATORY_CARE_PROVIDER_SITE_OTHER): Payer: BLUE CROSS/BLUE SHIELD | Admitting: Orthopedic Surgery

## 2017-06-03 DIAGNOSIS — L97521 Non-pressure chronic ulcer of other part of left foot limited to breakdown of skin: Secondary | ICD-10-CM | POA: Diagnosis not present

## 2017-06-03 DIAGNOSIS — I87332 Chronic venous hypertension (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: Secondary | ICD-10-CM

## 2017-06-03 DIAGNOSIS — L97929 Non-pressure chronic ulcer of unspecified part of left lower leg with unspecified severity: Secondary | ICD-10-CM

## 2017-06-03 DIAGNOSIS — Z89511 Acquired absence of right leg below knee: Secondary | ICD-10-CM | POA: Diagnosis not present

## 2017-06-03 DIAGNOSIS — I779 Disorder of arteries and arterioles, unspecified: Secondary | ICD-10-CM

## 2017-06-03 NOTE — Progress Notes (Signed)
Office Visit Note   Patient: [name]           Date of Birth: [birth year]           MRN: [id number] Visit Date: 06/03/2017              Requested by: Anda Kraft, MD [address] [place], [place] [address] PCP: Anda Kraft, MD  No chief complaint on file.     HPI: Patient is a 61 year old gentleman status post right transtibial amputation that is post left fourth and fifth ray amputation with venous stasis insufficiency left lower extremity currently wearing compression stockings on the left with double upright brace of the left patient states that the braces are coming off the shoe she is working with [employer].  Assessment & Plan: Visit Diagnoses:  1. Non-pressure chronic ulcer of other part of left foot limited to breakdown of skin ([place])   2. History of right below knee amputation ([place])   3. Chronic venous hypertension (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 (HCC)     Plan: Recommended follow up with [facility] to adjust the braces. Recommended discontinuing put Silvadene between his toes his skin is macerated and starting to breakdown. We will reevaluate in 4 weeks to ensure the skin is healing up properly.  Follow-Up Instructions: Return in about 4 weeks (around 07/01/2017).   Ortho Exam  Patient is alert, oriented, no adenopathy, well-dressed, normal affect, normal respiratory effort. Examination patient has Silvadene take between his toes on the left foot. This was removed there is some maceration breakdown of the skin with plantar aspect of the foot and between the toes. This is not full-thickness there is no exposed bone or tendon there is no ascending cellulitis is foot is plantigrade no plantar ulcers. Nails were trimmed 2 without complication.  Imaging: No results found. No images are attached to the encounter.  Labs: Lab Results  Component Value Date   HGBA1C 8.6 (H) 05/04/2016   HGBA1C 9.2 (H) 04/25/2016   HGBA1C 6.8  (H) 12/17/2015   ESRSEDRATE 35 (H) 09/03/2016   ESRSEDRATE 111 (H) 05/11/2016   ESRSEDRATE 98 (H) 05/04/2016   CRP 1.2 (H) 09/03/2016   CRP 4.7 (H) 05/11/2016   CRP 8.0 (H) 05/04/2016   REPTSTATUS 05/09/2016 FINAL 05/04/2016   GRAMSTAIN  09/26/2014    NO WBC SEEN NO SQUAMOUS EPITHELIAL CELLS SEEN NO ORGANISMS SEEN Performed at [facility]    CULT NO GROWTH 5 DAYS 05/04/2016   LABORGA STAPHYLOCOCCUS AUREUS 09/26/2014    Orders:  No orders of the defined types were placed in this encounter.  No orders of the defined types were placed in this encounter.    Procedures: No procedures performed  Clinical Data: No additional findings.  ROS:  All other systems negative, except as noted in the HPI. Review of Systems  Objective: Vital Signs: There were no vitals taken for this visit.  Specialty Comments:  No specialty comments available.  PMFS History: Patient Active Problem List   Diagnosis Date Noted  . Chronic venous hypertension (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 ([place]) 12/11/2016  . Non-pressure chronic ulcer of other part of left foot limited to breakdown of skin ([place]) 11/12/2016  . Unilateral primary osteoarthritis, left knee 10/11/2016  . Midfoot ulcer, left, limited to breakdown of skin ([place]) 10/11/2016  . Hypertension 07/30/2016  . History of right below knee amputation ([place]) 07/12/2016  . Poorly controlled type 2 diabetes  mellitus with peripheral neuropathy ([place]) 05/09/2016  . Pressure ulcer 05/08/2016  . Diabetic foot infection ([place]) 05/04/2016  . Diabetic foot ([place]) 05/04/2016  . Type 2 diabetes mellitus with insulin therapy ([place]) 12/16/2015  . Amputated toe ([place]) 10/21/2015  . Diabetic foot ulcer ([place]) 10/12/2015  . Leg edema, left 09/03/2015  . Acute renal failure ([place]) 09/03/2015  . CAD (dz of distal, mid and proximal RCA with implantation of 3 overlapping drug-eluting stent,) 09/03/2015  . Chronic diastolic heart  failure, NYHA class 2 ([place]) 09/03/2015  . Opacity of lung on imaging study 01/14/2015  . Angina pectoris associated with type 2 diabetes mellitus ([place]) 10/28/2014  . Hyperlipidemia 08/25/2014  . Chronic total occlusion of artery of the extremities ([place]) 04/08/2012  . Onychomycosis 02/01/2012  . Diabetes mellitus, insulin dependent (IDDM), controlled ([place]) 11/01/2011  . Occlusion and stenosis of carotid artery without mention of cerebral infarction 06/12/2011  . Aftercare following surgery of the circulatory system, [place] 06/12/2011  . GERD (gastroesophageal reflux disease) 05/08/2011  . Barrett's esophagus without dysplasia 05/08/2011  . PVD (peripheral vascular disease) ([place]) 02/01/2011  . HTN (hypertension) 02/01/2011  . Former tobacco use 02/01/2011   Past Medical History:  Diagnosis Date  . Barrett's esophagus   . Carotid artery occlusion 11/10/10   LEFT CAROTID ENDARTERECTOMY  . CHF (congestive heart failure) ([place])   . Complication of anesthesia    BP WENT UP AT [facility] "  . COPD (chronic obstructive pulmonary disease) ([place])   . Coronary artery disease   . Diabetes mellitus   . Diverticulosis of colon (without mention of hemorrhage)   . DJD (degenerative joint disease)   . Emphysema of lung ([place])   . Fatty liver   . Full dentures   . GERD (gastroesophageal reflux disease)   . H/O Doppler ultrasound 06/28/2012   Lower venous duplex left-- findings consistent with acute deep vein thrombosis involving the personal vein of the left lower extremity. No evidence of Baker's cyst on the left. Duplex scan of the left lower extremity arteries reveal adequate blood flow.   . H/O hiatal hernia   . History of cardiovascular stress test 03/23/2011   ejection fraction measured 51%, with an end-diastolic volume of 767 ml and an end-systolic volume of 81 ml. no evidence of myocardial ischemia.  Marland Kitchen History of echocardiogram 08/22/2011   Mildly hypertrophic left ventricle with normal systolic function  and mild diastolic dysfunction. mildly dilated left atrium. no significant valvular abnormalities. no pericardial effusion no change from previous study 10/2010. the ejection fraction =>55%  . History of Holter monitoring 02/14/2012   Scan quality is fair due to baseline artifact. Predominant rhythm is Sinus. Veentricular Ectopy was noted in single beat form. The total recording time was 24:00 hours. total beats =104061. the total time analyzed was 23:58 hours. Heart  Max = 102 at 1:18:22, Min= 52 at 9:56:19 am., Avg= 70. There were 0 pause(s) > 2.5 seconds. the longest pause = 0.00 sec. at 1:51 pm(1)  . Hyperlipidemia   . Hypertension   . Neuromuscular disorder ([place])    peripheral neuropathy  . OSA (obstructive sleep apnea) 12/26/08   AHI of 6.3/hr overall, 32.7/hr during REM sleep and an RDI of 24.8/hr and 50.9/hr.  . Osteomyelitis ([place])   . Osteomyelitis ([place])    left 5th metatarsal  . PAD (peripheral artery disease) ([place])    Distal aortogram June 2012. Atherectomy left popliteal artery July 2012.   . Sleep apnea    has not  used CPAP in e years - took self off  . Slurred speech    AS PER WIFE IN D/C NOTE 11/10/10  . Substernal chest pain   . Visual color changes    LEFT EYE; BUT NOT TOTAL BLINDNESS  . Wears glasses     Family History  Problem Relation Age of Onset  . Heart disease Father        Before age 12-  CAD, BPG  . Diabetes Father        Amputation  . Cancer Father        PROSTATE  . Hyperlipidemia Father   . Hypertension Father   . Heart attack Father        Triple BPG  . Varicose Veins Father   . Colon cancer Brother   . Cancer Brother        Colon  . Diabetes Brother   . Heart disease Brother 58       A-Fib. Before age 71  . Hyperlipidemia Brother   . Hypertension Brother   . Cancer Sister        Breast  . Hyperlipidemia Sister   . Hypertension Sister   . Hypertension Son   . Arthritis Unknown        GRANDMOTHER  . Hypertension Unknown        OTHER FAMILY  MEMBERS    Past Surgical History:  Procedure Laterality Date  . AMPUTATION  11/05/2011   Procedure: AMPUTATION RAY;  Surgeon: Wylene Simmer, MD;  Location: [place];  Service: Orthopedics;  Laterality: Right;  Amputation of Right 4&5th Toes  . AMPUTATION Left 11/26/2012   Procedure: AMPUTATION RAY;  Surgeon: Wylene Simmer, MD;  Location: [place];  Service: Orthopedics;  Laterality: Left;  fourth ray amputation  . AMPUTATION Right 08/27/2014   Procedure: Transmetatarsal Amputation;  Surgeon: Newt Minion, MD;  Location: [place];  Service: Orthopedics;  Laterality: Right;  . AMPUTATION Right 01/14/2015   Procedure: AMPUTATION BELOW KNEE;  Surgeon: Newt Minion, MD;  Location: [place];  Service: Orthopedics;  Laterality: Right;  . AMPUTATION Left 10/21/2015   Procedure: Left Foot 5th Ray Amputation;  Surgeon: Newt Minion, MD;  Location: [place];  Service: Orthopedics;  Laterality: Left;  . ANTERIOR FUSION CERVICAL SPINE  02/06/06   C4-5, C5-6, C6-7; SURGEON DR. MAX COHEN  . CARDIAC CATHETERIZATION  10/31/04   2009  . CAROTID ENDARTERECTOMY  11/10/10  . CAROTID ENDARTERECTOMY Left 11/10/2010   Subtotal occlusion of left internal carotid artery with left hemispheric transient ischemic attacks.  . CARPAL TUNNEL RELEASE Right 10/21/2013   Procedure: RIGHT CARPAL TUNNEL RELEASE;  Surgeon: Wynonia Sours, MD;  Location: [place];  Service: Orthopedics;  Laterality: Right;  . CHOLECYSTECTOMY    . COLONOSCOPY    . ESOPHAGEAL MANOMETRY Bilateral 07/19/2014   Procedure: ESOPHAGEAL MANOMETRY (EM);  Surgeon: Jerene Bears, MD;  [facility];  Service: Gastroenterology;  Laterality: Bilateral;  . FEMORAL ARTERY STENT     x6  . FOOT SURGERY  04/25/2016    EXCISION BASE 5TH METATARSAL AND PARTIAL CUBOID LEFT FOOT  . HERNIA REPAIR     LEFT INGUINAL AND UMBILICAL REPAIRS  . I&D EXTREMITY Left 04/25/2016   Procedure: EXCISION BASE 5TH METATARSAL AND PARTIAL CUBOID LEFT FOOT;  Surgeon: Newt Minion, MD;   Location: [place];  Service: Orthopedics;  Laterality: Left;  . JOINT REPLACEMENT Right 2001   Total  . LAMINECTOMY  X 3 LUMBAR AND X 2 CERVICAL SPINE OPERATIONS  . LAPAROSCOPIC CHOLECYSTECTOMY W/ CHOLANGIOGRAPHY  11/09/04   SURGEON DR. Luella Cook  . LEFT HEART CATHETERIZATION WITH CORONARY ANGIOGRAM N/A 10/29/2014   Procedure: LEFT HEART CATHETERIZATION WITH CORONARY ANGIOGRAM;  Surgeon: Laverda Page, MD;  Location: [facility] CATH LAB;  Service: Cardiovascular;  Laterality: N/A;  . LOWER EXTREMITY ANGIOGRAM N/A 03/19/2012   Procedure: LOWER EXTREMITY ANGIOGRAM;  Surgeon: Burnell Blanks, MD;  Location: [facility] CATH LAB;  Service: Cardiovascular;  Laterality: N/A;  . NECK SURGERY    . PENILE PROSTHESIS IMPLANT  08/14/05   INFRAPUBIC INSERTION OF INFLATABLE PENILE PROSTHESIS; SURGEON DR. Amalia Hailey  . PERCUTANEOUS CORONARY STENT INTERVENTION (PCI-S) Right 10/29/2014   Procedure: PERCUTANEOUS CORONARY STENT INTERVENTION (PCI-S);  Surgeon: Laverda Page, MD;  Location: [facility] CATH LAB;  Service: Cardiovascular;  Laterality: Right;  . SHOULDER ARTHROSCOPY    . SPINE SURGERY    . TONSILLECTOMY    . TOTAL KNEE ARTHROPLASTY  07/2002   RIGHT KNEE ; SURGEON  DR. [name] ALSO HAD ARTHROSCOPIC RIGHT KNEE IN  10/2001  . ULNAR NERVE TRANSPOSITION Right 10/21/2013   Procedure: RIGHT ELBOW  ULNAR NERVE DECOMPRESSION;  Surgeon: Wynonia Sours, MD;  Location: [place];  Service: Orthopedics;  Laterality: Right;   Social History   Occupational History  . TRUCK DRIVER Unemployed   Social History Main Topics  . Smoking status: Current Every Day Smoker    Packs/day: 0.50    Years: 35.00    Types: E-cigarettes    Last attempt to quit: 10/28/2011  . Smokeless tobacco: Never Used     Comment: VAPOR CIGARETTES (Water only)  . Alcohol use No     Comment: "not in a long time"  . Drug use: No  . Sexual activity: Yes    Birth control/ protection: Implant

## 2017-07-01 ENCOUNTER — Ambulatory Visit (INDEPENDENT_AMBULATORY_CARE_PROVIDER_SITE_OTHER): Payer: Medicare Other | Admitting: Orthopedic Surgery

## 2017-07-04 ENCOUNTER — Ambulatory Visit (INDEPENDENT_AMBULATORY_CARE_PROVIDER_SITE_OTHER): Payer: Medicare Other | Admitting: Orthopedic Surgery

## 2017-07-04 ENCOUNTER — Encounter (INDEPENDENT_AMBULATORY_CARE_PROVIDER_SITE_OTHER): Payer: Self-pay | Admitting: Orthopedic Surgery

## 2017-07-04 DIAGNOSIS — Z89511 Acquired absence of right leg below knee: Secondary | ICD-10-CM

## 2017-07-04 DIAGNOSIS — E1142 Type 2 diabetes mellitus with diabetic polyneuropathy: Secondary | ICD-10-CM | POA: Diagnosis not present

## 2017-07-04 DIAGNOSIS — E1165 Type 2 diabetes mellitus with hyperglycemia: Secondary | ICD-10-CM | POA: Diagnosis not present

## 2017-07-04 DIAGNOSIS — I779 Disorder of arteries and arterioles, unspecified: Secondary | ICD-10-CM

## 2017-07-04 DIAGNOSIS — L97521 Non-pressure chronic ulcer of other part of left foot limited to breakdown of skin: Secondary | ICD-10-CM | POA: Diagnosis not present

## 2017-07-04 NOTE — Progress Notes (Signed)
Office Visit Note   Patient: [name]           Date of Birth: 16-Oct-1955           MRN: [id number] Visit Date: 07/04/2017              Requested by: Anda Kraft, MD [address] [place], [place] [address] PCP: Anda Kraft, MD  Chief Complaint  Patient presents with  . Left Foot - Follow-up      HPI: Patient is a 61 year old gentleman status post right transtibial amputation on right. is post left fourth and fifth ray amputation with venous stasis insufficiency left lower extremity currently wearing compression stockings on the left with double upright brace of the left patient working with [employer].  Assessment & Plan: Visit Diagnoses:  1. Poorly controlled type 2 diabetes mellitus with peripheral neuropathy ([place])   2. Non-pressure chronic ulcer of other part of left foot limited to breakdown of skin ([place])   3. History of right below knee amputation ([place])     Plan: We will reevaluate in 3 months for left foot check.  Follow-Up Instructions: Return in about 3 months (around 10/04/2017).   Ortho Exam  Patient is alert, oriented, no adenopathy, well-dressed, normal affect, normal respiratory effort. Examination of left foot. Is status post 4th and 5th ray amputation. This is well healed. No beakdown of the skin with plantar aspect of the foot and between the toes. There is no ascending cellulitis is foot is plantigrade no plantar ulcers.   Imaging: No results found. No images are attached to the encounter.  Labs: Lab Results  Component Value Date   HGBA1C 8.6 (H) 05/04/2016   HGBA1C 9.2 (H) 04/25/2016   HGBA1C 6.8 (H) 12/17/2015   ESRSEDRATE 35 (H) 09/03/2016   ESRSEDRATE 111 (H) 05/11/2016   ESRSEDRATE 98 (H) 05/04/2016   CRP 1.2 (H) 09/03/2016   CRP 4.7 (H) 05/11/2016   CRP 8.0 (H) 05/04/2016   REPTSTATUS 05/09/2016 FINAL 05/04/2016   GRAMSTAIN  09/26/2014    NO WBC SEEN NO SQUAMOUS EPITHELIAL CELLS SEEN NO ORGANISMS SEEN Performed at  [facility]    CULT NO GROWTH 5 DAYS 05/04/2016   LABORGA STAPHYLOCOCCUS AUREUS 09/26/2014    Orders:  No orders of the defined types were placed in this encounter.  No orders of the defined types were placed in this encounter.    Procedures: No procedures performed  Clinical Data: No additional findings.  ROS:  All other systems negative, except as noted in the HPI. Review of Systems  Constitutional: Negative for chills and fever.  Cardiovascular: Negative for leg swelling.  Skin: Negative for color change and wound.    Objective: Vital Signs: There were no vitals taken for this visit.  Specialty Comments:  No specialty comments available.  PMFS History: Patient Active Problem List   Diagnosis Date Noted  . Chronic venous hypertension (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 ([place]) 12/11/2016  . Non-pressure chronic ulcer of other part of left foot limited to breakdown of skin ([place]) 11/12/2016  . Unilateral primary osteoarthritis, left knee 10/11/2016  . Midfoot ulcer, left, limited to breakdown of skin ([place]) 10/11/2016  . Hypertension 07/30/2016  . History of right below knee amputation ([place]) 07/12/2016  . Poorly controlled type 2 diabetes mellitus with peripheral neuropathy ([place]) 05/09/2016  . Pressure ulcer 05/08/2016  . Diabetic foot infection ([place]) 05/04/2016  . Diabetic foot ([place]) 05/04/2016  . Type 2  diabetes mellitus with insulin therapy ([place]) 12/16/2015  . Amputated toe ([place]) 10/21/2015  . Diabetic foot ulcer ([place]) 10/12/2015  . Leg edema, left 09/03/2015  . Acute renal failure ([place]) 09/03/2015  . CAD (dz of distal, mid and proximal RCA with implantation of 3 overlapping drug-eluting stent,) 09/03/2015  . Chronic diastolic heart failure, NYHA class 2 ([place]) 09/03/2015  . Opacity of lung on imaging study 01/14/2015  . Angina pectoris associated with type 2 diabetes mellitus ([place]) 10/28/2014  . Hyperlipidemia 08/25/2014  .  Chronic total occlusion of artery of the extremities ([place]) 04/08/2012  . Onychomycosis 02/01/2012  . Diabetes mellitus, insulin dependent (IDDM), controlled ([place]) 11/01/2011  . Occlusion and stenosis of carotid artery without mention of cerebral infarction 06/12/2011  . Aftercare following surgery of the circulatory system, [place] 06/12/2011  . GERD (gastroesophageal reflux disease) 05/08/2011  . Barrett's esophagus without dysplasia 05/08/2011  . PVD (peripheral vascular disease) ([place]) 02/01/2011  . HTN (hypertension) 02/01/2011  . Former tobacco use 02/01/2011   Past Medical History:  Diagnosis Date  . Barrett's esophagus   . Carotid artery occlusion 11/10/10   LEFT CAROTID ENDARTERECTOMY  . CHF (congestive heart failure) ([place])   . Complication of anesthesia    BP WENT UP AT [facility] "  . COPD (chronic obstructive pulmonary disease) ([place])   . Coronary artery disease   . Diabetes mellitus   . Diverticulosis of colon (without mention of hemorrhage)   . DJD (degenerative joint disease)   . Emphysema of lung ([place])   . Fatty liver   . Full dentures   . GERD (gastroesophageal reflux disease)   . H/O Doppler ultrasound 06/28/2012   Lower venous duplex left-- findings consistent with acute deep vein thrombosis involving the personal vein of the left lower extremity. No evidence of Baker's cyst on the left. Duplex scan of the left lower extremity arteries reveal adequate blood flow.   . H/O hiatal hernia   . History of cardiovascular stress test 03/23/2011   ejection fraction measured 51%, with an end-diastolic volume of 433 ml and an end-systolic volume of 81 ml. no evidence of myocardial ischemia.  Marland Kitchen History of echocardiogram 08/22/2011   Mildly hypertrophic left ventricle with normal systolic function and mild diastolic dysfunction. mildly dilated left atrium. no significant valvular abnormalities. no pericardial effusion no change from previous study 10/2010. the ejection fraction =>55%  .  History of Holter monitoring 02/14/2012   Scan quality is fair due to baseline artifact. Predominant rhythm is Sinus. Veentricular Ectopy was noted in single beat form. The total recording time was 24:00 hours. total beats =104061. the total time analyzed was 23:58 hours. Heart  Max = 102 at 1:18:22, Min= 52 at 9:56:19 am., Avg= 70. There were 0 pause(s) > 2.5 seconds. the longest pause = 0.00 sec. at 1:51 pm(1)  . Hyperlipidemia   . Hypertension   . Neuromuscular disorder ([place])    peripheral neuropathy  . OSA (obstructive sleep apnea) 12/26/08   AHI of 6.3/hr overall, 32.7/hr during REM sleep and an RDI of 24.8/hr and 50.9/hr.  . Osteomyelitis ([place])   . Osteomyelitis ([place])    left 5th metatarsal  . PAD (peripheral artery disease) ([place])    Distal aortogram June 2012. Atherectomy left popliteal artery July 2012.   . Sleep apnea    has not used CPAP in e years - took self off  . Slurred speech    AS PER WIFE IN D/C NOTE 11/10/10  . Substernal chest pain   .  Visual color changes    LEFT EYE; BUT NOT TOTAL BLINDNESS  . Wears glasses     Family History  Problem Relation Age of Onset  . Heart disease Father        Before age 89-  CAD, BPG  . Diabetes Father        Amputation  . Cancer Father        PROSTATE  . Hyperlipidemia Father   . Hypertension Father   . Heart attack Father        Triple BPG  . Varicose Veins Father   . Colon cancer Brother   . Cancer Brother        Colon  . Diabetes Brother   . Heart disease Brother 3       A-Fib. Before age 24  . Hyperlipidemia Brother   . Hypertension Brother   . Cancer Sister        Breast  . Hyperlipidemia Sister   . Hypertension Sister   . Hypertension Son   . Arthritis Unknown        GRANDMOTHER  . Hypertension Unknown        OTHER FAMILY MEMBERS    Past Surgical History:  Procedure Laterality Date  . AMPUTATION  11/05/2011   Procedure: AMPUTATION RAY;  Surgeon: Wylene Simmer, MD;  Location: [place];  Service: Orthopedics;   Laterality: Right;  Amputation of Right 4&5th Toes  . AMPUTATION Left 11/26/2012   Procedure: AMPUTATION RAY;  Surgeon: Wylene Simmer, MD;  Location: [place];  Service: Orthopedics;  Laterality: Left;  fourth ray amputation  . AMPUTATION Right 08/27/2014   Procedure: Transmetatarsal Amputation;  Surgeon: Newt Minion, MD;  Location: [place];  Service: Orthopedics;  Laterality: Right;  . AMPUTATION Right 01/14/2015   Procedure: AMPUTATION BELOW KNEE;  Surgeon: Newt Minion, MD;  Location: [place];  Service: Orthopedics;  Laterality: Right;  . AMPUTATION Left 10/21/2015   Procedure: Left Foot 5th Ray Amputation;  Surgeon: Newt Minion, MD;  Location: [place];  Service: Orthopedics;  Laterality: Left;  . ANTERIOR FUSION CERVICAL SPINE  02/06/06   C4-5, C5-6, C6-7; SURGEON DR. MAX COHEN  . CARDIAC CATHETERIZATION  10/31/04   2009  . CAROTID ENDARTERECTOMY  11/10/10  . CAROTID ENDARTERECTOMY Left 11/10/2010   Subtotal occlusion of left internal carotid artery with left hemispheric transient ischemic attacks.  . CARPAL TUNNEL RELEASE Right 10/21/2013   Procedure: RIGHT CARPAL TUNNEL RELEASE;  Surgeon: Wynonia Sours, MD;  Location: [facility];  Service: Orthopedics;  Laterality: Right;  . CHOLECYSTECTOMY    . COLONOSCOPY    . ESOPHAGEAL MANOMETRY Bilateral 07/19/2014   Procedure: ESOPHAGEAL MANOMETRY (EM);  Surgeon: Jerene Bears, MD;  Location: [facility];  Service: Gastroenterology;  Laterality: Bilateral;  . FEMORAL ARTERY STENT     x6  . FOOT SURGERY  04/25/2016    EXCISION BASE 5TH METATARSAL AND PARTIAL CUBOID LEFT FOOT  . HERNIA REPAIR     LEFT INGUINAL AND UMBILICAL REPAIRS  . I&D EXTREMITY Left 04/25/2016   Procedure: EXCISION BASE 5TH METATARSAL AND PARTIAL CUBOID LEFT FOOT;  Surgeon: Newt Minion, MD;  Location: [place];  Service: Orthopedics;  Laterality: Left;  . JOINT REPLACEMENT Right 2001   Total  . LAMINECTOMY     X 3 LUMBAR AND X 2 CERVICAL SPINE OPERATIONS  . LAPAROSCOPIC  CHOLECYSTECTOMY W/ CHOLANGIOGRAPHY  11/09/04   SURGEON DR. Luella Cook  . LEFT HEART CATHETERIZATION WITH  CORONARY ANGIOGRAM N/A 10/29/2014   Procedure: LEFT HEART CATHETERIZATION WITH CORONARY ANGIOGRAM;  Surgeon: Laverda Page, MD;  Location: [facility] CATH LAB;  Service: Cardiovascular;  Laterality: N/A;  . LOWER EXTREMITY ANGIOGRAM N/A 03/19/2012   Procedure: LOWER EXTREMITY ANGIOGRAM;  Surgeon: Burnell Blanks, MD;  Location: [facility] CATH LAB;  Service: Cardiovascular;  Laterality: N/A;  . NECK SURGERY    . PENILE PROSTHESIS IMPLANT  08/14/05   INFRAPUBIC INSERTION OF INFLATABLE PENILE PROSTHESIS; SURGEON DR. Amalia Hailey  . PERCUTANEOUS CORONARY STENT INTERVENTION (PCI-S) Right 10/29/2014   Procedure: PERCUTANEOUS CORONARY STENT INTERVENTION (PCI-S);  Surgeon: Laverda Page, MD;  [facility] LAB;  Service: Cardiovascular;  Laterality: Right;  . SHOULDER ARTHROSCOPY    . SPINE SURGERY    . TONSILLECTOMY    . TOTAL KNEE ARTHROPLASTY  07/2002   RIGHT KNEE ; SURGEON  DR. [name] ALSO HAD ARTHROSCOPIC RIGHT KNEE IN  10/2001  . ULNAR NERVE TRANSPOSITION Right 10/21/2013   Procedure: RIGHT ELBOW  ULNAR NERVE DECOMPRESSION;  Surgeon: Wynonia Sours, MD;  Location: [place];  Service: Orthopedics;  Laterality: Right;   Social History   Occupational History  . TRUCK DRIVER Unemployed   Social History Main Topics  . Smoking status: Current Every Day Smoker    Packs/day: 0.50    Years: 35.00    Types: E-cigarettes    Last attempt to quit: 10/28/2011  . Smokeless tobacco: Never Used     Comment: VAPOR CIGARETTES (Water only)  . Alcohol use No     Comment: "not in a long time"  . Drug use: No  . Sexual activity: Yes    Birth control/ protection: Implant

## 2017-07-09 ENCOUNTER — Telehealth (INDEPENDENT_AMBULATORY_CARE_PROVIDER_SITE_OTHER): Payer: Self-pay | Admitting: Radiology

## 2017-07-10 NOTE — Telephone Encounter (Signed)
Letter for mobility chair faxed to [facility] yesterday.

## 2017-08-14 DIAGNOSIS — E7800 Pure hypercholesterolemia, unspecified: Secondary | ICD-10-CM | POA: Diagnosis not present

## 2017-08-14 DIAGNOSIS — I10 Essential (primary) hypertension: Secondary | ICD-10-CM | POA: Diagnosis not present

## 2017-08-14 DIAGNOSIS — E1140 Type 2 diabetes mellitus with diabetic neuropathy, unspecified: Secondary | ICD-10-CM | POA: Diagnosis not present

## 2017-08-21 DIAGNOSIS — E1140 Type 2 diabetes mellitus with diabetic neuropathy, unspecified: Secondary | ICD-10-CM | POA: Diagnosis not present

## 2017-08-21 DIAGNOSIS — Z79899 Other long term (current) drug therapy: Secondary | ICD-10-CM | POA: Diagnosis not present

## 2017-08-21 DIAGNOSIS — G894 Chronic pain syndrome: Secondary | ICD-10-CM | POA: Diagnosis not present

## 2017-08-28 DIAGNOSIS — H34831 Tributary (branch) retinal vein occlusion, right eye, with macular edema: Secondary | ICD-10-CM | POA: Diagnosis not present

## 2017-08-28 DIAGNOSIS — H35351 Cystoid macular degeneration, right eye: Secondary | ICD-10-CM | POA: Diagnosis not present

## 2017-08-28 DIAGNOSIS — H3561 Retinal hemorrhage, right eye: Secondary | ICD-10-CM | POA: Diagnosis not present

## 2017-09-06 DIAGNOSIS — I25119 Atherosclerotic heart disease of native coronary artery with unspecified angina pectoris: Secondary | ICD-10-CM | POA: Diagnosis not present

## 2017-09-06 DIAGNOSIS — I739 Peripheral vascular disease, unspecified: Secondary | ICD-10-CM | POA: Diagnosis not present

## 2017-09-06 DIAGNOSIS — R600 Localized edema: Secondary | ICD-10-CM | POA: Diagnosis not present

## 2017-09-20 DIAGNOSIS — R0602 Shortness of breath: Secondary | ICD-10-CM | POA: Diagnosis not present

## 2017-09-23 DIAGNOSIS — R0602 Shortness of breath: Secondary | ICD-10-CM | POA: Diagnosis not present

## 2017-09-24 DIAGNOSIS — I10 Essential (primary) hypertension: Secondary | ICD-10-CM | POA: Diagnosis not present

## 2017-09-24 DIAGNOSIS — E119 Type 2 diabetes mellitus without complications: Secondary | ICD-10-CM | POA: Diagnosis not present

## 2017-09-24 DIAGNOSIS — E1169 Type 2 diabetes mellitus with other specified complication: Secondary | ICD-10-CM | POA: Diagnosis not present

## 2017-09-25 DIAGNOSIS — E1140 Type 2 diabetes mellitus with diabetic neuropathy, unspecified: Secondary | ICD-10-CM | POA: Diagnosis not present

## 2017-09-25 DIAGNOSIS — I2510 Atherosclerotic heart disease of native coronary artery without angina pectoris: Secondary | ICD-10-CM | POA: Diagnosis not present

## 2017-09-25 DIAGNOSIS — I5032 Chronic diastolic (congestive) heart failure: Secondary | ICD-10-CM | POA: Diagnosis not present

## 2017-09-26 DIAGNOSIS — Z6833 Body mass index (BMI) 33.0-33.9, adult: Secondary | ICD-10-CM | POA: Diagnosis not present

## 2017-09-26 DIAGNOSIS — Z79899 Other long term (current) drug therapy: Secondary | ICD-10-CM | POA: Diagnosis not present

## 2017-09-26 DIAGNOSIS — Z0000 Encounter for general adult medical examination without abnormal findings: Secondary | ICD-10-CM | POA: Diagnosis not present

## 2017-09-26 DIAGNOSIS — Z125 Encounter for screening for malignant neoplasm of prostate: Secondary | ICD-10-CM | POA: Diagnosis not present

## 2017-09-26 DIAGNOSIS — Z136 Encounter for screening for cardiovascular disorders: Secondary | ICD-10-CM | POA: Diagnosis not present

## 2017-09-26 DIAGNOSIS — G894 Chronic pain syndrome: Secondary | ICD-10-CM | POA: Diagnosis not present

## 2017-09-27 DIAGNOSIS — Z79899 Other long term (current) drug therapy: Secondary | ICD-10-CM | POA: Diagnosis not present

## 2017-10-07 ENCOUNTER — Ambulatory Visit (INDEPENDENT_AMBULATORY_CARE_PROVIDER_SITE_OTHER): Payer: Medicare Other | Admitting: Orthopedic Surgery

## 2017-10-08 DIAGNOSIS — H34831 Tributary (branch) retinal vein occlusion, right eye, with macular edema: Secondary | ICD-10-CM | POA: Diagnosis not present

## 2017-10-08 DIAGNOSIS — H35351 Cystoid macular degeneration, right eye: Secondary | ICD-10-CM | POA: Diagnosis not present

## 2017-10-08 DIAGNOSIS — H43821 Vitreomacular adhesion, right eye: Secondary | ICD-10-CM | POA: Diagnosis not present

## 2017-10-11 DIAGNOSIS — R1084 Generalized abdominal pain: Secondary | ICD-10-CM | POA: Diagnosis not present

## 2017-10-11 DIAGNOSIS — R601 Generalized edema: Secondary | ICD-10-CM | POA: Diagnosis not present

## 2017-10-11 DIAGNOSIS — Z13220 Encounter for screening for lipoid disorders: Secondary | ICD-10-CM | POA: Diagnosis not present

## 2017-10-11 DIAGNOSIS — E1140 Type 2 diabetes mellitus with diabetic neuropathy, unspecified: Secondary | ICD-10-CM | POA: Diagnosis not present

## 2017-10-16 DIAGNOSIS — I5032 Chronic diastolic (congestive) heart failure: Secondary | ICD-10-CM | POA: Diagnosis not present

## 2017-10-16 DIAGNOSIS — E1140 Type 2 diabetes mellitus with diabetic neuropathy, unspecified: Secondary | ICD-10-CM | POA: Diagnosis not present

## 2017-10-16 DIAGNOSIS — I2510 Atherosclerotic heart disease of native coronary artery without angina pectoris: Secondary | ICD-10-CM | POA: Diagnosis not present

## 2017-10-22 ENCOUNTER — Ambulatory Visit (INDEPENDENT_AMBULATORY_CARE_PROVIDER_SITE_OTHER): Payer: 59 | Admitting: Orthopedic Surgery

## 2017-10-22 ENCOUNTER — Encounter (INDEPENDENT_AMBULATORY_CARE_PROVIDER_SITE_OTHER): Payer: Self-pay | Admitting: Orthopedic Surgery

## 2017-10-22 VITALS — Ht 74.0 in | Wt 250.0 lb

## 2017-10-22 DIAGNOSIS — L97929 Non-pressure chronic ulcer of unspecified part of left lower leg with unspecified severity: Secondary | ICD-10-CM

## 2017-10-22 DIAGNOSIS — I87332 Chronic venous hypertension (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: Secondary | ICD-10-CM

## 2017-10-22 DIAGNOSIS — E1142 Type 2 diabetes mellitus with diabetic polyneuropathy: Secondary | ICD-10-CM

## 2017-10-22 DIAGNOSIS — Z89511 Acquired absence of right leg below knee: Secondary | ICD-10-CM

## 2017-10-22 DIAGNOSIS — E1165 Type 2 diabetes mellitus with hyperglycemia: Secondary | ICD-10-CM

## 2017-10-22 NOTE — Progress Notes (Signed)
Office Visit Note   Patient: [name]           Date of Birth: 1956-01-22           MRN: [id number] Visit Date: 10/22/2017              Requested by: Anda Kraft, MD [address] [place], [place] [address] PCP: Anda Kraft, MD  Chief Complaint  Patient presents with  . Left Foot - Follow-up    4th and 5th ray amputation       HPI: Patient presents in follow-up for both lower extremities he has a right transtibial amputation with a prosthesis he states this is doing well he is status post a left fourth and fifth ray amputation has no complaints has venous stasis changes and currently wearing double upright brace and a medical compression stocking.  Assessment & Plan: Visit Diagnoses:  1. Poorly controlled type 2 diabetes mellitus with peripheral neuropathy ([place])   2. History of right below knee amputation ([place])   3. Chronic venous hypertension (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 (HCC)     Plan: Continue with medical compression stocking recommended exercise to improve the calf pump to prove venous return of the left lower extremity.  Recommended wide extra-depth shoes to prevent recurrent blisters on the great toe.  Follow-Up Instructions: Return in about 3 months (around 01/19/2018).   Ortho Exam  Patient is alert, oriented, no adenopathy, well-dressed, normal affect, normal respiratory effort. Examination patient ambulates with an antalgic gait.  He has a well fitting prosthesis on the right with no ulcers.  There is a left leg he has pitting edema up to the tibial tubercle.  There are no venous stasis ulcers no drainage.  The fourth and fifth ray amputations have healed well.  His foot is plantigrade he has a venous blister over the MTP joint but no open wounds.  This appears to be from previous traumatic shoe wear.  Imaging: No results found. No images are attached to the encounter.  Labs: Lab Results  Component Value  Date   HGBA1C 8.6 (H) 05/04/2016   HGBA1C 9.2 (H) 04/25/2016   HGBA1C 6.8 (H) 12/17/2015   ESRSEDRATE 35 (H) 09/03/2016   ESRSEDRATE 111 (H) 05/11/2016   ESRSEDRATE 98 (H) 05/04/2016   CRP 1.2 (H) 09/03/2016   CRP 4.7 (H) 05/11/2016   CRP 8.0 (H) 05/04/2016   REPTSTATUS 05/09/2016 FINAL 05/04/2016   GRAMSTAIN  09/26/2014    NO WBC SEEN NO SQUAMOUS EPITHELIAL CELLS SEEN NO ORGANISMS SEEN Performed at [facility]    CULT NO GROWTH 5 DAYS 05/04/2016   LABORGA STAPHYLOCOCCUS AUREUS 09/26/2014    @LABSALLVALUES (HGBA1)@  Body mass index is 32.1 kg/m.  Orders:  No orders of the defined types were placed in this encounter.  No orders of the defined types were placed in this encounter.    Procedures: No procedures performed  Clinical Data: No additional findings.  ROS:  All other systems negative, except as noted in the HPI. Review of Systems  Objective: Vital Signs: Ht 6\' 2"  (1.88 m)   Wt 250 lb (113.4 kg)   BMI 32.10 kg/m   Specialty Comments:  No specialty comments available.  PMFS History: Patient Active Problem List   Diagnosis Date Noted  . Chronic venous hypertension (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 ([place]) 12/11/2016  . Non-pressure chronic ulcer of other part of left foot limited to breakdown of  skin ([place]) 11/12/2016  . Unilateral primary osteoarthritis, left knee 10/11/2016  . Midfoot ulcer, left, limited to breakdown of skin ([place]) 10/11/2016  . Hypertension 07/30/2016  . History of right below knee amputation ([place]) 07/12/2016  . Poorly controlled type 2 diabetes mellitus with peripheral neuropathy ([place]) 05/09/2016  . Pressure ulcer 05/08/2016  . Diabetic foot infection ([place]) 05/04/2016  . Diabetic foot ([place]) 05/04/2016  . Type 2 diabetes mellitus with insulin therapy ([place]) 12/16/2015  . Amputated toe ([place]) 10/21/2015  . Diabetic foot ulcer ([place]) 10/12/2015  . Leg edema, left 09/03/2015  . Acute renal failure  ([place]) 09/03/2015  . CAD (dz of distal, mid and proximal RCA with implantation of 3 overlapping drug-eluting stent,) 09/03/2015  . Chronic diastolic heart failure, NYHA class 2 ([place]) 09/03/2015  . Opacity of lung on imaging study 01/14/2015  . Angina pectoris associated with type 2 diabetes mellitus ([place]) 10/28/2014  . Hyperlipidemia 08/25/2014  . Chronic total occlusion of artery of the extremities ([place]) 04/08/2012  . Onychomycosis 02/01/2012  . Diabetes mellitus, insulin dependent (IDDM), controlled ([place]) 11/01/2011  . Occlusion and stenosis of carotid artery without mention of cerebral infarction 06/12/2011  . Aftercare following surgery of the circulatory system, [place] 06/12/2011  . GERD (gastroesophageal reflux disease) 05/08/2011  . Barrett's esophagus without dysplasia 05/08/2011  . PVD (peripheral vascular disease) ([place]) 02/01/2011  . HTN (hypertension) 02/01/2011  . Former tobacco use 02/01/2011   Past Medical History:  Diagnosis Date  . Barrett's esophagus   . Carotid artery occlusion 11/10/10   LEFT CAROTID ENDARTERECTOMY  . CHF (congestive heart failure) ([place])   . Complication of anesthesia    BP WENT UP AT [facility] "  . COPD (chronic obstructive pulmonary disease) ([place])   . Coronary artery disease   . Diabetes mellitus   . Diverticulosis of colon (without mention of hemorrhage)   . DJD (degenerative joint disease)   . Emphysema of lung ([place])   . Fatty liver   . Full dentures   . GERD (gastroesophageal reflux disease)   . H/O Doppler ultrasound 06/28/2012   Lower venous duplex left-- findings consistent with acute deep vein thrombosis involving the personal vein of the left lower extremity. No evidence of Baker's cyst on the left. Duplex scan of the left lower extremity arteries reveal adequate blood flow.   . H/O hiatal hernia   . History of cardiovascular stress test 03/23/2011   ejection fraction measured 51%, with an end-diastolic volume of 315 ml and an end-systolic  volume of 81 ml. no evidence of myocardial ischemia.  Marland Kitchen History of echocardiogram 08/22/2011   Mildly hypertrophic left ventricle with normal systolic function and mild diastolic dysfunction. mildly dilated left atrium. no significant valvular abnormalities. no pericardial effusion no change from previous study 10/2010. the ejection fraction =>55%  . History of Holter monitoring 02/14/2012   Scan quality is fair due to baseline artifact. Predominant rhythm is Sinus. Veentricular Ectopy was noted in single beat form. The total recording time was 24:00 hours. total beats =104061. the total time analyzed was 23:58 hours. Heart  Max = 102 at 1:18:22, Min= 52 at 9:56:19 am., Avg= 70. There were 0 pause(s) > 2.5 seconds. the longest pause = 0.00 sec. at 1:51 pm(1)  . Hyperlipidemia   . Hypertension   . Neuromuscular disorder ([place])    peripheral neuropathy  . OSA (obstructive sleep apnea) 12/26/08   AHI of 6.3/hr overall, 32.7/hr during REM sleep and an RDI of 24.8/hr and 50.9/hr.  .  Osteomyelitis ([place])   . Osteomyelitis ([place])    left 5th metatarsal  . PAD (peripheral artery disease) ([place])    Distal aortogram June 2012. Atherectomy left popliteal artery July 2012.   . Sleep apnea    has not used CPAP in e years - took self off  . Slurred speech    AS PER WIFE IN D/C NOTE 11/10/10  . Substernal chest pain   . Visual color changes    LEFT EYE; BUT NOT TOTAL BLINDNESS  . Wears glasses     Family History  Problem Relation Age of Onset  . Heart disease Father        Before age 81-  CAD, BPG  . Diabetes Father        Amputation  . Cancer Father        PROSTATE  . Hyperlipidemia Father   . Hypertension Father   . Heart attack Father        Triple BPG  . Varicose Veins Father   . Colon cancer Brother   . Cancer Brother        Colon  . Diabetes Brother   . Heart disease Brother 71       A-Fib. Before age 27  . Hyperlipidemia Brother   . Hypertension Brother   . Cancer Sister        Breast    . Hyperlipidemia Sister   . Hypertension Sister   . Hypertension Son   . Arthritis Unknown        GRANDMOTHER  . Hypertension Unknown        OTHER FAMILY MEMBERS    Past Surgical History:  Procedure Laterality Date  . AMPUTATION  11/05/2011   Procedure: AMPUTATION RAY;  Surgeon: Wylene Simmer, MD;  Location: [place];  Service: Orthopedics;  Laterality: Right;  Amputation of Right 4&5th Toes  . AMPUTATION Left 11/26/2012   Procedure: AMPUTATION RAY;  Surgeon: Wylene Simmer, MD;  Location: [employer] [employer];  Service: Orthopedics;  Laterality: Left;  fourth ray amputation  . AMPUTATION Right 08/27/2014   Procedure: Transmetatarsal Amputation;  Surgeon: Newt Minion, MD;  Location: [place];  Service: Orthopedics;  Laterality: Right;  . AMPUTATION Right 01/14/2015   Procedure: AMPUTATION BELOW KNEE;  Surgeon: Newt Minion, MD;  Location: [place];  Service: Orthopedics;  Laterality: Right;  . AMPUTATION Left 10/21/2015   Procedure: Left Foot 5th Ray Amputation;  Surgeon: Newt Minion, MD;  Location: [place];  Service: Orthopedics;  Laterality: Left;  . ANTERIOR FUSION CERVICAL SPINE  02/06/06   C4-5, C5-6, C6-7; SURGEON DR. MAX COHEN  . CARDIAC CATHETERIZATION  10/31/04   2009  . CAROTID ENDARTERECTOMY  11/10/10  . CAROTID ENDARTERECTOMY Left 11/10/2010   Subtotal occlusion of left internal carotid artery with left hemispheric transient ischemic attacks.  . CARPAL TUNNEL RELEASE Right 10/21/2013   Procedure: RIGHT CARPAL TUNNEL RELEASE;  Surgeon: Wynonia Sours, MD;  Location: [place];  Service: Orthopedics;  Laterality: Right;  . CHOLECYSTECTOMY    . COLONOSCOPY    . ESOPHAGEAL MANOMETRY Bilateral 07/19/2014   Procedure: ESOPHAGEAL MANOMETRY (EM);  Surgeon: Jerene Bears, MD;  [facility];  Service: Gastroenterology;  Laterality: Bilateral;  . FEMORAL ARTERY STENT     x6  . FOOT SURGERY  04/25/2016    EXCISION BASE 5TH METATARSAL AND PARTIAL CUBOID LEFT FOOT  . HERNIA REPAIR     LEFT  INGUINAL AND UMBILICAL REPAIRS  . I&D EXTREMITY Left  04/25/2016   Procedure: EXCISION BASE 5TH METATARSAL AND PARTIAL CUBOID LEFT FOOT;  Surgeon: Newt Minion, MD;  Location: [place];  Service: Orthopedics;  Laterality: Left;  . JOINT REPLACEMENT Right 2001   Total  . LAMINECTOMY     X 3 LUMBAR AND X 2 CERVICAL SPINE OPERATIONS  . LAPAROSCOPIC CHOLECYSTECTOMY W/ CHOLANGIOGRAPHY  11/09/04   SURGEON DR. Luella Cook  . LEFT HEART CATHETERIZATION WITH CORONARY ANGIOGRAM N/A 10/29/2014   Procedure: LEFT HEART CATHETERIZATION WITH CORONARY ANGIOGRAM;  Surgeon: Laverda Page, MD;  Location[facility] C[facility] LAB;  Service: Cardiovascular;  Laterality: N/A;  . LOWER EXTREMITY ANGIOGRAM N/A 03/19/2012   Procedure: LOWER EXTREMITY ANGIOGRAM;  Surgeon: Burnell Blanks, MD;  Location: [facility] CATH LAB;  Service: Cardiovascular;  Laterality: N/A;  . NECK SURGERY    . PENILE PROSTHESIS IMPLANT  08/14/05   INFRAPUBIC INSERTION OF INFLATABLE PENILE PROSTHESIS; SURGEON DR. Amalia Hailey  . PERCUTANEOUS CORONARY STENT INTERVENTION (PCI-S) Right 10/29/2014   Procedure: PERCUTANEOUS CORONARY STENT INTERVENTION (PCI-S);  Surgeon: Laverda Page, MD;  Location: [facility] CATH LAB;  Service: Cardiovascular;  Laterality: Right;  . SHOULDER ARTHROSCOPY    . SPINE SURGERY    . TONSILLECTOMY    . TOTAL KNEE ARTHROPLASTY  07/2002   RIGHT KNEE ; SURGEON  DR. [name] ALSO HAD ARTHROSCOPIC RIGHT KNEE IN  10/2001  . ULNAR NERVE TRANSPOSITION Right 10/21/2013   Procedure: RIGHT ELBOW  ULNAR NERVE DECOMPRESSION;  Surgeon: Wynonia Sours, MD;  Location: [place];  Service: Orthopedics;  Laterality: Right;   Social History   Occupational History  . Occupation: Magazine features editor: UNEMPLOYED  Tobacco Use  . Smoking status: Current Every Day Smoker    Packs/day: 0.50    Years: 35.00    Pack years: 17.50    Types: E-cigarettes    Last attempt to quit: 10/28/2011    Years since quitting: 5.9  . Smokeless tobacco: Never  Used  . Tobacco comment: VAPOR CIGARETTES (Water only)  Substance and Sexual Activity  . Alcohol use: No    Alcohol/week: 0.0 oz    Comment: "not in a long time"  . Drug use: No  . Sexual activity: Yes    Birth control/protection: Implant

## 2017-10-24 DIAGNOSIS — H35351 Cystoid macular degeneration, right eye: Secondary | ICD-10-CM | POA: Diagnosis not present

## 2017-10-24 DIAGNOSIS — H348322 Tributary (branch) retinal vein occlusion, left eye, stable: Secondary | ICD-10-CM | POA: Diagnosis not present

## 2017-10-24 DIAGNOSIS — H34831 Tributary (branch) retinal vein occlusion, right eye, with macular edema: Secondary | ICD-10-CM | POA: Diagnosis not present

## 2017-10-28 DIAGNOSIS — R109 Unspecified abdominal pain: Secondary | ICD-10-CM | POA: Diagnosis not present

## 2017-10-28 DIAGNOSIS — I25118 Atherosclerotic heart disease of native coronary artery with other forms of angina pectoris: Secondary | ICD-10-CM | POA: Diagnosis not present

## 2017-10-29 DIAGNOSIS — R109 Unspecified abdominal pain: Secondary | ICD-10-CM | POA: Diagnosis not present

## 2017-10-31 ENCOUNTER — Other Ambulatory Visit: Payer: Self-pay | Admitting: Family Medicine

## 2017-10-31 ENCOUNTER — Ambulatory Visit
Admission: RE | Admit: 2017-10-31 | Discharge: 2017-10-31 | Disposition: A | Discharge status: Home / Self Care | Payer: Medicare Other | Source: Ambulatory Visit | Attending: Family Medicine | Admitting: Family Medicine

## 2017-10-31 DIAGNOSIS — R109 Unspecified abdominal pain: Secondary | ICD-10-CM

## 2017-10-31 DIAGNOSIS — N200 Calculus of kidney: Secondary | ICD-10-CM | POA: Diagnosis not present

## 2017-11-04 ENCOUNTER — Other Ambulatory Visit: Payer: Self-pay

## 2017-11-04 ENCOUNTER — Inpatient Hospital Stay (HOSPITAL_COMMUNITY): Payer: 59

## 2017-11-04 ENCOUNTER — Encounter (HOSPITAL_COMMUNITY): Payer: Self-pay | Admitting: *Deleted

## 2017-11-04 ENCOUNTER — Inpatient Hospital Stay (HOSPITAL_COMMUNITY)
Admission: EM | Admit: 2017-11-04 | Discharge: 2017-11-10 | DRG: 392 | Disposition: A | Discharge status: Home Health | Payer: 59 | Attending: Internal Medicine | Admitting: Internal Medicine

## 2017-11-04 DIAGNOSIS — K5780 Diverticulitis of intestine, part unspecified, with perforation and abscess without bleeding: Secondary | ICD-10-CM | POA: Diagnosis not present

## 2017-11-04 DIAGNOSIS — T360X5A Adverse effect of penicillins, initial encounter: Secondary | ICD-10-CM | POA: Diagnosis not present

## 2017-11-04 DIAGNOSIS — E119 Type 2 diabetes mellitus without complications: Secondary | ICD-10-CM

## 2017-11-04 DIAGNOSIS — Z833 Family history of diabetes mellitus: Secondary | ICD-10-CM

## 2017-11-04 DIAGNOSIS — N179 Acute kidney failure, unspecified: Secondary | ICD-10-CM | POA: Diagnosis not present

## 2017-11-04 DIAGNOSIS — Y92230 Patient room in hospital as the place of occurrence of the external cause: Secondary | ICD-10-CM | POA: Diagnosis not present

## 2017-11-04 DIAGNOSIS — Z8349 Family history of other endocrine, nutritional and metabolic diseases: Secondary | ICD-10-CM

## 2017-11-04 DIAGNOSIS — D631 Anemia in chronic kidney disease: Secondary | ICD-10-CM | POA: Diagnosis present

## 2017-11-04 DIAGNOSIS — K651 Peritoneal abscess: Secondary | ICD-10-CM

## 2017-11-04 DIAGNOSIS — E1142 Type 2 diabetes mellitus with diabetic polyneuropathy: Secondary | ICD-10-CM | POA: Diagnosis present

## 2017-11-04 DIAGNOSIS — E876 Hypokalemia: Secondary | ICD-10-CM | POA: Diagnosis present

## 2017-11-04 DIAGNOSIS — K5792 Diverticulitis of intestine, part unspecified, without perforation or abscess without bleeding: Secondary | ICD-10-CM | POA: Diagnosis present

## 2017-11-04 DIAGNOSIS — Z981 Arthrodesis status: Secondary | ICD-10-CM

## 2017-11-04 DIAGNOSIS — K219 Gastro-esophageal reflux disease without esophagitis: Secondary | ICD-10-CM | POA: Diagnosis present

## 2017-11-04 DIAGNOSIS — E1122 Type 2 diabetes mellitus with diabetic chronic kidney disease: Secondary | ICD-10-CM | POA: Diagnosis present

## 2017-11-04 DIAGNOSIS — Z8673 Personal history of transient ischemic attack (TIA), and cerebral infarction without residual deficits: Secondary | ICD-10-CM

## 2017-11-04 DIAGNOSIS — IMO0001 Reserved for inherently not codable concepts without codable children: Secondary | ICD-10-CM

## 2017-11-04 DIAGNOSIS — Z7902 Long term (current) use of antithrombotics/antiplatelets: Secondary | ICD-10-CM

## 2017-11-04 DIAGNOSIS — K2270 Barrett's esophagus without dysplasia: Secondary | ICD-10-CM | POA: Diagnosis present

## 2017-11-04 DIAGNOSIS — K521 Toxic gastroenteritis and colitis: Secondary | ICD-10-CM | POA: Diagnosis not present

## 2017-11-04 DIAGNOSIS — N183 Chronic kidney disease, stage 3 (moderate): Secondary | ICD-10-CM | POA: Diagnosis present

## 2017-11-04 DIAGNOSIS — Z8042 Family history of malignant neoplasm of prostate: Secondary | ICD-10-CM

## 2017-11-04 DIAGNOSIS — E669 Obesity, unspecified: Secondary | ICD-10-CM | POA: Diagnosis present

## 2017-11-04 DIAGNOSIS — Z7982 Long term (current) use of aspirin: Secondary | ICD-10-CM

## 2017-11-04 DIAGNOSIS — K5720 Diverticulitis of large intestine with perforation and abscess without bleeding: Secondary | ICD-10-CM | POA: Diagnosis not present

## 2017-11-04 DIAGNOSIS — R7989 Other specified abnormal findings of blood chemistry: Secondary | ICD-10-CM

## 2017-11-04 DIAGNOSIS — Z803 Family history of malignant neoplasm of breast: Secondary | ICD-10-CM

## 2017-11-04 DIAGNOSIS — L02211 Cutaneous abscess of abdominal wall: Secondary | ICD-10-CM | POA: Diagnosis not present

## 2017-11-04 DIAGNOSIS — Z96651 Presence of right artificial knee joint: Secondary | ICD-10-CM | POA: Diagnosis present

## 2017-11-04 DIAGNOSIS — I5032 Chronic diastolic (congestive) heart failure: Secondary | ICD-10-CM | POA: Diagnosis present

## 2017-11-04 DIAGNOSIS — Z91041 Radiographic dye allergy status: Secondary | ICD-10-CM

## 2017-11-04 DIAGNOSIS — G894 Chronic pain syndrome: Secondary | ICD-10-CM | POA: Diagnosis present

## 2017-11-04 DIAGNOSIS — B9689 Other specified bacterial agents as the cause of diseases classified elsewhere: Secondary | ICD-10-CM | POA: Diagnosis present

## 2017-11-04 DIAGNOSIS — Z6833 Body mass index (BMI) 33.0-33.9, adult: Secondary | ICD-10-CM | POA: Diagnosis not present

## 2017-11-04 DIAGNOSIS — E1151 Type 2 diabetes mellitus with diabetic peripheral angiopathy without gangrene: Secondary | ICD-10-CM | POA: Diagnosis present

## 2017-11-04 DIAGNOSIS — J449 Chronic obstructive pulmonary disease, unspecified: Secondary | ICD-10-CM | POA: Diagnosis present

## 2017-11-04 DIAGNOSIS — Z955 Presence of coronary angioplasty implant and graft: Secondary | ICD-10-CM

## 2017-11-04 DIAGNOSIS — I5030 Unspecified diastolic (congestive) heart failure: Secondary | ICD-10-CM | POA: Diagnosis present

## 2017-11-04 DIAGNOSIS — Z8249 Family history of ischemic heart disease and other diseases of the circulatory system: Secondary | ICD-10-CM

## 2017-11-04 DIAGNOSIS — I739 Peripheral vascular disease, unspecified: Secondary | ICD-10-CM | POA: Diagnosis not present

## 2017-11-04 DIAGNOSIS — Z800 Family history of malignant neoplasm of digestive organs: Secondary | ICD-10-CM

## 2017-11-04 DIAGNOSIS — R7881 Bacteremia: Secondary | ICD-10-CM | POA: Diagnosis not present

## 2017-11-04 DIAGNOSIS — Z794 Long term (current) use of insulin: Secondary | ICD-10-CM

## 2017-11-04 DIAGNOSIS — G4733 Obstructive sleep apnea (adult) (pediatric): Secondary | ICD-10-CM | POA: Diagnosis present

## 2017-11-04 DIAGNOSIS — Z91048 Other nonmedicinal substance allergy status: Secondary | ICD-10-CM

## 2017-11-04 DIAGNOSIS — N400 Enlarged prostate without lower urinary tract symptoms: Secondary | ICD-10-CM | POA: Diagnosis present

## 2017-11-04 DIAGNOSIS — Z87892 Personal history of anaphylaxis: Secondary | ICD-10-CM

## 2017-11-04 DIAGNOSIS — Z95820 Peripheral vascular angioplasty status with implants and grafts: Secondary | ICD-10-CM

## 2017-11-04 DIAGNOSIS — I2510 Atherosclerotic heart disease of native coronary artery without angina pectoris: Secondary | ICD-10-CM | POA: Diagnosis present

## 2017-11-04 DIAGNOSIS — K5732 Diverticulitis of large intestine without perforation or abscess without bleeding: Secondary | ICD-10-CM | POA: Diagnosis not present

## 2017-11-04 DIAGNOSIS — E785 Hyperlipidemia, unspecified: Secondary | ICD-10-CM | POA: Diagnosis present

## 2017-11-04 DIAGNOSIS — I130 Hypertensive heart and chronic kidney disease with heart failure and stage 1 through stage 4 chronic kidney disease, or unspecified chronic kidney disease: Secondary | ICD-10-CM | POA: Diagnosis present

## 2017-11-04 DIAGNOSIS — Z89511 Acquired absence of right leg below knee: Secondary | ICD-10-CM | POA: Diagnosis not present

## 2017-11-04 DIAGNOSIS — Z9049 Acquired absence of other specified parts of digestive tract: Secondary | ICD-10-CM

## 2017-11-04 DIAGNOSIS — K760 Fatty (change of) liver, not elsewhere classified: Secondary | ICD-10-CM | POA: Diagnosis present

## 2017-11-04 DIAGNOSIS — R740 Nonspecific elevation of levels of transaminase and lactic acid dehydrogenase [LDH]: Secondary | ICD-10-CM | POA: Diagnosis not present

## 2017-11-04 DIAGNOSIS — I10 Essential (primary) hypertension: Secondary | ICD-10-CM | POA: Diagnosis not present

## 2017-11-04 DIAGNOSIS — M7989 Other specified soft tissue disorders: Secondary | ICD-10-CM | POA: Diagnosis not present

## 2017-11-04 HISTORY — DX: Diverticulitis of intestine, part unspecified, without perforation or abscess without bleeding: K57.92

## 2017-11-04 LAB — COMPREHENSIVE METABOLIC PANEL
ALT: 13 U/L — ABNORMAL LOW (ref 17–63)
AST: 26 U/L (ref 15–41)
Albumin: 3 g/dL — ABNORMAL LOW (ref 3.5–5.0)
Alkaline Phosphatase: 106 U/L (ref 38–126)
Anion gap: 9 (ref 5–15)
BUN: 7 mg/dL (ref 6–20)
CO2: 24 mmol/L (ref 22–32)
Calcium: 8.5 mg/dL — ABNORMAL LOW (ref 8.9–10.3)
Chloride: 106 mmol/L (ref 101–111)
Creatinine, Ser: 1.2 mg/dL (ref 0.61–1.24)
GFR calc Af Amer: >60 mL/min (ref >60)
GFR calc non Af Amer: >60 mL/min (ref >60)
Glucose, Bld: 105 mg/dL — ABNORMAL HIGH (ref 65–99)
Potassium: 3.3 mmol/L — ABNORMAL LOW (ref 3.5–5.1)
Sodium: 139 mmol/L (ref 135–145)
Total Bilirubin: 0.5 mg/dL (ref 0.3–1.2)
Total Protein: 6.1 g/dL — ABNORMAL LOW (ref 6.5–8.1)

## 2017-11-04 LAB — PROTIME-INR
INR: 1.05
Prothrombin Time: 13.6 seconds (ref 11.4–15.2)

## 2017-11-04 LAB — CBC WITH DIFFERENTIAL/PLATELET
Basophils Absolute: 0 10*3/uL (ref 0.0–0.1)
Basophils Relative: 0 %
Eosinophils Absolute: 0.2 10*3/uL (ref 0.0–0.7)
Eosinophils Relative: 3 %
HCT: 31.3 % — ABNORMAL LOW (ref 39.0–52.0)
Hemoglobin: 10.6 g/dL — ABNORMAL LOW (ref 13.0–17.0)
Lymphocytes Relative: 20 %
Lymphs Abs: 1.3 10*3/uL (ref 0.7–4.0)
MCH: 27.3 pg (ref 26.0–34.0)
MCHC: 33.9 g/dL (ref 30.0–36.0)
MCV: 80.7 fL (ref 78.0–100.0)
Monocytes Absolute: 0.5 10*3/uL (ref 0.1–1.0)
Monocytes Relative: 8 %
Neutro Abs: 4.6 10*3/uL (ref 1.7–7.7)
Neutrophils Relative %: 69 %
Platelets: 265 10*3/uL (ref 150–400)
RBC: 3.88 MIL/uL — ABNORMAL LOW (ref 4.22–5.81)
RDW: 13.2 % (ref 11.5–15.5)
WBC: 6.6 10*3/uL (ref 4.0–10.5)

## 2017-11-04 LAB — URINALYSIS, ROUTINE W REFLEX MICROSCOPIC
Bacteria, UA: NONE SEEN
Bilirubin Urine: NEGATIVE
Glucose, UA: 50 mg/dL — AB
Hgb urine dipstick: NEGATIVE
Ketones, ur: NEGATIVE mg/dL
Leukocytes, UA: NEGATIVE
Nitrite: NEGATIVE
Protein, ur: 100 mg/dL — AB
Specific Gravity, Urine: 1.003 — ABNORMAL LOW (ref 1.005–1.030)
pH: 7 (ref 5.0–8.0)

## 2017-11-04 LAB — CBG MONITORING, ED
Glucose-Capillary: 69 mg/dL (ref 65–99)
Glucose-Capillary: 53 mg/dL — ABNORMAL LOW (ref 65–99)
Glucose-Capillary: 149 mg/dL — ABNORMAL HIGH (ref 65–99)

## 2017-11-04 LAB — I-STAT CG4 LACTIC ACID, ED
Lactic Acid, Venous: 1.92 mmol/L — ABNORMAL HIGH (ref 0.5–1.9)
Lactic Acid, Venous: 1.53 mmol/L (ref 0.5–1.9)

## 2017-11-04 LAB — GLUCOSE, CAPILLARY: Glucose-Capillary: 165 mg/dL — ABNORMAL HIGH (ref 65–99)

## 2017-11-04 MED ORDER — SODIUM CHLORIDE 0.9 % IV BOLUS (SEPSIS)
1000.0000 mL | Freq: Once | INTRAVENOUS | Status: AC
  Administered 2017-11-04: 1000 mL via INTRAVENOUS

## 2017-11-04 MED ORDER — ONDANSETRON HCL 4 MG/2ML IJ SOLN: 4.0000 mg | Freq: Four times a day (QID) | INTRAMUSCULAR | Status: DC | PRN

## 2017-11-04 MED ORDER — CARVEDILOL 12.5 MG PO TABS
12.5000 mg | ORAL_TABLET | Freq: Two times a day (BID) | ORAL | Status: DC
  Administered 2017-11-05 – 2017-11-07 (×6): 12.5 mg via ORAL
  Filled 2017-11-04 (×7): qty 1

## 2017-11-04 MED ORDER — OXYCODONE-ACETAMINOPHEN 5-325 MG PO TABS
1.0000 | ORAL_TABLET | Freq: Three times a day (TID) | ORAL | Status: DC | PRN
  Administered 2017-11-04 – 2017-11-10 (×12): 1 via ORAL
  Filled 2017-11-04 (×13): qty 1

## 2017-11-04 MED ORDER — HYDROMORPHONE HCL 1 MG/ML IJ SOLN
1.0000 mg | Freq: Once | INTRAMUSCULAR | Status: AC
  Administered 2017-11-04: 1 mg via INTRAVENOUS
  Filled 2017-11-04: qty 1

## 2017-11-04 MED ORDER — HYDROMORPHONE HCL 1 MG/ML IJ SOLN
0.5000 mg | INTRAMUSCULAR | Status: DC | PRN
Start: 2017-11-04 — End: 2017-11-10
  Administered 2017-11-04 – 2017-11-09 (×16): 0.5 mg via INTRAVENOUS
  Filled 2017-11-04 (×16): qty 1

## 2017-11-04 MED ORDER — DEXTROSE 50 % IV SOLN
1.0000 | Freq: Once | INTRAVENOUS | Status: AC
  Administered 2017-11-04: 50 mL via INTRAVENOUS

## 2017-11-04 MED ORDER — FENTANYL CITRATE (PF) 100 MCG/2ML IJ SOLN
INTRAMUSCULAR | Status: AC | PRN
  Administered 2017-11-04: 50 ug via INTRAVENOUS

## 2017-11-04 MED ORDER — LISINOPRIL 10 MG PO TABS
10.0000 mg | ORAL_TABLET | Freq: Every day | ORAL | Status: DC
  Administered 2017-11-04 – 2017-11-05 (×2): 10 mg via ORAL
  Filled 2017-11-04 (×2): qty 1

## 2017-11-04 MED ORDER — HYDROMORPHONE HCL 1 MG/ML IJ SOLN
INTRAMUSCULAR | Status: AC
  Filled 2017-11-04: qty 1

## 2017-11-04 MED ORDER — FENTANYL 50 MCG/HR TD PT72
50.0000 ug | MEDICATED_PATCH | TRANSDERMAL | Status: DC
  Administered 2017-11-05 – 2017-11-08 (×2): 50 ug via TRANSDERMAL
  Filled 2017-11-04 (×4): qty 1

## 2017-11-04 MED ORDER — AMLODIPINE BESYLATE 5 MG PO TABS
5.0000 mg | ORAL_TABLET | Freq: Every day | ORAL | Status: DC
  Administered 2017-11-05 – 2017-11-07 (×3): 5 mg via ORAL
  Filled 2017-11-04 (×3): qty 1

## 2017-11-04 MED ORDER — SODIUM CHLORIDE 0.9% FLUSH
5.0000 mL | Freq: Three times a day (TID) | INTRAVENOUS | Status: DC
  Administered 2017-11-05 – 2017-11-10 (×18): 5 mL

## 2017-11-04 MED ORDER — LIDOCAINE HCL 1 % IJ SOLN
INTRAMUSCULAR | Status: AC
  Filled 2017-11-04: qty 20

## 2017-11-04 MED ORDER — FENTANYL CITRATE (PF) 100 MCG/2ML IJ SOLN
INTRAMUSCULAR | Status: AC | PRN
  Administered 2017-11-04: 25 ug via INTRAVENOUS

## 2017-11-04 MED ORDER — ACETAMINOPHEN 325 MG PO TABS: 650.0000 mg | ORAL_TABLET | Freq: Four times a day (QID) | ORAL | Status: DC | PRN

## 2017-11-04 MED ORDER — INSULIN GLARGINE 100 UNIT/ML ~~LOC~~ SOLN
20.0000 [IU] | Freq: Every day | SUBCUTANEOUS | Status: DC
  Filled 2017-11-04: qty 0.2

## 2017-11-04 MED ORDER — MIDAZOLAM HCL 2 MG/2ML IJ SOLN
INTRAMUSCULAR | Status: AC | PRN
  Administered 2017-11-04: 0.5 mg via INTRAVENOUS

## 2017-11-04 MED ORDER — ACETAMINOPHEN 650 MG RE SUPP: 650.0000 mg | Freq: Four times a day (QID) | RECTAL | Status: DC | PRN

## 2017-11-04 MED ORDER — PIPERACILLIN-TAZOBACTAM 3.375 G IVPB 30 MIN
3.3750 g | Freq: Once | INTRAVENOUS | Status: AC
  Administered 2017-11-04: 3.375 g via INTRAVENOUS
  Filled 2017-11-04: qty 50

## 2017-11-04 MED ORDER — DEXTROSE-NACL 5-0.45 % IV SOLN
INTRAVENOUS | Status: DC
  Administered 2017-11-04: 75 mL/h via INTRAVENOUS
  Administered 2017-11-05: 08:00:00 via INTRAVENOUS

## 2017-11-04 MED ORDER — DEXTROSE 50 % IV SOLN
INTRAVENOUS | Status: AC
  Filled 2017-11-04: qty 50

## 2017-11-04 MED ORDER — MIDAZOLAM HCL 2 MG/2ML IJ SOLN
INTRAMUSCULAR | Status: AC | PRN
  Administered 2017-11-04: 1 mg via INTRAVENOUS

## 2017-11-04 MED ORDER — MIDAZOLAM HCL 2 MG/2ML IJ SOLN
INTRAMUSCULAR | Status: AC
  Filled 2017-11-04: qty 2

## 2017-11-04 MED ORDER — ATORVASTATIN CALCIUM 40 MG PO TABS
40.0000 mg | ORAL_TABLET | Freq: Every day | ORAL | Status: DC
  Administered 2017-11-05 – 2017-11-09 (×5): 40 mg via ORAL
  Filled 2017-11-04 (×5): qty 1

## 2017-11-04 MED ORDER — HYDRALAZINE HCL 20 MG/ML IJ SOLN: 5.0000 mg | INTRAMUSCULAR | Status: DC | PRN

## 2017-11-04 MED ORDER — IRBESARTAN 300 MG PO TABS
150.0000 mg | ORAL_TABLET | Freq: Every day | ORAL | Status: DC
  Administered 2017-11-05: 150 mg via ORAL
  Filled 2017-11-04: qty 1

## 2017-11-04 MED ORDER — INSULIN ASPART 100 UNIT/ML ~~LOC~~ SOLN
0.0000 [IU] | SUBCUTANEOUS | Status: DC
  Administered 2017-11-04 – 2017-11-05 (×2): 2 [IU] via SUBCUTANEOUS
  Administered 2017-11-05 (×2): 1 [IU] via SUBCUTANEOUS
  Administered 2017-11-05: 2 [IU] via SUBCUTANEOUS
  Administered 2017-11-06: 7 [IU] via SUBCUTANEOUS
  Administered 2017-11-06: 3 [IU] via SUBCUTANEOUS
  Administered 2017-11-06: 2 [IU] via SUBCUTANEOUS
  Administered 2017-11-06: 3 [IU] via SUBCUTANEOUS
  Administered 2017-11-06: 1 [IU] via SUBCUTANEOUS
  Administered 2017-11-07: 2 [IU] via SUBCUTANEOUS
  Administered 2017-11-07 (×3): 5 [IU] via SUBCUTANEOUS
  Administered 2017-11-07: 9 [IU] via SUBCUTANEOUS
  Administered 2017-11-07 – 2017-11-08 (×2): 2 [IU] via SUBCUTANEOUS
  Administered 2017-11-08 (×2): 7 [IU] via SUBCUTANEOUS
  Administered 2017-11-08: 5 [IU] via SUBCUTANEOUS
  Administered 2017-11-08: 2 [IU] via SUBCUTANEOUS
  Administered 2017-11-08: 7 [IU] via SUBCUTANEOUS
  Administered 2017-11-09: 3 [IU] via SUBCUTANEOUS
  Administered 2017-11-09 (×2): 1 [IU] via SUBCUTANEOUS
  Administered 2017-11-09 (×2): 3 [IU] via SUBCUTANEOUS
  Administered 2017-11-09 – 2017-11-10 (×3): 2 [IU] via SUBCUTANEOUS

## 2017-11-04 MED ORDER — POTASSIUM CHLORIDE 10 MEQ/100ML IV SOLN: 10.0000 meq | INTRAVENOUS | Status: AC

## 2017-11-04 MED ORDER — PIPERACILLIN-TAZOBACTAM 3.375 G IVPB
3.3750 g | Freq: Three times a day (TID) | INTRAVENOUS | Status: DC
  Administered 2017-11-04 – 2017-11-08 (×12): 3.375 g via INTRAVENOUS
  Filled 2017-11-04 (×13): qty 50

## 2017-11-04 MED ORDER — FENTANYL CITRATE (PF) 100 MCG/2ML IJ SOLN
INTRAMUSCULAR | Status: AC
  Filled 2017-11-04: qty 2

## 2017-11-04 MED ORDER — ISOSORBIDE MONONITRATE ER 60 MG PO TB24: 120.0000 mg | ORAL_TABLET | Freq: Every day | ORAL | Status: DC

## 2017-11-04 MED ORDER — ISOSORBIDE MONONITRATE ER 60 MG PO TB24
120.0000 mg | ORAL_TABLET | Freq: Every day | ORAL | Status: DC
  Administered 2017-11-04 – 2017-11-05 (×2): 120 mg via ORAL
  Filled 2017-11-04 (×2): qty 2

## 2017-11-04 MED ORDER — ONDANSETRON HCL 4 MG PO TABS: 4.0000 mg | ORAL_TABLET | Freq: Four times a day (QID) | ORAL | Status: DC | PRN

## 2017-11-04 MED ORDER — PREGABALIN 100 MG PO CAPS
100.0000 mg | ORAL_CAPSULE | Freq: Three times a day (TID) | ORAL | Status: DC
  Administered 2017-11-04 – 2017-11-07 (×11): 100 mg via ORAL
  Filled 2017-11-04 (×7): qty 1
  Filled 2017-11-04: qty 4
  Filled 2017-11-04 (×3): qty 1

## 2017-11-04 MED ORDER — SODIUM CHLORIDE 0.9 % IV SOLN
INTRAVENOUS | Status: AC | PRN
  Administered 2017-11-04: 10 mL/h via INTRAVENOUS

## 2017-11-04 NOTE — ED Provider Notes (Signed)
[facility] EMERGENCY DEPARTMENT Provider Note   CSN: [id number] Arrival date & time: 11/04/17  9211     History   Chief Complaint Chief Complaint  Patient presents with  . Abdominal Pain    HPI [name] is a 62 y.o. male.  HPI  Patient is 62 y.o. male with a history of T2 DM, right BKA, CAD (s/p stenting on ASA and Plavix), diverticulosis of the colon, fatty liver, DDD, s/p right BKA, and heart failure with preserved ejection fraction and abdominal surgical history significant for cholecystectomy and hernia repair presenting for diffuse lower abdominal pain for 2 weeks.  Patient presented to his primary care provider last week, who ordered a CT of the abdomen and pelvis performed 4 days ago.  Patient was called yesterday by his primary care provider and notified that he had a "abscess" found on the CT scan.  Patient reports he has had intermittent worsening of the abdominal pain since seeing his primary care provider.  Patient  reports chills without recorded fever.  Patient denies nausea or vomiting.  Patient reports last bowel movement a days ago and slightly loose but otherwise normal.  No melena or hematochezia.  Patient denies any chest pain or shortness of breath. Patient is on chronic pain regimen including Fentanyl, oxycodone, and breakthrough Dilaudid which has not been controlling abdominal pain completely.  Past Medical History:  Diagnosis Date  . Barrett's esophagus   . Carotid artery occlusion 11/10/10   LEFT CAROTID ENDARTERECTOMY  . CHF (congestive heart failure) ([place])   . Complication of anesthesia    BP WENT UP AT [facility] "  . COPD (chronic obstructive pulmonary disease) ([place])   . Coronary artery disease   . Diabetes mellitus   . Diverticulosis of colon (without mention of hemorrhage)   . DJD (degenerative joint disease)   . Emphysema of lung ([place])   . Fatty liver   . Full dentures   . GERD (gastroesophageal reflux disease)   . H/O Doppler  ultrasound 06/28/2012   Lower venous duplex left-- findings consistent with acute deep vein thrombosis involving the personal vein of the left lower extremity. No evidence of Baker's cyst on the left. Duplex scan of the left lower extremity arteries reveal adequate blood flow.   . H/O hiatal hernia   . History of cardiovascular stress test 03/23/2011   ejection fraction measured 51%, with an end-diastolic volume of 941 ml and an end-systolic volume of 81 ml. no evidence of myocardial ischemia.  Marland Kitchen History of echocardiogram 08/22/2011   Mildly hypertrophic left ventricle with normal systolic function and mild diastolic dysfunction. mildly dilated left atrium. no significant valvular abnormalities. no pericardial effusion no change from previous study 10/2010. the ejection fraction =>55%  . History of Holter monitoring 02/14/2012   Scan quality is fair due to baseline artifact. Predominant rhythm is Sinus. Veentricular Ectopy was noted in single beat form. The total recording time was 24:00 hours. total beats =[id number]. the total time analyzed was 23:58 hours. Heart  Max = 102 at 1:18:22, Min= 52 at 9:56:19 am., Avg= 70. There were 0 pause(s) > 2.5 seconds. the longest pause = 0.00 sec. at 1:51 pm(1)  . Hyperlipidemia   . Hypertension   . Neuromuscular disorder ([place])    peripheral neuropathy  . OSA (obstructive sleep apnea) 12/26/08   AHI of 6.3/hr overall, 32.7/hr during REM sleep and an RDI of 24.8/hr and 50.9/hr.  . Osteomyelitis ([place])   . Osteomyelitis ([place])  left 5th metatarsal  . PAD (peripheral artery disease) ([place])    Distal aortogram June 2012. Atherectomy left popliteal artery July 2012.   . Sleep apnea    has not used CPAP in e years - took self off  . Slurred speech    AS PER WIFE IN D/C NOTE 11/10/10  . Substernal chest pain   . Visual color changes    LEFT EYE; BUT NOT TOTAL BLINDNESS  . Wears glasses     Patient Active Problem List   Diagnosis Date Noted  . Chronic venous  hypertension (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 ([place]) 12/11/2016  . Non-pressure chronic ulcer of other part of left foot limited to breakdown of skin ([place]) 11/12/2016  . Unilateral primary osteoarthritis, left knee 10/11/2016  . Midfoot ulcer, left, limited to breakdown of skin ([place]) 10/11/2016  . Hypertension 07/30/2016  . History of right below knee amputation ([place]) 07/12/2016  . Poorly controlled type 2 diabetes mellitus with peripheral neuropathy ([place]) 05/09/2016  . Pressure ulcer 05/08/2016  . Diabetic foot infection ([place]) 05/04/2016  . Diabetic foot ([place]) 05/04/2016  . Type 2 diabetes mellitus with insulin therapy ([place]) 12/16/2015  . Amputated toe ([place]) 10/21/2015  . Diabetic foot ulcer ([place]) 10/12/2015  . Leg edema, left 09/03/2015  . Acute renal failure ([place]) 09/03/2015  . CAD (dz of distal, mid and proximal RCA with implantation of 3 overlapping drug-eluting stent,) 09/03/2015  . Chronic diastolic heart failure, NYHA class 2 ([place]) 09/03/2015  . Opacity of lung on imaging study 01/14/2015  . Angina pectoris associated with type 2 diabetes mellitus ([place]) 10/28/2014  . Hyperlipidemia 08/25/2014  . Chronic total occlusion of artery of the extremities ([place]) 04/08/2012  . Onychomycosis 02/01/2012  . Diabetes mellitus, insulin dependent (IDDM), controlled ([place]) 11/01/2011  . Occlusion and stenosis of carotid artery without mention of cerebral infarction 06/12/2011  . Aftercare following surgery of the circulatory system, [place] 06/12/2011  . GERD (gastroesophageal reflux disease) 05/08/2011  . Barrett's esophagus without dysplasia 05/08/2011  . PVD (peripheral vascular disease) ([place]) 02/01/2011  . HTN (hypertension) 02/01/2011  . Former tobacco use 02/01/2011    Past Surgical History:  Procedure Laterality Date  . AMPUTATION  11/05/2011   Procedure: AMPUTATION RAY;  Surgeon: Wylene Simmer, MD;  Location: [facility];  Service: Orthopedics;  Laterality: Right;   Amputation of Right 4&5th Toes  . AMPUTATION Left 11/26/2012   Procedure: AMPUTATION RAY;  Surgeon: Wylene Simmer, MD;  Location: [facility];  Service: Orthopedics;  Laterality: Left;  fourth ray amputation  . AMPUTATION Right 08/27/2014   Procedure: Transmetatarsal Amputation;  Surgeon: Newt Minion, MD;  Location: [facility];  Service: Orthopedics;  Laterality: Right;  . AMPUTATION Right 01/14/2015   Procedure: AMPUTATION BELOW KNEE;  Surgeon: Newt Minion, MD;  Location: [facility];  Service: Orthopedics;  Laterality: Right;  . AMPUTATION Left 10/21/2015   Procedure: Left Foot 5th Ray Amputation;  Surgeon: Newt Minion, MD;  Location: [facility];  Service: Orthopedics;  Laterality: Left;  . ANTERIOR FUSION CERVICAL SPINE  02/06/06   C4-5, C5-6, C6-7; SURGEON DR. MAX COHEN  . CARDIAC CATHETERIZATION  10/31/04   2009  . CAROTID ENDARTERECTOMY  11/10/10  . CAROTID ENDARTERECTOMY Left 11/10/2010   Subtotal occlusion of left internal carotid artery with left hemispheric transient ischemic attacks.  . CARPAL TUNNEL RELEASE Right 10/21/2013   Procedure: RIGHT CARPAL TUNNEL RELEASE;  Surgeon: Wynonia Sours, MD;  Location: [facility];  Service: Orthopedics;  Laterality: Right;  . CHOLECYSTECTOMY    . COLONOSCOPY    . ESOPHAGEAL MANOMETRY Bilateral 07/19/2014   Procedure: ESOPHAGEAL MANOMETRY (EM);  Surgeon: Jerene Bears, MD;  Location: [facility];  Service: Gastroenterology;  Laterality: Bilateral;  . FEMORAL ARTERY STENT     x6  . FOOT SURGERY  04/25/2016    EXCISION BASE 5TH METATARSAL AND PARTIAL CUBOID LEFT FOOT  . HERNIA REPAIR     LEFT INGUINAL AND UMBILICAL REPAIRS  . I&D EXTREMITY Left 04/25/2016   Procedure: EXCISION BASE 5TH METATARSAL AND PARTIAL CUBOID LEFT FOOT;  Surgeon: Newt Minion, MD;  Location: [facility];  Service: Orthopedics;  Laterality: Left;  . JOINT REPLACEMENT Right 2001   Total  . LAMINECTOMY     X 3 LUMBAR AND X 2 CERVICAL SPINE OPERATIONS  . LAPAROSCOPIC CHOLECYSTECTOMY W/  CHOLANGIOGRAPHY  11/09/04   SURGEON DR. Luella Cook  . LEFT HEART CATHETERIZATION WITH CORONARY ANGIOGRAM N/A 10/29/2014   Procedure: LEFT HEART CATHETERIZATION WITH CORONARY ANGIOGRAM;  Surgeon: Laverda Page, MD;  Location: [facility] CATH LAB;  Service: Cardiovascular;  Laterality: N/A;  . LOWER EXTREMITY ANGIOGRAM N/A 03/19/2012   Procedure: LOWER EXTREMITY ANGIOGRAM;  Surgeon: Burnell Blanks, MD;  Location: [facility] CATH LAB;  Service: Cardiovascular;  Laterality: N/A;  . NECK SURGERY    . PENILE PROSTHESIS IMPLANT  08/14/05   INFRAPUBIC INSERTION OF INFLATABLE PENILE PROSTHESIS; SURGEON DR. Amalia Hailey  . PERCUTANEOUS CORONARY STENT INTERVENTION (PCI-S) Right 10/29/2014   Procedure: PERCUTANEOUS CORONARY STENT INTERVENTION (PCI-S);  Surgeon: Laverda Page, MD;  Location: [facility] CATH LAB;  Service: Cardiovascular;  Laterality: Right;  . SHOULDER ARTHROSCOPY    . SPINE SURGERY    . TONSILLECTOMY    . TOTAL KNEE ARTHROPLASTY  07/2002   RIGHT KNEE ; SURGEON  DR. [name] ALSO HAD ARTHROSCOPIC RIGHT KNEE IN  10/2001  . ULNAR NERVE TRANSPOSITION Right 10/21/2013   Procedure: RIGHT ELBOW  ULNAR NERVE DECOMPRESSION;  Surgeon: Wynonia Sours, MD;  Location: [facility];  Service: Orthopedics;  Laterality: Right;       Home Medications    Prior to Admission medications   Medication Sig Start Date End Date Taking? Authorizing Provider  ALPRAZolam (XANAX) 0.25 MG tablet Take 0.25 mg by mouth daily. 08/22/15   [provider]  amLODipine-olmesartan (AZOR) 5-20 MG tablet Take 2 tablets by mouth every evening. 08/01/16   Adrian Prows, MD  aspirin EC 81 MG tablet Take 81 mg by mouth daily.    [provider]  atorvastatin (LIPITOR) 40 MG tablet Take 40 mg by mouth daily.    [provider]  carvedilol (COREG) 25 MG tablet Take 1.5 tablets (37.5 mg total) by mouth 2 (two) times daily. 08/01/16   Adrian Prows, MD  clobetasol (TEMOVATE) 0.05 % external solution Apply 1  application topically daily as needed (dry scalp).    [provider]  clopidogrel (PLAVIX) 75 MG tablet Take 75 mg by mouth daily.  03/28/12   [provider]  clotrimazole-betamethasone (LOTRISONE) cream Apply 1 application topically daily as needed (dry skin - beard).     [provider]  collagenase (SANTYL) ointment Apply topically daily. Patient taking differently: Apply 1 application topically daily. TO AFFECTED SITE(S) 09/06/15   Lavina Hamman, MD  fentaNYL (DURAGESIC - DOSED MCG/HR) 50 MCG/HR Place 1 patch (50 mcg total) onto the skin every 3 (three) days. 05/10/16   Orson Eva, MD  fluticasone ([place])  0.05 % cream Apply 1 application topically 2 (two) times daily as needed (dry skin on face).     [provider]  glyBURIDE-metformin (GLUCOVANCE) 2.5-500 MG per tablet Take 2 tablets by mouth 2 (two) times daily with a meal.  03/05/12   [provider]  HYDROmorphone (DILAUDID) 2 MG tablet Take 1 tablet (2 mg total) by mouth at bedtime. Patient taking differently: Take 2 mg by mouth at bedtime as needed (for breakthrough pain).  05/10/16   Orson Eva, MD  insulin glargine (LANTUS) 100 UNIT/ML injection Inject 0.6 mLs (60 Units total) into the skin at bedtime. 05/11/16   Orson Eva, MD  Insulin Glulisine (APIDRA) 100 UNIT/ML Solostar Pen Inject 13 Units into the skin 3 (three) times daily with meals. Patient taking differently: Inject 10 Units into the skin 3 (three) times daily as needed (for CBG).  05/11/16   Orson Eva, MD  isosorbide mononitrate (IMDUR) 120 MG 24 hr tablet Take 120 mg by mouth daily.    [provider]  Linaclotide Rolan Lipa) 145 MCG CAPS capsule Take 1 capsule (145 mcg total) by mouth daily as needed. Patient taking differently: Take 145 mcg by mouth daily as needed (constipation).  01/20/15   Florencia Reasons, MD  Multiple Vitamin (MULTIVITAMIN WITH MINERALS) TABS tablet Take 1 tablet by mouth daily.    [provider]    nitroGLYCERIN (NITRO-BID) 2 % ointment Apply 0.75 inches topically daily as needed for chest pain.     [provider]  nitroGLYCERIN (NITRODUR - DOSED IN MG/24 HR) 0.2 mg/hr patch Place 1 patch (0.2 mg total) onto the skin daily. 01/08/17   Suzan Slick, NP  nitroGLYCERIN (NITROSTAT) 0.4 MG SL tablet Place 1 tablet (0.4 mg total) under the tongue every 5 (five) minutes as needed (as needed for esophageal spasm). Patient taking differently: Place 0.4 mg under the tongue every 5 (five) minutes as needed for chest pain.  08/09/14   Pyrtle, Lajuan Lines, MD  omega-3 acid ethyl esters (LOVAZA) 1 G capsule Take 2 g by mouth 2 (two) times daily.    [provider]  ondansetron (ZOFRAN-ODT) 4 MG disintegrating tablet Take 4 mg by mouth 4 (four) times daily as needed for nausea.    [provider]  oxyCODONE-acetaminophen (PERCOCET/ROXICET) 5-325 MG tablet Take 1 tablet by mouth every 8 (eight) hours as needed. Patient taking differently: Take 1 tablet by mouth 3 (three) times daily.  05/10/16   Orson Eva, MD  pantoprazole (PROTONIX) 40 MG tablet Take 1 tablet (40 mg total) by mouth daily before breakfast. 10/30/14   Adrian Prows, MD  pregabalin (LYRICA) 100 MG capsule Take 1 capsule (100 mg total) by mouth 3 (three) times daily. 05/10/16   Orson Eva, MD  Probiotic CAPS Take 2 capsules by mouth daily after breakfast.    [provider]  Propylene Glycol (SYSTANE BALANCE OP) Place 2-3 drops into both eyes daily as needed (for dry eyes).    [provider]  rOPINIRole (REQUIP) 2 MG tablet Take 2 mg by mouth at bedtime.     [provider]  Tamsulosin HCl (FLOMAX) 0.4 MG CAPS Take 0.4 mg by mouth daily.    [provider]  torsemide (DEMADEX) 20 MG tablet Take 20 mg by mouth every morning. MAY TAKE AN EXTRA 20 MG ONCE A DAY FOR EXCESSIVE FLUID RETENTION    [provider]    Family History Family History  Problem Relation Age of Onset  .  Heart  disease Father        Before age 47-  CAD, BPG  . Diabetes Father        Amputation  . Cancer Father        PROSTATE  . Hyperlipidemia Father   . Hypertension Father   . Heart attack Father        Triple BPG  . Varicose Veins Father   . Colon cancer Brother   . Cancer Brother        Colon  . Diabetes Brother   . Heart disease Brother 38       A-Fib. Before age 21  . Hyperlipidemia Brother   . Hypertension Brother   . Cancer Sister        Breast  . Hyperlipidemia Sister   . Hypertension Sister   . Hypertension Son   . Arthritis Unknown        GRANDMOTHER  . Hypertension Unknown        OTHER FAMILY MEMBERS    Social History Social History   Tobacco Use  . Smoking status: Current Every Day Smoker    Packs/day: 0.50    Years: 35.00    Pack years: 17.50    Types: E-cigarettes    Last attempt to quit: 10/28/2011    Years since quitting: 6.0  . Smokeless tobacco: Never Used  . Tobacco comment: VAPOR CIGARETTES (Water only)  Substance Use Topics  . Alcohol use: No    Alcohol/week: 0.0 oz    Comment: "not in a long time"  . Drug use: No     Allergies   Ivp dye [iodinated diagnostic agents] and Adhesive [tape]   Review of Systems Review of Systems  Constitutional: Positive for chills. Negative for fever.  HENT: Negative for congestion and rhinorrhea.   Respiratory: Negative for chest tightness and shortness of breath.   Cardiovascular: Positive for leg swelling. Negative for chest pain.       Leg swelling at baseline per patient.  Gastrointestinal: Positive for abdominal distention and abdominal pain. Negative for blood in stool, diarrhea and vomiting.  Genitourinary: Negative for difficulty urinating, dysuria, scrotal swelling and testicular pain.  Musculoskeletal: Negative for arthralgias.  Skin: Negative for color change.  All other systems reviewed and are negative.    Physical Exam Updated Vital Signs BP (!) 225/96   Pulse 75   Temp 99.1 F (37.3  C) (Oral)   Resp 16   SpO2 100%   Physical Exam  Constitutional: He appears well-developed and well-nourished. No distress.  HENT:  Head: Normocephalic and atraumatic.  Mouth/Throat: Oropharynx is clear and moist.  Eyes: Conjunctivae and EOM are normal. Pupils are equal, round, and reactive to light.  Neck: Normal range of motion. Neck supple.  Cardiovascular: Normal rate, regular rhythm, S1 normal and S2 normal.  No murmur heard. Pulmonary/Chest: Effort normal and breath sounds normal. He has no wheezes. He has no rales.  Coarse crackles in right lung base.  Abdominal: Soft. Bowel sounds are normal. He exhibits no distension. There is tenderness in the right lower quadrant, suprapubic area and left lower quadrant. There is no guarding.  Tenderness in both left lower quadrant and right lower quadrant without rebound. Fentanyl patch in place in the left upper quadrant.  Musculoskeletal: Normal range of motion. He exhibits edema. He exhibits no deformity.  1+ pitting edema of the left lower extremity.  The fourth and fifth toe are surgically removed.  There is no erythema of the last  week left lower extremity. There is a right BKA.  Lymphadenopathy:    He has no cervical adenopathy.  Neurological: He is alert.  Cranial nerves grossly intact. Patient moves extremities symmetrically and with good coordination.  Skin: Skin is warm and dry. No rash noted. No erythema.  Psychiatric: He has a normal mood and affect. His behavior is normal. Judgment and thought content normal.  Nursing note and vitals reviewed.    ED Treatments / Results  Labs (all labs ordered are listed, but only abnormal results are displayed) Labs Reviewed  COMPREHENSIVE METABOLIC PANEL - Abnormal; Notable for the following components:      Result Value   Potassium 3.3 (*)    Glucose, Bld 105 (*)    Calcium 8.5 (*)    Total Protein 6.1 (*)    Albumin 3.0 (*)    ALT 13 (*)    All other components within normal  limits  CBC WITH DIFFERENTIAL/PLATELET - Abnormal; Notable for the following components:   RBC 3.88 (*)    Hemoglobin 10.6 (*)    HCT 31.3 (*)    All other components within normal limits  URINALYSIS, ROUTINE W REFLEX MICROSCOPIC - Abnormal; Notable for the following components:   Color, Urine COLORLESS (*)    Specific Gravity, Urine 1.003 (*)    Glucose, UA 50 (*)    Protein, ur 100 (*)    Squamous Epithelial / LPF 0-5 (*)    All other components within normal limits  I-STAT CG4 LACTIC ACID, ED - Abnormal; Notable for the following components:   Lactic Acid, Venous 1.92 (*)    All other components within normal limits  CULTURE, BLOOD (ROUTINE X 2)  CULTURE, BLOOD (ROUTINE X 2)  I-STAT CG4 LACTIC ACID, ED    EKG  EKG Interpretation  Date/Time:  Monday November 04 2017 09:09:50 EST Ventricular Rate:  78 PR Interval:    QRS Duration: 142 QT Interval:  458 QTC Calculation: 522 R Axis:   -66 Text Interpretation:  Sinus rhythm Prolonged PR interval Right bundle branch block LVH with IVCD and secondary repol abnrm Prolonged QT interval No significant change since last tracing Confirmed by Wandra Arthurs [phone]) on 11/04/2017 9:12:14 AM       Radiology No results found.  Procedures Procedures (including critical care time)  Medications Ordered in ED Medications  sodium chloride 0.9 % bolus 1,000 mL (not administered)  piperacillin-tazobactam (ZOSYN) IVPB 3.375 g (not administered)     Initial Impression / Assessment and Plan / ED Course  I have reviewed the triage vital signs and the nursing notes.  Pertinent labs & imaging results that were available during my care of the patient were reviewed by me and considered in my medical decision making (see chart for details).     Patient is nontoxic-appearing, afebrile, and in no acute distress on examination.  Patient exhibits no leukocytosis.  Lactate is elevated at 1.92.  Will repeat after fluid bolus.  Will initiate Zosyn  as broad-spectrum antibiotic.  CT abdomen and pelvis from 10-31-2017 demonstrates confirmatory process in the right pelvis with a 5.4 x 4 point centimeter abscess.  No clear inflammatory processes.  Differential diagnosis listing inflammatory phlegmon concerning for either abscess related to ruptured appendicitis or diverticulitis.  Will consult general surgery and interventional radiology regarding this finding.  Patient noted to be significantly hypertensive. Will refrain from antihypertensives at this time given the elevated lactic, and attempt to achieve BP control with analgesia.  7:57 AM  spoke with Hunt Oris of general surgery, who recommends medical admission.  8:06 AM Spoke with Dr. Willy Eddy of IR who would like general IR consult placed and IR PA to see patient as consult.  Dyanne Carrel, nurse practitioner of [facility] hospitalist to admit the patient to medical service.   This is a shared visit with Dr. Shirlyn Goltz. Patient was independently evaluated by this attending physician. Attending physician consulted in evaluation and admission.   Final Clinical Impressions(s) / ED Diagnoses   Final diagnoses:  Intra-abdominal abscess ([place])  Elevated lactic acid level    ED Discharge Orders    None       Tamala Julian 11/04/17 1021    Drenda Freeze, MD 11/05/17 [phone]

## 2017-11-04 NOTE — ED Notes (Signed)
Patient transported to [facility] radiology

## 2017-11-04 NOTE — ED Notes (Signed)
Admitting paged for hypoglycemia

## 2017-11-04 NOTE — Progress Notes (Signed)
Pharmacy Antibiotic Note  [name] is a 62 y.o. male admitted on 11/04/2017 with intra-abdominal infection.  Pharmacy has been consulted for zosyn dosing. CrCl~80-85.  Plan: Zosyn 3.375g IV (10min inf) x1; then 3.375g IV q8h (4h inf) Monitor clinical progress, c/s, renal function F/u de-escalation plan/LOT Rx will s/o consult    Temp (24hrs), Avg:99.1 F (37.3 C), Min:99.1 F (37.3 C), Max:99.1 F (37.3 C)  Recent Labs  Lab 11/04/17 0541 11/04/17 0609 11/04/17 0758  WBC 6.6  --   --   CREATININE 1.20  --   --   LATICACIDVEN  --  1.92* 1.53    Estimated Creatinine Clearance: 84.4 mL/min (by C-G formula based on SCr of 1.2 mg/dL).    Allergies  Allergen Reactions  . Ivp Dye [Iodinated Diagnostic Agents] Anaphylaxis and Other (See Comments)    Breathing problems   . Adhesive [Tape] Rash    Rash after 2-3 days use   Elicia Lamp, PharmD, BCPS Clinical Pharmacist Clinical phone for 11/04/2017 until 3:30pm: [phone] If after 3:30pm, please call main pharmacy at: [phone] 11/04/2017 8:35 AM

## 2017-11-04 NOTE — Progress Notes (Addendum)
Chief Complaint: Patient was seen in consultation today for abdominal abscess at the request of Dr. Romana Juniper  Referring Physician(s): Dr. Kae Heller  Supervising Physician: Corrie Mckusick  Patient Status: [facility] - In-pt  History of Present Illness: [name] is a 62 y.o. male being admitted with intra-abdominal abscess, likely source diverticulitis. Had CT scan 4 days ago and now being admitted through ED. Surgical team has consulted and requests perc drain by IR. Chart, meds, labs, imaging, allergies reviewed. Has been NPO  Past Medical History:  Diagnosis Date  . Barrett's esophagus   . Carotid artery occlusion 11/10/10   LEFT CAROTID ENDARTERECTOMY  . CHF (congestive heart failure) ([place])   . Complication of anesthesia    BP WENT UP AT [facility] "  . COPD (chronic obstructive pulmonary disease) ([place])   . Coronary artery disease   . Diabetes mellitus   . Diverticulosis of colon (without mention of hemorrhage)   . DJD (degenerative joint disease)   . Emphysema of lung ([place])   . Fatty liver   . Full dentures   . GERD (gastroesophageal reflux disease)   . H/O Doppler ultrasound 06/28/2012   Lower venous duplex left-- findings consistent with acute deep vein thrombosis involving the personal vein of the left lower extremity. No evidence of Baker's cyst on the left. Duplex scan of the left lower extremity arteries reveal adequate blood flow.   . H/O hiatal hernia   . History of cardiovascular stress test 03/23/2011   ejection fraction measured 51%, with an end-diastolic volume of 932 ml and an end-systolic volume of 81 ml. no evidence of myocardial ischemia.  Marland Kitchen History of echocardiogram 08/22/2011   Mildly hypertrophic left ventricle with normal systolic function and mild diastolic dysfunction. mildly dilated left atrium. no significant valvular abnormalities. no pericardial effusion no change from previous study 10/2010. the ejection fraction =>55%  . History of Holter  monitoring 02/14/2012   Scan quality is fair due to baseline artifact. Predominant rhythm is Sinus. Veentricular Ectopy was noted in single beat form. The total recording time was 24:00 hours. total beats =[id number]. the total time analyzed was 23:58 hours. Heart  Max = 102 at 1:18:22, Min= 52 at 9:56:19 am., Avg= 70. There were 0 pause(s) > 2.5 seconds. the longest pause = 0.00 sec. at 1:51 pm(1)  . Hyperlipidemia   . Hypertension   . Neuromuscular disorder ([place])    peripheral neuropathy  . OSA (obstructive sleep apnea) 12/26/08   AHI of 6.3/hr overall, 32.7/hr during REM sleep and an RDI of 24.8/hr and 50.9/hr.  . Osteomyelitis ([place])   . Osteomyelitis ([place])    left 5th metatarsal  . PAD (peripheral artery disease) ([place])    Distal aortogram June 2012. Atherectomy left popliteal artery July 2012.   . Sleep apnea    has not used CPAP in e years - took self off  . Slurred speech    AS PER WIFE IN D/C NOTE 11/10/10  . Substernal chest pain   . Visual color changes    LEFT EYE; BUT NOT TOTAL BLINDNESS  . Wears glasses     Past Surgical History:  Procedure Laterality Date  . AMPUTATION  11/05/2011   Procedure: AMPUTATION RAY;  Surgeon: Wylene Simmer, MD;  Location: [place];  Service: Orthopedics;  Laterality: Right;  Amputation of Right 4&5th Toes  . AMPUTATION Left 11/26/2012   Procedure: AMPUTATION RAY;  Surgeon: Wylene Simmer, MD;  Location: [place];  Service: Orthopedics;  Laterality: Left;  fourth ray amputation  . AMPUTATION Right 08/27/2014   Procedure: Transmetatarsal Amputation;  Surgeon: Newt Minion, MD;  Location: [place];  Service: Orthopedics;  Laterality: Right;  . AMPUTATION Right 01/14/2015   Procedure: AMPUTATION BELOW KNEE;  Surgeon: Newt Minion, MD;  Location: [place];  Service: Orthopedics;  Laterality: Right;  . AMPUTATION Left 10/21/2015   Procedure: Left Foot 5th Ray Amputation;  Surgeon: Newt Minion, MD;  Location: [place];  Service: Orthopedics;  Laterality: Left;  . ANTERIOR FUSION  CERVICAL SPINE  02/06/06   C4-5, C5-6, C6-7; SURGEON DR. MAX COHEN  . CARDIAC CATHETERIZATION  10/31/04   2009  . CAROTID ENDARTERECTOMY  11/10/10  . CAROTID ENDARTERECTOMY Left 11/10/2010   Subtotal occlusion of left internal carotid artery with left hemispheric transient ischemic attacks.  . CARPAL TUNNEL RELEASE Right 10/21/2013   Procedure: RIGHT CARPAL TUNNEL RELEASE;  Surgeon: Wynonia Sours, MD;  Location: [place];  Service: Orthopedics;  Laterality: Right;  . CHOLECYSTECTOMY    . COLONOSCOPY    . ESOPHAGEAL MANOMETRY Bilateral 07/19/2014   Procedure: ESOPHAGEAL MANOMETRY (EM);  Surgeon: Jerene Bears, MD;  Location: [facility];  Service: Gastroenterology;  Laterality: Bilateral;  . FEMORAL ARTERY STENT     x6  . FOOT SURGERY  04/25/2016    EXCISION BASE 5TH METATARSAL AND PARTIAL CUBOID LEFT FOOT  . HERNIA REPAIR     LEFT INGUINAL AND UMBILICAL REPAIRS  . I&D EXTREMITY Left 04/25/2016   Procedure: EXCISION BASE 5TH METATARSAL AND PARTIAL CUBOID LEFT FOOT;  Surgeon: Newt Minion, MD;  Location: [place];  Service: Orthopedics;  Laterality: Left;  . JOINT REPLACEMENT Right 2001   Total  . LAMINECTOMY     X 3 LUMBAR AND X 2 CERVICAL SPINE OPERATIONS  . LAPAROSCOPIC CHOLECYSTECTOMY W/ CHOLANGIOGRAPHY  11/09/04   SURGEON DR. Luella Cook  . LEFT HEART CATHETERIZATION WITH CORONARY ANGIOGRAM N/A 10/29/2014   Procedure: LEFT HEART CATHETERIZATION WITH CORONARY ANGIOGRAM;  Surgeon: Laverda Page, MD;  Location: [facility] CATH LAB;  Service: Cardiovascular;  Laterality: N/A;  . LOWER EXTREMITY ANGIOGRAM N/A 03/19/2012   Procedure: LOWER EXTREMITY ANGIOGRAM;  Surgeon: Burnell Blanks, MD;  Location: [facility] CATH LAB;  Service: Cardiovascular;  Laterality: N/A;  . NECK SURGERY    . PENILE PROSTHESIS IMPLANT  08/14/05   INFRAPUBIC INSERTION OF INFLATABLE PENILE PROSTHESIS; SURGEON DR. Amalia Hailey  . PERCUTANEOUS CORONARY STENT INTERVENTION (PCI-S) Right 10/29/2014   Procedure: PERCUTANEOUS  CORONARY STENT INTERVENTION (PCI-S);  Surgeon: Laverda Page, MD;  Location: [facility] C[facility] LAB;  Service: Cardiovascular;  Laterality: Right;  . SHOULDER ARTHROSCOPY    . SPINE SURGERY    . TONSILLECTOMY    . TOTAL KNEE ARTHROPLASTY  07/2002   RIGHT KNEE ; SURGEON  DR. [name] ALSO HAD ARTHROSCOPIC RIGHT KNEE IN  10/2001  . ULNAR NERVE TRANSPOSITION Right 10/21/2013   Procedure: RIGHT ELBOW  ULNAR NERVE DECOMPRESSION;  Surgeon: Wynonia Sours, MD;  Location: [place];  Service: Orthopedics;  Laterality: Right;    Allergies: Ivp dye [iodinated diagnostic agents] and Adhesive [tape]  Medications: Prior to Admission medications   Medication Sig Start Date End Date Taking? Authorizing Provider  amLODipine-olmesartan (AZOR) 5-20 MG tablet Take 2 tablets by mouth every evening. 08/01/16   Adrian Prows, MD  aspirin EC 81 MG tablet Take 81 mg by mouth daily.    [provider]  atorvastatin (LIPITOR) 40 MG tablet Take 40 mg by  mouth daily.    [provider]  carvedilol (COREG) 25 MG tablet Take 1.5 tablets (37.5 mg total) by mouth 2 (two) times daily. 08/01/16   Adrian Prows, MD  clobetasol (TEMOVATE) 0.05 % external solution Apply 1 application topically daily as needed (dry scalp).    [provider]  clopidogrel (PLAVIX) 75 MG tablet Take 75 mg by mouth daily.  03/28/12   [provider]  clotrimazole-betamethasone (LOTRISONE) cream Apply 1 application topically daily as needed (dry skin - beard).     [provider]  collagenase (SANTYL) ointment Apply topically daily. Patient taking differently: Apply 1 application topically daily. TO AFFECTED SITE(S) 09/06/15   Lavina Hamman, MD  fentaNYL (DURAGESIC - DOSED MCG/HR) 50 MCG/HR Place 1 patch (50 mcg total) onto the skin every 3 (three) days. 05/10/16   Orson Eva, MD  fluticasone (CUTIVATE) 0.05 % cream Apply 1 application topically 2 (two) times daily as needed (dry skin on face).      [provider]  glyBURIDE-metformin (GLUCOVANCE) 2.5-500 MG per tablet Take 2 tablets by mouth 2 (two) times daily with a meal.  03/05/12   [provider]  HYDROmorphone (DILAUDID) 2 MG tablet Take 1 tablet (2 mg total) by mouth at bedtime. Patient taking differently: Take 2 mg by mouth at bedtime as needed (for breakthrough pain).  05/10/16   Orson Eva, MD  insulin glargine (LANTUS) 100 UNIT/ML injection Inject 0.6 mLs (60 Units total) into the skin at bedtime. 05/11/16   Orson Eva, MD  Insulin Glulisine (APIDRA) 100 UNIT/ML Solostar Pen Inject 13 Units into the skin 3 (three) times daily with meals. Patient taking differently: Inject 10 Units into the skin 3 (three) times daily as needed (for CBG).  05/11/16   Orson Eva, MD  isosorbide mononitrate (IMDUR) 120 MG 24 hr tablet Take 120 mg by mouth daily.    [provider]  Linaclotide Rolan Lipa) 145 MCG CAPS capsule Take 1 capsule (145 mcg total) by mouth daily as needed. Patient taking differently: Take 145 mcg by mouth daily as needed (constipation).  01/20/15   Florencia Reasons, MD  Multiple Vitamin (MULTIVITAMIN WITH MINERALS) TABS tablet Take 1 tablet by mouth daily.    [provider]  nitroGLYCERIN (NITRO-BID) 2 % ointment Apply 0.75 inches topically daily as needed for chest pain.     [provider]  nitroGLYCERIN (NITRODUR - DOSED IN MG/24 HR) 0.2 mg/hr patch Place 1 patch (0.2 mg total) onto the skin daily. 01/08/17   Suzan Slick, NP  nitroGLYCERIN (NITROSTAT) 0.4 MG SL tablet Place 1 tablet (0.4 mg total) under the tongue every 5 (five) minutes as needed (as needed for esophageal spasm). Patient taking differently: Place 0.4 mg under the tongue every 5 (five) minutes as needed for chest pain.  08/09/14   Pyrtle, Lajuan Lines, MD  omega-3 acid ethyl esters (LOVAZA) 1 G capsule Take 2 g by mouth 2 (two) times daily.    [provider]  ondansetron (ZOFRAN-ODT) 4 MG disintegrating tablet Take 4 mg by  mouth 4 (four) times daily as needed for nausea.    [provider]  oxyCODONE-acetaminophen (PERCOCET/ROXICET) 5-325 MG tablet Take 1 tablet by mouth every 8 (eight) hours as needed. Patient taking differently: Take 1 tablet by mouth 3 (three) times daily.  05/10/16   Orson Eva, MD  pantoprazole (PROTONIX) 40 MG tablet Take 1 tablet (40 mg total) by mouth daily before breakfast. 10/30/14   Adrian Prows, MD  pregabalin (LYRICA) 100 MG capsule Take 1 capsule (100 mg total) by mouth 3 (three) times daily. 05/10/16   Orson Eva, MD  Probiotic CAPS Take 2 capsules by mouth daily after breakfast.    [provider]  Propylene Glycol (SYSTANE BALANCE OP) Place 2-3 drops into both eyes daily as needed (for dry eyes).    [provider]  rOPINIRole (REQUIP) 2 MG tablet Take 2 mg by mouth at bedtime.     [provider]  Tamsulosin HCl (FLOMAX) 0.4 MG CAPS Take 0.4 mg by mouth daily.    [provider]  torsemide (DEMADEX) 20 MG tablet Take 20 mg by mouth every morning. MAY TAKE AN EXTRA 20 MG ONCE A DAY FOR EXCESSIVE FLUID RETENTION    [provider]     Family History  Problem Relation Age of Onset  . Heart disease Father        Before age 19-  CAD, BPG  . Diabetes Father        Amputation  . Cancer Father        PROSTATE  . Hyperlipidemia Father   . Hypertension Father   . Heart attack Father        Triple BPG  . Varicose Veins Father   . Colon cancer Brother   . Cancer Brother        Colon  . Diabetes Brother   . Heart disease Brother 28       A-Fib. Before age 20  . Hyperlipidemia Brother   . Hypertension Brother   . Cancer Sister        Breast  . Hyperlipidemia Sister   . Hypertension Sister   . Hypertension Son   . Arthritis Unknown        GRANDMOTHER  . Hypertension Unknown        OTHER FAMILY MEMBERS    Social History   Socioeconomic History  . Marital status: Married    Spouse name: None  . Number of children: None    . Years of education: None  . Highest education level: None  Social Needs  . Financial resource strain: None  . Food insecurity - worry: None  . Food insecurity - inability: None  . Transportation needs - medical: None  . Transportation needs - non-medical: None  Occupational History  . Occupation: Magazine features editor: UNEMPLOYED  Tobacco Use  . Smoking status: Current Every Day Smoker    Packs/day: 0.50    Years: 35.00    Pack years: 17.50    Types: E-cigarettes    Last attempt to quit: 10/28/2011    Years since quitting: 6.0  . Smokeless tobacco: Never Used  . Tobacco comment: VAPOR CIGARETTES (Water only)  Substance and Sexual Activity  . Alcohol use: No    Alcohol/week: 0.0 oz    Comment: "not in a long time"  . Drug use: No  . Sexual activity: Yes    Birth control/protection: Implant  Other Topics Concern  . None  Social History Narrative   HEAVY SMOKER AND CONTINUES TO SMOKE 1 PPD. DOES NOT EXERCISE REGULARLY.    Wife [phone] Lebron Quam). Has 2 sons and daughter. Still active             Review of Systems: A 12 point ROS discussed and pertinent positives are indicated in the HPI above.  All other systems are negative.  Review of Systems  Vital Signs: BP (!) 225/96   Pulse  75   Temp 99.1 F (37.3 C) (Oral)   Resp 16   SpO2 100%   Physical Exam  Constitutional: He is oriented to person, place, and time. He appears well-developed. No distress.  HENT:  Head: Normocephalic.  Mouth/Throat: Oropharynx is clear and moist.  Neck: Normal range of motion. No JVD present. No tracheal deviation present.  Cardiovascular: Normal rate, regular rhythm and normal heart sounds.  Pulmonary/Chest: Effort normal and breath sounds normal. No respiratory distress.  Abdominal: Soft. He exhibits no distension. There is tenderness. There is no guarding.  Neurological: He is alert and oriented to person, place, and time.  Psychiatric: He has a normal mood and  affect.      Imaging: Ct Abdomen Pelvis Wo Contrast  Result Date: 10/31/2017 CLINICAL DATA:  Abdominal pain and swelling for 4 weeks. EXAM: CT ABDOMEN AND PELVIS WITHOUT CONTRAST TECHNIQUE: Multidetector CT imaging of the abdomen and pelvis was performed following the standard protocol without IV contrast. COMPARISON:  None. FINDINGS: Lower chest: The lung bases are clear. No pleural effusion or worrisome pulmonary lesions. The heart is normal in size. Coronary artery calcifications are noted. The distal esophagus is grossly normal. Hepatobiliary: No focal hepatic lesions or intrahepatic biliary dilatation. The gallbladder is surgically absent. Mild associated common bile duct dilatation. Pancreas: No mass, inflammation or ductal dilatation. Spleen: Normal size.  No focal lesions. Adrenals/Urinary Tract: The adrenal glands and kidneys are unremarkable. Small bilateral renal calculi but no obstructing ureteral calculi or bladder calculi. Stomach/Bowel: The stomach, duodenum and small bowel are unremarkable. No acute inflammatory changes, mass lesions or obstructive findings. There is an inflammatory process in the right pelvis with a 5.4 x 4.7 cm abscess. This is located between the cecal tip/terminal ileum and the sigmoid colon. Although this could be ruptured appendicitis I think it is more likely a diverticular abscess extending off of the right wall of the sigmoid colon. There is underlying sigmoid diverticulosis and on the coronal images there appears to be some air connecting the abscess with the colon. Significant surrounding inflammatory phlegmon involving the dome region of the bladder. Vascular/Lymphatic: Moderate atherosclerotic calcifications involving the aorta and iliac arteries. Scattered borderline mesenteric and retroperitoneal lymph nodes likely inflammatory/hyperplastic. No pelvic adenopathy. Borderline enlarged inguinal lymph nodes. Reproductive: The prostate gland and seminal vesicles are  grossly normal. A penile prosthesis is noted. Other: No abdominal wall hernia or subcutaneous lesions. No ascites. Musculoskeletal: No significant bony findings. Degenerative changes involving the spine and spinal fusion hardware noted. IMPRESSION: 1. 5 cm right-sided pelvic abscess most likely related to sigmoid diverticulitis. It is possible this is ruptured appendicitis if the patient still has his appendix. I think that is less likely. There is significant surrounding inflammatory phlegmon involving the terminal ileum and also the dome of the bladder. 2. Small bilateral renal calculi but no obstructing ureteral calculi or bladder calculi. 3. Status post cholecystectomy.  No biliary dilatation. These results will be called to the ordering clinician or representative by the Radiologist Assistant, and communication documented in the PACS or zVision Dashboard. Electronically Signed   By: Marijo Sanes M.D.   On: 10/31/2017 16:41    Labs:  CBC: Recent Labs    11/04/17 0541  WBC 6.6  HGB 10.6*  HCT 31.3*  PLT 265    COAGS: No results for input(s): INR, APTT in the last 8760 hours.  BMP: Recent Labs    11/04/17 0541  NA 139  K 3.3*  CL 106  CO2 24  GLUCOSE 105*  BUN 7  CALCIUM 8.5*  CREATININE 1.20  GFRNONAA >60  GFRAA >60    LIVER FUNCTION TESTS: Recent Labs    11/04/17 0541  BILITOT 0.5  AST 26  ALT 13*  ALKPHOS 106  PROT 6.1*  ALBUMIN 3.0*    TUMOR MARKERS: No results for input(s): AFPTM, CEA, CA199, CHROMGRNA in the last 8760 hours.  Assessment and Plan: Abdominal abscess Diverticulitis For CT guided drainage Imaging reviewed with Dr. Earleen Newport Labs ok Risks and benefits discussed with the patient including bleeding, infection, damage to adjacent structures, bowel perforation/fistula connection, and sepsis.  All of the patient's questions were answered, patient is agreeable to proceed. Consent signed and in chart.    Thank you for this interesting consult.   I greatly enjoyed meeting [name] and look forward to participating in their care.  A copy of this report was sent to the requesting provider on this date.  Electronically Signed: Ascencion Dike, PA-C 11/04/2017, 9:02 AM   I spent a total of 20 minutes in face to face in clinical consultation, greater than 50% of which was counseling/coordinating care for abdominal abscess drainage

## 2017-11-04 NOTE — Sedation Documentation (Signed)
Patient is resting comfortably. 

## 2017-11-04 NOTE — Progress Notes (Signed)
Notified by central tele that pt had a 3-beat non-sustained run of V-tach at 2331.  On assessment, pt is presently asymptomatic, but did report "having to breath a little harder" around the time the arrhythmia occurred.  Will continue to monitor closely.

## 2017-11-04 NOTE — ED Notes (Signed)
Patient returned from xray, JP drain intact, dressing dry, Patient is alert oriented. Patient given sprite to drink. Family at bedside.

## 2017-11-04 NOTE — ED Notes (Signed)
J peg drain checked an estimated 12.5 mL bloody drainage noted in bulb

## 2017-11-04 NOTE — ED Notes (Signed)
Attempted iv access to left wrist with no success. Dr Darl Householder informed and will do [place] IV

## 2017-11-04 NOTE — Procedures (Signed)
Interventional Radiology Procedure Note  Procedure: CT guided drainage of abd abscess.  70F drain to bulb.  Complications: None Recommendations:  - Routine drain care.  Record output per shift - Do not submerge  - follow up culture.   Signed,  Dulcy Fanny. Earleen Newport, DO

## 2017-11-04 NOTE — ED Notes (Signed)
Admitting at bedside 

## 2017-11-04 NOTE — ED Notes (Signed)
Patient states he is having little pain, JP drain has approx. 20 cc blood. Dressing dry and intact.

## 2017-11-04 NOTE — ED Triage Notes (Signed)
Pt has been having abd pain for the past two weeks. Pain in lower abd then radiating up to epigastric. Pt had a CT at [place] on Wednesday, was told yesterday about CT results. Results per chart show "5cm R sided pelvic abscess...questionable ruptured appendix...inflammatory phlegmon involving terminal ileum and dome of bladder." Denies fevers, has been having hot/cold flashes and nausea, in which he has been taking zofran

## 2017-11-04 NOTE — Consult Note (Signed)
[facility] Surgery Consult Note  [name] [birth year]  [id number].    Requesting MD: Karmen Bongo Chief Complaint/Reason for Consult: pelvic abscess  HPI:  [name] is a 62yo male PMH DM, right BKA, CAD (s/p stenting on plavix), DDD, and heart failure, who presented to [facility] today with 2 weeks of abdominal pain. States that he initially started to feel better then over the last few days the pain got worse. It is mostly in his lower abdomen. Equally painful on the left and right sides. Worse with palpation and PO intake. He reports nausea and chills, no emesis. He also reports some intermittent diarrhea, but states that this is not abnormal for him. States that he has never had pain like this before. Patient was seen by his PCP who ordered a CT scan on 2/21 which showed a 5cm right-sided pelvic abscess most likely related to sigmoid diverticulitis. General surgery asked to consult.   Abdominal surgical history: cholecystectomy, umbilical and inguinal hernia repairs  ROS: Review of Systems  Constitutional: Positive for chills.  HENT: Negative.   Eyes: Negative.   Respiratory: Negative.   Cardiovascular: Negative.   Gastrointestinal: Positive for abdominal pain, diarrhea and nausea. Negative for constipation and vomiting.  Genitourinary: Negative.   Musculoskeletal: Negative.   Skin: Negative.   Neurological: Negative.     All systems reviewed and otherwise negative except for as above  Family History  Problem Relation Age of Onset  . Heart disease Father        Before age 45-  CAD, BPG  . Diabetes Father        Amputation  . Cancer Father        PROSTATE  . Hyperlipidemia Father   . Hypertension Father   . Heart attack Father        Triple BPG  . Varicose Veins Father   . Colon cancer Brother   . Cancer Brother        Colon  . Diabetes Brother   . Heart disease Brother 66       A-Fib. Before age 23  . Hyperlipidemia Brother   . Hypertension Brother    . Cancer Sister        Breast  . Hyperlipidemia Sister   . Hypertension Sister   . Hypertension Son   . Arthritis Unknown        GRANDMOTHER  . Hypertension Unknown        OTHER FAMILY MEMBERS    Past Medical History:  Diagnosis Date  . Barrett's esophagus   . Carotid artery occlusion 11/10/10   LEFT CAROTID ENDARTERECTOMY  . CHF (congestive heart failure) ([place])   . Complication of anesthesia    BP WENT UP AT [facility] "  . COPD (chronic obstructive pulmonary disease) ([place])   . Coronary artery disease   . Diabetes mellitus   . Diverticulosis of colon (without mention of hemorrhage)   . DJD (degenerative joint disease)   . Emphysema of lung ([place])   . Fatty liver   . Full dentures   . GERD (gastroesophageal reflux disease)   . H/O Doppler ultrasound 06/28/2012   Lower venous duplex left-- findings consistent with acute deep vein thrombosis involving the personal vein of the left lower extremity. No evidence of Baker's cyst on the left. Duplex scan of the left lower extremity arteries reveal adequate blood flow.   . H/O hiatal hernia   . History of cardiovascular stress test 03/23/2011   ejection fraction  measured 51%, with an end-diastolic volume of 665 ml and an end-systolic volume of 81 ml. no evidence of myocardial ischemia.  Marland Kitchen History of echocardiogram 08/22/2011   Mildly hypertrophic left ventricle with normal systolic function and mild diastolic dysfunction. mildly dilated left atrium. no significant valvular abnormalities. no pericardial effusion no change from previous study 10/2010. the ejection fraction =>55%  . History of Holter monitoring 02/14/2012   Scan quality is fair due to baseline artifact. Predominant rhythm is Sinus. Veentricular Ectopy was noted in single beat form. The total recording time was 24:00 hours. total beats =104061. the total time analyzed was 23:58 hours. Heart  Max = 102 at 1:18:22, Min= 52 at 9:56:19 am., Avg= 70. There were 0 pause(s) > 2.5 seconds. the  longest pause = 0.00 sec. at 1:51 pm(1)  . Hyperlipidemia   . Hypertension   . Neuromuscular disorder ([place])    peripheral neuropathy  . OSA (obstructive sleep apnea) 12/26/08   AHI of 6.3/hr overall, 32.7/hr during REM sleep and an RDI of 24.8/hr and 50.9/hr.  . Osteomyelitis ([place])   . Osteomyelitis ([place])    left 5th metatarsal  . PAD (peripheral artery disease) ([place])    Distal aortogram June 2012. Atherectomy left popliteal artery July 2012.   . Sleep apnea    has not used CPAP in e years - took self off  . Slurred speech    AS PER WIFE IN D/C NOTE 11/10/10  . Substernal chest pain   . Visual color changes    LEFT EYE; BUT NOT TOTAL BLINDNESS  . Wears glasses     Past Surgical History:  Procedure Laterality Date  . AMPUTATION  11/05/2011   Procedure: AMPUTATION RAY;  Surgeon: Wylene Simmer, MD;  Location: [place];  Service: Orthopedics;  Laterality: Right;  Amputation of Right 4&5th Toes  . AMPUTATION Left 11/26/2012   Procedure: AMPUTATION RAY;  Surgeon: Wylene Simmer, MD;  Location: [place];  Service: Orthopedics;  Laterality: Left;  fourth ray amputation  . AMPUTATION Right 08/27/2014   Procedure: Transmetatarsal Amputation;  Surgeon: Newt Minion, MD;  Location: [place];  Service: Orthopedics;  Laterality: Right;  . AMPUTATION Right 01/14/2015   Procedure: AMPUTATION BELOW KNEE;  Surgeon: Newt Minion, MD;  Location: [place];  Service: Orthopedics;  Laterality: Right;  . AMPUTATION Left 10/21/2015   Procedure: Left Foot 5th Ray Amputation;  Surgeon: Newt Minion, MD;  Location: [place];  Service: Orthopedics;  Laterality: Left;  . ANTERIOR FUSION CERVICAL SPINE  02/06/06   C4-5, C5-6, C6-7; SURGEON DR. MAX COHEN  . CARDIAC CATHETERIZATION  10/31/04   2009  . CAROTID ENDARTERECTOMY  11/10/10  . CAROTID ENDARTERECTOMY Left 11/10/2010   Subtotal occlusion of left internal carotid artery with left hemispheric transient ischemic attacks.  . CARPAL TUNNEL RELEASE Right 10/21/2013   Procedure: RIGHT  CARPAL TUNNEL RELEASE;  Surgeon: Wynonia Sours, MD;  Location: [place];  Service: Orthopedics;  Laterality: Right;  . CHOLECYSTECTOMY    . COLONOSCOPY    . ESOPHAGEAL MANOMETRY Bilateral 07/19/2014   Procedure: ESOPHAGEAL MANOMETRY (EM);  Surgeon: Jerene Bears, MD;  Location: [facility];  Service: Gastroenterology;  Laterality: Bilateral;  . FEMORAL ARTERY STENT     x6  . FOOT SURGERY  04/25/2016    EXCISION BASE 5TH METATARSAL AND PARTIAL CUBOID LEFT FOOT  . HERNIA REPAIR     LEFT INGUINAL AND UMBILICAL REPAIRS  . I&D EXTREMITY Left 04/25/2016   Procedure:  EXCISION BASE 5TH METATARSAL AND PARTIAL CUBOID LEFT FOOT;  Surgeon: Newt Minion, MD;  Location: [place];  Service: Orthopedics;  Laterality: Left;  . JOINT REPLACEMENT Right 2001   Total  . LAMINECTOMY     X 3 LUMBAR AND X 2 CERVICAL SPINE OPERATIONS  . LAPAROSCOPIC CHOLECYSTECTOMY W/ CHOLANGIOGRAPHY  11/09/04   SURGEON DR. Luella Cook  . LEFT HEART CATHETERIZATION WITH CORONARY ANGIOGRAM N/A 10/29/2014   Procedure: LEFT HEART CATHETERIZATION WITH CORONARY ANGIOGRAM;  Surgeon: Laverda Page, MD;  Location: [facility] CATH LAB;  Service: Cardiovascular;  Laterality: N/A;  . LOWER EXTREMITY ANGIOGRAM N/A 03/19/2012   Procedure: LOWER EXTREMITY ANGIOGRAM;  Surgeon: Burnell Blanks, MD;  Location: [facility] LAB;  Service: Cardiovascular;  Laterality: N/A;  . NECK SURGERY    . PENILE PROSTHESIS IMPLANT  08/14/05   INFRAPUBIC INSERTION OF INFLATABLE PENILE PROSTHESIS; SURGEON DR. Amalia Hailey  . PERCUTANEOUS CORONARY STENT INTERVENTION (PCI-S) Right 10/29/2014   Procedure: PERCUTANEOUS CORONARY STENT INTERVENTION (PCI-S);  Surgeon: Laverda Page, MD;  Location: [facility] CATH LAB;  Service: Cardiovascular;  Laterality: Right;  . SHOULDER ARTHROSCOPY    . SPINE SURGERY    . TONSILLECTOMY    . TOTAL KNEE ARTHROPLASTY  07/2002   RIGHT KNEE ; SURGEON  DR. [name] ALSO HAD ARTHROSCOPIC RIGHT KNEE IN  10/2001  . ULNAR NERVE TRANSPOSITION  Right 10/21/2013   Procedure: RIGHT ELBOW  ULNAR NERVE DECOMPRESSION;  Surgeon: Wynonia Sours, MD;  Location: [place];  Service: Orthopedics;  Laterality: Right;    Social History:  reports that he has been smoking e-cigarettes.  He has a 17.50 pack-year smoking history. he has never used smokeless tobacco. He reports that he does not drink alcohol or use drugs.  Allergies:  Allergies  Allergen Reactions  . Ivp Dye [Iodinated Diagnostic Agents] Anaphylaxis and Other (See Comments)    Breathing problems   . Adhesive [Tape] Rash    Rash after 2-3 days use     (Not in a hospital admission)  Prior to Admission medications   Medication Sig Start Date End Date Taking? Authorizing Provider  amLODipine-olmesartan (AZOR) 5-20 MG tablet Take 2 tablets by mouth every evening. 08/01/16  Yes Adrian Prows, MD  aspirin EC 81 MG tablet Take 81 mg by mouth daily.   Yes [provider]  atorvastatin (LIPITOR) 40 MG tablet Take 40 mg by mouth daily.   Yes [provider]  carvedilol (COREG) 25 MG tablet Take 1.5 tablets (37.5 mg total) by mouth 2 (two) times daily. 08/01/16  Yes Adrian Prows, MD  clobetasol (TEMOVATE) 0.05 % external solution Apply 1 application topically daily as needed (dry scalp).   Yes [provider]  clopidogrel (PLAVIX) 75 MG tablet Take 75 mg by mouth daily.  03/28/12  Yes [provider]  clotrimazole-betamethasone (LOTRISONE) cream Apply 1 application topically daily as needed (dry skin - beard).    Yes [provider]  collagenase (SANTYL) ointment Apply topically daily. Patient taking differently: Apply 1 application topically daily. TO AFFECTED SITE(S) 09/06/15  Yes Lavina Hamman, MD  fentaNYL (DURAGESIC - DOSED MCG/HR) 50 MCG/HR Place 1 patch (50 mcg total) onto the skin every 3 (three) days. 05/10/16  Yes Tat, Shanon Brow, MD  fluticasone (CUTIVATE) 0.05 % cream Apply 1 application topically 2 (two) times daily as needed  (dry skin on face).    Yes [provider]  glyBURIDE-metformin (GLUCOVANCE) 2.5-500 MG per tablet Take 2 tablets by mouth  2 (two) times daily with a meal.  03/05/12  Yes [provider]  HYDROmorphone (DILAUDID) 2 MG tablet Take 1 tablet (2 mg total) by mouth at bedtime. Patient taking differently: Take 2 mg by mouth at bedtime as needed (for breakthrough pain).  05/10/16  Yes Tat, Shanon Brow, MD  insulin glargine (LANTUS) 100 UNIT/ML injection Inject 0.6 mLs (60 Units total) into the skin at bedtime. Patient taking differently: Inject 50 Units into the skin at bedtime.  05/11/16  Yes Tat, Shanon Brow, MD  Insulin Glulisine (APIDRA) 100 UNIT/ML Solostar Pen Inject 13 Units into the skin 3 (three) times daily with meals. Patient taking differently: Inject 15-18 Units into the skin 3 (three) times daily as needed (for CBG). Sliding scale 05/11/16  Yes Tat, Shanon Brow, MD  isosorbide mononitrate (IMDUR) 120 MG 24 hr tablet Take 120 mg by mouth daily.   Yes [provider]  Linaclotide (LINZESS) 145 MCG CAPS capsule Take 1 capsule (145 mcg total) by mouth daily as needed. Patient taking differently: Take 145 mcg by mouth daily as needed (constipation).  01/20/15  Yes Florencia Reasons, MD  lisinopril (PRINIVIL,ZESTRIL) 10 MG tablet Take 10 mg by mouth daily.   Yes [provider]  Multiple Vitamin (MULTIVITAMIN WITH MINERALS) TABS tablet Take 1 tablet by mouth daily.   Yes [provider]  nitroGLYCERIN (NITRO-BID) 2 % ointment Apply 0.75 inches topically daily as needed for chest pain.    Yes [provider]  nitroGLYCERIN (NITRODUR - DOSED IN MG/24 HR) 0.2 mg/hr patch Place 1 patch (0.2 mg total) onto the skin daily. 01/08/17  Yes Suzan Slick, NP  nitroGLYCERIN (NITROSTAT) 0.4 MG SL tablet Place 1 tablet (0.4 mg total) under the tongue every 5 (five) minutes as needed (as needed for esophageal spasm). Patient taking differently: Place 0.4 mg under the tongue every 5 (five)  minutes as needed for chest pain.  08/09/14  Yes Pyrtle, Lajuan Lines, MD  omega-3 acid ethyl esters (LOVAZA) 1 G capsule Take 2 g by mouth 2 (two) times daily.   Yes [provider]  ondansetron (ZOFRAN-ODT) 4 MG disintegrating tablet Take 4 mg by mouth 4 (four) times daily as needed for nausea.   Yes [provider]  oxyCODONE-acetaminophen (PERCOCET/ROXICET) 5-325 MG tablet Take 1 tablet by mouth every 8 (eight) hours as needed. Patient taking differently: Take 1 tablet by mouth 2 (two) times daily as needed for moderate pain.  05/10/16  Yes Tat, Shanon Brow, MD  pantoprazole (PROTONIX) 40 MG tablet Take 1 tablet (40 mg total) by mouth daily before breakfast. 10/30/14  Yes Adrian Prows, MD  pregabalin (LYRICA) 100 MG capsule Take 1 capsule (100 mg total) by mouth 3 (three) times daily. 05/10/16  Yes Tat, Shanon Brow, MD  Probiotic CAPS Take 2 capsules by mouth daily after breakfast.   Yes [provider]  Propylene Glycol (SYSTANE BALANCE OP) Place 2-3 drops into both eyes daily as needed (for dry eyes).   Yes [provider]  rOPINIRole (REQUIP) 2 MG tablet Take 2 mg by mouth at bedtime.    Yes [provider]  Tamsulosin HCl (FLOMAX) 0.4 MG CAPS Take 0.4 mg by mouth daily.   Yes [provider]  torsemide (DEMADEX) 20 MG tablet Take 20 mg by mouth every morning. MAY TAKE AN EXTRA 20 MG ONCE A DAY FOR EXCESSIVE FLUID RETENTION   Yes [provider]    Blood pressure (!) 197/97, pulse 67, temperature 99.1 F (37.3 C), temperature source  Oral, resp. rate 20, SpO2 100 %. Physical Exam: General: pleasant, WD/WN white male who is laying in bed in NAD HEENT: head is normocephalic, atraumatic.  Sclera are noninjected.  Pupils equal and round.  Ears and nose without any masses or lesions.  Mouth is pink and moist. Dentition fair Heart: regular, rate, and rhythm.  No obvious murmurs, gallops, or rubs noted.  2+ L DP pulse Lungs: CTAB, no wheezes, rhonchi, or  rales noted.  Respiratory effort nonlabored Abd: soft, distedned, +BS, no masses, hernias, or organomegaly. +TTP R and L lower quadrants with voluntary guarding and no rebound MS: s/p R BKA. Left calf soft and nontender Skin: warm and dry with no masses, lesions, or rashes Psych: A&Ox3 with an appropriate affect. Neuro: cranial nerves grossly intact, extremity CSM intact bilaterally, normal speech  Results for orders placed or performed during the hospital encounter of 11/04/17 (from the past 48 hour(s))  Urinalysis, Routine w reflex microscopic     Status: Abnormal   Collection Time: 11/04/17  5:40 AM  Result Value Ref Range   Color, Urine COLORLESS (A) YELLOW   APPearance CLEAR CLEAR   Specific Gravity, Urine 1.003 (L) 1.005 - 1.030   pH 7.0 5.0 - 8.0   Glucose, UA 50 (A) NEGATIVE mg/dL   Hgb urine dipstick NEGATIVE NEGATIVE   Bilirubin Urine NEGATIVE NEGATIVE   Ketones, ur NEGATIVE NEGATIVE mg/dL   Protein, ur 100 (A) NEGATIVE mg/dL   Nitrite NEGATIVE NEGATIVE   Leukocytes, UA NEGATIVE NEGATIVE   RBC / HPF 0-5 0 - 5 RBC/hpf   WBC, UA 6-30 0 - 5 WBC/hpf   Bacteria, UA NONE SEEN NONE SEEN   Squamous Epithelial / LPF 0-5 (A) NONE SEEN    Comment: Performed at [facility], [address] [place], [place] [address]  Comprehensive metabolic panel     Status: Abnormal   Collection Time: 11/04/17  5:41 AM  Result Value Ref Range   Sodium 139 135 - 145 mmol/L   Potassium 3.3 (L) 3.5 - 5.1 mmol/L   Chloride 106 101 - 111 mmol/L   CO2 24 22 - 32 mmol/L   Glucose, Bld 105 (H) 65 - 99 mg/dL   BUN 7 6 - 20 mg/dL   Creatinine, Ser 1.20 0.61 - 1.24 mg/dL   Calcium 8.5 (L) 8.9 - 10.3 mg/dL   Total Protein 6.1 (L) 6.5 - 8.1 g/dL   Albumin 3.0 (L) 3.5 - 5.0 g/dL   AST 26 15 - 41 U/L   ALT 13 (L) 17 - 63 U/L   Alkaline Phosphatase 106 38 - 126 U/L   Total Bilirubin 0.5 0.3 - 1.2 mg/dL   GFR calc non Af Amer >60 >60 mL/min   GFR calc Af Amer >60 >60 mL/min    Comment: (NOTE) The  eGFR has been calculated using the CKD EPI equation. This calculation has not been validated in all clinical situations. eGFR's persistently <60 mL/min signify possible Chronic Kidney Disease.    Anion gap 9 5 - 15    Comment: Performed at [facility] [address], [place], [place] [address]  CBC with Differential     Status: Abnormal   Collection Time: 11/04/17  5:41 AM  Result Value Ref Range   WBC 6.6 4.0 - 10.5 K/uL   RBC 3.88 (L) 4.22 - 5.81 MIL/uL   Hemoglobin 10.6 (L) 13.0 - 17.0 g/dL   HCT 31.3 (L) 39.0 - 52.0 %   MCV  80.7 78.0 - 100.0 fL   MCH 27.3 26.0 - 34.0 pg   MCHC 33.9 30.0 - 36.0 g/dL   RDW 13.2 11.5 - 15.5 %   Platelets 265 150 - 400 K/uL   Neutrophils Relative % 69 %   Neutro Abs 4.6 1.7 - 7.7 K/uL   Lymphocytes Relative 20 %   Lymphs Abs 1.3 0.7 - 4.0 K/uL   Monocytes Relative 8 %   Monocytes Absolute 0.5 0.1 - 1.0 K/uL   Eosinophils Relative 3 %   Eosinophils Absolute 0.2 0.0 - 0.7 K/uL   Basophils Relative 0 %   Basophils Absolute 0.0 0.0 - 0.1 K/uL    Comment: Performed at [facility] [address] [place], [place] [address]  I-Stat CG4 Lactic Acid, ED     Status: Abnormal   Collection Time: 11/04/17  6:09 AM  Result Value Ref Range   Lactic Acid, Venous 1.92 (H) 0.5 - 1.9 mmol/L  I-Stat CG4 Lactic Acid, ED     Status: None   Collection Time: 11/04/17  7:58 AM  Result Value Ref Range   Lactic Acid, Venous 1.53 0.5 - 1.9 mmol/L   No results found.    Assessment/Plan Past Medical History:  Diagnosis Date  . Barrett's esophagus   . Carotid artery occlusion 11/10/10   LEFT CAROTID ENDARTERECTOMY  . CHF (congestive heart failure) ([place])   . Complication of anesthesia    BP WENT UP AT [facility] "  . COPD (chronic obstructive pulmonary disease) ([place])   . Coronary artery disease   . Diabetes mellitus   . Diverticulosis of colon (without mention of hemorrhage)   . DJD (degenerative joint disease)   . Emphysema of lung ([place])   . Fatty  liver   . Full dentures   . GERD (gastroesophageal reflux disease)   . H/O Doppler ultrasound 06/28/2012   Lower venous duplex left-- findings consistent with acute deep vein thrombosis involving the personal vein of the left lower extremity. No evidence of Baker's cyst on the left. Duplex scan of the left lower extremity arteries reveal adequate blood flow.   . H/O hiatal hernia   . History of cardiovascular stress test 03/23/2011   ejection fraction measured 51%, with an end-diastolic volume of 836 ml and an end-systolic volume of 81 ml. no evidence of myocardial ischemia.  Marland Kitchen History of echocardiogram 08/22/2011   Mildly hypertrophic left ventricle with normal systolic function and mild diastolic dysfunction. mildly dilated left atrium. no significant valvular abnormalities. no pericardial effusion no change from previous study 10/2010. the ejection fraction =>55%  . History of Holter monitoring 02/14/2012   Scan quality is fair due to baseline artifact. Predominant rhythm is Sinus. Veentricular Ectopy was noted in single beat form. The total recording time was 24:00 hours. total beats =104061. the total time analyzed was 23:58 hours. Heart  Max = 102 at 1:18:22, Min= 52 at 9:56:19 am., Avg= 70. There were 0 pause(s) > 2.5 seconds. the longest pause = 0.00 sec. at 1:51 pm(1)  . Hyperlipidemia   . Hypertension   . Neuromuscular disorder ([place])    peripheral neuropathy  . OSA (obstructive sleep apnea) 12/26/08   AHI of 6.3/hr overall, 32.7/hr during REM sleep and an RDI of 24.8/hr and 50.9/hr.  . Osteomyelitis ([place])   . Osteomyelitis ([place])    left 5th metatarsal  . PAD (peripheral artery disease) ([place])    Distal aortogram June 2012. Atherectomy left popliteal artery July 2012.   Marland Kitchen  Sleep apnea    has not used CPAP in e years - took self off  . Slurred speech    AS PER WIFE IN D/C NOTE 11/10/10  . Substernal chest pain   . Visual color changes    LEFT EYE; BUT NOT TOTAL BLINDNESS  . Wears glasses       Pelvic abscess - 2 weeks of lower abdominal pain - prior abdominal surgeries: cholecystectomy, inguinal and hernia repairs - last colonoscopy 2015 showed moderate diverticulosis of the descending and sigmoid colon - CT scan shows 5 cm right-sided pelvic abscess most likely related to sigmoid diverticulitis. It is possible this is ruptured appendicitis if the patient still has his appendix. I think that is less likely. There is significant surrounding inflammatory phlegmon involving the terminal ileum and also the dome of the bladder - WBC 6.6, lactic acid 1.92, TMAX 99.1  ID - zosyn 2/25>> VTE - SCDs FEN - IVF, NPO Foley - none Follow up - TBD  Plan - Admit to medicine for multiple medical problems. Pelvic abscess likely due to diverticulitis. Recommend NPO for bowel rest, IVF, IV antibiotics. Agree with IR consult for consideration of percutaneous drainage. Hold plavix. Will continue to follow.  Wellington Hampshire, [facility] Surgery 11/04/2017, 9:12 AM Pager: [phone] Consults: [phone] Mon-Fri 7:00 am-4:30 pm Sat-Sun 7:00 am-11:30 am

## 2017-11-04 NOTE — ED Notes (Signed)
Ordered dinner tray.  

## 2017-11-04 NOTE — H&P (Addendum)
History and Physical    ESSA [name]:[id number] DOB: March 05, 1956 DOA: 11/04/2017  PCP: Anda Kraft, MD Patient coming from: home  Chief Complaint: abdominal pain  HPI: [name] is a 62 y.o. male with medical history significant of COPD, degenerative joint disease and spine disease, diabetes, hypertension, known CAD status post angioplasty to the right coronary artery as well as status post carotid endarterectomy, severe PAD status post amputation right lower extremity and left toe amputations, chronic pain presents to the emergency department as instructed by PCP after outpatient CT of the abdomen and pelvis reveals concern for abscess. [facility] are asked to admit  Information is obtained from the patient and the chart. He's had intermittent abdominal pain for the last week or so. He states the pain is mostly lower quadrants left greater than right. As the pain as sharp radiating to upper abdominal area. He denies nausea vomiting fever chills diarrhea constipation. He denies chest pain palpitation shortness of breath dysuria hematuria frequency or urgency. States his primary care provider HIM yesterday with results of his CT recommended him coming to the emergency department.   ED Course: Emergency department he's afebrile hemodynamically stable with a blood pressure high end of normal and nontoxic appearing He is not hypoxic. He is provided with 1 L normal saline and Zosyn.  Review of Systems: As per HPI otherwise all other systems reviewed and are negative.   Ambulatory Status: Ambulates with a cane  Past Medical History:  Diagnosis Date  . Barrett's esophagus   . Carotid artery occlusion 11/10/10   LEFT CAROTID ENDARTERECTOMY  . CHF (congestive heart failure) ([place])   . Complication of anesthesia    BP WENT UP AT [facility] "  . COPD (chronic obstructive pulmonary disease) ([place])   . Coronary artery disease   . Diabetes mellitus   . Diverticulosis of colon (without  mention of hemorrhage)   . DJD (degenerative joint disease)   . Emphysema of lung ([place])   . Fatty liver   . Full dentures   . GERD (gastroesophageal reflux disease)   . H/O Doppler ultrasound 06/28/2012   Lower venous duplex left-- findings consistent with acute deep vein thrombosis involving the personal vein of the left lower extremity. No evidence of Baker's cyst on the left. Duplex scan of the left lower extremity arteries reveal adequate blood flow.   . H/O hiatal hernia   . History of cardiovascular stress test 03/23/2011   ejection fraction measured 51%, with an end-diastolic volume of 193 ml and an end-systolic volume of 81 ml. no evidence of myocardial ischemia.  Marland Kitchen History of echocardiogram 08/22/2011   Mildly hypertrophic left ventricle with normal systolic function and mild diastolic dysfunction. mildly dilated left atrium. no significant valvular abnormalities. no pericardial effusion no change from previous study 10/2010. the ejection fraction =>55%  . History of Holter monitoring 02/14/2012   Scan quality is fair due to baseline artifact. Predominant rhythm is Sinus. Veentricular Ectopy was noted in single beat form. The total recording time was 24:00 hours. total beats =[id number]. the total time analyzed was 23:58 hours. Heart  Max = 102 at 1:18:22, Min= 52 at 9:56:19 am., Avg= 70. There were 0 pause(s) > 2.5 seconds. the longest pause = 0.00 sec. at 1:51 pm(1)  . Hyperlipidemia   . Hypertension   . Neuromuscular disorder ([place])    peripheral neuropathy  . OSA (obstructive sleep apnea) 12/26/08   AHI of 6.3/hr overall, 32.7/hr during REM sleep and an  RDI of 24.8/hr and 50.9/hr.  . Osteomyelitis ([place])   . Osteomyelitis ([place])    left 5th metatarsal  . PAD (peripheral artery disease) ([place])    Distal aortogram June 2012. Atherectomy left popliteal artery July 2012.   . Sleep apnea    has not used CPAP in e years - took self off  . Slurred speech    AS PER WIFE IN D/C NOTE 11/10/10  .  Substernal chest pain   . Visual color changes    LEFT EYE; BUT NOT TOTAL BLINDNESS  . Wears glasses     Past Surgical History:  Procedure Laterality Date  . AMPUTATION  11/05/2011   Procedure: AMPUTATION RAY;  Surgeon: Wylene Simmer, MD;  Location: [facility];  Service: Orthopedics;  Laterality: Right;  Amputation of Right 4&5th Toes  . AMPUTATION Left 11/26/2012   Procedure: AMPUTATION RAY;  Surgeon: Wylene Simmer, MD;  Location: [facility];  Service: Orthopedics;  Laterality: Left;  fourth ray amputation  . AMPUTATION Right 08/27/2014   Procedure: Transmetatarsal Amputation;  Surgeon: Newt Minion, MD;  Location: [facility];  Service: Orthopedics;  Laterality: Right;  . AMPUTATION Right 01/14/2015   Procedure: AMPUTATION BELOW KNEE;  Surgeon: Newt Minion, MD;  Location: [facility];  Service: Orthopedics;  Laterality: Right;  . AMPUTATION Left 10/21/2015   Procedure: Left Foot 5th Ray Amputation;  Surgeon: Newt Minion, MD;  Location: [facility];  Service: Orthopedics;  Laterality: Left;  . ANTERIOR FUSION CERVICAL SPINE  02/06/06   C4-5, C5-6, C6-7; SURGEON DR. MAX COHEN  . CARDIAC CATHETERIZATION  10/31/04   2009  . CAROTID ENDARTERECTOMY  11/10/10  . CAROTID ENDARTERECTOMY Left 11/10/2010   Subtotal occlusion of left internal carotid artery with left hemispheric transient ischemic attacks.  . CARPAL TUNNEL RELEASE Right 10/21/2013   Procedure: RIGHT CARPAL TUNNEL RELEASE;  Surgeon: Wynonia Sours, MD;  Location: [facility];  Service: Orthopedics;  Laterality: Right;  . CHOLECYSTECTOMY    . COLONOSCOPY    . ESOPHAGEAL MANOMETRY Bilateral 07/19/2014   Procedure: ESOPHAGEAL MANOMETRY (EM);  Surgeon: Jerene Bears, MD;  Location: [facility];  Service: Gastroenterology;  Laterality: Bilateral;  . FEMORAL ARTERY STENT     x6  . FOOT SURGERY  04/25/2016    EXCISION BASE 5TH METATARSAL AND PARTIAL CUBOID LEFT FOOT  . HERNIA REPAIR     LEFT INGUINAL AND UMBILICAL REPAIRS  . I&D EXTREMITY Left 04/25/2016    Procedure: EXCISION BASE 5TH METATARSAL AND PARTIAL CUBOID LEFT FOOT;  Surgeon: Newt Minion, MD;  Location: [facility];  Service: Orthopedics;  Laterality: Left;  . JOINT REPLACEMENT Right 2001   Total  . LAMINECTOMY     X 3 LUMBAR AND X 2 CERVICAL SPINE OPERATIONS  . LAPAROSCOPIC CHOLECYSTECTOMY W/ CHOLANGIOGRAPHY  11/09/04   SURGEON DR. Luella Cook  . LEFT HEART CATHETERIZATION WITH CORONARY ANGIOGRAM N/A 10/29/2014   Procedure: LEFT HEART CATHETERIZATION WITH CORONARY ANGIOGRAM;  Surgeon: Laverda Page, MD;  Location[facility] C[facility] LAB;  Service: Cardiovascular;  Laterality: N/A;  . LOWER EXTREMITY ANGIOGRAM N/A 03/19/2012   Procedure: LOWER EXTREMITY ANGIOGRAM;  Surgeon: Burnell Blanks, MD;  Location: [facility] CATH LAB;  Service: Cardiovascular;  Laterality: N/A;  . NECK SURGERY    . PENILE PROSTHESIS IMPLANT  08/14/05   INFRAPUBIC INSERTION OF INFLATABLE PENILE PROSTHESIS; SURGEON DR. Amalia Hailey  . PERCUTANEOUS CORONARY STENT INTERVENTION (PCI-S) Right 10/29/2014   Procedure: PERCUTANEOUS CORONARY STENT INTERVENTION (PCI-S);  Surgeon: Cammy Brochure  Carlynn Herald, MD;  Location: [facility] CATH LAB;  Service: Cardiovascular;  Laterality: Right;  . SHOULDER ARTHROSCOPY    . SPINE SURGERY    . TONSILLECTOMY    . TOTAL KNEE ARTHROPLASTY  07/2002   RIGHT KNEE ; SURGEON  DR. [name] ALSO HAD ARTHROSCOPIC RIGHT KNEE IN  10/2001  . ULNAR NERVE TRANSPOSITION Right 10/21/2013   Procedure: RIGHT ELBOW  ULNAR NERVE DECOMPRESSION;  Surgeon: Wynonia Sours, MD;  Location: [facility];  Service: Orthopedics;  Laterality: Right;    Social History   Socioeconomic History  . Marital status: Married    Spouse name: Not on file  . Number of children: Not on file  . Years of education: Not on file  . Highest education level: Not on file  Social Needs  . Financial resource strain: Not on file  . Food insecurity - worry: Not on file  . Food insecurity - inability: Not on file  . Transportation needs - medical: Not on  file  . Transportation needs - non-medical: Not on file  Occupational History  . Occupation: Magazine features editor: UNEMPLOYED  Tobacco Use  . Smoking status: Current Every Day Smoker    Packs/day: 0.50    Years: 35.00    Pack years: 17.50    Types: E-cigarettes    Last attempt to quit: 10/28/2011    Years since quitting: 6.0  . Smokeless tobacco: Never Used  . Tobacco comment: VAPOR CIGARETTES (Water only)  Substance and Sexual Activity  . Alcohol use: No    Alcohol/week: 0.0 oz    Comment: "not in a long time"  . Drug use: No  . Sexual activity: Yes    Birth control/protection: Implant  Other Topics Concern  . Not on file  Social History Narrative   HEAVY SMOKER AND CONTINUES TO SMOKE 1 PPD. DOES NOT EXERCISE REGULARLY.    Wife [phone] Lebron Quam). Has 2 sons and daughter. Still active             Allergies  Allergen Reactions  . Ivp Dye [Iodinated Diagnostic Agents] Anaphylaxis and Other (See Comments)    Breathing problems   . Adhesive [Tape] Rash    Rash after 2-3 days use    Family History  Problem Relation Age of Onset  . Heart disease Father        Before age 70-  CAD, BPG  . Diabetes Father        Amputation  . Cancer Father        PROSTATE  . Hyperlipidemia Father   . Hypertension Father   . Heart attack Father        Triple BPG  . Varicose Veins Father   . Colon cancer Brother   . Cancer Brother        Colon  . Diabetes Brother   . Heart disease Brother 67       A-Fib. Before age 70  . Hyperlipidemia Brother   . Hypertension Brother   . Cancer Sister        Breast  . Hyperlipidemia Sister   . Hypertension Sister   . Hypertension Son   . Arthritis Unknown        GRANDMOTHER  . Hypertension Unknown        OTHER FAMILY MEMBERS    Prior to Admission medications   Medication Sig Start Date End Date Taking? Authorizing Provider  amLODipine-olmesartan (AZOR) 5-20 MG tablet Take 2 tablets by mouth  every evening. 08/01/16   Adrian Prows, MD  aspirin EC 81 MG tablet Take 81 mg by mouth daily.    [provider]  atorvastatin (LIPITOR) 40 MG tablet Take 40 mg by mouth daily.    [provider]  carvedilol (COREG) 25 MG tablet Take 1.5 tablets (37.5 mg total) by mouth 2 (two) times daily. 08/01/16   Adrian Prows, MD  clobetasol (TEMOVATE) 0.05 % external solution Apply 1 application topically daily as needed (dry scalp).    [provider]  clopidogrel (PLAVIX) 75 MG tablet Take 75 mg by mouth daily.  03/28/12   [provider]  clotrimazole-betamethasone (LOTRISONE) cream Apply 1 application topically daily as needed (dry skin - beard).     [provider]  collagenase (SANTYL) ointment Apply topically daily. Patient taking differently: Apply 1 application topically daily. TO AFFECTED SITE(S) 09/06/15   Lavina Hamman, MD  fentaNYL (DURAGESIC - DOSED MCG/HR) 50 MCG/HR Place 1 patch (50 mcg total) onto the skin every 3 (three) days. 05/10/16   Orson Eva, MD  fluticasone (CUTIVATE) 0.05 % cream Apply 1 application topically 2 (two) times daily as needed (dry skin on face).     [provider]  glyBURIDE-metformin (GLUCOVANCE) 2.5-500 MG per tablet Take 2 tablets by mouth 2 (two) times daily with a meal.  03/05/12   [provider]  HYDROmorphone (DILAUDID) 2 MG tablet Take 1 tablet (2 mg total) by mouth at bedtime. Patient taking differently: Take 2 mg by mouth at bedtime as needed (for breakthrough pain).  05/10/16   Orson Eva, MD  insulin glargine (LANTUS) 100 UNIT/ML injection Inject 0.6 mLs (60 Units total) into the skin at bedtime. 05/11/16   Orson Eva, MD  Insulin Glulisine (APIDRA) 100 UNIT/ML Solostar Pen Inject 13 Units into the skin 3 (three) times daily with meals. Patient taking differently: Inject 10 Units into the skin 3 (three) times daily as needed (for CBG).  05/11/16   Orson Eva, MD  isosorbide mononitrate (IMDUR) 120 MG 24 hr tablet Take 120 mg by mouth  daily.    [provider]  Linaclotide Rolan Lipa) 145 MCG CAPS capsule Take 1 capsule (145 mcg total) by mouth daily as needed. Patient taking differently: Take 145 mcg by mouth daily as needed (constipation).  01/20/15   Florencia Reasons, MD  Multiple Vitamin (MULTIVITAMIN WITH MINERALS) TABS tablet Take 1 tablet by mouth daily.    [provider]  nitroGLYCERIN (NITRO-BID) 2 % ointment Apply 0.75 inches topically daily as needed for chest pain.     [provider]  nitroGLYCERIN (NITRODUR - DOSED IN MG/24 HR) 0.2 mg/hr patch Place 1 patch (0.2 mg total) onto the skin daily. 01/08/17   Suzan Slick, NP  nitroGLYCERIN (NITROSTAT) 0.4 MG SL tablet Place 1 tablet (0.4 mg total) under the tongue every 5 (five) minutes as needed (as needed for esophageal spasm). Patient taking differently: Place 0.4 mg under the tongue every 5 (five) minutes as needed for chest pain.  08/09/14   Pyrtle, Lajuan Lines, MD  omega-3 acid ethyl esters (LOVAZA) 1 G capsule Take 2 g by mouth 2 (two) times daily.    [provider]  ondansetron (ZOFRAN-ODT) 4 MG disintegrating tablet Take 4 mg by mouth 4 (four) times daily as needed for nausea.    [provider]  oxyCODONE-acetaminophen (PERCOCET/ROXICET) 5-325 MG tablet Take 1 tablet by mouth every 8 (eight) hours as needed. Patient taking differently: Take 1 tablet by  mouth 3 (three) times daily.  05/10/16   Orson Eva, MD  pantoprazole (PROTONIX) 40 MG tablet Take 1 tablet (40 mg total) by mouth daily before breakfast. 10/30/14   Adrian Prows, MD  pregabalin (LYRICA) 100 MG capsule Take 1 capsule (100 mg total) by mouth 3 (three) times daily. 05/10/16   Orson Eva, MD  Probiotic CAPS Take 2 capsules by mouth daily after breakfast.    [provider]  Propylene Glycol (SYSTANE BALANCE OP) Place 2-3 drops into both eyes daily as needed (for dry eyes).    [provider]  rOPINIRole (REQUIP) 2 MG tablet Take 2 mg by mouth at bedtime.      [provider]  Tamsulosin HCl (FLOMAX) 0.4 MG CAPS Take 0.4 mg by mouth daily.    [provider]  torsemide (DEMADEX) 20 MG tablet Take 20 mg by mouth every morning. MAY TAKE AN EXTRA 20 MG ONCE A DAY FOR EXCESSIVE FLUID RETENTION    [provider]    Physical Exam: Vitals:   11/04/17 0536 11/04/17 0800  BP: (!) 225/96 (!) 197/97  Pulse: 75 67  Resp: 16 20  Temp: 99.1 F (37.3 C)   TempSrc: Oral   SpO2: 100% 100%     General:  Appears calm and comfortable in no acute distress Eyes:  PERRL, EOMI, normal lids, iris ENT:  grossly normal hearing, lips & tongue, mucous membranes of his mouth slightly dry but pink Neck:  no LAD, masses or thyromegaly Cardiovascular:  RRR, no m/r/g. Right BKA, trace edema left LE with compression stocking intact Respiratory:  Normal respiratory effort. BS slightly coarse no wheeze Abdomen:  Mildly distended mild tenderness bilateral lower quadrants no guarding or rebounding Skin:  no rash or induration seen on limited exam Musculoskeletal:  grossly normal tone BUE/BLE, good ROM, no bony abnormality right below the knee amputation prosthesis intact Psychiatric:  grossly normal mood and affect, speech fluent and appropriate, AOx3 Neurologic:  CN 2-12 grossly intact, moves all extremities in coordinated fashion, sensation intact  Labs on Admission: I have personally reviewed following labs and imaging studies  CBC: Recent Labs  Lab 11/04/17 0541  WBC 6.6  NEUTROABS 4.6  HGB 10.6*  HCT 31.3*  MCV 80.7  PLT 191   Basic Metabolic Panel: Recent Labs  Lab 11/04/17 0541  NA 139  K 3.3*  CL 106  CO2 24  GLUCOSE 105*  BUN 7  CREATININE 1.20  CALCIUM 8.5*   GFR: Estimated Creatinine Clearance: 84.4 mL/min (by C-G formula based on SCr of 1.2 mg/dL). Liver Function Tests: Recent Labs  Lab 11/04/17 0541  AST 26  ALT 13*  ALKPHOS 106  BILITOT 0.5  PROT 6.1*  ALBUMIN 3.0*   No results for input(s): LIPASE,  AMYLASE in the last 168 hours. No results for input(s): AMMONIA in the last 168 hours. Coagulation Profile: No results for input(s): INR, PROTIME in the last 168 hours. Cardiac Enzymes: No results for input(s): CKTOTAL, CKMB, CKMBINDEX, TROPONINI in the last 168 hours. BNP (last 3 results) No results for input(s): PROBNP in the last 8760 hours. HbA1C: No results for input(s): HGBA1C in the last 72 hours. CBG: No results for input(s): GLUCAP in the last 168 hours. Lipid Profile: No results for input(s): CHOL, HDL, LDLCALC, TRIG, CHOLHDL, LDLDIRECT in the last 72 hours. Thyroid Function Tests: No results for input(s): TSH, T4TOTAL, FREET4, T3FREE, THYROIDAB in the last 72 hours. Anemia Panel: No results for input(s): VITAMINB12, FOLATE, FERRITIN, TIBC,  IRON, RETICCTPCT in the last 72 hours. Urine analysis:    Component Value Date/Time   COLORURINE COLORLESS (A) 11/04/2017 0540   APPEARANCEUR CLEAR 11/04/2017 0540   LABSPEC 1.003 (L) 11/04/2017 0540   PHURINE 7.0 11/04/2017 0540   GLUCOSEU 50 (A) 11/04/2017 0540   HGBUR NEGATIVE 11/04/2017 0540   BILIRUBINUR NEGATIVE 11/04/2017 0540   KETONESUR NEGATIVE 11/04/2017 0540   PROTEINUR 100 (A) 11/04/2017 0540   UROBILINOGEN 0.2 01/14/2015 0346   NITRITE NEGATIVE 11/04/2017 0540   LEUKOCYTESUR NEGATIVE 11/04/2017 0540    Creatinine Clearance: Estimated Creatinine Clearance: 84.4 mL/min (by C-G formula based on SCr of 1.2 mg/dL).  Sepsis Labs: @LABRCNTIP (procalcitonin:4,lacticidven:4) )No results found for this or any previous visit (from the past 240 hour(s)).   Radiological Exams on Admission: No results found.  EKG: pending  Assessment/Plan Principal Problem:   Intra-abdominal abscess (HCC) Active Problems:   PVD (peripheral vascular disease) (HCC)   HTN (hypertension)   GERD (gastroesophageal reflux disease)   Diabetes mellitus, insulin dependent (IDDM), controlled (HCC)   CAD (dz of distal, mid and proximal RCA with  implantation of 3 overlapping drug-eluting stent,)   Chronic diastolic heart failure, NYHA class 2 (HCC)   Amputated toe ([place])   History of right below knee amputation ([place])   #1. Abdominal pain/intra-abdominal abscess. Outpatient CT of the abdomen pelvis last week reveals 5 cm right-sided pelvic abscess most likely related to sigmoid diverticulitis. Patient is afebrile hemodynamically stable no leukocytosis slightly elevated lactic acid at 1.9. He is provided with 1 L normal saline and Zosyn in the emergency department -Admit to telemetry -Interventional radiology to place a drain -Continue Zosyn -Analgesia -Supportive therapy  #2. CAD status post stents 2016. No chest pain in the emergency department. EKG is pending at the time of admission. Home medications include aspirin statin Plavix and imdur and coreg -hold home medications for now with plan to resume post drain placement  #3. Chronic diastolic heart failure. Compensated. Echo done 2016 reveals an EF of 55, moderate concentric hypertrophy grade 2 diastolic dysfunction. -Monitor daily weights -Intake and output -Resume home meds post procedure  #4. PAD?PVD. Status post left popliteal artery atherectomy in June 2012 right below the knee angioplasty 2013 and left below the knee PT 18 2014. -Stable -Resume home meds as indicated  #5. CAD status post angioplasty 2016. No chest pain on admission. KG pending. -stable  -resume home meds  #6. Diabetes. Home meds include Lantus sliding scale insulin and oral agents. Serum glucose 103 on admission. -We'll continue Lantus at a lower dose -Hold oral agents -Use sliding scale for optimal control -Obtain a hemoglobin A1c   #7. Hypertension. [place] control emergency department Home medications include Coreg, Imdur, amlodipine and Demadex -When necessary hydralazine -Resume home meds post procedure  #8. Chronic pain related to back pain severe vessel disease. Home medications include  Lyrica, fentanyl patch, Percocet.Marland Kitchen Appears to be stable at baseline -Continue home regimen postprocedure -Low-dose Dilaudid for any breakthrough   DVT prophylaxis:  plavix Code Status: full  Family Communication: son at bedside  Disposition Plan: home when ready  Consults called: wagner ID [facility] surgery Admission status: inpatient    Radene Gunning MD [facility]  If 7PM-7AM, please contact night-coverage [email or web address] Password [place]  11/04/2017, 9:43 AM

## 2017-11-05 DIAGNOSIS — I739 Peripheral vascular disease, unspecified: Secondary | ICD-10-CM

## 2017-11-05 LAB — GLUCOSE, CAPILLARY
Glucose-Capillary: 137 mg/dL — ABNORMAL HIGH (ref 65–99)
Glucose-Capillary: 144 mg/dL — ABNORMAL HIGH (ref 65–99)
Glucose-Capillary: 167 mg/dL — ABNORMAL HIGH (ref 65–99)
Glucose-Capillary: 175 mg/dL — ABNORMAL HIGH (ref 65–99)
Glucose-Capillary: 197 mg/dL — ABNORMAL HIGH (ref 65–99)
Glucose-Capillary: 210 mg/dL — ABNORMAL HIGH (ref 65–99)
Glucose-Capillary: 94 mg/dL (ref 65–99)
Glucose-Capillary: 96 mg/dL (ref 65–99)

## 2017-11-05 LAB — COMPREHENSIVE METABOLIC PANEL
ALT: 11 U/L — ABNORMAL LOW (ref 17–63)
AST: 13 U/L — ABNORMAL LOW (ref 15–41)
Albumin: 2.4 g/dL — ABNORMAL LOW (ref 3.5–5.0)
Alkaline Phosphatase: 86 U/L (ref 38–126)
Anion gap: 9 (ref 5–15)
BUN: 7 mg/dL (ref 6–20)
CO2: 24 mmol/L (ref 22–32)
Calcium: 7.8 mg/dL — ABNORMAL LOW (ref 8.9–10.3)
Chloride: 104 mmol/L (ref 101–111)
Creatinine, Ser: 1.5 mg/dL — ABNORMAL HIGH (ref 0.61–1.24)
GFR calc Af Amer: 55 mL/min — ABNORMAL LOW (ref >60)
GFR calc non Af Amer: 48 mL/min — ABNORMAL LOW (ref >60)
Glucose, Bld: 102 mg/dL — ABNORMAL HIGH (ref 65–99)
Potassium: 3.5 mmol/L (ref 3.5–5.1)
Sodium: 137 mmol/L (ref 135–145)
Total Bilirubin: 0.5 mg/dL (ref 0.3–1.2)
Total Protein: 4.9 g/dL — ABNORMAL LOW (ref 6.5–8.1)

## 2017-11-05 LAB — CBC
HCT: 26.1 % — ABNORMAL LOW (ref 39.0–52.0)
Hemoglobin: 8.9 g/dL — ABNORMAL LOW (ref 13.0–17.0)
MCH: 28 pg (ref 26.0–34.0)
MCHC: 34.1 g/dL (ref 30.0–36.0)
MCV: 82.1 fL (ref 78.0–100.0)
Platelets: 202 10*3/uL (ref 150–400)
RBC: 3.18 MIL/uL — ABNORMAL LOW (ref 4.22–5.81)
RDW: 13.7 % (ref 11.5–15.5)
WBC: 6.9 10*3/uL (ref 4.0–10.5)

## 2017-11-05 LAB — HEMOGLOBIN A1C
Hgb A1c MFr Bld: 6.9 % — ABNORMAL HIGH (ref 4.8–5.6)
Mean Plasma Glucose: 151 mg/dL

## 2017-11-05 LAB — HIV ANTIBODY (ROUTINE TESTING W REFLEX): HIV Screen 4th Generation wRfx: NONREACTIVE

## 2017-11-05 LAB — MRSA PCR SCREENING: MRSA by PCR: POSITIVE — AB

## 2017-11-05 MED ORDER — CARVEDILOL 25 MG PO TABS: 37.5000 mg | ORAL_TABLET | Freq: Two times a day (BID) | ORAL | Status: DC

## 2017-11-05 MED ORDER — TAMSULOSIN HCL 0.4 MG PO CAPS
0.4000 mg | ORAL_CAPSULE | Freq: Every day | ORAL | Status: DC
  Administered 2017-11-05 – 2017-11-10 (×6): 0.4 mg via ORAL
  Filled 2017-11-05 (×6): qty 1

## 2017-11-05 MED ORDER — LINACLOTIDE 145 MCG PO CAPS
145.0000 ug | ORAL_CAPSULE | Freq: Every day | ORAL | Status: DC | PRN
  Filled 2017-11-05: qty 1

## 2017-11-05 MED ORDER — PANTOPRAZOLE SODIUM 40 MG PO TBEC
40.0000 mg | DELAYED_RELEASE_TABLET | Freq: Every day | ORAL | Status: DC
  Administered 2017-11-06 – 2017-11-10 (×5): 40 mg via ORAL
  Filled 2017-11-05 (×5): qty 1

## 2017-11-05 MED ORDER — ISOSORBIDE MONONITRATE ER 60 MG PO TB24
120.0000 mg | ORAL_TABLET | Freq: Every day | ORAL | Status: DC
  Administered 2017-11-06 – 2017-11-10 (×5): 120 mg via ORAL
  Filled 2017-11-05 (×6): qty 2

## 2017-11-05 MED ORDER — TORSEMIDE 20 MG PO TABS
20.0000 mg | ORAL_TABLET | Freq: Every morning | ORAL | Status: DC
  Administered 2017-11-06 – 2017-11-07 (×3): 20 mg via ORAL
  Filled 2017-11-05 (×3): qty 1

## 2017-11-05 NOTE — Progress Notes (Signed)
Referring Physician(s): D Sarajane Jews  Supervising Physician: Aletta Edouard  Patient Status:  [facility] - In-pt  Chief Complaint:  Pelvic abscess  Subjective:  Drain placed 2/25 in IR Sore but better today OP blood tinged  Allergies: Ivp dye [iodinated diagnostic agents] and Adhesive [tape]  Medications: Prior to Admission medications   Medication Sig Start Date End Date Taking? Authorizing Provider  amLODipine-olmesartan (AZOR) 5-20 MG tablet Take 2 tablets by mouth every evening. 08/01/16  Yes Adrian Prows, MD  aspirin EC 81 MG tablet Take 81 mg by mouth daily.   Yes [provider]  atorvastatin (LIPITOR) 40 MG tablet Take 40 mg by mouth daily.   Yes [provider]  carvedilol (COREG) 25 MG tablet Take 1.5 tablets (37.5 mg total) by mouth 2 (two) times daily. 08/01/16  Yes Adrian Prows, MD  clobetasol (TEMOVATE) 0.05 % external solution Apply 1 application topically daily as needed (dry scalp).   Yes [provider]  clopidogrel (PLAVIX) 75 MG tablet Take 75 mg by mouth daily.  03/28/12  Yes [provider]  clotrimazole-betamethasone (LOTRISONE) cream Apply 1 application topically daily as needed (dry skin - beard).    Yes [provider]  collagenase (SANTYL) ointment Apply topically daily. Patient taking differently: Apply 1 application topically daily. TO AFFECTED SITE(S) 09/06/15  Yes Lavina Hamman, MD  fentaNYL (DURAGESIC - DOSED MCG/HR) 50 MCG/HR Place 1 patch (50 mcg total) onto the skin every 3 (three) days. 05/10/16  Yes Tat, Shanon Brow, MD  fluticasone (CUTIVATE) 0.05 % cream Apply 1 application topically 2 (two) times daily as needed (dry skin on face).    Yes [provider]  glyBURIDE-metformin (GLUCOVANCE) 2.5-500 MG per tablet Take 2 tablets by mouth 2 (two) times daily with a meal.  03/05/12  Yes [provider]  HYDROmorphone (DILAUDID) 2 MG tablet Take 1 tablet (2 mg total) by mouth at bedtime. Patient  taking differently: Take 2 mg by mouth at bedtime as needed (for breakthrough pain).  05/10/16  Yes Tat, Shanon Brow, MD  insulin glargine (LANTUS) 100 UNIT/ML injection Inject 0.6 mLs (60 Units total) into the skin at bedtime. Patient taking differently: Inject 50 Units into the skin at bedtime.  05/11/16  Yes Tat, Shanon Brow, MD  Insulin Glulisine (APIDRA) 100 UNIT/ML Solostar Pen Inject 13 Units into the skin 3 (three) times daily with meals. Patient taking differently: Inject 15-18 Units into the skin 3 (three) times daily as needed (for CBG). Sliding scale 05/11/16  Yes Tat, Shanon Brow, MD  isosorbide mononitrate (IMDUR) 120 MG 24 hr tablet Take 120 mg by mouth daily.   Yes [provider]  Linaclotide (LINZESS) 145 MCG CAPS capsule Take 1 capsule (145 mcg total) by mouth daily as needed. Patient taking differently: Take 145 mcg by mouth daily as needed (constipation).  01/20/15  Yes Florencia Reasons, MD  lisinopril (PRINIVIL,ZESTRIL) 10 MG tablet Take 10 mg by mouth daily.   Yes [provider]  Multiple Vitamin (MULTIVITAMIN WITH MINERALS) TABS tablet Take 1 tablet by mouth daily.   Yes [provider]  nitroGLYCERIN (NITRO-BID) 2 % ointment Apply 0.75 inches topically daily as needed for chest pain.    Yes [provider]  nitroGLYCERIN (NITRODUR - DOSED IN MG/24 HR) 0.2 mg/hr patch Place 1 patch (0.2 mg total) onto the skin daily. 01/08/17  Yes Suzan Slick, NP  nitroGLYCERIN (NITROSTAT) 0.4 MG SL tablet Place 1 tablet (0.4 mg total) under the tongue every 5 (five) minutes  as needed (as needed for esophageal spasm). Patient taking differently: Place 0.4 mg under the tongue every 5 (five) minutes as needed for chest pain.  08/09/14  Yes Pyrtle, Lajuan Lines, MD  omega-3 acid ethyl esters (LOVAZA) 1 G capsule Take 2 g by mouth 2 (two) times daily.   Yes [provider]  ondansetron (ZOFRAN-ODT) 4 MG disintegrating tablet Take 4 mg by mouth 4 (four) times daily as needed for nausea.    Yes [provider]  oxyCODONE-acetaminophen (PERCOCET/ROXICET) 5-325 MG tablet Take 1 tablet by mouth every 8 (eight) hours as needed. Patient taking differently: Take 1 tablet by mouth 2 (two) times daily as needed for moderate pain.  05/10/16  Yes Tat, Shanon Brow, MD  pantoprazole (PROTONIX) 40 MG tablet Take 1 tablet (40 mg total) by mouth daily before breakfast. 10/30/14  Yes Adrian Prows, MD  pregabalin (LYRICA) 100 MG capsule Take 1 capsule (100 mg total) by mouth 3 (three) times daily. 05/10/16  Yes Tat, Shanon Brow, MD  Probiotic CAPS Take 2 capsules by mouth daily after breakfast.   Yes [provider]  Propylene Glycol (SYSTANE BALANCE OP) Place 2-3 drops into both eyes daily as needed (for dry eyes).   Yes [provider]  rOPINIRole (REQUIP) 2 MG tablet Take 2 mg by mouth at bedtime.    Yes [provider]  Tamsulosin HCl (FLOMAX) 0.4 MG CAPS Take 0.4 mg by mouth daily.   Yes [provider]  torsemide (DEMADEX) 20 MG tablet Take 20 mg by mouth every morning. MAY TAKE AN EXTRA 20 MG ONCE A DAY FOR EXCESSIVE FLUID RETENTION   Yes [provider]     Vital Signs: BP (!) 149/60 (BP Location: Right Arm)   Pulse (!) 58   Temp 98.1 F (36.7 C) (Oral)   Resp 18   Ht 6\' 2"  (1.88 m)   Wt 255 lb 11.7 oz (116 kg)   SpO2 99%   BMI 32.83 kg/m   Physical Exam  Constitutional: He is oriented to person, place, and time.  Neurological: He is alert and oriented to person, place, and time.  Skin: Skin is warm and dry.  Site is clean and dry NT no bleeding OP blood tinged; 55 cc yesterday 10 cc in JP afeb   Psychiatric: He has a normal mood and affect.  Nursing note and vitals reviewed.   Imaging: Ct Image Guided Drainage By Percutaneous Catheter  Result Date: 11/04/2017 INDICATION: 62 year old male with a history of lower abdominal abscess secondary to diverticulitis EXAM: CT-GUIDED DRAINAGE ABDOMINAL ABSCESS MEDICATIONS: The patient is  currently admitted to the hospital and receiving intravenous antibiotics. The antibiotics were administered within an appropriate time frame prior to the initiation of the procedure. ANESTHESIA/SEDATION: 1.5 mg IV Versed 75 mcg IV Fentanyl Moderate Sedation Time:  13 minutes The patient was continuously monitored during the procedure by the interventional radiology nurse under my direct supervision. COMPLICATIONS: None TECHNIQUE: Informed written consent was obtained from the patient after a thorough discussion of the procedural risks, benefits and alternatives. All questions were addressed. Maximal Sterile Barrier Technique was utilized including caps, mask, sterile gowns, sterile gloves, sterile drape, hand hygiene and skin antiseptic. A timeout was performed prior to the initiation of the procedure. PROCEDURE: The operative field was prepped with Chlorhexidine in a sterile fashion, and a sterile drape was applied covering the operative field. A sterile gown and sterile gloves were used for the procedure. Local anesthesia was provided with 1% Lidocaine. Once  the patient is prepped and draped in the usual sterile fashion, 1% lidocaine was used for local anesthesia. Using modified Seldinger technique, a 10 French drain was placed in the abscess cavity within the lower abdomen. Proximally 25 cc of purulent in slightly bloody fluid aspirated. Sample was sent for culture. Catheter was sutured in position and attached to bulb suction. Patient tolerated the procedure well and remained hemodynamically stable throughout. No complications were encountered and no significant blood loss. FINDINGS: Scout CT demonstrates abscess within the lower abdomen, just superior to the urinary bladder and associated with sigmoid colon and the sigmoid colon inflammatory changes. Status post drainage there is a 10 French catheter within the abscess cavity which is collapsed. IMPRESSION: Status post CT-guided drainage of abdominal abscess.  Signed, Dulcy Fanny. Earleen Newport, DO [facility] Radiology Electronically Signed   By: Corrie Mckusick D.O.   On: 11/04/2017 15:09    Labs:  CBC: Recent Labs    11/04/17 0541 11/05/17 0707  WBC 6.6 6.9  HGB 10.6* 8.9*  HCT 31.3* 26.1*  PLT 265 202    COAGS: Recent Labs    11/04/17 0910  INR 1.05    BMP: Recent Labs    11/04/17 0541 11/05/17 0707  NA 139 137  K 3.3* 3.5  CL 106 104  CO2 24 24  GLUCOSE 105* 102*  BUN 7 7  CALCIUM 8.5* 7.8*  CREATININE 1.20 1.50*  GFRNONAA >60 48*  GFRAA >60 55*    LIVER FUNCTION TESTS: Recent Labs    11/04/17 0541 11/05/17 0707  BILITOT 0.5 0.5  AST 26 13*  ALT 13* 11*  ALKPHOS 106 86  PROT 6.1* 4.9*  ALBUMIN 3.0* 2.4*    Assessment and Plan:  Pelvic abscess Drain in place Will follow Can follow in IR OP [facility] - if home with drain   Electronically Signed: Madgie Dhaliwal A, PA-C 11/05/2017, 9:42 AM   I spent a total of 15 Minutes at the the patient's bedside AND on the patient's hospital floor or unit, greater than 50% of which was counseling/coordinating care for pelvic abscess drain

## 2017-11-05 NOTE — Progress Notes (Signed)
Subjective/Chief Complaint: Drain is painful but otherwise a little improved. Passing flatus, no Bm since admission   Objective: Vital signs in last 24 hours: Temp:  [97.7 F (36.5 C)-98.5 F (36.9 C)] 98.5 F (36.9 C) (02/26 0542) Pulse Rate:  [55-77] 61 (02/26 0542) Resp:  [13-25] 18 (02/26 0542) BP: (125-190)/(55-92) 125/57 (02/26 0542) SpO2:  [90 %-100 %] 97 % (02/26 0542) Weight:  [116 kg (255 lb 11.7 oz)-116.1 kg (255 lb 14.4 oz)] 116 kg (255 lb 11.7 oz) (02/26 0542) Last BM Date: 11/03/17(per pt)  Intake/Output from previous day: 02/25 0701 - 02/26 0700 In: 1840 [I.V.:1635; IV Piggyback:200] Out: 1005 [Urine:950; Drains:55] Intake/Output this shift: No intake/output data recorded.  General appearance: alert and cooperative Resp: clear to auscultation bilaterally Cardio: regular rate and rhythm GI: obese, soft, still tender in lower fields, no peritonitis. drain output murky serosanguinous Skin: Skin color, texture, turgor normal. No rashes or lesions  Lab Results:  Recent Labs    11/04/17 0541  WBC 6.6  HGB 10.6*  HCT 31.3*  PLT 265   BMET Recent Labs    11/04/17 0541  NA 139  K 3.3*  CL 106  CO2 24  GLUCOSE 105*  BUN 7  CREATININE 1.20  CALCIUM 8.5*   PT/INR Recent Labs    11/04/17 0910  LABPROT 13.6  INR 1.05   ABG No results for input(s): PHART, HCO3 in the last 72 hours.  Invalid input(s): PCO2, PO2  Studies/Results: Ct Image Guided Drainage By Percutaneous Catheter  Result Date: 11/04/2017 INDICATION: 62 year old male with a history of lower abdominal abscess secondary to diverticulitis EXAM: CT-GUIDED DRAINAGE ABDOMINAL ABSCESS MEDICATIONS: The patient is currently admitted to the hospital and receiving intravenous antibiotics. The antibiotics were administered within an appropriate time frame prior to the initiation of the procedure. ANESTHESIA/SEDATION: 1.5 mg IV Versed 75 mcg IV Fentanyl Moderate Sedation Time:  13 minutes The  patient was continuously monitored during the procedure by the interventional radiology nurse under my direct supervision. COMPLICATIONS: None TECHNIQUE: Informed written consent was obtained from the patient after a thorough discussion of the procedural risks, benefits and alternatives. All questions were addressed. Maximal Sterile Barrier Technique was utilized including caps, mask, sterile gowns, sterile gloves, sterile drape, hand hygiene and skin antiseptic. A timeout was performed prior to the initiation of the procedure. PROCEDURE: The operative field was prepped with Chlorhexidine in a sterile fashion, and a sterile drape was applied covering the operative field. A sterile gown and sterile gloves were used for the procedure. Local anesthesia was provided with 1% Lidocaine. Once the patient is prepped and draped in the usual sterile fashion, 1% lidocaine was used for local anesthesia. Using modified Seldinger technique, a 10 French drain was placed in the abscess cavity within the lower abdomen. Proximally 25 cc of purulent in slightly bloody fluid aspirated. Sample was sent for culture. Catheter was sutured in position and attached to bulb suction. Patient tolerated the procedure well and remained hemodynamically stable throughout. No complications were encountered and no significant blood loss. FINDINGS: Scout CT demonstrates abscess within the lower abdomen, just superior to the urinary bladder and associated with sigmoid colon and the sigmoid colon inflammatory changes. Status post drainage there is a 10 French catheter within the abscess cavity which is collapsed. IMPRESSION: Status post CT-guided drainage of abdominal abscess. Signed, Dulcy Fanny. Earleen Newport, DO [facility] Radiology Electronically Signed   By: Corrie Mckusick D.O.   On: 11/04/2017 15:09  Anti-infectives: Anti-infectives (From admission, onward)   Start     Dose/Rate Route Frequency Ordered  Stop   11/04/17 1500  piperacillin-tazobactam (ZOSYN) IVPB 3.375 g     3.375 g 12.5 mL/hr over 240 Minutes Intravenous Every 8 hours 11/04/17 0834     11/04/17 0730  piperacillin-tazobactam (ZOSYN) IVPB 3.375 g     3.375 g 100 mL/hr over 30 Minutes Intravenous  Once 11/04/17 0721 11/04/17 1035      Assessment/Plan: s/p * No surgery found * Diverticular abscess s/p perc drain. Continue IV abx. Mobilize. Labs pending.   LOS: 1 day    Clovis Riley 11/05/2017

## 2017-11-05 NOTE — Progress Notes (Signed)
  PROGRESS NOTE  [name] EOF:[id number] DOB: [birth year] DOA: 11/04/2017 PCP: Anda Kraft, MD  Brief Narrative: 62 year old man with PMH diabetes mellitus presented with abdominal pain, found to have a 5 cm right-sided pelvic abscess.  Admitted for antibiotics, percutaneous drainage, surgery consult.  Assessment/Plan Pelvic abscess, right side, thought to be associated with diverticulitis, however cannot exclude appendicitis. -Status post percutaneous drain placement 2/25.  Continue empiric antibiotics. -Surgery following, recommends conservative care.  Diabetes mellitus type 2, peripheral neuropathy, peripheral vascular disease.  Hemoglobin A1c 6.9. -Blood sugars stable  Chronic kidney disease stage III, appears to be at baseline  Coronary artery disease, status post stent placement 2016 -Continue Imdur -Resume Plavix if no further intervention planned  Chronic diastolic congestive heart failure - stable, continue torsemide, beta-blocker  DVT prophylaxis: SCDs Code Status: full Family Communication: none Disposition Plan: home    Harold Hodgkins, MD  [facility] Direct contact: [phone] --Via amion app OR  --[email or web address]; password [email or web address]  7PM-7AM contact night coverage as above 11/05/2017, 4:30 PM  LOS: 1 day   Consultants:  Interventional radiology  General surgery  Procedures:  CT guided drainage of abd abscess.  81F drain to bulb.   Antimicrobials:  Zosyn 2/25 >>  Interval history/Subjective: Has some abdominal pain.  Tolerating diet.  Objective: Vitals:  Vitals:   11/05/17 0831 11/05/17 1447  BP: (!) 149/60 (!) 141/55  Pulse: (!) 58 63  Resp: 18 18  Temp: 98.1 F (36.7 C) 98.7 F (37.1 C)  SpO2: 99% 99%    Exam:  Constitutional:  . Appears calm and comfortable Respiratory:  . CTA bilaterally, no w/r/r.  . Respiratory effort normal Cardiovascular:  . RRR, no m/r/g Abdomen:  . Soft, nondistended Psychiatric:  . Mental  status o Mood, affect appropriate  I have personally reviewed the following:   Labs:  Blood sugars stable  Creatinine 1.5, remainder BMP unremarkable.  LFTs unremarkable.  Hemoglobin stable 8.9.  WBC 6.9.  Imaging studies:  CT abdomen pelvis showed a 5 cm right-sided pelvic abscess thought to be related to the sigmoid colon.  Scheduled Meds: . amLODipine  5 mg Oral Daily  . atorvastatin  40 mg Oral q1800  . carvedilol  12.5 mg Oral BID WC  . fentaNYL  50 mcg Transdermal Q72H  . insulin aspart  0-9 Units Subcutaneous Q4H  . isosorbide mononitrate  120 mg Oral Daily  . [START ON 11/06/2017] pantoprazole  40 mg Oral QAC breakfast  . pregabalin  100 mg Oral TID  . sodium chloride flush  5 mL Intracatheter Q8H  . tamsulosin  0.4 mg Oral Daily  . [START ON 11/06/2017] torsemide  20 mg Oral q morning - 10a   Continuous Infusions: . 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

## 2017-11-06 DIAGNOSIS — E119 Type 2 diabetes mellitus without complications: Secondary | ICD-10-CM

## 2017-11-06 DIAGNOSIS — I2510 Atherosclerotic heart disease of native coronary artery without angina pectoris: Secondary | ICD-10-CM

## 2017-11-06 DIAGNOSIS — K651 Peritoneal abscess: Secondary | ICD-10-CM

## 2017-11-06 DIAGNOSIS — I5032 Chronic diastolic (congestive) heart failure: Secondary | ICD-10-CM

## 2017-11-06 DIAGNOSIS — Z794 Long term (current) use of insulin: Secondary | ICD-10-CM

## 2017-11-06 LAB — GLUCOSE, CAPILLARY
Glucose-Capillary: 118 mg/dL — ABNORMAL HIGH (ref 65–99)
Glucose-Capillary: 125 mg/dL — ABNORMAL HIGH (ref 65–99)
Glucose-Capillary: 165 mg/dL — ABNORMAL HIGH (ref 65–99)
Glucose-Capillary: 210 mg/dL — ABNORMAL HIGH (ref 65–99)
Glucose-Capillary: 245 mg/dL — ABNORMAL HIGH (ref 65–99)
Glucose-Capillary: 319 mg/dL — ABNORMAL HIGH (ref 65–99)

## 2017-11-06 LAB — BLOOD CULTURE ID PANEL (REFLEXED)

## 2017-11-06 LAB — AEROBIC/ANAEROBIC CULTURE (SURGICAL/DEEP WOUND)

## 2017-11-06 LAB — AEROBIC/ANAEROBIC CULTURE W GRAM STAIN (SURGICAL/DEEP WOUND)

## 2017-11-06 MED ORDER — CLOPIDOGREL BISULFATE 75 MG PO TABS: 75.0000 mg | ORAL_TABLET | Freq: Every day | ORAL | Status: DC

## 2017-11-06 MED ORDER — ASPIRIN EC 81 MG PO TBEC
81.0000 mg | DELAYED_RELEASE_TABLET | Freq: Every day | ORAL | Status: DC
  Administered 2017-11-06 – 2017-11-10 (×5): 81 mg via ORAL
  Filled 2017-11-06 (×5): qty 1

## 2017-11-06 MED ORDER — CLOPIDOGREL BISULFATE 75 MG PO TABS
75.0000 mg | ORAL_TABLET | Freq: Every day | ORAL | Status: DC
  Administered 2017-11-07 – 2017-11-10 (×4): 75 mg via ORAL
  Filled 2017-11-06 (×4): qty 1

## 2017-11-06 MED ORDER — ENOXAPARIN SODIUM 40 MG/0.4ML ~~LOC~~ SOLN
40.0000 mg | SUBCUTANEOUS | Status: DC
  Administered 2017-11-06 – 2017-11-10 (×5): 40 mg via SUBCUTANEOUS
  Filled 2017-11-06 (×5): qty 0.4

## 2017-11-06 MED ORDER — HYDROMORPHONE HCL 1 MG/ML IJ SOLN
0.5000 mg | Freq: Once | INTRAMUSCULAR | Status: AC
  Administered 2017-11-06: 0.5 mg via INTRAVENOUS
  Filled 2017-11-06: qty 1

## 2017-11-06 NOTE — Progress Notes (Signed)
Subjective/Chief Complaint: Intermittent lower abdominal pain, has been on soft diet   Objective: Vital signs in last 24 hours: Temp:  [98.1 F (36.7 C)-99.8 F (37.7 C)] 98.8 F (37.1 C) (02/27 0451) Pulse Rate:  [58-81] 81 (02/27 0451) Resp:  [18] 18 (02/27 0451) BP: (141-155)/(52-60) 151/52 (02/27 0451) SpO2:  [95 %-100 %] 95 % (02/27 0451) Weight:  [119.8 kg (264 lb 1.8 oz)] 119.8 kg (264 lb 1.8 oz) (02/27 0451) Last BM Date: 11/03/17  Intake/Output from previous day: 02/26 0701 - 02/27 0700 In: 165 [I.V.:15; IV Piggyback:150] Out: 3065 [Urine:3025; Drains:40] Intake/Output this shift: No intake/output data recorded.  General appearance: cooperative Resp: clear to auscultation bilaterally Cardio: regular rate and rhythm GI: soft, tender lower mid abdomen without peritonitis  Lab Results:  Recent Labs    11/04/17 0541 11/05/17 0707  WBC 6.6 6.9  HGB 10.6* 8.9*  HCT 31.3* 26.1*  PLT 265 202   BMET Recent Labs    11/04/17 0541 11/05/17 0707  NA 139 137  K 3.3* 3.5  CL 106 104  CO2 24 24  GLUCOSE 105* 102*  BUN 7 7  CREATININE 1.20 1.50*  CALCIUM 8.5* 7.8*   PT/INR Recent Labs    11/04/17 0910  LABPROT 13.6  INR 1.05   ABG No results for input(s): PHART, HCO3 in the last 72 hours.  Invalid input(s): PCO2, PO2  Studies/Results: Ct Image Guided Drainage By Percutaneous Catheter  Result Date: 11/04/2017 INDICATION: 62 year old male with a history of lower abdominal abscess secondary to diverticulitis EXAM: CT-GUIDED DRAINAGE ABDOMINAL ABSCESS MEDICATIONS: The patient is currently admitted to the hospital and receiving intravenous antibiotics. The antibiotics were administered within an appropriate time frame prior to the initiation of the procedure. ANESTHESIA/SEDATION: 1.5 mg IV Versed 75 mcg IV Fentanyl Moderate Sedation Time:  13 minutes The patient was continuously monitored during the procedure by the interventional radiology nurse under my  direct supervision. COMPLICATIONS: None TECHNIQUE: Informed written consent was obtained from the patient after a thorough discussion of the procedural risks, benefits and alternatives. All questions were addressed. Maximal Sterile Barrier Technique was utilized including caps, mask, sterile gowns, sterile gloves, sterile drape, hand hygiene and skin antiseptic. A timeout was performed prior to the initiation of the procedure. PROCEDURE: The operative field was prepped with Chlorhexidine in a sterile fashion, and a sterile drape was applied covering the operative field. A sterile gown and sterile gloves were used for the procedure. Local anesthesia was provided with 1% Lidocaine. Once the patient is prepped and draped in the usual sterile fashion, 1% lidocaine was used for local anesthesia. Using modified Seldinger technique, a 10 French drain was placed in the abscess cavity within the lower abdomen. Proximally 25 cc of purulent in slightly bloody fluid aspirated. Sample was sent for culture. Catheter was sutured in position and attached to bulb suction. Patient tolerated the procedure well and remained hemodynamically stable throughout. No complications were encountered and no significant blood loss. FINDINGS: Scout CT demonstrates abscess within the lower abdomen, just superior to the urinary bladder and associated with sigmoid colon and the sigmoid colon inflammatory changes. Status post drainage there is a 10 French catheter within the abscess cavity which is collapsed. IMPRESSION: Status post CT-guided drainage of abdominal abscess. Signed, Dulcy Fanny. Earleen Newport, DO [facility] Radiology Electronically Signed   By: Corrie Mckusick D.O.   On: 11/04/2017 15:09    Anti-infectives: Anti-infectives (From admission, onward)   Start  Dose/Rate Route Frequency Ordered Stop   11/04/17 1500  piperacillin-tazobactam (ZOSYN) IVPB 3.375 g     3.375 g 12.5 mL/hr over 240  Minutes Intravenous Every 8 hours 11/04/17 0834     11/04/17 0730  piperacillin-tazobactam (ZOSYN) IVPB 3.375 g     3.375 g 100 mL/hr over 30 Minutes Intravenous  Once 11/04/17 9861 11/04/17 1035      Assessment/Plan: Sigmoid diverticulitis - S/P perc drain. Continue Zosyn. Having pain still so back diet down to fulls. CBC in AM.  LOS: 2 days    Zenovia Jarred 11/06/2017

## 2017-11-06 NOTE — Progress Notes (Signed)
PROGRESS NOTE        PATIENT DETAILS Name: [name] Age: 62 y.o. Sex: male Date of Birth: 1956-07-02 Admit Date: 11/04/2017 Admitting Physician Karmen Bongo, MD EZM:OQHUT, Thayer Jew, MD  Brief Narrative: Patient is a 62 y.o. male with history of CAD status post PCI 6546, chronic diastolic heart failure, DM-2 who presented with abdominal pain, found to have 5 cm right-sided pelvic abscess, general surgery and interventional radiology were consulted, he underwent percutaneous drainage-and remains on empiric antimicrobial therapy.  Slowly improving with above-noted care.  See below for further details.  Subjective: Continues to have abdominal pain-seen earlier by general surgery and downgraded to a liquid diet.   Assessment/Plan: Pelvic abscess: Thought to be secondary to diverticulitis-underwent percutaneous drain placement on 2/25, cultures on 2/25 showed mixed flora-continues to have abdominal pain-diet downgraded to full liquids.  Remains on empiric antimicrobial therapy-we will await further recommendations from general surgery and interventional radiology.  CAD status post PCI in 2016: Currently no anginal symptoms-resume antiplatelet agents  Chronic diastolic heart failure: Compensated-continue Demadex  Hypokalemia: Repleted-could be secondary to diuretics, recheck electrolytes tomorrow  Chronic kidney disease stage III: Creatinine close to usual baseline-follow electrolytes closely  DM-2: CBG stable with SSI  Hypertension: Appears to be controlled-continue Coreg, amlodipine, Imdur  Chronic pain syndrome: Continue transdermal fentanyl-and Dilaudid as needed for breakthrough pain  GERD: Continue PPI  TKP:TWSFKCLE Flomax  History of right below-knee amputation  DVT Prophylaxis: Prophylactic Lovenox   Code Status: Full code   Family Communication: None at bedside  Disposition Plan: Remain inpatient-home in the next 2 days or so-once  cleared by general surgery  Antimicrobial agents: Anti-infectives (From admission, onward)   Start     Dose/Rate Route Frequency Ordered Stop   11/04/17 1500  piperacillin-tazobactam (ZOSYN) IVPB 3.375 g     3.375 g 12.5 mL/hr over 240 Minutes Intravenous Every 8 hours 11/04/17 0834     11/04/17 0730  piperacillin-tazobactam (ZOSYN) IVPB 3.375 g     3.375 g 100 mL/hr over 30 Minutes Intravenous  Once 11/04/17 7517 11/04/17 1035      Procedures: 2/25>> CT guided percutaneous drain placement  CONSULTS:  general surgery and ir  Time spent: 25 minutes-Greater than 50% of this time was spent in counseling, explanation of diagnosis, planning of further management, and coordination of care.  MEDICATIONS: Scheduled Meds: . amLODipine  5 mg Oral Daily  . atorvastatin  40 mg Oral q1800  . carvedilol  12.5 mg Oral BID WC  . fentaNYL  50 mcg Transdermal Q72H  . insulin aspart  0-9 Units Subcutaneous Q4H  . isosorbide mononitrate  120 mg Oral Daily  . pantoprazole  40 mg Oral QAC breakfast  . pregabalin  100 mg Oral TID  . sodium chloride flush  5 mL Intracatheter Q8H  . tamsulosin  0.4 mg Oral Daily  . torsemide  20 mg Oral q morning - 10a   Continuous Infusions: . piperacillin-tazobactam (ZOSYN)  IV Stopped (11/06/17 1146)   PRN Meds:.acetaminophen **OR** acetaminophen, hydrALAZINE, HYDROmorphone (DILAUDID) injection, linaclotide, ondansetron **OR** ondansetron (ZOFRAN) IV, oxyCODONE-acetaminophen   PHYSICAL EXAM: Vital signs: Vitals:   11/05/17 1447 11/05/17 2109 11/06/17 0451 11/06/17 0858  BP: (!) 141/55 (!) 155/54 (!) 151/52 (!) 171/62  Pulse: 63 74 81 75  Resp: 18 18 18    Temp: 98.7 F (37.1 C) 99.8 F (37.7  C) 98.8 F (37.1 C)   TempSrc: Oral Oral Oral   SpO2: 99% 100% 95%   Weight:   119.8 kg (264 lb 1.8 oz)   Height:       Filed Weights   11/04/17 1912 11/05/17 0542 11/06/17 0451  Weight: 116.1 kg (255 lb 14.4 oz) 116 kg (255 lb 11.7 oz) 119.8 kg (264 lb 1.8  oz)   Body mass index is 33.91 kg/m.   General appearance :Awake, alert, not in any distress. Speech Clear. Not toxic Looking Eyes:, pupils equally reactive to light and accomodation,no scleral icterus.Pink conjunctiva HEENT: Atraumatic and Normocephalic Neck: supple, no JVD. No cervical lymphadenopathy. No thyromegaly Resp:Good air entry bilaterally, no added sounds  CVS: S1 S2 regular, no murmurs.  GI: Bowel sounds present, mildly tender in the lower abdomen.  Extremities: B/L Lower Ext shows no edema, both legs are warm to touch Neurology:  speech clear,Non focal, sensation is grossly intact. Psychiatric: Normal judgment and insight. Alert and oriented x 3. Normal mood. Musculoskeletal:No digital cyanosis Skin:No Rash, warm and dry Wounds:N/A  I have personally reviewed following labs and imaging studies  LABORATORY DATA: CBC: Recent Labs  Lab 11/04/17 0541 11/05/17 0707  WBC 6.6 6.9  NEUTROABS 4.6  --   HGB 10.6* 8.9*  HCT 31.3* 26.1*  MCV 80.7 82.1  PLT 265 409    Basic Metabolic Panel: Recent Labs  Lab 11/04/17 0541 11/05/17 0707  NA 139 137  K 3.3* 3.5  CL 106 104  CO2 24 24  GLUCOSE 105* 102*  BUN 7 7  CREATININE 1.20 1.50*  CALCIUM 8.5* 7.8*    GFR: Estimated Creatinine Clearance: 69.3 mL/min (A) (by C-G formula based on SCr of 1.5 mg/dL (H)).  Liver Function Tests: Recent Labs  Lab 11/04/17 0541 11/05/17 0707  AST 26 13*  ALT 13* 11*  ALKPHOS 106 86  BILITOT 0.5 0.5  PROT 6.1* 4.9*  ALBUMIN 3.0* 2.4*   No results for input(s): LIPASE, AMYLASE in the last 168 hours. No results for input(s): AMMONIA in the last 168 hours.  Coagulation Profile: Recent Labs  Lab 11/04/17 0910  INR 1.05    Cardiac Enzymes: No results for input(s): CKTOTAL, CKMB, CKMBINDEX, TROPONINI in the last 168 hours.  BNP (last 3 results) No results for input(s): PROBNP in the last 8760 hours.  HbA1C: Recent Labs    11/04/17 0541  HGBA1C 6.9*     CBG: Recent Labs  Lab 11/05/17 2205 11/06/17 0010 11/06/17 0409 11/06/17 0810 11/06/17 1217  GLUCAP 210* 165* 125* 118* 245*    Lipid Profile: No results for input(s): CHOL, HDL, LDLCALC, TRIG, CHOLHDL, LDLDIRECT in the last 72 hours.  Thyroid Function Tests: No results for input(s): TSH, T4TOTAL, FREET4, T3FREE, THYROIDAB in the last 72 hours.  Anemia Panel: No results for input(s): VITAMINB12, FOLATE, FERRITIN, TIBC, IRON, RETICCTPCT in the last 72 hours.  Urine analysis:    Component Value Date/Time   COLORURINE COLORLESS (A) 11/04/2017 0540   APPEARANCEUR CLEAR 11/04/2017 0540   LABSPEC 1.003 (L) 11/04/2017 0540   PHURINE 7.0 11/04/2017 0540   GLUCOSEU 50 (A) 11/04/2017 0540   HGBUR NEGATIVE 11/04/2017 0540   BILIRUBINUR NEGATIVE 11/04/2017 0540   KETONESUR NEGATIVE 11/04/2017 0540   PROTEINUR 100 (A) 11/04/2017 0540   UROBILINOGEN 0.2 01/14/2015 0346   NITRITE NEGATIVE 11/04/2017 0540   LEUKOCYTESUR NEGATIVE 11/04/2017 0540    Sepsis Labs: Lactic Acid, Venous    Component Value Date/Time   LATICACIDVEN 1.53 11/04/2017  0758    MICROBIOLOGY: Recent Results (from the past 240 hour(s))  Blood culture (routine x 2)     Status: None (Preliminary result)   Collection Time: 11/04/17  7:43 AM  Result Value Ref Range Status   Specimen Description BLOOD RIGHT ANTECUBITAL  Final   Special Requests   Final    BOTTLES DRAWN AEROBIC AND ANAEROBIC Blood Culture adequate volume   Culture   Final    NO GROWTH 2 DAYS Performed at [facility], [address], [place], [place] [address]    Report Status PENDING  Incomplete  Blood culture (routine x 2)     Status: None (Preliminary result)   Collection Time: 11/04/17  8:54 AM  Result Value Ref Range Status   Specimen Description BLOOD RIGHT FOREARM  Final   Special Requests   Final    BOTTLES DRAWN AEROBIC AND ANAEROBIC Blood Culture adequate volume   Culture   Final    NO GROWTH 2 DAYS Performed at [facility], [place] [address], [place], [place] [address]    Report Status PENDING  Incomplete  Aerobic/Anaerobic Culture (surgical/deep wound)     Status: Abnormal   Collection Time: 11/04/17 12:40 PM  Result Value Ref Range Status   Specimen Description ABSCESS  Final   Special Requests NONE  Final   Gram Stain   Final    ABUNDANT WBC PRESENT, PREDOMINANTLY PMN MODERATE GRAM NEGATIVE RODS MODERATE GRAM POSITIVE COCCI Performed at [facility], [address], [place], [place] [address]    Culture (A)  Final    MULTIPLE ORGANISMS PRESENT, NONE PREDOMINANT MIXED ANAEROBIC FLORA PRESENT.  CALL LAB IF FURTHER IID REQUIRED.    Report Status 11/06/2017 FINAL  Final  MRSA PCR Screening     Status: Abnormal   Collection Time: 11/04/17 10:26 PM  Result Value Ref Range Status   MRSA by PCR POSITIVE (A) NEGATIVE Final    Comment:        The GeneXpert MRSA Assay (FDA approved for NASAL specimens only), is one component of a comprehensive MRSA colonization surveillance program. It is not intended to diagnose MRSA infection nor to guide or monitor treatment for MRSA infections. CRITICAL RESULT CALLED TO, READ BACK BY AND VERIFIED WITH: Mellissa Kohut RN 11/05/17 0300 JDW Performed at [facility], [address] [place], [place] [address]     RADIOLOGY STUDIES/RESULTS: Ct Abdomen Pelvis Wo Contrast  Result Date: 10/31/2017 CLINICAL DATA:  Abdominal pain and swelling for 4 weeks. EXAM: CT ABDOMEN AND PELVIS WITHOUT CONTRAST TECHNIQUE: Multidetector CT imaging of the abdomen and pelvis was performed following the standard protocol without IV contrast. COMPARISON:  None. FINDINGS: Lower chest: The lung bases are clear. No pleural effusion or worrisome pulmonary lesions. The heart is normal in size. Coronary artery calcifications are noted. The distal esophagus is grossly normal. Hepatobiliary: No focal hepatic lesions or intrahepatic biliary dilatation. The gallbladder is surgically  absent. Mild associated common bile duct dilatation. Pancreas: No mass, inflammation or ductal dilatation. Spleen: Normal size.  No focal lesions. Adrenals/Urinary Tract: The adrenal glands and kidneys are unremarkable. Small bilateral renal calculi but no obstructing ureteral calculi or bladder calculi. Stomach/Bowel: The stomach, duodenum and small bowel are unremarkable. No acute inflammatory changes, mass lesions or obstructive findings. There is an inflammatory process in the right pelvis with a 5.4 x 4.7 cm abscess. This is located between the cecal tip/terminal ileum and the sigmoid colon. Although this  could be ruptured appendicitis I think it is more likely a diverticular abscess extending off of the right wall of the sigmoid colon. There is underlying sigmoid diverticulosis and on the coronal images there appears to be some air connecting the abscess with the colon. Significant surrounding inflammatory phlegmon involving the dome region of the bladder. Vascular/Lymphatic: Moderate atherosclerotic calcifications involving the aorta and iliac arteries. Scattered borderline mesenteric and retroperitoneal lymph nodes likely inflammatory/hyperplastic. No pelvic adenopathy. Borderline enlarged inguinal lymph nodes. Reproductive: The prostate gland and seminal vesicles are grossly normal. A penile prosthesis is noted. Other: No abdominal wall hernia or subcutaneous lesions. No ascites. Musculoskeletal: No significant bony findings. Degenerative changes involving the spine and spinal fusion hardware noted. IMPRESSION: 1. 5 cm right-sided pelvic abscess most likely related to sigmoid diverticulitis. It is possible this is ruptured appendicitis if the patient still has his appendix. I think that is less likely. There is significant surrounding inflammatory phlegmon involving the terminal ileum and also the dome of the bladder. 2. Small bilateral renal calculi but no obstructing ureteral calculi or bladder calculi.  3. Status post cholecystectomy.  No biliary dilatation. These results will be called to the ordering clinician or representative by the Radiologist Assistant, and communication documented in the PACS or zVision Dashboard. Electronically Signed   By: Marijo Sanes M.D.   On: 10/31/2017 16:41   Ct Image Guided Drainage By Percutaneous Catheter  Result Date: 11/04/2017 INDICATION: 62 year old male with a history of lower abdominal abscess secondary to diverticulitis EXAM: CT-GUIDED DRAINAGE ABDOMINAL ABSCESS MEDICATIONS: The patient is currently admitted to the hospital and receiving intravenous antibiotics. The antibiotics were administered within an appropriate time frame prior to the initiation of the procedure. ANESTHESIA/SEDATION: 1.5 mg IV Versed 75 mcg IV Fentanyl Moderate Sedation Time:  13 minutes The patient was continuously monitored during the procedure by the interventional radiology nurse under my direct supervision. COMPLICATIONS: None TECHNIQUE: Informed written consent was obtained from the patient after a thorough discussion of the procedural risks, benefits and alternatives. All questions were addressed. Maximal Sterile Barrier Technique was utilized including caps, mask, sterile gowns, sterile gloves, sterile drape, hand hygiene and skin antiseptic. A timeout was performed prior to the initiation of the procedure. PROCEDURE: The operative field was prepped with Chlorhexidine in a sterile fashion, and a sterile drape was applied covering the operative field. A sterile gown and sterile gloves were used for the procedure. Local anesthesia was provided with 1% Lidocaine. Once the patient is prepped and draped in the usual sterile fashion, 1% lidocaine was used for local anesthesia. Using modified Seldinger technique, a 10 French drain was placed in the abscess cavity within the lower abdomen. Proximally 25 cc of purulent in slightly bloody fluid aspirated. Sample was sent for culture. Catheter was  sutured in position and attached to bulb suction. Patient tolerated the procedure well and remained hemodynamically stable throughout. No complications were encountered and no significant blood loss. FINDINGS: Scout CT demonstrates abscess within the lower abdomen, just superior to the urinary bladder and associated with sigmoid colon and the sigmoid colon inflammatory changes. Status post drainage there is a 10 French catheter within the abscess cavity which is collapsed. IMPRESSION: Status post CT-guided drainage of abdominal abscess. Signed, Dulcy Fanny. Earleen Newport, DO [facility] Electronically Signed   By: Corrie Mckusick D.O.   On: 11/04/2017 15:09     LOS: 2 days   Oren Binet, MD  [facility] Pager:[phone]  If 7PM-7AM,  please contact night-coverage [email or web address] Password [facility] 11/06/2017, 2:01 PM

## 2017-11-07 DIAGNOSIS — R7881 Bacteremia: Secondary | ICD-10-CM

## 2017-11-07 DIAGNOSIS — N179 Acute kidney failure, unspecified: Secondary | ICD-10-CM

## 2017-11-07 LAB — CBC
HCT: 24.5 % — ABNORMAL LOW (ref 39.0–52.0)
Hemoglobin: 8.3 g/dL — ABNORMAL LOW (ref 13.0–17.0)
MCH: 28 pg (ref 26.0–34.0)
MCHC: 33.9 g/dL (ref 30.0–36.0)
MCV: 82.8 fL (ref 78.0–100.0)
Platelets: 171 10*3/uL (ref 150–400)
RBC: 2.96 MIL/uL — ABNORMAL LOW (ref 4.22–5.81)
RDW: 14.1 % (ref 11.5–15.5)
WBC: 6.7 10*3/uL (ref 4.0–10.5)

## 2017-11-07 LAB — BASIC METABOLIC PANEL
Anion gap: 9 (ref 5–15)
BUN: 10 mg/dL (ref 6–20)
CO2: 25 mmol/L (ref 22–32)
Calcium: 8 mg/dL — ABNORMAL LOW (ref 8.9–10.3)
Chloride: 105 mmol/L (ref 101–111)
Creatinine, Ser: 1.73 mg/dL — ABNORMAL HIGH (ref 0.61–1.24)
GFR calc Af Amer: 47 mL/min — ABNORMAL LOW (ref >60)
GFR calc non Af Amer: 40 mL/min — ABNORMAL LOW (ref >60)
Glucose, Bld: 221 mg/dL — ABNORMAL HIGH (ref 65–99)
Potassium: 3.6 mmol/L (ref 3.5–5.1)
Sodium: 139 mmol/L (ref 135–145)

## 2017-11-07 LAB — GLUCOSE, CAPILLARY
Glucose-Capillary: 174 mg/dL — ABNORMAL HIGH (ref 65–99)
Glucose-Capillary: 178 mg/dL — ABNORMAL HIGH (ref 65–99)
Glucose-Capillary: 252 mg/dL — ABNORMAL HIGH (ref 65–99)
Glucose-Capillary: 260 mg/dL — ABNORMAL HIGH (ref 65–99)
Glucose-Capillary: 266 mg/dL — ABNORMAL HIGH (ref 65–99)
Glucose-Capillary: 354 mg/dL — ABNORMAL HIGH (ref 65–99)
Glucose-Capillary: 357 mg/dL — ABNORMAL HIGH (ref 65–99)

## 2017-11-07 MED ORDER — SODIUM CHLORIDE 0.9 % IV SOLN
INTRAVENOUS | Status: DC
  Administered 2017-11-07 – 2017-11-08 (×2): via INTRAVENOUS

## 2017-11-07 NOTE — Progress Notes (Signed)
Referring Physician(s): Romana Juniper  Supervising Physician: Aletta Edouard  Patient Status:  [name] - In-pt  Chief Complaint:  Perforated diverticulum with abscess S/P drain 11/04/2017 by Dr. Earleen Newport  Subjective:  [name] is sitting on the side of the bed. He is doing well. No new complaints.  Allergies: Ivp dye [iodinated diagnostic agents] and Adhesive [tape]  Medications: Prior to Admission medications   Medication Sig Start Date End Date Taking? Authorizing Provider  amLODipine-olmesartan (AZOR) 5-20 MG tablet Take 2 tablets by mouth every evening. 08/01/16  Yes Adrian Prows, MD  aspirin EC 81 MG tablet Take 81 mg by mouth daily.   Yes [provider]  atorvastatin (LIPITOR) 40 MG tablet Take 40 mg by mouth daily.   Yes [provider]  carvedilol (COREG) 25 MG tablet Take 1.5 tablets (37.5 mg total) by mouth 2 (two) times daily. 08/01/16  Yes Adrian Prows, MD  clobetasol (TEMOVATE) 0.05 % external solution Apply 1 application topically daily as needed (dry scalp).   Yes [provider]  clopidogrel (PLAVIX) 75 MG tablet Take 75 mg by mouth daily.  03/28/12  Yes [provider]  clotrimazole-betamethasone (LOTRISONE) cream Apply 1 application topically daily as needed (dry skin - beard).    Yes [provider]  collagenase (SANTYL) ointment Apply topically daily. Patient taking differently: Apply 1 application topically daily. TO AFFECTED SITE(S) 09/06/15  Yes Lavina Hamman, MD  fentaNYL (DURAGESIC - DOSED MCG/HR) 50 MCG/HR Place 1 patch (50 mcg total) onto the skin every 3 (three) days. 05/10/16  Yes Tat, Shanon Brow, MD  fluticasone (CUTIVATE) 0.05 % cream Apply 1 application topically 2 (two) times daily as needed (dry skin on face).    Yes [provider]  glyBURIDE-metformin (GLUCOVANCE) 2.5-500 MG per tablet Take 2 tablets by mouth 2 (two) times daily with a meal.  03/05/12  Yes [provider]  HYDROmorphone  (DILAUDID) 2 MG tablet Take 1 tablet (2 mg total) by mouth at bedtime. Patient taking differently: Take 2 mg by mouth at bedtime as needed (for breakthrough pain).  05/10/16  Yes Tat, Shanon Brow, MD  insulin glargine (LANTUS) 100 UNIT/ML injection Inject 0.6 mLs (60 Units total) into the skin at bedtime. Patient taking differently: Inject 50 Units into the skin at bedtime.  05/11/16  Yes Tat, Shanon Brow, MD  Insulin Glulisine (APIDRA) 100 UNIT/ML Solostar Pen Inject 13 Units into the skin 3 (three) times daily with meals. Patient taking differently: Inject 15-18 Units into the skin 3 (three) times daily as needed (for CBG). Sliding scale 05/11/16  Yes Tat, Shanon Brow, MD  isosorbide mononitrate (IMDUR) 120 MG 24 hr tablet Take 120 mg by mouth daily.   Yes [provider]  Linaclotide (LINZESS) 145 MCG CAPS capsule Take 1 capsule (145 mcg total) by mouth daily as needed. Patient taking differently: Take 145 mcg by mouth daily as needed (constipation).  01/20/15  Yes Florencia Reasons, MD  lisinopril (PRINIVIL,ZESTRIL) 10 MG tablet Take 10 mg by mouth daily.   Yes [provider]  Multiple Vitamin (MULTIVITAMIN WITH MINERALS) TABS tablet Take 1 tablet by mouth daily.   Yes [provider]  nitroGLYCERIN (NITRO-BID) 2 % ointment Apply 0.75 inches topically daily as needed for chest pain.    Yes [provider]  nitroGLYCERIN (NITRODUR - DOSED IN MG/24 HR) 0.2 mg/hr patch Place 1 patch (0.2 mg total) onto the skin daily. 01/08/17  Yes Dondra Prader R, NP  nitroGLYCERIN (NITROSTAT) 0.4 MG SL tablet  Place 1 tablet (0.4 mg total) under the tongue every 5 (five) minutes as needed (as needed for esophageal spasm). Patient taking differently: Place 0.4 mg under the tongue every 5 (five) minutes as needed for chest pain.  08/09/14  Yes Pyrtle, Lajuan Lines, MD  omega-3 acid ethyl esters (LOVAZA) 1 G capsule Take 2 g by mouth 2 (two) times daily.   Yes [provider]  ondansetron (ZOFRAN-ODT) 4 MG  disintegrating tablet Take 4 mg by mouth 4 (four) times daily as needed for nausea.   Yes [provider]  oxyCODONE-acetaminophen (PERCOCET/ROXICET) 5-325 MG tablet Take 1 tablet by mouth every 8 (eight) hours as needed. Patient taking differently: Take 1 tablet by mouth 2 (two) times daily as needed for moderate pain.  05/10/16  Yes Tat, Shanon Brow, MD  pantoprazole (PROTONIX) 40 MG tablet Take 1 tablet (40 mg total) by mouth daily before breakfast. 10/30/14  Yes Adrian Prows, MD  pregabalin (LYRICA) 100 MG capsule Take 1 capsule (100 mg total) by mouth 3 (three) times daily. 05/10/16  Yes Tat, Shanon Brow, MD  Probiotic CAPS Take 2 capsules by mouth daily after breakfast.   Yes [provider]  Propylene Glycol (SYSTANE BALANCE OP) Place 2-3 drops into both eyes daily as needed (for dry eyes).   Yes [provider]  rOPINIRole (REQUIP) 2 MG tablet Take 2 mg by mouth at bedtime.    Yes [provider]  Tamsulosin HCl (FLOMAX) 0.4 MG CAPS Take 0.4 mg by mouth daily.   Yes [provider]  torsemide (DEMADEX) 20 MG tablet Take 20 mg by mouth every morning. MAY TAKE AN EXTRA 20 MG ONCE A DAY FOR EXCESSIVE FLUID RETENTION   Yes [provider]     Vital Signs: BP (!) 173/73   Pulse 64   Temp 98.8 F (37.1 C) (Oral)   Resp 16   Ht 6\' 2"  (1.88 m)   Wt 264 lb 1.6 oz (119.8 kg)   SpO2 99%   BMI 33.91 kg/m   Physical Exam Awake and alert Sitting on the side of the bed. Lower pelvic drain in place. He was worried it had pulled out some, but it is in good position, stat-lock in place. ~25 mL cloudy brown output. Flushes easily  Imaging: Ct Image Guided Drainage By Percutaneous Catheter  Result Date: 11/04/2017 INDICATION: 62 year old male with a history of lower abdominal abscess secondary to diverticulitis EXAM: CT-GUIDED DRAINAGE ABDOMINAL ABSCESS MEDICATIONS: The patient is currently admitted to the [name] and receiving intravenous antibiotics. The  antibiotics were administered within an appropriate time frame prior to the initiation of the procedure. ANESTHESIA/SEDATION: 1.5 mg IV Versed 75 mcg IV Fentanyl Moderate Sedation Time:  13 minutes The patient was continuously monitored during the procedure by the interventional radiology nurse under my direct supervision. COMPLICATIONS: None TECHNIQUE: Informed written consent was obtained from the patient after a thorough discussion of the procedural risks, benefits and alternatives. All questions were addressed. Maximal Sterile Barrier Technique was utilized including caps, mask, sterile gowns, sterile gloves, sterile drape, hand hygiene and skin antiseptic. A timeout was performed prior to the initiation of the procedure. PROCEDURE: The operative field was prepped with Chlorhexidine in a sterile fashion, and a sterile drape was applied covering the operative field. A sterile gown and sterile gloves were used for the procedure. Local anesthesia was provided with 1% Lidocaine. Once the patient is prepped and draped in the usual sterile fashion, 1% lidocaine was used for local anesthesia. Using  modified Seldinger technique, a 10 French drain was placed in the abscess cavity within the lower abdomen. Proximally 25 cc of purulent in slightly bloody fluid aspirated. Sample was sent for culture. Catheter was sutured in position and attached to bulb suction. Patient tolerated the procedure well and remained hemodynamically stable throughout. No complications were encountered and no significant blood loss. FINDINGS: Scout CT demonstrates abscess within the lower abdomen, just superior to the urinary bladder and associated with sigmoid colon and the sigmoid colon inflammatory changes. Status post drainage there is a 10 French catheter within the abscess cavity which is collapsed. IMPRESSION: Status post CT-guided drainage of abdominal abscess. Signed, Dulcy Fanny. Earleen Newport, DO [facility]  [name] Electronically Signed   By: Corrie Mckusick D.O.   On: 11/04/2017 15:09    Labs:  CBC: Recent Labs    11/04/17 0541 11/05/17 0707 11/07/17 0706  WBC 6.6 6.9 6.7  HGB 10.6* 8.9* 8.3*  HCT 31.3* 26.1* 24.5*  PLT 265 202 171    COAGS: Recent Labs    11/04/17 0910  INR 1.05    BMP: Recent Labs    11/04/17 0541 11/05/17 0707 11/07/17 0706  NA 139 137 139  K 3.3* 3.5 3.6  CL 106 104 105  CO2 24 24 25   GLUCOSE 105* 102* 221*  BUN 7 7 10   CALCIUM 8.5* 7.8* 8.0*  CREATININE 1.20 1.50* 1.73*  GFRNONAA >60 48* 40*  GFRAA >60 55* 47*    LIVER FUNCTION TESTS: Recent Labs    11/04/17 0541 11/05/17 0707  BILITOT 0.5 0.5  AST 26 13*  ALT 13* 11*  ALKPHOS 106 86  PROT 6.1* 4.9*  ALBUMIN 3.0* 2.4*    Assessment and Plan:  Continue routine drain care, empty and record output (orange output card given to patient).  Follow up in our clinic in about 2 weeks for CT scan drain injection.  Electronically Signed: Murrell Redden, PA-C 11/07/2017, 9:33 AM   I spent a total of 15 Minutes at the the patient's bedside AND on the patient's [name] floor or unit, greater than 50% of which was counseling/coordinating care for f/u pelvic drain.

## 2017-11-07 NOTE — Progress Notes (Signed)
Inpatient Diabetes Program Recommendations  AACE/ADA: New Consensus Statement on Inpatient Glycemic Control (2015)  Target Ranges:  Prepandial:   less than 140 mg/dL      Peak postprandial:   less than 180 mg/dL (1-2 hours)      Critically ill patients:  140 - 180 mg/dL   Lab Results  Component Value Date   GLUCAP 266 (H) 11/07/2017   HGBA1C 6.9 (H) 11/04/2017    Review of Glycemic Control Results for [name], [name] (MRN [id number]) as of 11/07/2017 10:44  Ref. Range 11/06/2017 20:23 11/07/2017 00:01 11/07/2017 04:27 11/07/2017 08:26  Glucose-Capillary Latest Ref Range: 65 - 99 mg/dL 319 (H) 174 (H) 178 (H) 266 (H)   Diabetes history: Type 2 DM Outpatient Diabetes medications: Glucovance 5-1,000 mg BID, Lantus 60 Units QHS, Apidra 15-18 Units TIDAC Current orders for Inpatient glycemic control: Novolog 0-9 Units Q4H  Inpatient Diabetes Program Recommendations:    As diet continues to progress, consider reducing frequency of Novolog 0-9 Units Q4H and change to Novolog 0-15 Units [facility]. Additionally, ensure notation for diet is carb modified.   Also, patient needs basal insulin. Would recommend Lantus 30 Units QHS (Half of home dose regimen) as FSBS this AM was 266 mg/dL.  Thanks, Bronson Curb, MSN, RNC-OB Diabetes Coordinator [phone] (8a-5p)

## 2017-11-07 NOTE — Progress Notes (Signed)
PHARMACY - PHYSICIAN COMMUNICATION CRITICAL VALUE ALERT - BLOOD CULTURE IDENTIFICATION (BCID)  [name] is an 62 y.o. male who presented to [place] on 11/04/2017  Assessment:  Pelvic abscess   Name of physician (or Provider) Contacted: K Schorr ([facility])  Current antibiotics: Zosyn  Changes to prescribed antibiotics recommended:  Patient is on recommended antibiotics - No changes needed  Blood culture 2/25>>Gram negative rods>>BCID with "no detection"  Results for orders placed or performed during the hospital encounter of 11/04/17  Blood Culture ID Panel (Reflexed) (Collected: 11/04/2017  8:54 AM)  Result Value Ref Range   Enterococcus species NOT DETECTED NOT DETECTED   Listeria monocytogenes NOT DETECTED NOT DETECTED   Staphylococcus species NOT DETECTED NOT DETECTED   Staphylococcus aureus NOT DETECTED NOT DETECTED   Streptococcus species NOT DETECTED NOT DETECTED   Streptococcus agalactiae NOT DETECTED NOT DETECTED   Streptococcus pneumoniae NOT DETECTED NOT DETECTED   Streptococcus pyogenes NOT DETECTED NOT DETECTED   Acinetobacter baumannii NOT DETECTED NOT DETECTED   Enterobacteriaceae species NOT DETECTED NOT DETECTED   Enterobacter cloacae complex NOT DETECTED NOT DETECTED   Escherichia coli NOT DETECTED NOT DETECTED   Klebsiella oxytoca NOT DETECTED NOT DETECTED   Klebsiella pneumoniae NOT DETECTED NOT DETECTED   Proteus species NOT DETECTED NOT DETECTED   Serratia marcescens NOT DETECTED NOT DETECTED   Haemophilus influenzae NOT DETECTED NOT DETECTED   Neisseria meningitidis NOT DETECTED NOT DETECTED   Pseudomonas aeruginosa NOT DETECTED NOT DETECTED   Candida albicans NOT DETECTED NOT DETECTED   Candida glabrata NOT DETECTED NOT DETECTED   Candida krusei NOT DETECTED NOT DETECTED   Candida parapsilosis NOT DETECTED NOT DETECTED   Candida tropicalis NOT DETECTED NOT DETECTED    [name] 11/07/2017  12:06 AM

## 2017-11-07 NOTE — Progress Notes (Signed)
   Subjective/Chief Complaint: Pain improving   Objective: Vital signs in last 24 hours: Temp:  [98.8 F (37.1 C)-100.2 F (37.9 C)] 98.8 F (37.1 C) (02/28 0431) Pulse Rate:  [62-77] 64 (02/28 0833) Resp:  [16-20] 16 (02/28 0431) BP: (134-173)/(40-73) 173/73 (02/28 0833) SpO2:  [97 %-99 %] 99 % (02/28 0431) Weight:  [119.8 kg (264 lb 1.6 oz)] 119.8 kg (264 lb 1.6 oz) (02/28 0437) Last BM Date: 11/06/17  Intake/Output from previous day: 02/27 0701 - 02/28 0700 In: 1165 [P.O.:1000; I.V.:10; IV Piggyback:150] Out: 1800 [Urine:1775; Drains:25] Intake/Output this shift: No intake/output data recorded.  General appearance: cooperative Resp: clear to auscultation bilaterally Cardio: regular rate and rhythm GI: soft, tender lower mid abdomen without peritonitis  Drain output feculent  Lab Results:  Recent Labs    11/05/17 0707 11/07/17 0706  WBC 6.9 6.7  HGB 8.9* 8.3*  HCT 26.1* 24.5*  PLT 202 171   BMET Recent Labs    11/05/17 0707 11/07/17 0706  NA 137 139  K 3.5 3.6  CL 104 105  CO2 24 25  GLUCOSE 102* 221*  BUN 7 10  CREATININE 1.50* 1.73*  CALCIUM 7.8* 8.0*   PT/INR Recent Labs    11/04/17 0910  LABPROT 13.6  INR 1.05   ABG No results for input(s): PHART, HCO3 in the last 72 hours.  Invalid input(s): PCO2, PO2  Studies/Results: No results found.  Anti-infectives: Anti-infectives (From admission, onward)   Start     Dose/Rate Route Frequency Ordered Stop   11/04/17 1500  piperacillin-tazobactam (ZOSYN) IVPB 3.375 g     3.375 g 12.5 mL/hr over 240 Minutes Intravenous Every 8 hours 11/04/17 0834     11/04/17 0730  piperacillin-tazobactam (ZOSYN) IVPB 3.375 g     3.375 g 100 mL/hr over 30 Minutes Intravenous  Once 11/04/17 1655 11/04/17 1035      Assessment/Plan: Sigmoid diverticulitis - S/P perc drain. Continue Zosyn. No leukocytosis. Try soft diet  LOS: 3 days    Clovis Riley 11/07/2017

## 2017-11-07 NOTE — Progress Notes (Signed)
PROGRESS NOTE        PATIENT DETAILS Name: [name] Age: 62 y.o. Sex: male Date of Birth: Feb 18, 1956 Admit Date: 11/04/2017 Admitting Physician Karmen Bongo, MD [id number], Thayer Jew, MD  Brief Narrative: Patient is a 63 y.o. male with history of CAD status post PCI 8466, chronic diastolic heart failure, DM-2 who presented with abdominal pain, found to have 5 cm right-sided pelvic abscess, general surgery and interventional radiology were consulted, he underwent percutaneous drainage-and remains on empiric antimicrobial therapy.  Slowly improving with above-noted care.  See below for further details.  Subjective: Some loose stools overnight-continues to have some abdominal pain.  Assessment/Plan: Pelvic abscess with gram-negative bacteremia: Thought to be secondary to diverticulitis with perforation-underwent drain placement on 2/25.  Abscess culture positive for mixed flora, however blood cultures now positive for gram-negative bacteria.  Remains on Zosyn.  Await final ID on blood culture.  Diet to be upgraded to soft diet today, general surgery and interventional radiology.  Acute kidney injury on chronic kidney disease stage III: Probably hemodynamically mediated-in the setting of bacteremia, diarrhea.  Since creatinine worsening-will start IV fluids-avoid nephrotoxic agents.  Repeat electrolytes tomorrow  Diarrhea: Claims to have a few loose stools overnight-doubt C. difficile at this time-we will follow closely-and if diarrhea worsens or persist, then can consider further workup.  CAD status post PCI in 2016: Currently no anginal symptoms-continue antiplatelet agents  Chronic diastolic heart failure: Compensated-hold diuretics given acute kidney injury  Hypokalemia: Repleted  DM-2: CBG stable with SSI  Hypertension: Fluctuating-for now continue with Coreg, amlodipine and Imdur-if continues to be persistently elevated then will adjust medications.     Chronic pain syndrome: Continue transdermal fentanyl-and Dilaudid for breakthrough pain.    GERD: Continue PPI  ZLD:JTTSVXBL Flomax  History of right below-knee amputation  DVT Prophylaxis: Prophylactic Lovenox   Code Status: Full code   Family Communication: Son at bedside  Disposition Plan: Remain inpatient-home in the next 2 days or so-once cleared by general surgery  Antimicrobial agents: Anti-infectives (From admission, onward)   Start     Dose/Rate Route Frequency Ordered Stop   11/04/17 1500  piperacillin-tazobactam (ZOSYN) IVPB 3.375 g     3.375 g 12.5 mL/hr over 240 Minutes Intravenous Every 8 hours 11/04/17 0834     11/04/17 0730  piperacillin-tazobactam (ZOSYN) IVPB 3.375 g     3.375 g 100 mL/hr over 30 Minutes Intravenous  Once 11/04/17 3903 11/04/17 1035      Procedures: 2/25>> CT guided percutaneous drain placement  CONSULTS:  general surgery and ir  Time spent: 35 minutes-Greater than 50% of this time was spent in counseling, explanation of diagnosis, planning of further management, and coordination of care.  MEDICATIONS: Scheduled Meds: . amLODipine  5 mg Oral Daily  . aspirin EC  81 mg Oral Daily  . atorvastatin  40 mg Oral q1800  . carvedilol  12.5 mg Oral BID WC  . clopidogrel  75 mg Oral Daily  . enoxaparin (LOVENOX) injection  40 mg Subcutaneous Q24H  . fentaNYL  50 mcg Transdermal Q72H  . insulin aspart  0-9 Units Subcutaneous Q4H  . isosorbide mononitrate  120 mg Oral Daily  . pantoprazole  40 mg Oral QAC breakfast  . pregabalin  100 mg Oral TID  . sodium chloride flush  5 mL Intracatheter Q8H  . tamsulosin  0.4 mg Oral  Daily   Continuous Infusions: . sodium chloride 50 mL/hr at 11/07/17 1026  . piperacillin-tazobactam (ZOSYN)  IV Stopped (11/07/17 1025)   PRN Meds:.acetaminophen **OR** acetaminophen, hydrALAZINE, HYDROmorphone (DILAUDID) injection, linaclotide, ondansetron **OR** ondansetron (ZOFRAN) IV,  oxyCODONE-acetaminophen   PHYSICAL EXAM: Vital signs: Vitals:   11/07/17 0431 11/07/17 0437 11/07/17 0833 11/07/17 1000  BP: (!) 150/62  (!) 173/73 (!) 177/70  Pulse: 62  64   Resp: 16     Temp: 98.8 F (37.1 C)     TempSrc: Oral     SpO2: 99%     Weight:  119.8 kg (264 lb 1.6 oz)    Height:       Filed Weights   11/05/17 0542 11/06/17 0451 11/07/17 0437  Weight: 116 kg (255 lb 11.7 oz) 119.8 kg (264 lb 1.8 oz) 119.8 kg (264 lb 1.6 oz)   Body mass index is 33.91 kg/m.   General appearance :Awake, alert, not in any distress.  Eyes:, pupils equally reactive to light and accomodation,no scleral icterus. HEENT: Atraumatic and Normocephalic Neck: supple, no JVD. Resp:Good air entry bilaterally, no rales or rhonchi CVS: S1 S2 regular, no murmurs.  GI: Bowel sounds present, mild tenderness mostly in the lower abdomen.  Extremities: Right BKA, left brace in place in the left leg  Neurology:  speech clear,Non focal, sensation is grossly intact. Psychiatric: Normal judgment and insight. Normal mood. Musculoskeletal:No digital cyanosis Skin:No Rash, warm and dry Wounds:N/A  I have personally reviewed following labs and imaging studies  LABORATORY DATA: CBC: Recent Labs  Lab 11/04/17 0541 11/05/17 0707 11/07/17 0706  WBC 6.6 6.9 6.7  NEUTROABS 4.6  --   --   HGB 10.6* 8.9* 8.3*  HCT 31.3* 26.1* 24.5*  MCV 80.7 82.1 82.8  PLT 265 202 967    Basic Metabolic Panel: Recent Labs  Lab 11/04/17 0541 11/05/17 0707 11/07/17 0706  NA 139 137 139  K 3.3* 3.5 3.6  CL 106 104 105  CO2 24 24 25   GLUCOSE 105* 102* 221*  BUN 7 7 10   CREATININE 1.20 1.50* 1.73*  CALCIUM 8.5* 7.8* 8.0*    GFR: Estimated Creatinine Clearance: 60.1 mL/min (A) (by C-G formula based on SCr of 1.73 mg/dL (H)).  Liver Function Tests: Recent Labs  Lab 11/04/17 0541 11/05/17 0707  AST 26 13*  ALT 13* 11*  ALKPHOS 106 86  BILITOT 0.5 0.5  PROT 6.1* 4.9*  ALBUMIN 3.0* 2.4*   No results  for input(s): LIPASE, AMYLASE in the last 168 hours. No results for input(s): AMMONIA in the last 168 hours.  Coagulation Profile: Recent Labs  Lab 11/04/17 0910  INR 1.05    Cardiac Enzymes: No results for input(s): CKTOTAL, CKMB, CKMBINDEX, TROPONINI in the last 168 hours.  BNP (last 3 results) No results for input(s): PROBNP in the last 8760 hours.  HbA1C: No results for input(s): HGBA1C in the last 72 hours.  CBG: Recent Labs  Lab 11/06/17 1603 11/06/17 2023 11/07/17 0001 11/07/17 0427 11/07/17 0826  GLUCAP 210* 319* 174* 178* 266*    Lipid Profile: No results for input(s): CHOL, HDL, LDLCALC, TRIG, CHOLHDL, LDLDIRECT in the last 72 hours.  Thyroid Function Tests: No results for input(s): TSH, T4TOTAL, FREET4, T3FREE, THYROIDAB in the last 72 hours.  Anemia Panel: No results for input(s): VITAMINB12, FOLATE, FERRITIN, TIBC, IRON, RETICCTPCT in the last 72 hours.  Urine analysis:    Component Value Date/Time   COLORURINE COLORLESS (A) 11/04/2017 0540   APPEARANCEUR CLEAR 11/04/2017  0540   LABSPEC 1.003 (L) 11/04/2017 0540   PHURINE 7.0 11/04/2017 0540   GLUCOSEU 50 (A) 11/04/2017 0540   HGBUR NEGATIVE 11/04/2017 0540   BILIRUBINUR NEGATIVE 11/04/2017 0540   KETONESUR NEGATIVE 11/04/2017 0540   PROTEINUR 100 (A) 11/04/2017 0540   UROBILINOGEN 0.2 01/14/2015 0346   NITRITE NEGATIVE 11/04/2017 0540   LEUKOCYTESUR NEGATIVE 11/04/2017 0540    Sepsis Labs: Lactic Acid, Venous    Component Value Date/Time   LATICACIDVEN 1.53 11/04/2017 0758    MICROBIOLOGY: Recent Results (from the past 240 hour(s))  Blood culture (routine x 2)     Status: None (Preliminary result)   Collection Time: 11/04/17  7:43 AM  Result Value Ref Range Status   Specimen Description BLOOD RIGHT ANTECUBITAL  Final   Special Requests   Final    BOTTLES DRAWN AEROBIC AND ANAEROBIC Blood Culture adequate volume   Culture   Final    NO GROWTH 2 DAYS Performed at [facility], [place] [address] [place], [place] [address]    Report Status PENDING  Incomplete  Blood culture (routine x 2)     Status: None (Preliminary result)   Collection Time: 11/04/17  8:54 AM  Result Value Ref Range Status   Specimen Description BLOOD RIGHT FOREARM  Final   Special Requests   Final    BOTTLES DRAWN AEROBIC AND ANAEROBIC Blood Culture adequate volume   Culture  Setup Time   Final    GRAM NEGATIVE RODS AEROBIC BOTTLE ONLY CRITICAL RESULT CALLED TO, READ BACK BY AND VERIFIED WITH: J [facility] 11/07/17 1202 JDW    Culture   Final    GRAM NEGATIVE RODS TOO YOUNG TO READ Performed at [facility], [place] [address] [place], [place] [address]    Report Status PENDING  Incomplete  Blood Culture ID Panel (Reflexed)     Status: None   Collection Time: 11/04/17  8:54 AM  Result Value Ref Range Status   Enterococcus species NOT DETECTED NOT DETECTED Final   Listeria monocytogenes NOT DETECTED NOT DETECTED Final    Comment: CRITICAL RESULT CALLED TO, READ BACK BY AND VERIFIED WITH: J [facility] PHARMD 11/07/17 0002 JDW    Staphylococcus species NOT DETECTED NOT DETECTED Final   Staphylococcus aureus NOT DETECTED NOT DETECTED Final   Streptococcus species NOT DETECTED NOT DETECTED Final   Streptococcus agalactiae NOT DETECTED NOT DETECTED Final   Streptococcus pneumoniae NOT DETECTED NOT DETECTED Final   Streptococcus pyogenes NOT DETECTED NOT DETECTED Final   Acinetobacter baumannii NOT DETECTED NOT DETECTED Final   Enterobacteriaceae species NOT DETECTED NOT DETECTED Final   Enterobacter cloacae complex NOT DETECTED NOT DETECTED Final   Escherichia coli NOT DETECTED NOT DETECTED Final   Klebsiella oxytoca NOT DETECTED NOT DETECTED Final   Klebsiella pneumoniae NOT DETECTED NOT DETECTED Final   Proteus species NOT DETECTED NOT DETECTED Final   Serratia marcescens NOT DETECTED NOT DETECTED Final   Haemophilus influenzae NOT DETECTED NOT DETECTED Final   Neisseria  meningitidis NOT DETECTED NOT DETECTED Final   Pseudomonas aeruginosa NOT DETECTED NOT DETECTED Final   Candida albicans NOT DETECTED NOT DETECTED Final   Candida glabrata NOT DETECTED NOT DETECTED Final   Candida krusei NOT DETECTED NOT DETECTED Final   Candida parapsilosis NOT DETECTED NOT DETECTED Final   Candida tropicalis NOT DETECTED NOT DETECTED Final    Comment: Performed at [facility], [place]. [address] [place], [place] [address]  Aerobic/Anaerobic Culture (surgical/deep  wound)     Status: Abnormal   Collection Time: 11/04/17 12:40 PM  Result Value Ref Range Status   Specimen Description ABSCESS  Final   Special Requests NONE  Final   Gram Stain   Final    ABUNDANT WBC PRESENT, PREDOMINANTLY PMN MODERATE GRAM NEGATIVE RODS MODERATE GRAM POSITIVE COCCI Performed at [facility], [address] [place], [place] [address]    Culture (A)  Final    MULTIPLE ORGANISMS PRESENT, NONE PREDOMINANT MIXED ANAEROBIC FLORA PRESENT.  CALL LAB IF FURTHER IID REQUIRED.    Report Status 11/06/2017 FINAL  Final  MRSA PCR Screening     Status: Abnormal   Collection Time: 11/04/17 10:26 PM  Result Value Ref Range Status   MRSA by PCR POSITIVE (A) NEGATIVE Final    Comment:        The GeneXpert MRSA Assay (FDA approved for NASAL specimens only), is one component of a comprehensive MRSA colonization surveillance program. It is not intended to diagnose MRSA infection nor to guide or monitor treatment for MRSA infections. CRITICAL RESULT CALLED TO, READ BACK BY AND VERIFIED WITH: Mellissa Kohut RN 11/05/17 0300 JDW Performed at [facility], [address], [place],  [address]     RADIOLOGY STUDIES/RESULTS: Ct Abdomen Pelvis Wo Contrast  Result Date: 10/31/2017 CLINICAL DATA:  Abdominal pain and swelling for 4 weeks. EXAM: CT ABDOMEN AND PELVIS WITHOUT CONTRAST TECHNIQUE: Multidetector CT imaging of the abdomen and pelvis was performed following the standard protocol  without IV contrast. COMPARISON:  None. FINDINGS: Lower chest: The lung bases are clear. No pleural effusion or worrisome pulmonary lesions. The heart is normal in size. Coronary artery calcifications are noted. The distal esophagus is grossly normal. Hepatobiliary: No focal hepatic lesions or intrahepatic biliary dilatation. The gallbladder is surgically absent. Mild associated common bile duct dilatation. Pancreas: No mass, inflammation or ductal dilatation. Spleen: Normal size.  No focal lesions. Adrenals/Urinary Tract: The adrenal glands and kidneys are unremarkable. Small bilateral renal calculi but no obstructing ureteral calculi or bladder calculi. Stomach/Bowel: The stomach, duodenum and small bowel are unremarkable. No acute inflammatory changes, mass lesions or obstructive findings. There is an inflammatory process in the right pelvis with a 5.4 x 4.7 cm abscess. This is located between the cecal tip/terminal ileum and the sigmoid colon. Although this could be ruptured appendicitis I think it is more likely a diverticular abscess extending off of the right wall of the sigmoid colon. There is underlying sigmoid diverticulosis and on the coronal images there appears to be some air connecting the abscess with the colon. Significant surrounding inflammatory phlegmon involving the dome region of the bladder. Vascular/Lymphatic: Moderate atherosclerotic calcifications involving the aorta and iliac arteries. Scattered borderline mesenteric and retroperitoneal lymph nodes likely inflammatory/hyperplastic. No pelvic adenopathy. Borderline enlarged inguinal lymph nodes. Reproductive: The prostate gland and seminal vesicles are grossly normal. A penile prosthesis is noted. Other: No abdominal wall hernia or subcutaneous lesions. No ascites. Musculoskeletal: No significant bony findings. Degenerative changes involving the spine and spinal fusion hardware noted. IMPRESSION: 1. 5 cm right-sided pelvic abscess most  likely related to sigmoid diverticulitis. It is possible this is ruptured appendicitis if the patient still has his appendix. I think that is less likely. There is significant surrounding inflammatory phlegmon involving the terminal ileum and also the dome of the bladder. 2. Small bilateral renal calculi but no obstructing ureteral calculi or bladder calculi. 3. Status post cholecystectomy.  No biliary dilatation. These results  will be called to the ordering clinician or representative by the Radiologist Assistant, and communication documented in the PACS or zVision Dashboard. Electronically Signed   By: Marijo Sanes M.D.   On: 10/31/2017 16:41   Ct Image Guided Drainage By Percutaneous Catheter  Result Date: 11/04/2017 INDICATION: 61 year old male with a history of lower abdominal abscess secondary to diverticulitis EXAM: CT-GUIDED DRAINAGE ABDOMINAL ABSCESS MEDICATIONS: The patient is currently admitted to the hospital and receiving intravenous antibiotics. The antibiotics were administered within an appropriate time frame prior to the initiation of the procedure. ANESTHESIA/SEDATION: 1.5 mg IV Versed 75 mcg IV Fentanyl Moderate Sedation Time:  13 minutes The patient was continuously monitored during the procedure by the interventional radiology nurse under my direct supervision. COMPLICATIONS: None TECHNIQUE: Informed written consent was obtained from the patient after a thorough discussion of the procedural risks, benefits and alternatives. All questions were addressed. Maximal Sterile Barrier Technique was utilized including caps, mask, sterile gowns, sterile gloves, sterile drape, hand hygiene and skin antiseptic. A timeout was performed prior to the initiation of the procedure. PROCEDURE: The operative field was prepped with Chlorhexidine in a sterile fashion, and a sterile drape was applied covering the operative field. A sterile gown and sterile gloves were used for the procedure. Local anesthesia was  provided with 1% Lidocaine. Once the patient is prepped and draped in the usual sterile fashion, 1% lidocaine was used for local anesthesia. Using modified Seldinger technique, a 10 French drain was placed in the abscess cavity within the lower abdomen. Proximally 25 cc of purulent in slightly bloody fluid aspirated. Sample was sent for culture. Catheter was sutured in position and attached to bulb suction. Patient tolerated the procedure well and remained hemodynamically stable throughout. No complications were encountered and no significant blood loss. FINDINGS: Scout CT demonstrates abscess within the lower abdomen, just superior to the urinary bladder and associated with sigmoid colon and the sigmoid colon inflammatory changes. Status post drainage there is a 10 French catheter within the abscess cavity which is collapsed. IMPRESSION: Status post CT-guided drainage of abdominal abscess. Signed, Dulcy Fanny. Earleen Newport, DO [facility] Radiology Electronically Signed   By: Corrie Mckusick D.O.   On: 11/04/2017 15:09     LOS: 3 days   Oren Binet, MD  [facility] Pager:[phone]  If 7PM-7AM, please contact night-coverage [email or web address] Password [facility] 11/07/2017, 12:10 PM

## 2017-11-08 ENCOUNTER — Encounter (HOSPITAL_COMMUNITY): Payer: Self-pay

## 2017-11-08 DIAGNOSIS — I10 Essential (primary) hypertension: Secondary | ICD-10-CM

## 2017-11-08 LAB — BASIC METABOLIC PANEL
Anion gap: 10 (ref 5–15)
BUN: 9 mg/dL (ref 6–20)
CO2: 24 mmol/L (ref 22–32)
Calcium: 8 mg/dL — ABNORMAL LOW (ref 8.9–10.3)
Chloride: 102 mmol/L (ref 101–111)
Creatinine, Ser: 1.68 mg/dL — ABNORMAL HIGH (ref 0.61–1.24)
GFR calc Af Amer: 48 mL/min — ABNORMAL LOW (ref >60)
GFR calc non Af Amer: 42 mL/min — ABNORMAL LOW (ref >60)
Glucose, Bld: 242 mg/dL — ABNORMAL HIGH (ref 65–99)
Potassium: 3.5 mmol/L (ref 3.5–5.1)
Sodium: 136 mmol/L (ref 135–145)

## 2017-11-08 LAB — CBC
HCT: 25.6 % — ABNORMAL LOW (ref 39.0–52.0)
Hemoglobin: 8.6 g/dL — ABNORMAL LOW (ref 13.0–17.0)
MCH: 27.7 pg (ref 26.0–34.0)
MCHC: 33.6 g/dL (ref 30.0–36.0)
MCV: 82.6 fL (ref 78.0–100.0)
Platelets: 189 10*3/uL (ref 150–400)
RBC: 3.1 MIL/uL — ABNORMAL LOW (ref 4.22–5.81)
RDW: 14.1 % (ref 11.5–15.5)
WBC: 6.5 10*3/uL (ref 4.0–10.5)

## 2017-11-08 LAB — GLUCOSE, CAPILLARY
Glucose-Capillary: 196 mg/dL — ABNORMAL HIGH (ref 65–99)
Glucose-Capillary: 214 mg/dL — ABNORMAL HIGH (ref 65–99)
Glucose-Capillary: 265 mg/dL — ABNORMAL HIGH (ref 65–99)
Glucose-Capillary: 309 mg/dL — ABNORMAL HIGH (ref 65–99)
Glucose-Capillary: 314 mg/dL — ABNORMAL HIGH (ref 65–99)

## 2017-11-08 MED ORDER — CARVEDILOL 25 MG PO TABS
25.0000 mg | ORAL_TABLET | Freq: Two times a day (BID) | ORAL | Status: DC
  Administered 2017-11-08 – 2017-11-10 (×5): 25 mg via ORAL
  Filled 2017-11-08 (×5): qty 1

## 2017-11-08 MED ORDER — INSULIN GLARGINE 100 UNIT/ML ~~LOC~~ SOLN: 50.0000 [IU] | Freq: Every day | SUBCUTANEOUS | Status: DC

## 2017-11-08 MED ORDER — HYDRALAZINE HCL 20 MG/ML IJ SOLN: 10.0000 mg | INTRAMUSCULAR | Status: DC | PRN

## 2017-11-08 MED ORDER — INSULIN GLARGINE 100 UNIT/ML ~~LOC~~ SOLN
40.0000 [IU] | Freq: Every day | SUBCUTANEOUS | Status: DC
  Administered 2017-11-08 – 2017-11-09 (×2): 40 [IU] via SUBCUTANEOUS
  Filled 2017-11-08 (×3): qty 0.4

## 2017-11-08 MED ORDER — INSULIN GLARGINE 100 UNIT/ML ~~LOC~~ SOLN: 30.0000 [IU] | Freq: Every day | SUBCUTANEOUS | Status: DC

## 2017-11-08 MED ORDER — PREGABALIN 100 MG PO CAPS
100.0000 mg | ORAL_CAPSULE | Freq: Three times a day (TID) | ORAL | Status: DC
  Administered 2017-11-08 – 2017-11-10 (×8): 100 mg via ORAL
  Filled 2017-11-08 (×8): qty 1

## 2017-11-08 MED ORDER — AMLODIPINE BESYLATE 10 MG PO TABS
10.0000 mg | ORAL_TABLET | Freq: Every day | ORAL | Status: DC
  Administered 2017-11-08 – 2017-11-10 (×3): 10 mg via ORAL
  Filled 2017-11-08 (×3): qty 1

## 2017-11-08 MED ORDER — SODIUM CHLORIDE 0.9 % IV SOLN
3.0000 g | Freq: Four times a day (QID) | INTRAVENOUS | Status: DC
  Administered 2017-11-08 – 2017-11-10 (×8): 3 g via INTRAVENOUS
  Filled 2017-11-08 (×10): qty 3

## 2017-11-08 NOTE — Progress Notes (Signed)
   Subjective/Chief Complaint: Tolerated diet yesterday without aggravation of pain. Drain continues to function well. He is frustrated today because he is not receiving his nighttime Lantus or his usual Lyrica dose or his metformin.  Objective: Vital signs in last 24 hours: Temp:  [97 F (36.1 C)-98.9 F (37.2 C)] 98.2 F (36.8 C) (03/01 0426) Pulse Rate:  [63-82] 65 (03/01 1045) Resp:  [18-20] 18 (03/01 0426) BP: (161-198)/(65-82) 170/75 (03/01 1045) SpO2:  [98 %-100 %] 100 % (03/01 0426) Weight:  [119.6 kg (263 lb 9.6 oz)] 119.6 kg (263 lb 9.6 oz) (03/01 0423) Last BM Date: 11/07/17  Intake/Output from previous day: 02/28 0701 - 03/01 0700 In: 1308.3 [P.O.:960; I.V.:343.3] Out: 4385 [Urine:4325; Drains:60] Intake/Output this shift: Total I/O In: 720 [P.O.:700; Other:20] Out: 8502 [Urine:1450]  General appearance: cooperative Resp: clear to auscultation bilaterally Cardio: regular rate and rhythm GI: soft, tender lower mid abdomen without peritonitis  Drain output purulent  Lab Results:  Recent Labs    11/07/17 0706 11/08/17 0626  WBC 6.7 6.5  HGB 8.3* 8.6*  HCT 24.5* 25.6*  PLT 171 189   BMET Recent Labs    11/07/17 0706 11/08/17 0626  NA 139 136  K 3.6 3.5  CL 105 102  CO2 25 24  GLUCOSE 221* 242*  BUN 10 9  CREATININE 1.73* 1.68*  CALCIUM 8.0* 8.0*   PT/INR No results for input(s): LABPROT, INR in the last 72 hours. ABG No results for input(s): PHART, HCO3 in the last 72 hours.  Invalid input(s): PCO2, PO2  Studies/Results: No results found.  Anti-infectives: Anti-infectives (From admission, onward)   Start     Dose/Rate Route Frequency Ordered Stop   11/08/17 1400  Ampicillin-Sulbactam (UNASYN) 3 g in sodium chloride 0.9 % 100 mL IVPB     3 g 200 mL/hr over 30 Minutes Intravenous Every 6 hours 11/08/17 0940     11/04/17 1500  piperacillin-tazobactam (ZOSYN) IVPB 3.375 g  Status:  Discontinued     3.375 g 12.5 mL/hr over 240 Minutes  Intravenous Every 8 hours 11/04/17 0834 11/08/17 0940   11/04/17 0730  piperacillin-tazobactam (ZOSYN) IVPB 3.375 g     3.375 g 100 mL/hr over 30 Minutes Intravenous  Once 11/04/17 7741 11/04/17 1035      Assessment/Plan: Sigmoid diverticulitis - S/P perc drain. Continue antibiotics, advance diet as tolerated. If pain remains minimal on regular diet, okay for discharge from surgery standpoint. Please continue on oral antibiotics for 1 week after discharge  LOS: 4 days    [name] 11/08/2017

## 2017-11-08 NOTE — Progress Notes (Signed)
Referring Physician(s): Dr Sloan Leiter  Supervising Physician: Corrie Mckusick  Patient Status:  [facility] - In-pt  Chief Complaint:  Divertic abscess drain placed 2/25  Subjective:  OP still very purulent 60 cc yesterday Mult orgs Pt is up in bed; up to bathroom  BC:  Culture Setup Time GRAM NEGATIVE RODS  AEROBIC BOTTLE ONLY        Allergies: Ivp dye [iodinated diagnostic agents] and Adhesive [tape]  Medications: Prior to Admission medications   Medication Sig Start Date End Date Taking? Authorizing Provider  amLODipine-olmesartan (AZOR) 5-20 MG tablet Take 2 tablets by mouth every evening. 08/01/16  Yes Adrian Prows, MD  aspirin EC 81 MG tablet Take 81 mg by mouth daily.   Yes [provider]  atorvastatin (LIPITOR) 40 MG tablet Take 40 mg by mouth daily.   Yes [provider]  carvedilol (COREG) 25 MG tablet Take 1.5 tablets (37.5 mg total) by mouth 2 (two) times daily. 08/01/16  Yes Adrian Prows, MD  clobetasol (TEMOVATE) 0.05 % external solution Apply 1 application topically daily as needed (dry scalp).   Yes [provider]  clopidogrel (PLAVIX) 75 MG tablet Take 75 mg by mouth daily.  03/28/12  Yes [provider]  clotrimazole-betamethasone (LOTRISONE) cream Apply 1 application topically daily as needed (dry skin - beard).    Yes [provider]  collagenase (SANTYL) ointment Apply topically daily. Patient taking differently: Apply 1 application topically daily. TO AFFECTED SITE(S) 09/06/15  Yes Lavina Hamman, MD  fentaNYL (DURAGESIC - DOSED MCG/HR) 50 MCG/HR Place 1 patch (50 mcg total) onto the skin every 3 (three) days. 05/10/16  Yes Tat, Shanon Brow, MD  fluticasone (CUTIVATE) 0.05 % cream Apply 1 application topically 2 (two) times daily as needed (dry skin on face).    Yes [provider]  glyBURIDE-metformin (GLUCOVANCE) 2.5-500 MG per tablet Take 2 tablets by mouth 2 (two) times daily with a meal.  03/05/12  Yes  [provider]  HYDROmorphone (DILAUDID) 2 MG tablet Take 1 tablet (2 mg total) by mouth at bedtime. Patient taking differently: Take 2 mg by mouth at bedtime as needed (for breakthrough pain).  05/10/16  Yes Tat, Shanon Brow, MD  insulin glargine (LANTUS) 100 UNIT/ML injection Inject 0.6 mLs (60 Units total) into the skin at bedtime. Patient taking differently: Inject 50 Units into the skin at bedtime.  05/11/16  Yes Tat, Shanon Brow, MD  Insulin Glulisine (APIDRA) 100 UNIT/ML Solostar Pen Inject 13 Units into the skin 3 (three) times daily with meals. Patient taking differently: Inject 15-18 Units into the skin 3 (three) times daily as needed (for CBG). Sliding scale 05/11/16  Yes Tat, Shanon Brow, MD  isosorbide mononitrate (IMDUR) 120 MG 24 hr tablet Take 120 mg by mouth daily.   Yes [provider]  Linaclotide (LINZESS) 145 MCG CAPS capsule Take 1 capsule (145 mcg total) by mouth daily as needed. Patient taking differently: Take 145 mcg by mouth daily as needed (constipation).  01/20/15  Yes Florencia Reasons, MD  lisinopril (PRINIVIL,ZESTRIL) 10 MG tablet Take 10 mg by mouth daily.   Yes [provider]  Multiple Vitamin (MULTIVITAMIN WITH MINERALS) TABS tablet Take 1 tablet by mouth daily.   Yes [provider]  nitroGLYCERIN (NITRO-BID) 2 % ointment Apply 0.75 inches topically daily as needed for chest pain.    Yes [provider]  nitroGLYCERIN (NITRODUR - DOSED IN MG/24 HR) 0.2 mg/hr patch Place 1 patch (0.2 mg total) onto the skin daily.  01/08/17  Yes Suzan Slick, NP  nitroGLYCERIN (NITROSTAT) 0.4 MG SL tablet Place 1 tablet (0.4 mg total) under the tongue every 5 (five) minutes as needed (as needed for esophageal spasm). Patient taking differently: Place 0.4 mg under the tongue every 5 (five) minutes as needed for chest pain.  08/09/14  Yes Pyrtle, Lajuan Lines, MD  omega-3 acid ethyl esters (LOVAZA) 1 G capsule Take 2 g by mouth 2 (two) times daily.   Yes [provider]   ondansetron (ZOFRAN-ODT) 4 MG disintegrating tablet Take 4 mg by mouth 4 (four) times daily as needed for nausea.   Yes [provider]  oxyCODONE-acetaminophen (PERCOCET/ROXICET) 5-325 MG tablet Take 1 tablet by mouth every 8 (eight) hours as needed. Patient taking differently: Take 1 tablet by mouth 2 (two) times daily as needed for moderate pain.  05/10/16  Yes Tat, Shanon Brow, MD  pantoprazole (PROTONIX) 40 MG tablet Take 1 tablet (40 mg total) by mouth daily before breakfast. 10/30/14  Yes Adrian Prows, MD  pregabalin (LYRICA) 100 MG capsule Take 1 capsule (100 mg total) by mouth 3 (three) times daily. 05/10/16  Yes Tat, Shanon Brow, MD  Probiotic CAPS Take 2 capsules by mouth daily after breakfast.   Yes [provider]  Propylene Glycol (SYSTANE BALANCE OP) Place 2-3 drops into both eyes daily as needed (for dry eyes).   Yes [provider]  rOPINIRole (REQUIP) 2 MG tablet Take 2 mg by mouth at bedtime.    Yes [provider]  Tamsulosin HCl (FLOMAX) 0.4 MG CAPS Take 0.4 mg by mouth daily.   Yes [provider]  torsemide (DEMADEX) 20 MG tablet Take 20 mg by mouth every morning. MAY TAKE AN EXTRA 20 MG ONCE A DAY FOR EXCESSIVE FLUID RETENTION   Yes [provider]     Vital Signs: BP (!) 198/82 (BP Location: Left Arm)   Pulse 63   Temp 98.2 F (36.8 C) (Oral)   Resp 18   Ht 6\' 2"  (1.88 m)   Wt 263 lb 9.6 oz (119.6 kg)   SpO2 100%   BMI 33.84 kg/m   Physical Exam  Constitutional: He is oriented to person, place, and time.  Abdominal: Normal appearance and bowel sounds are normal. There is no tenderness.  Neurological: He is alert and oriented to person, place, and time.  Skin: Skin is warm and dry.  Site is clean and dry Sl tender OP purulent 60 cc yesterday  BC: aerobic +   Nursing note and vitals reviewed.   Imaging: Ct Image Guided Drainage By Percutaneous Catheter  Result Date: 11/04/2017 INDICATION: 62 year old male with a  history of lower abdominal abscess secondary to diverticulitis EXAM: CT-GUIDED DRAINAGE ABDOMINAL ABSCESS MEDICATIONS: The patient is currently admitted to the hospital and receiving intravenous antibiotics. The antibiotics were administered within an appropriate time frame prior to the initiation of the procedure. ANESTHESIA/SEDATION: 1.5 mg IV Versed 75 mcg IV Fentanyl Moderate Sedation Time:  13 minutes The patient was continuously monitored during the procedure by the interventional radiology nurse under my direct supervision. COMPLICATIONS: None TECHNIQUE: Informed written consent was obtained from the patient after a thorough discussion of the procedural risks, benefits and alternatives. All questions were addressed. Maximal Sterile Barrier Technique was utilized including caps, mask, sterile gowns, sterile gloves, sterile drape, hand hygiene and skin antiseptic. A timeout was performed prior to the initiation of the procedure. PROCEDURE: The operative field was prepped with Chlorhexidine in a sterile fashion, and a  sterile drape was applied covering the operative field. A sterile gown and sterile gloves were used for the procedure. Local anesthesia was provided with 1% Lidocaine. Once the patient is prepped and draped in the usual sterile fashion, 1% lidocaine was used for local anesthesia. Using modified Seldinger technique, a 10 French drain was placed in the abscess cavity within the lower abdomen. Proximally 25 cc of purulent in slightly bloody fluid aspirated. Sample was sent for culture. Catheter was sutured in position and attached to bulb suction. Patient tolerated the procedure well and remained hemodynamically stable throughout. No complications were encountered and no significant blood loss. FINDINGS: Scout CT demonstrates abscess within the lower abdomen, just superior to the urinary bladder and associated with sigmoid colon and the sigmoid colon inflammatory changes. Status post drainage there is  a 10 French catheter within the abscess cavity which is collapsed. IMPRESSION: Status post CT-guided drainage of abdominal abscess. Signed, Dulcy Fanny. Earleen Newport, DO [facility] Electronically Signed   By: Corrie Mckusick D.O.   On: 11/04/2017 15:09    Labs:  CBC: Recent Labs    11/04/17 0541 11/05/17 0707 11/07/17 0706 11/08/17 0626  WBC 6.6 6.9 6.7 6.5  HGB 10.6* 8.9* 8.3* 8.6*  HCT 31.3* 26.1* 24.5* 25.6*  PLT 265 202 171 189    COAGS: Recent Labs    11/04/17 0910  INR 1.05    BMP: Recent Labs    11/04/17 0541 11/05/17 0707 11/07/17 0706 11/08/17 0626  NA 139 137 139 136  K 3.3* 3.5 3.6 3.5  CL 106 104 105 102  CO2 24 24 25 24   GLUCOSE 105* 102* 221* 242*  BUN 7 7 10 9   CALCIUM 8.5* 7.8* 8.0* 8.0*  CREATININE 1.20 1.50* 1.73* 1.68*  GFRNONAA >60 48* 40* 42*  GFRAA >60 55* 47* 48*    LIVER FUNCTION TESTS: Recent Labs    11/04/17 0541 11/05/17 0707  BILITOT 0.5 0.5  AST 26 13*  ALT 13* 11*  ALKPHOS 106 86  PROT 6.1* 4.9*  ALBUMIN 3.0* 2.4*    Assessment and Plan:  Divertic drain intact OP significant Will need 5 cc flush daily at home IR [facility] will call pt for OP follow up time and date  Electronically Signed: Earlie Arciga A, PA-C 11/08/2017, 9:34 AM   I spent a total of 15 Minutes at the the patient's bedside AND on the patient's hospital floor or unit, greater than 50% of which was counseling/coordinating care for diverticular absc drain

## 2017-11-08 NOTE — Progress Notes (Signed)
PROGRESS NOTE        PATIENT DETAILS Name: [name] Age: 62 y.o. Sex: male Date of Birth: 1956/07/11 Admit Date: 11/04/2017 Admitting Physician Karmen Bongo, MD FFM:[id number], Thayer Jew, MD  Brief Narrative: Patient is a 62 y.o. male with history of CAD status post PCI 5993, chronic diastolic heart failure, DM-2 who presented with abdominal pain, found to have 5 cm right-sided pelvic abscess, general surgery and interventional radiology were consulted, he underwent percutaneous drainage-and remains on empiric antimicrobial therapy.  Slowly improving with above-noted care.  See below for further details.  Subjective: No further diarrhea-pain is relatively well controlled.  Assessment/Plan: Pelvic abscess with gram-negative bacteremia: Secondary to diverticulitis with microperforation-underwent drain placement on 2/25.  Abscess culture positive for mixed flora, blood cultures positive for gram-negative bacteria-awaiting final ID.  Diet slowly being advanced by general surgery.  Will narrow antimicrobial spectrum to just Unasyn.  Await definite culture results, and further recommendations from general surgery and interventional radiology.  Acute kidney injury on chronic kidney disease stage III: Likely hemodynamically mediated in the setting of bacteremia and diarrhea.  Started on IV fluids-with some improvement in creatinine.  Suspect will continue to improve with current supportive care.  Continue to avoid nephrotoxic agents.  Repeat electrolytes tomorrow.    Diarrhea: Suspect related to antimicrobial therapy-diarrhea has essentially resolved-we will switch over to Unasyn.  Doubt C. difficile colitis at this time-hence stool workup not sent.   CAD status post PCI in 2016: Currently no anginal symptoms-continue antiplatelet agents  Chronic diastolic heart failure: Compensated-hold diuretics given acute kidney injury  Hypokalemia: Repleted  DM-2: CBGs creeping  up-now that diet is stable-resume Lantus-we will start at a slightly lower dose of 40 units daily.  Hypertension: Uncontrolled-have increased Coreg to 25 mg twice daily, amlodipine to 10 mg daily, continue current dosing of Imdur.  Will follow and adjust accordingly.    Chronic pain syndrome: Continue transdermal fentanyl-and Dilaudid for breakthrough pain.    GERD: Continue PPI  TTS:[id number] Flomax  History of right below-knee amputation  DVT Prophylaxis: Prophylactic Lovenox   Code Status: Full code   Family Communication: Son at bedside  Disposition Plan: Remain inpatient-home in the next 2 days or so-once cleared by general surgery  Antimicrobial agents: Anti-infectives (From admission, onward)   Start     Dose/Rate Route Frequency Ordered Stop   11/08/17 1400  Ampicillin-Sulbactam (UNASYN) 3 g in sodium chloride 0.9 % 100 mL IVPB     3 g 200 mL/hr over 30 Minutes Intravenous Every 6 hours 11/08/17 0940     11/04/17 1500  piperacillin-tazobactam (ZOSYN) IVPB 3.375 g  Status:  Discontinued     3.375 g 12.5 mL/hr over 240 Minutes Intravenous Every 8 hours 11/04/17 0834 11/08/17 0940   11/04/17 0730  piperacillin-tazobactam (ZOSYN) IVPB 3.375 g     3.375 g 100 mL/hr over 30 Minutes Intravenous  Once 11/04/17 0092 11/04/17 1035      Procedures: 2/25>> CT guided percutaneous drain placement  CONSULTS:  general surgery and ir  Time spent: 25 minutes-Greater than 50% of this time was spent in counseling, explanation of diagnosis, planning of further management, and coordination of care.  MEDICATIONS: Scheduled Meds: . amLODipine  10 mg Oral Daily  . aspirin EC  81 mg Oral Daily  . atorvastatin  40 mg Oral q1800  . carvedilol  25  mg Oral BID WC  . clopidogrel  75 mg Oral Daily  . enoxaparin (LOVENOX) injection  40 mg Subcutaneous Q24H  . fentaNYL  50 mcg Transdermal Q72H  . insulin aspart  0-9 Units Subcutaneous Q4H  . insulin glargine  50 Units Subcutaneous QHS    . isosorbide mononitrate  120 mg Oral Daily  . pantoprazole  40 mg Oral QAC breakfast  . pregabalin  100 mg Oral TID  . sodium chloride flush  5 mL Intracatheter Q8H  . tamsulosin  0.4 mg Oral Daily   Continuous Infusions: . sodium chloride 50 mL/hr at 11/07/17 1529  . ampicillin-sulbactam (UNASYN) IV     PRN Meds:.acetaminophen **OR** acetaminophen, hydrALAZINE, HYDROmorphone (DILAUDID) injection, linaclotide, ondansetron **OR** ondansetron (ZOFRAN) IV, oxyCODONE-acetaminophen   PHYSICAL EXAM: Vital signs: Vitals:   11/07/17 2024 11/08/17 0423 11/08/17 0426 11/08/17 0833  BP: (!) 178/73  (!) 189/75 (!) 198/82  Pulse: 82  69 63  Resp: 18  18   Temp: (!) 97 F (36.1 C)  98.2 F (36.8 C)   TempSrc: Oral  Oral   SpO2: 99%  100%   Weight:  119.6 kg (263 lb 9.6 oz)    Height:       Filed Weights   11/06/17 0451 11/07/17 0437 11/08/17 0423  Weight: 119.8 kg (264 lb 1.8 oz) 119.8 kg (264 lb 1.6 oz) 119.6 kg (263 lb 9.6 oz)   Body mass index is 33.84 kg/m.   General appearance :Awake, alert, not in any distress.  Eyes:, pupils equally reactive to light and accomodation,no scleral icterus. HEENT: Atraumatic and Normocephalic Neck: supple, no JVD. Resp:Good air entry bilaterally, no rales or rhonchi CVS: S1 S2 regular, no murmurs.  GI: Bowel sounds present, some mild tenderness diffusely but no peritoneal signs.   Extremities: Right BKA, brace in the left leg.   Neurology:  speech clear,Non focal, sensation is grossly intact. Psychiatric: Normal judgment and insight. Normal mood. Musculoskeletal:No digital cyanosis Skin:No Rash, warm and dry Wounds:N/A  I have personally reviewed following labs and imaging studies  LABORATORY DATA: CBC: Recent Labs  Lab 11/04/17 0541 11/05/17 0707 11/07/17 0706 11/08/17 0626  WBC 6.6 6.9 6.7 6.5  NEUTROABS 4.6  --   --   --   HGB 10.6* 8.9* 8.3* 8.6*  HCT 31.3* 26.1* 24.5* 25.6*  MCV 80.7 82.1 82.8 82.6  PLT 265 202 171 189     Basic Metabolic Panel: Recent Labs  Lab 11/04/17 0541 11/05/17 0707 11/07/17 0706 11/08/17 0626  NA 139 137 139 136  K 3.3* 3.5 3.6 3.5  CL 106 104 105 102  CO2 24 24 25 24   GLUCOSE 105* 102* 221* 242*  BUN 7 7 10 9   CREATININE 1.20 1.50* 1.73* 1.68*  CALCIUM 8.5* 7.8* 8.0* 8.0*    GFR: Estimated Creatinine Clearance: 61.9 mL/min (A) (by C-G formula based on SCr of 1.68 mg/dL (H)).  Liver Function Tests: Recent Labs  Lab 11/04/17 0541 11/05/17 0707  AST 26 13*  ALT 13* 11*  ALKPHOS 106 86  BILITOT 0.5 0.5  PROT 6.1* 4.9*  ALBUMIN 3.0* 2.4*   No results for input(s): LIPASE, AMYLASE in the last 168 hours. No results for input(s): AMMONIA in the last 168 hours.  Coagulation Profile: Recent Labs  Lab 11/04/17 0910  INR 1.05    Cardiac Enzymes: No results for input(s): CKTOTAL, CKMB, CKMBINDEX, TROPONINI in the last 168 hours.  BNP (last 3 results) No results for input(s): PROBNP in the last  8760 hours.  HbA1C: No results for input(s): HGBA1C in the last 72 hours.  CBG: Recent Labs  Lab 11/07/17 1946 11/07/17 2023 11/08/17 0022 11/08/17 0414 11/08/17 0802  GLUCAP 357* 354* 314* 265* 196*    Lipid Profile: No results for input(s): CHOL, HDL, LDLCALC, TRIG, CHOLHDL, LDLDIRECT in the last 72 hours.  Thyroid Function Tests: No results for input(s): TSH, T4TOTAL, FREET4, T3FREE, THYROIDAB in the last 72 hours.  Anemia Panel: No results for input(s): VITAMINB12, FOLATE, FERRITIN, TIBC, IRON, RETICCTPCT in the last 72 hours.  Urine analysis:    Component Value Date/Time   COLORURINE COLORLESS (A) 11/04/2017 0540   APPEARANCEUR CLEAR 11/04/2017 0540   LABSPEC 1.003 (L) 11/04/2017 0540   PHURINE 7.0 11/04/2017 0540   GLUCOSEU 50 (A) 11/04/2017 0540   HGBUR NEGATIVE 11/04/2017 0540   BILIRUBINUR NEGATIVE 11/04/2017 0540   KETONESUR NEGATIVE 11/04/2017 0540   PROTEINUR 100 (A) 11/04/2017 0540   UROBILINOGEN 0.2 01/14/2015 0346   NITRITE  NEGATIVE 11/04/2017 0540   LEUKOCYTESUR NEGATIVE 11/04/2017 0540    Sepsis Labs: Lactic Acid, Venous    Component Value Date/Time   LATICACIDVEN 1.53 11/04/2017 0758    MICROBIOLOGY: Recent Results (from the past 240 hour(s))  Blood culture (routine x 2)     Status: None (Preliminary result)   Collection Time: 11/04/17  7:43 AM  Result Value Ref Range Status   Specimen Description BLOOD RIGHT ANTECUBITAL  Final   Special Requests   Final    BOTTLES DRAWN AEROBIC AND ANAEROBIC Blood Culture adequate volume   Culture   Final    NO GROWTH 3 DAYS Performed at [facility], [place] [address], [place], [place] [address]    Report Status PENDING  Incomplete  Blood culture (routine x 2)     Status: None (Preliminary result)   Collection Time: 11/04/17  8:54 AM  Result Value Ref Range Status   Specimen Description BLOOD RIGHT FOREARM  Final   Special Requests   Final    BOTTLES DRAWN AEROBIC AND ANAEROBIC Blood Culture adequate volume   Culture  Setup Time   Final    GRAM NEGATIVE RODS AEROBIC BOTTLE ONLY CRITICAL RESULT CALLED TO, READ BACK BY AND VERIFIED WITH: [facility] 11/07/17 1202 JDW    Culture   Final    GRAM NEGATIVE RODS TOO YOUNG TO READ Performed at [facility], [place] [address] [place], [place] [address]    Report Status PENDING  Incomplete  Blood Culture ID Panel (Reflexed)     Status: None   Collection Time: 11/04/17  8:54 AM  Result Value Ref Range Status   Enterococcus species NOT DETECTED NOT DETECTED Final   Listeria monocytogenes NOT DETECTED NOT DETECTED Final    Comment: CRITICAL RESULT CALLED TO, READ BACK BY AND VERIFIED WITH: J [facility] PHARMD 11/07/17 0002 JDW    Staphylococcus species NOT DETECTED NOT DETECTED Final   Staphylococcus aureus NOT DETECTED NOT DETECTED Final   Streptococcus species NOT DETECTED NOT DETECTED Final   Streptococcus agalactiae NOT DETECTED NOT DETECTED Final   Streptococcus pneumoniae NOT DETECTED NOT  DETECTED Final   Streptococcus pyogenes NOT DETECTED NOT DETECTED Final   Acinetobacter baumannii NOT DETECTED NOT DETECTED Final   Enterobacteriaceae species NOT DETECTED NOT DETECTED Final   Enterobacter cloacae complex NOT DETECTED NOT DETECTED Final   Escherichia coli NOT DETECTED NOT DETECTED Final   Klebsiella oxytoca NOT DETECTED NOT DETECTED Final   Klebsiella pneumoniae NOT DETECTED  NOT DETECTED Final   Proteus species NOT DETECTED NOT DETECTED Final   Serratia marcescens NOT DETECTED NOT DETECTED Final   Haemophilus influenzae NOT DETECTED NOT DETECTED Final   Neisseria meningitidis NOT DETECTED NOT DETECTED Final   Pseudomonas aeruginosa NOT DETECTED NOT DETECTED Final   Candida albicans NOT DETECTED NOT DETECTED Final   Candida glabrata NOT DETECTED NOT DETECTED Final   Candida krusei NOT DETECTED NOT DETECTED Final   Candida parapsilosis NOT DETECTED NOT DETECTED Final   Candida tropicalis NOT DETECTED NOT DETECTED Final    Comment: Performed at [facility], [place] [address] [place], [place] [address]  Aerobic/Anaerobic Culture (surgical/deep wound)     Status: Abnormal   Collection Time: 11/04/17 12:40 PM  Result Value Ref Range Status   Specimen Description ABSCESS  Final   Special Requests NONE  Final   Gram Stain   Final    ABUNDANT WBC PRESENT, PREDOMINANTLY PMN MODERATE GRAM NEGATIVE RODS MODERATE GRAM POSITIVE COCCI Performed at [facility], [address], [place], [place] [address]    Culture (A)  Final    MULTIPLE ORGANISMS PRESENT, NONE PREDOMINANT MIXED ANAEROBIC FLORA PRESENT.  CALL LAB IF FURTHER IID REQUIRED.    Report Status 11/06/2017 FINAL  Final  MRSA PCR Screening     Status: Abnormal   Collection Time: 11/04/17 10:26 PM  Result Value Ref Range Status   MRSA by PCR POSITIVE (A) NEGATIVE Final    Comment:        The GeneXpert MRSA Assay (FDA approved for NASAL specimens only), is one component of a comprehensive MRSA  colonization surveillance program. It is not intended to diagnose MRSA infection nor to guide or monitor treatment for MRSA infections. CRITICAL RESULT CALLED TO, READ BACK BY AND VERIFIED WITH: Mellissa Kohut RN 11/05/17 0300 JDW Performed at [facility], [address], [place], [place] [address]     RADIOLOGY STUDIES/RESULTS: Ct Abdomen Pelvis Wo Contrast  Result Date: 10/31/2017 CLINICAL DATA:  Abdominal pain and swelling for 4 weeks. EXAM: CT ABDOMEN AND PELVIS WITHOUT CONTRAST TECHNIQUE: Multidetector CT imaging of the abdomen and pelvis was performed following the standard protocol without IV contrast. COMPARISON:  None. FINDINGS: Lower chest: The lung bases are clear. No pleural effusion or worrisome pulmonary lesions. The heart is normal in size. Coronary artery calcifications are noted. The distal esophagus is grossly normal. Hepatobiliary: No focal hepatic lesions or intrahepatic biliary dilatation. The gallbladder is surgically absent. Mild associated common bile duct dilatation. Pancreas: No mass, inflammation or ductal dilatation. Spleen: Normal size.  No focal lesions. Adrenals/Urinary Tract: The adrenal glands and kidneys are unremarkable. Small bilateral renal calculi but no obstructing ureteral calculi or bladder calculi. Stomach/Bowel: The stomach, duodenum and small bowel are unremarkable. No acute inflammatory changes, mass lesions or obstructive findings. There is an inflammatory process in the right pelvis with a 5.4 x 4.7 cm abscess. This is located between the cecal tip/terminal ileum and the sigmoid colon. Although this could be ruptured appendicitis I think it is more likely a diverticular abscess extending off of the right wall of the sigmoid colon. There is underlying sigmoid diverticulosis and on the coronal images there appears to be some air connecting the abscess with the colon. Significant surrounding inflammatory phlegmon involving the dome region of the bladder.  Vascular/Lymphatic: Moderate atherosclerotic calcifications involving the aorta and iliac arteries. Scattered borderline mesenteric and retroperitoneal lymph nodes likely inflammatory/hyperplastic. No pelvic adenopathy. Borderline enlarged inguinal lymph  nodes. Reproductive: The prostate gland and seminal vesicles are grossly normal. A penile prosthesis is noted. Other: No abdominal wall hernia or subcutaneous lesions. No ascites. Musculoskeletal: No significant bony findings. Degenerative changes involving the spine and spinal fusion hardware noted. IMPRESSION: 1. 5 cm right-sided pelvic abscess most likely related to sigmoid diverticulitis. It is possible this is ruptured appendicitis if the patient still has his appendix. I think that is less likely. There is significant surrounding inflammatory phlegmon involving the terminal ileum and also the dome of the bladder. 2. Small bilateral renal calculi but no obstructing ureteral calculi or bladder calculi. 3. Status post cholecystectomy.  No biliary dilatation. These results will be called to the ordering clinician or representative by the Radiologist Assistant, and communication documented in the PACS or zVision Dashboard. Electronically Signed   By: Marijo Sanes M.D.   On: 10/31/2017 16:41   Ct Image Guided Drainage By Percutaneous Catheter  Result Date: 11/04/2017 INDICATION: 62 year old male with a history of lower abdominal abscess secondary to diverticulitis EXAM: CT-GUIDED DRAINAGE ABDOMINAL ABSCESS MEDICATIONS: The patient is currently admitted to the hospital and receiving intravenous antibiotics. The antibiotics were administered within an appropriate time frame prior to the initiation of the procedure. ANESTHESIA/SEDATION: 1.5 mg IV Versed 75 mcg IV Fentanyl Moderate Sedation Time:  13 minutes The patient was continuously monitored during the procedure by the interventional radiology nurse under my direct supervision. COMPLICATIONS: None TECHNIQUE:  Informed written consent was obtained from the patient after a thorough discussion of the procedural risks, benefits and alternatives. All questions were addressed. Maximal Sterile Barrier Technique was utilized including caps, mask, sterile gowns, sterile gloves, sterile drape, hand hygiene and skin antiseptic. A timeout was performed prior to the initiation of the procedure. PROCEDURE: The operative field was prepped with Chlorhexidine in a sterile fashion, and a sterile drape was applied covering the operative field. A sterile gown and sterile gloves were used for the procedure. Local anesthesia was provided with 1% Lidocaine. Once the patient is prepped and draped in the usual sterile fashion, 1% lidocaine was used for local anesthesia. Using modified Seldinger technique, a 10 French drain was placed in the abscess cavity within the lower abdomen. Proximally 25 cc of purulent in slightly bloody fluid aspirated. Sample was sent for culture. Catheter was sutured in position and attached to bulb suction. Patient tolerated the procedure well and remained hemodynamically stable throughout. No complications were encountered and no significant blood loss. FINDINGS: Scout CT demonstrates abscess within the lower abdomen, just superior to the urinary bladder and associated with sigmoid colon and the sigmoid colon inflammatory changes. Status post drainage there is a 10 French catheter within the abscess cavity which is collapsed. IMPRESSION: Status post CT-guided drainage of abdominal abscess. Signed, Dulcy Fanny. Earleen Newport, DO [facility] Electronically Signed   By: Corrie Mckusick D.O.   On: 11/04/2017 15:09     LOS: 4 days   Oren Binet, MD  [facility] Pager:[phone]  If 7PM-7AM, please contact night-coverage [email or web address] Password [facility] 11/08/2017, 10:23 AM

## 2017-11-09 LAB — GLUCOSE, CAPILLARY
Glucose-Capillary: 123 mg/dL — ABNORMAL HIGH (ref 65–99)
Glucose-Capillary: 125 mg/dL — ABNORMAL HIGH (ref 65–99)
Glucose-Capillary: 143 mg/dL — ABNORMAL HIGH (ref 65–99)
Glucose-Capillary: 207 mg/dL — ABNORMAL HIGH (ref 65–99)
Glucose-Capillary: 215 mg/dL — ABNORMAL HIGH (ref 65–99)
Glucose-Capillary: 220 mg/dL — ABNORMAL HIGH (ref 65–99)
Glucose-Capillary: 326 mg/dL — ABNORMAL HIGH (ref 65–99)

## 2017-11-09 LAB — CULTURE, BLOOD (ROUTINE X 2)
Culture: NO GROWTH
Special Requests: ADEQUATE

## 2017-11-09 LAB — BASIC METABOLIC PANEL
Anion gap: 11 (ref 5–15)
BUN: 10 mg/dL (ref 6–20)
CO2: 25 mmol/L (ref 22–32)
Calcium: 8.7 mg/dL — ABNORMAL LOW (ref 8.9–10.3)
Chloride: 103 mmol/L (ref 101–111)
Creatinine, Ser: 1.44 mg/dL — ABNORMAL HIGH (ref 0.61–1.24)
GFR calc Af Amer: 58 mL/min — ABNORMAL LOW (ref >60)
GFR calc non Af Amer: 50 mL/min — ABNORMAL LOW (ref >60)
Glucose, Bld: 142 mg/dL — ABNORMAL HIGH (ref 65–99)
Potassium: 3.8 mmol/L (ref 3.5–5.1)
Sodium: 139 mmol/L (ref 135–145)

## 2017-11-09 MED ORDER — HYDRALAZINE HCL 25 MG PO TABS
25.0000 mg | ORAL_TABLET | Freq: Three times a day (TID) | ORAL | Status: DC
  Administered 2017-11-09 – 2017-11-10 (×4): 25 mg via ORAL
  Filled 2017-11-09 (×4): qty 1

## 2017-11-09 MED ORDER — TORSEMIDE 20 MG PO TABS
20.0000 mg | ORAL_TABLET | Freq: Every morning | ORAL | Status: DC
  Administered 2017-11-09 – 2017-11-10 (×2): 20 mg via ORAL
  Filled 2017-11-09 (×2): qty 1

## 2017-11-09 NOTE — Progress Notes (Signed)
   Subjective/Chief Complaint: Comfortable Tolerating PO   Objective: Vital signs in last 24 hours: Temp:  [98.3 F (36.8 C)-98.9 F (37.2 C)] 98.9 F (37.2 C) (03/02 0500) Pulse Rate:  [63-72] 70 (03/02 0500) Resp:  [18] 18 (03/02 0500) BP: (170-198)/(65-82) 196/72 (03/02 0500) SpO2:  [98 %] 98 % (03/02 0500) Weight:  [118.8 kg (261 lb 12.8 oz)] 118.8 kg (261 lb 12.8 oz) (03/02 0458) Last BM Date: 11/08/17  Intake/Output from previous day: 03/01 0701 - 03/02 0700 In: 2061.7 [P.O.:700; I.V.:1236.7; IV Piggyback:100] Out: 4030 [Urine:4000; Drains:30] Intake/Output this shift: No intake/output data recorded.  Exam: Looks comfortable Abdomen soft, drain serosang  Lab Results:  Recent Labs    11/07/17 0706 11/08/17 0626  WBC 6.7 6.5  HGB 8.3* 8.6*  HCT 24.5* 25.6*  PLT 171 189   BMET Recent Labs    11/08/17 0626 11/09/17 0321  NA 136 139  K 3.5 3.8  CL 102 103  CO2 24 25  GLUCOSE 242* 142*  BUN 9 10  CREATININE 1.68* 1.44*  CALCIUM 8.0* 8.7*   PT/INR No results for input(s): LABPROT, INR in the last 72 hours. ABG No results for input(s): PHART, HCO3 in the last 72 hours.  Invalid input(s): PCO2, PO2  Studies/Results: No results found.  Anti-infectives: Anti-infectives (From admission, onward)   Start     Dose/Rate Route Frequency Ordered Stop   11/08/17 1400  Ampicillin-Sulbactam (UNASYN) 3 g in sodium chloride 0.9 % 100 mL IVPB     3 g 200 mL/hr over 30 Minutes Intravenous Every 6 hours 11/08/17 0940     11/04/17 1500  piperacillin-tazobactam (ZOSYN) IVPB 3.375 g  Status:  Discontinued     3.375 g 12.5 mL/hr over 240 Minutes Intravenous Every 8 hours 11/04/17 0834 11/08/17 0940   11/04/17 0730  piperacillin-tazobactam (ZOSYN) IVPB 3.375 g     3.375 g 100 mL/hr over 30 Minutes Intravenous  Once 11/04/17 6195 11/04/17 1035      Assessment/Plan:  Sigmoid diverticulitis with abscess s/p Perc drain.  Blood culture with positive results for  gram neg rods,  ID still pending.  Agree that he needs to stay to figure out if additional antibiotics will be needed.  LOS: 5 days    Adaleigh Warf A 11/09/2017

## 2017-11-09 NOTE — Progress Notes (Signed)
PROGRESS NOTE        PATIENT DETAILS Name: [name] Age: 62 y.o. Sex: male Date of Birth: 1956/09/07 Admit Date: 11/04/2017 Admitting Physician Karmen Bongo, MD GQQ:[id number], Thayer Jew, MD  Brief Narrative: Patient is a 62 y.o. male with history of CAD status post PCI 0932, chronic diastolic heart failure, DM-2 who presented with abdominal pain, found to have 5 cm right-sided pelvic abscess, general surgery and interventional radiology were consulted, he underwent percutaneous drainage-and remains on empiric antimicrobial therapy.  Slowly improving with above-noted care.  See below for further details.  Subjective: No diarrhea, abdominal pain is stable..  Assessment/Plan: Pelvic abscess with gram-negative bacteremia: Likely secondary to diverticulitis with microperforation-underwent drain placement on 2/25.  Abscess culture positive for mixed flora, blood cultures-preliminary-showing gram-negative bacteria-awaiting final ID.  Remains on empiric Unasyn.  Diet has been advanced which he seems to be tolerating.  IR and general surgery following as well.  Acute kidney injury on chronic kidney disease stage III: Acute kidney injury likely hemodynamically mediated, improving with IV antimicrobial therapy, resolution of diarrhea.  Creatinine now very close to usual baseline, continue to avoid nephrotoxic agents, continue to follow electrolytes closely.      Anemia: Likely secondary to acute illness-appears to have normocytic anemia at baseline-which is probably related to chronic kidney disease.  No indication of blood loss-and no indication to transfuse-he is asymptomatic.  Diarrhea: Likely related to antimicrobial therapy-no further diarrhea since he has been switched over to Unasyn.    Doubt C. difficile colitis at this time-hence stool workup not sent.   CAD status post PCI in 2016: Currently no anginal symptoms-continue antiplatelet agents  Chronic diastolic heart  failure: Status remains stable-diuretics were briefly held due to acute kidney injury-we will resume today.    Hypokalemia: Repleted  DM-2: CBGs much better after initiation of Lantus 40 units nightly.  Follow for now.    Hypertension: Continues to be uncontrolled-continue current dosing of Coreg, Imdur and amlodipine.  We will add hydralazine and have been restarted diuretics.  Follow and adjust accordingly.    Chronic pain syndrome: Continue transdermal fentanyl-and Dilaudid for breakthrough pain.    GERD: Continue PPI  IZT:[id number] Flomax  History of right below-knee amputation  DVT Prophylaxis: Prophylactic Lovenox   Code Status: Full code   Family Communication: None at bedside  Disposition Plan: Remain inpatient-home in the next 2 days or so-once cleared by general surgery  Antimicrobial agents: Anti-infectives (From admission, onward)   Start     Dose/Rate Route Frequency Ordered Stop   11/08/17 1400  Ampicillin-Sulbactam (UNASYN) 3 g in sodium chloride 0.9 % 100 mL IVPB     3 g 200 mL/hr over 30 Minutes Intravenous Every 6 hours 11/08/17 0940     11/04/17 1500  piperacillin-tazobactam (ZOSYN) IVPB 3.375 g  Status:  Discontinued     3.375 g 12.5 mL/hr over 240 Minutes Intravenous Every 8 hours 11/04/17 0834 11/08/17 0940   11/04/17 0730  piperacillin-tazobactam (ZOSYN) IVPB 3.375 g     3.375 g 100 mL/hr over 30 Minutes Intravenous  Once 11/04/17 3382 11/04/17 1035      Procedures: 2/25>> CT guided percutaneous drain placement  CONSULTS:  general surgery and ir  Time spent: 25 minutes-Greater than 50% of this time was spent in counseling, explanation of diagnosis, planning of further management, and coordination of care.  MEDICATIONS: Scheduled Meds: . amLODipine  10 mg Oral Daily  . aspirin EC  81 mg Oral Daily  . atorvastatin  40 mg Oral q1800  . carvedilol  25 mg Oral BID WC  . clopidogrel  75 mg Oral Daily  . enoxaparin (LOVENOX) injection  40 mg  Subcutaneous Q24H  . fentaNYL  50 mcg Transdermal Q72H  . insulin aspart  0-9 Units Subcutaneous Q4H  . insulin glargine  40 Units Subcutaneous QHS  . isosorbide mononitrate  120 mg Oral Daily  . pantoprazole  40 mg Oral QAC breakfast  . pregabalin  100 mg Oral TID  . sodium chloride flush  5 mL Intracatheter Q8H  . tamsulosin  0.4 mg Oral Daily   Continuous Infusions: . sodium chloride 50 mL/hr at 11/08/17 2023  . ampicillin-sulbactam (UNASYN) IV 3 g (11/09/17 0822)   PRN Meds:.acetaminophen **OR** acetaminophen, hydrALAZINE, HYDROmorphone (DILAUDID) injection, linaclotide, ondansetron **OR** ondansetron (ZOFRAN) IV, oxyCODONE-acetaminophen   PHYSICAL EXAM: Vital signs: Vitals:   11/08/17 2100 11/09/17 0458 11/09/17 0500 11/09/17 0823  BP: (!) 173/65  (!) 196/72 (!) 176/77  Pulse: 71  70 75  Resp: 18  18 18   Temp: 98.6 F (37 C)  98.9 F (37.2 C) 98.2 F (36.8 C)  TempSrc: Oral  Oral Oral  SpO2: 98%  98%   Weight:  118.8 kg (261 lb 12.8 oz)    Height:       Filed Weights   11/07/17 0437 11/08/17 0423 11/09/17 0458  Weight: 119.8 kg (264 lb 1.6 oz) 119.6 kg (263 lb 9.6 oz) 118.8 kg (261 lb 12.8 oz)   Body mass index is 33.61 kg/m.   General appearance :Awake, alert, not in any distress.  Eyes:, pupils equally reactive to light and accomodation,no scleral icterus. HEENT: Atraumatic and Normocephalic Neck: supple, no JVD. Resp:Good air entry bilaterally, no added sounds CVS: S1 S2 regular, no murmurs.  GI: Bowel sounds present, mildly tender mostly in the lower abdomen but no peritoneal signs.  Extremities: Right BKA  Neurology:  speech clear,Non focal, sensation is grossly intact. Psychiatric: Normal judgment and insight. Normal mood. Musculoskeletal:No digital cyanosis Skin:No Rash, warm and dry Wounds:N/A  I have personally reviewed following labs and imaging studies  LABORATORY DATA: CBC: Recent Labs  Lab 11/04/17 0541 11/05/17 0707 11/07/17 0706  11/08/17 0626  WBC 6.6 6.9 6.7 6.5  NEUTROABS 4.6  --   --   --   HGB 10.6* 8.9* 8.3* 8.6*  HCT 31.3* 26.1* 24.5* 25.6*  MCV 80.7 82.1 82.8 82.6  PLT 265 202 171 989    Basic Metabolic Panel: Recent Labs  Lab 11/04/17 0541 11/05/17 0707 11/07/17 0706 11/08/17 0626 11/09/17 0321  NA 139 137 139 136 139  K 3.3* 3.5 3.6 3.5 3.8  CL 106 104 105 102 103  CO2 24 24 25 24 25   GLUCOSE 105* 102* 221* 242* 142*  BUN 7 7 10 9 10   CREATININE 1.20 1.50* 1.73* 1.68* 1.44*  CALCIUM 8.5* 7.8* 8.0* 8.0* 8.7*    GFR: Estimated Creatinine Clearance: 71.9 mL/min (A) (by C-G formula based on SCr of 1.44 mg/dL (H)).  Liver Function Tests: Recent Labs  Lab 11/04/17 0541 11/05/17 0707  AST 26 13*  ALT 13* 11*  ALKPHOS 106 86  BILITOT 0.5 0.5  PROT 6.1* 4.9*  ALBUMIN 3.0* 2.4*   No results for input(s): LIPASE, AMYLASE in the last 168 hours. No results for input(s): AMMONIA in the last 168 hours.  Coagulation Profile:  Recent Labs  Lab 11/04/17 0910  INR 1.05    Cardiac Enzymes: No results for input(s): CKTOTAL, CKMB, CKMBINDEX, TROPONINI in the last 168 hours.  BNP (last 3 results) No results for input(s): PROBNP in the last 8760 hours.  HbA1C: No results for input(s): HGBA1C in the last 72 hours.  CBG: Recent Labs  Lab 11/08/17 1708 11/08/17 2028 11/09/17 0014 11/09/17 0434 11/09/17 0750  GLUCAP 309* 326* 220* 123* 125*    Lipid Profile: No results for input(s): CHOL, HDL, LDLCALC, TRIG, CHOLHDL, LDLDIRECT in the last 72 hours.  Thyroid Function Tests: No results for input(s): TSH, T4TOTAL, FREET4, T3FREE, THYROIDAB in the last 72 hours.  Anemia Panel: No results for input(s): VITAMINB12, FOLATE, FERRITIN, TIBC, IRON, RETICCTPCT in the last 72 hours.  Urine analysis:    Component Value Date/Time   COLORURINE COLORLESS (A) 11/04/2017 0540   APPEARANCEUR CLEAR 11/04/2017 0540   LABSPEC 1.003 (L) 11/04/2017 0540   PHURINE 7.0 11/04/2017 0540   GLUCOSEU 50  (A) 11/04/2017 0540   HGBUR NEGATIVE 11/04/2017 0540   BILIRUBINUR NEGATIVE 11/04/2017 0540   KETONESUR NEGATIVE 11/04/2017 0540   PROTEINUR 100 (A) 11/04/2017 0540   UROBILINOGEN 0.2 01/14/2015 0346   NITRITE NEGATIVE 11/04/2017 0540   LEUKOCYTESUR NEGATIVE 11/04/2017 0540    Sepsis Labs: Lactic Acid, Venous    Component Value Date/Time   LATICACIDVEN 1.53 11/04/2017 0758    MICROBIOLOGY: Recent Results (from the past 240 hour(s))  Blood culture (routine x 2)     Status: None (Preliminary result)   Collection Time: 11/04/17  7:43 AM  Result Value Ref Range Status   Specimen Description BLOOD RIGHT ANTECUBITAL  Final   Special Requests   Final    BOTTLES DRAWN AEROBIC AND ANAEROBIC Blood Culture adequate volume   Culture   Final    NO GROWTH 4 DAYS Performed at [facility], [place] [address], [place], [place] [address]    Report Status PENDING  Incomplete  Blood culture (routine x 2)     Status: None (Preliminary result)   Collection Time: 11/04/17  8:54 AM  Result Value Ref Range Status   Specimen Description BLOOD RIGHT FOREARM  Final   Special Requests   Final    BOTTLES DRAWN AEROBIC AND ANAEROBIC Blood Culture adequate volume   Culture  Setup Time   Final    GRAM NEGATIVE RODS AEROBIC BOTTLE ONLY CRITICAL RESULT CALLED TO, READ BACK BY AND VERIFIED WITH: J [facility] 11/07/17 1202 JDW    Culture   Final    GRAM NEGATIVE RODS IDENTIFICATION AND SUSCEPTIBILITIES TO FOLLOW Performed at [facility], [place] [address], [place], [place] [address]    Report Status PENDING  Incomplete  Blood Culture ID Panel (Reflexed)     Status: None   Collection Time: 11/04/17  8:54 AM  Result Value Ref Range Status   Enterococcus species NOT DETECTED NOT DETECTED Final   Listeria monocytogenes NOT DETECTED NOT DETECTED Final    Comment: CRITICAL RESULT CALLED TO, READ BACK BY AND VERIFIED WITH: J [facility] PHARMD 11/07/17 0002 JDW    Staphylococcus species NOT DETECTED  NOT DETECTED Final   Staphylococcus aureus NOT DETECTED NOT DETECTED Final   Streptococcus species NOT DETECTED NOT DETECTED Final   Streptococcus agalactiae NOT DETECTED NOT DETECTED Final   Streptococcus pneumoniae NOT DETECTED NOT DETECTED Final   Streptococcus pyogenes NOT DETECTED NOT DETECTED Final   Acinetobacter baumannii NOT DETECTED NOT DETECTED Final  Enterobacteriaceae species NOT DETECTED NOT DETECTED Final   Enterobacter cloacae complex NOT DETECTED NOT DETECTED Final   Escherichia coli NOT DETECTED NOT DETECTED Final   Klebsiella oxytoca NOT DETECTED NOT DETECTED Final   Klebsiella pneumoniae NOT DETECTED NOT DETECTED Final   Proteus species NOT DETECTED NOT DETECTED Final   Serratia marcescens NOT DETECTED NOT DETECTED Final   Haemophilus influenzae NOT DETECTED NOT DETECTED Final   Neisseria meningitidis NOT DETECTED NOT DETECTED Final   Pseudomonas aeruginosa NOT DETECTED NOT DETECTED Final   Candida albicans NOT DETECTED NOT DETECTED Final   Candida glabrata NOT DETECTED NOT DETECTED Final   Candida krusei NOT DETECTED NOT DETECTED Final   Candida parapsilosis NOT DETECTED NOT DETECTED Final   Candida tropicalis NOT DETECTED NOT DETECTED Final    Comment: Performed at [facility], [place] [address], [place], [place] [address]  Aerobic/Anaerobic Culture (surgical/deep wound)     Status: Abnormal   Collection Time: 11/04/17 12:40 PM  Result Value Ref Range Status   Specimen Description ABSCESS  Final   Special Requests NONE  Final   Gram Stain   Final    ABUNDANT WBC PRESENT, PREDOMINANTLY PMN MODERATE GRAM NEGATIVE RODS MODERATE GRAM POSITIVE COCCI Performed at [facility], [address], [place], [place] [address]    Culture (A)  Final    MULTIPLE ORGANISMS PRESENT, NONE PREDOMINANT MIXED ANAEROBIC FLORA PRESENT.  CALL LAB IF FURTHER IID REQUIRED.    Report Status 11/06/2017 FINAL  Final  MRSA PCR Screening     Status: Abnormal   Collection  Time: 11/04/17 10:26 PM  Result Value Ref Range Status   MRSA by PCR POSITIVE (A) NEGATIVE Final    Comment:        The GeneXpert MRSA Assay (FDA approved for NASAL specimens only), is one component of a comprehensive MRSA colonization surveillance program. It is not intended to diagnose MRSA infection nor to guide or monitor treatment for MRSA infections. CRITICAL RESULT CALLED TO, READ BACK BY AND VERIFIED WITH: Mellissa Kohut RN 11/05/17 0300 JDW Performed at [facility], [address], [place], [place] [address]     RADIOLOGY STUDIES/RESULTS: Ct Abdomen Pelvis Wo Contrast  Result Date: 10/31/2017 CLINICAL DATA:  Abdominal pain and swelling for 4 weeks. EXAM: CT ABDOMEN AND PELVIS WITHOUT CONTRAST TECHNIQUE: Multidetector CT imaging of the abdomen and pelvis was performed following the standard protocol without IV contrast. COMPARISON:  None. FINDINGS: Lower chest: The lung bases are clear. No pleural effusion or worrisome pulmonary lesions. The heart is normal in size. Coronary artery calcifications are noted. The distal esophagus is grossly normal. Hepatobiliary: No focal hepatic lesions or intrahepatic biliary dilatation. The gallbladder is surgically absent. Mild associated common bile duct dilatation. Pancreas: No mass, inflammation or ductal dilatation. Spleen: Normal size.  No focal lesions. Adrenals/Urinary Tract: The adrenal glands and kidneys are unremarkable. Small bilateral renal calculi but no obstructing ureteral calculi or bladder calculi. Stomach/Bowel: The stomach, duodenum and small bowel are unremarkable. No acute inflammatory changes, mass lesions or obstructive findings. There is an inflammatory process in the right pelvis with a 5.4 x 4.7 cm abscess. This is located between the cecal tip/terminal ileum and the sigmoid colon. Although this could be ruptured appendicitis I think it is more likely a diverticular abscess extending off of the right wall of the sigmoid  colon. There is underlying sigmoid diverticulosis and on the coronal images there appears to be some air connecting the  abscess with the colon. Significant surrounding inflammatory phlegmon involving the dome region of the bladder. Vascular/Lymphatic: Moderate atherosclerotic calcifications involving the aorta and iliac arteries. Scattered borderline mesenteric and retroperitoneal lymph nodes likely inflammatory/hyperplastic. No pelvic adenopathy. Borderline enlarged inguinal lymph nodes. Reproductive: The prostate gland and seminal vesicles are grossly normal. A penile prosthesis is noted. Other: No abdominal wall hernia or subcutaneous lesions. No ascites. Musculoskeletal: No significant bony findings. Degenerative changes involving the spine and spinal fusion hardware noted. IMPRESSION: 1. 5 cm right-sided pelvic abscess most likely related to sigmoid diverticulitis. It is possible this is ruptured appendicitis if the patient still has his appendix. I think that is less likely. There is significant surrounding inflammatory phlegmon involving the terminal ileum and also the dome of the bladder. 2. Small bilateral renal calculi but no obstructing ureteral calculi or bladder calculi. 3. Status post cholecystectomy.  No biliary dilatation. These results will be called to the ordering clinician or representative by the Radiologist Assistant, and communication documented in the PACS or zVision Dashboard. Electronically Signed   By: Marijo Sanes M.D.   On: 10/31/2017 16:41   Ct Image Guided Drainage By Percutaneous Catheter  Result Date: 11/04/2017 INDICATION: 62 year old male with a history of lower abdominal abscess secondary to diverticulitis EXAM: CT-GUIDED DRAINAGE ABDOMINAL ABSCESS MEDICATIONS: The patient is currently admitted to the hospital and receiving intravenous antibiotics. The antibiotics were administered within an appropriate time frame prior to the initiation of the procedure. ANESTHESIA/SEDATION:  1.5 mg IV Versed 75 mcg IV Fentanyl Moderate Sedation Time:  13 minutes The patient was continuously monitored during the procedure by the interventional radiology nurse under my direct supervision. COMPLICATIONS: None TECHNIQUE: Informed written consent was obtained from the patient after a thorough discussion of the procedural risks, benefits and alternatives. All questions were addressed. Maximal Sterile Barrier Technique was utilized including caps, mask, sterile gowns, sterile gloves, sterile drape, hand hygiene and skin antiseptic. A timeout was performed prior to the initiation of the procedure. PROCEDURE: The operative field was prepped with Chlorhexidine in a sterile fashion, and a sterile drape was applied covering the operative field. A sterile gown and sterile gloves were used for the procedure. Local anesthesia was provided with 1% Lidocaine. Once the patient is prepped and draped in the usual sterile fashion, 1% lidocaine was used for local anesthesia. Using modified Seldinger technique, a 10 French drain was placed in the abscess cavity within the lower abdomen. Proximally 25 cc of purulent in slightly bloody fluid aspirated. Sample was sent for culture. Catheter was sutured in position and attached to bulb suction. Patient tolerated the procedure well and remained hemodynamically stable throughout. No complications were encountered and no significant blood loss. FINDINGS: Scout CT demonstrates abscess within the lower abdomen, just superior to the urinary bladder and associated with sigmoid colon and the sigmoid colon inflammatory changes. Status post drainage there is a 10 French catheter within the abscess cavity which is collapsed. IMPRESSION: Status post CT-guided drainage of abdominal abscess. Signed, Dulcy Fanny. Earleen Newport, DO [facility] Radiology Electronically Signed   By: Corrie Mckusick D.O.   On: 11/04/2017 15:09     LOS: 5 days   Oren Binet, MD  [facility] Pager:[phone]  If 7PM-7AM, please contact night-coverage [email or web address] Password [email or web address] 11/09/2017, 11:04 AM

## 2017-11-09 NOTE — Progress Notes (Signed)
Referring Physician(s): Ghirmire,S/Connor,C  Supervising Physician: Sandi Mariscal  Patient Status:  [name]  Chief Complaint:   lower abdominal abscess  Subjective: Pt doing ok; has had some issues with loose JP bulb connector- will place new one; some mild soreness at drain site suprapubic region   Allergies: Ivp dye [iodinated diagnostic agents] and Adhesive [tape]  Medications: Prior to Admission medications   Medication Sig Start Date End Date Taking? Authorizing Provider  amLODipine-olmesartan (AZOR) 5-20 MG tablet Take 2 tablets by mouth every evening. 08/01/16  Yes Adrian Prows, MD  aspirin EC 81 MG tablet Take 81 mg by mouth daily.   Yes [provider]  atorvastatin (LIPITOR) 40 MG tablet Take 40 mg by mouth daily.   Yes [provider]  carvedilol (COREG) 25 MG tablet Take 1.5 tablets (37.5 mg total) by mouth 2 (two) times daily. 08/01/16  Yes Adrian Prows, MD  clobetasol (TEMOVATE) 0.05 % external solution Apply 1 application topically daily as needed (dry scalp).   Yes [provider]  clopidogrel (PLAVIX) 75 MG tablet Take 75 mg by mouth daily.  03/28/12  Yes [provider]  clotrimazole-betamethasone (LOTRISONE) cream Apply 1 application topically daily as needed (dry skin - beard).    Yes [provider]  collagenase (SANTYL) ointment Apply topically daily. Patient taking differently: Apply 1 application topically daily. TO AFFECTED SITE(S) 09/06/15  Yes Lavina Hamman, MD  fentaNYL (DURAGESIC - DOSED MCG/HR) 50 MCG/HR Place 1 patch (50 mcg total) onto the skin every 3 (three) days. 05/10/16  Yes Tat, Shanon Brow, MD  fluticasone (CUTIVATE) 0.05 % cream Apply 1 application topically 2 (two) times daily as needed (dry skin on face).    Yes [provider]  glyBURIDE-metformin (GLUCOVANCE) 2.5-500 MG per tablet Take 2 tablets by mouth 2 (two) times daily with a meal.  03/05/12  Yes [provider]  HYDROmorphone  (DILAUDID) 2 MG tablet Take 1 tablet (2 mg total) by mouth at bedtime. Patient taking differently: Take 2 mg by mouth at bedtime as needed (for breakthrough pain).  05/10/16  Yes Tat, Shanon Brow, MD  insulin glargine (LANTUS) 100 UNIT/ML injection Inject 0.6 mLs (60 Units total) into the skin at bedtime. Patient taking differently: Inject 50 Units into the skin at bedtime.  05/11/16  Yes Tat, Shanon Brow, MD  Insulin Glulisine (APIDRA) 100 UNIT/ML Solostar Pen Inject 13 Units into the skin 3 (three) times daily with meals. Patient taking differently: Inject 15-18 Units into the skin 3 (three) times daily as needed (for CBG). Sliding scale 05/11/16  Yes Tat, Shanon Brow, MD  isosorbide mononitrate (IMDUR) 120 MG 24 hr tablet Take 120 mg by mouth daily.   Yes [provider]  Linaclotide (LINZESS) 145 MCG CAPS capsule Take 1 capsule (145 mcg total) by mouth daily as needed. Patient taking differently: Take 145 mcg by mouth daily as needed (constipation).  01/20/15  Yes Florencia Reasons, MD  Multiple Vitamin (MULTIVITAMIN WITH MINERALS) TABS tablet Take 1 tablet by mouth daily.   Yes [provider]  nitroGLYCERIN (NITRO-BID) 2 % ointment Apply 0.75 inches topically daily as needed for chest pain.    Yes [provider]  nitroGLYCERIN (NITRODUR - DOSED IN MG/24 HR) 0.2 mg/hr patch Place 1 patch (0.2 mg total) onto the skin daily. 01/08/17  Yes Suzan Slick, NP  nitroGLYCERIN (NITROSTAT) 0.4 MG SL tablet Place 1 tablet (0.4 mg total) under the tongue every 5 (five) minutes as needed (as needed for  esophageal spasm). Patient taking differently: Place 0.4 mg under the tongue every 5 (five) minutes as needed for chest pain.  08/09/14  Yes Pyrtle, Lajuan Lines, MD  omega-3 acid ethyl esters (LOVAZA) 1 G capsule Take 2 g by mouth 2 (two) times daily.   Yes [provider]  ondansetron (ZOFRAN-ODT) 4 MG disintegrating tablet Take 4 mg by mouth 4 (four) times daily as needed for nausea.   Yes [provider]  oxyCODONE-acetaminophen (PERCOCET/ROXICET) 5-325 MG tablet Take 1 tablet by mouth every 8 (eight) hours as needed. Patient taking differently: Take 1 tablet by mouth 2 (two) times daily as needed for moderate pain.  05/10/16  Yes Tat, Shanon Brow, MD  pantoprazole (PROTONIX) 40 MG tablet Take 1 tablet (40 mg total) by mouth daily before breakfast. 10/30/14  Yes Adrian Prows, MD  pregabalin (LYRICA) 100 MG capsule Take 1 capsule (100 mg total) by mouth 3 (three) times daily. 05/10/16  Yes Tat, Shanon Brow, MD  Probiotic CAPS Take 2 capsules by mouth daily after breakfast.   Yes [provider]  Propylene Glycol (SYSTANE BALANCE OP) Place 2-3 drops into both eyes daily as needed (for dry eyes).   Yes [provider]  rOPINIRole (REQUIP) 2 MG tablet Take 2 mg by mouth at bedtime.    Yes [provider]  Tamsulosin HCl (FLOMAX) 0.4 MG CAPS Take 0.4 mg by mouth daily.   Yes [provider]  torsemide (DEMADEX) 20 MG tablet Take 20 mg by mouth every morning. MAY TAKE AN EXTRA 20 MG ONCE A DAY FOR EXCESSIVE FLUID RETENTION   Yes [provider]     Vital Signs: BP (!) 176/77 (BP Location: Left Arm)   Pulse 75   Temp 98.2 F (36.8 C) (Oral)   Resp 18   Ht 6\' 2"  (1.88 m)   Wt 261 lb 12.8 oz (118.8 kg)   SpO2 98%   BMI 33.61 kg/m   Physical Exam  abd drain intact, insertion site ok, output 10 cc today; cx- mult species  Imaging: No results found.  Labs:  CBC: Recent Labs    11/04/17 0541 11/05/17 0707 11/07/17 0706 11/08/17 0626  WBC 6.6 6.9 6.7 6.5  HGB 10.6* 8.9* 8.3* 8.6*  HCT 31.3* 26.1* 24.5* 25.6*  PLT 265 202 171 189    COAGS: Recent Labs    11/04/17 0910  INR 1.05    BMP: Recent Labs    11/05/17 0707 11/07/17 0706 11/08/17 0626 11/09/17 0321  NA 137 139 136 139  K 3.5 3.6 3.5 3.8  CL 104 105 102 103  CO2 24 25 24 25   GLUCOSE 102* 221* 242* 142*  BUN 7 10 9 10   CALCIUM 7.8* 8.0* 8.0* 8.7*  CREATININE 1.50* 1.73* 1.68*  1.44*  GFRNONAA 48* 40* 42* 50*  GFRAA 55* 47* 48* 58*    LIVER FUNCTION TESTS: Recent Labs    11/04/17 0541 11/05/17 0707  BILITOT 0.5 0.5  AST 26 13*  ALT 13* 11*  ALKPHOS 106 86  PROT 6.1* 4.9*  ALBUMIN 3.0* 2.4*    Assessment and Plan: S/p diverticular abscess drain 2/25; afebrile; creat 1.44, WBC nl; blood cx- gm neg rods; cont drain irrigation once daily as OP along with output recording and dressing changes every 1-2 days; drain clinic f/u order placed   Electronically Signed: D. Rowe Robert, PA-C 11/09/2017, 1:50 PM   I spent a total of 15 minutes at the the patient's bedside AND on the patient's  hospital floor or unit, greater than 50% of which was counseling/coordinating care for lower abdominal abscess drain    Patient ID: [name], male   DOB: 12-18-1955, 63 y.o.   MRN: [id number]

## 2017-11-10 LAB — BASIC METABOLIC PANEL
Anion gap: 10 (ref 5–15)
BUN: 14 mg/dL (ref 6–20)
CO2: 25 mmol/L (ref 22–32)
Calcium: 8.5 mg/dL — ABNORMAL LOW (ref 8.9–10.3)
Chloride: 103 mmol/L (ref 101–111)
Creatinine, Ser: 1.51 mg/dL — ABNORMAL HIGH (ref 0.61–1.24)
GFR calc Af Amer: 55 mL/min — ABNORMAL LOW (ref >60)
GFR calc non Af Amer: 47 mL/min — ABNORMAL LOW (ref >60)
Glucose, Bld: 110 mg/dL — ABNORMAL HIGH (ref 65–99)
Potassium: 3.7 mmol/L (ref 3.5–5.1)
Sodium: 138 mmol/L (ref 135–145)

## 2017-11-10 LAB — GLUCOSE, CAPILLARY
Glucose-Capillary: 113 mg/dL — ABNORMAL HIGH (ref 65–99)
Glucose-Capillary: 160 mg/dL — ABNORMAL HIGH (ref 65–99)
Glucose-Capillary: 172 mg/dL — ABNORMAL HIGH (ref 65–99)
Glucose-Capillary: 98 mg/dL (ref 65–99)

## 2017-11-10 MED ORDER — AMOXICILLIN-POT CLAVULANATE 875-125 MG PO TABS: 1.0000 | ORAL_TABLET | Freq: Two times a day (BID) | ORAL | 0 refills | Status: DC

## 2017-11-10 MED ORDER — AMOXICILLIN-POT CLAVULANATE 875-125 MG PO TABS
1.0000 | ORAL_TABLET | Freq: Two times a day (BID) | ORAL | Status: DC
  Administered 2017-11-10: 1 via ORAL
  Filled 2017-11-10: qty 1

## 2017-11-10 NOTE — Care Management Note (Signed)
Case Management Note  Patient Details  Name: [name] MRN: [id number] Date of Birth: [birth year]  Subjective/Objective:       Pt discharging with JP drain in place.  [facility] RN ordered for pt.              Action/Plan: List presented to patient.  [facility], pt's first choice unable to accept referral.  [facility] also unable to accept referral.  [facility] accepted referral with [facility] 11/12/17.  Patient advised.   Expected Discharge Date:  11/10/17               Expected Discharge Plan:  [place]  In-House Referral:  NA  Discharge planning Services  CM Consult  Post Acute Care Choice:  Home Health Choice offered to:  Patient  DME Arranged:    DME Agency:     HH Arranged:  RN [place] Agency:  [facility] (now [facility])  Status of Service:  Completed, signed off  If discussed at [employer] of Stay Meetings, dates discussed:    Additional Comments:  Claudie Leach, RN 11/10/2017, 3:35 PM

## 2017-11-10 NOTE — Progress Notes (Signed)
   Subjective/Chief Complaint: Comfortable Passing flatus Tolerating po No BM last 2 days   Objective: Vital signs in last 24 hours: Temp:  [97.7 F (36.5 C)-99 F (37.2 C)] 97.7 F (36.5 C) (03/03 0512) Pulse Rate:  [64-129] 75 (03/03 0513) Resp:  [18] 18 (03/03 0512) BP: (116-176)/(69-94) 116/94 (03/03 0512) SpO2:  [97 %-98 %] 97 % (03/03 0512) Weight:  [119 kg (262 lb 5.6 oz)] 119 kg (262 lb 5.6 oz) (03/03 0444) Last BM Date: 11/08/17  Intake/Output from previous day: 03/02 0701 - 03/03 0700 In: 445 [P.O.:240; IV Piggyback:200] Out: 3110 [Urine:3100; Drains:10] Intake/Output this shift: No intake/output data recorded.  Exam: Abdomen soft, drain serosang, minimal tenderness  Lab Results:  Recent Labs    11/08/17 0626  WBC 6.5  HGB 8.6*  HCT 25.6*  PLT 189   BMET Recent Labs    11/08/17 0626 11/09/17 0321  NA 136 139  K 3.5 3.8  CL 102 103  CO2 24 25  GLUCOSE 242* 142*  BUN 9 10  CREATININE 1.68* 1.44*  CALCIUM 8.0* 8.7*   PT/INR No results for input(s): LABPROT, INR in the last 72 hours. ABG No results for input(s): PHART, HCO3 in the last 72 hours.  Invalid input(s): PCO2, PO2  Studies/Results: No results found.  Anti-infectives: Anti-infectives (From admission, onward)   Start     Dose/Rate Route Frequency Ordered Stop   11/08/17 1400  Ampicillin-Sulbactam (UNASYN) 3 g in sodium chloride 0.9 % 100 mL IVPB     3 g 200 mL/hr over 30 Minutes Intravenous Every 6 hours 11/08/17 0940     11/04/17 1500  piperacillin-tazobactam (ZOSYN) IVPB 3.375 g  Status:  Discontinued     3.375 g 12.5 mL/hr over 240 Minutes Intravenous Every 8 hours 11/04/17 0834 11/08/17 0940   11/04/17 0730  piperacillin-tazobactam (ZOSYN) IVPB 3.375 g     3.375 g 100 mL/hr over 30 Minutes Intravenous  Once 11/04/17 0721 11/04/17 1035      Assessment/Plan: s/p * No surgery found *  Diverticulitis with abscess s/p IR drain Continuing antibiotics.  Now on oral Drain  clinic follow up made Will need to follow up with [facility]- Dr. Windle Guard after discharge (2 weeks)  LOS: 6 days    [name] A 11/10/2017

## 2017-11-10 NOTE — Progress Notes (Signed)
Reviewed AVS along with jp drain care with pt. Printed pt information on jp drain care and his abx. Pt iv site was removed and assessed. Pt with no further questions at this time. Will continue to monitor pt. Pt for his ride

## 2017-11-10 NOTE — Discharge Summary (Addendum)
PATIENT DETAILS Name: [name] Age: 62 y.o. Sex: male Date of Birth: 06-Jul-1956 MRN: [id number]. Admitting Physician: Karmen Bongo, MD WUJ:WJXBJ, Thayer Jew, MD  Admit Date: 11/04/2017 Discharge date: 11/10/2017  Recommendations for Outpatient Follow-up:  1. Follow up with PCP in 1-2 weeks 2. Please obtain BMP/CBC in one week 3. Follow repeat blood cultures done on 11/10/17 4. Ensure follow up with Gen Surgery, IR drain clinic  Admitted From:  Home  Disposition: Home with home health [place]:  Yes  Equipment/Devices: 2/25>> CT guided percutaneous pelvic drain placement  Discharge Condition: Stable  CODE STATUS: FULL CODE  Diet recommendation:  Heart Healthy / Carb Modified  Brief Summary: See H&P, Labs, Consult and Test reports for all details in brief,Patient is a 62 y.o. male with history of CAD status post PCI 4782, chronic diastolic heart failure, DM-2 who presented with abdominal pain, found to have 5 cm right-sided pelvic abscess, general surgery and interventional radiology were consulted, he underwent percutaneous drainage.Improved with supportive care and Abx therapy. Blood cultures positive for acinetobacter ursingii. See below for further details.  Brief Hospital Course: Pelvic abscess with acinetobacter ursingii bacteremia: Likely secondary to diverticulitis with microperforation-underwent drain placement on 2/25.  Abscess culture positive for mixed flora, blood cultures-positive for Acinetobacter ursingii.  Previously on Zosyn, but was switched over to Unasyn on 3/1.  Patient has markedly improved-with very minimal pain-afebrile and without any leukocytosis.  Case was discussed with infectious disease (Dr. Johnnye Sima) who recommended that if patient was doing well he could be switched over to Augmentin.  Spoke with patient-he has been on IV antimicrobial therapy at home before-he would prefer oral medication rather than IV medications at this time.   Since he has markedly improved-I suspect this is appropriate, will plan on at least 2 weeks of antimicrobial therapy.  We will repeat blood cultures before discharge-this will need to be followed by his outpatient MDs.  Spoke with IR and general surgery today-okay for discharge from their point of view.  Note, by day of discharge patient has been easily tolerating a regular diet.  Acute kidney injury on chronic kidney disease stage III: Acute kidney injury likely hemodynamically mediated, improved with IV antimicrobial therapy, resolution of diarrhea.  Creatinine now very close to usual baseline, continue to avoid nephrotoxic agents, continue to follow electrolytes closely the outpatient setting  Anemia: Likely secondary to acute illness-appears to have normocytic anemia at baseline-which is probably related to chronic kidney disease.  No indication of blood loss-and no indication to transfuse-he is asymptomatic.  Diarrhea: Likely related to antimicrobial therapy-no further diarrhea since he has been switched over to Unasyn.    Doubt C. difficile colitis at this time-hence stool workup not sent.   CAD status post PCI in 2016: Currently no anginal symptoms-continue antiplatelet agents  Chronic diastolic heart failure: Status remains stable-diuretics were briefly held due to acute kidney injury-has been subsequently resumed  Hypokalemia: Repleted  DM-2: CBGs much better after initiation of Lantus-resume usual home dosing on discharge.    Hypertension:  Better controlled-we will resume usual home medications on discharge.    Chronic pain syndrome: Continue transdermal fentanyl-and Dilaudid for breakthrough pain.    GERD: Continue PPI  NFA:[id number] Flomax  History of right below-knee amputation  Procedures/Studies: 2/25>> CT guided percutaneous drain placement  Discharge Diagnoses:  Principal Problem:   Intra-abdominal abscess ([place]) Active Problems:   PVD (peripheral vascular  disease) ([place])   GERD (gastroesophageal reflux disease)   Diabetes  mellitus, insulin dependent (IDDM), controlled ([facility])   CAD (dz of distal, mid and proximal RCA with implantation of 3 overlapping drug-eluting stent,)   Chronic diastolic heart failure, NYHA class 2 ([place])   History of right below knee amputation [facility])   Diverticulitis   Discharge Instructions:  Activity:  As tolerated with Full fall precautions use walker/cane & assistance as needed  Discharge Instructions    Call MD for:  persistant nausea and vomiting   Complete by:  As directed    Call MD for:  severe uncontrolled pain   Complete by:  As directed    Diet - low sodium heart healthy   Complete by:  As directed    Diet Carb Modified   Complete by:  As directed    Discharge instructions   Complete by:  As directed    Follow with Primary MD  Anda Kraft, MD in 1 week  Follow with IR Drain clinic-they will call with appointment-if you do not hear from them in the next few days, please give them a call.  Follow up with Gen surgery in 1-2 weeks  Please get a complete blood count and chemistry panel checked by your Primary MD at your next visit, and again as instructed by your Primary MD.  Get Medicines reviewed and adjusted: Please take all your medications with you for your next visit with your Primary MD  Laboratory/radiological data: Please request your Primary MD to go over all hospital tests and procedure/radiological results at the follow up, please ask your Primary MD to get all Hospital records sent to his/her office.  In some cases, they will be blood work, cultures and biopsy results pending at the time of your discharge. Please request that your primary care M.D. follows up on these results.  Also Note the following: If you experience worsening of your admission symptoms, develop shortness of breath, life threatening emergency, suicidal or homicidal thoughts you must seek medical attention immediately  by calling 911 or calling your MD immediately  if symptoms less severe.  You must read complete instructions/literature along with all the possible adverse reactions/side effects for all the Medicines you take and that have been prescribed to you. Take any new Medicines after you have completely understood and accpet all the possible adverse reactions/side effects.   Do not drive when taking Pain medications or sleeping medications (Benzodaizepines)  Do not take more than prescribed Pain, Sleep and Anxiety Medications. It is not advisable to combine anxiety,sleep and pain medications without talking with your primary care practitioner  Special Instructions: If you have smoked or chewed Tobacco  in the last 2 yrs please stop smoking, stop any regular Alcohol  and or any Recreational drug use.  Wear Seat belts while driving.  Please note: You were cared for by a hospitalist during your hospital stay. Once you are discharged, your primary care physician will handle any further medical issues. Please note that NO REFILLS for any discharge medications will be authorized once you are discharged, as it is imperative that you return to your primary care physician (or establish a relationship with a primary care physician if you do not have one) for your post hospital discharge needs so that they can reassess your need for medications and monitor your lab values.   Discharge wound care:   Complete by:  As directed    Continue drain irrigation once daily as outpatient along with output recording and dressing changes every 1-2 days   Increase activity  slowly   Complete by:  As directed      Allergies as of 11/10/2017      Reactions   Ivp Dye [iodinated Diagnostic Agents] Anaphylaxis, Other (See Comments)   Breathing problems   Adhesive [tape] Rash   Rash after 2-3 days use      Medication List    TAKE these medications   amLODipine-olmesartan 5-20 MG tablet Commonly known as:  AZOR Take 2  tablets by mouth every evening.   amoxicillin-clavulanate 875-125 MG tablet Commonly known as:  AUGMENTIN Take 1 tablet by mouth every 12 (twelve) hours.   aspirin EC 81 MG tablet Take 81 mg by mouth daily.   atorvastatin 40 MG tablet Commonly known as:  LIPITOR Take 40 mg by mouth daily.   carvedilol 25 MG tablet Commonly known as:  COREG Take 1.5 tablets (37.5 mg total) by mouth 2 (two) times daily.   clobetasol 0.05 % external solution Commonly known as:  TEMOVATE Apply 1 application topically daily as needed (dry scalp).   clopidogrel 75 MG tablet Commonly known as:  PLAVIX Take 75 mg by mouth daily.   clotrimazole-betamethasone cream Commonly known as:  LOTRISONE Apply 1 application topically daily as needed (dry skin - beard).   collagenase ointment Commonly known as:  SANTYL Apply topically daily. What changed:    how much to take  additional instructions   fentaNYL 50 MCG/HR Commonly known as:  DURAGESIC - dosed mcg/hr Place 1 patch (50 mcg total) onto the skin every 3 (three) days.   fluticasone 0.05 % cream Commonly known as:  CUTIVATE Apply 1 application topically 2 (two) times daily as needed (dry skin on face).   glyBURIDE-metformin 2.5-500 MG tablet Commonly known as:  GLUCOVANCE Take 2 tablets by mouth 2 (two) times daily with a meal.   HYDROmorphone 2 MG tablet Commonly known as:  DILAUDID Take 1 tablet (2 mg total) by mouth at bedtime. What changed:    when to take this  reasons to take this   insulin glargine 100 UNIT/ML injection Commonly known as:  LANTUS Inject 0.6 mLs (60 Units total) into the skin at bedtime. What changed:  how much to take   Insulin Glulisine 100 UNIT/ML Solostar Pen Commonly known as:  APIDRA Inject 13 Units into the skin 3 (three) times daily with meals. What changed:    how much to take  when to take this  reasons to take this  additional instructions   isosorbide mononitrate 120 MG 24 hr  tablet Commonly known as:  IMDUR Take 120 mg by mouth daily.   linaclotide 145 MCG Caps capsule Commonly known as:  LINZESS Take 1 capsule (145 mcg total) by mouth daily as needed. What changed:  reasons to take this   multivitamin with minerals Tabs tablet Take 1 tablet by mouth daily.   NITRO-BID 2 % ointment Generic drug:  nitroGLYCERIN Apply 0.75 inches topically daily as needed for chest pain. What changed:  Another medication with the same name was changed. Make sure you understand how and when to take each.   nitroGLYCERIN 0.4 MG SL tablet Commonly known as:  NITROSTAT Place 1 tablet (0.4 mg total) under the tongue every 5 (five) minutes as needed (as needed for esophageal spasm). What changed:  reasons to take this   nitroGLYCERIN 0.2 mg/hr patch Commonly known as:  NITRODUR - Dosed in mg/24 hr Place 1 patch (0.2 mg total) onto the skin daily. What changed:  Another medication with the  same name was changed. Make sure you understand how and when to take each.   omega-3 acid ethyl esters 1 g capsule Commonly known as:  LOVAZA Take 2 g by mouth 2 (two) times daily.   ondansetron 4 MG disintegrating tablet Commonly known as:  ZOFRAN-ODT Take 4 mg by mouth 4 (four) times daily as needed for nausea.   oxyCODONE-acetaminophen 5-325 MG tablet Commonly known as:  PERCOCET/ROXICET Take 1 tablet by mouth every 8 (eight) hours as needed. What changed:    when to take this  reasons to take this   pantoprazole 40 MG tablet Commonly known as:  PROTONIX Take 1 tablet (40 mg total) by mouth daily before breakfast.   pregabalin 100 MG capsule Commonly known as:  LYRICA Take 1 capsule (100 mg total) by mouth 3 (three) times daily.   Probiotic Caps Take 2 capsules by mouth daily after breakfast.   rOPINIRole 2 MG tablet Commonly known as:  REQUIP Take 2 mg by mouth at bedtime.   SYSTANE BALANCE OP Place 2-3 drops into both eyes daily as needed (for dry eyes).    tamsulosin 0.4 MG Caps capsule Commonly known as:  FLOMAX Take 0.4 mg by mouth daily.   torsemide 20 MG tablet Commonly known as:  DEMADEX Take 20 mg by mouth every morning. MAY TAKE AN EXTRA 20 MG ONCE A DAY FOR EXCESSIVE FLUID RETENTION            Discharge Care Instructions  (From admission, onward)        Start     Ordered   11/10/17 0000  Discharge wound care:    Comments:  Continue drain irrigation once daily as outpatient along with output recording and dressing changes every 1-2 days   11/10/17 1200     Follow-up Information    Corrie Mckusick, DO Follow up in 10 day(s).   Specialties:  Interventional Radiology, Radiology Why:  pt will hear from scheduler for follow up time and date; call [phone] if questions or concerns Contact information: [address] [place] [address] [phone]        Anda Kraft, MD. Schedule an appointment as soon as possible for a visit in 1 week(s).   Specialty:  Endocrinology Contact information: [address] [place] [address] [phone]        Clovis Riley, MD. Schedule an appointment as soon as possible for a visit in 1 week(s).   Specialty:  General Surgery Contact information: [phone]          Allergies  Allergen Reactions  . Ivp Dye [Iodinated Diagnostic Agents] Anaphylaxis and Other (See Comments)    Breathing problems   . Adhesive [Tape] Rash    Rash after 2-3 days use   Consultations:   general surgery and IR  Other Procedures/Studies: Ct Abdomen Pelvis Wo Contrast  Result Date: 10/31/2017 CLINICAL DATA:  Abdominal pain and swelling for 4 weeks. EXAM: CT ABDOMEN AND PELVIS WITHOUT CONTRAST TECHNIQUE: Multidetector CT imaging of the abdomen and pelvis was performed following the standard protocol without IV contrast. COMPARISON:  None. FINDINGS: Lower chest: The lung bases are clear. No pleural effusion or worrisome pulmonary lesions. The heart is  normal in size. Coronary artery calcifications are noted. The distal esophagus is grossly normal. Hepatobiliary: No focal hepatic lesions or intrahepatic biliary dilatation. The gallbladder is surgically absent. Mild associated common bile duct dilatation. Pancreas: No mass, inflammation or ductal dilatation. Spleen: Normal size.  No  focal lesions. Adrenals/Urinary Tract: The adrenal glands and kidneys are unremarkable. Small bilateral renal calculi but no obstructing ureteral calculi or bladder calculi. Stomach/Bowel: The stomach, duodenum and small bowel are unremarkable. No acute inflammatory changes, mass lesions or obstructive findings. There is an inflammatory process in the right pelvis with a 5.4 x 4.7 cm abscess. This is located between the cecal tip/terminal ileum and the sigmoid colon. Although this could be ruptured appendicitis I think it is more likely a diverticular abscess extending off of the right wall of the sigmoid colon. There is underlying sigmoid diverticulosis and on the coronal images there appears to be some air connecting the abscess with the colon. Significant surrounding inflammatory phlegmon involving the dome region of the bladder. Vascular/Lymphatic: Moderate atherosclerotic calcifications involving the aorta and iliac arteries. Scattered borderline mesenteric and retroperitoneal lymph nodes likely inflammatory/hyperplastic. No pelvic adenopathy. Borderline enlarged inguinal lymph nodes. Reproductive: The prostate gland and seminal vesicles are grossly normal. A penile prosthesis is noted. Other: No abdominal wall hernia or subcutaneous lesions. No ascites. Musculoskeletal: No significant bony findings. Degenerative changes involving the spine and spinal fusion hardware noted. IMPRESSION: 1. 5 cm right-sided pelvic abscess most likely related to sigmoid diverticulitis. It is possible this is ruptured appendicitis if the patient still has his appendix. I think that is less likely.  There is significant surrounding inflammatory phlegmon involving the terminal ileum and also the dome of the bladder. 2. Small bilateral renal calculi but no obstructing ureteral calculi or bladder calculi. 3. Status post cholecystectomy.  No biliary dilatation. These results will be called to the ordering clinician or representative by the Radiologist Assistant, and communication documented in the PACS or zVision Dashboard. Electronically Signed   By: Marijo Sanes M.D.   On: 10/31/2017 16:41   Ct Image Guided Drainage By Percutaneous Catheter  Result Date: 11/04/2017 INDICATION: 62 year old male with a history of lower abdominal abscess secondary to diverticulitis EXAM: CT-GUIDED DRAINAGE ABDOMINAL ABSCESS MEDICATIONS: The patient is currently admitted to the hospital and receiving intravenous antibiotics. The antibiotics were administered within an appropriate time frame prior to the initiation of the procedure. ANESTHESIA/SEDATION: 1.5 mg IV Versed 75 mcg IV Fentanyl Moderate Sedation Time:  13 minutes The patient was continuously monitored during the procedure by the interventional radiology nurse under my direct supervision. COMPLICATIONS: None TECHNIQUE: Informed written consent was obtained from the patient after a thorough discussion of the procedural risks, benefits and alternatives. All questions were addressed. Maximal Sterile Barrier Technique was utilized including caps, mask, sterile gowns, sterile gloves, sterile drape, hand hygiene and skin antiseptic. A timeout was performed prior to the initiation of the procedure. PROCEDURE: The operative field was prepped with Chlorhexidine in a sterile fashion, and a sterile drape was applied covering the operative field. A sterile gown and sterile gloves were used for the procedure. Local anesthesia was provided with 1% Lidocaine. Once the patient is prepped and draped in the usual sterile fashion, 1% lidocaine was used for local anesthesia. Using modified  Seldinger technique, a 10 French drain was placed in the abscess cavity within the lower abdomen. Proximally 25 cc of purulent in slightly bloody fluid aspirated. Sample was sent for culture. Catheter was sutured in position and attached to bulb suction. Patient tolerated the procedure well and remained hemodynamically stable throughout. No complications were encountered and no significant blood loss. FINDINGS: Scout CT demonstrates abscess within the lower abdomen, just superior to the urinary bladder and associated with sigmoid colon and the sigmoid colon inflammatory  changes. Status post drainage there is a 10 French catheter within the abscess cavity which is collapsed. IMPRESSION: Status post CT-guided drainage of abdominal abscess. Signed, Dulcy Fanny. Earleen Newport, DO [facility] Electronically Signed   By: Corrie Mckusick D.O.   On: 11/04/2017 15:09      TODAY-DAY OF DISCHARGE:  Subjective:   [name] today has no headache,no chest abdominal pain,no new weakness tingling or numbness, feels much better wants to go home today.   Objective:   Blood pressure (!) 164/68, pulse 68, temperature 97.7 F (36.5 C), temperature source Oral, resp. rate 18, height 6\' 2"  (1.88 m), weight 119 kg (262 lb 5.6 oz), SpO2 98 %.  Intake/Output Summary (Last 24 hours) at 11/10/2017 1201 Last data filed at 11/10/2017 1147 Gross per 24 hour  Intake 440 ml  Output 3850 ml  Net -3410 ml   Filed Weights   11/08/17 0423 11/09/17 0458 11/10/17 0444  Weight: 119.6 kg (263 lb 9.6 oz) 118.8 kg (261 lb 12.8 oz) 119 kg (262 lb 5.6 oz)    Exam: Awake Alert, Oriented *3, No new F.N deficits, Normal affect [place].AT,PERRAL Supple Neck,No JVD, No cervical lymphadenopathy appriciated.  Symmetrical Chest wall movement, Good air movement bilaterally, CTAB RRR,No Gallops,Rubs or new Murmurs, No Parasternal Heave +ve B.Sounds, Abd Soft, Non tender, No organomegaly  appriciated, No rebound -guarding or rigidity. No Cyanosis, Clubbing or edema, No new Rash or bruise   PERTINENT RADIOLOGIC STUDIES: Ct Abdomen Pelvis Wo Contrast  Result Date: 10/31/2017 CLINICAL DATA:  Abdominal pain and swelling for 4 weeks. EXAM: CT ABDOMEN AND PELVIS WITHOUT CONTRAST TECHNIQUE: Multidetector CT imaging of the abdomen and pelvis was performed following the standard protocol without IV contrast. COMPARISON:  None. FINDINGS: Lower chest: The lung bases are clear. No pleural effusion or worrisome pulmonary lesions. The heart is normal in size. Coronary artery calcifications are noted. The distal esophagus is grossly normal. Hepatobiliary: No focal hepatic lesions or intrahepatic biliary dilatation. The gallbladder is surgically absent. Mild associated common bile duct dilatation. Pancreas: No mass, inflammation or ductal dilatation. Spleen: Normal size.  No focal lesions. Adrenals/Urinary Tract: The adrenal glands and kidneys are unremarkable. Small bilateral renal calculi but no obstructing ureteral calculi or bladder calculi. Stomach/Bowel: The stomach, duodenum and small bowel are unremarkable. No acute inflammatory changes, mass lesions or obstructive findings. There is an inflammatory process in the right pelvis with a 5.4 x 4.7 cm abscess. This is located between the cecal tip/terminal ileum and the sigmoid colon. Although this could be ruptured appendicitis I think it is more likely a diverticular abscess extending off of the right wall of the sigmoid colon. There is underlying sigmoid diverticulosis and on the coronal images there appears to be some air connecting the abscess with the colon. Significant surrounding inflammatory phlegmon involving the dome region of the bladder. Vascular/Lymphatic: Moderate atherosclerotic calcifications involving the aorta and iliac arteries. Scattered borderline mesenteric and retroperitoneal lymph nodes likely inflammatory/hyperplastic. No pelvic  adenopathy. Borderline enlarged inguinal lymph nodes. Reproductive: The prostate gland and seminal vesicles are grossly normal. A penile prosthesis is noted. Other: No abdominal wall hernia or subcutaneous lesions. No ascites. Musculoskeletal: No significant bony findings. Degenerative changes involving the spine and spinal fusion hardware noted. IMPRESSION: 1. 5 cm right-sided pelvic abscess most likely related to sigmoid diverticulitis. It is possible this is ruptured appendicitis if the patient still has his appendix. I think that is less likely. There is significant surrounding inflammatory phlegmon  involving the terminal ileum and also the dome of the bladder. 2. Small bilateral renal calculi but no obstructing ureteral calculi or bladder calculi. 3. Status post cholecystectomy.  No biliary dilatation. These results will be called to the ordering clinician or representative by the Radiologist Assistant, and communication documented in the PACS or zVision Dashboard. Electronically Signed   By: Marijo Sanes M.D.   On: 10/31/2017 16:41   Ct Image Guided Drainage By Percutaneous Catheter  Result Date: 11/04/2017 INDICATION: 62 year old male with a history of lower abdominal abscess secondary to diverticulitis EXAM: CT-GUIDED DRAINAGE ABDOMINAL ABSCESS MEDICATIONS: The patient is currently admitted to the hospital and receiving intravenous antibiotics. The antibiotics were administered within an appropriate time frame prior to the initiation of the procedure. ANESTHESIA/SEDATION: 1.5 mg IV Versed 75 mcg IV Fentanyl Moderate Sedation Time:  13 minutes The patient was continuously monitored during the procedure by the interventional radiology nurse under my direct supervision. COMPLICATIONS: None TECHNIQUE: Informed written consent was obtained from the patient after a thorough discussion of the procedural risks, benefits and alternatives. All questions were addressed. Maximal Sterile Barrier Technique was  utilized including caps, mask, sterile gowns, sterile gloves, sterile drape, hand hygiene and skin antiseptic. A timeout was performed prior to the initiation of the procedure. PROCEDURE: The operative field was prepped with Chlorhexidine in a sterile fashion, and a sterile drape was applied covering the operative field. A sterile gown and sterile gloves were used for the procedure. Local anesthesia was provided with 1% Lidocaine. Once the patient is prepped and draped in the usual sterile fashion, 1% lidocaine was used for local anesthesia. Using modified Seldinger technique, a 10 French drain was placed in the abscess cavity within the lower abdomen. Proximally 25 cc of purulent in slightly bloody fluid aspirated. Sample was sent for culture. Catheter was sutured in position and attached to bulb suction. Patient tolerated the procedure well and remained hemodynamically stable throughout. No complications were encountered and no significant blood loss. FINDINGS: [facility] CT demonstrates abscess within the lower abdomen, just superior to the urinary bladder and associated with sigmoid colon and the sigmoid colon inflammatory changes. Status post drainage there is a 10 French catheter within the abscess cavity which is collapsed. IMPRESSION: Status post CT-guided drainage of abdominal abscess. Signed, Dulcy Fanny. Earleen Newport, DO [facility] Radiology Electronically Signed   By: Corrie Mckusick D.O.   On: 11/04/2017 15:09     PERTINENT LAB RESULTS: CBC: Recent Labs    11/08/17 0626  WBC 6.5  HGB 8.6*  HCT 25.6*  PLT 189   CMET CMP     Component Value Date/Time   NA 138 11/10/2017 0609   K 3.7 11/10/2017 0609   CL 103 11/10/2017 0609   CO2 25 11/10/2017 0609   GLUCOSE 110 (H) 11/10/2017 0609   BUN 14 11/10/2017 0609   CREATININE 1.51 (H) 11/10/2017 0609   CREATININE 1.00 02/01/2012 1028   CALCIUM 8.5 (L) 11/10/2017 0609   PROT 4.9 (L) 11/05/2017 0707    ALBUMIN 2.4 (L) 11/05/2017 0707   AST 13 (L) 11/05/2017 0707   ALT 11 (L) 11/05/2017 0707   ALKPHOS 86 11/05/2017 0707   BILITOT 0.5 11/05/2017 0707   GFRNONAA 47 (L) 11/10/2017 0609   GFRNONAA 83 02/01/2012 1028   GFRAA 55 (L) 11/10/2017 0609   GFRAA >89 02/01/2012 1028    GFR Estimated Creatinine Clearance: 68.6 mL/min (A) (by C-G formula based on SCr of 1.51 mg/dL (H)). No  results for input(s): LIPASE, AMYLASE in the last 72 hours. No results for input(s): CKTOTAL, CKMB, CKMBINDEX, TROPONINI in the last 72 hours. Invalid input(s): POCBNP No results for input(s): DDIMER in the last 72 hours. No results for input(s): HGBA1C in the last 72 hours. No results for input(s): CHOL, HDL, LDLCALC, TRIG, CHOLHDL, LDLDIRECT in the last 72 hours. No results for input(s): TSH, T4TOTAL, T3FREE, THYROIDAB in the last 72 hours.  Invalid input(s): FREET3 No results for input(s): VITAMINB12, FOLATE, FERRITIN, TIBC, IRON, RETICCTPCT in the last 72 hours. Coags: No results for input(s): INR in the last 72 hours.  Invalid input(s): PT Microbiology: Recent Results (from the past 240 hour(s))  Blood culture (routine x 2)     Status: None   Collection Time: 11/04/17  7:43 AM  Result Value Ref Range Status   Specimen Description BLOOD RIGHT ANTECUBITAL  Final   Special Requests   Final    BOTTLES DRAWN AEROBIC AND ANAEROBIC Blood Culture adequate volume   Culture   Final    NO GROWTH 5 DAYS Performed at [facility], [address], [place], [place] [address]    Report Status 11/09/2017 FINAL  Final  Blood culture (routine x 2)     Status: Abnormal (Preliminary result)   Collection Time: 11/04/17  8:54 AM  Result Value Ref Range Status   Specimen Description BLOOD RIGHT FOREARM  Final   Special Requests   Final    BOTTLES DRAWN AEROBIC AND ANAEROBIC Blood Culture adequate volume   Culture  Setup Time   Final    GRAM NEGATIVE RODS AEROBIC BOTTLE ONLY CRITICAL RESULT CALLED TO, READ  BACK BY AND VERIFIED WITH: J [facility] 11/07/17 1202 JDW    Culture (A)  Final    ACINETOBACTER URSINGII Sent to [place] for further susceptibility testing. Performed at [facility], [place] [address] [place],  [address]    Report Status PENDING  Incomplete  Blood Culture ID Panel (Reflexed)     Status: None   Collection Time: 11/04/17  8:54 AM  Result Value Ref Range Status   Enterococcus species NOT DETECTED NOT DETECTED Final   Listeria monocytogenes NOT DETECTED NOT DETECTED Final    Comment: CRITICAL RESULT CALLED TO, READ BACK BY AND VERIFIED WITH: J [facility] PHARMD 11/07/17 0002 JDW    Staphylococcus species NOT DETECTED NOT DETECTED Final   Staphylococcus aureus NOT DETECTED NOT DETECTED Final   Streptococcus species NOT DETECTED NOT DETECTED Final   Streptococcus agalactiae NOT DETECTED NOT DETECTED Final   Streptococcus pneumoniae NOT DETECTED NOT DETECTED Final   Streptococcus pyogenes NOT DETECTED NOT DETECTED Final   Acinetobacter baumannii NOT DETECTED NOT DETECTED Final   Enterobacteriaceae species NOT DETECTED NOT DETECTED Final   Enterobacter cloacae complex NOT DETECTED NOT DETECTED Final   Escherichia coli NOT DETECTED NOT DETECTED Final   Klebsiella oxytoca NOT DETECTED NOT DETECTED Final   Klebsiella pneumoniae NOT DETECTED NOT DETECTED Final   Proteus species NOT DETECTED NOT DETECTED Final   Serratia marcescens NOT DETECTED NOT DETECTED Final   Haemophilus influenzae NOT DETECTED NOT DETECTED Final   Neisseria meningitidis NOT DETECTED NOT DETECTED Final   Pseudomonas aeruginosa NOT DETECTED NOT DETECTED Final   Candida albicans NOT DETECTED NOT DETECTED Final   Candida glabrata NOT DETECTED NOT DETECTED Final   Candida krusei NOT DETECTED NOT DETECTED Final   Candida parapsilosis NOT DETECTED NOT DETECTED Final   Candida tropicalis NOT DETECTED NOT DETECTED Final  Comment: Performed at [facility], [place] [address], [place],  [place] [address]  Aerobic/Anaerobic Culture (surgical/deep wound)     Status: Abnormal   Collection Time: 11/04/17 12:40 PM  Result Value Ref Range Status   Specimen Description ABSCESS  Final   Special Requests NONE  Final   Gram Stain   Final    ABUNDANT WBC PRESENT, PREDOMINANTLY PMN MODERATE GRAM NEGATIVE RODS MODERATE GRAM POSITIVE COCCI Performed at [facility], [address] [place], [place] [address]    Culture (A)  Final    MULTIPLE ORGANISMS PRESENT, NONE PREDOMINANT MIXED ANAEROBIC FLORA PRESENT.  CALL LAB IF FURTHER IID REQUIRED.    Report Status 11/06/2017 FINAL  Final  MRSA PCR Screening     Status: Abnormal   Collection Time: 11/04/17 10:26 PM  Result Value Ref Range Status   MRSA by PCR POSITIVE (A) NEGATIVE Final    Comment:        The GeneXpert MRSA Assay (FDA approved for NASAL specimens only), is one component of a comprehensive MRSA colonization surveillance program. It is not intended to diagnose MRSA infection nor to guide or monitor treatment for MRSA infections. CRITICAL RESULT CALLED TO, READ BACK BY AND VERIFIED WITH: Mellissa Kohut RN 11/05/17 0300 JDW Performed at [facility], [address], [place],  [address]     FURTHER DISCHARGE INSTRUCTIONS:  Get Medicines reviewed and adjusted: Please take all your medications with you for your next visit with your Primary MD  Laboratory/radiological data: Please request your Primary MD to go over all hospital tests and procedure/radiological results at the follow up, please ask your Primary MD to get all Hospital records sent to his/her office.  In some cases, they will be blood work, cultures and biopsy results pending at the time of your discharge. Please request that your primary care M.D. goes through all the records of your hospital data and follows up on these results.  Also Note the following: If you experience worsening of your admission symptoms, develop shortness of breath, life  threatening emergency, suicidal or homicidal thoughts you must seek medical attention immediately by calling 911 or calling your MD immediately  if symptoms less severe.  You must read complete instructions/literature along with all the possible adverse reactions/side effects for all the Medicines you take and that have been prescribed to you. Take any new Medicines after you have completely understood and accpet all the possible adverse reactions/side effects.   Do not drive when taking Pain medications or sleeping medications (Benzodaizepines)  Do not take more than prescribed Pain, Sleep and Anxiety Medications. It is not advisable to combine anxiety,sleep and pain medications without talking with your primary care practitioner  Special Instructions: If you have smoked or chewed Tobacco  in the last 2 yrs please stop smoking, stop any regular Alcohol  and or any Recreational drug use.  Wear Seat belts while driving.  Please note: You were cared for by a hospitalist during your hospital stay. Once you are discharged, your primary care physician will handle any further medical issues. Please note that NO REFILLS for any discharge medications will be authorized once you are discharged, as it is imperative that you return to your primary care physician (or establish a relationship with a primary care physician if you do not have one) for your post hospital discharge needs so that they can reassess your need for medications and monitor your lab values.  Total Time spent coordinating  discharge including counseling, education and face to face time equals 45 minutes.  SignedOren Binet 11/10/2017 12:01 PM

## 2017-11-11 ENCOUNTER — Other Ambulatory Visit: Payer: Self-pay | Admitting: Surgery

## 2017-11-11 DIAGNOSIS — K651 Peritoneal abscess: Secondary | ICD-10-CM

## 2017-11-12 DIAGNOSIS — E1122 Type 2 diabetes mellitus with diabetic chronic kidney disease: Secondary | ICD-10-CM | POA: Diagnosis not present

## 2017-11-12 DIAGNOSIS — Z4682 Encounter for fitting and adjustment of non-vascular catheter: Secondary | ICD-10-CM | POA: Diagnosis not present

## 2017-11-12 DIAGNOSIS — K5720 Diverticulitis of large intestine with perforation and abscess without bleeding: Secondary | ICD-10-CM | POA: Diagnosis not present

## 2017-11-13 DIAGNOSIS — E1140 Type 2 diabetes mellitus with diabetic neuropathy, unspecified: Secondary | ICD-10-CM | POA: Diagnosis not present

## 2017-11-13 DIAGNOSIS — I5032 Chronic diastolic (congestive) heart failure: Secondary | ICD-10-CM | POA: Diagnosis not present

## 2017-11-13 DIAGNOSIS — I2510 Atherosclerotic heart disease of native coronary artery without angina pectoris: Secondary | ICD-10-CM | POA: Diagnosis not present

## 2017-11-13 LAB — SUSCEPTIBILITY RESULT

## 2017-11-13 LAB — SUSCEPTIBILITY, AER + ANAEROB

## 2017-11-14 DIAGNOSIS — Z4682 Encounter for fitting and adjustment of non-vascular catheter: Secondary | ICD-10-CM | POA: Diagnosis not present

## 2017-11-14 DIAGNOSIS — K5720 Diverticulitis of large intestine with perforation and abscess without bleeding: Secondary | ICD-10-CM | POA: Diagnosis not present

## 2017-11-14 DIAGNOSIS — E1122 Type 2 diabetes mellitus with diabetic chronic kidney disease: Secondary | ICD-10-CM | POA: Diagnosis not present

## 2017-11-15 LAB — CULTURE, BLOOD (ROUTINE X 2)
Culture: NO GROWTH
Culture: NO GROWTH
Special Requests: ADEQUATE
Special Requests: ADEQUATE
Special Requests: ADEQUATE

## 2017-11-16 DIAGNOSIS — Z4682 Encounter for fitting and adjustment of non-vascular catheter: Secondary | ICD-10-CM | POA: Diagnosis not present

## 2017-11-16 DIAGNOSIS — K5720 Diverticulitis of large intestine with perforation and abscess without bleeding: Secondary | ICD-10-CM | POA: Diagnosis not present

## 2017-11-16 DIAGNOSIS — E1122 Type 2 diabetes mellitus with diabetic chronic kidney disease: Secondary | ICD-10-CM | POA: Diagnosis not present

## 2017-11-18 DIAGNOSIS — G894 Chronic pain syndrome: Secondary | ICD-10-CM | POA: Diagnosis not present

## 2017-11-18 DIAGNOSIS — K651 Peritoneal abscess: Secondary | ICD-10-CM | POA: Diagnosis not present

## 2017-11-19 ENCOUNTER — Ambulatory Visit
Admission: RE | Admit: 2017-11-19 | Discharge: 2017-11-19 | Disposition: A | Discharge status: Home / Self Care | Payer: 59 | Source: Ambulatory Visit | Attending: Surgery | Admitting: Surgery

## 2017-11-19 ENCOUNTER — Encounter: Payer: Self-pay | Admitting: Radiology

## 2017-11-19 ENCOUNTER — Other Ambulatory Visit: Payer: Self-pay | Admitting: Surgery

## 2017-11-19 ENCOUNTER — Ambulatory Visit
Admission: RE | Admit: 2017-11-19 | Discharge: 2017-11-19 | Disposition: A | Discharge status: Home / Self Care | Payer: 59 | Source: Ambulatory Visit | Attending: Radiology | Admitting: Radiology

## 2017-11-19 DIAGNOSIS — K651 Peritoneal abscess: Secondary | ICD-10-CM

## 2017-11-19 DIAGNOSIS — K5780 Diverticulitis of intestine, part unspecified, with perforation and abscess without bleeding: Secondary | ICD-10-CM | POA: Diagnosis not present

## 2017-11-19 DIAGNOSIS — Z434 Encounter for attention to other artificial openings of digestive tract: Secondary | ICD-10-CM | POA: Diagnosis not present

## 2017-11-19 HISTORY — PX: IR RADIOLOGIST EVAL & MGMT: IMG5224

## 2017-11-19 NOTE — Progress Notes (Signed)
Chief Complaintc Patient was seen in consultation today for follow up diverticular abscess drain at the request of Dr. Jens Som  Referring Physician(s): Dr. Windle Guard  History of Present Illness: [name] is a 62 y.o. male with diverticular abscess. He had perc drain placed on 2/25. He improved clinically and was discharged home. He reports continued output from his drain, though it has decreased in amount.  Reports some continued low abd soreness as well. Bowels moving fine. Denies dysuria, hematuria, pyuria. He is here for repeat CT and drain injection today.  Past Medical History:  Diagnosis Date  . Barrett's esophagus   . Carotid artery occlusion 11/10/10   LEFT CAROTID ENDARTERECTOMY  . CHF (congestive heart failure) ([place])   . Complication of anesthesia    BP WENT UP AT [facility] "  . COPD (chronic obstructive pulmonary disease) ([place])   . Coronary artery disease   . Diabetes mellitus   . Diverticulitis   . Diverticulosis of colon (without mention of hemorrhage)   . DJD (degenerative joint disease)   . Fatty liver   . Full dentures   . GERD (gastroesophageal reflux disease)   . H/O Doppler ultrasound 06/28/2012   Lower venous duplex left-- findings consistent with acute deep vein thrombosis involving the personal vein of the left lower extremity. No evidence of Baker's cyst on the left. Duplex scan of the left lower extremity arteries reveal adequate blood flow.   . H/O hiatal hernia   . History of cardiovascular stress test 03/23/2011   ejection fraction measured 51%, with an end-diastolic volume of 778 ml and an end-systolic volume of 81 ml. no evidence of myocardial ischemia.  Marland Kitchen History of echocardiogram 08/22/2011   Mildly hypertrophic left ventricle with normal systolic function and mild diastolic dysfunction. mildly dilated left atrium. no significant valvular abnormalities. no pericardial effusion no change from previous study 10/2010. the ejection fraction  =>55%  . History of Holter monitoring 02/14/2012   Scan quality is fair due to baseline artifact. Predominant rhythm is Sinus. Veentricular Ectopy was noted in single beat form. The total recording time was 24:00 hours. total beats =[id number]. the total time analyzed was 23:58 hours. Heart  Max = 102 at 1:18:22, Min= 52 at 9:56:19 am., Avg= 70. There were 0 pause(s) > 2.5 seconds. the longest pause = 0.00 sec. at 1:51 pm(1)  . Hyperlipidemia   . Hypertension   . Neuromuscular disorder ([place])    peripheral neuropathy  . OSA (obstructive sleep apnea) 12/26/2008   took himself off CPAP; AHI of 6.3/hr overall, 32.7/hr during REM sleep and an RDI of 24.8/hr and 50.9/hr.  . Osteomyelitis ([place])    left 5th metatarsal  . PAD (peripheral artery disease) ([place])    Distal aortogram June 2012. Atherectomy left popliteal artery July 2012.   Marland Kitchen Slurred speech    AS PER WIFE IN D/C NOTE 11/10/10  . Substernal chest pain   . Visual color changes    LEFT EYE; BUT NOT TOTAL BLINDNESS  . Wears glasses     Past Surgical History:  Procedure Laterality Date  . AMPUTATION  11/05/2011   Procedure: AMPUTATION RAY;  Surgeon: Wylene Simmer, MD;  Location: [place];  Service: Orthopedics;  Laterality: Right;  Amputation of Right 4&5th Toes  . AMPUTATION Left 11/26/2012   Procedure: AMPUTATION RAY;  Surgeon: Wylene Simmer, MD;  Location: [place];  Service: Orthopedics;  Laterality: Left;  fourth ray amputation  . AMPUTATION Right 08/27/2014   Procedure:  Transmetatarsal Amputation;  Surgeon: Newt Minion, MD;  Location: [place];  Service: Orthopedics;  Laterality: Right;  . AMPUTATION Right 01/14/2015   Procedure: AMPUTATION BELOW KNEE;  Surgeon: Newt Minion, MD;  Location: [place];  Service: Orthopedics;  Laterality: Right;  . AMPUTATION Left 10/21/2015   Procedure: Left Foot 5th Ray Amputation;  Surgeon: Newt Minion, MD;  Location: [place];  Service: Orthopedics;  Laterality: Left;  . ANTERIOR FUSION CERVICAL SPINE  02/06/06   C4-5,  C5-6, C6-7; SURGEON DR. MAX COHEN  . CARDIAC CATHETERIZATION  10/31/04   2009  . CAROTID ENDARTERECTOMY  11/10/10  . CAROTID ENDARTERECTOMY Left 11/10/2010   Subtotal occlusion of left internal carotid artery with left hemispheric transient ischemic attacks.  . CARPAL TUNNEL RELEASE Right 10/21/2013   Procedure: RIGHT CARPAL TUNNEL RELEASE;  Surgeon: Wynonia Sours, MD;  Location: [place];  Service: Orthopedics;  Laterality: Right;  . CHOLECYSTECTOMY    . COLONOSCOPY    . ESOPHAGEAL MANOMETRY Bilateral 07/19/2014   Procedure: ESOPHAGEAL MANOMETRY (EM);  Surgeon: Jerene Bears, MD;  Location: [facility];  Service: Gastroenterology;  Laterality: Bilateral;  . FEMORAL ARTERY STENT     x6  . FOOT SURGERY  04/25/2016    EXCISION BASE 5TH METATARSAL AND PARTIAL CUBOID LEFT FOOT  . HERNIA REPAIR     LEFT INGUINAL AND UMBILICAL REPAIRS  . I&D EXTREMITY Left 04/25/2016   Procedure: EXCISION BASE 5TH METATARSAL AND PARTIAL CUBOID LEFT FOOT;  Surgeon: Newt Minion, MD;  Location: [place];  Service: Orthopedics;  Laterality: Left;  . JOINT REPLACEMENT Right 2001   Total  . LAMINECTOMY     X 3 LUMBAR AND X 2 CERVICAL SPINE OPERATIONS  . LAPAROSCOPIC CHOLECYSTECTOMY W/ CHOLANGIOGRAPHY  11/09/04   SURGEON DR. Luella Cook  . LEFT HEART CATHETERIZATION WITH CORONARY ANGIOGRAM N/A 10/29/2014   Procedure: LEFT HEART CATHETERIZATION WITH CORONARY ANGIOGRAM;  Surgeon: Laverda Page, MD;  Location: [facility] C[facility] LAB;  Service: Cardiovascular;  Laterality: N/A;  . LOWER EXTREMITY ANGIOGRAM N/A 03/19/2012   Procedure: LOWER EXTREMITY ANGIOGRAM;  Surgeon: Burnell Blanks, MD;  [facility] C[facility] LAB;  Service: Cardiovascular;  Laterality: N/A;  . NECK SURGERY    . PENILE PROSTHESIS IMPLANT  08/14/05   INFRAPUBIC INSERTION OF INFLATABLE PENILE PROSTHESIS; SURGEON DR. Amalia Hailey  . PERCUTANEOUS CORONARY STENT INTERVENTION (PCI-S) Right 10/29/2014   Procedure: PERCUTANEOUS CORONARY STENT INTERVENTION  (PCI-S);  Surgeon: Laverda Page, MD;  Location: [facility] CATH LAB;  Service: Cardiovascular;  Laterality: Right;  . SHOULDER ARTHROSCOPY    . SPINE SURGERY    . TONSILLECTOMY    . TOTAL KNEE ARTHROPLASTY  07/2002   RIGHT KNEE ; SURGEON  DR. [name] ALSO HAD ARTHROSCOPIC RIGHT KNEE IN  10/2001  . ULNAR NERVE TRANSPOSITION Right 10/21/2013   Procedure: RIGHT ELBOW  ULNAR NERVE DECOMPRESSION;  Surgeon: Wynonia Sours, MD;  Location: [place];  Service: Orthopedics;  Laterality: Right;    Allergies: Ivp dye [iodinated diagnostic agents] and Adhesive [tape]  Medications: Prior to Admission medications   Medication Sig Start Date End Date Taking? Authorizing Provider  amLODipine-olmesartan (AZOR) 5-20 MG tablet Take 2 tablets by mouth every evening. 08/01/16   Adrian Prows, MD  amoxicillin-clavulanate (AUGMENTIN) 875-125 MG tablet Take 1 tablet by mouth every 12 (twelve) hours. 11/10/17   Ghimire, Henreitta Leber, MD  aspirin EC 81 MG tablet Take 81 mg by mouth daily.    [provider]  atorvastatin (LIPITOR) 40 MG tablet Take 40 mg by mouth daily.    [provider]  carvedilol (COREG) 25 MG tablet Take 1.5 tablets (37.5 mg total) by mouth 2 (two) times daily. 08/01/16   Adrian Prows, MD  clobetasol (TEMOVATE) 0.05 % external solution Apply 1 application topically daily as needed (dry scalp).    [provider]  clopidogrel (PLAVIX) 75 MG tablet Take 75 mg by mouth daily.  03/28/12   [provider]  clotrimazole-betamethasone (LOTRISONE) cream Apply 1 application topically daily as needed (dry skin - beard).     [provider]  collagenase (SANTYL) ointment Apply topically daily. Patient taking differently: Apply 1 application topically daily. TO AFFECTED SITE(S) 09/06/15   Lavina Hamman, MD  fentaNYL (DURAGESIC - DOSED MCG/HR) 50 MCG/HR Place 1 patch (50 mcg total) onto the skin every 3 (three) days. 05/10/16   Orson Eva, MD  fluticasone  (CUTIVATE) 0.05 % cream Apply 1 application topically 2 (two) times daily as needed (dry skin on face).     [provider]  glyBURIDE-metformin (GLUCOVANCE) 2.5-500 MG per tablet Take 2 tablets by mouth 2 (two) times daily with a meal.  03/05/12   [provider]  HYDROmorphone (DILAUDID) 2 MG tablet Take 1 tablet (2 mg total) by mouth at bedtime. Patient taking differently: Take 2 mg by mouth at bedtime as needed (for breakthrough pain).  05/10/16   Orson Eva, MD  insulin glargine (LANTUS) 100 UNIT/ML injection Inject 0.6 mLs (60 Units total) into the skin at bedtime. Patient taking differently: Inject 50 Units into the skin at bedtime.  05/11/16   Orson Eva, MD  Insulin Glulisine (APIDRA) 100 UNIT/ML Solostar Pen Inject 13 Units into the skin 3 (three) times daily with meals. Patient taking differently: Inject 15-18 Units into the skin 3 (three) times daily as needed (for CBG). Sliding scale 05/11/16   Tat, Shanon Brow, MD  isosorbide mononitrate (IMDUR) 120 MG 24 hr tablet Take 120 mg by mouth daily.    [provider]  Linaclotide Rolan Lipa) 145 MCG CAPS capsule Take 1 capsule (145 mcg total) by mouth daily as needed. Patient taking differently: Take 145 mcg by mouth daily as needed (constipation).  01/20/15   Florencia Reasons, MD  Multiple Vitamin (MULTIVITAMIN WITH MINERALS) TABS tablet Take 1 tablet by mouth daily.    [provider]  nitroGLYCERIN (NITRO-BID) 2 % ointment Apply 0.75 inches topically daily as needed for chest pain.     [provider]  nitroGLYCERIN (NITRODUR - DOSED IN MG/24 HR) 0.2 mg/hr patch Place 1 patch (0.2 mg total) onto the skin daily. 01/08/17   Suzan Slick, NP  nitroGLYCERIN (NITROSTAT) 0.4 MG SL tablet Place 1 tablet (0.4 mg total) under the tongue every 5 (five) minutes as needed (as needed for esophageal spasm). Patient taking differently: Place 0.4 mg under the tongue every 5 (five) minutes as needed for chest pain.  08/09/14   Pyrtle,  Lajuan Lines, MD  omega-3 acid ethyl esters (LOVAZA) 1 G capsule Take 2 g by mouth 2 (two) times daily.    [provider]  ondansetron (ZOFRAN-ODT) 4 MG disintegrating tablet Take 4 mg by mouth 4 (four) times daily as needed for nausea.    [provider]  oxyCODONE-acetaminophen (PERCOCET/ROXICET) 5-325 MG tablet Take 1 tablet by mouth every 8 (eight) hours as needed. Patient taking differently: Take 1 tablet by mouth 2 (two) times daily as needed for moderate pain.  05/10/16  Orson Eva, MD  pantoprazole (PROTONIX) 40 MG tablet Take 1 tablet (40 mg total) by mouth daily before breakfast. 10/30/14   Adrian Prows, MD  pregabalin (LYRICA) 100 MG capsule Take 1 capsule (100 mg total) by mouth 3 (three) times daily. 05/10/16   Orson Eva, MD  Probiotic CAPS Take 2 capsules by mouth daily after breakfast.    [provider]  Propylene Glycol (SYSTANE BALANCE OP) Place 2-3 drops into both eyes daily as needed (for dry eyes).    [provider]  rOPINIRole (REQUIP) 2 MG tablet Take 2 mg by mouth at bedtime.     [provider]  Tamsulosin HCl (FLOMAX) 0.4 MG CAPS Take 0.4 mg by mouth daily.    [provider]  torsemide (DEMADEX) 20 MG tablet Take 20 mg by mouth every morning. MAY TAKE AN EXTRA 20 MG ONCE A DAY FOR EXCESSIVE FLUID RETENTION    [provider]     Family History  Problem Relation Age of Onset  . Heart disease Father        Before age 41-  CAD, BPG  . Diabetes Father        Amputation  . Cancer Father        PROSTATE  . Hyperlipidemia Father   . Hypertension Father   . Heart attack Father        Triple BPG  . Varicose Veins Father   . Colon cancer Brother   . Cancer Brother        Colon  . Diabetes Brother   . Heart disease Brother 41       A-Fib. Before age 17  . Hyperlipidemia Brother   . Hypertension Brother   . Cancer Sister        Breast  . Hyperlipidemia Sister   . Hypertension Sister   . Hypertension Son   .  Arthritis Unknown        GRANDMOTHER  . Hypertension Unknown        OTHER FAMILY MEMBERS    Social History   Socioeconomic History  . Marital status: Married    Spouse name: Not on file  . Number of children: Not on file  . Years of education: Not on file  . Highest education level: Not on file  Social Needs  . Financial resource strain: Not on file  . Food insecurity - worry: Not on file  . Food insecurity - inability: Not on file  . Transportation needs - medical: Not on file  . Transportation needs - non-medical: Not on file  Occupational History  . Occupation: Magazine features editor: UNEMPLOYED  Tobacco Use  . Smoking status: Current Every Day Smoker    Packs/day: 0.50    Years: 35.00    Pack years: 17.50    Types: E-cigarettes    Last attempt to quit: 10/28/2011    Years since quitting: 6.0  . Smokeless tobacco: Never Used  . Tobacco comment: VAPOR CIGARETTES (Water only)  Substance and Sexual Activity  . Alcohol use: No    Alcohol/week: 0.0 oz    Comment: "not in a long time"  . Drug use: No  . Sexual activity: Yes    Birth control/protection: Implant  Other Topics Concern  . Not on file  Social History Narrative   HEAVY SMOKER AND CONTINUES TO SMOKE 1 PPD. DOES NOT EXERCISE REGULARLY.    Wife [phone] Lebron Quam). Has 2 sons and daughter. Still active  Review of Systems: A 12 point ROS discussed and pertinent positives are indicated in the HPI above.  All other systems are negative.  Review of Systems  Vital Signs: There were no vitals taken for this visit.  Physical Exam  Constitutional: He is oriented to person, place, and time. He appears well-developed. No distress.  HENT:  Head: Normocephalic.  Mouth/Throat: Oropharynx is clear and moist.  Cardiovascular: Normal rate, regular rhythm and normal heart sounds.  Pulmonary/Chest: Effort normal and breath sounds normal. No respiratory distress.  Abdominal: Soft. He exhibits no  distension. There is no tenderness.  Drain intact, site clean. Output turbid brownish-red with feculent odor.  Neurological: He is alert and oriented to person, place, and time.  Skin: Skin is warm and dry.     Imaging: Ct Abdomen Pelvis Wo Contrast  Result Date: 11/19/2017 CLINICAL DATA:  Status post percutaneous catheter drainage sigmoid diverticular abscess 11/04/2017. EXAM: CT ABDOMEN AND PELVIS WITHOUT CONTRAST TECHNIQUE: Multidetector CT imaging of the abdomen and pelvis was performed following the standard protocol without IV contrast. COMPARISON:  10/31/2017 FINDINGS: Lower chest: No acute abnormality. Hepatobiliary: No focal liver abnormality is seen. Status post cholecystectomy. No biliary dilatation. Pancreas: Unremarkable. No pancreatic ductal dilatation or surrounding inflammatory changes. Spleen: Normal in size without focal abnormality. Adrenals/Urinary Tract: No adrenal masses. Stable small nonobstructing calculi in the lower pole collecting systems of both kidneys. The bladder is decompressed. Stomach/Bowel: No evidence of bowel obstruction or free air. Inflammation anterior to the mid sigmoid colon has improved. Percutaneous catheter present in the midline with collapse the diverticular abscess. A small amount of air remains around the distal portion of the catheter without significant residual fluid. Vascular/Lymphatic: No significant vascular findings are present. No enlarged abdominal or pelvic lymph nodes. Reproductive: Prostate is unremarkable.  Penile prosthesis present. Other: No abdominal wall hernia or abnormality. No abdominopelvic ascites. Musculoskeletal: Degenerative disc disease and status post posterior lumbar fusion at L4-5 and L5-S1. IMPRESSION: Resolution of fluid component of sigmoid diverticular abscess with small amount of air present around the drainage catheter. This catheter will be injected with contrast under fluoroscopy to evaluate for fistula. Electronically  Signed   By: Aletta Edouard M.D.   On: 11/19/2017 14:31   Ct Abdomen Pelvis Wo Contrast  Result Date: 10/31/2017 CLINICAL DATA:  Abdominal pain and swelling for 4 weeks. EXAM: CT ABDOMEN AND PELVIS WITHOUT CONTRAST TECHNIQUE: Multidetector CT imaging of the abdomen and pelvis was performed following the standard protocol without IV contrast. COMPARISON:  None. FINDINGS: Lower chest: The lung bases are clear. No pleural effusion or worrisome pulmonary lesions. The heart is normal in size. Coronary artery calcifications are noted. The distal esophagus is grossly normal. Hepatobiliary: No focal hepatic lesions or intrahepatic biliary dilatation. The gallbladder is surgically absent. Mild associated common bile duct dilatation. Pancreas: No mass, inflammation or ductal dilatation. Spleen: Normal size.  No focal lesions. Adrenals/Urinary Tract: The adrenal glands and kidneys are unremarkable. Small bilateral renal calculi but no obstructing ureteral calculi or bladder calculi. Stomach/Bowel: The stomach, duodenum and small bowel are unremarkable. No acute inflammatory changes, mass lesions or obstructive findings. There is an inflammatory process in the right pelvis with a 5.4 x 4.7 cm abscess. This is located between the cecal tip/terminal ileum and the sigmoid colon. Although this could be ruptured appendicitis I think it is more likely a diverticular abscess extending off of the right wall of the sigmoid colon. There is underlying sigmoid diverticulosis and on the coronal images there  appears to be some air connecting the abscess with the colon. Significant surrounding inflammatory phlegmon involving the dome region of the bladder. Vascular/Lymphatic: Moderate atherosclerotic calcifications involving the aorta and iliac arteries. Scattered borderline mesenteric and retroperitoneal lymph nodes likely inflammatory/hyperplastic. No pelvic adenopathy. Borderline enlarged inguinal lymph nodes. Reproductive: The  prostate gland and seminal vesicles are grossly normal. A penile prosthesis is noted. Other: No abdominal wall hernia or subcutaneous lesions. No ascites. Musculoskeletal: No significant bony findings. Degenerative changes involving the spine and spinal fusion hardware noted. IMPRESSION: 1. 5 cm right-sided pelvic abscess most likely related to sigmoid diverticulitis. It is possible this is ruptured appendicitis if the patient still has his appendix. I think that is less likely. There is significant surrounding inflammatory phlegmon involving the terminal ileum and also the dome of the bladder. 2. Small bilateral renal calculi but no obstructing ureteral calculi or bladder calculi. 3. Status post cholecystectomy.  No biliary dilatation. These results will be called to the ordering clinician or representative by the Radiologist Assistant, and communication documented in the PACS or zVision Dashboard. Electronically Signed   By: Marijo Sanes M.D.   On: 10/31/2017 16:41   Ct Image Guided Drainage By Percutaneous Catheter  Result Date: 11/04/2017 INDICATION: 62 year old male with a history of lower abdominal abscess secondary to diverticulitis EXAM: CT-GUIDED DRAINAGE ABDOMINAL ABSCESS MEDICATIONS: The patient is currently admitted to the hospital and receiving intravenous antibiotics. The antibiotics were administered within an appropriate time frame prior to the initiation of the procedure. ANESTHESIA/SEDATION: 1.5 mg IV Versed 75 mcg IV Fentanyl Moderate Sedation Time:  13 minutes The patient was continuously monitored during the procedure by the interventional radiology nurse under my direct supervision. COMPLICATIONS: None TECHNIQUE: Informed written consent was obtained from the patient after a thorough discussion of the procedural risks, benefits and alternatives. All questions were addressed. Maximal Sterile Barrier Technique was utilized including caps, mask, sterile gowns, sterile gloves, sterile drape,  hand hygiene and skin antiseptic. A timeout was performed prior to the initiation of the procedure. PROCEDURE: The operative field was prepped with Chlorhexidine in a sterile fashion, and a sterile drape was applied covering the operative field. A sterile gown and sterile gloves were used for the procedure. Local anesthesia was provided with 1% Lidocaine. Once the patient is prepped and draped in the usual sterile fashion, 1% lidocaine was used for local anesthesia. Using modified Seldinger technique, a 10 French drain was placed in the abscess cavity within the lower abdomen. Proximally 25 cc of purulent in slightly bloody fluid aspirated. Sample was sent for culture. Catheter was sutured in position and attached to bulb suction. Patient tolerated the procedure well and remained hemodynamically stable throughout. No complications were encountered and no significant blood loss. FINDINGS: Scout CT demonstrates abscess within the lower abdomen, just superior to the urinary bladder and associated with sigmoid colon and the sigmoid colon inflammatory changes. Status post drainage there is a 10 French catheter within the abscess cavity which is collapsed. IMPRESSION: Status post CT-guided drainage of abdominal abscess. Signed, Dulcy Fanny. Earleen Newport, DO [facility] Radiology Electronically Signed   By: Corrie Mckusick D.O.   On: 11/04/2017 15:09    Drain injection 3/12: +fistula, see full report.  Labs:  CBC: Recent Labs    11/04/17 0541 11/05/17 0707 11/07/17 0706 11/08/17 0626  WBC 6.6 6.9 6.7 6.5  HGB 10.6* 8.9* 8.3* 8.6*  HCT 31.3* 26.1* 24.5* 25.6*  PLT 265 202 171 189  COAGS: Recent Labs    11/04/17 0910  INR 1.05    BMP: Recent Labs    11/07/17 0706 11/08/17 0626 11/09/17 0321 11/10/17 0609  NA 139 136 139 138  K 3.6 3.5 3.8 3.7  CL 105 102 103 103  CO2 25 24 25 25   GLUCOSE 221* 242* 142* 110*  BUN 10 9 10 14   CALCIUM 8.0* 8.0* 8.7*  8.5*  CREATININE 1.73* 1.68* 1.44* 1.51*  GFRNONAA 40* 42* 50* 47*  GFRAA 47* 48* 58* 55*    LIVER FUNCTION TESTS: Recent Labs    11/04/17 0541 11/05/17 0707  BILITOT 0.5 0.5  AST 26 13*  ALT 13* 11*  ALKPHOS 106 86  PROT 6.1* 4.9*  ALBUMIN 3.0* 2.4*    TUMOR MARKERS: No results for input(s): AFPTM, CEA, CA199, CHROMGRNA in the last 8760 hours.  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 Will switch from bulb suction to gravity bag. Pt instructed not to flush anymore. He will see his surgeon tomorrow but we will tentatively set him up for repeat drain injection in 2 weeks.  Thank you for this interesting consult.  I greatly enjoyed meeting [name] and look forward to participating in their care.  A copy of this report was sent to the requesting provider on this date.  Electronically Signed: Ascencion Dike 11/19/2017, 2:50 PM   I spent a total of 20 minutes in face to face in clinical consultation, greater than 50% of which was counseling/coordinating care for diverticular abscess with fistula

## 2017-11-20 DIAGNOSIS — K5792 Diverticulitis of intestine, part unspecified, without perforation or abscess without bleeding: Secondary | ICD-10-CM | POA: Diagnosis not present

## 2017-11-21 DIAGNOSIS — Z4682 Encounter for fitting and adjustment of non-vascular catheter: Secondary | ICD-10-CM | POA: Diagnosis not present

## 2017-11-21 DIAGNOSIS — E1122 Type 2 diabetes mellitus with diabetic chronic kidney disease: Secondary | ICD-10-CM | POA: Diagnosis not present

## 2017-11-21 DIAGNOSIS — K5720 Diverticulitis of large intestine with perforation and abscess without bleeding: Secondary | ICD-10-CM | POA: Diagnosis not present

## 2017-11-23 DIAGNOSIS — E1122 Type 2 diabetes mellitus with diabetic chronic kidney disease: Secondary | ICD-10-CM | POA: Diagnosis not present

## 2017-11-23 DIAGNOSIS — Z4682 Encounter for fitting and adjustment of non-vascular catheter: Secondary | ICD-10-CM | POA: Diagnosis not present

## 2017-11-23 DIAGNOSIS — K5720 Diverticulitis of large intestine with perforation and abscess without bleeding: Secondary | ICD-10-CM | POA: Diagnosis not present

## 2017-11-25 DIAGNOSIS — K5720 Diverticulitis of large intestine with perforation and abscess without bleeding: Secondary | ICD-10-CM | POA: Diagnosis not present

## 2017-11-25 DIAGNOSIS — Z4682 Encounter for fitting and adjustment of non-vascular catheter: Secondary | ICD-10-CM | POA: Diagnosis not present

## 2017-11-25 DIAGNOSIS — E1122 Type 2 diabetes mellitus with diabetic chronic kidney disease: Secondary | ICD-10-CM | POA: Diagnosis not present

## 2017-11-28 DIAGNOSIS — E1122 Type 2 diabetes mellitus with diabetic chronic kidney disease: Secondary | ICD-10-CM | POA: Diagnosis not present

## 2017-11-28 DIAGNOSIS — K5720 Diverticulitis of large intestine with perforation and abscess without bleeding: Secondary | ICD-10-CM | POA: Diagnosis not present

## 2017-11-28 DIAGNOSIS — Z4682 Encounter for fitting and adjustment of non-vascular catheter: Secondary | ICD-10-CM | POA: Diagnosis not present

## 2017-12-02 DIAGNOSIS — E1122 Type 2 diabetes mellitus with diabetic chronic kidney disease: Secondary | ICD-10-CM | POA: Diagnosis not present

## 2017-12-02 DIAGNOSIS — Z4682 Encounter for fitting and adjustment of non-vascular catheter: Secondary | ICD-10-CM | POA: Diagnosis not present

## 2017-12-02 DIAGNOSIS — K5720 Diverticulitis of large intestine with perforation and abscess without bleeding: Secondary | ICD-10-CM | POA: Diagnosis not present

## 2017-12-03 ENCOUNTER — Other Ambulatory Visit (HOSPITAL_COMMUNITY): Payer: Self-pay | Admitting: Interventional Radiology

## 2017-12-03 ENCOUNTER — Encounter: Payer: Self-pay | Admitting: Radiology

## 2017-12-03 ENCOUNTER — Other Ambulatory Visit: Payer: Self-pay | Admitting: Surgery

## 2017-12-03 ENCOUNTER — Ambulatory Visit
Admission: RE | Admit: 2017-12-03 | Discharge: 2017-12-03 | Disposition: A | Discharge status: Home / Self Care | Payer: 59 | Source: Ambulatory Visit | Attending: Surgery | Admitting: Surgery

## 2017-12-03 DIAGNOSIS — K651 Peritoneal abscess: Secondary | ICD-10-CM | POA: Diagnosis not present

## 2017-12-03 HISTORY — PX: IR RADIOLOGIST EVAL & MGMT: IMG5224

## 2017-12-03 NOTE — Progress Notes (Signed)
Chief Complaint: Patient was seen in consultation today for No chief complaint on file.  at the request of [place] A  Referring Physician(s): Effie A  Supervising Physician: Daryll Brod  History of Present Illness: [name] is a 62 y.o. male with history of diabetes, right BKA, CAD who presented to the ED with abdominal pain on 11/04/2017.   He reported diffuse lower abdominal pain for the 2 weeks prior to presentation.   He had seen a primary care doctor and CT scan showed a pelvic abscess that is likely from either diverticulitis or appendix.    He underwent placement of a drain by Dr. Earleen Newport on 11/04/2017.  He is here today for follow up.  He tells me he got it caught and it pulled some. He worried the stitch came out.  On exam, it remains in good position.  He also reports he feels "air coming out from around the drain site with odor".  He is no longer flushing it.   Past Medical History:  Diagnosis Date  . Barrett's esophagus   . Carotid artery occlusion 11/10/10   LEFT CAROTID ENDARTERECTOMY  . CHF (congestive heart failure) ([place])   . Complication of anesthesia    BP WENT UP AT [facility] "  . COPD (chronic obstructive pulmonary disease) ([place])   . Coronary artery disease   . Diabetes mellitus   . Diverticulitis   . Diverticulosis of colon (without mention of hemorrhage)   . DJD (degenerative joint disease)   . Fatty liver   . Full dentures   . GERD (gastroesophageal reflux disease)   . H/O Doppler ultrasound 06/28/2012   Lower venous duplex left-- findings consistent with acute deep vein thrombosis involving the personal vein of the left lower extremity. No evidence of Baker's cyst on the left. Duplex scan of the left lower extremity arteries reveal adequate blood flow.   . H/O hiatal hernia   . History of cardiovascular stress test 03/23/2011   ejection fraction measured 51%, with an end-diastolic volume of 284 ml and an end-systolic  volume of 81 ml. no evidence of myocardial ischemia.  Marland Kitchen History of echocardiogram 08/22/2011   Mildly hypertrophic left ventricle with normal systolic function and mild diastolic dysfunction. mildly dilated left atrium. no significant valvular abnormalities. no pericardial effusion no change from previous study 10/2010. the ejection fraction =>55%  . History of Holter monitoring 02/14/2012   Scan quality is fair due to baseline artifact. Predominant rhythm is Sinus. Veentricular Ectopy was noted in single beat form. The total recording time was 24:00 hours. total beats =[id number]. the total time analyzed was 23:58 hours. Heart  Max = 102 at 1:18:22, Min= 52 at 9:56:19 am., Avg= 70. There were 0 pause(s) > 2.5 seconds. the longest pause = 0.00 sec. at 1:51 pm(1)  . Hyperlipidemia   . Hypertension   . Neuromuscular disorder ([place])    peripheral neuropathy  . OSA (obstructive sleep apnea) 12/26/2008   took himself off CPAP; AHI of 6.3/hr overall, 32.7/hr during REM sleep and an RDI of 24.8/hr and 50.9/hr.  . Osteomyelitis ([place])    left 5th metatarsal  . PAD (peripheral artery disease) ([place])    Distal aortogram June 2012. Atherectomy left popliteal artery July 2012.   Marland Kitchen Slurred speech    AS PER WIFE IN D/C NOTE 11/10/10  . Substernal chest pain   . Visual color changes    LEFT EYE; BUT NOT TOTAL BLINDNESS  . Wears glasses  Past Surgical History:  Procedure Laterality Date  . AMPUTATION  11/05/2011   Procedure: AMPUTATION RAY;  Surgeon: Wylene Simmer, MD;  Location: [place];  Service: Orthopedics;  Laterality: Right;  Amputation of Right 4&5th Toes  . AMPUTATION Left 11/26/2012   Procedure: AMPUTATION RAY;  Surgeon: Wylene Simmer, MD;  Location: [place];  Service: Orthopedics;  Laterality: Left;  fourth ray amputation  . AMPUTATION Right 08/27/2014   Procedure: Transmetatarsal Amputation;  Surgeon: Newt Minion, MD;  Location: [place];  Service: Orthopedics;  Laterality: Right;  . AMPUTATION Right 01/14/2015     Procedure: AMPUTATION BELOW KNEE;  Surgeon: Newt Minion, MD;  Location: [place];  Service: Orthopedics;  Laterality: Right;  . AMPUTATION Left 10/21/2015   Procedure: Left Foot 5th Ray Amputation;  Surgeon: Newt Minion, MD;  Location: [place];  Service: Orthopedics;  Laterality: Left;  . ANTERIOR FUSION CERVICAL SPINE  02/06/06   C4-5, C5-6, C6-7; SURGEON DR. MAX COHEN  . CARDIAC CATHETERIZATION  10/31/04   2009  . CAROTID ENDARTERECTOMY  11/10/10  . CAROTID ENDARTERECTOMY Left 11/10/2010   Subtotal occlusion of left internal carotid artery with left hemispheric transient ischemic attacks.  . CARPAL TUNNEL RELEASE Right 10/21/2013   Procedure: RIGHT CARPAL TUNNEL RELEASE;  Surgeon: Wynonia Sours, MD;  Location: [place];  Service: Orthopedics;  Laterality: Right;  . CHOLECYSTECTOMY    . COLONOSCOPY    . ESOPHAGEAL MANOMETRY Bilateral 07/19/2014   Procedure: ESOPHAGEAL MANOMETRY (EM);  Surgeon: Jerene Bears, MD;  Location: [facility];  Service: Gastroenterology;  Laterality: Bilateral;  . FEMORAL ARTERY STENT     x6  . FOOT SURGERY  04/25/2016    EXCISION BASE 5TH METATARSAL AND PARTIAL CUBOID LEFT FOOT  . HERNIA REPAIR     LEFT INGUINAL AND UMBILICAL REPAIRS  . I&D EXTREMITY Left 04/25/2016   Procedure: EXCISION BASE 5TH METATARSAL AND PARTIAL CUBOID LEFT FOOT;  Surgeon: Newt Minion, MD;  Location: [place];  Service: Orthopedics;  Laterality: Left;  . IR RADIOLOGIST EVAL & MGMT  11/19/2017  . JOINT REPLACEMENT Right 2001   Total  . LAMINECTOMY     X 3 LUMBAR AND X 2 CERVICAL SPINE OPERATIONS  . LAPAROSCOPIC CHOLECYSTECTOMY W/ CHOLANGIOGRAPHY  11/09/04   SURGEON DR. Luella Cook  . LEFT HEART CATHETERIZATION WITH CORONARY ANGIOGRAM N/A 10/29/2014   Procedure: LEFT HEART CATHETERIZATION WITH CORONARY ANGIOGRAM;  Surgeon: Laverda Page, MD;  Location: [facility] CATH LAB;  Service: Cardiovascular;  Laterality: N/A;  . LOWER EXTREMITY ANGIOGRAM N/A 03/19/2012   Procedure: LOWER  EXTREMITY ANGIOGRAM;  Surgeon: Burnell Blanks, MD;  Location: [facility] CATH LAB;  Service: Cardiovascular;  Laterality: N/A;  . NECK SURGERY    . PENILE PROSTHESIS IMPLANT  08/14/05   INFRAPUBIC INSERTION OF INFLATABLE PENILE PROSTHESIS; SURGEON DR. Amalia Hailey  . PERCUTANEOUS CORONARY STENT INTERVENTION (PCI-S) Right 10/29/2014   Procedure: PERCUTANEOUS CORONARY STENT INTERVENTION (PCI-S);  Surgeon: Laverda Page, MD;  Location: [facility] CATH LAB;  Service: Cardiovascular;  Laterality: Right;  . SHOULDER ARTHROSCOPY    . SPINE SURGERY    . TONSILLECTOMY    . TOTAL KNEE ARTHROPLASTY  07/2002   RIGHT KNEE ; SURGEON  DR. [name] ALSO HAD ARTHROSCOPIC RIGHT KNEE IN  10/2001  . ULNAR NERVE TRANSPOSITION Right 10/21/2013   Procedure: RIGHT ELBOW  ULNAR NERVE DECOMPRESSION;  Surgeon: Wynonia Sours, MD;  Location: [place];  Service: Orthopedics;  Laterality: Right;  Allergies: Ivp dye [iodinated diagnostic agents] and Adhesive [tape]  Medications: Prior to Admission medications   Medication Sig Start Date End Date Taking? Authorizing Provider  amLODipine-olmesartan (AZOR) 5-20 MG tablet Take 2 tablets by mouth every evening. 08/01/16   Adrian Prows, MD  amoxicillin-clavulanate (AUGMENTIN) 875-125 MG tablet Take 1 tablet by mouth every 12 (twelve) hours. 11/10/17   Ghimire, Henreitta Leber, MD  aspirin EC 81 MG tablet Take 81 mg by mouth daily.    [provider]  atorvastatin (LIPITOR) 40 MG tablet Take 40 mg by mouth daily.    [provider]  carvedilol (COREG) 25 MG tablet Take 1.5 tablets (37.5 mg total) by mouth 2 (two) times daily. 08/01/16   Adrian Prows, MD  clobetasol (TEMOVATE) 0.05 % external solution Apply 1 application topically daily as needed (dry scalp).    [provider]  clopidogrel (PLAVIX) 75 MG tablet Take 75 mg by mouth daily.  03/28/12   [provider]  clotrimazole-betamethasone (LOTRISONE) cream Apply 1 application topically daily as  needed (dry skin - beard).     [provider]  collagenase (SANTYL) ointment Apply topically daily. Patient taking differently: Apply 1 application topically daily. TO AFFECTED SITE(S) 09/06/15   Lavina Hamman, MD  fentaNYL (DURAGESIC - DOSED MCG/HR) 50 MCG/HR Place 1 patch (50 mcg total) onto the skin every 3 (three) days. 05/10/16   Orson Eva, MD  fluticasone (CUTIVATE) 0.05 % cream Apply 1 application topically 2 (two) times daily as needed (dry skin on face).     [provider]  glyBURIDE-metformin (GLUCOVANCE) 2.5-500 MG per tablet Take 2 tablets by mouth 2 (two) times daily with a meal.  03/05/12   [provider]  HYDROmorphone (DILAUDID) 2 MG tablet Take 1 tablet (2 mg total) by mouth at bedtime. Patient taking differently: Take 2 mg by mouth at bedtime as needed (for breakthrough pain).  05/10/16   Orson Eva, MD  insulin glargine (LANTUS) 100 UNIT/ML injection Inject 0.6 mLs (60 Units total) into the skin at bedtime. Patient taking differently: Inject 50 Units into the skin at bedtime.  05/11/16   Orson Eva, MD  Insulin Glulisine (APIDRA) 100 UNIT/ML Solostar Pen Inject 13 Units into the skin 3 (three) times daily with meals. Patient taking differently: Inject 15-18 Units into the skin 3 (three) times daily as needed (for CBG). Sliding scale 05/11/16   Tat, Shanon Brow, MD  isosorbide mononitrate (IMDUR) 120 MG 24 hr tablet Take 120 mg by mouth daily.    [provider]  Linaclotide Rolan Lipa) 145 MCG CAPS capsule Take 1 capsule (145 mcg total) by mouth daily as needed. Patient taking differently: Take 145 mcg by mouth daily as needed (constipation).  01/20/15   Florencia Reasons, MD  Multiple Vitamin (MULTIVITAMIN WITH MINERALS) TABS tablet Take 1 tablet by mouth daily.    [provider]  nitroGLYCERIN (NITRO-BID) 2 % ointment Apply 0.75 inches topically daily as needed for chest pain.     [provider]  nitroGLYCERIN (NITRODUR - DOSED IN MG/24 HR)  0.2 mg/hr patch Place 1 patch (0.2 mg total) onto the skin daily. 01/08/17   Suzan Slick, NP  nitroGLYCERIN (NITROSTAT) 0.4 MG SL tablet Place 1 tablet (0.4 mg total) under the tongue every 5 (five) minutes as needed (as needed for esophageal spasm). Patient taking differently: Place 0.4 mg under the tongue every 5 (five) minutes as needed for chest pain.  08/09/14   Pyrtle, Lajuan Lines, MD  omega-3  acid ethyl esters (LOVAZA) 1 G capsule Take 2 g by mouth 2 (two) times daily.    [provider]  ondansetron (ZOFRAN-ODT) 4 MG disintegrating tablet Take 4 mg by mouth 4 (four) times daily as needed for nausea.    [provider]  oxyCODONE-acetaminophen (PERCOCET/ROXICET) 5-325 MG tablet Take 1 tablet by mouth every 8 (eight) hours as needed. Patient taking differently: Take 1 tablet by mouth 2 (two) times daily as needed for moderate pain.  05/10/16   Orson Eva, MD  pantoprazole (PROTONIX) 40 MG tablet Take 1 tablet (40 mg total) by mouth daily before breakfast. 10/30/14   Adrian Prows, MD  pregabalin (LYRICA) 100 MG capsule Take 1 capsule (100 mg total) by mouth 3 (three) times daily. 05/10/16   Orson Eva, MD  Probiotic CAPS Take 2 capsules by mouth daily after breakfast.    [provider]  Propylene Glycol (SYSTANE BALANCE OP) Place 2-3 drops into both eyes daily as needed (for dry eyes).    [provider]  rOPINIRole (REQUIP) 2 MG tablet Take 2 mg by mouth at bedtime.     [provider]  Tamsulosin HCl (FLOMAX) 0.4 MG CAPS Take 0.4 mg by mouth daily.    [provider]  torsemide (DEMADEX) 20 MG tablet Take 20 mg by mouth every morning. MAY TAKE AN EXTRA 20 MG ONCE A DAY FOR EXCESSIVE FLUID RETENTION    [provider]     Family History  Problem Relation Age of Onset  . Heart disease Father        Before age 24-  CAD, BPG  . Diabetes Father        Amputation  . Cancer Father        PROSTATE  . Hyperlipidemia Father   . Hypertension  Father   . Heart attack Father        Triple BPG  . Varicose Veins Father   . Colon cancer Brother   . Cancer Brother        Colon  . Diabetes Brother   . Heart disease Brother 34       A-Fib. Before age 29  . Hyperlipidemia Brother   . Hypertension Brother   . Cancer Sister        Breast  . Hyperlipidemia Sister   . Hypertension Sister   . Hypertension Son   . Arthritis Unknown        GRANDMOTHER  . Hypertension Unknown        OTHER FAMILY MEMBERS    Social History   Socioeconomic History  . Marital status: Married    Spouse name: Not on file  . Number of children: Not on file  . Years of education: Not on file  . Highest education level: Not on file  Occupational History  . Occupation: Magazine features editor: UNEMPLOYED  Social Needs  . Financial resource strain: Not on file  . Food insecurity:    Worry: Not on file    Inability: Not on file  . Transportation needs:    Medical: Not on file    Non-medical: Not on file  Tobacco Use  . Smoking status: Current Every Day Smoker    Packs/day: 0.50    Years: 35.00    Pack years: 17.50    Types: E-cigarettes    Last attempt to quit: 10/28/2011    Years since quitting: 6.1  . Smokeless tobacco: Never Used  . Tobacco comment: VAPOR CIGARETTES (  Water only)  Substance and Sexual Activity  . Alcohol use: No    Alcohol/week: 0.0 oz    Comment: "not in a long time"  . Drug use: No  . Sexual activity: Yes    Birth control/protection: Implant  Lifestyle  . Physical activity:    Days per week: Not on file    Minutes per session: Not on file  . Stress: Not on file  Relationships  . Social connections:    Talks on phone: Not on file    Gets together: Not on file    Attends religious service: Not on file    Active member of club or organization: Not on file    Attends meetings of clubs or organizations: Not on file    Relationship status: Not on file  Other Topics Concern  . Not on file  Social History  Narrative   HEAVY SMOKER AND CONTINUES TO SMOKE 1 PPD. DOES NOT EXERCISE REGULARLY.    Wife [phone] Lebron Quam). Has 2 sons and daughter. Still active            Review of Systems  Vital Signs: There were no vitals taken for this visit.  Physical Exam Awake and alert NAD Drain in place lower abdomen Tan thick material in bag  Imaging: Ct Abdomen Pelvis Wo Contrast  Result Date: 11/19/2017 CLINICAL DATA:  Status post percutaneous catheter drainage sigmoid diverticular abscess 11/04/2017. EXAM: CT ABDOMEN AND PELVIS WITHOUT CONTRAST TECHNIQUE: Multidetector CT imaging of the abdomen and pelvis was performed following the standard protocol without IV contrast. COMPARISON:  10/31/2017 FINDINGS: Lower chest: No acute abnormality. Hepatobiliary: No focal liver abnormality is seen. Status post cholecystectomy. No biliary dilatation. Pancreas: Unremarkable. No pancreatic ductal dilatation or surrounding inflammatory changes. Spleen: Normal in size without focal abnormality. Adrenals/Urinary Tract: No adrenal masses. Stable small nonobstructing calculi in the lower pole collecting systems of both kidneys. The bladder is decompressed. Stomach/Bowel: No evidence of bowel obstruction or free air. Inflammation anterior to the mid sigmoid colon has improved. Percutaneous catheter present in the midline with collapse the diverticular abscess. A small amount of air remains around the distal portion of the catheter without significant residual fluid. Vascular/Lymphatic: No significant vascular findings are present. No enlarged abdominal or pelvic lymph nodes. Reproductive: Prostate is unremarkable.  Penile prosthesis present. Other: No abdominal wall hernia or abnormality. No abdominopelvic ascites. Musculoskeletal: Degenerative disc disease and status post posterior lumbar fusion at L4-5 and L5-S1. IMPRESSION: Resolution of fluid component of sigmoid diverticular abscess with small amount of air present  around the drainage catheter. This catheter will be injected with contrast under fluoroscopy to evaluate for fistula. Electronically Signed   By: Aletta Edouard M.D.   On: 11/19/2017 14:31   Dg Sinus/fist Tube Chk-non Gi  Result Date: 11/19/2017 INDICATION: Status post percutaneous catheter drainage of sigmoid diverticular abscess on 11/04/2017. Follow-up CT earlier today demonstrates resolution of fluid component of abscess. Contrast injection under fluoroscopy is performed to assess for fistula. EXAM: INJECTION ABSCESS DRAINAGE CATHETER UNDER FLUOROSCOPY CONTRAST:  20 mL Omnipaque 300 FLUOROSCOPY TIME:  18 seconds.  27.0 mGy. MEDICATIONS: None ANESTHESIA/SEDATION: None COMPLICATIONS: None immediate. PROCEDURE: The pre-existing percutaneous drainage catheter was injected with contrast material under fluoroscopy. A cine loop and fluoroscopic spot image were saved. The catheter was connected to a gravity drainage bag. FINDINGS: With injection of contrast, there is initial filling of a small cavity conforming to the pigtail portion of the drainage catheter. There then is immediate filling of  a fistula to the adjacent sigmoid colon with further opacification of the sigmoid colonic lumen. 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 The drainage catheter will be switched from suction bulb drainage to gravity bag drainage. Electronically Signed   By: Aletta Edouard M.D.   On: 11/19/2017 17:01   Ct Image Guided Drainage By Percutaneous Catheter  Result Date: 11/04/2017 INDICATION: 62 year old male with a history of lower abdominal abscess secondary to diverticulitis EXAM: CT-GUIDED DRAINAGE ABDOMINAL ABSCESS MEDICATIONS: The patient is currently admitted to the hospital and receiving intravenous antibiotics. The antibiotics were administered within an appropriate time frame prior to the initiation of the procedure. ANESTHESIA/SEDATION:  1.5 mg IV Versed 75 mcg IV Fentanyl Moderate Sedation Time:  13 minutes The patient was continuously monitored during the procedure by the interventional radiology nurse under my direct supervision. COMPLICATIONS: None TECHNIQUE: Informed written consent was obtained from the patient after a thorough discussion of the procedural risks, benefits and alternatives. All questions were addressed. Maximal Sterile Barrier Technique was utilized including caps, mask, sterile gowns, sterile gloves, sterile drape, hand hygiene and skin antiseptic. A timeout was performed prior to the initiation of the procedure. PROCEDURE: The operative field was prepped with Chlorhexidine in a sterile fashion, and a sterile drape was applied covering the operative field. A sterile gown and sterile gloves were used for the procedure. Local anesthesia was provided with 1% Lidocaine. Once the patient is prepped and draped in the usual sterile fashion, 1% lidocaine was used for local anesthesia. Using modified Seldinger technique, a 10 French drain was placed in the abscess cavity within the lower abdomen. Proximally 25 cc of purulent in slightly bloody fluid aspirated. Sample was sent for culture. Catheter was sutured in position and attached to bulb suction. Patient tolerated the procedure well and remained hemodynamically stable throughout. No complications were encountered and no significant blood loss. FINDINGS: Scout CT demonstrates abscess within the lower abdomen, just superior to the urinary bladder and associated with sigmoid colon and the sigmoid colon inflammatory changes. Status post drainage there is a 10 French catheter within the abscess cavity which is collapsed. IMPRESSION: Status post CT-guided drainage of abdominal abscess. Signed, Dulcy Fanny. Earleen Newport, DO [facility] Radiology Electronically Signed   By: Corrie Mckusick D.O.   On: 11/04/2017 15:09   Ir Radiologist Eval &  Mgmt  Result Date: 11/19/2017 Please refer to notes tab for details about interventional procedure. (Op Note)   Labs:  CBC: Recent Labs    11/04/17 0541 11/05/17 0707 11/07/17 0706 11/08/17 0626  WBC 6.6 6.9 6.7 6.5  HGB 10.6* 8.9* 8.3* 8.6*  HCT 31.3* 26.1* 24.5* 25.6*  PLT 265 202 171 189    COAGS: Recent Labs    11/04/17 0910  INR 1.05    BMP: Recent Labs    11/07/17 0706 11/08/17 0626 11/09/17 0321 11/10/17 0609  NA 139 136 139 138  K 3.6 3.5 3.8 3.7  CL 105 102 103 103  CO2 25 24 25 25   GLUCOSE 221* 242* 142* 110*  BUN 10 9 10 14   CALCIUM 8.0* 8.0* 8.7* 8.5*  CREATININE 1.73* 1.68* 1.44* 1.51*  GFRNONAA 40* 42* 50* 47*  GFRAA 47* 48* 58* 55*    LIVER FUNCTION TESTS: Recent Labs    11/04/17 0541 11/05/17 0707  BILITOT 0.5 0.5  AST 26 13*  ALT 13* 11*  ALKPHOS 106 86  PROT 6.1* 4.9*  ALBUMIN  3.0* 2.4*    TUMOR MARKERS: No results for input(s): AFPTM, CEA, CA199, CHROMGRNA in the last 8760 hours.  Assessment:  Lower abdominal abscess secondary to diverticulitis.  S/P image guided drain placement by Dr. Earleen Newport on 11/04/2017.  Drain injection done today reveals continued fistulous connection.  Will leave drain in place another 2 weeks. He will return in 2 weeks for injection.  Continue gravity bag and record output. No flushing.  Electronically Signed: Murrell Redden PA-C 12/03/2017, 11:17 AM   Please refer to Dr. Fritz Pickerel attestation of this note for management and plan.

## 2017-12-05 DIAGNOSIS — I739 Peripheral vascular disease, unspecified: Secondary | ICD-10-CM | POA: Diagnosis not present

## 2017-12-05 DIAGNOSIS — I25119 Atherosclerotic heart disease of native coronary artery with unspecified angina pectoris: Secondary | ICD-10-CM | POA: Diagnosis not present

## 2017-12-05 DIAGNOSIS — R600 Localized edema: Secondary | ICD-10-CM | POA: Diagnosis not present

## 2017-12-10 DIAGNOSIS — K5720 Diverticulitis of large intestine with perforation and abscess without bleeding: Secondary | ICD-10-CM | POA: Diagnosis not present

## 2017-12-10 DIAGNOSIS — Z4682 Encounter for fitting and adjustment of non-vascular catheter: Secondary | ICD-10-CM | POA: Diagnosis not present

## 2017-12-10 DIAGNOSIS — E1122 Type 2 diabetes mellitus with diabetic chronic kidney disease: Secondary | ICD-10-CM | POA: Diagnosis not present

## 2017-12-17 ENCOUNTER — Other Ambulatory Visit: Payer: 59

## 2017-12-17 DIAGNOSIS — K651 Peritoneal abscess: Secondary | ICD-10-CM | POA: Diagnosis not present

## 2017-12-17 DIAGNOSIS — G894 Chronic pain syndrome: Secondary | ICD-10-CM | POA: Diagnosis not present

## 2017-12-17 DIAGNOSIS — E1140 Type 2 diabetes mellitus with diabetic neuropathy, unspecified: Secondary | ICD-10-CM | POA: Diagnosis not present

## 2017-12-18 ENCOUNTER — Ambulatory Visit
Admission: RE | Admit: 2017-12-18 | Discharge: 2017-12-18 | Disposition: A | Discharge status: Home / Self Care | Payer: 59 | Source: Ambulatory Visit | Attending: Surgery | Admitting: Surgery

## 2017-12-18 ENCOUNTER — Encounter: Payer: Self-pay | Admitting: Radiology

## 2017-12-18 DIAGNOSIS — K651 Peritoneal abscess: Secondary | ICD-10-CM

## 2017-12-18 DIAGNOSIS — K632 Fistula of intestine: Secondary | ICD-10-CM | POA: Diagnosis not present

## 2017-12-18 HISTORY — PX: IR RADIOLOGIST EVAL & MGMT: IMG5224

## 2017-12-18 NOTE — Progress Notes (Signed)
Patient ID: [name], male   DOB: 05-03-1956, 61 y.o.   MRN: [id number]       Chief Complaint: Diverticular abscess, outpatient follow-up  Referring Physician(s): Celie Desrochers  History of Present Illness: [name] is a 62 y.o. male patient returns for outpatient follow-up for his pelvic diverticular abscess drain.  Drain catheter placed originally to 11/04/2017.  Follow-up CT imaging demonstrated resolution of the abscess.  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  He has discontinued flushing the drain.  Drain catheter remains to gravity bag.  Minimal output over the last 2 weeks.  No interval fevers.  He has completed antibiotics.  He is followed by Dr. Windle Guard.  Past Medical History:  Diagnosis Date  . Barrett's esophagus   . Carotid artery occlusion 11/10/10   LEFT CAROTID ENDARTERECTOMY  . CHF (congestive heart failure) ([place])   . Complication of anesthesia    BP WENT UP AT [facility] "  . COPD (chronic obstructive pulmonary disease) ([place])   . Coronary artery disease   . Diabetes mellitus   . Diverticulitis   . Diverticulosis of colon (without mention of hemorrhage)   . DJD (degenerative joint disease)   . Fatty liver   . Full dentures   . GERD (gastroesophageal reflux disease)   . H/O Doppler ultrasound 06/28/2012   Lower venous duplex left-- findings consistent with acute deep vein thrombosis involving the personal vein of the left lower extremity. No evidence of Baker's cyst on the left. Duplex scan of the left lower extremity arteries reveal adequate blood flow.   . H/O hiatal hernia   . History of cardiovascular stress test 03/23/2011   ejection fraction measured 51%, with an end-diastolic volume of 656 ml and an end-systolic volume of 81 ml. no evidence of myocardial ischemia.  Marland Kitchen History of echocardiogram 08/22/2011   Mildly hypertrophic left ventricle with normal systolic function and mild diastolic  dysfunction. mildly dilated left atrium. no significant valvular abnormalities. no pericardial effusion no change from previous study 10/2010. the ejection fraction =>55%  . History of Holter monitoring 02/14/2012   Scan quality is fair due to baseline artifact. Predominant rhythm is Sinus. Veentricular Ectopy was noted in single beat form. The total recording time was 24:00 hours. total beats =[id number]. the total time analyzed was 23:58 hours. Heart  Max = 102 at 1:18:22, Min= 52 at 9:56:19 am., Avg= 70. There were 0 pause(s) > 2.5 seconds. the longest pause = 0.00 sec. at 1:51 pm(1)  . Hyperlipidemia   . Hypertension   . Neuromuscular disorder ([place])    peripheral neuropathy  . OSA (obstructive sleep apnea) 12/26/2008   took himself off CPAP; AHI of 6.3/hr overall, 32.7/hr during REM sleep and an RDI of 24.8/hr and 50.9/hr.  . Osteomyelitis ([place])    left 5th metatarsal  . PAD (peripheral artery disease) ([place])    Distal aortogram June 2012. Atherectomy left popliteal artery July 2012.   Marland Kitchen Slurred speech    AS PER WIFE IN D/C NOTE 11/10/10  . Substernal chest pain   . Visual color changes    LEFT EYE; BUT NOT TOTAL BLINDNESS  . Wears glasses     Past Surgical History:  Procedure Laterality Date  . AMPUTATION  11/05/2011   Procedure: AMPUTATION RAY;  Surgeon: Wylene Simmer, MD;  Location: [place];  Service: Orthopedics;  Laterality: Right;  Amputation of Right 4&5th Toes  . AMPUTATION Left 11/26/2012  Procedure: AMPUTATION RAY;  Surgeon: Wylene Simmer, MD;  Location: [place];  Service: Orthopedics;  Laterality: Left;  fourth ray amputation  . AMPUTATION Right 08/27/2014   Procedure: Transmetatarsal Amputation;  Surgeon: Newt Minion, MD;  Location: [place];  Service: Orthopedics;  Laterality: Right;  . AMPUTATION Right 01/14/2015   Procedure: AMPUTATION BELOW KNEE;  Surgeon: Newt Minion, MD;  Location: [place];  Service: Orthopedics;  Laterality: Right;  . AMPUTATION Left 10/21/2015   Procedure: Left Foot  5th Ray Amputation;  Surgeon: Newt Minion, MD;  Location: [place];  Service: Orthopedics;  Laterality: Left;  . ANTERIOR FUSION CERVICAL SPINE  02/06/06   C4-5, C5-6, C6-7; SURGEON DR. MAX COHEN  . CARDIAC CATHETERIZATION  10/31/04   2009  . CAROTID ENDARTERECTOMY  11/10/10  . CAROTID ENDARTERECTOMY Left 11/10/2010   Subtotal occlusion of left internal carotid artery with left hemispheric transient ischemic attacks.  . CARPAL TUNNEL RELEASE Right 10/21/2013   Procedure: RIGHT CARPAL TUNNEL RELEASE;  Surgeon: Wynonia Sours, MD;  Location: [place];  Service: Orthopedics;  Laterality: Right;  . CHOLECYSTECTOMY    . COLONOSCOPY    . ESOPHAGEAL MANOMETRY Bilateral 07/19/2014   Procedure: ESOPHAGEAL MANOMETRY (EM);  Surgeon: Jerene Bears, MD;  Location: [facility];  Service: Gastroenterology;  Laterality: Bilateral;  . FEMORAL ARTERY STENT     x6  . FOOT SURGERY  04/25/2016    EXCISION BASE 5TH METATARSAL AND PARTIAL CUBOID LEFT FOOT  . HERNIA REPAIR     LEFT INGUINAL AND UMBILICAL REPAIRS  . I&D EXTREMITY Left 04/25/2016   Procedure: EXCISION BASE 5TH METATARSAL AND PARTIAL CUBOID LEFT FOOT;  Surgeon: Newt Minion, MD;  Location: [place];  Service: Orthopedics;  Laterality: Left;  . IR RADIOLOGIST EVAL & MGMT  11/19/2017  . IR RADIOLOGIST EVAL & MGMT  12/03/2017  . IR RADIOLOGIST EVAL & MGMT  12/18/2017  . JOINT REPLACEMENT Right 2001   Total  . LAMINECTOMY     X 3 LUMBAR AND X 2 CERVICAL SPINE OPERATIONS  . LAPAROSCOPIC CHOLECYSTECTOMY W/ CHOLANGIOGRAPHY  11/09/04   SURGEON DR. Luella Cook  . LEFT HEART CATHETERIZATION WITH CORONARY ANGIOGRAM N/A 10/29/2014   Procedure: LEFT HEART CATHETERIZATION WITH CORONARY ANGIOGRAM;  Surgeon: Laverda Page, MD;  Location: [facility] CATH LAB;  Service: Cardiovascular;  Laterality: N/A;  . LOWER EXTREMITY ANGIOGRAM N/A 03/19/2012   Procedure: LOWER EXTREMITY ANGIOGRAM;  Surgeon: Burnell Blanks, MD;  Location: [facility] CATH LAB;  Service:  Cardiovascular;  Laterality: N/A;  . NECK SURGERY    . PENILE PROSTHESIS IMPLANT  08/14/05   INFRAPUBIC INSERTION OF INFLATABLE PENILE PROSTHESIS; SURGEON DR. Amalia Hailey  . PERCUTANEOUS CORONARY STENT INTERVENTION (PCI-S) Right 10/29/2014   Procedure: PERCUTANEOUS CORONARY STENT INTERVENTION (PCI-S);  Surgeon: Laverda Page, MD;  Location: [facility] CATH LAB;  Service: Cardiovascular;  Laterality: Right;  . SHOULDER ARTHROSCOPY    . SPINE SURGERY    . TONSILLECTOMY    . TOTAL KNEE ARTHROPLASTY  07/2002   RIGHT KNEE ; SURGEON  DR. [name] ALSO HAD ARTHROSCOPIC RIGHT KNEE IN  10/2001  . ULNAR NERVE TRANSPOSITION Right 10/21/2013   Procedure: RIGHT ELBOW  ULNAR NERVE DECOMPRESSION;  Surgeon: Wynonia Sours, MD;  Location: [place];  Service: Orthopedics;  Laterality: Right;    Allergies: Ivp dye [iodinated diagnostic agents] and Adhesive [tape]  Medications: Prior to Admission medications   Medication Sig Start Date End Date Taking? Authorizing Provider  amLODipine-olmesartan (AZOR) 5-20 MG tablet Take 2 tablets by mouth every evening. 08/01/16   Adrian Prows, MD  amoxicillin-clavulanate (AUGMENTIN) 875-125 MG tablet Take 1 tablet by mouth every 12 (twelve) hours. 11/10/17   Ghimire, Henreitta Leber, MD  aspirin EC 81 MG tablet Take 81 mg by mouth daily.    [provider]  atorvastatin (LIPITOR) 40 MG tablet Take 40 mg by mouth daily.    [provider]  carvedilol (COREG) 25 MG tablet Take 1.5 tablets (37.5 mg total) by mouth 2 (two) times daily. 08/01/16   Adrian Prows, MD  clobetasol (TEMOVATE) 0.05 % external solution Apply 1 application topically daily as needed (dry scalp).    [provider]  clopidogrel (PLAVIX) 75 MG tablet Take 75 mg by mouth daily.  03/28/12   [provider]  clotrimazole-betamethasone (LOTRISONE) cream Apply 1 application topically daily as needed (dry skin - beard).     [provider]  collagenase (SANTYL) ointment Apply  topically daily. Patient taking differently: Apply 1 application topically daily. TO AFFECTED SITE(S) 09/06/15   Lavina Hamman, MD  fentaNYL (DURAGESIC - DOSED MCG/HR) 50 MCG/HR Place 1 patch (50 mcg total) onto the skin every 3 (three) days. 05/10/16   Orson Eva, MD  fluticasone (CUTIVATE) 0.05 % cream Apply 1 application topically 2 (two) times daily as needed (dry skin on face).     [provider]  glyBURIDE-metformin (GLUCOVANCE) 2.5-500 MG per tablet Take 2 tablets by mouth 2 (two) times daily with a meal.  03/05/12   [provider]  HYDROmorphone (DILAUDID) 2 MG tablet Take 1 tablet (2 mg total) by mouth at bedtime. Patient taking differently: Take 2 mg by mouth at bedtime as needed (for breakthrough pain).  05/10/16   Orson Eva, MD  insulin glargine (LANTUS) 100 UNIT/ML injection Inject 0.6 mLs (60 Units total) into the skin at bedtime. Patient taking differently: Inject 50 Units into the skin at bedtime.  05/11/16   Orson Eva, MD  Insulin Glulisine (APIDRA) 100 UNIT/ML Solostar Pen Inject 13 Units into the skin 3 (three) times daily with meals. Patient taking differently: Inject 15-18 Units into the skin 3 (three) times daily as needed (for CBG). Sliding scale 05/11/16   Tat, Shanon Brow, MD  isosorbide mononitrate (IMDUR) 120 MG 24 hr tablet Take 120 mg by mouth daily.    [provider]  Linaclotide Rolan Lipa) 145 MCG CAPS capsule Take 1 capsule (145 mcg total) by mouth daily as needed. Patient taking differently: Take 145 mcg by mouth daily as needed (constipation).  01/20/15   Florencia Reasons, MD  Multiple Vitamin (MULTIVITAMIN WITH MINERALS) TABS tablet Take 1 tablet by mouth daily.    [provider]  nitroGLYCERIN (NITRO-BID) 2 % ointment Apply 0.75 inches topically daily as needed for chest pain.     [provider]  nitroGLYCERIN (NITRODUR - DOSED IN MG/24 HR) 0.2 mg/hr patch Place 1 patch (0.2 mg total) onto the skin daily. 01/08/17   Suzan Slick, NP    nitroGLYCERIN (NITROSTAT) 0.4 MG SL tablet Place 1 tablet (0.4 mg total) under the tongue every 5 (five) minutes as needed (as needed for esophageal spasm). Patient taking differently: Place 0.4 mg under the tongue every 5 (five) minutes as needed for chest pain.  08/09/14   Pyrtle, Lajuan Lines, MD  omega-3 acid ethyl esters (LOVAZA) 1 G capsule Take 2 g by mouth 2 (two) times daily.    [provider]  ondansetron (ZOFRAN-ODT) 4  MG disintegrating tablet Take 4 mg by mouth 4 (four) times daily as needed for nausea.    [provider]  oxyCODONE-acetaminophen (PERCOCET/ROXICET) 5-325 MG tablet Take 1 tablet by mouth every 8 (eight) hours as needed. Patient taking differently: Take 1 tablet by mouth 2 (two) times daily as needed for moderate pain.  05/10/16   Orson Eva, MD  pantoprazole (PROTONIX) 40 MG tablet Take 1 tablet (40 mg total) by mouth daily before breakfast. 10/30/14   Adrian Prows, MD  pregabalin (LYRICA) 100 MG capsule Take 1 capsule (100 mg total) by mouth 3 (three) times daily. 05/10/16   Orson Eva, MD  Probiotic CAPS Take 2 capsules by mouth daily after breakfast.    [provider]  Propylene Glycol (SYSTANE BALANCE OP) Place 2-3 drops into both eyes daily as needed (for dry eyes).    [provider]  rOPINIRole (REQUIP) 2 MG tablet Take 2 mg by mouth at bedtime.     [provider]  Tamsulosin HCl (FLOMAX) 0.4 MG CAPS Take 0.4 mg by mouth daily.    [provider]  torsemide (DEMADEX) 20 MG tablet Take 20 mg by mouth every morning. MAY TAKE AN EXTRA 20 MG ONCE A DAY FOR EXCESSIVE FLUID RETENTION    [provider]     Family History  Problem Relation Age of Onset  . Heart disease Father        Before age 5-  CAD, BPG  . Diabetes Father        Amputation  . Cancer Father        PROSTATE  . Hyperlipidemia Father   . Hypertension Father   . Heart attack Father        Triple BPG  . Varicose Veins Father   . Colon cancer  Brother   . Cancer Brother        Colon  . Diabetes Brother   . Heart disease Brother 39       A-Fib. Before age 29  . Hyperlipidemia Brother   . Hypertension Brother   . Cancer Sister        Breast  . Hyperlipidemia Sister   . Hypertension Sister   . Hypertension Son   . Arthritis Unknown        GRANDMOTHER  . Hypertension Unknown        OTHER FAMILY MEMBERS    Social History   Socioeconomic History  . Marital status: Married    Spouse name: Not on file  . Number of children: Not on file  . Years of education: Not on file  . Highest education level: Not on file  Occupational History  . Occupation: Magazine features editor: UNEMPLOYED  Social Needs  . Financial resource strain: Not on file  . Food insecurity:    Worry: Not on file    Inability: Not on file  . Transportation needs:    Medical: Not on file    Non-medical: Not on file  Tobacco Use  . Smoking status: Current Every Day Smoker    Packs/day: 0.50    Years: 35.00    Pack years: 17.50    Types: E-cigarettes    Last attempt to quit: 10/28/2011    Years since quitting: 6.1  . Smokeless tobacco: Never Used  . Tobacco comment: VAPOR CIGARETTES (Water only)  Substance and Sexual Activity  . Alcohol use: No    Alcohol/week: 0.0 oz    Comment: "not in a long  time"  . Drug use: No  . Sexual activity: Yes    Birth control/protection: Implant  Lifestyle  . Physical activity:    Days per week: Not on file    Minutes per session: Not on file  . Stress: Not on file  Relationships  . Social connections:    Talks on phone: Not on file    Gets together: Not on file    Attends religious service: Not on file    Active member of club or organization: Not on file    Attends meetings of clubs or organizations: Not on file    Relationship status: Not on file  Other Topics Concern  . Not on file  Social History Narrative   HEAVY SMOKER AND CONTINUES TO SMOKE 1 PPD. DOES NOT EXERCISE REGULARLY.    Wife  [phone] Lebron Quam). Has 2 sons and daughter. Still active              Review of Systems: A 12 point ROS discussed and pertinent positives are indicated in the HPI above.  All other systems are negative.  Review of Systems  Vital Signs: BP (!) 147/73   Pulse 61   Temp 98.8 F (37.1 C)   SpO2 100%   Physical Exam  Constitutional: He appears well-developed and well-nourished. No distress.  Eyes: Conjunctivae are normal. No scleral icterus.  Abdominal: Soft. Bowel sounds are normal.  Drain catheter site is mildly indurated, red and tender secondary to irritation in the dressing.  No purulent drainage.  Skin: He is not diaphoretic.     Imaging: Ct Abdomen Pelvis Wo Contrast  Result Date: 11/19/2017 CLINICAL DATA:  Status post percutaneous catheter drainage sigmoid diverticular abscess 11/04/2017. EXAM: CT ABDOMEN AND PELVIS WITHOUT CONTRAST TECHNIQUE: Multidetector CT imaging of the abdomen and pelvis was performed following the standard protocol without IV contrast. COMPARISON:  10/31/2017 FINDINGS: Lower chest: No acute abnormality. Hepatobiliary: No focal liver abnormality is seen. Status post cholecystectomy. No biliary dilatation. Pancreas: Unremarkable. No pancreatic ductal dilatation or surrounding inflammatory changes. Spleen: Normal in size without focal abnormality. Adrenals/Urinary Tract: No adrenal masses. Stable small nonobstructing calculi in the lower pole collecting systems of both kidneys. The bladder is decompressed. Stomach/Bowel: No evidence of bowel obstruction or free air. Inflammation anterior to the mid sigmoid colon has improved. Percutaneous catheter present in the midline with collapse the diverticular abscess. A small amount of air remains around the distal portion of the catheter without significant residual fluid. Vascular/Lymphatic: No significant vascular findings are present. No enlarged abdominal or pelvic lymph nodes. Reproductive: Prostate is  unremarkable.  Penile prosthesis present. Other: No abdominal wall hernia or abnormality. No abdominopelvic ascites. Musculoskeletal: Degenerative disc disease and status post posterior lumbar fusion at L4-5 and L5-S1. IMPRESSION: Resolution of fluid component of sigmoid diverticular abscess with small amount of air present around the drainage catheter. This catheter will be injected with contrast under fluoroscopy to evaluate for fistula. Electronically Signed   By: Aletta Edouard M.D.   On: 11/19/2017 14:31   Dg Sinus/fist Tube Chk-non Gi  Result Date: 12/18/2017 INDICATION: Diverticular abscess, status post percutaneous drain EXAM: FLUOROSCOPIC INJECTION OF THE ABSCESS DRAIN MEDICATIONS: NONE. ANESTHESIA/SEDATION: None. COMPLICATIONS: None. PROCEDURE: Pelvic abscess drain catheter was injected under fluoroscopy. Imaging performed. The drain catheter is stable in position. A small collapsed abscess cavity remains with a patent fistula to the sigmoid colon. This has a similar appearance compared to 12/03/2017. IMPRESSION: Positive exam for small persistent fistula from the collapsed  abscess cavity to the adjacent sigmoid colon. No interval change. Electronically Signed   By: Jerilynn Mages.  Conya Ellinwood M.D.   On: 12/18/2017 14:17   Dg Sinus/fist Tube Chk-non Gi  Result Date: 12/03/2017 INDICATION: Diverticular abscess, status post percutaneous drain EXAM: FLUOROSCOPIC INJECTION OF THE DIVERTICULAR ABSCESS DRAIN MEDICATIONS: PATIENT IS AN OUTPATIENT ON ORAL ANTIBIOTICS ANESTHESIA/SEDATION: None. COMPLICATIONS: None immediate. PROCEDURE: Under fluoroscopy, the existing abscess drain in the pelvis was injected with contrast. Fluoroscopic imaging performed. This confirms a residual small patent fistula to the sigmoid colon. Abscess cavity is collapsed. Imaging is similar to 11/19/2017. IMPRESSION: Positive exam for persistent fistula from the collapsed abscess cavity to the sigmoid colon. Electronically Signed   By: Jerilynn Mages.  Bertha Earwood  M.D.   On: 12/03/2017 15:27   Dg Sinus/fist Tube Chk-non Gi  Result Date: 11/19/2017 INDICATION: Status post percutaneous catheter drainage of sigmoid diverticular abscess on 11/04/2017. Follow-up CT earlier today demonstrates resolution of fluid component of abscess. Contrast injection under fluoroscopy is performed to assess for fistula. EXAM: INJECTION ABSCESS DRAINAGE CATHETER UNDER FLUOROSCOPY CONTRAST:  20 mL Omnipaque 300 FLUOROSCOPY TIME:  18 seconds.  27.0 mGy. MEDICATIONS: None ANESTHESIA/SEDATION: None COMPLICATIONS: None immediate. PROCEDURE: The pre-existing percutaneous drainage catheter was injected with contrast material under fluoroscopy. A cine loop and fluoroscopic spot image were saved. The catheter was connected to a gravity drainage bag. FINDINGS: With injection of contrast, there is initial filling of a small cavity conforming to the pigtail portion of the drainage catheter. There then is immediate filling of a fistula to the adjacent sigmoid colon with further opacification of the sigmoid colonic lumen. 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 The drainage catheter will be switched from suction bulb drainage to gravity bag drainage. Electronically Signed   By: Aletta Edouard M.D.   On: 11/19/2017 17:01   Ir Radiologist Eval & Mgmt  Result Date: 12/18/2017 Please refer to notes tab for details about interventional procedure. (Op Note)  Ir Radiologist Eval & Mgmt  Result Date: 12/03/2017 Please refer to notes tab for details about interventional procedure. (Op Note)  Ir Radiologist Eval & Mgmt  Result Date: 11/19/2017 Please refer to notes tab for details about interventional procedure. (Op Note)   Labs:  CBC: Recent Labs    11/04/17 0541 11/05/17 0707 11/07/17 0706 11/08/17 0626  WBC 6.6 6.9 6.7 6.5  HGB 10.6* 8.9* 8.3* 8.6*  HCT 31.3* 26.1* 24.5* 25.6*  PLT 265 202 171 189     COAGS: Recent Labs    11/04/17 0910  INR 1.05    BMP: Recent Labs    11/07/17 0706 11/08/17 0626 11/09/17 0321 11/10/17 0609  NA 139 136 139 138  K 3.6 3.5 3.8 3.7  CL 105 102 103 103  CO2 25 24 25 25   GLUCOSE 221* 242* 142* 110*  BUN 10 9 10 14   CALCIUM 8.0* 8.0* 8.7* 8.5*  CREATININE 1.73* 1.68* 1.44* 1.51*  GFRNONAA 40* 42* 50* 47*  GFRAA 47* 48* 58* 55*    LIVER FUNCTION TESTS: Recent Labs    11/04/17 0541 11/05/17 0707  BILITOT 0.5 0.5  AST 26 13*  ALT 13* 11*  ALKPHOS 106 86  PROT 6.1* 4.9*  ALBUMIN 3.0* 2.4*    TUMOR MARKERS: No results for input(s): AFPTM, CEA, CA199, CHROMGRNA in the last 8760 hours.  Assessment and Plan:  Stable pelvic abscess drain catheter.  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  Plan: Drain catheter  will remain in place and connected to gravity drainage.  Recommend outpatient surgical follow-up as soon as possible to consider surgical intervention.    Electronically Signed: Greggory Keen 12/18/2017, 3:08 PM   I spent a total of    15 Minutes in face to face in clinical consultation, greater than 50% of which was counseling/coordinating care for with a pelvic diverticular abscess.

## 2017-12-19 DIAGNOSIS — E1122 Type 2 diabetes mellitus with diabetic chronic kidney disease: Secondary | ICD-10-CM | POA: Diagnosis not present

## 2017-12-19 DIAGNOSIS — Z4682 Encounter for fitting and adjustment of non-vascular catheter: Secondary | ICD-10-CM | POA: Diagnosis not present

## 2017-12-19 DIAGNOSIS — K5720 Diverticulitis of large intestine with perforation and abscess without bleeding: Secondary | ICD-10-CM | POA: Diagnosis not present

## 2017-12-24 DIAGNOSIS — K5720 Diverticulitis of large intestine with perforation and abscess without bleeding: Secondary | ICD-10-CM | POA: Diagnosis not present

## 2017-12-24 DIAGNOSIS — Z4682 Encounter for fitting and adjustment of non-vascular catheter: Secondary | ICD-10-CM | POA: Diagnosis not present

## 2017-12-24 DIAGNOSIS — E1122 Type 2 diabetes mellitus with diabetic chronic kidney disease: Secondary | ICD-10-CM | POA: Diagnosis not present

## 2017-12-24 DIAGNOSIS — I2510 Atherosclerotic heart disease of native coronary artery without angina pectoris: Secondary | ICD-10-CM | POA: Diagnosis not present

## 2017-12-24 DIAGNOSIS — E118 Type 2 diabetes mellitus with unspecified complications: Secondary | ICD-10-CM | POA: Diagnosis not present

## 2017-12-24 DIAGNOSIS — E1140 Type 2 diabetes mellitus with diabetic neuropathy, unspecified: Secondary | ICD-10-CM | POA: Diagnosis not present

## 2017-12-26 ENCOUNTER — Ambulatory Visit: Payer: Self-pay | Admitting: Surgery

## 2017-12-26 ENCOUNTER — Other Ambulatory Visit: Payer: Self-pay | Admitting: Urology

## 2017-12-26 DIAGNOSIS — K5792 Diverticulitis of intestine, part unspecified, without perforation or abscess without bleeding: Secondary | ICD-10-CM | POA: Diagnosis not present

## 2017-12-26 NOTE — H&P (View-Only) (Signed)
[name] Documented: 12/26/2017 9:00 AM Location: [facility] Patient #: [id number] DOB: [birth year] Married / Language: [name] / Race: White Male  History of Present Illness ([name]; 12/26/2017 12:29 PM) Patient words: He is here today to discuss surgery. He had a diverticular abscess that was drained at the end of February. The fistula has failed to heal. He is having feculent/purulent drainage into the bag as well as some leakage of stool and gas around the drain.  The patient is a 62 year old male.   Past Surgical History Mammie Lorenzo, LPN; 0/45/4098 1:19 AM) Carotid Artery Surgery Left. Foot Surgery Bilateral. Knee Surgery Right. Oral Surgery Shoulder Surgery Right. Spinal Surgery - Lower Back Spinal Surgery - Neck  Diagnostic Studies History Mammie Lorenzo, LPN; 1/47/8295 6:21 AM) Colonoscopy 1-5 years ago  Medication History Mammie Lorenzo, LPN; 11/15/6576 46:96 AM) Atorvastatin Calcium (40MG Tablet, Oral) Active. Lyrica (100MG Capsule, Oral) Active. Oxycodone-Acetaminophen (5-325MG Tablet, Oral) Active. FentaNYL (50MCG/HR Patch 72HR, Transdermal) Active. Clopidogrel Bisulfate (75MG Tablet, Oral) Active. Carvedilol (25MG Tablet, Oral) Active. Isosorbide Mononitrate ER (120MG Tablet ER 24HR, Oral) Active. Linzess (145MCG Capsule, Oral) Active. Lisinopril (10MG Tablet, Oral) Active. MetFORMIN HCl (1000MG Tablet, Oral) Active. Nitroglycerin (0.2MG/HR Patch 24HR, Transdermal) Active. OneTouch Ultra 2 (w/Device Kit,) Active. ROPINIRole HCl (2MG Tablet, Oral) Active. Tamsulosin HCl (0.4MG Capsule, Oral) Active. Torsemide (20MG Tablet, Oral) Active. Amlodipine-Olmesartan (5-20MG Tablet, Oral) Active. Probiotic (Oral) Active. Medications Reconciled  Social History Mammie Lorenzo, LPN; 2/95/2841 3:24 AM) Alcohol use Remotely quit alcohol use. Caffeine use Carbonated beverages, Coffee, Tea. No drug use Tobacco  use Current every day smoker.  Family History Mammie Lorenzo, LPN; 12/10/270 5:36 AM) Alcohol Abuse Family Members In General. Arthritis Brother, Family Members In General, Sister. Breast Cancer Sister. Colon Cancer Brother. Colon Polyps Brother. Diabetes Mellitus Brother, Family Members In [place], Father. Heart Disease Brother, Father. Hypertension Brother, Family Members In [place], Father, Sister, Son.  Other Problems Mammie Lorenzo, LPN; 6/44/0347 4:25 AM) Arthritis Back Pain Chest pain Congestive Heart Failure Diabetes Mellitus Enlarged Prostate Gastroesophageal Reflux Disease High blood pressure Hypercholesterolemia Inguinal Hernia Pulmonary Embolism / Blood Clot in Legs     Review of Systems Harold Billings Dockery LPN; 9/56/3875 6:43 AM) General Present- Appetite Loss, Chills and Fatigue. Not Present- Fever, Night Sweats, Weight Gain and Weight Loss. Skin Present- Dryness. Not Present- Change in Wart/Mole, Hives, Jaundice, New Lesions, Non-Healing Wounds, Rash and Ulcer. HEENT Present- Wears glasses/contact lenses. Not Present- Earache, Hearing Loss, Hoarseness, Nose Bleed, Oral Ulcers, Ringing in the Ears, Seasonal Allergies, Sinus Pain, Sore Throat, Visual Disturbances and Yellow Eyes. Respiratory Not Present- Bloody sputum, Chronic Cough, Difficulty Breathing, Snoring and Wheezing. Breast Not Present- Breast Mass, Breast Pain, Nipple Discharge and Skin Changes. Cardiovascular Present- Chest Pain and Swelling of Extremities. Not Present- Difficulty Breathing Lying Down, Leg Cramps, Palpitations, Rapid Heart Rate and Shortness of Breath. Gastrointestinal Present- Abdominal Pain, Chronic diarrhea and Nausea. Not Present- Bloating, Bloody Stool, Change in Bowel Habits, Constipation, Difficulty Swallowing, Excessive gas, Gets full quickly at meals, Hemorrhoids, Indigestion, Rectal Pain and Vomiting. Male Genitourinary Present- Frequency. Not Present- Blood in  Urine, Change in Urinary Stream, Impotence, Nocturia, Painful Urination, Urgency and Urine Leakage. Musculoskeletal Present- Back Pain, Joint Pain and Swelling of Extremities. Not Present- Joint Stiffness, Muscle Pain and Muscle Weakness. Neurological Present- Numbness, Tingling and Weakness. Not Present- Decreased Memory, Fainting, Headaches, Seizures, Tremor and Trouble walking. Psychiatric Not Present- Anxiety, Bipolar, Change in Sleep Pattern, Depression, Fearful and Frequent crying. Endocrine Present-  Cold Intolerance. Not Present- Excessive Hunger, Hair Changes, Heat Intolerance, Hot flashes and New Diabetes. Hematology Not Present- Blood Thinners, Easy Bruising, Excessive bleeding, Gland problems, HIV and Persistent Infections.  Vitals Harold Billings Dockery LPN; 02/27/3558 74:16 AM) 12/26/2017 11:56 AM Weight: 251 lb Height: 74in Body Surface Area: 2.39 m Body Mass Index: 32.23 kg/m  Temp.: 97.7F(Temporal)  Pulse: 88 (Regular)  BP: 158/72 (Sitting, Left Arm, Standard)      Physical Exam ([name]; 12/26/2017 12:30 PM)  The physical exam findings are as follows: Note:He is alert and cooperative Extraocular motions intact, no scleral icterus moist mucus membranes, Neck without mass or thyromegaly Unlabored respirations Regular rate and rhythm Abdomen is soft, it remains tender in the lower fields. In the low midline there is a drain exit site with feculent drainage around the tube. The bag connected to the drain contains liquid feculent purulent fluid approximately 20 cc, he states he last emptied A yesterday around lunchtime.    Assessment & Plan ([name]; 12/26/2017 12:32 PM)  DIVERTICULITIS (L84.53) Story: Unfortunately the fistula has failed to heal. Awaiting cardiac clearance but he is tentatively scheduled for sigmoid colectomy in mid May. I had a long discussion with him today about the surgery and the risks involved. Specifically we  discussed risk of bleeding, infection, pain, scarring, intra-abdominal injury or injury to bladder or ureters. We discussed the possibility of an end colostomy versus colorectal anastomosis with loop ileostomy depending on the state of his abdomen at the time of surgery. We discussed general risks of heart attack, pneumonia, stroke, blood clot, death, anastomotic leak and abscess. He expressed understanding. Questions were welcomed and answered.      Initial visit 11/04/17:  [facility]  Consult Note  [name]  1956/07/25  [id number].  Requesting [name]: Karmen Bongo  Chief Complaint/Reason for Consult: pelvic abscess  HPI:  [name] is a 62yo male PMH DM, right BKA, CAD (s/p stenting on plavix), DDD, and heart failure, who presented to [facility] today with 2 weeks of abdominal pain. States that he initially started to feel better then over the last few days the pain got worse. It is mostly in his lower abdomen. Equally painful on the left and right sides. Worse with palpation and PO intake. He reports nausea and chills, no emesis. He also reports some intermittent diarrhea, but states that this is not abnormal for him. States that he has never had pain like this before. Patient was seen by his PCP who ordered a CT scan on 2/21 which showed a 5cm right-sided pelvic abscess most likely related to sigmoid diverticulitis. General surgery asked to consult.  Abdominal surgical history: cholecystectomy, umbilical and inguinal hernia repairs  ROS:  Review of Systems  Constitutional: Positive for chills.  HENT: Negative.  Eyes: Negative.  Respiratory: Negative.  Cardiovascular: Negative.  Gastrointestinal: Positive for abdominal pain, diarrhea and nausea. Negative for constipation and vomiting.  Genitourinary: Negative.  Musculoskeletal: Negative.  Skin: Negative.  Neurological: Negative.   All systems reviewed and otherwise negative except for as above       Family History   Problem Relation Age of Onset  . Heart disease Father    Before age 52- CAD, BPG  . Diabetes Father    Amputation  . Cancer Father    PROSTATE  . Hyperlipidemia Father   . Hypertension Father   . Heart attack Father    Triple BPG  . Varicose Veins Father   .  Colon cancer Brother   . Cancer Brother    Colon  . Diabetes Brother   . Heart disease Brother 27   A-Fib. Before age 41  . Hyperlipidemia Brother   . Hypertension Brother   . Cancer Sister    Breast  . Hyperlipidemia Sister   . Hypertension Sister   . Hypertension Son   . Arthritis Unknown    GRANDMOTHER  . Hypertension Unknown    OTHER FAMILY MEMBERS       Past Medical History:  Diagnosis Date  . Barrett's esophagus   . Carotid artery occlusion 11/10/10   LEFT CAROTID ENDARTERECTOMY  . CHF (congestive heart failure) ([place])   . Complication of anesthesia    BP WENT UP AT [facility] "  . COPD (chronic obstructive pulmonary disease) ([place])   . Coronary artery disease   . Diabetes mellitus   . Diverticulosis of colon (without mention of hemorrhage)   . DJD (degenerative joint disease)   . Emphysema of lung ([place])   . Fatty liver   . Full dentures   . GERD (gastroesophageal reflux disease)   . H/O Doppler ultrasound 06/28/2012   Lower venous duplex left-- findings consistent with acute deep vein thrombosis involving the personal vein of the left lower extremity. No evidence of Baker's cyst on the left. Duplex scan of the left lower extremity arteries reveal adequate blood flow.   . H/O hiatal hernia   . History of cardiovascular stress test 03/23/2011   ejection fraction measured 51%, with an end-diastolic volume of 466 ml and an end-systolic volume of 81 ml. no evidence of myocardial ischemia.  Marland Kitchen History of echocardiogram 08/22/2011   Mildly hypertrophic left ventricle with normal systolic function and mild diastolic dysfunction. mildly dilated left atrium. no significant valvular abnormalities. no pericardial effusion  no change from previous study 10/2010. the ejection fraction =>55%  . History of Holter monitoring 02/14/2012   Scan quality is fair due to baseline artifact. Predominant rhythm is Sinus. Veentricular Ectopy was noted in single beat form. The total recording time was 24:00 hours. total beats =104061. the total time analyzed was 23:58 hours. Heart Max = 102 at 1:18:22, Min= 52 at 9:56:19 am., Avg= 70. There were 0 pause(s) > 2.5 seconds. the longest pause = 0.00 sec. at 1:51 pm(1)  . Hyperlipidemia   . Hypertension   . Neuromuscular disorder ([place])    peripheral neuropathy  . OSA (obstructive sleep apnea) 12/26/08   AHI of 6.3/hr overall, 32.7/hr during REM sleep and an RDI of 24.8/hr and 50.9/hr.  . Osteomyelitis ([place])   . Osteomyelitis ([place])    left 5th metatarsal  . PAD (peripheral artery disease) ([place])    Distal aortogram June 2012. Atherectomy left popliteal artery July 2012.   . Sleep apnea    has not used CPAP in e years - took self off  . Slurred speech    AS PER WIFE IN D/C NOTE 11/10/10  . Substernal chest pain   . Visual color changes    LEFT EYE; BUT NOT TOTAL BLINDNESS  . Wears glasses         Past Surgical History:  Procedure Laterality Date  . AMPUTATION  11/05/2011   Procedure: AMPUTATION RAY; Surgeon: Wylene Simmer, [name]; Location: [place]; Service: Orthopedics; Laterality: Right; Amputation of Right 4&5th Toes  . AMPUTATION Left 11/26/2012   Procedure: AMPUTATION RAY; Surgeon: Wylene Simmer, [name]; Location: [place]; Service: Orthopedics; Laterality: Left; fourth ray amputation  . AMPUTATION Right 08/27/2014  Procedure: Transmetatarsal Amputation; Surgeon: Newt Minion, [name]; Location: [place]; Service: Orthopedics; Laterality: Right;  . AMPUTATION Right 01/14/2015   Procedure: AMPUTATION BELOW KNEE; Surgeon: Newt Minion, [name]; Location: [place]; Service: Orthopedics; Laterality: Right;  . AMPUTATION Left 10/21/2015   Procedure: Left Foot 5th Ray Amputation; Surgeon: Newt Minion, [name];  Location: [place]; Service: Orthopedics; Laterality: Left;  . ANTERIOR FUSION CERVICAL SPINE  02/06/06   C4-5, C5-6, C6-7; SURGEON DR. MAX COHEN  . CARDIAC CATHETERIZATION  10/31/04   2009  . CAROTID ENDARTERECTOMY  11/10/10  . CAROTID ENDARTERECTOMY Left 11/10/2010   Subtotal occlusion of left internal carotid artery with left hemispheric transient ischemic attacks.  . CARPAL TUNNEL RELEASE Right 10/21/2013   Procedure: RIGHT CARPAL TUNNEL RELEASE; Surgeon: Wynonia Sours, [name]; Location: [place]; Service: Orthopedics; Laterality: Right;  . CHOLECYSTECTOMY    . COLONOSCOPY    . ESOPHAGEAL MANOMETRY Bilateral 07/19/2014   Procedure: ESOPHAGEAL MANOMETRY (EM); Surgeon: Jerene Bears, [name]; Location: [facility]; Service: Gastroenterology; Laterality: Bilateral;  . FEMORAL ARTERY STENT     x6  . FOOT SURGERY  04/25/2016   EXCISION BASE 5TH METATARSAL AND PARTIAL CUBOID LEFT FOOT  . HERNIA REPAIR     LEFT INGUINAL AND UMBILICAL REPAIRS  . I&D EXTREMITY Left 04/25/2016   Procedure: EXCISION BASE 5TH METATARSAL AND PARTIAL CUBOID LEFT FOOT; Surgeon: Newt Minion, [name]; Location: [place]; Service: Orthopedics; Laterality: Left;  . JOINT REPLACEMENT Right 2001   Total  . LAMINECTOMY     X 3 LUMBAR AND X 2 CERVICAL SPINE OPERATIONS  . LAPAROSCOPIC CHOLECYSTECTOMY W/ CHOLANGIOGRAPHY  11/09/04   SURGEON DR. Luella Cook  . LEFT HEART CATHETERIZATION WITH CORONARY ANGIOGRAM N/A 10/29/2014   Procedure: LEFT HEART CATHETERIZATION WITH CORONARY ANGIOGRAM; Surgeon: Laverda Page, [name]; Location: [facility] C[facility]; Service: Cardiovascular; Laterality: N/A;  . LOWER EXTREMITY ANGIOGRAM N/A 03/19/2012   Procedure: LOWER EXTREMITY ANGIOGRAM; Surgeon: Burnell Blanks, [name]; Location: [facility] C[facility] LAB; Service: Cardiovascular; Laterality: N/A;  . NECK SURGERY    . PENILE PROSTHESIS IMPLANT  08/14/05   INFRAPUBIC INSERTION OF INFLATABLE PENILE PROSTHESIS; SURGEON DR. Amalia Hailey  . PERCUTANEOUS CORONARY STENT  INTERVENTION (PCI-S) Right 10/29/2014   Procedure: PERCUTANEOUS CORONARY STENT INTERVENTION (PCI-S); Surgeon: Laverda Page, [name]; Location: [facility] CATH LAB; Service: Cardiovascular; Laterality: Right;  . SHOULDER ARTHROSCOPY    . SPINE SURGERY    . TONSILLECTOMY    . TOTAL KNEE ARTHROPLASTY  07/2002   RIGHT KNEE ; SURGEON DR. [name] ALSO HAD ARTHROSCOPIC RIGHT KNEE IN 10/2001  . ULNAR NERVE TRANSPOSITION Right 10/21/2013   Procedure: RIGHT ELBOW ULNAR NERVE DECOMPRESSION; Surgeon: Wynonia Sours, [name]; Location: [place]; Service: Orthopedics; Laterality: Right;   Social History: reports that he has been smoking e-cigarettes. He has a 17.50 pack-year smoking history. he has never used smokeless tobacco. He reports that he does not drink alcohol or use drugs.  Allergies:       Allergies  Allergen Reactions  . Ivp Dye [Iodinated Diagnostic Agents] Anaphylaxis and Other (See Comments)    Breathing problems   . Adhesive [Tape] Rash    Rash after 2-3 days use    (Not in a hospital admission)         Prior to Admission medications   Medication Sig Start Date End Date Taking? Authorizing Provider  amLODipine-olmesartan (AZOR) 5-20 MG tablet Take 2 tablets by mouth every evening. 08/01/16  Yes Adrian Prows, [name]  aspirin  EC 81 MG tablet Take 81 mg by mouth daily.   Yes Provider, Historical, [name]  atorvastatin (LIPITOR) 40 MG tablet Take 40 mg by mouth daily.   Yes Provider, Historical, [name]  carvedilol (COREG) 25 MG tablet Take 1.5 tablets (37.5 mg total) by mouth 2 (two) times daily. 08/01/16  Yes Adrian Prows, [name]  clobetasol (TEMOVATE) 0.05 % external solution Apply 1 application topically daily as needed (dry scalp).   Yes Provider, Historical, [name]  clopidogrel (PLAVIX) 75 MG tablet Take 75 mg by mouth daily.  03/28/12  Yes Provider, Historical, [name]  clotrimazole-betamethasone (LOTRISONE) cream Apply 1 application topically daily as needed (dry skin - beard).    Yes Provider, Historical, [name]   collagenase (SANTYL) ointment Apply topically daily.  Patient taking differently: Apply 1 application topically daily. TO AFFECTED SITE(S) 09/06/15  Yes Lavina Hamman, [name]  fentaNYL (DURAGESIC - DOSED MCG/HR) 50 MCG/HR Place 1 patch (50 mcg total) onto the skin every 3 (three) days. 05/10/16  Yes Tat, Shanon Brow, [name]  fluticasone (CUTIVATE) 0.05 % cream Apply 1 application topically 2 (two) times daily as needed (dry skin on face).    Yes Provider, Historical, [name]  glyBURIDE-metformin (GLUCOVANCE) 2.5-500 MG per tablet Take 2 tablets by mouth 2 (two) times daily with a meal.  03/05/12  Yes Provider, Historical, [name]  HYDROmorphone (DILAUDID) 2 MG tablet Take 1 tablet (2 mg total) by mouth at bedtime.  Patient taking differently: Take 2 mg by mouth at bedtime as needed (for breakthrough pain).  05/10/16  Yes Tat, Shanon Brow, [name]  insulin glargine (LANTUS) 100 UNIT/ML injection Inject 0.6 mLs (60 Units total) into the skin at bedtime.  Patient taking differently: Inject 50 Units into the skin at bedtime.  05/11/16  Yes Tat, Shanon Brow, [name]  Insulin Glulisine (APIDRA) 100 UNIT/ML Solostar Pen Inject 13 Units into the skin 3 (three) times daily with meals.  Patient taking differently: Inject 15-18 Units into the skin 3 (three) times daily as needed (for CBG). Sliding scale 05/11/16  Yes Tat, Shanon Brow, [name]  isosorbide mononitrate (IMDUR) 120 MG 24 hr tablet Take 120 mg by mouth daily.   Yes Provider, Historical, [name]  Linaclotide (LINZESS) 145 MCG CAPS capsule Take 1 capsule (145 mcg total) by mouth daily as needed.  Patient taking differently: Take 145 mcg by mouth daily as needed (constipation).  01/20/15  Yes Florencia Reasons, [name]  lisinopril (PRINIVIL,ZESTRIL) 10 MG tablet Take 10 mg by mouth daily.   Yes Provider, Historical, [name]  Multiple Vitamin (MULTIVITAMIN WITH MINERALS) TABS tablet Take 1 tablet by mouth daily.   Yes Provider, Historical, [name]  nitroGLYCERIN (NITRO-BID) 2 % ointment Apply 0.75 inches topically daily as needed for chest  pain.    Yes Provider, Historical, [name]  nitroGLYCERIN (NITRODUR - DOSED IN MG/24 HR) 0.2 mg/hr patch Place 1 patch (0.2 mg total) onto the skin daily. 01/08/17  Yes Suzan Slick, NP  nitroGLYCERIN (NITROSTAT) 0.4 MG SL tablet Place 1 tablet (0.4 mg total) under the tongue every 5 (five) minutes as needed (as needed for esophageal spasm).  Patient taking differently: Place 0.4 mg under the tongue every 5 (five) minutes as needed for chest pain.  08/09/14  Yes Pyrtle, Lajuan Lines, [name]  omega-3 acid ethyl esters (LOVAZA) 1 G capsule Take 2 g by mouth 2 (two) times daily.   Yes Provider, Historical, [name]  ondansetron (ZOFRAN-ODT) 4 MG disintegrating tablet Take 4 mg by mouth 4 (four) times daily as needed for nausea.   Yes  Provider, Historical, [name]  oxyCODONE-acetaminophen (PERCOCET/ROXICET) 5-325 MG tablet Take 1 tablet by mouth every 8 (eight) hours as needed.  Patient taking differently: Take 1 tablet by mouth 2 (two) times daily as needed for moderate pain.  05/10/16  Yes Tat, Shanon Brow, [name]  pantoprazole (PROTONIX) 40 MG tablet Take 1 tablet (40 mg total) by mouth daily before breakfast. 10/30/14  Yes Adrian Prows, [name]  pregabalin (LYRICA) 100 MG capsule Take 1 capsule (100 mg total) by mouth 3 (three) times daily. 05/10/16  Yes Tat, Shanon Brow, [name]  Probiotic CAPS Take 2 capsules by mouth daily after breakfast.   Yes Provider, Historical, [name]  Propylene Glycol (SYSTANE BALANCE OP) Place 2-3 drops into both eyes daily as needed (for dry eyes).   Yes Provider, Historical, [name]  rOPINIRole (REQUIP) 2 MG tablet Take 2 mg by mouth at bedtime.    Yes Provider, Historical, [name]  Tamsulosin HCl (FLOMAX) 0.4 MG CAPS Take 0.4 mg by mouth daily.   Yes Provider, Historical, [name]  torsemide (DEMADEX) 20 MG tablet Take 20 mg by mouth every morning. MAY TAKE AN EXTRA 20 MG ONCE A DAY FOR EXCESSIVE FLUID RETENTION   Yes Provider, Historical, [name]  Blood pressure (!) 197/97, pulse 67, temperature 99.1 F (37.3 C), temperature source Oral, resp. rate  20, SpO2 100 %.  Physical Exam:  General: pleasant, WD/WN white male who is laying in bed in NAD  HEENT: head is normocephalic, atraumatic. Sclera are noninjected. Pupils equal and round. Ears and nose without any masses or lesions. Mouth is pink and moist. Dentition fair  Heart: regular, rate, and rhythm. No obvious murmurs, gallops, or rubs noted. 2+ L DP pulse  Lungs: CTAB, no wheezes, rhonchi, or rales noted. Respiratory effort nonlabored  Abd: soft, distedned, +BS, no masses, hernias, or organomegaly. +TTP R and L lower quadrants with voluntary guarding and no rebound  MS: s/p R BKA. Left calf soft and nontender  Skin: warm and dry with no masses, lesions, or rashes  Psych: A&Ox3 with an appropriate affect.  Neuro: cranial nerves grossly intact, extremity CSM intact bilaterally, normal speech  Lab Results Last 48 Hours  Imaging Results (Last 48 hours)     Assessment/Plan      Past Medical History:  Diagnosis Date  . Barrett's esophagus   . Carotid artery occlusion 11/10/10   LEFT CAROTID ENDARTERECTOMY  . CHF (congestive heart failure) ([place])   . Complication of anesthesia    BP WENT UP AT [facility] "  . COPD (chronic obstructive pulmonary disease) ([place])   . Coronary artery disease   . Diabetes mellitus   . Diverticulosis of colon (without mention of hemorrhage)   . DJD (degenerative joint disease)   . Emphysema of lung ([place])   . Fatty liver   . Full dentures   . GERD (gastroesophageal reflux disease)   .  H/O Doppler ultrasound 06/28/2012   Lower venous duplex left-- findings consistent with acute deep vein thrombosis involving the personal vein of the left lower extremity. No evidence of Baker's cyst on the left. Duplex scan of the left lower extremity arteries reveal adequate blood flow.   . H/O hiatal hernia   . History of cardiovascular stress test 03/23/2011   ejection fraction measured 51%, with an end-diastolic volume of 427 ml and an end-systolic volume of 81 ml. no evidence of myocardial ischemia.  Marland Kitchen History of echocardiogram 08/22/2011   Mildly hypertrophic left ventricle with normal systolic function and mild diastolic dysfunction. mildly dilated left atrium. no significant valvular abnormalities. no pericardial effusion no change from previous study 10/2010. the ejection fraction =>55%  . History of Holter monitoring 02/14/2012   Scan quality is fair due to baseline artifact. Predominant rhythm is Sinus. Veentricular Ectopy was noted in single beat form. The total recording time was 24:00 hours. total beats =104061. the total time analyzed was 23:58 hours. Heart Max = 102 at 1:18:22, Min= 52 at 9:56:19 am., Avg= 70. There were 0 pause(s) > 2.5 seconds. the longest pause = 0.00 sec. at 1:51 pm(1)  . Hyperlipidemia   . Hypertension   . Neuromuscular disorder ([place])    peripheral neuropathy  . OSA (obstructive sleep apnea) 12/26/08   AHI of 6.3/hr overall, 32.7/hr during REM sleep and an RDI of 24.8/hr and 50.9/hr.  . Osteomyelitis ([place])   . Osteomyelitis ([place])    left 5th metatarsal  . PAD (peripheral artery disease) ([place])    Distal aortogram June 2012. Atherectomy left popliteal artery July 2012.   . Sleep apnea    has not used CPAP in e years - took self off  . Slurred speech    AS PER WIFE IN D/C NOTE 11/10/10  . Substernal chest pain   . Visual color changes    LEFT EYE; BUT NOT TOTAL BLINDNESS  . Wears glasses    Pelvic abscess  - 2 weeks of lower abdominal pain  - prior  abdominal surgeries: cholecystectomy, inguinal and hernia repairs  - last colonoscopy 2015 showed moderate diverticulosis of the descending and sigmoid colon  - CT scan shows 5 cm right-sided pelvic abscess most likely related to sigmoid diverticulitis. It is possible this is ruptured appendicitis if the patient still has his appendix. I think that is less likely. There is significant surrounding inflammatory phlegmon involving the terminal ileum and also the dome of the bladder  - WBC 6.6, lactic acid 1.92, TMAX 99.1  ID - zosyn 2/25>>  VTE - SCDs  FEN - IVF, NPO  Foley - none  Follow up - TBD  Plan - Admit to medicine for multiple medical problems. Pelvic abscess likely due to diverticulitis. Recommend NPO  for bowel rest, IVF, IV antibiotics. Agree with IR consult for consideration of percutaneous drainage. Hold plavix.  Will continue to follow.

## 2017-12-26 NOTE — H&P (Signed)
[name] Documented: 12/26/2017 9:00 AM Location: [facility] Patient #: [id number] DOB: [birth year] Married / Language: [name] / Race: White Male  History of Present Illness ([name] A. Harold Heller MD; 12/26/2017 12:29 PM) Patient words: He is here today to discuss surgery. He had a diverticular abscess that was drained at the end of February. The fistula has failed to heal. He is having feculent/purulent drainage into the bag as well as some leakage of stool and gas around the drain.  The patient is a 62 year old male.   Past Surgical History Harold Lorenzo, LPN; 0/45/4098 1:19 AM) Carotid Artery Surgery Left. Foot Surgery Bilateral. Knee Surgery Right. Oral Surgery Shoulder Surgery Right. Spinal Surgery - Lower Back Spinal Surgery - Neck  Diagnostic Studies History Harold Lorenzo, LPN; 1/47/8295 6:21 AM) Colonoscopy 1-5 years ago  Medication History Harold Lorenzo, LPN; 11/15/6576 46:96 AM) Atorvastatin Calcium (40MG Tablet, Oral) Active. Lyrica (100MG Capsule, Oral) Active. Oxycodone-Acetaminophen (5-325MG Tablet, Oral) Active. FentaNYL (50MCG/HR Patch 72HR, Transdermal) Active. Clopidogrel Bisulfate (75MG Tablet, Oral) Active. Carvedilol (25MG Tablet, Oral) Active. Isosorbide Mononitrate ER (120MG Tablet ER 24HR, Oral) Active. Linzess (145MCG Capsule, Oral) Active. Lisinopril (10MG Tablet, Oral) Active. MetFORMIN HCl (1000MG Tablet, Oral) Active. Nitroglycerin (0.2MG/HR Patch 24HR, Transdermal) Active. OneTouch Ultra 2 (w/Device Kit,) Active. ROPINIRole HCl (2MG Tablet, Oral) Active. Tamsulosin HCl (0.4MG Capsule, Oral) Active. Torsemide (20MG Tablet, Oral) Active. Amlodipine-Olmesartan (5-20MG Tablet, Oral) Active. Probiotic (Oral) Active. Medications Reconciled  Social History Harold Lorenzo, LPN; 2/95/2841 3:24 AM) Alcohol use Remotely quit alcohol use. Caffeine use Carbonated beverages, Coffee, Tea. No drug use Tobacco  use Current every day smoker.  Family History Harold Lorenzo, LPN; 12/10/270 5:36 AM) Alcohol Abuse Family Members In General. Arthritis Brother, Family Members In General, Sister. Breast Cancer Sister. Colon Cancer Brother. Colon Polyps Brother. Diabetes Mellitus Brother, Family Members In [place], Father. Heart Disease Brother, Father. Hypertension Brother, Family Members In [place], Father, Sister, Son.  Other Problems Harold Lorenzo, LPN; 6/44/0347 4:25 AM) Arthritis Back Pain Chest pain Congestive Heart Failure Diabetes Mellitus Enlarged Prostate Gastroesophageal Reflux Disease High blood pressure Hypercholesterolemia Inguinal Hernia Pulmonary Embolism / Blood Clot in Legs     Review of Systems Harold Billings Dockery LPN; 9/56/3875 6:43 AM) General Present- Appetite Loss, Chills and Fatigue. Not Present- Fever, Night Sweats, Weight Gain and Weight Loss. Skin Present- Dryness. Not Present- Change in Wart/Mole, Hives, Jaundice, New Lesions, Non-Healing Wounds, Rash and Ulcer. HEENT Present- Wears glasses/contact lenses. Not Present- Earache, Hearing Loss, Hoarseness, Nose Bleed, Oral Ulcers, Ringing in the Ears, Seasonal Allergies, Sinus Pain, Sore Throat, Visual Disturbances and Yellow Eyes. Respiratory Not Present- Bloody sputum, Chronic Cough, Difficulty Breathing, Snoring and Wheezing. Breast Not Present- Breast Mass, Breast Pain, Nipple Discharge and Skin Changes. Cardiovascular Present- Chest Pain and Swelling of Extremities. Not Present- Difficulty Breathing Lying Down, Leg Cramps, Palpitations, Rapid Heart Rate and Shortness of Breath. Gastrointestinal Present- Abdominal Pain, Chronic diarrhea and Nausea. Not Present- Bloating, Bloody Stool, Change in Bowel Habits, Constipation, Difficulty Swallowing, Excessive gas, Gets full quickly at meals, Hemorrhoids, Indigestion, Rectal Pain and Vomiting. Male Genitourinary Present- Frequency. Not Present- Blood in  Urine, Change in Urinary Stream, Impotence, Nocturia, Painful Urination, Urgency and Urine Leakage. Musculoskeletal Present- Back Pain, Joint Pain and Swelling of Extremities. Not Present- Joint Stiffness, Muscle Pain and Muscle Weakness. Neurological Present- Numbness, Tingling and Weakness. Not Present- Decreased Memory, Fainting, Headaches, Seizures, Tremor and Trouble walking. Psychiatric Not Present- Anxiety, Bipolar, Change in Sleep Pattern, Depression, Fearful and Frequent crying. Endocrine Present-  Cold Intolerance. Not Present- Excessive Hunger, Hair Changes, Heat Intolerance, Hot flashes and New Diabetes. Hematology Not Present- Blood Thinners, Easy Bruising, Excessive bleeding, Gland problems, HIV and Persistent Infections.  Vitals Harold Billings Dockery LPN; 02/27/3558 74:16 AM) 12/26/2017 11:56 AM Weight: 251 lb Height: 74in Body Surface Area: 2.39 m Body Mass Index: 32.23 kg/m  Temp.: 97.7F(Temporal)  Pulse: 88 (Regular)  BP: 158/72 (Sitting, Left Arm, Standard)      Physical Exam ([name] A. Harold Heller MD; 12/26/2017 12:30 PM)  The physical exam findings are as follows: Note:He is alert and cooperative Extraocular motions intact, no scleral icterus moist mucus membranes, Neck without mass or thyromegaly Unlabored respirations Regular rate and rhythm Abdomen is soft, it remains tender in the lower fields. In the low midline there is a drain exit site with feculent drainage around the tube. The bag connected to the drain contains liquid feculent purulent fluid approximately 20 cc, he states he last emptied A yesterday around lunchtime.    Assessment & Plan ([name] A. Harold Rack MD; 12/26/2017 12:32 PM)  DIVERTICULITIS (L84.53) Story: Unfortunately the fistula has failed to heal. Awaiting cardiac clearance but he is tentatively scheduled for sigmoid colectomy in mid May. I had a long discussion with him today about the surgery and the risks involved. Specifically we  discussed risk of bleeding, infection, pain, scarring, intra-abdominal injury or injury to bladder or ureters. We discussed the possibility of an end colostomy versus colorectal anastomosis with loop ileostomy depending on the state of his abdomen at the time of surgery. We discussed general risks of heart attack, pneumonia, stroke, blood clot, death, anastomotic leak and abscess. He expressed understanding. Questions were welcomed and answered.      Initial visit 11/04/17:  [facility]  Consult Note  [name]  1956/07/25  [id number].  Requesting MD: Karmen Bongo  Chief Complaint/Reason for Consult: pelvic abscess  HPI:  [name] is a 62yo male PMH DM, right BKA, CAD (s/p stenting on plavix), DDD, and heart failure, who presented to [facility] today with 2 weeks of abdominal pain. States that he initially started to feel better then over the last few days the pain got worse. It is mostly in his lower abdomen. Equally painful on the left and right sides. Worse with palpation and PO intake. He reports nausea and chills, no emesis. He also reports some intermittent diarrhea, but states that this is not abnormal for him. States that he has never had pain like this before. Patient was seen by his PCP who ordered a CT scan on 2/21 which showed a 5cm right-sided pelvic abscess most likely related to sigmoid diverticulitis. General surgery asked to consult.  Abdominal surgical history: cholecystectomy, umbilical and inguinal hernia repairs  ROS:  Review of Systems  Constitutional: Positive for chills.  HENT: Negative.  Eyes: Negative.  Respiratory: Negative.  Cardiovascular: Negative.  Gastrointestinal: Positive for abdominal pain, diarrhea and nausea. Negative for constipation and vomiting.  Genitourinary: Negative.  Musculoskeletal: Negative.  Skin: Negative.  Neurological: Negative.   All systems reviewed and otherwise negative except for as above       Family History   Problem Relation Age of Onset  . Heart disease Father    Before age 52- CAD, BPG  . Diabetes Father    Amputation  . Cancer Father    PROSTATE  . Hyperlipidemia Father   . Hypertension Father   . Heart attack Father    Triple BPG  . Varicose Veins Father   .  Colon cancer Brother   . Cancer Brother    Colon  . Diabetes Brother   . Heart disease Brother 27   A-Fib. Before age 41  . Hyperlipidemia Brother   . Hypertension Brother   . Cancer Sister    Breast  . Hyperlipidemia Sister   . Hypertension Sister   . Hypertension Son   . Arthritis Unknown    GRANDMOTHER  . Hypertension Unknown    OTHER FAMILY MEMBERS       Past Medical History:  Diagnosis Date  . Barrett's esophagus   . Carotid artery occlusion 11/10/10   LEFT CAROTID ENDARTERECTOMY  . CHF (congestive heart failure) ([place])   . Complication of anesthesia    BP WENT UP AT [facility] "  . COPD (chronic obstructive pulmonary disease) ([place])   . Coronary artery disease   . Diabetes mellitus   . Diverticulosis of colon (without mention of hemorrhage)   . DJD (degenerative joint disease)   . Emphysema of lung ([place])   . Fatty liver   . Full dentures   . GERD (gastroesophageal reflux disease)   . H/O Doppler ultrasound 06/28/2012   Lower venous duplex left-- findings consistent with acute deep vein thrombosis involving the personal vein of the left lower extremity. No evidence of Baker's cyst on the left. Duplex scan of the left lower extremity arteries reveal adequate blood flow.   . H/O hiatal hernia   . History of cardiovascular stress test 03/23/2011   ejection fraction measured 51%, with an end-diastolic volume of 466 ml and an end-systolic volume of 81 ml. no evidence of myocardial ischemia.  Marland Kitchen History of echocardiogram 08/22/2011   Mildly hypertrophic left ventricle with normal systolic function and mild diastolic dysfunction. mildly dilated left atrium. no significant valvular abnormalities. no pericardial effusion  no change from previous study 10/2010. the ejection fraction =>55%  . History of Holter monitoring 02/14/2012   Scan quality is fair due to baseline artifact. Predominant rhythm is Sinus. Veentricular Ectopy was noted in single beat form. The total recording time was 24:00 hours. total beats =104061. the total time analyzed was 23:58 hours. Heart Max = 102 at 1:18:22, Min= 52 at 9:56:19 am., Avg= 70. There were 0 pause(s) > 2.5 seconds. the longest pause = 0.00 sec. at 1:51 pm(1)  . Hyperlipidemia   . Hypertension   . Neuromuscular disorder ([place])    peripheral neuropathy  . OSA (obstructive sleep apnea) 12/26/08   AHI of 6.3/hr overall, 32.7/hr during REM sleep and an RDI of 24.8/hr and 50.9/hr.  . Osteomyelitis ([place])   . Osteomyelitis ([place])    left 5th metatarsal  . PAD (peripheral artery disease) ([place])    Distal aortogram June 2012. Atherectomy left popliteal artery July 2012.   . Sleep apnea    has not used CPAP in e years - took self off  . Slurred speech    AS PER WIFE IN D/C NOTE 11/10/10  . Substernal chest pain   . Visual color changes    LEFT EYE; BUT NOT TOTAL BLINDNESS  . Wears glasses         Past Surgical History:  Procedure Laterality Date  . AMPUTATION  11/05/2011   Procedure: AMPUTATION RAY; Surgeon: Wylene Simmer, MD; Location: [place]; Service: Orthopedics; Laterality: Right; Amputation of Right 4&5th Toes  . AMPUTATION Left 11/26/2012   Procedure: AMPUTATION RAY; Surgeon: Wylene Simmer, MD; Location: [place]; Service: Orthopedics; Laterality: Left; fourth ray amputation  . AMPUTATION Right 08/27/2014  Procedure: Transmetatarsal Amputation; Surgeon: Newt Minion, MD; Location: [place]; Service: Orthopedics; Laterality: Right;  . AMPUTATION Right 01/14/2015   Procedure: AMPUTATION BELOW KNEE; Surgeon: Newt Minion, MD; Location: [place]; Service: Orthopedics; Laterality: Right;  . AMPUTATION Left 10/21/2015   Procedure: Left Foot 5th Ray Amputation; Surgeon: Newt Minion, MD;  Location: [place]; Service: Orthopedics; Laterality: Left;  . ANTERIOR FUSION CERVICAL SPINE  02/06/06   C4-5, C5-6, C6-7; SURGEON DR. MAX COHEN  . CARDIAC CATHETERIZATION  10/31/04   2009  . CAROTID ENDARTERECTOMY  11/10/10  . CAROTID ENDARTERECTOMY Left 11/10/2010   Subtotal occlusion of left internal carotid artery with left hemispheric transient ischemic attacks.  . CARPAL TUNNEL RELEASE Right 10/21/2013   Procedure: RIGHT CARPAL TUNNEL RELEASE; Surgeon: Wynonia Sours, MD; Location: [place]; Service: Orthopedics; Laterality: Right;  . CHOLECYSTECTOMY    . COLONOSCOPY    . ESOPHAGEAL MANOMETRY Bilateral 07/19/2014   Procedure: ESOPHAGEAL MANOMETRY (EM); Surgeon: Jerene Bears, MD; Location: [facility]; Service: Gastroenterology; Laterality: Bilateral;  . FEMORAL ARTERY STENT     x6  . FOOT SURGERY  04/25/2016   EXCISION BASE 5TH METATARSAL AND PARTIAL CUBOID LEFT FOOT  . HERNIA REPAIR     LEFT INGUINAL AND UMBILICAL REPAIRS  . I&D EXTREMITY Left 04/25/2016   Procedure: EXCISION BASE 5TH METATARSAL AND PARTIAL CUBOID LEFT FOOT; Surgeon: Newt Minion, MD; Location: [place]; Service: Orthopedics; Laterality: Left;  . JOINT REPLACEMENT Right 2001   Total  . LAMINECTOMY     X 3 LUMBAR AND X 2 CERVICAL SPINE OPERATIONS  . LAPAROSCOPIC CHOLECYSTECTOMY W/ CHOLANGIOGRAPHY  11/09/04   SURGEON DR. Luella Cook  . LEFT HEART CATHETERIZATION WITH CORONARY ANGIOGRAM N/A 10/29/2014   Procedure: LEFT HEART CATHETERIZATION WITH CORONARY ANGIOGRAM; Surgeon: Laverda Page, MD; Location: [facility] C[facility]; Service: Cardiovascular; Laterality: N/A;  . LOWER EXTREMITY ANGIOGRAM N/A 03/19/2012   Procedure: LOWER EXTREMITY ANGIOGRAM; Surgeon: Burnell Blanks, MD; Location: [facility] C[facility] LAB; Service: Cardiovascular; Laterality: N/A;  . NECK SURGERY    . PENILE PROSTHESIS IMPLANT  08/14/05   INFRAPUBIC INSERTION OF INFLATABLE PENILE PROSTHESIS; SURGEON DR. Amalia Hailey  . PERCUTANEOUS CORONARY STENT  INTERVENTION (PCI-S) Right 10/29/2014   Procedure: PERCUTANEOUS CORONARY STENT INTERVENTION (PCI-S); Surgeon: Laverda Page, MD; Location: [facility] CATH LAB; Service: Cardiovascular; Laterality: Right;  . SHOULDER ARTHROSCOPY    . SPINE SURGERY    . TONSILLECTOMY    . TOTAL KNEE ARTHROPLASTY  07/2002   RIGHT KNEE ; SURGEON DR. [name] ALSO HAD ARTHROSCOPIC RIGHT KNEE IN 10/2001  . ULNAR NERVE TRANSPOSITION Right 10/21/2013   Procedure: RIGHT ELBOW ULNAR NERVE DECOMPRESSION; Surgeon: Wynonia Sours, MD; Location: [place]; Service: Orthopedics; Laterality: Right;   Social History: reports that he has been smoking e-cigarettes. He has a 17.50 pack-year smoking history. he has never used smokeless tobacco. He reports that he does not drink alcohol or use drugs.  Allergies:       Allergies  Allergen Reactions  . Ivp Dye [Iodinated Diagnostic Agents] Anaphylaxis and Other (See Comments)    Breathing problems   . Adhesive [Tape] Rash    Rash after 2-3 days use    (Not in a hospital admission)         Prior to Admission medications   Medication Sig Start Date End Date Taking? Authorizing Provider  amLODipine-olmesartan (AZOR) 5-20 MG tablet Take 2 tablets by mouth every evening. 08/01/16  Yes Adrian Prows, MD  aspirin  EC 81 MG tablet Take 81 mg by mouth daily.   Yes [provider]  atorvastatin (LIPITOR) 40 MG tablet Take 40 mg by mouth daily.   Yes [provider]  carvedilol (COREG) 25 MG tablet Take 1.5 tablets (37.5 mg total) by mouth 2 (two) times daily. 08/01/16  Yes Adrian Prows, MD  clobetasol (TEMOVATE) 0.05 % external solution Apply 1 application topically daily as needed (dry scalp).   Yes [provider]  clopidogrel (PLAVIX) 75 MG tablet Take 75 mg by mouth daily.  03/28/12  Yes [provider]  clotrimazole-betamethasone (LOTRISONE) cream Apply 1 application topically daily as needed (dry skin - beard).    Yes [provider]   collagenase (SANTYL) ointment Apply topically daily.  Patient taking differently: Apply 1 application topically daily. TO AFFECTED SITE(S) 09/06/15  Yes Lavina Hamman, MD  fentaNYL (DURAGESIC - DOSED MCG/HR) 50 MCG/HR Place 1 patch (50 mcg total) onto the skin every 3 (three) days. 05/10/16  Yes Tat, Shanon Brow, MD  fluticasone (CUTIVATE) 0.05 % cream Apply 1 application topically 2 (two) times daily as needed (dry skin on face).    Yes [provider]  glyBURIDE-metformin (GLUCOVANCE) 2.5-500 MG per tablet Take 2 tablets by mouth 2 (two) times daily with a meal.  03/05/12  Yes [provider]  HYDROmorphone (DILAUDID) 2 MG tablet Take 1 tablet (2 mg total) by mouth at bedtime.  Patient taking differently: Take 2 mg by mouth at bedtime as needed (for breakthrough pain).  05/10/16  Yes Tat, Shanon Brow, MD  insulin glargine (LANTUS) 100 UNIT/ML injection Inject 0.6 mLs (60 Units total) into the skin at bedtime.  Patient taking differently: Inject 50 Units into the skin at bedtime.  05/11/16  Yes Tat, Shanon Brow, MD  Insulin Glulisine (APIDRA) 100 UNIT/ML Solostar Pen Inject 13 Units into the skin 3 (three) times daily with meals.  Patient taking differently: Inject 15-18 Units into the skin 3 (three) times daily as needed (for CBG). Sliding scale 05/11/16  Yes Tat, Shanon Brow, MD  isosorbide mononitrate (IMDUR) 120 MG 24 hr tablet Take 120 mg by mouth daily.   Yes [provider]  Linaclotide (LINZESS) 145 MCG CAPS capsule Take 1 capsule (145 mcg total) by mouth daily as needed.  Patient taking differently: Take 145 mcg by mouth daily as needed (constipation).  01/20/15  Yes Florencia Reasons, MD  lisinopril (PRINIVIL,ZESTRIL) 10 MG tablet Take 10 mg by mouth daily.   Yes [provider]  Multiple Vitamin (MULTIVITAMIN WITH MINERALS) TABS tablet Take 1 tablet by mouth daily.   Yes [provider]  nitroGLYCERIN (NITRO-BID) 2 % ointment Apply 0.75 inches topically daily as needed for chest  pain.    Yes [provider]  nitroGLYCERIN (NITRODUR - DOSED IN MG/24 HR) 0.2 mg/hr patch Place 1 patch (0.2 mg total) onto the skin daily. 01/08/17  Yes Suzan Slick, NP  nitroGLYCERIN (NITROSTAT) 0.4 MG SL tablet Place 1 tablet (0.4 mg total) under the tongue every 5 (five) minutes as needed (as needed for esophageal spasm).  Patient taking differently: Place 0.4 mg under the tongue every 5 (five) minutes as needed for chest pain.  08/09/14  Yes Pyrtle, Lajuan Lines, MD  omega-3 acid ethyl esters (LOVAZA) 1 G capsule Take 2 g by mouth 2 (two) times daily.   Yes [provider]  ondansetron (ZOFRAN-ODT) 4 MG disintegrating tablet Take 4 mg by mouth 4 (four) times daily as needed for nausea.   Yes  [provider]  oxyCODONE-acetaminophen (PERCOCET/ROXICET) 5-325 MG tablet Take 1 tablet by mouth every 8 (eight) hours as needed.  Patient taking differently: Take 1 tablet by mouth 2 (two) times daily as needed for moderate pain.  05/10/16  Yes Tat, Shanon Brow, MD  pantoprazole (PROTONIX) 40 MG tablet Take 1 tablet (40 mg total) by mouth daily before breakfast. 10/30/14  Yes Adrian Prows, MD  pregabalin (LYRICA) 100 MG capsule Take 1 capsule (100 mg total) by mouth 3 (three) times daily. 05/10/16  Yes Tat, Shanon Brow, MD  Probiotic CAPS Take 2 capsules by mouth daily after breakfast.   Yes [provider]  Propylene Glycol (SYSTANE BALANCE OP) Place 2-3 drops into both eyes daily as needed (for dry eyes).   Yes [provider]  rOPINIRole (REQUIP) 2 MG tablet Take 2 mg by mouth at bedtime.    Yes [provider]  Tamsulosin HCl (FLOMAX) 0.4 MG CAPS Take 0.4 mg by mouth daily.   Yes [provider]  torsemide (DEMADEX) 20 MG tablet Take 20 mg by mouth every morning. MAY TAKE AN EXTRA 20 MG ONCE A DAY FOR EXCESSIVE FLUID RETENTION   Yes [provider]  Blood pressure (!) 197/97, pulse 67, temperature 99.1 F (37.3 C), temperature source Oral, resp. rate  20, SpO2 100 %.  Physical Exam:  General: pleasant, WD/WN white male who is laying in bed in NAD  HEENT: head is normocephalic, atraumatic. Sclera are noninjected. Pupils equal and round. Ears and nose without any masses or lesions. Mouth is pink and moist. Dentition fair  Heart: regular, rate, and rhythm. No obvious murmurs, gallops, or rubs noted. 2+ L DP pulse  Lungs: CTAB, no wheezes, rhonchi, or rales noted. Respiratory effort nonlabored  Abd: soft, distedned, +BS, no masses, hernias, or organomegaly. +TTP R and L lower quadrants with voluntary guarding and no rebound  MS: s/p R BKA. Left calf soft and nontender  Skin: warm and dry with no masses, lesions, or rashes  Psych: A&Ox3 with an appropriate affect.  Neuro: cranial nerves grossly intact, extremity CSM intact bilaterally, normal speech  Lab Results Last 48 Hours  Imaging Results (Last 48 hours)     Assessment/Plan      Past Medical History:  Diagnosis Date  . Barrett's esophagus   . Carotid artery occlusion 11/10/10   LEFT CAROTID ENDARTERECTOMY  . CHF (congestive heart failure) ([place])   . Complication of anesthesia    BP WENT UP AT [facility] "  . COPD (chronic obstructive pulmonary disease) ([place])   . Coronary artery disease   . Diabetes mellitus   . Diverticulosis of colon (without mention of hemorrhage)   . DJD (degenerative joint disease)   . Emphysema of lung ([place])   . Fatty liver   . Full dentures   . GERD (gastroesophageal reflux disease)   .  H/O Doppler ultrasound 06/28/2012   Lower venous duplex left-- findings consistent with acute deep vein thrombosis involving the personal vein of the left lower extremity. No evidence of Baker's cyst on the left. Duplex scan of the left lower extremity arteries reveal adequate blood flow.   . H/O hiatal hernia   . History of cardiovascular stress test 03/23/2011   ejection fraction measured 51%, with an end-diastolic volume of 427 ml and an end-systolic volume of 81 ml. no evidence of myocardial ischemia.  Marland Kitchen History of echocardiogram 08/22/2011   Mildly hypertrophic left ventricle with normal systolic function and mild diastolic dysfunction. mildly dilated left atrium. no significant valvular abnormalities. no pericardial effusion no change from previous study 10/2010. the ejection fraction =>55%  . History of Holter monitoring 02/14/2012   Scan quality is fair due to baseline artifact. Predominant rhythm is Sinus. Veentricular Ectopy was noted in single beat form. The total recording time was 24:00 hours. total beats =104061. the total time analyzed was 23:58 hours. Heart Max = 102 at 1:18:22, Min= 52 at 9:56:19 am., Avg= 70. There were 0 pause(s) > 2.5 seconds. the longest pause = 0.00 sec. at 1:51 pm(1)  . Hyperlipidemia   . Hypertension   . Neuromuscular disorder ([place])    peripheral neuropathy  . OSA (obstructive sleep apnea) 12/26/08   AHI of 6.3/hr overall, 32.7/hr during REM sleep and an RDI of 24.8/hr and 50.9/hr.  . Osteomyelitis ([place])   . Osteomyelitis ([place])    left 5th metatarsal  . PAD (peripheral artery disease) ([place])    Distal aortogram June 2012. Atherectomy left popliteal artery July 2012.   . Sleep apnea    has not used CPAP in e years - took self off  . Slurred speech    AS PER WIFE IN D/C NOTE 11/10/10  . Substernal chest pain   . Visual color changes    LEFT EYE; BUT NOT TOTAL BLINDNESS  . Wears glasses    Pelvic abscess  - 2 weeks of lower abdominal pain  - prior  abdominal surgeries: cholecystectomy, inguinal and hernia repairs  - last colonoscopy 2015 showed moderate diverticulosis of the descending and sigmoid colon  - CT scan shows 5 cm right-sided pelvic abscess most likely related to sigmoid diverticulitis. It is possible this is ruptured appendicitis if the patient still has his appendix. I think that is less likely. There is significant surrounding inflammatory phlegmon involving the terminal ileum and also the dome of the bladder  - WBC 6.6, lactic acid 1.92, TMAX 99.1  ID - zosyn 2/25>>  VTE - SCDs  FEN - IVF, NPO  Foley - none  Follow up - TBD  Plan - Admit to medicine for multiple medical problems. Pelvic abscess likely due to diverticulitis. Recommend NPO  for bowel rest, IVF, IV antibiotics. Agree with IR consult for consideration of percutaneous drainage. Hold plavix.  Will continue to follow.

## 2018-01-02 DIAGNOSIS — E1122 Type 2 diabetes mellitus with diabetic chronic kidney disease: Secondary | ICD-10-CM | POA: Diagnosis not present

## 2018-01-02 DIAGNOSIS — K5720 Diverticulitis of large intestine with perforation and abscess without bleeding: Secondary | ICD-10-CM | POA: Diagnosis not present

## 2018-01-02 DIAGNOSIS — Z4682 Encounter for fitting and adjustment of non-vascular catheter: Secondary | ICD-10-CM | POA: Diagnosis not present

## 2018-01-03 ENCOUNTER — Encounter (HOSPITAL_COMMUNITY): Payer: Self-pay | Admitting: Emergency Medicine

## 2018-01-03 ENCOUNTER — Emergency Department (HOSPITAL_COMMUNITY): Payer: 59

## 2018-01-03 ENCOUNTER — Emergency Department (HOSPITAL_COMMUNITY)
Admission: EM | Admit: 2018-01-03 | Discharge: 2018-01-04 | Disposition: A | Discharge status: Home / Self Care | Payer: 59 | Attending: Emergency Medicine | Admitting: Emergency Medicine

## 2018-01-03 ENCOUNTER — Other Ambulatory Visit: Payer: Self-pay

## 2018-01-03 DIAGNOSIS — J449 Chronic obstructive pulmonary disease, unspecified: Secondary | ICD-10-CM | POA: Insufficient documentation

## 2018-01-03 DIAGNOSIS — Y33XXXA Other specified events, undetermined intent, initial encounter: Secondary | ICD-10-CM | POA: Diagnosis not present

## 2018-01-03 DIAGNOSIS — R1030 Lower abdominal pain, unspecified: Secondary | ICD-10-CM | POA: Diagnosis not present

## 2018-01-03 DIAGNOSIS — Z89422 Acquired absence of other left toe(s): Secondary | ICD-10-CM | POA: Diagnosis not present

## 2018-01-03 DIAGNOSIS — Z89511 Acquired absence of right leg below knee: Secondary | ICD-10-CM | POA: Diagnosis not present

## 2018-01-03 DIAGNOSIS — T148XXA Other injury of unspecified body region, initial encounter: Secondary | ICD-10-CM | POA: Insufficient documentation

## 2018-01-03 DIAGNOSIS — Z794 Long term (current) use of insulin: Secondary | ICD-10-CM | POA: Diagnosis not present

## 2018-01-03 DIAGNOSIS — Y998 Other external cause status: Secondary | ICD-10-CM | POA: Insufficient documentation

## 2018-01-03 DIAGNOSIS — L24A9 Irritant contact dermatitis due friction or contact with other specified body fluids: Secondary | ICD-10-CM

## 2018-01-03 DIAGNOSIS — R1084 Generalized abdominal pain: Secondary | ICD-10-CM | POA: Diagnosis present

## 2018-01-03 DIAGNOSIS — R1031 Right lower quadrant pain: Secondary | ICD-10-CM | POA: Insufficient documentation

## 2018-01-03 DIAGNOSIS — E1169 Type 2 diabetes mellitus with other specified complication: Secondary | ICD-10-CM | POA: Insufficient documentation

## 2018-01-03 DIAGNOSIS — Y92009 Unspecified place in unspecified non-institutional (private) residence as the place of occurrence of the external cause: Secondary | ICD-10-CM | POA: Diagnosis not present

## 2018-01-03 DIAGNOSIS — K9189 Other postprocedural complications and disorders of digestive system: Secondary | ICD-10-CM | POA: Diagnosis not present

## 2018-01-03 DIAGNOSIS — Y939 Activity, unspecified: Secondary | ICD-10-CM | POA: Insufficient documentation

## 2018-01-03 DIAGNOSIS — I5032 Chronic diastolic (congestive) heart failure: Secondary | ICD-10-CM | POA: Insufficient documentation

## 2018-01-03 DIAGNOSIS — I110 Hypertensive heart disease with heart failure: Secondary | ICD-10-CM | POA: Diagnosis not present

## 2018-01-03 LAB — CBC WITH DIFFERENTIAL/PLATELET
Basophils Absolute: 0 10*3/uL (ref 0.0–0.1)
Basophils Relative: 0 %
Eosinophils Absolute: 0.2 10*3/uL (ref 0.0–0.7)
Eosinophils Relative: 4 %
HCT: 29.6 % — ABNORMAL LOW (ref 39.0–52.0)
Hemoglobin: 9.9 g/dL — ABNORMAL LOW (ref 13.0–17.0)
Lymphocytes Relative: 28 %
Lymphs Abs: 1.7 10*3/uL (ref 0.7–4.0)
MCH: 27.3 pg (ref 26.0–34.0)
MCHC: 33.4 g/dL (ref 30.0–36.0)
MCV: 81.8 fL (ref 78.0–100.0)
Monocytes Absolute: 0.5 10*3/uL (ref 0.1–1.0)
Monocytes Relative: 8 %
Neutro Abs: 3.7 10*3/uL (ref 1.7–7.7)
Neutrophils Relative %: 60 %
Platelets: 215 10*3/uL (ref 150–400)
RBC: 3.62 MIL/uL — ABNORMAL LOW (ref 4.22–5.81)
RDW: 13.9 % (ref 11.5–15.5)
WBC: 6.1 10*3/uL (ref 4.0–10.5)

## 2018-01-03 LAB — COMPREHENSIVE METABOLIC PANEL
ALT: 10 U/L — ABNORMAL LOW (ref 17–63)
AST: 13 U/L — ABNORMAL LOW (ref 15–41)
Albumin: 3.4 g/dL — ABNORMAL LOW (ref 3.5–5.0)
Alkaline Phosphatase: 111 U/L (ref 38–126)
Anion gap: 11 (ref 5–15)
BUN: 15 mg/dL (ref 6–20)
CO2: 25 mmol/L (ref 22–32)
Calcium: 8.5 mg/dL — ABNORMAL LOW (ref 8.9–10.3)
Chloride: 103 mmol/L (ref 101–111)
Creatinine, Ser: 1.54 mg/dL — ABNORMAL HIGH (ref 0.61–1.24)
GFR calc Af Amer: 54 mL/min — ABNORMAL LOW (ref >60)
GFR calc non Af Amer: 46 mL/min — ABNORMAL LOW (ref >60)
Glucose, Bld: 132 mg/dL — ABNORMAL HIGH (ref 65–99)
Potassium: 3.6 mmol/L (ref 3.5–5.1)
Sodium: 139 mmol/L (ref 135–145)
Total Bilirubin: 0.3 mg/dL (ref 0.3–1.2)
Total Protein: 6.8 g/dL (ref 6.5–8.1)

## 2018-01-03 LAB — LIPASE, BLOOD: Lipase: 26 U/L (ref 11–51)

## 2018-01-03 NOTE — ED Triage Notes (Signed)
Patient states that he has a drain that is in his colon. Patient states the drain is coming out. Patient was suppose to go see surgeon today at [facility] but did not make it. He is also complaining of lower abdominal pain.

## 2018-01-03 NOTE — ED Provider Notes (Signed)
[facility] DEPT Provider Note   CSN: [id number] Arrival date & time: 01/03/18  2005     History   Chief Complaint Chief Complaint  Patient presents with  . Drainage from Incision    HPI [name] is a 62 y.o. male with history of CHF, COPD, type 2 diabetes, HLD, HTN, OSA, PAD, and intra-abdominal abscess presents for evaluation of acute onset, progressively worsening abdominal pain for 3 days.  The patient is currently being treated for diverticular abscess that was drained at the end of February.  The fistula has failed to heal and he is currently being followed by Dr. Windle Guard of general surgery with scheduled sigmoid colectomy.  Patient states that he has had pain in his abdomen intermittently but over the past 3 days he has had a constant sharp stabbing pain to the right lower quadrant near his drain site.  He also notes that for the past week his drain has been leaking purulent and feculent material.  He states he has had to change his clothing multiple times due to the leakage.  He also notes intermittently bloody drainage into his bag.  He denies melena or hematochezia with bowel movements.  He denies hematuria, urgency, frequency, or dysuria.  He endorses feeling subjectively hot and cold but is unsure if he has had a true fever.  He denies nausea or vomiting.  He has taken some nausea medicine for his chronic nausea with some improvement.  He states that Dr. Marjory Sneddon office called to instruct him to go to [facility] to obtain a CT scan of the abdomen for further evaluation.  He states he was told that he may need to have surgery sooner than already scheduled.  The history is provided by the patient.    Past Medical History:  Diagnosis Date  . Barrett's esophagus   . Carotid artery occlusion 11/10/10   LEFT CAROTID ENDARTERECTOMY  . CHF (congestive heart failure) ([place])   . Complication of anesthesia    BP WENT UP AT [facility] "  . COPD (chronic  obstructive pulmonary disease) ([place])   . Coronary artery disease   . Diabetes mellitus   . Diverticulitis   . Diverticulosis of colon (without mention of hemorrhage)   . DJD (degenerative joint disease)   . Fatty liver   . Full dentures   . GERD (gastroesophageal reflux disease)   . H/O Doppler ultrasound 06/28/2012   Lower venous duplex left-- findings consistent with acute deep vein thrombosis involving the personal vein of the left lower extremity. No evidence of Baker's cyst on the left. Duplex scan of the left lower extremity arteries reveal adequate blood flow.   . H/O hiatal hernia   . History of cardiovascular stress test 03/23/2011   ejection fraction measured 51%, with an end-diastolic volume of 196 ml and an end-systolic volume of 81 ml. no evidence of myocardial ischemia.  Marland Kitchen History of echocardiogram 08/22/2011   Mildly hypertrophic left ventricle with normal systolic function and mild diastolic dysfunction. mildly dilated left atrium. no significant valvular abnormalities. no pericardial effusion no change from previous study 10/2010. the ejection fraction =>55%  . History of Holter monitoring 02/14/2012   Scan quality is fair due to baseline artifact. Predominant rhythm is Sinus. Veentricular Ectopy was noted in single beat form. The total recording time was 24:00 hours. total beats =[id number]. the total time analyzed was 23:58 hours. Heart  Max = 102 at 1:18:22, Min= 52 at 9:56:19 am., Avg= 70.  There were 0 pause(s) > 2.5 seconds. the longest pause = 0.00 sec. at 1:51 pm(1)  . Hyperlipidemia   . Hypertension   . Neuromuscular disorder ([place])    peripheral neuropathy  . OSA (obstructive sleep apnea) 12/26/2008   took himself off CPAP; AHI of 6.3/hr overall, 32.7/hr during REM sleep and an RDI of 24.8/hr and 50.9/hr.  . Osteomyelitis ([place])    left 5th metatarsal  . PAD (peripheral artery disease) ([place])    Distal aortogram June 2012. Atherectomy left popliteal artery July 2012.   Marland Kitchen  Slurred speech    AS PER WIFE IN D/C NOTE 11/10/10  . Substernal chest pain   . Visual color changes    LEFT EYE; BUT NOT TOTAL BLINDNESS  . Wears glasses     Patient Active Problem List   Diagnosis Date Noted  . Intra-abdominal abscess ([place]) 11/04/2017  . Diverticulitis 11/04/2017  . Chronic venous hypertension (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 ([place]) 12/11/2016  . Non-pressure chronic ulcer of other part of left foot limited to breakdown of skin ([place]) 11/12/2016  . Unilateral primary osteoarthritis, left knee 10/11/2016  . Midfoot ulcer, left, limited to breakdown of skin ([place]) 10/11/2016  . Hypertension 07/30/2016  . History of right below knee amputation ([place]) 07/12/2016  . Poorly controlled type 2 diabetes mellitus with peripheral neuropathy ([place]) 05/09/2016  . Pressure ulcer 05/08/2016  . Diabetic foot infection ([place]) 05/04/2016  . Diabetic foot ([place]) 05/04/2016  . Type 2 diabetes mellitus with insulin therapy ([place]) 12/16/2015  . Amputated toe ([place]) 10/21/2015  . Diabetic foot ulcer ([place]) 10/12/2015  . Leg edema, left 09/03/2015  . Acute renal failure ([place]) 09/03/2015  . CAD (dz of distal, mid and proximal RCA with implantation of 3 overlapping drug-eluting stent,) 09/03/2015  . Chronic diastolic heart failure, NYHA class 2 ([place]) 09/03/2015  . Opacity of lung on imaging study 01/14/2015  . Angina pectoris associated with type 2 diabetes mellitus ([place]) 10/28/2014  . Hyperlipidemia 08/25/2014  . Chronic total occlusion of artery of the extremities ([place]) 04/08/2012  . Onychomycosis 02/01/2012  . Diabetes mellitus, insulin dependent (IDDM), controlled ([place]) 11/01/2011  . Occlusion and stenosis of carotid artery without mention of cerebral infarction 06/12/2011  . Aftercare following surgery of the circulatory system, [place] 06/12/2011  . GERD (gastroesophageal reflux disease) 05/08/2011  . Barrett's esophagus without dysplasia 05/08/2011  . PVD  (peripheral vascular disease) ([place]) 02/01/2011  . HTN (hypertension) 02/01/2011  . Former tobacco use 02/01/2011    Past Surgical History:  Procedure Laterality Date  . AMPUTATION  11/05/2011   Procedure: AMPUTATION RAY;  Surgeon: Wylene Simmer, MD;  Location: [facility] [facility];  Service: Orthopedics;  Laterality: Right;  Amputation of Right 4&5th Toes  . AMPUTATION Left 11/26/2012   Procedure: AMPUTATION RAY;  Surgeon: Wylene Simmer, MD;  Location: [facility];  Service: Orthopedics;  Laterality: Left;  fourth ray amputation  . AMPUTATION Right 08/27/2014   Procedure: Transmetatarsal Amputation;  Surgeon: Newt Minion, MD;  Location: [place];  Service: Orthopedics;  Laterality: Right;  . AMPUTATION Right 01/14/2015   Procedure: AMPUTATION BELOW KNEE;  Surgeon: Newt Minion, MD;  Location: [facility];  Service: Orthopedics;  Laterality: Right;  . AMPUTATION Left 10/21/2015   Procedure: Left Foot 5th Ray Amputation;  Surgeon: Newt Minion, MD;  Location: [facility];  Service: Orthopedics;  Laterality: Left;  . ANTERIOR FUSION CERVICAL SPINE  02/06/06   C4-5, C5-6, C6-7; SURGEON DR. MAX COHEN  .  CARDIAC CATHETERIZATION  10/31/04   2009  . CAROTID ENDARTERECTOMY  11/10/10  . CAROTID ENDARTERECTOMY Left 11/10/2010   Subtotal occlusion of left internal carotid artery with left hemispheric transient ischemic attacks.  . CARPAL TUNNEL RELEASE Right 10/21/2013   Procedure: RIGHT CARPAL TUNNEL RELEASE;  Surgeon: Wynonia Sours, MD;  Location: [facility];  Service: Orthopedics;  Laterality: Right;  . CHOLECYSTECTOMY    . COLONOSCOPY    . ESOPHAGEAL MANOMETRY Bilateral 07/19/2014   Procedure: ESOPHAGEAL MANOMETRY (EM);  Surgeon: Jerene Bears, MD;  Location: [facility];  Service: Gastroenterology;  Laterality: Bilateral;  . FEMORAL ARTERY STENT     x6  . FOOT SURGERY  04/25/2016    EXCISION BASE 5TH METATARSAL AND PARTIAL CUBOID LEFT FOOT  . HERNIA REPAIR     LEFT INGUINAL AND UMBILICAL REPAIRS  . I&D EXTREMITY Left  04/25/2016   Procedure: EXCISION BASE 5TH METATARSAL AND PARTIAL CUBOID LEFT FOOT;  Surgeon: Newt Minion, MD;  Location: [facility];  Service: Orthopedics;  Laterality: Left;  . IR RADIOLOGIST EVAL & MGMT  11/19/2017  . IR RADIOLOGIST EVAL & MGMT  12/03/2017  . IR RADIOLOGIST EVAL & MGMT  12/18/2017  . JOINT REPLACEMENT Right 2001   Total  . LAMINECTOMY     X 3 LUMBAR AND X 2 CERVICAL SPINE OPERATIONS  . LAPAROSCOPIC CHOLECYSTECTOMY W/ CHOLANGIOGRAPHY  11/09/04   SURGEON DR. Luella Cook  . LEFT HEART CATHETERIZATION WITH CORONARY ANGIOGRAM N/A 10/29/2014   Procedure: LEFT HEART CATHETERIZATION WITH CORONARY ANGIOGRAM;  Surgeon: Laverda Page, MD;  Location: [facility] CATH LAB;  Service: Cardiovascular;  Laterality: N/A;  . LOWER EXTREMITY ANGIOGRAM N/A 03/19/2012   Procedure: LOWER EXTREMITY ANGIOGRAM;  Surgeon: Burnell Blanks, MD;  Location[facility] C[facility] LAB;  Service: Cardiovascular;  Laterality: N/A;  . NECK SURGERY    . PENILE PROSTHESIS IMPLANT  08/14/05   INFRAPUBIC INSERTION OF INFLATABLE PENILE PROSTHESIS; SURGEON DR. Amalia Hailey  . PERCUTANEOUS CORONARY STENT INTERVENTION (PCI-S) Right 10/29/2014   Procedure: PERCUTANEOUS CORONARY STENT INTERVENTION (PCI-S);  Surgeon: Laverda Page, MD;  Location: [facility] CATH LAB;  Service: Cardiovascular;  Laterality: Right;  . SHOULDER ARTHROSCOPY    . SPINE SURGERY    . TONSILLECTOMY    . TOTAL KNEE ARTHROPLASTY  07/2002   RIGHT KNEE ; SURGEON  DR. [name] ALSO HAD ARTHROSCOPIC RIGHT KNEE IN  10/2001  . ULNAR NERVE TRANSPOSITION Right 10/21/2013   Procedure: RIGHT ELBOW  ULNAR NERVE DECOMPRESSION;  Surgeon: Wynonia Sours, MD;  Location: [facility];  Service: Orthopedics;  Laterality: Right;        Home Medications    Prior to Admission medications   Medication Sig Start Date End Date Taking? Authorizing Provider  amLODipine-olmesartan (AZOR) 5-20 MG tablet Take 2 tablets by mouth every evening. 08/01/16  Yes Adrian Prows, MD  aspirin EC 81  MG tablet Take 81 mg by mouth daily.   Yes [provider]  atorvastatin (LIPITOR) 40 MG tablet Take 40 mg by mouth daily.   Yes [provider]  carvedilol (COREG) 25 MG tablet Take 1.5 tablets (37.5 mg total) by mouth 2 (two) times daily. 08/01/16  Yes Adrian Prows, MD  clobetasol (TEMOVATE) 0.05 % external solution Apply 1 application topically daily as needed (dry scalp).   Yes [provider]  clopidogrel (PLAVIX) 75 MG tablet Take 75 mg by mouth daily.  03/28/12  Yes [provider]  clotrimazole-betamethasone (LOTRISONE) cream Apply 1  application topically daily as needed (dry skin - beard).    Yes [provider]  collagenase (SANTYL) ointment Apply topically daily. Patient taking differently: Apply 1 application topically daily. TO AFFECTED SITE(S) 09/06/15  Yes Lavina Hamman, MD  fentaNYL (DURAGESIC - DOSED MCG/HR) 50 MCG/HR Place 1 patch (50 mcg total) onto the skin every 3 (three) days. 05/10/16  Yes Tat, Shanon Brow, MD  fluticasone (CUTIVATE) 0.05 % cream Apply 1 application topically 2 (two) times daily as needed (dry skin on face).    Yes [provider]  HUMALOG KWIKPEN 100 UNIT/ML KiwkPen Inject 15 Units into the skin 3 (three) times daily with meals. 15 units with breakfast, lunch, & dinner-- pt may increase dose by 1 unit if sugar is elevated 11/14/17  Yes [provider]  HYDROmorphone (DILAUDID) 2 MG tablet Take 1 tablet (2 mg total) by mouth at bedtime. Patient taking differently: Take 2 mg by mouth at bedtime as needed (for breakthrough pain).  05/10/16  Yes Tat, Shanon Brow, MD  Insulin Glargine (BASAGLAR KWIKPEN) 100 UNIT/ML SOPN Inject 60 Units into the skin at bedtime. 12/17/17  Yes [provider]  isosorbide mononitrate (IMDUR) 120 MG 24 hr tablet Take 120 mg by mouth daily.   Yes [provider]  Linaclotide (LINZESS) 145 MCG CAPS capsule Take 1 capsule (145 mcg total) by mouth daily as needed. Patient taking  differently: Take 145 mcg by mouth daily as needed (constipation).  01/20/15  Yes Florencia Reasons, MD  lisinopril (PRINIVIL,ZESTRIL) 20 MG tablet Take 20 mg by mouth daily. 12/17/17  Yes [provider]  metFORMIN (GLUCOPHAGE) 1000 MG tablet Take 1,000 mg by mouth 2 (two) times daily. 12/18/17  Yes [provider]  Multiple Vitamin (MULTIVITAMIN WITH MINERALS) TABS tablet Take 1 tablet by mouth daily.   Yes [provider]  nitroGLYCERIN (NITRO-BID) 2 % ointment Apply 0.75 inches topically daily as needed for chest pain.    Yes [provider]  nitroGLYCERIN (NITRODUR - DOSED IN MG/24 HR) 0.2 mg/hr patch Place 1 patch (0.2 mg total) onto the skin daily. Patient taking differently: Place 0.2 mg onto the skin daily as needed (chest pain).  01/08/17  Yes Suzan Slick, NP  nitroGLYCERIN (NITROSTAT) 0.4 MG SL tablet Place 1 tablet (0.4 mg total) under the tongue every 5 (five) minutes as needed (as needed for esophageal spasm). Patient taking differently: Place 0.4 mg under the tongue every 5 (five) minutes as needed for chest pain.  08/09/14  Yes Pyrtle, Lajuan Lines, MD  omega-3 acid ethyl esters (LOVAZA) 1 G capsule Take 2 g by mouth 2 (two) times daily.   Yes [provider]  ondansetron (ZOFRAN-ODT) 4 MG disintegrating tablet Take 4 mg by mouth 4 (four) times daily as needed for nausea.   Yes [provider]  oxyCODONE-acetaminophen (PERCOCET/ROXICET) 5-325 MG tablet Take 1 tablet by mouth every 8 (eight) hours as needed. Patient taking differently: Take 1 tablet by mouth 2 (two) times daily as needed for moderate pain.  05/10/16  Yes Tat, Shanon Brow, MD  pantoprazole (PROTONIX) 40 MG tablet Take 1 tablet (40 mg total) by mouth daily before breakfast. 10/30/14  Yes Adrian Prows, MD  pregabalin (LYRICA) 100 MG capsule Take 1 capsule (100 mg total) by mouth 3 (three) times daily. 05/10/16  Yes TatShanon Brow, MD  Probiotic CAPS Take 2 capsules by mouth daily after breakfast.   Yes  [provider]  Propylene Glycol (SYSTANE BALANCE OP) Place 2-3 drops into  both eyes daily as needed (for dry eyes).   Yes [provider]  rOPINIRole (REQUIP) 2 MG tablet Take 2 mg by mouth at bedtime.    Yes [provider]  Tamsulosin HCl (FLOMAX) 0.4 MG CAPS Take 0.4 mg by mouth daily.   Yes [provider]  torsemide (DEMADEX) 20 MG tablet Take 20 mg by mouth every morning. MAY TAKE AN EXTRA 20 MG ONCE A DAY FOR EXCESSIVE FLUID RETENTION   Yes [provider]  amoxicillin-clavulanate (AUGMENTIN) 875-125 MG tablet Take 1 tablet by mouth every 12 (twelve) hours. Patient not taking: Reported on 01/03/2018 11/10/17   Jonetta Osgood, MD  insulin glargine (LANTUS) 100 UNIT/ML injection Inject 0.6 mLs (60 Units total) into the skin at bedtime. Patient not taking: Reported on 01/03/2018 05/11/16   Orson Eva, MD  Insulin Glulisine (APIDRA) 100 UNIT/ML Solostar Pen Inject 13 Units into the skin 3 (three) times daily with meals. Patient not taking: Reported on 01/03/2018 05/11/16   Orson Eva, MD    Family History Family History  Problem Relation Age of Onset  . Heart disease Father        Before age 22-  CAD, BPG  . Diabetes Father        Amputation  . Cancer Father        PROSTATE  . Hyperlipidemia Father   . Hypertension Father   . Heart attack Father        Triple BPG  . Varicose Veins Father   . Colon cancer Brother   . Cancer Brother        Colon  . Diabetes Brother   . Heart disease Brother 50       A-Fib. Before age 60  . Hyperlipidemia Brother   . Hypertension Brother   . Cancer Sister        Breast  . Hyperlipidemia Sister   . Hypertension Sister   . Hypertension Son   . Arthritis Unknown        GRANDMOTHER  . Hypertension Unknown        OTHER FAMILY MEMBERS    Social History Social History   Tobacco Use  . Smoking status: Current Every Day Smoker    Packs/day: 0.50    Years: 35.00    Pack years: 17.50    Types:  E-cigarettes    Last attempt to quit: 10/28/2011    Years since quitting: 6.1  . Smokeless tobacco: Never Used  . Tobacco comment: VAPOR CIGARETTES (Water only)  Substance Use Topics  . Alcohol use: No    Alcohol/week: 0.0 oz    Comment: "not in a long time"  . Drug use: No     Allergies   Ivp dye [iodinated diagnostic agents] and Adhesive [tape]   Review of Systems Review of Systems  Constitutional: Positive for chills and fever.  Gastrointestinal: Positive for abdominal pain. Negative for blood in stool, nausea and vomiting.  All other systems reviewed and are negative.    Physical Exam Updated Vital Signs BP (!) 150/79   Pulse 63   Temp 98.9 F (37.2 C) (Oral)   Resp 16   Ht 6\' 2"  (1.88 m)   Wt 113.4 kg (250 lb)   SpO2 98%   BMI 32.10 kg/m   Physical Exam  Constitutional: He appears well-developed and well-nourished. No distress.  HENT:  Head: Normocephalic and atraumatic.  Eyes: Conjunctivae are normal. Right eye exhibits no discharge. Left eye exhibits no discharge.  Neck:  No JVD present. No tracheal deviation present.  Cardiovascular: Normal rate, regular rhythm and normal heart sounds.  Pulmonary/Chest: Effort normal and breath sounds normal.  Abdominal: Soft. He exhibits no distension. There is tenderness. There is no guarding.  Diffuse tenderness to palpation of the abdomen, maximally tender in the right lower quadrant.  There is a small amount of malodorous feculent discharge draining from around the patient's drain site.  There is also brown drainage in his bag with no evidence of blood.  Musculoskeletal: He exhibits no edema.  Neurological: He is alert.  Skin: Skin is warm and dry. No erythema.  Psychiatric: He has a normal mood and affect. His behavior is normal.  Nursing note and vitals reviewed.    ED Treatments / Results  Labs (all labs ordered are listed, but only abnormal results are displayed) Labs Reviewed  COMPREHENSIVE METABOLIC PANEL -  Abnormal; Notable for the following components:      Result Value   Glucose, Bld 132 (*)    Creatinine, Ser 1.54 (*)    Calcium 8.5 (*)    Albumin 3.4 (*)    AST 13 (*)    ALT 10 (*)    GFR calc non Af Amer 46 (*)    GFR calc Af Amer 54 (*)    All other components within normal limits  CBC WITH DIFFERENTIAL/PLATELET - Abnormal; Notable for the following components:   RBC 3.62 (*)    Hemoglobin 9.9 (*)    HCT 29.6 (*)    All other components within normal limits  URINALYSIS, ROUTINE W REFLEX MICROSCOPIC - Abnormal; Notable for the following components:   Glucose, UA 50 (*)    Protein, ur 100 (*)    All other components within normal limits  LIPASE, BLOOD    EKG None  Radiology Ct Abdomen Pelvis Wo Contrast  Result Date: 01/04/2018 CLINICAL DATA:  Lower abdominal pain. Patient has a drain in his colon which the patient states feels like is coming out. EXAM: CT ABDOMEN AND PELVIS WITHOUT CONTRAST TECHNIQUE: Multidetector CT imaging of the abdomen and pelvis was performed following the standard protocol without IV contrast. COMPARISON:  11/19/2017 FINDINGS: Lower chest: Borderline cardiomegaly with coronary arteriosclerosis. No pericardial effusion. Bibasilar dependent atelectasis. Hepatobiliary: No focal liver abnormality is seen. Status post cholecystectomy. No biliary dilatation. Pancreas: Unremarkable. No pancreatic ductal dilatation or surrounding inflammatory changes. Spleen: Normal in size without focal abnormality. Adrenals/Urinary Tract: Normal bilateral adrenal glands. Mild perinephric fat stranding with tiny punctate nonobstructing lower pole renal calculi noted within both kidneys. The bladder is physiologically distended without acute abnormality. Stomach/Bowel: Redemonstration of pericolonic inflammation, slightly more prominent than on prior along the mildly thickened, slightly inflamed sigmoid colon with percutaneous pigtail drainage catheter seen along the anti mesenteric  border of the sigmoid. The pigtail catheter tip is outlined by air within the abscess cavity. No significant change in position is noted. A large amount of retained stool is seen within the colon from cecum through distal transverse colon. No small bowel obstruction or dilatation. The stomach is physiologically distended with enteric contrast. Vascular/Lymphatic: Mild aortoiliac atherosclerosis. Nonpathologic size para-aortic and iliac chain lymph nodes. These are likely reactive. Reproductive: Penile prosthetic pump in the right hemipelvis overlying the bladder. Prostate is unremarkable. Other: No free air or free fluid. Musculoskeletal: Degenerative disc disease. Lumbar fusion L4 through S1. IMPRESSION: 1. Redemonstration of percutaneous pigtail drainage catheter overlying the anti mesenteric border of the sigmoid colon or the there has been a known diverticular  abscess. The tip of the catheter is now outlined by air within the cavity. Mesenteric inflammation is seen about the catheter, slightly more prominent than on prior. No new abscess is seen however. 2. Uncomplicated nephrolithiasis. 3. Cardiomegaly with coronary arteriosclerosis. Electronically Signed   By: Ashley Royalty M.D.   On: 01/04/2018 00:25    Procedures Procedures (including critical care time)  Medications Ordered in ED Medications - No data to display   Initial Impression / Assessment and Plan / ED Course  I have reviewed the triage vital signs and the nursing notes.  Pertinent labs & imaging results that were available during my care of the patient were reviewed by me and considered in my medical decision making (see chart for details).     Patient with drain for management of diverticular abscess in February presents for evaluation of worsening abdominal pain and leakage of drainage from the drain site.  He is afebrile, vital signs are at the patient's baseline.  He is nontoxic in appearance.  He has diffuse generalized  tenderness to palpation although it is somewhat more severe in the right lower quadrant near his drain.  No signs of cellulitis or other secondary skin infection on examination.  He is scheduled for a sigmoid colectomy in May but states that his general surgeon recommended he present to the ED for imaging and possible rescheduling of his surgery.  Lab work reviewed by me shows no leukocytosis and the remainder of his lab work is at his baseline.  He does not appear to be septic at this time.  He is not in DKA.  He is allergic to contrast and so a noncontrast CT was obtained which showed that his drain is in place with air within the cavity of the tip of the catheter.  There is some mesenteric inflammation which is likely the source of his pain but no new abscess is identified.  No evidence of obstruction, perforation, appendicitis, colitis, AAA, or other acute surgical abdominal pathology.  No evidence of SBP.  I spoke with Dr. Lucia Gaskins with general surgery who states that the patient is stable for discharge home but will need to coordinate with Dr. Windle Guard for further evaluation and possible rescheduling of his upcoming surgery.  I discussed the findings with the patient in detail.  Serial abdominal examinations remain benign.  He is tolerating p.o. fluids in the ED without difficulty.  Discussed strict ED return precautions. Pt verbalized understanding of and agreement with plan and is safe for discharge home at this time.  Final Clinical Impressions(s) / ED Diagnoses   Final diagnoses:  Wound drainage  Generalized abdominal pain    ED Discharge Orders    None       Debroah Baller 01/04/18 1155    Blanchie Dessert, MD 01/04/18 2055

## 2018-01-04 DIAGNOSIS — R1030 Lower abdominal pain, unspecified: Secondary | ICD-10-CM | POA: Diagnosis not present

## 2018-01-04 LAB — URINALYSIS, ROUTINE W REFLEX MICROSCOPIC
Bacteria, UA: NONE SEEN
Bilirubin Urine: NEGATIVE
Glucose, UA: 50 mg/dL — AB
Hgb urine dipstick: NEGATIVE
Ketones, ur: NEGATIVE mg/dL
Leukocytes, UA: NEGATIVE
Nitrite: NEGATIVE
Protein, ur: 100 mg/dL — AB
Specific Gravity, Urine: 1.011 (ref 1.005–1.030)
pH: 5 (ref 5.0–8.0)

## 2018-01-04 NOTE — Discharge Instructions (Signed)
Your lab work and imaging today was reassuring.  Call Dr. Marjory Sneddon office first thing on Monday to set up a follow-up appointment or discuss moving the date of your surgery.  Continue to take your home medications as prescribed.  Return to the emergency department if any concerning signs or symptoms develop such as worsening pain, high fevers, persistent vomiting, or blood in the urine or stool

## 2018-01-07 ENCOUNTER — Encounter (HOSPITAL_COMMUNITY)
Admission: RE | Admit: 2018-01-07 | Discharge: 2018-01-07 | Disposition: A | Discharge status: Home / Self Care | Payer: 59 | Source: Ambulatory Visit | Attending: Surgery | Admitting: Surgery

## 2018-01-07 ENCOUNTER — Encounter (HOSPITAL_COMMUNITY): Payer: Self-pay

## 2018-01-07 ENCOUNTER — Other Ambulatory Visit: Payer: Self-pay

## 2018-01-07 ENCOUNTER — Ambulatory Visit (HOSPITAL_COMMUNITY)
Admission: RE | Admit: 2018-01-07 | Discharge: 2018-01-07 | Disposition: A | Discharge status: Home / Self Care | Payer: 59 | Source: Ambulatory Visit | Attending: Surgery | Admitting: Surgery

## 2018-01-07 DIAGNOSIS — I509 Heart failure, unspecified: Secondary | ICD-10-CM | POA: Diagnosis not present

## 2018-01-07 DIAGNOSIS — K5780 Diverticulitis of intestine, part unspecified, with perforation and abscess without bleeding: Secondary | ICD-10-CM | POA: Insufficient documentation

## 2018-01-07 DIAGNOSIS — E119 Type 2 diabetes mellitus without complications: Secondary | ICD-10-CM | POA: Diagnosis not present

## 2018-01-07 DIAGNOSIS — I2510 Atherosclerotic heart disease of native coronary artery without angina pectoris: Secondary | ICD-10-CM | POA: Insufficient documentation

## 2018-01-07 DIAGNOSIS — K219 Gastro-esophageal reflux disease without esophagitis: Secondary | ICD-10-CM | POA: Insufficient documentation

## 2018-01-07 DIAGNOSIS — Z01818 Encounter for other preprocedural examination: Secondary | ICD-10-CM | POA: Insufficient documentation

## 2018-01-07 DIAGNOSIS — Z01812 Encounter for preprocedural laboratory examination: Secondary | ICD-10-CM | POA: Diagnosis present

## 2018-01-07 DIAGNOSIS — J449 Chronic obstructive pulmonary disease, unspecified: Secondary | ICD-10-CM | POA: Diagnosis not present

## 2018-01-07 DIAGNOSIS — Z01810 Encounter for preprocedural cardiovascular examination: Secondary | ICD-10-CM

## 2018-01-07 LAB — CBC WITH DIFFERENTIAL/PLATELET
Basophils Absolute: 0 10*3/uL (ref 0.0–0.1)
Basophils Relative: 0 %
Eosinophils Absolute: 0.1 10*3/uL (ref 0.0–0.7)
Eosinophils Relative: 2 %
HCT: 29.7 % — ABNORMAL LOW (ref 39.0–52.0)
Hemoglobin: 10 g/dL — ABNORMAL LOW (ref 13.0–17.0)
Lymphocytes Relative: 20 %
Lymphs Abs: 1.3 10*3/uL (ref 0.7–4.0)
MCH: 27.6 pg (ref 26.0–34.0)
MCHC: 33.7 g/dL (ref 30.0–36.0)
MCV: 82 fL (ref 78.0–100.0)
Monocytes Absolute: 0.4 10*3/uL (ref 0.1–1.0)
Monocytes Relative: 6 %
Neutro Abs: 4.6 10*3/uL (ref 1.7–7.7)
Neutrophils Relative %: 72 %
Platelets: 192 10*3/uL (ref 150–400)
RBC: 3.62 MIL/uL — ABNORMAL LOW (ref 4.22–5.81)
RDW: 13.8 % (ref 11.5–15.5)
WBC: 6.5 10*3/uL (ref 4.0–10.5)

## 2018-01-07 LAB — COMPREHENSIVE METABOLIC PANEL
ALT: 13 U/L — ABNORMAL LOW (ref 17–63)
AST: 21 U/L (ref 15–41)
Albumin: 3.4 g/dL — ABNORMAL LOW (ref 3.5–5.0)
Alkaline Phosphatase: 117 U/L (ref 38–126)
Anion gap: 10 (ref 5–15)
BUN: 15 mg/dL (ref 6–20)
CO2: 26 mmol/L (ref 22–32)
Calcium: 8.6 mg/dL — ABNORMAL LOW (ref 8.9–10.3)
Chloride: 105 mmol/L (ref 101–111)
Creatinine, Ser: 1.4 mg/dL — ABNORMAL HIGH (ref 0.61–1.24)
GFR calc Af Amer: >60 mL/min (ref >60)
GFR calc non Af Amer: 52 mL/min — ABNORMAL LOW (ref >60)
Glucose, Bld: 165 mg/dL — ABNORMAL HIGH (ref 65–99)
Potassium: 4.5 mmol/L (ref 3.5–5.1)
Sodium: 141 mmol/L (ref 135–145)
Total Bilirubin: 0.5 mg/dL (ref 0.3–1.2)
Total Protein: 7.1 g/dL (ref 6.5–8.1)

## 2018-01-07 LAB — GLUCOSE, CAPILLARY: Glucose-Capillary: 168 mg/dL — ABNORMAL HIGH (ref 65–99)

## 2018-01-07 LAB — HEMOGLOBIN A1C
Hgb A1c MFr Bld: 7.1 % — ABNORMAL HIGH (ref 4.8–5.6)
Mean Plasma Glucose: 157.07 mg/dL

## 2018-01-07 LAB — PROTIME-INR
INR: 1
Prothrombin Time: 13.1 seconds (ref 11.4–15.2)

## 2018-01-07 LAB — APTT: aPTT: 29 seconds (ref 24–36)

## 2018-01-07 NOTE — Consult Note (Addendum)
[place] Nurse requested for preoperative stoma site marking  Discussed surgical procedure and stoma creation with patient. Explained role of the [place] nurse team.  Provided the patient with educational booklet and provided samples of pouching options.  Answered patient's questions and discussed that marking is "just in case" it is needed during surgery. Examined patient sitting, and standing in order to place the marking in the patient's visual field, away from any creases or abdominal contour issues and within the rectus muscle.  Attempted to mark below the patient's belt line, but this ws not possible related to a significant crease which appears when the patient is sitting and should be avoided if possible.  Marked for colostomy in the LLQ  __8__ cm to the left of the umbilicus and __3__JS above/below the umbilicus. Patient's abdomen cleansed with CHG wipes at site markings, allowed to air dry prior to marking. Covered mark with thin film transparent dressing to preserve mark until date of surgery. Gave marker to patient and instructed them to fill in the spot if it fade prior to surgery.  [place] Nurse team will follow up with patient after surgery for continue ostomy care and teaching if patient receives an ostomy.  Julien Girt MSN, RN, [place], [place], [place]

## 2018-01-07 NOTE — Progress Notes (Addendum)
CBC/DIFF/CMP/U/A doen 01/03/2018-epic.  EKG-11/08/2017-epic  12/05/2017- LOV- Dr [name] on chart  Stress Test- 03/18/2017- on chart

## 2018-01-07 NOTE — Patient Instructions (Addendum)
[name]  01/07/2018   Your procedure is scheduled on: 01/13/2018   Report to [facility]  Entrance    Report to admitting at  09:15 AM    Call this number if you have problems the morning of surgery [phone]   Remember: Do not eat food : NO SOLID FOOD AFTER MIDNIGHT THE NIGHT PRIOR TO SURGERY. NOTHING BY MOUTH EXCEPT CLEAR LIQUIDS UNTIL 3 HOURS PRIOR TO SCHEDULED SURGERY. PLEASE FINISH ENSURE DRINK PER SURGEON ORDER 3 HOURS PRIOR TO SCHEDULED SURGERY TIME WHICH NEEDS TO BE COMPLETED AT 0815 AM.   Take these medicines the morning of surgery with A SIP OF WATER: Carvedilol ( Coreg), Imdur, Zofran if needed, (Pantoprazole) Protonix, Flomax, Eye drops as usual, Lyrica/Percocet if needed     DO NOT TAKE ANY DIABETIC MEDICATIONS DAY OF YOUR SURGERY                Follow Bowel prep Instructions per MD                  You may not have any metal on your body including hair pins and              piercings  Do not wear jewelry,  lotions, powders or perfumes, deodorant             Men may shave face and neck.   Do not bring valuables to the hospital. [place].  Contacts, dentures or bridgework may not be worn into surgery.  Leave suitcase in the car. After surgery it may be brought to your room.                     Please read over the following fact sheets you were given: _____________________________________________________________________                CLEAR LIQUID DIET   Foods Allowed                                                                     Foods Excluded  Coffee and tea, regular and decaf                             liquids that you cannot  Plain Jell-O in any flavor                                             see through such as: Fruit ices (not with fruit pulp)                                     milk, soups, orange juice  Iced Popsicles  All  solid food Carbonated beverages, regular and diet                                    Cranberry, grape and apple juices Sports drinks like Gatorade Lightly seasoned clear broth or consume(fat free) Sugar, honey syrup  Sample Menu Breakfast                                Lunch                                     Supper Cranberry juice                    Beef broth                            Chicken broth Jell-O                                     Grape juice                           Apple juice Coffee or tea                        Jell-O                                      Popsicle                                                Coffee or tea                        Coffee or tea  _____________________________________________________________________  [facility] - Preparing for Surgery Before surgery, you can play an important role.  Because skin is not sterile, your skin needs to be as free of germs as possible.  You can reduce the number of germs on your skin by washing with CHG (chlorahexidine gluconate) soap before surgery.  CHG is an antiseptic cleaner which kills germs and bonds with the skin to continue killing germs even after washing. Please DO NOT use if you have an allergy to CHG or antibacterial soaps.  If your skin becomes reddened/irritated stop using the CHG and inform your nurse when you arrive at Short Stay. Do not shave (including legs and underarms) for at least 48 hours prior to the first CHG shower.  You may shave your face/neck. Please follow these instructions carefully:  1.  Shower with CHG Soap the night before surgery and the  morning of Surgery.  2.  If you choose to wash your hair, wash your hair first as usual with your  normal  shampoo.  3.  After you shampoo, rinse your hair and body thoroughly to remove the  shampoo.  4.  Use CHG as you would any other liquid soap.  You can apply chg directly  to the skin and wash                        Gently with a scrungie or clean washcloth.  5.  Apply the CHG Soap to your body ONLY FROM THE NECK DOWN.   Do not use on face/ open                           Wound or open sores. Avoid contact with eyes, ears mouth and genitals (private parts).                       Wash face,  Genitals (private parts) with your normal soap.             6.  Wash thoroughly, paying special attention to the area where your surgery  will be performed.  7.  Thoroughly rinse your body with warm water from the neck down.  8.  DO NOT shower/wash with your normal soap after using and rinsing off  the CHG Soap.                9.  Pat yourself dry with a clean towel.            10.  Wear clean pajamas.            11.  Place clean sheets on your bed the night of your first shower and do not  sleep with pets. Day of Surgery : Do not apply any lotions/deodorants the morning of surgery.  Please wear clean clothes to the hospital/surgery center.  FAILURE TO FOLLOW THESE INSTRUCTIONS MAY RESULT IN THE CANCELLATION OF YOUR SURGERY PATIENT SIGNATURE_________________________________  NURSE SIGNATURE__________________________________  ________________________________________________________________________   WHAT IS A BLOOD TRANSFUSION? Blood Transfusion Information  A transfusion is the replacement of blood or some of its parts. Blood is made up of multiple cells which provide different functions.  Red blood cells carry oxygen and are used for blood loss replacement.  White blood cells fight against infection.  Platelets control bleeding.  Plasma helps clot blood.  Other blood products are available for specialized needs, such as hemophilia or other clotting disorders. BEFORE THE TRANSFUSION  Who gives blood for transfusions?   Healthy volunteers who are fully evaluated to make sure their blood is safe. This is blood bank blood. Transfusion therapy is the safest it has ever been in the practice of medicine. Before  blood is taken from a donor, a complete history is taken to make sure that person has no history of diseases nor engages in risky social behavior (examples are intravenous drug use or sexual activity with multiple partners). The donor's travel history is screened to minimize risk of transmitting infections, such as malaria. The donated blood is tested for signs of infectious diseases, such as HIV and hepatitis. The blood is then tested to be sure it is compatible with you in order to minimize the chance of a transfusion reaction. If you or a relative donates blood, this is often done in anticipation of surgery and is not appropriate for emergency situations. It takes many days to process the donated blood. RISKS AND COMPLICATIONS Although transfusion therapy is very safe and saves many lives, the main dangers of transfusion include:   Getting an infectious disease.  Developing a transfusion  reaction. This is an allergic reaction to something in the blood you were given. Every precaution is taken to prevent this. The decision to have a blood transfusion has been considered carefully by your caregiver before blood is given. Blood is not given unless the benefits outweigh the risks. AFTER THE TRANSFUSION  Right after receiving a blood transfusion, you will usually feel much better and more energetic. This is especially true if your red blood cells have gotten low (anemic). The transfusion raises the level of the red blood cells which carry oxygen, and this usually causes an energy increase.  The nurse administering the transfusion will monitor you carefully for complications. HOME CARE INSTRUCTIONS  No special instructions are needed after a transfusion. You may find your energy is better. Speak with your caregiver about any limitations on activity for underlying diseases you may have. SEEK MEDICAL CARE IF:   Your condition is not improving after your transfusion.  You develop redness or irritation  at the intravenous (IV) site. SEEK IMMEDIATE MEDICAL CARE IF:  Any of the following symptoms occur over the next 12 hours:  Shaking chills.  You have a temperature by mouth above 102 F (38.9 C), not controlled by medicine.  Chest, back, or muscle pain.  People around you feel you are not acting correctly or are confused.  Shortness of breath or difficulty breathing.  Dizziness and fainting.  You get a rash or develop hives.  You have a decrease in urine output.  Your urine turns a dark color or changes to pink, red, or brown. Any of the following symptoms occur over the next 10 days:  You have a temperature by mouth above 102 F (38.9 C), not controlled by medicine.  Shortness of breath.  Weakness after normal activity.  The white part of the eye turns yellow (jaundice).  You have a decrease in the amount of urine or are urinating less often.  Your urine turns a dark color or changes to pink, red, or brown. Document Released: 08/24/2000 Document Revised: 11/19/2011 Document Reviewed: 04/12/2008 ExitCare Patient Information 2014 [place].  _______________________________________________________________________  Incentive Spirometer  An incentive spirometer is a tool that can help keep your lungs clear and active. This tool measures how well you are filling your lungs with each breath. Taking long deep breaths may help reverse or decrease the chance of developing breathing (pulmonary) problems (especially infection) following:  A long period of time when you are unable to move or be active. BEFORE THE PROCEDURE   If the spirometer includes an indicator to show your best effort, your nurse or respiratory therapist will set it to a desired goal.  If possible, sit up straight or lean slightly forward. Try not to slouch.  Hold the incentive spirometer in an upright position. INSTRUCTIONS FOR USE  1. Sit on the edge of your bed if possible, or sit up as far as  you can in bed or on a chair. 2. Hold the incentive spirometer in an upright position. 3. Breathe out normally. 4. Place the mouthpiece in your mouth and seal your lips tightly around it. 5. Breathe in slowly and as deeply as possible, raising the piston or the ball toward the top of the column. 6. Hold your breath for 3-5 seconds or for as long as possible. Allow the piston or ball to fall to the bottom of the column. 7. Remove the mouthpiece from your mouth and breathe out normally. 8. Rest for a few seconds and repeat  Steps 1 through 7 at least 10 times every 1-2 hours when you are awake. Take your time and take a few normal breaths between deep breaths. 9. The spirometer may include an indicator to show your best effort. Use the indicator as a goal to work toward during each repetition. 10. After each set of 10 deep breaths, practice coughing to be sure your lungs are clear. If you have an incision (the cut made at the time of surgery), support your incision when coughing by placing a pillow or rolled up towels firmly against it. Once you are able to get out of bed, walk around indoors and cough well. You may stop using the incentive spirometer when instructed by your caregiver.  RISKS AND COMPLICATIONS  Take your time so you do not get dizzy or light-headed.  If you are in pain, you may need to take or ask for pain medication before doing incentive spirometry. It is harder to take a deep breath if you are having pain. AFTER USE  Rest and breathe slowly and easily.  It can be helpful to keep track of a log of your progress. Your caregiver can provide you with a simple table to help with this. If you are using the spirometer at home, follow these instructions: [place] IF:   You are having difficultly using the spirometer.  You have trouble using the spirometer as often as instructed.  Your pain medication is not giving enough relief while using the spirometer.  You develop  fever of 100.5 F (38.1 C) or higher. SEEK IMMEDIATE MEDICAL CARE IF:   You cough up bloody sputum that had not been present before.  You develop fever of 102 F (38.9 C) or greater.  You develop worsening pain at or near the incision site. MAKE SURE YOU:   Understand these instructions.  Will watch your condition.  Will get help right away if you are not doing well or get worse. Document Released: 01/07/2007 Document Revised: 11/19/2011 Document Reviewed: 03/10/2007 ExitCare Patient Information 2014 ExitCare, [place].   ________________________________________________________________________ How to Manage Your Diabetes Before and After Surgery  Why is it important to control my blood sugar before and after surgery? . Improving blood sugar levels before and after surgery helps healing and can limit problems. . A way of improving blood sugar control is eating a healthy diet by: o  Eating less sugar and carbohydrates o  Increasing activity/exercise o  Talking with your doctor about reaching your blood sugar goals . High blood sugars (greater than 180 mg/dL) can raise your risk of infections and slow your recovery, so you will need to focus on controlling your diabetes during the weeks before surgery. . Make sure that the doctor who takes care of your diabetes knows about your planned surgery including the date and location.  How do I manage my blood sugar before surgery? . Check your blood sugar at least 4 times a day, starting 2 days before surgery, to make sure that the level is not too high or low. o Check your blood sugar the morning of your surgery when you wake up and every 2 hours until you get to the Short Stay unit. . If your blood sugar is less than 70 mg/dL, you will need to treat for low blood sugar: o Do not take insulin. o Treat a low blood sugar (less than 70 mg/dL) with  cup of clear juice (cranberry or apple), 4 glucose tablets, OR glucose gel. o Recheck  blood  sugar in 15 minutes after treatment (to make sure it is greater than 70 mg/dL). If your blood sugar is not greater than 70 mg/dL on recheck, call [phone] for further instructions. . Report your blood sugar to the short stay nurse when you get to Short Stay.  . If you are admitted to the hospital after surgery: o Your blood sugar will be checked by the staff and you will probably be given insulin after surgery (instead of oral diabetes medicines) to make sure you have good blood sugar levels. o The goal for blood sugar control after surgery is 80-180 mg/dL.   WHAT DO I DO ABOUT MY DIABETES MEDICATION?  Marland Kitchen Do not take oral diabetes medicines (pills) the morning of surgery.  . THE DAY BEFORE SURGERY, take your usual Metformin.Take your usual 15 U of Humalog at Breakfast, Lunch, and [employer] However no bedtime dose. Take only 30 units of Glargin insulin at bedtime.       . The day of surgery, do not take other diabetes injectables, including Byetta (exenatide), Bydureon (exenatide ER), Victoza (liraglutide), or Trulicity (dulaglutide).  . If your CBG is greater than 220 mg/dL, you may take  of your sliding scale  . (correction) dose of insulin.    For patients with insulin pumps: Contact your diabetes doctor for specific instructions before surgery. Decrease basal rates by 20% at midnight the night before your surgery. Note that if your surgery is planned to be longer than 2 hours, your insulin pump will be removed and intravenous (IV) insulin will be started and managed by the nurses and the anesthesiologist. You will be able to restart your insulin pump once you are awake and able to manage it.  Make sure to bring insulin pump supplies to the hospital with you in case the  site needs to be changed.  Patient Signature:  Date:   Nurse Signature:  Date:   Reviewed and Endorsed by [facility] Patient Education Committee, August 2015

## 2018-01-08 LAB — SURGICAL PCR SCREEN
MRSA, PCR: POSITIVE — AB
Staphylococcus aureus: POSITIVE — AB

## 2018-01-08 NOTE — Anesthesia Pain Management Evaluation Note (Signed)
01-07-18 PCR results routed to Dr. Kae Heller for review

## 2018-01-09 DIAGNOSIS — E1122 Type 2 diabetes mellitus with diabetic chronic kidney disease: Secondary | ICD-10-CM | POA: Diagnosis not present

## 2018-01-09 DIAGNOSIS — K5720 Diverticulitis of large intestine with perforation and abscess without bleeding: Secondary | ICD-10-CM | POA: Diagnosis not present

## 2018-01-09 DIAGNOSIS — Z4682 Encounter for fitting and adjustment of non-vascular catheter: Secondary | ICD-10-CM | POA: Diagnosis not present

## 2018-01-10 ENCOUNTER — Other Ambulatory Visit: Payer: Self-pay | Admitting: Urology

## 2018-01-13 ENCOUNTER — Inpatient Hospital Stay (HOSPITAL_COMMUNITY): Payer: 59

## 2018-01-13 ENCOUNTER — Inpatient Hospital Stay (HOSPITAL_COMMUNITY): Payer: 59 | Admitting: Anesthesiology

## 2018-01-13 ENCOUNTER — Encounter (HOSPITAL_COMMUNITY)
Admission: RE | Disposition: A | Discharge status: Home Health | Payer: Self-pay | Source: Ambulatory Visit | Attending: Surgery

## 2018-01-13 ENCOUNTER — Other Ambulatory Visit: Payer: Self-pay

## 2018-01-13 ENCOUNTER — Encounter (HOSPITAL_COMMUNITY): Payer: Self-pay | Admitting: Registered Nurse

## 2018-01-13 ENCOUNTER — Inpatient Hospital Stay (HOSPITAL_COMMUNITY)
Admission: RE | Admit: 2018-01-13 | Discharge: 2018-01-25 | DRG: 330 | Disposition: A | Discharge status: Home Health | Payer: 59 | Source: Ambulatory Visit | Attending: Surgery | Admitting: Surgery

## 2018-01-13 DIAGNOSIS — I5032 Chronic diastolic (congestive) heart failure: Secondary | ICD-10-CM | POA: Diagnosis present

## 2018-01-13 DIAGNOSIS — F17290 Nicotine dependence, other tobacco product, uncomplicated: Secondary | ICD-10-CM | POA: Diagnosis present

## 2018-01-13 DIAGNOSIS — E7800 Pure hypercholesterolemia, unspecified: Secondary | ICD-10-CM | POA: Diagnosis present

## 2018-01-13 DIAGNOSIS — Z8249 Family history of ischemic heart disease and other diseases of the circulatory system: Secondary | ICD-10-CM

## 2018-01-13 DIAGNOSIS — Z833 Family history of diabetes mellitus: Secondary | ICD-10-CM

## 2018-01-13 DIAGNOSIS — R0989 Other specified symptoms and signs involving the circulatory and respiratory systems: Secondary | ICD-10-CM

## 2018-01-13 DIAGNOSIS — Z7902 Long term (current) use of antithrombotics/antiplatelets: Secondary | ICD-10-CM

## 2018-01-13 DIAGNOSIS — Z955 Presence of coronary angioplasty implant and graft: Secondary | ICD-10-CM | POA: Diagnosis not present

## 2018-01-13 DIAGNOSIS — Z86711 Personal history of pulmonary embolism: Secondary | ICD-10-CM

## 2018-01-13 DIAGNOSIS — K219 Gastro-esophageal reflux disease without esophagitis: Secondary | ICD-10-CM | POA: Diagnosis not present

## 2018-01-13 DIAGNOSIS — K760 Fatty (change of) liver, not elsewhere classified: Secondary | ICD-10-CM | POA: Diagnosis present

## 2018-01-13 DIAGNOSIS — Z79899 Other long term (current) drug therapy: Secondary | ICD-10-CM | POA: Diagnosis not present

## 2018-01-13 DIAGNOSIS — K5720 Diverticulitis of large intestine with perforation and abscess without bleeding: Secondary | ICD-10-CM | POA: Diagnosis not present

## 2018-01-13 DIAGNOSIS — J449 Chronic obstructive pulmonary disease, unspecified: Secondary | ICD-10-CM | POA: Diagnosis present

## 2018-01-13 DIAGNOSIS — Z794 Long term (current) use of insulin: Secondary | ICD-10-CM | POA: Diagnosis not present

## 2018-01-13 DIAGNOSIS — R0602 Shortness of breath: Secondary | ICD-10-CM | POA: Diagnosis not present

## 2018-01-13 DIAGNOSIS — Z89511 Acquired absence of right leg below knee: Secondary | ICD-10-CM | POA: Diagnosis not present

## 2018-01-13 DIAGNOSIS — Z6832 Body mass index (BMI) 32.0-32.9, adult: Secondary | ICD-10-CM

## 2018-01-13 DIAGNOSIS — Z800 Family history of malignant neoplasm of digestive organs: Secondary | ICD-10-CM

## 2018-01-13 DIAGNOSIS — R109 Unspecified abdominal pain: Secondary | ICD-10-CM | POA: Diagnosis not present

## 2018-01-13 DIAGNOSIS — E1122 Type 2 diabetes mellitus with diabetic chronic kidney disease: Secondary | ICD-10-CM | POA: Diagnosis present

## 2018-01-13 DIAGNOSIS — Z4682 Encounter for fitting and adjustment of non-vascular catheter: Secondary | ICD-10-CM | POA: Diagnosis not present

## 2018-01-13 DIAGNOSIS — I2510 Atherosclerotic heart disease of native coronary artery without angina pectoris: Secondary | ICD-10-CM | POA: Diagnosis present

## 2018-01-13 DIAGNOSIS — N183 Chronic kidney disease, stage 3 (moderate): Secondary | ICD-10-CM | POA: Diagnosis present

## 2018-01-13 DIAGNOSIS — E785 Hyperlipidemia, unspecified: Secondary | ICD-10-CM | POA: Diagnosis present

## 2018-01-13 DIAGNOSIS — G4733 Obstructive sleep apnea (adult) (pediatric): Secondary | ICD-10-CM | POA: Diagnosis present

## 2018-01-13 DIAGNOSIS — Z408 Encounter for other prophylactic surgery: Secondary | ICD-10-CM | POA: Diagnosis not present

## 2018-01-13 DIAGNOSIS — E11618 Type 2 diabetes mellitus with other diabetic arthropathy: Secondary | ICD-10-CM | POA: Diagnosis not present

## 2018-01-13 DIAGNOSIS — K567 Ileus, unspecified: Secondary | ICD-10-CM | POA: Diagnosis not present

## 2018-01-13 DIAGNOSIS — K632 Fistula of intestine: Secondary | ICD-10-CM | POA: Diagnosis present

## 2018-01-13 DIAGNOSIS — I130 Hypertensive heart and chronic kidney disease with heart failure and stage 1 through stage 4 chronic kidney disease, or unspecified chronic kidney disease: Secondary | ICD-10-CM | POA: Diagnosis present

## 2018-01-13 DIAGNOSIS — Z932 Ileostomy status: Secondary | ICD-10-CM | POA: Diagnosis not present

## 2018-01-13 DIAGNOSIS — E1151 Type 2 diabetes mellitus with diabetic peripheral angiopathy without gangrene: Secondary | ICD-10-CM | POA: Diagnosis present

## 2018-01-13 DIAGNOSIS — Z0189 Encounter for other specified special examinations: Secondary | ICD-10-CM

## 2018-01-13 DIAGNOSIS — E876 Hypokalemia: Secondary | ICD-10-CM | POA: Diagnosis not present

## 2018-01-13 DIAGNOSIS — Z7982 Long term (current) use of aspirin: Secondary | ICD-10-CM

## 2018-01-13 DIAGNOSIS — K5792 Diverticulitis of intestine, part unspecified, without perforation or abscess without bleeding: Secondary | ICD-10-CM | POA: Diagnosis not present

## 2018-01-13 DIAGNOSIS — K641 Second degree hemorrhoids: Secondary | ICD-10-CM | POA: Diagnosis not present

## 2018-01-13 DIAGNOSIS — Z4781 Encounter for orthopedic aftercare following surgical amputation: Secondary | ICD-10-CM | POA: Diagnosis not present

## 2018-01-13 DIAGNOSIS — N179 Acute kidney failure, unspecified: Secondary | ICD-10-CM | POA: Diagnosis present

## 2018-01-13 DIAGNOSIS — Z4659 Encounter for fitting and adjustment of other gastrointestinal appliance and device: Secondary | ICD-10-CM

## 2018-01-13 DIAGNOSIS — N400 Enlarged prostate without lower urinary tract symptoms: Secondary | ICD-10-CM | POA: Diagnosis present

## 2018-01-13 HISTORY — PX: CYSTOSCOPY WITH STENT PLACEMENT: SHX5790

## 2018-01-13 HISTORY — PX: ILEOSTOMY: SHX1783

## 2018-01-13 HISTORY — PX: PARTIAL COLECTOMY: SHX5273

## 2018-01-13 LAB — GLUCOSE, CAPILLARY
Glucose-Capillary: 120 mg/dL — ABNORMAL HIGH (ref 65–99)
Glucose-Capillary: 140 mg/dL — ABNORMAL HIGH (ref 65–99)
Glucose-Capillary: 161 mg/dL — ABNORMAL HIGH (ref 65–99)
Glucose-Capillary: 197 mg/dL — ABNORMAL HIGH (ref 65–99)
Glucose-Capillary: 212 mg/dL — ABNORMAL HIGH (ref 65–99)

## 2018-01-13 LAB — TYPE AND SCREEN
ABO/RH(D): AB NEG
Antibody Screen: NEGATIVE

## 2018-01-13 SURGERY — COLECTOMY, PARTIAL
Anesthesia: General | Site: Urethra

## 2018-01-13 MED ORDER — INSULIN ASPART 100 UNIT/ML ~~LOC~~ SOLN
0.0000 [IU] | SUBCUTANEOUS | Status: DC
  Administered 2018-01-13: 5 [IU] via SUBCUTANEOUS
  Administered 2018-01-14 – 2018-01-16 (×7): 2 [IU] via SUBCUTANEOUS

## 2018-01-13 MED ORDER — DEXAMETHASONE SODIUM PHOSPHATE 10 MG/ML IJ SOLN
INTRAMUSCULAR | Status: AC
  Filled 2018-01-13: qty 1

## 2018-01-13 MED ORDER — OXYCODONE HCL 5 MG PO TABS
5.0000 mg | ORAL_TABLET | ORAL | Status: DC | PRN
  Administered 2018-01-14 – 2018-01-16 (×6): 10 mg via ORAL
  Filled 2018-01-13 (×6): qty 2

## 2018-01-13 MED ORDER — DIPHENHYDRAMINE HCL 12.5 MG/5ML PO ELIX: 12.5000 mg | ORAL_SOLUTION | Freq: Four times a day (QID) | ORAL | Status: DC | PRN

## 2018-01-13 MED ORDER — ONDANSETRON HCL 4 MG/2ML IJ SOLN
INTRAMUSCULAR | Status: AC
  Filled 2018-01-13: qty 2

## 2018-01-13 MED ORDER — IOHEXOL 300 MG/ML  SOLN
INTRAMUSCULAR | Status: DC | PRN
  Administered 2018-01-13: 50 mL via INTRAVENOUS

## 2018-01-13 MED ORDER — SUGAMMADEX SODIUM 500 MG/5ML IV SOLN
INTRAVENOUS | Status: DC | PRN
  Administered 2018-01-13: 250 mg via INTRAVENOUS

## 2018-01-13 MED ORDER — LACTATED RINGERS IV SOLN
INTRAVENOUS | Status: DC
  Administered 2018-01-13 (×3): via INTRAVENOUS

## 2018-01-13 MED ORDER — EPHEDRINE 5 MG/ML INJ
INTRAVENOUS | Status: AC
  Filled 2018-01-13: qty 10

## 2018-01-13 MED ORDER — ONDANSETRON HCL 4 MG PO TABS: 4.0000 mg | ORAL_TABLET | Freq: Four times a day (QID) | ORAL | Status: DC | PRN

## 2018-01-13 MED ORDER — CELECOXIB 200 MG PO CAPS: 200.0000 mg | ORAL_CAPSULE | Freq: Two times a day (BID) | ORAL | Status: DC

## 2018-01-13 MED ORDER — SACCHAROMYCES BOULARDII 250 MG PO CAPS
250.0000 mg | ORAL_CAPSULE | Freq: Two times a day (BID) | ORAL | Status: DC
  Administered 2018-01-13 – 2018-01-18 (×10): 250 mg via ORAL
  Filled 2018-01-13 (×10): qty 1

## 2018-01-13 MED ORDER — ONDANSETRON HCL 4 MG/2ML IJ SOLN
4.0000 mg | Freq: Four times a day (QID) | INTRAMUSCULAR | Status: DC | PRN
  Administered 2018-01-14 – 2018-01-19 (×10): 4 mg via INTRAVENOUS
  Filled 2018-01-13 (×10): qty 2

## 2018-01-13 MED ORDER — POLYETHYLENE GLYCOL 3350 17 GM/SCOOP PO POWD: 1.0000 | Freq: Once | ORAL | Status: DC

## 2018-01-13 MED ORDER — LACTATED RINGERS IR SOLN
Status: DC | PRN
  Administered 2018-01-13: 1000 mL

## 2018-01-13 MED ORDER — GABAPENTIN 300 MG PO CAPS
300.0000 mg | ORAL_CAPSULE | ORAL | Status: AC
  Administered 2018-01-13: 300 mg via ORAL
  Filled 2018-01-13: qty 1

## 2018-01-13 MED ORDER — FENTANYL CITRATE (PF) 250 MCG/5ML IJ SOLN
INTRAMUSCULAR | Status: DC | PRN
  Administered 2018-01-13 (×4): 50 ug via INTRAVENOUS
  Administered 2018-01-13: 100 ug via INTRAVENOUS

## 2018-01-13 MED ORDER — FENTANYL CITRATE (PF) 100 MCG/2ML IJ SOLN
INTRAMUSCULAR | Status: AC
  Filled 2018-01-13: qty 2

## 2018-01-13 MED ORDER — FENTANYL 50 MCG/HR TD PT72
50.0000 ug | MEDICATED_PATCH | TRANSDERMAL | Status: DC
  Administered 2018-01-14 – 2018-01-23 (×4): 50 ug via TRANSDERMAL
  Filled 2018-01-13 (×4): qty 1

## 2018-01-13 MED ORDER — LIDOCAINE HCL 2 % IJ SOLN
INTRAMUSCULAR | Status: AC
  Filled 2018-01-13: qty 20

## 2018-01-13 MED ORDER — HEPARIN SODIUM (PORCINE) 5000 UNIT/ML IJ SOLN
5000.0000 [IU] | Freq: Once | INTRAMUSCULAR | Status: AC
  Administered 2018-01-13: 5000 [IU] via SUBCUTANEOUS
  Filled 2018-01-13: qty 1

## 2018-01-13 MED ORDER — TAMSULOSIN HCL 0.4 MG PO CAPS
0.4000 mg | ORAL_CAPSULE | Freq: Every day | ORAL | Status: DC
  Administered 2018-01-14 – 2018-01-18 (×5): 0.4 mg via ORAL
  Filled 2018-01-13 (×5): qty 1

## 2018-01-13 MED ORDER — DEXMEDETOMIDINE HCL IN NACL 200 MCG/50ML IV SOLN
INTRAVENOUS | Status: AC
  Filled 2018-01-13: qty 50

## 2018-01-13 MED ORDER — ACETAMINOPHEN 325 MG PO TABS: 325.0000 mg | ORAL_TABLET | ORAL | Status: DC | PRN

## 2018-01-13 MED ORDER — ALVIMOPAN 12 MG PO CAPS
12.0000 mg | ORAL_CAPSULE | ORAL | Status: AC
  Administered 2018-01-13: 12 mg via ORAL
  Filled 2018-01-13: qty 1

## 2018-01-13 MED ORDER — FENTANYL CITRATE (PF) 250 MCG/5ML IJ SOLN
INTRAMUSCULAR | Status: AC
Start: 2018-01-13 — End: 2018-01-13
  Filled 2018-01-13: qty 5

## 2018-01-13 MED ORDER — BUPIVACAINE HCL (PF) 0.25 % IJ SOLN
INTRAMUSCULAR | Status: AC
  Filled 2018-01-13: qty 30

## 2018-01-13 MED ORDER — KETAMINE HCL 10 MG/ML IJ SOLN
INTRAMUSCULAR | Status: DC | PRN
  Administered 2018-01-13: 25 mg via INTRAVENOUS
  Administered 2018-01-13: 40 mg via INTRAVENOUS
  Administered 2018-01-13: 25 mg via INTRAVENOUS

## 2018-01-13 MED ORDER — SODIUM CHLORIDE 0.9 % IV SOLN
INTRAVENOUS | Status: DC
  Administered 2018-01-13 – 2018-01-14 (×2): via INTRAVENOUS
  Administered 2018-01-14 – 2018-01-15 (×2): 1000 mL via INTRAVENOUS
  Administered 2018-01-15 – 2018-01-16 (×2): via INTRAVENOUS
  Filled 2018-01-13: qty 1000

## 2018-01-13 MED ORDER — OXYCODONE HCL 5 MG/5ML PO SOLN
5.0000 mg | Freq: Once | ORAL | Status: DC | PRN
  Filled 2018-01-13: qty 5

## 2018-01-13 MED ORDER — MIDAZOLAM HCL 2 MG/2ML IJ SOLN
INTRAMUSCULAR | Status: AC
  Filled 2018-01-13: qty 2

## 2018-01-13 MED ORDER — CHLORHEXIDINE GLUCONATE 4 % EX LIQD: 60.0000 mL | Freq: Once | CUTANEOUS | Status: DC

## 2018-01-13 MED ORDER — METRONIDAZOLE 500 MG PO TABS: 1000.0000 mg | ORAL_TABLET | ORAL | Status: DC

## 2018-01-13 MED ORDER — BUPIVACAINE LIPOSOME 1.3 % IJ SUSP
20.0000 mL | Freq: Once | INTRAMUSCULAR | Status: DC
  Filled 2018-01-13: qty 20

## 2018-01-13 MED ORDER — MIDAZOLAM HCL 5 MG/5ML IJ SOLN
INTRAMUSCULAR | Status: DC | PRN
  Administered 2018-01-13: 2 mg via INTRAVENOUS

## 2018-01-13 MED ORDER — 0.9 % SODIUM CHLORIDE (POUR BTL) OPTIME
TOPICAL | Status: DC | PRN
  Administered 2018-01-13: 4000 mL

## 2018-01-13 MED ORDER — LIDOCAINE 2% (20 MG/ML) 5 ML SYRINGE
INTRAMUSCULAR | Status: AC
Start: 2018-01-13 — End: 2018-01-13
  Filled 2018-01-13: qty 5

## 2018-01-13 MED ORDER — HYDROMORPHONE HCL 1 MG/ML IJ SOLN
0.5000 mg | INTRAMUSCULAR | Status: DC | PRN
  Administered 2018-01-13 – 2018-01-17 (×11): 1 mg via INTRAVENOUS
  Filled 2018-01-13 (×12): qty 1

## 2018-01-13 MED ORDER — ALVIMOPAN 12 MG PO CAPS
12.0000 mg | ORAL_CAPSULE | Freq: Two times a day (BID) | ORAL | Status: DC
  Administered 2018-01-14 (×2): 12 mg via ORAL
  Filled 2018-01-13 (×2): qty 1

## 2018-01-13 MED ORDER — BISACODYL 5 MG PO TBEC
20.0000 mg | DELAYED_RELEASE_TABLET | Freq: Once | ORAL | Status: DC
  Filled 2018-01-13: qty 4

## 2018-01-13 MED ORDER — METOPROLOL TARTRATE 5 MG/5ML IV SOLN: 5.0000 mg | Freq: Four times a day (QID) | INTRAVENOUS | Status: DC | PRN

## 2018-01-13 MED ORDER — ONDANSETRON HCL 4 MG/2ML IJ SOLN
INTRAMUSCULAR | Status: DC | PRN
  Administered 2018-01-13: 4 mg via INTRAVENOUS

## 2018-01-13 MED ORDER — GABAPENTIN 300 MG PO CAPS
300.0000 mg | ORAL_CAPSULE | Freq: Two times a day (BID) | ORAL | Status: DC
  Administered 2018-01-13: 300 mg via ORAL
  Filled 2018-01-13: qty 1

## 2018-01-13 MED ORDER — ACETAMINOPHEN 500 MG PO TABS
1000.0000 mg | ORAL_TABLET | Freq: Four times a day (QID) | ORAL | Status: AC
  Administered 2018-01-13 – 2018-01-14 (×3): 1000 mg via ORAL
  Filled 2018-01-13 (×3): qty 2

## 2018-01-13 MED ORDER — CARVEDILOL 25 MG PO TABS
37.5000 mg | ORAL_TABLET | Freq: Two times a day (BID) | ORAL | Status: DC
  Administered 2018-01-14 – 2018-01-18 (×9): 37.5 mg via ORAL
  Filled 2018-01-13 (×9): qty 1

## 2018-01-13 MED ORDER — AMLODIPINE-OLMESARTAN 5-20 MG PO TABS: 2.0000 | ORAL_TABLET | Freq: Every evening | ORAL | Status: DC

## 2018-01-13 MED ORDER — HYDRALAZINE HCL 20 MG/ML IJ SOLN
10.0000 mg | INTRAMUSCULAR | Status: AC | PRN
  Administered 2018-01-14 – 2018-01-20 (×4): 10 mg via INTRAVENOUS
  Filled 2018-01-13 (×4): qty 1

## 2018-01-13 MED ORDER — KETAMINE HCL 10 MG/ML IJ SOLN
INTRAMUSCULAR | Status: AC
  Filled 2018-01-13: qty 1

## 2018-01-13 MED ORDER — NEOMYCIN SULFATE 500 MG PO TABS: 1000.0000 mg | ORAL_TABLET | ORAL | Status: DC

## 2018-01-13 MED ORDER — ENOXAPARIN SODIUM 40 MG/0.4ML ~~LOC~~ SOLN
40.0000 mg | SUBCUTANEOUS | Status: DC
  Administered 2018-01-14 – 2018-01-25 (×12): 40 mg via SUBCUTANEOUS
  Filled 2018-01-13 (×12): qty 0.4

## 2018-01-13 MED ORDER — LIDOCAINE 2% (20 MG/ML) 5 ML SYRINGE
INTRAMUSCULAR | Status: DC | PRN
  Administered 2018-01-13: 1.5 mg/kg/h via INTRAVENOUS

## 2018-01-13 MED ORDER — ROPINIROLE HCL 1 MG PO TABS
2.0000 mg | ORAL_TABLET | Freq: Every day | ORAL | Status: DC
  Administered 2018-01-13 – 2018-01-17 (×5): 2 mg via ORAL
  Filled 2018-01-13 (×5): qty 2

## 2018-01-13 MED ORDER — OXYCODONE HCL 5 MG PO TABS: 5.0000 mg | ORAL_TABLET | Freq: Once | ORAL | Status: DC | PRN

## 2018-01-13 MED ORDER — ACETAMINOPHEN 500 MG PO TABS
1000.0000 mg | ORAL_TABLET | ORAL | Status: AC
  Administered 2018-01-13: 1000 mg via ORAL
  Filled 2018-01-13: qty 2

## 2018-01-13 MED ORDER — ROCURONIUM BROMIDE 10 MG/ML (PF) SYRINGE
PREFILLED_SYRINGE | INTRAVENOUS | Status: AC
Start: 2018-01-13 — End: 2018-01-13
  Filled 2018-01-13: qty 5

## 2018-01-13 MED ORDER — STERILE WATER FOR IRRIGATION IR SOLN
Status: DC | PRN
  Administered 2018-01-13: 500 mL

## 2018-01-13 MED ORDER — ACETAMINOPHEN 160 MG/5ML PO SOLN: 325.0000 mg | ORAL | Status: DC | PRN

## 2018-01-13 MED ORDER — ASPIRIN EC 81 MG PO TBEC
81.0000 mg | DELAYED_RELEASE_TABLET | Freq: Every day | ORAL | Status: DC
  Administered 2018-01-14 – 2018-01-18 (×5): 81 mg via ORAL
  Filled 2018-01-13 (×5): qty 1

## 2018-01-13 MED ORDER — EPHEDRINE SULFATE-NACL 50-0.9 MG/10ML-% IV SOSY
PREFILLED_SYRINGE | INTRAVENOUS | Status: DC | PRN
  Administered 2018-01-13 (×3): 10 mg via INTRAVENOUS

## 2018-01-13 MED ORDER — CELECOXIB 200 MG PO CAPS
200.0000 mg | ORAL_CAPSULE | ORAL | Status: AC
  Administered 2018-01-13: 200 mg via ORAL
  Filled 2018-01-13: qty 1

## 2018-01-13 MED ORDER — ACETAMINOPHEN 325 MG PO TABS
650.0000 mg | ORAL_TABLET | Freq: Four times a day (QID) | ORAL | Status: DC | PRN
  Administered 2018-01-13: 650 mg via ORAL
  Filled 2018-01-13: qty 2

## 2018-01-13 MED ORDER — ROCURONIUM BROMIDE 10 MG/ML (PF) SYRINGE
PREFILLED_SYRINGE | INTRAVENOUS | Status: DC | PRN
  Administered 2018-01-13 (×2): 20 mg via INTRAVENOUS
  Administered 2018-01-13: 70 mg via INTRAVENOUS
  Administered 2018-01-13: 30 mg via INTRAVENOUS
  Administered 2018-01-13: 10 mg via INTRAVENOUS

## 2018-01-13 MED ORDER — PHENYLEPHRINE 40 MCG/ML (10ML) SYRINGE FOR IV PUSH (FOR BLOOD PRESSURE SUPPORT)
PREFILLED_SYRINGE | INTRAVENOUS | Status: AC
  Filled 2018-01-13: qty 10

## 2018-01-13 MED ORDER — ONDANSETRON HCL 4 MG/2ML IJ SOLN: 4.0000 mg | Freq: Once | INTRAMUSCULAR | Status: DC | PRN

## 2018-01-13 MED ORDER — DIPHENHYDRAMINE HCL 50 MG/ML IJ SOLN
12.5000 mg | Freq: Four times a day (QID) | INTRAMUSCULAR | Status: DC | PRN
  Filled 2018-01-13: qty 1

## 2018-01-13 MED ORDER — ROCURONIUM BROMIDE 10 MG/ML (PF) SYRINGE
PREFILLED_SYRINGE | INTRAVENOUS | Status: AC
  Filled 2018-01-13: qty 5

## 2018-01-13 MED ORDER — ALUM & MAG HYDROXIDE-SIMETH 200-200-20 MG/5ML PO SUSP
30.0000 mL | Freq: Four times a day (QID) | ORAL | Status: DC | PRN
  Administered 2018-01-14 – 2018-01-17 (×4): 30 mL via ORAL
  Filled 2018-01-13 (×4): qty 30

## 2018-01-13 MED ORDER — CARVEDILOL 25 MG PO TABS: 37.5000 mg | ORAL_TABLET | Freq: Two times a day (BID) | ORAL | Status: DC

## 2018-01-13 MED ORDER — LIDOCAINE 2% (20 MG/ML) 5 ML SYRINGE
INTRAMUSCULAR | Status: DC | PRN
  Administered 2018-01-13: 100 mg via INTRAVENOUS

## 2018-01-13 MED ORDER — SUGAMMADEX SODIUM 500 MG/5ML IV SOLN
INTRAVENOUS | Status: AC
  Filled 2018-01-13: qty 5

## 2018-01-13 MED ORDER — CEFOTETAN DISODIUM-DEXTROSE 2-2.08 GM-%(50ML) IV SOLR
2.0000 g | INTRAVENOUS | Status: AC
  Administered 2018-01-13: 2 g via INTRAVENOUS
  Filled 2018-01-13: qty 50

## 2018-01-13 MED ORDER — PROPOFOL 10 MG/ML IV BOLUS
INTRAVENOUS | Status: DC | PRN
  Administered 2018-01-13: 170 mg via INTRAVENOUS

## 2018-01-13 MED ORDER — BUPIVACAINE LIPOSOME 1.3 % IJ SUSP
INTRAMUSCULAR | Status: DC | PRN
  Administered 2018-01-13: 20 mL

## 2018-01-13 MED ORDER — PANTOPRAZOLE SODIUM 40 MG PO TBEC
40.0000 mg | DELAYED_RELEASE_TABLET | Freq: Every day | ORAL | Status: DC
  Administered 2018-01-14 – 2018-01-18 (×5): 40 mg via ORAL
  Filled 2018-01-13 (×5): qty 1

## 2018-01-13 MED ORDER — DEXMEDETOMIDINE HCL IN NACL 200 MCG/50ML IV SOLN
INTRAVENOUS | Status: DC | PRN
  Administered 2018-01-13: 12 ug via INTRAVENOUS
  Administered 2018-01-13: 4 ug via INTRAVENOUS
  Administered 2018-01-13 (×2): 12 ug via INTRAVENOUS

## 2018-01-13 MED ORDER — PHENYLEPHRINE 40 MCG/ML (10ML) SYRINGE FOR IV PUSH (FOR BLOOD PRESSURE SUPPORT)
PREFILLED_SYRINGE | INTRAVENOUS | Status: DC | PRN
  Administered 2018-01-13: 80 ug via INTRAVENOUS

## 2018-01-13 MED ORDER — PROPOFOL 10 MG/ML IV BOLUS
INTRAVENOUS | Status: AC
  Filled 2018-01-13: qty 20

## 2018-01-13 MED ORDER — SODIUM CHLORIDE 0.9 % IV SOLN
2.0000 g | Freq: Two times a day (BID) | INTRAVENOUS | Status: AC
  Administered 2018-01-14 (×2): 2 g via INTRAVENOUS
  Filled 2018-01-13 (×2): qty 2

## 2018-01-13 MED ORDER — ISOSORBIDE MONONITRATE ER 60 MG PO TB24
120.0000 mg | ORAL_TABLET | Freq: Every day | ORAL | Status: DC
  Administered 2018-01-14 – 2018-01-18 (×5): 120 mg via ORAL
  Filled 2018-01-13 (×6): qty 2

## 2018-01-13 MED ORDER — FENTANYL CITRATE (PF) 100 MCG/2ML IJ SOLN
25.0000 ug | INTRAMUSCULAR | Status: DC | PRN
  Administered 2018-01-13 (×2): 50 ug via INTRAVENOUS

## 2018-01-13 MED ORDER — METOCLOPRAMIDE HCL 5 MG/ML IJ SOLN
10.0000 mg | Freq: Four times a day (QID) | INTRAMUSCULAR | Status: DC | PRN
  Administered 2018-01-19: 10 mg via INTRAVENOUS
  Filled 2018-01-13: qty 2

## 2018-01-13 MED ORDER — MEPERIDINE HCL 50 MG/ML IJ SOLN: 6.2500 mg | INTRAMUSCULAR | Status: DC | PRN

## 2018-01-13 MED ORDER — DEXAMETHASONE SODIUM PHOSPHATE 10 MG/ML IJ SOLN
INTRAMUSCULAR | Status: DC | PRN
  Administered 2018-01-13: 8 mg via INTRAVENOUS

## 2018-01-13 MED ORDER — BUPIVACAINE HCL 0.25 % IJ SOLN
INTRAMUSCULAR | Status: DC | PRN
  Administered 2018-01-13: 30 mL

## 2018-01-13 SURGICAL SUPPLY — 89 items
ADH SKN CLS APL DERMABOND .7 (GAUZE/BANDAGES/DRESSINGS)
APPLIER CLIP 5 13 M/L LIGAMAX5 (MISCELLANEOUS)
APPLIER CLIP ROT 10 11.4 M/L (STAPLE)
APR CLP MED LRG 11.4X10 (STAPLE)
APR CLP MED LRG 5 ANG JAW (MISCELLANEOUS)
BAG URO CATCHER STRL LF (MISCELLANEOUS) ×5 IMPLANT
BLADE EXTENDED COATED 6.5IN (ELECTRODE) IMPLANT
BRR ADH 5X3 SEPRAFILM 6 SHT (MISCELLANEOUS) ×4
CABLE HIGH FREQUENCY MONO STRZ (ELECTRODE) ×5 IMPLANT
CATH ROBINSON RED A/P 18FR (CATHETERS) ×2 IMPLANT
CATH URET 5FR 28IN OPEN ENDED (CATHETERS) ×7 IMPLANT
CELLS DAT CNTRL 66122 CELL SVR (MISCELLANEOUS) IMPLANT
CLIP APPLIE 5 13 M/L LIGAMAX5 (MISCELLANEOUS) IMPLANT
CLIP APPLIE ROT 10 11.4 M/L (STAPLE) IMPLANT
CLOTH BEACON ORANGE TIMEOUT ST (SAFETY) ×5 IMPLANT
COVER FOOTSWITCH UNIV (MISCELLANEOUS) IMPLANT
COVER SURGICAL LIGHT HANDLE (MISCELLANEOUS) ×13 IMPLANT
DECANTER SPIKE VIAL GLASS SM (MISCELLANEOUS) ×3 IMPLANT
DERMABOND ADVANCED (GAUZE/BANDAGES/DRESSINGS)
DERMABOND ADVANCED .7 DNX12 (GAUZE/BANDAGES/DRESSINGS) IMPLANT
DRAIN CHANNEL 19F RND (DRAIN) IMPLANT
DRSG OPSITE POSTOP 4X10 (GAUZE/BANDAGES/DRESSINGS) IMPLANT
DRSG OPSITE POSTOP 4X6 (GAUZE/BANDAGES/DRESSINGS) IMPLANT
DRSG OPSITE POSTOP 4X8 (GAUZE/BANDAGES/DRESSINGS) IMPLANT
ELECT PENCIL ROCKER SW 15FT (MISCELLANEOUS) ×5 IMPLANT
ELECT REM PT RETURN 15FT ADLT (MISCELLANEOUS) ×5 IMPLANT
ENDOLOOP SUT PDS II  0 18 (SUTURE)
ENDOLOOP SUT PDS II 0 18 (SUTURE) IMPLANT
GAUZE SPONGE 4X4 12PLY STRL (GAUZE/BANDAGES/DRESSINGS) IMPLANT
GLOVE BIO SURGEON STRL SZ 6 (GLOVE) ×10 IMPLANT
GLOVE BIOGEL M STRL SZ7.5 (GLOVE) ×5 IMPLANT
GLOVE INDICATOR 6.5 STRL GRN (GLOVE) ×10 IMPLANT
GOWN STRL REUS W/TWL LRG LVL3 (GOWN DISPOSABLE) ×20 IMPLANT
GOWN STRL REUS W/TWL XL LVL3 (GOWN DISPOSABLE) ×22 IMPLANT
GUIDEWIRE STR DUAL SENSOR (WIRE) ×5 IMPLANT
KIT PREVENA INCISION MGT20CM45 (CANNISTER) ×2 IMPLANT
LIGASURE IMPACT 36 18CM CVD LR (INSTRUMENTS) ×2 IMPLANT
MANIFOLD NEPTUNE II (INSTRUMENTS) ×7 IMPLANT
PACK COLON (CUSTOM PROCEDURE TRAY) ×5 IMPLANT
PACK CYSTO (CUSTOM PROCEDURE TRAY) ×5 IMPLANT
PAD POSITIONING PINK XL (MISCELLANEOUS) ×2 IMPLANT
PORT LAP GEL ALEXIS MED 5-9CM (MISCELLANEOUS) IMPLANT
POUCH DRAINABLE 1PC 2 1/4 FLAT (OSTOMY) ×2 IMPLANT
RELOAD PROXIMATE 75MM BLUE (ENDOMECHANICALS) ×5 IMPLANT
RELOAD STAPLE 60 2.6 WHT THN (STAPLE) IMPLANT
RELOAD STAPLE 75 3.8 BLU REG (ENDOMECHANICALS) IMPLANT
RELOAD STAPLER WHITE 60MM (STAPLE) IMPLANT
RETRACTOR WND ALEXIS 18 MED (MISCELLANEOUS) IMPLANT
RETRACTOR WND ALEXIS 25 LRG (MISCELLANEOUS) ×1 IMPLANT
RTRCTR WOUND ALEXIS 18CM MED (MISCELLANEOUS)
RTRCTR WOUND ALEXIS 25CM LRG (MISCELLANEOUS) ×5
SCISSORS LAP 5X35 DISP (ENDOMECHANICALS) ×5 IMPLANT
SEPRAFILM PROCEDURAL PACK 3X5 (MISCELLANEOUS) ×2 IMPLANT
SET IRRIG TUBING LAPAROSCOPIC (IRRIGATION / IRRIGATOR) ×5 IMPLANT
SHEARS HARMONIC ACE PLUS 36CM (ENDOMECHANICALS) ×5 IMPLANT
SLEEVE XCEL OPT CAN 5 100 (ENDOMECHANICALS) ×14 IMPLANT
SPONGE DRAIN TRACH 4X4 STRL 2S (GAUZE/BANDAGES/DRESSINGS) ×2 IMPLANT
SPONGE LAP 18X18 RF (DISPOSABLE) IMPLANT
STAPLER ECHELON LONG 60 440 (INSTRUMENTS) IMPLANT
STAPLER PROXIMATE 75MM BLUE (STAPLE) ×2 IMPLANT
STAPLER RELOAD WHITE 60MM (STAPLE)
STAPLER TA28 THK CONTR EEA XL (STAPLE) ×2 IMPLANT
STAPLER VISISTAT 35W (STAPLE) IMPLANT
SUT ETHILON 2 0 PS N (SUTURE) ×4 IMPLANT
SUT PDS AB 0 CT1 36 (SUTURE) IMPLANT
SUT PDS AB 1 CTX 36 (SUTURE) IMPLANT
SUT PDS AB 1 TP1 96 (SUTURE) ×4 IMPLANT
SUT PROLENE 2 0 KS (SUTURE) ×2 IMPLANT
SUT PROLENE 2 0 SH DA (SUTURE) IMPLANT
SUT SILK 2 0 (SUTURE) ×5
SUT SILK 2 0 SH CR/8 (SUTURE) ×5 IMPLANT
SUT SILK 2-0 18XBRD TIE 12 (SUTURE) ×4 IMPLANT
SUT SILK 3 0 (SUTURE) ×5
SUT SILK 3 0 SH CR/8 (SUTURE) ×5 IMPLANT
SUT SILK 3-0 18XBRD TIE 12 (SUTURE) ×4 IMPLANT
SUT VIC AB 2-0 SH 27 (SUTURE)
SUT VIC AB 2-0 SH 27X BRD (SUTURE) IMPLANT
SUT VIC AB 3-0 SH 18 (SUTURE) ×2 IMPLANT
SUT VIC AB 3-0 SH 27 (SUTURE)
SUT VIC AB 3-0 SH 27XBRD (SUTURE) IMPLANT
SYS LAPSCP GELPORT 120MM (MISCELLANEOUS)
SYSTEM LAPSCP GELPORT 120MM (MISCELLANEOUS) IMPLANT
TAPE CLOTH SURG 4X10 WHT LF (GAUZE/BANDAGES/DRESSINGS) ×2 IMPLANT
TOWEL OR NON WOVEN STRL DISP B (DISPOSABLE) ×5 IMPLANT
TRAY FOLEY W/METER SILVER 16FR (SET/KITS/TRAYS/PACK) IMPLANT
TROCAR BLADELESS OPT 5 100 (ENDOMECHANICALS) ×5 IMPLANT
TROCAR XCEL 12X100 BLDLESS (ENDOMECHANICALS) IMPLANT
TUBING CONNECTING 10 (TUBING) ×12 IMPLANT
TUBING INSUF HEATED (TUBING) ×5 IMPLANT

## 2018-01-13 NOTE — Op Note (Signed)
Preoperative diagnosis:  1. Pelvic Abscess  2.   Postoperative diagnosis:  1. Same   Procedure: 1. Cystoscopy, retrograde pyelogram with interpretation 2. Bilateral temporary ureteral stent placement   Surgeon: Ardis Hughs, MD   Anesthesia: General   Complications: None   Intraoperative findings:   #1)  Left retrograde pyelogram was performed using 10cc of Omnipaque contrast using a 5 [place] open-ended ureteral catheter demonstrating a normal caliber ureter with no significant hydroureteronephrosis or filling defects. #2: Right retrograde pyelogram was performed using  10cc  Omnipaque contrast using a 5 [place] open-ended ureteral catheter demonstrating a normal caliber ureter with no significant filling defect or hydroureteronephrosis.  EBL: Minimal   Specimens: None   Indication: [name] is a 62 y.o.  patient with diverticular abscess colovesical fistula.  [name] requested temporary ureteral stents to help facilitate the dissection of the sigmoid colon. Patient with 3 piece IPP.   After reviewing the management options for treatment, he elected to proceed with the above surgical procedure(s). We have discussed the potential benefits and risks of the procedure, side effects of the proposed treatment, the likelihood of the patient achieving the goals of the procedure, and any potential problems that might occur during the procedure or recuperation. Informed consent has been obtained.   Description of procedure:   The patient was taken to the operating room and general anesthesia was induced.  The patient was placed in the dorsal lithotomy position, prepped and draped in the usual sterile fashion, and preoperative antibiotics were administered. A preoperative time-out was performed.    A 21 French 30 degree cystoscope was gently passed through the patient's urethra into the bladder.  The bladder was subsequently emptied and then filled slowly up performing a 360 degrees  cystoscopic evaluation.  This demonstrated orthotopic ureteral orifices, normal bladder mucosa with no evidence of colovesical fistula without mucosal abnormality.   I then advanced a 5 [place] open-ended ureteral catheter into the patient's left ureteral orifice and performed retrograde pyelogram with the above findings.  I then advanced the catheter up to the renal pelvis.  Subsequently turned my attention to the patient's right ureteral orifice and using a second open-ended catheter advanced it into the right ureteral orifice performing a retrograde pyelogram with the above findings.  I then advanced the stent up to the renal pelvis under fluoroscopic guidance.  The bladder was subsequently emptied and the stents were kept in place removing the scope over the stents.  I then  placed a 14 [place] Foley with a Freight forwarder - hoping to avoid any urethral pressure/erosion of his cylinders of the prosthesis.  The stents were then attached to the [facility] adapter and then tied to the Foley with a silk tie.    The surgery was then turned over to [name] for facilitation of the remainder of the case.

## 2018-01-13 NOTE — Transfer of Care (Signed)
Immediate Anesthesia Transfer of Care Note  Patient: [name]  Procedure(s) Performed: LAPAROSCOPIC ASSISTED   SIGMOID COLECTOMY ILEOSTOMY (N/A Abdomen) ILEOSTOMY (Abdomen) CYSTOSCOPY WITH BILATERAL STENT PLACEMENT (Bilateral Urethra)  Patient Location: PACU  Anesthesia Type:General  Level of Consciousness: sedated  Airway & Oxygen Therapy: Patient Spontanous Breathing and Patient connected to face mask oxygen  Post-op Assessment: Report given to RN and Post -op Vital signs reviewed and stable  Post vital signs: Reviewed and stable  Last Vitals:  Vitals Value Taken Time  BP 140/69 01/13/2018  4:00 PM  Temp    Pulse 65 01/13/2018  4:03 PM  Resp 19 01/13/2018  4:03 PM  SpO2 100 % 01/13/2018  4:03 PM  Vitals shown include unvalidated device data.  Last Pain:  Vitals:   01/13/18 1045  TempSrc:   PainSc: 8       Patients Stated Pain Goal: 5 (63/01/60 1093)  Complications: No apparent anesthesia complications

## 2018-01-13 NOTE — Anesthesia Procedure Notes (Addendum)
Procedure Name: Intubation Date/Time: 01/13/2018 11:50 AM Performed by: Talbot Grumbling, CRNA Pre-anesthesia Checklist: Patient identified, Emergency Drugs available, Suction available and Patient being monitored Patient Re-evaluated:Patient Re-evaluated prior to induction Oxygen Delivery Method: Circle system utilized Preoxygenation: Pre-oxygenation with 100% oxygen Induction Type: IV induction Ventilation: Mask ventilation without difficulty and Oral airway inserted - appropriate to patient size Laryngoscope Size: Miller and 3 Grade View: Grade II Tube type: Oral Tube size: 7.5 mm Number of attempts: 1 Airway Equipment and Method: Stylet Placement Confirmation: ETT inserted through vocal cords under direct vision,  positive ETCO2 and breath sounds checked- equal and bilateral Secured at: 23 cm Tube secured with: Tape Dental Injury: Teeth and Oropharynx as per pre-operative assessment

## 2018-01-13 NOTE — Anesthesia Preprocedure Evaluation (Signed)
Anesthesia Evaluation  Patient identified by MRN, date of birth, ID band Patient awake    Reviewed: Allergy & Precautions, NPO status , Patient's Chart, lab work & pertinent test results  Airway Mallampati: I  TM Distance: >3 FB Neck ROM: Full    Dental  (+) Edentulous Upper, Edentulous Lower   Pulmonary sleep apnea , Current Smoker, former smoker,    breath sounds clear to auscultation       Cardiovascular hypertension, Pt. on medications + angina with exertion + CAD, + Peripheral Vascular Disease and +CHF   Rhythm:Regular Rate:Normal  ECHO 18'  Left ventricle: The cavity size was normal. There was moderate concentric hypertrophy. Systolic function was normal. The estimated ejection fraction was in the range of 55% to 60%. Wall motion was normal; there were no regional wall motion abnormalities. Features are consistent with a pseudonormal left ventricular filling pattern, with concomitant abnormal relaxation and increased filling pressure (grade 2 diastolic dysfunction).   Neuro/Psych negative psych ROS   GI/Hepatic Neg liver ROS, GERD  Medicated and Controlled,  Endo/Other  diabetesMorbid obesity  Renal/GU   negative genitourinary   Musculoskeletal  (+) Arthritis , Osteoarthritis,    Abdominal   Peds negative pediatric ROS (+)  Hematology   Anesthesia Other Findings   Reproductive/Obstetrics negative OB ROS                            Anesthesia Physical  Anesthesia Plan  ASA: III  Anesthesia Plan: General   Post-op Pain Management:    Induction: Intravenous  PONV Risk Score and Plan: Treatment may vary due to age or medical condition and Ondansetron  Airway Management Planned: LMA and Oral ETT  Additional Equipment:   Intra-op Plan:   Post-operative Plan: Extubation in OR  Informed Consent: I have reviewed the patients History and Physical, chart, labs and  discussed the procedure including the risks, benefits and alternatives for the proposed anesthesia with the patient or authorized representative who has indicated his/her understanding and acceptance.   Dental advisory given  Plan Discussed with: CRNA, Anesthesiologist and Surgeon  Anesthesia Plan Comments:         Anesthesia Quick Evaluation

## 2018-01-13 NOTE — Op Note (Addendum)
Operative Note  [name]  [id number]  [id number]  01/13/2018   Surgeon: Victorino Sparrow ConnorMD  Assistant: Gurney Maxin MD  Procedure performed: laparoscopic assisted sigmoid colectomy, laparoscopic mobilization of splenic flexure, conversion to open for takedown of colocutaneous fistula, colorectal anastomosis, diverting loop ileostomy  Preop diagnosis: perforated diverticulitis with colocutaneous fistula Post-op diagnosis/intraop findings: same  Specimens: sigmoid colon Retained items: 5fr round blake drain EBL: [id number] Complications: none  Description of procedure: After obtaining informed consent the patient was taken to the operating room and placed supine on operating room table wheregeneral endotracheal anesthesia was initiated, preoperative antibiotics were administered, SCDs applied, and a formal timeout was performed. The patient was placed in dorsal lithotomy with all pressure points appropriately padded. Dr. Louis Meckel performed cystoscopy and bilateral ureteral stents- see his separate op note. The abdomen was then prepped and draped in the usual sterilr fashion. Peritoneal access was gained using a visiport technique in the left upper quadrant. Insufflation to 44mmHg ensued without incident. The abdomen was surveyed and aside from the known colocutaneous fistula surrounding the percutaneously placed drain in the low midline, no gross abnormalities were seen. Under direct visualization three more 22mm trocars were place in the supraumbilical and left hemiabdomen. The harmonic scalpel combined with blunt dissection were used to divide the lateral attachments of the sigmoid and descending colon to medialize the bowel. The patient was then placed in reverse Trendelenburg position and the splenic flexure was mobilized again using comminution of harmonic scalpel and blunt dissection. The dense scar and inflammatory reaction surrounding the colon cutaneous fistula precluded safe  laparoscopic dissection. A low midline incision was made in the soft tissues dissected with cautery until linea alba could be identified and divided. The perineal cavity was then entered and a Alexis wound protector placed. Finger fracture blunt dissection and cautery were used to free the cicatrix surrounding the draining tract and fistula from the anterior abdominal wall. There was adherent scar and tract to the peritoneum overlying the bladder in the pelvison the right side and this was left undisturbed after limited dissection to confirm no communication with the adjacent cecum. The small bowel was run from the ileocecal valve proximally to the ligament of Treitz and was confirmed to be free of any injury or abnormality. We completed the medialization of the sigmoid and descending colon and we were able to feel the ureteral stents deep to the field of our dissection and confirmed to be free of injury. A window was created in the mesentery adjacent to the proximal rectum and this was transected with the blue load linear cutting stapler. The stapler was again used to divide the sigmoid from the descending colon. The intervening mesentery was divided with the LigaSure and hemostasis confirmed. The pursestring device was then used to thread a Prolene suture through the wall of the descending colon and the staple line was excised. The pursestring her was removed and the anvil of the 63mm covidien stapler was inserted and secured with the pursestring. The stapler was then inserted from below and the spike brought out just anterior to the rectal staple line. The anvil was secured to the spike and the stapler fired and removed. The donuts were intact. The colon was occluded proximally and a rigid proctoscope was inserted. Leak test was performed with air after instilling the pelvis with saline. There were no bubbles or evidence of a leak on distention of the anastomosis. At this point gowns gloves and instruments were  changed. The abdomen  was irrigated with 2 L of warm sterile saline and hemostasis once again confirmed. An ostomy site was created in the right lower quadrant and a loop ileostomy brought out which is about 20 cm proximal to the ileocecal valve. This is secured with a red rubber catheter. A 19 French round Blake drain was brought through our right upper quadrant trocar site and directed to sit over the prior abscess cavity in the pelvis. This is secured the skin with a 3-0 nylon. Seprafilm was placed in the abdomen specifically surrounding the loop ileostomy after bringing the omentum down over the viscera and anastomosis.  The midline was then closed with running looped #1 PDS starting at either end and tying centrally. The soft tissues were irrigated and the skin was closed loosely with staples. A provena VAC was applied.  The loop ileostomy was then matured in standard Brooke fashion using interrupted 3-0 Vicryls. An ostomy appliance was placed.the drain was placed to bulb suction. The ureteral stents were removed, inspected, and confirmed to be intact. The patient was then awakened, extubated and taken to PACU in stable condition.   All counts were correct at the completion of the case.

## 2018-01-13 NOTE — Interval H&P Note (Signed)
History and Physical Interval Note:  01/13/2018 10:46 AM  [name]  has presented today for surgery, with the diagnosis of diverticulitis  The various methods of treatment have been discussed with the patient and family. After consideration of risks, benefits and other options for treatment, the patient has consented to  Procedure(s): LAPAROSCOPIC VERSUS OPEN SIGMOID COLECTOMY (N/A) POSSIBLE OSTOMY, BILATERAL URETERAL STENTS (N/A) CYSTOSCOPY WITH BILATERAL STENT PLACEMENT (Bilateral) as a surgical intervention .  The patient's history has been reviewed, patient examined, no change in status, stable for surgery.  I have reviewed the patient's chart and labs.  Questions were answered to the patient's satisfaction.     [name]

## 2018-01-14 ENCOUNTER — Inpatient Hospital Stay (HOSPITAL_COMMUNITY): Payer: 59

## 2018-01-14 LAB — CBC
HCT: 28 % — ABNORMAL LOW (ref 39.0–52.0)
Hemoglobin: 9.5 g/dL — ABNORMAL LOW (ref 13.0–17.0)
MCH: 27.7 pg (ref 26.0–34.0)
MCHC: 33.9 g/dL (ref 30.0–36.0)
MCV: 81.6 fL (ref 78.0–100.0)
Platelets: 215 K/uL (ref 150–400)
RBC: 3.43 MIL/uL — ABNORMAL LOW (ref 4.22–5.81)
RDW: 14.1 % (ref 11.5–15.5)
WBC: 13.5 K/uL — ABNORMAL HIGH (ref 4.0–10.5)

## 2018-01-14 LAB — BASIC METABOLIC PANEL WITH GFR
Anion gap: 9 (ref 5–15)
BUN: 19 mg/dL (ref 6–20)
CO2: 24 mmol/L (ref 22–32)
Calcium: 7.8 mg/dL — ABNORMAL LOW (ref 8.9–10.3)
Chloride: 105 mmol/L (ref 101–111)
Creatinine, Ser: 1.74 mg/dL — ABNORMAL HIGH (ref 0.61–1.24)
GFR calc Af Amer: 46 mL/min — ABNORMAL LOW
GFR calc non Af Amer: 40 mL/min — ABNORMAL LOW
Glucose, Bld: 140 mg/dL — ABNORMAL HIGH (ref 65–99)
Potassium: 4.3 mmol/L (ref 3.5–5.1)
Sodium: 138 mmol/L (ref 135–145)

## 2018-01-14 LAB — GLUCOSE, CAPILLARY
Glucose-Capillary: 101 mg/dL — ABNORMAL HIGH (ref 65–99)
Glucose-Capillary: 121 mg/dL — ABNORMAL HIGH (ref 65–99)
Glucose-Capillary: 130 mg/dL — ABNORMAL HIGH (ref 65–99)
Glucose-Capillary: 135 mg/dL — ABNORMAL HIGH (ref 65–99)
Glucose-Capillary: 138 mg/dL — ABNORMAL HIGH (ref 65–99)
Glucose-Capillary: 158 mg/dL — ABNORMAL HIGH (ref 65–99)

## 2018-01-14 LAB — MAGNESIUM: Magnesium: 1 mg/dL — ABNORMAL LOW (ref 1.7–2.4)

## 2018-01-14 LAB — PHOSPHORUS: Phosphorus: 4 mg/dL (ref 2.5–4.6)

## 2018-01-14 LAB — TROPONIN I
Troponin I: <0.03 ng/mL (ref <0.03)
Troponin I: <0.03 ng/mL (ref <0.03)

## 2018-01-14 MED ORDER — METOPROLOL TARTRATE 5 MG/5ML IV SOLN
5.0000 mg | Freq: Four times a day (QID) | INTRAVENOUS | Status: DC | PRN
  Administered 2018-01-14 – 2018-01-20 (×2): 5 mg via INTRAVENOUS
  Filled 2018-01-14 (×2): qty 5

## 2018-01-14 MED ORDER — METHOCARBAMOL 1000 MG/10ML IJ SOLN
500.0000 mg | Freq: Four times a day (QID) | INTRAMUSCULAR | Status: DC | PRN
  Administered 2018-01-14 – 2018-01-19 (×4): 500 mg via INTRAVENOUS
  Filled 2018-01-14: qty 5
  Filled 2018-01-14 (×3): qty 550
  Filled 2018-01-14: qty 5

## 2018-01-14 MED ORDER — PHENOL 1.4 % MT LIQD
1.0000 | OROMUCOSAL | Status: DC | PRN
  Administered 2018-01-14: 1 via OROMUCOSAL
  Filled 2018-01-14: qty 177

## 2018-01-14 MED ORDER — FUROSEMIDE 10 MG/ML IJ SOLN
20.0000 mg | Freq: Once | INTRAMUSCULAR | Status: AC
  Administered 2018-01-14: 20 mg via INTRAVENOUS
  Filled 2018-01-14: qty 2

## 2018-01-14 MED ORDER — ACETAMINOPHEN 500 MG PO TABS
1000.0000 mg | ORAL_TABLET | Freq: Four times a day (QID) | ORAL | Status: DC
  Filled 2018-01-14 (×2): qty 2

## 2018-01-14 MED ORDER — IPRATROPIUM-ALBUTEROL 0.5-2.5 (3) MG/3ML IN SOLN: 3.0000 mL | Freq: Four times a day (QID) | RESPIRATORY_TRACT | Status: DC | PRN

## 2018-01-14 MED ORDER — MAGNESIUM OXIDE 400 (241.3 MG) MG PO TABS
400.0000 mg | ORAL_TABLET | Freq: Two times a day (BID) | ORAL | Status: DC
  Administered 2018-01-14 (×2): 400 mg via ORAL
  Filled 2018-01-14 (×2): qty 1

## 2018-01-14 MED ORDER — MAGNESIUM SULFATE 4 GM/100ML IV SOLN
4.0000 g | Freq: Once | INTRAVENOUS | Status: AC
  Administered 2018-01-14: 4 g via INTRAVENOUS
  Filled 2018-01-14 (×2): qty 100

## 2018-01-14 MED ORDER — GABAPENTIN 300 MG PO CAPS
300.0000 mg | ORAL_CAPSULE | Freq: Three times a day (TID) | ORAL | Status: DC
  Administered 2018-01-14 – 2018-01-18 (×13): 300 mg via ORAL
  Filled 2018-01-14 (×13): qty 1

## 2018-01-14 NOTE — Progress Notes (Signed)
OT Cancellation Note  Patient Details Name: [name] MRN: [id number] DOB: 1956-03-02   Cancelled Treatment:    Reason Eval/Treat Not Completed: Pain limiting ability to participate and increased BP.  Spoke with RN. Will check back later in day or next day. Kari Baars, [place]  Payton Mccallum D 01/14/2018, 10:40 AM

## 2018-01-14 NOTE — Progress Notes (Signed)
Pt alert, but very sleepy. Complaints of a congested cough, and now some pain in the chest when taking deep breaths. Rhonchi heard upon auscultation. MD made aware, and new orders placed.  RN will monitor closely.

## 2018-01-14 NOTE — Progress Notes (Signed)
Pt presented w sweats, cold clammy skin. Complaints of nausea, vomiting earlier this morning.   EKG done, and placed on chart.  BP at 183/85 Hr 80, IV hydralizine given.  Md made aware and orders for chest xray, abd xray, and Ng tube were given.  RN will continue to monitor.

## 2018-01-14 NOTE — Progress Notes (Signed)
PT Cancellation Note  Patient Details Name: [name] MRN: [id number] DOB: 05/05/1956   Cancelled Treatment:    Reason Eval/Treat Not Completed: Pain limiting ability to participate and increased BP.  Spoke with RN. Will check back later in day or next day.   Floria Raveling. Hartnett-Rands, MS, PT Per [place] [phone] 01/14/2018, 1:53 PM

## 2018-01-14 NOTE — Anesthesia Postprocedure Evaluation (Signed)
Anesthesia Post Note  Patient: [name]  Procedure(s) Performed: LAPAROSCOPIC ASSISTED   SIGMOID COLECTOMY ILEOSTOMY (N/A Abdomen) ILEOSTOMY (Abdomen) CYSTOSCOPY WITH BILATERAL URETERAL CATHETER PLACEMENT (Bilateral Urethra)     Patient location during evaluation: PACU Anesthesia Type: General Level of consciousness: awake and alert Pain management: pain level controlled Vital Signs Assessment: post-procedure vital signs reviewed and stable Respiratory status: spontaneous breathing, nonlabored ventilation, respiratory function stable and patient connected to nasal cannula oxygen Cardiovascular status: blood pressure returned to baseline and stable Postop Assessment: no apparent nausea or vomiting Anesthetic complications: no    Last Vitals:  Vitals:   01/14/18 1023 01/14/18 1134  BP: (!) 170/83 (!) 183/87  Pulse: 83 86  Resp: 18   Temp: 37.4 C   SpO2: 100%     Last Pain:  Vitals:   01/14/18 1129  TempSrc:   PainSc: 10-Worst pain ever                 Emmalynn Pinkham

## 2018-01-14 NOTE — Consult Note (Signed)
Explained role of ostomy nurse and creation of stoma  Demonstrated pouch change (cutting new skin barrier, measuring stoma, cleaning peristomal skin and stoma, use of barrier ring) Patient was too sleepy to keep his eyes open during our session, to ask questions, or to answer anything but yes/no questions.  I notified his primary RN and she stated she had given him some pain medication. I changed his operative pouch.  He is using a one Event organiser [address] [phone]) available in the stock room, with a barrier ring.  His stoma measures 1 3/4 inches, is dark beefy red, moist, all sutures intact, red robin cath in place, and producing flatus.  The peristomal area is intact.  He has a midline Prevena VAC dressing that was left undisturbed and sealed during the pouch change.  I plan to return tomorrow to attempt teaching.  I left the [place] booklet on understanding your ileostomy at his bedside.  No family was present during my visit in 1522. Val Riles, RN, MSN, CWOCN, CNS-BC, pager [phone]

## 2018-01-14 NOTE — Plan of Care (Signed)
Pt alert, very sleepy, complaints of constant severe pain. Meds given per MD order.  IS encouraged, lasix given Iv. RN will monitor.

## 2018-01-14 NOTE — Evaluation (Deleted)
PT Cancellation Note  Patient Details Name: RAGNAR WAAS MRN: [id number] DOB: 1956-03-22   Cancelled Treatment:    Reason Eval/Treat Not Completed:  Pain limiting ability to participate and increased BP.  Spoke with RN. Will check back later in day or next day.   Floria Raveling. Hartnett-Rands, MS, PT Per [place] [phone] 01/14/2018, 1:51 PM

## 2018-01-14 NOTE — Progress Notes (Signed)
S: Issues with pain control- back and abdominal- but did get some sleep. Mild nausea, no emesis, has been drinking water.   Vitals, labs, intake/output, and orders reviewed at this time. Tmax 99.8, mildly hypertensive, HR 69, sats 100 % on nasal cannula PO intake not recorded. UOP 750 overnight, drain output 350 overnight, stool output 450. Appx 1163mL positive thus far  Gen: A&Ox3, no distress  H&N: EOMI, atraumatic, neck supple Chest: unlabored respirations, RRR Abd: soft, diffusely tender, moderately distended, incision w provena intact holding suction. JP drain is light serosanguinous with some gelatinous particles (probably seprafilm). Ostomy is edematous and deep red but viable with liquid bilious output and some gas in bag Ext: warm, no edema, R BKA Neuro: grossly normal GU: urine is tea colored- improved compared to bloody urine immediately post op yesterday  Lines/tubes/drains: PIV, drain, foley  A/P:  POD 1 lap assisted sigmoid colectomy w colorectal anastamosis and diverting loop ileostomy, takedown of colocutaneous fistula -Continue clears for today- ostomy is functioning but he is distended -Will add robaxin for better pain control -Hypomagnesemia- replace -Acute kidney injury on CKD (cr 1.74 baseline 1.4-1.5)- continue IVF, continue foley for today, avoid any nephrotoxic agents/ nsaids other than baby aspirin/ recheck tomorrow. Monitor I&Os strictly- risk of fluid overload with hx CHF and vascular disease.  -Leukocytosis- appropriate post op day 1. hgb stable -Insulin dependent diabetes- continue sliding scale -HTN- home coreg and imdur ordered, add meds one at a time if needed -PVD/ CAD- continue [place], [facility], [place] Pager [phone]

## 2018-01-15 LAB — BASIC METABOLIC PANEL
Anion gap: 11 (ref 5–15)
BUN: 24 mg/dL — ABNORMAL HIGH (ref 6–20)
CO2: 27 mmol/L (ref 22–32)
Calcium: 7.8 mg/dL — ABNORMAL LOW (ref 8.9–10.3)
Chloride: 103 mmol/L (ref 101–111)
Creatinine, Ser: 1.9 mg/dL — ABNORMAL HIGH (ref 0.61–1.24)
GFR calc Af Amer: 42 mL/min — ABNORMAL LOW (ref >60)
GFR calc non Af Amer: 36 mL/min — ABNORMAL LOW (ref >60)
Glucose, Bld: 80 mg/dL (ref 65–99)
Potassium: 3.6 mmol/L (ref 3.5–5.1)
Sodium: 141 mmol/L (ref 135–145)

## 2018-01-15 LAB — CBC
HCT: 27.2 % — ABNORMAL LOW (ref 39.0–52.0)
Hemoglobin: 9 g/dL — ABNORMAL LOW (ref 13.0–17.0)
MCH: 27.2 pg (ref 26.0–34.0)
MCHC: 33.1 g/dL (ref 30.0–36.0)
MCV: 82.2 fL (ref 78.0–100.0)
Platelets: 237 10*3/uL (ref 150–400)
RBC: 3.31 MIL/uL — ABNORMAL LOW (ref 4.22–5.81)
RDW: 14.3 % (ref 11.5–15.5)
WBC: 10.9 10*3/uL — ABNORMAL HIGH (ref 4.0–10.5)

## 2018-01-15 LAB — GLUCOSE, CAPILLARY
Glucose-Capillary: 128 mg/dL — ABNORMAL HIGH (ref 65–99)
Glucose-Capillary: 77 mg/dL (ref 65–99)
Glucose-Capillary: 80 mg/dL (ref 65–99)
Glucose-Capillary: 84 mg/dL (ref 65–99)
Glucose-Capillary: 96 mg/dL (ref 65–99)
Glucose-Capillary: 97 mg/dL (ref 65–99)

## 2018-01-15 LAB — MAGNESIUM: Magnesium: 2.1 mg/dL (ref 1.7–2.4)

## 2018-01-15 LAB — TROPONIN I: Troponin I: <0.03 ng/mL (ref <0.03)

## 2018-01-15 MED ORDER — ACETAMINOPHEN 325 MG PO TABS
650.0000 mg | ORAL_TABLET | Freq: Four times a day (QID) | ORAL | Status: AC
  Administered 2018-01-15 – 2018-01-17 (×10): 650 mg via ORAL
  Filled 2018-01-15 (×9): qty 2

## 2018-01-15 NOTE — Progress Notes (Signed)
Verbal order from Dr. Excell Seltzer to keep patient completely NPO and to keep Ng tube out for now until MD re evaluate him this morning. We will continue to monitor.

## 2018-01-15 NOTE — Evaluation (Signed)
Occupational Therapy Evaluation Patient Details Name: [name] MRN: [id number] DOB: 1956/04/12 Today's Date: 01/15/2018    History of Present Illness POD 2 lap assisted sigmoidectomy with diverting loop ileostomy. Acute kidney injury on chronic kidney disease- creatinine up, urine output ok, elytes ok, continue fluids and recheck tomorrow, continue foley for UOP monitoring; avoid any nephrotoxic agents/ nsaids other than baby aspirin/ recheck tomorrow. Monitor I&Os strictly- risk of fluid overload with hx CHF and vascular disease.Atelectasis- aggressive pulm toilet, encouraged pt to use IS 10x per hour and explained reasoning    Clinical Impression   Pt admitted with the above Pt currently with functional limitations due to the deficits listed below (see OT Problem List).  Pt will benefit from skilled OT to increase their safety and independence with ADL and functional mobility for ADL to facilitate discharge to venue listed below.      Follow Up Recommendations  No OT follow up    Equipment Recommendations  None recommended by OT    Recommendations for Other Services       Precautions / Restrictions Precautions Precautions: Fall Required Braces or Orthoses: Other Brace/Splint(Rt BKA prosthesis; Lt AFO w/ shoe) Restrictions Weight Bearing Restrictions: No      Mobility Bed Mobility Overal bed mobility: Needs Assistance Bed Mobility: Supine to Sit;Sit to Supine     Supine to sit: Min assist     General bed mobility comments: for drain/line management and to gather needed prosthesis and assistive devices  Transfers Overall transfer level: Needs assistance Equipment used: Rolling walker (2 wheeled) Transfers: Sit to/from Stand Sit to Stand: Min assist         General transfer comment: for drain/line management and for safety    Balance Overall balance assessment: Needs assistance Sitting-balance support: Bilateral upper extremity supported Sitting balance-Leahy  Scale: Good     Standing balance support: Bilateral upper extremity supported;During functional activity Standing balance-Leahy Scale: Fair                             ADL either performed or assessed with clinical judgement   ADL Overall ADL's : Needs assistance/impaired Eating/Feeding: Independent;Sitting   Grooming: Set up;Sitting   Upper Body Bathing: Set up;Sitting   Lower Body Bathing: Minimal assistance;Sit to/from stand;Sitting/lateral leans Lower Body Bathing Details (indicate cue type and reason): sitting EOB Upper Body Dressing : Set up;Sitting   Lower Body Dressing: Minimal assistance;Sitting/lateral leans Lower Body Dressing Details (indicate cue type and reason): pt states he has his own way of doing things               General ADL Comments: pt declined OOB or getting to the recliner.     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       Communication Communication Communication: No difficulties   Cognition Arousal/Alertness: Awake/alert Behavior During Therapy: WFL for tasks assessed/performed Overall Cognitive Status: Within Functional Limits for tasks assessed  Home Living Family/patient expects to be discharged to:: Private residence Living Arrangements: Spouse/significant other Available Help at Discharge: Family(wife, adult  son) Type of Home: Mobile home(modular) Home Access: Ramped entrance     [place]: One level     Bathroom Shower/Tub: Hospital doctor Toilet: Handicapped height     Home Equipment: Environmental consultant - 4 wheels;Bedside commode;Crutches;Wheelchair - manual;Hand held Tourist information centre manager - 2 wheels   Additional Comments: Rt BKA prosthesis; Lt AFO w/ shoe      Prior Functioning/Environment Level of Independence: Independent with assistive device(s)                 OT Problem List: Decreased strength;Impaired balance (sitting and/or standing);Decreased activity tolerance         OT Goals(Current goals can be found in the care plan section) Acute Rehab OT Goals Patient Stated Goal: less pain; go home OT Goal Formulation: With patient Time For Goal Achievement: 01/29/18 Potential to Achieve Goals: Good  OT Frequency:                AM-PAC PT "6 Clicks" Daily Activity     Outcome Measure Help from another person eating meals?: None Help from another person taking care of personal grooming?: A Little Help from another person toileting, which includes using toliet, bedpan, or urinal?: A Little Help from another person bathing (including washing, rinsing, drying)?: A Lot Help from another person to put on and taking off regular upper body clothing?: None Help from another person to put on and taking off regular lower body clothing?: A Lot 6 Click Score: 18   End of Session Equipment Utilized During Treatment: Rolling walker Nurse Communication: Mobility status  Activity Tolerance: Patient limited by pain(RN gave meds during OT session) Patient left: in bed;Other (comment);with bed alarm set(sitting EOB)  OT Visit Diagnosis: Unsteadiness on feet (R26.81);Other abnormalities of gait and mobility (R26.89);Muscle weakness (generalized) (M62.81)                Time:  -    Charges:  OT General Charges $OT Visit: 1 Visit OT Evaluation $OT Eval Moderate Complexity: 1  Mod G-Codes:     Kari Baars, [place]  Betsy Pries 01/15/2018, 6:13 PM

## 2018-01-15 NOTE — Consult Note (Signed)
[place] Nurse ostomy follow up In to see patient in 1522 for teaching session.  One hour was spent covering the following: Explained stoma characteristics (budded, flush, color, texture, care) Education on emptying when 1/3 to 1/2 full and how to empty Demonstrated "burping" flatus from pouch Demonstrated use of wick to clean spout  Discussed bathing, diet, gas, medication use, diarrhea, dehydration  Discussed food blockage   Discussed risk of peristomal hernia Answered patient questions; no family present. Val Riles, RN, MSN, CWOCN, CNS-BC, pager [phone]

## 2018-01-15 NOTE — Progress Notes (Addendum)
Found patient with NG tube out from his nose, was asked about what happen and states " yes I pulled it out it was hurting,now It's out it is not hurting anymore" . Patient was educated about the importance of such treatment and a possibility to put another one in. We will notify on call provider.

## 2018-01-15 NOTE — Consult Note (Signed)
[place] Nurse ostomy follow up To patient's room 1522 to perform teaching session.  Pt awake, alert, able to carry on a conversation, putting his right leg prosthesis on.  Patient has other priorities of his own at this time that supercedes ostomy teaching.  I plan to revisit after lunch today. Val Riles, RN, MSN, CWOCN, CNS-BC, pager [phone]

## 2018-01-15 NOTE — Evaluation (Signed)
Physical Therapy Evaluation Patient Details Name: [name] MRN: [id number] DOB: [birth year] Today's Date: 01/15/2018   History of Present Illness  POD 2 lap assisted sigmoidectomy with diverting loop ileostomy. Acute kidney injury on chronic kidney disease- creatinine up, urine output ok, elytes ok, continue fluids and recheck tomorrow, continue foley for UOP monitoring; avoid any nephrotoxic agents/ nsaids other than baby aspirin/ recheck tomorrow. Monitor I&Os strictly- risk of fluid overload with hx CHF and vascular disease.Atelectasis- aggressive pulm toilet, encouraged pt to use IS 10x per hour and explained reasoning     Clinical Impression  Patient looking much better today but still quite painful with movement/activity. Modified independent for bed mobility; min assist for transfers; min assist/min guard for ambulation with RW 10 feet. 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 Needs min assist to don orthotic. Patient has assistance at home through wife and adult son and accessible home entry. Pt admitted with above diagnosis. Pt currently with functional limitations due to the deficits listed below (see PT Problem List). Pt will benefit from skilled PT to increase their independence and safety with mobility to allow discharge to the venue listed below.       Follow Up Recommendations Home health PT;Supervision for mobility/OOB    Equipment Recommendations  None recommended by PT    Recommendations for Other Services       Precautions / Restrictions Precautions Precautions: Fall Required Braces or Orthoses: Other Brace/Splint(Rt BKA prosthesis; Lt AFO w/ shoe) Restrictions Weight Bearing Restrictions: No      Mobility  Bed Mobility Overal bed mobility: Needs Assistance Bed Mobility: Supine to Sit;Sit to Supine     Supine to sit: Min assist     General bed mobility comments: for drain/line management and to gather needed prosthesis and assistive  devices  Transfers Overall transfer level: Needs assistance Equipment used: Rolling walker (2 wheeled) Transfers: Sit to/from [facility] Sit to Stand: Min assist Stand pivot transfers: Min assist       General transfer comment: for drain/line management and for safety  Ambulation/Gait Ambulation/Gait assistance: Min guard Ambulation Distance (Feet): 20 Feet(10' x 2) Assistive device: Rolling walker (2 wheeled) Gait Pattern/deviations: Step-through pattern;Decreased step length - right;Decreased step length - left;Trunk flexed Gait velocity: decreased   General Gait Details: slow, steady pace; good safety awareness  Stairs            Wheelchair Mobility    Modified Rankin (Stroke Patients Only)       Balance Overall balance assessment: Needs assistance Sitting-balance support: Bilateral upper extremity supported Sitting balance-Leahy Scale: Good     Standing balance support: Bilateral upper extremity supported;During functional activity Standing balance-Leahy Scale: Fair                               Pertinent Vitals/Pain Pain Assessment: 0-10 Pain Score: 9  Pain Location: abdomen Pain Descriptors / Indicators: Stabbing Pain Intervention(s): Limited activity within patient's tolerance    Home Living Family/patient expects to be discharged to:: Private residence Living Arrangements: Spouse/significant other Available Help at Discharge: Family(wife, adult son) Type of Home: Mobile home(modular) Home Access: Ramped entrance     Home Layout: One level Home Equipment: [place] - 4 wheels;Bedside commode;Crutches;Wheelchair - manual;Hand held [employer] information centre manager - 2 wheels Additional Comments: Rt BKA prosthesis; Lt AFO w/ shoe    Prior Function Level of Independence: Independent with assistive device(s)  Hand Dominance        Extremity/Trunk Assessment   Upper Extremity Assessment Upper Extremity  Assessment: Generalized weakness    Lower Extremity Assessment Lower Extremity Assessment: Generalized weakness;RLE deficits/detail;LLE deficits/detail RLE Deficits / Details: Rt BKA LLE Deficits / Details: 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       Communication   Communication: No difficulties  Cognition Arousal/Alertness: Awake/alert Behavior During Therapy: WFL for tasks assessed/performed Overall Cognitive Status: Within Functional Limits for tasks assessed                                        General Comments      Exercises     Assessment/Plan    PT Assessment Patient needs continued PT services  PT Problem List Decreased strength;Decreased mobility;Decreased activity tolerance;Decreased balance;Pain       PT Treatment Interventions DME instruction;Therapeutic activities;Patient/family education;Therapeutic exercise;Gait training;Balance training;Neuromuscular re-education;Functional mobility training    PT Goals (Current goals can be found in the Care Plan section)  Acute Rehab PT Goals Patient Stated Goal: less pain; go home PT Goal Formulation: With patient Time For Goal Achievement: 01/29/18 Potential to Achieve Goals: Good    Frequency Min 3X/week   Barriers to discharge        Co-evaluation               AM-PAC PT "6 Clicks" Daily Activity  Outcome Measure Difficulty turning over in bed (including adjusting bedclothes, sheets and blankets)?: A Little Difficulty moving from lying on back to sitting on the side of the bed? : A Little Difficulty sitting down on and standing up from a chair with arms (e.g., wheelchair, bedside commode, etc,.)?: A Little Help needed moving to and from a bed to chair (including a wheelchair)?: A Little Help needed walking in hospital room?: A Little Help needed climbing 3-5 steps with a railing? : A Lot 6 Click Score: 17    End of Session Equipment Utilized During Treatment: Gait belt Activity Tolerance:  Patient limited by pain Patient left: in bed;with bed alarm set Nurse Communication: Mobility status PT Visit Diagnosis: Unsteadiness on feet (R26.81);Other abnormalities of gait and mobility (R26.89);Difficulty in walking, not elsewhere classified (R26.2);Muscle weakness (generalized) (M62.81)    Time: 0350-0938 PT Time Calculation (min) (ACUTE ONLY): 34 min   Charges:   PT Evaluation $PT Eval Moderate Complexity: 1 Mod PT Treatments $Gait Training: 8-22 mins $Therapeutic Exercise: 8-22 mins   PT G Codes:        Gavrielle Streck D. Hartnett-Rands, MS, PT [facility] [place] [phone] 01/15/2018, 10:15 AM

## 2018-01-15 NOTE — Progress Notes (Addendum)
S: Had a rough afternoon yesterday, had NG inserted but pulled it out overnight, feels much better this morning. Passed some stool per rectum this morning.   Has not been receiving his scheduled tylenol. Has been receiving scheduled gabapentin. Has received Robaxin x 2 doses Dilaudid x 5 yesterday, last dose 22:28 Received only one dose of oxycodone yesterday at 0436 No reglan, zofran x 2 yesterday  Vitals, labs, intake/output, and orders reviewed at this time:   Afebrile. No tachycardia. Increasingly hypertensive yesterday afternoon, normotensive this morning. 94% on room air.   Creatinine uptrending- 1.9 (1.74 yesterday, baseline 1.4-1.5). Electrolytes including K and Mag otherwise normal.  WBC down 10.9 (13.5 yesterday) HGB stable 9.0 (9.5 yest, 10.0 preop) Plt 237 (215)  PO intake 220 yesterday. Some unrecorded episodes of emesis.  Urine output 2770 (520 overnight, seemed to have a brisk 1700 after receiving lasix at 14:25 Drain output has slowed down. 416 total yest, 40 overnight. There is output recorded from a wound vac, but the cannister is dry and he states it has never been emptied. Stool out 162, + the bag is full now. NG 350, but he has removed it.  He is about 1L positive this admission.   Plain films yesterday showed mildly dilated small bowel loops, low lung volumes with atelectasis.  Gen: A&Ox3, no distress, looks a lot more comfortable this morning H&N: EOMI, atraumatic, neck supple Chest: unlabored respirations, RRR, Pulls about 1200 on IS.  Abd: soft, appropriately tender, mildly distended (improved from yest), incision c/d/i with prevena holding suction. The cannister is empty and he states it has not been emptied at any point. Drain output is serosanguinous, ostomy is pink, edema better, gas and bilious output in bag.  Ext: warm, no edema, R bka Neuro: grossly normal GU- foley in, urine is much lighter but still a fair amount of brownish  sediment  Lines/tubes/drains: PIV, foley, drain  A/P:  POD 2 lap assisted sigmoidectomy with diverting loop ileostomy.  -Post op ileus (gastric)- seems to be resolving. Sips of clears today.  -Acute kidney injury on chronic kidney disease- creatinine up, urine output ok, elytes ok, continue fluids and recheck tomorrow, continue foley for UOP monitoring; avoid any nephrotoxic agents/ nsaids other than baby aspirin/ recheck tomorrow. Monitor I&Os strictly- risk of fluid overload with hx CHF and vascular disease.  -Atelectasis- aggressive pulm toilet, encouraged pt to use IS 10x per hour and explained reasoning -Continue schedule tylenol and gabapentin. Encouraged him to take oxycodone as well to bring over all pain level down. Continue PRN robaxin.  -Encouraged him to do some walking and be out of bed today, PT/OT were not able to work with him yesterday due to pain  -PVD/CAD- continue ASA 81, imdur, coreg -Leukocytosis- resolved. hgb stable -Insulin dependent diabetes- continue sliding scale -HTN- home coreg and imdur ordered, add meds one at a time if needed -PVD/ CAD- continue [place], MD [facility], [place] Pager [phone]

## 2018-01-16 LAB — BASIC METABOLIC PANEL
Anion gap: 8 (ref 5–15)
BUN: 26 mg/dL — ABNORMAL HIGH (ref 6–20)
CO2: 24 mmol/L (ref 22–32)
Calcium: 7.7 mg/dL — ABNORMAL LOW (ref 8.9–10.3)
Chloride: 106 mmol/L (ref 101–111)
Creatinine, Ser: 1.82 mg/dL — ABNORMAL HIGH (ref 0.61–1.24)
GFR calc Af Amer: 44 mL/min — ABNORMAL LOW (ref >60)
GFR calc non Af Amer: 38 mL/min — ABNORMAL LOW (ref >60)
Glucose, Bld: 86 mg/dL (ref 65–99)
Potassium: 3.6 mmol/L (ref 3.5–5.1)
Sodium: 138 mmol/L (ref 135–145)

## 2018-01-16 LAB — GLUCOSE, CAPILLARY
Glucose-Capillary: 88 mg/dL (ref 65–99)
Glucose-Capillary: 80 mg/dL (ref 65–99)
Glucose-Capillary: 216 mg/dL — ABNORMAL HIGH (ref 65–99)
Glucose-Capillary: 163 mg/dL — ABNORMAL HIGH (ref 65–99)
Glucose-Capillary: 243 mg/dL — ABNORMAL HIGH (ref 65–99)

## 2018-01-16 LAB — MAGNESIUM: Magnesium: 2.1 mg/dL (ref 1.7–2.4)

## 2018-01-16 MED ORDER — BOOST / RESOURCE BREEZE PO LIQD CUSTOM
1.0000 | Freq: Three times a day (TID) | ORAL | Status: DC
  Administered 2018-01-16 – 2018-01-17 (×4): 1 via ORAL

## 2018-01-16 MED ORDER — SODIUM CHLORIDE 0.45 % IV SOLN
INTRAVENOUS | Status: DC
  Administered 2018-01-16 – 2018-01-17 (×2): 1000 mL via INTRAVENOUS
  Administered 2018-01-19 (×2): via INTRAVENOUS
  Administered 2018-01-20: 1000 mL via INTRAVENOUS
  Administered 2018-01-20: 08:00:00 via INTRAVENOUS
  Administered 2018-01-22 (×2): 1000 mL via INTRAVENOUS

## 2018-01-16 MED ORDER — INSULIN ASPART 100 UNIT/ML ~~LOC~~ SOLN
0.0000 [IU] | Freq: Three times a day (TID) | SUBCUTANEOUS | Status: DC
  Administered 2018-01-16: 3 [IU] via SUBCUTANEOUS
  Administered 2018-01-16: 5 [IU] via SUBCUTANEOUS
  Administered 2018-01-17: 3 [IU] via SUBCUTANEOUS
  Administered 2018-01-17: 5 [IU] via SUBCUTANEOUS
  Administered 2018-01-17: 3 [IU] via SUBCUTANEOUS
  Administered 2018-01-18: 2 [IU] via SUBCUTANEOUS

## 2018-01-16 NOTE — Progress Notes (Addendum)
S: Slept all night. Pain improving. Tolerating sips of clears, ostomy productive. Walked and worked with PT. Using IS.  Took zofran x 2 and oxycodone x 3 yesterday; no dilaudid or robaxin, receiving scheduled tylenol, neurontin, and fentanyl patch.  Vitals, labs, intake/output, and orders reviewed at this time. Afebrile, no tachycardia, normo- to hypertensive, sats 93% on room air. Labs pending. PO intake 420, UOP 800 (450 overnight), JP 55cc, stool 1010. He is about 154mL positive.   Gen: A&Ox3, no distress  H&N: EOMI, atraumatic, neck supple Chest: unlabored respirations, RRR Abd: soft, appropriately tender, mildly distended, incision with prevena holding suction. JP output is serosanguinous with some strands of fibrinous exudate. Ostomy is pink and budded with gas and stool in bag Ext: warm, no edema Neuro: grossly normal GU- urine clear  Lines/tubes/drains: PIV, drain, foley  A/P:  POD 3 lap assisted sigmoidectomy with diverting loop ileostomy.  -Post op ileus (gastric)- seems to be resolving. Ostomy output about 1L. Soft diet and boost today.  -Acute kidney injury on chronic kidney disease- Repeat labs today. Urine output ok. If creatinine downtrending will remove foley and decrease IVF. Avoid any nephrotoxic agents/ nsaids other than baby aspirin/ recheck tomorrow. Monitor I&Os strictly- risk of fluid overload with hx CHF and vascular disease.  -Atelectasis- aggressive pulm toilet, encouraged pt to use IS 10x per hour and explained reasoning -Continue current pain regimen.  -Continue to mobilize/ out of bed as much as possible  -PVD/CAD- continue ASA 81, imdur, coreg -Leukocytosis- resolved. hgb stable (as of 5/8) -Insulin dependent diabetes- continue sliding scale -HTN- home coreg and imdur ordered, add meds one at a time if needed -PVD/ CAD- continue Asa 81   Dispo- pending improvement in AKI and PO intake   Addendum- creatinine 1.82 down from 1.9. DC foley this evening.  Change IVF to 0.45%NS at 72ml/hr.  Romana Juniper, MD [facility], [place] Pager [phone]

## 2018-01-16 NOTE — Progress Notes (Signed)
Spoke with patient at bedside. States he was recently active with [facility] for [facility] services and would like to use them again. Contacted [facility] rep for referral, anticipating d/c 5/12 or 5/13. [phone]

## 2018-01-16 NOTE — Consult Note (Signed)
[place] Nurse ostomy follow up Stoma type/location: RUQ ileostomy Existing pouch removed; contained 75 ml of liquid green stool.  Patient participated in performing the pouch removal via the "push/pull" method to minimize epidermal stripping.  Patient watched measuring the stoma with the measuring guide, how to trace the size onto a new pouch barrier, how to cut the opening, how to apply the barrier ring, and place the pouch around the stoma.  He has reviewed and will review again, the printed [facility] materials about an ileostomy.  He understands burping and emptying and states he has performed this for himself already.  I am enrolling him in the [facility] secure start program.  He will need to perform pouch change procedures on Monday with another member of the [facility] team.  Val Riles, RN, MSN, CWOCN, CNS-BC, pager [phone]

## 2018-01-16 NOTE — Progress Notes (Signed)
Physical Therapy Treatment Patient Details Name: [name] MRN: [id number] DOB: 02/02/1956 Today's Date: 01/16/2018    History of Present Illness sigmoidectomy with diverting loop ileostomy    PT Comments    Pt in bed already wearing his BKA prosthesis and L AFO with show.  "Yeah I just slept in them".  Assisted OOB and tolerated amb full unit.  Mils ABD discomfort from recent surgery.  Positioned in recliner with multiple pillows.  Pt stated he has a lift recliner at home he uses.    Follow Up Recommendations  Home health PT;Supervision for mobility/OOB     Equipment Recommendations  None recommended by PT    Recommendations for Other Services       Precautions / Restrictions Precautions Precautions: Fall Required Braces or Orthoses: Other Brace/Splint Other Brace/Splint: R BKA Prosthesis and L AFO with show    Mobility  Bed Mobility Overal bed mobility: Needs Assistance Bed Mobility: Supine to Sit     Supine to sit: Min guard;Supervision     General bed mobility comments: HOB elevated and use of rail with increased time  Transfers Overall transfer level: Needs assistance Equipment used: Rolling walker (2 wheeled) Transfers: Sit to/from Stand Sit to Stand: Min guard;Min assist Stand pivot transfers: Min guard;Min assist       General transfer comment: for drain/line management and for safety otherwise pt able to self perform  Ambulation/Gait Ambulation/Gait assistance: Min guard;Min assist Ambulation Distance (Feet): 350 Feet Assistive device: Rolling walker (2 wheeled) Gait Pattern/deviations: Step-through pattern;Decreased step length - right;Decreased step length - left;Trunk flexed Gait velocity: decreased   General Gait Details: slow, steady pace; good safety awareness  tolerated amb around unit   Stairs             Wheelchair Mobility    Modified Rankin (Stroke Patients Only)       Balance                                             Cognition Arousal/Alertness: Awake/alert Behavior During Therapy: WFL for tasks assessed/performed Overall Cognitive Status: Within Functional Limits for tasks assessed                                        Exercises      General Comments        Pertinent Vitals/Pain Pain Assessment: Faces Pain Location: ABD with activity Pain Descriptors / Indicators: Grimacing Pain Intervention(s): Limited activity within patient's tolerance    Home Living                      Prior Function            PT Goals (current goals can now be found in the care plan section) Progress towards PT goals: Progressing toward goals    Frequency    Min 3X/week      PT Plan Current plan remains appropriate    Co-evaluation              AM-PAC PT "6 Clicks" Daily Activity  Outcome Measure  Difficulty turning over in bed (including adjusting bedclothes, sheets and blankets)?: A Little Difficulty moving from lying on back to sitting on the side of the bed? : A Little Difficulty sitting  down on and standing up from a chair with arms (e.g., wheelchair, bedside commode, etc,.)?: A Little Help needed moving to and from a bed to chair (including a wheelchair)?: A Little Help needed walking in hospital room?: A Little Help needed climbing 3-5 steps with a railing? : A Lot 6 Click Score: 17    End of Session Equipment Utilized During Treatment: Gait belt Activity Tolerance: Patient tolerated treatment well Patient left: in chair;with call bell/phone within reach Nurse Communication: Mobility status PT Visit Diagnosis: Unsteadiness on feet (R26.81);Other abnormalities of gait and mobility (R26.89);Difficulty in walking, not elsewhere classified (R26.2);Muscle weakness (generalized) (M62.81)     Time: 8921-1941 PT Time Calculation (min) (ACUTE ONLY): 31 min  Charges:  $Gait Training: 8-22 mins $Therapeutic Activity: 8-22 mins                     G Codes:       Rica Koyanagi  PTA [facility]  Acute  Rehab Pager      [phone]

## 2018-01-17 LAB — BASIC METABOLIC PANEL
Anion gap: 9 (ref 5–15)
BUN: 21 mg/dL — ABNORMAL HIGH (ref 6–20)
CO2: 23 mmol/L (ref 22–32)
Calcium: 7.4 mg/dL — ABNORMAL LOW (ref 8.9–10.3)
Chloride: 103 mmol/L (ref 101–111)
Creatinine, Ser: 1.7 mg/dL — ABNORMAL HIGH (ref 0.61–1.24)
GFR calc Af Amer: 48 mL/min — ABNORMAL LOW (ref >60)
GFR calc non Af Amer: 41 mL/min — ABNORMAL LOW (ref >60)
Glucose, Bld: 197 mg/dL — ABNORMAL HIGH (ref 65–99)
Potassium: 3.5 mmol/L (ref 3.5–5.1)
Sodium: 135 mmol/L (ref 135–145)

## 2018-01-17 LAB — GLUCOSE, CAPILLARY
Glucose-Capillary: 178 mg/dL — ABNORMAL HIGH (ref 65–99)
Glucose-Capillary: 206 mg/dL — ABNORMAL HIGH (ref 65–99)
Glucose-Capillary: 173 mg/dL — ABNORMAL HIGH (ref 65–99)
Glucose-Capillary: 188 mg/dL — ABNORMAL HIGH (ref 65–99)

## 2018-01-17 MED ORDER — AMLODIPINE BESYLATE 5 MG PO TABS
5.0000 mg | ORAL_TABLET | Freq: Every day | ORAL | Status: DC
  Administered 2018-01-17: 5 mg via ORAL
  Filled 2018-01-17: qty 1

## 2018-01-17 MED ORDER — IRBESARTAN 150 MG PO TABS
150.0000 mg | ORAL_TABLET | Freq: Every day | ORAL | Status: DC
  Administered 2018-01-17: 150 mg via ORAL
  Filled 2018-01-17: qty 1

## 2018-01-17 MED ORDER — OXYCODONE HCL 5 MG PO TABS: 5.0000 mg | ORAL_TABLET | ORAL | 0 refills | Status: DC | PRN

## 2018-01-17 MED ORDER — HYDROMORPHONE HCL 1 MG/ML IJ SOLN
1.0000 mg | INTRAMUSCULAR | Status: DC | PRN
  Administered 2018-01-17 – 2018-01-20 (×12): 1 mg via INTRAVENOUS
  Filled 2018-01-17 (×13): qty 1

## 2018-01-17 MED ORDER — AMLODIPINE-OLMESARTAN 5-20 MG PO TABS: 1.0000 | ORAL_TABLET | Freq: Every evening | ORAL | Status: DC

## 2018-01-17 MED ORDER — INSULIN GLARGINE 100 UNIT/ML ~~LOC~~ SOLN
60.0000 [IU] | Freq: Every day | SUBCUTANEOUS | Status: DC
  Administered 2018-01-17: 60 [IU] via SUBCUTANEOUS
  Filled 2018-01-17 (×2): qty 0.6

## 2018-01-17 NOTE — Progress Notes (Signed)
Inpatient Diabetes Program Recommendations  AACE/ADA: New Consensus Statement on Inpatient Glycemic Control (2015)  Target Ranges:  Prepandial:   less than 140 mg/dL      Peak postprandial:   less than 180 mg/dL (1-2 hours)      Critically ill patients:  140 - 180 mg/dL   Results for QUASHAUN, LAZALDE (MRN [id number]) as of 01/17/2018 10:30  Ref. Range 01/16/2018 07:40 01/16/2018 11:46 01/16/2018 16:43 01/16/2018 20:35 01/17/2018 07:45  Glucose-Capillary Latest Ref Range: 65 - 99 mg/dL 80 216 (H) 163 (H) 243 (H) 178 (H)   Review of Glycemic Control  Diabetes history: DM 2 Outpatient Diabetes medications: Basaglar 60 units, Humalog 15 units tid with meals, Metformin 1000 mg BID Current orders for Inpatient glycemic control: Lantus 60 units, Novolog Moderate Correction 0-15 units tid  A1c 7.1% on 4/30  Inpatient Diabetes Program Recommendations:    Glucose trends with occassional elevations. Inpatient goal of 140-180 mg/dl. Noted home basal insulin ordered. May consider decreasing home basal insulin while inpatient to Lantus 12-15 units while here.  Thanks,  Tama Headings RN, MSN, BC-ADM, [facility] Inpatient Diabetes Coordinator Team Pager [phone] (8a-5p)

## 2018-01-17 NOTE — Progress Notes (Signed)
S: Slept well overnight. Pain continues to improve. Tolerated soft diet, had some heartburn but denies nausea or emesis. Ostomy functioning. Has urinated once since foley removed last night.   Vitals, labs, intake/output, and orders reviewed at this time. PO intake 1040. UOP 870. Drain output 320, stool output 50- I think this is not accurate, as he had at least 75cc stool and a lot of gas in the bag when I saw him yesterday, there is stool in the bag now, and his drain output has been lower- numbers are vastly different than yesterday. Appx 1500cc+ this admission.   Creatinine downtrending, now 1.7  Gen: A&Ox3, no distress  H&N: EOMI, atraumatic, neck supple Chest: unlabored respirations, RRR Abd: soft, appropriately tender, mildly distended (more than yesterday), incision with prevena holding suction. Ostomy pink and productive with brown liquid stool in bag. Drain output is light pink but slightly opaque- similar to what it has been.  Ext: warm, no edema Neuro: grossly normal  Lines/tubes/drains: PIV, drain  A/P:  POD 4 lap assisted sigmoidectomy with diverting loop ileostomy.  -Post op ileus (gastric)- seems to be resolving. Ostomy productive, a little more distended today. Carb limited diet, continue boost today.  -Acute kidney injury on chronic kidney disease- Creatinine downtrending. Urine output ok. Continue IVF@75 .Avoid any nephrotoxic agents/ nsaids other than baby aspirin/ recheck tomorrow. Monitor I&Os strictly- risk of fluid overload with hx CHF and vascular disease. -Atelectasis- aggressive pulm toilet, encouraged pt to use IS 10x per hour and explained reasoning -Continue current pain regimen.  -Continue to mobilize/ out of bed as much as possible  -PVD/CAD- continue ASA 81, imdur, coreg -Insulin dependent diabetes- continue sliding scale, restart home long-acting insulin -HTN- home coreg and imdur ordered, add meds one at a time if needed. May resume one today, has had a  couple high readings -PVD/ CAD- continue Asa 81   Potential discharge this weekend vs [place], MD [facility], [place] Pager [phone]

## 2018-01-17 NOTE — Consult Note (Signed)
[place] Nurse ostomy follow up To patient's room 1522 for f/u teaching.  Patient correctly stated all steps of pouch change process, and correctly demonstrated burping/empyting pouch.  Supplies at bedside.  Additional supplies have been requested.  I will ask one of my team members to see the patient early Monday morning to be stand by assist while the patient performs pouch change process.  Patient has agreed to perform pouch change with nursing assistance over the weekend, if needed.  Patient states he will likely be discharged on Monday. Val Riles, RN, MSN, CWOCN, CNS-BC, pager [phone]

## 2018-01-17 NOTE — Progress Notes (Signed)
OT Cancellation Note  Patient Details Name: [name] MRN: [id number] DOB: 07-Feb-1956   Cancelled Treatment:    Reason Eval/Treat Not Completed: Other (comment)   Pt is working with PT.  Will check later or another day. Tametha Banning 01/17/2018, 1:38 PM  Lesle Chris, OTR/L [phone] 01/17/2018

## 2018-01-17 NOTE — Progress Notes (Signed)
Physical Therapy Treatment Patient Details Name: [name] MRN: [id number] DOB: 10/15/1955 Today's Date: 01/17/2018    History of Present Illness sigmoidectomy with diverting loop ileostomy    PT Comments    POD # 4 Assisted OOB to amb a greater distance in hallway.  Pt doing VERY well with his mobility after surgery.    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 Restrictions Weight Bearing Restrictions: No    Mobility  Bed Mobility Overal bed mobility: Modified Independent             General bed mobility comments: able to self perform  Transfers Overall transfer level: Needs assistance Equipment used: Rolling walker (2 wheeled) Transfers: Sit to/from Stand Sit to Stand: Supervision;Min guard Stand pivot transfers: Supervision;Min guard       General transfer comment: for drain/line management and for safety otherwise pt able to self perform  Ambulation/Gait Ambulation/Gait assistance: Min guard;Supervision Ambulation Distance (Feet): 500 Feet Assistive device: Rolling walker (2 wheeled) Gait Pattern/deviations: Step-through pattern;Decreased step length - right;Decreased step length - left;Trunk flexed Gait velocity: decreased but safe   General Gait Details: slow, steady pace; good safety awareness  tolerated amb around unit   Stairs             Wheelchair Mobility    Modified Rankin (Stroke Patients Only)       Balance                                            Cognition Arousal/Alertness: Awake/alert Behavior During Therapy: WFL for tasks assessed/performed Overall Cognitive Status:  Within Functional Limits for tasks assessed                                        Exercises      General Comments        Pertinent Vitals/Pain Pain Assessment: Faces Faces Pain Scale: Hurts little more Pain Location: ABD with activity Pain Descriptors / Indicators: Grimacing;Aching Pain Intervention(s): Monitored during session;Repositioned    Home Living                      Prior Function            PT Goals (current goals can now be found in the care plan section) Progress towards PT goals: Progressing toward goals    Frequency    Min 3X/week      PT Plan Current plan remains appropriate    Co-evaluation              AM-PAC PT "6 Clicks" Daily Activity  Outcome Measure  Difficulty turning over in bed (including adjusting bedclothes, sheets and blankets)?: A Little Difficulty moving from lying on back to sitting on the side of the bed? : A Little Difficulty sitting down on and standing up from a chair with arms (e.g., wheelchair, bedside commode, etc,.)?: A Little Help needed moving to and from a bed to chair (including a wheelchair)?: A Little  Help needed walking in hospital room?: A Little Help needed climbing 3-5 steps with a railing? : A Lot 6 Click Score: 17    End of Session Equipment Utilized During Treatment: Gait belt Activity Tolerance: Patient tolerated treatment well Patient left: in chair;with call bell/phone within reach Nurse Communication: Mobility status PT Visit Diagnosis: Unsteadiness on feet (R26.81);Other abnormalities of gait and mobility (R26.89);Difficulty in walking, not elsewhere classified (R26.2);Muscle weakness (generalized) (M62.81)     Time: 1325-1350 PT Time Calculation (min) (ACUTE ONLY): 25 min  Charges:  $Gait Training: 8-22 mins $Therapeutic Activity: 8-22 mins                    G Codes:       Rica Koyanagi  PTA [facility]  Acute  Rehab Pager      [phone]

## 2018-01-17 NOTE — Progress Notes (Signed)
Pt c/o sever pain on his abdomen. Administered PRN IV dilaudid. Medication not effective. Pt's abdomen noted tight and distended, From his J/P  lots of serosanguineous clear fluid draining. Within 2 hours 500 ml  Drained. Paged oncall DR Hassell Done . MD called  back, explained everything to MD and MD changed PRN dilaudid frequency. Pt is not feeling any nausea.  Will continue to monitor.

## 2018-01-17 NOTE — Progress Notes (Signed)
Walked in to give report to night shift nurse at bedside, and patient stated that he had just "emptied his JP drain 4 times in 20 minutes." He pointed to his urinal, which he had also urinated in, and there was about 764mL of fluid in there. Per patient, 400 of it was urine and 300 was from the JP drain. JP drain was charged with about 45mL of fluid in there. RN emptied and recharged drain. Oncoming nurse aware and will monitor.

## 2018-01-18 ENCOUNTER — Inpatient Hospital Stay (HOSPITAL_COMMUNITY): Payer: 59

## 2018-01-18 LAB — CBC WITH DIFFERENTIAL/PLATELET
Basophils Absolute: 0 10*3/uL (ref 0.0–0.1)
Basophils Relative: 0 %
Eosinophils Absolute: 0.3 10*3/uL (ref 0.0–0.7)
Eosinophils Relative: 3 %
HCT: 26.9 % — ABNORMAL LOW (ref 39.0–52.0)
Hemoglobin: 8.7 g/dL — ABNORMAL LOW (ref 13.0–17.0)
Lymphocytes Relative: 11 %
Lymphs Abs: 1 10*3/uL (ref 0.7–4.0)
MCH: 26.5 pg (ref 26.0–34.0)
MCHC: 32.3 g/dL (ref 30.0–36.0)
MCV: 82 fL (ref 78.0–100.0)
Monocytes Absolute: 0.5 10*3/uL (ref 0.1–1.0)
Monocytes Relative: 6 %
Neutro Abs: 7.1 10*3/uL (ref 1.7–7.7)
Neutrophils Relative %: 80 %
Platelets: 278 10*3/uL (ref 150–400)
RBC: 3.28 MIL/uL — ABNORMAL LOW (ref 4.22–5.81)
RDW: 13.7 % (ref 11.5–15.5)
WBC: 8.9 10*3/uL (ref 4.0–10.5)

## 2018-01-18 LAB — BASIC METABOLIC PANEL
Anion gap: 8 (ref 5–15)
Anion gap: 9 (ref 5–15)
BUN: 16 mg/dL (ref 6–20)
BUN: 15 mg/dL (ref 6–20)
CO2: 24 mmol/L (ref 22–32)
CO2: 24 mmol/L (ref 22–32)
Calcium: 7.9 mg/dL — ABNORMAL LOW (ref 8.9–10.3)
Calcium: 7.9 mg/dL — ABNORMAL LOW (ref 8.9–10.3)
Chloride: 106 mmol/L (ref 101–111)
Chloride: 106 mmol/L (ref 101–111)
Creatinine, Ser: 1.47 mg/dL — ABNORMAL HIGH (ref 0.61–1.24)
Creatinine, Ser: 1.31 mg/dL — ABNORMAL HIGH (ref 0.61–1.24)
GFR calc Af Amer: 57 mL/min — ABNORMAL LOW (ref >60)
GFR calc Af Amer: >60 mL/min (ref >60)
GFR calc non Af Amer: 49 mL/min — ABNORMAL LOW (ref >60)
GFR calc non Af Amer: 56 mL/min — ABNORMAL LOW (ref >60)
Glucose, Bld: 182 mg/dL — ABNORMAL HIGH (ref 65–99)
Glucose, Bld: 105 mg/dL — ABNORMAL HIGH (ref 65–99)
Potassium: 4.2 mmol/L (ref 3.5–5.1)
Potassium: 3.7 mmol/L (ref 3.5–5.1)
Sodium: 138 mmol/L (ref 135–145)
Sodium: 139 mmol/L (ref 135–145)

## 2018-01-18 LAB — GLUCOSE, CAPILLARY
Glucose-Capillary: 144 mg/dL — ABNORMAL HIGH (ref 65–99)
Glucose-Capillary: 113 mg/dL — ABNORMAL HIGH (ref 65–99)
Glucose-Capillary: 106 mg/dL — ABNORMAL HIGH (ref 65–99)
Glucose-Capillary: 86 mg/dL (ref 65–99)

## 2018-01-18 LAB — CREATININE, FLUID (PLEURAL, PERITONEAL, JP DRAINAGE): Creat, Fluid: 1.5 mg/dL

## 2018-01-18 MED ORDER — INSULIN ASPART 100 UNIT/ML ~~LOC~~ SOLN
0.0000 [IU] | Freq: Three times a day (TID) | SUBCUTANEOUS | Status: DC
  Administered 2018-01-20 (×2): 1 [IU] via SUBCUTANEOUS
  Administered 2018-01-21 – 2018-01-22 (×4): 2 [IU] via SUBCUTANEOUS
  Administered 2018-01-22: 3 [IU] via SUBCUTANEOUS
  Administered 2018-01-22 – 2018-01-24 (×5): 2 [IU] via SUBCUTANEOUS

## 2018-01-18 MED ORDER — ACETAMINOPHEN 325 MG PO TABS
650.0000 mg | ORAL_TABLET | Freq: Four times a day (QID) | ORAL | Status: DC
  Administered 2018-01-18 (×3): 650 mg via ORAL
  Filled 2018-01-18 (×3): qty 2

## 2018-01-18 MED ORDER — LORAZEPAM 2 MG/ML IJ SOLN
0.5000 mg | Freq: Once | INTRAMUSCULAR | Status: AC
  Administered 2018-01-18: 0.5 mg via INTRAVENOUS
  Filled 2018-01-18: qty 1

## 2018-01-18 MED ORDER — FAMOTIDINE IN NACL 20-0.9 MG/50ML-% IV SOLN
20.0000 mg | Freq: Two times a day (BID) | INTRAVENOUS | Status: DC
  Administered 2018-01-18 – 2018-01-22 (×9): 20 mg via INTRAVENOUS
  Filled 2018-01-18 (×9): qty 50

## 2018-01-18 NOTE — Progress Notes (Signed)
Informed patient that he cannot have anything by mouth and that he will be getting another NG tube inserted. Patient wanting to "go downstairs for it". Call made to Dr Marlou Starks and he has ordered a one time dose of Ativan for NG insertion. Bri, RN is planning to Ship broker with insertion. Donne Hazel, RN

## 2018-01-18 NOTE — Progress Notes (Signed)
Patient ID: [name], male   DOB: November 16, 1955, 62 y.o.   MRN: [id number] [facility] Surgery Progress Note:   5 Days Post-Op  Subjective: Mental status is alert;  Complaining of abdominal distension Objective: Vital signs in last 24 hours: Temp:  [98.8 F (37.1 C)-100.1 F (37.8 C)] 99.9 F (37.7 C) (05/11 0559) Pulse Rate:  [68-83] 75 (05/11 0559) Resp:  [16-18] 16 (05/11 0559) BP: (138-170)/(70-78) 138/78 (05/11 0559) SpO2:  [97 %-98 %] 97 % (05/11 0559)  Intake/Output from previous day: 05/10 0701 - 05/11 0700 In: 2180 [P.O.:680; I.V.:1500] Out: 2510 [Urine:750; Emesis/NG output:850; Drains:810; Stool:100] Intake/Output this shift: No intake/output data recorded.  Physical Exam: Work of breathing is not labored;  Sitting on the edge of the bed.  Moderate serous JP drainage;  Ileostomy bag with brown liquid  Lab Results:  Results for orders placed or performed during the hospital encounter of 01/13/18 (from the past 48 hour(s))  Glucose, capillary     Status: Abnormal   Collection Time: 01/16/18 11:46 AM  Result Value Ref Range   Glucose-Capillary 216 (H) 65 - 99 mg/dL  Glucose, capillary     Status: Abnormal   Collection Time: 01/16/18  4:43 PM  Result Value Ref Range   Glucose-Capillary 163 (H) 65 - 99 mg/dL  Glucose, capillary     Status: Abnormal   Collection Time: 01/16/18  8:35 PM  Result Value Ref Range   Glucose-Capillary 243 (H) 65 - 99 mg/dL  Basic metabolic panel     Status: Abnormal   Collection Time: 01/17/18  4:10 AM  Result Value Ref Range   Sodium 135 135 - 145 mmol/L   Potassium 3.5 3.5 - 5.1 mmol/L   Chloride 103 101 - 111 mmol/L   CO2 23 22 - 32 mmol/L   Glucose, Bld 197 (H) 65 - 99 mg/dL   BUN 21 (H) 6 - 20 mg/dL   Creatinine, Ser 1.70 (H) 0.61 - 1.24 mg/dL   Calcium 7.4 (L) 8.9 - 10.3 mg/dL   GFR calc non Af Amer 41 (L) >60 mL/min   GFR calc Af Amer 48 (L) >60 mL/min    Comment: (NOTE) The eGFR has been calculated using the CKD EPI  equation. This calculation has not been validated in all clinical situations. eGFR's persistently <60 mL/min signify possible Chronic Kidney Disease.    Anion gap 9 5 - 15    Comment: Performed at [facility], [place] [address] [place], [place] [address]  Glucose, capillary     Status: Abnormal   Collection Time: 01/17/18  7:45 AM  Result Value Ref Range   Glucose-Capillary 178 (H) 65 - 99 mg/dL  Glucose, capillary     Status: Abnormal   Collection Time: 01/17/18 11:58 AM  Result Value Ref Range   Glucose-Capillary 206 (H) 65 - 99 mg/dL  Glucose, capillary     Status: Abnormal   Collection Time: 01/17/18  4:41 PM  Result Value Ref Range   Glucose-Capillary 173 (H) 65 - 99 mg/dL  Glucose, capillary     Status: Abnormal   Collection Time: 01/17/18 10:21 PM  Result Value Ref Range   Glucose-Capillary 188 (H) 65 - 99 mg/dL  Creatinine, fluid (JP Drainage)     Status: None   Collection Time: 01/18/18  2:14 AM  Result Value Ref Range   Creat, Fluid 1.5 mg/dL    Comment: (NOTE) No normal range established for this test Results should be evaluated in conjunction  with serum values Performed at [facility] [address], [place], [place] [address]    Fluid Type-FCRE JP DRAINAGE     Comment: Performed at [facility], [place] [address], [place], [place] [address]  Basic metabolic panel     Status: Abnormal   Collection Time: 01/18/18  4:08 AM  Result Value Ref Range   Sodium 138 135 - 145 mmol/L   Potassium 4.2 3.5 - 5.1 mmol/L   Chloride 106 101 - 111 mmol/L   CO2 24 22 - 32 mmol/L   Glucose, Bld 182 (H) 65 - 99 mg/dL   BUN 16 6 - 20 mg/dL   Creatinine, Ser 1.47 (H) 0.61 - 1.24 mg/dL   Calcium 7.9 (L) 8.9 - 10.3 mg/dL   GFR calc non Af Amer 49 (L) >60 mL/min   GFR calc Af Amer 57 (L) >60 mL/min    Comment: (NOTE) The eGFR has been calculated using the CKD EPI equation. This calculation has not been validated in all clinical  situations. eGFR's persistently <60 mL/min signify possible Chronic Kidney Disease.    Anion gap 8 5 - 15    Comment: Performed at [facility], [place] [address], [place], [place] [address]  Glucose, capillary     Status: Abnormal   Collection Time: 01/18/18  8:09 AM  Result Value Ref Range   Glucose-Capillary 144 (H) 65 - 99 mg/dL    Radiology/Results: No results found.  Anti-infectives: Anti-infectives (From admission, onward)   Start     Dose/Rate Route Frequency Ordered Stop   01/14/18 0000  cefoTEtan (CEFOTAN) 2 g in sodium chloride 0.9 % 100 mL IVPB     2 g 200 mL/hr over 30 Minutes Intravenous Every 12 hours 01/13/18 1735 01/14/18 1211   01/13/18 1006  cefoTEtan in Dextrose 5% (CEFOTAN) IVPB 2 g     2 g Intravenous On call to O.R. 01/13/18 1006 01/13/18 1210   01/13/18 1006  neomycin (MYCIFRADIN) tablet 1,000 mg  Status:  Discontinued     1,000 mg Oral 3 times per day 01/13/18 1006 01/13/18 1008   01/13/18 1006  metroNIDAZOLE (FLAGYL) tablet 1,000 mg  Status:  Discontinued     1,000 mg Oral 3 times per day 01/13/18 1006 01/13/18 1008      Assessment/Plan: Problem List: Patient Active Problem List   Diagnosis Date Noted  . Diverticulitis of large intestine with perforation and abscess 01/13/2018  . Intra-abdominal abscess ([place]) 11/04/2017  . Diverticulitis 11/04/2017  . Chronic venous hypertension (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 ([place]) 12/11/2016  . Non-pressure chronic ulcer of other part of left foot limited to breakdown of skin ([place]) 11/12/2016  . Unilateral primary osteoarthritis, left knee 10/11/2016  . Midfoot ulcer, left, limited to breakdown of skin ([place]) 10/11/2016  . Hypertension 07/30/2016  . History of right below knee amputation ([place]) 07/12/2016  . Poorly controlled type 2 diabetes mellitus with peripheral neuropathy ([place]) 05/09/2016  . Pressure ulcer 05/08/2016  . Diabetic foot infection ([place]) 05/04/2016   . Diabetic foot ([place]) 05/04/2016  . Type 2 diabetes mellitus with insulin therapy ([place]) 12/16/2015  . Amputated toe ([place]) 10/21/2015  . Diabetic foot ulcer ([place]) 10/12/2015  . Leg edema, left 09/03/2015  . Acute renal failure ([place]) 09/03/2015  . CAD (dz of distal, mid and proximal RCA with implantation of 3 overlapping drug-eluting stent,) 09/03/2015  . Chronic diastolic heart failure, NYHA class 2 ([place]) 09/03/2015  . Opacity  of lung on imaging study 01/14/2015  . Angina pectoris associated with type 2 diabetes mellitus ([place]) 10/28/2014  . Hyperlipidemia 08/25/2014  . Chronic total occlusion of artery of the extremities ([place]) 04/08/2012  . Onychomycosis 02/01/2012  . Diabetes mellitus, insulin dependent (IDDM), controlled ([place]) 11/01/2011  . Occlusion and stenosis of carotid artery without mention of cerebral infarction 06/12/2011  . Aftercare following surgery of the circulatory system, [place] 06/12/2011  . GERD (gastroesophageal reflux disease) 05/08/2011  . Barrett's esophagus without dysplasia 05/08/2011  . PVD (peripheral vascular disease) ([place]) 02/01/2011  . HTN (hypertension) 02/01/2011  . Former tobacco use 02/01/2011    Will check xrays today and order lab for in the AM.  This is a much older than stated age man with advanced vascular disease and is considered higher risk for complications after surgery.    5 Days Post-Op    LOS: 5 days   Matt B. Hassell Done, MD, [facility] [phone] beeper [phone]  01/18/2018 9:39 AM

## 2018-01-18 NOTE — Progress Notes (Signed)
Called to patient's room. Patient states Dr Hassell Done "just got off of the phone with my wife and he has been trying to call for an update but can't get through at the desk". Patient requested I call Dr Hassell Done and give him and update. Paged Dr Hassell Done who stated it was not him. Dr Marlou Starks paged. Donne Hazel, RN

## 2018-01-19 LAB — GLUCOSE, CAPILLARY
Glucose-Capillary: 71 mg/dL (ref 65–99)
Glucose-Capillary: 62 mg/dL — ABNORMAL LOW (ref 65–99)
Glucose-Capillary: 132 mg/dL — ABNORMAL HIGH (ref 65–99)
Glucose-Capillary: 88 mg/dL (ref 65–99)
Glucose-Capillary: 80 mg/dL (ref 65–99)

## 2018-01-19 LAB — CBC WITH DIFFERENTIAL/PLATELET
Basophils Absolute: 0 10*3/uL (ref 0.0–0.1)
Basophils Relative: 0 %
Eosinophils Absolute: 0.3 10*3/uL (ref 0.0–0.7)
Eosinophils Relative: 3 %
HCT: 25.5 % — ABNORMAL LOW (ref 39.0–52.0)
Hemoglobin: 8.6 g/dL — ABNORMAL LOW (ref 13.0–17.0)
Lymphocytes Relative: 12 %
Lymphs Abs: 1.1 10*3/uL (ref 0.7–4.0)
MCH: 27.4 pg (ref 26.0–34.0)
MCHC: 33.7 g/dL (ref 30.0–36.0)
MCV: 81.2 fL (ref 78.0–100.0)
Monocytes Absolute: 0.7 10*3/uL (ref 0.1–1.0)
Monocytes Relative: 7 %
Neutro Abs: 6.9 10*3/uL (ref 1.7–7.7)
Neutrophils Relative %: 78 %
Platelets: 284 10*3/uL (ref 150–400)
RBC: 3.14 MIL/uL — ABNORMAL LOW (ref 4.22–5.81)
RDW: 13.8 % (ref 11.5–15.5)
WBC: 8.9 10*3/uL (ref 4.0–10.5)

## 2018-01-19 LAB — BASIC METABOLIC PANEL
Anion gap: 11 (ref 5–15)
BUN: 12 mg/dL (ref 6–20)
CO2: 21 mmol/L — ABNORMAL LOW (ref 22–32)
Calcium: 7.8 mg/dL — ABNORMAL LOW (ref 8.9–10.3)
Chloride: 106 mmol/L (ref 101–111)
Creatinine, Ser: 1.21 mg/dL (ref 0.61–1.24)
GFR calc Af Amer: >60 mL/min (ref >60)
GFR calc non Af Amer: >60 mL/min (ref >60)
Glucose, Bld: 76 mg/dL (ref 65–99)
Potassium: 3.5 mmol/L (ref 3.5–5.1)
Sodium: 138 mmol/L (ref 135–145)

## 2018-01-19 MED ORDER — DEXTROSE 50 % IV SOLN
INTRAVENOUS | Status: AC
  Administered 2018-01-19: 50 mL
  Filled 2018-01-19: qty 50

## 2018-01-19 NOTE — Progress Notes (Signed)
Patient ID: [name], male   DOB: 06/17/1956, 62 y.o.   MRN: [id number] [facility] Progress Note:   6 Days Post-Op  Subjective: Mental status is is clear and he is conversant.  He is feeling better with his NG tube. Objective: Vital signs in last 24 hours: Temp:  [98.7 F (37.1 C)-99.2 F (37.3 C)] 98.7 F (37.1 C) (05/12 0628) Pulse Rate:  [61-72] 71 (05/12 0628) Resp:  [18] 18 (05/12 0628) BP: (129-193)/(62-78) 193/75 (05/12 0628) SpO2:  [96 %-97 %] 96 % (05/12 0628)  Intake/Output from previous day: 05/11 0701 - 05/12 0700 In: 1905 [I.V.:1800; IV Piggyback:105] Out: 3365 [Urine:1200; Emesis/NG output:875; Drains:340; Stool:950] Intake/Output this shift: Total I/O In: -  Out: 500 [Urine:500]  Physical Exam: Work of breathing is nonlabored.  The abdominal distention all more on the left is decreased from yesterday.  Ileostomy output brown  Lab Results:  Results for orders placed or performed during the hospital encounter of 01/13/18 (from the past 48 hour(s))  Glucose, capillary     Status: Abnormal   Collection Time: 01/17/18 11:58 AM  Result Value Ref Range   Glucose-Capillary 206 (H) 65 - 99 mg/dL  Glucose, capillary     Status: Abnormal   Collection Time: 01/17/18  4:41 PM  Result Value Ref Range   Glucose-Capillary 173 (H) 65 - 99 mg/dL  Glucose, capillary     Status: Abnormal   Collection Time: 01/17/18 10:21 PM  Result Value Ref Range   Glucose-Capillary 188 (H) 65 - 99 mg/dL  Creatinine, fluid (JP Drainage)     Status: None   Collection Time: 01/18/18  2:14 AM  Result Value Ref Range   Creat, Fluid 1.5 mg/dL    Comment: (NOTE) No normal range established for this test Results should be evaluated in conjunction with serum values Performed at [facility], [address] [place], [place] [address]    Fluid Type-FCRE JP DRAINAGE     Comment: Performed at [facility], [place] [address], [place], [place] [address]  Basic  metabolic panel     Status: Abnormal   Collection Time: 01/18/18  4:08 AM  Result Value Ref Range   Sodium 138 135 - 145 mmol/L   Potassium 4.2 3.5 - 5.1 mmol/L   Chloride 106 101 - 111 mmol/L   CO2 24 22 - 32 mmol/L   Glucose, Bld 182 (H) 65 - 99 mg/dL   BUN 16 6 - 20 mg/dL   Creatinine, Ser 1.47 (H) 0.61 - 1.24 mg/dL   Calcium 7.9 (L) 8.9 - 10.3 mg/dL   GFR calc non Af Amer 49 (L) >60 mL/min   GFR calc Af Amer 57 (L) >60 mL/min    Comment: (NOTE) The eGFR has been calculated using the CKD EPI equation. This calculation has not been validated in all clinical situations. eGFR's persistently <60 mL/min signify possible Chronic Kidney Disease.    Anion gap 8 5 - 15    Comment: Performed at [facility], [place] [address], [place], [place] [address]  Glucose, capillary     Status: Abnormal   Collection Time: 01/18/18  8:09 AM  Result Value Ref Range   Glucose-Capillary 144 (H) 65 - 99 mg/dL  Glucose, capillary     Status: Abnormal   Collection Time: 01/18/18 12:03 PM  Result Value Ref Range   Glucose-Capillary 113 (H) 65 - 99 mg/dL  CBC with Differential/Platelet     Status: Abnormal  Collection Time: 01/18/18  3:44 PM  Result Value Ref Range   WBC 8.9 4.0 - 10.5 K/uL   RBC 3.28 (L) 4.22 - 5.81 MIL/uL   Hemoglobin 8.7 (L) 13.0 - 17.0 g/dL   HCT 26.9 (L) 39.0 - 52.0 %   MCV 82.0 78.0 - 100.0 fL   MCH 26.5 26.0 - 34.0 pg   MCHC 32.3 30.0 - 36.0 g/dL   RDW 13.7 11.5 - 15.5 %   Platelets 278 150 - 400 K/uL   Neutrophils Relative % 80 %   Neutro Abs 7.1 1.7 - 7.7 K/uL   Lymphocytes Relative 11 %   Lymphs Abs 1.0 0.7 - 4.0 K/uL   Monocytes Relative 6 %   Monocytes Absolute 0.5 0.1 - 1.0 K/uL   Eosinophils Relative 3 %   Eosinophils Absolute 0.3 0.0 - 0.7 K/uL   Basophils Relative 0 %   Basophils Absolute 0.0 0.0 - 0.1 K/uL    Comment: Performed at [facility], [place] [address], [place], [place] [address]  Basic metabolic panel     Status:  Abnormal   Collection Time: 01/18/18  3:44 PM  Result Value Ref Range   Sodium 139 135 - 145 mmol/L   Potassium 3.7 3.5 - 5.1 mmol/L   Chloride 106 101 - 111 mmol/L   CO2 24 22 - 32 mmol/L   Glucose, Bld 105 (H) 65 - 99 mg/dL   BUN 15 6 - 20 mg/dL   Creatinine, Ser 1.31 (H) 0.61 - 1.24 mg/dL   Calcium 7.9 (L) 8.9 - 10.3 mg/dL   GFR calc non Af Amer 56 (L) >60 mL/min   GFR calc Af Amer >60 >60 mL/min    Comment: (NOTE) The eGFR has been calculated using the CKD EPI equation. This calculation has not been validated in all clinical situations. eGFR's persistently <60 mL/min signify possible Chronic Kidney Disease.    Anion gap 9 5 - 15    Comment: Performed at [facility], [place] [address] [place], [place] [address]  Glucose, capillary     Status: Abnormal   Collection Time: 01/18/18  4:33 PM  Result Value Ref Range   Glucose-Capillary 106 (H) 65 - 99 mg/dL  Glucose, capillary     Status: None   Collection Time: 01/18/18  9:48 PM  Result Value Ref Range   Glucose-Capillary 86 65 - 99 mg/dL  CBC with Differential/Platelet     Status: Abnormal   Collection Time: 01/19/18  4:09 AM  Result Value Ref Range   WBC 8.9 4.0 - 10.5 K/uL   RBC 3.14 (L) 4.22 - 5.81 MIL/uL   Hemoglobin 8.6 (L) 13.0 - 17.0 g/dL   HCT 25.5 (L) 39.0 - 52.0 %   MCV 81.2 78.0 - 100.0 fL   MCH 27.4 26.0 - 34.0 pg   MCHC 33.7 30.0 - 36.0 g/dL   RDW 13.8 11.5 - 15.5 %   Platelets 284 150 - 400 K/uL   Neutrophils Relative % 78 %   Neutro Abs 6.9 1.7 - 7.7 K/uL   Lymphocytes Relative 12 %   Lymphs Abs 1.1 0.7 - 4.0 K/uL   Monocytes Relative 7 %   Monocytes Absolute 0.7 0.1 - 1.0 K/uL   Eosinophils Relative 3 %   Eosinophils Absolute 0.3 0.0 - 0.7 K/uL   Basophils Relative 0 %   Basophils Absolute 0.0 0.0 - 0.1 K/uL    Comment: Performed at [facility], [place] [address] [place] [place], [place] [address]  Basic metabolic panel     Status: Abnormal   Collection Time: 01/19/18   4:09 AM  Result Value Ref Range   Sodium 138 135 - 145 mmol/L   Potassium 3.5 3.5 - 5.1 mmol/L   Chloride 106 101 - 111 mmol/L   CO2 21 (L) 22 - 32 mmol/L   Glucose, Bld 76 65 - 99 mg/dL   BUN 12 6 - 20 mg/dL   Creatinine, Ser 1.21 0.61 - 1.24 mg/dL   Calcium 7.8 (L) 8.9 - 10.3 mg/dL   GFR calc non Af Amer >60 >60 mL/min   GFR calc Af Amer >60 >60 mL/min    Comment: (NOTE) The eGFR has been calculated using the CKD EPI equation. This calculation has not been validated in all clinical situations. eGFR's persistently <60 mL/min signify possible Chronic Kidney Disease.    Anion gap 11 5 - 15    Comment: Performed at [facility], [place] [address] [place], [place] [address]  Glucose, capillary     Status: None   Collection Time: 01/19/18  7:36 AM  Result Value Ref Range   Glucose-Capillary 71 65 - 99 mg/dL    Radiology/Results: Dg Abd 1 View  Result Date: 01/18/2018 CLINICAL DATA:  Check nasogastric catheter placement EXAM: ABDOMEN - 1 VIEW COMPARISON:  Film from earlier in the same day. FINDINGS: Nasogastric catheter has now been placed within the stomach. There remains mild small bowel dilatation. Degenerative changes of lumbar spine are noted. IMPRESSION: Nasogastric catheter within the stomach. Electronically Signed   By: Inez Catalina M.D.   On: 01/18/2018 19:08   Dg Abd Acute W/chest  Result Date: 01/18/2018 CLINICAL DATA:  Ileus, history hypertension, diabetes mellitus, GERD, COPD, CHF EXAM: DG ABDOMEN ACUTE W/ 1V CHEST COMPARISON:  Chest radiograph 01/14/2018, abdominal radiograph 01/14/2018 FINDINGS: Enlargement of cardiac silhouette. Atherosclerotic calcification aorta. Lungs clear. No pleural effusion or pneumothorax. Interval removal of nasogastric tube. Prior lumbosacral fusion L4-S1. Diffuse dilatation of small bowel loops throughout the abdomen. Anterior skin clips at pelvis. Paucity of colonic gas. Findings favor postoperative ileus. No bowel wall  thickening or free air. Bones demineralized with scattered degenerative disc disease changes of the thoracolumbar spine. IMPRESSION: Probable postoperative ileus. Electronically Signed   By: Lavonia Dana M.D.   On: 01/18/2018 12:59    Anti-infectives: Anti-infectives (From admission, onward)   Start     Dose/Rate Route Frequency Ordered Stop   01/14/18 0000  cefoTEtan (CEFOTAN) 2 g in sodium chloride 0.9 % 100 mL IVPB     2 g 200 mL/hr over 30 Minutes Intravenous Every 12 hours 01/13/18 1735 01/14/18 1211   01/13/18 1006  cefoTEtan in Dextrose 5% (CEFOTAN) IVPB 2 g     2 g Intravenous On call to O.R. 01/13/18 1006 01/13/18 1210   01/13/18 1006  neomycin (MYCIFRADIN) tablet 1,000 mg  Status:  Discontinued     1,000 mg Oral 3 times per day 01/13/18 1006 01/13/18 1008   01/13/18 1006  metroNIDAZOLE (FLAGYL) tablet 1,000 mg  Status:  Discontinued     1,000 mg Oral 3 times per day 01/13/18 1006 01/13/18 1008      Assessment/Plan: Problem List: Patient Active Problem List   Diagnosis Date Noted  . Diverticulitis of large intestine with perforation and abscess 01/13/2018  . Intra-abdominal abscess ([place]) 11/04/2017  . Diverticulitis 11/04/2017  . Chronic venous hypertension (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 ([place]) 12/11/2016  . Non-pressure  chronic ulcer of other part of left foot limited to breakdown of skin ([place]) 11/12/2016  . Unilateral primary osteoarthritis, left knee 10/11/2016  . Midfoot ulcer, left, limited to breakdown of skin ([place]) 10/11/2016  . Hypertension 07/30/2016  . History of right below knee amputation ([place]) 07/12/2016  . Poorly controlled type 2 diabetes mellitus with peripheral neuropathy ([place]) 05/09/2016  . Pressure ulcer 05/08/2016  . Diabetic foot infection ([place]) 05/04/2016  . Diabetic foot ([place]) 05/04/2016  . Type 2 diabetes mellitus with insulin therapy ([place]) 12/16/2015  . Amputated toe ([place]) 10/21/2015  . Diabetic foot ulcer ([place])  10/12/2015  . Leg edema, left 09/03/2015  . Acute renal failure ([place]) 09/03/2015  . CAD (dz of distal, mid and proximal RCA with implantation of 3 overlapping drug-eluting stent,) 09/03/2015  . Chronic diastolic heart failure, NYHA class 2 ([place]) 09/03/2015  . Opacity of lung on imaging study 01/14/2015  . Angina pectoris associated with type 2 diabetes mellitus ([place]) 10/28/2014  . Hyperlipidemia 08/25/2014  . Chronic total occlusion of artery of the extremities ([place]) 04/08/2012  . Onychomycosis 02/01/2012  . Diabetes mellitus, insulin dependent (IDDM), controlled ([place]) 11/01/2011  . Occlusion and stenosis of carotid artery without mention of cerebral infarction 06/12/2011  . Aftercare following surgery of the circulatory system, [place] 06/12/2011  . GERD (gastroesophageal reflux disease) 05/08/2011  . Barrett's esophagus without dysplasia 05/08/2011  . PVD (peripheral vascular disease) ([place]) 02/01/2011  . HTN (hypertension) 02/01/2011  . Former tobacco use 02/01/2011    Continue NG suction and treatment for x-ray evidence and physical exam evidence of an ileus postop. 6 Days Post-Op    LOS: 6 days   Matt B. Hassell Done, MD, [facility] [phone] beeper [phone]  01/19/2018 9:31 AM

## 2018-01-19 NOTE — Progress Notes (Addendum)
Hypoglycemic Event  CBG: 62  Treatment: D50 X 1 amp   Symptoms: None  Follow-up CBG: Time:1145 CBG Result:132  Possible Reasons for Event: NPO  Comments/MD notified:Dr Hassell Done "Continue to monitor him".    Rhoda Waldvogel, Gordy Clement

## 2018-01-20 ENCOUNTER — Ambulatory Visit (INDEPENDENT_AMBULATORY_CARE_PROVIDER_SITE_OTHER): Payer: Medicare Other | Admitting: Orthopedic Surgery

## 2018-01-20 ENCOUNTER — Ambulatory Visit (INDEPENDENT_AMBULATORY_CARE_PROVIDER_SITE_OTHER): Payer: Medicare Other | Admitting: Specialist

## 2018-01-20 LAB — GLUCOSE, CAPILLARY
Glucose-Capillary: 114 mg/dL — ABNORMAL HIGH (ref 65–99)
Glucose-Capillary: 141 mg/dL — ABNORMAL HIGH (ref 65–99)
Glucose-Capillary: 132 mg/dL — ABNORMAL HIGH (ref 65–99)
Glucose-Capillary: 141 mg/dL — ABNORMAL HIGH (ref 65–99)

## 2018-01-20 MED ORDER — PANTOPRAZOLE SODIUM 40 MG PO TBEC
40.0000 mg | DELAYED_RELEASE_TABLET | Freq: Every day | ORAL | Status: DC
Start: 2018-01-20 — End: 2018-01-25
  Administered 2018-01-21 – 2018-01-25 (×5): 40 mg via ORAL
  Filled 2018-01-20 (×5): qty 1

## 2018-01-20 MED ORDER — HYDROMORPHONE HCL 2 MG PO TABS
2.0000 mg | ORAL_TABLET | Freq: Every evening | ORAL | Status: DC | PRN
  Administered 2018-01-20 – 2018-01-24 (×2): 2 mg via ORAL
  Filled 2018-01-20 (×2): qty 1

## 2018-01-20 MED ORDER — AMLODIPINE BESYLATE 5 MG PO TABS
5.0000 mg | ORAL_TABLET | Freq: Every day | ORAL | Status: DC
  Administered 2018-01-20: 5 mg via ORAL
  Filled 2018-01-20: qty 1

## 2018-01-20 MED ORDER — ROPINIROLE HCL 1 MG PO TABS
2.0000 mg | ORAL_TABLET | Freq: Every day | ORAL | Status: DC
  Administered 2018-01-20 – 2018-01-24 (×5): 2 mg via ORAL
  Filled 2018-01-20 (×5): qty 2

## 2018-01-20 MED ORDER — LISINOPRIL 20 MG PO TABS
20.0000 mg | ORAL_TABLET | Freq: Every day | ORAL | Status: DC
  Administered 2018-01-20 – 2018-01-25 (×6): 20 mg via ORAL
  Filled 2018-01-20 (×7): qty 1

## 2018-01-20 MED ORDER — AMLODIPINE-OLMESARTAN 5-20 MG PO TABS
1.0000 | ORAL_TABLET | Freq: Every evening | ORAL | Status: DC
Start: 2018-01-20 — End: 2018-01-20

## 2018-01-20 MED ORDER — PREGABALIN 100 MG PO CAPS
100.0000 mg | ORAL_CAPSULE | Freq: Three times a day (TID) | ORAL | Status: DC
  Administered 2018-01-20 – 2018-01-25 (×16): 100 mg via ORAL
  Filled 2018-01-20 (×16): qty 2

## 2018-01-20 MED ORDER — METOCLOPRAMIDE HCL 5 MG/ML IJ SOLN
10.0000 mg | Freq: Four times a day (QID) | INTRAMUSCULAR | Status: DC
  Administered 2018-01-20 – 2018-01-24 (×18): 10 mg via INTRAVENOUS
  Filled 2018-01-20 (×18): qty 2

## 2018-01-20 MED ORDER — FUROSEMIDE 10 MG/ML IJ SOLN
20.0000 mg | Freq: Once | INTRAMUSCULAR | Status: AC
  Administered 2018-01-20: 20 mg via INTRAVENOUS
  Filled 2018-01-20: qty 2

## 2018-01-20 MED ORDER — HYDRALAZINE HCL 20 MG/ML IJ SOLN: 10.0000 mg | INTRAMUSCULAR | Status: DC | PRN

## 2018-01-20 MED ORDER — OXYCODONE-ACETAMINOPHEN 5-325 MG PO TABS
1.0000 | ORAL_TABLET | Freq: Four times a day (QID) | ORAL | Status: DC | PRN
  Administered 2018-01-23 – 2018-01-25 (×5): 2 via ORAL
  Filled 2018-01-20 (×6): qty 2

## 2018-01-20 MED ORDER — ASPIRIN EC 81 MG PO TBEC
81.0000 mg | DELAYED_RELEASE_TABLET | Freq: Every day | ORAL | Status: DC
  Administered 2018-01-20 – 2018-01-25 (×6): 81 mg via ORAL
  Filled 2018-01-20 (×6): qty 1

## 2018-01-20 MED ORDER — TAMSULOSIN HCL 0.4 MG PO CAPS
0.4000 mg | ORAL_CAPSULE | Freq: Every day | ORAL | Status: DC
  Administered 2018-01-20 – 2018-01-25 (×6): 0.4 mg via ORAL
  Filled 2018-01-20 (×6): qty 1

## 2018-01-20 MED ORDER — ISOSORBIDE MONONITRATE ER 60 MG PO TB24
120.0000 mg | ORAL_TABLET | Freq: Every day | ORAL | Status: DC
  Administered 2018-01-20 – 2018-01-25 (×6): 120 mg via ORAL
  Filled 2018-01-20 (×7): qty 2

## 2018-01-20 MED ORDER — TORSEMIDE 20 MG PO TABS
20.0000 mg | ORAL_TABLET | Freq: Every morning | ORAL | Status: DC
  Administered 2018-01-20 – 2018-01-25 (×6): 20 mg via ORAL
  Filled 2018-01-20 (×6): qty 1

## 2018-01-20 MED ORDER — HYDROMORPHONE HCL 1 MG/ML IJ SOLN
1.0000 mg | INTRAMUSCULAR | Status: DC | PRN
  Administered 2018-01-20 – 2018-01-24 (×21): 1 mg via INTRAVENOUS
  Filled 2018-01-20 (×23): qty 1

## 2018-01-20 MED ORDER — CARVEDILOL 25 MG PO TABS
37.5000 mg | ORAL_TABLET | Freq: Two times a day (BID) | ORAL | Status: DC
  Administered 2018-01-20 – 2018-01-25 (×11): 37.5 mg via ORAL
  Filled 2018-01-20 (×11): qty 1

## 2018-01-20 MED ORDER — CARVEDILOL 25 MG PO TABS
37.5000 mg | ORAL_TABLET | Freq: Two times a day (BID) | ORAL | Status: DC
  Administered 2018-01-20: 37.5 mg via ORAL
  Filled 2018-01-20: qty 1

## 2018-01-20 MED ORDER — OXYCODONE-ACETAMINOPHEN 5-325 MG PO TABS
1.0000 | ORAL_TABLET | Freq: Four times a day (QID) | ORAL | Status: DC | PRN
Start: 2018-01-20 — End: 2018-01-20

## 2018-01-20 MED ORDER — IRBESARTAN 150 MG PO TABS
150.0000 mg | ORAL_TABLET | Freq: Every day | ORAL | Status: DC
  Administered 2018-01-20: 150 mg via ORAL
  Filled 2018-01-20: qty 1

## 2018-01-20 NOTE — Progress Notes (Addendum)
Patient complaining of swelling in arms and abdomen distended.  Right arm feels more swollen than left.  IV site with good blood return and flushes easily.  Lund sounds slightly diminished in bases but no crackles.  Other two RNs came in to help assess.  BP elevated again 194/90.  2nd dose this shift of Apresoline given.

## 2018-01-20 NOTE — Progress Notes (Signed)
Wife and son updated at bedside. He is having trouble getting pills down around the tube. Ostomy bag has been changed since this am (output not recorded) and there is brown liquid about 155mL plus gas in bag. Stoma is actively productive during this visit. NG is clamped (doing 4:1) and he is belching/ feeling nauseated.  Abdomen is soft, unchanged from this morning.  Incision with serous drainage from lower aspect, no erythema or purulence.  Drain output remains serous.  Await return of normal bowel function. If no improvement in next 24-28h will start TPN and plan CT scan.

## 2018-01-20 NOTE — Progress Notes (Signed)
PT Cancellation Note  Patient Details Name: [name] MRN: [id number] DOB: 06/22/1956   Cancelled Treatment:     attempted to see x 2 1. Am not feeling good, try this afternoon 2. PM nausea   Rica Koyanagi  PTA [facility]  Acute  Rehab Pager      [phone]

## 2018-01-20 NOTE — Progress Notes (Signed)
Occupational Therapy Treatment Patient Details Name: [name] MRN: [id number] DOB: 09-22-1955 Today's Date: 01/20/2018    History of present illness sigmoidectomy with diverting loop ileostomy   OT comments  Pt agreed to sit EOB but not get OOB  Follow Up Recommendations  No OT follow up    Equipment Recommendations  None recommended by OT       Precautions / Restrictions Precautions Precautions: Fall Precaution Comments: colostomy/drain Required Braces or Orthoses: Other Brace/Splint       Mobility Bed Mobility Overal bed mobility: Needs Assistance Bed Mobility: Supine to Sit;Sit to Supine     Supine to sit: Min assist Sit to supine: Min assist      Transfers     Transfers: Sit to/from Stand Sit to Stand: Min guard         General transfer comment: did not stand with OT        ADL either performed or assessed with clinical judgement   ADL Overall ADL's : Needs assistance/impaired Eating/Feeding: Sitting;Set up   Grooming: Set up;Sitting   Upper Body Bathing: Set up;Sitting       Upper Body Dressing : Set up;Sitting   Lower Body Dressing: Moderate assistance;Sitting/lateral leans       Toileting- Clothing Manipulation and Hygiene: Moderate assistance;Sitting/lateral lean         General ADL Comments: pt did agree to sitting EOB.  Pt complaining of nausea.  Pt does not want to get  OOB at this time but does want to walk with PT later     Vision Patient Visual Report: No change from baseline            Cognition Arousal/Alertness: Awake/alert Behavior During Therapy: WFL for tasks assessed/performed Overall Cognitive Status: Within Functional Limits for tasks assessed                                                     Pertinent Vitals/ Pain       Pain Score: 7  Pain Location: abdomen area Pain Descriptors / Indicators: Grimacing Pain Intervention(s): Monitored during session            Progress  Toward Goals  OT Goals(current goals can now be found in the care plan section)  Progress towards OT goals: Progressing toward goals     Plan Discharge plan remains appropriate       AM-PAC PT "6 Clicks" Daily Activity     Outcome Measure   Help from another person eating meals?: None Help from another person taking care of personal grooming?: A Little Help from another person toileting, which includes using toliet, bedpan, or urinal?: A Little Help from another person bathing (including washing, rinsing, drying)?: A Lot Help from another person to put on and taking off regular upper body clothing?: None Help from another person to put on and taking off regular lower body clothing?: A Lot 6 Click Score: 18    End of Session    OT Visit Diagnosis: Unsteadiness on feet (R26.81);Other abnormalities of gait and mobility (R26.89);Muscle weakness (generalized) (M62.81)   Activity Tolerance Patient limited by pain(RN gave meds during OT session)   Patient Left in bed;Other (comment);with bed alarm set(sitting EOB)   Nurse Communication Mobility status        Time: 1353-1405 OT Time Calculation (min): 12 min  Charges: OT General Charges $OT Visit: 1 Visit OT Treatments $Self Care/Home Management : 8-22 mins  [place], [place]   Betsy Pries 01/20/2018, 2:53 PM

## 2018-01-20 NOTE — Progress Notes (Addendum)
S: ileus over the weekend. Complains of swelling and concerned that he has not been receiving his home medications. Abdominal pain continues to improve.   Vitals, labs, intake/output, and orders reviewed at this time. Afebrile, no tachycardia. Hypertensive. No labs this am but Creatinine down to 1.2  Yesterday, WBC normal at 8.9, hgb slowly downtrending to 8.6. UOP 2100 yesterday (received lasix overnight), stool output 160, there is about 200 in the bag right now. 1050 NG output recorded, light in color. JP output 65 last 24h, down from 340 the day before and 810 overnight Friday, serous. JP creatinine was 1.5 on that night.   Gen: A&Ox3, no distress  H&N: EOMI, atraumatic, neck supple Chest: unlabored respirations, RRR Abd: soft, mildly tender, mildly distended, incision c/d/i with staples, no surrounding erythema or drainage. JP output serous. Ostomy pink with about 265mL of brown stool and gas in the bag.  Ext: warm, edema throughout Neuro: grossly normal  Lines/tubes/drains: PIV, Drain  A/P:  Pod 7 lap assisted sigmoidectomy with diverting loop ileostomy.  -Post op ileus (gastric)- NG in place, output light.Ostomy productive, a little distended today. Start scheduled reglan.  Chewing gum, hard candies. NG clamping trials, OK for sips of clears.  -Acute kidney injury on chronic kidney disease-Creatinine back to baseline. Urine output ok. Continue IVF@75  while he is not eating.Avoid any nephrotoxic agents/ nsaids other than baby aspirin/ recheck tomorrow. Monitor I&Os strictly- risk of fluid overload with hx CHF and vascular disease. -Atelectasis- aggressive pulm toilet, encouraged pt to use IS 10x per hour and explained reasoning -Continuecurrent pain regimen. He is on all of his home medications for pain. IV dilaudid for breakthrough. -Continue to mobilize/ out of bed as much as possible  -PVD/CAD- continue ASA 81, imdur, coreg -Insulin dependent diabetes- continue sliding  scale -HTN- Home meds restarted today (azor, coreg, imdur, torsemide, lisinopril) -PVD/ CAD- continue Asa 81, hold plavix until discharge  Dispo- awaiting resolution of ileus.    Romana Juniper, MD [facility], [place] Pager [phone]

## 2018-01-21 LAB — BASIC METABOLIC PANEL
Anion gap: 9 (ref 5–15)
BUN: 15 mg/dL (ref 6–20)
CO2: 23 mmol/L (ref 22–32)
Calcium: 8.1 mg/dL — ABNORMAL LOW (ref 8.9–10.3)
Chloride: 106 mmol/L (ref 101–111)
Creatinine, Ser: 1.34 mg/dL — ABNORMAL HIGH (ref 0.61–1.24)
GFR calc Af Amer: >60 mL/min (ref >60)
GFR calc non Af Amer: 55 mL/min — ABNORMAL LOW (ref >60)
Glucose, Bld: 149 mg/dL — ABNORMAL HIGH (ref 65–99)
Potassium: 3.2 mmol/L — ABNORMAL LOW (ref 3.5–5.1)
Sodium: 138 mmol/L (ref 135–145)

## 2018-01-21 LAB — GLUCOSE, CAPILLARY
Glucose-Capillary: 152 mg/dL — ABNORMAL HIGH (ref 65–99)
Glucose-Capillary: 190 mg/dL — ABNORMAL HIGH (ref 65–99)
Glucose-Capillary: 177 mg/dL — ABNORMAL HIGH (ref 65–99)
Glucose-Capillary: 161 mg/dL — ABNORMAL HIGH (ref 65–99)
Glucose-Capillary: 157 mg/dL — ABNORMAL HIGH (ref 65–99)

## 2018-01-21 LAB — CBC
HCT: 24.9 % — ABNORMAL LOW (ref 39.0–52.0)
Hemoglobin: 8.3 g/dL — ABNORMAL LOW (ref 13.0–17.0)
MCH: 26.8 pg (ref 26.0–34.0)
MCHC: 33.3 g/dL (ref 30.0–36.0)
MCV: 80.3 fL (ref 78.0–100.0)
Platelets: 306 10*3/uL (ref 150–400)
RBC: 3.1 MIL/uL — ABNORMAL LOW (ref 4.22–5.81)
RDW: 13.7 % (ref 11.5–15.5)
WBC: 8.7 10*3/uL (ref 4.0–10.5)

## 2018-01-21 LAB — MAGNESIUM: Magnesium: 1.4 mg/dL — ABNORMAL LOW (ref 1.7–2.4)

## 2018-01-21 MED ORDER — POTASSIUM CHLORIDE 10 MEQ/100ML IV SOLN
10.0000 meq | INTRAVENOUS | Status: AC
  Administered 2018-01-21 (×4): 10 meq via INTRAVENOUS

## 2018-01-21 MED ORDER — MAGNESIUM SULFATE 4 GM/100ML IV SOLN
4.0000 g | Freq: Once | INTRAVENOUS | Status: AC
  Administered 2018-01-21: 4 g via INTRAVENOUS
  Filled 2018-01-21: qty 100

## 2018-01-21 MED ORDER — POTASSIUM CHLORIDE 10 MEQ/100ML IV SOLN
10.0000 meq | INTRAVENOUS | Status: DC
  Filled 2018-01-21: qty 100

## 2018-01-21 NOTE — Progress Notes (Signed)
I know the patient well, I visited him socially.  I would be happy to get involved in his care if necessary.    Adrian Prows, MD 01/21/2018, 2:00 PM [facility] [place] Pager: [phone] Office: [phone] If no answer: Cell:  [phone]

## 2018-01-21 NOTE — Consult Note (Addendum)
[place] Nurse ostomy follow up Late entry:  Seen by [facility] nurse 01/20/18.  Pouch change completed.  Minimal assistance from patient.  He complains of fatigue.  He states he has been emptying all weekend.  Stoma type/location: RUQ ileostomy with large red rubber catheter in place for retention.  Stomal assessment/size: 1 3/4"  Peristomal assessment: intact Treatment options for stomal/peristomal skin: barrier ring.  I switched to convexity since he states his pouch leaked a few times over the weekend.  If this improves, will need convexity for discharge.  Output Liquid green effluent.  Ostomy pouching: 1pc.convexity Education provided: Patient is fatigued and does not want to participate.  He reluctantly cuts barrier to fit and verbalizes steps to emptying.  Enrolled patient in [place] Start Discharge program: Yes [place] team will continue to follow.  Domenic Moras RN BSN [place] Pager [phone]

## 2018-01-21 NOTE — Progress Notes (Signed)
Physical Therapy Treatment Patient Details Name: [name] MRN: [id number] DOB: 1956/06/10 Today's Date: 01/21/2018    History of Present Illness sigmoidectomy with diverting loop ileostomy post op ileus    PT Comments    POD #8 Sigmoidectomy as well as  ileus NG tube No nausea today.  "Yesterday was a bad day".  Assisted OOB to amb a greater distance in hallway.      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 Restrictions Weight Bearing Restrictions: No    Mobility  Bed Mobility Overal bed mobility: Needs Assistance Bed Mobility: Supine to Sit;Sit to Supine     Supine to sit: Supervision;Min guard Sit to supine: Supervision;Min guard   General bed mobility comments: able to self perform  Transfers Overall transfer level: Needs assistance Equipment used: Rolling walker (2 wheeled) Transfers: Sit to/from [facility] Sit to Stand: Supervision;Min guard Stand pivot transfers: Supervision;Min guard       General transfer comment: good safety cognition and use of hands to steady self  Ambulation/Gait Ambulation/Gait assistance: Min guard;Supervision Ambulation Distance (Feet): 550 Feet Assistive device: Rolling walker (2 wheeled) Gait Pattern/deviations: Step-through pattern;Decreased step length - right;Decreased step length - left;Trunk flexed Gait velocity: decreased but safe   General Gait Details: slow, steady pace; good safety awareness  tolerated amb around unit + 1/2   Stairs             Wheelchair Mobility    Modified Rankin (Stroke Patients Only)       Balance                                             Cognition Arousal/Alertness: Awake/alert Behavior During Therapy: WFL for tasks assessed/performed Overall Cognitive Status: Within Functional Limits for tasks assessed                                        Exercises      General Comments        Pertinent Vitals/Pain Pain Location: abdomen area Pain Descriptors / Indicators: Grimacing Pain Intervention(s): Monitored during session    Home Living                      Prior Function            PT Goals (current goals can now be found in the care plan section) Progress towards PT goals: Progressing toward goals    Frequency    Min 3X/week      PT Plan Current plan remains appropriate    Co-evaluation              AM-PAC PT "6 Clicks" Daily Activity  Outcome Measure  Difficulty turning over in bed (including adjusting bedclothes, sheets and blankets)?: A Little Difficulty moving from lying on back to sitting on the side of the bed? : A Little Difficulty sitting down on and standing up from a chair with arms (e.g., wheelchair, bedside commode, etc,.)?: A Little  Help needed moving to and from a bed to chair (including a wheelchair)?: A Little Help needed walking in hospital room?: A Little Help needed climbing 3-5 steps with a railing? : A Lot 6 Click Score: 17    End of Session Equipment Utilized During Treatment: Gait belt Activity Tolerance: Patient tolerated treatment well Patient left: in bed;with call bell/phone within reach Nurse Communication: Mobility status PT Visit Diagnosis: Unsteadiness on feet (R26.81);Other abnormalities of gait and mobility (R26.89);Difficulty in walking, not elsewhere classified (R26.2);Muscle weakness (generalized) (M62.81)     Time: 1415-1440 PT Time Calculation (min) (ACUTE ONLY): 25 min  Charges:  $Gait Training: 8-22 mins $Therapeutic Activity: 8-22 mins                    G Codes:       Rica Koyanagi   PTA [facility]  Acute  Rehab Pager      [phone]

## 2018-01-21 NOTE — Progress Notes (Signed)
S: Did well with clamping yesterday. Received one dose of dilaudid and one antiemetic overnight. Ostomy continues to be productive.   Vitals, labs, intake/output, and orders reviewed at this time. Afebrile, hypertensive, no tachycardia. Sats have been 99% on room air, last recording IS erroneus 72% on room air PO 440-->NG 1150 UOP 1450 Stool 450 JP 50 HYOPKALEMIA 3.2 HYPOMAGNESEMIA 1.4 Cr 1.35 (near baseline) WBC 8.7, HGB 8.3, PLT 306  Gen: A&Ox3, no distress  H&N: EOMI, atraumatic, neck supple Chest: unlabored respirations, RRR Abd: soft, non tender, nondistended, incision c/d/i with staples, small amount of serous drainage at inferior aspect adjacent to prior drain tract. Dressing has been changed once since yesterday. JP serous. Ostomy pink and productive with brown liquid stool and gas in bag. Ext: warm, mild edema improved from yesterday Neuro: grossly normal  Lines/tubes/drains: PIV, JP  A/P:  Pod 8 lap assisted sigmoidectomy with diverting loop ileostomy.  -Post op ileus (gastric)- NG in place, output light.Ostomyproductive, not distended today. Scheduled reglan. Chewing gum, hard candies. NG clamping trials will go for 6 hours clamped 1 hour suction, OK for sips of clears, continue strict I&O. Hypokalemia, hypomagnesemia- replace IV  -Acute kidney injury on chronic kidney disease-Creatinine back to baseline. Urine output ok.Continue IVF@75  while he is not eating.Avoid any nephrotoxic agents/ nsaids other than baby aspirin/ recheck tomorrow. Monitor I&Os strictly- risk of fluid overload with hx CHF and vascular disease. -Atelectasis- aggressive pulm toilet, encouraged pt to use IS 10x per hour and explained reasoning -Continuecurrent pain regimen. He is on all of his home medications for pain. IV dilaudid for breakthrough. -Continue to mobilize/ out of bed as much as possible  -PVD/CAD- continue ASA 81, imdur, coreg -Insulin dependent diabetes- continue sliding  scale -HTN- Home meds restarted today (coreg, imdur, torsemide, lisinopril) -PVD/ CAD- continue Asa 81, hold plavix until discharge  Dispo- awaiting resolution of ileus.      Romana Juniper, MD [facility], [place] Pager [phone]

## 2018-01-22 LAB — GLUCOSE, CAPILLARY
Glucose-Capillary: 213 mg/dL — ABNORMAL HIGH (ref 65–99)
Glucose-Capillary: 157 mg/dL — ABNORMAL HIGH (ref 65–99)
Glucose-Capillary: 155 mg/dL — ABNORMAL HIGH (ref 65–99)
Glucose-Capillary: 149 mg/dL — ABNORMAL HIGH (ref 65–99)

## 2018-01-22 LAB — BASIC METABOLIC PANEL
Anion gap: 9 (ref 5–15)
BUN: 10 mg/dL (ref 6–20)
CO2: 23 mmol/L (ref 22–32)
Calcium: 7.7 mg/dL — ABNORMAL LOW (ref 8.9–10.3)
Chloride: 103 mmol/L (ref 101–111)
Creatinine, Ser: 1.24 mg/dL (ref 0.61–1.24)
GFR calc Af Amer: >60 mL/min (ref >60)
GFR calc non Af Amer: >60 mL/min (ref >60)
Glucose, Bld: 196 mg/dL — ABNORMAL HIGH (ref 65–99)
Potassium: 3.2 mmol/L — ABNORMAL LOW (ref 3.5–5.1)
Sodium: 135 mmol/L (ref 135–145)

## 2018-01-22 LAB — MAGNESIUM: Magnesium: 1.8 mg/dL (ref 1.7–2.4)

## 2018-01-22 MED ORDER — POTASSIUM CHLORIDE 10 MEQ/100ML IV SOLN
10.0000 meq | INTRAVENOUS | Status: AC
  Administered 2018-01-22 (×5): 10 meq via INTRAVENOUS
  Filled 2018-01-22: qty 100

## 2018-01-22 NOTE — Progress Notes (Signed)
S: Slept well. Denies nausea with clamping yesterday and reports feeling hungry. The only PRN he received yesterday and overnight was dilaudid. Reports he emptied his bag 1-2 times yesterday although 0 ostomy output is recorded. Also reports drinking a couple breeze shakes and apple juice. Ambulating in halls. Abdominal pain continues to improve.   Vitals, labs, intake/output, and orders reviewed at this time. Afebrile, no tachycardia, hypertension improving. Sat 96% room air.  Hypokalemia 3.2. Cr 1.24. Mag 1.8. PO intake 250-->NG 550 UOP 1000 JP 20 Stool output not recorded.   Gen: A&Ox3, no distress  H&N: EOMI, atraumatic, neck supple Chest: unlabored respirations, RRR Abd: soft, non tender, nondistended, incision intact with fibrinous/serous drainage from inferior aspect, no surrounding erythema, induration or warmth. Ostomy pink and productive with liquid brown stool in bag. JP output serous.  Ext: warm, no edema Neuro: grossly normal  Lines/tubes/drains: PIV, JP  A/P:  Pod 9lap assisted sigmoidectomy with diverting loop ileostomy.  -Post op ileus (gastric)-improving. Reports appetite. NG in place, output light.Ostomyproductive, not distended today.Scheduled reglan.Chewing gum, hard candies.Clamp NG today. OK for sips of clears, continue strict I&O. Hypokalemia, hypomagnesemia- replace IV  -Acute kidney injury on chronic kidney disease-resolved. Creatinineback to baseline. Urine output ok.Continue IVF@75while  he is not eating.Avoid any nephrotoxic agents/ nsaids other than baby aspirin/ recheck tomorrow. Monitor I&Os strictly- risk of fluid overload with hx CHF and vascular disease. -Atelectasis- aggressive pulm toilet, encouraged pt to use IS 10x per hour and explained reasoning -Continuecurrent pain regimen.He is on all of his home medications for pain. IV dilaudid for breakthrough. Hard for him to swallow meds around the NG tube.  -Continue to mobilize/ out of bed as  much as possible  -PVD/CAD- continue ASA 81, imdur, coreg -Insulin dependent diabetes- continue sliding scale -HTN-Home meds restarted today(coreg, imdur, torsemide,lisinopril) -PVD/ CAD- continue Asa 81, hold plavix until discharge   DISPO- Clamp NG today. Replace potassium. Clear liquids as tolerated. Hopefully can remove NG tomorrow. Plan to remove JP drain and red ravenel catheter prior to discharge, which hopefully will be in next 1-2 days.       Romana Juniper, MD [facility], [place] Pager [phone]

## 2018-01-22 NOTE — Progress Notes (Signed)
Physical Therapy Treatment Patient Details Name: [name] MRN: [id number] DOB: [birth year] Today's Date: 01/22/2018    History of Present Illness sigmoidectomy with diverting loop ileostomy    PT Comments    Assisted OOB to amb a greater distance.  No c/o nausea.    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 Restrictions Weight Bearing Restrictions: No    Mobility  Bed Mobility Overal bed mobility: Needs Assistance Bed Mobility: Supine to Sit;Sit to Supine     Supine to sit: Modified independent (Device/Increase time) Sit to supine: Modified independent (Device/Increase time)   General bed mobility comments: able to self perform  Transfers Overall transfer level: Needs assistance Equipment used: Rolling walker (2 wheeled) Transfers: Sit to/from [facility] Sit to Stand: Supervision;Min guard Stand pivot transfers: Supervision;Min guard       General transfer comment: good safety cognition and use of hands to steady self  Ambulation/Gait Ambulation/Gait assistance: Min guard;Supervision Ambulation Distance (Feet): 650 Feet Assistive device: Rolling walker (2 wheeled) Gait Pattern/deviations: Step-through pattern;Decreased step length - right;Decreased step length - left;Trunk flexed Gait velocity: decreased but safe   General Gait Details: slow, steady pace; good safety awareness  tolerated amb around unit + 1/2   Stairs             Wheelchair Mobility    Modified Rankin (Stroke Patients Only)       Balance Overall balance assessment: Needs assistance Sitting-balance support: Bilateral upper  extremity supported Sitting balance-Leahy Scale: Good     Standing balance support: Bilateral upper extremity supported;During functional activity Standing balance-Leahy Scale: Fair                              Cognition Arousal/Alertness: Awake/alert Behavior During Therapy: WFL for tasks assessed/performed Overall Cognitive Status: Within Functional Limits for tasks assessed                                        Exercises      General Comments        Pertinent Vitals/Pain Pain Assessment: No/denies pain    Home Living                      Prior Function            PT Goals (current goals can now be found in the care plan section) Progress towards PT goals: Progressing toward goals    Frequency    Min 3X/week      PT Plan Current plan remains appropriate    Co-evaluation              AM-PAC PT "6 Clicks" Daily Activity  Outcome Measure  Difficulty turning over in bed (including adjusting bedclothes, sheets and blankets)?: A Little Difficulty moving from lying on back to sitting on the side of the bed? : A Little Difficulty sitting down on and standing up from a chair with arms (e.g., wheelchair, bedside commode, etc,.)?: A Little Help needed moving to and from a bed to chair (including  a wheelchair)?: A Little Help needed walking in hospital room?: A Little Help needed climbing 3-5 steps with a railing? : A Lot 6 Click Score: 17    End of Session Equipment Utilized During Treatment: Gait belt Activity Tolerance: Patient tolerated treatment well Patient left: in bed;with call bell/phone within reach Nurse Communication: Mobility status PT Visit Diagnosis: Unsteadiness on feet (R26.81);Other abnormalities of gait and mobility (R26.89);Difficulty in walking, not elsewhere classified (R26.2);Muscle weakness (generalized) (M62.81)     Time: 5947-0761 PT Time Calculation (min) (ACUTE ONLY): 24 min  Charges:   $Gait Training: 8-22 mins $Therapeutic Activity: 8-22 mins                    G Codes:       Rica Koyanagi  PTA [facility]  Acute  Rehab Pager      [phone]

## 2018-01-22 NOTE — Progress Notes (Signed)
Occupational Therapy Treatment Patient Details Name: [name] MRN: [id number] DOB: 1956-02-16 Today's Date: 01/22/2018    History of present illness sigmoidectomy with diverting loop ileostomy   OT comments  Pt left sitting EOB.  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 Restrictions Weight Bearing Restrictions: No       Mobility Bed Mobility Overal bed mobility: Needs Assistance Bed Mobility: Supine to Sit;Sit to Supine     Supine to sit: Supervision;Min guard Sit to supine: Supervision;Min guard   General bed mobility comments: able to self perform  Transfers Overall transfer level: Needs assistance Equipment used: Rolling walker (2 wheeled) Transfers: Sit to/from [facility] Sit to Stand: Supervision;Min guard Stand pivot transfers: Supervision;Min guard            Balance Overall balance assessment: Needs assistance Sitting-balance support: Bilateral upper extremity supported Sitting balance-Leahy Scale: Good     Standing balance support: Bilateral upper extremity supported;During functional activity Standing balance-Leahy Scale: Fair                             ADL either performed or assessed with clinical judgement   ADL   Eating/Feeding: Sitting;Set up   Grooming: Standing;Min guard               Lower Body Dressing: Moderate assistance;Sitting/lateral leans;Sit to/from stand;Cueing for sequencing;Cueing for safety   Toilet Transfer: Minimal assistance;Ambulation Toilet Transfer Details (indicate cue type and reason): sit to stand Toileting- Clothing  Manipulation and Hygiene: Minimal assistance;Sit to/from stand;Cueing for sequencing                         Cognition Arousal/Alertness: Awake/alert Behavior During Therapy: WFL for tasks assessed/performed Overall Cognitive Status: Within Functional Limits for tasks assessed                                                   Frequency  Min 2X/week        Progress Toward Goals  OT Goals(current goals can now be found in the care plan section)  Progress towards OT goals: Progressing toward goals     Plan Discharge plan remains appropriate    Co-evaluation                 AM-PAC PT "6 Clicks" Daily Activity     Outcome Measure   Help from another person eating meals?: None Help from another person taking care of personal grooming?: A Little Help from another person toileting, which includes using toliet, bedpan, or urinal?: A Little Help from another person bathing (including washing, rinsing, drying)?: A Lot Help from another person to put on and taking off regular upper body clothing?: None Help from another person to put on and taking off regular lower body clothing?: A Lot 6 Click Score: 18    End of Session    OT Visit Diagnosis: Unsteadiness on feet (R26.81);Other abnormalities of gait and  mobility (R26.89);Muscle weakness (generalized) (M62.81)   Activity Tolerance Patient limited by pain(RN gave meds during OT session)   Patient Left in bed;Other (comment);with bed alarm set(sitting EOB)   Nurse Communication Mobility status        Time: 6808-8110 OT Time Calculation (min): 10 min  Charges: OT Treatments $Self Care/Home Management : 8-22 mins  [place], [place]   Betsy Pries 01/22/2018, 12:49 PM

## 2018-01-22 NOTE — Progress Notes (Signed)
Pt did good today.  Experienced no nausea while ng was clamped.  Tolerated liquid diet and ambulated with PT several times.  Output from ostomy is good.  Only complaint pt had today was that he felt the ng tube was making his voice change.  MD was notified, no orders given but continued to monitor it.  By this evening he said he thought it was better.

## 2018-01-23 LAB — BASIC METABOLIC PANEL
Anion gap: 7 (ref 5–15)
BUN: 7 mg/dL (ref 6–20)
CO2: 22 mmol/L (ref 22–32)
Calcium: 7.7 mg/dL — ABNORMAL LOW (ref 8.9–10.3)
Chloride: 104 mmol/L (ref 101–111)
Creatinine, Ser: 1.37 mg/dL — ABNORMAL HIGH (ref 0.61–1.24)
GFR calc Af Amer: >60 mL/min (ref >60)
GFR calc non Af Amer: 53 mL/min — ABNORMAL LOW (ref >60)
Glucose, Bld: 178 mg/dL — ABNORMAL HIGH (ref 65–99)
Potassium: 3.9 mmol/L (ref 3.5–5.1)
Sodium: 133 mmol/L — ABNORMAL LOW (ref 135–145)

## 2018-01-23 LAB — GLUCOSE, CAPILLARY
Glucose-Capillary: 169 mg/dL — ABNORMAL HIGH (ref 65–99)
Glucose-Capillary: 164 mg/dL — ABNORMAL HIGH (ref 65–99)
Glucose-Capillary: 153 mg/dL — ABNORMAL HIGH (ref 65–99)
Glucose-Capillary: 243 mg/dL — ABNORMAL HIGH (ref 65–99)

## 2018-01-23 LAB — MAGNESIUM: Magnesium: 1.5 mg/dL — ABNORMAL LOW (ref 1.7–2.4)

## 2018-01-23 MED ORDER — FAMOTIDINE 20 MG PO TABS
20.0000 mg | ORAL_TABLET | Freq: Two times a day (BID) | ORAL | Status: DC
  Administered 2018-01-23 (×2): 20 mg via ORAL
  Filled 2018-01-23 (×3): qty 1

## 2018-01-23 MED ORDER — MAGNESIUM SULFATE 4 GM/100ML IV SOLN
4.0000 g | Freq: Once | INTRAVENOUS | Status: AC
  Administered 2018-01-23: 4 g via INTRAVENOUS
  Filled 2018-01-23: qty 100

## 2018-01-23 NOTE — Consult Note (Signed)
[place] Nurse ostomy follow up Stoma type/location:  RUQ ileostomy.  Red rubber catheter is removed per MD order.   Stomal assessment/size: 1 3/4" round pink and moist Peristomal assessment:  Erythema circumferentially Treatment options for stomal/peristomal skin: barrier ring and convex pouch.  This has held true and we will continue with convexity.  Output liquid green stool Ostomy pouching: 1pc.convex  Education provided:  Patient performed pouch change indepently.  I ordered 3 pouch sets with barrier rings.  Enrolled patient in [place] Start Discharge program: Yes [place] team will follow  Domenic Moras RN BSN [facility] Pager [phone]

## 2018-01-23 NOTE — Progress Notes (Signed)
Initial Nutrition Assessment/  Nutrition Brief Note  Patient identified on for LOS   62 y.o. M admitted on 01/13/18 for diverticulitis of small intestine with perforation and abscess. PMH of carotid artery occlusion, T2DM, HTN, HLD, COPD, OSA, CHF, CAD, PAD, osteomyelitis, DJD, GERD, Barrett's esophagus, diverticulitis and diverticulosis of colon, fatty liver, neuromuscular disorder, and full dentures. 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   Wt Readings from Last 15 Encounters:  01/13/18 246 lb 6 oz (111.8 kg)  01/07/18 246 lb 6 oz (111.8 kg)  01/03/18 250 lb (113.4 kg)  11/10/17 262 lb 5.6 oz (119 kg)  10/22/17 250 lb (113.4 kg)  03/01/17 250 lb (113.4 kg)  02/05/17 250 lb (113.4 kg)  12/11/16 250 lb (113.4 kg)  11/12/16 250 lb (113.4 kg)  11/05/16 250 lb (113.4 kg)  10/11/16 250 lb (113.4 kg)  09/19/16 250 lb (113.4 kg)  09/03/16 250 lb (113.4 kg)  07/30/16 252 lb 4.8 oz (114.4 kg)  07/25/16 250 lb (113.4 kg)    Body mass index is 31.63 kg/m. Patient meets criteria for Obese based on current BMI.   Current diet order is CHO modified, patient is consuming approximately 100% of meals at this time. Labs and medications reviewed.   No nutrition interventions warranted at this time. If nutrition issues arise, please consult RD.   Hope Budds, Dietetic Intern

## 2018-01-23 NOTE — Progress Notes (Signed)
Physical Therapy Treatment Patient Details Name: [name] MRN: [id number] DOB: Oct 12, 1955 Today's Date: 01/23/2018    History of Present Illness sigmoidectomy with diverting loop ileostomy    PT Comments    Pt progressing well with his mobility.    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 Restrictions Weight Bearing Restrictions: No    Mobility  Bed Mobility Overal bed mobility: Modified Independent             General bed mobility comments: able to self perform  Transfers Overall transfer level: Needs assistance Equipment used: Rolling walker (2 wheeled) Transfers: Sit to/from [facility] Sit to Stand: Supervision Stand pivot transfers: Supervision       General transfer comment: good safety cognition and use of hands to steady self  Ambulation/Gait Ambulation/Gait assistance: Supervision Ambulation Distance (Feet): 525 Feet Assistive device: Rolling walker (2 wheeled) Gait Pattern/deviations: Step-through pattern;Decreased step length - right;Decreased step length - left;Trunk flexed Gait velocity: decreased but safe   General Gait Details: slow, steady pace; good safety awareness  tolerated amb around unit + 1/2   Stairs             Wheelchair Mobility    Modified Rankin (Stroke Patients Only)       Balance                                            Cognition                                              Exercises      General Comments        Pertinent Vitals/Pain Pain Assessment: No/denies pain    Home Living                       Prior Function            PT Goals (current goals can now be found in the care plan section) Progress towards PT goals: Progressing toward goals    Frequency    Min 3X/week      PT Plan Current plan remains appropriate    Co-evaluation              AM-PAC PT "6 Clicks" Daily Activity  Outcome Measure  Difficulty turning over in bed (including adjusting bedclothes, sheets and blankets)?: A Little Difficulty moving from lying on back to sitting on the side of the bed? : A Little Difficulty sitting down on and standing up from a chair with arms (e.g., wheelchair, bedside commode, etc,.)?: A Little Help needed moving to and from a bed to chair (including a wheelchair)?: A Little Help needed walking in hospital room?: A Little Help needed climbing 3-5 steps with a railing? : A Little 6 Click Score: 18    End of Session Equipment Utilized During Treatment: Gait belt Activity Tolerance: Patient tolerated treatment well Patient left: in bed;with call bell/phone within reach  Nurse Communication: Mobility status PT Visit Diagnosis: Unsteadiness on feet (R26.81);Other abnormalities of gait and mobility (R26.89);Difficulty in walking, not elsewhere classified (R26.2);Muscle weakness (generalized) (M62.81)     Time: 4932-4199 PT Time Calculation (min) (ACUTE ONLY): 26 min  Charges:  $Gait Training: 8-22 mins $Therapeutic Activity: 8-22 mins                    G Codes:       {Eleny Cortez  PTA [facility]  Acute  Rehab Pager      [phone]

## 2018-01-23 NOTE — Progress Notes (Addendum)
S: Slept well. No nausea, abdominal pain or bloating with NG tube clamped, tolerating fair amount of clears. Ostomy productive gas and stool. Only Prn received last 24h is again IV dilaudid. Still reports feeling hungry.   Vitals, labs, intake/output, and orders reviewed at this time. Afebrile, no tachycardia, normo- to hypertensive. Sats 99% room air.  Na 133, K 3.9, bicarb 22, Cr 1.37, mag 1.5 PO 400 UOP 1875 Stool 945, has emptied about 200 already this AM.  Drain 130, serous  Gen: A&Ox3, no distress sitting at edge of bed  H&N: EOMI, atraumatic, neck supple Chest: unlabored respirations, RRR Abd: soft, nontender, nondistended, incision intact with serous drainage from inferior aspect. No surrounding induration, warmth or tenderness but faint erythema at upper mid right side of incision new from yesterday.  Dressing from last night is saturated- changed now. Ostomy pink and productive, liquid green stool and gas. JP output serous.  Ext: warm, no edema Neuro: grossly normal  Lines/tubes/drains: PIV JP  A/P:  Pod10 lap assisted sigmoidectomy with diverting loop ileostomy.  -Post op ileus -improving. Reports appetite, no nausea or bloating with NG clamped for 24h- NG removed.Ostomyproductive,notdistended today.Continue reglan, advance diet hypomagnesemia- replace IV -Acute kidney injury on chronic kidney disease-resolved. Creatinineback to baseline. Urine output ok.stop IVF.Avoid any nephrotoxic agents/ nsaids other than baby aspirin. Monitor I&Os strictly- risk of fluid overload with hx CHF and vascular disease. -Continuecurrent pain regimen.Transition to PO pain meds- all are ordered but he has only been receiving dilaudid due to difficultyu swallowing around ng which is now out.   -Continue to mobilize/ out of bed as much as possible  -PVD/CAD/HTN- continue ASA 81, imdur, coreg, torsemide, lisinopril. Resume plavix at discharge -Insulin dependent diabetes- continue  sliding scale    DISPO- NG out, advance diet, WOC to remove red rubber catheter from ostomy. Will pull JP drain at time of discharge. Continue to monitor midline incision. Likely discharge tomorrow.       Romana Juniper, MD [facility] Surgery, [place] Pager [phone]

## 2018-01-24 LAB — GLUCOSE, CAPILLARY
Glucose-Capillary: 155 mg/dL — ABNORMAL HIGH (ref 65–99)
Glucose-Capillary: 149 mg/dL — ABNORMAL HIGH (ref 65–99)
Glucose-Capillary: 195 mg/dL — ABNORMAL HIGH (ref 65–99)

## 2018-01-24 MED ORDER — INSULIN ASPART 100 UNIT/ML ~~LOC~~ SOLN
0.0000 [IU] | Freq: Three times a day (TID) | SUBCUTANEOUS | Status: DC
  Administered 2018-01-24: 3 [IU] via SUBCUTANEOUS
  Administered 2018-01-24: 2 [IU] via SUBCUTANEOUS
  Administered 2018-01-25: 5 [IU] via SUBCUTANEOUS
  Administered 2018-01-25: 2 [IU] via SUBCUTANEOUS

## 2018-01-24 MED ORDER — METOCLOPRAMIDE HCL 10 MG PO TABS: 10.0000 mg | ORAL_TABLET | Freq: Three times a day (TID) | ORAL | 1 refills | Status: DC

## 2018-01-24 MED ORDER — INSULIN ASPART 100 UNIT/ML ~~LOC~~ SOLN: 0.0000 [IU] | Freq: Every day | SUBCUTANEOUS | Status: DC

## 2018-01-24 NOTE — Progress Notes (Signed)
S: Slept well, tolerating diet, no nausea or bloating. Ostomy productive. Pain well controlled.   Vitals, labs, intake/output, and orders reviewed at this time. Afebrile, normotensive, no tachycardia, sat 98% room air PO intake 240, he reports a good amount of solid intaje UOP 1975 JP 20 Ostomy 700   Gen: A&Ox3, no distress  H&N: EOMI, atraumatic, neck supple Chest: unlabored respirations, RRR Abd: soft, non ender, nondistended, incision intact with serous drainage from mid-lower part, no significant surrounding erythema, probed with q-tip no fluid pocket or purulent drainage. Ostomy pink and productive of thick liquid. JP output serous Ext: warm, no edema Neuro: grossly normal  Lines/tubes/drains: PIV JP  A/P:  Pod11 lap assisted sigmoidectomy with diverting loop ileostomy.  -Post op ileus -resolved.  -Acute kidney injury on chronic kidney disease-resolved.Creatinineback to baseline.Urine output ok.Avoid any nephrotoxic agents/ nsaids other than baby aspirin. Monitor I&Os strictly- risk of fluid overload with hx CHF and vascular disease. -Continuecurrent pain regimen. He will not need Rx at home as he is on a contract with his PCP Dr. Vanetta Shawl -Continue to mobilize/ out of bed as much as possible  -PVD/CAD/HTN- continue ASA 81, imdur, coreg, torsemide, lisinopril. Resume plavix at discharge -Insulin dependent diabetes- continue sliding scale    DISPO- Pull JP drain today. Discharge tomorrow.      Romana Juniper, MD [facility] Surgery, [place] Pager [phone]

## 2018-01-24 NOTE — Progress Notes (Signed)
Physical Therapy Treatment Patient Details Name: [name] MRN: [id number] DOB: 12-30-1955 Today's Date: 01/24/2018    History of Present Illness sigmoidectomy with diverting loop ileostomy    PT Comments    Assisted with amb a greater distance in hallway.  Pt greatly progressing with his mobility and GI Dx.  Pt hopes to D/C to home with spouse Saturday.    Follow Up Recommendations  Home health PT;Supervision for mobility/OOB     Equipment Recommendations  None recommended by PT    Recommendations for Other Services       Precautions / Restrictions Precautions Precautions: Fall Precaution Comments: colostomy/drain Other Brace/Splint: R BKA Prosthesis and L AFO with show Restrictions Weight Bearing Restrictions: No    Mobility  Bed Mobility               General bed mobility comments: EOB on arrival  Transfers Overall transfer level: Needs assistance Equipment used: Rolling walker (2 wheeled) Transfers: Sit to/from [facility] Sit to Stand: Supervision Stand pivot transfers: Supervision       General transfer comment: good safety cognition and use of hands to steady self  Ambulation/Gait Ambulation/Gait assistance: Supervision;Min guard Ambulation Distance (Feet): 600 Feet Assistive device: Rolling walker (2 wheeled) Gait Pattern/deviations: Step-through pattern;Decreased step length - right;Decreased step length - left;Trunk flexed Gait velocity: decreased but safe   General Gait Details: slow, steady pace; good safety awareness  tolerated amb around unit + 1/2   Stairs             Wheelchair Mobility    Modified Rankin (Stroke Patients Only)       Balance                                            Cognition Arousal/Alertness: Awake/alert Behavior During Therapy: WFL for tasks assessed/performed Overall Cognitive Status: Within Functional Limits for tasks assessed                                        Exercises      General Comments        Pertinent Vitals/Pain Pain Assessment: Faces Faces Pain Scale: Hurts a little bit Pain Location: abdomen area Pain Descriptors / Indicators: Grimacing Pain Intervention(s): Premedicated before session    Home Living                      Prior Function            PT Goals (current goals can now be found in the care plan section) Progress towards PT goals: Progressing toward goals    Frequency    Min 3X/week      PT Plan Current plan remains appropriate    Co-evaluation              AM-PAC PT "6 Clicks" Daily Activity  Outcome Measure  Difficulty turning over in bed (including adjusting bedclothes, sheets and blankets)?: None Difficulty moving from lying on back to sitting on the side of the bed? : A Little Difficulty sitting down on and standing up from a chair with arms (e.g., wheelchair, bedside commode, etc,.)?: A Little Help needed moving to and from a bed to chair (including a wheelchair)?: A Little Help needed walking in hospital room?:  A Little Help needed climbing 3-5 steps with a railing? : A Little 6 Click Score: 19    End of Session Equipment Utilized During Treatment: Gait belt Activity Tolerance: Patient tolerated treatment well Patient left: in bed;with call bell/phone within reach Nurse Communication: Mobility status PT Visit Diagnosis: Unsteadiness on feet (R26.81);Other abnormalities of gait and mobility (R26.89);Difficulty in walking, not elsewhere classified (R26.2);Muscle weakness (generalized) (M62.81)     Time: 7530-1040 PT Time Calculation (min) (ACUTE ONLY): 17 min  Charges:  $Gait Training: 8-22 mins                    G Codes:       Rica Koyanagi  PTA [facility]  Acute  Rehab Pager      [phone]

## 2018-01-24 NOTE — Discharge Instructions (Signed)
SURGERY: POST OP INSTRUCTIONS °(Surgery for small bowel obstruction, colon resection, etc) ° ° °###################################################################### ° °EAT °Gradually transition to a high fiber diet with a fiber supplement over the next few days after discharge ° °WALK °Walk an hour a day.  Control your pain to do that.   ° °CONTROL PAIN °Control pain so that you can walk, sleep, tolerate sneezing/coughing, go up/down stairs. ° °HAVE A BOWEL MOVEMENT DAILY °Keep your bowels regular to avoid problems.  OK to try a laxative to override constipation.  OK to use an antidairrheal to slow down diarrhea.  Call if not better after 2 tries ° °CALL IF YOU HAVE PROBLEMS/CONCERNS °Call if you are still struggling despite following these instructions. °Call if you have concerns not answered by these instructions ° °###################################################################### ° ° °DIET °Follow a light diet the first few days at home.  Start with a bland diet such as soups, liquids, starchy foods, low fat foods, etc.  If you feel full, bloated, or constipated, stay on a ful liquid or pureed/blenderized diet for a few days until you feel better and no longer constipated. °Be sure to drink plenty of fluids every day to avoid getting dehydrated (feeling dizzy, not urinating, etc.). °Gradually add a fiber supplement to your diet over the next week.  Gradually get back to a regular solid diet.  Avoid fast food or heavy meals the first week as you are more likely to get nauseated. °It is expected for your digestive tract to need a few months to get back to normal.  It is common for your bowel movements and stools to be irregular.  You will have occasional bloating and cramping that should eventually fade away.  Until you are eating solid food normally, off all pain medications, and back to regular activities; your bowels will not be normal. °Focus on eating a low-fat, high fiber diet the rest of your life  (See Getting to Good Bowel Health, below). ° °CARE of your INCISION or WOUND °It is good for closed incision and even open wounds to be washed every day.  Shower every day.  Short baths are fine.  Wash the incisions and wounds clean with soap & water.    °If you have a closed incision(s), wash the incision with soap & water every day.  You may leave closed incisions open to air if it is dry.   You may cover the incision with clean gauze & replace it after your daily shower for comfort. °If you have skin tapes (Steristrips) or skin glue (Dermabond) on your incision, leave them in place.  They will fall off on their own like a scab.  You may trim any edges that curl up with clean scissors.  If you have staples, set up an appointment for them to be removed in the office in 10 days after surgery.  °If you have a drain, wash around the skin exit site with soap & water and place a new dressing of gauze or band aid around the skin every day.  Keep the drain site clean & dry.    °If you have an open wound with packing, see wound care instructions.  In general, it is encouraged that you remove your dressing and packing, shower with soap & water, and replace your dressing once a day.  Pack the wound with clean gauze moistened with normal (0.9%) saline to keep the wound moist & uninfected.  Pressure on the dressing for 30 minutes will stop most wound   bleeding.  Eventually your body will heal & pull the open wound closed over the next few months.  °Raw open wounds will occasionally bleed or secrete yellow drainage until it heals closed.  Drain sites will drain a little until the drain is removed.  Even closed incisions can have mild bleeding or drainage the first few days until the skin edges scab over & seal.   °If you have an open wound with a wound vac, see wound vac care instructions. ° ° ° ° °ACTIVITIES as tolerated °Start light daily activities --- self-care, walking, climbing stairs-- beginning the day after surgery.   Gradually increase activities as tolerated.  Control your pain to be active.  Stop when you are tired.  Ideally, walk several times a day, eventually an hour a day.   °Most people are back to most day-to-day activities in a few weeks.  It takes 4-8 weeks to get back to unrestricted, intense activity. °If you can walk 30 minutes without difficulty, it is safe to try more intense activity such as jogging, treadmill, bicycling, low-impact aerobics, swimming, etc. °Save the most intensive and strenuous activity for last (Usually 4-8 weeks after surgery) such as sit-ups, heavy lifting, contact sports, etc.  Refrain from any intense heavy lifting or straining until you are off narcotics for pain control.  You will have off days, but things should improve week-by-week. °DO NOT PUSH THROUGH PAIN.  Let pain be your guide: If it hurts to do something, don't do it.  Pain is your body warning you to avoid that activity for another week until the pain goes down. °You may drive when you are no longer taking narcotic prescription pain medication, you can comfortably wear a seatbelt, and you can safely make sudden turns/stops to protect yourself without hesitating due to pain. °You may have sexual intercourse when it is comfortable. If it hurts to do something, stop. ° °MEDICATIONS °Take your usually prescribed home medications unless otherwise directed.   °Blood thinners:  °Usually you can restart any strong blood thinners after the second postoperative day.  It is OK to take aspirin right away.    ° If you are on strong blood thinners (warfarin/Coumadin, Plavix, Xerelto, Eliquis, Pradaxa, etc), discuss with your surgeon, medicine PCP, and/or cardiologist for instructions on when to restart the blood thinner & if blood monitoring is needed (PT/INR blood check, etc).   ° ° °PAIN CONTROL °Pain after surgery or related to activity is often due to strain/injury to muscle, tendon, nerves and/or incisions.  This pain is usually  short-term and will improve in a few months.  °To help speed the process of healing and to get back to regular activity more quickly, DO THE FOLLOWING THINGS TOGETHER: °1. Increase activity gradually.  DO NOT PUSH THROUGH PAIN °2. Use Ice and/or Heat °3. Try Gentle Massage and/or Stretching °4. Take over the counter pain medication °5. Take Narcotic prescription pain medication for more severe pain ° °Good pain control = faster recovery.  It is better to take more medicine to be more active than to stay in bed all day to avoid medications. °1.  Increase activity gradually °Avoid heavy lifting at first, then increase to lifting as tolerated over the next 6 weeks. °Do not “push through” the pain.  Listen to your body and avoid positions and maneuvers than reproduce the pain.  Wait a few days before trying something more intense °Walking an hour a day is encouraged to help your body recover faster   and more safely.  Start slowly and stop when getting sore.  If you can walk 30 minutes without stopping or pain, you can try more intense activity (running, jogging, aerobics, cycling, swimming, treadmill, sex, sports, weightlifting, etc.) Remember: If it hurts to do it, then dont do it! 2. Use Ice and/or Heat You will have swelling and bruising around the incisions.  This will take several weeks to resolve. Ice packs or heating pads (6-8 times a day, 30-60 minutes at a time) will help sooth soreness & bruising. Some people prefer to use ice alone, heat alone, or alternate between ice & heat.  Experiment and see what works best for you.  Consider trying ice for the first few days to help decrease swelling and bruising; then, switch to heat to help relax sore spots and speed recovery. Shower every day.  Short baths are fine.  It feels good!  Keep the incisions and wounds clean with soap & water.   3. Try Gentle Massage and/or Stretching Massage at the area of pain many times a day Stop if you feel pain - do not  overdo it 4. Take over the counter pain medication This helps the muscle and nerve tissues become less irritable and calm down faster Choose ONE of the following over-the-counter anti-inflammatory medications: Acetaminophen 500mg  tabs (Tylenol) 1-2 pills with every meal and just before bedtime (avoid if you have liver problems or if you have acetaminophen in you narcotic prescription) Do not take NSAIDS such as ibuprofen or aleve due to your kidney disease Take with food/snack several times a day as directed for at least 2 weeks to help keep pain / soreness down & more manageable. 5. Take Narcotic prescription pain medication for more severe pain A prescription for strong pain control is often given to you upon discharge (for example: oxycodone/Percocet, hydrocodone/Norco/Vicodin, or tramadol/Ultram) Take your pain medication as prescribed. Be mindful that most narcotic prescriptions contain Tylenol (acetaminophen) as well - avoid taking too much Tylenol. If you are having problems/concerns with the prescription medicine (does not control pain, nausea, vomiting, rash, itching, etc.), please call us [phone] to see if we need to switch you to a different pain medicine that will work better for you and/or control your side effects better. If you need a refill on your pain medication, you must call the office before 4 pm and on weekdays only.  By federal law, prescriptions for narcotics cannot be called into a pharmacy.  They must be filled out on paper & picked up from our office by the patient or authorized caretaker.  Prescriptions cannot be filled after 4 pm nor on weekends.    WHEN TO CALL us [phone] Severe uncontrolled or worsening pain  Fever over 101 F (38.5 C) Concerns with the incision: Worsening pain, redness, rash/hives, swelling, bleeding, or drainage Reactions / problems with new medications (itching, rash, hives, nausea, etc.) Nausea and/or vomiting Difficulty  urinating Difficulty breathing Worsening fatigue, dizziness, lightheadedness, blurred vision Other concerns If you are not getting better after two weeks or are noticing you are getting worse, contact our office [phone] for further advice.  We may need to adjust your medications, re-evaluate you in the office, send you to the emergency room, or see what other things we can do to help. The clinic staff is available to answer your questions during regular business hours (8:30am-5pm).  Please dont hesitate to call and ask to speak to one of our nurses for clinical concerns.  A surgeon from [facility] is always on call at the hospitals 24 hours/day If you have a medical emergency, go to the nearest emergency room or call 911.  FOLLOW UP in our office One the day of your discharge from the hospital (or the next business weekday), please call [facility] to set up or confirm an appointment to see your surgeon in the office for a follow-up appointment.  Usually it is 2-3 weeks after your surgery.   If you have skin staples at your incision(s), let the office know so we can set up a time in the office for the nurse to remove them (usually around 10 days after surgery). Make sure that you call for appointments the day of discharge (or the next business weekday) from the hospital to ensure a convenient appointment time. IF YOU HAVE DISABILITY OR FAMILY LEAVE FORMS, BRING THEM TO THE OFFICE FOR PROCESSING.  DO NOT GIVE THEM TO YOUR DOCTOR.  [facility] [address], [place], [place], [place]  [address] ? [phone] - Main [phone] - [place],  [phone] - Fax [email or web address]  GETTING TO GOOD BOWEL HEALTH. It is expected for your digestive tract to need a few months to get back to normal.  It is common for your bowel movements and stools to be irregular.  You will have occasional bloating and cramping that should  eventually fade away.  Until you are eating solid food normally, off all pain medications, and back to regular activities; your bowels will not be normal.   Avoiding constipation The goal: ONE SOFT BOWEL MOVEMENT A DAY!    Drink plenty of fluids.  Choose water first. TAKE A FIBER SUPPLEMENT EVERY DAY THE REST OF YOUR LIFE During your first week back home, gradually add back a fiber supplement every day Experiment which form you can tolerate.   There are many forms such as powders, tablets, wafers, gummies, etc Psyllium bran (Metamucil), methylcellulose (Citrucel), Miralax or Glycolax, Benefiber, Flax Seed.  Adjust the dose week-by-week (1/2 dose/day to 6 doses a day) until you are moving your bowels 1-2 times a day.  Cut back the dose or try a different fiber product if it is giving you problems such as diarrhea or bloating. Sometimes a laxative is needed to help jump-start bowels if constipated until the fiber supplement can help regulate your bowels.  If you are tolerating eating & you are farting, it is okay to try a gentle laxative such as double dose MiraLax, prune juice, or Milk of Magnesia.  Avoid using laxatives too often. Stool softeners can sometimes help counteract the constipating effects of narcotic pain medicines.  It can also cause diarrhea, so avoid using for too long. If you are still constipated despite taking fiber daily, eating solids, and a few doses of laxatives, call our office. Controlling diarrhea Try drinking liquids and eating bland foods for a few days to avoid stressing your intestines further. Avoid dairy products (especially milk & ice cream) for a short time.  The intestines often can lose the ability to digest lactose when stressed. Avoid foods that cause gassiness or bloating.  Typical foods include beans and other legumes, cabbage, broccoli, and dairy foods.  Avoid greasy, spicy, fast foods.  Every person has some sensitivity to other foods, so listen to your body  and avoid those foods that trigger problems for you. Probiotics (such as active yogurt, Align, etc) may help repopulate the intestines and colon with  normal bacteria and calm down a sensitive digestive tract Adding a fiber supplement gradually can help thicken stools by absorbing excess fluid and retrain the intestines to act more normally.  Slowly increase the dose over a few weeks.  Too much fiber too soon can backfire and cause cramping & bloating. It is okay to try and slow down diarrhea with a few doses of antidiarrheal medicines.   Bismuth subsalicylate (ex. Kayopectate, Pepto Bismol) for a few doses can help control diarrhea.  Avoid if pregnant.   Loperamide (Imodium) can slow down diarrhea.  Start with one tablet (2mg ) first.  Avoid if you are having fevers or severe pain.  ILEOSTOMY PATIENTS WILL HAVE CHRONIC DIARRHEA since their colon is not in use.    Drink plenty of liquids.  You will need to drink even more glasses of water/liquid a day to avoid getting dehydrated. Record output from your ileostomy.  Expect to empty the bag every 3-4 hours at first.  Most people with a permanent ileostomy empty their bag 4-6 times at the least.   Use antidiarrheal medicine (especially Imodium) several times a day to avoid getting dehydrated.  Start with a dose at bedtime & breakfast.  Adjust up or down as needed.  Increase antidiarrheal medications as directed to avoid emptying the bag more than 8 times a day (every 3 hours). Work with your wound ostomy nurse to learn care for your ostomy.  See ostomy care instructions. TROUBLESHOOTING IRREGULAR BOWELS 1) Start with a soft & bland diet. No spicy, greasy, or fried foods.  2) Avoid gluten/wheat or dairy products from diet to see if symptoms improve. 3) Miralax 17gm or flax seed mixed in [place]. water or juice-daily. May use 2-4 times a day as needed. 4) Gas-X, Phazyme, etc. as needed for gas & bloating.  5) Prilosec (omeprazole) over-the-counter as needed 6)   Consider probiotics (Align, Activa, etc) to help calm the bowels down  Call your doctor if you are getting worse or not getting better.  Sometimes further testing (cultures, endoscopy, X-ray studies, CT scans, bloodwork, etc.) may be needed to help diagnose and treat the cause of the diarrhea. [facility], [place], [place], [place], [place]  [address] [phone] - Main.    [phone]  - Toll Free.   [phone] - Fax [email or web address]  .

## 2018-01-24 NOTE — Discharge Summary (Signed)
Physician Discharge Summary  Patient ID: [name] MRN: [id number] DOB/AGE: 64-Feb-1957 64 y.o.  Admit date: 01/13/2018 Discharge date: 01/24/2018  Admission Diagnoses: Diverticulitis with perforation and abscess, colocutaneous fistula  Discharge Diagnoses:  Diverticulitis with perforation and abscess, colocutaneous fistula  Discharged Condition: good  Hospital Course: Admitted for routing post-operative care following laparoscopic assisted sigmoid colectomy, primary anastomosis with diverting loop ileostomy. His post-operative course was prolonged as he developed acute kidney injury on CKD which resolved with gentle fluid resuscitation, as well as post-op ileus requiring NG and bowel rest which ultimately resolved. On Post op day 10 he was able to tolerate a liquid diet which was advanced without issue. He did begin to have some serous drainage from his incision but signs of wound infection. His WBC remained normal, he was afebrile, and his drain output remained serous and this was removed on day 11. He was stable for discharge home on post-op day 11.   Consults: None  Significant Diagnostic Studies: labs and xrays see Epic history  Treatments: IV hydration, analgesia, respiratory therapy, therapies: PT and OT and surgery: laparoscopic assisted sigmoid colectomy with colorectal anastomosis and diverting loop ileostomy  Discharge Exam: Blood pressure (!) 132/55, pulse 62, temperature 98.1 F (36.7 C), temperature source Oral, resp. rate 18, height 6\' 2"  (1.88 m), weight 111.8 kg (246 lb 6 oz), SpO2 98 %. See rounding physician's note.   Disposition:  Home with home health  Allergies as of 01/24/2018      Reactions   Ivp Dye [iodinated Diagnostic Agents] Anaphylaxis, Other (See Comments)   Breathing problems   Adhesive [tape] Rash   Rash after 2-3 days use      Medication List    STOP taking these medications   Insulin Glulisine 100 UNIT/ML Solostar Pen Commonly known as:   APIDRA   linaclotide 145 MCG Caps capsule Commonly known as:  LINZESS     TAKE these medications   aspirin EC 81 MG tablet Take 81 mg by mouth daily.   atorvastatin 40 MG tablet Commonly known as:  LIPITOR Take 40 mg by mouth daily.   BASAGLAR KWIKPEN 100 UNIT/ML Sopn Inject 60 Units into the skin at bedtime. What changed:  Another medication with the same name was removed. Continue taking this medication, and follow the directions you see here. Notes to patient:  You may need to decrease this dose while you are not eating as much. Make sure you are checking your blood sugars.  Follow up with your primary care doctor.    carvedilol 25 MG tablet Commonly known as:  COREG Take 1.5 tablets (37.5 mg total) by mouth 2 (two) times daily.   clobetasol 0.05 % external solution Commonly known as:  TEMOVATE Apply 1 application topically daily as needed (dry scalp).   clopidogrel 75 MG tablet Commonly known as:  PLAVIX Take 75 mg by mouth daily.   clotrimazole-betamethasone cream Commonly known as:  LOTRISONE Apply 1 application topically daily as needed (dry skin - beard).   collagenase ointment Commonly known as:  SANTYL Apply topically daily. What changed:    how much to take  additional instructions   fentaNYL 50 MCG/HR Commonly known as:  DURAGESIC - dosed mcg/hr Place 1 patch (50 mcg total) onto the skin every 3 (three) days.   fluticasone 0.05 % cream Commonly known as:  CUTIVATE Apply 1 application topically 2 (two) times daily as needed (dry skin on face).   HUMALOG KWIKPEN 100 UNIT/ML KiwkPen Generic drug:  insulin lispro Inject 15 Units into the skin 3 (three) times daily with meals. 15 units with breakfast, lunch, & dinner-- pt may increase dose by 1 unit if sugar is elevated   HYDROmorphone 2 MG tablet Commonly known as:  DILAUDID Take 1 tablet (2 mg total) by mouth at bedtime. What changed:    when to take this  reasons to take this   isosorbide  mononitrate 120 MG 24 hr tablet Commonly known as:  IMDUR Take 120 mg by mouth daily.   lisinopril 20 MG tablet Commonly known as:  PRINIVIL,ZESTRIL Take 20 mg by mouth daily.   metFORMIN 1000 MG tablet Commonly known as:  GLUCOPHAGE Take 1,000 mg by mouth 2 (two) times daily. Notes to patient:  You may need to decrease this dose while you are not eating as much. Make sure you are checking your blood sugars.  Follow up with your primary care doctor.    metoCLOPramide 10 MG tablet Commonly known as:  REGLAN Take 1 tablet (10 mg total) by mouth 3 (three) times daily with meals. Notes to patient:  You can wean yourself off of this medication over the next few days, ok to take as needed for nausea   multivitamin with minerals Tabs tablet Take 1 tablet by mouth daily.   NITRO-BID 2 % ointment Generic drug:  nitroGLYCERIN Apply 0.75 inches topically daily as needed for chest pain. What changed:  Another medication with the same name was changed. Make sure you understand how and when to take each.   nitroGLYCERIN 0.4 MG SL tablet Commonly known as:  NITROSTAT Place 1 tablet (0.4 mg total) under the tongue every 5 (five) minutes as needed (as needed for esophageal spasm). What changed:  reasons to take this   nitroGLYCERIN 0.2 mg/hr patch Commonly known as:  NITRODUR - Dosed in mg/24 hr Place 1 patch (0.2 mg total) onto the skin daily. What changed:    when to take this  reasons to take this   omega-3 acid ethyl esters 1 g capsule Commonly known as:  LOVAZA Take 2 g by mouth 2 (two) times daily.   ondansetron 4 MG disintegrating tablet Commonly known as:  ZOFRAN-ODT Take 4 mg by mouth 4 (four) times daily as needed for nausea.   oxyCODONE-acetaminophen 5-325 MG tablet Commonly known as:  PERCOCET/ROXICET Take 1 tablet by mouth every 8 (eight) hours as needed. What changed:    when to take this  reasons to take this   pantoprazole 40 MG tablet Commonly known as:   PROTONIX Take 1 tablet (40 mg total) by mouth daily before breakfast.   pregabalin 100 MG capsule Commonly known as:  LYRICA Take 1 capsule (100 mg total) by mouth 3 (three) times daily.   Probiotic Caps Take 2 capsules by mouth daily after breakfast.   rOPINIRole 2 MG tablet Commonly known as:  REQUIP Take 2 mg by mouth at bedtime.   SYSTANE BALANCE OP Place 2-3 drops into both eyes daily as needed (for dry eyes).   tamsulosin 0.4 MG Caps capsule Commonly known as:  FLOMAX Take 0.4 mg by mouth daily.   torsemide 20 MG tablet Commonly known as:  DEMADEX Take 20 mg by mouth every morning. MAY TAKE AN EXTRA 20 MG ONCE A DAY FOR EXCESSIVE FLUID RETENTION      Follow-up Information    Clovis Riley, MD Follow up in 1 week(s).   Specialty:  General Surgery Contact information: [address] [place] [address] [phone]        Isaias Sakai, DO Follow up in 4 day(s).   Specialty:  Family Medicine Contact information: [place] [address] [phone]           Signed: Clovis Riley 01/24/2018, 8:13 AM

## 2018-01-25 LAB — GLUCOSE, CAPILLARY
Glucose-Capillary: 144 mg/dL — ABNORMAL HIGH (ref 65–99)
Glucose-Capillary: 232 mg/dL — ABNORMAL HIGH (ref 65–99)

## 2018-01-25 NOTE — Care Management Note (Addendum)
Case Management Note  Patient Details  Name: [name] MRN: [id number] Date of Birth: September 07, 1956  Subjective/Objective:   Lap assisted sigmoidectomy with diverting loop ileostomy                 Action/Plan:see previous NCM notes.  Pt is active with [facility] for [facility]. Pt s/p surgery with wound care and ne ileostomy. Will need HH RN/PT orders with F2F. NCM contacted attending covering MD, Dr Ninfa Linden for [facility] orders with F2F. Waiting call back.   9 NCM spoke to Dr. Brantley Stage, [facility] orders placed. Requested RW with seat. Made MD aware. [facility] contacted for RW with seat for home.   Expected Discharge Date:  01/25/18               Expected Discharge Plan:  [place]  In-House Referral:  NA  Discharge planning Services  CM Consult  Post Acute Care Choice:  Home Health Choice offered to:  Patient  DME Arranged:  N/A DME Agency:  NA  HH Arranged:  RN, PT [place] Agency:  [facility] (formerly [employer])  Status of Service:  Completed, signed off  If discussed at [employer], dates discussed:    Additional Comments:  Erenest Rasher, RN 01/25/2018, 10:18 AM

## 2018-01-25 NOTE — Progress Notes (Signed)
12 Days Post-Op   Subjective/Chief Complaint: Doing well Ready to go home   Objective: Vital signs in last 24 hours: Temp:  [98.5 F (36.9 C)-99.1 F (37.3 C)] 98.8 F (37.1 C) (05/18 0437) Pulse Rate:  [58-64] 64 (05/18 0437) Resp:  [13] 13 (05/18 0437) BP: (105-140)/(51-74) 134/64 (05/18 0437) SpO2:  [99 %-100 %] 99 % (05/18 0437) Last BM Date: 01/24/18  Intake/Output from previous day: 05/17 0701 - 05/18 0700 In: 900 [P.O.:900] Out: 4565 [Urine:3500; Drains:40; Stool:1025] Intake/Output this shift: No intake/output data recorded.  Exam: Looks good Abdomen soft, ostomy pink  Lab Results:  No results for input(s): WBC, HGB, HCT, PLT in the last 72 hours. BMET Recent Labs    01/23/18 0412  NA 133*  K 3.9  CL 104  CO2 22  GLUCOSE 178*  BUN 7  CREATININE 1.37*  CALCIUM 7.7*   PT/INR No results for input(s): LABPROT, INR in the last 72 hours. ABG No results for input(s): PHART, HCO3 in the last 72 hours.  Invalid input(s): PCO2, PO2  Studies/Results: No results found.  Anti-infectives: Anti-infectives (From admission, onward)   Start     Dose/Rate Route Frequency Ordered Stop   01/14/18 0000  cefoTEtan (CEFOTAN) 2 g in sodium chloride 0.9 % 100 mL IVPB     2 g 200 mL/hr over 30 Minutes Intravenous Every 12 hours 01/13/18 1735 01/14/18 1211   01/13/18 1006  cefoTEtan in Dextrose 5% (CEFOTAN) IVPB 2 g     2 g Intravenous On call to O.R. 01/13/18 1006 01/13/18 1210   01/13/18 1006  neomycin (MYCIFRADIN) tablet 1,000 mg  Status:  Discontinued     1,000 mg Oral 3 times per day 01/13/18 1006 01/13/18 1008   01/13/18 1006  metroNIDAZOLE (FLAGYL) tablet 1,000 mg  Status:  Discontinued     1,000 mg Oral 3 times per day 01/13/18 1006 01/13/18 1008      Assessment/Plan: s/p Procedure(s): LAPAROSCOPIC ASSISTED   SIGMOID COLECTOMY ILEOSTOMY (N/A) CYSTOSCOPY WITH BILATERAL URETERAL CATHETER PLACEMENT (Bilateral) ILEOSTOMY  Discharge home today  LOS: 12 days     Janalyn Higby A 01/25/2018

## 2018-01-27 DIAGNOSIS — I130 Hypertensive heart and chronic kidney disease with heart failure and stage 1 through stage 4 chronic kidney disease, or unspecified chronic kidney disease: Secondary | ICD-10-CM | POA: Diagnosis not present

## 2018-01-27 DIAGNOSIS — Z433 Encounter for attention to colostomy: Secondary | ICD-10-CM | POA: Diagnosis not present

## 2018-01-27 DIAGNOSIS — Z48815 Encounter for surgical aftercare following surgery on the digestive system: Secondary | ICD-10-CM | POA: Diagnosis not present

## 2018-01-27 LAB — GLUCOSE, CAPILLARY: Glucose-Capillary: 173 mg/dL — ABNORMAL HIGH (ref 65–99)

## 2018-01-30 DIAGNOSIS — K651 Peritoneal abscess: Secondary | ICD-10-CM | POA: Diagnosis not present

## 2018-01-30 DIAGNOSIS — K631 Perforation of intestine (nontraumatic): Secondary | ICD-10-CM | POA: Diagnosis not present

## 2018-01-30 DIAGNOSIS — Z9049 Acquired absence of other specified parts of digestive tract: Secondary | ICD-10-CM | POA: Diagnosis not present

## 2018-02-04 DIAGNOSIS — Z48815 Encounter for surgical aftercare following surgery on the digestive system: Secondary | ICD-10-CM | POA: Diagnosis not present

## 2018-02-04 DIAGNOSIS — I130 Hypertensive heart and chronic kidney disease with heart failure and stage 1 through stage 4 chronic kidney disease, or unspecified chronic kidney disease: Secondary | ICD-10-CM | POA: Diagnosis not present

## 2018-02-04 DIAGNOSIS — Z433 Encounter for attention to colostomy: Secondary | ICD-10-CM | POA: Diagnosis not present

## 2018-02-06 DIAGNOSIS — Z433 Encounter for attention to colostomy: Secondary | ICD-10-CM | POA: Diagnosis not present

## 2018-02-06 DIAGNOSIS — Z48815 Encounter for surgical aftercare following surgery on the digestive system: Secondary | ICD-10-CM | POA: Diagnosis not present

## 2018-02-06 DIAGNOSIS — I130 Hypertensive heart and chronic kidney disease with heart failure and stage 1 through stage 4 chronic kidney disease, or unspecified chronic kidney disease: Secondary | ICD-10-CM | POA: Diagnosis not present

## 2018-02-10 DIAGNOSIS — I130 Hypertensive heart and chronic kidney disease with heart failure and stage 1 through stage 4 chronic kidney disease, or unspecified chronic kidney disease: Secondary | ICD-10-CM | POA: Diagnosis not present

## 2018-02-10 DIAGNOSIS — Z433 Encounter for attention to colostomy: Secondary | ICD-10-CM | POA: Diagnosis not present

## 2018-02-10 DIAGNOSIS — Z48815 Encounter for surgical aftercare following surgery on the digestive system: Secondary | ICD-10-CM | POA: Diagnosis not present

## 2018-02-12 DIAGNOSIS — R079 Chest pain, unspecified: Secondary | ICD-10-CM | POA: Diagnosis not present

## 2018-02-14 ENCOUNTER — Observation Stay (HOSPITAL_COMMUNITY)
Admission: EM | Admit: 2018-02-14 | Discharge: 2018-02-15 | Disposition: A | Discharge status: Home / Self Care | Payer: 59 | Attending: Internal Medicine | Admitting: Internal Medicine

## 2018-02-14 ENCOUNTER — Encounter (HOSPITAL_COMMUNITY): Payer: Self-pay

## 2018-02-14 ENCOUNTER — Other Ambulatory Visit: Payer: Self-pay

## 2018-02-14 ENCOUNTER — Emergency Department (HOSPITAL_COMMUNITY): Payer: 59

## 2018-02-14 ENCOUNTER — Observation Stay (HOSPITAL_COMMUNITY): Payer: 59

## 2018-02-14 DIAGNOSIS — J449 Chronic obstructive pulmonary disease, unspecified: Secondary | ICD-10-CM | POA: Diagnosis not present

## 2018-02-14 DIAGNOSIS — Z794 Long term (current) use of insulin: Secondary | ICD-10-CM

## 2018-02-14 DIAGNOSIS — I2510 Atherosclerotic heart disease of native coronary artery without angina pectoris: Secondary | ICD-10-CM | POA: Diagnosis not present

## 2018-02-14 DIAGNOSIS — N179 Acute kidney failure, unspecified: Secondary | ICD-10-CM | POA: Diagnosis present

## 2018-02-14 DIAGNOSIS — R7989 Other specified abnormal findings of blood chemistry: Secondary | ICD-10-CM | POA: Diagnosis not present

## 2018-02-14 DIAGNOSIS — I5032 Chronic diastolic (congestive) heart failure: Secondary | ICD-10-CM | POA: Insufficient documentation

## 2018-02-14 DIAGNOSIS — G8929 Other chronic pain: Secondary | ICD-10-CM | POA: Diagnosis not present

## 2018-02-14 DIAGNOSIS — R0789 Other chest pain: Principal | ICD-10-CM | POA: Insufficient documentation

## 2018-02-14 DIAGNOSIS — I110 Hypertensive heart disease with heart failure: Secondary | ICD-10-CM | POA: Diagnosis not present

## 2018-02-14 DIAGNOSIS — R0602 Shortness of breath: Secondary | ICD-10-CM | POA: Diagnosis not present

## 2018-02-14 DIAGNOSIS — R0781 Pleurodynia: Secondary | ICD-10-CM | POA: Diagnosis present

## 2018-02-14 DIAGNOSIS — R079 Chest pain, unspecified: Secondary | ICD-10-CM | POA: Diagnosis present

## 2018-02-14 DIAGNOSIS — N183 Chronic kidney disease, stage 3 unspecified: Secondary | ICD-10-CM | POA: Diagnosis present

## 2018-02-14 DIAGNOSIS — R071 Chest pain on breathing: Secondary | ICD-10-CM

## 2018-02-14 DIAGNOSIS — E119 Type 2 diabetes mellitus without complications: Secondary | ICD-10-CM | POA: Diagnosis not present

## 2018-02-14 DIAGNOSIS — Z7982 Long term (current) use of aspirin: Secondary | ICD-10-CM | POA: Insufficient documentation

## 2018-02-14 DIAGNOSIS — R7982 Elevated C-reactive protein (CRP): Secondary | ICD-10-CM | POA: Insufficient documentation

## 2018-02-14 DIAGNOSIS — I5030 Unspecified diastolic (congestive) heart failure: Secondary | ICD-10-CM | POA: Diagnosis present

## 2018-02-14 DIAGNOSIS — E1122 Type 2 diabetes mellitus with diabetic chronic kidney disease: Secondary | ICD-10-CM | POA: Diagnosis not present

## 2018-02-14 DIAGNOSIS — D649 Anemia, unspecified: Secondary | ICD-10-CM | POA: Diagnosis present

## 2018-02-14 DIAGNOSIS — I130 Hypertensive heart and chronic kidney disease with heart failure and stage 1 through stage 4 chronic kidney disease, or unspecified chronic kidney disease: Secondary | ICD-10-CM | POA: Insufficient documentation

## 2018-02-14 LAB — CBC
HCT: 26.4 % — ABNORMAL LOW (ref 39.0–52.0)
Hemoglobin: 8.6 g/dL — ABNORMAL LOW (ref 13.0–17.0)
MCH: 27 pg (ref 26.0–34.0)
MCHC: 32.6 g/dL (ref 30.0–36.0)
MCV: 82.8 fL (ref 78.0–100.0)
Platelets: 213 10*3/uL (ref 150–400)
RBC: 3.19 MIL/uL — ABNORMAL LOW (ref 4.22–5.81)
RDW: 14.4 % (ref 11.5–15.5)
WBC: 5.9 10*3/uL (ref 4.0–10.5)

## 2018-02-14 LAB — BASIC METABOLIC PANEL
Anion gap: 6 (ref 5–15)
BUN: 25 mg/dL — ABNORMAL HIGH (ref 6–20)
CO2: 22 mmol/L (ref 22–32)
Calcium: 8.7 mg/dL — ABNORMAL LOW (ref 8.9–10.3)
Chloride: 110 mmol/L (ref 101–111)
Creatinine, Ser: 2.26 mg/dL — ABNORMAL HIGH (ref 0.61–1.24)
GFR calc Af Amer: 34 mL/min — ABNORMAL LOW (ref >60)
GFR calc non Af Amer: 29 mL/min — ABNORMAL LOW (ref >60)
Glucose, Bld: 203 mg/dL — ABNORMAL HIGH (ref 65–99)
Potassium: 4.9 mmol/L (ref 3.5–5.1)
Sodium: 138 mmol/L (ref 135–145)

## 2018-02-14 LAB — CREATININE, URINE, RANDOM: Creatinine, Urine: 60.63 mg/dL

## 2018-02-14 LAB — GLUCOSE, CAPILLARY
Glucose-Capillary: 113 mg/dL — ABNORMAL HIGH (ref 65–99)
Glucose-Capillary: 145 mg/dL — ABNORMAL HIGH (ref 65–99)

## 2018-02-14 LAB — D-DIMER, QUANTITATIVE (NOT AT ARMC): D-Dimer, Quant: 1.17 ug/mL-FEU — ABNORMAL HIGH (ref 0.00–0.50)

## 2018-02-14 LAB — TROPONIN I
Troponin I: <0.03 ng/mL (ref <0.03)
Troponin I: <0.03 ng/mL (ref <0.03)
Troponin I: <0.03 ng/mL (ref <0.03)

## 2018-02-14 LAB — SODIUM, URINE, RANDOM: Sodium, Ur: 33 mmol/L

## 2018-02-14 LAB — CBG MONITORING, ED
Glucose-Capillary: 108 mg/dL — ABNORMAL HIGH (ref 65–99)
Glucose-Capillary: 125 mg/dL — ABNORMAL HIGH (ref 65–99)

## 2018-02-14 LAB — I-STAT TROPONIN, ED: Troponin i, poc: 0.01 ng/mL (ref 0.00–0.08)

## 2018-02-14 LAB — BRAIN NATRIURETIC PEPTIDE: B Natriuretic Peptide: 21.7 pg/mL (ref 0.0–100.0)

## 2018-02-14 MED ORDER — PREGABALIN 50 MG PO CAPS
100.0000 mg | ORAL_CAPSULE | Freq: Three times a day (TID) | ORAL | Status: DC
  Administered 2018-02-14 – 2018-02-15 (×5): 100 mg via ORAL
  Filled 2018-02-14: qty 4
  Filled 2018-02-14 (×4): qty 2

## 2018-02-14 MED ORDER — ROPINIROLE HCL 1 MG PO TABS
2.0000 mg | ORAL_TABLET | Freq: Every day | ORAL | Status: DC
  Administered 2018-02-14: 2 mg via ORAL
  Filled 2018-02-14: qty 4

## 2018-02-14 MED ORDER — SODIUM CHLORIDE 0.9% FLUSH
3.0000 mL | Freq: Two times a day (BID) | INTRAVENOUS | Status: DC
  Administered 2018-02-14 – 2018-02-15 (×2): 3 mL via INTRAVENOUS

## 2018-02-14 MED ORDER — PANTOPRAZOLE SODIUM 40 MG PO TBEC
40.0000 mg | DELAYED_RELEASE_TABLET | Freq: Every day | ORAL | Status: DC
  Administered 2018-02-14 – 2018-02-15 (×2): 40 mg via ORAL
  Filled 2018-02-14 (×2): qty 1

## 2018-02-14 MED ORDER — ACETAMINOPHEN 650 MG RE SUPP: 650.0000 mg | Freq: Four times a day (QID) | RECTAL | Status: DC | PRN

## 2018-02-14 MED ORDER — ADULT MULTIVITAMIN W/MINERALS CH
1.0000 | ORAL_TABLET | Freq: Every day | ORAL | Status: DC
  Administered 2018-02-14 – 2018-02-15 (×2): 1 via ORAL
  Filled 2018-02-14 (×2): qty 1

## 2018-02-14 MED ORDER — TECHNETIUM TC 99M DIETHYLENETRIAME-PENTAACETIC ACID
32.8000 | Freq: Once | INTRAVENOUS | Status: AC | PRN
  Administered 2018-02-14: 32.8 via RESPIRATORY_TRACT

## 2018-02-14 MED ORDER — ATORVASTATIN CALCIUM 40 MG PO TABS
40.0000 mg | ORAL_TABLET | Freq: Every day | ORAL | Status: DC
  Administered 2018-02-14 – 2018-02-15 (×2): 40 mg via ORAL
  Filled 2018-02-14 (×2): qty 1

## 2018-02-14 MED ORDER — ASPIRIN 81 MG PO CHEW
324.0000 mg | CHEWABLE_TABLET | Freq: Once | ORAL | Status: AC
Start: 2018-02-14 — End: 2018-02-14
  Administered 2018-02-14: 324 mg via ORAL
  Filled 2018-02-14: qty 4

## 2018-02-14 MED ORDER — SODIUM CHLORIDE 0.9 % IV SOLN
INTRAVENOUS | Status: DC
  Administered 2018-02-14 – 2018-02-15 (×2): via INTRAVENOUS

## 2018-02-14 MED ORDER — INSULIN ASPART 100 UNIT/ML ~~LOC~~ SOLN
0.0000 [IU] | SUBCUTANEOUS | Status: DC
  Administered 2018-02-14 – 2018-02-15 (×3): 1 [IU] via SUBCUTANEOUS
  Administered 2018-02-15: 2 [IU] via SUBCUTANEOUS
  Filled 2018-02-14: qty 1

## 2018-02-14 MED ORDER — POLYVINYL ALCOHOL 1.4 % OP SOLN
2.0000 [drp] | Freq: Every day | OPHTHALMIC | Status: DC | PRN
  Filled 2018-02-14: qty 15

## 2018-02-14 MED ORDER — OXYCODONE-ACETAMINOPHEN 5-325 MG PO TABS
1.0000 | ORAL_TABLET | Freq: Once | ORAL | Status: AC
  Administered 2018-02-14: 1 via ORAL
  Filled 2018-02-14: qty 1

## 2018-02-14 MED ORDER — PROPYLENE GLYCOL 0.6 % OP SOLN: 2.0000 [drp] | Freq: Every day | OPHTHALMIC | Status: DC | PRN

## 2018-02-14 MED ORDER — ASPIRIN EC 81 MG PO TBEC
81.0000 mg | DELAYED_RELEASE_TABLET | Freq: Every day | ORAL | Status: DC
  Administered 2018-02-14 – 2018-02-15 (×2): 81 mg via ORAL
  Filled 2018-02-14 (×2): qty 1

## 2018-02-14 MED ORDER — INSULIN GLARGINE 100 UNIT/ML ~~LOC~~ SOLN
40.0000 [IU] | Freq: Every day | SUBCUTANEOUS | Status: DC
  Administered 2018-02-14: 40 [IU] via SUBCUTANEOUS
  Filled 2018-02-14 (×2): qty 0.4

## 2018-02-14 MED ORDER — HEPARIN SODIUM (PORCINE) 5000 UNIT/ML IJ SOLN
5000.0000 [IU] | Freq: Three times a day (TID) | INTRAMUSCULAR | Status: DC
  Administered 2018-02-14 – 2018-02-15 (×4): 5000 [IU] via SUBCUTANEOUS
  Filled 2018-02-14 (×4): qty 1

## 2018-02-14 MED ORDER — FENTANYL 25 MCG/HR TD PT72: 50.0000 ug | MEDICATED_PATCH | TRANSDERMAL | Status: DC

## 2018-02-14 MED ORDER — FENTANYL 25 MCG/HR TD PT72
50.0000 ug | MEDICATED_PATCH | TRANSDERMAL | Status: DC
  Administered 2018-02-14: 50 ug via TRANSDERMAL
  Filled 2018-02-14: qty 2

## 2018-02-14 MED ORDER — OMEGA-3-ACID ETHYL ESTERS 1 G PO CAPS
2.0000 g | ORAL_CAPSULE | Freq: Two times a day (BID) | ORAL | Status: DC
  Administered 2018-02-14 – 2018-02-15 (×3): 2 g via ORAL
  Filled 2018-02-14 (×4): qty 2

## 2018-02-14 MED ORDER — PREGABALIN 100 MG PO CAPS
100.0000 mg | ORAL_CAPSULE | Freq: Once | ORAL | Status: AC
  Administered 2018-02-14: 100 mg via ORAL
  Filled 2018-02-14: qty 1

## 2018-02-14 MED ORDER — TAMSULOSIN HCL 0.4 MG PO CAPS
0.4000 mg | ORAL_CAPSULE | Freq: Every day | ORAL | Status: DC
  Administered 2018-02-14 – 2018-02-15 (×2): 0.4 mg via ORAL
  Filled 2018-02-14 (×2): qty 1

## 2018-02-14 MED ORDER — SODIUM CHLORIDE 0.9 % IV SOLN
INTRAVENOUS | Status: DC
  Administered 2018-02-14: 07:00:00 via INTRAVENOUS

## 2018-02-14 MED ORDER — METOCLOPRAMIDE HCL 10 MG PO TABS
10.0000 mg | ORAL_TABLET | Freq: Three times a day (TID) | ORAL | Status: DC
  Administered 2018-02-14 – 2018-02-15 (×5): 10 mg via ORAL
  Filled 2018-02-14 (×7): qty 1

## 2018-02-14 MED ORDER — TECHNETIUM TO 99M ALBUMIN AGGREGATED
4.3000 | Freq: Once | INTRAVENOUS | Status: AC | PRN
  Administered 2018-02-14: 4.3 via INTRAVENOUS

## 2018-02-14 MED ORDER — OXYCODONE-ACETAMINOPHEN 5-325 MG PO TABS
1.0000 | ORAL_TABLET | Freq: Two times a day (BID) | ORAL | Status: DC | PRN
Start: 2018-02-14 — End: 2018-02-15
  Administered 2018-02-14 – 2018-02-15 (×3): 1 via ORAL
  Filled 2018-02-14 (×3): qty 1

## 2018-02-14 MED ORDER — ONDANSETRON HCL 4 MG/2ML IJ SOLN: 4.0000 mg | Freq: Four times a day (QID) | INTRAMUSCULAR | Status: DC | PRN

## 2018-02-14 MED ORDER — NITROGLYCERIN 0.4 MG SL SUBL
0.4000 mg | SUBLINGUAL_TABLET | SUBLINGUAL | Status: DC | PRN
  Administered 2018-02-14 (×2): 0.4 mg via SUBLINGUAL
  Filled 2018-02-14: qty 1

## 2018-02-14 MED ORDER — ACETAMINOPHEN 325 MG PO TABS: 650.0000 mg | ORAL_TABLET | Freq: Four times a day (QID) | ORAL | Status: DC | PRN

## 2018-02-14 MED ORDER — CLOPIDOGREL BISULFATE 75 MG PO TABS
75.0000 mg | ORAL_TABLET | Freq: Every day | ORAL | Status: DC
  Administered 2018-02-14 – 2018-02-15 (×2): 75 mg via ORAL
  Filled 2018-02-14 (×2): qty 1

## 2018-02-14 MED ORDER — ONDANSETRON HCL 4 MG PO TABS: 4.0000 mg | ORAL_TABLET | Freq: Four times a day (QID) | ORAL | Status: DC | PRN

## 2018-02-14 MED ORDER — HYDROMORPHONE HCL 2 MG PO TABS: 2.0000 mg | ORAL_TABLET | Freq: Every evening | ORAL | Status: DC | PRN

## 2018-02-14 MED ORDER — ISOSORBIDE MONONITRATE ER 60 MG PO TB24
120.0000 mg | ORAL_TABLET | Freq: Every day | ORAL | Status: DC
Start: 2018-02-14 — End: 2018-02-15
  Administered 2018-02-14 – 2018-02-15 (×2): 120 mg via ORAL
  Filled 2018-02-14 (×2): qty 2

## 2018-02-14 MED ORDER — COLLAGENASE 250 UNIT/GM EX OINT
1.0000 "application " | TOPICAL_OINTMENT | Freq: Every day | CUTANEOUS | Status: DC
  Administered 2018-02-15: 1 via TOPICAL
  Filled 2018-02-14: qty 30

## 2018-02-14 NOTE — ED Notes (Signed)
Pt to US.

## 2018-02-14 NOTE — ED Notes (Signed)
Patient transported to nuclear medicine 

## 2018-02-14 NOTE — Progress Notes (Signed)
PROGRESS NOTE                                                                                                                                                                                                             Patient Demographics:    [name], is a 62 y.o. male, DOB - [birth year], FAO:[id number]  Admit date - 02/14/2018   Admitting Physician Harold Bulls, MD  Outpatient Primary MD for the patient is [name], [name], DO  LOS - 0   Chief Complaint  Patient presents with  . Chest Pain       Brief Narrative   62 y.o. male with medical history significant for hypertension, insulin-dependent diabetes mellitus, chronic diastolic CHF, coronary artery disease, foot ulcer status post right BKA, and recent diverticulitis with perforation status post sigmoid colectomy, now presenting to the emergency department for evaluation of pleuritic chest pain and elevated D-dimers.     Subjective:    [name] today has, No headache, reports reproducible chest pain, No abdominal pain - No Nausea,No Cough - SOB.    Assessment  & Plan :    Principal Problem:   Chest pain Active Problems:   CAD (dz of distal, mid and proximal RCA with implantation of 3 overlapping drug-eluting stent,)   Chronic diastolic heart failure, NYHA class 2 (HCC)   Type 2 diabetes mellitus with insulin therapy ([place])   Hypertension   Acute renal failure superimposed on stage 3 chronic kidney disease (HCC)   Elevated d-dimer   Pleuritic chest pain   Chronic pain   Normocytic anemia   1. Pleuritic chest pain; elevated d-dimer; CAD  - Presents with several days of pleuritic chest pain and mild SOB; was evaluated by PCP, found to have elevated d-dimer, and sent to ED , he is with renal failure, antiallergy, so VQ scan was obtained which was very low probability, chest pain appears to be nontypical, musculoskeletal quality reproducible by palpation, happens at rest,  will cycle 3 sets of cardiac enzymes, will discuss with his primary cardiologist [name] to see as an outpatient if any further work-up is indicated.  2. Acute kidney injury superimposed on CKD stage III  - SCr is 2.26 on admission, up from 1.37 two weeks earlier  - Denies recent vomiting, diarrhea, or poor appetite  - Check renal  US and urine chemistries  - Start gentle IVF hydration, renally-dose medications, and avoid nephrotoxins   -Continue to hold torsemide and Toprol, continue with gentle hydration  3. Insulin-dependent DM  - A1c was 7.1% in April  - Managed at home with Lantus 60 units qHS and Humalog 15 units TID  - Check CBG's and start with Lantus 40 units qHS and Novolog correctional while in hospital    4. Chronic pain  - No pain complaints on admission  - Continue home-regimen with fentanyl patch, Lyrica, prn Percocet, and prn Dilaudid    5. Chronic diastolic CHF  - Appears euvolemic on admission  - Holding torsemide and ACE while providing gentle IVF hydration in light of AKI on CKD III  - Follow daily wt and I/O's         Code Status : Full  Family Communication  : None at bedside  Disposition Plan  : Home when stable  Consults  :  None  Procedures  : None  DVT Prophylaxis  :  Nicolaus heaprin  Lab Results  Component Value Date   PLT 213 02/14/2018    Antibiotics  :    Anti-infectives (From admission, onward)   None        Objective:   Vitals:   02/14/18 1345 02/14/18 1400 02/14/18 1415 02/14/18 1538  BP: (!) 158/64 (!) 195/84 (!) 170/50 (!) 162/66  Pulse: 63 76 (!) 57 64  Resp:      Temp:    98.2 F (36.8 C)  TempSrc:    Oral  SpO2: 99% 100% 100% 99%    Wt Readings from Last 3 Encounters:  01/13/18 111.8 kg (246 lb 6 oz)  01/07/18 111.8 kg (246 lb 6 oz)  01/03/18 113.4 kg (250 lb)    No intake or output data in the 24 hours ending 02/14/18 1611   Physical Exam  Awake Alert, Oriented X 3, No new F.N deficits, Normal  affect Symmetrical Chest wall movement, Good air movement bilaterally, CTAB, has reproducible chest pain on palpation RRR,No Gallops,Rubs or new Murmurs, No Parasternal Heave +ve B.Sounds, Abd Soft, No tenderness,  RLQ colostomy +, midline surgical wound healing with no discharge or oozing, No rebound - guarding or rigidity. Status-post right BKA.      Data Review:    CBC Recent Labs  Lab 02/14/18 0036  WBC 5.9  HGB 8.6*  HCT 26.4*  PLT 213  MCV 82.8  MCH 27.0  MCHC 32.6  RDW 14.4    Chemistries  Recent Labs  Lab 02/14/18 0036  NA 138  K 4.9  CL 110  CO2 22  GLUCOSE 203*  BUN 25*  CREATININE 2.26*  CALCIUM 8.7*   ------------------------------------------------------------------------------------------------------------------ No results for input(s): CHOL, HDL, LDLCALC, TRIG, CHOLHDL, LDLDIRECT in the last 72 hours.  Lab Results  Component Value Date   HGBA1C 7.1 (H) 01/07/2018   ------------------------------------------------------------------------------------------------------------------ No results for input(s): TSH, T4TOTAL, T3FREE, THYROIDAB in the last 72 hours.  Invalid input(s): FREET3 ------------------------------------------------------------------------------------------------------------------ No results for input(s): VITAMINB12, FOLATE, FERRITIN, TIBC, IRON, RETICCTPCT in the last 72 hours.  Coagulation profile No results for input(s): INR, PROTIME in the last 168 hours.  Recent Labs    02/14/18 0217  DDIMER 1.17*    Cardiac Enzymes Recent Labs  Lab 02/14/18 0826  TROPONINI <0.03   ------------------------------------------------------------------------------------------------------------------    Component Value Date/Time   BNP 21.7 02/14/2018 0036    Inpatient Medications  Scheduled Meds: . aspirin EC  81 mg Oral  Daily  . atorvastatin  40 mg Oral q1800  . clopidogrel  75 mg Oral Daily  . collagenase  1 application  Topical Daily  . [START ON 02/15/2018] fentaNYL  50 mcg Transdermal Q72H  . heparin  5,000 Units Subcutaneous Q8H  . insulin aspart  0-9 Units Subcutaneous Q4H  . insulin glargine  40 Units Subcutaneous QHS  . isosorbide mononitrate  120 mg Oral Daily  . metoCLOPramide  10 mg Oral TID WC  . multivitamin with minerals  1 tablet Oral Daily  . omega-3 acid ethyl esters  2 g Oral BID  . pantoprazole  40 mg Oral QAC breakfast  . pregabalin  100 mg Oral TID  . rOPINIRole  2 mg Oral QHS  . sodium chloride flush  3 mL Intravenous Q12H  . tamsulosin  0.4 mg Oral Daily   Continuous Infusions: PRN Meds:.acetaminophen **OR** acetaminophen, HYDROmorphone, ondansetron **OR** ondansetron (ZOFRAN) IV, oxyCODONE-acetaminophen, polyvinyl alcohol  Micro Results No results found for this or any previous visit (from the past 240 hour(s)).  Radiology Reports Dg Chest 2 View  Result Date: 02/14/2018 CLINICAL DATA:  Acute onset of generalized chest pain. Elevated D-dimer. Shortness of breath, nausea and dizziness. EXAM: CHEST - 2 VIEW COMPARISON:  Chest radiograph performed 01/18/2018 FINDINGS: The lungs are well-aerated. Mild peribronchial thickening is noted. Mild bibasilar opacities may reflect atelectasis or mild pneumonia. There is no evidence of pleural effusion or pneumothorax. The heart is normal in size; the mediastinal contour is within normal limits. No acute osseous abnormalities are seen. IMPRESSION: Mild peribronchial thickening. Mild bibasilar opacities may reflect atelectasis or mild pneumonia. Electronically Signed   By: Garald Balding M.D.   On: 02/14/2018 01:10   Dg Abd 1 View  Result Date: 01/18/2018 CLINICAL DATA:  Check nasogastric catheter placement EXAM: ABDOMEN - 1 VIEW COMPARISON:  Film from earlier in the same day. FINDINGS: Nasogastric catheter has now been placed within the stomach. There remains mild small bowel dilatation. Degenerative changes of lumbar spine are noted. IMPRESSION:  Nasogastric catheter within the stomach. Electronically Signed   By: Inez Catalina M.D.   On: 01/18/2018 19:08   US Renal  Result Date: 02/14/2018 CLINICAL DATA:  Acute renal insufficiency superimposed on stage III chronic renal disease EXAM: RENAL / URINARY TRACT ULTRASOUND COMPLETE COMPARISON:  None. FINDINGS: Right Kidney: Length: 12.3 cm. Echogenicity is increased. Renal cortical thickness is within normal limits. No mass, perinephric fluid, or hydronephrosis visualized. No sonographically demonstrable calculus or ureterectasis. Left Kidney: Length: 12.5 cm. Echogenicity is slightly increased. Renal cortical thickness is normal. No mass, perinephric fluid, or hydronephrosis visualized. No sonographically demonstrable calculus or ureterectasis. Bladder: Appears normal for degree of bladder distention. Penile prosthesis reservoir is present adjacent to the bladder. Reservoir appears intact. IMPRESSION: Kidneys appear somewhat echogenic, a finding indicative of medical renal disease. Renal cortical thickness normal bilaterally. No obstructing focus evident on either side. Kidneys otherwise appear unremarkable. Intact penile prosthesis reservoir noted in the pelvis. Electronically Signed   By: Lowella Grip III M.D.   On: 02/14/2018 08:51   Nm Pulmonary Perf And Vent  Result Date: 02/14/2018 CLINICAL DATA:  Chest pain and shortness of breath EXAM: NUCLEAR MEDICINE VENTILATION - PERFUSION LUNG SCAN VIEWS: Anterior, posterior, left lateral, right lateral, RPO, LPO, RAO, LAO-ventilation and perfusion RADIOPHARMACEUTICALS:  32.8 mCi of Tc-34m DTPA aerosol inhalation and 4.3 mCi Tc78m-MAA IV COMPARISON:  Chest radiograph February 14, 2018 FINDINGS: Ventilation: Radiotracer uptake is homogeneous and symmetric bilaterally. No appreciable ventilation defects.  Perfusion: Radiotracer uptake is homogeneous and symmetric bilaterally. There are no appreciable perfusion defects. IMPRESSION: No appreciable ventilation or  perfusion defects. This study constitutes a very low probability of pulmonary embolus. Electronically Signed   By: Lowella Grip III M.D.   On: 02/14/2018 14:24   Dg Abd Acute W/chest  Result Date: 01/18/2018 CLINICAL DATA:  Ileus, history hypertension, diabetes mellitus, GERD, COPD, CHF EXAM: DG ABDOMEN ACUTE W/ 1V CHEST COMPARISON:  Chest radiograph 01/14/2018, abdominal radiograph 01/14/2018 FINDINGS: Enlargement of cardiac silhouette. Atherosclerotic calcification aorta. Lungs clear. No pleural effusion or pneumothorax. Interval removal of nasogastric tube. Prior lumbosacral fusion L4-S1. Diffuse dilatation of small bowel loops throughout the abdomen. Anterior skin clips at pelvis. Paucity of colonic gas. Findings favor postoperative ileus. No bowel wall thickening or free air. Bones demineralized with scattered degenerative disc disease changes of the thoracolumbar spine. IMPRESSION: Probable postoperative ileus. Electronically Signed   By: Lavonia Dana M.D.   On: 01/18/2018 12:59    Time Spent in minutes : This is a no charge note as patient was admitted earlier today.   Phillips Climes M.D on 02/14/2018 at 4:11 PM  Between 7am to 7pm - Pager - [phone]  After 7pm go to [email or web address] - password [facility]  [facility] -  Office  [phone]

## 2018-02-14 NOTE — ED Triage Notes (Signed)
Pt states that he began to have CP for the past six days and went to see his PCP yesterday, was told to come to the ED due to high D-dimer and need a CT scan. Some SOB, nausea, dizziness, radiation to back

## 2018-02-14 NOTE — H&P (Signed)
History and Physical    [name]:[id number] DOB: [birth year] DOA: 02/14/2018  PCP: Harold Sakai, DO   Patient coming from: Home  Chief Complaint: Chest pain   HPI: [name] is a 62 y.o. male with medical history significant for hypertension, insulin-dependent diabetes mellitus, chronic diastolic CHF, coronary artery disease, foot ulcer status post right BKA, and recent diverticulitis with perforation status post sigmoid colectomy, now presenting to the emergency department for evaluation of chest pain.  The patient reports that developed discomfort in the left chest several days ago, worse with certain movements or deep inspiration.  He reports some mild dyspnea associated with this, but denies significant cough.  Denies fevers or chills.  He also denies leg swelling or tenderness.  He was evaluated for these complaints by his PCP and reports having an elevated d-dimer on outpatient blood work, for which she was directed to the ED.  ED Course: Upon arrival to the ED, patient is found to be afebrile, saturating well on room air, and with vitals otherwise normal.  EKG features a sinus rhythm with RBBB and LAFB, similar to prior.  Chest x-ray is notable for mild peribronchial thickening and mild bibasilar opacities suggestive of atelectasis versus pneumonia.  Chemistry panel is concerning for a creatinine of 2.26, up from 1.37 two weeks ago.  CBC features a stable normocytic anemia with hemoglobin of 8.6.  Troponin is normal and BNP is also normal.  Patient was treated with 324 mg of aspirin and analgesia in the ED.  He remains hemodynamically stable, in no acute respiratory distress, and will be observed on the telemetry unit for ongoing evaluation and management.  Review of Systems:  All other systems reviewed and apart from HPI, are negative.  Past Medical History:  Diagnosis Date  . Barrett's esophagus   . Carotid artery occlusion 11/10/10   LEFT CAROTID ENDARTERECTOMY    . CHF (congestive heart failure) ([place])   . Complication of anesthesia    BP WENT UP AT [facility] "  . COPD (chronic obstructive pulmonary disease) ([place])   . Coronary artery disease   . Diabetes mellitus   . Diverticulitis   . Diverticulosis of colon (without mention of hemorrhage)   . DJD (degenerative joint disease)   . Fatty liver   . Full dentures   . GERD (gastroesophageal reflux disease)   . H/O Doppler ultrasound 06/28/2012   Lower venous duplex left-- findings consistent with acute deep vein thrombosis involving the personal vein of the left lower extremity. No evidence of Baker's cyst on the left. Duplex scan of the left lower extremity arteries reveal adequate blood flow.   . H/O hiatal hernia   . History of cardiovascular stress test 03/23/2011   ejection fraction measured 51%, with an end-diastolic volume of 979 ml and an end-systolic volume of 81 ml. no evidence of myocardial ischemia.  Marland Kitchen History of echocardiogram 08/22/2011   Mildly hypertrophic left ventricle with normal systolic function and mild diastolic dysfunction. mildly dilated left atrium. no significant valvular abnormalities. no pericardial effusion no change from previous study 10/2010. the ejection fraction =>55%  . History of Holter monitoring 02/14/2012   Scan quality is fair due to baseline artifact. Predominant rhythm is Sinus. Veentricular Ectopy was noted in single beat form. The total recording time was 24:00 hours. total beats =[id number]. the total time analyzed was 23:58 hours. Heart  Max = 102 at 1:18:22, Min= 52 at 9:56:19 am., Avg= 70. There were 0 pause(s) >  2.5 seconds. the longest pause = 0.00 sec. at 1:51 pm(1)  . Hyperlipidemia   . Hypertension   . Neuromuscular disorder ([place])    peripheral neuropathy  . OSA (obstructive sleep apnea) 12/26/2008   took himself off CPAP; AHI of 6.3/hr overall, 32.7/hr during REM sleep and an RDI of 24.8/hr and 50.9/hr.  . Osteomyelitis ([place])    left 5th metatarsal  . PAD  (peripheral artery disease) ([place])    Distal aortogram June 2012. Atherectomy left popliteal artery July 2012.   Marland Kitchen Slurred speech    AS PER WIFE IN D/C NOTE 11/10/10  . Substernal chest pain   . Visual color changes    LEFT EYE; BUT NOT TOTAL BLINDNESS  . Wears glasses     Past Surgical History:  Procedure Laterality Date  . AMPUTATION  11/05/2011   Procedure: AMPUTATION RAY;  Surgeon: Wylene Simmer, MD;  Location: [place];  Service: Orthopedics;  Laterality: Right;  Amputation of Right 4&5th Toes  . AMPUTATION Left 11/26/2012   Procedure: AMPUTATION RAY;  Surgeon: Wylene Simmer, MD;  Location: [place];  Service: Orthopedics;  Laterality: Left;  fourth ray amputation  . AMPUTATION Right 08/27/2014   Procedure: Transmetatarsal Amputation;  Surgeon: Newt Minion, MD;  Location: [place];  Service: Orthopedics;  Laterality: Right;  . AMPUTATION Right 01/14/2015   Procedure: AMPUTATION BELOW KNEE;  Surgeon: Newt Minion, MD;  Location: [place];  Service: Orthopedics;  Laterality: Right;  . AMPUTATION Left 10/21/2015   Procedure: Left Foot 5th Ray Amputation;  Surgeon: Newt Minion, MD;  Location: [place];  Service: Orthopedics;  Laterality: Left;  . ANTERIOR FUSION CERVICAL SPINE  02/06/06   C4-5, C5-6, C6-7; SURGEON DR. MAX COHEN  . CARDIAC CATHETERIZATION  10/31/04   2009  . CAROTID ENDARTERECTOMY  11/10/10  . CAROTID ENDARTERECTOMY Left 11/10/2010   Subtotal occlusion of left internal carotid artery with left hemispheric transient ischemic attacks.  . CARPAL TUNNEL RELEASE Right 10/21/2013   Procedure: RIGHT CARPAL TUNNEL RELEASE;  Surgeon: Wynonia Sours, MD;  Location: [place];  Service: Orthopedics;  Laterality: Right;  . CHOLECYSTECTOMY    . COLONOSCOPY    . CYSTOSCOPY WITH STENT PLACEMENT Bilateral 01/13/2018   Procedure: CYSTOSCOPY WITH BILATERAL URETERAL CATHETER PLACEMENT;  Surgeon: Ardis Hughs, MD;  Location: [facility];  Service: Urology;  Laterality: Bilateral;  . ESOPHAGEAL  MANOMETRY Bilateral 07/19/2014   Procedure: ESOPHAGEAL MANOMETRY (EM);  Surgeon: Jerene Bears, MD;  Location: [facility];  Service: Gastroenterology;  Laterality: Bilateral;  . FEMORAL ARTERY STENT     x6  . FOOT SURGERY  04/25/2016    EXCISION BASE 5TH METATARSAL AND PARTIAL CUBOID LEFT FOOT  . HERNIA REPAIR     LEFT INGUINAL AND UMBILICAL REPAIRS  . I&D EXTREMITY Left 04/25/2016   Procedure: EXCISION BASE 5TH METATARSAL AND PARTIAL CUBOID LEFT FOOT;  Surgeon: Newt Minion, MD;  Location: [place];  Service: Orthopedics;  Laterality: Left;  . ILEOSTOMY  01/13/2018   Procedure: ILEOSTOMY;  Surgeon: Clovis Riley, MD;  Location: [facility];  Service: General;;  . IR RADIOLOGIST EVAL & MGMT  11/19/2017  . IR RADIOLOGIST EVAL & MGMT  12/03/2017  . IR RADIOLOGIST EVAL & MGMT  12/18/2017  . JOINT REPLACEMENT Right 2001   Total  . LAMINECTOMY     X 3 LUMBAR AND X 2 CERVICAL SPINE OPERATIONS  . LAPAROSCOPIC CHOLECYSTECTOMY W/ CHOLANGIOGRAPHY  11/09/04   SURGEON DR. PAUL S.  TOTH  . LEFT HEART CATHETERIZATION WITH CORONARY ANGIOGRAM N/A 10/29/2014   Procedure: LEFT HEART CATHETERIZATION WITH CORONARY ANGIOGRAM;  Surgeon: Laverda Page, MD;  Location: [facility] C[facility];  Service: Cardiovascular;  Laterality: N/A;  . LOWER EXTREMITY ANGIOGRAM N/A 03/19/2012   Procedure: LOWER EXTREMITY ANGIOGRAM;  Surgeon: Burnell Blanks, MD;  Location: [facility] CATH LAB;  Service: Cardiovascular;  Laterality: N/A;  . NECK SURGERY    . PARTIAL COLECTOMY N/A 01/13/2018   Procedure: LAPAROSCOPIC ASSISTED   SIGMOID COLECTOMY ILEOSTOMY;  Surgeon: Clovis Riley, MD;  Location: [facility];  Service: General;  Laterality: N/A;  . PENILE PROSTHESIS IMPLANT  08/14/05   INFRAPUBIC INSERTION OF INFLATABLE PENILE PROSTHESIS; SURGEON DR. Amalia Hailey  . PERCUTANEOUS CORONARY STENT INTERVENTION (PCI-S) Right 10/29/2014   Procedure: PERCUTANEOUS CORONARY STENT INTERVENTION (PCI-S);  Surgeon: Laverda Page, MD;  [facility];  Service:  Cardiovascular;  Laterality: Right;  . SHOULDER ARTHROSCOPY    . SPINE SURGERY    . TONSILLECTOMY    . TOTAL KNEE ARTHROPLASTY  07/2002   RIGHT KNEE ; SURGEON  DR. [name] ALSO HAD ARTHROSCOPIC RIGHT KNEE IN  10/2001  . ULNAR NERVE TRANSPOSITION Right 10/21/2013   Procedure: RIGHT ELBOW  ULNAR NERVE DECOMPRESSION;  Surgeon: Wynonia Sours, MD;  Location: [place];  Service: Orthopedics;  Laterality: Right;     reports that he has been smoking e-cigarettes.  He has a 17.50 pack-year smoking history. He has never used smokeless tobacco. He reports that he does not drink alcohol or use drugs.  Allergies  Allergen Reactions  . Ivp Dye [Iodinated Diagnostic Agents] Anaphylaxis and Other (See Comments)    Breathing problems   . Adhesive [Tape] Rash    Rash after 2-3 days use    Family History  Problem Relation Age of Onset  . Heart disease Father        Before age 74-  CAD, BPG  . Diabetes Father        Amputation  . Cancer Father        PROSTATE  . Hyperlipidemia Father   . Hypertension Father   . Heart attack Father        Triple BPG  . Varicose Veins Father   . Colon cancer Brother   . Cancer Brother        Colon  . Diabetes Brother   . Heart disease Brother 57       A-Fib. Before age 69  . Hyperlipidemia Brother   . Hypertension Brother   . Cancer Sister        Breast  . Hyperlipidemia Sister   . Hypertension Sister   . Hypertension Son   . Arthritis Unknown        GRANDMOTHER  . Hypertension Unknown        OTHER FAMILY MEMBERS     Prior to Admission medications   Medication Sig Start Date End Date Taking? Authorizing Provider  aspirin EC 81 MG tablet Take 81 mg by mouth daily.    [provider]  atorvastatin (LIPITOR) 40 MG tablet Take 40 mg by mouth daily.    [provider]  carvedilol (COREG) 25 MG tablet Take 1.5 tablets (37.5 mg total) by mouth 2 (two) times daily. 08/01/16   Adrian Prows, MD  clobetasol (TEMOVATE) 0.05 %  external solution Apply 1 application topically daily as needed (dry scalp).    [provider]  clopidogrel (PLAVIX) 75 MG tablet Take 75  mg by mouth daily.  03/28/12   [provider]  clotrimazole-betamethasone (LOTRISONE) cream Apply 1 application topically daily as needed (dry skin - beard).     [provider]  collagenase (SANTYL) ointment Apply topically daily. Patient taking differently: Apply 1 application topically daily. TO AFFECTED SITE(S) 09/06/15   Lavina Hamman, MD  fentaNYL (DURAGESIC - DOSED MCG/HR) 50 MCG/HR Place 1 patch (50 mcg total) onto the skin every 3 (three) days. 05/10/16   Orson Eva, MD  fluticasone (CUTIVATE) 0.05 % cream Apply 1 application topically 2 (two) times daily as needed (dry skin on face).     [provider]  HUMALOG KWIKPEN 100 UNIT/ML KiwkPen Inject 15 Units into the skin 3 (three) times daily with meals. 15 units with breakfast, lunch, & dinner-- pt may increase dose by 1 unit if sugar is elevated 11/14/17   [provider]  HYDROmorphone (DILAUDID) 2 MG tablet Take 1 tablet (2 mg total) by mouth at bedtime. Patient taking differently: Take 2 mg by mouth at bedtime as needed (for breakthrough pain).  05/10/16   Orson Eva, MD  Insulin Glargine (BASAGLAR KWIKPEN) 100 UNIT/ML SOPN Inject 60 Units into the skin at bedtime. 12/17/17   [provider]  isosorbide mononitrate (IMDUR) 120 MG 24 hr tablet Take 120 mg by mouth daily.    [provider]  lisinopril (PRINIVIL,ZESTRIL) 20 MG tablet Take 20 mg by mouth daily. 12/17/17   [provider]  metFORMIN (GLUCOPHAGE) 1000 MG tablet Take 1,000 mg by mouth 2 (two) times daily. 12/18/17   [provider]  metoCLOPramide (REGLAN) 10 MG tablet Take 1 tablet (10 mg total) by mouth 3 (three) times daily with meals. 01/24/18 01/24/19  Clovis Riley, MD  Multiple Vitamin (MULTIVITAMIN WITH MINERALS) TABS tablet Take 1 tablet by mouth daily.     [provider]  nitroGLYCERIN (NITRO-BID) 2 % ointment Apply 0.75 inches topically daily as needed for chest pain.     [provider]  nitroGLYCERIN (NITRODUR - DOSED IN MG/24 HR) 0.2 mg/hr patch Place 1 patch (0.2 mg total) onto the skin daily. Patient taking differently: Place 0.2 mg onto the skin daily as needed (chest pain).  01/08/17   Suzan Slick, NP  nitroGLYCERIN (NITROSTAT) 0.4 MG SL tablet Place 1 tablet (0.4 mg total) under the tongue every 5 (five) minutes as needed (as needed for esophageal spasm). Patient taking differently: Place 0.4 mg under the tongue every 5 (five) minutes as needed for chest pain.  08/09/14   Pyrtle, Lajuan Lines, MD  omega-3 acid ethyl esters (LOVAZA) 1 G capsule Take 2 g by mouth 2 (two) times daily.    [provider]  ondansetron (ZOFRAN-ODT) 4 MG disintegrating tablet Take 4 mg by mouth 4 (four) times daily as needed for nausea.    [provider]  oxyCODONE-acetaminophen (PERCOCET/ROXICET) 5-325 MG tablet Take 1 tablet by mouth every 8 (eight) hours as needed. Patient taking differently: Take 1 tablet by mouth 2 (two) times daily as needed for moderate pain.  05/10/16   Orson Eva, MD  pantoprazole (PROTONIX) 40 MG tablet Take 1 tablet (40 mg total) by mouth daily before breakfast. 10/30/14   Adrian Prows, MD  pregabalin (LYRICA) 100 MG capsule Take 1 capsule (100 mg total) by mouth 3 (three) times daily. 05/10/16   Orson Eva, MD  Probiotic CAPS Take 2 capsules by mouth daily after breakfast.    [provider]  Propylene Glycol (SYSTANE  BALANCE OP) Place 2-3 drops into both eyes daily as needed (for dry eyes).    [provider]  rOPINIRole (REQUIP) 2 MG tablet Take 2 mg by mouth at bedtime.     [provider]  Tamsulosin HCl (FLOMAX) 0.4 MG CAPS Take 0.4 mg by mouth daily.    [provider]  torsemide (DEMADEX) 20 MG tablet Take 20 mg by mouth every morning. MAY TAKE AN EXTRA 20 MG ONCE A DAY  FOR EXCESSIVE FLUID RETENTION    [provider]    Physical Exam: Vitals:   02/14/18 0400 02/14/18 0415 02/14/18 0430 02/14/18 0445  BP: (!) 150/82 (!) 151/107 (!) 157/95 (!) 156/72  Pulse: 73 70 67 68  Resp: 16 20 17 20   Temp:      TempSrc:      SpO2: 99% 98% 100% 99%      Constitutional: NAD, calm  Eyes: PERTLA, lids and conjunctivae normal ENMT: Mucous membranes are moist. Posterior pharynx clear of any exudate or lesions.   Neck: normal, supple, no masses, no thyromegaly Respiratory: clear to auscultation bilaterally, no wheezing, no crackles. Normal respiratory effort.    Cardiovascular: S1 & S2 heard, regular rate and rhythm. No extremity edema.   Abdomen: No distension, no tenderness, soft. Colostomy bag with brown stool. Bowel sounds normal.  Musculoskeletal: no clubbing / cyanosis. Status-post right BKA.    Skin: no significant rashes, lesions, ulcers. Warm, dry, well-perfused. Neurologic: No facial asymmetry. Sensation intact. Moving all extremities.  Psychiatric: Alert and oriented to person, place, and situation. Calm and cooperative.     Labs on Admission: I have personally reviewed following labs and imaging studies  CBC: Recent Labs  Lab 02/14/18 0036  WBC 5.9  HGB 8.6*  HCT 26.4*  MCV 82.8  PLT 027   Basic Metabolic Panel: Recent Labs  Lab 02/14/18 0036  NA 138  K 4.9  CL 110  CO2 22  GLUCOSE 203*  BUN 25*  CREATININE 2.26*  CALCIUM 8.7*   GFR: CrCl cannot be calculated (Unknown ideal weight.). Liver Function Tests: No results for input(s): AST, ALT, ALKPHOS, BILITOT, PROT, ALBUMIN in the last 168 hours. No results for input(s): LIPASE, AMYLASE in the last 168 hours. No results for input(s): AMMONIA in the last 168 hours. Coagulation Profile: No results for input(s): INR, PROTIME in the last 168 hours. Cardiac Enzymes: No results for input(s): CKTOTAL, CKMB, CKMBINDEX, TROPONINI in the last 168 hours. BNP (last 3 results) No  results for input(s): PROBNP in the last 8760 hours. HbA1C: No results for input(s): HGBA1C in the last 72 hours. CBG: No results for input(s): GLUCAP in the last 168 hours. Lipid Profile: No results for input(s): CHOL, HDL, LDLCALC, TRIG, CHOLHDL, LDLDIRECT in the last 72 hours. Thyroid Function Tests: No results for input(s): TSH, T4TOTAL, FREET4, T3FREE, THYROIDAB in the last 72 hours. Anemia Panel: No results for input(s): VITAMINB12, FOLATE, FERRITIN, TIBC, IRON, RETICCTPCT in the last 72 hours. Urine analysis:    Component Value Date/Time   COLORURINE YELLOW 01/03/2018 2155   APPEARANCEUR CLEAR 01/03/2018 2155   LABSPEC 1.011 01/03/2018 2155   PHURINE 5.0 01/03/2018 2155   GLUCOSEU 50 (A) 01/03/2018 2155   HGBUR NEGATIVE 01/03/2018 2155   BILIRUBINUR NEGATIVE 01/03/2018 2155   KETONESUR NEGATIVE 01/03/2018 2155   PROTEINUR 100 (A) 01/03/2018 2155   UROBILINOGEN 0.2 01/14/2015 0346   NITRITE NEGATIVE 01/03/2018 2155   LEUKOCYTESUR NEGATIVE 01/03/2018 2155   Sepsis Labs: @LABRCNTIP (procalcitonin:4,lacticidven:4) )No results found  for this or any previous visit (from the past 240 hour(s)).   Radiological Exams on Admission: Dg Chest 2 View  Result Date: 02/14/2018 CLINICAL DATA:  Acute onset of generalized chest pain. Elevated D-dimer. Shortness of breath, nausea and dizziness. EXAM: CHEST - 2 VIEW COMPARISON:  Chest radiograph performed 01/18/2018 FINDINGS: The lungs are well-aerated. Mild peribronchial thickening is noted. Mild bibasilar opacities may reflect atelectasis or mild pneumonia. There is no evidence of pleural effusion or pneumothorax. The heart is normal in size; the mediastinal contour is within normal limits. No acute osseous abnormalities are seen. IMPRESSION: Mild peribronchial thickening. Mild bibasilar opacities may reflect atelectasis or mild pneumonia. Electronically Signed   By: Garald Balding M.D.   On: 02/14/2018 01:10    EKG: Independently reviewed.  Sinus rhythm, RBBB, LAFB. Similar to prior.   Assessment/Plan   1. Pleuritic chest pain; elevated d-dimer; CAD  - Presents with several days of pleuritic chest pain and mild SOB; was evaluated by PCP, found to have elevated d-dimer, and sent to ED  - He has hx of CAD, but sxs atypical coronary event, troponin negative, and EKG unchanged  - Plan for V/Q scan    2. Acute kidney injury superimposed on CKD stage III  - SCr is 2.26 on admission, up from 1.37 two weeks earlier  - Denies recent vomiting, diarrhea, or poor appetite  - Check renal US and urine chemistries  - Start gentle IVF hydration, renally-dose medications, and avoid nephrotoxins    3. Insulin-dependent DM  - A1c was 7.1% in April  - Managed at home with Lantus 60 units qHS and Humalog 15 units TID  - Check CBG's and start with Lantus 40 units qHS and Novolog correctional while in hospital    4. Chronic pain  - No pain complaints on admission  - Continue home-regimen with fentanyl patch, Lyrica, prn Percocet, and prn Dilaudid    5. Chronic diastolic CHF  - Appears euvolemic on admission  - Holding torsemide and ACE while providing gentle IVF hydration in light of AKI on CKD III  - Follow daily wt and I/O's     DVT prophylaxis: sq heparin  Code Status: Full  Family Communication: Discussed with patient   Consults called: None Admission status: Obsevation    [name], MD [facility] Pager [phone]  If 7PM-7AM, please contact night-coverage [email or web address] Password [facility]  02/14/2018, 5:33 AM

## 2018-02-14 NOTE — ED Provider Notes (Signed)
[facility] EMERGENCY DEPARTMENT Provider Note  CSN: [id number] Arrival date & time: 02/14/18 0018  Chief Complaint(s) Chest Pain  HPI [name] is a 62 y.o. male   The history is provided by the patient.  Chest Pain   Episode onset: 7 days. The problem occurs constantly. Progression since onset: fluctuating. The pain is present in the substernal region. The pain is moderate. The quality of the pain is described as dull and stabbing. The pain radiates to the upper back. The symptoms are aggravated by certain positions and deep breathing. Associated symptoms include back pain, lower extremity edema, malaise/fatigue, orthopnea and shortness of breath (intermittent and sporadic). Pertinent negatives include no cough, no exertional chest pressure, no fever, no hemoptysis, no nausea and no vomiting. He has tried rest for the symptoms. The treatment provided no relief. Risk factors include male gender.  His past medical history is significant for CAD (s/p stenting), CHF, diabetes, hyperlipidemia and hypertension.  Pertinent negatives for past medical history include no DVT, no MI and no PE.   Recent perforated diverticulitis requiring ostomy.   Past Medical History Past Medical History:  Diagnosis Date  . Barrett's esophagus   . Carotid artery occlusion 11/10/10   LEFT CAROTID ENDARTERECTOMY  . CHF (congestive heart failure) ([place])   . Complication of anesthesia    BP WENT UP AT [facility] "  . COPD (chronic obstructive pulmonary disease) ([place])   . Coronary artery disease   . Diabetes mellitus   . Diverticulitis   . Diverticulosis of colon (without mention of hemorrhage)   . DJD (degenerative joint disease)   . Fatty liver   . Full dentures   . GERD (gastroesophageal reflux disease)   . H/O Doppler ultrasound 06/28/2012   Lower venous duplex left-- findings consistent with acute deep vein thrombosis involving the personal vein of the left lower extremity. No evidence of  Baker's cyst on the left. Duplex scan of the left lower extremity arteries reveal adequate blood flow.   . H/O hiatal hernia   . History of cardiovascular stress test 03/23/2011   ejection fraction measured 51%, with an end-diastolic volume of 314 ml and an end-systolic volume of 81 ml. no evidence of myocardial ischemia.  Marland Kitchen History of echocardiogram 08/22/2011   Mildly hypertrophic left ventricle with normal systolic function and mild diastolic dysfunction. mildly dilated left atrium. no significant valvular abnormalities. no pericardial effusion no change from previous study 10/2010. the ejection fraction =>55%  . History of Holter monitoring 02/14/2012   Scan quality is fair due to baseline artifact. Predominant rhythm is Sinus. Veentricular Ectopy was noted in single beat form. The total recording time was 24:00 hours. total beats =[id number]. the total time analyzed was 23:58 hours. Heart  Max = 102 at 1:18:22, Min= 52 at 9:56:19 am., Avg= 70. There were 0 pause(s) > 2.5 seconds. the longest pause = 0.00 sec. at 1:51 pm(1)  . Hyperlipidemia   . Hypertension   . Neuromuscular disorder ([place])    peripheral neuropathy  . OSA (obstructive sleep apnea) 12/26/2008   took himself off CPAP; AHI of 6.3/hr overall, 32.7/hr during REM sleep and an RDI of 24.8/hr and 50.9/hr.  . Osteomyelitis ([place])    left 5th metatarsal  . PAD (peripheral artery disease) ([place])    Distal aortogram June 2012. Atherectomy left popliteal artery July 2012.   Marland Kitchen Slurred speech    AS PER WIFE IN D/C NOTE 11/10/10  . Substernal chest pain   .  Visual color changes    LEFT EYE; BUT NOT TOTAL BLINDNESS  . Wears glasses    Patient Active Problem List   Diagnosis Date Noted  . Diverticulitis of large intestine with perforation and abscess 01/13/2018  . Intra-abdominal abscess ([place]) 11/04/2017  . Diverticulitis 11/04/2017  . Chronic venous hypertension (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 ([place]) 12/11/2016    . Non-pressure chronic ulcer of other part of left foot limited to breakdown of skin ([place]) 11/12/2016  . Unilateral primary osteoarthritis, left knee 10/11/2016  . Midfoot ulcer, left, limited to breakdown of skin ([place]) 10/11/2016  . Hypertension 07/30/2016  . History of right below knee amputation ([place]) 07/12/2016  . Poorly controlled type 2 diabetes mellitus with peripheral neuropathy ([place]) 05/09/2016  . Pressure ulcer 05/08/2016  . Diabetic foot infection ([place]) 05/04/2016  . Diabetic foot ([place]) 05/04/2016  . Type 2 diabetes mellitus with insulin therapy ([place]) 12/16/2015  . Amputated toe ([place]) 10/21/2015  . Diabetic foot ulcer ([place]) 10/12/2015  . Leg edema, left 09/03/2015  . Acute renal failure ([place]) 09/03/2015  . CAD (dz of distal, mid and proximal RCA with implantation of 3 overlapping drug-eluting stent,) 09/03/2015  . Chronic diastolic heart failure, NYHA class 2 ([place]) 09/03/2015  . Opacity of lung on imaging study 01/14/2015  . Angina pectoris associated with type 2 diabetes mellitus ([place]) 10/28/2014  . Hyperlipidemia 08/25/2014  . Chronic total occlusion of artery of the extremities ([place]) 04/08/2012  . Onychomycosis 02/01/2012  . Diabetes mellitus, insulin dependent (IDDM), controlled ([place]) 11/01/2011  . Occlusion and stenosis of carotid artery without mention of cerebral infarction 06/12/2011  . Aftercare following surgery of the circulatory system, [place] 06/12/2011  . GERD (gastroesophageal reflux disease) 05/08/2011  . Barrett's esophagus without dysplasia 05/08/2011  . PVD (peripheral vascular disease) ([place]) 02/01/2011  . HTN (hypertension) 02/01/2011  . Former tobacco use 02/01/2011   Home Medication(s) Prior to Admission medications   Medication Sig Start Date End Date Taking? Authorizing Provider  aspirin EC 81 MG tablet Take 81 mg by mouth daily.    [provider]  atorvastatin (LIPITOR) 40 MG tablet Take 40 mg by mouth daily.    [provider]   carvedilol (COREG) 25 MG tablet Take 1.5 tablets (37.5 mg total) by mouth 2 (two) times daily. 08/01/16   Adrian Prows, MD  clobetasol (TEMOVATE) 0.05 % external solution Apply 1 application topically daily as needed (dry scalp).    [provider]  clopidogrel (PLAVIX) 75 MG tablet Take 75 mg by mouth daily.  03/28/12   [provider]  clotrimazole-betamethasone (LOTRISONE) cream Apply 1 application topically daily as needed (dry skin - beard).     [provider]  collagenase (SANTYL) ointment Apply topically daily. Patient taking differently: Apply 1 application topically daily. TO AFFECTED SITE(S) 09/06/15   Lavina Hamman, MD  fentaNYL (DURAGESIC - DOSED MCG/HR) 50 MCG/HR Place 1 patch (50 mcg total) onto the skin every 3 (three) days. 05/10/16   Orson Eva, MD  fluticasone (CUTIVATE) 0.05 % cream Apply 1 application topically 2 (two) times daily as needed (dry skin on face).     [provider]  HUMALOG KWIKPEN 100 UNIT/ML KiwkPen Inject 15 Units into the skin 3 (three) times daily with meals. 15 units with breakfast, lunch, & dinner-- pt may increase dose by 1 unit if sugar is elevated 11/14/17   [provider]  HYDROmorphone (DILAUDID) 2 MG tablet Take 1 tablet (2 mg  total) by mouth at bedtime. Patient taking differently: Take 2 mg by mouth at bedtime as needed (for breakthrough pain).  05/10/16   Orson Eva, MD  Insulin Glargine (BASAGLAR KWIKPEN) 100 UNIT/ML SOPN Inject 60 Units into the skin at bedtime. 12/17/17   [provider]  isosorbide mononitrate (IMDUR) 120 MG 24 hr tablet Take 120 mg by mouth daily.    [provider]  lisinopril (PRINIVIL,ZESTRIL) 20 MG tablet Take 20 mg by mouth daily. 12/17/17   [provider]  metFORMIN (GLUCOPHAGE) 1000 MG tablet Take 1,000 mg by mouth 2 (two) times daily. 12/18/17   [provider]  metoCLOPramide (REGLAN) 10 MG tablet Take 1 tablet (10 mg total) by mouth 3 (three)  times daily with meals. 01/24/18 01/24/19  Clovis Riley, MD  Multiple Vitamin (MULTIVITAMIN WITH MINERALS) TABS tablet Take 1 tablet by mouth daily.    [provider]  nitroGLYCERIN (NITRO-BID) 2 % ointment Apply 0.75 inches topically daily as needed for chest pain.     [provider]  nitroGLYCERIN (NITRODUR - DOSED IN MG/24 HR) 0.2 mg/hr patch Place 1 patch (0.2 mg total) onto the skin daily. Patient taking differently: Place 0.2 mg onto the skin daily as needed (chest pain).  01/08/17   Suzan Slick, NP  nitroGLYCERIN (NITROSTAT) 0.4 MG SL tablet Place 1 tablet (0.4 mg total) under the tongue every 5 (five) minutes as needed (as needed for esophageal spasm). Patient taking differently: Place 0.4 mg under the tongue every 5 (five) minutes as needed for chest pain.  08/09/14   Pyrtle, Lajuan Lines, MD  omega-3 acid ethyl esters (LOVAZA) 1 G capsule Take 2 g by mouth 2 (two) times daily.    [provider]  ondansetron (ZOFRAN-ODT) 4 MG disintegrating tablet Take 4 mg by mouth 4 (four) times daily as needed for nausea.    [provider]  oxyCODONE-acetaminophen (PERCOCET/ROXICET) 5-325 MG tablet Take 1 tablet by mouth every 8 (eight) hours as needed. Patient taking differently: Take 1 tablet by mouth 2 (two) times daily as needed for moderate pain.  05/10/16   Orson Eva, MD  pantoprazole (PROTONIX) 40 MG tablet Take 1 tablet (40 mg total) by mouth daily before breakfast. 10/30/14   Adrian Prows, MD  pregabalin (LYRICA) 100 MG capsule Take 1 capsule (100 mg total) by mouth 3 (three) times daily. 05/10/16   Orson Eva, MD  Probiotic CAPS Take 2 capsules by mouth daily after breakfast.    [provider]  Propylene Glycol (SYSTANE BALANCE OP) Place 2-3 drops into both eyes daily as needed (for dry eyes).    [provider]  rOPINIRole (REQUIP) 2 MG tablet Take 2 mg by mouth at bedtime.     [provider]  Tamsulosin HCl (FLOMAX) 0.4 MG CAPS Take  0.4 mg by mouth daily.    [provider]  torsemide (DEMADEX) 20 MG tablet Take 20 mg by mouth every morning. MAY TAKE AN EXTRA 20 MG ONCE A DAY FOR EXCESSIVE FLUID RETENTION    [provider]  Past Surgical History Past Surgical History:  Procedure Laterality Date  . AMPUTATION  11/05/2011   Procedure: AMPUTATION RAY;  Surgeon: Wylene Simmer, MD;  Location: [facility] [place];  Service: Orthopedics;  Laterality: Right;  Amputation of Right 4&5th Toes  . AMPUTATION Left 11/26/2012   Procedure: AMPUTATION RAY;  Surgeon: Wylene Simmer, MD;  Location: [place];  Service: Orthopedics;  Laterality: Left;  fourth ray amputation  . AMPUTATION Right 08/27/2014   Procedure: Transmetatarsal Amputation;  Surgeon: Newt Minion, MD;  Location: [facility];  Service: Orthopedics;  Laterality: Right;  . AMPUTATION Right 01/14/2015   Procedure: AMPUTATION BELOW KNEE;  Surgeon: Newt Minion, MD;  Location: [place];  Service: Orthopedics;  Laterality: Right;  . AMPUTATION Left 10/21/2015   Procedure: Left Foot 5th Ray Amputation;  Surgeon: Newt Minion, MD;  Location: [facility];  Service: Orthopedics;  Laterality: Left;  . ANTERIOR FUSION CERVICAL SPINE  02/06/06   C4-5, C5-6, C6-7; SURGEON DR. MAX COHEN  . CARDIAC CATHETERIZATION  10/31/04   2009  . CAROTID ENDARTERECTOMY  11/10/10  . CAROTID ENDARTERECTOMY Left 11/10/2010   Subtotal occlusion of left internal carotid artery with left hemispheric transient ischemic attacks.  . CARPAL TUNNEL RELEASE Right 10/21/2013   Procedure: RIGHT CARPAL TUNNEL RELEASE;  Surgeon: Wynonia Sours, MD;  Location: [place];  Service: Orthopedics;  Laterality: Right;  . CHOLECYSTECTOMY    . COLONOSCOPY    . CYSTOSCOPY WITH STENT PLACEMENT Bilateral 01/13/2018   Procedure: CYSTOSCOPY WITH BILATERAL URETERAL CATHETER PLACEMENT;  Surgeon: Ardis Hughs, MD;  Location: [facility];  Service: Urology;  Laterality: Bilateral;  . ESOPHAGEAL MANOMETRY Bilateral 07/19/2014   Procedure: ESOPHAGEAL MANOMETRY (EM);  Surgeon: Jerene Bears, MD;  [facility];  Service: Gastroenterology;  Laterality: Bilateral;  . FEMORAL ARTERY STENT     x6  . FOOT SURGERY  04/25/2016    EXCISION BASE 5TH METATARSAL AND PARTIAL CUBOID LEFT FOOT  . HERNIA REPAIR     LEFT INGUINAL AND UMBILICAL REPAIRS  . I&D EXTREMITY Left 04/25/2016   Procedure: EXCISION BASE 5TH METATARSAL AND PARTIAL CUBOID LEFT FOOT;  Surgeon: Newt Minion, MD;  Location[place];  Service: Orthopedics;  Laterality: Left;  . ILEOSTOMY  01/13/2018   Procedure: ILEOSTOMY;  Surgeon: Clovis Riley, MD;  Location: [facility];  Service: General;;  . IR RADIOLOGIST EVAL & MGMT  11/19/2017  . IR RADIOLOGIST EVAL & MGMT  12/03/2017  . IR RADIOLOGIST EVAL & MGMT  12/18/2017  . JOINT REPLACEMENT Right 2001   Total  . LAMINECTOMY     X 3 LUMBAR AND X 2 CERVICAL SPINE OPERATIONS  . LAPAROSCOPIC CHOLECYSTECTOMY W/ CHOLANGIOGRAPHY  11/09/04   SURGEON DR. Luella Cook  . LEFT HEART CATHETERIZATION WITH CORONARY ANGIOGRAM N/A 10/29/2014   Procedure: LEFT HEART CATHETERIZATION WITH CORONARY ANGIOGRAM;  Surgeon: Laverda Page, MD;  Location: [facility] CATH LAB;  Service: Cardiovascular;  Laterality: N/A;  . LOWER EXTREMITY ANGIOGRAM N/A 03/19/2012   Procedure: LOWER EXTREMITY ANGIOGRAM;  Surgeon: Burnell Blanks, MD;  Location: [facility] LAB;  Service: Cardiovascular;  Laterality: N/A;  . NECK SURGERY    . PARTIAL COLECTOMY N/A 01/13/2018   Procedure: LAPAROSCOPIC ASSISTED   SIGMOID COLECTOMY ILEOSTOMY;  Surgeon: Clovis Riley, MD;  Location: [facility];  Service: General;  Laterality: N/A;  . PENILE PROSTHESIS IMPLANT  08/14/05   INFRAPUBIC INSERTION OF INFLATABLE PENILE PROSTHESIS; SURGEON DR. Amalia Hailey  . PERCUTANEOUS CORONARY STENT  INTERVENTION (PCI-S) Right 10/29/2014   Procedure: PERCUTANEOUS CORONARY STENT  INTERVENTION (PCI-S);  Surgeon: Laverda Page, MD;  Location: [facility] CATH LAB;  Service: Cardiovascular;  Laterality: Right;  . SHOULDER ARTHROSCOPY    . SPINE SURGERY    . TONSILLECTOMY    . TOTAL KNEE ARTHROPLASTY  07/2002   RIGHT KNEE ; SURGEON  DR. [name] ALSO HAD ARTHROSCOPIC RIGHT KNEE IN  10/2001  . ULNAR NERVE TRANSPOSITION Right 10/21/2013   Procedure: RIGHT ELBOW  ULNAR NERVE DECOMPRESSION;  Surgeon: Wynonia Sours, MD;  Location: [place];  Service: Orthopedics;  Laterality: Right;   Family History Family History  Problem Relation Age of Onset  . Heart disease Father        Before age 64-  CAD, BPG  . Diabetes Father        Amputation  . Cancer Father        PROSTATE  . Hyperlipidemia Father   . Hypertension Father   . Heart attack Father        Triple BPG  . Varicose Veins Father   . Colon cancer Brother   . Cancer Brother        Colon  . Diabetes Brother   . Heart disease Brother 55       A-Fib. Before age 71  . Hyperlipidemia Brother   . Hypertension Brother   . Cancer Sister        Breast  . Hyperlipidemia Sister   . Hypertension Sister   . Hypertension Son   . Arthritis Unknown        GRANDMOTHER  . Hypertension Unknown        OTHER FAMILY MEMBERS    Social History Social History   Tobacco Use  . Smoking status: Current Every Day Smoker    Packs/day: 0.50    Years: 35.00    Pack years: 17.50    Types: E-cigarettes    Last attempt to quit: 10/28/2011    Years since quitting: 6.3  . Smokeless tobacco: Never Used  . Tobacco comment: VAPOR CIGARETTES (Water only)  Substance Use Topics  . Alcohol use: No    Alcohol/week: 0.0 oz    Comment: "not in a long time"  . Drug use: No   Allergies Ivp dye [iodinated diagnostic agents] and Adhesive [tape]  Review of Systems Review of Systems  Constitutional: Positive for malaise/fatigue. Negative for fever.  Respiratory: Positive for shortness of breath (intermittent and sporadic).  Negative for cough and hemoptysis.   Cardiovascular: Positive for chest pain and orthopnea.  Gastrointestinal: Negative for nausea and vomiting.  Musculoskeletal: Positive for back pain.   All other systems are reviewed and are negative for acute change except as noted in the HPI  Physical Exam Vital Signs  I have reviewed the triage vital signs BP (!) 168/73   Pulse 85   Temp 98.6 F (37 C) (Oral)   Resp 18   SpO2 100%   Physical Exam  Constitutional: He is oriented to person, place, and time. He appears well-developed and well-nourished. No distress.  HENT:  Head: Normocephalic and atraumatic.  Nose: Nose normal.  Eyes: Pupils are equal, round, and reactive to light. Conjunctivae and EOM are normal. Right eye exhibits no discharge. Left eye exhibits no discharge. No scleral icterus.  Neck: Normal range of motion. Neck supple.  Cardiovascular: Normal rate and regular rhythm. Exam reveals no gallop and no friction rub.  No murmur heard. Pulmonary/Chest: Effort normal and breath  sounds normal. No stridor. No respiratory distress. He has no rales.  Abdominal: Soft. He exhibits no distension. There is no tenderness.  Musculoskeletal: He exhibits no edema or tenderness.       Legs: 2 + LLE edema  Neurological: He is alert and oriented to person, place, and time.  Skin: Skin is warm and dry. No rash noted. He is not diaphoretic. No erythema.  Psychiatric: He has a normal mood and affect.  Vitals reviewed.   ED Results and Treatments Labs (all labs ordered are listed, but only abnormal results are displayed) Labs Reviewed  BASIC METABOLIC PANEL - Abnormal; Notable for the following components:      Result Value   Glucose, Bld 203 (*)    BUN 25 (*)    Creatinine, Ser 2.26 (*)    Calcium 8.7 (*)    GFR calc non Af Amer 29 (*)    GFR calc Af Amer 34 (*)    All other components within normal limits  CBC - Abnormal; Notable for the following components:   RBC 3.19 (*)     Hemoglobin 8.6 (*)    HCT 26.4 (*)    All other components within normal limits  D-DIMER, QUANTITATIVE (NOT AT [facility]) - Abnormal; Notable for the following components:   D-Dimer, Quant 1.17 (*)    All other components within normal limits  CBG MONITORING, ED - Abnormal; Notable for the following components:   Glucose-Capillary 108 (*)    All other components within normal limits  BRAIN NATRIURETIC PEPTIDE  TROPONIN I  TROPONIN I  TROPONIN I  SODIUM, URINE, RANDOM  UREA NITROGEN, URINE  CREATININE, URINE, RANDOM  I-STAT TROPONIN, ED                                                                                                                         EKG  EKG Interpretation  Date/Time:  Friday February 14 2018 00:31:50 EDT Ventricular Rate:  81 PR Interval:  196 QRS Duration: 126 QT Interval:  410 QTC Calculation: 476 R Axis:   -57 Text Interpretation:  Normal sinus rhythm Right bundle branch block Left anterior fascicular block ** Bifascicular block ** Septal infarct , age undetermined Abnormal ECG Artifact No significant change since last tracing Confirmed by Addison Lank [id number]) on 02/14/2018 1:36:58 AM      Radiology Dg Chest 2 View  Result Date: 02/14/2018 CLINICAL DATA:  Acute onset of generalized chest pain. Elevated D-dimer. Shortness of breath, nausea and dizziness. EXAM: CHEST - 2 VIEW COMPARISON:  Chest radiograph performed 01/18/2018 FINDINGS: The lungs are well-aerated. Mild peribronchial thickening is noted. Mild bibasilar opacities may reflect atelectasis or mild pneumonia. There is no evidence of pleural effusion or pneumothorax. The heart is normal in size; the mediastinal contour is within normal limits. No acute osseous abnormalities are seen. IMPRESSION: Mild peribronchial thickening. Mild bibasilar opacities may reflect atelectasis or mild pneumonia. Electronically Signed   By: Francoise Schaumann.D.  On: 02/14/2018 01:10   Pertinent labs & imaging results that were  available during my care of the patient were reviewed by me and considered in my medical decision making (see chart for details).  Medications Ordered in ED Medications - No data to display                                                                                                                                  Procedures Procedures  (including critical care time)  Medical Decision Making / ED Course I have reviewed the nursing notes for this encounter and the patient's prior records (if available in EHR or on provided paperwork).    Atypical chest pain for ACS.  EKG without acute ischemic changes or evidence of pericarditis.  Initial troponin negative.  Given the duration of patient's pain, single troponin is sufficient to rule out ACS.  Patient has evidence of volume overload on exam but BNP was within normal limits.  Chest x-ray without evidence of pulmonary edema, but did note peribronchial thickening. No evidence suggestive of pneumonia, pneumothorax, pneumomediastinum.  No abnormal contour of the mediastinum to suggest dissection. No evidence of acute injuries.  CBC with normocytic anemia; stable compared to prior.  D-dimer was repeated and elevated.  Labs revealed AKI likely due to overdiuresis or medication side effect.  Patient is still voiding, doubt post renal obstruction.   Due to AKI and inability to obtain a CTA, patient will be admitted for continued work-up and management of the AKI and to obtain a VQ scan to rule out a PE.     Final Clinical Impression(s) / ED Diagnoses Final diagnoses:  Atypical chest pain  AKI (acute kidney injury) ([place])      This chart was dictated using voice recognition software.  Despite best efforts to proofread,  errors can occur which can change the documentation meaning.   Fatima Blank, MD 02/14/18 [phone]

## 2018-02-14 NOTE — ED Notes (Signed)
Pt able to stand beside of bed to urinate, tolerated well with one assist.

## 2018-02-14 NOTE — ED Notes (Signed)
Pt bk frm [place]

## 2018-02-15 DIAGNOSIS — R0789 Other chest pain: Secondary | ICD-10-CM | POA: Diagnosis not present

## 2018-02-15 DIAGNOSIS — N179 Acute kidney failure, unspecified: Secondary | ICD-10-CM

## 2018-02-15 DIAGNOSIS — I2510 Atherosclerotic heart disease of native coronary artery without angina pectoris: Secondary | ICD-10-CM | POA: Diagnosis not present

## 2018-02-15 LAB — UREA NITROGEN, URINE: Urea Nitrogen, Ur: 286 mg/dL

## 2018-02-15 LAB — CBC
HCT: 22.6 % — ABNORMAL LOW (ref 39.0–52.0)
Hemoglobin: 7.5 g/dL — ABNORMAL LOW (ref 13.0–17.0)
MCH: 27.3 pg (ref 26.0–34.0)
MCHC: 33.2 g/dL (ref 30.0–36.0)
MCV: 82.2 fL (ref 78.0–100.0)
Platelets: 153 10*3/uL (ref 150–400)
RBC: 2.75 MIL/uL — ABNORMAL LOW (ref 4.22–5.81)
RDW: 14.1 % (ref 11.5–15.5)
WBC: 4.2 10*3/uL (ref 4.0–10.5)

## 2018-02-15 LAB — BASIC METABOLIC PANEL
Anion gap: 5 (ref 5–15)
BUN: 19 mg/dL (ref 6–20)
CO2: 24 mmol/L (ref 22–32)
Calcium: 8.4 mg/dL — ABNORMAL LOW (ref 8.9–10.3)
Chloride: 113 mmol/L — ABNORMAL HIGH (ref 101–111)
Creatinine, Ser: 1.5 mg/dL — ABNORMAL HIGH (ref 0.61–1.24)
GFR calc Af Amer: 55 mL/min — ABNORMAL LOW (ref >60)
GFR calc non Af Amer: 48 mL/min — ABNORMAL LOW (ref >60)
Glucose, Bld: 115 mg/dL — ABNORMAL HIGH (ref 65–99)
Potassium: 4.3 mmol/L (ref 3.5–5.1)
Sodium: 142 mmol/L (ref 135–145)

## 2018-02-15 LAB — IRON AND TIBC
Iron: 60 ug/dL (ref 45–182)
Saturation Ratios: 22 % (ref 17.9–39.5)
TIBC: 273 ug/dL (ref 250–450)
UIBC: 213 ug/dL

## 2018-02-15 LAB — HEMOGLOBIN AND HEMATOCRIT, BLOOD
HCT: 26 % — ABNORMAL LOW (ref 39.0–52.0)
Hemoglobin: 8.5 g/dL — ABNORMAL LOW (ref 13.0–17.0)

## 2018-02-15 LAB — FERRITIN: Ferritin: 118 ng/mL (ref 24–336)

## 2018-02-15 LAB — RETICULOCYTES
RBC.: 3.14 MIL/uL — ABNORMAL LOW (ref 4.22–5.81)
Retic Count, Absolute: 44 10*3/uL (ref 19.0–186.0)
Retic Ct Pct: 1.4 % (ref 0.4–3.1)

## 2018-02-15 LAB — GLUCOSE, CAPILLARY
Glucose-Capillary: 149 mg/dL — ABNORMAL HIGH (ref 65–99)
Glucose-Capillary: 125 mg/dL — ABNORMAL HIGH (ref 65–99)
Glucose-Capillary: 148 mg/dL — ABNORMAL HIGH (ref 65–99)
Glucose-Capillary: 163 mg/dL — ABNORMAL HIGH (ref 65–99)

## 2018-02-15 LAB — VITAMIN B12: Vitamin B-12: 194 pg/mL (ref 180–914)

## 2018-02-15 LAB — FOLATE: Folate: 7.4 ng/mL (ref >5.9)

## 2018-02-15 LAB — PREPARE RBC (CROSSMATCH)

## 2018-02-15 MED ORDER — TORSEMIDE 20 MG PO TABS: 20.0000 mg | ORAL_TABLET | Freq: Every morning | ORAL | Status: DC

## 2018-02-15 MED ORDER — CYANOCOBALAMIN 1000 MCG/ML IJ SOLN
1000.0000 ug | Freq: Once | INTRAMUSCULAR | Status: AC
  Administered 2018-02-15: 1000 ug via INTRAMUSCULAR
  Filled 2018-02-15: qty 1

## 2018-02-15 MED ORDER — FUROSEMIDE 10 MG/ML IJ SOLN
40.0000 mg | Freq: Once | INTRAMUSCULAR | Status: AC
  Administered 2018-02-15: 40 mg via INTRAVENOUS
  Filled 2018-02-15: qty 4

## 2018-02-15 MED ORDER — "NEEDLE (REUSABLE) 22G X 1-1/2"" MISC": 1.0000 [IU] | 0 refills | Status: DC

## 2018-02-15 MED ORDER — FE FUMARATE-B12-VIT C-FA-IFC PO CAPS
1.0000 | ORAL_CAPSULE | Freq: Three times a day (TID) | ORAL | Status: DC
  Administered 2018-02-15: 1 via ORAL
  Filled 2018-02-15: qty 1

## 2018-02-15 MED ORDER — CYANOCOBALAMIN 1000 MCG/ML IJ SOLN: 1000.0000 ug | INTRAMUSCULAR | 1 refills | Status: DC

## 2018-02-15 MED ORDER — FERROUS SULFATE 325 (65 FE) MG PO TBEC: 325.0000 mg | DELAYED_RELEASE_TABLET | Freq: Two times a day (BID) | ORAL | 3 refills | Status: DC

## 2018-02-15 MED ORDER — SODIUM CHLORIDE 0.9 % IV SOLN
Freq: Once | INTRAVENOUS | Status: AC
  Administered 2018-02-15: 13:00:00 via INTRAVENOUS

## 2018-02-15 MED ORDER — "SYRINGE 22G X 1-1/2"" 3 ML MISC": 1.0000 | 0 refills | Status: DC

## 2018-02-15 MED ORDER — FOLIC ACID 1 MG PO TABS: 1.0000 mg | ORAL_TABLET | Freq: Every day | ORAL | 1 refills | Status: DC

## 2018-02-15 NOTE — Care Management Note (Signed)
Case Management Note  Patient Details  Name: [name] MRN: [id number] Date of Birth: Jul 04, 1956  Subjective/Objective:  Pt presented for Chest Pain- Previous admission for diverticulitis with perforation status post sigmoid colectomy. Pt was active with [facility] with [place] RN, PT services-No resumption orders needed (Observation Status) to transition home.                 Action/Plan: CM did call [place] for [facility]- aware that pt is hospitalized and will need services once stable to transition home. No further needs from CM at this time.   Expected Discharge Date:                  Expected Discharge Plan:  [place]  In-House Referral:  NA  Discharge planning Services  CM Consult  Post Acute Care Choice:  Home Health, Resumption of Svcs/PTA Provider Choice offered to:  Patient  DME Arranged:  N/A DME Agency:  NA  HH Arranged:  RN, PT [place] Agency:   [facility]  Status of Service:  Completed, signed off  If discussed at [employer], dates discussed:    Additional Comments:  Bethena Roys, RN 02/15/2018, 3:26 PM

## 2018-02-15 NOTE — Progress Notes (Signed)
The patient has been given his discharge instructions along with a new medication list and what to take today. He has been educated on how to inject an IM injection and draw the medication up for injection. He has paper prescriptions to pick up and follow up appointments to make. He is discharging with his wife via car.   Saddie Benders RN

## 2018-02-15 NOTE — Discharge Summary (Signed)
[name], is a 62 y.o. male  DOB [birth year]  MRN [id number].  Admission date:  02/14/2018  Admitting Physician  [name], MD  Discharge Date:  02/15/2018   Primary MD  Isaias Sakai, DO  Recommendations for primary care physician for things to follow:  -Check CBC, BMP during next visit   Admission Diagnosis  Atypical chest pain [R07.89] AKI (acute kidney injury) ([place]) [N17.9] Acute renal failure superimposed on stage 3 chronic kidney disease ([place]) [N17.9, N18.3]   Discharge Diagnosis  Atypical chest pain [R07.89] AKI (acute kidney injury) ([place]) [N17.9] Acute renal failure superimposed on stage 3 chronic kidney disease ([place]) [N17.9, N18.3]    Principal Problem:   Chest pain Active Problems:   CAD (dz of distal, mid and proximal RCA with implantation of 3 overlapping drug-eluting stent,)   Chronic diastolic heart failure, NYHA class 2 ([place])   Type 2 diabetes mellitus with insulin therapy ([place])   Hypertension   Acute renal failure superimposed on stage 3 chronic kidney disease (H[facility])   Elevated d-dimer   Pleuritic chest pain   Chronic pain   Normocytic anemia      Past Medical History:  Diagnosis Date  . Barrett's esophagus   . Carotid artery occlusion 11/10/10   LEFT CAROTID ENDARTERECTOMY  . CHF (congestive heart failure) ([place])   . Complication of anesthesia    BP WENT UP AT [facility] "  . COPD (chronic obstructive pulmonary disease) ([place])   . Coronary artery disease   . Diabetes mellitus   . Diverticulitis   . Diverticulosis of colon (without mention of hemorrhage)   . DJD (degenerative joint disease)   . Fatty liver   . Full dentures   . GERD (gastroesophageal reflux disease)   . H/O Doppler ultrasound 06/28/2012   Lower venous duplex left-- findings consistent with acute deep vein thrombosis involving the personal vein of the left lower extremity. No evidence of  Baker's cyst on the left. Duplex scan of the left lower extremity arteries reveal adequate blood flow.   . H/O hiatal hernia   . History of cardiovascular stress test 03/23/2011   ejection fraction measured 51%, with an end-diastolic volume of 078 ml and an end-systolic volume of 81 ml. no evidence of myocardial ischemia.  Marland Kitchen History of echocardiogram 08/22/2011   Mildly hypertrophic left ventricle with normal systolic function and mild diastolic dysfunction. mildly dilated left atrium. no significant valvular abnormalities. no pericardial effusion no change from previous study 10/2010. the ejection fraction =>55%  . History of Holter monitoring 02/14/2012   Scan quality is fair due to baseline artifact. Predominant rhythm is Sinus. Veentricular Ectopy was noted in single beat form. The total recording time was 24:00 hours. total beats =[id number]. the total time analyzed was 23:58 hours. Heart  Max = 102 at 1:18:22, Min= 52 at 9:56:19 am., Avg= 70. There were 0 pause(s) > 2.5 seconds. the longest pause = 0.00 sec. at 1:51 pm(1)  . Hyperlipidemia   . Hypertension   .  Neuromuscular disorder ([place])    peripheral neuropathy  . OSA (obstructive sleep apnea) 12/26/2008   took himself off CPAP; AHI of 6.3/hr overall, 32.7/hr during REM sleep and an RDI of 24.8/hr and 50.9/hr.  . Osteomyelitis ([place])    left 5th metatarsal  . PAD (peripheral artery disease) ([place])    Distal aortogram June 2012. Atherectomy left popliteal artery July 2012.   Marland Kitchen Slurred speech    AS PER WIFE IN D/C NOTE 11/10/10  . Substernal chest pain   . Visual color changes    LEFT EYE; BUT NOT TOTAL BLINDNESS  . Wears glasses     Past Surgical History:  Procedure Laterality Date  . AMPUTATION  11/05/2011   Procedure: AMPUTATION RAY;  Surgeon: Wylene Simmer, MD;  Location: [facility];  Service: Orthopedics;  Laterality: Right;  Amputation of Right 4&5th Toes  . AMPUTATION Left 11/26/2012   Procedure: AMPUTATION RAY;  Surgeon: Wylene Simmer, MD;   Location: [facility];  Service: Orthopedics;  Laterality: Left;  fourth ray amputation  . AMPUTATION Right 08/27/2014   Procedure: Transmetatarsal Amputation;  Surgeon: Newt Minion, MD;  Location: [facility];  Service: Orthopedics;  Laterality: Right;  . AMPUTATION Right 01/14/2015   Procedure: AMPUTATION BELOW KNEE;  Surgeon: Newt Minion, MD;  Location: [facility];  Service: Orthopedics;  Laterality: Right;  . AMPUTATION Left 10/21/2015   Procedure: Left Foot 5th Ray Amputation;  Surgeon: Newt Minion, MD;  Location: [facility];  Service: Orthopedics;  Laterality: Left;  . ANTERIOR FUSION CERVICAL SPINE  02/06/06   C4-5, C5-6, C6-7; SURGEON DR. MAX COHEN  . CARDIAC CATHETERIZATION  10/31/04   2009  . CAROTID ENDARTERECTOMY  11/10/10  . CAROTID ENDARTERECTOMY Left 11/10/2010   Subtotal occlusion of left internal carotid artery with left hemispheric transient ischemic attacks.  . CARPAL TUNNEL RELEASE Right 10/21/2013   Procedure: RIGHT CARPAL TUNNEL RELEASE;  Surgeon: Wynonia Sours, MD;  Location: [facility];  Service: Orthopedics;  Laterality: Right;  . CHOLECYSTECTOMY    . COLONOSCOPY    . CYSTOSCOPY WITH STENT PLACEMENT Bilateral 01/13/2018   Procedure: CYSTOSCOPY WITH BILATERAL URETERAL CATHETER PLACEMENT;  Surgeon: Ardis Hughs, MD;  Location: [facility];  Service: Urology;  Laterality: Bilateral;  . ESOPHAGEAL MANOMETRY Bilateral 07/19/2014   Procedure: ESOPHAGEAL MANOMETRY (EM);  Surgeon: Jerene Bears, MD;  [facility];  Service: Gastroenterology;  Laterality: Bilateral;  . FEMORAL ARTERY STENT     x6  . FOOT SURGERY  04/25/2016    EXCISION BASE 5TH METATARSAL AND PARTIAL CUBOID LEFT FOOT  . HERNIA REPAIR     LEFT INGUINAL AND UMBILICAL REPAIRS  . I&D EXTREMITY Left 04/25/2016   Procedure: EXCISION BASE 5TH METATARSAL AND PARTIAL CUBOID LEFT FOOT;  Surgeon: Newt Minion, MD;  Location: [facility];  Service: Orthopedics;  Laterality: Left;  . ILEOSTOMY  01/13/2018   Procedure:  ILEOSTOMY;  Surgeon: Clovis Riley, MD;  Location: [facility];  Service: General;;  . IR RADIOLOGIST EVAL & MGMT  11/19/2017  . IR RADIOLOGIST EVAL & MGMT  12/03/2017  . IR RADIOLOGIST EVAL & MGMT  12/18/2017  . JOINT REPLACEMENT Right 2001   Total  . LAMINECTOMY     X 3 LUMBAR AND X 2 CERVICAL SPINE OPERATIONS  . LAPAROSCOPIC CHOLECYSTECTOMY W/ CHOLANGIOGRAPHY  11/09/04   SURGEON DR. Luella Cook  . LEFT HEART CATHETERIZATION WITH CORONARY ANGIOGRAM N/A 10/29/2014   Procedure: LEFT HEART CATHETERIZATION WITH CORONARY ANGIOGRAM;  Surgeon: Laverda Page, MD;  Location[facility] CATH LAB;  Service: Cardiovascular;  Laterality: N/A;  . LOWER EXTREMITY ANGIOGRAM N/A 03/19/2012   Procedure: LOWER EXTREMITY ANGIOGRAM;  Surgeon: Burnell Blanks, MD;  Location: [facility] CATH LAB;  Service: Cardiovascular;  Laterality: N/A;  . NECK SURGERY    . PARTIAL COLECTOMY N/A 01/13/2018   Procedure: LAPAROSCOPIC ASSISTED   SIGMOID COLECTOMY ILEOSTOMY;  Surgeon: Clovis Riley, MD;  Location: [facility];  Service: General;  Laterality: N/A;  . PENILE PROSTHESIS IMPLANT  08/14/05   INFRAPUBIC INSERTION OF INFLATABLE PENILE PROSTHESIS; SURGEON DR. Amalia Hailey  . PERCUTANEOUS CORONARY STENT INTERVENTION (PCI-S) Right 10/29/2014   Procedure: PERCUTANEOUS CORONARY STENT INTERVENTION (PCI-S);  Surgeon: Laverda Page, MD;  Location: [facility] CATH LAB;  Service: Cardiovascular;  Laterality: Right;  . SHOULDER ARTHROSCOPY    . SPINE SURGERY    . TONSILLECTOMY    . TOTAL KNEE ARTHROPLASTY  07/2002   RIGHT KNEE ; SURGEON  DR. [name] ALSO HAD ARTHROSCOPIC RIGHT KNEE IN  10/2001  . ULNAR NERVE TRANSPOSITION Right 10/21/2013   Procedure: RIGHT ELBOW  ULNAR NERVE DECOMPRESSION;  Surgeon: Wynonia Sours, MD;  Location: [facility];  Service: Orthopedics;  Laterality: Right;       History of present illness and  Hospital Course:     Kindly see H&P for history of present illness and admission details, please review complete  Labs, Consult reports and Test reports for all details in brief  HPI  from the history and physical done on the day of admission   HPI: IZAAK SAHR is a 62 y.o. male with medical history significant for hypertension, insulin-dependent diabetes mellitus, chronic diastolic CHF, coronary artery disease, foot ulcer status post right BKA, and recent diverticulitis with perforation status post sigmoid colectomy, now presenting to the emergency department for evaluation of chest pain.  The patient reports that developed discomfort in the left chest several days ago, worse with certain movements or deep inspiration.  He reports some mild dyspnea associated with this, but denies significant cough.  Denies fevers or chills.  He also denies leg swelling or tenderness.  He was evaluated for these complaints by his PCP and reports having an elevated d-dimer on outpatient blood work, for which she was directed to the ED.  ED Course: Upon arrival to the ED, patient is found to be afebrile, saturating well on room air, and with vitals otherwise normal.  EKG features a sinus rhythm with RBBB and LAFB, similar to prior.  Chest x-ray is notable for mild peribronchial thickening and mild bibasilar opacities suggestive of atelectasis versus pneumonia.  Chemistry panel is concerning for a creatinine of 2.26, up from 1.37 two weeks ago.  CBC features a stable normocytic anemia with hemoglobin of 8.6.  Troponin is normal and BNP is also normal.  Patient was treated with 324 mg of aspirin and analgesia in the ED.  He remains hemodynamically stable, in no acute respiratory distress, and will be observed on the telemetry unit for ongoing evaluation and management.     Hospital Course   Pleuritic chest pain; elevated d-dimer; CAD -Presents with several days of pleuritic chest pain and mild SOB; was evaluated by PCP, found to have elevated d-dimer, and sent to ED, he is with renal failure, and contrast allergy allergy, so  VQ scan was obtained which was very low probability, chest pain appears to be nontypical, musculoskeletal quality reproducible by palpation, happens at rest, had 3- cardiac enzymes  overnight, I have discussed with his primary cardiologist [name], at this point no indication for further work-up, and he is to follow with him as an outpatient in 1 to 2 weeks to see if further work-up as indicated .  Acute kidney injury superimposed on CKD stage III  - SCr is 2.26 on admission, up from 1.37 two weeks earlierultrasound was obtained and findings are indicative of medical renal disease, held his lisinopril and torsemide during hospital stay, and he was kept on gentle hydration, creatinine 1.5 today, was instructed to resume torsemide and 24 hours, and resume lisinopril tomorrow, will need repeat BMP at PCP office during next visit.  Insulin-dependent DM -A1c was 7.1% in April -Managed at home with Lantus 60 units qHS and Humalog 15 units TID -Check CBG's and start with Lantus 40 units qHS and Novolog correctional while in hospital  Chronic pain -No pain complaints on admission -Continue home-regimen with fentanyl patch, Lyrica, prn Percocet, and prn Dilaudid  Chronic diastolic CHF -Appears euvolemic on admission -Torsemide in 24 hours from discharge  Normocytic anemia -Patient hemoglobin 7.5 this a.m. after hydration, no evidence of bleed, normal stool color and colostomy bag, anemia panel significant for iron deficiency, folic acid within normal limits, and mild iron deficiency anemia, patient will be transfused 1 unit PRBC for discharge given his known history of CAD, we discharged on S17 supplement, folic acid and iron tablet.  History of CAD -Continue with statin, aspirin and Plavix      Discharge Condition:  Stable   Follow UP  Follow-up Information    Isaias Sakai, DO Follow up in 1 week(s).   Specialty:  Family Medicine Contact  information: [place] [address] [phone]        Adrian Prows, MD Follow up in 1 week(s).   Specialty:  Cardiology Contact information: [place] [address] [phone]             Discharge Instructions  and  Discharge Medications     Discharge Instructions    Discharge instructions   Complete by:  As directed    ollow with Primary MD Isaias Sakai, DO in 7 days   Get CBC, CMP,  checked  by Primary MD next visit.    Activity: As tolerated with Full fall precautions use walker/cane & assistance as needed   Disposition Home    Diet: Heart Healthy , with feeding assistance and aspiration precautions.  For Heart failure patients - Check your Weight same time everyday, if you gain over 2 pounds, or you develop in leg swelling, experience more shortness of breath or chest pain, call your Primary MD immediately. Follow Cardiac Low Salt Diet and 1.5 lit/day fluid restriction.   On your next visit with your primary care physician please Get Medicines reviewed and adjusted.   Please request your Prim.MD to go over all Hospital Tests and Procedure/Radiological results at the follow up, please get all Hospital records sent to your Prim MD by signing hospital release before you go home.   If you experience worsening of your admission symptoms, develop shortness of breath, life threatening emergency, suicidal or homicidal thoughts you must seek medical attention immediately by calling 911 or calling your MD immediately  if symptoms less severe.  You Must read complete instructions/literature along with all the possible adverse reactions/side effects for all the Medicines you take and that have been prescribed to you. Take any new Medicines after you have  completely understood and accpet all the possible adverse reactions/side effects.   Do not drive, operating heavy machinery, perform activities at heights, swimming or  participation in water activities or provide baby sitting services if your were admitted for syncope or siezures until you have seen by Primary MD or a Neurologist and advised to do so again.  Do not drive when taking Pain medications.    Do not take more than prescribed Pain, Sleep and Anxiety Medications  Special Instructions: If you have smoked or chewed Tobacco  in the last 2 yrs please stop smoking, stop any regular Alcohol  and or any Recreational drug use.  Wear Seat belts while driving.   Please note  You were cared for by a hospitalist during your hospital stay. If you have any questions about your discharge medications or the care you received while you were in the hospital after you are discharged, you can call the unit and asked to speak with the hospitalist on call if the hospitalist that took care of you is not available. Once you are discharged, your primary care physician will handle any further medical issues. Please note that NO REFILLS for any discharge medications will be authorized once you are discharged, as it is imperative that you return to your primary care physician (or establish a relationship with a primary care physician if you do not have one) for your aftercare needs so that they can reassess your need for medications and monitor your lab values.   Increase activity slowly   Complete by:  As directed      Allergies as of 02/15/2018      Reactions   Ivp Dye [iodinated Diagnostic Agents] Anaphylaxis, Other (See Comments)   Breathing problems   Adhesive [tape] Rash   Rash after 2-3 days use      Medication List    TAKE these medications   aspirin EC 81 MG tablet Take 81 mg by mouth daily.   atorvastatin 40 MG tablet Commonly known as:  LIPITOR Take 40 mg by mouth daily.   BASAGLAR KWIKPEN 100 UNIT/ML Sopn Inject 60 Units into the skin at bedtime.   carvedilol 25 MG tablet Commonly known as:  COREG Take 1.5 tablets (37.5 mg total) by mouth 2 (two)  times daily.   clobetasol 0.05 % external solution Commonly known as:  TEMOVATE Apply 1 application topically daily as needed (dry scalp).   clopidogrel 75 MG tablet Commonly known as:  PLAVIX Take 75 mg by mouth daily.   clotrimazole-betamethasone cream Commonly known as:  LOTRISONE Apply 1 application topically daily as needed (dry skin - beard).   collagenase ointment Commonly known as:  SANTYL Apply topically daily. What changed:    how much to take  additional instructions   cyanocobalamin 1000 MCG/ML injection Commonly known as:  (VITAMIN B-12) Inject 1 mL (1,000 mcg total) into the muscle as directed. Please take IM once daily for 5 days, then once monthly.   fentaNYL 50 MCG/HR Commonly known as:  DURAGESIC - dosed mcg/hr Place 1 patch (50 mcg total) onto the skin every 3 (three) days.   ferrous sulfate 325 (65 FE) MG EC tablet Take 1 tablet (325 mg total) by mouth 2 (two) times daily.   fluticasone 0.05 % cream Commonly known as:  CUTIVATE Apply 1 application topically 2 (two) times daily as needed (dry skin on face).   folic acid 1 MG tablet Commonly known as:  FOLVITE Take 1 tablet (1 mg total) by  mouth daily.   HUMALOG KWIKPEN 100 UNIT/ML KiwkPen Generic drug:  insulin lispro Inject 15 Units into the skin 3 (three) times daily with meals. 15 units with breakfast, lunch, & dinner-- pt may increase dose by 1 unit if sugar is elevated   HYDROmorphone 2 MG tablet Commonly known as:  DILAUDID Take 1 tablet (2 mg total) by mouth at bedtime. What changed:    when to take this  reasons to take this   isosorbide mononitrate 120 MG 24 hr tablet Commonly known as:  IMDUR Take 120 mg by mouth daily.   lisinopril 20 MG tablet Commonly known as:  PRINIVIL,ZESTRIL Take 10 mg by mouth daily.   metFORMIN 1000 MG tablet Commonly known as:  GLUCOPHAGE Take 1,000 mg by mouth 2 (two) times daily.   metoCLOPramide 10 MG tablet Commonly known as:  REGLAN Take  1 tablet (10 mg total) by mouth 3 (three) times daily with meals. What changed:    when to take this  reasons to take this   multivitamin with minerals Tabs tablet Take 1 tablet by mouth daily.   nitroGLYCERIN 0.4 MG SL tablet Commonly known as:  NITROSTAT Place 1 tablet (0.4 mg total) under the tongue every 5 (five) minutes as needed (as needed for esophageal spasm). What changed:    reasons to take this  Another medication with the same name was removed. Continue taking this medication, and follow the directions you see here.   omega-3 acid ethyl esters 1 g capsule Commonly known as:  LOVAZA Take 2 g by mouth 2 (two) times daily.   ondansetron 4 MG disintegrating tablet Commonly known as:  ZOFRAN-ODT Take 4 mg by mouth 4 (four) times daily as needed for nausea.   oxyCODONE-acetaminophen 5-325 MG tablet Commonly known as:  PERCOCET/ROXICET Take 1 tablet by mouth every 8 (eight) hours as needed. What changed:  when to take this   pantoprazole 40 MG tablet Commonly known as:  PROTONIX Take 1 tablet (40 mg total) by mouth daily before breakfast.   pregabalin 100 MG capsule Commonly known as:  LYRICA Take 1 capsule (100 mg total) by mouth 3 (three) times daily.   Probiotic Caps Take 2 capsules by mouth daily after breakfast.   rOPINIRole 2 MG tablet Commonly known as:  REQUIP Take 2 mg by mouth at bedtime.   SYSTANE BALANCE OP Place 2-3 drops into both eyes daily as needed (for dry eyes).   tamsulosin 0.4 MG Caps capsule Commonly known as:  FLOMAX Take 0.4 mg by mouth daily.   torsemide 20 MG tablet Commonly known as:  DEMADEX Take 1 tablet (20 mg total) by mouth every morning. MAY TAKE AN EXTRA 20 MG ONCE A DAY FOR EXCESSIVE FLUID RETENTION -Please resume on 02/17/2018 What changed:  additional instructions         Diet and Activity recommendation: See Discharge Instructions above   Consults obtained -  None  Major procedures and Radiology Reports -  PLEASE review detailed and final reports for all details, in brief -      Dg Chest 2 View  Result Date: 02/14/2018 CLINICAL DATA:  Acute onset of generalized chest pain. Elevated D-dimer. Shortness of breath, nausea and dizziness. EXAM: CHEST - 2 VIEW COMPARISON:  Chest radiograph performed 01/18/2018 FINDINGS: The lungs are well-aerated. Mild peribronchial thickening is noted. Mild bibasilar opacities may reflect atelectasis or mild pneumonia. There is no evidence of pleural effusion or pneumothorax. The heart is normal in size; the mediastinal  contour is within normal limits. No acute osseous abnormalities are seen. IMPRESSION: Mild peribronchial thickening. Mild bibasilar opacities may reflect atelectasis or mild pneumonia. Electronically Signed   By: Garald Balding M.D.   On: 02/14/2018 01:10   Dg Abd 1 View  Result Date: 01/18/2018 CLINICAL DATA:  Check nasogastric catheter placement EXAM: ABDOMEN - 1 VIEW COMPARISON:  Film from earlier in the same day. FINDINGS: Nasogastric catheter has now been placed within the stomach. There remains mild small bowel dilatation. Degenerative changes of lumbar spine are noted. IMPRESSION: Nasogastric catheter within the stomach. Electronically Signed   By: Inez Catalina M.D.   On: 01/18/2018 19:08   US Renal  Result Date: 02/14/2018 CLINICAL DATA:  Acute renal insufficiency superimposed on stage III chronic renal disease EXAM: RENAL / URINARY TRACT ULTRASOUND COMPLETE COMPARISON:  None. FINDINGS: Right Kidney: Length: 12.3 cm. Echogenicity is increased. Renal cortical thickness is within normal limits. No mass, perinephric fluid, or hydronephrosis visualized. No sonographically demonstrable calculus or ureterectasis. Left Kidney: Length: 12.5 cm. Echogenicity is slightly increased. Renal cortical thickness is normal. No mass, perinephric fluid, or hydronephrosis visualized. No sonographically demonstrable calculus or ureterectasis. Bladder: Appears normal for  degree of bladder distention. Penile prosthesis reservoir is present adjacent to the bladder. Reservoir appears intact. IMPRESSION: Kidneys appear somewhat echogenic, a finding indicative of medical renal disease. Renal cortical thickness normal bilaterally. No obstructing focus evident on either side. Kidneys otherwise appear unremarkable. Intact penile prosthesis reservoir noted in the pelvis. Electronically Signed   By: Lowella Grip III M.D.   On: 02/14/2018 08:51   Nm Pulmonary Perf And Vent  Result Date: 02/14/2018 CLINICAL DATA:  Chest pain and shortness of breath EXAM: NUCLEAR MEDICINE VENTILATION - PERFUSION LUNG SCAN VIEWS: Anterior, posterior, left lateral, right lateral, RPO, LPO, RAO, LAO-ventilation and perfusion RADIOPHARMACEUTICALS:  32.8 mCi of Tc-36m DTPA aerosol inhalation and 4.3 mCi Tc54m-MAA IV COMPARISON:  Chest radiograph February 14, 2018 FINDINGS: Ventilation: Radiotracer uptake is homogeneous and symmetric bilaterally. No appreciable ventilation defects. Perfusion: Radiotracer uptake is homogeneous and symmetric bilaterally. There are no appreciable perfusion defects. IMPRESSION: No appreciable ventilation or perfusion defects. This study constitutes a very low probability of pulmonary embolus. Electronically Signed   By: Lowella Grip III M.D.   On: 02/14/2018 14:24   Dg Abd Acute W/chest  Result Date: 01/18/2018 CLINICAL DATA:  Ileus, history hypertension, diabetes mellitus, GERD, COPD, CHF EXAM: DG ABDOMEN ACUTE W/ 1V CHEST COMPARISON:  Chest radiograph 01/14/2018, abdominal radiograph 01/14/2018 FINDINGS: Enlargement of cardiac silhouette. Atherosclerotic calcification aorta. Lungs clear. No pleural effusion or pneumothorax. Interval removal of nasogastric tube. Prior lumbosacral fusion L4-S1. Diffuse dilatation of small bowel loops throughout the abdomen. Anterior skin clips at pelvis. Paucity of colonic gas. Findings favor postoperative ileus. No bowel wall thickening or  free air. Bones demineralized with scattered degenerative disc disease changes of the thoracolumbar spine. IMPRESSION: Probable postoperative ileus. Electronically Signed   By: Lavonia Dana M.D.   On: 01/18/2018 12:59    Micro Results     No results found for this or any previous visit (from the past 240 hour(s)).     Today   Subjective:   Karim Aiello today has no headache,no abdominal pain, reproducible chest pain on palpation, no new weakness tingling or numbness, feels much better wants to go home today.   Objective:   Blood pressure (!) 156/64, pulse 81, temperature 99 F (37.2 C), temperature source Oral, resp. rate 17, weight 110.7 kg (244 lb 0.8  oz), SpO2 99 %.   Intake/Output Summary (Last 24 hours) at 02/15/2018 1541 Last data filed at 02/15/2018 1500 Gross per 24 hour  Intake 1803.75 ml  Output 700 ml  Net 1103.75 ml    Exam Awake Alert, Oriented X 3, No new F.N deficits, Normal affect Symmetrical Chest wall movement, Good air movement bilaterally, CTAB, has reproducible chest pain on palpation RRR,No Gallops,Rubs or new Murmurs, No Parasternal Heave +ve B.Sounds, Abd Soft, No tenderness,  RLQ colostomy + with normal light brown-colored stool, midline surgical wound healing with no discharge or oozing, No rebound - guarding or rigidity. Status-post right BKA.     Data Review   CBC w Diff:  Lab Results  Component Value Date   WBC 4.2 02/15/2018   HGB 7.5 (L) 02/15/2018   HCT 22.6 (L) 02/15/2018   PLT 153 02/15/2018   LYMPHOPCT 12 01/19/2018   MONOPCT 7 01/19/2018   EOSPCT 3 01/19/2018   BASOPCT 0 01/19/2018    CMP:  Lab Results  Component Value Date   NA 142 02/15/2018   K 4.3 02/15/2018   CL 113 (H) 02/15/2018   CO2 24 02/15/2018   BUN 19 02/15/2018   CREATININE 1.50 (H) 02/15/2018   CREATININE 1.00 02/01/2012   PROT 7.1 01/07/2018   ALBUMIN 3.4 (L) 01/07/2018   BILITOT 0.5 01/07/2018   ALKPHOS 117 01/07/2018   AST 21 01/07/2018   ALT 13  (L) 01/07/2018  .   Total Time in preparing paper work, data evaluation and todays exam - 3 minutes  Phillips Climes M.D on 02/15/2018 at 3:41 PM  [facility]   Office  [phone]

## 2018-02-15 NOTE — Discharge Instructions (Signed)
Follow with Primary MD Isaias Sakai, DO in 7 days   Get CBC, CMP,  checked  by Primary MD next visit.    Activity: As tolerated with Full fall precautions use walker/cane & assistance as needed   Disposition Home    Diet: Heart Healthy , with feeding assistance and aspiration precautions.  For Heart failure patients - Check your Weight same time everyday, if you gain over 2 pounds, or you develop in leg swelling, experience more shortness of breath or chest pain, call your Primary MD immediately. Follow Cardiac Low Salt Diet and 1.5 lit/day fluid restriction.   On your next visit with your primary care physician please Get Medicines reviewed and adjusted.   Please request your Prim.MD to go over all Hospital Tests and Procedure/Radiological results at the follow up, please get all Hospital records sent to your Prim MD by signing hospital release before you go home.   If you experience worsening of your admission symptoms, develop shortness of breath, life threatening emergency, suicidal or homicidal thoughts you must seek medical attention immediately by calling 911 or calling your MD immediately  if symptoms less severe.  You Must read complete instructions/literature along with all the possible adverse reactions/side effects for all the Medicines you take and that have been prescribed to you. Take any new Medicines after you have completely understood and accpet all the possible adverse reactions/side effects.   Do not drive, operating heavy machinery, perform activities at heights, swimming or participation in water activities or provide baby sitting services if your were admitted for syncope or siezures until you have seen by Primary MD or a Neurologist and advised to do so again.  Do not drive when taking Pain medications.    Do not take more than prescribed Pain, Sleep and Anxiety Medications  Special Instructions: If you have smoked or chewed Tobacco  in the last  2 yrs please stop smoking, stop any regular Alcohol  and or any Recreational drug use.  Wear Seat belts while driving.   Please note  You were cared for by a hospitalist during your hospital stay. If you have any questions about your discharge medications or the care you received while you were in the hospital after you are discharged, you can call the unit and asked to speak with the hospitalist on call if the hospitalist that took care of you is not available. Once you are discharged, your primary care physician will handle any further medical issues. Please note that NO REFILLS for any discharge medications will be authorized once you are discharged, as it is imperative that you return to your primary care physician (or establish a relationship with a primary care physician if you do not have one) for your aftercare needs so that they can reassess your need for medications and monitor your lab values.

## 2018-02-16 LAB — TYPE AND SCREEN
ABO/RH(D): AB NEG
Antibody Screen: NEGATIVE
Unit division: 0

## 2018-02-16 LAB — BPAM RBC
Blood Product Expiration Date: 201906252359
ISSUE DATE / TIME: 201906081320
Unit Type and Rh: 600

## 2018-02-21 DIAGNOSIS — E1151 Type 2 diabetes mellitus with diabetic peripheral angiopathy without gangrene: Secondary | ICD-10-CM | POA: Diagnosis not present

## 2018-02-21 DIAGNOSIS — R600 Localized edema: Secondary | ICD-10-CM | POA: Diagnosis not present

## 2018-02-21 DIAGNOSIS — N179 Acute kidney failure, unspecified: Secondary | ICD-10-CM | POA: Diagnosis not present

## 2018-02-21 DIAGNOSIS — I739 Peripheral vascular disease, unspecified: Secondary | ICD-10-CM | POA: Diagnosis not present

## 2018-02-21 DIAGNOSIS — D638 Anemia in other chronic diseases classified elsewhere: Secondary | ICD-10-CM | POA: Diagnosis not present

## 2018-02-21 DIAGNOSIS — I25119 Atherosclerotic heart disease of native coronary artery with unspecified angina pectoris: Secondary | ICD-10-CM | POA: Diagnosis not present

## 2018-03-03 DIAGNOSIS — Z933 Colostomy status: Secondary | ICD-10-CM | POA: Diagnosis not present

## 2018-03-03 DIAGNOSIS — E1140 Type 2 diabetes mellitus with diabetic neuropathy, unspecified: Secondary | ICD-10-CM | POA: Diagnosis not present

## 2018-03-03 DIAGNOSIS — I9589 Other hypotension: Secondary | ICD-10-CM | POA: Diagnosis not present

## 2018-03-07 DIAGNOSIS — Z79899 Other long term (current) drug therapy: Secondary | ICD-10-CM | POA: Diagnosis not present

## 2018-03-07 DIAGNOSIS — G894 Chronic pain syndrome: Secondary | ICD-10-CM | POA: Diagnosis not present

## 2018-04-07 ENCOUNTER — Other Ambulatory Visit: Payer: Self-pay | Admitting: Surgery

## 2018-04-07 DIAGNOSIS — K5792 Diverticulitis of intestine, part unspecified, without perforation or abscess without bleeding: Secondary | ICD-10-CM

## 2018-04-18 ENCOUNTER — Ambulatory Visit
Admission: RE | Admit: 2018-04-18 | Discharge: 2018-04-18 | Disposition: A | Discharge status: Home / Self Care | Payer: 59 | Source: Ambulatory Visit | Attending: Surgery | Admitting: Surgery

## 2018-04-18 DIAGNOSIS — K5730 Diverticulosis of large intestine without perforation or abscess without bleeding: Secondary | ICD-10-CM | POA: Diagnosis not present

## 2018-04-18 DIAGNOSIS — K5792 Diverticulitis of intestine, part unspecified, without perforation or abscess without bleeding: Secondary | ICD-10-CM

## 2018-04-28 ENCOUNTER — Ambulatory Visit: Payer: Self-pay | Admitting: Surgery

## 2018-05-14 ENCOUNTER — Encounter (HOSPITAL_COMMUNITY): Payer: Self-pay

## 2018-05-14 NOTE — Pre-Procedure Instructions (Signed)
[name]  05/14/2018      [id number] - [place], [place] - [phone] AT [address] [place] [address] Phone: [phone] Fax: [phone]    Your procedure is scheduled on Wednesday September 11th.  Report to [employer] Admitting at 0830 A.M.  Call this number if you have problems the morning of surgery:  [phone]   Remember:  Do not eat or drink after midnight.     Take these medicines the morning of surgery with A SIP OF WATER   Atorvastatin  Coreg  Isosorbide (Imdur)  Reglan (if needed)  Percocet (if needed)  Protonix  Lyrica  Eye drops (if needed)  Flomax  Follow Surgeons instructions for when to stop taking aspirin and plavix.  7 days prior to surgery STOP taking any Aspirin(unless otherwise instructed by your surgeon), Aleve, Naproxen, Ibuprofen, Motrin, Advil, Goody's, BC's, all herbal medications, fish oil, and all vitamins    WHAT DO I DO ABOUT MY DIABETES MEDICATION?   Marland Kitchen Do not take oral diabetes medicines (pills) the morning of surgery. Metformin  . THE NIGHT BEFORE SURGERY, take 30 units of Lantus insulin.       . THE MORNING OF SURGERY, Do NOT take any insulin.  . The day of surgery, do not take other diabetes injectables, including Byetta (exenatide), Bydureon (exenatide ER), Victoza (liraglutide), or Trulicity (dulaglutide).  . If your CBG is greater than 220 mg/dL, you may take  of your sliding scale (correction) dose of insulin.   How to Manage Your Diabetes Before and After Surgery  Why is it important to control my blood sugar before and after surgery? . Improving blood sugar levels before and after surgery helps healing and can limit problems. . A way of improving blood sugar control is eating a healthy diet by: o  Eating less sugar and carbohydrates o  Increasing activity/exercise o  Talking with your doctor about reaching your blood sugar  goals . High blood sugars (greater than 180 mg/dL) can raise your risk of infections and slow your recovery, so you will need to focus on controlling your diabetes during the weeks before surgery. . Make sure that the doctor who takes care of your diabetes knows about your planned surgery including the date and location.  How do I manage my blood sugar before surgery? . Check your blood sugar at least 4 times a day, starting 2 days before surgery, to make sure that the level is not too high or low. o Check your blood sugar the morning of your surgery when you wake up and every 2 hours until you get to the Short Stay unit. . If your blood sugar is less than 70 mg/dL, you will need to treat for low blood sugar: o Do not take insulin. o Treat a low blood sugar (less than 70 mg/dL) with  cup of clear juice (cranberry or apple), 4 glucose tablets, OR glucose gel. o Recheck blood sugar in 15 minutes after treatment (to make sure it is greater than 70 mg/dL). If your blood sugar is not greater than 70 mg/dL on recheck, call [phone] for further instructions. . Report your blood sugar to the short stay nurse when you get to Short Stay.  . If you are admitted to the hospital after surgery: o Your blood sugar will be checked by the staff and you will probably be given insulin after surgery (instead of oral  diabetes medicines) to make sure you have good blood sugar levels. o The goal for blood sugar control after surgery is 80-180 mg/dL.    Do not wear jewelry, make-up or nail polish.  Do not wear lotions, powders, or perfumes, or deodorant.  Do not shave 48 hours prior to surgery.  Men may shave face and neck.  Do not bring valuables to the hospital.  [facility] is not responsible for any belongings or valuables.  Contacts, dentures or bridgework may not be worn into surgery.  Leave your suitcase in the car.  After surgery it may be brought to your room.  For patients admitted to the hospital,  discharge time will be determined by your treatment team.  Patients discharged the day of surgery will not be allowed to drive home.    [facility]- Preparing For Surgery  Before surgery, you can play an important role. Because skin is not sterile, your skin needs to be as free of germs as possible. You can reduce the number of germs on your skin by washing with CHG (chlorahexidine gluconate) Soap before surgery.  CHG is an antiseptic cleaner which kills germs and bonds with the skin to continue killing germs even after washing.    Oral Hygiene is also important to reduce your risk of infection.  Remember - BRUSH YOUR TEETH THE MORNING OF SURGERY WITH YOUR REGULAR TOOTHPASTE  Please do not use if you have an allergy to CHG or antibacterial soaps. If your skin becomes reddened/irritated stop using the CHG.  Do not shave (including legs and underarms) for at least 48 hours prior to first CHG shower. It is OK to shave your face.  Please follow these instructions carefully.   1. Shower the NIGHT BEFORE SURGERY and the MORNING OF SURGERY with CHG.   2. If you chose to wash your hair, wash your hair first as usual with your normal shampoo.  3. After you shampoo, rinse your hair and body thoroughly to remove the shampoo.  4. Use CHG as you would any other liquid soap. You can apply CHG directly to the skin and wash gently with a scrungie or a clean washcloth.   5. Apply the CHG Soap to your body ONLY FROM THE NECK DOWN.  Do not use on open wounds or open sores. Avoid contact with your eyes, ears, mouth and genitals (private parts). Wash Face and genitals (private parts)  with your normal soap.  6. Wash thoroughly, paying special attention to the area where your surgery will be performed.  7. Thoroughly rinse your body with warm water from the neck down.  8. DO NOT shower/wash with your normal soap after using and rinsing off the CHG Soap.  9. Pat yourself dry with a CLEAN TOWEL.  10. Wear  CLEAN PAJAMAS to bed the night before surgery, wear comfortable clothes the morning of surgery  11. Place CLEAN SHEETS on your bed the night of your first shower and DO NOT SLEEP WITH PETS.    Day of Surgery:  Do not apply any deodorants/lotions.  Please wear clean clothes to the hospital/surgery center.   Remember to brush your teeth WITH YOUR REGULAR TOOTHPASTE.    Please read over the following fact sheets that you were given.

## 2018-05-15 ENCOUNTER — Encounter (HOSPITAL_COMMUNITY)
Admission: RE | Admit: 2018-05-15 | Discharge: 2018-05-15 | Disposition: A | Discharge status: Home / Self Care | Payer: 59 | Source: Ambulatory Visit | Attending: Surgery | Admitting: Surgery

## 2018-05-15 ENCOUNTER — Encounter (HOSPITAL_COMMUNITY): Payer: Self-pay

## 2018-05-15 ENCOUNTER — Ambulatory Visit (HOSPITAL_COMMUNITY)
Admission: RE | Admit: 2018-05-15 | Discharge: 2018-05-15 | Disposition: A | Discharge status: Home / Self Care | Payer: 59 | Source: Ambulatory Visit | Attending: Surgery | Admitting: Surgery

## 2018-05-15 DIAGNOSIS — E1140 Type 2 diabetes mellitus with diabetic neuropathy, unspecified: Secondary | ICD-10-CM | POA: Insufficient documentation

## 2018-05-15 DIAGNOSIS — K219 Gastro-esophageal reflux disease without esophagitis: Secondary | ICD-10-CM | POA: Diagnosis not present

## 2018-05-15 DIAGNOSIS — Z79899 Other long term (current) drug therapy: Secondary | ICD-10-CM | POA: Insufficient documentation

## 2018-05-15 DIAGNOSIS — Z9889 Other specified postprocedural states: Secondary | ICD-10-CM | POA: Insufficient documentation

## 2018-05-15 DIAGNOSIS — M1990 Unspecified osteoarthritis, unspecified site: Secondary | ICD-10-CM | POA: Diagnosis not present

## 2018-05-15 DIAGNOSIS — G4733 Obstructive sleep apnea (adult) (pediatric): Secondary | ICD-10-CM | POA: Diagnosis not present

## 2018-05-15 DIAGNOSIS — Z01818 Encounter for other preprocedural examination: Secondary | ICD-10-CM | POA: Insufficient documentation

## 2018-05-15 DIAGNOSIS — J449 Chronic obstructive pulmonary disease, unspecified: Secondary | ICD-10-CM | POA: Diagnosis not present

## 2018-05-15 DIAGNOSIS — Z794 Long term (current) use of insulin: Secondary | ICD-10-CM | POA: Insufficient documentation

## 2018-05-15 DIAGNOSIS — E1151 Type 2 diabetes mellitus with diabetic peripheral angiopathy without gangrene: Secondary | ICD-10-CM | POA: Insufficient documentation

## 2018-05-15 DIAGNOSIS — N183 Chronic kidney disease, stage 3 (moderate): Secondary | ICD-10-CM | POA: Diagnosis not present

## 2018-05-15 DIAGNOSIS — I2510 Atherosclerotic heart disease of native coronary artery without angina pectoris: Secondary | ICD-10-CM | POA: Diagnosis not present

## 2018-05-15 DIAGNOSIS — Z8673 Personal history of transient ischemic attack (TIA), and cerebral infarction without residual deficits: Secondary | ICD-10-CM | POA: Diagnosis not present

## 2018-05-15 DIAGNOSIS — Z9049 Acquired absence of other specified parts of digestive tract: Secondary | ICD-10-CM | POA: Insufficient documentation

## 2018-05-15 DIAGNOSIS — Z7982 Long term (current) use of aspirin: Secondary | ICD-10-CM | POA: Insufficient documentation

## 2018-05-15 DIAGNOSIS — Z955 Presence of coronary angioplasty implant and graft: Secondary | ICD-10-CM | POA: Diagnosis not present

## 2018-05-15 DIAGNOSIS — I130 Hypertensive heart and chronic kidney disease with heart failure and stage 1 through stage 4 chronic kidney disease, or unspecified chronic kidney disease: Secondary | ICD-10-CM | POA: Diagnosis not present

## 2018-05-15 DIAGNOSIS — E785 Hyperlipidemia, unspecified: Secondary | ICD-10-CM | POA: Diagnosis not present

## 2018-05-15 DIAGNOSIS — E1122 Type 2 diabetes mellitus with diabetic chronic kidney disease: Secondary | ICD-10-CM | POA: Diagnosis not present

## 2018-05-15 DIAGNOSIS — R0989 Other specified symptoms and signs involving the circulatory and respiratory systems: Secondary | ICD-10-CM | POA: Diagnosis not present

## 2018-05-15 DIAGNOSIS — Z01810 Encounter for preprocedural cardiovascular examination: Secondary | ICD-10-CM

## 2018-05-15 DIAGNOSIS — Z89511 Acquired absence of right leg below knee: Secondary | ICD-10-CM | POA: Insufficient documentation

## 2018-05-15 DIAGNOSIS — Z01812 Encounter for preprocedural laboratory examination: Secondary | ICD-10-CM | POA: Insufficient documentation

## 2018-05-15 DIAGNOSIS — Z96659 Presence of unspecified artificial knee joint: Secondary | ICD-10-CM | POA: Insufficient documentation

## 2018-05-15 DIAGNOSIS — I5032 Chronic diastolic (congestive) heart failure: Secondary | ICD-10-CM | POA: Insufficient documentation

## 2018-05-15 LAB — HEMOGLOBIN A1C
Hgb A1c MFr Bld: 7.7 % — ABNORMAL HIGH (ref 4.8–5.6)
Mean Plasma Glucose: 174.29 mg/dL

## 2018-05-15 LAB — COMPREHENSIVE METABOLIC PANEL
ALT: 19 U/L (ref 0–44)
AST: 20 U/L (ref 15–41)
Albumin: 3.5 g/dL (ref 3.5–5.0)
Alkaline Phosphatase: 139 U/L — ABNORMAL HIGH (ref 38–126)
Anion gap: 9 (ref 5–15)
BUN: 16 mg/dL (ref 8–23)
CO2: 19 mmol/L — ABNORMAL LOW (ref 22–32)
Calcium: 8.9 mg/dL (ref 8.9–10.3)
Chloride: 107 mmol/L (ref 98–111)
Creatinine, Ser: 1.76 mg/dL — ABNORMAL HIGH (ref 0.61–1.24)
GFR calc Af Amer: 46 mL/min — ABNORMAL LOW (ref >60)
GFR calc non Af Amer: 39 mL/min — ABNORMAL LOW (ref >60)
Glucose, Bld: 252 mg/dL — ABNORMAL HIGH (ref 70–99)
Potassium: 3.9 mmol/L (ref 3.5–5.1)
Sodium: 135 mmol/L (ref 135–145)
Total Bilirubin: 0.8 mg/dL (ref 0.3–1.2)
Total Protein: 6.6 g/dL (ref 6.5–8.1)

## 2018-05-15 LAB — CBC WITH DIFFERENTIAL/PLATELET
Abs Immature Granulocytes: 0 10*3/uL (ref 0.0–0.1)
Basophils Absolute: 0 10*3/uL (ref 0.0–0.1)
Basophils Relative: 1 %
Eosinophils Absolute: 0.2 10*3/uL (ref 0.0–0.7)
Eosinophils Relative: 4 %
HCT: 30.7 % — ABNORMAL LOW (ref 39.0–52.0)
Hemoglobin: 10.6 g/dL — ABNORMAL LOW (ref 13.0–17.0)
Immature Granulocytes: 0 %
Lymphocytes Relative: 29 %
Lymphs Abs: 1.8 10*3/uL (ref 0.7–4.0)
MCH: 28.8 pg (ref 26.0–34.0)
MCHC: 34.5 g/dL (ref 30.0–36.0)
MCV: 83.4 fL (ref 78.0–100.0)
Monocytes Absolute: 0.5 10*3/uL (ref 0.1–1.0)
Monocytes Relative: 8 %
Neutro Abs: 3.7 10*3/uL (ref 1.7–7.7)
Neutrophils Relative %: 58 %
Platelets: 180 10*3/uL (ref 150–400)
RBC: 3.68 MIL/uL — ABNORMAL LOW (ref 4.22–5.81)
RDW: 13.3 % (ref 11.5–15.5)
WBC: 6.3 10*3/uL (ref 4.0–10.5)

## 2018-05-15 LAB — SURGICAL PCR SCREEN
MRSA, PCR: POSITIVE — AB
Staphylococcus aureus: POSITIVE — AB

## 2018-05-15 LAB — GLUCOSE, CAPILLARY: Glucose-Capillary: 295 mg/dL — ABNORMAL HIGH (ref 70–99)

## 2018-05-15 MED ORDER — CHLORHEXIDINE GLUCONATE 4 % EX LIQD: 60.0000 mL | Freq: Once | CUTANEOUS | Status: DC

## 2018-05-16 DIAGNOSIS — Z932 Ileostomy status: Secondary | ICD-10-CM | POA: Diagnosis not present

## 2018-05-16 DIAGNOSIS — E11618 Type 2 diabetes mellitus with other diabetic arthropathy: Secondary | ICD-10-CM | POA: Diagnosis not present

## 2018-05-16 DIAGNOSIS — Z4781 Encounter for orthopedic aftercare following surgical amputation: Secondary | ICD-10-CM | POA: Diagnosis not present

## 2018-05-16 NOTE — Progress Notes (Addendum)
Anesthesia Chart Review:  Case:  [id number] Date/Time:  05/21/18 1015   Procedure:  [place] (N/A )   Anesthesia type:  General   Pre-op diagnosis:  ileostomy   Location:  [facility] OR ROOM 07 / [place] [facility]   Surgeon:  Clovis Riley, MD      DISCUSSION: 62 yo male current smoker for above procedure. Medical history significant forHTN, IDDM, Anemia, COPD, OSA, CKD III, chronic diastolic CHF (EF 26-71% by Echo 2016), CAD (s/p PTCA and stenting of the entire RCA with 3 overlapping DES  10/29/2014), foot ulcer status post right BKA, PAD (s/p left popliteal artery atherectomy in June 2012, right below knee angioplasty in 2013,and left below knee PTA [id number]) and recent diverticulitis with perforation status post sigmoid colectomy.  Pt recently admitted 6/7-02/15/2018 for chest pain. EKG showed sinus rhythm with RBBB and LAFB, similar to prior. AKI on CKD III with Cr 2.26 up from 1.37. VQ scan was obtained which was very low probability, chest pain appeared to be nontypical, musculoskeletal quality reproducible by palpation, happens at rest, had 3 neg cardiac enzymes, case was discussed with his primary cardiologist [name], it was felt there was no indication for further work-up.  Anticipate he can proceed with surgery as planned barring acute status change  VS: There were no vitals taken for this visit.  PROVIDERS: Isaias Sakai, DO is PCP  Adrian Prows, MD is Cardiologist last seen during hospitalization 08/01/2016. Discussed pt with hospitalist team during recent admission 6/7-02/15/18.  LABS: Elevated CBG 295 in setting of IDDM. Elevated creatinine 1.76 c/w pt's CKD, baseline appears to be ~1.5 (all labs ordered are listed, but only abnormal results are displayed)  Labs Reviewed  SURGICAL PCR SCREEN - Abnormal; Notable for the following components:      Result Value   MRSA, PCR POSITIVE (*)    Staphylococcus aureus POSITIVE (*)    All other components within normal  limits  GLUCOSE, CAPILLARY - Abnormal; Notable for the following components:   Glucose-Capillary 295 (*)    All other components within normal limits  CBC WITH DIFFERENTIAL/PLATELET - Abnormal; Notable for the following components:   RBC 3.68 (*)    Hemoglobin 10.6 (*)    HCT 30.7 (*)    All other components within normal limits  COMPREHENSIVE METABOLIC PANEL - Abnormal; Notable for the following components:   CO2 19 (*)    Glucose, Bld 252 (*)    Creatinine, Ser 1.76 (*)    Alkaline Phosphatase 139 (*)    GFR calc non Af Amer 39 (*)    GFR calc Af Amer 46 (*)    All other components within normal limits  HEMOGLOBIN A1C - Abnormal; Notable for the following components:   Hgb A1c MFr Bld 7.7 (*)    All other components within normal limits     IMAGES: CHEST - 2 VIEW 05/15/2018  COMPARISON:  02/14/2018  FINDINGS: The heart size and mediastinal contours are within normal limits. Both lungs are clear. The visualized skeletal structures are unremarkable.  Lower cervical fusion hardware partially imaged as before. Trachea is midline. Atherosclerosis of aorta. Degenerative changes of the spine. Coronary atherosclerosis noted.  IMPRESSION: Stable exam.  No acute chest process.  EKG: 02/14/2018: NSR. RBBB. LAFB. Septal infarct, age undetermined.   CV: Carotid Duplex 03/01/2017: Impression: Right internal carotid artery velocities suggest <40% stenosis.  >50% right ECA stenosis.  Patent left carotid endarterectomy site with evidence of mild hyperplasia at the  distal patch site.  No significant change in comparison to last exam 03/23/2016  TTE 09/27/2014: Study Conclusions  - Left ventricle: The cavity size was normal. There was moderate concentric hypertrophy. Systolic function was normal. The estimated ejection fraction was in the range of 55% to 60%. Wall motion was normal; there were no regional wall motion abnormalities. Features are consistent with a  pseudonormal left ventricular filling pattern, with concomitant abnormal relaxation and increased filling pressure (grade 2 diastolic dysfunction). Doppler parameters are consistent with both elevated ventricular end-diastolic filling pressure and elevated left atrial filling pressure. - Mitral valve: Calcified annulus. - Left atrium: The atrium was mildly dilated.  Past Medical History:  Diagnosis Date  . Barrett's esophagus   . Carotid artery occlusion 11/10/10   LEFT CAROTID ENDARTERECTOMY  . CHF (congestive heart failure) ([place])   . Complication of anesthesia    BP WENT UP AT [facility] "  . COPD (chronic obstructive pulmonary disease) ([place])   . Coronary artery disease   . Diabetes mellitus   . Diverticulitis   . Diverticulosis of colon (without mention of hemorrhage)   . DJD (degenerative joint disease)   . Fatty liver   . Full dentures   . GERD (gastroesophageal reflux disease)   . H/O Doppler ultrasound 06/28/2012   Lower venous duplex left-- findings consistent with acute deep vein thrombosis involving the personal vein of the left lower extremity. No evidence of Baker's cyst on the left. Duplex scan of the left lower extremity arteries reveal adequate blood flow.   . H/O hiatal hernia   . History of cardiovascular stress test 03/23/2011   ejection fraction measured 51%, with an end-diastolic volume of 782 ml and an end-systolic volume of 81 ml. no evidence of myocardial ischemia.  Marland Kitchen History of echocardiogram 08/22/2011   Mildly hypertrophic left ventricle with normal systolic function and mild diastolic dysfunction. mildly dilated left atrium. no significant valvular abnormalities. no pericardial effusion no change from previous study 10/2010. the ejection fraction =>55%  . History of Holter monitoring 02/14/2012   Scan quality is fair due to baseline artifact. Predominant rhythm is Sinus. Veentricular Ectopy was noted in single beat form. The total recording time was 24:00  hours. total beats =[id number]. the total time analyzed was 23:58 hours. Heart  Max = 102 at 1:18:22, Min= 52 at 9:56:19 am., Avg= 70. There were 0 pause(s) > 2.5 seconds. the longest pause = 0.00 sec. at 1:51 pm(1)  . Hyperlipidemia   . Hypertension   . Neuromuscular disorder ([place])    peripheral neuropathy  . OSA (obstructive sleep apnea) 12/26/2008   took himself off CPAP; AHI of 6.3/hr overall, 32.7/hr during REM sleep and an RDI of 24.8/hr and 50.9/hr.  . Osteomyelitis ([place])    left 5th metatarsal  . PAD (peripheral artery disease) ([place])    Distal aortogram June 2012. Atherectomy left popliteal artery July 2012.   Marland Kitchen Slurred speech    AS PER WIFE IN D/C NOTE 11/10/10  . Substernal chest pain   . Visual color changes    LEFT EYE; BUT NOT TOTAL BLINDNESS  . Wears glasses     Past Surgical History:  Procedure Laterality Date  . AMPUTATION  11/05/2011   Procedure: AMPUTATION RAY;  Surgeon: Wylene Simmer, MD;  Location: [place];  Service: Orthopedics;  Laterality: Right;  Amputation of Right 4&5th Toes  . AMPUTATION Left 11/26/2012   Procedure: AMPUTATION RAY;  Surgeon: Wylene Simmer, MD;  Location: [facility];  Service: Orthopedics;  Laterality: Left;  fourth ray amputation  . AMPUTATION Right 08/27/2014   Procedure: Transmetatarsal Amputation;  Surgeon: Newt Minion, MD;  Location: [facility];  Service: Orthopedics;  Laterality: Right;  . AMPUTATION Right 01/14/2015   Procedure: AMPUTATION BELOW KNEE;  Surgeon: Newt Minion, MD;  Location: [facility];  Service: Orthopedics;  Laterality: Right;  . AMPUTATION Left 10/21/2015   Procedure: Left Foot 5th Ray Amputation;  Surgeon: Newt Minion, MD;  Location: [facility];  Service: Orthopedics;  Laterality: Left;  . ANTERIOR FUSION CERVICAL SPINE  02/06/06   C4-5, C5-6, C6-7; SURGEON DR. MAX COHEN  . CARDIAC CATHETERIZATION  10/31/04   2009  . CAROTID ENDARTERECTOMY  11/10/10  . CAROTID ENDARTERECTOMY Left 11/10/2010   Subtotal occlusion of left internal carotid artery with  left hemispheric transient ischemic attacks.  . CARPAL TUNNEL RELEASE Right 10/21/2013   Procedure: RIGHT CARPAL TUNNEL RELEASE;  Surgeon: Wynonia Sours, MD;  Location: [facility];  Service: Orthopedics;  Laterality: Right;  . CHOLECYSTECTOMY    . COLON SURGERY    . COLONOSCOPY    . CYSTOSCOPY WITH STENT PLACEMENT Bilateral 01/13/2018   Procedure: CYSTOSCOPY WITH BILATERAL URETERAL CATHETER PLACEMENT;  Surgeon: Ardis Hughs, MD;  Location: [facility];  Service: Urology;  Laterality: Bilateral;  . ESOPHAGEAL MANOMETRY Bilateral 07/19/2014   Procedure: ESOPHAGEAL MANOMETRY (EM);  Surgeon: Jerene Bears, MD;  [facility];  Service: Gastroenterology;  Laterality: Bilateral;  . FEMORAL ARTERY STENT     x6  . FOOT SURGERY  04/25/2016    EXCISION BASE 5TH METATARSAL AND PARTIAL CUBOID LEFT FOOT  . HERNIA REPAIR     LEFT INGUINAL AND UMBILICAL REPAIRS  . I&D EXTREMITY Left 04/25/2016   Procedure: EXCISION BASE 5TH METATARSAL AND PARTIAL CUBOID LEFT FOOT;  Surgeon: Newt Minion, MD;  Location: [facility];  Service: Orthopedics;  Laterality: Left;  . ILEOSTOMY  01/13/2018   Procedure: ILEOSTOMY;  Surgeon: Clovis Riley, MD;  Location: [facility];  Service: General;;  . IR RADIOLOGIST EVAL & MGMT  11/19/2017  . IR RADIOLOGIST EVAL & MGMT  12/03/2017  . IR RADIOLOGIST EVAL & MGMT  12/18/2017  . JOINT REPLACEMENT Right 2001   Total  . LAMINECTOMY     X 3 LUMBAR AND X 2 CERVICAL SPINE OPERATIONS  . LAPAROSCOPIC CHOLECYSTECTOMY W/ CHOLANGIOGRAPHY  11/09/04   SURGEON DR. Luella Cook  . LEFT HEART CATHETERIZATION WITH CORONARY ANGIOGRAM N/A 10/29/2014   Procedure: LEFT HEART CATHETERIZATION WITH CORONARY ANGIOGRAM;  Surgeon: Laverda Page, MD;  Location: [facility] CATH LAB;  Service: Cardiovascular;  Laterality: N/A;  . LOWER EXTREMITY ANGIOGRAM N/A 03/19/2012   Procedure: LOWER EXTREMITY ANGIOGRAM;  Surgeon: Burnell Blanks, MD;  Location: [facility] CATH LAB;  Service: Cardiovascular;   Laterality: N/A;  . NECK SURGERY    . PARTIAL COLECTOMY N/A 01/13/2018   Procedure: LAPAROSCOPIC ASSISTED   SIGMOID COLECTOMY ILEOSTOMY;  Surgeon: Clovis Riley, MD;  [facility];  Service: General;  Laterality: N/A;  . PENILE PROSTHESIS IMPLANT  08/14/05   INFRAPUBIC INSERTION OF INFLATABLE PENILE PROSTHESIS; SURGEON DR. Amalia Hailey  . PERCUTANEOUS CORONARY STENT INTERVENTION (PCI-S) Right 10/29/2014   Procedure: PERCUTANEOUS CORONARY STENT INTERVENTION (PCI-S);  Surgeon: Laverda Page, MD;  Location: [facility] CATH LAB;  Service: Cardiovascular;  Laterality: Right;  . SHOULDER ARTHROSCOPY    . SPINE SURGERY    . TONSILLECTOMY    . TOTAL KNEE ARTHROPLASTY  07/2002   RIGHT KNEE ;  SURGEON  DR. [name] ALSO HAD ARTHROSCOPIC RIGHT KNEE IN  10/2001  . ULNAR NERVE TRANSPOSITION Right 10/21/2013   Procedure: RIGHT ELBOW  ULNAR NERVE DECOMPRESSION;  Surgeon: Wynonia Sours, MD;  Location: [facility];  Service: Orthopedics;  Laterality: Right;    MEDICATIONS: . aspirin EC 81 MG tablet  . atorvastatin (LIPITOR) 40 MG tablet  . carvedilol (COREG) 25 MG tablet  . clobetasol (TEMOVATE) 0.05 % external solution  . clopidogrel (PLAVIX) 75 MG tablet  . collagenase (SANTYL) ointment  . cyanocobalamin (,VITAMIN B-12,) 1000 MCG/ML injection  . fentaNYL (DURAGESIC - DOSED MCG/HR) 50 MCG/HR  . ferrous sulfate 325 (65 FE) MG EC tablet  . fluticasone (CUTIVATE) 1.22 % cream  . folic acid (FOLVITE) 1 MG tablet  . HUMALOG KWIKPEN 100 UNIT/ML KiwkPen  . HYDROmorphone (DILAUDID) 2 MG tablet  . Insulin Glargine (BASAGLAR KWIKPEN) 100 UNIT/ML SOPN  . isosorbide mononitrate (IMDUR) 120 MG 24 hr tablet  . ketoconazole (NIZORAL) 2 % cream  . lisinopril (PRINIVIL,ZESTRIL) 20 MG tablet  . metFORMIN (GLUCOPHAGE) 1000 MG tablet  . metoCLOPramide (REGLAN) 10 MG tablet  . Multiple Vitamin (MULTIVITAMIN WITH MINERALS) TABS tablet  . NEEDLE, REUSABLE, 22 G 22G X 1-1/2" MISC  . nitroGLYCERIN (NITROSTAT) 0.4 MG  SL tablet  . omega-3 acid ethyl esters (LOVAZA) 1 G capsule  . ondansetron (ZOFRAN-ODT) 4 MG disintegrating tablet  . oxyCODONE-acetaminophen (PERCOCET/ROXICET) 5-325 MG tablet  . pantoprazole (PROTONIX) 40 MG tablet  . pregabalin (LYRICA) 100 MG capsule  . Probiotic CAPS  . Propylene Glycol (SYSTANE BALANCE OP)  . rOPINIRole (REQUIP) 2 MG tablet  . Syringe/Needle, Disp, (SYRINGE 3CC/22GX1-1/2") 22G X 1-1/2" 3 ML MISC  . Tamsulosin HCl (FLOMAX) 0.4 MG CAPS  . torsemide (DEMADEX) 20 MG tablet   No current facility-administered medications for this encounter.      Wynonia Musty [facility] Short Stay Center/Anesthesiology Phone [phone] 05/16/2018 1:28 PM

## 2018-05-20 MED ORDER — BUPIVACAINE LIPOSOME 1.3 % IJ SUSP
20.0000 mL | Freq: Once | INTRAMUSCULAR | Status: AC
  Administered 2018-05-21: 20 mL
  Filled 2018-05-20: qty 20

## 2018-05-21 ENCOUNTER — Encounter (HOSPITAL_COMMUNITY): Payer: Self-pay | Admitting: *Deleted

## 2018-05-21 ENCOUNTER — Inpatient Hospital Stay (HOSPITAL_COMMUNITY): Payer: 59 | Admitting: Physician Assistant

## 2018-05-21 ENCOUNTER — Inpatient Hospital Stay (HOSPITAL_COMMUNITY): Payer: 59 | Admitting: Anesthesiology

## 2018-05-21 ENCOUNTER — Inpatient Hospital Stay (HOSPITAL_COMMUNITY)
Admission: RE | Admit: 2018-05-21 | Discharge: 2018-05-27 | DRG: 330 | Disposition: A | Discharge status: Home / Self Care | Payer: 59 | Attending: Surgery | Admitting: Surgery

## 2018-05-21 ENCOUNTER — Other Ambulatory Visit: Payer: Self-pay

## 2018-05-21 ENCOUNTER — Encounter (HOSPITAL_COMMUNITY)
Admission: RE | Disposition: A | Discharge status: Home / Self Care | Payer: Self-pay | Source: Home / Self Care | Attending: Surgery

## 2018-05-21 DIAGNOSIS — G4733 Obstructive sleep apnea (adult) (pediatric): Secondary | ICD-10-CM | POA: Diagnosis present

## 2018-05-21 DIAGNOSIS — E875 Hyperkalemia: Secondary | ICD-10-CM | POA: Diagnosis present

## 2018-05-21 DIAGNOSIS — F17210 Nicotine dependence, cigarettes, uncomplicated: Secondary | ICD-10-CM | POA: Diagnosis present

## 2018-05-21 DIAGNOSIS — Z432 Encounter for attention to ileostomy: Principal | ICD-10-CM

## 2018-05-21 DIAGNOSIS — E785 Hyperlipidemia, unspecified: Secondary | ICD-10-CM | POA: Diagnosis present

## 2018-05-21 DIAGNOSIS — Z79899 Other long term (current) drug therapy: Secondary | ICD-10-CM

## 2018-05-21 DIAGNOSIS — I2510 Atherosclerotic heart disease of native coronary artery without angina pectoris: Secondary | ICD-10-CM | POA: Diagnosis present

## 2018-05-21 DIAGNOSIS — I5032 Chronic diastolic (congestive) heart failure: Secondary | ICD-10-CM | POA: Diagnosis not present

## 2018-05-21 DIAGNOSIS — Z833 Family history of diabetes mellitus: Secondary | ICD-10-CM | POA: Diagnosis not present

## 2018-05-21 DIAGNOSIS — Z8349 Family history of other endocrine, nutritional and metabolic diseases: Secondary | ICD-10-CM

## 2018-05-21 DIAGNOSIS — Z932 Ileostomy status: Secondary | ICD-10-CM | POA: Diagnosis not present

## 2018-05-21 DIAGNOSIS — E1151 Type 2 diabetes mellitus with diabetic peripheral angiopathy without gangrene: Secondary | ICD-10-CM | POA: Diagnosis present

## 2018-05-21 DIAGNOSIS — Z8249 Family history of ischemic heart disease and other diseases of the circulatory system: Secondary | ICD-10-CM | POA: Diagnosis not present

## 2018-05-21 DIAGNOSIS — N179 Acute kidney failure, unspecified: Secondary | ICD-10-CM | POA: Diagnosis not present

## 2018-05-21 DIAGNOSIS — Z7982 Long term (current) use of aspirin: Secondary | ICD-10-CM

## 2018-05-21 DIAGNOSIS — Z7902 Long term (current) use of antithrombotics/antiplatelets: Secondary | ICD-10-CM

## 2018-05-21 DIAGNOSIS — Z91041 Radiographic dye allergy status: Secondary | ICD-10-CM | POA: Diagnosis not present

## 2018-05-21 DIAGNOSIS — Z794 Long term (current) use of insulin: Secondary | ICD-10-CM

## 2018-05-21 DIAGNOSIS — I130 Hypertensive heart and chronic kidney disease with heart failure and stage 1 through stage 4 chronic kidney disease, or unspecified chronic kidney disease: Secondary | ICD-10-CM | POA: Diagnosis present

## 2018-05-21 DIAGNOSIS — Z9889 Other specified postprocedural states: Secondary | ICD-10-CM

## 2018-05-21 DIAGNOSIS — K760 Fatty (change of) liver, not elsewhere classified: Secondary | ICD-10-CM | POA: Diagnosis present

## 2018-05-21 DIAGNOSIS — K219 Gastro-esophageal reflux disease without esophagitis: Secondary | ICD-10-CM | POA: Diagnosis present

## 2018-05-21 DIAGNOSIS — Z9109 Other allergy status, other than to drugs and biological substances: Secondary | ICD-10-CM

## 2018-05-21 DIAGNOSIS — K2270 Barrett's esophagus without dysplasia: Secondary | ICD-10-CM | POA: Diagnosis present

## 2018-05-21 DIAGNOSIS — N183 Chronic kidney disease, stage 3 (moderate): Secondary | ICD-10-CM | POA: Diagnosis present

## 2018-05-21 DIAGNOSIS — E1122 Type 2 diabetes mellitus with diabetic chronic kidney disease: Secondary | ICD-10-CM | POA: Diagnosis present

## 2018-05-21 DIAGNOSIS — J449 Chronic obstructive pulmonary disease, unspecified: Secondary | ICD-10-CM | POA: Diagnosis present

## 2018-05-21 DIAGNOSIS — K5730 Diverticulosis of large intestine without perforation or abscess without bleeding: Secondary | ICD-10-CM | POA: Diagnosis present

## 2018-05-21 HISTORY — PX: COLOSTOMY REVERSAL: SHX5782

## 2018-05-21 HISTORY — DX: Other specified postprocedural states: Z98.890

## 2018-05-21 HISTORY — PX: ILEOSTOMY CLOSURE: SHX1784

## 2018-05-21 LAB — GLUCOSE, CAPILLARY
Glucose-Capillary: 179 mg/dL — ABNORMAL HIGH (ref 70–99)
Glucose-Capillary: 115 mg/dL — ABNORMAL HIGH (ref 70–99)
Glucose-Capillary: 158 mg/dL — ABNORMAL HIGH (ref 70–99)
Glucose-Capillary: 200 mg/dL — ABNORMAL HIGH (ref 70–99)

## 2018-05-21 SURGERY — CLOSURE, ILEOSTOMY
Anesthesia: General | Site: Abdomen

## 2018-05-21 MED ORDER — PROPOFOL 10 MG/ML IV BOLUS
INTRAVENOUS | Status: DC | PRN
  Administered 2018-05-21: 150 mg via INTRAVENOUS

## 2018-05-21 MED ORDER — ACETAMINOPHEN 500 MG PO TABS
1000.0000 mg | ORAL_TABLET | ORAL | Status: AC
  Administered 2018-05-21: 1000 mg via ORAL
  Filled 2018-05-21: qty 2

## 2018-05-21 MED ORDER — MIDAZOLAM HCL 2 MG/2ML IJ SOLN
INTRAMUSCULAR | Status: AC
  Filled 2018-05-21: qty 2

## 2018-05-21 MED ORDER — METRONIDAZOLE IN NACL 5-0.79 MG/ML-% IV SOLN
500.0000 mg | Freq: Three times a day (TID) | INTRAVENOUS | Status: AC
  Administered 2018-05-21: 500 mg via INTRAVENOUS
  Filled 2018-05-21: qty 100

## 2018-05-21 MED ORDER — PREGABALIN 50 MG PO CAPS
100.0000 mg | ORAL_CAPSULE | Freq: Three times a day (TID) | ORAL | Status: DC
  Administered 2018-05-21 – 2018-05-27 (×17): 100 mg via ORAL
  Filled 2018-05-21 (×19): qty 2

## 2018-05-21 MED ORDER — ROCURONIUM BROMIDE 50 MG/5ML IV SOSY
PREFILLED_SYRINGE | INTRAVENOUS | Status: AC
  Filled 2018-05-21: qty 10

## 2018-05-21 MED ORDER — EPHEDRINE SULFATE-NACL 50-0.9 MG/10ML-% IV SOSY
PREFILLED_SYRINGE | INTRAVENOUS | Status: DC | PRN
  Administered 2018-05-21: 10 mg via INTRAVENOUS

## 2018-05-21 MED ORDER — LIDOCAINE 2% (20 MG/ML) 5 ML SYRINGE
INTRAMUSCULAR | Status: AC
  Filled 2018-05-21: qty 5

## 2018-05-21 MED ORDER — ROCURONIUM BROMIDE 50 MG/5ML IV SOSY
PREFILLED_SYRINGE | INTRAVENOUS | Status: DC | PRN
  Administered 2018-05-21: 50 mg via INTRAVENOUS
  Administered 2018-05-21: 20 mg via INTRAVENOUS
  Administered 2018-05-21: 10 mg via INTRAVENOUS

## 2018-05-21 MED ORDER — TAMSULOSIN HCL 0.4 MG PO CAPS
0.4000 mg | ORAL_CAPSULE | Freq: Every day | ORAL | Status: DC
  Administered 2018-05-21 – 2018-05-27 (×7): 0.4 mg via ORAL
  Filled 2018-05-21 (×8): qty 1

## 2018-05-21 MED ORDER — INSULIN ASPART 100 UNIT/ML ~~LOC~~ SOLN
0.0000 [IU] | Freq: Every day | SUBCUTANEOUS | Status: DC
  Administered 2018-05-23 – 2018-05-25 (×2): 2 [IU] via SUBCUTANEOUS

## 2018-05-21 MED ORDER — ONDANSETRON HCL 4 MG PO TABS: 4.0000 mg | ORAL_TABLET | Freq: Four times a day (QID) | ORAL | Status: DC | PRN

## 2018-05-21 MED ORDER — FENTANYL CITRATE (PF) 100 MCG/2ML IJ SOLN
25.0000 ug | INTRAMUSCULAR | Status: DC | PRN
  Administered 2018-05-21: 50 ug via INTRAVENOUS
  Administered 2018-05-21 (×2): 25 ug via INTRAVENOUS
  Administered 2018-05-21: 50 ug via INTRAVENOUS

## 2018-05-21 MED ORDER — OXYCODONE HCL 5 MG/5ML PO SOLN: 5.0000 mg | Freq: Once | ORAL | Status: DC | PRN

## 2018-05-21 MED ORDER — ALUM & MAG HYDROXIDE-SIMETH 200-200-20 MG/5ML PO SUSP
30.0000 mL | Freq: Four times a day (QID) | ORAL | Status: DC | PRN
  Administered 2018-05-24: 30 mL via ORAL
  Filled 2018-05-21: qty 30

## 2018-05-21 MED ORDER — ONDANSETRON HCL 4 MG/2ML IJ SOLN
INTRAMUSCULAR | Status: DC | PRN
  Administered 2018-05-21: 4 mg via INTRAVENOUS

## 2018-05-21 MED ORDER — ACETAMINOPHEN 500 MG PO TABS
1000.0000 mg | ORAL_TABLET | Freq: Four times a day (QID) | ORAL | Status: DC
  Administered 2018-05-21 – 2018-05-26 (×14): 1000 mg via ORAL
  Filled 2018-05-21 (×15): qty 2

## 2018-05-21 MED ORDER — PANTOPRAZOLE SODIUM 40 MG PO TBEC
40.0000 mg | DELAYED_RELEASE_TABLET | Freq: Every day | ORAL | Status: DC
  Administered 2018-05-22 – 2018-05-27 (×6): 40 mg via ORAL
  Filled 2018-05-21 (×6): qty 1

## 2018-05-21 MED ORDER — METHOCARBAMOL 1000 MG/10ML IJ SOLN
500.0000 mg | Freq: Four times a day (QID) | INTRAVENOUS | Status: DC | PRN
  Filled 2018-05-21: qty 5

## 2018-05-21 MED ORDER — FENTANYL CITRATE (PF) 100 MCG/2ML IJ SOLN
INTRAMUSCULAR | Status: DC | PRN
  Administered 2018-05-21 (×2): 50 ug via INTRAVENOUS

## 2018-05-21 MED ORDER — DEXAMETHASONE SODIUM PHOSPHATE 10 MG/ML IJ SOLN
INTRAMUSCULAR | Status: DC | PRN
  Administered 2018-05-21: 8 mg via INTRAVENOUS

## 2018-05-21 MED ORDER — TRAMADOL HCL 50 MG PO TABS: 50.0000 mg | ORAL_TABLET | Freq: Four times a day (QID) | ORAL | Status: DC | PRN

## 2018-05-21 MED ORDER — FENTANYL CITRATE (PF) 100 MCG/2ML IJ SOLN
INTRAMUSCULAR | Status: AC
  Filled 2018-05-21: qty 2

## 2018-05-21 MED ORDER — ROPINIROLE HCL 1 MG PO TABS
2.0000 mg | ORAL_TABLET | Freq: Every day | ORAL | Status: DC
  Administered 2018-05-21 – 2018-05-26 (×5): 2 mg via ORAL
  Filled 2018-05-21 (×6): qty 2

## 2018-05-21 MED ORDER — DIPHENHYDRAMINE HCL 12.5 MG/5ML PO ELIX: 12.5000 mg | ORAL_SOLUTION | Freq: Four times a day (QID) | ORAL | Status: DC | PRN

## 2018-05-21 MED ORDER — GABAPENTIN 300 MG PO CAPS
300.0000 mg | ORAL_CAPSULE | ORAL | Status: AC
  Administered 2018-05-21: 300 mg via ORAL
  Filled 2018-05-21: qty 1

## 2018-05-21 MED ORDER — BASAGLAR KWIKPEN 100 UNIT/ML ~~LOC~~ SOPN: 60.0000 [IU] | PEN_INJECTOR | Freq: Every day | SUBCUTANEOUS | Status: DC

## 2018-05-21 MED ORDER — ATORVASTATIN CALCIUM 40 MG PO TABS
40.0000 mg | ORAL_TABLET | Freq: Every day | ORAL | Status: DC
  Administered 2018-05-21 – 2018-05-27 (×7): 40 mg via ORAL
  Filled 2018-05-21 (×7): qty 1

## 2018-05-21 MED ORDER — FENTANYL CITRATE (PF) 100 MCG/2ML IJ SOLN: 25.0000 ug | INTRAMUSCULAR | Status: DC | PRN

## 2018-05-21 MED ORDER — INSULIN GLARGINE 100 UNIT/ML ~~LOC~~ SOLN
30.0000 [IU] | Freq: Every day | SUBCUTANEOUS | Status: DC
  Administered 2018-05-21: 30 [IU] via SUBCUTANEOUS
  Filled 2018-05-21 (×2): qty 0.3

## 2018-05-21 MED ORDER — CARVEDILOL 25 MG PO TABS
37.5000 mg | ORAL_TABLET | Freq: Two times a day (BID) | ORAL | Status: DC
  Administered 2018-05-21 – 2018-05-27 (×11): 37.5 mg via ORAL
  Filled 2018-05-21 (×13): qty 1

## 2018-05-21 MED ORDER — BUPIVACAINE LIPOSOME 1.3 % IJ SUSP
20.0000 mL | INTRAMUSCULAR | Status: DC
  Filled 2018-05-21: qty 20

## 2018-05-21 MED ORDER — ONDANSETRON HCL 4 MG/2ML IJ SOLN
INTRAMUSCULAR | Status: AC
  Filled 2018-05-21: qty 2

## 2018-05-21 MED ORDER — SACCHAROMYCES BOULARDII 250 MG PO CAPS
250.0000 mg | ORAL_CAPSULE | Freq: Two times a day (BID) | ORAL | Status: DC
  Administered 2018-05-21 – 2018-05-27 (×11): 250 mg via ORAL
  Filled 2018-05-21 (×12): qty 1

## 2018-05-21 MED ORDER — DEXAMETHASONE SODIUM PHOSPHATE 10 MG/ML IJ SOLN
INTRAMUSCULAR | Status: AC
  Filled 2018-05-21: qty 1

## 2018-05-21 MED ORDER — ASPIRIN EC 81 MG PO TBEC
81.0000 mg | DELAYED_RELEASE_TABLET | Freq: Every day | ORAL | Status: DC
  Administered 2018-05-21 – 2018-05-27 (×7): 81 mg via ORAL
  Filled 2018-05-21 (×7): qty 1

## 2018-05-21 MED ORDER — LIDOCAINE 2% (20 MG/ML) 5 ML SYRINGE
INTRAMUSCULAR | Status: DC | PRN
  Administered 2018-05-21: 80 mg via INTRAVENOUS

## 2018-05-21 MED ORDER — CEFAZOLIN SODIUM-DEXTROSE 2-4 GM/100ML-% IV SOLN
2.0000 g | INTRAVENOUS | Status: DC
  Filled 2018-05-21: qty 100

## 2018-05-21 MED ORDER — DIPHENHYDRAMINE HCL 50 MG/ML IJ SOLN: 12.5000 mg | Freq: Four times a day (QID) | INTRAMUSCULAR | Status: DC | PRN

## 2018-05-21 MED ORDER — PROPOFOL 10 MG/ML IV BOLUS
INTRAVENOUS | Status: AC
  Filled 2018-05-21: qty 20

## 2018-05-21 MED ORDER — EPHEDRINE 5 MG/ML INJ
INTRAVENOUS | Status: AC
  Filled 2018-05-21: qty 10

## 2018-05-21 MED ORDER — HYDRALAZINE HCL 20 MG/ML IJ SOLN: 10.0000 mg | INTRAMUSCULAR | Status: DC | PRN

## 2018-05-21 MED ORDER — ISOSORBIDE MONONITRATE ER 60 MG PO TB24
120.0000 mg | ORAL_TABLET | Freq: Every day | ORAL | Status: DC
  Administered 2018-05-21 – 2018-05-27 (×7): 120 mg via ORAL
  Filled 2018-05-21 (×8): qty 2

## 2018-05-21 MED ORDER — SODIUM CHLORIDE 0.9 % IV SOLN
INTRAVENOUS | Status: DC | PRN
  Administered 2018-05-21: 25 ug/min via INTRAVENOUS

## 2018-05-21 MED ORDER — CEFAZOLIN SODIUM-DEXTROSE 2-4 GM/100ML-% IV SOLN
2.0000 g | Freq: Three times a day (TID) | INTRAVENOUS | Status: AC
  Administered 2018-05-21: 2 g via INTRAVENOUS
  Filled 2018-05-21: qty 100

## 2018-05-21 MED ORDER — INSULIN ASPART 100 UNIT/ML ~~LOC~~ SOLN
0.0000 [IU] | Freq: Three times a day (TID) | SUBCUTANEOUS | Status: DC
  Administered 2018-05-21: 3 [IU] via SUBCUTANEOUS
  Administered 2018-05-22: 5 [IU] via SUBCUTANEOUS
  Administered 2018-05-22: 2 [IU] via SUBCUTANEOUS
  Administered 2018-05-22: 8 [IU] via SUBCUTANEOUS
  Administered 2018-05-23: 5 [IU] via SUBCUTANEOUS
  Administered 2018-05-25: 15 [IU] via SUBCUTANEOUS
  Administered 2018-05-26: 2 [IU] via SUBCUTANEOUS
  Administered 2018-05-27: 5 [IU] via SUBCUTANEOUS

## 2018-05-21 MED ORDER — BASAGLAR KWIKPEN 100 UNIT/ML ~~LOC~~ SOPN
30.0000 [IU] | PEN_INJECTOR | Freq: Every day | SUBCUTANEOUS | Status: DC
  Filled 2018-05-21: qty 3

## 2018-05-21 MED ORDER — OXYCODONE HCL 5 MG PO TABS: 5.0000 mg | ORAL_TABLET | Freq: Once | ORAL | Status: DC | PRN

## 2018-05-21 MED ORDER — SUGAMMADEX SODIUM 200 MG/2ML IV SOLN
INTRAVENOUS | Status: DC | PRN
  Administered 2018-05-21: 300 mg via INTRAVENOUS

## 2018-05-21 MED ORDER — METOPROLOL TARTRATE 5 MG/5ML IV SOLN: 5.0000 mg | Freq: Four times a day (QID) | INTRAVENOUS | Status: DC | PRN

## 2018-05-21 MED ORDER — FENTANYL CITRATE (PF) 250 MCG/5ML IJ SOLN
INTRAMUSCULAR | Status: AC
  Filled 2018-05-21: qty 5

## 2018-05-21 MED ORDER — BISACODYL 10 MG RE SUPP: 10.0000 mg | Freq: Every day | RECTAL | Status: DC | PRN

## 2018-05-21 MED ORDER — HYDROMORPHONE HCL 1 MG/ML IJ SOLN
0.5000 mg | INTRAMUSCULAR | Status: DC | PRN
  Administered 2018-05-21 – 2018-05-26 (×7): 1 mg via INTRAVENOUS
  Filled 2018-05-21 (×7): qty 1

## 2018-05-21 MED ORDER — SODIUM CHLORIDE 0.9 % IV SOLN
INTRAVENOUS | Status: DC
  Administered 2018-05-21: 18:00:00 via INTRAVENOUS

## 2018-05-21 MED ORDER — FENTANYL 50 MCG/HR TD PT72
50.0000 ug | MEDICATED_PATCH | TRANSDERMAL | Status: DC
  Administered 2018-05-21: 50 ug via TRANSDERMAL
  Filled 2018-05-21: qty 1

## 2018-05-21 MED ORDER — ONDANSETRON HCL 4 MG/2ML IJ SOLN
4.0000 mg | Freq: Four times a day (QID) | INTRAMUSCULAR | Status: DC | PRN
  Administered 2018-05-24: 4 mg via INTRAVENOUS
  Filled 2018-05-21: qty 2

## 2018-05-21 MED ORDER — OXYCODONE HCL 5 MG PO TABS
5.0000 mg | ORAL_TABLET | ORAL | Status: DC | PRN
  Administered 2018-05-21 – 2018-05-26 (×15): 10 mg via ORAL
  Filled 2018-05-21 (×16): qty 2

## 2018-05-21 MED ORDER — METOCLOPRAMIDE HCL 5 MG/ML IJ SOLN: 10.0000 mg | Freq: Four times a day (QID) | INTRAMUSCULAR | Status: DC | PRN

## 2018-05-21 MED ORDER — OXYCODONE-ACETAMINOPHEN 5-325 MG PO TABS: 1.0000 | ORAL_TABLET | Freq: Three times a day (TID) | ORAL | Status: DC

## 2018-05-21 MED ORDER — ENSURE SURGERY PO LIQD
237.0000 mL | Freq: Two times a day (BID) | ORAL | Status: DC
  Administered 2018-05-22 – 2018-05-27 (×9): 237 mL via ORAL
  Filled 2018-05-21 (×12): qty 237

## 2018-05-21 MED ORDER — DOCUSATE SODIUM 100 MG PO CAPS
100.0000 mg | ORAL_CAPSULE | Freq: Two times a day (BID) | ORAL | Status: DC
  Administered 2018-05-22: 100 mg via ORAL
  Filled 2018-05-21 (×3): qty 1

## 2018-05-21 MED ORDER — LACTATED RINGERS IV SOLN
INTRAVENOUS | Status: DC
  Administered 2018-05-21: 10:00:00 via INTRAVENOUS

## 2018-05-21 MED ORDER — HEPARIN SODIUM (PORCINE) 5000 UNIT/ML IJ SOLN
5000.0000 [IU] | Freq: Three times a day (TID) | INTRAMUSCULAR | Status: DC
  Administered 2018-05-21 – 2018-05-27 (×15): 5000 [IU] via SUBCUTANEOUS
  Filled 2018-05-21 (×16): qty 1

## 2018-05-21 MED ORDER — 0.9 % SODIUM CHLORIDE (POUR BTL) OPTIME
TOPICAL | Status: DC | PRN
  Administered 2018-05-21: 2000 mL

## 2018-05-21 MED ORDER — MIDAZOLAM HCL 5 MG/5ML IJ SOLN
INTRAMUSCULAR | Status: DC | PRN
  Administered 2018-05-21: 1 mg via INTRAVENOUS

## 2018-05-21 SURGICAL SUPPLY — 55 items
BLADE CLIPPER SURG (BLADE) IMPLANT
CANISTER SUCT 3000ML PPV (MISCELLANEOUS) ×2 IMPLANT
CHLORAPREP W/TINT 26ML (MISCELLANEOUS) ×2 IMPLANT
COVER MAYO STAND STRL (DRAPES) ×1 IMPLANT
COVER SURGICAL LIGHT HANDLE (MISCELLANEOUS) ×3 IMPLANT
DRAPE LAPAROSCOPIC ABDOMINAL (DRAPES) ×2 IMPLANT
DRAPE WARM FLUID 44X44 (DRAPE) ×2 IMPLANT
DRSG OPSITE POSTOP 3X4 (GAUZE/BANDAGES/DRESSINGS) ×1 IMPLANT
DRSG OPSITE POSTOP 4X10 (GAUZE/BANDAGES/DRESSINGS) IMPLANT
DRSG OPSITE POSTOP 4X8 (GAUZE/BANDAGES/DRESSINGS) IMPLANT
DRSG TELFA 3X8 NADH (GAUZE/BANDAGES/DRESSINGS) ×2 IMPLANT
ELECT BLADE 6.5 EXT (BLADE) IMPLANT
ELECT CAUTERY BLADE 6.4 (BLADE) ×3 IMPLANT
ELECT REM PT RETURN 9FT ADLT (ELECTROSURGICAL) ×2
ELECTRODE REM PT RTRN 9FT ADLT (ELECTROSURGICAL) ×1 IMPLANT
GLOVE BIO SURGEON STRL SZ 6 (GLOVE) ×2 IMPLANT
GLOVE BIOGEL PI IND STRL 6.5 (GLOVE) IMPLANT
GLOVE BIOGEL PI IND STRL 7.0 (GLOVE) IMPLANT
GLOVE BIOGEL PI INDICATOR 6.5 (GLOVE) ×1
GLOVE BIOGEL PI INDICATOR 7.0 (GLOVE) ×1
GLOVE INDICATOR 6.5 STRL GRN (GLOVE) ×2 IMPLANT
GLOVE SURG SS PI 6.5 STRL IVOR (GLOVE) ×2 IMPLANT
GOWN STRL REUS W/ TWL LRG LVL3 (GOWN DISPOSABLE) ×2 IMPLANT
GOWN STRL REUS W/TWL LRG LVL3 (GOWN DISPOSABLE) ×6
HANDLE SUCTION POOLE (INSTRUMENTS) IMPLANT
KIT BASIN OR (CUSTOM PROCEDURE TRAY) ×2 IMPLANT
KIT TURNOVER KIT B (KITS) ×2 IMPLANT
LIGASURE IMPACT 36 18CM CVD LR (INSTRUMENTS) IMPLANT
NDL HYPO 25GX1X1/2 BEV (NEEDLE) IMPLANT
NEEDLE HYPO 25GX1X1/2 BEV (NEEDLE) ×2 IMPLANT
NS IRRIG 1000ML POUR BTL (IV SOLUTION) ×4 IMPLANT
PACK GENERAL/GYN (CUSTOM PROCEDURE TRAY) ×2 IMPLANT
PAD ARMBOARD 7.5X6 YLW CONV (MISCELLANEOUS) ×2 IMPLANT
PAD DRESSING TELFA 3X8 NADH (GAUZE/BANDAGES/DRESSINGS) IMPLANT
PENCIL BUTTON HOLSTER BLD 10FT (ELECTRODE) ×1 IMPLANT
SPECIMEN JAR LARGE (MISCELLANEOUS) IMPLANT
SPONGE LAP 18X18 X RAY DECT (DISPOSABLE) IMPLANT
STAPLER GUN LINEAR PROX 60 (STAPLE) ×1 IMPLANT
STAPLER PROXIMATE 75MM BLUE (STAPLE) ×1 IMPLANT
STAPLER VISISTAT 35W (STAPLE) ×2 IMPLANT
SUCTION POOLE HANDLE (INSTRUMENTS) ×2
SUCTION POOLE TIP (SUCTIONS) ×2 IMPLANT
SUT PDS AB 1 TP1 54 (SUTURE) ×2 IMPLANT
SUT PDS AB 1 TP1 96 (SUTURE) ×4 IMPLANT
SUT SILK 2 0 SH (SUTURE) ×1 IMPLANT
SUT SILK 2 0 SH CR/8 (SUTURE) ×2 IMPLANT
SUT SILK 2 0 TIES 10X30 (SUTURE) ×2 IMPLANT
SUT SILK 3 0 SH CR/8 (SUTURE) ×2 IMPLANT
SUT SILK 3 0 TIES 10X30 (SUTURE) ×2 IMPLANT
SUT VIC AB 3-0 SH 18 (SUTURE) IMPLANT
SYR BULB IRRIGATION 50ML (SYRINGE) ×1 IMPLANT
SYR CONTROL 10ML LL (SYRINGE) ×1 IMPLANT
TOWEL OR 17X26 10 PK STRL BLUE (TOWEL DISPOSABLE) ×2 IMPLANT
TRAY FOLEY MTR SLVR 16FR STAT (SET/KITS/TRAYS/PACK) ×1 IMPLANT
TUBE CONNECTING 12X1/4 (SUCTIONS) ×1 IMPLANT

## 2018-05-21 NOTE — Transfer of Care (Signed)
Immediate Anesthesia Transfer of Care Note  Patient: [name]  Procedure(s) Performed: ILEOSTOMY REVERSAL ERAS PATHWAY (N/A Abdomen)  Patient Location: PACU  Anesthesia Type:General  Level of Consciousness: awake, alert  and oriented  Airway & Oxygen Therapy: Patient Spontanous Breathing and Patient connected to nasal cannula oxygen  Post-op Assessment: Report given to RN, Post -op Vital signs reviewed and stable and Patient moving all extremities X 4  Post vital signs: Reviewed and stable  Last Vitals:  Vitals Value Taken Time  BP 139/75 05/21/2018 12:40 PM  Temp    Pulse 55 05/21/2018 12:44 PM  Resp 13 05/21/2018 12:44 PM  SpO2 100 % 05/21/2018 12:44 PM  Vitals shown include unvalidated device data.  Last Pain:  Vitals:   05/21/18 1007  TempSrc: Oral  PainSc:       Patients Stated Pain Goal: 5 (04/88/89 1694)  Complications: No apparent anesthesia complications

## 2018-05-21 NOTE — Progress Notes (Signed)
Orthopedic Tech Progress Note Patient Details:  [name] 04/27/1956 [id number]  Ortho Devices Type of Ortho Device: Abdominal binder Ortho Device/Splint Location: abdomen Ortho Device/Splint Interventions: Loanne Drilling, Breena Bev[place] 05/21/2018, 3:38 PM

## 2018-05-21 NOTE — Anesthesia Preprocedure Evaluation (Addendum)
Anesthesia Evaluation  Patient identified by MRN, date of birth, ID band Patient awake    Reviewed: Allergy & Precautions, NPO status , Patient's Chart, lab work & pertinent test results, reviewed documented beta blocker date and time   History of Anesthesia Complications Negative for: history of anesthetic complications  Airway Mallampati: II  TM Distance: >3 FB Neck ROM: Full    Dental  (+) Dental Advisory Given   Pulmonary sleep apnea , COPD, Current Smoker, former smoker,    breath sounds clear to auscultation       Cardiovascular hypertension, Pt. on home beta blockers and Pt. on medications + angina + CAD, + Peripheral Vascular Disease and +CHF   Rhythm:Regular     Neuro/Psych negative psych ROS   GI/Hepatic hiatal hernia, GERD  Medicated,  Endo/Other  diabetes, Type 2, Insulin Dependent, Oral Hypoglycemic Agents  Renal/GU Renal InsufficiencyRenal disease     Musculoskeletal  (+) Arthritis ,   Abdominal   Peds  Hematology  (+) anemia ,   Anesthesia Other Findings   Reproductive/Obstetrics                            Lab Results  Component Value Date   WBC 6.3 05/15/2018   HGB 10.6 (L) 05/15/2018   HCT 30.7 (L) 05/15/2018   MCV 83.4 05/15/2018   PLT 180 05/15/2018   Lab Results  Component Value Date   CREATININE 1.76 (H) 05/15/2018   BUN 16 05/15/2018   NA 135 05/15/2018   K 3.9 05/15/2018   CL 107 05/15/2018   CO2 19 (L) 05/15/2018   Lab Results  Component Value Date   INR 1.00 01/07/2018   INR 1.05 11/04/2017   INR 1.20 12/16/2015   EKG: normal sinus rhythm.  Echo: - Left ventricle: The cavity size was normal. There was moderate concentric hypertrophy. Systolic function was normal. The estimated ejection fraction was in the range of 55% to 60%. Wall motion was normal; there were no regional wall motion abnormalities. Features are consistent with a pseudonormal  left ventricular filling pattern, with concomitant abnormal relaxation and increased filling pressure (grade 2 diastolic dysfunction). Doppler parameters are consistent with both elevated ventricular end-diastolic filling pressure and elevated left atrial filling pressure. - Mitral valve: Calcified annulus. - Left atrium: The atrium was mildly dilated.  Anesthesia Physical Anesthesia Plan  ASA: III  Anesthesia Plan: General   Post-op Pain Management:    Induction: Intravenous  PONV Risk Score and Plan: 2 and Ondansetron, Dexamethasone and Midazolam  Airway Management Planned: Oral ETT  Additional Equipment: None  Intra-op Plan:   Post-operative Plan: Extubation in OR  Informed Consent:   Plan Discussed with:   Anesthesia Plan Comments:         Anesthesia Quick Evaluation

## 2018-05-21 NOTE — H&P (Signed)
H&P  CC: ilestomy  HPI: Presents for reversal of loop ileostomy. No recent changes to his health. Contrast enema shows normal anastomosis, diffuse colonic diverticulosis.   Allergies  Allergen Reactions  . Ivp Dye [Iodinated Diagnostic Agents] Anaphylaxis and Other (See Comments)    Breathing problems   . Adhesive [Tape] Rash    Rash after 2-3 days use    Past Medical History:  Diagnosis Date  . Barrett's esophagus   . Carotid artery occlusion 11/10/10   LEFT CAROTID ENDARTERECTOMY  . CHF (congestive heart failure) ([place])   . Complication of anesthesia    BP WENT UP AT [facility] "  . COPD (chronic obstructive pulmonary disease) ([place])   . Coronary artery disease   . Diabetes mellitus   . Diverticulitis   . Diverticulosis of colon (without mention of hemorrhage)   . DJD (degenerative joint disease)   . Fatty liver   . Full dentures   . GERD (gastroesophageal reflux disease)   . H/O Doppler ultrasound 06/28/2012   Lower venous duplex left-- findings consistent with acute deep vein thrombosis involving the personal vein of the left lower extremity. No evidence of Baker's cyst on the left. Duplex scan of the left lower extremity arteries reveal adequate blood flow.   . H/O hiatal hernia   . History of cardiovascular stress test 03/23/2011   ejection fraction measured 51%, with an end-diastolic volume of 161 ml and an end-systolic volume of 81 ml. no evidence of myocardial ischemia.  Marland Kitchen History of echocardiogram 08/22/2011   Mildly hypertrophic left ventricle with normal systolic function and mild diastolic dysfunction. mildly dilated left atrium. no significant valvular abnormalities. no pericardial effusion no change from previous study 10/2010. the ejection fraction =>55%  . History of Holter monitoring 02/14/2012   Scan quality is fair due to baseline artifact. Predominant rhythm is Sinus. Veentricular Ectopy was noted in single beat form. The total recording time was 24:00 hours. total  beats =[id number]. the total time analyzed was 23:58 hours. Heart  Max = 102 at 1:18:22, Min= 52 at 9:56:19 am., Avg= 70. There were 0 pause(s) > 2.5 seconds. the longest pause = 0.00 sec. at 1:51 pm(1)  . Hyperlipidemia   . Hypertension   . Neuromuscular disorder ([place])    peripheral neuropathy  . OSA (obstructive sleep apnea) 12/26/2008   took himself off CPAP; AHI of 6.3/hr overall, 32.7/hr during REM sleep and an RDI of 24.8/hr and 50.9/hr.  . Osteomyelitis ([place])    left 5th metatarsal  . PAD (peripheral artery disease) ([place])    Distal aortogram June 2012. Atherectomy left popliteal artery July 2012.   Marland Kitchen Slurred speech    AS PER WIFE IN D/C NOTE 11/10/10  . Substernal chest pain   . Visual color changes    LEFT EYE; BUT NOT TOTAL BLINDNESS  . Wears glasses     Past Surgical History:  Procedure Laterality Date  . AMPUTATION  11/05/2011   Procedure: AMPUTATION RAY;  Surgeon: Wylene Simmer, MD;  Location: [place];  Service: Orthopedics;  Laterality: Right;  Amputation of Right 4&5th Toes  . AMPUTATION Left 11/26/2012   Procedure: AMPUTATION RAY;  Surgeon: Wylene Simmer, MD;  Location: [place];  Service: Orthopedics;  Laterality: Left;  fourth ray amputation  . AMPUTATION Right 08/27/2014   Procedure: Transmetatarsal Amputation;  Surgeon: Newt Minion, MD;  Location: [place];  Service: Orthopedics;  Laterality: Right;  . AMPUTATION Right 01/14/2015   Procedure: AMPUTATION BELOW KNEE;  Surgeon: Beverely Low  Fernanda Drum, MD;  Location: [place];  Service: Orthopedics;  Laterality: Right;  . AMPUTATION Left 10/21/2015   Procedure: Left Foot 5th Ray Amputation;  Surgeon: Newt Minion, MD;  Location: [place];  Service: Orthopedics;  Laterality: Left;  . ANTERIOR FUSION CERVICAL SPINE  02/06/06   C4-5, C5-6, C6-7; SURGEON DR. MAX COHEN  . CARDIAC CATHETERIZATION  10/31/04   2009  . CAROTID ENDARTERECTOMY  11/10/10  . CAROTID ENDARTERECTOMY Left 11/10/2010   Subtotal occlusion of left internal carotid artery with left  hemispheric transient ischemic attacks.  . CARPAL TUNNEL RELEASE Right 10/21/2013   Procedure: RIGHT CARPAL TUNNEL RELEASE;  Surgeon: Wynonia Sours, MD;  Location: [place];  Service: Orthopedics;  Laterality: Right;  . CHOLECYSTECTOMY    . COLON SURGERY    . COLONOSCOPY    . CYSTOSCOPY WITH STENT PLACEMENT Bilateral 01/13/2018   Procedure: CYSTOSCOPY WITH BILATERAL URETERAL CATHETER PLACEMENT;  Surgeon: Ardis Hughs, MD;  Location: [facility];  Service: Urology;  Laterality: Bilateral;  . ESOPHAGEAL MANOMETRY Bilateral 07/19/2014   Procedure: ESOPHAGEAL MANOMETRY (EM);  Surgeon: Jerene Bears, MD;  [facility];  Service: Gastroenterology;  Laterality: Bilateral;  . FEMORAL ARTERY STENT     x6  . FOOT SURGERY  04/25/2016    EXCISION BASE 5TH METATARSAL AND PARTIAL CUBOID LEFT FOOT  . HERNIA REPAIR     LEFT INGUINAL AND UMBILICAL REPAIRS  . I&D EXTREMITY Left 04/25/2016   Procedure: EXCISION BASE 5TH METATARSAL AND PARTIAL CUBOID LEFT FOOT;  Surgeon: Newt Minion, MD;  Location: [place];  Service: Orthopedics;  Laterality: Left;  . ILEOSTOMY  01/13/2018   Procedure: ILEOSTOMY;  Surgeon: Clovis Riley, MD;  Location: [facility];  Service: General;;  . IR RADIOLOGIST EVAL & MGMT  11/19/2017  . IR RADIOLOGIST EVAL & MGMT  12/03/2017  . IR RADIOLOGIST EVAL & MGMT  12/18/2017  . JOINT REPLACEMENT Right 2001   Total  . LAMINECTOMY     X 3 LUMBAR AND X 2 CERVICAL SPINE OPERATIONS  . LAPAROSCOPIC CHOLECYSTECTOMY W/ CHOLANGIOGRAPHY  11/09/04   SURGEON DR. Luella Cook  . LEFT HEART CATHETERIZATION WITH CORONARY ANGIOGRAM N/A 10/29/2014   Procedure: LEFT HEART CATHETERIZATION WITH CORONARY ANGIOGRAM;  Surgeon: Laverda Page, MD;  Location: [facility];  Service: Cardiovascular;  Laterality: N/A;  . LOWER EXTREMITY ANGIOGRAM N/A 03/19/2012   Procedure: LOWER EXTREMITY ANGIOGRAM;  Surgeon: Burnell Blanks, MD;  [facility];  Service: Cardiovascular;  Laterality:  N/A;  . NECK SURGERY    . PARTIAL COLECTOMY N/A 01/13/2018   Procedure: LAPAROSCOPIC ASSISTED   SIGMOID COLECTOMY ILEOSTOMY;  Surgeon: Clovis Riley, MD;  Location: [facility];  Service: General;  Laterality: N/A;  . PENILE PROSTHESIS IMPLANT  08/14/05   INFRAPUBIC INSERTION OF INFLATABLE PENILE PROSTHESIS; SURGEON DR. Amalia Hailey  . PERCUTANEOUS CORONARY STENT INTERVENTION (PCI-S) Right 10/29/2014   Procedure: PERCUTANEOUS CORONARY STENT INTERVENTION (PCI-S);  Surgeon: Laverda Page, MD;  Location: [facility] CATH LAB;  Service: Cardiovascular;  Laterality: Right;  . SHOULDER ARTHROSCOPY    . SPINE SURGERY    . TONSILLECTOMY    . TOTAL KNEE ARTHROPLASTY  07/2002   RIGHT KNEE ; SURGEON  DR. [name] ALSO HAD ARTHROSCOPIC RIGHT KNEE IN  10/2001  . ULNAR NERVE TRANSPOSITION Right 10/21/2013   Procedure: RIGHT ELBOW  ULNAR NERVE DECOMPRESSION;  Surgeon: Wynonia Sours, MD;  Location: [place];  Service: Orthopedics;  Laterality:  Right;    Family History  Problem Relation Age of Onset  . Heart disease Father        Before age 52-  CAD, BPG  . Diabetes Father        Amputation  . Cancer Father        PROSTATE  . Hyperlipidemia Father   . Hypertension Father   . Heart attack Father        Triple BPG  . Varicose Veins Father   . Colon cancer Brother   . Cancer Brother        Colon  . Diabetes Brother   . Heart disease Brother 98       A-Fib. Before age 47  . Hyperlipidemia Brother   . Hypertension Brother   . Cancer Sister        Breast  . Hyperlipidemia Sister   . Hypertension Sister   . Hypertension Son   . Arthritis Unknown        GRANDMOTHER  . Hypertension Unknown        OTHER FAMILY MEMBERS    Social History   Socioeconomic History  . Marital status: Married    Spouse name: Not on file  . Number of children: Not on file  . Years of education: Not on file  . Highest education level: Not on file  Occupational History  . Occupation: Magazine features editor:  UNEMPLOYED  Social Needs  . Financial resource strain: Not on file  . Food insecurity:    Worry: Not on file    Inability: Not on file  . Transportation needs:    Medical: Not on file    Non-medical: Not on file  Tobacco Use  . Smoking status: Current Every Day Smoker    Packs/day: 0.50    Years: 35.00    Pack years: 17.50    Types: E-cigarettes    Last attempt to quit: 10/28/2011    Years since quitting: 6.5  . Smokeless tobacco: Never Used  . Tobacco comment: VAPOR CIGARETTES (Water only)  Substance and Sexual Activity  . Alcohol use: No    Alcohol/week: 0.0 standard drinks    Comment: "not in a long time"  . Drug use: No  . Sexual activity: Yes    Birth control/protection: Implant  Lifestyle  . Physical activity:    Days per week: Not on file    Minutes per session: Not on file  . Stress: Not on file  Relationships  . Social connections:    Talks on phone: Not on file    Gets together: Not on file    Attends religious service: Not on file    Active member of club or organization: Not on file    Attends meetings of clubs or organizations: Not on file    Relationship status: Not on file  Other Topics Concern  . Not on file  Social History Narrative   HEAVY SMOKER AND CONTINUES TO SMOKE 1 PPD. DOES NOT EXERCISE REGULARLY.    Wife [phone] Lebron Quam). Has 2 sons and daughter. Still active             No current facility-administered medications on file prior to encounter.    Current Outpatient Medications on File Prior to Encounter  Medication Sig Dispense Refill  . aspirin EC 81 MG tablet Take 81 mg by mouth daily.    Marland Kitchen atorvastatin (LIPITOR) 40 MG tablet Take 40 mg by mouth daily.    Marland Kitchen  carvedilol (COREG) 25 MG tablet Take 1.5 tablets (37.5 mg total) by mouth 2 (two) times daily. 270 tablet 2  . clobetasol (TEMOVATE) 0.05 % external solution Apply 1 application topically daily as needed (dry scalp).    . clopidogrel (PLAVIX) 75 MG tablet Take 75 mg by  mouth daily.     . collagenase (SANTYL) ointment Apply topically daily. (Patient taking differently: Apply 1 application topically daily as needed (wound care). TO AFFECTED SITE(S)) 15 g 0  . fentaNYL (DURAGESIC - DOSED MCG/HR) 50 MCG/HR Place 1 patch (50 mcg total) onto the skin every 3 (three) days. 3 patch 0  . ferrous sulfate 325 (65 FE) MG EC tablet Take 1 tablet (325 mg total) by mouth 2 (two) times daily. 60 tablet 3  . fluticasone (CUTIVATE) 0.05 % cream Apply 1 application topically 2 (two) times daily as needed (dry skin on face).     . folic acid (FOLVITE) 1 MG tablet Take 1 tablet (1 mg total) by mouth daily. (Patient taking differently: Take 800 mcg by mouth daily. ) 30 tablet 1  . HUMALOG KWIKPEN 100 UNIT/ML KiwkPen Inject 15 Units into the skin 3 (three) times daily with meals. 15 units with breakfast, lunch, & dinner-- pt may increase dose by 1 unit if sugar is elevated  0  . Insulin Glargine (BASAGLAR KWIKPEN) 100 UNIT/ML SOPN Inject 60 Units into the skin at bedtime.  1  . isosorbide mononitrate (IMDUR) 120 MG 24 hr tablet Take 120 mg by mouth daily.    Marland Kitchen ketoconazole (NIZORAL) 2 % cream Apply 1 application topically 2 (two) times daily.  1  . lisinopril (PRINIVIL,ZESTRIL) 20 MG tablet Take 20 mg by mouth daily.   1  . metFORMIN (GLUCOPHAGE) 1000 MG tablet Take 1,000 mg by mouth 2 (two) times daily.  2  . metoCLOPramide (REGLAN) 10 MG tablet Take 1 tablet (10 mg total) by mouth 3 (three) times daily with meals. (Patient taking differently: Take 10 mg by mouth 3 (three) times daily as needed for nausea or vomiting. ) 30 tablet 1  . Multiple Vitamin (MULTIVITAMIN WITH MINERALS) TABS tablet Take 1 tablet by mouth daily.    . nitroGLYCERIN (NITROSTAT) 0.4 MG SL tablet Place 1 tablet (0.4 mg total) under the tongue every 5 (five) minutes as needed (as needed for esophageal spasm). (Patient taking differently: Place 0.4 mg under the tongue every 5 (five) minutes as needed for chest pain. )  30 tablet 1  . omega-3 acid ethyl esters (LOVAZA) 1 G capsule Take 1 g by mouth 2 (two) times daily.     Marland Kitchen oxyCODONE-acetaminophen (PERCOCET/ROXICET) 5-325 MG tablet Take 1 tablet by mouth every 8 (eight) hours as needed. (Patient taking differently: Take 1 tablet by mouth 3 (three) times daily. ) 15 tablet 0  . pantoprazole (PROTONIX) 40 MG tablet Take 1 tablet (40 mg total) by mouth daily before breakfast. 30 tablet 3  . pregabalin (LYRICA) 100 MG capsule Take 1 capsule (100 mg total) by mouth 3 (three) times daily. 30 capsule 0  . Probiotic CAPS Take 1 capsule by mouth daily after breakfast.     . Propylene Glycol (SYSTANE BALANCE OP) Place 2-3 drops into both eyes daily as needed (for dry eyes).    Marland Kitchen rOPINIRole (REQUIP) 2 MG tablet Take 2 mg by mouth at bedtime.     . Syringe/Needle, Disp, (SYRINGE 3CC/22GX1-1/2") 22G X 1-1/2" 3 ML MISC 1 Syringe by Does not apply route as directed. For b12 IM  inj 10 each 0  . Tamsulosin HCl (FLOMAX) 0.4 MG CAPS Take 0.4 mg by mouth daily.    Marland Kitchen torsemide (DEMADEX) 20 MG tablet Take 1 tablet (20 mg total) by mouth every morning. MAY TAKE AN EXTRA 20 MG ONCE A DAY FOR EXCESSIVE FLUID RETENTION -Please resume on 02/17/2018    . HYDROmorphone (DILAUDID) 2 MG tablet Take 1 tablet (2 mg total) by mouth at bedtime. (Patient not taking: Reported on 05/09/2018) 10 tablet 0  . NEEDLE, REUSABLE, 22 G 22G X 1-1/2" MISC 1 Units by Does not apply route as directed. For B12 IM inj 10 each 0  . ondansetron (ZOFRAN-ODT) 4 MG disintegrating tablet Take 4 mg by mouth 4 (four) times daily as needed for nausea.      Review of Systems: a complete, 10pt review of systems was completed with pertinent positives and negatives as documented in the HPI  Physical Exam: Vitals:   05/21/18 1007  BP: (!) 117/49  Pulse: (!) 59  Resp: 18  Temp: 98.4 F (36.9 C)  SpO2: 100%   Gen: A&Ox3, no distress  Head: normocephalic, atraumatic Eyes: extraocular motions intact, anicteric.  Neck:  supple without mass or thyromegaly Chest: unlabored respirations, symmetrical air entry, clear bilaterally   Cardiovascular: RRR  Abdomen: soft, nondistended, nontender. No mass or organomegaly. rlq ileostomy Extremities: warm, without edema, no deformities  Neuro: grossly intact Psych: appropriate mood and affect, normal insight  Skin: warm and dry   CBC Latest Ref Rng & Units 05/15/2018 02/15/2018 02/15/2018  WBC 4.0 - 10.5 K/uL 6.3 - 4.2  Hemoglobin 13.0 - 17.0 g/dL 10.6(L) 8.5(L) 7.5(L)  Hematocrit 39.0 - 52.0 % 30.7(L) 26.0(L) 22.6(L)  Platelets 150 - 400 K/uL 180 - 153    CMP Latest Ref Rng & Units 05/15/2018 02/15/2018 02/14/2018  Glucose 70 - 99 mg/dL 252(H) 115(H) 203(H)  BUN 8 - 23 mg/dL 16 19 25(H)  Creatinine 0.61 - 1.24 mg/dL 1.76(H) 1.50(H) 2.26(H)  Sodium 135 - 145 mmol/L 135 142 138  Potassium 3.5 - 5.1 mmol/L 3.9 4.3 4.9  Chloride 98 - 111 mmol/L 107 113(H) 110  CO2 22 - 32 mmol/L 19(L) 24 22  Calcium 8.9 - 10.3 mg/dL 8.9 8.4(L) 8.7(L)  Total Protein 6.5 - 8.1 g/dL 6.6 - -  Total Bilirubin 0.3 - 1.2 mg/dL 0.8 - -  Alkaline Phos 38 - 126 U/L 139(H) - -  AST 15 - 41 U/L 20 - -  ALT 0 - 44 U/L 19 - -    Lab Results  Component Value Date   INR 1.00 01/07/2018   INR 1.05 11/04/2017   INR 1.20 12/16/2015    Imaging: No results found.   A/P: To OR for ileostomy reversal. Discussed again the procedure including ideally local reversal but possible need for laparotomy, risks including bleeding, infection, pain, scarring, injury to intraabdominal structures, ileus, bowel obstruction, anastomotic leak, fistula, intraabdominal abscess, heart attack, pneumonia, stroke, acute on chronic kidney injury, blood clots, incisional hernia, death. Questions answered to his satisfaction.    Romana Juniper, MD [facility] Surgery, [place] Pager [phone]

## 2018-05-21 NOTE — Progress Notes (Signed)
Orthopedic Tech Progress Note Patient Details:  [name] July 03, 1956 [id number]  Patient ID: [name], male   DOB: 1956-08-04, 62 y.o.   MRN: [id number]   [name] 05/21/2018, 3:40 PM Delete one ortho visit

## 2018-05-21 NOTE — Op Note (Signed)
Operative Note  [name]  [id number]  [id number]  05/21/2018   Surgeon: Victorino Sparrow ConnorMD  Assistant: Fanny Skates MD  Procedure performed: reversal of loop ileostomy  Preop diagnosis: loop ileostomy  Post-op diagnosis/intraop findings: same  Specimens: loop ileostomy Retained items: no EBL: 54MG Complications: none  Description of procedure: After obtaining informed consent the patient was taken to the operating room and placed supine on operating room table wheregeneral endotracheal anesthesia was initiated, preoperative antibiotics were administered, SCDs applied, and a formal timeout was performed. The ileostomy was occluded with a pursestring suture and the abdomen was prepped and draped in the usual sterile fashion. The skin around the stoma was incised and dissection through the subcutaneous tissue performed with cautery. Sharp dissection was then used to free the bowel of somewhat dense adhesions to the fascia until the small bowel could be pulled up. The abdominal cavity felt fairly free of adhesions. After placing stay sutures, enterotomies were made with cautery in the efferent and afferent limb in a region of healthy bowel. The GIA 34mm blue stapler was used to complete the common channel. The stay sutures were removed and the common enterotomy was closed with a TX60 blue load. The loop ileostomy and its mesentery were then divided off and sent for pathology and the stapler was released. 3-0 silk sutures were placed at the apex of the staple line to reduce tension, as well as to imbricate the corner of the common enterotomy closure. A couple more lembert 3-0 silks were placed along the staple line. There was no mesenteric defect. The anastomosis was palpated and was patent, appeared viable and intact. This was reduced into the abdomen. The fascia was closed vertically with interrupted #1PDS sutures. The wound was irrigated and hemostasis ensured. Exparel was infiltrated  within the fascia and subcutaneous tissues. The skin was loosely approximated with staples and telfa wicks placed within the defect. A honeycomb dressing was then applied. The patient was then awakened, extubated and taken to PACU in stable condition.   All counts were correct at the completion of the case.

## 2018-05-21 NOTE — Progress Notes (Signed)
Patient arrived in the Operating Room wearing his shoes from home. They were removed and placed into a clear bag with patient stickers on it. The bag was sent to PACU with the patient once the procedure was over.

## 2018-05-21 NOTE — Anesthesia Procedure Notes (Signed)
Procedure Name: Intubation Date/Time: 05/21/2018 11:00 AM Performed by: Kyung Rudd, CRNA Pre-anesthesia Checklist: Patient identified, Emergency Drugs available, Suction available, Patient being monitored and Timeout performed Patient Re-evaluated:Patient Re-evaluated prior to induction Oxygen Delivery Method: Circle system utilized Preoxygenation: Pre-oxygenation with 100% oxygen Induction Type: IV induction Ventilation: Mask ventilation without difficulty Laryngoscope Size: Mac and 4 Grade View: Grade I Tube type: Oral Tube size: 7.5 mm Number of attempts: 1 Airway Equipment and Method: Stylet Placement Confirmation: ETT inserted through vocal cords under direct vision,  positive ETCO2 and breath sounds checked- equal and bilateral Secured at: 21 cm Tube secured with: Tape Dental Injury: Teeth and Oropharynx as per pre-operative assessment

## 2018-05-21 NOTE — Progress Notes (Signed)
Patient has drainage almost completely covering honeycomb dressing and some abdominal swelling. Notified Dr. Kae Heller who ordered a pressure dressing to be placed instead of the honeycomb and an abdominal binder. These have been placed. Will continue to monitor patient.

## 2018-05-22 ENCOUNTER — Encounter (HOSPITAL_COMMUNITY): Payer: Self-pay | Admitting: Surgery

## 2018-05-22 LAB — GLUCOSE, CAPILLARY
Glucose-Capillary: 122 mg/dL — ABNORMAL HIGH (ref 70–99)
Glucose-Capillary: 249 mg/dL — ABNORMAL HIGH (ref 70–99)
Glucose-Capillary: 216 mg/dL — ABNORMAL HIGH (ref 70–99)
Glucose-Capillary: 277 mg/dL — ABNORMAL HIGH (ref 70–99)
Glucose-Capillary: 188 mg/dL — ABNORMAL HIGH (ref 70–99)

## 2018-05-22 LAB — BASIC METABOLIC PANEL
Anion gap: 7 (ref 5–15)
BUN: 19 mg/dL (ref 8–23)
CO2: 18 mmol/L — ABNORMAL LOW (ref 22–32)
Calcium: 8.3 mg/dL — ABNORMAL LOW (ref 8.9–10.3)
Chloride: 106 mmol/L (ref 98–111)
Creatinine, Ser: 2.07 mg/dL — ABNORMAL HIGH (ref 0.61–1.24)
GFR calc Af Amer: 38 mL/min — ABNORMAL LOW (ref >60)
GFR calc non Af Amer: 32 mL/min — ABNORMAL LOW (ref >60)
Glucose, Bld: 137 mg/dL — ABNORMAL HIGH (ref 70–99)
Potassium: 5.4 mmol/L — ABNORMAL HIGH (ref 3.5–5.1)
Sodium: 131 mmol/L — ABNORMAL LOW (ref 135–145)

## 2018-05-22 LAB — CBC
HCT: 25.8 % — ABNORMAL LOW (ref 39.0–52.0)
Hemoglobin: 8.9 g/dL — ABNORMAL LOW (ref 13.0–17.0)
MCH: 29 pg (ref 26.0–34.0)
MCHC: 34.5 g/dL (ref 30.0–36.0)
MCV: 84 fL (ref 78.0–100.0)
Platelets: 182 10*3/uL (ref 150–400)
RBC: 3.07 MIL/uL — ABNORMAL LOW (ref 4.22–5.81)
RDW: 13 % (ref 11.5–15.5)
WBC: 10.9 10*3/uL — ABNORMAL HIGH (ref 4.0–10.5)

## 2018-05-22 LAB — MRSA PCR SCREENING: MRSA by PCR: POSITIVE — AB

## 2018-05-22 LAB — MAGNESIUM: Magnesium: 1.3 mg/dL — ABNORMAL LOW (ref 1.7–2.4)

## 2018-05-22 MED ORDER — CHLORHEXIDINE GLUCONATE CLOTH 2 % EX PADS
6.0000 | MEDICATED_PAD | Freq: Every day | CUTANEOUS | Status: DC
  Administered 2018-05-22 – 2018-05-24 (×3): 6 via TOPICAL

## 2018-05-22 MED ORDER — INSULIN GLARGINE 100 UNIT/ML ~~LOC~~ SOLN
60.0000 [IU] | Freq: Every day | SUBCUTANEOUS | Status: DC
  Administered 2018-05-22 – 2018-05-23 (×2): 60 [IU] via SUBCUTANEOUS
  Filled 2018-05-22 (×3): qty 0.6

## 2018-05-22 MED ORDER — MUPIROCIN 2 % EX OINT
1.0000 "application " | TOPICAL_OINTMENT | Freq: Two times a day (BID) | CUTANEOUS | Status: AC
  Administered 2018-05-22 – 2018-05-26 (×10): 1 via NASAL
  Filled 2018-05-22 (×2): qty 22

## 2018-05-22 MED ORDER — MAGNESIUM OXIDE 400 (241.3 MG) MG PO TABS
800.0000 mg | ORAL_TABLET | Freq: Two times a day (BID) | ORAL | Status: DC
  Administered 2018-05-22 (×2): 800 mg via ORAL
  Filled 2018-05-22 (×2): qty 2

## 2018-05-22 MED ORDER — MAGNESIUM SULFATE 4 GM/100ML IV SOLN
4.0000 g | Freq: Once | INTRAVENOUS | Status: AC
  Administered 2018-05-22: 4 g via INTRAVENOUS
  Filled 2018-05-22: qty 100

## 2018-05-22 NOTE — Evaluation (Signed)
Occupational Therapy Evaluation Patient Details Name: [name] MRN: [id number] DOB: 1956/05/01 Today's Date: 05/22/2018    History of Present Illness s/p reversal of loop ileostomy. Extensive PMH includes but not limited to: CHF, COPD, R AMPUTATION BELOW KNEE (wears prosthetic), CAD, PAD   Clinical Impression   Pt admitted with the above diagnoses and presents with below problem list. PTA pt was mod I with ADLs, wears a RLE prosthetic and uses a single crutch for functional transfers/mobility. Pt is currently set-supervision level with ADLs. Good recall of ADLs strategies while recovering from abdominal surgery. Pt walking around in room with good toleration. No further acute OT needs indicated. OT signing off.      Follow Up Recommendations  No OT follow up    Equipment Recommendations  None recommended by OT    Recommendations for Other Services       Precautions / Restrictions Precautions Required Braces or Orthoses: (R BKA prosthetic, abdominal binder)      Mobility Bed Mobility               General bed mobility comments: sitting EOB at start and enf od session  Transfers Overall transfer level: Modified independent Equipment used: (1 crutch)             General transfer comment: from EOB and chair    Balance Overall balance assessment: Mild deficits observed, not formally tested                                         ADL either performed or assessed with clinical judgement   ADL Overall ADL's : Needs assistance/impaired Eating/Feeding: Set up;Sitting   Grooming: Supervision/safety;Standing   Upper Body Bathing: Set up;Sitting   Lower Body Bathing: Supervison/ safety;Sit to/from stand   Upper Body Dressing : Set up;Sitting   Lower Body Dressing: Supervision/safety;Sit to/from stand   Toilet Transfer: Supervision/safety   Toileting- Water quality scientist and Hygiene: Supervision/safety   Tub/ Banker:  Supervision/safety   Functional mobility during ADLs: Supervision/safety(single critch) General ADL Comments: Pt completed household distance functional mobility. Well versed in rehab due to several admissions over the past few years.     Vision         Perception     Praxis      Pertinent Vitals/Pain Pain Assessment: Faces Faces Pain Scale: Hurts a little bit Pain Location: abdomen Pain Descriptors / Indicators: Sore Pain Intervention(s): Monitored during session     Hand Dominance     Extremity/Trunk Assessment Upper Extremity Assessment Upper Extremity Assessment: Overall WFL for tasks assessed   Lower Extremity Assessment Lower Extremity Assessment: Defer to PT evaluation   Cervical / Trunk Assessment Cervical / Trunk Assessment: Normal   Communication Communication Communication: No difficulties   Cognition Arousal/Alertness: Awake/alert Behavior During Therapy: WFL for tasks assessed/performed Overall Cognitive Status: Within Functional Limits for tasks assessed                                     General Comments       Exercises     Shoulder Instructions      Home Living Family/patient expects to be discharged to:: Private residence Living Arrangements: Spouse/significant other;Children Available Help at Discharge: Family Type of Home: Mobile home Home Access: Ramped entrance     Home Layout:  One level     Bathroom Shower/Tub: Occupational psychologist: Handicapped height     Home Equipment: Environmental consultant - 4 wheels;Bedside commode;Crutches;Wheelchair - manual;Hand held Tourist information centre manager - 2 wheels   Additional Comments: Rt BKA prosthesis; Lt AFO w/ shoe      Prior Functioning/Environment Level of Independence: Independent with assistive device(s)        Comments: uses crutch and RLE prosthetic        OT Problem List: Pain      OT Treatment/Interventions:      OT Goals(Current goals can be found in the care  plan section) Acute Rehab OT Goals Patient Stated Goal: home  OT Frequency:     Barriers to D/C:            Co-evaluation              AM-PAC PT "6 Clicks" Daily Activity     Outcome Measure Help from another person eating meals?: None Help from another person taking care of personal grooming?: None Help from another person toileting, which includes using toliet, bedpan, or urinal?: None Help from another person bathing (including washing, rinsing, drying)?: None Help from another person to put on and taking off regular upper body clothing?: None Help from another person to put on and taking off regular lower body clothing?: None 6 Click Score: 24   End of Session Equipment Utilized During Treatment: Other (comment)(1 crutch, abdominal binder)  Activity Tolerance: Patient tolerated treatment well Patient left: with call bell/phone within reach;Other (comment)(sitting EOB)  OT Visit Diagnosis: Pain;Other abnormalities of gait and mobility (R26.89)                Time: 1119-1140 OT Time Calculation (min): 21 min Charges:  OT General Charges $OT Visit: 1 Visit OT Evaluation $OT Eval Low Complexity: [place], OT Acute Rehabilitation Services Pager: [phone] Office: [phone]  05/22/2018, 11:56 AM

## 2018-05-22 NOTE — Progress Notes (Signed)
Lab called, Pt is MRSA positive, Pt continue with Contact Precaution, Started on MRSA protocol

## 2018-05-22 NOTE — Progress Notes (Signed)
PT Cancellation Note  Patient Details Name: [name] MRN: [id number] DOB: 1955-09-18   Cancelled Treatment:    Reason Eval/Treat Not Completed: Other (comment). Pt's RN present and performing bedside care, requesting PT to return at another time. PT will continue to follow acutely to perform evaluation when available.    [place] 05/22/2018, 3:51 PM

## 2018-05-22 NOTE — Progress Notes (Signed)
S: Had some drainage from wound in PACU, no further drainage overnight. Pain well controlled. Tolerating liquids without nausea, has had two liquid bowel movements overnight. Has been walking around in the room. Confirms he held both aspirin and plavix preop.   Vitals, labs, intake/output, and orders reviewed at this time. Afebrile, HR 45/75, normo- to hypertensive, sats 100% room air.  PO 2020, UOP 750 (night shift only). K 5.4, Cr 2.07 (recent baseline 1.5-1.7), Mag 1.3, Na 131 (135 preop). WBC 10.9 (6.3), Hgb 8.9 (10.6, though previous baseline around 8.5), Plt 182 (180).  Gen: A&Ox3, no distress, sitting up in bed. H&N: EOMI, atraumatic, neck supple Chest: unlabored respirations, RRR Abd: soft, nontender except around incision, nondistended, incision intact with unchanged dressing saturation compared to line drawn by PACU nurse yesterday afternoon. No erythema, induration or hematoma Ext: warm, no edema Neuro: grossly normal  Lines/tubes/drains: PIV  A/P:  POD 1 reversal of loop ileostomy, doing fairly well so far.  -Advance to soft diet today -Continue ambulation and pulmonary toilet, PT/OT -Hypomagnesemia- replace IV and PO, recheck tomorrow -Acute kidney injury on chronic kidney disease, mild hyperkalemia- urine output is adequate overnight, good PO intake. Avoid any nephrotoxic agents/ nsaids other than baby aspirin. Monitor I&Os strictly- risk of fluid overload with hx CHF and vascular disease. Recheck labs in AM -Chronic pain- continue home regimen with additional oxycodone and dilaudid for breakthrough or severe pain -PVD/CAD/HTN- continue ASA 81, imdur, coreg, lipitor. Resume plavix at discharge -Insulin dependent diabetes- continue sliding scale and will increase lantus to home dose   Romana Juniper, MD [facility] Surgery, [place] Pager [phone]

## 2018-05-23 LAB — CBC
HCT: 24.6 % — ABNORMAL LOW (ref 39.0–52.0)
Hemoglobin: 8.5 g/dL — ABNORMAL LOW (ref 13.0–17.0)
MCH: 29.2 pg (ref 26.0–34.0)
MCHC: 34.6 g/dL (ref 30.0–36.0)
MCV: 84.5 fL (ref 78.0–100.0)
Platelets: 156 10*3/uL (ref 150–400)
RBC: 2.91 MIL/uL — ABNORMAL LOW (ref 4.22–5.81)
RDW: 13.2 % (ref 11.5–15.5)
WBC: 8.2 10*3/uL (ref 4.0–10.5)

## 2018-05-23 LAB — BASIC METABOLIC PANEL
Anion gap: 6 (ref 5–15)
BUN: 27 mg/dL — ABNORMAL HIGH (ref 8–23)
CO2: 22 mmol/L (ref 22–32)
Calcium: 8.3 mg/dL — ABNORMAL LOW (ref 8.9–10.3)
Chloride: 108 mmol/L (ref 98–111)
Creatinine, Ser: 2.33 mg/dL — ABNORMAL HIGH (ref 0.61–1.24)
GFR calc Af Amer: 33 mL/min — ABNORMAL LOW (ref >60)
GFR calc non Af Amer: 28 mL/min — ABNORMAL LOW (ref >60)
Glucose, Bld: 106 mg/dL — ABNORMAL HIGH (ref 70–99)
Potassium: 4.4 mmol/L (ref 3.5–5.1)
Sodium: 136 mmol/L (ref 135–145)

## 2018-05-23 LAB — GLUCOSE, CAPILLARY
Glucose-Capillary: 78 mg/dL (ref 70–99)
Glucose-Capillary: 67 mg/dL — ABNORMAL LOW (ref 70–99)
Glucose-Capillary: 141 mg/dL — ABNORMAL HIGH (ref 70–99)
Glucose-Capillary: 207 mg/dL — ABNORMAL HIGH (ref 70–99)
Glucose-Capillary: 226 mg/dL — ABNORMAL HIGH (ref 70–99)

## 2018-05-23 LAB — MAGNESIUM: Magnesium: 2.1 mg/dL (ref 1.7–2.4)

## 2018-05-23 MED ORDER — INSULIN GLARGINE 100 UNIT/ML ~~LOC~~ SOLN
55.0000 [IU] | Freq: Every day | SUBCUTANEOUS | Status: DC
  Administered 2018-05-24 – 2018-05-26 (×3): 55 [IU] via SUBCUTANEOUS
  Filled 2018-05-23 (×5): qty 0.55

## 2018-05-23 MED ORDER — ALLEVYN SACRUM EX PADS: 1.0000 | MEDICATED_PAD | Freq: Every day | CUTANEOUS | 2 refills | Status: AC

## 2018-05-23 MED ORDER — OXYCODONE HCL 5 MG PO TABS: 5.0000 mg | ORAL_TABLET | Freq: Four times a day (QID) | ORAL | 0 refills | Status: DC | PRN

## 2018-05-23 MED ORDER — "GAUZE DRESSING 4""X4"" PADS": 1.0000 | MEDICATED_PAD | Freq: Every day | 2 refills | Status: DC

## 2018-05-23 MED ORDER — SODIUM CHLORIDE 0.45 % IV SOLN
INTRAVENOUS | Status: DC
  Administered 2018-05-23 – 2018-05-26 (×6): via INTRAVENOUS

## 2018-05-23 MED ORDER — "TEGADERM FILM 4""X4-3/4"" MISC": 1.0000 | Freq: Every day | 2 refills | Status: DC

## 2018-05-23 MED ORDER — DOCUSATE SODIUM 100 MG PO CAPS: 100.0000 mg | ORAL_CAPSULE | Freq: Two times a day (BID) | ORAL | Status: DC | PRN

## 2018-05-23 NOTE — Progress Notes (Addendum)
S: Multiple bowel movements, becoming more formed. No nausea or bloating, tolerating PO. Pain well controlled.   Vitals, labs, intake/output, and orders reviewed at this time. Afebrile, HR 51-64, normo- to hypertensive, sats 99-100% room air. PO 1980, UOP 1200 + 5 voids. BM x 6. WBC 8.2 (10.9), HGB 8.5 (8.9), Plt 156 (182). BMP- K and Mag improved from yesterday, Creatinine uptrending 2.33 (2.07)  Gen: A&Ox3, no distress  H&N: EOMI, atraumatic, neck supple Chest: unlabored respirations, RRR Abd: soft, appropriately tender around wound, nondistended, incision with serosanguinous drainage on dressing from this morning; wicks in place no evidence of infection, most inferior wick removed, no active bleeding or drainage.   Ext: warm, no edema Neuro: grossly normal  Lines/tubes/drains: PIV  A/P:  POD 2 reversal of loop ileostomy -Having bowel movements, tolerating PO. Change colace to PRN -Advance to carb modified diet -Continue ambulation and pulmonary toilet, PT/OT -Hypomagnesemia, hyperkalemia- resolved, stop mag -Acute kidney injury on chronic kidney disease- urine output is adequate overnight, good PO intake. Will restart low-rate 0.45% normal saline. Avoid any nephrotoxic agents/ nsaids other than baby aspirin. Monitor I&Os strictly- risk of fluid overload with hx CHF and vascular disease. Recheck labs in AM.  -Chronic pain- continue home regimen with additional oxycodone and dilaudid for breakthrough or severe pain -PVD/CAD/HTN- continue ASA 81, imdur, coreg, lipitor. Resume plavix at discharge -Insulin dependent diabetes- continue sliding scale and  lantus; Will decrease lantus dose to 55 per diabetes coordinator recs  Anticipate discharge tomorrow if creatinine downtrending.    Romana Juniper, MD [facility] Surgery, [place] Pager [phone]

## 2018-05-23 NOTE — Discharge Instructions (Signed)
SURGERY: POST OP INSTRUCTIONS   EAT Gradually transition to a high fiber diet with a fiber supplement over the next few days after discharge  WALK Walk an hour a day.  Control your pain to do that.    CONTROL PAIN Control pain so that you can walk, sleep, tolerate sneezing/coughing, go up/down stairs.  HAVE A BOWEL MOVEMENT DAILY Keep your bowels regular to avoid problems.  OK to try a laxative to override constipation.  OK to use an antidairrheal to slow down diarrhea.  Call if not better after 2 tries  CALL IF YOU HAVE PROBLEMS/CONCERNS Call if you are still struggling despite following these instructions. Call if you have concerns not answered by these instructions  ######################################################################   DIET Follow a light diet the first few days at home.  Start with a bland diet such as soups, liquids, starchy foods, low fat foods, etc.  If you feel full, bloated, or constipated, stay on a ful liquid or pureed/blenderized diet for a few days until you feel better and no longer constipated. Be sure to drink plenty of fluids every day to avoid getting dehydrated (feeling dizzy, not urinating, etc.). Gradually add a fiber supplement to your diet over the next week.  Gradually get back to a regular solid diet.  Avoid fast food or heavy meals the first week as you are more likely to get nauseated. It is expected for your digestive tract to need a few months to get back to normal.  It is common for your bowel movements and stools to be irregular.  You will have occasional bloating and cramping that should eventually fade away.  Until you are eating solid food normally, off all pain medications, and back to regular activities; your bowels will not be normal. Focus on eating a low-fat, high fiber diet the rest of your life (See Getting to [place], below).  CARE of your INCISION or WOUND It is good for closed incision and even open wounds to be  washed every day.  Shower every day.   Wash the incisions and wounds clean with soap & water.    If you have a closed incision(s), wash the incision with soap & water every day.  You may leave closed incisions open to air if it is dry.   You may cover the incision with clean gauze & replace it after your daily shower for comfort. If you have staples, set up an appointment for them to be removed in the office in 14 days after surgery.  Remove wicks on post-op day 4. Pressure on the dressing for 30 minutes will stop most wound bleeding.  Eventually your body will heal & pull the open wound closed over the next few months.  Raw open wounds will occasionally bleed or secrete yellow drainage until it heals closed.  Even closed incisions can have mild bleeding or drainage the first few days until the skin edges scab over & seal.     ACTIVITIES as tolerated Start light daily activities --- self-care, walking, climbing stairs-- beginning the day after surgery.  Gradually increase activities as tolerated.  Control your pain to be active.  Stop when you are tired.  Ideally, walk several times a day, eventually an hour a day.   Most people are back to most day-to-day activities in a few weeks.  It takes 4-8 weeks to get back to unrestricted, intense activity. If you can walk 30 minutes without difficulty, it is safe to try more  intense activity such as jogging, treadmill, bicycling, low-impact aerobics, swimming, etc. Save the most intensive and strenuous activity for last (Usually 4-8 weeks after surgery) such as sit-ups, heavy lifting, contact sports, etc.  Refrain from any intense heavy lifting or straining until you are off narcotics for pain control.  You will have off days, but things should improve week-by-week. DO NOT PUSH THROUGH PAIN.  Let pain be your guide: If it hurts to do something, don't do it.  Pain is your body warning you to avoid that activity for another week until the pain goes down. You may  drive when you are no longer taking narcotic prescription pain medication, you can comfortably wear a seatbelt, and you can safely make sudden turns/stops to protect yourself without hesitating due to pain. You may have sexual intercourse when it is comfortable. If it hurts to do something, stop.  MEDICATIONS Take your usually prescribed home medications unless otherwise directed.   Blood thinners:  Usually you can restart any strong blood thinners after the second postoperative day.  It is OK to take aspirin right away.     If you are on strong blood thinners (warfarin/Coumadin, Plavix, Xerelto, Eliquis, Pradaxa, etc), discuss with your surgeon, medicine PCP, and/or cardiologist for instructions on when to restart the blood thinner & if blood monitoring is needed (PT/INR blood check, etc).     PAIN CONTROL Pain after surgery or related to activity is often due to strain/injury to muscle, tendon, nerves and/or incisions.  This pain is usually short-term and will improve in a few months.  To help speed the process of healing and to get back to regular activity more quickly, DO THE FOLLOWING THINGS TOGETHER: 1. Increase activity gradually.  DO NOT PUSH THROUGH PAIN 2. Use Ice and/or Heat 3. Try Gentle Massage and/or Stretching 4. Take over the counter pain medication 5. Take Narcotic prescription pain medication for more severe pain  Good pain control = faster recovery.  It is better to take more medicine to be more active than to stay in bed all day to avoid medications. 1.  Increase activity gradually Avoid heavy lifting at first, then increase to lifting as tolerated over the next 6 weeks. Do not push through the pain.  Listen to your body and avoid positions and maneuvers than reproduce the pain.  Wait a few days before trying something more intense Walking an hour a day is encouraged to help your body recover faster and more safely.  Start slowly and stop when getting sore.  If you can  walk 30 minutes without stopping or pain, you can try more intense activity (running, jogging, aerobics, cycling, swimming, treadmill, sex, sports, weightlifting, etc.) Remember: If it hurts to do it, then dont do it! 2. Use Ice and/or Heat You will have swelling and bruising around the incisions.  This will take several weeks to resolve. Ice packs or heating pads (6-8 times a day, 30-60 minutes at a time) will help sooth soreness & bruising. Some people prefer to use ice alone, heat alone, or alternate between ice & heat.  Experiment and see what works best for you.  Consider trying ice for the first few days to help decrease swelling and bruising; then, switch to heat to help relax sore spots and speed recovery. Shower every day.  Short baths are fine.  It feels good!  Keep the incisions and wounds clean with soap & water.   3. Try Gentle Massage and/or Stretching Massage at the  area of pain many times a day Stop if you feel pain - do not overdo it 4. Take over the counter pain medication This helps the muscle and nerve tissues become less irritable and calm down faster Choose ONE of the following over-the-counter anti-inflammatory medications: Acetaminophen 500mg  tabs (Tylenol) 1-2 pills with every meal and just before bedtime (avoid if you have liver problems or if you have acetaminophen in you narcotic prescription) Naproxen 220mg  tabs (ex. Aleve, Naprosyn) 1-2 pills twice a day (avoid if you have kidney, stomach, IBD, or bleeding problems) Ibuprofen 200mg  tabs (ex. Advil, Motrin) 3-4 pills with every meal and just before bedtime (avoid if you have kidney, stomach, IBD, or bleeding problems) Take with food/snack several times a day as directed for at least 2 weeks to help keep pain / soreness down & more manageable. 5. Take Narcotic prescription pain medication for more severe pain A prescription for strong pain control is often given to you upon discharge (for example: oxycodone/Percocet,  hydrocodone/Norco/Vicodin, or tramadol/Ultram) Take your pain medication as prescribed. Be mindful that most narcotic prescriptions contain Tylenol (acetaminophen) as well - avoid taking too much Tylenol. If you are having problems/concerns with the prescription medicine (does not control pain, nausea, vomiting, rash, itching, etc.), please call us [phone] to see if we need to switch you to a different pain medicine that will work better for you and/or control your side effects better. If you need a refill on your pain medication, you must call the office before 4 pm and on weekdays only.  By federal law, prescriptions for narcotics cannot be called into a pharmacy.  They must be filled out on paper & picked up from our office by the patient or authorized caretaker.  Prescriptions cannot be filled after 4 pm nor on weekends.    WHEN TO CALL us [phone] Severe uncontrolled or worsening pain  Fever over 101 F (38.5 C) Concerns with the incision: Worsening pain, redness, rash/hives, swelling, bleeding, or drainage Reactions / problems with new medications (itching, rash, hives, nausea, etc.) Nausea and/or vomiting Difficulty urinating Difficulty breathing Worsening fatigue, dizziness, lightheadedness, blurred vision Other concerns If you are not getting better after two weeks or are noticing you are getting worse, contact our office [phone] for further advice.  We may need to adjust your medications, re-evaluate you in the office, send you to the emergency room, or see what other things we can do to help. The clinic staff is available to answer your questions during regular business hours (8:30am-5pm).  Please dont hesitate to call and ask to speak to one of our nurses for clinical concerns.    A surgeon from [facility] is always on call at the hospitals 24 hours/day If you have a medical emergency, go to the nearest emergency room or call 911.  FOLLOW UP in our  office One the day of your discharge from the hospital (or the next business weekday), please call [facility] to set up or confirm an appointment to see your surgeon in the office for a follow-up appointment.  Usually it is 2-3 weeks after your surgery.   If you have skin staples at your incision(s), let the office know so we can set up a time in the office for the nurse to remove them (usually around 10 days after surgery). Make sure that you call for appointments the day of discharge (or the next business weekday) from the hospital to ensure a convenient appointment  time. IF YOU HAVE DISABILITY OR FAMILY LEAVE FORMS, BRING THEM TO THE OFFICE FOR PROCESSING.  DO NOT GIVE THEM TO YOUR DOCTOR.  [facility] [address], [place], [place], [place]  [address] ? [phone] - Main [phone] - [place],  [phone] - Fax [email or web address]  GETTING TO GOOD BOWEL HEALTH. It is expected for your digestive tract to need a few months to get back to normal.  It is common for your bowel movements and stools to be irregular.  You will have occasional bloating and cramping that should eventually fade away.  Until you are eating solid food normally, off all pain medications, and back to regular activities; your bowels will not be normal.   Avoiding constipation The goal: ONE SOFT BOWEL MOVEMENT A DAY!    Drink plenty of fluids.  Choose water first. TAKE A FIBER SUPPLEMENT EVERY DAY THE REST OF YOUR LIFE During your first week back home, gradually add back a fiber supplement every day Experiment which form you can tolerate.   There are many forms such as powders, tablets, wafers, gummies, etc Psyllium bran (Metamucil), methylcellulose (Citrucel), Miralax or Glycolax, Benefiber, Flax Seed.  Adjust the dose week-by-week (1/2 dose/day to 6 doses a day) until you are moving your bowels 1-2 times a day.  Cut back the dose or try a different fiber  product if it is giving you problems such as diarrhea or bloating. Sometimes a laxative is needed to help jump-start bowels if constipated until the fiber supplement can help regulate your bowels.  If you are tolerating eating & you are farting, it is okay to try a gentle laxative such as double dose MiraLax, prune juice, or Milk of Magnesia.  Avoid using laxatives too often. Stool softeners can sometimes help counteract the constipating effects of narcotic pain medicines.  It can also cause diarrhea, so avoid using for too long. If you are still constipated despite taking fiber daily, eating solids, and a few doses of laxatives, call our office. Controlling diarrhea Try drinking liquids and eating bland foods for a few days to avoid stressing your intestines further. Avoid dairy products (especially milk & ice cream) for a short time.  The intestines often can lose the ability to digest lactose when stressed. Avoid foods that cause gassiness or bloating.  Typical foods include beans and other legumes, cabbage, broccoli, and dairy foods.  Avoid greasy, spicy, fast foods.  Every person has some sensitivity to other foods, so listen to your body and avoid those foods that trigger problems for you. Probiotics (such as active yogurt, Align, etc) may help repopulate the intestines and colon with normal bacteria and calm down a sensitive digestive tract Adding a fiber supplement gradually can help thicken stools by absorbing excess fluid and retrain the intestines to act more normally.  Slowly increase the dose over a few weeks.  Too much fiber too soon can backfire and cause cramping & bloating. It is okay to try and slow down diarrhea with a few doses of antidiarrheal medicines.   Bismuth subsalicylate (ex. Kayopectate, Pepto Bismol) for a few doses can help control diarrhea.  Avoid if pregnant.   Loperamide (Imodium) can slow down diarrhea.  Start with one tablet (2mg ) first.  Avoid if you are having fevers  or severe pain.  ILEOSTOMY PATIENTS WILL HAVE CHRONIC DIARRHEA since their colon is not in use.    Drink plenty of liquids.  You will need to drink even more  glasses of water/liquid a day to avoid getting dehydrated. Record output from your ileostomy.  Expect to empty the bag every 3-4 hours at first.  Most people with a permanent ileostomy empty their bag 4-6 times at the least.   Use antidiarrheal medicine (especially Imodium) several times a day to avoid getting dehydrated.  Start with a dose at bedtime & breakfast.  Adjust up or down as needed.  Increase antidiarrheal medications as directed to avoid emptying the bag more than 8 times a day (every 3 hours). Work with your wound ostomy nurse to learn care for your ostomy.  See ostomy care instructions. TROUBLESHOOTING IRREGULAR BOWELS 1) Start with a soft & bland diet. No spicy, greasy, or fried foods.  2) Avoid gluten/wheat or dairy products from diet to see if symptoms improve. 3) Miralax 17gm or flax seed mixed in [place]. water or juice-daily. May use 2-4 times a day as needed. 4) Gas-X, Phazyme, etc. as needed for gas & bloating.  5) Prilosec (omeprazole) over-the-counter as needed 6)  Consider probiotics (Align, Activa, etc) to help calm the bowels down  Call your doctor if you are getting worse or not getting better.  Sometimes further testing (cultures, endoscopy, X-ray studies, CT scans, bloodwork, etc.) may be needed to help diagnose and treat the cause of the diarrhea. [facility], [place], [place], [place], [place]  [address] [phone] - Main.    [phone]  - Toll Free.   [phone] - Fax [email or web address]

## 2018-05-23 NOTE — Anesthesia Postprocedure Evaluation (Signed)
Anesthesia Post Note  Patient: [name]  Procedure(s) Performed: ILEOSTOMY REVERSAL ERAS PATHWAY (N/A Abdomen)     Patient location during evaluation: PACU Anesthesia Type: General Level of consciousness: awake and alert Pain management: pain level controlled Vital Signs Assessment: post-procedure vital signs reviewed and stable Respiratory status: spontaneous breathing, nonlabored ventilation, respiratory function stable and patient connected to nasal cannula oxygen Cardiovascular status: blood pressure returned to baseline and stable Postop Assessment: no apparent nausea or vomiting Anesthetic complications: no    Last Vitals:  Vitals:   05/22/18 2145 05/23/18 0600  BP: (!) 146/57 136/70  Pulse: 64 (!) 51  Resp: 18 18  Temp: 37 C 36.9 C  SpO2: 99% 99%    Last Pain:  Vitals:   05/23/18 0600  TempSrc: Oral  PainSc:                  [name]

## 2018-05-23 NOTE — Progress Notes (Signed)
Inpatient Diabetes Program Recommendations  AACE/ADA: New Consensus Statement on Inpatient Glycemic Control (2015)  Target Ranges:  Prepandial:   less than 140 mg/dL      Peak postprandial:   less than 180 mg/dL (1-2 hours)      Critically ill patients:  140 - 180 mg/dL   Lab Results  Component Value Date   GLUCAP 78 05/23/2018   HGBA1C 7.7 (H) 05/15/2018    Review of Glycemic Control  Diabetes history: DM 2 Outpatient Diabetes medications: Lantus 60 units Daily, Humalog 15 units tid, Metformin 1000 mg BID Current orders for Inpatient glycemic control: Lantus 60 units Daily, Novolog 0-15 units tid, Novolog 0-5 units qhs  A1c 7.7% on 9/5  Inpatient Diabetes Program Recommendations:    Glucose 78 this am. Consider decreasing Lantus does to 55 units.  Thanks,  Tama Headings RN, MSN, BC-ADM Inpatient Diabetes Coordinator Team Pager [phone] (8a-5p)

## 2018-05-23 NOTE — Evaluation (Signed)
Physical Therapy Evaluation & Discharge Patient Details Name: [name] MRN: [id number] DOB: Feb 26, 1956 Today's Date: 05/23/2018   History of Present Illness  Pt is a 62 y/o male s/p reversal of loop ileostomy. Extensive PMH includes but not limited to: CHF, COPD, R AMPUTATION BELOW KNEE (wears prosthetic), CAD, PAD  Clinical Impression  Pt presented supine in bed with HOB elevated, awake and willing to participate in therapy session. Prior to admission, pt reported that he ambulated with use of a single axillary crutch and his R LE prosthesis. He lives with his spouse in a single level home with a ramped entrance. Pt currently performing all mobility at modified independence. Pt appears to be at his baseline in regards to functional mobility. No further acute PT needs identified at this time. PT signing off.     Follow Up Recommendations No PT follow up    Equipment Recommendations  None recommended by PT    Recommendations for Other Services       Precautions / Restrictions Precautions Precautions: Fall Required Braces or Orthoses: Other Brace/Splint Other Brace/Splint: abdominal binder, R LE prosthesis for ambulation Restrictions Weight Bearing Restrictions: No      Mobility  Bed Mobility Overal bed mobility: Independent                Transfers Overall transfer level: Modified independent Equipment used: Crutches(single crutch)                Ambulation/Gait Ambulation/Gait assistance: Modified independent (Device/Increase time) Gait Distance (Feet): 50 Feet Assistive device: (single crutch on L side) Gait Pattern/deviations: Step-to pattern;Step-through pattern;Decreased step length - right;Decreased step length - left;Decreased stride length Gait velocity: decreased   General Gait Details: pt with an abnormal but steady gait pattern, very likely his baseline with use of R LE prosthesis  Stairs            Wheelchair Mobility    Modified  Rankin (Stroke Patients Only)       Balance Overall balance assessment: Mild deficits observed, not formally tested                                           Pertinent Vitals/Pain Pain Assessment: No/denies pain    Home Living Family/patient expects to be discharged to:: Private residence Living Arrangements: Spouse/significant other;Children Available Help at Discharge: Family Type of Home: Mobile home Home Access: Ramped entrance     Home Layout: One level Home Equipment: Environmental consultant - 4 wheels;Bedside commode;Crutches;Wheelchair - manual;Hand held Tourist information centre manager - 2 wheels Additional Comments: Rt BKA prosthesis; Lt AFO w/ shoe    Prior Function Level of Independence: Independent with assistive device(s)         Comments: uses crutch and RLE prosthetic     Hand Dominance        Extremity/Trunk Assessment   Upper Extremity Assessment Upper Extremity Assessment: Overall WFL for tasks assessed    Lower Extremity Assessment Lower Extremity Assessment: Overall WFL for tasks assessed    Cervical / Trunk Assessment Cervical / Trunk Assessment: Normal  Communication   Communication: No difficulties  Cognition Arousal/Alertness: Awake/alert Behavior During Therapy: WFL for tasks assessed/performed Overall Cognitive Status: Within Functional Limits for tasks assessed  General Comments      Exercises     Assessment/Plan    PT Assessment Patent does not need any further PT services  PT Problem List         PT Treatment Interventions      PT Goals (Current goals can be found in the Care Plan section)  Acute Rehab PT Goals Patient Stated Goal: return home ASAP    Frequency     Barriers to discharge        Co-evaluation               AM-PAC PT "6 Clicks" Daily Activity  Outcome Measure Difficulty turning over in bed (including adjusting bedclothes, sheets and  blankets)?: None Difficulty moving from lying on back to sitting on the side of the bed? : None Difficulty sitting down on and standing up from a chair with arms (e.g., wheelchair, bedside commode, etc,.)?: Unable Help needed moving to and from a bed to chair (including a wheelchair)?: None Help needed walking in hospital room?: None Help needed climbing 3-5 steps with a railing? : A Lot 6 Click Score: 19    End of Session   Activity Tolerance: Patient tolerated treatment well Patient left: in bed;with call bell/phone within reach Nurse Communication: Mobility status PT Visit Diagnosis: Other abnormalities of gait and mobility (R26.89)    Time: 3244-0102 PT Time Calculation (min) (ACUTE ONLY): 25 min   Charges:   PT Evaluation $PT Eval Low Complexity: 1 Low PT Treatments $Gait Training: 8-22 mins        Sherie Don, PT, DPT  Acute Rehabilitation Services Pager [phone] Office [place] 05/23/2018, 5:05 PM

## 2018-05-24 LAB — BASIC METABOLIC PANEL
Anion gap: 7 (ref 5–15)
BUN: 30 mg/dL — ABNORMAL HIGH (ref 8–23)
CO2: 23 mmol/L (ref 22–32)
Calcium: 8.5 mg/dL — ABNORMAL LOW (ref 8.9–10.3)
Chloride: 104 mmol/L (ref 98–111)
Creatinine, Ser: 2.14 mg/dL — ABNORMAL HIGH (ref 0.61–1.24)
GFR calc Af Amer: 36 mL/min — ABNORMAL LOW (ref >60)
GFR calc non Af Amer: 31 mL/min — ABNORMAL LOW (ref >60)
Glucose, Bld: 118 mg/dL — ABNORMAL HIGH (ref 70–99)
Potassium: 4 mmol/L (ref 3.5–5.1)
Sodium: 134 mmol/L — ABNORMAL LOW (ref 135–145)

## 2018-05-24 LAB — MAGNESIUM: Magnesium: 1.9 mg/dL (ref 1.7–2.4)

## 2018-05-24 LAB — GLUCOSE, CAPILLARY
Glucose-Capillary: 92 mg/dL (ref 70–99)
Glucose-Capillary: 79 mg/dL (ref 70–99)
Glucose-Capillary: 120 mg/dL — ABNORMAL HIGH (ref 70–99)
Glucose-Capillary: 67 mg/dL — ABNORMAL LOW (ref 70–99)
Glucose-Capillary: 64 mg/dL — ABNORMAL LOW (ref 70–99)
Glucose-Capillary: 146 mg/dL — ABNORMAL HIGH (ref 70–99)
Glucose-Capillary: 98 mg/dL (ref 70–99)

## 2018-05-24 LAB — CBC
HCT: 31.5 % — ABNORMAL LOW (ref 39.0–52.0)
Hemoglobin: 10.9 g/dL — ABNORMAL LOW (ref 13.0–17.0)
MCH: 29.5 pg (ref 26.0–34.0)
MCHC: 34.6 g/dL (ref 30.0–36.0)
MCV: 85.4 fL (ref 78.0–100.0)
Platelets: 177 10*3/uL (ref 150–400)
RBC: 3.69 MIL/uL — ABNORMAL LOW (ref 4.22–5.81)
RDW: 13.5 % (ref 11.5–15.5)
WBC: 10.4 10*3/uL (ref 4.0–10.5)

## 2018-05-24 MED ORDER — DEXTROSE 50 % IV SOLN
INTRAVENOUS | Status: AC
  Administered 2018-05-24: 25 mL
  Filled 2018-05-24: qty 50

## 2018-05-24 MED ORDER — SODIUM CHLORIDE 0.9 % IV BOLUS
500.0000 mL | Freq: Once | INTRAVENOUS | Status: AC
  Administered 2018-05-24: 500 mL via INTRAVENOUS

## 2018-05-24 MED ORDER — DEXTROSE 50 % IV SOLN
INTRAVENOUS | Status: AC
  Administered 2018-05-24: 50 mL
  Filled 2018-05-24: qty 50

## 2018-05-24 NOTE — Plan of Care (Signed)

## 2018-05-24 NOTE — Progress Notes (Signed)
Pt complaining of abdominal pain and tightness, pt vomitted once, zofran given,

## 2018-05-24 NOTE — Progress Notes (Signed)
Patient observed to be lethargic, able to open one eye when name call. CBG check 67  Hypoglycemic protocol followed re check CBG 120 continue to be lethargic. Refused meal. MD made aware gave new order.

## 2018-05-24 NOTE — Progress Notes (Signed)
Checked CBG 62 hypoglycemic protocol initiated recheched 146.

## 2018-05-24 NOTE — Progress Notes (Signed)
Pt's wife came to unit to check on patient.  Discussed lethargy and patient refusal to take bedtime meds when offered at 2145.  Wife stated he has been like this with previous surgeries when he has been up most of the night and days without solid hours of sleep.  Patient was answering questions coherently however refused to open eyes.  Told wife he wanted to sleep and not be woken to take meds tonight.  He did allow nurse to do the dressing change and apply bacitracin with wife's insistance. Wife said he will be in a lot of pain mid morning tomorrow but that he wanted to "push through" tonight so he could sleep.  She is available to come back to hospital at any time of night if we have concerns.  Will continue to monitor.

## 2018-05-24 NOTE — Progress Notes (Signed)
3 Days Post-Op   Subjective/Chief Complaint: Doesn't feel well this morning.  Had an episode of emesis. Had BM   Objective: Vital signs in last 24 hours: Temp:  [97.5 F (36.4 C)-98.8 F (37.1 C)] 97.5 F (36.4 C) (09/14 0511) Pulse Rate:  [54-64] 64 (09/14 0511) Resp:  [18] 18 (09/13 1547) BP: (135-160)/(62-79) 160/79 (09/14 0511) SpO2:  [98 %] 98 % (09/14 0511) Weight:  [110 kg] 110 kg (09/14 0511) Last BM Date: 05/23/18  Intake/Output from previous day: 09/13 0701 - 09/14 0700 In: 2068.9 [P.O.:520; I.V.:1548.9] Out: -  Intake/Output this shift: No intake/output data recorded.  Exam: Looks a little weak Abdomen soft, obese, dressings dry  Lab Results:  Recent Labs    05/23/18 0453 05/24/18 0651  WBC 8.2 10.4  HGB 8.5* 10.9*  HCT 24.6* 31.5*  PLT 156 177   BMET Recent Labs    05/23/18 0453 05/24/18 0541  NA 136 134*  K 4.4 4.0  CL 108 104  CO2 22 23  GLUCOSE 106* 118*  BUN 27* 30*  CREATININE 2.33* 2.14*  CALCIUM 8.3* 8.5*   PT/INR No results for input(s): LABPROT, INR in the last 72 hours. ABG No results for input(s): PHART, HCO3 in the last 72 hours.  Invalid input(s): PCO2, PO2  Studies/Results: No results found.  Anti-infectives: Anti-infectives (From admission, onward)   Start     Dose/Rate Route Frequency Ordered Stop   05/21/18 1700  ceFAZolin (ANCEF) IVPB 2g/100 mL premix     2 g 200 mL/hr over 30 Minutes Intravenous Every 8 hours 05/21/18 1658 05/21/18 1900   05/21/18 1658  metroNIDAZOLE (FLAGYL) IVPB 500 mg     500 mg 100 mL/hr over 60 Minutes Intravenous Every 8 hours 05/21/18 1658 05/21/18 2105   05/21/18 1015  ceFAZolin (ANCEF) IVPB 2g/100 mL premix  Status:  Discontinued     2 g 200 mL/hr over 30 Minutes Intravenous On call to O.R. 05/21/18 0941 05/21/18 1631      Assessment/Plan: s/p Procedure(s): ILEOSTOMY REVERSAL ERAS PATHWAY (N/A)  Cr down today Given nausea/vomiting, will keep another day  LOS: 3 days     [name] A 05/24/2018

## 2018-05-25 LAB — GLUCOSE, CAPILLARY
Glucose-Capillary: 60 mg/dL — ABNORMAL LOW (ref 70–99)
Glucose-Capillary: 134 mg/dL — ABNORMAL HIGH (ref 70–99)
Glucose-Capillary: 87 mg/dL (ref 70–99)
Glucose-Capillary: 360 mg/dL — ABNORMAL HIGH (ref 70–99)
Glucose-Capillary: 242 mg/dL — ABNORMAL HIGH (ref 70–99)

## 2018-05-25 LAB — CBC
HCT: 30.4 % — ABNORMAL LOW (ref 39.0–52.0)
Hemoglobin: 10.1 g/dL — ABNORMAL LOW (ref 13.0–17.0)
MCH: 28.9 pg (ref 26.0–34.0)
MCHC: 33.2 g/dL (ref 30.0–36.0)
MCV: 86.9 fL (ref 78.0–100.0)
Platelets: 174 10*3/uL (ref 150–400)
RBC: 3.5 MIL/uL — ABNORMAL LOW (ref 4.22–5.81)
RDW: 13.5 % (ref 11.5–15.5)
WBC: 7.8 10*3/uL (ref 4.0–10.5)

## 2018-05-25 LAB — BASIC METABOLIC PANEL
Anion gap: 6 (ref 5–15)
BUN: 30 mg/dL — ABNORMAL HIGH (ref 8–23)
CO2: 23 mmol/L (ref 22–32)
Calcium: 8.4 mg/dL — ABNORMAL LOW (ref 8.9–10.3)
Chloride: 106 mmol/L (ref 98–111)
Creatinine, Ser: 1.99 mg/dL — ABNORMAL HIGH (ref 0.61–1.24)
GFR calc Af Amer: 39 mL/min — ABNORMAL LOW (ref >60)
GFR calc non Af Amer: 34 mL/min — ABNORMAL LOW (ref >60)
Glucose, Bld: 103 mg/dL — ABNORMAL HIGH (ref 70–99)
Potassium: 4.4 mmol/L (ref 3.5–5.1)
Sodium: 135 mmol/L (ref 135–145)

## 2018-05-25 MED ORDER — DEXTROSE 50 % IV SOLN: 50.0000 mL | Freq: Once | INTRAVENOUS | Status: AC

## 2018-05-25 MED ORDER — DEXTROSE 50 % IV SOLN
INTRAVENOUS | Status: AC
  Administered 2018-05-25: 50 mL
  Filled 2018-05-25: qty 50

## 2018-05-25 NOTE — Progress Notes (Signed)
Hypoglycemic Event  CBG: 60  Treatment: D50 IV 50 mL  Symptoms: Sweaty and Shaky  Follow-up CBG: Time:0530 CBG Result:134  Possible Reasons for Event: Inadequate meal intake  Comments/MD notified: yes - pt lethargic. Verbal order for CBC, BMET    Belinda Block

## 2018-05-25 NOTE — Progress Notes (Signed)
4 Days Post-Op   Subjective/Chief Complaint: Pt with some confusion overnight Pt doing well this AM On commode    Objective: Vital signs in last 24 hours: Temp:  [98 F (36.7 C)-99.2 F (37.3 C)] 98 F (36.7 C) (09/15 0455) Pulse Rate:  [55-73] 55 (09/15 0455) Resp:  [15-16] 15 (09/15 0455) BP: (104-138)/(56-66) 126/63 (09/15 0455) SpO2:  [96 %-100 %] 96 % (09/15 0455) Weight:  [122.2 kg] 122.2 kg (09/15 0455) Last BM Date: 05/23/18  Intake/Output from previous day: 09/14 0701 - 09/15 0700 In: 1721.3 [I.V.:1721.3] Out: -  Intake/Output this shift: No intake/output data recorded.  General appearance: alert and cooperative GI: soft, non-tender; bowel sounds normal; no masses,  no organomegaly and inc c/d/i  Lab Results:  Recent Labs    05/23/18 0453 05/24/18 0651  WBC 8.2 10.4  HGB 8.5* 10.9*  HCT 24.6* 31.5*  PLT 156 177   BMET Recent Labs    05/23/18 0453 05/24/18 0541  NA 136 134*  K 4.4 4.0  CL 108 104  CO2 22 23  GLUCOSE 106* 118*  BUN 27* 30*  CREATININE 2.33* 2.14*  CALCIUM 8.3* 8.5*   PT/INR No results for input(s): LABPROT, INR in the last 72 hours. ABG No results for input(s): PHART, HCO3 in the last 72 hours.  Invalid input(s): PCO2, PO2  Studies/Results: No results found.  Anti-infectives: Anti-infectives (From admission, onward)   Start     Dose/Rate Route Frequency Ordered Stop   05/21/18 1700  ceFAZolin (ANCEF) IVPB 2g/100 mL premix     2 g 200 mL/hr over 30 Minutes Intravenous Every 8 hours 05/21/18 1658 05/21/18 1900   05/21/18 1658  metroNIDAZOLE (FLAGYL) IVPB 500 mg     500 mg 100 mL/hr over 60 Minutes Intravenous Every 8 hours 05/21/18 1658 05/21/18 2105   05/21/18 1015  ceFAZolin (ANCEF) IVPB 2g/100 mL premix  Status:  Discontinued     2 g 200 mL/hr over 30 Minutes Intravenous On call to O.R. 05/21/18 0941 05/21/18 1631      Assessment/Plan: s/p Procedure(s): ILEOSTOMY REVERSAL ERAS PATHWAY (N/A) Having BMs AM  labs pending this AM   May be here until Monday based on labs and confusion    LOS: 4 days    Rosario Jacks., [facility] 05/25/2018

## 2018-05-25 NOTE — Progress Notes (Signed)
Helped patient to the bathroom at 8:50. I have checked on the patient several times since then. He is still in the bathroom and refuses to get out of bathroom at this time. I have been unable to give him his medicine. Will continue to check on patient.

## 2018-05-25 NOTE — Plan of Care (Signed)
  Problem: Elimination: Goal: Will not experience complications related to urinary retention Outcome: Progressing   Problem: Safety: Goal: Ability to remain free from injury will improve Outcome: Progressing   

## 2018-05-25 NOTE — Progress Notes (Signed)
Patient's linens wet from sweat. With a second RN attempted to clean pt and give CHG bath.  Patient resisted and then threatened to hit both nurses. Attempted to educate patient but agitated and started swinging at nurse.  Advised patient to call if decided to get cleaned up.

## 2018-05-26 LAB — GLUCOSE, CAPILLARY
Glucose-Capillary: 121 mg/dL — ABNORMAL HIGH (ref 70–99)
Glucose-Capillary: 100 mg/dL — ABNORMAL HIGH (ref 70–99)
Glucose-Capillary: 131 mg/dL — ABNORMAL HIGH (ref 70–99)
Glucose-Capillary: 136 mg/dL — ABNORMAL HIGH (ref 70–99)

## 2018-05-26 MED ORDER — FENTANYL 50 MCG/HR TD PT72
50.0000 ug | MEDICATED_PATCH | TRANSDERMAL | Status: DC
  Administered 2018-05-26: 50 ug via TRANSDERMAL
  Filled 2018-05-26: qty 1

## 2018-05-26 MED ORDER — ACETAMINOPHEN 500 MG PO TABS: 1000.0000 mg | ORAL_TABLET | Freq: Four times a day (QID) | ORAL | Status: DC | PRN

## 2018-05-26 NOTE — Plan of Care (Signed)
  Problem: Education: Goal: Knowledge of General Education information will improve Description: Including pain rating scale, medication(s)/side effects and non-pharmacologic comfort measures Outcome: Progressing   Problem: Activity: Goal: Risk for activity intolerance will decrease Outcome: Progressing   Problem: Nutrition: Goal: Adequate nutrition will be maintained Outcome: Progressing   

## 2018-05-26 NOTE — Progress Notes (Signed)
S: Some nausea/ emesis Saturday am and confusion(?) Saturday night. Did better yesterday. Now back on liquids and tolerating well, no more nausea since Saturday morning or bloating, continues to have bowel movements, pain is well controlled but has required numerous doses of dilaudid last 24h because his fentanyl patch was removed during his bout of confusion over the weekend.    Vitals, labs, intake/output, and orders reviewed at this time. Afebrile, HR 55-62, normo- to hypertensive, sats 98-100% room air. PO 480 (night shift only), UOP Not recorded (!!!). BM x 2. Labs from yesterday noon- WBC 7.8, HGB 10.1, Plt 174. BMP creatinine down to 1.99, bicarb 23, K 4.4  Gen: A&Ox3, no distress  H&N: EOMI, atraumatic, neck supple Chest: unlabored respirations, RRR Abd: soft, minimally tender, nondistended, incision with small amount of serosanguinous drainage on dressing which has been on over 24h per patient; granulation present in between staples, no surrounding erythema/induration/ active bleeding or drainage.   Ext: warm, rle prosthesis Neuro: grossly normal  Lines/tubes/drains: PIV  A/P:  POD 5 reversal of loop ileostomy -Having bowel movements, tolerating PO.  -Continue full liquids, ensure,  he can slowly advance at home -Continue ambulation and pulmonary toilet, PT/OT -Acute kidney injury on chronic kidney disease- creatinine slowly improving. continue 0.45% normal saline. Avoid any nephrotoxic agents/ nsaids other than baby aspirin. Monitor I&Os strictly- risk of fluid overload with hx CHF and vascular disease.   -Chronic pain- continue home regimen with additional oxycodone and dilaudid for breakthrough or severe pain, needs fentanyl patch put back on.  -PVD/CAD/HTN- continue ASA 81, imdur, coreg, lipitor. Resume plavix at discharge -Insulin dependent diabetes- continue sliding scale and lantus  Discharge tomorrow.     Romana Juniper, MD [facility] Surgery, [place] Pager  [phone]

## 2018-05-27 LAB — GLUCOSE, CAPILLARY
Glucose-Capillary: 59 mg/dL — ABNORMAL LOW (ref 70–99)
Glucose-Capillary: 132 mg/dL — ABNORMAL HIGH (ref 70–99)
Glucose-Capillary: 147 mg/dL — ABNORMAL HIGH (ref 70–99)
Glucose-Capillary: 62 mg/dL — ABNORMAL LOW (ref 70–99)
Glucose-Capillary: 236 mg/dL — ABNORMAL HIGH (ref 70–99)

## 2018-05-27 MED ORDER — BASAGLAR KWIKPEN 100 UNIT/ML ~~LOC~~ SOPN: 50.0000 [IU] | PEN_INJECTOR | Freq: Every day | SUBCUTANEOUS | 1 refills | Status: DC

## 2018-05-27 MED ORDER — DEXTROSE 50 % IV SOLN
INTRAVENOUS | Status: AC
  Administered 2018-05-27: 50 mL
  Filled 2018-05-27: qty 50

## 2018-05-27 MED ORDER — ACETAMINOPHEN 325 MG PO TABS
650.0000 mg | ORAL_TABLET | Freq: Four times a day (QID) | ORAL | Status: DC
  Administered 2018-05-27 (×2): 650 mg via ORAL
  Filled 2018-05-27 (×2): qty 2

## 2018-05-27 MED ORDER — HYDROMORPHONE HCL 1 MG/ML IJ SOLN: 0.5000 mg | Freq: Four times a day (QID) | INTRAMUSCULAR | Status: DC | PRN

## 2018-05-27 MED ORDER — DOCUSATE SODIUM 100 MG PO CAPS: 100.0000 mg | ORAL_CAPSULE | Freq: Two times a day (BID) | ORAL | 0 refills | Status: AC | PRN

## 2018-05-27 MED ORDER — OXYCODONE HCL 5 MG PO TABS: 5.0000 mg | ORAL_TABLET | Freq: Four times a day (QID) | ORAL | Status: DC | PRN

## 2018-05-27 MED ORDER — INSULIN GLARGINE 100 UNIT/ML ~~LOC~~ SOLN: 50.0000 [IU] | Freq: Every day | SUBCUTANEOUS | Status: DC

## 2018-05-27 MED ORDER — DOCUSATE SODIUM 100 MG PO CAPS
100.0000 mg | ORAL_CAPSULE | Freq: Every day | ORAL | Status: DC
  Administered 2018-05-27: 100 mg via ORAL
  Filled 2018-05-27: qty 1

## 2018-05-27 NOTE — Discharge Summary (Signed)
Physician Discharge Summary  Patient ID: [name] MRN: [id number] DOB/AGE: 04/20/1956 62 y.o.  Admit date: 05/21/2018 Discharge date: 05/27/2018  Admission Diagnoses:  Discharge Diagnoses:  Active Problems:   Status post reversal of ileostomy   Discharged Condition: good  Hospital Course: Admitted for supportive care following reversal of loop ileostomy. Post-operatively he was able to advance his diet and had bowel function by post-op day 1. His pain was well controlled with oral medications. He did have a mild acute kidney injury on chronic kidney disease stage 3 and this was treated with fluids and observation, this improved by post-op day 3. He experienced nausea and emesis on the morning of post-op day three which resolved with return to a liquid diet, continued to have bowel function. He also had some confusion the evening of post-op day 3 which did not recur. On the morning of post-op day 5 he was tolerating a liquid diet without nausea, emesis or bloating, having bowel movements, ambulating unassisted and his pain was well controlled. He has remained afebrile without tachycardia or leukocytosis throughout his hospitalization. He was observed another 24 hours and was deemed stable for discharge home on post-op day 6.   Consults: None  Significant Diagnostic Studies: labs: see epic  Treatments: IV hydration and surgery: loop ileostomy reversal 05/21/18  Discharge Exam: Blood pressure 125/64, pulse 67, temperature 98.9 F (37.2 C), temperature source Oral, resp. rate 18, height 6\' 2"  (1.88 m), weight 122.2 kg, SpO2 97 %. see rounding note  Disposition: Discharge disposition: 01-Home or Self Care       Discharge Instructions    Change dressing (specify)   Complete by:  As directed    Dressing change: daily with dry gauze   Increase activity slowly   Complete by:  As directed      Allergies as of 05/27/2018      Reactions   Ivp Dye [iodinated Diagnostic Agents]  Anaphylaxis, Other (See Comments)   Breathing problems   Adhesive [tape] Rash   Rash after 2-3 days use      Medication List    STOP taking these medications   HYDROmorphone 2 MG tablet Commonly known as:  DILAUDID   lisinopril 20 MG tablet Commonly known as:  PRINIVIL,ZESTRIL   metFORMIN 1000 MG tablet Commonly known as:  GLUCOPHAGE   torsemide 20 MG tablet Commonly known as:  DEMADEX     TAKE these medications   ALLEVYN SACRUM Pads Apply 1 each topically daily for 10 days.   aspirin EC 81 MG tablet Take 81 mg by mouth daily.   atorvastatin 40 MG tablet Commonly known as:  LIPITOR Take 40 mg by mouth daily.   BASAGLAR KWIKPEN 100 UNIT/ML Sopn Inject 0.5 mLs (50 Units total) into the skin at bedtime. Follow up with Dr. Vanetta Shawl What changed:    how much to take  additional instructions   carvedilol 25 MG tablet Commonly known as:  COREG Take 1.5 tablets (37.5 mg total) by mouth 2 (two) times daily.   clobetasol 0.05 % external solution Commonly known as:  TEMOVATE Apply 1 application topically daily as needed (dry scalp).   clopidogrel 75 MG tablet Commonly known as:  PLAVIX Take 75 mg by mouth daily.   collagenase ointment Commonly known as:  SANTYL Apply topically daily. What changed:    how much to take  when to take this  reasons to take this  additional instructions   docusate sodium 100 MG capsule Commonly known as:  COLACE Take 1 capsule (100 mg total) by mouth 2 (two) times daily as needed for mild constipation.   fentaNYL 50 MCG/HR Commonly known as:  DURAGESIC - dosed mcg/hr Place 1 patch (50 mcg total) onto the skin every 3 (three) days.   ferrous sulfate 325 (65 FE) MG EC tablet Take 1 tablet (325 mg total) by mouth 2 (two) times daily.   fluticasone 0.05 % cream Commonly known as:  CUTIVATE Apply 1 application topically 2 (two) times daily as needed (dry skin on face).   folic acid 1 MG tablet Commonly known as:   FOLVITE Take 1 tablet (1 mg total) by mouth daily. What changed:  how much to take   Gauze Dressing 4"X4" Pads 1 each by Does not apply route daily.   HUMALOG KWIKPEN 100 UNIT/ML KiwkPen Generic drug:  insulin lispro Inject 15 Units into the skin 3 (three) times daily with meals. 15 units with breakfast, lunch, & dinner-- pt may increase dose by 1 unit if sugar is elevated   isosorbide mononitrate 120 MG 24 hr tablet Commonly known as:  IMDUR Take 120 mg by mouth daily.   ketoconazole 2 % cream Commonly known as:  NIZORAL Apply 1 application topically 2 (two) times daily.   metoCLOPramide 10 MG tablet Commonly known as:  REGLAN Take 1 tablet (10 mg total) by mouth 3 (three) times daily with meals. What changed:    when to take this  reasons to take this   multivitamin with minerals Tabs tablet Take 1 tablet by mouth daily.   NEEDLE (REUSABLE) 22 G 22G X 1-1/2" Misc 1 Units by Does not apply route as directed. For B12 IM inj   nitroGLYCERIN 0.4 MG SL tablet Commonly known as:  NITROSTAT Place 1 tablet (0.4 mg total) under the tongue every 5 (five) minutes as needed (as needed for esophageal spasm). What changed:  reasons to take this   omega-3 acid ethyl esters 1 g capsule Commonly known as:  LOVAZA Take 1 g by mouth 2 (two) times daily.   ondansetron 4 MG disintegrating tablet Commonly known as:  ZOFRAN-ODT Take 4 mg by mouth 4 (four) times daily as needed for nausea.   oxyCODONE 5 MG immediate release tablet Commonly known as:  Oxy IR/ROXICODONE Take 1 tablet (5 mg total) by mouth every 6 (six) hours as needed for severe pain.   oxyCODONE-acetaminophen 5-325 MG tablet Commonly known as:  PERCOCET/ROXICET Take 1 tablet by mouth every 8 (eight) hours as needed. What changed:  when to take this   pantoprazole 40 MG tablet Commonly known as:  PROTONIX Take 1 tablet (40 mg total) by mouth daily before breakfast.   pregabalin 100 MG capsule Commonly known as:   LYRICA Take 1 capsule (100 mg total) by mouth 3 (three) times daily.   Probiotic Caps Take 1 capsule by mouth daily after breakfast.   rOPINIRole 2 MG tablet Commonly known as:  REQUIP Take 2 mg by mouth at bedtime.   SYRINGE 3CC/22GX1-1/2" 22G X 1-1/2" 3 ML Misc 1 Syringe by Does not apply route as directed. For b12 IM inj   SYSTANE BALANCE OP Place 2-3 drops into both eyes daily as needed (for dry eyes).   tamsulosin 0.4 MG Caps capsule Commonly known as:  FLOMAX Take 0.4 mg by mouth daily.   TEGADERM FIRST AID STYLE Misc 1 each by Does not apply route daily.            Discharge Care Instructions  (  From admission, onward)         Start     Ordered   05/27/18 0000  Change dressing (specify)    Comments:  Dressing change: daily with dry gauze   05/27/18 2952         Follow-up Information    Clovis Riley, MD Follow up in 2 week(s).   Specialty:  General Surgery Contact information: [address] [place] [address] [place], [place], DO Follow up in 1 week(s).   Specialties:  Family Medicine, Obstetrics and Gynecology Why:  Medication management, kidney disease management Contact information: [place] [address] [phone]           Signed: Clovis Riley 05/27/2018, 8:07 PM

## 2018-05-27 NOTE — Plan of Care (Signed)
  Problem: Coping: Goal: Level of anxiety will decrease Outcome: Progressing   Problem: Elimination: Goal: Will not experience complications related to bowel motility Outcome: Progressing   Problem: Safety: Goal: Ability to remain free from injury will improve Outcome: Progressing   

## 2018-05-27 NOTE — Progress Notes (Signed)
Inpatient Diabetes Program Recommendations  AACE/ADA: New Consensus Statement on Inpatient Glycemic Control (2015)  Target Ranges:  Prepandial:   less than 140 mg/dL      Peak postprandial:   less than 180 mg/dL (1-2 hours)      Critically ill patients:  140 - 180 mg/dL   Results for TRAVARUS, TRUDO (MRN [id number]) as of 05/27/2018 11:32  Ref. Range 05/26/2018 08:48 05/26/2018 12:09 05/26/2018 17:07 05/26/2018 20:55 05/27/2018 08:14 05/27/2018 08:50 05/27/2018 09:13 05/27/2018 09:14  Glucose-Capillary Latest Ref Range: 70 - 99 mg/dL 121 (H) 100 (H) 131 (H) 136 (H) 59 (L) 62 (L) 147 (H) 132 (H)   Review of Glycemic Control  Diabetes history: DM 2 Outpatient Diabetes medications: Lantus 60 units Daily, Humalog 15 units tid, Metformin 1000 mg BID Current orders for Inpatient glycemic control: Lantus 55 units Daily, Novolog 0-15 units tid, Novolog 0-5 units qhs  A1c 7.7% on 9/5  Inpatient Diabetes Program Recommendations:    Patient with hypoglycemia this am of 59 mg/dl.  Please consider decreasing Lantus to 50 units. Based on renal function may also consider decreasing Novolog Correction to more of a sensitive scale 0-9 units tid.  Thanks,  Tama Headings RN, MSN, BC-ADM Inpatient Diabetes Coordinator Team Pager [phone] (8a-5p)

## 2018-05-27 NOTE — Progress Notes (Addendum)
S: No acute events, slept well overnight. There is an emesis recorded but patient denies any nausea or emesis, reports 1 soft bowel movement yesterday (was on commode finishing this when I rounded yesterday) but this is not recorded. No urine output or PO intake recorded for day shift but he has been eating and voiding. Pain is well controlled.   Vitals, labs, intake/output, and orders reviewed at this time. Afebrile, HR 56-63, mildly hypertensive, sats 96-100% room air. I&O recordings are completely inaccurate.   Gen: A&Ox3, no distress, a little sleepy  H&N: EOMI, atraumatic, neck supple Chest: unlabored respirations, RRR Abd: soft, nontender, nondistended, incision with small amount of serosanguinous drainage on dressing, upper staple has pulled out so I removed it; granulation present in wound bed, no surrounding erythema/induration/ active bleeding or drainage.   Ext: warm, rle prosthesis Neuro: grossly normal  Lines/tubes/drains: PIV  A/P:  POD 6 reversal of loop ileostomy -Having bowel movements, tolerating PO.  -Continue full liquids, ensure,  he can slowly advance at home -Continue ambulation and pulmonary toilet, PT/OT -Acute kidney injury on chronic kidney disease (stage III)- improved. Avoid any nephrotoxic agents/ nsaids other than baby aspirin. Monitor I&Os strictly- risk of fluid overload with hx chronic diastolic CHF and vascular disease.   -Chronic pain- continue home regimen with additional oxycodone and dilaudid for breakthrough or severe pain  -PVD/CAD/HTN- continue ASA 81, imdur, coreg, lipitor. Resume plavix at discharge -Insulin dependent diabetes- continue sliding scale and lantus  Discharge today.     Romana Juniper, MD [facility] Surgery, [place] Pager [phone]

## 2018-05-27 NOTE — Care Management Note (Signed)
Case Management Note  Patient Details  Name: NIAM NEPOMUCENO MRN: [id number] Date of Birth: 22-Jun-1956  Subjective/Objective:                    Action/Plan: Pt discharging home with self care. Pt has insurance, transportation and PCP. CM signing off.   Expected Discharge Date:  05/27/18               Expected Discharge Plan:  Home/Self Care  In-House Referral:     Discharge planning Services     Post Acute Care Choice:    Choice offered to:     DME Arranged:    DME Agency:     HH Arranged:    HH Agency:     Status of Service:  Completed, signed off  If discussed at [employer] of Stay Meetings, dates discussed:    Additional Comments:  Pollie Friar, RN 05/27/2018, 10:50 AM

## 2018-06-09 DIAGNOSIS — M67912 Unspecified disorder of synovium and tendon, left shoulder: Secondary | ICD-10-CM | POA: Diagnosis not present

## 2018-06-09 DIAGNOSIS — E1140 Type 2 diabetes mellitus with diabetic neuropathy, unspecified: Secondary | ICD-10-CM | POA: Diagnosis not present

## 2018-06-17 ENCOUNTER — Encounter (INDEPENDENT_AMBULATORY_CARE_PROVIDER_SITE_OTHER): Payer: Self-pay | Admitting: Orthopedic Surgery

## 2018-06-17 ENCOUNTER — Ambulatory Visit (INDEPENDENT_AMBULATORY_CARE_PROVIDER_SITE_OTHER): Payer: 59 | Admitting: Physician Assistant

## 2018-06-17 ENCOUNTER — Ambulatory Visit (INDEPENDENT_AMBULATORY_CARE_PROVIDER_SITE_OTHER): Payer: Self-pay

## 2018-06-17 VITALS — Ht 74.0 in | Wt 269.4 lb

## 2018-06-17 DIAGNOSIS — M7542 Impingement syndrome of left shoulder: Secondary | ICD-10-CM | POA: Diagnosis not present

## 2018-06-17 DIAGNOSIS — M25512 Pain in left shoulder: Secondary | ICD-10-CM

## 2018-06-17 NOTE — Progress Notes (Signed)
Office Visit Note     Patient: [name]           Date of Birth: 09-21-1955           MRN: [id number] Visit Date: 06/17/2018              Requested by: Isaias Sakai, [place], [place] [address] PCP: Isaias Sakai, DO  Chief Complaint  Patient presents with  . Left Shoulder - Pain      HPI: Patient is a 62 year old male who presents with a several week history of difficulty reaching with his left shoulder or raising his left arm.  He is also having difficulty donning his shirts when he has to move his and reach back with his left arm.  He reports no injury to the area.  He reports his pain level is severe.  He does ambulate with a single crutch on the left side as well.  Assessment & Plan: Visit Diagnoses:  1. Impingement syndrome of left shoulder   2. Acute pain of left shoulder     Plan: Counseled patient that his radiographs were negative for any evidence for fracture or significant arthritis.  Patient underwent steroid injection with Depo-Medrol and lidocaine into the left shoulder after informed consent without difficulty and tolerated this well.  We will plan to see him back in 3 weeks.  Follow-Up Instructions: Return in about 3 weeks (around 07/08/2018).   Ortho Exam  Patient is alert, oriented, no adenopathy, well-dressed, normal affect, normal respiratory effort. Patient has tenderness palpation over the proximal biceps and deltoid area.  He has difficulty with internal rotation and abduction.  Positive impingement sign.  He is neurovascularly intact.  Imaging: No results found. No images are attached to the encounter.  Labs: Lab Results  Component Value Date   HGBA1C 7.7 (H) 05/15/2018   HGBA1C 7.1 (H) 01/07/2018   HGBA1C 6.9 (H) 11/04/2017   ESRSEDRATE 35 (H) 09/03/2016   ESRSEDRATE 111 (H) 05/11/2016   ESRSEDRATE 98 (H) 05/04/2016   CRP 1.2 (H) 09/03/2016   CRP 4.7 (H) 05/11/2016   CRP 8.0 (H) 05/04/2016     REPTSTATUS 11/15/2017 FINAL 11/10/2017   GRAMSTAIN  11/04/2017    ABUNDANT WBC PRESENT, PREDOMINANTLY PMN MODERATE GRAM NEGATIVE RODS MODERATE GRAM POSITIVE COCCI Performed at [facility], [place] [address], [place], [place] [address]    CULT  11/10/2017    NO GROWTH 5 DAYS Performed at [facility] [address] [place], [place] [address]    LABORGA STAPHYLOCOCCUS AUREUS 09/26/2014     Lab Results  Component Value Date   ALBUMIN 3.5 05/15/2018   ALBUMIN 3.4 (L) 01/07/2018   ALBUMIN 3.4 (L) 01/03/2018   PREALBUMIN 19.0 05/04/2016   PREALBUMIN 25.0 12/16/2015    Body mass index is 34.59 kg/m.  Orders:  Orders Placed This Encounter  Procedures  . Large Joint Inj: L subacromial bursa  . XR Shoulder Left   No orders of the defined types were placed in this encounter.    Procedures: Large Joint Inj: L subacromial bursa on 06/17/2018 5:05 PM Indications: diagnostic evaluation and pain Details: 22 G 1.5 in needle, posterior approach  Arthrogram: No  Medications: 5 mL lidocaine 1 %; 40 mg methylPREDNISolone acetate 40 MG/ML Outcome: tolerated well, no immediate complications Procedure, treatment alternatives, risks and benefits explained, specific risks discussed. Consent was given by the patient. Immediately prior to procedure a time out was  called to verify the correct patient, procedure, equipment, support staff and site/side marked as required. Patient was prepped and draped in the usual sterile fashion.      Clinical Data: No additional findings.  ROS:  All other systems negative, except as noted in the HPI. Review of Systems  Objective: Vital Signs: Ht 6\' 2"  (1.88 m)   Wt 269 lb 6.4 oz (122.2 kg)   BMI 34.59 kg/m   Specialty Comments:  No specialty comments available.  PMFS History: Patient Active Problem List   Diagnosis Date Noted  . Status post reversal of ileostomy 05/21/2018  . Acute renal failure superimposed on stage 3 chronic  kidney disease ([place]) 02/14/2018  . Chest pain 02/14/2018  . Elevated d-dimer 02/14/2018  . Pleuritic chest pain 02/14/2018  . Chronic pain 02/14/2018  . Normocytic anemia 02/14/2018  . Intra-abdominal abscess ([place]) 11/04/2017  . Chronic venous hypertension (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 ([place]) 12/11/2016  . Non-pressure chronic ulcer of other part of left foot limited to breakdown of skin ([place]) 11/12/2016  . Unilateral primary osteoarthritis, left knee 10/11/2016  . Midfoot ulcer, left, limited to breakdown of skin ([place]) 10/11/2016  . Hypertension 07/30/2016  . History of right below knee amputation ([place]) 07/12/2016  . Pressure ulcer 05/08/2016  . Diabetic foot infection ([place]) 05/04/2016  . Diabetic foot ([place]) 05/04/2016  . Type 2 diabetes mellitus with insulin therapy ([place]) 12/16/2015  . Amputated toe ([place]) 10/21/2015  . Diabetic foot ulcer ([place]) 10/12/2015  . Leg edema, left 09/03/2015  . CAD (dz of distal, mid and proximal RCA with implantation of 3 overlapping drug-eluting stent,) 09/03/2015  . Chronic diastolic heart failure, NYHA class 2 ([place]) 09/03/2015  . Opacity of lung on imaging study 01/14/2015  . Angina pectoris associated with type 2 diabetes mellitus ([place]) 10/28/2014  . Hyperlipidemia 08/25/2014  . Chronic total occlusion of artery of the extremities ([place]) 04/08/2012  . Onychomycosis 02/01/2012  . Occlusion and stenosis of carotid artery without mention of cerebral infarction 06/12/2011  . GERD (gastroesophageal reflux disease) 05/08/2011  . Barrett's esophagus without dysplasia 05/08/2011  . PVD (peripheral vascular disease) ([place]) 02/01/2011  . Former tobacco use 02/01/2011   Past Medical History:  Diagnosis Date  . Barrett's esophagus   . Carotid artery occlusion 11/10/10   LEFT CAROTID ENDARTERECTOMY  . CHF (congestive heart failure) ([place])   . Complication of anesthesia    BP WENT UP AT [facility] "  . COPD (chronic obstructive  pulmonary disease) ([place])   . Coronary artery disease   . Diabetes mellitus   . Diverticulitis   . Diverticulosis of colon (without mention of hemorrhage)   . DJD (degenerative joint disease)   . Fatty liver   . Full dentures   . GERD (gastroesophageal reflux disease)   . H/O Doppler ultrasound 06/28/2012   Lower venous duplex left-- findings consistent with acute deep vein thrombosis involving the personal vein of the left lower extremity. No evidence of Baker's cyst on the left. Duplex scan of the left lower extremity arteries reveal adequate blood flow.   . H/O hiatal hernia   . History of cardiovascular stress test 03/23/2011   ejection fraction measured 51%, with an end-diastolic volume of 034 ml and an end-systolic volume of 81 ml. no evidence of myocardial ischemia.  Marland Kitchen History of echocardiogram 08/22/2011   Mildly hypertrophic left ventricle with normal systolic function and mild diastolic dysfunction. mildly dilated left atrium. no significant valvular abnormalities. no  pericardial effusion no change from previous study 10/2010. the ejection fraction =>55%  . History of Holter monitoring 02/14/2012   Scan quality is fair due to baseline artifact. Predominant rhythm is Sinus. Veentricular Ectopy was noted in single beat form. The total recording time was 24:00 hours. total beats =[id number]. the total time analyzed was 23:58 hours. Heart  Max = 102 at 1:18:22, Min= 52 at 9:56:19 am., Avg= 70. There were 0 pause(s) > 2.5 seconds. the longest pause = 0.00 sec. at 1:51 pm(1)  . Hyperlipidemia   . Hypertension   . Neuromuscular disorder ([place])    peripheral neuropathy  . OSA (obstructive sleep apnea) 12/26/2008   took himself off CPAP; AHI of 6.3/hr overall, 32.7/hr during REM sleep and an RDI of 24.8/hr and 50.9/hr.  . Osteomyelitis ([place])    left 5th metatarsal  . PAD (peripheral artery disease) ([place])    Distal aortogram June 2012. Atherectomy left popliteal artery July 2012.   Marland Kitchen Slurred speech     AS PER WIFE IN D/C NOTE 11/10/10  . Substernal chest pain   . Visual color changes    LEFT EYE; BUT NOT TOTAL BLINDNESS  . Wears glasses     Family History  Problem Relation Age of Onset  . Heart disease Father        Before age 82-  CAD, BPG  . Diabetes Father        Amputation  . Cancer Father        PROSTATE  . Hyperlipidemia Father   . Hypertension Father   . Heart attack Father        Triple BPG  . Varicose Veins Father   . Colon cancer Brother   . Cancer Brother        Colon  . Diabetes Brother   . Heart disease Brother 61       A-Fib. Before age 55  . Hyperlipidemia Brother   . Hypertension Brother   . Cancer Sister        Breast  . Hyperlipidemia Sister   . Hypertension Sister   . Hypertension Son   . Arthritis Unknown        GRANDMOTHER  . Hypertension Unknown        OTHER FAMILY MEMBERS    Past Surgical History:  Procedure Laterality Date  . AMPUTATION  11/05/2011   Procedure: AMPUTATION RAY;  Surgeon: Wylene Simmer, MD;  Location: [facility];  Service: Orthopedics;  Laterality: Right;  Amputation of Right 4&5th Toes  . AMPUTATION Left 11/26/2012   Procedure: AMPUTATION RAY;  Surgeon: Wylene Simmer, MD;  Location: [facility];  Service: Orthopedics;  Laterality: Left;  fourth ray amputation  . AMPUTATION Right 08/27/2014   Procedure: Transmetatarsal Amputation;  Surgeon: Newt Minion, MD;  Location: [facility];  Service: Orthopedics;  Laterality: Right;  . AMPUTATION Right 01/14/2015   Procedure: AMPUTATION BELOW KNEE;  Surgeon: Newt Minion, MD;  Location: [facility];  Service: Orthopedics;  Laterality: Right;  . AMPUTATION Left 10/21/2015   Procedure: Left Foot 5th Ray Amputation;  Surgeon: Newt Minion, MD;  Location: [facility];  Service: Orthopedics;  Laterality: Left;  . ANTERIOR FUSION CERVICAL SPINE  02/06/06   C4-5, C5-6, C6-7; SURGEON DR. MAX COHEN  . CARDIAC CATHETERIZATION  10/31/04   2009  . CAROTID ENDARTERECTOMY  11/10/10  . CAROTID ENDARTERECTOMY Left 11/10/2010    Subtotal occlusion of left internal carotid artery with left hemispheric transient ischemic attacks.  . CARPAL TUNNEL  RELEASE Right 10/21/2013   Procedure: RIGHT CARPAL TUNNEL RELEASE;  Surgeon: Wynonia Sours, MD;  Location: [facility];  Service: Orthopedics;  Laterality: Right;  . CHOLECYSTECTOMY    . COLON SURGERY    . COLONOSCOPY    . COLOSTOMY REVERSAL  05/21/2018   ileostomy reversal  . CYSTOSCOPY WITH STENT PLACEMENT Bilateral 01/13/2018   Procedure: CYSTOSCOPY WITH BILATERAL URETERAL CATHETER PLACEMENT;  Surgeon: Ardis Hughs, MD;  Location: [facility];  Service: Urology;  Laterality: Bilateral;  . ESOPHAGEAL MANOMETRY Bilateral 07/19/2014   Procedure: ESOPHAGEAL MANOMETRY (EM);  Surgeon: Jerene Bears, MD;  Location: [facility];  Service: Gastroenterology;  Laterality: Bilateral;  . FEMORAL ARTERY STENT     x6  . FOOT SURGERY  04/25/2016    EXCISION BASE 5TH METATARSAL AND PARTIAL CUBOID LEFT FOOT  . HERNIA REPAIR     LEFT INGUINAL AND UMBILICAL REPAIRS  . I&D EXTREMITY Left 04/25/2016   Procedure: EXCISION BASE 5TH METATARSAL AND PARTIAL CUBOID LEFT FOOT;  Surgeon: Newt Minion, MD;  Location: [facility];  Service: Orthopedics;  Laterality: Left;  . ILEOSTOMY  01/13/2018   Procedure: ILEOSTOMY;  Surgeon: Clovis Riley, MD;  Location: [facility];  Service: General;;  . ILEOSTOMY CLOSURE N/A 05/21/2018   Procedure: ILEOSTOMY REVERSAL ERAS PATHWAY;  Surgeon: Clovis Riley, MD;  Location: [facility];  Service: General;  Laterality: N/A;  . IR RADIOLOGIST EVAL & MGMT  11/19/2017  . IR RADIOLOGIST EVAL & MGMT  12/03/2017  . IR RADIOLOGIST EVAL & MGMT  12/18/2017  . JOINT REPLACEMENT Right 2001   Total  . LAMINECTOMY     X 3 LUMBAR AND X 2 CERVICAL SPINE OPERATIONS  . LAPAROSCOPIC CHOLECYSTECTOMY W/ CHOLANGIOGRAPHY  11/09/04   SURGEON DR. Luella Cook  . LEFT HEART CATHETERIZATION WITH CORONARY ANGIOGRAM N/A 10/29/2014   Procedure: LEFT HEART CATHETERIZATION WITH CORONARY  ANGIOGRAM;  Surgeon: Laverda Page, MD;  Location[facility] C[facility];  Service: Cardiovascular;  Laterality: N/A;  . LOWER EXTREMITY ANGIOGRAM N/A 03/19/2012   Procedure: LOWER EXTREMITY ANGIOGRAM;  Surgeon: Burnell Blanks, MD;  Location[facility] CATH LAB;  Service: Cardiovascular;  Laterality: N/A;  . NECK SURGERY    . PARTIAL COLECTOMY N/A 01/13/2018   Procedure: LAPAROSCOPIC ASSISTED   SIGMOID COLECTOMY ILEOSTOMY;  Surgeon: Clovis Riley, MD;  Location: [facility];  Service: General;  Laterality: N/A;  . PENILE PROSTHESIS IMPLANT  08/14/05   INFRAPUBIC INSERTION OF INFLATABLE PENILE PROSTHESIS; SURGEON DR. Amalia Hailey  . PERCUTANEOUS CORONARY STENT INTERVENTION (PCI-S) Right 10/29/2014   Procedure: PERCUTANEOUS CORONARY STENT INTERVENTION (PCI-S);  Surgeon: Laverda Page, MD;  Location: [facility] CATH LAB;  Service: Cardiovascular;  Laterality: Right;  . SHOULDER ARTHROSCOPY    . SPINE SURGERY    . TONSILLECTOMY    . TOTAL KNEE ARTHROPLASTY  07/2002   RIGHT KNEE ; SURGEON  DR. [name] ALSO HAD ARTHROSCOPIC RIGHT KNEE IN  10/2001  . ULNAR NERVE TRANSPOSITION Right 10/21/2013   Procedure: RIGHT ELBOW  ULNAR NERVE DECOMPRESSION;  Surgeon: Wynonia Sours, MD;  Location: [facility];  Service: Orthopedics;  Laterality: Right;   Social History   Occupational History  . Occupation: Magazine features editor: UNEMPLOYED  Tobacco Use  . Smoking status: Former Smoker    Packs/day: 0.50    Years: 35.00    Pack years: 17.50    Types: Cigarettes    Last attempt to quit: 10/28/2011    Years  since quitting: 6.6  . Smokeless tobacco: Never Used  Substance and Sexual Activity  . Alcohol use: No    Alcohol/week: 0.0 standard drinks    Comment: "not in a long time"  . Drug use: No  . Sexual activity: Yes    Birth control/protection: Implant

## 2018-06-19 ENCOUNTER — Encounter (INDEPENDENT_AMBULATORY_CARE_PROVIDER_SITE_OTHER): Payer: Self-pay | Admitting: Physician Assistant

## 2018-06-19 MED ORDER — METHYLPREDNISOLONE ACETATE 40 MG/ML IJ SUSP
40.0000 mg | INTRAMUSCULAR | Status: AC | PRN
  Administered 2018-06-17: 40 mg via INTRA_ARTICULAR

## 2018-06-19 MED ORDER — LIDOCAINE HCL 1 % IJ SOLN
5.0000 mL | INTRAMUSCULAR | Status: AC | PRN
  Administered 2018-06-17: 5 mL

## 2018-07-01 DIAGNOSIS — I25119 Atherosclerotic heart disease of native coronary artery with unspecified angina pectoris: Secondary | ICD-10-CM | POA: Diagnosis not present

## 2018-07-01 DIAGNOSIS — R600 Localized edema: Secondary | ICD-10-CM | POA: Diagnosis not present

## 2018-07-01 DIAGNOSIS — I739 Peripheral vascular disease, unspecified: Secondary | ICD-10-CM | POA: Diagnosis not present

## 2018-07-09 ENCOUNTER — Encounter (INDEPENDENT_AMBULATORY_CARE_PROVIDER_SITE_OTHER): Payer: Self-pay | Admitting: Physician Assistant

## 2018-07-09 ENCOUNTER — Ambulatory Visit (INDEPENDENT_AMBULATORY_CARE_PROVIDER_SITE_OTHER): Payer: 59 | Admitting: Physician Assistant

## 2018-07-09 VITALS — Ht 74.0 in | Wt 269.0 lb

## 2018-07-09 DIAGNOSIS — M25512 Pain in left shoulder: Secondary | ICD-10-CM

## 2018-07-09 DIAGNOSIS — M7542 Impingement syndrome of left shoulder: Secondary | ICD-10-CM

## 2018-07-09 NOTE — Progress Notes (Signed)
Office Visit Note   Patient: [name]           Date of Birth: 1956-05-10           MRN: [id number] Visit Date: 07/09/2018              Requested by: Isaias Sakai, [place], [place] [address] PCP: Isaias Sakai, DO  Chief Complaint  Patient presents with  . Left Shoulder - Follow-up    S/p left shoulder injection 06/17/18       HPI: The patient is a 62 yo male who presents for follow up of left shoulder impingement syndrome. He reports no improvement following the steroid injection and if anything he feels it is worse. He reports severe pain at times and is unable to lift the arm to even shoulder height now. He has difficulty using his left arm to help drive due to pain. Pain with trying to put on his shirt. He points to the deltoid area and also reports pain over the shoulder blade with movement. He has chronic numbness and tingling in the fingers due to neuropathy and carpal tunnel syndrome of the left as well. He denies any neck pain or restrictions of neck movement.  He does ambulate with a single crutch on the left side following his remote right BKA.   Assessment & Plan: Visit Diagnoses:  1. Impingement syndrome of left shoulder   2. Acute pain of left shoulder     Plan: Will order MRI to evaluate further for possible rotator cuff tear. He will follow up following the MRI scan.   Follow-Up Instructions: No follow-ups on file.   Ortho Exam  Patient is alert, oriented, no adenopathy, well-dressed, normal affect, normal respiratory effort. Left shoulder with significantly limited range of motion with forward flexion and abduction/internal rotation.  Tenderness to palpation over the proximal biceps and deltoid area. Suspect pain supraspinatous muscle injury.   Imaging: No results found. No images are attached to the encounter.  Labs: Lab Results  Component Value Date   HGBA1C 7.7 (H) 05/15/2018   HGBA1C 7.1 (H)  01/07/2018   HGBA1C 6.9 (H) 11/04/2017   ESRSEDRATE 35 (H) 09/03/2016   ESRSEDRATE 111 (H) 05/11/2016   ESRSEDRATE 98 (H) 05/04/2016   CRP 1.2 (H) 09/03/2016   CRP 4.7 (H) 05/11/2016   CRP 8.0 (H) 05/04/2016   REPTSTATUS 11/15/2017 FINAL 11/10/2017   GRAMSTAIN  11/04/2017    ABUNDANT WBC PRESENT, PREDOMINANTLY PMN MODERATE GRAM NEGATIVE RODS MODERATE GRAM POSITIVE COCCI Performed at [facility], [place] [address], [place], [place] [address]    CULT  11/10/2017    NO GROWTH 5 DAYS Performed at [facility] [address] [place], [place] [address]    LABORGA STAPHYLOCOCCUS AUREUS 09/26/2014     Lab Results  Component Value Date   ALBUMIN 3.5 05/15/2018   ALBUMIN 3.4 (L) 01/07/2018   ALBUMIN 3.4 (L) 01/03/2018   PREALBUMIN 19.0 05/04/2016   PREALBUMIN 25.0 12/16/2015    Body mass index is 34.54 kg/m.  Orders:  Orders Placed This Encounter  Procedures  . MR Shoulder Left w/o contrast   No orders of the defined types were placed in this encounter.    Procedures: No procedures performed  Clinical Data: No additional findings.  ROS:  All other systems negative, except as noted in the HPI. Review of Systems  Objective: Vital Signs: Ht 6\' 2"  (1.88 m)   Wt  269 lb (122 kg)   BMI 34.54 kg/m   Specialty Comments:  No specialty comments available.  PMFS History: Patient Active Problem List   Diagnosis Date Noted  . Status post reversal of ileostomy 05/21/2018  . Acute renal failure superimposed on stage 3 chronic kidney disease ([place]) 02/14/2018  . Chest pain 02/14/2018  . Elevated d-dimer 02/14/2018  . Pleuritic chest pain 02/14/2018  . Chronic pain 02/14/2018  . Normocytic anemia 02/14/2018  . Intra-abdominal abscess ([place]) 11/04/2017  . Chronic venous hypertension (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 ([place]) 12/11/2016  . Non-pressure chronic ulcer of other part of left foot limited to breakdown of skin ([place])  11/12/2016  . Unilateral primary osteoarthritis, left knee 10/11/2016  . Midfoot ulcer, left, limited to breakdown of skin ([place]) 10/11/2016  . Hypertension 07/30/2016  . History of right below knee amputation ([place]) 07/12/2016  . Pressure ulcer 05/08/2016  . Diabetic foot infection ([place]) 05/04/2016  . Diabetic foot ([place]) 05/04/2016  . Type 2 diabetes mellitus with insulin therapy ([place]) 12/16/2015  . Amputated toe ([place]) 10/21/2015  . Diabetic foot ulcer ([place]) 10/12/2015  . Leg edema, left 09/03/2015  . CAD (dz of distal, mid and proximal RCA with implantation of 3 overlapping drug-eluting stent,) 09/03/2015  . Chronic diastolic heart failure, NYHA class 2 ([place]) 09/03/2015  . Opacity of lung on imaging study 01/14/2015  . Angina pectoris associated with type 2 diabetes mellitus ([place]) 10/28/2014  . Hyperlipidemia 08/25/2014  . Chronic total occlusion of artery of the extremities ([place]) 04/08/2012  . Onychomycosis 02/01/2012  . Occlusion and stenosis of carotid artery without mention of cerebral infarction 06/12/2011  . GERD (gastroesophageal reflux disease) 05/08/2011  . Barrett's esophagus without dysplasia 05/08/2011  . PVD (peripheral vascular disease) ([place]) 02/01/2011  . Former tobacco use 02/01/2011   Past Medical History:  Diagnosis Date  . Barrett's esophagus   . Carotid artery occlusion 11/10/10   LEFT CAROTID ENDARTERECTOMY  . CHF (congestive heart failure) ([place])   . Complication of anesthesia    BP WENT UP AT [facility] "  . COPD (chronic obstructive pulmonary disease) ([place])   . Coronary artery disease   . Diabetes mellitus   . Diverticulitis   . Diverticulosis of colon (without mention of hemorrhage)   . DJD (degenerative joint disease)   . Fatty liver   . Full dentures   . GERD (gastroesophageal reflux disease)   . H/O Doppler ultrasound 06/28/2012   Lower venous duplex left-- findings consistent with acute deep vein thrombosis involving the personal vein of the left lower  extremity. No evidence of Baker's cyst on the left. Duplex scan of the left lower extremity arteries reveal adequate blood flow.   . H/O hiatal hernia   . History of cardiovascular stress test 03/23/2011   ejection fraction measured 51%, with an end-diastolic volume of 284 ml and an end-systolic volume of 81 ml. no evidence of myocardial ischemia.  Marland Kitchen History of echocardiogram 08/22/2011   Mildly hypertrophic left ventricle with normal systolic function and mild diastolic dysfunction. mildly dilated left atrium. no significant valvular abnormalities. no pericardial effusion no change from previous study 10/2010. the ejection fraction =>55%  . History of Holter monitoring 02/14/2012   Scan quality is fair due to baseline artifact. Predominant rhythm is Sinus. Veentricular Ectopy was noted in single beat form. The total recording time was 24:00 hours. total beats =[id number]. the total time analyzed was 23:58 hours. Heart  Max = 102 at 1:18:22,  Min= 52 at 9:56:19 am., Avg= 70. There were 0 pause(s) > 2.5 seconds. the longest pause = 0.00 sec. at 1:51 pm(1)  . Hyperlipidemia   . Hypertension   . Neuromuscular disorder ([place])    peripheral neuropathy  . OSA (obstructive sleep apnea) 12/26/2008   took himself off CPAP; AHI of 6.3/hr overall, 32.7/hr during REM sleep and an RDI of 24.8/hr and 50.9/hr.  . Osteomyelitis ([place])    left 5th metatarsal  . PAD (peripheral artery disease) ([place])    Distal aortogram June 2012. Atherectomy left popliteal artery July 2012.   Marland Kitchen Slurred speech    AS PER WIFE IN D/C NOTE 11/10/10  . Substernal chest pain   . Visual color changes    LEFT EYE; BUT NOT TOTAL BLINDNESS  . Wears glasses     Family History  Problem Relation Age of Onset  . Heart disease Father        Before age 21-  CAD, BPG  . Diabetes Father        Amputation  . Cancer Father        PROSTATE  . Hyperlipidemia Father   . Hypertension Father   . Heart attack Father        Triple BPG  . Varicose Veins  Father   . Colon cancer Brother   . Cancer Brother        Colon  . Diabetes Brother   . Heart disease Brother 9       A-Fib. Before age 65  . Hyperlipidemia Brother   . Hypertension Brother   . Cancer Sister        Breast  . Hyperlipidemia Sister   . Hypertension Sister   . Hypertension Son   . Arthritis Unknown        GRANDMOTHER  . Hypertension Unknown        OTHER FAMILY MEMBERS    Past Surgical History:  Procedure Laterality Date  . AMPUTATION  11/05/2011   Procedure: AMPUTATION RAY;  Surgeon: Wylene Simmer, MD;  Location: [facility];  Service: Orthopedics;  Laterality: Right;  Amputation of Right 4&5th Toes  . AMPUTATION Left 11/26/2012   Procedure: AMPUTATION RAY;  Surgeon: Wylene Simmer, MD;  Location: [facility];  Service: Orthopedics;  Laterality: Left;  fourth ray amputation  . AMPUTATION Right 08/27/2014   Procedure: Transmetatarsal Amputation;  Surgeon: Newt Minion, MD;  Location: [facility];  Service: Orthopedics;  Laterality: Right;  . AMPUTATION Right 01/14/2015   Procedure: AMPUTATION BELOW KNEE;  Surgeon: Newt Minion, MD;  Location: [facility];  Service: Orthopedics;  Laterality: Right;  . AMPUTATION Left 10/21/2015   Procedure: Left Foot 5th Ray Amputation;  Surgeon: Newt Minion, MD;  Location: [facility];  Service: Orthopedics;  Laterality: Left;  . ANTERIOR FUSION CERVICAL SPINE  02/06/06   C4-5, C5-6, C6-7; SURGEON DR. MAX COHEN  . CARDIAC CATHETERIZATION  10/31/04   2009  . CAROTID ENDARTERECTOMY  11/10/10  . CAROTID ENDARTERECTOMY Left 11/10/2010   Subtotal occlusion of left internal carotid artery with left hemispheric transient ischemic attacks.  . CARPAL TUNNEL RELEASE Right 10/21/2013   Procedure: RIGHT CARPAL TUNNEL RELEASE;  Surgeon: Wynonia Sours, MD;  Location: [facility];  Service: Orthopedics;  Laterality: Right;  . CHOLECYSTECTOMY    . COLON SURGERY    . COLONOSCOPY    . COLOSTOMY REVERSAL  05/21/2018   ileostomy reversal  . CYSTOSCOPY WITH STENT PLACEMENT  Bilateral 01/13/2018   Procedure:  CYSTOSCOPY WITH BILATERAL URETERAL CATHETER PLACEMENT;  Surgeon: Ardis Hughs, MD;  Location: [facility];  Service: Urology;  Laterality: Bilateral;  . ESOPHAGEAL MANOMETRY Bilateral 07/19/2014   Procedure: ESOPHAGEAL MANOMETRY (EM);  Surgeon: Jerene Bears, MD;  Location[facility];  Service: Gastroenterology;  Laterality: Bilateral;  . FEMORAL ARTERY STENT     x6  . FOOT SURGERY  04/25/2016    EXCISION BASE 5TH METATARSAL AND PARTIAL CUBOID LEFT FOOT  . HERNIA REPAIR     LEFT INGUINAL AND UMBILICAL REPAIRS  . I&D EXTREMITY Left 04/25/2016   Procedure: EXCISION BASE 5TH METATARSAL AND PARTIAL CUBOID LEFT FOOT;  Surgeon: Newt Minion, MD;  Location: [facility];  Service: Orthopedics;  Laterality: Left;  . ILEOSTOMY  01/13/2018   Procedure: ILEOSTOMY;  Surgeon: Clovis Riley, MD;  Location: [facility];  Service: General;;  . ILEOSTOMY CLOSURE N/A 05/21/2018   Procedure: ILEOSTOMY REVERSAL ERAS PATHWAY;  Surgeon: Clovis Riley, MD;  Location: [facility];  Service: General;  Laterality: N/A;  . IR RADIOLOGIST EVAL & MGMT  11/19/2017  . IR RADIOLOGIST EVAL & MGMT  12/03/2017  . IR RADIOLOGIST EVAL & MGMT  12/18/2017  . JOINT REPLACEMENT Right 2001   Total  . LAMINECTOMY     X 3 LUMBAR AND X 2 CERVICAL SPINE OPERATIONS  . LAPAROSCOPIC CHOLECYSTECTOMY W/ CHOLANGIOGRAPHY  11/09/04   SURGEON DR. Luella Cook  . LEFT HEART CATHETERIZATION WITH CORONARY ANGIOGRAM N/A 10/29/2014   Procedure: LEFT HEART CATHETERIZATION WITH CORONARY ANGIOGRAM;  Surgeon: Laverda Page, MD;  Location: [facility];  Service: Cardiovascular;  Laterality: N/A;  . LOWER EXTREMITY ANGIOGRAM N/A 03/19/2012   Procedure: LOWER EXTREMITY ANGIOGRAM;  Surgeon: Burnell Blanks, MD;  Location: [facility];  Service: Cardiovascular;  Laterality: N/A;  . NECK SURGERY    . PARTIAL COLECTOMY N/A 01/13/2018   Procedure: LAPAROSCOPIC ASSISTED   SIGMOID COLECTOMY ILEOSTOMY;  Surgeon: Clovis Riley, MD;   Location[facility];  Service: General;  Laterality: N/A;  . PENILE PROSTHESIS IMPLANT  08/14/05   INFRAPUBIC INSERTION OF INFLATABLE PENILE PROSTHESIS; SURGEON DR. Amalia Hailey  . PERCUTANEOUS CORONARY STENT INTERVENTION (PCI-S) Right 10/29/2014   Procedure: PERCUTANEOUS CORONARY STENT INTERVENTION (PCI-S);  Surgeon: Laverda Page, MD;  Location[facility];  Service: Cardiovascular;  Laterality: Right;  . SHOULDER ARTHROSCOPY    . SPINE SURGERY    . TONSILLECTOMY    . TOTAL KNEE ARTHROPLASTY  07/2002   RIGHT KNEE ; SURGEON  DR. [name] ALSO HAD ARTHROSCOPIC RIGHT KNEE IN  10/2001  . ULNAR NERVE TRANSPOSITION Right 10/21/2013   Procedure: RIGHT ELBOW  ULNAR NERVE DECOMPRESSION;  Surgeon: Wynonia Sours, MD;  Location: [facility];  Service: Orthopedics;  Laterality: Right;   Social History   Occupational History  . Occupation: Magazine features editor: UNEMPLOYED  Tobacco Use  . Smoking status: Former Smoker    Packs/day: 0.50    Years: 35.00    Pack years: 17.50    Types: Cigarettes    Last attempt to quit: 10/28/2011    Years since quitting: 6.7  . Smokeless tobacco: Never Used  Substance and Sexual Activity  . Alcohol use: No    Alcohol/week: 0.0 standard drinks    Comment: "not in a long time"  . Drug use: No  . Sexual activity: Yes    Birth control/protection: Implant

## 2018-08-01 ENCOUNTER — Ambulatory Visit
Admission: RE | Admit: 2018-08-01 | Discharge: 2018-08-01 | Disposition: A | Discharge status: Home / Self Care | Payer: 59 | Source: Ambulatory Visit | Attending: Physician Assistant | Admitting: Physician Assistant

## 2018-08-01 DIAGNOSIS — M7542 Impingement syndrome of left shoulder: Secondary | ICD-10-CM

## 2018-08-01 DIAGNOSIS — M75112 Incomplete rotator cuff tear or rupture of left shoulder, not specified as traumatic: Secondary | ICD-10-CM | POA: Diagnosis not present

## 2018-08-01 DIAGNOSIS — M25512 Pain in left shoulder: Secondary | ICD-10-CM

## 2018-08-06 ENCOUNTER — Encounter (INDEPENDENT_AMBULATORY_CARE_PROVIDER_SITE_OTHER): Payer: Self-pay | Admitting: Family

## 2018-08-06 ENCOUNTER — Ambulatory Visit (INDEPENDENT_AMBULATORY_CARE_PROVIDER_SITE_OTHER): Payer: 59 | Admitting: Family

## 2018-08-06 VITALS — Ht 74.0 in | Wt 269.0 lb

## 2018-08-06 DIAGNOSIS — M75102 Unspecified rotator cuff tear or rupture of left shoulder, not specified as traumatic: Secondary | ICD-10-CM

## 2018-08-06 NOTE — Progress Notes (Signed)
Office Visit Note   Patient: [name]           Date of Birth: 1955-11-19           MRN: [id number] Visit Date: 08/06/2018              Requested by: Isaias Sakai, [place], [place] [id number] PCP: Isaias Sakai, DO  Chief Complaint  Patient presents with  . Left Shoulder - Follow-up    MRI Review      HPI: The patient is a 62 yo male who presents for follow up of left shoulder impingement syndrome. He reports no improvement following the steroid injection and if anything he feels it is worse. He reports severe pain at times and is unable to lift the arm to even shoulder height now. He has difficulty using his left arm to help drive due to pain. Pain with trying to put on his shirt. He points to the deltoid area and also reports pain over the shoulder blade with movement. He has chronic numbness and tingling in the fingers due to neuropathy and carpal tunnel syndrome of the left as well. He denies any neck pain or restrictions of neck movement.  He does ambulate with a single crutch on the left side following his remote right BKA.   He is seen today for MRI review.  Is no better than at last visit.  Assessment & Plan: Visit Diagnoses:  1. Nontraumatic tear of left supraspinatus tendon     Plan: Will set patient up for arthroscopy of the left shoulder with debridement and decompression.  Patient in agreement with the plan.  We will follow-up postoperatively.  Follow-Up Instructions: Return for 1 week post op.   Ortho Exam  Patient is alert, oriented, no adenopathy, well-dressed, normal affect, normal respiratory effort. Left shoulder with significantly limited range of motion with forward flexion and abduction/internal rotation.  Tenderness to palpation over the proximal biceps and deltoid area. Suspect pain supraspinatous muscle injury.   Imaging: No results found. No images are attached to the encounter.  Labs: Lab Results   Component Value Date   HGBA1C 7.7 (H) 05/15/2018   HGBA1C 7.1 (H) 01/07/2018   HGBA1C 6.9 (H) 11/04/2017   ESRSEDRATE 35 (H) 09/03/2016   ESRSEDRATE 111 (H) 05/11/2016   ESRSEDRATE 98 (H) 05/04/2016   CRP 1.2 (H) 09/03/2016   CRP 4.7 (H) 05/11/2016   CRP 8.0 (H) 05/04/2016   REPTSTATUS 11/15/2017 FINAL 11/10/2017   GRAMSTAIN  11/04/2017    ABUNDANT WBC PRESENT, PREDOMINANTLY PMN MODERATE GRAM NEGATIVE RODS MODERATE GRAM POSITIVE COCCI Performed at [facility], [place] [address] [place], [place] [address]    CULT  11/10/2017    NO GROWTH 5 DAYS Performed at [facility] [address] [place], [place] [address]    LABORGA STAPHYLOCOCCUS AUREUS 09/26/2014     Lab Results  Component Value Date   ALBUMIN 3.5 05/15/2018   ALBUMIN 3.4 (L) 01/07/2018   ALBUMIN 3.4 (L) 01/03/2018   PREALBUMIN 19.0 05/04/2016   PREALBUMIN 25.0 12/16/2015    Body mass index is 34.54 kg/m.  Orders:  No orders of the defined types were placed in this encounter.  No orders of the defined types were placed in this encounter.    Procedures: No procedures performed  Clinical Data: No additional findings.  ROS:  All other systems negative, except as noted in the HPI. Review of Systems  Constitutional:  Negative for chills and fever.  Musculoskeletal: Positive for arthralgias and myalgias. Negative for joint swelling.    Objective: Vital Signs: Ht 6\' 2"  (1.88 m)   Wt 269 lb (122 kg)   BMI 34.54 kg/m   Specialty Comments:  No specialty comments available.  PMFS History: Patient Active Problem List   Diagnosis Date Noted  . Status post reversal of ileostomy 05/21/2018  . Acute renal failure superimposed on stage 3 chronic kidney disease ([place]) 02/14/2018  . Chest pain 02/14/2018  . Elevated d-dimer 02/14/2018  . Pleuritic chest pain 02/14/2018  . Chronic pain 02/14/2018  . Normocytic anemia 02/14/2018  . Intra-abdominal abscess ([place]) 11/04/2017  . Chronic venous  hypertension (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 ([place]) 12/11/2016  . Non-pressure chronic ulcer of other part of left foot limited to breakdown of skin ([place]) 11/12/2016  . Unilateral primary osteoarthritis, left knee 10/11/2016  . Midfoot ulcer, left, limited to breakdown of skin ([place]) 10/11/2016  . Hypertension 07/30/2016  . History of right below knee amputation ([place]) 07/12/2016  . Pressure ulcer 05/08/2016  . Diabetic foot infection ([place]) 05/04/2016  . Diabetic foot ([place]) 05/04/2016  . Type 2 diabetes mellitus with insulin therapy ([place]) 12/16/2015  . Amputated toe ([place]) 10/21/2015  . Diabetic foot ulcer ([place]) 10/12/2015  . Leg edema, left 09/03/2015  . CAD (dz of distal, mid and proximal RCA with implantation of 3 overlapping drug-eluting stent,) 09/03/2015  . Chronic diastolic heart failure, NYHA class 2 ([place]) 09/03/2015  . Opacity of lung on imaging study 01/14/2015  . Angina pectoris associated with type 2 diabetes mellitus ([place]) 10/28/2014  . Hyperlipidemia 08/25/2014  . Chronic total occlusion of artery of the extremities ([place]) 04/08/2012  . Onychomycosis 02/01/2012  . Occlusion and stenosis of carotid artery without mention of cerebral infarction 06/12/2011  . GERD (gastroesophageal reflux disease) 05/08/2011  . Barrett's esophagus without dysplasia 05/08/2011  . PVD (peripheral vascular disease) ([place]) 02/01/2011  . Former tobacco use 02/01/2011   Past Medical History:  Diagnosis Date  . Barrett's esophagus   . Carotid artery occlusion 11/10/10   LEFT CAROTID ENDARTERECTOMY  . CHF (congestive heart failure) ([place])   . Complication of anesthesia    BP WENT UP AT [facility] "  . COPD (chronic obstructive pulmonary disease) ([place])   . Coronary artery disease   . Diabetes mellitus   . Diverticulitis   . Diverticulosis of colon (without mention of hemorrhage)   . DJD (degenerative joint disease)   . Fatty liver   . Full dentures   . GERD  (gastroesophageal reflux disease)   . H/O Doppler ultrasound 06/28/2012   Lower venous duplex left-- findings consistent with acute deep vein thrombosis involving the personal vein of the left lower extremity. No evidence of Baker's cyst on the left. Duplex scan of the left lower extremity arteries reveal adequate blood flow.   . H/O hiatal hernia   . History of cardiovascular stress test 03/23/2011   ejection fraction measured 51%, with an end-diastolic volume of 709 ml and an end-systolic volume of 81 ml. no evidence of myocardial ischemia.  Marland Kitchen History of echocardiogram 08/22/2011   Mildly hypertrophic left ventricle with normal systolic function and mild diastolic dysfunction. mildly dilated left atrium. no significant valvular abnormalities. no pericardial effusion no change from previous study 10/2010. the ejection fraction =>55%  . History of Holter monitoring 02/14/2012   Scan quality is fair due to baseline artifact. Predominant rhythm is Sinus. Veentricular Ectopy  was noted in single beat form. The total recording time was 24:00 hours. total beats =[id number]. the total time analyzed was 23:58 hours. Heart  Max = 102 at 1:18:22, Min= 52 at 9:56:19 am., Avg= 70. There were 0 pause(s) > 2.5 seconds. the longest pause = 0.00 sec. at 1:51 pm(1)  . Hyperlipidemia   . Hypertension   . Neuromuscular disorder ([place])    peripheral neuropathy  . OSA (obstructive sleep apnea) 12/26/2008   took himself off CPAP; AHI of 6.3/hr overall, 32.7/hr during REM sleep and an RDI of 24.8/hr and 50.9/hr.  . Osteomyelitis ([place])    left 5th metatarsal  . PAD (peripheral artery disease) ([place])    Distal aortogram June 2012. Atherectomy left popliteal artery July 2012.   Marland Kitchen Slurred speech    AS PER WIFE IN D/C NOTE 11/10/10  . Substernal chest pain   . Visual color changes    LEFT EYE; BUT NOT TOTAL BLINDNESS  . Wears glasses     Family History  Problem Relation Age of Onset  . Heart disease Father        Before age  40-  CAD, BPG  . Diabetes Father        Amputation  . Cancer Father        PROSTATE  . Hyperlipidemia Father   . Hypertension Father   . Heart attack Father        Triple BPG  . Varicose Veins Father   . Colon cancer Brother   . Cancer Brother        Colon  . Diabetes Brother   . Heart disease Brother 21       A-Fib. Before age 16  . Hyperlipidemia Brother   . Hypertension Brother   . Cancer Sister        Breast  . Hyperlipidemia Sister   . Hypertension Sister   . Hypertension Son   . Arthritis Unknown        GRANDMOTHER  . Hypertension Unknown        OTHER FAMILY MEMBERS    Past Surgical History:  Procedure Laterality Date  . AMPUTATION  11/05/2011   Procedure: AMPUTATION RAY;  Surgeon: Wylene Simmer, MD;  Location: [facility];  Service: Orthopedics;  Laterality: Right;  Amputation of Right 4&5th Toes  . AMPUTATION Left 11/26/2012   Procedure: AMPUTATION RAY;  Surgeon: Wylene Simmer, MD;  Location: [facility];  Service: Orthopedics;  Laterality: Left;  fourth ray amputation  . AMPUTATION Right 08/27/2014   Procedure: Transmetatarsal Amputation;  Surgeon: Newt Minion, MD;  Location: [facility];  Service: Orthopedics;  Laterality: Right;  . AMPUTATION Right 01/14/2015   Procedure: AMPUTATION BELOW KNEE;  Surgeon: Newt Minion, MD;  Location: [facility];  Service: Orthopedics;  Laterality: Right;  . AMPUTATION Left 10/21/2015   Procedure: Left Foot 5th Ray Amputation;  Surgeon: Newt Minion, MD;  Location: [facility];  Service: Orthopedics;  Laterality: Left;  . ANTERIOR FUSION CERVICAL SPINE  02/06/06   C4-5, C5-6, C6-7; SURGEON DR. MAX COHEN  . CARDIAC CATHETERIZATION  10/31/04   2009  . CAROTID ENDARTERECTOMY  11/10/10  . CAROTID ENDARTERECTOMY Left 11/10/2010   Subtotal occlusion of left internal carotid artery with left hemispheric transient ischemic attacks.  . CARPAL TUNNEL RELEASE Right 10/21/2013   Procedure: RIGHT CARPAL TUNNEL RELEASE;  Surgeon: Wynonia Sours, MD;  Location: [facility];  Service: Orthopedics;  Laterality: Right;  . CHOLECYSTECTOMY    .  COLON SURGERY    . COLONOSCOPY    . COLOSTOMY REVERSAL  05/21/2018   ileostomy reversal  . CYSTOSCOPY WITH STENT PLACEMENT Bilateral 01/13/2018   Procedure: CYSTOSCOPY WITH BILATERAL URETERAL CATHETER PLACEMENT;  Surgeon: Ardis Hughs, MD;  Location: [facility];  Service: Urology;  Laterality: Bilateral;  . ESOPHAGEAL MANOMETRY Bilateral 07/19/2014   Procedure: ESOPHAGEAL MANOMETRY (EM);  Surgeon: Jerene Bears, MD;  [facility];  Service: Gastroenterology;  Laterality: Bilateral;  . FEMORAL ARTERY STENT     x6  . FOOT SURGERY  04/25/2016    EXCISION BASE 5TH METATARSAL AND PARTIAL CUBOID LEFT FOOT  . HERNIA REPAIR     LEFT INGUINAL AND UMBILICAL REPAIRS  . I&D EXTREMITY Left 04/25/2016   Procedure: EXCISION BASE 5TH METATARSAL AND PARTIAL CUBOID LEFT FOOT;  Surgeon: Newt Minion, MD;  Location: [facility];  Service: Orthopedics;  Laterality: Left;  . ILEOSTOMY  01/13/2018   Procedure: ILEOSTOMY;  Surgeon: Clovis Riley, MD;  Location: [facility];  Service: General;;  . ILEOSTOMY CLOSURE N/A 05/21/2018   Procedure: ILEOSTOMY REVERSAL ERAS PATHWAY;  Surgeon: Clovis Riley, MD;  Location: [facility];  Service: General;  Laterality: N/A;  . IR RADIOLOGIST EVAL & MGMT  11/19/2017  . IR RADIOLOGIST EVAL & MGMT  12/03/2017  . IR RADIOLOGIST EVAL & MGMT  12/18/2017  . JOINT REPLACEMENT Right 2001   Total  . LAMINECTOMY     X 3 LUMBAR AND X 2 CERVICAL SPINE OPERATIONS  . LAPAROSCOPIC CHOLECYSTECTOMY W/ CHOLANGIOGRAPHY  11/09/04   SURGEON DR. Luella Cook  . LEFT HEART CATHETERIZATION WITH CORONARY ANGIOGRAM N/A 10/29/2014   Procedure: LEFT HEART CATHETERIZATION WITH CORONARY ANGIOGRAM;  Surgeon: Laverda Page, MD;  Location: [facility] C[facility] LAB;  Service: Cardiovascular;  Laterality: N/A;  . LOWER EXTREMITY ANGIOGRAM N/A 03/19/2012   Procedure: LOWER EXTREMITY ANGIOGRAM;  Surgeon: Burnell Blanks, MD;  Location: [facility]  CATH LAB;  Service: Cardiovascular;  Laterality: N/A;  . NECK SURGERY    . PARTIAL COLECTOMY N/A 01/13/2018   Procedure: LAPAROSCOPIC ASSISTED   SIGMOID COLECTOMY ILEOSTOMY;  Surgeon: Clovis Riley, MD;  [facility];  Service: General;  Laterality: N/A;  . PENILE PROSTHESIS IMPLANT  08/14/05   INFRAPUBIC INSERTION OF INFLATABLE PENILE PROSTHESIS; SURGEON DR. Amalia Hailey  . PERCUTANEOUS CORONARY STENT INTERVENTION (PCI-S) Right 10/29/2014   Procedure: PERCUTANEOUS CORONARY STENT INTERVENTION (PCI-S);  Surgeon: Laverda Page, MD;  Location: [facility] CATH LAB;  Service: Cardiovascular;  Laterality: Right;  . SHOULDER ARTHROSCOPY    . SPINE SURGERY    . TONSILLECTOMY    . TOTAL KNEE ARTHROPLASTY  07/2002   RIGHT KNEE ; SURGEON  DR. [name] ALSO HAD ARTHROSCOPIC RIGHT KNEE IN  10/2001  . ULNAR NERVE TRANSPOSITION Right 10/21/2013   Procedure: RIGHT ELBOW  ULNAR NERVE DECOMPRESSION;  Surgeon: Wynonia Sours, MD;  Location: [facility];  Service: Orthopedics;  Laterality: Right;   Social History   Occupational History  . Occupation: Magazine features editor: UNEMPLOYED  Tobacco Use  . Smoking status: Former Smoker    Packs/day: 0.50    Years: 35.00    Pack years: 17.50    Types: Cigarettes    Last attempt to quit: 10/28/2011    Years since quitting: 6.7  . Smokeless tobacco: Never Used  Substance and Sexual Activity  . Alcohol use: No    Alcohol/week: 0.0 standard drinks    Comment: "not in a long time"  .  Drug use: No  . Sexual activity: Yes    Birth control/protection: Implant

## 2018-08-12 DIAGNOSIS — E7800 Pure hypercholesterolemia, unspecified: Secondary | ICD-10-CM | POA: Diagnosis not present

## 2018-08-12 DIAGNOSIS — I10 Essential (primary) hypertension: Secondary | ICD-10-CM | POA: Diagnosis not present

## 2018-08-12 DIAGNOSIS — G894 Chronic pain syndrome: Secondary | ICD-10-CM | POA: Diagnosis not present

## 2018-08-12 DIAGNOSIS — Z79899 Other long term (current) drug therapy: Secondary | ICD-10-CM | POA: Diagnosis not present

## 2018-08-13 DIAGNOSIS — Z79899 Other long term (current) drug therapy: Secondary | ICD-10-CM | POA: Diagnosis not present

## 2018-08-19 DIAGNOSIS — E7800 Pure hypercholesterolemia, unspecified: Secondary | ICD-10-CM | POA: Diagnosis not present

## 2018-08-19 DIAGNOSIS — E1140 Type 2 diabetes mellitus with diabetic neuropathy, unspecified: Secondary | ICD-10-CM | POA: Diagnosis not present

## 2018-08-19 DIAGNOSIS — Z125 Encounter for screening for malignant neoplasm of prostate: Secondary | ICD-10-CM | POA: Diagnosis not present

## 2018-08-19 DIAGNOSIS — I10 Essential (primary) hypertension: Secondary | ICD-10-CM | POA: Diagnosis not present

## 2018-09-02 ENCOUNTER — Encounter (HOSPITAL_COMMUNITY): Payer: Medicare Other

## 2018-09-02 ENCOUNTER — Ambulatory Visit: Payer: Medicare Other | Admitting: Family

## 2018-09-10 HISTORY — PX: EYE SURGERY: SHX253

## 2018-09-15 DIAGNOSIS — I4510 Unspecified right bundle-branch block: Secondary | ICD-10-CM | POA: Diagnosis not present

## 2018-09-15 DIAGNOSIS — R0789 Other chest pain: Secondary | ICD-10-CM | POA: Diagnosis not present

## 2018-09-15 DIAGNOSIS — I499 Cardiac arrhythmia, unspecified: Secondary | ICD-10-CM | POA: Diagnosis not present

## 2018-09-16 ENCOUNTER — Encounter (HOSPITAL_COMMUNITY): Payer: Self-pay

## 2018-09-16 ENCOUNTER — Encounter (HOSPITAL_COMMUNITY)
Admission: EM | Disposition: A | Discharge status: Home / Self Care | Payer: Self-pay | Source: Home / Self Care | Attending: Cardiology

## 2018-09-16 ENCOUNTER — Other Ambulatory Visit: Payer: Self-pay | Admitting: Cardiology

## 2018-09-16 ENCOUNTER — Inpatient Hospital Stay (HOSPITAL_COMMUNITY)
Admission: EM | Admit: 2018-09-16 | Discharge: 2018-09-18 | DRG: 286 | Disposition: A | Discharge status: Home / Self Care | Payer: 59 | Attending: Cardiology | Admitting: Cardiology

## 2018-09-16 ENCOUNTER — Emergency Department (HOSPITAL_COMMUNITY): Payer: 59

## 2018-09-16 ENCOUNTER — Other Ambulatory Visit: Payer: Self-pay

## 2018-09-16 DIAGNOSIS — I129 Hypertensive chronic kidney disease with stage 1 through stage 4 chronic kidney disease, or unspecified chronic kidney disease: Secondary | ICD-10-CM | POA: Diagnosis not present

## 2018-09-16 DIAGNOSIS — Z6832 Body mass index (BMI) 32.0-32.9, adult: Secondary | ICD-10-CM

## 2018-09-16 DIAGNOSIS — I5033 Acute on chronic diastolic (congestive) heart failure: Secondary | ICD-10-CM | POA: Diagnosis present

## 2018-09-16 DIAGNOSIS — I130 Hypertensive heart and chronic kidney disease with heart failure and stage 1 through stage 4 chronic kidney disease, or unspecified chronic kidney disease: Principal | ICD-10-CM | POA: Diagnosis present

## 2018-09-16 DIAGNOSIS — I161 Hypertensive emergency: Secondary | ICD-10-CM

## 2018-09-16 DIAGNOSIS — Z955 Presence of coronary angioplasty implant and graft: Secondary | ICD-10-CM

## 2018-09-16 DIAGNOSIS — R0602 Shortness of breath: Secondary | ICD-10-CM | POA: Diagnosis not present

## 2018-09-16 DIAGNOSIS — E669 Obesity, unspecified: Secondary | ICD-10-CM | POA: Diagnosis present

## 2018-09-16 DIAGNOSIS — Z87891 Personal history of nicotine dependence: Secondary | ICD-10-CM

## 2018-09-16 DIAGNOSIS — I200 Unstable angina: Secondary | ICD-10-CM

## 2018-09-16 DIAGNOSIS — I160 Hypertensive urgency: Secondary | ICD-10-CM | POA: Diagnosis present

## 2018-09-16 DIAGNOSIS — E785 Hyperlipidemia, unspecified: Secondary | ICD-10-CM | POA: Diagnosis present

## 2018-09-16 DIAGNOSIS — I10 Essential (primary) hypertension: Secondary | ICD-10-CM

## 2018-09-16 DIAGNOSIS — E1122 Type 2 diabetes mellitus with diabetic chronic kidney disease: Secondary | ICD-10-CM | POA: Diagnosis present

## 2018-09-16 DIAGNOSIS — G4733 Obstructive sleep apnea (adult) (pediatric): Secondary | ICD-10-CM | POA: Diagnosis present

## 2018-09-16 DIAGNOSIS — E1165 Type 2 diabetes mellitus with hyperglycemia: Secondary | ICD-10-CM | POA: Diagnosis present

## 2018-09-16 DIAGNOSIS — E1142 Type 2 diabetes mellitus with diabetic polyneuropathy: Secondary | ICD-10-CM | POA: Diagnosis present

## 2018-09-16 DIAGNOSIS — N183 Chronic kidney disease, stage 3 (moderate): Secondary | ICD-10-CM | POA: Diagnosis not present

## 2018-09-16 DIAGNOSIS — I739 Peripheral vascular disease, unspecified: Secondary | ICD-10-CM

## 2018-09-16 DIAGNOSIS — Z89511 Acquired absence of right leg below knee: Secondary | ICD-10-CM

## 2018-09-16 DIAGNOSIS — E1151 Type 2 diabetes mellitus with diabetic peripheral angiopathy without gangrene: Secondary | ICD-10-CM | POA: Diagnosis present

## 2018-09-16 DIAGNOSIS — D631 Anemia in chronic kidney disease: Secondary | ICD-10-CM | POA: Diagnosis present

## 2018-09-16 DIAGNOSIS — N184 Chronic kidney disease, stage 4 (severe): Secondary | ICD-10-CM | POA: Diagnosis present

## 2018-09-16 DIAGNOSIS — R079 Chest pain, unspecified: Secondary | ICD-10-CM | POA: Diagnosis not present

## 2018-09-16 DIAGNOSIS — I25110 Atherosclerotic heart disease of native coronary artery with unstable angina pectoris: Secondary | ICD-10-CM | POA: Diagnosis present

## 2018-09-16 DIAGNOSIS — Z91041 Radiographic dye allergy status: Secondary | ICD-10-CM

## 2018-09-16 HISTORY — DX: Type 2 diabetes mellitus without complications: E11.9

## 2018-09-16 HISTORY — PX: LEFT HEART CATH AND CORONARY ANGIOGRAPHY: CATH118249

## 2018-09-16 HISTORY — DX: Unstable angina: I20.0

## 2018-09-16 LAB — CBC
HCT: 30.2 % — ABNORMAL LOW (ref 39.0–52.0)
HCT: 32.7 % — ABNORMAL LOW (ref 39.0–52.0)
Hemoglobin: 10 g/dL — ABNORMAL LOW (ref 13.0–17.0)
Hemoglobin: 11.2 g/dL — ABNORMAL LOW (ref 13.0–17.0)
MCH: 28.3 pg (ref 26.0–34.0)
MCH: 28.8 pg (ref 26.0–34.0)
MCHC: 33.1 g/dL (ref 30.0–36.0)
MCHC: 34.3 g/dL (ref 30.0–36.0)
MCV: 85.6 fL (ref 80.0–100.0)
MCV: 84.1 fL (ref 80.0–100.0)
Platelets: 181 10*3/uL (ref 150–400)
Platelets: 185 10*3/uL (ref 150–400)
RBC: 3.53 MIL/uL — ABNORMAL LOW (ref 4.22–5.81)
RBC: 3.89 MIL/uL — ABNORMAL LOW (ref 4.22–5.81)
RDW: 13.2 % (ref 11.5–15.5)
RDW: 13.2 % (ref 11.5–15.5)
WBC: 7.3 10*3/uL (ref 4.0–10.5)
WBC: 9.6 10*3/uL (ref 4.0–10.5)
nRBC: 0 % (ref 0.0–0.2)
nRBC: 0 % (ref 0.0–0.2)

## 2018-09-16 LAB — GLUCOSE, CAPILLARY
Glucose-Capillary: 77 mg/dL (ref 70–99)
Glucose-Capillary: 128 mg/dL — ABNORMAL HIGH (ref 70–99)
Glucose-Capillary: 145 mg/dL — ABNORMAL HIGH (ref 70–99)

## 2018-09-16 LAB — CREATININE, SERUM
Creatinine, Ser: 2.11 mg/dL — ABNORMAL HIGH (ref 0.61–1.24)
GFR calc Af Amer: 37 mL/min — ABNORMAL LOW (ref >60)
GFR calc non Af Amer: 32 mL/min — ABNORMAL LOW (ref >60)

## 2018-09-16 LAB — I-STAT TROPONIN, ED
Troponin i, poc: 0.02 ng/mL (ref 0.00–0.08)
Troponin i, poc: 0.03 ng/mL (ref 0.00–0.08)

## 2018-09-16 LAB — BASIC METABOLIC PANEL
Anion gap: 7 (ref 5–15)
BUN: 13 mg/dL (ref 8–23)
CO2: 26 mmol/L (ref 22–32)
Calcium: 8.3 mg/dL — ABNORMAL LOW (ref 8.9–10.3)
Chloride: 105 mmol/L (ref 98–111)
Creatinine, Ser: 1.96 mg/dL — ABNORMAL HIGH (ref 0.61–1.24)
GFR calc Af Amer: 41 mL/min — ABNORMAL LOW (ref >60)
GFR calc non Af Amer: 35 mL/min — ABNORMAL LOW (ref >60)
Glucose, Bld: 203 mg/dL — ABNORMAL HIGH (ref 70–99)
Potassium: 3.4 mmol/L — ABNORMAL LOW (ref 3.5–5.1)
Sodium: 138 mmol/L (ref 135–145)

## 2018-09-16 LAB — HEMOGLOBIN A1C
Hgb A1c MFr Bld: 5.7 % — ABNORMAL HIGH (ref 4.8–5.6)
Mean Plasma Glucose: 116.89 mg/dL

## 2018-09-16 LAB — TROPONIN I
Troponin I: <0.03 ng/mL (ref <0.03)
Troponin I: <0.03 ng/mL (ref <0.03)

## 2018-09-16 LAB — BRAIN NATRIURETIC PEPTIDE: B Natriuretic Peptide: 265.2 pg/mL — ABNORMAL HIGH (ref 0.0–100.0)

## 2018-09-16 SURGERY — LEFT HEART CATH AND CORONARY ANGIOGRAPHY
Anesthesia: LOCAL

## 2018-09-16 MED ORDER — SODIUM CHLORIDE 0.9 % IV SOLN: 250.0000 mL | INTRAVENOUS | Status: DC | PRN

## 2018-09-16 MED ORDER — DIPHENHYDRAMINE HCL 50 MG/ML IJ SOLN
INTRAMUSCULAR | Status: AC
  Filled 2018-09-16: qty 1

## 2018-09-16 MED ORDER — NITROGLYCERIN 1 MG/10 ML FOR IR/CATH LAB
INTRA_ARTERIAL | Status: AC
  Filled 2018-09-16: qty 10

## 2018-09-16 MED ORDER — SODIUM CHLORIDE 0.9% FLUSH: 3.0000 mL | INTRAVENOUS | Status: DC | PRN

## 2018-09-16 MED ORDER — MIDAZOLAM HCL 2 MG/2ML IJ SOLN
INTRAMUSCULAR | Status: DC | PRN
  Administered 2018-09-16: 2 mg via INTRAVENOUS

## 2018-09-16 MED ORDER — ONDANSETRON HCL 4 MG/2ML IJ SOLN: 4.0000 mg | Freq: Four times a day (QID) | INTRAMUSCULAR | Status: DC | PRN

## 2018-09-16 MED ORDER — SODIUM CHLORIDE 0.9% FLUSH
3.0000 mL | Freq: Two times a day (BID) | INTRAVENOUS | Status: DC
  Administered 2018-09-17 (×2): 3 mL via INTRAVENOUS

## 2018-09-16 MED ORDER — AMLODIPINE BESYLATE 10 MG PO TABS
10.0000 mg | ORAL_TABLET | Freq: Every day | ORAL | Status: DC
  Administered 2018-09-16 – 2018-09-18 (×3): 10 mg via ORAL
  Filled 2018-09-16: qty 1
  Filled 2018-09-16: qty 2
  Filled 2018-09-16: qty 1

## 2018-09-16 MED ORDER — NITROGLYCERIN IN D5W 200-5 MCG/ML-% IV SOLN
INTRAVENOUS | Status: AC
  Filled 2018-09-16: qty 500

## 2018-09-16 MED ORDER — ACETAMINOPHEN 325 MG PO TABS: 650.0000 mg | ORAL_TABLET | ORAL | Status: DC | PRN

## 2018-09-16 MED ORDER — METHYLPREDNISOLONE SODIUM SUCC 125 MG IJ SOLR
INTRAMUSCULAR | Status: DC | PRN
  Administered 2018-09-16: 125 mg via INTRAVENOUS

## 2018-09-16 MED ORDER — IOHEXOL 350 MG/ML SOLN
INTRAVENOUS | Status: DC | PRN
  Administered 2018-09-16: 20 mL via INTRAVENOUS

## 2018-09-16 MED ORDER — FAMOTIDINE IN NACL 20-0.9 MG/50ML-% IV SOLN
INTRAVENOUS | Status: AC
  Filled 2018-09-16: qty 50

## 2018-09-16 MED ORDER — LIDOCAINE HCL (PF) 1 % IJ SOLN
INTRAMUSCULAR | Status: DC | PRN
  Administered 2018-09-16: 2 mL

## 2018-09-16 MED ORDER — HYDRALAZINE HCL 20 MG/ML IJ SOLN
INTRAMUSCULAR | Status: AC
  Filled 2018-09-16: qty 1

## 2018-09-16 MED ORDER — NITROGLYCERIN IN D5W 200-5 MCG/ML-% IV SOLN: 0.0000 ug/min | INTRAVENOUS | Status: DC

## 2018-09-16 MED ORDER — DIPHENHYDRAMINE HCL 50 MG/ML IJ SOLN
INTRAMUSCULAR | Status: DC | PRN
  Administered 2018-09-16: 25 mg via INTRAVENOUS

## 2018-09-16 MED ORDER — HYDROCORTISONE NA SUCCINATE PF 250 MG IJ SOLR: 200.0000 mg | Freq: Once | INTRAMUSCULAR | Status: DC

## 2018-09-16 MED ORDER — CLOPIDOGREL BISULFATE 75 MG PO TABS
75.0000 mg | ORAL_TABLET | Freq: Every day | ORAL | Status: DC
  Administered 2018-09-16 – 2018-09-18 (×3): 75 mg via ORAL
  Filled 2018-09-16 (×3): qty 1

## 2018-09-16 MED ORDER — HYDRALAZINE HCL 25 MG PO TABS
25.0000 mg | ORAL_TABLET | Freq: Three times a day (TID) | ORAL | Status: DC
  Administered 2018-09-16 – 2018-09-17 (×3): 25 mg via ORAL
  Filled 2018-09-16 (×3): qty 1

## 2018-09-16 MED ORDER — FAMOTIDINE IN NACL 20-0.9 MG/50ML-% IV SOLN: 20.0000 mg | Freq: Once | INTRAVENOUS | Status: AC

## 2018-09-16 MED ORDER — FENTANYL CITRATE (PF) 100 MCG/2ML IJ SOLN
INTRAMUSCULAR | Status: DC | PRN
  Administered 2018-09-16 (×2): 50 ug via INTRAVENOUS

## 2018-09-16 MED ORDER — NITROGLYCERIN IN D5W 200-5 MCG/ML-% IV SOLN
INTRAVENOUS | Status: AC | PRN
  Administered 2018-09-16: 10 ug/min via INTRAVENOUS

## 2018-09-16 MED ORDER — FAMOTIDINE IN NACL 20-0.9 MG/50ML-% IV SOLN
INTRAVENOUS | Status: AC | PRN
  Administered 2018-09-16: 20 mg via INTRAVENOUS

## 2018-09-16 MED ORDER — PANTOPRAZOLE SODIUM 40 MG PO TBEC
40.0000 mg | DELAYED_RELEASE_TABLET | Freq: Every day | ORAL | Status: DC
  Administered 2018-09-17 – 2018-09-18 (×2): 40 mg via ORAL
  Filled 2018-09-16 (×2): qty 1

## 2018-09-16 MED ORDER — DIPHENHYDRAMINE HCL 50 MG PO CAPS: 50.0000 mg | ORAL_CAPSULE | Freq: Once | ORAL | Status: DC

## 2018-09-16 MED ORDER — HEPARIN (PORCINE) IN NACL 1000-0.9 UT/500ML-% IV SOLN
INTRAVENOUS | Status: DC | PRN
  Administered 2018-09-16 (×2): 500 mL

## 2018-09-16 MED ORDER — VERAPAMIL HCL 2.5 MG/ML IV SOLN
INTRAVENOUS | Status: AC
  Filled 2018-09-16: qty 2

## 2018-09-16 MED ORDER — CARVEDILOL 12.5 MG PO TABS
37.5000 mg | ORAL_TABLET | Freq: Two times a day (BID) | ORAL | Status: DC
  Administered 2018-09-16 – 2018-09-18 (×4): 37.5 mg via ORAL
  Filled 2018-09-16 (×4): qty 3

## 2018-09-16 MED ORDER — MIDAZOLAM HCL 2 MG/2ML IJ SOLN
INTRAMUSCULAR | Status: AC
  Filled 2018-09-16: qty 2

## 2018-09-16 MED ORDER — SODIUM CHLORIDE 0.9% FLUSH: 3.0000 mL | Freq: Two times a day (BID) | INTRAVENOUS | Status: DC

## 2018-09-16 MED ORDER — HEPARIN SODIUM (PORCINE) 5000 UNIT/ML IJ SOLN
5000.0000 [IU] | Freq: Three times a day (TID) | INTRAMUSCULAR | Status: DC
  Administered 2018-09-16 – 2018-09-18 (×5): 5000 [IU] via SUBCUTANEOUS
  Filled 2018-09-16 (×5): qty 1

## 2018-09-16 MED ORDER — OMEGA-3-ACID ETHYL ESTERS 1 G PO CAPS
2.0000 g | ORAL_CAPSULE | Freq: Two times a day (BID) | ORAL | Status: DC
  Administered 2018-09-17 – 2018-09-18 (×3): 2 g via ORAL
  Filled 2018-09-16 (×5): qty 2

## 2018-09-16 MED ORDER — ASPIRIN EC 81 MG PO TBEC
81.0000 mg | DELAYED_RELEASE_TABLET | Freq: Every day | ORAL | Status: DC
  Administered 2018-09-17 – 2018-09-18 (×2): 81 mg via ORAL
  Filled 2018-09-16 (×3): qty 1

## 2018-09-16 MED ORDER — DIPHENHYDRAMINE HCL 50 MG/ML IJ SOLN: 50.0000 mg | Freq: Once | INTRAMUSCULAR | Status: DC

## 2018-09-16 MED ORDER — ACETAMINOPHEN 325 MG PO TABS
650.0000 mg | ORAL_TABLET | ORAL | Status: DC | PRN
  Administered 2018-09-17: 650 mg via ORAL
  Filled 2018-09-16: qty 2

## 2018-09-16 MED ORDER — METHYLPREDNISOLONE SODIUM SUCC 125 MG IJ SOLR
INTRAMUSCULAR | Status: AC
  Filled 2018-09-16: qty 2

## 2018-09-16 MED ORDER — HEPARIN (PORCINE) 25000 UT/250ML-% IV SOLN
1250.0000 [IU]/h | INTRAVENOUS | Status: DC
  Administered 2018-09-16: 1250 [IU]/h via INTRAVENOUS
  Filled 2018-09-16: qty 250

## 2018-09-16 MED ORDER — LIDOCAINE HCL (PF) 1 % IJ SOLN
INTRAMUSCULAR | Status: AC
  Filled 2018-09-16: qty 30

## 2018-09-16 MED ORDER — FENTANYL CITRATE (PF) 100 MCG/2ML IJ SOLN
INTRAMUSCULAR | Status: AC
  Filled 2018-09-16: qty 2

## 2018-09-16 MED ORDER — VERAPAMIL HCL 2.5 MG/ML IV SOLN
INTRAVENOUS | Status: DC | PRN
  Administered 2018-09-16: 10 mL via INTRA_ARTERIAL

## 2018-09-16 MED ORDER — FUROSEMIDE 10 MG/ML IJ SOLN
40.0000 mg | Freq: Once | INTRAMUSCULAR | Status: AC
  Administered 2018-09-16: 40 mg via INTRAVENOUS
  Filled 2018-09-16: qty 4

## 2018-09-16 MED ORDER — HEPARIN SODIUM (PORCINE) 1000 UNIT/ML IJ SOLN
INTRAMUSCULAR | Status: DC | PRN
  Administered 2018-09-16: 6000 [IU] via INTRAVENOUS

## 2018-09-16 MED ORDER — HYDRALAZINE HCL 20 MG/ML IJ SOLN
INTRAMUSCULAR | Status: DC | PRN
  Administered 2018-09-16: 10 mg via INTRAVENOUS

## 2018-09-16 MED ORDER — HEPARIN BOLUS VIA INFUSION
4000.0000 [IU] | Freq: Once | INTRAVENOUS | Status: AC
  Administered 2018-09-16: 4000 [IU] via INTRAVENOUS
  Filled 2018-09-16: qty 4000

## 2018-09-16 MED ORDER — NITROGLYCERIN 0.4 MG SL SUBL
0.4000 mg | SUBLINGUAL_TABLET | SUBLINGUAL | Status: DC | PRN
  Administered 2018-09-17 (×2): 0.4 mg via SUBLINGUAL
  Filled 2018-09-16: qty 1

## 2018-09-16 MED ORDER — SODIUM CHLORIDE 0.9 % IV SOLN
INTRAVENOUS | Status: DC
  Administered 2018-09-16: 14:00:00 via INTRAVENOUS

## 2018-09-16 SURGICAL SUPPLY — 11 items
CATH 5FR JL3.5 JR4 ANG PIG MP (CATHETERS) ×1 IMPLANT
CATH INFINITI 5FR JL4 (CATHETERS) ×1 IMPLANT
DEVICE RAD COMP TR BAND LRG (VASCULAR PRODUCTS) ×1 IMPLANT
GLIDESHEATH SLEND A-KIT 6F 22G (SHEATH) ×1 IMPLANT
GUIDEWIRE INQWIRE 1.5J.035X260 (WIRE) IMPLANT
INQWIRE 1.5J .035X260CM (WIRE) ×2
KIT HEART LEFT (KITS) ×2 IMPLANT
PACK CARDIAC CATHETERIZATION (CUSTOM PROCEDURE TRAY) ×2 IMPLANT
SHEATH PROBE COVER 6X72 (BAG) ×1 IMPLANT
TRANSDUCER W/STOPCOCK (MISCELLANEOUS) ×2 IMPLANT
TUBING CIL FLEX 10 FLL-RA (TUBING) ×2 IMPLANT

## 2018-09-16 NOTE — ED Triage Notes (Signed)
Pt BIB [facility] for eval of L sided chest pain/pressure. Pt reports onset of chest pressure 3 days PTA. Pt states he called EMS tonight because the chest pain was "taking his breath away" and he had to "actively try and breathe". Pt has NTG patch in place as well as fentanyl patch. Took 325 ASA at home PTA, received 2 SL NTG by EMS w/ minimal relief of chest pain. Pt's wife reports cold sweats at nights and feels as though he is warm to touch. Afebrile orally in triage.

## 2018-09-16 NOTE — ED Notes (Addendum)
Pt request that shoes and prosthesis left in place. Also request right side rail be let down. Then pt staet he feels dizzy. Will inform provider. Side rails up x 2.

## 2018-09-16 NOTE — H&P (Signed)
[name] is an 63 y.o. male.    Please note this is a duplicate chart and is marked for merger.  Chief Complaint: Chest pain HPI: [name]  is a 63 y.o. male  With severe PAD, S/P right below-knee amputation, 2 amputations on the left secondary to diabetic peripheral neuropathy and peripheral arterial disease, uncontrolled diabetes, stage III chronic kidney disease, CAD with angioplasty to right coronary artery in 2016, right carotid endarterectomy in 2012, obstructive sleep apnea not on CPAP, chronic back pain and neck pain with surgery in the past, chronic pain medications, doing well until 4 days ago on 09/12/2017, started experiencing left-sided chest discomfort.  Symptoms initially started to be severe and thought he should go to the emergency room, but symptoms improved.  This was associated with diaphoresis and also shortness of breath.  Episodes lasted several minutes but had continued recurrence throughout the night. Morning he started feeling better but again had recurrence on Saturday.  Sunday he felt better and since then has been having brief episodes of mild chest discomfort again associated with diaphoresis and also shortness of breath.  Last night due to recurrence and also as severity was getting worse, he got concerned about coronary disease and hence presented to the emergency room.  Since being in the ED, has had episodes of chest pain again described it as mild.  Also since Friday has noticed his blood pressure has been uncontrolled.  Prior to that blood pressure has been fairly stable and controlled.  Denies headache, denies visual disturbances, no syncope.  He has chronic left leg edema that is remained stable.  Past Medical History:  Diagnosis Date  . Coronary artery disease   . Diabetes mellitus without complication ([place])   . Hypertension     Past Surgical History:  Procedure Laterality Date  . CAROTID STENT      History reviewed. No pertinent family history.   There is no family still premature coronary artery disease. Social History:  reports that he has quit smoking. He smoked 2.00 packs per day. He has never used smokeless tobacco. He reports previous alcohol use. He reports previous drug use.  He still continues to use E cigarettes.  Allergies: No Known Allergies  Review of Systems  Constitutional: Positive for chills (during diaphoresis).  HENT: Negative.   Eyes: Negative.   Respiratory: Positive for shortness of breath. Negative for cough and hemoptysis.   Cardiovascular: Positive for chest pain and leg swelling (chronic). Negative for palpitations, orthopnea and claudication.  Gastrointestinal: Negative.   Genitourinary: Negative.   Musculoskeletal: Positive for back pain and neck pain.  Skin: Negative.   Neurological: Negative.   Psychiatric/Behavioral: Negative.   All other systems reviewed and are negative.   Blood pressure (!) 178/76, pulse 63, temperature 98.6 F (37 C), temperature source Oral, resp. rate (!) 22, height 6\' 2"  (1.88 m), weight 113.4 kg, SpO2 96 %. Body mass index is 32.1 kg/m.  Physical Exam  Constitutional: He appears well-developed and well-nourished.  Mildly obese in no acute distress.  Eyes: Conjunctivae are normal.  Neck: Neck supple. No JVD present.  Cardiovascular: Normal rate, regular rhythm and normal heart sounds.  No murmur heard. Vascular exam: Faint bilateral carotid bruit present.  Femoral pulses are normal.  Absent left popliteal pulse and pedal pulse.  Right below-knee amputation.  Stump is healthy.  2+ left leg edema, pitting below the knee.  Pulmonary/Chest: Effort normal and breath sounds normal.  Abdominal: Soft. Bowel sounds are normal.  Musculoskeletal: Normal range of motion.        General: Edema present.  Neurological: He is alert.  Skin: Skin is warm.  Psychiatric: He has a normal mood and affect.   Labs: CBC Latest Ref Rng & Units 09/16/2018  WBC 4.0 - 10.5 K/uL 7.3   Hemoglobin 13.0 - 17.0 g/dL 10.0(L)  Hematocrit 39.0 - 52.0 % 30.2(L)  Platelets 150 - 400 K/uL 181   CMP Latest Ref Rng & Units 09/16/2018  Glucose 70 - 99 mg/dL 203(H)  BUN 8 - 23 mg/dL 13  Creatinine 0.61 - 1.24 mg/dL 1.96(H)  Sodium 135 - 145 mmol/L 138  Potassium 3.5 - 5.1 mmol/L 3.4(L)  Chloride 98 - 111 mmol/L 105  CO2 22 - 32 mmol/L 26  Calcium 8.9 - 10.3 mg/dL 8.3(L)   BMP Latest Ref Rng & Units 09/16/2018  Glucose 70 - 99 mg/dL 203(H)  BUN 8 - 23 mg/dL 13  Creatinine 0.61 - 1.24 mg/dL 1.96(H)  Sodium 135 - 145 mmol/L 138  Potassium 3.5 - 5.1 mmol/L 3.4(L)  Chloride 98 - 111 mmol/L 105  CO2 22 - 32 mmol/L 26  Calcium 8.9 - 10.3 mg/dL 8.3(L)    Cardiac Panel (last 3 results) No results for input(s): CKTOTAL, CKMB, TROPONINI, RELINDX in the last 72 hours.  BNP (last 3 results) Recent Labs    09/16/18 0840  BNP 265.2*    Lipid Panel  No results found for: CHOL, TRIG, HDL, CHOLHDL, VLDL, LDLCALC, LDLDIRECT  HEMOGLOBIN A1C No results found for: HGBA1C, MPG   TSH No results for input(s): TSH in the last 8760 hours.   Current Facility-Administered Medications:  .  acetaminophen (TYLENOL) tablet 650 mg, 650 mg, Oral, Q4H PRN, Adrian Prows, MD .  amLODipine (NORVASC) tablet 10 mg, 10 mg, Oral, Daily, Adrian Prows, MD .  Derrill Memo ON 09/17/2018] aspirin EC tablet 81 mg, 81 mg, Oral, Daily, Adrian Prows, MD .  carvedilol (COREG) tablet 37.5 mg, 37.5 mg, Oral, BID WC, Adrian Prows, MD .  clopidogrel (PLAVIX) tablet 75 mg, 75 mg, Oral, Daily, Adrian Prows, MD .  hydrALAZINE (APRESOLINE) tablet 25 mg, 25 mg, Oral, Q8H, Adrian Prows, MD .  nitroGLYCERIN (NITROSTAT) SL tablet 0.4 mg, 0.4 mg, Sublingual, Q5 Min x 3 PRN, Adrian Prows, MD .  omega-3 acid ethyl esters (LOVAZA) capsule 2 g, 2 g, Oral, BID, Adrian Prows, MD .  ondansetron [employer]) injection 4 mg, 4 mg, Intravenous, Q6H PRN, Adrian Prows, MD .  Derrill Memo ON 09/17/2018] pantoprazole (PROTONIX) EC tablet 40 mg, 40 mg, Oral, QAC breakfast,  Adrian Prows, MD No current outpatient medications on file.  CARDIAC STUDIES:  EKG 09/16/2018: Normal sinus rhythm, normal axis, LVH with repolarization abnormality, T wave inversion, cannot exclude lateral ischemia. Assessment/Plan 1.  Unstable angina pectoris Patient with 3 days of on and off chest discomfort associated with marked dyspnea on exertion, associated with diaphoresis and new EKG abnormalities compared to prior EKG with T wave inversion in the lateral leads. 2.  Coronary artery disease of the native vessel with unstable angina pectoris.  History of PCI and stenting of proximal, mid, distal RCA with 3 overlapping DES, 2.5 x 38, 2.75 x 38 and 2.75 x 20 mm Promus Premier DES on 10/29/2014.  Last nuclear stress test in July 2018 was nonischemic. 3.  Peripheral arterial disease with history of left popliteal atherectomy in June 2012, left below-knee PTA in 2014.  S/P right below-knee amputation. 4.  Hypertension, recently uncontrolled. 5.  Anemia of  chronic disease. 6.  Diabetes mellitus type 2 uncontrolled with vascular complications and stage III chronic kidney disease.  Recommendation: Patient is extremely high risk individual with ongoing chest discomfort, he is presently wearing nitroglycerin patch and continues to have on and off chest discomfort.  I will start him on IV heparin, will schedule him for coronary angiography.  We will hydrate him with aggressive fluid, he is also presently in acute diastolic heart failure probably related to uncontrolled hypertension from ongoing chest discomfort.  Prior to the onset of chest discomfort, blood pressure was well controlled.  His medications are actually reconciled as there is duplication of chart, nursing staff is aware.  I have discussed with the patient regarding risk benefits and alternatives to cholangiography and is willing to proceed.  For now I will add sliding scale insulin and I have added amlodipine along with hydralazine for  uncontrolled hypertension along with continuing Coreg at 37.5 mg twice daily.  Continue Lipitor for hyperlipidemia. I will repeat echocardiogram to evaluate his LV systolic function.  Aortic root does not appear to be enlarged by chest x-ray.  Adrian Prows, MD 09/16/2018, 12:27 PM [facility] [place] Pager: [phone] Office: [phone] If no answer: Cell:  [phone]

## 2018-09-16 NOTE — Progress Notes (Signed)
TR BAND REMOVAL  LOCATION:    right radial  DEFLATED PER PROTOCOL:    Yes.    TIME BAND OFF / DRESSING APPLIED:    1830   SITE UPON ARRIVAL:    Level 0  SITE AFTER BAND REMOVAL:    Level 0  CIRCULATION SENSATION AND MOVEMENT:    Within Normal Limits   Yes.    COMMENTS:   Tolerated procedure well 

## 2018-09-16 NOTE — ED Provider Notes (Addendum)
[facility] EMERGENCY DEPARTMENT Provider Note   CSN: [id number] Arrival date & time: 09/16/18  0114     History   Chief Complaint Chief Complaint  Patient presents with  . Chest Pain    HPI [name] is a 63 y.o. male.  Patient complains of chest discomfort off and on and his blood pressure being elevated.  The history is provided by the patient. No language interpreter was used.  Chest Pain  Pain location:  Substernal area and L chest Pain quality: aching   Pain radiates to:  Does not radiate Pain severity:  Moderate Onset quality:  Sudden Timing:  Intermittent Progression:  Waxing and waning Chronicity:  New Context: not breathing   Relieved by:  Nothing Worsened by:  Nothing Ineffective treatments:  None tried Associated symptoms: no abdominal pain, no back pain, no cough, no fatigue and no headache     Past Medical History:  Diagnosis Date  . Coronary artery disease   . Diabetes mellitus without complication ([place])   . Hypertension     There are no active problems to display for this patient.   Past Surgical History:  Procedure Laterality Date  . CAROTID STENT          Home Medications    Prior to Admission medications   Not on File    Family History History reviewed. No pertinent family history.  Social History Social History   Tobacco Use  . Smoking status: Former Smoker    Packs/day: 2.00  . Smokeless tobacco: Never Used  Substance Use Topics  . Alcohol use: Not Currently  . Drug use: Not Currently     Allergies   Patient has no known allergies.   Review of Systems Review of Systems  Constitutional: Negative for appetite change and fatigue.  HENT: Negative for congestion, ear discharge and sinus pressure.   Eyes: Negative for discharge.  Respiratory: Negative for cough.   Cardiovascular: Positive for chest pain.  Gastrointestinal: Negative for abdominal pain and diarrhea.  Genitourinary: Negative for  frequency and hematuria.  Musculoskeletal: Negative for back pain.  Skin: Negative for rash.  Neurological: Negative for seizures and headaches.  Psychiatric/Behavioral: Negative for hallucinations.     Physical Exam Updated Vital Signs BP (!) 186/87   Pulse 61   Temp 98.6 F (37 C) (Oral)   Resp 15   Ht 6\' 2"  (1.88 m)   Wt 113.4 kg   SpO2 97%   BMI 32.10 kg/m   Physical Exam Vitals signs reviewed.  Constitutional:      Appearance: He is well-developed.  HENT:     Head: Normocephalic.     Nose: Nose normal.  Eyes:     General: No scleral icterus.    Conjunctiva/sclera: Conjunctivae normal.  Neck:     Musculoskeletal: Neck supple.     Thyroid: No thyromegaly.  Cardiovascular:     Rate and Rhythm: Normal rate and regular rhythm.     Heart sounds: No murmur. No friction rub. No gallop.   Pulmonary:     Breath sounds: No stridor. No wheezing or rales.  Chest:     Chest wall: No tenderness.  Abdominal:     General: There is no distension.     Tenderness: There is no abdominal tenderness. There is no rebound.  Musculoskeletal: Normal range of motion.  Lymphadenopathy:     Cervical: No cervical adenopathy.  Skin:    Findings: No erythema or rash.  Neurological:  Mental Status: He is alert and oriented to person, place, and time.     Motor: No abnormal muscle tone.     Coordination: Coordination normal.  Psychiatric:        Behavior: Behavior normal.      ED Treatments / Results  Labs (all labs ordered are listed, but only abnormal results are displayed) Labs Reviewed  BASIC METABOLIC PANEL - Abnormal; Notable for the following components:      Result Value   Potassium 3.4 (*)    Glucose, Bld 203 (*)    Creatinine, Ser 1.96 (*)    Calcium 8.3 (*)    GFR calc non Af Amer 35 (*)    GFR calc Af Amer 41 (*)    All other components within normal limits  CBC - Abnormal; Notable for the following components:   RBC 3.53 (*)    Hemoglobin 10.0 (*)    HCT  30.2 (*)    All other components within normal limits  BRAIN NATRIURETIC PEPTIDE - Abnormal; Notable for the following components:   B Natriuretic Peptide 265.2 (*)    All other components within normal limits  I-STAT TROPONIN, ED  I-STAT TROPONIN, ED  I-STAT TROPONIN, ED    EKG   Radiology Dg Chest 2 View  Result Date: 09/16/2018 CLINICAL DATA:  Left-sided chest pain. Shortness of breath. Elevated blood pressure. EXAM: CHEST - 2 VIEW COMPARISON:  None. FINDINGS: Mild cardiomegaly with normal mediastinal contours. Small bilateral pleural effusions and bibasilar atelectasis. Vascular congestion with mild pulmonary edema. No confluent airspace disease. No pneumothorax. No acute osseous abnormalities. IMPRESSION: Findings consistent with mild CHF. Electronically Signed   By: Keith Rake M.D.   On: 09/16/2018 02:26    Procedures Procedures (including critical care time)  Medications Ordered in ED Medications  furosemide (LASIX) injection 40 mg (40 mg Intravenous Given 09/16/18 0930)     Initial Impression / Assessment and Plan / ED Course  I have reviewed the triage vital signs and the nursing notes.  Pertinent labs & imaging results that were available during my care of the patient were reviewed by me and considered in my medical decision making (see chart for details).    CRITICAL CARE Performed by: Milton Ferguson Total critical care time: 35 minutes Critical care time was exclusive of separately billable procedures and treating other patients. Critical care was necessary to treat or prevent imminent or life-threatening deterioration. Critical care was time spent personally by me on the following activities: development of treatment plan with patient and/or surrogate as well as nursing, discussions with consultants, evaluation of patient's response to treatment, examination of patient, obtaining history from patient or surrogate, ordering and performing treatments and  interventions, ordering and review of laboratory studies, ordering and review of radiographic studies, pulse oximetry and re-evaluation of patient's condition.   Patient with a history of coronary artery disease with 3 stents.  Labs unremarkable.  Patient will be seen by cardiology [name]  Final Clinical Impressions(s) / ED Diagnoses   Final diagnoses:  None    ED Discharge Orders    None       Milton Ferguson, MD 09/16/18 1117    Milton Ferguson, MD 09/24/18 1200

## 2018-09-16 NOTE — ED Notes (Signed)
Pt states dizzy feeling has resolved.

## 2018-09-16 NOTE — Progress Notes (Signed)
ANTICOAGULATION CONSULT NOTE - Initial Consult  Pharmacy Consult for heparin Indication: chest pain/ACS  No Known Allergies  Patient Measurements: Height: 6\' 2"  (188 cm) Weight: 250 lb (113.4 kg) IBW/kg (Calculated) : 82.2 Heparin Dosing Weight: 105kg  Vital Signs: Temp: 98.6 F (37 C) (01/07 0123) Temp Source: Oral (01/07 0123) BP: 178/76 (01/07 1100) Pulse Rate: 63 (01/07 1100)  Labs: Recent Labs    09/16/18 0129  HGB 10.0*  HCT 30.2*  PLT 181  CREATININE 1.96*    Estimated Creatinine Clearance: 51 mL/min (A) (by C-G formula based on SCr of 1.96 mg/dL (H)).   Medical History: Past Medical History:  Diagnosis Date  . Coronary artery disease   . Diabetes mellitus without complication ([place])   . Hypertension     Medications:  Infusions:  . heparin      Assessment: 30 yom presented to the ED with CP. To start IV heparin. Baseline Hgb slightly low and platelets are WNL. He is not on anticoagulation PTA.   Goal of Therapy:  Heparin level 0.3-0.7 units/ml Monitor platelets by anticoagulation protocol: Yes   Plan:  Heparin bolus 4000 units IV x 1 Heparin gtt 1250 units/hr Check a 6 hr heparin level Daily heparin level and CBC  [name], Rande Lawman 09/16/2018,12:30 PM

## 2018-09-16 NOTE — Interval H&P Note (Signed)
History and Physical Interval Note:  09/16/2018 2:43 PM  [name]  has presented today for surgery, with the diagnosis of cp  The various methods of treatment have been discussed with the patient and family. After consideration of risks, benefits and other options for treatment, the patient has consented to  Procedure(s): LEFT HEART CATH AND CORONARY ANGIOGRAPHY (N/A) as a surgical intervention .  The patient's history has been reviewed, patient examined, no change in status, stable for surgery.  I have reviewed the patient's chart and labs.  Questions were answered to the patient's satisfaction.    2016 Appropriate Use Criteria for Coronary Revascularization in Patients With Acute Coronary Syndrome NSTEMI/UA Intermediate Risk (TIMI Score 3-4) NSTEMI/Unstable angina, stabilized patient at Intermediate Risk (TIMI Score 3-4) Link Here: http://dodson-rose.net/ Indication:  Revascularization by PCI or CABG of 1 or more arteries in a patient with NSTEMI or unstable angina with Stabilization after presentation Intermediate risk for clinical events  A (7) Indication: 16; Score 7     [place]

## 2018-09-16 NOTE — ED Triage Notes (Signed)
PT received 324mg  ASA  From EMS this AM 09-16-2018

## 2018-09-17 ENCOUNTER — Encounter (HOSPITAL_COMMUNITY): Payer: Self-pay | Admitting: Cardiology

## 2018-09-17 ENCOUNTER — Observation Stay (HOSPITAL_COMMUNITY): Payer: 59

## 2018-09-17 DIAGNOSIS — N184 Chronic kidney disease, stage 4 (severe): Secondary | ICD-10-CM | POA: Diagnosis present

## 2018-09-17 DIAGNOSIS — I25110 Atherosclerotic heart disease of native coronary artery with unstable angina pectoris: Secondary | ICD-10-CM | POA: Diagnosis not present

## 2018-09-17 DIAGNOSIS — I5033 Acute on chronic diastolic (congestive) heart failure: Secondary | ICD-10-CM | POA: Diagnosis not present

## 2018-09-17 DIAGNOSIS — E1165 Type 2 diabetes mellitus with hyperglycemia: Secondary | ICD-10-CM | POA: Diagnosis present

## 2018-09-17 DIAGNOSIS — E785 Hyperlipidemia, unspecified: Secondary | ICD-10-CM | POA: Diagnosis present

## 2018-09-17 DIAGNOSIS — N183 Chronic kidney disease, stage 3 (moderate): Secondary | ICD-10-CM | POA: Diagnosis not present

## 2018-09-17 DIAGNOSIS — Z87891 Personal history of nicotine dependence: Secondary | ICD-10-CM | POA: Diagnosis not present

## 2018-09-17 DIAGNOSIS — I130 Hypertensive heart and chronic kidney disease with heart failure and stage 1 through stage 4 chronic kidney disease, or unspecified chronic kidney disease: Secondary | ICD-10-CM | POA: Diagnosis not present

## 2018-09-17 DIAGNOSIS — I129 Hypertensive chronic kidney disease with stage 1 through stage 4 chronic kidney disease, or unspecified chronic kidney disease: Secondary | ICD-10-CM | POA: Diagnosis not present

## 2018-09-17 DIAGNOSIS — I160 Hypertensive urgency: Secondary | ICD-10-CM | POA: Diagnosis present

## 2018-09-17 DIAGNOSIS — Z89511 Acquired absence of right leg below knee: Secondary | ICD-10-CM | POA: Diagnosis not present

## 2018-09-17 DIAGNOSIS — E1122 Type 2 diabetes mellitus with diabetic chronic kidney disease: Secondary | ICD-10-CM | POA: Diagnosis present

## 2018-09-17 DIAGNOSIS — D631 Anemia in chronic kidney disease: Secondary | ICD-10-CM | POA: Diagnosis present

## 2018-09-17 DIAGNOSIS — I161 Hypertensive emergency: Secondary | ICD-10-CM | POA: Diagnosis not present

## 2018-09-17 DIAGNOSIS — Z955 Presence of coronary angioplasty implant and graft: Secondary | ICD-10-CM | POA: Diagnosis not present

## 2018-09-17 DIAGNOSIS — I200 Unstable angina: Secondary | ICD-10-CM | POA: Diagnosis present

## 2018-09-17 DIAGNOSIS — G4733 Obstructive sleep apnea (adult) (pediatric): Secondary | ICD-10-CM | POA: Diagnosis present

## 2018-09-17 DIAGNOSIS — E669 Obesity, unspecified: Secondary | ICD-10-CM | POA: Diagnosis present

## 2018-09-17 DIAGNOSIS — E1142 Type 2 diabetes mellitus with diabetic polyneuropathy: Secondary | ICD-10-CM | POA: Diagnosis present

## 2018-09-17 DIAGNOSIS — Z91041 Radiographic dye allergy status: Secondary | ICD-10-CM | POA: Diagnosis not present

## 2018-09-17 DIAGNOSIS — E1151 Type 2 diabetes mellitus with diabetic peripheral angiopathy without gangrene: Secondary | ICD-10-CM | POA: Diagnosis present

## 2018-09-17 DIAGNOSIS — Z6832 Body mass index (BMI) 32.0-32.9, adult: Secondary | ICD-10-CM | POA: Diagnosis not present

## 2018-09-17 LAB — GLUCOSE, CAPILLARY
Glucose-Capillary: 258 mg/dL — ABNORMAL HIGH (ref 70–99)
Glucose-Capillary: 263 mg/dL — ABNORMAL HIGH (ref 70–99)
Glucose-Capillary: 126 mg/dL — ABNORMAL HIGH (ref 70–99)
Glucose-Capillary: 182 mg/dL — ABNORMAL HIGH (ref 70–99)

## 2018-09-17 LAB — ECHOCARDIOGRAM COMPLETE
Height: 74 in
Weight: 3950.64 oz

## 2018-09-17 LAB — HIV ANTIBODY (ROUTINE TESTING W REFLEX): HIV Screen 4th Generation wRfx: NONREACTIVE

## 2018-09-17 LAB — TROPONIN I: Troponin I: 0.03 ng/mL (ref <0.03)

## 2018-09-17 MED ORDER — FENTANYL 50 MCG/HR TD PT72
50.0000 ug | MEDICATED_PATCH | TRANSDERMAL | Status: DC
  Administered 2018-09-17: 50 ug via TRANSDERMAL
  Filled 2018-09-17: qty 1

## 2018-09-17 MED ORDER — TAMSULOSIN HCL 0.4 MG PO CAPS
0.4000 mg | ORAL_CAPSULE | Freq: Every day | ORAL | Status: DC
  Administered 2018-09-17 – 2018-09-18 (×2): 0.4 mg via ORAL
  Filled 2018-09-17 (×2): qty 1

## 2018-09-17 MED ORDER — FERROUS SULFATE 325 (65 FE) MG PO TABS
325.0000 mg | ORAL_TABLET | Freq: Every day | ORAL | Status: DC
  Administered 2018-09-18: 325 mg via ORAL
  Filled 2018-09-17: qty 1

## 2018-09-17 MED ORDER — OXYCODONE-ACETAMINOPHEN 5-325 MG PO TABS
1.0000 | ORAL_TABLET | Freq: Three times a day (TID) | ORAL | Status: DC
  Administered 2018-09-17 – 2018-09-18 (×3): 1 via ORAL
  Filled 2018-09-17 (×3): qty 1

## 2018-09-17 MED ORDER — LINACLOTIDE 145 MCG PO CAPS
145.0000 ug | ORAL_CAPSULE | Freq: Every day | ORAL | Status: DC
  Filled 2018-09-17: qty 1

## 2018-09-17 MED ORDER — PREGABALIN 100 MG PO CAPS
100.0000 mg | ORAL_CAPSULE | Freq: Three times a day (TID) | ORAL | Status: DC
  Administered 2018-09-17 – 2018-09-18 (×4): 100 mg via ORAL
  Filled 2018-09-17 (×4): qty 1

## 2018-09-17 MED ORDER — FENTANYL 12 MCG/HR TD PT72: 50.0000 ug | MEDICATED_PATCH | TRANSDERMAL | Status: DC

## 2018-09-17 MED ORDER — FOLIC ACID 1 MG PO TABS
1.0000 mg | ORAL_TABLET | Freq: Every day | ORAL | Status: DC
  Administered 2018-09-18: 1 mg via ORAL
  Filled 2018-09-17: qty 1

## 2018-09-17 MED ORDER — ROPINIROLE HCL 1 MG PO TABS
2.0000 mg | ORAL_TABLET | Freq: Every day | ORAL | Status: DC
  Administered 2018-09-17: 21:00:00 2 mg via ORAL
  Filled 2018-09-17: qty 2

## 2018-09-17 MED ORDER — SACCHAROMYCES BOULARDII 250 MG PO CAPS
250.0000 mg | ORAL_CAPSULE | Freq: Every day | ORAL | Status: DC
  Administered 2018-09-18: 250 mg via ORAL
  Filled 2018-09-17: qty 1

## 2018-09-17 MED ORDER — GLYBURIDE 2.5 MG PO TABS: 2.5000 mg | ORAL_TABLET | Freq: Every day | ORAL | Status: DC

## 2018-09-17 MED ORDER — ISOSORB DINITRATE-HYDRALAZINE 20-37.5 MG PO TABS
2.0000 | ORAL_TABLET | Freq: Three times a day (TID) | ORAL | Status: DC
  Administered 2018-09-17 – 2018-09-18 (×3): 2 via ORAL
  Filled 2018-09-17 (×3): qty 2

## 2018-09-17 MED ORDER — INSULIN ASPART 100 UNIT/ML ~~LOC~~ SOLN
0.0000 [IU] | Freq: Three times a day (TID) | SUBCUTANEOUS | Status: DC
  Administered 2018-09-17: 16:00:00 3 [IU] via SUBCUTANEOUS
  Administered 2018-09-18: 2 [IU] via SUBCUTANEOUS

## 2018-09-17 NOTE — Progress Notes (Signed)
Subjective:  No further chest pain, feels well, still concerned about blood pressure.  Intake/Output from previous day:  I/O last 3 completed shifts: In: 240 [P.O.:240] Out: 2800 [Urine:2800] Total I/O In: 360 [P.O.:360] Out: 275 [Urine:275]  Blood pressure (!) 153/68, pulse 77, temperature 98.9 F (37.2 C), temperature source Oral, resp. rate 17, height 6\' 2"  (1.88 m), weight 112 kg, SpO2 96 %. Constitutional: He appears well-developed and well-nourished.  Mildly obese in no acute distress.  Eyes: Conjunctivae are normal.  Neck: Neck supple. No JVD present.  Cardiovascular: Normal rate, regular rhythm and normal heart sounds.  No murmur heard. Vascular exam: Faint bilateral carotid bruit present.  Femoral pulses are normal.  Absent left popliteal pulse and pedal pulse.  Right below-knee amputation.  Stump is healthy.  2+ left leg edema, pitting below the knee.  Pulmonary/Chest: Effort normal and breath sounds normal.  Abdominal: Soft. Bowel sounds are normal.  Musculoskeletal: Normal range of motion.        General: Edema present, left leg  1 plus.  Lab Results: BMP BNP (last 3 results) Recent Labs    09/16/18 0840  BNP 265.2*    ProBNP (last 3 results) No results for input(s): PROBNP in the last 8760 hours. BMP Latest Ref Rng & Units 09/16/2018 09/16/2018  Glucose 70 - 99 mg/dL - 203(H)  BUN 8 - 23 mg/dL - 13  Creatinine 0.61 - 1.24 mg/dL 2.11(H) 1.96(H)  Sodium 135 - 145 mmol/L - 138  Potassium 3.5 - 5.1 mmol/L - 3.4(L)  Chloride 98 - 111 mmol/L - 105  CO2 22 - 32 mmol/L - 26  Calcium 8.9 - 10.3 mg/dL - 8.3(L)   No flowsheet data found. CBC Latest Ref Rng & Units 09/16/2018 09/16/2018  WBC 4.0 - 10.5 K/uL 9.6 7.3  Hemoglobin 13.0 - 17.0 g/dL 11.2(L) 10.0(L)  Hematocrit 39.0 - 52.0 % 32.7(L) 30.2(L)  Platelets 150 - 400 K/uL 185 181   Lipid Panel  No results found for: CHOL, TRIG, HDL, CHOLHDL, VLDL, LDLCALC, LDLDIRECT Cardiac Panel (last 3 results) Recent Labs   09/16/18 1633 09/16/18 1806 09/17/18 0024  TROPONINI <0.03 <0.03 0.03*    HEMOGLOBIN A1C Lab Results  Component Value Date   HGBA1C 5.7 (H) 09/16/2018   MPG 116.89 09/16/2018   TSH No results for input(s): TSH in the last 8760 hours. Imaging: Imaging results have been reviewed  Cardiac Studies: Coronary angiogram  09/16/2018: LM: Normal LCx: Normal LAD: High Diag 1 40-50% disease LCx: Normal RCA: Patent prox to distal RCA stents. Mild 20% prox late lumen loss LVEDP 32 mmHg  Conclusion: Nonobstructive CAD with patent RCA stents Elevated LVEDP 32 mmHg, Hypertensive emergency.  EKG:  09/16/2018: Normal sinus rhythm, normal axis, LVH with repolarization abnormality, T wave inversion, cannot exclude lateral ischemia. Echo pending Assessment/Plan:  1.  Hypertensive urgency with chest discomfort and unstable angina pectoris due to microvascular angina.  Markedly elevated LVEDP by angiography yesterday. 2.  Coronary artery disease of the native vessel with unstable angina pectoris.  History of PCI and stenting of proximal, mid, distal RCA with 3 overlapping DES, 2.5 x 38, 2.75 x 38 and 2.75 x 20 mm Promus Premier DES on 10/29/2014.  Last nuclear stress test in July 2018 was nonischemic. 3.  Peripheral arterial disease with history of left popliteal atherectomy in June 2012, left below-knee PTA in 2014.  S/P right below-knee amputation. 4.  Hypertension, recently uncontrolled. 5.  Anemia of chronic disease. 6.  Diabetes mellitus type 2 controlled  with hyperglycemia with vascular complications and stage III chronic kidney disease. 7.  Acute on chronic diastolic heart failure due to hypertensive urgency.  Recommendation:  Blood sugars have been trending high, will reinitiate sliding scale insulin, unable to give metformin due to recent contrast exposure but can be started tomorrow.  Blood pressure still continues to be uncontrolled, I have added BiDil 2 tablets 3 times daily and I  will discontinue Nitro-Dur, hydralazine.  I will evaluate his response.  Not a candidate for ACE inhibitor or an ARB in view of chronic renal insufficiency.  Renal function stable.  Adrian Prows, M.D. 09/17/2018, 12:24 PM [facility] Pager: [phone] Office: [phone] If no answer: [phone]

## 2018-09-17 NOTE — Progress Notes (Signed)
  Echocardiogram 2D Echocardiogram has been performed.  [name] 09/17/2018, 3:09 PM

## 2018-09-18 DIAGNOSIS — I739 Peripheral vascular disease, unspecified: Secondary | ICD-10-CM

## 2018-09-18 DIAGNOSIS — G4733 Obstructive sleep apnea (adult) (pediatric): Secondary | ICD-10-CM | POA: Insufficient documentation

## 2018-09-18 DIAGNOSIS — I10 Essential (primary) hypertension: Secondary | ICD-10-CM

## 2018-09-18 DIAGNOSIS — Z89511 Acquired absence of right leg below knee: Secondary | ICD-10-CM

## 2018-09-18 DIAGNOSIS — S98132A Complete traumatic amputation of one left lesser toe, initial encounter: Secondary | ICD-10-CM | POA: Insufficient documentation

## 2018-09-18 DIAGNOSIS — Z9989 Dependence on other enabling machines and devices: Secondary | ICD-10-CM | POA: Insufficient documentation

## 2018-09-18 DIAGNOSIS — G8929 Other chronic pain: Secondary | ICD-10-CM | POA: Insufficient documentation

## 2018-09-18 DIAGNOSIS — M549 Dorsalgia, unspecified: Secondary | ICD-10-CM

## 2018-09-18 DIAGNOSIS — I161 Hypertensive emergency: Secondary | ICD-10-CM

## 2018-09-18 DIAGNOSIS — Z9889 Other specified postprocedural states: Secondary | ICD-10-CM | POA: Insufficient documentation

## 2018-09-18 HISTORY — DX: Acquired absence of right leg below knee: Z89.511

## 2018-09-18 LAB — BASIC METABOLIC PANEL
Anion gap: 9 (ref 5–15)
BUN: 35 mg/dL — ABNORMAL HIGH (ref 8–23)
CO2: 24 mmol/L (ref 22–32)
Calcium: 8 mg/dL — ABNORMAL LOW (ref 8.9–10.3)
Chloride: 99 mmol/L (ref 98–111)
Creatinine, Ser: 3.34 mg/dL — ABNORMAL HIGH (ref 0.61–1.24)
GFR calc Af Amer: 21 mL/min — ABNORMAL LOW (ref >60)
GFR calc non Af Amer: 18 mL/min — ABNORMAL LOW (ref >60)
Glucose, Bld: 155 mg/dL — ABNORMAL HIGH (ref 70–99)
Potassium: 3.4 mmol/L — ABNORMAL LOW (ref 3.5–5.1)
Sodium: 132 mmol/L — ABNORMAL LOW (ref 135–145)

## 2018-09-18 LAB — GLUCOSE, CAPILLARY
Glucose-Capillary: 132 mg/dL — ABNORMAL HIGH (ref 70–99)
Glucose-Capillary: 151 mg/dL — ABNORMAL HIGH (ref 70–99)

## 2018-09-18 MED ORDER — FUROSEMIDE 10 MG/ML IJ SOLN: 20.0000 mg | Freq: Once | INTRAMUSCULAR | Status: DC

## 2018-09-18 MED ORDER — PHENYLEPHRINE HCL 0.5 % NA SOLN
2.0000 [drp] | Freq: Four times a day (QID) | NASAL | Status: DC | PRN
  Administered 2018-09-18: 01:00:00 2 [drp] via NASAL
  Filled 2018-09-18: qty 15

## 2018-09-18 MED ORDER — ISOSORB DINITRATE-HYDRALAZINE 20-37.5 MG PO TABS: 2.0000 | ORAL_TABLET | Freq: Three times a day (TID) | ORAL | 1 refills | Status: DC

## 2018-09-18 MED ORDER — AMLODIPINE BESYLATE 10 MG PO TABS: 10.0000 mg | ORAL_TABLET | Freq: Every day | ORAL | 1 refills | Status: DC

## 2018-09-18 MED ORDER — IVABRADINE HCL 5 MG PO TABS: 2.5000 mg | ORAL_TABLET | Freq: Two times a day (BID) | ORAL | Status: DC

## 2018-09-18 NOTE — Care Management Note (Signed)
Case Management Note  Patient Details  Name: [name] MRN: [id number] Date of Birth: July 29, 1956  Subjective/Objective:                    Action/Plan:   Expected Discharge Date:  09/18/18               Expected Discharge Plan:  Home/Self Care  In-House Referral:     Discharge planning Services  CM Consult, Medication Assistance  Post Acute Care Choice:    Choice offered to:     DME Arranged:    DME Agency:     HH Arranged:    [place] Agency:     Status of Service:  Completed, signed off  If discussed at [employer] of Stay Meetings, dates discussed:    Additional Comments: Per [place]/ [employer] [facility] P#[phone] Isosorbide-hydralazine (Bidil) 20-37.5mg  2 tabs three times a day is covered cost to Patient is $35.00 at  local pharmacy no prior auth required  Kerin Salen 09/18/2018, 2:34 PM

## 2018-09-18 NOTE — Care Management Note (Addendum)
Case Management Note  Patient Details  Name: [name] MRN: [id number] Date of Birth: 07/20/1956  Subjective/Objective:     From home, s/p cath, will be on plavix, benefit check in process for bidil. Patient 's cell is [phone].                 Action/Plan: Benefit check in process for bidil.  Expected Discharge Date:  09/18/18               Expected Discharge Plan:  Home/Self Care  In-House Referral:     Discharge planning Services  CM Consult, Medication Assistance  Post Acute Care Choice:    Choice offered to:     DME Arranged:    DME Agency:     HH Arranged:    HH Agency:     Status of Service:  Completed, signed off  If discussed at [employer] of Stay Meetings, dates discussed:    Additional Comments:  [name], RN 09/18/2018, 10:03 AM

## 2018-09-18 NOTE — Discharge Summary (Signed)
Physician Discharge Summary  Patient ID: [name] MRN: [id number] DOB/AGE: 16-Jan-1956 63 y.o.  Admit date: 09/16/2018 Discharge date: 09/18/2018  Admission Diagnoses: Chest pain  Discharge Diagnoses:  Active Problems:   Unstable angina (HCC)   Essential hypertension   Coronary artery disease   Type 2 diabetes mellitus (HCC)   Chronic kidney disease (CKD), stage IV (severe) (HCC)   Peripheral artery disease (HCC)   Discharged Condition: stable  Hospital Course:  63 y/o Caucasian male with CAD s/p prox to distal RCA stents in 2016, PAD s/p Rt carotid endarterectoy 2012, s/p Rt BKA, toe amputations lf foot due to diabetic neuropathy & PAD, uncontrolled DM, CKD stage III/IV, OSA on CPAP, chronic back pain, prior spinal surgery, admitted with chest discomfort thought to be unstable angina. He is [facility] to [facility] coronary angiography.   Cath showed patent RCA stents and otherwise nonobstructive CAD.Patient was started on nitroglycerin drip due to SBP 220 mmHg and elevated LVEDP 30 mmHg. Blood pressure medications were hanged from Nitrate patch to Bidil 2 tab tid and amlodiopine 10 mg daily. BP much better controlled now. Will check follow up BMP before discharge.  In absence of bleeding diathesis, reasonable to continue baseline DAPT in light of severe ASCVD.   Follow up with [name] on 10/03/2018.   Consults:  None  Significant Diagnostic Studies: Results for Harold, [name] (MRN [id number]) as of 09/18/2018 08:23  Ref. Range 09/16/2018 08:56 09/16/2018 16:33 09/16/2018 18:06 09/17/2018 00:24  Mean Plasma Glucose Latest Units: mg/dL  116.89    Creatinine Latest Ref Range: 0.61 - 1.24 mg/dL   2.11 (H)   GFR, Est Non African American Latest Ref Range: >60 mL/min   32 (L)   GFR, Est African American Latest Ref Range: >60 mL/min   37 (L)   Troponin I Latest Ref Range: <0.03 ng/mL  <0.03 <0.03 0.03 (HH)  Troponin i, poc Latest Ref Range: 0.00 - 0.08 ng/mL 0.03      Results for Harold, [name] (MRN [id number]) as of 09/18/2018 08:23  Ref. Range 09/16/2018 01:29 09/16/2018 18:06  WBC Latest Ref Range: 4.0 - 10.5 K/uL 7.3 9.6  RBC Latest Ref Range: 4.22 - 5.81 MIL/uL 3.53 (L) 3.89 (L)  Hemoglobin Latest Ref Range: 13.0 - 17.0 g/dL 10.0 (L) 11.2 (L)  HCT Latest Ref Range: 39.0 - 52.0 % 30.2 (L) 32.7 (L)  MCV Latest Ref Range: 80.0 - 100.0 fL 85.6 84.1  MCH Latest Ref Range: 26.0 - 34.0 pg 28.3 28.8  MCHC Latest Ref Range: 30.0 - 36.0 g/dL 33.1 34.3  RDW Latest Ref Range: 11.5 - 15.5 % 13.2 13.2  Platelets Latest Ref Range: 150 - 400 K/uL 181 185  nRBC Latest Ref Range: 0.0 - 0.2 % 0.0 0.0   Hospital echocardiogram 09/17/2018: - Left ventricle: The cavity size was normal. There was moderate   concentric hypertrophy. Systolic function was normal. The   estimated ejection fraction was in the range of 55% to 60%.   Features are consistent with a pseudonormal left ventricular   filling pattern, with concomitant abnormal relaxation and   increased filling pressure (grade 2 diastolic dysfunction).   Doppler parameters are consistent with both elevated ventricular   end-diastolic filling pressure and elevated left atrial filling   pressure. - Mitral valve: Calcified annulus. - Left atrium: The atrium was mildly dilated. - Inferior vena cava: The vessel was dilated. The respirophasic   diameter changes was normal, may suggest elevated central venous   pressure.  Cath  09/16/2018: LM: Normal LCx: Normal LAD: High Diag 1 40-50% disease LCx: Normal RCA: Patent prox to distal RCA stents. Mild 20% prox late lumen loss LVEDP 32 mmHg  Conclusion: Nonobstructive CAD with patent RCA stents Elevated LVEDP 32 mmHg Hypertensive emergency.  Treatments: BP management as above.  Discharge Exam: Blood pressure 140/66, pulse (!) 55, temperature 98.1 F (36.7 C), temperature source Oral, resp. rate 17, height 6\' 2"  (1.88 m), weight 112.8 kg, SpO2 96 %. Constitutional: He  appearswell-developedand well-nourished. Mildly obese in no acute distress. Eyes:Conjunctivaeare normal.  Neck:Neck supple.No JVDpresent.  Cardiovascular:Normal rate,regular rhythmand normal heart sounds. No murmurheard. Vascular exam: Faint bilateral carotid bruit present. Femoral pulses are normal. Absent left popliteal pulse and pedal pulse. Right below-knee amputation. Stump is healthy. 2+ left leg edema, pitting below the knee. Pulmonary/Chest:Effort normaland breath sounds normal.  Abdominal:Soft.Bowel sounds are normal.  Musculoskeletal:Normal range of motion.  General: Edemapresent, left leg  1 plus.   Disposition: Discharge disposition: 01-Home or Self Care       Discharge Instructions    Diet - low sodium heart healthy   Complete by:  As directed    Increase activity slowly   Complete by:  As directed      Allergies as of 09/18/2018      Reactions   Contrast Media [iodinated Diagnostic Agents]    Difficulty breathing      Medication List    STOP taking these medications   isosorbide mononitrate 120 MG 24 hr tablet Commonly known as:  IMDUR     TAKE these medications   amLODipine 10 MG tablet Commonly known as:  NORVASC Take 1 tablet (10 mg total) by mouth daily.   aspirin EC 81 MG tablet Take 81 mg by mouth daily.   atorvastatin 40 MG tablet Commonly known as:  LIPITOR Take 40 mg by mouth daily.   BASAGLAR KWIKPEN 100 UNIT/ML Sopn Inject 60 Units into the skin at bedtime.   carvedilol 25 MG tablet Commonly known as:  COREG Take 37.5 mg by mouth 2 (two) times daily with a meal.   clopidogrel 75 MG tablet Commonly known as:  PLAVIX Take 75 mg by mouth daily.   clotrimazole-betamethasone cream Commonly known as:  LOTRISONE Apply 1 application topically as needed (on affected areas on skin).   collagenase ointment Commonly known as:  SANTYL Apply 1 application topically daily as needed (on affected areas of  skin).   fentaNYL 50 MCG/HR Commonly known as:  DURAGESIC - dosed mcg/hr Place 50 mcg onto the skin every 3 (three) days.   ferrous sulfate 325 (65 FE) MG tablet Take 325 mg by mouth daily with breakfast.   fluticasone 0.05 % cream Commonly known as:  CUTIVATE Apply 1 application topically 2 (two) times daily as needed (on affected areas on skin).   folic acid 222 MCG tablet Commonly known as:  FOLVITE Take 400 mcg by mouth daily.   GLUCOSAMINE PO Take 4,000 mg by mouth daily.   insulin lispro 100 UNIT/ML injection Commonly known as:  HUMALOG Inject 13 Units into the skin 3 (three) times daily before meals.   isosorbide-hydrALAZINE 20-37.5 MG tablet Commonly known as:  BIDIL Take 2 tablets by mouth 3 (three) times daily.   ketoconazole 2 % cream Commonly known as:  NIZORAL Apply 1 application topically daily as needed for irritation.   LINZESS 145 MCG Caps capsule Generic drug:  linaclotide Take 145 mcg by mouth daily before breakfast.   metFORMIN 1000 MG tablet Commonly  known as:  GLUCOPHAGE Take 500 mg by mouth 2 (two) times daily with a meal.   multivitamin tablet Take 1 tablet by mouth daily.   nitroGLYCERIN 0.4 MG SL tablet Commonly known as:  NITROSTAT Place 0.4 mg under the tongue every 5 (five) minutes as needed for chest pain. What changed:  Another medication with the same name was removed. Continue taking this medication, and follow the directions you see here.   omega-3 acid ethyl esters 1 g capsule Commonly known as:  LOVAZA Take 2 g by mouth 2 (two) times daily.   ondansetron 4 MG tablet Commonly known as:  ZOFRAN Take 4 mg by mouth every 6 (six) hours as needed for nausea or vomiting.   oxyCODONE-acetaminophen 5-325 MG tablet Commonly known as:  PERCOCET/ROXICET Take 1 tablet by mouth 3 (three) times daily.   pantoprazole 40 MG tablet Commonly known as:  PROTONIX Take 40 mg by mouth daily.   pregabalin 100 MG capsule Commonly known as:   LYRICA Take 100 mg by mouth 3 (three) times daily.   REFRESH 1.4-0.6 % Soln Generic drug:  Polyvinyl Alcohol-Povidone PF Place 1 drop into both eyes daily.   rOPINIRole 2 MG tablet Commonly known as:  REQUIP Take 2 mg by mouth at bedtime.   saccharomyces boulardii 250 MG capsule Commonly known as:  FLORASTOR Take 250 mg by mouth daily.   tamsulosin 0.4 MG Caps capsule Commonly known as:  FLOMAX Take 0.4 mg by mouth daily.   torsemide 20 MG tablet Commonly known as:  DEMADEX Take 20 mg by mouth daily. May take an additional tablet for increased swelling.      Follow-up Information    Adrian Prows, MD Follow up on 10/03/2018.   Specialty:  Cardiology Why:  1:00 PM Contact information: [place] [address] [phone]           Signed: Nigel Mormon 09/18/2018, 8:20 AM  Nigel Mormon, MD [facility] Pager: [phone] Office: [phone] If no answer Cell [phone]

## 2018-09-19 ENCOUNTER — Encounter (INDEPENDENT_AMBULATORY_CARE_PROVIDER_SITE_OTHER): Payer: Self-pay | Admitting: Family

## 2018-09-19 DIAGNOSIS — G894 Chronic pain syndrome: Secondary | ICD-10-CM | POA: Diagnosis not present

## 2018-09-19 DIAGNOSIS — E1151 Type 2 diabetes mellitus with diabetic peripheral angiopathy without gangrene: Secondary | ICD-10-CM | POA: Diagnosis not present

## 2018-09-19 DIAGNOSIS — I25118 Atherosclerotic heart disease of native coronary artery with other forms of angina pectoris: Secondary | ICD-10-CM | POA: Diagnosis not present

## 2018-09-30 DIAGNOSIS — H34831 Tributary (branch) retinal vein occlusion, right eye, with macular edema: Secondary | ICD-10-CM | POA: Diagnosis not present

## 2018-09-30 DIAGNOSIS — H348322 Tributary (branch) retinal vein occlusion, left eye, stable: Secondary | ICD-10-CM | POA: Diagnosis not present

## 2018-09-30 DIAGNOSIS — H35351 Cystoid macular degeneration, right eye: Secondary | ICD-10-CM | POA: Diagnosis not present

## 2018-10-03 DIAGNOSIS — I25119 Atherosclerotic heart disease of native coronary artery with unspecified angina pectoris: Secondary | ICD-10-CM | POA: Diagnosis not present

## 2018-10-03 DIAGNOSIS — I453 Trifascicular block: Secondary | ICD-10-CM | POA: Diagnosis not present

## 2018-10-03 DIAGNOSIS — I10 Essential (primary) hypertension: Secondary | ICD-10-CM | POA: Diagnosis not present

## 2018-10-09 DIAGNOSIS — H34831 Tributary (branch) retinal vein occlusion, right eye, with macular edema: Secondary | ICD-10-CM | POA: Diagnosis not present

## 2018-10-10 DIAGNOSIS — E11319 Type 2 diabetes mellitus with unspecified diabetic retinopathy without macular edema: Secondary | ICD-10-CM | POA: Diagnosis not present

## 2018-10-20 DIAGNOSIS — G894 Chronic pain syndrome: Secondary | ICD-10-CM | POA: Diagnosis not present

## 2018-10-20 DIAGNOSIS — E1140 Type 2 diabetes mellitus with diabetic neuropathy, unspecified: Secondary | ICD-10-CM | POA: Diagnosis not present

## 2018-10-20 DIAGNOSIS — I25118 Atherosclerotic heart disease of native coronary artery with other forms of angina pectoris: Secondary | ICD-10-CM | POA: Diagnosis not present

## 2018-11-13 DIAGNOSIS — H35351 Cystoid macular degeneration, right eye: Secondary | ICD-10-CM | POA: Diagnosis not present

## 2018-11-13 DIAGNOSIS — H34831 Tributary (branch) retinal vein occlusion, right eye, with macular edema: Secondary | ICD-10-CM | POA: Diagnosis not present

## 2018-11-13 DIAGNOSIS — H348322 Tributary (branch) retinal vein occlusion, left eye, stable: Secondary | ICD-10-CM | POA: Diagnosis not present

## 2018-11-14 ENCOUNTER — Encounter: Payer: Self-pay | Admitting: Cardiology

## 2018-11-14 ENCOUNTER — Ambulatory Visit (INDEPENDENT_AMBULATORY_CARE_PROVIDER_SITE_OTHER): Payer: 59 | Admitting: Cardiology

## 2018-11-14 VITALS — BP 128/63 | HR 64 | Ht 74.0 in | Wt 257.3 lb

## 2018-11-14 DIAGNOSIS — I200 Unstable angina: Secondary | ICD-10-CM | POA: Diagnosis not present

## 2018-11-14 DIAGNOSIS — I209 Angina pectoris, unspecified: Secondary | ICD-10-CM | POA: Diagnosis not present

## 2018-11-14 DIAGNOSIS — I739 Peripheral vascular disease, unspecified: Secondary | ICD-10-CM | POA: Diagnosis not present

## 2018-11-14 DIAGNOSIS — M48062 Spinal stenosis, lumbar region with neurogenic claudication: Secondary | ICD-10-CM

## 2018-11-14 DIAGNOSIS — E1159 Type 2 diabetes mellitus with other circulatory complications: Secondary | ICD-10-CM

## 2018-11-14 DIAGNOSIS — I453 Trifascicular block: Secondary | ICD-10-CM

## 2018-11-14 DIAGNOSIS — I10 Essential (primary) hypertension: Secondary | ICD-10-CM

## 2018-11-14 DIAGNOSIS — I5032 Chronic diastolic (congestive) heart failure: Secondary | ICD-10-CM

## 2018-11-14 DIAGNOSIS — I25118 Atherosclerotic heart disease of native coronary artery with other forms of angina pectoris: Secondary | ICD-10-CM | POA: Diagnosis not present

## 2018-11-14 NOTE — Progress Notes (Addendum)
Subjective:  Primary Physician:  [name], PA-C  Patient ID: [name], male    DOB: 1956-07-17, 63 y.o.   MRN: [id number]  Chief Complaint  Patient presents with  . Hypertension  . Coronary Artery Disease  . Follow-up    6wk    HPI: SYED ZUKAS  is a 63 y.o. male  with severe peripheral arterial disease and has history of right below-knee amputation and toe lamputations on the left, multiple peripheral interventions in the past, uncontrolled diabetes mellitus, stage III CKD, coronary artery disease angioplasty to the right coronary artery on 10/29/2014, right carotid endarterectomy in 2012 and follows vascular surgery for PAD. Unable to tolerate CPAP, chronic back pain and neck and back surgery in past and on chronic pain medications. Admitted to [facility] with intra-abdominal abscess on 11/04/2017 requiring colostomy; however, had reversal in Sept 2019.  Coronary Angiography 09/17/2018 revealed widely patent stent and no significant new disease. He was also in acute diastolic heart failure and felt to be his symptoms related to hypertensive urgency. He now presents for f/u of chest pain and dyspnea and hypertension. States since being on BiDil, he has noticed significant improvement in blood pressure control.  He is also noticed improvement in dyspnea.  He complains of weakness in his left leg when he has to walk a lot and is wondering if blockages of come back.  No ulceration.  Continues to use e-cigarettes.  Has chronic back pain and is on chronic fentanyl patch. Past Medical History:  Diagnosis Date  . Carotid artery occlusion 11/10/10   LEFT CAROTID ENDARTERECTOMY  . Complication of anesthesia    BP WENT UP AT [facility] "  . COPD (chronic obstructive pulmonary disease) ([place])   . Diverticulitis   . Diverticulosis of colon (without mention of hemorrhage)   . DJD (degenerative joint disease)   . Fatty liver   . Full dentures   . GERD (gastroesophageal reflux disease)    . H/O hiatal hernia   . Hyperlipidemia   . Hypertension   . Neuromuscular disorder ([place])    peripheral neuropathy  . Non-pressure chronic ulcer of other part of left foot limited to breakdown of skin ([place]) 11/12/2016  . Osteomyelitis ([place])    left 5th metatarsal  . PAD (peripheral artery disease) ([place])    Distal aortogram June 2012. Atherectomy left popliteal artery July 2012.   . Pseudoclaudication 11/15/2018  . Slurred speech    AS PER WIFE IN D/C NOTE 11/10/10  . 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 Unstable angina ([place]) 09/16/2018  . Wears glasses     Past Surgical History:  Procedure Laterality Date  . AMPUTATION  11/05/2011   Procedure: AMPUTATION RAY;  Surgeon: Wylene Simmer, MD;  Location: [place];  Service: Orthopedics;  Laterality: Right;  Amputation of Right 4&5th Toes  . AMPUTATION Left 11/26/2012   Procedure: AMPUTATION RAY;  Surgeon: Wylene Simmer, MD;  Location: [place];  Service: Orthopedics;  Laterality: Left;  fourth ray amputation  . AMPUTATION Right 08/27/2014   Procedure: Transmetatarsal Amputation;  Surgeon: Newt Minion, MD;  Location: [place];  Service: Orthopedics;  Laterality: Right;  . AMPUTATION Right 01/14/2015   Procedure: AMPUTATION BELOW KNEE;  Surgeon: Newt Minion, MD;  Location: [place];  Service: Orthopedics;  Laterality: Right;  . AMPUTATION Left 10/21/2015   Procedure: Left Foot 5th Ray Amputation;  Surgeon: Newt Minion, MD;  Location: [place];  Service: Orthopedics;  Laterality: Left;  .  ANTERIOR FUSION CERVICAL SPINE  02/06/06   C4-5, C5-6, C6-7; SURGEON DR. MAX COHEN  . BACK SURGERY    . BELOW KNEE LEG AMPUTATION Right   . CARDIAC CATHETERIZATION  10/31/04   2009  . CAROTID ENDARTERECTOMY  11/10/10  . CAROTID ENDARTERECTOMY Left 11/10/2010   Subtotal occlusion of left internal carotid artery with left hemispheric transient ischemic attacks.  . CAROTID STENT    . CARPAL TUNNEL RELEASE Right 10/21/2013   Procedure: RIGHT CARPAL TUNNEL RELEASE;  Surgeon: Wynonia Sours,  MD;  Location: [place];  Service: Orthopedics;  Laterality: Right;  . CHOLECYSTECTOMY    . COLON SURGERY    . COLONOSCOPY    . COLOSTOMY REVERSAL  05/21/2018   ileostomy reversal  . CYSTOSCOPY WITH STENT PLACEMENT Bilateral 01/13/2018   Procedure: CYSTOSCOPY WITH BILATERAL URETERAL CATHETER PLACEMENT;  Surgeon: Ardis Hughs, MD;  Location: [facility];  Service: Urology;  Laterality: Bilateral;  . ESOPHAGEAL MANOMETRY Bilateral 07/19/2014   Procedure: ESOPHAGEAL MANOMETRY (EM);  Surgeon: Jerene Bears, MD;  [facility];  Service: Gastroenterology;  Laterality: Bilateral;  . FEMORAL ARTERY STENT     x6  . FINGER SURGERY    . FOOT SURGERY  04/25/2016    EXCISION BASE 5TH METATARSAL AND PARTIAL CUBOID LEFT FOOT  . HERNIA REPAIR     LEFT INGUINAL AND UMBILICAL REPAIRS  . HERNIA REPAIR    . I&D EXTREMITY Left 04/25/2016   Procedure: EXCISION BASE 5TH METATARSAL AND PARTIAL CUBOID LEFT FOOT;  Surgeon: Newt Minion, MD;  Location: [place];  Service: Orthopedics;  Laterality: Left;  . ILEOSTOMY  01/13/2018   Procedure: ILEOSTOMY;  Surgeon: Clovis Riley, MD;  Location[facility];  Service: General;;  . ILEOSTOMY CLOSURE N/A 05/21/2018   Procedure: ILEOSTOMY REVERSAL ERAS PATHWAY;  Surgeon: Clovis Riley, MD;  Location: [place];  Service: General;  Laterality: N/A;  . IR RADIOLOGIST EVAL & MGMT  11/19/2017  . IR RADIOLOGIST EVAL & MGMT  12/03/2017  . IR RADIOLOGIST EVAL & MGMT  12/18/2017  . JOINT REPLACEMENT Right 2001   Total  . LAMINECTOMY     X 3 LUMBAR AND X 2 CERVICAL SPINE OPERATIONS  . LAPAROSCOPIC CHOLECYSTECTOMY W/ CHOLANGIOGRAPHY  11/09/04   SURGEON DR. Luella Cook  . LEFT HEART CATH AND CORONARY ANGIOGRAPHY N/A 09/16/2018   Procedure: LEFT HEART CATH AND CORONARY ANGIOGRAPHY;  Surgeon: Nigel Mormon, MD;  Location: [place] [facility];  Service: Cardiovascular;  Laterality: N/A;  . LEFT HEART CATHETERIZATION WITH CORONARY ANGIOGRAM N/A 10/29/2014    Procedure: LEFT HEART CATHETERIZATION WITH CORONARY ANGIOGRAM;  Surgeon: Laverda Page, MD;  Location[facility] C[facility] LAB;  Service: Cardiovascular;  Laterality: N/A;  . LOWER EXTREMITY ANGIOGRAM N/A 03/19/2012   Procedure: LOWER EXTREMITY ANGIOGRAM;  Surgeon: Burnell Blanks, MD;  Location[facility] C[facility] LAB;  Service: Cardiovascular;  Laterality: N/A;  . NECK SURGERY    . PARTIAL COLECTOMY N/A 01/13/2018   Procedure: LAPAROSCOPIC ASSISTED   SIGMOID COLECTOMY ILEOSTOMY;  Surgeon: Clovis Riley, MD;  [facility];  Service: General;  Laterality: N/A;  . PENILE PROSTHESIS IMPLANT  08/14/05   INFRAPUBIC INSERTION OF INFLATABLE PENILE PROSTHESIS; SURGEON DR. Amalia Hailey  . PENILE PROSTHESIS IMPLANT    . PERCUTANEOUS CORONARY STENT INTERVENTION (PCI-S) Right 10/29/2014   Procedure: PERCUTANEOUS CORONARY STENT INTERVENTION (PCI-S);  Surgeon: Laverda Page, MD;  Location: [facility] CATH LAB;  Service: Cardiovascular;  Laterality: Right;  . SHOULDER  ARTHROSCOPY    . SPINE SURGERY    . TOE AMPUTATION Left   . TONSILLECTOMY    . TOTAL KNEE ARTHROPLASTY  07/2002   RIGHT KNEE ; SURGEON  DR. [name] ALSO HAD ARTHROSCOPIC RIGHT KNEE IN  10/2001  . TOTAL KNEE ARTHROPLASTY    . ULNAR NERVE TRANSPOSITION Right 10/21/2013   Procedure: RIGHT ELBOW  ULNAR NERVE DECOMPRESSION;  Surgeon: Wynonia Sours, MD;  Location: [place];  Service: Orthopedics;  Laterality: Right;    Social History   Socioeconomic History  . Marital status: Married    Spouse name: Not on file  . Number of children: 3  . Years of education: Not on file  . Highest education level: Not on file  Occupational History  . Occupation: Magazine features editor: UNEMPLOYED  Social Needs  . Financial resource strain: Not on file  . Food insecurity:    Worry: Not on file    Inability: Not on file  . Transportation needs:    Medical: Not on file    Non-medical: Not on file  Tobacco Use  . Smoking status: Former Smoker     Packs/day: 2.00    Years: 35.00    Pack years: 70.00    Types: Cigarettes    Last attempt to quit: 10/28/2011    Years since quitting: 7.0  . Smokeless tobacco: Never Used  Substance and Sexual Activity  . Alcohol use: Not Currently    Comment: "not in a long time"  . Drug use: Not Currently  . Sexual activity: Yes    Birth control/protection: Implant  Lifestyle  . Physical activity:    Days per week: Not on file    Minutes per session: Not on file  . Stress: Not on file  Relationships  . Social connections:    Talks on phone: Not on file    Gets together: Not on file    Attends religious service: Not on file    Active member of club or organization: Not on file    Attends meetings of clubs or organizations: Not on file    Relationship status: Not on file  . Intimate partner violence:    Fear of current or ex partner: Not on file    Emotionally abused: Not on file    Physically abused: Not on file    Forced sexual activity: Not on file  Other Topics Concern  . Not on file  Social History Narrative   ** Merged History Encounter **       HEAVY SMOKER AND CONTINUES TO SMOKE 1 PPD. DOES NOT EXERCISE REGULARLY.  Wife [phone] Lebron Quam). Has 2 sons and daughter. Still active        Current Outpatient Medications on File Prior to Visit  Medication Sig Dispense Refill  . amLODipine (NORVASC) 10 MG tablet Take 1 tablet (10 mg total) by mouth daily. 30 tablet 1  . aspirin EC 81 MG tablet Take 81 mg by mouth daily.    Marland Kitchen atorvastatin (LIPITOR) 40 MG tablet Take 40 mg by mouth daily.    . carvedilol (COREG) 25 MG tablet Take 1.5 tablets (37.5 mg total) by mouth 2 (two) times daily. 270 tablet 2  . clobetasol (TEMOVATE) 0.05 % external solution Apply 1 application topically daily as needed (dry scalp).    . clopidogrel (PLAVIX) 75 MG tablet Take 75 mg by mouth daily.     . clotrimazole-betamethasone (LOTRISONE) cream Apply 1 application topically as  needed (on affected  areas on skin).    . collagenase (SANTYL) ointment Apply topically daily. (Patient taking differently: Apply 1 application topically daily as needed (wound care). TO AFFECTED SITE(S)) 15 g 0  . fentaNYL (DURAGESIC - DOSED MCG/HR) 50 MCG/HR Place 50 mcg onto the skin every 3 (three) days.    . ferrous sulfate 325 (65 FE) MG tablet Take 325 mg by mouth daily with breakfast.    . fluticasone (CUTIVATE) 0.05 % cream Apply 1 application topically 2 (two) times daily as needed (dry skin on face).     . folic acid (FOLVITE) 626 MCG tablet Take 800 mcg by mouth daily.     . Glucosamine HCl (GLUCOSAMINE PO) Take 2,000 mg by mouth daily.     Marland Kitchen HUMALOG KWIKPEN 100 UNIT/ML KiwkPen Inject 15 Units into the skin 3 (three) times daily with meals. 15 units with breakfast, lunch, & dinner-- pt may increase dose by 1 unit if sugar is elevated  0  . Insulin Glargine (BASAGLAR KWIKPEN) 100 UNIT/ML SOPN Inject 60 Units into the skin at bedtime.    . isosorbide-hydrALAZINE (BIDIL) 20-37.5 MG tablet Take 2 tablets by mouth 3 (three) times daily. (Patient taking differently: Take 1 tablet by mouth 3 (three) times daily. ) 90 tablet 1  . ketoconazole (NIZORAL) 2 % cream Apply 1 application topically 2 (two) times daily.  1  . Levocetirizine Dihydrochloride (XYZAL ALLERGY 24HR PO) Take by mouth daily.    Marland Kitchen linaclotide (LINZESS) 145 MCG CAPS capsule Take 145 mcg by mouth daily before breakfast.    . metFORMIN (GLUCOPHAGE) 1000 MG tablet Take 500 mg by mouth 2 (two) times daily with a meal.    . Multiple Vitamin (MULTIVITAMIN) tablet Take 1 tablet by mouth daily.    Marland Kitchen omega-3 acid ethyl esters (LOVAZA) 1 g capsule Take 2 g by mouth 2 (two) times daily.    . ondansetron (ZOFRAN) 4 MG tablet Take 4 mg by mouth every 6 (six) hours as needed for nausea or vomiting.    Marland Kitchen oxyCODONE-acetaminophen (PERCOCET/ROXICET) 5-325 MG tablet Take 1 tablet by mouth every 8 (eight) hours as needed. (Patient taking differently: Take 1 tablet by  mouth 3 (three) times daily. ) 15 tablet 0  . oxyCODONE-acetaminophen (PERCOCET/ROXICET) 5-325 MG tablet Take 1 tablet by mouth 3 (three) times daily.    . pantoprazole (PROTONIX) 40 MG tablet Take 40 mg by mouth daily.    . Polyvinyl Alcohol-Povidone PF (REFRESH) 1.4-0.6 % SOLN Place 1 drop into both eyes daily.    . pregabalin (LYRICA) 100 MG capsule Take 1 capsule (100 mg total) by mouth 3 (three) times daily. 30 capsule 0  . rOPINIRole (REQUIP) 2 MG tablet Take 2 mg by mouth at bedtime.     . saccharomyces boulardii (FLORASTOR) 250 MG capsule Take 250 mg by mouth daily.    . tamsulosin (FLOMAX) 0.4 MG CAPS capsule Take 0.4 mg by mouth daily.    Marland Kitchen torsemide (DEMADEX) 20 MG tablet Take 20 mg by mouth daily. May take an additional tablet for increased swelling.    . Gauze Pads & Dressings (GAUZE DRESSING) 4"X4" PADS 1 each by Does not apply route daily. 30 each 2  . NEEDLE, REUSABLE, 22 G 22G X 1-1/2" MISC 1 Units by Does not apply route as directed. For B12 IM inj 10 each 0  . Syringe/Needle, Disp, (SYRINGE 3CC/22GX1-1/2") 22G X 1-1/2" 3 ML MISC 1 Syringe by Does not apply route as directed. For b12  IM inj 10 each 0   No current facility-administered medications on file prior to visit.    Review of Systems  Constitutional: Positive for malaise/fatigue. Negative for weight loss.  Respiratory: Negative for cough, hemoptysis and shortness of breath.   Cardiovascular: Positive for claudication and leg swelling. Negative for chest pain and palpitations.  Gastrointestinal: Negative for abdominal pain, blood in stool, constipation, heartburn and vomiting.  Genitourinary: Negative for dysuria.  Musculoskeletal: Positive for back pain and joint pain. Negative for myalgias.       Right below knee amputation  Neurological: Positive for tingling. Negative for dizziness, focal weakness and headaches.  Endo/Heme/Allergies: Does not bruise/bleed easily.  Psychiatric/Behavioral: Negative for depression.  The patient is not nervous/anxious.   All other systems reviewed and are negative.     Objective:  Blood pressure 128/63, pulse 64, height 6' 2"  (1.88 m), weight 257 lb 4.8 oz (116.7 kg), SpO2 97 %. Body mass index is 33.04 kg/m.  Physical Exam  Constitutional: He appears well-developed and well-nourished. No distress.  HENT:  Head: Atraumatic.  Eyes: Conjunctivae are normal.  Neck: Neck supple. No JVD present. No thyromegaly present.  Cardiovascular: Normal rate, regular rhythm, normal heart sounds and intact distal pulses. Exam reveals no gallop.  No murmur heard. Pulses:      Carotid pulses are on the right side with bruit and on the left side with bruit.      Femoral pulses are 1+ on the right side with bruit and 1+ on the left side with bruit.      Popliteal pulses are 2+ on the right side and 2+ on the left side.       Dorsalis pedis pulses are 1+ on the left side. Right dorsalis pedis pulse not accessible.       Posterior tibial pulses are 2+ on the left side. Right posterior tibial pulse not accessible.  Right BKA. There is 2+ left leg pitting edema below knee.   Pulmonary/Chest: Effort normal and breath sounds normal.  Abdominal: Soft. Bowel sounds are normal.  Musculoskeletal: Normal range of motion.  Neurological: He is alert.  Skin: Skin is warm and dry.  Psychiatric: He has a normal mood and affect.    CARDIAC STUDIES:   Coronary angiogram 09/17/18: Widely patent stent stents. Normal LVEF. Coronary Angiogram 10/29/2014: CAD s/p stenting of the distal, mid and proximal RCA with implantation of 3 overlapping drug-eluting stent, from distal to proximal 2.5 x 38 mm, 2.75 x 38 mm and a 2.75 x 20 mm Promus premier DES.  Echocardiogram 09/16/2018:  Left ventricle: The cavity size was normal. There was moderate concentric hypertrophy. Systolic function was normal. The estimated ejection fraction was in the range of 55% to 60%. Features are consistent with a pseudonormal left  ventricular filling pattern, with concomitant abnormal relaxation and increased filling pressure (grade 2 diastolic dysfunction). Doppler parameters are consistent with both elevated ventricular end-diastolic filling pressure and elevated left atrial filling pressure. Mitral valve: Calcified annulus. Left atrium: The atrium was mildly dilated. Inferior vena cava: The vessel was dilated. The respirophasic diameter changes was normal, may suggest elevated central venous pressure.  Lexiscan sestamibi stress test 03/18/2017: 1. The resting electrocardiogram demonstrated normal sinus rhythm, RBBB and no resting arrhythmias. Stress EKG is non-diagnostic for ischemia as it a pharmacologic stress using Lexiscan. Occasional PVC noted. Stress symptoms included dyspnea, dizziness and chest pain. 2. The LV is dilated both at rest and stress images. The LV end diastolic volume was 010OF.  Perfusion images reveal inferior soft tissue attenuation artifact. There is no ischemia or scar. Overall left ventricular systolic function was normal without regional wall motion abnormalities. The left ventricular ejection fraction was calculated to be 51%. This is a low risk study. Compared to the study done on 10/15/2014, inferior wall ischemia and associated inferior hypokinesis is no longer present.  Carotid artery duplex in 03/01/2017: Right ICA less than 40% stenosis, greater than 50% stenosis of right ECA. Left ICA endarterectomy widely patent - Vascular f/u. Victorino Dike.  Left popliteal artery atherectomy in June 2012 and left below knee PTA 2014 by Dr. Brunetta Jeans at [place].   Labs   CMP Latest Ref Rng & Units 09/18/2018 09/16/2018  Glucose 70 - 99 mg/dL 155(H) -  BUN 8 - 23 mg/dL 35(H) -  Creatinine 0.61 - 1.24 mg/dL 3.34(H) 2.11(H)  Sodium 135 - 145 mmol/L 132(L) -  Potassium 3.5 - 5.1 mmol/L 3.4(L) -  Chloride 98 - 111 mmol/L 99 -  CO2 22 - 32 mmol/L 24 -  Calcium 8.9 - 10.3 mg/dL 8.0(L) -  Total Protein 6.5 - 8.1 g/dL - -   Total Bilirubin 0.3 - 1.2 mg/dL - -  Alkaline Phos 38 - 126 U/L - -  AST 15 - 41 U/L - -  ALT 0 - 44 U/L - -   CBC Latest Ref Rng & Units 09/16/2018 09/16/2018  WBC 4.0 - 10.5 K/uL 9.6 7.3  Hemoglobin 13.0 - 17.0 g/dL 11.2(L) 10.0(L)  Hematocrit 39.0 - 52.0 % 32.7(L) 30.2(L)  Platelets 150 - 400 K/uL 185 181    09/25/2017: EGFR 48/56. Cholesterol 158, triglycerides 189, HDL 35, LDL 85. TSH 0.6.   Assessment & Recommendations:   1. Coronary artery disease of native artery of native heart with stable angina pectoris [facility]) Coronary angiogram 09/17/18: Widely patent stent stents. Normal LVEF. 10/29/2014: CAD s/p stenting of the distal, mid and proximal RCA with implantation of 3 overlapping drug-eluting stent, from distal to proximal 2.5 x 38 mm, 2.75 x 38 mm and a 2.75 x 20 mm Promus premier DES.   2. Angina pectoris associated with type 2 diabetes mellitus ([place])  3. Essential hypertension  4. Peripheral artery disease (HCC) History of left carotid endarterectomy, right below-knee amputation, left toe amputation.  Has left popliteal angioplasty history 2012.  5. Chronic diastolic heart failure, NYHA class 2 ([place])  6. Trifascicular block EKG 10/03/2018: Sinus rhythm with first-degree AV block at rate of 60 bpm, left atrial enlargement, left axis deviation, left anterior fascicular block.  Right bundle branch block.  Trifascicular block.  LVH.  No evidence of ischemia. Compared to previous, trifascicular block new.  7. Pseudoclaudication Has spinal stenosis.  8.  Diabetes mellitus with stage III chronic kidney disease  Recommendation:   His blood pressure is now well controlled on the present medical regimen. Benicar was discontinued due to renal insufficiency. I will continued amlodipine 10 mg daily along with BiDil 1 p.o. t.i.d. I'd like to see him back for 6 months for f/u, extremely difficult and complex patient. somehow his prescription from 2 tablets t.i.d. was changed to one  p.o. t.i.d., however blood pressure is now well-controlled.  Hence continue the present dose.  Serum creatinine has improved since Benicar was discontinued.  He has not had any angina pectoris, no clinical evidence of heart failure as well.  He does have trifascicular block on EKG however no symptoms of exercise intolerance or dizziness.  He complains of weakness in his legs  when he ambulates, no significant change in his physical exam, advised him that would perform peripheral arteriogram only for the rest pain or severe lifestyle limiting claudication in view of complex medical history including stage III chronic kidney disease.  He has very mild carotid disease and has history of left carotid endarterectomy.  Adrian Prows, MD, [facility] 11/15/2018, 10:25 AM [facility]. [place] Pager: [phone] Office: [phone] If no answer Cell [phone]

## 2018-11-15 ENCOUNTER — Encounter: Payer: Self-pay | Admitting: Cardiology

## 2018-11-15 DIAGNOSIS — I453 Trifascicular block: Secondary | ICD-10-CM | POA: Insufficient documentation

## 2018-11-15 DIAGNOSIS — M48062 Spinal stenosis, lumbar region with neurogenic claudication: Secondary | ICD-10-CM | POA: Insufficient documentation

## 2018-11-15 HISTORY — DX: Spinal stenosis, lumbar region with neurogenic claudication: M48.062

## 2018-11-15 HISTORY — DX: Trifascicular block: I45.3

## 2018-11-17 DIAGNOSIS — Z0000 Encounter for general adult medical examination without abnormal findings: Secondary | ICD-10-CM | POA: Diagnosis not present

## 2018-11-17 DIAGNOSIS — E785 Hyperlipidemia, unspecified: Secondary | ICD-10-CM | POA: Diagnosis not present

## 2018-11-18 DIAGNOSIS — E1140 Type 2 diabetes mellitus with diabetic neuropathy, unspecified: Secondary | ICD-10-CM | POA: Diagnosis not present

## 2018-11-18 DIAGNOSIS — I25118 Atherosclerotic heart disease of native coronary artery with other forms of angina pectoris: Secondary | ICD-10-CM | POA: Diagnosis not present

## 2018-11-18 DIAGNOSIS — G894 Chronic pain syndrome: Secondary | ICD-10-CM | POA: Diagnosis not present

## 2018-11-18 DIAGNOSIS — N183 Chronic kidney disease, stage 3 (moderate): Secondary | ICD-10-CM | POA: Diagnosis not present

## 2018-11-20 DIAGNOSIS — H25811 Combined forms of age-related cataract, right eye: Secondary | ICD-10-CM | POA: Diagnosis not present

## 2018-11-20 DIAGNOSIS — E113292 Type 2 diabetes mellitus with mild nonproliferative diabetic retinopathy without macular edema, left eye: Secondary | ICD-10-CM | POA: Diagnosis not present

## 2018-11-20 DIAGNOSIS — E113211 Type 2 diabetes mellitus with mild nonproliferative diabetic retinopathy with macular edema, right eye: Secondary | ICD-10-CM | POA: Diagnosis not present

## 2018-11-20 DIAGNOSIS — H2511 Age-related nuclear cataract, right eye: Secondary | ICD-10-CM | POA: Diagnosis not present

## 2018-11-20 DIAGNOSIS — H2512 Age-related nuclear cataract, left eye: Secondary | ICD-10-CM | POA: Diagnosis not present

## 2018-12-18 DIAGNOSIS — I25118 Atherosclerotic heart disease of native coronary artery with other forms of angina pectoris: Secondary | ICD-10-CM | POA: Diagnosis not present

## 2018-12-18 DIAGNOSIS — E1140 Type 2 diabetes mellitus with diabetic neuropathy, unspecified: Secondary | ICD-10-CM | POA: Diagnosis not present

## 2018-12-18 DIAGNOSIS — G894 Chronic pain syndrome: Secondary | ICD-10-CM | POA: Diagnosis not present

## 2018-12-25 DIAGNOSIS — H34831 Tributary (branch) retinal vein occlusion, right eye, with macular edema: Secondary | ICD-10-CM | POA: Diagnosis not present

## 2018-12-25 DIAGNOSIS — H35351 Cystoid macular degeneration, right eye: Secondary | ICD-10-CM | POA: Diagnosis not present

## 2018-12-25 DIAGNOSIS — H348322 Tributary (branch) retinal vein occlusion, left eye, stable: Secondary | ICD-10-CM | POA: Diagnosis not present

## 2018-12-31 ENCOUNTER — Ambulatory Visit: Payer: Self-pay | Admitting: Cardiology

## 2019-01-15 ENCOUNTER — Encounter: Payer: Self-pay | Admitting: Cardiology

## 2019-01-15 DIAGNOSIS — G894 Chronic pain syndrome: Secondary | ICD-10-CM | POA: Diagnosis not present

## 2019-01-15 DIAGNOSIS — E1140 Type 2 diabetes mellitus with diabetic neuropathy, unspecified: Secondary | ICD-10-CM | POA: Diagnosis not present

## 2019-01-15 DIAGNOSIS — N183 Chronic kidney disease, stage 3 (moderate): Secondary | ICD-10-CM | POA: Diagnosis not present

## 2019-01-15 DIAGNOSIS — I25118 Atherosclerotic heart disease of native coronary artery with other forms of angina pectoris: Secondary | ICD-10-CM | POA: Diagnosis not present

## 2019-01-15 NOTE — Progress Notes (Signed)
Pre op note for eye surgery sent.

## 2019-01-16 DIAGNOSIS — H2512 Age-related nuclear cataract, left eye: Secondary | ICD-10-CM | POA: Diagnosis not present

## 2019-01-16 DIAGNOSIS — H25812 Combined forms of age-related cataract, left eye: Secondary | ICD-10-CM | POA: Diagnosis not present

## 2019-01-28 DIAGNOSIS — H34831 Tributary (branch) retinal vein occlusion, right eye, with macular edema: Secondary | ICD-10-CM | POA: Diagnosis not present

## 2019-01-28 DIAGNOSIS — H35351 Cystoid macular degeneration, right eye: Secondary | ICD-10-CM | POA: Diagnosis not present

## 2019-01-28 DIAGNOSIS — H348322 Tributary (branch) retinal vein occlusion, left eye, stable: Secondary | ICD-10-CM | POA: Diagnosis not present

## 2019-02-04 DIAGNOSIS — D631 Anemia in chronic kidney disease: Secondary | ICD-10-CM | POA: Diagnosis not present

## 2019-02-04 DIAGNOSIS — I129 Hypertensive chronic kidney disease with stage 1 through stage 4 chronic kidney disease, or unspecified chronic kidney disease: Secondary | ICD-10-CM | POA: Diagnosis not present

## 2019-02-04 DIAGNOSIS — N183 Chronic kidney disease, stage 3 (moderate): Secondary | ICD-10-CM | POA: Diagnosis not present

## 2019-02-13 ENCOUNTER — Other Ambulatory Visit: Payer: Self-pay | Admitting: Nephrology

## 2019-02-13 DIAGNOSIS — N183 Chronic kidney disease, stage 3 unspecified: Secondary | ICD-10-CM

## 2019-02-24 ENCOUNTER — Ambulatory Visit: Payer: Self-pay

## 2019-02-24 ENCOUNTER — Encounter: Payer: Self-pay | Admitting: Orthopedic Surgery

## 2019-02-24 ENCOUNTER — Other Ambulatory Visit: Payer: Self-pay

## 2019-02-24 ENCOUNTER — Ambulatory Visit (INDEPENDENT_AMBULATORY_CARE_PROVIDER_SITE_OTHER): Payer: 59 | Admitting: Physician Assistant

## 2019-02-24 VITALS — Ht 74.0 in | Wt 257.3 lb

## 2019-02-24 DIAGNOSIS — M7022 Olecranon bursitis, left elbow: Secondary | ICD-10-CM | POA: Diagnosis not present

## 2019-02-24 DIAGNOSIS — M25522 Pain in left elbow: Secondary | ICD-10-CM

## 2019-02-24 DIAGNOSIS — I200 Unstable angina: Secondary | ICD-10-CM

## 2019-02-24 MED ORDER — DOXYCYCLINE HYCLATE 100 MG PO CAPS: 100.0000 mg | ORAL_CAPSULE | Freq: Two times a day (BID) | ORAL | 1 refills | Status: DC

## 2019-02-24 NOTE — Progress Notes (Signed)
Office Visit Note   Patient: [name]           Date of Birth: 1956-01-17           MRN: [id number] Visit Date: 02/24/2019              Requested by: [name], PA-C [address] [place],  [place] [address] PCP: [name], PA-C  Chief Complaint  Patient presents with  . Left Elbow - Pain      HPI: The patient is a 63 year old gentleman who is seen for evaluation of his left elbow.  The patient reports that over the past several weeks he developed redness and soreness and a fluid collection over the left elbow. He reports no history of trauma.  He does report pain with weightbearing over the left elbow.  He reports pain with movement of the elbow.  He is not taking any antibiotics.  He also has a history of a left shoulder rotator cuff tear and was discussing surgery back in January of this year and would like to be considered for surgery again now that there are fewer COVID-19 restrictions.  We discussed that we first need to make sure that he does not have an infection in his elbow and get this situation resolved prior to any surgical interventions for his shoulder.  Assessment & Plan: Visit Diagnoses:  1. Olecranon bursitis of left elbow   2. Pain in left elbow     Plan:After informed consent, the left elbow was sterilely prepped and anesthetized with 1 cc of 1 % lidocaine and then aspirated with an 18 gauze needle for 10 cc of clear serous appearing slightly blood tinged fluid without difficulty and then injected with a combination of lidocaine and Depo medrol . The fluid was sent for culture, although it appears sterile. Will treat empirically with Doxycycline 100 mg BID and await culture results.  Ace wrap applied for compression.  Follow up later this week for recheck.   Follow-Up Instructions: Return in about 3 days (around 02/27/2019).   Ortho Exam  Patient is alert, oriented, no adenopathy, well-dressed, normal affect, normal respiratory effort. Left  elbow - edematous bursa with mild pink discoloration, but no erythema or increased warmth. Fluid present, but no open wounds or drainage. Tender to palpation and with range of motion.  The left olecranon bursa was aspirated under sterile techniques for ~ 10 cc of clear serous appearing fluid, does not appear infected, but will send for culture and cover with antibiotics.   Imaging: Xr Elbow 2 Views Left  Result Date: 02/24/2019 Radiographs of left elbow show mild osteoarthritis with joint space narrowing and subchondral sclerosis, no fractures or other osseous abnormalities.   No images are attached to the encounter.  Labs: Lab Results  Component Value Date   HGBA1C 5.7 (H) 09/16/2018   HGBA1C 7.7 (H) 05/15/2018   HGBA1C 7.1 (H) 01/07/2018   ESRSEDRATE 35 (H) 09/03/2016   ESRSEDRATE 111 (H) 05/11/2016   ESRSEDRATE 98 (H) 05/04/2016   CRP 1.2 (H) 09/03/2016   CRP 4.7 (H) 05/11/2016   CRP 8.0 (H) 05/04/2016   REPTSTATUS 11/15/2017 FINAL 11/10/2017   GRAMSTAIN  11/04/2017    ABUNDANT WBC PRESENT, PREDOMINANTLY PMN MODERATE GRAM NEGATIVE RODS MODERATE GRAM POSITIVE COCCI Performed at [facility], [place] [address], [place], [place] [address]    CULT  11/10/2017    NO GROWTH 5 DAYS Performed at [facility] [address]  [place], [place] [address]    [place] 09/26/2014     Lab Results  Component Value Date   ALBUMIN 3.5 05/15/2018   ALBUMIN 3.4 (L) 01/07/2018   ALBUMIN 3.4 (L) 01/03/2018   PREALBUMIN 19.0 05/04/2016   PREALBUMIN 25.0 12/16/2015    Body mass index is 33.04 kg/m.  Orders:  Orders Placed This Encounter  Procedures  . Medium Joint Inj  . Wound culture  . XR Elbow 2 Views Left   Meds ordered this encounter  Medications  . doxycycline (VIBRAMYCIN) 100 MG capsule    Sig: Take 1 capsule (100 mg total) by mouth 2 (two) times daily.    Dispense:  28 capsule    Refill:  1     Procedures: Medium Joint Inj: L olecranon  bursa on 02/24/2019 4:40 PM Indications: pain and diagnostic evaluation Details: 18 G 1.5 in needle, posterior approach Medications: 40 mg methylPREDNISolone acetate 40 MG/ML; 1 mL lidocaine 2 %; 5 mL lidocaine 1 % Aspirate: 10 mL serous, clear and blood-tinged Outcome: tolerated well, no immediate complications Procedure, treatment alternatives, risks and benefits explained, specific risks discussed. Consent was given by the patient. Immediately prior to procedure a time out was called to verify the correct patient, procedure, equipment, support staff and site/side marked as required. Patient was prepped and draped in the usual sterile fashion.      Clinical Data: No additional findings.  ROS:  All other systems negative, except as noted in the HPI. Review of Systems  Objective: Vital Signs: Ht 6\' 2"  (1.88 m)   Wt 257 lb 4.8 oz (116.7 kg)   BMI 33.04 kg/m   Specialty Comments:  No specialty comments available.  PMFS History: Patient Active Problem List   Diagnosis Date Noted  . 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 Pseudoclaudication 11/15/2018  . Essential hypertension 09/18/2018  . S/P BKA (below knee amputation), right ([place]) 09/18/2018  . Amputation of toe of left foot ([place]) 09/18/2018  . Peripheral artery disease ([place]) 09/18/2018  . S/P carotid endarterectomy 09/18/2018  . OSA on CPAP 09/18/2018  . Chronic back pain 09/18/2018  . Status post reversal of ileostomy 05/21/2018  . Normocytic anemia 02/14/2018  . Intra-abdominal abscess ([place]) 11/04/2017  . Chronic venous hypertension (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 ([place]) 12/11/2016  . Unilateral primary osteoarthritis, left knee 10/11/2016  . History of right below knee amputation ([place]) 07/12/2016  . Diabetic foot infection ([place]) 05/04/2016  . Type 2 diabetes mellitus with insulin therapy ([place]) 12/16/2015  . Amputated toe ([place]) 10/21/2015  . Leg edema, left 09/03/2015  . CAD (dz of distal,  mid and proximal RCA with implantation of 3 overlapping drug-eluting stent,) 09/03/2015  . Chronic diastolic heart failure, NYHA class 2 ([place]) 09/03/2015  . Angina pectoris associated with type 2 diabetes mellitus ([place]) 10/28/2014  . Hyperlipidemia 08/25/2014  . Chronic total occlusion of artery of the extremities ([place]) 04/08/2012  . Onychomycosis 02/01/2012  . Occlusion and stenosis of carotid artery without mention of cerebral infarction 06/12/2011  . GERD (gastroesophageal reflux disease) 05/08/2011  . Barrett's esophagus without dysplasia 05/08/2011  . Former tobacco use 02/01/2011   Past Medical History:  Diagnosis Date  . Carotid artery occlusion 11/10/10   LEFT CAROTID ENDARTERECTOMY  . Complication of anesthesia    BP WENT UP AT [facility] "  . COPD (chronic obstructive pulmonary disease) ([place])   . Diverticulitis   . Diverticulosis of colon (without mention of hemorrhage)   . DJD (  degenerative joint disease)   . Fatty liver   . Full dentures   . GERD (gastroesophageal reflux disease)   . H/O hiatal hernia   . Hyperlipidemia   . Hypertension   . Neuromuscular disorder ([place])    peripheral neuropathy  . Non-pressure chronic ulcer of other part of left foot limited to breakdown of skin ([place]) 11/12/2016  . 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 PAD (peripheral artery disease) ([place])    Distal aortogram June 2012. Atherectomy left popliteal artery July 2012.   . Pseudoclaudication 11/15/2018  . Slurred speech    AS PER WIFE IN D/C NOTE 11/10/10  . 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 Unstable angina ([place]) 09/16/2018  . Wears glasses     Family History  Problem Relation Age of Onset  . Heart disease Father        Before age 57-  CAD, BPG  . Diabetes Father        Amputation  . Cancer Father        PROSTATE  . Hyperlipidemia Father   . Hypertension Father   . Heart attack Father        Triple BPG  . Varicose Veins Father   . Colon cancer Brother   . Cancer Brother         Colon  . Diabetes Brother   . Heart disease Brother 66       A-Fib. Before age 40  . Hyperlipidemia Brother   . Hypertension Brother   . Cancer Sister        Breast  . Hyperlipidemia Sister   . Hypertension Sister   . Hypertension Son   . Arthritis Other        GRANDMOTHER  . Hypertension Other        OTHER FAMILY MEMBERS    Past Surgical History:  Procedure Laterality Date  . AMPUTATION  11/05/2011   Procedure: AMPUTATION RAY;  Surgeon: Wylene Simmer, MD;  Location: [facility];  Service: Orthopedics;  Laterality: Right;  Amputation of Right 4&5th Toes  . AMPUTATION Left 11/26/2012   Procedure: AMPUTATION RAY;  Surgeon: Wylene Simmer, MD;  Location: [facility];  Service: Orthopedics;  Laterality: Left;  fourth ray amputation  . AMPUTATION Right 08/27/2014   Procedure: Transmetatarsal Amputation;  Surgeon: Newt Minion, MD;  Location: [facility];  Service: Orthopedics;  Laterality: Right;  . AMPUTATION Right 01/14/2015   Procedure: AMPUTATION BELOW KNEE;  Surgeon: Newt Minion, MD;  Location: [facility];  Service: Orthopedics;  Laterality: Right;  . AMPUTATION Left 10/21/2015   Procedure: Left Foot 5th Ray Amputation;  Surgeon: Newt Minion, MD;  Location: [facility];  Service: Orthopedics;  Laterality: Left;  . ANTERIOR FUSION CERVICAL SPINE  02/06/06   C4-5, C5-6, C6-7; SURGEON DR. MAX COHEN  . BACK SURGERY    . BELOW KNEE LEG AMPUTATION Right   . CARDIAC CATHETERIZATION  10/31/04   2009  . CAROTID ENDARTERECTOMY  11/10/10  . CAROTID ENDARTERECTOMY Left 11/10/2010   Subtotal occlusion of left internal carotid artery with left hemispheric transient ischemic attacks.  . CAROTID STENT    . CARPAL TUNNEL RELEASE Right 10/21/2013   Procedure: RIGHT CARPAL TUNNEL RELEASE;  Surgeon: Wynonia Sours, MD;  Location: [facility];  Service: Orthopedics;  Laterality: Right;  . CHOLECYSTECTOMY    . COLON SURGERY    . COLONOSCOPY    . COLOSTOMY REVERSAL  05/21/2018   ileostomy reversal  . CYSTOSCOPY  WITH STENT  PLACEMENT Bilateral 01/13/2018   Procedure: CYSTOSCOPY WITH BILATERAL URETERAL CATHETER PLACEMENT;  Surgeon: Ardis Hughs, MD;  Location: [facility];  Service: Urology;  Laterality: Bilateral;  . ESOPHAGEAL MANOMETRY Bilateral 07/19/2014   Procedure: ESOPHAGEAL MANOMETRY (EM);  Surgeon: Jerene Bears, MD;  Location: [facility];  Service: Gastroenterology;  Laterality: Bilateral;  . FEMORAL ARTERY STENT     x6  . FINGER SURGERY    . FOOT SURGERY  04/25/2016    EXCISION BASE 5TH METATARSAL AND PARTIAL CUBOID LEFT FOOT  . HERNIA REPAIR     LEFT INGUINAL AND UMBILICAL REPAIRS  . HERNIA REPAIR    . I&D EXTREMITY Left 04/25/2016   Procedure: EXCISION BASE 5TH METATARSAL AND PARTIAL CUBOID LEFT FOOT;  Surgeon: Newt Minion, MD;  Location: [facility];  Service: Orthopedics;  Laterality: Left;  . ILEOSTOMY  01/13/2018   Procedure: ILEOSTOMY;  Surgeon: Clovis Riley, MD;  Location: [facility];  Service: General;;  . ILEOSTOMY CLOSURE N/A 05/21/2018   Procedure: ILEOSTOMY REVERSAL ERAS PATHWAY;  Surgeon: Clovis Riley, MD;  Location: [facility];  Service: General;  Laterality: N/A;  . IR RADIOLOGIST EVAL & MGMT  11/19/2017  . IR RADIOLOGIST EVAL & MGMT  12/03/2017  . IR RADIOLOGIST EVAL & MGMT  12/18/2017  . JOINT REPLACEMENT Right 2001   Total  . LAMINECTOMY     X 3 LUMBAR AND X 2 CERVICAL SPINE OPERATIONS  . LAPAROSCOPIC CHOLECYSTECTOMY W/ CHOLANGIOGRAPHY  11/09/04   SURGEON DR. Luella Cook  . LEFT HEART CATH AND CORONARY ANGIOGRAPHY N/A 09/16/2018   Procedure: LEFT HEART CATH AND CORONARY ANGIOGRAPHY;  Surgeon: Nigel Mormon, MD;  Location: [facility];  Service: Cardiovascular;  Laterality: N/A;  . LEFT HEART CATHETERIZATION WITH CORONARY ANGIOGRAM N/A 10/29/2014   Procedure: LEFT HEART CATHETERIZATION WITH CORONARY ANGIOGRAM;  Surgeon: Laverda Page, MD;  Location[facility];  Service: Cardiovascular;  Laterality: N/A;  . LOWER EXTREMITY ANGIOGRAM N/A 03/19/2012   Procedure: LOWER  EXTREMITY ANGIOGRAM;  Surgeon: Burnell Blanks, MD;  Location: [facility];  Service: Cardiovascular;  Laterality: N/A;  . NECK SURGERY    . PARTIAL COLECTOMY N/A 01/13/2018   Procedure: LAPAROSCOPIC ASSISTED   SIGMOID COLECTOMY ILEOSTOMY;  Surgeon: Clovis Riley, MD;  Location: [facility];  Service: General;  Laterality: N/A;  . PENILE PROSTHESIS IMPLANT  08/14/05   INFRAPUBIC INSERTION OF INFLATABLE PENILE PROSTHESIS; SURGEON DR. Amalia Hailey  . PENILE PROSTHESIS IMPLANT    . PERCUTANEOUS CORONARY STENT INTERVENTION (PCI-S) Right 10/29/2014   Procedure: PERCUTANEOUS CORONARY STENT INTERVENTION (PCI-S);  Surgeon: Laverda Page, MD;  Location: [facility];  Service: Cardiovascular;  Laterality: Right;  . SHOULDER ARTHROSCOPY    . SPINE SURGERY    . TOE AMPUTATION Left   . TONSILLECTOMY    . TOTAL KNEE ARTHROPLASTY  07/2002   RIGHT KNEE ; SURGEON  DR. [name] ALSO HAD ARTHROSCOPIC RIGHT KNEE IN  10/2001  . TOTAL KNEE ARTHROPLASTY    . ULNAR NERVE TRANSPOSITION Right 10/21/2013   Procedure: RIGHT ELBOW  ULNAR NERVE DECOMPRESSION;  Surgeon: [place] Sours, MD;  Location: [facility];  Service: Orthopedics;  Laterality: Right;   Social History   Occupational History  . Occupation: Magazine features editor: UNEMPLOYED  Tobacco Use  . Smoking status: Former Smoker    Packs/day: 2.00    Years: 35.00    Pack years: 70.00    Types:  Cigarettes    Quit date: 10/28/2011    Years since quitting: 7.3  . Smokeless tobacco: Never Used  Substance and Sexual Activity  . Alcohol use: Not Currently    Comment: "not in a long time"  . Drug use: Not Currently  . Sexual activity: Yes    Birth control/protection: Implant

## 2019-02-25 ENCOUNTER — Ambulatory Visit
Admission: RE | Admit: 2019-02-25 | Discharge: 2019-02-25 | Disposition: A | Discharge status: Home / Self Care | Payer: 59 | Source: Ambulatory Visit | Attending: Nephrology | Admitting: Nephrology

## 2019-02-25 DIAGNOSIS — N183 Chronic kidney disease, stage 3 unspecified: Secondary | ICD-10-CM

## 2019-02-25 MED ORDER — METHYLPREDNISOLONE ACETATE 40 MG/ML IJ SUSP
40.0000 mg | INTRAMUSCULAR | Status: AC | PRN
  Administered 2019-02-24: 40 mg via INTRA_ARTICULAR

## 2019-02-25 MED ORDER — LIDOCAINE HCL 2 % IJ SOLN
1.0000 mL | INTRAMUSCULAR | Status: AC | PRN
  Administered 2019-02-24: 1 mL

## 2019-02-25 MED ORDER — LIDOCAINE HCL 1 % IJ SOLN
5.0000 mL | INTRAMUSCULAR | Status: AC | PRN
  Administered 2019-02-24: 5 mL

## 2019-02-27 ENCOUNTER — Other Ambulatory Visit: Payer: Self-pay

## 2019-02-27 ENCOUNTER — Ambulatory Visit (INDEPENDENT_AMBULATORY_CARE_PROVIDER_SITE_OTHER): Payer: 59 | Admitting: Physician Assistant

## 2019-02-27 ENCOUNTER — Encounter: Payer: Self-pay | Admitting: Physician Assistant

## 2019-02-27 VITALS — Ht 74.0 in | Wt 257.0 lb

## 2019-02-27 DIAGNOSIS — Z89511 Acquired absence of right leg below knee: Secondary | ICD-10-CM | POA: Diagnosis not present

## 2019-02-27 DIAGNOSIS — M7022 Olecranon bursitis, left elbow: Secondary | ICD-10-CM

## 2019-02-27 DIAGNOSIS — M7542 Impingement syndrome of left shoulder: Secondary | ICD-10-CM

## 2019-02-27 LAB — WOUND CULTURE
GRAM STAIN:: NONE SEEN
MICRO NUMBER:: 574614
RESULT:: NO GROWTH
SPECIMEN QUALITY:: ADEQUATE

## 2019-02-27 NOTE — Progress Notes (Signed)
Office Visit Note   Patient: [name]           Date of Birth: 1956-02-08           MRN: [id number] Visit Date: 02/27/2019              Requested by: [name], PA-C [address] [place],  [place] [id number] PCP: [name], PA-C  Chief Complaint  Patient presents with  . Left Elbow - Follow-up    02/24/19 s/p aspiration left elbow olecranon bursitis.       HPI: Patient is a 63 year old gentleman who is seen for follow-up of his left elbow olecranon bursitis.  Cultures were obtained from the aspirate from the olecranon bursa and these showed no growth.  The patient had been empirically treated with doxycycline 100 mg twice daily for the past several days.  He reports that the elbow is still little pink but not nearly as tender and not bothering him as much.  He is continuing to have significant problems with his left shoulder and significant difficulty with movement of the shoulder.  He reports he is unable to sleep up on the left shoulder.  He reports problems donning and doffing his close.  He reports difficulty getting things out of high shelves above his head. He underwent MRI scanning of the left shoulder late last year and this showed moderate tendinosis of the supraspinatus tendon with a small partial-thickness articular surface tear, mild tendinosis of the infraspinatus tendon, moderate tendinosis of the subscapularis tendon, and severe tendinosis of the intra-articular portion of the long head of the biceps tendon.  The patient does wish to proceed with decompression and debridement of the left shoulder by arthroscopy at this time.  Assessment & Plan: Visit Diagnoses:  1. Olecranon bursitis of left elbow   2. Impingement syndrome of left shoulder   3. History of right below knee amputation ([place])     Plan: Counseled the patient that his left elbow olecranon bursitis was negative for infection.  He reports that it is improved following the aspiration and steroid  injection.  He would like to go ahead and proceed with setting up for his left shoulder debridement/decompression and will go ahead and submit for approval for the surgery.  Follow-Up Instructions: Return in about 4 weeks (around 03/27/2019).   Ortho Exam  Patient is alert, oriented, no adenopathy, well-dressed, normal affect, normal respiratory effort. The left elbow olecranon bursa is markedly improved with very little residual swelling and tenderness over the area.  There are no signs of cellulitis or infection.  He has improved elbow range of motion.  He has significant limitations with left shoulder range of motion.  He has less than 90 degrees of abduction and painful abduction internal and external rotation as well as forward flexion of the left shoulder.  He has tenderness over the biceps tendon insertion.  He does have some thenar wasting in the left hand noted.  Imaging: No results found. No images are attached to the encounter.  Labs: Lab Results  Component Value Date   HGBA1C 5.7 (H) 09/16/2018   HGBA1C 7.7 (H) 05/15/2018   HGBA1C 7.1 (H) 01/07/2018   ESRSEDRATE 35 (H) 09/03/2016   ESRSEDRATE 111 (H) 05/11/2016   ESRSEDRATE 98 (H) 05/04/2016   CRP 1.2 (H) 09/03/2016   CRP 4.7 (H) 05/11/2016   CRP 8.0 (H) 05/04/2016   REPTSTATUS 11/15/2017 FINAL 11/10/2017   GRAMSTAIN  11/04/2017    ABUNDANT WBC PRESENT,  PREDOMINANTLY PMN MODERATE GRAM NEGATIVE RODS MODERATE GRAM POSITIVE COCCI Performed at [facility], [place] [address] [place], [place] [address]    CULT  11/10/2017    NO GROWTH 5 DAYS Performed at [facility] [address], [place], [place] [address]    LABORGA STAPHYLOCOCCUS AUREUS 09/26/2014     Lab Results  Component Value Date   ALBUMIN 3.5 05/15/2018   ALBUMIN 3.4 (L) 01/07/2018   ALBUMIN 3.4 (L) 01/03/2018   PREALBUMIN 19.0 05/04/2016   PREALBUMIN 25.0 12/16/2015    Body mass index is 33 kg/m.  Orders:  No orders of the defined  types were placed in this encounter.  No orders of the defined types were placed in this encounter.    Procedures: No procedures performed  Clinical Data: No additional findings.  ROS:  All other systems negative, except as noted in the HPI. Review of Systems  Objective: Vital Signs: Ht 6\' 2"  (1.88 m)   Wt 257 lb (116.6 kg)   BMI 33.00 kg/m   Specialty Comments:  No specialty comments available.  PMFS History: Patient Active Problem List   Diagnosis Date Noted  . 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 Pseudoclaudication 11/15/2018  . Essential hypertension 09/18/2018  . S/P BKA (below knee amputation), right ([place]) 09/18/2018  . Amputation of toe of left foot ([place]) 09/18/2018  . Peripheral artery disease ([place]) 09/18/2018  . S/P carotid endarterectomy 09/18/2018  . OSA on CPAP 09/18/2018  . Chronic back pain 09/18/2018  . Status post reversal of ileostomy 05/21/2018  . Normocytic anemia 02/14/2018  . Intra-abdominal abscess ([place]) 11/04/2017  . Chronic venous hypertension (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 ([place]) 12/11/2016  . Unilateral primary osteoarthritis, left knee 10/11/2016  . History of right below knee amputation ([place]) 07/12/2016  . Diabetic foot infection ([place]) 05/04/2016  . Type 2 diabetes mellitus with insulin therapy ([place]) 12/16/2015  . Amputated toe ([place]) 10/21/2015  . Leg edema, left 09/03/2015  . CAD (dz of distal, mid and proximal RCA with implantation of 3 overlapping drug-eluting stent,) 09/03/2015  . Chronic diastolic heart failure, NYHA class 2 ([place]) 09/03/2015  . Angina pectoris associated with type 2 diabetes mellitus ([place]) 10/28/2014  . Hyperlipidemia 08/25/2014  . Chronic total occlusion of artery of the extremities ([place]) 04/08/2012  . Onychomycosis 02/01/2012  . Occlusion and stenosis of carotid artery without mention of cerebral infarction 06/12/2011  . GERD (gastroesophageal reflux disease) 05/08/2011  .  Barrett's esophagus without dysplasia 05/08/2011  . Former tobacco use 02/01/2011   Past Medical History:  Diagnosis Date  . Carotid artery occlusion 11/10/10   LEFT CAROTID ENDARTERECTOMY  . Complication of anesthesia    BP WENT UP AT [facility] "  . COPD (chronic obstructive pulmonary disease) ([place])   . Diverticulitis   . Diverticulosis of colon (without mention of hemorrhage)   . DJD (degenerative joint disease)   . Fatty liver   . Full dentures   . GERD (gastroesophageal reflux disease)   . H/O hiatal hernia   . Hyperlipidemia   . Hypertension   . Neuromuscular disorder ([place])    peripheral neuropathy  . Non-pressure chronic ulcer of other part of left foot limited to breakdown of skin ([place]) 11/12/2016  . Osteomyelitis ([place])    left 5th metatarsal  . PAD (peripheral artery disease) ([place])    Distal aortogram June 2012. Atherectomy left popliteal artery July 2012.   . Pseudoclaudication 11/15/2018  . Slurred speech  AS PER WIFE IN D/C NOTE 11/10/10  . 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 Unstable angina ([place]) 09/16/2018  . Wears glasses     Family History  Problem Relation Age of Onset  . Heart disease Father        Before age 61-  CAD, BPG  . Diabetes Father        Amputation  . Cancer Father        PROSTATE  . Hyperlipidemia Father   . Hypertension Father   . Heart attack Father        Triple BPG  . Varicose Veins Father   . Colon cancer Brother   . Cancer Brother        Colon  . Diabetes Brother   . Heart disease Brother 2       A-Fib. Before age 8  . Hyperlipidemia Brother   . Hypertension Brother   . Cancer Sister        Breast  . Hyperlipidemia Sister   . Hypertension Sister   . Hypertension Son   . Arthritis Other        GRANDMOTHER  . Hypertension Other        OTHER FAMILY MEMBERS    Past Surgical History:  Procedure Laterality Date  . AMPUTATION  11/05/2011   Procedure: AMPUTATION RAY;  Surgeon: Wylene Simmer, MD;  Location: [facility];  Service: Orthopedics;   Laterality: Right;  Amputation of Right 4&5th Toes  . AMPUTATION Left 11/26/2012   Procedure: AMPUTATION RAY;  Surgeon: Wylene Simmer, MD;  Location: [facility];  Service: Orthopedics;  Laterality: Left;  fourth ray amputation  . AMPUTATION Right 08/27/2014   Procedure: Transmetatarsal Amputation;  Surgeon: Newt Minion, MD;  Location: [facility];  Service: Orthopedics;  Laterality: Right;  . AMPUTATION Right 01/14/2015   Procedure: AMPUTATION BELOW KNEE;  Surgeon: Newt Minion, MD;  Location: [facility];  Service: Orthopedics;  Laterality: Right;  . AMPUTATION Left 10/21/2015   Procedure: Left Foot 5th Ray Amputation;  Surgeon: Newt Minion, MD;  Location: [facility];  Service: Orthopedics;  Laterality: Left;  . ANTERIOR FUSION CERVICAL SPINE  02/06/06   C4-5, C5-6, C6-7; SURGEON DR. MAX COHEN  . BACK SURGERY    . BELOW KNEE LEG AMPUTATION Right   . CARDIAC CATHETERIZATION  10/31/04   2009  . CAROTID ENDARTERECTOMY  11/10/10  . CAROTID ENDARTERECTOMY Left 11/10/2010   Subtotal occlusion of left internal carotid artery with left hemispheric transient ischemic attacks.  . CAROTID STENT    . CARPAL TUNNEL RELEASE Right 10/21/2013   Procedure: RIGHT CARPAL TUNNEL RELEASE;  Surgeon: Wynonia Sours, MD;  Location: [facility];  Service: Orthopedics;  Laterality: Right;  . CHOLECYSTECTOMY    . COLON SURGERY    . COLONOSCOPY    . COLOSTOMY REVERSAL  05/21/2018   ileostomy reversal  . CYSTOSCOPY WITH STENT PLACEMENT Bilateral 01/13/2018   Procedure: CYSTOSCOPY WITH BILATERAL URETERAL CATHETER PLACEMENT;  Surgeon: Ardis Hughs, MD;  Location: [facility];  Service: Urology;  Laterality: Bilateral;  . ESOPHAGEAL MANOMETRY Bilateral 07/19/2014   Procedure: ESOPHAGEAL MANOMETRY (EM);  Surgeon: Jerene Bears, MD;  [facility];  Service: Gastroenterology;  Laterality: Bilateral;  . FEMORAL ARTERY STENT     x6  . FINGER SURGERY    . FOOT SURGERY  04/25/2016    EXCISION BASE 5TH METATARSAL AND PARTIAL  CUBOID LEFT FOOT  . HERNIA REPAIR     LEFT INGUINAL AND UMBILICAL  REPAIRS  . HERNIA REPAIR    . I&D EXTREMITY Left 04/25/2016   Procedure: EXCISION BASE 5TH METATARSAL AND PARTIAL CUBOID LEFT FOOT;  Surgeon: Newt Minion, MD;  Location: [facility];  Service: Orthopedics;  Laterality: Left;  . ILEOSTOMY  01/13/2018   Procedure: ILEOSTOMY;  Surgeon: Clovis Riley, MD;  Location: [facility];  Service: General;;  . ILEOSTOMY CLOSURE N/A 05/21/2018   Procedure: ILEOSTOMY REVERSAL ERAS PATHWAY;  Surgeon: Clovis Riley, MD;  Location: [facility];  Service: General;  Laterality: N/A;  . IR RADIOLOGIST EVAL & MGMT  11/19/2017  . IR RADIOLOGIST EVAL & MGMT  12/03/2017  . IR RADIOLOGIST EVAL & MGMT  12/18/2017  . JOINT REPLACEMENT Right 2001   Total  . LAMINECTOMY     X 3 LUMBAR AND X 2 CERVICAL SPINE OPERATIONS  . LAPAROSCOPIC CHOLECYSTECTOMY W/ CHOLANGIOGRAPHY  11/09/04   SURGEON DR. Luella Cook  . LEFT HEART CATH AND CORONARY ANGIOGRAPHY N/A 09/16/2018   Procedure: LEFT HEART CATH AND CORONARY ANGIOGRAPHY;  Surgeon: Nigel Mormon, MD;  Location: [facility];  Service: Cardiovascular;  Laterality: N/A;  . LEFT HEART CATHETERIZATION WITH CORONARY ANGIOGRAM N/A 10/29/2014   Procedure: LEFT HEART CATHETERIZATION WITH CORONARY ANGIOGRAM;  Surgeon: Laverda Page, MD;  Location[facility];  Service: Cardiovascular;  Laterality: N/A;  . LOWER EXTREMITY ANGIOGRAM N/A 03/19/2012   Procedure: LOWER EXTREMITY ANGIOGRAM;  Surgeon: Burnell Blanks, MD;  Location: [facility] LAB;  Service: Cardiovascular;  Laterality: N/A;  . NECK SURGERY    . PARTIAL COLECTOMY N/A 01/13/2018   Procedure: LAPAROSCOPIC ASSISTED   SIGMOID COLECTOMY ILEOSTOMY;  Surgeon: Clovis Riley, MD;  Location[facility];  Service: General;  Laterality: N/A;  . PENILE PROSTHESIS IMPLANT  08/14/05   INFRAPUBIC INSERTION OF INFLATABLE PENILE PROSTHESIS; SURGEON DR. Amalia Hailey  . PENILE PROSTHESIS IMPLANT    . PERCUTANEOUS CORONARY STENT  INTERVENTION (PCI-S) Right 10/29/2014   Procedure: PERCUTANEOUS CORONARY STENT INTERVENTION (PCI-S);  Surgeon: Laverda Page, MD;  Location: [facility] LAB;  Service: Cardiovascular;  Laterality: Right;  . SHOULDER ARTHROSCOPY    . SPINE SURGERY    . TOE AMPUTATION Left   . TONSILLECTOMY    . TOTAL KNEE ARTHROPLASTY  07/2002   RIGHT KNEE ; SURGEON  DR. [name] ALSO HAD ARTHROSCOPIC RIGHT KNEE IN  10/2001  . TOTAL KNEE ARTHROPLASTY    . ULNAR NERVE TRANSPOSITION Right 10/21/2013   Procedure: RIGHT ELBOW  ULNAR NERVE DECOMPRESSION;  Surgeon: Wynonia Sours, MD;  Location: [facility];  Service: Orthopedics;  Laterality: Right;   Social History   Occupational History  . Occupation: Magazine features editor: UNEMPLOYED  Tobacco Use  . Smoking status: Former Smoker    Packs/day: 2.00    Years: 35.00    Pack years: 70.00    Types: Cigarettes    Quit date: 10/28/2011    Years since quitting: 7.3  . Smokeless tobacco: Never Used  Substance and Sexual Activity  . Alcohol use: Not Currently    Comment: "not in a long time"  . Drug use: Not Currently  . Sexual activity: Yes    Birth control/protection: Implant

## 2019-03-01 ENCOUNTER — Encounter: Payer: Self-pay | Admitting: Physician Assistant

## 2019-03-25 ENCOUNTER — Other Ambulatory Visit: Payer: Self-pay | Admitting: Family

## 2019-04-14 ENCOUNTER — Other Ambulatory Visit (HOSPITAL_COMMUNITY): Payer: Self-pay | Admitting: *Deleted

## 2019-04-14 NOTE — Discharge Instructions (Signed)
Epoetin Alfa injection What is this medicine? EPOETIN ALFA (e POE e tin AL fa) helps your body make more red blood cells. This medicine is used to treat anemia caused by chronic kidney disease, cancer chemotherapy, or HIV-therapy. It may also be used before surgery if you have anemia. This medicine may be used for other purposes; ask your health care provider or pharmacist if you have questions. COMMON BRAND NAME(S): Epogen, Procrit, Retacrit What should I tell my health care provider before I take this medicine? They need to know if you have any of these conditions:  cancer  heart disease  high blood pressure  history of blood clots  history of stroke  low levels of folate, iron, or vitamin B12 in the blood  seizures  an unusual or allergic reaction to erythropoietin, albumin, benzyl alcohol, hamster proteins, other medicines, foods, dyes, or preservatives  pregnant or trying to get pregnant  breast-feeding How should I use this medicine? This medicine is for injection into a vein or under the skin. It is usually given by a health care professional in a hospital or clinic setting. If you get this medicine at home, you will be taught how to prepare and give this medicine. Use exactly as directed. Take your medicine at regular intervals. Do not take your medicine more often than directed. It is important that you put your used needles and syringes in a special sharps container. Do not put them in a trash can. If you do not have a sharps container, call your pharmacist or healthcare provider to get one. A special MedGuide will be given to you by the pharmacist with each prescription and refill. Be sure to read this information carefully each time. Talk to your pediatrician regarding the use of this medicine in children. While this drug may be prescribed for selected conditions, precautions do apply. Overdosage: If you think you have taken too much of this medicine contact a poison  control center or emergency room at once. NOTE: This medicine is only for you. Do not share this medicine with others. What if I miss a dose? If you miss a dose, take it as soon as you can. If it is almost time for your next dose, take only that dose. Do not take double or extra doses. What may interact with this medicine? Interactions have not been studied. This list may not describe all possible interactions. Give your health care provider a list of all the medicines, herbs, non-prescription drugs, or dietary supplements you use. Also tell them if you smoke, drink alcohol, or use illegal drugs. Some items may interact with your medicine. What should I watch for while using this medicine? Your condition will be monitored carefully while you are receiving this medicine. You may need blood work done while you are taking this medicine. This medicine may cause a decrease in vitamin B6. You should make sure that you get enough vitamin B6 while you are taking this medicine. Discuss the foods you eat and the vitamins you take with your health care professional. What side effects may I notice from receiving this medicine? Side effects that you should report to your doctor or health care professional as soon as possible:  allergic reactions like skin rash, itching or hives, swelling of the face, lips, or tongue  seizures  signs and symptoms of a blood clot such as breathing problems; changes in vision; chest pain; severe, sudden headache; pain, swelling, warmth in the leg; trouble speaking; sudden numbness or   weakness of the face, arm or leg  signs and symptoms of a stroke like changes in vision; confusion; trouble speaking or understanding; severe headaches; sudden numbness or weakness of the face, arm or leg; trouble walking; dizziness; loss of balance or coordination Side effects that usually do not require medical attention (report to your doctor or health care professional if they continue or are  bothersome):  chills  cough  dizziness  fever  headaches  joint pain  muscle cramps  muscle pain  nausea, vomiting  pain, redness, or irritation at site where injected This list may not describe all possible side effects. Call your doctor for medical advice about side effects. You may report side effects to FDA at 1-800-FDA-1088. Where should I keep my medicine? Keep out of the reach of children. Store in a refrigerator between 2 and 8 degrees C (36 and 46 degrees F). Do not freeze or shake. Throw away any unused portion if using a single-dose vial. Multi-dose vials can be kept in the refrigerator for up to 21 days after the initial dose. Throw away unused medicine. NOTE: This sheet is a summary. It may not cover all possible information. If you have questions about this medicine, talk to your doctor, pharmacist, or health care provider.  2020 Elsevier/Gold Standard (2017-04-05 08:35:19)  

## 2019-04-15 ENCOUNTER — Ambulatory Visit (HOSPITAL_COMMUNITY): Admission: RE | Admit: 2019-04-15 | Payer: 59 | Source: Home / Self Care | Admitting: Orthopedic Surgery

## 2019-04-15 ENCOUNTER — Other Ambulatory Visit: Payer: Self-pay

## 2019-04-15 ENCOUNTER — Encounter (HOSPITAL_COMMUNITY): Admission: RE | Payer: Self-pay | Source: Home / Self Care

## 2019-04-15 ENCOUNTER — Encounter (HOSPITAL_COMMUNITY)
Admission: RE | Admit: 2019-04-15 | Discharge: 2019-04-15 | Disposition: A | Discharge status: Home / Self Care | Payer: 59 | Source: Ambulatory Visit | Attending: Nephrology | Admitting: Nephrology

## 2019-04-15 DIAGNOSIS — D631 Anemia in chronic kidney disease: Secondary | ICD-10-CM | POA: Diagnosis not present

## 2019-04-15 DIAGNOSIS — N183 Chronic kidney disease, stage 3 (moderate): Secondary | ICD-10-CM | POA: Diagnosis present

## 2019-04-15 LAB — POCT HEMOGLOBIN-HEMACUE: Hemoglobin: 10 g/dL — ABNORMAL LOW (ref 13.0–17.0)

## 2019-04-15 SURGERY — ARTHROSCOPY, SHOULDER
Anesthesia: General | Laterality: Left

## 2019-04-15 MED ORDER — EPOETIN ALFA-EPBX 10000 UNIT/ML IJ SOLN
20000.0000 [IU] | Freq: Once | INTRAMUSCULAR | Status: DC
  Administered 2019-04-15: 20000 [IU] via SUBCUTANEOUS
  Filled 2019-04-15: qty 2

## 2019-05-01 IMAGING — RF DG SINUS / FISTULA TRACT / ABSCESSOGRAM
3 series · 6 of 6 positions shown · non-contrast
Comparison: none

INDICATION: Status post percutaneous catheter drainage of sigmoid diverticular
abscess on 11/04/2017. Follow-up CT earlier today demonstrates
resolution of fluid component of abscess. Contrast injection under
fluoroscopy is performed to assess for fistula.

[Series 1: one shot · 1 of 1 slices shown (1 of 2)]
[im 1/1]
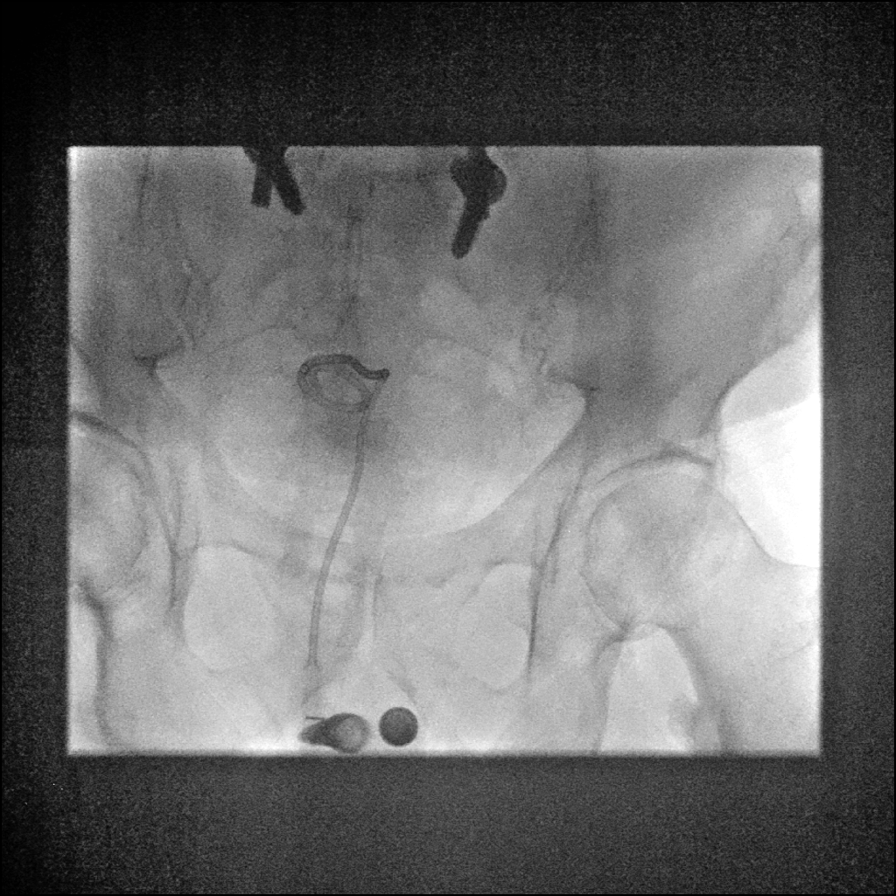

[Series 2: sequence · 4 of 54 frames shown]
[frame 4/54]
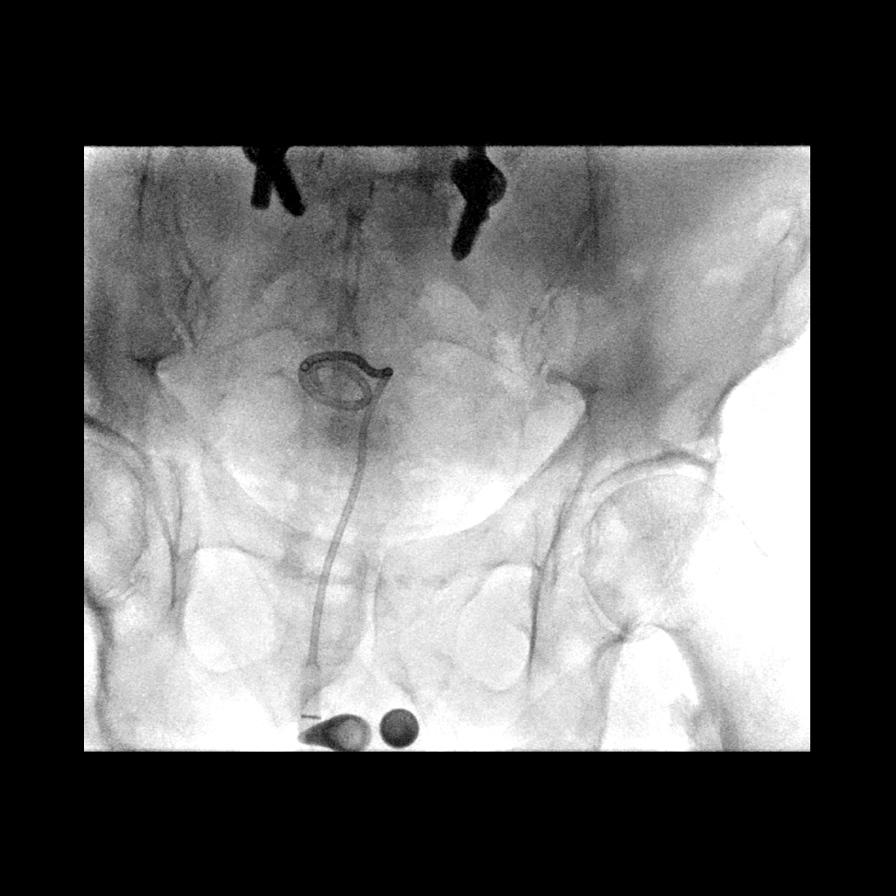
[frame 9/54]
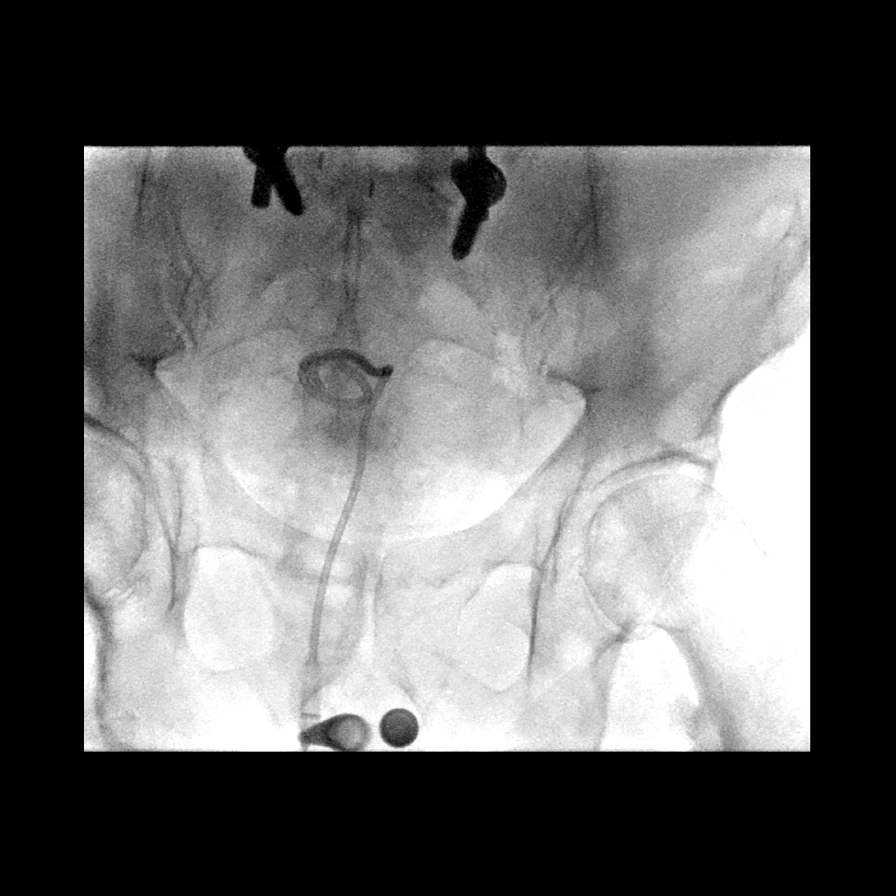
[frame 28/54]
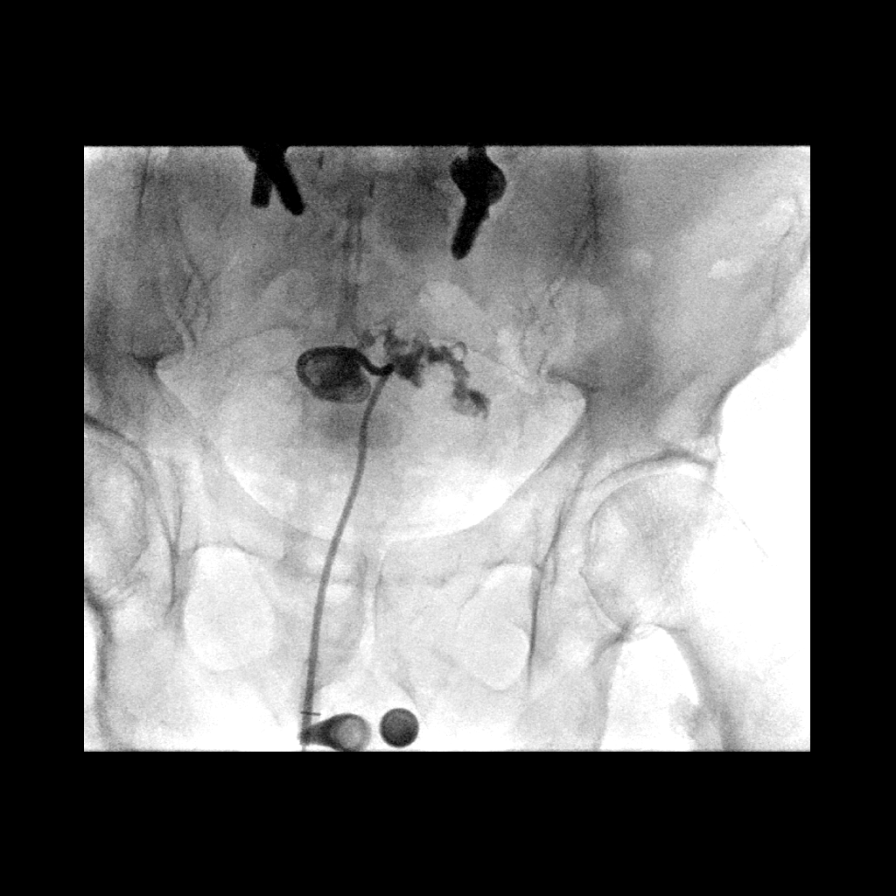
[frame 46/54]
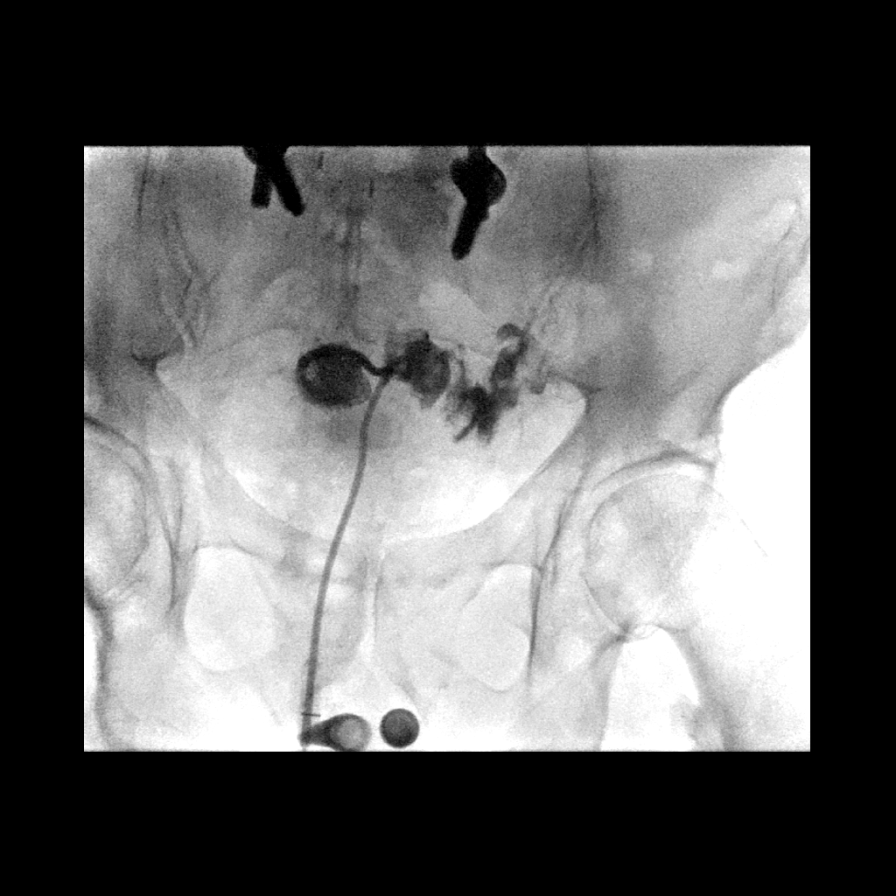

[Series 3: one shot · 1 of 1 slices shown (2 of 2)]
[im 1/1]
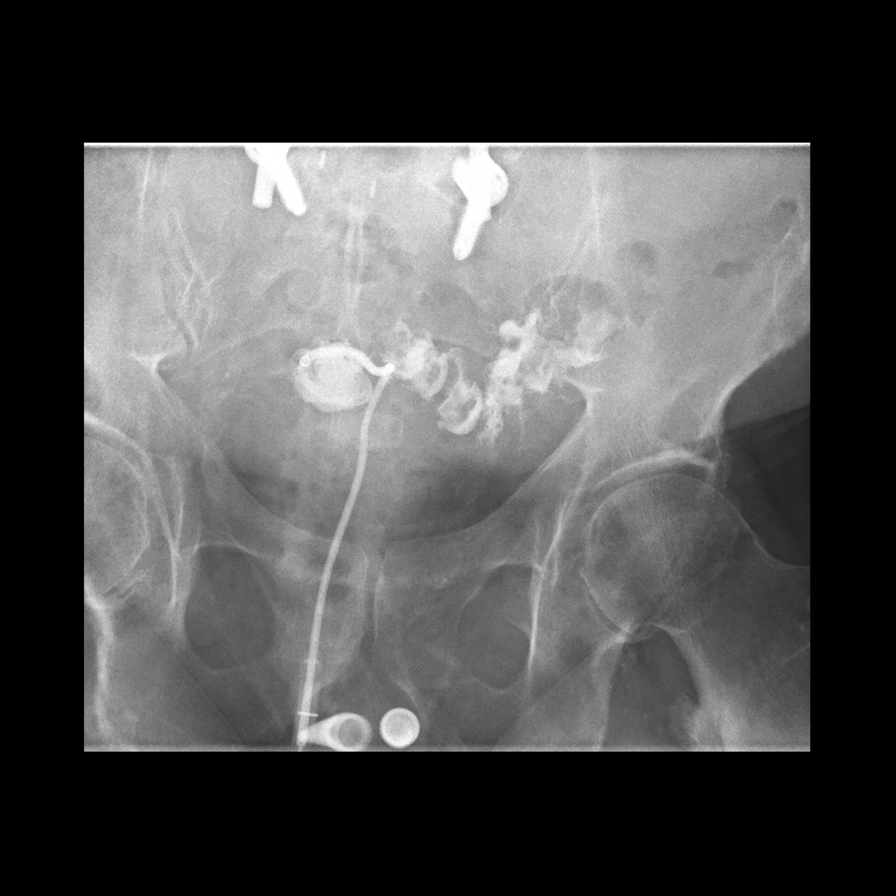

[6 of 6 positions shown; findings below may reference images not displayed]

EXAM:
INJECTION ABSCESS DRAINAGE CATHETER UNDER FLUOROSCOPY

CONTRAST:  20 mL Omnipaque 300

FLUOROSCOPY TIME:  18 seconds.  27.0 mGy.

MEDICATIONS:
None

ANESTHESIA/SEDATION:
None

COMPLICATIONS:
None immediate.

PROCEDURE:
The pre-existing percutaneous drainage catheter was injected with
contrast material under fluoroscopy. A cine loop and fluoroscopic
spot image were saved. The catheter was connected to a gravity
drainage bag.
FINDINGS: With injection of contrast, there is initial filling of a small
cavity conforming to the pigtail portion of the drainage catheter.
There then is immediate filling of a fistula to the adjacent sigmoid
colon with further opacification of the sigmoid colonic lumen.
IMPRESSION: The abscess drain injection demonstrates a patent communicating
fistula to the adjacent sigmoid colonic lumen. The drainage catheter
will be switched from suction bulb drainage to gravity bag drainage.

## 2019-05-01 IMAGING — CT CT ABD-PELV W/O CM
2 of 4 series · 12 of 46 positions shown, 14 images · non-contrast
Comparison: 10/31/2017

CLINICAL DATA: Status post percutaneous catheter drainage sigmoid
diverticular abscess 11/04/2017.

EXAM:
CT ABDOMEN AND PELVIS WITHOUT CONTRAST
TECHNIQUE: Multidetector CT imaging of the abdomen and pelvis was performed
following the standard protocol without IV contrast.

[Series 2: routine abdomen pelvis without 5.00 br40 s3 ax · axial · non-contrast · 0.61mm/px · z∈[+1325,+1775]mm · 9 of 109 slices shown, 11 images]
[im 10/109  soft-tissue]
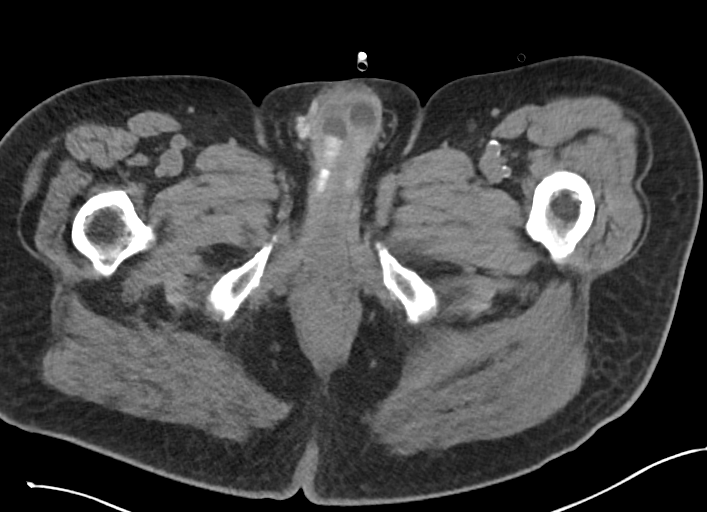
[im 10/109  bone]
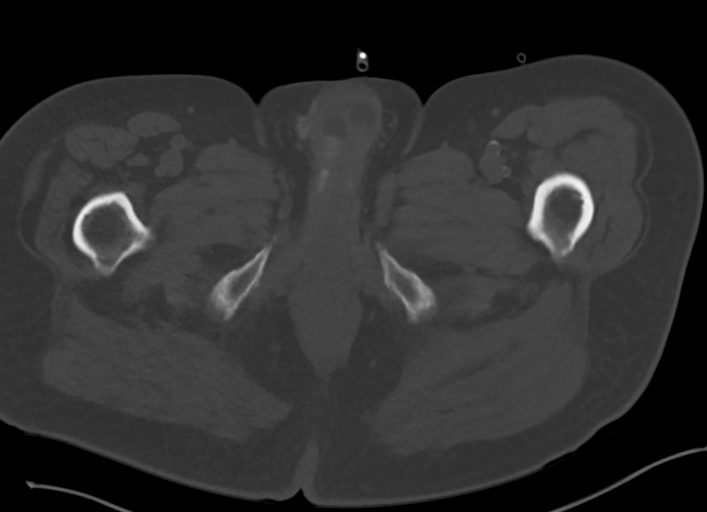
[im 19/109  soft-tissue]
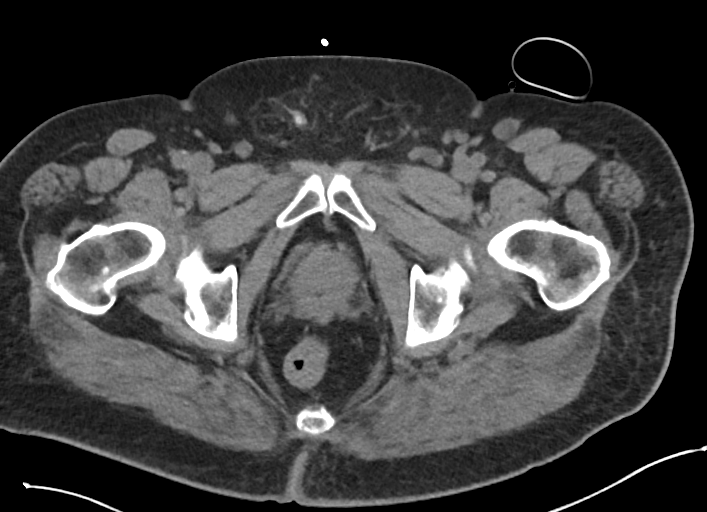
[im 32/109  soft-tissue]
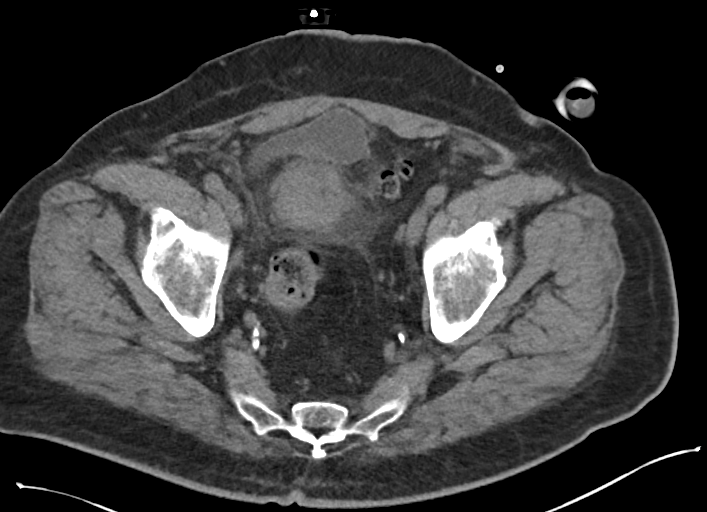
[im 41/109  soft-tissue]
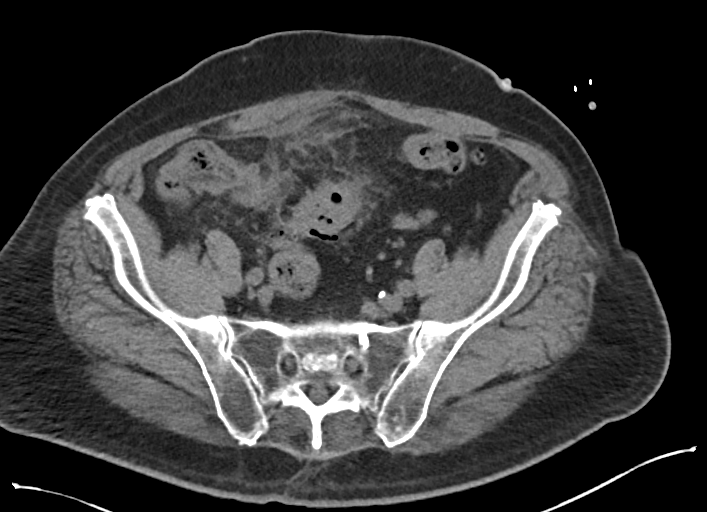
[im 55/109  soft-tissue]
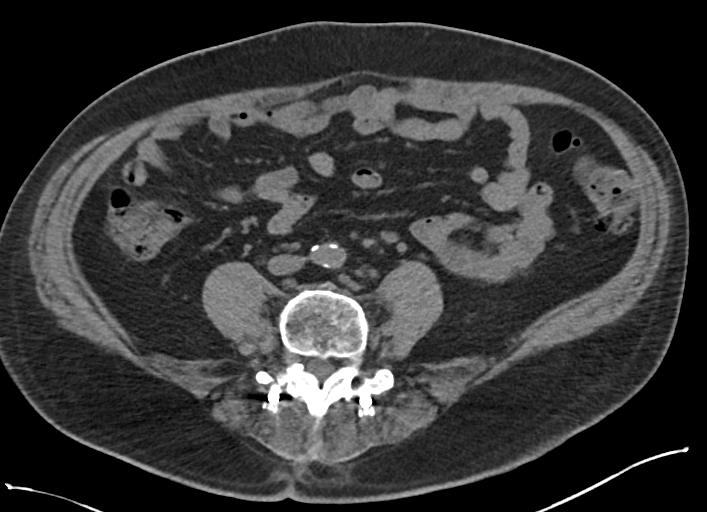
[im 68/109  soft-tissue]
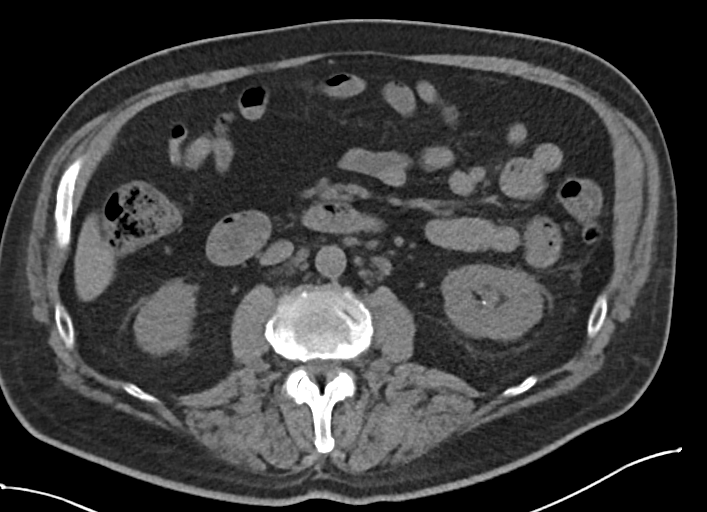
[im 77/109  soft-tissue]
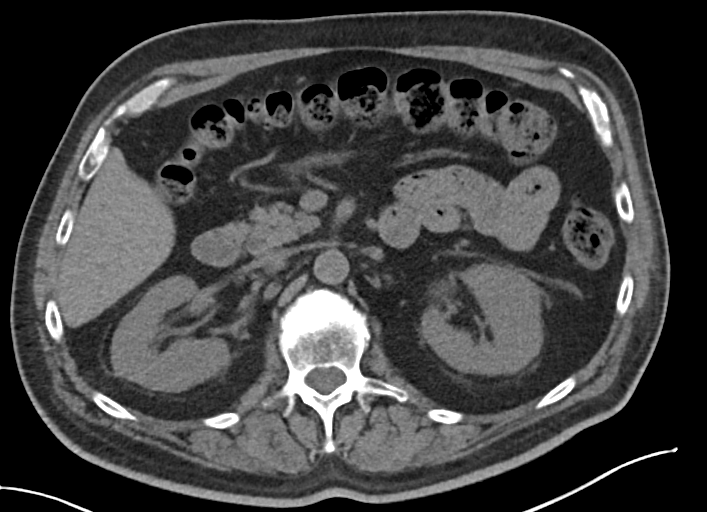
[im 91/109  soft-tissue]
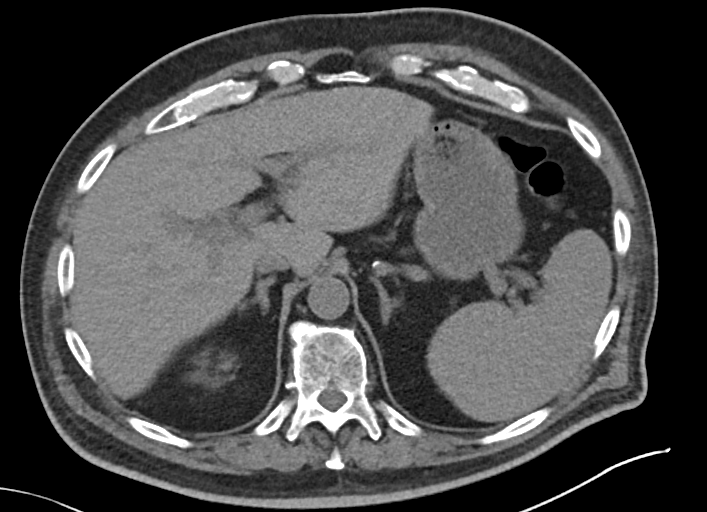
[im 100/109  soft-tissue]
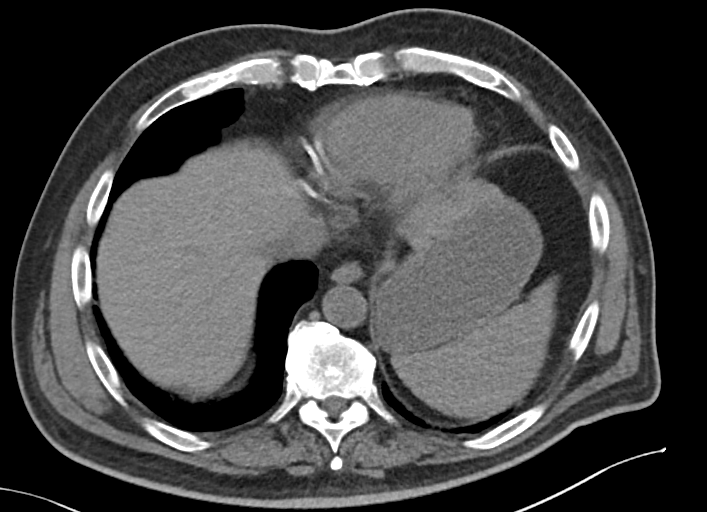
[im 100/109  bone]
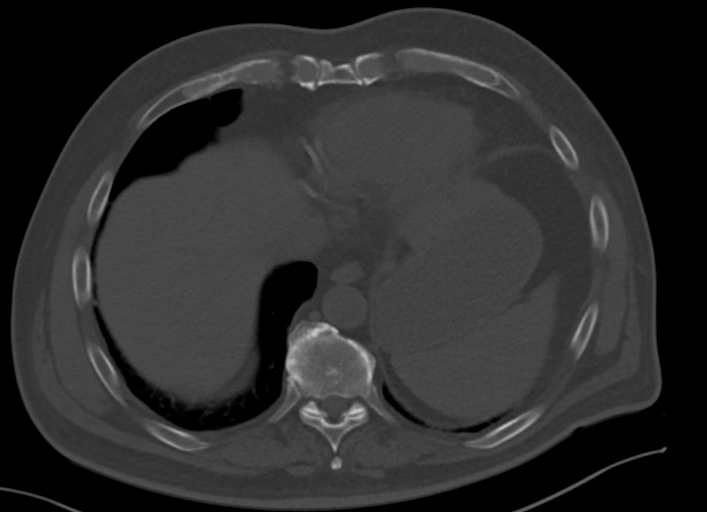

[Series 5: routine abdomen pelvis without 2.00 br40 s3 cor · coronal · non-contrast · 0.85mm/px · 3 of 157 slices shown]
[im 53/157  soft-tissue]
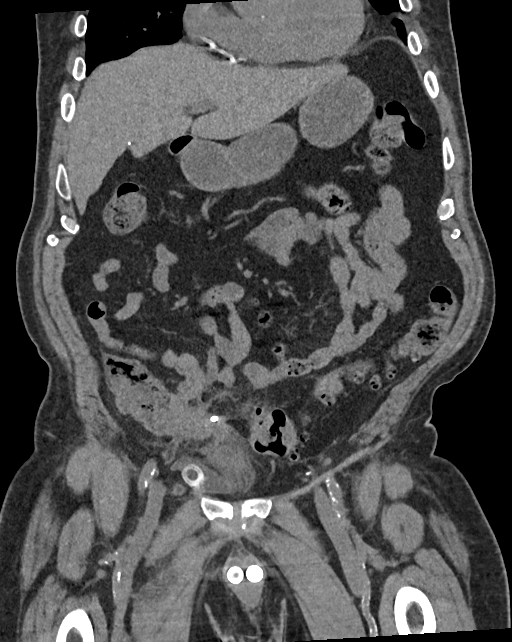
[im 70/157  soft-tissue]
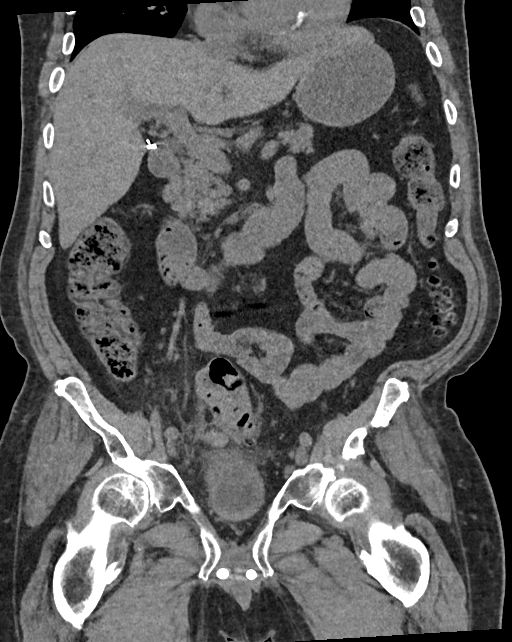
[im 87/157  soft-tissue]
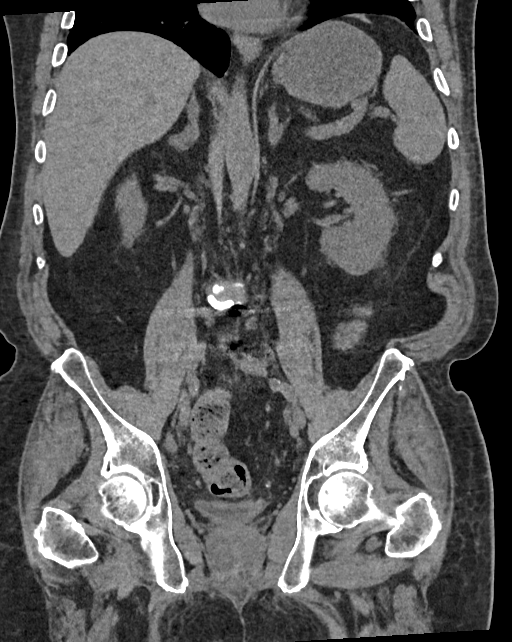

[12 of 46 positions shown; findings below may reference images not displayed]

FINDINGS: Lower chest: No acute abnormality.

Hepatobiliary: No focal liver abnormality is seen. Status post
cholecystectomy. No biliary dilatation.

Pancreas: Unremarkable. No pancreatic ductal dilatation or
surrounding inflammatory changes.

Spleen: Normal in size without focal abnormality.

Adrenals/Urinary Tract: No adrenal masses. Stable small
nonobstructing calculi in the lower pole collecting systems of both
kidneys. The bladder is decompressed.

Stomach/Bowel: No evidence of bowel obstruction or free air.
Inflammation anterior to the mid sigmoid colon has improved.
Percutaneous catheter present in the midline with collapse the
diverticular abscess. A small amount of air remains around the
distal portion of the catheter without significant residual fluid.

Vascular/Lymphatic: No significant vascular findings are present. No
enlarged abdominal or pelvic lymph nodes.

Reproductive: Prostate is unremarkable.  Penile prosthesis present.

Other: No abdominal wall hernia or abnormality. No abdominopelvic
ascites.

Musculoskeletal: Degenerative disc disease and status post posterior
lumbar fusion at L4-5 and L5-S1.
IMPRESSION: Resolution of fluid component of sigmoid diverticular abscess with
small amount of air present around the drainage catheter. This
catheter will be injected with contrast under fluoroscopy to
evaluate for fistula.

## 2019-05-12 ENCOUNTER — Other Ambulatory Visit (HOSPITAL_COMMUNITY): Payer: Self-pay

## 2019-05-13 ENCOUNTER — Other Ambulatory Visit: Payer: Self-pay

## 2019-05-13 ENCOUNTER — Ambulatory Visit (HOSPITAL_COMMUNITY)
Admission: RE | Admit: 2019-05-13 | Discharge: 2019-05-13 | Disposition: A | Discharge status: Home / Self Care | Payer: 59 | Source: Ambulatory Visit | Attending: Nephrology | Admitting: Nephrology

## 2019-05-13 DIAGNOSIS — N189 Chronic kidney disease, unspecified: Secondary | ICD-10-CM | POA: Insufficient documentation

## 2019-05-13 DIAGNOSIS — D631 Anemia in chronic kidney disease: Secondary | ICD-10-CM | POA: Insufficient documentation

## 2019-05-13 LAB — FERRITIN: Ferritin: 97 ng/mL (ref 24–336)

## 2019-05-13 LAB — IRON AND TIBC
Iron: 35 ug/dL — ABNORMAL LOW (ref 45–182)
Saturation Ratios: 15 % — ABNORMAL LOW (ref 17.9–39.5)
TIBC: 237 ug/dL — ABNORMAL LOW (ref 250–450)
UIBC: 202 ug/dL

## 2019-05-13 MED ORDER — EPOETIN ALFA-EPBX 10000 UNIT/ML IJ SOLN
20000.0000 [IU] | Freq: Once | INTRAMUSCULAR | Status: AC
  Administered 2019-05-13: 20000 [IU] via SUBCUTANEOUS
  Filled 2019-05-13: qty 2

## 2019-05-14 LAB — POCT HEMOGLOBIN-HEMACUE: Hemoglobin: 9.3 g/dL — ABNORMAL LOW (ref 13.0–17.0)

## 2019-05-25 ENCOUNTER — Ambulatory Visit (INDEPENDENT_AMBULATORY_CARE_PROVIDER_SITE_OTHER): Payer: 59 | Admitting: Cardiology

## 2019-05-25 ENCOUNTER — Other Ambulatory Visit: Payer: Self-pay

## 2019-05-25 ENCOUNTER — Encounter: Payer: Self-pay | Admitting: Cardiology

## 2019-05-25 VITALS — BP 140/69 | HR 57 | Temp 97.8°F | Ht 74.0 in | Wt 280.0 lb

## 2019-05-25 DIAGNOSIS — I453 Trifascicular block: Secondary | ICD-10-CM | POA: Diagnosis not present

## 2019-05-25 DIAGNOSIS — I739 Peripheral vascular disease, unspecified: Secondary | ICD-10-CM | POA: Diagnosis not present

## 2019-05-25 DIAGNOSIS — I25118 Atherosclerotic heart disease of native coronary artery with other forms of angina pectoris: Secondary | ICD-10-CM

## 2019-05-25 DIAGNOSIS — I10 Essential (primary) hypertension: Secondary | ICD-10-CM

## 2019-05-25 DIAGNOSIS — I6523 Occlusion and stenosis of bilateral carotid arteries: Secondary | ICD-10-CM

## 2019-05-25 NOTE — Progress Notes (Signed)
Primary Physician/Referring:  [name], PA-C  Patient ID: [name], male    DOB: Dec 16, 1955, 62 y.o.   MRN: [id number]  Chief Complaint  Patient presents with   Coronary Artery Disease   Follow-up    6 month   HPI:    [name]  is a 63 y.o. male  with severe peripheral arterial disease and has history of right below-knee amputation and toe lamputations on the left, multiple peripheral interventions in the past, uncontrolled diabetes mellitus, stage III CKD, coronary artery disease angioplasty to the right coronary artery on 10/29/2014, right carotid endarterectomy in 2012 , OSA Unable to tolerate CPAP, chronic back pain and neck and back surgery in past and on chronic pain medications.   Coronary Angiography 09/17/2018 revealed widely patent stent and no significant new disease. He now has stage 4 CKD. Denies claudication and left leg foot ulcer has healed after toe amputation.  Continues to use e-cigarettes but has reduced significantly.  Has chronic back pain and is on chronic fentanyl patch.  Past Medical History:  Diagnosis Date   Carotid artery occlusion 11/10/10   LEFT CAROTID ENDARTERECTOMY   Complication of anesthesia    BP WENT UP AT [facility] "   COPD (chronic obstructive pulmonary disease) (HCC)    Diverticulitis    Diverticulosis of colon (without mention of hemorrhage)    DJD (degenerative joint disease)    Fatty liver    Full dentures    GERD (gastroesophageal reflux disease)    H/O hiatal hernia    Hyperlipidemia    Hypertension    Neuromuscular disorder ([place])    peripheral neuropathy   Non-pressure chronic ulcer of other part of left foot limited to breakdown of skin ([place]) 11/12/2016   Osteomyelitis (HCC)    left 5th metatarsal   PAD (peripheral artery disease) ([place])    Distal aortogram June 2012. Atherectomy left popliteal artery July 2012.    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   Unstable angina ([place]) 09/16/2018   Wears glasses    Past Surgical History:  Procedure Laterality Date   AMPUTATION  11/05/2011   Procedure: AMPUTATION RAY;  Surgeon: Wylene Simmer, MD;  Location: [place];  Service: Orthopedics;  Laterality: Right;  Amputation of Right 4&5th Toes   AMPUTATION Left 11/26/2012   Procedure: AMPUTATION RAY;  Surgeon: Wylene Simmer, MD;  Location: [place];  Service: Orthopedics;  Laterality: Left;  fourth ray amputation   AMPUTATION Right 08/27/2014   Procedure: Transmetatarsal Amputation;  Surgeon: Newt Minion, MD;  Location: [place];  Service: Orthopedics;  Laterality: Right;   AMPUTATION Right 01/14/2015   Procedure: AMPUTATION BELOW KNEE;  Surgeon: Newt Minion, MD;  Location: [place];  Service: Orthopedics;  Laterality: Right;   AMPUTATION Left 10/21/2015   Procedure: Left Foot 5th Ray Amputation;  Surgeon: Newt Minion, MD;  Location: [place] [facility];  Service: Orthopedics;  Laterality: Left;   ANTERIOR FUSION CERVICAL SPINE  02/06/06   C4-5, C5-6, C6-7; SURGEON DR. MAX COHEN   BACK SURGERY     BELOW KNEE LEG AMPUTATION Right    CARDIAC CATHETERIZATION  10/31/04   2009   CAROTID ENDARTERECTOMY  11/10/10   CAROTID ENDARTERECTOMY Left 11/10/2010   Subtotal occlusion of left internal carotid artery with left hemispheric transient ischemic attacks.   CAROTID STENT     CARPAL TUNNEL RELEASE Right 10/21/2013  Procedure: RIGHT CARPAL TUNNEL RELEASE;  Surgeon: Wynonia Sours, MD;  Location: [place];  Service: Orthopedics;  Laterality: Right;   CHOLECYSTECTOMY     COLON SURGERY     COLONOSCOPY     COLOSTOMY REVERSAL  05/21/2018   ileostomy reversal   CYSTOSCOPY WITH STENT PLACEMENT Bilateral 01/13/2018   Procedure: CYSTOSCOPY WITH BILATERAL URETERAL CATHETER PLACEMENT;  Surgeon: Ardis Hughs, MD;  Location: [facility];  Service: Urology;  Laterality: Bilateral;   ESOPHAGEAL MANOMETRY Bilateral 07/19/2014   Procedure:  ESOPHAGEAL MANOMETRY (EM);  Surgeon: Jerene Bears, MD;  [facility];  Service: Gastroenterology;  Laterality: Bilateral;   FEMORAL ARTERY STENT     x6   FINGER SURGERY     FOOT SURGERY  04/25/2016    EXCISION BASE 5TH METATARSAL AND PARTIAL CUBOID LEFT FOOT   HERNIA REPAIR     LEFT INGUINAL AND UMBILICAL REPAIRS   HERNIA REPAIR     I&D EXTREMITY Left 04/25/2016   Procedure: EXCISION BASE 5TH METATARSAL AND PARTIAL CUBOID LEFT FOOT;  Surgeon: Newt Minion, MD;  Location: [facility];  Service: Orthopedics;  Laterality: Left;   ILEOSTOMY  01/13/2018   Procedure: ILEOSTOMY;  Surgeon: Clovis Riley, MD;  [facility];  Service: General;;   ILEOSTOMY CLOSURE N/A 05/21/2018   Procedure: ILEOSTOMY REVERSAL ERAS PATHWAY;  Surgeon: Clovis Riley, MD;  Location: [place];  Service: General;  Laterality: N/A;   IR RADIOLOGIST EVAL & MGMT  11/19/2017   IR RADIOLOGIST EVAL & MGMT  12/03/2017   IR RADIOLOGIST EVAL & MGMT  12/18/2017   JOINT REPLACEMENT Right 2001   Total   LAMINECTOMY     X 3 LUMBAR AND X 2 CERVICAL SPINE OPERATIONS   LAPAROSCOPIC CHOLECYSTECTOMY W/ CHOLANGIOGRAPHY  11/09/04   SURGEON DR. Kline CATH AND CORONARY ANGIOGRAPHY N/A 09/16/2018   Procedure: LEFT HEART CATH AND CORONARY ANGIOGRAPHY;  Surgeon: Nigel Mormon, MD;  Location: [facility];  Service: Cardiovascular;  Laterality: N/A;   LEFT HEART CATHETERIZATION WITH CORONARY ANGIOGRAM N/A 10/29/2014   Procedure: LEFT HEART CATHETERIZATION WITH CORONARY ANGIOGRAM;  Surgeon: Laverda Page, MD;  Location: [facility] C[facility];  Service: Cardiovascular;  Laterality: N/A;   LOWER EXTREMITY ANGIOGRAM N/A 03/19/2012   Procedure: LOWER EXTREMITY ANGIOGRAM;  Surgeon: Burnell Blanks, MD;  Location: [facility] CATH LAB;  Service: Cardiovascular;  Laterality: N/A;   NECK SURGERY     PARTIAL COLECTOMY N/A 01/13/2018   Procedure: LAPAROSCOPIC ASSISTED   SIGMOID COLECTOMY ILEOSTOMY;  Surgeon:  Clovis Riley, MD;  [facility];  Service: General;  Laterality: N/A;   PENILE PROSTHESIS IMPLANT  08/14/05   INFRAPUBIC INSERTION OF INFLATABLE PENILE PROSTHESIS; SURGEON DR. Amalia Hailey   PENILE PROSTHESIS IMPLANT     PERCUTANEOUS CORONARY STENT INTERVENTION (PCI-S) Right 10/29/2014   Procedure: PERCUTANEOUS CORONARY STENT INTERVENTION (PCI-S);  Surgeon: Laverda Page, MD;  Location: [facility] CATH LAB;  Service: Cardiovascular;  Laterality: Right;   SHOULDER ARTHROSCOPY     SPINE SURGERY     TOE AMPUTATION Left    TONSILLECTOMY     TOTAL KNEE ARTHROPLASTY  07/2002   RIGHT KNEE ; SURGEON  DR. [name] ALSO HAD ARTHROSCOPIC RIGHT KNEE IN  10/2001   TOTAL KNEE ARTHROPLASTY     ULNAR NERVE TRANSPOSITION Right 10/21/2013   Procedure: RIGHT ELBOW  ULNAR NERVE DECOMPRESSION;  Surgeon: Wynonia Sours, MD;  Location: [place];  Service: Orthopedics;  Laterality: Right;   Social History   Socioeconomic History   Marital status: Married    Spouse name: Not on file   Number of children: 3   Years of education: Not on file   Highest education level: Not on file  Occupational History   Occupation: TRUCK Education administrator: UNEMPLOYED  Social Designer, fashion/clothing strain: Not on file   Food insecurity    Worry: Not on file    Inability: Not on file   Transportation needs    Medical: Not on file    Non-medical: Not on file  Tobacco Use   Smoking status: Former Smoker    Packs/day: 2.00    Years: 35.00    Pack years: 70.00    Types: Cigarettes    Quit date: 10/28/2011    Years since quitting: 7.5   Smokeless tobacco: Never Used  Substance and Sexual Activity   Alcohol use: Not Currently    Comment: "not in a long time"   Drug use: Not Currently   Sexual activity: Yes    Birth control/protection: Implant  Lifestyle   Physical activity    Days per week: Not on file    Minutes per session: Not on file   Stress: Not on file  Relationships     Social connections    Talks on phone: Not on file    Gets together: Not on file    Attends religious service: Not on file    Active member of club or organization: Not on file    Attends meetings of clubs or organizations: Not on file    Relationship status: Not on file   Intimate partner violence    Fear of current or ex partner: Not on file    Emotionally abused: Not on file    Physically abused: Not on file    Forced sexual activity: Not on file  Other Topics Concern   Not on file  Social History Narrative   ** Merged History Encounter **       HEAVY SMOKER AND CONTINUES TO SMOKE 1 PPD. DOES NOT EXERCISE REGULARLY.  Wife [phone] Lebron Quam). Has 2 sons and daughter. Still active       ROS  Review of Systems  Constitution: Positive for malaise/fatigue. Negative for chills, decreased appetite and weight gain.  Cardiovascular: Positive for leg swelling. Negative for dyspnea on exertion and syncope.  Endocrine: Negative for cold intolerance.  Hematologic/Lymphatic: Does not bruise/bleed easily.  Musculoskeletal: Positive for arthritis and back pain. Negative for joint swelling.  Gastrointestinal: Negative for abdominal pain, anorexia, change in bowel habit, hematochezia and melena.  Neurological: Negative for headaches and light-headedness.  Psychiatric/Behavioral: Negative for depression and substance abuse.  All other systems reviewed and are negative.  Objective   Vitals with BMI 05/25/2019 05/13/2019 04/15/2019  Height 6\' 2"  - -  Weight 280 lbs - -  BMI 34.74 - -  Systolic 259 563 875  Diastolic 69 54 68  Pulse 57 - 71    Blood pressure 140/69, pulse (!) 57, temperature 97.8 F (36.6 C), height 6\' 2"  (1.88 m), weight 280 lb (127 kg), SpO2 97 %. Body mass index is 35.95 kg/m.   Physical Exam  Constitutional: He appears well-developed and well-nourished. No distress.  HENT:  Head: Atraumatic.  Eyes: Conjunctivae are normal.  Neck: Neck supple. No JVD  present. No thyromegaly present.  Cardiovascular: Normal rate, regular rhythm, normal heart sounds and intact  distal pulses. Exam reveals no gallop.  No murmur heard. Pulses:      Carotid pulses are on the right side with bruit and on the left side with bruit.      Femoral pulses are 1+ on the right side with bruit and 1+ on the left side with bruit.      Popliteal pulses are 2+ on the right side and 2+ on the left side.       Dorsalis pedis pulses are 1+ on the left side. Right dorsalis pedis pulse not accessible.       Posterior tibial pulses are 2+ on the left side. Right posterior tibial pulse not accessible.  Right BKA. There is 2+ left leg pitting edema below knee. No JVD  Pulmonary/Chest: Effort normal and breath sounds normal.  Abdominal: Soft. Bowel sounds are normal.  Neurological: He is alert.  Skin: Skin is warm and dry.  Psychiatric: He has a normal mood and affect.   Radiology: No results found.  Laboratory examination:   Recent Labs    09/16/18 0129 09/16/18 1806 09/18/18 1111  NA 138  --  132*  K 3.4*  --  3.4*  CL 105  --  99  CO2 26  --  24  GLUCOSE 203*  --  155*  BUN 13  --  35*  CREATININE 1.96* 2.11* 3.34*  CALCIUM 8.3*  --  8.0*  GFRNONAA 35* 32* 18*  GFRAA 41* 37* 21*   CMP Latest Ref Rng & Units 09/18/2018 09/16/2018 09/16/2018  Glucose 70 - 99 mg/dL 155(H) - 203(H)  BUN 8 - 23 mg/dL 35(H) - 13  Creatinine 0.61 - 1.24 mg/dL 3.34(H) 2.11(H) 1.96(H)  Sodium 135 - 145 mmol/L 132(L) - 138  Potassium 3.5 - 5.1 mmol/L 3.4(L) - 3.4(L)  Chloride 98 - 111 mmol/L 99 - 105  CO2 22 - 32 mmol/L 24 - 26  Calcium 8.9 - 10.3 mg/dL 8.0(L) - 8.3(L)  Total Protein 6.5 - 8.1 g/dL - - -  Total Bilirubin 0.3 - 1.2 mg/dL - - -  Alkaline Phos 38 - 126 U/L - - -  AST 15 - 41 U/L - - -  ALT 0 - 44 U/L - - -   CBC Latest Ref Rng & Units 05/13/2019 04/15/2019 09/16/2018  WBC 4.0 - 10.5 K/uL - - 9.6  Hemoglobin 13.0 - 17.0 g/dL 9.3(L) 10.0(L) 11.2(L)  Hematocrit 39.0 - 52.0 % -  - 32.7(L)  Platelets 150 - 400 K/uL - - 185   Lipid Panel  No results found for: CHOL, TRIG, HDL, CHOLHDL, VLDL, LDLCALC, LDLDIRECT HEMOGLOBIN A1C Lab Results  Component Value Date   HGBA1C 5.7 (H) 09/16/2018   MPG 116.89 09/16/2018   TSH No results for input(s): TSH in the last 8760 hours. Medications   Prior to Admission medications   Medication Sig Start Date End Date Taking? Authorizing Provider  amLODipine (NORVASC) 10 MG tablet Take 1 tablet (10 mg total) by mouth daily. 09/18/18   Patwardhan, Reynold Bowen, MD  aspirin EC 81 MG tablet Take 81 mg by mouth daily.    [provider]  atorvastatin (LIPITOR) 40 MG tablet Take 40 mg by mouth daily.    [provider]  carvedilol (COREG) 25 MG tablet Take 1.5 tablets (37.5 mg total) by mouth 2 (two) times daily. 08/01/16   Adrian Prows, MD  clobetasol (TEMOVATE) 0.05 % external solution Apply 1 application topically daily as needed (dry scalp).    [provider]  clopidogrel (PLAVIX) 75 MG tablet Take 75 mg by mouth daily.  03/28/12   [provider]  clotrimazole-betamethasone (LOTRISONE) cream Apply 1 application topically as needed (on affected areas on skin).    [provider]  collagenase (SANTYL) ointment Apply topically daily. Patient taking differently: Apply 1 application topically daily as needed (wound care). TO AFFECTED SITE(S) 09/06/15   Lavina Hamman, MD  doxycycline (VIBRAMYCIN) 100 MG capsule Take 1 capsule (100 mg total) by mouth 2 (two) times daily. 02/24/19   Rayburn, Neta Mends, PA-C  fentaNYL (DURAGESIC - DOSED MCG/HR) 50 MCG/HR Place 50 mcg onto the skin every 3 (three) days.    [provider]  ferrous sulfate 325 (65 FE) MG tablet Take 325 mg by mouth daily with breakfast.    [provider]  fluticasone (CUTIVATE) 0.05 % cream Apply 1 application topically 2 (two) times daily as needed (dry skin on face).     [provider]  folic acid  (FOLVITE) 517 MCG tablet Take 800 mcg by mouth daily.     [provider]  Gauze Pads & Dressings (GAUZE DRESSING) 4"X4" PADS 1 each by Does not apply route daily. 05/23/18   Clovis Riley, MD  Glucosamine HCl (GLUCOSAMINE PO) Take 2,000 mg by mouth daily.     [provider]  HUMALOG KWIKPEN 100 UNIT/ML KiwkPen Inject 15 Units into the skin 3 (three) times daily with meals. 15 units with breakfast, lunch, & dinner-- pt may increase dose by 1 unit if sugar is elevated 11/14/17   [provider]  Insulin Glargine (BASAGLAR KWIKPEN) 100 UNIT/ML SOPN Inject 60 Units into the skin at bedtime.    [provider]  isosorbide-hydrALAZINE (BIDIL) 20-37.5 MG tablet Take 2 tablets by mouth 3 (three) times daily. Patient taking differently: Take 1 tablet by mouth 3 (three) times daily.  09/18/18   Patwardhan, Reynold Bowen, MD  ketoconazole (NIZORAL) 2 % cream Apply 1 application topically 2 (two) times daily. 03/07/18   [provider]  Levocetirizine Dihydrochloride (XYZAL ALLERGY 24HR PO) Take by mouth daily.    [provider]  linaclotide (LINZESS) 145 MCG CAPS capsule Take 145 mcg by mouth daily before breakfast.    [provider]  metFORMIN (GLUCOPHAGE) 1000 MG tablet Take 500 mg by mouth 2 (two) times daily with a meal.    [provider]  Multiple Vitamin (MULTIVITAMIN) tablet Take 1 tablet by mouth daily.    [provider]  NEEDLE, REUSABLE, 22 G 22G X 1-1/2" MISC 1 Units by Does not apply route as directed. For B12 IM inj 02/15/18   Elgergawy, Silver Huguenin, MD  omega-3 acid ethyl esters (LOVAZA) 1 g capsule Take 2 g by mouth 2 (two) times daily.    [provider]  ondansetron (ZOFRAN) 4 MG tablet Take 4 mg by mouth every 6 (six) hours as needed for nausea or vomiting.    [provider]  oxyCODONE-acetaminophen (PERCOCET/ROXICET) 5-325 MG tablet Take 1 tablet by mouth every 8 (eight) hours as needed. Patient  taking differently: Take 1 tablet by mouth 3 (three) times daily.  05/10/16   Orson Eva, MD  oxyCODONE-acetaminophen (PERCOCET/ROXICET) 5-325 MG tablet Take 1 tablet by mouth 3 (three) times daily.    [provider]  pantoprazole (PROTONIX) 40 MG tablet Take 40 mg by mouth daily.    [provider]  Polyvinyl Alcohol-Povidone PF (REFRESH) 1.4-0.6 % SOLN Place 1 drop into both eyes daily.  [provider]  pregabalin (LYRICA) 100 MG capsule Take 1 capsule (100 mg total) by mouth 3 (three) times daily. 05/10/16   Orson Eva, MD  rOPINIRole (REQUIP) 2 MG tablet Take 2 mg by mouth at bedtime.     [provider]  saccharomyces boulardii (FLORASTOR) 250 MG capsule Take 250 mg by mouth daily.    [provider]  Syringe/Needle, Disp, (SYRINGE 3CC/22GX1-1/2") 22G X 1-1/2" 3 ML MISC 1 Syringe by Does not apply route as directed. For b12 IM inj 02/15/18   Elgergawy, Silver Huguenin, MD  tamsulosin (FLOMAX) 0.4 MG CAPS capsule Take 0.4 mg by mouth daily.    [provider]  torsemide (DEMADEX) 20 MG tablet Take 20 mg by mouth daily. May take an additional tablet for increased swelling.    [provider]     Current Outpatient Medications  Medication Instructions   amLODipine (NORVASC) 10 mg, Oral, Daily   aspirin EC 81 mg, Oral, Daily   atorvastatin (LIPITOR) 40 mg, Oral, Daily   Basaglar KwikPen 60 Units, Subcutaneous, Daily at bedtime   carvedilol (COREG) 37.5 mg, Oral, 2 times daily   clobetasol (TEMOVATE) 0.05 % external solution 1 application, Topical, Daily PRN   clopidogrel (PLAVIX) 75 mg, Oral, Daily   clotrimazole-betamethasone (LOTRISONE) cream 1 application, Topical, As needed   collagenase (SANTYL) ointment Topical, Daily   doxycycline (VIBRAMYCIN) 100 mg, Oral, 2 times daily   fentaNYL (DURAGESIC) 50 mcg, Transdermal, every 72 hours   ferrous sulfate 325 mg, Oral, Daily with breakfast   fluticasone (CUTIVATE) 1.61 %  cream 1 application, Topical, 2 times daily PRN   folic acid (FOLVITE) 096 mcg, Oral, Daily   Glucosamine HCl (GLUCOSAMINE PO) 2,000 mg, Oral, Daily   HumaLOG KwikPen 15 Units, Subcutaneous, 3 times daily with meals, 15 units with breakfast, lunch, & dinner-- pt may increase dose by 1 unit if sugar is elevated   ketoconazole (NIZORAL) 2 % cream 1 application, Topical, 2 times daily   linaclotide (LINZESS) 145 mcg, Oral, Daily before breakfast   metFORMIN (GLUCOPHAGE) 500 mg, Oral, 2 times daily with meals   Multiple Vitamin (MULTIVITAMIN) tablet 1 tablet, Oral, Daily   NEEDLE, REUSABLE, 22 G 22G X 1-1/2" MISC 1 Units, Does not apply, As directed, For B12 IM inj   omega-3 acid ethyl esters (LOVAZA) 2 g, Oral, 2 times daily   ondansetron (ZOFRAN) 4 mg, Oral, Every 6 hours PRN   oxyCODONE-acetaminophen (PERCOCET/ROXICET) 5-325 MG tablet 1 tablet, Oral, Every 8 hours PRN   pantoprazole (PROTONIX) 40 mg, Oral, Daily   Polyvinyl Alcohol-Povidone PF (REFRESH) 1.4-0.6 % SOLN 1 drop, Both Eyes, Daily   pregabalin (LYRICA) 100 mg, Oral, 3 times daily   rOPINIRole (REQUIP) 2 mg, Oral, Daily at bedtime   Syringe/Needle, Disp, (SYRINGE 3CC/22GX1-1/2") 22G X 1-1/2" 3 ML MISC 1 Syringe, Does not apply, As directed, For b12 IM inj   tamsulosin (FLOMAX) 0.4 mg, Oral, Daily   torsemide (DEMADEX) 20 mg, Oral, Daily, May take an additional tablet for increased swelling.    Cardiac Studies:   Left popliteal artery atherectomy in June 2012 and left below knee PTA 2014 by Dr. Brunetta Jeans at [place].   Carotid artery duplex 03/01/2017: Right ICA stenosis less than 40%, greater than 50% right ECA stenosis. Patent left carotid endarterectomy site with evidence of mild hyperglycemia at the distal patch site.  No significant change from 03/23/2016.  Lexiscan sestamibi stress test 03/18/2017: 1. The resting electrocardiogram demonstrated normal sinus rhythm,  RBBB and no resting arrhythmias. Stress EKG is  non-diagnostic for ischemia as it a pharmacologic stress using Lexiscan. Occasional PVC noted. Stress symptoms included dyspnea, dizziness and chest pain. 2. The LV is dilated both at rest and stress images. The LV end diastolic volume was 323FT. Perfusion images reveal inferior soft tissue attenuation artifact. There is no ischemia or scar. Overall left ventricular systolic function was normal without regional wall motion abnormalities. The left ventricular ejection fraction was calculated to be 51%. This is a low risk study. Compared to the study done on 10/15/2014, inferior wall ischemia and associated inferior hypokinesis is no longer present.  Coronary angiogram 09/17/18: Widely patent stent stents. Normal LVEF. Coronary Angiogram 10/29/2014: CAD s/p stenting of the distal, mid and proximal RCA with implantation of 3 overlapping drug-eluting stent, from distal to proximal 2.5 x 38 mm, 2.75 x 38 mm and a 2.75 x 20 mm Promus premier DES.  Echocardiogram 09/16/2018:  Left ventricle: The cavity size was normal. There was moderate concentric hypertrophy. Systolic function was normal. The estimated ejection fraction was in the range of 55% to 60%. Features are consistent with a pseudonormal left ventricular filling pattern, with concomitant abnormal relaxation and increased filling pressure (grade 2 diastolic dysfunction). Doppler parameters are consistent with both elevated ventricular end-diastolic filling pressure and elevated left atrial filling pressure. Mitral valve: Calcified annulus. Left atrium: The atrium was mildly dilated. Inferior vena cava: The vessel was dilated. The respirophasic diameter changes was normal, may suggest elevated central venous pressure.  Assessment     ICD-10-CM   1. Coronary artery disease of native artery of native heart with stable angina pectoris ([place])  I25.118 EKG 12-Lead  2. Essential hypertension  I10   3. Peripheral artery disease (HCC)  I73.9   4. Trifascicular block   I45.3   5. Carotid stenosis, asymptomatic, bilateral  I65.23 PCV CAROTID DUPLEX (BILATERAL)    EKG 05/25/2019: Sinus rhythm at rate of 68 bpm with borderline first-degree AV block, borderline criteria for left atrial enlargement, left axis deviation, left anterior fascicular block.  Right bundle branch block. No e/o ischemia.  Trifascicular block. No significant change from  EKG 10/03/2018.  Recommendations:   Patient presents here for 6 month office visit and follow-up, coronary artery disease is remained stable.  Unfortunately his renal function has deteriorated and stage IV kidney disease, he is now being closely followed by nephrology.  Blood pressure is well controlled, fortunately has not had any recurrence of angina pectoris.  With regard to peripheral arterial disease, he has remained stable, he has had toe amputations due to diabetes mellitus and nonhealing foot ulcer on the left.  He has right BKA.  Trifascicular block is remained stable, no history to suggest syncope, dizziness.  With regard to carotid artery stenosis, it has been 2 years since he last had carotid duplex, I'll set this up.  I'll see him back in 6 months for follow-up.  No changes in the medications were done today.  Adrian Prows, MD, [facility] 05/25/2019, 3:05 PM [facility] [place] Pager: [phone] Office: [phone] If no answer Cell [phone]

## 2019-06-09 ENCOUNTER — Other Ambulatory Visit (HOSPITAL_COMMUNITY): Payer: Self-pay | Admitting: *Deleted

## 2019-06-09 ENCOUNTER — Other Ambulatory Visit: Payer: Self-pay

## 2019-06-09 ENCOUNTER — Ambulatory Visit (INDEPENDENT_AMBULATORY_CARE_PROVIDER_SITE_OTHER): Payer: 59

## 2019-06-09 DIAGNOSIS — I6523 Occlusion and stenosis of bilateral carotid arteries: Secondary | ICD-10-CM

## 2019-06-10 ENCOUNTER — Ambulatory Visit (HOSPITAL_COMMUNITY)
Admission: RE | Admit: 2019-06-10 | Discharge: 2019-06-10 | Disposition: A | Discharge status: Home / Self Care | Payer: 59 | Source: Ambulatory Visit | Attending: Nephrology | Admitting: Nephrology

## 2019-06-10 ENCOUNTER — Other Ambulatory Visit: Payer: Self-pay

## 2019-06-10 DIAGNOSIS — D631 Anemia in chronic kidney disease: Secondary | ICD-10-CM | POA: Diagnosis present

## 2019-06-10 DIAGNOSIS — N189 Chronic kidney disease, unspecified: Secondary | ICD-10-CM | POA: Insufficient documentation

## 2019-06-10 LAB — POCT HEMOGLOBIN-HEMACUE: Hemoglobin: 9.4 g/dL — ABNORMAL LOW (ref 13.0–17.0)

## 2019-06-10 MED ORDER — SODIUM CHLORIDE 0.9 % IV SOLN
510.0000 mg | Freq: Once | INTRAVENOUS | Status: AC
  Administered 2019-06-10: 510 mg via INTRAVENOUS
  Filled 2019-06-10: qty 510

## 2019-06-10 MED ORDER — EPOETIN ALFA-EPBX 10000 UNIT/ML IJ SOLN
20000.0000 [IU] | Freq: Once | INTRAMUSCULAR | Status: AC
  Administered 2019-06-10: 20000 [IU] via SUBCUTANEOUS
  Filled 2019-06-10: qty 2

## 2019-06-15 ENCOUNTER — Encounter: Payer: Self-pay | Admitting: *Deleted

## 2019-06-19 ENCOUNTER — Other Ambulatory Visit: Payer: Self-pay

## 2019-06-19 ENCOUNTER — Encounter (HOSPITAL_COMMUNITY): Payer: Self-pay | Admitting: Emergency Medicine

## 2019-06-19 ENCOUNTER — Emergency Department (HOSPITAL_COMMUNITY): Payer: 59

## 2019-06-19 ENCOUNTER — Emergency Department (HOSPITAL_COMMUNITY)
Admission: EM | Admit: 2019-06-19 | Discharge: 2019-06-19 | Disposition: A | Discharge status: Home / Self Care | Payer: 59 | Attending: Emergency Medicine | Admitting: Emergency Medicine

## 2019-06-19 DIAGNOSIS — Z79899 Other long term (current) drug therapy: Secondary | ICD-10-CM | POA: Diagnosis not present

## 2019-06-19 DIAGNOSIS — I10 Essential (primary) hypertension: Secondary | ICD-10-CM | POA: Insufficient documentation

## 2019-06-19 DIAGNOSIS — I2510 Atherosclerotic heart disease of native coronary artery without angina pectoris: Secondary | ICD-10-CM | POA: Diagnosis not present

## 2019-06-19 DIAGNOSIS — R1032 Left lower quadrant pain: Secondary | ICD-10-CM | POA: Diagnosis present

## 2019-06-19 DIAGNOSIS — R739 Hyperglycemia, unspecified: Secondary | ICD-10-CM

## 2019-06-19 DIAGNOSIS — Z87891 Personal history of nicotine dependence: Secondary | ICD-10-CM | POA: Diagnosis not present

## 2019-06-19 DIAGNOSIS — E1165 Type 2 diabetes mellitus with hyperglycemia: Secondary | ICD-10-CM | POA: Insufficient documentation

## 2019-06-19 DIAGNOSIS — J449 Chronic obstructive pulmonary disease, unspecified: Secondary | ICD-10-CM | POA: Diagnosis not present

## 2019-06-19 DIAGNOSIS — Z7982 Long term (current) use of aspirin: Secondary | ICD-10-CM | POA: Diagnosis not present

## 2019-06-19 DIAGNOSIS — Z7901 Long term (current) use of anticoagulants: Secondary | ICD-10-CM | POA: Diagnosis not present

## 2019-06-19 DIAGNOSIS — Z794 Long term (current) use of insulin: Secondary | ICD-10-CM | POA: Diagnosis not present

## 2019-06-19 DIAGNOSIS — R10817 Generalized abdominal tenderness: Secondary | ICD-10-CM | POA: Diagnosis not present

## 2019-06-19 LAB — COMPREHENSIVE METABOLIC PANEL
ALT: 10 U/L (ref 0–44)
AST: 17 U/L (ref 15–41)
Albumin: 2.9 g/dL — ABNORMAL LOW (ref 3.5–5.0)
Alkaline Phosphatase: 107 U/L (ref 38–126)
Anion gap: 12 (ref 5–15)
BUN: 17 mg/dL (ref 8–23)
CO2: 20 mmol/L — ABNORMAL LOW (ref 22–32)
Calcium: 7.8 mg/dL — ABNORMAL LOW (ref 8.9–10.3)
Chloride: 102 mmol/L (ref 98–111)
Creatinine, Ser: 2.91 mg/dL — ABNORMAL HIGH (ref 0.61–1.24)
GFR calc Af Amer: 25 mL/min — ABNORMAL LOW (ref >60)
GFR calc non Af Amer: 22 mL/min — ABNORMAL LOW (ref >60)
Glucose, Bld: 313 mg/dL — ABNORMAL HIGH (ref 70–99)
Potassium: 3.2 mmol/L — ABNORMAL LOW (ref 3.5–5.1)
Sodium: 134 mmol/L — ABNORMAL LOW (ref 135–145)
Total Bilirubin: 0.7 mg/dL (ref 0.3–1.2)
Total Protein: 5.6 g/dL — ABNORMAL LOW (ref 6.5–8.1)

## 2019-06-19 LAB — URINALYSIS, ROUTINE W REFLEX MICROSCOPIC
Bilirubin Urine: NEGATIVE
Glucose, UA: >=500 mg/dL — AB
Hgb urine dipstick: NEGATIVE
Ketones, ur: NEGATIVE mg/dL
Leukocytes,Ua: NEGATIVE
Nitrite: NEGATIVE
Protein, ur: >=300 mg/dL — AB
Specific Gravity, Urine: 1.017 (ref 1.005–1.030)
pH: 5 (ref 5.0–8.0)

## 2019-06-19 LAB — CBC
HCT: 29.9 % — ABNORMAL LOW (ref 39.0–52.0)
Hemoglobin: 10.3 g/dL — ABNORMAL LOW (ref 13.0–17.0)
MCH: 28.1 pg (ref 26.0–34.0)
MCHC: 34.4 g/dL (ref 30.0–36.0)
MCV: 81.7 fL (ref 80.0–100.0)
Platelets: 200 10*3/uL (ref 150–400)
RBC: 3.66 MIL/uL — ABNORMAL LOW (ref 4.22–5.81)
RDW: 14.6 % (ref 11.5–15.5)
WBC: 7.9 10*3/uL (ref 4.0–10.5)
nRBC: 0 % (ref 0.0–0.2)

## 2019-06-19 LAB — LIPASE, BLOOD: Lipase: 23 U/L (ref 11–51)

## 2019-06-19 MED ORDER — SODIUM CHLORIDE 0.9 % IV BOLUS
1000.0000 mL | Freq: Once | INTRAVENOUS | Status: AC
  Administered 2019-06-19: 1000 mL via INTRAVENOUS

## 2019-06-19 MED ORDER — ONDANSETRON HCL 4 MG/2ML IJ SOLN
4.0000 mg | Freq: Once | INTRAMUSCULAR | Status: AC
  Administered 2019-06-19: 4 mg via INTRAVENOUS
  Filled 2019-06-19: qty 2

## 2019-06-19 MED ORDER — SODIUM CHLORIDE 0.9% FLUSH: 3.0000 mL | Freq: Once | INTRAVENOUS | Status: DC

## 2019-06-19 MED ORDER — BARIUM SULFATE 2.1 % PO SUSP
ORAL | Status: AC
  Filled 2019-06-19: qty 2

## 2019-06-19 NOTE — ED Provider Notes (Signed)
[facility] EMERGENCY DEPARTMENT Provider Note   CSN: [id number] Arrival date & time: 06/19/19  0017     History   Chief Complaint Chief Complaint  Patient presents with  . Abdominal Pain    HPI [name] is a 63 y.o. male.     The history is provided by the patient and medical records. No language interpreter was used.  Abdominal Pain    63 year old male with known history of diverticular disease, COPD, GERD, hypertension, hyperlipidemia, neuromuscular disorder presents ED for evaluation abdominal pain.  Patient report for the past 2 weeks he has had recurrent pain to his left lower side abdomen.  He described pain as a sharp sensation, focal, worsening with movement and moderate in severity.  He also noticed pain to his suprapubic region for the past month without any urinary discomfort.  His abdominal pain is somewhat similar to prior divided diverticular disease.  He does not complain of any fever chills no chest pain shortness of breath productive cough or change in his eating habit.  He does complain of persistent nausea with occasional vomiting for the past few days.  States he vomited up some greenish emesis at some point.  He report prior history of cholecystectomy.  Patient does have chronic pain from his peripheral neuropathy and currently wearing fentanyl patch and takes oxycodone regular interval basis.  Past Medical History:  Diagnosis Date  . Carotid artery occlusion 11/10/10   LEFT CAROTID ENDARTERECTOMY  . Complication of anesthesia    BP WENT UP AT [facility] "  . COPD (chronic obstructive pulmonary disease) ([place])   . Diverticulitis   . Diverticulosis of colon (without mention of hemorrhage)   . DJD (degenerative joint disease)   . Fatty liver   . Full dentures   . GERD (gastroesophageal reflux disease)   . H/O hiatal hernia   . Hyperlipidemia   . Hypertension   . Neuromuscular disorder ([place])    peripheral neuropathy  . Non-pressure chronic  ulcer of other part of left foot limited to breakdown of skin ([place]) 11/12/2016  . Osteomyelitis ([place])    left 5th metatarsal  . PAD (peripheral artery disease) ([place])    Distal aortogram June 2012. Atherectomy left popliteal artery July 2012.   . Pseudoclaudication 11/15/2018  . Slurred speech    AS PER WIFE IN D/C NOTE 11/10/10  . 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 Unstable angina ([place]) 09/16/2018  . Wears glasses     Patient Active Problem List   Diagnosis Date Noted  . 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 Pseudoclaudication 11/15/2018  . Essential hypertension 09/18/2018  . S/P BKA (below knee amputation), right ([place]) 09/18/2018  . Amputation of toe of left foot ([place]) 09/18/2018  . Peripheral artery disease ([place]) 09/18/2018  . S/P carotid endarterectomy 09/18/2018  . OSA on CPAP 09/18/2018  . Chronic back pain 09/18/2018  . Status post reversal of ileostomy 05/21/2018  . Normocytic anemia 02/14/2018  . Intra-abdominal abscess ([place]) 11/04/2017  . Chronic venous hypertension (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 ([place]) 12/11/2016  . Unilateral primary osteoarthritis, left knee 10/11/2016  . History of right below knee amputation ([place]) 07/12/2016  . Diabetic foot infection ([place]) 05/04/2016  . Type 2 diabetes mellitus with insulin therapy ([place]) 12/16/2015  . Amputated toe ([place]) 10/21/2015  . Leg edema, left 09/03/2015  . CAD (dz of distal, mid and proximal RCA with implantation of 3 overlapping drug-eluting stent,) 09/03/2015  . Chronic diastolic heart  failure, NYHA class 2 ([place]) 09/03/2015  . Angina pectoris associated with type 2 diabetes mellitus ([place]) 10/28/2014  . Hyperlipidemia 08/25/2014  . Chronic total occlusion of artery of the extremities ([place]) 04/08/2012  . Onychomycosis 02/01/2012  . Occlusion and stenosis of carotid artery without mention of cerebral infarction 06/12/2011  . GERD (gastroesophageal reflux disease) 05/08/2011  . Barrett's esophagus  without dysplasia 05/08/2011  . Former tobacco use 02/01/2011    Past Surgical History:  Procedure Laterality Date  . AMPUTATION  11/05/2011   Procedure: AMPUTATION RAY;  Surgeon: Wylene Simmer, MD;  Location: [facility];  Service: Orthopedics;  Laterality: Right;  Amputation of Right 4&5th Toes  . AMPUTATION Left 11/26/2012   Procedure: AMPUTATION RAY;  Surgeon: Wylene Simmer, MD;  Location: [facility];  Service: Orthopedics;  Laterality: Left;  fourth ray amputation  . AMPUTATION Right 08/27/2014   Procedure: Transmetatarsal Amputation;  Surgeon: Newt Minion, MD;  Location: [facility];  Service: Orthopedics;  Laterality: Right;  . AMPUTATION Right 01/14/2015   Procedure: AMPUTATION BELOW KNEE;  Surgeon: Newt Minion, MD;  Location: [facility];  Service: Orthopedics;  Laterality: Right;  . AMPUTATION Left 10/21/2015   Procedure: Left Foot 5th Ray Amputation;  Surgeon: Newt Minion, MD;  Location: [facility];  Service: Orthopedics;  Laterality: Left;  . ANTERIOR FUSION CERVICAL SPINE  02/06/06   C4-5, C5-6, C6-7; SURGEON DR. MAX COHEN  . BACK SURGERY    . BELOW KNEE LEG AMPUTATION Right   . CARDIAC CATHETERIZATION  10/31/04   2009  . CAROTID ENDARTERECTOMY  11/10/10  . CAROTID ENDARTERECTOMY Left 11/10/2010   Subtotal occlusion of left internal carotid artery with left hemispheric transient ischemic attacks.  . CAROTID STENT    . CARPAL TUNNEL RELEASE Right 10/21/2013   Procedure: RIGHT CARPAL TUNNEL RELEASE;  Surgeon: Wynonia Sours, MD;  Location: [facility];  Service: Orthopedics;  Laterality: Right;  . CHOLECYSTECTOMY    . COLON SURGERY    . COLONOSCOPY    . COLOSTOMY REVERSAL  05/21/2018   ileostomy reversal  . CYSTOSCOPY WITH STENT PLACEMENT Bilateral 01/13/2018   Procedure: CYSTOSCOPY WITH BILATERAL URETERAL CATHETER PLACEMENT;  Surgeon: Ardis Hughs, MD;  Location: [facility];  Service: Urology;  Laterality: Bilateral;  . ESOPHAGEAL MANOMETRY Bilateral 07/19/2014   Procedure: ESOPHAGEAL MANOMETRY  (EM);  Surgeon: Jerene Bears, MD;  Location: [facility];  Service: Gastroenterology;  Laterality: Bilateral;  . FEMORAL ARTERY STENT     x6  . FINGER SURGERY    . FOOT SURGERY  04/25/2016    EXCISION BASE 5TH METATARSAL AND PARTIAL CUBOID LEFT FOOT  . HERNIA REPAIR     LEFT INGUINAL AND UMBILICAL REPAIRS  . HERNIA REPAIR    . I&D EXTREMITY Left 04/25/2016   Procedure: EXCISION BASE 5TH METATARSAL AND PARTIAL CUBOID LEFT FOOT;  Surgeon: Newt Minion, MD;  Location: [facility];  Service: Orthopedics;  Laterality: Left;  . ILEOSTOMY  01/13/2018   Procedure: ILEOSTOMY;  Surgeon: Clovis Riley, MD;  Location: [facility];  Service: General;;  . ILEOSTOMY CLOSURE N/A 05/21/2018   Procedure: ILEOSTOMY REVERSAL ERAS PATHWAY;  Surgeon: Clovis Riley, MD;  Location: [facility];  Service: General;  Laterality: N/A;  . IR RADIOLOGIST EVAL & MGMT  11/19/2017  . IR RADIOLOGIST EVAL & MGMT  12/03/2017  . IR RADIOLOGIST EVAL & MGMT  12/18/2017  . JOINT REPLACEMENT Right 2001   Total  . LAMINECTOMY     X  3 LUMBAR AND X 2 CERVICAL SPINE OPERATIONS  . LAPAROSCOPIC CHOLECYSTECTOMY W/ CHOLANGIOGRAPHY  11/09/04   SURGEON DR. Luella Cook  . LEFT HEART CATH AND CORONARY ANGIOGRAPHY N/A 09/16/2018   Procedure: LEFT HEART CATH AND CORONARY ANGIOGRAPHY;  Surgeon: Nigel Mormon, MD;  Location: [facility];  Service: Cardiovascular;  Laterality: N/A;  . LEFT HEART CATHETERIZATION WITH CORONARY ANGIOGRAM N/A 10/29/2014   Procedure: LEFT HEART CATHETERIZATION WITH CORONARY ANGIOGRAM;  Surgeon: Laverda Page, MD;  Location: [facility];  Service: Cardiovascular;  Laterality: N/A;  . LOWER EXTREMITY ANGIOGRAM N/A 03/19/2012   Procedure: LOWER EXTREMITY ANGIOGRAM;  Surgeon: Burnell Blanks, MD;  Location: [facility];  Service: Cardiovascular;  Laterality: N/A;  . NECK SURGERY    . PARTIAL COLECTOMY N/A 01/13/2018   Procedure: LAPAROSCOPIC ASSISTED   SIGMOID COLECTOMY ILEOSTOMY;  Surgeon: Clovis Riley,  MD;  Location: [facility];  Service: General;  Laterality: N/A;  . PENILE PROSTHESIS IMPLANT  08/14/05   INFRAPUBIC INSERTION OF INFLATABLE PENILE PROSTHESIS; SURGEON DR. Amalia Hailey  . PENILE PROSTHESIS IMPLANT    . PERCUTANEOUS CORONARY STENT INTERVENTION (PCI-S) Right 10/29/2014   Procedure: PERCUTANEOUS CORONARY STENT INTERVENTION (PCI-S);  Surgeon: Laverda Page, MD;  Location: [facility];  Service: Cardiovascular;  Laterality: Right;  . SHOULDER ARTHROSCOPY    . SPINE SURGERY    . TOE AMPUTATION Left   . TONSILLECTOMY    . TOTAL KNEE ARTHROPLASTY  07/2002   RIGHT KNEE ; SURGEON  DR. [name] ALSO HAD ARTHROSCOPIC RIGHT KNEE IN  10/2001  . TOTAL KNEE ARTHROPLASTY    . ULNAR NERVE TRANSPOSITION Right 10/21/2013   Procedure: RIGHT ELBOW  ULNAR NERVE DECOMPRESSION;  Surgeon: Wynonia Sours, MD;  Location: [facility];  Service: Orthopedics;  Laterality: Right;        Home Medications    Prior to Admission medications   Medication Sig Start Date End Date Taking? Authorizing Provider  amLODipine (NORVASC) 10 MG tablet Take 1 tablet (10 mg total) by mouth daily. 09/18/18   Patwardhan, Reynold Bowen, MD  aspirin EC 81 MG tablet Take 81 mg by mouth daily.    [provider]  atorvastatin (LIPITOR) 40 MG tablet Take 40 mg by mouth daily.    [provider]  carvedilol (COREG) 25 MG tablet Take 1.5 tablets (37.5 mg total) by mouth 2 (two) times daily. 08/01/16   Adrian Prows, MD  clobetasol (TEMOVATE) 0.05 % external solution Apply 1 application topically daily as needed (dry scalp).    [provider]  clopidogrel (PLAVIX) 75 MG tablet Take 75 mg by mouth daily.  03/28/12   [provider]  clotrimazole-betamethasone (LOTRISONE) cream Apply 1 application topically as needed (on affected areas on skin).    [provider]  collagenase (SANTYL) ointment Apply topically daily. Patient taking differently: Apply 1 application topically daily as needed  (wound care). TO AFFECTED SITE(S) 09/06/15   Lavina Hamman, MD  doxycycline (VIBRAMYCIN) 100 MG capsule Take 1 capsule (100 mg total) by mouth 2 (two) times daily. 02/24/19   Rayburn, Neta Mends, PA-C  fentaNYL (DURAGESIC - DOSED MCG/HR) 50 MCG/HR Place 50 mcg onto the skin every 3 (three) days.    [provider]  ferrous sulfate 325 (65 FE) MG tablet Take 325 mg by mouth daily with breakfast.    [provider]  fluticasone (CUTIVATE) 0.05 % cream Apply 1 application topically 2 (two) times daily as needed (  dry skin on face).     [provider]  folic acid (FOLVITE) 175 MCG tablet Take 800 mcg by mouth daily.     [provider]  Glucosamine HCl (GLUCOSAMINE PO) Take 2,000 mg by mouth daily.     [provider]  HUMALOG KWIKPEN 100 UNIT/ML KiwkPen Inject 15 Units into the skin 3 (three) times daily with meals. 15 units with breakfast, lunch, & dinner-- pt may increase dose by 1 unit if sugar is elevated 11/14/17   [provider]  Insulin Glargine (BASAGLAR KWIKPEN) 100 UNIT/ML SOPN Inject 60 Units into the skin at bedtime.    [provider]  ketoconazole (NIZORAL) 2 % cream Apply 1 application topically 2 (two) times daily. 03/07/18   [provider]  linaclotide (LINZESS) 145 MCG CAPS capsule Take 145 mcg by mouth daily before breakfast.    [provider]  metFORMIN (GLUCOPHAGE) 1000 MG tablet Take 500 mg by mouth 2 (two) times daily with a meal.    [provider]  Multiple Vitamin (MULTIVITAMIN) tablet Take 1 tablet by mouth daily.    [provider]  NEEDLE, REUSABLE, 22 G 22G X 1-1/2" MISC 1 Units by Does not apply route as directed. For B12 IM inj 02/15/18   Elgergawy, Silver Huguenin, MD  omega-3 acid ethyl esters (LOVAZA) 1 g capsule Take 2 g by mouth 2 (two) times daily.    [provider]  ondansetron (ZOFRAN) 4 MG tablet Take 4 mg by mouth every 6 (six) hours as needed for nausea or  vomiting.    [provider]  oxyCODONE-acetaminophen (PERCOCET/ROXICET) 5-325 MG tablet Take 1 tablet by mouth every 8 (eight) hours as needed. Patient taking differently: Take 1 tablet by mouth 3 (three) times daily.  05/10/16   Orson Eva, MD  pantoprazole (PROTONIX) 40 MG tablet Take 40 mg by mouth daily.    [provider]  Polyvinyl Alcohol-Povidone PF (REFRESH) 1.4-0.6 % SOLN Place 1 drop into both eyes daily.    [provider]  pregabalin (LYRICA) 100 MG capsule Take 1 capsule (100 mg total) by mouth 3 (three) times daily. 05/10/16   Orson Eva, MD  rOPINIRole (REQUIP) 2 MG tablet Take 2 mg by mouth at bedtime.     [provider]  Syringe/Needle, Disp, (SYRINGE 3CC/22GX1-1/2") 22G X 1-1/2" 3 ML MISC 1 Syringe by Does not apply route as directed. For b12 IM inj 02/15/18   Elgergawy, Silver Huguenin, MD  tamsulosin (FLOMAX) 0.4 MG CAPS capsule Take 0.4 mg by mouth daily.    [provider]  torsemide (DEMADEX) 20 MG tablet Take 20 mg by mouth daily. May take an additional tablet for increased swelling.    [provider]    Family History Family History  Problem Relation Age of Onset  . Heart disease Father        Before age 63-  CAD, BPG  . Diabetes Father        Amputation  . Cancer Father        PROSTATE  . Hyperlipidemia Father   . Hypertension Father   . Heart attack Father        Triple BPG  . Varicose Veins Father   . Colon cancer Brother   . Diabetes Brother   . Heart disease Brother 82       A-Fib. Before age 36  . Hyperlipidemia Brother   . Hypertension Brother   . Cancer Sister  Breast  . Hyperlipidemia Sister   . Hypertension Sister   . Hypertension Son   . Arthritis Other        GRANDMOTHER  . Hypertension Other        OTHER FAMILY MEMBERS    Social History Social History   Tobacco Use  . Smoking status: Former Smoker    Packs/day: 2.00    Years: 35.00    Pack years: 70.00    Types: Cigarettes     Quit date: 10/28/2011    Years since quitting: 7.6  . Smokeless tobacco: Never Used  Substance Use Topics  . Alcohol use: Not Currently    Comment: "not in a long time"  . Drug use: Not Currently     Allergies   Contrast media [iodinated diagnostic agents], Ivp dye [iodinated diagnostic agents], and Adhesive [tape]   Review of Systems Review of Systems  Gastrointestinal: Positive for abdominal pain.  All other systems reviewed and are negative.    Physical Exam Updated Vital Signs BP (!) 186/83   Pulse 76   Temp 98.3 F (36.8 C) (Oral)   Resp 20   Ht 6\' 2"  (1.88 m)   Wt 124.3 kg   SpO2 96%   BMI 35.18 kg/m   Physical Exam Vitals signs and nursing note reviewed.  Constitutional:      General: He is not in acute distress.    Appearance: He is well-developed. He is obese.  HENT:     Head: Atraumatic.  Eyes:     Conjunctiva/sclera: Conjunctivae normal.  Neck:     Musculoskeletal: Neck supple.  Cardiovascular:     Rate and Rhythm: Normal rate and regular rhythm.  Pulmonary:     Effort: Pulmonary effort is normal.     Breath sounds: Normal breath sounds.  Abdominal:     General: Abdomen is flat.     Palpations: Abdomen is soft.     Tenderness: There is generalized abdominal tenderness (Diffuse abdominal tenderness without focal point tenderness.  No significant left lower quadrant tenderness on palpation.).  Skin:    Findings: No rash.  Neurological:     Mental Status: He is alert.  Psychiatric:        Mood and Affect: Mood normal.      ED Treatments / Results  Labs (all labs ordered are listed, but only abnormal results are displayed) Labs Reviewed  COMPREHENSIVE METABOLIC PANEL - Abnormal; Notable for the following components:      Result Value   Sodium 134 (*)    Potassium 3.2 (*)    CO2 20 (*)    Glucose, Bld 313 (*)    Creatinine, Ser 2.91 (*)    Calcium 7.8 (*)    Total Protein 5.6 (*)    Albumin 2.9 (*)    GFR calc non Af Amer 22 (*)     GFR calc Af Amer 25 (*)    All other components within normal limits  CBC - Abnormal; Notable for the following components:   RBC 3.66 (*)    Hemoglobin 10.3 (*)    HCT 29.9 (*)    All other components within normal limits  URINALYSIS, ROUTINE W REFLEX MICROSCOPIC - Abnormal; Notable for the following components:   Glucose, UA >=500 (*)    Protein, ur >=300 (*)    Bacteria, UA RARE (*)    All other components within normal limits  LIPASE, BLOOD    EKG None  Radiology Ct Abdomen Pelvis Wo Contrast  Result Date: 06/19/2019 CLINICAL DATA:  Lower abdominal pain, left greater than right. Previous partial colectomy secondary to perforated diverticulitis. EXAM: CT ABDOMEN AND PELVIS WITHOUT CONTRAST TECHNIQUE: Multidetector CT imaging of the abdomen and pelvis was performed following the standard protocol without IV contrast. COMPARISON:  CT scan dated 01/03/2018 FINDINGS: Lower chest: No acute abnormality. Extensive coronary artery calcifications. Hepatobiliary: No focal liver abnormality is seen. Status post cholecystectomy. No biliary dilatation. Pancreas: Unremarkable. No pancreatic ductal dilatation or surrounding inflammatory changes. Spleen: Normal in size without focal abnormality. Adrenals/Urinary Tract: Adrenal glands are normal. Tiny stones in both kidneys, slightly increased on the left. No hydronephrosis. Bladder is normal. Stomach/Bowel: Stomach is within normal limits. Appendix appears normal. No evidence of bowel wall thickening, distention, or inflammatory changes. There are few scattered diverticula in the colon. Surgical staples in the mid sigmoid region secondary to previous partial colectomy. Vascular/Lymphatic: Aortic atherosclerosis. No adenopathy. Reproductive: Prostate is unremarkable. Penile prosthesis with reservoir in the right suprapubic region. Other: Small new midline abdominal wall hernia just above the umbilicus containing only a small amount of peritoneal fat.  Musculoskeletal: No acute abnormality. Previous lower lumbar spine fusion. Fairly severe spinal stenosis at L3-4. IMPRESSION: 1. No acute abnormalities of the abdomen or pelvis. 2. Small new midline abdominal wall hernia just above the umbilicus containing only a small amount of peritoneal fat. Aortic Atherosclerosis (ICD10-I70.0). Electronically Signed   By: Lorriane Shire M.D.   On: 06/19/2019 11:36    Procedures Procedures (including critical care time)  Medications Ordered in ED Medications  sodium chloride flush (NS) 0.9 % injection 3 mL (has no administration in time range)  Barium Sulfate 2.1 % SUSP (has no administration in time range)  ondansetron (ZOFRAN) injection 4 mg (4 mg Intravenous Given 06/19/19 0822)  sodium chloride 0.9 % bolus 1,000 mL (0 mLs Intravenous Stopped 06/19/19 1108)     Initial Impression / Assessment and Plan / ED Course  I have reviewed the triage vital signs and the nursing notes.  Pertinent labs & imaging results that were available during my care of the patient were reviewed by me and considered in my medical decision making (see chart for details).        BP (!) 149/73 (BP Location: Right Arm)   Pulse 71   Temp 98.3 F (36.8 C) (Oral)   Resp 20   Ht 6\' 2"  (1.88 m)   Wt 124.3 kg   SpO2 95%   BMI 35.18 kg/m    Final Clinical Impressions(s) / ED Diagnoses   Final diagnoses:  Left lower quadrant abdominal pain  Hyperglycemia    ED Discharge Orders    None     7:18 AM Patient with history of diverticular disease here with left lower quadrant abdominal pain.  No significant reproducible left lower quadrant pain on exam however patient does have diffuse abdominal tenderness and given his age and his comorbidity, will obtain abdominal pelvis CT scan for further evaluation.  Patient has history of chronic kidney disease therefore will avoid IV contrast.  12:32 PM Labs are reassuring, evidence of CKD with creatinine of 2.91 similar to  baseline.  Elevated CBG of 313 however normal anion gap.  Abdominal pelvis CT scan without any acute abnormalities.  This small new midline abdominal wall hernia just above the umbilicus containing only a small amount of peritoneal fat.  Pt currently resting comfortably.  I recommend monitoring his CBG closely and follow up with PCP for further evaluation of his abdominal pain.  Care discussed with Dr. Wilson Singer.    Domenic Moras, PA-C 06/19/19 1237    Virgel Manifold, MD 06/20/19 [phone]

## 2019-06-19 NOTE — ED Triage Notes (Addendum)
Pt to ED with c/o left lat abd pain and pain in lower abd.  Pt st's he has had nausea and vomiting off and on x's 1 week.  Pt st's onset of lower abd pain started approx 1 month ago  Pt is currently wearing a Fentanyl patch and has take Oxycodone

## 2019-06-19 NOTE — Discharge Instructions (Addendum)
Please follow up with your doctor for further evaluation of your abdominal pain.  No evidence of diverticulitis on your CT scan.  You do have an abdominal wall hernia on CT.  Your blood sugar is high today, please monitor and treat it with your home medication.  Return if you have any concerns.

## 2019-06-19 NOTE — ED Notes (Signed)
Pt dc'd home w/all belongings, a/o x4, driven home by friend  

## 2019-06-19 NOTE — ED Notes (Signed)
Pt has right BKKA with prosthetic on, has a brace on Left lower leg, with amputated toes on foot. Walks with a crutch. Has been drinking contrast for CT

## 2019-06-22 NOTE — Progress Notes (Signed)
Pt aware.

## 2019-07-07 ENCOUNTER — Other Ambulatory Visit (HOSPITAL_COMMUNITY): Payer: Self-pay | Admitting: *Deleted

## 2019-07-08 ENCOUNTER — Inpatient Hospital Stay (HOSPITAL_COMMUNITY)
Admission: RE | Admit: 2019-07-08 | Discharge: 2019-07-08 | Disposition: A | Discharge status: Home / Self Care | Payer: 59 | Source: Ambulatory Visit | Attending: Nephrology | Admitting: Nephrology

## 2019-07-09 ENCOUNTER — Other Ambulatory Visit: Payer: Self-pay

## 2019-07-09 ENCOUNTER — Ambulatory Visit (HOSPITAL_COMMUNITY)
Admission: RE | Admit: 2019-07-09 | Discharge: 2019-07-09 | Disposition: A | Discharge status: Home / Self Care | Payer: 59 | Source: Ambulatory Visit | Attending: Nephrology | Admitting: Nephrology

## 2019-07-09 DIAGNOSIS — D631 Anemia in chronic kidney disease: Secondary | ICD-10-CM | POA: Insufficient documentation

## 2019-07-09 DIAGNOSIS — N183 Chronic kidney disease, stage 3 unspecified: Secondary | ICD-10-CM | POA: Diagnosis present

## 2019-07-09 LAB — IRON AND TIBC
Iron: 42 ug/dL — ABNORMAL LOW (ref 45–182)
Saturation Ratios: 18 % (ref 17.9–39.5)
TIBC: 234 ug/dL — ABNORMAL LOW (ref 250–450)
UIBC: 192 ug/dL

## 2019-07-09 LAB — POCT HEMOGLOBIN-HEMACUE: Hemoglobin: 10.1 g/dL — ABNORMAL LOW (ref 13.0–17.0)

## 2019-07-09 LAB — FERRITIN: Ferritin: 152 ng/mL (ref 24–336)

## 2019-07-09 MED ORDER — EPOETIN ALFA-EPBX 10000 UNIT/ML IJ SOLN
20000.0000 [IU] | Freq: Once | INTRAMUSCULAR | Status: AC
  Administered 2019-07-09: 20000 [IU] via SUBCUTANEOUS
  Filled 2019-07-09: qty 2

## 2019-07-16 IMAGING — US US RENAL
1 series · 14 of 25 positions shown · non-contrast
Comparison: None.

CLINICAL DATA: Acute renal insufficiency superimposed on stage III
chronic renal disease

EXAM:
RENAL / URINARY TRACT ULTRASOUND COMPLETE

[Series 1: us renal · 0.25mm/px · 14 of 33 slices shown]
[im 1/33]
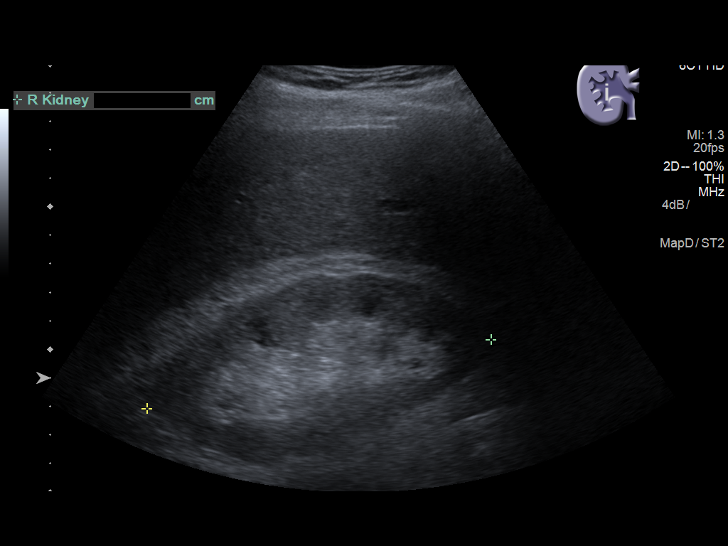
[im 3/33]
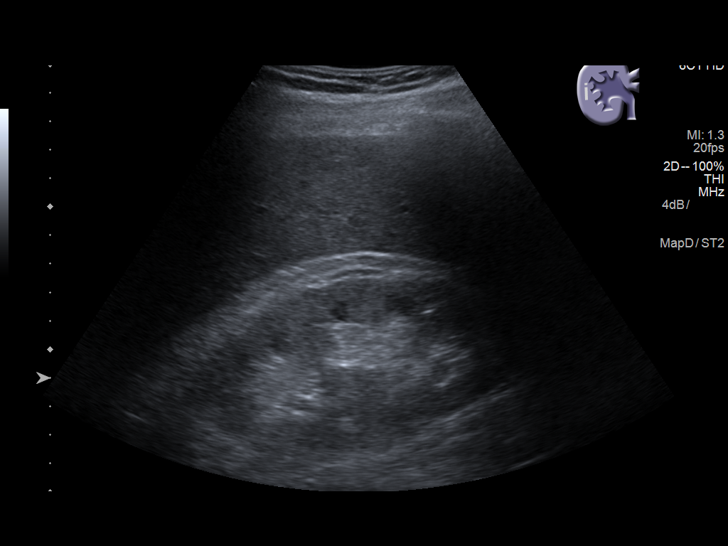
[im 6/33]
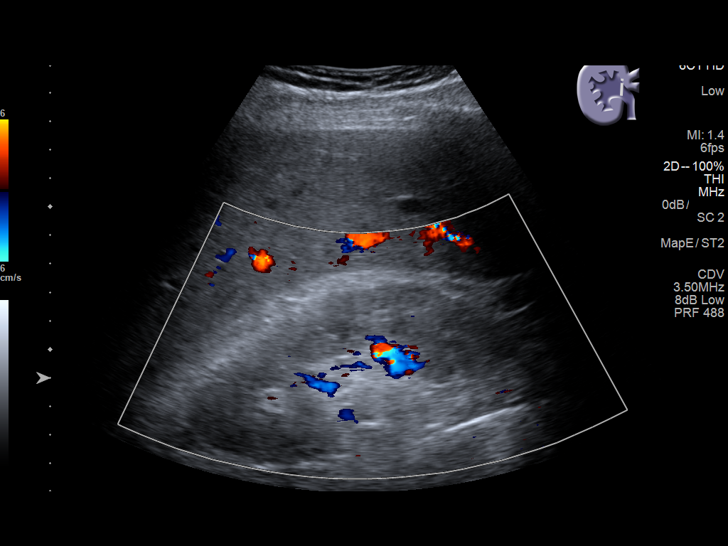
[im 9/33]
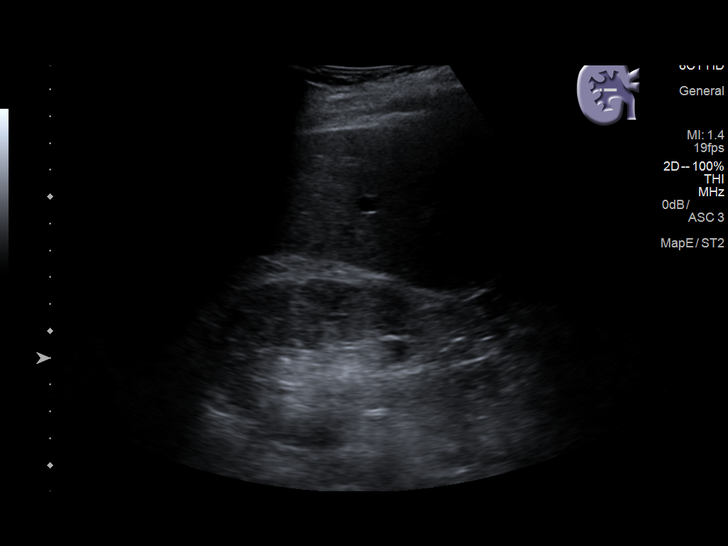
[im 11/33]
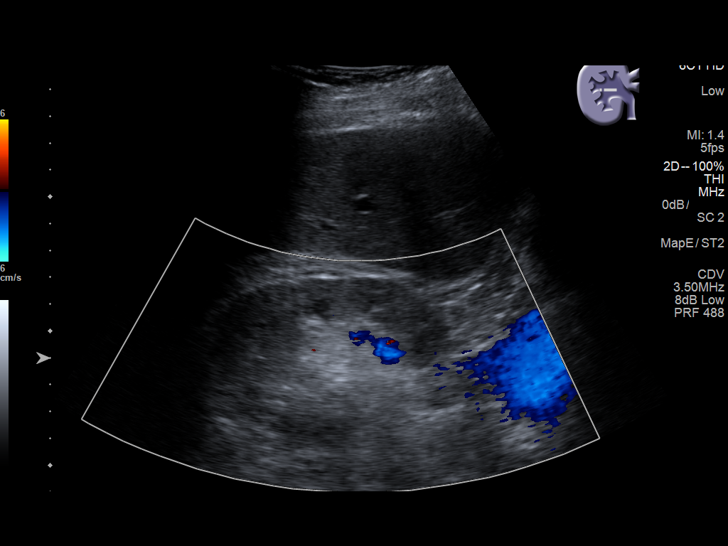
[im 13/33]
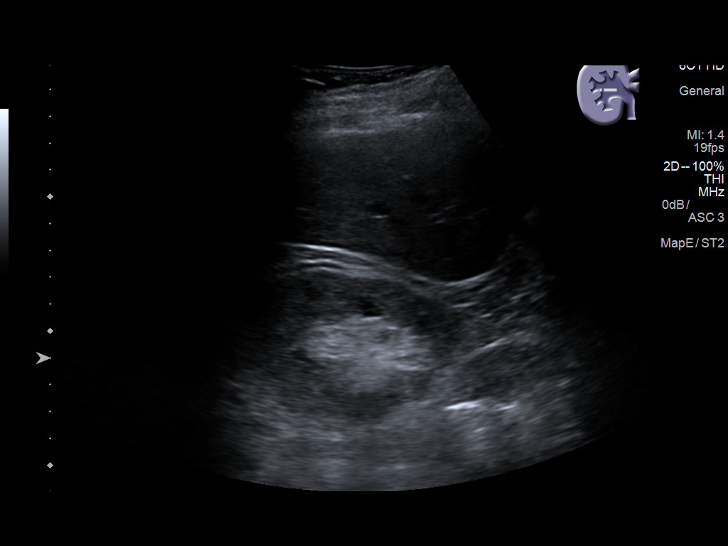
[im 15/33]
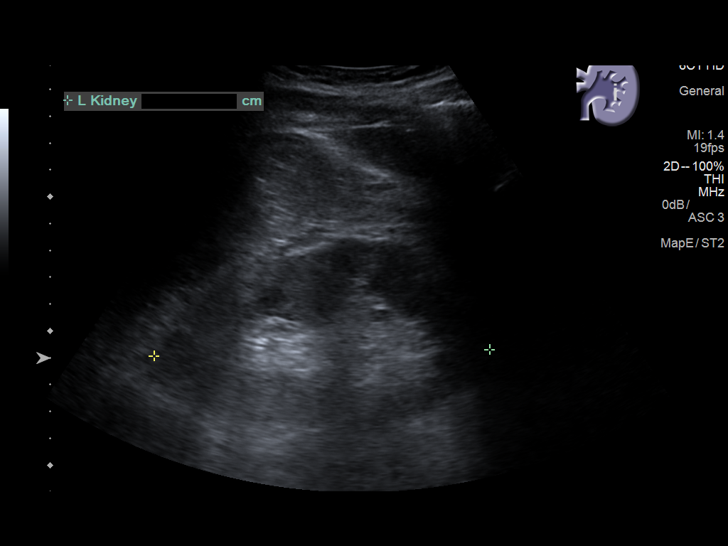
[im 18/33]
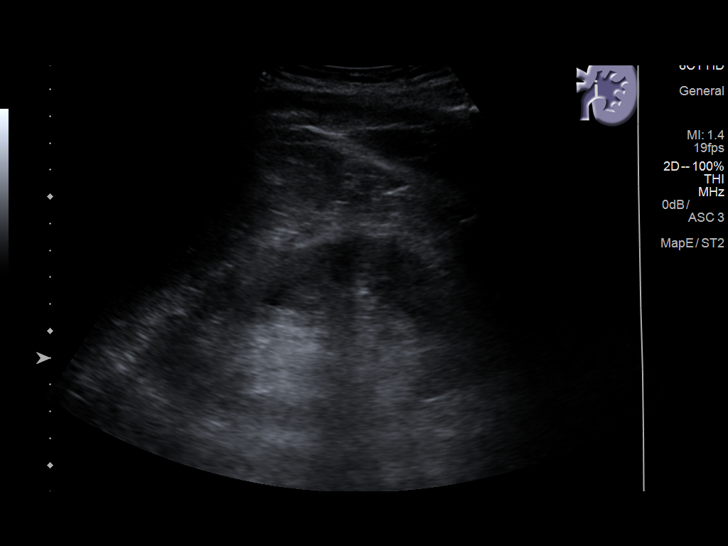
[im 21/33]
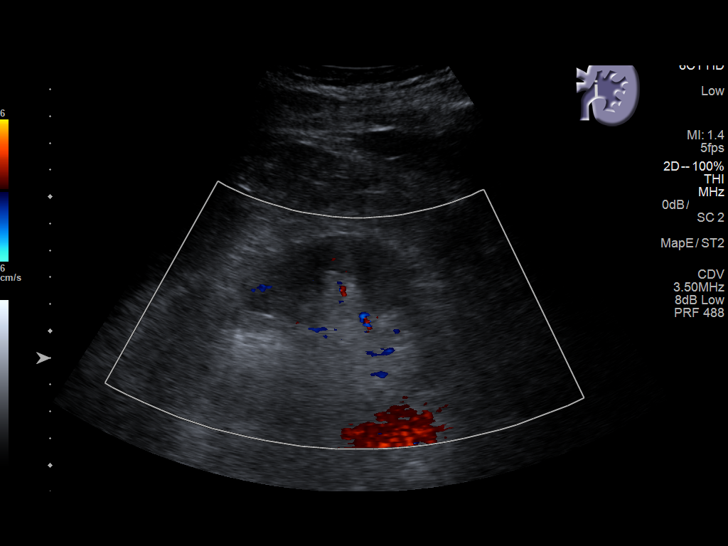
[im 22/33]
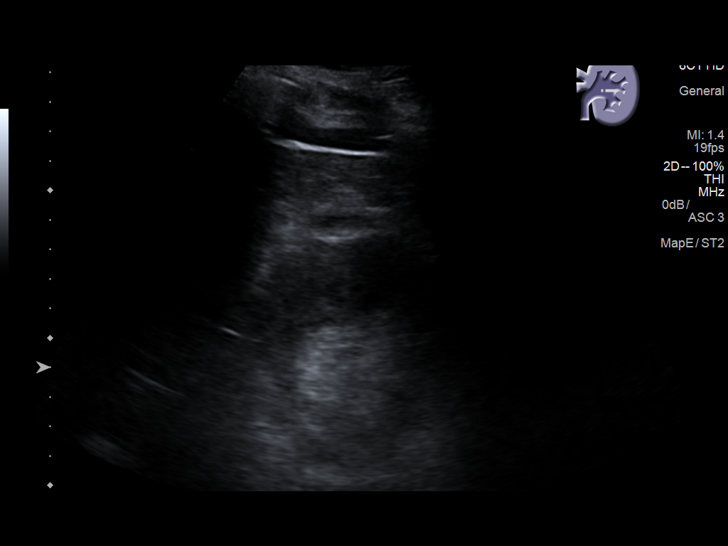
[im 25/33]
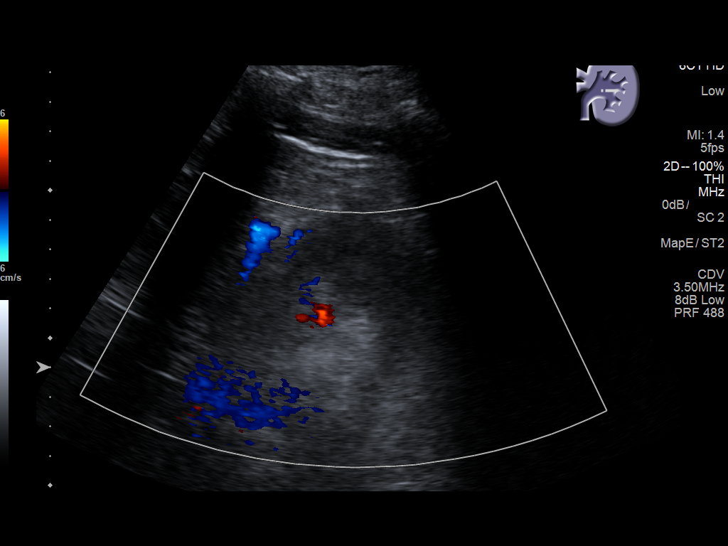
[im 27/33]
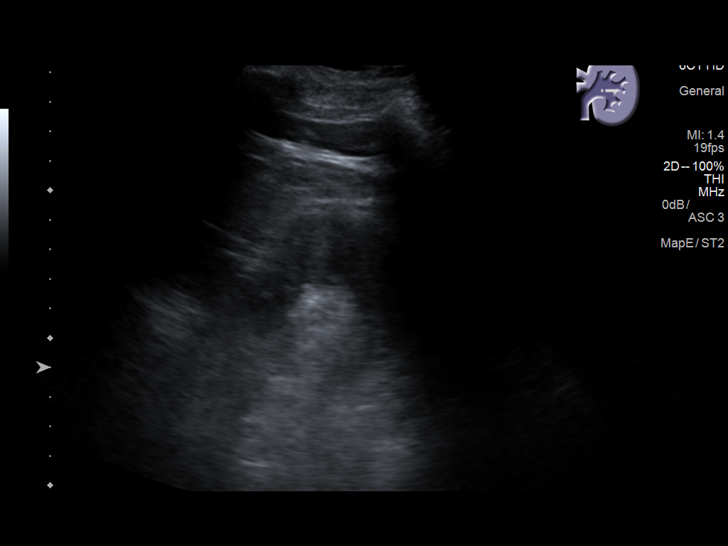
[im 30/33]
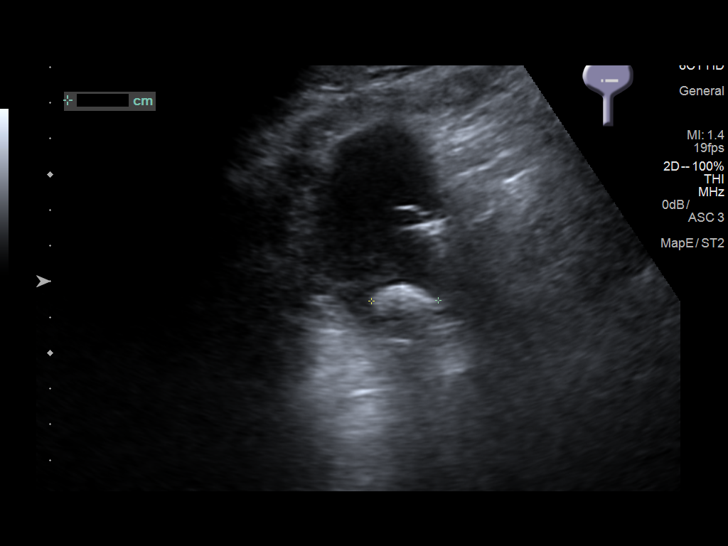
[im 33/33]
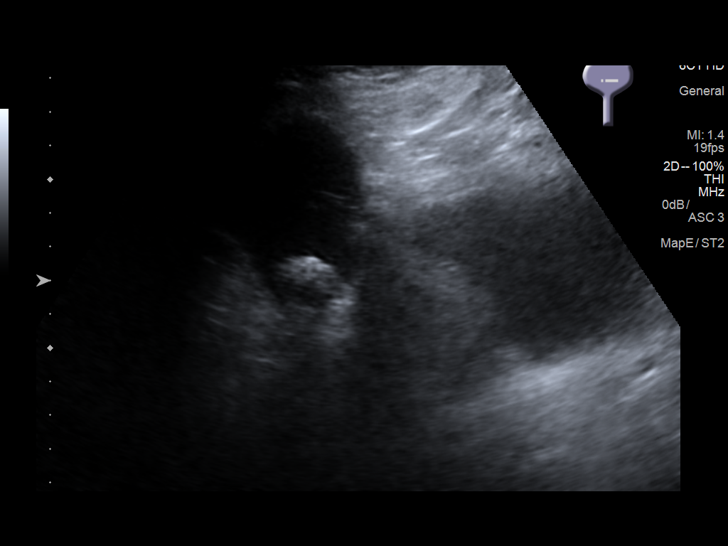

[14 of 25 positions shown; findings below may reference images not displayed]

FINDINGS: Right Kidney:

Length: 12.3 cm. Echogenicity is increased. Renal cortical thickness
is within normal limits. No mass, perinephric fluid, or
hydronephrosis visualized. No sonographically demonstrable calculus
or ureterectasis.

Left Kidney:

Length: 12.5 cm. Echogenicity is slightly increased. Renal cortical
thickness is normal. No mass, perinephric fluid, or hydronephrosis
visualized. No sonographically demonstrable calculus or
ureterectasis.

Bladder:

Appears normal for degree of bladder distention.

Penile prosthesis reservoir is present adjacent to the bladder.
Reservoir appears intact.
IMPRESSION: Kidneys appear somewhat echogenic, a finding indicative of medical
renal disease. Renal cortical thickness normal bilaterally. No
obstructing focus evident on either side. Kidneys otherwise appear
unremarkable.

Intact penile prosthesis reservoir noted in the pelvis.

## 2019-07-22 ENCOUNTER — Encounter: Payer: Self-pay | Admitting: Internal Medicine

## 2019-08-04 ENCOUNTER — Other Ambulatory Visit: Payer: Self-pay

## 2019-08-04 ENCOUNTER — Other Ambulatory Visit (HOSPITAL_COMMUNITY): Payer: Self-pay | Admitting: *Deleted

## 2019-08-04 MED ORDER — AMLODIPINE BESYLATE 10 MG PO TABS: 10.0000 mg | ORAL_TABLET | Freq: Every day | ORAL | 1 refills | Status: DC

## 2019-08-05 ENCOUNTER — Other Ambulatory Visit: Payer: Self-pay

## 2019-08-05 ENCOUNTER — Ambulatory Visit (HOSPITAL_COMMUNITY)
Admission: RE | Admit: 2019-08-05 | Discharge: 2019-08-05 | Disposition: A | Discharge status: Home / Self Care | Payer: 59 | Source: Ambulatory Visit | Attending: Nephrology | Admitting: Nephrology

## 2019-08-05 DIAGNOSIS — N183 Chronic kidney disease, stage 3 unspecified: Secondary | ICD-10-CM | POA: Diagnosis not present

## 2019-08-05 DIAGNOSIS — D631 Anemia in chronic kidney disease: Secondary | ICD-10-CM | POA: Insufficient documentation

## 2019-08-05 LAB — IRON AND TIBC
Iron: 71 ug/dL (ref 45–182)
Saturation Ratios: 29 % (ref 17.9–39.5)
TIBC: 248 ug/dL — ABNORMAL LOW (ref 250–450)
UIBC: 177 ug/dL

## 2019-08-05 LAB — FERRITIN: Ferritin: 291 ng/mL (ref 24–336)

## 2019-08-05 LAB — POCT HEMOGLOBIN-HEMACUE: Hemoglobin: 9.4 g/dL — ABNORMAL LOW (ref 13.0–17.0)

## 2019-08-05 MED ORDER — EPOETIN ALFA-EPBX 10000 UNIT/ML IJ SOLN
20000.0000 [IU] | Freq: Once | INTRAMUSCULAR | Status: DC
  Administered 2019-08-05: 20000 [IU] via SUBCUTANEOUS
  Filled 2019-08-05: qty 2

## 2019-09-02 ENCOUNTER — Other Ambulatory Visit: Payer: Self-pay

## 2019-09-02 ENCOUNTER — Encounter (HOSPITAL_COMMUNITY): Payer: 59

## 2019-09-02 ENCOUNTER — Encounter (HOSPITAL_COMMUNITY)
Admission: RE | Admit: 2019-09-02 | Discharge: 2019-09-02 | Disposition: A | Discharge status: Home / Self Care | Payer: 59 | Source: Ambulatory Visit | Attending: Nephrology | Admitting: Nephrology

## 2019-09-02 DIAGNOSIS — N183 Chronic kidney disease, stage 3 unspecified: Secondary | ICD-10-CM | POA: Insufficient documentation

## 2019-09-02 DIAGNOSIS — D631 Anemia in chronic kidney disease: Secondary | ICD-10-CM | POA: Insufficient documentation

## 2019-09-02 LAB — IRON AND TIBC
Iron: 58 ug/dL (ref 45–182)
Saturation Ratios: 23 % (ref 17.9–39.5)
TIBC: 256 ug/dL (ref 250–450)
UIBC: 198 ug/dL

## 2019-09-02 LAB — FERRITIN: Ferritin: 133 ng/mL (ref 24–336)

## 2019-09-02 LAB — POCT HEMOGLOBIN-HEMACUE: Hemoglobin: 8.5 g/dL — ABNORMAL LOW (ref 13.0–17.0)

## 2019-09-02 MED ORDER — EPOETIN ALFA-EPBX 10000 UNIT/ML IJ SOLN
INTRAMUSCULAR | Status: AC
  Filled 2019-09-02: qty 2

## 2019-09-02 MED ORDER — EPOETIN ALFA-EPBX 10000 UNIT/ML IJ SOLN
20000.0000 [IU] | Freq: Once | INTRAMUSCULAR | Status: DC
  Administered 2019-09-02: 20000 [IU] via SUBCUTANEOUS

## 2019-09-09 ENCOUNTER — Telehealth: Payer: Self-pay

## 2019-09-09 NOTE — Telephone Encounter (Signed)
NOTES ON FILE FROM [facility] [phone], SENT REFERRAL TO [place]

## 2019-09-14 ENCOUNTER — Ambulatory Visit: Payer: 59 | Admitting: Orthopedic Surgery

## 2019-09-14 NOTE — Progress Notes (Signed)
Cardiology Office Note:   Date:  09/15/2019  NAME:  [name]    MRN: [id number] DOB:  December 08, 1955   PCP:  [name], PA-C  Cardiologist:  No primary care provider on file.   Referring MD: [name], PA-C   Chief Complaint  Patient presents with  . Dizziness   History of Present Illness:   [name] is a 64 y.o. male with a hx of CAD, PAD, DM, CKD IV, trifascicular block who is being seen today for the evaluation of pre-syncope at the request of [name], PA-C.  [name] is a longstanding history of cardiovascular disease followed by Adrian Prows, MD at [facility].  He was referred to me for the evaluation of dizziness.  I did offer him the ability to follow with [name] and cancel his visit, however he reported since he was here he would proceed.  He reports for the last 3 months, 1-2 times per week he reports a feeling of dizziness in the evening.  It appears to last for 1 hour.  It occurs while sitting.  He also can get dizzy when standing and pursuing activity.  But it appears to mainly occur at night.  Deep breathing helps.  He does feel overall weak as well.  He has not had any syncope.  He reports he just feels like he is going to have a syncopal episode.  He denies any chest pain or significant shortness of breath.  He reports no major change in medications.  Review of his recent laboratory data shows an A1c of 6.9.  It appears his kidney function is in the CKD 3-4 range.  He is on a significant doses of insulin.  I do wonder about hypoglycemia given his rather low A1c which is not goal for him.  His goal should be between 7-8.  He reports no other complaints.  Problem List 1. CAD PCI pRCA/dRCA 2016 -LHC 09/16/2018 - > patent stent, normal LM, normal LCX, D1 50%, patent stents  2. PAD  -s/p R BKA -L toe amputation  3. Carotid artery disease -L CEA 2012 4. DM, A1c 6.9 5. CKD IV 6. Trifascicular Block  Past Medical History: Past  Medical History:  Diagnosis Date  . Carotid artery occlusion 11/10/10   LEFT CAROTID ENDARTERECTOMY  . Complication of anesthesia    BP WENT UP AT [facility] "  . COPD (chronic obstructive pulmonary disease) ([place])   . Diverticulitis   . Diverticulosis of colon (without mention of hemorrhage)   . DJD (degenerative joint disease)   . Fatty liver   . Full dentures   . GERD (gastroesophageal reflux disease)   . H/O hiatal hernia   . Hyperlipidemia   . Hypertension   . Neuromuscular disorder ([place])    peripheral neuropathy  . Non-pressure chronic ulcer of other part of left foot limited to breakdown of skin ([place]) 11/12/2016  . Osteomyelitis ([place])    left 5th metatarsal  . PAD (peripheral artery disease) ([place])    Distal aortogram June 2012. Atherectomy left popliteal artery July 2012.   . Pseudoclaudication 11/15/2018  . Slurred speech    AS PER WIFE IN D/C NOTE 11/10/10  . 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 Unstable angina ([place]) 09/16/2018  . Wears glasses     Past Surgical History: Past Surgical History:  Procedure Laterality Date  . AMPUTATION  11/05/2011   Procedure: AMPUTATION RAY;  Surgeon: Wylene Simmer, MD;  Location: [place];  Service:  Orthopedics;  Laterality: Right;  Amputation of Right 4&5th Toes  . AMPUTATION Left 11/26/2012   Procedure: AMPUTATION RAY;  Surgeon: Wylene Simmer, MD;  Location: [place];  Service: Orthopedics;  Laterality: Left;  fourth ray amputation  . AMPUTATION Right 08/27/2014   Procedure: Transmetatarsal Amputation;  Surgeon: Newt Minion, MD;  Location: [place];  Service: Orthopedics;  Laterality: Right;  . AMPUTATION Right 01/14/2015   Procedure: AMPUTATION BELOW KNEE;  Surgeon: Newt Minion, MD;  Location: [place];  Service: Orthopedics;  Laterality: Right;  . AMPUTATION Left 10/21/2015   Procedure: Left Foot 5th Ray Amputation;  Surgeon: Newt Minion, MD;  Location: [place];  Service: Orthopedics;  Laterality: Left;  . ANTERIOR FUSION CERVICAL SPINE  02/06/06   C4-5, C5-6,  C6-7; SURGEON DR. MAX COHEN  . BACK SURGERY    . BELOW KNEE LEG AMPUTATION Right   . CARDIAC CATHETERIZATION  10/31/04   2009  . CAROTID ENDARTERECTOMY  11/10/10  . CAROTID ENDARTERECTOMY Left 11/10/2010   Subtotal occlusion of left internal carotid artery with left hemispheric transient ischemic attacks.  . CAROTID STENT    . CARPAL TUNNEL RELEASE Right 10/21/2013   Procedure: RIGHT CARPAL TUNNEL RELEASE;  Surgeon: Wynonia Sours, MD;  Location: [place];  Service: Orthopedics;  Laterality: Right;  . CHOLECYSTECTOMY    . COLON SURGERY    . COLONOSCOPY    . COLOSTOMY REVERSAL  05/21/2018   ileostomy reversal  . CYSTOSCOPY WITH STENT PLACEMENT Bilateral 01/13/2018   Procedure: CYSTOSCOPY WITH BILATERAL URETERAL CATHETER PLACEMENT;  Surgeon: Ardis Hughs, MD;  [facility];  Service: Urology;  Laterality: Bilateral;  . ESOPHAGEAL MANOMETRY Bilateral 07/19/2014   Procedure: ESOPHAGEAL MANOMETRY (EM);  Surgeon: Jerene Bears, MD;  [facility];  Service: Gastroenterology;  Laterality: Bilateral;  . FEMORAL ARTERY STENT     x6  . FINGER SURGERY    . FOOT SURGERY  04/25/2016    EXCISION BASE 5TH METATARSAL AND PARTIAL CUBOID LEFT FOOT  . HERNIA REPAIR     LEFT INGUINAL AND UMBILICAL REPAIRS  . HERNIA REPAIR    . I & D EXTREMITY Left 04/25/2016   Procedure: EXCISION BASE 5TH METATARSAL AND PARTIAL CUBOID LEFT FOOT;  Surgeon: Newt Minion, MD;  Location: [place];  Service: Orthopedics;  Laterality: Left;  . ILEOSTOMY  01/13/2018   Procedure: ILEOSTOMY;  Surgeon: Clovis Riley, MD;  Location[facility];  Service: General;;  . ILEOSTOMY CLOSURE N/A 05/21/2018   Procedure: ILEOSTOMY REVERSAL ERAS PATHWAY;  Surgeon: Clovis Riley, MD;  Location: [place];  Service: General;  Laterality: N/A;  . IR RADIOLOGIST EVAL & MGMT  11/19/2017  . IR RADIOLOGIST EVAL & MGMT  12/03/2017  . IR RADIOLOGIST EVAL & MGMT  12/18/2017  . JOINT REPLACEMENT Right 2001   Total  . LAMINECTOMY      X 3 LUMBAR AND X 2 CERVICAL SPINE OPERATIONS  . LAPAROSCOPIC CHOLECYSTECTOMY W/ CHOLANGIOGRAPHY  11/09/04   SURGEON DR. Luella Cook  . LEFT HEART CATH AND CORONARY ANGIOGRAPHY N/A 09/16/2018   Procedure: LEFT HEART CATH AND CORONARY ANGIOGRAPHY;  Surgeon: Nigel Mormon, MD;  Location: [place] [facility];  Service: Cardiovascular;  Laterality: N/A;  . LEFT HEART CATHETERIZATION WITH CORONARY ANGIOGRAM N/A 10/29/2014   Procedure: LEFT HEART CATHETERIZATION WITH CORONARY ANGIOGRAM;  Surgeon: Laverda Page, MD;  Location: [facility];  Service: Cardiovascular;  Laterality: N/A;  . LOWER EXTREMITY ANGIOGRAM  N/A 03/19/2012   Procedure: LOWER EXTREMITY ANGIOGRAM;  Surgeon: Burnell Blanks, MD;  Location: [facility];  Service: Cardiovascular;  Laterality: N/A;  . NECK SURGERY    . PARTIAL COLECTOMY N/A 01/13/2018   Procedure: LAPAROSCOPIC ASSISTED   SIGMOID COLECTOMY ILEOSTOMY;  Surgeon: Clovis Riley, MD;  Location[facility];  Service: General;  Laterality: N/A;  . PENILE PROSTHESIS IMPLANT  08/14/05   INFRAPUBIC INSERTION OF INFLATABLE PENILE PROSTHESIS; SURGEON DR. Amalia Hailey  . PENILE PROSTHESIS IMPLANT    . PERCUTANEOUS CORONARY STENT INTERVENTION (PCI-S) Right 10/29/2014   Procedure: PERCUTANEOUS CORONARY STENT INTERVENTION (PCI-S);  Surgeon: Laverda Page, MD;  Location: [facility] C[facility];  Service: Cardiovascular;  Laterality: Right;  . SHOULDER ARTHROSCOPY    . SPINE SURGERY    . TOE AMPUTATION Left   . TONSILLECTOMY    . TOTAL KNEE ARTHROPLASTY  07/2002   RIGHT KNEE ; SURGEON  DR. [name] ALSO HAD ARTHROSCOPIC RIGHT KNEE IN  10/2001  . TOTAL KNEE ARTHROPLASTY    . ULNAR NERVE TRANSPOSITION Right 10/21/2013   Procedure: RIGHT ELBOW  ULNAR NERVE DECOMPRESSION;  Surgeon: Wynonia Sours, MD;  Location: [place];  Service: Orthopedics;  Laterality: Right;    Current Medications: Current Meds  Medication Sig  . amLODipine (NORVASC) 10 MG tablet Take 1 tablet (10 mg  total) by mouth daily.  Marland Kitchen aspirin EC 81 MG tablet Take 81 mg by mouth daily.  Marland Kitchen atorvastatin (LIPITOR) 40 MG tablet Take 40 mg by mouth daily.  . carvedilol (COREG) 25 MG tablet Take 1.5 tablets (37.5 mg total) by mouth 2 (two) times daily.  . clobetasol (TEMOVATE) 0.05 % external solution Apply 1 application topically daily as needed (dry scalp).  . clopidogrel (PLAVIX) 75 MG tablet Take 75 mg by mouth daily.   . clotrimazole-betamethasone (LOTRISONE) cream Apply 1 application topically as needed (on affected areas on skin).  . collagenase (SANTYL) ointment Apply topically daily. (Patient taking differently: Apply 1 application topically daily as needed (wound care). TO AFFECTED SITE(S))  . fentaNYL (DURAGESIC - DOSED MCG/HR) 50 MCG/HR Place 50 mcg onto the skin every 3 (three) days.  . ferrous sulfate 325 (65 FE) MG tablet Take 325 mg by mouth daily with breakfast.  . fluticasone (CUTIVATE) 0.05 % cream Apply 1 application topically 2 (two) times daily as needed (dry skin on face).   . folic acid (FOLVITE) 676 MCG tablet Take 800 mcg by mouth daily.   . Glucosamine HCl (GLUCOSAMINE PO) Take 2,000 mg by mouth daily.   Marland Kitchen HUMALOG KWIKPEN 100 UNIT/ML KiwkPen Inject 15 Units into the skin 3 (three) times daily with meals. 15 units with breakfast, lunch, & dinner-- pt may increase dose by 1 unit if sugar is elevated  . Insulin Glargine (BASAGLAR KWIKPEN) 100 UNIT/ML SOPN Inject 70 Units into the skin at bedtime.   Marland Kitchen ketoconazole (NIZORAL) 2 % cream Apply 1 application topically 2 (two) times daily.  Marland Kitchen linaclotide (LINZESS) 145 MCG CAPS capsule Take 145 mcg by mouth daily before breakfast.  . metFORMIN (GLUCOPHAGE) 1000 MG tablet Take 500 mg by mouth 2 (two) times daily with a meal.  . Multiple Vitamin (MULTIVITAMIN) tablet Take 1 tablet by mouth daily.  Marland Kitchen NEEDLE, REUSABLE, 22 G 22G X 1-1/2" MISC 1 Units by Does not apply route as directed. For B12 IM inj  . omega-3 acid ethyl esters (LOVAZA) 1 g  capsule Take 2 g by mouth 2 (two) times daily.  Marland Kitchen  ondansetron (ZOFRAN) 4 MG tablet Take 4 mg by mouth every 6 (six) hours as needed for nausea or vomiting.  Marland Kitchen oxyCODONE-acetaminophen (PERCOCET/ROXICET) 5-325 MG tablet Take 1 tablet by mouth every 8 (eight) hours as needed. (Patient taking differently: Take 1 tablet by mouth 3 (three) times daily. )  . pantoprazole (PROTONIX) 40 MG tablet Take 40 mg by mouth daily.  . Polyvinyl Alcohol-Povidone PF (REFRESH) 1.4-0.6 % SOLN Place 1 drop into both eyes daily.  . pregabalin (LYRICA) 100 MG capsule Take 1 capsule (100 mg total) by mouth 3 (three) times daily.  Marland Kitchen rOPINIRole (REQUIP) 2 MG tablet Take 2 mg by mouth at bedtime.   . Syringe/Needle, Disp, (SYRINGE 3CC/22GX1-1/2") 22G X 1-1/2" 3 ML MISC 1 Syringe by Does not apply route as directed. For b12 IM inj  . tamsulosin (FLOMAX) 0.4 MG CAPS capsule Take 0.4 mg by mouth daily.  Marland Kitchen torsemide (DEMADEX) 20 MG tablet Take 20 mg by mouth daily. May take an additional tablet for increased swelling.     Allergies:    Contrast media [iodinated diagnostic agents], Ivp dye [iodinated diagnostic agents], and Adhesive [tape]   Social History: Social History   Socioeconomic History  . Marital status: Married    Spouse name: Not on file  . Number of children: 3  . Years of education: Not on file  . Highest education level: Not on file  Occupational History  . Occupation: Magazine features editor: UNEMPLOYED  Tobacco Use  . Smoking status: Current Every Day Smoker    Packs/day: 2.00    Years: 35.00    Pack years: 70.00    Types: Cigarettes, E-cigarettes    Last attempt to quit: 10/28/2011    Years since quitting: 7.8  . Smokeless tobacco: Never Used  Substance and Sexual Activity  . Alcohol use: Not Currently    Comment: "not in a long time"  . Drug use: Not Currently  . Sexual activity: Yes    Birth control/protection: Implant  Other Topics Concern  . Not on file  Social History Narrative   **  Merged History Encounter **       HEAVY SMOKER AND CONTINUES TO SMOKE 1 PPD. DOES NOT EXERCISE REGULARLY.  Wife [phone] Lebron Quam). Has 2 sons and daughter. Still active       Social Determinants of Health   Financial Resource Strain:   . Difficulty of Paying Living Expenses: Not on file  Food Insecurity:   . Worried About Charity fundraiser in the Last Year: Not on file  . Ran Out of Food in the Last Year: Not on file  Transportation Needs:   . Lack of Transportation (Medical): Not on file  . Lack of Transportation (Non-Medical): Not on file  Physical Activity:   . Days of Exercise per Week: Not on file  . Minutes of Exercise per Session: Not on file  Stress:   . Feeling of Stress : Not on file  Social Connections:   . Frequency of Communication with Friends and Family: Not on file  . Frequency of Social Gatherings with Friends and Family: Not on file  . Attends Religious Services: Not on file  . Active Member of Clubs or Organizations: Not on file  . Attends Archivist Meetings: Not on file  . Marital Status: Not on file     Family History: The patient's family history includes Arthritis in an other family member; Cancer in his father and sister; Colon  cancer in his brother; Diabetes in his brother and father; Heart attack in his father; Heart disease in his father; Heart disease (age of onset: 72) in his brother; Hyperlipidemia in his brother, father, and sister; Hypertension in his brother, father, sister, son, and another family member; Varicose Veins in his father.  ROS:   All other ROS reviewed and negative. Pertinent positives noted in the HPI.     EKGs/Labs/Other Studies Reviewed:   The following studies were personally reviewed by me today:  EKG:  EKG is ordered today.  The ekg ordered today demonstrates NSR, 71 bpm RBBB; LAFB, and was personally reviewed by me.   TTE 09/17/2018 - Left ventricle: The cavity size was normal. There was moderate    concentric hypertrophy. Systolic function was normal. The   estimated ejection fraction was in the range of 55% to 60%.   Features are consistent with a pseudonormal left ventricular   filling pattern, with concomitant abnormal relaxation and   increased filling pressure (grade 2 diastolic dysfunction).   Doppler parameters are consistent with both elevated ventricular   end-diastolic filling pressure and elevated left atrial filling   pressure. - Mitral valve: Calcified annulus. - Left atrium: The atrium was mildly dilated. - Inferior vena cava: The vessel was dilated. The respirophasic   diameter changes was normal, may suggest elevated central venous   pressure.  LHC 09/16/2018 LM: Normal LCx: Normal LAD: High Diag 1 40-50% disease LCx: Normal RCA: Patent prox to distal RCA stents. Mild 20% prox late lumen loss LVEDP 32 mmHg  Carotid US 05/2019 Minimal stenosis in bilateral ICA of 1-15%. Heterogeneous plaque. Stenosis in the right external carotid artery (<50%). Left carotid endarterectomy site is patent.  Right vertebral artery flow is not well visualized. Antegrade left vertebral artery flow.   Recent Labs: 09/16/2018: B Natriuretic Peptide 265.2 06/19/2019: ALT 10; BUN 17; Creatinine, Ser 2.91; Platelets 200; Potassium 3.2; Sodium 134 09/02/2019: Hemoglobin 8.5   Recent Lipid Panel No results found for: CHOL, TRIG, HDL, CHOLHDL, VLDL, LDLCALC, LDLDIRECT  Physical Exam:   VS:  BP (!) 149/70   Pulse 71   Ht 6\' 2"  (1.88 m)   Wt 283 lb 3.2 oz (128.5 kg)   SpO2 98%   BMI 36.36 kg/m    Wt Readings from Last 3 Encounters:  09/15/19 283 lb 3.2 oz (128.5 kg)  06/19/19 274 lb (124.3 kg)  06/10/19 270 lb (122.5 kg)    General: Well nourished, well developed, in no acute distress Heart: Atraumatic, normal size  Eyes: PEERLA, EOMI  Neck: Supple, no JVD Endocrine: No thryomegaly Cardiac: Normal S1, S2; RRR; no murmurs, rubs, or gallops Lungs: Diminished breath sounds  bilaterally Abd: Soft, nontender, no hepatomegaly  Ext: Status post right BKA, edematous left lower extremity Musculoskeletal: No deformities, BUE and BLE strength normal and equal Skin: Warm and dry, no rashes   Neuro: Alert and oriented to person, place, time, and situation, CNII-XII grossly intact, no focal deficits  Psych: Normal mood and affect   ASSESSMENT:   DAVION FLANNERY is a 64 y.o. male who presents for the following: 1. Syncope and collapse   2. Trifascicular block   3. Coronary artery disease involving native coronary artery of native heart without angina pectoris   4. Peripheral artery disease (HCC)   5. Carotid stenosis, asymptomatic, bilateral     PLAN:   1. Syncope and collapse 2. Trifascicular block -His symptoms of weakness and fatigue occur while sitting.  They do  occur when he gets up to walk.  Recent heart cath with patent RCA stents and stable disease.  Recent echocardiogram with structurally normal heart.  No evidence of volume overload on examination.  He has had a longstanding history of trifascicular block.  I think it is worthwhile to pursue a 7-day ZIO patch to make sure he is not have any significant bradycardia episodes. -I highly suspect this is likely related to bouts of low blood sugar.  His most recent A1c is 6.9.  This is a bit low for him.  Given his significant cardiovascular disease and CKD I do wonder if worsening kidney disease is resulting in his insulin hanging around longer.  This will result in episodes of relative hypoglycemia for him.  I have asked him to reduce his nighttime insulin dose to 60 units daily and to discuss this further with his primary care physician. -He is on a beta-blocker and given his trifascicular block this may not be the best for him.  I will have him follow-up with Dr. Christen Butter for further management of this.  3. Coronary artery disease involving native coronary artery of native heart without angina pectoris -Recent heart  catheterization in January 2020 with patent stents -Continue aspirin statin as recommended by [name]  4. Peripheral artery disease (HCC) -Status post right BKA and left toe amputations -Followed by [name]  5. Carotid stenosis, asymptomatic, bilateral -Status post left CEA -Stable vascular ultrasound follow-up after [place]  Disposition: Return in about 2 months (around 11/13/2019).  Medication Adjustments/Labs and Tests Ordered: Current medicines are reviewed at length with the patient today.  Concerns regarding medicines are outlined above.  Orders Placed This Encounter  Procedures  . LONG TERM MONITOR (3-14 DAYS)  . EKG 12-Lead   No orders of the defined types were placed in this encounter.   Patient Instructions  Medication Instructions:  Your physician recommends that you continue on your current medications as directed. Please refer to the Current Medication list given to you today.  *If you need a refill on your cardiac medications before your next appointment, please call your pharmacy*  Lab Work: none If you have labs (blood work) drawn today and your tests are completely normal, you will receive your results only by: Marland Kitchen MyChart Message (if you have MyChart) OR . A paper copy in the mail If you have any lab test that is abnormal or we need to change your treatment, we will call you to review the results.  Testing/Procedures: Your physician has recommended that you wear a 7 DAY ZIO-PATCH monitor. The Zio patch cardiac monitor continuously records heart rhythm data for up to 14 days, this is for patients being evaluated for multiple types heart rhythms. For the first 24 hours post application, please avoid getting the Zio monitor wet in the shower or by excessive sweating during exercise. After that, feel free to carry on with regular activities. Keep soaps and lotions away from the ZIO XT Patch.  This will be mailed to you.     ZIO XT- Long Term Monitor Instructions     Your physician has requested you wear your ZIO patch monitor 7 days.   This is a single patch monitor.  [employer] supplies one patch monitor per enrollment.  Additional stickers are not available.   Please do not apply patch if you will be having a Nuclear Stress Test, Echocardiogram, Cardiac CT, MRI, or Chest Xray during the time frame you would be wearing the monitor.  The patch cannot be worn during these tests.  You cannot remove and re-apply the ZIO XT patch monitor.   Your ZIO patch monitor will be sent USPS Priority mail from [facility] directly to your home address. The monitor may also be mailed to a PO BOX if home delivery is not available.   It may take 3-5 days to receive your monitor after you have been enrolled.   Once you have received you monitor, please review enclosed instructions.  Your monitor has already been registered assigning a specific monitor serial # to you.   Applying the monitor   Shave hair from upper left chest.   Hold abrader disc by orange tab.  Rub abrader in 40 strokes over left upper chest as indicated in your monitor instructions.   Clean area with 4 enclosed alcohol pads .  Use all pads to assure are is cleaned thoroughly.  Let dry.   Apply patch as indicated in monitor instructions.  Patch will be place under collarbone on left side of chest with arrow pointing upward.   Rub patch adhesive wings for 2 minutes.Remove white label marked "1".  Remove white label marked "2".  Rub patch adhesive wings for 2 additional minutes.   While looking in a mirror, press and release button in center of patch.  A small green light will flash 3-4 times .  This will be your only indicator the monitor has been turned on.     Do not shower for the first 24 hours.  You may shower after the first 24 hours.   Press button if you feel a symptom. You will hear a small click.  Record Date, Time and Symptom in the Patient Log Book.   When you are ready to remove  patch, follow instructions on last 2 pages of Patient Log Book.  Stick patch monitor onto last page of Patient Log Book.   Place Patient Log Book in [place] box.  Use locking tab on box and tape box closed securely.  The [employer] has [employer] on it.  Please place in mailbox as soon as possible.  Your physician should have your test results approximately 7 days after the monitor has been mailed back to [facility].   Call [facility] at [phone] if you have questions regarding your ZIO XT patch monitor.  Call them immediately if you see an orange light blinking on your monitor.   If your monitor falls off in less than 4 days contact our Monitor department at [phone].  If your monitor becomes loose or falls off after 4 days call [employer] at [phone] for suggestions on securing your monitor.    Follow-Up: At [facility], you and your health needs are our priority.  As part of our continuing mission to provide you with exceptional heart care, we have created designated Provider Care Teams.  These Care Teams include your primary Cardiologist (physician) and Advanced Practice Providers (APPs -  Physician Assistants and Nurse Practitioners) who all work together to provide you with the care you need, when you need it.  Your next appointment:   2 month(s)  The format for your next appointment:   Virtual Visit   Provider:   Eleonore Chiquito, MD  Other Instructions PER DR. O'NEAL, PLEASE DISCUSS REDUCING YOUR INSULIN DOSE WITH YOUR DOCTOR/PCP     Signed, [place] T. Audie Box, [place]  [address], [place], [place] [address] [phone]  09/15/2019 5:39 PM

## 2019-09-15 ENCOUNTER — Other Ambulatory Visit: Payer: Self-pay

## 2019-09-15 ENCOUNTER — Telehealth: Payer: Self-pay

## 2019-09-15 ENCOUNTER — Encounter: Payer: Self-pay | Admitting: Cardiovascular Disease

## 2019-09-15 ENCOUNTER — Encounter: Payer: Self-pay | Admitting: *Deleted

## 2019-09-15 ENCOUNTER — Ambulatory Visit (INDEPENDENT_AMBULATORY_CARE_PROVIDER_SITE_OTHER): Payer: 59 | Admitting: Cardiovascular Disease

## 2019-09-15 VITALS — BP 149/70 | HR 71 | Ht 74.0 in | Wt 283.2 lb

## 2019-09-15 DIAGNOSIS — I453 Trifascicular block: Secondary | ICD-10-CM

## 2019-09-15 DIAGNOSIS — I739 Peripheral vascular disease, unspecified: Secondary | ICD-10-CM

## 2019-09-15 DIAGNOSIS — I6523 Occlusion and stenosis of bilateral carotid arteries: Secondary | ICD-10-CM

## 2019-09-15 DIAGNOSIS — I2510 Atherosclerotic heart disease of native coronary artery without angina pectoris: Secondary | ICD-10-CM

## 2019-09-15 DIAGNOSIS — R55 Syncope and collapse: Secondary | ICD-10-CM

## 2019-09-15 NOTE — Progress Notes (Signed)
Patient ID: [name], male   DOB: [birth year], 64 y.o.   MRN: [id number] Patient enrolled for 7 day ZIO XT to be mailed to his home.

## 2019-09-15 NOTE — Telephone Encounter (Signed)
NOTES FAXED TO [facility] °

## 2019-09-15 NOTE — Patient Instructions (Addendum)
Medication Instructions:  Your physician recommends that you continue on your current medications as directed. Please refer to the Current Medication list given to you today.  *If you need a refill on your cardiac medications before your next appointment, please call your pharmacy*  Lab Work: none If you have labs (blood work) drawn today and your tests are completely normal, you will receive your results only by: Marland Kitchen MyChart Message (if you have MyChart) OR . A paper copy in the mail If you have any lab test that is abnormal or we need to change your treatment, we will call you to review the results.  Testing/Procedures: Your physician has recommended that you wear a 7 DAY ZIO-PATCH monitor. The Zio patch cardiac monitor continuously records heart rhythm data for up to 14 days, this is for patients being evaluated for multiple types heart rhythms. For the first 24 hours post application, please avoid getting the Zio monitor wet in the shower or by excessive sweating during exercise. After that, feel free to carry on with regular activities. Keep soaps and lotions away from the ZIO XT Patch.  This will be mailed to you.     ZIO XT- Long Term Monitor Instructions   Your physician has requested you wear your ZIO patch monitor 7 days.   This is a single patch monitor.  [employer] supplies one patch monitor per enrollment.  Additional stickers are not available.   Please do not apply patch if you will be having a Nuclear Stress Test, Echocardiogram, Cardiac CT, MRI, or Chest Xray during the time frame you would be wearing the monitor. The patch cannot be worn during these tests.  You cannot remove and re-apply the ZIO XT patch monitor.   Your ZIO patch monitor will be sent USPS Priority mail from [facility] directly to your home address. The monitor may also be mailed to a PO BOX if home delivery is not available.   It may take 3-5 days to receive your monitor after you have been  enrolled.   Once you have received you monitor, please review enclosed instructions.  Your monitor has already been registered assigning a specific monitor serial # to you.   Applying the monitor   Shave hair from upper left chest.   Hold abrader disc by orange tab.  Rub abrader in 40 strokes over left upper chest as indicated in your monitor instructions.   Clean area with 4 enclosed alcohol pads .  Use all pads to assure are is cleaned thoroughly.  Let dry.   Apply patch as indicated in monitor instructions.  Patch will be place under collarbone on left side of chest with arrow pointing upward.   Rub patch adhesive wings for 2 minutes.Remove white label marked "1".  Remove white label marked "2".  Rub patch adhesive wings for 2 additional minutes.   While looking in a mirror, press and release button in center of patch.  A small green light will flash 3-4 times .  This will be your only indicator the monitor has been turned on.     Do not shower for the first 24 hours.  You may shower after the first 24 hours.   Press button if you feel a symptom. You will hear a small click.  Record Date, Time and Symptom in the Patient Log Book.   When you are ready to remove patch, follow instructions on last 2 pages of Patient Log Book.  Stick patch monitor onto last page  of Patient Log Book.   Place Patient Log Book in [place] box.  Use locking tab on box and tape box closed securely.  The [employer] has [employer] on it.  Please place in mailbox as soon as possible.  Your physician should have your test results approximately 7 days after the monitor has been mailed back to [facility].   Call [place] at [phone] if you have questions regarding your ZIO XT patch monitor.  Call them immediately if you see an orange light blinking on your monitor.   If your monitor falls off in less than 4 days contact our Monitor department at [phone].  If your monitor  becomes loose or falls off after 4 days call [employer] at [phone] for suggestions on securing your monitor.    Follow-Up: At [facility], you and your health needs are our priority.  As part of our continuing mission to provide you with exceptional heart care, we have created designated Provider Care Teams.  These Care Teams include your primary Cardiologist (physician) and Advanced Practice Providers (APPs -  Physician Assistants and Nurse Practitioners) who all work together to provide you with the care you need, when you need it.  Your next appointment:   2 month(s)  The format for your next appointment:   Virtual Visit   Provider:   Eleonore Chiquito, MD  Other Instructions PER DR. O'NEAL, PLEASE DISCUSS REDUCING YOUR INSULIN DOSE WITH YOUR DOCTOR/PCP

## 2019-09-16 ENCOUNTER — Encounter: Payer: Self-pay | Admitting: Family

## 2019-09-16 ENCOUNTER — Ambulatory Visit (INDEPENDENT_AMBULATORY_CARE_PROVIDER_SITE_OTHER): Payer: 59 | Admitting: Family

## 2019-09-16 VITALS — Ht 74.0 in | Wt 283.0 lb

## 2019-09-16 DIAGNOSIS — I6523 Occlusion and stenosis of bilateral carotid arteries: Secondary | ICD-10-CM

## 2019-09-16 DIAGNOSIS — Z89511 Acquired absence of right leg below knee: Secondary | ICD-10-CM

## 2019-09-16 NOTE — Progress Notes (Signed)
Office Visit Note   Patient: [name]           Date of Birth: 03-29-1956           MRN: [id number] Visit Date: 09/16/2019              Requested by: [name], PA-C [address] [place],  [place] [address] PCP: [name], PA-C  Chief Complaint  Patient presents with  . Right Leg - Follow-up    BKA 2016  . Left Foot - Follow-up    2017 5th ray amputation       HPI: This is a pleasant 64 year old gentleman who is 4-year status post right below-knee amputation and almost 4 years status post left fifth ray amputation.  On the right side his current prosthetic is cracked in several places and can rub on his skin he also is using 3 5 ply socks and 1 4 ply sock and the prosthetic still feels unstable to him he also utilizes diabetic shoes and an upper rate brace on the other side left side he has used this for stability since having a ray amputation and revision almost 4 years ago the shoes have worn through the rubber in the brace attached to the left shoe is bent  Assessment & Plan: Visit Diagnoses: No diagnosis found.  Plan: I have provided him with a new prescription for a prosthetic and supplies this is medically necessary to prevent breakdown of his amputation stump and prevent instability when he walks I have also written a prescription for diabetic shoes and the double upright brace for the left.  Follow-Up Instructions: No follow-ups on file.   Ortho Exam  Patient is alert, oriented, no adenopathy, well-dressed, normal affect, normal respiratory effort. Amputation stump is without any ulcerations.  The socket is cracked both in the back and in the front with gapping.  Both of his shoes are worn through to the innersole.  The brace straps are no longer functioning and the brace is bent on the left  Imaging: No results found. No images are attached to the encounter.  Labs: Lab Results  Component Value Date   HGBA1C 5.7 (H) 09/16/2018   HGBA1C 7.7 (H)  05/15/2018   HGBA1C 7.1 (H) 01/07/2018   ESRSEDRATE 35 (H) 09/03/2016   ESRSEDRATE 111 (H) 05/11/2016   ESRSEDRATE 98 (H) 05/04/2016   CRP 1.2 (H) 09/03/2016   CRP 4.7 (H) 05/11/2016   CRP 8.0 (H) 05/04/2016   REPTSTATUS 11/15/2017 FINAL 11/10/2017   GRAMSTAIN  11/04/2017    ABUNDANT WBC PRESENT, PREDOMINANTLY PMN MODERATE GRAM NEGATIVE RODS MODERATE GRAM POSITIVE COCCI Performed at [facility], [place] [address] [place], [place] [address]    CULT  11/10/2017    NO GROWTH 5 DAYS Performed at [facility] [address] [place], [place] [address]    LABORGA STAPHYLOCOCCUS AUREUS 09/26/2014     Lab Results  Component Value Date   ALBUMIN 2.9 (L) 06/19/2019   ALBUMIN 3.5 05/15/2018   ALBUMIN 3.4 (L) 01/07/2018   PREALBUMIN 19.0 05/04/2016   PREALBUMIN 25.0 12/16/2015    Lab Results  Component Value Date   MG 1.9 05/24/2018   MG 2.1 05/23/2018   MG 1.3 (L) 05/22/2018   No results found for: VD25OH  Lab Results  Component Value Date   PREALBUMIN 19.0 05/04/2016   PREALBUMIN 25.0 12/16/2015   CBC EXTENDED Latest Ref Rng & Units 09/02/2019 08/05/2019 07/09/2019  WBC 4.0 -  10.5 K/uL - - -  RBC 4.22 - 5.81 MIL/uL - - -  HGB 13.0 - 17.0 g/dL 8.5(L) 9.4(L) 10.1(L)  HCT 39.0 - 52.0 % - - -  PLT 150 - 400 K/uL - - -  NEUTROABS 1.7 - 7.7 K/uL - - -  LYMPHSABS 0.7 - 4.0 K/uL - - -     Body mass index is 36.34 kg/m.  Orders:  No orders of the defined types were placed in this encounter.  No orders of the defined types were placed in this encounter.    Procedures: No procedures performed  Clinical Data: No additional findings.  ROS:  All other systems negative, except as noted in the HPI. Review of Systems  Objective: Vital Signs: Ht 6\' 2"  (1.88 m)   Wt 283 lb (128.4 kg)   BMI 36.34 kg/m   Specialty Comments:  No specialty comments available.  PMFS History: Patient Active Problem List   Diagnosis Date Noted  . 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 Pseudoclaudication 11/15/2018  . Essential hypertension 09/18/2018  . S/P BKA (below knee amputation), right ([place]) 09/18/2018  . Amputation of toe of left foot ([place]) 09/18/2018  . Peripheral artery disease ([place]) 09/18/2018  . S/P carotid endarterectomy 09/18/2018  . OSA on CPAP 09/18/2018  . Chronic back pain 09/18/2018  . Status post reversal of ileostomy 05/21/2018  . Normocytic anemia 02/14/2018  . Intra-abdominal abscess ([facility]) 11/04/2017  . Chronic venous hypertension (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 ([place]) 12/11/2016  . Unilateral primary osteoarthritis, left knee 10/11/2016  . History of right below knee amputation ([place]) 07/12/2016  . Diabetic foot infection ([place]) 05/04/2016  . Type 2 diabetes mellitus with insulin therapy ([place]) 12/16/2015  . Amputated toe ([place]) 10/21/2015  . Leg edema, left 09/03/2015  . CAD (dz of distal, mid and proximal RCA with implantation of 3 overlapping drug-eluting stent,) 09/03/2015  . Chronic diastolic heart failure, NYHA class 2 ([place]) 09/03/2015  . Angina pectoris associated with type 2 diabetes mellitus ([place]) 10/28/2014  . Hyperlipidemia 08/25/2014  . Chronic total occlusion of artery of the extremities ([place]) 04/08/2012  . Onychomycosis 02/01/2012  . Occlusion and stenosis of carotid artery without mention of cerebral infarction 06/12/2011  . GERD (gastroesophageal reflux disease) 05/08/2011  . Barrett's esophagus without dysplasia 05/08/2011  . Former tobacco use 02/01/2011   Past Medical History:  Diagnosis Date  . Carotid artery occlusion 11/10/10   LEFT CAROTID ENDARTERECTOMY  . Complication of anesthesia    BP WENT UP AT [facility] "  . COPD (chronic obstructive pulmonary disease) ([place])   . Diverticulitis   . Diverticulosis of colon (without mention of hemorrhage)   . DJD (degenerative joint disease)   . Fatty liver   . Full dentures   . GERD (gastroesophageal reflux disease)   . H/O hiatal  hernia   . Hyperlipidemia   . Hypertension   . Neuromuscular disorder ([place])    peripheral neuropathy  . Non-pressure chronic ulcer of other part of left foot limited to breakdown of skin ([place]) 11/12/2016  . Osteomyelitis ([place])    left 5th metatarsal  . PAD (peripheral artery disease) ([place])    Distal aortogram June 2012. Atherectomy left popliteal artery July 2012.   . Pseudoclaudication 11/15/2018  . Slurred speech    AS PER WIFE IN D/C NOTE 11/10/10  . 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 Unstable angina ([place]) 09/16/2018  . Wears glasses     Family History  Problem Relation  Age of Onset  . Heart disease Father        Before age 28-  CAD, BPG  . Diabetes Father        Amputation  . Cancer Father        PROSTATE  . Hyperlipidemia Father   . Hypertension Father   . Heart attack Father        Triple BPG  . Varicose Veins Father   . Colon cancer Brother   . Diabetes Brother   . Heart disease Brother 68       A-Fib. Before age 67  . Hyperlipidemia Brother   . Hypertension Brother   . Cancer Sister        Breast  . Hyperlipidemia Sister   . Hypertension Sister   . Hypertension Son   . Arthritis Other        GRANDMOTHER  . Hypertension Other        OTHER FAMILY MEMBERS    Past Surgical History:  Procedure Laterality Date  . AMPUTATION  11/05/2011   Procedure: AMPUTATION RAY;  Surgeon: Wylene Simmer, MD;  Location: [facility];  Service: Orthopedics;  Laterality: Right;  Amputation of Right 4&5th Toes  . AMPUTATION Left 11/26/2012   Procedure: AMPUTATION RAY;  Surgeon: Wylene Simmer, MD;  Location: [facility];  Service: Orthopedics;  Laterality: Left;  fourth ray amputation  . AMPUTATION Right 08/27/2014   Procedure: Transmetatarsal Amputation;  Surgeon: Newt Minion, MD;  Location: [facility];  Service: Orthopedics;  Laterality: Right;  . AMPUTATION Right 01/14/2015   Procedure: AMPUTATION BELOW KNEE;  Surgeon: Newt Minion, MD;  Location: [facility];  Service: Orthopedics;  Laterality: Right;  . AMPUTATION  Left 10/21/2015   Procedure: Left Foot 5th Ray Amputation;  Surgeon: Newt Minion, MD;  Location: [facility];  Service: Orthopedics;  Laterality: Left;  . ANTERIOR FUSION CERVICAL SPINE  02/06/06   C4-5, C5-6, C6-7; SURGEON DR. MAX COHEN  . BACK SURGERY    . BELOW KNEE LEG AMPUTATION Right   . CARDIAC CATHETERIZATION  10/31/04   2009  . CAROTID ENDARTERECTOMY  11/10/10  . CAROTID ENDARTERECTOMY Left 11/10/2010   Subtotal occlusion of left internal carotid artery with left hemispheric transient ischemic attacks.  . CAROTID STENT    . CARPAL TUNNEL RELEASE Right 10/21/2013   Procedure: RIGHT CARPAL TUNNEL RELEASE;  Surgeon: Wynonia Sours, MD;  Location: [facility];  Service: Orthopedics;  Laterality: Right;  . CHOLECYSTECTOMY    . COLON SURGERY    . COLONOSCOPY    . COLOSTOMY REVERSAL  05/21/2018   ileostomy reversal  . CYSTOSCOPY WITH STENT PLACEMENT Bilateral 01/13/2018   Procedure: CYSTOSCOPY WITH BILATERAL URETERAL CATHETER PLACEMENT;  Surgeon: Ardis Hughs, MD;  Location: [facility];  Service: Urology;  Laterality: Bilateral;  . ESOPHAGEAL MANOMETRY Bilateral 07/19/2014   Procedure: ESOPHAGEAL MANOMETRY (EM);  Surgeon: Jerene Bears, MD;  [facility];  Service: Gastroenterology;  Laterality: Bilateral;  . FEMORAL ARTERY STENT     x6  . FINGER SURGERY    . FOOT SURGERY  04/25/2016    EXCISION BASE 5TH METATARSAL AND PARTIAL CUBOID LEFT FOOT  . HERNIA REPAIR     LEFT INGUINAL AND UMBILICAL REPAIRS  . HERNIA REPAIR    . I & D EXTREMITY Left 04/25/2016   Procedure: EXCISION BASE 5TH METATARSAL AND PARTIAL CUBOID LEFT FOOT;  Surgeon: Newt Minion, MD;  Location: [facility];  Service: Orthopedics;  Laterality: Left;  .  ILEOSTOMY  01/13/2018   Procedure: ILEOSTOMY;  Surgeon: Clovis Riley, MD;  Location[facility];  Service: General;;  . ILEOSTOMY CLOSURE N/A 05/21/2018   Procedure: ILEOSTOMY REVERSAL ERAS PATHWAY;  Surgeon: Clovis Riley, MD;  Location: [facility];  Service:  General;  Laterality: N/A;  . IR RADIOLOGIST EVAL & MGMT  11/19/2017  . IR RADIOLOGIST EVAL & MGMT  12/03/2017  . IR RADIOLOGIST EVAL & MGMT  12/18/2017  . JOINT REPLACEMENT Right 2001   Total  . LAMINECTOMY     X 3 LUMBAR AND X 2 CERVICAL SPINE OPERATIONS  . LAPAROSCOPIC CHOLECYSTECTOMY W/ CHOLANGIOGRAPHY  11/09/04   SURGEON DR. Luella Cook  . LEFT HEART CATH AND CORONARY ANGIOGRAPHY N/A 09/16/2018   Procedure: LEFT HEART CATH AND CORONARY ANGIOGRAPHY;  Surgeon: Nigel Mormon, MD;  Location: [facility];  Service: Cardiovascular;  Laterality: N/A;  . LEFT HEART CATHETERIZATION WITH CORONARY ANGIOGRAM N/A 10/29/2014   Procedure: LEFT HEART CATHETERIZATION WITH CORONARY ANGIOGRAM;  Surgeon: Laverda Page, MD;  Location: [facility] C[facility] LAB;  Service: Cardiovascular;  Laterality: N/A;  . LOWER EXTREMITY ANGIOGRAM N/A 03/19/2012   Procedure: LOWER EXTREMITY ANGIOGRAM;  Surgeon: Burnell Blanks, MD;  Location: [facility] LAB;  Service: Cardiovascular;  Laterality: N/A;  . NECK SURGERY    . PARTIAL COLECTOMY N/A 01/13/2018   Procedure: LAPAROSCOPIC ASSISTED   SIGMOID COLECTOMY ILEOSTOMY;  Surgeon: Clovis Riley, MD;  Location[facility];  Service: General;  Laterality: N/A;  . PENILE PROSTHESIS IMPLANT  08/14/05   INFRAPUBIC INSERTION OF INFLATABLE PENILE PROSTHESIS; SURGEON DR. Amalia Hailey  . PENILE PROSTHESIS IMPLANT    . PERCUTANEOUS CORONARY STENT INTERVENTION (PCI-S) Right 10/29/2014   Procedure: PERCUTANEOUS CORONARY STENT INTERVENTION (PCI-S);  Surgeon: Laverda Page, MD;  Location: [facility] CATH LAB;  Service: Cardiovascular;  Laterality: Right;  . SHOULDER ARTHROSCOPY    . SPINE SURGERY    . TOE AMPUTATION Left   . TONSILLECTOMY    . TOTAL KNEE ARTHROPLASTY  07/2002   RIGHT KNEE ; SURGEON  DR. [name] ALSO HAD ARTHROSCOPIC RIGHT KNEE IN  10/2001  . TOTAL KNEE ARTHROPLASTY    . ULNAR NERVE TRANSPOSITION Right 10/21/2013   Procedure: RIGHT ELBOW  ULNAR NERVE DECOMPRESSION;  Surgeon: Wynonia Sours, MD;  Location: [facility];  Service: Orthopedics;  Laterality: Right;   Social History   Occupational History  . Occupation: Magazine features editor: UNEMPLOYED  Tobacco Use  . Smoking status: Current Every Day Smoker    Packs/day: 2.00    Years: 35.00    Pack years: 70.00    Types: Cigarettes, E-cigarettes    Last attempt to quit: 10/28/2011    Years since quitting: 7.8  . Smokeless tobacco: Never Used  Substance and Sexual Activity  . Alcohol use: Not Currently    Comment: "not in a long time"  . Drug use: Not Currently  . Sexual activity: Yes    Birth control/protection: Implant

## 2019-09-19 ENCOUNTER — Ambulatory Visit (INDEPENDENT_AMBULATORY_CARE_PROVIDER_SITE_OTHER): Payer: 59

## 2019-09-19 DIAGNOSIS — R55 Syncope and collapse: Secondary | ICD-10-CM | POA: Diagnosis not present

## 2019-09-30 ENCOUNTER — Ambulatory Visit (HOSPITAL_COMMUNITY)
Admission: RE | Admit: 2019-09-30 | Discharge: 2019-09-30 | Disposition: A | Discharge status: Home / Self Care | Payer: 59 | Source: Ambulatory Visit | Attending: Nephrology | Admitting: Nephrology

## 2019-09-30 ENCOUNTER — Other Ambulatory Visit: Payer: Self-pay

## 2019-09-30 DIAGNOSIS — D631 Anemia in chronic kidney disease: Secondary | ICD-10-CM | POA: Insufficient documentation

## 2019-09-30 DIAGNOSIS — N183 Chronic kidney disease, stage 3 unspecified: Secondary | ICD-10-CM | POA: Diagnosis present

## 2019-09-30 LAB — IRON AND TIBC
Iron: 34 ug/dL — ABNORMAL LOW (ref 45–182)
Saturation Ratios: 13 % — ABNORMAL LOW (ref 17.9–39.5)
TIBC: 255 ug/dL (ref 250–450)
UIBC: 221 ug/dL

## 2019-09-30 LAB — FERRITIN: Ferritin: 206 ng/mL (ref 24–336)

## 2019-09-30 LAB — POCT HEMOGLOBIN-HEMACUE: Hemoglobin: 9.1 g/dL — ABNORMAL LOW (ref 13.0–17.0)

## 2019-09-30 MED ORDER — EPOETIN ALFA-EPBX 10000 UNIT/ML IJ SOLN
20000.0000 [IU] | Freq: Once | INTRAMUSCULAR | Status: DC
  Administered 2019-09-30: 20000 [IU] via SUBCUTANEOUS

## 2019-09-30 MED ORDER — EPOETIN ALFA-EPBX 10000 UNIT/ML IJ SOLN
INTRAMUSCULAR | Status: AC
  Filled 2019-09-30: qty 2

## 2019-10-12 ENCOUNTER — Encounter (HOSPITAL_COMMUNITY): Payer: 59

## 2019-10-14 ENCOUNTER — Encounter (HOSPITAL_COMMUNITY): Payer: 59

## 2019-10-14 ENCOUNTER — Other Ambulatory Visit (HOSPITAL_COMMUNITY): Payer: Self-pay | Admitting: *Deleted

## 2019-10-15 ENCOUNTER — Other Ambulatory Visit: Payer: Self-pay

## 2019-10-15 ENCOUNTER — Ambulatory Visit (HOSPITAL_COMMUNITY)
Admission: RE | Admit: 2019-10-15 | Discharge: 2019-10-15 | Disposition: A | Discharge status: Home / Self Care | Payer: 59 | Source: Ambulatory Visit | Attending: Nephrology | Admitting: Nephrology

## 2019-10-15 DIAGNOSIS — N183 Chronic kidney disease, stage 3 unspecified: Secondary | ICD-10-CM | POA: Diagnosis not present

## 2019-10-15 DIAGNOSIS — D631 Anemia in chronic kidney disease: Secondary | ICD-10-CM | POA: Insufficient documentation

## 2019-10-15 LAB — POCT HEMOGLOBIN-HEMACUE: Hemoglobin: 9.2 g/dL — ABNORMAL LOW (ref 13.0–17.0)

## 2019-10-15 MED ORDER — SODIUM CHLORIDE 0.9 % IV SOLN
510.0000 mg | INTRAVENOUS | Status: DC
  Administered 2019-10-15: 510 mg via INTRAVENOUS
  Filled 2019-10-15: qty 17

## 2019-10-15 MED ORDER — EPOETIN ALFA-EPBX 10000 UNIT/ML IJ SOLN
INTRAMUSCULAR | Status: AC
  Filled 2019-10-15: qty 2

## 2019-10-15 MED ORDER — EPOETIN ALFA-EPBX 10000 UNIT/ML IJ SOLN
20000.0000 [IU] | Freq: Once | INTRAMUSCULAR | Status: DC
  Administered 2019-10-15: 20000 [IU] via SUBCUTANEOUS

## 2019-10-19 ENCOUNTER — Encounter (HOSPITAL_COMMUNITY)
Admission: RE | Admit: 2019-10-19 | Discharge: 2019-10-19 | Disposition: A | Discharge status: Home / Self Care | Payer: 59 | Source: Ambulatory Visit | Attending: Nephrology | Admitting: Nephrology

## 2019-10-19 ENCOUNTER — Other Ambulatory Visit: Payer: Self-pay

## 2019-10-19 DIAGNOSIS — N183 Chronic kidney disease, stage 3 unspecified: Secondary | ICD-10-CM | POA: Insufficient documentation

## 2019-10-19 DIAGNOSIS — D631 Anemia in chronic kidney disease: Secondary | ICD-10-CM | POA: Diagnosis not present

## 2019-10-19 MED ORDER — SODIUM CHLORIDE 0.9 % IV SOLN
510.0000 mg | INTRAVENOUS | Status: DC
  Administered 2019-10-19: 510 mg via INTRAVENOUS
  Filled 2019-10-19: qty 17

## 2019-10-22 ENCOUNTER — Encounter (HOSPITAL_COMMUNITY): Payer: 59

## 2019-10-23 ENCOUNTER — Other Ambulatory Visit (HOSPITAL_COMMUNITY): Payer: Self-pay | Admitting: *Deleted

## 2019-10-26 ENCOUNTER — Encounter (HOSPITAL_COMMUNITY)
Admission: RE | Admit: 2019-10-26 | Discharge: 2019-10-26 | Disposition: A | Discharge status: Home / Self Care | Payer: 59 | Source: Ambulatory Visit | Attending: Nephrology | Admitting: Nephrology

## 2019-10-26 ENCOUNTER — Other Ambulatory Visit: Payer: Self-pay

## 2019-10-26 DIAGNOSIS — N183 Chronic kidney disease, stage 3 unspecified: Secondary | ICD-10-CM | POA: Diagnosis not present

## 2019-10-26 LAB — IRON AND TIBC
Iron: 66 ug/dL (ref 45–182)
Saturation Ratios: 28 % (ref 17.9–39.5)
TIBC: 239 ug/dL — ABNORMAL LOW (ref 250–450)
UIBC: 173 ug/dL

## 2019-10-26 LAB — FERRITIN: Ferritin: 344 ng/mL — ABNORMAL HIGH (ref 24–336)

## 2019-10-26 LAB — POCT HEMOGLOBIN-HEMACUE: Hemoglobin: 9.8 g/dL — ABNORMAL LOW (ref 13.0–17.0)

## 2019-10-26 MED ORDER — EPOETIN ALFA-EPBX 10000 UNIT/ML IJ SOLN: 20000.0000 [IU] | Freq: Once | INTRAMUSCULAR | Status: AC

## 2019-10-26 MED ORDER — EPOETIN ALFA-EPBX 10000 UNIT/ML IJ SOLN
INTRAMUSCULAR | Status: AC
  Administered 2019-10-26: 20000 [IU] via SUBCUTANEOUS
  Filled 2019-10-26: qty 2

## 2019-10-29 ENCOUNTER — Encounter (HOSPITAL_COMMUNITY): Payer: 59

## 2019-11-09 ENCOUNTER — Ambulatory Visit (HOSPITAL_COMMUNITY)
Admission: RE | Admit: 2019-11-09 | Discharge: 2019-11-09 | Disposition: A | Discharge status: Home / Self Care | Payer: 59 | Source: Ambulatory Visit | Attending: Nephrology | Admitting: Nephrology

## 2019-11-09 ENCOUNTER — Other Ambulatory Visit: Payer: Self-pay

## 2019-11-09 DIAGNOSIS — D631 Anemia in chronic kidney disease: Secondary | ICD-10-CM | POA: Insufficient documentation

## 2019-11-09 DIAGNOSIS — N183 Chronic kidney disease, stage 3 unspecified: Secondary | ICD-10-CM | POA: Insufficient documentation

## 2019-11-09 LAB — POCT HEMOGLOBIN-HEMACUE: Hemoglobin: 9.7 g/dL — ABNORMAL LOW (ref 13.0–17.0)

## 2019-11-09 MED ORDER — EPOETIN ALFA-EPBX 10000 UNIT/ML IJ SOLN
20000.0000 [IU] | INTRAMUSCULAR | Status: DC
  Administered 2019-11-09: 20000 [IU] via SUBCUTANEOUS

## 2019-11-20 ENCOUNTER — Other Ambulatory Visit (HOSPITAL_COMMUNITY): Payer: Self-pay | Admitting: *Deleted

## 2019-11-22 NOTE — Progress Notes (Deleted)
{Choose 1 Note Type (Video or Telephone):[phone]}   The patient was identified using 2 identifiers.  Date:  11/22/2019   ID:  [name], DOB 1955/12/23, MRN [id number]  {Patient Location:[phone]::"Home"} {Provider Location:([phone]::"Home"}  PCP:  [name], PA-C  Cardiologist:  No primary care provider on file. *** Electrophysiologist:  None   Evaluation Performed:  {Choose Visit Type:[phone]::"Follow-Up Visit"}  Chief Complaint:  ***  History of Present Illness:    [name] is a 64 y.o. male with CAD, trifascicular block, CKD IV, DM, PAD s/p R BKA  Problem List 1. CAD PCI pRCA/dRCA 2016 -LHC 09/16/2018 - > patent stent, normal LM, normal LCX, D1 50%, patent stents  2. PAD  -s/p R BKA -L toe amputation  3. Carotid artery disease -L CEA 2012 4. DM, A1c 6.9 5. CKD IV 6. Trifascicular Block   The patient {does/does not:200015} have symptoms concerning for COVID-19 infection (fever, chills, cough, or new shortness of breath).    Past Medical History:  Diagnosis Date  . Carotid artery occlusion 11/10/10   LEFT CAROTID ENDARTERECTOMY  . Complication of anesthesia    BP WENT UP AT [facility] "  . COPD (chronic obstructive pulmonary disease) ([place])   . Diverticulitis   . Diverticulosis of colon (without mention of hemorrhage)   . DJD (degenerative joint disease)   . Fatty liver   . Full dentures   . GERD (gastroesophageal reflux disease)   . H/O hiatal hernia   . Hyperlipidemia   . Hypertension   . Neuromuscular disorder ([place])    peripheral neuropathy  . Non-pressure chronic ulcer of other part of left foot limited to breakdown of skin ([place]) 11/12/2016  . Osteomyelitis ([place])    left 5th metatarsal  . PAD (peripheral artery disease) ([place])    Distal aortogram June 2012. Atherectomy left popliteal artery July 2012.   . Pseudoclaudication 11/15/2018  . Slurred speech    AS PER WIFE IN D/C NOTE 11/10/10  . 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 Unstable angina  ([place]) 09/16/2018  . Wears glasses    Past Surgical History:  Procedure Laterality Date  . AMPUTATION  11/05/2011   Procedure: AMPUTATION RAY;  Surgeon: Wylene Simmer, MD;  Location: [place];  Service: Orthopedics;  Laterality: Right;  Amputation of Right 4&5th Toes  . AMPUTATION Left 11/26/2012   Procedure: AMPUTATION RAY;  Surgeon: Wylene Simmer, MD;  Location: [place];  Service: Orthopedics;  Laterality: Left;  fourth ray amputation  . AMPUTATION Right 08/27/2014   Procedure: Transmetatarsal Amputation;  Surgeon: Newt Minion, MD;  Location: [place];  Service: Orthopedics;  Laterality: Right;  . AMPUTATION Right 01/14/2015   Procedure: AMPUTATION BELOW KNEE;  Surgeon: Newt Minion, MD;  Location: [place];  Service: Orthopedics;  Laterality: Right;  . AMPUTATION Left 10/21/2015   Procedure: Left Foot 5th Ray Amputation;  Surgeon: Newt Minion, MD;  Location: [place];  Service: Orthopedics;  Laterality: Left;  . ANTERIOR FUSION CERVICAL SPINE  02/06/06   C4-5, C5-6, C6-7; SURGEON DR. MAX COHEN  . BACK SURGERY    . BELOW KNEE LEG AMPUTATION Right   . CARDIAC CATHETERIZATION  10/31/04   2009  . CAROTID ENDARTERECTOMY  11/10/10  . CAROTID ENDARTERECTOMY Left 11/10/2010   Subtotal occlusion of left internal carotid artery with left hemispheric transient ischemic attacks.  . CAROTID STENT    . CARPAL TUNNEL RELEASE Right 10/21/2013   Procedure: RIGHT CARPAL TUNNEL RELEASE;  Surgeon: Wynonia Sours, MD;  Location: [place];  Service: Orthopedics;  Laterality: Right;  . CHOLECYSTECTOMY    . COLON SURGERY    . COLONOSCOPY    . COLOSTOMY REVERSAL  05/21/2018   ileostomy reversal  . CYSTOSCOPY WITH STENT PLACEMENT Bilateral 01/13/2018   Procedure: CYSTOSCOPY WITH BILATERAL URETERAL CATHETER PLACEMENT;  Surgeon: Ardis Hughs, MD;  Location: [facility];  Service: Urology;  Laterality: Bilateral;  . ESOPHAGEAL MANOMETRY Bilateral 07/19/2014   Procedure: ESOPHAGEAL MANOMETRY (EM);  Surgeon: Jerene Bears,  MD;  Location: [facility];  Service: Gastroenterology;  Laterality: Bilateral;  . FEMORAL ARTERY STENT     x6  . FINGER SURGERY    . FOOT SURGERY  04/25/2016    EXCISION BASE 5TH METATARSAL AND PARTIAL CUBOID LEFT FOOT  . HERNIA REPAIR     LEFT INGUINAL AND UMBILICAL REPAIRS  . HERNIA REPAIR    . I & D EXTREMITY Left 04/25/2016   Procedure: EXCISION BASE 5TH METATARSAL AND PARTIAL CUBOID LEFT FOOT;  Surgeon: Newt Minion, MD;  Location: [place];  Service: Orthopedics;  Laterality: Left;  . ILEOSTOMY  01/13/2018   Procedure: ILEOSTOMY;  Surgeon: Clovis Riley, MD;  Location: [facility];  Service: General;;  . ILEOSTOMY CLOSURE N/A 05/21/2018   Procedure: ILEOSTOMY REVERSAL ERAS PATHWAY;  Surgeon: Clovis Riley, MD;  Location: [place];  Service: General;  Laterality: N/A;  . IR RADIOLOGIST EVAL & MGMT  11/19/2017  . IR RADIOLOGIST EVAL & MGMT  12/03/2017  . IR RADIOLOGIST EVAL & MGMT  12/18/2017  . JOINT REPLACEMENT Right 2001   Total  . LAMINECTOMY     X 3 LUMBAR AND X 2 CERVICAL SPINE OPERATIONS  . LAPAROSCOPIC CHOLECYSTECTOMY W/ CHOLANGIOGRAPHY  11/09/04   SURGEON DR. Luella Cook  . LEFT HEART CATH AND CORONARY ANGIOGRAPHY N/A 09/16/2018   Procedure: LEFT HEART CATH AND CORONARY ANGIOGRAPHY;  Surgeon: Nigel Mormon, MD;  Location: [place] [facility];  Service: Cardiovascular;  Laterality: N/A;  . LEFT HEART CATHETERIZATION WITH CORONARY ANGIOGRAM N/A 10/29/2014   Procedure: LEFT HEART CATHETERIZATION WITH CORONARY ANGIOGRAM;  Surgeon: Laverda Page, MD;  Location: [facility];  Service: Cardiovascular;  Laterality: N/A;  . LOWER EXTREMITY ANGIOGRAM N/A 03/19/2012   Procedure: LOWER EXTREMITY ANGIOGRAM;  Surgeon: Burnell Blanks, MD;  Location: [facility];  Service: Cardiovascular;  Laterality: N/A;  . NECK SURGERY    . PARTIAL COLECTOMY N/A 01/13/2018   Procedure: LAPAROSCOPIC ASSISTED   SIGMOID COLECTOMY ILEOSTOMY;  Surgeon: Clovis Riley, MD;  Location: [facility];   Service: General;  Laterality: N/A;  . PENILE PROSTHESIS IMPLANT  08/14/05   INFRAPUBIC INSERTION OF INFLATABLE PENILE PROSTHESIS; SURGEON DR. Amalia Hailey  . PENILE PROSTHESIS IMPLANT    . PERCUTANEOUS CORONARY STENT INTERVENTION (PCI-S) Right 10/29/2014   Procedure: PERCUTANEOUS CORONARY STENT INTERVENTION (PCI-S);  Surgeon: Laverda Page, MD;  Location: [facility] CATH [facility];  Service: Cardiovascular;  Laterality: Right;  . SHOULDER ARTHROSCOPY    . SPINE SURGERY    . TOE AMPUTATION Left   . TONSILLECTOMY    . TOTAL KNEE ARTHROPLASTY  07/2002   RIGHT KNEE ; SURGEON  DR. [name] ALSO HAD ARTHROSCOPIC RIGHT KNEE IN  10/2001  . TOTAL KNEE ARTHROPLASTY    . ULNAR NERVE TRANSPOSITION Right 10/21/2013   Procedure: RIGHT ELBOW  ULNAR NERVE DECOMPRESSION;  Surgeon: Wynonia Sours, MD;  Location: [place];  Service: Orthopedics;  Laterality: Right;     No  outpatient medications have been marked as taking for the 11/24/19 encounter (Appointment) with Geralynn Rile, MD.     Allergies:   Contrast media [iodinated diagnostic agents], Ivp dye [iodinated diagnostic agents], and Adhesive [tape]   Social History   Tobacco Use  . Smoking status: Current Every Day Smoker    Packs/day: 2.00    Years: 35.00    Pack years: 70.00    Types: Cigarettes, E-cigarettes    Last attempt to quit: 10/28/2011    Years since quitting: 8.0  . Smokeless tobacco: Never Used  Substance Use Topics  . Alcohol use: Not Currently    Comment: "not in a long time"  . Drug use: Not Currently     Family Hx: The patient's family history includes Arthritis in an other family member; Cancer in his father and sister; Colon cancer in his brother; Diabetes in his brother and father; Heart attack in his father; Heart disease in his father; Heart disease (age of onset: 57) in his brother; Hyperlipidemia in his brother, father, and sister; Hypertension in his brother, father, sister, son, and another family member; Varicose  Veins in his father.  ROS:   Please see the history of present illness.    *** All other systems reviewed and are negative.   Prior CV studies:   The following studies were reviewed today:  TTE 09/17/2018 - Left ventricle: The cavity size was normal. There was moderate concentric hypertrophy. Systolic function was normal. The estimated ejection fraction was in the range of 55% to 60%. Features are consistent with a pseudonormal left ventricular filling pattern, with concomitant abnormal relaxation and increased filling pressure (grade 2 diastolic dysfunction). Doppler parameters are consistent with both elevated ventricular end-diastolic filling pressure and elevated left atrial filling pressure. - Mitral valve: Calcified annulus. - Left atrium: The atrium was mildly dilated. - Inferior vena cava: The vessel was dilated. The respirophasic diameter changes was normal, may suggest elevated central venous pressure.  LHC 09/16/2018 LM: Normal LCx: Normal LAD: High Diag 1 40-50% disease LCx: Normal RCA: Patent prox to distal RCA stents. Mild 20% prox late lumen loss LVEDP 32 mmHg  Carotid US 05/2019 Minimal stenosis in bilateral ICA of 1-15%. Heterogeneous plaque. Stenosis in the right external carotid artery (<50%). Left carotid endarterectomy site is patent.  Right vertebral artery flow is not well visualized. Antegrade left vertebral artery flow.  Labs/Other Tests and Data Reviewed:    EKG:  {EKG/Telemetry Strips Reviewed:[id number]}  Recent Labs: 06/19/2019: ALT 10; BUN 17; Creatinine, Ser 2.91; Platelets 200; Potassium 3.2; Sodium 134 11/09/2019: Hemoglobin 9.7   Recent Lipid Panel No results found for: CHOL, TRIG, HDL, CHOLHDL, LDLCALC, LDLDIRECT  Wt Readings from Last 3 Encounters:  10/19/19 260 lb (117.9 kg)  10/15/19 260 lb (117.9 kg)  09/16/19 283 lb (128.4 kg)     Objective:    Vital Signs:  There were no vitals taken for this visit.    {[facility] Virtual Exam (Optional):[phone]::"VITAL SIGNS:  reviewed"}  ASSESSMENT & PLAN:    1. ***  COVID-19 Education: The signs and symptoms of COVID-19 were discussed with the patient and how to seek care for testing (follow up with PCP or arrange E-visit).  ***The importance of social distancing was discussed today.  Time:   Today, I have spent *** minutes with the patient with telehealth technology discussing the above problems.     Medication Adjustments/Labs and Tests Ordered: Current medicines are reviewed at length with the patient today.  Concerns regarding  medicines are outlined above.   Tests Ordered: No orders of the defined types were placed in this encounter.   Medication Changes: No orders of the defined types were placed in this encounter.   Follow Up:  {F/U Format:[phone]} {follow up:15908}  Signed, Evalina Field, MD  11/22/2019 5:22 PM    [facility]

## 2019-11-23 ENCOUNTER — Ambulatory Visit (HOSPITAL_COMMUNITY)
Admission: RE | Admit: 2019-11-23 | Discharge: 2019-11-23 | Disposition: A | Discharge status: Home / Self Care | Payer: 59 | Source: Ambulatory Visit | Attending: Nephrology | Admitting: Nephrology

## 2019-11-23 ENCOUNTER — Other Ambulatory Visit: Payer: Self-pay

## 2019-11-23 ENCOUNTER — Encounter (HOSPITAL_COMMUNITY): Payer: 59

## 2019-11-23 DIAGNOSIS — D631 Anemia in chronic kidney disease: Secondary | ICD-10-CM | POA: Diagnosis not present

## 2019-11-23 DIAGNOSIS — N183 Chronic kidney disease, stage 3 unspecified: Secondary | ICD-10-CM | POA: Diagnosis not present

## 2019-11-23 LAB — IRON AND TIBC
Iron: 45 ug/dL (ref 45–182)
Saturation Ratios: 18 % (ref 17.9–39.5)
TIBC: 252 ug/dL (ref 250–450)
UIBC: 207 ug/dL

## 2019-11-23 LAB — FERRITIN: Ferritin: 192 ng/mL (ref 24–336)

## 2019-11-23 MED ORDER — EPOETIN ALFA-EPBX 10000 UNIT/ML IJ SOLN
INTRAMUSCULAR | Status: AC
  Administered 2019-11-23: 20000 [IU] via SUBCUTANEOUS
  Filled 2019-11-23: qty 2

## 2019-11-23 MED ORDER — EPOETIN ALFA-EPBX 10000 UNIT/ML IJ SOLN: 20000.0000 [IU] | INTRAMUSCULAR | Status: DC

## 2019-11-24 ENCOUNTER — Telehealth: Payer: 59 | Admitting: Cardiovascular Disease

## 2019-11-24 LAB — POCT HEMOGLOBIN-HEMACUE: Hemoglobin: 10.5 g/dL — ABNORMAL LOW (ref 13.0–17.0)

## 2019-11-25 ENCOUNTER — Encounter: Payer: Self-pay | Admitting: Cardiology

## 2019-11-25 ENCOUNTER — Ambulatory Visit: Payer: Medicare Other | Admitting: Cardiology

## 2019-11-25 ENCOUNTER — Other Ambulatory Visit: Payer: Self-pay

## 2019-11-25 VITALS — BP 168/71 | HR 65 | Temp 98.0°F | Resp 16 | Ht 74.0 in | Wt 288.2 lb

## 2019-11-25 DIAGNOSIS — I739 Peripheral vascular disease, unspecified: Secondary | ICD-10-CM

## 2019-11-25 DIAGNOSIS — I129 Hypertensive chronic kidney disease with stage 1 through stage 4 chronic kidney disease, or unspecified chronic kidney disease: Secondary | ICD-10-CM

## 2019-11-25 DIAGNOSIS — N184 Chronic kidney disease, stage 4 (severe): Secondary | ICD-10-CM

## 2019-11-25 DIAGNOSIS — I5032 Chronic diastolic (congestive) heart failure: Secondary | ICD-10-CM

## 2019-11-25 DIAGNOSIS — I2510 Atherosclerotic heart disease of native coronary artery without angina pectoris: Secondary | ICD-10-CM

## 2019-11-25 DIAGNOSIS — E1122 Type 2 diabetes mellitus with diabetic chronic kidney disease: Secondary | ICD-10-CM

## 2019-11-25 DIAGNOSIS — E782 Mixed hyperlipidemia: Secondary | ICD-10-CM

## 2019-11-25 DIAGNOSIS — D638 Anemia in other chronic diseases classified elsewhere: Secondary | ICD-10-CM

## 2019-11-25 DIAGNOSIS — I10 Essential (primary) hypertension: Secondary | ICD-10-CM

## 2019-11-25 DIAGNOSIS — I453 Trifascicular block: Secondary | ICD-10-CM

## 2019-11-25 NOTE — Progress Notes (Signed)
Primary Physician/Referring:  [name], PA-C  Patient ID: [name], male    DOB: 1956-07-30, 64 y.o.   MRN: [id number]  Chief Complaint  Patient presents with  . Coronary Artery Disease  . PAD  . Follow-up    6 month   HPI:    [name]  is a 64 y.o. male  with severe peripheral arterial disease and has history of right below-knee amputation and toe lamputations on the left, multiple peripheral interventions in the past, uncontrolled diabetes mellitus, stage IV CKD, coronary artery disease angioplasty to the right coronary artery on 10/29/2014, right carotid endarterectomy in 2012 , OSA Unable to tolerate CPAP, chronic back pain and neck and back surgery in past and on chronic pain medications.   Coronary Angiography 09/17/2018 revealed widely patent stent and no significant new disease.  Denies claudication and no further left leg foot ulcer.  Continues to use e-cigarettes but has reduced significantly.  Has chronic back pain and is on chronic pain medications.  Past Medical History:  Diagnosis Date  . Carotid artery occlusion 11/10/10   LEFT CAROTID ENDARTERECTOMY  . Complication of anesthesia    BP WENT UP AT [facility] "  . COPD (chronic obstructive pulmonary disease) ([place])   . Diverticulitis   . Diverticulosis of colon (without mention of hemorrhage)   . DJD (degenerative joint disease)   . Fatty liver   . Full dentures   . GERD (gastroesophageal reflux disease)   . H/O hiatal hernia   . Hyperlipidemia   . Hypertension   . Neuromuscular disorder ([place])    peripheral neuropathy  . Non-pressure chronic ulcer of other part of left foot limited to breakdown of skin ([place]) 11/12/2016  . Osteomyelitis ([place])    left 5th metatarsal  . PAD (peripheral artery disease) ([place])    Distal aortogram June 2012. Atherectomy left popliteal artery July 2012.   . Pseudoclaudication 11/15/2018  . Slurred speech    AS PER WIFE IN D/C NOTE 11/10/10  . 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 Unstable  angina ([place]) 09/16/2018  . Wears glasses    Past Surgical History:  Procedure Laterality Date  . AMPUTATION  11/05/2011   Procedure: AMPUTATION RAY;  Surgeon: Wylene Simmer, MD;  Location: [place];  Service: Orthopedics;  Laterality: Right;  Amputation of Right 4&5th Toes  . AMPUTATION Left 11/26/2012   Procedure: AMPUTATION RAY;  Surgeon: Wylene Simmer, MD;  Location: [place];  Service: Orthopedics;  Laterality: Left;  fourth ray amputation  . AMPUTATION Right 08/27/2014   Procedure: Transmetatarsal Amputation;  Surgeon: Newt Minion, MD;  Location: [place];  Service: Orthopedics;  Laterality: Right;  . AMPUTATION Right 01/14/2015   Procedure: AMPUTATION BELOW KNEE;  Surgeon: Newt Minion, MD;  Location: [place];  Service: Orthopedics;  Laterality: Right;  . AMPUTATION Left 10/21/2015   Procedure: Left Foot 5th Ray Amputation;  Surgeon: Newt Minion, MD;  Location: [place];  Service: Orthopedics;  Laterality: Left;  . ANTERIOR FUSION CERVICAL SPINE  02/06/06   C4-5, C5-6, C6-7; SURGEON DR. MAX COHEN  . BACK SURGERY    . BELOW KNEE LEG AMPUTATION Right   . CARDIAC CATHETERIZATION  10/31/04   2009  . CAROTID ENDARTERECTOMY  11/10/10  . CAROTID ENDARTERECTOMY Left 11/10/2010   Subtotal occlusion of left internal carotid artery with left hemispheric transient ischemic attacks.  . CAROTID STENT    . CARPAL TUNNEL RELEASE Right 10/21/2013   Procedure: RIGHT CARPAL TUNNEL RELEASE;  Surgeon: Wynonia Sours, MD;  Location: [place];  Service: Orthopedics;  Laterality: Right;  . CHOLECYSTECTOMY    . COLON SURGERY    . COLONOSCOPY    . COLOSTOMY REVERSAL  05/21/2018   ileostomy reversal  . CYSTOSCOPY WITH STENT PLACEMENT Bilateral 01/13/2018   Procedure: CYSTOSCOPY WITH BILATERAL URETERAL CATHETER PLACEMENT;  Surgeon: Ardis Hughs, MD;  [facility];  Service: Urology;  Laterality: Bilateral;  . ESOPHAGEAL MANOMETRY Bilateral 07/19/2014   Procedure: ESOPHAGEAL MANOMETRY (EM);  Surgeon: Jerene Bears, MD;  [facility];  Service: Gastroenterology;  Laterality: Bilateral;  . FEMORAL ARTERY STENT     x6  . FINGER SURGERY    . FOOT SURGERY  04/25/2016    EXCISION BASE 5TH METATARSAL AND PARTIAL CUBOID LEFT FOOT  . HERNIA REPAIR     LEFT INGUINAL AND UMBILICAL REPAIRS  . HERNIA REPAIR    . I & D EXTREMITY Left 04/25/2016   Procedure: EXCISION BASE 5TH METATARSAL AND PARTIAL CUBOID LEFT FOOT;  Surgeon: Newt Minion, MD;  Location: [place];  Service: Orthopedics;  Laterality: Left;  . ILEOSTOMY  01/13/2018   Procedure: ILEOSTOMY;  Surgeon: Clovis Riley, MD;  Location: [facility];  Service: General;;  . ILEOSTOMY CLOSURE N/A 05/21/2018   Procedure: ILEOSTOMY REVERSAL ERAS PATHWAY;  Surgeon: Clovis Riley, MD;  Location: [place];  Service: General;  Laterality: N/A;  . IR RADIOLOGIST EVAL & MGMT  11/19/2017  . IR RADIOLOGIST EVAL & MGMT  12/03/2017  . IR RADIOLOGIST EVAL & MGMT  12/18/2017  . JOINT REPLACEMENT Right 2001   Total  . LAMINECTOMY     X 3 LUMBAR AND X 2 CERVICAL SPINE OPERATIONS  . LAPAROSCOPIC CHOLECYSTECTOMY W/ CHOLANGIOGRAPHY  11/09/04   SURGEON DR. Luella Cook  . LEFT HEART CATH AND CORONARY ANGIOGRAPHY N/A 09/16/2018   Procedure: LEFT HEART CATH AND CORONARY ANGIOGRAPHY;  Surgeon: Nigel Mormon, MD;  Location: [place] [facility];  Service: Cardiovascular;  Laterality: N/A;  . LEFT HEART CATHETERIZATION WITH CORONARY ANGIOGRAM N/A 10/29/2014   Procedure: LEFT HEART CATHETERIZATION WITH CORONARY ANGIOGRAM;  Surgeon: Laverda Page, MD;  Location: [facility];  Service: Cardiovascular;  Laterality: N/A;  . LOWER EXTREMITY ANGIOGRAM N/A 03/19/2012   Procedure: LOWER EXTREMITY ANGIOGRAM;  Surgeon: Burnell Blanks, MD;  Location: [facility] CATH [facility];  Service: Cardiovascular;  Laterality: N/A;  . NECK SURGERY    . PARTIAL COLECTOMY N/A 01/13/2018   Procedure: LAPAROSCOPIC ASSISTED   SIGMOID COLECTOMY ILEOSTOMY;  Surgeon: Clovis Riley, MD;  Location[facility];   Service: General;  Laterality: N/A;  . PENILE PROSTHESIS IMPLANT  08/14/05   INFRAPUBIC INSERTION OF INFLATABLE PENILE PROSTHESIS; SURGEON DR. Amalia Hailey  . PENILE PROSTHESIS IMPLANT    . PERCUTANEOUS CORONARY STENT INTERVENTION (PCI-S) Right 10/29/2014   Procedure: PERCUTANEOUS CORONARY STENT INTERVENTION (PCI-S);  Surgeon: Laverda Page, MD;  Location: [facility] C[facility] LAB;  Service: Cardiovascular;  Laterality: Right;  . SHOULDER ARTHROSCOPY    . SPINE SURGERY    . TOE AMPUTATION Left   . TONSILLECTOMY    . TOTAL KNEE ARTHROPLASTY  07/2002   RIGHT KNEE ; SURGEON  DR. [name] ALSO HAD ARTHROSCOPIC RIGHT KNEE IN  10/2001  . TOTAL KNEE ARTHROPLASTY    . ULNAR NERVE TRANSPOSITION Right 10/21/2013   Procedure: RIGHT ELBOW  ULNAR NERVE DECOMPRESSION;  Surgeon: Wynonia Sours, MD;  Location: [place];  Service: Orthopedics;  Laterality:  Right;   Social History   Socioeconomic History  . Marital status: Married    Spouse name: Not on file  . Number of children: 3  . Years of education: Not on file  . Highest education level: Not on file  Occupational History  . Occupation: Magazine features editor: UNEMPLOYED  Tobacco Use  . Smoking status: Current Every Day Smoker    Packs/day: 2.00    Years: 35.00    Pack years: 70.00    Types: Cigarettes, E-cigarettes    Last attempt to quit: 10/28/2011    Years since quitting: 8.0  . Smokeless tobacco: Never Used  Substance and Sexual Activity  . Alcohol use: Not Currently    Comment: "not in a long time"  . Drug use: Not Currently  . Sexual activity: Yes    Birth control/protection: Implant  Other Topics Concern  . Not on file  Social History Narrative   ** Merged History Encounter **       HEAVY SMOKER AND CONTINUES TO SMOKE 1 PPD. DOES NOT EXERCISE REGULARLY.  Wife [phone] Lebron Quam). Has 2 sons and daughter. Still active       Social Determinants of Health   Financial Resource Strain:   . Difficulty of Paying  Living Expenses:   Food Insecurity:   . Worried About Charity fundraiser in the Last Year:   . Arboriculturist in the Last Year:   Transportation Needs:   . Film/video editor (Medical):   Marland Kitchen Lack of Transportation (Non-Medical):   Physical Activity:   . Days of Exercise per Week:   . Minutes of Exercise per Session:   Stress:   . Feeling of Stress :   Social Connections:   . Frequency of Communication with Friends and Family:   . Frequency of Social Gatherings with Friends and Family:   . Attends Religious Services:   . Active Member of Clubs or Organizations:   . Attends Archivist Meetings:   Marland Kitchen Marital Status:   Intimate Partner Violence:   . Fear of Current or Ex-Partner:   . Emotionally Abused:   Marland Kitchen Physically Abused:   . Sexually Abused:    ROS  Review of Systems  Constitution: Positive for malaise/fatigue.  Cardiovascular: Positive for leg swelling. Negative for chest pain and dyspnea on exertion.  Musculoskeletal: Positive for arthritis and back pain.  Gastrointestinal: Negative for melena.   Objective   Vitals with BMI 11/25/2019 11/23/2019 11/09/2019  Height 6\' 2"  - -  Weight 288 lbs 3 oz - -  BMI 37.16 - -  Systolic 967 893 810  Diastolic 71 61 53  Pulse 65 72 63    Blood pressure (!) 168/71, pulse 65, temperature 98 F (36.7 C), temperature source Temporal, resp. rate 16, height 6\' 2"  (1.88 m), weight 288 lb 3.2 oz (130.7 kg), SpO2 98 %. Body mass index is 37 kg/m.   Physical Exam  Constitutional: He appears well-developed and well-nourished. No distress.  Cardiovascular: Normal rate, regular rhythm, normal heart sounds and intact distal pulses. Exam reveals no gallop.  No murmur heard. Pulses:      Carotid pulses are on the right side with bruit and on the left side with bruit.      Femoral pulses are 1+ on the right side with bruit and 1+ on the left side with bruit.      Popliteal pulses are 2+ on the right side and 2+ on  the left side.        Dorsalis pedis pulses are 1+ on the left side. Right dorsalis pedis pulse not accessible.       Posterior tibial pulses are 2+ on the left side. Right posterior tibial pulse not accessible.  Right BKA. There is 2+ left leg pitting edema below knee. No JVD  Pulmonary/Chest: Effort normal and breath sounds normal.  Abdominal: Soft. Bowel sounds are normal.  Musculoskeletal:     Cervical back: Neck supple.   Radiology: No results found.  Laboratory examination:   Recent Labs    06/19/19 0047  NA 134*  K 3.2*  CL 102  CO2 20*  GLUCOSE 313*  BUN 17  CREATININE 2.91*  CALCIUM 7.8*  GFRNONAA 22*  GFRAA 25*   CMP Latest Ref Rng & Units 06/19/2019 09/18/2018 09/16/2018  Glucose 70 - 99 mg/dL 313(H) 155(H) -  BUN 8 - 23 mg/dL 17 35(H) -  Creatinine 0.61 - 1.24 mg/dL 2.91(H) 3.34(H) 2.11(H)  Sodium 135 - 145 mmol/L 134(L) 132(L) -  Potassium 3.5 - 5.1 mmol/L 3.2(L) 3.4(L) -  Chloride 98 - 111 mmol/L 102 99 -  CO2 22 - 32 mmol/L 20(L) 24 -  Calcium 8.9 - 10.3 mg/dL 7.8(L) 8.0(L) -  Total Protein 6.5 - 8.1 g/dL 5.6(L) - -  Total Bilirubin 0.3 - 1.2 mg/dL 0.7 - -  Alkaline Phos 38 - 126 U/L 107 - -  AST 15 - 41 U/L 17 - -  ALT 0 - 44 U/L 10 - -   CBC Latest Ref Rng & Units 11/23/2019 11/09/2019 10/26/2019  WBC 4.0 - 10.5 K/uL - - -  Hemoglobin 13.0 - 17.0 g/dL 10.5(L) 9.7(L) 9.8(L)  Hematocrit 39.0 - 52.0 % - - -  Platelets 150 - 400 K/uL - - -   Lipid Panel  No results found for: CHOL, TRIG, HDL, CHOLHDL, VLDL, LDLCALC, LDLDIRECT HEMOGLOBIN A1C Lab Results  Component Value Date   HGBA1C 5.7 (H) 09/16/2018   MPG 116.89 09/16/2018   External labs:  Cholesterol, total 142.000 m 08/19/2018 HDL 32.000 mg 08/19/2018 LDL 79.000 mg 08/19/2018 Triglycerides 156.000 m 08/19/2018  A1C 6.900 % 09/02/2019  Creatinine, Serum 3.590 mg/ 10/05/2019 Potassium 4.300 mm 10/05/2019 ALT (SGPT) 12.000 IU/ 10/05/2019  Hemoglobin 9.700 11/09/2019; INR 1.000 01/07/2018 Platelets 232.000 x1  10/05/2019  Medications   Current Outpatient Medications  Medication Instructions  . amLODipine (NORVASC) 10 mg, Oral, Daily  . aspirin EC 81 mg, Oral, Daily  . atorvastatin (LIPITOR) 40 mg, Oral, Daily  . carvedilol (COREG) 37.5 mg, Oral, 2 times daily  . clobetasol (TEMOVATE) 0.05 % external solution 1 application, Topical, Daily PRN  . clopidogrel (PLAVIX) 75 mg, Oral, Daily  . clotrimazole-betamethasone (LOTRISONE) cream 1 application, Topical, As needed  . collagenase (SANTYL) ointment Topical, Daily  . fentaNYL (DURAGESIC) 50 mcg, Transdermal, every 72 hours  . ferrous sulfate 325 mg, Oral, Daily with breakfast  . fluticasone (CUTIVATE) 4.33 % cream 1 application, Topical, 2 times daily PRN  . folic acid (FOLVITE) 295 mcg, Oral, Daily  . Glucosamine HCl (GLUCOSAMINE PO) 2,000 mg, Oral, Daily  . HumaLOG KwikPen 15 Units, Subcutaneous, 3 times daily with meals, 15 units with breakfast, lunch, & dinner-- pt may increase dose by 1 unit if sugar is elevated  . ketoconazole (NIZORAL) 2 % cream 1 application, Topical, 2 times daily  . linaclotide (LINZESS) 145 mcg, Oral, Daily before breakfast  . Multiple Vitamin (MULTIVITAMIN) tablet 1 tablet, Oral, Daily  .  NEEDLE, REUSABLE, 22 G 22G X 1-1/2" MISC 1 Units, Does not apply, As directed, For B12 IM inj  . omega-3 acid ethyl esters (LOVAZA) 2 g, Oral, 2 times daily  . ondansetron (ZOFRAN) 4 mg, Oral, Every 6 hours PRN  . oxyCODONE-acetaminophen (PERCOCET/ROXICET) 5-325 MG tablet 1 tablet, Oral, Every 8 hours PRN  . pantoprazole (PROTONIX) 40 mg, Oral, Daily  . Polyvinyl Alcohol-Povidone PF (REFRESH) 1.4-0.6 % SOLN 1 drop, Both Eyes, Daily  . pregabalin (LYRICA) 100 mg, Oral, 3 times daily  . rOPINIRole (REQUIP) 2 mg, Oral, Daily at bedtime  . Syringe/Needle, Disp, (SYRINGE 3CC/22GX1-1/2") 22G X 1-1/2" 3 ML MISC 1 Syringe, Does not apply, As directed, For b12 IM inj  . tamsulosin (FLOMAX) 0.4 mg, Oral, Daily  . torsemide (DEMADEX) 20 mg,  Oral, Daily, May take an additional tablet for increased swelling.    Cardiac Studies:   Left popliteal artery atherectomy in June 2012 and left below knee PTA 2014 by Dr. Brunetta Jeans at [facility].   Lexiscan sestamibi stress test 03/18/2017: 1. The resting electrocardiogram demonstrated normal sinus rhythm, RBBB and no resting arrhythmias. Stress EKG is non-diagnostic for ischemia as it a pharmacologic stress using Lexiscan. Occasional PVC noted. Stress symptoms included dyspnea, dizziness and chest pain. 2. The LV is dilated both at rest and stress images. The LV end diastolic volume was 563OV. Perfusion images reveal inferior soft tissue attenuation artifact. There is no ischemia or scar. Overall left ventricular systolic function was normal without regional wall motion abnormalities. The left ventricular ejection fraction was calculated to be 51%. This is a low risk study. Compared to the study done on 10/15/2014, inferior wall ischemia and associated inferior hypokinesis is no longer present.  Coronary angiogram 09/17/18: Widely patent stent stents. Normal LVEF. Coronary Angiogram 10/29/2014: CAD s/p stenting of the distal, mid and proximal RCA with implantation of 3 overlapping drug-eluting stent, from distal to proximal 2.5 x 38 mm, 2.75 x 38 mm and a 2.75 x 20 mm Promus premier DES.  Echocardiogram 09/16/2018:  Left ventricle: The cavity size was normal. There was moderate concentric hypertrophy. Systolic function was normal. The estimated ejection fraction was in the range of 55% to 60%. Features are consistent with a pseudonormal left ventricular filling pattern, with concomitant abnormal relaxation and increased filling pressure (grade 2 diastolic dysfunction). Doppler parameters are consistent with both elevated ventricular end-diastolic filling pressure and elevated left atrial filling pressure. Mitral valve: Calcified annulus. Left atrium: The atrium was mildly dilated. Inferior vena cava: The vessel  was dilated. The respirophasic diameter changes was normal, may suggest elevated central venous pressure.  Carotid artery duplex 06/09/2019:  Minimal stenosis in bilateral ICA of 1-15%. Heterogeneous plaque. Stenosis  in the right external carotid artery (<50%).  Left carotid endarterectomy site is patent.  Right vertebral artery flow is not well visualized. Antegrade left  vertebral artery flow.  No significant change from 03/01/2017. Follow up studies when clinically  Indicated.  EKG 11/25/2019: Sinus rhythm with first-degree block at the rate of 65 bpm, left atrial enlargement, left axis deviation, left anterior fascicular block.  Right bundle branch block.  LVH.  No evidence of ischemia.  Nonspecific T abnormality.  No significant change from EKG 05/25/2019, EKG 10/03/2018.  Assessment     ICD-10-CM   1. Coronary artery disease involving native coronary artery of native heart without angina pectoris  I25.10 EKG 12-Lead  2. Peripheral artery disease (HCC)  I73.9   3. Essential hypertension  I10 TSH  4. Chronic diastolic heart failure, NYHA class 2 (HCC)  I50.32   5. Trifascicular block  I45.3   6. 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 Mixed hyperlipidemia  E78.2 Lipid Panel With LDL/HDL Ratio  8. Anemia of chronic disease  D63.8 Fe+TIBC+Fer    Recommendations:   HALEEM HANNER  is a 64 y.o. male  with severe peripheral arterial disease and has history of right below-knee amputation and toe lamputations on the left, multiple peripheral interventions in the past, uncontrolled diabetes mellitus, stage IV CKD, coronary artery disease angioplasty to the right coronary artery on 10/29/2014, right carotid endarterectomy in 2012 , OSA Unable to tolerate CPAP, chronic back pain and neck and back surgery in past and on chronic pain medications.   Long-term patient of mine was referred by [name], [facility] (PCP) to Dr. Evalee Jefferson ([place]) for  evaluation of syncope, underwent event monitoring, I do not see the report.  This was in January 2021.  On questioning, patient had hypoglycemic event but no syncope.  He remains angina free, no symptoms of claudication, no leg ulceration in the left leg, has right below-knee amputation.  He has gradually deteriorated with regard to physical activity and exercise tolerance and also overall wellbeing.  He has now also developed diabetic retinopathy and underwent retinal injection this morning and is in severe pain, blood pressure is elevated but states that his blood pressure has been very well controlled.  Patient is extremely sensitive to blood pressure and would let me know if greater than 592 mmHg systolic.  Recently found to be anemic and has been started on iron supplements.  Presently on aspirin and Plavix due to PAD and severe small vessel disease, if anemia does not improve, I could certainly try to discontinue Plavix for a short time. Anemia could be related to chronic disease and or renal disease.   He does have underlying trifascicular block that is chronic but without any symptoms to suggest need for pacemaker implantation.  Renal function has deteriorated over time and suspect he probably will develop ESRD. Still Vaping and urged abstinance. DM is now controlled, previously uncontrolled. He has not had recent lipids or TSH and will obtain this. Along with iron studies.   OV in 3 months.   Adrian Prows, MD, [facility] 11/26/2019, 6:24 AM [facility]. [place] Office: [phone]

## 2019-12-02 ENCOUNTER — Telehealth: Payer: Self-pay | Admitting: *Deleted

## 2019-12-02 ENCOUNTER — Encounter: Payer: Self-pay | Admitting: Cardiovascular Disease

## 2019-12-02 NOTE — Telephone Encounter (Signed)
Received message from [name] to not have replacement ZIO patch monitor sent.   Monitor had already been shipped.  Patient contacted, LMVM,  to inform just to mail monitor back to [facility] in enclosed box with prepaid postage on it.  There would be no charge on monitor returned to [facility] with no data on it.  [employer] representative will be called to cancel order.

## 2019-12-02 NOTE — Telephone Encounter (Signed)
Thanks [place], he is asymptomatic now for last 2 months.  JG

## 2019-12-02 NOTE — Telephone Encounter (Signed)
We received a request for a copy of 7 day ZIO XT long term holter monitor that was enrolled 09/15/2019 from [name]. [employer] was called regarding monitor lost status on their website.  According to [facility] records, patient returned the monitor.  They can track the monitor up to delivery at the [facility] office.  Monitor was never delivered to [facility].  They initiated a search request, however, since this was back in January, it is unlikely they would locate [name]' monitor. I will re-enroll patient for a replacement 7 day ZIO XT monitor to be mailed to the patients home and will mark order to cc results to [name].

## 2019-12-02 NOTE — Telephone Encounter (Signed)
Please do not order

## 2019-12-04 ENCOUNTER — Other Ambulatory Visit (HOSPITAL_COMMUNITY): Payer: Self-pay | Admitting: *Deleted

## 2019-12-07 ENCOUNTER — Encounter (HOSPITAL_COMMUNITY): Payer: 59

## 2019-12-07 ENCOUNTER — Other Ambulatory Visit: Payer: Self-pay

## 2019-12-07 ENCOUNTER — Ambulatory Visit (HOSPITAL_COMMUNITY)
Admission: RE | Admit: 2019-12-07 | Discharge: 2019-12-07 | Disposition: A | Discharge status: Home / Self Care | Payer: 59 | Source: Ambulatory Visit | Attending: Nephrology | Admitting: Nephrology

## 2019-12-07 DIAGNOSIS — D631 Anemia in chronic kidney disease: Secondary | ICD-10-CM | POA: Diagnosis not present

## 2019-12-07 DIAGNOSIS — N183 Chronic kidney disease, stage 3 unspecified: Secondary | ICD-10-CM | POA: Diagnosis not present

## 2019-12-07 LAB — POCT HEMOGLOBIN-HEMACUE: Hemoglobin: 10.4 g/dL — ABNORMAL LOW (ref 13.0–17.0)

## 2019-12-07 MED ORDER — EPOETIN ALFA-EPBX 10000 UNIT/ML IJ SOLN
INTRAMUSCULAR | Status: AC
  Administered 2019-12-07: 20000 [IU] via SUBCUTANEOUS
  Filled 2019-12-07: qty 2

## 2019-12-07 MED ORDER — EPOETIN ALFA-EPBX 10000 UNIT/ML IJ SOLN: 20000.0000 [IU] | INTRAMUSCULAR | Status: DC

## 2019-12-08 ENCOUNTER — Other Ambulatory Visit: Payer: Self-pay | Admitting: Cardiovascular Disease

## 2019-12-08 DIAGNOSIS — R55 Syncope and collapse: Secondary | ICD-10-CM

## 2019-12-16 ENCOUNTER — Encounter: Payer: Self-pay | Admitting: Family

## 2019-12-16 ENCOUNTER — Other Ambulatory Visit: Payer: Self-pay

## 2019-12-16 ENCOUNTER — Ambulatory Visit (INDEPENDENT_AMBULATORY_CARE_PROVIDER_SITE_OTHER): Payer: 59 | Admitting: Family

## 2019-12-16 VITALS — Ht 74.0 in | Wt 288.0 lb

## 2019-12-16 DIAGNOSIS — L97221 Non-pressure chronic ulcer of left calf limited to breakdown of skin: Secondary | ICD-10-CM | POA: Diagnosis not present

## 2019-12-16 DIAGNOSIS — I6523 Occlusion and stenosis of bilateral carotid arteries: Secondary | ICD-10-CM

## 2019-12-16 MED ORDER — DOXYCYCLINE HYCLATE 100 MG PO TABS: 100.0000 mg | ORAL_TABLET | Freq: Two times a day (BID) | ORAL | 0 refills | Status: DC

## 2019-12-16 NOTE — Progress Notes (Signed)
Office Visit Note   Patient: [name]           Date of Birth: [birth year]           MRN: [id number] Visit Date: 12/16/2019              Requested by: [name], PA-C [address] [place],  [place] [address] PCP: [name], PA-C  Chief Complaint  Patient presents with  . Left Leg - Pain      HPI: The patient is a 64 year old gentleman with a history of a right below-knee amputation who presents today complaining of new ulceration to his left lower extremity from his double upright brace.  He states he first noticed the wound about 4 days ago however he will go on to say that it has been going on for some time prior but last Thursday was the first time that he looked at it.  His metal rod of his brace has been pressing into the medial side of his calf causing a deep ulcer.  He complains of a foul odor associated with this.  Assessment & Plan: Visit Diagnoses: No diagnosis found.  Plan: We will provide a doxycycline course.  Encouraged him to use Iodosorb that he has in his possession for wound care.  He will be following up with [facility] following this visit to have modifications made to his bracing.  Discussed the importance of not wearing his brace to offload pressure to the wound.  He does have a motorized scooter that he is using for mobility.  Follow-Up Instructions: Return in about 2 weeks (around 12/30/2019).   Ortho Exam  Patient is alert, oriented, no adenopathy, well-dressed, normal affect, normal respiratory effort. On examination of the left lower limb he has some trace edema unfortunately to his medial calf just beneath where the bar of his double upright brace presses and he has a 15 mm in diameter ulceration this is 3 mm deep filled in with 90% fibrinous exudative tissue.  There is no purulence he does have a foul odor associated with this there are some mild surrounding erythema there is no cellulitis.  Imaging: No results found. No images are  attached to the encounter.  Labs: Lab Results  Component Value Date   HGBA1C 5.7 (H) 09/16/2018   HGBA1C 7.7 (H) 05/15/2018   HGBA1C 7.1 (H) 01/07/2018   ESRSEDRATE 35 (H) 09/03/2016   ESRSEDRATE 111 (H) 05/11/2016   ESRSEDRATE 98 (H) 05/04/2016   CRP 1.2 (H) 09/03/2016   CRP 4.7 (H) 05/11/2016   CRP 8.0 (H) 05/04/2016   REPTSTATUS 11/15/2017 FINAL 11/10/2017   GRAMSTAIN  11/04/2017    ABUNDANT WBC PRESENT, PREDOMINANTLY PMN MODERATE GRAM NEGATIVE RODS MODERATE GRAM POSITIVE COCCI Performed at [facility], [place] [address] [place], [place] [address]    CULT  11/10/2017    NO GROWTH 5 DAYS Performed at [facility] [address], [place], [place] [address]    LABORGA STAPHYLOCOCCUS AUREUS 09/26/2014     Lab Results  Component Value Date   ALBUMIN 2.9 (L) 06/19/2019   ALBUMIN 3.5 05/15/2018   ALBUMIN 3.4 (L) 01/07/2018   PREALBUMIN 19.0 05/04/2016   PREALBUMIN 25.0 12/16/2015    Lab Results  Component Value Date   MG 1.9 05/24/2018   MG 2.1 05/23/2018   MG 1.3 (L) 05/22/2018   No results found for: [facility]  Lab Results  Component Value Date   PREALBUMIN 19.0 05/04/2016  PREALBUMIN 25.0 12/16/2015   CBC EXTENDED Latest Ref Rng & Units 12/07/2019 11/23/2019 11/09/2019  WBC 4.0 - 10.5 K/uL - - -  RBC 4.22 - 5.81 MIL/uL - - -  HGB 13.0 - 17.0 g/dL 10.4(L) 10.5(L) 9.7(L)  HCT 39.0 - 52.0 % - - -  PLT 150 - 400 K/uL - - -  NEUTROABS 1.7 - 7.7 K/uL - - -  LYMPHSABS 0.7 - 4.0 K/uL - - -     Body mass index is 36.98 kg/m.  Orders:  No orders of the defined types were placed in this encounter.  Meds ordered this encounter  Medications  . doxycycline (VIBRA-TABS) 100 MG tablet    Sig: Take 1 tablet (100 mg total) by mouth 2 (two) times daily.    Dispense:  28 tablet    Refill:  0     Procedures: No procedures performed  Clinical Data: No additional findings.  ROS:  All other systems negative, except as noted in the HPI. Review of  Systems  Constitutional: Negative for chills and fever.  Skin: Positive for wound.    Objective: Vital Signs: Ht 6\' 2"  (1.88 m)   Wt 288 lb (130.6 kg)   BMI 36.98 kg/m   Specialty Comments:  No specialty comments available.  PMFS History: Patient Active Problem List   Diagnosis Date Noted  . 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 Pseudoclaudication 11/15/2018  . Essential hypertension 09/18/2018  . S/P BKA (below knee amputation), right ([place]) 09/18/2018  . Amputation of toe of left foot ([place]) 09/18/2018  . Peripheral artery disease ([place]) 09/18/2018  . S/P carotid endarterectomy 09/18/2018  . OSA on CPAP 09/18/2018  . Chronic back pain 09/18/2018  . Status post reversal of ileostomy 05/21/2018  . Normocytic anemia 02/14/2018  . Intra-abdominal abscess ([place]) 11/04/2017  . Chronic venous hypertension (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 ([place]) 12/11/2016  . Unilateral primary osteoarthritis, left knee 10/11/2016  . History of right below knee amputation ([place]) 07/12/2016  . Diabetic foot infection ([place]) 05/04/2016  . Type 2 diabetes mellitus with insulin therapy ([place]) 12/16/2015  . Amputated toe ([place]) 10/21/2015  . Leg edema, left 09/03/2015  . CAD (dz of distal, mid and proximal RCA with implantation of 3 overlapping drug-eluting stent,) 09/03/2015  . Chronic diastolic heart failure, NYHA class 2 ([place]) 09/03/2015  . Angina pectoris associated with type 2 diabetes mellitus ([place]) 10/28/2014  . Hyperlipidemia 08/25/2014  . Chronic total occlusion of artery of the extremities ([place]) 04/08/2012  . Onychomycosis 02/01/2012  . Occlusion and stenosis of carotid artery without mention of cerebral infarction 06/12/2011  . GERD (gastroesophageal reflux disease) 05/08/2011  . Barrett's esophagus without dysplasia 05/08/2011  . Former tobacco use 02/01/2011   Past Medical History:  Diagnosis Date  . Carotid artery occlusion 11/10/10   LEFT CAROTID ENDARTERECTOMY    . Complication of anesthesia    BP WENT UP AT [facility] "  . COPD (chronic obstructive pulmonary disease) ([place])   . Diverticulitis   . Diverticulosis of colon (without mention of hemorrhage)   . DJD (degenerative joint disease)   . Fatty liver   . Full dentures   . GERD (gastroesophageal reflux disease)   . H/O hiatal hernia   . Hyperlipidemia   . Hypertension   . Neuromuscular disorder ([place])    peripheral neuropathy  . Non-pressure chronic ulcer of other part of left foot limited to breakdown of skin ([place]) 11/12/2016  . 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 PAD (peripheral artery disease) ([place])    Distal aortogram June 2012. Atherectomy left popliteal artery July 2012.   . Pseudoclaudication 11/15/2018  . Slurred speech    AS PER WIFE IN D/C NOTE 11/10/10  . 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 Unstable angina ([place]) 09/16/2018  . Wears glasses     Family History  Problem Relation Age of Onset  . Heart disease Father        Before age 48-  CAD, BPG  . Diabetes Father        Amputation  . Cancer Father        PROSTATE  . Hyperlipidemia Father   . Hypertension Father   . Heart attack Father        Triple BPG  . Varicose Veins Father   . Colon cancer Brother   . Diabetes Brother   . Heart disease Brother 13       A-Fib. Before age 23  . Hyperlipidemia Brother   . Hypertension Brother   . Cancer Sister        Breast  . Hyperlipidemia Sister   . Hypertension Sister   . Hypertension Son   . Arthritis Other        GRANDMOTHER  . Hypertension Other        OTHER FAMILY MEMBERS    Past Surgical History:  Procedure Laterality Date  . AMPUTATION  11/05/2011   Procedure: AMPUTATION RAY;  Surgeon: Wylene Simmer, MD;  Location: [facility];  Service: Orthopedics;  Laterality: Right;  Amputation of Right 4&5th Toes  . AMPUTATION Left 11/26/2012   Procedure: AMPUTATION RAY;  Surgeon: Wylene Simmer, MD;  Location: [facility];  Service: Orthopedics;  Laterality: Left;  fourth ray amputation  .  AMPUTATION Right 08/27/2014   Procedure: Transmetatarsal Amputation;  Surgeon: Newt Minion, MD;  Location: [facility];  Service: Orthopedics;  Laterality: Right;  . AMPUTATION Right 01/14/2015   Procedure: AMPUTATION BELOW KNEE;  Surgeon: Newt Minion, MD;  Location: [facility];  Service: Orthopedics;  Laterality: Right;  . AMPUTATION Left 10/21/2015   Procedure: Left Foot 5th Ray Amputation;  Surgeon: Newt Minion, MD;  Location: [facility];  Service: Orthopedics;  Laterality: Left;  . ANTERIOR FUSION CERVICAL SPINE  02/06/06   C4-5, C5-6, C6-7; SURGEON DR. MAX COHEN  . BACK SURGERY    . BELOW KNEE LEG AMPUTATION Right   . CARDIAC CATHETERIZATION  10/31/04   2009  . CAROTID ENDARTERECTOMY  11/10/10  . CAROTID ENDARTERECTOMY Left 11/10/2010   Subtotal occlusion of left internal carotid artery with left hemispheric transient ischemic attacks.  . CAROTID STENT    . CARPAL TUNNEL RELEASE Right 10/21/2013   Procedure: RIGHT CARPAL TUNNEL RELEASE;  Surgeon: Wynonia Sours, MD;  Location: [facility];  Service: Orthopedics;  Laterality: Right;  . CHOLECYSTECTOMY    . COLON SURGERY    . COLONOSCOPY    . COLOSTOMY REVERSAL  05/21/2018   ileostomy reversal  . CYSTOSCOPY WITH STENT PLACEMENT Bilateral 01/13/2018   Procedure: CYSTOSCOPY WITH BILATERAL URETERAL CATHETER PLACEMENT;  Surgeon: Ardis Hughs, MD;  Location: [facility];  Service: Urology;  Laterality: Bilateral;  . ESOPHAGEAL MANOMETRY Bilateral 07/19/2014   Procedure: ESOPHAGEAL MANOMETRY (EM);  Surgeon: Jerene Bears, MD;  Location: [facility];  Service: Gastroenterology;  Laterality: Bilateral;  . FEMORAL ARTERY STENT     x6  . FINGER SURGERY    . FOOT SURGERY  04/25/2016  EXCISION BASE 5TH METATARSAL AND PARTIAL CUBOID LEFT FOOT  . HERNIA REPAIR     LEFT INGUINAL AND UMBILICAL REPAIRS  . HERNIA REPAIR    . I & D EXTREMITY Left 04/25/2016   Procedure: EXCISION BASE 5TH METATARSAL AND PARTIAL CUBOID LEFT FOOT;  Surgeon: Newt Minion,  MD;  Location: [facility];  Service: Orthopedics;  Laterality: Left;  . ILEOSTOMY  01/13/2018   Procedure: ILEOSTOMY;  Surgeon: Clovis Riley, MD;  Location: [facility];  Service: General;;  . ILEOSTOMY CLOSURE N/A 05/21/2018   Procedure: ILEOSTOMY REVERSAL ERAS PATHWAY;  Surgeon: Clovis Riley, MD;  Location: [facility];  Service: General;  Laterality: N/A;  . IR RADIOLOGIST EVAL & MGMT  11/19/2017  . IR RADIOLOGIST EVAL & MGMT  12/03/2017  . IR RADIOLOGIST EVAL & MGMT  12/18/2017  . JOINT REPLACEMENT Right 2001   Total  . LAMINECTOMY     X 3 LUMBAR AND X 2 CERVICAL SPINE OPERATIONS  . LAPAROSCOPIC CHOLECYSTECTOMY W/ CHOLANGIOGRAPHY  11/09/04   SURGEON DR. Luella Cook  . LEFT HEART CATH AND CORONARY ANGIOGRAPHY N/A 09/16/2018   Procedure: LEFT HEART CATH AND CORONARY ANGIOGRAPHY;  Surgeon: Nigel Mormon, MD;  Location: [facility];  Service: Cardiovascular;  Laterality: N/A;  . LEFT HEART CATHETERIZATION WITH CORONARY ANGIOGRAM N/A 10/29/2014   Procedure: LEFT HEART CATHETERIZATION WITH CORONARY ANGIOGRAM;  Surgeon: Laverda Page, MD;  Location: [facility];  Service: Cardiovascular;  Laterality: N/A;  . LOWER EXTREMITY ANGIOGRAM N/A 03/19/2012   Procedure: LOWER EXTREMITY ANGIOGRAM;  Surgeon: Burnell Blanks, MD;  Location: [facility] C[facility];  Service: Cardiovascular;  Laterality: N/A;  . NECK SURGERY    . PARTIAL COLECTOMY N/A 01/13/2018   Procedure: LAPAROSCOPIC ASSISTED   SIGMOID COLECTOMY ILEOSTOMY;  Surgeon: Clovis Riley, MD;  Location: [facility];  Service: General;  Laterality: N/A;  . PENILE PROSTHESIS IMPLANT  08/14/05   INFRAPUBIC INSERTION OF INFLATABLE PENILE PROSTHESIS; SURGEON DR. Amalia Hailey  . PENILE PROSTHESIS IMPLANT    . PERCUTANEOUS CORONARY STENT INTERVENTION (PCI-S) Right 10/29/2014   Procedure: PERCUTANEOUS CORONARY STENT INTERVENTION (PCI-S);  Surgeon: Laverda Page, MD;  Location: [facility];  Service: Cardiovascular;  Laterality: Right;  . SHOULDER ARTHROSCOPY     . SPINE SURGERY    . TOE AMPUTATION Left   . TONSILLECTOMY    . TOTAL KNEE ARTHROPLASTY  07/2002   RIGHT KNEE ; SURGEON  DR. [name] ALSO HAD ARTHROSCOPIC RIGHT KNEE IN  10/2001  . TOTAL KNEE ARTHROPLASTY    . ULNAR NERVE TRANSPOSITION Right 10/21/2013   Procedure: RIGHT ELBOW  ULNAR NERVE DECOMPRESSION;  Surgeon: Wynonia Sours, MD;  Location: [facility];  Service: Orthopedics;  Laterality: Right;   Social History   Occupational History  . Occupation: Magazine features editor: UNEMPLOYED  Tobacco Use  . Smoking status: Current Every Day Smoker    Packs/day: 2.00    Years: 35.00    Pack years: 70.00    Types: Cigarettes, E-cigarettes    Last attempt to quit: 10/28/2011    Years since quitting: 8.1  . Smokeless tobacco: Never Used  Substance and Sexual Activity  . Alcohol use: Not Currently    Comment: "not in a long time"  . Drug use: Not Currently  . Sexual activity: Yes    Birth control/protection: Implant

## 2019-12-18 ENCOUNTER — Other Ambulatory Visit (HOSPITAL_COMMUNITY): Payer: Self-pay | Admitting: *Deleted

## 2019-12-21 ENCOUNTER — Other Ambulatory Visit: Payer: Self-pay

## 2019-12-21 ENCOUNTER — Encounter (HOSPITAL_COMMUNITY)
Admission: RE | Admit: 2019-12-21 | Discharge: 2019-12-21 | Disposition: A | Discharge status: Home / Self Care | Payer: 59 | Source: Ambulatory Visit | Attending: Nephrology | Admitting: Nephrology

## 2019-12-21 DIAGNOSIS — N183 Chronic kidney disease, stage 3 unspecified: Secondary | ICD-10-CM | POA: Diagnosis present

## 2019-12-21 DIAGNOSIS — D631 Anemia in chronic kidney disease: Secondary | ICD-10-CM | POA: Diagnosis not present

## 2019-12-21 LAB — IRON AND TIBC
Iron: 58 ug/dL (ref 45–182)
Saturation Ratios: 24 % (ref 17.9–39.5)
TIBC: 246 ug/dL — ABNORMAL LOW (ref 250–450)
UIBC: 188 ug/dL

## 2019-12-21 LAB — POCT HEMOGLOBIN-HEMACUE: Hemoglobin: 10.6 g/dL — ABNORMAL LOW (ref 13.0–17.0)

## 2019-12-21 LAB — FERRITIN: Ferritin: 168 ng/mL (ref 24–336)

## 2019-12-21 MED ORDER — EPOETIN ALFA-EPBX 10000 UNIT/ML IJ SOLN
INTRAMUSCULAR | Status: AC
  Administered 2019-12-21: 20000 [IU] via SUBCUTANEOUS
  Filled 2019-12-21: qty 2

## 2019-12-21 MED ORDER — EPOETIN ALFA-EPBX 10000 UNIT/ML IJ SOLN: 20000.0000 [IU] | Freq: Once | INTRAMUSCULAR | Status: AC

## 2019-12-28 ENCOUNTER — Ambulatory Visit: Payer: 59 | Admitting: Physician Assistant

## 2020-01-04 ENCOUNTER — Ambulatory Visit (HOSPITAL_COMMUNITY)
Admission: RE | Admit: 2020-01-04 | Discharge: 2020-01-04 | Disposition: A | Discharge status: Home / Self Care | Payer: 59 | Source: Ambulatory Visit | Attending: Nephrology | Admitting: Nephrology

## 2020-01-04 ENCOUNTER — Encounter (INDEPENDENT_AMBULATORY_CARE_PROVIDER_SITE_OTHER): Payer: Self-pay | Admitting: Ophthalmology

## 2020-01-04 ENCOUNTER — Ambulatory Visit (INDEPENDENT_AMBULATORY_CARE_PROVIDER_SITE_OTHER): Payer: 59 | Admitting: Ophthalmology

## 2020-01-04 ENCOUNTER — Other Ambulatory Visit: Payer: Self-pay

## 2020-01-04 DIAGNOSIS — N183 Chronic kidney disease, stage 3 unspecified: Secondary | ICD-10-CM | POA: Insufficient documentation

## 2020-01-04 DIAGNOSIS — E113412 Type 2 diabetes mellitus with severe nonproliferative diabetic retinopathy with macular edema, left eye: Secondary | ICD-10-CM

## 2020-01-04 DIAGNOSIS — H3561 Retinal hemorrhage, right eye: Secondary | ICD-10-CM

## 2020-01-04 DIAGNOSIS — E113311 Type 2 diabetes mellitus with moderate nonproliferative diabetic retinopathy with macular edema, right eye: Secondary | ICD-10-CM

## 2020-01-04 DIAGNOSIS — I6523 Occlusion and stenosis of bilateral carotid arteries: Secondary | ICD-10-CM

## 2020-01-04 DIAGNOSIS — D631 Anemia in chronic kidney disease: Secondary | ICD-10-CM | POA: Diagnosis not present

## 2020-01-04 DIAGNOSIS — H43813 Vitreous degeneration, bilateral: Secondary | ICD-10-CM | POA: Diagnosis not present

## 2020-01-04 LAB — POCT HEMOGLOBIN-HEMACUE: Hemoglobin: 11.7 g/dL — ABNORMAL LOW (ref 13.0–17.0)

## 2020-01-04 MED ORDER — EPOETIN ALFA-EPBX 10000 UNIT/ML IJ SOLN
INTRAMUSCULAR | Status: AC
  Filled 2020-01-04: qty 2

## 2020-01-04 MED ORDER — EPOETIN ALFA-EPBX 10000 UNIT/ML IJ SOLN
20000.0000 [IU] | Freq: Once | INTRAMUSCULAR | Status: DC
  Administered 2020-01-04: 20000 [IU] via SUBCUTANEOUS

## 2020-01-04 MED ORDER — BEVACIZUMAB CHEMO INJECTION 1.25MG/0.05ML SYRINGE FOR KALEIDOSCOPE
1.2500 mg | INTRAVITREAL | Status: AC | PRN
  Administered 2020-01-04: 1.25 mg via INTRAVITREAL

## 2020-01-04 NOTE — Progress Notes (Signed)
01/04/2020     CHIEF COMPLAINT Patient presents for Diabetic Eye Exam   HISTORY OF PRESENT ILLNESS: [name] is a 64 y.o. male who presents to the clinic today for:   HPI    Diabetic Eye Exam    Vision is stable.  Associated Symptoms Floaters.  Negative for Flashes.  Diabetes characteristics include Type 2.  Blood sugar level is controlled.  Last Blood Glucose 140.  Last A1C 5.6.  I, the attending physician,  performed the HPI with the patient and updated documentation appropriately.          Comments    5.5 Week Diabetic Exam OS. Possible Avastin OS. OCT  Pt states vision is stable. Sees occasional floaters, has not noticed any new ones. Denies FOL       Last edited by Tilda Franco on 01/04/2020  1:43 PM. (History)      Referring physician: [name], PA-C [place],  [place] [id number]  HISTORICAL INFORMATION:   Selected notes from the MEDICAL RECORD NUMBER    Lab Results  Component Value Date   HGBA1C 5.7 (H) 09/16/2018     CURRENT MEDICATIONS: Current Outpatient Medications (Ophthalmic Drugs)  Medication Sig  . Polyvinyl Alcohol-Povidone PF (REFRESH) 1.4-0.6 % SOLN Place 1 drop into both eyes daily.   No current facility-administered medications for this visit. (Ophthalmic Drugs)   Current Outpatient Medications (Other)  Medication Sig  . amLODipine (NORVASC) 10 MG tablet Take 1 tablet (10 mg total) by mouth daily.  Marland Kitchen aspirin EC 81 MG tablet Take 81 mg by mouth daily.  Marland Kitchen atorvastatin (LIPITOR) 40 MG tablet Take 40 mg by mouth daily.  . carvedilol (COREG) 25 MG tablet Take 1.5 tablets (37.5 mg total) by mouth 2 (two) times daily.  . clobetasol (TEMOVATE) 0.05 % external solution Apply 1 application topically daily as needed (dry scalp).  . clopidogrel (PLAVIX) 75 MG tablet Take 75 mg by mouth daily.   . clotrimazole-betamethasone (LOTRISONE) cream Apply 1 application topically as needed (on affected areas on skin).  . collagenase  (SANTYL) ointment Apply topically daily. (Patient taking differently: Apply 1 application topically daily as needed (wound care). TO AFFECTED SITE(S))  . doxycycline (VIBRA-TABS) 100 MG tablet Take 1 tablet (100 mg total) by mouth 2 (two) times daily.  . fentaNYL (DURAGESIC - DOSED MCG/HR) 50 MCG/HR Place 50 mcg onto the skin every 3 (three) days.  . ferrous sulfate 325 (65 FE) MG tablet Take 325 mg by mouth daily with breakfast.  . fluticasone (CUTIVATE) 0.05 % cream Apply 1 application topically 2 (two) times daily as needed (dry skin on face).   . folic acid (FOLVITE) 193 MCG tablet Take 800 mcg by mouth daily.   . Glucosamine HCl (GLUCOSAMINE PO) Take 2,000 mg by mouth daily.   Marland Kitchen HUMALOG KWIKPEN 100 UNIT/ML KiwkPen Inject 15 Units into the skin 3 (three) times daily with meals. 15 units with breakfast, lunch, & dinner-- pt may increase dose by 1 unit if sugar is elevated  . ketoconazole (NIZORAL) 2 % cream Apply 1 application topically 2 (two) times daily.  Marland Kitchen linaclotide (LINZESS) 145 MCG CAPS capsule Take 145 mcg by mouth daily before breakfast.  . Multiple Vitamin (MULTIVITAMIN) tablet Take 1 tablet by mouth daily.  Marland Kitchen NEEDLE, REUSABLE, 22 G 22G X 1-1/2" MISC 1 Units by Does not apply route as directed. For B12 IM inj  . omega-3 acid ethyl esters (LOVAZA) 1 g capsule Take 2  g by mouth 2 (two) times daily.  . ondansetron (ZOFRAN) 4 MG tablet Take 4 mg by mouth every 6 (six) hours as needed for nausea or vomiting.  Marland Kitchen oxyCODONE-acetaminophen (PERCOCET/ROXICET) 5-325 MG tablet Take 1 tablet by mouth every 8 (eight) hours as needed. (Patient taking differently: Take 1 tablet by mouth 3 (three) times daily. )  . pantoprazole (PROTONIX) 40 MG tablet Take 40 mg by mouth daily.  . pregabalin (LYRICA) 100 MG capsule Take 1 capsule (100 mg total) by mouth 3 (three) times daily.  Marland Kitchen rOPINIRole (REQUIP) 2 MG tablet Take 2 mg by mouth at bedtime.   . Syringe/Needle, Disp, (SYRINGE 3CC/22GX1-1/2") 22G X  1-1/2" 3 ML MISC 1 Syringe by Does not apply route as directed. For b12 IM inj  . tamsulosin (FLOMAX) 0.4 MG CAPS capsule Take 0.4 mg by mouth daily.  Marland Kitchen torsemide (DEMADEX) 20 MG tablet Take 20 mg by mouth daily. May take an additional tablet for increased swelling.   No current facility-administered medications for this visit. (Other)   Facility-Administered Medications Ordered in Other Visits (Other)  Medication Route  . epoetin alfa-epbx (RETACRIT) 38756 UNIT/ML injection       REVIEW OF SYSTEMS: ROS    Positive for: Endocrine   Last edited by Tilda Franco on 01/04/2020  1:43 PM. (History)       ALLERGIES Allergies  Allergen Reactions  . Contrast Media [Iodinated Diagnostic Agents]     Difficulty breathing    . Ivp Dye [Iodinated Diagnostic Agents] Anaphylaxis and Other (See Comments)    Breathing problems   . Adhesive [Tape] Rash    Rash after 2-3 days use    PAST MEDICAL HISTORY Past Medical History:  Diagnosis Date  . Carotid artery occlusion 11/10/10   LEFT CAROTID ENDARTERECTOMY  . Complication of anesthesia    BP WENT UP AT [facility] "  . COPD (chronic obstructive pulmonary disease) ([place])   . Diverticulitis   . Diverticulosis of colon (without mention of hemorrhage)   . DJD (degenerative joint disease)   . Fatty liver   . Full dentures   . GERD (gastroesophageal reflux disease)   . H/O hiatal hernia   . Hyperlipidemia   . Hypertension   . Neuromuscular disorder ([place])    peripheral neuropathy  . Non-pressure chronic ulcer of other part of left foot limited to breakdown of skin ([place]) 11/12/2016  . Osteomyelitis ([place])    left 5th metatarsal  . PAD (peripheral artery disease) ([place])    Distal aortogram June 2012. Atherectomy left popliteal artery July 2012.   . Pseudoclaudication 11/15/2018  . Slurred speech    AS PER WIFE IN D/C NOTE 11/10/10  . 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 Unstable angina ([place]) 09/16/2018  . Wears glasses    Past Surgical History:    Procedure Laterality Date  . AMPUTATION  11/05/2011   Procedure: AMPUTATION RAY;  Surgeon: Wylene Simmer, MD;  Location: [place];  Service: Orthopedics;  Laterality: Right;  Amputation of Right 4&5th Toes  . AMPUTATION Left 11/26/2012   Procedure: AMPUTATION RAY;  Surgeon: Wylene Simmer, MD;  Location: [place];  Service: Orthopedics;  Laterality: Left;  fourth ray amputation  . AMPUTATION Right 08/27/2014   Procedure: Transmetatarsal Amputation;  Surgeon: Newt Minion, MD;  Location: [place];  Service: Orthopedics;  Laterality: Right;  . AMPUTATION Right 01/14/2015   Procedure: AMPUTATION BELOW KNEE;  Surgeon: Newt Minion, MD;  Location: [place];  Service: Orthopedics;  Laterality: Right;  .  AMPUTATION Left 10/21/2015   Procedure: Left Foot 5th Ray Amputation;  Surgeon: Newt Minion, MD;  Location: [place];  Service: Orthopedics;  Laterality: Left;  . ANTERIOR FUSION CERVICAL SPINE  02/06/06   C4-5, C5-6, C6-7; SURGEON DR. MAX COHEN  . BACK SURGERY    . BELOW KNEE LEG AMPUTATION Right   . CARDIAC CATHETERIZATION  10/31/04   2009  . CAROTID ENDARTERECTOMY  11/10/10  . CAROTID ENDARTERECTOMY Left 11/10/2010   Subtotal occlusion of left internal carotid artery with left hemispheric transient ischemic attacks.  . CAROTID STENT    . CARPAL TUNNEL RELEASE Right 10/21/2013   Procedure: RIGHT CARPAL TUNNEL RELEASE;  Surgeon: Wynonia Sours, MD;  Location: [place];  Service: Orthopedics;  Laterality: Right;  . CHOLECYSTECTOMY    . COLON SURGERY    . COLONOSCOPY    . COLOSTOMY REVERSAL  05/21/2018   ileostomy reversal  . CYSTOSCOPY WITH STENT PLACEMENT Bilateral 01/13/2018   Procedure: CYSTOSCOPY WITH BILATERAL URETERAL CATHETER PLACEMENT;  Surgeon: Ardis Hughs, MD;  Location: [facility];  Service: Urology;  Laterality: Bilateral;  . ESOPHAGEAL MANOMETRY Bilateral 07/19/2014   Procedure: ESOPHAGEAL MANOMETRY (EM);  Surgeon: Jerene Bears, MD;  Location[facility];  Service: Gastroenterology;   Laterality: Bilateral;  . FEMORAL ARTERY STENT     x6  . FINGER SURGERY    . FOOT SURGERY  04/25/2016    EXCISION BASE 5TH METATARSAL AND PARTIAL CUBOID LEFT FOOT  . HERNIA REPAIR     LEFT INGUINAL AND UMBILICAL REPAIRS  . HERNIA REPAIR    . I & D EXTREMITY Left 04/25/2016   Procedure: EXCISION BASE 5TH METATARSAL AND PARTIAL CUBOID LEFT FOOT;  Surgeon: Newt Minion, MD;  Location: [place];  Service: Orthopedics;  Laterality: Left;  . ILEOSTOMY  01/13/2018   Procedure: ILEOSTOMY;  Surgeon: Clovis Riley, MD;  Location: [facility];  Service: General;;  . ILEOSTOMY CLOSURE N/A 05/21/2018   Procedure: ILEOSTOMY REVERSAL ERAS PATHWAY;  Surgeon: Clovis Riley, MD;  Location: [facility];  Service: General;  Laterality: N/A;  . IR RADIOLOGIST EVAL & MGMT  11/19/2017  . IR RADIOLOGIST EVAL & MGMT  12/03/2017  . IR RADIOLOGIST EVAL & MGMT  12/18/2017  . JOINT REPLACEMENT Right 2001   Total  . LAMINECTOMY     X 3 LUMBAR AND X 2 CERVICAL SPINE OPERATIONS  . LAPAROSCOPIC CHOLECYSTECTOMY W/ CHOLANGIOGRAPHY  11/09/04   SURGEON DR. Luella Cook  . LEFT HEART CATH AND CORONARY ANGIOGRAPHY N/A 09/16/2018   Procedure: LEFT HEART CATH AND CORONARY ANGIOGRAPHY;  Surgeon: Nigel Mormon, MD;  Location: [place] [facility];  Service: Cardiovascular;  Laterality: N/A;  . LEFT HEART CATHETERIZATION WITH CORONARY ANGIOGRAM N/A 10/29/2014   Procedure: LEFT HEART CATHETERIZATION WITH CORONARY ANGIOGRAM;  Surgeon: Laverda Page, MD;  Location[facility];  Service: Cardiovascular;  Laterality: N/A;  . LOWER EXTREMITY ANGIOGRAM N/A 03/19/2012   Procedure: LOWER EXTREMITY ANGIOGRAM;  Surgeon: Burnell Blanks, MD;  Location: [facility];  Service: Cardiovascular;  Laterality: N/A;  . NECK SURGERY    . PARTIAL COLECTOMY N/A 01/13/2018   Procedure: LAPAROSCOPIC ASSISTED   SIGMOID COLECTOMY ILEOSTOMY;  Surgeon: Clovis Riley, MD;  Location: [facility];  Service: General;  Laterality: N/A;  . PENILE PROSTHESIS  IMPLANT  08/14/05   INFRAPUBIC INSERTION OF INFLATABLE PENILE PROSTHESIS; SURGEON DR. Amalia Hailey  . PENILE PROSTHESIS IMPLANT    . PERCUTANEOUS CORONARY STENT INTERVENTION (  PCI-S) Right 10/29/2014   Procedure: PERCUTANEOUS CORONARY STENT INTERVENTION (PCI-S);  Surgeon: Laverda Page, MD;  Location: [facility] CATH LAB;  Service: Cardiovascular;  Laterality: Right;  . SHOULDER ARTHROSCOPY    . SPINE SURGERY    . TOE AMPUTATION Left   . TONSILLECTOMY    . TOTAL KNEE ARTHROPLASTY  07/2002   RIGHT KNEE ; SURGEON  DR. [name] ALSO HAD ARTHROSCOPIC RIGHT KNEE IN  10/2001  . TOTAL KNEE ARTHROPLASTY    . ULNAR NERVE TRANSPOSITION Right 10/21/2013   Procedure: RIGHT ELBOW  ULNAR NERVE DECOMPRESSION;  Surgeon: Wynonia Sours, MD;  Location: [place];  Service: Orthopedics;  Laterality: Right;    FAMILY HISTORY Family History  Problem Relation Age of Onset  . Heart disease Father        Before age 62-  CAD, BPG  . Diabetes Father        Amputation  . Cancer Father        PROSTATE  . Hyperlipidemia Father   . Hypertension Father   . Heart attack Father        Triple BPG  . Varicose Veins Father   . Colon cancer Brother   . Diabetes Brother   . Heart disease Brother 69       A-Fib. Before age 23  . Hyperlipidemia Brother   . Hypertension Brother   . Cancer Sister        Breast  . Hyperlipidemia Sister   . Hypertension Sister   . Hypertension Son   . Arthritis Other        GRANDMOTHER  . Hypertension Other        OTHER FAMILY MEMBERS    SOCIAL HISTORY Social History   Tobacco Use  . Smoking status: Current Every Day Smoker    Packs/day: 2.00    Years: 35.00    Pack years: 70.00    Types: Cigarettes, E-cigarettes    Last attempt to quit: 10/28/2011    Years since quitting: 8.1  . Smokeless tobacco: Never Used  Substance Use Topics  . Alcohol use: Not Currently    Comment: "not in a long time"  . Drug use: Not Currently         OPHTHALMIC EXAM: Base Eye Exam     Visual Acuity (Snellen - Linear)      Right Left   Dist [place] 20/40 20/20 -1   Dist ph [place] 20/30 -1        Tonometry (Tonopen, 1:49 PM)      Right Left   Pressure 12 10       Pupils      Pupils Dark Light Shape React APD   Right PERRL 2 2 Round Sluggish None   Left PERRL 2 2 Round Sluggish None       Visual Fields (Counting fingers)      Left Right    Full Full       Neuro/Psych    Oriented x3: Yes   Mood/Affect: Normal       Dilation    Left eye: 1.0% Mydriacyl, 2.5% Phenylephrine @ 1:49 PM          IMAGING AND PROCEDURES  Imaging and Procedures for 01/04/20  OCT, Retina - OU - Both Eyes       Right Eye Quality was good. Scan locations included subfoveal. Central Foveal Thickness: 282. Progression has improved. Findings include intraretinal hyper-reflective material, intraretinal fluid, cystoid macular edema.   Left Eye  Central Foveal Thickness: 289. Progression has improved. Findings include intraretinal fluid, intraretinal hyper-reflective material, cystoid macular edema.   Notes OD, much less cystoid macular edema, CSME on Eylea.  Repeat as scheduled examination.  Posterior vitreous detachment confirmed  OS, much less CSME on Avastin.  Posterior vitreous detachment.                ASSESSMENT/PLAN:  Severe nonproliferative diabetic retinopathy of left eye, with macular edema, associated with type 2 diabetes mellitus ([place])  The nature of diabetic macular edema was discussed with the patient. Treatment options were outlined including medical therapy, laser & vitrectomy. The use of injectable medications reviewed, including Avastin, Lucentis, and Eylea. Periodic injections into the eye are likely to resolve diabetic macular edema (swelling in the center of vision). Initially, injections are delivered are delivered every 4-6 weeks, and the interval extended as the condition improves. On average, 8-9 injections the first year, and 5 in year 2. Improvement in  the condition most often improves on medical therapy. Occasional use of focal laser is also recommended for residual macular edema (swelling). Excellent control of blood glucose and blood pressure are encouraged under the care of a primary physician or endocrinologist. Similarly, attempts to maintain serum cholesterol, low density lipoproteins, and high-density lipoproteins in a favorable range were recommended.       ICD-10-CM   1. Severe nonproliferative diabetic retinopathy of left eye, with macular edema, associated with type 2 diabetes mellitus (HCC)  W29.5621 OCT, Retina - OU - Both Eyes    Intravitreal Injection, Pharmacologic Agent - OS - Left Eye  2. Moderate nonproliferative diabetic retinopathy of right eye with macular edema associated with type 2 diabetes mellitus (HCC)  E11.3311 OCT, Retina - OU - Both Eyes  3. Retinal hemorrhage of right eye  H35.61   4. Posterior vitreous detachment of both eyes  H43.813     1.  OS, improved CSME, repeat intravitreal Avastin today  2.  OD, improved CSME, exam soon possible intravitreal Eylea  3.  Ophthalmic Meds Ordered this visit:  No orders of the defined types were placed in this encounter.      Return in about 8 weeks (around 02/29/2020) for AVASTIN OCT, OS.  There are no Patient Instructions on file for this visit.   Explained the diagnoses, plan, and follow up with the patient and they expressed understanding.  Patient expressed understanding of the importance of proper follow up care.   [name] M.D. Diseases & Surgery of the Retina and Vitreous Retina & Diabetic [place] 01/04/20     Abbreviations: M myopia (nearsighted); A astigmatism; H hyperopia (farsighted); P presbyopia; Mrx spectacle prescription;  CTL contact lenses; OD right eye; OS left eye; OU both eyes  XT exotropia; ET esotropia; PEK punctate epithelial keratitis; PEE punctate epithelial erosions; DES dry eye syndrome; MGD meibomian gland dysfunction;  ATs artificial tears; PFAT's preservative free artificial tears; [place] nuclear sclerotic cataract; PSC posterior subcapsular cataract; ERM epi-retinal membrane; PVD posterior vitreous detachment; RD retinal detachment; DM diabetes mellitus; DR diabetic retinopathy; NPDR non-proliferative diabetic retinopathy; PDR proliferative diabetic retinopathy; CSME clinically significant macular edema; DME diabetic macular edema; dbh dot blot hemorrhages; CWS cotton wool spot; POAG primary open angle glaucoma; C/D cup-to-disc ratio; HVF humphrey visual field; GVF goldmann visual field; OCT optical coherence tomography; IOP intraocular pressure; BRVO Branch retinal vein occlusion; CRVO central retinal vein occlusion; CRAO central retinal artery occlusion; BRAO branch retinal artery occlusion; RT retinal tear; SB scleral buckle; PPV  pars plana vitrectomy; VH Vitreous hemorrhage; PRP panretinal laser photocoagulation; IVK intravitreal kenalog; VMT vitreomacular traction; MH Macular hole;  NVD neovascularization of the disc; NVE neovascularization elsewhere; AREDS age related eye disease study; ARMD age related macular degeneration; POAG primary open angle glaucoma; EBMD epithelial/anterior basement membrane dystrophy; ACIOL anterior chamber intraocular lens; IOL intraocular lens; PCIOL posterior chamber intraocular lens; Phaco/IOL phacoemulsification with intraocular lens placement; [place] photorefractive keratectomy; LASIK laser assisted in situ keratomileusis; HTN hypertension; DM diabetes mellitus; COPD chronic obstructive pulmonary disease 3

## 2020-01-04 NOTE — Assessment & Plan Note (Signed)
The nature of diabetic macular edema was discussed with the patient. Treatment options were outlined including medical therapy, laser & vitrectomy. The use of injectable medications reviewed, including Avastin, Lucentis, and Eylea. Periodic injections into the eye are likely to resolve diabetic macular edema (swelling in the center of vision). Initially, injections are delivered are delivered every 4-6 weeks, and the interval extended as the condition improves. On average, 8-9 injections the first year, and 5 in year 2. Improvement in the condition most often improves on medical therapy. Occasional use of focal laser is also recommended for residual macular edema (swelling). Excellent control of blood glucose and blood pressure are encouraged under the care of a primary physician or endocrinologist. Similarly, attempts to maintain serum cholesterol, low density lipoproteins, and high-density lipoproteins in a favorable range were recommended.

## 2020-01-18 ENCOUNTER — Encounter (HOSPITAL_COMMUNITY)
Admission: RE | Admit: 2020-01-18 | Discharge: 2020-01-18 | Disposition: A | Discharge status: Home / Self Care | Payer: 59 | Source: Ambulatory Visit | Attending: Nephrology | Admitting: Nephrology

## 2020-01-18 ENCOUNTER — Encounter (INDEPENDENT_AMBULATORY_CARE_PROVIDER_SITE_OTHER): Payer: Self-pay | Admitting: Ophthalmology

## 2020-01-18 ENCOUNTER — Other Ambulatory Visit: Payer: Self-pay

## 2020-01-18 ENCOUNTER — Ambulatory Visit (INDEPENDENT_AMBULATORY_CARE_PROVIDER_SITE_OTHER): Payer: 59 | Admitting: Ophthalmology

## 2020-01-18 DIAGNOSIS — E113412 Type 2 diabetes mellitus with severe nonproliferative diabetic retinopathy with macular edema, left eye: Secondary | ICD-10-CM

## 2020-01-18 DIAGNOSIS — E113411 Type 2 diabetes mellitus with severe nonproliferative diabetic retinopathy with macular edema, right eye: Secondary | ICD-10-CM | POA: Insufficient documentation

## 2020-01-18 DIAGNOSIS — D631 Anemia in chronic kidney disease: Secondary | ICD-10-CM | POA: Insufficient documentation

## 2020-01-18 DIAGNOSIS — I6523 Occlusion and stenosis of bilateral carotid arteries: Secondary | ICD-10-CM

## 2020-01-18 DIAGNOSIS — N183 Chronic kidney disease, stage 3 unspecified: Secondary | ICD-10-CM | POA: Insufficient documentation

## 2020-01-18 DIAGNOSIS — E113511 Type 2 diabetes mellitus with proliferative diabetic retinopathy with macular edema, right eye: Secondary | ICD-10-CM | POA: Insufficient documentation

## 2020-01-18 LAB — IRON AND TIBC
Iron: 51 ug/dL (ref 45–182)
Saturation Ratios: 20 % (ref 17.9–39.5)
TIBC: 258 ug/dL (ref 250–450)
UIBC: 207 ug/dL

## 2020-01-18 LAB — POCT HEMOGLOBIN-HEMACUE: Hemoglobin: 10.8 g/dL — ABNORMAL LOW (ref 13.0–17.0)

## 2020-01-18 LAB — FERRITIN: Ferritin: 191 ng/mL (ref 24–336)

## 2020-01-18 MED ORDER — EPOETIN ALFA-EPBX 10000 UNIT/ML IJ SOLN
INTRAMUSCULAR | Status: AC
  Filled 2020-01-18: qty 2

## 2020-01-18 MED ORDER — AFLIBERCEPT 2MG/0.05ML IZ SOLN FOR KALEIDOSCOPE
2.0000 mg | INTRAVITREAL | Status: AC | PRN
  Administered 2020-01-18: 2 mg via INTRAVITREAL

## 2020-01-18 MED ORDER — EPOETIN ALFA-EPBX 10000 UNIT/ML IJ SOLN
20000.0000 [IU] | Freq: Once | INTRAMUSCULAR | Status: DC
  Administered 2020-01-18: 20000 [IU] via SUBCUTANEOUS

## 2020-01-18 NOTE — Progress Notes (Signed)
01/18/2020     CHIEF COMPLAINT Patient presents for Retina Follow Up   HISTORY OF PRESENT ILLNESS: [name] is a 64 y.o. male who presents to the clinic today for:   HPI    Retina Follow Up    Patient presents with  Diabetic Retinopathy.  In right eye.  Duration of 8 weeks.  Since onset it is stable.          Comments    8 week follow up - OCT OU, Possible Eylea OD Patient denies change in vision and overall has no complaints. LBS 147 this AM A1C 5.17 November 2019       Last edited by Gerda Diss on 01/18/2020  2:09 PM. (History)      Referring physician: [name], PA-C [place],  [place] [address]  HISTORICAL INFORMATION:   Selected notes from the MEDICAL RECORD NUMBER    Lab Results  Component Value Date   HGBA1C 5.7 (H) 09/16/2018     CURRENT MEDICATIONS: Current Outpatient Medications (Ophthalmic Drugs)  Medication Sig  . Polyvinyl Alcohol-Povidone PF (REFRESH) 1.4-0.6 % SOLN Place 1 drop into both eyes daily.   No current facility-administered medications for this visit. (Ophthalmic Drugs)   Current Outpatient Medications (Other)  Medication Sig  . amLODipine (NORVASC) 10 MG tablet Take 1 tablet (10 mg total) by mouth daily.  Marland Kitchen aspirin EC 81 MG tablet Take 81 mg by mouth daily.  Marland Kitchen atorvastatin (LIPITOR) 40 MG tablet Take 40 mg by mouth daily.  . carvedilol (COREG) 25 MG tablet Take 1.5 tablets (37.5 mg total) by mouth 2 (two) times daily.  . clobetasol (TEMOVATE) 0.05 % external solution Apply 1 application topically daily as needed (dry scalp).  . clopidogrel (PLAVIX) 75 MG tablet Take 75 mg by mouth daily.   . clotrimazole-betamethasone (LOTRISONE) cream Apply 1 application topically as needed (on affected areas on skin).  . collagenase (SANTYL) ointment Apply topically daily. (Patient taking differently: Apply 1 application topically daily as needed (wound care). TO AFFECTED SITE(S))  . doxycycline (VIBRA-TABS) 100 MG tablet Take  1 tablet (100 mg total) by mouth 2 (two) times daily.  . fentaNYL (DURAGESIC - DOSED MCG/HR) 50 MCG/HR Place 50 mcg onto the skin every 3 (three) days.  . ferrous sulfate 325 (65 FE) MG tablet Take 325 mg by mouth daily with breakfast.  . fluticasone (CUTIVATE) 0.05 % cream Apply 1 application topically 2 (two) times daily as needed (dry skin on face).   . folic acid (FOLVITE) 382 MCG tablet Take 800 mcg by mouth daily.   . Glucosamine HCl (GLUCOSAMINE PO) Take 2,000 mg by mouth daily.   Marland Kitchen HUMALOG KWIKPEN 100 UNIT/ML KiwkPen Inject 15 Units into the skin 3 (three) times daily with meals. 15 units with breakfast, lunch, & dinner-- pt may increase dose by 1 unit if sugar is elevated  . ketoconazole (NIZORAL) 2 % cream Apply 1 application topically 2 (two) times daily.  Marland Kitchen linaclotide (LINZESS) 145 MCG CAPS capsule Take 145 mcg by mouth daily before breakfast.  . Multiple Vitamin (MULTIVITAMIN) tablet Take 1 tablet by mouth daily.  Marland Kitchen NEEDLE, REUSABLE, 22 G 22G X 1-1/2" MISC 1 Units by Does not apply route as directed. For B12 IM inj  . omega-3 acid ethyl esters (LOVAZA) 1 g capsule Take 2 g by mouth 2 (two) times daily.  . ondansetron (ZOFRAN) 4 MG tablet Take 4 mg by mouth every 6 (six) hours as needed for  nausea or vomiting.  Marland Kitchen oxyCODONE-acetaminophen (PERCOCET/ROXICET) 5-325 MG tablet Take 1 tablet by mouth every 8 (eight) hours as needed. (Patient taking differently: Take 1 tablet by mouth 3 (three) times daily. )  . pantoprazole (PROTONIX) 40 MG tablet Take 40 mg by mouth daily.  . pregabalin (LYRICA) 100 MG capsule Take 1 capsule (100 mg total) by mouth 3 (three) times daily.  Marland Kitchen rOPINIRole (REQUIP) 2 MG tablet Take 2 mg by mouth at bedtime.   . Syringe/Needle, Disp, (SYRINGE 3CC/22GX1-1/2") 22G X 1-1/2" 3 ML MISC 1 Syringe by Does not apply route as directed. For b12 IM inj  . tamsulosin (FLOMAX) 0.4 MG CAPS capsule Take 0.4 mg by mouth daily.  Marland Kitchen torsemide (DEMADEX) 20 MG tablet Take 20 mg by  mouth daily. May take an additional tablet for increased swelling.   No current facility-administered medications for this visit. (Other)   Facility-Administered Medications Ordered in Other Visits (Other)  Medication Route  . epoetin alfa-epbx (RETACRIT) 97989 UNIT/ML injection       REVIEW OF SYSTEMS:    ALLERGIES Allergies  Allergen Reactions  . Contrast Media [Iodinated Diagnostic Agents]     Difficulty breathing    . Ivp Dye [Iodinated Diagnostic Agents] Anaphylaxis and Other (See Comments)    Breathing problems   . Adhesive [Tape] Rash    Rash after 2-3 days use    PAST MEDICAL HISTORY Past Medical History:  Diagnosis Date  . Carotid artery occlusion 11/10/10   LEFT CAROTID ENDARTERECTOMY  . Complication of anesthesia    BP WENT UP AT [facility] "  . COPD (chronic obstructive pulmonary disease) ([place])   . Diverticulitis   . Diverticulosis of colon (without mention of hemorrhage)   . DJD (degenerative joint disease)   . Fatty liver   . Full dentures   . GERD (gastroesophageal reflux disease)   . H/O hiatal hernia   . Hyperlipidemia   . Hypertension   . Neuromuscular disorder ([place])    peripheral neuropathy  . Non-pressure chronic ulcer of other part of left foot limited to breakdown of skin ([place]) 11/12/2016  . Osteomyelitis ([place])    left 5th metatarsal  . PAD (peripheral artery disease) ([place])    Distal aortogram June 2012. Atherectomy left popliteal artery July 2012.   . Pseudoclaudication 11/15/2018  . Slurred speech    AS PER WIFE IN D/C NOTE 11/10/10  . 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 Unstable angina ([place]) 09/16/2018  . Wears glasses    Past Surgical History:  Procedure Laterality Date  . AMPUTATION  11/05/2011   Procedure: AMPUTATION RAY;  Surgeon: Wylene Simmer, MD;  Location: [facility];  Service: Orthopedics;  Laterality: Right;  Amputation of Right 4&5th Toes  . AMPUTATION Left 11/26/2012   Procedure: AMPUTATION RAY;  Surgeon: Wylene Simmer, MD;  Location: [facility];   Service: Orthopedics;  Laterality: Left;  fourth ray amputation  . AMPUTATION Right 08/27/2014   Procedure: Transmetatarsal Amputation;  Surgeon: Newt Minion, MD;  Location: [facility];  Service: Orthopedics;  Laterality: Right;  . AMPUTATION Right 01/14/2015   Procedure: AMPUTATION BELOW KNEE;  Surgeon: Newt Minion, MD;  Location: [facility];  Service: Orthopedics;  Laterality: Right;  . AMPUTATION Left 10/21/2015   Procedure: Left Foot 5th Ray Amputation;  Surgeon: Newt Minion, MD;  Location: [facility];  Service: Orthopedics;  Laterality: Left;  . ANTERIOR FUSION CERVICAL SPINE  02/06/06   C4-5, C5-6, C6-7; SURGEON DR. MAX COHEN  . BACK SURGERY    .  BELOW KNEE LEG AMPUTATION Right   . CARDIAC CATHETERIZATION  10/31/04   2009  . CAROTID ENDARTERECTOMY  11/10/10  . CAROTID ENDARTERECTOMY Left 11/10/2010   Subtotal occlusion of left internal carotid artery with left hemispheric transient ischemic attacks.  . CAROTID STENT    . CARPAL TUNNEL RELEASE Right 10/21/2013   Procedure: RIGHT CARPAL TUNNEL RELEASE;  Surgeon: Wynonia Sours, MD;  Location: [facility];  Service: Orthopedics;  Laterality: Right;  . CHOLECYSTECTOMY    . COLON SURGERY    . COLONOSCOPY    . COLOSTOMY REVERSAL  05/21/2018   ileostomy reversal  . CYSTOSCOPY WITH STENT PLACEMENT Bilateral 01/13/2018   Procedure: CYSTOSCOPY WITH BILATERAL URETERAL CATHETER PLACEMENT;  Surgeon: Ardis Hughs, MD;  Location: [facility];  Service: Urology;  Laterality: Bilateral;  . ESOPHAGEAL MANOMETRY Bilateral 07/19/2014   Procedure: ESOPHAGEAL MANOMETRY (EM);  Surgeon: Jerene Bears, MD;  [facility];  Service: Gastroenterology;  Laterality: Bilateral;  . FEMORAL ARTERY STENT     x6  . FINGER SURGERY    . FOOT SURGERY  04/25/2016    EXCISION BASE 5TH METATARSAL AND PARTIAL CUBOID LEFT FOOT  . HERNIA REPAIR     LEFT INGUINAL AND UMBILICAL REPAIRS  . HERNIA REPAIR    . I & D EXTREMITY Left 04/25/2016   Procedure: EXCISION BASE 5TH  METATARSAL AND PARTIAL CUBOID LEFT FOOT;  Surgeon: Newt Minion, MD;  Location: [facility];  Service: Orthopedics;  Laterality: Left;  . ILEOSTOMY  01/13/2018   Procedure: ILEOSTOMY;  Surgeon: Clovis Riley, MD;  [facility];  Service: General;;  . ILEOSTOMY CLOSURE N/A 05/21/2018   Procedure: ILEOSTOMY REVERSAL ERAS PATHWAY;  Surgeon: Clovis Riley, MD;  Location: [facility];  Service: General;  Laterality: N/A;  . IR RADIOLOGIST EVAL & MGMT  11/19/2017  . IR RADIOLOGIST EVAL & MGMT  12/03/2017  . IR RADIOLOGIST EVAL & MGMT  12/18/2017  . JOINT REPLACEMENT Right 2001   Total  . LAMINECTOMY     X 3 LUMBAR AND X 2 CERVICAL SPINE OPERATIONS  . LAPAROSCOPIC CHOLECYSTECTOMY W/ CHOLANGIOGRAPHY  11/09/04   SURGEON DR. Luella Cook  . LEFT HEART CATH AND CORONARY ANGIOGRAPHY N/A 09/16/2018   Procedure: LEFT HEART CATH AND CORONARY ANGIOGRAPHY;  Surgeon: Nigel Mormon, MD;  Location: [facility];  Service: Cardiovascular;  Laterality: N/A;  . LEFT HEART CATHETERIZATION WITH CORONARY ANGIOGRAM N/A 10/29/2014   Procedure: LEFT HEART CATHETERIZATION WITH CORONARY ANGIOGRAM;  Surgeon: Laverda Page, MD;  Location: [facility];  Service: Cardiovascular;  Laterality: N/A;  . LOWER EXTREMITY ANGIOGRAM N/A 03/19/2012   Procedure: LOWER EXTREMITY ANGIOGRAM;  Surgeon: Burnell Blanks, MD;  Location[facility];  Service: Cardiovascular;  Laterality: N/A;  . NECK SURGERY    . PARTIAL COLECTOMY N/A 01/13/2018   Procedure: LAPAROSCOPIC ASSISTED   SIGMOID COLECTOMY ILEOSTOMY;  Surgeon: Clovis Riley, MD;  Location[facility];  Service: General;  Laterality: N/A;  . PENILE PROSTHESIS IMPLANT  08/14/05   INFRAPUBIC INSERTION OF INFLATABLE PENILE PROSTHESIS; SURGEON DR. Amalia Hailey  . PENILE PROSTHESIS IMPLANT    . PERCUTANEOUS CORONARY STENT INTERVENTION (PCI-S) Right 10/29/2014   Procedure: PERCUTANEOUS CORONARY STENT INTERVENTION (PCI-S);  Surgeon: Laverda Page, MD;  Location: [facility] CATH LAB;   Service: Cardiovascular;  Laterality: Right;  . SHOULDER ARTHROSCOPY    . SPINE SURGERY    . TOE AMPUTATION Left   . TONSILLECTOMY    .  TOTAL KNEE ARTHROPLASTY  07/2002   RIGHT KNEE ; SURGEON  DR. [name] ALSO HAD ARTHROSCOPIC RIGHT KNEE IN  10/2001  . TOTAL KNEE ARTHROPLASTY    . ULNAR NERVE TRANSPOSITION Right 10/21/2013   Procedure: RIGHT ELBOW  ULNAR NERVE DECOMPRESSION;  Surgeon: Wynonia Sours, MD;  Location: [facility];  Service: Orthopedics;  Laterality: Right;    FAMILY HISTORY Family History  Problem Relation Age of Onset  . Heart disease Father        Before age 50-  CAD, BPG  . Diabetes Father        Amputation  . Cancer Father        PROSTATE  . Hyperlipidemia Father   . Hypertension Father   . Heart attack Father        Triple BPG  . Varicose Veins Father   . Colon cancer Brother   . Diabetes Brother   . Heart disease Brother 61       A-Fib. Before age 78  . Hyperlipidemia Brother   . Hypertension Brother   . Cancer Sister        Breast  . Hyperlipidemia Sister   . Hypertension Sister   . Hypertension Son   . Arthritis Other        GRANDMOTHER  . Hypertension Other        OTHER FAMILY MEMBERS    SOCIAL HISTORY Social History   Tobacco Use  . Smoking status: Current Every Day Smoker    Packs/day: 2.00    Years: 35.00    Pack years: 70.00    Types: Cigarettes, E-cigarettes    Last attempt to quit: 10/28/2011    Years since quitting: 8.2  . Smokeless tobacco: Never Used  Substance Use Topics  . Alcohol use: Not Currently    Comment: "not in a long time"  . Drug use: Not Currently         OPHTHALMIC EXAM: Base Eye Exam    Visual Acuity (Snellen - Linear)      Right Left   Dist [place] 20/60+1 20/25+2   Dist ph [place] 20/50-1        Tonometry (Tonopen, 2:15 PM)      Right Left   Pressure 11 9       Pupils      Pupils   Right PERRL   Left PERRL       Visual Fields (Counting fingers)      Left Right    Full Full        Extraocular Movement      Right Left    Full Full       Neuro/Psych    Oriented x3: Yes   Mood/Affect: Normal       Dilation    Right eye: 1.0% Mydriacyl, 2.5% Phenylephrine @ 2:16 PM        Slit Lamp and Fundus Exam    External Exam      Right Left   External Normal Normal       Slit Lamp Exam      Right Left   Lids/Lashes Normal Normal   Conjunctiva/Sclera White and quiet White and quiet   Cornea Clear Clear   Anterior Chamber Deep and quiet Deep and quiet   Iris Round and reactive Round and reactive   Lens Posterior chamber intraocular lens Posterior chamber intraocular lens   Vitreous Normal Normal          IMAGING AND PROCEDURES  Imaging and Procedures for 01/18/20  Ferritin     Component Value Flag Ref Range Units Status   Ferritin 191      24 - 336 ng/mL Final   Comment:   Performed at [facility], [place] [address], [place], [place] [address]        Iron and TIBC     Component Value Flag Ref Range Units Status   Iron 51      45 - 182 ug/dL Final   TIBC 258      250 - 450 ug/dL Final   Saturation Ratios 20      17.9 - 39.5 % Final   UIBC 207       ug/dL Final   Comment:   Performed at [facility], [place] [address] [place], [place] [address]        OCT, Retina - OU - Both Eyes       Right Eye Quality was good. Scan locations included subfoveal.   Left Eye Quality was good. Scan locations included subfoveal.                 ASSESSMENT/PLAN:  No problem-specific Assessment & Plan notes found for this encounter.    No diagnosis found.  1.  2.  3.  Ophthalmic Meds Ordered this visit:  No orders of the defined types were placed in this encounter.      No follow-ups on file.  There are no Patient Instructions on file for this visit.   Explained the diagnoses, plan, and follow up with the patient and they expressed understanding.  Patient expressed understanding of the importance of proper follow up care.    Clent Demark Zarius Furr M.D. Diseases & Surgery of the Retina and Vitreous Retina & Diabetic [place] 01/18/20     Abbreviations: M myopia (nearsighted); A astigmatism; H hyperopia (farsighted); P presbyopia; Mrx spectacle prescription;  CTL contact lenses; OD right eye; OS left eye; OU both eyes  XT exotropia; ET esotropia; PEK punctate epithelial keratitis; PEE punctate epithelial erosions; DES dry eye syndrome; MGD meibomian gland dysfunction; ATs artificial tears; PFAT's preservative free artificial tears; [place] nuclear sclerotic cataract; PSC posterior subcapsular cataract; ERM epi-retinal membrane; PVD posterior vitreous detachment; RD retinal detachment; DM diabetes mellitus; DR diabetic retinopathy; NPDR non-proliferative diabetic retinopathy; PDR proliferative diabetic retinopathy; CSME clinically significant macular edema; DME diabetic macular edema; dbh dot blot hemorrhages; CWS cotton wool spot; POAG primary open angle glaucoma; C/D cup-to-disc ratio; HVF humphrey visual field; GVF goldmann visual field; OCT optical coherence tomography; IOP intraocular pressure; BRVO Branch retinal vein occlusion; CRVO central retinal vein occlusion; CRAO central retinal artery occlusion; BRAO branch retinal artery occlusion; RT retinal tear; SB scleral buckle; PPV pars plana vitrectomy; VH Vitreous hemorrhage; PRP panretinal laser photocoagulation; IVK intravitreal kenalog; VMT vitreomacular traction; MH Macular hole;  NVD neovascularization of the disc; NVE neovascularization elsewhere; AREDS age related eye disease study; ARMD age related macular degeneration; POAG primary open angle glaucoma; EBMD epithelial/anterior basement membrane dystrophy; ACIOL anterior chamber intraocular lens; IOL intraocular lens; PCIOL posterior chamber intraocular lens; Phaco/IOL phacoemulsification with intraocular lens placement; [place] photorefractive keratectomy; LASIK laser assisted in situ keratomileusis; HTN hypertension; DM  diabetes mellitus; COPD chronic obstructive pulmonary disease

## 2020-01-18 NOTE — Patient Instructions (Signed)
Diabetic Retinopathy Diabetic retinopathy is a disease of the retina. The retina is a light-sensitive membrane at the back of the eye. Retinopathy is a complication of diabetes (diabetes mellitus) and a common cause of bad eyesight (visual impairment). It can eventually cause blindness. Early detection and treatment of diabetic retinopathy is important in keeping your eyes healthy and preventing further damage to them. What are the causes? Diabetic retinopathy is caused by blood sugar (glucose) levels that are too high for an extended period of time. High blood glucose over an extended period of time can:  Damage small blood vessels in the retina, allowing blood to leak through the vessel walls.  Cause new, abnormal blood vessels to grow on the retina. This can scar the retina in the advanced stage of diabetic retinopathy. What increases the risk? You are more likely to develop this condition if:  You have had diabetes for a long time.  You have poorly controlled blood glucose.  You have high blood pressure. What are the signs or symptoms? In the early stages of diabetic retinopathy, there are often no symptoms. As the condition gets worse, symptoms may include:  Blurred vision. This is usually caused by swelling due to abnormal blood glucose levels. The blurriness may go away when blood glucose levels return to normal.  Moving specks or dark spots (floaters) in your vision. These can be caused by a small amount of bleeding (hemorrhage) from retinal blood vessels.  Missing parts of your field of vision, such as vision at the sides of the eyes. This can be caused by larger retinal hemorrhages.  Difficulty reading.  Double vision.  Pain in one or both eyes.  Feeling pressure in one or both eyes.  Trouble seeing straight lines. Straight lines may not look straight.  Redness of the eyes that does not go away. How is this diagnosed? This condition may be diagnosed with an eye exam in  which your eye care specialist puts drops in your eyes that enlarge (dilate) your pupils. This lets your health care provider examine your retina and check for changes in your retinal blood vessels. How is this treated? This condition may be treated by:  Keeping your blood glucose and blood pressure within a target range.  Using a type of laser beam to seal your retinal blood vessels. This stops them from bleeding and decreases pressure in your eye.  Getting shots of medicine in the eye to reduce swelling of the center of the retina (macula). You may be given: ? Anti-VEGF medicine. This medicine can help slow vision loss, and may even improve vision. ? Steroid medicine. Follow these instructions at home:   Follow your diabetes management plan as directed by your health care provider. This may include exercising regularly and eating a healthy diet.  Keep your blood glucose level and your blood pressure in your target range, as directed by your health care provider.  Check your blood glucose as often as directed.  Take over the counter and prescription medicines only as told by your health care provider. This includes insulin and oral diabetes medicine.  Get your eyes checked at least once every year. An eye specialist can usually see diabetic retinopathy developing long before it starts to cause problems. In many cases, it can be treated to prevent complications from occurring.  Do not use any products that contain nicotine or tobacco, such as cigarettes and e-cigarettes. If you need help quitting, ask your health care provider.  Keep all follow-up   visits as told by your health care provider. This is important. Contact a health care provider if:  You notice gradual blurring or other changes in your vision over time.  You notice that your glasses or contact lenses do not make things look as sharp as they once did.  You have trouble reading or seeing details at a distance with either  eye.  You notice a change in your vision or notice that parts of your field of vision appear missing or hazy.  You suddenly see moving specks or dark spots in the field of vision of either eye. Get help right away if:  You have sudden pain or pressure in one or both eyes.  You suddenly lose vision or a curtain or veil seems to come across your eyes.  You have a sudden burst of floaters in your vision. Summary  Diabetic retinopathy is a disease of the retina. The retina is a light-sensitive membrane at the back of the eye. Retinopathy is a complication of diabetes.  Get your eyes checked at least once every year. An eye specialist can usually see diabetic retinopathy developing long before it starts to cause problems. In many cases, it can be treated to prevent complications from occurring.  Keep your blood glucose and your blood pressure in target range. Follow your diabetes management plan as directed by your health care provider.  Protect your eyes. Wear sunglasses and eye protection when needed. This information is not intended to replace advice given to you by your health care provider. Make sure you discuss any questions you have with your health care provider. Document Revised: 10/09/2017 Document Reviewed: 10/01/2016 Elsevier Patient Education  2020 [employer]  

## 2020-02-01 ENCOUNTER — Other Ambulatory Visit: Payer: Self-pay

## 2020-02-01 ENCOUNTER — Encounter (HOSPITAL_COMMUNITY)
Admission: RE | Admit: 2020-02-01 | Discharge: 2020-02-01 | Disposition: A | Discharge status: Home / Self Care | Payer: 59 | Source: Ambulatory Visit | Attending: Nephrology | Admitting: Nephrology

## 2020-02-01 DIAGNOSIS — N183 Chronic kidney disease, stage 3 unspecified: Secondary | ICD-10-CM | POA: Diagnosis not present

## 2020-02-01 LAB — POCT HEMOGLOBIN-HEMACUE: Hemoglobin: 11.5 g/dL — ABNORMAL LOW (ref 13.0–17.0)

## 2020-02-01 MED ORDER — EPOETIN ALFA-EPBX 10000 UNIT/ML IJ SOLN
INTRAMUSCULAR | Status: AC
  Administered 2020-02-01: 20000 [IU]
  Filled 2020-02-01: qty 2

## 2020-02-01 MED ORDER — EPOETIN ALFA-EPBX 10000 UNIT/ML IJ SOLN: 20000.0000 [IU] | Freq: Once | INTRAMUSCULAR | Status: DC

## 2020-02-15 ENCOUNTER — Inpatient Hospital Stay (HOSPITAL_COMMUNITY): Admission: RE | Admit: 2020-02-15 | Payer: 59 | Source: Ambulatory Visit

## 2020-02-18 ENCOUNTER — Other Ambulatory Visit: Payer: Self-pay

## 2020-02-18 ENCOUNTER — Ambulatory Visit (HOSPITAL_COMMUNITY)
Admission: RE | Admit: 2020-02-18 | Discharge: 2020-02-18 | Disposition: A | Discharge status: Home / Self Care | Payer: 59 | Source: Ambulatory Visit | Attending: Nephrology | Admitting: Nephrology

## 2020-02-18 DIAGNOSIS — D631 Anemia in chronic kidney disease: Secondary | ICD-10-CM | POA: Diagnosis not present

## 2020-02-18 DIAGNOSIS — N183 Chronic kidney disease, stage 3 unspecified: Secondary | ICD-10-CM | POA: Insufficient documentation

## 2020-02-18 LAB — IRON AND TIBC
Iron: 82 ug/dL (ref 45–182)
Saturation Ratios: 31 % (ref 17.9–39.5)
TIBC: 262 ug/dL (ref 250–450)
UIBC: 180 ug/dL

## 2020-02-18 LAB — POCT HEMOGLOBIN-HEMACUE: Hemoglobin: 10.9 g/dL — ABNORMAL LOW (ref 13.0–17.0)

## 2020-02-18 LAB — FERRITIN: Ferritin: 256 ng/mL (ref 24–336)

## 2020-02-18 MED ORDER — EPOETIN ALFA-EPBX 10000 UNIT/ML IJ SOLN
20000.0000 [IU] | Freq: Once | INTRAMUSCULAR | Status: AC
  Administered 2020-02-18: 20000 [IU] via SUBCUTANEOUS

## 2020-02-18 MED ORDER — EPOETIN ALFA-EPBX 10000 UNIT/ML IJ SOLN
INTRAMUSCULAR | Status: AC
  Filled 2020-02-18: qty 2

## 2020-02-29 ENCOUNTER — Ambulatory Visit (INDEPENDENT_AMBULATORY_CARE_PROVIDER_SITE_OTHER): Payer: 59 | Admitting: Ophthalmology

## 2020-02-29 ENCOUNTER — Other Ambulatory Visit: Payer: Self-pay

## 2020-02-29 ENCOUNTER — Encounter (HOSPITAL_COMMUNITY): Payer: 59

## 2020-02-29 ENCOUNTER — Encounter (INDEPENDENT_AMBULATORY_CARE_PROVIDER_SITE_OTHER): Payer: Self-pay | Admitting: Ophthalmology

## 2020-02-29 DIAGNOSIS — E113412 Type 2 diabetes mellitus with severe nonproliferative diabetic retinopathy with macular edema, left eye: Secondary | ICD-10-CM

## 2020-02-29 DIAGNOSIS — E113411 Type 2 diabetes mellitus with severe nonproliferative diabetic retinopathy with macular edema, right eye: Secondary | ICD-10-CM

## 2020-02-29 DIAGNOSIS — I6523 Occlusion and stenosis of bilateral carotid arteries: Secondary | ICD-10-CM

## 2020-02-29 MED ORDER — BEVACIZUMAB CHEMO INJECTION 1.25MG/0.05ML SYRINGE FOR KALEIDOSCOPE
1.2500 mg | INTRAVITREAL | Status: AC | PRN
  Administered 2020-02-29: 1.25 mg via INTRAVITREAL

## 2020-02-29 NOTE — Assessment & Plan Note (Signed)
CSME OD, slightly worse at this interval.  Will repeat examination as scheduled in 1 week.  Status post recent injection OD and will examine at 7-week interval OD next week

## 2020-02-29 NOTE — Assessment & Plan Note (Signed)
CSME, much less active on examination in 6-week interval.  We will repeat intravitreal Avastin OS today and examination in 7 weeks

## 2020-02-29 NOTE — Progress Notes (Signed)
02/29/2020     CHIEF COMPLAINT Patient presents for Retina Follow Up   HISTORY OF PRESENT ILLNESS: [name] is a 64 y.o. male who presents to the clinic today for:   HPI    Retina Follow Up    Patient presents with  Diabetic Retinopathy.  In left eye.  This started 8 weeks ago.  Severity is mild.  Duration of 8 weeks.  Since onset it is stable.          Comments    8 Week NPDR with diabetic macular edema.  F/U OS, poss Avastin OS  Pt denies noticeable changes to [place] OU since last visit. Pt denies ocular pain, flashes of light, or floaters OU.         Last edited by Hurman Horn, MD on 02/29/2020  2:04 PM. (History)      Referring physician: [name], PA-C [place],  [place] [address]  HISTORICAL INFORMATION:   Selected notes from the MEDICAL RECORD NUMBER    Lab Results  Component Value Date   HGBA1C 5.7 (H) 09/16/2018     CURRENT MEDICATIONS: Current Outpatient Medications (Ophthalmic Drugs)  Medication Sig  . Polyvinyl Alcohol-Povidone PF (REFRESH) 1.4-0.6 % SOLN Place 1 drop into both eyes daily.   No current facility-administered medications for this visit. (Ophthalmic Drugs)   Current Outpatient Medications (Other)  Medication Sig  . amLODipine (NORVASC) 10 MG tablet Take 1 tablet (10 mg total) by mouth daily.  Marland Kitchen aspirin EC 81 MG tablet Take 81 mg by mouth daily.  Marland Kitchen atorvastatin (LIPITOR) 40 MG tablet Take 40 mg by mouth daily.  . carvedilol (COREG) 25 MG tablet Take 1.5 tablets (37.5 mg total) by mouth 2 (two) times daily.  . clobetasol (TEMOVATE) 0.05 % external solution Apply 1 application topically daily as needed (dry scalp).  . clopidogrel (PLAVIX) 75 MG tablet Take 75 mg by mouth daily.   . clotrimazole-betamethasone (LOTRISONE) cream Apply 1 application topically as needed (on affected areas on skin).  . collagenase (SANTYL) ointment Apply topically daily. (Patient taking differently: Apply 1 application topically daily as  needed (wound care). TO AFFECTED SITE(S))  . doxycycline (VIBRA-TABS) 100 MG tablet Take 1 tablet (100 mg total) by mouth 2 (two) times daily.  . fentaNYL (DURAGESIC - DOSED MCG/HR) 50 MCG/HR Place 50 mcg onto the skin every 3 (three) days.  . ferrous sulfate 325 (65 FE) MG tablet Take 325 mg by mouth daily with breakfast.  . fluticasone (CUTIVATE) 0.05 % cream Apply 1 application topically 2 (two) times daily as needed (dry skin on face).   . folic acid (FOLVITE) 315 MCG tablet Take 800 mcg by mouth daily.   . Glucosamine HCl (GLUCOSAMINE PO) Take 2,000 mg by mouth daily.   Marland Kitchen HUMALOG KWIKPEN 100 UNIT/ML KiwkPen Inject 15 Units into the skin 3 (three) times daily with meals. 15 units with breakfast, lunch, & dinner-- pt may increase dose by 1 unit if sugar is elevated  . ketoconazole (NIZORAL) 2 % cream Apply 1 application topically 2 (two) times daily.  Marland Kitchen linaclotide (LINZESS) 145 MCG CAPS capsule Take 145 mcg by mouth daily before breakfast.  . Multiple Vitamin (MULTIVITAMIN) tablet Take 1 tablet by mouth daily.  Marland Kitchen NEEDLE, REUSABLE, 22 G 22G X 1-1/2" MISC 1 Units by Does not apply route as directed. For B12 IM inj  . omega-3 acid ethyl esters (LOVAZA) 1 g capsule Take 2 g by mouth 2 (two) times  daily.  . ondansetron (ZOFRAN) 4 MG tablet Take 4 mg by mouth every 6 (six) hours as needed for nausea or vomiting.  Marland Kitchen oxyCODONE-acetaminophen (PERCOCET/ROXICET) 5-325 MG tablet Take 1 tablet by mouth every 8 (eight) hours as needed. (Patient taking differently: Take 1 tablet by mouth 3 (three) times daily. )  . pantoprazole (PROTONIX) 40 MG tablet Take 40 mg by mouth daily.  . pregabalin (LYRICA) 100 MG capsule Take 1 capsule (100 mg total) by mouth 3 (three) times daily.  Marland Kitchen rOPINIRole (REQUIP) 2 MG tablet Take 2 mg by mouth at bedtime.   . Syringe/Needle, Disp, (SYRINGE 3CC/22GX1-1/2") 22G X 1-1/2" 3 ML MISC 1 Syringe by Does not apply route as directed. For b12 IM inj  . tamsulosin (FLOMAX) 0.4 MG CAPS  capsule Take 0.4 mg by mouth daily.  Marland Kitchen torsemide (DEMADEX) 20 MG tablet Take 20 mg by mouth daily. May take an additional tablet for increased swelling.   No current facility-administered medications for this visit. (Other)      REVIEW OF SYSTEMS:    ALLERGIES Allergies  Allergen Reactions  . Contrast Media [Iodinated Diagnostic Agents]     Difficulty breathing    . Ivp Dye [Iodinated Diagnostic Agents] Anaphylaxis and Other (See Comments)    Breathing problems   . Adhesive [Tape] Rash    Rash after 2-3 days use    PAST MEDICAL HISTORY Past Medical History:  Diagnosis Date  . Carotid artery occlusion 11/10/10   LEFT CAROTID ENDARTERECTOMY  . Complication of anesthesia    BP WENT UP AT [facility] "  . COPD (chronic obstructive pulmonary disease) ([place])   . Diverticulitis   . Diverticulosis of colon (without mention of hemorrhage)   . DJD (degenerative joint disease)   . Fatty liver   . Full dentures   . GERD (gastroesophageal reflux disease)   . H/O hiatal hernia   . Hyperlipidemia   . Hypertension   . Neuromuscular disorder ([place])    peripheral neuropathy  . Non-pressure chronic ulcer of other part of left foot limited to breakdown of skin ([place]) 11/12/2016  . Osteomyelitis ([place])    left 5th metatarsal  . PAD (peripheral artery disease) ([place])    Distal aortogram June 2012. Atherectomy left popliteal artery July 2012.   . Pseudoclaudication 11/15/2018  . Slurred speech    AS PER WIFE IN D/C NOTE 11/10/10  . 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 Unstable angina ([place]) 09/16/2018  . Wears glasses    Past Surgical History:  Procedure Laterality Date  . AMPUTATION  11/05/2011   Procedure: AMPUTATION RAY;  Surgeon: Wylene Simmer, MD;  Location: [facility];  Service: Orthopedics;  Laterality: Right;  Amputation of Right 4&5th Toes  . AMPUTATION Left 11/26/2012   Procedure: AMPUTATION RAY;  Surgeon: Wylene Simmer, MD;  Location: [place];  Service: Orthopedics;  Laterality: Left;  fourth ray amputation   . AMPUTATION Right 08/27/2014   Procedure: Transmetatarsal Amputation;  Surgeon: Newt Minion, MD;  Location: [facility];  Service: Orthopedics;  Laterality: Right;  . AMPUTATION Right 01/14/2015   Procedure: AMPUTATION BELOW KNEE;  Surgeon: Newt Minion, MD;  Location: [facility];  Service: Orthopedics;  Laterality: Right;  . AMPUTATION Left 10/21/2015   Procedure: Left Foot 5th Ray Amputation;  Surgeon: Newt Minion, MD;  Location: [facility];  Service: Orthopedics;  Laterality: Left;  . ANTERIOR FUSION CERVICAL SPINE  02/06/06   C4-5, C5-6, C6-7; SURGEON DR. MAX COHEN  . BACK SURGERY    .  BELOW KNEE LEG AMPUTATION Right   . CARDIAC CATHETERIZATION  10/31/04   2009  . CAROTID ENDARTERECTOMY  11/10/10  . CAROTID ENDARTERECTOMY Left 11/10/2010   Subtotal occlusion of left internal carotid artery with left hemispheric transient ischemic attacks.  . CAROTID STENT    . CARPAL TUNNEL RELEASE Right 10/21/2013   Procedure: RIGHT CARPAL TUNNEL RELEASE;  Surgeon: Wynonia Sours, MD;  Location: [facility];  Service: Orthopedics;  Laterality: Right;  . CHOLECYSTECTOMY    . COLON SURGERY    . COLONOSCOPY    . COLOSTOMY REVERSAL  05/21/2018   ileostomy reversal  . CYSTOSCOPY WITH STENT PLACEMENT Bilateral 01/13/2018   Procedure: CYSTOSCOPY WITH BILATERAL URETERAL CATHETER PLACEMENT;  Surgeon: Ardis Hughs, MD;  Location: [facility];  Service: Urology;  Laterality: Bilateral;  . ESOPHAGEAL MANOMETRY Bilateral 07/19/2014   Procedure: ESOPHAGEAL MANOMETRY (EM);  Surgeon: Jerene Bears, MD;  [facility];  Service: Gastroenterology;  Laterality: Bilateral;  . FEMORAL ARTERY STENT     x6  . FINGER SURGERY    . FOOT SURGERY  04/25/2016    EXCISION BASE 5TH METATARSAL AND PARTIAL CUBOID LEFT FOOT  . HERNIA REPAIR     LEFT INGUINAL AND UMBILICAL REPAIRS  . HERNIA REPAIR    . I & D EXTREMITY Left 04/25/2016   Procedure: EXCISION BASE 5TH METATARSAL AND PARTIAL CUBOID LEFT FOOT;  Surgeon: Newt Minion, MD;  Location: [facility];  Service: Orthopedics;  Laterality: Left;  . ILEOSTOMY  01/13/2018   Procedure: ILEOSTOMY;  Surgeon: Clovis Riley, MD;  Location[facility];  Service: General;;  . ILEOSTOMY CLOSURE N/A 05/21/2018   Procedure: ILEOSTOMY REVERSAL ERAS PATHWAY;  Surgeon: Clovis Riley, MD;  Location: [facility];  Service: General;  Laterality: N/A;  . IR RADIOLOGIST EVAL & MGMT  11/19/2017  . IR RADIOLOGIST EVAL & MGMT  12/03/2017  . IR RADIOLOGIST EVAL & MGMT  12/18/2017  . JOINT REPLACEMENT Right 2001   Total  . LAMINECTOMY     X 3 LUMBAR AND X 2 CERVICAL SPINE OPERATIONS  . LAPAROSCOPIC CHOLECYSTECTOMY W/ CHOLANGIOGRAPHY  11/09/04   SURGEON DR. Luella Cook  . LEFT HEART CATH AND CORONARY ANGIOGRAPHY N/A 09/16/2018   Procedure: LEFT HEART CATH AND CORONARY ANGIOGRAPHY;  Surgeon: Nigel Mormon, MD;  Location: [facility];  Service: Cardiovascular;  Laterality: N/A;  . LEFT HEART CATHETERIZATION WITH CORONARY ANGIOGRAM N/A 10/29/2014   Procedure: LEFT HEART CATHETERIZATION WITH CORONARY ANGIOGRAM;  Surgeon: Laverda Page, MD;  [facility];  Service: Cardiovascular;  Laterality: N/A;  . LOWER EXTREMITY ANGIOGRAM N/A 03/19/2012   Procedure: LOWER EXTREMITY ANGIOGRAM;  Surgeon: Burnell Blanks, MD;  Location: [facility] C[facility] LAB;  Service: Cardiovascular;  Laterality: N/A;  . NECK SURGERY    . PARTIAL COLECTOMY N/A 01/13/2018   Procedure: LAPAROSCOPIC ASSISTED   SIGMOID COLECTOMY ILEOSTOMY;  Surgeon: Clovis Riley, MD;  Location[facility];  Service: General;  Laterality: N/A;  . PENILE PROSTHESIS IMPLANT  08/14/05   INFRAPUBIC INSERTION OF INFLATABLE PENILE PROSTHESIS; SURGEON DR. Amalia Hailey  . PENILE PROSTHESIS IMPLANT    . PERCUTANEOUS CORONARY STENT INTERVENTION (PCI-S) Right 10/29/2014   Procedure: PERCUTANEOUS CORONARY STENT INTERVENTION (PCI-S);  Surgeon: Laverda Page, MD;  Location: [facility] CATH LAB;  Service: Cardiovascular;  Laterality: Right;  . SHOULDER  ARTHROSCOPY    . SPINE SURGERY    . TOE AMPUTATION Left   . TONSILLECTOMY    .  TOTAL KNEE ARTHROPLASTY  07/2002   RIGHT KNEE ; SURGEON  DR. [name] ALSO HAD ARTHROSCOPIC RIGHT KNEE IN  10/2001  . TOTAL KNEE ARTHROPLASTY    . ULNAR NERVE TRANSPOSITION Right 10/21/2013   Procedure: RIGHT ELBOW  ULNAR NERVE DECOMPRESSION;  Surgeon: Wynonia Sours, MD;  Location: [facility];  Service: Orthopedics;  Laterality: Right;    FAMILY HISTORY Family History  Problem Relation Age of Onset  . Heart disease Father        Before age 45-  CAD, BPG  . Diabetes Father        Amputation  . Cancer Father        PROSTATE  . Hyperlipidemia Father   . Hypertension Father   . Heart attack Father        Triple BPG  . Varicose Veins Father   . Colon cancer Brother   . Diabetes Brother   . Heart disease Brother 63       A-Fib. Before age 30  . Hyperlipidemia Brother   . Hypertension Brother   . Cancer Sister        Breast  . Hyperlipidemia Sister   . Hypertension Sister   . Hypertension Son   . Arthritis Other        GRANDMOTHER  . Hypertension Other        OTHER FAMILY MEMBERS    SOCIAL HISTORY Social History   Tobacco Use  . Smoking status: Current Every Day Smoker    Packs/day: 2.00    Years: 35.00    Pack years: 70.00    Types: Cigarettes, E-cigarettes    Last attempt to quit: 10/28/2011    Years since quitting: 8.3  . Smokeless tobacco: Never Used  Vaping Use  . Vaping Use: Every day  . Substances: Nicotine  Substance Use Topics  . Alcohol use: Not Currently    Comment: "not in a long time"  . Drug use: Not Currently         OPHTHALMIC EXAM:  Base Eye Exam    Visual Acuity (ETDRS)      Right Left   Dist cc 20/50 20/25 +2   Dist ph cc 20/40 -1        Tonometry (Tonopen, 1:23 PM)      Right Left   Pressure 07 08       Pupils      Pupils Dark Light Shape React APD   Right PERRL 4 3 Round Brisk None   Left PERRL 4 3 Round Brisk None       Visual  Fields (Counting fingers)      Left Right    Full Full       Extraocular Movement      Right Left    Full Full       Neuro/Psych    Oriented x3: Yes   Mood/Affect: Normal       Dilation    Left eye: 1.0% Mydriacyl, 2.5% Phenylephrine @ 1:23 PM        Slit Lamp and Fundus Exam    External Exam      Right Left   External Normal Normal       Slit Lamp Exam      Right Left   Lids/Lashes Normal Normal   Conjunctiva/Sclera White and quiet White and quiet   Cornea Clear Clear   Anterior Chamber Deep and quiet Deep and quiet   Iris Round and reactive Round and reactive  Lens Posterior chamber intraocular lens Posterior chamber intraocular lens   Anterior Vitreous Normal Normal       Fundus Exam      Right Left   Posterior Vitreous  Posterior vitreous detachment   Disc  Normal   C/D Ratio  0.25   Macula  Clinically significant macular edema, Exudates   Vessels  NPDR-Severe   Periphery  Normal          IMAGING AND PROCEDURES  Imaging and Procedures for 02/29/20  OCT, Retina - OU - Both Eyes       Right Eye Quality was good. Scan locations included subfoveal. Central Foveal Thickness: 294. Progression has worsened.   Left Eye Quality was good. Scan locations included subfoveal. Central Foveal Thickness: 286. Progression has improved.   Notes CSME OD slightly worse at current interval of examination and follow-up  OS, CSME much improvement.       Intravitreal Injection, Pharmacologic Agent - OS - Left Eye       Time Out 02/29/2020. 2:09 PM. Confirmed correct patient, procedure, site, and patient consented.   Anesthesia Topical anesthesia was used. Anesthetic medications included [place] 3.5%.   Procedure Preparation included 10% betadine to eyelids, Tobramycin 0.3%.   Injection:  1.25 mg Bevacizumab (AVASTIN) SOLN   NDC: [address]-4, Lot: [id number]   Route: Intravitreal, Site: Left Eye, Waste: 0 mg  Post-op Post injection exam found visual  acuity of at least counting fingers. The patient tolerated the procedure well. There were no complications. The patient received written and verbal post procedure care education. Post injection medications were not given.                 ASSESSMENT/PLAN:  Severe nonproliferative diabetic retinopathy of left eye, with macular edema, associated with type 2 diabetes mellitus (HCC) CSME, much less active on examination in 6-week interval.  We will repeat intravitreal Avastin OS today and examination in 7 weeks  Severe nonproliferative diabetic retinopathy of right eye, with macular edema, associated with type 2 diabetes mellitus (HCC) CSME OD, slightly worse at this interval.  Will repeat examination as scheduled in 1 week.  Status post recent injection OD and will examine at 7-week interval OD next week      ICD-10-CM   1. Severe nonproliferative diabetic retinopathy of left eye, with macular edema, associated with type 2 diabetes mellitus (HCC)  B14.7829 OCT, Retina - OU - Both Eyes    Intravitreal Injection, Pharmacologic Agent - OS - Left Eye    Bevacizumab (AVASTIN) SOLN 1.25 mg  2. Severe nonproliferative diabetic retinopathy of right eye, with macular edema, associated with type 2 diabetes mellitus ([place])  E11.3411     1.  2.  3.  Ophthalmic Meds Ordered this visit:  Meds ordered this encounter  Medications  . Bevacizumab (AVASTIN) SOLN 1.25 mg       Return in about 7 weeks (around 04/18/2020) for dilate, OS, AVASTIN OCT.  There are no Patient Instructions on file for this visit.   Explained the diagnoses, plan, and follow up with the patient and they expressed understanding.  Patient expressed understanding of the importance of proper follow up care.   Clent Demark Rayen Palen M.D. Diseases & Surgery of the Retina and Vitreous Retina [facility] Diabetic [place] 02/29/20     Abbreviations: M myopia (nearsighted); A astigmatism; H hyperopia (farsighted); P presbyopia; Mrx  spectacle prescription;  CTL contact lenses; OD right eye; OS left eye; OU both eyes  XT exotropia; ET esotropia;  PEK punctate epithelial keratitis; PEE punctate epithelial erosions; DES dry eye syndrome; MGD meibomian gland dysfunction; ATs artificial tears; PFAT's preservative free artificial tears; [place] nuclear sclerotic cataract; PSC posterior subcapsular cataract; ERM epi-retinal membrane; PVD posterior vitreous detachment; RD retinal detachment; DM diabetes mellitus; DR diabetic retinopathy; NPDR non-proliferative diabetic retinopathy; PDR proliferative diabetic retinopathy; CSME clinically significant macular edema; DME diabetic macular edema; dbh dot blot hemorrhages; CWS cotton wool spot; POAG primary open angle glaucoma; C/D cup-to-disc ratio; HVF humphrey visual field; GVF goldmann visual field; OCT optical coherence tomography; IOP intraocular pressure; BRVO Branch retinal vein occlusion; CRVO central retinal vein occlusion; CRAO central retinal artery occlusion; BRAO branch retinal artery occlusion; RT retinal tear; SB scleral buckle; PPV pars plana vitrectomy; VH Vitreous hemorrhage; PRP panretinal laser photocoagulation; IVK intravitreal kenalog; VMT vitreomacular traction; MH Macular hole;  NVD neovascularization of the disc; NVE neovascularization elsewhere; AREDS age related eye disease study; ARMD age related macular degeneration; POAG primary open angle glaucoma; EBMD epithelial/anterior basement membrane dystrophy; ACIOL anterior chamber intraocular lens; IOL intraocular lens; PCIOL posterior chamber intraocular lens; Phaco/IOL phacoemulsification with intraocular lens placement; [place] photorefractive keratectomy; LASIK laser assisted in situ keratomileusis; HTN hypertension; DM diabetes mellitus; COPD chronic obstructive pulmonary disease

## 2020-03-07 ENCOUNTER — Ambulatory Visit (INDEPENDENT_AMBULATORY_CARE_PROVIDER_SITE_OTHER): Payer: 59 | Admitting: Ophthalmology

## 2020-03-07 ENCOUNTER — Ambulatory Visit (HOSPITAL_COMMUNITY)
Admission: RE | Admit: 2020-03-07 | Discharge: 2020-03-07 | Disposition: A | Discharge status: Home / Self Care | Payer: 59 | Source: Ambulatory Visit | Attending: Nephrology | Admitting: Nephrology

## 2020-03-07 ENCOUNTER — Other Ambulatory Visit: Payer: Self-pay

## 2020-03-07 ENCOUNTER — Encounter (INDEPENDENT_AMBULATORY_CARE_PROVIDER_SITE_OTHER): Payer: Self-pay | Admitting: Ophthalmology

## 2020-03-07 DIAGNOSIS — E113411 Type 2 diabetes mellitus with severe nonproliferative diabetic retinopathy with macular edema, right eye: Secondary | ICD-10-CM

## 2020-03-07 DIAGNOSIS — N183 Chronic kidney disease, stage 3 unspecified: Secondary | ICD-10-CM | POA: Diagnosis present

## 2020-03-07 DIAGNOSIS — D631 Anemia in chronic kidney disease: Secondary | ICD-10-CM | POA: Insufficient documentation

## 2020-03-07 DIAGNOSIS — I6523 Occlusion and stenosis of bilateral carotid arteries: Secondary | ICD-10-CM

## 2020-03-07 LAB — POCT HEMOGLOBIN-HEMACUE: Hemoglobin: 10 g/dL — ABNORMAL LOW (ref 13.0–17.0)

## 2020-03-07 MED ORDER — EPOETIN ALFA-EPBX 10000 UNIT/ML IJ SOLN
INTRAMUSCULAR | Status: AC
  Filled 2020-03-07: qty 2

## 2020-03-07 MED ORDER — EPOETIN ALFA-EPBX 10000 UNIT/ML IJ SOLN
20000.0000 [IU] | Freq: Once | INTRAMUSCULAR | Status: DC
  Administered 2020-03-07: 20000 [IU] via SUBCUTANEOUS

## 2020-03-07 MED ORDER — AFLIBERCEPT 2MG/0.05ML IZ SOLN FOR KALEIDOSCOPE
2.0000 mg | INTRAVITREAL | Status: AC | PRN
  Administered 2020-03-07: 2 mg via INTRAVITREAL

## 2020-03-07 NOTE — Progress Notes (Signed)
03/07/2020     CHIEF COMPLAINT Patient presents for Retina Follow Up   HISTORY OF PRESENT ILLNESS: [name] is a 64 y.o. male who presents to the clinic today for:   HPI    Retina Follow Up    Patient presents with  Diabetic Retinopathy.  In left eye.  Duration of 7 weeks.  Since onset it is stable.          Comments    7 week follow up - OCT OU, Poss Eylea OD Patient denies change in vision and overall has no complaints.        Last edited by Gerda Diss on 03/07/2020  2:11 PM. (History)      Referring physician: [name], PA-C [place],   [id number]  HISTORICAL INFORMATION:   Selected notes from the MEDICAL RECORD NUMBER    Lab Results  Component Value Date   HGBA1C 5.7 (H) 09/16/2018     CURRENT MEDICATIONS: Current Outpatient Medications (Ophthalmic Drugs)  Medication Sig  . Polyvinyl Alcohol-Povidone PF (REFRESH) 1.4-0.6 % SOLN Place 1 drop into both eyes daily.   No current facility-administered medications for this visit. (Ophthalmic Drugs)   Current Outpatient Medications (Other)  Medication Sig  . amLODipine (NORVASC) 10 MG tablet Take 1 tablet (10 mg total) by mouth daily.  Marland Kitchen aspirin EC 81 MG tablet Take 81 mg by mouth daily.  Marland Kitchen atorvastatin (LIPITOR) 40 MG tablet Take 40 mg by mouth daily.  . carvedilol (COREG) 25 MG tablet Take 1.5 tablets (37.5 mg total) by mouth 2 (two) times daily.  . clobetasol (TEMOVATE) 0.05 % external solution Apply 1 application topically daily as needed (dry scalp).  . clopidogrel (PLAVIX) 75 MG tablet Take 75 mg by mouth daily.   . clotrimazole-betamethasone (LOTRISONE) cream Apply 1 application topically as needed (on affected areas on skin).  . collagenase (SANTYL) ointment Apply topically daily. (Patient taking differently: Apply 1 application topically daily as needed (wound care). TO AFFECTED SITE(S))  . doxycycline (VIBRA-TABS) 100 MG tablet Take 1 tablet (100 mg total) by mouth 2  (two) times daily.  . fentaNYL (DURAGESIC - DOSED MCG/HR) 50 MCG/HR Place 50 mcg onto the skin every 3 (three) days.  . ferrous sulfate 325 (65 FE) MG tablet Take 325 mg by mouth daily with breakfast.  . fluticasone (CUTIVATE) 0.05 % cream Apply 1 application topically 2 (two) times daily as needed (dry skin on face).   . folic acid (FOLVITE) 130 MCG tablet Take 800 mcg by mouth daily.   . Glucosamine HCl (GLUCOSAMINE PO) Take 2,000 mg by mouth daily.   Marland Kitchen HUMALOG KWIKPEN 100 UNIT/ML KiwkPen Inject 15 Units into the skin 3 (three) times daily with meals. 15 units with breakfast, lunch, & dinner-- pt may increase dose by 1 unit if sugar is elevated  . ketoconazole (NIZORAL) 2 % cream Apply 1 application topically 2 (two) times daily.  Marland Kitchen linaclotide (LINZESS) 145 MCG CAPS capsule Take 145 mcg by mouth daily before breakfast.  . Multiple Vitamin (MULTIVITAMIN) tablet Take 1 tablet by mouth daily.  Marland Kitchen NEEDLE, REUSABLE, 22 G 22G X 1-1/2" MISC 1 Units by Does not apply route as directed. For B12 IM inj  . omega-3 acid ethyl esters (LOVAZA) 1 g capsule Take 2 g by mouth 2 (two) times daily.  . ondansetron (ZOFRAN) 4 MG tablet Take 4 mg by mouth every 6 (six) hours as needed for nausea or vomiting.  Marland Kitchen oxyCODONE-acetaminophen (PERCOCET/ROXICET)  5-325 MG tablet Take 1 tablet by mouth every 8 (eight) hours as needed. (Patient taking differently: Take 1 tablet by mouth 3 (three) times daily. )  . pantoprazole (PROTONIX) 40 MG tablet Take 40 mg by mouth daily.  . pregabalin (LYRICA) 100 MG capsule Take 1 capsule (100 mg total) by mouth 3 (three) times daily.  Marland Kitchen rOPINIRole (REQUIP) 2 MG tablet Take 2 mg by mouth at bedtime.   . Syringe/Needle, Disp, (SYRINGE 3CC/22GX1-1/2") 22G X 1-1/2" 3 ML MISC 1 Syringe by Does not apply route as directed. For b12 IM inj  . tamsulosin (FLOMAX) 0.4 MG CAPS capsule Take 0.4 mg by mouth daily.  Marland Kitchen torsemide (DEMADEX) 20 MG tablet Take 20 mg by mouth daily. May take an additional  tablet for increased swelling.   No current facility-administered medications for this visit. (Other)   Facility-Administered Medications Ordered in Other Visits (Other)  Medication Route  . epoetin alfa-epbx (RETACRIT) 42353 UNIT/ML injection       REVIEW OF SYSTEMS:    ALLERGIES Allergies  Allergen Reactions  . Contrast Media [Iodinated Diagnostic Agents]     Difficulty breathing    . Ivp Dye [Iodinated Diagnostic Agents] Anaphylaxis and Other (See Comments)    Breathing problems   . Adhesive [Tape] Rash    Rash after 2-3 days use    PAST MEDICAL HISTORY Past Medical History:  Diagnosis Date  . Carotid artery occlusion 11/10/10   LEFT CAROTID ENDARTERECTOMY  . Complication of anesthesia    BP WENT UP AT [facility] "  . COPD (chronic obstructive pulmonary disease) ([place])   . Diverticulitis   . Diverticulosis of colon (without mention of hemorrhage)   . DJD (degenerative joint disease)   . Fatty liver   . Full dentures   . GERD (gastroesophageal reflux disease)   . H/O hiatal hernia   . Hyperlipidemia   . Hypertension   . Neuromuscular disorder ([place])    peripheral neuropathy  . Non-pressure chronic ulcer of other part of left foot limited to breakdown of skin ([place]) 11/12/2016  . Osteomyelitis ([place])    left 5th metatarsal  . PAD (peripheral artery disease) ([place])    Distal aortogram June 2012. Atherectomy left popliteal artery July 2012.   . Pseudoclaudication 11/15/2018  . Slurred speech    AS PER WIFE IN D/C NOTE 11/10/10  . 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 Unstable angina ([place]) 09/16/2018  . Wears glasses    Past Surgical History:  Procedure Laterality Date  . AMPUTATION  11/05/2011   Procedure: AMPUTATION RAY;  Surgeon: Wylene Simmer, MD;  Location: [facility];  Service: Orthopedics;  Laterality: Right;  Amputation of Right 4&5th Toes  . AMPUTATION Left 11/26/2012   Procedure: AMPUTATION RAY;  Surgeon: Wylene Simmer, MD;  Location: [facility];  Service: Orthopedics;  Laterality:  Left;  fourth ray amputation  . AMPUTATION Right 08/27/2014   Procedure: Transmetatarsal Amputation;  Surgeon: Newt Minion, MD;  Location: [facility];  Service: Orthopedics;  Laterality: Right;  . AMPUTATION Right 01/14/2015   Procedure: AMPUTATION BELOW KNEE;  Surgeon: Newt Minion, MD;  Location: [facility];  Service: Orthopedics;  Laterality: Right;  . AMPUTATION Left 10/21/2015   Procedure: Left Foot 5th Ray Amputation;  Surgeon: Newt Minion, MD;  Location: [facility];  Service: Orthopedics;  Laterality: Left;  . ANTERIOR FUSION CERVICAL SPINE  02/06/06   C4-5, C5-6, C6-7; SURGEON DR. MAX COHEN  . BACK SURGERY    . BELOW KNEE LEG AMPUTATION  Right   . CARDIAC CATHETERIZATION  10/31/04   2009  . CAROTID ENDARTERECTOMY  11/10/10  . CAROTID ENDARTERECTOMY Left 11/10/2010   Subtotal occlusion of left internal carotid artery with left hemispheric transient ischemic attacks.  . CAROTID STENT    . CARPAL TUNNEL RELEASE Right 10/21/2013   Procedure: RIGHT CARPAL TUNNEL RELEASE;  Surgeon: Wynonia Sours, MD;  Location: [facility];  Service: Orthopedics;  Laterality: Right;  . CHOLECYSTECTOMY    . COLON SURGERY    . COLONOSCOPY    . COLOSTOMY REVERSAL  05/21/2018   ileostomy reversal  . CYSTOSCOPY WITH STENT PLACEMENT Bilateral 01/13/2018   Procedure: CYSTOSCOPY WITH BILATERAL URETERAL CATHETER PLACEMENT;  Surgeon: Ardis Hughs, MD;  Location: [facility];  Service: Urology;  Laterality: Bilateral;  . ESOPHAGEAL MANOMETRY Bilateral 07/19/2014   Procedure: ESOPHAGEAL MANOMETRY (EM);  Surgeon: Jerene Bears, MD;  [facility];  Service: Gastroenterology;  Laterality: Bilateral;  . FEMORAL ARTERY STENT     x6  . FINGER SURGERY    . FOOT SURGERY  04/25/2016    EXCISION BASE 5TH METATARSAL AND PARTIAL CUBOID LEFT FOOT  . HERNIA REPAIR     LEFT INGUINAL AND UMBILICAL REPAIRS  . HERNIA REPAIR    . I & D EXTREMITY Left 04/25/2016   Procedure: EXCISION BASE 5TH METATARSAL AND PARTIAL CUBOID LEFT  FOOT;  Surgeon: Newt Minion, MD;  Location: [facility];  Service: Orthopedics;  Laterality: Left;  . ILEOSTOMY  01/13/2018   Procedure: ILEOSTOMY;  Surgeon: Clovis Riley, MD;  Location: [facility];  Service: General;;  . ILEOSTOMY CLOSURE N/A 05/21/2018   Procedure: ILEOSTOMY REVERSAL ERAS PATHWAY;  Surgeon: Clovis Riley, MD;  Location: [facility];  Service: General;  Laterality: N/A;  . IR RADIOLOGIST EVAL & MGMT  11/19/2017  . IR RADIOLOGIST EVAL & MGMT  12/03/2017  . IR RADIOLOGIST EVAL & MGMT  12/18/2017  . JOINT REPLACEMENT Right 2001   Total  . LAMINECTOMY     X 3 LUMBAR AND X 2 CERVICAL SPINE OPERATIONS  . LAPAROSCOPIC CHOLECYSTECTOMY W/ CHOLANGIOGRAPHY  11/09/04   SURGEON DR. Luella Cook  . LEFT HEART CATH AND CORONARY ANGIOGRAPHY N/A 09/16/2018   Procedure: LEFT HEART CATH AND CORONARY ANGIOGRAPHY;  Surgeon: Nigel Mormon, MD;  Location: [facility];  Service: Cardiovascular;  Laterality: N/A;  . LEFT HEART CATHETERIZATION WITH CORONARY ANGIOGRAM N/A 10/29/2014   Procedure: LEFT HEART CATHETERIZATION WITH CORONARY ANGIOGRAM;  Surgeon: Laverda Page, MD;  Location[facility];  Service: Cardiovascular;  Laterality: N/A;  . LOWER EXTREMITY ANGIOGRAM N/A 03/19/2012   Procedure: LOWER EXTREMITY ANGIOGRAM;  Surgeon: Burnell Blanks, MD;  Location: [facility];  Service: Cardiovascular;  Laterality: N/A;  . NECK SURGERY    . PARTIAL COLECTOMY N/A 01/13/2018   Procedure: LAPAROSCOPIC ASSISTED   SIGMOID COLECTOMY ILEOSTOMY;  Surgeon: Clovis Riley, MD;  Location[facility];  Service: General;  Laterality: N/A;  . PENILE PROSTHESIS IMPLANT  08/14/05   INFRAPUBIC INSERTION OF INFLATABLE PENILE PROSTHESIS; SURGEON DR. Amalia Hailey  . PENILE PROSTHESIS IMPLANT    . PERCUTANEOUS CORONARY STENT INTERVENTION (PCI-S) Right 10/29/2014   Procedure: PERCUTANEOUS CORONARY STENT INTERVENTION (PCI-S);  Surgeon: Laverda Page, MD;  Location: [facility] CATH LAB;  Service: Cardiovascular;  Laterality:  Right;  . SHOULDER ARTHROSCOPY    . SPINE SURGERY    . TOE AMPUTATION Left   . TONSILLECTOMY    . TOTAL KNEE ARTHROPLASTY  07/2002   RIGHT KNEE ; SURGEON  DR. [name] ALSO HAD ARTHROSCOPIC RIGHT KNEE IN  10/2001  . TOTAL KNEE ARTHROPLASTY    . ULNAR NERVE TRANSPOSITION Right 10/21/2013   Procedure: RIGHT ELBOW  ULNAR NERVE DECOMPRESSION;  Surgeon: Wynonia Sours, MD;  Location: [facility];  Service: Orthopedics;  Laterality: Right;    FAMILY HISTORY Family History  Problem Relation Age of Onset  . Heart disease Father        Before age 10-  CAD, BPG  . Diabetes Father        Amputation  . Cancer Father        PROSTATE  . Hyperlipidemia Father   . Hypertension Father   . Heart attack Father        Triple BPG  . Varicose Veins Father   . Colon cancer Brother   . Diabetes Brother   . Heart disease Brother 34       A-Fib. Before age 58  . Hyperlipidemia Brother   . Hypertension Brother   . Cancer Sister        Breast  . Hyperlipidemia Sister   . Hypertension Sister   . Hypertension Son   . Arthritis Other        GRANDMOTHER  . Hypertension Other        OTHER FAMILY MEMBERS    SOCIAL HISTORY Social History   Tobacco Use  . Smoking status: Current Every Day Smoker    Packs/day: 2.00    Years: 35.00    Pack years: 70.00    Types: Cigarettes, E-cigarettes    Last attempt to quit: 10/28/2011    Years since quitting: 8.3  . Smokeless tobacco: Never Used  Vaping Use  . Vaping Use: Every day  . Substances: Nicotine  Substance Use Topics  . Alcohol use: Not Currently    Comment: "not in a long time"  . Drug use: Not Currently         OPHTHALMIC EXAM:  Base Eye Exam    Visual Acuity (Snellen - Linear)      Right Left   Dist [place] 20/40+1 20/20-2   Dist ph [place] 20/30-2        Tonometry (Tonopen, 2:23 PM)      Right Left   Pressure 10 11       Pupils      Pupils Dark Light Shape React APD   Right PERRL 5 4 Round Brisk None   Left PERRL 5 4  Round Brisk None       Visual Fields (Counting fingers)      Left Right    Full Full       Extraocular Movement      Right Left    Full Full       Neuro/Psych    Oriented x3: Yes   Mood/Affect: Normal       Dilation    Right eye: 1.0% Mydriacyl, 2.5% Phenylephrine @ 2:23 PM        Slit Lamp and Fundus Exam    External Exam      Right Left   External Normal Normal       Slit Lamp Exam      Right Left   Lids/Lashes Normal Normal   Conjunctiva/Sclera White and quiet White and quiet   Cornea Clear Clear   Anterior Chamber Deep and quiet Deep and quiet   Iris Round and reactive Round and reactive   Lens Posterior  chamber intraocular lens Posterior chamber intraocular lens   Anterior Vitreous Normal Normal       Fundus Exam      Right Left   Posterior Vitreous Normal    Disc Normal    C/D Ratio 0.4    Macula Exudates, Microaneurysms, Mild clinically significant macular edema    Vessels NPDR severe    Periphery Normal           IMAGING AND PROCEDURES  Imaging and Procedures for 03/07/20  Hemoglobin-hemacue, POC     Component Value Flag Ref Range Units Status   Hemoglobin 10.0      13.0 - 17.0 g/dL Final        OCT, Retina - OU - Both Eyes       Right Eye Quality was good. Scan locations included subfoveal. Central Foveal Thickness: 274. Progression has improved.   Left Eye Quality was good. Scan locations included subfoveal. Central Foveal Thickness: 282. Progression has improved.        Intravitreal Injection, Pharmacologic Agent - OD - Right Eye       Time Out 03/07/2020. 3:18 PM. Confirmed correct patient, procedure, site, and patient consented.   Anesthesia Topical anesthesia was used. Anesthetic medications included Akten 3.5%.   Procedure Preparation included Tobramycin 0.3%. A 30 gauge needle was used.   Injection:  2 mg aflibercept Alfonse Flavors) SOLN   NDC: [id number], Lot: [id number]   Route: Intravitreal, Site: Right Eye, Waste:  0 mg  Post-op Post injection exam found visual acuity of at least counting fingers. The patient tolerated the procedure well. There were no complications. The patient received written and verbal post procedure care education. Post injection medications were not given.                 ASSESSMENT/PLAN:  Severe nonproliferative diabetic retinopathy of right eye, with macular edema, associated with type 2 diabetes mellitus ([place])  The nature of diabetic macular edema was discussed with the patient. Treatment options were outlined including medical therapy, laser & vitrectomy. The use of injectable medications reviewed, including Avastin, Lucentis, and Eylea. Periodic injections into the eye are likely to resolve diabetic macular edema (swelling in the center of vision). Initially, injections are delivered are delivered every 4-6 weeks, and the interval extended as the condition improves. On average, 8-9 injections the first year, and 5 in year 2. Improvement in the condition most often improves on medical therapy. Occasional use of focal laser is also recommended for residual macular edema (swelling). Excellent control of blood glucose and blood pressure are encouraged under the care of a primary physician or endocrinologist. Similarly, attempts to maintain serum cholesterol, low density lipoproteins, and high-density lipoproteins in a favorable range were recommended.   OD, much less active still active at 7 weeks we will repeat intravitreal Eylea today and examination OD in 8 weeks      ICD-10-CM   1. Severe nonproliferative diabetic retinopathy of right eye, with macular edema, associated with type 2 diabetes mellitus (HCC)  E11.3411 OCT, Retina - OU - Both Eyes    Intravitreal Injection, Pharmacologic Agent - OD - Right Eye    aflibercept (EYLEA) SOLN 2 mg    1.  Repeat injection intravitreal Eylea OD now  2.  OS, follow-up for CSME and possible injection Eylea as  scheduled  3.  Ophthalmic Meds Ordered this visit:  Meds ordered this encounter  Medications  . aflibercept (EYLEA) SOLN 2 mg       Return in  about 8 weeks (around 05/02/2020) for dilate, OD, EYLEA OCT.  There are no Patient Instructions on file for this visit.   Explained the diagnoses, plan, and follow up with the patient and they expressed understanding.  Patient expressed understanding of the importance of proper follow up care.   Clent Demark Josiyah Tozzi M.D. Diseases & Surgery of the Retina and Vitreous Retina & Diabetic [place] 03/07/20     Abbreviations: M myopia (nearsighted); A astigmatism; H hyperopia (farsighted); P presbyopia; Mrx spectacle prescription;  CTL contact lenses; OD right eye; OS left eye; OU both eyes  XT exotropia; ET esotropia; PEK punctate epithelial keratitis; PEE punctate epithelial erosions; DES dry eye syndrome; MGD meibomian gland dysfunction; ATs artificial tears; PFAT's preservative free artificial tears; [place] nuclear sclerotic cataract; PSC posterior subcapsular cataract; ERM epi-retinal membrane; PVD posterior vitreous detachment; RD retinal detachment; DM diabetes mellitus; DR diabetic retinopathy; NPDR non-proliferative diabetic retinopathy; PDR proliferative diabetic retinopathy; CSME clinically significant macular edema; DME diabetic macular edema; dbh dot blot hemorrhages; CWS cotton wool spot; POAG primary open angle glaucoma; C/D cup-to-disc ratio; HVF humphrey visual field; GVF goldmann visual field; OCT optical coherence tomography; IOP intraocular pressure; BRVO Branch retinal vein occlusion; CRVO central retinal vein occlusion; CRAO central retinal artery occlusion; BRAO branch retinal artery occlusion; RT retinal tear; SB scleral buckle; PPV pars plana vitrectomy; VH Vitreous hemorrhage; PRP panretinal laser photocoagulation; IVK intravitreal kenalog; VMT vitreomacular traction; MH Macular hole;  NVD neovascularization of the disc; NVE  neovascularization elsewhere; AREDS age related eye disease study; ARMD age related macular degeneration; POAG primary open angle glaucoma; EBMD epithelial/anterior basement membrane dystrophy; ACIOL anterior chamber intraocular lens; IOL intraocular lens; PCIOL posterior chamber intraocular lens; Phaco/IOL phacoemulsification with intraocular lens placement; [place] photorefractive keratectomy; LASIK laser assisted in situ keratomileusis; HTN hypertension; DM diabetes mellitus; COPD chronic obstructive pulmonary disease

## 2020-03-07 NOTE — Assessment & Plan Note (Signed)
The nature of diabetic macular edema was discussed with the patient. Treatment options were outlined including medical therapy, laser & vitrectomy. The use of injectable medications reviewed, including Avastin, Lucentis, and Eylea. Periodic injections into the eye are likely to resolve diabetic macular edema (swelling in the center of vision). Initially, injections are delivered are delivered every 4-6 weeks, and the interval extended as the condition improves. On average, 8-9 injections the first year, and 5 in year 2. Improvement in the condition most often improves on medical therapy. Occasional use of focal laser is also recommended for residual macular edema (swelling). Excellent control of blood glucose and blood pressure are encouraged under the care of a primary physician or endocrinologist. Similarly, attempts to maintain serum cholesterol, low density lipoproteins, and high-density lipoproteins in a favorable range were recommended.   OD, much less active still active at 7 weeks we will repeat intravitreal Eylea today and examination OD in 8 weeks

## 2020-03-21 ENCOUNTER — Other Ambulatory Visit: Payer: Self-pay

## 2020-03-21 ENCOUNTER — Encounter (HOSPITAL_COMMUNITY)
Admission: RE | Admit: 2020-03-21 | Discharge: 2020-03-21 | Disposition: A | Discharge status: Home / Self Care | Payer: 59 | Source: Ambulatory Visit | Attending: Nephrology | Admitting: Nephrology

## 2020-03-21 DIAGNOSIS — N189 Chronic kidney disease, unspecified: Secondary | ICD-10-CM | POA: Insufficient documentation

## 2020-03-21 DIAGNOSIS — D631 Anemia in chronic kidney disease: Secondary | ICD-10-CM | POA: Insufficient documentation

## 2020-03-21 LAB — IRON AND TIBC
Iron: 93 ug/dL (ref 45–182)
Saturation Ratios: 38 % (ref 17.9–39.5)
TIBC: 248 ug/dL — ABNORMAL LOW (ref 250–450)
UIBC: 155 ug/dL

## 2020-03-21 LAB — FERRITIN: Ferritin: 212 ng/mL (ref 24–336)

## 2020-03-21 MED ORDER — EPOETIN ALFA-EPBX 10000 UNIT/ML IJ SOLN: 20000.0000 [IU] | Freq: Once | INTRAMUSCULAR | Status: DC

## 2020-03-21 MED ORDER — EPOETIN ALFA-EPBX 10000 UNIT/ML IJ SOLN
INTRAMUSCULAR | Status: AC
  Administered 2020-03-21: 20000 [IU]
  Filled 2020-03-21: qty 2

## 2020-03-22 LAB — POCT HEMOGLOBIN-HEMACUE: Hemoglobin: 11 g/dL — ABNORMAL LOW (ref 13.0–17.0)

## 2020-04-01 ENCOUNTER — Other Ambulatory Visit (HOSPITAL_COMMUNITY): Payer: Self-pay

## 2020-04-04 ENCOUNTER — Encounter (HOSPITAL_COMMUNITY): Payer: 59

## 2020-04-13 ENCOUNTER — Encounter (HOSPITAL_COMMUNITY)
Admission: RE | Admit: 2020-04-13 | Discharge: 2020-04-13 | Disposition: A | Discharge status: Home / Self Care | Payer: 59 | Source: Ambulatory Visit | Attending: Nephrology | Admitting: Nephrology

## 2020-04-13 ENCOUNTER — Other Ambulatory Visit: Payer: Self-pay

## 2020-04-13 DIAGNOSIS — D631 Anemia in chronic kidney disease: Secondary | ICD-10-CM | POA: Insufficient documentation

## 2020-04-13 DIAGNOSIS — N183 Chronic kidney disease, stage 3 unspecified: Secondary | ICD-10-CM | POA: Insufficient documentation

## 2020-04-13 LAB — POCT HEMOGLOBIN-HEMACUE: Hemoglobin: 10.8 g/dL — ABNORMAL LOW (ref 13.0–17.0)

## 2020-04-13 MED ORDER — EPOETIN ALFA-EPBX 10000 UNIT/ML IJ SOLN
20000.0000 [IU] | Freq: Once | INTRAMUSCULAR | Status: AC
  Administered 2020-04-13: 20000 [IU] via SUBCUTANEOUS

## 2020-04-13 MED ORDER — EPOETIN ALFA-EPBX 10000 UNIT/ML IJ SOLN
INTRAMUSCULAR | Status: AC
  Filled 2020-04-13: qty 2

## 2020-04-18 ENCOUNTER — Encounter (HOSPITAL_COMMUNITY): Payer: 59

## 2020-04-18 ENCOUNTER — Ambulatory Visit (INDEPENDENT_AMBULATORY_CARE_PROVIDER_SITE_OTHER): Payer: 59 | Admitting: Ophthalmology

## 2020-04-18 ENCOUNTER — Other Ambulatory Visit: Payer: Self-pay

## 2020-04-18 ENCOUNTER — Encounter (INDEPENDENT_AMBULATORY_CARE_PROVIDER_SITE_OTHER): Payer: Self-pay | Admitting: Ophthalmology

## 2020-04-18 DIAGNOSIS — E113412 Type 2 diabetes mellitus with severe nonproliferative diabetic retinopathy with macular edema, left eye: Secondary | ICD-10-CM | POA: Diagnosis not present

## 2020-04-18 DIAGNOSIS — E113411 Type 2 diabetes mellitus with severe nonproliferative diabetic retinopathy with macular edema, right eye: Secondary | ICD-10-CM

## 2020-04-18 DIAGNOSIS — I6523 Occlusion and stenosis of bilateral carotid arteries: Secondary | ICD-10-CM

## 2020-04-18 MED ORDER — BEVACIZUMAB CHEMO INJECTION 1.25MG/0.05ML SYRINGE FOR KALEIDOSCOPE
1.2500 mg | INTRAVITREAL | Status: AC | PRN
  Administered 2020-04-18: 1.25 mg via INTRAVITREAL

## 2020-04-18 NOTE — Progress Notes (Signed)
04/18/2020     CHIEF COMPLAINT Patient presents for Retina Follow Up   HISTORY OF PRESENT ILLNESS: [name] is a 64 y.o. male who presents to the clinic today for:   HPI    Retina Follow Up    Patient presents with  Diabetic Retinopathy.  In left eye.  This started 7 weeks ago.  Severity is mild.  Duration of 7 weeks.  Since onset it is stable.          Comments    7 Week Diabetic F/U OS, poss Avastin OS  Pt denies noticeable changes to [place] OU since last visit. Pt denies ocular pain, flashes of light, or floaters OU.  LBS: 200 this AM       Last edited by Rockie Neighbours, [facility] on 04/18/2020  1:54 PM. (History)      Referring physician: [name], PA-C [place],  [place] [address]  HISTORICAL INFORMATION:   Selected notes from the MEDICAL RECORD NUMBER    Lab Results  Component Value Date   HGBA1C 5.7 (H) 09/16/2018     CURRENT MEDICATIONS: Current Outpatient Medications (Ophthalmic Drugs)  Medication Sig  . Polyvinyl Alcohol-Povidone PF (REFRESH) 1.4-0.6 % SOLN Place 1 drop into both eyes daily.   No current facility-administered medications for this visit. (Ophthalmic Drugs)   Current Outpatient Medications (Other)  Medication Sig  . amLODipine (NORVASC) 10 MG tablet Take 1 tablet (10 mg total) by mouth daily.  Marland Kitchen aspirin EC 81 MG tablet Take 81 mg by mouth daily.  Marland Kitchen atorvastatin (LIPITOR) 40 MG tablet Take 40 mg by mouth daily.  . carvedilol (COREG) 25 MG tablet Take 1.5 tablets (37.5 mg total) by mouth 2 (two) times daily.  . clobetasol (TEMOVATE) 0.05 % external solution Apply 1 application topically daily as needed (dry scalp).  . clopidogrel (PLAVIX) 75 MG tablet Take 75 mg by mouth daily.   . clotrimazole-betamethasone (LOTRISONE) cream Apply 1 application topically as needed (on affected areas on skin).  . collagenase (SANTYL) ointment Apply topically daily. (Patient taking differently: Apply 1 application topically daily as needed  (wound care). TO AFFECTED SITE(S))  . doxycycline (VIBRA-TABS) 100 MG tablet Take 1 tablet (100 mg total) by mouth 2 (two) times daily.  . fentaNYL (DURAGESIC - DOSED MCG/HR) 50 MCG/HR Place 50 mcg onto the skin every 3 (three) days.  . ferrous sulfate 325 (65 FE) MG tablet Take 325 mg by mouth daily with breakfast.  . fluticasone (CUTIVATE) 0.05 % cream Apply 1 application topically 2 (two) times daily as needed (dry skin on face).   . folic acid (FOLVITE) 237 MCG tablet Take 800 mcg by mouth daily.   . Glucosamine HCl (GLUCOSAMINE PO) Take 2,000 mg by mouth daily.   Marland Kitchen HUMALOG KWIKPEN 100 UNIT/ML KiwkPen Inject 15 Units into the skin 3 (three) times daily with meals. 15 units with breakfast, lunch, & dinner-- pt may increase dose by 1 unit if sugar is elevated  . ketoconazole (NIZORAL) 2 % cream Apply 1 application topically 2 (two) times daily.  Marland Kitchen linaclotide (LINZESS) 145 MCG CAPS capsule Take 145 mcg by mouth daily before breakfast.  . Multiple Vitamin (MULTIVITAMIN) tablet Take 1 tablet by mouth daily.  Marland Kitchen NEEDLE, REUSABLE, 22 G 22G X 1-1/2" MISC 1 Units by Does not apply route as directed. For B12 IM inj  . omega-3 acid ethyl esters (LOVAZA) 1 g capsule Take 2 g by mouth 2 (two) times daily.  Marland Kitchen  ondansetron (ZOFRAN) 4 MG tablet Take 4 mg by mouth every 6 (six) hours as needed for nausea or vomiting.  Marland Kitchen oxyCODONE-acetaminophen (PERCOCET/ROXICET) 5-325 MG tablet Take 1 tablet by mouth every 8 (eight) hours as needed. (Patient taking differently: Take 1 tablet by mouth 3 (three) times daily. )  . pantoprazole (PROTONIX) 40 MG tablet Take 40 mg by mouth daily.  . pregabalin (LYRICA) 100 MG capsule Take 1 capsule (100 mg total) by mouth 3 (three) times daily.  Marland Kitchen rOPINIRole (REQUIP) 2 MG tablet Take 2 mg by mouth at bedtime.   . Syringe/Needle, Disp, (SYRINGE 3CC/22GX1-1/2") 22G X 1-1/2" 3 ML MISC 1 Syringe by Does not apply route as directed. For b12 IM inj  . tamsulosin (FLOMAX) 0.4 MG CAPS  capsule Take 0.4 mg by mouth daily.  Marland Kitchen torsemide (DEMADEX) 20 MG tablet Take 20 mg by mouth daily. May take an additional tablet for increased swelling.   No current facility-administered medications for this visit. (Other)      REVIEW OF SYSTEMS:    ALLERGIES Allergies  Allergen Reactions  . Contrast Media [Iodinated Diagnostic Agents]     Difficulty breathing    . Ivp Dye [Iodinated Diagnostic Agents] Anaphylaxis and Other (See Comments)    Breathing problems   . Adhesive [Tape] Rash    Rash after 2-3 days use    PAST MEDICAL HISTORY Past Medical History:  Diagnosis Date  . Carotid artery occlusion 11/10/10   LEFT CAROTID ENDARTERECTOMY  . Complication of anesthesia    BP WENT UP AT [facility] "  . COPD (chronic obstructive pulmonary disease) ([place])   . Diverticulitis   . Diverticulosis of colon (without mention of hemorrhage)   . DJD (degenerative joint disease)   . Fatty liver   . Full dentures   . GERD (gastroesophageal reflux disease)   . H/O hiatal hernia   . Hyperlipidemia   . Hypertension   . Neuromuscular disorder ([place])    peripheral neuropathy  . Non-pressure chronic ulcer of other part of left foot limited to breakdown of skin ([place]) 11/12/2016  . Osteomyelitis ([place])    left 5th metatarsal  . PAD (peripheral artery disease) ([place])    Distal aortogram June 2012. Atherectomy left popliteal artery July 2012.   . Pseudoclaudication 11/15/2018  . Slurred speech    AS PER WIFE IN D/C NOTE 11/10/10  . 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 Unstable angina ([place]) 09/16/2018  . Wears glasses    Past Surgical History:  Procedure Laterality Date  . AMPUTATION  11/05/2011   Procedure: AMPUTATION RAY;  Surgeon: Wylene Simmer, MD;  Location: [facility];  Service: Orthopedics;  Laterality: Right;  Amputation of Right 4&5th Toes  . AMPUTATION Left 11/26/2012   Procedure: AMPUTATION RAY;  Surgeon: Wylene Simmer, MD;  Location: [facility];  Service: Orthopedics;  Laterality: Left;  fourth ray amputation   . AMPUTATION Right 08/27/2014   Procedure: Transmetatarsal Amputation;  Surgeon: Newt Minion, MD;  Location: [facility];  Service: Orthopedics;  Laterality: Right;  . AMPUTATION Right 01/14/2015   Procedure: AMPUTATION BELOW KNEE;  Surgeon: Newt Minion, MD;  Location: [facility];  Service: Orthopedics;  Laterality: Right;  . AMPUTATION Left 10/21/2015   Procedure: Left Foot 5th Ray Amputation;  Surgeon: Newt Minion, MD;  Location: [facility];  Service: Orthopedics;  Laterality: Left;  . ANTERIOR FUSION CERVICAL SPINE  02/06/06   C4-5, C5-6, C6-7; SURGEON DR. MAX COHEN  . BACK SURGERY    . BELOW  KNEE LEG AMPUTATION Right   . CARDIAC CATHETERIZATION  10/31/04   2009  . CAROTID ENDARTERECTOMY  11/10/10  . CAROTID ENDARTERECTOMY Left 11/10/2010   Subtotal occlusion of left internal carotid artery with left hemispheric transient ischemic attacks.  . CAROTID STENT    . CARPAL TUNNEL RELEASE Right 10/21/2013   Procedure: RIGHT CARPAL TUNNEL RELEASE;  Surgeon: Wynonia Sours, MD;  Location: [facility];  Service: Orthopedics;  Laterality: Right;  . CHOLECYSTECTOMY    . COLON SURGERY    . COLONOSCOPY    . COLOSTOMY REVERSAL  05/21/2018   ileostomy reversal  . CYSTOSCOPY WITH STENT PLACEMENT Bilateral 01/13/2018   Procedure: CYSTOSCOPY WITH BILATERAL URETERAL CATHETER PLACEMENT;  Surgeon: Ardis Hughs, MD;  Location[facility];  Service: Urology;  Laterality: Bilateral;  . ESOPHAGEAL MANOMETRY Bilateral 07/19/2014   Procedure: ESOPHAGEAL MANOMETRY (EM);  Surgeon: Jerene Bears, MD;  [facility];  Service: Gastroenterology;  Laterality: Bilateral;  . FEMORAL ARTERY STENT     x6  . FINGER SURGERY    . FOOT SURGERY  04/25/2016    EXCISION BASE 5TH METATARSAL AND PARTIAL CUBOID LEFT FOOT  . HERNIA REPAIR     LEFT INGUINAL AND UMBILICAL REPAIRS  . HERNIA REPAIR    . I & D EXTREMITY Left 04/25/2016   Procedure: EXCISION BASE 5TH METATARSAL AND PARTIAL CUBOID LEFT FOOT;  Surgeon: Newt Minion, MD;  Location: [facility];  Service: Orthopedics;  Laterality: Left;  . ILEOSTOMY  01/13/2018   Procedure: ILEOSTOMY;  Surgeon: Clovis Riley, MD;  Location[facility];  Service: General;;  . ILEOSTOMY CLOSURE N/A 05/21/2018   Procedure: ILEOSTOMY REVERSAL ERAS PATHWAY;  Surgeon: Clovis Riley, MD;  Location: [facility];  Service: General;  Laterality: N/A;  . IR RADIOLOGIST EVAL & MGMT  11/19/2017  . IR RADIOLOGIST EVAL & MGMT  12/03/2017  . IR RADIOLOGIST EVAL & MGMT  12/18/2017  . JOINT REPLACEMENT Right 2001   Total  . LAMINECTOMY     X 3 LUMBAR AND X 2 CERVICAL SPINE OPERATIONS  . LAPAROSCOPIC CHOLECYSTECTOMY W/ CHOLANGIOGRAPHY  11/09/04   SURGEON DR. Luella Cook  . LEFT HEART CATH AND CORONARY ANGIOGRAPHY N/A 09/16/2018   Procedure: LEFT HEART CATH AND CORONARY ANGIOGRAPHY;  Surgeon: Nigel Mormon, MD;  Location: [facility];  Service: Cardiovascular;  Laterality: N/A;  . LEFT HEART CATHETERIZATION WITH CORONARY ANGIOGRAM N/A 10/29/2014   Procedure: LEFT HEART CATHETERIZATION WITH CORONARY ANGIOGRAM;  Surgeon: Laverda Page, MD;  Location[facility];  Service: Cardiovascular;  Laterality: N/A;  . LOWER EXTREMITY ANGIOGRAM N/A 03/19/2012   Procedure: LOWER EXTREMITY ANGIOGRAM;  Surgeon: Burnell Blanks, MD;  Location: [facility];  Service: Cardiovascular;  Laterality: N/A;  . NECK SURGERY    . PARTIAL COLECTOMY N/A 01/13/2018   Procedure: LAPAROSCOPIC ASSISTED   SIGMOID COLECTOMY ILEOSTOMY;  Surgeon: Clovis Riley, MD;  Location[facility];  Service: General;  Laterality: N/A;  . PENILE PROSTHESIS IMPLANT  08/14/05   INFRAPUBIC INSERTION OF INFLATABLE PENILE PROSTHESIS; SURGEON DR. Amalia Hailey  . PENILE PROSTHESIS IMPLANT    . PERCUTANEOUS CORONARY STENT INTERVENTION (PCI-S) Right 10/29/2014   Procedure: PERCUTANEOUS CORONARY STENT INTERVENTION (PCI-S);  Surgeon: Laverda Page, MD;  Location: [facility] CATH LAB;  Service: Cardiovascular;  Laterality: Right;  . SHOULDER  ARTHROSCOPY    . SPINE SURGERY    . TOE AMPUTATION Left   . TONSILLECTOMY    .  TOTAL KNEE ARTHROPLASTY  07/2002   RIGHT KNEE ; SURGEON  DR. [name] ALSO HAD ARTHROSCOPIC RIGHT KNEE IN  10/2001  . TOTAL KNEE ARTHROPLASTY    . ULNAR NERVE TRANSPOSITION Right 10/21/2013   Procedure: RIGHT ELBOW  ULNAR NERVE DECOMPRESSION;  Surgeon: Wynonia Sours, MD;  Location: [facility];  Service: Orthopedics;  Laterality: Right;    FAMILY HISTORY Family History  Problem Relation Age of Onset  . Heart disease Father        Before age 55-  CAD, BPG  . Diabetes Father        Amputation  . Cancer Father        PROSTATE  . Hyperlipidemia Father   . Hypertension Father   . Heart attack Father        Triple BPG  . Varicose Veins Father   . Colon cancer Brother   . Diabetes Brother   . Heart disease Brother 26       A-Fib. Before age 62  . Hyperlipidemia Brother   . Hypertension Brother   . Cancer Sister        Breast  . Hyperlipidemia Sister   . Hypertension Sister   . Hypertension Son   . Arthritis Other        GRANDMOTHER  . Hypertension Other        OTHER FAMILY MEMBERS    SOCIAL HISTORY Social History   Tobacco Use  . Smoking status: Current Every Day Smoker    Packs/day: 2.00    Years: 35.00    Pack years: 70.00    Types: Cigarettes, E-cigarettes    Last attempt to quit: 10/28/2011    Years since quitting: 8.4  . Smokeless tobacco: Never Used  Vaping Use  . Vaping Use: Every day  . Substances: Nicotine  Substance Use Topics  . Alcohol use: Not Currently    Comment: "not in a long time"  . Drug use: Not Currently         OPHTHALMIC EXAM:  Base Eye Exam    Visual Acuity (ETDRS)      Right Left   Dist [place] 20/40 20/20 -2   Dist ph [place] 20/30        Tonometry (Tonopen, 1:58 PM)      Right Left   Pressure 15 17       Pupils      Pupils Dark Light Shape React APD   Right PERRL 5 4 Round Brisk None   Left PERRL 5 4 Round Brisk None       Visual  Fields (Counting fingers)      Left Right    Full Full       Extraocular Movement      Right Left    Full Full       Neuro/Psych    Oriented x3: Yes   Mood/Affect: Normal       Dilation    Left eye: 1.0% Mydriacyl, 2.5% Phenylephrine @ 1:58 PM        Slit Lamp and Fundus Exam    External Exam      Right Left   External Normal Normal       Slit Lamp Exam      Right Left   Lids/Lashes Normal Normal   Conjunctiva/Sclera White and quiet White and quiet   Cornea Clear Clear   Anterior Chamber Deep and quiet Deep and quiet   Iris Round and reactive Round and reactive  Lens Posterior chamber intraocular lens Posterior chamber intraocular lens   Anterior Vitreous Normal Normal       Fundus Exam      Right Left   Posterior Vitreous  Posterior vitreous detachment   Disc  Normal   C/D Ratio  0.25   Macula  Clinically significant macular edema, Exudates, Microaneurysms   Vessels  NPDR-Severe   Periphery  Normal          IMAGING AND PROCEDURES  Imaging and Procedures for 04/18/20  OCT, Retina - OU - Both Eyes       Right Eye Quality was good. Scan locations included subfoveal. Central Foveal Thickness: 265. Progression has improved.   Left Eye Quality was good. Scan locations included subfoveal. Central Foveal Thickness: 283. Progression has improved.   Notes OD, much less CSME   OS much less CSME still active       Intravitreal Injection, Pharmacologic Agent - OS - Left Eye       Time Out 04/18/2020. 2:48 PM. Confirmed correct patient, procedure, site, and patient consented.   Anesthesia Topical anesthesia was used. Anesthetic medications included [place] 3.5%.   Procedure Preparation included Tobramycin 0.3%, 10% betadine to eyelids, 5% betadine to ocular surface. A 30 gauge needle was used.   Injection:  1.25 mg Bevacizumab (AVASTIN) SOLN   NDC: [address]-7, Lot: [id number]   Route: Intravitreal, Site: Left Eye, Waste: 0 mg  Post-op Post  injection exam found visual acuity of at least counting fingers. The patient tolerated the procedure well. There were no complications. The patient received written and verbal post procedure care education. Post injection medications were not given.                 ASSESSMENT/PLAN:  Severe nonproliferative diabetic retinopathy of left eye, with macular edema, associated with type 2 diabetes mellitus (HCC) CSME OS, much improved, currently at 7-week interval.  We will repeat today and examination in 8 weeks.  We will continue to monitor and look to treat peripheral nonperfusion if necessary with PRP  Severe nonproliferative diabetic retinopathy of right eye, with macular edema, associated with type 2 diabetes mellitus (HCC) OD, CSME has improved      ICD-10-CM   1. Severe nonproliferative diabetic retinopathy of left eye, with macular edema, associated with type 2 diabetes mellitus (HCC)  O13.0865 OCT, Retina - OU - Both Eyes    Intravitreal Injection, Pharmacologic Agent - OS - Left Eye    Bevacizumab (AVASTIN) SOLN 1.25 mg  2. Severe nonproliferative diabetic retinopathy of right eye, with macular edema, associated with type 2 diabetes mellitus ([place])  E11.3411     1.  Intravitreal Avastin OS today at 7-week interval.  2.  Repeat examination both eyes in 8 weeks possible Avastin left eye  3.  Examination OD as scheduled  Ophthalmic Meds Ordered this visit:  Meds ordered this encounter  Medications  . Bevacizumab (AVASTIN) SOLN 1.25 mg       Return in about 8 weeks (around 06/13/2020) for DILATE OU, AVASTIN OCT, OS.  There are no Patient Instructions on file for this visit.   Explained the diagnoses, plan, and follow up with the patient and they expressed understanding.  Patient expressed understanding of the importance of proper follow up care.   Clent Demark Shardea Cwynar M.D. Diseases & Surgery of the Retina and Vitreous Retina & Diabetic [place] 04/18/20     Abbreviations: M myopia (nearsighted); A astigmatism; H hyperopia (farsighted); P presbyopia; Mrx spectacle  prescription;  CTL contact lenses; OD right eye; OS left eye; OU both eyes  XT exotropia; ET esotropia; PEK punctate epithelial keratitis; PEE punctate epithelial erosions; DES dry eye syndrome; MGD meibomian gland dysfunction; ATs artificial tears; PFAT's preservative free artificial tears; [place] nuclear sclerotic cataract; PSC posterior subcapsular cataract; ERM epi-retinal membrane; PVD posterior vitreous detachment; RD retinal detachment; DM diabetes mellitus; DR diabetic retinopathy; NPDR non-proliferative diabetic retinopathy; PDR proliferative diabetic retinopathy; CSME clinically significant macular edema; DME diabetic macular edema; dbh dot blot hemorrhages; CWS cotton wool spot; POAG primary open angle glaucoma; C/D cup-to-disc ratio; HVF humphrey visual field; GVF goldmann visual field; OCT optical coherence tomography; IOP intraocular pressure; BRVO Branch retinal vein occlusion; CRVO central retinal vein occlusion; CRAO central retinal artery occlusion; BRAO branch retinal artery occlusion; RT retinal tear; SB scleral buckle; PPV pars plana vitrectomy; VH Vitreous hemorrhage; PRP panretinal laser photocoagulation; IVK intravitreal kenalog; VMT vitreomacular traction; MH Macular hole;  NVD neovascularization of the disc; NVE neovascularization elsewhere; AREDS age related eye disease study; ARMD age related macular degeneration; POAG primary open angle glaucoma; EBMD epithelial/anterior basement membrane dystrophy; ACIOL anterior chamber intraocular lens; IOL intraocular lens; PCIOL posterior chamber intraocular lens; Phaco/IOL phacoemulsification with intraocular lens placement; [place] photorefractive keratectomy; LASIK laser assisted in situ keratomileusis; HTN hypertension; DM diabetes mellitus; COPD chronic obstructive pulmonary disease

## 2020-04-18 NOTE — Assessment & Plan Note (Signed)
CSME OS, much improved, currently at 7-week interval.  We will repeat today and examination in 8 weeks.  We will continue to monitor and look to treat peripheral nonperfusion if necessary with PRP

## 2020-04-18 NOTE — Assessment & Plan Note (Signed)
OD, CSME has improved

## 2020-04-25 ENCOUNTER — Other Ambulatory Visit: Payer: Self-pay

## 2020-04-25 ENCOUNTER — Encounter (HOSPITAL_COMMUNITY)
Admission: RE | Admit: 2020-04-25 | Discharge: 2020-04-25 | Disposition: A | Discharge status: Home / Self Care | Payer: 59 | Source: Ambulatory Visit | Attending: Nephrology | Admitting: Nephrology

## 2020-04-25 ENCOUNTER — Encounter (INDEPENDENT_AMBULATORY_CARE_PROVIDER_SITE_OTHER): Payer: Self-pay | Admitting: Ophthalmology

## 2020-04-25 ENCOUNTER — Ambulatory Visit (INDEPENDENT_AMBULATORY_CARE_PROVIDER_SITE_OTHER): Payer: 59 | Admitting: Ophthalmology

## 2020-04-25 DIAGNOSIS — I6523 Occlusion and stenosis of bilateral carotid arteries: Secondary | ICD-10-CM

## 2020-04-25 DIAGNOSIS — E113411 Type 2 diabetes mellitus with severe nonproliferative diabetic retinopathy with macular edema, right eye: Secondary | ICD-10-CM | POA: Diagnosis not present

## 2020-04-25 DIAGNOSIS — N183 Chronic kidney disease, stage 3 unspecified: Secondary | ICD-10-CM | POA: Diagnosis not present

## 2020-04-25 LAB — FERRITIN: Ferritin: 178 ng/mL (ref 24–336)

## 2020-04-25 LAB — IRON AND TIBC
Iron: 51 ug/dL (ref 45–182)
Saturation Ratios: 19 % (ref 17.9–39.5)
TIBC: 262 ug/dL (ref 250–450)
UIBC: 211 ug/dL

## 2020-04-25 MED ORDER — AFLIBERCEPT 2MG/0.05ML IZ SOLN FOR KALEIDOSCOPE
2.0000 mg | INTRAVITREAL | Status: AC | PRN
  Administered 2020-04-25: 2 mg via INTRAVITREAL

## 2020-04-25 MED ORDER — EPOETIN ALFA-EPBX 10000 UNIT/ML IJ SOLN
INTRAMUSCULAR | Status: AC
  Administered 2020-04-25: 20000 [IU]
  Filled 2020-04-25: qty 2

## 2020-04-25 NOTE — Assessment & Plan Note (Signed)
The nature of diabetic macular edema was discussed with the patient. Treatment options were outlined including medical therapy, laser & vitrectomy. The use of injectable medications reviewed, including Avastin, Lucentis, and Eylea. Periodic injections into the eye are likely to resolve diabetic macular edema (swelling in the center of vision). Initially, injections are delivered are delivered every 4-6 weeks, and the interval extended as the condition improves. On average, 8-9 injections the first year, and 5 in year 2. Improvement in the condition most often improves on medical therapy. Occasional use of focal laser is also recommended for residual macular edema (swelling). Excellent control of blood glucose and blood pressure are encouraged under the care of a primary physician or endocrinologist. Similarly, attempts to maintain serum cholesterol, low density lipoproteins, and high-density lipoproteins in a favorable range were recommended.   Deep present but yet much less active, will repeat intravitreal Eylea today currently 7-week interval and examination in 8 weeks

## 2020-04-25 NOTE — Patient Instructions (Signed)
Patient to report to the office immediately if new onset visual distortion or vision decline

## 2020-04-25 NOTE — Progress Notes (Signed)
04/25/2020     CHIEF COMPLAINT Patient presents for Retina Follow Up   HISTORY OF PRESENT ILLNESS: [name] is a 64 y.o. male who presents to the clinic today for:   HPI    Retina Follow Up    Patient presents with  Diabetic Retinopathy.  In right eye.  This started 7 weeks ago.  Severity is mild.  Duration of 7 weeks.  Since onset it is stable.          Comments    7 Week Diabetic F/U OD, poss Eylea OD  Pt denies noticeable changes to [place] OU since last visit. Pt denies ocular pain, flashes of light, or floaters OU. Pt c/o constant "white mucous" OU.       Last edited by Rockie Neighbours, [place] on 04/25/2020  2:18 PM. (History)      Referring physician: [name], PA-C [place],  [place] [address]  HISTORICAL INFORMATION:   Selected notes from the MEDICAL RECORD NUMBER    Lab Results  Component Value Date   HGBA1C 5.7 (H) 09/16/2018     CURRENT MEDICATIONS: Current Outpatient Medications (Ophthalmic Drugs)  Medication Sig  . Polyvinyl Alcohol-Povidone PF (REFRESH) 1.4-0.6 % SOLN Place 1 drop into both eyes daily.   No current facility-administered medications for this visit. (Ophthalmic Drugs)   Current Outpatient Medications (Other)  Medication Sig  . amLODipine (NORVASC) 10 MG tablet Take 1 tablet (10 mg total) by mouth daily.  Marland Kitchen aspirin EC 81 MG tablet Take 81 mg by mouth daily.  Marland Kitchen atorvastatin (LIPITOR) 40 MG tablet Take 40 mg by mouth daily.  . carvedilol (COREG) 25 MG tablet Take 1.5 tablets (37.5 mg total) by mouth 2 (two) times daily.  . clobetasol (TEMOVATE) 0.05 % external solution Apply 1 application topically daily as needed (dry scalp).  . clopidogrel (PLAVIX) 75 MG tablet Take 75 mg by mouth daily.   . clotrimazole-betamethasone (LOTRISONE) cream Apply 1 application topically as needed (on affected areas on skin).  . collagenase (SANTYL) ointment Apply topically daily. (Patient taking differently: Apply 1 application topically  daily as needed (wound care). TO AFFECTED SITE(S))  . doxycycline (VIBRA-TABS) 100 MG tablet Take 1 tablet (100 mg total) by mouth 2 (two) times daily.  . fentaNYL (DURAGESIC - DOSED MCG/HR) 50 MCG/HR Place 50 mcg onto the skin every 3 (three) days.  . ferrous sulfate 325 (65 FE) MG tablet Take 325 mg by mouth daily with breakfast.  . fluticasone (CUTIVATE) 0.05 % cream Apply 1 application topically 2 (two) times daily as needed (dry skin on face).   . folic acid (FOLVITE) 233 MCG tablet Take 800 mcg by mouth daily.   . Glucosamine HCl (GLUCOSAMINE PO) Take 2,000 mg by mouth daily.   Marland Kitchen HUMALOG KWIKPEN 100 UNIT/ML KiwkPen Inject 15 Units into the skin 3 (three) times daily with meals. 15 units with breakfast, lunch, & dinner-- pt may increase dose by 1 unit if sugar is elevated  . ketoconazole (NIZORAL) 2 % cream Apply 1 application topically 2 (two) times daily.  Marland Kitchen linaclotide (LINZESS) 145 MCG CAPS capsule Take 145 mcg by mouth daily before breakfast.  . Multiple Vitamin (MULTIVITAMIN) tablet Take 1 tablet by mouth daily.  Marland Kitchen NEEDLE, REUSABLE, 22 G 22G X 1-1/2" MISC 1 Units by Does not apply route as directed. For B12 IM inj  . omega-3 acid ethyl esters (LOVAZA) 1 g capsule Take 2 g by mouth 2 (two) times daily.  Marland Kitchen  ondansetron (ZOFRAN) 4 MG tablet Take 4 mg by mouth every 6 (six) hours as needed for nausea or vomiting.  Marland Kitchen oxyCODONE-acetaminophen (PERCOCET/ROXICET) 5-325 MG tablet Take 1 tablet by mouth every 8 (eight) hours as needed. (Patient taking differently: Take 1 tablet by mouth 3 (three) times daily. )  . pantoprazole (PROTONIX) 40 MG tablet Take 40 mg by mouth daily.  . pregabalin (LYRICA) 100 MG capsule Take 1 capsule (100 mg total) by mouth 3 (three) times daily.  Marland Kitchen rOPINIRole (REQUIP) 2 MG tablet Take 2 mg by mouth at bedtime.   . Syringe/Needle, Disp, (SYRINGE 3CC/22GX1-1/2") 22G X 1-1/2" 3 ML MISC 1 Syringe by Does not apply route as directed. For b12 IM inj  . tamsulosin (FLOMAX)  0.4 MG CAPS capsule Take 0.4 mg by mouth daily.  Marland Kitchen torsemide (DEMADEX) 20 MG tablet Take 20 mg by mouth daily. May take an additional tablet for increased swelling.   No current facility-administered medications for this visit. (Other)      REVIEW OF SYSTEMS:    ALLERGIES Allergies  Allergen Reactions  . Contrast Media [Iodinated Diagnostic Agents]     Difficulty breathing    . Ivp Dye [Iodinated Diagnostic Agents] Anaphylaxis and Other (See Comments)    Breathing problems   . Adhesive [Tape] Rash    Rash after 2-3 days use    PAST MEDICAL HISTORY Past Medical History:  Diagnosis Date  . Carotid artery occlusion 11/10/10   LEFT CAROTID ENDARTERECTOMY  . Complication of anesthesia    BP WENT UP AT [facility] "  . COPD (chronic obstructive pulmonary disease) ([place])   . Diverticulitis   . Diverticulosis of colon (without mention of hemorrhage)   . DJD (degenerative joint disease)   . Fatty liver   . Full dentures   . GERD (gastroesophageal reflux disease)   . H/O hiatal hernia   . Hyperlipidemia   . Hypertension   . Neuromuscular disorder ([place])    peripheral neuropathy  . Non-pressure chronic ulcer of other part of left foot limited to breakdown of skin ([place]) 11/12/2016  . Osteomyelitis ([place])    left 5th metatarsal  . PAD (peripheral artery disease) ([place])    Distal aortogram June 2012. Atherectomy left popliteal artery July 2012.   . Pseudoclaudication 11/15/2018  . Slurred speech    AS PER WIFE IN D/C NOTE 11/10/10  . 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 Unstable angina ([place]) 09/16/2018  . Wears glasses    Past Surgical History:  Procedure Laterality Date  . AMPUTATION  11/05/2011   Procedure: AMPUTATION RAY;  Surgeon: Wylene Simmer, MD;  Location: [place];  Service: Orthopedics;  Laterality: Right;  Amputation of Right 4&5th Toes  . AMPUTATION Left 11/26/2012   Procedure: AMPUTATION RAY;  Surgeon: Wylene Simmer, MD;  Location: [place];  Service: Orthopedics;  Laterality: Left;  fourth  ray amputation  . AMPUTATION Right 08/27/2014   Procedure: Transmetatarsal Amputation;  Surgeon: Newt Minion, MD;  Location: [facility];  Service: Orthopedics;  Laterality: Right;  . AMPUTATION Right 01/14/2015   Procedure: AMPUTATION BELOW KNEE;  Surgeon: Newt Minion, MD;  Location: [facility];  Service: Orthopedics;  Laterality: Right;  . AMPUTATION Left 10/21/2015   Procedure: Left Foot 5th Ray Amputation;  Surgeon: Newt Minion, MD;  Location: [facility] [facility];  Service: Orthopedics;  Laterality: Left;  . ANTERIOR FUSION CERVICAL SPINE  02/06/06   C4-5, C5-6, C6-7; SURGEON DR. MAX COHEN  . BACK SURGERY    . BELOW  KNEE LEG AMPUTATION Right   . CARDIAC CATHETERIZATION  10/31/04   2009  . CAROTID ENDARTERECTOMY  11/10/10  . CAROTID ENDARTERECTOMY Left 11/10/2010   Subtotal occlusion of left internal carotid artery with left hemispheric transient ischemic attacks.  . CAROTID STENT    . CARPAL TUNNEL RELEASE Right 10/21/2013   Procedure: RIGHT CARPAL TUNNEL RELEASE;  Surgeon: Wynonia Sours, MD;  Location: [facility];  Service: Orthopedics;  Laterality: Right;  . CHOLECYSTECTOMY    . COLON SURGERY    . COLONOSCOPY    . COLOSTOMY REVERSAL  05/21/2018   ileostomy reversal  . CYSTOSCOPY WITH STENT PLACEMENT Bilateral 01/13/2018   Procedure: CYSTOSCOPY WITH BILATERAL URETERAL CATHETER PLACEMENT;  Surgeon: Ardis Hughs, MD;  Location: [facility];  Service: Urology;  Laterality: Bilateral;  . ESOPHAGEAL MANOMETRY Bilateral 07/19/2014   Procedure: ESOPHAGEAL MANOMETRY (EM);  Surgeon: Jerene Bears, MD;  [facility];  Service: Gastroenterology;  Laterality: Bilateral;  . FEMORAL ARTERY STENT     x6  . FINGER SURGERY    . FOOT SURGERY  04/25/2016    EXCISION BASE 5TH METATARSAL AND PARTIAL CUBOID LEFT FOOT  . HERNIA REPAIR     LEFT INGUINAL AND UMBILICAL REPAIRS  . HERNIA REPAIR    . I & D EXTREMITY Left 04/25/2016   Procedure: EXCISION BASE 5TH METATARSAL AND PARTIAL CUBOID LEFT FOOT;   Surgeon: Newt Minion, MD;  Location: [place];  Service: Orthopedics;  Laterality: Left;  . ILEOSTOMY  01/13/2018   Procedure: ILEOSTOMY;  Surgeon: Clovis Riley, MD;  Location: [facility];  Service: General;;  . ILEOSTOMY CLOSURE N/A 05/21/2018   Procedure: ILEOSTOMY REVERSAL ERAS PATHWAY;  Surgeon: Clovis Riley, MD;  Location: [facility];  Service: General;  Laterality: N/A;  . IR RADIOLOGIST EVAL & MGMT  11/19/2017  . IR RADIOLOGIST EVAL & MGMT  12/03/2017  . IR RADIOLOGIST EVAL & MGMT  12/18/2017  . JOINT REPLACEMENT Right 2001   Total  . LAMINECTOMY     X 3 LUMBAR AND X 2 CERVICAL SPINE OPERATIONS  . LAPAROSCOPIC CHOLECYSTECTOMY W/ CHOLANGIOGRAPHY  11/09/04   SURGEON DR. Luella Cook  . LEFT HEART CATH AND CORONARY ANGIOGRAPHY N/A 09/16/2018   Procedure: LEFT HEART CATH AND CORONARY ANGIOGRAPHY;  Surgeon: Nigel Mormon, MD;  Location: [facility];  Service: Cardiovascular;  Laterality: N/A;  . LEFT HEART CATHETERIZATION WITH CORONARY ANGIOGRAM N/A 10/29/2014   Procedure: LEFT HEART CATHETERIZATION WITH CORONARY ANGIOGRAM;  Surgeon: Laverda Page, MD;  Location[facility];  Service: Cardiovascular;  Laterality: N/A;  . LOWER EXTREMITY ANGIOGRAM N/A 03/19/2012   Procedure: LOWER EXTREMITY ANGIOGRAM;  Surgeon: Burnell Blanks, MD;  Location: [facility] C[facility] LAB;  Service: Cardiovascular;  Laterality: N/A;  . NECK SURGERY    . PARTIAL COLECTOMY N/A 01/13/2018   Procedure: LAPAROSCOPIC ASSISTED   SIGMOID COLECTOMY ILEOSTOMY;  Surgeon: Clovis Riley, MD;  Location: [facility];  Service: General;  Laterality: N/A;  . PENILE PROSTHESIS IMPLANT  08/14/05   INFRAPUBIC INSERTION OF INFLATABLE PENILE PROSTHESIS; SURGEON DR. Amalia Hailey  . PENILE PROSTHESIS IMPLANT    . PERCUTANEOUS CORONARY STENT INTERVENTION (PCI-S) Right 10/29/2014   Procedure: PERCUTANEOUS CORONARY STENT INTERVENTION (PCI-S);  Surgeon: Laverda Page, MD;  Location[facility] LAB;  Service: Cardiovascular;  Laterality: Right;    . SHOULDER ARTHROSCOPY    . SPINE SURGERY    . TOE AMPUTATION Left   . TONSILLECTOMY    .  TOTAL KNEE ARTHROPLASTY  07/2002   RIGHT KNEE ; SURGEON  DR. [name] ALSO HAD ARTHROSCOPIC RIGHT KNEE IN  10/2001  . TOTAL KNEE ARTHROPLASTY    . ULNAR NERVE TRANSPOSITION Right 10/21/2013   Procedure: RIGHT ELBOW  ULNAR NERVE DECOMPRESSION;  Surgeon: Wynonia Sours, MD;  Location: [place];  Service: Orthopedics;  Laterality: Right;    FAMILY HISTORY Family History  Problem Relation Age of Onset  . Heart disease Father        Before age 22-  CAD, BPG  . Diabetes Father        Amputation  . Cancer Father        PROSTATE  . Hyperlipidemia Father   . Hypertension Father   . Heart attack Father        Triple BPG  . Varicose Veins Father   . Colon cancer Brother   . Diabetes Brother   . Heart disease Brother 59       A-Fib. Before age 22  . Hyperlipidemia Brother   . Hypertension Brother   . Cancer Sister        Breast  . Hyperlipidemia Sister   . Hypertension Sister   . Hypertension Son   . Arthritis Other        GRANDMOTHER  . Hypertension Other        OTHER FAMILY MEMBERS    SOCIAL HISTORY Social History   Tobacco Use  . Smoking status: Current Every Day Smoker    Packs/day: 2.00    Years: 35.00    Pack years: 70.00    Types: Cigarettes, E-cigarettes    Last attempt to quit: 10/28/2011    Years since quitting: 8.4  . Smokeless tobacco: Never Used  Vaping Use  . Vaping Use: Every day  . Substances: Nicotine  Substance Use Topics  . Alcohol use: Not Currently    Comment: "not in a long time"  . Drug use: Not Currently         OPHTHALMIC EXAM:  Base Eye Exam    Visual Acuity (ETDRS)      Right Left   Dist [place] 20/40 -2 20/25 +2   Dist ph [place] 20/40 +1        Tonometry (Tonopen, 2:22 PM)      Right Left   Pressure 12 14       Pupils      Pupils Dark Light Shape React APD   Right PERRL 5 4 Round Brisk None   Left PERRL 5 4 Round Brisk None        Visual Fields (Counting fingers)      Left Right    Full Full       Extraocular Movement      Right Left    Full Full       Neuro/Psych    Oriented x3: Yes   Mood/Affect: Normal       Dilation    Right eye: 1.0% Mydriacyl, 2.5% Phenylephrine @ 2:23 PM        Slit Lamp and Fundus Exam    External Exam      Right Left   External Normal Normal       Slit Lamp Exam      Right Left   Lids/Lashes Normal Normal   Conjunctiva/Sclera White and quiet White and quiet   Cornea Clear Clear   Anterior Chamber Deep and quiet Deep and quiet   Iris Round and reactive Round and  reactive   Lens Posterior chamber intraocular lens Posterior chamber intraocular lens   Anterior Vitreous Normal Normal       Fundus Exam      Right Left   Posterior Vitreous Normal    Disc Normal    C/D Ratio 0.4    Macula Exudates, Microaneurysms, Mild clinically significant macular edema    Vessels NPDR severe    Periphery Normal           IMAGING AND PROCEDURES  Imaging and Procedures for 04/25/20  OCT, Retina - OU - Both Eyes       Right Eye Quality was good. Scan locations included subfoveal. Central Foveal Thickness: 272. Progression has improved.   Left Eye Quality was good. Scan locations included subfoveal. Central Foveal Thickness: 280. Progression has improved.   Notes OD with much less CSME, on intravitreal Eylea at interval of 7 weeks.  Will extend interval examination for the right eye to 8 weeks next       Intravitreal Injection, Pharmacologic Agent - OD - Right Eye       Time Out 04/25/2020. 3:22 PM. Confirmed correct patient, procedure, site, and patient consented.   Anesthesia Topical anesthesia was used. Anesthetic medications included [place] 3.5%.   Procedure Preparation included 5% betadine to ocular surface, 10% betadine to eyelids. A 30 gauge needle was used.   Injection:  2 mg aflibercept Alfonse Flavors) SOLN   NDC: [id number], Lot: [id number]   Route:  Intravitreal, Site: Right Eye, Waste: 0 mg  Post-op Post injection exam found visual acuity of at least counting fingers. The patient tolerated the procedure well. There were no complications. The patient received written and verbal post procedure care education. Post injection medications were not given.                 ASSESSMENT/PLAN:  Severe nonproliferative diabetic retinopathy of right eye, with macular edema, associated with type 2 diabetes mellitus ([place])  The nature of diabetic macular edema was discussed with the patient. Treatment options were outlined including medical therapy, laser & vitrectomy. The use of injectable medications reviewed, including Avastin, Lucentis, and Eylea. Periodic injections into the eye are likely to resolve diabetic macular edema (swelling in the center of vision). Initially, injections are delivered are delivered every 4-6 weeks, and the interval extended as the condition improves. On average, 8-9 injections the first year, and 5 in year 2. Improvement in the condition most often improves on medical therapy. Occasional use of focal laser is also recommended for residual macular edema (swelling). Excellent control of blood glucose and blood pressure are encouraged under the care of a primary physician or endocrinologist. Similarly, attempts to maintain serum cholesterol, low density lipoproteins, and high-density lipoproteins in a favorable range were recommended.   Deep present but yet much less active, will repeat intravitreal Eylea today currently 7-week interval and examination in 8 weeks      ICD-10-CM   1. Severe nonproliferative diabetic retinopathy of right eye, with macular edema, associated with type 2 diabetes mellitus (HCC)  E11.3411 OCT, Retina - OU - Both Eyes    Intravitreal Injection, Pharmacologic Agent - OD - Right Eye    aflibercept (EYLEA) SOLN 2 mg    1.  OD examination today at 7-week interval post Eylea, will repeat injection  today and examination OD in 8 weeks  2.  OS, follow-up examination as scheduled  3.  Ophthalmic Meds Ordered this visit:  Meds ordered this encounter  Medications  . aflibercept (  EYLEA) SOLN 2 mg       Return in about 8 weeks (around 06/20/2020) for dilate, OD, EYLEA OCT.  Patient Instructions  Patient to report to the office immediately if new onset visual distortion or vision decline    Explained the diagnoses, plan, and follow up with the patient and they expressed understanding.  Patient expressed understanding of the importance of proper follow up care.   Clent Demark Amaranta Mehl M.D. Diseases & Surgery of the Retina and Vitreous Retina & Diabetic [place] 04/25/20     Abbreviations: M myopia (nearsighted); A astigmatism; H hyperopia (farsighted); P presbyopia; Mrx spectacle prescription;  CTL contact lenses; OD right eye; OS left eye; OU both eyes  XT exotropia; ET esotropia; PEK punctate epithelial keratitis; PEE punctate epithelial erosions; DES dry eye syndrome; MGD meibomian gland dysfunction; ATs artificial tears; PFAT's preservative free artificial tears; [place] nuclear sclerotic cataract; PSC posterior subcapsular cataract; ERM epi-retinal membrane; PVD posterior vitreous detachment; RD retinal detachment; DM diabetes mellitus; DR diabetic retinopathy; NPDR non-proliferative diabetic retinopathy; PDR proliferative diabetic retinopathy; CSME clinically significant macular edema; DME diabetic macular edema; dbh dot blot hemorrhages; CWS cotton wool spot; POAG primary open angle glaucoma; C/D cup-to-disc ratio; HVF humphrey visual field; GVF goldmann visual field; OCT optical coherence tomography; IOP intraocular pressure; BRVO Branch retinal vein occlusion; CRVO central retinal vein occlusion; CRAO central retinal artery occlusion; BRAO branch retinal artery occlusion; RT retinal tear; SB scleral buckle; PPV pars plana vitrectomy; VH Vitreous hemorrhage; PRP panretinal laser  photocoagulation; IVK intravitreal kenalog; VMT vitreomacular traction; MH Macular hole;  NVD neovascularization of the disc; NVE neovascularization elsewhere; AREDS age related eye disease study; ARMD age related macular degeneration; POAG primary open angle glaucoma; EBMD epithelial/anterior basement membrane dystrophy; ACIOL anterior chamber intraocular lens; IOL intraocular lens; PCIOL posterior chamber intraocular lens; Phaco/IOL phacoemulsification with intraocular lens placement; [place] photorefractive keratectomy; LASIK laser assisted in situ keratomileusis; HTN hypertension; DM diabetes mellitus; COPD chronic obstructive pulmonary disease

## 2020-04-26 LAB — POCT HEMOGLOBIN-HEMACUE: Hemoglobin: 10.8 g/dL — ABNORMAL LOW (ref 13.0–17.0)

## 2020-05-09 ENCOUNTER — Other Ambulatory Visit: Payer: Self-pay

## 2020-05-09 ENCOUNTER — Encounter (HOSPITAL_COMMUNITY)
Admission: RE | Admit: 2020-05-09 | Discharge: 2020-05-09 | Disposition: A | Discharge status: Home / Self Care | Payer: 59 | Source: Ambulatory Visit | Attending: Nephrology | Admitting: Nephrology

## 2020-05-09 DIAGNOSIS — N183 Chronic kidney disease, stage 3 unspecified: Secondary | ICD-10-CM | POA: Diagnosis not present

## 2020-05-09 MED ORDER — EPOETIN ALFA-EPBX 10000 UNIT/ML IJ SOLN
INTRAMUSCULAR | Status: AC
  Filled 2020-05-09: qty 2

## 2020-05-09 MED ORDER — EPOETIN ALFA-EPBX 10000 UNIT/ML IJ SOLN
20000.0000 [IU] | INTRAMUSCULAR | Status: DC
  Administered 2020-05-09: 20000 [IU] via SUBCUTANEOUS

## 2020-05-10 LAB — POCT HEMOGLOBIN-HEMACUE: Hemoglobin: 11.3 g/dL — ABNORMAL LOW (ref 13.0–17.0)

## 2020-05-20 ENCOUNTER — Other Ambulatory Visit (HOSPITAL_COMMUNITY): Payer: Self-pay | Admitting: *Deleted

## 2020-05-23 ENCOUNTER — Encounter (HOSPITAL_COMMUNITY)
Admission: RE | Admit: 2020-05-23 | Discharge: 2020-05-23 | Disposition: A | Discharge status: Home / Self Care | Payer: 59 | Source: Ambulatory Visit | Attending: Nephrology | Admitting: Nephrology

## 2020-05-23 ENCOUNTER — Other Ambulatory Visit: Payer: Self-pay

## 2020-05-23 DIAGNOSIS — N189 Chronic kidney disease, unspecified: Secondary | ICD-10-CM | POA: Insufficient documentation

## 2020-05-23 DIAGNOSIS — D631 Anemia in chronic kidney disease: Secondary | ICD-10-CM | POA: Diagnosis not present

## 2020-05-23 LAB — IRON AND TIBC
Iron: 70 ug/dL (ref 45–182)
Saturation Ratios: 32 % (ref 17.9–39.5)
TIBC: 221 ug/dL — ABNORMAL LOW (ref 250–450)
UIBC: 151 ug/dL

## 2020-05-23 LAB — FERRITIN: Ferritin: 225 ng/mL (ref 24–336)

## 2020-05-23 LAB — POCT HEMOGLOBIN-HEMACUE: Hemoglobin: 10.8 g/dL — ABNORMAL LOW (ref 13.0–17.0)

## 2020-05-23 MED ORDER — EPOETIN ALFA-EPBX 10000 UNIT/ML IJ SOLN
INTRAMUSCULAR | Status: AC
  Filled 2020-05-23: qty 2

## 2020-05-23 MED ORDER — EPOETIN ALFA-EPBX 10000 UNIT/ML IJ SOLN
20000.0000 [IU] | INTRAMUSCULAR | Status: DC
  Administered 2020-05-23: 20000 [IU] via SUBCUTANEOUS

## 2020-05-24 ENCOUNTER — Other Ambulatory Visit: Payer: Self-pay | Admitting: *Deleted

## 2020-05-24 DIAGNOSIS — N186 End stage renal disease: Secondary | ICD-10-CM

## 2020-05-25 ENCOUNTER — Ambulatory Visit (INDEPENDENT_AMBULATORY_CARE_PROVIDER_SITE_OTHER): Payer: 59 | Admitting: Vascular Surgery

## 2020-05-25 ENCOUNTER — Ambulatory Visit (HOSPITAL_COMMUNITY)
Admission: RE | Admit: 2020-05-25 | Discharge: 2020-05-25 | Disposition: A | Discharge status: Home / Self Care | Payer: 59 | Source: Ambulatory Visit | Attending: Vascular Surgery | Admitting: Vascular Surgery

## 2020-05-25 ENCOUNTER — Other Ambulatory Visit: Payer: Self-pay | Admitting: *Deleted

## 2020-05-25 ENCOUNTER — Ambulatory Visit (INDEPENDENT_AMBULATORY_CARE_PROVIDER_SITE_OTHER)
Admission: RE | Admit: 2020-05-25 | Discharge: 2020-05-25 | Disposition: A | Discharge status: Home / Self Care | Payer: 59 | Source: Ambulatory Visit | Attending: Vascular Surgery | Admitting: Vascular Surgery

## 2020-05-25 ENCOUNTER — Other Ambulatory Visit: Payer: Self-pay

## 2020-05-25 ENCOUNTER — Encounter: Payer: Self-pay | Admitting: Vascular Surgery

## 2020-05-25 ENCOUNTER — Encounter: Payer: Self-pay | Admitting: *Deleted

## 2020-05-25 VITALS — BP 148/64 | HR 71 | Temp 99.2°F | Resp 20 | Ht 74.0 in | Wt 288.0 lb

## 2020-05-25 DIAGNOSIS — N186 End stage renal disease: Secondary | ICD-10-CM | POA: Insufficient documentation

## 2020-05-25 DIAGNOSIS — N189 Chronic kidney disease, unspecified: Secondary | ICD-10-CM | POA: Diagnosis not present

## 2020-05-25 NOTE — Progress Notes (Signed)
Patient ID: [name], male   DOB: 1956/08/19, 64 y.o.   MRN: [id number]  Reason for Consult: New Patient (Initial Visit)   Referred by [name], PA-C  Subjective:     HPI:  [name] is a 64 y.o. male with chronic kidney disease.  He has never been on dialysis.  He does have coronary stents does not have any previous open heart surgery does not have any chest breast or upper extremity surgery other than his right shoulder.  He is right-hand dominant that is a stronger hand.  Has a previous right lower extremity amputation which is well-healed also has stents in his left lower extremity.  Past Medical History:  Diagnosis Date   Carotid artery occlusion 11/10/10   LEFT CAROTID ENDARTERECTOMY   Complication of anesthesia    BP WENT UP AT [facility] "   COPD (chronic obstructive pulmonary disease) (HCC)    Diverticulitis    Diverticulosis of colon (without mention of hemorrhage)    DJD (degenerative joint disease)    Fatty liver    Full dentures    GERD (gastroesophageal reflux disease)    H/O hiatal hernia    Hyperlipidemia    Hypertension    Neuromuscular disorder ([place])    peripheral neuropathy   Non-pressure chronic ulcer of other part of left foot limited to breakdown of skin ([place]) 11/12/2016   Osteomyelitis (HCC)    left 5th metatarsal   PAD (peripheral artery disease) ([place])    Distal aortogram June 2012. Atherectomy left popliteal artery July 2012.    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 Before age 57   Hyperlipidemia Brother    Hypertension Brother    Cancer Sister        Breast   Hyperlipidemia Sister    Hypertension Sister    Hypertension Son    Arthritis Other        GRANDMOTHER   Hypertension Other        OTHER FAMILY MEMBERS   Past Surgical History:  Procedure Laterality Date   AMPUTATION  11/05/2011   Procedure: AMPUTATION RAY;  Surgeon: Wylene Simmer, MD;  Location: [place];  Service: Orthopedics;  Laterality: Right;  Amputation of Right 4&5th Toes   AMPUTATION Left 11/26/2012   Procedure: AMPUTATION RAY;  Surgeon: Wylene Simmer, MD;  Location: [place];  Service: Orthopedics;  Laterality: Left;  fourth ray amputation   AMPUTATION Right 08/27/2014   Procedure: Transmetatarsal Amputation;  Surgeon: Newt Minion, MD;  Location: [place];  Service: Orthopedics;  Laterality: Right;   AMPUTATION Right 01/14/2015   Procedure: AMPUTATION BELOW KNEE;  Surgeon: Newt Minion, MD;  Location: [place];  Service: Orthopedics;  Laterality: Right;  AMPUTATION Left 10/21/2015   Procedure: Left Foot 5th Ray Amputation;  Surgeon: Newt Minion, MD;  Location: [place];  Service: Orthopedics;  Laterality: Left;   ANTERIOR FUSION CERVICAL SPINE  02/06/06   C4-5, C5-6, C6-7; SURGEON DR. MAX COHEN   BACK SURGERY     BELOW KNEE LEG AMPUTATION Right    CARDIAC CATHETERIZATION  10/31/04   2009   CAROTID ENDARTERECTOMY  11/10/10   CAROTID ENDARTERECTOMY Left 11/10/2010   Subtotal occlusion of left internal carotid artery with left hemispheric transient ischemic attacks.   CAROTID STENT     CARPAL TUNNEL RELEASE Right 10/21/2013   Procedure: RIGHT CARPAL TUNNEL RELEASE;  Surgeon: Wynonia Sours, MD;  Location: [place];  Service: Orthopedics;  Laterality: Right;   CHOLECYSTECTOMY     COLON SURGERY     COLONOSCOPY     COLOSTOMY REVERSAL  05/21/2018   ileostomy reversal   CYSTOSCOPY WITH STENT PLACEMENT Bilateral  01/13/2018   Procedure: CYSTOSCOPY WITH BILATERAL URETERAL CATHETER PLACEMENT;  Surgeon: Ardis Hughs, MD;  Location: [facility];  Service: Urology;  Laterality: Bilateral;   ESOPHAGEAL MANOMETRY Bilateral 07/19/2014   Procedure: ESOPHAGEAL MANOMETRY (EM);  Surgeon: Jerene Bears, MD;  Location: [facility];  Service: Gastroenterology;  Laterality: Bilateral;   FEMORAL ARTERY STENT     x6   FINGER SURGERY     FOOT SURGERY  04/25/2016    EXCISION BASE 5TH METATARSAL AND PARTIAL CUBOID LEFT FOOT   HERNIA REPAIR     LEFT INGUINAL AND UMBILICAL REPAIRS   HERNIA REPAIR     I & D EXTREMITY Left 04/25/2016   Procedure: EXCISION BASE 5TH METATARSAL AND PARTIAL CUBOID LEFT FOOT;  Surgeon: Newt Minion, MD;  Location: [address];  Service: Orthopedics;  Laterality: Left;   ILEOSTOMY  01/13/2018   Procedure: ILEOSTOMY;  Surgeon: Clovis Riley, MD;  Location: [facility];  Service: General;;   ILEOSTOMY CLOSURE N/A 05/21/2018   Procedure: ILEOSTOMY REVERSAL ERAS PATHWAY;  Surgeon: Clovis Riley, MD;  Location: [place];  Service: General;  Laterality: N/A;   IR RADIOLOGIST EVAL & MGMT  11/19/2017   IR RADIOLOGIST EVAL & MGMT  12/03/2017   IR RADIOLOGIST EVAL & MGMT  12/18/2017   JOINT REPLACEMENT Right 2001   Total   LAMINECTOMY     X 3 LUMBAR AND X 2 CERVICAL SPINE OPERATIONS   LAPAROSCOPIC CHOLECYSTECTOMY W/ CHOLANGIOGRAPHY  11/09/04   SURGEON DR. Potrero CATH AND CORONARY ANGIOGRAPHY N/A 09/16/2018   Procedure: LEFT HEART CATH AND CORONARY ANGIOGRAPHY;  Surgeon: Nigel Mormon, MD;  Location: [place] [facility];  Service: Cardiovascular;  Laterality: N/A;   LEFT HEART CATHETERIZATION WITH CORONARY ANGIOGRAM N/A 10/29/2014   Procedure: LEFT HEART CATHETERIZATION WITH CORONARY ANGIOGRAM;  Surgeon: Laverda Page, MD;  Location[facility];  Service: Cardiovascular;  Laterality: N/A;   LOWER EXTREMITY ANGIOGRAM N/A 03/19/2012   Procedure: LOWER EXTREMITY ANGIOGRAM;   Surgeon: Burnell Blanks, MD;  Location: [facility] CATH LAB;  Service: Cardiovascular;  Laterality: N/A;   NECK SURGERY     PARTIAL COLECTOMY N/A 01/13/2018   Procedure: LAPAROSCOPIC ASSISTED   SIGMOID COLECTOMY ILEOSTOMY;  Surgeon: Clovis Riley, MD;  Location: [facility];  Service: General;  Laterality: N/A;   PENILE PROSTHESIS IMPLANT  08/14/05   INFRAPUBIC INSERTION OF INFLATABLE PENILE PROSTHESIS; SURGEON DR. Amalia Hailey   PENILE PROSTHESIS IMPLANT     PERCUTANEOUS CORONARY STENT INTERVENTION (  PCI-S) Right 10/29/2014   Procedure: PERCUTANEOUS CORONARY STENT INTERVENTION (PCI-S);  Surgeon: Laverda Page, MD;  Location: [facility];  Service: Cardiovascular;  Laterality: Right;   SHOULDER ARTHROSCOPY     SPINE SURGERY     TOE AMPUTATION Left    TONSILLECTOMY     TOTAL KNEE ARTHROPLASTY  07/2002   RIGHT KNEE ; SURGEON  DR. [name] ALSO HAD ARTHROSCOPIC RIGHT KNEE IN  10/2001   TOTAL KNEE ARTHROPLASTY     ULNAR NERVE TRANSPOSITION Right 10/21/2013   Procedure: RIGHT ELBOW  ULNAR NERVE DECOMPRESSION;  Surgeon: Wynonia Sours, MD;  Location: [place];  Service: Orthopedics;  Laterality: Right;    Short Social History:  Social History   Tobacco Use   Smoking status: Current Every Day Smoker    Packs/day: 2.00    Years: 35.00    Pack years: 70.00    Types: Cigarettes, E-cigarettes    Last attempt to quit: 10/28/2011    Years since quitting: 8.5   Smokeless tobacco: Never Used  Substance Use Topics   Alcohol use: Not Currently    Comment: "not in a long time"    Allergies  Allergen Reactions   Contrast Media [Iodinated Diagnostic Agents]     Difficulty breathing     Ivp Dye [Iodinated Diagnostic Agents] Anaphylaxis and Other (See Comments)    Breathing problems    Adhesive [Tape] Rash    Rash after 2-3 days use    Current Outpatient Medications  Medication Sig Dispense Refill   amLODipine (NORVASC) 10 MG tablet Take 1 tablet (10 mg total) by  mouth daily. 90 tablet 1   aspirin EC 81 MG tablet Take 81 mg by mouth daily.     atorvastatin (LIPITOR) 40 MG tablet Take 40 mg by mouth daily.     carvedilol (COREG) 25 MG tablet Take 1.5 tablets (37.5 mg total) by mouth 2 (two) times daily. 270 tablet 2   clobetasol (TEMOVATE) 0.05 % external solution Apply 1 application topically daily as needed (dry scalp).     clopidogrel (PLAVIX) 75 MG tablet Take 75 mg by mouth daily.      clotrimazole-betamethasone (LOTRISONE) cream Apply 1 application topically as needed (on affected areas on skin).     collagenase (SANTYL) ointment Apply topically daily. (Patient taking differently: Apply 1 application topically daily as needed (wound care). TO AFFECTED SITE(S)) 15 g 0   doxycycline (VIBRA-TABS) 100 MG tablet Take 1 tablet (100 mg total) by mouth 2 (two) times daily. 28 tablet 0   fentaNYL (DURAGESIC - DOSED MCG/HR) 50 MCG/HR Place 50 mcg onto the skin every 3 (three) days.     ferrous sulfate 325 (65 FE) MG tablet Take 325 mg by mouth daily with breakfast.     fluticasone (CUTIVATE) 0.05 % cream Apply 1 application topically 2 (two) times daily as needed (dry skin on face).      folic acid (FOLVITE) 371 MCG tablet Take 800 mcg by mouth daily.      Glucosamine HCl (GLUCOSAMINE PO) Take 2,000 mg by mouth daily.      HUMALOG KWIKPEN 100 UNIT/ML KiwkPen Inject 15 Units into the skin 3 (three) times daily with meals. 15 units with breakfast, lunch, & dinner-- pt may increase dose by 1 unit if sugar is elevated  0   ketoconazole (NIZORAL) 2 % cream Apply 1 application topically 2 (two) times daily.  1   linaclotide (LINZESS) 145 MCG CAPS capsule Take 145 mcg by  mouth daily before breakfast.     Multiple Vitamin (MULTIVITAMIN) tablet Take 1 tablet by mouth daily.     NEEDLE, REUSABLE, 22 G 22G X 1-1/2" MISC 1 Units by Does not apply route as directed. For B12 IM inj 10 each 0   omega-3 acid ethyl esters (LOVAZA) 1 g capsule Take 2 g by  mouth 2 (two) times daily.     ondansetron (ZOFRAN) 4 MG tablet Take 4 mg by mouth every 6 (six) hours as needed for nausea or vomiting.     oxyCODONE-acetaminophen (PERCOCET/ROXICET) 5-325 MG tablet Take 1 tablet by mouth every 8 (eight) hours as needed. (Patient taking differently: Take 1 tablet by mouth 3 (three) times daily. ) 15 tablet 0   pantoprazole (PROTONIX) 40 MG tablet Take 40 mg by mouth daily.     Polyvinyl Alcohol-Povidone PF (REFRESH) 1.4-0.6 % SOLN Place 1 drop into both eyes daily.     pregabalin (LYRICA) 100 MG capsule Take 1 capsule (100 mg total) by mouth 3 (three) times daily. 30 capsule 0   rOPINIRole (REQUIP) 2 MG tablet Take 2 mg by mouth at bedtime.      Syringe/Needle, Disp, (SYRINGE 3CC/22GX1-1/2") 22G X 1-1/2" 3 ML MISC 1 Syringe by Does not apply route as directed. For b12 IM inj 10 each 0   tamsulosin (FLOMAX) 0.4 MG CAPS capsule Take 0.4 mg by mouth daily.     torsemide (DEMADEX) 20 MG tablet Take 20 mg by mouth daily. May take an additional tablet for increased swelling.     No current facility-administered medications for this visit.    Review of Systems  Constitutional:  Constitutional negative. HENT: HENT negative.  Eyes: Eyes negative.  Respiratory: Respiratory negative.  Cardiovascular: Cardiovascular negative.  GI: Gastrointestinal negative.  Musculoskeletal: Positive for gait problem.  Skin: Skin negative.  Hematologic: Hematologic/lymphatic negative.  Psychiatric: Psychiatric negative.        Objective:  Objective   Vitals:   05/25/20 1148  BP: (!) 148/64  Pulse: 71  Resp: 20  Temp: 99.2 F (37.3 C)  SpO2: 96%  Weight: 288 lb (130.6 kg)  Height: 6\' 2"  (1.88 m)   Body mass index is 36.98 kg/m.  Physical Exam HENT:     Head: Normocephalic.  Eyes:     Pupils: Pupils are equal, round, and reactive to light.  Cardiovascular:     Pulses: Normal pulses.          Radial pulses are 2+ on the right side and 2+ on the left  side.  Pulmonary:     Effort: Pulmonary effort is normal.  Abdominal:     General: Abdomen is flat.     Palpations: Abdomen is soft.  Musculoskeletal:     Cervical back: Normal range of motion.     Comments: Right below-knee amputation  Skin:    General: Skin is warm.     Capillary Refill: Capillary refill takes less than 2 seconds.  Neurological:     Mental Status: He is alert.  Psychiatric:        Mood and Affect: Mood normal.        Behavior: Behavior normal.        Thought Content: Thought content normal.        Judgment: Judgment normal.     Data: I have independent interpreted his upper extremity arterial duplex to be triphasic waveforms throughout brachial artery on the right 0.66 cm left 0.54 cm.  I have also independently interpreted his  upper extremity vein mapping which appears to have suitable basilic and cephalic veins from the wrist up.       Assessment/Plan:     64 year old male with chronic kidney disease now here for permanent dialysis discussion.  We discussed his options being catheter, fistula, graft with fistula being preferred.  He does appear to have suitable veins in his nondominant left upper extremity.  He is possibly a candidate for left radial cephalic fistula otherwise we will consider upper arm cephalic or basilic vein fistula.  We discussed the risk and benefits including with his wife on the telephone and they demonstrate good understanding.     Waynetta Sandy MD [facility]

## 2020-05-25 NOTE — H&P (View-Only) (Signed)
Patient ID: [name], male   DOB: 1956/09/02, 64 y.o.   MRN: [id number]  Reason for Consult: New Patient (Initial Visit)   Referred by [name], PA-C  Subjective:     HPI:  [name] is a 64 y.o. male with chronic kidney disease.  He has never been on dialysis.  He does have coronary stents does not have any previous open heart surgery does not have any chest breast or upper extremity surgery other than his right shoulder.  He is right-hand dominant that is a stronger hand.  Has a previous right lower extremity amputation which is well-healed also has stents in his left lower extremity.  Past Medical History:  Diagnosis Date  . Carotid artery occlusion 11/10/10   LEFT CAROTID ENDARTERECTOMY  . Complication of anesthesia    BP WENT UP AT [facility] "  . COPD (chronic obstructive pulmonary disease) ([place])   . Diverticulitis   . Diverticulosis of colon (without mention of hemorrhage)   . DJD (degenerative joint disease)   . Fatty liver   . Full dentures   . GERD (gastroesophageal reflux disease)   . H/O hiatal hernia   . Hyperlipidemia   . Hypertension   . Neuromuscular disorder ([place])    peripheral neuropathy  . Non-pressure chronic ulcer of other part of left foot limited to breakdown of skin ([place]) 11/12/2016  . Osteomyelitis ([place])    left 5th metatarsal  . PAD (peripheral artery disease) ([place])    Distal aortogram June 2012. Atherectomy left popliteal artery July 2012.   . Pseudoclaudication 11/15/2018  . Slurred speech    AS PER WIFE IN D/C NOTE 11/10/10  . 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 Unstable angina ([place]) 09/16/2018  . Wears glasses    Family History  Problem Relation Age of Onset  . Heart disease Father        Before age 38-  CAD, BPG  . Diabetes Father        Amputation  . Cancer Father        PROSTATE  . Hyperlipidemia Father   . Hypertension Father   . Heart attack Father        Triple BPG  . Varicose Veins Father   . Colon cancer Brother   . Diabetes  Brother   . Heart disease Brother 37       A-Fib. Before age 65  . Hyperlipidemia Brother   . Hypertension Brother   . Cancer Sister        Breast  . Hyperlipidemia Sister   . Hypertension Sister   . Hypertension Son   . Arthritis Other        GRANDMOTHER  . Hypertension Other        OTHER FAMILY MEMBERS   Past Surgical History:  Procedure Laterality Date  . AMPUTATION  11/05/2011   Procedure: AMPUTATION RAY;  Surgeon: Wylene Simmer, MD;  Location: [place];  Service: Orthopedics;  Laterality: Right;  Amputation of Right 4&5th Toes  . AMPUTATION Left 11/26/2012   Procedure: AMPUTATION RAY;  Surgeon: Wylene Simmer, MD;  Location: [place];  Service: Orthopedics;  Laterality: Left;  fourth ray amputation  . AMPUTATION Right 08/27/2014   Procedure: Transmetatarsal Amputation;  Surgeon: Newt Minion, MD;  Location: [place];  Service: Orthopedics;  Laterality: Right;  . AMPUTATION Right 01/14/2015   Procedure: AMPUTATION BELOW KNEE;  Surgeon: Newt Minion, MD;  Location: [place];  Service: Orthopedics;  Laterality: Right;  .  AMPUTATION Left 10/21/2015   Procedure: Left Foot 5th Ray Amputation;  Surgeon: Newt Minion, MD;  Location: [place];  Service: Orthopedics;  Laterality: Left;  . ANTERIOR FUSION CERVICAL SPINE  02/06/06   C4-5, C5-6, C6-7; SURGEON DR. MAX COHEN  . BACK SURGERY    . BELOW KNEE LEG AMPUTATION Right   . CARDIAC CATHETERIZATION  10/31/04   2009  . CAROTID ENDARTERECTOMY  11/10/10  . CAROTID ENDARTERECTOMY Left 11/10/2010   Subtotal occlusion of left internal carotid artery with left hemispheric transient ischemic attacks.  . CAROTID STENT    . CARPAL TUNNEL RELEASE Right 10/21/2013   Procedure: RIGHT CARPAL TUNNEL RELEASE;  Surgeon: Wynonia Sours, MD;  Location: [place];  Service: Orthopedics;  Laterality: Right;  . CHOLECYSTECTOMY    . COLON SURGERY    . COLONOSCOPY    . COLOSTOMY REVERSAL  05/21/2018   ileostomy reversal  . CYSTOSCOPY WITH STENT PLACEMENT Bilateral  01/13/2018   Procedure: CYSTOSCOPY WITH BILATERAL URETERAL CATHETER PLACEMENT;  Surgeon: Ardis Hughs, MD;  Location: [facility];  Service: Urology;  Laterality: Bilateral;  . ESOPHAGEAL MANOMETRY Bilateral 07/19/2014   Procedure: ESOPHAGEAL MANOMETRY (EM);  Surgeon: Jerene Bears, MD;  Location: [facility];  Service: Gastroenterology;  Laterality: Bilateral;  . FEMORAL ARTERY STENT     x6  . FINGER SURGERY    . FOOT SURGERY  04/25/2016    EXCISION BASE 5TH METATARSAL AND PARTIAL CUBOID LEFT FOOT  . HERNIA REPAIR     LEFT INGUINAL AND UMBILICAL REPAIRS  . HERNIA REPAIR    . I & D EXTREMITY Left 04/25/2016   Procedure: EXCISION BASE 5TH METATARSAL AND PARTIAL CUBOID LEFT FOOT;  Surgeon: Newt Minion, MD;  Location: [place];  Service: Orthopedics;  Laterality: Left;  . ILEOSTOMY  01/13/2018   Procedure: ILEOSTOMY;  Surgeon: Clovis Riley, MD;  Location: [facility];  Service: General;;  . ILEOSTOMY CLOSURE N/A 05/21/2018   Procedure: ILEOSTOMY REVERSAL ERAS PATHWAY;  Surgeon: Clovis Riley, MD;  Location: [place];  Service: General;  Laterality: N/A;  . IR RADIOLOGIST EVAL & MGMT  11/19/2017  . IR RADIOLOGIST EVAL & MGMT  12/03/2017  . IR RADIOLOGIST EVAL & MGMT  12/18/2017  . JOINT REPLACEMENT Right 2001   Total  . LAMINECTOMY     X 3 LUMBAR AND X 2 CERVICAL SPINE OPERATIONS  . LAPAROSCOPIC CHOLECYSTECTOMY W/ CHOLANGIOGRAPHY  11/09/04   SURGEON DR. Luella Cook  . LEFT HEART CATH AND CORONARY ANGIOGRAPHY N/A 09/16/2018   Procedure: LEFT HEART CATH AND CORONARY ANGIOGRAPHY;  Surgeon: Nigel Mormon, MD;  Location: [place] [facility];  Service: Cardiovascular;  Laterality: N/A;  . LEFT HEART CATHETERIZATION WITH CORONARY ANGIOGRAM N/A 10/29/2014   Procedure: LEFT HEART CATHETERIZATION WITH CORONARY ANGIOGRAM;  Surgeon: Laverda Page, MD;  Location: [facility];  Service: Cardiovascular;  Laterality: N/A;  . LOWER EXTREMITY ANGIOGRAM N/A 03/19/2012   Procedure: LOWER EXTREMITY ANGIOGRAM;   Surgeon: Burnell Blanks, MD;  Location: [facility] C[facility];  Service: Cardiovascular;  Laterality: N/A;  . NECK SURGERY    . PARTIAL COLECTOMY N/A 01/13/2018   Procedure: LAPAROSCOPIC ASSISTED   SIGMOID COLECTOMY ILEOSTOMY;  Surgeon: Clovis Riley, MD;  Location: [facility];  Service: General;  Laterality: N/A;  . PENILE PROSTHESIS IMPLANT  08/14/05   INFRAPUBIC INSERTION OF INFLATABLE PENILE PROSTHESIS; SURGEON DR. Amalia Hailey  . PENILE PROSTHESIS IMPLANT    . PERCUTANEOUS CORONARY STENT INTERVENTION (  PCI-S) Right 10/29/2014   Procedure: PERCUTANEOUS CORONARY STENT INTERVENTION (PCI-S);  Surgeon: Laverda Page, MD;  Location[facility] C[facility] LAB;  Service: Cardiovascular;  Laterality: Right;  . SHOULDER ARTHROSCOPY    . SPINE SURGERY    . TOE AMPUTATION Left   . TONSILLECTOMY    . TOTAL KNEE ARTHROPLASTY  07/2002   RIGHT KNEE ; SURGEON  DR. [name] ALSO HAD ARTHROSCOPIC RIGHT KNEE IN  10/2001  . TOTAL KNEE ARTHROPLASTY    . ULNAR NERVE TRANSPOSITION Right 10/21/2013   Procedure: RIGHT ELBOW  ULNAR NERVE DECOMPRESSION;  Surgeon: Wynonia Sours, MD;  Location: [place];  Service: Orthopedics;  Laterality: Right;    Short Social History:  Social History   Tobacco Use  . Smoking status: Current Every Day Smoker    Packs/day: 2.00    Years: 35.00    Pack years: 70.00    Types: Cigarettes, E-cigarettes    Last attempt to quit: 10/28/2011    Years since quitting: 8.5  . Smokeless tobacco: Never Used  Substance Use Topics  . Alcohol use: Not Currently    Comment: "not in a long time"    Allergies  Allergen Reactions  . Contrast Media [Iodinated Diagnostic Agents]     Difficulty breathing    . Ivp Dye [Iodinated Diagnostic Agents] Anaphylaxis and Other (See Comments)    Breathing problems   . Adhesive [Tape] Rash    Rash after 2-3 days use    Current Outpatient Medications  Medication Sig Dispense Refill  . amLODipine (NORVASC) 10 MG tablet Take 1 tablet (10 mg total) by  mouth daily. 90 tablet 1  . aspirin EC 81 MG tablet Take 81 mg by mouth daily.    Marland Kitchen atorvastatin (LIPITOR) 40 MG tablet Take 40 mg by mouth daily.    . carvedilol (COREG) 25 MG tablet Take 1.5 tablets (37.5 mg total) by mouth 2 (two) times daily. 270 tablet 2  . clobetasol (TEMOVATE) 0.05 % external solution Apply 1 application topically daily as needed (dry scalp).    . clopidogrel (PLAVIX) 75 MG tablet Take 75 mg by mouth daily.     . clotrimazole-betamethasone (LOTRISONE) cream Apply 1 application topically as needed (on affected areas on skin).    . collagenase (SANTYL) ointment Apply topically daily. (Patient taking differently: Apply 1 application topically daily as needed (wound care). TO AFFECTED SITE(S)) 15 g 0  . doxycycline (VIBRA-TABS) 100 MG tablet Take 1 tablet (100 mg total) by mouth 2 (two) times daily. 28 tablet 0  . fentaNYL (DURAGESIC - DOSED MCG/HR) 50 MCG/HR Place 50 mcg onto the skin every 3 (three) days.    . ferrous sulfate 325 (65 FE) MG tablet Take 325 mg by mouth daily with breakfast.    . fluticasone (CUTIVATE) 0.05 % cream Apply 1 application topically 2 (two) times daily as needed (dry skin on face).     . folic acid (FOLVITE) 588 MCG tablet Take 800 mcg by mouth daily.     . Glucosamine HCl (GLUCOSAMINE PO) Take 2,000 mg by mouth daily.     Marland Kitchen HUMALOG KWIKPEN 100 UNIT/ML KiwkPen Inject 15 Units into the skin 3 (three) times daily with meals. 15 units with breakfast, lunch, & dinner-- pt may increase dose by 1 unit if sugar is elevated  0  . ketoconazole (NIZORAL) 2 % cream Apply 1 application topically 2 (two) times daily.  1  . linaclotide (LINZESS) 145 MCG CAPS capsule Take 145 mcg by  mouth daily before breakfast.    . Multiple Vitamin (MULTIVITAMIN) tablet Take 1 tablet by mouth daily.    Marland Kitchen NEEDLE, REUSABLE, 22 G 22G X 1-1/2" MISC 1 Units by Does not apply route as directed. For B12 IM inj 10 each 0  . omega-3 acid ethyl esters (LOVAZA) 1 g capsule Take 2 g by  mouth 2 (two) times daily.    . ondansetron (ZOFRAN) 4 MG tablet Take 4 mg by mouth every 6 (six) hours as needed for nausea or vomiting.    Marland Kitchen oxyCODONE-acetaminophen (PERCOCET/ROXICET) 5-325 MG tablet Take 1 tablet by mouth every 8 (eight) hours as needed. (Patient taking differently: Take 1 tablet by mouth 3 (three) times daily. ) 15 tablet 0  . pantoprazole (PROTONIX) 40 MG tablet Take 40 mg by mouth daily.    . Polyvinyl Alcohol-Povidone PF (REFRESH) 1.4-0.6 % SOLN Place 1 drop into both eyes daily.    . pregabalin (LYRICA) 100 MG capsule Take 1 capsule (100 mg total) by mouth 3 (three) times daily. 30 capsule 0  . rOPINIRole (REQUIP) 2 MG tablet Take 2 mg by mouth at bedtime.     . Syringe/Needle, Disp, (SYRINGE 3CC/22GX1-1/2") 22G X 1-1/2" 3 ML MISC 1 Syringe by Does not apply route as directed. For b12 IM inj 10 each 0  . tamsulosin (FLOMAX) 0.4 MG CAPS capsule Take 0.4 mg by mouth daily.    Marland Kitchen torsemide (DEMADEX) 20 MG tablet Take 20 mg by mouth daily. May take an additional tablet for increased swelling.     No current facility-administered medications for this visit.    Review of Systems  Constitutional:  Constitutional negative. HENT: HENT negative.  Eyes: Eyes negative.  Respiratory: Respiratory negative.  Cardiovascular: Cardiovascular negative.  GI: Gastrointestinal negative.  Musculoskeletal: Positive for gait problem.  Skin: Skin negative.  Hematologic: Hematologic/lymphatic negative.  Psychiatric: Psychiatric negative.        Objective:  Objective   Vitals:   05/25/20 1148  BP: (!) 148/64  Pulse: 71  Resp: 20  Temp: 99.2 F (37.3 C)  SpO2: 96%  Weight: 288 lb (130.6 kg)  Height: 6\' 2"  (1.88 m)   Body mass index is 36.98 kg/m.  Physical Exam HENT:     Head: Normocephalic.  Eyes:     Pupils: Pupils are equal, round, and reactive to light.  Cardiovascular:     Pulses: Normal pulses.          Radial pulses are 2+ on the right side and 2+ on the left  side.  Pulmonary:     Effort: Pulmonary effort is normal.  Abdominal:     General: Abdomen is flat.     Palpations: Abdomen is soft.  Musculoskeletal:     Cervical back: Normal range of motion.     Comments: Right below-knee amputation  Skin:    General: Skin is warm.     Capillary Refill: Capillary refill takes less than 2 seconds.  Neurological:     Mental Status: He is alert.  Psychiatric:        Mood and Affect: Mood normal.        Behavior: Behavior normal.        Thought Content: Thought content normal.        Judgment: Judgment normal.     Data: I have independent interpreted his upper extremity arterial duplex to be triphasic waveforms throughout brachial artery on the right 0.66 cm left 0.54 cm.  I have also independently interpreted his  upper extremity vein mapping which appears to have suitable basilic and cephalic veins from the wrist up.       Assessment/Plan:     64 year old male with chronic kidney disease now here for permanent dialysis discussion.  We discussed his options being catheter, fistula, graft with fistula being preferred.  He does appear to have suitable veins in his nondominant left upper extremity.  He is possibly a candidate for left radial cephalic fistula otherwise we will consider upper arm cephalic or basilic vein fistula.  We discussed the risk and benefits including with his wife on the telephone and they demonstrate good understanding.     Waynetta Sandy MD [facility]

## 2020-05-27 ENCOUNTER — Encounter: Payer: Self-pay | Admitting: Cardiology

## 2020-05-27 ENCOUNTER — Other Ambulatory Visit: Payer: Self-pay

## 2020-05-27 ENCOUNTER — Ambulatory Visit: Payer: Medicare Other | Admitting: Cardiology

## 2020-05-27 VITALS — Ht 74.0 in

## 2020-05-27 DIAGNOSIS — E782 Mixed hyperlipidemia: Secondary | ICD-10-CM

## 2020-05-27 DIAGNOSIS — I2510 Atherosclerotic heart disease of native coronary artery without angina pectoris: Secondary | ICD-10-CM

## 2020-05-27 DIAGNOSIS — I10 Essential (primary) hypertension: Secondary | ICD-10-CM

## 2020-05-27 DIAGNOSIS — I739 Peripheral vascular disease, unspecified: Secondary | ICD-10-CM

## 2020-05-27 DIAGNOSIS — I951 Orthostatic hypotension: Secondary | ICD-10-CM

## 2020-05-27 NOTE — Progress Notes (Signed)
Primary Physician/Referring:  [name], PA-C  Patient ID: [name], male    DOB: 06/05/1956, 64 y.o.   MRN: [id number]  Chief Complaint  Patient presents with  . Coronary Artery Disease  . PAD  . Hypertension  . Follow-up    6 month   HPI:    [name]  is a 64 y.o. male  with severe peripheral arterial disease and has history of right below-knee amputation and toe lamputations on the left, multiple peripheral interventions in the past, uncontrolled diabetes mellitus, ESRD, severe diabetic retinopathy, coronary artery disease angioplasty to the right coronary artery on 10/29/2014, right carotid endarterectomy in 2012 , OSA Unable to tolerate CPAP, chronic back pain and neck and back surgery in past and on chronic pain medications. Coronary Angiography 09/17/2018 revealed widely patent stent and no significant new disease.    Patient presents for 33-monthfollow-up of coronary artery disease and peripheral arterial disease.  Denies claudication, angina.  He does continue to use e-cigarettes regularly.  He is recently been evaluated by vascular surgery for creation of fistula for dialysis.  He is also being followed by Dr. EMadelon Lipsfor anemia, currently receiving iron infusions.  Past Medical History:  Diagnosis Date  . Carotid artery occlusion 11/10/10   LEFT CAROTID ENDARTERECTOMY  . Complication of anesthesia    BP WENT UP AT [facility] "  . COPD (chronic obstructive pulmonary disease) ([place]   . Diverticulitis   . Diverticulosis of colon (without mention of hemorrhage)   . DJD (degenerative joint disease)   . Fatty liver   . Full dentures   . GERD (gastroesophageal reflux disease)   . H/O hiatal hernia   . Hyperlipidemia   . Hypertension   . Neuromuscular disorder ([place]    peripheral neuropathy  . Non-pressure chronic ulcer of other part of left foot limited to breakdown of skin ([place] 11/12/2016  . Osteomyelitis ([place]    left 5th metatarsal  . PAD (peripheral artery  disease) ([facility]    Distal aortogram June 2012. Atherectomy left popliteal artery July 2012.   . Pseudoclaudication 11/15/2018  . Slurred speech    AS PER WIFE IN D/C NOTE 11/10/10  . 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 Unstable angina ([place] 09/16/2018  . Wears glasses    Past Surgical History:  Procedure Laterality Date  . AMPUTATION  11/05/2011   Procedure: AMPUTATION RAY;  Surgeon: JWylene Simmer MD;  [place]  Service: Orthopedics;  Laterality: Right;  Amputation of Right 4&5th Toes  . AMPUTATION Left 11/26/2012   Procedure: AMPUTATION RAY;  Surgeon: JWylene Simmer MD;  [place]  Service: Orthopedics;  Laterality: Left;  fourth ray amputation  . AMPUTATION Right 08/27/2014   Procedure: Transmetatarsal Amputation;  Surgeon: MNewt Minion MD;  [place]  Service: Orthopedics;  Laterality: Right;  . AMPUTATION Right 01/14/2015   Procedure: AMPUTATION BELOW KNEE;  Surgeon: MNewt Minion MD;  [place] [facility]  Service: Orthopedics;  Laterality: Right;  . AMPUTATION Left 10/21/2015   Procedure: Left Foot 5th Ray Amputation;  Surgeon: MNewt Minion MD;  [place]  Service: Orthopedics;  Laterality: Left;  . ANTERIOR FUSION CERVICAL SPINE  02/06/06   C4-5, C5-6, C6-7; SURGEON DR. MAX COHEN  . BACK SURGERY    . BELOW KNEE LEG AMPUTATION Right   . CARDIAC CATHETERIZATION  10/31/04   2009  . CAROTID ENDARTERECTOMY  11/10/10  . CAROTID ENDARTERECTOMY Left 11/10/2010   Subtotal occlusion  of left internal carotid artery with left hemispheric transient ischemic attacks.  . CAROTID STENT    . CARPAL TUNNEL RELEASE Right 10/21/2013   Procedure: RIGHT CARPAL TUNNEL RELEASE;  Surgeon: Wynonia Sours, MD;  Location: [place];  Service: Orthopedics;  Laterality: Right;  . CHOLECYSTECTOMY    . COLON SURGERY    . COLONOSCOPY    . COLOSTOMY REVERSAL  05/21/2018   ileostomy reversal  . CYSTOSCOPY WITH STENT PLACEMENT Bilateral 01/13/2018   Procedure: CYSTOSCOPY WITH BILATERAL  URETERAL CATHETER PLACEMENT;  Surgeon: Ardis Hughs, MD;  [facility];  Service: Urology;  Laterality: Bilateral;  . ESOPHAGEAL MANOMETRY Bilateral 07/19/2014   Procedure: ESOPHAGEAL MANOMETRY (EM);  Surgeon: Jerene Bears, MD;  [facility];  Service: Gastroenterology;  Laterality: Bilateral;  . FEMORAL ARTERY STENT     x6  . FINGER SURGERY    . FOOT SURGERY  04/25/2016    EXCISION BASE 5TH METATARSAL AND PARTIAL CUBOID LEFT FOOT  . HERNIA REPAIR     LEFT INGUINAL AND UMBILICAL REPAIRS  . HERNIA REPAIR    . I & D EXTREMITY Left 04/25/2016   Procedure: EXCISION BASE 5TH METATARSAL AND PARTIAL CUBOID LEFT FOOT;  Surgeon: Newt Minion, MD;  Location: [place];  Service: Orthopedics;  Laterality: Left;  . ILEOSTOMY  01/13/2018   Procedure: ILEOSTOMY;  Surgeon: Clovis Riley, MD;  Location[facility];  Service: General;;  . ILEOSTOMY CLOSURE N/A 05/21/2018   Procedure: ILEOSTOMY REVERSAL ERAS PATHWAY;  Surgeon: Clovis Riley, MD;  Location: [place];  Service: General;  Laterality: N/A;  . IR RADIOLOGIST EVAL & MGMT  11/19/2017  . IR RADIOLOGIST EVAL & MGMT  12/03/2017  . IR RADIOLOGIST EVAL & MGMT  12/18/2017  . JOINT REPLACEMENT Right 2001   Total  . LAMINECTOMY     X 3 LUMBAR AND X 2 CERVICAL SPINE OPERATIONS  . LAPAROSCOPIC CHOLECYSTECTOMY W/ CHOLANGIOGRAPHY  11/09/04   SURGEON DR. Luella Cook  . LEFT HEART CATH AND CORONARY ANGIOGRAPHY N/A 09/16/2018   Procedure: LEFT HEART CATH AND CORONARY ANGIOGRAPHY;  Surgeon: Nigel Mormon, MD;  Location: [place] [facility];  Service: Cardiovascular;  Laterality: N/A;  . LEFT HEART CATHETERIZATION WITH CORONARY ANGIOGRAM N/A 10/29/2014   Procedure: LEFT HEART CATHETERIZATION WITH CORONARY ANGIOGRAM;  Surgeon: Laverda Page, MD;  Location[facility] C[facility] LAB;  Service: Cardiovascular;  Laterality: N/A;  . LOWER EXTREMITY ANGIOGRAM N/A 03/19/2012   Procedure: LOWER EXTREMITY ANGIOGRAM;  Surgeon: Burnell Blanks, MD;  Location:  [facility] C[facility];  Service: Cardiovascular;  Laterality: N/A;  . NECK SURGERY    . PARTIAL COLECTOMY N/A 01/13/2018   Procedure: LAPAROSCOPIC ASSISTED   SIGMOID COLECTOMY ILEOSTOMY;  Surgeon: Clovis Riley, MD;  [facility];  Service: General;  Laterality: N/A;  . PENILE PROSTHESIS IMPLANT  08/14/05   INFRAPUBIC INSERTION OF INFLATABLE PENILE PROSTHESIS; SURGEON DR. Amalia Hailey  . PENILE PROSTHESIS IMPLANT    . PERCUTANEOUS CORONARY STENT INTERVENTION (PCI-S) Right 10/29/2014   Procedure: PERCUTANEOUS CORONARY STENT INTERVENTION (PCI-S);  Surgeon: Laverda Page, MD;  Location: [facility] CATH LAB;  Service: Cardiovascular;  Laterality: Right;  . SHOULDER ARTHROSCOPY    . SPINE SURGERY    . TOE AMPUTATION Left   . TONSILLECTOMY    . TOTAL KNEE ARTHROPLASTY  07/2002   RIGHT KNEE ; SURGEON  DR. [name] ALSO HAD ARTHROSCOPIC RIGHT KNEE IN  10/2001  . TOTAL KNEE ARTHROPLASTY    .  ULNAR NERVE TRANSPOSITION Right 10/21/2013   Procedure: RIGHT ELBOW  ULNAR NERVE DECOMPRESSION;  Surgeon: Wynonia Sours, MD;  Location: [place];  Service: Orthopedics;  Laterality: Right;   Social History   Tobacco Use  . Smoking status: Current Every Day Smoker    Packs/day: 2.00    Years: 35.00    Pack years: 70.00    Types: Cigarettes, E-cigarettes    Last attempt to quit: 10/28/2011    Years since quitting: 8.5  . Smokeless tobacco: Never Used  Substance Use Topics  . Alcohol use: Not Currently    Comment: "not in a long time"   Marital Status: Married   ROS  Review of Systems  Constitutional: Positive for malaise/fatigue.  Cardiovascular: Positive for leg swelling. Negative for chest pain, dyspnea on exertion, palpitations and syncope.  Hematologic/Lymphatic: Does not bruise/bleed easily.  Musculoskeletal: Positive for arthritis and back pain.  Gastrointestinal: Negative for melena.   Objective   Vitals with BMI 05/27/2020 05/25/2020 05/23/2020  Height 6' 2"  6' 2"  -  Weight - 288 lbs -  BMI -  82.50 -  Systolic - 037 048  Diastolic - 64 56  Pulse - 71 62    Height 6' 2"  (1.88 m). Body mass index is 36.98 kg/m.   Physical Exam Constitutional:      General: He is not in acute distress.    Comments: Well built and moderately obese in no acute distress  Cardiovascular:     Rate and Rhythm: Normal rate and regular rhythm.     Pulses: Intact distal pulses.          Carotid pulses are on the right side with bruit and on the left side with bruit.      Femoral pulses are 1+ on the right side with bruit and 1+ on the left side with bruit.      Popliteal pulses are 2+ on the right side and 2+ on the left side.       Dorsalis pedis pulses are 1+ on the left side. Right dorsalis pedis pulse not accessible.       Posterior tibial pulses are 2+ on the left side. Right posterior tibial pulse not accessible.     Heart sounds: Normal heart sounds. No murmur heard.  No gallop.      Comments: Right BKA. There is 2-3+ left leg pitting edema below knee. No JVD The leg support brace not taken out (patient states no open wounds) Pulmonary:     Effort: Pulmonary effort is normal.     Breath sounds: Normal breath sounds.  Abdominal:     General: Bowel sounds are normal.     Palpations: Abdomen is soft.  Musculoskeletal:     Cervical back: Neck supple.    Radiology: No results found.  Laboratory examination:   Recent Labs    06/19/19 0047  NA 134*  K 3.2*  CL 102  CO2 20*  GLUCOSE 313*  BUN 17  CREATININE 2.91*  CALCIUM 7.8*  GFRNONAA 22*  GFRAA 25*   CMP Latest Ref Rng & Units 06/19/2019 09/18/2018 09/16/2018  Glucose 70 - 99 mg/dL 313(H) 155(H) -  BUN 8 - 23 mg/dL 17 35(H) -  Creatinine 0.61 - 1.24 mg/dL 2.91(H) 3.34(H) 2.11(H)  Sodium 135 - 145 mmol/L 134(L) 132(L) -  Potassium 3.5 - 5.1 mmol/L 3.2(L) 3.4(L) -  Chloride 98 - 111 mmol/L 102 99 -  CO2 22 - 32 mmol/L 20(L) 24 -  Calcium 8.9 - 10.3 mg/dL 7.8(L) 8.0(L) -  Total Protein 6.5 - 8.1 g/dL 5.6(L) - -  Total  Bilirubin 0.3 - 1.2 mg/dL 0.7 - -  Alkaline Phos 38 - 126 U/L 107 - -  AST 15 - 41 U/L 17 - -  ALT 0 - 44 U/L 10 - -   CBC Latest Ref Rng & Units 05/23/2020 05/09/2020 04/25/2020  WBC 4.0 - 10.5 K/uL - - -  Hemoglobin 13.0 - 17.0 g/dL 10.8(L) 11.3(L) 10.8(L)  Hematocrit 39 - 52 % - - -  Platelets 150 - 400 K/uL - - -   Lipid Panel  No results found for: CHOL, TRIG, HDL, CHOLHDL, VLDL, LDLCALC, LDLDIRECT HEMOGLOBIN A1C Lab Results  Component Value Date   HGBA1C 5.7 (H) 09/16/2018   MPG 116.89 09/16/2018   External labs:  A1C 6.100 % 03/30/2020   Hemoglobin 10.800 05/23/2020 Platelets 203.000 x1 05/02/2020  Creatinine, Serum 4.140 mg/ 05/02/2020 Potassium 4.300 mm 05/02/2020 ALT (SGPT) 16.000 IU/ 05/02/2020  Cholesterol, total 142.000 m 08/19/2018 HDL 32.000 mg 08/19/2018 LDL 79.000 mg 08/19/2018 Triglycerides 156.000 m 08/19/2018  A1C 6.900 % 09/02/2019  Creatinine, Serum 3.590 mg/ 10/05/2019 Potassium 4.300 mm 10/05/2019 ALT (SGPT) 12.000 IU/ 10/05/2019  Hemoglobin 9.700 11/09/2019; INR 1.000 01/07/2018 Platelets 232.000 x1 10/05/2019  Medications   Current Outpatient Medications on File Prior to Visit  Medication Sig Dispense Refill  . amLODipine (NORVASC) 10 MG tablet Take 1 tablet (10 mg total) by mouth daily. 90 tablet 1  . aspirin EC 81 MG tablet Take 81 mg by mouth daily.    Marland Kitchen atorvastatin (LIPITOR) 40 MG tablet Take 40 mg by mouth daily.    . carvedilol (COREG) 25 MG tablet Take 1.5 tablets (37.5 mg total) by mouth 2 (two) times daily. 270 tablet 2  . clobetasol (TEMOVATE) 0.05 % external solution Apply 1 application topically daily as needed (dry scalp).    . clopidogrel (PLAVIX) 75 MG tablet Take 75 mg by mouth daily.     . clotrimazole-betamethasone (LOTRISONE) cream Apply 1 application topically as needed (on affected areas on skin).    . collagenase (SANTYL) ointment Apply topically daily. (Patient taking differently: Apply 1 application topically daily as needed  (wound care). TO AFFECTED SITE(S)) 15 g 0  . fentaNYL (DURAGESIC - DOSED MCG/HR) 50 MCG/HR Place 50 mcg onto the skin every 3 (three) days.    . ferrous sulfate 325 (65 FE) MG tablet Take 325 mg by mouth daily with breakfast.    . fluticasone (CUTIVATE) 0.05 % cream Apply 1 application topically 2 (two) times daily as needed (dry skin on face).     . folic acid (FOLVITE) 332 MCG tablet Take 800 mcg by mouth daily.     . Glucosamine HCl (GLUCOSAMINE PO) Take 2,000 mg by mouth daily.     Marland Kitchen HUMALOG KWIKPEN 100 UNIT/ML KiwkPen Inject 15 Units into the skin 3 (three) times daily with meals. 15 units with breakfast, lunch, & dinner-- pt may increase dose by 1 unit if sugar is elevated  0  . ketoconazole (NIZORAL) 2 % cream Apply 1 application topically 2 (two) times daily.  1  . linaclotide (LINZESS) 145 MCG CAPS capsule Take 145 mcg by mouth daily before breakfast.    . omega-3 acid ethyl esters (LOVAZA) 1 g capsule Take 2 g by mouth 2 (two) times daily.    . ondansetron (ZOFRAN) 4 MG tablet Take 4 mg by mouth every 6 (six) hours as needed for nausea  or vomiting.    Marland Kitchen oxyCODONE-acetaminophen (PERCOCET/ROXICET) 5-325 MG tablet Take 1 tablet by mouth every 8 (eight) hours as needed. (Patient taking differently: Take 1 tablet by mouth 3 (three) times daily. ) 15 tablet 0  . pantoprazole (PROTONIX) 40 MG tablet Take 40 mg by mouth daily.    . Polyvinyl Alcohol-Povidone PF (REFRESH) 1.4-0.6 % SOLN Place 1 drop into both eyes daily.    . pregabalin (LYRICA) 100 MG capsule Take 1 capsule (100 mg total) by mouth 3 (three) times daily. 30 capsule 0  . rOPINIRole (REQUIP) 2 MG tablet Take 2 mg by mouth at bedtime.     . Syringe/Needle, Disp, (SYRINGE 3CC/22GX1-1/2") 22G X 1-1/2" 3 ML MISC 1 Syringe by Does not apply route as directed. For b12 IM inj 10 each 0  . tamsulosin (FLOMAX) 0.4 MG CAPS capsule Take 0.4 mg by mouth daily.    Marland Kitchen torsemide (DEMADEX) 20 MG tablet Take 20 mg by mouth daily. May take an  additional tablet for increased swelling.    Marland Kitchen BIDIL 20-37.5 MG tablet Take 1 tablet by mouth 3 (three) times daily.    . Multiple Vitamin (MULTIVITAMIN) tablet Take 1 tablet by mouth daily.    Marland Kitchen NEEDLE, REUSABLE, 22 G 22G X 1-1/2" MISC 1 Units by Does not apply route as directed. For B12 IM inj 10 each 0   No current facility-administered medications on file prior to visit.    Cardiac Studies:   Left popliteal artery atherectomy in June 2012 and left below knee PTA 2014 by Dr. Brunetta Jeans at [facility].   Lexiscan sestamibi stress test 03/18/2017: 1. The resting electrocardiogram demonstrated normal sinus rhythm, RBBB and no resting arrhythmias. Stress EKG is non-diagnostic for ischemia as it a pharmacologic stress using Lexiscan. Occasional PVC noted. Stress symptoms included dyspnea, dizziness and chest pain. 2. The LV is dilated both at rest and stress images. The LV end diastolic volume was 409WJ. Perfusion images reveal inferior soft tissue attenuation artifact. There is no ischemia or scar. Overall left ventricular systolic function was normal without regional wall motion abnormalities. The left ventricular ejection fraction was calculated to be 51%. This is a low risk study. Compared to the study done on 10/15/2014, inferior wall ischemia and associated inferior hypokinesis is no longer present.  Coronary angiogram 09/17/18: Widely patent stent stents. Normal LVEF. Coronary Angiogram 10/29/2014: CAD s/p stenting of the distal, mid and proximal RCA with implantation of 3 overlapping drug-eluting stent, from distal to proximal 2.5 x 38 mm, 2.75 x 38 mm and a 2.75 x 20 mm Promus premier DES.  Echocardiogram 09/16/2018:  Left ventricle: The cavity size was normal. There was moderate concentric hypertrophy. Systolic function was normal. The estimated ejection fraction was in the range of 55% to 60%. Features are consistent with a pseudonormal left ventricular filling pattern, with concomitant abnormal  relaxation and increased filling pressure (grade 2 diastolic dysfunction). Doppler parameters are consistent with both elevated ventricular end-diastolic filling pressure and elevated left atrial filling pressure. Mitral valve: Calcified annulus. Left atrium: The atrium was mildly dilated. Inferior vena cava: The vessel was dilated. The respirophasic diameter changes was normal, may suggest elevated central venous pressure.  Carotid artery duplex 06/09/2019:  Minimal stenosis in bilateral ICA of 1-15%. Heterogeneous plaque. Stenosis  in the right external carotid artery (<50%).  Left carotid endarterectomy site is patent.  Right vertebral artery flow is not well visualized. Antegrade left  vertebral artery flow.  No significant change from 03/01/2017. Follow  up studies when clinically  Indicated.  EKG:  EKG 05/27/2020: Sinus bradycardia at a rate of 57 bpm with first degree AV block, left atrial enlargement. Left axis deviation. Left anterior fascicular block. Complete right bundle branch block. Left ventricular hypertrophy. Non-specific T wave abnormality. No evidence of ischemia.  Compared to EKG 11/25/2019, no significant change.  EKG 11/25/2019: Sinus rhythm with first-degree block at the rate of 65 bpm, left atrial enlargement, left axis deviation, left anterior fascicular block.  Right bundle branch block.  LVH.  No evidence of ischemia.  Nonspecific T abnormality.  No significant change from EKG 05/25/2019, EKG 10/03/2018.  Assessment     ICD-10-CM   1. Coronary artery disease involving native coronary artery of native heart without angina pectoris  I25.10 EKG 12-Lead    CBC    CMP14+EGFR  2. Peripheral artery disease (HCC)  I73.9   3. Essential hypertension  I10   4. Orthostatic hypotension  I95.1   5. Mixed hyperlipidemia  E78.2 Lipid Panel With LDL/HDL Ratio   No orders of the defined types were placed in this encounter.  Medications Discontinued During This Encounter  Medication  Reason  . doxycycline (VIBRA-TABS) 100 MG tablet Completed Course    Recommendations:   GREGORI ABRIL  is a 64 y.o. male  with severe peripheral arterial disease and has history of right below-knee amputation and toe lamputations on the left, multiple peripheral interventions in the past, uncontrolled diabetes mellitus, stage IV CKD, coronary artery disease angioplasty to the right coronary artery on 10/29/2014, right carotid endarterectomy in 2012 , OSA Unable to tolerate CPAP, chronic back pain and neck and back surgery in past and on chronic pain medications.    Patient presents for 17-monthfollow-up of coronary artery disease and peripheral arterial disease.  He remains angina free, no symptoms of claudication or open wound.  Although exam is limited due to surgical tapes and bracing of the left lower leg and I am unable to see skin, patient reports there are no wounds and that he and his wife regularly check.  He does have 2-3+ pitting edema of his left lower leg that is chronic. It is unfortunate that his overall health status has deteriorated markedly over the past one year. DM and tobacco use and obesity continues to be major issue and now he is essentially wheel chair bound.   Patient's blood pressure is elevated today however upon questioning patient seems to be experiencing symptoms of orthostasis when he stands up and hence did not make changes to medications. He regularly monitors blood pressure at home. In the past he has been sensitive to changes in blood pressure.   Continue amlodipine 10 mg daily, carvedilol 37.5 mg twice daily, torsemide 20 mg daily, BiDil 20/37.5 mg 3 times daily.  External labs were reviewed, diabetes is well controlled.  Of note patient continues to use e-cigarettes, discussed the importance of quitting.  Patient expressed understanding.  Since last visit patient has developed end-stage renal disease, and has been evaluated by vascular surgery for creation of fistula  for dialysis. Patient continues to be followed for anemia likely related to end-stage renal disease and is receiving iron infusions.  He is followed by Dr. EMadelon Lipsfor this.   Will continue aspirin and Plavix in light of PAD and severe small vessel disease and as he has had resolution of his claudication symptoms.  He also continues to follow with ophthalmology in regard to severe diabetic retinopathy.  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  Therefore no current indication for pacemaker implantation.  Will obtain lipid profile testing, CBC, CMP next week.  Follow-up in 6 months for coronary artery disease and peripheral arterial disease.   Adrian Prows, MD, [facility] 05/28/2020, 10:05 AM Office: [phone]  CC: Madelon Lips, MD (Nephro)

## 2020-05-31 ENCOUNTER — Other Ambulatory Visit (HOSPITAL_COMMUNITY)
Admission: RE | Admit: 2020-05-31 | Discharge: 2020-05-31 | Disposition: A | Discharge status: Home / Self Care | Payer: 59 | Source: Ambulatory Visit | Attending: Vascular Surgery | Admitting: Vascular Surgery

## 2020-05-31 DIAGNOSIS — Z01812 Encounter for preprocedural laboratory examination: Secondary | ICD-10-CM | POA: Diagnosis present

## 2020-05-31 DIAGNOSIS — Z20822 Contact with and (suspected) exposure to covid-19: Secondary | ICD-10-CM | POA: Diagnosis not present

## 2020-05-31 LAB — CBC
Hematocrit: 35 % — ABNORMAL LOW (ref 37.5–51.0)
Hemoglobin: 11.5 g/dL — ABNORMAL LOW (ref 13.0–17.7)
MCH: 28.4 pg (ref 26.6–33.0)
MCHC: 32.9 g/dL (ref 31.5–35.7)
MCV: 86 fL (ref 79–97)
Platelets: 236 10*3/uL (ref 150–450)
RBC: 4.05 x10E6/uL — ABNORMAL LOW (ref 4.14–5.80)
RDW: 13.9 % (ref 11.6–15.4)
WBC: 7.5 10*3/uL (ref 3.4–10.8)

## 2020-05-31 LAB — LIPID PANEL WITH LDL/HDL RATIO
Cholesterol, Total: 117 mg/dL (ref 100–199)
HDL: 24 mg/dL — ABNORMAL LOW (ref >39)
LDL Chol Calc (NIH): 69 mg/dL (ref 0–99)
LDL/HDL Ratio: 2.9 ratio (ref 0.0–3.6)
Triglycerides: 133 mg/dL (ref 0–149)
VLDL Cholesterol Cal: 24 mg/dL (ref 5–40)

## 2020-05-31 LAB — CMP14+EGFR
ALT: 16 IU/L (ref 0–44)
AST: 14 IU/L (ref 0–40)
Albumin/Globulin Ratio: 1.4 (ref 1.2–2.2)
Albumin: 3.7 g/dL — ABNORMAL LOW (ref 3.8–4.8)
Alkaline Phosphatase: 138 IU/L — ABNORMAL HIGH (ref 44–121)
BUN/Creatinine Ratio: 6 — ABNORMAL LOW (ref 10–24)
BUN: 27 mg/dL (ref 8–27)
Bilirubin Total: 0.3 mg/dL (ref 0.0–1.2)
CO2: 19 mmol/L — ABNORMAL LOW (ref 20–29)
Calcium: 7.9 mg/dL — ABNORMAL LOW (ref 8.6–10.2)
Chloride: 106 mmol/L (ref 96–106)
Creatinine, Ser: 4.28 mg/dL — ABNORMAL HIGH (ref 0.76–1.27)
GFR calc Af Amer: 16 mL/min/{1.73_m2} — ABNORMAL LOW (ref >59)
GFR calc non Af Amer: 14 mL/min/{1.73_m2} — ABNORMAL LOW (ref >59)
Globulin, Total: 2.6 g/dL (ref 1.5–4.5)
Glucose: 226 mg/dL — ABNORMAL HIGH (ref 65–99)
Potassium: 4 mmol/L (ref 3.5–5.2)
Sodium: 139 mmol/L (ref 134–144)
Total Protein: 6.3 g/dL (ref 6.0–8.5)

## 2020-05-31 LAB — SARS CORONAVIRUS 2 (TAT 6-24 HRS): SARS Coronavirus 2: NEGATIVE

## 2020-05-31 NOTE — Progress Notes (Signed)
Attempted to call patient. Unable to reach due to number being disconnected. Faxed labs.

## 2020-05-31 NOTE — Progress Notes (Signed)
2nd attempt, no answer left a vm will try again later sch/rma

## 2020-05-31 NOTE — Progress Notes (Signed)
Pt called backed and was informed about message above

## 2020-06-01 ENCOUNTER — Encounter (HOSPITAL_COMMUNITY): Payer: Self-pay | Admitting: Vascular Surgery

## 2020-06-01 MED ORDER — DEXTROSE 5 % IV SOLN
3.0000 g | INTRAVENOUS | Status: AC
  Administered 2020-06-02: 3 g via INTRAVENOUS
  Filled 2020-06-01: qty 3

## 2020-06-01 NOTE — Progress Notes (Signed)
Anesthesia Chart Review: Same day workup  Follows with cardiology for hx of severe peripheral arterial disease and has history of right below-knee amputation and toe lamputations on the left, multiple peripheral interventions in the past, coronary artery disease angioplasty to the right coronary artery on 10/29/2014, right carotid endarterectomy in 2012. Coronary Angiography 09/17/2018 revealed widely patent stent and no significant new disease. Last seen by [name] 05/27/2020.  Per note, he remains angina free.  He is noted to be essentially wheelchair-bound.  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  No changes made to management that time, advised to follow-up in 6 months.  Poorly controlled IDDM 2.  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  She also follows him for anemia, and he receives iron infusions.  CMP and CBC from 05/30/2020 reviewed, creatinine 4.28 consistent with CKD 5, glucose 226 consistent with uncontrolled DM2, mild anemia with hemoglobin 11.5. Otherwise unremarkable.  EKG 05/27/2020: Sinus bradycardia at a rate of 57 bpm with first degree AV block, left atrial enlargement. Left axis deviation. Left anterior fascicular block. Complete right bundle branch block. Left ventricular hypertrophy. Non-specific T wave abnormality. No evidence of ischemia.  Compared to EKG 11/25/2019, no significant change.  Lexiscan sestamibi stress test 03/18/2017: 1. The resting electrocardiogram demonstrated normal sinus rhythm, RBBB and no resting arrhythmias. Stress EKG is non-diagnostic for ischemia as it a pharmacologic stress using Lexiscan. Occasional PVC noted. Stress symptoms included dyspnea, dizziness and chest pain. 2. The LV is dilated both at rest and stress images. The LV end diastolic volume was 270JJ. Perfusion images reveal inferior soft tissue attenuation artifact. There is no ischemia or scar. Overall left  ventricular systolic function was normal without regional wall motion abnormalities. The left ventricular ejection fraction was calculated to be 51%. This is a low risk study. Compared to the study done on 10/15/2014, inferior wall ischemia and associated inferior hypokinesis is no longer present.  Coronary angiogram 09/17/18: Widely patent stent stents. Normal LVEF. Coronary Angiogram 10/29/2014: CAD s/p stenting of the distal, mid and proximal RCA with implantation of 3 overlapping drug-eluting stent, from distal to proximal 2.5 x 38 mm, 2.75 x 38 mm and a 2.75 x 20 mm Promus premier DES.  Echocardiogram 09/16/2018:  Left ventricle: The cavity size was normal. There was moderate concentric hypertrophy. Systolic function was normal. Theestimated ejection fraction was in the range of 55% to 60%.Features are consistent with a pseudonormal left ventricularfilling pattern, with concomitant abnormal relaxation andincreased filling pressure (grade 2 diastolic dysfunction).Doppler parameters are consistent with both elevated ventricularend-diastolic filling pressure and elevated left atrial fillingpressure. Mitral valve: Calcified annulus. Left atrium: The atrium was mildly dilated. Inferior vena cava: The vessel was dilated. The respirophasicdiameter changes was normal, may suggest elevated central venouspressure.  Carotid artery duplex 06/09/2019:  Minimal stenosis in bilateral ICA of 1-15%. Heterogeneous plaque. Stenosis  in the right external carotid artery (<50%).  Left carotid endarterectomy site is patent.  Right vertebral artery flow is not well visualized. Antegrade left  vertebral artery flow.  No significant change from 03/01/2017. Follow up studies when clinically  Indicated.   Wynonia Musty [facility]/Anesthesiology Phone [phone] 06/01/2020 12:14 PM

## 2020-06-01 NOTE — Progress Notes (Signed)
PCP - [name], PA-C Cardiologist - Dr Adrian Prows  Chest x-ray - n/a EKG - 05/27/20 Stress Test - 03/18/17 ECHO - 09/17/18 Cardiac Cath - 09/16/18  Sleep Study - patient denies this dx CPAP - does not use cpap  Fasting Blood Sugar - 100-140s Checks Blood Sugar 3-4 times a day  . THE NIGHT BEFORE SURGERY, take 27 units of Lantus insulin.     . The day of surgery, If your CBG is greater than 220 mg/dL, you may take  of your sliding scale (correction) dose of humalog insulin.  . If your blood sugar is less than 70 mg/dL, you will need to treat for low blood sugar: o Treat a low blood sugar (less than 70 mg/dL) with  cup of clear juice (cranberry or apple), 4 glucose tablets, OR glucose gel. o Recheck blood sugar in 15 minutes after treatment (to make sure it is greater than 70 mg/dL). If your blood sugar is not greater than 70 mg/dL on recheck, call [phone] for further instructions.  Blood Thinner Instructions:  Follow your surgeon's instructions on when to stop plavix prior  to surgery.  If no instructions were given, then you will need to call your surgeon for those instruction.  Anesthesia review: Yes  STOP now taking any Aspirin (unless otherwise instructed by your surgeon), Aleve, Naproxen, Ibuprofen, Motrin, Advil, Goody's, BC's, all herbal medications, fish oil, and all vitamins.   Coronavirus Screening Covid test on 05/31/20 was negative  Patient verbalized understanding of instructions that were given via phone.

## 2020-06-01 NOTE — Anesthesia Preprocedure Evaluation (Addendum)
Anesthesia Evaluation  Patient identified by MRN, date of birth, ID band Patient awake    Reviewed: Allergy & Precautions, H&P , NPO status , Patient's Chart, lab work & pertinent test results  Airway Mallampati: III  TM Distance: >3 FB Neck ROM: Full    Dental no notable dental hx. (+) Teeth Intact, Dental Advisory Given   Pulmonary sleep apnea , COPD, Current SmokerPatient did not abstain from smoking.,    Pulmonary exam normal breath sounds clear to auscultation       Cardiovascular Exercise Tolerance: Good hypertension, Pt. on medications + angina + CAD and + Peripheral Vascular Disease  + dysrhythmias  Rhythm:Regular Rate:Normal     Neuro/Psych negative neurological ROS  negative psych ROS   GI/Hepatic Neg liver ROS, hiatal hernia, GERD  Medicated,  Endo/Other  diabetes, Insulin Dependent  Renal/GU ESRFRenal disease  negative genitourinary   Musculoskeletal  (+) Arthritis ,   Abdominal   Peds  Hematology  (+) Blood dyscrasia, anemia ,   Anesthesia Other Findings   Reproductive/Obstetrics negative OB ROS                           Anesthesia Physical Anesthesia Plan  ASA: III  Anesthesia Plan: MAC and Regional   Post-op Pain Management:    Induction: Intravenous  PONV Risk Score and Plan: 1 and Propofol infusion  Airway Management Planned: Simple Face Mask  Additional Equipment:   Intra-op Plan:   Post-operative Plan:   Informed Consent: I have reviewed the patients History and Physical, chart, labs and discussed the procedure including the risks, benefits and alternatives for the proposed anesthesia with the patient or authorized representative who has indicated his/her understanding and acceptance.     Dental advisory given  Plan Discussed with: CRNA  Anesthesia Plan Comments: (PAT note by Karoline Caldwell, PA-C:  Follows with cardiology for hx of severe peripheral  arterial disease and has history of right below-knee amputation and toe lamputations on the left, multiple peripheral interventions in the past, coronary artery disease angioplasty to the right coronary artery on 10/29/2014, right carotid endarterectomy in 2012. Coronary Angiography 09/17/2018 revealed widely patent stent and no significant new disease. Last seen by [name] 05/27/2020.  Per note, he remains angina free.  He is noted to be essentially wheelchair-bound.  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  No changes made to management that time, advised to follow-up in 6 months.  Poorly controlled IDDM 2.  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  She also follows him for anemia, and he receives iron infusions.  CMP and CBC from 05/30/2020 reviewed, creatinine 4.28 consistent with CKD 5, glucose 226 consistent with uncontrolled DM2, mild anemia with hemoglobin 11.5. Otherwise unremarkable.  EKG 05/27/2020: Sinus bradycardia at a rate of 57 bpm with first degree AV block, left atrial enlargement. Left axis deviation. Left anterior fascicular block. Complete right bundle branch block. Left ventricular hypertrophy. Non-specific T wave abnormality. No evidence of ischemia.  Compared to EKG 11/25/2019, no significant change.  Lexiscan sestamibi stress test 03/18/2017: 1. The resting electrocardiogram demonstrated normal sinus rhythm, RBBB and no resting arrhythmias. Stress EKG is non-diagnostic for ischemia as it a pharmacologic stress using Lexiscan. Occasional PVC noted. Stress symptoms included dyspnea, dizziness and chest pain. 2. The LV is dilated both at rest and stress images. The LV end diastolic volume was 329JJ. Perfusion images  reveal inferior soft tissue attenuation artifact. There is no ischemia or scar. Overall left ventricular systolic function was normal without regional wall motion abnormalities. The left ventricular  ejection fraction was calculated to be 51%. This is a low risk study. Compared to the study done on 10/15/2014, inferior wall ischemia and associated inferior hypokinesis is no longer present.  Coronary angiogram 09/17/18: Widely patent stent stents. Normal LVEF. Coronary Angiogram 10/29/2014: CAD s/p stenting of the distal, mid and proximal RCA with implantation of 3 overlapping drug-eluting stent, from distal to proximal 2.5 x 38 mm, 2.75 x 38 mm and a 2.75 x 20 mm Promus premier DES.  Echocardiogram 09/16/2018:  Left ventricle: The cavity size was normal. There was moderate concentric hypertrophy. Systolic function was normal. Theestimated ejection fraction was in the range of 55% to 60%.Features are consistent with a pseudonormal left ventricularfilling pattern, with concomitant abnormal relaxation andincreased filling pressure (grade 2 diastolic dysfunction).Doppler parameters are consistent with both elevated ventricularend-diastolic filling pressure and elevated left atrial fillingpressure. Mitral valve: Calcified annulus. Left atrium: The atrium was mildly dilated. Inferior vena cava: The vessel was dilated. The respirophasicdiameter changes was normal, may suggest elevated central venouspressure.  Carotid artery duplex 06/09/2019:  Minimal stenosis in bilateral ICA of 1-15%. Heterogeneous plaque. Stenosis  in the right external carotid artery (<50%).  Left carotid endarterectomy site is patent.  Right vertebral artery flow is not well visualized. Antegrade left  vertebral artery flow.  No significant change from 03/01/2017. Follow up studies when clinically  Indicated.  )      Anesthesia Quick Evaluation

## 2020-06-02 ENCOUNTER — Ambulatory Visit (HOSPITAL_COMMUNITY): Payer: 59 | Admitting: Certified Registered"

## 2020-06-02 ENCOUNTER — Encounter (HOSPITAL_COMMUNITY)
Admission: RE | Disposition: A | Discharge status: Home / Self Care | Payer: Self-pay | Source: Home / Self Care | Attending: Vascular Surgery

## 2020-06-02 ENCOUNTER — Encounter (HOSPITAL_COMMUNITY): Payer: Self-pay | Admitting: Vascular Surgery

## 2020-06-02 ENCOUNTER — Ambulatory Visit (HOSPITAL_COMMUNITY)
Admission: RE | Admit: 2020-06-02 | Discharge: 2020-06-02 | Disposition: A | Discharge status: Home / Self Care | Payer: 59 | Attending: Vascular Surgery | Admitting: Vascular Surgery

## 2020-06-02 ENCOUNTER — Other Ambulatory Visit: Payer: Self-pay

## 2020-06-02 DIAGNOSIS — F17210 Nicotine dependence, cigarettes, uncomplicated: Secondary | ICD-10-CM | POA: Insufficient documentation

## 2020-06-02 DIAGNOSIS — G629 Polyneuropathy, unspecified: Secondary | ICD-10-CM | POA: Diagnosis not present

## 2020-06-02 DIAGNOSIS — Z794 Long term (current) use of insulin: Secondary | ICD-10-CM | POA: Diagnosis not present

## 2020-06-02 DIAGNOSIS — Z89511 Acquired absence of right leg below knee: Secondary | ICD-10-CM | POA: Diagnosis not present

## 2020-06-02 DIAGNOSIS — N186 End stage renal disease: Secondary | ICD-10-CM | POA: Insufficient documentation

## 2020-06-02 DIAGNOSIS — Z79899 Other long term (current) drug therapy: Secondary | ICD-10-CM | POA: Insufficient documentation

## 2020-06-02 DIAGNOSIS — Z7902 Long term (current) use of antithrombotics/antiplatelets: Secondary | ICD-10-CM | POA: Insufficient documentation

## 2020-06-02 DIAGNOSIS — I739 Peripheral vascular disease, unspecified: Secondary | ICD-10-CM | POA: Diagnosis not present

## 2020-06-02 DIAGNOSIS — K219 Gastro-esophageal reflux disease without esophagitis: Secondary | ICD-10-CM | POA: Diagnosis not present

## 2020-06-02 DIAGNOSIS — I6529 Occlusion and stenosis of unspecified carotid artery: Secondary | ICD-10-CM | POA: Insufficient documentation

## 2020-06-02 DIAGNOSIS — I120 Hypertensive chronic kidney disease with stage 5 chronic kidney disease or end stage renal disease: Secondary | ICD-10-CM | POA: Insufficient documentation

## 2020-06-02 DIAGNOSIS — N185 Chronic kidney disease, stage 5: Secondary | ICD-10-CM | POA: Diagnosis not present

## 2020-06-02 DIAGNOSIS — J449 Chronic obstructive pulmonary disease, unspecified: Secondary | ICD-10-CM | POA: Insufficient documentation

## 2020-06-02 DIAGNOSIS — Z7982 Long term (current) use of aspirin: Secondary | ICD-10-CM | POA: Insufficient documentation

## 2020-06-02 DIAGNOSIS — E785 Hyperlipidemia, unspecified: Secondary | ICD-10-CM | POA: Diagnosis not present

## 2020-06-02 HISTORY — DX: Personal history of other medical treatment: Z92.89

## 2020-06-02 HISTORY — PX: AV FISTULA PLACEMENT: SHX1204

## 2020-06-02 LAB — GLUCOSE, CAPILLARY
Glucose-Capillary: 126 mg/dL — ABNORMAL HIGH (ref 70–99)
Glucose-Capillary: 134 mg/dL — ABNORMAL HIGH (ref 70–99)

## 2020-06-02 LAB — POCT I-STAT, CHEM 8
BUN: 32 mg/dL — ABNORMAL HIGH (ref 8–23)
Calcium, Ion: 1.1 mmol/L — ABNORMAL LOW (ref 1.15–1.40)
Chloride: 106 mmol/L (ref 98–111)
Creatinine, Ser: 5.1 mg/dL — ABNORMAL HIGH (ref 0.61–1.24)
Glucose, Bld: 130 mg/dL — ABNORMAL HIGH (ref 70–99)
HCT: 33 % — ABNORMAL LOW (ref 39.0–52.0)
Hemoglobin: 11.2 g/dL — ABNORMAL LOW (ref 13.0–17.0)
Potassium: 3.5 mmol/L (ref 3.5–5.1)
Sodium: 139 mmol/L (ref 135–145)
TCO2: 20 mmol/L — ABNORMAL LOW (ref 22–32)

## 2020-06-02 LAB — SURGICAL PCR SCREEN
MRSA, PCR: POSITIVE — AB
Staphylococcus aureus: POSITIVE — AB

## 2020-06-02 SURGERY — ARTERIOVENOUS (AV) FISTULA CREATION
Anesthesia: Monitor Anesthesia Care | Site: Arm Upper | Laterality: Left

## 2020-06-02 MED ORDER — CHLORHEXIDINE GLUCONATE 4 % EX LIQD: 60.0000 mL | Freq: Once | CUTANEOUS | Status: DC

## 2020-06-02 MED ORDER — FENTANYL CITRATE (PF) 100 MCG/2ML IJ SOLN: 25.0000 ug | INTRAMUSCULAR | Status: DC | PRN

## 2020-06-02 MED ORDER — CHLORHEXIDINE GLUCONATE 0.12 % MT SOLN
OROMUCOSAL | Status: AC
  Administered 2020-06-02: 15 mL
  Filled 2020-06-02: qty 15

## 2020-06-02 MED ORDER — LIDOCAINE 2% (20 MG/ML) 5 ML SYRINGE
INTRAMUSCULAR | Status: AC
  Filled 2020-06-02: qty 5

## 2020-06-02 MED ORDER — EPHEDRINE 5 MG/ML INJ
INTRAVENOUS | Status: AC
  Filled 2020-06-02: qty 10

## 2020-06-02 MED ORDER — PHENYLEPHRINE 40 MCG/ML (10ML) SYRINGE FOR IV PUSH (FOR BLOOD PRESSURE SUPPORT)
PREFILLED_SYRINGE | INTRAVENOUS | Status: AC
  Filled 2020-06-02: qty 10

## 2020-06-02 MED ORDER — EPHEDRINE SULFATE-NACL 50-0.9 MG/10ML-% IV SOSY
PREFILLED_SYRINGE | INTRAVENOUS | Status: DC | PRN
  Administered 2020-06-02: 10 mg via INTRAVENOUS
  Administered 2020-06-02: 5 mg via INTRAVENOUS
  Administered 2020-06-02 (×3): 10 mg via INTRAVENOUS
  Administered 2020-06-02: 5 mg via INTRAVENOUS

## 2020-06-02 MED ORDER — LIDOCAINE-EPINEPHRINE (PF) 1.5 %-1:200000 IJ SOLN
INTRAMUSCULAR | Status: DC | PRN
  Administered 2020-06-02: 30 mL via PERINEURAL

## 2020-06-02 MED ORDER — FENTANYL CITRATE (PF) 250 MCG/5ML IJ SOLN
INTRAMUSCULAR | Status: AC
  Filled 2020-06-02: qty 5

## 2020-06-02 MED ORDER — PROPOFOL 500 MG/50ML IV EMUL
INTRAVENOUS | Status: DC | PRN
  Administered 2020-06-02: 100 ug/kg/min via INTRAVENOUS

## 2020-06-02 MED ORDER — PHENYLEPHRINE 40 MCG/ML (10ML) SYRINGE FOR IV PUSH (FOR BLOOD PRESSURE SUPPORT)
PREFILLED_SYRINGE | INTRAVENOUS | Status: DC | PRN
  Administered 2020-06-02 (×4): 80 ug via INTRAVENOUS
  Administered 2020-06-02: 120 ug via INTRAVENOUS
  Administered 2020-06-02 (×2): 40 ug via INTRAVENOUS
  Administered 2020-06-02: 80 ug via INTRAVENOUS
  Administered 2020-06-02: 120 ug via INTRAVENOUS

## 2020-06-02 MED ORDER — FENTANYL CITRATE (PF) 100 MCG/2ML IJ SOLN
INTRAMUSCULAR | Status: DC | PRN
Start: 2020-06-02 — End: 2020-06-02
  Administered 2020-06-02 (×2): 50 ug via INTRAVENOUS

## 2020-06-02 MED ORDER — ACETAMINOPHEN 500 MG PO TABS
1000.0000 mg | ORAL_TABLET | Freq: Once | ORAL | Status: AC
  Administered 2020-06-02: 1000 mg via ORAL
  Filled 2020-06-02: qty 2

## 2020-06-02 MED ORDER — MIDAZOLAM HCL 5 MG/5ML IJ SOLN
INTRAMUSCULAR | Status: DC | PRN
  Administered 2020-06-02: 1 mg via INTRAVENOUS

## 2020-06-02 MED ORDER — PROPOFOL 10 MG/ML IV BOLUS
INTRAVENOUS | Status: AC
  Filled 2020-06-02: qty 20

## 2020-06-02 MED ORDER — SODIUM CHLORIDE 0.9 % IV SOLN
INTRAVENOUS | Status: DC | PRN
  Administered 2020-06-02: 500 mL

## 2020-06-02 MED ORDER — 0.9 % SODIUM CHLORIDE (POUR BTL) OPTIME
TOPICAL | Status: DC | PRN
  Administered 2020-06-02: 1000 mL

## 2020-06-02 MED ORDER — SODIUM CHLORIDE 0.9 % IV SOLN: INTRAVENOUS | Status: DC

## 2020-06-02 MED ORDER — MIDAZOLAM HCL 2 MG/2ML IJ SOLN
INTRAMUSCULAR | Status: AC
  Filled 2020-06-02: qty 2

## 2020-06-02 SURGICAL SUPPLY — 35 items
ADH SKN CLS APL DERMABOND .7 (GAUZE/BANDAGES/DRESSINGS) ×1
ARMBAND PINK RESTRICT EXTREMIT (MISCELLANEOUS) ×2 IMPLANT
CANISTER SUCT 3000ML PPV (MISCELLANEOUS) ×2 IMPLANT
CLIP VESOCCLUDE MED 6/CT (CLIP) ×2 IMPLANT
CLIP VESOCCLUDE SM WIDE 6/CT (CLIP) ×2 IMPLANT
COVER PROBE W GEL 5X96 (DRAPES) ×1 IMPLANT
COVER WAND RF STERILE (DRAPES) ×2 IMPLANT
DERMABOND ADVANCED (GAUZE/BANDAGES/DRESSINGS) ×1
DERMABOND ADVANCED .7 DNX12 (GAUZE/BANDAGES/DRESSINGS) ×1 IMPLANT
ELECT REM PT RETURN 9FT ADLT (ELECTROSURGICAL) ×2
ELECTRODE REM PT RTRN 9FT ADLT (ELECTROSURGICAL) ×1 IMPLANT
GLOVE BIO SURGEON STRL SZ7.5 (GLOVE) ×2 IMPLANT
GLOVE BIOGEL PI IND STRL 6.5 (GLOVE) IMPLANT
GLOVE BIOGEL PI IND STRL 8 (GLOVE) IMPLANT
GLOVE BIOGEL PI INDICATOR 6.5 (GLOVE) ×1
GLOVE BIOGEL PI INDICATOR 8 (GLOVE) ×2
GLOVE SKINSENSE NS SZ7.5 (GLOVE) ×1
GLOVE SKINSENSE STRL SZ7.5 (GLOVE) IMPLANT
GOWN STRL REUS W/ TWL LRG LVL3 (GOWN DISPOSABLE) ×2 IMPLANT
GOWN STRL REUS W/ TWL XL LVL3 (GOWN DISPOSABLE) ×1 IMPLANT
GOWN STRL REUS W/TWL LRG LVL3 (GOWN DISPOSABLE) ×4
GOWN STRL REUS W/TWL XL LVL3 (GOWN DISPOSABLE) ×2
INSERT FOGARTY SM (MISCELLANEOUS) IMPLANT
KIT BASIN OR (CUSTOM PROCEDURE TRAY) ×2 IMPLANT
KIT TURNOVER KIT B (KITS) ×2 IMPLANT
NS IRRIG 1000ML POUR BTL (IV SOLUTION) ×2 IMPLANT
PACK CV ACCESS (CUSTOM PROCEDURE TRAY) ×2 IMPLANT
PAD ARMBOARD 7.5X6 YLW CONV (MISCELLANEOUS) ×4 IMPLANT
SUT MNCRL AB 4-0 PS2 18 (SUTURE) ×2 IMPLANT
SUT PROLENE 6 0 BV (SUTURE) ×3 IMPLANT
SUT VIC AB 3-0 SH 27 (SUTURE) ×2
SUT VIC AB 3-0 SH 27X BRD (SUTURE) ×1 IMPLANT
TOWEL GREEN STERILE (TOWEL DISPOSABLE) ×2 IMPLANT
UNDERPAD 30X36 HEAVY ABSORB (UNDERPADS AND DIAPERS) ×2 IMPLANT
WATER STERILE IRR 1000ML POUR (IV SOLUTION) ×2 IMPLANT

## 2020-06-02 NOTE — Anesthesia Postprocedure Evaluation (Signed)
Anesthesia Post Note  Patient: [name]  Procedure(s) Performed: ARTERIOVENOUS (AV) FISTULA CREATION LEFT (Left Arm Upper)     Patient location during evaluation: PACU Anesthesia Type: Regional and MAC Level of consciousness: awake and alert Pain management: pain level controlled Vital Signs Assessment: post-procedure vital signs reviewed and stable Respiratory status: spontaneous breathing, nonlabored ventilation and respiratory function stable Cardiovascular status: stable and blood pressure returned to baseline Postop Assessment: no apparent nausea or vomiting Anesthetic complications: no   No complications documented.  Last Vitals:  Vitals:   06/02/20 0911 06/02/20 0941  BP: (!) 139/58 135/60  Pulse: (!) 56 (!) 58  Resp: 15 15  Temp: 36.4 C   SpO2: 97% 97%    Last Pain:  Vitals:   06/02/20 0911  TempSrc:   PainSc: 0-No pain                 Thermon Zulauf,W. EDMOND

## 2020-06-02 NOTE — Interval H&P Note (Signed)
History and Physical Interval Note:  06/02/2020 7:22 AM  [name]  has presented today for surgery, with the diagnosis of esrd.  The various methods of treatment have been discussed with the patient and family. After consideration of risks, benefits and other options for treatment, the patient has consented to  Procedure(s): ARTERIOVENOUS (AV) FISTULA CREATION LEFT (Left) as a surgical intervention.  The patient's history has been reviewed, patient examined, no change in status, stable for surgery.  I have reviewed the patient's chart and labs.  Questions were answered to the patient's satisfaction.     Servando Snare

## 2020-06-02 NOTE — Transfer of Care (Signed)
Immediate Anesthesia Transfer of Care Note  Patient: [name]  Procedure(s) Performed: ARTERIOVENOUS (AV) FISTULA CREATION LEFT (Left Arm Upper)  Patient Location: PACU  Anesthesia Type:MAC and Regional  Level of Consciousness: drowsy  Airway & Oxygen Therapy: Patient Spontanous Breathing and Patient connected to face mask oxygen  Post-op Assessment: Report given to RN and Post -op Vital signs reviewed and stable  Post vital signs: Reviewed and stable  Last Vitals:  Vitals Value Taken Time  BP 105/53 06/02/20 0841  Temp    Pulse 61 06/02/20 0842  Resp 13 06/02/20 0842  SpO2 98 % 06/02/20 0842  Vitals shown include unvalidated device data.  Last Pain:  Vitals:   06/02/20 0634  TempSrc:   PainSc: 6       Patients Stated Pain Goal: 3 (18/28/83 3744)  Complications: No complications documented.

## 2020-06-02 NOTE — Op Note (Signed)
    Patient name: MARKEES CARNS MRN: [id number] DOB: 01/26/1956 Sex: male  06/02/2020 Pre-operative Diagnosis: ckd Post-operative diagnosis:  Same Surgeon:  Erlene Quan C. Donzetta Matters, MD Assistant: Leory Plowman, MS3 Procedure Performed:   Left arm brachial artery to cephalic vein av fistula  Indications:  64yo WM with history of ckd, right hand dominant. He is in need of upper extremity access and we are planning for left AV fistula versus graft  Findings: The cephalic vein at the wrist actually measured 3 mm diameter but unfortunate appeared to have significant scar tissue.  Near the antecubitum it was large and healthy.  The brachial artery was 4 mm and healthy.  At completion there was a very strong thrill confirmed with Doppler and a palpable radial artery pulse at the wrist.   Procedure:  The patient was identified in the holding area and taken to the operating room recently supine on the operative table.  Preoperative block of been placed in the left upper extremity.  MAC anesthesia was induced he was sterilely prepped and draped in a timeout was called.  The block was tested was noted to be suitable.  We evaluated his upper extremity veins and arteries with ultrasound and decided for left brachial artery to cephalic vein fistula.  A transverse incision was made.  We dissected out the vein divided branches between ties.  We dissected through the deep fascia to the artery placed a vessel loop around this.  The vein was clamped distally and tied off more for orientation.  This was serially dilated to 4 mm flushed heparinized saline clamped.  The artery was clamped distally and proximally opened longitudinally and flushed with heparinized saline.  We sewed the vein end-to-side with 6-0 Prolene suture.  Prior to completion of the flushing directions.  Conclusion was a very strong thrill in the cephalic vein confirmed with Doppler and a palpable radial artery pulse at the wrist confirmed with Doppler.  Satisfied  we irrigated the wound and closed in layers with Vicryl and Monocryl.  He was then awakened from anesthesia having tolerated procedure without any complication but all counts were correct at completion.  EBL: 20 cc   Linden Mikes C. Donzetta Matters, MD [facility] Office: [phone] Pager: [phone]

## 2020-06-02 NOTE — Progress Notes (Signed)
Orthopedic Tech Progress Note Patient Details:  [name] [birth year] [id number] Left arm sling with RN Ortho Devices Type of Ortho Device: Arm sling Ortho Device/Splint Interventions: Ordered   Post Interventions Patient Tolerated: Other (comment) Instructions Provided: Other (comment)   [name] 06/02/2020, 10:04 AM

## 2020-06-02 NOTE — Discharge Instructions (Signed)
° °  [facility] ° °Discharge Instructions ° °AV Fistula or Graft Surgery for Dialysis Access ° °Please refer to the following instructions for your post-procedure care. Your surgeon or physician assistant will discuss any changes with you. ° °Activity ° °You may drive the day following your surgery, if you are comfortable and no longer taking prescription pain medication. Resume full activity as the soreness in your incision resolves. ° °Bathing/Showering ° °You may shower after you go home. Keep your incision dry for 48 hours. Do not soak in a bathtub, hot tub, or swim until the incision heals completely. You may not shower if you have a hemodialysis catheter. ° °Incision Care ° °Clean your incision with mild soap and water after 48 hours. Pat the area dry with a clean towel. You do not need a bandage unless otherwise instructed. Do not apply any ointments or creams to your incision. You may have skin glue on your incision. Do not peel it off. It will come off on its own in about one week. Your arm may swell a bit after surgery. To reduce swelling use pillows to elevate your arm so it is above your heart. Your doctor will tell you if you need to lightly wrap your arm with an ACE bandage. ° °Diet ° °Resume your normal diet. There are not special food restrictions following this procedure. In order to heal from your surgery, it is CRITICAL to get adequate nutrition. Your body requires vitamins, minerals, and protein. Vegetables are the best source of vitamins and minerals. Vegetables also provide the perfect balance of protein. Processed food has little nutritional value, so try to avoid this. ° °Medications ° °Resume taking all of your medications. If your incision is causing pain, you may take over-the counter pain relievers such as acetaminophen (Tylenol). If you were prescribed a stronger pain medication, please be aware these medications can cause nausea and constipation. Prevent  nausea by taking the medication with a snack or meal. Avoid constipation by drinking plenty of fluids and eating foods with high amount of fiber, such as fruits, vegetables, and grains. Do not take Tylenol if you are taking prescription pain medications. ° ° ° ° °Follow up °Your surgeon may want to see you in the office following your access surgery. If so, this will be arranged at the time of your surgery. ° °Please call us immediately for any of the following conditions: ° °Increased pain, redness, drainage (pus) from your incision site °Fever of 101 degrees or higher °Severe or worsening pain at your incision site °Hand pain or numbness. ° °Reduce your risk of vascular disease: ° °Stop smoking. If you would like help, call [facility] at 1-800-QUIT-NOW (1-800-784-8669) or [place] at [phone] ° °Manage your cholesterol °Maintain a desired weight °Control your diabetes °Keep your blood pressure down ° °Dialysis ° °It will take several weeks to several months for your new dialysis access to be ready for use. Your surgeon will determine when it is OK to use it. Your nephrologist will continue to direct your dialysis. You can continue to use your Permcath until your new access is ready for use. ° °If you have any questions, please call the office at [phone]. ° °

## 2020-06-02 NOTE — Anesthesia Procedure Notes (Signed)
Anesthesia Regional Block: Supraclavicular block   Pre-Anesthetic Checklist: ,, timeout performed, Correct Patient, Correct Site, Correct Laterality, Correct Procedure, Correct Position, site marked, Risks and benefits discussed, pre-op evaluation,  At surgeon's request and post-op pain management  Laterality: Left  Prep: Maximum Sterile Barrier Precautions used, chloraprep       Needles:  Injection technique: Single-shot  Needle Type: Echogenic Stimulator Needle     Needle Length: 9cm  Needle Gauge: 21     Additional Needles:   Procedures:,,,, ultrasound used (permanent image in chart),,,,  Narrative:  Start time: 06/02/2020 7:15 AM End time: 06/02/2020 7:25 AM Injection made incrementally with aspirations every 5 mL. Anesthesiologist: Roderic Palau, MD  Additional Notes: 2% Lidocaine skin wheel.

## 2020-06-03 ENCOUNTER — Other Ambulatory Visit (HOSPITAL_COMMUNITY): Payer: Self-pay | Admitting: *Deleted

## 2020-06-03 ENCOUNTER — Encounter (HOSPITAL_COMMUNITY): Payer: Self-pay | Admitting: Vascular Surgery

## 2020-06-06 ENCOUNTER — Ambulatory Visit (HOSPITAL_COMMUNITY)
Admission: RE | Admit: 2020-06-06 | Discharge: 2020-06-06 | Disposition: A | Discharge status: Home / Self Care | Payer: 59 | Source: Ambulatory Visit | Attending: Nephrology | Admitting: Nephrology

## 2020-06-06 ENCOUNTER — Other Ambulatory Visit: Payer: Self-pay

## 2020-06-06 DIAGNOSIS — D631 Anemia in chronic kidney disease: Secondary | ICD-10-CM | POA: Diagnosis present

## 2020-06-06 DIAGNOSIS — N189 Chronic kidney disease, unspecified: Secondary | ICD-10-CM | POA: Diagnosis not present

## 2020-06-06 LAB — POCT HEMOGLOBIN-HEMACUE: Hemoglobin: 11.7 g/dL — ABNORMAL LOW (ref 13.0–17.0)

## 2020-06-06 MED ORDER — EPOETIN ALFA-EPBX 10000 UNIT/ML IJ SOLN
20000.0000 [IU] | INTRAMUSCULAR | Status: DC
  Administered 2020-06-06: 20000 [IU] via SUBCUTANEOUS

## 2020-06-06 MED ORDER — SODIUM CHLORIDE 0.9 % IV SOLN
510.0000 mg | INTRAVENOUS | Status: DC
  Administered 2020-06-06: 510 mg via INTRAVENOUS
  Filled 2020-06-06: qty 510

## 2020-06-06 MED ORDER — EPOETIN ALFA-EPBX 10000 UNIT/ML IJ SOLN
INTRAMUSCULAR | Status: AC
  Filled 2020-06-06: qty 2

## 2020-06-13 ENCOUNTER — Encounter (INDEPENDENT_AMBULATORY_CARE_PROVIDER_SITE_OTHER): Payer: Self-pay | Admitting: Ophthalmology

## 2020-06-13 ENCOUNTER — Other Ambulatory Visit: Payer: Self-pay

## 2020-06-13 ENCOUNTER — Ambulatory Visit (INDEPENDENT_AMBULATORY_CARE_PROVIDER_SITE_OTHER): Payer: 59 | Admitting: Ophthalmology

## 2020-06-13 DIAGNOSIS — I6523 Occlusion and stenosis of bilateral carotid arteries: Secondary | ICD-10-CM

## 2020-06-13 DIAGNOSIS — E113412 Type 2 diabetes mellitus with severe nonproliferative diabetic retinopathy with macular edema, left eye: Secondary | ICD-10-CM | POA: Diagnosis not present

## 2020-06-13 DIAGNOSIS — E113411 Type 2 diabetes mellitus with severe nonproliferative diabetic retinopathy with macular edema, right eye: Secondary | ICD-10-CM

## 2020-06-13 MED ORDER — BEVACIZUMAB CHEMO INJECTION 1.25MG/0.05ML SYRINGE FOR KALEIDOSCOPE
1.2500 mg | INTRAVITREAL | Status: AC | PRN
  Administered 2020-06-13: 1.25 mg via INTRAVITREAL

## 2020-06-13 NOTE — Assessment & Plan Note (Signed)
6-week interval, CSME is also improved, will need PRP early in the future as well

## 2020-06-13 NOTE — Progress Notes (Signed)
06/13/2020     CHIEF COMPLAINT Patient presents for Retina Follow Up   HISTORY OF PRESENT ILLNESS: [name] is a 64 y.o. male who presents to the clinic today for:   HPI    Retina Follow Up    Patient presents with  Retinal Break/Detachment.  In right eye.  Severity is severe.  Duration of 8 weeks.  Since onset it is stable.  I, the attending physician,  performed the HPI with the patient and updated documentation appropriately.          Comments    8 Week NPDR f\u OS. Possible Avastin OS. OCT  Pt states no changes in vision. Pt states OU have mucus in them often. BGL: 230       Last edited by Tilda Franco on 06/13/2020  2:37 PM. (History)      Referring physician: [name], PA-C [place],  [place] [address]  HISTORICAL INFORMATION:   Selected notes from the MEDICAL RECORD NUMBER    Lab Results  Component Value Date   HGBA1C 5.7 (H) 09/16/2018     CURRENT MEDICATIONS: Current Outpatient Medications (Ophthalmic Drugs)  Medication Sig  . Polyvinyl Alcohol-Povidone PF (REFRESH) 1.4-0.6 % SOLN Place 1 drop into both eyes 2 (two) times daily as needed (dry eyes).    No current facility-administered medications for this visit. (Ophthalmic Drugs)   Current Outpatient Medications (Other)  Medication Sig  . amLODipine (NORVASC) 10 MG tablet Take 1 tablet (10 mg total) by mouth daily.  Marland Kitchen amoxicillin (AMOXIL) 250 MG chewable tablet Chew 250 mg by mouth 2 (two) times daily.  Marland Kitchen aspirin EC 81 MG tablet Take 81 mg by mouth daily.  Marland Kitchen atorvastatin (LIPITOR) 40 MG tablet Take 40 mg by mouth at bedtime.   Marland Kitchen BIDIL 20-37.5 MG tablet Take 1 tablet by mouth 3 (three) times daily.  . carvedilol (COREG) 25 MG tablet Take 1.5 tablets (37.5 mg total) by mouth 2 (two) times daily.  . clobetasol (TEMOVATE) 0.05 % external solution Apply 1 application topically daily as needed (dry scalp).  . clopidogrel (PLAVIX) 75 MG tablet Take 75 mg by mouth daily.   .  fentaNYL (DURAGESIC - DOSED MCG/HR) 50 MCG/HR Place 50 mcg onto the skin every 3 (three) days.  . ferrous gluconate (FERGON) 324 MG tablet Take 324 mg by mouth 2 (two) times daily.  . fluticasone (CUTIVATE) 0.05 % cream Apply 1 application topically 2 (two) times daily as needed (dry skin on face).   . folic acid (FOLVITE) 1 MG tablet Take 1 mg by mouth daily.  . Glucosamine HCl (GLUCOSAMINE PO) Take 1 tablet by mouth 2 (two) times daily.   Marland Kitchen HUMALOG KWIKPEN 100 UNIT/ML KiwkPen Inject 15-16 Units into the skin See admin instructions. Inject 13 units before breakfast, lunch, & dinner--  . insulin glargine (LANTUS) 100 UNIT/ML injection Inject 55 Units into the skin at bedtime.  Marland Kitchen ketoconazole (NIZORAL) 2 % cream Apply 1 application topically 2 (two) times daily as needed for irritation.   Marland Kitchen linaclotide (LINZESS) 145 MCG CAPS capsule Take 145 mcg by mouth daily as needed (constipation).   Marland Kitchen loratadine (CLARITIN) 10 MG tablet Take 10 mg by mouth daily.  . Multiple Vitamin (MULTIVITAMIN) tablet Take 1 tablet by mouth daily.  Marland Kitchen NEEDLE, REUSABLE, 22 G 22G X 1-1/2" MISC 1 Units by Does not apply route as directed. For B12 IM inj  . omega-3 acid ethyl esters (LOVAZA) 1 g capsule  Take 2 g by mouth 2 (two) times daily.  . ondansetron (ZOFRAN) 4 MG tablet Take 4 mg by mouth every 6 (six) hours as needed for nausea or vomiting.  Marland Kitchen oxyCODONE-acetaminophen (PERCOCET) 7.5-325 MG tablet Take 1 tablet by mouth every 8 (eight) hours.  . pantoprazole (PROTONIX) 40 MG tablet Take 40 mg by mouth daily.  . pregabalin (LYRICA) 100 MG capsule Take 1 capsule (100 mg total) by mouth 3 (three) times daily.  Marland Kitchen rOPINIRole (REQUIP) 2 MG tablet Take 2 mg by mouth at bedtime.   . Syringe/Needle, Disp, (SYRINGE 3CC/22GX1-1/2") 22G X 1-1/2" 3 ML MISC 1 Syringe by Does not apply route as directed. For b12 IM inj  . tamsulosin (FLOMAX) 0.4 MG CAPS capsule Take 0.4 mg by mouth daily.  Marland Kitchen torsemide (DEMADEX) 20 MG tablet Take 20 mg by  mouth See admin instructions. Take 20 mg daily, may take a second 20 mg dose before 1500 as needed for swelling   No current facility-administered medications for this visit. (Other)      REVIEW OF SYSTEMS: ROS    Positive for: Endocrine   Last edited by Tilda Franco on 06/13/2020  2:36 PM. (History)       ALLERGIES Allergies  Allergen Reactions  . Contrast Media [Iodinated Diagnostic Agents]     Difficulty breathing    . Ivp Dye [Iodinated Diagnostic Agents] Anaphylaxis and Other (See Comments)    Breathing problems, altered mental state   . Adhesive [Tape] Rash    Rash after 3-4 days use    PAST MEDICAL HISTORY Past Medical History:  Diagnosis Date  . Carotid artery occlusion 11/10/10   LEFT CAROTID ENDARTERECTOMY  . Complication of anesthesia    BP WENT UP AT [facility] "  . COPD (chronic obstructive pulmonary disease) ([place])    pt denies this dx as of 06/01/20 - no inhaler   . Diverticulitis   . Diverticulosis of colon (without mention of hemorrhage)   . DJD (degenerative joint disease)    knees/hands/feet/back/neck  . Fatty liver   . Full dentures   . GERD (gastroesophageal reflux disease)   . H/O hiatal hernia   . History of blood transfusion    with a past surical procedure per patient 06/01/20  . Hyperlipidemia   . Hypertension   . Neuromuscular disorder ([place])    peripheral neuropathy  . Non-pressure chronic ulcer of other part of left foot limited to breakdown of skin ([place]) 11/12/2016  . Osteomyelitis ([place])    left 5th metatarsal  . PAD (peripheral artery disease) ([place])    Distal aortogram June 2012. Atherectomy left popliteal artery July 2012.   . Pseudoclaudication 11/15/2018  . Sleep apnea    pt denies this dx as of 06/01/20  . Slurred speech    AS PER WIFE IN D/C NOTE 11/10/10  . 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 Unstable angina ([place]) 09/16/2018  . Wears glasses    Past Surgical History:  Procedure Laterality Date  . AMPUTATION  11/05/2011   Procedure:  AMPUTATION RAY;  Surgeon: Wylene Simmer, MD;  Location: [facility];  Service: Orthopedics;  Laterality: Right;  Amputation of Right 4&5th Toes  . AMPUTATION Left 11/26/2012   Procedure: AMPUTATION RAY;  Surgeon: Wylene Simmer, MD;  Location: [facility];  Service: Orthopedics;  Laterality: Left;  fourth ray amputation  . AMPUTATION Right 08/27/2014   Procedure: Transmetatarsal Amputation;  Surgeon: Newt Minion, MD;  Location: [facility];  Service: Orthopedics;  Laterality: Right;  .  AMPUTATION Right 01/14/2015   Procedure: AMPUTATION BELOW KNEE;  Surgeon: Newt Minion, MD;  Location: [facility];  Service: Orthopedics;  Laterality: Right;  . AMPUTATION Left 10/21/2015   Procedure: Left Foot 5th Ray Amputation;  Surgeon: Newt Minion, MD;  Location: [facility];  Service: Orthopedics;  Laterality: Left;  . ANTERIOR FUSION CERVICAL SPINE  02/06/06   C4-5, C5-6, C6-7; SURGEON DR. MAX COHEN  . AV FISTULA PLACEMENT Left 06/02/2020   Procedure: ARTERIOVENOUS (AV) FISTULA CREATION LEFT;  Surgeon: Waynetta Sandy, MD;  Location: [facility];  Service: Vascular;  Laterality: Left;  . BACK SURGERY     x 3  . BELOW KNEE LEG AMPUTATION Right   . CARDIAC CATHETERIZATION  10/31/04   2009  . CAROTID ENDARTERECTOMY  11/10/10  . CAROTID ENDARTERECTOMY Left 11/10/2010   Subtotal occlusion of left internal carotid artery with left hemispheric transient ischemic attacks.  . CAROTID STENT    . CARPAL TUNNEL RELEASE Right 10/21/2013   Procedure: RIGHT CARPAL TUNNEL RELEASE;  Surgeon: Wynonia Sours, MD;  Location: [facility];  Service: Orthopedics;  Laterality: Right;  . CHOLECYSTECTOMY    . COLON SURGERY    . COLONOSCOPY    . COLOSTOMY REVERSAL  05/21/2018   ileostomy reversal  . CYSTOSCOPY WITH STENT PLACEMENT Bilateral 01/13/2018   Procedure: CYSTOSCOPY WITH BILATERAL URETERAL CATHETER PLACEMENT;  Surgeon: Ardis Hughs, MD;  Location: [facility];  Service: Urology;  Laterality: Bilateral;  . ESOPHAGEAL MANOMETRY Bilateral  07/19/2014   Procedure: ESOPHAGEAL MANOMETRY (EM);  Surgeon: Jerene Bears, MD;  Location: [facility];  Service: Gastroenterology;  Laterality: Bilateral;  . FEMORAL ARTERY STENT     x6  . FINGER SURGERY    . FOOT SURGERY  04/25/2016    EXCISION BASE 5TH METATARSAL AND PARTIAL CUBOID LEFT FOOT  . HERNIA REPAIR     LEFT INGUINAL AND UMBILICAL REPAIRS  . HERNIA REPAIR    . I & D EXTREMITY Left 04/25/2016   Procedure: EXCISION BASE 5TH METATARSAL AND PARTIAL CUBOID LEFT FOOT;  Surgeon: Newt Minion, MD;  Location: [facility];  Service: Orthopedics;  Laterality: Left;  . ILEOSTOMY  01/13/2018   Procedure: ILEOSTOMY;  Surgeon: Clovis Riley, MD;  Location: [facility];  Service: General;;  . ILEOSTOMY CLOSURE N/A 05/21/2018   Procedure: ILEOSTOMY REVERSAL ERAS PATHWAY;  Surgeon: Clovis Riley, MD;  Location: [facility];  Service: General;  Laterality: N/A;  . IR RADIOLOGIST EVAL & MGMT  11/19/2017  . IR RADIOLOGIST EVAL & MGMT  12/03/2017  . IR RADIOLOGIST EVAL & MGMT  12/18/2017  . JOINT REPLACEMENT Right 2001   Total knee  . LAMINECTOMY     X 3 LUMBAR AND X 2 CERVICAL SPINE OPERATIONS  . LAPAROSCOPIC CHOLECYSTECTOMY W/ CHOLANGIOGRAPHY  11/09/04   SURGEON DR. Luella Cook  . LEFT HEART CATH AND CORONARY ANGIOGRAPHY N/A 09/16/2018   Procedure: LEFT HEART CATH AND CORONARY ANGIOGRAPHY;  Surgeon: Nigel Mormon, MD;  Location: [facility];  Service: Cardiovascular;  Laterality: N/A;  . LEFT HEART CATHETERIZATION WITH CORONARY ANGIOGRAM N/A 10/29/2014   Procedure: LEFT HEART CATHETERIZATION WITH CORONARY ANGIOGRAM;  Surgeon: Laverda Page, MD;  Location: [facility];  Service: Cardiovascular;  Laterality: N/A;  . LOWER EXTREMITY ANGIOGRAM N/A 03/19/2012   Procedure: LOWER EXTREMITY ANGIOGRAM;  Surgeon: Burnell Blanks, MD;  Location: [facility];  Service: Cardiovascular;  Laterality: N/A;  . NECK SURGERY    .  PARTIAL COLECTOMY N/A 01/13/2018   Procedure: LAPAROSCOPIC ASSISTED   SIGMOID  COLECTOMY ILEOSTOMY;  Surgeon: Clovis Riley, MD;  Location[facility];  Service: General;  Laterality: N/A;  . PENILE PROSTHESIS IMPLANT  08/14/05   INFRAPUBIC INSERTION OF INFLATABLE PENILE PROSTHESIS; SURGEON DR. Amalia Hailey  . PENILE PROSTHESIS IMPLANT    . PERCUTANEOUS CORONARY STENT INTERVENTION (PCI-S) Right 10/29/2014   Procedure: PERCUTANEOUS CORONARY STENT INTERVENTION (PCI-S);  Surgeon: Laverda Page, MD;  Location: [facility] CATH LAB;  Service: Cardiovascular;  Laterality: Right;  . SHOULDER ARTHROSCOPY    . SPINE SURGERY    . TOE AMPUTATION Left   . TONSILLECTOMY    . TOTAL KNEE ARTHROPLASTY  07/2002   RIGHT KNEE ; SURGEON  DR. [name] ALSO HAD ARTHROSCOPIC RIGHT KNEE IN  10/2001  . TOTAL KNEE ARTHROPLASTY    . ULNAR NERVE TRANSPOSITION Right 10/21/2013   Procedure: RIGHT ELBOW  ULNAR NERVE DECOMPRESSION;  Surgeon: Wynonia Sours, MD;  Location: [facility];  Service: Orthopedics;  Laterality: Right;    FAMILY HISTORY Family History  Problem Relation Age of Onset  . Heart disease Father        Before age 25-  CAD, BPG  . Diabetes Father        Amputation  . Cancer Father        PROSTATE  . Hyperlipidemia Father   . Hypertension Father   . Heart attack Father        Triple BPG  . Varicose Veins Father   . Colon cancer Brother   . Diabetes Brother   . Heart disease Brother 79       A-Fib. Before age 38  . Hyperlipidemia Brother   . Hypertension Brother   . Cancer Sister        Breast  . Hyperlipidemia Sister   . Hypertension Sister   . Hypertension Son   . Arthritis Other        GRANDMOTHER  . Hypertension Other        OTHER FAMILY MEMBERS    SOCIAL HISTORY Social History   Tobacco Use  . Smoking status: Current Every Day Smoker    Packs/day: 2.00    Years: 35.00    Pack years: 70.00    Types: Cigarettes, E-cigarettes    Last attempt to quit: 10/28/2011    Years since quitting: 8.6  . Smokeless tobacco: Never Used  Vaping Use  . Vaping Use: Every  day  . Substances: Nicotine  Substance Use Topics  . Alcohol use: Not Currently    Comment: "not in a long time"  . Drug use: Not Currently         OPHTHALMIC EXAM: Base Eye Exam    Visual Acuity (Snellen - Linear)      Right Left   Dist [place] 20/50 20/25   Dist ph [place] 20/30 20/20 -1       Tonometry (Tonopen, 2:42 PM)      Right Left   Pressure 10 11       Pupils      Pupils Dark Light Shape React APD   Right PERRL 3 2 Round Brisk None   Left PERRL 3 2 Round Brisk None       Visual Fields (Counting fingers)      Left Right    Full Full       Neuro/Psych    Oriented x3: Yes   Mood/Affect: Normal       Dilation  Left eye: 1.0% Mydriacyl, 2.5% Phenylephrine @ 2:42 PM        Slit Lamp and Fundus Exam    External Exam      Right Left   External Normal Normal       Slit Lamp Exam      Right Left   Lids/Lashes Normal Normal   Conjunctiva/Sclera White and quiet White and quiet   Cornea Clear Clear   Anterior Chamber Deep and quiet Deep and quiet   Iris Round and reactive Round and reactive   Lens Posterior chamber intraocular lens Posterior chamber intraocular lens   Anterior Vitreous Normal Normal       Fundus Exam      Right Left   Posterior Vitreous  Posterior vitreous detachment   Disc  Normal   C/D Ratio  0.25   Macula  Clinically significant macular edema, Exudates, Microaneurysms   Vessels  NPDR-Severe   Periphery  Normal          IMAGING AND PROCEDURES  Imaging and Procedures for 06/13/20  OCT, Retina - OU - Both Eyes       Right Eye Scan locations included subfoveal. Central Foveal Thickness: 262. Progression has improved. Findings include cystoid macular edema.   Left Eye Quality was good. Scan locations included subfoveal. Central Foveal Thickness: 276. Progression has improved. Findings include cystoid macular edema.   Notes OU, improved OD currently at 6-week interval post intravitreal Avastin.  OS CSME on 8-week interval  also vastly improved.        Intravitreal Injection, Pharmacologic Agent - OS - Left Eye       Time Out 06/13/2020. 3:47 PM. Confirmed correct patient, procedure, site, and patient consented.   Anesthesia Topical anesthesia was used. Anesthetic medications included [place] 3.5%.   Procedure Preparation included Tobramycin 0.3%, 10% betadine to eyelids, 5% betadine to ocular surface. A 30 gauge needle was used.   Injection:  1.25 mg Bevacizumab (AVASTIN) SOLN   NDC: 70360-001-02, Lot: [id number]   Route: Intravitreal, Site: Left Eye, Waste: 0 mg  Post-op Post injection exam found visual acuity of at least counting fingers. The patient tolerated the procedure well. There were no complications. The patient received written and verbal post procedure care education. Post injection medications were not given.                 ASSESSMENT/PLAN:  Severe nonproliferative diabetic retinopathy of left eye, with macular edema, associated with type 2 diabetes mellitus (HCC) Diabetic CSME as has improved and stabilized peripheral retinal nonperfusion from severe NPDR persist with excessive vegF burden  Severe nonproliferative diabetic retinopathy of right eye, with macular edema, associated with type 2 diabetes mellitus (HCC) 6-week interval, CSME is also improved, will need PRP early in the future as well      ICD-10-CM   1. Severe nonproliferative diabetic retinopathy of left eye, with macular edema, associated with type 2 diabetes mellitus (HCC)  A19.3790 OCT, Retina - OU - Both Eyes    Intravitreal Injection, Pharmacologic Agent - OS - Left Eye    Bevacizumab (AVASTIN) SOLN 1.25 mg  2. Severe nonproliferative diabetic retinopathy of right eye, with macular edema, associated with type 2 diabetes mellitus ([place])  E11.3411     1.  Repeat intravitreal Avastin OS today.  Will maintain follow-up in the left eye in 8 weeks for consideration of control of CSME.  PRP left eye in 4 weeks to  decrease vegF burden  2.  D as scheduled  3.  Ophthalmic Meds Ordered this visit:  Meds ordered this encounter  Medications  . Bevacizumab (AVASTIN) SOLN 1.25 mg       Return in about 4 weeks (around 07/11/2020) for OS, PRP.  There are no Patient Instructions on file for this visit.   Explained the diagnoses, plan, and follow up with the patient and they expressed understanding.  Patient expressed understanding of the importance of proper follow up care.   Clent Demark Eureka Valdes M.D. Diseases & Surgery of the Retina and Vitreous Retina & Diabetic [place] 06/13/20     Abbreviations: M myopia (nearsighted); A astigmatism; H hyperopia (farsighted); P presbyopia; Mrx spectacle prescription;  CTL contact lenses; OD right eye; OS left eye; OU both eyes  XT exotropia; ET esotropia; PEK punctate epithelial keratitis; PEE punctate epithelial erosions; DES dry eye syndrome; MGD meibomian gland dysfunction; ATs artificial tears; PFAT's preservative free artificial tears; [place] nuclear sclerotic cataract; PSC posterior subcapsular cataract; ERM epi-retinal membrane; PVD posterior vitreous detachment; RD retinal detachment; DM diabetes mellitus; DR diabetic retinopathy; NPDR non-proliferative diabetic retinopathy; PDR proliferative diabetic retinopathy; CSME clinically significant macular edema; DME diabetic macular edema; dbh dot blot hemorrhages; CWS cotton wool spot; POAG primary open angle glaucoma; C/D cup-to-disc ratio; HVF humphrey visual field; GVF goldmann visual field; OCT optical coherence tomography; IOP intraocular pressure; BRVO Branch retinal vein occlusion; CRVO central retinal vein occlusion; CRAO central retinal artery occlusion; BRAO branch retinal artery occlusion; RT retinal tear; SB scleral buckle; PPV pars plana vitrectomy; VH Vitreous hemorrhage; PRP panretinal laser photocoagulation; IVK intravitreal kenalog; VMT vitreomacular traction; MH Macular hole;  NVD neovascularization of  the disc; NVE neovascularization elsewhere; AREDS age related eye disease study; ARMD age related macular degeneration; POAG primary open angle glaucoma; EBMD epithelial/anterior basement membrane dystrophy; ACIOL anterior chamber intraocular lens; IOL intraocular lens; PCIOL posterior chamber intraocular lens; Phaco/IOL phacoemulsification with intraocular lens placement; [place] photorefractive keratectomy; LASIK laser assisted in situ keratomileusis; HTN hypertension; DM diabetes mellitus; COPD chronic obstructive pulmonary disease

## 2020-06-13 NOTE — Assessment & Plan Note (Signed)
Diabetic CSME as has improved and stabilized peripheral retinal nonperfusion from severe NPDR persist with excessive vegF burden

## 2020-06-15 ENCOUNTER — Other Ambulatory Visit: Payer: Self-pay

## 2020-06-15 DIAGNOSIS — I6529 Occlusion and stenosis of unspecified carotid artery: Secondary | ICD-10-CM | POA: Insufficient documentation

## 2020-06-15 DIAGNOSIS — N186 End stage renal disease: Secondary | ICD-10-CM

## 2020-06-15 DIAGNOSIS — E785 Hyperlipidemia, unspecified: Secondary | ICD-10-CM | POA: Insufficient documentation

## 2020-06-16 ENCOUNTER — Encounter (INDEPENDENT_AMBULATORY_CARE_PROVIDER_SITE_OTHER): Payer: 59 | Admitting: Ophthalmology

## 2020-06-20 ENCOUNTER — Other Ambulatory Visit: Payer: Self-pay

## 2020-06-20 ENCOUNTER — Encounter (HOSPITAL_COMMUNITY)
Admission: RE | Admit: 2020-06-20 | Discharge: 2020-06-20 | Disposition: A | Discharge status: Home / Self Care | Payer: 59 | Source: Ambulatory Visit | Attending: Nephrology | Admitting: Nephrology

## 2020-06-20 DIAGNOSIS — D631 Anemia in chronic kidney disease: Secondary | ICD-10-CM | POA: Insufficient documentation

## 2020-06-20 DIAGNOSIS — N189 Chronic kidney disease, unspecified: Secondary | ICD-10-CM | POA: Insufficient documentation

## 2020-06-20 LAB — POCT HEMOGLOBIN-HEMACUE: Hemoglobin: 11.6 g/dL — ABNORMAL LOW (ref 13.0–17.0)

## 2020-06-20 MED ORDER — EPOETIN ALFA-EPBX 10000 UNIT/ML IJ SOLN
INTRAMUSCULAR | Status: AC
  Filled 2020-06-20: qty 2

## 2020-06-20 MED ORDER — SODIUM CHLORIDE 0.9 % IV SOLN
510.0000 mg | INTRAVENOUS | Status: DC
  Administered 2020-06-20: 510 mg via INTRAVENOUS
  Filled 2020-06-20: qty 17

## 2020-06-20 MED ORDER — EPOETIN ALFA-EPBX 10000 UNIT/ML IJ SOLN
20000.0000 [IU] | INTRAMUSCULAR | Status: DC
  Administered 2020-06-20: 20000 [IU] via SUBCUTANEOUS

## 2020-06-27 ENCOUNTER — Encounter (INDEPENDENT_AMBULATORY_CARE_PROVIDER_SITE_OTHER): Payer: Self-pay | Admitting: Ophthalmology

## 2020-06-27 ENCOUNTER — Other Ambulatory Visit: Payer: Self-pay

## 2020-06-27 ENCOUNTER — Ambulatory Visit (INDEPENDENT_AMBULATORY_CARE_PROVIDER_SITE_OTHER): Payer: 59 | Admitting: Ophthalmology

## 2020-06-27 DIAGNOSIS — I6523 Occlusion and stenosis of bilateral carotid arteries: Secondary | ICD-10-CM

## 2020-06-27 DIAGNOSIS — E113412 Type 2 diabetes mellitus with severe nonproliferative diabetic retinopathy with macular edema, left eye: Secondary | ICD-10-CM | POA: Diagnosis not present

## 2020-06-27 DIAGNOSIS — E113411 Type 2 diabetes mellitus with severe nonproliferative diabetic retinopathy with macular edema, right eye: Secondary | ICD-10-CM | POA: Diagnosis not present

## 2020-06-27 MED ORDER — AFLIBERCEPT 2MG/0.05ML IZ SOLN FOR KALEIDOSCOPE
2.0000 mg | INTRAVITREAL | Status: AC | PRN
  Administered 2020-06-27: 2 mg via INTRAVITREAL

## 2020-06-27 NOTE — Progress Notes (Signed)
06/27/2020     CHIEF COMPLAINT Patient presents for Retina Follow Up   HISTORY OF PRESENT ILLNESS: [name] is a 64 y.o. male who presents to the clinic today for:   HPI    Retina Follow Up    Patient presents with  Diabetic Retinopathy.  In right eye.  This started 9 weeks ago.  Severity is mild.  Duration of 9 weeks.  Since onset it is stable.          Comments    9 Week Diabetic F/U OD, poss Eylea OD  Pt denies noticeable changes to [place] OU since last visit. Pt denies ocular pain, flashes of light, or floaters OU.  LBS: 181 this AM       Last edited by Rockie Neighbours, [place] on 06/27/2020  2:37 PM. (History)      Referring physician: [name], PA-C [place],  [place] [address]  HISTORICAL INFORMATION:   Selected notes from the MEDICAL RECORD NUMBER    Lab Results  Component Value Date   HGBA1C 5.7 (H) 09/16/2018     CURRENT MEDICATIONS: Current Outpatient Medications (Ophthalmic Drugs)  Medication Sig  . Polyvinyl Alcohol-Povidone PF (REFRESH) 1.4-0.6 % SOLN Place 1 drop into both eyes 2 (two) times daily as needed (dry eyes).    No current facility-administered medications for this visit. (Ophthalmic Drugs)   Current Outpatient Medications (Other)  Medication Sig  . amLODipine (NORVASC) 10 MG tablet Take 1 tablet (10 mg total) by mouth daily.  Marland Kitchen amoxicillin (AMOXIL) 250 MG chewable tablet Chew 250 mg by mouth 2 (two) times daily.  Marland Kitchen aspirin EC 81 MG tablet Take 81 mg by mouth daily.  Marland Kitchen atorvastatin (LIPITOR) 40 MG tablet Take 40 mg by mouth at bedtime.   Marland Kitchen BIDIL 20-37.5 MG tablet Take 1 tablet by mouth 3 (three) times daily.  . carvedilol (COREG) 25 MG tablet Take 1.5 tablets (37.5 mg total) by mouth 2 (two) times daily.  . clobetasol (TEMOVATE) 0.05 % external solution Apply 1 application topically daily as needed (dry scalp).  . clopidogrel (PLAVIX) 75 MG tablet Take 75 mg by mouth daily.   . fentaNYL (DURAGESIC - DOSED MCG/HR) 50  MCG/HR Place 50 mcg onto the skin every 3 (three) days.  . ferrous gluconate (FERGON) 324 MG tablet Take 324 mg by mouth 2 (two) times daily.  . fluticasone (CUTIVATE) 0.05 % cream Apply 1 application topically 2 (two) times daily as needed (dry skin on face).   . folic acid (FOLVITE) 1 MG tablet Take 1 mg by mouth daily.  . Glucosamine HCl (GLUCOSAMINE PO) Take 1 tablet by mouth 2 (two) times daily.   Marland Kitchen HUMALOG KWIKPEN 100 UNIT/ML KiwkPen Inject 15-16 Units into the skin See admin instructions. Inject 13 units before breakfast, lunch, & dinner--  . insulin glargine (LANTUS) 100 UNIT/ML injection Inject 55 Units into the skin at bedtime.  Marland Kitchen ketoconazole (NIZORAL) 2 % cream Apply 1 application topically 2 (two) times daily as needed for irritation.   Marland Kitchen linaclotide (LINZESS) 145 MCG CAPS capsule Take 145 mcg by mouth daily as needed (constipation).   Marland Kitchen loratadine (CLARITIN) 10 MG tablet Take 10 mg by mouth daily.  . Multiple Vitamin (MULTIVITAMIN) tablet Take 1 tablet by mouth daily.  Marland Kitchen NEEDLE, REUSABLE, 22 G 22G X 1-1/2" MISC 1 Units by Does not apply route as directed. For B12 IM inj  . omega-3 acid ethyl esters (LOVAZA) 1 g capsule Take 2  g by mouth 2 (two) times daily.  . ondansetron (ZOFRAN) 4 MG tablet Take 4 mg by mouth every 6 (six) hours as needed for nausea or vomiting.  Marland Kitchen oxyCODONE-acetaminophen (PERCOCET) 7.5-325 MG tablet Take 1 tablet by mouth every 8 (eight) hours.  . pantoprazole (PROTONIX) 40 MG tablet Take 40 mg by mouth daily.  . pregabalin (LYRICA) 100 MG capsule Take 1 capsule (100 mg total) by mouth 3 (three) times daily.  Marland Kitchen rOPINIRole (REQUIP) 2 MG tablet Take 2 mg by mouth at bedtime.   . Syringe/Needle, Disp, (SYRINGE 3CC/22GX1-1/2") 22G X 1-1/2" 3 ML MISC 1 Syringe by Does not apply route as directed. For b12 IM inj  . tamsulosin (FLOMAX) 0.4 MG CAPS capsule Take 0.4 mg by mouth daily.  Marland Kitchen torsemide (DEMADEX) 20 MG tablet Take 20 mg by mouth See admin instructions. Take 20  mg daily, may take a second 20 mg dose before 1500 as needed for swelling   No current facility-administered medications for this visit. (Other)      REVIEW OF SYSTEMS:    ALLERGIES Allergies  Allergen Reactions  . Contrast Media [Iodinated Diagnostic Agents]     Difficulty breathing    . Ivp Dye [Iodinated Diagnostic Agents] Anaphylaxis and Other (See Comments)    Breathing problems, altered mental state   . Adhesive [Tape] Rash    Rash after 3-4 days use    PAST MEDICAL HISTORY Past Medical History:  Diagnosis Date  . Carotid artery occlusion 11/10/10   LEFT CAROTID ENDARTERECTOMY  . Complication of anesthesia    BP WENT UP AT [facility] "  . COPD (chronic obstructive pulmonary disease) ([place])    pt denies this dx as of 06/01/20 - no inhaler   . Diverticulitis   . Diverticulosis of colon (without mention of hemorrhage)   . DJD (degenerative joint disease)    knees/hands/feet/back/neck  . Fatty liver   . Full dentures   . GERD (gastroesophageal reflux disease)   . H/O hiatal hernia   . History of blood transfusion    with a past surical procedure per patient 06/01/20  . Hyperlipidemia   . Hypertension   . Neuromuscular disorder ([place])    peripheral neuropathy  . Non-pressure chronic ulcer of other part of left foot limited to breakdown of skin ([place]) 11/12/2016  . Osteomyelitis ([place])    left 5th metatarsal  . PAD (peripheral artery disease) ([place])    Distal aortogram June 2012. Atherectomy left popliteal artery July 2012.   . Pseudoclaudication 11/15/2018  . Sleep apnea    pt denies this dx as of 06/01/20  . Slurred speech    AS PER WIFE IN D/C NOTE 11/10/10  . 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 Unstable angina ([place]) 09/16/2018  . Wears glasses    Past Surgical History:  Procedure Laterality Date  . AMPUTATION  11/05/2011   Procedure: AMPUTATION RAY;  Surgeon: Wylene Simmer, MD;  Location: [facility];  Service: Orthopedics;  Laterality: Right;  Amputation of Right 4&5th Toes  .  AMPUTATION Left 11/26/2012   Procedure: AMPUTATION RAY;  Surgeon: Wylene Simmer, MD;  Location: [facility];  Service: Orthopedics;  Laterality: Left;  fourth ray amputation  . AMPUTATION Right 08/27/2014   Procedure: Transmetatarsal Amputation;  Surgeon: Newt Minion, MD;  Location: [facility];  Service: Orthopedics;  Laterality: Right;  . AMPUTATION Right 01/14/2015   Procedure: AMPUTATION BELOW KNEE;  Surgeon: Newt Minion, MD;  Location: [facility];  Service: Orthopedics;  Laterality: Right;  .  AMPUTATION Left 10/21/2015   Procedure: Left Foot 5th Ray Amputation;  Surgeon: Newt Minion, MD;  Location: [facility];  Service: Orthopedics;  Laterality: Left;  . ANTERIOR FUSION CERVICAL SPINE  02/06/06   C4-5, C5-6, C6-7; SURGEON DR. MAX COHEN  . AV FISTULA PLACEMENT Left 06/02/2020   Procedure: ARTERIOVENOUS (AV) FISTULA CREATION LEFT;  Surgeon: Waynetta Sandy, MD;  Location: [facility];  Service: Vascular;  Laterality: Left;  . BACK SURGERY     x 3  . BELOW KNEE LEG AMPUTATION Right   . CARDIAC CATHETERIZATION  10/31/04   2009  . CAROTID ENDARTERECTOMY  11/10/10  . CAROTID ENDARTERECTOMY Left 11/10/2010   Subtotal occlusion of left internal carotid artery with left hemispheric transient ischemic attacks.  . CAROTID STENT    . CARPAL TUNNEL RELEASE Right 10/21/2013   Procedure: RIGHT CARPAL TUNNEL RELEASE;  Surgeon: Wynonia Sours, MD;  Location: [facility];  Service: Orthopedics;  Laterality: Right;  . CHOLECYSTECTOMY    . COLON SURGERY    . COLONOSCOPY    . COLOSTOMY REVERSAL  05/21/2018   ileostomy reversal  . CYSTOSCOPY WITH STENT PLACEMENT Bilateral 01/13/2018   Procedure: CYSTOSCOPY WITH BILATERAL URETERAL CATHETER PLACEMENT;  Surgeon: Ardis Hughs, MD;  Location: [facility];  Service: Urology;  Laterality: Bilateral;  . ESOPHAGEAL MANOMETRY Bilateral 07/19/2014   Procedure: ESOPHAGEAL MANOMETRY (EM);  Surgeon: Jerene Bears, MD;  [facility];  Service: Gastroenterology;   Laterality: Bilateral;  . FEMORAL ARTERY STENT     x6  . FINGER SURGERY    . FOOT SURGERY  04/25/2016    EXCISION BASE 5TH METATARSAL AND PARTIAL CUBOID LEFT FOOT  . HERNIA REPAIR     LEFT INGUINAL AND UMBILICAL REPAIRS  . HERNIA REPAIR    . I & D EXTREMITY Left 04/25/2016   Procedure: EXCISION BASE 5TH METATARSAL AND PARTIAL CUBOID LEFT FOOT;  Surgeon: Newt Minion, MD;  Location: [facility];  Service: Orthopedics;  Laterality: Left;  . ILEOSTOMY  01/13/2018   Procedure: ILEOSTOMY;  Surgeon: Clovis Riley, MD;  Location: [facility];  Service: General;;  . ILEOSTOMY CLOSURE N/A 05/21/2018   Procedure: ILEOSTOMY REVERSAL ERAS PATHWAY;  Surgeon: Clovis Riley, MD;  Location: [facility];  Service: General;  Laterality: N/A;  . IR RADIOLOGIST EVAL & MGMT  11/19/2017  . IR RADIOLOGIST EVAL & MGMT  12/03/2017  . IR RADIOLOGIST EVAL & MGMT  12/18/2017  . JOINT REPLACEMENT Right 2001   Total knee  . LAMINECTOMY     X 3 LUMBAR AND X 2 CERVICAL SPINE OPERATIONS  . LAPAROSCOPIC CHOLECYSTECTOMY W/ CHOLANGIOGRAPHY  11/09/04   SURGEON DR. Luella Cook  . LEFT HEART CATH AND CORONARY ANGIOGRAPHY N/A 09/16/2018   Procedure: LEFT HEART CATH AND CORONARY ANGIOGRAPHY;  Surgeon: Nigel Mormon, MD;  Location: [facility];  Service: Cardiovascular;  Laterality: N/A;  . LEFT HEART CATHETERIZATION WITH CORONARY ANGIOGRAM N/A 10/29/2014   Procedure: LEFT HEART CATHETERIZATION WITH CORONARY ANGIOGRAM;  Surgeon: Laverda Page, MD;  Location: [facility] C[facility] LAB;  Service: Cardiovascular;  Laterality: N/A;  . LOWER EXTREMITY ANGIOGRAM N/A 03/19/2012   Procedure: LOWER EXTREMITY ANGIOGRAM;  Surgeon: Burnell Blanks, MD;  Location: [facility] CATH LAB;  Service: Cardiovascular;  Laterality: N/A;  . NECK SURGERY    . PARTIAL COLECTOMY N/A 01/13/2018   Procedure: LAPAROSCOPIC ASSISTED   SIGMOID COLECTOMY ILEOSTOMY;  Surgeon: Clovis Riley, MD;  Location: [facility];  Service:  General;  Laterality: N/A;  . PENILE PROSTHESIS  IMPLANT  08/14/05   INFRAPUBIC INSERTION OF INFLATABLE PENILE PROSTHESIS; SURGEON DR. Amalia Hailey  . PENILE PROSTHESIS IMPLANT    . PERCUTANEOUS CORONARY STENT INTERVENTION (PCI-S) Right 10/29/2014   Procedure: PERCUTANEOUS CORONARY STENT INTERVENTION (PCI-S);  Surgeon: Laverda Page, MD;  Location: [facility] CATH LAB;  Service: Cardiovascular;  Laterality: Right;  . SHOULDER ARTHROSCOPY    . SPINE SURGERY    . TOE AMPUTATION Left   . TONSILLECTOMY    . TOTAL KNEE ARTHROPLASTY  07/2002   RIGHT KNEE ; SURGEON  DR. [name] ALSO HAD ARTHROSCOPIC RIGHT KNEE IN  10/2001  . TOTAL KNEE ARTHROPLASTY    . ULNAR NERVE TRANSPOSITION Right 10/21/2013   Procedure: RIGHT ELBOW  ULNAR NERVE DECOMPRESSION;  Surgeon: Wynonia Sours, MD;  Location: [facility];  Service: Orthopedics;  Laterality: Right;    FAMILY HISTORY Family History  Problem Relation Age of Onset  . Heart disease Father        Before age 48-  CAD, BPG  . Diabetes Father        Amputation  . Cancer Father        PROSTATE  . Hyperlipidemia Father   . Hypertension Father   . Heart attack Father        Triple BPG  . Varicose Veins Father   . Colon cancer Brother   . Diabetes Brother   . Heart disease Brother 28       A-Fib. Before age 65  . Hyperlipidemia Brother   . Hypertension Brother   . Cancer Sister        Breast  . Hyperlipidemia Sister   . Hypertension Sister   . Hypertension Son   . Arthritis Other        GRANDMOTHER  . Hypertension Other        OTHER FAMILY MEMBERS    SOCIAL HISTORY Social History   Tobacco Use  . Smoking status: Current Every Day Smoker    Packs/day: 2.00    Years: 35.00    Pack years: 70.00    Types: Cigarettes, E-cigarettes    Last attempt to quit: 10/28/2011    Years since quitting: 8.6  . Smokeless tobacco: Never Used  Vaping Use  . Vaping Use: Every day  . Substances: Nicotine  Substance Use Topics  . Alcohol use: Not Currently    Comment: "not in a long time"  . Drug use:  Not Currently         OPHTHALMIC EXAM:  Base Eye Exam    Visual Acuity (ETDRS)      Right Left   Dist [place] 20/60 +2 20/25 +1   Dist ph [place] 20/40 +1        Tonometry (Tonopen, 2:28 PM)      Right Left   Pressure 09 13       Pupils      Pupils Dark Light Shape React APD   Right PERRL 3 2 Round Brisk None   Left PERRL 3 2 Round Brisk None       Visual Fields (Counting fingers)      Left Right    Full Full       Extraocular Movement      Right Left    Full Full       Neuro/Psych    Oriented x3: Yes   Mood/Affect: Normal       Dilation    Right eye:  1.0% Mydriacyl, 2.5% Phenylephrine @ 2:40 PM        Slit Lamp and Fundus Exam    External Exam      Right Left   External Normal Normal       Slit Lamp Exam      Right Left   Lids/Lashes Normal Normal   Conjunctiva/Sclera White and quiet White and quiet   Cornea Clear Clear   Anterior Chamber Deep and quiet Deep and quiet   Iris Round and reactive Round and reactive   Lens Posterior chamber intraocular lens Posterior chamber intraocular lens   Anterior Vitreous Normal Normal       Fundus Exam      Right Left   Posterior Vitreous Normal    Disc Normal    C/D Ratio 0.4    Macula Exudates, Microaneurysms, Mild clinically significant macular edema    Vessels NPDR severe    Periphery Normal           IMAGING AND PROCEDURES  Imaging and Procedures for 06/27/20  OCT, Retina - OU - Both Eyes       Right Eye Scan locations included subfoveal. Central Foveal Thickness: 352. Progression has improved. Findings include cystoid macular edema, abnormal foveal contour.   Left Eye Quality was good. Scan locations included subfoveal. Central Foveal Thickness: 276. Progression has improved. Findings include cystoid macular edema.   Notes OU, worse OD currently at 9-week interval post intravitreal Eylea.  OS CSME on 8-week interval also vastly improved.        Intravitreal Injection, Pharmacologic Agent  - OD - Right Eye       Time Out 06/27/2020. 3:34 PM. Confirmed correct patient, procedure, site, and patient consented.   Anesthesia Topical anesthesia was used. Anesthetic medications included [place] 3.5%.   Procedure Preparation included 5% betadine to ocular surface, 10% betadine to eyelids. A 30 gauge needle was used.   Injection:  2 mg aflibercept Alfonse Flavors) SOLN   NDC: [id number], Lot: [id number]   Route: Intravitreal, Site: Right Eye, Waste: 0 mg  Post-op Post injection exam found visual acuity of at least counting fingers. The patient tolerated the procedure well. There were no complications. The patient received written and verbal post procedure care education. Post injection medications were not given.                 ASSESSMENT/PLAN:  Severe nonproliferative diabetic retinopathy of left eye, with macular edema, associated with type 2 diabetes mellitus (HCC) CSME left eye continues to remain stable.  Severe nonproliferative diabetic retinopathy of right eye, with macular edema, associated with type 2 diabetes mellitus ([place]) This to me OD did improve initially on intravitreal Eylea now worsening.  This suggests significant vegF burden from peripheral retinal nonperfusion.  We will thus repeat intravitreal Eylea today and deliver peripheral anterior PRP in the coming weeks      ICD-10-CM   1. Severe nonproliferative diabetic retinopathy of right eye, with macular edema, associated with type 2 diabetes mellitus (HCC)  E11.3411 OCT, Retina - OU - Both Eyes    Intravitreal Injection, Pharmacologic Agent - OD - Right Eye    aflibercept (EYLEA) SOLN 2 mg  2. Severe nonproliferative diabetic retinopathy of left eye, with macular edema, associated with type 2 diabetes mellitus ([place])  F02.7741     1.  OD, with CSME recurrent on Eylea, suggesting heavy vegF burden I intravitreal vitreal.  Will need repeat Eylea today and will plan PRP peripheral anterior in 3 weeks  2.   Follow-up as scheduled left eye for PRP for peripheral nonperfusion  3.  Ophthalmic Meds Ordered this visit:  Meds ordered this encounter  Medications  . aflibercept (EYLEA) SOLN 2 mg       Return in about 3 weeks (around 07/18/2020) for dilate, OD, PRP.  There are no Patient Instructions on file for this visit.   Explained the diagnoses, plan, and follow up with the patient and they expressed understanding.  Patient expressed understanding of the importance of proper follow up care.   Clent Demark Verdelle Valtierra M.D. Diseases & Surgery of the Retina and Vitreous Retina [facility] Diabetic [place] 06/27/20     Abbreviations: M myopia (nearsighted); A astigmatism; H hyperopia (farsighted); P presbyopia; Mrx spectacle prescription;  CTL contact lenses; OD right eye; OS left eye; OU both eyes  XT exotropia; ET esotropia; PEK punctate epithelial keratitis; PEE punctate epithelial erosions; DES dry eye syndrome; MGD meibomian gland dysfunction; ATs artificial tears; PFAT's preservative free artificial tears; [place] nuclear sclerotic cataract; PSC posterior subcapsular cataract; ERM epi-retinal membrane; PVD posterior vitreous detachment; RD retinal detachment; DM diabetes mellitus; DR diabetic retinopathy; NPDR non-proliferative diabetic retinopathy; PDR proliferative diabetic retinopathy; CSME clinically significant macular edema; DME diabetic macular edema; dbh dot blot hemorrhages; CWS cotton wool spot; POAG primary open angle glaucoma; C/D cup-to-disc ratio; HVF humphrey visual field; GVF goldmann visual field; OCT optical coherence tomography; IOP intraocular pressure; BRVO Branch retinal vein occlusion; CRVO central retinal vein occlusion; CRAO central retinal artery occlusion; BRAO branch retinal artery occlusion; RT retinal tear; SB scleral buckle; PPV pars plana vitrectomy; VH Vitreous hemorrhage; PRP panretinal laser photocoagulation; IVK intravitreal kenalog; VMT vitreomacular traction; MH Macular hole;   NVD neovascularization of the disc; NVE neovascularization elsewhere; AREDS age related eye disease study; ARMD age related macular degeneration; POAG primary open angle glaucoma; EBMD epithelial/anterior basement membrane dystrophy; ACIOL anterior chamber intraocular lens; IOL intraocular lens; PCIOL posterior chamber intraocular lens; Phaco/IOL phacoemulsification with intraocular lens placement; [place] photorefractive keratectomy; LASIK laser assisted in situ keratomileusis; HTN hypertension; DM diabetes mellitus; COPD chronic obstructive pulmonary disease

## 2020-06-27 NOTE — Assessment & Plan Note (Signed)
This to me OD did improve initially on intravitreal Eylea now worsening.  This suggests significant vegF burden from peripheral retinal nonperfusion.  We will thus repeat intravitreal Eylea today and deliver peripheral anterior PRP in the coming weeks

## 2020-06-27 NOTE — Assessment & Plan Note (Signed)
CSME left eye continues to remain stable.

## 2020-07-01 ENCOUNTER — Other Ambulatory Visit (HOSPITAL_COMMUNITY): Payer: Self-pay | Admitting: *Deleted

## 2020-07-04 ENCOUNTER — Other Ambulatory Visit: Payer: Self-pay

## 2020-07-04 ENCOUNTER — Encounter (HOSPITAL_COMMUNITY)
Admission: RE | Admit: 2020-07-04 | Discharge: 2020-07-04 | Disposition: A | Discharge status: Home / Self Care | Payer: 59 | Source: Ambulatory Visit | Attending: Nephrology | Admitting: Nephrology

## 2020-07-04 DIAGNOSIS — N189 Chronic kidney disease, unspecified: Secondary | ICD-10-CM | POA: Diagnosis not present

## 2020-07-04 LAB — FERRITIN: Ferritin: 422 ng/mL — ABNORMAL HIGH (ref 24–336)

## 2020-07-04 LAB — IRON AND TIBC
Iron: 56 ug/dL (ref 45–182)
Saturation Ratios: 26 % (ref 17.9–39.5)
TIBC: 218 ug/dL — ABNORMAL LOW (ref 250–450)
UIBC: 162 ug/dL

## 2020-07-04 LAB — POCT HEMOGLOBIN-HEMACUE: Hemoglobin: 11.3 g/dL — ABNORMAL LOW (ref 13.0–17.0)

## 2020-07-04 MED ORDER — EPOETIN ALFA-EPBX 10000 UNIT/ML IJ SOLN
INTRAMUSCULAR | Status: AC
  Filled 2020-07-04: qty 2

## 2020-07-04 MED ORDER — EPOETIN ALFA-EPBX 10000 UNIT/ML IJ SOLN
20000.0000 [IU] | INTRAMUSCULAR | Status: DC
  Administered 2020-07-04: 20000 [IU] via SUBCUTANEOUS

## 2020-07-08 ENCOUNTER — Encounter: Payer: Self-pay | Admitting: Physician Assistant

## 2020-07-08 ENCOUNTER — Inpatient Hospital Stay (HOSPITAL_COMMUNITY): Admission: RE | Admit: 2020-07-08 | Payer: 59 | Source: Ambulatory Visit

## 2020-07-08 ENCOUNTER — Other Ambulatory Visit: Payer: Self-pay

## 2020-07-08 ENCOUNTER — Ambulatory Visit (HOSPITAL_COMMUNITY)
Admission: RE | Admit: 2020-07-08 | Discharge: 2020-07-08 | Disposition: A | Discharge status: Home / Self Care | Payer: 59 | Source: Ambulatory Visit | Attending: Vascular Surgery | Admitting: Vascular Surgery

## 2020-07-08 ENCOUNTER — Ambulatory Visit (INDEPENDENT_AMBULATORY_CARE_PROVIDER_SITE_OTHER): Payer: Self-pay | Admitting: Physician Assistant

## 2020-07-08 VITALS — BP 145/65 | HR 61 | Temp 98.7°F | Resp 18 | Ht 74.0 in | Wt 280.0 lb

## 2020-07-08 DIAGNOSIS — N186 End stage renal disease: Secondary | ICD-10-CM | POA: Diagnosis present

## 2020-07-08 NOTE — Progress Notes (Signed)
    Postoperative Access Visit   History of Present Illness   [name] is a 64 y.o. year old male who presents for postoperative follow-up for left arm brachiocephalic AV fistula by Dr. Donzetta Matters on 06/02/20. The patient's wounds are well healed.  The patient notes no steal symptoms.  He currently is not on Hemodialysis.  He does have history of peripheral arterial disease. He is s/p RLE below knee amputation, has had several left toe amputations and has stents in LLE. He is not complaining of any lower extremity claudication, rest pain or non healing wounds. He does say he has overall lower extremity weakness. He does ambulate with prosthesis but not as much as before due to his weakness. He also has history of left CEA by Dr. Kellie Simmering 11/10/10. He reports no amaurosis fugax, slurred speech, facial drooping, weakness or numbness of upper or lower extremities  Physical Examination   Vitals:   07/08/20 1543  BP: (!) 145/65  Pulse: 61  Resp: 18  Temp: 98.7 F (37.1 C)  TempSrc: Temporal  SpO2: 98%  Weight: 280 lb (127 kg)  Height: 6\' 2"  (1.88 m)   Body mass index is 35.95 kg/m.  General Obese, pleasant male, not in any distress Cardiac Regular rate and rhythm; no carotid bruits Lungs  Non labored left arm Incision is well healed, he does have small seroma around AC incision. No erythema. Non tender. 2+ radial pulse, hand grip is 5/5, sensation in digits is intact,palpable thrill, bruit can be auscultated. Fistula is deep in the left upper arm    Medical Decision Making    [name] is a 64 y.o. year old male who presents s/p left arm brachiocephalic AV fistula by Dr. Donzetta Matters on 06/02/20. His incision is well healed. His fistula has a good volume flow, is of adequate diameter but despite duplex clinically it is too deep in the left upper arm for access. The duplex also shows several competing branches. He is without any steal symptoms. I have recommended that he has a 2nd stage  transposition with ligation of competing branches  He has no new neurological deficits s/p Left CEA  He remains asymptomatic regarding his peripheral arterial disease  He is overdue for his annual carotid duplex and bilateral ABIs so I have scheduled him to follow up post operatively with these  He is currently not on Hemodialysis  I have scheduled him for a left 2nd stage BV transposition with branch ligation with Dr. Jeri Lager, PA-C [facility] Office: [phone]  Clinic MD: Dr. Stanford Breed

## 2020-07-08 NOTE — H&P (View-Only) (Signed)
    Postoperative Access Visit   History of Present Illness   RHYS LICHTY is a 64 y.o. year old male who presents for postoperative follow-up for left arm brachiocephalic AV fistula by Dr. Donzetta Matters on 06/02/20. The patient's wounds are well healed.  The patient notes no steal symptoms.  He currently is not on Hemodialysis.  He does have history of peripheral arterial disease. He is s/p RLE below knee amputation, has had several left toe amputations and has stents in LLE. He is not complaining of any lower extremity claudication, rest pain or non healing wounds. He does say he has overall lower extremity weakness. He does ambulate with prosthesis but not as much as before due to his weakness. He also has history of left CEA by Dr. Kellie Simmering 11/10/10. He reports no amaurosis fugax, slurred speech, facial drooping, weakness or numbness of upper or lower extremities  Physical Examination   Vitals:   07/08/20 1543  BP: (!) 145/65  Pulse: 61  Resp: 18  Temp: 98.7 F (37.1 C)  TempSrc: Temporal  SpO2: 98%  Weight: 280 lb (127 kg)  Height: 6\' 2"  (1.88 m)   Body mass index is 35.95 kg/m.  General Obese, pleasant male, not in any distress Cardiac Regular rate and rhythm; no carotid bruits Lungs  Non labored left arm Incision is well healed, he does have small seroma around AC incision. No erythema. Non tender. 2+ radial pulse, hand grip is 5/5, sensation in digits is intact,palpable thrill, bruit can be auscultated. Fistula is deep in the left upper arm    Medical Decision Making    AMON COSTILLA is a 64 y.o. year old male who presents s/p left arm brachiocephalic AV fistula by Dr. Donzetta Matters on 06/02/20. His incision is well healed. His fistula has a good volume flow, is of adequate diameter but despite duplex clinically it is too deep in the left upper arm for access. The duplex also shows several competing branches. He is without any steal symptoms. I have recommended that he has a 2nd stage  transposition with ligation of competing branches  He has no new neurological deficits s/p Left CEA  He remains asymptomatic regarding his peripheral arterial disease  He is overdue for his annual carotid duplex and bilateral ABIs so I have scheduled him to follow up post operatively with these  He is currently not on Hemodialysis  I have scheduled him for a left 2nd stage BV transposition with branch ligation with Dr. Jeri Lager, PA-C [facility] Office: [phone]  Clinic MD: Dr. Stanford Breed

## 2020-07-11 ENCOUNTER — Encounter (INDEPENDENT_AMBULATORY_CARE_PROVIDER_SITE_OTHER): Payer: Self-pay | Admitting: Ophthalmology

## 2020-07-11 ENCOUNTER — Other Ambulatory Visit: Payer: Self-pay

## 2020-07-11 ENCOUNTER — Ambulatory Visit (INDEPENDENT_AMBULATORY_CARE_PROVIDER_SITE_OTHER): Payer: 59 | Admitting: Ophthalmology

## 2020-07-11 DIAGNOSIS — E113412 Type 2 diabetes mellitus with severe nonproliferative diabetic retinopathy with macular edema, left eye: Secondary | ICD-10-CM | POA: Diagnosis not present

## 2020-07-11 NOTE — Assessment & Plan Note (Signed)
CSME OS now well controlled on antivegF, plan is to deliver PRP to decrease vegF load

## 2020-07-11 NOTE — Progress Notes (Signed)
07/11/2020     CHIEF COMPLAINT Patient presents for Retina Follow Up   HISTORY OF PRESENT ILLNESS: [name] is a 64 y.o. male who presents to the clinic today for:   HPI    Retina Follow Up    Patient presents with  Diabetic Retinopathy.  In left eye.  Severity is severe.  Duration of 4 weeks.  Since onset it is stable.  I, the attending physician,  performed the HPI with the patient and updated documentation appropriately.          Comments    4 Week NPDR f\u OS. PRP OS  Pt states no changes in vision       Last edited by Tilda Franco on 07/11/2020  2:35 PM. (History)      Referring physician: [name], PA-C [place],  [place] [id number]  HISTORICAL INFORMATION:   Selected notes from the MEDICAL RECORD NUMBER    Lab Results  Component Value Date   HGBA1C 5.7 (H) 09/16/2018     CURRENT MEDICATIONS: Current Outpatient Medications (Ophthalmic Drugs)  Medication Sig  . Polyvinyl Alcohol-Povidone PF (REFRESH) 1.4-0.6 % SOLN Place 1 drop into both eyes 2 (two) times daily as needed (dry eyes).    No current facility-administered medications for this visit. (Ophthalmic Drugs)   Current Outpatient Medications (Other)  Medication Sig  . amLODipine (NORVASC) 10 MG tablet Take 1 tablet (10 mg total) by mouth daily.  Marland Kitchen amoxicillin (AMOXIL) 250 MG chewable tablet Chew 250 mg by mouth 2 (two) times daily.  Marland Kitchen aspirin EC 81 MG tablet Take 81 mg by mouth daily.  Marland Kitchen atorvastatin (LIPITOR) 40 MG tablet Take 40 mg by mouth at bedtime.   Marland Kitchen BIDIL 20-37.5 MG tablet Take 1 tablet by mouth 3 (three) times daily.  . carvedilol (COREG) 25 MG tablet Take 1.5 tablets (37.5 mg total) by mouth 2 (two) times daily.  . clobetasol (TEMOVATE) 0.05 % external solution Apply 1 application topically daily as needed (dry scalp).  . clopidogrel (PLAVIX) 75 MG tablet Take 75 mg by mouth daily.   . fentaNYL (DURAGESIC - DOSED MCG/HR) 50 MCG/HR Place 50 mcg onto the skin  every 3 (three) days.  . ferrous gluconate (FERGON) 324 MG tablet Take 324 mg by mouth 2 (two) times daily.  . fluticasone (CUTIVATE) 0.05 % cream Apply 1 application topically 2 (two) times daily as needed (dry skin on face).   . folic acid (FOLVITE) 1 MG tablet Take 1 mg by mouth daily.  . Glucosamine HCl (GLUCOSAMINE PO) Take 1 tablet by mouth 2 (two) times daily.   Marland Kitchen HUMALOG KWIKPEN 100 UNIT/ML KiwkPen Inject 15-16 Units into the skin See admin instructions. Inject 13 units before breakfast, lunch, & dinner--  . insulin glargine (LANTUS) 100 UNIT/ML injection Inject 55 Units into the skin at bedtime.  Marland Kitchen ketoconazole (NIZORAL) 2 % cream Apply 1 application topically 2 (two) times daily as needed for irritation.   Marland Kitchen linaclotide (LINZESS) 145 MCG CAPS capsule Take 145 mcg by mouth daily as needed (constipation).   Marland Kitchen loratadine (CLARITIN) 10 MG tablet Take 10 mg by mouth daily.  . Multiple Vitamin (MULTIVITAMIN) tablet Take 1 tablet by mouth daily.  Marland Kitchen NEEDLE, REUSABLE, 22 G 22G X 1-1/2" MISC 1 Units by Does not apply route as directed. For B12 IM inj  . omega-3 acid ethyl esters (LOVAZA) 1 g capsule Take 2 g by mouth 2 (two) times daily.  . ondansetron (  ZOFRAN) 4 MG tablet Take 4 mg by mouth every 6 (six) hours as needed for nausea or vomiting.  Marland Kitchen oxyCODONE-acetaminophen (PERCOCET) 7.5-325 MG tablet Take 1 tablet by mouth every 8 (eight) hours.  . pantoprazole (PROTONIX) 40 MG tablet Take 40 mg by mouth daily.  . pregabalin (LYRICA) 100 MG capsule Take 1 capsule (100 mg total) by mouth 3 (three) times daily.  Marland Kitchen rOPINIRole (REQUIP) 2 MG tablet Take 2 mg by mouth at bedtime.   . Syringe/Needle, Disp, (SYRINGE 3CC/22GX1-1/2") 22G X 1-1/2" 3 ML MISC 1 Syringe by Does not apply route as directed. For b12 IM inj  . tamsulosin (FLOMAX) 0.4 MG CAPS capsule Take 0.4 mg by mouth daily.  Marland Kitchen torsemide (DEMADEX) 20 MG tablet Take 20 mg by mouth See admin instructions. Take 20 mg daily, may take a second 20 mg  dose before 1500 as needed for swelling   No current facility-administered medications for this visit. (Other)      REVIEW OF SYSTEMS:    ALLERGIES Allergies  Allergen Reactions  . Contrast Media [Iodinated Diagnostic Agents]     Difficulty breathing    . Ivp Dye [Iodinated Diagnostic Agents] Anaphylaxis and Other (See Comments)    Breathing problems, altered mental state   . Adhesive [Tape] Rash    Rash after 3-4 days use    PAST MEDICAL HISTORY Past Medical History:  Diagnosis Date  . Carotid artery occlusion 11/10/10   LEFT CAROTID ENDARTERECTOMY  . Complication of anesthesia    BP WENT UP AT [facility] "  . COPD (chronic obstructive pulmonary disease) ([place])    pt denies this dx as of 06/01/20 - no inhaler   . Diverticulitis   . Diverticulosis of colon (without mention of hemorrhage)   . DJD (degenerative joint disease)    knees/hands/feet/back/neck  . Fatty liver   . Full dentures   . GERD (gastroesophageal reflux disease)   . H/O hiatal hernia   . History of blood transfusion    with a past surical procedure per patient 06/01/20  . Hyperlipidemia   . Hypertension   . Neuromuscular disorder ([place])    peripheral neuropathy  . Non-pressure chronic ulcer of other part of left foot limited to breakdown of skin ([place]) 11/12/2016  . Osteomyelitis ([place])    left 5th metatarsal  . PAD (peripheral artery disease) ([place])    Distal aortogram June 2012. Atherectomy left popliteal artery July 2012.   . Pseudoclaudication 11/15/2018  . Sleep apnea    pt denies this dx as of 06/01/20  . Slurred speech    AS PER WIFE IN D/C NOTE 11/10/10  . 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 Unstable angina ([place]) 09/16/2018  . Wears glasses    Past Surgical History:  Procedure Laterality Date  . AMPUTATION  11/05/2011   Procedure: AMPUTATION RAY;  Surgeon: Wylene Simmer, MD;  Location: [place];  Service: Orthopedics;  Laterality: Right;  Amputation of Right 4&5th Toes  . AMPUTATION Left 11/26/2012   Procedure:  AMPUTATION RAY;  Surgeon: Wylene Simmer, MD;  Location: [facility];  Service: Orthopedics;  Laterality: Left;  fourth ray amputation  . AMPUTATION Right 08/27/2014   Procedure: Transmetatarsal Amputation;  Surgeon: Newt Minion, MD;  Location: [facility];  Service: Orthopedics;  Laterality: Right;  . AMPUTATION Right 01/14/2015   Procedure: AMPUTATION BELOW KNEE;  Surgeon: Newt Minion, MD;  Location: [facility];  Service: Orthopedics;  Laterality: Right;  . AMPUTATION Left 10/21/2015   Procedure: Left Foot  5th Ray Amputation;  Surgeon: Newt Minion, MD;  Location: [facility];  Service: Orthopedics;  Laterality: Left;  . ANTERIOR FUSION CERVICAL SPINE  02/06/06   C4-5, C5-6, C6-7; SURGEON DR. MAX COHEN  . AV FISTULA PLACEMENT Left 06/02/2020   Procedure: ARTERIOVENOUS (AV) FISTULA CREATION LEFT;  Surgeon: Waynetta Sandy, MD;  Location: [facility];  Service: Vascular;  Laterality: Left;  . BACK SURGERY     x 3  . BELOW KNEE LEG AMPUTATION Right   . CARDIAC CATHETERIZATION  10/31/04   2009  . CAROTID ENDARTERECTOMY  11/10/10  . CAROTID ENDARTERECTOMY Left 11/10/2010   Subtotal occlusion of left internal carotid artery with left hemispheric transient ischemic attacks.  . CAROTID STENT    . CARPAL TUNNEL RELEASE Right 10/21/2013   Procedure: RIGHT CARPAL TUNNEL RELEASE;  Surgeon: Wynonia Sours, MD;  Location: [facility];  Service: Orthopedics;  Laterality: Right;  . CHOLECYSTECTOMY    . COLON SURGERY    . COLONOSCOPY    . COLOSTOMY REVERSAL  05/21/2018   ileostomy reversal  . CYSTOSCOPY WITH STENT PLACEMENT Bilateral 01/13/2018   Procedure: CYSTOSCOPY WITH BILATERAL URETERAL CATHETER PLACEMENT;  Surgeon: Ardis Hughs, MD;  Location: [facility];  Service: Urology;  Laterality: Bilateral;  . ESOPHAGEAL MANOMETRY Bilateral 07/19/2014   Procedure: ESOPHAGEAL MANOMETRY (EM);  Surgeon: Jerene Bears, MD;  [facility];  Service: Gastroenterology;  Laterality: Bilateral;  . FEMORAL ARTERY STENT       x6  . FINGER SURGERY    . FOOT SURGERY  04/25/2016    EXCISION BASE 5TH METATARSAL AND PARTIAL CUBOID LEFT FOOT  . HERNIA REPAIR     LEFT INGUINAL AND UMBILICAL REPAIRS  . HERNIA REPAIR    . I & D EXTREMITY Left 04/25/2016   Procedure: EXCISION BASE 5TH METATARSAL AND PARTIAL CUBOID LEFT FOOT;  Surgeon: Newt Minion, MD;  Location: [facility];  Service: Orthopedics;  Laterality: Left;  . ILEOSTOMY  01/13/2018   Procedure: ILEOSTOMY;  Surgeon: Clovis Riley, MD;  Location: [facility];  Service: General;;  . ILEOSTOMY CLOSURE N/A 05/21/2018   Procedure: ILEOSTOMY REVERSAL ERAS PATHWAY;  Surgeon: Clovis Riley, MD;  Location: [facility];  Service: General;  Laterality: N/A;  . IR RADIOLOGIST EVAL & MGMT  11/19/2017  . IR RADIOLOGIST EVAL & MGMT  12/03/2017  . IR RADIOLOGIST EVAL & MGMT  12/18/2017  . JOINT REPLACEMENT Right 2001   Total knee  . LAMINECTOMY     X 3 LUMBAR AND X 2 CERVICAL SPINE OPERATIONS  . LAPAROSCOPIC CHOLECYSTECTOMY W/ CHOLANGIOGRAPHY  11/09/04   SURGEON DR. Luella Cook  . LEFT HEART CATH AND CORONARY ANGIOGRAPHY N/A 09/16/2018   Procedure: LEFT HEART CATH AND CORONARY ANGIOGRAPHY;  Surgeon: Nigel Mormon, MD;  Location: [facility];  Service: Cardiovascular;  Laterality: N/A;  . LEFT HEART CATHETERIZATION WITH CORONARY ANGIOGRAM N/A 10/29/2014   Procedure: LEFT HEART CATHETERIZATION WITH CORONARY ANGIOGRAM;  Surgeon: Laverda Page, MD;  Location: [facility] CATH [facility];  Service: Cardiovascular;  Laterality: N/A;  . LOWER EXTREMITY ANGIOGRAM N/A 03/19/2012   Procedure: LOWER EXTREMITY ANGIOGRAM;  Surgeon: Burnell Blanks, MD;  Location: [facility] C[facility];  Service: Cardiovascular;  Laterality: N/A;  . NECK SURGERY    . PARTIAL COLECTOMY N/A 01/13/2018   Procedure: LAPAROSCOPIC ASSISTED   SIGMOID COLECTOMY ILEOSTOMY;  Surgeon: Clovis Riley, MD;  Location[facility];  Service: General;  Laterality: N/A;  . PENILE  PROSTHESIS IMPLANT  08/14/05   INFRAPUBIC INSERTION OF  INFLATABLE PENILE PROSTHESIS; SURGEON DR. Amalia Hailey  . PENILE PROSTHESIS IMPLANT    . PERCUTANEOUS CORONARY STENT INTERVENTION (PCI-S) Right 10/29/2014   Procedure: PERCUTANEOUS CORONARY STENT INTERVENTION (PCI-S);  Surgeon: Laverda Page, MD;  Location: [facility] CATH LAB;  Service: Cardiovascular;  Laterality: Right;  . SHOULDER ARTHROSCOPY    . SPINE SURGERY    . TOE AMPUTATION Left   . TONSILLECTOMY    . TOTAL KNEE ARTHROPLASTY  07/2002   RIGHT KNEE ; SURGEON  DR. [name] ALSO HAD ARTHROSCOPIC RIGHT KNEE IN  10/2001  . TOTAL KNEE ARTHROPLASTY    . ULNAR NERVE TRANSPOSITION Right 10/21/2013   Procedure: RIGHT ELBOW  ULNAR NERVE DECOMPRESSION;  Surgeon: Wynonia Sours, MD;  Location: [facility];  Service: Orthopedics;  Laterality: Right;    FAMILY HISTORY Family History  Problem Relation Age of Onset  . Heart disease Father        Before age 62-  CAD, BPG  . Diabetes Father        Amputation  . Cancer Father        PROSTATE  . Hyperlipidemia Father   . Hypertension Father   . Heart attack Father        Triple BPG  . Varicose Veins Father   . Colon cancer Brother   . Diabetes Brother   . Heart disease Brother 7       A-Fib. Before age 53  . Hyperlipidemia Brother   . Hypertension Brother   . Cancer Sister        Breast  . Hyperlipidemia Sister   . Hypertension Sister   . Hypertension Son   . Arthritis Other        GRANDMOTHER  . Hypertension Other        OTHER FAMILY MEMBERS    SOCIAL HISTORY Social History   Tobacco Use  . Smoking status: Current Every Day Smoker    Packs/day: 2.00    Years: 35.00    Pack years: 70.00    Types: Cigarettes, E-cigarettes    Last attempt to quit: 10/28/2011    Years since quitting: 8.7  . Smokeless tobacco: Never Used  Vaping Use  . Vaping Use: Every day  . Substances: Nicotine  Substance Use Topics  . Alcohol use: Not Currently    Comment: "not in a long time"  . Drug use: Not Currently         OPHTHALMIC  EXAM: Base Eye Exam    Visual Acuity (Snellen - Linear)      Right Left   Dist [place] 20/60 + 20/25   Dist ph [place] 20/30 -1        Tonometry (Tonopen, 2:40 PM)      Right Left   Pressure 10 10       Neuro/Psych    Oriented x3: Yes   Mood/Affect: Normal       Dilation    Left eye: 1.0% Mydriacyl, 2.5% Phenylephrine @ 2:40 PM        Slit Lamp and Fundus Exam    External Exam      Right Left   External Normal Normal       Slit Lamp Exam      Right Left   Lids/Lashes Normal Normal   Conjunctiva/Sclera White and quiet White and quiet   Cornea Clear Clear   Anterior Chamber Deep and quiet Deep and quiet   Iris Round  and reactive Round and reactive   Lens Posterior chamber intraocular lens Posterior chamber intraocular lens   Anterior Vitreous Normal Normal       Fundus Exam      Right Left   Posterior Vitreous  Posterior vitreous detachment   Disc  Normal   C/D Ratio  0.25   Macula  Clinically significant macular edema, Exudates, Microaneurysms   Vessels  NPDR-Severe   Periphery  Normal          IMAGING AND PROCEDURES  Imaging and Procedures for 07/11/20  Panretinal Photocoagulation - OS - Left Eye       Time Out Confirmed correct patient, procedure, site, and patient consented.   Anesthesia Topical anesthesia was used. Anesthetic medications included Proparacaine 0.5%.   Laser Information The type of laser was diode. Color was yellow. The duration in seconds was 0.02. The spot size was 390 microns. Laser power was 380. Total spots was 796.   Post-op The patient tolerated the procedure well. There were no complications. The patient received written and verbal post procedure care education.   Notes P.o. applied nasally, inferiorly with preplanned s,&\spelled superiorly and somewhat temporally with grid                ASSESSMENT/PLAN:  Severe nonproliferative diabetic retinopathy of left eye, with macular edema, associated with type 2 diabetes  mellitus ([place]) CSME OS now well controlled on antivegF, plan is to deliver PRP to decrease vegF load       ICD-10-CM   1. Severe nonproliferative diabetic retinopathy of left eye, with macular edema, associated with type 2 diabetes mellitus (HCC)  V61.6073 Panretinal Photocoagulation - OS - Left Eye    1.  NAVILAS guided laser therapy, PRP inferiorly and nasally delivered today to decrease vegF load OS  2.  Dilate OD next as scheduled for PRP  3.  Ophthalmic Meds Ordered this visit:  No orders of the defined types were placed in this encounter.      Return for As scheduled, OD, dilate, PRP.  There are no Patient Instructions on file for this visit.   Explained the diagnoses, plan, and follow up with the patient and they expressed understanding.  Patient expressed understanding of the importance of proper follow up care.   Clent Demark Kieren Ricci M.D. Diseases & Surgery of the Retina and Vitreous Retina & Diabetic [place] 07/11/20     Abbreviations: M myopia (nearsighted); A astigmatism; H hyperopia (farsighted); P presbyopia; Mrx spectacle prescription;  CTL contact lenses; OD right eye; OS left eye; OU both eyes  XT exotropia; ET esotropia; PEK punctate epithelial keratitis; PEE punctate epithelial erosions; DES dry eye syndrome; MGD meibomian gland dysfunction; ATs artificial tears; PFAT's preservative free artificial tears; [place] nuclear sclerotic cataract; PSC posterior subcapsular cataract; ERM epi-retinal membrane; PVD posterior vitreous detachment; RD retinal detachment; DM diabetes mellitus; DR diabetic retinopathy; NPDR non-proliferative diabetic retinopathy; PDR proliferative diabetic retinopathy; CSME clinically significant macular edema; DME diabetic macular edema; dbh dot blot hemorrhages; CWS cotton wool spot; POAG primary open angle glaucoma; C/D cup-to-disc ratio; HVF humphrey visual field; GVF goldmann visual field; OCT optical coherence tomography; IOP intraocular  pressure; BRVO Branch retinal vein occlusion; CRVO central retinal vein occlusion; CRAO central retinal artery occlusion; BRAO branch retinal artery occlusion; RT retinal tear; SB scleral buckle; PPV pars plana vitrectomy; VH Vitreous hemorrhage; PRP panretinal laser photocoagulation; IVK intravitreal kenalog; VMT vitreomacular traction; MH Macular hole;  NVD neovascularization of the disc; NVE neovascularization elsewhere; AREDS age  related eye disease study; ARMD age related macular degeneration; POAG primary open angle glaucoma; EBMD epithelial/anterior basement membrane dystrophy; ACIOL anterior chamber intraocular lens; IOL intraocular lens; PCIOL posterior chamber intraocular lens; Phaco/IOL phacoemulsification with intraocular lens placement; [place] photorefractive keratectomy; LASIK laser assisted in situ keratomileusis; HTN hypertension; DM diabetes mellitus; COPD chronic obstructive pulmonary disease

## 2020-07-13 ENCOUNTER — Encounter: Payer: Self-pay | Admitting: Physician Assistant

## 2020-07-13 ENCOUNTER — Ambulatory Visit (INDEPENDENT_AMBULATORY_CARE_PROVIDER_SITE_OTHER): Payer: 59 | Admitting: Physician Assistant

## 2020-07-13 VITALS — Ht 74.0 in | Wt 280.0 lb

## 2020-07-13 DIAGNOSIS — L97221 Non-pressure chronic ulcer of left calf limited to breakdown of skin: Secondary | ICD-10-CM

## 2020-07-13 MED ORDER — DOXYCYCLINE HYCLATE 100 MG PO TABS: 100.0000 mg | ORAL_TABLET | Freq: Two times a day (BID) | ORAL | 0 refills | Status: DC

## 2020-07-13 NOTE — Progress Notes (Signed)
Office Visit Note   Patient: [name]           Date of Birth: 06-02-1956           MRN: [id number] Visit Date: 07/13/2020              Requested by: [name], PA-C [id number] [place],  [place] [id number] PCP: [name], PA-C  Chief Complaint  Patient presents with  . Left Leg - Pain      HPI: This is a pleasant 64 year old gentleman who has status post right below-knee amputation.  He comes in today for a pressure wound to his left medial leg.  He has a orthotic AFO with a upright brace.  He has been seen in the past for pressure from the metal bar causing skin breakdown on the medial side of his left leg.  He comes in today with the same.  Of note he is in the process of beginning dialysis.  Assessment & Plan: Visit Diagnoses: No diagnosis found.  Plan: Patient has significant swelling.  He will need to be wrapped with Profore and should not use his AFO until his skin recovers and swelling has decreased.  At that point he could once again be fitted for a new upright brace.  Unfortunately he states he drove himself here today and cannot ambulate without his upright brace.  We did place a silver cell dressing and a wrap over the area but he is agreed to get a ride tomorrow and begin Profore dressing changes.  He has requested to see [name] at that visit.  I have started on doxycycline.  Patient does have an Transport planner which she is agreed to begin using.  He will remove his brace when he gets home  Follow-Up Instructions: No follow-ups on file.   Ortho Exam  Patient is alert, oriented, no adenopathy, well-dressed, normal affect, normal respiratory effort. Focused examination demonstrates significant soft tissue swelling in the left lower extremity.  On the medial side of the calf proximately he has a pressure wound that probes into subcu fat but does not probe deeply.  He has surrounding erythema that coordinates with the strap of his brace.  Distally he has  venous insufficiency.  There is no foul odor or no ascending cellulitis.  Imaging: No results found. No images are attached to the encounter.  Labs: Lab Results  Component Value Date   HGBA1C 5.7 (H) 09/16/2018   HGBA1C 7.7 (H) 05/15/2018   HGBA1C 7.1 (H) 01/07/2018   ESRSEDRATE 35 (H) 09/03/2016   ESRSEDRATE 111 (H) 05/11/2016   ESRSEDRATE 98 (H) 05/04/2016   CRP 1.2 (H) 09/03/2016   CRP 4.7 (H) 05/11/2016   CRP 8.0 (H) 05/04/2016   REPTSTATUS 11/15/2017 FINAL 11/10/2017   GRAMSTAIN  11/04/2017    ABUNDANT WBC PRESENT, PREDOMINANTLY PMN MODERATE GRAM NEGATIVE RODS MODERATE GRAM POSITIVE COCCI Performed at [facility], [place] [address], [place], [place] [address]    CULT  11/10/2017    NO GROWTH 5 DAYS Performed at [facility] [address] [place], [place] [address]    LABORGA STAPHYLOCOCCUS AUREUS 09/26/2014     Lab Results  Component Value Date   ALBUMIN 3.7 (L) 05/30/2020   ALBUMIN 2.9 (L) 06/19/2019   ALBUMIN 3.5 05/15/2018   PREALBUMIN 19.0 05/04/2016   PREALBUMIN 25.0 12/16/2015    Lab Results  Component Value Date   MG 1.9 05/24/2018   MG 2.1 05/23/2018  MG 1.3 (L) 05/22/2018   No results found for: VD25OH  Lab Results  Component Value Date   PREALBUMIN 19.0 05/04/2016   PREALBUMIN 25.0 12/16/2015   CBC EXTENDED Latest Ref Rng & Units 07/04/2020 06/20/2020 06/06/2020  WBC 3.4 - 10.8 x10E3/uL - - -  RBC 4.14 - 5.80 x10E6/uL - - -  HGB 13.0 - 17.0 g/dL 11.3(L) 11.6(L) 11.7(L)  HCT 39 - 52 % - - -  PLT 150 - 450 x10E3/uL - - -  NEUTROABS 1.7 - 7.7 K/uL - - -  LYMPHSABS 0.7 - 4.0 K/uL - - -     Body mass index is 35.95 kg/m.  Orders:  No orders of the defined types were placed in this encounter.  Meds ordered this encounter  Medications  . doxycycline (VIBRA-TABS) 100 MG tablet    Sig: Take 1 tablet (100 mg total) by mouth 2 (two) times daily.    Dispense:  60 tablet    Refill:  0     Procedures: No procedures  performed  Clinical Data: No additional findings.  ROS:  All other systems negative, except as noted in the HPI. Review of Systems  Objective: Vital Signs: Ht 6\' 2"  (1.88 m)   Wt 280 lb (127 kg)   BMI 35.95 kg/m   Specialty Comments:  No specialty comments available.  PMFS History: Patient Active Problem List   Diagnosis Date Noted  . Carotid stenosis 06/15/2020  . Dyslipidemia 06/15/2020  . Severe nonproliferative diabetic retinopathy of right eye, with macular edema, associated with type 2 diabetes mellitus ([place]) 01/18/2020  . Severe nonproliferative diabetic retinopathy of left eye, with macular edema, associated with type 2 diabetes mellitus ([place]) 01/04/2020  . Retinal hemorrhage of right eye 01/04/2020  . 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 Pseudoclaudication 11/15/2018  . Essential hypertension 09/18/2018  . S/P BKA (below knee amputation), right ([place]) 09/18/2018  . Amputation of toe of left foot ([place]) 09/18/2018  . Peripheral artery disease ([place]) 09/18/2018  . S/P carotid endarterectomy 09/18/2018  . OSA on CPAP 09/18/2018  . Chronic back pain 09/18/2018  . Status post reversal of ileostomy 05/21/2018  . Normocytic anemia 02/14/2018  . Intra-abdominal abscess ([place]) 11/04/2017  . Chronic venous hypertension (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 ([place]) 12/11/2016  . Unilateral primary osteoarthritis, left knee 10/11/2016  . History of right below knee amputation ([place]) 07/12/2016  . Diabetic foot infection ([place]) 05/04/2016  . Type 2 diabetes mellitus with insulin therapy ([place]) 12/16/2015  . Amputated toe ([place]) 10/21/2015  . Leg edema, left 09/03/2015  . CAD (dz of distal, mid and proximal RCA with implantation of 3 overlapping drug-eluting stent,) 09/03/2015  . Chronic diastolic heart failure, NYHA class 2 ([place]) 09/03/2015  . Angina pectoris associated with type 2 diabetes mellitus ([place]) 10/28/2014  . Hyperlipidemia 08/25/2014  . Limb pain  03/20/2013  . DJD (degenerative joint disease) 09/25/2012  . Migraine 09/25/2012  . Neuropathy 09/25/2012  . Restless legs syndrome (RLS) 09/25/2012  . Chronic obstructive pulmonary disease, unspecified ([place]) 04/25/2012  . Unknown cause of morbidity or mortality 04/25/2012  . Chronic total occlusion of artery of the extremities ([place]) 04/08/2012  . Onychomycosis 02/01/2012  . Occlusion and stenosis of carotid artery without mention of cerebral infarction 06/12/2011  . GERD (gastroesophageal reflux disease) 05/08/2011  . Barrett's esophagus without dysplasia 05/08/2011  . Former tobacco use 02/01/2011   Past Medical History:  Diagnosis Date  . Carotid artery occlusion 11/10/10   LEFT  CAROTID ENDARTERECTOMY  . Complication of anesthesia    BP WENT UP AT [facility] "  . COPD (chronic obstructive pulmonary disease) ([place])    pt denies this dx as of 06/01/20 - no inhaler   . Diverticulitis   . Diverticulosis of colon (without mention of hemorrhage)   . DJD (degenerative joint disease)    knees/hands/feet/back/neck  . Fatty liver   . Full dentures   . GERD (gastroesophageal reflux disease)   . H/O hiatal hernia   . History of blood transfusion    with a past surical procedure per patient 06/01/20  . Hyperlipidemia   . Hypertension   . Neuromuscular disorder ([place])    peripheral neuropathy  . Non-pressure chronic ulcer of other part of left foot limited to breakdown of skin ([place]) 11/12/2016  . Osteomyelitis ([place])    left 5th metatarsal  . PAD (peripheral artery disease) ([place])    Distal aortogram June 2012. Atherectomy left popliteal artery July 2012.   . Pseudoclaudication 11/15/2018  . Sleep apnea    pt denies this dx as of 06/01/20  . Slurred speech    AS PER WIFE IN D/C NOTE 11/10/10  . 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 Unstable angina ([place]) 09/16/2018  . Wears glasses     Family History  Problem Relation Age of Onset  . Heart disease Father        Before age 54-  CAD, BPG  . Diabetes  Father        Amputation  . Cancer Father        PROSTATE  . Hyperlipidemia Father   . Hypertension Father   . Heart attack Father        Triple BPG  . Varicose Veins Father   . Colon cancer Brother   . Diabetes Brother   . Heart disease Brother 67       A-Fib. Before age 20  . Hyperlipidemia Brother   . Hypertension Brother   . Cancer Sister        Breast  . Hyperlipidemia Sister   . Hypertension Sister   . Hypertension Son   . Arthritis Other        GRANDMOTHER  . Hypertension Other        OTHER FAMILY MEMBERS    Past Surgical History:  Procedure Laterality Date  . AMPUTATION  11/05/2011   Procedure: AMPUTATION RAY;  Surgeon: Wylene Simmer, MD;  Location: [facility];  Service: Orthopedics;  Laterality: Right;  Amputation of Right 4&5th Toes  . AMPUTATION Left 11/26/2012   Procedure: AMPUTATION RAY;  Surgeon: Wylene Simmer, MD;  Location: [facility];  Service: Orthopedics;  Laterality: Left;  fourth ray amputation  . AMPUTATION Right 08/27/2014   Procedure: Transmetatarsal Amputation;  Surgeon: Newt Minion, MD;  Location: [facility];  Service: Orthopedics;  Laterality: Right;  . AMPUTATION Right 01/14/2015   Procedure: AMPUTATION BELOW KNEE;  Surgeon: Newt Minion, MD;  Location: [facility];  Service: Orthopedics;  Laterality: Right;  . AMPUTATION Left 10/21/2015   Procedure: Left Foot 5th Ray Amputation;  Surgeon: Newt Minion, MD;  Location: [facility];  Service: Orthopedics;  Laterality: Left;  . ANTERIOR FUSION CERVICAL SPINE  02/06/06   C4-5, C5-6, C6-7; SURGEON DR. MAX COHEN  . AV FISTULA PLACEMENT Left 06/02/2020   Procedure: ARTERIOVENOUS (AV) FISTULA CREATION LEFT;  Surgeon: Waynetta Sandy, MD;  Location: [facility];  Service: Vascular;  Laterality: Left;  . BACK SURGERY     x  3  . BELOW KNEE LEG AMPUTATION Right   . CARDIAC CATHETERIZATION  10/31/04   2009  . CAROTID ENDARTERECTOMY  11/10/10  . CAROTID ENDARTERECTOMY Left 11/10/2010   Subtotal occlusion of left internal carotid artery  with left hemispheric transient ischemic attacks.  . CAROTID STENT    . CARPAL TUNNEL RELEASE Right 10/21/2013   Procedure: RIGHT CARPAL TUNNEL RELEASE;  Surgeon: Wynonia Sours, MD;  Location: [facility];  Service: Orthopedics;  Laterality: Right;  . CHOLECYSTECTOMY    . COLON SURGERY    . COLONOSCOPY    . COLOSTOMY REVERSAL  05/21/2018   ileostomy reversal  . CYSTOSCOPY WITH STENT PLACEMENT Bilateral 01/13/2018   Procedure: CYSTOSCOPY WITH BILATERAL URETERAL CATHETER PLACEMENT;  Surgeon: Ardis Hughs, MD;  Location: [facility];  Service: Urology;  Laterality: Bilateral;  . ESOPHAGEAL MANOMETRY Bilateral 07/19/2014   Procedure: ESOPHAGEAL MANOMETRY (EM);  Surgeon: Jerene Bears, MD;  [facility];  Service: Gastroenterology;  Laterality: Bilateral;  . FEMORAL ARTERY STENT     x6  . FINGER SURGERY    . FOOT SURGERY  04/25/2016    EXCISION BASE 5TH METATARSAL AND PARTIAL CUBOID LEFT FOOT  . HERNIA REPAIR     LEFT INGUINAL AND UMBILICAL REPAIRS  . HERNIA REPAIR    . I & D EXTREMITY Left 04/25/2016   Procedure: EXCISION BASE 5TH METATARSAL AND PARTIAL CUBOID LEFT FOOT;  Surgeon: Newt Minion, MD;  Location: [facility];  Service: Orthopedics;  Laterality: Left;  . ILEOSTOMY  01/13/2018   Procedure: ILEOSTOMY;  Surgeon: Clovis Riley, MD;  Location[facility];  Service: General;;  . ILEOSTOMY CLOSURE N/A 05/21/2018   Procedure: ILEOSTOMY REVERSAL ERAS PATHWAY;  Surgeon: Clovis Riley, MD;  Location: [facility];  Service: General;  Laterality: N/A;  . IR RADIOLOGIST EVAL & MGMT  11/19/2017  . IR RADIOLOGIST EVAL & MGMT  12/03/2017  . IR RADIOLOGIST EVAL & MGMT  12/18/2017  . JOINT REPLACEMENT Right 2001   Total knee  . LAMINECTOMY     X 3 LUMBAR AND X 2 CERVICAL SPINE OPERATIONS  . LAPAROSCOPIC CHOLECYSTECTOMY W/ CHOLANGIOGRAPHY  11/09/04   SURGEON DR. Luella Cook  . LEFT HEART CATH AND CORONARY ANGIOGRAPHY N/A 09/16/2018   Procedure: LEFT HEART CATH AND CORONARY ANGIOGRAPHY;   Surgeon: Nigel Mormon, MD;  Location: [facility];  Service: Cardiovascular;  Laterality: N/A;  . LEFT HEART CATHETERIZATION WITH CORONARY ANGIOGRAM N/A 10/29/2014   Procedure: LEFT HEART CATHETERIZATION WITH CORONARY ANGIOGRAM;  Surgeon: Laverda Page, MD;  Location: [facility];  Service: Cardiovascular;  Laterality: N/A;  . LOWER EXTREMITY ANGIOGRAM N/A 03/19/2012   Procedure: LOWER EXTREMITY ANGIOGRAM;  Surgeon: Burnell Blanks, MD;  Location: [facility];  Service: Cardiovascular;  Laterality: N/A;  . NECK SURGERY    . PARTIAL COLECTOMY N/A 01/13/2018   Procedure: LAPAROSCOPIC ASSISTED   SIGMOID COLECTOMY ILEOSTOMY;  Surgeon: Clovis Riley, MD;  Location[facility];  Service: General;  Laterality: N/A;  . PENILE PROSTHESIS IMPLANT  08/14/05   INFRAPUBIC INSERTION OF INFLATABLE PENILE PROSTHESIS; SURGEON DR. Amalia Hailey  . PENILE PROSTHESIS IMPLANT    . PERCUTANEOUS CORONARY STENT INTERVENTION (PCI-S) Right 10/29/2014   Procedure: PERCUTANEOUS CORONARY STENT INTERVENTION (PCI-S);  Surgeon: Laverda Page, MD;  Location[facility];  Service: Cardiovascular;  Laterality: Right;  . SHOULDER ARTHROSCOPY    . SPINE SURGERY    . TOE AMPUTATION Left   .  TONSILLECTOMY    . TOTAL KNEE ARTHROPLASTY  07/2002   RIGHT KNEE ; SURGEON  DR. [name] ALSO HAD ARTHROSCOPIC RIGHT KNEE IN  10/2001  . TOTAL KNEE ARTHROPLASTY    . ULNAR NERVE TRANSPOSITION Right 10/21/2013   Procedure: RIGHT ELBOW  ULNAR NERVE DECOMPRESSION;  Surgeon: Wynonia Sours, MD;  Location: [facility];  Service: Orthopedics;  Laterality: Right;   Social History   Occupational History  . Occupation: Magazine features editor: UNEMPLOYED  Tobacco Use  . Smoking status: Current Every Day Smoker    Packs/day: 2.00    Years: 35.00    Pack years: 70.00    Types: Cigarettes, E-cigarettes    Last attempt to quit: 10/28/2011    Years since quitting: 8.7  . Smokeless tobacco: Never Used  Vaping Use  .  Vaping Use: Every day  . Substances: Nicotine  Substance and Sexual Activity  . Alcohol use: Not Currently    Comment: "not in a long time"  . Drug use: Not Currently  . Sexual activity: Yes    Birth control/protection: Implant    Comment: penile implant

## 2020-07-14 ENCOUNTER — Ambulatory Visit (INDEPENDENT_AMBULATORY_CARE_PROVIDER_SITE_OTHER): Payer: 59 | Admitting: Orthopedic Surgery

## 2020-07-14 ENCOUNTER — Other Ambulatory Visit: Payer: Self-pay

## 2020-07-14 ENCOUNTER — Telehealth: Payer: Self-pay

## 2020-07-14 DIAGNOSIS — Z89511 Acquired absence of right leg below knee: Secondary | ICD-10-CM

## 2020-07-14 DIAGNOSIS — L97221 Non-pressure chronic ulcer of left calf limited to breakdown of skin: Secondary | ICD-10-CM | POA: Diagnosis not present

## 2020-07-14 DIAGNOSIS — I6523 Occlusion and stenosis of bilateral carotid arteries: Secondary | ICD-10-CM

## 2020-07-14 NOTE — Telephone Encounter (Signed)
Faxed updated wound care orders per pt request to [facility] phone# [phone] and fax # [phone] this has been sent over to call with questions.

## 2020-07-18 ENCOUNTER — Other Ambulatory Visit: Payer: Self-pay

## 2020-07-18 ENCOUNTER — Encounter (HOSPITAL_COMMUNITY)
Admission: RE | Admit: 2020-07-18 | Discharge: 2020-07-18 | Disposition: A | Discharge status: Home / Self Care | Payer: 59 | Source: Ambulatory Visit | Attending: Nephrology | Admitting: Nephrology

## 2020-07-18 DIAGNOSIS — D631 Anemia in chronic kidney disease: Secondary | ICD-10-CM | POA: Insufficient documentation

## 2020-07-18 DIAGNOSIS — N189 Chronic kidney disease, unspecified: Secondary | ICD-10-CM | POA: Insufficient documentation

## 2020-07-18 LAB — POCT HEMOGLOBIN-HEMACUE: Hemoglobin: 11.3 g/dL — ABNORMAL LOW (ref 13.0–17.0)

## 2020-07-18 MED ORDER — EPOETIN ALFA-EPBX 10000 UNIT/ML IJ SOLN
20000.0000 [IU] | INTRAMUSCULAR | Status: DC
  Administered 2020-07-18: 20000 [IU] via SUBCUTANEOUS

## 2020-07-18 MED ORDER — EPOETIN ALFA-EPBX 10000 UNIT/ML IJ SOLN
INTRAMUSCULAR | Status: AC
  Filled 2020-07-18: qty 2

## 2020-07-19 ENCOUNTER — Encounter: Payer: Self-pay | Admitting: Orthopedic Surgery

## 2020-07-19 NOTE — Progress Notes (Signed)
Office Visit Note   Patient: [name]           Date of Birth: Apr 25, 1956           MRN: [id number] Visit Date: 07/14/2020              Requested by: [name], PA-C [address] [place],  [place] [address] PCP: [name], PA-C  Chief Complaint  Patient presents with  . Left Leg - Edema      HPI: Patient is a 64 year old gentleman who is seen in follow-up for his left leg he states there is still some swelling and drainage.  He also states that he recently burned his left thigh.  Patient states that the leg brace that he is wearing rubbed a hole in the proximal medial aspect of the left leg.  Assessment & Plan: Visit Diagnoses:  1. Skin ulcer of calf, left, limited to breakdown of skin ([place])   2. History of right below knee amputation ([place])     Plan: We will set up home health nursing for compression wrap dressing changes with Silvadene and Dynaflex once a week with home health nursing plan to follow-up in the office in 4 weeks.  Compression wrap with silver cell applied today.  3 layer Dynaflex.  Follow-Up Instructions: Return in about 4 weeks (around 08/11/2020).   Ortho Exam  Patient is alert, oriented, no adenopathy, well-dressed, normal affect, normal respiratory effort. Examination patient has a superficial burn to the left thigh.  There is no cellulitis no drainage no signs of infection.  Patient does have a wound breakdown of the proximal medial aspect of the left leg from where the brace rubbed a hole in his leg this is about 2 cm in diameter with healthy granulation tissue.  Imaging: No results found. No images are attached to the encounter.  Labs: Lab Results  Component Value Date   HGBA1C 5.7 (H) 09/16/2018   HGBA1C 7.7 (H) 05/15/2018   HGBA1C 7.1 (H) 01/07/2018   ESRSEDRATE 35 (H) 09/03/2016   ESRSEDRATE 111 (H) 05/11/2016   ESRSEDRATE 98 (H) 05/04/2016   CRP 1.2 (H) 09/03/2016   CRP 4.7 (H) 05/11/2016   CRP 8.0 (H) 05/04/2016    REPTSTATUS 11/15/2017 FINAL 11/10/2017   GRAMSTAIN  11/04/2017    ABUNDANT WBC PRESENT, PREDOMINANTLY PMN MODERATE GRAM NEGATIVE RODS MODERATE GRAM POSITIVE COCCI Performed at [facility], [place] [address] [place],  [address]    CULT  11/10/2017    NO GROWTH 5 DAYS Performed at [facility] [address], [place],  [address]    LABORGA STAPHYLOCOCCUS AUREUS 09/26/2014     Lab Results  Component Value Date   ALBUMIN 3.7 (L) 05/30/2020   ALBUMIN 2.9 (L) 06/19/2019   ALBUMIN 3.5 05/15/2018   PREALBUMIN 19.0 05/04/2016   PREALBUMIN 25.0 12/16/2015    Lab Results  Component Value Date   MG 1.9 05/24/2018   MG 2.1 05/23/2018   MG 1.3 (L) 05/22/2018   No results found for: VD25OH  Lab Results  Component Value Date   PREALBUMIN 19.0 05/04/2016   PREALBUMIN 25.0 12/16/2015   CBC EXTENDED Latest Ref Rng & Units 07/18/2020 07/04/2020 06/20/2020  WBC 3.4 - 10.8 x10E3/uL - - -  RBC 4.14 - 5.80 x10E6/uL - - -  HGB 13.0 - 17.0 g/dL 11.3(L) 11.3(L) 11.6(L)  HCT 39 - 52 % - - -  PLT 150 - 450 x10E3/uL - - -  NEUTROABS 1.7 - 7.7 K/uL - - -  LYMPHSABS 0.7 - 4.0 K/uL - - -     There is no height or weight on file to calculate BMI.  Orders:  No orders of the defined types were placed in this encounter.  No orders of the defined types were placed in this encounter.    Procedures: No procedures performed  Clinical Data: No additional findings.  ROS:  All other systems negative, except as noted in the HPI. Review of Systems  Objective: Vital Signs: There were no vitals taken for this visit.  Specialty Comments:  No specialty comments available.  PMFS History: Patient Active Problem List   Diagnosis Date Noted  . Carotid stenosis 06/15/2020  . Dyslipidemia 06/15/2020  . Severe nonproliferative diabetic retinopathy of right eye, with macular edema, associated with type 2 diabetes mellitus ([place]) 01/18/2020  . Severe nonproliferative  diabetic retinopathy of left eye, with macular edema, associated with type 2 diabetes mellitus ([place]) 01/04/2020  . Retinal hemorrhage of right eye 01/04/2020  . 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 Pseudoclaudication 11/15/2018  . Essential hypertension 09/18/2018  . S/P BKA (below knee amputation), right ([place]) 09/18/2018  . Amputation of toe of left foot ([place]) 09/18/2018  . Peripheral artery disease ([place]) 09/18/2018  . S/P carotid endarterectomy 09/18/2018  . OSA on CPAP 09/18/2018  . Chronic back pain 09/18/2018  . Status post reversal of ileostomy 05/21/2018  . Normocytic anemia 02/14/2018  . Intra-abdominal abscess ([place]) 11/04/2017  . Chronic venous hypertension (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 ([place]) 12/11/2016  . Unilateral primary osteoarthritis, left knee 10/11/2016  . History of right below knee amputation ([place]) 07/12/2016  . Diabetic foot infection ([place]) 05/04/2016  . Type 2 diabetes mellitus with insulin therapy ([place]) 12/16/2015  . Amputated toe ([place]) 10/21/2015  . Leg edema, left 09/03/2015  . CAD (dz of distal, mid and proximal RCA with implantation of 3 overlapping drug-eluting stent,) 09/03/2015  . Chronic diastolic heart failure, NYHA class 2 ([place]) 09/03/2015  . Angina pectoris associated with type 2 diabetes mellitus ([place]) 10/28/2014  . Hyperlipidemia 08/25/2014  . Limb pain 03/20/2013  . DJD (degenerative joint disease) 09/25/2012  . Migraine 09/25/2012  . Neuropathy 09/25/2012  . Restless legs syndrome (RLS) 09/25/2012  . Chronic obstructive pulmonary disease, unspecified ([place]) 04/25/2012  . Unknown cause of morbidity or mortality 04/25/2012  . Chronic total occlusion of artery of the extremities ([place]) 04/08/2012  . Onychomycosis 02/01/2012  . Occlusion and stenosis of carotid artery without mention of cerebral infarction 06/12/2011  . GERD (gastroesophageal reflux disease) 05/08/2011  . Barrett's esophagus without dysplasia  05/08/2011  . Former tobacco use 02/01/2011   Past Medical History:  Diagnosis Date  . Carotid artery occlusion 11/10/10   LEFT CAROTID ENDARTERECTOMY  . Complication of anesthesia    BP WENT UP AT [facility] "  . COPD (chronic obstructive pulmonary disease) ([place])    pt denies this dx as of 06/01/20 - no inhaler   . Diverticulitis   . Diverticulosis of colon (without mention of hemorrhage)   . DJD (degenerative joint disease)    knees/hands/feet/back/neck  . Fatty liver   . Full dentures   . GERD (gastroesophageal reflux disease)   . H/O hiatal hernia   . History of blood transfusion    with a past surical procedure per patient 06/01/20  . Hyperlipidemia   . Hypertension   . Neuromuscular disorder ([place])    peripheral neuropathy  . Non-pressure  chronic ulcer of other part of left foot limited to breakdown of skin ([place]) 11/12/2016  . Osteomyelitis ([place])    left 5th metatarsal  . PAD (peripheral artery disease) ([place])    Distal aortogram June 2012. Atherectomy left popliteal artery July 2012.   . Pseudoclaudication 11/15/2018  . Sleep apnea    pt denies this dx as of 06/01/20  . Slurred speech    AS PER WIFE IN D/C NOTE 11/10/10  . 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 Unstable angina ([place]) 09/16/2018  . Wears glasses     Family History  Problem Relation Age of Onset  . Heart disease Father        Before age 36-  CAD, BPG  . Diabetes Father        Amputation  . Cancer Father        PROSTATE  . Hyperlipidemia Father   . Hypertension Father   . Heart attack Father        Triple BPG  . Varicose Veins Father   . Colon cancer Brother   . Diabetes Brother   . Heart disease Brother 55       A-Fib. Before age 43  . Hyperlipidemia Brother   . Hypertension Brother   . Cancer Sister        Breast  . Hyperlipidemia Sister   . Hypertension Sister   . Hypertension Son   . Arthritis Other        GRANDMOTHER  . Hypertension Other        OTHER FAMILY MEMBERS    Past Surgical History:    Procedure Laterality Date  . AMPUTATION  11/05/2011   Procedure: AMPUTATION RAY;  Surgeon: Wylene Simmer, MD;  Location: [facility];  Service: Orthopedics;  Laterality: Right;  Amputation of Right 4&5th Toes  . AMPUTATION Left 11/26/2012   Procedure: AMPUTATION RAY;  Surgeon: Wylene Simmer, MD;  Location: [facility];  Service: Orthopedics;  Laterality: Left;  fourth ray amputation  . AMPUTATION Right 08/27/2014   Procedure: Transmetatarsal Amputation;  Surgeon: Newt Minion, MD;  Location: [facility];  Service: Orthopedics;  Laterality: Right;  . AMPUTATION Right 01/14/2015   Procedure: AMPUTATION BELOW KNEE;  Surgeon: Newt Minion, MD;  Location: [facility];  Service: Orthopedics;  Laterality: Right;  . AMPUTATION Left 10/21/2015   Procedure: Left Foot 5th Ray Amputation;  Surgeon: Newt Minion, MD;  Location: [facility];  Service: Orthopedics;  Laterality: Left;  . ANTERIOR FUSION CERVICAL SPINE  02/06/06   C4-5, C5-6, C6-7; SURGEON DR. MAX COHEN  . AV FISTULA PLACEMENT Left 06/02/2020   Procedure: ARTERIOVENOUS (AV) FISTULA CREATION LEFT;  Surgeon: Waynetta Sandy, MD;  Location: [facility];  Service: Vascular;  Laterality: Left;  . BACK SURGERY     x 3  . BELOW KNEE LEG AMPUTATION Right   . CARDIAC CATHETERIZATION  10/31/04   2009  . CAROTID ENDARTERECTOMY  11/10/10  . CAROTID ENDARTERECTOMY Left 11/10/2010   Subtotal occlusion of left internal carotid artery with left hemispheric transient ischemic attacks.  . CAROTID STENT    . CARPAL TUNNEL RELEASE Right 10/21/2013   Procedure: RIGHT CARPAL TUNNEL RELEASE;  Surgeon: Wynonia Sours, MD;  Location: [facility];  Service: Orthopedics;  Laterality: Right;  . CHOLECYSTECTOMY    . COLON SURGERY    . COLONOSCOPY    . COLOSTOMY REVERSAL  05/21/2018   ileostomy reversal  . CYSTOSCOPY WITH STENT PLACEMENT Bilateral 01/13/2018   Procedure: CYSTOSCOPY WITH  BILATERAL URETERAL CATHETER PLACEMENT;  Surgeon: Ardis Hughs, MD;  Location: [facility];  Service:  Urology;  Laterality: Bilateral;  . ESOPHAGEAL MANOMETRY Bilateral 07/19/2014   Procedure: ESOPHAGEAL MANOMETRY (EM);  Surgeon: Jerene Bears, MD;  Location[facility];  Service: Gastroenterology;  Laterality: Bilateral;  . FEMORAL ARTERY STENT     x6  . FINGER SURGERY    . FOOT SURGERY  04/25/2016    EXCISION BASE 5TH METATARSAL AND PARTIAL CUBOID LEFT FOOT  . HERNIA REPAIR     LEFT INGUINAL AND UMBILICAL REPAIRS  . HERNIA REPAIR    . I & D EXTREMITY Left 04/25/2016   Procedure: EXCISION BASE 5TH METATARSAL AND PARTIAL CUBOID LEFT FOOT;  Surgeon: Newt Minion, MD;  Location: [facility];  Service: Orthopedics;  Laterality: Left;  . ILEOSTOMY  01/13/2018   Procedure: ILEOSTOMY;  Surgeon: Clovis Riley, MD;  Location[facility];  Service: General;;  . ILEOSTOMY CLOSURE N/A 05/21/2018   Procedure: ILEOSTOMY REVERSAL ERAS PATHWAY;  Surgeon: Clovis Riley, MD;  Location: [facility];  Service: General;  Laterality: N/A;  . IR RADIOLOGIST EVAL & MGMT  11/19/2017  . IR RADIOLOGIST EVAL & MGMT  12/03/2017  . IR RADIOLOGIST EVAL & MGMT  12/18/2017  . JOINT REPLACEMENT Right 2001   Total knee  . LAMINECTOMY     X 3 LUMBAR AND X 2 CERVICAL SPINE OPERATIONS  . LAPAROSCOPIC CHOLECYSTECTOMY W/ CHOLANGIOGRAPHY  11/09/04   SURGEON DR. Luella Cook  . LEFT HEART CATH AND CORONARY ANGIOGRAPHY N/A 09/16/2018   Procedure: LEFT HEART CATH AND CORONARY ANGIOGRAPHY;  Surgeon: Nigel Mormon, MD;  Location: [facility];  Service: Cardiovascular;  Laterality: N/A;  . LEFT HEART CATHETERIZATION WITH CORONARY ANGIOGRAM N/A 10/29/2014   Procedure: LEFT HEART CATHETERIZATION WITH CORONARY ANGIOGRAM;  Surgeon: Laverda Page, MD;  Location[facility];  Service: Cardiovascular;  Laterality: N/A;  . LOWER EXTREMITY ANGIOGRAM N/A 03/19/2012   Procedure: LOWER EXTREMITY ANGIOGRAM;  Surgeon: Burnell Blanks, MD;  Location[facility] LAB;  Service: Cardiovascular;  Laterality: N/A;  . NECK SURGERY    . PARTIAL  COLECTOMY N/A 01/13/2018   Procedure: LAPAROSCOPIC ASSISTED   SIGMOID COLECTOMY ILEOSTOMY;  Surgeon: Clovis Riley, MD;  [facility];  Service: General;  Laterality: N/A;  . PENILE PROSTHESIS IMPLANT  08/14/05   INFRAPUBIC INSERTION OF INFLATABLE PENILE PROSTHESIS; SURGEON DR. Amalia Hailey  . PENILE PROSTHESIS IMPLANT    . PERCUTANEOUS CORONARY STENT INTERVENTION (PCI-S) Right 10/29/2014   Procedure: PERCUTANEOUS CORONARY STENT INTERVENTION (PCI-S);  Surgeon: Laverda Page, MD;  Location: [facility] CATH LAB;  Service: Cardiovascular;  Laterality: Right;  . SHOULDER ARTHROSCOPY    . SPINE SURGERY    . TOE AMPUTATION Left   . TONSILLECTOMY    . TOTAL KNEE ARTHROPLASTY  07/2002   RIGHT KNEE ; SURGEON  DR. [name] ALSO HAD ARTHROSCOPIC RIGHT KNEE IN  10/2001  . TOTAL KNEE ARTHROPLASTY    . ULNAR NERVE TRANSPOSITION Right 10/21/2013   Procedure: RIGHT ELBOW  ULNAR NERVE DECOMPRESSION;  Surgeon: Wynonia Sours, MD;  Location: [facility];  Service: Orthopedics;  Laterality: Right;   Social History   Occupational History  . Occupation: Magazine features editor: UNEMPLOYED  Tobacco Use  . Smoking status: Current Every Day Smoker    Packs/day: 2.00    Years: 35.00    Pack years: 70.00    Types: Cigarettes, E-cigarettes  Last attempt to quit: 10/28/2011    Years since quitting: 8.7  . Smokeless tobacco: Never Used  Vaping Use  . Vaping Use: Every day  . Substances: Nicotine  Substance and Sexual Activity  . Alcohol use: Not Currently    Comment: "not in a long time"  . Drug use: Not Currently  . Sexual activity: Yes    Birth control/protection: Implant    Comment: penile implant

## 2020-07-20 ENCOUNTER — Encounter (HOSPITAL_COMMUNITY): Payer: Self-pay | Admitting: Vascular Surgery

## 2020-07-20 ENCOUNTER — Other Ambulatory Visit (HOSPITAL_COMMUNITY)
Admission: RE | Admit: 2020-07-20 | Discharge: 2020-07-20 | Disposition: A | Discharge status: Home / Self Care | Payer: 59 | Source: Ambulatory Visit | Attending: Vascular Surgery | Admitting: Vascular Surgery

## 2020-07-20 DIAGNOSIS — Z20822 Contact with and (suspected) exposure to covid-19: Secondary | ICD-10-CM | POA: Diagnosis not present

## 2020-07-20 DIAGNOSIS — Z01812 Encounter for preprocedural laboratory examination: Secondary | ICD-10-CM | POA: Diagnosis present

## 2020-07-20 LAB — SARS CORONAVIRUS 2 (TAT 6-24 HRS): SARS Coronavirus 2: NEGATIVE

## 2020-07-20 MED ORDER — DEXTROSE 5 % IV SOLN
3.0000 g | INTRAVENOUS | Status: DC
  Filled 2020-07-20: qty 3000

## 2020-07-20 NOTE — Progress Notes (Signed)
SDW Phone call  PCP - [name] PA Cardiologist - Dr. Adrian Prows  Chest x-ray - 09/16/18 EKG - 05/27/20 Stress Test - Yes ECHO - 09/17/18 Cardiac Cath - 09/16/18  Sleep Study - 01/24/09 Denies OSA CPAP - No  DM Type II Fasting Blood Sugar - Averages 140-230 throughout  Checks Blood Sugar ___3-5__ times a day  Blood Thinner Instructions: Last dose of Plavix per Dr Claretha Cooper office 07/18/20. Patient verified. Aspirin Instructions: Take through DOS  Medications to take on DOS:  amLODipine (NORVASC) 10 MG tablet, aspirin EC 81 MG tablet, BIDIL 20-37.5 MG tablet, carvedilol (COREG) 25 MG tablet, doxycycline (VIBRA-TABS) 100 MG tablet, ferrous gluconate (FERGON) 324 MG tablet, loratadine (CLARITIN) 10 MG tablet, pantoprazole (PROTONIX) 40 MG tablet, pregabalin (LYRICA) 100 MG capsule, tamsulosin (FLOMAX) 0.4 MG CAPS capsule, oxyCODONE-acetaminophen (PERCOCET) 7.5-325 MG   IF NEEDED: ondansetron (ZOFRAN) 4 MG tablet,     Humalog Kwikpen - Day before surgery take usual am dose but no pm dose.  On day of surgery if CBG > 220 take 1/2 usual dose.   Lantus - Takes only at night 50% of pm dose (27 units).   YOU WILL NEED TO HOLD YOUR  PLAVIX FOR 3 DAYS BEFORE YOUR PROCEDURE. TAKE YOUR LAST DOSE ON 07/18/20.  REMEMBER NOT TO EAT OR DRINK ANYTHING AFTER MIDNIGHT THE NIGHT  BEFORE YOUR PROCEDURE.  Do not wear jewelry, make-up or nail polish on surgery/ procedure day.  Do not wear lotions, powders, or perfumes. You may wear deodorant. Men may shave their face and neck area.  Do not bring valuables to the hospital.  [facility] IS NOT RESPONSIBLE FOR ANY BELONGINGS OR VALUABLES.  Contacts, dentures, or bridgework may not be worn into surgery. Please wear clean, loose fitting clothes (clothes that open in the front are easier to put on after your surgery/ procedure.)  COVID TEST-  07/20/20   Anesthesia review: Yes cardiac history   All instructions explained to the patient, with a verbal  understanding  Patient also instructed to self quarantine after being tested for COVID-19. The opportunity to ask questions was provided.

## 2020-07-20 NOTE — Anesthesia Preprocedure Evaluation (Addendum)
Anesthesia Evaluation  Patient identified by MRN, date of birth, ID band Patient awake    Reviewed: Allergy & Precautions, H&P , NPO status , Patient's Chart, lab work & pertinent test results  History of Anesthesia Complications (+) history of anesthetic complications  Airway Mallampati: II  TM Distance: >3 FB Neck ROM: Full    Dental no notable dental hx. (+) Edentulous Upper, Edentulous Lower   Pulmonary neg pulmonary ROS, sleep apnea , COPD, Current SmokerPatient did not abstain from smoking., former smoker,    Pulmonary exam normal breath sounds clear to auscultation       Cardiovascular Exercise Tolerance: Good hypertension, Pt. on medications + angina + CAD and + Peripheral Vascular Disease  negative cardio ROS Normal cardiovascular exam+ dysrhythmias  Rhythm:Regular Rate:Normal     Neuro/Psych negative neurological ROS  negative psych ROS   GI/Hepatic negative GI ROS, Neg liver ROS, hiatal hernia, GERD  Medicated,  Endo/Other  negative endocrine ROSdiabetes, Insulin Dependent  Renal/GU ESRFRenal diseasenegative Renal ROS  negative genitourinary   Musculoskeletal negative musculoskeletal ROS (+) Arthritis ,   Abdominal   Peds negative pediatric ROS (+)  Hematology negative hematology ROS (+) Blood dyscrasia, anemia ,   Anesthesia Other Findings   Reproductive/Obstetrics negative OB ROS                            Anesthesia Physical  Anesthesia Plan  ASA: III  Anesthesia Plan: MAC   Post-op Pain Management:    Induction: Intravenous  PONV Risk Score and Plan: 1 and Propofol infusion  Airway Management Planned: Simple Face Mask and Nasal Cannula  Additional Equipment:   Intra-op Plan:   Post-operative Plan:   Informed Consent: I have reviewed the patients History and Physical, chart, labs and discussed the procedure including the risks, benefits and alternatives for  the proposed anesthesia with the patient or authorized representative who has indicated his/her understanding and acceptance.     Dental advisory given  Plan Discussed with: CRNA and Anesthesiologist  Anesthesia Plan Comments: (PAT note by Karoline Caldwell, PA-C:  Follows with cardiology for hx of severe peripheral arterial disease and has history of right below-knee amputation and toe lamputations on the left, multiple peripheral interventions in the past, coronary artery disease angioplasty to the right coronary artery on 10/29/2014, right carotid endarterectomy in 2012. Coronary Angiography 09/17/2018 revealed widely patent stent and no significant new disease. Last seen by [name] 05/27/2020.  Per note, he remains angina free.  He is noted to be essentially wheelchair-bound.  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  No changes made to management that time, advised to follow-up in 6 months.  Poorly controlled IDDM 2.  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  She also follows him for anemia, and he receives iron infusions.  CMP and CBC from 05/30/2020 reviewed, creatinine 4.28 consistent with CKD 5, glucose 226 consistent with uncontrolled DM2, mild anemia with hemoglobin 11.5. Otherwise unremarkable.  EKG 05/27/2020: Sinus bradycardia at a rate of 57 bpm with first degree AV block, left atrial enlargement. Left axis deviation. Left anterior fascicular block. Complete right bundle branch block. Left ventricular hypertrophy. Non-specific T wave abnormality. No evidence of ischemia.  Compared to EKG 11/25/2019, no significant change.  Lexiscan sestamibi stress test 03/18/2017: 1. The resting electrocardiogram demonstrated normal sinus rhythm, RBBB and no resting arrhythmias. Stress EKG is non-diagnostic for  ischemia as it a pharmacologic stress using Lexiscan. Occasional PVC noted. Stress symptoms included dyspnea, dizziness and  chest pain. 2. The LV is dilated both at rest and stress images. The LV end diastolic volume was 379KW. Perfusion images reveal inferior soft tissue attenuation artifact. There is no ischemia or scar. Overall left ventricular systolic function was normal without regional wall motion abnormalities. The left ventricular ejection fraction was calculated to be 51%. This is a low risk study. Compared to the study done on 10/15/2014, inferior wall ischemia and associated inferior hypokinesis is no longer present.  Coronary angiogram 09/17/18: Widely patent stent stents. Normal LVEF. Coronary Angiogram 10/29/2014: CAD s/p stenting of the distal, mid and proximal RCA with implantation of 3 overlapping drug-eluting stent, from distal to proximal 2.5 x 38 mm, 2.75 x 38 mm and a 2.75 x 20 mm Promus premier DES.  Echocardiogram 09/16/2018:  Left ventricle: The cavity size was normal. There was moderate concentric hypertrophy. Systolic function was normal. Theestimated ejection fraction was in the range of 55% to 60%.Features are consistent with a pseudonormal left ventricularfilling pattern, with concomitant abnormal relaxation andincreased filling pressure (grade 2 diastolic dysfunction).Doppler parameters are consistent with both elevated ventricularend-diastolic filling pressure and elevated left atrial fillingpressure. Mitral valve: Calcified annulus. Left atrium: The atrium was mildly dilated. Inferior vena cava: The vessel was dilated. The respirophasicdiameter changes was normal, may suggest elevated central venouspressure.  Carotid artery duplex 06/09/2019:  Minimal stenosis in bilateral ICA of 1-15%. Heterogeneous plaque. Stenosis  in the right external carotid artery (<50%).  Left carotid endarterectomy site is patent.  Right vertebral artery flow is not well visualized. Antegrade left  vertebral artery flow.  No significant change from 03/01/2017. Follow up studies when clinically  Indicated.  )         Anesthesia Quick Evaluation

## 2020-07-21 ENCOUNTER — Encounter (HOSPITAL_COMMUNITY)
Admission: RE | Disposition: A | Discharge status: Home / Self Care | Payer: Self-pay | Source: Home / Self Care | Attending: Vascular Surgery

## 2020-07-21 ENCOUNTER — Ambulatory Visit (HOSPITAL_COMMUNITY)
Admission: RE | Admit: 2020-07-21 | Discharge: 2020-07-21 | Disposition: A | Discharge status: Home / Self Care | Payer: 59 | Attending: Vascular Surgery | Admitting: Vascular Surgery

## 2020-07-21 ENCOUNTER — Other Ambulatory Visit: Payer: Self-pay

## 2020-07-21 ENCOUNTER — Ambulatory Visit (HOSPITAL_COMMUNITY): Payer: 59 | Admitting: Physician Assistant

## 2020-07-21 ENCOUNTER — Encounter (HOSPITAL_COMMUNITY): Payer: Self-pay | Admitting: Vascular Surgery

## 2020-07-21 DIAGNOSIS — Z95820 Peripheral vascular angioplasty status with implants and grafts: Secondary | ICD-10-CM | POA: Insufficient documentation

## 2020-07-21 DIAGNOSIS — Z89511 Acquired absence of right leg below knee: Secondary | ICD-10-CM | POA: Diagnosis not present

## 2020-07-21 DIAGNOSIS — N185 Chronic kidney disease, stage 5: Secondary | ICD-10-CM

## 2020-07-21 DIAGNOSIS — I739 Peripheral vascular disease, unspecified: Secondary | ICD-10-CM | POA: Insufficient documentation

## 2020-07-21 DIAGNOSIS — N189 Chronic kidney disease, unspecified: Secondary | ICD-10-CM | POA: Diagnosis present

## 2020-07-21 HISTORY — PX: BASCILIC VEIN TRANSPOSITION: SHX5742

## 2020-07-21 HISTORY — PX: LIGATION OF COMPETING BRANCHES OF ARTERIOVENOUS FISTULA: SHX5949

## 2020-07-21 HISTORY — DX: Chronic kidney disease, unspecified: N18.9

## 2020-07-21 LAB — GLUCOSE, CAPILLARY
Glucose-Capillary: 192 mg/dL — ABNORMAL HIGH (ref 70–99)
Glucose-Capillary: 158 mg/dL — ABNORMAL HIGH (ref 70–99)

## 2020-07-21 SURGERY — TRANSPOSITION, VEIN, BASILIC
Anesthesia: Monitor Anesthesia Care | Site: Arm Upper | Laterality: Left

## 2020-07-21 MED ORDER — MIDAZOLAM HCL 5 MG/5ML IJ SOLN
INTRAMUSCULAR | Status: DC | PRN
  Administered 2020-07-21: 2 mg via INTRAVENOUS

## 2020-07-21 MED ORDER — OXYCODONE HCL 5 MG PO TABS
5.0000 mg | ORAL_TABLET | Freq: Once | ORAL | Status: AC | PRN
  Administered 2020-07-21: 5 mg via ORAL

## 2020-07-21 MED ORDER — SODIUM CHLORIDE 0.9 % IV SOLN: INTRAVENOUS | Status: DC

## 2020-07-21 MED ORDER — LIDOCAINE HCL (PF) 1 % IJ SOLN
INTRAMUSCULAR | Status: AC
  Filled 2020-07-21: qty 30

## 2020-07-21 MED ORDER — ONDANSETRON HCL 4 MG/2ML IJ SOLN
INTRAMUSCULAR | Status: DC | PRN
  Administered 2020-07-21: 4 mg via INTRAVENOUS

## 2020-07-21 MED ORDER — LIDOCAINE 2% (20 MG/ML) 5 ML SYRINGE
INTRAMUSCULAR | Status: DC | PRN
  Administered 2020-07-21: 100 mg via INTRAVENOUS

## 2020-07-21 MED ORDER — ONDANSETRON HCL 4 MG/2ML IJ SOLN
INTRAMUSCULAR | Status: AC
  Filled 2020-07-21: qty 2

## 2020-07-21 MED ORDER — MEPERIDINE HCL 25 MG/ML IJ SOLN: 6.2500 mg | INTRAMUSCULAR | Status: DC | PRN

## 2020-07-21 MED ORDER — ACETAMINOPHEN 160 MG/5ML PO SOLN: 325.0000 mg | ORAL | Status: DC | PRN

## 2020-07-21 MED ORDER — DEXTROSE 5 % IV SOLN
INTRAVENOUS | Status: DC | PRN
  Administered 2020-07-21: 3 g via INTRAVENOUS

## 2020-07-21 MED ORDER — FENTANYL CITRATE (PF) 250 MCG/5ML IJ SOLN
INTRAMUSCULAR | Status: AC
  Filled 2020-07-21: qty 5

## 2020-07-21 MED ORDER — CHLORHEXIDINE GLUCONATE 4 % EX LIQD: 60.0000 mL | Freq: Once | CUTANEOUS | Status: DC

## 2020-07-21 MED ORDER — MIDAZOLAM HCL 2 MG/2ML IJ SOLN
INTRAMUSCULAR | Status: AC
  Filled 2020-07-21: qty 2

## 2020-07-21 MED ORDER — SODIUM CHLORIDE 0.9 % IV SOLN
INTRAVENOUS | Status: DC | PRN
  Administered 2020-07-21: 500 mL

## 2020-07-21 MED ORDER — 0.9 % SODIUM CHLORIDE (POUR BTL) OPTIME
TOPICAL | Status: DC | PRN
  Administered 2020-07-21: 1000 mL

## 2020-07-21 MED ORDER — ORAL CARE MOUTH RINSE: 15.0000 mL | Freq: Once | OROMUCOSAL | Status: AC

## 2020-07-21 MED ORDER — ACETAMINOPHEN 325 MG PO TABS: 325.0000 mg | ORAL_TABLET | ORAL | Status: DC | PRN

## 2020-07-21 MED ORDER — PROPOFOL 10 MG/ML IV BOLUS
INTRAVENOUS | Status: DC | PRN
  Administered 2020-07-21: 100 mg via INTRAVENOUS

## 2020-07-21 MED ORDER — SODIUM CHLORIDE 0.9 % IV SOLN
INTRAVENOUS | Status: AC
  Filled 2020-07-21: qty 1.2

## 2020-07-21 MED ORDER — OXYCODONE HCL 5 MG/5ML PO SOLN: 5.0000 mg | Freq: Once | ORAL | Status: AC | PRN

## 2020-07-21 MED ORDER — ROCURONIUM BROMIDE 10 MG/ML (PF) SYRINGE
PREFILLED_SYRINGE | INTRAVENOUS | Status: AC
  Filled 2020-07-21: qty 10

## 2020-07-21 MED ORDER — PROPOFOL 10 MG/ML IV BOLUS
INTRAVENOUS | Status: AC
  Filled 2020-07-21: qty 20

## 2020-07-21 MED ORDER — DEXAMETHASONE SODIUM PHOSPHATE 10 MG/ML IJ SOLN
INTRAMUSCULAR | Status: AC
  Filled 2020-07-21: qty 1

## 2020-07-21 MED ORDER — FENTANYL CITRATE (PF) 100 MCG/2ML IJ SOLN: 25.0000 ug | INTRAMUSCULAR | Status: DC | PRN

## 2020-07-21 MED ORDER — CHLORHEXIDINE GLUCONATE 0.12 % MT SOLN
OROMUCOSAL | Status: AC
  Administered 2020-07-21: 15 mL via OROMUCOSAL
  Filled 2020-07-21: qty 15

## 2020-07-21 MED ORDER — CHLORHEXIDINE GLUCONATE 0.12 % MT SOLN: 15.0000 mL | Freq: Once | OROMUCOSAL | Status: AC

## 2020-07-21 MED ORDER — ONDANSETRON HCL 4 MG/2ML IJ SOLN: 4.0000 mg | Freq: Once | INTRAMUSCULAR | Status: DC | PRN

## 2020-07-21 MED ORDER — LIDOCAINE 2% (20 MG/ML) 5 ML SYRINGE
INTRAMUSCULAR | Status: AC
  Filled 2020-07-21: qty 5

## 2020-07-21 MED ORDER — OXYCODONE HCL 5 MG PO TABS
ORAL_TABLET | ORAL | Status: AC
  Filled 2020-07-21: qty 1

## 2020-07-21 MED ORDER — LACTATED RINGERS IV SOLN: INTRAVENOUS | Status: DC | PRN

## 2020-07-21 SURGICAL SUPPLY — 32 items
ADH SKN CLS APL DERMABOND .7 (GAUZE/BANDAGES/DRESSINGS) ×2
ARMBAND PINK RESTRICT EXTREMIT (MISCELLANEOUS) ×2 IMPLANT
CANISTER SUCT 3000ML PPV (MISCELLANEOUS) ×2 IMPLANT
CLIP VESOCCLUDE MED 24/CT (CLIP) IMPLANT
CLIP VESOCCLUDE MED 6/CT (CLIP) ×2 IMPLANT
CLIP VESOCCLUDE SM WIDE 24/CT (CLIP) IMPLANT
CLIP VESOCCLUDE SM WIDE 6/CT (CLIP) ×2 IMPLANT
COVER PROBE W GEL 5X96 (DRAPES) ×2 IMPLANT
COVER WAND RF STERILE (DRAPES) ×1 IMPLANT
DERMABOND ADVANCED (GAUZE/BANDAGES/DRESSINGS) ×2
DERMABOND ADVANCED .7 DNX12 (GAUZE/BANDAGES/DRESSINGS) ×1 IMPLANT
ELECT REM PT RETURN 9FT ADLT (ELECTROSURGICAL) ×2
ELECTRODE REM PT RTRN 9FT ADLT (ELECTROSURGICAL) ×1 IMPLANT
GLOVE BIO SURGEON STRL SZ7.5 (GLOVE) ×2 IMPLANT
GOWN STRL REUS W/ TWL LRG LVL3 (GOWN DISPOSABLE) ×2 IMPLANT
GOWN STRL REUS W/ TWL XL LVL3 (GOWN DISPOSABLE) ×1 IMPLANT
GOWN STRL REUS W/TWL LRG LVL3 (GOWN DISPOSABLE) ×4
GOWN STRL REUS W/TWL XL LVL3 (GOWN DISPOSABLE) ×2
KIT BASIN OR (CUSTOM PROCEDURE TRAY) ×2 IMPLANT
KIT TURNOVER KIT B (KITS) ×2 IMPLANT
NS IRRIG 1000ML POUR BTL (IV SOLUTION) ×2 IMPLANT
PACK CV ACCESS (CUSTOM PROCEDURE TRAY) ×2 IMPLANT
PAD ARMBOARD 7.5X6 YLW CONV (MISCELLANEOUS) ×4 IMPLANT
SUT MNCRL AB 4-0 PS2 18 (SUTURE) ×2 IMPLANT
SUT PROLENE 6 0 BV (SUTURE) ×6 IMPLANT
SUT SILK 0 TIES 10X30 (SUTURE) ×2 IMPLANT
SUT SILK 2 0 SH (SUTURE) ×1 IMPLANT
SUT VIC AB 3-0 SH 27 (SUTURE) ×4
SUT VIC AB 3-0 SH 27X BRD (SUTURE) ×1 IMPLANT
TOWEL GREEN STERILE (TOWEL DISPOSABLE) ×2 IMPLANT
UNDERPAD 30X36 HEAVY ABSORB (UNDERPADS AND DIAPERS) ×2 IMPLANT
WATER STERILE IRR 1000ML POUR (IV SOLUTION) ×2 IMPLANT

## 2020-07-21 NOTE — Op Note (Signed)
    Patient name: [name] MRN: [id number] DOB: July 03, 1956 Sex: male  07/21/2020 Pre-operative Diagnosis: Chronic kidney disease Post-operative diagnosis:  Same Surgeon:  Eda Paschal. Donzetta Matters, MD Procedure Performed:  Superficialization and branch ligation left arm cephalic vein fistula  Indications: 64 year old male with chronic kidney disease.  He has a previous cephalic vein fistula that is too deep for use also has multiple branches he is indicated for the above-noted procedure.  Findings: There was a seroma at the previous procedure site and this was drained.  Fistula was approximately 1 cm deep throughout the upper arm.  There were multiple branches that were ligated between clips and ties.  Completion there was a very strong thrill in the fistula.   Procedure:  The patient was identified in the holding the operating room where is placed supine on operative 1 LMA anesthesia was induced.  He was sterilely prepped and draped in the left upper extremity usual fashion antibiotics were administered and a timeout was called.  We began using ultrasound identified multiple branches of the upper arm.  2 longitudinal incisions were made.  We dissected down to the fistula itself.  We divided branches between clips and ties.  We superficial lysed the entirety of the fistula drained the seroma from the previous incision site.  We irrigated the wound.  We closed the deep tissue with interrupted Vicryl suture.  We then closed for Monocryl overlying the fistula itself.  Dermabond placed the level of skin.  He was awakened from anesthesia having tolerated procedure without any complication.  All counts were correct at completion.  EBL: 50 cc   Demarion Pondexter C. Donzetta Matters, MD [facility] Office: [phone] Pager: [phone]

## 2020-07-21 NOTE — Anesthesia Postprocedure Evaluation (Signed)
Anesthesia Post Note  Patient: [name]  Procedure(s) Performed: LEFT UPPER ARM ATERIOVENOUS SUPERFISTULALIZATION (Left Arm Upper) LIGATION OF COMPETING BRANCHES OF LEFT UPPER ARM ARTERIOVENOUS FISTULA (Left Arm Upper)     Anesthesia Type: MAC Anesthetic complications: no   No complications documented.  Last Vitals:  Vitals:   07/21/20 0900 07/21/20 0915  BP: (!) 133/50 (!) 126/52  Pulse: 65 71  Resp: 16 16  Temp:  36.6 C  SpO2: 95% 94%    Last Pain:  Vitals:   07/21/20 0907  TempSrc:   PainSc: 3                  Denson Niccoli

## 2020-07-21 NOTE — Anesthesia Procedure Notes (Signed)
Procedure Name: LMA Insertion Performed by: Margrette Wynia H, CRNA Pre-anesthesia Checklist: Patient identified, Emergency Drugs available, Suction available and Patient being monitored Patient Re-evaluated:Patient Re-evaluated prior to induction Oxygen Delivery Method: Circle System Utilized Preoxygenation: Pre-oxygenation with 100% oxygen Induction Type: IV induction Ventilation: Mask ventilation without difficulty LMA: LMA inserted LMA Size: 5.0 Number of attempts: 1 Airway Equipment and Method: Bite block Placement Confirmation: positive ETCO2 Tube secured with: Tape Dental Injury: Teeth and Oropharynx as per pre-operative assessment        

## 2020-07-21 NOTE — Transfer of Care (Signed)
Immediate Anesthesia Transfer of Care Note  Patient: [name]  Procedure(s) Performed: LEFT UPPER ARM ATERIOVENOUS SUPERFISTULALIZATION (Left Arm Upper) LIGATION OF COMPETING BRANCHES OF LEFT UPPER ARM ARTERIOVENOUS FISTULA (Left Arm Upper)  Patient Location: PACU  Anesthesia Type:General  Level of Consciousness: drowsy  Airway & Oxygen Therapy: Patient Spontanous Breathing  Post-op Assessment: Report given to RN and Post -op Vital signs reviewed and stable  Post vital signs: Reviewed and stable  Last Vitals:  Vitals Value Taken Time  BP 137/56   Temp    Pulse 74 07/21/20 0844  Resp 16 07/21/20 0845  SpO2 96 % 07/21/20 0844  Vitals shown include unvalidated device data.  Last Pain:  Vitals:   07/21/20 0655  TempSrc:   PainSc: 0-No pain         Complications: No complications documented.

## 2020-07-21 NOTE — Discharge Instructions (Signed)
° °  [facility] ° °Discharge Instructions ° °AV Fistula or Graft Surgery for Dialysis Access ° °Please refer to the following instructions for your post-procedure care. Your surgeon or physician assistant will discuss any changes with you. ° °Activity ° °You may drive the day following your surgery, if you are comfortable and no longer taking prescription pain medication. Resume full activity as the soreness in your incision resolves. ° °Bathing/Showering ° °You may shower after you go home. Keep your incision dry for 48 hours. Do not soak in a bathtub, hot tub, or swim until the incision heals completely. You may not shower if you have a hemodialysis catheter. ° °Incision Care ° °Clean your incision with mild soap and water after 48 hours. Pat the area dry with a clean towel. You do not need a bandage unless otherwise instructed. Do not apply any ointments or creams to your incision. You may have skin glue on your incision. Do not peel it off. It will come off on its own in about one week. Your arm may swell a bit after surgery. To reduce swelling use pillows to elevate your arm so it is above your heart. Your doctor will tell you if you need to lightly wrap your arm with an ACE bandage. ° °Diet ° °Resume your normal diet. There are not special food restrictions following this procedure. In order to heal from your surgery, it is CRITICAL to get adequate nutrition. Your body requires vitamins, minerals, and protein. Vegetables are the best source of vitamins and minerals. Vegetables also provide the perfect balance of protein. Processed food has little nutritional value, so try to avoid this. ° °Medications ° °Resume taking all of your medications. If your incision is causing pain, you may take over-the counter pain relievers such as acetaminophen (Tylenol). If you were prescribed a stronger pain medication, please be aware these medications can cause nausea and constipation. Prevent  nausea by taking the medication with a snack or meal. Avoid constipation by drinking plenty of fluids and eating foods with high amount of fiber, such as fruits, vegetables, and grains. Do not take Tylenol if you are taking prescription pain medications. ° ° ° ° °Follow up °Your surgeon may want to see you in the office following your access surgery. If so, this will be arranged at the time of your surgery. ° °Please call us immediately for any of the following conditions: ° °Increased pain, redness, drainage (pus) from your incision site °Fever of 101 degrees or higher °Severe or worsening pain at your incision site °Hand pain or numbness. ° °Reduce your risk of vascular disease: ° °Stop smoking. If you would like help, call [facility] at 1-800-QUIT-NOW (1-800-784-8669) or [place] at [phone] ° °Manage your cholesterol °Maintain a desired weight °Control your diabetes °Keep your blood pressure down ° °Dialysis ° °It will take several weeks to several months for your new dialysis access to be ready for use. Your surgeon will determine when it is OK to use it. Your nephrologist will continue to direct your dialysis. You can continue to use your Permcath until your new access is ready for use. ° °If you have any questions, please call the office at [phone]. ° °

## 2020-07-21 NOTE — Interval H&P Note (Signed)
History and Physical Interval Note:  07/21/2020 7:20 AM  [name]  has presented today for surgery, with the diagnosis of ESRD.  The various methods of treatment have been discussed with the patient and family. After consideration of risks, benefits and other options for treatment, the patient has consented to branch ligation of left arm avf and transposition versus superficialization of left arm cephalic vein fistula as a surgical intervention.  The patient's history has been reviewed, patient examined, no change in status, stable for surgery.  I have reviewed the patient's chart and labs.  Questions were answered to the patient's satisfaction.     Servando Snare

## 2020-07-22 ENCOUNTER — Encounter (HOSPITAL_COMMUNITY): Payer: Self-pay | Admitting: Vascular Surgery

## 2020-07-22 LAB — POCT I-STAT, CHEM 8
BUN: 38 mg/dL — ABNORMAL HIGH (ref 8–23)
Calcium, Ion: 1.16 mmol/L (ref 1.15–1.40)
Chloride: 107 mmol/L (ref 98–111)
Creatinine, Ser: 4.9 mg/dL — ABNORMAL HIGH (ref 0.61–1.24)
Glucose, Bld: 163 mg/dL — ABNORMAL HIGH (ref 70–99)
HCT: 35 % — ABNORMAL LOW (ref 39.0–52.0)
Hemoglobin: 11.9 g/dL — ABNORMAL LOW (ref 13.0–17.0)
Potassium: 4 mmol/L (ref 3.5–5.1)
Sodium: 140 mmol/L (ref 135–145)
TCO2: 19 mmol/L — ABNORMAL LOW (ref 22–32)

## 2020-07-25 ENCOUNTER — Encounter (INDEPENDENT_AMBULATORY_CARE_PROVIDER_SITE_OTHER): Payer: Self-pay | Admitting: Ophthalmology

## 2020-07-25 ENCOUNTER — Other Ambulatory Visit: Payer: Self-pay

## 2020-07-25 ENCOUNTER — Ambulatory Visit (INDEPENDENT_AMBULATORY_CARE_PROVIDER_SITE_OTHER): Payer: 59 | Admitting: Ophthalmology

## 2020-07-25 DIAGNOSIS — E113412 Type 2 diabetes mellitus with severe nonproliferative diabetic retinopathy with macular edema, left eye: Secondary | ICD-10-CM

## 2020-07-25 DIAGNOSIS — E113411 Type 2 diabetes mellitus with severe nonproliferative diabetic retinopathy with macular edema, right eye: Secondary | ICD-10-CM

## 2020-07-25 NOTE — Assessment & Plan Note (Signed)
PRP #1 OD completed today superiorly and nasally

## 2020-07-25 NOTE — Progress Notes (Signed)
07/25/2020     CHIEF COMPLAINT Patient presents for Retina Follow Up   HISTORY OF PRESENT ILLNESS: [name] is a 64 y.o. male who presents to the clinic today for:   HPI    Retina Follow Up    Patient presents with  Diabetic Retinopathy.  In right eye.  This started 2 weeks ago.  Severity is mild.  Duration of 2 weeks.  Since onset it is stable.          Comments    2 WK PRP OD  Pt states vision stable, no new F/F, no pain.    Last BS: 160 this AM        Last edited by Nichola Sizer D on 07/25/2020  2:20 PM. (History)      Referring physician: [name], PA-C [place],  [place] [address]  HISTORICAL INFORMATION:   Selected notes from the MEDICAL RECORD NUMBER    Lab Results  Component Value Date   HGBA1C 5.7 (H) 09/16/2018     CURRENT MEDICATIONS: Current Outpatient Medications (Ophthalmic Drugs)  Medication Sig  . Polyvinyl Alcohol-Povidone PF (REFRESH) 1.4-0.6 % SOLN Place 1 drop into both eyes 2 (two) times daily as needed (dry eyes).    No current facility-administered medications for this visit. (Ophthalmic Drugs)   Current Outpatient Medications (Other)  Medication Sig  . amLODipine (NORVASC) 10 MG tablet Take 1 tablet (10 mg total) by mouth daily.  Marland Kitchen amoxicillin (AMOXIL) 250 MG chewable tablet Chew 250 mg by mouth 2 (two) times daily.  Marland Kitchen aspirin EC 81 MG tablet Take 81 mg by mouth daily.  Marland Kitchen atorvastatin (LIPITOR) 40 MG tablet Take 40 mg by mouth at bedtime.   Marland Kitchen BIDIL 20-37.5 MG tablet Take 1 tablet by mouth 3 (three) times daily.  . carvedilol (COREG) 25 MG tablet Take 1.5 tablets (37.5 mg total) by mouth 2 (two) times daily.  . clobetasol (TEMOVATE) 0.05 % external solution Apply 1 application topically daily as needed (dry scalp).  . clopidogrel (PLAVIX) 75 MG tablet Take 75 mg by mouth daily.   Marland Kitchen doxycycline (VIBRA-TABS) 100 MG tablet Take 1 tablet (100 mg total) by mouth 2 (two) times daily.  . fentaNYL (DURAGESIC -  DOSED MCG/HR) 50 MCG/HR Place 50 mcg onto the skin every 3 (three) days.  . ferrous gluconate (FERGON) 324 MG tablet Take 324 mg by mouth 2 (two) times daily.  . fluticasone (CUTIVATE) 0.05 % cream Apply 1 application topically 2 (two) times daily as needed (dry skin on face).   . folic acid (FOLVITE) 1 MG tablet Take 1 mg by mouth daily.  . Glucosamine HCl (GLUCOSAMINE PO) Take 1 tablet by mouth 2 (two) times daily.   Marland Kitchen HUMALOG KWIKPEN 100 UNIT/ML KiwkPen Inject 15-16 Units into the skin See admin instructions. Inject 13 units before breakfast, lunch, & dinner--  . insulin glargine (LANTUS) 100 UNIT/ML injection Inject 55 Units into the skin at bedtime.  Marland Kitchen ketoconazole (NIZORAL) 2 % cream Apply 1 application topically 2 (two) times daily as needed for irritation.   Marland Kitchen linaclotide (LINZESS) 145 MCG CAPS capsule Take 145 mcg by mouth daily as needed (constipation).   Marland Kitchen loratadine (CLARITIN) 10 MG tablet Take 10 mg by mouth daily.  . Multiple Vitamin (MULTIVITAMIN) tablet Take 1 tablet by mouth daily.  Marland Kitchen NEEDLE, REUSABLE, 22 G 22G X 1-1/2" MISC 1 Units by Does not apply route as directed. For B12 IM inj  . omega-3 acid  ethyl esters (LOVAZA) 1 g capsule Take 2 g by mouth 2 (two) times daily.  . ondansetron (ZOFRAN) 4 MG tablet Take 4 mg by mouth every 6 (six) hours as needed for nausea or vomiting.  Marland Kitchen oxyCODONE-acetaminophen (PERCOCET) 7.5-325 MG tablet Take 1 tablet by mouth every 8 (eight) hours.  . pantoprazole (PROTONIX) 40 MG tablet Take 40 mg by mouth daily.  . pregabalin (LYRICA) 100 MG capsule Take 1 capsule (100 mg total) by mouth 3 (three) times daily.  Marland Kitchen rOPINIRole (REQUIP) 2 MG tablet Take 2 mg by mouth at bedtime.   . Syringe/Needle, Disp, (SYRINGE 3CC/22GX1-1/2") 22G X 1-1/2" 3 ML MISC 1 Syringe by Does not apply route as directed. For b12 IM inj  . tamsulosin (FLOMAX) 0.4 MG CAPS capsule Take 0.4 mg by mouth daily.  Marland Kitchen torsemide (DEMADEX) 20 MG tablet Take 20 mg by mouth See admin  instructions. Take 20 mg daily, may take a second 20 mg dose before 1500 as needed for swelling   No current facility-administered medications for this visit. (Other)      REVIEW OF SYSTEMS:    ALLERGIES Allergies  Allergen Reactions  . Contrast Media [Iodinated Diagnostic Agents]     Difficulty breathing    . Ivp Dye [Iodinated Diagnostic Agents] Anaphylaxis and Other (See Comments)    Breathing problems, altered mental state   . Adhesive [Tape] Rash    Rash after 3-4 days use    PAST MEDICAL HISTORY Past Medical History:  Diagnosis Date  . Carotid artery occlusion 11/10/10   LEFT CAROTID ENDARTERECTOMY  . Chronic kidney disease   . Complication of anesthesia    BP WENT UP AT [facility] "  . COPD (chronic obstructive pulmonary disease) ([place])    pt denies this dx as of 06/01/20 - no inhaler   . Diabetes mellitus without complication ([place])   . Diverticulitis   . Diverticulosis of colon (without mention of hemorrhage)   . DJD (degenerative joint disease)    knees/hands/feet/back/neck  . Fatty liver   . Full dentures   . GERD (gastroesophageal reflux disease)   . H/O hiatal hernia   . History of blood transfusion    with a past surical procedure per patient 06/01/20  . Hyperlipidemia   . Hypertension   . Neuromuscular disorder ([place])    peripheral neuropathy  . Non-pressure chronic ulcer of other part of left foot limited to breakdown of skin ([place]) 11/12/2016  . Osteomyelitis ([place])    left 5th metatarsal  . PAD (peripheral artery disease) ([place])    Distal aortogram June 2012. Atherectomy left popliteal artery July 2012.   . Pseudoclaudication 11/15/2018  . Sleep apnea    pt denies this dx as of 06/01/20  . Slurred speech    AS PER WIFE IN D/C NOTE 11/10/10  . 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 Unstable angina ([place]) 09/16/2018  . Wears glasses    Past Surgical History:  Procedure Laterality Date  . AMPUTATION  11/05/2011   Procedure: AMPUTATION RAY;  Surgeon: Wylene Simmer, MD;   Location: [facility];  Service: Orthopedics;  Laterality: Right;  Amputation of Right 4&5th Toes  . AMPUTATION Left 11/26/2012   Procedure: AMPUTATION RAY;  Surgeon: Wylene Simmer, MD;  Location: [place];  Service: Orthopedics;  Laterality: Left;  fourth ray amputation  . AMPUTATION Right 08/27/2014   Procedure: Transmetatarsal Amputation;  Surgeon: Newt Minion, MD;  Location: [facility];  Service: Orthopedics;  Laterality: Right;  . AMPUTATION Right 01/14/2015  Procedure: AMPUTATION BELOW KNEE;  Surgeon: Newt Minion, MD;  Location: [facility];  Service: Orthopedics;  Laterality: Right;  . AMPUTATION Left 10/21/2015   Procedure: Left Foot 5th Ray Amputation;  Surgeon: Newt Minion, MD;  Location: [facility];  Service: Orthopedics;  Laterality: Left;  . ANTERIOR FUSION CERVICAL SPINE  02/06/06   C4-5, C5-6, C6-7; SURGEON DR. MAX COHEN  . AV FISTULA PLACEMENT Left 06/02/2020   Procedure: ARTERIOVENOUS (AV) FISTULA CREATION LEFT;  Surgeon: Waynetta Sandy, MD;  Location: [place];  Service: Vascular;  Laterality: Left;  . BACK SURGERY     x 3  . BASCILIC VEIN TRANSPOSITION Left 07/21/2020   Procedure: LEFT UPPER ARM ATERIOVENOUS SUPERFISTULALIZATION;  Surgeon: Waynetta Sandy, MD;  Location: [facility];  Service: Vascular;  Laterality: Left;  . BELOW KNEE LEG AMPUTATION Right   . CARDIAC CATHETERIZATION  10/31/04   2009  . CAROTID ENDARTERECTOMY  11/10/10  . CAROTID ENDARTERECTOMY Left 11/10/2010   Subtotal occlusion of left internal carotid artery with left hemispheric transient ischemic attacks.  . CAROTID STENT    . CARPAL TUNNEL RELEASE Right 10/21/2013   Procedure: RIGHT CARPAL TUNNEL RELEASE;  Surgeon: Wynonia Sours, MD;  Location: [facility];  Service: Orthopedics;  Laterality: Right;  . CHOLECYSTECTOMY    . COLON SURGERY    . COLONOSCOPY    . COLOSTOMY REVERSAL  05/21/2018   ileostomy reversal  . CYSTOSCOPY WITH STENT PLACEMENT Bilateral 01/13/2018   Procedure: CYSTOSCOPY WITH  BILATERAL URETERAL CATHETER PLACEMENT;  Surgeon: Ardis Hughs, MD;  Location: [facility];  Service: Urology;  Laterality: Bilateral;  . ESOPHAGEAL MANOMETRY Bilateral 07/19/2014   Procedure: ESOPHAGEAL MANOMETRY (EM);  Surgeon: Jerene Bears, MD;  [facility];  Service: Gastroenterology;  Laterality: Bilateral;  . EYE SURGERY Bilateral 2020   cataract  . FEMORAL ARTERY STENT     x6  . FINGER SURGERY    . FOOT SURGERY  04/25/2016    EXCISION BASE 5TH METATARSAL AND PARTIAL CUBOID LEFT FOOT  . HERNIA REPAIR     LEFT INGUINAL AND UMBILICAL REPAIRS  . HERNIA REPAIR    . I & D EXTREMITY Left 04/25/2016   Procedure: EXCISION BASE 5TH METATARSAL AND PARTIAL CUBOID LEFT FOOT;  Surgeon: Newt Minion, MD;  Location: [facility];  Service: Orthopedics;  Laterality: Left;  . ILEOSTOMY  01/13/2018   Procedure: ILEOSTOMY;  Surgeon: Clovis Riley, MD;  Location[facility];  Service: General;;  . ILEOSTOMY CLOSURE N/A 05/21/2018   Procedure: ILEOSTOMY REVERSAL ERAS PATHWAY;  Surgeon: Clovis Riley, MD;  Location: [facility];  Service: General;  Laterality: N/A;  . IR RADIOLOGIST EVAL & MGMT  11/19/2017  . IR RADIOLOGIST EVAL & MGMT  12/03/2017  . IR RADIOLOGIST EVAL & MGMT  12/18/2017  . JOINT REPLACEMENT Right 2001   Total knee  . LAMINECTOMY     X 3 LUMBAR AND X 2 CERVICAL SPINE OPERATIONS  . LAPAROSCOPIC CHOLECYSTECTOMY W/ CHOLANGIOGRAPHY  11/09/04   SURGEON DR. Luella Cook  . LEFT HEART CATH AND CORONARY ANGIOGRAPHY N/A 09/16/2018   Procedure: LEFT HEART CATH AND CORONARY ANGIOGRAPHY;  Surgeon: Nigel Mormon, MD;  Location: [facility];  Service: Cardiovascular;  Laterality: N/A;  . LEFT HEART CATHETERIZATION WITH CORONARY ANGIOGRAM N/A 10/29/2014   Procedure: LEFT HEART CATHETERIZATION WITH CORONARY ANGIOGRAM;  Surgeon: Laverda Page, MD;  [facility];  Service: Cardiovascular;  Laterality: N/A;  .  LIGATION OF COMPETING BRANCHES OF ARTERIOVENOUS FISTULA Left 07/21/2020     Procedure: LIGATION OF COMPETING BRANCHES OF LEFT UPPER ARM ARTERIOVENOUS FISTULA;  Surgeon: Waynetta Sandy, MD;  Location: [place];  Service: Vascular;  Laterality: Left;  . LOWER EXTREMITY ANGIOGRAM N/A 03/19/2012   Procedure: LOWER EXTREMITY ANGIOGRAM;  Surgeon: Burnell Blanks, MD;  Location[facility];  Service: Cardiovascular;  Laterality: N/A;  . NECK SURGERY    . PARTIAL COLECTOMY N/A 01/13/2018   Procedure: LAPAROSCOPIC ASSISTED   SIGMOID COLECTOMY ILEOSTOMY;  Surgeon: Clovis Riley, MD;  Location[facility];  Service: General;  Laterality: N/A;  . PENILE PROSTHESIS IMPLANT  08/14/05   INFRAPUBIC INSERTION OF INFLATABLE PENILE PROSTHESIS; SURGEON DR. Amalia Hailey  . PENILE PROSTHESIS IMPLANT    . PERCUTANEOUS CORONARY STENT INTERVENTION (PCI-S) Right 10/29/2014   Procedure: PERCUTANEOUS CORONARY STENT INTERVENTION (PCI-S);  Surgeon: Laverda Page, MD;  [facility];  Service: Cardiovascular;  Laterality: Right;  . SHOULDER ARTHROSCOPY    . SPINE SURGERY    . TOE AMPUTATION Left   . TONSILLECTOMY    . TOTAL KNEE ARTHROPLASTY  07/2002   RIGHT KNEE ; SURGEON  DR. [name] ALSO HAD ARTHROSCOPIC RIGHT KNEE IN  10/2001  . TOTAL KNEE ARTHROPLASTY    . ULNAR NERVE TRANSPOSITION Right 10/21/2013   Procedure: RIGHT ELBOW  ULNAR NERVE DECOMPRESSION;  Surgeon: Wynonia Sours, MD;  Location: [facility];  Service: Orthopedics;  Laterality: Right;    FAMILY HISTORY Family History  Problem Relation Age of Onset  . Heart disease Father        Before age 56-  CAD, BPG  . Diabetes Father        Amputation  . Cancer Father        PROSTATE  . Hyperlipidemia Father   . Hypertension Father   . Heart attack Father        Triple BPG  . Varicose Veins Father   . Colon cancer Brother   . Diabetes Brother   . Heart disease Brother 13       A-Fib. Before age 53  . Hyperlipidemia Brother   . Hypertension Brother   . Cancer Sister        Breast  . Hyperlipidemia  Sister   . Hypertension Sister   . Hypertension Son   . Arthritis Other        GRANDMOTHER  . Hypertension Other        OTHER FAMILY MEMBERS    SOCIAL HISTORY Social History   Tobacco Use  . Smoking status: Former Smoker    Packs/day: 2.00    Years: 35.00    Pack years: 70.00    Types: Cigarettes    Quit date: 10/28/2011    Years since quitting: 8.7  . Smokeless tobacco: Never Used  Vaping Use  . Vaping Use: Some days  . Substances: Nicotine  Substance Use Topics  . Alcohol use: Not Currently    Comment: "not in a long time"  . Drug use: Never         OPHTHALMIC EXAM:  Base Eye Exam    Visual Acuity (ETDRS)      Right Left   Dist [place] 20/60 20/20   Dist ph [place] 20/30 -1    Correction: Glasses       Tonometry (Tonopen, 2:16 PM)      Right Left   Pressure 14 15       Pupils  Pupils Dark Light Shape React APD   Right PERRL 3 2 Round Brisk None   Left PERRL 3 2 Round Brisk None       Visual Fields (Counting fingers)      Left Right    Full Full       Extraocular Movement      Right Left    Full Full       Neuro/Psych    Oriented x3: Yes       Dilation    Right eye: 1.0% Mydriacyl, 2.5% Phenylephrine @ 2:17 PM          IMAGING AND PROCEDURES  Imaging and Procedures for 07/25/20  Panretinal Photocoagulation - OD - Right Eye       Laser Information The duration in seconds was 0.02. The spot size was 390 microns. Laser power was 344. Total spots was 987.   Notes PRP applied superiorly and nasally OD                ASSESSMENT/PLAN:  Severe nonproliferative diabetic retinopathy of right eye, with macular edema, associated with type 2 diabetes mellitus ([place]) PRP #1 OD completed today superiorly and nasally      ICD-10-CM   1. Severe nonproliferative diabetic retinopathy of right eye, with macular edema, associated with type 2 diabetes mellitus ([facility])  Z61.0960 Panretinal Photocoagulation - OD - Right Eye    1.  Bilateral  CSME active requiring multiple injections once every 8 weeks OU, managed to treat peripherally severe nonproliferative diabetic retinopathy OU so as to decrease treatment burden  2.  PRP #1 OU now completed  3.  Follow-up OS next in 5 weeks for likely injection intravitreal Eylea     4.  follow-up OD in 6 to 7 weeks likely for injection Eylea  Ophthalmic Meds Ordered this visit:  No orders of the defined types were placed in this encounter.      Return in about 5 weeks (around 08/29/2020) for dilate, EYLEA OCT, OS.  There are no Patient Instructions on file for this visit.   Explained the diagnoses, plan, and follow up with the patient and they expressed understanding.  Patient expressed understanding of the importance of proper follow up care.   Clent Demark Victorio Creeden M.D. Diseases & Surgery of the Retina and Vitreous Retina & Diabetic [place] 07/25/20     Abbreviations: M myopia (nearsighted); A astigmatism; H hyperopia (farsighted); P presbyopia; Mrx spectacle prescription;  CTL contact lenses; OD right eye; OS left eye; OU both eyes  XT exotropia; ET esotropia; PEK punctate epithelial keratitis; PEE punctate epithelial erosions; DES dry eye syndrome; MGD meibomian gland dysfunction; ATs artificial tears; PFAT's preservative free artificial tears; [place] nuclear sclerotic cataract; PSC posterior subcapsular cataract; ERM epi-retinal membrane; PVD posterior vitreous detachment; RD retinal detachment; DM diabetes mellitus; DR diabetic retinopathy; NPDR non-proliferative diabetic retinopathy; PDR proliferative diabetic retinopathy; CSME clinically significant macular edema; DME diabetic macular edema; dbh dot blot hemorrhages; CWS cotton wool spot; POAG primary open angle glaucoma; C/D cup-to-disc ratio; HVF humphrey visual field; GVF goldmann visual field; OCT optical coherence tomography; IOP intraocular pressure; BRVO Branch retinal vein occlusion; CRVO central retinal vein occlusion; CRAO  central retinal artery occlusion; BRAO branch retinal artery occlusion; RT retinal tear; SB scleral buckle; PPV pars plana vitrectomy; VH Vitreous hemorrhage; PRP panretinal laser photocoagulation; IVK intravitreal kenalog; VMT vitreomacular traction; MH Macular hole;  NVD neovascularization of the disc; NVE neovascularization elsewhere; AREDS age related eye disease study; ARMD age related  macular degeneration; POAG primary open angle glaucoma; EBMD epithelial/anterior basement membrane dystrophy; ACIOL anterior chamber intraocular lens; IOL intraocular lens; PCIOL posterior chamber intraocular lens; Phaco/IOL phacoemulsification with intraocular lens placement; [place] photorefractive keratectomy; LASIK laser assisted in situ keratomileusis; HTN hypertension; DM diabetes mellitus; COPD chronic obstructive pulmonary disease

## 2020-07-25 NOTE — Assessment & Plan Note (Signed)
Now 2 weeks status post PRP #1 OS

## 2020-07-29 ENCOUNTER — Other Ambulatory Visit (HOSPITAL_COMMUNITY): Payer: Self-pay | Admitting: *Deleted

## 2020-08-01 ENCOUNTER — Encounter (HOSPITAL_COMMUNITY)
Admission: RE | Admit: 2020-08-01 | Discharge: 2020-08-01 | Disposition: A | Discharge status: Home / Self Care | Payer: 59 | Source: Ambulatory Visit | Attending: Nephrology | Admitting: Nephrology

## 2020-08-01 ENCOUNTER — Other Ambulatory Visit: Payer: Self-pay

## 2020-08-01 DIAGNOSIS — N189 Chronic kidney disease, unspecified: Secondary | ICD-10-CM | POA: Diagnosis not present

## 2020-08-01 LAB — FERRITIN: Ferritin: 436 ng/mL — ABNORMAL HIGH (ref 24–336)

## 2020-08-01 LAB — IRON AND TIBC
Iron: 65 ug/dL (ref 45–182)
Saturation Ratios: 32 % (ref 17.9–39.5)
TIBC: 200 ug/dL — ABNORMAL LOW (ref 250–450)
UIBC: 135 ug/dL

## 2020-08-01 LAB — POCT HEMOGLOBIN-HEMACUE: Hemoglobin: 11.1 g/dL — ABNORMAL LOW (ref 13.0–17.0)

## 2020-08-01 MED ORDER — EPOETIN ALFA-EPBX 10000 UNIT/ML IJ SOLN
INTRAMUSCULAR | Status: AC
  Filled 2020-08-01: qty 2

## 2020-08-01 MED ORDER — EPOETIN ALFA-EPBX 10000 UNIT/ML IJ SOLN
20000.0000 [IU] | INTRAMUSCULAR | Status: DC
  Administered 2020-08-01: 20000 [IU] via SUBCUTANEOUS

## 2020-08-11 ENCOUNTER — Ambulatory Visit (INDEPENDENT_AMBULATORY_CARE_PROVIDER_SITE_OTHER): Payer: 59 | Admitting: Physician Assistant

## 2020-08-11 ENCOUNTER — Encounter: Payer: Self-pay | Admitting: Physician Assistant

## 2020-08-11 DIAGNOSIS — L97221 Non-pressure chronic ulcer of left calf limited to breakdown of skin: Secondary | ICD-10-CM

## 2020-08-11 NOTE — Progress Notes (Signed)
Office Visit Note   Patient: [name]           Date of Birth: June 12, 1956           MRN: [id number] Visit Date: 08/11/2020              Requested by: [name], PA-C [address] [place],  [place] [address] PCP: [name], PA-C  Chief Complaint  Patient presents with  . Left Leg - Follow-up      HPI: Patient presents today for follow-up of his left leg swelling and ulcer.  He has been having weekly compression wraps by home health and is here for follow-up.  He also is requesting a refill of antibiotic.  Assessment & Plan: Visit Diagnoses: No diagnosis found.  Plan: We will continue with wraps for 4 more weeks.  Follow-up at that time  Follow-Up Instructions: No follow-ups on file.   Ortho Exam  Patient is alert, oriented, no adenopathy, well-dressed, normal affect, normal respiratory effort. Focused examination he does have some mild erythema no cellulitis he has an area of swelling that is improving no foul odor compartments are softening  Imaging: No results found. No images are attached to the encounter.  Labs: Lab Results  Component Value Date   HGBA1C 5.7 (H) 09/16/2018   HGBA1C 7.7 (H) 05/15/2018   HGBA1C 7.1 (H) 01/07/2018   ESRSEDRATE 35 (H) 09/03/2016   ESRSEDRATE 111 (H) 05/11/2016   ESRSEDRATE 98 (H) 05/04/2016   CRP 1.2 (H) 09/03/2016   CRP 4.7 (H) 05/11/2016   CRP 8.0 (H) 05/04/2016   REPTSTATUS 11/15/2017 FINAL 11/10/2017   GRAMSTAIN  11/04/2017    ABUNDANT WBC PRESENT, PREDOMINANTLY PMN MODERATE GRAM NEGATIVE RODS MODERATE GRAM POSITIVE COCCI Performed at [facility], [place] [address], [place], [place] [address]    CULT  11/10/2017    NO GROWTH 5 DAYS Performed at [facility] [address], [place], [place] [address]    LABORGA STAPHYLOCOCCUS AUREUS 09/26/2014     Lab Results  Component Value Date   ALBUMIN 3.7 (L) 05/30/2020   ALBUMIN 2.9 (L) 06/19/2019   ALBUMIN 3.5 05/15/2018   PREALBUMIN 19.0  05/04/2016   PREALBUMIN 25.0 12/16/2015    Lab Results  Component Value Date   MG 1.9 05/24/2018   MG 2.1 05/23/2018   MG 1.3 (L) 05/22/2018   No results found for: VD25OH  Lab Results  Component Value Date   PREALBUMIN 19.0 05/04/2016   PREALBUMIN 25.0 12/16/2015   CBC EXTENDED Latest Ref Rng & Units 08/01/2020 07/21/2020 07/18/2020  WBC 3.4 - 10.8 x10E3/uL - - -  RBC 4.14 - 5.80 x10E6/uL - - -  HGB 13.0 - 17.0 g/dL 11.1(L) 11.9(L) 11.3(L)  HCT 39 - 52 % - 35.0(L) -  PLT 150 - 450 x10E3/uL - - -  NEUTROABS 1.7 - 7.7 K/uL - - -  LYMPHSABS 0.7 - 4.0 K/uL - - -     There is no height or weight on file to calculate BMI.  Orders:  No orders of the defined types were placed in this encounter.  No orders of the defined types were placed in this encounter.    Procedures: No procedures performed  Clinical Data: No additional findings.  ROS:  All other systems negative, except as noted in the HPI. Review of Systems  Objective: Vital Signs: There were no vitals taken for this visit.  Specialty Comments:  No specialty comments available.  [place]  History: Patient Active Problem List   Diagnosis Date Noted  . Carotid stenosis 06/15/2020  . Dyslipidemia 06/15/2020  . Severe nonproliferative diabetic retinopathy of right eye, with macular edema, associated with type 2 diabetes mellitus ([place]) 01/18/2020  . Severe nonproliferative diabetic retinopathy of left eye, with macular edema, associated with type 2 diabetes mellitus ([place]) 01/04/2020  . Retinal hemorrhage of right eye 01/04/2020  . 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 Pseudoclaudication 11/15/2018  . Essential hypertension 09/18/2018  . S/P BKA (below knee amputation), right ([place]) 09/18/2018  . Amputation of toe of left foot ([place]) 09/18/2018  . Peripheral artery disease ([place]) 09/18/2018  . S/P carotid endarterectomy 09/18/2018  . OSA on CPAP 09/18/2018  . Chronic back pain 09/18/2018  . Status post reversal of  ileostomy 05/21/2018  . Normocytic anemia 02/14/2018  . Intra-abdominal abscess ([place]) 11/04/2017  . Chronic venous hypertension (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 ([place]) 12/11/2016  . Unilateral primary osteoarthritis, left knee 10/11/2016  . History of right below knee amputation ([place]) 07/12/2016  . Diabetic foot infection ([place]) 05/04/2016  . Type 2 diabetes mellitus with insulin therapy ([place]) 12/16/2015  . Amputated toe ([place]) 10/21/2015  . Leg edema, left 09/03/2015  . CAD (dz of distal, mid and proximal RCA with implantation of 3 overlapping drug-eluting stent,) 09/03/2015  . Chronic diastolic heart failure, NYHA class 2 ([place]) 09/03/2015  . Angina pectoris associated with type 2 diabetes mellitus ([place]) 10/28/2014  . Hyperlipidemia 08/25/2014  . Limb pain 03/20/2013  . DJD (degenerative joint disease) 09/25/2012  . Migraine 09/25/2012  . Neuropathy 09/25/2012  . Restless legs syndrome (RLS) 09/25/2012  . Chronic obstructive pulmonary disease, unspecified ([place]) 04/25/2012  . Unknown cause of morbidity or mortality 04/25/2012  . Chronic total occlusion of artery of the extremities ([place]) 04/08/2012  . Onychomycosis 02/01/2012  . Occlusion and stenosis of carotid artery without mention of cerebral infarction 06/12/2011  . GERD (gastroesophageal reflux disease) 05/08/2011  . Barrett's esophagus without dysplasia 05/08/2011  . Former tobacco use 02/01/2011   Past Medical History:  Diagnosis Date  . Carotid artery occlusion 11/10/10   LEFT CAROTID ENDARTERECTOMY  . Chronic kidney disease   . Complication of anesthesia    BP WENT UP AT [facility] "  . COPD (chronic obstructive pulmonary disease) ([place])    pt denies this dx as of 06/01/20 - no inhaler   . Diabetes mellitus without complication ([place])   . Diverticulitis   . Diverticulosis of colon (without mention of hemorrhage)   . DJD (degenerative joint disease)    knees/hands/feet/back/neck  . Fatty liver   .  Full dentures   . GERD (gastroesophageal reflux disease)   . H/O hiatal hernia   . History of blood transfusion    with a past surical procedure per patient 06/01/20  . Hyperlipidemia   . Hypertension   . Neuromuscular disorder ([place])    peripheral neuropathy  . Non-pressure chronic ulcer of other part of left foot limited to breakdown of skin ([place]) 11/12/2016  . Osteomyelitis ([place])    left 5th metatarsal  . PAD (peripheral artery disease) ([place])    Distal aortogram June 2012. Atherectomy left popliteal artery July 2012.   . Pseudoclaudication 11/15/2018  . Sleep apnea    pt denies this dx as of 06/01/20  . Slurred speech    AS PER WIFE IN D/C NOTE 11/10/10  . 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 Unstable angina ([place]) 09/16/2018  . Wears glasses  Family History  Problem Relation Age of Onset  . Heart disease Father        Before age 26-  CAD, BPG  . Diabetes Father        Amputation  . Cancer Father        PROSTATE  . Hyperlipidemia Father   . Hypertension Father   . Heart attack Father        Triple BPG  . Varicose Veins Father   . Colon cancer Brother   . Diabetes Brother   . Heart disease Brother 58       A-Fib. Before age 13  . Hyperlipidemia Brother   . Hypertension Brother   . Cancer Sister        Breast  . Hyperlipidemia Sister   . Hypertension Sister   . Hypertension Son   . Arthritis Other        GRANDMOTHER  . Hypertension Other        OTHER FAMILY MEMBERS    Past Surgical History:  Procedure Laterality Date  . AMPUTATION  11/05/2011   Procedure: AMPUTATION RAY;  Surgeon: Wylene Simmer, MD;  Location: [facility];  Service: Orthopedics;  Laterality: Right;  Amputation of Right 4&5th Toes  . AMPUTATION Left 11/26/2012   Procedure: AMPUTATION RAY;  Surgeon: Wylene Simmer, MD;  Location: [facility];  Service: Orthopedics;  Laterality: Left;  fourth ray amputation  . AMPUTATION Right 08/27/2014   Procedure: Transmetatarsal Amputation;  Surgeon: Newt Minion, MD;  Location: [facility];   Service: Orthopedics;  Laterality: Right;  . AMPUTATION Right 01/14/2015   Procedure: AMPUTATION BELOW KNEE;  Surgeon: Newt Minion, MD;  Location: [facility];  Service: Orthopedics;  Laterality: Right;  . AMPUTATION Left 10/21/2015   Procedure: Left Foot 5th Ray Amputation;  Surgeon: Newt Minion, MD;  Location: [facility];  Service: Orthopedics;  Laterality: Left;  . ANTERIOR FUSION CERVICAL SPINE  02/06/06   C4-5, C5-6, C6-7; SURGEON DR. MAX COHEN  . AV FISTULA PLACEMENT Left 06/02/2020   Procedure: ARTERIOVENOUS (AV) FISTULA CREATION LEFT;  Surgeon: Waynetta Sandy, MD;  Location: [facility];  Service: Vascular;  Laterality: Left;  . BACK SURGERY     x 3  . BASCILIC VEIN TRANSPOSITION Left 07/21/2020   Procedure: LEFT UPPER ARM ATERIOVENOUS SUPERFISTULALIZATION;  Surgeon: Waynetta Sandy, MD;  Location: [facility];  Service: Vascular;  Laterality: Left;  . BELOW KNEE LEG AMPUTATION Right   . CARDIAC CATHETERIZATION  10/31/04   2009  . CAROTID ENDARTERECTOMY  11/10/10  . CAROTID ENDARTERECTOMY Left 11/10/2010   Subtotal occlusion of left internal carotid artery with left hemispheric transient ischemic attacks.  . CAROTID STENT    . CARPAL TUNNEL RELEASE Right 10/21/2013   Procedure: RIGHT CARPAL TUNNEL RELEASE;  Surgeon: Wynonia Sours, MD;  Location: [facility];  Service: Orthopedics;  Laterality: Right;  . CHOLECYSTECTOMY    . COLON SURGERY    . COLONOSCOPY    . COLOSTOMY REVERSAL  05/21/2018   ileostomy reversal  . CYSTOSCOPY WITH STENT PLACEMENT Bilateral 01/13/2018   Procedure: CYSTOSCOPY WITH BILATERAL URETERAL CATHETER PLACEMENT;  Surgeon: Ardis Hughs, MD;  Location: [facility];  Service: Urology;  Laterality: Bilateral;  . ESOPHAGEAL MANOMETRY Bilateral 07/19/2014   Procedure: ESOPHAGEAL MANOMETRY (EM);  Surgeon: Jerene Bears, MD;  [facility];  Service: Gastroenterology;  Laterality: Bilateral;  . EYE SURGERY Bilateral 2020   cataract  . FEMORAL ARTERY  STENT  x6  . FINGER SURGERY    . FOOT SURGERY  04/25/2016    EXCISION BASE 5TH METATARSAL AND PARTIAL CUBOID LEFT FOOT  . HERNIA REPAIR     LEFT INGUINAL AND UMBILICAL REPAIRS  . HERNIA REPAIR    . I & D EXTREMITY Left 04/25/2016   Procedure: EXCISION BASE 5TH METATARSAL AND PARTIAL CUBOID LEFT FOOT;  Surgeon: Newt Minion, MD;  Location: [facility];  Service: Orthopedics;  Laterality: Left;  . ILEOSTOMY  01/13/2018   Procedure: ILEOSTOMY;  Surgeon: Clovis Riley, MD;  Location[facility];  Service: General;;  . ILEOSTOMY CLOSURE N/A 05/21/2018   Procedure: ILEOSTOMY REVERSAL ERAS PATHWAY;  Surgeon: Clovis Riley, MD;  Location: [facility];  Service: General;  Laterality: N/A;  . IR RADIOLOGIST EVAL & MGMT  11/19/2017  . IR RADIOLOGIST EVAL & MGMT  12/03/2017  . IR RADIOLOGIST EVAL & MGMT  12/18/2017  . JOINT REPLACEMENT Right 2001   Total knee  . LAMINECTOMY     X 3 LUMBAR AND X 2 CERVICAL SPINE OPERATIONS  . LAPAROSCOPIC CHOLECYSTECTOMY W/ CHOLANGIOGRAPHY  11/09/04   SURGEON DR. Luella Cook  . LEFT HEART CATH AND CORONARY ANGIOGRAPHY N/A 09/16/2018   Procedure: LEFT HEART CATH AND CORONARY ANGIOGRAPHY;  Surgeon: Nigel Mormon, MD;  Location: [facility];  Service: Cardiovascular;  Laterality: N/A;  . LEFT HEART CATHETERIZATION WITH CORONARY ANGIOGRAM N/A 10/29/2014   Procedure: LEFT HEART CATHETERIZATION WITH CORONARY ANGIOGRAM;  Surgeon: Laverda Page, MD;  Location[facility];  Service: Cardiovascular;  Laterality: N/A;  . LIGATION OF COMPETING BRANCHES OF ARTERIOVENOUS FISTULA Left 07/21/2020   Procedure: LIGATION OF COMPETING BRANCHES OF LEFT UPPER ARM ARTERIOVENOUS FISTULA;  Surgeon: Waynetta Sandy, MD;  Location: [facility];  Service: Vascular;  Laterality: Left;  . LOWER EXTREMITY ANGIOGRAM N/A 03/19/2012   Procedure: LOWER EXTREMITY ANGIOGRAM;  Surgeon: Burnell Blanks, MD;  Location: [facility];  Service: Cardiovascular;  Laterality: N/A;  . NECK  SURGERY    . PARTIAL COLECTOMY N/A 01/13/2018   Procedure: LAPAROSCOPIC ASSISTED   SIGMOID COLECTOMY ILEOSTOMY;  Surgeon: Clovis Riley, MD;  [facility];  Service: General;  Laterality: N/A;  . PENILE PROSTHESIS IMPLANT  08/14/05   INFRAPUBIC INSERTION OF INFLATABLE PENILE PROSTHESIS; SURGEON DR. Amalia Hailey  . PENILE PROSTHESIS IMPLANT    . PERCUTANEOUS CORONARY STENT INTERVENTION (PCI-S) Right 10/29/2014   Procedure: PERCUTANEOUS CORONARY STENT INTERVENTION (PCI-S);  Surgeon: Laverda Page, MD;  Location: [facility] LAB;  Service: Cardiovascular;  Laterality: Right;  . SHOULDER ARTHROSCOPY    . SPINE SURGERY    . TOE AMPUTATION Left   . TONSILLECTOMY    . TOTAL KNEE ARTHROPLASTY  07/2002   RIGHT KNEE ; SURGEON  DR. [name] ALSO HAD ARTHROSCOPIC RIGHT KNEE IN  10/2001  . TOTAL KNEE ARTHROPLASTY    . ULNAR NERVE TRANSPOSITION Right 10/21/2013   Procedure: RIGHT ELBOW  ULNAR NERVE DECOMPRESSION;  Surgeon: Wynonia Sours, MD;  Location: [facility];  Service: Orthopedics;  Laterality: Right;   Social History   Occupational History  . Occupation: Magazine features editor: UNEMPLOYED  Tobacco Use  . Smoking status: Former Smoker    Packs/day: 2.00    Years: 35.00    Pack years: 70.00    Types: Cigarettes    Quit date: 10/28/2011    Years since quitting: 8.7  . Smokeless tobacco: Never Used  Vaping Use  .  Vaping Use: Some days  . Substances: Nicotine  Substance and Sexual Activity  . Alcohol use: Not Currently    Comment: "not in a long time"  . Drug use: Never  . Sexual activity: Yes    Birth control/protection: Implant    Comment: penile implant

## 2020-08-12 ENCOUNTER — Telehealth: Payer: Self-pay | Admitting: Physician Assistant

## 2020-08-12 ENCOUNTER — Other Ambulatory Visit: Payer: Self-pay | Admitting: Physician Assistant

## 2020-08-12 ENCOUNTER — Other Ambulatory Visit: Payer: Self-pay

## 2020-08-12 ENCOUNTER — Telehealth: Payer: Self-pay

## 2020-08-12 DIAGNOSIS — I739 Peripheral vascular disease, unspecified: Secondary | ICD-10-CM

## 2020-08-12 DIAGNOSIS — I6529 Occlusion and stenosis of unspecified carotid artery: Secondary | ICD-10-CM

## 2020-08-12 MED ORDER — OXYCODONE-ACETAMINOPHEN 7.5-325 MG PO TABS: 1.0000 | ORAL_TABLET | Freq: Three times a day (TID) | ORAL | 0 refills | Status: DC

## 2020-08-12 MED ORDER — DOXYCYCLINE HYCLATE 100 MG PO TABS: 100.0000 mg | ORAL_TABLET | Freq: Two times a day (BID) | ORAL | 0 refills | Status: DC

## 2020-08-12 NOTE — Telephone Encounter (Signed)
Called and spoke with patient. Advised him that doxycycline has been sent to his pharmacy.

## 2020-08-12 NOTE — Telephone Encounter (Signed)
Pt is office yesterday wrapped leg requesting refill on oxycodone please advise.

## 2020-08-12 NOTE — Telephone Encounter (Signed)
Call patient let him know doxycycline was called in

## 2020-08-12 NOTE — Telephone Encounter (Signed)
done

## 2020-08-12 NOTE — Telephone Encounter (Signed)
Patient called again he stated wrong medication has been sent in he stated infection medication was supposed to be sent in he stated ,medication is doxycycline. [phone]

## 2020-08-12 NOTE — Telephone Encounter (Signed)
Patient called requesting a refill of doxycycline. Please send to pharmacy on file. Patient phone number is [phone].

## 2020-08-15 ENCOUNTER — Encounter (HOSPITAL_COMMUNITY)
Admission: RE | Admit: 2020-08-15 | Discharge: 2020-08-15 | Disposition: A | Discharge status: Home / Self Care | Payer: 59 | Source: Ambulatory Visit | Attending: Nephrology | Admitting: Nephrology

## 2020-08-15 ENCOUNTER — Other Ambulatory Visit: Payer: Self-pay

## 2020-08-15 VITALS — BP 138/55 | HR 58 | Temp 98.2°F | Resp 18

## 2020-08-15 DIAGNOSIS — D649 Anemia, unspecified: Secondary | ICD-10-CM | POA: Insufficient documentation

## 2020-08-15 LAB — POCT HEMOGLOBIN-HEMACUE: Hemoglobin: 10.8 g/dL — ABNORMAL LOW (ref 13.0–17.0)

## 2020-08-15 MED ORDER — EPOETIN ALFA-EPBX 10000 UNIT/ML IJ SOLN: 20000.0000 [IU] | INTRAMUSCULAR | Status: DC

## 2020-08-15 MED ORDER — EPOETIN ALFA-EPBX 10000 UNIT/ML IJ SOLN
INTRAMUSCULAR | Status: AC
  Administered 2020-08-15: 20000 [IU] via SUBCUTANEOUS
  Filled 2020-08-15: qty 2

## 2020-08-19 ENCOUNTER — Ambulatory Visit (INDEPENDENT_AMBULATORY_CARE_PROVIDER_SITE_OTHER): Payer: Self-pay | Admitting: Physician Assistant

## 2020-08-19 ENCOUNTER — Other Ambulatory Visit: Payer: Self-pay

## 2020-08-19 ENCOUNTER — Ambulatory Visit (INDEPENDENT_AMBULATORY_CARE_PROVIDER_SITE_OTHER)
Admission: RE | Admit: 2020-08-19 | Discharge: 2020-08-19 | Disposition: A | Discharge status: Home / Self Care | Payer: 59 | Source: Ambulatory Visit | Attending: Physician Assistant | Admitting: Physician Assistant

## 2020-08-19 ENCOUNTER — Ambulatory Visit (HOSPITAL_COMMUNITY)
Admission: RE | Admit: 2020-08-19 | Discharge: 2020-08-19 | Disposition: A | Discharge status: Home / Self Care | Payer: 59 | Source: Ambulatory Visit | Attending: Physician Assistant | Admitting: Physician Assistant

## 2020-08-19 VITALS — BP 135/63 | HR 57 | Temp 98.6°F | Resp 20 | Ht 74.0 in | Wt 272.0 lb

## 2020-08-19 DIAGNOSIS — I739 Peripheral vascular disease, unspecified: Secondary | ICD-10-CM

## 2020-08-19 DIAGNOSIS — I6529 Occlusion and stenosis of unspecified carotid artery: Secondary | ICD-10-CM | POA: Diagnosis not present

## 2020-08-19 DIAGNOSIS — N189 Chronic kidney disease, unspecified: Secondary | ICD-10-CM

## 2020-08-19 NOTE — Progress Notes (Signed)
POST OPERATIVE OFFICE NOTE    CC:  F/u for surgery  HPI:  This is a 64 y.o. male who is s/p superficialization and branch ligation of left arm cephalic vein fistula on 25/95/6387 by Dr. Donzetta Matters.   His original fistula creation was on 06/02/2020 by Dr. Donzetta Matters Prior to that operation, he was seen and scheduled for follow up carotid studies and ABI.    He has hx of  right BKA 2016 and several left toe amputations 2017 by [name] & hx of atherectomy of the left popliteal artery by Dr. Angelena Form on 03/21/2011.  He has hx of left CEA on 11/10/2010 by Dr. Kellie Simmering.   He has hx of DDD with hx of back surgery.  Pt returns today for follow up.  He denies any temporary blindness, speech difficulties, sudden weakness or numbness of any extremity or facial droop.    He states that he was wearing a brace on the left leg and it rubbed a hole in his leg just below his knee medially.  He is being followed by [name] for this and is getting weekly compression wraps.  Pt states it is healing.  He states that he was putting a sock on his left foot and it ripped one of his toenails off.  He states it was very bloody but it did stop.    He is not yet on dialysis.  Dr. Hollie Salk is his nephrologist.     Allergies  Allergen Reactions  . Contrast Media [Iodinated Diagnostic Agents]     Difficulty breathing    . Ivp Dye [Iodinated Diagnostic Agents] Anaphylaxis and Other (See Comments)    Breathing problems, altered mental state   . Adhesive [Tape] Rash    Rash after 3-4 days use    Current Outpatient Medications  Medication Sig Dispense Refill  . amLODipine (NORVASC) 10 MG tablet Take 1 tablet (10 mg total) by mouth daily. 90 tablet 1  . amoxicillin (AMOXIL) 250 MG chewable tablet Chew 250 mg by mouth 2 (two) times daily.    Marland Kitchen aspirin EC 81 MG tablet Take 81 mg by mouth daily.    Marland Kitchen atorvastatin (LIPITOR) 40 MG tablet Take 40 mg by mouth at bedtime.     Marland Kitchen BIDIL 20-37.5 MG tablet Take 1 tablet by mouth 3 (three)  times daily.    . carvedilol (COREG) 25 MG tablet Take 1.5 tablets (37.5 mg total) by mouth 2 (two) times daily. 270 tablet 2  . clobetasol (TEMOVATE) 0.05 % external solution Apply 1 application topically daily as needed (dry scalp).    . clopidogrel (PLAVIX) 75 MG tablet Take 75 mg by mouth daily.     Marland Kitchen doxycycline (VIBRA-TABS) 100 MG tablet Take 1 tablet (100 mg total) by mouth 2 (two) times daily. 60 tablet 0  . fentaNYL (DURAGESIC - DOSED MCG/HR) 50 MCG/HR Place 50 mcg onto the skin every 3 (three) days.    . ferrous gluconate (FERGON) 324 MG tablet Take 324 mg by mouth 2 (two) times daily.    . fluticasone (CUTIVATE) 0.05 % cream Apply 1 application topically 2 (two) times daily as needed (dry skin on face).     . folic acid (FOLVITE) 1 MG tablet Take 1 mg by mouth daily.    . Glucosamine HCl (GLUCOSAMINE PO) Take 1 tablet by mouth 2 (two) times daily.     Marland Kitchen HUMALOG KWIKPEN 100 UNIT/ML KiwkPen Inject 15-16 Units into the skin See admin instructions. Inject 13 units before  breakfast, lunch, & dinner--  0  . insulin glargine (LANTUS) 100 UNIT/ML injection Inject 55 Units into the skin at bedtime.    Marland Kitchen ketoconazole (NIZORAL) 2 % cream Apply 1 application topically 2 (two) times daily as needed for irritation.   1  . linaclotide (LINZESS) 145 MCG CAPS capsule Take 145 mcg by mouth daily as needed (constipation).     Marland Kitchen loratadine (CLARITIN) 10 MG tablet Take 10 mg by mouth daily.    . Multiple Vitamin (MULTIVITAMIN) tablet Take 1 tablet by mouth daily.    Marland Kitchen NEEDLE, REUSABLE, 22 G 22G X 1-1/2" MISC 1 Units by Does not apply route as directed. For B12 IM inj 10 each 0  . omega-3 acid ethyl esters (LOVAZA) 1 g capsule Take 2 g by mouth 2 (two) times daily.    . ondansetron (ZOFRAN) 4 MG tablet Take 4 mg by mouth every 6 (six) hours as needed for nausea or vomiting.    . pantoprazole (PROTONIX) 40 MG tablet Take 40 mg by mouth daily.    . Polyvinyl Alcohol-Povidone PF (REFRESH) 1.4-0.6 % SOLN Place  1 drop into both eyes 2 (two) times daily as needed (dry eyes).     . pregabalin (LYRICA) 100 MG capsule Take 1 capsule (100 mg total) by mouth 3 (three) times daily. 30 capsule 0  . rOPINIRole (REQUIP) 2 MG tablet Take 2 mg by mouth at bedtime.     . Syringe/Needle, Disp, (SYRINGE 3CC/22GX1-1/2") 22G X 1-1/2" 3 ML MISC 1 Syringe by Does not apply route as directed. For b12 IM inj 10 each 0  . tamsulosin (FLOMAX) 0.4 MG CAPS capsule Take 0.4 mg by mouth daily.    Marland Kitchen torsemide (DEMADEX) 20 MG tablet Take 20 mg by mouth See admin instructions. Take 20 mg daily, may take a second 20 mg dose before 1500 as needed for swelling     No current facility-administered medications for this visit.     ROS:  See HPI  Physical Exam:  Today's Vitals   08/19/20 1430  BP: 135/63  Pulse: (!) 57  Resp: 20  Temp: 98.6 F (37 C)  TempSrc: Temporal  SpO2: 98%  Weight: 272 lb (123.4 kg)  Height: 6\' 2"  (1.88 m)   Body mass index is 34.92 kg/m.   Incision:  Incisions on left upper arm are well healed.  Extremities:  + thrill in left arm fistula and is easily palpable.  He has a palpable left ulnar pulse and right radial pulse.  He has a prosthesis on the RLE and the LLE has a compression wrap in place.  Neuro: in tact; there are no carotid bruits present.   Non-Invasive studies: Carotid duplex on 08/19/2020: Right:  1-39% Left:  No evidence of stenosis in the left ICA  ABI on 08/19/2020: Right:  amputation Left:  Compression wrap not removed and test limited.  He does have a TBI of 0.58 with toe pressure of 98.  Previous carotid duplex 06/09/2019 -1-15% bilaterally  ABI/TBI 03/01/2017: Left:  1.07/0.73  Assessment/Plan:  This is a 64 y.o. male who is s/p: superficialization and branch ligation of left arm cephalic vein fistula on 97/35/3299 by Dr. Donzetta Matters.  His original fistula creation was on 06/02/2020 by Dr. Donzetta Matters.  He is also here for follow up surveillance studies.   CKD  -fistula has  excellent thrill and is easily palpable.  Fistula can be used September 10, 2020.  PAD -pt with hx of right BKA 2016 and  several left toe amputations 2017 by [name] & hx of atherectomy of left popliteal artery in 2012 by Dr. Angelena Form. -his ABI today was limited due to compression wrap.  He did have a toe pressure of 98.  He states the wound on his leg below his knee medially is almost healed and is being followed by [name].   -will f/u with ABI in a year-we will see him sooner if pt develops new non healing wounds or rest pain.  Pt is in agreement.   Carotid disease -hx left CEA 11/10/2010 by Dr. Kellie Simmering -right ICA stenosis is 1-39% and there is no evidence of re-stenosis on the right.  Will have him return in 2 years for carotid duplex.  Pt is in agreement.   -continue statin/asa   Leontine Locket, [facility] [phone]  Clinic MD:  Donzetta Matters

## 2020-08-29 ENCOUNTER — Ambulatory Visit (INDEPENDENT_AMBULATORY_CARE_PROVIDER_SITE_OTHER): Payer: 59 | Admitting: Ophthalmology

## 2020-08-29 ENCOUNTER — Other Ambulatory Visit: Payer: Self-pay

## 2020-08-29 ENCOUNTER — Encounter (INDEPENDENT_AMBULATORY_CARE_PROVIDER_SITE_OTHER): Payer: Self-pay | Admitting: Ophthalmology

## 2020-08-29 ENCOUNTER — Encounter (HOSPITAL_COMMUNITY)
Admission: RE | Admit: 2020-08-29 | Discharge: 2020-08-29 | Disposition: A | Discharge status: Home / Self Care | Payer: 59 | Source: Ambulatory Visit | Attending: Nephrology | Admitting: Nephrology

## 2020-08-29 VITALS — BP 132/68 | HR 58 | Temp 98.6°F | Resp 20

## 2020-08-29 DIAGNOSIS — E113412 Type 2 diabetes mellitus with severe nonproliferative diabetic retinopathy with macular edema, left eye: Secondary | ICD-10-CM | POA: Diagnosis not present

## 2020-08-29 DIAGNOSIS — I6529 Occlusion and stenosis of unspecified carotid artery: Secondary | ICD-10-CM

## 2020-08-29 DIAGNOSIS — D649 Anemia, unspecified: Secondary | ICD-10-CM | POA: Diagnosis not present

## 2020-08-29 LAB — POCT HEMOGLOBIN-HEMACUE: Hemoglobin: 11.5 g/dL — ABNORMAL LOW (ref 13.0–17.0)

## 2020-08-29 LAB — FERRITIN: Ferritin: 401 ng/mL — ABNORMAL HIGH (ref 24–336)

## 2020-08-29 LAB — IRON AND TIBC
Iron: 62 ug/dL (ref 45–182)
Saturation Ratios: 29 % (ref 17.9–39.5)
TIBC: 217 ug/dL — ABNORMAL LOW (ref 250–450)
UIBC: 155 ug/dL

## 2020-08-29 MED ORDER — EPOETIN ALFA-EPBX 10000 UNIT/ML IJ SOLN
INTRAMUSCULAR | Status: AC
  Filled 2020-08-29: qty 2

## 2020-08-29 MED ORDER — AFLIBERCEPT 2MG/0.05ML IZ SOLN FOR KALEIDOSCOPE
2.0000 mg | INTRAVITREAL | Status: AC | PRN
  Administered 2020-08-29: 2 mg via INTRAVITREAL

## 2020-08-29 MED ORDER — EPOETIN ALFA-EPBX 10000 UNIT/ML IJ SOLN
20000.0000 [IU] | INTRAMUSCULAR | Status: DC
  Administered 2020-08-29: 20000 [IU] via SUBCUTANEOUS

## 2020-08-29 NOTE — Progress Notes (Signed)
08/29/2020     CHIEF COMPLAINT Patient presents for Retina Follow Up (5 WK FU OS, POSS EYLEA OS////Pt reports vision good until mucous gets in OU, OS vision good otherwise, no new F/F, no pain or pressure. ///Last A1C: 6.6 taken 07/2020///Last BS: 170 this AM )   HISTORY OF PRESENT ILLNESS: [name] is a 64 y.o. male who presents to the clinic today for:   HPI    Retina Follow Up    Patient presents with  Diabetic Retinopathy.  In left eye.  This started 5 weeks ago.  Severity is mild.  Duration of 5 weeks.  Since onset it is stable. Additional comments: 5 WK FU OS, POSS EYLEA OS    Pt reports vision good until mucous gets in OU, OS vision good otherwise, no new F/F, no pain or pressure.    Last A1C: 6.6 taken 07/2020   Last BS: 170 this AM        Last edited by Nichola Sizer D on 08/29/2020  2:41 PM. (History)      Referring physician: [name], PA-C [place],  [place] [address]  HISTORICAL INFORMATION:   Selected notes from the MEDICAL RECORD NUMBER    Lab Results  Component Value Date   HGBA1C 5.7 (H) 09/16/2018     CURRENT MEDICATIONS: Current Outpatient Medications (Ophthalmic Drugs)  Medication Sig  . Polyvinyl Alcohol-Povidone PF (REFRESH) 1.4-0.6 % SOLN Place 1 drop into both eyes 2 (two) times daily as needed (dry eyes).    No current facility-administered medications for this visit. (Ophthalmic Drugs)   Current Outpatient Medications (Other)  Medication Sig  . amLODipine (NORVASC) 10 MG tablet Take 1 tablet (10 mg total) by mouth daily.  Marland Kitchen amoxicillin (AMOXIL) 250 MG chewable tablet Chew 250 mg by mouth 2 (two) times daily.  Marland Kitchen aspirin EC 81 MG tablet Take 81 mg by mouth daily.  Marland Kitchen atorvastatin (LIPITOR) 40 MG tablet Take 40 mg by mouth at bedtime.   Marland Kitchen BIDIL 20-37.5 MG tablet Take 1 tablet by mouth 3 (three) times daily.  . carvedilol (COREG) 25 MG tablet Take 1.5 tablets (37.5 mg total) by mouth 2 (two) times daily.  .  clobetasol (TEMOVATE) 0.05 % external solution Apply 1 application topically daily as needed (dry scalp).  . clopidogrel (PLAVIX) 75 MG tablet Take 75 mg by mouth daily.   Marland Kitchen doxycycline (VIBRA-TABS) 100 MG tablet Take 1 tablet (100 mg total) by mouth 2 (two) times daily.  . fentaNYL (DURAGESIC - DOSED MCG/HR) 50 MCG/HR Place 50 mcg onto the skin every 3 (three) days.  . ferrous gluconate (FERGON) 324 MG tablet Take 324 mg by mouth 2 (two) times daily.  . fluticasone (CUTIVATE) 0.05 % cream Apply 1 application topically 2 (two) times daily as needed (dry skin on face).   . folic acid (FOLVITE) 1 MG tablet Take 1 mg by mouth daily.  . Glucosamine HCl (GLUCOSAMINE PO) Take 1 tablet by mouth 2 (two) times daily.   Marland Kitchen HUMALOG KWIKPEN 100 UNIT/ML KiwkPen Inject 15-16 Units into the skin See admin instructions. Inject 13 units before breakfast, lunch, & dinner--  . insulin glargine (LANTUS) 100 UNIT/ML injection Inject 55 Units into the skin at bedtime.  Marland Kitchen ketoconazole (NIZORAL) 2 % cream Apply 1 application topically 2 (two) times daily as needed for irritation.   Marland Kitchen linaclotide (LINZESS) 145 MCG CAPS capsule Take 145 mcg by mouth daily as needed (constipation).   Marland Kitchen loratadine (CLARITIN)  10 MG tablet Take 10 mg by mouth daily.  . Multiple Vitamin (MULTIVITAMIN) tablet Take 1 tablet by mouth daily.  Marland Kitchen NEEDLE, REUSABLE, 22 G 22G X 1-1/2" MISC 1 Units by Does not apply route as directed. For B12 IM inj  . omega-3 acid ethyl esters (LOVAZA) 1 g capsule Take 2 g by mouth 2 (two) times daily.  . ondansetron (ZOFRAN) 4 MG tablet Take 4 mg by mouth every 6 (six) hours as needed for nausea or vomiting.  . pantoprazole (PROTONIX) 40 MG tablet Take 40 mg by mouth daily.  . pregabalin (LYRICA) 100 MG capsule Take 1 capsule (100 mg total) by mouth 3 (three) times daily.  Marland Kitchen rOPINIRole (REQUIP) 2 MG tablet Take 2 mg by mouth at bedtime.   . Syringe/Needle, Disp, (SYRINGE 3CC/22GX1-1/2") 22G X 1-1/2" 3 ML MISC 1 Syringe  by Does not apply route as directed. For b12 IM inj  . tamsulosin (FLOMAX) 0.4 MG CAPS capsule Take 0.4 mg by mouth daily.  Marland Kitchen torsemide (DEMADEX) 20 MG tablet Take 20 mg by mouth See admin instructions. Take 20 mg daily, may take a second 20 mg dose before 1500 as needed for swelling   No current facility-administered medications for this visit. (Other)   Facility-Administered Medications Ordered in Other Visits (Other)  Medication Route  . epoetin alfa-epbx (RETACRIT) 80998 UNIT/ML injection   . epoetin alfa-epbx (RETACRIT) injection 20,000 Units Subcutaneous      REVIEW OF SYSTEMS:    ALLERGIES Allergies  Allergen Reactions  . Contrast Media [Iodinated Diagnostic Agents]     Difficulty breathing    . Ivp Dye [Iodinated Diagnostic Agents] Anaphylaxis and Other (See Comments)    Breathing problems, altered mental state   . Adhesive [Tape] Rash    Rash after 3-4 days use    PAST MEDICAL HISTORY Past Medical History:  Diagnosis Date  . Carotid artery occlusion 11/10/10   LEFT CAROTID ENDARTERECTOMY  . Chronic kidney disease   . Complication of anesthesia    BP WENT UP AT [facility] "  . COPD (chronic obstructive pulmonary disease) ([place])    pt denies this dx as of 06/01/20 - no inhaler   . Diabetes mellitus without complication ([place])   . Diverticulitis   . Diverticulosis of colon (without mention of hemorrhage)   . DJD (degenerative joint disease)    knees/hands/feet/back/neck  . Fatty liver   . Full dentures   . GERD (gastroesophageal reflux disease)   . H/O hiatal hernia   . History of blood transfusion    with a past surical procedure per patient 06/01/20  . Hyperlipidemia   . Hypertension   . Neuromuscular disorder ([place])    peripheral neuropathy  . Non-pressure chronic ulcer of other part of left foot limited to breakdown of skin ([place]) 11/12/2016  . Osteomyelitis ([place])    left 5th metatarsal  . PAD (peripheral artery disease) ([place])    Distal aortogram June 2012.  Atherectomy left popliteal artery July 2012.   . Pseudoclaudication 11/15/2018  . Sleep apnea    pt denies this dx as of 06/01/20  . Slurred speech    AS PER WIFE IN D/C NOTE 11/10/10  . 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 Unstable angina ([place]) 09/16/2018  . Wears glasses    Past Surgical History:  Procedure Laterality Date  . AMPUTATION  11/05/2011   Procedure: AMPUTATION RAY;  Surgeon: Wylene Simmer, MD;  Location: [place];  Service: Orthopedics;  Laterality: Right;  Amputation of Right  4&5th Toes  . AMPUTATION Left 11/26/2012   Procedure: AMPUTATION RAY;  Surgeon: Wylene Simmer, MD;  Location: [place];  Service: Orthopedics;  Laterality: Left;  fourth ray amputation  . AMPUTATION Right 08/27/2014   Procedure: Transmetatarsal Amputation;  Surgeon: Newt Minion, MD;  Location: [place];  Service: Orthopedics;  Laterality: Right;  . AMPUTATION Right 01/14/2015   Procedure: AMPUTATION BELOW KNEE;  Surgeon: Newt Minion, MD;  Location: [place];  Service: Orthopedics;  Laterality: Right;  . AMPUTATION Left 10/21/2015   Procedure: Left Foot 5th Ray Amputation;  Surgeon: Newt Minion, MD;  Location: [place];  Service: Orthopedics;  Laterality: Left;  . ANTERIOR FUSION CERVICAL SPINE  02/06/06   C4-5, C5-6, C6-7; SURGEON DR. MAX COHEN  . AV FISTULA PLACEMENT Left 06/02/2020   Procedure: ARTERIOVENOUS (AV) FISTULA CREATION LEFT;  Surgeon: Waynetta Sandy, MD;  Location: [facility];  Service: Vascular;  Laterality: Left;  . BACK SURGERY     x 3  . BASCILIC VEIN TRANSPOSITION Left 07/21/2020   Procedure: LEFT UPPER ARM ATERIOVENOUS SUPERFISTULALIZATION;  Surgeon: Waynetta Sandy, MD;  Location: [place];  Service: Vascular;  Laterality: Left;  . BELOW KNEE LEG AMPUTATION Right   . CARDIAC CATHETERIZATION  10/31/04   2009  . CAROTID ENDARTERECTOMY  11/10/10  . CAROTID ENDARTERECTOMY Left 11/10/2010   Subtotal occlusion of left internal carotid artery with left hemispheric transient ischemic attacks.  . CAROTID  STENT    . CARPAL TUNNEL RELEASE Right 10/21/2013   Procedure: RIGHT CARPAL TUNNEL RELEASE;  Surgeon: Wynonia Sours, MD;  Location: [facility] [place];  Service: Orthopedics;  Laterality: Right;  . CHOLECYSTECTOMY    . COLON SURGERY    . COLONOSCOPY    . COLOSTOMY REVERSAL  05/21/2018   ileostomy reversal  . CYSTOSCOPY WITH STENT PLACEMENT Bilateral 01/13/2018   Procedure: CYSTOSCOPY WITH BILATERAL URETERAL CATHETER PLACEMENT;  Surgeon: Ardis Hughs, MD;  Location[facility];  Service: Urology;  Laterality: Bilateral;  . ESOPHAGEAL MANOMETRY Bilateral 07/19/2014   Procedure: ESOPHAGEAL MANOMETRY (EM);  Surgeon: Jerene Bears, MD;  [facility];  Service: Gastroenterology;  Laterality: Bilateral;  . EYE SURGERY Bilateral 2020   cataract  . FEMORAL ARTERY STENT     x6  . FINGER SURGERY    . FOOT SURGERY  04/25/2016    EXCISION BASE 5TH METATARSAL AND PARTIAL CUBOID LEFT FOOT  . HERNIA REPAIR     LEFT INGUINAL AND UMBILICAL REPAIRS  . HERNIA REPAIR    . I & D EXTREMITY Left 04/25/2016   Procedure: EXCISION BASE 5TH METATARSAL AND PARTIAL CUBOID LEFT FOOT;  Surgeon: Newt Minion, MD;  Location: [facility];  Service: Orthopedics;  Laterality: Left;  . ILEOSTOMY  01/13/2018   Procedure: ILEOSTOMY;  Surgeon: Clovis Riley, MD;  Location[facility];  Service: General;;  . ILEOSTOMY CLOSURE N/A 05/21/2018   Procedure: ILEOSTOMY REVERSAL ERAS PATHWAY;  Surgeon: Clovis Riley, MD;  Location: [facility];  Service: General;  Laterality: N/A;  . IR RADIOLOGIST EVAL & MGMT  11/19/2017  . IR RADIOLOGIST EVAL & MGMT  12/03/2017  . IR RADIOLOGIST EVAL & MGMT  12/18/2017  . JOINT REPLACEMENT Right 2001   Total knee  . LAMINECTOMY     X 3 LUMBAR AND X 2 CERVICAL SPINE OPERATIONS  . LAPAROSCOPIC CHOLECYSTECTOMY W/ CHOLANGIOGRAPHY  11/09/04   SURGEON DR. Luella [place]  . LEFT HEART CATH AND CORONARY ANGIOGRAPHY N/A 09/16/2018  Procedure: LEFT HEART CATH AND CORONARY ANGIOGRAPHY;  Surgeon:  Nigel Mormon, MD;  Location: [facility];  Service: Cardiovascular;  Laterality: N/A;  . LEFT HEART CATHETERIZATION WITH CORONARY ANGIOGRAM N/A 10/29/2014   Procedure: LEFT HEART CATHETERIZATION WITH CORONARY ANGIOGRAM;  Surgeon: Laverda Page, MD;  Location[facility] C[facility];  Service: Cardiovascular;  Laterality: N/A;  . LIGATION OF COMPETING BRANCHES OF ARTERIOVENOUS FISTULA Left 07/21/2020   Procedure: LIGATION OF COMPETING BRANCHES OF LEFT UPPER ARM ARTERIOVENOUS FISTULA;  Surgeon: Waynetta Sandy, MD;  Location: [place];  Service: Vascular;  Laterality: Left;  . LOWER EXTREMITY ANGIOGRAM N/A 03/19/2012   Procedure: LOWER EXTREMITY ANGIOGRAM;  Surgeon: Burnell Blanks, MD;  Location[facility];  Service: Cardiovascular;  Laterality: N/A;  . NECK SURGERY    . PARTIAL COLECTOMY N/A 01/13/2018   Procedure: LAPAROSCOPIC ASSISTED   SIGMOID COLECTOMY ILEOSTOMY;  Surgeon: Clovis Riley, MD;  [facility];  Service: General;  Laterality: N/A;  . PENILE PROSTHESIS IMPLANT  08/14/05   INFRAPUBIC INSERTION OF INFLATABLE PENILE PROSTHESIS; SURGEON DR. Amalia Hailey  . PENILE PROSTHESIS IMPLANT    . PERCUTANEOUS CORONARY STENT INTERVENTION (PCI-S) Right 10/29/2014   Procedure: PERCUTANEOUS CORONARY STENT INTERVENTION (PCI-S);  Surgeon: Laverda Page, MD;  [facility] C[facility] LAB;  Service: Cardiovascular;  Laterality: Right;  . SHOULDER ARTHROSCOPY    . SPINE SURGERY    . TOE AMPUTATION Left   . TONSILLECTOMY    . TOTAL KNEE ARTHROPLASTY  07/2002   RIGHT KNEE ; SURGEON  DR. [name] ALSO HAD ARTHROSCOPIC RIGHT KNEE IN  10/2001  . TOTAL KNEE ARTHROPLASTY    . ULNAR NERVE TRANSPOSITION Right 10/21/2013   Procedure: RIGHT ELBOW  ULNAR NERVE DECOMPRESSION;  Surgeon: Wynonia Sours, MD;  Location: [place];  Service: Orthopedics;  Laterality: Right;    FAMILY HISTORY Family History  Problem Relation Age of Onset  . Heart disease Father        Before age 71-   CAD, BPG  . Diabetes Father        Amputation  . Cancer Father        PROSTATE  . Hyperlipidemia Father   . Hypertension Father   . Heart attack Father        Triple BPG  . Varicose Veins Father   . Colon cancer Brother   . Diabetes Brother   . Heart disease Brother 27       A-Fib. Before age 36  . Hyperlipidemia Brother   . Hypertension Brother   . Cancer Sister        Breast  . Hyperlipidemia Sister   . Hypertension Sister   . Hypertension Son   . Arthritis Other        GRANDMOTHER  . Hypertension Other        OTHER FAMILY MEMBERS    SOCIAL HISTORY Social History   Tobacco Use  . Smoking status: Former Smoker    Packs/day: 2.00    Years: 35.00    Pack years: 70.00    Types: Cigarettes    Quit date: 10/28/2011    Years since quitting: 8.8  . Smokeless tobacco: Never Used  Vaping Use  . Vaping Use: Some days  . Substances: Nicotine  Substance Use Topics  . Alcohol use: Not Currently    Comment: "not in a long time"  . Drug use: Never         OPHTHALMIC EXAM:  Base Eye  Exam    Visual Acuity (Snellen - Linear)      Right Left   Dist [place] 20/100 -1 20/20 -2   Dist ph [place] 20/60 -1        Tonometry (Tonopen, 2:49 PM)      Right Left   Pressure 29 30       Tonometry #2 (Tonopen, 2:49 PM)      Right Left   Pressure 24 28       Pupils      Dark Light Shape React APD   Right 3 2 Round Brisk None   Left 3 2 Round Brisk None       Visual Fields (Counting fingers)      Left Right    Full Full       Extraocular Movement      Right Left    Full Full       Neuro/Psych    Oriented x3: Yes   Mood/Affect: Normal       Dilation    Left eye: 1.0% Mydriacyl, 2.5% Phenylephrine @ 2:49 PM        Slit Lamp and Fundus Exam    External Exam      Right Left   External Normal Normal       Slit Lamp Exam      Right Left   Lids/Lashes Normal Normal   Conjunctiva/Sclera White and quiet White and quiet   Cornea Clear Clear   Anterior Chamber Deep  and quiet Deep and quiet   Iris Round and reactive Round and reactive   Lens Posterior chamber intraocular lens Posterior chamber intraocular lens   Anterior Vitreous Normal Normal       Fundus Exam      Right Left   Posterior Vitreous  Posterior vitreous detachment   Disc  Normal   C/D Ratio  0.25   Macula  Clinically significant macular edema, Exudates, Microaneurysms   Vessels  NPDR-Severe   Periphery  Normal, good PRP peripheral nasal and inferior          IMAGING AND PROCEDURES  Imaging and Procedures for 08/29/20  Iron and TIBC     Component Value Flag Ref Range Units Status   Iron 62      45 - 182 ug/dL Final   TIBC 217      250 - 450 ug/dL Final   Saturation Ratios 29      17.9 - 39.5 % Final   UIBC 155       ug/dL Final   Comment:   Performed at [facility], [place] [address], [place], [place] [address]        Ferritin     Component Value Flag Ref Range Units Status   Ferritin 401      24 - 336 ng/mL Final   Comment:   Performed at [facility], [place] [address] [place], [place] [address]        Hemoglobin-hemacue, POC     Component Value Flag Ref Range Units Status   Hemoglobin 11.5      13.0 - 17.0 g/dL Final        OCT, Retina - OU - Both Eyes       Right Eye Quality was good. Scan locations included subfoveal. Central Foveal Thickness: 636. Progression has worsened. Findings include cystoid macular edema.   Left Eye Quality was good. Scan locations included subfoveal. Central Foveal Thickness: 285. Progression has improved. Findings  include normal foveal contour.   Notes OD with massive subfoveal serous detachment of the secondary to massive CSME worse at current 9-week interval.  OD will need follow-up next week likely injection Eylea  OS, improved overall And stable at current 7-week follow-up interval, repeat injection today and examination again left eye in 8-week       Intravitreal Injection, Pharmacologic Agent - OS - Left  Eye       Time Out 08/29/2020. 3:31 PM. Confirmed correct patient, procedure, site, and patient consented.   Anesthesia Topical anesthesia was used. Anesthetic medications included [place] 3.5%.   Procedure Preparation included Ofloxacin , Tobramycin 0.3%, 10% betadine to eyelids, 5% betadine to ocular surface. A 30 gauge needle was used.   Injection:  2 mg aflibercept Alfonse Flavors) SOLN   NDC: [id number], Lot: [id number]   Route: Intravitreal, Site: Left Eye, Waste: 0 mg  Post-op Post injection exam found visual acuity of at least counting fingers. The patient tolerated the procedure well. There were no complications. The patient received written and verbal post procedure care education. Post injection medications were not given.                 ASSESSMENT/PLAN:  No problem-specific Assessment & Plan notes found for this encounter.      ICD-10-CM   1. Severe nonproliferative diabetic retinopathy of left eye, with macular edema, associated with type 2 diabetes mellitus (HCC)  P50.9326 OCT, Retina - OU - Both Eyes    Intravitreal Injection, Pharmacologic Agent - OS - Left Eye    aflibercept (EYLEA) SOLN 2 mg    1.  OS much improved status post injection Eylea and also post peripheral PRP  2.  OD status post peripheral PRP yet CSME has recurred, thus confirming significant vegF release burden still present will repeat injection Eylea next week OD  3.  OS will extend planned follow-up interval from 7 to 8 weeks since CSME is well controlled  Ophthalmic Meds Ordered this visit:  Meds ordered this encounter  Medications  . aflibercept (EYLEA) SOLN 2 mg       Return in about 1 week (around 09/05/2020) for dilate, OD, EYLEA OCT.  There are no Patient Instructions on file for this visit.   Explained the diagnoses, plan, and follow up with the patient and they expressed understanding.  Patient expressed understanding of the importance of proper follow up care.   Clent Demark  Mujahid Jalomo M.D. Diseases & Surgery of the Retina and Vitreous Retina & Diabetic [place] 08/29/20     Abbreviations: M myopia (nearsighted); A astigmatism; H hyperopia (farsighted); P presbyopia; Mrx spectacle prescription;  CTL contact lenses; OD right eye; OS left eye; OU both eyes  XT exotropia; ET esotropia; PEK punctate epithelial keratitis; PEE punctate epithelial erosions; DES dry eye syndrome; MGD meibomian gland dysfunction; ATs artificial tears; PFAT's preservative free artificial tears; [place] nuclear sclerotic cataract; PSC posterior subcapsular cataract; ERM epi-retinal membrane; PVD posterior vitreous detachment; RD retinal detachment; DM diabetes mellitus; DR diabetic retinopathy; NPDR non-proliferative diabetic retinopathy; PDR proliferative diabetic retinopathy; CSME clinically significant macular edema; DME diabetic macular edema; dbh dot blot hemorrhages; CWS cotton wool spot; POAG primary open angle glaucoma; C/D cup-to-disc ratio; HVF humphrey visual field; GVF goldmann visual field; OCT optical coherence tomography; IOP intraocular pressure; BRVO Branch retinal vein occlusion; CRVO central retinal vein occlusion; CRAO central retinal artery occlusion; BRAO branch retinal artery occlusion; RT retinal tear; SB scleral buckle; PPV pars plana vitrectomy; VH Vitreous hemorrhage; PRP  panretinal laser photocoagulation; IVK intravitreal kenalog; VMT vitreomacular traction; MH Macular hole;  NVD neovascularization of the disc; NVE neovascularization elsewhere; AREDS age related eye disease study; ARMD age related macular degeneration; POAG primary open angle glaucoma; EBMD epithelial/anterior basement membrane dystrophy; ACIOL anterior chamber intraocular lens; IOL intraocular lens; PCIOL posterior chamber intraocular lens; Phaco/IOL phacoemulsification with intraocular lens placement; [place] photorefractive keratectomy; LASIK laser assisted in situ keratomileusis; HTN hypertension; DM diabetes  mellitus; COPD chronic obstructive pulmonary disease

## 2020-09-05 ENCOUNTER — Encounter (INDEPENDENT_AMBULATORY_CARE_PROVIDER_SITE_OTHER): Payer: 59 | Admitting: Ophthalmology

## 2020-09-08 ENCOUNTER — Ambulatory Visit (INDEPENDENT_AMBULATORY_CARE_PROVIDER_SITE_OTHER): Payer: 59 | Admitting: Physician Assistant

## 2020-09-08 ENCOUNTER — Encounter (INDEPENDENT_AMBULATORY_CARE_PROVIDER_SITE_OTHER): Payer: 59 | Admitting: Ophthalmology

## 2020-09-08 ENCOUNTER — Ambulatory Visit (INDEPENDENT_AMBULATORY_CARE_PROVIDER_SITE_OTHER): Payer: 59 | Admitting: Ophthalmology

## 2020-09-08 ENCOUNTER — Other Ambulatory Visit: Payer: Self-pay

## 2020-09-08 ENCOUNTER — Encounter: Payer: Self-pay | Admitting: Physician Assistant

## 2020-09-08 ENCOUNTER — Encounter (INDEPENDENT_AMBULATORY_CARE_PROVIDER_SITE_OTHER): Payer: Self-pay | Admitting: Ophthalmology

## 2020-09-08 DIAGNOSIS — E113411 Type 2 diabetes mellitus with severe nonproliferative diabetic retinopathy with macular edema, right eye: Secondary | ICD-10-CM

## 2020-09-08 DIAGNOSIS — L97221 Non-pressure chronic ulcer of left calf limited to breakdown of skin: Secondary | ICD-10-CM | POA: Diagnosis not present

## 2020-09-08 DIAGNOSIS — E113412 Type 2 diabetes mellitus with severe nonproliferative diabetic retinopathy with macular edema, left eye: Secondary | ICD-10-CM | POA: Diagnosis not present

## 2020-09-08 DIAGNOSIS — I6529 Occlusion and stenosis of unspecified carotid artery: Secondary | ICD-10-CM

## 2020-09-08 MED ORDER — AFLIBERCEPT 2MG/0.05ML IZ SOLN FOR KALEIDOSCOPE
2.0000 mg | INTRAVITREAL | Status: AC | PRN
  Administered 2020-09-08: 2 mg via INTRAVITREAL

## 2020-09-08 NOTE — Progress Notes (Signed)
Office Visit Note   Patient: [name]           Date of Birth: 1956-05-20           MRN: [id number] Visit Date: 09/08/2020              Requested by: [name], PA-C [address] [place],  [place] [address] PCP: [name], PA-C  Chief Complaint  Patient presents with  . Left Leg - Follow-up      HPI: This is a pleasant 64 year old gentleman who we have been following for left lower extremity swelling venous insufficiency.  He did have significant wounds on his leg because he had gotten significant swelling and his upright brace dug into his skin.  He has been having weekly wraps with home health and follows up today with [place].  He feels that the wraps have not been tight enough with home health so he has been applying an extra Ace wrap  Assessment & Plan: Visit Diagnoses: No diagnosis found.  Plan: We will wrap in a Profore dressing today.  He does have vive compression socks at home.  I have asked that he bring these with him next week and we will remove the wraps and try and put the socks on.  He will then need to work with [employer] to 6 q. or a better fit for his upright braces  Follow-Up Instructions: No follow-ups on file.   Ortho Exam  Patient is alert, oriented, no adenopathy, well-dressed, normal affect, normal respiratory effort. Examination significant decrease in swelling.  No open deep ulcers.  No foul odor.  He does have some maceration between his toe but nothing probes to bone and there is no ulcers.  No cellulitis or acute infection Imaging: Intravitreal Injection, Pharmacologic Agent - OD - Right Eye  Result Date: 09/08/2020 Time Out 09/08/2020. 2:40 PM. Confirmed correct patient, procedure, site, and patient consented. Anesthesia Topical anesthesia was used. Anesthetic medications included Akten 3.5%. Procedure Preparation included 5% betadine to ocular surface, 10% betadine to eyelids, Ofloxacin . A 30 gauge needle was used. Injection: 2 mg  aflibercept Alfonse Flavors) SOLN   NDC: A3590391, Lot: [id number]   Route: Intravitreal, Site: Right Eye, Waste: 0 mg Post-op Post injection exam found visual acuity of at least counting fingers. The patient tolerated the procedure well. There were no complications. The patient received written and verbal post procedure care education. Post injection medications were not given.   OCT, Retina - OU - Both Eyes  Result Date: 09/08/2020 Right Eye Quality was good. Scan locations included subfoveal. Central Foveal Thickness: 657. Progression has worsened. Left Eye Quality was good. Scan locations included subfoveal. Central Foveal Thickness: 277. Progression has improved. Notes OS with much less CSME on intravitreal injection antivegF, OD with worsening of CSME with serous subfoveal fluid as well, will need to deliver antivegF, Eylea today and follow-up in 3 weeks for likely PRP temporally to areas of nonperfusion  No images are attached to the encounter.  Labs: Lab Results  Component Value Date   HGBA1C 5.7 (H) 09/16/2018   HGBA1C 7.7 (H) 05/15/2018   HGBA1C 7.1 (H) 01/07/2018   ESRSEDRATE 35 (H) 09/03/2016   ESRSEDRATE 111 (H) 05/11/2016   ESRSEDRATE 98 (H) 05/04/2016   CRP 1.2 (H) 09/03/2016   CRP 4.7 (H) 05/11/2016   CRP 8.0 (H) 05/04/2016   REPTSTATUS 11/15/2017 FINAL 11/10/2017   GRAMSTAIN  11/04/2017    ABUNDANT WBC PRESENT, PREDOMINANTLY PMN MODERATE GRAM NEGATIVE  RODS MODERATE GRAM POSITIVE COCCI Performed at [facility], [place] [address], [place], [place] [address]    CULT  11/10/2017    NO GROWTH 5 DAYS Performed at [facility] [address] [place], [place] [address]    LABORGA STAPHYLOCOCCUS AUREUS 09/26/2014     Lab Results  Component Value Date   ALBUMIN 3.7 (L) 05/30/2020   ALBUMIN 2.9 (L) 06/19/2019   ALBUMIN 3.5 05/15/2018   PREALBUMIN 19.0 05/04/2016   PREALBUMIN 25.0 12/16/2015    Lab Results  Component Value Date   MG 1.9 05/24/2018   MG 2.1  05/23/2018   MG 1.3 (L) 05/22/2018   No results found for: VD25OH  Lab Results  Component Value Date   PREALBUMIN 19.0 05/04/2016   PREALBUMIN 25.0 12/16/2015   CBC EXTENDED Latest Ref Rng & Units 08/29/2020 08/15/2020 08/01/2020  WBC 3.4 - 10.8 x10E3/uL - - -  RBC 4.14 - 5.80 x10E6/uL - - -  HGB 13.0 - 17.0 g/dL 11.5(L) 10.8(L) 11.1(L)  HCT 39.0 - 52.0 % - - -  PLT 150 - 450 x10E3/uL - - -  NEUTROABS 1.7 - 7.7 K/uL - - -  LYMPHSABS 0.7 - 4.0 K/uL - - -     There is no height or weight on file to calculate BMI.  Orders:  No orders of the defined types were placed in this encounter.  No orders of the defined types were placed in this encounter.    Procedures: No procedures performed  Clinical Data: No additional findings.  ROS:  All other systems negative, except as noted in the HPI. Review of Systems  Objective: Vital Signs: There were no vitals taken for this visit.  Specialty Comments:  No specialty comments available.  PMFS History: Patient Active Problem List   Diagnosis Date Noted  . Carotid stenosis 06/15/2020  . Dyslipidemia 06/15/2020  . Severe nonproliferative diabetic retinopathy of right eye, with macular edema, associated with type 2 diabetes mellitus ([place]) 01/18/2020  . Severe nonproliferative diabetic retinopathy of left eye, with macular edema, associated with type 2 diabetes mellitus ([place]) 01/04/2020  . Retinal hemorrhage of right eye 01/04/2020  . 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 Pseudoclaudication 11/15/2018  . Essential hypertension 09/18/2018  . S/P BKA (below knee amputation), right ([place]) 09/18/2018  . Amputation of toe of left foot ([place]) 09/18/2018  . Peripheral artery disease ([place]) 09/18/2018  . S/P carotid endarterectomy 09/18/2018  . OSA on CPAP 09/18/2018  . Chronic back pain 09/18/2018  . Status post reversal of ileostomy 05/21/2018  . Normocytic anemia 02/14/2018  . Intra-abdominal abscess ([place]) 11/04/2017  . Chronic venous  hypertension (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 ([place]) 12/11/2016  . Unilateral primary osteoarthritis, left knee 10/11/2016  . History of right below knee amputation ([place]) 07/12/2016  . Diabetic foot infection ([place]) 05/04/2016  . Type 2 diabetes mellitus with insulin therapy ([place]) 12/16/2015  . Amputated toe ([place]) 10/21/2015  . Leg edema, left 09/03/2015  . CAD (dz of distal, mid and proximal RCA with implantation of 3 overlapping drug-eluting stent,) 09/03/2015  . Chronic diastolic heart failure, NYHA class 2 ([place]) 09/03/2015  . Angina pectoris associated with type 2 diabetes mellitus ([place]) 10/28/2014  . Hyperlipidemia 08/25/2014  . Limb pain 03/20/2013  . DJD (degenerative joint disease) 09/25/2012  . Migraine 09/25/2012  . Neuropathy 09/25/2012  . Restless legs syndrome (RLS) 09/25/2012  . Chronic obstructive pulmonary disease, unspecified ([place]) 04/25/2012  . Unknown cause  of morbidity or mortality 04/25/2012  . Chronic total occlusion of artery of the extremities ([place]) 04/08/2012  . Onychomycosis 02/01/2012  . Occlusion and stenosis of carotid artery without mention of cerebral infarction 06/12/2011  . GERD (gastroesophageal reflux disease) 05/08/2011  . Barrett's esophagus without dysplasia 05/08/2011  . Former tobacco use 02/01/2011   Past Medical History:  Diagnosis Date  . Carotid artery occlusion 11/10/10   LEFT CAROTID ENDARTERECTOMY  . Chronic kidney disease   . Complication of anesthesia    BP WENT UP AT [facility] "  . COPD (chronic obstructive pulmonary disease) ([place])    pt denies this dx as of 06/01/20 - no inhaler   . Diabetes mellitus without complication ([place])   . Diverticulitis   . Diverticulosis of colon (without mention of hemorrhage)   . DJD (degenerative joint disease)    knees/hands/feet/back/neck  . Fatty liver   . Full dentures   . GERD (gastroesophageal reflux disease)   . H/O hiatal hernia   . History of blood transfusion     with a past surical procedure per patient 06/01/20  . Hyperlipidemia   . Hypertension   . Neuromuscular disorder ([place])    peripheral neuropathy  . Non-pressure chronic ulcer of other part of left foot limited to breakdown of skin ([place]) 11/12/2016  . Osteomyelitis ([place])    left 5th metatarsal  . PAD (peripheral artery disease) ([place])    Distal aortogram June 2012. Atherectomy left popliteal artery July 2012.   . Pseudoclaudication 11/15/2018  . Sleep apnea    pt denies this dx as of 06/01/20  . Slurred speech    AS PER WIFE IN D/C NOTE 11/10/10  . 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 Unstable angina ([place]) 09/16/2018  . Wears glasses     Family History  Problem Relation Age of Onset  . Heart disease Father        Before age 77-  CAD, BPG  . Diabetes Father        Amputation  . Cancer Father        PROSTATE  . Hyperlipidemia Father   . Hypertension Father   . Heart attack Father        Triple BPG  . Varicose Veins Father   . Colon cancer Brother   . Diabetes Brother   . Heart disease Brother 18       A-Fib. Before age 20  . Hyperlipidemia Brother   . Hypertension Brother   . Cancer Sister        Breast  . Hyperlipidemia Sister   . Hypertension Sister   . Hypertension Son   . Arthritis Other        GRANDMOTHER  . Hypertension Other        OTHER FAMILY MEMBERS    Past Surgical History:  Procedure Laterality Date  . AMPUTATION  11/05/2011   Procedure: AMPUTATION RAY;  Surgeon: Wylene Simmer, MD;  Location: [facility];  Service: Orthopedics;  Laterality: Right;  Amputation of Right 4&5th Toes  . AMPUTATION Left 11/26/2012   Procedure: AMPUTATION RAY;  Surgeon: Wylene Simmer, MD;  Location: [facility];  Service: Orthopedics;  Laterality: Left;  fourth ray amputation  . AMPUTATION Right 08/27/2014   Procedure: Transmetatarsal Amputation;  Surgeon: Newt Minion, MD;  Location: [facility];  Service: Orthopedics;  Laterality: Right;  . AMPUTATION Right 01/14/2015   Procedure: AMPUTATION BELOW KNEE;  Surgeon:  Newt Minion, MD;  Location: [facility];  Service: Orthopedics;  Laterality:  Right;  . AMPUTATION Left 10/21/2015   Procedure: Left Foot 5th Ray Amputation;  Surgeon: Newt Minion, MD;  Location: [facility];  Service: Orthopedics;  Laterality: Left;  . ANTERIOR FUSION CERVICAL SPINE  02/06/06   C4-5, C5-6, C6-7; SURGEON DR. MAX COHEN  . AV FISTULA PLACEMENT Left 06/02/2020   Procedure: ARTERIOVENOUS (AV) FISTULA CREATION LEFT;  Surgeon: Waynetta Sandy, MD;  Location: [facility];  Service: Vascular;  Laterality: Left;  . BACK SURGERY     x 3  . BASCILIC VEIN TRANSPOSITION Left 07/21/2020   Procedure: LEFT UPPER ARM ATERIOVENOUS SUPERFISTULALIZATION;  Surgeon: Waynetta Sandy, MD;  Location: [facility];  Service: Vascular;  Laterality: Left;  . BELOW KNEE LEG AMPUTATION Right   . CARDIAC CATHETERIZATION  10/31/04   2009  . CAROTID ENDARTERECTOMY  11/10/10  . CAROTID ENDARTERECTOMY Left 11/10/2010   Subtotal occlusion of left internal carotid artery with left hemispheric transient ischemic attacks.  . CAROTID STENT    . CARPAL TUNNEL RELEASE Right 10/21/2013   Procedure: RIGHT CARPAL TUNNEL RELEASE;  Surgeon: Wynonia Sours, MD;  Location: [facility];  Service: Orthopedics;  Laterality: Right;  . CHOLECYSTECTOMY    . COLON SURGERY    . COLONOSCOPY    . COLOSTOMY REVERSAL  05/21/2018   ileostomy reversal  . CYSTOSCOPY WITH STENT PLACEMENT Bilateral 01/13/2018   Procedure: CYSTOSCOPY WITH BILATERAL URETERAL CATHETER PLACEMENT;  Surgeon: Ardis Hughs, MD;  Location: [facility];  Service: Urology;  Laterality: Bilateral;  . ESOPHAGEAL MANOMETRY Bilateral 07/19/2014   Procedure: ESOPHAGEAL MANOMETRY (EM);  Surgeon: Jerene Bears, MD;  Location[facility];  Service: Gastroenterology;  Laterality: Bilateral;  . EYE SURGERY Bilateral 2020   cataract  . FEMORAL ARTERY STENT     x6  . FINGER SURGERY    . FOOT SURGERY  04/25/2016    EXCISION BASE 5TH METATARSAL AND PARTIAL CUBOID LEFT  FOOT  . HERNIA REPAIR     LEFT INGUINAL AND UMBILICAL REPAIRS  . HERNIA REPAIR    . I & D EXTREMITY Left 04/25/2016   Procedure: EXCISION BASE 5TH METATARSAL AND PARTIAL CUBOID LEFT FOOT;  Surgeon: Newt Minion, MD;  Location: [facility];  Service: Orthopedics;  Laterality: Left;  . ILEOSTOMY  01/13/2018   Procedure: ILEOSTOMY;  Surgeon: Clovis Riley, MD;  Location[facility];  Service: General;;  . ILEOSTOMY CLOSURE N/A 05/21/2018   Procedure: ILEOSTOMY REVERSAL ERAS PATHWAY;  Surgeon: Clovis Riley, MD;  Location: [facility];  Service: General;  Laterality: N/A;  . IR RADIOLOGIST EVAL & MGMT  11/19/2017  . IR RADIOLOGIST EVAL & MGMT  12/03/2017  . IR RADIOLOGIST EVAL & MGMT  12/18/2017  . JOINT REPLACEMENT Right 2001   Total knee  . LAMINECTOMY     X 3 LUMBAR AND X 2 CERVICAL SPINE OPERATIONS  . LAPAROSCOPIC CHOLECYSTECTOMY W/ CHOLANGIOGRAPHY  11/09/04   SURGEON DR. Luella Cook  . LEFT HEART CATH AND CORONARY ANGIOGRAPHY N/A 09/16/2018   Procedure: LEFT HEART CATH AND CORONARY ANGIOGRAPHY;  Surgeon: Nigel Mormon, MD;  Location: [facility];  Service: Cardiovascular;  Laterality: N/A;  . LEFT HEART CATHETERIZATION WITH CORONARY ANGIOGRAM N/A 10/29/2014   Procedure: LEFT HEART CATHETERIZATION WITH CORONARY ANGIOGRAM;  Surgeon: Laverda Page, MD;  Location[facility];  Service: Cardiovascular;  Laterality: N/A;  . LIGATION OF COMPETING BRANCHES OF ARTERIOVENOUS FISTULA Left 07/21/2020   Procedure: LIGATION OF COMPETING BRANCHES OF LEFT UPPER  ARM ARTERIOVENOUS FISTULA;  Surgeon: Waynetta Sandy, MD;  Location: [facility];  Service: Vascular;  Laterality: Left;  . LOWER EXTREMITY ANGIOGRAM N/A 03/19/2012   Procedure: LOWER EXTREMITY ANGIOGRAM;  Surgeon: Burnell Blanks, MD;  Location: [facility] CATH LAB;  Service: Cardiovascular;  Laterality: N/A;  . NECK SURGERY    . PARTIAL COLECTOMY N/A 01/13/2018   Procedure: LAPAROSCOPIC ASSISTED   SIGMOID COLECTOMY ILEOSTOMY;  Surgeon:  Clovis Riley, MD;  Location[facility];  Service: General;  Laterality: N/A;  . PENILE PROSTHESIS IMPLANT  08/14/05   INFRAPUBIC INSERTION OF INFLATABLE PENILE PROSTHESIS; SURGEON DR. Amalia Hailey  . PENILE PROSTHESIS IMPLANT    . PERCUTANEOUS CORONARY STENT INTERVENTION (PCI-S) Right 10/29/2014   Procedure: PERCUTANEOUS CORONARY STENT INTERVENTION (PCI-S);  Surgeon: Laverda Page, MD;  Location: [facility]ATH LAB;  Service: Cardiovascular;  Laterality: Right;  . SHOULDER ARTHROSCOPY    . SPINE SURGERY    . TOE AMPUTATION Left   . TONSILLECTOMY    . TOTAL KNEE ARTHROPLASTY  07/2002   RIGHT KNEE ; SURGEON  DR. [name] ALSO HAD ARTHROSCOPIC RIGHT KNEE IN  10/2001  . TOTAL KNEE ARTHROPLASTY    . ULNAR NERVE TRANSPOSITION Right 10/21/2013   Procedure: RIGHT ELBOW  ULNAR NERVE DECOMPRESSION;  Surgeon: Wynonia Sours, MD;  Location: [facility];  Service: Orthopedics;  Laterality: Right;   Social History   Occupational History  . Occupation: Magazine features editor: UNEMPLOYED  Tobacco Use  . Smoking status: Former Smoker    Packs/day: 2.00    Years: 35.00    Pack years: 70.00    Types: Cigarettes    Quit date: 10/28/2011    Years since quitting: 8.8  . Smokeless tobacco: Never Used  Vaping Use  . Vaping Use: Some days  . Substances: Nicotine  Substance and Sexual Activity  . Alcohol use: Not Currently    Comment: "not in a long time"  . Drug use: Never  . Sexual activity: Yes    Birth control/protection: Implant    Comment: penile implant

## 2020-09-08 NOTE — Assessment & Plan Note (Signed)
OD with worsening of CSME with serous subfoveal fluid as well, will need to deliver antivegF, Eylea today and follow-up in 3 weeks for likely PRP temporally to areas of nonperfusion

## 2020-09-08 NOTE — Progress Notes (Signed)
09/08/2020     CHIEF COMPLAINT Patient presents for Retina Follow Up (6 WK FU OS, POSS EYLEA OD////Pt reports "gray haze" over vision in OD that started yesterday, some pressure OD, no new F/F////Last BS: 138 this AM )   HISTORY OF PRESENT ILLNESS: [name] is a 64 y.o. male who presents to the clinic today for:   HPI    Retina Follow Up    Patient presents with  Diabetic Retinopathy.  In right eye.  This started 6 weeks ago.  Severity is mild.  Duration of 6 weeks.  Since onset it is stable. Additional comments: 6 WK FU OS, POSS EYLEA OD    Pt reports "gray haze" over vision in OD that started yesterday, some pressure OD, no new F/F    Last BS: 138 this AM        Last edited by Nichola Sizer D on 09/08/2020  1:55 PM. (History)      Referring physician: [name], PA-C [place],  [place] [id number]  HISTORICAL INFORMATION:   Selected notes from the MEDICAL RECORD NUMBER    Lab Results  Component Value Date   HGBA1C 5.7 (H) 09/16/2018     CURRENT MEDICATIONS: Current Outpatient Medications (Ophthalmic Drugs)  Medication Sig  . Polyvinyl Alcohol-Povidone PF (REFRESH) 1.4-0.6 % SOLN Place 1 drop into both eyes 2 (two) times daily as needed (dry eyes).    No current facility-administered medications for this visit. (Ophthalmic Drugs)   Current Outpatient Medications (Other)  Medication Sig  . amLODipine (NORVASC) 10 MG tablet Take 1 tablet (10 mg total) by mouth daily.  Marland Kitchen amoxicillin (AMOXIL) 250 MG chewable tablet Chew 250 mg by mouth 2 (two) times daily.  Marland Kitchen aspirin EC 81 MG tablet Take 81 mg by mouth daily.  Marland Kitchen atorvastatin (LIPITOR) 40 MG tablet Take 40 mg by mouth at bedtime.   Marland Kitchen BIDIL 20-37.5 MG tablet Take 1 tablet by mouth 3 (three) times daily.  . carvedilol (COREG) 25 MG tablet Take 1.5 tablets (37.5 mg total) by mouth 2 (two) times daily.  . clobetasol (TEMOVATE) 0.05 % external solution Apply 1 application topically daily as  needed (dry scalp).  . clopidogrel (PLAVIX) 75 MG tablet Take 75 mg by mouth daily.   Marland Kitchen doxycycline (VIBRA-TABS) 100 MG tablet Take 1 tablet (100 mg total) by mouth 2 (two) times daily.  . fentaNYL (DURAGESIC - DOSED MCG/HR) 50 MCG/HR Place 50 mcg onto the skin every 3 (three) days.  . ferrous gluconate (FERGON) 324 MG tablet Take 324 mg by mouth 2 (two) times daily.  . fluticasone (CUTIVATE) 0.05 % cream Apply 1 application topically 2 (two) times daily as needed (dry skin on face).   . folic acid (FOLVITE) 1 MG tablet Take 1 mg by mouth daily.  . Glucosamine HCl (GLUCOSAMINE PO) Take 1 tablet by mouth 2 (two) times daily.   Marland Kitchen HUMALOG KWIKPEN 100 UNIT/ML KiwkPen Inject 15-16 Units into the skin See admin instructions. Inject 13 units before breakfast, lunch, & dinner--  . insulin glargine (LANTUS) 100 UNIT/ML injection Inject 55 Units into the skin at bedtime.  Marland Kitchen ketoconazole (NIZORAL) 2 % cream Apply 1 application topically 2 (two) times daily as needed for irritation.   Marland Kitchen linaclotide (LINZESS) 145 MCG CAPS capsule Take 145 mcg by mouth daily as needed (constipation).   Marland Kitchen loratadine (CLARITIN) 10 MG tablet Take 10 mg by mouth daily.  . Multiple Vitamin (MULTIVITAMIN) tablet Take 1 tablet  by mouth daily.  Marland Kitchen NEEDLE, REUSABLE, 22 G 22G X 1-1/2" MISC 1 Units by Does not apply route as directed. For B12 IM inj  . omega-3 acid ethyl esters (LOVAZA) 1 g capsule Take 2 g by mouth 2 (two) times daily.  . ondansetron (ZOFRAN) 4 MG tablet Take 4 mg by mouth every 6 (six) hours as needed for nausea or vomiting.  . pantoprazole (PROTONIX) 40 MG tablet Take 40 mg by mouth daily.  . pregabalin (LYRICA) 100 MG capsule Take 1 capsule (100 mg total) by mouth 3 (three) times daily.  Marland Kitchen rOPINIRole (REQUIP) 2 MG tablet Take 2 mg by mouth at bedtime.   . Syringe/Needle, Disp, (SYRINGE 3CC/22GX1-1/2") 22G X 1-1/2" 3 ML MISC 1 Syringe by Does not apply route as directed. For b12 IM inj  . tamsulosin (FLOMAX) 0.4 MG  CAPS capsule Take 0.4 mg by mouth daily.  Marland Kitchen torsemide (DEMADEX) 20 MG tablet Take 20 mg by mouth See admin instructions. Take 20 mg daily, may take a second 20 mg dose before 1500 as needed for swelling   No current facility-administered medications for this visit. (Other)      REVIEW OF SYSTEMS:    ALLERGIES Allergies  Allergen Reactions  . Contrast Media [Iodinated Diagnostic Agents]     Difficulty breathing    . Ivp Dye [Iodinated Diagnostic Agents] Anaphylaxis and Other (See Comments)    Breathing problems, altered mental state   . Adhesive [Tape] Rash    Rash after 3-4 days use    PAST MEDICAL HISTORY Past Medical History:  Diagnosis Date  . Carotid artery occlusion 11/10/10   LEFT CAROTID ENDARTERECTOMY  . Chronic kidney disease   . Complication of anesthesia    BP WENT UP AT [facility] "  . COPD (chronic obstructive pulmonary disease) ([place])    pt denies this dx as of 06/01/20 - no inhaler   . Diabetes mellitus without complication ([place])   . Diverticulitis   . Diverticulosis of colon (without mention of hemorrhage)   . DJD (degenerative joint disease)    knees/hands/feet/back/neck  . Fatty liver   . Full dentures   . GERD (gastroesophageal reflux disease)   . H/O hiatal hernia   . History of blood transfusion    with a past surical procedure per patient 06/01/20  . Hyperlipidemia   . Hypertension   . Neuromuscular disorder ([place])    peripheral neuropathy  . Non-pressure chronic ulcer of other part of left foot limited to breakdown of skin ([place]) 11/12/2016  . Osteomyelitis ([place])    left 5th metatarsal  . PAD (peripheral artery disease) ([place])    Distal aortogram June 2012. Atherectomy left popliteal artery July 2012.   . Pseudoclaudication 11/15/2018  . Sleep apnea    pt denies this dx as of 06/01/20  . Slurred speech    AS PER WIFE IN D/C NOTE 11/10/10  . 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 Unstable angina ([place]) 09/16/2018  . Wears glasses    Past Surgical History:   Procedure Laterality Date  . AMPUTATION  11/05/2011   Procedure: AMPUTATION RAY;  Surgeon: Wylene Simmer, MD;  Location: [facility];  Service: Orthopedics;  Laterality: Right;  Amputation of Right 4&5th Toes  . AMPUTATION Left 11/26/2012   Procedure: AMPUTATION RAY;  Surgeon: Wylene Simmer, MD;  Location: [facility];  Service: Orthopedics;  Laterality: Left;  fourth ray amputation  . AMPUTATION Right 08/27/2014   Procedure: Transmetatarsal Amputation;  Surgeon: Newt Minion, MD;  Location: [facility];  Service: Orthopedics;  Laterality: Right;  . AMPUTATION Right 01/14/2015   Procedure: AMPUTATION BELOW KNEE;  Surgeon: Newt Minion, MD;  Location: [facility];  Service: Orthopedics;  Laterality: Right;  . AMPUTATION Left 10/21/2015   Procedure: Left Foot 5th Ray Amputation;  Surgeon: Newt Minion, MD;  Location: [facility];  Service: Orthopedics;  Laterality: Left;  . ANTERIOR FUSION CERVICAL SPINE  02/06/06   C4-5, C5-6, C6-7; SURGEON DR. MAX COHEN  . AV FISTULA PLACEMENT Left 06/02/2020   Procedure: ARTERIOVENOUS (AV) FISTULA CREATION LEFT;  Surgeon: Waynetta Sandy, MD;  Location: [facility];  Service: Vascular;  Laterality: Left;  . BACK SURGERY     x 3  . BASCILIC VEIN TRANSPOSITION Left 07/21/2020   Procedure: LEFT UPPER ARM ATERIOVENOUS SUPERFISTULALIZATION;  Surgeon: Waynetta Sandy, MD;  Location: [facility];  Service: Vascular;  Laterality: Left;  . BELOW KNEE LEG AMPUTATION Right   . CARDIAC CATHETERIZATION  10/31/04   2009  . CAROTID ENDARTERECTOMY  11/10/10  . CAROTID ENDARTERECTOMY Left 11/10/2010   Subtotal occlusion of left internal carotid artery with left hemispheric transient ischemic attacks.  . CAROTID STENT    . CARPAL TUNNEL RELEASE Right 10/21/2013   Procedure: RIGHT CARPAL TUNNEL RELEASE;  Surgeon: Wynonia Sours, MD;  Location: [facility];  Service: Orthopedics;  Laterality: Right;  . CHOLECYSTECTOMY    . COLON SURGERY    . COLONOSCOPY    . COLOSTOMY REVERSAL  05/21/2018    ileostomy reversal  . CYSTOSCOPY WITH STENT PLACEMENT Bilateral 01/13/2018   Procedure: CYSTOSCOPY WITH BILATERAL URETERAL CATHETER PLACEMENT;  Surgeon: Ardis Hughs, MD;  [facility];  Service: Urology;  Laterality: Bilateral;  . ESOPHAGEAL MANOMETRY Bilateral 07/19/2014   Procedure: ESOPHAGEAL MANOMETRY (EM);  Surgeon: Jerene Bears, MD;  [facility];  Service: Gastroenterology;  Laterality: Bilateral;  . EYE SURGERY Bilateral 2020   cataract  . FEMORAL ARTERY STENT     x6  . FINGER SURGERY    . FOOT SURGERY  04/25/2016    EXCISION BASE 5TH METATARSAL AND PARTIAL CUBOID LEFT FOOT  . HERNIA REPAIR     LEFT INGUINAL AND UMBILICAL REPAIRS  . HERNIA REPAIR    . I & D EXTREMITY Left 04/25/2016   Procedure: EXCISION BASE 5TH METATARSAL AND PARTIAL CUBOID LEFT FOOT;  Surgeon: Newt Minion, MD;  Location: [facility];  Service: Orthopedics;  Laterality: Left;  . ILEOSTOMY  01/13/2018   Procedure: ILEOSTOMY;  Surgeon: Clovis Riley, MD;  [facility];  Service: General;;  . ILEOSTOMY CLOSURE N/A 05/21/2018   Procedure: ILEOSTOMY REVERSAL ERAS PATHWAY;  Surgeon: Clovis Riley, MD;  Location: [facility];  Service: General;  Laterality: N/A;  . IR RADIOLOGIST EVAL & MGMT  11/19/2017  . IR RADIOLOGIST EVAL & MGMT  12/03/2017  . IR RADIOLOGIST EVAL & MGMT  12/18/2017  . JOINT REPLACEMENT Right 2001   Total knee  . LAMINECTOMY     X 3 LUMBAR AND X 2 CERVICAL SPINE OPERATIONS  . LAPAROSCOPIC CHOLECYSTECTOMY W/ CHOLANGIOGRAPHY  11/09/04   SURGEON DR. Luella Cook  . LEFT HEART CATH AND CORONARY ANGIOGRAPHY N/A 09/16/2018   Procedure: LEFT HEART CATH AND CORONARY ANGIOGRAPHY;  Surgeon: Nigel Mormon, MD;  Location: [facility];  Service: Cardiovascular;  Laterality: N/A;  . LEFT HEART CATHETERIZATION WITH CORONARY ANGIOGRAM N/A 10/29/2014   Procedure: LEFT HEART CATHETERIZATION WITH CORONARY ANGIOGRAM;  Surgeon: Cammy Brochure  Carlynn Herald, MD;  Location: [facility];  Service:  Cardiovascular;  Laterality: N/A;  . LIGATION OF COMPETING BRANCHES OF ARTERIOVENOUS FISTULA Left 07/21/2020   Procedure: LIGATION OF COMPETING BRANCHES OF LEFT UPPER ARM ARTERIOVENOUS FISTULA;  Surgeon: Waynetta Sandy, MD;  Location: [facility];  Service: Vascular;  Laterality: Left;  . LOWER EXTREMITY ANGIOGRAM N/A 03/19/2012   Procedure: LOWER EXTREMITY ANGIOGRAM;  Surgeon: Burnell Blanks, MD;  Location: [facility];  Service: Cardiovascular;  Laterality: N/A;  . NECK SURGERY    . PARTIAL COLECTOMY N/A 01/13/2018   Procedure: LAPAROSCOPIC ASSISTED   SIGMOID COLECTOMY ILEOSTOMY;  Surgeon: Clovis Riley, MD;  [facility];  Service: General;  Laterality: N/A;  . PENILE PROSTHESIS IMPLANT  08/14/05   INFRAPUBIC INSERTION OF INFLATABLE PENILE PROSTHESIS; SURGEON DR. Amalia Hailey  . PENILE PROSTHESIS IMPLANT    . PERCUTANEOUS CORONARY STENT INTERVENTION (PCI-S) Right 10/29/2014   Procedure: PERCUTANEOUS CORONARY STENT INTERVENTION (PCI-S);  Surgeon: Laverda Page, MD;  Location: [facility];  Service: Cardiovascular;  Laterality: Right;  . SHOULDER ARTHROSCOPY    . SPINE SURGERY    . TOE AMPUTATION Left   . TONSILLECTOMY    . TOTAL KNEE ARTHROPLASTY  07/2002   RIGHT KNEE ; SURGEON  DR. [name] ALSO HAD ARTHROSCOPIC RIGHT KNEE IN  10/2001  . TOTAL KNEE ARTHROPLASTY    . ULNAR NERVE TRANSPOSITION Right 10/21/2013   Procedure: RIGHT ELBOW  ULNAR NERVE DECOMPRESSION;  Surgeon: Wynonia Sours, MD;  Location: [facility];  Service: Orthopedics;  Laterality: Right;    FAMILY HISTORY Family History  Problem Relation Age of Onset  . Heart disease Father        Before age 50-  CAD, BPG  . Diabetes Father        Amputation  . Cancer Father        PROSTATE  . Hyperlipidemia Father   . Hypertension Father   . Heart attack Father        Triple BPG  . Varicose Veins Father   . Colon cancer Brother   . Diabetes Brother   . Heart disease Brother 33       A-Fib. Before  age 71  . Hyperlipidemia Brother   . Hypertension Brother   . Cancer Sister        Breast  . Hyperlipidemia Sister   . Hypertension Sister   . Hypertension Son   . Arthritis Other        GRANDMOTHER  . Hypertension Other        OTHER FAMILY MEMBERS    SOCIAL HISTORY Social History   Tobacco Use  . Smoking status: Former Smoker    Packs/day: 2.00    Years: 35.00    Pack years: 70.00    Types: Cigarettes    Quit date: 10/28/2011    Years since quitting: 8.8  . Smokeless tobacco: Never Used  Vaping Use  . Vaping Use: Some days  . Substances: Nicotine  Substance Use Topics  . Alcohol use: Not Currently    Comment: "not in a long time"  . Drug use: Never         OPHTHALMIC EXAM: Base Eye Exam    Visual Acuity (ETDRS)      Right Left   Dist [place] 20/400 20/25 +2   Dist ph [place] NI        Tonometry (Tonopen, 1:57 PM)      Right Left   Pressure  10 11       Pupils      Pupils Dark Light Shape React APD   Right PERRL 3 2 Round Brisk None   Left PERRL 3 2 Round Brisk None       Visual Fields (Counting fingers)      Left Right    Full Full       Extraocular Movement      Right Left    Full Full       Neuro/Psych    Oriented x3: Yes   Mood/Affect: Normal       Dilation    Right eye: 1.0% Mydriacyl, 2.5% Phenylephrine @ 1:57 PM        Slit Lamp and Fundus Exam    External Exam      Right Left   External Normal Normal       Slit Lamp Exam      Right Left   Lids/Lashes Normal Normal   Conjunctiva/Sclera White and quiet White and quiet   Cornea Clear Clear   Anterior Chamber Deep and quiet Deep and quiet   Iris Round and reactive Round and reactive   Lens Posterior chamber intraocular lens Posterior chamber intraocular lens   Anterior Vitreous Normal Normal       Fundus Exam      Right Left   Posterior Vitreous Normal    Disc Normal    C/D Ratio 0.4    Macula Exudates, Microaneurysms, Mild clinically significant macular edema    Vessels NPDR  severe, with numerous dot blot hemorrhages, VCABS, Irma    Periphery Normal, good PRP nasal retina           IMAGING AND PROCEDURES  Imaging and Procedures for 09/08/20  OCT, Retina - OU - Both Eyes       Right Eye Quality was good. Scan locations included subfoveal. Central Foveal Thickness: 657. Progression has worsened.   Left Eye Quality was good. Scan locations included subfoveal. Central Foveal Thickness: 277. Progression has improved.   Notes OS with much less CSME on intravitreal injection antivegF,  OD with worsening of CSME with serous subfoveal fluid as well, will need to deliver antivegF, Eylea today and follow-up in 3 weeks for likely PRP temporally to areas of nonperfusion       Intravitreal Injection, Pharmacologic Agent - OD - Right Eye       Time Out 09/08/2020. 2:40 PM. Confirmed correct patient, procedure, site, and patient consented.   Anesthesia Topical anesthesia was used. Anesthetic medications included [place] 3.5%.   Procedure Preparation included 5% betadine to ocular surface, 10% betadine to eyelids, Ofloxacin . A 30 gauge needle was used.   Injection:  2 mg aflibercept Alfonse Flavors) SOLN   NDC: [id number], Lot: [id number]   Route: Intravitreal, Site: Right Eye, Waste: 0 mg  Post-op Post injection exam found visual acuity of at least counting fingers. The patient tolerated the procedure well. There were no complications. The patient received written and verbal post procedure care education. Post injection medications were not given.                 ASSESSMENT/PLAN:  Severe nonproliferative diabetic retinopathy of right eye, with macular edema, associated with type 2 diabetes mellitus (HCC) OD with worsening of CSME with serous subfoveal fluid as well, will need to deliver antivegF, Eylea today and follow-up in 3 weeks for likely PRP temporally to areas of nonperfusion  Severe nonproliferative diabetic retinopathy of left eye, with  macular edema, associated with type 2 diabetes mellitus ([place]) Much improved left eye, continue with current treatment plan      ICD-10-CM   1. Severe nonproliferative diabetic retinopathy of right eye, with macular edema, associated with type 2 diabetes mellitus (HCC)  E11.3411 OCT, Retina - OU - Both Eyes    Intravitreal Injection, Pharmacologic Agent - OD - Right Eye    aflibercept (EYLEA) SOLN 2 mg  2. Severe nonproliferative diabetic retinopathy of left eye, with macular edema, associated with type 2 diabetes mellitus ([place])  Q00.8676     1.  OS vastly improved on intravitreal Eylea.  2.  OD much worse with subfoveal serous fluid in addition to CSME.  This likely represents significant vegF burden inside the eye despite previous nasal PRP  Repeat intravitreal Eylea today, shorten interval follow-up on the right eye to 5 weeks yet in 3 weeks will deliver PRP to temporal retina with that which has widespread retinal nonperfusion  3.  Ophthalmic Meds Ordered this visit:  Meds ordered this encounter  Medications  . aflibercept (EYLEA) SOLN 2 mg       Return in about 3 weeks (around 09/29/2020) for dilate, OD, PRP, temporal retina.  There are no Patient Instructions on file for this visit.   Explained the diagnoses, plan, and follow up with the patient and they expressed understanding.  Patient expressed understanding of the importance of proper follow up care.   Clent Demark Nicholad Kautzman M.D. Diseases & Surgery of the Retina and Vitreous Retina & Diabetic [place] 09/08/20     Abbreviations: M myopia (nearsighted); A astigmatism; H hyperopia (farsighted); P presbyopia; Mrx spectacle prescription;  CTL contact lenses; OD right eye; OS left eye; OU both eyes  XT exotropia; ET esotropia; PEK punctate epithelial keratitis; PEE punctate epithelial erosions; DES dry eye syndrome; MGD meibomian gland dysfunction; ATs artificial tears; PFAT's preservative free artificial tears; [place] nuclear  sclerotic cataract; PSC posterior subcapsular cataract; ERM epi-retinal membrane; PVD posterior vitreous detachment; RD retinal detachment; DM diabetes mellitus; DR diabetic retinopathy; NPDR non-proliferative diabetic retinopathy; PDR proliferative diabetic retinopathy; CSME clinically significant macular edema; DME diabetic macular edema; dbh dot blot hemorrhages; CWS cotton wool spot; POAG primary open angle glaucoma; C/D cup-to-disc ratio; HVF humphrey visual field; GVF goldmann visual field; OCT optical coherence tomography; IOP intraocular pressure; BRVO Branch retinal vein occlusion; CRVO central retinal vein occlusion; CRAO central retinal artery occlusion; BRAO branch retinal artery occlusion; RT retinal tear; SB scleral buckle; PPV pars plana vitrectomy; VH Vitreous hemorrhage; PRP panretinal laser photocoagulation; IVK intravitreal kenalog; VMT vitreomacular traction; MH Macular hole;  NVD neovascularization of the disc; NVE neovascularization elsewhere; AREDS age related eye disease study; ARMD age related macular degeneration; POAG primary open angle glaucoma; EBMD epithelial/anterior basement membrane dystrophy; ACIOL anterior chamber intraocular lens; IOL intraocular lens; PCIOL posterior chamber intraocular lens; Phaco/IOL phacoemulsification with intraocular lens placement; [place] photorefractive keratectomy; LASIK laser assisted in situ keratomileusis; HTN hypertension; DM diabetes mellitus; COPD chronic obstructive pulmonary disease

## 2020-09-08 NOTE — Assessment & Plan Note (Signed)
Much improved left eye, continue with current treatment plan

## 2020-09-12 ENCOUNTER — Other Ambulatory Visit: Payer: Self-pay

## 2020-09-12 ENCOUNTER — Encounter (HOSPITAL_COMMUNITY)
Admission: RE | Admit: 2020-09-12 | Discharge: 2020-09-12 | Disposition: A | Discharge status: Home / Self Care | Payer: 59 | Source: Ambulatory Visit | Attending: Nephrology | Admitting: Nephrology

## 2020-09-12 DIAGNOSIS — D631 Anemia in chronic kidney disease: Secondary | ICD-10-CM | POA: Insufficient documentation

## 2020-09-12 DIAGNOSIS — D649 Anemia, unspecified: Secondary | ICD-10-CM | POA: Insufficient documentation

## 2020-09-12 DIAGNOSIS — N184 Chronic kidney disease, stage 4 (severe): Secondary | ICD-10-CM | POA: Insufficient documentation

## 2020-09-13 ENCOUNTER — Telehealth: Payer: Self-pay | Admitting: Orthopedic Surgery

## 2020-09-13 NOTE — Telephone Encounter (Signed)
[facility] Humming[facility] the home health aid called stating due to pts insurance changing from [facility] to [place] they have to discharge him.The pt has an appt on 09/15/20 and Morey Hummingbird would like to know if she should wait til after the 6th to readmit the pt or does Dr.[name] want her to readmit him before his upcoming appt?  Morey Hummingbird CB# [phone]

## 2020-09-14 NOTE — Telephone Encounter (Signed)
I called and spoke with [facility] and she advised that she wanted to wait to recert the pt under his new insurance until after his appt with [place] tomorrow ( if needed) I advised that I will hold this message and if the pt needs continued wound care then I will fax intake coordinator at [phone].

## 2020-09-15 ENCOUNTER — Ambulatory Visit (INDEPENDENT_AMBULATORY_CARE_PROVIDER_SITE_OTHER): Payer: 59 | Admitting: Physician Assistant

## 2020-09-15 ENCOUNTER — Encounter: Payer: Self-pay | Admitting: Orthopedic Surgery

## 2020-09-15 ENCOUNTER — Other Ambulatory Visit: Payer: Self-pay

## 2020-09-15 DIAGNOSIS — L97221 Non-pressure chronic ulcer of left calf limited to breakdown of skin: Secondary | ICD-10-CM

## 2020-09-15 NOTE — Progress Notes (Signed)
Office Visit Note   Patient: MANJOT HINKS           Date of Birth: July 10, 1956           MRN: [id number] Visit Date: 09/15/2020              Requested by: [name], PA-C [address] [place],  [place] [address] PCP: [name], PA-C  No chief complaint on file.     HPI: Patient is a pleasant 65 year old gentleman who we have been following for venous insufficiency and an ulceration when his upright brace placed pressure given the inside of his calf. He has been undergoing serial Profore dressings both with [place] and with home health. He is doing much better today. He has brought with him a two XL vive compression stocking  Assessment & Plan: Visit Diagnoses: No diagnosis found.  Plan: Patient has used the compression stockings before. It was placed on him all the wrinkles were removed. I was told him to keep an eye on his leg and be sure that he does not get any skin breakdown in his leg or behind his knee. Follow-up in 2 weeks new prescription was provided to him to work with [facility] to obtain a upright brace that does not put pressure on his leg  Follow-Up Instructions: No follow-ups on file.   Ortho Exam  Patient is alert, oriented, no adenopathy, well-dressed, normal affect, normal respiratory effort. Examination left lower extremity ulcer has created quite well he has had significant decrease in swelling in the leg. He has chronic skin changes with venous insufficiency but no open ulcers. No cellulitis. No foul odor.  Imaging: No results found. No images are attached to the encounter.  Labs: Lab Results  Component Value Date   HGBA1C 5.7 (H) 09/16/2018   HGBA1C 7.7 (H) 05/15/2018   HGBA1C 7.1 (H) 01/07/2018   ESRSEDRATE 35 (H) 09/03/2016   ESRSEDRATE 111 (H) 05/11/2016   ESRSEDRATE 98 (H) 05/04/2016   CRP 1.2 (H) 09/03/2016   CRP 4.7 (H) 05/11/2016   CRP 8.0 (H) 05/04/2016   REPTSTATUS 11/15/2017 FINAL 11/10/2017   GRAMSTAIN  11/04/2017    ABUNDANT WBC  PRESENT, PREDOMINANTLY PMN MODERATE GRAM NEGATIVE RODS MODERATE GRAM POSITIVE COCCI Performed at [facility], [place] [address], [place], [place] [address]    CULT  11/10/2017    NO GROWTH 5 DAYS Performed at [facility] [address], [place], [place] [address]    LABORGA STAPHYLOCOCCUS AUREUS 09/26/2014     Lab Results  Component Value Date   ALBUMIN 3.7 (L) 05/30/2020   ALBUMIN 2.9 (L) 06/19/2019   ALBUMIN 3.5 05/15/2018   PREALBUMIN 19.0 05/04/2016   PREALBUMIN 25.0 12/16/2015    Lab Results  Component Value Date   MG 1.9 05/24/2018   MG 2.1 05/23/2018   MG 1.3 (L) 05/22/2018   No results found for: VD25OH  Lab Results  Component Value Date   PREALBUMIN 19.0 05/04/2016   PREALBUMIN 25.0 12/16/2015   CBC EXTENDED Latest Ref Rng & Units 08/29/2020 08/15/2020 08/01/2020  WBC 3.4 - 10.8 x10E3/uL - - -  RBC 4.14 - 5.80 x10E6/uL - - -  HGB 13.0 - 17.0 g/dL 11.5(L) 10.8(L) 11.1(L)  HCT 39.0 - 52.0 % - - -  PLT 150 - 450 x10E3/uL - - -  NEUTROABS 1.7 - 7.7 K/uL - - -  LYMPHSABS 0.7 - 4.0 K/uL - - -     There is no  height or weight on file to calculate BMI.  Orders:  No orders of the defined types were placed in this encounter.  No orders of the defined types were placed in this encounter.    Procedures: No procedures performed  Clinical Data: No additional findings.  ROS:  All other systems negative, except as noted in the HPI. Review of Systems  Objective: Vital Signs: There were no vitals taken for this visit.  Specialty Comments:  No specialty comments available.  PMFS History: Patient Active Problem List   Diagnosis Date Noted  . Carotid stenosis 06/15/2020  . Dyslipidemia 06/15/2020  . Severe nonproliferative diabetic retinopathy of right eye, with macular edema, associated with type 2 diabetes mellitus ([place]) 01/18/2020  . Severe nonproliferative diabetic retinopathy of left eye, with macular edema, associated with type 2  diabetes mellitus ([place]) 01/04/2020  . Retinal hemorrhage of right eye 01/04/2020  . 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 Pseudoclaudication 11/15/2018  . Essential hypertension 09/18/2018  . S/P BKA (below knee amputation), right ([place]) 09/18/2018  . Amputation of toe of left foot ([place]) 09/18/2018  . Peripheral artery disease ([place]) 09/18/2018  . S/P carotid endarterectomy 09/18/2018  . OSA on CPAP 09/18/2018  . Chronic back pain 09/18/2018  . Status post reversal of ileostomy 05/21/2018  . Normocytic anemia 02/14/2018  . Intra-abdominal abscess ([place]) 11/04/2017  . Chronic venous hypertension (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 ([place]) 12/11/2016  . Unilateral primary osteoarthritis, left knee 10/11/2016  . History of right below knee amputation ([place]) 07/12/2016  . Diabetic foot infection ([place]) 05/04/2016  . Type 2 diabetes mellitus with insulin therapy ([place]) 12/16/2015  . Amputated toe ([place]) 10/21/2015  . Leg edema, left 09/03/2015  . CAD (dz of distal, mid and proximal RCA with implantation of 3 overlapping drug-eluting stent,) 09/03/2015  . Chronic diastolic heart failure, NYHA class 2 ([place]) 09/03/2015  . Angina pectoris associated with type 2 diabetes mellitus ([place]) 10/28/2014  . Hyperlipidemia 08/25/2014  . Limb pain 03/20/2013  . DJD (degenerative joint disease) 09/25/2012  . Migraine 09/25/2012  . Neuropathy 09/25/2012  . Restless legs syndrome (RLS) 09/25/2012  . Chronic obstructive pulmonary disease, unspecified ([place]) 04/25/2012  . Unknown cause of morbidity or mortality 04/25/2012  . Chronic total occlusion of artery of the extremities ([place]) 04/08/2012  . Onychomycosis 02/01/2012  . Occlusion and stenosis of carotid artery without mention of cerebral infarction 06/12/2011  . GERD (gastroesophageal reflux disease) 05/08/2011  . Barrett's esophagus without dysplasia 05/08/2011  . Former tobacco use 02/01/2011   Past Medical History:  Diagnosis  Date  . Carotid artery occlusion 11/10/10   LEFT CAROTID ENDARTERECTOMY  . Chronic kidney disease   . Complication of anesthesia    BP WENT UP AT [facility] "  . COPD (chronic obstructive pulmonary disease) ([place])    pt denies this dx as of 06/01/20 - no inhaler   . Diabetes mellitus without complication ([place])   . Diverticulitis   . Diverticulosis of colon (without mention of hemorrhage)   . DJD (degenerative joint disease)    knees/hands/feet/back/neck  . Fatty liver   . Full dentures   . GERD (gastroesophageal reflux disease)   . H/O hiatal hernia   . History of blood transfusion    with a past surical procedure per patient 06/01/20  . Hyperlipidemia   . Hypertension   . Neuromuscular disorder ([place])    peripheral neuropathy  . Non-pressure chronic ulcer of other part of left foot limited to  breakdown of skin ([place]) 11/12/2016  . Osteomyelitis ([place])    left 5th metatarsal  . PAD (peripheral artery disease) ([place])    Distal aortogram June 2012. Atherectomy left popliteal artery July 2012.   . Pseudoclaudication 11/15/2018  . Sleep apnea    pt denies this dx as of 06/01/20  . Slurred speech    AS PER WIFE IN D/C NOTE 11/10/10  . 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 Unstable angina ([place]) 09/16/2018  . Wears glasses     Family History  Problem Relation Age of Onset  . Heart disease Father        Before age 65-  CAD, BPG  . Diabetes Father        Amputation  . Cancer Father        PROSTATE  . Hyperlipidemia Father   . Hypertension Father   . Heart attack Father        Triple BPG  . Varicose Veins Father   . Colon cancer Brother   . Diabetes Brother   . Heart disease Brother 42       A-Fib. Before age 69  . Hyperlipidemia Brother   . Hypertension Brother   . Cancer Sister        Breast  . Hyperlipidemia Sister   . Hypertension Sister   . Hypertension Son   . Arthritis Other        GRANDMOTHER  . Hypertension Other        OTHER FAMILY MEMBERS    Past Surgical History:  Procedure  Laterality Date  . AMPUTATION  11/05/2011   Procedure: AMPUTATION RAY;  Surgeon: Wylene Simmer, MD;  Location: [facility];  Service: Orthopedics;  Laterality: Right;  Amputation of Right 4&5th Toes  . AMPUTATION Left 11/26/2012   Procedure: AMPUTATION RAY;  Surgeon: Wylene Simmer, MD;  Location: [facility];  Service: Orthopedics;  Laterality: Left;  fourth ray amputation  . AMPUTATION Right 08/27/2014   Procedure: Transmetatarsal Amputation;  Surgeon: Newt Minion, MD;  Location: [facility];  Service: Orthopedics;  Laterality: Right;  . AMPUTATION Right 01/14/2015   Procedure: AMPUTATION BELOW KNEE;  Surgeon: Newt Minion, MD;  Location: [facility];  Service: Orthopedics;  Laterality: Right;  . AMPUTATION Left 10/21/2015   Procedure: Left Foot 5th Ray Amputation;  Surgeon: Newt Minion, MD;  Location: [facility];  Service: Orthopedics;  Laterality: Left;  . ANTERIOR FUSION CERVICAL SPINE  02/06/06   C4-5, C5-6, C6-7; SURGEON DR. MAX COHEN  . AV FISTULA PLACEMENT Left 06/02/2020   Procedure: ARTERIOVENOUS (AV) FISTULA CREATION LEFT;  Surgeon: Waynetta Sandy, MD;  Location: [facility];  Service: Vascular;  Laterality: Left;  . BACK SURGERY     x 3  . BASCILIC VEIN TRANSPOSITION Left 07/21/2020   Procedure: LEFT UPPER ARM ATERIOVENOUS SUPERFISTULALIZATION;  Surgeon: Waynetta Sandy, MD;  Location: [facility];  Service: Vascular;  Laterality: Left;  . BELOW KNEE LEG AMPUTATION Right   . CARDIAC CATHETERIZATION  10/31/04   2009  . CAROTID ENDARTERECTOMY  11/10/10  . CAROTID ENDARTERECTOMY Left 11/10/2010   Subtotal occlusion of left internal carotid artery with left hemispheric transient ischemic attacks.  . CAROTID STENT    . CARPAL TUNNEL RELEASE Right 10/21/2013   Procedure: RIGHT CARPAL TUNNEL RELEASE;  Surgeon: Wynonia Sours, MD;  Location: [facility];  Service: Orthopedics;  Laterality: Right;  . CHOLECYSTECTOMY    . COLON SURGERY    . COLONOSCOPY    . COLOSTOMY  REVERSAL  05/21/2018   ileostomy  reversal  . CYSTOSCOPY WITH STENT PLACEMENT Bilateral 01/13/2018   Procedure: CYSTOSCOPY WITH BILATERAL URETERAL CATHETER PLACEMENT;  Surgeon: Ardis Hughs, MD;  Location: [facility];  Service: Urology;  Laterality: Bilateral;  . ESOPHAGEAL MANOMETRY Bilateral 07/19/2014   Procedure: ESOPHAGEAL MANOMETRY (EM);  Surgeon: Jerene Bears, MD;  [facility];  Service: Gastroenterology;  Laterality: Bilateral;  . EYE SURGERY Bilateral 2020   cataract  . FEMORAL ARTERY STENT     x6  . FINGER SURGERY    . FOOT SURGERY  04/25/2016    EXCISION BASE 5TH METATARSAL AND PARTIAL CUBOID LEFT FOOT  . HERNIA REPAIR     LEFT INGUINAL AND UMBILICAL REPAIRS  . HERNIA REPAIR    . I & D EXTREMITY Left 04/25/2016   Procedure: EXCISION BASE 5TH METATARSAL AND PARTIAL CUBOID LEFT FOOT;  Surgeon: Newt Minion, MD;  Location: [facility];  Service: Orthopedics;  Laterality: Left;  . ILEOSTOMY  01/13/2018   Procedure: ILEOSTOMY;  Surgeon: Clovis Riley, MD;  Location[facility];  Service: General;;  . ILEOSTOMY CLOSURE N/A 05/21/2018   Procedure: ILEOSTOMY REVERSAL ERAS PATHWAY;  Surgeon: Clovis Riley, MD;  Location: [facility];  Service: General;  Laterality: N/A;  . IR RADIOLOGIST EVAL & MGMT  11/19/2017  . IR RADIOLOGIST EVAL & MGMT  12/03/2017  . IR RADIOLOGIST EVAL & MGMT  12/18/2017  . JOINT REPLACEMENT Right 2001   Total knee  . LAMINECTOMY     X 3 LUMBAR AND X 2 CERVICAL SPINE OPERATIONS  . LAPAROSCOPIC CHOLECYSTECTOMY W/ CHOLANGIOGRAPHY  11/09/04   SURGEON DR. Luella Cook  . LEFT HEART CATH AND CORONARY ANGIOGRAPHY N/A 09/16/2018   Procedure: LEFT HEART CATH AND CORONARY ANGIOGRAPHY;  Surgeon: Nigel Mormon, MD;  Location: [facility];  Service: Cardiovascular;  Laterality: N/A;  . LEFT HEART CATHETERIZATION WITH CORONARY ANGIOGRAM N/A 10/29/2014   Procedure: LEFT HEART CATHETERIZATION WITH CORONARY ANGIOGRAM;  Surgeon: Laverda Page, MD;  Location: [facility];  Service: Cardiovascular;   Laterality: N/A;  . LIGATION OF COMPETING BRANCHES OF ARTERIOVENOUS FISTULA Left 07/21/2020   Procedure: LIGATION OF COMPETING BRANCHES OF LEFT UPPER ARM ARTERIOVENOUS FISTULA;  Surgeon: Waynetta Sandy, MD;  Location: [facility];  Service: Vascular;  Laterality: Left;  . LOWER EXTREMITY ANGIOGRAM N/A 03/19/2012   Procedure: LOWER EXTREMITY ANGIOGRAM;  Surgeon: Burnell Blanks, MD;  Location: [facility];  Service: Cardiovascular;  Laterality: N/A;  . NECK SURGERY    . PARTIAL COLECTOMY N/A 01/13/2018   Procedure: LAPAROSCOPIC ASSISTED   SIGMOID COLECTOMY ILEOSTOMY;  Surgeon: Clovis Riley, MD;  Location[facility];  Service: General;  Laterality: N/A;  . PENILE PROSTHESIS IMPLANT  08/14/05   INFRAPUBIC INSERTION OF INFLATABLE PENILE PROSTHESIS; SURGEON DR. Amalia Hailey  . PENILE PROSTHESIS IMPLANT    . PERCUTANEOUS CORONARY STENT INTERVENTION (PCI-S) Right 10/29/2014   Procedure: PERCUTANEOUS CORONARY STENT INTERVENTION (PCI-S);  Surgeon: Laverda Page, MD;  Location: [facility];  Service: Cardiovascular;  Laterality: Right;  . SHOULDER ARTHROSCOPY    . SPINE SURGERY    . TOE AMPUTATION Left   . TONSILLECTOMY    . TOTAL KNEE ARTHROPLASTY  07/2002   RIGHT KNEE ; SURGEON  DR. [name] ALSO HAD ARTHROSCOPIC RIGHT KNEE IN  10/2001  . TOTAL KNEE ARTHROPLASTY    . ULNAR NERVE TRANSPOSITION Right 10/21/2013   Procedure: RIGHT ELBOW  ULNAR NERVE DECOMPRESSION;  Surgeon: Dominica Severin  Beola Cord, MD;  Location: [facility];  Service: Orthopedics;  Laterality: Right;   Social History   Occupational History  . Occupation: Magazine features editor: UNEMPLOYED  Tobacco Use  . Smoking status: Former Smoker    Packs/day: 2.00    Years: 35.00    Pack years: 70.00    Types: Cigarettes    Quit date: 10/28/2011    Years since quitting: 8.8  . Smokeless tobacco: Never Used  Vaping Use  . Vaping Use: Some days  . Substances: Nicotine  Substance and Sexual Activity  . Alcohol use: Not  Currently    Comment: "not in a long time"  . Drug use: Never  . Sexual activity: Yes    Birth control/protection: Implant    Comment: penile implant

## 2020-09-15 NOTE — Telephone Encounter (Signed)
No wound care is needed.

## 2020-09-19 ENCOUNTER — Encounter (HOSPITAL_COMMUNITY)
Admission: RE | Admit: 2020-09-19 | Discharge: 2020-09-19 | Disposition: A | Discharge status: Home / Self Care | Payer: 59 | Source: Ambulatory Visit | Attending: Nephrology | Admitting: Nephrology

## 2020-09-19 DIAGNOSIS — N184 Chronic kidney disease, stage 4 (severe): Secondary | ICD-10-CM | POA: Diagnosis not present

## 2020-09-19 DIAGNOSIS — D649 Anemia, unspecified: Secondary | ICD-10-CM | POA: Diagnosis present

## 2020-09-19 DIAGNOSIS — D631 Anemia in chronic kidney disease: Secondary | ICD-10-CM | POA: Diagnosis not present

## 2020-09-19 LAB — POCT HEMOGLOBIN-HEMACUE: Hemoglobin: 11 g/dL — ABNORMAL LOW (ref 13.0–17.0)

## 2020-09-19 MED ORDER — EPOETIN ALFA-EPBX 10000 UNIT/ML IJ SOLN: 20000.0000 [IU] | Freq: Once | INTRAMUSCULAR | Status: DC

## 2020-09-19 MED ORDER — EPOETIN ALFA-EPBX 10000 UNIT/ML IJ SOLN
INTRAMUSCULAR | Status: AC
  Filled 2020-09-19: qty 2

## 2020-09-26 ENCOUNTER — Encounter (HOSPITAL_COMMUNITY): Payer: 59

## 2020-09-30 ENCOUNTER — Ambulatory Visit (INDEPENDENT_AMBULATORY_CARE_PROVIDER_SITE_OTHER): Payer: 59 | Admitting: Physician Assistant

## 2020-09-30 ENCOUNTER — Encounter: Payer: Self-pay | Admitting: Physician Assistant

## 2020-09-30 DIAGNOSIS — L97221 Non-pressure chronic ulcer of left calf limited to breakdown of skin: Secondary | ICD-10-CM

## 2020-09-30 MED ORDER — DOXYCYCLINE HYCLATE 100 MG PO TABS: 100.0000 mg | ORAL_TABLET | Freq: Two times a day (BID) | ORAL | 0 refills | Status: DC

## 2020-09-30 NOTE — Progress Notes (Signed)
Office Visit Note   Patient: [name]           Date of Birth: 12-23-1955           MRN: [id number] Visit Date: 09/30/2020              Requested by: [name], PA-C [address] [place],  [place] [address] PCP: [name], PA-C  No chief complaint on file.     HPI: Patient is a pleasant 64 year old gentleman who we have been treating for chronic lower extremity venous insufficiency.  At his last visit he was doing well and we placed him in a compression sock with directions on how to apply it.  Unfortunately he has had some increased swelling with more areas of soreness and small ulcers on his leg including a development of a more necrotic looking ulcer on his lateral ankle wife says he has not been eating very well.  Denies any fever chills   Assessment & Plan: Visit Diagnoses: No diagnosis found.  Plan: I asked the wife if they have been in touch with primary care and she said that they had actually talked to a kidney specialist.  We will place him on doxycycline over the weekend and he is to come back Tuesday to reexamine his lower extremity.  We will wrap him in a compression wrap today.  I cautioned his family that if he has any deterioration including increased pain more loss of appetite fever chills they are to take him to the emergency room  Follow-Up Instructions: No follow-ups on file.   Ortho Exam  Patient is alert, oriented, no adenopathy, well-dressed, normal affect, normal respiratory effort. Left lower extremity.  He has pitting edema from his foot to his calf.  He has some skin breakdown on the medial lateral side of his foot but no ascending cellulitis.  Over the lateral ankle he has developed a 3 x 2 area of necrotic eschar.  I did remove some of this to brisk bleeding which was coagulated with a silver nitrate stick.  Does not probe deeply to bone at this time.  No ascending cellulitis  Imaging: No results found. No images are attached to the  encounter.  Labs: Lab Results  Component Value Date   HGBA1C 5.7 (H) 09/16/2018   HGBA1C 7.7 (H) 05/15/2018   HGBA1C 7.1 (H) 01/07/2018   ESRSEDRATE 35 (H) 09/03/2016   ESRSEDRATE 111 (H) 05/11/2016   ESRSEDRATE 98 (H) 05/04/2016   CRP 1.2 (H) 09/03/2016   CRP 4.7 (H) 05/11/2016   CRP 8.0 (H) 05/04/2016   REPTSTATUS 11/15/2017 FINAL 11/10/2017   GRAMSTAIN  11/04/2017    ABUNDANT WBC PRESENT, PREDOMINANTLY PMN MODERATE GRAM NEGATIVE RODS MODERATE GRAM POSITIVE COCCI Performed at [facility], [place] [address], [place], [place] [address]    CULT  11/10/2017    NO GROWTH 5 DAYS Performed at [facility] [address], [place], [place] [address]    LABORGA STAPHYLOCOCCUS AUREUS 09/26/2014     Lab Results  Component Value Date   ALBUMIN 3.7 (L) 05/30/2020   ALBUMIN 2.9 (L) 06/19/2019   ALBUMIN 3.5 05/15/2018   PREALBUMIN 19.0 05/04/2016   PREALBUMIN 25.0 12/16/2015    Lab Results  Component Value Date   MG 1.9 05/24/2018   MG 2.1 05/23/2018   MG 1.3 (L) 05/22/2018   No results found for: [facility]  Lab Results  Component Value Date   PREALBUMIN 19.0 05/04/2016  PREALBUMIN 25.0 12/16/2015   CBC EXTENDED Latest Ref Rng & Units 09/19/2020 08/29/2020 08/15/2020  WBC 3.4 - 10.8 x10E3/uL - - -  RBC 4.14 - 5.80 x10E6/uL - - -  HGB 13.0 - 17.0 g/dL 11.0(L) 11.5(L) 10.8(L)  HCT 39.0 - 52.0 % - - -  PLT 150 - 450 x10E3/uL - - -  NEUTROABS 1.7 - 7.7 K/uL - - -  LYMPHSABS 0.7 - 4.0 K/uL - - -     There is no height or weight on file to calculate BMI.  Orders:  No orders of the defined types were placed in this encounter.  Meds ordered this encounter  Medications  . doxycycline (VIBRA-TABS) 100 MG tablet    Sig: Take 1 tablet (100 mg total) by mouth 2 (two) times daily.    Dispense:  60 tablet    Refill:  0     Procedures: No procedures performed  Clinical Data: No additional findings.  ROS:  All other systems negative, except as noted in the  HPI. Review of Systems  Objective: Vital Signs: There were no vitals taken for this visit.  Specialty Comments:  No specialty comments available.  PMFS History: Patient Active Problem List   Diagnosis Date Noted  . Carotid stenosis 06/15/2020  . Dyslipidemia 06/15/2020  . Severe nonproliferative diabetic retinopathy of right eye, with macular edema, associated with type 2 diabetes mellitus ([place]) 01/18/2020  . Severe nonproliferative diabetic retinopathy of left eye, with macular edema, associated with type 2 diabetes mellitus ([place]) 01/04/2020  . Retinal hemorrhage of right eye 01/04/2020  . 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 Pseudoclaudication 11/15/2018  . Essential hypertension 09/18/2018  . S/P BKA (below knee amputation), right ([place]) 09/18/2018  . Amputation of toe of left foot ([place]) 09/18/2018  . Peripheral artery disease ([place]) 09/18/2018  . S/P carotid endarterectomy 09/18/2018  . OSA on CPAP 09/18/2018  . Chronic back pain 09/18/2018  . Status post reversal of ileostomy 05/21/2018  . Normocytic anemia 02/14/2018  . Intra-abdominal abscess ([place]) 11/04/2017  . Chronic venous hypertension (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 ([place]) 12/11/2016  . Unilateral primary osteoarthritis, left knee 10/11/2016  . History of right below knee amputation ([place]) 07/12/2016  . Diabetic foot infection ([place]) 05/04/2016  . Type 2 diabetes mellitus with insulin therapy ([place]) 12/16/2015  . Amputated toe ([place]) 10/21/2015  . Leg edema, left 09/03/2015  . CAD (dz of distal, mid and proximal RCA with implantation of 3 overlapping drug-eluting stent,) 09/03/2015  . Chronic diastolic heart failure, NYHA class 2 ([place]) 09/03/2015  . Angina pectoris associated with type 2 diabetes mellitus ([place]) 10/28/2014  . Hyperlipidemia 08/25/2014  . Limb pain 03/20/2013  . DJD (degenerative joint disease) 09/25/2012  . Migraine 09/25/2012  . Neuropathy 09/25/2012  . Restless legs  syndrome (RLS) 09/25/2012  . Chronic obstructive pulmonary disease, unspecified ([place]) 04/25/2012  . Unknown cause of morbidity or mortality 04/25/2012  . Chronic total occlusion of artery of the extremities ([place]) 04/08/2012  . Onychomycosis 02/01/2012  . Occlusion and stenosis of carotid artery without mention of cerebral infarction 06/12/2011  . GERD (gastroesophageal reflux disease) 05/08/2011  . Barrett's esophagus without dysplasia 05/08/2011  . Former tobacco use 02/01/2011   Past Medical History:  Diagnosis Date  . Carotid artery occlusion 11/10/10   LEFT CAROTID ENDARTERECTOMY  . Chronic kidney disease   . Complication of anesthesia    BP WENT UP AT [facility] "  . COPD (chronic obstructive pulmonary disease) ([place])  pt denies this dx as of 06/01/20 - no inhaler   . Diabetes mellitus without complication ([place])   . Diverticulitis   . Diverticulosis of colon (without mention of hemorrhage)   . DJD (degenerative joint disease)    knees/hands/feet/back/neck  . Fatty liver   . Full dentures   . GERD (gastroesophageal reflux disease)   . H/O hiatal hernia   . History of blood transfusion    with a past surical procedure per patient 06/01/20  . Hyperlipidemia   . Hypertension   . Neuromuscular disorder ([place])    peripheral neuropathy  . Non-pressure chronic ulcer of other part of left foot limited to breakdown of skin ([place]) 11/12/2016  . Osteomyelitis ([place])    left 5th metatarsal  . PAD (peripheral artery disease) ([place])    Distal aortogram June 2012. Atherectomy left popliteal artery July 2012.   . Pseudoclaudication 11/15/2018  . Sleep apnea    pt denies this dx as of 06/01/20  . Slurred speech    AS PER WIFE IN D/C NOTE 11/10/10  . 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 Unstable angina ([place]) 09/16/2018  . Wears glasses     Family History  Problem Relation Age of Onset  . Heart disease Father        Before age 37-  CAD, BPG  . Diabetes Father        Amputation  . Cancer Father         PROSTATE  . Hyperlipidemia Father   . Hypertension Father   . Heart attack Father        Triple BPG  . Varicose Veins Father   . Colon cancer Brother   . Diabetes Brother   . Heart disease Brother 30       A-Fib. Before age 61  . Hyperlipidemia Brother   . Hypertension Brother   . Cancer Sister        Breast  . Hyperlipidemia Sister   . Hypertension Sister   . Hypertension Son   . Arthritis Other        GRANDMOTHER  . Hypertension Other        OTHER FAMILY MEMBERS    Past Surgical History:  Procedure Laterality Date  . AMPUTATION  11/05/2011   Procedure: AMPUTATION RAY;  Surgeon: Wylene Simmer, MD;  Location: [facility];  Service: Orthopedics;  Laterality: Right;  Amputation of Right 4&5th Toes  . AMPUTATION Left 11/26/2012   Procedure: AMPUTATION RAY;  Surgeon: Wylene Simmer, MD;  Location: [facility];  Service: Orthopedics;  Laterality: Left;  fourth ray amputation  . AMPUTATION Right 08/27/2014   Procedure: Transmetatarsal Amputation;  Surgeon: Newt Minion, MD;  Location: [facility];  Service: Orthopedics;  Laterality: Right;  . AMPUTATION Right 01/14/2015   Procedure: AMPUTATION BELOW KNEE;  Surgeon: Newt Minion, MD;  Location: [facility];  Service: Orthopedics;  Laterality: Right;  . AMPUTATION Left 10/21/2015   Procedure: Left Foot 5th Ray Amputation;  Surgeon: Newt Minion, MD;  Location: [facility];  Service: Orthopedics;  Laterality: Left;  . ANTERIOR FUSION CERVICAL SPINE  02/06/06   C4-5, C5-6, C6-7; SURGEON DR. MAX COHEN  . AV FISTULA PLACEMENT Left 06/02/2020   Procedure: ARTERIOVENOUS (AV) FISTULA CREATION LEFT;  Surgeon: Waynetta Sandy, MD;  Location: [facility];  Service: Vascular;  Laterality: Left;  . BACK SURGERY     x 3  . BASCILIC VEIN TRANSPOSITION Left 07/21/2020   Procedure: LEFT UPPER ARM ATERIOVENOUS SUPERFISTULALIZATION;  Surgeon: Donzetta Matters,  [place]ia Dom, MD;  Location: [facility];  Service: Vascular;  Laterality: Left;  . BELOW KNEE LEG AMPUTATION Right   . CARDIAC  CATHETERIZATION  10/31/04   2009  . CAROTID ENDARTERECTOMY  11/10/10  . CAROTID ENDARTERECTOMY Left 11/10/2010   Subtotal occlusion of left internal carotid artery with left hemispheric transient ischemic attacks.  . CAROTID STENT    . CARPAL TUNNEL RELEASE Right 10/21/2013   Procedure: RIGHT CARPAL TUNNEL RELEASE;  Surgeon: Wynonia Sours, MD;  Location: [facility];  Service: Orthopedics;  Laterality: Right;  . CHOLECYSTECTOMY    . COLON SURGERY    . COLONOSCOPY    . COLOSTOMY REVERSAL  05/21/2018   ileostomy reversal  . CYSTOSCOPY WITH STENT PLACEMENT Bilateral 01/13/2018   Procedure: CYSTOSCOPY WITH BILATERAL URETERAL CATHETER PLACEMENT;  Surgeon: Ardis Hughs, MD;  Location: [facility];  Service: Urology;  Laterality: Bilateral;  . ESOPHAGEAL MANOMETRY Bilateral 07/19/2014   Procedure: ESOPHAGEAL MANOMETRY (EM);  Surgeon: Jerene Bears, MD;  [facility];  Service: Gastroenterology;  Laterality: Bilateral;  . EYE SURGERY Bilateral 2020   cataract  . FEMORAL ARTERY STENT     x6  . FINGER SURGERY    . FOOT SURGERY  04/25/2016    EXCISION BASE 5TH METATARSAL AND PARTIAL CUBOID LEFT FOOT  . HERNIA REPAIR     LEFT INGUINAL AND UMBILICAL REPAIRS  . HERNIA REPAIR    . I & D EXTREMITY Left 04/25/2016   Procedure: EXCISION BASE 5TH METATARSAL AND PARTIAL CUBOID LEFT FOOT;  Surgeon: Newt Minion, MD;  Location: [facility];  Service: Orthopedics;  Laterality: Left;  . ILEOSTOMY  01/13/2018   Procedure: ILEOSTOMY;  Surgeon: Clovis Riley, MD;  Location[facility];  Service: General;;  . ILEOSTOMY CLOSURE N/A 05/21/2018   Procedure: ILEOSTOMY REVERSAL ERAS PATHWAY;  Surgeon: Clovis Riley, MD;  Location: [facility];  Service: General;  Laterality: N/A;  . IR RADIOLOGIST EVAL & MGMT  11/19/2017  . IR RADIOLOGIST EVAL & MGMT  12/03/2017  . IR RADIOLOGIST EVAL & MGMT  12/18/2017  . JOINT REPLACEMENT Right 2001   Total knee  . LAMINECTOMY     X 3 LUMBAR AND X 2 CERVICAL SPINE  OPERATIONS  . LAPAROSCOPIC CHOLECYSTECTOMY W/ CHOLANGIOGRAPHY  11/09/04   SURGEON DR. Luella Cook  . LEFT HEART CATH AND CORONARY ANGIOGRAPHY N/A 09/16/2018   Procedure: LEFT HEART CATH AND CORONARY ANGIOGRAPHY;  Surgeon: Nigel Mormon, MD;  Location: [facility];  Service: Cardiovascular;  Laterality: N/A;  . LEFT HEART CATHETERIZATION WITH CORONARY ANGIOGRAM N/A 10/29/2014   Procedure: LEFT HEART CATHETERIZATION WITH CORONARY ANGIOGRAM;  Surgeon: Laverda Page, MD;  Location[facility];  Service: Cardiovascular;  Laterality: N/A;  . LIGATION OF COMPETING BRANCHES OF ARTERIOVENOUS FISTULA Left 07/21/2020   Procedure: LIGATION OF COMPETING BRANCHES OF LEFT UPPER ARM ARTERIOVENOUS FISTULA;  Surgeon: Waynetta Sandy, MD;  Location: [facility];  Service: Vascular;  Laterality: Left;  . LOWER EXTREMITY ANGIOGRAM N/A 03/19/2012   Procedure: LOWER EXTREMITY ANGIOGRAM;  Surgeon: Burnell Blanks, MD;  Location: [facility];  Service: Cardiovascular;  Laterality: N/A;  . NECK SURGERY    . PARTIAL COLECTOMY N/A 01/13/2018   Procedure: LAPAROSCOPIC ASSISTED   SIGMOID COLECTOMY ILEOSTOMY;  Surgeon: Clovis Riley, MD;  Location[facility];  Service: General;  Laterality: N/A;  . PENILE PROSTHESIS IMPLANT  08/14/05   INFRAPUBIC INSERTION OF INFLATABLE PENILE PROSTHESIS; SURGEON DR. Amalia Hailey  .  PENILE PROSTHESIS IMPLANT    . PERCUTANEOUS CORONARY STENT INTERVENTION (PCI-S) Right 10/29/2014   Procedure: PERCUTANEOUS CORONARY STENT INTERVENTION (PCI-S);  Surgeon: Laverda Page, MD;  Location: [facility] LAB;  Service: Cardiovascular;  Laterality: Right;  . SHOULDER ARTHROSCOPY    . SPINE SURGERY    . TOE AMPUTATION Left   . TONSILLECTOMY    . TOTAL KNEE ARTHROPLASTY  07/2002   RIGHT KNEE ; SURGEON  DR. [name] ALSO HAD ARTHROSCOPIC RIGHT KNEE IN  10/2001  . TOTAL KNEE ARTHROPLASTY    . ULNAR NERVE TRANSPOSITION Right 10/21/2013   Procedure: RIGHT ELBOW  ULNAR NERVE DECOMPRESSION;   Surgeon: Wynonia Sours, MD;  Location: [facility];  Service: Orthopedics;  Laterality: Right;   Social History   Occupational History  . Occupation: Magazine features editor: UNEMPLOYED  Tobacco Use  . Smoking status: Former Smoker    Packs/day: 2.00    Years: 35.00    Pack years: 70.00    Types: Cigarettes    Quit date: 10/28/2011    Years since quitting: 8.9  . Smokeless tobacco: Never Used  Vaping Use  . Vaping Use: Some days  . Substances: Nicotine  Substance and Sexual Activity  . Alcohol use: Not Currently    Comment: "not in a long time"  . Drug use: Never  . Sexual activity: Yes    Birth control/protection: Implant    Comment: penile implant

## 2020-10-03 ENCOUNTER — Encounter (INDEPENDENT_AMBULATORY_CARE_PROVIDER_SITE_OTHER): Payer: Self-pay | Admitting: Ophthalmology

## 2020-10-03 ENCOUNTER — Ambulatory Visit (INDEPENDENT_AMBULATORY_CARE_PROVIDER_SITE_OTHER): Payer: 59 | Admitting: Ophthalmology

## 2020-10-03 ENCOUNTER — Other Ambulatory Visit: Payer: Self-pay

## 2020-10-03 ENCOUNTER — Encounter (HOSPITAL_COMMUNITY)
Admission: RE | Admit: 2020-10-03 | Discharge: 2020-10-03 | Disposition: A | Discharge status: Home / Self Care | Payer: 59 | Source: Ambulatory Visit | Attending: Nephrology | Admitting: Nephrology

## 2020-10-03 VITALS — BP 122/47 | HR 57 | Resp 18

## 2020-10-03 DIAGNOSIS — D649 Anemia, unspecified: Secondary | ICD-10-CM | POA: Diagnosis not present

## 2020-10-03 DIAGNOSIS — N184 Chronic kidney disease, stage 4 (severe): Secondary | ICD-10-CM | POA: Diagnosis not present

## 2020-10-03 DIAGNOSIS — E113411 Type 2 diabetes mellitus with severe nonproliferative diabetic retinopathy with macular edema, right eye: Secondary | ICD-10-CM

## 2020-10-03 LAB — POCT HEMOGLOBIN-HEMACUE: Hemoglobin: 9.5 g/dL — ABNORMAL LOW (ref 13.0–17.0)

## 2020-10-03 LAB — IRON AND TIBC
Iron: 36 ug/dL — ABNORMAL LOW (ref 45–182)
Saturation Ratios: 20 % (ref 17.9–39.5)
TIBC: 183 ug/dL — ABNORMAL LOW (ref 250–450)
UIBC: 147 ug/dL

## 2020-10-03 LAB — FERRITIN: Ferritin: 284 ng/mL (ref 24–336)

## 2020-10-03 MED ORDER — EPOETIN ALFA-EPBX 10000 UNIT/ML IJ SOLN: 20000.0000 [IU] | INTRAMUSCULAR | Status: DC

## 2020-10-03 MED ORDER — EPOETIN ALFA-EPBX 10000 UNIT/ML IJ SOLN
INTRAMUSCULAR | Status: AC
  Administered 2020-10-03: 20000 [IU] via SUBCUTANEOUS
  Filled 2020-10-03: qty 2

## 2020-10-03 NOTE — Progress Notes (Signed)
10/03/2020     CHIEF COMPLAINT Patient presents for Retina Follow Up (3 WK FU PRP OD TEMPORAL RETINA///Pt reports vision has been blurry on and off (pt thinks it's unstable blood sugar), pt denies any new F/F, pain or pressure OU. )   HISTORY OF PRESENT ILLNESS: [name] is a 65 y.o. male who presents to the clinic today for:   HPI    Retina Follow Up    Patient presents with  Diabetic Retinopathy.  In right eye.  This started 3 weeks ago.  Severity is mild.  Duration of 3 weeks. Additional comments: 3 WK FU PRP OD TEMPORAL RETINA   Pt reports vision has been blurry on and off (pt thinks it's unstable blood sugar), pt denies any new F/F, pain or pressure OU.        Last edited by Nichola Sizer D on 10/03/2020  2:49 PM. (History)      Referring physician: [name], PA-C [place],  [place] [address]  HISTORICAL INFORMATION:   Selected notes from the MEDICAL RECORD NUMBER    Lab Results  Component Value Date   HGBA1C 5.7 (H) 09/16/2018     CURRENT MEDICATIONS: Current Outpatient Medications (Ophthalmic Drugs)  Medication Sig  . Polyvinyl Alcohol-Povidone PF (REFRESH) 1.4-0.6 % SOLN Place 1 drop into both eyes 2 (two) times daily as needed (dry eyes).    No current facility-administered medications for this visit. (Ophthalmic Drugs)   Current Outpatient Medications (Other)  Medication Sig  . amLODipine (NORVASC) 10 MG tablet Take 1 tablet (10 mg total) by mouth daily.  Marland Kitchen amoxicillin (AMOXIL) 250 MG chewable tablet Chew 250 mg by mouth 2 (two) times daily.  Marland Kitchen aspirin EC 81 MG tablet Take 81 mg by mouth daily.  Marland Kitchen atorvastatin (LIPITOR) 40 MG tablet Take 40 mg by mouth at bedtime.   Marland Kitchen BIDIL 20-37.5 MG tablet Take 1 tablet by mouth 3 (three) times daily.  . carvedilol (COREG) 25 MG tablet Take 1.5 tablets (37.5 mg total) by mouth 2 (two) times daily.  . clobetasol (TEMOVATE) 0.05 % external solution Apply 1 application topically daily as needed  (dry scalp).  . clopidogrel (PLAVIX) 75 MG tablet Take 75 mg by mouth daily.   Marland Kitchen doxycycline (VIBRA-TABS) 100 MG tablet Take 1 tablet (100 mg total) by mouth 2 (two) times daily.  Marland Kitchen doxycycline (VIBRA-TABS) 100 MG tablet Take 1 tablet (100 mg total) by mouth 2 (two) times daily.  . fentaNYL (DURAGESIC - DOSED MCG/HR) 50 MCG/HR Place 50 mcg onto the skin every 3 (three) days.  . ferrous gluconate (FERGON) 324 MG tablet Take 324 mg by mouth 2 (two) times daily.  . fluticasone (CUTIVATE) 0.05 % cream Apply 1 application topically 2 (two) times daily as needed (dry skin on face).   . folic acid (FOLVITE) 1 MG tablet Take 1 mg by mouth daily.  . Glucosamine HCl (GLUCOSAMINE PO) Take 1 tablet by mouth 2 (two) times daily.   Marland Kitchen HUMALOG KWIKPEN 100 UNIT/ML KiwkPen Inject 15-16 Units into the skin See admin instructions. Inject 13 units before breakfast, lunch, & dinner--  . insulin glargine (LANTUS) 100 UNIT/ML injection Inject 55 Units into the skin at bedtime.  Marland Kitchen ketoconazole (NIZORAL) 2 % cream Apply 1 application topically 2 (two) times daily as needed for irritation.   Marland Kitchen linaclotide (LINZESS) 145 MCG CAPS capsule Take 145 mcg by mouth daily as needed (constipation).   Marland Kitchen loratadine (CLARITIN) 10 MG tablet Take  10 mg by mouth daily.  . Multiple Vitamin (MULTIVITAMIN) tablet Take 1 tablet by mouth daily.  Marland Kitchen NEEDLE, REUSABLE, 22 G 22G X 1-1/2" MISC 1 Units by Does not apply route as directed. For B12 IM inj  . omega-3 acid ethyl esters (LOVAZA) 1 g capsule Take 2 g by mouth 2 (two) times daily.  . ondansetron (ZOFRAN) 4 MG tablet Take 4 mg by mouth every 6 (six) hours as needed for nausea or vomiting.  . pantoprazole (PROTONIX) 40 MG tablet Take 40 mg by mouth daily.  . pregabalin (LYRICA) 100 MG capsule Take 1 capsule (100 mg total) by mouth 3 (three) times daily.  Marland Kitchen rOPINIRole (REQUIP) 2 MG tablet Take 2 mg by mouth at bedtime.   . Syringe/Needle, Disp, (SYRINGE 3CC/22GX1-1/2") 22G X 1-1/2" 3 ML MISC  1 Syringe by Does not apply route as directed. For b12 IM inj  . tamsulosin (FLOMAX) 0.4 MG CAPS capsule Take 0.4 mg by mouth daily.  Marland Kitchen torsemide (DEMADEX) 20 MG tablet Take 20 mg by mouth See admin instructions. Take 20 mg daily, may take a second 20 mg dose before 1500 as needed for swelling   No current facility-administered medications for this visit. (Other)   Facility-Administered Medications Ordered in Other Visits (Other)  Medication Route  . epoetin alfa-epbx (RETACRIT) injection 20,000 Units Subcutaneous      REVIEW OF SYSTEMS:    ALLERGIES Allergies  Allergen Reactions  . Contrast Media [Iodinated Diagnostic Agents]     Difficulty breathing    . Ivp Dye [Iodinated Diagnostic Agents] Anaphylaxis and Other (See Comments)    Breathing problems, altered mental state   . Adhesive [Tape] Rash    Rash after 3-4 days use    PAST MEDICAL HISTORY Past Medical History:  Diagnosis Date  . Carotid artery occlusion 11/10/10   LEFT CAROTID ENDARTERECTOMY  . Chronic kidney disease   . Complication of anesthesia    BP WENT UP AT [facility] "  . COPD (chronic obstructive pulmonary disease) ([place])    pt denies this dx as of 06/01/20 - no inhaler   . Diabetes mellitus without complication ([place])   . Diverticulitis   . Diverticulosis of colon (without mention of hemorrhage)   . DJD (degenerative joint disease)    knees/hands/feet/back/neck  . Fatty liver   . Full dentures   . GERD (gastroesophageal reflux disease)   . H/O hiatal hernia   . History of blood transfusion    with a past surical procedure per patient 06/01/20  . Hyperlipidemia   . Hypertension   . Neuromuscular disorder ([place])    peripheral neuropathy  . Non-pressure chronic ulcer of other part of left foot limited to breakdown of skin ([place]) 11/12/2016  . Osteomyelitis ([place])    left 5th metatarsal  . PAD (peripheral artery disease) ([place])    Distal aortogram June 2012. Atherectomy left popliteal artery July 2012.   .  Pseudoclaudication 11/15/2018  . Sleep apnea    pt denies this dx as of 06/01/20  . Slurred speech    AS PER WIFE IN D/C NOTE 11/10/10  . 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 Unstable angina ([place]) 09/16/2018  . Wears glasses    Past Surgical History:  Procedure Laterality Date  . AMPUTATION  11/05/2011   Procedure: AMPUTATION RAY;  Surgeon: Wylene Simmer, MD;  Location: [place];  Service: Orthopedics;  Laterality: Right;  Amputation of Right 4&5th Toes  . AMPUTATION Left 11/26/2012   Procedure: AMPUTATION RAY;  Surgeon: Wylene Simmer, MD;  Location: [place];  Service: Orthopedics;  Laterality: Left;  fourth ray amputation  . AMPUTATION Right 08/27/2014   Procedure: Transmetatarsal Amputation;  Surgeon: Newt Minion, MD;  Location: [place];  Service: Orthopedics;  Laterality: Right;  . AMPUTATION Right 01/14/2015   Procedure: AMPUTATION BELOW KNEE;  Surgeon: Newt Minion, MD;  Location: [place];  Service: Orthopedics;  Laterality: Right;  . AMPUTATION Left 10/21/2015   Procedure: Left Foot 5th Ray Amputation;  Surgeon: Newt Minion, MD;  Location: [place];  Service: Orthopedics;  Laterality: Left;  . ANTERIOR FUSION CERVICAL SPINE  02/06/06   C4-5, C5-6, C6-7; SURGEON DR. MAX COHEN  . AV FISTULA PLACEMENT Left 06/02/2020   Procedure: ARTERIOVENOUS (AV) FISTULA CREATION LEFT;  Surgeon: Waynetta Sandy, MD;  Location: [place];  Service: Vascular;  Laterality: Left;  . BACK SURGERY     x 3  . BASCILIC VEIN TRANSPOSITION Left 07/21/2020   Procedure: LEFT UPPER ARM ATERIOVENOUS SUPERFISTULALIZATION;  Surgeon: Waynetta Sandy, MD;  Location: [place];  Service: Vascular;  Laterality: Left;  . BELOW KNEE LEG AMPUTATION Right   . CARDIAC CATHETERIZATION  10/31/04   2009  . CAROTID ENDARTERECTOMY  11/10/10  . CAROTID ENDARTERECTOMY Left 11/10/2010   Subtotal occlusion of left internal carotid artery with left hemispheric transient ischemic attacks.  . CAROTID STENT    . CARPAL TUNNEL RELEASE Right 10/21/2013    Procedure: RIGHT CARPAL TUNNEL RELEASE;  Surgeon: Wynonia Sours, MD;  Location: [place];  Service: Orthopedics;  Laterality: Right;  . CHOLECYSTECTOMY    . COLON SURGERY    . COLONOSCOPY    . COLOSTOMY REVERSAL  05/21/2018   ileostomy reversal  . CYSTOSCOPY WITH STENT PLACEMENT Bilateral 01/13/2018   Procedure: CYSTOSCOPY WITH BILATERAL URETERAL CATHETER PLACEMENT;  Surgeon: Ardis Hughs, MD;  Location: [facility];  Service: Urology;  Laterality: Bilateral;  . ESOPHAGEAL MANOMETRY Bilateral 07/19/2014   Procedure: ESOPHAGEAL MANOMETRY (EM);  Surgeon: Jerene Bears, MD;  [facility];  Service: Gastroenterology;  Laterality: Bilateral;  . EYE SURGERY Bilateral 2020   cataract  . FEMORAL ARTERY STENT     x6  . FINGER SURGERY    . FOOT SURGERY  04/25/2016    EXCISION BASE 5TH METATARSAL AND PARTIAL CUBOID LEFT FOOT  . HERNIA REPAIR     LEFT INGUINAL AND UMBILICAL REPAIRS  . HERNIA REPAIR    . I & D EXTREMITY Left 04/25/2016   Procedure: EXCISION BASE 5TH METATARSAL AND PARTIAL CUBOID LEFT FOOT;  Surgeon: Newt Minion, MD;  Location: [place];  Service: Orthopedics;  Laterality: Left;  . ILEOSTOMY  01/13/2018   Procedure: ILEOSTOMY;  Surgeon: Clovis Riley, MD;  Location[facility];  Service: General;;  . ILEOSTOMY CLOSURE N/A 05/21/2018   Procedure: ILEOSTOMY REVERSAL ERAS PATHWAY;  Surgeon: Clovis Riley, MD;  Location: [place];  Service: General;  Laterality: N/A;  . IR RADIOLOGIST EVAL & MGMT  11/19/2017  . IR RADIOLOGIST EVAL & MGMT  12/03/2017  . IR RADIOLOGIST EVAL & MGMT  12/18/2017  . JOINT REPLACEMENT Right 2001   Total knee  . LAMINECTOMY     X 3 LUMBAR AND X 2 CERVICAL SPINE OPERATIONS  . LAPAROSCOPIC CHOLECYSTECTOMY W/ CHOLANGIOGRAPHY  11/09/04   SURGEON DR. Luella Cook  . LEFT HEART CATH AND CORONARY ANGIOGRAPHY N/A 09/16/2018   Procedure: LEFT HEART CATH AND CORONARY ANGIOGRAPHY;  Surgeon: Nigel Mormon,  MD;  Location: [facility];   Service: Cardiovascular;  Laterality: N/A;  . LEFT HEART CATHETERIZATION WITH CORONARY ANGIOGRAM N/A 10/29/2014   Procedure: LEFT HEART CATHETERIZATION WITH CORONARY ANGIOGRAM;  Surgeon: Laverda Page, MD;  Location[facility];  Service: Cardiovascular;  Laterality: N/A;  . LIGATION OF COMPETING BRANCHES OF ARTERIOVENOUS FISTULA Left 07/21/2020   Procedure: LIGATION OF COMPETING BRANCHES OF LEFT UPPER ARM ARTERIOVENOUS FISTULA;  Surgeon: Waynetta Sandy, MD;  Location: [place];  Service: Vascular;  Laterality: Left;  . LOWER EXTREMITY ANGIOGRAM N/A 03/19/2012   Procedure: LOWER EXTREMITY ANGIOGRAM;  Surgeon: Burnell Blanks, MD;  Location[facility];  Service: Cardiovascular;  Laterality: N/A;  . NECK SURGERY    . PARTIAL COLECTOMY N/A 01/13/2018   Procedure: LAPAROSCOPIC ASSISTED   SIGMOID COLECTOMY ILEOSTOMY;  Surgeon: Clovis Riley, MD;  [facility];  Service: General;  Laterality: N/A;  . PENILE PROSTHESIS IMPLANT  08/14/05   INFRAPUBIC INSERTION OF INFLATABLE PENILE PROSTHESIS; SURGEON DR. Amalia Hailey  . PENILE PROSTHESIS IMPLANT    . PERCUTANEOUS CORONARY STENT INTERVENTION (PCI-S) Right 10/29/2014   Procedure: PERCUTANEOUS CORONARY STENT INTERVENTION (PCI-S);  Surgeon: Laverda Page, MD;  Location: [facility] CATH LAB;  Service: Cardiovascular;  Laterality: Right;  . SHOULDER ARTHROSCOPY    . SPINE SURGERY    . TOE AMPUTATION Left   . TONSILLECTOMY    . TOTAL KNEE ARTHROPLASTY  07/2002   RIGHT KNEE ; SURGEON  DR. [name] ALSO HAD ARTHROSCOPIC RIGHT KNEE IN  10/2001  . TOTAL KNEE ARTHROPLASTY    . ULNAR NERVE TRANSPOSITION Right 10/21/2013   Procedure: RIGHT ELBOW  ULNAR NERVE DECOMPRESSION;  Surgeon: Wynonia Sours, MD;  Location: [place];  Service: Orthopedics;  Laterality: Right;    FAMILY HISTORY Family History  Problem Relation Age of Onset  . Heart disease Father        Before age 23-  CAD, BPG  . Diabetes Father        Amputation  . Cancer  Father        PROSTATE  . Hyperlipidemia Father   . Hypertension Father   . Heart attack Father        Triple BPG  . Varicose Veins Father   . Colon cancer Brother   . Diabetes Brother   . Heart disease Brother 40       A-Fib. Before age 67  . Hyperlipidemia Brother   . Hypertension Brother   . Cancer Sister        Breast  . Hyperlipidemia Sister   . Hypertension Sister   . Hypertension Son   . Arthritis Other        GRANDMOTHER  . Hypertension Other        OTHER FAMILY MEMBERS    SOCIAL HISTORY Social History   Tobacco Use  . Smoking status: Former Smoker    Packs/day: 2.00    Years: 35.00    Pack years: 70.00    Types: Cigarettes    Quit date: 10/28/2011    Years since quitting: 8.9  . Smokeless tobacco: Never Used  Vaping Use  . Vaping Use: Some days  . Substances: Nicotine  Substance Use Topics  . Alcohol use: Not Currently    Comment: "not in a long time"  . Drug use: Never         OPHTHALMIC EXAM: Base Eye Exam    Visual Acuity (ETDRS)      Right  Left   Dist [place] 20/60 +1 20/25 +2   Dist ph [place] 20/50        Tonometry (Tonopen, 2:53 PM)      Right Left   Pressure 9 11       Pupils      Pupils Dark Light Shape React APD   Right PERRL 3 2 Round Brisk None   Left PERRL 3 2 Round Brisk None       Visual Fields (Counting fingers)      Left Right    Full Full       Extraocular Movement      Right Left    Full Full       Neuro/Psych    Oriented x3: Yes   Mood/Affect: Normal       Dilation    Right eye: 1.0% Mydriacyl, 2.5% Phenylephrine @ 2:53 PM        Slit Lamp and Fundus Exam    External Exam      Right Left   External Normal Normal       Slit Lamp Exam      Right Left   Lids/Lashes Normal Normal   Conjunctiva/Sclera White and quiet White and quiet   Cornea Clear Clear   Anterior Chamber Deep and quiet Deep and quiet   Iris Round and reactive Round and reactive   Lens Posterior chamber intraocular lens Posterior chamber  intraocular lens   Anterior Vitreous Normal Normal       Fundus Exam      Right Left   Posterior Vitreous Normal    Disc Normal    C/D Ratio 0.4    Macula Exudates, Microaneurysms, Mild clinically significant macular edema    Vessels NPDR severe, with numerous dot blot hemorrhages, VCABS, Irma    Periphery Normal, good PRP nasal retina           IMAGING AND PROCEDURES  Imaging and Procedures for 10/03/20  Iron and TIBC     Component Value Flag Ref Range Units Status   Iron 36      45 - 182 ug/dL Final   TIBC 183      250 - 450 ug/dL Final   Saturation Ratios 20      17.9 - 39.5 % Final   UIBC 147       ug/dL Final   Comment:   Performed at [facility], [place] [address] [place], [place] [address]        Ferritin     Component Value Flag Ref Range Units Status   Ferritin 284      24 - 336 ng/mL Final   Comment:   Performed at [facility], [place] [address] [place], [place] [address]        Hemoglobin-hemacue, POC     Component Value Flag Ref Range Units Status   Hemoglobin 9.5      13.0 - 17.0 g/dL Final        Panretinal Photocoagulation - OD - Right Eye       Laser Information The duration in seconds was 0.02. The spot size was 390 microns. Laser power was 300. Total spots was 8014.   Notes PRP applied temporally OD,, inferiorly                ASSESSMENT/PLAN:  Severe nonproliferative diabetic retinopathy of right eye, with macular edema, associated with type 2 diabetes mellitus (HCC) Severe nonproliferative disease with numerous dot blot  hemorrhages temporally, will deliver PRP to this region to decrease vegF load      ICD-10-CM   1. Severe nonproliferative diabetic retinopathy of right eye, with macular edema, associated with type 2 diabetes mellitus (HCC)  J67.3419 Panretinal Photocoagulation - OD - Right Eye    1. PRP #2 OD, as completion from November 20 66-month treatment delivered temporally and inferotemporally today  2.  CSME still very active will need reevaluation 3 weeks and likely injection intravitreal Eylea  3.  Ophthalmic Meds Ordered this visit:  No orders of the defined types were placed in this encounter.      Return in about 3 weeks (around 10/24/2020) for dilate, OD, EYLEA OCT.  There are no Patient Instructions on file for this visit.   Explained the diagnoses, plan, and follow up with the patient and they expressed understanding.  Patient expressed understanding of the importance of proper follow up care.   Clent Demark Elhadji Pecore M.D. Diseases & Surgery of the Retina and Vitreous Retina & Diabetic [place] 10/03/20     Abbreviations: M myopia (nearsighted); A astigmatism; H hyperopia (farsighted); P presbyopia; Mrx spectacle prescription;  CTL contact lenses; OD right eye; OS left eye; OU both eyes  XT exotropia; ET esotropia; PEK punctate epithelial keratitis; PEE punctate epithelial erosions; DES dry eye syndrome; MGD meibomian gland dysfunction; ATs artificial tears; PFAT's preservative free artificial tears; [place] nuclear sclerotic cataract; PSC posterior subcapsular cataract; ERM epi-retinal membrane; PVD posterior vitreous detachment; RD retinal detachment; DM diabetes mellitus; DR diabetic retinopathy; NPDR non-proliferative diabetic retinopathy; PDR proliferative diabetic retinopathy; CSME clinically significant macular edema; DME diabetic macular edema; dbh dot blot hemorrhages; CWS cotton wool spot; POAG primary open angle glaucoma; C/D cup-to-disc ratio; HVF humphrey visual field; GVF goldmann visual field; OCT optical coherence tomography; IOP intraocular pressure; BRVO Branch retinal vein occlusion; CRVO central retinal vein occlusion; CRAO central retinal artery occlusion; BRAO branch retinal artery occlusion; RT retinal tear; SB scleral buckle; PPV pars plana vitrectomy; VH Vitreous hemorrhage; PRP panretinal laser photocoagulation; IVK intravitreal kenalog; VMT vitreomacular traction;  MH Macular hole;  NVD neovascularization of the disc; NVE neovascularization elsewhere; AREDS age related eye disease study; ARMD age related macular degeneration; POAG primary open angle glaucoma; EBMD epithelial/anterior basement membrane dystrophy; ACIOL anterior chamber intraocular lens; IOL intraocular lens; PCIOL posterior chamber intraocular lens; Phaco/IOL phacoemulsification with intraocular lens placement; [place] photorefractive keratectomy; LASIK laser assisted in situ keratomileusis; HTN hypertension; DM diabetes mellitus; COPD chronic obstructive pulmonary disease

## 2020-10-03 NOTE — Assessment & Plan Note (Signed)
Severe nonproliferative disease with numerous dot blot hemorrhages temporally, will deliver PRP to this region to decrease vegF load

## 2020-10-04 ENCOUNTER — Ambulatory Visit (INDEPENDENT_AMBULATORY_CARE_PROVIDER_SITE_OTHER): Payer: 59 | Admitting: Orthopedic Surgery

## 2020-10-04 DIAGNOSIS — L97221 Non-pressure chronic ulcer of left calf limited to breakdown of skin: Secondary | ICD-10-CM | POA: Diagnosis not present

## 2020-10-05 ENCOUNTER — Other Ambulatory Visit: Payer: Self-pay

## 2020-10-05 ENCOUNTER — Telehealth: Payer: Self-pay

## 2020-10-05 ENCOUNTER — Encounter: Payer: Self-pay | Admitting: Orthopedic Surgery

## 2020-10-05 NOTE — Telephone Encounter (Signed)
-----   Message from Pamella Pert, [place] sent at 10/04/2020  5:00 PM EST ----- Call [facility] [phone]  fax # [phone] [facility] once a week for profore dressing application LLE

## 2020-10-05 NOTE — Progress Notes (Signed)
Office Visit Note   Patient: [name]           Date of Birth: 1956-04-15           MRN: [id number] Visit Date: 10/04/2020              Requested by: [name], PA-C [address] [place],  [place] [address] PCP: [name], PA-C  Chief Complaint  Patient presents with  . Left Leg - Follow-up      HPI: Patient is seen in follow-up for venous insufficiency.  Patient is currently on doxycycline twice a day.  He is status post a fourth and fifth ray amputations  Assessment & Plan: Visit Diagnoses:  1. Skin ulcer of calf, left, limited to breakdown of skin ([place])     Plan: We will apply a Dynaflex compression wrap for the venous insufficiency and see if we cannot heal the eschar with improved circulation.  We will set patient up as he request for home health dressing changes weekly.  Follow-Up Instructions: Return in about 4 weeks (around 11/01/2020).   Ortho Exam  Patient is alert, oriented, no adenopathy, well-dressed, normal affect, normal respiratory effort. Examination the fourth and fifth ray amputations have healed well there is no redness no cellulitis no drainage the medial calf ulcer is also healed.  Patient now has a new ulcer over the lateral malleolus that is 3 x 5 cm in diameter with no drainage or cellulitis.  The entire wound is covered with a black eschar.  There is no exposed bone or tendon.  Imaging: No results found. No images are attached to the encounter.  Labs: Lab Results  Component Value Date   HGBA1C 5.7 (H) 09/16/2018   HGBA1C 7.7 (H) 05/15/2018   HGBA1C 7.1 (H) 01/07/2018   ESRSEDRATE 35 (H) 09/03/2016   ESRSEDRATE 111 (H) 05/11/2016   ESRSEDRATE 98 (H) 05/04/2016   CRP 1.2 (H) 09/03/2016   CRP 4.7 (H) 05/11/2016   CRP 8.0 (H) 05/04/2016   REPTSTATUS 11/15/2017 FINAL 11/10/2017   GRAMSTAIN  11/04/2017    ABUNDANT WBC PRESENT, PREDOMINANTLY PMN MODERATE GRAM NEGATIVE RODS MODERATE GRAM POSITIVE COCCI Performed at [facility], [place] [address] [place], [place] [address]    CULT  11/10/2017    NO GROWTH 5 DAYS Performed at [facility] [address] [place], [place] [address]    LABORGA STAPHYLOCOCCUS AUREUS 09/26/2014     Lab Results  Component Value Date   ALBUMIN 3.7 (L) 05/30/2020   ALBUMIN 2.9 (L) 06/19/2019   ALBUMIN 3.5 05/15/2018   PREALBUMIN 19.0 05/04/2016   PREALBUMIN 25.0 12/16/2015    Lab Results  Component Value Date   MG 1.9 05/24/2018   MG 2.1 05/23/2018   MG 1.3 (L) 05/22/2018   No results found for: VD25OH  Lab Results  Component Value Date   PREALBUMIN 19.0 05/04/2016   PREALBUMIN 25.0 12/16/2015   CBC EXTENDED Latest Ref Rng & Units 10/03/2020 09/19/2020 08/29/2020  WBC 3.4 - 10.8 x10E3/uL - - -  RBC 4.14 - 5.80 x10E6/uL - - -  HGB 13.0 - 17.0 g/dL 9.5(L) 11.0(L) 11.5(L)  HCT 39.0 - 52.0 % - - -  PLT 150 - 450 x10E3/uL - - -  NEUTROABS 1.7 - 7.7 K/uL - - -  LYMPHSABS 0.7 - 4.0 K/uL - - -     There is no height or weight on file to calculate BMI.  Orders:  No orders  of the defined types were placed in this encounter.  No orders of the defined types were placed in this encounter.    Procedures: No procedures performed  Clinical Data: No additional findings.  ROS:  All other systems negative, except as noted in the HPI. Review of Systems  Objective: Vital Signs: There were no vitals taken for this visit.  Specialty Comments:  No specialty comments available.  PMFS History: Patient Active Problem List   Diagnosis Date Noted  . Carotid stenosis 06/15/2020  . Dyslipidemia 06/15/2020  . Severe nonproliferative diabetic retinopathy of right eye, with macular edema, associated with type 2 diabetes mellitus ([place]) 01/18/2020  . Severe nonproliferative diabetic retinopathy of left eye, with macular edema, associated with type 2 diabetes mellitus ([place]) 01/04/2020  . Retinal hemorrhage of right eye 01/04/2020  . 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 Pseudoclaudication 11/15/2018  . Essential hypertension 09/18/2018  . S/P BKA (below knee amputation), right ([place]) 09/18/2018  . Amputation of toe of left foot ([place]) 09/18/2018  . Peripheral artery disease ([place]) 09/18/2018  . S/P carotid endarterectomy 09/18/2018  . OSA on CPAP 09/18/2018  . Chronic back pain 09/18/2018  . Status post reversal of ileostomy 05/21/2018  . Normocytic anemia 02/14/2018  . Intra-abdominal abscess ([place]) 11/04/2017  . Chronic venous hypertension (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 ([place]) 12/11/2016  . Unilateral primary osteoarthritis, left knee 10/11/2016  . History of right below knee amputation ([place]) 07/12/2016  . Diabetic foot infection ([place]) 05/04/2016  . Type 2 diabetes mellitus with insulin therapy ([place]) 12/16/2015  . Amputated toe ([place]) 10/21/2015  . Leg edema, left 09/03/2015  . CAD (dz of distal, mid and proximal RCA with implantation of 3 overlapping drug-eluting stent,) 09/03/2015  . Chronic diastolic heart failure, NYHA class 2 ([place]) 09/03/2015  . Angina pectoris associated with type 2 diabetes mellitus ([place]) 10/28/2014  . Hyperlipidemia 08/25/2014  . Limb pain 03/20/2013  . DJD (degenerative joint disease) 09/25/2012  . Migraine 09/25/2012  . Neuropathy 09/25/2012  . Restless legs syndrome (RLS) 09/25/2012  . Chronic obstructive pulmonary disease, unspecified ([place]) 04/25/2012  . Unknown cause of morbidity or mortality 04/25/2012  . Chronic total occlusion of artery of the extremities ([place]) 04/08/2012  . Onychomycosis 02/01/2012  . Occlusion and stenosis of carotid artery without mention of cerebral infarction 06/12/2011  . GERD (gastroesophageal reflux disease) 05/08/2011  . Barrett's esophagus without dysplasia 05/08/2011  . Former tobacco use 02/01/2011   Past Medical History:  Diagnosis Date  . Carotid artery occlusion 11/10/10   LEFT CAROTID ENDARTERECTOMY  . Chronic kidney disease   . Complication of  anesthesia    BP WENT UP AT [facility] "  . COPD (chronic obstructive pulmonary disease) ([place])    pt denies this dx as of 06/01/20 - no inhaler   . Diabetes mellitus without complication ([place])   . Diverticulitis   . Diverticulosis of colon (without mention of hemorrhage)   . DJD (degenerative joint disease)    knees/hands/feet/back/neck  . Fatty liver   . Full dentures   . GERD (gastroesophageal reflux disease)   . H/O hiatal hernia   . History of blood transfusion    with a past surical procedure per patient 06/01/20  . Hyperlipidemia   . Hypertension   . Neuromuscular disorder ([place])    peripheral neuropathy  . Non-pressure chronic ulcer of other part of left foot limited to breakdown of skin ([place]) 11/12/2016  . Osteomyelitis ([place])    left  5th metatarsal  . PAD (peripheral artery disease) ([place])    Distal aortogram June 2012. Atherectomy left popliteal artery July 2012.   . Pseudoclaudication 11/15/2018  . Sleep apnea    pt denies this dx as of 06/01/20  . Slurred speech    AS PER WIFE IN D/C NOTE 11/10/10  . 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 Unstable angina ([place]) 09/16/2018  . Wears glasses     Family History  Problem Relation Age of Onset  . Heart disease Father        Before age 5-  CAD, BPG  . Diabetes Father        Amputation  . Cancer Father        PROSTATE  . Hyperlipidemia Father   . Hypertension Father   . Heart attack Father        Triple BPG  . Varicose Veins Father   . Colon cancer Brother   . Diabetes Brother   . Heart disease Brother 25       A-Fib. Before age 4  . Hyperlipidemia Brother   . Hypertension Brother   . Cancer Sister        Breast  . Hyperlipidemia Sister   . Hypertension Sister   . Hypertension Son   . Arthritis Other        GRANDMOTHER  . Hypertension Other        OTHER FAMILY MEMBERS    Past Surgical History:  Procedure Laterality Date  . AMPUTATION  11/05/2011   Procedure: AMPUTATION RAY;  Surgeon: Wylene Simmer, MD;  Location: [facility];   Service: Orthopedics;  Laterality: Right;  Amputation of Right 4&5th Toes  . AMPUTATION Left 11/26/2012   Procedure: AMPUTATION RAY;  Surgeon: Wylene Simmer, MD;  Location: [facility];  Service: Orthopedics;  Laterality: Left;  fourth ray amputation  . AMPUTATION Right 08/27/2014   Procedure: Transmetatarsal Amputation;  Surgeon: Newt Minion, MD;  Location: [facility];  Service: Orthopedics;  Laterality: Right;  . AMPUTATION Right 01/14/2015   Procedure: AMPUTATION BELOW KNEE;  Surgeon: Newt Minion, MD;  Location: [facility];  Service: Orthopedics;  Laterality: Right;  . AMPUTATION Left 10/21/2015   Procedure: Left Foot 5th Ray Amputation;  Surgeon: Newt Minion, MD;  Location: [facility];  Service: Orthopedics;  Laterality: Left;  . ANTERIOR FUSION CERVICAL SPINE  02/06/06   C4-5, C5-6, C6-7; SURGEON DR. MAX COHEN  . AV FISTULA PLACEMENT Left 06/02/2020   Procedure: ARTERIOVENOUS (AV) FISTULA CREATION LEFT;  Surgeon: Waynetta Sandy, MD;  Location: [facility];  Service: Vascular;  Laterality: Left;  . BACK SURGERY     x 3  . BASCILIC VEIN TRANSPOSITION Left 07/21/2020   Procedure: LEFT UPPER ARM ATERIOVENOUS SUPERFISTULALIZATION;  Surgeon: Waynetta Sandy, MD;  Location: [facility];  Service: Vascular;  Laterality: Left;  . BELOW KNEE LEG AMPUTATION Right   . CARDIAC CATHETERIZATION  10/31/04   2009  . CAROTID ENDARTERECTOMY  11/10/10  . CAROTID ENDARTERECTOMY Left 11/10/2010   Subtotal occlusion of left internal carotid artery with left hemispheric transient ischemic attacks.  . CAROTID STENT    . CARPAL TUNNEL RELEASE Right 10/21/2013   Procedure: RIGHT CARPAL TUNNEL RELEASE;  Surgeon: Wynonia Sours, MD;  Location: [facility];  Service: Orthopedics;  Laterality: Right;  . CHOLECYSTECTOMY    . COLON SURGERY    . COLONOSCOPY    . COLOSTOMY REVERSAL  05/21/2018   ileostomy reversal  . CYSTOSCOPY WITH STENT PLACEMENT  Bilateral 01/13/2018   Procedure: CYSTOSCOPY WITH BILATERAL URETERAL  CATHETER PLACEMENT;  Surgeon: Ardis Hughs, MD;  Location: [facility];  Service: Urology;  Laterality: Bilateral;  . ESOPHAGEAL MANOMETRY Bilateral 07/19/2014   Procedure: ESOPHAGEAL MANOMETRY (EM);  Surgeon: Jerene Bears, MD;  [facility];  Service: Gastroenterology;  Laterality: Bilateral;  . EYE SURGERY Bilateral 2020   cataract  . FEMORAL ARTERY STENT     x6  . FINGER SURGERY    . FOOT SURGERY  04/25/2016    EXCISION BASE 5TH METATARSAL AND PARTIAL CUBOID LEFT FOOT  . HERNIA REPAIR     LEFT INGUINAL AND UMBILICAL REPAIRS  . HERNIA REPAIR    . I & D EXTREMITY Left 04/25/2016   Procedure: EXCISION BASE 5TH METATARSAL AND PARTIAL CUBOID LEFT FOOT;  Surgeon: Newt Minion, MD;  Location: [facility];  Service: Orthopedics;  Laterality: Left;  . ILEOSTOMY  01/13/2018   Procedure: ILEOSTOMY;  Surgeon: Clovis Riley, MD;  Location[facility];  Service: General;;  . ILEOSTOMY CLOSURE N/A 05/21/2018   Procedure: ILEOSTOMY REVERSAL ERAS PATHWAY;  Surgeon: Clovis Riley, MD;  Location: [facility];  Service: General;  Laterality: N/A;  . IR RADIOLOGIST EVAL & MGMT  11/19/2017  . IR RADIOLOGIST EVAL & MGMT  12/03/2017  . IR RADIOLOGIST EVAL & MGMT  12/18/2017  . JOINT REPLACEMENT Right 2001   Total knee  . LAMINECTOMY     X 3 LUMBAR AND X 2 CERVICAL SPINE OPERATIONS  . LAPAROSCOPIC CHOLECYSTECTOMY W/ CHOLANGIOGRAPHY  11/09/04   SURGEON DR. Luella Cook  . LEFT HEART CATH AND CORONARY ANGIOGRAPHY N/A 09/16/2018   Procedure: LEFT HEART CATH AND CORONARY ANGIOGRAPHY;  Surgeon: Nigel Mormon, MD;  Location: [facility];  Service: Cardiovascular;  Laterality: N/A;  . LEFT HEART CATHETERIZATION WITH CORONARY ANGIOGRAM N/A 10/29/2014   Procedure: LEFT HEART CATHETERIZATION WITH CORONARY ANGIOGRAM;  Surgeon: Laverda Page, MD;  [facility];  Service: Cardiovascular;  Laterality: N/A;  . LIGATION OF COMPETING BRANCHES OF ARTERIOVENOUS FISTULA Left 07/21/2020   Procedure:  LIGATION OF COMPETING BRANCHES OF LEFT UPPER ARM ARTERIOVENOUS FISTULA;  Surgeon: Waynetta Sandy, MD;  Location: [facility];  Service: Vascular;  Laterality: Left;  . LOWER EXTREMITY ANGIOGRAM N/A 03/19/2012   Procedure: LOWER EXTREMITY ANGIOGRAM;  Surgeon: Burnell Blanks, MD;  Location: [facility] LAB;  Service: Cardiovascular;  Laterality: N/A;  . NECK SURGERY    . PARTIAL COLECTOMY N/A 01/13/2018   Procedure: LAPAROSCOPIC ASSISTED   SIGMOID COLECTOMY ILEOSTOMY;  Surgeon: Clovis Riley, MD;  [facility];  Service: General;  Laterality: N/A;  . PENILE PROSTHESIS IMPLANT  08/14/05   INFRAPUBIC INSERTION OF INFLATABLE PENILE PROSTHESIS; SURGEON DR. Amalia Hailey  . PENILE PROSTHESIS IMPLANT    . PERCUTANEOUS CORONARY STENT INTERVENTION (PCI-S) Right 10/29/2014   Procedure: PERCUTANEOUS CORONARY STENT INTERVENTION (PCI-S);  Surgeon: Laverda Page, MD;  Location: [facility] CATH LAB;  Service: Cardiovascular;  Laterality: Right;  . SHOULDER ARTHROSCOPY    . SPINE SURGERY    . TOE AMPUTATION Left   . TONSILLECTOMY    . TOTAL KNEE ARTHROPLASTY  07/2002   RIGHT KNEE ; SURGEON  DR. [name] ALSO HAD ARTHROSCOPIC RIGHT KNEE IN  10/2001  . TOTAL KNEE ARTHROPLASTY    . ULNAR NERVE TRANSPOSITION Right 10/21/2013   Procedure: RIGHT ELBOW  ULNAR NERVE DECOMPRESSION;  Surgeon: Wynonia Sours, MD;  Location: [facility];  Service: Orthopedics;  Laterality: Right;   Social History   Occupational History  . Occupation: Magazine features editor: UNEMPLOYED  Tobacco Use  . Smoking status: Former Smoker    Packs/day: 2.00    Years: 35.00    Pack years: 70.00    Types: Cigarettes    Quit date: 10/28/2011    Years since quitting: 8.9  . Smokeless tobacco: Never Used  Vaping Use  . Vaping Use: Some days  . Substances: Nicotine  Substance and Sexual Activity  . Alcohol use: Not Currently    Comment: "not in a long time"  . Drug use: Never  . Sexual activity: Yes    Birth  control/protection: Implant    Comment: penile implant

## 2020-10-05 NOTE — Telephone Encounter (Signed)
Demo sheet and order faxed per pt request. Asked if [facility] would call to notify if they are unable to staff the referral.

## 2020-10-06 ENCOUNTER — Telehealth: Payer: Self-pay | Admitting: Orthopedic Surgery

## 2020-10-06 NOTE — Telephone Encounter (Signed)
I called and left a message for [name] that order was faxed.  I told her it was fine if they could not change the Profore until Tuesday as we would place a Profore on this Tuesday.  I did ask her that if they could not provide services to contact us so we could contact the patient and have him come in next week for a dressing change here

## 2020-10-06 NOTE — Telephone Encounter (Signed)
Received call from [facility] with [facility] needing referral from [name] for wound care. Ivin Booty said patient just saw [name] 10/04/2020.  Ivin Booty said the admission need to be pushed out to Tuesday of next week 10/11/2020. The number to contact Ivin Booty is [phone]

## 2020-10-11 ENCOUNTER — Telehealth: Payer: Self-pay | Admitting: Orthopedic Surgery

## 2020-10-11 NOTE — Telephone Encounter (Signed)
I called and sw carrie HHN and advised verbal ok for orders below. She did advise that the dressing had rolled down the leg and she had to re wrap above the knee and that there was a small skin tear that she treated with xeroform. Advised that I would call pt and make an appt for follow up with [name] on Thursday to eval the right ankle that pt states is not doing any better despite compression and abx. Appt at 4pm. Will hold message so that I can call HHN with update after appt Thursday.

## 2020-10-11 NOTE — Telephone Encounter (Signed)
[name] with home health called stating she needed to get verbals okayed; she'd like to do once a week for 8 weeks for full PRN; and she also wanted to make Dr.[name] aware the pts L ankle is red, swollen, and painful.   [name]# [phone]

## 2020-10-13 ENCOUNTER — Ambulatory Visit (INDEPENDENT_AMBULATORY_CARE_PROVIDER_SITE_OTHER): Payer: 59 | Admitting: Orthopedic Surgery

## 2020-10-13 DIAGNOSIS — L97221 Non-pressure chronic ulcer of left calf limited to breakdown of skin: Secondary | ICD-10-CM | POA: Diagnosis not present

## 2020-10-14 ENCOUNTER — Telehealth: Payer: Self-pay | Admitting: Orthopedic Surgery

## 2020-10-14 ENCOUNTER — Telehealth: Payer: Self-pay

## 2020-10-14 NOTE — Telephone Encounter (Signed)
Yes please we can see the pt Monday just find out what time and I will open a spot

## 2020-10-14 NOTE — Telephone Encounter (Signed)
2:45 is open

## 2020-10-14 NOTE — Telephone Encounter (Signed)
I called and lm on vm to advise that the pt had been advised to go to ER and he declined ap appt yesterday. Advised that he will more than likely require additional surgery. He will come in on Tuesday if he has not got to the ER for eval as encouraged. Compression dressing applied in office.

## 2020-10-14 NOTE — Telephone Encounter (Signed)
Patient's wife called stating that [name] wants to see pt Monday and not Tuesday. Please call [facility] if you can open a slot for Monday. Phone number is [phone].

## 2020-10-17 ENCOUNTER — Encounter (HOSPITAL_COMMUNITY)
Admission: RE | Admit: 2020-10-17 | Discharge: 2020-10-17 | Disposition: A | Discharge status: Home / Self Care | Payer: 59 | Source: Ambulatory Visit | Attending: Nephrology | Admitting: Nephrology

## 2020-10-17 ENCOUNTER — Ambulatory Visit (INDEPENDENT_AMBULATORY_CARE_PROVIDER_SITE_OTHER): Payer: 59 | Admitting: Ophthalmology

## 2020-10-17 ENCOUNTER — Ambulatory Visit (INDEPENDENT_AMBULATORY_CARE_PROVIDER_SITE_OTHER): Payer: 59 | Admitting: Physician Assistant

## 2020-10-17 ENCOUNTER — Other Ambulatory Visit: Payer: Self-pay

## 2020-10-17 ENCOUNTER — Encounter (INDEPENDENT_AMBULATORY_CARE_PROVIDER_SITE_OTHER): Payer: Self-pay | Admitting: Ophthalmology

## 2020-10-17 ENCOUNTER — Encounter: Payer: Self-pay | Admitting: Orthopedic Surgery

## 2020-10-17 ENCOUNTER — Telehealth: Payer: Self-pay | Admitting: Orthopedic Surgery

## 2020-10-17 VITALS — BP 145/57 | HR 57 | Temp 97.9°F | Resp 18

## 2020-10-17 DIAGNOSIS — E113412 Type 2 diabetes mellitus with severe nonproliferative diabetic retinopathy with macular edema, left eye: Secondary | ICD-10-CM

## 2020-10-17 DIAGNOSIS — D631 Anemia in chronic kidney disease: Secondary | ICD-10-CM | POA: Diagnosis not present

## 2020-10-17 DIAGNOSIS — D649 Anemia, unspecified: Secondary | ICD-10-CM | POA: Diagnosis present

## 2020-10-17 DIAGNOSIS — N184 Chronic kidney disease, stage 4 (severe): Secondary | ICD-10-CM | POA: Insufficient documentation

## 2020-10-17 DIAGNOSIS — L97221 Non-pressure chronic ulcer of left calf limited to breakdown of skin: Secondary | ICD-10-CM | POA: Diagnosis not present

## 2020-10-17 DIAGNOSIS — E113411 Type 2 diabetes mellitus with severe nonproliferative diabetic retinopathy with macular edema, right eye: Secondary | ICD-10-CM

## 2020-10-17 LAB — POCT HEMOGLOBIN-HEMACUE: Hemoglobin: 9.8 g/dL — ABNORMAL LOW (ref 13.0–17.0)

## 2020-10-17 MED ORDER — EPOETIN ALFA-EPBX 10000 UNIT/ML IJ SOLN: 20000.0000 [IU] | INTRAMUSCULAR | Status: DC

## 2020-10-17 MED ORDER — EPOETIN ALFA-EPBX 10000 UNIT/ML IJ SOLN
INTRAMUSCULAR | Status: AC
  Administered 2020-10-17: 20000 [IU] via SUBCUTANEOUS
  Filled 2020-10-17: qty 2

## 2020-10-17 MED ORDER — AFLIBERCEPT 2MG/0.05ML IZ SOLN FOR KALEIDOSCOPE
2.0000 mg | INTRAVITREAL | Status: AC | PRN
  Administered 2020-10-17: 2 mg via INTRAVITREAL

## 2020-10-17 NOTE — Progress Notes (Signed)
Office Visit Note   Patient: [name]           Date of Birth: Nov 30, 1955           MRN: [id number] Visit Date: 10/13/2020              Requested by: [name], PA-C [address] [place],  [place] [address] PCP: [name], PA-C  Chief Complaint  Patient presents with  . Right Ankle - Wound Check      HPI: Patient is a 65 year old gentleman who presents in follow-up for chronic ulcer lateral malleolus right ankle.  Patient complains of increasing pain and redness his compression wrap was changed last week patient presents with the wrap pushed down his leg he is currently on doxycycline twice a day.  Patient does have end-stage renal disease and is being set up for dialysis.  Assessment & Plan: Visit Diagnoses:  1. Skin ulcer of calf, left, limited to breakdown of skin ([place])     Plan: Discussed the importance of elevation maintaining the compression wrap and its original state without adjusting it.  Reevaluate on Monday.  Follow-Up Instructions: Return in about 1 week (around 10/20/2020).   Ortho Exam  Patient is alert, oriented, no adenopathy, well-dressed, normal affect, normal respiratory effort. Examination patient has skin blisters and in equal compression areas on his leg where the dressing has been pushed down.  He has a large ulcer over the lateral malleolus.  After informed consent a 10 blade knife was used to debride the skin and soft tissue back to healthy viable granulation tissue the ulcer is 5 cm in diameter 1 cm deep after debridement prior to debridement the ulcer was 1 cm in diameter and 1 cm deep.  Imaging: No results found. No images are attached to the encounter.  Labs: Lab Results  Component Value Date   HGBA1C 5.7 (H) 09/16/2018   HGBA1C 7.7 (H) 05/15/2018   HGBA1C 7.1 (H) 01/07/2018   ESRSEDRATE 35 (H) 09/03/2016   ESRSEDRATE 111 (H) 05/11/2016   ESRSEDRATE 98 (H) 05/04/2016   CRP 1.2 (H) 09/03/2016   CRP 4.7 (H) 05/11/2016   CRP  8.0 (H) 05/04/2016   REPTSTATUS 11/15/2017 FINAL 11/10/2017   GRAMSTAIN  11/04/2017    ABUNDANT WBC PRESENT, PREDOMINANTLY PMN MODERATE GRAM NEGATIVE RODS MODERATE GRAM POSITIVE COCCI Performed at [facility], [place] [address], [place], [place] [address]    CULT  11/10/2017    NO GROWTH 5 DAYS Performed at [facility] [address], [place], [place] [address]    LABORGA STAPHYLOCOCCUS AUREUS 09/26/2014     Lab Results  Component Value Date   ALBUMIN 3.7 (L) 05/30/2020   ALBUMIN 2.9 (L) 06/19/2019   ALBUMIN 3.5 05/15/2018   PREALBUMIN 19.0 05/04/2016   PREALBUMIN 25.0 12/16/2015    Lab Results  Component Value Date   MG 1.9 05/24/2018   MG 2.1 05/23/2018   MG 1.3 (L) 05/22/2018   No results found for: VD25OH  Lab Results  Component Value Date   PREALBUMIN 19.0 05/04/2016   PREALBUMIN 25.0 12/16/2015   CBC EXTENDED Latest Ref Rng & Units 10/03/2020 09/19/2020 08/29/2020  WBC 3.4 - 10.8 x10E3/uL - - -  RBC 4.14 - 5.80 x10E6/uL - - -  HGB 13.0 - 17.0 g/dL 9.5(L) 11.0(L) 11.5(L)  HCT 39.0 - 52.0 % - - -  PLT 150 - 450 x10E3/uL - - -  NEUTROABS 1.7 - 7.7 K/uL - - -  LYMPHSABS 0.7 - 4.0 K/uL - - -     There is no height or weight on file to calculate BMI.  Orders:  No orders of the defined types were placed in this encounter.  No orders of the defined types were placed in this encounter.    Procedures: No procedures performed  Clinical Data: No additional findings.  ROS:  All other systems negative, except as noted in the HPI. Review of Systems  Objective: Vital Signs: There were no vitals taken for this visit.  Specialty Comments:  No specialty comments available.  PMFS History: Patient Active Problem List   Diagnosis Date Noted  . Carotid stenosis 06/15/2020  . Dyslipidemia 06/15/2020  . Severe nonproliferative diabetic retinopathy of right eye, with macular edema, associated with type 2 diabetes mellitus ([place]) 01/18/2020  .  Severe nonproliferative diabetic retinopathy of left eye, with macular edema, associated with type 2 diabetes mellitus ([place]) 01/04/2020  . Retinal hemorrhage of right eye 01/04/2020  . 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 Pseudoclaudication 11/15/2018  . Essential hypertension 09/18/2018  . S/P BKA (below knee amputation), right ([place]) 09/18/2018  . Amputation of toe of left foot ([place]) 09/18/2018  . Peripheral artery disease ([place]) 09/18/2018  . S/P carotid endarterectomy 09/18/2018  . OSA on CPAP 09/18/2018  . Chronic back pain 09/18/2018  . Status post reversal of ileostomy 05/21/2018  . Normocytic anemia 02/14/2018  . Intra-abdominal abscess ([place]) 11/04/2017  . Chronic venous hypertension (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 ([place]) 12/11/2016  . Unilateral primary osteoarthritis, left knee 10/11/2016  . History of right below knee amputation ([place]) 07/12/2016  . Diabetic foot infection ([place]) 05/04/2016  . Type 2 diabetes mellitus with insulin therapy ([place]) 12/16/2015  . Amputated toe ([place]) 10/21/2015  . Leg edema, left 09/03/2015  . CAD (dz of distal, mid and proximal RCA with implantation of 3 overlapping drug-eluting stent,) 09/03/2015  . Chronic diastolic heart failure, NYHA class 2 ([place]) 09/03/2015  . Angina pectoris associated with type 2 diabetes mellitus ([place]) 10/28/2014  . Hyperlipidemia 08/25/2014  . Limb pain 03/20/2013  . DJD (degenerative joint disease) 09/25/2012  . Migraine 09/25/2012  . Neuropathy 09/25/2012  . Restless legs syndrome (RLS) 09/25/2012  . Chronic obstructive pulmonary disease, unspecified ([place]) 04/25/2012  . Unknown cause of morbidity or mortality 04/25/2012  . Chronic total occlusion of artery of the extremities ([place]) 04/08/2012  . Onychomycosis 02/01/2012  . Occlusion and stenosis of carotid artery without mention of cerebral infarction 06/12/2011  . GERD (gastroesophageal reflux disease) 05/08/2011  . Barrett's esophagus  without dysplasia 05/08/2011  . Former tobacco use 02/01/2011   Past Medical History:  Diagnosis Date  . Carotid artery occlusion 11/10/10   LEFT CAROTID ENDARTERECTOMY  . Chronic kidney disease   . Complication of anesthesia    BP WENT UP AT [facility] "  . COPD (chronic obstructive pulmonary disease) ([place])    pt denies this dx as of 06/01/20 - no inhaler   . Diabetes mellitus without complication ([place])   . Diverticulitis   . Diverticulosis of colon (without mention of hemorrhage)   . DJD (degenerative joint disease)    knees/hands/feet/back/neck  . Fatty liver   . Full dentures   . GERD (gastroesophageal reflux disease)   . H/O hiatal hernia   . History of blood transfusion    with a past surical procedure per patient 06/01/20  . Hyperlipidemia   . Hypertension   . Neuromuscular disorder ([place])  peripheral neuropathy  . Non-pressure chronic ulcer of other part of left foot limited to breakdown of skin ([place]) 11/12/2016  . Osteomyelitis ([place])    left 5th metatarsal  . PAD (peripheral artery disease) ([place])    Distal aortogram June 2012. Atherectomy left popliteal artery July 2012.   . Pseudoclaudication 11/15/2018  . Sleep apnea    pt denies this dx as of 06/01/20  . Slurred speech    AS PER WIFE IN D/C NOTE 11/10/10  . 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 Unstable angina ([place]) 09/16/2018  . Wears glasses     Family History  Problem Relation Age of Onset  . Heart disease Father        Before age 41-  CAD, BPG  . Diabetes Father        Amputation  . Cancer Father        PROSTATE  . Hyperlipidemia Father   . Hypertension Father   . Heart attack Father        Triple BPG  . Varicose Veins Father   . Colon cancer Brother   . Diabetes Brother   . Heart disease Brother 66       A-Fib. Before age 4  . Hyperlipidemia Brother   . Hypertension Brother   . Cancer Sister        Breast  . Hyperlipidemia Sister   . Hypertension Sister   . Hypertension Son   . Arthritis Other         GRANDMOTHER  . Hypertension Other        OTHER FAMILY MEMBERS    Past Surgical History:  Procedure Laterality Date  . AMPUTATION  11/05/2011   Procedure: AMPUTATION RAY;  Surgeon: Wylene Simmer, MD;  Location: [facility];  Service: Orthopedics;  Laterality: Right;  Amputation of Right 4&5th Toes  . AMPUTATION Left 11/26/2012   Procedure: AMPUTATION RAY;  Surgeon: Wylene Simmer, MD;  Location: [facility];  Service: Orthopedics;  Laterality: Left;  fourth ray amputation  . AMPUTATION Right 08/27/2014   Procedure: Transmetatarsal Amputation;  Surgeon: Newt Minion, MD;  Location: [facility];  Service: Orthopedics;  Laterality: Right;  . AMPUTATION Right 01/14/2015   Procedure: AMPUTATION BELOW KNEE;  Surgeon: Newt Minion, MD;  Location: [facility];  Service: Orthopedics;  Laterality: Right;  . AMPUTATION Left 10/21/2015   Procedure: Left Foot 5th Ray Amputation;  Surgeon: Newt Minion, MD;  Location: [facility];  Service: Orthopedics;  Laterality: Left;  . ANTERIOR FUSION CERVICAL SPINE  02/06/06   C4-5, C5-6, C6-7; SURGEON DR. MAX COHEN  . AV FISTULA PLACEMENT Left 06/02/2020   Procedure: ARTERIOVENOUS (AV) FISTULA CREATION LEFT;  Surgeon: Waynetta Sandy, MD;  Location: [facility];  Service: Vascular;  Laterality: Left;  . BACK SURGERY     x 3  . BASCILIC VEIN TRANSPOSITION Left 07/21/2020   Procedure: LEFT UPPER ARM ATERIOVENOUS SUPERFISTULALIZATION;  Surgeon: Waynetta Sandy, MD;  Location: [facility];  Service: Vascular;  Laterality: Left;  . BELOW KNEE LEG AMPUTATION Right   . CARDIAC CATHETERIZATION  10/31/04   2009  . CAROTID ENDARTERECTOMY  11/10/10  . CAROTID ENDARTERECTOMY Left 11/10/2010   Subtotal occlusion of left internal carotid artery with left hemispheric transient ischemic attacks.  . CAROTID STENT    . CARPAL TUNNEL RELEASE Right 10/21/2013   Procedure: RIGHT CARPAL TUNNEL RELEASE;  Surgeon: Wynonia Sours, MD;  Location: [facility];  Service: Orthopedics;  Laterality: Right;  .  CHOLECYSTECTOMY    .  COLON SURGERY    . COLONOSCOPY    . COLOSTOMY REVERSAL  05/21/2018   ileostomy reversal  . CYSTOSCOPY WITH STENT PLACEMENT Bilateral 01/13/2018   Procedure: CYSTOSCOPY WITH BILATERAL URETERAL CATHETER PLACEMENT;  Surgeon: Ardis Hughs, MD;  Location: [facility];  Service: Urology;  Laterality: Bilateral;  . ESOPHAGEAL MANOMETRY Bilateral 07/19/2014   Procedure: ESOPHAGEAL MANOMETRY (EM);  Surgeon: Jerene Bears, MD;  Location[facility];  Service: Gastroenterology;  Laterality: Bilateral;  . EYE SURGERY Bilateral 2020   cataract  . FEMORAL ARTERY STENT     x6  . FINGER SURGERY    . FOOT SURGERY  04/25/2016    EXCISION BASE 5TH METATARSAL AND PARTIAL CUBOID LEFT FOOT  . HERNIA REPAIR     LEFT INGUINAL AND UMBILICAL REPAIRS  . HERNIA REPAIR    . I & D EXTREMITY Left 04/25/2016   Procedure: EXCISION BASE 5TH METATARSAL AND PARTIAL CUBOID LEFT FOOT;  Surgeon: Newt Minion, MD;  Location: [facility];  Service: Orthopedics;  Laterality: Left;  . ILEOSTOMY  01/13/2018   Procedure: ILEOSTOMY;  Surgeon: Clovis Riley, MD;  Location[facility];  Service: General;;  . ILEOSTOMY CLOSURE N/A 05/21/2018   Procedure: ILEOSTOMY REVERSAL ERAS PATHWAY;  Surgeon: Clovis Riley, MD;  Location: [facility];  Service: General;  Laterality: N/A;  . IR RADIOLOGIST EVAL & MGMT  11/19/2017  . IR RADIOLOGIST EVAL & MGMT  12/03/2017  . IR RADIOLOGIST EVAL & MGMT  12/18/2017  . JOINT REPLACEMENT Right 2001   Total knee  . LAMINECTOMY     X 3 LUMBAR AND X 2 CERVICAL SPINE OPERATIONS  . LAPAROSCOPIC CHOLECYSTECTOMY W/ CHOLANGIOGRAPHY  11/09/04   SURGEON DR. Luella Cook  . LEFT HEART CATH AND CORONARY ANGIOGRAPHY N/A 09/16/2018   Procedure: LEFT HEART CATH AND CORONARY ANGIOGRAPHY;  Surgeon: Nigel Mormon, MD;  Location: [facility];  Service: Cardiovascular;  Laterality: N/A;  . LEFT HEART CATHETERIZATION WITH CORONARY ANGIOGRAM N/A 10/29/2014   Procedure: LEFT HEART CATHETERIZATION WITH  CORONARY ANGIOGRAM;  Surgeon: Laverda Page, MD;  Location: [facility];  Service: Cardiovascular;  Laterality: N/A;  . LIGATION OF COMPETING BRANCHES OF ARTERIOVENOUS FISTULA Left 07/21/2020   Procedure: LIGATION OF COMPETING BRANCHES OF LEFT UPPER ARM ARTERIOVENOUS FISTULA;  Surgeon: Waynetta Sandy, MD;  Location: [facility];  Service: Vascular;  Laterality: Left;  . LOWER EXTREMITY ANGIOGRAM N/A 03/19/2012   Procedure: LOWER EXTREMITY ANGIOGRAM;  Surgeon: Burnell Blanks, MD;  Location: [facility];  Service: Cardiovascular;  Laterality: N/A;  . NECK SURGERY    . PARTIAL COLECTOMY N/A 01/13/2018   Procedure: LAPAROSCOPIC ASSISTED   SIGMOID COLECTOMY ILEOSTOMY;  Surgeon: Clovis Riley, MD;  Location[facility];  Service: General;  Laterality: N/A;  . PENILE PROSTHESIS IMPLANT  08/14/05   INFRAPUBIC INSERTION OF INFLATABLE PENILE PROSTHESIS; SURGEON DR. Amalia Hailey  . PENILE PROSTHESIS IMPLANT    . PERCUTANEOUS CORONARY STENT INTERVENTION (PCI-S) Right 10/29/2014   Procedure: PERCUTANEOUS CORONARY STENT INTERVENTION (PCI-S);  Surgeon: Laverda Page, MD;  Location[facility];  Service: Cardiovascular;  Laterality: Right;  . SHOULDER ARTHROSCOPY    . SPINE SURGERY    . TOE AMPUTATION Left   . TONSILLECTOMY    . TOTAL KNEE ARTHROPLASTY  07/2002   RIGHT KNEE ; SURGEON  DR. [name] ALSO HAD ARTHROSCOPIC RIGHT KNEE IN  10/2001  . TOTAL KNEE ARTHROPLASTY    . ULNAR NERVE TRANSPOSITION Right 10/21/2013  Procedure: RIGHT ELBOW  ULNAR NERVE DECOMPRESSION;  Surgeon: Wynonia Sours, MD;  Location: [facility];  Service: Orthopedics;  Laterality: Right;   Social History   Occupational History  . Occupation: Magazine features editor: UNEMPLOYED  Tobacco Use  . Smoking status: Former Smoker    Packs/day: 2.00    Years: 35.00    Pack years: 70.00    Types: Cigarettes    Quit date: 10/28/2011    Years since quitting: 8.9  . Smokeless tobacco: Never Used  Vaping Use  .  Vaping Use: Some days  . Substances: Nicotine  Substance and Sexual Activity  . Alcohol use: Not Currently    Comment: "not in a long time"  . Drug use: Never  . Sexual activity: Yes    Birth control/protection: Implant    Comment: penile implant

## 2020-10-17 NOTE — Progress Notes (Signed)
10/17/2020     CHIEF COMPLAINT Patient presents for Retina Follow Up (3 WK FU OD, POSS EYLEA OD///Pt reports stable vision OD. Pt denies any new F/F, pain, or pressure OD.////Last BS: 140 this AM )   HISTORY OF PRESENT ILLNESS: [name] is a 65 y.o. male who presents to the clinic today for:   HPI    Retina Follow Up    Patient presents with  Diabetic Retinopathy.  In right eye.  This started 3 weeks ago.  Duration of 3 weeks. Additional comments: 3 WK FU OD, POSS EYLEA OD   Pt reports stable vision OD. Pt denies any new F/F, pain, or pressure OD.    Last BS: 140 this AM        Last edited by Nichola Sizer D on 10/17/2020  3:22 PM. (History)      Referring physician: [name], PA-C [place],  [place] [address]  HISTORICAL INFORMATION:   Selected notes from the MEDICAL RECORD NUMBER    Lab Results  Component Value Date   HGBA1C 5.7 (H) 09/16/2018     CURRENT MEDICATIONS: Current Outpatient Medications (Ophthalmic Drugs)  Medication Sig  . Polyvinyl Alcohol-Povidone PF 1.4-0.6 % SOLN Place 1 drop into both eyes 2 (two) times daily as needed (dry eyes).    No current facility-administered medications for this visit. (Ophthalmic Drugs)   Current Outpatient Medications (Other)  Medication Sig  . amLODipine (NORVASC) 10 MG tablet Take 1 tablet (10 mg total) by mouth daily.  Marland Kitchen amoxicillin (AMOXIL) 250 MG chewable tablet Chew 250 mg by mouth 2 (two) times daily.  Marland Kitchen aspirin EC 81 MG tablet Take 81 mg by mouth daily.  Marland Kitchen atorvastatin (LIPITOR) 40 MG tablet Take 40 mg by mouth at bedtime.   Marland Kitchen BIDIL 20-37.5 MG tablet Take 1 tablet by mouth 3 (three) times daily.  . carvedilol (COREG) 25 MG tablet Take 1.5 tablets (37.5 mg total) by mouth 2 (two) times daily.  . clobetasol (TEMOVATE) 0.05 % external solution Apply 1 application topically daily as needed (dry scalp).  . clopidogrel (PLAVIX) 75 MG tablet Take 75 mg by mouth daily.   Marland Kitchen doxycycline  (VIBRA-TABS) 100 MG tablet Take 1 tablet (100 mg total) by mouth 2 (two) times daily.  Marland Kitchen doxycycline (VIBRA-TABS) 100 MG tablet Take 1 tablet (100 mg total) by mouth 2 (two) times daily.  . fentaNYL (DURAGESIC - DOSED MCG/HR) 50 MCG/HR Place 50 mcg onto the skin every 3 (three) days.  . ferrous gluconate (FERGON) 324 MG tablet Take 324 mg by mouth 2 (two) times daily.  . fluticasone (CUTIVATE) 0.05 % cream Apply 1 application topically 2 (two) times daily as needed (dry skin on face).   . folic acid (FOLVITE) 1 MG tablet Take 1 mg by mouth daily.  . Glucosamine HCl (GLUCOSAMINE PO) Take 1 tablet by mouth 2 (two) times daily.   Marland Kitchen HUMALOG KWIKPEN 100 UNIT/ML KiwkPen Inject 15-16 Units into the skin See admin instructions. Inject 13 units before breakfast, lunch, & dinner--  . insulin glargine (LANTUS) 100 UNIT/ML injection Inject 55 Units into the skin at bedtime.  Marland Kitchen ketoconazole (NIZORAL) 2 % cream Apply 1 application topically 2 (two) times daily as needed for irritation.   Marland Kitchen linaclotide (LINZESS) 145 MCG CAPS capsule Take 145 mcg by mouth daily as needed (constipation).   Marland Kitchen loratadine (CLARITIN) 10 MG tablet Take 10 mg by mouth daily.  . Multiple Vitamin (MULTIVITAMIN) tablet Take 1  tablet by mouth daily.  Marland Kitchen NEEDLE, REUSABLE, 22 G 22G X 1-1/2" MISC 1 Units by Does not apply route as directed. For B12 IM inj  . omega-3 acid ethyl esters (LOVAZA) 1 g capsule Take 2 g by mouth 2 (two) times daily.  . ondansetron (ZOFRAN) 4 MG tablet Take 4 mg by mouth every 6 (six) hours as needed for nausea or vomiting.  . pantoprazole (PROTONIX) 40 MG tablet Take 40 mg by mouth daily.  . pregabalin (LYRICA) 100 MG capsule Take 1 capsule (100 mg total) by mouth 3 (three) times daily.  Marland Kitchen rOPINIRole (REQUIP) 2 MG tablet Take 2 mg by mouth at bedtime.   . Syringe/Needle, Disp, (SYRINGE 3CC/22GX1-1/2") 22G X 1-1/2" 3 ML MISC 1 Syringe by Does not apply route as directed. For b12 IM inj  . tamsulosin (FLOMAX) 0.4 MG  CAPS capsule Take 0.4 mg by mouth daily.  Marland Kitchen torsemide (DEMADEX) 20 MG tablet Take 20 mg by mouth See admin instructions. Take 20 mg daily, may take a second 20 mg dose before 1500 as needed for swelling   No current facility-administered medications for this visit. (Other)   Facility-Administered Medications Ordered in Other Visits (Other)  Medication Route  . epoetin alfa-epbx (RETACRIT) injection 20,000 Units Subcutaneous      REVIEW OF SYSTEMS:    ALLERGIES Allergies  Allergen Reactions  . Contrast Media [Iodinated Diagnostic Agents]     Difficulty breathing    . Ivp Dye [Iodinated Diagnostic Agents] Anaphylaxis and Other (See Comments)    Breathing problems, altered mental state   . Adhesive [Tape] Rash    Rash after 3-4 days use    PAST MEDICAL HISTORY Past Medical History:  Diagnosis Date  . Carotid artery occlusion 11/10/10   LEFT CAROTID ENDARTERECTOMY  . Chronic kidney disease   . Complication of anesthesia    BP WENT UP AT [facility] "  . COPD (chronic obstructive pulmonary disease) ([place])    pt denies this dx as of 06/01/20 - no inhaler   . Diabetes mellitus without complication ([place])   . Diverticulitis   . Diverticulosis of colon (without mention of hemorrhage)   . DJD (degenerative joint disease)    knees/hands/feet/back/neck  . Fatty liver   . Full dentures   . GERD (gastroesophageal reflux disease)   . H/O hiatal hernia   . History of blood transfusion    with a past surical procedure per patient 06/01/20  . Hyperlipidemia   . Hypertension   . Neuromuscular disorder ([place])    peripheral neuropathy  . Non-pressure chronic ulcer of other part of left foot limited to breakdown of skin ([place]) 11/12/2016  . Osteomyelitis ([place])    left 5th metatarsal  . PAD (peripheral artery disease) ([place])    Distal aortogram June 2012. Atherectomy left popliteal artery July 2012.   . Pseudoclaudication 11/15/2018  . Sleep apnea    pt denies this dx as of 06/01/20  . Slurred  speech    AS PER WIFE IN D/C NOTE 11/10/10  . 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 Unstable angina ([place]) 09/16/2018  . Wears glasses    Past Surgical History:  Procedure Laterality Date  . AMPUTATION  11/05/2011   Procedure: AMPUTATION RAY;  Surgeon: Wylene Simmer, MD;  Location: [facility];  Service: Orthopedics;  Laterality: Right;  Amputation of Right 4&5th Toes  . AMPUTATION Left 11/26/2012   Procedure: AMPUTATION RAY;  Surgeon: Wylene Simmer, MD;  Location: [facility];  Service: Orthopedics;  Laterality:  Left;  fourth ray amputation  . AMPUTATION Right 08/27/2014   Procedure: Transmetatarsal Amputation;  Surgeon: Newt Minion, MD;  Location: [facility];  Service: Orthopedics;  Laterality: Right;  . AMPUTATION Right 01/14/2015   Procedure: AMPUTATION BELOW KNEE;  Surgeon: Newt Minion, MD;  Location: [facility];  Service: Orthopedics;  Laterality: Right;  . AMPUTATION Left 10/21/2015   Procedure: Left Foot 5th Ray Amputation;  Surgeon: Newt Minion, MD;  Location: [facility];  Service: Orthopedics;  Laterality: Left;  . ANTERIOR FUSION CERVICAL SPINE  02/06/06   C4-5, C5-6, C6-7; SURGEON DR. MAX COHEN  . AV FISTULA PLACEMENT Left 06/02/2020   Procedure: ARTERIOVENOUS (AV) FISTULA CREATION LEFT;  Surgeon: Waynetta Sandy, MD;  Location: [facility];  Service: Vascular;  Laterality: Left;  . BACK SURGERY     x 3  . BASCILIC VEIN TRANSPOSITION Left 07/21/2020   Procedure: LEFT UPPER ARM ATERIOVENOUS SUPERFISTULALIZATION;  Surgeon: Waynetta Sandy, MD;  Location: [facility];  Service: Vascular;  Laterality: Left;  . BELOW KNEE LEG AMPUTATION Right   . CARDIAC CATHETERIZATION  10/31/04   2009  . CAROTID ENDARTERECTOMY  11/10/10  . CAROTID ENDARTERECTOMY Left 11/10/2010   Subtotal occlusion of left internal carotid artery with left hemispheric transient ischemic attacks.  . CAROTID STENT    . CARPAL TUNNEL RELEASE Right 10/21/2013   Procedure: RIGHT CARPAL TUNNEL RELEASE;  Surgeon: Wynonia Sours, MD;  Location: [employer] [facility];  Service: Orthopedics;  Laterality: Right;  . CHOLECYSTECTOMY    . COLON SURGERY    . COLONOSCOPY    . COLOSTOMY REVERSAL  05/21/2018   ileostomy reversal  . CYSTOSCOPY WITH STENT PLACEMENT Bilateral 01/13/2018   Procedure: CYSTOSCOPY WITH BILATERAL URETERAL CATHETER PLACEMENT;  Surgeon: Ardis Hughs, MD;  Location: [facility];  Service: Urology;  Laterality: Bilateral;  . ESOPHAGEAL MANOMETRY Bilateral 07/19/2014   Procedure: ESOPHAGEAL MANOMETRY (EM);  Surgeon: Jerene Bears, MD;  [facility];  Service: Gastroenterology;  Laterality: Bilateral;  . EYE SURGERY Bilateral 2020   cataract  . FEMORAL ARTERY STENT     x6  . FINGER SURGERY    . FOOT SURGERY  04/25/2016    EXCISION BASE 5TH METATARSAL AND PARTIAL CUBOID LEFT FOOT  . HERNIA REPAIR     LEFT INGUINAL AND UMBILICAL REPAIRS  . HERNIA REPAIR    . I & D EXTREMITY Left 04/25/2016   Procedure: EXCISION BASE 5TH METATARSAL AND PARTIAL CUBOID LEFT FOOT;  Surgeon: Newt Minion, MD;  Location: [facility];  Service: Orthopedics;  Laterality: Left;  . ILEOSTOMY  01/13/2018   Procedure: ILEOSTOMY;  Surgeon: Clovis Riley, MD;  Location[facility];  Service: General;;  . ILEOSTOMY CLOSURE N/A 05/21/2018   Procedure: ILEOSTOMY REVERSAL ERAS PATHWAY;  Surgeon: Clovis Riley, MD;  Location: [facility];  Service: General;  Laterality: N/A;  . IR RADIOLOGIST EVAL & MGMT  11/19/2017  . IR RADIOLOGIST EVAL & MGMT  12/03/2017  . IR RADIOLOGIST EVAL & MGMT  12/18/2017  . JOINT REPLACEMENT Right 2001   Total knee  . LAMINECTOMY     X 3 LUMBAR AND X 2 CERVICAL SPINE OPERATIONS  . LAPAROSCOPIC CHOLECYSTECTOMY W/ CHOLANGIOGRAPHY  11/09/04   SURGEON DR. Luella Cook  . LEFT HEART CATH AND CORONARY ANGIOGRAPHY N/A 09/16/2018   Procedure: LEFT HEART CATH AND CORONARY ANGIOGRAPHY;  Surgeon: Nigel Mormon, MD;  Location: [facility];  Service: Cardiovascular;  Laterality: N/A;  .  LEFT HEART CATHETERIZATION WITH CORONARY  ANGIOGRAM N/A 10/29/2014   Procedure: LEFT HEART CATHETERIZATION WITH CORONARY ANGIOGRAM;  Surgeon: Laverda Page, MD;  Location: [facility] C[facility];  Service: Cardiovascular;  Laterality: N/A;  . LIGATION OF COMPETING BRANCHES OF ARTERIOVENOUS FISTULA Left 07/21/2020   Procedure: LIGATION OF COMPETING BRANCHES OF LEFT UPPER ARM ARTERIOVENOUS FISTULA;  Surgeon: Waynetta Sandy, MD;  Location: [facility];  Service: Vascular;  Laterality: Left;  . LOWER EXTREMITY ANGIOGRAM N/A 03/19/2012   Procedure: LOWER EXTREMITY ANGIOGRAM;  Surgeon: Burnell Blanks, MD;  Location: [facility];  Service: Cardiovascular;  Laterality: N/A;  . NECK SURGERY    . PARTIAL COLECTOMY N/A 01/13/2018   Procedure: LAPAROSCOPIC ASSISTED   SIGMOID COLECTOMY ILEOSTOMY;  Surgeon: Clovis Riley, MD;  Location[facility];  Service: General;  Laterality: N/A;  . PENILE PROSTHESIS IMPLANT  08/14/05   INFRAPUBIC INSERTION OF INFLATABLE PENILE PROSTHESIS; SURGEON DR. Amalia Hailey  . PENILE PROSTHESIS IMPLANT    . PERCUTANEOUS CORONARY STENT INTERVENTION (PCI-S) Right 10/29/2014   Procedure: PERCUTANEOUS CORONARY STENT INTERVENTION (PCI-S);  Surgeon: Laverda Page, MD;  Location: [facility] CATH LAB;  Service: Cardiovascular;  Laterality: Right;  . SHOULDER ARTHROSCOPY    . SPINE SURGERY    . TOE AMPUTATION Left   . TONSILLECTOMY    . TOTAL KNEE ARTHROPLASTY  07/2002   RIGHT KNEE ; SURGEON  DR. [name] ALSO HAD ARTHROSCOPIC RIGHT KNEE IN  10/2001  . TOTAL KNEE ARTHROPLASTY    . ULNAR NERVE TRANSPOSITION Right 10/21/2013   Procedure: RIGHT ELBOW  ULNAR NERVE DECOMPRESSION;  Surgeon: Wynonia Sours, MD;  Location: [facility];  Service: Orthopedics;  Laterality: Right;    FAMILY HISTORY Family History  Problem Relation Age of Onset  . Heart disease Father        Before age 103-  CAD, BPG  . Diabetes Father        Amputation  . Cancer Father        PROSTATE  . Hyperlipidemia Father   . Hypertension Father   . Heart  attack Father        Triple BPG  . Varicose Veins Father   . Colon cancer Brother   . Diabetes Brother   . Heart disease Brother 30       A-Fib. Before age 52  . Hyperlipidemia Brother   . Hypertension Brother   . Cancer Sister        Breast  . Hyperlipidemia Sister   . Hypertension Sister   . Hypertension Son   . Arthritis Other        GRANDMOTHER  . Hypertension Other        OTHER FAMILY MEMBERS    SOCIAL HISTORY Social History   Tobacco Use  . Smoking status: Former Smoker    Packs/day: 2.00    Years: 35.00    Pack years: 70.00    Types: Cigarettes    Quit date: 10/28/2011    Years since quitting: 8.9  . Smokeless tobacco: Never Used  Vaping Use  . Vaping Use: Some days  . Substances: Nicotine  Substance Use Topics  . Alcohol use: Not Currently    Comment: "not in a long time"  . Drug use: Never         OPHTHALMIC EXAM: Base Eye Exam    Visual Acuity (ETDRS)      Right Left   Dist [place] 20/60 +2 20/20 -2   Dist ph [place] 20/50 -  1        Tonometry (Tonopen, 3:27 PM)      Right Left   Pressure 12 14       Pupils      Pupils Dark Light Shape React APD   Right PERRL 3 2 Round Brisk None   Left PERRL 3 2 Round Brisk None       Visual Fields (Counting fingers)      Left Right    Full Full       Extraocular Movement      Right Left    Full Full       Neuro/Psych    Oriented x3: Yes   Mood/Affect: Normal       Dilation    Right eye: 1.0% Mydriacyl, 2.5% Phenylephrine @ 3:27 PM        Slit Lamp and Fundus Exam    External Exam      Right Left   External Normal Normal       Slit Lamp Exam      Right Left   Lids/Lashes Normal Normal   Conjunctiva/Sclera White and quiet White and quiet   Cornea Clear Clear   Anterior Chamber Deep and quiet Deep and quiet   Iris Round and reactive Round and reactive   Lens Posterior chamber intraocular lens Posterior chamber intraocular lens   Anterior Vitreous Normal Normal       Fundus Exam       Right Left   Posterior Vitreous Normal    Disc Normal    C/D Ratio 0.4    Macula Exudates, Microaneurysms, no clinically apparent clinically significant macular edema    Vessels NPDR severe, with numerous dot blot hemorrhages, VCABS, Irma    Periphery Normal, good PRP nasal retina           IMAGING AND PROCEDURES  Imaging and Procedures for 10/17/20  OCT, Retina - OU - Both Eyes       Right Eye Quality was good. Scan locations included subfoveal. Central Foveal Thickness: 282. Progression has improved.   Left Eye Quality was good. Scan locations included subfoveal. Central Foveal Thickness: 281. Progression has been stable.   Notes Vastly improved macular findings less CSME OD with no serous subfoveal fluid remaining as compared to 09/02/2020.  Less intraretinal fluid as well post intravitreal Avastin but also post extensive PRP to decrease vegF burden.  Will need to repeat injection Eylea OD today  And examination again in 7 weeks       Intravitreal Injection, Pharmacologic Agent - OD - Right Eye       Time Out 10/17/2020. 4:03 PM. Confirmed correct patient, procedure, site, and patient consented.   Anesthesia Topical anesthesia was used. Anesthetic medications included [place]ten 3.5%.   Procedure Preparation included 5% betadine to ocular surface, 10% betadine to eyelids, Ofloxacin . A 30 gauge needle was used.   Injection:  2 mg aflibercept Alfonse Flavors) SOLN   NDC: [id number], Lot: [id number]   Route: Intravitreal, Site: Right Eye, Waste: 0 mg  Post-op Post injection exam found visual acuity of at least counting fingers. The patient tolerated the procedure well. There were no complications. The patient received written and verbal post procedure care education. Post injection medications were not given.        Hemoglobin-hemacue, POC     Component Value Flag Ref Range Units Status   Hemoglobin 9.8      13.0 - 17.0 g/dL Final  ASSESSMENT/PLAN:  Severe nonproliferative diabetic retinopathy of right eye, with macular edema, associated with type 2 diabetes mellitus (HCC) Massive CSME documented 09/02/2020 OD with subfoveal serous fluid, serous retinal detachment, now vastly improved with combination therapy of intravitreal Eylea now also PRP x2 sessions to decrease vegF burden total in the right eye  Today repeat injection to maintain and extend interval from 6 to 7 weeks for next visit right eye  Severe nonproliferative diabetic retinopathy of left eye, with macular edema, associated with type 2 diabetes mellitus ([place]) Still no recurrence of CSME left eye we will continue to monitor      ICD-10-CM   1. Severe nonproliferative diabetic retinopathy of right eye, with macular edema, associated with type 2 diabetes mellitus (HCC)  E11.3411 OCT, Retina - OU - Both Eyes    Intravitreal Injection, Pharmacologic Agent - OD - Right Eye    aflibercept (EYLEA) SOLN 2 mg  2. Severe nonproliferative diabetic retinopathy of left eye, with macular edema, associated with type 2 diabetes mellitus ([place])  N05.3976     1.  Repeat injection intravitreal Eylea OD today and examination again in 7 weeks  2.  3.  Ophthalmic Meds Ordered this visit:  Meds ordered this encounter  Medications  . aflibercept (EYLEA) SOLN 2 mg       Return in about 7 weeks (around 12/05/2020) for dilate, OD, EYLEA OCT.  There are no Patient Instructions on file for this visit.   Explained the diagnoses, plan, and follow up with the patient and they expressed understanding.  Patient expressed understanding of the importance of proper follow up care.   Clent Demark Claudeen Leason M.D. Diseases & Surgery of the Retina and Vitreous Retina & Diabetic [place] 10/17/20     Abbreviations: M myopia (nearsighted); A astigmatism; H hyperopia (farsighted); P presbyopia; Mrx spectacle prescription;  CTL contact lenses; OD right eye; OS left eye; OU both eyes   XT exotropia; ET esotropia; PEK punctate epithelial keratitis; PEE punctate epithelial erosions; DES dry eye syndrome; MGD meibomian gland dysfunction; ATs artificial tears; PFAT's preservative free artificial tears; [place] nuclear sclerotic cataract; PSC posterior subcapsular cataract; ERM epi-retinal membrane; PVD posterior vitreous detachment; RD retinal detachment; DM diabetes mellitus; DR diabetic retinopathy; NPDR non-proliferative diabetic retinopathy; PDR proliferative diabetic retinopathy; CSME clinically significant macular edema; DME diabetic macular edema; dbh dot blot hemorrhages; CWS cotton wool spot; POAG primary open angle glaucoma; C/D cup-to-disc ratio; HVF humphrey visual field; GVF goldmann visual field; OCT optical coherence tomography; IOP intraocular pressure; BRVO Branch retinal vein occlusion; CRVO central retinal vein occlusion; CRAO central retinal artery occlusion; BRAO branch retinal artery occlusion; RT retinal tear; SB scleral buckle; PPV pars plana vitrectomy; VH Vitreous hemorrhage; PRP panretinal laser photocoagulation; IVK intravitreal kenalog; VMT vitreomacular traction; MH Macular hole;  NVD neovascularization of the disc; NVE neovascularization elsewhere; AREDS age related eye disease study; ARMD age related macular degeneration; POAG primary open angle glaucoma; EBMD epithelial/anterior basement membrane dystrophy; ACIOL anterior chamber intraocular lens; IOL intraocular lens; PCIOL posterior chamber intraocular lens; Phaco/IOL phacoemulsification with intraocular lens placement; [place] photorefractive keratectomy; LASIK laser assisted in situ keratomileusis; HTN hypertension; DM diabetes mellitus; COPD chronic obstructive pulmonary disease

## 2020-10-17 NOTE — Assessment & Plan Note (Signed)
Massive CSME documented 09/02/2020 OD with subfoveal serous fluid, serous retinal detachment, now vastly improved with combination therapy of intravitreal Eylea now also PRP x2 sessions to decrease vegF burden total in the right eye  Today repeat injection to maintain and extend interval from 6 to 7 weeks for next visit right eye

## 2020-10-17 NOTE — Assessment & Plan Note (Signed)
Still no recurrence of CSME left eye we will continue to monitor

## 2020-10-17 NOTE — Progress Notes (Signed)
Office Visit Note   Patient: [name]           Date of Birth: [birth year]           MRN: [id number] Visit Date: 10/17/2020              Requested by: [name], PA-C [address] [place],  [place] [address] PCP: [name], PA-C  Chief Complaint  Patient presents with  . Right Ankle - Follow-up    Lateral ankle ulcer       HPI: Patient presents today for follow-up on his left lower extremity.  He has been having compression wraps with Unna with an Ace wrap over the top.  He is here to have this changed today.  Assessment & Plan: Visit Diagnoses: No diagnosis found.  Plan: We will apply new wound over the Ace wrap above today.  Follow-up on Thursday.  He is wondering if he needs x-rays and I do not think this is necessary as the ulcer is stable does not probe to bone.  Certainly this can be addressed in the future  Follow-Up Instructions: No follow-ups on file.   Ortho Exam  Patient is alert, oriented, no adenopathy, well-dressed, normal affect, normal respiratory effort. Left lower extremity he has wrinkling in the skin and decreased swelling.  He does still have proximal skin breakdown which is more of blistering which is improving without any surrounding cellulitis.  The lateral ankle ulcer is about 80% fibrinous tissue does have bleeding tissue on the most inferior aspect..  Does not probe to bone.  No surrounding cellulitis   Imaging: No results found. No images are attached to the encounter.  Labs: Lab Results  Component Value Date   HGBA1C 5.7 (H) 09/16/2018   HGBA1C 7.7 (H) 05/15/2018   HGBA1C 7.1 (H) 01/07/2018   ESRSEDRATE 35 (H) 09/03/2016   ESRSEDRATE 111 (H) 05/11/2016   ESRSEDRATE 98 (H) 05/04/2016   CRP 1.2 (H) 09/03/2016   CRP 4.7 (H) 05/11/2016   CRP 8.0 (H) 05/04/2016   REPTSTATUS 11/15/2017 FINAL 11/10/2017   GRAMSTAIN  11/04/2017    ABUNDANT WBC PRESENT, PREDOMINANTLY PMN MODERATE GRAM NEGATIVE RODS MODERATE GRAM POSITIVE  COCCI Performed at [facility], [place] [address], [place], [place] [address]    CULT  11/10/2017    NO GROWTH 5 DAYS Performed at [facility] [address], [place], [place] [address]    LABORGA STAPHYLOCOCCUS AUREUS 09/26/2014     Lab Results  Component Value Date   ALBUMIN 3.7 (L) 05/30/2020   ALBUMIN 2.9 (L) 06/19/2019   ALBUMIN 3.5 05/15/2018   PREALBUMIN 19.0 05/04/2016   PREALBUMIN 25.0 12/16/2015    Lab Results  Component Value Date   MG 1.9 05/24/2018   MG 2.1 05/23/2018   MG 1.3 (L) 05/22/2018   No results found for: VD25OH  Lab Results  Component Value Date   PREALBUMIN 19.0 05/04/2016   PREALBUMIN 25.0 12/16/2015   CBC EXTENDED Latest Ref Rng & Units 10/03/2020 09/19/2020 08/29/2020  WBC 3.4 - 10.8 x10E3/uL - - -  RBC 4.14 - 5.80 x10E6/uL - - -  HGB 13.0 - 17.0 g/dL 9.5(L) 11.0(L) 11.5(L)  HCT 39.0 - 52.0 % - - -  PLT 150 - 450 x10E3/uL - - -  NEUTROABS 1.7 - 7.7 K/uL - - -  LYMPHSABS 0.7 - 4.0 K/uL - - -     There is no height or weight on file to calculate  BMI.  Orders:  No orders of the defined types were placed in this encounter.  No orders of the defined types were placed in this encounter.    Procedures: No procedures performed  Clinical Data: No additional findings.  ROS:  All other systems negative, except as noted in the HPI. Review of Systems  Objective: Vital Signs: There were no vitals taken for this visit.  Specialty Comments:  No specialty comments available.  PMFS History: Patient Active Problem List   Diagnosis Date Noted  . Carotid stenosis 06/15/2020  . Dyslipidemia 06/15/2020  . Severe nonproliferative diabetic retinopathy of right eye, with macular edema, associated with type 2 diabetes mellitus ([place]) 01/18/2020  . Severe nonproliferative diabetic retinopathy of left eye, with macular edema, associated with type 2 diabetes mellitus ([place]) 01/04/2020  . Retinal hemorrhage of right eye 01/04/2020  .  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 Pseudoclaudication 11/15/2018  . Essential hypertension 09/18/2018  . S/P BKA (below knee amputation), right ([place]) 09/18/2018  . Amputation of toe of left foot ([place]) 09/18/2018  . Peripheral artery disease ([place]) 09/18/2018  . S/P carotid endarterectomy 09/18/2018  . OSA on CPAP 09/18/2018  . Chronic back pain 09/18/2018  . Status post reversal of ileostomy 05/21/2018  . Normocytic anemia 02/14/2018  . Intra-abdominal abscess ([place]) 11/04/2017  . Chronic venous hypertension (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 ([place]) 12/11/2016  . Unilateral primary osteoarthritis, left knee 10/11/2016  . History of right below knee amputation ([place]) 07/12/2016  . Diabetic foot infection ([place]) 05/04/2016  . Type 2 diabetes mellitus with insulin therapy ([place]) 12/16/2015  . Amputated toe ([place]) 10/21/2015  . Leg edema, left 09/03/2015  . CAD (dz of distal, mid and proximal RCA with implantation of 3 overlapping drug-eluting stent,) 09/03/2015  . Chronic diastolic heart failure, NYHA class 2 ([place]) 09/03/2015  . Angina pectoris associated with type 2 diabetes mellitus ([place]) 10/28/2014  . Hyperlipidemia 08/25/2014  . Limb pain 03/20/2013  . DJD (degenerative joint disease) 09/25/2012  . Migraine 09/25/2012  . Neuropathy 09/25/2012  . Restless legs syndrome (RLS) 09/25/2012  . Chronic obstructive pulmonary disease, unspecified ([place]) 04/25/2012  . Unknown cause of morbidity or mortality 04/25/2012  . Chronic total occlusion of artery of the extremities ([place]) 04/08/2012  . Onychomycosis 02/01/2012  . Occlusion and stenosis of carotid artery without mention of cerebral infarction 06/12/2011  . GERD (gastroesophageal reflux disease) 05/08/2011  . Barrett's esophagus without dysplasia 05/08/2011  . Former tobacco use 02/01/2011   Past Medical History:  Diagnosis Date  . Carotid artery occlusion 11/10/10   LEFT CAROTID ENDARTERECTOMY  . Chronic  kidney disease   . Complication of anesthesia    BP WENT UP AT [facility] "  . COPD (chronic obstructive pulmonary disease) ([place])    pt denies this dx as of 06/01/20 - no inhaler   . Diabetes mellitus without complication ([place])   . Diverticulitis   . Diverticulosis of colon (without mention of hemorrhage)   . DJD (degenerative joint disease)    knees/hands/feet/back/neck  . Fatty liver   . Full dentures   . GERD (gastroesophageal reflux disease)   . H/O hiatal hernia   . History of blood transfusion    with a past surical procedure per patient 06/01/20  . Hyperlipidemia   . Hypertension   . Neuromuscular disorder ([place])    peripheral neuropathy  . Non-pressure chronic ulcer of other part of left foot limited to breakdown of skin ([place]) 11/12/2016  .  Osteomyelitis ([place])    left 5th metatarsal  . PAD (peripheral artery disease) ([place])    Distal aortogram June 2012. Atherectomy left popliteal artery July 2012.   . Pseudoclaudication 11/15/2018  . Sleep apnea    pt denies this dx as of 06/01/20  . Slurred speech    AS PER WIFE IN D/C NOTE 11/10/10  . 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 Unstable angina ([place]) 09/16/2018  . Wears glasses     Family History  Problem Relation Age of Onset  . Heart disease Father        Before age 30-  CAD, BPG  . Diabetes Father        Amputation  . Cancer Father        PROSTATE  . Hyperlipidemia Father   . Hypertension Father   . Heart attack Father        Triple BPG  . Varicose Veins Father   . Colon cancer Brother   . Diabetes Brother   . Heart disease Brother 52       A-Fib. Before age 66  . Hyperlipidemia Brother   . Hypertension Brother   . Cancer Sister        Breast  . Hyperlipidemia Sister   . Hypertension Sister   . Hypertension Son   . Arthritis Other        GRANDMOTHER  . Hypertension Other        OTHER FAMILY MEMBERS    Past Surgical History:  Procedure Laterality Date  . AMPUTATION  11/05/2011   Procedure: AMPUTATION RAY;  Surgeon: Wylene Simmer, MD;  Location: [facility];  Service: Orthopedics;  Laterality: Right;  Amputation of Right 4&5th Toes  . AMPUTATION Left 11/26/2012   Procedure: AMPUTATION RAY;  Surgeon: Wylene Simmer, MD;  Location: [facility];  Service: Orthopedics;  Laterality: Left;  fourth ray amputation  . AMPUTATION Right 08/27/2014   Procedure: Transmetatarsal Amputation;  Surgeon: Newt Minion, MD;  Location: [facility];  Service: Orthopedics;  Laterality: Right;  . AMPUTATION Right 01/14/2015   Procedure: AMPUTATION BELOW KNEE;  Surgeon: Newt Minion, MD;  Location: [facility];  Service: Orthopedics;  Laterality: Right;  . AMPUTATION Left 10/21/2015   Procedure: Left Foot 5th Ray Amputation;  Surgeon: Newt Minion, MD;  Location: [facility];  Service: Orthopedics;  Laterality: Left;  . ANTERIOR FUSION CERVICAL SPINE  02/06/06   C4-5, C5-6, C6-7; SURGEON DR. MAX COHEN  . AV FISTULA PLACEMENT Left 06/02/2020   Procedure: ARTERIOVENOUS (AV) FISTULA CREATION LEFT;  Surgeon: Waynetta Sandy, MD;  Location: [facility];  Service: Vascular;  Laterality: Left;  . BACK SURGERY     x 3  . BASCILIC VEIN TRANSPOSITION Left 07/21/2020   Procedure: LEFT UPPER ARM ATERIOVENOUS SUPERFISTULALIZATION;  Surgeon: Waynetta Sandy, MD;  Location: [facility];  Service: Vascular;  Laterality: Left;  . BELOW KNEE LEG AMPUTATION Right   . CARDIAC CATHETERIZATION  10/31/04   2009  . CAROTID ENDARTERECTOMY  11/10/10  . CAROTID ENDARTERECTOMY Left 11/10/2010   Subtotal occlusion of left internal carotid artery with left hemispheric transient ischemic attacks.  . CAROTID STENT    . CARPAL TUNNEL RELEASE Right 10/21/2013   Procedure: RIGHT CARPAL TUNNEL RELEASE;  Surgeon: Wynonia Sours, MD;  Location: [facility];  Service: Orthopedics;  Laterality: Right;  . CHOLECYSTECTOMY    . COLON SURGERY    . COLONOSCOPY    . COLOSTOMY REVERSAL  05/21/2018   ileostomy reversal  .  CYSTOSCOPY WITH STENT PLACEMENT Bilateral 01/13/2018   Procedure: CYSTOSCOPY  WITH BILATERAL URETERAL CATHETER PLACEMENT;  Surgeon: Ardis Hughs, MD;  Location: [facility];  Service: Urology;  Laterality: Bilateral;  . ESOPHAGEAL MANOMETRY Bilateral 07/19/2014   Procedure: ESOPHAGEAL MANOMETRY (EM);  Surgeon: Jerene Bears, MD;  Location[facility];  Service: Gastroenterology;  Laterality: Bilateral;  . EYE SURGERY Bilateral 2020   cataract  . FEMORAL ARTERY STENT     x6  . FINGER SURGERY    . FOOT SURGERY  04/25/2016    EXCISION BASE 5TH METATARSAL AND PARTIAL CUBOID LEFT FOOT  . HERNIA REPAIR     LEFT INGUINAL AND UMBILICAL REPAIRS  . HERNIA REPAIR    . I & D EXTREMITY Left 04/25/2016   Procedure: EXCISION BASE 5TH METATARSAL AND PARTIAL CUBOID LEFT FOOT;  Surgeon: Newt Minion, MD;  Location: [facility];  Service: Orthopedics;  Laterality: Left;  . ILEOSTOMY  01/13/2018   Procedure: ILEOSTOMY;  Surgeon: Clovis Riley, MD;  Location[facility];  Service: General;;  . ILEOSTOMY CLOSURE N/A 05/21/2018   Procedure: ILEOSTOMY REVERSAL ERAS PATHWAY;  Surgeon: Clovis Riley, MD;  Location: [facility];  Service: General;  Laterality: N/A;  . IR RADIOLOGIST EVAL & MGMT  11/19/2017  . IR RADIOLOGIST EVAL & MGMT  12/03/2017  . IR RADIOLOGIST EVAL & MGMT  12/18/2017  . JOINT REPLACEMENT Right 2001   Total knee  . LAMINECTOMY     X 3 LUMBAR AND X 2 CERVICAL SPINE OPERATIONS  . LAPAROSCOPIC CHOLECYSTECTOMY W/ CHOLANGIOGRAPHY  11/09/04   SURGEON DR. Luella Cook  . LEFT HEART CATH AND CORONARY ANGIOGRAPHY N/A 09/16/2018   Procedure: LEFT HEART CATH AND CORONARY ANGIOGRAPHY;  Surgeon: Nigel Mormon, MD;  Location: [facility];  Service: Cardiovascular;  Laterality: N/A;  . LEFT HEART CATHETERIZATION WITH CORONARY ANGIOGRAM N/A 10/29/2014   Procedure: LEFT HEART CATHETERIZATION WITH CORONARY ANGIOGRAM;  Surgeon: Laverda Page, MD;  Location: [facility];  Service: Cardiovascular;  Laterality: N/A;  . LIGATION OF COMPETING BRANCHES OF ARTERIOVENOUS FISTULA Left  07/21/2020   Procedure: LIGATION OF COMPETING BRANCHES OF LEFT UPPER ARM ARTERIOVENOUS FISTULA;  Surgeon: Waynetta Sandy, MD;  Location: [facility];  Service: Vascular;  Laterality: Left;  . LOWER EXTREMITY ANGIOGRAM N/A 03/19/2012   Procedure: LOWER EXTREMITY ANGIOGRAM;  Surgeon: Burnell Blanks, MD;  Location: [facility] LAB;  Service: Cardiovascular;  Laterality: N/A;  . NECK SURGERY    . PARTIAL COLECTOMY N/A 01/13/2018   Procedure: LAPAROSCOPIC ASSISTED   SIGMOID COLECTOMY ILEOSTOMY;  Surgeon: Clovis Riley, MD;  Location[facility];  Service: General;  Laterality: N/A;  . PENILE PROSTHESIS IMPLANT  08/14/05   INFRAPUBIC INSERTION OF INFLATABLE PENILE PROSTHESIS; SURGEON DR. Amalia Hailey  . PENILE PROSTHESIS IMPLANT    . PERCUTANEOUS CORONARY STENT INTERVENTION (PCI-S) Right 10/29/2014   Procedure: PERCUTANEOUS CORONARY STENT INTERVENTION (PCI-S);  Surgeon: Laverda Page, MD;  Location: [facility];  Service: Cardiovascular;  Laterality: Right;  . SHOULDER ARTHROSCOPY    . SPINE SURGERY    . TOE AMPUTATION Left   . TONSILLECTOMY    . TOTAL KNEE ARTHROPLASTY  07/2002   RIGHT KNEE ; SURGEON  DR. [name] ALSO HAD ARTHROSCOPIC RIGHT KNEE IN  10/2001  . TOTAL KNEE ARTHROPLASTY    . ULNAR NERVE TRANSPOSITION Right 10/21/2013   Procedure: RIGHT ELBOW  ULNAR NERVE DECOMPRESSION;  Surgeon: Wynonia Sours, MD;  Location: [facility];  Service: Orthopedics;  Laterality: Right;   Social History   Occupational History  . Occupation: Magazine features editor: UNEMPLOYED  Tobacco Use  . Smoking status: Former Smoker    Packs/day: 2.00    Years: 35.00    Pack years: 70.00    Types: Cigarettes    Quit date: 10/28/2011    Years since quitting: 8.9  . Smokeless tobacco: Never Used  Vaping Use  . Vaping Use: Some days  . Substances: Nicotine  Substance and Sexual Activity  . Alcohol use: Not Currently    Comment: "not in a long time"  . Drug use: Never  . Sexual activity: Yes     Birth control/protection: Implant    Comment: penile implant

## 2020-10-17 NOTE — Telephone Encounter (Signed)
Coralyn Mark (RN) [facility] verbal orders asking for twice a wk for a home health aide for assisting patient with bath and such. Also Coralyn Mark is asking for new wound care orders and does Coralyn Mark Investment banker, corporate) need to see patient this week. Terry phone number is [phone] and if unable to answer leave a detailed message on secure line.

## 2020-10-17 NOTE — Telephone Encounter (Signed)
I called and sw HHN to advise verbal ok for HHA and that pt will come back into the office for dressing change on Thursday HHN can pick back up to once a week changes next week. And will alternate one HHN and one office change so we can keep a close eye on it and then can goe back to twice a week for [facility] will call with any other questions.

## 2020-10-18 ENCOUNTER — Ambulatory Visit: Payer: 59 | Admitting: Orthopedic Surgery

## 2020-10-20 ENCOUNTER — Ambulatory Visit (INDEPENDENT_AMBULATORY_CARE_PROVIDER_SITE_OTHER): Payer: 59 | Admitting: Orthopedic Surgery

## 2020-10-20 DIAGNOSIS — L97221 Non-pressure chronic ulcer of left calf limited to breakdown of skin: Secondary | ICD-10-CM

## 2020-10-20 DIAGNOSIS — R600 Localized edema: Secondary | ICD-10-CM

## 2020-10-24 ENCOUNTER — Encounter (INDEPENDENT_AMBULATORY_CARE_PROVIDER_SITE_OTHER): Payer: 59 | Admitting: Ophthalmology

## 2020-10-24 ENCOUNTER — Encounter: Payer: Self-pay | Admitting: Orthopedic Surgery

## 2020-10-24 NOTE — Progress Notes (Signed)
Office Visit Note   Patient: BENETT SWOYER           Date of Birth: January 05, 1956           MRN: [id number] Visit Date: 10/20/2020              Requested by: [name], PA-C [id number] [place],  [place] [address] PCP: [name], PA-C  Chief Complaint  Patient presents with  . Right Ankle - Wound Check    Profore Dressing       HPI: Patient is a 65 year old gentleman who presents in follow-up for chronic left ankle ulcer.  He is currently been undergoing compression wraps he is on doxycycline.  Assessment & Plan: Visit Diagnoses:  1. Skin ulcer of calf, left, limited to breakdown of skin ([place])     Plan: Will transition to home health nursing for them to provide biweekly dressing changes with compression.  We will apply silver cell plus wound and Dynaflex wrap today.  Follow-Up Instructions: Return in about 2 weeks (around 11/03/2020).   Ortho Exam  Patient is alert, oriented, no adenopathy, well-dressed, normal affect, normal respiratory effort. Examination patient has a ulcer over the left ankle this was debrided after debridement the ulcer is 3 x 5 cm in diameter 1 cm deep this has 75% healthy granulation tissue 25% fibrinous tissue.  There is still massive pitting edema in the leg.  Imaging: No results found. No images are attached to the encounter.  Labs: Lab Results  Component Value Date   HGBA1C 5.7 (H) 09/16/2018   HGBA1C 7.7 (H) 05/15/2018   HGBA1C 7.1 (H) 01/07/2018   ESRSEDRATE 35 (H) 09/03/2016   ESRSEDRATE 111 (H) 05/11/2016   ESRSEDRATE 98 (H) 05/04/2016   CRP 1.2 (H) 09/03/2016   CRP 4.7 (H) 05/11/2016   CRP 8.0 (H) 05/04/2016   REPTSTATUS 11/15/2017 FINAL 11/10/2017   GRAMSTAIN  11/04/2017    ABUNDANT WBC PRESENT, PREDOMINANTLY PMN MODERATE GRAM NEGATIVE RODS MODERATE GRAM POSITIVE COCCI Performed at [facility], [place] [address], [place], [place] [address]    CULT  11/10/2017    NO GROWTH 5 DAYS Performed at [facility] [address] [place], [place] [address]    LABORGA STAPHYLOCOCCUS AUREUS 09/26/2014     Lab Results  Component Value Date   ALBUMIN 3.7 (L) 05/30/2020   ALBUMIN 2.9 (L) 06/19/2019   ALBUMIN 3.5 05/15/2018   PREALBUMIN 19.0 05/04/2016   PREALBUMIN 25.0 12/16/2015    Lab Results  Component Value Date   MG 1.9 05/24/2018   MG 2.1 05/23/2018   MG 1.3 (L) 05/22/2018   No results found for: VD25OH  Lab Results  Component Value Date   PREALBUMIN 19.0 05/04/2016   PREALBUMIN 25.0 12/16/2015   CBC EXTENDED Latest Ref Rng & Units 10/17/2020 10/03/2020 09/19/2020  WBC 3.4 - 10.8 x10E3/uL - - -  RBC 4.14 - 5.80 x10E6/uL - - -  HGB 13.0 - 17.0 g/dL 9.8(L) 9.5(L) 11.0(L)  HCT 39.0 - 52.0 % - - -  PLT 150 - 450 x10E3/uL - - -  NEUTROABS 1.7 - 7.7 K/uL - - -  LYMPHSABS 0.7 - 4.0 K/uL - - -     There is no height or weight on file to calculate BMI.  Orders:  No orders of the defined types were placed in this encounter.  No orders of the defined types were placed in this encounter.    Procedures: No procedures  performed  Clinical Data: No additional findings.  ROS:  All other systems negative, except as noted in the HPI. Review of Systems  Objective: Vital Signs: There were no vitals taken for this visit.  Specialty Comments:  No specialty comments available.  PMFS History: Patient Active Problem List   Diagnosis Date Noted  . Carotid stenosis 06/15/2020  . Dyslipidemia 06/15/2020  . Severe nonproliferative diabetic retinopathy of right eye, with macular edema, associated with type 2 diabetes mellitus ([place]) 01/18/2020  . Severe nonproliferative diabetic retinopathy of left eye, with macular edema, associated with type 2 diabetes mellitus ([place]) 01/04/2020  . Retinal hemorrhage of right eye 01/04/2020  . 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 Pseudoclaudication 11/15/2018  . Essential hypertension 09/18/2018  . S/P BKA (below knee amputation), right ([place]) 09/18/2018   . Amputation of toe of left foot ([place]) 09/18/2018  . Peripheral artery disease ([place]) 09/18/2018  . S/P carotid endarterectomy 09/18/2018  . OSA on CPAP 09/18/2018  . Chronic back pain 09/18/2018  . Status post reversal of ileostomy 05/21/2018  . Normocytic anemia 02/14/2018  . Intra-abdominal abscess ([place]) 11/04/2017  . Chronic venous hypertension (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 ([place]) 12/11/2016  . Unilateral primary osteoarthritis, left knee 10/11/2016  . History of right below knee amputation ([place]) 07/12/2016  . Diabetic foot infection ([place]) 05/04/2016  . Type 2 diabetes mellitus with insulin therapy ([place]) 12/16/2015  . Amputated toe ([place]) 10/21/2015  . Leg edema, left 09/03/2015  . CAD (dz of distal, mid and proximal RCA with implantation of 3 overlapping drug-eluting stent,) 09/03/2015  . Chronic diastolic heart failure, NYHA class 2 ([place]) 09/03/2015  . Angina pectoris associated with type 2 diabetes mellitus ([place]) 10/28/2014  . Hyperlipidemia 08/25/2014  . Limb pain 03/20/2013  . DJD (degenerative joint disease) 09/25/2012  . Migraine 09/25/2012  . Neuropathy 09/25/2012  . Restless legs syndrome (RLS) 09/25/2012  . Chronic obstructive pulmonary disease, unspecified ([place]) 04/25/2012  . Unknown cause of morbidity or mortality 04/25/2012  . Chronic total occlusion of artery of the extremities ([place]) 04/08/2012  . Onychomycosis 02/01/2012  . Occlusion and stenosis of carotid artery without mention of cerebral infarction 06/12/2011  . GERD (gastroesophageal reflux disease) 05/08/2011  . Barrett's esophagus without dysplasia 05/08/2011  . Former tobacco use 02/01/2011   Past Medical History:  Diagnosis Date  . Carotid artery occlusion 11/10/10   LEFT CAROTID ENDARTERECTOMY  . Chronic kidney disease   . Complication of anesthesia    BP WENT UP AT [facility] "  . COPD (chronic obstructive pulmonary disease) ([place])    pt denies this dx as of 06/01/20 - no  inhaler   . Diabetes mellitus without complication ([place])   . Diverticulitis   . Diverticulosis of colon (without mention of hemorrhage)   . DJD (degenerative joint disease)    knees/hands/feet/back/neck  . Fatty liver   . Full dentures   . GERD (gastroesophageal reflux disease)   . H/O hiatal hernia   . History of blood transfusion    with a past surical procedure per patient 06/01/20  . Hyperlipidemia   . Hypertension   . Neuromuscular disorder ([place])    peripheral neuropathy  . Non-pressure chronic ulcer of other part of left foot limited to breakdown of skin ([place]) 11/12/2016  . Osteomyelitis ([place])    left 5th metatarsal  . PAD (peripheral artery disease) ([place])    Distal aortogram June 2012. Atherectomy left popliteal artery July 2012.   . Pseudoclaudication 11/15/2018  .  Sleep apnea    pt denies this dx as of 06/01/20  . Slurred speech    AS PER WIFE IN D/C NOTE 11/10/10  . 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 Unstable angina ([place]) 09/16/2018  . Wears glasses     Family History  Problem Relation Age of Onset  . Heart disease Father        Before age 52-  CAD, BPG  . Diabetes Father        Amputation  . Cancer Father        PROSTATE  . Hyperlipidemia Father   . Hypertension Father   . Heart attack Father        Triple BPG  . Varicose Veins Father   . Colon cancer Brother   . Diabetes Brother   . Heart disease Brother 30       A-Fib. Before age 33  . Hyperlipidemia Brother   . Hypertension Brother   . Cancer Sister        Breast  . Hyperlipidemia Sister   . Hypertension Sister   . Hypertension Son   . Arthritis Other        GRANDMOTHER  . Hypertension Other        OTHER FAMILY MEMBERS    Past Surgical History:  Procedure Laterality Date  . AMPUTATION  11/05/2011   Procedure: AMPUTATION RAY;  Surgeon: Wylene Simmer, MD;  Location: [facility];  Service: Orthopedics;  Laterality: Right;  Amputation of Right 4&5th Toes  . AMPUTATION Left 11/26/2012   Procedure: AMPUTATION RAY;   Surgeon: Wylene Simmer, MD;  Location: [facility];  Service: Orthopedics;  Laterality: Left;  fourth ray amputation  . AMPUTATION Right 08/27/2014   Procedure: Transmetatarsal Amputation;  Surgeon: Newt Minion, MD;  Location: [facility];  Service: Orthopedics;  Laterality: Right;  . AMPUTATION Right 01/14/2015   Procedure: AMPUTATION BELOW KNEE;  Surgeon: Newt Minion, MD;  Location: [facility];  Service: Orthopedics;  Laterality: Right;  . AMPUTATION Left 10/21/2015   Procedure: Left Foot 5th Ray Amputation;  Surgeon: Newt Minion, MD;  Location: [facility];  Service: Orthopedics;  Laterality: Left;  . ANTERIOR FUSION CERVICAL SPINE  02/06/06   C4-5, C5-6, C6-7; SURGEON DR. MAX COHEN  . AV FISTULA PLACEMENT Left 06/02/2020   Procedure: ARTERIOVENOUS (AV) FISTULA CREATION LEFT;  Surgeon: Waynetta Sandy, MD;  Location: [facility];  Service: Vascular;  Laterality: Left;  . BACK SURGERY     x 3  . BASCILIC VEIN TRANSPOSITION Left 07/21/2020   Procedure: LEFT UPPER ARM ATERIOVENOUS SUPERFISTULALIZATION;  Surgeon: Waynetta Sandy, MD;  Location: [facility];  Service: Vascular;  Laterality: Left;  . BELOW KNEE LEG AMPUTATION Right   . CARDIAC CATHETERIZATION  10/31/04   2009  . CAROTID ENDARTERECTOMY  11/10/10  . CAROTID ENDARTERECTOMY Left 11/10/2010   Subtotal occlusion of left internal carotid artery with left hemispheric transient ischemic attacks.  . CAROTID STENT    . CARPAL TUNNEL RELEASE Right 10/21/2013   Procedure: RIGHT CARPAL TUNNEL RELEASE;  Surgeon: Wynonia Sours, MD;  Location: [facility];  Service: Orthopedics;  Laterality: Right;  . CHOLECYSTECTOMY    . COLON SURGERY    . COLONOSCOPY    . COLOSTOMY REVERSAL  05/21/2018   ileostomy reversal  . CYSTOSCOPY WITH STENT PLACEMENT Bilateral 01/13/2018   Procedure: CYSTOSCOPY WITH BILATERAL URETERAL CATHETER PLACEMENT;  Surgeon: Ardis Hughs, MD;  Location: [facility];  Service: Urology;  Laterality: Bilateral;  .  ESOPHAGEAL MANOMETRY  Bilateral 07/19/2014   Procedure: ESOPHAGEAL MANOMETRY (EM);  Surgeon: Jerene Bears, MD;  Location[facility];  Service: Gastroenterology;  Laterality: Bilateral;  . EYE SURGERY Bilateral 2020   cataract  . FEMORAL ARTERY STENT     x6  . FINGER SURGERY    . FOOT SURGERY  04/25/2016    EXCISION BASE 5TH METATARSAL AND PARTIAL CUBOID LEFT FOOT  . HERNIA REPAIR     LEFT INGUINAL AND UMBILICAL REPAIRS  . HERNIA REPAIR    . I & D EXTREMITY Left 04/25/2016   Procedure: EXCISION BASE 5TH METATARSAL AND PARTIAL CUBOID LEFT FOOT;  Surgeon: Newt Minion, MD;  Location: [facility];  Service: Orthopedics;  Laterality: Left;  . ILEOSTOMY  01/13/2018   Procedure: ILEOSTOMY;  Surgeon: Clovis Riley, MD;  Location[facility];  Service: General;;  . ILEOSTOMY CLOSURE N/A 05/21/2018   Procedure: ILEOSTOMY REVERSAL ERAS PATHWAY;  Surgeon: Clovis Riley, MD;  Location: [facility];  Service: General;  Laterality: N/A;  . IR RADIOLOGIST EVAL & MGMT  11/19/2017  . IR RADIOLOGIST EVAL & MGMT  12/03/2017  . IR RADIOLOGIST EVAL & MGMT  12/18/2017  . JOINT REPLACEMENT Right 2001   Total knee  . LAMINECTOMY     X 3 LUMBAR AND X 2 CERVICAL SPINE OPERATIONS  . LAPAROSCOPIC CHOLECYSTECTOMY W/ CHOLANGIOGRAPHY  11/09/04   SURGEON DR. Luella Cook  . LEFT HEART CATH AND CORONARY ANGIOGRAPHY N/A 09/16/2018   Procedure: LEFT HEART CATH AND CORONARY ANGIOGRAPHY;  Surgeon: Nigel Mormon, MD;  Location: [facility];  Service: Cardiovascular;  Laterality: N/A;  . LEFT HEART CATHETERIZATION WITH CORONARY ANGIOGRAM N/A 10/29/2014   Procedure: LEFT HEART CATHETERIZATION WITH CORONARY ANGIOGRAM;  Surgeon: Laverda Page, MD;  Location[facility];  Service: Cardiovascular;  Laterality: N/A;  . LIGATION OF COMPETING BRANCHES OF ARTERIOVENOUS FISTULA Left 07/21/2020   Procedure: LIGATION OF COMPETING BRANCHES OF LEFT UPPER ARM ARTERIOVENOUS FISTULA;  Surgeon: Waynetta Sandy, MD;  Location: [facility];  Service:  Vascular;  Laterality: Left;  . LOWER EXTREMITY ANGIOGRAM N/A 03/19/2012   Procedure: LOWER EXTREMITY ANGIOGRAM;  Surgeon: Burnell Blanks, MD;  Location[facility];  Service: Cardiovascular;  Laterality: N/A;  . NECK SURGERY    . PARTIAL COLECTOMY N/A 01/13/2018   Procedure: LAPAROSCOPIC ASSISTED   SIGMOID COLECTOMY ILEOSTOMY;  Surgeon: Clovis Riley, MD;  Location[facility];  Service: General;  Laterality: N/A;  . PENILE PROSTHESIS IMPLANT  08/14/05   INFRAPUBIC INSERTION OF INFLATABLE PENILE PROSTHESIS; SURGEON DR. Amalia Hailey  . PENILE PROSTHESIS IMPLANT    . PERCUTANEOUS CORONARY STENT INTERVENTION (PCI-S) Right 10/29/2014   Procedure: PERCUTANEOUS CORONARY STENT INTERVENTION (PCI-S);  Surgeon: Laverda Page, MD;  Location[facility];  Service: Cardiovascular;  Laterality: Right;  . SHOULDER ARTHROSCOPY    . SPINE SURGERY    . TOE AMPUTATION Left   . TONSILLECTOMY    . TOTAL KNEE ARTHROPLASTY  07/2002   RIGHT KNEE ; SURGEON  DR. [name] ALSO HAD ARTHROSCOPIC RIGHT KNEE IN  10/2001  . TOTAL KNEE ARTHROPLASTY    . ULNAR NERVE TRANSPOSITION Right 10/21/2013   Procedure: RIGHT ELBOW  ULNAR NERVE DECOMPRESSION;  Surgeon: Wynonia Sours, MD;  Location: [facility];  Service: Orthopedics;  Laterality: Right;   Social History   Occupational History  . Occupation: Magazine features editor: UNEMPLOYED  Tobacco Use  . Smoking status: Former Smoker  Packs/day: 2.00    Years: 35.00    Pack years: 70.00    Types: Cigarettes    Quit date: 10/28/2011    Years since quitting: 8.9  . Smokeless tobacco: Never Used  Vaping Use  . Vaping Use: Some days  . Substances: Nicotine  Substance and Sexual Activity  . Alcohol use: Not Currently    Comment: "not in a long time"  . Drug use: Never  . Sexual activity: Yes    Birth control/protection: Implant    Comment: penile implant

## 2020-10-28 ENCOUNTER — Telehealth: Payer: Self-pay

## 2020-10-28 NOTE — Telephone Encounter (Signed)
[name] called she is requesting a call back to clarify nv frequencies she stated the patient told her 2 weeks but she only received a order for 1 week call back:[phone]

## 2020-10-31 ENCOUNTER — Telehealth: Payer: Self-pay

## 2020-10-31 ENCOUNTER — Encounter (HOSPITAL_COMMUNITY): Payer: 59

## 2020-10-31 ENCOUNTER — Ambulatory Visit: Payer: 59 | Admitting: Orthopedic Surgery

## 2020-10-31 NOTE — Telephone Encounter (Signed)
Carrie from [facility] [employer] home health called she stated she called last week to clarify orders and she never received a call back she would like a call back she needs to know if nurses need to see the patient once a week or twice a week call [phone]

## 2020-10-31 NOTE — Telephone Encounter (Signed)
Called and lm on vm to advise twice a week dressing changes

## 2020-10-31 NOTE — Telephone Encounter (Signed)
Called and lm on vm to advise twice a week HHN dressing changes and pt will follow up in the office on 11/07/20

## 2020-11-01 ENCOUNTER — Telehealth: Payer: Self-pay | Admitting: Radiology

## 2020-11-01 NOTE — Telephone Encounter (Signed)
Patients wife Mickel Baas, called and states that the [facility] only came out 1 time last week, they state that they had not rec'd new orders for 2 times per week.  I looked at the messages that were taken, I advised Mickel Baas that according to the messages that Autumn did call them back @ 1:30pm on 10/31/20 and lmom for the to advise the dressing changes 2 times per week.  She states that she will call them and make sure that they got the info.  I advised that it was Morey Hummingbird that called our office.

## 2020-11-07 ENCOUNTER — Encounter: Payer: Self-pay | Admitting: Orthopedic Surgery

## 2020-11-07 ENCOUNTER — Ambulatory Visit (INDEPENDENT_AMBULATORY_CARE_PROVIDER_SITE_OTHER): Payer: 59 | Admitting: Orthopedic Surgery

## 2020-11-07 DIAGNOSIS — L97221 Non-pressure chronic ulcer of left calf limited to breakdown of skin: Secondary | ICD-10-CM

## 2020-11-07 DIAGNOSIS — Z89511 Acquired absence of right leg below knee: Secondary | ICD-10-CM | POA: Diagnosis not present

## 2020-11-07 NOTE — Progress Notes (Signed)
Office Visit Note   Patient: [name]           Date of Birth: 24-Oct-1955           MRN: [id number] Visit Date: 11/07/2020              Requested by: [name], PA-C [address] [place],  [place] [address] PCP: [name], PA-C  Chief Complaint  Patient presents with  . Left Ankle - Follow-up    Chronic left ankle ulcer       HPI: Patient is a 65 year old gentleman who is seen in follow-up for lateral malleolar ulcer left leg he is status post a right transtibial amputation.  Assessment & Plan: Visit Diagnoses:  1. Skin ulcer of calf, left, limited to breakdown of skin ([place])   2. History of right below knee amputation ([place])     Plan: Wound bed has 100% granulation tissue we will apply a wrap Thursday and Monday with packing the wound open with silver cell.  Follow-Up Instructions: Return in about 1 week (around 11/14/2020) for Follow-up this Thursday and next Monday.   Ortho Exam  Patient is alert, oriented, no adenopathy, well-dressed, normal affect, normal respiratory effort. Examination there is fibrinous tissue at the base of the wound after debridement with a 4 x 4 gauze there was 100% healthy granulation tissue in the wound bed there is no exposed bone or tendon no cellulitis no odor no drainage.  The granulation tissue was touched with silver nitrate compression was held.  The wound is 3 cm in diameter and 1 cm deep.  He does have pitting edema in the left lower extremity the ulcer over the proximal tibia has essentially completely healed the swelling is resolving in his knee he has been using an Ace wrap over the knee as well.  Patient is currently on doxycycline.  Imaging: No results found. No images are attached to the encounter.  Labs: Lab Results  Component Value Date   HGBA1C 5.7 (H) 09/16/2018   HGBA1C 7.7 (H) 05/15/2018   HGBA1C 7.1 (H) 01/07/2018   ESRSEDRATE 35 (H) 09/03/2016   ESRSEDRATE 111 (H) 05/11/2016   ESRSEDRATE 98 (H) 05/04/2016    CRP 1.2 (H) 09/03/2016   CRP 4.7 (H) 05/11/2016   CRP 8.0 (H) 05/04/2016   REPTSTATUS 11/15/2017 FINAL 11/10/2017   GRAMSTAIN  11/04/2017    ABUNDANT WBC PRESENT, PREDOMINANTLY PMN MODERATE GRAM NEGATIVE RODS MODERATE GRAM POSITIVE COCCI Performed at [facility], [place] [address], [place], [place] [address]    CULT  11/10/2017    NO GROWTH 5 DAYS Performed at [facility] [address] [place], [place] [address]    LABORGA STAPHYLOCOCCUS AUREUS 09/26/2014     Lab Results  Component Value Date   ALBUMIN 3.7 (L) 05/30/2020   ALBUMIN 2.9 (L) 06/19/2019   ALBUMIN 3.5 05/15/2018   PREALBUMIN 19.0 05/04/2016   PREALBUMIN 25.0 12/16/2015    Lab Results  Component Value Date   MG 1.9 05/24/2018   MG 2.1 05/23/2018   MG 1.3 (L) 05/22/2018   No results found for: VD25OH  Lab Results  Component Value Date   PREALBUMIN 19.0 05/04/2016   PREALBUMIN 25.0 12/16/2015   CBC EXTENDED Latest Ref Rng & Units 10/17/2020 10/03/2020 09/19/2020  WBC 3.4 - 10.8 x10E3/uL - - -  RBC 4.14 - 5.80 x10E6/uL - - -  HGB 13.0 - 17.0 g/dL 9.8(L) 9.5(L) 11.0(L)  HCT 39.0 - 52.0 % - - -  PLT 150 - 450 x10E3/uL - - -  NEUTROABS 1.7 - 7.7 K/uL - - -  LYMPHSABS 0.7 - 4.0 K/uL - - -     There is no height or weight on file to calculate BMI.  Orders:  No orders of the defined types were placed in this encounter.  No orders of the defined types were placed in this encounter.    Procedures: No procedures performed  Clinical Data: No additional findings.  ROS:  All other systems negative, except as noted in the HPI. Review of Systems  Objective: Vital Signs: There were no vitals taken for this visit.  Specialty Comments:  No specialty comments available.  PMFS History: Patient Active Problem List   Diagnosis Date Noted  . Carotid stenosis 06/15/2020  . Dyslipidemia 06/15/2020  . Severe nonproliferative diabetic retinopathy of right eye, with macular edema, associated  with type 2 diabetes mellitus ([place]) 01/18/2020  . Severe nonproliferative diabetic retinopathy of left eye, with macular edema, associated with type 2 diabetes mellitus ([place]) 01/04/2020  . Retinal hemorrhage of right eye 01/04/2020  . 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 Pseudoclaudication 11/15/2018  . Essential hypertension 09/18/2018  . S/P BKA (below knee amputation), right ([place]) 09/18/2018  . Amputation of toe of left foot ([place]) 09/18/2018  . Peripheral artery disease ([place]) 09/18/2018  . S/P carotid endarterectomy 09/18/2018  . OSA on CPAP 09/18/2018  . Chronic back pain 09/18/2018  . Status post reversal of ileostomy 05/21/2018  . Normocytic anemia 02/14/2018  . Intra-abdominal abscess ([place]) 11/04/2017  . Chronic venous hypertension (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 ([place]) 12/11/2016  . Unilateral primary osteoarthritis, left knee 10/11/2016  . History of right below knee amputation ([place]) 07/12/2016  . Diabetic foot infection ([place]) 05/04/2016  . Type 2 diabetes mellitus with insulin therapy ([place]) 12/16/2015  . Amputated toe ([place]) 10/21/2015  . Leg edema, left 09/03/2015  . CAD (dz of distal, mid and proximal RCA with implantation of 3 overlapping drug-eluting stent,) 09/03/2015  . Chronic diastolic heart failure, NYHA class 2 ([place]) 09/03/2015  . Angina pectoris associated with type 2 diabetes mellitus ([place]) 10/28/2014  . Hyperlipidemia 08/25/2014  . Limb pain 03/20/2013  . DJD (degenerative joint disease) 09/25/2012  . Migraine 09/25/2012  . Neuropathy 09/25/2012  . Restless legs syndrome (RLS) 09/25/2012  . Chronic obstructive pulmonary disease, unspecified ([place]) 04/25/2012  . Unknown cause of morbidity or mortality 04/25/2012  . Chronic total occlusion of artery of the extremities ([place]) 04/08/2012  . Onychomycosis 02/01/2012  . Occlusion and stenosis of carotid artery without mention of cerebral infarction 06/12/2011  . GERD (gastroesophageal  reflux disease) 05/08/2011  . Barrett's esophagus without dysplasia 05/08/2011  . Former tobacco use 02/01/2011   Past Medical History:  Diagnosis Date  . Carotid artery occlusion 11/10/10   LEFT CAROTID ENDARTERECTOMY  . Chronic kidney disease   . Complication of anesthesia    BP WENT UP AT [facility] "  . COPD (chronic obstructive pulmonary disease) ([place])    pt denies this dx as of 06/01/20 - no inhaler   . Diabetes mellitus without complication ([place])   . Diverticulitis   . Diverticulosis of colon (without mention of hemorrhage)   . DJD (degenerative joint disease)    knees/hands/feet/back/neck  . Fatty liver   . Full dentures   . GERD (gastroesophageal reflux disease)   . H/O hiatal hernia   . History of blood transfusion    with a past surical procedure per  patient 06/01/20  . Hyperlipidemia   . Hypertension   . Neuromuscular disorder ([place])    peripheral neuropathy  . Non-pressure chronic ulcer of other part of left foot limited to breakdown of skin ([place]) 11/12/2016  . Osteomyelitis ([place])    left 5th metatarsal  . PAD (peripheral artery disease) ([place])    Distal aortogram June 2012. Atherectomy left popliteal artery July 2012.   . Pseudoclaudication 11/15/2018  . Sleep apnea    pt denies this dx as of 06/01/20  . Slurred speech    AS PER WIFE IN D/C NOTE 11/10/10  . 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 Unstable angina ([place]) 09/16/2018  . Wears glasses     Family History  Problem Relation Age of Onset  . Heart disease Father        Before age 73-  CAD, BPG  . Diabetes Father        Amputation  . Cancer Father        PROSTATE  . Hyperlipidemia Father   . Hypertension Father   . Heart attack Father        Triple BPG  . Varicose Veins Father   . Colon cancer Brother   . Diabetes Brother   . Heart disease Brother 37       A-Fib. Before age 46  . Hyperlipidemia Brother   . Hypertension Brother   . Cancer Sister        Breast  . Hyperlipidemia Sister   . Hypertension Sister   .  Hypertension Son   . Arthritis Other        GRANDMOTHER  . Hypertension Other        OTHER FAMILY MEMBERS    Past Surgical History:  Procedure Laterality Date  . AMPUTATION  11/05/2011   Procedure: AMPUTATION RAY;  Surgeon: Wylene Simmer, MD;  Location: [facility];  Service: Orthopedics;  Laterality: Right;  Amputation of Right 4&5th Toes  . AMPUTATION Left 11/26/2012   Procedure: AMPUTATION RAY;  Surgeon: Wylene Simmer, MD;  Location: [facility];  Service: Orthopedics;  Laterality: Left;  fourth ray amputation  . AMPUTATION Right 08/27/2014   Procedure: Transmetatarsal Amputation;  Surgeon: Newt Minion, MD;  Location: [facility];  Service: Orthopedics;  Laterality: Right;  . AMPUTATION Right 01/14/2015   Procedure: AMPUTATION BELOW KNEE;  Surgeon: Newt Minion, MD;  Location: [facility];  Service: Orthopedics;  Laterality: Right;  . AMPUTATION Left 10/21/2015   Procedure: Left Foot 5th Ray Amputation;  Surgeon: Newt Minion, MD;  Location: [facility];  Service: Orthopedics;  Laterality: Left;  . ANTERIOR FUSION CERVICAL SPINE  02/06/06   C4-5, C5-6, C6-7; SURGEON DR. MAX COHEN  . AV FISTULA PLACEMENT Left 06/02/2020   Procedure: ARTERIOVENOUS (AV) FISTULA CREATION LEFT;  Surgeon: Waynetta Sandy, MD;  Location: [facility];  Service: Vascular;  Laterality: Left;  . BACK SURGERY     x 3  . BASCILIC VEIN TRANSPOSITION Left 07/21/2020   Procedure: LEFT UPPER ARM ATERIOVENOUS SUPERFISTULALIZATION;  Surgeon: Waynetta Sandy, MD;  Location: [facility];  Service: Vascular;  Laterality: Left;  . BELOW KNEE LEG AMPUTATION Right   . CARDIAC CATHETERIZATION  10/31/04   2009  . CAROTID ENDARTERECTOMY  11/10/10  . CAROTID ENDARTERECTOMY Left 11/10/2010   Subtotal occlusion of left internal carotid artery with left hemispheric transient ischemic attacks.  . CAROTID STENT    . CARPAL TUNNEL RELEASE Right 10/21/2013   Procedure: RIGHT CARPAL TUNNEL RELEASE;  Surgeon: Dillard Essex  Fredna Dow, MD;  Location: [facility];   Service: Orthopedics;  Laterality: Right;  . CHOLECYSTECTOMY    . COLON SURGERY    . COLONOSCOPY    . COLOSTOMY REVERSAL  05/21/2018   ileostomy reversal  . CYSTOSCOPY WITH STENT PLACEMENT Bilateral 01/13/2018   Procedure: CYSTOSCOPY WITH BILATERAL URETERAL CATHETER PLACEMENT;  Surgeon: Ardis Hughs, MD;  Location: [facility];  Service: Urology;  Laterality: Bilateral;  . ESOPHAGEAL MANOMETRY Bilateral 07/19/2014   Procedure: ESOPHAGEAL MANOMETRY (EM);  Surgeon: Jerene Bears, MD;  [facility];  Service: Gastroenterology;  Laterality: Bilateral;  . EYE SURGERY Bilateral 2020   cataract  . FEMORAL ARTERY STENT     x6  . FINGER SURGERY    . FOOT SURGERY  04/25/2016    EXCISION BASE 5TH METATARSAL AND PARTIAL CUBOID LEFT FOOT  . HERNIA REPAIR     LEFT INGUINAL AND UMBILICAL REPAIRS  . HERNIA REPAIR    . I & D EXTREMITY Left 04/25/2016   Procedure: EXCISION BASE 5TH METATARSAL AND PARTIAL CUBOID LEFT FOOT;  Surgeon: Newt Minion, MD;  Location: [facility];  Service: Orthopedics;  Laterality: Left;  . ILEOSTOMY  01/13/2018   Procedure: ILEOSTOMY;  Surgeon: Clovis Riley, MD;  Location[facility];  Service: General;;  . ILEOSTOMY CLOSURE N/A 05/21/2018   Procedure: ILEOSTOMY REVERSAL ERAS PATHWAY;  Surgeon: Clovis Riley, MD;  Location: [facility];  Service: General;  Laterality: N/A;  . IR RADIOLOGIST EVAL & MGMT  11/19/2017  . IR RADIOLOGIST EVAL & MGMT  12/03/2017  . IR RADIOLOGIST EVAL & MGMT  12/18/2017  . JOINT REPLACEMENT Right 2001   Total knee  . LAMINECTOMY     X 3 LUMBAR AND X 2 CERVICAL SPINE OPERATIONS  . LAPAROSCOPIC CHOLECYSTECTOMY W/ CHOLANGIOGRAPHY  11/09/04   SURGEON DR. Luella Cook  . LEFT HEART CATH AND CORONARY ANGIOGRAPHY N/A 09/16/2018   Procedure: LEFT HEART CATH AND CORONARY ANGIOGRAPHY;  Surgeon: Nigel Mormon, MD;  Location: [facility];  Service: Cardiovascular;  Laterality: N/A;  . LEFT HEART CATHETERIZATION WITH CORONARY ANGIOGRAM N/A 10/29/2014    Procedure: LEFT HEART CATHETERIZATION WITH CORONARY ANGIOGRAM;  Surgeon: Laverda Page, MD;  Location: [facility];  Service: Cardiovascular;  Laterality: N/A;  . LIGATION OF COMPETING BRANCHES OF ARTERIOVENOUS FISTULA Left 07/21/2020   Procedure: LIGATION OF COMPETING BRANCHES OF LEFT UPPER ARM ARTERIOVENOUS FISTULA;  Surgeon: Waynetta Sandy, MD;  Location: [facility];  Service: Vascular;  Laterality: Left;  . LOWER EXTREMITY ANGIOGRAM N/A 03/19/2012   Procedure: LOWER EXTREMITY ANGIOGRAM;  Surgeon: Burnell Blanks, MD;  Location: [facility] LAB;  Service: Cardiovascular;  Laterality: N/A;  . NECK SURGERY    . PARTIAL COLECTOMY N/A 01/13/2018   Procedure: LAPAROSCOPIC ASSISTED   SIGMOID COLECTOMY ILEOSTOMY;  Surgeon: Clovis Riley, MD;  Location[facility];  Service: General;  Laterality: N/A;  . PENILE PROSTHESIS IMPLANT  08/14/05   INFRAPUBIC INSERTION OF INFLATABLE PENILE PROSTHESIS; SURGEON DR. Amalia Hailey  . PENILE PROSTHESIS IMPLANT    . PERCUTANEOUS CORONARY STENT INTERVENTION (PCI-S) Right 10/29/2014   Procedure: PERCUTANEOUS CORONARY STENT INTERVENTION (PCI-S);  Surgeon: Laverda Page, MD;  Location: [facility] CATH LAB;  Service: Cardiovascular;  Laterality: Right;  . SHOULDER ARTHROSCOPY    . SPINE SURGERY    . TOE AMPUTATION Left   . TONSILLECTOMY    . TOTAL KNEE ARTHROPLASTY  07/2002   RIGHT KNEE ; SURGEON  DR. [name] ALSO  HAD ARTHROSCOPIC RIGHT KNEE IN  10/2001  . TOTAL KNEE ARTHROPLASTY    . ULNAR NERVE TRANSPOSITION Right 10/21/2013   Procedure: RIGHT ELBOW  ULNAR NERVE DECOMPRESSION;  Surgeon: Wynonia Sours, MD;  Location: [facility];  Service: Orthopedics;  Laterality: Right;   Social History   Occupational History  . Occupation: Magazine features editor: UNEMPLOYED  Tobacco Use  . Smoking status: Former Smoker    Packs/day: 2.00    Years: 35.00    Pack years: 70.00    Types: Cigarettes    Quit date: 10/28/2011    Years since quitting: 9.0  .  Smokeless tobacco: Never Used  Vaping Use  . Vaping Use: Some days  . Substances: Nicotine  Substance and Sexual Activity  . Alcohol use: Not Currently    Comment: "not in a long time"  . Drug use: Never  . Sexual activity: Yes    Birth control/protection: Implant    Comment: penile implant

## 2020-11-10 ENCOUNTER — Ambulatory Visit (INDEPENDENT_AMBULATORY_CARE_PROVIDER_SITE_OTHER): Payer: Medicare Other | Admitting: Physician Assistant

## 2020-11-10 ENCOUNTER — Encounter: Payer: Self-pay | Admitting: Orthopedic Surgery

## 2020-11-10 ENCOUNTER — Ambulatory Visit: Payer: Self-pay

## 2020-11-10 DIAGNOSIS — M25522 Pain in left elbow: Secondary | ICD-10-CM | POA: Diagnosis not present

## 2020-11-10 DIAGNOSIS — L97221 Non-pressure chronic ulcer of left calf limited to breakdown of skin: Secondary | ICD-10-CM | POA: Diagnosis not present

## 2020-11-10 NOTE — Progress Notes (Signed)
Office Visit Note   Patient: [name]           Date of Birth: 02-23-1956           MRN: [id number] Visit Date: 11/10/2020              Requested by: [name], PA-C [address] [place],  [place] [address] PCP: [name], PA-C  Chief Complaint  Patient presents with  . Left Ankle - Pain  . Right Knee - Pain      HPI: Patient presents today in follow-up for his lateral malleolus ulcer on his left leg.  He is status post right transtibial amputation.  He has been having Dynaflex placed on Mondays and Thursdays.  He overall feels well.  He is asking about having an x-ray of his left elbow as he fell on it a few weeks ago.  Has had some pain since.  Denies any numbness or tingling.  Just wants to make sure he did not break anything Assessment & Plan: Visit Diagnoses:  1. Pain in left elbow     Plan: Will Dynaflex wrap today follow-up on Monday.  With regards to the elbow we talked about wearing a compression sleeve that he does not think he can use his crutches with that told him to be careful about keeping the area clean to avoid skin breakdown and developing olecranon infected bursa  Follow-Up Instructions: No follow-ups on file.   Ortho Exam  Patient is alert, oriented, no adenopathy, well-dressed, normal affect, normal respiratory effort. Left lower extremity.  He has lateral malleolus ulcer has 75% good granulation tissue at the base some fibrinous tissue does not probe to bone.  Did try with manipulation got some better bleeding in the base.  No surrounding cellulitis or erythema.  Left elbow he does have a boggy olecranon bursa elbow range of motion is intact distal strength is intact.  No sign of infection of the bursa at this time  Imaging: No results found. No images are attached to the encounter.  Labs: Lab Results  Component Value Date   HGBA1C 5.7 (H) 09/16/2018   HGBA1C 7.7 (H) 05/15/2018   HGBA1C 7.1 (H) 01/07/2018   ESRSEDRATE 35 (H)  09/03/2016   ESRSEDRATE 111 (H) 05/11/2016   ESRSEDRATE 98 (H) 05/04/2016   CRP 1.2 (H) 09/03/2016   CRP 4.7 (H) 05/11/2016   CRP 8.0 (H) 05/04/2016   REPTSTATUS 11/15/2017 FINAL 11/10/2017   GRAMSTAIN  11/04/2017    ABUNDANT WBC PRESENT, PREDOMINANTLY PMN MODERATE GRAM NEGATIVE RODS MODERATE GRAM POSITIVE COCCI Performed at [facility], [place] [address] [place], [place] [address]    CULT  11/10/2017    NO GROWTH 5 DAYS Performed at [facility] [address], [place], [place] [address]    LABORGA STAPHYLOCOCCUS AUREUS 09/26/2014     Lab Results  Component Value Date   ALBUMIN 3.7 (L) 05/30/2020   ALBUMIN 2.9 (L) 06/19/2019   ALBUMIN 3.5 05/15/2018   PREALBUMIN 19.0 05/04/2016   PREALBUMIN 25.0 12/16/2015    Lab Results  Component Value Date   MG 1.9 05/24/2018   MG 2.1 05/23/2018   MG 1.3 (L) 05/22/2018   No results found for: VD25OH  Lab Results  Component Value Date   PREALBUMIN 19.0 05/04/2016   PREALBUMIN 25.0 12/16/2015   CBC EXTENDED Latest Ref Rng & Units 10/17/2020 10/03/2020 09/19/2020  WBC 3.4 - 10.8 x10E3/uL - - -  RBC 4.14 -  5.80 x10E6/uL - - -  HGB 13.0 - 17.0 g/dL 9.8(L) 9.5(L) 11.0(L)  HCT 39.0 - 52.0 % - - -  PLT 150 - 450 x10E3/uL - - -  NEUTROABS 1.7 - 7.7 K/uL - - -  LYMPHSABS 0.7 - 4.0 K/uL - - -     There is no height or weight on file to calculate BMI.  Orders:  Orders Placed This Encounter  Procedures  . XR Elbow 2 Views Left   No orders of the defined types were placed in this encounter.    Procedures: No procedures performed  Clinical Data: No additional findings.  ROS:  All other systems negative, except as noted in the HPI. Review of Systems  Objective: Vital Signs: There were no vitals taken for this visit.  Specialty Comments:  No specialty comments available.  PMFS History: Patient Active Problem List   Diagnosis Date Noted  . Carotid stenosis 06/15/2020  . Dyslipidemia 06/15/2020  . Severe  nonproliferative diabetic retinopathy of right eye, with macular edema, associated with type 2 diabetes mellitus ([place]) 01/18/2020  . Severe nonproliferative diabetic retinopathy of left eye, with macular edema, associated with type 2 diabetes mellitus ([place]) 01/04/2020  . Retinal hemorrhage of right eye 01/04/2020  . 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 Pseudoclaudication 11/15/2018  . Essential hypertension 09/18/2018  . S/P BKA (below knee amputation), right ([place]) 09/18/2018  . Amputation of toe of left foot ([place]) 09/18/2018  . Peripheral artery disease ([place]) 09/18/2018  . S/P carotid endarterectomy 09/18/2018  . OSA on CPAP 09/18/2018  . Chronic back pain 09/18/2018  . Status post reversal of ileostomy 05/21/2018  . Normocytic anemia 02/14/2018  . Intra-abdominal abscess ([place]) 11/04/2017  . Chronic venous hypertension (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 ([place]) 12/11/2016  . Unilateral primary osteoarthritis, left knee 10/11/2016  . History of right below knee amputation ([place]) 07/12/2016  . Diabetic foot infection ([place]) 05/04/2016  . Type 2 diabetes mellitus with insulin therapy ([place]) 12/16/2015  . Amputated toe ([place]) 10/21/2015  . Leg edema, left 09/03/2015  . CAD (dz of distal, mid and proximal RCA with implantation of 3 overlapping drug-eluting stent,) 09/03/2015  . Chronic diastolic heart failure, NYHA class 2 ([place]) 09/03/2015  . Angina pectoris associated with type 2 diabetes mellitus ([place]) 10/28/2014  . Hyperlipidemia 08/25/2014  . Limb pain 03/20/2013  . DJD (degenerative joint disease) 09/25/2012  . Migraine 09/25/2012  . Neuropathy 09/25/2012  . Restless legs syndrome (RLS) 09/25/2012  . Chronic obstructive pulmonary disease, unspecified ([place]) 04/25/2012  . Unknown cause of morbidity or mortality 04/25/2012  . Chronic total occlusion of artery of the extremities ([place]) 04/08/2012  . Onychomycosis 02/01/2012  . Occlusion and stenosis of carotid  artery without mention of cerebral infarction 06/12/2011  . GERD (gastroesophageal reflux disease) 05/08/2011  . Barrett's esophagus without dysplasia 05/08/2011  . Former tobacco use 02/01/2011   Past Medical History:  Diagnosis Date  . Carotid artery occlusion 11/10/10   LEFT CAROTID ENDARTERECTOMY  . Chronic kidney disease   . Complication of anesthesia    BP WENT UP AT [facility] "  . COPD (chronic obstructive pulmonary disease) ([place])    pt denies this dx as of 06/01/20 - no inhaler   . Diabetes mellitus without complication ([place])   . Diverticulitis   . Diverticulosis of colon (without mention of hemorrhage)   . DJD (degenerative joint disease)    knees/hands/feet/back/neck  . Fatty liver   . Full dentures   .  GERD (gastroesophageal reflux disease)   . H/O hiatal hernia   . History of blood transfusion    with a past surical procedure per patient 06/01/20  . Hyperlipidemia   . Hypertension   . Neuromuscular disorder ([place])    peripheral neuropathy  . Non-pressure chronic ulcer of other part of left foot limited to breakdown of skin ([place]) 11/12/2016  . Osteomyelitis ([place])    left 5th metatarsal  . PAD (peripheral artery disease) ([place])    Distal aortogram June 2012. Atherectomy left popliteal artery July 2012.   . Pseudoclaudication 11/15/2018  . Sleep apnea    pt denies this dx as of 06/01/20  . Slurred speech    AS PER WIFE IN D/C NOTE 11/10/10  . 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 Unstable angina ([place]) 09/16/2018  . Wears glasses     Family History  Problem Relation Age of Onset  . Heart disease Father        Before age 84-  CAD, BPG  . Diabetes Father        Amputation  . Cancer Father        PROSTATE  . Hyperlipidemia Father   . Hypertension Father   . Heart attack Father        Triple BPG  . Varicose Veins Father   . Colon cancer Brother   . Diabetes Brother   . Heart disease Brother 47       A-Fib. Before age 11  . Hyperlipidemia Brother   . Hypertension Brother   .  Cancer Sister        Breast  . Hyperlipidemia Sister   . Hypertension Sister   . Hypertension Son   . Arthritis Other        GRANDMOTHER  . Hypertension Other        OTHER FAMILY MEMBERS    Past Surgical History:  Procedure Laterality Date  . AMPUTATION  11/05/2011   Procedure: AMPUTATION RAY;  Surgeon: Wylene Simmer, MD;  Location: [facility];  Service: Orthopedics;  Laterality: Right;  Amputation of Right 4&5th Toes  . AMPUTATION Left 11/26/2012   Procedure: AMPUTATION RAY;  Surgeon: Wylene Simmer, MD;  Location: [facility];  Service: Orthopedics;  Laterality: Left;  fourth ray amputation  . AMPUTATION Right 08/27/2014   Procedure: Transmetatarsal Amputation;  Surgeon: Newt Minion, MD;  Location: [facility];  Service: Orthopedics;  Laterality: Right;  . AMPUTATION Right 01/14/2015   Procedure: AMPUTATION BELOW KNEE;  Surgeon: Newt Minion, MD;  Location: [facility];  Service: Orthopedics;  Laterality: Right;  . AMPUTATION Left 10/21/2015   Procedure: Left Foot 5th Ray Amputation;  Surgeon: Newt Minion, MD;  Location: [facility];  Service: Orthopedics;  Laterality: Left;  . ANTERIOR FUSION CERVICAL SPINE  02/06/06   C4-5, C5-6, C6-7; SURGEON DR. MAX COHEN  . AV FISTULA PLACEMENT Left 06/02/2020   Procedure: ARTERIOVENOUS (AV) FISTULA CREATION LEFT;  Surgeon: Waynetta Sandy, MD;  Location: [facility];  Service: Vascular;  Laterality: Left;  . BACK SURGERY     x 3  . BASCILIC VEIN TRANSPOSITION Left 07/21/2020   Procedure: LEFT UPPER ARM ATERIOVENOUS SUPERFISTULALIZATION;  Surgeon: Waynetta Sandy, MD;  Location: [facility];  Service: Vascular;  Laterality: Left;  . BELOW KNEE LEG AMPUTATION Right   . CARDIAC CATHETERIZATION  10/31/04   2009  . CAROTID ENDARTERECTOMY  11/10/10  . CAROTID ENDARTERECTOMY Left 11/10/2010   Subtotal occlusion of left internal carotid artery with left hemispheric transient  ischemic attacks.  . CAROTID STENT    . CARPAL TUNNEL RELEASE Right 10/21/2013   Procedure: RIGHT CARPAL  TUNNEL RELEASE;  Surgeon: Wynonia Sours, MD;  Location: [facility];  Service: Orthopedics;  Laterality: Right;  . CHOLECYSTECTOMY    . COLON SURGERY    . COLONOSCOPY    . COLOSTOMY REVERSAL  05/21/2018   ileostomy reversal  . CYSTOSCOPY WITH STENT PLACEMENT Bilateral 01/13/2018   Procedure: CYSTOSCOPY WITH BILATERAL URETERAL CATHETER PLACEMENT;  Surgeon: Ardis Hughs, MD;  Location: [facility];  Service: Urology;  Laterality: Bilateral;  . ESOPHAGEAL MANOMETRY Bilateral 07/19/2014   Procedure: ESOPHAGEAL MANOMETRY (EM);  Surgeon: Jerene Bears, MD;  [facility];  Service: Gastroenterology;  Laterality: Bilateral;  . EYE SURGERY Bilateral 2020   cataract  . FEMORAL ARTERY STENT     x6  . FINGER SURGERY    . FOOT SURGERY  04/25/2016    EXCISION BASE 5TH METATARSAL AND PARTIAL CUBOID LEFT FOOT  . HERNIA REPAIR     LEFT INGUINAL AND UMBILICAL REPAIRS  . HERNIA REPAIR    . I & D EXTREMITY Left 04/25/2016   Procedure: EXCISION BASE 5TH METATARSAL AND PARTIAL CUBOID LEFT FOOT;  Surgeon: Newt Minion, MD;  Location: [facility];  Service: Orthopedics;  Laterality: Left;  . ILEOSTOMY  01/13/2018   Procedure: ILEOSTOMY;  Surgeon: Clovis Riley, MD;  Location[facility];  Service: General;;  . ILEOSTOMY CLOSURE N/A 05/21/2018   Procedure: ILEOSTOMY REVERSAL ERAS PATHWAY;  Surgeon: Clovis Riley, MD;  Location: [facility];  Service: General;  Laterality: N/A;  . IR RADIOLOGIST EVAL & MGMT  11/19/2017  . IR RADIOLOGIST EVAL & MGMT  12/03/2017  . IR RADIOLOGIST EVAL & MGMT  12/18/2017  . JOINT REPLACEMENT Right 2001   Total knee  . LAMINECTOMY     X 3 LUMBAR AND X 2 CERVICAL SPINE OPERATIONS  . LAPAROSCOPIC CHOLECYSTECTOMY W/ CHOLANGIOGRAPHY  11/09/04   SURGEON DR. Luella Cook  . LEFT HEART CATH AND CORONARY ANGIOGRAPHY N/A 09/16/2018   Procedure: LEFT HEART CATH AND CORONARY ANGIOGRAPHY;  Surgeon: Nigel Mormon, MD;  Location: [facility];  Service: Cardiovascular;   Laterality: N/A;  . LEFT HEART CATHETERIZATION WITH CORONARY ANGIOGRAM N/A 10/29/2014   Procedure: LEFT HEART CATHETERIZATION WITH CORONARY ANGIOGRAM;  Surgeon: Laverda Page, MD;  Location: [facility] C[facility];  Service: Cardiovascular;  Laterality: N/A;  . LIGATION OF COMPETING BRANCHES OF ARTERIOVENOUS FISTULA Left 07/21/2020   Procedure: LIGATION OF COMPETING BRANCHES OF LEFT UPPER ARM ARTERIOVENOUS FISTULA;  Surgeon: Waynetta Sandy, MD;  Location: [name];  Service: Vascular;  Laterality: Left;  . LOWER EXTREMITY ANGIOGRAM N/A 03/19/2012   Procedure: LOWER EXTREMITY ANGIOGRAM;  Surgeon: Burnell Blanks, MD;  Location[facility];  Service: Cardiovascular;  Laterality: N/A;  . NECK SURGERY    . PARTIAL COLECTOMY N/A 01/13/2018   Procedure: LAPAROSCOPIC ASSISTED   SIGMOID COLECTOMY ILEOSTOMY;  Surgeon: Clovis Riley, MD;  Location[facility];  Service: General;  Laterality: N/A;  . PENILE PROSTHESIS IMPLANT  08/14/05   INFRAPUBIC INSERTION OF INFLATABLE PENILE PROSTHESIS; SURGEON DR. Amalia Hailey  . PENILE PROSTHESIS IMPLANT    . PERCUTANEOUS CORONARY STENT INTERVENTION (PCI-S) Right 10/29/2014   Procedure: PERCUTANEOUS CORONARY STENT INTERVENTION (PCI-S);  Surgeon: Laverda Page, MD;  Location: [facility] LAB;  Service: Cardiovascular;  Laterality: Right;  . SHOULDER ARTHROSCOPY    . SPINE SURGERY    .  TOE AMPUTATION Left   . TONSILLECTOMY    . TOTAL KNEE ARTHROPLASTY  07/2002   RIGHT KNEE ; SURGEON  DR. [name] ALSO HAD ARTHROSCOPIC RIGHT KNEE IN  10/2001  . TOTAL KNEE ARTHROPLASTY    . ULNAR NERVE TRANSPOSITION Right 10/21/2013   Procedure: RIGHT ELBOW  ULNAR NERVE DECOMPRESSION;  Surgeon: Wynonia Sours, MD;  Location: [facility];  Service: Orthopedics;  Laterality: Right;   Social History   Occupational History  . Occupation: Magazine features editor: UNEMPLOYED  Tobacco Use  . Smoking status: Former Smoker    Packs/day: 2.00    Years: 35.00    Pack years: 70.00     Types: Cigarettes    Quit date: 10/28/2011    Years since quitting: 9.0  . Smokeless tobacco: Never Used  Vaping Use  . Vaping Use: Some days  . Substances: Nicotine  Substance and Sexual Activity  . Alcohol use: Not Currently    Comment: "not in a long time"  . Drug use: Never  . Sexual activity: Yes    Birth control/protection: Implant    Comment: penile implant

## 2020-11-14 ENCOUNTER — Telehealth: Payer: Self-pay

## 2020-11-14 ENCOUNTER — Encounter (HOSPITAL_COMMUNITY): Payer: 59

## 2020-11-14 ENCOUNTER — Ambulatory Visit (INDEPENDENT_AMBULATORY_CARE_PROVIDER_SITE_OTHER): Payer: Medicare Other | Admitting: Physician Assistant

## 2020-11-14 ENCOUNTER — Encounter: Payer: Self-pay | Admitting: Orthopedic Surgery

## 2020-11-14 DIAGNOSIS — L97221 Non-pressure chronic ulcer of left calf limited to breakdown of skin: Secondary | ICD-10-CM

## 2020-11-14 NOTE — Progress Notes (Unsigned)
Office Visit Note   Patient: [name]           Date of Birth: 07/17/1956           MRN: [id number] Visit Date: 11/14/2020              Requested by: [name], PA-C [address] [place],  [place] [address] PCP: [name], PA-C  Chief Complaint  Patient presents with  . Left Ankle - Follow-up      HPI: Patient follows up today for his left ankle ulcer.  We have been doing Profore wraps.  He would like to begin having home health do this twice weekly  Assessment & Plan: Visit Diagnoses: No diagnosis found.  Plan: We will begin Profore wraps with silver cell over wound 2 times a week by home health.  To be sure that the wraps do not slide down.  Patient should follow-up with [place] in 4 weeks or sooner if any concerns  Follow-Up Instructions: No follow-ups on file.   Ortho Exam  Patient is alert, oriented, no adenopathy, well-dressed, normal affect, normal respiratory effort. Examination left lower extremity demonstrates significant decrease in swelling.  No cellulitis no erythema no foul odor skin abrasion at proximal tibia has healed.  Wound on the ankle does not probe to bone has good granulation tissue over 85% of the wound no cellulitis  Imaging: No results found. No images are attached to the encounter.  Labs: Lab Results  Component Value Date   HGBA1C 5.7 (H) 09/16/2018   HGBA1C 7.7 (H) 05/15/2018   HGBA1C 7.1 (H) 01/07/2018   ESRSEDRATE 35 (H) 09/03/2016   ESRSEDRATE 111 (H) 05/11/2016   ESRSEDRATE 98 (H) 05/04/2016   CRP 1.2 (H) 09/03/2016   CRP 4.7 (H) 05/11/2016   CRP 8.0 (H) 05/04/2016   REPTSTATUS 11/15/2017 FINAL 11/10/2017   GRAMSTAIN  11/04/2017    ABUNDANT WBC PRESENT, PREDOMINANTLY PMN MODERATE GRAM NEGATIVE RODS MODERATE GRAM POSITIVE COCCI Performed at [facility], [place] [address], [place], [place] [address]    CULT  11/10/2017    NO GROWTH 5 DAYS Performed at [facility] [address] [place], [place] [address]     LABORGA STAPHYLOCOCCUS AUREUS 09/26/2014     Lab Results  Component Value Date   ALBUMIN 3.7 (L) 05/30/2020   ALBUMIN 2.9 (L) 06/19/2019   ALBUMIN 3.5 05/15/2018   PREALBUMIN 19.0 05/04/2016   PREALBUMIN 25.0 12/16/2015    Lab Results  Component Value Date   MG 1.9 05/24/2018   MG 2.1 05/23/2018   MG 1.3 (L) 05/22/2018   No results found for: VD25OH  Lab Results  Component Value Date   PREALBUMIN 19.0 05/04/2016   PREALBUMIN 25.0 12/16/2015   CBC EXTENDED Latest Ref Rng & Units 10/17/2020 10/03/2020 09/19/2020  WBC 3.4 - 10.8 x10E3/uL - - -  RBC 4.14 - 5.80 x10E6/uL - - -  HGB 13.0 - 17.0 g/dL 9.8(L) 9.5(L) 11.0(L)  HCT 39.0 - 52.0 % - - -  PLT 150 - 450 x10E3/uL - - -  NEUTROABS 1.7 - 7.7 K/uL - - -  LYMPHSABS 0.7 - 4.0 K/uL - - -     There is no height or weight on file to calculate BMI.  Orders:  No orders of the defined types were placed in this encounter.  No orders of the defined types were placed in this encounter.    Procedures: No procedures performed  Clinical Data: No  additional findings.  ROS:  All other systems negative, except as noted in the HPI. Review of Systems  Objective: Vital Signs: There were no vitals taken for this visit.  Specialty Comments:  No specialty comments available.  PMFS History: Patient Active Problem List   Diagnosis Date Noted  . Carotid stenosis 06/15/2020  . Dyslipidemia 06/15/2020  . Severe nonproliferative diabetic retinopathy of right eye, with macular edema, associated with type 2 diabetes mellitus ([place]) 01/18/2020  . Severe nonproliferative diabetic retinopathy of left eye, with macular edema, associated with type 2 diabetes mellitus ([place]) 01/04/2020  . Retinal hemorrhage of right eye 01/04/2020  . 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 Pseudoclaudication 11/15/2018  . Essential hypertension 09/18/2018  . S/P BKA (below knee amputation), right ([place]) 09/18/2018  . Amputation of toe of left foot ([place])  09/18/2018  . Peripheral artery disease ([place]) 09/18/2018  . S/P carotid endarterectomy 09/18/2018  . OSA on CPAP 09/18/2018  . Chronic back pain 09/18/2018  . Status post reversal of ileostomy 05/21/2018  . Normocytic anemia 02/14/2018  . Intra-abdominal abscess ([place]) 11/04/2017  . Chronic venous hypertension (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 ([place]) 12/11/2016  . Unilateral primary osteoarthritis, left knee 10/11/2016  . History of right below knee amputation ([place]) 07/12/2016  . Diabetic foot infection ([place]) 05/04/2016  . Type 2 diabetes mellitus with insulin therapy ([place]) 12/16/2015  . Amputated toe ([place]) 10/21/2015  . Leg edema, left 09/03/2015  . CAD (dz of distal, mid and proximal RCA with implantation of 3 overlapping drug-eluting stent,) 09/03/2015  . Chronic diastolic heart failure, NYHA class 2 ([place]) 09/03/2015  . Angina pectoris associated with type 2 diabetes mellitus ([place]) 10/28/2014  . Hyperlipidemia 08/25/2014  . Limb pain 03/20/2013  . DJD (degenerative joint disease) 09/25/2012  . Migraine 09/25/2012  . Neuropathy 09/25/2012  . Restless legs syndrome (RLS) 09/25/2012  . Chronic obstructive pulmonary disease, unspecified ([place]) 04/25/2012  . Unknown cause of morbidity or mortality 04/25/2012  . Chronic total occlusion of artery of the extremities ([place]) 04/08/2012  . Onychomycosis 02/01/2012  . Occlusion and stenosis of carotid artery without mention of cerebral infarction 06/12/2011  . GERD (gastroesophageal reflux disease) 05/08/2011  . Barrett's esophagus without dysplasia 05/08/2011  . Former tobacco use 02/01/2011   Past Medical History:  Diagnosis Date  . Carotid artery occlusion 11/10/10   LEFT CAROTID ENDARTERECTOMY  . Chronic kidney disease   . Complication of anesthesia    BP WENT UP AT [facility] "  . COPD (chronic obstructive pulmonary disease) ([place])    pt denies this dx as of 06/01/20 - no inhaler   . Diabetes mellitus without  complication ([place])   . Diverticulitis   . Diverticulosis of colon (without mention of hemorrhage)   . DJD (degenerative joint disease)    knees/hands/feet/back/neck  . Fatty liver   . Full dentures   . GERD (gastroesophageal reflux disease)   . H/O hiatal hernia   . History of blood transfusion    with a past surical procedure per patient 06/01/20  . Hyperlipidemia   . Hypertension   . Neuromuscular disorder ([place])    peripheral neuropathy  . Non-pressure chronic ulcer of other part of left foot limited to breakdown of skin ([place]) 11/12/2016  . Osteomyelitis ([place])    left 5th metatarsal  . PAD (peripheral artery disease) ([place])    Distal aortogram June 2012. Atherectomy left popliteal artery July 2012.   . Pseudoclaudication 11/15/2018  . Sleep apnea  pt denies this dx as of 06/01/20  . Slurred speech    AS PER WIFE IN D/C NOTE 11/10/10  . 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 Unstable angina ([place]) 09/16/2018  . Wears glasses     Family History  Problem Relation Age of Onset  . Heart disease Father        Before age 104-  CAD, BPG  . Diabetes Father        Amputation  . Cancer Father        PROSTATE  . Hyperlipidemia Father   . Hypertension Father   . Heart attack Father        Triple BPG  . Varicose Veins Father   . Colon cancer Brother   . Diabetes Brother   . Heart disease Brother 43       A-Fib. Before age 23  . Hyperlipidemia Brother   . Hypertension Brother   . Cancer Sister        Breast  . Hyperlipidemia Sister   . Hypertension Sister   . Hypertension Son   . Arthritis Other        GRANDMOTHER  . Hypertension Other        OTHER FAMILY MEMBERS    Past Surgical History:  Procedure Laterality Date  . AMPUTATION  11/05/2011   Procedure: AMPUTATION RAY;  Surgeon: Wylene Simmer, MD;  Location: [facility];  Service: Orthopedics;  Laterality: Right;  Amputation of Right 4&5th Toes  . AMPUTATION Left 11/26/2012   Procedure: AMPUTATION RAY;  Surgeon: Wylene Simmer, MD;  Location: [facility];  Service: Orthopedics;  Laterality: Left;  fourth ray amputation  . AMPUTATION Right 08/27/2014   Procedure: Transmetatarsal Amputation;  Surgeon: Newt Minion, MD;  Location: [facility];  Service: Orthopedics;  Laterality: Right;  . AMPUTATION Right 01/14/2015   Procedure: AMPUTATION BELOW KNEE;  Surgeon: Newt Minion, MD;  Location: [facility];  Service: Orthopedics;  Laterality: Right;  . AMPUTATION Left 10/21/2015   Procedure: Left Foot 5th Ray Amputation;  Surgeon: Newt Minion, MD;  Location: [facility];  Service: Orthopedics;  Laterality: Left;  . ANTERIOR FUSION CERVICAL SPINE  02/06/06   C4-5, C5-6, C6-7; SURGEON DR. MAX COHEN  . AV FISTULA PLACEMENT Left 06/02/2020   Procedure: ARTERIOVENOUS (AV) FISTULA CREATION LEFT;  Surgeon: Waynetta Sandy, MD;  Location: [facility];  Service: Vascular;  Laterality: Left;  . BACK SURGERY     x 3  . BASCILIC VEIN TRANSPOSITION Left 07/21/2020   Procedure: LEFT UPPER ARM ATERIOVENOUS SUPERFISTULALIZATION;  Surgeon: Waynetta Sandy, MD;  Location: [facility];  Service: Vascular;  Laterality: Left;  . BELOW KNEE LEG AMPUTATION Right   . CARDIAC CATHETERIZATION  10/31/04   2009  . CAROTID ENDARTERECTOMY  11/10/10  . CAROTID ENDARTERECTOMY Left 11/10/2010   Subtotal occlusion of left internal carotid artery with left hemispheric transient ischemic attacks.  . CAROTID STENT    . CARPAL TUNNEL RELEASE Right 10/21/2013   Procedure: RIGHT CARPAL TUNNEL RELEASE;  Surgeon: Wynonia Sours, MD;  Location: [facility];  Service: Orthopedics;  Laterality: Right;  . CHOLECYSTECTOMY    . COLON SURGERY    . COLONOSCOPY    . COLOSTOMY REVERSAL  05/21/2018   ileostomy reversal  . CYSTOSCOPY WITH STENT PLACEMENT Bilateral 01/13/2018   Procedure: CYSTOSCOPY WITH BILATERAL URETERAL CATHETER PLACEMENT;  Surgeon: Ardis Hughs, MD;  Location: [facility];  Service: Urology;  Laterality: Bilateral;  . ESOPHAGEAL MANOMETRY Bilateral 07/19/2014  Procedure:  ESOPHAGEAL MANOMETRY (EM);  Surgeon: Jerene Bears, MD;  Location: [facility];  Service: Gastroenterology;  Laterality: Bilateral;  . EYE SURGERY Bilateral 2020   cataract  . FEMORAL ARTERY STENT     x6  . FINGER SURGERY    . FOOT SURGERY  04/25/2016    EXCISION BASE 5TH METATARSAL AND PARTIAL CUBOID LEFT FOOT  . HERNIA REPAIR     LEFT INGUINAL AND UMBILICAL REPAIRS  . HERNIA REPAIR    . I & D EXTREMITY Left 04/25/2016   Procedure: EXCISION BASE 5TH METATARSAL AND PARTIAL CUBOID LEFT FOOT;  Surgeon: Newt Minion, MD;  Location: [facility];  Service: Orthopedics;  Laterality: Left;  . ILEOSTOMY  01/13/2018   Procedure: ILEOSTOMY;  Surgeon: Clovis Riley, MD;  Location: [facility];  Service: General;;  . ILEOSTOMY CLOSURE N/A 05/21/2018   Procedure: ILEOSTOMY REVERSAL ERAS PATHWAY;  Surgeon: Clovis Riley, MD;  Location: [facility];  Service: General;  Laterality: N/A;  . IR RADIOLOGIST EVAL & MGMT  11/19/2017  . IR RADIOLOGIST EVAL & MGMT  12/03/2017  . IR RADIOLOGIST EVAL & MGMT  12/18/2017  . JOINT REPLACEMENT Right 2001   Total knee  . LAMINECTOMY     X 3 LUMBAR AND X 2 CERVICAL SPINE OPERATIONS  . LAPAROSCOPIC CHOLECYSTECTOMY W/ CHOLANGIOGRAPHY  11/09/04   SURGEON DR. Luella Cook  . LEFT HEART CATH AND CORONARY ANGIOGRAPHY N/A 09/16/2018   Procedure: LEFT HEART CATH AND CORONARY ANGIOGRAPHY;  Surgeon: Nigel Mormon, MD;  Location: [facility];  Service: Cardiovascular;  Laterality: N/A;  . LEFT HEART CATHETERIZATION WITH CORONARY ANGIOGRAM N/A 10/29/2014   Procedure: LEFT HEART CATHETERIZATION WITH CORONARY ANGIOGRAM;  Surgeon: Laverda Page, MD;  Location: [facility] C[facility] LAB;  Service: Cardiovascular;  Laterality: N/A;  . LIGATION OF COMPETING BRANCHES OF ARTERIOVENOUS FISTULA Left 07/21/2020   Procedure: LIGATION OF COMPETING BRANCHES OF LEFT UPPER ARM ARTERIOVENOUS FISTULA;  Surgeon: Waynetta Sandy, MD;  Location: [facility];  Service: Vascular;  Laterality: Left;  . LOWER  EXTREMITY ANGIOGRAM N/A 03/19/2012   Procedure: LOWER EXTREMITY ANGIOGRAM;  Surgeon: Burnell Blanks, MD;  Location: [facility] LAB;  Service: Cardiovascular;  Laterality: N/A;  . NECK SURGERY    . PARTIAL COLECTOMY N/A 01/13/2018   Procedure: LAPAROSCOPIC ASSISTED   SIGMOID COLECTOMY ILEOSTOMY;  Surgeon: Clovis Riley, MD;  [facility];  Service: General;  Laterality: N/A;  . PENILE PROSTHESIS IMPLANT  08/14/05   INFRAPUBIC INSERTION OF INFLATABLE PENILE PROSTHESIS; SURGEON DR. Amalia Hailey  . PENILE PROSTHESIS IMPLANT    . PERCUTANEOUS CORONARY STENT INTERVENTION (PCI-S) Right 10/29/2014   Procedure: PERCUTANEOUS CORONARY STENT INTERVENTION (PCI-S);  Surgeon: Laverda Page, MD;  Location: [facility] CATH LAB;  Service: Cardiovascular;  Laterality: Right;  . SHOULDER ARTHROSCOPY    . SPINE SURGERY    . TOE AMPUTATION Left   . TONSILLECTOMY    . TOTAL KNEE ARTHROPLASTY  07/2002   RIGHT KNEE ; SURGEON  DR. [name] ALSO HAD ARTHROSCOPIC RIGHT KNEE IN  10/2001  . TOTAL KNEE ARTHROPLASTY    . ULNAR NERVE TRANSPOSITION Right 10/21/2013   Procedure: RIGHT ELBOW  ULNAR NERVE DECOMPRESSION;  Surgeon: Wynonia Sours, MD;  Location: [facility];  Service: Orthopedics;  Laterality: Right;   Social History   Occupational History  . Occupation: Magazine features editor: UNEMPLOYED  Tobacco Use  . Smoking status: Former Smoker    Packs/day: 2.00  Years: 35.00    Pack years: 70.00    Types: Cigarettes    Quit date: 10/28/2011    Years since quitting: 9.0  . Smokeless tobacco: Never Used  Vaping Use  . Vaping Use: Some days  . Substances: Nicotine  Substance and Sexual Activity  . Alcohol use: Not Currently    Comment: "not in a long time"  . Drug use: Never  . Sexual activity: Yes    Birth control/protection: Implant    Comment: penile implant

## 2020-11-14 NOTE — Telephone Encounter (Signed)
Called [phone] and sw Carrie with [facility] home health. Advised that the pt was seen in the office today and may return to twice a week compression dressing application with silvercell applied to lateral side ankle ulcer. We applied the dressing in office today and the pt can resume twice a week HHN schedule this Thursday. She will call with any questions.

## 2020-11-18 ENCOUNTER — Emergency Department (HOSPITAL_COMMUNITY)
Admission: EM | Admit: 2020-11-18 | Discharge: 2020-11-19 | Disposition: A | Discharge status: Home / Self Care | Payer: 59 | Attending: Emergency Medicine | Admitting: Emergency Medicine

## 2020-11-18 ENCOUNTER — Emergency Department (HOSPITAL_COMMUNITY): Payer: 59

## 2020-11-18 DIAGNOSIS — L97521 Non-pressure chronic ulcer of other part of left foot limited to breakdown of skin: Secondary | ICD-10-CM | POA: Insufficient documentation

## 2020-11-18 DIAGNOSIS — I130 Hypertensive heart and chronic kidney disease with heart failure and stage 1 through stage 4 chronic kidney disease, or unspecified chronic kidney disease: Secondary | ICD-10-CM | POA: Diagnosis not present

## 2020-11-18 DIAGNOSIS — Z87891 Personal history of nicotine dependence: Secondary | ICD-10-CM | POA: Insufficient documentation

## 2020-11-18 DIAGNOSIS — Z955 Presence of coronary angioplasty implant and graft: Secondary | ICD-10-CM | POA: Insufficient documentation

## 2020-11-18 DIAGNOSIS — Z96651 Presence of right artificial knee joint: Secondary | ICD-10-CM | POA: Insufficient documentation

## 2020-11-18 DIAGNOSIS — I5032 Chronic diastolic (congestive) heart failure: Secondary | ICD-10-CM | POA: Diagnosis not present

## 2020-11-18 DIAGNOSIS — R079 Chest pain, unspecified: Secondary | ICD-10-CM | POA: Diagnosis present

## 2020-11-18 DIAGNOSIS — E11621 Type 2 diabetes mellitus with foot ulcer: Secondary | ICD-10-CM | POA: Diagnosis not present

## 2020-11-18 DIAGNOSIS — I2510 Atherosclerotic heart disease of native coronary artery without angina pectoris: Secondary | ICD-10-CM | POA: Insufficient documentation

## 2020-11-18 DIAGNOSIS — E113413 Type 2 diabetes mellitus with severe nonproliferative diabetic retinopathy with macular edema, bilateral: Secondary | ICD-10-CM | POA: Diagnosis not present

## 2020-11-18 DIAGNOSIS — N189 Chronic kidney disease, unspecified: Secondary | ICD-10-CM | POA: Diagnosis not present

## 2020-11-18 DIAGNOSIS — J449 Chronic obstructive pulmonary disease, unspecified: Secondary | ICD-10-CM | POA: Insufficient documentation

## 2020-11-18 DIAGNOSIS — D649 Anemia, unspecified: Secondary | ICD-10-CM | POA: Diagnosis not present

## 2020-11-18 DIAGNOSIS — Z7984 Long term (current) use of oral hypoglycemic drugs: Secondary | ICD-10-CM | POA: Insufficient documentation

## 2020-11-18 DIAGNOSIS — E11618 Type 2 diabetes mellitus with other diabetic arthropathy: Secondary | ICD-10-CM | POA: Diagnosis not present

## 2020-11-18 DIAGNOSIS — Z794 Long term (current) use of insulin: Secondary | ICD-10-CM | POA: Insufficient documentation

## 2020-11-18 DIAGNOSIS — Z7982 Long term (current) use of aspirin: Secondary | ICD-10-CM | POA: Insufficient documentation

## 2020-11-18 DIAGNOSIS — Z79899 Other long term (current) drug therapy: Secondary | ICD-10-CM | POA: Diagnosis not present

## 2020-11-18 LAB — CBC
HCT: 28.7 % — ABNORMAL LOW (ref 39.0–52.0)
Hemoglobin: 9.1 g/dL — ABNORMAL LOW (ref 13.0–17.0)
MCH: 28.3 pg (ref 26.0–34.0)
MCHC: 31.7 g/dL (ref 30.0–36.0)
MCV: 89.4 fL (ref 80.0–100.0)
Platelets: 149 10*3/uL — ABNORMAL LOW (ref 150–400)
RBC: 3.21 MIL/uL — ABNORMAL LOW (ref 4.22–5.81)
RDW: 14.1 % (ref 11.5–15.5)
WBC: 7.7 10*3/uL (ref 4.0–10.5)
nRBC: 0 % (ref 0.0–0.2)

## 2020-11-18 LAB — BASIC METABOLIC PANEL
Anion gap: 7 (ref 5–15)
BUN: 45 mg/dL — ABNORMAL HIGH (ref 8–23)
CO2: 23 mmol/L (ref 22–32)
Calcium: 8.2 mg/dL — ABNORMAL LOW (ref 8.9–10.3)
Chloride: 108 mmol/L (ref 98–111)
Creatinine, Ser: 4.71 mg/dL — ABNORMAL HIGH (ref 0.61–1.24)
GFR, Estimated: 13 mL/min — ABNORMAL LOW (ref >60)
Glucose, Bld: 80 mg/dL (ref 70–99)
Potassium: 4.2 mmol/L (ref 3.5–5.1)
Sodium: 138 mmol/L (ref 135–145)

## 2020-11-18 LAB — TROPONIN I (HIGH SENSITIVITY)
Troponin I (High Sensitivity): 12 ng/L (ref <18)
Troponin I (High Sensitivity): 12 ng/L (ref <18)

## 2020-11-18 NOTE — ED Triage Notes (Signed)
Patient BIB [facility] from home for chest pain. Received 1x SL NTG with no change in, received 324mg  aspirin. Patient alert, oriented, and in no apparent distress at this time.  BP 186/86

## 2020-11-19 NOTE — ED Notes (Signed)
Pt d/c by MD and is provided w/ d/c instructions and follow up care  

## 2020-11-19 NOTE — ED Provider Notes (Incomplete)
[facility] EMERGENCY DEPARTMENT Provider Note   CSN: [id number] Arrival date & time: 11/18/20  1822   History Chief Complaint  Patient presents with  . Chest Pain    [name] is a 65 y.o. male.  The history is provided by the patient.  Chest Pain   Past Medical History:  Diagnosis Date  . Carotid artery occlusion 11/10/10   LEFT CAROTID ENDARTERECTOMY  . Chronic kidney disease   . Complication of anesthesia    BP WENT UP AT [facility] "  . COPD (chronic obstructive pulmonary disease) ([place])    pt denies this dx as of 06/01/20 - no inhaler   . Diabetes mellitus without complication ([place])   . Diverticulitis   . Diverticulosis of colon (without mention of hemorrhage)   . DJD (degenerative joint disease)    knees/hands/feet/back/neck  . Fatty liver   . Full dentures   . GERD (gastroesophageal reflux disease)   . H/O hiatal hernia   . History of blood transfusion    with a past surical procedure per patient 06/01/20  . Hyperlipidemia   . Hypertension   . Neuromuscular disorder ([place])    peripheral neuropathy  . Non-pressure chronic ulcer of other part of left foot limited to breakdown of skin ([place]) 11/12/2016  . Osteomyelitis ([place])    left 5th metatarsal  . PAD (peripheral artery disease) ([place])    Distal aortogram June 2012. Atherectomy left popliteal artery July 2012.   . Pseudoclaudication 11/15/2018  . Sleep apnea    pt denies this dx as of 06/01/20  . Slurred speech    AS PER WIFE IN D/C NOTE 11/10/10  . 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 Unstable angina ([place]) 09/16/2018  . Wears glasses     Patient Active Problem List   Diagnosis Date Noted  . Carotid stenosis 06/15/2020  . Dyslipidemia 06/15/2020  . Severe nonproliferative diabetic retinopathy of right eye, with macular edema, associated with type 2 diabetes mellitus ([place]) 01/18/2020  . Severe nonproliferative diabetic retinopathy of left eye, with macular edema, associated with type 2 diabetes mellitus  ([place]) 01/04/2020  . Retinal hemorrhage of right eye 01/04/2020  . 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 Pseudoclaudication 11/15/2018  . Essential hypertension 09/18/2018  . S/P BKA (below knee amputation), right ([place]) 09/18/2018  . Amputation of toe of left foot ([place]) 09/18/2018  . Peripheral artery disease ([place]) 09/18/2018  . S/P carotid endarterectomy 09/18/2018  . OSA on CPAP 09/18/2018  . Chronic back pain 09/18/2018  . Status post reversal of ileostomy 05/21/2018  . Normocytic anemia 02/14/2018  . Intra-abdominal abscess ([place]) 11/04/2017  . Chronic venous hypertension (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 ([place]) 12/11/2016  . Unilateral primary osteoarthritis, left knee 10/11/2016  . History of right below knee amputation ([place]) 07/12/2016  . Diabetic foot infection ([place]) 05/04/2016  . Type 2 diabetes mellitus with insulin therapy ([place]) 12/16/2015  . Amputated toe ([place]) 10/21/2015  . Leg edema, left 09/03/2015  . CAD (dz of distal, mid and proximal RCA with implantation of 3 overlapping drug-eluting stent,) 09/03/2015  . Chronic diastolic heart failure, NYHA class 2 ([place]) 09/03/2015  . Angina pectoris associated with type 2 diabetes mellitus ([place]) 10/28/2014  . Hyperlipidemia 08/25/2014  . Limb pain 03/20/2013  . DJD (degenerative joint disease) 09/25/2012  . Migraine 09/25/2012  . Neuropathy 09/25/2012  . Restless legs syndrome (RLS) 09/25/2012  . Chronic obstructive pulmonary disease, unspecified ([place]) 04/25/2012  . Unknown cause of  morbidity or mortality 04/25/2012  . Chronic total occlusion of artery of the extremities ([place]) 04/08/2012  . Onychomycosis 02/01/2012  . Occlusion and stenosis of carotid artery without mention of cerebral infarction 06/12/2011  . GERD (gastroesophageal reflux disease) 05/08/2011  . Barrett's esophagus without dysplasia 05/08/2011  . Former tobacco use 02/01/2011    Past Surgical History:  Procedure Laterality Date   . AMPUTATION  11/05/2011   Procedure: AMPUTATION RAY;  Surgeon: Wylene Simmer, MD;  Location: [place];  Service: Orthopedics;  Laterality: Right;  Amputation of Right 4&5th Toes  . AMPUTATION Left 11/26/2012   Procedure: AMPUTATION RAY;  Surgeon: Wylene Simmer, MD;  Location: [place];  Service: Orthopedics;  Laterality: Left;  fourth ray amputation  . AMPUTATION Right 08/27/2014   Procedure: Transmetatarsal Amputation;  Surgeon: Newt Minion, MD;  Location: [place];  Service: Orthopedics;  Laterality: Right;  . AMPUTATION Right 01/14/2015   Procedure: AMPUTATION BELOW KNEE;  Surgeon: Newt Minion, MD;  Location: [place];  Service: Orthopedics;  Laterality: Right;  . AMPUTATION Left 10/21/2015   Procedure: Left Foot 5th Ray Amputation;  Surgeon: Newt Minion, MD;  Location: [place];  Service: Orthopedics;  Laterality: Left;  . ANTERIOR FUSION CERVICAL SPINE  02/06/06   C4-5, C5-6, C6-7; SURGEON DR. MAX COHEN  . AV FISTULA PLACEMENT Left 06/02/2020   Procedure: ARTERIOVENOUS (AV) FISTULA CREATION LEFT;  Surgeon: Waynetta Sandy, MD;  Location: [place];  Service: Vascular;  Laterality: Left;  . BACK SURGERY     x 3  . BASCILIC VEIN TRANSPOSITION Left 07/21/2020   Procedure: LEFT UPPER ARM ATERIOVENOUS SUPERFISTULALIZATION;  Surgeon: Waynetta Sandy, MD;  Location: [place];  Service: Vascular;  Laterality: Left;  . BELOW KNEE LEG AMPUTATION Right   . CARDIAC CATHETERIZATION  10/31/04   2009  . CAROTID ENDARTERECTOMY  11/10/10  . CAROTID ENDARTERECTOMY Left 11/10/2010   Subtotal occlusion of left internal carotid artery with left hemispheric transient ischemic attacks.  . CAROTID STENT    . CARPAL TUNNEL RELEASE Right 10/21/2013   Procedure: RIGHT CARPAL TUNNEL RELEASE;  Surgeon: Wynonia Sours, MD;  Location: [place];  Service: Orthopedics;  Laterality: Right;  . CHOLECYSTECTOMY    . COLON SURGERY    . COLONOSCOPY    . COLOSTOMY REVERSAL  05/21/2018   ileostomy reversal  .  CYSTOSCOPY WITH STENT PLACEMENT Bilateral 01/13/2018   Procedure: CYSTOSCOPY WITH BILATERAL URETERAL CATHETER PLACEMENT;  Surgeon: Ardis Hughs, MD;  Location: [facility];  Service: Urology;  Laterality: Bilateral;  . ESOPHAGEAL MANOMETRY Bilateral 07/19/2014   Procedure: ESOPHAGEAL MANOMETRY (EM);  Surgeon: Jerene Bears, MD;  Location: [facility];  Service: Gastroenterology;  Laterality: Bilateral;  . EYE SURGERY Bilateral 2020   cataract  . FEMORAL ARTERY STENT     x6  . FINGER SURGERY    . FOOT SURGERY  04/25/2016    EXCISION BASE 5TH METATARSAL AND PARTIAL CUBOID LEFT FOOT  . HERNIA REPAIR     LEFT INGUINAL AND UMBILICAL REPAIRS  . HERNIA REPAIR    . I & D EXTREMITY Left 04/25/2016   Procedure: EXCISION BASE 5TH METATARSAL AND PARTIAL CUBOID LEFT FOOT;  Surgeon: Newt Minion, MD;  Location: [place];  Service: Orthopedics;  Laterality: Left;  . ILEOSTOMY  01/13/2018   Procedure: ILEOSTOMY;  Surgeon: Clovis Riley, MD;  Location: [facility];  Service: General;;  . ILEOSTOMY CLOSURE N/A 05/21/2018   Procedure: ILEOSTOMY REVERSAL ERAS PATHWAY;  Surgeon: Clovis Riley, MD;  Location: [place];  Service: General;  Laterality: N/A;  . IR RADIOLOGIST EVAL & MGMT  11/19/2017  . IR RADIOLOGIST EVAL & MGMT  12/03/2017  . IR RADIOLOGIST EVAL & MGMT  12/18/2017  . JOINT REPLACEMENT Right 2001   Total knee  . LAMINECTOMY     X 3 LUMBAR AND X 2 CERVICAL SPINE OPERATIONS  . LAPAROSCOPIC CHOLECYSTECTOMY W/ CHOLANGIOGRAPHY  11/09/04   SURGEON DR. Luella Cook  . LEFT HEART CATH AND CORONARY ANGIOGRAPHY N/A 09/16/2018   Procedure: LEFT HEART CATH AND CORONARY ANGIOGRAPHY;  Surgeon: Nigel Mormon, MD;  Location: [place] [facility];  Service: Cardiovascular;  Laterality: N/A;  . LEFT HEART CATHETERIZATION WITH CORONARY ANGIOGRAM N/A 10/29/2014   Procedure: LEFT HEART CATHETERIZATION WITH CORONARY ANGIOGRAM;  Surgeon: Laverda Page, MD;  Location: [facility] C[facility];  Service: Cardiovascular;  Laterality:  N/A;  . LIGATION OF COMPETING BRANCHES OF ARTERIOVENOUS FISTULA Left 07/21/2020   Procedure: LIGATION OF COMPETING BRANCHES OF LEFT UPPER ARM ARTERIOVENOUS FISTULA;  Surgeon: Waynetta Sandy, MD;  Location: [place];  Service: Vascular;  Laterality: Left;  . LOWER EXTREMITY ANGIOGRAM N/A 03/19/2012   Procedure: LOWER EXTREMITY ANGIOGRAM;  Surgeon: Burnell Blanks, MD;  Location: [facility] CATH LAB;  Service: Cardiovascular;  Laterality: N/A;  . NECK SURGERY    . PARTIAL COLECTOMY N/A 01/13/2018   Procedure: LAPAROSCOPIC ASSISTED   SIGMOID COLECTOMY ILEOSTOMY;  Surgeon: Clovis Riley, MD;  Location: [facility];  Service: General;  Laterality: N/A;  . PENILE PROSTHESIS IMPLANT  08/14/05   INFRAPUBIC INSERTION OF INFLATABLE PENILE PROSTHESIS; SURGEON DR. Amalia Hailey  . PENILE PROSTHESIS IMPLANT    . PERCUTANEOUS CORONARY STENT INTERVENTION (PCI-S) Right 10/29/2014   Procedure: PERCUTANEOUS CORONARY STENT INTERVENTION (PCI-S);  Surgeon: Laverda Page, MD;  Location: [facility] CATH LAB;  Service: Cardiovascular;  Laterality: Right;  . SHOULDER ARTHROSCOPY    . SPINE SURGERY    . TOE AMPUTATION Left   . TONSILLECTOMY    . TOTAL KNEE ARTHROPLASTY  07/2002   RIGHT KNEE ; SURGEON  DR. [name] ALSO HAD ARTHROSCOPIC RIGHT KNEE IN  10/2001  . TOTAL KNEE ARTHROPLASTY    . ULNAR NERVE TRANSPOSITION Right 10/21/2013   Procedure: RIGHT ELBOW  ULNAR NERVE DECOMPRESSION;  Surgeon: Wynonia Sours, MD;  Location: [place];  Service: Orthopedics;  Laterality: Right;       Family History  Problem Relation Age of Onset  . Heart disease Father        Before age 54-  CAD, BPG  . Diabetes Father        Amputation  . Cancer Father        PROSTATE  . Hyperlipidemia Father   . Hypertension Father   . Heart attack Father        Triple BPG  . Varicose Veins Father   . Colon cancer Brother   . Diabetes Brother   . Heart disease Brother 2       A-Fib. Before age 92  . Hyperlipidemia Brother   .  Hypertension Brother   . Cancer Sister        Breast  . Hyperlipidemia Sister   . Hypertension Sister   . Hypertension Son   . Arthritis Other        GRANDMOTHER  . Hypertension Other        OTHER FAMILY MEMBERS    Social History   Tobacco Use  .  Smoking status: Former Smoker    Packs/day: 2.00    Years: 35.00    Pack years: 70.00    Types: Cigarettes    Quit date: 10/28/2011    Years since quitting: 9.0  . Smokeless tobacco: Never Used  Vaping Use  . Vaping Use: Some days  . Substances: Nicotine  Substance Use Topics  . Alcohol use: Not Currently    Comment: "not in a long time"  . Drug use: Never    Home Medications Prior to Admission medications   Medication Sig Start Date End Date Taking? Authorizing Provider  amLODipine (NORVASC) 10 MG tablet Take 1 tablet (10 mg total) by mouth daily. 08/04/19   Adrian Prows, MD  amoxicillin (AMOXIL) 250 MG chewable tablet Chew 250 mg by mouth 2 (two) times daily.    [provider]  aspirin EC 81 MG tablet Take 81 mg by mouth daily.    [provider]  atorvastatin (LIPITOR) 40 MG tablet Take 40 mg by mouth at bedtime.     [provider]  BIDIL 20-37.5 MG tablet Take 1 tablet by mouth 3 (three) times daily. 04/28/20   [provider]  carvedilol (COREG) 25 MG tablet Take 1.5 tablets (37.5 mg total) by mouth 2 (two) times daily. 08/01/16   Adrian Prows, MD  clobetasol (TEMOVATE) 0.05 % external solution Apply 1 application topically daily as needed (dry scalp).    [provider]  clopidogrel (PLAVIX) 75 MG tablet Take 75 mg by mouth daily.  03/28/12   [provider]  doxycycline (VIBRA-TABS) 100 MG tablet Take 1 tablet (100 mg total) by mouth 2 (two) times daily. 08/12/20   Persons, Bevely Palmer, PA  doxycycline (VIBRA-TABS) 100 MG tablet Take 1 tablet (100 mg total) by mouth 2 (two) times daily. 09/30/20   Persons, Bevely Palmer, PA  fentaNYL (DURAGESIC - DOSED MCG/HR) 50 MCG/HR Place 50 mcg  onto the skin every 3 (three) days.    [provider]  ferrous gluconate (FERGON) 324 MG tablet Take 324 mg by mouth 2 (two) times daily.    [provider]  fluticasone (CUTIVATE) 0.05 % cream Apply 1 application topically 2 (two) times daily as needed (dry skin on face).     [provider]  folic acid (FOLVITE) 1 MG tablet Take 1 mg by mouth daily.    [provider]  Glucosamine HCl (GLUCOSAMINE PO) Take 1 tablet by mouth 2 (two) times daily.     [provider]  HUMALOG KWIKPEN 100 UNIT/ML KiwkPen Inject 15-16 Units into the skin See admin instructions. Inject 13 units before breakfast, lunch, & dinner-- 11/14/17   [provider]  insulin glargine (LANTUS) 100 UNIT/ML injection Inject 55 Units into the skin at bedtime.    [provider]  ketoconazole (NIZORAL) 2 % cream Apply 1 application topically 2 (two) times daily as needed for irritation.  03/07/18   [provider]  linaclotide (LINZESS) 145 MCG CAPS capsule Take 145 mcg by mouth daily as needed (constipation).     [provider]  loratadine (CLARITIN) 10 MG tablet Take 10 mg by mouth daily.    [provider]  Multiple Vitamin (MULTIVITAMIN) tablet Take 1 tablet by mouth daily.    [provider]  NEEDLE, REUSABLE, 22 G 22G X 1-1/2" MISC 1 Units by Does not apply route as directed. For B12 IM inj 02/15/18   Elgergawy, Silver Huguenin, MD  omega-3 acid ethyl esters (LOVAZA) 1  g capsule Take 2 g by mouth 2 (two) times daily.    [provider]  ondansetron (ZOFRAN) 4 MG tablet Take 4 mg by mouth every 6 (six) hours as needed for nausea or vomiting.    [provider]  pantoprazole (PROTONIX) 40 MG tablet Take 40 mg by mouth daily.    [provider]  Polyvinyl Alcohol-Povidone PF 1.4-0.6 % SOLN Place 1 drop into both eyes 2 (two) times daily as needed (dry eyes).     [provider]  pregabalin (LYRICA) 100 MG  capsule Take 1 capsule (100 mg total) by mouth 3 (three) times daily. 05/10/16   Orson Eva, MD  rOPINIRole (REQUIP) 2 MG tablet Take 2 mg by mouth at bedtime.     [provider]  Syringe/Needle, Disp, (SYRINGE 3CC/22GX1-1/2") 22G X 1-1/2" 3 ML MISC 1 Syringe by Does not apply route as directed. For b12 IM inj 02/15/18   Elgergawy, Silver Huguenin, MD  tamsulosin (FLOMAX) 0.4 MG CAPS capsule Take 0.4 mg by mouth daily.    [provider]  torsemide (DEMADEX) 20 MG tablet Take 20 mg by mouth See admin instructions. Take 20 mg daily, may take a second 20 mg dose before 1500 as needed for swelling    [provider]    Allergies    Contrast media [iodinated diagnostic agents], Ivp dye [iodinated diagnostic agents], and Adhesive [tape]  Review of Systems   Review of Systems  Cardiovascular: Positive for chest pain.  All other systems reviewed and are negative.   Physical Exam Updated Vital Signs BP (!) 167/83   Pulse 86   Temp 98.6 F (37 C)   Resp 17   SpO2 98%   Physical Exam Vitals and nursing note reviewed.   *** year old ***male, resting comfortably and in no acute distress. Vital signs are ***. Oxygen saturation is ***%, which is normal. Head is normocephalic and atraumatic. PERRLA, EOMI. Oropharynx is clear. Neck is nontender and supple without adenopathy or JVD. Back is nontender and there is no CVA tenderness. Lungs are clear without rales, wheezes, or rhonchi. Chest is nontender. Heart has regular rate and rhythm without murmur. Abdomen is soft, flat, nontender without masses or hepatosplenomegaly and peristalsis is normoactive. Extremities have no cyanosis or edema, full range of motion is present. Skin is warm and dry without rash. Neurologic: Mental status is normal, cranial nerves are intact, there are no motor or sensory deficits.  ED Results / Procedures / Treatments   Labs (all labs ordered are listed, but only abnormal results are  displayed) Labs Reviewed  BASIC METABOLIC PANEL - Abnormal; Notable for the following components:      Result Value   BUN 45 (*)    Creatinine, Ser 4.71 (*)    Calcium 8.2 (*)    GFR, Estimated 13 (*)    All other components within normal limits  CBC - Abnormal; Notable for the following components:   RBC 3.21 (*)    Hemoglobin 9.1 (*)    HCT 28.7 (*)    Platelets 149 (*)    All other components within normal limits  TROPONIN I (HIGH SENSITIVITY)  TROPONIN I (HIGH SENSITIVITY)    EKG EKG Interpretation  Date/Time:  Saturday November 19 2020 00:54:17 EST Ventricular Rate:  55 PR Interval:  234 QRS Duration: 158 QT Interval:  569 QTC Calculation: 545 R Axis:   -62 Text Interpretation: Sinus rhythm Prolonged PR interval RBBB and LAFB Left ventricular hypertrophy  When compared with ECG of 11/18/2020, Premature ventricular complexes are no longer present Confirmed by Delora Fuel ([id number]) on 11/19/2020 12:57:34 AM   Radiology DG Chest 2 View  Result Date: 11/18/2020 CLINICAL DATA:  Chest pain. EXAM: CHEST - 2 VIEW COMPARISON:  Chest x-ray 09/16/2018 FINDINGS: The heart is mildly enlarged. There is mild tortuosity and calcification of the thoracic aorta. Central vascular congestion and interstitial pulmonary edema with suspected small effusions consistent with CHF. No infiltrates or pneumothorax. IMPRESSION: CHF. Electronically Signed   By: Marijo Sanes M.D.   On: 11/18/2020 19:29    Procedures Procedures {Remember to document critical care time when appropriate:1}  Medications Ordered in ED Medications - No data to display  ED Course  I have reviewed the triage vital signs and the nursing notes.  Pertinent labs & imaging results that were available during my care of the patient were reviewed by me and considered in my medical decision making (see chart for details).    MDM Rules/Calculators/A&P                          *** Final Clinical Impression(s) / ED Diagnoses Final  diagnoses:  None    Rx / DC Orders ED Discharge Orders    None

## 2020-11-19 NOTE — Discharge Instructions (Addendum)
Return if symptoms are getting worse. °

## 2020-11-19 NOTE — ED Notes (Signed)
Pt not very cooperative when NT asked pt to put the gown on.

## 2020-11-19 NOTE — ED Provider Notes (Signed)
[facility] EMERGENCY DEPARTMENT Provider Note   CSN: [id number] Arrival date & time: 11/18/20  1822    History Chief Complaint  Patient presents with  . Chest Pain    [name] is a 65 y.o. male.  The history is provided by medical records and the patient.  Chest Pain He has history of hypertension, diabetes, hyperlipidemia, peripheral vascular disease, coronary artery disease, COPD and comes in complaining of chest pain.  EMS gave him aspirin and nitroglycerin without relief.  When I went in to see him, patient was sleeping and actually could not be aroused so I was unable to get any additional history.  Past Medical History:  Diagnosis Date  . Carotid artery occlusion 11/10/10   LEFT CAROTID ENDARTERECTOMY  . Chronic kidney disease   . Complication of anesthesia    BP WENT UP AT [facility] "  . COPD (chronic obstructive pulmonary disease) ([place])    pt denies this dx as of 06/01/20 - no inhaler   . Diabetes mellitus without complication ([place])   . Diverticulitis   . Diverticulosis of colon (without mention of hemorrhage)   . DJD (degenerative joint disease)    knees/hands/feet/back/neck  . Fatty liver   . Full dentures   . GERD (gastroesophageal reflux disease)   . H/O hiatal hernia   . History of blood transfusion    with a past surical procedure per patient 06/01/20  . Hyperlipidemia   . Hypertension   . Neuromuscular disorder ([place])    peripheral neuropathy  . Non-pressure chronic ulcer of other part of left foot limited to breakdown of skin ([place]) 11/12/2016  . Osteomyelitis ([place])    left 5th metatarsal  . PAD (peripheral artery disease) ([employer])    Distal aortogram June 2012. Atherectomy left popliteal artery July 2012.   . Pseudoclaudication 11/15/2018  . Sleep apnea    pt denies this dx as of 06/01/20  . Slurred speech    AS PER WIFE IN D/C NOTE 11/10/10  . 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 Unstable angina ([place]) 09/16/2018  . Wears glasses     Patient  Active Problem List   Diagnosis Date Noted  . Carotid stenosis 06/15/2020  . Dyslipidemia 06/15/2020  . Severe nonproliferative diabetic retinopathy of right eye, with macular edema, associated with type 2 diabetes mellitus ([place]) 01/18/2020  . Severe nonproliferative diabetic retinopathy of left eye, with macular edema, associated with type 2 diabetes mellitus ([place]) 01/04/2020  . Retinal hemorrhage of right eye 01/04/2020  . 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 Pseudoclaudication 11/15/2018  . Essential hypertension 09/18/2018  . S/P BKA (below knee amputation), right ([place]) 09/18/2018  . Amputation of toe of left foot ([place]) 09/18/2018  . Peripheral artery disease ([place]) 09/18/2018  . S/P carotid endarterectomy 09/18/2018  . OSA on CPAP 09/18/2018  . Chronic back pain 09/18/2018  . Status post reversal of ileostomy 05/21/2018  . Normocytic anemia 02/14/2018  . Intra-abdominal abscess ([place]) 11/04/2017  . Chronic venous hypertension (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 ([place]) 12/11/2016  . Unilateral primary osteoarthritis, left knee 10/11/2016  . History of right below knee amputation ([place]) 07/12/2016  . Diabetic foot infection ([place]) 05/04/2016  . Type 2 diabetes mellitus with insulin therapy ([place]) 12/16/2015  . Amputated toe ([place]) 10/21/2015  . Leg edema, left 09/03/2015  . CAD (dz of distal, mid and proximal RCA with implantation of 3 overlapping drug-eluting stent,) 09/03/2015  . Chronic diastolic heart failure, NYHA class 2 (  [place]) 09/03/2015  . Angina pectoris associated with type 2 diabetes mellitus ([place]) 10/28/2014  . Hyperlipidemia 08/25/2014  . Limb pain 03/20/2013  . DJD (degenerative joint disease) 09/25/2012  . Migraine 09/25/2012  . Neuropathy 09/25/2012  . Restless legs syndrome (RLS) 09/25/2012  . Chronic obstructive pulmonary disease, unspecified ([place]) 04/25/2012  . Unknown cause of morbidity or mortality 04/25/2012  . Chronic total occlusion of  artery of the extremities ([place]) 04/08/2012  . Onychomycosis 02/01/2012  . Occlusion and stenosis of carotid artery without mention of cerebral infarction 06/12/2011  . GERD (gastroesophageal reflux disease) 05/08/2011  . Barrett's esophagus without dysplasia 05/08/2011  . Former tobacco use 02/01/2011    Past Surgical History:  Procedure Laterality Date  . AMPUTATION  11/05/2011   Procedure: AMPUTATION RAY;  Surgeon: Wylene Simmer, MD;  Location: [place];  Service: Orthopedics;  Laterality: Right;  Amputation of Right 4&5th Toes  . AMPUTATION Left 11/26/2012   Procedure: AMPUTATION RAY;  Surgeon: Wylene Simmer, MD;  Location: [place];  Service: Orthopedics;  Laterality: Left;  fourth ray amputation  . AMPUTATION Right 08/27/2014   Procedure: Transmetatarsal Amputation;  Surgeon: Newt Minion, MD;  Location: [place];  Service: Orthopedics;  Laterality: Right;  . AMPUTATION Right 01/14/2015   Procedure: AMPUTATION BELOW KNEE;  Surgeon: Newt Minion, MD;  Location: [place];  Service: Orthopedics;  Laterality: Right;  . AMPUTATION Left 10/21/2015   Procedure: Left Foot 5th Ray Amputation;  Surgeon: Newt Minion, MD;  Location: [place];  Service: Orthopedics;  Laterality: Left;  . ANTERIOR FUSION CERVICAL SPINE  02/06/06   C4-5, C5-6, C6-7; SURGEON DR. MAX COHEN  . AV FISTULA PLACEMENT Left 06/02/2020   Procedure: ARTERIOVENOUS (AV) FISTULA CREATION LEFT;  Surgeon: Waynetta Sandy, MD;  Location: [place];  Service: Vascular;  Laterality: Left;  . BACK SURGERY     x 3  . BASCILIC VEIN TRANSPOSITION Left 07/21/2020   Procedure: LEFT UPPER ARM ATERIOVENOUS SUPERFISTULALIZATION;  Surgeon: Waynetta Sandy, MD;  Location: [place];  Service: Vascular;  Laterality: Left;  . BELOW KNEE LEG AMPUTATION Right   . CARDIAC CATHETERIZATION  10/31/04   2009  . CAROTID ENDARTERECTOMY  11/10/10  . CAROTID ENDARTERECTOMY Left 11/10/2010   Subtotal occlusion of left internal carotid artery with left hemispheric  transient ischemic attacks.  . CAROTID STENT    . CARPAL TUNNEL RELEASE Right 10/21/2013   Procedure: RIGHT CARPAL TUNNEL RELEASE;  Surgeon: Wynonia Sours, MD;  Location: [place];  Service: Orthopedics;  Laterality: Right;  . CHOLECYSTECTOMY    . COLON SURGERY    . COLONOSCOPY    . COLOSTOMY REVERSAL  05/21/2018   ileostomy reversal  . CYSTOSCOPY WITH STENT PLACEMENT Bilateral 01/13/2018   Procedure: CYSTOSCOPY WITH BILATERAL URETERAL CATHETER PLACEMENT;  Surgeon: Ardis Hughs, MD;  Location: [facility];  Service: Urology;  Laterality: Bilateral;  . ESOPHAGEAL MANOMETRY Bilateral 07/19/2014   Procedure: ESOPHAGEAL MANOMETRY (EM);  Surgeon: Jerene Bears, MD;  Location: [facility];  Service: Gastroenterology;  Laterality: Bilateral;  . EYE SURGERY Bilateral 2020   cataract  . FEMORAL ARTERY STENT     x6  . FINGER SURGERY    . FOOT SURGERY  04/25/2016    EXCISION BASE 5TH METATARSAL AND PARTIAL CUBOID LEFT FOOT  . HERNIA REPAIR     LEFT INGUINAL AND UMBILICAL REPAIRS  . HERNIA REPAIR    . I & D EXTREMITY Left 04/25/2016   Procedure: EXCISION BASE  5TH METATARSAL AND PARTIAL CUBOID LEFT FOOT;  Surgeon: Newt Minion, MD;  Location: [place];  Service: Orthopedics;  Laterality: Left;  . ILEOSTOMY  01/13/2018   Procedure: ILEOSTOMY;  Surgeon: Clovis Riley, MD;  Location: [facility];  Service: General;;  . ILEOSTOMY CLOSURE N/A 05/21/2018   Procedure: ILEOSTOMY REVERSAL ERAS PATHWAY;  Surgeon: Clovis Riley, MD;  Location: [place];  Service: General;  Laterality: N/A;  . IR RADIOLOGIST EVAL & MGMT  11/19/2017  . IR RADIOLOGIST EVAL & MGMT  12/03/2017  . IR RADIOLOGIST EVAL & MGMT  12/18/2017  . JOINT REPLACEMENT Right 2001   Total knee  . LAMINECTOMY     X 3 LUMBAR AND X 2 CERVICAL SPINE OPERATIONS  . LAPAROSCOPIC CHOLECYSTECTOMY W/ CHOLANGIOGRAPHY  11/09/04   SURGEON DR. Luella Cook  . LEFT HEART CATH AND CORONARY ANGIOGRAPHY N/A 09/16/2018   Procedure: LEFT HEART CATH AND  CORONARY ANGIOGRAPHY;  Surgeon: Nigel Mormon, MD;  Location: [place] [facility];  Service: Cardiovascular;  Laterality: N/A;  . LEFT HEART CATHETERIZATION WITH CORONARY ANGIOGRAM N/A 10/29/2014   Procedure: LEFT HEART CATHETERIZATION WITH CORONARY ANGIOGRAM;  Surgeon: Laverda Page, MD;  Location: [facility] LAB;  Service: Cardiovascular;  Laterality: N/A;  . LIGATION OF COMPETING BRANCHES OF ARTERIOVENOUS FISTULA Left 07/21/2020   Procedure: LIGATION OF COMPETING BRANCHES OF LEFT UPPER ARM ARTERIOVENOUS FISTULA;  Surgeon: Waynetta Sandy, MD;  Location: [place];  Service: Vascular;  Laterality: Left;  . LOWER EXTREMITY ANGIOGRAM N/A 03/19/2012   Procedure: LOWER EXTREMITY ANGIOGRAM;  Surgeon: Burnell Blanks, MD;  Location: [facility] CATH LAB;  Service: Cardiovascular;  Laterality: N/A;  . NECK SURGERY    . PARTIAL COLECTOMY N/A 01/13/2018   Procedure: LAPAROSCOPIC ASSISTED   SIGMOID COLECTOMY ILEOSTOMY;  Surgeon: Clovis Riley, MD;  Location[facility];  Service: General;  Laterality: N/A;  . PENILE PROSTHESIS IMPLANT  08/14/05   INFRAPUBIC INSERTION OF INFLATABLE PENILE PROSTHESIS; SURGEON DR. Amalia Hailey  . PENILE PROSTHESIS IMPLANT    . PERCUTANEOUS CORONARY STENT INTERVENTION (PCI-S) Right 10/29/2014   Procedure: PERCUTANEOUS CORONARY STENT INTERVENTION (PCI-S);  Surgeon: Laverda Page, MD;  Location: [facility] CATH LAB;  Service: Cardiovascular;  Laterality: Right;  . SHOULDER ARTHROSCOPY    . SPINE SURGERY    . TOE AMPUTATION Left   . TONSILLECTOMY    . TOTAL KNEE ARTHROPLASTY  07/2002   RIGHT KNEE ; SURGEON  DR. [name] ALSO HAD ARTHROSCOPIC RIGHT KNEE IN  10/2001  . TOTAL KNEE ARTHROPLASTY    . ULNAR NERVE TRANSPOSITION Right 10/21/2013   Procedure: RIGHT ELBOW  ULNAR NERVE DECOMPRESSION;  Surgeon: Wynonia Sours, MD;  Location: [place];  Service: Orthopedics;  Laterality: Right;       Family History  Problem Relation Age of Onset  . Heart disease Father         Before age 35-  CAD, BPG  . Diabetes Father        Amputation  . Cancer Father        PROSTATE  . Hyperlipidemia Father   . Hypertension Father   . Heart attack Father        Triple BPG  . Varicose Veins Father   . Colon cancer Brother   . Diabetes Brother   . Heart disease Brother 82       A-Fib. Before age 7  . Hyperlipidemia Brother   . Hypertension Brother   . Cancer Sister  Breast  . Hyperlipidemia Sister   . Hypertension Sister   . Hypertension Son   . Arthritis Other        GRANDMOTHER  . Hypertension Other        OTHER FAMILY MEMBERS    Social History   Tobacco Use  . Smoking status: Former Smoker    Packs/day: 2.00    Years: 35.00    Pack years: 70.00    Types: Cigarettes    Quit date: 10/28/2011    Years since quitting: 9.0  . Smokeless tobacco: Never Used  Vaping Use  . Vaping Use: Some days  . Substances: Nicotine  Substance Use Topics  . Alcohol use: Not Currently    Comment: "not in a long time"  . Drug use: Never    Home Medications Prior to Admission medications   Medication Sig Start Date End Date Taking? Authorizing Provider  amLODipine (NORVASC) 10 MG tablet Take 1 tablet (10 mg total) by mouth daily. 08/04/19   Adrian Prows, MD  aspirin EC 81 MG tablet Take 81 mg by mouth daily.    [provider]  atorvastatin (LIPITOR) 40 MG tablet Take 40 mg by mouth at bedtime.     [provider]  BIDIL 20-37.5 MG tablet Take 1 tablet by mouth 3 (three) times daily. 04/28/20   [provider]  carvedilol (COREG) 25 MG tablet Take 1.5 tablets (37.5 mg total) by mouth 2 (two) times daily. 08/01/16   Adrian Prows, MD  clobetasol (TEMOVATE) 0.05 % external solution Apply 1 application topically daily as needed (dry scalp).    [provider]  clopidogrel (PLAVIX) 75 MG tablet Take 75 mg by mouth daily.  03/28/12   [provider]  fentaNYL (DURAGESIC - DOSED MCG/HR) 50 MCG/HR Place 50 mcg onto the skin every 3  (three) days.    [provider]  ferrous gluconate (FERGON) 324 MG tablet Take 324 mg by mouth 2 (two) times daily.    [provider]  fluticasone (CUTIVATE) 0.05 % cream Apply 1 application topically 2 (two) times daily as needed (dry skin on face).     [provider]  folic acid (FOLVITE) 1 MG tablet Take 1 mg by mouth daily.    [provider]  Glucosamine HCl (GLUCOSAMINE PO) Take 1 tablet by mouth 2 (two) times daily.     [provider]  HUMALOG KWIKPEN 100 UNIT/ML KiwkPen Inject 15-16 Units into the skin See admin instructions. Inject 13 units before breakfast, lunch, & dinner-- 11/14/17   [provider]  insulin glargine (LANTUS) 100 UNIT/ML injection Inject 55 Units into the skin at bedtime.    [provider]  ketoconazole (NIZORAL) 2 % cream Apply 1 application topically 2 (two) times daily as needed for irritation.  03/07/18   [provider]  linaclotide (LINZESS) 145 MCG CAPS capsule Take 145 mcg by mouth daily as needed (constipation).     [provider]  loratadine (CLARITIN) 10 MG tablet Take 10 mg by mouth daily.    [provider]  Multiple Vitamin (MULTIVITAMIN) tablet Take 1 tablet by mouth daily.    [provider]  NEEDLE, REUSABLE, 22 G 22G X 1-1/2" MISC 1 Units by Does not apply route as directed. For B12 IM inj 02/15/18   Elgergawy, Silver Huguenin, MD  omega-3 acid ethyl esters (LOVAZA) 1 g capsule Take 2 g by mouth 2 (two) times daily.    [provider]  ondansetron (  ZOFRAN) 4 MG tablet Take 4 mg by mouth every 6 (six) hours as needed for nausea or vomiting.    [provider]  pantoprazole (PROTONIX) 40 MG tablet Take 40 mg by mouth daily.    [provider]  Polyvinyl Alcohol-Povidone PF 1.4-0.6 % SOLN Place 1 drop into both eyes 2 (two) times daily as needed (dry eyes).     [provider]  pregabalin (LYRICA) 100 MG capsule Take 1 capsule  (100 mg total) by mouth 3 (three) times daily. 05/10/16   Orson Eva, MD  rOPINIRole (REQUIP) 2 MG tablet Take 2 mg by mouth at bedtime.     [provider]  Syringe/Needle, Disp, (SYRINGE 3CC/22GX1-1/2") 22G X 1-1/2" 3 ML MISC 1 Syringe by Does not apply route as directed. For b12 IM inj 02/15/18   Elgergawy, Silver Huguenin, MD  tamsulosin (FLOMAX) 0.4 MG CAPS capsule Take 0.4 mg by mouth daily.    [provider]  torsemide (DEMADEX) 20 MG tablet Take 20 mg by mouth See admin instructions. Take 20 mg daily, may take a second 20 mg dose before 1500 as needed for swelling    [provider]    Allergies    Contrast media [iodinated diagnostic agents], Ivp dye [iodinated diagnostic agents], and Adhesive [tape]  Review of Systems   Review of Systems  Cardiovascular: Positive for chest pain.  All other systems reviewed and are negative.   Physical Exam Updated Vital Signs BP (!) 141/49   Pulse (!) 49   Temp 98.6 F (37 C)   Resp 14   SpO2 97%   Physical Exam Vitals and nursing note reviewed.   65 year old male, resting comfortably and in no acute distress. Vital signs are significant for slow heart rate. Oxygen saturation is 97%, which is normal. Head is normocephalic and atraumatic. PERRLA, EOMI. Oropharynx is clear. Neck is nontender and supple without adenopathy or JVD. Back is nontender and there is no CVA tenderness. Lungs are clear without rales, wheezes, or rhonchi. Chest is nontender. Heart has regular rate and rhythm without murmur. Abdomen is soft, flat, nontender without masses or hepatosplenomegaly and peristalsis is normoactive. Extremities: Right below the knee amputation.  2-3+ pretibial and pedal edema on the left. Skin is warm and dry without rash. Neurologic: Sleeping, but no gross deficits  ED Results / Procedures / Treatments   Labs (all labs ordered are listed, but only abnormal results are displayed) Labs Reviewed  BASIC METABOLIC PANEL  - Abnormal; Notable for the following components:      Result Value   BUN 45 (*)    Creatinine, Ser 4.71 (*)    Calcium 8.2 (*)    GFR, Estimated 13 (*)    All other components within normal limits  CBC - Abnormal; Notable for the following components:   RBC 3.21 (*)    Hemoglobin 9.1 (*)    HCT 28.7 (*)    Platelets 149 (*)    All other components within normal limits  TROPONIN I (HIGH SENSITIVITY)  TROPONIN I (HIGH SENSITIVITY)    EKG EKG Interpretation  Date/Time:  Saturday November 19 2020 00:54:17 EST Ventricular Rate:  55 PR Interval:  234 QRS Duration: 158 QT Interval:  569 QTC Calculation: 545 R Axis:   -62 Text Interpretation: Sinus rhythm Prolonged PR interval RBBB and LAFB Left ventricular hypertrophy When compared with ECG of 11/18/2020, Premature ventricular complexes are no longer present Confirmed by Delora Fuel ([id number]) on 11/19/2020 12:57:34  AM   Radiology DG Chest 2 View  Result Date: 11/18/2020 CLINICAL DATA:  Chest pain. EXAM: CHEST - 2 VIEW COMPARISON:  Chest x-ray 09/16/2018 FINDINGS: The heart is mildly enlarged. There is mild tortuosity and calcification of the thoracic aorta. Central vascular congestion and interstitial pulmonary edema with suspected small effusions consistent with CHF. No infiltrates or pneumothorax. IMPRESSION: CHF. Electronically Signed   By: Marijo Sanes M.D.   On: 11/18/2020 19:29    Procedures Procedures   Medications Ordered in ED Medications - No data to display  ED Course  I have reviewed the triage vital signs and the nursing notes.  Pertinent labs & imaging results that were available during my care of the patient were reviewed by me and considered in my medical decision making (see chart for details).  MDM Rules/Calculators/A&P Episode of chest pain.  ECG shows no acute changes, chest x-ray is consistent with heart failure.  Troponin is normal x2, other labs are normal as well.  Old records are reviewed, and he does  have an echocardiogram from January 2020 which shows grade 2 diastolic dysfunction.  Cardiac catheterization January 2020 showed patent RCA stents, 40-50% occlusion of a diagonal vessel off of the LAD.  He is felt to be safe for discharge and will need to follow-up with his primary care provider and cardiologist.  Final Clinical Impression(s) / ED Diagnoses Final diagnoses:  Nonspecific chest pain  Normochromic normocytic anemia    Rx / DC Orders ED Discharge Orders    None       Delora Fuel, MD 16/10/96 [phone]

## 2020-11-24 ENCOUNTER — Ambulatory Visit: Payer: 59 | Admitting: Cardiology

## 2020-12-02 ENCOUNTER — Telehealth: Payer: Self-pay | Admitting: Orthopedic Surgery

## 2020-12-02 NOTE — Telephone Encounter (Signed)
I called and sw [name] and gave verbal orders as below. Will call with any other questions.

## 2020-12-02 NOTE — Telephone Encounter (Signed)
Morey Hummingbird Investment banker, corporate) from [facility] called requesting verbal orders for the following: Morey Hummingbird needs extended home health for 2x wk for 1 wk and after re-certification orders and skilled nursing 2x a wk for 9 wks. Please call Carrie at [phone]. If unable to answer leave detailed vm on secure line at [phone].

## 2020-12-05 ENCOUNTER — Ambulatory Visit (INDEPENDENT_AMBULATORY_CARE_PROVIDER_SITE_OTHER): Payer: Medicare Other | Admitting: Ophthalmology

## 2020-12-05 ENCOUNTER — Encounter (INDEPENDENT_AMBULATORY_CARE_PROVIDER_SITE_OTHER): Payer: Self-pay | Admitting: Ophthalmology

## 2020-12-05 ENCOUNTER — Other Ambulatory Visit: Payer: Self-pay

## 2020-12-05 DIAGNOSIS — E113412 Type 2 diabetes mellitus with severe nonproliferative diabetic retinopathy with macular edema, left eye: Secondary | ICD-10-CM | POA: Diagnosis not present

## 2020-12-05 DIAGNOSIS — E113411 Type 2 diabetes mellitus with severe nonproliferative diabetic retinopathy with macular edema, right eye: Secondary | ICD-10-CM | POA: Diagnosis not present

## 2020-12-05 MED ORDER — AFLIBERCEPT 2MG/0.05ML IZ SOLN FOR KALEIDOSCOPE
2.0000 mg | INTRAVITREAL | Status: AC | PRN
  Administered 2020-12-05: 2 mg via INTRAVITREAL

## 2020-12-05 NOTE — Assessment & Plan Note (Signed)
This condition stable and not recurrent left eye as of today

## 2020-12-05 NOTE — Assessment & Plan Note (Signed)
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

## 2020-12-05 NOTE — Progress Notes (Signed)
12/05/2020     CHIEF COMPLAINT Patient presents for Retina Follow Up (7 Wk F/U OD, poss Eylea OD//Pt denies noticeable changes to [place] OU since last visit. Pt denies ocular pain, flashes of light, or floaters OU. Pt sts he is about to start dialysis.//)   HISTORY OF PRESENT ILLNESS: [name] is a 65 y.o. male who presents to the clinic today for:   HPI    Retina Follow Up    Patient presents with  Diabetic Retinopathy.  In right eye.  This started 7 weeks ago.  Severity is mild.  Duration of 7 weeks.  Since onset it is stable. Additional comments: 7 Wk F/U OD, poss Eylea OD  Pt denies noticeable changes to [place] OU since last visit. Pt denies ocular pain, flashes of light, or floaters OU. Pt sts he is about to start dialysis.         Last edited by Rockie Neighbours, [place] on 12/05/2020  2:55 PM. (History)      Referring physician: [name], PA-C [place],  [place] [address]  HISTORICAL INFORMATION:   Selected notes from the MEDICAL RECORD NUMBER    Lab Results  Component Value Date   HGBA1C 5.7 (H) 09/16/2018     CURRENT MEDICATIONS: Current Outpatient Medications (Ophthalmic Drugs)  Medication Sig  . Polyvinyl Alcohol-Povidone PF 1.4-0.6 % SOLN Place 1 drop into both eyes 2 (two) times daily as needed (dry eyes).    No current facility-administered medications for this visit. (Ophthalmic Drugs)   Current Outpatient Medications (Other)  Medication Sig  . amLODipine (NORVASC) 10 MG tablet Take 1 tablet (10 mg total) by mouth daily.  Marland Kitchen aspirin EC 81 MG tablet Take 81 mg by mouth daily.  Marland Kitchen atorvastatin (LIPITOR) 40 MG tablet Take 40 mg by mouth at bedtime.   Marland Kitchen BIDIL 20-37.5 MG tablet Take 1 tablet by mouth 3 (three) times daily.  . carvedilol (COREG) 25 MG tablet Take 1.5 tablets (37.5 mg total) by mouth 2 (two) times daily.  . clobetasol (TEMOVATE) 0.05 % external solution Apply 1 application topically daily as needed (dry scalp).  . clopidogrel  (PLAVIX) 75 MG tablet Take 75 mg by mouth daily.   . fentaNYL (DURAGESIC - DOSED MCG/HR) 50 MCG/HR Place 50 mcg onto the skin every 3 (three) days.  . ferrous gluconate (FERGON) 324 MG tablet Take 324 mg by mouth 2 (two) times daily.  . fluticasone (CUTIVATE) 0.05 % cream Apply 1 application topically 2 (two) times daily as needed (dry skin on face).   . folic acid (FOLVITE) 1 MG tablet Take 1 mg by mouth daily.  . Glucosamine HCl (GLUCOSAMINE PO) Take 1 tablet by mouth 2 (two) times daily.   Marland Kitchen HUMALOG KWIKPEN 100 UNIT/ML KiwkPen Inject 15-16 Units into the skin See admin instructions. Inject 13 units before breakfast, lunch, & dinner--  . insulin glargine (LANTUS) 100 UNIT/ML injection Inject 55 Units into the skin at bedtime.  Marland Kitchen ketoconazole (NIZORAL) 2 % cream Apply 1 application topically 2 (two) times daily as needed for irritation.   Marland Kitchen linaclotide (LINZESS) 145 MCG CAPS capsule Take 145 mcg by mouth daily as needed (constipation).   Marland Kitchen loratadine (CLARITIN) 10 MG tablet Take 10 mg by mouth daily.  . Multiple Vitamin (MULTIVITAMIN) tablet Take 1 tablet by mouth daily.  Marland Kitchen NEEDLE, REUSABLE, 22 G 22G X 1-1/2" MISC 1 Units by Does not apply route as directed. For B12 IM inj  . omega-3  acid ethyl esters (LOVAZA) 1 g capsule Take 2 g by mouth 2 (two) times daily.  . ondansetron (ZOFRAN) 4 MG tablet Take 4 mg by mouth every 6 (six) hours as needed for nausea or vomiting.  . pantoprazole (PROTONIX) 40 MG tablet Take 40 mg by mouth daily.  . pregabalin (LYRICA) 100 MG capsule Take 1 capsule (100 mg total) by mouth 3 (three) times daily.  Marland Kitchen rOPINIRole (REQUIP) 2 MG tablet Take 2 mg by mouth at bedtime.   . Syringe/Needle, Disp, (SYRINGE 3CC/22GX1-1/2") 22G X 1-1/2" 3 ML MISC 1 Syringe by Does not apply route as directed. For b12 IM inj  . tamsulosin (FLOMAX) 0.4 MG CAPS capsule Take 0.4 mg by mouth daily.  Marland Kitchen torsemide (DEMADEX) 20 MG tablet Take 20 mg by mouth See admin instructions. Take 20 mg daily,  may take a second 20 mg dose before 1500 as needed for swelling   No current facility-administered medications for this visit. (Other)      REVIEW OF SYSTEMS:    ALLERGIES Allergies  Allergen Reactions  . Contrast Media [Iodinated Diagnostic Agents]     Difficulty breathing    . Ivp Dye [Iodinated Diagnostic Agents] Anaphylaxis and Other (See Comments)    Breathing problems, altered mental state   . Adhesive [Tape] Rash    Rash after 3-4 days use    PAST MEDICAL HISTORY Past Medical History:  Diagnosis Date  . Carotid artery occlusion 11/10/10   LEFT CAROTID ENDARTERECTOMY  . Chronic kidney disease   . Complication of anesthesia    BP WENT UP AT [facility] "  . COPD (chronic obstructive pulmonary disease) ([place])    pt denies this dx as of 06/01/20 - no inhaler   . Diabetes mellitus without complication ([place])   . Diverticulitis   . Diverticulosis of colon (without mention of hemorrhage)   . DJD (degenerative joint disease)    knees/hands/feet/back/neck  . Fatty liver   . Full dentures   . GERD (gastroesophageal reflux disease)   . H/O hiatal hernia   . History of blood transfusion    with a past surical procedure per patient 06/01/20  . Hyperlipidemia   . Hypertension   . Neuromuscular disorder ([place])    peripheral neuropathy  . Non-pressure chronic ulcer of other part of left foot limited to breakdown of skin ([place]) 11/12/2016  . Osteomyelitis ([place])    left 5th metatarsal  . PAD (peripheral artery disease) ([place])    Distal aortogram June 2012. Atherectomy left popliteal artery July 2012.   . Pseudoclaudication 11/15/2018  . Sleep apnea    pt denies this dx as of 06/01/20  . Slurred speech    AS PER WIFE IN D/C NOTE 11/10/10  . 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 Unstable angina ([place]) 09/16/2018  . Wears glasses    Past Surgical History:  Procedure Laterality Date  . AMPUTATION  11/05/2011   Procedure: AMPUTATION RAY;  Surgeon: Wylene Simmer, MD;  Location: [facility];  Service:  Orthopedics;  Laterality: Right;  Amputation of Right 4&5th Toes  . AMPUTATION Left 11/26/2012   Procedure: AMPUTATION RAY;  Surgeon: Wylene Simmer, MD;  Location: [facility];  Service: Orthopedics;  Laterality: Left;  fourth ray amputation  . AMPUTATION Right 08/27/2014   Procedure: Transmetatarsal Amputation;  Surgeon: Newt Minion, MD;  Location: [facility];  Service: Orthopedics;  Laterality: Right;  . AMPUTATION Right 01/14/2015   Procedure: AMPUTATION BELOW KNEE;  Surgeon: Newt Minion, MD;  Location: [facility];  Service: Orthopedics;  Laterality: Right;  . AMPUTATION Left 10/21/2015   Procedure: Left Foot 5th Ray Amputation;  Surgeon: Newt Minion, MD;  Location: [facility];  Service: Orthopedics;  Laterality: Left;  . ANTERIOR FUSION CERVICAL SPINE  02/06/06   C4-5, C5-6, C6-7; SURGEON DR. MAX COHEN  . AV FISTULA PLACEMENT Left 06/02/2020   Procedure: ARTERIOVENOUS (AV) FISTULA CREATION LEFT;  Surgeon: Waynetta Sandy, MD;  Location: [facility];  Service: Vascular;  Laterality: Left;  . BACK SURGERY     x 3  . BASCILIC VEIN TRANSPOSITION Left 07/21/2020   Procedure: LEFT UPPER ARM ATERIOVENOUS SUPERFISTULALIZATION;  Surgeon: Waynetta Sandy, MD;  Location: [facility];  Service: Vascular;  Laterality: Left;  . BELOW KNEE LEG AMPUTATION Right   . CARDIAC CATHETERIZATION  10/31/04   2009  . CAROTID ENDARTERECTOMY  11/10/10  . CAROTID ENDARTERECTOMY Left 11/10/2010   Subtotal occlusion of left internal carotid artery with left hemispheric transient ischemic attacks.  . CAROTID STENT    . CARPAL TUNNEL RELEASE Right 10/21/2013   Procedure: RIGHT CARPAL TUNNEL RELEASE;  Surgeon: Wynonia Sours, MD;  Location: [facility];  Service: Orthopedics;  Laterality: Right;  . CHOLECYSTECTOMY    . COLON SURGERY    . COLONOSCOPY    . COLOSTOMY REVERSAL  05/21/2018   ileostomy reversal  . CYSTOSCOPY WITH STENT PLACEMENT Bilateral 01/13/2018   Procedure: CYSTOSCOPY WITH BILATERAL URETERAL CATHETER  PLACEMENT;  Surgeon: Ardis Hughs, MD;  [facility];  Service: Urology;  Laterality: Bilateral;  . ESOPHAGEAL MANOMETRY Bilateral 07/19/2014   Procedure: ESOPHAGEAL MANOMETRY (EM);  Surgeon: Jerene Bears, MD;  [facility];  Service: Gastroenterology;  Laterality: Bilateral;  . EYE SURGERY Bilateral 2020   cataract  . FEMORAL ARTERY STENT     x6  . FINGER SURGERY    . FOOT SURGERY  04/25/2016    EXCISION BASE 5TH METATARSAL AND PARTIAL CUBOID LEFT FOOT  . HERNIA REPAIR     LEFT INGUINAL AND UMBILICAL REPAIRS  . HERNIA REPAIR    . I & D EXTREMITY Left 04/25/2016   Procedure: EXCISION BASE 5TH METATARSAL AND PARTIAL CUBOID LEFT FOOT;  Surgeon: Newt Minion, MD;  Location: [facility];  Service: Orthopedics;  Laterality: Left;  . ILEOSTOMY  01/13/2018   Procedure: ILEOSTOMY;  Surgeon: Clovis Riley, MD;  [facility];  Service: General;;  . ILEOSTOMY CLOSURE N/A 05/21/2018   Procedure: ILEOSTOMY REVERSAL ERAS PATHWAY;  Surgeon: Clovis Riley, MD;  Location: [facility];  Service: General;  Laterality: N/A;  . IR RADIOLOGIST EVAL & MGMT  11/19/2017  . IR RADIOLOGIST EVAL & MGMT  12/03/2017  . IR RADIOLOGIST EVAL & MGMT  12/18/2017  . JOINT REPLACEMENT Right 2001   Total knee  . LAMINECTOMY     X 3 LUMBAR AND X 2 CERVICAL SPINE OPERATIONS  . LAPAROSCOPIC CHOLECYSTECTOMY W/ CHOLANGIOGRAPHY  11/09/04   SURGEON DR. Luella Cook  . LEFT HEART CATH AND CORONARY ANGIOGRAPHY N/A 09/16/2018   Procedure: LEFT HEART CATH AND CORONARY ANGIOGRAPHY;  Surgeon: Nigel Mormon, MD;  Location: [facility];  Service: Cardiovascular;  Laterality: N/A;  . LEFT HEART CATHETERIZATION WITH CORONARY ANGIOGRAM N/A 10/29/2014   Procedure: LEFT HEART CATHETERIZATION WITH CORONARY ANGIOGRAM;  Surgeon: Laverda Page, MD;  Location: [facility] C[facility] LAB;  Service: Cardiovascular;  Laterality: N/A;  . LIGATION OF COMPETING BRANCHES OF ARTERIOVENOUS FISTULA Left 07/21/2020   Procedure: LIGATION  OF  COMPETING BRANCHES OF LEFT UPPER ARM ARTERIOVENOUS FISTULA;  Surgeon: Waynetta Sandy, MD;  Location: [facility];  Service: Vascular;  Laterality: Left;  . LOWER EXTREMITY ANGIOGRAM N/A 03/19/2012   Procedure: LOWER EXTREMITY ANGIOGRAM;  Surgeon: Burnell Blanks, MD;  Location: [facility] CATH LAB;  Service: Cardiovascular;  Laterality: N/A;  . NECK SURGERY    . PARTIAL COLECTOMY N/A 01/13/2018   Procedure: LAPAROSCOPIC ASSISTED   SIGMOID COLECTOMY ILEOSTOMY;  Surgeon: Clovis Riley, MD;  [facility];  Service: General;  Laterality: N/A;  . PENILE PROSTHESIS IMPLANT  08/14/05   INFRAPUBIC INSERTION OF INFLATABLE PENILE PROSTHESIS; SURGEON DR. Amalia Hailey  . PENILE PROSTHESIS IMPLANT    . PERCUTANEOUS CORONARY STENT INTERVENTION (PCI-S) Right 10/29/2014   Procedure: PERCUTANEOUS CORONARY STENT INTERVENTION (PCI-S);  Surgeon: Laverda Page, MD;  Location: [facility] CATH LAB;  Service: Cardiovascular;  Laterality: Right;  . SHOULDER ARTHROSCOPY    . SPINE SURGERY    . TOE AMPUTATION Left   . TONSILLECTOMY    . TOTAL KNEE ARTHROPLASTY  07/2002   RIGHT KNEE ; SURGEON  DR. [name] ALSO HAD ARTHROSCOPIC RIGHT KNEE IN  10/2001  . TOTAL KNEE ARTHROPLASTY    . ULNAR NERVE TRANSPOSITION Right 10/21/2013   Procedure: RIGHT ELBOW  ULNAR NERVE DECOMPRESSION;  Surgeon: Wynonia Sours, MD;  Location: [facility];  Service: Orthopedics;  Laterality: Right;    FAMILY HISTORY Family History  Problem Relation Age of Onset  . Heart disease Father        Before age 47-  CAD, BPG  . Diabetes Father        Amputation  . Cancer Father        PROSTATE  . Hyperlipidemia Father   . Hypertension Father   . Heart attack Father        Triple BPG  . Varicose Veins Father   . Colon cancer Brother   . Diabetes Brother   . Heart disease Brother 79       A-Fib. Before age 34  . Hyperlipidemia Brother   . Hypertension Brother   . Cancer Sister        Breast  . Hyperlipidemia Sister   . Hypertension  Sister   . Hypertension Son   . Arthritis Other        GRANDMOTHER  . Hypertension Other        OTHER FAMILY MEMBERS    SOCIAL HISTORY Social History   Tobacco Use  . Smoking status: Former Smoker    Packs/day: 2.00    Years: 35.00    Pack years: 70.00    Types: Cigarettes    Quit date: 10/28/2011    Years since quitting: 9.1  . Smokeless tobacco: Never Used  Vaping Use  . Vaping Use: Some days  . Substances: Nicotine  Substance Use Topics  . Alcohol use: Not Currently    Comment: "not in a long time"  . Drug use: Never         OPHTHALMIC EXAM:  Base Eye Exam    Visual Acuity (ETDRS)      Right Left   Dist [place] 20/200 -1 20/20 -1   Dist ph [place] 20/100 -2        Tonometry (Tonopen, 2:47 PM)      Right Left   Pressure 12 15       Pupils      Pupils Dark Light Shape React APD   Right PERRL 3 2  Round Brisk None   Left PERRL 3 2 Round Brisk None       Visual Fields (Counting fingers)      Left Right    Full Full       Extraocular Movement      Right Left    Full Full       Neuro/Psych    Oriented x3: Yes   Mood/Affect: Normal       Dilation    Right eye: 1.0% Mydriacyl, 2.5% Phenylephrine @ 2:58 PM        Slit Lamp and Fundus Exam    External Exam      Right Left   External Normal Normal       Slit Lamp Exam      Right Left   Lids/Lashes Normal Normal   Conjunctiva/Sclera White and quiet White and quiet   Cornea Clear Clear   Anterior Chamber Deep and quiet Deep and quiet   Iris Round and reactive Round and reactive   Lens Posterior chamber intraocular lens Posterior chamber intraocular lens   Anterior Vitreous Normal Normal       Fundus Exam      Right Left   Posterior Vitreous Normal    Disc Normal    C/D Ratio 0.4    Macula Exudates, Microaneurysms, massive clinically apparent clinically significant macular edema    Vessels NPDR severe, with numerous dot blot hemorrhages, VCABS, Irma    Periphery Normal, good PRP nasal retina            IMAGING AND PROCEDURES  Imaging and Procedures for 12/05/20  OCT, Retina - OU - Both Eyes       Right Eye Quality was good. Scan locations included subfoveal. Central Foveal Thickness: 809. Progression has improved. Findings include abnormal foveal contour, cystoid macular edema.   Left Eye Quality was good. Scan locations included subfoveal. Central Foveal Thickness: 283. Progression has been stable. Findings include normal foveal contour.   Notes [facility] CSME CME has recurred in the right eye, is possibly some influence of "renal retinopathy", uremia superimposed upon diabetic retinopathy.  nonetheless intravitreal Eylea required today follow-up in 5 to 6 weeks       Intravitreal Injection, Pharmacologic Agent - OD - Right Eye       Time Out 12/05/2020. 3:26 PM. Confirmed correct patient, procedure, site, and patient consented.   Anesthesia Topical anesthesia was used. Anesthetic medications included [place] 3.5%.   Procedure Preparation included 5% betadine to ocular surface, 10% betadine to eyelids, Ofloxacin . A 30 gauge needle was used.   Injection:  2 mg aflibercept Alfonse Flavors) SOLN   NDC: 95284-132-44   Route: Intravitreal, Site: Right Eye, Waste: 0 mg  Post-op Post injection exam found visual acuity of at least counting fingers. The patient tolerated the procedure well. There were no complications. The patient received written and verbal post procedure care education. Post injection medications were not given.                 ASSESSMENT/PLAN:  Severe nonproliferative diabetic retinopathy of left eye, with macular edema, associated with type 2 diabetes mellitus (HCC) This condition stable and not recurrent left eye as of today  Severe nonproliferative diabetic retinopathy of right eye, with macular edema, associated with type 2 diabetes mellitus (HCC) [facility] CSME has recurred OD, at 7-week follow-up post Eylea.  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 Severe nonproliferative diabetic retinopathy of right eye, with macular edema, associated with type 2 diabetes mellitus (HCC)  E11.3411 OCT, Retina - OU - Both Eyes    Intravitreal Injection, Pharmacologic Agent - OD - Right Eye    aflibercept (EYLEA) SOLN 2 mg  2. Severe nonproliferative diabetic retinopathy of left eye, with macular edema, associated with type 2 diabetes mellitus ([place])  D22.0254     1.  Repeat intravitreal Eylea OD today up in 5 to 6 weeks  2.  OS, stable will observe  3.  Patient reports he is going to commence with dialysis soon.  I explained to him he is likely to lose tens of pounds in addition decreased systemic volume will not stress the macular perfusion as much, thus helping to improve control of his macular edema.  As an aside also explained the patient he is likely to feel much better after dialysis started, as it improves mental awareness and sense of wellbeing  Ophthalmic Meds Ordered this visit:  Meds ordered this encounter  Medications  . aflibercept (EYLEA) SOLN 2 mg       Return in about 6 weeks (around 01/16/2021) for dilate, OD, EYLEA OCT.  There are no Patient Instructions on file for this visit.   Explained the diagnoses, plan, and follow up with the patient and they expressed understanding.  Patient expressed understanding of the importance of proper follow up care.   Clent Demark Ladonte Verstraete M.D. Diseases & Surgery of the Retina and Vitreous [facility] [place] 12/05/20     Abbreviations: M myopia (nearsighted); A astigmatism; H hyperopia (farsighted); P presbyopia; Mrx spectacle prescription;  CTL contact lenses; OD right eye; OS left eye; OU both eyes  XT exotropia; ET esotropia; PEK punctate epithelial keratitis; PEE punctate epithelial erosions; DES dry eye syndrome; MGD meibomian gland dysfunction; ATs artificial tears; PFAT's preservative free  artificial tears; [place] nuclear sclerotic cataract; PSC posterior subcapsular cataract; ERM epi-retinal membrane; PVD posterior vitreous detachment; RD retinal detachment; DM diabetes mellitus; DR diabetic retinopathy; NPDR non-proliferative diabetic retinopathy; PDR proliferative diabetic retinopathy; CSME clinically significant macular edema; DME diabetic macular edema; dbh dot blot hemorrhages; CWS cotton wool spot; POAG primary open angle glaucoma; C/D cup-to-disc ratio; HVF humphrey visual field; GVF goldmann visual field; OCT optical coherence tomography; IOP intraocular pressure; BRVO Branch retinal vein occlusion; CRVO central retinal vein occlusion; CRAO central retinal artery occlusion; BRAO branch retinal artery occlusion; RT retinal tear; SB scleral buckle; PPV pars plana vitrectomy; VH Vitreous hemorrhage; PRP panretinal laser photocoagulation; IVK intravitreal kenalog; VMT vitreomacular traction; MH Macular hole;  NVD neovascularization of the disc; NVE neovascularization elsewhere; AREDS age related eye disease study; ARMD age related macular degeneration; POAG primary open angle glaucoma; EBMD epithelial/anterior basement membrane dystrophy; ACIOL anterior chamber intraocular lens; IOL intraocular lens; PCIOL posterior chamber intraocular lens; Phaco/IOL phacoemulsification with intraocular lens placement; [place] photorefractive keratectomy; LASIK laser assisted in situ keratomileusis; HTN hypertension; DM diabetes mellitus; COPD chronic obstructive pulmonary disease

## 2020-12-12 ENCOUNTER — Encounter: Payer: Self-pay | Admitting: Cardiology

## 2020-12-12 ENCOUNTER — Encounter: Payer: Self-pay | Admitting: Orthopedic Surgery

## 2020-12-12 ENCOUNTER — Ambulatory Visit: Payer: 59 | Admitting: Cardiology

## 2020-12-12 ENCOUNTER — Ambulatory Visit (INDEPENDENT_AMBULATORY_CARE_PROVIDER_SITE_OTHER): Payer: Medicare Other | Admitting: Orthopedic Surgery

## 2020-12-12 ENCOUNTER — Other Ambulatory Visit: Payer: Self-pay

## 2020-12-12 VITALS — BP 143/56 | HR 62 | Temp 98.2°F | Resp 16 | Ht 74.0 in

## 2020-12-12 VITALS — Ht 74.0 in | Wt 272.0 lb

## 2020-12-12 DIAGNOSIS — I5032 Chronic diastolic (congestive) heart failure: Secondary | ICD-10-CM

## 2020-12-12 DIAGNOSIS — Z992 Dependence on renal dialysis: Secondary | ICD-10-CM

## 2020-12-12 DIAGNOSIS — N186 End stage renal disease: Secondary | ICD-10-CM

## 2020-12-12 DIAGNOSIS — I2510 Atherosclerotic heart disease of native coronary artery without angina pectoris: Secondary | ICD-10-CM

## 2020-12-12 DIAGNOSIS — I10 Essential (primary) hypertension: Secondary | ICD-10-CM

## 2020-12-12 DIAGNOSIS — L97221 Non-pressure chronic ulcer of left calf limited to breakdown of skin: Secondary | ICD-10-CM

## 2020-12-12 DIAGNOSIS — I453 Trifascicular block: Secondary | ICD-10-CM

## 2020-12-12 DIAGNOSIS — I739 Peripheral vascular disease, unspecified: Secondary | ICD-10-CM

## 2020-12-12 NOTE — Progress Notes (Signed)
Primary Physician/Referring:  [name], PA-C  Patient ID: [name], male    DOB: 30-Nov-1955, 65 y.o.   MRN: [id number]  Chief Complaint  Patient presents with  . Chest Pain  . Coronary Artery Disease   HPI:    [name]  is a 65 y.o. male  with severe peripheral arterial disease and has history of right below-knee amputation and toe lamputations on the left, multiple peripheral interventions in the past, uncontrolled diabetes mellitus, ESRD, severe diabetic retinopathy, coronary artery disease angioplasty to the right coronary artery on 10/29/2014, right carotid endarterectomy in 2012 , OSA Unable to tolerate CPAP, chronic back pain and neck and back surgery in past and on chronic pain medications. Coronary Angiography 09/17/2018 revealed widely patent stent and no significant new disease.    Patient presents for 22-month follow-up of coronary artery disease and peripheral arterial disease.  Complains of left-sided chest pain which he states worsens with deep cough and deep inspiration.  He was seen in the emergency room and discharged home a week ago.  He has become much more disabled since I last saw him 6 months ago.  He is now in a wheelchair, has marked left leg edema and is wrapped in Ace wrap.  No wound, no ulceration.  He has also developed worsening renal function and has been recommended to start dialysis. He does continue to use e-cigarettes regularly.  He is also being followed by Dr. Madelon Lips for renal failure.  Past Medical History:  Diagnosis Date  . Carotid artery occlusion 11/10/10   LEFT CAROTID ENDARTERECTOMY  . Chronic kidney disease   . Complication of anesthesia    BP WENT UP AT [facility] "  . COPD (chronic obstructive pulmonary disease) ([place])    pt denies this dx as of 06/01/20 - no inhaler   . Diabetes mellitus without complication ([place])   . Diverticulitis   . Diverticulosis of colon (without mention of hemorrhage)   . DJD (degenerative joint disease)     knees/hands/feet/back/neck  . Fatty liver   . Full dentures   . GERD (gastroesophageal reflux disease)   . H/O hiatal hernia   . History of blood transfusion    with a past surical procedure per patient 06/01/20  . Hyperlipidemia   . Hypertension   . Neuromuscular disorder ([place])    peripheral neuropathy  . Non-pressure chronic ulcer of other part of left foot limited to breakdown of skin ([place]) 11/12/2016  . Osteomyelitis ([place])    left 5th metatarsal  . PAD (peripheral artery disease) ([place])    Distal aortogram June 2012. Atherectomy left popliteal artery July 2012.   . Pseudoclaudication 11/15/2018  . Sleep apnea    pt denies this dx as of 06/01/20  . Slurred speech    AS PER WIFE IN D/C NOTE 11/10/10  . 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 Unstable angina ([place]) 09/16/2018  . Wears glasses    Past Surgical History:  Procedure Laterality Date  . AMPUTATION  11/05/2011   Procedure: AMPUTATION RAY;  Surgeon: Wylene Simmer, MD;  Location: [place];  Service: Orthopedics;  Laterality: Right;  Amputation of Right 4&5th Toes  . AMPUTATION Left 11/26/2012   Procedure: AMPUTATION RAY;  Surgeon: Wylene Simmer, MD;  Location: [place];  Service: Orthopedics;  Laterality: Left;  fourth ray amputation  . AMPUTATION Right 08/27/2014   Procedure: Transmetatarsal Amputation;  Surgeon: Newt Minion, MD;  Location: [place];  Service: Orthopedics;  Laterality: Right;  .  AMPUTATION Right 01/14/2015   Procedure: AMPUTATION BELOW KNEE;  Surgeon: Newt Minion, MD;  Location: [place];  Service: Orthopedics;  Laterality: Right;  . AMPUTATION Left 10/21/2015   Procedure: Left Foot 5th Ray Amputation;  Surgeon: Newt Minion, MD;  Location: [place];  Service: Orthopedics;  Laterality: Left;  . ANTERIOR FUSION CERVICAL SPINE  02/06/06   C4-5, C5-6, C6-7; SURGEON DR. MAX COHEN  . AV FISTULA PLACEMENT Left 06/02/2020   Procedure: ARTERIOVENOUS (AV) FISTULA CREATION LEFT;  Surgeon: Waynetta Sandy, MD;  Location: [place];  Service:  Vascular;  Laterality: Left;  . BACK SURGERY     x 3  . BASCILIC VEIN TRANSPOSITION Left 07/21/2020   Procedure: LEFT UPPER ARM ATERIOVENOUS SUPERFISTULALIZATION;  Surgeon: Waynetta Sandy, MD;  Location: [place];  Service: Vascular;  Laterality: Left;  . BELOW KNEE LEG AMPUTATION Right   . CARDIAC CATHETERIZATION  10/31/04   2009  . CAROTID ENDARTERECTOMY  11/10/10  . CAROTID ENDARTERECTOMY Left 11/10/2010   Subtotal occlusion of left internal carotid artery with left hemispheric transient ischemic attacks.  . CAROTID STENT    . CARPAL TUNNEL RELEASE Right 10/21/2013   Procedure: RIGHT CARPAL TUNNEL RELEASE;  Surgeon: Wynonia Sours, MD;  Location: [place];  Service: Orthopedics;  Laterality: Right;  . CHOLECYSTECTOMY    . COLON SURGERY    . COLONOSCOPY    . COLOSTOMY REVERSAL  05/21/2018   ileostomy reversal  . CYSTOSCOPY WITH STENT PLACEMENT Bilateral 01/13/2018   Procedure: CYSTOSCOPY WITH BILATERAL URETERAL CATHETER PLACEMENT;  Surgeon: Ardis Hughs, MD;  Location: [facility];  Service: Urology;  Laterality: Bilateral;  . ESOPHAGEAL MANOMETRY Bilateral 07/19/2014   Procedure: ESOPHAGEAL MANOMETRY (EM);  Surgeon: Jerene Bears, MD;  [facility];  Service: Gastroenterology;  Laterality: Bilateral;  . EYE SURGERY Bilateral 2020   cataract  . FEMORAL ARTERY STENT     x6  . FINGER SURGERY    . FOOT SURGERY  04/25/2016    EXCISION BASE 5TH METATARSAL AND PARTIAL CUBOID LEFT FOOT  . HERNIA REPAIR     LEFT INGUINAL AND UMBILICAL REPAIRS  . HERNIA REPAIR    . I & D EXTREMITY Left 04/25/2016   Procedure: EXCISION BASE 5TH METATARSAL AND PARTIAL CUBOID LEFT FOOT;  Surgeon: Newt Minion, MD;  Location: [place];  Service: Orthopedics;  Laterality: Left;  . ILEOSTOMY  01/13/2018   Procedure: ILEOSTOMY;  Surgeon: Clovis Riley, MD;  Location: [facility];  Service: General;;  . ILEOSTOMY CLOSURE N/A 05/21/2018   Procedure: ILEOSTOMY REVERSAL ERAS PATHWAY;  Surgeon:  Clovis Riley, MD;  Location: [place];  Service: General;  Laterality: N/A;  . IR RADIOLOGIST EVAL & MGMT  11/19/2017  . IR RADIOLOGIST EVAL & MGMT  12/03/2017  . IR RADIOLOGIST EVAL & MGMT  12/18/2017  . JOINT REPLACEMENT Right 2001   Total knee  . LAMINECTOMY     X 3 LUMBAR AND X 2 CERVICAL SPINE OPERATIONS  . LAPAROSCOPIC CHOLECYSTECTOMY W/ CHOLANGIOGRAPHY  11/09/04   SURGEON DR. Luella Cook  . LEFT HEART CATH AND CORONARY ANGIOGRAPHY N/A 09/16/2018   Procedure: LEFT HEART CATH AND CORONARY ANGIOGRAPHY;  Surgeon: Nigel Mormon, MD;  Location: [place] [facility];  Service: Cardiovascular;  Laterality: N/A;  . LEFT HEART CATHETERIZATION WITH CORONARY ANGIOGRAM N/A 10/29/2014   Procedure: LEFT HEART CATHETERIZATION WITH CORONARY ANGIOGRAM;  Surgeon: Laverda Page, MD;  Location: [facility] CATH LAB;  Service:  Cardiovascular;  Laterality: N/A;  . LIGATION OF COMPETING BRANCHES OF ARTERIOVENOUS FISTULA Left 07/21/2020   Procedure: LIGATION OF COMPETING BRANCHES OF LEFT UPPER ARM ARTERIOVENOUS FISTULA;  Surgeon: Waynetta Sandy, MD;  Location: [place];  Service: Vascular;  Laterality: Left;  . LOWER EXTREMITY ANGIOGRAM N/A 03/19/2012   Procedure: LOWER EXTREMITY ANGIOGRAM;  Surgeon: Burnell Blanks, MD;  Location: [facility] C[facility];  Service: Cardiovascular;  Laterality: N/A;  . NECK SURGERY    . PARTIAL COLECTOMY N/A 01/13/2018   Procedure: LAPAROSCOPIC ASSISTED   SIGMOID COLECTOMY ILEOSTOMY;  Surgeon: Clovis Riley, MD;  Location[facility];  Service: General;  Laterality: N/A;  . PENILE PROSTHESIS IMPLANT  08/14/05   INFRAPUBIC INSERTION OF INFLATABLE PENILE PROSTHESIS; SURGEON DR. Amalia Hailey  . PENILE PROSTHESIS IMPLANT    . PERCUTANEOUS CORONARY STENT INTERVENTION (PCI-S) Right 10/29/2014   Procedure: PERCUTANEOUS CORONARY STENT INTERVENTION (PCI-S);  Surgeon: Laverda Page, MD;  Location: [facility] CATH LAB;  Service: Cardiovascular;  Laterality: Right;  . SHOULDER ARTHROSCOPY    . SPINE  SURGERY    . TOE AMPUTATION Left   . TONSILLECTOMY    . TOTAL KNEE ARTHROPLASTY  07/2002   RIGHT KNEE ; SURGEON  DR. [name] ALSO HAD ARTHROSCOPIC RIGHT KNEE IN  10/2001  . TOTAL KNEE ARTHROPLASTY    . ULNAR NERVE TRANSPOSITION Right 10/21/2013   Procedure: RIGHT ELBOW  ULNAR NERVE DECOMPRESSION;  Surgeon: Wynonia Sours, MD;  Location: [place];  Service: Orthopedics;  Laterality: Right;   Social History   Tobacco Use  . Smoking status: Former Smoker    Packs/day: 2.00    Years: 35.00    Pack years: 70.00    Types: Cigarettes    Quit date: 10/28/2011    Years since quitting: 9.1  . Smokeless tobacco: Never Used  Substance Use Topics  . Alcohol use: Not Currently    Comment: "not in a long time"   Marital Status: Married   ROS  Review of Systems  Cardiovascular: Positive for leg swelling. Negative for chest pain, dyspnea on exertion, palpitations and syncope.  Hematologic/Lymphatic: Does not bruise/bleed easily.  Musculoskeletal: Positive for arthritis and back pain.  Gastrointestinal: Negative for melena.   Objective   Vitals with BMI 12/12/2020 12/12/2020 11/19/2020  Height 6\' 2"  6\' 2"  -  Weight 272 lbs - -  BMI 77.11 - -  Systolic - 657 903  Diastolic - 56 48  Pulse - 62 52    Blood pressure (!) 143/56, pulse 62, temperature 98.2 F (36.8 C), resp. rate 16, height 6\' 2"  (1.88 m), SpO2 98 %. Body mass index is 34.92 kg/m.   Physical Exam Constitutional:      General: He is not in acute distress.    Comments: Well built and moderately obese in no acute distress  Cardiovascular:     Rate and Rhythm: Normal rate and regular rhythm.     Pulses: Intact distal pulses.          Carotid pulses are on the right side with bruit and on the left side with bruit.      Femoral pulses are 1+ on the right side with bruit and 1+ on the left side with bruit.      Popliteal pulses are 0 on the right side and 0 on the left side.       Right dorsalis pedis pulse not  accessible and left dorsalis pedis pulse not accessible.  Right posterior tibial pulse not accessible and left posterior tibial pulse not accessible.     Heart sounds: Normal heart sounds. No murmur heard. No gallop.      Comments: Right BKA. There is 2-3+ left leg pitting edema below knee. No JVD The left leg is in ACE wrap (patient states no open wounds) Left brachial artery AV Shunt noted Pulmonary:     Effort: Pulmonary effort is normal.     Breath sounds: Normal breath sounds.  Abdominal:     General: Bowel sounds are normal.     Palpations: Abdomen is soft.  Musculoskeletal:     Cervical back: Neck supple.    Radiology: No results found.  Laboratory examination:   Recent Labs    05/30/20 1646 06/02/20 0627 07/21/20 0721 11/18/20 1831  NA 139 139 140 138  K 4.0 3.5 4.0 4.2  CL 106 106 107 108  CO2 19*  --   --  23  GLUCOSE 226* 130* 163* 80  BUN 27 32* 38* 45*  CREATININE 4.28* 5.10* 4.90* 4.71*  CALCIUM 7.9*  --   --  8.2*  GFRNONAA 14*  --   --  13*  GFRAA 16*  --   --   --    CMP Latest Ref Rng & Units 11/18/2020 07/21/2020 06/02/2020  Glucose 70 - 99 mg/dL 80 163(H) 130(H)  BUN 8 - 23 mg/dL 45(H) 38(H) 32(H)  Creatinine 0.61 - 1.24 mg/dL 4.71(H) 4.90(H) 5.10(H)  Sodium 135 - 145 mmol/L 138 140 139  Potassium 3.5 - 5.1 mmol/L 4.2 4.0 3.5  Chloride 98 - 111 mmol/L 108 107 106  CO2 22 - 32 mmol/L 23 - -  Calcium 8.9 - 10.3 mg/dL 8.2(L) - -  Total Protein 6.0 - 8.5 g/dL - - -  Total Bilirubin 0.0 - 1.2 mg/dL - - -  Alkaline Phos 44 - 121 IU/L - - -  AST 0 - 40 IU/L - - -  ALT 0 - 44 IU/L - - -   CBC Latest Ref Rng & Units 11/18/2020 10/17/2020 10/03/2020  WBC 4.0 - 10.5 K/uL 7.7 - -  Hemoglobin 13.0 - 17.0 g/dL 9.1(L) 9.8(L) 9.5(L)  Hematocrit 39.0 - 52.0 % 28.7(L) - -  Platelets 150 - 400 K/uL 149(L) - -   Lipid Panel     Component Value Date/Time   CHOL 117 05/30/2020 1646   TRIG 133 05/30/2020 1646   HDL 24 (L) 05/30/2020 1646   LDLCALC 69  05/30/2020 1646   HEMOGLOBIN A1C Lab Results  Component Value Date   HGBA1C 5.7 (H) 09/16/2018   MPG 116.89 09/16/2018   External labs:  A1C 6.100 % 03/30/2020   Hemoglobin 10.800 05/23/2020 Platelets 203.000 x1 05/02/2020  Creatinine, Serum 4.140 mg/ 05/02/2020 Potassium 4.300 mm 05/02/2020 ALT (SGPT) 16.000 IU/ 05/02/2020  Cholesterol, total 142.000 m 08/19/2018 HDL 32.000 mg 08/19/2018 LDL 79.000 mg 08/19/2018 Triglycerides 156.000 m 08/19/2018  A1C 6.900 % 09/02/2019  Creatinine, Serum 3.590 mg/ 10/05/2019 Potassium 4.300 mm 10/05/2019 ALT (SGPT) 12.000 IU/ 10/05/2019  Hemoglobin 9.700 11/09/2019; INR 1.000 01/07/2018 Platelets 232.000 x1 10/05/2019  Medications   Current Outpatient Medications on File Prior to Visit  Medication Sig Dispense Refill  . amLODipine (NORVASC) 10 MG tablet Take 1 tablet (10 mg total) by mouth daily. 90 tablet 1  . aspirin EC 81 MG tablet Take 81 mg by mouth daily.    Marland Kitchen atorvastatin (LIPITOR) 40 MG tablet Take 40 mg by mouth at bedtime.     Marland Kitchen  BIDIL 20-37.5 MG tablet Take 1 tablet by mouth 3 (three) times daily.    . carvedilol (COREG) 25 MG tablet Take 1.5 tablets (37.5 mg total) by mouth 2 (two) times daily. 270 tablet 2  . clobetasol (TEMOVATE) 0.05 % external solution Apply 1 application topically daily as needed (dry scalp).    . clopidogrel (PLAVIX) 75 MG tablet Take 75 mg by mouth daily.     . fentaNYL (DURAGESIC - DOSED MCG/HR) 50 MCG/HR Place 50 mcg onto the skin every 3 (three) days.    . ferrous gluconate (FERGON) 324 MG tablet Take 324 mg by mouth 2 (two) times daily.    . fluticasone (CUTIVATE) 0.05 % cream Apply 1 application topically 2 (two) times daily as needed (dry skin on face).     . folic acid (FOLVITE) 1 MG tablet Take 1 mg by mouth daily.    . Glucosamine HCl (GLUCOSAMINE PO) Take 100 tablets by mouth 2 (two) times daily.    Marland Kitchen HUMALOG KWIKPEN 100 UNIT/ML KiwkPen Inject 15-16 Units into the skin See admin instructions. Inject  13 units before breakfast, lunch, & dinner--  0  . insulin glargine (LANTUS) 100 UNIT/ML injection Inject 55 Units into the skin at bedtime.    Marland Kitchen ketoconazole (NIZORAL) 2 % cream Apply 1 application topically 2 (two) times daily as needed for irritation.   1  . linaclotide (LINZESS) 145 MCG CAPS capsule Take 145 mcg by mouth daily as needed (constipation).     Marland Kitchen loratadine (CLARITIN) 10 MG tablet Take 10 mg by mouth daily.    . Multiple Vitamin (MULTIVITAMIN) tablet Take 1 tablet by mouth daily.    Marland Kitchen NEEDLE, REUSABLE, 22 G 22G X 1-1/2" MISC 1 Units by Does not apply route as directed. For B12 IM inj 10 each 0  . nitroGLYCERIN (NITROSTAT) 0.4 MG SL tablet Place 0.4 mg under the tongue every 5 (five) minutes as needed for chest pain.    Marland Kitchen omega-3 acid ethyl esters (LOVAZA) 1 g capsule Take 2 g by mouth 2 (two) times daily.    . ondansetron (ZOFRAN) 4 MG tablet Take 4 mg by mouth every 6 (six) hours as needed for nausea or vomiting.    . pantoprazole (PROTONIX) 40 MG tablet Take 40 mg by mouth daily.    . Polyvinyl Alcohol-Povidone PF 1.4-0.6 % SOLN Place 1 drop into both eyes 2 (two) times daily as needed (dry eyes).     . pregabalin (LYRICA) 100 MG capsule Take 1 capsule (100 mg total) by mouth 3 (three) times daily. 30 capsule 0  . rOPINIRole (REQUIP) 2 MG tablet Take 2 mg by mouth at bedtime.    . Syringe/Needle, Disp, (SYRINGE 3CC/22GX1-1/2") 22G X 1-1/2" 3 ML MISC 1 Syringe by Does not apply route as directed. For b12 IM inj 10 each 0  . tamsulosin (FLOMAX) 0.4 MG CAPS capsule Take 0.4 mg by mouth daily.    Marland Kitchen torsemide (DEMADEX) 20 MG tablet Take 20 mg by mouth in the morning and at bedtime. Take on extra for swelling    . oxyCODONE-acetaminophen (PERCOCET) 7.5-325 MG tablet Take 1 tablet by mouth 3 (three) times daily as needed.     No current facility-administered medications on file prior to visit.    Cardiac Studies:   Left popliteal artery atherectomy in June 2012 and left below knee  PTA 2014 by Dr. Brunetta Jeans at [facility].   Lexiscan sestamibi stress test 03/18/2017: 1. The resting electrocardiogram demonstrated normal sinus  rhythm, RBBB and no resting arrhythmias. Stress EKG is non-diagnostic for ischemia as it a pharmacologic stress using Lexiscan. Occasional PVC noted. Stress symptoms included dyspnea, dizziness and chest pain. 2. The LV is dilated both at rest and stress images. The LV end diastolic volume was 258NI. Perfusion images reveal inferior soft tissue attenuation artifact. There is no ischemia or scar. Overall left ventricular systolic function was normal without regional wall motion abnormalities. The left ventricular ejection fraction was calculated to be 51%. This is a low risk study. Compared to the study done on 10/15/2014, inferior wall ischemia and associated inferior hypokinesis is no longer present.  Coronary angiogram 09/17/18: Widely patent stent stents. Normal LVEF. Coronary Angiogram 10/29/2014: CAD s/p stenting of the distal, mid and proximal RCA with implantation of 3 overlapping drug-eluting stent, from distal to proximal 2.5 x 38 mm, 2.75 x 38 mm and a 2.75 x 20 mm Promus premier DES.  Echocardiogram 09/16/2018:   Left ventricle: The cavity size was normal. There was moderate concentric hypertrophy. Systolic function was normal. The estimated ejection fraction was in the range of 55% to 60%. Features are consistent with a pseudonormal left ventricular filling pattern, with concomitant abnormal relaxation and increased filling pressure (grade 2 diastolic dysfunction). Doppler parameters are consistent with both elevated ventricular end-diastolic filling pressure and elevated left atrial filling pressure. Mitral valve: Calcified annulus.  Left atrium: The atrium was mildly dilated. Inferior vena cava: The vessel was dilated. The respirophasic diameter changes was normal, may suggest elevated central venous pressure.  Carotid artery duplex 06/09/2019:  Minimal  stenosis in bilateral ICA of 1-15%. Heterogeneous plaque. Stenosis  in the right external carotid artery (<50%).  Left carotid endarterectomy site is patent.  Right vertebral artery flow is not well visualized. Antegrade left  vertebral artery flow.  No significant change from 03/01/2017. Follow up studies when clinically  Indicated.  EKG:    EKG normal 12/12/2020: Sinus bradycardia at rate of 58 bpm with first-degree AV block, left axis deviation, left anterior fascicular block.  Right bundle branch block.  Borderline criteria for LVH by voltage.  No change from 05/27/2020.  Assessment     ICD-10-CM   1. Coronary artery disease involving native coronary artery of native heart without angina pectoris  I25.10   2. Chronic diastolic heart failure, NYHA class 2 (HCC)  I50.32   3. Essential hypertension  I10 EKG 12-Lead  4. Peripheral artery disease (HCC)  I73.9   5. ESRD on hemodialysis (HCC)  N18.6    Z99.2   6. Trifascicular block  I45.3    No orders of the defined types were placed in this encounter.  There are no discontinued medications.  Recommendations:   TEMESGEN WEIGHTMAN  is a 65 y.o. male  with severe peripheral arterial disease and has history of right below-knee amputation and toe lamputations on the left, multiple peripheral interventions in the past, uncontrolled diabetes mellitus, stage IV CKD, coronary artery disease angioplasty to the right coronary artery on 10/29/2014, right carotid endarterectomy in 2012 , OSA Unable to tolerate CPAP, chronic back pain and neck and back surgery in past and on chronic pain medications.    Patient presents for 54-month follow-up of coronary artery disease and peripheral arterial disease.  He remains angina free, chest pain symptoms with deep cough and deep inspiration on the left side is clearly musculoskeletal.  No symptoms of claudication or open wound. He does have 2-3+ pitting edema of his left lower leg that is chronic.  It is unfortunate  that his overall health status has deteriorated markedly over the past one year.  He is now in end-stage renal disease and will be starting dialysis soon but wanted no opinion.  I communicated with Dr. Hollie Salk who agrees that he should start dialysis soon.  Tobacco use and obesity continues to be major issue and now he is essentially wheel chair bound.   External labs were reviewed, diabetes is well controlled.    EKG today with underlying trifascicular block that is chronic, he is without symptoms.  Therefore no current indication for pacemaker implantation.  OV in 6 months.    Adrian Prows, MD, [facility] 12/12/2020, 8:33 PM Office: [phone]

## 2020-12-13 ENCOUNTER — Encounter: Payer: Self-pay | Admitting: Orthopedic Surgery

## 2020-12-13 NOTE — Progress Notes (Signed)
Office Visit Note   Patient: [name]           Date of Birth: Nov 07, 1955           MRN: [id number] Visit Date: 12/12/2020              Requested by: [name], PA-C [address] [place],  [place] [address] PCP: [name], PA-C  Chief Complaint  Patient presents with  . Left Ankle - Follow-up      HPI: Patient is a 65 year old gentleman with venous insufficiency ulcer left ankle.  Patient has been undergoing 2 times a week compression wraps with home health nursing.  Assessment & Plan: Visit Diagnoses:  1. Skin ulcer of calf, left, limited to breakdown of skin ([place])     Plan: Again discussed the importance of elevation and moving the ankle to work the calf pump.  He will continue with home health nurse and compression wraps 2 times a week.  Dynaflex wrap applied today.  Follow-Up Instructions: Return in about 4 weeks (around 01/09/2021).   Ortho Exam  Patient is alert, oriented, no adenopathy, well-dressed, normal affect, normal respiratory effort. Examination patient still has massive swelling of the left lower extremity with pitting edema there is no cellulitis.  There is a lateral malleolar ulcer that is 2 cm in diameter 5 mm deep this does have healthy granulation tissue.  There is no cellulitis no odor minimal drainage no exposed bone or tendon.  Imaging: No results found. No images are attached to the encounter.  Labs: Lab Results  Component Value Date   HGBA1C 5.7 (H) 09/16/2018   HGBA1C 7.7 (H) 05/15/2018   HGBA1C 7.1 (H) 01/07/2018   ESRSEDRATE 35 (H) 09/03/2016   ESRSEDRATE 111 (H) 05/11/2016   ESRSEDRATE 98 (H) 05/04/2016   CRP 1.2 (H) 09/03/2016   CRP 4.7 (H) 05/11/2016   CRP 8.0 (H) 05/04/2016   REPTSTATUS 11/15/2017 FINAL 11/10/2017   GRAMSTAIN  11/04/2017    ABUNDANT WBC PRESENT, PREDOMINANTLY PMN MODERATE GRAM NEGATIVE RODS MODERATE GRAM POSITIVE COCCI Performed at [facility], [place] [address] [place], [place] [address]     CULT  11/10/2017    NO GROWTH 5 DAYS Performed at [facility] [address] [place], [place] [address]    LABORGA STAPHYLOCOCCUS AUREUS 09/26/2014     Lab Results  Component Value Date   ALBUMIN 3.7 (L) 05/30/2020   ALBUMIN 2.9 (L) 06/19/2019   ALBUMIN 3.5 05/15/2018   PREALBUMIN 19.0 05/04/2016   PREALBUMIN 25.0 12/16/2015    Lab Results  Component Value Date   MG 1.9 05/24/2018   MG 2.1 05/23/2018   MG 1.3 (L) 05/22/2018   No results found for: VD25OH  Lab Results  Component Value Date   PREALBUMIN 19.0 05/04/2016   PREALBUMIN 25.0 12/16/2015   CBC EXTENDED Latest Ref Rng & Units 11/18/2020 10/17/2020 10/03/2020  WBC 4.0 - 10.5 K/uL 7.7 - -  RBC 4.22 - 5.81 MIL/uL 3.21(L) - -  HGB 13.0 - 17.0 g/dL 9.1(L) 9.8(L) 9.5(L)  HCT 39.0 - 52.0 % 28.7(L) - -  PLT 150 - 400 K/uL 149(L) - -  NEUTROABS 1.7 - 7.7 K/uL - - -  LYMPHSABS 0.7 - 4.0 K/uL - - -     Body mass index is 34.92 kg/m.  Orders:  No orders of the defined types were placed in this encounter.  No orders of the defined types were placed in this encounter.  Procedures: No procedures performed  Clinical Data: No additional findings.  ROS:  All other systems negative, except as noted in the HPI. Review of Systems  Objective: Vital Signs: Ht 6\' 2"  (1.88 m)   Wt 272 lb (123.4 kg)   BMI 34.92 kg/m   Specialty Comments:  No specialty comments available.  PMFS History: Patient Active Problem List   Diagnosis Date Noted  . Carotid stenosis 06/15/2020  . Dyslipidemia 06/15/2020  . Severe nonproliferative diabetic retinopathy of right eye, with macular edema, associated with type 2 diabetes mellitus ([place]) 01/18/2020  . Severe nonproliferative diabetic retinopathy of left eye, with macular edema, associated with type 2 diabetes mellitus ([place]) 01/04/2020  . Retinal hemorrhage of right eye 01/04/2020  . 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 Pseudoclaudication 11/15/2018  . Essential  hypertension 09/18/2018  . S/P BKA (below knee amputation), right ([place]) 09/18/2018  . Amputation of toe of left foot ([place]) 09/18/2018  . Peripheral artery disease ([place]) 09/18/2018  . S/P carotid endarterectomy 09/18/2018  . OSA on CPAP 09/18/2018  . Chronic back pain 09/18/2018  . Status post reversal of ileostomy 05/21/2018  . Normocytic anemia 02/14/2018  . Intra-abdominal abscess ([place]) 11/04/2017  . Chronic venous hypertension (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 ([place]) 12/11/2016  . Unilateral primary osteoarthritis, left knee 10/11/2016  . History of right below knee amputation ([place]) 07/12/2016  . Diabetic foot infection ([place]) 05/04/2016  . Type 2 diabetes mellitus with insulin therapy ([place]) 12/16/2015  . Amputated toe ([place]) 10/21/2015  . Leg edema, left 09/03/2015  . CAD (dz of distal, mid and proximal RCA with implantation of 3 overlapping drug-eluting stent,) 09/03/2015  . Chronic diastolic heart failure, NYHA class 2 ([place]) 09/03/2015  . Angina pectoris associated with type 2 diabetes mellitus ([place]) 10/28/2014  . Hyperlipidemia 08/25/2014  . Limb pain 03/20/2013  . DJD (degenerative joint disease) 09/25/2012  . Migraine 09/25/2012  . Neuropathy 09/25/2012  . Restless legs syndrome (RLS) 09/25/2012  . Chronic obstructive pulmonary disease, unspecified ([place]) 04/25/2012  . Unknown cause of morbidity or mortality 04/25/2012  . Chronic total occlusion of artery of the extremities ([place]) 04/08/2012  . Onychomycosis 02/01/2012  . Occlusion and stenosis of carotid artery without mention of cerebral infarction 06/12/2011  . GERD (gastroesophageal reflux disease) 05/08/2011  . Barrett's esophagus without dysplasia 05/08/2011  . Former tobacco use 02/01/2011   Past Medical History:  Diagnosis Date  . Carotid artery occlusion 11/10/10   LEFT CAROTID ENDARTERECTOMY  . Chronic kidney disease   . Complication of anesthesia    BP WENT UP AT [facility] "  . COPD  (chronic obstructive pulmonary disease) ([place])    pt denies this dx as of 06/01/20 - no inhaler   . Diabetes mellitus without complication ([place])   . Diverticulitis   . Diverticulosis of colon (without mention of hemorrhage)   . DJD (degenerative joint disease)    knees/hands/feet/back/neck  . Fatty liver   . Full dentures   . GERD (gastroesophageal reflux disease)   . H/O hiatal hernia   . History of blood transfusion    with a past surical procedure per patient 06/01/20  . Hyperlipidemia   . Hypertension   . Neuromuscular disorder ([place])    peripheral neuropathy  . Non-pressure chronic ulcer of other part of left foot limited to breakdown of skin ([place]) 11/12/2016  . Osteomyelitis ([place])    left 5th metatarsal  . PAD (peripheral artery disease) ([facility])    Distal aortogram  June 2012. Atherectomy left popliteal artery July 2012.   . Pseudoclaudication 11/15/2018  . Sleep apnea    pt denies this dx as of 06/01/20  . Slurred speech    AS PER WIFE IN D/C NOTE 11/10/10  . 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 Unstable angina ([place]) 09/16/2018  . Wears glasses     Family History  Problem Relation Age of Onset  . Heart disease Father        Before age 81-  CAD, BPG  . Diabetes Father        Amputation  . Cancer Father        PROSTATE  . Hyperlipidemia Father   . Hypertension Father   . Heart attack Father        Triple BPG  . Varicose Veins Father   . Colon cancer Brother   . Diabetes Brother   . Heart disease Brother 76       A-Fib. Before age 51  . Hyperlipidemia Brother   . Hypertension Brother   . Cancer Sister        Breast  . Hyperlipidemia Sister   . Hypertension Sister   . Hypertension Son   . Arthritis Other        GRANDMOTHER  . Hypertension Other        OTHER FAMILY MEMBERS    Past Surgical History:  Procedure Laterality Date  . AMPUTATION  11/05/2011   Procedure: AMPUTATION RAY;  Surgeon: Wylene Simmer, MD;  Location: [facility];  Service: Orthopedics;  Laterality: Right;   Amputation of Right 4&5th Toes  . AMPUTATION Left 11/26/2012   Procedure: AMPUTATION RAY;  Surgeon: Wylene Simmer, MD;  Location: [facility];  Service: Orthopedics;  Laterality: Left;  fourth ray amputation  . AMPUTATION Right 08/27/2014   Procedure: Transmetatarsal Amputation;  Surgeon: Newt Minion, MD;  Location: [facility];  Service: Orthopedics;  Laterality: Right;  . AMPUTATION Right 01/14/2015   Procedure: AMPUTATION BELOW KNEE;  Surgeon: Newt Minion, MD;  Location: [facility];  Service: Orthopedics;  Laterality: Right;  . AMPUTATION Left 10/21/2015   Procedure: Left Foot 5th Ray Amputation;  Surgeon: Newt Minion, MD;  Location: [facility];  Service: Orthopedics;  Laterality: Left;  . ANTERIOR FUSION CERVICAL SPINE  02/06/06   C4-5, C5-6, C6-7; SURGEON DR. MAX COHEN  . AV FISTULA PLACEMENT Left 06/02/2020   Procedure: ARTERIOVENOUS (AV) FISTULA CREATION LEFT;  Surgeon: Waynetta Sandy, MD;  Location: [facility];  Service: Vascular;  Laterality: Left;  . BACK SURGERY     x 3  . BASCILIC VEIN TRANSPOSITION Left 07/21/2020   Procedure: LEFT UPPER ARM ATERIOVENOUS SUPERFISTULALIZATION;  Surgeon: Waynetta Sandy, MD;  Location: [facility];  Service: Vascular;  Laterality: Left;  . BELOW KNEE LEG AMPUTATION Right   . CARDIAC CATHETERIZATION  10/31/04   2009  . CAROTID ENDARTERECTOMY  11/10/10  . CAROTID ENDARTERECTOMY Left 11/10/2010   Subtotal occlusion of left internal carotid artery with left hemispheric transient ischemic attacks.  . CAROTID STENT    . CARPAL TUNNEL RELEASE Right 10/21/2013   Procedure: RIGHT CARPAL TUNNEL RELEASE;  Surgeon: Wynonia Sours, MD;  Location: [facility];  Service: Orthopedics;  Laterality: Right;  . CHOLECYSTECTOMY    . COLON SURGERY    . COLONOSCOPY    . COLOSTOMY REVERSAL  05/21/2018   ileostomy reversal  . CYSTOSCOPY WITH STENT PLACEMENT Bilateral 01/13/2018   Procedure: CYSTOSCOPY WITH BILATERAL URETERAL CATHETER PLACEMENT;  Surgeon: Louis Meckel,  Viona Gilmore,  MD;  Location: [facility];  Service: Urology;  Laterality: Bilateral;  . ESOPHAGEAL MANOMETRY Bilateral 07/19/2014   Procedure: ESOPHAGEAL MANOMETRY (EM);  Surgeon: Jerene Bears, MD;  Location[facility];  Service: Gastroenterology;  Laterality: Bilateral;  . EYE SURGERY Bilateral 2020   cataract  . FEMORAL ARTERY STENT     x6  . FINGER SURGERY    . FOOT SURGERY  04/25/2016    EXCISION BASE 5TH METATARSAL AND PARTIAL CUBOID LEFT FOOT  . HERNIA REPAIR     LEFT INGUINAL AND UMBILICAL REPAIRS  . HERNIA REPAIR    . I & D EXTREMITY Left 04/25/2016   Procedure: EXCISION BASE 5TH METATARSAL AND PARTIAL CUBOID LEFT FOOT;  Surgeon: Newt Minion, MD;  Location: [facility];  Service: Orthopedics;  Laterality: Left;  . ILEOSTOMY  01/13/2018   Procedure: ILEOSTOMY;  Surgeon: Clovis Riley, MD;  Location[facility];  Service: General;;  . ILEOSTOMY CLOSURE N/A 05/21/2018   Procedure: ILEOSTOMY REVERSAL ERAS PATHWAY;  Surgeon: Clovis Riley, MD;  Location: [facility];  Service: General;  Laterality: N/A;  . IR RADIOLOGIST EVAL & MGMT  11/19/2017  . IR RADIOLOGIST EVAL & MGMT  12/03/2017  . IR RADIOLOGIST EVAL & MGMT  12/18/2017  . JOINT REPLACEMENT Right 2001   Total knee  . LAMINECTOMY     X 3 LUMBAR AND X 2 CERVICAL SPINE OPERATIONS  . LAPAROSCOPIC CHOLECYSTECTOMY W/ CHOLANGIOGRAPHY  11/09/04   SURGEON DR. Luella Cook  . LEFT HEART CATH AND CORONARY ANGIOGRAPHY N/A 09/16/2018   Procedure: LEFT HEART CATH AND CORONARY ANGIOGRAPHY;  Surgeon: Nigel Mormon, MD;  Location: [facility];  Service: Cardiovascular;  Laterality: N/A;  . LEFT HEART CATHETERIZATION WITH CORONARY ANGIOGRAM N/A 10/29/2014   Procedure: LEFT HEART CATHETERIZATION WITH CORONARY ANGIOGRAM;  Surgeon: Laverda Page, MD;  Location: [facility];  Service: Cardiovascular;  Laterality: N/A;  . LIGATION OF COMPETING BRANCHES OF ARTERIOVENOUS FISTULA Left 07/21/2020   Procedure: LIGATION OF COMPETING BRANCHES OF LEFT UPPER ARM  ARTERIOVENOUS FISTULA;  Surgeon: Waynetta Sandy, MD;  Location: [facility];  Service: Vascular;  Laterality: Left;  . LOWER EXTREMITY ANGIOGRAM N/A 03/19/2012   Procedure: LOWER EXTREMITY ANGIOGRAM;  Surgeon: Burnell Blanks, MD;  Location: [facility] LAB;  Service: Cardiovascular;  Laterality: N/A;  . NECK SURGERY    . PARTIAL COLECTOMY N/A 01/13/2018   Procedure: LAPAROSCOPIC ASSISTED   SIGMOID COLECTOMY ILEOSTOMY;  Surgeon: Clovis Riley, MD;  [facility];  Service: General;  Laterality: N/A;  . PENILE PROSTHESIS IMPLANT  08/14/05   INFRAPUBIC INSERTION OF INFLATABLE PENILE PROSTHESIS; SURGEON DR. Amalia Hailey  . PENILE PROSTHESIS IMPLANT    . PERCUTANEOUS CORONARY STENT INTERVENTION (PCI-S) Right 10/29/2014   Procedure: PERCUTANEOUS CORONARY STENT INTERVENTION (PCI-S);  Surgeon: Laverda Page, MD;  Location: [facility] LAB;  Service: Cardiovascular;  Laterality: Right;  . SHOULDER ARTHROSCOPY    . SPINE SURGERY    . TOE AMPUTATION Left   . TONSILLECTOMY    . TOTAL KNEE ARTHROPLASTY  07/2002   RIGHT KNEE ; SURGEON  DR. [name] ALSO HAD ARTHROSCOPIC RIGHT KNEE IN  10/2001  . TOTAL KNEE ARTHROPLASTY    . ULNAR NERVE TRANSPOSITION Right 10/21/2013   Procedure: RIGHT ELBOW  ULNAR NERVE DECOMPRESSION;  Surgeon: Wynonia Sours, MD;  Location: [facility];  Service: Orthopedics;  Laterality: Right;   Social History   Occupational History  . Occupation: TRUCK  DRIVER    Employer: UNEMPLOYED  Tobacco Use  . Smoking status: Former Smoker    Packs/day: 2.00    Years: 35.00    Pack years: 70.00    Types: Cigarettes    Quit date: 10/28/2011    Years since quitting: 9.1  . Smokeless tobacco: Never Used  Vaping Use  . Vaping Use: Some days  . Substances: Nicotine  Substance and Sexual Activity  . Alcohol use: Not Currently    Comment: "not in a long time"  . Drug use: Never  . Sexual activity: Yes    Birth control/protection: Implant    Comment: penile implant

## 2020-12-15 ENCOUNTER — Telehealth: Payer: Self-pay | Admitting: Orthopedic Surgery

## 2020-12-15 NOTE — Telephone Encounter (Signed)
Received call from [place] with [facility] needing clarification on wound care orders. The number to contact patient is [phone]

## 2020-12-16 NOTE — Telephone Encounter (Signed)
See below. Does he need unna too?

## 2020-12-16 NOTE — Telephone Encounter (Signed)
3 layer compression wrap does not need Unna paste.  Dynaflex or equivalent compression wrap.

## 2020-12-16 NOTE — Telephone Encounter (Signed)
I called [name] and advised. She states that this is what they have been doing, however, patient came home from last appt with [facility] as well and he told her that he was supposed to have that.. I explained per Dr. Jess Barters message, patient does not require UNNA.

## 2020-12-26 ENCOUNTER — Telehealth: Payer: Self-pay | Admitting: Orthopedic Surgery

## 2020-12-26 NOTE — Telephone Encounter (Signed)
[name] with [facility] called stating the pt has a scab on his L elbow and they've been keeping an eye on it; [name] would like a CB with any additional orders [name] might have.   [phone]

## 2020-12-28 NOTE — Telephone Encounter (Signed)
Will eval at office visit 01/02/21

## 2021-01-02 ENCOUNTER — Ambulatory Visit (INDEPENDENT_AMBULATORY_CARE_PROVIDER_SITE_OTHER): Payer: Medicare Other | Admitting: Physician Assistant

## 2021-01-02 ENCOUNTER — Encounter: Payer: Self-pay | Admitting: Orthopedic Surgery

## 2021-01-02 DIAGNOSIS — L97221 Non-pressure chronic ulcer of left calf limited to breakdown of skin: Secondary | ICD-10-CM | POA: Diagnosis not present

## 2021-01-02 NOTE — Progress Notes (Signed)
Office Visit Note   Patient: [name]           Date of Birth: 1955-12-04           MRN: [id number] Visit Date: 01/02/2021              Requested by: [name], PA-C [address] [place],  [place] [address] PCP: [name], PA-C  Chief Complaint  Patient presents with  . Left Ankle - Follow-up    Left ankle ulcer   . Left Elbow - Pain      HPI: Patient presents today for follow-up on his left lower extremity lymphedema and ulcers.  He has been getting twice weekly Profore dressings with home health and comes in today.  He is also coming in for follow-up on his elbow.  He fell on this a couple months ago and has developed a painful thickened swelling.  Assessment & Plan: Visit Diagnoses: No diagnosis found.  Plan: Patient was seen by [name] as well we will place a compression sleeve for his olecranon bursitis.  Continue Profore dressing changes follow-up in 4 weeks  Follow-Up Instructions: No follow-ups on file.   Ortho Exam  Patient is alert, oriented, no adenopathy, well-dressed, normal affect, normal respiratory effort. Left elbow he has an olecranon bursitis.  No ascending or descending cellulitis.  No evidence of infection.  He has a small skin tear on the forearm of his left arm.  Left lower extremity significant improvement in malleoli or ulcers.  Just very healthy final stages of healing.  No erythema no ascending cellulitis no signs of infection swelling has significantly decreased in his leg  Imaging: No results found. No images are attached to the encounter.  Labs: Lab Results  Component Value Date   HGBA1C 5.7 (H) 09/16/2018   HGBA1C 7.7 (H) 05/15/2018   HGBA1C 7.1 (H) 01/07/2018   ESRSEDRATE 35 (H) 09/03/2016   ESRSEDRATE 111 (H) 05/11/2016   ESRSEDRATE 98 (H) 05/04/2016   CRP 1.2 (H) 09/03/2016   CRP 4.7 (H) 05/11/2016   CRP 8.0 (H) 05/04/2016   REPTSTATUS 11/15/2017 FINAL 11/10/2017   GRAMSTAIN  11/04/2017    ABUNDANT WBC PRESENT,  PREDOMINANTLY PMN MODERATE GRAM NEGATIVE RODS MODERATE GRAM POSITIVE COCCI Performed at [facility], [place] [address] [place], [place] [address]    CULT  11/10/2017    NO GROWTH 5 DAYS Performed at [facility] [address], [place], [place] [address]    LABORGA STAPHYLOCOCCUS AUREUS 09/26/2014     Lab Results  Component Value Date   ALBUMIN 3.7 (L) 05/30/2020   ALBUMIN 2.9 (L) 06/19/2019   ALBUMIN 3.5 05/15/2018   PREALBUMIN 19.0 05/04/2016   PREALBUMIN 25.0 12/16/2015    Lab Results  Component Value Date   MG 1.9 05/24/2018   MG 2.1 05/23/2018   MG 1.3 (L) 05/22/2018   No results found for: VD25OH  Lab Results  Component Value Date   PREALBUMIN 19.0 05/04/2016   PREALBUMIN 25.0 12/16/2015   CBC EXTENDED Latest Ref Rng & Units 11/18/2020 10/17/2020 10/03/2020  WBC 4.0 - 10.5 K/uL 7.7 - -  RBC 4.22 - 5.81 MIL/uL 3.21(L) - -  HGB 13.0 - 17.0 g/dL 9.1(L) 9.8(L) 9.5(L)  HCT 39.0 - 52.0 % 28.7(L) - -  PLT 150 - 400 K/uL 149(L) - -  NEUTROABS 1.7 - 7.7 K/uL - - -  LYMPHSABS 0.7 - 4.0 K/uL - - -     There  is no height or weight on file to calculate BMI.  Orders:  No orders of the defined types were placed in this encounter.  No orders of the defined types were placed in this encounter.    Procedures: No procedures performed  Clinical Data: No additional findings.  ROS:  All other systems negative, except as noted in the HPI. Review of Systems  Objective: Vital Signs: There were no vitals taken for this visit.  Specialty Comments:  No specialty comments available.  PMFS History: Patient Active Problem List   Diagnosis Date Noted  . Carotid stenosis 06/15/2020  . Dyslipidemia 06/15/2020  . Severe nonproliferative diabetic retinopathy of right eye, with macular edema, associated with type 2 diabetes mellitus ([place]) 01/18/2020  . Severe nonproliferative diabetic retinopathy of left eye, with macular edema, associated with type 2 diabetes  mellitus ([place]) 01/04/2020  . Retinal hemorrhage of right eye 01/04/2020  . 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 Pseudoclaudication 11/15/2018  . Essential hypertension 09/18/2018  . S/P BKA (below knee amputation), right ([place]) 09/18/2018  . Amputation of toe of left foot ([place]) 09/18/2018  . Peripheral artery disease ([place]) 09/18/2018  . S/P carotid endarterectomy 09/18/2018  . OSA on CPAP 09/18/2018  . Chronic back pain 09/18/2018  . Status post reversal of ileostomy 05/21/2018  . Normocytic anemia 02/14/2018  . Intra-abdominal abscess ([place]) 11/04/2017  . Chronic venous hypertension (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 ([place]) 12/11/2016  . Unilateral primary osteoarthritis, left knee 10/11/2016  . History of right below knee amputation ([place]) 07/12/2016  . Diabetic foot infection ([place]) 05/04/2016  . Type 2 diabetes mellitus with insulin therapy ([place]) 12/16/2015  . Amputated toe ([place]) 10/21/2015  . Leg edema, left 09/03/2015  . CAD (dz of distal, mid and proximal RCA with implantation of 3 overlapping drug-eluting stent,) 09/03/2015  . Chronic diastolic heart failure, NYHA class 2 ([place]) 09/03/2015  . Angina pectoris associated with type 2 diabetes mellitus ([place]) 10/28/2014  . Hyperlipidemia 08/25/2014  . Limb pain 03/20/2013  . DJD (degenerative joint disease) 09/25/2012  . Migraine 09/25/2012  . Neuropathy 09/25/2012  . Restless legs syndrome (RLS) 09/25/2012  . Chronic obstructive pulmonary disease, unspecified ([place]) 04/25/2012  . Unknown cause of morbidity or mortality 04/25/2012  . Chronic total occlusion of artery of the extremities ([place]) 04/08/2012  . Onychomycosis 02/01/2012  . Occlusion and stenosis of carotid artery without mention of cerebral infarction 06/12/2011  . GERD (gastroesophageal reflux disease) 05/08/2011  . Barrett's esophagus without dysplasia 05/08/2011  . Former tobacco use 02/01/2011   Past Medical History:  Diagnosis Date  .  Carotid artery occlusion 11/10/10   LEFT CAROTID ENDARTERECTOMY  . Chronic kidney disease   . Complication of anesthesia    BP WENT UP AT [facility] "  . COPD (chronic obstructive pulmonary disease) ([place])    pt denies this dx as of 06/01/20 - no inhaler   . Diabetes mellitus without complication ([place])   . Diverticulitis   . Diverticulosis of colon (without mention of hemorrhage)   . DJD (degenerative joint disease)    knees/hands/feet/back/neck  . Fatty liver   . Full dentures   . GERD (gastroesophageal reflux disease)   . H/O hiatal hernia   . History of blood transfusion    with a past surical procedure per patient 06/01/20  . Hyperlipidemia   . Hypertension   . Neuromuscular disorder ([place])    peripheral neuropathy  . Non-pressure chronic ulcer of other part of left foot  limited to breakdown of skin ([place]) 11/12/2016  . Osteomyelitis ([place]    left 5th metatarsal  . PAD (peripheral artery disease) ([place])    Distal aortogram June 2012. Atherectomy left popliteal artery July 2012.   . Pseudoclaudication 11/15/2018  . Sleep apnea    pt denies this dx as of 06/01/20  . Slurred speech    AS PER WIFE IN D/C NOTE 11/10/10  . 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 Unstable angina ([place]) 09/16/2018  . Wears glasses     Family History  Problem Relation Age of Onset  . Heart disease Father        Before age 16-  CAD, BPG  . Diabetes Father        Amputation  . Cancer Father        PROSTATE  . Hyperlipidemia Father   . Hypertension Father   . Heart attack Father        Triple BPG  . Varicose Veins Father   . Colon cancer Brother   . Diabetes Brother   . Heart disease Brother 84       A-Fib. Before age 37  . Hyperlipidemia Brother   . Hypertension Brother   . Cancer Sister        Breast  . Hyperlipidemia Sister   . Hypertension Sister   . Hypertension Son   . Arthritis Other        GRANDMOTHER  . Hypertension Other        OTHER FAMILY MEMBERS    Past Surgical History:  Procedure Laterality  Date  . AMPUTATION  11/05/2011   Procedure: AMPUTATION RAY;  Surgeon: Wylene Simmer, MD;  Location: [facility];  Service: Orthopedics;  Laterality: Right;  Amputation of Right 4&5th Toes  . AMPUTATION Left 11/26/2012   Procedure: AMPUTATION RAY;  Surgeon: Wylene Simmer, MD;  Location: [facility];  Service: Orthopedics;  Laterality: Left;  fourth ray amputation  . AMPUTATION Right 08/27/2014   Procedure: Transmetatarsal Amputation;  Surgeon: Newt Minion, MD;  Location: [facility];  Service: Orthopedics;  Laterality: Right;  . AMPUTATION Right 01/14/2015   Procedure: AMPUTATION BELOW KNEE;  Surgeon: Newt Minion, MD;  Location: [facility];  Service: Orthopedics;  Laterality: Right;  . AMPUTATION Left 10/21/2015   Procedure: Left Foot 5th Ray Amputation;  Surgeon: Newt Minion, MD;  Location: [facility];  Service: Orthopedics;  Laterality: Left;  . ANTERIOR FUSION CERVICAL SPINE  02/06/06   C4-5, C5-6, C6-7; SURGEON DR. MAX COHEN  . AV FISTULA PLACEMENT Left 06/02/2020   Procedure: ARTERIOVENOUS (AV) FISTULA CREATION LEFT;  Surgeon: Waynetta Sandy, MD;  Location: [facility];  Service: Vascular;  Laterality: Left;  . BACK SURGERY     x 3  . BASCILIC VEIN TRANSPOSITION Left 07/21/2020   Procedure: LEFT UPPER ARM ATERIOVENOUS SUPERFISTULALIZATION;  Surgeon: Waynetta Sandy, MD;  Location: [facility];  Service: Vascular;  Laterality: Left;  . BELOW KNEE LEG AMPUTATION Right   . CARDIAC CATHETERIZATION  10/31/04   2009  . CAROTID ENDARTERECTOMY  11/10/10  . CAROTID ENDARTERECTOMY Left 11/10/2010   Subtotal occlusion of left internal carotid artery with left hemispheric transient ischemic attacks.  . CAROTID STENT    . CARPAL TUNNEL RELEASE Right 10/21/2013   Procedure: RIGHT CARPAL TUNNEL RELEASE;  Surgeon: Wynonia Sours, MD;  Location: [facility];  Service: Orthopedics;  Laterality: Right;  . CHOLECYSTECTOMY    . COLON SURGERY    . COLONOSCOPY    .  COLOSTOMY REVERSAL  05/21/2018   ileostomy reversal  .  CYSTOSCOPY WITH STENT PLACEMENT Bilateral 01/13/2018   Procedure: CYSTOSCOPY WITH BILATERAL URETERAL CATHETER PLACEMENT;  Surgeon: Ardis Hughs, MD;  Location: [facility];  Service: Urology;  Laterality: Bilateral;  . ESOPHAGEAL MANOMETRY Bilateral 07/19/2014   Procedure: ESOPHAGEAL MANOMETRY (EM);  Surgeon: Jerene Bears, MD;  Location: [facility];  Service: Gastroenterology;  Laterality: Bilateral;  . EYE SURGERY Bilateral 2020   cataract  . FEMORAL ARTERY STENT     x6  . FINGER SURGERY    . FOOT SURGERY  04/25/2016    EXCISION BASE 5TH METATARSAL AND PARTIAL CUBOID LEFT FOOT  . HERNIA REPAIR     LEFT INGUINAL AND UMBILICAL REPAIRS  . HERNIA REPAIR    . I & D EXTREMITY Left 04/25/2016   Procedure: EXCISION BASE 5TH METATARSAL AND PARTIAL CUBOID LEFT FOOT;  Surgeon: Newt Minion, MD;  Location: [facility];  Service: Orthopedics;  Laterality: Left;  . ILEOSTOMY  01/13/2018   Procedure: ILEOSTOMY;  Surgeon: Clovis Riley, MD;  Location: [facility];  Service: General;;  . ILEOSTOMY CLOSURE N/A 05/21/2018   Procedure: ILEOSTOMY REVERSAL ERAS PATHWAY;  Surgeon: Clovis Riley, MD;  Location: [facility];  Service: General;  Laterality: N/A;  . IR RADIOLOGIST EVAL & MGMT  11/19/2017  . IR RADIOLOGIST EVAL & MGMT  12/03/2017  . IR RADIOLOGIST EVAL & MGMT  12/18/2017  . JOINT REPLACEMENT Right 2001   Total knee  . LAMINECTOMY     X 3 LUMBAR AND X 2 CERVICAL SPINE OPERATIONS  . LAPAROSCOPIC CHOLECYSTECTOMY W/ CHOLANGIOGRAPHY  11/09/04   SURGEON DR. Luella Cook  . LEFT HEART CATH AND CORONARY ANGIOGRAPHY N/A 09/16/2018   Procedure: LEFT HEART CATH AND CORONARY ANGIOGRAPHY;  Surgeon: Nigel Mormon, MD;  Location: [facility];  Service: Cardiovascular;  Laterality: N/A;  . LEFT HEART CATHETERIZATION WITH CORONARY ANGIOGRAM N/A 10/29/2014   Procedure: LEFT HEART CATHETERIZATION WITH CORONARY ANGIOGRAM;  Surgeon: Laverda Page, MD;  Location: [facility];  Service: Cardiovascular;  Laterality:  N/A;  . LIGATION OF COMPETING BRANCHES OF ARTERIOVENOUS FISTULA Left 07/21/2020   Procedure: LIGATION OF COMPETING BRANCHES OF LEFT UPPER ARM ARTERIOVENOUS FISTULA;  Surgeon: Waynetta Sandy, MD;  Location: [facility];  Service: Vascular;  Laterality: Left;  . LOWER EXTREMITY ANGIOGRAM N/A 03/19/2012   Procedure: LOWER EXTREMITY ANGIOGRAM;  Surgeon: Burnell Blanks, MD;  Location: [facility];  Service: Cardiovascular;  Laterality: N/A;  . NECK SURGERY    . PARTIAL COLECTOMY N/A 01/13/2018   Procedure: LAPAROSCOPIC ASSISTED   SIGMOID COLECTOMY ILEOSTOMY;  Surgeon: Clovis Riley, MD;  Location[facility];  Service: General;  Laterality: N/A;  . PENILE PROSTHESIS IMPLANT  08/14/05   INFRAPUBIC INSERTION OF INFLATABLE PENILE PROSTHESIS; SURGEON DR. Amalia Hailey  . PENILE PROSTHESIS IMPLANT    . PERCUTANEOUS CORONARY STENT INTERVENTION (PCI-S) Right 10/29/2014   Procedure: PERCUTANEOUS CORONARY STENT INTERVENTION (PCI-S);  Surgeon: Laverda Page, MD;  Location: [facility] CATH LAB;  Service: Cardiovascular;  Laterality: Right;  . SHOULDER ARTHROSCOPY    . SPINE SURGERY    . TOE AMPUTATION Left   . TONSILLECTOMY    . TOTAL KNEE ARTHROPLASTY  07/2002   RIGHT KNEE ; SURGEON  DR. [name] ALSO HAD ARTHROSCOPIC RIGHT KNEE IN  10/2001  . TOTAL KNEE ARTHROPLASTY    . ULNAR NERVE TRANSPOSITION Right 10/21/2013   Procedure: RIGHT ELBOW  ULNAR NERVE DECOMPRESSION;  Surgeon:  Wynonia Sours, MD;  Location: [facility];  Service: Orthopedics;  Laterality: Right;   Social History   Occupational History  . Occupation: Magazine features editor: UNEMPLOYED  Tobacco Use  . Smoking status: Former Smoker    Packs/day: 2.00    Years: 35.00    Pack years: 70.00    Types: Cigarettes    Quit date: 10/28/2011    Years since quitting: 9.1  . Smokeless tobacco: Never Used  Vaping Use  . Vaping Use: Some days  . Substances: Nicotine  Substance and Sexual Activity  . Alcohol use: Not Currently     Comment: "not in a long time"  . Drug use: Never  . Sexual activity: Yes    Birth control/protection: Implant    Comment: penile implant

## 2021-01-16 ENCOUNTER — Ambulatory Visit (INDEPENDENT_AMBULATORY_CARE_PROVIDER_SITE_OTHER): Payer: 59 | Admitting: Ophthalmology

## 2021-01-16 ENCOUNTER — Other Ambulatory Visit: Payer: Self-pay

## 2021-01-16 ENCOUNTER — Encounter (INDEPENDENT_AMBULATORY_CARE_PROVIDER_SITE_OTHER): Payer: Self-pay | Admitting: Ophthalmology

## 2021-01-16 DIAGNOSIS — E113411 Type 2 diabetes mellitus with severe nonproliferative diabetic retinopathy with macular edema, right eye: Secondary | ICD-10-CM | POA: Diagnosis not present

## 2021-01-16 DIAGNOSIS — E113412 Type 2 diabetes mellitus with severe nonproliferative diabetic retinopathy with macular edema, left eye: Secondary | ICD-10-CM | POA: Diagnosis not present

## 2021-01-16 MED ORDER — AFLIBERCEPT 2MG/0.05ML IZ SOLN FOR KALEIDOSCOPE
2.0000 mg | INTRAVITREAL | Status: AC | PRN
  Administered 2021-01-16: 2 mg via INTRAVITREAL

## 2021-01-16 NOTE — Assessment & Plan Note (Signed)
Vastly improved CSME 6 weeks post Eylea, but 5 weeks post onset of use of renal dialysis.  Thus the "renal retinopathy" affect of uremia on macular perfusion has been mitigated by renal dialysis.  I explained to the patient that his decreased perception of color vision right eye might improve generally if we can maintain his current macular status without CSME as seen today on OCT and clinically

## 2021-01-16 NOTE — Assessment & Plan Note (Signed)
Remains on under control, has no recurrence of CSME some 7 7 months after last injection antivegF

## 2021-01-16 NOTE — Progress Notes (Signed)
01/16/2021     CHIEF COMPLAINT Patient presents for Retina Follow Up (6 Wk F/U OD, poss Eylea OD//Pt denies noticeable changes to [place] OU since last visit. Pt denies ocular pain, flashes of light, or floaters OU. Pt has now been on dialysis x 5 weeks.//LBS: 137 this AM)   HISTORY OF PRESENT ILLNESS: [name] is a 65 y.o. male who presents to the clinic today for:   HPI    Retina Follow Up    Diagnosis: Diabetic Retinopathy   Laterality: right eye   Onset: 6 weeks ago   Severity: moderate   Duration: 6 weeks   Course: stable   Comments: 6 Wk F/U OD, poss Eylea OD  Pt denies noticeable changes to [place] OU since last visit. Pt denies ocular pain, flashes of light, or floaters OU. Pt has now been on dialysis x 5 weeks.  LBS: 137 this AM       Last edited by Rockie Neighbours, [place] on 01/16/2021  3:21 PM. (History)      Referring physician: [name], PA-C [place],  [place] [address]  HISTORICAL INFORMATION:   Selected notes from the MEDICAL RECORD NUMBER    Lab Results  Component Value Date   HGBA1C 5.7 (H) 09/16/2018     CURRENT MEDICATIONS: Current Outpatient Medications (Ophthalmic Drugs)  Medication Sig  . Polyvinyl Alcohol-Povidone PF 1.4-0.6 % SOLN Place 1 drop into both eyes 2 (two) times daily as needed (dry eyes).    No current facility-administered medications for this visit. (Ophthalmic Drugs)   Current Outpatient Medications (Other)  Medication Sig  . amLODipine (NORVASC) 10 MG tablet Take 1 tablet (10 mg total) by mouth daily.  Marland Kitchen aspirin EC 81 MG tablet Take 81 mg by mouth daily.  Marland Kitchen atorvastatin (LIPITOR) 40 MG tablet Take 40 mg by mouth at bedtime.   Marland Kitchen BIDIL 20-37.5 MG tablet Take 1 tablet by mouth 3 (three) times daily.  . carvedilol (COREG) 25 MG tablet Take 1.5 tablets (37.5 mg total) by mouth 2 (two) times daily.  . clobetasol (TEMOVATE) 0.05 % external solution Apply 1 application topically daily as needed (dry scalp).  .  clopidogrel (PLAVIX) 75 MG tablet Take 75 mg by mouth daily.   . fentaNYL (DURAGESIC - DOSED MCG/HR) 50 MCG/HR Place 50 mcg onto the skin every 3 (three) days.  . ferrous gluconate (FERGON) 324 MG tablet Take 324 mg by mouth 2 (two) times daily.  . fluticasone (CUTIVATE) 0.05 % cream Apply 1 application topically 2 (two) times daily as needed (dry skin on face).   . folic acid (FOLVITE) 1 MG tablet Take 1 mg by mouth daily.  . Glucosamine HCl (GLUCOSAMINE PO) Take 100 tablets by mouth 2 (two) times daily.  Marland Kitchen HUMALOG KWIKPEN 100 UNIT/ML KiwkPen Inject 15-16 Units into the skin See admin instructions. Inject 13 units before breakfast, lunch, & dinner--  . insulin glargine (LANTUS) 100 UNIT/ML injection Inject 55 Units into the skin at bedtime.  Marland Kitchen ketoconazole (NIZORAL) 2 % cream Apply 1 application topically 2 (two) times daily as needed for irritation.   Marland Kitchen linaclotide (LINZESS) 145 MCG CAPS capsule Take 145 mcg by mouth daily as needed (constipation).   Marland Kitchen loratadine (CLARITIN) 10 MG tablet Take 10 mg by mouth daily.  . Multiple Vitamin (MULTIVITAMIN) tablet Take 1 tablet by mouth daily.  Marland Kitchen NEEDLE, REUSABLE, 22 G 22G X 1-1/2" MISC 1 Units by Does not apply route as directed. For B12 IM  inj  . nitroGLYCERIN (NITROSTAT) 0.4 MG SL tablet Place 0.4 mg under the tongue every 5 (five) minutes as needed for chest pain.  Marland Kitchen omega-3 acid ethyl esters (LOVAZA) 1 g capsule Take 2 g by mouth 2 (two) times daily.  . ondansetron (ZOFRAN) 4 MG tablet Take 4 mg by mouth every 6 (six) hours as needed for nausea or vomiting.  Marland Kitchen oxyCODONE-acetaminophen (PERCOCET) 7.5-325 MG tablet Take 1 tablet by mouth 3 (three) times daily as needed.  . pantoprazole (PROTONIX) 40 MG tablet Take 40 mg by mouth daily.  . pregabalin (LYRICA) 100 MG capsule Take 1 capsule (100 mg total) by mouth 3 (three) times daily.  Marland Kitchen rOPINIRole (REQUIP) 2 MG tablet Take 2 mg by mouth at bedtime.  . Syringe/Needle, Disp, (SYRINGE 3CC/22GX1-1/2") 22G  X 1-1/2" 3 ML MISC 1 Syringe by Does not apply route as directed. For b12 IM inj  . tamsulosin (FLOMAX) 0.4 MG CAPS capsule Take 0.4 mg by mouth daily.  Marland Kitchen torsemide (DEMADEX) 20 MG tablet Take 20 mg by mouth in the morning and at bedtime. Take on extra for swelling   No current facility-administered medications for this visit. (Other)      REVIEW OF SYSTEMS:    ALLERGIES Allergies  Allergen Reactions  . Contrast Media [Iodinated Diagnostic Agents]     Difficulty breathing    . Ivp Dye [Iodinated Diagnostic Agents] Anaphylaxis and Other (See Comments)    Breathing problems, altered mental state   . Adhesive [Tape] Rash    Rash after 3-4 days use    PAST MEDICAL HISTORY Past Medical History:  Diagnosis Date  . Carotid artery occlusion 11/10/10   LEFT CAROTID ENDARTERECTOMY  . Chronic kidney disease   . Complication of anesthesia    BP WENT UP AT [facility] "  . COPD (chronic obstructive pulmonary disease) ([place])    pt denies this dx as of 06/01/20 - no inhaler   . Diabetes mellitus without complication ([place])   . Diverticulitis   . Diverticulosis of colon (without mention of hemorrhage)   . DJD (degenerative joint disease)    knees/hands/feet/back/neck  . Fatty liver   . Full dentures   . GERD (gastroesophageal reflux disease)   . H/O hiatal hernia   . History of blood transfusion    with a past surical procedure per patient 06/01/20  . Hyperlipidemia   . Hypertension   . Neuromuscular disorder ([place])    peripheral neuropathy  . Non-pressure chronic ulcer of other part of left foot limited to breakdown of skin ([place]) 11/12/2016  . Osteomyelitis ([place])    left 5th metatarsal  . PAD (peripheral artery disease) ([place])    Distal aortogram June 2012. Atherectomy left popliteal artery July 2012.   . Pseudoclaudication 11/15/2018  . Sleep apnea    pt denies this dx as of 06/01/20  . Slurred speech    AS PER WIFE IN D/C NOTE 11/10/10  . 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 Unstable angina  ([place]) 09/16/2018  . Wears glasses    Past Surgical History:  Procedure Laterality Date  . AMPUTATION  11/05/2011   Procedure: AMPUTATION RAY;  Surgeon: Wylene Simmer, MD;  Location: [place];  Service: Orthopedics;  Laterality: Right;  Amputation of Right 4&5th Toes  . AMPUTATION Left 11/26/2012   Procedure: AMPUTATION RAY;  Surgeon: Wylene Simmer, MD;  Location: [facility];  Service: Orthopedics;  Laterality: Left;  fourth ray amputation  . AMPUTATION Right 08/27/2014   Procedure: Transmetatarsal Amputation;  Surgeon: Newt Minion, MD;  Location: [facility];  Service: Orthopedics;  Laterality: Right;  . AMPUTATION Right 01/14/2015   Procedure: AMPUTATION BELOW KNEE;  Surgeon: Newt Minion, MD;  Location: [facility];  Service: Orthopedics;  Laterality: Right;  . AMPUTATION Left 10/21/2015   Procedure: Left Foot 5th Ray Amputation;  Surgeon: Newt Minion, MD;  Location: [facility];  Service: Orthopedics;  Laterality: Left;  . ANTERIOR FUSION CERVICAL SPINE  02/06/06   C4-5, C5-6, C6-7; SURGEON DR. MAX COHEN  . AV FISTULA PLACEMENT Left 06/02/2020   Procedure: ARTERIOVENOUS (AV) FISTULA CREATION LEFT;  Surgeon: Waynetta Sandy, MD;  Location: [facility];  Service: Vascular;  Laterality: Left;  . BACK SURGERY     x 3  . BASCILIC VEIN TRANSPOSITION Left 07/21/2020   Procedure: LEFT UPPER ARM ATERIOVENOUS SUPERFISTULALIZATION;  Surgeon: Waynetta Sandy, MD;  Location: [facility];  Service: Vascular;  Laterality: Left;  . BELOW KNEE LEG AMPUTATION Right   . CARDIAC CATHETERIZATION  10/31/04   2009  . CAROTID ENDARTERECTOMY  11/10/10  . CAROTID ENDARTERECTOMY Left 11/10/2010   Subtotal occlusion of left internal carotid artery with left hemispheric transient ischemic attacks.  . CAROTID STENT    . CARPAL TUNNEL RELEASE Right 10/21/2013   Procedure: RIGHT CARPAL TUNNEL RELEASE;  Surgeon: Wynonia Sours, MD;  Location: [facility];  Service: Orthopedics;  Laterality: Right;  . CHOLECYSTECTOMY    . COLON  SURGERY    . COLONOSCOPY    . COLOSTOMY REVERSAL  05/21/2018   ileostomy reversal  . CYSTOSCOPY WITH STENT PLACEMENT Bilateral 01/13/2018   Procedure: CYSTOSCOPY WITH BILATERAL URETERAL CATHETER PLACEMENT;  Surgeon: Ardis Hughs, MD;  Location: [facility];  Service: Urology;  Laterality: Bilateral;  . ESOPHAGEAL MANOMETRY Bilateral 07/19/2014   Procedure: ESOPHAGEAL MANOMETRY (EM);  Surgeon: Jerene Bears, MD;  [facility];  Service: Gastroenterology;  Laterality: Bilateral;  . EYE SURGERY Bilateral 2020   cataract  . FEMORAL ARTERY STENT     x6  . FINGER SURGERY    . FOOT SURGERY  04/25/2016    EXCISION BASE 5TH METATARSAL AND PARTIAL CUBOID LEFT FOOT  . HERNIA REPAIR     LEFT INGUINAL AND UMBILICAL REPAIRS  . HERNIA REPAIR    . I & D EXTREMITY Left 04/25/2016   Procedure: EXCISION BASE 5TH METATARSAL AND PARTIAL CUBOID LEFT FOOT;  Surgeon: Newt Minion, MD;  Location: [facility];  Service: Orthopedics;  Laterality: Left;  . ILEOSTOMY  01/13/2018   Procedure: ILEOSTOMY;  Surgeon: Clovis Riley, MD;  [facility];  Service: General;;  . ILEOSTOMY CLOSURE N/A 05/21/2018   Procedure: ILEOSTOMY REVERSAL ERAS PATHWAY;  Surgeon: Clovis Riley, MD;  Location: [facility];  Service: General;  Laterality: N/A;  . IR RADIOLOGIST EVAL & MGMT  11/19/2017  . IR RADIOLOGIST EVAL & MGMT  12/03/2017  . IR RADIOLOGIST EVAL & MGMT  12/18/2017  . JOINT REPLACEMENT Right 2001   Total knee  . LAMINECTOMY     X 3 LUMBAR AND X 2 CERVICAL SPINE OPERATIONS  . LAPAROSCOPIC CHOLECYSTECTOMY W/ CHOLANGIOGRAPHY  11/09/04   SURGEON DR. Luella Cook  . LEFT HEART CATH AND CORONARY ANGIOGRAPHY N/A 09/16/2018   Procedure: LEFT HEART CATH AND CORONARY ANGIOGRAPHY;  Surgeon: Nigel Mormon, MD;  Location: [facility];  Service: Cardiovascular;  Laterality: N/A;  . LEFT HEART CATHETERIZATION WITH CORONARY ANGIOGRAM N/A 10/29/2014   Procedure: LEFT HEART CATHETERIZATION  WITH CORONARY ANGIOGRAM;   Surgeon: Laverda Page, MD;  Location: [facility] C[facility];  Service: Cardiovascular;  Laterality: N/A;  . LIGATION OF COMPETING BRANCHES OF ARTERIOVENOUS FISTULA Left 07/21/2020   Procedure: LIGATION OF COMPETING BRANCHES OF LEFT UPPER ARM ARTERIOVENOUS FISTULA;  Surgeon: Waynetta Sandy, MD;  Location: [place];  Service: Vascular;  Laterality: Left;  . LOWER EXTREMITY ANGIOGRAM N/A 03/19/2012   Procedure: LOWER EXTREMITY ANGIOGRAM;  Surgeon: Burnell Blanks, MD;  Location[facility];  Service: Cardiovascular;  Laterality: N/A;  . NECK SURGERY    . PARTIAL COLECTOMY N/A 01/13/2018   Procedure: LAPAROSCOPIC ASSISTED   SIGMOID COLECTOMY ILEOSTOMY;  Surgeon: Clovis Riley, MD;  [facility];  Service: General;  Laterality: N/A;  . PENILE PROSTHESIS IMPLANT  08/14/05   INFRAPUBIC INSERTION OF INFLATABLE PENILE PROSTHESIS; SURGEON DR. Amalia Hailey  . PENILE PROSTHESIS IMPLANT    . PERCUTANEOUS CORONARY STENT INTERVENTION (PCI-S) Right 10/29/2014   Procedure: PERCUTANEOUS CORONARY STENT INTERVENTION (PCI-S);  Surgeon: Laverda Page, MD;  Location: [facility] CATH LAB;  Service: Cardiovascular;  Laterality: Right;  . SHOULDER ARTHROSCOPY    . SPINE SURGERY    . TOE AMPUTATION Left   . TONSILLECTOMY    . TOTAL KNEE ARTHROPLASTY  07/2002   RIGHT KNEE ; SURGEON  DR. [name] ALSO HAD ARTHROSCOPIC RIGHT KNEE IN  10/2001  . TOTAL KNEE ARTHROPLASTY    . ULNAR NERVE TRANSPOSITION Right 10/21/2013   Procedure: RIGHT ELBOW  ULNAR NERVE DECOMPRESSION;  Surgeon: Wynonia Sours, MD;  Location: [facility];  Service: Orthopedics;  Laterality: Right;    FAMILY HISTORY Family History  Problem Relation Age of Onset  . Heart disease Father        Before age 44-  CAD, BPG  . Diabetes Father        Amputation  . Cancer Father        PROSTATE  . Hyperlipidemia Father   . Hypertension Father   . Heart attack Father        Triple BPG  . Varicose Veins Father   . Colon cancer Brother   .  Diabetes Brother   . Heart disease Brother 55       A-Fib. Before age 18  . Hyperlipidemia Brother   . Hypertension Brother   . Cancer Sister        Breast  . Hyperlipidemia Sister   . Hypertension Sister   . Hypertension Son   . Arthritis Other        GRANDMOTHER  . Hypertension Other        OTHER FAMILY MEMBERS    SOCIAL HISTORY Social History   Tobacco Use  . Smoking status: Former Smoker    Packs/day: 2.00    Years: 35.00    Pack years: 70.00    Types: Cigarettes    Quit date: 10/28/2011    Years since quitting: 9.2  . Smokeless tobacco: Never Used  Vaping Use  . Vaping Use: Some days  . Substances: Nicotine  Substance Use Topics  . Alcohol use: Not Currently    Comment: "not in a long time"  . Drug use: Never         OPHTHALMIC EXAM: Base Eye Exam    Visual Acuity (ETDRS)      Right Left   Dist cc 20/100 20/20 -1   Dist ph cc 20/60 +2    Correction: Glasses       Tonometry (Tonopen, 3:27  PM)      Right Left   Pressure 17 20       Pupils      Dark Light Shape React APD   Right 4 3 Round Brisk None   Left 3 2 Round Brisk None       Visual Fields (Counting fingers)      Left Right    Full Full       Extraocular Movement      Right Left    Full Full       Neuro/Psych    Oriented x3: Yes   Mood/Affect: Normal       Dilation    Right eye: 1.0% Mydriacyl, 2.5% Phenylephrine @ 3:27 PM        Slit Lamp and Fundus Exam    External Exam      Right Left   External Normal Normal       Slit Lamp Exam      Right Left   Lids/Lashes Normal Normal   Conjunctiva/Sclera White and quiet White and quiet   Cornea Clear Clear   Anterior Chamber Deep and quiet Deep and quiet   Iris Round and reactive Round and reactive   Lens Posterior chamber intraocular lens Posterior chamber intraocular lens   Anterior Vitreous Normal Normal       Fundus Exam      Right Left   Posterior Vitreous Normal    Disc Normal    C/D Ratio 0.4    Macula  Exudates, Microaneurysms, no apparent CSME    Vessels NPDR severe, with numerous dot blot hemorrhages, VCABS, Irma    Periphery Normal, good PRP 360 yet large dot blot hemorrhages are noted temporally           IMAGING AND PROCEDURES  Imaging and Procedures for 01/16/21  OCT, Retina - OU - Both Eyes       Right Eye Quality was good. Scan locations included subfoveal. Central Foveal Thickness: 273. Progression has improved. Findings include abnormal foveal contour, cystoid macular edema.   Left Eye Quality was good. Scan locations included subfoveal. Central Foveal Thickness: 279. Progression has been stable. Findings include normal foveal contour.   Notes Vastly improved center involved CSME post last injection Eylea at 6 weeks concomitant with the institution of renal dialysis 5 weeks previous.  Thus the effect of renal retinopathy now mitigated with dialysis.       Intravitreal Injection, Pharmacologic Agent - OD - Right Eye       Time Out 01/16/2021. 3:43 PM. Confirmed correct patient, procedure, site, and patient consented.   Anesthesia Topical anesthesia was used. Anesthetic medications included [place] 3.5%.   Procedure Preparation included 5% betadine to ocular surface, 10% betadine to eyelids, Ofloxacin . A 30 gauge needle was used.   Injection:  2 mg aflibercept Alfonse Flavors) SOLN   NDC: [id number], Lot: [id number]   Route: Intravitreal, Site: Right Eye, Waste: 0 mg  Post-op Post injection exam found visual acuity of at least counting fingers. The patient tolerated the procedure well. There were no complications. The patient received written and verbal post procedure care education. Post injection medications were not given.                 ASSESSMENT/PLAN:  Severe nonproliferative diabetic retinopathy of right eye, with macular edema, associated with type 2 diabetes mellitus (HCC) Vastly improved CSME 6 weeks post Eylea, but 5 weeks post onset of use of  renal dialysis.  Thus the "renal  retinopathy" affect of uremia on macular perfusion has been mitigated by renal dialysis.  I explained to the patient that his decreased perception of color vision right eye might improve generally if we can maintain his current macular status without CSME as seen today on OCT and clinically  Severe nonproliferative diabetic retinopathy of left eye, with macular edema, associated with type 2 diabetes mellitus ([place]) Remains on under control, has no recurrence of CSME some 7 7 months after last injection antivegF      ICD-10-CM   1. Severe nonproliferative diabetic retinopathy of right eye, with macular edema, associated with type 2 diabetes mellitus (HCC)  E11.3411 OCT, Retina - OU - Both Eyes    Intravitreal Injection, Pharmacologic Agent - OD - Right Eye    aflibercept (EYLEA) SOLN 2 mg  2. Severe nonproliferative diabetic retinopathy of left eye, with macular edema, associated with type 2 diabetes mellitus ([place])  G92.1194     1.  CSME OD, vastly improved status postinjection Eylea concomitant with successful improvement of uremia with institution of renal dialysis over the last 5 weeks.  The "renal retinopathy" effect of uremia has diminished, will repeat injection antivegF Eylea today and examination again in 6 weeks  2.  OS remains stable  3.  Ophthalmic Meds Ordered this visit:  Meds ordered this encounter  Medications  . aflibercept (EYLEA) SOLN 2 mg       Return in about 6 weeks (around 02/27/2021) for dilate, OCT, EYLEA OCT.  There are no Patient Instructions on file for this visit.   Explained the diagnoses, plan, and follow up with the patient and they expressed understanding.  Patient expressed understanding of the importance of proper follow up care.   Clent Demark Amaira Safley M.D. Diseases & Surgery of the Retina and Vitreous Retina & Diabetic [place] 01/16/21     Abbreviations: M myopia (nearsighted); A astigmatism; H hyperopia  (farsighted); P presbyopia; Mrx spectacle prescription;  CTL contact lenses; OD right eye; OS left eye; OU both eyes  XT exotropia; ET esotropia; PEK punctate epithelial keratitis; PEE punctate epithelial erosions; DES dry eye syndrome; MGD meibomian gland dysfunction; ATs artificial tears; PFAT's preservative free artificial tears; [place] nuclear sclerotic cataract; PSC posterior subcapsular cataract; ERM epi-retinal membrane; PVD posterior vitreous detachment; RD retinal detachment; DM diabetes mellitus; DR diabetic retinopathy; NPDR non-proliferative diabetic retinopathy; PDR proliferative diabetic retinopathy; CSME clinically significant macular edema; DME diabetic macular edema; dbh dot blot hemorrhages; CWS cotton wool spot; POAG primary open angle glaucoma; C/D cup-to-disc ratio; HVF humphrey visual field; GVF goldmann visual field; OCT optical coherence tomography; IOP intraocular pressure; BRVO Branch retinal vein occlusion; CRVO central retinal vein occlusion; CRAO central retinal artery occlusion; BRAO branch retinal artery occlusion; RT retinal tear; SB scleral buckle; PPV pars plana vitrectomy; VH Vitreous hemorrhage; PRP panretinal laser photocoagulation; IVK intravitreal kenalog; VMT vitreomacular traction; MH Macular hole;  NVD neovascularization of the disc; NVE neovascularization elsewhere; AREDS age related eye disease study; ARMD age related macular degeneration; POAG primary open angle glaucoma; EBMD epithelial/anterior basement membrane dystrophy; ACIOL anterior chamber intraocular lens; IOL intraocular lens; PCIOL posterior chamber intraocular lens; Phaco/IOL phacoemulsification with intraocular lens placement; [place] photorefractive keratectomy; LASIK laser assisted in situ keratomileusis; HTN hypertension; DM diabetes mellitus; COPD chronic obstructive pulmonary disease

## 2021-01-20 ENCOUNTER — Telehealth: Payer: Self-pay

## 2021-01-20 NOTE — Telephone Encounter (Signed)
Pt goes to a PCP in [place] and they only has a PA and the PA does not want to manage his fentanyl patch. His wife would like to know if you could manage the patch.

## 2021-01-20 NOTE — Telephone Encounter (Signed)
I do not do narcotics. Probably best to enroll in pain management. It is a process and now very strict. I am truly sorry. I do not have even one with this in our clinic

## 2021-01-23 NOTE — Telephone Encounter (Signed)
Called and spoke to pt regarding med management. Pt voiced understanding and will be looking into pain management.

## 2021-01-30 ENCOUNTER — Encounter: Payer: Self-pay | Admitting: Physician Assistant

## 2021-01-30 ENCOUNTER — Ambulatory Visit (INDEPENDENT_AMBULATORY_CARE_PROVIDER_SITE_OTHER): Payer: 59 | Admitting: Physician Assistant

## 2021-01-30 DIAGNOSIS — L97221 Non-pressure chronic ulcer of left calf limited to breakdown of skin: Secondary | ICD-10-CM | POA: Diagnosis not present

## 2021-01-30 NOTE — Progress Notes (Signed)
Office Visit Note   Patient: [name]           Date of Birth: 1955/12/09           MRN: [id number] Visit Date: 01/30/2021              Requested by: [name], PA-C [address] [place],  [place] [address] PCP: [name], PA-C  Chief Complaint  Patient presents with  . Left Leg - Follow-up      HPI: Patient presents in follow-up today for his left lower extremity.  He has been going compression wraps we have been doing them once a month and home health has been doing it.  He is very pleased with the results is given him and he feels like the wound on his ankle is improving.  Assessment & Plan: Visit Diagnoses: No diagnosis found.  Plan: I do think he would do well with double extra-large sock today.  But he is asked if he could be wrapped just for 2 more weeks.  He will bring the sock I gave him with him at that time  Follow-Up Instructions: No follow-ups on file.   Ortho Exam  Patient is alert, oriented, no adenopathy, well-dressed, normal affect, normal respiratory effort.  Examination significant decrease in swelling of the left lower extremity.  Eschar on the lateral ankle is peeled off to reveal healthy skin.  No other open no other open ulcers no cellulitis no signs of infection  Imaging: No results found. No images are attached to the encounter.  Labs: Lab Results  Component Value Date   HGBA1C 5.7 (H) 09/16/2018   HGBA1C 7.7 (H) 05/15/2018   HGBA1C 7.1 (H) 01/07/2018   ESRSEDRATE 35 (H) 09/03/2016   ESRSEDRATE 111 (H) 05/11/2016   ESRSEDRATE 98 (H) 05/04/2016   CRP 1.2 (H) 09/03/2016   CRP 4.7 (H) 05/11/2016   CRP 8.0 (H) 05/04/2016   REPTSTATUS 11/15/2017 FINAL 11/10/2017   GRAMSTAIN  11/04/2017    ABUNDANT WBC PRESENT, PREDOMINANTLY PMN MODERATE GRAM NEGATIVE RODS MODERATE GRAM POSITIVE COCCI Performed at [facility], [place] [address], [place], [place] [address]    CULT  11/10/2017    NO GROWTH 5 DAYS Performed at [facility] [address] [place],  [address]    LABORGA STAPHYLOCOCCUS AUREUS 09/26/2014     Lab Results  Component Value Date   ALBUMIN 3.7 (L) 05/30/2020   ALBUMIN 2.9 (L) 06/19/2019   ALBUMIN 3.5 05/15/2018   PREALBUMIN 19.0 05/04/2016   PREALBUMIN 25.0 12/16/2015    Lab Results  Component Value Date   MG 1.9 05/24/2018   MG 2.1 05/23/2018   MG 1.3 (L) 05/22/2018   No results found for: VD25OH  Lab Results  Component Value Date   PREALBUMIN 19.0 05/04/2016   PREALBUMIN 25.0 12/16/2015   CBC EXTENDED Latest Ref Rng & Units 11/18/2020 10/17/2020 10/03/2020  WBC 4.0 - 10.5 K/uL 7.7 - -  RBC 4.22 - 5.81 MIL/uL 3.21(L) - -  HGB 13.0 - 17.0 g/dL 9.1(L) 9.8(L) 9.5(L)  HCT 39.0 - 52.0 % 28.7(L) - -  PLT 150 - 400 K/uL 149(L) - -  NEUTROABS 1.7 - 7.7 K/uL - - -  LYMPHSABS 0.7 - 4.0 K/uL - - -     There is no height or weight on file to calculate BMI.  Orders:  No orders of the defined types were placed in this encounter.  No orders of the defined  types were placed in this encounter.    Procedures: No procedures performed  Clinical Data: No additional findings.  ROS:  All other systems negative, except as noted in the HPI. Review of Systems  Objective: Vital Signs: There were no vitals taken for this visit.  Specialty Comments:  No specialty comments available.  PMFS History: Patient Active Problem List   Diagnosis Date Noted  . Carotid stenosis 06/15/2020  . Dyslipidemia 06/15/2020  . Severe nonproliferative diabetic retinopathy of right eye, with macular edema, associated with type 2 diabetes mellitus ([place]) 01/18/2020  . Severe nonproliferative diabetic retinopathy of left eye, with macular edema, associated with type 2 diabetes mellitus ([place]) 01/04/2020  . Retinal hemorrhage of right eye 01/04/2020  . 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 Pseudoclaudication 11/15/2018  . Essential hypertension 09/18/2018  . S/P BKA (below knee amputation), right  ([place]) 09/18/2018  . Amputation of toe of left foot ([place]) 09/18/2018  . Peripheral artery disease ([place]) 09/18/2018  . S/P carotid endarterectomy 09/18/2018  . OSA on CPAP 09/18/2018  . Chronic back pain 09/18/2018  . Status post reversal of ileostomy 05/21/2018  . Normocytic anemia 02/14/2018  . Intra-abdominal abscess ([place]) 11/04/2017  . Chronic venous hypertension (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 ([place]) 12/11/2016  . Unilateral primary osteoarthritis, left knee 10/11/2016  . History of right below knee amputation ([place]) 07/12/2016  . Diabetic foot infection ([place]) 05/04/2016  . Type 2 diabetes mellitus with insulin therapy ([place]) 12/16/2015  . Amputated toe ([place]) 10/21/2015  . Leg edema, left 09/03/2015  . CAD (dz of distal, mid and proximal RCA with implantation of 3 overlapping drug-eluting stent,) 09/03/2015  . Chronic diastolic heart failure, NYHA class 2 ([place]) 09/03/2015  . Angina pectoris associated with type 2 diabetes mellitus ([place]) 10/28/2014  . Hyperlipidemia 08/25/2014  . Limb pain 03/20/2013  . DJD (degenerative joint disease) 09/25/2012  . Migraine 09/25/2012  . Neuropathy 09/25/2012  . Restless legs syndrome (RLS) 09/25/2012  . Chronic obstructive pulmonary disease, unspecified ([place]) 04/25/2012  . Unknown cause of morbidity or mortality 04/25/2012  . Chronic total occlusion of artery of the extremities ([place]) 04/08/2012  . Onychomycosis 02/01/2012  . Occlusion and stenosis of carotid artery without mention of cerebral infarction 06/12/2011  . GERD (gastroesophageal reflux disease) 05/08/2011  . Barrett's esophagus without dysplasia 05/08/2011  . Former tobacco use 02/01/2011   Past Medical History:  Diagnosis Date  . Carotid artery occlusion 11/10/10   LEFT CAROTID ENDARTERECTOMY  . Chronic kidney disease   . Complication of anesthesia    BP WENT UP AT [facility] "  . COPD (chronic obstructive pulmonary disease) ([place])    pt denies this dx as  of 06/01/20 - no inhaler   . Diabetes mellitus without complication ([place])   . Diverticulitis   . Diverticulosis of colon (without mention of hemorrhage)   . DJD (degenerative joint disease)    knees/hands/feet/back/neck  . Fatty liver   . Full dentures   . GERD (gastroesophageal reflux disease)   . H/O hiatal hernia   . History of blood transfusion    with a past surical procedure per patient 06/01/20  . Hyperlipidemia   . Hypertension   . Neuromuscular disorder ([place])    peripheral neuropathy  . Non-pressure chronic ulcer of other part of left foot limited to breakdown of skin ([place]) 11/12/2016  . Osteomyelitis ([place])    left 5th metatarsal  . PAD (peripheral artery disease) ([place])    Distal aortogram June  2012. Atherectomy left popliteal artery July 2012.   . Pseudoclaudication 11/15/2018  . Sleep apnea    pt denies this dx as of 06/01/20  . Slurred speech    AS PER WIFE IN D/C NOTE 11/10/10  . 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 Unstable angina ([place]) 09/16/2018  . Wears glasses     Family History  Problem Relation Age of Onset  . Heart disease Father        Before age 57-  CAD, BPG  . Diabetes Father        Amputation  . Cancer Father        PROSTATE  . Hyperlipidemia Father   . Hypertension Father   . Heart attack Father        Triple BPG  . Varicose Veins Father   . Colon cancer Brother   . Diabetes Brother   . Heart disease Brother 30       A-Fib. Before age 45  . Hyperlipidemia Brother   . Hypertension Brother   . Cancer Sister        Breast  . Hyperlipidemia Sister   . Hypertension Sister   . Hypertension Son   . Arthritis Other        GRANDMOTHER  . Hypertension Other        OTHER FAMILY MEMBERS    Past Surgical History:  Procedure Laterality Date  . AMPUTATION  11/05/2011   Procedure: AMPUTATION RAY;  Surgeon: Wylene Simmer, MD;  [facility];  Service: Orthopedics;  Laterality: Right;  Amputation of Right 4&5th Toes  . AMPUTATION Left 11/26/2012   Procedure:  AMPUTATION RAY;  Surgeon: Wylene Simmer, MD;  Location: [facility];  Service: Orthopedics;  Laterality: Left;  fourth ray amputation  . AMPUTATION Right 08/27/2014   Procedure: Transmetatarsal Amputation;  Surgeon: Newt Minion, MD;  Location: [facility];  Service: Orthopedics;  Laterality: Right;  . AMPUTATION Right 01/14/2015   Procedure: AMPUTATION BELOW KNEE;  Surgeon: Newt Minion, MD;  Location: [facility];  Service: Orthopedics;  Laterality: Right;  . AMPUTATION Left 10/21/2015   Procedure: Left Foot 5th Ray Amputation;  Surgeon: Newt Minion, MD;  Location: [facility];  Service: Orthopedics;  Laterality: Left;  . ANTERIOR FUSION CERVICAL SPINE  02/06/06   C4-5, C5-6, C6-7; SURGEON DR. MAX COHEN  . AV FISTULA PLACEMENT Left 06/02/2020   Procedure: ARTERIOVENOUS (AV) FISTULA CREATION LEFT;  Surgeon: Waynetta Sandy, MD;  Location: [facility];  Service: Vascular;  Laterality: Left;  . BACK SURGERY     x 3  . BASCILIC VEIN TRANSPOSITION Left 07/21/2020   Procedure: LEFT UPPER ARM ATERIOVENOUS SUPERFISTULALIZATION;  Surgeon: Waynetta Sandy, MD;  Location: [facility];  Service: Vascular;  Laterality: Left;  . BELOW KNEE LEG AMPUTATION Right   . CARDIAC CATHETERIZATION  10/31/04   2009  . CAROTID ENDARTERECTOMY  11/10/10  . CAROTID ENDARTERECTOMY Left 11/10/2010   Subtotal occlusion of left internal carotid artery with left hemispheric transient ischemic attacks.  . CAROTID STENT    . CARPAL TUNNEL RELEASE Right 10/21/2013   Procedure: RIGHT CARPAL TUNNEL RELEASE;  Surgeon: Wynonia Sours, MD;  Location: [facility];  Service: Orthopedics;  Laterality: Right;  . CHOLECYSTECTOMY    . COLON SURGERY    . COLONOSCOPY    . COLOSTOMY REVERSAL  05/21/2018   ileostomy reversal  . CYSTOSCOPY WITH STENT PLACEMENT Bilateral 01/13/2018   Procedure: CYSTOSCOPY WITH BILATERAL URETERAL CATHETER PLACEMENT;  Surgeon: Louis Meckel  W, MD;  Location: [facility];  Service: Urology;  Laterality: Bilateral;  .  ESOPHAGEAL MANOMETRY Bilateral 07/19/2014   Procedure: ESOPHAGEAL MANOMETRY (EM);  Surgeon: Jerene Bears, MD;  [facility];  Service: Gastroenterology;  Laterality: Bilateral;  . EYE SURGERY Bilateral 2020   cataract  . FEMORAL ARTERY STENT     x6  . FINGER SURGERY    . FOOT SURGERY  04/25/2016    EXCISION BASE 5TH METATARSAL AND PARTIAL CUBOID LEFT FOOT  . HERNIA REPAIR     LEFT INGUINAL AND UMBILICAL REPAIRS  . HERNIA REPAIR    . I & D EXTREMITY Left 04/25/2016   Procedure: EXCISION BASE 5TH METATARSAL AND PARTIAL CUBOID LEFT FOOT;  Surgeon: Newt Minion, MD;  Location: [facility];  Service: Orthopedics;  Laterality: Left;  . ILEOSTOMY  01/13/2018   Procedure: ILEOSTOMY;  Surgeon: Clovis Riley, MD;  Location[facility];  Service: General;;  . ILEOSTOMY CLOSURE N/A 05/21/2018   Procedure: ILEOSTOMY REVERSAL ERAS PATHWAY;  Surgeon: Clovis Riley, MD;  Location: [facility];  Service: General;  Laterality: N/A;  . IR RADIOLOGIST EVAL & MGMT  11/19/2017  . IR RADIOLOGIST EVAL & MGMT  12/03/2017  . IR RADIOLOGIST EVAL & MGMT  12/18/2017  . JOINT REPLACEMENT Right 2001   Total knee  . LAMINECTOMY     X 3 LUMBAR AND X 2 CERVICAL SPINE OPERATIONS  . LAPAROSCOPIC CHOLECYSTECTOMY W/ CHOLANGIOGRAPHY  11/09/04   SURGEON DR. Luella Cook  . LEFT HEART CATH AND CORONARY ANGIOGRAPHY N/A 09/16/2018   Procedure: LEFT HEART CATH AND CORONARY ANGIOGRAPHY;  Surgeon: Nigel Mormon, MD;  Location: [facility];  Service: Cardiovascular;  Laterality: N/A;  . LEFT HEART CATHETERIZATION WITH CORONARY ANGIOGRAM N/A 10/29/2014   Procedure: LEFT HEART CATHETERIZATION WITH CORONARY ANGIOGRAM;  Surgeon: Laverda Page, MD;  Location: [facility];  Service: Cardiovascular;  Laterality: N/A;  . LIGATION OF COMPETING BRANCHES OF ARTERIOVENOUS FISTULA Left 07/21/2020   Procedure: LIGATION OF COMPETING BRANCHES OF LEFT UPPER ARM ARTERIOVENOUS FISTULA;  Surgeon: Waynetta Sandy, MD;  Location: [facility];  Service: Vascular;  Laterality: Left;  . LOWER EXTREMITY ANGIOGRAM N/A 03/19/2012   Procedure: LOWER EXTREMITY ANGIOGRAM;  Surgeon: Burnell Blanks, MD;  Location: [facility];  Service: Cardiovascular;  Laterality: N/A;  . NECK SURGERY    . PARTIAL COLECTOMY N/A 01/13/2018   Procedure: LAPAROSCOPIC ASSISTED   SIGMOID COLECTOMY ILEOSTOMY;  Surgeon: Clovis Riley, MD;  [facility];  Service: General;  Laterality: N/A;  . PENILE PROSTHESIS IMPLANT  08/14/05   INFRAPUBIC INSERTION OF INFLATABLE PENILE PROSTHESIS; SURGEON DR. Amalia Hailey  . PENILE PROSTHESIS IMPLANT    . PERCUTANEOUS CORONARY STENT INTERVENTION (PCI-S) Right 10/29/2014   Procedure: PERCUTANEOUS CORONARY STENT INTERVENTION (PCI-S);  Surgeon: Laverda Page, MD;  Location: [facility];  Service: Cardiovascular;  Laterality: Right;  . SHOULDER ARTHROSCOPY    . SPINE SURGERY    . TOE AMPUTATION Left   . TONSILLECTOMY    . TOTAL KNEE ARTHROPLASTY  07/2002   RIGHT KNEE ; SURGEON  DR. [name] ALSO HAD ARTHROSCOPIC RIGHT KNEE IN  10/2001  . TOTAL KNEE ARTHROPLASTY    . ULNAR NERVE TRANSPOSITION Right 10/21/2013   Procedure: RIGHT ELBOW  ULNAR NERVE DECOMPRESSION;  Surgeon: Wynonia Sours, MD;  Location: [facility];  Service: Orthopedics;  Laterality: Right;   Social History   Occupational History  . Occupation: TRUCK DRIVER  Employer: UNEMPLOYED  Tobacco Use  . Smoking status: Former Smoker    Packs/day: 2.00    Years: 35.00    Pack years: 70.00    Types: Cigarettes    Quit date: 10/28/2011    Years since quitting: 9.2  . Smokeless tobacco: Never Used  Vaping Use  . Vaping Use: Some days  . Substances: Nicotine  Substance and Sexual Activity  . Alcohol use: Not Currently    Comment: "not in a long time"  . Drug use: Never  . Sexual activity: Yes    Birth control/protection: Implant    Comment: penile implant

## 2021-02-09 ENCOUNTER — Encounter (HOSPITAL_COMMUNITY): Payer: Self-pay

## 2021-02-13 ENCOUNTER — Other Ambulatory Visit: Payer: Self-pay

## 2021-02-13 ENCOUNTER — Encounter: Payer: Self-pay | Admitting: Orthopedic Surgery

## 2021-02-13 ENCOUNTER — Ambulatory Visit (INDEPENDENT_AMBULATORY_CARE_PROVIDER_SITE_OTHER): Payer: 59 | Admitting: Orthopedic Surgery

## 2021-02-13 DIAGNOSIS — M25522 Pain in left elbow: Secondary | ICD-10-CM | POA: Diagnosis not present

## 2021-02-13 DIAGNOSIS — R531 Weakness: Secondary | ICD-10-CM

## 2021-02-13 DIAGNOSIS — L97221 Non-pressure chronic ulcer of left calf limited to breakdown of skin: Secondary | ICD-10-CM | POA: Diagnosis not present

## 2021-02-13 DIAGNOSIS — Z89511 Acquired absence of right leg below knee: Secondary | ICD-10-CM | POA: Diagnosis not present

## 2021-02-13 DIAGNOSIS — M7022 Olecranon bursitis, left elbow: Secondary | ICD-10-CM | POA: Diagnosis not present

## 2021-02-13 MED ORDER — METHYLPREDNISOLONE ACETATE 40 MG/ML IJ SUSP
40.0000 mg | INTRAMUSCULAR | Status: AC | PRN
  Administered 2021-02-13: 40 mg via INTRA_ARTICULAR

## 2021-02-13 MED ORDER — LIDOCAINE HCL 1 % IJ SOLN
2.0000 mL | INTRAMUSCULAR | Status: AC | PRN
  Administered 2021-02-13: 2 mL

## 2021-02-13 NOTE — Progress Notes (Addendum)
Office Visit Note   Patient: [name]           Date of Birth: Oct 28, 1955           MRN: [id number] Visit Date: 02/13/2021              Requested by: [name], PA-C [address] [place],  [place] [address] PCP: [name], PA-C  Chief Complaint  Patient presents with   Left Leg - Follow-up      HPI: Patient is a 65 year old gentleman who is seen for 2 separate issues #1 he is having difficulty with the prosthesis on the right #2 he is status post venous ulcers on the left but still has weakness on the left lower extremity.  Patient states he has dialysis Tuesday Thursday Saturday and would be able to go to therapy on a Monday.  Patient's most recent ulcer on the left developed from his bracing for the left leg.  Patient also complains of swelling of the olecranon bursa left elbow.  Patient's medical conditions include diabetes hypertension dialysis he is status post a right transtibial amputation with ulcerations on his left leg.  He has a history of COPD coronary artery disease and peripheral arterial disease.  Patient currently attempts to ambulate with a manual wheelchair but has difficulty due to his general body habitus.  Patient is 6 foot 2 253 pounds.  Patient has poor balance secondary to the diabetes has difficulty wearing the prosthesis on the right and lacks the endurance for ambulation with his prosthesis and with using a manual wheelchair.  Patient may quires assistance in the home for his activities of daily living.  Assessment & Plan: Visit Diagnoses:  1. Weak   2. Skin ulcer of calf, left, limited to breakdown of skin ([place])   3. History of right below knee amputation (HCC)   4. Pain in left elbow   5. Olecranon bursitis of left elbow     Plan: Patient was provided a prescription for physical therapy for strengthening the left lower extremity anticipate therapy on Mondays.  He is given a prescription for [employer] for bracing for the left leg and  evaluation for modifications to the right socket free possible new liner new materials new supplies.  Patient is extremely weak after dialysis and will need an electric wheelchair to help with activities of daily living around the home..  Patient is motivated and has the ability to operate a motorized wheelchair..  Follow-Up Instructions: Return if symptoms worsen or fail to improve.   Ortho Exam  Patient is alert, oriented, no adenopathy, well-dressed, normal affect, normal respiratory effort. Examination the venous ulcer on the left leg is completely healed there is no redness no cellulitis the skin wrinkles well recommend that he does resume wearing his compression stockings and changes at least twice a week patient has been wearing them for about a month in the past.  The right prosthesis is having difficulty fitting and we will set him up with [employer] for a new liner materials and supplies.  We will evaluate for a new brace for the left lower extremity.  Examination patient has recurrent olecranon bursal swelling.  After informed consent this was prepped using Betadine paint and 30 cc was aspirated a Ace wrap was applied loosely.  Patient has strength in his right upper and left upper extremity of 2-hour 5.  Patient reports pain in both lower extremities with attempted ambulation approximately 8 out of 10.  Patient  has an antalgic gait.  Patient has good flexion of both hips but has decreased range of motion of both knees but can get both knees to 90 degrees.  Patient has tried a manual wheelchair cane and walker and is unable to perform activities of daily living with his weakness.  Imaging: No results found. No images are attached to the encounter.  Labs: Lab Results  Component Value Date   HGBA1C 5.7 (H) 09/16/2018   HGBA1C 7.7 (H) 05/15/2018   HGBA1C 7.1 (H) 01/07/2018   ESRSEDRATE 35 (H) 09/03/2016   ESRSEDRATE 111 (H) 05/11/2016   ESRSEDRATE 98 (H) 05/04/2016   CRP 1.2 (H)  09/03/2016   CRP 4.7 (H) 05/11/2016   CRP 8.0 (H) 05/04/2016   REPTSTATUS 11/15/2017 FINAL 11/10/2017   GRAMSTAIN  11/04/2017    ABUNDANT WBC PRESENT, PREDOMINANTLY PMN MODERATE GRAM NEGATIVE RODS MODERATE GRAM POSITIVE COCCI Performed at [facility], [place] [address], [place], [place] [address]    CULT  11/10/2017    NO GROWTH 5 DAYS Performed at [facility] [address] [place], [place] [address]    LABORGA STAPHYLOCOCCUS AUREUS 09/26/2014     Lab Results  Component Value Date   ALBUMIN 3.7 (L) 05/30/2020   ALBUMIN 2.9 (L) 06/19/2019   ALBUMIN 3.5 05/15/2018   PREALBUMIN 19.0 05/04/2016   PREALBUMIN 25.0 12/16/2015    Lab Results  Component Value Date   MG 1.9 05/24/2018   MG 2.1 05/23/2018   MG 1.3 (L) 05/22/2018   No results found for: VD25OH  Lab Results  Component Value Date   PREALBUMIN 19.0 05/04/2016   PREALBUMIN 25.0 12/16/2015   CBC EXTENDED Latest Ref Rng & Units 11/18/2020 10/17/2020 10/03/2020  WBC 4.0 - 10.5 K/uL 7.7 - -  RBC 4.22 - 5.81 MIL/uL 3.21(L) - -  HGB 13.0 - 17.0 g/dL 9.1(L) 9.8(L) 9.5(L)  HCT 39.0 - 52.0 % 28.7(L) - -  PLT 150 - 400 K/uL 149(L) - -  NEUTROABS 1.7 - 7.7 K/uL - - -  LYMPHSABS 0.7 - 4.0 K/uL - - -     There is no height or weight on file to calculate BMI.  Orders:  Orders Placed This Encounter  Procedures   Medium Joint Inj: L olecranon bursa   Ambulatory referral to Physical Therapy   Meds ordered this encounter  Medications   lidocaine (XYLOCAINE) 1 % (with pres) injection 2 mL   methylPREDNISolone acetate (DEPO-MEDROL) injection 40 mg     Procedures: Medium Joint Inj: L olecranon bursa on 02/13/2021 5:28 PM Indications: pain and diagnostic evaluation Details: 22 G 1.5 in needle, posterior approach Medications: 2 mL lidocaine 1 %; 40 mg methylPREDNISolone acetate 40 MG/ML Aspirate: 30 mL blood-tinged Outcome: tolerated well, no immediate complications Procedure, treatment alternatives, risks  and benefits explained, specific risks discussed. Consent was given by the patient. Immediately prior to procedure a time out was called to verify the correct patient, procedure, equipment, support staff and site/side marked as required. Patient was prepped and draped in the usual sterile fashion.      Clinical Data: No additional findings.  ROS:  All other systems negative, except as noted in the HPI. Review of Systems  Objective: Vital Signs: There were no vitals taken for this visit.  Specialty Comments:  No specialty comments available.  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   Pseudoclaudication 11/15/2018   Essential hypertension 09/18/2018   S/P BKA (below knee amputation), right ([place]) 09/18/2018   Amputation of toe of left foot ([place]) 09/18/2018   Peripheral artery disease ([place]) 09/18/2018   S/P carotid endarterectomy 09/18/2018   OSA on CPAP 09/18/2018   Chronic back pain 09/18/2018   Status post reversal of ileostomy 05/21/2018   Normocytic anemia 02/14/2018   Intra-abdominal abscess ([place]) 11/04/2017   Chronic venous hypertension (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 ([place]) 12/11/2016   Unilateral primary osteoarthritis, left knee 10/11/2016   History of right below knee amputation ([place]) 07/12/2016   Diabetic foot infection ([place]) 05/04/2016   Type 2 diabetes mellitus with insulin therapy ([place]) 12/16/2015   Amputated toe ([place]) 10/21/2015   Leg edema, left 09/03/2015   CAD (dz of distal, mid and proximal RCA  with implantation of 3 overlapping drug-eluting stent,) 09/03/2015   Chronic diastolic heart failure, NYHA class 2 ([place]) 09/03/2015   Angina pectoris associated with type 2 diabetes mellitus ([place]) 10/28/2014   Hyperlipidemia 08/25/2014   Limb pain 03/20/2013   DJD (degenerative joint disease) 09/25/2012   Migraine 09/25/2012   Neuropathy 09/25/2012   Restless legs syndrome (RLS) 09/25/2012   Chronic obstructive pulmonary disease, unspecified ([place]) 04/25/2012   Unknown cause of morbidity or mortality 04/25/2012   Chronic total occlusion of artery of the extremities ([place]) 04/08/2012   Onychomycosis 02/01/2012   Occlusion and stenosis of carotid artery without mention of cerebral infarction 06/12/2011   GERD (gastroesophageal reflux disease) 05/08/2011   Barrett's esophagus without dysplasia 05/08/2011   Former tobacco use 02/01/2011   Past Medical History:  Diagnosis Date   Carotid artery occlusion 11/10/10   LEFT CAROTID ENDARTERECTOMY   Chronic kidney disease    Complication of anesthesia    BP WENT UP AT [facility] "   COPD (chronic obstructive pulmonary disease) ([place])    pt denies this dx as of 06/01/20 - no inhaler    Diabetes mellitus without complication ([place])    Diverticulitis    Diverticulosis of colon (without mention of hemorrhage)    DJD (degenerative joint disease)    knees/hands/feet/back/neck   Fatty liver    Full dentures    GERD (gastroesophageal reflux disease)    H/O hiatal hernia    History of blood transfusion    with a past surical procedure per patient 06/01/20   Hyperlipidemia    Hypertension    Neuromuscular disorder ([place])    peripheral neuropathy   Non-pressure chronic ulcer of other part of left foot limited to breakdown of skin ([place]) 11/12/2016   Osteomyelitis (HCC)    left 5th metatarsal   PAD (peripheral artery disease) ([place])    Distal aortogram June 2012. Atherectomy left popliteal artery July 2012.    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 Before age 80   Hyperlipidemia Brother    Hypertension Brother    Cancer Sister        Breast   Hyperlipidemia Sister    Hypertension Sister    Hypertension Son    Arthritis Other        GRANDMOTHER   Hypertension Other        OTHER FAMILY MEMBERS    Past Surgical History:  Procedure Laterality Date   AMPUTATION  11/05/2011   Procedure: AMPUTATION RAY;  Surgeon: Wylene Simmer, MD;  Location: [facility];  Service: Orthopedics;  Laterality: Right;  Amputation of Right 4&5th Toes   AMPUTATION Left 11/26/2012   Procedure: AMPUTATION RAY;  Surgeon: Wylene Simmer, MD;  Location: [facility];  Service: Orthopedics;  Laterality: Left;  fourth ray amputation   AMPUTATION Right 08/27/2014   Procedure: Transmetatarsal Amputation;  Surgeon: Newt Minion, MD;  Location: [facility];  Service: Orthopedics;  Laterality: Right;   AMPUTATION Right 01/14/2015   Procedure: AMPUTATION BELOW KNEE;  Surgeon: Newt Minion, MD;  Location: [facility];  Service: Orthopedics;  Laterality: Right;   AMPUTATION Left 10/21/2015   Procedure: Left Foot 5th Ray Amputation;  Surgeon: Newt Minion, MD;  Location: [facility];  Service: Orthopedics;  Laterality: Left;   ANTERIOR FUSION CERVICAL SPINE  02/06/06   C4-5, C5-6, C6-7; SURGEON DR. MAX COHEN   AV FISTULA PLACEMENT Left 06/02/2020   Procedure: ARTERIOVENOUS (AV) FISTULA CREATION LEFT;  Surgeon: Waynetta Sandy, MD;  Location: [facility];  Service:  Vascular;  Laterality: Left;   BACK SURGERY     x 3   [place] Left 07/21/2020   Procedure: LEFT UPPER ARM ATERIOVENOUS SUPERFISTULALIZATION;  Surgeon: Waynetta Sandy, MD;  Location: [facility];  Service: Vascular;  Laterality: Left;   BELOW KNEE LEG AMPUTATION Right    CARDIAC CATHETERIZATION  10/31/04   2009   CAROTID ENDARTERECTOMY  11/10/10   CAROTID ENDARTERECTOMY Left 11/10/2010   Subtotal occlusion of left internal carotid artery with left hemispheric transient ischemic attacks.   CAROTID STENT     CARPAL TUNNEL RELEASE Right 10/21/2013   Procedure: RIGHT CARPAL TUNNEL RELEASE;  Surgeon: Wynonia Sours, MD;  Location: [facility];  Service: Orthopedics;  Laterality: Right;   CHOLECYSTECTOMY     COLON SURGERY     COLONOSCOPY     COLOSTOMY REVERSAL  05/21/2018   ileostomy reversal   CYSTOSCOPY WITH STENT PLACEMENT Bilateral 01/13/2018   Procedure: CYSTOSCOPY WITH BILATERAL URETERAL CATHETER PLACEMENT;  Surgeon: Ardis Hughs, MD;  Location: [facility];  Service: Urology;  Laterality: Bilateral;   ESOPHAGEAL MANOMETRY Bilateral 07/19/2014   Procedure: ESOPHAGEAL MANOMETRY (EM);  Surgeon: Jerene Bears, MD;  [facility];  Service: Gastroenterology;  Laterality: Bilateral;   EYE SURGERY Bilateral 2020   cataract   FEMORAL ARTERY STENT     x6   FINGER SURGERY     FOOT SURGERY  04/25/2016    EXCISION BASE 5TH METATARSAL AND PARTIAL CUBOID LEFT FOOT  HERNIA REPAIR     LEFT INGUINAL AND UMBILICAL REPAIRS   HERNIA REPAIR     I & D EXTREMITY Left 04/25/2016   Procedure: EXCISION BASE 5TH METATARSAL AND PARTIAL CUBOID LEFT FOOT;  Surgeon: Newt Minion, MD;  Location: [facility];  Service: Orthopedics;  Laterality: Left;   ILEOSTOMY  01/13/2018   Procedure: ILEOSTOMY;  Surgeon: Clovis Riley, MD;  Location[facility];  Service: General;;   ILEOSTOMY CLOSURE N/A 05/21/2018   Procedure: ILEOSTOMY REVERSAL ERAS PATHWAY;  Surgeon: Clovis Riley, MD;   Location: [facility];  Service: General;  Laterality: N/A;   IR RADIOLOGIST EVAL & MGMT  11/19/2017   IR RADIOLOGIST EVAL & MGMT  12/03/2017   IR RADIOLOGIST EVAL & MGMT  12/18/2017   JOINT REPLACEMENT Right 2001   Total knee   LAMINECTOMY     X 3 LUMBAR AND X 2 CERVICAL SPINE OPERATIONS   LAPAROSCOPIC CHOLECYSTECTOMY W/ CHOLANGIOGRAPHY  11/09/04   SURGEON DR. [place] CATH AND CORONARY ANGIOGRAPHY N/A 09/16/2018   Procedure: LEFT HEART CATH AND CORONARY ANGIOGRAPHY;  Surgeon: Nigel Mormon, MD;  Location: [facility];  Service: Cardiovascular;  Laterality: N/A;   LEFT HEART CATHETERIZATION WITH CORONARY ANGIOGRAM N/A 10/29/2014   Procedure: LEFT HEART CATHETERIZATION WITH CORONARY ANGIOGRAM;  Surgeon: Laverda Page, MD;  Location: [facility];  Service: Cardiovascular;  Laterality: N/A;   LIGATION OF COMPETING BRANCHES OF ARTERIOVENOUS FISTULA Left 07/21/2020   Procedure: LIGATION OF COMPETING BRANCHES OF LEFT UPPER ARM ARTERIOVENOUS FISTULA;  Surgeon: Waynetta Sandy, MD;  Location: [facility];  Service: Vascular;  Laterality: Left;   LOWER EXTREMITY ANGIOGRAM N/A 03/19/2012   Procedure: LOWER EXTREMITY ANGIOGRAM;  Surgeon: Burnell Blanks, MD;  Location: [facility] LAB;  Service: Cardiovascular;  Laterality: N/A;   NECK SURGERY     PARTIAL COLECTOMY N/A 01/13/2018   Procedure: LAPAROSCOPIC ASSISTED   SIGMOID COLECTOMY ILEOSTOMY;  Surgeon: Clovis Riley, MD;  [facility];  Service: General;  Laterality: N/A;   PENILE PROSTHESIS IMPLANT  08/14/05   INFRAPUBIC INSERTION OF INFLATABLE PENILE PROSTHESIS; SURGEON DR. Amalia Hailey   PENILE PROSTHESIS IMPLANT     PERCUTANEOUS CORONARY STENT INTERVENTION (PCI-S) Right 10/29/2014   Procedure: PERCUTANEOUS CORONARY STENT INTERVENTION (PCI-S);  Surgeon: Laverda Page, MD;  Location: [facility];  Service: Cardiovascular;  Laterality: Right;   SHOULDER ARTHROSCOPY     SPINE SURGERY     TOE AMPUTATION Left     TONSILLECTOMY     TOTAL KNEE ARTHROPLASTY  07/2002   RIGHT KNEE ; SURGEON  DR. [name] ALSO HAD ARTHROSCOPIC RIGHT KNEE IN  10/2001   TOTAL KNEE ARTHROPLASTY     ULNAR NERVE TRANSPOSITION Right 10/21/2013   Procedure: RIGHT ELBOW  ULNAR NERVE DECOMPRESSION;  Surgeon: Wynonia Sours, MD;  Location: [facility];  Service: Orthopedics;  Laterality: Right;   Social History   Occupational History   Occupation: Magazine features editor: UNEMPLOYED  Tobacco Use   Smoking status: Former Smoker    Packs/day: 2.00    Years: 35.00    Pack years: 70.00    Types: Cigarettes    Quit date: 10/28/2011    Years since quitting: 9.3   Smokeless tobacco: Never Used  Vaping Use   Vaping Use: Some days   Substances: Nicotine  Substance and Sexual Activity   Alcohol use: Not Currently    Comment: "not in a  long time"   Drug use: Never   Sexual activity: Yes    Birth control/protection: Implant    Comment: penile implant

## 2021-02-22 ENCOUNTER — Telehealth: Payer: Self-pay | Admitting: Orthopedic Surgery

## 2021-02-22 NOTE — Telephone Encounter (Signed)
02/13/21 ov note faxed to [facility] [phone]

## 2021-02-27 ENCOUNTER — Encounter (INDEPENDENT_AMBULATORY_CARE_PROVIDER_SITE_OTHER): Payer: 59 | Admitting: Ophthalmology

## 2021-02-27 ENCOUNTER — Ambulatory Visit (INDEPENDENT_AMBULATORY_CARE_PROVIDER_SITE_OTHER): Payer: 59 | Admitting: Physician Assistant

## 2021-02-27 ENCOUNTER — Encounter: Payer: Self-pay | Admitting: Orthopedic Surgery

## 2021-02-27 DIAGNOSIS — I872 Venous insufficiency (chronic) (peripheral): Secondary | ICD-10-CM | POA: Diagnosis not present

## 2021-02-27 NOTE — Progress Notes (Signed)
Office Visit Note   Patient: [name]           Date of Birth: Sep 28, 1955           MRN: [id number] Visit Date: 02/27/2021              Requested by: [name], PA-C [address] [place],  [place] [address] PCP: [name], PA-C  Chief Complaint  Patient presents with   Left Elbow - Follow-up    S/p aspiration 02/13/21      HPI: Patient is a pleasant 65 year old gentleman with a history of left lateral ankle wounds and calf wounds originally from an ill fitting brace.  He was sequentially wrapped with compression wraps for quite a while and most recently transitioned into his medical compression stockings.  He comes in today because the swelling has gotten significantly worse again causing both wounds to reopen.  He denies any fever chills  Assessment & Plan: Visit Diagnoses: No diagnosis found.  Plan: Plan for compression wrapping ideally twice a week.  He says he cannot come in on Friday but will come in again on Monday.  Emphasized the importance of good protein intake which we have told him before.  Follow-Up Instructions: No follow-ups on file.   Ortho Exam  Patient is alert, oriented, no adenopathy, well-dressed, normal affect, normal respiratory effort. Left lower extremity lateral wound does not probe deeply to bone no surrounding cellulitis or foul odor.  He also has skin breakdown proximally.  No surrounding cellulitis no ascending cellulitis moderate plus swelling  Imaging: No results found. No images are attached to the encounter.  Labs: Lab Results  Component Value Date   HGBA1C 5.7 (H) 09/16/2018   HGBA1C 7.7 (H) 05/15/2018   HGBA1C 7.1 (H) 01/07/2018   ESRSEDRATE 35 (H) 09/03/2016   ESRSEDRATE 111 (H) 05/11/2016   ESRSEDRATE 98 (H) 05/04/2016   CRP 1.2 (H) 09/03/2016   CRP 4.7 (H) 05/11/2016   CRP 8.0 (H) 05/04/2016   REPTSTATUS 11/15/2017 FINAL 11/10/2017   GRAMSTAIN  11/04/2017    ABUNDANT WBC PRESENT, PREDOMINANTLY PMN MODERATE GRAM  NEGATIVE RODS MODERATE GRAM POSITIVE COCCI Performed at [facility], [place] [address], [place], [place] [address]    CULT  11/10/2017    NO GROWTH 5 DAYS Performed at [facility] [address], [place], [place] [address]    LABORGA STAPHYLOCOCCUS AUREUS 09/26/2014     Lab Results  Component Value Date   ALBUMIN 3.7 (L) 05/30/2020   ALBUMIN 2.9 (L) 06/19/2019   ALBUMIN 3.5 05/15/2018   PREALBUMIN 19.0 05/04/2016   PREALBUMIN 25.0 12/16/2015    Lab Results  Component Value Date   MG 1.9 05/24/2018   MG 2.1 05/23/2018   MG 1.3 (L) 05/22/2018   No results found for: VD25OH  Lab Results  Component Value Date   PREALBUMIN 19.0 05/04/2016   PREALBUMIN 25.0 12/16/2015   CBC EXTENDED Latest Ref Rng & Units 11/18/2020 10/17/2020 10/03/2020  WBC 4.0 - 10.5 K/uL 7.7 - -  RBC 4.22 - 5.81 MIL/uL 3.21(L) - -  HGB 13.0 - 17.0 g/dL 9.1(L) 9.8(L) 9.5(L)  HCT 39.0 - 52.0 % 28.7(L) - -  PLT 150 - 400 K/uL 149(L) - -  NEUTROABS 1.7 - 7.7 K/uL - - -  LYMPHSABS 0.7 - 4.0 K/uL - - -     There is no height or weight on file to calculate BMI.  Orders:  No orders of the  defined types were placed in this encounter.  No orders of the defined types were placed in this encounter.    Procedures: No procedures performed  Clinical Data: No additional findings.  ROS:  All other systems negative, except as noted in the HPI. Review of Systems  Objective: Vital Signs: There were no vitals taken for this visit.  Specialty Comments:  No specialty comments available.  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   Pseudoclaudication 11/15/2018   Essential hypertension 09/18/2018   S/P BKA (below knee amputation), right ([place]) 09/18/2018   Amputation of toe of left foot ([place]) 09/18/2018   Peripheral artery disease ([place]) 09/18/2018   S/P carotid endarterectomy 09/18/2018   OSA on CPAP 09/18/2018   Chronic back pain 09/18/2018   Status post reversal of ileostomy 05/21/2018   Normocytic anemia 02/14/2018   Intra-abdominal abscess ([place]) 11/04/2017   Chronic venous hypertension (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 ([place]) 12/11/2016   Unilateral primary osteoarthritis, left knee 10/11/2016   History of right below knee amputation ([place]) 07/12/2016   Diabetic foot infection ([place]) 05/04/2016   Type 2 diabetes mellitus with insulin therapy ([place]) 12/16/2015   Amputated toe ([place]) 10/21/2015   Leg edema, left 09/03/2015   CAD (dz of distal, mid and proximal RCA with implantation of 3 overlapping drug-eluting stent,) 09/03/2015   Chronic diastolic heart failure, NYHA class 2 ([place]) 09/03/2015   Angina pectoris associated with type 2 diabetes mellitus ([place]) 10/28/2014   Hyperlipidemia 08/25/2014   Limb pain 03/20/2013   DJD (degenerative joint disease) 09/25/2012   Migraine 09/25/2012   Neuropathy 09/25/2012   Restless legs syndrome (RLS) 09/25/2012   Chronic obstructive pulmonary disease, unspecified ([place]) 04/25/2012   Unknown cause of morbidity or mortality 04/25/2012   Chronic total occlusion of artery of the extremities ([place]) 04/08/2012   Onychomycosis 02/01/2012   Occlusion and stenosis of carotid artery without mention of cerebral infarction 06/12/2011   GERD (gastroesophageal reflux disease) 05/08/2011   Barrett's esophagus without dysplasia 05/08/2011   Former tobacco use 02/01/2011   Past Medical History:  Diagnosis Date   Carotid artery occlusion 11/10/10   LEFT CAROTID ENDARTERECTOMY   Chronic  kidney disease    Complication of anesthesia    BP WENT UP AT [facility] "   COPD (chronic obstructive pulmonary disease) ([place])    pt denies this dx as of 06/01/20 - no inhaler    Diabetes mellitus without complication ([place])    Diverticulitis    Diverticulosis of colon (without mention of hemorrhage)    DJD (degenerative joint disease)    knees/hands/feet/back/neck   Fatty liver    Full dentures    GERD (gastroesophageal reflux disease)    H/O hiatal hernia    History of blood transfusion    with a past surical procedure per patient 06/01/20   Hyperlipidemia    Hypertension    Neuromuscular disorder ([place])    peripheral neuropathy   Non-pressure chronic ulcer of other part of left foot limited to breakdown of skin ([place]) 11/12/2016   Osteomyelitis ([facility])    left 5th metatarsal  PAD (peripheral artery disease) ([place])    Distal aortogram June 2012. Atherectomy left popliteal artery July 2012.    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 Before age 43   Hyperlipidemia Brother    Hypertension Brother    Cancer Sister        Breast   Hyperlipidemia Sister    Hypertension Sister    Hypertension Son    Arthritis Other        GRANDMOTHER   Hypertension Other        OTHER FAMILY MEMBERS    Past Surgical History:  Procedure Laterality Date   AMPUTATION  11/05/2011   Procedure: AMPUTATION RAY;  Surgeon: Wylene Simmer, MD;  Location: [facility];  Service:  Orthopedics;  Laterality: Right;  Amputation of Right 4&5th Toes   AMPUTATION Left 11/26/2012   Procedure: AMPUTATION RAY;  Surgeon: Wylene Simmer, MD;  Location: [facility];  Service: Orthopedics;  Laterality: Left;  fourth ray amputation   AMPUTATION Right 08/27/2014   Procedure: Transmetatarsal Amputation;  Surgeon: Newt Minion, MD;  Location: [facility];  Service: Orthopedics;  Laterality: Right;   AMPUTATION Right 01/14/2015   Procedure: AMPUTATION BELOW KNEE;  Surgeon: Newt Minion, MD;  Location: [facility];  Service: Orthopedics;  Laterality: Right;   AMPUTATION Left 10/21/2015   Procedure: Left Foot 5th Ray Amputation;  Surgeon: Newt Minion, MD;  Location: [facility];  Service: Orthopedics;  Laterality: Left;   ANTERIOR FUSION CERVICAL SPINE  02/06/06   C4-5, C5-6, C6-7; SURGEON DR. MAX COHEN   AV FISTULA PLACEMENT Left 06/02/2020   Procedure: ARTERIOVENOUS (AV) FISTULA CREATION LEFT;  Surgeon: Waynetta Sandy, MD;  Location: [facility];  Service: Vascular;  Laterality: Left;   BACK SURGERY     x 3   [place] Left 07/21/2020   Procedure: LEFT UPPER ARM ATERIOVENOUS SUPERFISTULALIZATION;  Surgeon: Waynetta Sandy, MD;  Location: [facility];  Service: Vascular;  Laterality: Left;   BELOW KNEE LEG AMPUTATION Right    CARDIAC CATHETERIZATION  10/31/04   2009   CAROTID ENDARTERECTOMY  11/10/10   CAROTID ENDARTERECTOMY Left 11/10/2010   Subtotal occlusion of left internal carotid artery with left hemispheric transient ischemic attacks.   CAROTID STENT     CARPAL TUNNEL RELEASE Right 10/21/2013   Procedure: RIGHT CARPAL TUNNEL RELEASE;  Surgeon: Wynonia Sours, MD;  Location: [facility];  Service: Orthopedics;  Laterality: Right;   CHOLECYSTECTOMY     COLON SURGERY     COLONOSCOPY     COLOSTOMY REVERSAL  05/21/2018   ileostomy reversal   CYSTOSCOPY WITH STENT PLACEMENT Bilateral 01/13/2018  Procedure: CYSTOSCOPY WITH BILATERAL URETERAL CATHETER PLACEMENT;  Surgeon:  Ardis Hughs, MD;  Location: [facility];  Service: Urology;  Laterality: Bilateral;   ESOPHAGEAL MANOMETRY Bilateral 07/19/2014   Procedure: ESOPHAGEAL MANOMETRY (EM);  Surgeon: Jerene Bears, MD;  [facility];  Service: Gastroenterology;  Laterality: Bilateral;   EYE SURGERY Bilateral 2020   cataract   FEMORAL ARTERY STENT     x6   FINGER SURGERY     FOOT SURGERY  04/25/2016    EXCISION BASE 5TH METATARSAL AND PARTIAL CUBOID LEFT FOOT   HERNIA REPAIR     LEFT INGUINAL AND UMBILICAL REPAIRS   HERNIA REPAIR     I & D EXTREMITY Left 04/25/2016   Procedure: EXCISION BASE 5TH METATARSAL AND PARTIAL CUBOID LEFT FOOT;  Surgeon: Newt Minion, MD;  Location: [facility];  Service: Orthopedics;  Laterality: Left;   ILEOSTOMY  01/13/2018   Procedure: ILEOSTOMY;  Surgeon: Clovis Riley, MD;  Location: [facility];  Service: General;;   ILEOSTOMY CLOSURE N/A 05/21/2018   Procedure: ILEOSTOMY REVERSAL ERAS PATHWAY;  Surgeon: Clovis Riley, MD;  Location: [facility];  Service: General;  Laterality: N/A;   IR RADIOLOGIST EVAL & MGMT  11/19/2017   IR RADIOLOGIST EVAL & MGMT  12/03/2017   IR RADIOLOGIST EVAL & MGMT  12/18/2017   JOINT REPLACEMENT Right 2001   Total knee   LAMINECTOMY     X 3 LUMBAR AND X 2 CERVICAL SPINE OPERATIONS   LAPAROSCOPIC CHOLECYSTECTOMY W/ CHOLANGIOGRAPHY  11/09/04   SURGEON DR. [place] CATH AND CORONARY ANGIOGRAPHY N/A 09/16/2018   Procedure: LEFT HEART CATH AND CORONARY ANGIOGRAPHY;  Surgeon: Nigel Mormon, MD;  Location: [facility];  Service: Cardiovascular;  Laterality: N/A;   LEFT HEART CATHETERIZATION WITH CORONARY ANGIOGRAM N/A 10/29/2014   Procedure: LEFT HEART CATHETERIZATION WITH CORONARY ANGIOGRAM;  Surgeon: Laverda Page, MD;  Location: [facility];  Service: Cardiovascular;  Laterality: N/A;   LIGATION OF COMPETING BRANCHES OF ARTERIOVENOUS FISTULA Left 07/21/2020   Procedure: LIGATION OF COMPETING BRANCHES OF LEFT UPPER ARM  ARTERIOVENOUS FISTULA;  Surgeon: Waynetta Sandy, MD;  Location: [facility];  Service: Vascular;  Laterality: Left;   LOWER EXTREMITY ANGIOGRAM N/A 03/19/2012   Procedure: LOWER EXTREMITY ANGIOGRAM;  Surgeon: Burnell Blanks, MD;  Location: [facility];  Service: Cardiovascular;  Laterality: N/A;   NECK SURGERY     PARTIAL COLECTOMY N/A 01/13/2018   Procedure: LAPAROSCOPIC ASSISTED   SIGMOID COLECTOMY ILEOSTOMY;  Surgeon: Clovis Riley, MD;  Location[facility];  Service: General;  Laterality: N/A;   PENILE PROSTHESIS IMPLANT  08/14/05   INFRAPUBIC INSERTION OF INFLATABLE PENILE PROSTHESIS; SURGEON DR. Amalia Hailey   PENILE PROSTHESIS IMPLANT     PERCUTANEOUS CORONARY STENT INTERVENTION (PCI-S) Right 10/29/2014   Procedure: PERCUTANEOUS CORONARY STENT INTERVENTION (PCI-S);  Surgeon: Laverda Page, MD;  Location: [facility]ATH LAB;  Service: Cardiovascular;  Laterality: Right;   SHOULDER ARTHROSCOPY     SPINE SURGERY     TOE AMPUTATION Left    TONSILLECTOMY     TOTAL KNEE ARTHROPLASTY  07/2002   RIGHT KNEE ; SURGEON  DR. [name] ALSO HAD ARTHROSCOPIC RIGHT KNEE IN  10/2001   TOTAL KNEE ARTHROPLASTY     ULNAR NERVE TRANSPOSITION Right 10/21/2013   Procedure: RIGHT ELBOW  ULNAR NERVE DECOMPRESSION;  Surgeon: Wynonia Sours, MD;  Location: [facility];  Service: Orthopedics;  Laterality: Right;  Social History   Occupational History   Occupation: Magazine features editor: UNEMPLOYED  Tobacco Use   Smoking status: Former    Packs/day: 2.00    Years: 35.00    Pack years: 70.00    Types: Cigarettes    Quit date: 10/28/2011    Years since quitting: 9.3   Smokeless tobacco: Never  Vaping Use   Vaping Use: Some days   Substances: Nicotine  Substance and Sexual Activity   Alcohol use: Not Currently    Comment: "not in a long time"   Drug use: Never   Sexual activity: Yes    Birth control/protection: Implant    Comment: penile implant

## 2021-03-06 ENCOUNTER — Encounter: Payer: Self-pay | Admitting: Orthopedic Surgery

## 2021-03-06 ENCOUNTER — Ambulatory Visit (INDEPENDENT_AMBULATORY_CARE_PROVIDER_SITE_OTHER): Payer: 59 | Admitting: Orthopedic Surgery

## 2021-03-06 DIAGNOSIS — L97221 Non-pressure chronic ulcer of left calf limited to breakdown of skin: Secondary | ICD-10-CM | POA: Diagnosis not present

## 2021-03-06 DIAGNOSIS — I872 Venous insufficiency (chronic) (peripheral): Secondary | ICD-10-CM

## 2021-03-06 NOTE — Progress Notes (Signed)
Office Visit Note   Patient: [name]           Date of Birth: Jul 19, 1956           MRN: [id number] Visit Date: 03/06/2021              Requested by: [name], PA-C [address] [place],  [place] [address] PCP: [name], PA-C  Chief Complaint  Patient presents with   Left Ankle - Wound Check, Follow-up      HPI: Patient is a 65 year old gentleman who presents in follow-up for venous insufficiency with a recurrent ulcer over the lateral malleolus.  Patient's leg was completely healed he resumed wearing compression stockings and he states the ulcer broke down he has been wrapped but still has a ulcer over the lateral malleolus.  Assessment & Plan: Visit Diagnoses:  1. Skin ulcer of calf, left, limited to breakdown of skin ([place])     Plan: We will need to set the patient up with home health dressing changes twice a week with a silver alginate over the wound plus a 3 layer compression wrap.  Will follow-up in the office in 3 weeks.  Follow-Up Instructions: Return in about 3 weeks (around 03/27/2021).   Ortho Exam  Patient is alert, oriented, no adenopathy, well-dressed, normal affect, normal respiratory effort. Examination the compression wrap has slid down his leg distally there is excellent wrinkling of the skin there is no cellulitis however patient has recurrent ulcer over the lateral malleolus this is flat with a depth of 1 mm.  The ulcer is 2 x 3 cm in diameter.  Imaging: No results found. No images are attached to the encounter.  Labs: Lab Results  Component Value Date   HGBA1C 5.7 (H) 09/16/2018   HGBA1C 7.7 (H) 05/15/2018   HGBA1C 7.1 (H) 01/07/2018   ESRSEDRATE 35 (H) 09/03/2016   ESRSEDRATE 111 (H) 05/11/2016   ESRSEDRATE 98 (H) 05/04/2016   CRP 1.2 (H) 09/03/2016   CRP 4.7 (H) 05/11/2016   CRP 8.0 (H) 05/04/2016   REPTSTATUS 11/15/2017 FINAL 11/10/2017   GRAMSTAIN  11/04/2017    ABUNDANT WBC PRESENT, PREDOMINANTLY PMN MODERATE GRAM  NEGATIVE RODS MODERATE GRAM POSITIVE COCCI Performed at [facility], [place] [address] [place], [place] [address]    CULT  11/10/2017    NO GROWTH 5 DAYS Performed at [facility] [address], [place], [place] [address]    LABORGA STAPHYLOCOCCUS AUREUS 09/26/2014     Lab Results  Component Value Date   ALBUMIN 3.7 (L) 05/30/2020   ALBUMIN 2.9 (L) 06/19/2019   ALBUMIN 3.5 05/15/2018   PREALBUMIN 19.0 05/04/2016   PREALBUMIN 25.0 12/16/2015    Lab Results  Component Value Date   MG 1.9 05/24/2018   MG 2.1 05/23/2018   MG 1.3 (L) 05/22/2018   No results found for: VD25OH  Lab Results  Component Value Date   PREALBUMIN 19.0 05/04/2016   PREALBUMIN 25.0 12/16/2015   CBC EXTENDED Latest Ref Rng & Units 11/18/2020 10/17/2020 10/03/2020  WBC 4.0 - 10.5 K/uL 7.7 - -  RBC 4.22 - 5.81 MIL/uL 3.21(L) - -  HGB 13.0 - 17.0 g/dL 9.1(L) 9.8(L) 9.5(L)  HCT 39.0 - 52.0 % 28.7(L) - -  PLT 150 - 400 K/uL 149(L) - -  NEUTROABS 1.7 - 7.7 K/uL - - -  LYMPHSABS 0.7 - 4.0 K/uL - - -     There is no height or weight on file  to calculate BMI.  Orders:  No orders of the defined types were placed in this encounter.  No orders of the defined types were placed in this encounter.    Procedures: No procedures performed  Clinical Data: No additional findings.  ROS:  All other systems negative, except as noted in the HPI. Review of Systems  Objective: Vital Signs: There were no vitals taken for this visit.  Specialty Comments:  No specialty comments available.  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   Pseudoclaudication 11/15/2018   Essential hypertension 09/18/2018   S/P BKA (below knee amputation), right ([place]) 09/18/2018   Amputation of toe of left foot ([place]) 09/18/2018   Peripheral artery disease ([place]) 09/18/2018   S/P carotid endarterectomy 09/18/2018   OSA on CPAP 09/18/2018   Chronic back pain 09/18/2018   Status post reversal of ileostomy 05/21/2018   Normocytic anemia 02/14/2018   Intra-abdominal abscess ([place]) 11/04/2017   Chronic venous hypertension (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 ([place]) 12/11/2016   Unilateral primary osteoarthritis, left knee 10/11/2016   History of right below knee amputation ([place]) 07/12/2016   Diabetic foot infection ([place]) 05/04/2016   Type 2 diabetes mellitus with insulin therapy ([place]) 12/16/2015   Amputated toe ([place]) 10/21/2015   Leg edema, left 09/03/2015   CAD (dz of distal, mid and proximal RCA with implantation of 3 overlapping drug-eluting stent,) 09/03/2015   Chronic diastolic heart failure, NYHA class 2 ([place]) 09/03/2015   Angina pectoris associated with type 2 diabetes mellitus ([place]) 10/28/2014   Hyperlipidemia 08/25/2014   Limb pain 03/20/2013   DJD (degenerative joint disease) 09/25/2012   Migraine 09/25/2012   Neuropathy 09/25/2012   Restless legs syndrome (RLS) 09/25/2012   Chronic obstructive pulmonary disease, unspecified ([place]) 04/25/2012   Unknown cause of morbidity or mortality 04/25/2012   Chronic total occlusion of artery of the extremities ([place]) 04/08/2012   Onychomycosis 02/01/2012   Occlusion and stenosis of carotid artery without mention of cerebral infarction 06/12/2011   GERD (gastroesophageal reflux disease) 05/08/2011   Barrett's esophagus without dysplasia 05/08/2011   Former tobacco use 02/01/2011   Past Medical History:  Diagnosis Date   Carotid artery occlusion 11/10/10   LEFT CAROTID ENDARTERECTOMY   Chronic  kidney disease    Complication of anesthesia    BP WENT UP AT [facility] "   COPD (chronic obstructive pulmonary disease) ([place])    pt denies this dx as of 06/01/20 - no inhaler    Diabetes mellitus without complication ([place])    Diverticulitis    Diverticulosis of colon (without mention of hemorrhage)    DJD (degenerative joint disease)    knees/hands/feet/back/neck   Fatty liver    Full dentures    GERD (gastroesophageal reflux disease)    H/O hiatal hernia    History of blood transfusion    with a past surical procedure per patient 06/01/20   Hyperlipidemia    Hypertension    Neuromuscular disorder ([place])    peripheral neuropathy   Non-pressure chronic ulcer of other part of left foot limited to breakdown of skin ([place]) 11/12/2016  Osteomyelitis ([place])    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 Atherectomy left popliteal artery July 2012.    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 Before age 49   Hyperlipidemia Brother    Hypertension Brother    Cancer Sister        Breast   Hyperlipidemia Sister    Hypertension Sister    Hypertension Son    Arthritis Other        GRANDMOTHER   Hypertension Other        OTHER FAMILY MEMBERS    Past Surgical History:  Procedure Laterality Date   AMPUTATION  11/05/2011   Procedure: AMPUTATION RAY;  Surgeon: Wylene Simmer, MD;  Location: [facility];  Service:  Orthopedics;  Laterality: Right;  Amputation of Right 4&5th Toes   AMPUTATION Left 11/26/2012   Procedure: AMPUTATION RAY;  Surgeon: Wylene Simmer, MD;  Location: [facility];  Service: Orthopedics;  Laterality: Left;  fourth ray amputation   AMPUTATION Right 08/27/2014   Procedure: Transmetatarsal Amputation;  Surgeon: Newt Minion, MD;  Location: [facility];  Service: Orthopedics;  Laterality: Right;   AMPUTATION Right 01/14/2015   Procedure: AMPUTATION BELOW KNEE;  Surgeon: Newt Minion, MD;  Location: [facility];  Service: Orthopedics;  Laterality: Right;   AMPUTATION Left 10/21/2015   Procedure: Left Foot 5th Ray Amputation;  Surgeon: Newt Minion, MD;  Location: [facility];  Service: Orthopedics;  Laterality: Left;   ANTERIOR FUSION CERVICAL SPINE  02/06/06   C4-5, C5-6, C6-7; SURGEON DR. MAX COHEN   AV FISTULA PLACEMENT Left 06/02/2020   Procedure: ARTERIOVENOUS (AV) FISTULA CREATION LEFT;  Surgeon: Waynetta Sandy, MD;  Location: [facility];  Service: Vascular;  Laterality: Left;   BACK SURGERY     x 3   [place] Left 07/21/2020   Procedure: LEFT UPPER ARM ATERIOVENOUS SUPERFISTULALIZATION;  Surgeon: Waynetta Sandy, MD;  Location: [facility];  Service: Vascular;  Laterality: Left;   BELOW KNEE LEG AMPUTATION Right    CARDIAC CATHETERIZATION  10/31/04   2009   CAROTID ENDARTERECTOMY  11/10/10   CAROTID ENDARTERECTOMY Left 11/10/2010   Subtotal occlusion of left internal carotid artery with left hemispheric transient ischemic attacks.   CAROTID STENT     CARPAL TUNNEL RELEASE Right 10/21/2013   Procedure: RIGHT CARPAL TUNNEL RELEASE;  Surgeon: Wynonia Sours, MD;  Location: [facility];  Service: Orthopedics;  Laterality: Right;   CHOLECYSTECTOMY     COLON SURGERY     COLONOSCOPY     COLOSTOMY REVERSAL  05/21/2018   ileostomy reversal  CYSTOSCOPY WITH STENT PLACEMENT Bilateral 01/13/2018   Procedure: CYSTOSCOPY WITH BILATERAL URETERAL CATHETER PLACEMENT;  Surgeon:  Ardis Hughs, MD;  Location: [facility];  Service: Urology;  Laterality: Bilateral;   ESOPHAGEAL MANOMETRY Bilateral 07/19/2014   Procedure: ESOPHAGEAL MANOMETRY (EM);  Surgeon: Jerene Bears, MD;  Location[facility];  Service: Gastroenterology;  Laterality: Bilateral;   EYE SURGERY Bilateral 2020   cataract   FEMORAL ARTERY STENT     x6   FINGER SURGERY     FOOT SURGERY  04/25/2016    EXCISION BASE 5TH METATARSAL AND PARTIAL CUBOID LEFT FOOT   HERNIA REPAIR     LEFT INGUINAL AND UMBILICAL REPAIRS   HERNIA REPAIR     I & D EXTREMITY Left 04/25/2016   Procedure: EXCISION BASE 5TH METATARSAL AND PARTIAL CUBOID LEFT FOOT;  Surgeon: Newt Minion, MD;  Location: [facility];  Service: Orthopedics;  Laterality: Left;   ILEOSTOMY  01/13/2018   Procedure: ILEOSTOMY;  Surgeon: Clovis Riley, MD;  Location[facility];  Service: General;;   ILEOSTOMY CLOSURE N/A 05/21/2018   Procedure: ILEOSTOMY REVERSAL ERAS PATHWAY;  Surgeon: Clovis Riley, MD;  Location: [facility];  Service: General;  Laterality: N/A;   IR RADIOLOGIST EVAL & MGMT  11/19/2017   IR RADIOLOGIST EVAL & MGMT  12/03/2017   IR RADIOLOGIST EVAL & MGMT  12/18/2017   JOINT REPLACEMENT Right 2001   Total knee   LAMINECTOMY     X 3 LUMBAR AND X 2 CERVICAL SPINE OPERATIONS   LAPAROSCOPIC CHOLECYSTECTOMY W/ CHOLANGIOGRAPHY  11/09/04   SURGEON DR. Carlisle CATH AND CORONARY ANGIOGRAPHY N/A 09/16/2018   Procedure: LEFT HEART CATH AND CORONARY ANGIOGRAPHY;  Surgeon: Nigel Mormon, MD;  Location: [facility];  Service: Cardiovascular;  Laterality: N/A;   LEFT HEART CATHETERIZATION WITH CORONARY ANGIOGRAM N/A 10/29/2014   Procedure: LEFT HEART CATHETERIZATION WITH CORONARY ANGIOGRAM;  Surgeon: Laverda Page, MD;  Location: [facility];  Service: Cardiovascular;  Laterality: N/A;   LIGATION OF COMPETING BRANCHES OF ARTERIOVENOUS FISTULA Left 07/21/2020   Procedure: LIGATION OF COMPETING BRANCHES OF LEFT UPPER ARM  ARTERIOVENOUS FISTULA;  Surgeon: Waynetta Sandy, MD;  Location: [facility];  Service: Vascular;  Laterality: Left;   LOWER EXTREMITY ANGIOGRAM N/A 03/19/2012   Procedure: LOWER EXTREMITY ANGIOGRAM;  Surgeon: Burnell Blanks, MD;  Location: [facility];  Service: Cardiovascular;  Laterality: N/A;   NECK SURGERY     PARTIAL COLECTOMY N/A 01/13/2018   Procedure: LAPAROSCOPIC ASSISTED   SIGMOID COLECTOMY ILEOSTOMY;  Surgeon: Clovis Riley, MD;  Location[facility];  Service: General;  Laterality: N/A;   PENILE PROSTHESIS IMPLANT  08/14/05   INFRAPUBIC INSERTION OF INFLATABLE PENILE PROSTHESIS; SURGEON DR. Amalia Hailey   PENILE PROSTHESIS IMPLANT     PERCUTANEOUS CORONARY STENT INTERVENTION (PCI-S) Right 10/29/2014   Procedure: PERCUTANEOUS CORONARY STENT INTERVENTION (PCI-S);  Surgeon: Laverda Page, MD;  Location: [facility] LAB;  Service: Cardiovascular;  Laterality: Right;   SHOULDER ARTHROSCOPY     SPINE SURGERY     TOE AMPUTATION Left    TONSILLECTOMY     TOTAL KNEE ARTHROPLASTY  07/2002   RIGHT KNEE ; SURGEON  DR. [name] ALSO HAD ARTHROSCOPIC RIGHT KNEE IN  10/2001   TOTAL KNEE ARTHROPLASTY     ULNAR NERVE TRANSPOSITION Right 10/21/2013   Procedure: RIGHT ELBOW  ULNAR NERVE DECOMPRESSION;  Surgeon: Wynonia Sours, MD;  Location: [facility];  Service: Orthopedics;  Laterality: Right;   Social History   Occupational History   Occupation: Magazine features editor: UNEMPLOYED  Tobacco Use   Smoking status: Former    Packs/day: 2.00    Years: 35.00    Pack years: 70.00    Types: Cigarettes    Quit date: 10/28/2011    Years since quitting: 9.3   Smokeless tobacco: Never  Vaping Use   Vaping Use: Some days   Substances: Nicotine  Substance and Sexual Activity   Alcohol use: Not Currently    Comment: "not in a long time"   Drug use: Never   Sexual activity: Yes    Birth control/protection: Implant    Comment: penile implant

## 2021-03-08 ENCOUNTER — Telehealth: Payer: Self-pay | Admitting: Orthopedic Surgery

## 2021-03-08 ENCOUNTER — Telehealth: Payer: Self-pay

## 2021-03-08 ENCOUNTER — Other Ambulatory Visit: Payer: Self-pay

## 2021-03-08 NOTE — Telephone Encounter (Signed)
Can you please call pt and make an appt for [facility] or [facility]? Dressing change

## 2021-03-08 NOTE — Telephone Encounter (Signed)
Dorian Pod with [facility] called stating they won't be able to accept the referral due to staffing. If Autumn has any questions her CB # is   [phone]

## 2021-03-08 NOTE — Telephone Encounter (Signed)
Pt called about his wound care check.   CB [phone]

## 2021-03-08 NOTE — Telephone Encounter (Signed)
New HHN referral made for 2 x a week profore dressing changes. Faxed to [facility] [phone]

## 2021-03-08 NOTE — Telephone Encounter (Signed)
I called and lm on vm to advise pt of message below. Advised that we can see him weekly in the office for dressing changes. To call and let me know

## 2021-03-09 NOTE — Telephone Encounter (Signed)
PT called and states he needs to make an appt tomorrow to get his dressing changed.   CB [phone]

## 2021-03-09 NOTE — Telephone Encounter (Signed)
Can you please call pt and make an appt. Dr. Tommi Rumps schedule should be open in the afternoon.

## 2021-03-10 ENCOUNTER — Encounter: Payer: Self-pay | Admitting: Orthopedic Surgery

## 2021-03-10 ENCOUNTER — Telehealth: Payer: Self-pay

## 2021-03-10 ENCOUNTER — Ambulatory Visit (INDEPENDENT_AMBULATORY_CARE_PROVIDER_SITE_OTHER): Payer: 59 | Admitting: Physician Assistant

## 2021-03-10 DIAGNOSIS — L97221 Non-pressure chronic ulcer of left calf limited to breakdown of skin: Secondary | ICD-10-CM | POA: Diagnosis not present

## 2021-03-10 NOTE — Telephone Encounter (Signed)
Advised pt while here in the office today that I have tried [facility] and they declined referral because they do not have enough staff. I tried [facility] and they do not service the area, I tried [facility] and they do not partner with the pt's insurance. I advised the pt that he will have to come to the office for dressing change.

## 2021-03-10 NOTE — Progress Notes (Signed)
Office Visit Note   Patient: [name]           Date of Birth: 1956-02-28           MRN: [id number] Visit Date: 03/10/2021              Requested by: [name], PA-C [address] [place],  [place] [address] PCP: [name], PA-C  Chief Complaint  Patient presents with   Left Leg - Wound Check      HPI: Patient presents today in follow-up for his left leg.  At his visit earlier this week he had a silver alginate placed over his lateral ankle wound and a compression wrap over this.  He was to have home health do this twice weekly but they cannot accommodate him right now.  Assessment & Plan: Visit Diagnoses: No diagnosis found.  Plan: We will apply silver alginate and 4-layer wrap today.  Follow-up on Wednesday.  Follow-Up Instructions: No follow-ups on file.   Ortho Exam  Patient is alert, oriented, no adenopathy, well-dressed, normal affect, normal respiratory effort. Examination demonstrates improved decrease in swelling.  The proximal calf wound has dried out and has just has mild erythema but no ascending cellulitis.  Distal lateral ankle wound had some fibrinous tissue in the wound bed this was removed to healthy bleeding tissue.  No foul odor no ascending cellulitis or signs of infection  Imaging: No results found. No images are attached to the encounter.  Labs: Lab Results  Component Value Date   HGBA1C 5.7 (H) 09/16/2018   HGBA1C 7.7 (H) 05/15/2018   HGBA1C 7.1 (H) 01/07/2018   ESRSEDRATE 35 (H) 09/03/2016   ESRSEDRATE 111 (H) 05/11/2016   ESRSEDRATE 98 (H) 05/04/2016   CRP 1.2 (H) 09/03/2016   CRP 4.7 (H) 05/11/2016   CRP 8.0 (H) 05/04/2016   REPTSTATUS 11/15/2017 FINAL 11/10/2017   GRAMSTAIN  11/04/2017    ABUNDANT WBC PRESENT, PREDOMINANTLY PMN MODERATE GRAM NEGATIVE RODS MODERATE GRAM POSITIVE COCCI Performed at [facility], [place] [address], [place], [place] [address]    CULT  11/10/2017    NO GROWTH 5 DAYS Performed at [facility] [address], [place], [place] [address]    LABORGA STAPHYLOCOCCUS AUREUS 09/26/2014     Lab Results  Component Value Date   ALBUMIN 3.7 (L) 05/30/2020   ALBUMIN 2.9 (L) 06/19/2019   ALBUMIN 3.5 05/15/2018   PREALBUMIN 19.0 05/04/2016   PREALBUMIN 25.0 12/16/2015    Lab Results  Component Value Date   MG 1.9 05/24/2018   MG 2.1 05/23/2018   MG 1.3 (L) 05/22/2018   No results found for: VD25OH  Lab Results  Component Value Date   PREALBUMIN 19.0 05/04/2016   PREALBUMIN 25.0 12/16/2015   CBC EXTENDED Latest Ref Rng & Units 11/18/2020 10/17/2020 10/03/2020  WBC 4.0 - 10.5 K/uL 7.7 - -  RBC 4.22 - 5.81 MIL/uL 3.21(L) - -  HGB 13.0 - 17.0 g/dL 9.1(L) 9.8(L) 9.5(L)  HCT 39.0 - 52.0 % 28.7(L) - -  PLT 150 - 400 K/uL 149(L) - -  NEUTROABS 1.7 - 7.7 K/uL - - -  LYMPHSABS 0.7 - 4.0 K/uL - - -     There is no height or weight on file to calculate BMI.  Orders:  No orders of the defined types were placed in this encounter.  No orders of the defined types were placed in this encounter.    Procedures: No procedures performed  Clinical Data: No additional findings.  ROS:  All other systems negative, except as noted in the HPI. Review of Systems  Objective: Vital Signs: There were no vitals taken for this visit.  Specialty Comments:  No specialty comments available.  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   Pseudoclaudication 11/15/2018   Essential hypertension 09/18/2018   S/P BKA (below knee amputation), right ([place])  09/18/2018   Amputation of toe of left foot ([place]) 09/18/2018   Peripheral artery disease ([place]) 09/18/2018   S/P carotid endarterectomy 09/18/2018   OSA on CPAP 09/18/2018   Chronic back pain 09/18/2018   Status post reversal of ileostomy 05/21/2018   Normocytic anemia 02/14/2018   Intra-abdominal abscess ([place]) 11/04/2017   Chronic venous hypertension (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 ([place]) 12/11/2016   Unilateral primary osteoarthritis, left knee 10/11/2016   History of right below knee amputation ([place]) 07/12/2016   Diabetic foot infection ([place]) 05/04/2016   Type 2 diabetes mellitus with insulin therapy ([place]) 12/16/2015   Amputated toe ([place]) 10/21/2015   Leg edema, left 09/03/2015   CAD (dz of distal, mid and proximal RCA with implantation of 3 overlapping drug-eluting stent,) 09/03/2015   Chronic diastolic heart failure, NYHA class 2 ([place]) 09/03/2015   Angina pectoris associated with type 2 diabetes mellitus ([place]) 10/28/2014   Hyperlipidemia 08/25/2014   Limb pain 03/20/2013   DJD (degenerative joint disease) 09/25/2012   Migraine 09/25/2012   Neuropathy 09/25/2012   Restless legs syndrome (RLS) 09/25/2012   Chronic obstructive pulmonary disease, unspecified ([place]) 04/25/2012   Unknown cause of morbidity or mortality 04/25/2012   Chronic total occlusion of artery of the extremities ([place]) 04/08/2012   Onychomycosis 02/01/2012   Occlusion and stenosis of carotid artery without mention of cerebral infarction 06/12/2011   GERD (gastroesophageal reflux disease) 05/08/2011   Barrett's esophagus without dysplasia 05/08/2011   Former tobacco use 02/01/2011   Past Medical History:  Diagnosis Date   Carotid artery occlusion 11/10/10   LEFT CAROTID ENDARTERECTOMY   Chronic kidney disease    Complication of anesthesia    BP WENT UP AT [facility] "   COPD (chronic obstructive pulmonary disease) ([place])    pt denies this dx as of 06/01/20 - no inhaler    Diabetes  mellitus without complication ([place])    Diverticulitis    Diverticulosis of colon (without mention of hemorrhage)    DJD (degenerative joint disease)    knees/hands/feet/back/neck   Fatty liver    Full dentures    GERD (gastroesophageal reflux disease)    H/O hiatal hernia    History of blood transfusion    with a past surical procedure per patient 06/01/20   Hyperlipidemia    Hypertension    Neuromuscular disorder ([place])    peripheral neuropathy   Non-pressure chronic ulcer of other part of left foot limited to breakdown of skin ([place]) 11/12/2016   Osteomyelitis ([facility])    left 5th metatarsal   PAD (peripheral artery disease) ([place])    Distal aortogram June 2012. Atherectomy left popliteal artery July 2012.    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 Before age 22   Hyperlipidemia Brother    Hypertension Brother    Cancer Sister        Breast   Hyperlipidemia Sister    Hypertension Sister    Hypertension Son    Arthritis Other        GRANDMOTHER   Hypertension Other        OTHER FAMILY MEMBERS    Past Surgical History:  Procedure Laterality Date   AMPUTATION  11/05/2011   Procedure: AMPUTATION RAY;  Surgeon: Wylene Simmer, MD;  Location: [facility];  Service: Orthopedics;  Laterality: Right;  Amputation of Right 4&5th Toes   AMPUTATION Left 11/26/2012   Procedure: AMPUTATION RAY;  Surgeon: Wylene Simmer, MD;  Location: [facility];  Service:  Orthopedics;  Laterality: Left;  fourth ray amputation   AMPUTATION Right 08/27/2014   Procedure: Transmetatarsal Amputation;  Surgeon: Newt Minion, MD;  Location: [facility];  Service: Orthopedics;  Laterality: Right;   AMPUTATION Right 01/14/2015   Procedure: AMPUTATION BELOW KNEE;  Surgeon: Newt Minion, MD;  Location: [facility];  Service: Orthopedics;  Laterality: Right;   AMPUTATION Left 10/21/2015   Procedure: Left Foot 5th Ray Amputation;  Surgeon: Newt Minion, MD;  Location: [facility];  Service: Orthopedics;  Laterality: Left;   ANTERIOR FUSION CERVICAL SPINE  02/06/06   C4-5, C5-6, C6-7; SURGEON DR. MAX COHEN   AV FISTULA PLACEMENT Left 06/02/2020   Procedure: ARTERIOVENOUS (AV) FISTULA CREATION LEFT;  Surgeon: Waynetta Sandy, MD;  Location: [facility];  Service: Vascular;  Laterality: Left;   BACK SURGERY     x 3   [place] Left 07/21/2020   Procedure: LEFT UPPER ARM ATERIOVENOUS SUPERFISTULALIZATION;  Surgeon: Waynetta Sandy, MD;  Location: [facility];  Service: Vascular;  Laterality: Left;   BELOW KNEE LEG AMPUTATION Right    CARDIAC CATHETERIZATION  10/31/04   2009   CAROTID ENDARTERECTOMY  11/10/10   CAROTID ENDARTERECTOMY Left 11/10/2010   Subtotal occlusion of left internal carotid artery with left hemispheric transient ischemic attacks.   CAROTID STENT     CARPAL TUNNEL RELEASE Right 10/21/2013   Procedure: RIGHT CARPAL TUNNEL RELEASE;  Surgeon: Wynonia Sours, MD;  Location: [facility];  Service: Orthopedics;  Laterality: Right;   CHOLECYSTECTOMY     COLON SURGERY     COLONOSCOPY     COLOSTOMY REVERSAL  05/21/2018   ileostomy reversal   CYSTOSCOPY WITH STENT PLACEMENT Bilateral 01/13/2018   Procedure: CYSTOSCOPY WITH BILATERAL URETERAL CATHETER PLACEMENT;  Surgeon: Ardis Hughs, MD;  Location[facility];  Service: Urology;  Laterality: Bilateral;  ESOPHAGEAL MANOMETRY Bilateral 07/19/2014   Procedure: ESOPHAGEAL MANOMETRY (EM);  Surgeon:  Jerene Bears, MD;  [facility];  Service: Gastroenterology;  Laterality: Bilateral;   EYE SURGERY Bilateral 2020   cataract   FEMORAL ARTERY STENT     x6   FINGER SURGERY     FOOT SURGERY  04/25/2016    EXCISION BASE 5TH METATARSAL AND PARTIAL CUBOID LEFT FOOT   HERNIA REPAIR     LEFT INGUINAL AND UMBILICAL REPAIRS   HERNIA REPAIR     I & D EXTREMITY Left 04/25/2016   Procedure: EXCISION BASE 5TH METATARSAL AND PARTIAL CUBOID LEFT FOOT;  Surgeon: Newt Minion, MD;  Location: [facility];  Service: Orthopedics;  Laterality: Left;   ILEOSTOMY  01/13/2018   Procedure: ILEOSTOMY;  Surgeon: Clovis Riley, MD;  Location: [facility];  Service: General;;   ILEOSTOMY CLOSURE N/A 05/21/2018   Procedure: ILEOSTOMY REVERSAL ERAS PATHWAY;  Surgeon: Clovis Riley, MD;  Location[place];  Service: General;  Laterality: N/A;   IR RADIOLOGIST EVAL & MGMT  11/19/2017   IR RADIOLOGIST EVAL & MGMT  12/03/2017   IR RADIOLOGIST EVAL & MGMT  12/18/2017   JOINT REPLACEMENT Right 2001   Total knee   LAMINECTOMY     X 3 LUMBAR AND X 2 CERVICAL SPINE OPERATIONS   LAPAROSCOPIC CHOLECYSTECTOMY W/ CHOLANGIOGRAPHY  11/09/04   SURGEON DR. Cacao CATH AND CORONARY ANGIOGRAPHY N/A 09/16/2018   Procedure: LEFT HEART CATH AND CORONARY ANGIOGRAPHY;  Surgeon: Nigel Mormon, MD;  Location: [facility];  Service: Cardiovascular;  Laterality: N/A;   LEFT HEART CATHETERIZATION WITH CORONARY ANGIOGRAM N/A 10/29/2014   Procedure: LEFT HEART CATHETERIZATION WITH CORONARY ANGIOGRAM;  Surgeon: Laverda Page, MD;  Location: [facility];  Service: Cardiovascular;  Laterality: N/A;   LIGATION OF COMPETING BRANCHES OF ARTERIOVENOUS FISTULA Left 07/21/2020   Procedure: LIGATION OF COMPETING BRANCHES OF LEFT UPPER ARM ARTERIOVENOUS FISTULA;  Surgeon: Waynetta Sandy, MD;  Location: [facility] [place];  Service: Vascular;  Laterality: Left;   LOWER EXTREMITY ANGIOGRAM N/A 03/19/2012   Procedure: LOWER  EXTREMITY ANGIOGRAM;  Surgeon: Burnell Blanks, MD;  Location: [facility];  Service: Cardiovascular;  Laterality: N/A;   NECK SURGERY     PARTIAL COLECTOMY N/A 01/13/2018   Procedure: LAPAROSCOPIC ASSISTED   SIGMOID COLECTOMY ILEOSTOMY;  Surgeon: Clovis Riley, MD;  Location[facility];  Service: General;  Laterality: N/A;   PENILE PROSTHESIS IMPLANT  08/14/05   INFRAPUBIC INSERTION OF INFLATABLE PENILE PROSTHESIS; SURGEON DR. Amalia Hailey   PENILE PROSTHESIS IMPLANT     PERCUTANEOUS CORONARY STENT INTERVENTION (PCI-S) Right 10/29/2014   Procedure: PERCUTANEOUS CORONARY STENT INTERVENTION (PCI-S);  Surgeon: Laverda Page, MD;  Location: [facility]ATH LAB;  Service: Cardiovascular;  Laterality: Right;   SHOULDER ARTHROSCOPY     SPINE SURGERY     TOE AMPUTATION Left    TONSILLECTOMY     TOTAL KNEE ARTHROPLASTY  07/2002   RIGHT KNEE ; SURGEON  DR. [name] ALSO HAD ARTHROSCOPIC RIGHT KNEE IN  10/2001   TOTAL KNEE ARTHROPLASTY     ULNAR NERVE TRANSPOSITION Right 10/21/2013   Procedure: RIGHT ELBOW  ULNAR NERVE DECOMPRESSION;  Surgeon: Wynonia Sours, MD;  Location: [place];  Service: Orthopedics;  Laterality: Right;   Social History   Occupational History   Occupation: Magazine features editor: UNEMPLOYED  Tobacco Use   Smoking status: Former  Packs/day: 2.00    Years: 35.00    Pack years: 70.00    Types: Cigarettes    Quit date: 10/28/2011    Years since quitting: 9.3   Smokeless tobacco: Never  Vaping Use   Vaping Use: Some days   Substances: Nicotine  Substance and Sexual Activity   Alcohol use: Not Currently    Comment: "not in a long time"   Drug use: Never   Sexual activity: Yes    Birth control/protection: Implant    Comment: penile implant

## 2021-03-15 ENCOUNTER — Ambulatory Visit (INDEPENDENT_AMBULATORY_CARE_PROVIDER_SITE_OTHER): Payer: 59 | Admitting: Family

## 2021-03-15 ENCOUNTER — Encounter: Payer: Self-pay | Admitting: Family

## 2021-03-15 DIAGNOSIS — L97929 Non-pressure chronic ulcer of unspecified part of left lower leg with unspecified severity: Secondary | ICD-10-CM

## 2021-03-15 DIAGNOSIS — I87332 Chronic venous hypertension (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: Secondary | ICD-10-CM

## 2021-03-15 NOTE — Progress Notes (Signed)
Office Visit Note   Patient: [name]           Date of Birth: June 26, 1956           MRN: [id number] Visit Date: 03/15/2021              Requested by: [name], PA-C [address] [place],  [place] [address] PCP: [name], PA-C  No chief complaint on file.     HPI: The patient is a 65 year old gentleman seen today for follow-up for edema and ulceration to his left lower extremity.  He has been having serial compression wraps.  We are attempting to find a home health agency that can assist with his wound care.    Assessment & Plan: Visit Diagnoses: No diagnosis found.  Plan: We will continue with twice weekly wraps until healed.  We will apply silver alginate and 4-layer wrap today.  Follow-up on Wednesday.  Follow-Up Instructions: Return in about 5 days (around 03/20/2021).   Ortho Exam  Patient is alert, oriented, no adenopathy, well-dressed, normal affect, normal respiratory effort.  On examination of the left lower extremity.  There is good improvement in the swelling with wrinkling of the skin and dry flaking of the skin over his lateral malleolus he continues with ulceration this is 15 mm x 5 mm in size there is some flat pink tissue in the wound bed.  There is no active drainage no surrounding erythema no maceration no sign of infection   Imaging: No results found. No images are attached to the encounter.  Labs: Lab Results  Component Value Date   HGBA1C 5.7 (H) 09/16/2018   HGBA1C 7.7 (H) 05/15/2018   HGBA1C 7.1 (H) 01/07/2018   ESRSEDRATE 35 (H) 09/03/2016   ESRSEDRATE 111 (H) 05/11/2016   ESRSEDRATE 98 (H) 05/04/2016   CRP 1.2 (H) 09/03/2016   CRP 4.7 (H) 05/11/2016   CRP 8.0 (H) 05/04/2016   REPTSTATUS 11/15/2017 FINAL 11/10/2017   GRAMSTAIN  11/04/2017    ABUNDANT WBC PRESENT, PREDOMINANTLY PMN MODERATE GRAM NEGATIVE RODS MODERATE GRAM POSITIVE COCCI Performed at [facility], [place] [address], [place], [place] [address]     CULT  11/10/2017    NO GROWTH 5 DAYS Performed at [facility] [address] [place],  [address]    LABORGA STAPHYLOCOCCUS AUREUS 09/26/2014     Lab Results  Component Value Date   ALBUMIN 3.7 (L) 05/30/2020   ALBUMIN 2.9 (L) 06/19/2019   ALBUMIN 3.5 05/15/2018   PREALBUMIN 19.0 05/04/2016   PREALBUMIN 25.0 12/16/2015    Lab Results  Component Value Date   MG 1.9 05/24/2018   MG 2.1 05/23/2018   MG 1.3 (L) 05/22/2018   No results found for: VD25OH  Lab Results  Component Value Date   PREALBUMIN 19.0 05/04/2016   PREALBUMIN 25.0 12/16/2015   CBC EXTENDED Latest Ref Rng & Units 11/18/2020 10/17/2020 10/03/2020  WBC 4.0 - 10.5 K/uL 7.7 - -  RBC 4.22 - 5.81 MIL/uL 3.21(L) - -  HGB 13.0 - 17.0 g/dL 9.1(L) 9.8(L) 9.5(L)  HCT 39.0 - 52.0 % 28.7(L) - -  PLT 150 - 400 K/uL 149(L) - -  NEUTROABS 1.7 - 7.7 K/uL - - -  LYMPHSABS 0.7 - 4.0 K/uL - - -     There is no height or weight on file to calculate BMI.  Orders:  No orders of the defined types were placed in this encounter.  No orders of the  defined types were placed in this encounter.    Procedures: No procedures performed  Clinical Data: No additional findings.  ROS:  All other systems negative, except as noted in the HPI. Review of Systems  Skin:  Positive for wound. Negative for color change.   Objective: Vital Signs: There were no vitals taken for this visit.  Specialty Comments:  No specialty comments available.  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    Pseudoclaudication 11/15/2018   Essential hypertension 09/18/2018   S/P BKA (below knee amputation), right ([place]) 09/18/2018   Amputation of toe of left foot ([place]) 09/18/2018   Peripheral artery disease ([place]) 09/18/2018   S/P carotid endarterectomy 09/18/2018   OSA on CPAP 09/18/2018   Chronic back pain 09/18/2018   Status post reversal of ileostomy 05/21/2018   Normocytic anemia 02/14/2018   Intra-abdominal abscess ([place]) 11/04/2017   Chronic venous hypertension (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 ([place]) 12/11/2016   Unilateral primary osteoarthritis, left knee 10/11/2016   History of right below knee amputation ([place]) 07/12/2016   Diabetic foot infection ([place]) 05/04/2016   Type 2 diabetes mellitus with insulin therapy ([place]) 12/16/2015   Amputated toe ([place]) 10/21/2015   Leg edema, left 09/03/2015   CAD (dz of distal, mid and proximal RCA with implantation of 3 overlapping drug-eluting stent,) 09/03/2015   Chronic diastolic heart failure, NYHA class 2 ([place]) 09/03/2015   Angina pectoris associated with type 2 diabetes mellitus ([place]) 10/28/2014   Hyperlipidemia 08/25/2014   Limb pain 03/20/2013   DJD (degenerative joint disease) 09/25/2012   Migraine 09/25/2012   Neuropathy 09/25/2012   Restless legs syndrome (RLS) 09/25/2012   Chronic obstructive pulmonary disease, unspecified ([place]) 04/25/2012   Unknown cause of morbidity or mortality 04/25/2012   Chronic total occlusion of artery of the extremities ([place]) 04/08/2012   Onychomycosis 02/01/2012   Occlusion and stenosis of carotid artery without mention of cerebral infarction 06/12/2011   GERD (gastroesophageal reflux disease) 05/08/2011   Barrett's esophagus without dysplasia 05/08/2011   Former tobacco use 02/01/2011   Past Medical History:  Diagnosis Date   Carotid artery occlusion 11/10/10   LEFT CAROTID ENDARTERECTOMY   Chronic kidney disease    Complication of anesthesia    BP WENT UP AT [facility] "    COPD (chronic obstructive pulmonary disease) ([place])    pt denies this dx as of 06/01/20 - no inhaler    Diabetes mellitus without complication ([place])    Diverticulitis    Diverticulosis of colon (without mention of hemorrhage)    DJD (degenerative joint disease)    knees/hands/feet/back/neck   Fatty liver    Full dentures    GERD (gastroesophageal reflux disease)    H/O hiatal hernia    History of blood transfusion    with a past surical procedure per patient 06/01/20   Hyperlipidemia    Hypertension    Neuromuscular disorder ([place])    peripheral neuropathy   Non-pressure chronic ulcer of other part of left foot limited to breakdown of skin ([place]) 11/12/2016   Osteomyelitis (HCC)    left 5th metatarsal  PAD (peripheral artery disease) ([place])    Distal aortogram June 2012. Atherectomy left popliteal artery July 2012.    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 Before age 65   Hyperlipidemia Brother    Hypertension Brother    Cancer Sister        Breast   Hyperlipidemia Sister    Hypertension Sister    Hypertension Son    Arthritis Other        GRANDMOTHER   Hypertension Other        OTHER FAMILY MEMBERS    Past Surgical History:  Procedure Laterality Date   AMPUTATION  11/05/2011   Procedure: AMPUTATION RAY;  Surgeon: Wylene Simmer, MD;  Location: [facility];  Service: Orthopedics;  Laterality: Right;  Amputation of Right 4&5th Toes    AMPUTATION Left 11/26/2012   Procedure: AMPUTATION RAY;  Surgeon: Wylene Simmer, MD;  Location: [facility];  Service: Orthopedics;  Laterality: Left;  fourth ray amputation   AMPUTATION Right 08/27/2014   Procedure: Transmetatarsal Amputation;  Surgeon: Newt Minion, MD;  Location: [facility];  Service: Orthopedics;  Laterality: Right;   AMPUTATION Right 01/14/2015   Procedure: AMPUTATION BELOW KNEE;  Surgeon: Newt Minion, MD;  Location: [facility];  Service: Orthopedics;  Laterality: Right;   AMPUTATION Left 10/21/2015   Procedure: Left Foot 5th Ray Amputation;  Surgeon: Newt Minion, MD;  Location: [facility];  Service: Orthopedics;  Laterality: Left;   ANTERIOR FUSION CERVICAL SPINE  02/06/06   C4-5, C5-6, C6-7; SURGEON DR. MAX COHEN   AV FISTULA PLACEMENT Left 06/02/2020   Procedure: ARTERIOVENOUS (AV) FISTULA CREATION LEFT;  Surgeon: Waynetta Sandy, MD;  Location: [facility];  Service: Vascular;  Laterality: Left;   BACK SURGERY     x 3   [place] Left 07/21/2020   Procedure: LEFT UPPER ARM ATERIOVENOUS SUPERFISTULALIZATION;  Surgeon: Waynetta Sandy, MD;  Location: [facility];  Service: Vascular;  Laterality: Left;   BELOW KNEE LEG AMPUTATION Right    CARDIAC CATHETERIZATION  10/31/04   2009   CAROTID ENDARTERECTOMY  11/10/10   CAROTID ENDARTERECTOMY Left 11/10/2010   Subtotal occlusion of left internal carotid artery with left hemispheric transient ischemic attacks.   CAROTID STENT     CARPAL TUNNEL RELEASE Right 10/21/2013   Procedure: RIGHT CARPAL TUNNEL RELEASE;  Surgeon: Wynonia Sours, MD;  Location: [facility];  Service: Orthopedics;  Laterality: Right;   CHOLECYSTECTOMY     COLON SURGERY     COLONOSCOPY     COLOSTOMY REVERSAL  05/21/2018   ileostomy reversal   CYSTOSCOPY WITH STENT PLACEMENT Bilateral 01/13/2018  Procedure: CYSTOSCOPY WITH BILATERAL URETERAL CATHETER PLACEMENT;  Surgeon: Ardis Hughs, MD;  Location: [facility];  Service: Urology;   Laterality: Bilateral;   ESOPHAGEAL MANOMETRY Bilateral 07/19/2014   Procedure: ESOPHAGEAL MANOMETRY (EM);  Surgeon: Jerene Bears, MD;  [facility];  Service: Gastroenterology;  Laterality: Bilateral;   EYE SURGERY Bilateral 2020   cataract   FEMORAL ARTERY STENT     x6   FINGER SURGERY     FOOT SURGERY  04/25/2016    EXCISION BASE 5TH METATARSAL AND PARTIAL CUBOID LEFT FOOT   HERNIA REPAIR     LEFT INGUINAL AND UMBILICAL REPAIRS   HERNIA REPAIR     I & D EXTREMITY Left 04/25/2016   Procedure: EXCISION BASE 5TH METATARSAL AND PARTIAL CUBOID LEFT FOOT;  Surgeon: Newt Minion, MD;  Location: [facility];  Service: Orthopedics;  Laterality: Left;   ILEOSTOMY  01/13/2018   Procedure: ILEOSTOMY;  Surgeon: Clovis Riley, MD;  Location: [facility];  Service: General;;   ILEOSTOMY CLOSURE N/A 05/21/2018   Procedure: ILEOSTOMY REVERSAL ERAS PATHWAY;  Surgeon: Clovis Riley, MD;  Location: [facility];  Service: General;  Laterality: N/A;   IR RADIOLOGIST EVAL & MGMT  11/19/2017   IR RADIOLOGIST EVAL & MGMT  12/03/2017   IR RADIOLOGIST EVAL & MGMT  12/18/2017   JOINT REPLACEMENT Right 2001   Total knee   LAMINECTOMY     X 3 LUMBAR AND X 2 CERVICAL SPINE OPERATIONS   LAPAROSCOPIC CHOLECYSTECTOMY W/ CHOLANGIOGRAPHY  11/09/04   SURGEON DR. Meadow CATH AND CORONARY ANGIOGRAPHY N/A 09/16/2018   Procedure: LEFT HEART CATH AND CORONARY ANGIOGRAPHY;  Surgeon: Nigel Mormon, MD;  Location: [facility];  Service: Cardiovascular;  Laterality: N/A;   LEFT HEART CATHETERIZATION WITH CORONARY ANGIOGRAM N/A 10/29/2014   Procedure: LEFT HEART CATHETERIZATION WITH CORONARY ANGIOGRAM;  Surgeon: Laverda Page, MD;  Location: [facility];  Service: Cardiovascular;  Laterality: N/A;   LIGATION OF COMPETING BRANCHES OF ARTERIOVENOUS FISTULA Left 07/21/2020   Procedure: LIGATION OF COMPETING BRANCHES OF LEFT UPPER ARM ARTERIOVENOUS FISTULA;  Surgeon: Waynetta Sandy, MD;   Location: [facility];  Service: Vascular;  Laterality: Left;   LOWER EXTREMITY ANGIOGRAM N/A 03/19/2012   Procedure: LOWER EXTREMITY ANGIOGRAM;  Surgeon: Burnell Blanks, MD;  Location: [facility] LAB;  Service: Cardiovascular;  Laterality: N/A;   NECK SURGERY     PARTIAL COLECTOMY N/A 01/13/2018   Procedure: LAPAROSCOPIC ASSISTED   SIGMOID COLECTOMY ILEOSTOMY;  Surgeon: Clovis Riley, MD;  Location[facility];  Service: General;  Laterality: N/A;   PENILE PROSTHESIS IMPLANT  08/14/05   INFRAPUBIC INSERTION OF INFLATABLE PENILE PROSTHESIS; SURGEON DR. Amalia Hailey   PENILE PROSTHESIS IMPLANT     PERCUTANEOUS CORONARY STENT INTERVENTION (PCI-S) Right 10/29/2014   Procedure: PERCUTANEOUS CORONARY STENT INTERVENTION (PCI-S);  Surgeon: Laverda Page, MD;  Location[facility] LAB;  Service: Cardiovascular;  Laterality: Right;   SHOULDER ARTHROSCOPY     SPINE SURGERY     TOE AMPUTATION Left    TONSILLECTOMY     TOTAL KNEE ARTHROPLASTY  07/2002   RIGHT KNEE ; SURGEON  DR. [name] ALSO HAD ARTHROSCOPIC RIGHT KNEE IN  10/2001   TOTAL KNEE ARTHROPLASTY     ULNAR NERVE TRANSPOSITION Right 10/21/2013   Procedure: RIGHT ELBOW  ULNAR NERVE DECOMPRESSION;  Surgeon: Wynonia Sours, MD;  Location: [facility];  Service: Orthopedics;  Laterality: Right;  Social History   Occupational History   Occupation: Magazine features editor: UNEMPLOYED  Tobacco Use   Smoking status: Former    Packs/day: 2.00    Years: 35.00    Pack years: 70.00    Types: Cigarettes    Quit date: 10/28/2011    Years since quitting: 9.3   Smokeless tobacco: Never  Vaping Use   Vaping Use: Some days   Substances: Nicotine  Substance and Sexual Activity   Alcohol use: Not Currently    Comment: "not in a long time"   Drug use: Never   Sexual activity: Yes    Birth control/protection: Implant    Comment: penile implant

## 2021-03-17 ENCOUNTER — Other Ambulatory Visit: Payer: Self-pay

## 2021-03-17 ENCOUNTER — Encounter: Payer: Self-pay | Admitting: Physician Assistant

## 2021-03-17 ENCOUNTER — Ambulatory Visit (INDEPENDENT_AMBULATORY_CARE_PROVIDER_SITE_OTHER): Payer: 59 | Admitting: Physician Assistant

## 2021-03-17 DIAGNOSIS — L97929 Non-pressure chronic ulcer of unspecified part of left lower leg with unspecified severity: Secondary | ICD-10-CM | POA: Diagnosis not present

## 2021-03-17 DIAGNOSIS — I87332 Chronic venous hypertension (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: Secondary | ICD-10-CM | POA: Diagnosis not present

## 2021-03-17 NOTE — Progress Notes (Signed)
Office Visit Note   Patient: [name]           Date of Birth: 07-22-1956           MRN: [id number] Visit Date: 03/17/2021              Requested by: [name], PA-C [address] [place],  [place] [address] PCP: [name], PA-C  Chief Complaint  Patient presents with   Left Leg - Follow-up      HPI: Presents today for his chronic venous insufficiency of his left lower extremity.  He comes in today for a change from his compression wrap.  He has no complaints  Assessment & Plan: Visit Diagnoses: No diagnosis found.  Plan: Significant improvement in swelling.  Just a very small area of ulceration over the lateral malleolus.  We will follow-up next week I have told him to bring his compression socks with him because I think most likely we could transition to these  Follow-Up Instructions: No follow-ups on file.   Ortho Exam  Patient is alert, oriented, no adenopathy, well-dressed, normal affect, normal respiratory effort. Left lower extremity excellent wrinkling of the skin.  No erythema no cellulitis no open ulcers on the leg.  He has a small 1 x 2 mm ulcer that does not probe to bone is approximately 1 mm deep with fibrinous tissue at the base on the lateral ankle.  This was debrided to healthy bleeding tissue.  No signs of cellulitis.  New wrap applied today  Imaging: No results found. No images are attached to the encounter.  Labs: Lab Results  Component Value Date   HGBA1C 5.7 (H) 09/16/2018   HGBA1C 7.7 (H) 05/15/2018   HGBA1C 7.1 (H) 01/07/2018   ESRSEDRATE 35 (H) 09/03/2016   ESRSEDRATE 111 (H) 05/11/2016   ESRSEDRATE 98 (H) 05/04/2016   CRP 1.2 (H) 09/03/2016   CRP 4.7 (H) 05/11/2016   CRP 8.0 (H) 05/04/2016   REPTSTATUS 11/15/2017 FINAL 11/10/2017   GRAMSTAIN  11/04/2017    ABUNDANT WBC PRESENT, PREDOMINANTLY PMN MODERATE GRAM NEGATIVE RODS MODERATE GRAM POSITIVE COCCI Performed at [facility], [place] [address] [place], [place]  [address]    CULT  11/10/2017    NO GROWTH 5 DAYS Performed at [facility] [address], [place], [place] [address]    LABORGA STAPHYLOCOCCUS AUREUS 09/26/2014     Lab Results  Component Value Date   ALBUMIN 3.7 (L) 05/30/2020   ALBUMIN 2.9 (L) 06/19/2019   ALBUMIN 3.5 05/15/2018   PREALBUMIN 19.0 05/04/2016   PREALBUMIN 25.0 12/16/2015    Lab Results  Component Value Date   MG 1.9 05/24/2018   MG 2.1 05/23/2018   MG 1.3 (L) 05/22/2018   No results found for: VD25OH  Lab Results  Component Value Date   PREALBUMIN 19.0 05/04/2016   PREALBUMIN 25.0 12/16/2015   CBC EXTENDED Latest Ref Rng & Units 11/18/2020 10/17/2020 10/03/2020  WBC 4.0 - 10.5 K/uL 7.7 - -  RBC 4.22 - 5.81 MIL/uL 3.21(L) - -  HGB 13.0 - 17.0 g/dL 9.1(L) 9.8(L) 9.5(L)  HCT 39.0 - 52.0 % 28.7(L) - -  PLT 150 - 400 K/uL 149(L) - -  NEUTROABS 1.7 - 7.7 K/uL - - -  LYMPHSABS 0.7 - 4.0 K/uL - - -     There is no height or weight on file to calculate BMI.  Orders:  No orders of the defined types were placed in this  encounter.  No orders of the defined types were placed in this encounter.    Procedures: No procedures performed  Clinical Data: No additional findings.  ROS:  All other systems negative, except as noted in the HPI. Review of Systems  Objective: Vital Signs: There were no vitals taken for this visit.  Specialty Comments:  No specialty comments available.  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   Pseudoclaudication 11/15/2018   Essential  hypertension 09/18/2018   S/P BKA (below knee amputation), right ([place]) 09/18/2018   Amputation of toe of left foot ([place]) 09/18/2018   Peripheral artery disease ([place]) 09/18/2018   S/P carotid endarterectomy 09/18/2018   OSA on CPAP 09/18/2018   Chronic back pain 09/18/2018   Status post reversal of ileostomy 05/21/2018   Normocytic anemia 02/14/2018   Intra-abdominal abscess ([place]) 11/04/2017   Chronic venous hypertension (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 ([place]) 12/11/2016   Unilateral primary osteoarthritis, left knee 10/11/2016   History of right below knee amputation ([place]) 07/12/2016   Diabetic foot infection ([place]) 05/04/2016   Type 2 diabetes mellitus with insulin therapy ([place]) 12/16/2015   Amputated toe ([place]) 10/21/2015   Leg edema, left 09/03/2015   CAD (dz of distal, mid and proximal RCA with implantation of 3 overlapping drug-eluting stent,) 09/03/2015   Chronic diastolic heart failure, NYHA class 2 ([place]) 09/03/2015   Angina pectoris associated with type 2 diabetes mellitus ([place]) 10/28/2014   Hyperlipidemia 08/25/2014   Limb pain 03/20/2013   DJD (degenerative joint disease) 09/25/2012   Migraine 09/25/2012   Neuropathy 09/25/2012   Restless legs syndrome (RLS) 09/25/2012   Chronic obstructive pulmonary disease, unspecified ([place]) 04/25/2012   Unknown cause of morbidity or mortality 04/25/2012   Chronic total occlusion of artery of the extremities ([place]) 04/08/2012   Onychomycosis 02/01/2012   Occlusion and stenosis of carotid artery without mention of cerebral infarction 06/12/2011   GERD (gastroesophageal reflux disease) 05/08/2011   Barrett's esophagus without dysplasia 05/08/2011   Former tobacco use 02/01/2011   Past Medical History:  Diagnosis Date   Carotid artery occlusion 11/10/10   LEFT CAROTID ENDARTERECTOMY   Chronic kidney disease    Complication of anesthesia    BP WENT UP AT [facility] "   COPD (chronic obstructive pulmonary disease)  ([place])    pt denies this dx as of 06/01/20 - no inhaler    Diabetes mellitus without complication ([place])    Diverticulitis    Diverticulosis of colon (without mention of hemorrhage)    DJD (degenerative joint disease)    knees/hands/feet/back/neck   Fatty liver    Full dentures    GERD (gastroesophageal reflux disease)    H/O hiatal hernia    History of blood transfusion    with a past surical procedure per patient 06/01/20   Hyperlipidemia    Hypertension    Neuromuscular disorder ([place])    peripheral neuropathy   Non-pressure chronic ulcer of other part of left foot limited to breakdown of skin ([place]) 11/12/2016   Osteomyelitis ([facility])    left 5th metatarsal   PAD (peripheral artery disease) (  [place])    Distal aortogram June 2012. Atherectomy left popliteal artery July 2012.    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 Before age 38   Hyperlipidemia Brother    Hypertension Brother    Cancer Sister        Breast   Hyperlipidemia Sister    Hypertension Sister    Hypertension Son    Arthritis Other        GRANDMOTHER   Hypertension Other        OTHER FAMILY MEMBERS    Past Surgical History:  Procedure Laterality Date   AMPUTATION  11/05/2011   Procedure: AMPUTATION RAY;  Surgeon: Wylene Simmer, MD;  Location: [facility];  Service: Orthopedics;  Laterality: Right;  Amputation of Right 4&5th Toes   AMPUTATION Left 11/26/2012   Procedure: AMPUTATION  RAY;  Surgeon: Wylene Simmer, MD;  Location: [facility];  Service: Orthopedics;  Laterality: Left;  fourth ray amputation   AMPUTATION Right 08/27/2014   Procedure: Transmetatarsal Amputation;  Surgeon: Newt Minion, MD;  Location: [facility];  Service: Orthopedics;  Laterality: Right;   AMPUTATION Right 01/14/2015   Procedure: AMPUTATION BELOW KNEE;  Surgeon: Newt Minion, MD;  Location: [facility];  Service: Orthopedics;  Laterality: Right;   AMPUTATION Left 10/21/2015   Procedure: Left Foot 5th Ray Amputation;  Surgeon: Newt Minion, MD;  Location: [facility];  Service: Orthopedics;  Laterality: Left;   ANTERIOR FUSION CERVICAL SPINE  02/06/06   C4-5, C5-6, C6-7; SURGEON DR. MAX COHEN   AV FISTULA PLACEMENT Left 06/02/2020   Procedure: ARTERIOVENOUS (AV) FISTULA CREATION LEFT;  Surgeon: Waynetta Sandy, MD;  Location: [facility];  Service: Vascular;  Laterality: Left;   BACK SURGERY     x 3   [place] Left 07/21/2020   Procedure: LEFT UPPER ARM ATERIOVENOUS SUPERFISTULALIZATION;  Surgeon: Waynetta Sandy, MD;  Location: [facility];  Service: Vascular;  Laterality: Left;   BELOW KNEE LEG AMPUTATION Right    CARDIAC CATHETERIZATION  10/31/04   2009   CAROTID ENDARTERECTOMY  11/10/10   CAROTID ENDARTERECTOMY Left 11/10/2010   Subtotal occlusion of left internal carotid artery with left hemispheric transient ischemic attacks.   CAROTID STENT     CARPAL TUNNEL RELEASE Right 10/21/2013   Procedure: RIGHT CARPAL TUNNEL RELEASE;  Surgeon: Wynonia Sours, MD;  Location: [facility];  Service: Orthopedics;  Laterality: Right;   CHOLECYSTECTOMY     COLON SURGERY     COLONOSCOPY     COLOSTOMY REVERSAL  05/21/2018   ileostomy reversal   CYSTOSCOPY WITH STENT PLACEMENT Bilateral 01/13/2018   Procedure: CYSTOSCOPY WITH BILATERAL  URETERAL CATHETER PLACEMENT;  Surgeon: Ardis Hughs, MD;  Location[facility];  Service: Urology;  Laterality: Bilateral;   ESOPHAGEAL MANOMETRY Bilateral  07/19/2014   Procedure: ESOPHAGEAL MANOMETRY (EM);  Surgeon: Jerene Bears, MD;  [facility];  Service: Gastroenterology;  Laterality: Bilateral;   EYE SURGERY Bilateral 2020   cataract   FEMORAL ARTERY STENT     x6   FINGER SURGERY     FOOT SURGERY  04/25/2016    EXCISION BASE 5TH METATARSAL AND PARTIAL CUBOID LEFT FOOT   HERNIA REPAIR     LEFT INGUINAL AND UMBILICAL REPAIRS   HERNIA REPAIR     I & D EXTREMITY Left 04/25/2016   Procedure: EXCISION BASE 5TH METATARSAL AND PARTIAL CUBOID LEFT FOOT;  Surgeon: Newt Minion, MD;  Location: [facility];  Service: Orthopedics;  Laterality: Left;   ILEOSTOMY  01/13/2018   Procedure: ILEOSTOMY;  Surgeon: Clovis Riley, MD;  Location[facility];  Service: General;;   ILEOSTOMY CLOSURE N/A 05/21/2018   Procedure: ILEOSTOMY REVERSAL ERAS PATHWAY;  Surgeon: Clovis Riley, MD;  Location: [facility];  Service: General;  Laterality: N/A;   IR RADIOLOGIST EVAL & MGMT  11/19/2017   IR RADIOLOGIST EVAL & MGMT  12/03/2017   IR RADIOLOGIST EVAL & MGMT  12/18/2017   JOINT REPLACEMENT Right 2001   Total knee   LAMINECTOMY     X 3 LUMBAR AND X 2 CERVICAL SPINE OPERATIONS   LAPAROSCOPIC CHOLECYSTECTOMY W/ CHOLANGIOGRAPHY  11/09/04   SURGEON DR. Ardencroft CATH AND CORONARY ANGIOGRAPHY N/A 09/16/2018   Procedure: LEFT HEART CATH AND CORONARY ANGIOGRAPHY;  Surgeon: Nigel Mormon, MD;  Location: [facility];  Service: Cardiovascular;  Laterality: N/A;   LEFT HEART CATHETERIZATION WITH CORONARY ANGIOGRAM N/A 10/29/2014   Procedure: LEFT HEART CATHETERIZATION WITH CORONARY ANGIOGRAM;  Surgeon: Laverda Page, MD;  Location[facility];  Service: Cardiovascular;  Laterality: N/A;   LIGATION OF COMPETING BRANCHES OF ARTERIOVENOUS FISTULA Left 07/21/2020   Procedure: LIGATION OF COMPETING BRANCHES OF LEFT UPPER ARM ARTERIOVENOUS FISTULA;  Surgeon: Waynetta Sandy, MD;  Location: [facility];  Service: Vascular;  Laterality: Left;    LOWER EXTREMITY ANGIOGRAM N/A 03/19/2012   Procedure: LOWER EXTREMITY ANGIOGRAM;  Surgeon: Burnell Blanks, MD;  Location[facility];  Service: Cardiovascular;  Laterality: N/A;   NECK SURGERY     PARTIAL COLECTOMY N/A 01/13/2018   Procedure: LAPAROSCOPIC ASSISTED   SIGMOID COLECTOMY ILEOSTOMY;  Surgeon: Clovis Riley, MD;  Location[facility];  Service: General;  Laterality: N/A;   PENILE PROSTHESIS IMPLANT  08/14/05   INFRAPUBIC INSERTION OF INFLATABLE PENILE PROSTHESIS; SURGEON DR. Amalia Hailey   PENILE PROSTHESIS IMPLANT     PERCUTANEOUS CORONARY STENT INTERVENTION (PCI-S) Right 10/29/2014   Procedure: PERCUTANEOUS CORONARY STENT INTERVENTION (PCI-S);  Surgeon: Laverda Page, MD;  Location: [facility]ATH LAB;  Service: Cardiovascular;  Laterality: Right;   SHOULDER ARTHROSCOPY     SPINE SURGERY     TOE AMPUTATION Left    TONSILLECTOMY     TOTAL KNEE ARTHROPLASTY  07/2002   RIGHT KNEE ; SURGEON  DR. [name] ALSO HAD ARTHROSCOPIC RIGHT KNEE IN  10/2001   TOTAL KNEE ARTHROPLASTY     ULNAR NERVE TRANSPOSITION Right 10/21/2013   Procedure: RIGHT ELBOW  ULNAR NERVE DECOMPRESSION;  Surgeon: Wynonia Sours, MD;  Location: [facility];  Service: Orthopedics;  Laterality: Right;   Social History  Occupational History   Occupation: Magazine features editor: UNEMPLOYED  Tobacco Use   Smoking status: Former    Packs/day: 2.00    Years: 35.00    Pack years: 70.00    Types: Cigarettes    Quit date: 10/28/2011    Years since quitting: 9.3   Smokeless tobacco: Never  Vaping Use   Vaping Use: Some days   Substances: Nicotine  Substance and Sexual Activity   Alcohol use: Not Currently    Comment: "not in a long time"   Drug use: Never   Sexual activity: Yes    Birth control/protection: Implant    Comment: penile implant

## 2021-03-20 ENCOUNTER — Encounter (INDEPENDENT_AMBULATORY_CARE_PROVIDER_SITE_OTHER): Payer: Self-pay | Admitting: Ophthalmology

## 2021-03-20 ENCOUNTER — Ambulatory Visit: Payer: 59 | Admitting: Physician Assistant

## 2021-03-20 ENCOUNTER — Encounter: Payer: Self-pay | Admitting: Physician Assistant

## 2021-03-20 ENCOUNTER — Other Ambulatory Visit: Payer: Self-pay

## 2021-03-20 ENCOUNTER — Ambulatory Visit (INDEPENDENT_AMBULATORY_CARE_PROVIDER_SITE_OTHER): Payer: 59 | Admitting: Ophthalmology

## 2021-03-20 ENCOUNTER — Ambulatory Visit (INDEPENDENT_AMBULATORY_CARE_PROVIDER_SITE_OTHER): Payer: 59 | Admitting: Physician Assistant

## 2021-03-20 VITALS — Ht 74.0 in | Wt 272.0 lb

## 2021-03-20 DIAGNOSIS — E113411 Type 2 diabetes mellitus with severe nonproliferative diabetic retinopathy with macular edema, right eye: Secondary | ICD-10-CM | POA: Diagnosis not present

## 2021-03-20 DIAGNOSIS — E113412 Type 2 diabetes mellitus with severe nonproliferative diabetic retinopathy with macular edema, left eye: Secondary | ICD-10-CM | POA: Diagnosis not present

## 2021-03-20 DIAGNOSIS — I87332 Chronic venous hypertension (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: Secondary | ICD-10-CM

## 2021-03-20 DIAGNOSIS — L97929 Non-pressure chronic ulcer of unspecified part of left lower leg with unspecified severity: Secondary | ICD-10-CM

## 2021-03-20 MED ORDER — AFLIBERCEPT 2MG/0.05ML IZ SOLN FOR KALEIDOSCOPE
2.0000 mg | INTRAVITREAL | Status: AC | PRN
  Administered 2021-03-20: 2 mg via INTRAVITREAL

## 2021-03-20 NOTE — Progress Notes (Signed)
Office Visit Note   Patient: [name]           Date of Birth: Mar 10, 1956           MRN: [id number] Visit Date: 03/20/2021              Requested by: [name], PA-C [id number] [place],  [place] [address] PCP: [name], PA-C  Chief Complaint  Patient presents with   Left Leg - Follow-up    3 day follow up on his left leg edema      HPI: Patient is a 65 year old gentleman who comes in for left leg swelling.  We have been Dynaflex wrapping him.  He actually has significant improvement.  He does get some fluid fluctuations based on his dialysis.  Assessment & Plan: Visit Diagnoses: No diagnosis found.  Plan: Patient will go into a Dynaflex wrap today.  I think at this point he could go into his sock but he is concerned about doing this because last time this happened in the ankle wound was not completely healed it opened up again.  Follow-Up Instructions: No follow-ups on file.   Ortho Exam  Patient is alert, oriented, no adenopathy, well-dressed, normal affect, normal respiratory effort. Examination of his ankle demonstrates small 1 x 2 mm open ulceration.  No surrounding cellulitis no ascending cellulitis minimal drainage had fibrinous tissue which was debrided to healthy bleeding tissue does not probe deeply or tunnel  Imaging: No results found. No images are attached to the encounter.  Labs: Lab Results  Component Value Date   HGBA1C 5.7 (H) 09/16/2018   HGBA1C 7.7 (H) 05/15/2018   HGBA1C 7.1 (H) 01/07/2018   ESRSEDRATE 35 (H) 09/03/2016   ESRSEDRATE 111 (H) 05/11/2016   ESRSEDRATE 98 (H) 05/04/2016   CRP 1.2 (H) 09/03/2016   CRP 4.7 (H) 05/11/2016   CRP 8.0 (H) 05/04/2016   REPTSTATUS 11/15/2017 FINAL 11/10/2017   GRAMSTAIN  11/04/2017    ABUNDANT WBC PRESENT, PREDOMINANTLY PMN MODERATE GRAM NEGATIVE RODS MODERATE GRAM POSITIVE COCCI Performed at [facility], [place] [address], [place], [place] [address]    CULT  11/10/2017    NO  GROWTH 5 DAYS Performed at [facility] [address] [place], [place] [address]    LABORGA STAPHYLOCOCCUS AUREUS 09/26/2014     Lab Results  Component Value Date   ALBUMIN 3.7 (L) 05/30/2020   ALBUMIN 2.9 (L) 06/19/2019   ALBUMIN 3.5 05/15/2018   PREALBUMIN 19.0 05/04/2016   PREALBUMIN 25.0 12/16/2015    Lab Results  Component Value Date   MG 1.9 05/24/2018   MG 2.1 05/23/2018   MG 1.3 (L) 05/22/2018   No results found for: VD25OH  Lab Results  Component Value Date   PREALBUMIN 19.0 05/04/2016   PREALBUMIN 25.0 12/16/2015   CBC EXTENDED Latest Ref Rng & Units 11/18/2020 10/17/2020 10/03/2020  WBC 4.0 - 10.5 K/uL 7.7 - -  RBC 4.22 - 5.81 MIL/uL 3.21(L) - -  HGB 13.0 - 17.0 g/dL 9.1(L) 9.8(L) 9.5(L)  HCT 39.0 - 52.0 % 28.7(L) - -  PLT 150 - 400 K/uL 149(L) - -  NEUTROABS 1.7 - 7.7 K/uL - - -  LYMPHSABS 0.7 - 4.0 K/uL - - -     Body mass index is 34.92 kg/m.  Orders:  No orders of the defined types were placed in this encounter.  No orders of the defined types were placed in this encounter.  Procedures: No procedures performed  Clinical Data: No additional findings.  ROS:  All other systems negative, except as noted in the HPI. Review of Systems  Objective: Vital Signs: Ht 6\' 2"  (1.88 m)   Wt 272 lb (123.4 kg)   BMI 34.92 kg/m   Specialty Comments:  No specialty comments available.  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   Pseudoclaudication 11/15/2018   Essential hypertension 09/18/2018   S/P BKA (below  knee amputation), right ([place]) 09/18/2018   Amputation of toe of left foot ([place]) 09/18/2018   Peripheral artery disease ([place]) 09/18/2018   S/P carotid endarterectomy 09/18/2018   OSA on CPAP 09/18/2018   Chronic back pain 09/18/2018   Status post reversal of ileostomy 05/21/2018   Normocytic anemia 02/14/2018   Intra-abdominal abscess ([place]) 11/04/2017   Chronic venous hypertension (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 ([place]) 12/11/2016   Unilateral primary osteoarthritis, left knee 10/11/2016   History of right below knee amputation ([place]) 07/12/2016   Diabetic foot infection ([place]) 05/04/2016   Type 2 diabetes mellitus with insulin therapy ([place]) 12/16/2015   Amputated toe ([place]) 10/21/2015   Leg edema, left 09/03/2015   CAD (dz of distal, mid and proximal RCA with implantation of 3 overlapping drug-eluting stent,) 09/03/2015   Chronic diastolic heart failure, NYHA class 2 ([place]) 09/03/2015   Angina pectoris associated with type 2 diabetes mellitus ([place]) 10/28/2014   Hyperlipidemia 08/25/2014   Limb pain 03/20/2013   DJD (degenerative joint disease) 09/25/2012   Migraine 09/25/2012   Neuropathy 09/25/2012   Restless legs syndrome (RLS) 09/25/2012   Chronic obstructive pulmonary disease, unspecified ([place]) 04/25/2012   Unknown cause of morbidity or mortality 04/25/2012   Chronic total occlusion of artery of the extremities ([place]) 04/08/2012   Onychomycosis 02/01/2012   Occlusion and stenosis of carotid artery without mention of cerebral infarction 06/12/2011   GERD (gastroesophageal reflux disease) 05/08/2011   Barrett's esophagus without dysplasia 05/08/2011   Former tobacco use 02/01/2011   Past Medical History:  Diagnosis Date   Carotid artery occlusion 11/10/10   LEFT CAROTID ENDARTERECTOMY   Chronic kidney disease    Complication of anesthesia    BP WENT UP AT [facility] "   COPD (chronic obstructive pulmonary disease) ([place])    pt denies this dx as of 06/01/20  - no inhaler    Diabetes mellitus without complication ([place])    Diverticulitis    Diverticulosis of colon (without mention of hemorrhage)    DJD (degenerative joint disease)    knees/hands/feet/back/neck   Fatty liver    Full dentures    GERD (gastroesophageal reflux disease)    H/O hiatal hernia    History of blood transfusion    with a past surical procedure per patient 06/01/20   Hyperlipidemia    Hypertension    Neuromuscular disorder ([place])    peripheral neuropathy   Non-pressure chronic ulcer of other part of left foot limited to breakdown of skin ([place]) 11/12/2016   Osteomyelitis (HCC)    left 5th metatarsal   PAD (peripheral artery disease) (HCC)    Distal aortogram  June 2012. Atherectomy left popliteal artery July 2012.    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 Before age 19   Hyperlipidemia Brother    Hypertension Brother    Cancer Sister        Breast   Hyperlipidemia Sister    Hypertension Sister    Hypertension Son    Arthritis Other        GRANDMOTHER   Hypertension Other        OTHER FAMILY MEMBERS    Past Surgical History:  Procedure Laterality Date   AMPUTATION  11/05/2011   Procedure: AMPUTATION RAY;  Surgeon: Wylene Simmer, MD;  Location: [facility];  Service: Orthopedics;  Laterality: Right;  Amputation of Right 4&5th Toes   AMPUTATION Left 11/26/2012   Procedure: AMPUTATION RAY;  Surgeon: Wylene Simmer, MD;  Location:  [facility];  Service: Orthopedics;  Laterality: Left;  fourth ray amputation   AMPUTATION Right 08/27/2014   Procedure: Transmetatarsal Amputation;  Surgeon: Newt Minion, MD;  Location: [facility];  Service: Orthopedics;  Laterality: Right;   AMPUTATION Right 01/14/2015   Procedure: AMPUTATION BELOW KNEE;  Surgeon: Newt Minion, MD;  Location: [facility];  Service: Orthopedics;  Laterality: Right;   AMPUTATION Left 10/21/2015   Procedure: Left Foot 5th Ray Amputation;  Surgeon: Newt Minion, MD;  Location: [facility];  Service: Orthopedics;  Laterality: Left;   ANTERIOR FUSION CERVICAL SPINE  02/06/06   C4-5, C5-6, C6-7; SURGEON DR. MAX COHEN   AV FISTULA PLACEMENT Left 06/02/2020   Procedure: ARTERIOVENOUS (AV) FISTULA CREATION LEFT;  Surgeon: Waynetta Sandy, MD;  Location: [facility];  Service: Vascular;  Laterality: Left;   BACK SURGERY     x 3   [place] Left 07/21/2020   Procedure: LEFT UPPER ARM ATERIOVENOUS SUPERFISTULALIZATION;  Surgeon: Waynetta Sandy, MD;  Location: [facility];  Service: Vascular;  Laterality: Left;   BELOW KNEE LEG AMPUTATION Right    CARDIAC CATHETERIZATION  10/31/04   2009   CAROTID ENDARTERECTOMY  11/10/10   CAROTID ENDARTERECTOMY Left 11/10/2010   Subtotal occlusion of left internal carotid artery with left hemispheric transient ischemic attacks.   CAROTID STENT     CARPAL TUNNEL RELEASE Right 10/21/2013   Procedure: RIGHT CARPAL TUNNEL RELEASE;  Surgeon: Wynonia Sours, MD;  Location: [facility];  Service: Orthopedics;  Laterality: Right;   CHOLECYSTECTOMY     COLON SURGERY     COLONOSCOPY     COLOSTOMY REVERSAL  05/21/2018   ileostomy reversal   CYSTOSCOPY WITH STENT PLACEMENT Bilateral 01/13/2018   Procedure: CYSTOSCOPY WITH BILATERAL URETERAL CATHETER PLACEMENT;  Surgeon: Louis Meckel,  Viona Gilmore, MD;  Location: [facility];  Service: Urology;  Laterality: Bilateral;   ESOPHAGEAL MANOMETRY Bilateral 07/19/2014   Procedure: ESOPHAGEAL MANOMETRY  (EM);  Surgeon: Jerene Bears, MD;  Location[facility];  Service: Gastroenterology;  Laterality: Bilateral;   EYE SURGERY Bilateral 2020   cataract   FEMORAL ARTERY STENT     x6   FINGER SURGERY     FOOT SURGERY  04/25/2016    EXCISION BASE 5TH METATARSAL AND PARTIAL CUBOID LEFT FOOT   HERNIA REPAIR     LEFT INGUINAL AND UMBILICAL REPAIRS   HERNIA REPAIR     I & D EXTREMITY Left 04/25/2016   Procedure: EXCISION BASE 5TH METATARSAL AND PARTIAL CUBOID LEFT FOOT;  Surgeon: Newt Minion, MD;  Location: [facility];  Service: Orthopedics;  Laterality: Left;   ILEOSTOMY  01/13/2018   Procedure: ILEOSTOMY;  Surgeon: Clovis Riley, MD;  Location: [facility];  Service: General;;   ILEOSTOMY CLOSURE N/A 05/21/2018   Procedure: ILEOSTOMY REVERSAL ERAS PATHWAY;  Surgeon: Clovis Riley, MD;  Location: [facility];  Service: General;  Laterality: N/A;   IR RADIOLOGIST EVAL & MGMT  11/19/2017   IR RADIOLOGIST EVAL & MGMT  12/03/2017   IR RADIOLOGIST EVAL & MGMT  12/18/2017   JOINT REPLACEMENT Right 2001   Total knee   LAMINECTOMY     X 3 LUMBAR AND X 2 CERVICAL SPINE OPERATIONS   LAPAROSCOPIC CHOLECYSTECTOMY W/ CHOLANGIOGRAPHY  11/09/04   SURGEON DR. Walworth CATH AND CORONARY ANGIOGRAPHY N/A 09/16/2018   Procedure: LEFT HEART CATH AND CORONARY ANGIOGRAPHY;  Surgeon: Nigel Mormon, MD;  Location: [facility];  Service: Cardiovascular;  Laterality: N/A;   LEFT HEART CATHETERIZATION WITH CORONARY ANGIOGRAM N/A 10/29/2014   Procedure: LEFT HEART CATHETERIZATION WITH CORONARY ANGIOGRAM;  Surgeon: Laverda Page, MD;  Location: [facility];  Service: Cardiovascular;  Laterality: N/A;   LIGATION OF COMPETING BRANCHES OF ARTERIOVENOUS FISTULA Left 07/21/2020   Procedure: LIGATION OF COMPETING BRANCHES OF LEFT UPPER ARM ARTERIOVENOUS FISTULA;  Surgeon: Waynetta Sandy, MD;  Location: [facility];  Service: Vascular;  Laterality: Left;   LOWER EXTREMITY ANGIOGRAM N/A 03/19/2012    Procedure: LOWER EXTREMITY ANGIOGRAM;  Surgeon: Burnell Blanks, MD;  Location[facility];  Service: Cardiovascular;  Laterality: N/A;   NECK SURGERY     PARTIAL COLECTOMY N/A 01/13/2018   Procedure: LAPAROSCOPIC ASSISTED   SIGMOID COLECTOMY ILEOSTOMY;  Surgeon: Clovis Riley, MD;  Location[facility];  Service: General;  Laterality: N/A;   PENILE PROSTHESIS IMPLANT  08/14/05   INFRAPUBIC INSERTION OF INFLATABLE PENILE PROSTHESIS; SURGEON DR. Amalia Hailey   PENILE PROSTHESIS IMPLANT     PERCUTANEOUS CORONARY STENT INTERVENTION (PCI-S) Right 10/29/2014   Procedure: PERCUTANEOUS CORONARY STENT INTERVENTION (PCI-S);  Surgeon: Laverda Page, MD;  Location: [facility] CATH LAB;  Service: Cardiovascular;  Laterality: Right;   SHOULDER ARTHROSCOPY     SPINE SURGERY     TOE AMPUTATION Left    TONSILLECTOMY     TOTAL KNEE ARTHROPLASTY  07/2002   RIGHT KNEE ; SURGEON  DR. [name] ALSO HAD ARTHROSCOPIC RIGHT KNEE IN  10/2001   TOTAL KNEE ARTHROPLASTY     ULNAR NERVE TRANSPOSITION Right 10/21/2013   Procedure: RIGHT ELBOW  ULNAR NERVE DECOMPRESSION;  Surgeon: Wynonia Sours, MD;  Location: [facility];  Service: Orthopedics;  Laterality: Right;   Social History   Occupational History   Occupation: TRUCK  DRIVER    Employer: UNEMPLOYED  Tobacco Use   Smoking status: Former    Packs/day: 2.00    Years: 35.00    Pack years: 70.00    Types: Cigarettes    Quit date: 10/28/2011    Years since quitting: 9.4   Smokeless tobacco: Never  Vaping Use   Vaping Use: Some days   Substances: Nicotine  Substance and Sexual Activity   Alcohol use: Not Currently    Comment: "not in a long time"   Drug use: Never   Sexual activity: Yes    Birth control/protection: Implant    Comment: penile implant

## 2021-03-20 NOTE — Assessment & Plan Note (Signed)
No active macular edema left eye

## 2021-03-20 NOTE — Progress Notes (Signed)
03/20/2021     CHIEF COMPLAINT Patient presents for Retina Follow Up (9 week fu OD and Eylea OD/Pt states [facility] OU stable since last visit. Pt denies FOL, floaters, or ocular pain OU. Karie Mainland: 5.7/LBS: 130)   HISTORY OF PRESENT ILLNESS: [name] is a 65 y.o. male who presents to the clinic today for:   HPI     Retina Follow Up           Diagnosis: Diabetic Retinopathy   Laterality: right eye   Onset: 9 weeks ago   Severity: mild   Duration: 9 weeks   Course: stable   Comments: 9 week fu OD and Eylea OD Pt states VA OU stable since last visit. Pt denies FOL, floaters, or ocular pain OU.  A1C: 5.7 LBS: 130       Last edited by Kendra Opitz, COA on 03/20/2021  2:55 PM.      Referring physician: [name], PA-C [place],  [place] [address]  HISTORICAL INFORMATION:   Selected notes from the MEDICAL RECORD NUMBER    Lab Results  Component Value Date   HGBA1C 5.7 (H) 09/16/2018     CURRENT MEDICATIONS: Current Outpatient Medications (Ophthalmic Drugs)  Medication Sig   Polyvinyl Alcohol-Povidone PF 1.4-0.6 % SOLN Place 1 drop into both eyes 2 (two) times daily as needed (dry eyes).    No current facility-administered medications for this visit. (Ophthalmic Drugs)   Current Outpatient Medications (Other)  Medication Sig   amLODipine (NORVASC) 10 MG tablet Take 1 tablet (10 mg total) by mouth daily.   aspirin EC 81 MG tablet Take 81 mg by mouth daily.   atorvastatin (LIPITOR) 40 MG tablet Take 40 mg by mouth at bedtime.    BIDIL 20-37.5 MG tablet Take 1 tablet by mouth 3 (three) times daily.   carvedilol (COREG) 25 MG tablet Take 1.5 tablets (37.5 mg total) by mouth 2 (two) times daily.   clobetasol (TEMOVATE) 0.05 % external solution Apply 1 application topically daily as needed (dry scalp).   clopidogrel (PLAVIX) 75 MG tablet Take 75 mg by mouth daily.    fentaNYL (DURAGESIC - DOSED MCG/HR) 50 MCG/HR Place 50 mcg onto the skin every 3 (three)  days.   ferrous gluconate (FERGON) 324 MG tablet Take 324 mg by mouth 2 (two) times daily.   fluticasone (CUTIVATE) 0.05 % cream Apply 1 application topically 2 (two) times daily as needed (dry skin on face).    folic acid (FOLVITE) 1 MG tablet Take 1 mg by mouth daily.   Glucosamine HCl (GLUCOSAMINE PO) Take 100 tablets by mouth 2 (two) times daily.   HUMALOG KWIKPEN 100 UNIT/ML KiwkPen Inject 15-16 Units into the skin See admin instructions. Inject 13 units before breakfast, lunch, & dinner--   insulin glargine (LANTUS) 100 UNIT/ML injection Inject 55 Units into the skin at bedtime.   ketoconazole (NIZORAL) 2 % cream Apply 1 application topically 2 (two) times daily as needed for irritation.    linaclotide (LINZESS) 145 MCG CAPS capsule Take 145 mcg by mouth daily as needed (constipation).    loratadine (CLARITIN) 10 MG tablet Take 10 mg by mouth daily.   Multiple Vitamin (MULTIVITAMIN) tablet Take 1 tablet by mouth daily.   NEEDLE, REUSABLE, 22 G 22G X 1-1/2" MISC 1 Units by Does not apply route as directed. For B12 IM inj   nitroGLYCERIN (NITROSTAT) 0.4 MG SL tablet Place 0.4 mg under the tongue every 5 (five) minutes  as needed for chest pain.   omega-3 acid ethyl esters (LOVAZA) 1 g capsule Take 2 g by mouth 2 (two) times daily.   ondansetron (ZOFRAN) 4 MG tablet Take 4 mg by mouth every 6 (six) hours as needed for nausea or vomiting.   oxyCODONE-acetaminophen (PERCOCET) 7.5-325 MG tablet Take 1 tablet by mouth 3 (three) times daily as needed.   pantoprazole (PROTONIX) 40 MG tablet Take 40 mg by mouth daily.   pregabalin (LYRICA) 100 MG capsule Take 1 capsule (100 mg total) by mouth 3 (three) times daily.   rOPINIRole (REQUIP) 2 MG tablet Take 2 mg by mouth at bedtime.   Syringe/Needle, Disp, (SYRINGE 3CC/22GX1-1/2") 22G X 1-1/2" 3 ML MISC 1 Syringe by Does not apply route as directed. For b12 IM inj   tamsulosin (FLOMAX) 0.4 MG CAPS capsule Take 0.4 mg by mouth daily.   torsemide (DEMADEX)  20 MG tablet Take 20 mg by mouth in the morning and at bedtime. Take on extra for swelling   No current facility-administered medications for this visit. (Other)      REVIEW OF SYSTEMS:    ALLERGIES Allergies  Allergen Reactions   Contrast Media [Iodinated Diagnostic Agents]     Difficulty breathing     Ivp Dye [Iodinated Diagnostic Agents] Anaphylaxis and Other (See Comments)    Breathing problems, altered mental state    Adhesive [Tape] Rash    Rash after 3-4 days use    PAST MEDICAL HISTORY Past Medical History:  Diagnosis Date   Carotid artery occlusion 11/10/10   LEFT CAROTID ENDARTERECTOMY   Chronic kidney disease    Complication of anesthesia    BP WENT UP AT [facility] "   COPD (chronic obstructive pulmonary disease) ([place])    pt denies this dx as of 06/01/20 - no inhaler    Diabetes mellitus without complication ([place])    Diverticulitis    Diverticulosis of colon (without mention of hemorrhage)    DJD (degenerative joint disease)    knees/hands/feet/back/neck   Fatty liver    Full dentures    GERD (gastroesophageal reflux disease)    H/O hiatal hernia    History of blood transfusion    with a past surical procedure per patient 06/01/20   Hyperlipidemia    Hypertension    Neuromuscular disorder ([place])    peripheral neuropathy   Non-pressure chronic ulcer of other part of left foot limited to breakdown of skin ([place]) 11/12/2016   Osteomyelitis (HCC)    left 5th metatarsal   PAD (peripheral artery disease) ([place])    Distal aortogram June 2012. Atherectomy left popliteal artery July 2012.    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   Unstable angina ([place]) 09/16/2018   Wears glasses    Past Surgical History:  Procedure Laterality Date   AMPUTATION  11/05/2011   Procedure: AMPUTATION RAY;  Surgeon: Wylene Simmer, MD;  Location: [facility];  Service:  Orthopedics;  Laterality: Right;  Amputation of Right 4&5th Toes   AMPUTATION Left 11/26/2012   Procedure: AMPUTATION RAY;  Surgeon: Wylene Simmer, MD;  Location: [facility];  Service: Orthopedics;  Laterality: Left;  fourth ray amputation   AMPUTATION Right 08/27/2014   Procedure: Transmetatarsal Amputation;  Surgeon: Newt Minion, MD;  Location: [facility];  Service: Orthopedics;  Laterality: Right;   AMPUTATION Right  01/14/2015   Procedure: AMPUTATION BELOW KNEE;  Surgeon: Newt Minion, MD;  Location: [facility];  Service: Orthopedics;  Laterality: Right;   AMPUTATION Left 10/21/2015   Procedure: Left Foot 5th Ray Amputation;  Surgeon: Newt Minion, MD;  Location: [facility];  Service: Orthopedics;  Laterality: Left;   ANTERIOR FUSION CERVICAL SPINE  02/06/06   C4-5, C5-6, C6-7; SURGEON DR. MAX COHEN   AV FISTULA PLACEMENT Left 06/02/2020   Procedure: ARTERIOVENOUS (AV) FISTULA CREATION LEFT;  Surgeon: Waynetta Sandy, MD;  Location: [facility];  Service: Vascular;  Laterality: Left;   BACK SURGERY     x 3   [place] Left 07/21/2020   Procedure: LEFT UPPER ARM ATERIOVENOUS SUPERFISTULALIZATION;  Surgeon: Waynetta Sandy, MD;  Location: [facility];  Service: Vascular;  Laterality: Left;   BELOW KNEE LEG AMPUTATION Right    CARDIAC CATHETERIZATION  10/31/04   2009   CAROTID ENDARTERECTOMY  11/10/10   CAROTID ENDARTERECTOMY Left 11/10/2010   Subtotal occlusion of left internal carotid artery with left hemispheric transient ischemic attacks.   CAROTID STENT     CARPAL TUNNEL RELEASE Right 10/21/2013   Procedure: RIGHT CARPAL TUNNEL RELEASE;  Surgeon: Wynonia Sours, MD;  Location: [facility];  Service: Orthopedics;  Laterality: Right;   CHOLECYSTECTOMY     COLON SURGERY     COLONOSCOPY     COLOSTOMY REVERSAL  05/21/2018   ileostomy reversal   CYSTOSCOPY WITH STENT PLACEMENT Bilateral 01/13/2018   Procedure: CYSTOSCOPY WITH BILATERAL URETERAL CATHETER PLACEMENT;  Surgeon:  Ardis Hughs, MD;  Location[facility];  Service: Urology;  Laterality: Bilateral;   ESOPHAGEAL MANOMETRY Bilateral 07/19/2014   Procedure: ESOPHAGEAL MANOMETRY (EM);  Surgeon: Jerene Bears, MD;  [facility];  Service: Gastroenterology;  Laterality: Bilateral;   EYE SURGERY Bilateral 2020   cataract   FEMORAL ARTERY STENT     x6   FINGER SURGERY     FOOT SURGERY  04/25/2016    EXCISION BASE 5TH METATARSAL AND PARTIAL CUBOID LEFT FOOT   HERNIA REPAIR     LEFT INGUINAL AND UMBILICAL REPAIRS   HERNIA REPAIR     I & D EXTREMITY Left 04/25/2016   Procedure: EXCISION BASE 5TH METATARSAL AND PARTIAL CUBOID LEFT FOOT;  Surgeon: Newt Minion, MD;  Location: [facility];  Service: Orthopedics;  Laterality: Left;   ILEOSTOMY  01/13/2018   Procedure: ILEOSTOMY;  Surgeon: Clovis Riley, MD;  Location[facility];  Service: General;;   ILEOSTOMY CLOSURE N/A 05/21/2018   Procedure: ILEOSTOMY REVERSAL ERAS PATHWAY;  Surgeon: Clovis Riley, MD;  Location: [facility];  Service: General;  Laterality: N/A;   IR RADIOLOGIST EVAL & MGMT  11/19/2017   IR RADIOLOGIST EVAL & MGMT  12/03/2017   IR RADIOLOGIST EVAL & MGMT  12/18/2017   JOINT REPLACEMENT Right 2001   Total knee   LAMINECTOMY     X 3 LUMBAR AND X 2 CERVICAL SPINE OPERATIONS   LAPAROSCOPIC CHOLECYSTECTOMY W/ CHOLANGIOGRAPHY  11/09/04   SURGEON DR. Bainbridge CATH AND CORONARY ANGIOGRAPHY N/A 09/16/2018   Procedure: LEFT HEART CATH AND CORONARY ANGIOGRAPHY;  Surgeon: Nigel Mormon, MD;  Location: [facility];  Service: Cardiovascular;  Laterality: N/A;   LEFT HEART CATHETERIZATION WITH CORONARY ANGIOGRAM N/A 10/29/2014   Procedure: LEFT HEART CATHETERIZATION WITH CORONARY ANGIOGRAM;  Surgeon: Laverda Page, MD;  [facility];  Service: Cardiovascular;  Laterality:  N/A;   LIGATION OF COMPETING BRANCHES OF ARTERIOVENOUS FISTULA Left 07/21/2020   Procedure: LIGATION OF COMPETING BRANCHES OF LEFT UPPER ARM  ARTERIOVENOUS FISTULA;  Surgeon: Waynetta Sandy, MD;  Location: [facility];  Service: Vascular;  Laterality: Left;   LOWER EXTREMITY ANGIOGRAM N/A 03/19/2012   Procedure: LOWER EXTREMITY ANGIOGRAM;  Surgeon: Burnell Blanks, MD;  Location: [facility];  Service: Cardiovascular;  Laterality: N/A;   NECK SURGERY     PARTIAL COLECTOMY N/A 01/13/2018   Procedure: LAPAROSCOPIC ASSISTED   SIGMOID COLECTOMY ILEOSTOMY;  Surgeon: Clovis Riley, MD;  Location[facility];  Service: General;  Laterality: N/A;   PENILE PROSTHESIS IMPLANT  08/14/05   INFRAPUBIC INSERTION OF INFLATABLE PENILE PROSTHESIS; SURGEON DR. Amalia Hailey   PENILE PROSTHESIS IMPLANT     PERCUTANEOUS CORONARY STENT INTERVENTION (PCI-S) Right 10/29/2014   Procedure: PERCUTANEOUS CORONARY STENT INTERVENTION (PCI-S);  Surgeon: Laverda Page, MD;  Location: [facility] CATH LAB;  Service: Cardiovascular;  Laterality: Right;   SHOULDER ARTHROSCOPY     SPINE SURGERY     TOE AMPUTATION Left    TONSILLECTOMY     TOTAL KNEE ARTHROPLASTY  07/2002   RIGHT KNEE ; SURGEON  DR. [name] ALSO HAD ARTHROSCOPIC RIGHT KNEE IN  10/2001   TOTAL KNEE ARTHROPLASTY     ULNAR NERVE TRANSPOSITION Right 10/21/2013   Procedure: RIGHT ELBOW  ULNAR NERVE DECOMPRESSION;  Surgeon: Wynonia Sours, MD;  Location: [facility];  Service: Orthopedics;  Laterality: Right;    FAMILY HISTORY Family History  Problem Relation Age of Onset   Heart disease Father        Before age 22-  CAD, BPG   Diabetes Father        Amputation   Cancer Father        PROSTATE   Hyperlipidemia Father    Hypertension Father    Heart attack Father        Triple BPG   Varicose Veins Father    Colon cancer Brother    Diabetes Brother    Heart disease Brother 42       A-Fib. Before age 62   Hyperlipidemia Brother    Hypertension Brother    Cancer Sister        Breast   Hyperlipidemia Sister    Hypertension Sister    Hypertension Son    Arthritis Other         GRANDMOTHER   Hypertension Other        OTHER FAMILY MEMBERS    SOCIAL HISTORY Social History   Tobacco Use   Smoking status: Former    Packs/day: 2.00    Years: 35.00    Pack years: 70.00    Types: Cigarettes    Quit date: 10/28/2011    Years since quitting: 9.4   Smokeless tobacco: Never  Vaping Use   Vaping Use: Some days   Substances: Nicotine  Substance Use Topics   Alcohol use: Not Currently    Comment: "not in a long time"   Drug use: Never         OPHTHALMIC EXAM:  Base Eye Exam     Visual Acuity (ETDRS)       Right Left   Dist [place] 20/200 20/25 -1   Dist ph [place] 20/100 -1          Tonometry (Tonopen, 2:58 PM)       Right Left   Pressure 15 15  Pupils       Pupils Dark Light Shape React APD   Right PERRL 4 3 Round Brisk None   Left PERRL 3 2 Round Brisk None         Visual Fields (Counting fingers)       Left Right    Full Full         Extraocular Movement       Right Left    Full Full         Neuro/Psych     Oriented x3: Yes   Mood/Affect: Normal         Dilation     Right eye: 1.0% Mydriacyl, 2.5% Phenylephrine @ 2:58 PM           Slit Lamp and Fundus Exam     External Exam       Right Left   External Normal Normal         Slit Lamp Exam       Right Left   Lids/Lashes Normal Normal   Conjunctiva/Sclera White and quiet White and quiet   Cornea Clear Clear   Anterior Chamber Deep and quiet Deep and quiet   Iris Round and reactive Round and reactive   Lens Posterior chamber intraocular lens Posterior chamber intraocular lens   Anterior Vitreous Normal Normal         Fundus Exam       Right Left   Posterior Vitreous Normal    Disc Normal    C/D Ratio 0.4    Macula Exudates, Microaneurysms, no apparent CSME    Vessels NPDR severe, with numerous dot blot hemorrhages, VCABS, [place]ma.  Some evidence of incomplete central retinal vein occlusion    Periphery Normal, good PRP 360 yet large dot  blot hemorrhages are noted temporally             IMAGING AND PROCEDURES  Imaging and Procedures for 03/20/21  OCT, Retina - OU - Both Eyes       Right Eye Quality was good. Scan locations included subfoveal. Central Foveal Thickness: 439. Progression has improved. Findings include abnormal foveal contour, cystoid macular edema.   Left Eye Quality was good. Scan locations included subfoveal. Central Foveal Thickness: 279. Progression has been stable. Findings include normal foveal contour.   Notes Vastly improved center involved CSME post last injection Eylea at 6 weeks concomitant with the institution of renal dialysis 5 weeks previous.  Thus the effect of renal retinopathy now mitigated with dialysis.     Intravitreal Injection, Pharmacologic Agent - OD - Right Eye       Time Out 03/20/2021. 3:38 PM. Confirmed correct patient, procedure, site, and patient consented.   Anesthesia Topical anesthesia was used. Anesthetic medications included [place] 3.5%.   Procedure Preparation included 5% betadine to ocular surface, 10% betadine to eyelids, Ofloxacin . A 30 gauge needle was used.   Injection: 2 mg aflibercept 2 MG/0.05ML   Route: Intravitreal, Site: Right Eye   NDC: [id number], Lot: [id number], Waste: 0 mL   Post-op Post injection exam found visual acuity of at least counting fingers. The patient tolerated the procedure well. There were no complications. The patient received written and verbal post procedure care education. Post injection medications were not given.              ASSESSMENT/PLAN:  Severe nonproliferative diabetic retinopathy of left eye, with macular edema, associated with type 2 diabetes mellitus (HCC) No active macular edema left eye  ICD-10-CM   1. Severe nonproliferative diabetic retinopathy of right eye, with macular edema, associated with type 2 diabetes mellitus (HCC)  E11.3411 OCT, Retina - OU - Both Eyes    Intravitreal Injection,  Pharmacologic Agent - OD - Right Eye    aflibercept (EYLEA) SOLN 2 mg    2. Severe nonproliferative diabetic retinopathy of left eye, with macular edema, associated with type 2 diabetes mellitus ([place])  X73.5329       1.  Severe NPDR OD with moderate scatter PRP peripheral and anterior.  Ongoing chronic CSME recurrent center involved.  Clinically there are diffuse intraretinal hemorrhage which gives the appearance of central retinal vein occlusion superimposed upon the underlying severe NPDR.  Nonetheless we will treat with intravitreal Eylea today to maintain and repeat again evaluation in 6 weeks.  Notably much improved as compared to examination March 2022  2.  3.  Ophthalmic Meds Ordered this visit:  Meds ordered this encounter  Medications   aflibercept (EYLEA) SOLN 2 mg       Return in about 6 weeks (around 05/01/2021) for dilate, OD, EYLEA OCT.  There are no Patient Instructions on file for this visit.   Explained the diagnoses, plan, and follow up with the patient and they expressed understanding.  Patient expressed understanding of the importance of proper follow up care.   Clent Demark Makael Stein M.D. Diseases & Surgery of the Retina and Vitreous Retina [facility] Diabetic [place] 03/20/21     Abbreviations: M myopia (nearsighted); A astigmatism; H hyperopia (farsighted); P presbyopia; Mrx spectacle prescription;  CTL contact lenses; OD right eye; OS left eye; OU both eyes  XT exotropia; ET esotropia; PEK punctate epithelial keratitis; PEE punctate epithelial erosions; DES dry eye syndrome; MGD meibomian gland dysfunction; ATs artificial tears; PFAT's preservative free artificial tears; [place] nuclear sclerotic cataract; PSC posterior subcapsular cataract; ERM epi-retinal membrane; PVD posterior vitreous detachment; RD retinal detachment; DM diabetes mellitus; DR diabetic retinopathy; NPDR non-proliferative diabetic retinopathy; PDR proliferative diabetic retinopathy; CSME clinically  significant macular edema; DME diabetic macular edema; dbh dot blot hemorrhages; CWS cotton wool spot; POAG primary open angle glaucoma; C/D cup-to-disc ratio; HVF humphrey visual field; GVF goldmann visual field; OCT optical coherence tomography; IOP intraocular pressure; BRVO Branch retinal vein occlusion; CRVO central retinal vein occlusion; CRAO central retinal artery occlusion; BRAO branch retinal artery occlusion; RT retinal tear; SB scleral buckle; PPV pars plana vitrectomy; VH Vitreous hemorrhage; PRP panretinal laser photocoagulation; IVK intravitreal kenalog; VMT vitreomacular traction; MH Macular hole;  NVD neovascularization of the disc; NVE neovascularization elsewhere; AREDS age related eye disease study; ARMD age related macular degeneration; POAG primary open angle glaucoma; EBMD epithelial/anterior basement membrane dystrophy; ACIOL anterior chamber intraocular lens; IOL intraocular lens; PCIOL posterior chamber intraocular lens; Phaco/IOL phacoemulsification with intraocular lens placement; [place] photorefractive keratectomy; LASIK laser assisted in situ keratomileusis; HTN hypertension; DM diabetes mellitus; COPD chronic obstructive pulmonary disease

## 2021-03-24 ENCOUNTER — Ambulatory Visit (INDEPENDENT_AMBULATORY_CARE_PROVIDER_SITE_OTHER): Payer: 59 | Admitting: Family

## 2021-03-24 ENCOUNTER — Encounter: Payer: Self-pay | Admitting: Family

## 2021-03-24 DIAGNOSIS — I87332 Chronic venous hypertension (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: Secondary | ICD-10-CM

## 2021-03-24 DIAGNOSIS — R600 Localized edema: Secondary | ICD-10-CM

## 2021-03-24 DIAGNOSIS — L97929 Non-pressure chronic ulcer of unspecified part of left lower leg with unspecified severity: Secondary | ICD-10-CM | POA: Diagnosis not present

## 2021-03-24 NOTE — Progress Notes (Signed)
Office Visit Note   Patient: [name]           Date of Birth: [birth year]           MRN: [id number] Visit Date: 03/24/2021              Requested by: [name], PA-C [address] [place],  [place] [address] PCP: [name], PA-C  Chief Complaint  Patient presents with   Left Leg - Follow-up      HPI: Patient is a 65 year old gentleman who comes in for left leg swelling.  Seen today for serial Dynaflex wrapping.  There is no change today does have wrinkling of the skin from the compression Assessment & Plan: Visit Diagnoses: No diagnosis found.  Plan: We will go ahead and repeat applied the Dynaflex wrap silver cell in the wound bed he will follow-up Monday as scheduled Follow-Up Instructions: Return in about 2 weeks (around 04/07/2021).   Ortho Exam  Patient is alert, oriented, no adenopathy, well-dressed, normal affect, normal respiratory effort. Examination of his ankle demonstrates small 2 x 5 mm open ulceration.  This is 1 mm deep with thin flat pink tissue no surrounding cellulitis no ascending cellulitis minimal drainage had fibrinous tissue which was debrided to healthy bleeding tissue.  Imaging: No results found. No images are attached to the encounter.  Labs: Lab Results  Component Value Date   HGBA1C 5.7 (H) 09/16/2018   HGBA1C 7.7 (H) 05/15/2018   HGBA1C 7.1 (H) 01/07/2018   ESRSEDRATE 35 (H) 09/03/2016   ESRSEDRATE 111 (H) 05/11/2016   ESRSEDRATE 98 (H) 05/04/2016   CRP 1.2 (H) 09/03/2016   CRP 4.7 (H) 05/11/2016   CRP 8.0 (H) 05/04/2016   REPTSTATUS 11/15/2017 FINAL 11/10/2017   GRAMSTAIN  11/04/2017    ABUNDANT WBC PRESENT, PREDOMINANTLY PMN MODERATE GRAM NEGATIVE RODS MODERATE GRAM POSITIVE COCCI Performed at [facility], [place] [address], [place], [place] [address]    CULT  11/10/2017    NO GROWTH 5 DAYS Performed at [facility] [address], [place], [place] [address]    LABORGA STAPHYLOCOCCUS AUREUS  09/26/2014     Lab Results  Component Value Date   ALBUMIN 3.7 (L) 05/30/2020   ALBUMIN 2.9 (L) 06/19/2019   ALBUMIN 3.5 05/15/2018   PREALBUMIN 19.0 05/04/2016   PREALBUMIN 25.0 12/16/2015    Lab Results  Component Value Date   MG 1.9 05/24/2018   MG 2.1 05/23/2018   MG 1.3 (L) 05/22/2018   No results found for: VD25OH  Lab Results  Component Value Date   PREALBUMIN 19.0 05/04/2016   PREALBUMIN 25.0 12/16/2015   CBC EXTENDED Latest Ref Rng & Units 11/18/2020 10/17/2020 10/03/2020  WBC 4.0 - 10.5 K/uL 7.7 - -  RBC 4.22 - 5.81 MIL/uL 3.21(L) - -  HGB 13.0 - 17.0 g/dL 9.1(L) 9.8(L) 9.5(L)  HCT 39.0 - 52.0 % 28.7(L) - -  PLT 150 - 400 K/uL 149(L) - -  NEUTROABS 1.7 - 7.7 K/uL - - -  LYMPHSABS 0.7 - 4.0 K/uL - - -     There is no height or weight on file to calculate BMI.  Orders:  No orders of the defined types were placed in this encounter.  No orders of the defined types were placed in this encounter.    Procedures: No procedures performed  Clinical Data: No additional findings.  ROS:  All other systems negative, except as noted in the HPI. Review of Systems  Objective: Vital Signs: There were no vitals taken for this visit.  Specialty Comments:  No specialty comments available.  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   Pseudoclaudication 11/15/2018   Essential hypertension 09/18/2018   S/P BKA (below knee amputation), right ([place]) 09/18/2018   Amputation of toe of left foot ([place]) 09/18/2018   Peripheral artery disease  ([place]) 09/18/2018   S/P carotid endarterectomy 09/18/2018   OSA on CPAP 09/18/2018   Chronic back pain 09/18/2018   Status post reversal of ileostomy 05/21/2018   Normocytic anemia 02/14/2018   Intra-abdominal abscess ([place]) 11/04/2017   Chronic venous hypertension (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 ([place]) 12/11/2016   Unilateral primary osteoarthritis, left knee 10/11/2016   History of right below knee amputation ([place]) 07/12/2016   Diabetic foot infection ([place]) 05/04/2016   Type 2 diabetes mellitus with insulin therapy ([place]) 12/16/2015   Amputated toe ([place]) 10/21/2015   Leg edema, left 09/03/2015   CAD (dz of distal, mid and proximal RCA with implantation of 3 overlapping drug-eluting stent,) 09/03/2015   Chronic diastolic heart failure, NYHA class 2 ([place]) 09/03/2015   Angina pectoris associated with type 2 diabetes mellitus ([place]) 10/28/2014   Hyperlipidemia 08/25/2014   Limb pain 03/20/2013   DJD (degenerative joint disease) 09/25/2012   Migraine 09/25/2012   Neuropathy 09/25/2012   Restless legs syndrome (RLS) 09/25/2012   Chronic obstructive pulmonary disease, unspecified ([place]) 04/25/2012   Unknown cause of morbidity or mortality 04/25/2012   Chronic total occlusion of artery of the extremities ([place]) 04/08/2012   Onychomycosis 02/01/2012   Occlusion and stenosis of carotid artery without mention of cerebral infarction 06/12/2011   GERD (gastroesophageal reflux disease) 05/08/2011   Barrett's esophagus without dysplasia 05/08/2011   Former tobacco use 02/01/2011   Past Medical History:  Diagnosis Date   Carotid artery occlusion 11/10/10   LEFT CAROTID ENDARTERECTOMY   Chronic kidney disease    Complication of anesthesia    BP WENT UP AT [facility] "   COPD (chronic obstructive pulmonary disease) ([place])    pt denies this dx as of 06/01/20 - no inhaler    Diabetes mellitus without complication ([place])    Diverticulitis    Diverticulosis of colon (without  mention of hemorrhage)    DJD (degenerative joint disease)    knees/hands/feet/back/neck   Fatty liver    Full dentures    GERD (gastroesophageal reflux disease)    H/O hiatal hernia    History of blood transfusion    with a past surical procedure per patient 06/01/20   Hyperlipidemia    Hypertension    Neuromuscular disorder ([place])    peripheral neuropathy   Non-pressure chronic ulcer of other part of left foot limited to breakdown of skin ([place]) 11/12/2016   Osteomyelitis ([facility])    left 5th metatarsal   PAD (peripheral artery disease) ([place])    Distal aortogram June 2012. Atherectomy left popliteal artery July 2012.    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 Before age 56   Hyperlipidemia Brother    Hypertension Brother    Cancer Sister        Breast   Hyperlipidemia Sister    Hypertension Sister    Hypertension Son    Arthritis Other        GRANDMOTHER   Hypertension Other        OTHER FAMILY MEMBERS    Past Surgical History:  Procedure Laterality Date   AMPUTATION  11/05/2011   Procedure: AMPUTATION RAY;  Surgeon: Wylene Simmer, MD;  Location: [facility];  Service: Orthopedics;  Laterality: Right;  Amputation of Right 4&5th Toes   AMPUTATION Left 11/26/2012   Procedure: AMPUTATION RAY;  Surgeon: Wylene Simmer, MD;  Location: [facility];  Service: Orthopedics;  Laterality: Left;  fourth ray amputation   AMPUTATION Right 08/27/2014   Procedure:  Transmetatarsal Amputation;  Surgeon: Newt Minion, MD;  Location: [facility];  Service: Orthopedics;  Laterality: Right;   AMPUTATION Right 01/14/2015   Procedure: AMPUTATION BELOW KNEE;  Surgeon: Newt Minion, MD;  Location: [facility];  Service: Orthopedics;  Laterality: Right;   AMPUTATION Left 10/21/2015   Procedure: Left Foot 5th Ray Amputation;  Surgeon: Newt Minion, MD;  Location: [facility];  Service: Orthopedics;  Laterality: Left;   ANTERIOR FUSION CERVICAL SPINE  02/06/06   C4-5, C5-6, C6-7; SURGEON DR. MAX COHEN   AV FISTULA PLACEMENT Left 06/02/2020   Procedure: ARTERIOVENOUS (AV) FISTULA CREATION LEFT;  Surgeon: Waynetta Sandy, MD;  Location: [facility];  Service: Vascular;  Laterality: Left;   BACK SURGERY     x 3   [place] Left 07/21/2020   Procedure: LEFT UPPER ARM ATERIOVENOUS SUPERFISTULALIZATION;  Surgeon: Waynetta Sandy, MD;  Location: [facility];  Service: Vascular;  Laterality: Left;   BELOW KNEE LEG AMPUTATION Right    CARDIAC CATHETERIZATION  10/31/04   2009   CAROTID ENDARTERECTOMY  11/10/10   CAROTID ENDARTERECTOMY Left 11/10/2010   Subtotal occlusion of left internal carotid artery with left hemispheric transient ischemic attacks.   CAROTID STENT     CARPAL TUNNEL RELEASE Right 10/21/2013   Procedure: RIGHT CARPAL TUNNEL RELEASE;  Surgeon: Wynonia Sours, MD;  Location: [facility];  Service: Orthopedics;  Laterality: Right;   CHOLECYSTECTOMY     COLON SURGERY     COLONOSCOPY     COLOSTOMY REVERSAL  05/21/2018   ileostomy reversal   CYSTOSCOPY WITH STENT PLACEMENT Bilateral 01/13/2018   Procedure: CYSTOSCOPY WITH BILATERAL URETERAL CATHETER PLACEMENT;  Surgeon: Ardis Hughs, MD;  Location[facility];  Service: Urology;  Laterality: Bilateral;   ESOPHAGEAL MANOMETRY Bilateral 07/19/2014   Procedure: ESOPHAGEAL MANOMETRY (EM);  Surgeon: Jerene Bears, MD;  [facility];  Service:  Gastroenterology;  Laterality: Bilateral;   EYE  SURGERY Bilateral 2020   cataract   FEMORAL ARTERY STENT     x6   FINGER SURGERY     FOOT SURGERY  04/25/2016    EXCISION BASE 5TH METATARSAL AND PARTIAL CUBOID LEFT FOOT   HERNIA REPAIR     LEFT INGUINAL AND UMBILICAL REPAIRS   HERNIA REPAIR     I & D EXTREMITY Left 04/25/2016   Procedure: EXCISION BASE 5TH METATARSAL AND PARTIAL CUBOID LEFT FOOT;  Surgeon: Newt Minion, MD;  Location: [facility];  Service: Orthopedics;  Laterality: Left;   ILEOSTOMY  01/13/2018   Procedure: ILEOSTOMY;  Surgeon: Clovis Riley, MD;  Location[facility];  Service: General;;   ILEOSTOMY CLOSURE N/A 05/21/2018   Procedure: ILEOSTOMY REVERSAL ERAS PATHWAY;  Surgeon: Clovis Riley, MD;  Location: [facility];  Service: General;  Laterality: N/A;   IR RADIOLOGIST EVAL & MGMT  11/19/2017   IR RADIOLOGIST EVAL & MGMT  12/03/2017   IR RADIOLOGIST EVAL & MGMT  12/18/2017   JOINT REPLACEMENT Right 2001   Total knee   LAMINECTOMY     X 3 LUMBAR AND X 2 CERVICAL SPINE OPERATIONS   LAPAROSCOPIC CHOLECYSTECTOMY W/ CHOLANGIOGRAPHY  11/09/04   SURGEON DR. Barryton CATH AND CORONARY ANGIOGRAPHY N/A 09/16/2018   Procedure: LEFT HEART CATH AND CORONARY ANGIOGRAPHY;  Surgeon: Nigel Mormon, MD;  Location: [facility];  Service: Cardiovascular;  Laterality: N/A;   LEFT HEART CATHETERIZATION WITH CORONARY ANGIOGRAM N/A 10/29/2014   Procedure: LEFT HEART CATHETERIZATION WITH CORONARY ANGIOGRAM;  Surgeon: Laverda Page, MD;  Location[facility];  Service: Cardiovascular;  Laterality: N/A;   LIGATION OF COMPETING BRANCHES OF ARTERIOVENOUS FISTULA Left 07/21/2020   Procedure: LIGATION OF COMPETING BRANCHES OF LEFT UPPER ARM ARTERIOVENOUS FISTULA;  Surgeon: Waynetta Sandy, MD;  Location: [facility];  Service: Vascular;  Laterality: Left;   LOWER EXTREMITY ANGIOGRAM N/A 03/19/2012   Procedure: LOWER EXTREMITY ANGIOGRAM;  Surgeon: Burnell Blanks, MD;  Location: [facility];  Service:  Cardiovascular;  Laterality: N/A;   NECK SURGERY     PARTIAL COLECTOMY N/A 01/13/2018   Procedure: LAPAROSCOPIC ASSISTED   SIGMOID COLECTOMY ILEOSTOMY;  Surgeon: Clovis Riley, MD;  Location[facility];  Service: General;  Laterality: N/A;   PENILE PROSTHESIS IMPLANT  08/14/05   INFRAPUBIC INSERTION OF INFLATABLE PENILE PROSTHESIS; SURGEON DR. Amalia Hailey   PENILE PROSTHESIS IMPLANT     PERCUTANEOUS CORONARY STENT INTERVENTION (PCI-S) Right 10/29/2014   Procedure: PERCUTANEOUS CORONARY STENT INTERVENTION (PCI-S);  Surgeon: Laverda Page, MD;  Location: [facility]ATH LAB;  Service: Cardiovascular;  Laterality: Right;   SHOULDER ARTHROSCOPY     SPINE SURGERY     TOE AMPUTATION Left    TONSILLECTOMY     TOTAL KNEE ARTHROPLASTY  07/2002   RIGHT KNEE ; SURGEON  DR. [name] ALSO HAD ARTHROSCOPIC RIGHT KNEE IN  10/2001   TOTAL KNEE ARTHROPLASTY     ULNAR NERVE TRANSPOSITION Right 10/21/2013   Procedure: RIGHT ELBOW  ULNAR NERVE DECOMPRESSION;  Surgeon: Wynonia Sours, MD;  Location: [facility];  Service: Orthopedics;  Laterality: Right;   Social History   Occupational History   Occupation: Magazine features editor: UNEMPLOYED  Tobacco Use   Smoking status: Former    Packs/day: 2.00    Years: 35.00    Pack years: 70.00    Types: Cigarettes  Quit date: 10/28/2011    Years since quitting: 9.4   Smokeless tobacco: Never  Vaping Use   Vaping Use: Some days   Substances: Nicotine  Substance and Sexual Activity   Alcohol use: Not Currently    Comment: "not in a long time"   Drug use: Never   Sexual activity: Yes    Birth control/protection: Implant    Comment: penile implant

## 2021-03-27 ENCOUNTER — Encounter: Payer: Self-pay | Admitting: Orthopedic Surgery

## 2021-03-27 ENCOUNTER — Ambulatory Visit (INDEPENDENT_AMBULATORY_CARE_PROVIDER_SITE_OTHER): Payer: 59 | Admitting: Physician Assistant

## 2021-03-27 DIAGNOSIS — L97929 Non-pressure chronic ulcer of unspecified part of left lower leg with unspecified severity: Secondary | ICD-10-CM

## 2021-03-27 DIAGNOSIS — I87332 Chronic venous hypertension (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: Secondary | ICD-10-CM | POA: Diagnosis not present

## 2021-03-27 NOTE — Progress Notes (Signed)
Office Visit Note   Patient: [name]           Date of Birth: [birth year]           MRN: [id number] Visit Date: 03/27/2021              Requested by: [name], PA-C [address] [place],  [place] [address] PCP: [name], PA-C  Chief Complaint  Patient presents with   Left Leg - Follow-up      HPI: Patient is here in follow up for his left leg. He has been having dynaflex wraps and silver cell over ankle wound.No complaints  Assessment & Plan: Visit Diagnoses: No diagnosis found.  Plan: Follow up Friday. May consider transitioning to socks  Follow-Up Instructions: No follow-ups on file.   Ortho Exam  Patient is alert, oriented, no adenopathy, well-dressed, normal affect, normal respiratory effort. No swelling or cellulitis on left leg. Ankle wound measures 40mm by 1 mm. No surrounding cellulitis no tunneling does not robe to bone. Fibrinous base was debrided to healthy bleeding tissue  Imaging: No results found. No images are attached to the encounter.  Labs: Lab Results  Component Value Date   HGBA1C 5.7 (H) 09/16/2018   HGBA1C 7.7 (H) 05/15/2018   HGBA1C 7.1 (H) 01/07/2018   ESRSEDRATE 35 (H) 09/03/2016   ESRSEDRATE 111 (H) 05/11/2016   ESRSEDRATE 98 (H) 05/04/2016   CRP 1.2 (H) 09/03/2016   CRP 4.7 (H) 05/11/2016   CRP 8.0 (H) 05/04/2016   REPTSTATUS 11/15/2017 FINAL 11/10/2017   GRAMSTAIN  11/04/2017    ABUNDANT WBC PRESENT, PREDOMINANTLY PMN MODERATE GRAM NEGATIVE RODS MODERATE GRAM POSITIVE COCCI Performed at [facility], [place] [address] [place], [place] [address]    CULT  11/10/2017    NO GROWTH 5 DAYS Performed at [facility] [address], [place], [place] [address]    LABORGA STAPHYLOCOCCUS AUREUS 09/26/2014     Lab Results  Component Value Date   ALBUMIN 3.7 (L) 05/30/2020   ALBUMIN 2.9 (L) 06/19/2019   ALBUMIN 3.5 05/15/2018   PREALBUMIN 19.0 05/04/2016   PREALBUMIN 25.0 12/16/2015    Lab Results   Component Value Date   MG 1.9 05/24/2018   MG 2.1 05/23/2018   MG 1.3 (L) 05/22/2018   No results found for: VD25OH  Lab Results  Component Value Date   PREALBUMIN 19.0 05/04/2016   PREALBUMIN 25.0 12/16/2015   CBC EXTENDED Latest Ref Rng & Units 11/18/2020 10/17/2020 10/03/2020  WBC 4.0 - 10.5 K/uL 7.7 - -  RBC 4.22 - 5.81 MIL/uL 3.21(L) - -  HGB 13.0 - 17.0 g/dL 9.1(L) 9.8(L) 9.5(L)  HCT 39.0 - 52.0 % 28.7(L) - -  PLT 150 - 400 K/uL 149(L) - -  NEUTROABS 1.7 - 7.7 K/uL - - -  LYMPHSABS 0.7 - 4.0 K/uL - - -     There is no height or weight on file to calculate BMI.  Orders:  No orders of the defined types were placed in this encounter.  No orders of the defined types were placed in this encounter.    Procedures: No procedures performed  Clinical Data: No additional findings.  ROS:  All other systems negative, except as noted in the HPI. Review of Systems  Objective: Vital Signs: There were no vitals taken for this visit.  Specialty Comments:  No specialty comments available.  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   Pseudoclaudication 11/15/2018   Essential hypertension 09/18/2018   S/P BKA (below knee amputation), right ([place]) 09/18/2018   Amputation of toe of left foot ([place]) 09/18/2018   Peripheral artery disease ([place]) 09/18/2018   S/P carotid endarterectomy 09/18/2018   OSA on CPAP 09/18/2018   Chronic back pain 09/18/2018   Status post reversal of ileostomy 05/21/2018   Normocytic anemia 02/14/2018   Intra-abdominal  abscess ([place]) 11/04/2017   Chronic venous hypertension (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 ([place]) 12/11/2016   Unilateral primary osteoarthritis, left knee 10/11/2016   History of right below knee amputation ([place]) 07/12/2016   Diabetic foot infection ([place]) 05/04/2016   Type 2 diabetes mellitus with insulin therapy ([place]) 12/16/2015   Amputated toe ([place]) 10/21/2015   Leg edema, left 09/03/2015   CAD (dz of distal, mid and proximal RCA with implantation of 3 overlapping drug-eluting stent,) 09/03/2015   Chronic diastolic heart failure, NYHA class 2 ([place]) 09/03/2015   Angina pectoris associated with type 2 diabetes mellitus ([place]) 10/28/2014   Hyperlipidemia 08/25/2014   Limb pain 03/20/2013   DJD (degenerative joint disease) 09/25/2012   Migraine 09/25/2012   Neuropathy 09/25/2012   Restless legs syndrome (RLS) 09/25/2012   Chronic obstructive pulmonary disease, unspecified ([place]) 04/25/2012   Unknown cause of morbidity or mortality 04/25/2012   Chronic total occlusion of artery of the extremities ([place]) 04/08/2012   Onychomycosis 02/01/2012   Occlusion and stenosis of carotid artery without mention of cerebral infarction 06/12/2011   GERD (gastroesophageal reflux disease) 05/08/2011   Barrett's esophagus without dysplasia 05/08/2011   Former tobacco use 02/01/2011   Past Medical History:  Diagnosis Date   Carotid artery occlusion 11/10/10   LEFT CAROTID ENDARTERECTOMY   Chronic kidney disease    Complication of anesthesia    BP WENT UP AT [facility] "   COPD (chronic obstructive pulmonary disease) ([place])    pt denies this dx as of 06/01/20 - no inhaler    Diabetes mellitus without complication ([place])    Diverticulitis    Diverticulosis of colon (without mention of hemorrhage)    DJD (degenerative joint disease)    knees/hands/feet/back/neck   Fatty liver    Full dentures    GERD (gastroesophageal reflux disease)    H/O hiatal hernia    History of blood  transfusion    with a past surical procedure per patient 06/01/20   Hyperlipidemia    Hypertension    Neuromuscular disorder ([place])    peripheral neuropathy   Non-pressure chronic ulcer of other part of left foot limited to breakdown of skin ([place]) 11/12/2016   Osteomyelitis ([facility])    left 5th metatarsal   PAD (peripheral artery disease) ([place])    Distal aortogram June 2012. Atherectomy left popliteal artery July 2012.    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 Before age 43   Hyperlipidemia Brother    Hypertension Brother    Cancer Sister        Breast   Hyperlipidemia Sister    Hypertension Sister    Hypertension Son    Arthritis Other        GRANDMOTHER   Hypertension Other        OTHER FAMILY MEMBERS    Past Surgical History:  Procedure Laterality Date   AMPUTATION  11/05/2011   Procedure: AMPUTATION RAY;  Surgeon: Wylene Simmer, MD;  Location: [facility];  Service: Orthopedics;  Laterality: Right;  Amputation of Right 4&5th Toes   AMPUTATION Left 11/26/2012   Procedure: AMPUTATION RAY;  Surgeon: Wylene Simmer, MD;  Location: [facility];  Service: Orthopedics;  Laterality: Left;  fourth ray amputation   AMPUTATION Right 08/27/2014   Procedure: Transmetatarsal Amputation;  Surgeon: Newt Minion, MD;  Location: [facility];  Service: Orthopedics;  Laterality: Right;   AMPUTATION Right 01/14/2015   Procedure: AMPUTATION BELOW KNEE;  Surgeon: Newt Minion, MD;   Location: [facility];  Service: Orthopedics;  Laterality: Right;   AMPUTATION Left 10/21/2015   Procedure: Left Foot 5th Ray Amputation;  Surgeon: Newt Minion, MD;  Location: [facility];  Service: Orthopedics;  Laterality: Left;   ANTERIOR FUSION CERVICAL SPINE  02/06/06   C4-5, C5-6, C6-7; SURGEON DR. MAX COHEN   AV FISTULA PLACEMENT Left 06/02/2020   Procedure: ARTERIOVENOUS (AV) FISTULA CREATION LEFT;  Surgeon: Waynetta Sandy, MD;  Location: [facility];  Service: Vascular;  Laterality: Left;   BACK SURGERY     x 3   [place] Left 07/21/2020   Procedure: LEFT UPPER ARM ATERIOVENOUS SUPERFISTULALIZATION;  Surgeon: Waynetta Sandy, MD;  Location: [facility];  Service: Vascular;  Laterality: Left;   BELOW KNEE LEG AMPUTATION Right    CARDIAC CATHETERIZATION  10/31/04   2009   CAROTID ENDARTERECTOMY  11/10/10   CAROTID ENDARTERECTOMY Left 11/10/2010   Subtotal occlusion of left internal carotid artery with left hemispheric transient ischemic attacks.   CAROTID STENT     CARPAL TUNNEL RELEASE Right 10/21/2013   Procedure: RIGHT CARPAL TUNNEL RELEASE;  Surgeon: Wynonia Sours, MD;  Location: [facility];  Service: Orthopedics;  Laterality: Right;   CHOLECYSTECTOMY     COLON SURGERY     COLONOSCOPY     COLOSTOMY REVERSAL  05/21/2018   ileostomy reversal   CYSTOSCOPY WITH STENT PLACEMENT Bilateral 01/13/2018   Procedure: CYSTOSCOPY WITH BILATERAL URETERAL CATHETER PLACEMENT;  Surgeon: Ardis Hughs, MD;  Location: [facility];  Service: Urology;  Laterality: Bilateral;   ESOPHAGEAL MANOMETRY Bilateral 07/19/2014   Procedure: ESOPHAGEAL MANOMETRY (EM);  Surgeon: Jerene Bears, MD;  [facility];  Service: Gastroenterology;  Laterality: Bilateral;   EYE SURGERY Bilateral 2020   cataract   FEMORAL ARTERY STENT     x6   FINGER SURGERY     FOOT SURGERY  04/25/2016    EXCISION BASE 5TH METATARSAL AND PARTIAL CUBOID LEFT FOOT   HERNIA REPAIR     LEFT INGUINAL  AND UMBILICAL REPAIRS   HERNIA REPAIR     I & D EXTREMITY Left 04/25/2016   Procedure: EXCISION BASE 5TH METATARSAL AND PARTIAL CUBOID LEFT FOOT;  Surgeon: Newt Minion, MD;  Location: [facility];  Service: Orthopedics;  Laterality: Left;   ILEOSTOMY  01/13/2018   Procedure: ILEOSTOMY;  Surgeon: Clovis Riley, MD;  Location: [facility];  Service: General;;   ILEOSTOMY CLOSURE N/A 05/21/2018   Procedure: ILEOSTOMY REVERSAL ERAS PATHWAY;  Surgeon: Clovis Riley, MD;  Location: [facility];  Service: General;  Laterality: N/A;   IR RADIOLOGIST EVAL & MGMT  11/19/2017   IR RADIOLOGIST EVAL & MGMT  12/03/2017   IR RADIOLOGIST EVAL & MGMT  12/18/2017   JOINT REPLACEMENT Right 2001   Total knee   LAMINECTOMY     X 3 LUMBAR AND X 2 CERVICAL SPINE OPERATIONS   LAPAROSCOPIC CHOLECYSTECTOMY W/ CHOLANGIOGRAPHY  11/09/04   SURGEON DR. [place] CATH AND CORONARY ANGIOGRAPHY N/A 09/16/2018   Procedure: LEFT HEART CATH AND CORONARY ANGIOGRAPHY;  Surgeon: Nigel Mormon, MD;  Location: [facility];  Service: Cardiovascular;  Laterality: N/A;   LEFT HEART CATHETERIZATION WITH CORONARY ANGIOGRAM N/A 10/29/2014   Procedure: LEFT HEART CATHETERIZATION WITH CORONARY ANGIOGRAM;  Surgeon: Laverda Page, MD;  Location: [facility];  Service: Cardiovascular;  Laterality: N/A;   LIGATION OF COMPETING BRANCHES OF ARTERIOVENOUS FISTULA Left 07/21/2020   Procedure: LIGATION OF COMPETING BRANCHES OF LEFT UPPER ARM ARTERIOVENOUS FISTULA;  Surgeon: Waynetta Sandy, MD;  Location: [facility];  Service: Vascular;  Laterality: Left;   LOWER EXTREMITY ANGIOGRAM N/A 03/19/2012   Procedure: LOWER EXTREMITY ANGIOGRAM;  Surgeon: Burnell Blanks, MD;  Location: [facility];  Service: Cardiovascular;  Laterality: N/A;   NECK SURGERY     PARTIAL COLECTOMY N/A 01/13/2018   Procedure: LAPAROSCOPIC ASSISTED   SIGMOID COLECTOMY ILEOSTOMY;  Surgeon: Clovis Riley, MD;  Location[facility];  Service: General;   Laterality: N/A;   PENILE PROSTHESIS IMPLANT  08/14/05   INFRAPUBIC INSERTION OF INFLATABLE PENILE PROSTHESIS; SURGEON DR. Amalia Hailey   PENILE PROSTHESIS IMPLANT     PERCUTANEOUS CORONARY STENT INTERVENTION (PCI-S) Right 10/29/2014   Procedure: PERCUTANEOUS CORONARY STENT INTERVENTION (PCI-S);  Surgeon: Laverda Page, MD;  Location: [facility];  Service: Cardiovascular;  Laterality: Right;   SHOULDER ARTHROSCOPY     SPINE SURGERY     TOE AMPUTATION Left    TONSILLECTOMY     TOTAL KNEE ARTHROPLASTY  07/2002   RIGHT KNEE ; SURGEON  DR. [name] ALSO HAD ARTHROSCOPIC RIGHT KNEE IN  10/2001   TOTAL KNEE ARTHROPLASTY     ULNAR NERVE TRANSPOSITION Right 10/21/2013   Procedure: RIGHT ELBOW  ULNAR NERVE DECOMPRESSION;  Surgeon: Wynonia Sours, MD;  Location: [facility];  Service: Orthopedics;  Laterality: Right;   Social History   Occupational History   Occupation: Magazine features editor: UNEMPLOYED  Tobacco Use   Smoking status: Former    Packs/day: 2.00    Years: 35.00    Pack years: 70.00    Types: Cigarettes    Quit date: 10/28/2011    Years since quitting: 9.4   Smokeless tobacco: Never  Vaping Use   Vaping Use: Some days   Substances: Nicotine  Substance and Sexual Activity  Alcohol use: Not Currently    Comment: "not in a long time"   Drug use: Never   Sexual activity: Yes    Birth control/protection: Implant    Comment: penile implant

## 2021-03-31 ENCOUNTER — Encounter: Payer: Self-pay | Admitting: Physician Assistant

## 2021-03-31 ENCOUNTER — Ambulatory Visit (INDEPENDENT_AMBULATORY_CARE_PROVIDER_SITE_OTHER): Payer: 59 | Admitting: Physician Assistant

## 2021-03-31 ENCOUNTER — Other Ambulatory Visit: Payer: Self-pay

## 2021-03-31 DIAGNOSIS — L97221 Non-pressure chronic ulcer of left calf limited to breakdown of skin: Secondary | ICD-10-CM

## 2021-03-31 NOTE — Progress Notes (Signed)
Office Visit Note   Patient: [name]           Date of Birth: 02/06/1956           MRN: [id number] Visit Date: 03/31/2021              Requested by: [name], PA-C [address] [place],  [place] [address] PCP: [name], PA-C  No chief complaint on file.     HPI: Patient presents in follow-up for his left lower extremity he has been having twice weekly wrapping.  Assessment & Plan: Visit Diagnoses: No diagnosis found.  Plan: Patient will follow-up on Monday he will bring his socks I suspect he can go back into his compression socks at that time  Follow-Up Instructions: No follow-ups on file.   Ortho Exam  Patient is alert, oriented, no adenopathy, well-dressed, normal affect, normal respiratory effort. Examination significant decrease in swelling in his lower extremity.  Lateral calf has only a spot of drainage with no surrounding cellulitis or erythema.  The wound on the ankle is almost completely healed with no surrounding erythema to no ascending cellulitis swelling is very well controlled no other open ulcers  Imaging: No results found. No images are attached to the encounter.  Labs: Lab Results  Component Value Date   HGBA1C 5.7 (H) 09/16/2018   HGBA1C 7.7 (H) 05/15/2018   HGBA1C 7.1 (H) 01/07/2018   ESRSEDRATE 35 (H) 09/03/2016   ESRSEDRATE 111 (H) 05/11/2016   ESRSEDRATE 98 (H) 05/04/2016   CRP 1.2 (H) 09/03/2016   CRP 4.7 (H) 05/11/2016   CRP 8.0 (H) 05/04/2016   REPTSTATUS 11/15/2017 FINAL 11/10/2017   GRAMSTAIN  11/04/2017    ABUNDANT WBC PRESENT, PREDOMINANTLY PMN MODERATE GRAM NEGATIVE RODS MODERATE GRAM POSITIVE COCCI Performed at [facility], [place] [address] [place], [place] [address]    CULT  11/10/2017    NO GROWTH 5 DAYS Performed at [facility] [address] [place], [place] [address]    LABORGA STAPHYLOCOCCUS AUREUS 09/26/2014     Lab Results  Component Value Date   ALBUMIN 3.7 (L) 05/30/2020    ALBUMIN 2.9 (L) 06/19/2019   ALBUMIN 3.5 05/15/2018   PREALBUMIN 19.0 05/04/2016   PREALBUMIN 25.0 12/16/2015    Lab Results  Component Value Date   MG 1.9 05/24/2018   MG 2.1 05/23/2018   MG 1.3 (L) 05/22/2018   No results found for: VD25OH  Lab Results  Component Value Date   PREALBUMIN 19.0 05/04/2016   PREALBUMIN 25.0 12/16/2015   CBC EXTENDED Latest Ref Rng & Units 11/18/2020 10/17/2020 10/03/2020  WBC 4.0 - 10.5 K/uL 7.7 - -  RBC 4.22 - 5.81 MIL/uL 3.21(L) - -  HGB 13.0 - 17.0 g/dL 9.1(L) 9.8(L) 9.5(L)  HCT 39.0 - 52.0 % 28.7(L) - -  PLT 150 - 400 K/uL 149(L) - -  NEUTROABS 1.7 - 7.7 K/uL - - -  LYMPHSABS 0.7 - 4.0 K/uL - - -     There is no height or weight on file to calculate BMI.  Orders:  No orders of the defined types were placed in this encounter.  No orders of the defined types were placed in this encounter.    Procedures: No procedures performed  Clinical Data: No additional findings.  ROS:  All other systems negative, except as noted in the HPI. Review of Systems  Objective: Vital Signs: There were no vitals taken for this visit.  Specialty Comments:  No specialty comments available.  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   Pseudoclaudication 11/15/2018   Essential hypertension 09/18/2018   S/P BKA (below knee amputation), right ([place]) 09/18/2018   Amputation of toe of left foot ([place]) 09/18/2018   Peripheral artery disease ([place]) 09/18/2018   S/P carotid endarterectomy 09/18/2018   OSA on CPAP 09/18/2018    Chronic back pain 09/18/2018   Status post reversal of ileostomy 05/21/2018   Normocytic anemia 02/14/2018   Intra-abdominal abscess ([place]) 11/04/2017   Chronic venous hypertension (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 ([place]) 12/11/2016   Unilateral primary osteoarthritis, left knee 10/11/2016   History of right below knee amputation ([place]) 07/12/2016   Diabetic foot infection ([place]) 05/04/2016   Type 2 diabetes mellitus with insulin therapy ([place]) 12/16/2015   Amputated toe ([place]) 10/21/2015   Leg edema, left 09/03/2015   CAD (dz of distal, mid and proximal RCA with implantation of 3 overlapping drug-eluting stent,) 09/03/2015   Chronic diastolic heart failure, NYHA class 2 ([place]) 09/03/2015   Angina pectoris associated with type 2 diabetes mellitus ([place]) 10/28/2014   Hyperlipidemia 08/25/2014   Limb pain 03/20/2013   DJD (degenerative joint disease) 09/25/2012   Migraine 09/25/2012   Neuropathy 09/25/2012   Restless legs syndrome (RLS) 09/25/2012   Chronic obstructive pulmonary disease, unspecified ([place]) 04/25/2012   Unknown cause of morbidity or mortality 04/25/2012   Chronic total occlusion of artery of the extremities ([place]) 04/08/2012   Onychomycosis 02/01/2012   Occlusion and stenosis of carotid artery without mention of cerebral infarction 06/12/2011   GERD (gastroesophageal reflux disease) 05/08/2011   Barrett's esophagus without dysplasia 05/08/2011   Former tobacco use 02/01/2011   Past Medical History:  Diagnosis Date   Carotid artery occlusion 11/10/10   LEFT CAROTID ENDARTERECTOMY   Chronic kidney disease    Complication of anesthesia    BP WENT UP AT [facility] "   COPD (chronic obstructive pulmonary disease) ([place])    pt denies this dx as of 06/01/20 - no inhaler    Diabetes mellitus without complication ([place])    Diverticulitis    Diverticulosis of colon (without mention of hemorrhage)    DJD (degenerative joint disease)    knees/hands/feet/back/neck    Fatty liver    Full dentures    GERD (gastroesophageal reflux disease)    H/O hiatal hernia    History of blood transfusion    with a past surical procedure per patient 06/01/20   Hyperlipidemia    Hypertension    Neuromuscular disorder ([place])    peripheral neuropathy   Non-pressure chronic ulcer of other part of left foot limited to breakdown of skin ([place]) 11/12/2016   Osteomyelitis ([facility])    left 5th metatarsal   PAD (peripheral artery disease) ([place])    Distal aortogram June 2012. Atherectomy left popliteal artery July 2012.    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 Before age 52   Hyperlipidemia Brother    Hypertension Brother    Cancer Sister        Breast   Hyperlipidemia Sister    Hypertension Sister    Hypertension Son    Arthritis Other        GRANDMOTHER   Hypertension Other        OTHER FAMILY MEMBERS    Past Surgical History:  Procedure Laterality Date   AMPUTATION  11/05/2011   Procedure: AMPUTATION RAY;  Surgeon: Wylene Simmer, MD;  Location: [facility];  Service: Orthopedics;  Laterality: Right;  Amputation of Right 4&5th Toes   AMPUTATION Left 11/26/2012   Procedure: AMPUTATION RAY;  Surgeon: Wylene Simmer, MD;  Location: [facility];  Service: Orthopedics;  Laterality: Left;  fourth ray amputation   AMPUTATION Right 08/27/2014   Procedure: Transmetatarsal Amputation;  Surgeon: Newt Minion, MD;  Location: [facility];  Service:  Orthopedics;  Laterality: Right;   AMPUTATION Right 01/14/2015   Procedure: AMPUTATION BELOW KNEE;  Surgeon: Newt Minion, MD;  Location: [facility];  Service: Orthopedics;  Laterality: Right;   AMPUTATION Left 10/21/2015   Procedure: Left Foot 5th Ray Amputation;  Surgeon: Newt Minion, MD;  Location: [facility];  Service: Orthopedics;  Laterality: Left;   ANTERIOR FUSION CERVICAL SPINE  02/06/06   C4-5, C5-6, C6-7; SURGEON DR. MAX COHEN   AV FISTULA PLACEMENT Left 06/02/2020   Procedure: ARTERIOVENOUS (AV) FISTULA CREATION LEFT;  Surgeon: Waynetta Sandy, MD;  Location: [facility];  Service: Vascular;  Laterality: Left;   BACK SURGERY     x 3   [place] Left 07/21/2020   Procedure: LEFT UPPER ARM ATERIOVENOUS SUPERFISTULALIZATION;  Surgeon: Waynetta Sandy, MD;  Location: [facility];  Service: Vascular;  Laterality: Left;   BELOW KNEE LEG AMPUTATION Right    CARDIAC CATHETERIZATION  10/31/04   2009   CAROTID ENDARTERECTOMY  11/10/10   CAROTID ENDARTERECTOMY Left 11/10/2010   Subtotal occlusion of left internal carotid artery with left hemispheric transient ischemic attacks.   CAROTID STENT     CARPAL TUNNEL RELEASE Right 10/21/2013   Procedure: RIGHT CARPAL TUNNEL RELEASE;  Surgeon: Wynonia Sours, MD;  Location: [facility];  Service: Orthopedics;  Laterality: Right;   CHOLECYSTECTOMY     COLON SURGERY     COLONOSCOPY     COLOSTOMY REVERSAL  05/21/2018   ileostomy reversal   CYSTOSCOPY WITH STENT PLACEMENT Bilateral 01/13/2018   Procedure: CYSTOSCOPY WITH BILATERAL URETERAL CATHETER PLACEMENT;  Surgeon: Ardis Hughs, MD;  Location[facility];  Service: Urology;  Laterality: Bilateral;   ESOPHAGEAL MANOMETRY Bilateral 07/19/2014   Procedure: ESOPHAGEAL MANOMETRY (EM);  Surgeon: Jerene Bears, MD;  [facility];  Service: Gastroenterology;  Laterality: Bilateral;   EYE SURGERY Bilateral 2020   cataract   FEMORAL ARTERY  STENT     x6   FINGER SURGERY      FOOT SURGERY  04/25/2016    EXCISION BASE 5TH METATARSAL AND PARTIAL CUBOID LEFT FOOT   HERNIA REPAIR     LEFT INGUINAL AND UMBILICAL REPAIRS   HERNIA REPAIR     I & D EXTREMITY Left 04/25/2016   Procedure: EXCISION BASE 5TH METATARSAL AND PARTIAL CUBOID LEFT FOOT;  Surgeon: Newt Minion, MD;  Location: [facility];  Service: Orthopedics;  Laterality: Left;   ILEOSTOMY  01/13/2018   Procedure: ILEOSTOMY;  Surgeon: Clovis Riley, MD;  Location[facility];  Service: General;;   ILEOSTOMY CLOSURE N/A 05/21/2018   Procedure: ILEOSTOMY REVERSAL ERAS PATHWAY;  Surgeon: Clovis Riley, MD;  Location: [facility];  Service: General;  Laterality: N/A;   IR RADIOLOGIST EVAL & MGMT  11/19/2017   IR RADIOLOGIST EVAL & MGMT  12/03/2017   IR RADIOLOGIST EVAL & MGMT  12/18/2017   JOINT REPLACEMENT Right 2001   Total knee   LAMINECTOMY     X 3 LUMBAR AND X 2 CERVICAL SPINE OPERATIONS   LAPAROSCOPIC CHOLECYSTECTOMY W/ CHOLANGIOGRAPHY  11/09/04   SURGEON DR. [place] CATH AND CORONARY ANGIOGRAPHY N/A 09/16/2018   Procedure: LEFT HEART CATH AND CORONARY ANGIOGRAPHY;  Surgeon: Nigel Mormon, MD;  Location: [facility];  Service: Cardiovascular;  Laterality: N/A;   LEFT HEART CATHETERIZATION WITH CORONARY ANGIOGRAM N/A 10/29/2014   Procedure: LEFT HEART CATHETERIZATION WITH CORONARY ANGIOGRAM;  Surgeon: Laverda Page, MD;  Location[facility];  Service: Cardiovascular;  Laterality: N/A;   LIGATION OF COMPETING BRANCHES OF ARTERIOVENOUS FISTULA Left 07/21/2020   Procedure: LIGATION OF COMPETING BRANCHES OF LEFT UPPER ARM ARTERIOVENOUS FISTULA;  Surgeon: Waynetta Sandy, MD;  Location: [facility];  Service: Vascular;  Laterality: Left;   LOWER EXTREMITY ANGIOGRAM N/A 03/19/2012   Procedure: LOWER EXTREMITY ANGIOGRAM;  Surgeon: Burnell Blanks, MD;  [facility] LAB;  Service: Cardiovascular;  Laterality: N/A;   NECK SURGERY     PARTIAL COLECTOMY N/A 01/13/2018   Procedure:  LAPAROSCOPIC ASSISTED   SIGMOID COLECTOMY ILEOSTOMY;  Surgeon: Clovis Riley, MD;  Location[facility];  Service: General;  Laterality: N/A;   PENILE PROSTHESIS IMPLANT  08/14/05   INFRAPUBIC INSERTION OF INFLATABLE PENILE PROSTHESIS; SURGEON DR. Amalia Hailey   PENILE PROSTHESIS IMPLANT     PERCUTANEOUS CORONARY STENT INTERVENTION (PCI-S) Right 10/29/2014   Procedure: PERCUTANEOUS CORONARY STENT INTERVENTION (PCI-S);  Surgeon: Laverda Page, MD;  Location: [facility];  Service: Cardiovascular;  Laterality: Right;   SHOULDER ARTHROSCOPY     SPINE SURGERY     TOE AMPUTATION Left    TONSILLECTOMY     TOTAL KNEE ARTHROPLASTY  07/2002   RIGHT KNEE ; SURGEON  DR. [name] ALSO HAD ARTHROSCOPIC RIGHT KNEE IN  10/2001   TOTAL KNEE ARTHROPLASTY     ULNAR NERVE TRANSPOSITION Right 10/21/2013   Procedure: RIGHT ELBOW  ULNAR NERVE DECOMPRESSION;  Surgeon: Wynonia Sours, MD;  Location: [facility];  Service: Orthopedics;  Laterality: Right;   Social History   Occupational History   Occupation: Magazine features editor: UNEMPLOYED  Tobacco Use   Smoking status: Former    Packs/day: 2.00    Years: 35.00    Pack years: 70.00    Types: Cigarettes    Quit date: 10/28/2011    Years since quitting: 9.4   Smokeless tobacco: Never  Vaping Use   Vaping Use: Some days   Substances: Nicotine  Substance and Sexual Activity   Alcohol use: Not Currently    Comment: "not in a long time"   Drug use: Never   Sexual activity: Yes    Birth control/protection: Implant    Comment: penile implant

## 2021-04-03 ENCOUNTER — Ambulatory Visit (INDEPENDENT_AMBULATORY_CARE_PROVIDER_SITE_OTHER): Payer: 59 | Admitting: Orthopedic Surgery

## 2021-04-03 DIAGNOSIS — L98421 Non-pressure chronic ulcer of back limited to breakdown of skin: Secondary | ICD-10-CM

## 2021-04-03 DIAGNOSIS — Z89511 Acquired absence of right leg below knee: Secondary | ICD-10-CM

## 2021-04-03 DIAGNOSIS — Z7689 Persons encountering health services in other specified circumstances: Secondary | ICD-10-CM

## 2021-04-03 DIAGNOSIS — L97929 Non-pressure chronic ulcer of unspecified part of left lower leg with unspecified severity: Secondary | ICD-10-CM | POA: Diagnosis not present

## 2021-04-03 DIAGNOSIS — I87332 Chronic venous hypertension (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: Secondary | ICD-10-CM | POA: Diagnosis not present

## 2021-04-04 ENCOUNTER — Encounter: Payer: Self-pay | Admitting: Orthopedic Surgery

## 2021-04-04 NOTE — Progress Notes (Addendum)
Office Visit Note   Patient: [name]           Date of Birth: [birth year]           MRN: [id number] Visit Date: 04/03/2021              Requested by: [name], PA-C [address] [place],  [place] [address] PCP: [name], PA-C  Chief Complaint  Patient presents with   Left Leg - Follow-up      HPI: Patient is a 65 year old gentleman who presents in follow-up for venous ulceration left lower extremity patient has been in a compression wrap.  He currently goes to dialysis.  Patient is also seen for evaluation for mobility examination continue using a powered wheelchair.  Patient is in dialysis 3 times a week and requires a reclining wheelchair to minimize risks of ulceration due to prolonged sitting for dialysis.  Assessment & Plan: Visit Diagnoses:  1. Chronic venous hypertension (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 (HCC)   2. History of right below knee amputation ([place])   3. Sacral ulcer, limited to breakdown of skin ([place])   4. Encounter for power mobility device assessment     Plan: A size 2 XL sock was placed on the left lower extremity this had a good fit.  Patient will change the sock daily.  Follow-Up Instructions: Return in about 4 weeks (around 05/01/2021).   Ortho Exam  Patient is alert, oriented, no adenopathy, well-dressed, normal affect, normal respiratory effort. Examination patient has excellent decreased swelling of the left lower extremity after the compression wrap.  The ulcer over the lateral malleolus has completely healed.  There is no redness no cellulitis no odor no drainage.  Patient's current weight is 272 pounds.  Patient has edema in both lower extremities as well as chronic sacral ulceration.  Patient has difficulty shifting his weight to unload the sacrum due to prolonged immobilization from dialysis.  Patient does have peripheral vascular disease most recent vascular studies shows no obvious  stenosis from the common femoral artery to the popliteal artery however patient does have a abnormal toe brachial indices.  Patient only has biphasic Doppler waveform in the popliteal artery.  Patient does have shortness of breath with attempted repositioning of himself with his upper extremities.  Patient's bilateral upper extremity strength is 3/5 and his left lower extremity strength is 3/5.  Patient is nonambulatory and has had a history of falls with transfers.  Patient does have respiratory compromise and does have sleep apnea.  Patient has a 70-year pack history of tobacco use.  Patient's O2 saturation at rest is 99% but he does have shortness of breath with transfers.  Patient does have anemia of chronic disease with a hemoglobin of 9.1  Patient has no joint contractures.  Patient has multiple medical problems including a right transtibial amputation and toe amputations on the left.  Patient has uncontrolled type 2 diabetes with end-stage renal disease on dialysis he has diabetic retinopathy as well as coronary artery disease and status post angioplasty he is also status post carotid endarterectomies with obstructive sleep apnea unable to tolerate a CPAP machine patient has chronic back and neck pain as well as surgeries in the past.  Patient also has history of coronary angioplasty as well as atrial fibrillation currently on coagulation therapy.  Patient also has hypertension.    Patient uses his motorized wheelchair for all activities of daily living and is occluding meals going  to the bathroom kitchen and bedroom.  Patient does not have the upper extremity strength or balance to use a cane or walker with his amputated leg.  Patient does not have the strength to use a manual wheelchair.  Patient does not have the balance due to lack of postural stability and need for reclining chair for dialysis.  Patient will need the reclining powered wheelchair for activities of daily living within the home  and for community use for dialysis.  Patient has demonstrated the ability to safely operate and use his current powered wheelchair that is broken.  Patient has the need of motivation to continue using a powered wheelchair.    Imaging: No results found. No images are attached to the encounter.  Labs: Lab Results  Component Value Date   HGBA1C 5.7 (H) 09/16/2018   HGBA1C 7.7 (H) 05/15/2018   HGBA1C 7.1 (H) 01/07/2018   ESRSEDRATE 35 (H) 09/03/2016   ESRSEDRATE 111 (H) 05/11/2016   ESRSEDRATE 98 (H) 05/04/2016   CRP 1.2 (H) 09/03/2016   CRP 4.7 (H) 05/11/2016   CRP 8.0 (H) 05/04/2016   REPTSTATUS 11/15/2017 FINAL 11/10/2017   GRAMSTAIN  11/04/2017    ABUNDANT WBC PRESENT, PREDOMINANTLY PMN MODERATE GRAM NEGATIVE RODS MODERATE GRAM POSITIVE COCCI Performed at [facility], [place] [address], [place], [place] [address]    CULT  11/10/2017    NO GROWTH 5 DAYS Performed at [facility] [address], [place], [place] [address]    LABORGA STAPHYLOCOCCUS AUREUS 09/26/2014     Lab Results  Component Value Date   ALBUMIN 3.7 (L) 05/30/2020   ALBUMIN 2.9 (L) 06/19/2019   ALBUMIN 3.5 05/15/2018   PREALBUMIN 19.0 05/04/2016   PREALBUMIN 25.0 12/16/2015    Lab Results  Component Value Date   MG 1.9 05/24/2018   MG 2.1 05/23/2018   MG 1.3 (L) 05/22/2018   No results found for: VD25OH  Lab Results  Component Value Date   PREALBUMIN 19.0 05/04/2016   PREALBUMIN 25.0 12/16/2015   CBC EXTENDED Latest Ref Rng & Units 11/18/2020 10/17/2020 10/03/2020  WBC 4.0 - 10.5 K/uL 7.7 - -  RBC 4.22 - 5.81 MIL/uL 3.21(L) - -  HGB 13.0 - 17.0 g/dL 9.1(L) 9.8(L) 9.5(L)  HCT 39.0 - 52.0 % 28.7(L) - -  PLT 150 - 400 K/uL 149(L) - -  NEUTROABS 1.7 - 7.7 K/uL - - -  LYMPHSABS 0.7 - 4.0 K/uL - - -     There is no height or weight on file to calculate BMI.  Orders:  No orders of the defined types were placed in this encounter.  No orders of the defined types were placed in this  encounter.    Procedures: No procedures performed  Clinical Data: No additional findings.  ROS:  All other systems negative, except as noted in the HPI. Review of Systems  Objective: Vital Signs: There were no vitals taken for this visit.  Specialty Comments:  No specialty comments available.  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   Pseudoclaudication 11/15/2018   Essential hypertension 09/18/2018   S/P  BKA (below knee amputation), right ([place]) 09/18/2018   Amputation of toe of left foot ([place]) 09/18/2018   Peripheral artery disease ([place]) 09/18/2018   S/P carotid endarterectomy 09/18/2018   OSA on CPAP 09/18/2018   Chronic back pain 09/18/2018   Status post reversal of ileostomy 05/21/2018   Normocytic anemia 02/14/2018   Intra-abdominal abscess ([place]) 11/04/2017   Chronic venous hypertension (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 ([place]) 12/11/2016   Unilateral primary osteoarthritis, left knee 10/11/2016   History of right below knee amputation ([place]) 07/12/2016   Diabetic foot infection ([place]) 05/04/2016   Type 2 diabetes mellitus with insulin therapy ([place]) 12/16/2015   Amputated toe ([place]) 10/21/2015   Leg edema, left 09/03/2015   CAD (dz of distal, mid and proximal RCA with implantation of 3 overlapping drug-eluting stent,) 09/03/2015   Chronic diastolic heart failure, NYHA class 2 ([place]) 09/03/2015   Angina pectoris associated with type 2 diabetes mellitus ([place]) 10/28/2014   Hyperlipidemia 08/25/2014   Limb pain  03/20/2013   DJD (degenerative joint disease) 09/25/2012   Migraine 09/25/2012   Neuropathy 09/25/2012   Restless legs syndrome (RLS) 09/25/2012   Chronic obstructive pulmonary disease, unspecified ([place]) 04/25/2012   Unknown cause of morbidity or mortality 04/25/2012   Chronic total occlusion of artery of the extremities ([place]) 04/08/2012   Onychomycosis 02/01/2012   Occlusion and stenosis of carotid artery without mention of cerebral infarction 06/12/2011   GERD (gastroesophageal reflux disease) 05/08/2011   Barrett's esophagus without dysplasia 05/08/2011   Former tobacco use 02/01/2011   Past Medical History:  Diagnosis Date   Carotid artery occlusion 11/10/10   LEFT CAROTID ENDARTERECTOMY   Chronic kidney disease    Complication of anesthesia    BP WENT UP AT [facility] "   COPD (chronic obstructive pulmonary disease) ([place])    pt denies this dx as of 06/01/20 - no inhaler    Diabetes mellitus without complication ([place])    Diverticulitis    Diverticulosis of colon (without mention of hemorrhage)    DJD (degenerative joint disease)    knees/hands/feet/back/neck   Fatty liver    Full dentures    GERD (gastroesophageal reflux disease)    H/O hiatal hernia    History of blood transfusion    with a past surical procedure per patient 06/01/20   Hyperlipidemia    Hypertension    Neuromuscular disorder ([place])    peripheral neuropathy   Non-pressure chronic ulcer of other part of left foot limited to breakdown of skin ([place]) 11/12/2016   Osteomyelitis ([place])    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 Atherectomy left popliteal artery July 2012.    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 Before age 15   Hyperlipidemia Brother    Hypertension Brother    Cancer Sister        Breast   Hyperlipidemia Sister    Hypertension Sister    Hypertension Son    Arthritis Other        GRANDMOTHER   Hypertension Other        OTHER FAMILY MEMBERS    Past Surgical History:  Procedure Laterality Date   AMPUTATION  11/05/2011   Procedure: AMPUTATION RAY;  Surgeon: Wylene Simmer, MD;  Location: [place] [facility];  Service: Orthopedics;  Laterality: Right;  Amputation of Right 4&5th Toes   AMPUTATION Left 11/26/2012   Procedure: AMPUTATION RAY;  Surgeon: Wylene Simmer, MD;  Location: [facility];  Service: Orthopedics;  Laterality: Left;  fourth ray amputation   AMPUTATION Right 08/27/2014   Procedure: Transmetatarsal Amputation;  Surgeon: Newt Minion, MD;  Location: [facility];  Service: Orthopedics;  Laterality: Right;   AMPUTATION Right 01/14/2015   Procedure: AMPUTATION BELOW KNEE;  Surgeon: Newt Minion, MD;  Location: [facility];  Service: Orthopedics;  Laterality: Right;   AMPUTATION Left 10/21/2015   Procedure: Left Foot 5th Ray Amputation;  Surgeon: Newt Minion, MD;  Location: [facility];  Service: Orthopedics;  Laterality: Left;   ANTERIOR FUSION CERVICAL SPINE  02/06/06   C4-5, C5-6, C6-7; SURGEON DR. MAX COHEN   AV FISTULA PLACEMENT Left 06/02/2020   Procedure: ARTERIOVENOUS (AV) FISTULA CREATION LEFT;  Surgeon: Waynetta Sandy, MD;  Location: [facility];  Service: Vascular;  Laterality: Left;   BACK SURGERY     x 3   [place] Left 07/21/2020   Procedure: LEFT UPPER ARM ATERIOVENOUS SUPERFISTULALIZATION;  Surgeon: Waynetta Sandy, MD;  Location: [facility];  Service: Vascular;  Laterality:  Left;   BELOW KNEE LEG AMPUTATION Right    CARDIAC CATHETERIZATION  10/31/04   2009   CAROTID ENDARTERECTOMY  11/10/10   CAROTID ENDARTERECTOMY Left 11/10/2010   Subtotal occlusion of left internal carotid artery with left hemispheric transient ischemic attacks.   CAROTID STENT     CARPAL TUNNEL RELEASE Right 10/21/2013   Procedure: RIGHT CARPAL TUNNEL RELEASE;  Surgeon: Wynonia Sours, MD;  Location: [place];  Service: Orthopedics;  Laterality: Right;   CHOLECYSTECTOMY     COLON SURGERY     COLONOSCOPY     COLOSTOMY REVERSAL  05/21/2018   ileostomy reversal   CYSTOSCOPY WITH STENT PLACEMENT Bilateral 01/13/2018   Procedure: CYSTOSCOPY WITH BILATERAL URETERAL CATHETER PLACEMENT;  Surgeon: Ardis Hughs, MD;  Location: [facility];  Service: Urology;  Laterality: Bilateral;   ESOPHAGEAL MANOMETRY Bilateral 07/19/2014   Procedure: ESOPHAGEAL MANOMETRY (EM);  Surgeon: Jerene Bears, MD;  [facility];  Service: Gastroenterology;  Laterality: Bilateral;   EYE SURGERY Bilateral 2020   cataract   FEMORAL ARTERY STENT     x6   FINGER SURGERY     FOOT SURGERY  04/25/2016    EXCISION BASE 5TH METATARSAL AND PARTIAL CUBOID LEFT FOOT   HERNIA REPAIR     LEFT INGUINAL AND UMBILICAL REPAIRS   HERNIA REPAIR     I & D EXTREMITY Left 04/25/2016   Procedure: EXCISION BASE 5TH METATARSAL AND PARTIAL CUBOID LEFT FOOT;  Surgeon: Newt Minion, MD;  Location: [facility];  Service: Orthopedics;  Laterality: Left;   ILEOSTOMY  01/13/2018   Procedure: ILEOSTOMY;  Surgeon: Clovis Riley, MD;  Location: [facility];  Service: General;;   ILEOSTOMY CLOSURE N/A 05/21/2018   Procedure: ILEOSTOMY REVERSAL ERAS PATHWAY;  Surgeon: Clovis Riley, MD;  Location: [facility];  Service: General;  Laterality: N/A;   IR RADIOLOGIST EVAL & MGMT  11/19/2017   IR RADIOLOGIST EVAL & MGMT  12/03/2017   IR RADIOLOGIST EVAL & MGMT  12/18/2017   JOINT REPLACEMENT Right 2001   Total knee   LAMINECTOMY     X 3 LUMBAR AND  X 2 CERVICAL SPINE OPERATIONS   LAPAROSCOPIC CHOLECYSTECTOMY W/ CHOLANGIOGRAPHY  11/09/04   SURGEON DR. [place] CATH AND CORONARY ANGIOGRAPHY N/A 09/16/2018   Procedure: LEFT HEART CATH AND CORONARY ANGIOGRAPHY;  Surgeon: Nigel Mormon, MD;  Location: [facility];  Service: Cardiovascular;  Laterality: N/A;   LEFT HEART CATHETERIZATION WITH CORONARY ANGIOGRAM N/A 10/29/2014   Procedure: LEFT HEART CATHETERIZATION WITH CORONARY ANGIOGRAM;  Surgeon: Laverda Page, MD;  Location: [facility];  Service: Cardiovascular;  Laterality: N/A;   LIGATION OF COMPETING BRANCHES OF ARTERIOVENOUS FISTULA Left 07/21/2020   Procedure: LIGATION OF COMPETING BRANCHES OF LEFT UPPER ARM ARTERIOVENOUS FISTULA;  Surgeon: Waynetta Sandy, MD;  Location: [facility] [place];  Service: Vascular;  Laterality: Left;   LOWER EXTREMITY ANGIOGRAM N/A 03/19/2012   Procedure: LOWER EXTREMITY ANGIOGRAM;  Surgeon: Burnell Blanks, MD;  Location: [facility];  Service: Cardiovascular;  Laterality: N/A;   NECK SURGERY     PARTIAL COLECTOMY N/A 01/13/2018   Procedure: LAPAROSCOPIC ASSISTED   SIGMOID COLECTOMY ILEOSTOMY;  Surgeon: Clovis Riley, MD;  Location[facility];  Service: General;  Laterality: N/A;   PENILE PROSTHESIS IMPLANT  08/14/05   INFRAPUBIC INSERTION OF INFLATABLE PENILE PROSTHESIS; SURGEON DR. Amalia Hailey   PENILE PROSTHESIS IMPLANT     PERCUTANEOUS CORONARY STENT INTERVENTION (PCI-S) Right 10/29/2014   Procedure: PERCUTANEOUS CORONARY STENT INTERVENTION (PCI-S);  Surgeon: Laverda Page, MD;  Location: [facility];  Service: Cardiovascular;  Laterality: Right;   SHOULDER ARTHROSCOPY     SPINE SURGERY     TOE AMPUTATION Left    TONSILLECTOMY     TOTAL KNEE ARTHROPLASTY  07/2002   RIGHT KNEE ; SURGEON  DR. [name] ALSO HAD ARTHROSCOPIC RIGHT KNEE IN  10/2001   TOTAL KNEE ARTHROPLASTY     ULNAR NERVE TRANSPOSITION Right 10/21/2013   Procedure: RIGHT ELBOW  ULNAR NERVE DECOMPRESSION;   Surgeon: Wynonia Sours, MD;  Location: [facility];  Service: Orthopedics;  Laterality: Right;   Social History   Occupational History   Occupation: Magazine features editor: UNEMPLOYED  Tobacco Use   Smoking status: Former    Packs/day: 2.00    Years: 35.00    Pack years: 70.00    Types: Cigarettes    Quit date: 10/28/2011    Years since quitting: 9.5   Smokeless tobacco: Never  Vaping Use   Vaping Use: Some days   Substances: Nicotine  Substance and Sexual Activity   Alcohol use: Not Currently    Comment: "not in a long time"   Drug use: Never   Sexual activity: Yes    Birth control/protection: Implant    Comment: penile implant

## 2021-04-10 ENCOUNTER — Ambulatory Visit: Payer: 59 | Admitting: Student

## 2021-04-10 ENCOUNTER — Other Ambulatory Visit: Payer: Self-pay

## 2021-04-10 ENCOUNTER — Telehealth: Payer: Self-pay

## 2021-04-10 ENCOUNTER — Encounter: Payer: Self-pay | Admitting: Student

## 2021-04-10 VITALS — BP 121/52 | HR 80 | Ht 74.0 in | Wt 253.0 lb

## 2021-04-10 DIAGNOSIS — I10 Essential (primary) hypertension: Secondary | ICD-10-CM

## 2021-04-10 DIAGNOSIS — N186 End stage renal disease: Secondary | ICD-10-CM

## 2021-04-10 DIAGNOSIS — I480 Paroxysmal atrial fibrillation: Secondary | ICD-10-CM

## 2021-04-10 DIAGNOSIS — I4891 Unspecified atrial fibrillation: Secondary | ICD-10-CM

## 2021-04-10 NOTE — Telephone Encounter (Signed)
Please have the EKG sent to [place] and arrange OV with one of [place] today or tomorrow

## 2021-04-10 NOTE — Telephone Encounter (Signed)
Patient is scheduled for 8/01 with [facility] pcp office is sending over ekg

## 2021-04-10 NOTE — Progress Notes (Signed)
Primary Physician/Referring:  [name], PA-C  Patient ID: [name], male    DOB: [birth year], 65 y.o.   MRN: [id number]  Chief Complaint  Patient presents with   Atrial Fibrillation   Follow-up   HPI:    [name]  is a 65 y.o. male  with severe peripheral arterial disease and has history of right below-knee amputation and toe amputations on the left, multiple peripheral interventions in the past, uncontrolled diabetes mellitus, ESRD, severe diabetic retinopathy, coronary artery disease angioplasty to the right coronary artery on 10/29/2014, right carotid endarterectomy in 2012 , OSA Unable to tolerate CPAP, chronic back pain and neck and back surgery in past and on chronic pain medications. Coronary Angiography 09/17/2018 revealed widely patent stent and no significant new disease.    Patient presents for urgent visit PCP office 3 days ago and during cardiac auscultation PCP noted irregular rhythm, therefore did EKG which revealed atrial fibrillation with controlled ventricular response.  PCP started patient on Eliquis given high thromboembolic risk.  Patient is also concerned as his blood pressure following dialysis is dropping as low as <500 mmHg systolic.  Since dizziness and nausea.  Presently patient holds antihypertensive medications on the morning of dialysis, however he takes his blood pressure medicines following dialysis appointment.  Otherwise patient's bilateral lower leg edema has significantly improved.  Unfortunately he continues to use e-cigarettes regularly.  Past Medical History:  Diagnosis Date   Carotid artery occlusion 11/10/10   LEFT CAROTID ENDARTERECTOMY   Chronic kidney disease    Complication of anesthesia    BP WENT UP AT [facility] "   COPD (chronic obstructive pulmonary disease) ([place])    pt denies this dx as of 06/01/20 - no inhaler    Diabetes mellitus without complication ([place])    Diverticulitis    Diverticulosis of colon (without mention of hemorrhage)     DJD (degenerative joint disease)    knees/hands/feet/back/neck   Fatty liver    Full dentures    GERD (gastroesophageal reflux disease)    H/O hiatal hernia    History of blood transfusion    with a past surical procedure per patient 06/01/20   Hyperlipidemia    Hypertension    Neuromuscular disorder ([place])    peripheral neuropathy   Non-pressure chronic ulcer of other part of left foot limited to breakdown of skin ([place]) 11/12/2016   Osteomyelitis (HCC)    left 5th metatarsal   PAD (peripheral artery disease) ([place])    Distal aortogram June 2012. Atherectomy left popliteal artery July 2012.    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   Unstable angina ([place]) 09/16/2018   Wears glasses    Past Surgical History:  Procedure Laterality Date   AMPUTATION  11/05/2011   Procedure: AMPUTATION RAY;  Surgeon: Wylene Simmer, MD;  Location: [facility];  Service: Orthopedics;  Laterality: Right;  Amputation of Right 4&5th Toes   AMPUTATION Left 11/26/2012   Procedure: AMPUTATION RAY;  Surgeon: Wylene Simmer, MD;  Location: [place];  Service: Orthopedics;  Laterality: Left;  fourth ray amputation   AMPUTATION Right 08/27/2014   Procedure: Transmetatarsal Amputation;  Surgeon: Newt Minion, MD;  Location: [facility];  Service: Orthopedics;  Laterality: Right;   AMPUTATION Right 01/14/2015   Procedure: AMPUTATION BELOW KNEE;  Surgeon: Newt Minion, MD;  Location: [facility];  Service: Orthopedics;  Laterality: Right;   AMPUTATION Left 10/21/2015   Procedure: Left Foot 5th Ray Amputation;  Surgeon: Newt Minion, MD;  Location: [facility];  Service: Orthopedics;  Laterality: Left;   ANTERIOR FUSION CERVICAL SPINE  02/06/06   C4-5, C5-6, C6-7; SURGEON DR. MAX COHEN   AV FISTULA PLACEMENT Left 06/02/2020   Procedure: ARTERIOVENOUS (AV) FISTULA CREATION LEFT;  Surgeon: Waynetta Sandy, MD;  Location: [facility];  Service: Vascular;  Laterality: Left;   BACK SURGERY     x 3   [place] Left 07/21/2020   Procedure: LEFT UPPER ARM ATERIOVENOUS SUPERFISTULALIZATION;  Surgeon: Waynetta Sandy, MD;  Location: [place];  Service: Vascular;  Laterality: Left;   BELOW KNEE LEG AMPUTATION Right    CARDIAC CATHETERIZATION  10/31/04   2009   CAROTID ENDARTERECTOMY  11/10/10   CAROTID ENDARTERECTOMY Left 11/10/2010   Subtotal occlusion of left internal carotid artery with left hemispheric transient ischemic attacks.   CAROTID STENT     CARPAL TUNNEL RELEASE Right 10/21/2013   Procedure: RIGHT CARPAL TUNNEL RELEASE;  Surgeon: Wynonia Sours, MD;  Location: [place];  Service: Orthopedics;  Laterality: Right;   CHOLECYSTECTOMY     COLON SURGERY     COLONOSCOPY     COLOSTOMY REVERSAL  05/21/2018   ileostomy reversal   CYSTOSCOPY WITH STENT PLACEMENT Bilateral 01/13/2018   Procedure: CYSTOSCOPY WITH BILATERAL URETERAL CATHETER PLACEMENT;  Surgeon: Ardis Hughs, MD;  Location[facility];  Service: Urology;  Laterality: Bilateral;   ESOPHAGEAL MANOMETRY Bilateral 07/19/2014   Procedure: ESOPHAGEAL MANOMETRY (EM);  Surgeon: Jerene Bears, MD;  [facility];  Service: Gastroenterology;  Laterality: Bilateral;   EYE SURGERY Bilateral 2020   cataract   FEMORAL ARTERY STENT     x6   FINGER SURGERY     FOOT SURGERY  04/25/2016    EXCISION BASE 5TH METATARSAL AND PARTIAL CUBOID LEFT FOOT   HERNIA REPAIR     LEFT INGUINAL AND UMBILICAL REPAIRS   HERNIA REPAIR     I & D EXTREMITY Left 04/25/2016   Procedure: EXCISION BASE 5TH METATARSAL AND PARTIAL CUBOID LEFT FOOT;  Surgeon: Newt Minion, MD;  Location: [facility];  Service: Orthopedics;  Laterality: Left;   ILEOSTOMY  01/13/2018   Procedure: ILEOSTOMY;  Surgeon: Clovis Riley, MD;  Location[facility];  Service: General;;   ILEOSTOMY CLOSURE N/A 05/21/2018   Procedure: ILEOSTOMY REVERSAL ERAS PATHWAY;  Surgeon: Clovis Riley, MD;  Location: [facility];  Service: General;  Laterality: N/A;   IR RADIOLOGIST EVAL & MGMT  11/19/2017   IR RADIOLOGIST EVAL & MGMT  12/03/2017   IR RADIOLOGIST EVAL & MGMT  12/18/2017   JOINT REPLACEMENT Right 2001   Total knee   LAMINECTOMY     X 3 LUMBAR AND X 2 CERVICAL SPINE OPERATIONS   LAPAROSCOPIC CHOLECYSTECTOMY W/ CHOLANGIOGRAPHY  11/09/04   SURGEON DR. [place] CATH AND CORONARY ANGIOGRAPHY N/A 09/16/2018   Procedure: LEFT HEART CATH AND CORONARY ANGIOGRAPHY;  Surgeon: Nigel Mormon, MD;  Location: [facility];  Service: Cardiovascular;  Laterality: N/A;   LEFT HEART CATHETERIZATION WITH CORONARY ANGIOGRAM N/A 10/29/2014   Procedure: LEFT HEART CATHETERIZATION WITH CORONARY ANGIOGRAM;  Surgeon: Laverda Page, MD;  Location: [facility] C[facility];  Service: Cardiovascular;  Laterality: N/A;   LIGATION OF COMPETING BRANCHES OF ARTERIOVENOUS FISTULA Left 07/21/2020  Procedure: LIGATION OF COMPETING BRANCHES OF LEFT UPPER ARM ARTERIOVENOUS FISTULA;  Surgeon: Waynetta Sandy, MD;  Location: [place];  Service: Vascular;  Laterality: Left;   LOWER EXTREMITY ANGIOGRAM N/A 03/19/2012   Procedure: LOWER EXTREMITY ANGIOGRAM;  Surgeon: Burnell Blanks, MD;  Location: [facility] C[facility] LAB;  Service: Cardiovascular;  Laterality: N/A;   NECK SURGERY     PARTIAL COLECTOMY N/A 01/13/2018   Procedure: LAPAROSCOPIC ASSISTED   SIGMOID COLECTOMY ILEOSTOMY;  Surgeon: Clovis Riley, MD;  [facility];  Service: General;  Laterality: N/A;   PENILE PROSTHESIS IMPLANT  08/14/05   INFRAPUBIC INSERTION OF INFLATABLE PENILE PROSTHESIS; SURGEON DR. Amalia Hailey   PENILE PROSTHESIS IMPLANT     PERCUTANEOUS CORONARY STENT INTERVENTION (PCI-S) Right 10/29/2014   Procedure: PERCUTANEOUS CORONARY STENT INTERVENTION (PCI-S);  Surgeon: Laverda Page, MD;  Location: [facility] CATH LAB;  Service: Cardiovascular;  Laterality: Right;   SHOULDER ARTHROSCOPY     SPINE SURGERY     TOE AMPUTATION  Left    TONSILLECTOMY     TOTAL KNEE ARTHROPLASTY  07/2002   RIGHT KNEE ; SURGEON  DR. [name] ALSO HAD ARTHROSCOPIC RIGHT KNEE IN  10/2001   TOTAL KNEE ARTHROPLASTY     ULNAR NERVE TRANSPOSITION Right 10/21/2013   Procedure: RIGHT ELBOW  ULNAR NERVE DECOMPRESSION;  Surgeon: Wynonia Sours, MD;  Location: [place];  Service: Orthopedics;  Laterality: Right;   Family History  Problem Relation Age of Onset   Heart disease Father        Before age 64-  CAD, BPG   Diabetes Father        Amputation   Cancer Father        PROSTATE   Hyperlipidemia Father    Hypertension Father    Heart attack Father        Triple BPG   Varicose Veins Father    Colon cancer Brother    Diabetes Brother    Heart disease Brother 24       A-Fib. Before age 29   Hyperlipidemia Brother    Hypertension Brother    Cancer Sister        Breast   Hyperlipidemia Sister    Hypertension Sister    Hypertension Son    Arthritis Other        GRANDMOTHER   Hypertension Other        OTHER FAMILY MEMBERS   Social History   Tobacco Use   Smoking status: Former    Packs/day: 2.00    Years: 35.00    Pack years: 70.00    Types: Cigarettes    Quit date: 10/28/2011    Years since quitting: 9.4   Smokeless tobacco: Never  Substance Use Topics   Alcohol use: Not Currently    Comment: "not in a long time"   Marital Status: Married   ROS  Review of Systems  Cardiovascular:  Positive for leg swelling (improved). Negative for chest pain, dyspnea on exertion, palpitations and syncope.  Hematologic/Lymphatic: Does not bruise/bleed easily.  Musculoskeletal:  Positive for arthritis and back pain.  Gastrointestinal:  Positive for nausea (after dialysis). Negative for melena.  Neurological:  Positive for dizziness (after dialysis).  Objective   Vitals with BMI 04/10/2021 03/20/2021 12/12/2020  Height 6\' 2"  6\' 2"  6\' 2"   Weight 253 lbs 272 lbs 272 lbs  BMI 32.47 70.78 67.54  Systolic 492 - -  Diastolic 52  - -  Pulse 80 - -  Blood pressure (!) 121/52, pulse 80, height 6\' 2"  (1.88 m), weight 253 lb (114.8 kg), SpO2 97 %. Body mass index is 32.48 kg/m.   Physical Exam Constitutional:      General: He is not in acute distress.    Comments: Well built and moderately obese in no acute distress  Cardiovascular:     Rate and Rhythm: Normal rate and regular rhythm.     Pulses: Intact distal pulses.          Carotid pulses are  on the right side with bruit and  on the left side with bruit.      Femoral pulses are 1+ on the right side with bruit and 1+ on the left side with bruit.      Popliteal pulses are 0 on the right side and 0 on the left side.        Right dorsalis pedis pulse not accessible and left dorsalis pedis pulse not accessible.        Right posterior tibial pulse not accessible and left posterior tibial pulse not accessible.     Heart sounds: Normal heart sounds. No murmur heard.   No gallop.     Comments: Right BKA. No JVD Left brachial artery AV Shunt noted Pulmonary:     Effort: Pulmonary effort is normal.     Breath sounds: Normal breath sounds.  Musculoskeletal:     Cervical back: Neck supple.     Right lower leg: Edema (minimal) present.     Left lower leg: Edema (minimal) present.   Laboratory examination:   Recent Labs    05/30/20 1646 06/02/20 0627 07/21/20 0721 11/18/20 1831  NA 139 139 140 138  K 4.0 3.5 4.0 4.2  CL 106 106 107 108  CO2 19*  --   --  23  GLUCOSE 226* 130* 163* 80  BUN 27 32* 38* 45*  CREATININE 4.28* 5.10* 4.90* 4.71*  CALCIUM 7.9*  --   --  8.2*  GFRNONAA 14*  --   --  13*  GFRAA 16*  --   --   --    CMP Latest Ref Rng & Units 11/18/2020 07/21/2020 06/02/2020  Glucose 70 - 99 mg/dL 80 163(H) 130(H)  BUN 8 - 23 mg/dL 45(H) 38(H) 32(H)  Creatinine 0.61 - 1.24 mg/dL 4.71(H) 4.90(H) 5.10(H)  Sodium 135 - 145 mmol/L 138 140 139  Potassium 3.5 - 5.1 mmol/L 4.2 4.0 3.5  Chloride 98 - 111 mmol/L 108 107 106  CO2 22 - 32 mmol/L 23 - -   Calcium 8.9 - 10.3 mg/dL 8.2(L) - -  Total Protein 6.0 - 8.5 g/dL - - -  Total Bilirubin 0.0 - 1.2 mg/dL - - -  Alkaline Phos 44 - 121 IU/L - - -  AST 0 - 40 IU/L - - -  ALT 0 - 44 IU/L - - -   CBC Latest Ref Rng & Units 11/18/2020 10/17/2020 10/03/2020  WBC 4.0 - 10.5 K/uL 7.7 - -  Hemoglobin 13.0 - 17.0 g/dL 9.1(L) 9.8(L) 9.5(L)  Hematocrit 39.0 - 52.0 % 28.7(L) - -  Platelets 150 - 400 K/uL 149(L) - -   Lipid Panel     Component Value Date/Time   CHOL 117 05/30/2020 1646   TRIG 133 05/30/2020 1646   HDL 24 (L) 05/30/2020 1646   LDLCALC 69 05/30/2020 1646   HEMOGLOBIN A1C Lab Results  Component Value Date   HGBA1C 5.7 (H) 09/16/2018   MPG 116.89 09/16/2018  External labs:  A1C 6.100 % 03/30/2020   Hemoglobin 10.800 05/23/2020 Platelets 203.000 x1 05/02/2020  Creatinine, Serum 4.140 mg/ 05/02/2020 Potassium 4.300 mm 05/02/2020 ALT (SGPT) 16.000 IU/ 05/02/2020  Cholesterol, total 142.000 m 08/19/2018 HDL 32.000 mg 08/19/2018 LDL 79.000 mg 08/19/2018 Triglycerides 156.000 m 08/19/2018  A1C 6.900 % 09/02/2019  Creatinine, Serum 3.590 mg/ 10/05/2019 Potassium 4.300 mm 10/05/2019 ALT (SGPT) 12.000 IU/ 10/05/2019  Hemoglobin 9.700 11/09/2019; INR 1.000 01/07/2018 Platelets 232.000 x1 10/05/2019   Allergies   Allergies  Allergen Reactions   Contrast Media [Iodinated Diagnostic Agents]     Difficulty breathing     Ivp Dye [Iodinated Diagnostic Agents] Anaphylaxis and Other (See Comments)    Breathing problems, altered mental state    Adhesive [Tape] Rash    Rash after 3-4 days use      Medications Prior to Visit:   Outpatient Medications Prior to Visit  Medication Sig Dispense Refill   apixaban (ELIQUIS) 5 MG TABS tablet Take 5 mg by mouth 2 (two) times daily.     atorvastatin (LIPITOR) 40 MG tablet Take 40 mg by mouth at bedtime.      BIDIL 20-37.5 MG tablet Take 1 tablet by mouth 3 (three) times daily. Do not take on dialysis days (Tuesday, Thursday, Saturday)       carvedilol (COREG) 25 MG tablet Take 1.5 tablets (37.5 mg total) by mouth 2 (two) times daily. (Patient taking differently: Take 25 mg by mouth 2 (two) times daily.) 270 tablet 2   fentaNYL (DURAGESIC - DOSED MCG/HR) 50 MCG/HR Place 50 mcg onto the skin every 3 (three) days.     Glucosamine HCl (GLUCOSAMINE PO) Take 100 tablets by mouth 2 (two) times daily.     HUMALOG KWIKPEN 100 UNIT/ML KiwkPen Inject 15-16 Units into the skin See admin instructions. Inject 13 units before breakfast, lunch, & dinner--  0   insulin glargine (LANTUS) 100 UNIT/ML injection Inject 55 Units into the skin at bedtime.     ketoconazole (NIZORAL) 2 % cream Apply 1 application topically 2 (two) times daily as needed for irritation.   1   linaclotide (LINZESS) 145 MCG CAPS capsule Take 145 mcg by mouth daily as needed (constipation).      loratadine (CLARITIN) 10 MG tablet Take 10 mg by mouth daily.     NEEDLE, REUSABLE, 22 G 22G X 1-1/2" MISC 1 Units by Does not apply route as directed. For B12 IM inj 10 each 0   nitroGLYCERIN (NITROSTAT) 0.4 MG SL tablet Place 0.4 mg under the tongue every 5 (five) minutes as needed for chest pain.     omega-3 acid ethyl esters (LOVAZA) 1 g capsule Take 2 g by mouth 2 (two) times daily.     ondansetron (ZOFRAN) 4 MG tablet Take 4 mg by mouth every 6 (six) hours as needed for nausea or vomiting.     oxyCODONE-acetaminophen (PERCOCET) 7.5-325 MG tablet Take 1 tablet by mouth 3 (three) times daily as needed.     pantoprazole (PROTONIX) 40 MG tablet Take 40 mg by mouth daily.     Polyvinyl Alcohol-Povidone PF 1.4-0.6 % SOLN Place 1 drop into both eyes 2 (two) times daily as needed (dry eyes).      pregabalin (LYRICA) 100 MG capsule Take 1 capsule (100 mg total) by mouth 3 (three) times daily. 30 capsule 0   rOPINIRole (REQUIP) 2 MG tablet Take 2 mg by mouth at bedtime.     Syringe/Needle, Disp, (SYRINGE 3CC/22GX1-1/2") 22G X 1-1/2" 3  ML MISC 1 Syringe by Does not apply route as directed.  For b12 IM inj 10 each 0   tamsulosin (FLOMAX) 0.4 MG CAPS capsule Take 0.4 mg by mouth daily.     torsemide (DEMADEX) 20 MG tablet Take 20 mg by mouth in the morning and at bedtime. Take on extra for swelling     aspirin EC 81 MG tablet Take 81 mg by mouth daily.     clobetasol (TEMOVATE) 0.05 % external solution Apply 1 application topically daily as needed (dry scalp).     clopidogrel (PLAVIX) 75 MG tablet Take 75 mg by mouth daily.      fluticasone (CUTIVATE) 0.05 % cream Apply 1 application topically 2 (two) times daily as needed (dry skin on face).      B Complex-C-Zn-Folic Acid (DIALYVITE/ZINC) TABS Take 1 tablet by mouth daily.     amLODipine (NORVASC) 10 MG tablet Take 1 tablet (10 mg total) by mouth daily. (Patient not taking: Reported on 04/10/2021) 90 tablet 1   ferrous gluconate (FERGON) 324 MG tablet Take 324 mg by mouth 2 (two) times daily.     folic acid (FOLVITE) 1 MG tablet Take 1 mg by mouth daily.     Multiple Vitamin (MULTIVITAMIN) tablet Take 1 tablet by mouth daily.     No facility-administered medications prior to visit.     Final Medications at End of Visit    Current Meds  Medication Sig   apixaban (ELIQUIS) 5 MG TABS tablet Take 5 mg by mouth 2 (two) times daily.   atorvastatin (LIPITOR) 40 MG tablet Take 40 mg by mouth at bedtime.    BIDIL 20-37.5 MG tablet Take 1 tablet by mouth 3 (three) times daily. Do not take on dialysis days (Tuesday, Thursday, Saturday)    carvedilol (COREG) 25 MG tablet Take 1.5 tablets (37.5 mg total) by mouth 2 (two) times daily. (Patient taking differently: Take 25 mg by mouth 2 (two) times daily.)   fentaNYL (DURAGESIC - DOSED MCG/HR) 50 MCG/HR Place 50 mcg onto the skin every 3 (three) days.   Glucosamine HCl (GLUCOSAMINE PO) Take 100 tablets by mouth 2 (two) times daily.   HUMALOG KWIKPEN 100 UNIT/ML KiwkPen Inject 15-16 Units into the skin See admin instructions. Inject 13 units before breakfast, lunch, & dinner--   insulin  glargine (LANTUS) 100 UNIT/ML injection Inject 55 Units into the skin at bedtime.   ketoconazole (NIZORAL) 2 % cream Apply 1 application topically 2 (two) times daily as needed for irritation.    linaclotide (LINZESS) 145 MCG CAPS capsule Take 145 mcg by mouth daily as needed (constipation).    loratadine (CLARITIN) 10 MG tablet Take 10 mg by mouth daily.   NEEDLE, REUSABLE, 22 G 22G X 1-1/2" MISC 1 Units by Does not apply route as directed. For B12 IM inj   nitroGLYCERIN (NITROSTAT) 0.4 MG SL tablet Place 0.4 mg under the tongue every 5 (five) minutes as needed for chest pain.   omega-3 acid ethyl esters (LOVAZA) 1 g capsule Take 2 g by mouth 2 (two) times daily.   ondansetron (ZOFRAN) 4 MG tablet Take 4 mg by mouth every 6 (six) hours as needed for nausea or vomiting.   oxyCODONE-acetaminophen (PERCOCET) 7.5-325 MG tablet Take 1 tablet by mouth 3 (three) times daily as needed.   pantoprazole (PROTONIX) 40 MG tablet Take 40 mg by mouth daily.   Polyvinyl Alcohol-Povidone PF 1.4-0.6 % SOLN Place 1 drop into both eyes 2 (two) times daily as  needed (dry eyes).    pregabalin (LYRICA) 100 MG capsule Take 1 capsule (100 mg total) by mouth 3 (three) times daily.   rOPINIRole (REQUIP) 2 MG tablet Take 2 mg by mouth at bedtime.   Syringe/Needle, Disp, (SYRINGE 3CC/22GX1-1/2") 22G X 1-1/2" 3 ML MISC 1 Syringe by Does not apply route as directed. For b12 IM inj   tamsulosin (FLOMAX) 0.4 MG CAPS capsule Take 0.4 mg by mouth daily.   torsemide (DEMADEX) 20 MG tablet Take 20 mg by mouth in the morning and at bedtime. Take on extra for swelling   [DISCONTINUED] aspirin EC 81 MG tablet Take 81 mg by mouth daily.   [DISCONTINUED] clobetasol (TEMOVATE) 0.05 % external solution Apply 1 application topically daily as needed (dry scalp).   [DISCONTINUED] clopidogrel (PLAVIX) 75 MG tablet Take 75 mg by mouth daily.    [DISCONTINUED] fluticasone (CUTIVATE) 0.05 % cream Apply 1 application topically 2 (two) times daily  as needed (dry skin on face).    Cardiac Studies:   Left popliteal artery atherectomy in June 2012 and left below knee PTA 2014 by Dr. Brunetta Jeans at [facility].   Lexiscan sestamibi stress test 03/18/2017: 1. The resting electrocardiogram demonstrated normal sinus rhythm, RBBB and no resting arrhythmias. Stress EKG is non-diagnostic for ischemia as it a pharmacologic stress using Lexiscan. Occasional PVC noted. Stress symptoms included dyspnea, dizziness and chest pain. 2. The LV is dilated both at rest and stress images. The LV end diastolic volume was 202RK. Perfusion images reveal inferior soft tissue attenuation artifact. There is no ischemia or scar. Overall left ventricular systolic function was normal without regional wall motion abnormalities. The left ventricular ejection fraction was calculated to be 51%. This is a low risk study. Compared to the study done on 10/15/2014, inferior wall ischemia and associated inferior hypokinesis is no longer present.  Coronary angiogram 09/17/18: Widely patent stent stents. Normal LVEF. Coronary Angiogram 10/29/2014: CAD s/p stenting of the distal, mid and proximal RCA with implantation of 3 overlapping drug-eluting stent, from distal to proximal 2.5 x 38 mm, 2.75 x 38 mm and a 2.75 x 20 mm Promus premier DES.   Echocardiogram 09/16/2018:   Left ventricle: The cavity size was normal. There was moderate concentric hypertrophy. Systolic function was normal. The estimated ejection fraction was in the range of 55% to 60%. Features are consistent with a pseudonormal left ventricular filling pattern, with concomitant abnormal relaxation and increased filling pressure (grade 2 diastolic dysfunction). Doppler parameters are consistent with both elevated ventricular end-diastolic filling pressure and elevated left atrial filling pressure. Mitral valve: Calcified annulus.  Left atrium: The atrium was mildly dilated. Inferior vena cava: The vessel was dilated. The respirophasic  diameter changes was normal, may suggest elevated central venous pressure.  Carotid artery duplex  06/09/2019:  Minimal stenosis in bilateral ICA of 1-15%. Heterogeneous plaque. Stenosis  in the right external carotid artery (<50%).  Left carotid endarterectomy site is patent.  Right vertebral artery flow is not well visualized. Antegrade left  vertebral artery flow.  No significant change from 03/01/2017. Follow up studies when clinically  Indicated.   EKG   04/29/2021: Sinus rhythm at a rate of 79 bpm.  Left atrial enlargement.  Left axis, left anterior fascicular block.  Right bundle branch block.  Bifascicular block.  LVH.   External EKG 04/06/2021: Atrial fibrillation with controlled ventricular response at a rate of 102 bpm.   EKG normal 12/12/2020: Sinus bradycardia at rate of 58 bpm with first-degree AV  block, left axis deviation, left anterior fascicular block.  Right bundle branch block.  Borderline criteria for LVH by voltage.  No change from 05/27/2020.  Assessment     ICD-10-CM   1. Paroxysmal atrial fibrillation (HCC)  I48.0 EKG 12-Lead    2. Essential hypertension  I10     3. ESRD on hemodialysis ([place])  N18.6    Z99.2      No orders of the defined types were placed in this encounter.  Medications Discontinued During This Encounter  Medication Reason   amLODipine (NORVASC) 10 MG tablet Error   ferrous gluconate (FERGON) 324 MG tablet Error   folic acid (FOLVITE) 1 MG tablet Error   clobetasol (TEMOVATE) 0.05 % external solution Error   fluticasone (CUTIVATE) 0.05 % cream Error   Multiple Vitamin (MULTIVITAMIN) tablet Error   aspirin EC 81 MG tablet Discontinued by provider   clopidogrel (PLAVIX) 75 MG tablet Discontinued by provider    This patients CHA2DS2-VASc Score 5 (CHF, HTN, DM, PAD, Age) and yearly risk of stroke 7.2%.   Recommendations:   SHERYL TOWELL  is a 65 y.o. male  with severe peripheral arterial disease and has history of right below-knee  amputation and toe lamputations on the left, multiple peripheral interventions in the past, uncontrolled diabetes mellitus, stage IV CKD, coronary artery disease angioplasty to the right coronary artery on 10/29/2014, right carotid endarterectomy in 2012 , OSA Unable to tolerate CPAP, chronic back pain and neck and back surgery in past and on chronic pain medications.   Personally reviewed external records, EKG from PCP is indeed atrial fibrillation with controlled ventricular response.  EKG today revealed patient is in sinus rhythm.  Given patient's CHA2DS2-VASc score of 5 counseled him regarding indications, risks, benefits of anticoagulation.  Patient verbalized understanding and wishes to proceed with anticoagulation, therefore we will continue Eliquis at this time.  Counseled patient regarding precautions given long-term anticoagulation.  Discussed at length with patient as well as his wife and son regarding physiology of atrial fibrillation as well as patient's multiple risk factors for recurrent episodes of A. fib. Given patient multiple risk factors for a fib discussed additional workup with [name] who will follow up at upcoming visit.   In regard to episodes of hypotension following dialysis, advised patient to continue to hold all antihypertensive medications on the mornings of dialysis, as well as do not take BiDil at all on dialysis days, patient verbalized understanding and agreement.  He will continue to monitor his blood pressure closely.  Patient remains wheelchair-bound.  Again discussed with patient and his wife the option of palliative care consult, they prefer to hold off at this time.    Will keep previously scheduled follow-up appointment in 2 months with [name].  Patient was seen in collaboration with [name]. He also reviewed patient's chart and examined the patient. [name] is in agreement of the plan.    Alethia Berthold, PA-C 04/11/2021, 1:14 PM Office:  [phone]

## 2021-04-20 ENCOUNTER — Telehealth: Payer: Self-pay | Admitting: Orthopedic Surgery

## 2021-04-20 NOTE — Telephone Encounter (Signed)
Patient's wife [name] called asked if the forms were completed and faxed back to [facility]. [name] said [employer] is waiting to receive the forms. [name] said she is willing to pick the forms herself and get the forms back to [facility] if she need to do that. [name] asked for a call back either way. [name] asked if she can receive a copy of the forms for her records? The number to contact [name] is [phone]

## 2021-04-21 NOTE — Telephone Encounter (Signed)
Called and sw pt's wife and office note that [employer] is requesting talks about a reclining chair not a [place]. I will set aside paperwork for addendum to be made to 02/10/21 note and have [name] to sign the 7 element order on Monday and clal to let her know when it has been faxed.

## 2021-04-25 NOTE — Telephone Encounter (Signed)
Addendum made, chart notes printed and rx written holding for signature will fax this afternoon.

## 2021-04-25 NOTE — Telephone Encounter (Signed)
I called and lm on vm to advise that the information has been faxed and to call with any questions.

## 2021-04-27 ENCOUNTER — Telehealth: Payer: Self-pay

## 2021-04-27 NOTE — Telephone Encounter (Signed)
[name] called wanting a call back regarding a the [employer] forms. She stated that they are sending them back due to them needing more information

## 2021-04-28 NOTE — Telephone Encounter (Signed)
Received fax from [employer] and have this set aside for [name] to include all the information they are requesting into his note.

## 2021-04-29 ENCOUNTER — Telehealth: Payer: Self-pay | Admitting: Student

## 2021-04-29 DIAGNOSIS — Z992 Dependence on renal dialysis: Secondary | ICD-10-CM

## 2021-04-29 DIAGNOSIS — I10 Essential (primary) hypertension: Secondary | ICD-10-CM

## 2021-04-29 DIAGNOSIS — N186 End stage renal disease: Secondary | ICD-10-CM

## 2021-04-30 NOTE — Telephone Encounter (Signed)
ON-CALL CARDIOLOGY 04/29/2021  Patient's name: [name].   MRN: [id number].    DOB: 1955/12/21 Primary care provider: [name], PA-C. Primary cardiologist: [name]   Interaction regarding this patient's care today: Patient's wife called and reported that the patient is presently at dialysis and his blood pressure is low. However she is unable to provide blood pressure readings. Patient is experiencing lightheadedness. He was experiencing similar issues at his last visit in our office and was advised to hold his antihypertensives on dialysis days.   Impression:   ICD-10-CM   1. ESRD on hemodialysis (HCC)  N18.6    Z99.2     2. Essential hypertension  I10       No orders of the defined types were placed in this encounter.   No orders of the defined types were placed in this encounter.   Recommendations: Advised patient to follow instructions from dialysis providers today and continue to hold antihypertensive medications on dialysis days. Will set patient up for office visit to discuss further.   Telephone encounter total time: 6 minutes     Alethia Berthold, PA-C 04/30/2021, 7:52 PM Office: [phone]

## 2021-05-01 ENCOUNTER — Encounter (INDEPENDENT_AMBULATORY_CARE_PROVIDER_SITE_OTHER): Payer: Self-pay | Admitting: Ophthalmology

## 2021-05-01 ENCOUNTER — Ambulatory Visit: Payer: 59 | Admitting: Student

## 2021-05-01 ENCOUNTER — Encounter: Payer: Self-pay | Admitting: Student

## 2021-05-01 ENCOUNTER — Other Ambulatory Visit: Payer: Self-pay

## 2021-05-01 ENCOUNTER — Ambulatory Visit: Payer: 59 | Admitting: Orthopedic Surgery

## 2021-05-01 ENCOUNTER — Ambulatory Visit (INDEPENDENT_AMBULATORY_CARE_PROVIDER_SITE_OTHER): Payer: 59 | Admitting: Ophthalmology

## 2021-05-01 VITALS — BP 151/72 | HR 102 | Temp 98.5°F | Resp 17 | Ht 74.0 in | Wt 209.0 lb

## 2021-05-01 DIAGNOSIS — N186 End stage renal disease: Secondary | ICD-10-CM

## 2021-05-01 DIAGNOSIS — E113411 Type 2 diabetes mellitus with severe nonproliferative diabetic retinopathy with macular edema, right eye: Secondary | ICD-10-CM

## 2021-05-01 DIAGNOSIS — I10 Essential (primary) hypertension: Secondary | ICD-10-CM

## 2021-05-01 DIAGNOSIS — I480 Paroxysmal atrial fibrillation: Secondary | ICD-10-CM

## 2021-05-01 MED ORDER — AFLIBERCEPT 2MG/0.05ML IZ SOLN FOR KALEIDOSCOPE
2.0000 mg | INTRAVITREAL | Status: AC | PRN
  Administered 2021-05-01: 2 mg via INTRAVITREAL

## 2021-05-01 NOTE — Progress Notes (Signed)
05/01/2021     CHIEF COMPLAINT Patient presents for  Chief Complaint  Patient presents with   Retina Follow Up    9 week fu OD and Eylea OD Pt states VA OU stable since last visit. Pt denies FOL, floaters, or ocular pain OU.  A1C: 5.7 LBS: 130      HISTORY OF PRESENT ILLNESS: [name] is a 65 y.o. male who presents to the clinic today for:   HPI     Retina Follow Up           Diagnosis: Diabetic Retinopathy   Laterality: right eye   Onset: 6 weeks ago   Severity: mild   Duration: 6 weeks   Course: stable   Comments: 9 week fu OD and Eylea OD Pt states VA OU stable since last visit. Pt denies FOL, floaters, or ocular pain OU.  A1C: 5.7 LBS: 130         Comments   6wk fu od oct eylea od Patient states vision is stable and unchanged since last visit. Denies any new floaters or FOL.  A1C: 5.9 ~ Last month BS: 137      Last edited by Laurin Coder, COA on 05/01/2021  2:55 PM.      Referring physician: [name], PA-C [place],  [place] [address]  HISTORICAL INFORMATION:   Selected notes from the MEDICAL RECORD NUMBER    Lab Results  Component Value Date   HGBA1C 5.7 (H) 09/16/2018     CURRENT MEDICATIONS: Current Outpatient Medications (Ophthalmic Drugs)  Medication Sig   Polyvinyl Alcohol-Povidone PF 1.4-0.6 % SOLN Place 1 drop into both eyes 2 (two) times daily as needed (dry eyes).    No current facility-administered medications for this visit. (Ophthalmic Drugs)   Current Outpatient Medications (Other)  Medication Sig   apixaban (ELIQUIS) 5 MG TABS tablet Take 5 mg by mouth 2 (two) times daily.   atorvastatin (LIPITOR) 40 MG tablet Take 40 mg by mouth at bedtime.    B Complex-C-Zn-Folic Acid (DIALYVITE/ZINC) TABS Take 1 tablet by mouth daily.   BIDIL 20-37.5 MG tablet Take 1 tablet by mouth 3 (three) times daily. Do not take on dialysis days (Tuesday, Thursday, Saturday)    carvedilol (COREG) 25 MG tablet Take 1.5  tablets (37.5 mg total) by mouth 2 (two) times daily. (Patient taking differently: Take 25 mg by mouth 2 (two) times daily. 1 AND A HALF TAB BID)   famotidine (PEPCID) 40 MG tablet Take 40 mg by mouth daily.   fentaNYL (DURAGESIC - DOSED MCG/HR) 50 MCG/HR Place 50 mcg onto the skin every 3 (three) days.   Glucosamine HCl (GLUCOSAMINE PO) Take 100 tablets by mouth 2 (two) times daily.   HUMALOG KWIKPEN 100 UNIT/ML KiwkPen Inject 15-16 Units into the skin See admin instructions. Inject 13 units before breakfast, lunch, & dinner--   insulin glargine (LANTUS) 100 UNIT/ML injection Inject 55 Units into the skin at bedtime.   ketoconazole (NIZORAL) 2 % cream Apply 1 application topically 2 (two) times daily as needed for irritation.    linaclotide (LINZESS) 145 MCG CAPS capsule Take 145 mcg by mouth daily as needed (constipation).    loratadine (CLARITIN) 10 MG tablet Take 10 mg by mouth daily.   NEEDLE, REUSABLE, 22 G 22G X 1-1/2" MISC 1 Units by Does not apply route as directed. For B12 IM inj   nitroGLYCERIN (NITROSTAT) 0.4 MG SL tablet Place 0.4 mg under the tongue  every 5 (five) minutes as needed for chest pain.   NOVOLOG FLEXPEN 100 UNIT/ML FlexPen Inject 13 Units into the skin daily.   omega-3 acid ethyl esters (LOVAZA) 1 g capsule Take 2 g by mouth 2 (two) times daily.   ondansetron (ZOFRAN) 4 MG tablet Take 4 mg by mouth every 6 (six) hours as needed for nausea or vomiting.   oxyCODONE-acetaminophen (PERCOCET) 7.5-325 MG tablet Take 1 tablet by mouth 3 (three) times daily as needed.   pantoprazole (PROTONIX) 40 MG tablet Take 40 mg by mouth daily.   pregabalin (LYRICA) 100 MG capsule Take 1 capsule (100 mg total) by mouth 3 (three) times daily.   rOPINIRole (REQUIP) 2 MG tablet Take 2 mg by mouth at bedtime.   Syringe/Needle, Disp, (SYRINGE 3CC/22GX1-1/2") 22G X 1-1/2" 3 ML MISC 1 Syringe by Does not apply route as directed. For b12 IM inj   tamsulosin (FLOMAX) 0.4 MG CAPS capsule Take 0.4 mg  by mouth daily.   torsemide (DEMADEX) 20 MG tablet Take 20 mg by mouth in the morning and at bedtime. Take on extra for swelling   No current facility-administered medications for this visit. (Other)      REVIEW OF SYSTEMS:    ALLERGIES Allergies  Allergen Reactions   Contrast Media [Iodinated Diagnostic Agents]     Difficulty breathing     Ivp Dye [Iodinated Diagnostic Agents] Anaphylaxis and Other (See Comments)    Breathing problems, altered mental state    Adhesive [Tape] Rash    Rash after 3-4 days use    PAST MEDICAL HISTORY Past Medical History:  Diagnosis Date   Carotid artery occlusion 11/10/10   LEFT CAROTID ENDARTERECTOMY   Chronic kidney disease    Complication of anesthesia    BP WENT UP AT [facility] "   COPD (chronic obstructive pulmonary disease) ([place])    pt denies this dx as of 06/01/20 - no inhaler    Diabetes mellitus without complication ([employer])    Diverticulitis    Diverticulosis of colon (without mention of hemorrhage)    DJD (degenerative joint disease)    knees/hands/feet/back/neck   Fatty liver    Full dentures    GERD (gastroesophageal reflux disease)    H/O hiatal hernia    History of blood transfusion    with a past surical procedure per patient 06/01/20   Hyperlipidemia    Hypertension    Neuromuscular disorder ([place])    peripheral neuropathy   Non-pressure chronic ulcer of other part of left foot limited to breakdown of skin ([place]) 11/12/2016   Osteomyelitis (HCC)    left 5th metatarsal   PAD (peripheral artery disease) ([place])    Distal aortogram June 2012. Atherectomy left popliteal artery July 2012.    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   Unstable angina ([place]) 09/16/2018   Wears glasses    Past Surgical History:  Procedure Laterality Date   AMPUTATION  11/05/2011   Procedure: AMPUTATION RAY;  Surgeon: Wylene Simmer,  MD;  Location: [place];  Service: Orthopedics;  Laterality: Right;  Amputation of Right 4&5th Toes   AMPUTATION Left 11/26/2012   Procedure: AMPUTATION RAY;  Surgeon: Wylene Simmer, MD;  Location: [place];  Service: Orthopedics;  Laterality: Left;  fourth ray amputation   AMPUTATION Right 08/27/2014   Procedure: Transmetatarsal Amputation;  Surgeon:  Newt Minion, MD;  Location: [place];  Service: Orthopedics;  Laterality: Right;   AMPUTATION Right 01/14/2015   Procedure: AMPUTATION BELOW KNEE;  Surgeon: Newt Minion, MD;  Location: [place];  Service: Orthopedics;  Laterality: Right;   AMPUTATION Left 10/21/2015   Procedure: Left Foot 5th Ray Amputation;  Surgeon: Newt Minion, MD;  Location: [place];  Service: Orthopedics;  Laterality: Left;   ANTERIOR FUSION CERVICAL SPINE  02/06/06   C4-5, C5-6, C6-7; SURGEON DR. MAX COHEN   AV FISTULA PLACEMENT Left 06/02/2020   Procedure: ARTERIOVENOUS (AV) FISTULA CREATION LEFT;  Surgeon: Waynetta Sandy, MD;  Location: [place];  Service: Vascular;  Laterality: Left;   BACK SURGERY     x 3   [place] Left 07/21/2020   Procedure: LEFT UPPER ARM ATERIOVENOUS SUPERFISTULALIZATION;  Surgeon: Waynetta Sandy, MD;  Location: [place];  Service: Vascular;  Laterality: Left;   BELOW KNEE LEG AMPUTATION Right    CARDIAC CATHETERIZATION  10/31/04   2009   CAROTID ENDARTERECTOMY  11/10/10   CAROTID ENDARTERECTOMY Left 11/10/2010   Subtotal occlusion of left internal carotid artery with left hemispheric transient ischemic attacks.   CAROTID STENT     CARPAL TUNNEL RELEASE Right 10/21/2013   Procedure: RIGHT CARPAL TUNNEL RELEASE;  Surgeon: Wynonia Sours, MD;  Location: [place];  Service: Orthopedics;  Laterality: Right;   CHOLECYSTECTOMY     COLON SURGERY     COLONOSCOPY     COLOSTOMY REVERSAL  05/21/2018   ileostomy reversal   CYSTOSCOPY WITH STENT PLACEMENT Bilateral 01/13/2018   Procedure: CYSTOSCOPY WITH BILATERAL URETERAL  CATHETER PLACEMENT;  Surgeon: Ardis Hughs, MD;  Location: [facility];  Service: Urology;  Laterality: Bilateral;   ESOPHAGEAL MANOMETRY Bilateral 07/19/2014   Procedure: ESOPHAGEAL MANOMETRY (EM);  Surgeon: Jerene Bears, MD;  [facility];  Service: Gastroenterology;  Laterality: Bilateral;   EYE SURGERY Bilateral 2020   cataract   FEMORAL ARTERY STENT     x6   FINGER SURGERY     FOOT SURGERY  04/25/2016    EXCISION BASE 5TH METATARSAL AND PARTIAL CUBOID LEFT FOOT   HERNIA REPAIR     LEFT INGUINAL AND UMBILICAL REPAIRS   HERNIA REPAIR     I & D EXTREMITY Left 04/25/2016   Procedure: EXCISION BASE 5TH METATARSAL AND PARTIAL CUBOID LEFT FOOT;  Surgeon: Newt Minion, MD;  Location: [place];  Service: Orthopedics;  Laterality: Left;   ILEOSTOMY  01/13/2018   Procedure: ILEOSTOMY;  Surgeon: Clovis Riley, MD;  Location: [facility];  Service: General;;   ILEOSTOMY CLOSURE N/A 05/21/2018   Procedure: ILEOSTOMY REVERSAL ERAS PATHWAY;  Surgeon: Clovis Riley, MD;  Location: [place];  Service: General;  Laterality: N/A;   IR RADIOLOGIST EVAL & MGMT  11/19/2017   IR RADIOLOGIST EVAL & MGMT  12/03/2017   IR RADIOLOGIST EVAL & MGMT  12/18/2017   JOINT REPLACEMENT Right 2001   Total knee   LAMINECTOMY     X 3 LUMBAR AND X 2 CERVICAL SPINE OPERATIONS   LAPAROSCOPIC CHOLECYSTECTOMY W/ CHOLANGIOGRAPHY  11/09/04   SURGEON DR. Leonidas CATH AND CORONARY ANGIOGRAPHY N/A 09/16/2018   Procedure: LEFT HEART CATH AND CORONARY ANGIOGRAPHY;  Surgeon: Nigel Mormon, MD;  Location: [place] [facility];  Service: Cardiovascular;  Laterality: N/A;   LEFT HEART CATHETERIZATION WITH CORONARY ANGIOGRAM N/A 10/29/2014   Procedure: LEFT HEART CATHETERIZATION WITH  CORONARY ANGIOGRAM;  Surgeon: Laverda Page, MD;  Location: [place] [facility];  Service: Cardiovascular;  Laterality: N/A;   LIGATION OF COMPETING BRANCHES OF ARTERIOVENOUS FISTULA Left 07/21/2020   Procedure: LIGATION OF COMPETING  BRANCHES OF LEFT UPPER ARM ARTERIOVENOUS FISTULA;  Surgeon: Waynetta Sandy, MD;  Location: [place];  Service: Vascular;  Laterality: Left;   LOWER EXTREMITY ANGIOGRAM N/A 03/19/2012   Procedure: LOWER EXTREMITY ANGIOGRAM;  Surgeon: Burnell Blanks, MD;  Location[facility];  Service: Cardiovascular;  Laterality: N/A;   NECK SURGERY     PARTIAL COLECTOMY N/A 01/13/2018   Procedure: LAPAROSCOPIC ASSISTED   SIGMOID COLECTOMY ILEOSTOMY;  Surgeon: Clovis Riley, MD;  Location[facility];  Service: General;  Laterality: N/A;   PENILE PROSTHESIS IMPLANT  08/14/05   INFRAPUBIC INSERTION OF INFLATABLE PENILE PROSTHESIS; SURGEON DR. Amalia Hailey   PENILE PROSTHESIS IMPLANT     PERCUTANEOUS CORONARY STENT INTERVENTION (PCI-S) Right 10/29/2014   Procedure: PERCUTANEOUS CORONARY STENT INTERVENTION (PCI-S);  Surgeon: Laverda Page, MD;  Location: [facility] CATH LAB;  Service: Cardiovascular;  Laterality: Right;   SHOULDER ARTHROSCOPY     SPINE SURGERY     TOE AMPUTATION Left    TONSILLECTOMY     TOTAL KNEE ARTHROPLASTY  07/2002   RIGHT KNEE ; SURGEON  DR. [name] ALSO HAD ARTHROSCOPIC RIGHT KNEE IN  10/2001   TOTAL KNEE ARTHROPLASTY     ULNAR NERVE TRANSPOSITION Right 10/21/2013   Procedure: RIGHT ELBOW  ULNAR NERVE DECOMPRESSION;  Surgeon: Wynonia Sours, MD;  Location: [place];  Service: Orthopedics;  Laterality: Right;    FAMILY HISTORY Family History  Problem Relation Age of Onset   Heart disease Father        Before age 68-  CAD, BPG   Diabetes Father        Amputation   Cancer Father        PROSTATE   Hyperlipidemia Father    Hypertension Father    Heart attack Father        Triple BPG   Varicose Veins Father    Colon cancer Brother    Diabetes Brother    Heart disease Brother 59       A-Fib. Before age 25   Hyperlipidemia Brother    Hypertension Brother    Cancer Sister        Breast   Hyperlipidemia Sister    Hypertension Sister    Hypertension Son     Arthritis Other        GRANDMOTHER   Hypertension Other        OTHER FAMILY MEMBERS    SOCIAL HISTORY Social History   Tobacco Use   Smoking status: Former    Packs/day: 2.00    Years: 35.00    Pack years: 70.00    Types: Cigarettes    Quit date: 10/28/2011    Years since quitting: 9.5   Smokeless tobacco: Never  Vaping Use   Vaping Use: Some days   Substances: Nicotine  Substance Use Topics   Alcohol use: Not Currently    Comment: "not in a long time"   Drug use: Never         OPHTHALMIC EXAM:  Base Eye Exam     Visual Acuity (ETDRS)       Right Left   Dist [place] 20/70 -2 20/20 -1   Dist ph [place] 20/60          Tonometry (Tonopen, 2:45 PM)  Right Left   Pressure 10 10         Pupils       Pupils Dark Light Shape React APD   Right PERRL 3 2 Round Brisk None   Left PERRL 2 2 Round Minimal None         Visual Fields (Counting fingers)       Left Right    Full Full         Extraocular Movement       Right Left    Full Full         Neuro/Psych     Oriented x3: Yes   Mood/Affect: Normal         Dilation     Right eye: 1.0% Mydriacyl, 2.5% Phenylephrine @ 2:53 PM           Slit Lamp and Fundus Exam     External Exam       Right Left   External Normal Normal         Slit Lamp Exam       Right Left   Lids/Lashes Normal Normal   Conjunctiva/Sclera White and quiet White and quiet   Cornea Clear Clear   Anterior Chamber Deep and quiet Deep and quiet   Iris Round and reactive Round and reactive   Lens Posterior chamber intraocular lens Posterior chamber intraocular lens   Anterior Vitreous Normal Normal         Fundus Exam       Right Left   Posterior Vitreous Normal    Disc Normal    C/D Ratio 0.4    Macula Exudates, Microaneurysms, no apparent CSME    Vessels NPDR severe, with numerous dot blot hemorrhages, VCABS, [place].  Some evidence of incomplete central retinal vein occlusion    Periphery Normal, good  PRP 360 yet large dot blot hemorrhages are noted temporally             IMAGING AND PROCEDURES  Imaging and Procedures for 05/01/21  Intravitreal Injection, Pharmacologic Agent - OD - Right Eye       Time Out 05/01/2021. 3:02 PM. Confirmed correct patient, procedure, site, and patient consented.   Anesthesia Topical anesthesia was used. Anesthetic medications included [place] 3.5%.   Procedure Preparation included 5% betadine to ocular surface, 10% betadine to eyelids, Ofloxacin . A 30 gauge needle was used.   Injection: 2 mg aflibercept 2 MG/0.05ML   Route: Intravitreal, Site: Right Eye   NDC: [id number], Lot: [id number], Waste: 0 mL   Post-op Post injection exam found visual acuity of at least counting fingers. The patient tolerated the procedure well. There were no complications. The patient received written and verbal post procedure care education. Post injection medications were not given.      OCT, Retina - OU - Both Eyes       Right Eye Quality was good. Scan locations included subfoveal. Central Foveal Thickness: 241. Progression has improved. Findings include abnormal foveal contour, cystoid macular edema.   Left Eye Quality was good. Scan locations included subfoveal. Central Foveal Thickness: 280. Progression has been stable. Findings include normal foveal contour.   Notes Vastly improved center involved CSME post last injection Eylea at 6 weeks concomitant with the institution of renal dialysis 5 weeks previous.  And now much improved As compared to last visit 03/20/2021             ASSESSMENT/PLAN:  Severe nonproliferative diabetic retinopathy of right eye, with macular edema,  associated with type 2 diabetes mellitus ([place]) OD, vastly improved CME CSME post Avastin some 6 weeks previous.     ICD-10-CM   1. Severe nonproliferative diabetic retinopathy of right eye, with macular edema, associated with type 2 diabetes mellitus (HCC)  E11.3411  Intravitreal Injection, Pharmacologic Agent - OD - Right Eye    OCT, Retina - OU - Both Eyes    aflibercept (EYLEA) SOLN 2 mg      1.  Improved acuity OD 6 weeks post intravitreal Eylea for center involved CSME.  We will repeat injection again today and examination next in 7 weeks  2.  OS we will continue to monitor  3.  Ophthalmic Meds Ordered this visit:  Meds ordered this encounter  Medications   aflibercept (EYLEA) SOLN 2 mg       Return in about 7 weeks (around 06/19/2021) for dilate, OD, EYLEA OCT.  There are no Patient Instructions on file for this visit.   Explained the diagnoses, plan, and follow up with the patient and they expressed understanding.  Patient expressed understanding of the importance of proper follow up care.   Clent Demark Marten Iles M.D. Diseases & Surgery of the Retina and Vitreous Retina & Diabetic [place] 05/01/21     Abbreviations: M myopia (nearsighted); A astigmatism; H hyperopia (farsighted); P presbyopia; Mrx spectacle prescription;  CTL contact lenses; OD right eye; OS left eye; OU both eyes  XT exotropia; ET esotropia; PEK punctate epithelial keratitis; PEE punctate epithelial erosions; DES dry eye syndrome; MGD meibomian gland dysfunction; ATs artificial tears; PFAT's preservative free artificial tears; [place] nuclear sclerotic cataract; PSC posterior subcapsular cataract; ERM epi-retinal membrane; PVD posterior vitreous detachment; RD retinal detachment; DM diabetes mellitus; DR diabetic retinopathy; NPDR non-proliferative diabetic retinopathy; PDR proliferative diabetic retinopathy; CSME clinically significant macular edema; DME diabetic macular edema; dbh dot blot hemorrhages; CWS cotton wool spot; POAG primary open angle glaucoma; C/D cup-to-disc ratio; HVF humphrey visual field; GVF goldmann visual field; OCT optical coherence tomography; IOP intraocular pressure; BRVO Branch retinal vein occlusion; CRVO central retinal vein occlusion; CRAO  central retinal artery occlusion; BRAO branch retinal artery occlusion; RT retinal tear; SB scleral buckle; PPV pars plana vitrectomy; VH Vitreous hemorrhage; PRP panretinal laser photocoagulation; IVK intravitreal kenalog; VMT vitreomacular traction; MH Macular hole;  NVD neovascularization of the disc; NVE neovascularization elsewhere; AREDS age related eye disease study; ARMD age related macular degeneration; POAG primary open angle glaucoma; EBMD epithelial/anterior basement membrane dystrophy; ACIOL anterior chamber intraocular lens; IOL intraocular lens; PCIOL posterior chamber intraocular lens; Phaco/IOL phacoemulsification with intraocular lens placement; [place] photorefractive keratectomy; LASIK laser assisted in situ keratomileusis; HTN hypertension; DM diabetes mellitus; COPD chronic obstructive pulmonary disease

## 2021-05-01 NOTE — Assessment & Plan Note (Signed)
OD, vastly improved CME CSME post Avastin some 6 weeks previous.

## 2021-05-01 NOTE — Progress Notes (Signed)
Primary Physician/Referring:  [name], PA-C  Patient ID: [name], male    DOB: [birth year], 65 y.o.   MRN: [id number]  Chief Complaint  Patient presents with   Follow-up   Hypertension   Coronary Artery Disease   HPI:    [name]  is a 65 y.o. male  with severe peripheral arterial disease and has history of right below-knee amputation and toe amputations on the left, multiple peripheral interventions in the past, uncontrolled diabetes mellitus, ESRD, severe diabetic retinopathy, coronary artery disease angioplasty to the right coronary artery on 10/29/2014, right carotid endarterectomy in 2012 , OSA Unable to tolerate CPAP, chronic back pain and neck and back surgery in past and on chronic pain medications. Coronary Angiography 09/17/2018 revealed widely patent stent and no significant new disease.  Diagnosed with paroxysmal atrial fibrillation 04/2021, on anticoagulation.  Patient presents for urgent visit as he called the on-call service from dialysis on Saturday with concerns of continued hypotension.  During patient's last visit on 04/10/2021 this was a primary concern for him.  Patient states he has been holding all antihypertensive medications on dialysis days, but continues to have episodes of hypotension during dialysis.  He also states he is only been taking a fourth of Coreg 25 mg tablet on nondialysis days.   Unfortunately patient continues to use e-cigarettes regularly.  Past Medical History:  Diagnosis Date   Carotid artery occlusion 11/10/10   LEFT CAROTID ENDARTERECTOMY   Chronic kidney disease    Complication of anesthesia    BP WENT UP AT [facility] "   COPD (chronic obstructive pulmonary disease) ([place])    pt denies this dx as of 06/01/20 - no inhaler    Diabetes mellitus without complication ([place])    Diverticulitis    Diverticulosis of colon (without mention of hemorrhage)    DJD (degenerative joint disease)    knees/hands/feet/back/neck   Fatty liver    Full  dentures    GERD (gastroesophageal reflux disease)    H/O hiatal hernia    History of blood transfusion    with a past surical procedure per patient 06/01/20   Hyperlipidemia    Hypertension    Neuromuscular disorder ([place])    peripheral neuropathy   Non-pressure chronic ulcer of other part of left foot limited to breakdown of skin ([place]) 11/12/2016   Osteomyelitis (HCC)    left 5th metatarsal   PAD (peripheral artery disease) ([place])    Distal aortogram June 2012. Atherectomy left popliteal artery July 2012.    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   Unstable angina ([place]) 09/16/2018   Wears glasses    Past Surgical History:  Procedure Laterality Date   AMPUTATION  11/05/2011   Procedure: AMPUTATION RAY;  Surgeon: Wylene Simmer, MD;  Location: [place];  Service: Orthopedics;  Laterality: Right;  Amputation of Right 4&5th Toes   AMPUTATION Left 11/26/2012   Procedure: AMPUTATION RAY;  Surgeon: Wylene Simmer, MD;  Location: [place];  Service: Orthopedics;  Laterality: Left;  fourth ray amputation   AMPUTATION Right 08/27/2014   Procedure: Transmetatarsal Amputation;  Surgeon: Newt Minion, MD;  Location: [place];  Service: Orthopedics;  Laterality: Right;   AMPUTATION Right 01/14/2015   Procedure: AMPUTATION BELOW KNEE;  Surgeon: Newt Minion, MD;  Location: [place];  Service: Orthopedics;  Laterality: Right;   AMPUTATION Left 10/21/2015   Procedure: Left Foot 5th Ray Amputation;  Surgeon: Newt Minion, MD;  Location: [place];  Service: Orthopedics;  Laterality: Left;   ANTERIOR FUSION CERVICAL SPINE  02/06/06   C4-5, C5-6, C6-7; SURGEON DR. MAX COHEN   AV FISTULA PLACEMENT Left 06/02/2020   Procedure: ARTERIOVENOUS (AV) FISTULA CREATION LEFT;  Surgeon: Waynetta Sandy, MD;  Location: [place];  Service: Vascular;  Laterality: Left;   BACK SURGERY     x 3   [place] Left 07/21/2020   Procedure: LEFT UPPER ARM ATERIOVENOUS SUPERFISTULALIZATION;  Surgeon: Waynetta Sandy, MD;  Location: [place];  Service: Vascular;  Laterality: Left;   BELOW KNEE LEG AMPUTATION Right    CARDIAC CATHETERIZATION  10/31/04   2009   CAROTID ENDARTERECTOMY  11/10/10   CAROTID ENDARTERECTOMY Left 11/10/2010   Subtotal occlusion of left internal carotid artery with left hemispheric transient ischemic attacks.   CAROTID STENT     CARPAL TUNNEL RELEASE Right 10/21/2013   Procedure: RIGHT CARPAL TUNNEL RELEASE;  Surgeon: Wynonia Sours, MD;  Location: [place];  Service: Orthopedics;  Laterality: Right;   CHOLECYSTECTOMY     COLON SURGERY     COLONOSCOPY     COLOSTOMY REVERSAL  05/21/2018   ileostomy reversal   CYSTOSCOPY WITH STENT PLACEMENT Bilateral 01/13/2018   Procedure: CYSTOSCOPY WITH BILATERAL URETERAL CATHETER PLACEMENT;  Surgeon: Ardis Hughs, MD;  Location: [facility];  Service: Urology;  Laterality: Bilateral;   ESOPHAGEAL MANOMETRY Bilateral 07/19/2014   Procedure: ESOPHAGEAL MANOMETRY (EM);  Surgeon: Jerene Bears, MD;  Location[facility];  Service: Gastroenterology;  Laterality: Bilateral;   EYE SURGERY Bilateral 2020   cataract   FEMORAL ARTERY STENT     x6   FINGER SURGERY     FOOT SURGERY  04/25/2016    EXCISION BASE 5TH METATARSAL AND PARTIAL CUBOID LEFT FOOT   HERNIA REPAIR     LEFT INGUINAL AND UMBILICAL REPAIRS   HERNIA REPAIR     I & D EXTREMITY Left 04/25/2016   Procedure: EXCISION BASE 5TH METATARSAL AND PARTIAL CUBOID LEFT FOOT;  Surgeon: Newt Minion, MD;  Location: [place];  Service: Orthopedics;  Laterality: Left;   ILEOSTOMY  01/13/2018   Procedure: ILEOSTOMY;  Surgeon: Clovis Riley, MD;  Location: [facility];  Service: General;;   ILEOSTOMY CLOSURE N/A 05/21/2018   Procedure: ILEOSTOMY REVERSAL ERAS PATHWAY;  Surgeon: Clovis Riley, MD;  Location: [place];  Service: General;  Laterality: N/A;   IR RADIOLOGIST  EVAL & MGMT  11/19/2017   IR RADIOLOGIST EVAL & MGMT  12/03/2017   IR RADIOLOGIST EVAL & MGMT  12/18/2017   JOINT REPLACEMENT Right 2001   Total knee   LAMINECTOMY     X 3 LUMBAR AND X 2 CERVICAL SPINE OPERATIONS   LAPAROSCOPIC CHOLECYSTECTOMY W/ CHOLANGIOGRAPHY  11/09/04   SURGEON DR. Rio Lajas CATH AND CORONARY ANGIOGRAPHY N/A 09/16/2018   Procedure: LEFT HEART CATH AND CORONARY ANGIOGRAPHY;  Surgeon: Nigel Mormon, MD;  Location: [place] [facility];  Service: Cardiovascular;  Laterality: N/A;   LEFT HEART CATHETERIZATION WITH CORONARY ANGIOGRAM N/A 10/29/2014   Procedure: LEFT HEART CATHETERIZATION WITH CORONARY ANGIOGRAM;  Surgeon: Laverda Page, MD;  Location[facility] C[facility];  Service: Cardiovascular;  Laterality: N/A;   LIGATION OF COMPETING BRANCHES OF ARTERIOVENOUS FISTULA Left 07/21/2020   Procedure: LIGATION OF COMPETING BRANCHES OF  LEFT UPPER ARM ARTERIOVENOUS FISTULA;  Surgeon: Waynetta Sandy, MD;  Location: [place];  Service: Vascular;  Laterality: Left;   LOWER EXTREMITY ANGIOGRAM N/A 03/19/2012   Procedure: LOWER EXTREMITY ANGIOGRAM;  Surgeon: Burnell Blanks, MD;  Location: [facility] CATH LAB;  Service: Cardiovascular;  Laterality: N/A;   NECK SURGERY     PARTIAL COLECTOMY N/A 01/13/2018   Procedure: LAPAROSCOPIC ASSISTED   SIGMOID COLECTOMY ILEOSTOMY;  Surgeon: Clovis Riley, MD;  Location: [facility];  Service: General;  Laterality: N/A;   PENILE PROSTHESIS IMPLANT  08/14/05   INFRAPUBIC INSERTION OF INFLATABLE PENILE PROSTHESIS; SURGEON DR. Amalia Hailey   PENILE PROSTHESIS IMPLANT     PERCUTANEOUS CORONARY STENT INTERVENTION (PCI-S) Right 10/29/2014   Procedure: PERCUTANEOUS CORONARY STENT INTERVENTION (PCI-S);  Surgeon: Laverda Page, MD;  Location: [facility] CATH LAB;  Service: Cardiovascular;  Laterality: Right;   SHOULDER ARTHROSCOPY     SPINE SURGERY     TOE AMPUTATION Left    TONSILLECTOMY     TOTAL KNEE ARTHROPLASTY  07/2002   RIGHT KNEE ; SURGEON   DR. [name] ALSO HAD ARTHROSCOPIC RIGHT KNEE IN  10/2001   TOTAL KNEE ARTHROPLASTY     ULNAR NERVE TRANSPOSITION Right 10/21/2013   Procedure: RIGHT ELBOW  ULNAR NERVE DECOMPRESSION;  Surgeon: Wynonia Sours, MD;  Location: [place];  Service: Orthopedics;  Laterality: Right;   Family History  Problem Relation Age of Onset   Heart disease Father        Before age 64-  CAD, BPG   Diabetes Father        Amputation   Cancer Father        PROSTATE   Hyperlipidemia Father    Hypertension Father    Heart attack Father        Triple BPG   Varicose Veins Father    Colon cancer Brother    Diabetes Brother    Heart disease Brother 70       A-Fib. Before age 39   Hyperlipidemia Brother    Hypertension Brother    Cancer Sister        Breast   Hyperlipidemia Sister    Hypertension Sister    Hypertension Son    Arthritis Other        GRANDMOTHER   Hypertension Other        OTHER FAMILY MEMBERS   Social History   Tobacco Use   Smoking status: Former    Packs/day: 2.00    Years: 35.00    Pack years: 70.00    Types: Cigarettes    Quit date: 10/28/2011    Years since quitting: 9.5   Smokeless tobacco: Never  Substance Use Topics   Alcohol use: Not Currently    Comment: "not in a long time"   Marital Status: Married   ROS  Review of Systems  Constitutional: Negative for malaise/fatigue and weight gain.  Cardiovascular:  Positive for leg swelling (improved). Negative for chest pain, claudication, near-syncope, orthopnea, palpitations, paroxysmal nocturnal dyspnea and syncope.  Respiratory:  Negative for shortness of breath.   Hematologic/Lymphatic: Does not bruise/bleed easily.  Musculoskeletal:  Positive for arthritis and back pain.  Gastrointestinal:  Positive for nausea (after dialysis). Negative for melena.  Neurological:  Positive for dizziness (after dialysis).  Objective   Vitals with BMI 05/01/2021 05/01/2021 04/10/2021  Height - 6\' 2"  6\' 2"   Weight - 209  lbs 253 lbs  BMI - 51.70 01.74  Systolic 944 967 591  Diastolic 72 66 52  Pulse 462 93 80    Blood pressure (!) 151/72, pulse (!) 102, temperature 98.5 F (36.9 C), temperature source Temporal, resp. rate 17, height 6\' 2"  (1.88 m), weight 209 lb (94.8 kg), SpO2 99 %. Body mass index is 26.83 kg/m.   Physical Exam Vitals reviewed.  Constitutional:      General: He is not in acute distress.    Comments: Well built and moderately obese in no acute distress  Cardiovascular:     Rate and Rhythm: Normal rate and regular rhythm.     Pulses: Intact distal pulses.          Carotid pulses are  on the right side with bruit and  on the left side with bruit.      Femoral pulses are 1+ on the right side with bruit and 1+ on the left side with bruit.      Popliteal pulses are 0 on the right side and 0 on the left side.        Right dorsalis pedis pulse not accessible and left dorsalis pedis pulse not accessible.        Right posterior tibial pulse not accessible and left posterior tibial pulse not accessible.     Heart sounds: Normal heart sounds. No murmur heard.   No gallop.     Comments: Right BKA. No JVD Left brachial artery AV Shunt noted Pulmonary:     Effort: Pulmonary effort is normal.     Breath sounds: Normal breath sounds.  Musculoskeletal:     Cervical back: Neck supple.     Comments: Trace edema  Neurological:     Mental Status: He is alert.   Laboratory examination:   Recent Labs    05/30/20 1646 06/02/20 0627 07/21/20 0721 11/18/20 1831  NA 139 139 140 138  K 4.0 3.5 4.0 4.2  CL 106 106 107 108  CO2 19*  --   --  23  GLUCOSE 226* 130* 163* 80  BUN 27 32* 38* 45*  CREATININE 4.28* 5.10* 4.90* 4.71*  CALCIUM 7.9*  --   --  8.2*  GFRNONAA 14*  --   --  13*  GFRAA 16*  --   --   --    CMP Latest Ref Rng & Units 11/18/2020 07/21/2020 06/02/2020  Glucose 70 - 99 mg/dL 80 163(H) 130(H)  BUN 8 - 23 mg/dL 45(H) 38(H) 32(H)  Creatinine 0.61 - 1.24 mg/dL 4.71(H) 4.90(H)  5.10(H)  Sodium 135 - 145 mmol/L 138 140 139  Potassium 3.5 - 5.1 mmol/L 4.2 4.0 3.5  Chloride 98 - 111 mmol/L 108 107 106  CO2 22 - 32 mmol/L 23 - -  Calcium 8.9 - 10.3 mg/dL 8.2(L) - -  Total Protein 6.0 - 8.5 g/dL - - -  Total Bilirubin 0.0 - 1.2 mg/dL - - -  Alkaline Phos 44 - 121 IU/L - - -  AST 0 - 40 IU/L - - -  ALT 0 - 44 IU/L - - -   CBC Latest Ref Rng & Units 11/18/2020 10/17/2020 10/03/2020  WBC 4.0 - 10.5 K/uL 7.7 - -  Hemoglobin 13.0 - 17.0 g/dL 9.1(L) 9.8(L) 9.5(L)  Hematocrit 39.0 - 52.0 % 28.7(L) - -  Platelets 150 - 400 K/uL 149(L) - -   Lipid Panel     Component Value Date/Time   CHOL 117 05/30/2020 1646   TRIG 133 05/30/2020 1646   HDL 24 (L) 05/30/2020 1646   LDLCALC  69 05/30/2020 1646   HEMOGLOBIN A1C Lab Results  Component Value Date   HGBA1C 5.7 (H) 09/16/2018   MPG 116.89 09/16/2018   External labs:  A1C 6.100 % 03/30/2020   Hemoglobin 10.800 05/23/2020 Platelets 203.000 x1 05/02/2020  Creatinine, Serum 4.140 mg/ 05/02/2020 Potassium 4.300 mm 05/02/2020 ALT (SGPT) 16.000 IU/ 05/02/2020  Cholesterol, total 142.000 m 08/19/2018 HDL 32.000 mg 08/19/2018 LDL 79.000 mg 08/19/2018 Triglycerides 156.000 m 08/19/2018  A1C 6.900 % 09/02/2019  Creatinine, Serum 3.590 mg/ 10/05/2019 Potassium 4.300 mm 10/05/2019 ALT (SGPT) 12.000 IU/ 10/05/2019  Hemoglobin 9.700 11/09/2019; INR 1.000 01/07/2018 Platelets 232.000 x1 10/05/2019   Allergies   Allergies  Allergen Reactions   Contrast Media [Iodinated Diagnostic Agents]     Difficulty breathing     Ivp Dye [Iodinated Diagnostic Agents] Anaphylaxis and Other (See Comments)    Breathing problems, altered mental state    Adhesive [Tape] Rash    Rash after 3-4 days use    Medications Prior to Visit:   Outpatient Medications Prior to Visit  Medication Sig Dispense Refill   apixaban (ELIQUIS) 5 MG TABS tablet Take 5 mg by mouth 2 (two) times daily.     atorvastatin (LIPITOR) 40 MG tablet Take 40 mg by  mouth at bedtime.      B Complex-C-Zn-Folic Acid (DIALYVITE/ZINC) TABS Take 1 tablet by mouth daily.     BIDIL 20-37.5 MG tablet Take 1 tablet by mouth 3 (three) times daily. Do not take on dialysis days (Tuesday, Thursday, Saturday)      carvedilol (COREG) 25 MG tablet Take 1.5 tablets (37.5 mg total) by mouth 2 (two) times daily. (Patient taking differently: Take 25 mg by mouth 2 (two) times daily. 1 AND A HALF TAB BID) 270 tablet 2   famotidine (PEPCID) 40 MG tablet Take 40 mg by mouth daily.     fentaNYL (DURAGESIC - DOSED MCG/HR) 50 MCG/HR Place 50 mcg onto the skin every 3 (three) days.     Glucosamine HCl (GLUCOSAMINE PO) Take 100 tablets by mouth 2 (two) times daily.     HUMALOG KWIKPEN 100 UNIT/ML KiwkPen Inject 15-16 Units into the skin See admin instructions. Inject 13 units before breakfast, lunch, & dinner--  0   insulin glargine (LANTUS) 100 UNIT/ML injection Inject 55 Units into the skin at bedtime.     ketoconazole (NIZORAL) 2 % cream Apply 1 application topically 2 (two) times daily as needed for irritation.   1   linaclotide (LINZESS) 145 MCG CAPS capsule Take 145 mcg by mouth daily as needed (constipation).      loratadine (CLARITIN) 10 MG tablet Take 10 mg by mouth daily.     NEEDLE, REUSABLE, 22 G 22G X 1-1/2" MISC 1 Units by Does not apply route as directed. For B12 IM inj 10 each 0   nitroGLYCERIN (NITROSTAT) 0.4 MG SL tablet Place 0.4 mg under the tongue every 5 (five) minutes as needed for chest pain.     NOVOLOG FLEXPEN 100 UNIT/ML FlexPen Inject 13 Units into the skin daily.     omega-3 acid ethyl esters (LOVAZA) 1 g capsule Take 2 g by mouth 2 (two) times daily.     ondansetron (ZOFRAN) 4 MG tablet Take 4 mg by mouth every 6 (six) hours as needed for nausea or vomiting.     oxyCODONE-acetaminophen (PERCOCET) 7.5-325 MG tablet Take 1 tablet by mouth 3 (three) times daily as needed.     pantoprazole (PROTONIX) 40 MG tablet Take 40 mg by mouth  daily.     Polyvinyl  Alcohol-Povidone PF 1.4-0.6 % SOLN Place 1 drop into both eyes 2 (two) times daily as needed (dry eyes).      pregabalin (LYRICA) 100 MG capsule Take 1 capsule (100 mg total) by mouth 3 (three) times daily. 30 capsule 0   rOPINIRole (REQUIP) 2 MG tablet Take 2 mg by mouth at bedtime.     Syringe/Needle, Disp, (SYRINGE 3CC/22GX1-1/2") 22G X 1-1/2" 3 ML MISC 1 Syringe by Does not apply route as directed. For b12 IM inj 10 each 0   tamsulosin (FLOMAX) 0.4 MG CAPS capsule Take 0.4 mg by mouth daily.     torsemide (DEMADEX) 20 MG tablet Take 20 mg by mouth in the morning and at bedtime. Take on extra for swelling     No facility-administered medications prior to visit.   Final Medications at End of Visit    Current Meds  Medication Sig   apixaban (ELIQUIS) 5 MG TABS tablet Take 5 mg by mouth 2 (two) times daily.   atorvastatin (LIPITOR) 40 MG tablet Take 40 mg by mouth at bedtime.    B Complex-C-Zn-Folic Acid (DIALYVITE/ZINC) TABS Take 1 tablet by mouth daily.   BIDIL 20-37.5 MG tablet Take 1 tablet by mouth 3 (three) times daily. Do not take on dialysis days (Tuesday, Thursday, Saturday)    carvedilol (COREG) 25 MG tablet Take 1.5 tablets (37.5 mg total) by mouth 2 (two) times daily. (Patient taking differently: Take 25 mg by mouth 2 (two) times daily. 1 AND A HALF TAB BID)   famotidine (PEPCID) 40 MG tablet Take 40 mg by mouth daily.   fentaNYL (DURAGESIC - DOSED MCG/HR) 50 MCG/HR Place 50 mcg onto the skin every 3 (three) days.   Glucosamine HCl (GLUCOSAMINE PO) Take 100 tablets by mouth 2 (two) times daily.   HUMALOG KWIKPEN 100 UNIT/ML KiwkPen Inject 15-16 Units into the skin See admin instructions. Inject 13 units before breakfast, lunch, & dinner--   insulin glargine (LANTUS) 100 UNIT/ML injection Inject 55 Units into the skin at bedtime.   ketoconazole (NIZORAL) 2 % cream Apply 1 application topically 2 (two) times daily as needed for irritation.    linaclotide (LINZESS) 145 MCG CAPS  capsule Take 145 mcg by mouth daily as needed (constipation).    loratadine (CLARITIN) 10 MG tablet Take 10 mg by mouth daily.   NEEDLE, REUSABLE, 22 G 22G X 1-1/2" MISC 1 Units by Does not apply route as directed. For B12 IM inj   nitroGLYCERIN (NITROSTAT) 0.4 MG SL tablet Place 0.4 mg under the tongue every 5 (five) minutes as needed for chest pain.   NOVOLOG FLEXPEN 100 UNIT/ML FlexPen Inject 13 Units into the skin daily.   omega-3 acid ethyl esters (LOVAZA) 1 g capsule Take 2 g by mouth 2 (two) times daily.   ondansetron (ZOFRAN) 4 MG tablet Take 4 mg by mouth every 6 (six) hours as needed for nausea or vomiting.   oxyCODONE-acetaminophen (PERCOCET) 7.5-325 MG tablet Take 1 tablet by mouth 3 (three) times daily as needed.   pantoprazole (PROTONIX) 40 MG tablet Take 40 mg by mouth daily.   Polyvinyl Alcohol-Povidone PF 1.4-0.6 % SOLN Place 1 drop into both eyes 2 (two) times daily as needed (dry eyes).    pregabalin (LYRICA) 100 MG capsule Take 1 capsule (100 mg total) by mouth 3 (three) times daily.   rOPINIRole (REQUIP) 2 MG tablet Take 2 mg by mouth at bedtime.   Syringe/Needle, Disp, (SYRINGE 3CC/22GX1-1/2")  22G X 1-1/2" 3 ML MISC 1 Syringe by Does not apply route as directed. For b12 IM inj   tamsulosin (FLOMAX) 0.4 MG CAPS capsule Take 0.4 mg by mouth daily.   torsemide (DEMADEX) 20 MG tablet Take 20 mg by mouth in the morning and at bedtime. Take on extra for swelling   Cardiac Studies:   Left popliteal artery atherectomy in June 2012 and left below knee PTA 2014 by Dr. Brunetta Jeans at [place].   Lexiscan sestamibi stress test 03/18/2017: 1. The resting electrocardiogram demonstrated normal sinus rhythm, RBBB and no resting arrhythmias. Stress EKG is non-diagnostic for ischemia as it a pharmacologic stress using Lexiscan. Occasional PVC noted. Stress symptoms included dyspnea, dizziness and chest pain. 2. The LV is dilated both at rest and stress images. The LV end diastolic volume was  093AT. Perfusion images reveal inferior soft tissue attenuation artifact. There is no ischemia or scar. Overall left ventricular systolic function was normal without regional wall motion abnormalities. The left ventricular ejection fraction was calculated to be 51%. This is a low risk study. Compared to the study done on 10/15/2014, inferior wall ischemia and associated inferior hypokinesis is no longer present.  Coronary angiogram 09/17/18: Widely patent stent stents. Normal LVEF. Coronary Angiogram 10/29/2014: CAD s/p stenting of the distal, mid and proximal RCA with implantation of 3 overlapping drug-eluting stent, from distal to proximal 2.5 x 38 mm, 2.75 x 38 mm and a 2.75 x 20 mm Promus premier DES.   Echocardiogram 09/16/2018:   Left ventricle: The cavity size was normal. There was moderate concentric hypertrophy. Systolic function was normal. The estimated ejection fraction was in the range of 55% to 60%. Features are consistent with a pseudonormal left ventricular filling pattern, with concomitant abnormal relaxation and increased filling pressure (grade 2 diastolic dysfunction). Doppler parameters are consistent with both elevated ventricular end-diastolic filling pressure and elevated left atrial filling pressure. Mitral valve: Calcified annulus.  Left atrium: The atrium was mildly dilated. Inferior vena cava: The vessel was dilated. The respirophasic diameter changes was normal, may suggest elevated central venous pressure.  Carotid artery duplex  06/09/2019:  Minimal stenosis in bilateral ICA of 1-15%. Heterogeneous plaque. Stenosis  in the right external carotid artery (<50%).  Left carotid endarterectomy site is patent.  Right vertebral artery flow is not well visualized. Antegrade left  vertebral artery flow.  No significant change from 03/01/2017. Follow up studies when clinically  Indicated.   EKG   05/01/2021: Sinus tachycardia at a rate of 102 bpm.  Left atrial enlargement.  Left  axis, left anterior fascicular block.  Right bundle branch block.  Bifascicular block.  LVH.   External EKG 04/06/2021: Atrial fibrillation with controlled ventricular response at a rate of 102 bpm.   EKG normal 12/12/2020: Sinus bradycardia at rate of 58 bpm with first-degree AV block, left axis deviation, left anterior fascicular block.  Right bundle branch block.  Borderline criteria for LVH by voltage.  No change from 05/27/2020.  Assessment     ICD-10-CM   1. Paroxysmal atrial fibrillation (HCC)  I48.0 EKG 12-Lead    2. Essential hypertension  I10     3. ESRD on hemodialysis ([place])  N18.6    Z99.2      No orders of the defined types were placed in this encounter.  There are no discontinued medications.   This patients CHA2DS2-VASc Score 5 (CHF, HTN, DM, PAD, Age) and yearly risk of stroke 7.2%.   Recommendations:   [name]  is a 65 y.o. male  with severe peripheral arterial disease and has history of right below-knee amputation and toe lamputations on the left, multiple peripheral interventions in the past, uncontrolled diabetes mellitus, stage IV CKD, coronary artery disease angioplasty to the right coronary artery on 10/29/2014, right carotid endarterectomy in 2012 , OSA Unable to tolerate CPAP, chronic back pain and neck and back surgery in past and on chronic pain medications.  Diagnosed with paroxysmal atrial fibrillation in 04/2021, on anticoagulation.  Patient presents for return visit as he continues to have episodes of hypotension following dialysis.  EKG today reveals sinus tachycardia, likely as patient has not been taking carvedilol regularly on nondialysis days as directed.  Advised patient to continue to skip BiDil and carvedilol on dialysis days.  Will defer further management of hypotensive episodes during dialysis to patient's nephrologist.  Recommend patient take carvedilol 25 mg twice daily on nondialysis days and take BiDil 1 tablet 3 times daily.  Advised patient  that he may skip BiDil if systolic blood pressure <976 mmHg.  In regard to atrial fibrillation, given CHA2DS2-VASc recommend anticoagulation which she is tolerating without bleeding diathesis.  We will continue Eliquis.  Will improve rate control.  Advised patient to take carvedilol 25 mg twice daily on nondialysis days.  Follow-up in 6 weeks, sooner if needed, for hypertension.    Alethia Berthold, PA-C 05/01/2021, 3:17 PM Office: [phone]

## 2021-05-08 ENCOUNTER — Encounter: Payer: Self-pay | Admitting: Orthopedic Surgery

## 2021-05-08 ENCOUNTER — Ambulatory Visit (INDEPENDENT_AMBULATORY_CARE_PROVIDER_SITE_OTHER): Payer: 59 | Admitting: Physician Assistant

## 2021-05-08 DIAGNOSIS — L97929 Non-pressure chronic ulcer of unspecified part of left lower leg with unspecified severity: Secondary | ICD-10-CM

## 2021-05-08 DIAGNOSIS — I87332 Chronic venous hypertension (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: Secondary | ICD-10-CM

## 2021-05-08 NOTE — Progress Notes (Signed)
Office Visit Note   Patient: [name]           Date of Birth: [birth year]           MRN: [id number] Visit Date: 05/08/2021              Requested by: [name], PA-C [address] [place],  [place] [id number] PCP: [name], PA-C  Chief Complaint  Patient presents with   Left Leg - Pain, Follow-up      HPI: Patient presents today for his left lower leg pain.  He is back in his compression socks and feel other than a lot of dead skin flaking off he is doing quite well.  He seems to be having less swelling in his legs 2.  He is also requesting an addendum to Dr. Jess Barters previous note so that he can obtain his wheelchair.  He feels this would give him a lot more mobility.  Assessment & Plan: Visit Diagnoses: No diagnosis found.  Plan: Patient will follow-up in 5 weeks sooner if any concerns  Follow-Up Instructions: No follow-ups on file.   Ortho Exam  Patient is alert, oriented, no adenopathy, well-dressed, normal affect, normal respiratory effort. Left lower extremity significant decrease in swelling small ankle ulcer is at skin surface.  There is no surrounding cellulitis erythema or drainage.  Has a healthy wound bed.  No signs of infection  Imaging: No results found. No images are attached to the encounter.  Labs: Lab Results  Component Value Date   HGBA1C 5.7 (H) 09/16/2018   HGBA1C 7.7 (H) 05/15/2018   HGBA1C 7.1 (H) 01/07/2018   ESRSEDRATE 35 (H) 09/03/2016   ESRSEDRATE 111 (H) 05/11/2016   ESRSEDRATE 98 (H) 05/04/2016   CRP 1.2 (H) 09/03/2016   CRP 4.7 (H) 05/11/2016   CRP 8.0 (H) 05/04/2016   REPTSTATUS 11/15/2017 FINAL 11/10/2017   GRAMSTAIN  11/04/2017    ABUNDANT WBC PRESENT, PREDOMINANTLY PMN MODERATE GRAM NEGATIVE RODS MODERATE GRAM POSITIVE COCCI Performed at [facility], [place] [address], [place], [place] [address]    CULT  11/10/2017    NO GROWTH 5 DAYS Performed at [place] [address] [place], [place]  [address]    LABORGA STAPHYLOCOCCUS AUREUS 09/26/2014     Lab Results  Component Value Date   ALBUMIN 3.7 (L) 05/30/2020   ALBUMIN 2.9 (L) 06/19/2019   ALBUMIN 3.5 05/15/2018   PREALBUMIN 19.0 05/04/2016   PREALBUMIN 25.0 12/16/2015    Lab Results  Component Value Date   MG 1.9 05/24/2018   MG 2.1 05/23/2018   MG 1.3 (L) 05/22/2018   No results found for: VD25OH  Lab Results  Component Value Date   PREALBUMIN 19.0 05/04/2016   PREALBUMIN 25.0 12/16/2015   CBC EXTENDED Latest Ref Rng & Units 11/18/2020 10/17/2020 10/03/2020  WBC 4.0 - 10.5 K/uL 7.7 - -  RBC 4.22 - 5.81 MIL/uL 3.21(L) - -  HGB 13.0 - 17.0 g/dL 9.1(L) 9.8(L) 9.5(L)  HCT 39.0 - 52.0 % 28.7(L) - -  PLT 150 - 400 K/uL 149(L) - -  NEUTROABS 1.7 - 7.7 K/uL - - -  LYMPHSABS 0.7 - 4.0 K/uL - - -     There is no height or weight on file to calculate BMI.  Orders:  No orders of the defined types were placed in this encounter.  No orders of the defined types were placed in this encounter.    Procedures: No procedures performed  Clinical Data: No additional findings.  ROS:  All other systems negative, except as noted in the HPI. Review of Systems  Objective: Vital Signs: There were no vitals taken for this visit.  Specialty Comments:  No specialty comments available.  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   Pseudoclaudication 11/15/2018   Essential hypertension 09/18/2018   S/P BKA (below knee amputation), right ([place]) 09/18/2018   Amputation of toe of left foot ([place])  09/18/2018   Peripheral artery disease ([place]) 09/18/2018   S/P carotid endarterectomy 09/18/2018   OSA on CPAP 09/18/2018   Chronic back pain 09/18/2018   Status post reversal of ileostomy 05/21/2018   Normocytic anemia 02/14/2018   Intra-abdominal abscess ([place]) 11/04/2017   Chronic venous hypertension (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 ([place]) 12/11/2016   Unilateral primary osteoarthritis, left knee 10/11/2016   History of right below knee amputation ([place]) 07/12/2016   Diabetic foot infection ([place]) 05/04/2016   Type 2 diabetes mellitus with insulin therapy ([place]) 12/16/2015   Amputated toe ([place]) 10/21/2015   Leg edema, left 09/03/2015   CAD (dz of distal, mid and proximal RCA with implantation of 3 overlapping drug-eluting stent,) 09/03/2015   Chronic diastolic heart failure, NYHA class 2 ([place]) 09/03/2015   Angina pectoris associated with type 2 diabetes mellitus ([place]) 10/28/2014   Hyperlipidemia 08/25/2014   Limb pain 03/20/2013   DJD (degenerative joint disease) 09/25/2012   Migraine 09/25/2012   Neuropathy 09/25/2012   Restless legs syndrome (RLS) 09/25/2012   Chronic obstructive pulmonary disease, unspecified ([place]) 04/25/2012   Unknown cause of morbidity or mortality 04/25/2012   Chronic total occlusion of artery of the extremities ([place]) 04/08/2012   Onychomycosis 02/01/2012   Occlusion and stenosis of carotid artery without mention of cerebral infarction 06/12/2011   GERD (gastroesophageal reflux disease) 05/08/2011   Barrett's esophagus without dysplasia 05/08/2011   Former tobacco use 02/01/2011   Past Medical History:  Diagnosis Date   Carotid artery occlusion 11/10/10   LEFT CAROTID ENDARTERECTOMY   Chronic kidney disease    Complication of anesthesia    BP WENT UP AT [facility] "   COPD (chronic obstructive pulmonary disease) ([place])    pt denies this dx as of 06/01/20 - no inhaler    Diabetes mellitus without complication ([place])    Diverticulitis     Diverticulosis of colon (without mention of hemorrhage)    DJD (degenerative joint disease)    knees/hands/feet/back/neck   Fatty liver    Full dentures    GERD (gastroesophageal reflux disease)    H/O hiatal hernia    History of blood transfusion    with a past surical procedure per patient 06/01/20   Hyperlipidemia    Hypertension    Neuromuscular disorder ([place])    peripheral neuropathy   Non-pressure chronic ulcer of other part of left foot limited to breakdown of skin ([place]) 11/12/2016   Osteomyelitis ([facility])    left 5th metatarsal   PAD (peripheral artery disease) ([place])    Distal aortogram June 2012. Atherectomy left popliteal artery July 2012.    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) 09/16/2018   Wears glasses     Family History  Problem Relation Age of Onset   Heart disease Father        Before age 68-  CAD, BPG   Diabetes Father        Amputation   Cancer Father        PROSTATE   Hyperlipidemia Father    Hypertension Father    Heart attack Father        Triple BPG   Varicose Veins Father    Colon cancer Brother    Diabetes Brother    Heart disease Brother 18       A-Fib. Before age 46   Hyperlipidemia Brother    Hypertension Brother    Cancer Sister        Breast   Hyperlipidemia Sister    Hypertension Sister    Hypertension Son    Arthritis Other        GRANDMOTHER   Hypertension Other        OTHER FAMILY MEMBERS    Past Surgical History:  Procedure Laterality Date   AMPUTATION  11/05/2011   Procedure: AMPUTATION RAY;  Surgeon: Wylene Simmer, MD;  Location: [facility];  Service: Orthopedics;  Laterality: Right;  Amputation of Right 4&5th Toes   AMPUTATION Left 11/26/2012   Procedure: AMPUTATION RAY;  Surgeon: Wylene Simmer, MD;  Location: [facility];  Service: Orthopedics;  Laterality: Left;  fourth ray amputation    AMPUTATION Right 08/27/2014   Procedure: Transmetatarsal Amputation;  Surgeon: Newt Minion, MD;  Location: [facility];  Service: Orthopedics;  Laterality: Right;   AMPUTATION Right 01/14/2015   Procedure: AMPUTATION BELOW KNEE;  Surgeon: Newt Minion, MD;  Location: [facility];  Service: Orthopedics;  Laterality: Right;   AMPUTATION Left 10/21/2015   Procedure: Left Foot 5th Ray Amputation;  Surgeon: Newt Minion, MD;  Location: [facility];  Service: Orthopedics;  Laterality: Left;   ANTERIOR FUSION CERVICAL SPINE  02/06/06   C4-5, C5-6, C6-7; SURGEON DR. MAX COHEN   AV FISTULA PLACEMENT Left 06/02/2020   Procedure: ARTERIOVENOUS (AV) FISTULA CREATION LEFT;  Surgeon: Waynetta Sandy, MD;  Location: [facility];  Service: Vascular;  Laterality: Left;   BACK SURGERY     x 3   [place] Left 07/21/2020   Procedure: LEFT UPPER ARM ATERIOVENOUS SUPERFISTULALIZATION;  Surgeon: Waynetta Sandy, MD;  Location: [facility];  Service: Vascular;  Laterality: Left;   BELOW KNEE LEG AMPUTATION Right    CARDIAC CATHETERIZATION  10/31/04   2009   CAROTID ENDARTERECTOMY  11/10/10   CAROTID ENDARTERECTOMY Left 11/10/2010   Subtotal occlusion of left internal carotid artery with left hemispheric transient ischemic attacks.   CAROTID STENT     CARPAL TUNNEL RELEASE Right 10/21/2013   Procedure: RIGHT CARPAL TUNNEL RELEASE;  Surgeon: Wynonia Sours, MD;  Location: [facility];  Service: Orthopedics;  Laterality: Right;   CHOLECYSTECTOMY     COLON SURGERY     COLONOSCOPY     COLOSTOMY REVERSAL  05/21/2018   ileostomy reversal   CYSTOSCOPY WITH STENT PLACEMENT Bilateral 01/13/2018   Procedure: CYSTOSCOPY WITH BILATERAL URETERAL CATHETER PLACEMENT;  Surgeon: Ardis Hughs, MD;  [facility];  Service: Urology;  Laterality: Bilateral;  ESOPHAGEAL MANOMETRY Bilateral 07/19/2014   Procedure: ESOPHAGEAL MANOMETRY (EM);  Surgeon: Jerene Bears, MD;  [facility];  Service:  Gastroenterology;  Laterality: Bilateral;   EYE SURGERY Bilateral 2020   cataract   FEMORAL ARTERY STENT     x6   FINGER SURGERY     FOOT SURGERY  04/25/2016    EXCISION BASE 5TH METATARSAL AND PARTIAL CUBOID LEFT FOOT   HERNIA REPAIR     LEFT INGUINAL AND UMBILICAL REPAIRS   HERNIA REPAIR     I & D EXTREMITY Left 04/25/2016   Procedure: EXCISION BASE 5TH METATARSAL AND PARTIAL CUBOID LEFT FOOT;  Surgeon: Newt Minion, MD;  Location: [facility];  Service: Orthopedics;  Laterality: Left;   ILEOSTOMY  01/13/2018   Procedure: ILEOSTOMY;  Surgeon: Clovis Riley, MD;  Location[facility];  Service: General;;   ILEOSTOMY CLOSURE N/A 05/21/2018   Procedure: ILEOSTOMY REVERSAL ERAS PATHWAY;  Surgeon: Clovis Riley, MD;  Location: [facility];  Service: General;  Laterality: N/A;   IR RADIOLOGIST EVAL & MGMT  11/19/2017   IR RADIOLOGIST EVAL & MGMT  12/03/2017   IR RADIOLOGIST EVAL & MGMT  12/18/2017   JOINT REPLACEMENT Right 2001   Total knee   LAMINECTOMY     X 3 LUMBAR AND X 2 CERVICAL SPINE OPERATIONS   LAPAROSCOPIC CHOLECYSTECTOMY W/ CHOLANGIOGRAPHY  11/09/04   SURGEON DR. [place] Barreras CATH AND CORONARY ANGIOGRAPHY N/A 09/16/2018   Procedure: LEFT HEART CATH AND CORONARY ANGIOGRAPHY;  Surgeon: Nigel Mormon, MD;  Location: [facility];  Service: Cardiovascular;  Laterality: N/A;   LEFT HEART CATHETERIZATION WITH CORONARY ANGIOGRAM N/A 10/29/2014   Procedure: LEFT HEART CATHETERIZATION WITH CORONARY ANGIOGRAM;  Surgeon: Laverda Page, MD;  Location: [facility];  Service: Cardiovascular;  Laterality: N/A;   LIGATION OF COMPETING BRANCHES OF ARTERIOVENOUS FISTULA Left 07/21/2020   Procedure: LIGATION OF COMPETING BRANCHES OF LEFT UPPER ARM ARTERIOVENOUS FISTULA;  Surgeon: Waynetta Sandy, MD;  Location: [facility];  Service: Vascular;  Laterality: Left;   LOWER EXTREMITY ANGIOGRAM N/A 03/19/2012   Procedure: LOWER EXTREMITY ANGIOGRAM;  Surgeon: Burnell Blanks,  MD;  Location[facility];  Service: Cardiovascular;  Laterality: N/A;   NECK SURGERY     PARTIAL COLECTOMY N/A 01/13/2018   Procedure: LAPAROSCOPIC ASSISTED   SIGMOID COLECTOMY ILEOSTOMY;  Surgeon: Clovis Riley, MD;  [facility];  Service: General;  Laterality: N/A;   PENILE PROSTHESIS IMPLANT  08/14/05   INFRAPUBIC INSERTION OF INFLATABLE PENILE PROSTHESIS; SURGEON DR. Amalia Hailey   PENILE PROSTHESIS IMPLANT     PERCUTANEOUS CORONARY STENT INTERVENTION (PCI-S) Right 10/29/2014   Procedure: PERCUTANEOUS CORONARY STENT INTERVENTION (PCI-S);  Surgeon: Laverda Page, MD;  [facility];  Service: Cardiovascular;  Laterality: Right;   SHOULDER ARTHROSCOPY     SPINE SURGERY     TOE AMPUTATION Left    TONSILLECTOMY     TOTAL KNEE ARTHROPLASTY  07/2002   RIGHT KNEE ; SURGEON  DR. [name] ALSO HAD ARTHROSCOPIC RIGHT KNEE IN  10/2001   TOTAL KNEE ARTHROPLASTY     ULNAR NERVE TRANSPOSITION Right 10/21/2013   Procedure: RIGHT ELBOW  ULNAR NERVE DECOMPRESSION;  Surgeon: Wynonia Sours, MD;  Location: [facility];  Service: Orthopedics;  Laterality: Right;   Social History   Occupational History   Occupation: Magazine features editor: UNEMPLOYED  Tobacco Use   Smoking status: Former  Packs/day: 2.00    Years: 35.00    Pack years: 70.00    Types: Cigarettes    Quit date: 10/28/2011    Years since quitting: 9.5   Smokeless tobacco: Never  Vaping Use   Vaping Use: Some days   Substances: Nicotine  Substance and Sexual Activity   Alcohol use: Not Currently    Comment: "not in a long time"   Drug use: Never   Sexual activity: Yes    Birth control/protection: Implant    Comment: penile implant

## 2021-05-09 ENCOUNTER — Telehealth: Payer: Self-pay | Admitting: Orthopedic Surgery

## 2021-05-09 NOTE — Telephone Encounter (Signed)
Pts wife Delores came in asking for a rx for R leg below the knee sheaths, she states they are usually shear and the rx can be sent to [facility]. Delores would also like to speak to Autumn F in regards to having some insurance forms filled out for a hover around. Delores states the bottom part of the form needs to be filled out, and it needs to explain the pt has close to no mobility. She states the pt spends 3 days out of the week at dialysis and that last for 6 hours; the pt has a special cushion he uses but he still is getting ulcers from sitting so long. Delores did mention that the pt is only able to take about 2 steps but that's really only to use the restroom. Delores states he can't use a transfer board and he isn't to able pull himself up. Delores would like a CB ASAP when Autumn returns please and would like to have the rx sent in as soon as possible.

## 2021-05-09 NOTE — Telephone Encounter (Signed)
Patient's wife called. Would like Autumn to call her. Has more questions about the wheelchair RX, her call back number is [phone]

## 2021-05-10 ENCOUNTER — Telehealth: Payer: Self-pay

## 2021-05-10 NOTE — Telephone Encounter (Signed)
Pt called and would like a call back from [place] regarding his prosthetic and his leg

## 2021-05-16 ENCOUNTER — Telehealth: Payer: Self-pay

## 2021-05-16 NOTE — Telephone Encounter (Signed)
Denise @ Palliative care is asking for a referral for Palliative care

## 2021-05-18 ENCOUNTER — Telehealth: Payer: Self-pay | Admitting: Orthopedic Surgery

## 2021-05-18 NOTE — Telephone Encounter (Signed)
Patient's wife called. She would like Autumn to call her about a wheelchair RX. Her call back number is [phone]

## 2021-05-18 NOTE — Telephone Encounter (Signed)
Remind me in the morning. I will be happy to do this

## 2021-05-18 NOTE — Telephone Encounter (Signed)
This pt came into the office specifically for a face to face mobility device eval. I was not in the office but had the information for this pt attached to his chart for his appt 05/08/21. This was to start a new note for his hoover round wheelchair all the check list items are to be included in the note. The pt is now on dialysis and this exhausts him for much of the day he lacks the strength to self propel. He has the ability to transfer from chair to bed with assistance and that is all. Due to his dialysis days if there is a reclining feature available with the motorized chair that would be of most use to the pt. He has ulcers on his buttocks and anything that can help him change position even slightly per wife would be helpful. This needs to be an addendum to his 05/08/21 note the last one that [name] was trying to adjust is too far out. Please let me know when this has been done so that I can fax and notify pt's wife.

## 2021-05-19 ENCOUNTER — Other Ambulatory Visit: Payer: Self-pay | Admitting: Cardiology

## 2021-05-19 ENCOUNTER — Encounter (HOSPITAL_COMMUNITY): Payer: Self-pay

## 2021-05-19 ENCOUNTER — Telehealth: Payer: Self-pay | Admitting: Student

## 2021-05-19 DIAGNOSIS — I739 Peripheral vascular disease, unspecified: Secondary | ICD-10-CM

## 2021-05-19 DIAGNOSIS — N186 End stage renal disease: Secondary | ICD-10-CM

## 2021-05-19 DIAGNOSIS — I5032 Chronic diastolic (congestive) heart failure: Secondary | ICD-10-CM

## 2021-05-19 DIAGNOSIS — I951 Orthostatic hypotension: Secondary | ICD-10-CM

## 2021-05-19 NOTE — Telephone Encounter (Signed)
At the very end of the visit the patient asked me to add that item for the reclining chair to the list for Dr. Jess Barters addendum.  There are several points there that were not covered because it was not a mobility visit it was to look at his leg.  I discussed with Malachy Mood in she says it sounds like he needs to come in

## 2021-05-19 NOTE — Telephone Encounter (Signed)
Unfortunately when I saw him on the 29th it was not for a face-to-face it was for a recheck on his left lower extremity and he happened to mention getting the note addended at the end of his visit.  Reviewing what is required many of these things we did not cover and his purpose for that visit was not a mobility exam which the note says it specifically has to be.  We do not do O2 sats that day did not do a cardiopulmonary or neurologic exam.  So unfortunately he would have to come back in because I cannot document something that was not done.  That was not the reason of his visit that day

## 2021-05-19 NOTE — Telephone Encounter (Signed)
Spoke with wife, [name], regarding the Palliative referral/services and all questions were answered and she was in agreement with scheduling visit.  I have scheduled an In-home Consult for 06/05/21 @ 11 AM.

## 2021-05-19 NOTE — Telephone Encounter (Signed)
I attached a note to the chart advising that this was to be addressed during his visit. I also attached the hover round paperwork to the chart so that it would be addressed while the pt was in the office. The paperwork that was set back on my desk there was a notation from you that reads "reclining chair" so you had the information available to you during his visit, had the check list available to you during the visit, per patient advisement during the visit it was understood that he had the need for a new motorized chair. For the checklist from an orthopedic standpoint we are to document in the chart visit for that day the things that affect the pt mobility and his need for assistive device. Time has lapsed from the addendum that [name] made in his office note from 02/18/21. The patient is currently using a broken motorized chair and should not have a delay in processing for safe equipment because his forms and documentation on his snap shot were not addressed.

## 2021-05-19 NOTE — Telephone Encounter (Signed)
Dont forget about the orders for him thanks

## 2021-05-23 NOTE — Telephone Encounter (Signed)
I called and lm on vm for pt's wife to advise that the detailed note has been faxed to hoover round and that this soul be everything that they were asking for. If there are any other questions or concerns to please call and let me know.

## 2021-06-05 ENCOUNTER — Other Ambulatory Visit: Payer: Self-pay

## 2021-06-05 ENCOUNTER — Other Ambulatory Visit: Payer: Medicare Other | Admitting: Student

## 2021-06-05 ENCOUNTER — Telehealth: Payer: Self-pay | Admitting: Student

## 2021-06-05 NOTE — Telephone Encounter (Signed)
Palliative NP did speak with patient's wife and he is actually on his way to another appointment. Palliative appt rescheduled for 06/12/21 at 11am.

## 2021-06-05 NOTE — Telephone Encounter (Signed)
Palliative NP left messages on patient's number and wife cell listed confirming appointment for today. Awaiting return call.

## 2021-06-12 ENCOUNTER — Ambulatory Visit: Payer: 59 | Admitting: Cardiology

## 2021-06-12 ENCOUNTER — Ambulatory Visit (INDEPENDENT_AMBULATORY_CARE_PROVIDER_SITE_OTHER): Payer: 59 | Admitting: Orthopedic Surgery

## 2021-06-12 ENCOUNTER — Other Ambulatory Visit: Payer: Self-pay

## 2021-06-12 ENCOUNTER — Other Ambulatory Visit: Payer: Medicare Other | Admitting: Student

## 2021-06-12 ENCOUNTER — Encounter: Payer: Self-pay | Admitting: Orthopedic Surgery

## 2021-06-12 ENCOUNTER — Encounter: Payer: Self-pay | Admitting: Cardiology

## 2021-06-12 VITALS — BP 183/40 | HR 62 | Temp 98.5°F | Resp 17 | Ht 74.0 in | Wt 239.0 lb

## 2021-06-12 DIAGNOSIS — I96 Gangrene, not elsewhere classified: Secondary | ICD-10-CM | POA: Diagnosis not present

## 2021-06-12 DIAGNOSIS — L97929 Non-pressure chronic ulcer of unspecified part of left lower leg with unspecified severity: Secondary | ICD-10-CM | POA: Diagnosis not present

## 2021-06-12 DIAGNOSIS — Z89511 Acquired absence of right leg below knee: Secondary | ICD-10-CM

## 2021-06-12 DIAGNOSIS — I2510 Atherosclerotic heart disease of native coronary artery without angina pectoris: Secondary | ICD-10-CM

## 2021-06-12 DIAGNOSIS — I739 Peripheral vascular disease, unspecified: Secondary | ICD-10-CM

## 2021-06-12 DIAGNOSIS — I87332 Chronic venous hypertension (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: Secondary | ICD-10-CM | POA: Diagnosis not present

## 2021-06-12 DIAGNOSIS — I480 Paroxysmal atrial fibrillation: Secondary | ICD-10-CM

## 2021-06-12 DIAGNOSIS — L97522 Non-pressure chronic ulcer of other part of left foot with fat layer exposed: Secondary | ICD-10-CM

## 2021-06-12 DIAGNOSIS — N186 End stage renal disease: Secondary | ICD-10-CM

## 2021-06-12 MED ORDER — SULFAMETHOXAZOLE-TRIMETHOPRIM 800-160 MG PO TABS: 1.0000 | ORAL_TABLET | Freq: Two times a day (BID) | ORAL | 0 refills | Status: DC

## 2021-06-12 MED ORDER — NITROGLYCERIN 0.2 MG/HR TD PT24: 0.2000 mg | MEDICATED_PATCH | Freq: Every day | TRANSDERMAL | 12 refills | Status: DC

## 2021-06-12 MED ORDER — SULFAMETHOXAZOLE-TRIMETHOPRIM 800-160 MG PO TABS
1.0000 | ORAL_TABLET | Freq: Two times a day (BID) | ORAL | 0 refills | Status: DC
Start: 2021-06-12 — End: 2021-06-30

## 2021-06-12 NOTE — Progress Notes (Signed)
Primary Physician/Referring:  [name], PA-C  Patient ID: [name], male    DOB: 1956/03/30, 65 y.o.   MRN: [id number]  Chief Complaint  Patient presents with   Coronary Artery Disease   PAD   Follow-up    6 months   chronic diastolic heart failure   HPI:    [name]  is a 65 y.o. male  with severe peripheral arterial disease and has history of right below-knee amputation and toe lamputations on the left, multiple peripheral interventions in the past, uncontrolled diabetes mellitus, ESRD, severe diabetic retinopathy, coronary artery disease angioplasty to the right coronary artery on 10/29/2014, right carotid endarterectomy in 2012 , OSA Unable to tolerate CPAP, chronic back pain and neck and back surgery in past and on chronic pain medications. Coronary Angiography 09/17/2018 revealed widely patent stent and no significant new disease.    Patient presents for 71-month follow-up of coronary artery disease, new onset atrial fibrillation and peripheral arterial disease.  He was found to be in A. fib over a month ago by his PCP and was started on Eliquis.  He is now developed left foot ulcer which is not healing and [name] is contemplating amputation.  He was prescribed transdermal nitroglycerin for this to see whether his wound would improve.  He is now in a wheelchair essentially and has stopped ambulating.  He is now on hemodialysis.  He does continue to use e-cigarettes regularly.    Past Medical History:  Diagnosis Date   Carotid artery occlusion 11/10/10   LEFT CAROTID ENDARTERECTOMY   Chronic kidney disease    Complication of anesthesia    BP WENT UP AT [facility] "   COPD (chronic obstructive pulmonary disease) ([place])    pt denies this dx as of 06/01/20 - no inhaler    Diabetes mellitus without complication ([place])    Diverticulitis    Diverticulosis of colon (without mention of hemorrhage)    DJD (degenerative joint disease)    knees/hands/feet/back/neck   Fatty liver     Full dentures    GERD (gastroesophageal reflux disease)    H/O hiatal hernia    History of blood transfusion    with a past surical procedure per patient 06/01/20   Hyperlipidemia    Hypertension    Neuromuscular disorder ([place])    peripheral neuropathy   Non-pressure chronic ulcer of other part of left foot limited to breakdown of skin ([place]) 11/12/2016   Osteomyelitis (HCC)    left 5th metatarsal   PAD (peripheral artery disease) ([place])    Distal aortogram June 2012. Atherectomy left popliteal artery July 2012.    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   Unstable angina ([place]) 09/16/2018   Wears glasses    Past Surgical History:  Procedure Laterality Date   AMPUTATION  11/05/2011   Procedure: AMPUTATION RAY;  Surgeon: Wylene Simmer, MD;  Location: [place];  Service: Orthopedics;  Laterality: Right;  Amputation of Right 4&5th Toes   AMPUTATION Left 11/26/2012   Procedure: AMPUTATION RAY;  Surgeon: Wylene Simmer, MD;  Location: [place];  Service: Orthopedics;  Laterality: Left;  fourth ray amputation   AMPUTATION Right 08/27/2014   Procedure: Transmetatarsal Amputation;  Surgeon: Newt Minion, MD;  Location: [place];  Service: Orthopedics;  Laterality: Right;   AMPUTATION Right 01/14/2015  Procedure: AMPUTATION BELOW KNEE;  Surgeon: Newt Minion, MD;  Location: [place];  Service: Orthopedics;  Laterality: Right;   AMPUTATION Left 10/21/2015   Procedure: Left Foot 5th Ray Amputation;  Surgeon: Newt Minion, MD;  Location: [place];  Service: Orthopedics;  Laterality: Left;   ANTERIOR FUSION CERVICAL SPINE  02/06/06   C4-5, C5-6, C6-7; SURGEON DR. MAX COHEN   AV FISTULA PLACEMENT Left 06/02/2020   Procedure: ARTERIOVENOUS (AV) FISTULA CREATION LEFT;  Surgeon: Waynetta Sandy, MD;  Location: [place];  Service: Vascular;  Laterality: Left;   BACK SURGERY     x 3   [place] Left 07/21/2020   Procedure: LEFT UPPER ARM ATERIOVENOUS SUPERFISTULALIZATION;  Surgeon: Waynetta Sandy, MD;  Location: [place];  Service: Vascular;  Laterality: Left;   BELOW KNEE LEG AMPUTATION Right    CARDIAC CATHETERIZATION  10/31/04   2009   CAROTID ENDARTERECTOMY  11/10/10   CAROTID ENDARTERECTOMY Left 11/10/2010   Subtotal occlusion of left internal carotid artery with left hemispheric transient ischemic attacks.   CAROTID STENT     CARPAL TUNNEL RELEASE Right 10/21/2013   Procedure: RIGHT CARPAL TUNNEL RELEASE;  Surgeon: Wynonia Sours, MD;  Location: [place];  Service: Orthopedics;  Laterality: Right;   CHOLECYSTECTOMY     COLON SURGERY     COLONOSCOPY     COLOSTOMY REVERSAL  05/21/2018   ileostomy reversal   CYSTOSCOPY WITH STENT PLACEMENT Bilateral 01/13/2018   Procedure: CYSTOSCOPY WITH BILATERAL URETERAL CATHETER PLACEMENT;  Surgeon: Ardis Hughs, MD;  Location: [facility];  Service: Urology;  Laterality: Bilateral;   ESOPHAGEAL MANOMETRY Bilateral 07/19/2014   Procedure: ESOPHAGEAL MANOMETRY (EM);  Surgeon: Jerene Bears, MD;  [facility];  Service: Gastroenterology;  Laterality: Bilateral;   EYE SURGERY Bilateral 2020   cataract   FEMORAL ARTERY STENT     x6   FINGER SURGERY     FOOT SURGERY  04/25/2016    EXCISION BASE 5TH METATARSAL AND PARTIAL CUBOID LEFT FOOT   HERNIA REPAIR     LEFT INGUINAL AND UMBILICAL REPAIRS   HERNIA REPAIR     I & D EXTREMITY Left 04/25/2016   Procedure: EXCISION BASE 5TH METATARSAL AND PARTIAL CUBOID LEFT FOOT;  Surgeon: Newt Minion, MD;  Location: [place];  Service: Orthopedics;  Laterality: Left;   ILEOSTOMY  01/13/2018   Procedure: ILEOSTOMY;  Surgeon: Clovis Riley, MD;  Location[facility];  Service: General;;   ILEOSTOMY CLOSURE N/A 05/21/2018   Procedure: ILEOSTOMY REVERSAL ERAS PATHWAY;  Surgeon: Clovis Riley, MD;  Location: [place];  Service: General;  Laterality: N/A;   IR RADIOLOGIST  EVAL & MGMT  11/19/2017   IR RADIOLOGIST EVAL & MGMT  12/03/2017   IR RADIOLOGIST EVAL & MGMT  12/18/2017   JOINT REPLACEMENT Right 2001   Total knee   LAMINECTOMY     X 3 LUMBAR AND X 2 CERVICAL SPINE OPERATIONS   LAPAROSCOPIC CHOLECYSTECTOMY W/ CHOLANGIOGRAPHY  11/09/04   SURGEON DR. [place] CATH AND CORONARY ANGIOGRAPHY N/A 09/16/2018   Procedure: LEFT HEART CATH AND CORONARY ANGIOGRAPHY;  Surgeon: Nigel Mormon, MD;  Location: [place] [facility];  Service: Cardiovascular;  Laterality: N/A;   LEFT HEART CATHETERIZATION WITH CORONARY ANGIOGRAM N/A 10/29/2014   Procedure: LEFT HEART CATHETERIZATION WITH CORONARY ANGIOGRAM;  Surgeon: Laverda Page, MD;  Location[facility] C[facility];  Service: Cardiovascular;  Laterality: N/A;  LIGATION OF COMPETING BRANCHES OF ARTERIOVENOUS FISTULA Left 07/21/2020   Procedure: LIGATION OF COMPETING BRANCHES OF LEFT UPPER ARM ARTERIOVENOUS FISTULA;  Surgeon: Waynetta Sandy, MD;  Location: [place];  Service: Vascular;  Laterality: Left;   LOWER EXTREMITY ANGIOGRAM N/A 03/19/2012   Procedure: LOWER EXTREMITY ANGIOGRAM;  Surgeon: Burnell Blanks, MD;  Location: [facility] CATH LAB;  Service: Cardiovascular;  Laterality: N/A;   NECK SURGERY     PARTIAL COLECTOMY N/A 01/13/2018   Procedure: LAPAROSCOPIC ASSISTED   SIGMOID COLECTOMY ILEOSTOMY;  Surgeon: Clovis Riley, MD;  Location[facility];  Service: General;  Laterality: N/A;   PENILE PROSTHESIS IMPLANT  08/14/05   INFRAPUBIC INSERTION OF INFLATABLE PENILE PROSTHESIS; SURGEON DR. Amalia Hailey   PENILE PROSTHESIS IMPLANT     PERCUTANEOUS CORONARY STENT INTERVENTION (PCI-S) Right 10/29/2014   Procedure: PERCUTANEOUS CORONARY STENT INTERVENTION (PCI-S);  Surgeon: Laverda Page, MD;  Location: [facility] CATH LAB;  Service: Cardiovascular;  Laterality: Right;   SHOULDER ARTHROSCOPY     SPINE SURGERY     TOE AMPUTATION Left    TONSILLECTOMY     TOTAL KNEE ARTHROPLASTY  07/2002   RIGHT KNEE ; SURGEON   DR. [name] ALSO HAD ARTHROSCOPIC RIGHT KNEE IN  10/2001   TOTAL KNEE ARTHROPLASTY     ULNAR NERVE TRANSPOSITION Right 10/21/2013   Procedure: RIGHT ELBOW  ULNAR NERVE DECOMPRESSION;  Surgeon: Wynonia Sours, MD;  Location: [place];  Service: Orthopedics;  Laterality: Right;   Social History   Tobacco Use   Smoking status: Former    Packs/day: 2.00    Years: 35.00    Pack years: 70.00    Types: Cigarettes    Quit date: 10/28/2011    Years since quitting: 9.6   Smokeless tobacco: Never  Substance Use Topics   Alcohol use: Not Currently    Comment: "not in a long time"   Marital Status: Married   ROS  Review of Systems  Cardiovascular:  Positive for leg swelling. Negative for chest pain, dyspnea on exertion, palpitations and syncope.  Hematologic/Lymphatic: Does not bruise/bleed easily.  Musculoskeletal:  Positive for arthritis and back pain.  Gastrointestinal:  Negative for melena.  Objective   Vitals with BMI 06/12/2021 06/12/2021 05/01/2021  Height - 6\' 2"  -  Weight - 239 lbs -  BMI - 66.44 -  Systolic 034 742 595  Diastolic 40 69 72  Pulse 62 58 102    Blood pressure (!) 183/40, pulse 62, temperature 98.5 F (36.9 C), temperature source Temporal, resp. rate 17, height 6\' 2"  (1.88 m), weight 239 lb (108.4 kg), SpO2 100 %. Body mass index is 30.69 kg/m.   Physical Exam Constitutional:      General: He is not in acute distress.    Appearance: He is obese.     Comments: Well built and moderately obese in no acute distress  Neck:     Vascular: Carotid bruit (bilateral) present. No JVD.  Cardiovascular:     Rate and Rhythm: Regular rhythm. Tachycardia present.     Pulses: Intact distal pulses.          Femoral pulses are 1+ on the right side with bruit and 1+ on the left side with bruit.      Popliteal pulses are 0 on the right side and 0 on the left side.       Dorsalis pedis pulses are 0 on the left side. Right dorsalis pedis pulse not accessible.  Posterior tibial pulses are 0 on the left side. Right posterior tibial pulse not accessible.     Heart sounds: Normal heart sounds. No murmur heard.   No gallop.     Comments: Right BKA. There is 1-2+ left leg pitting edema below knee. Left brachial artery AV Shunt noted Pulmonary:     Effort: Pulmonary effort is normal.     Breath sounds: Normal breath sounds.  Abdominal:     General: Bowel sounds are normal.     Palpations: Abdomen is soft.  Musculoskeletal:     Cervical back: Neck supple.   Radiology: No results found.  Laboratory examination:   Recent Labs    07/21/20 0721 11/18/20 1831  NA 140 138  K 4.0 4.2  CL 107 108  CO2  --  23  GLUCOSE 163* 80  BUN 38* 45*  CREATININE 4.90* 4.71*  CALCIUM  --  8.2*  GFRNONAA  --  13*   CMP Latest Ref Rng & Units 11/18/2020 07/21/2020 06/02/2020  Glucose 70 - 99 mg/dL 80 163(H) 130(H)  BUN 8 - 23 mg/dL 45(H) 38(H) 32(H)  Creatinine 0.61 - 1.24 mg/dL 4.71(H) 4.90(H) 5.10(H)  Sodium 135 - 145 mmol/L 138 140 139  Potassium 3.5 - 5.1 mmol/L 4.2 4.0 3.5  Chloride 98 - 111 mmol/L 108 107 106  CO2 22 - 32 mmol/L 23 - -  Calcium 8.9 - 10.3 mg/dL 8.2(L) - -  Total Protein 6.0 - 8.5 g/dL - - -  Total Bilirubin 0.0 - 1.2 mg/dL - - -  Alkaline Phos 44 - 121 IU/L - - -  AST 0 - 40 IU/L - - -  ALT 0 - 44 IU/L - - -   CBC Latest Ref Rng & Units 11/18/2020 10/17/2020 10/03/2020  WBC 4.0 - 10.5 K/uL 7.7 - -  Hemoglobin 13.0 - 17.0 g/dL 9.1(L) 9.8(L) 9.5(L)  Hematocrit 39.0 - 52.0 % 28.7(L) - -  Platelets 150 - 400 K/uL 149(L) - -   Lipid Panel     Component Value Date/Time   CHOL 117 05/30/2020 1646   TRIG 133 05/30/2020 1646   HDL 24 (L) 05/30/2020 1646   LDLCALC 69 05/30/2020 1646   HEMOGLOBIN A1C Lab Results  Component Value Date   HGBA1C 5.7 (H) 09/16/2018   MPG 116.89 09/16/2018   External labs:  A1C 6.100 % 03/30/2020   Hemoglobin 10.800 05/23/2020 Platelets 203.000 x1 05/02/2020  Creatinine, Serum 4.140 mg/  05/02/2020 Potassium 4.300 mm 05/02/2020 ALT (SGPT) 16.000 IU/ 05/02/2020  Cholesterol, total 142.000 m 08/19/2018 HDL 32.000 mg 08/19/2018 LDL 79.000 mg 08/19/2018 Triglycerides 156.000 m 08/19/2018  A1C 6.900 % 09/02/2019  Creatinine, Serum 3.590 mg/ 10/05/2019 Potassium 4.300 mm 10/05/2019 ALT (SGPT) 12.000 IU/ 10/05/2019  Hemoglobin 9.700 11/09/2019; INR 1.000 01/07/2018 Platelets 232.000 x1 10/05/2019  Medications   Current Outpatient Medications on File Prior to Visit  Medication Sig Dispense Refill   amLODipine (NORVASC) 5 MG tablet Take 5 mg by mouth daily.     apixaban (ELIQUIS) 5 MG TABS tablet Take 5 mg by mouth 2 (two) times daily.     atorvastatin (LIPITOR) 40 MG tablet Take 40 mg by mouth at bedtime.      BIDIL 20-37.5 MG tablet Take 1 tablet by mouth 3 (three) times daily. Do not take on dialysis days (Tuesday, Thursday, Saturday)      carvedilol (COREG) 25 MG tablet Take 1.5 tablets (37.5 mg total) by mouth 2 (two) times daily. (Patient taking differently: Take 25  mg by mouth 2 (two) times daily. 1 AND A HALF TAB BID) 270 tablet 2   famotidine (PEPCID) 40 MG tablet Take 40 mg by mouth daily.     fentaNYL (DURAGESIC - DOSED MCG/HR) 50 MCG/HR Place 50 mcg onto the skin every 3 (three) days.     Glucosamine HCl (GLUCOSAMINE PO) Take 100 tablets by mouth 2 (two) times daily.     HUMALOG KWIKPEN 100 UNIT/ML KiwkPen Inject 15-16 Units into the skin See admin instructions. Inject 13 units before breakfast, lunch, & dinner--  0   insulin glargine (LANTUS) 100 UNIT/ML injection Inject 55 Units into the skin at bedtime.     ketoconazole (NIZORAL) 2 % cream Apply 1 application topically 2 (two) times daily as needed for irritation.   1   linaclotide (LINZESS) 145 MCG CAPS capsule Take 145 mcg by mouth daily as needed (constipation).      loratadine (CLARITIN) 10 MG tablet Take 10 mg by mouth daily.     NEEDLE, REUSABLE, 22 G 22G X 1-1/2" MISC 1 Units by Does not apply route as directed.  For B12 IM inj 10 each 0   NOVOLOG FLEXPEN 100 UNIT/ML FlexPen Inject 13 Units into the skin daily.     omega-3 acid ethyl esters (LOVAZA) 1 g capsule Take 2 g by mouth 2 (two) times daily.     ondansetron (ZOFRAN) 4 MG tablet Take 4 mg by mouth every 6 (six) hours as needed for nausea or vomiting.     oxyCODONE-acetaminophen (PERCOCET) 7.5-325 MG tablet Take 1 tablet by mouth 3 (three) times daily as needed.     pantoprazole (PROTONIX) 40 MG tablet Take 40 mg by mouth daily.     Polyvinyl Alcohol-Povidone PF 1.4-0.6 % SOLN Place 1 drop into both eyes 2 (two) times daily as needed (dry eyes).      pregabalin (LYRICA) 100 MG capsule Take 1 capsule (100 mg total) by mouth 3 (three) times daily. 30 capsule 0   rOPINIRole (REQUIP) 2 MG tablet Take 2 mg by mouth at bedtime.     Syringe/Needle, Disp, (SYRINGE 3CC/22GX1-1/2") 22G X 1-1/2" 3 ML MISC 1 Syringe by Does not apply route as directed. For b12 IM inj 10 each 0   tamsulosin (FLOMAX) 0.4 MG CAPS capsule Take 0.4 mg by mouth daily.     torsemide (DEMADEX) 20 MG tablet Take 20 mg by mouth in the morning and at bedtime. Take on extra for swelling     No current facility-administered medications on file prior to visit.    Cardiac Studies:   Left popliteal artery atherectomy in June 2012 and left below knee PTA 2014 by Dr. Brunetta Jeans at [place].   Lexiscan sestamibi stress test 03/18/2017: 1. The resting electrocardiogram demonstrated normal sinus rhythm, RBBB and no resting arrhythmias. Stress EKG is non-diagnostic for ischemia as it a pharmacologic stress using Lexiscan. Occasional PVC noted. Stress symptoms included dyspnea, dizziness and chest pain. 2. The LV is dilated both at rest and stress images. The LV end diastolic volume was 656CL. Perfusion images reveal inferior soft tissue attenuation artifact. There is no ischemia or scar. Overall left ventricular systolic function was normal without regional wall motion abnormalities. The left  ventricular ejection fraction was calculated to be 51%. This is a low risk study. Compared to the study done on 10/15/2014, inferior wall ischemia and associated inferior hypokinesis is no longer present.  Coronary angiogram 09/17/18: Widely patent stent stents. Normal LVEF. Coronary Angiogram 10/29/2014: CAD s/p  stenting of the distal, mid and proximal RCA with implantation of 3 overlapping drug-eluting stent, from distal to proximal 2.5 x 38 mm, 2.75 x 38 mm and a 2.75 x 20 mm Promus premier DES.   Echocardiogram 09/16/2018:   Left ventricle: The cavity size was normal. There was moderate concentric hypertrophy. Systolic function was normal. The estimated ejection fraction was in the range of 55% to 60%. Features are consistent with a pseudonormal left ventricular filling pattern, with concomitant abnormal relaxation and increased filling pressure (grade 2 diastolic dysfunction). Doppler parameters are consistent with both elevated ventricular end-diastolic filling pressure and elevated left atrial filling pressure. Mitral valve: Calcified annulus.  Left atrium: The atrium was mildly dilated. Inferior vena cava: The vessel was dilated. The respirophasic diameter changes was normal, may suggest elevated central venous pressure.  Carotid artery duplex  06/09/2019:  Minimal stenosis in bilateral ICA of 1-15%. Heterogeneous plaque. Stenosis  in the right external carotid artery (<50%).  Left carotid endarterectomy site is patent.  Right vertebral artery flow is not well visualized. Antegrade left  vertebral artery flow.  No significant change from 03/01/2017. Follow up studies when clinically  Indicated.  Lower extremity arterial duplex 08/19/2020: Right:  BKA.  Left: Monophasic waveforms throughout the left SFA and in small vessels.  The left toe-brachial index is abnormal.  No obvious stenosis identified from the common femoral artery to the  popliteal artery. TBI 0.58   EKG:    EKG normal  12/12/2020: Sinus bradycardia at rate of 58 bpm with first-degree AV block, left axis deviation, left anterior fascicular block.  Right bundle branch block.  Borderline criteria for LVH by voltage.  No change from 05/27/2020.  Assessment     ICD-10-CM   1. Peripheral artery disease (HCC)  I73.9     2. 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 Paroxysmal atrial fibrillation (HCC)  I48.0 EKG 12-Lead    PCV ECHOCARDIOGRAM COMPLETE    4. ESRD on hemodialysis (HCC)  N18.6    Z99.2     5. Coronary artery disease involving native coronary artery of native heart without angina pectoris  I25.10      No orders of the defined types were placed in this encounter.  Medications Discontinued During This Encounter  Medication Reason   B Complex-C-Zn-Folic Acid (DIALYVITE/ZINC) TABS Error   nitroGLYCERIN (NITROSTAT) 0.4 MG SL tablet Error   cetirizine (ZYRTEC) 10 MG tablet Duplicate    Recommendations:   NAKEEM MURNANE  is a 65 y.o. male  with severe peripheral arterial disease and has history of right below-knee amputation and toe lamputations on the left, multiple peripheral interventions in the past, uncontrolled diabetes mellitus, stage IV CKD, coronary artery disease angioplasty to the right coronary artery on 10/29/2014, right carotid endarterectomy in 2012 , OSA Unable to tolerate CPAP, chronic back pain and neck and back surgery in past and on chronic pain medications.  About a month ago he was noted to be in A. fib with RVR, was started on Eliquis by his PCP.  He is today in atypical atrial flutter.  I will obtain an echocardiogram.  As patient is asymptomatic from A. fib standpoint, I will not contemplate cardioversion for now, as peripheral arteriogram is needed which would mean discontinuation of anticoagulation preprocedurally.  Today's heart rate is elevated, blood pressure is also elevated, however patient is on  dialysis and blood pressure  have been fairly stable and I did not make any changes to his medications.  He has also been prescribed nitroglycerin patch to apply to his left leg which now again unfortunately has developed a open wound.    [name] plans to evaluate him closely and patient states that he is considering amputation.  In view of abnormal lower extremity arterial duplex that was done last year, I think prior to consideration for amputation, we should proceed with peripheral arteriogram to limit tissue loss.  Patient is willing to proceed with this.  I will obtain ABI.  Complications of peripheral arteriogram discussed with the patient, patient's son is also present at the bedside.  With regard to coronary artery disease, he has not had any heart failure or symptoms of angina pectoris.  He still continues to vape pretty heavily, we discussed regarding progression of PAD related to his vaping and also diabetes mellitus.  We also discussed regarding CODE STATUS, he prefers to be full code for now but states that in the event that he has an irreversible life-threatening event, we could certainly make him DNR and comfort care.   I will see him back in 4 to 6 weeks for follow-up.   Adrian Prows, MD, [facility] 06/12/2021, 4:35 PM Office: [phone]

## 2021-06-12 NOTE — Progress Notes (Signed)
Office Visit Note   Patient: [name]           Date of Birth: 1955-11-20           MRN: [id number] Visit Date: 06/12/2021              Requested by: [name], PA-C [address] [place],  [place] [address] PCP: [name], PA-C  Chief Complaint  Patient presents with   Left Leg - Pain      HPI: Patient is a 65 year old gentleman who presents in follow-up for venous insufficiency left lower extremity.  Patient has previously had a ischemic ulcer over the lateral malleolus.  Patient presents with a new gangrenous ulcer over the base of the lateral left foot.  Patient states that his atrial fibrillation has recently gotten worse.  Assessment & Plan: Visit Diagnoses:  1. Chronic venous hypertension (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 (HCC)   2. History of right below knee amputation ([place])   3. Gangrene of left foot ([place])     Plan: We will start a nitroglycerin patch to try to improve the microcirculation he will do dry dressing changes daily.  He will follow-up with his cardiologist to evaluate the A. fib.  Follow-up next Wednesday.  Discussed that with the necrotic wound at the base of the foot patient may require a transtibial amputation the left similar to the right.  Follow-Up Instructions: Return in about 1 week (around 06/19/2021).   Ortho Exam  Patient is alert, oriented, no adenopathy, well-dressed, normal affect, normal respiratory effort. Examination the ulcer over the lateral malleolus has completely healed.  Patient has a new ischemic gangrenous ulcer over the base of the fifth metatarsal as well as over the cuboid.  This measures 2 x 5 cm.  There is no ascending cellulitis.  The Doppler was used and patient does have a biphasic posterior tibial pulse however during his atrial fibrillation patient has some monophasic flow due to the rapid A. fib.  Patient has monophasic flow over the anterior tibial artery.  Imaging: No  results found. No images are attached to the encounter.  Labs: Lab Results  Component Value Date   HGBA1C 5.7 (H) 09/16/2018   HGBA1C 7.7 (H) 05/15/2018   HGBA1C 7.1 (H) 01/07/2018   ESRSEDRATE 35 (H) 09/03/2016   ESRSEDRATE 111 (H) 05/11/2016   ESRSEDRATE 98 (H) 05/04/2016   CRP 1.2 (H) 09/03/2016   CRP 4.7 (H) 05/11/2016   CRP 8.0 (H) 05/04/2016   REPTSTATUS 11/15/2017 FINAL 11/10/2017   GRAMSTAIN  11/04/2017    ABUNDANT WBC PRESENT, PREDOMINANTLY PMN MODERATE GRAM NEGATIVE RODS MODERATE GRAM POSITIVE COCCI Performed at [facility], [place] [address], [place], [place] [address]    CULT  11/10/2017    NO GROWTH 5 DAYS Performed at [facility] [address], [place], [place] [address]    LABORGA STAPHYLOCOCCUS AUREUS 09/26/2014     Lab Results  Component Value Date   ALBUMIN 3.7 (L) 05/30/2020   ALBUMIN 2.9 (L) 06/19/2019   ALBUMIN 3.5 05/15/2018   PREALBUMIN 19.0 05/04/2016   PREALBUMIN 25.0 12/16/2015    Lab Results  Component Value Date   MG 1.9 05/24/2018   MG 2.1 05/23/2018   MG 1.3 (L) 05/22/2018   No results found for: VD25OH  Lab Results  Component Value Date   PREALBUMIN 19.0 05/04/2016   PREALBUMIN 25.0 12/16/2015   CBC EXTENDED Latest Ref Rng & Units  11/18/2020 10/17/2020 10/03/2020  WBC 4.0 - 10.5 K/uL 7.7 - -  RBC 4.22 - 5.81 MIL/uL 3.21(L) - -  HGB 13.0 - 17.0 g/dL 9.1(L) 9.8(L) 9.5(L)  HCT 39.0 - 52.0 % 28.7(L) - -  PLT 150 - 400 K/uL 149(L) - -  NEUTROABS 1.7 - 7.7 K/uL - - -  LYMPHSABS 0.7 - 4.0 K/uL - - -     There is no height or weight on file to calculate BMI.  Orders:  No orders of the defined types were placed in this encounter.  Meds ordered this encounter  Medications   nitroGLYCERIN (NITRODUR - DOSED IN MG/24 HR) 0.2 mg/hr patch    Sig: Place 1 patch (0.2 mg total) onto the skin daily.    Dispense:  30 patch    Refill:  12   sulfamethoxazole-trimethoprim (BACTRIM DS) 800-160 MG tablet    Sig: Take 1 tablet  by mouth 2 (two) times daily.    Dispense:  20 tablet    Refill:  0     Procedures: No procedures performed  Clinical Data: No additional findings.  ROS:  All other systems negative, except as noted in the HPI. Review of Systems  Objective: Vital Signs: There were no vitals taken for this visit.  Specialty Comments:  No specialty comments available.  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   Pseudoclaudication 11/15/2018   Essential hypertension 09/18/2018   S/P BKA (below knee amputation), right ([place]) 09/18/2018   Amputation of toe of left foot ([place]) 09/18/2018   Peripheral artery disease ([place]) 09/18/2018   S/P carotid endarterectomy 09/18/2018   OSA on CPAP 09/18/2018   Chronic back pain 09/18/2018   Status post reversal of ileostomy 05/21/2018   Normocytic anemia 02/14/2018   Intra-abdominal abscess ([place]) 11/04/2017   Chronic venous hypertension (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 ([place]) 12/11/2016   Unilateral primary osteoarthritis, left knee 10/11/2016   History of right below knee amputation ([place]) 07/12/2016   Diabetic foot infection ([place]) 05/04/2016   Type 2 diabetes mellitus with insulin therapy ([place]) 12/16/2015   Amputated toe ([place]) 10/21/2015   Leg edema, left 09/03/2015   CAD (dz of distal, mid and proximal RCA with implantation of 3 overlapping drug-eluting stent,) 09/03/2015   Chronic diastolic heart failure, NYHA class 2 ([place]) 09/03/2015   Angina pectoris associated with type 2 diabetes mellitus  ([place]) 10/28/2014   Hyperlipidemia 08/25/2014   Limb pain 03/20/2013   DJD (degenerative joint disease) 09/25/2012   Migraine 09/25/2012   Neuropathy 09/25/2012   Restless legs syndrome (RLS) 09/25/2012   Chronic obstructive pulmonary disease, unspecified ([place]) 04/25/2012   Unknown cause of morbidity or mortality 04/25/2012   Chronic total occlusion of artery of the extremities ([place]) 04/08/2012   Onychomycosis 02/01/2012   Occlusion and stenosis of carotid artery without mention of cerebral infarction 06/12/2011   GERD (gastroesophageal reflux disease) 05/08/2011   Barrett's esophagus without dysplasia 05/08/2011   Former tobacco use 02/01/2011   Past Medical History:  Diagnosis Date   Carotid artery occlusion 11/10/10   LEFT CAROTID ENDARTERECTOMY   Chronic  kidney disease    Complication of anesthesia    BP WENT UP AT [facility] "   COPD (chronic obstructive pulmonary disease) ([place])    pt denies this dx as of 06/01/20 - no inhaler    Diabetes mellitus without complication ([place])    Diverticulitis    Diverticulosis of colon (without mention of hemorrhage)    DJD (degenerative joint disease)    knees/hands/feet/back/neck   Fatty liver    Full dentures    GERD (gastroesophageal reflux disease)    H/O hiatal hernia    History of blood transfusion    with a past surical procedure per patient 06/01/20   Hyperlipidemia    Hypertension    Neuromuscular disorder ([place])    peripheral neuropathy   Non-pressure chronic ulcer of other part of left foot limited to breakdown of skin ([place]) 11/12/2016   Osteomyelitis (HCC)    left 5th metatarsal   PAD (peripheral artery disease) ([place])    Distal aortogram June 2012. Atherectomy left popliteal artery July 2012.    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 Before age 59   Hyperlipidemia Brother    Hypertension Brother    Cancer Sister        Breast   Hyperlipidemia Sister    Hypertension Sister    Hypertension Son    Arthritis Other        GRANDMOTHER   Hypertension Other        OTHER FAMILY MEMBERS    Past Surgical History:  Procedure Laterality Date   AMPUTATION  11/05/2011   Procedure: AMPUTATION RAY;  Surgeon: Wylene Simmer, MD;  Location: [facility];  Service: Orthopedics;  Laterality: Right;  Amputation of Right 4&5th Toes   AMPUTATION Left 11/26/2012   Procedure: AMPUTATION RAY;  Surgeon: Wylene Simmer, MD;  Location: [facility];  Service: Orthopedics;  Laterality: Left;  fourth ray amputation   AMPUTATION Right 08/27/2014   Procedure: Transmetatarsal Amputation;  Surgeon: Newt Minion, MD;  Location: [facility];  Service: Orthopedics;  Laterality: Right;   AMPUTATION Right 01/14/2015   Procedure: AMPUTATION BELOW KNEE;  Surgeon: Newt Minion, MD;  Location: [facility];  Service: Orthopedics;  Laterality: Right;   AMPUTATION Left 10/21/2015   Procedure: Left Foot 5th Ray Amputation;  Surgeon: Newt Minion, MD;  Location: [facility];  Service: Orthopedics;  Laterality: Left;   ANTERIOR FUSION CERVICAL SPINE  02/06/06   C4-5, C5-6, C6-7; SURGEON DR. MAX COHEN   AV FISTULA PLACEMENT Left 06/02/2020   Procedure: ARTERIOVENOUS (AV) FISTULA CREATION LEFT;  Surgeon: Waynetta Sandy, MD;  Location: [facility];  Service: Vascular;  Laterality: Left;  BACK SURGERY     x 3   [place] TRANSPOSITION Left 07/21/2020   Procedure: LEFT UPPER ARM ATERIOVENOUS SUPERFISTULALIZATION;  Surgeon: Waynetta Sandy, MD;  Location: [facility];  Service: Vascular;  Laterality: Left;   BELOW KNEE LEG AMPUTATION Right    CARDIAC CATHETERIZATION  10/31/04   2009   CAROTID ENDARTERECTOMY  11/10/10   CAROTID ENDARTERECTOMY Left 11/10/2010   Subtotal occlusion of left internal carotid artery with left hemispheric transient ischemic attacks.   CAROTID STENT     CARPAL TUNNEL RELEASE Right 10/21/2013   Procedure: RIGHT CARPAL TUNNEL RELEASE;  Surgeon: Wynonia Sours, MD;  Location: [facility];  Service: Orthopedics;  Laterality: Right;   CHOLECYSTECTOMY     COLON SURGERY     COLONOSCOPY     COLOSTOMY REVERSAL  05/21/2018   ileostomy reversal   CYSTOSCOPY WITH STENT PLACEMENT Bilateral 01/13/2018   Procedure: CYSTOSCOPY WITH BILATERAL URETERAL CATHETER PLACEMENT;  Surgeon: Ardis Hughs, MD;  Location: [facility];  Service: Urology;  Laterality: Bilateral;   ESOPHAGEAL MANOMETRY Bilateral 07/19/2014   Procedure: ESOPHAGEAL MANOMETRY (EM);  Surgeon: Jerene Bears, MD;  Location[facility];  Service: Gastroenterology;  Laterality: Bilateral;   EYE SURGERY Bilateral 2020   cataract   FEMORAL ARTERY STENT     x6   FINGER SURGERY     FOOT SURGERY  04/25/2016    EXCISION BASE 5TH METATARSAL AND PARTIAL CUBOID LEFT FOOT   HERNIA REPAIR     LEFT INGUINAL AND UMBILICAL REPAIRS   HERNIA REPAIR     I & D EXTREMITY Left 04/25/2016   Procedure: EXCISION BASE 5TH METATARSAL AND PARTIAL CUBOID LEFT FOOT;  Surgeon: Newt Minion, MD;  Location: [facility];  Service: Orthopedics;  Laterality: Left;   ILEOSTOMY  01/13/2018   Procedure: ILEOSTOMY;  Surgeon: Clovis Riley, MD;  Location: [facility];  Service: General;;   ILEOSTOMY CLOSURE N/A 05/21/2018   Procedure: ILEOSTOMY REVERSAL ERAS PATHWAY;  Surgeon: Clovis Riley, MD;  Location: [facility];  Service: General;  Laterality: N/A;   IR RADIOLOGIST EVAL & MGMT  11/19/2017   IR RADIOLOGIST EVAL & MGMT  12/03/2017   IR RADIOLOGIST EVAL & MGMT  12/18/2017   JOINT  REPLACEMENT Right 2001   Total knee   LAMINECTOMY     X 3 LUMBAR AND X 2 CERVICAL SPINE OPERATIONS   LAPAROSCOPIC CHOLECYSTECTOMY W/ CHOLANGIOGRAPHY  11/09/04   SURGEON DR. [place] CATH AND CORONARY ANGIOGRAPHY N/A 09/16/2018   Procedure: LEFT HEART CATH AND CORONARY ANGIOGRAPHY;  Surgeon: Nigel Mormon, MD;  Location: [facility];  Service: Cardiovascular;  Laterality: N/A;   LEFT HEART CATHETERIZATION WITH CORONARY ANGIOGRAM N/A 10/29/2014   Procedure: LEFT HEART CATHETERIZATION WITH CORONARY ANGIOGRAM;  Surgeon: Laverda Page, MD;  Location: [facility] C[facility] LAB;  Service: Cardiovascular;  Laterality: N/A;   LIGATION OF COMPETING BRANCHES OF ARTERIOVENOUS FISTULA Left 07/21/2020   Procedure: LIGATION OF COMPETING BRANCHES OF LEFT UPPER ARM ARTERIOVENOUS FISTULA;  Surgeon: Waynetta Sandy, MD;  Location: [facility];  Service: Vascular;  Laterality: Left;   LOWER EXTREMITY ANGIOGRAM N/A 03/19/2012   Procedure: LOWER EXTREMITY ANGIOGRAM;  Surgeon: Burnell Blanks, MD;  Location: [facility] LAB;  Service: Cardiovascular;  Laterality: N/A;   NECK SURGERY     PARTIAL COLECTOMY N/A 01/13/2018   Procedure: LAPAROSCOPIC ASSISTED   SIGMOID COLECTOMY ILEOSTOMY;  Surgeon: Clovis Riley, MD;  Location:  [facility];  Service: General;  Laterality: N/A;   PENILE PROSTHESIS IMPLANT  08/14/05   INFRAPUBIC INSERTION OF INFLATABLE PENILE PROSTHESIS; SURGEON DR. Amalia Hailey   PENILE PROSTHESIS IMPLANT     PERCUTANEOUS CORONARY STENT INTERVENTION (PCI-S) Right 10/29/2014   Procedure: PERCUTANEOUS CORONARY STENT INTERVENTION (PCI-S);  Surgeon: Laverda Page, MD;  Location: [facility] LAB;  Service: Cardiovascular;  Laterality: Right;   SHOULDER ARTHROSCOPY     SPINE SURGERY     TOE AMPUTATION Left    TONSILLECTOMY     TOTAL KNEE ARTHROPLASTY  07/2002   RIGHT KNEE ; SURGEON  DR. [name] ALSO HAD ARTHROSCOPIC RIGHT KNEE IN  10/2001   TOTAL KNEE ARTHROPLASTY     ULNAR NERVE TRANSPOSITION  Right 10/21/2013   Procedure: RIGHT ELBOW  ULNAR NERVE DECOMPRESSION;  Surgeon: Wynonia Sours, MD;  Location: [facility];  Service: Orthopedics;  Laterality: Right;   Social History   Occupational History   Occupation: Magazine features editor: UNEMPLOYED  Tobacco Use   Smoking status: Former    Packs/day: 2.00    Years: 35.00    Pack years: 70.00    Types: Cigarettes    Quit date: 10/28/2011    Years since quitting: 9.6   Smokeless tobacco: Never  Vaping Use   Vaping Use: Some days   Substances: Nicotine  Substance and Sexual Activity   Alcohol use: Not Currently    Comment: "not in a long time"   Drug use: Never   Sexual activity: Yes    Birth control/protection: Implant    Comment: penile implant

## 2021-06-13 ENCOUNTER — Emergency Department (HOSPITAL_COMMUNITY): Payer: 59

## 2021-06-13 ENCOUNTER — Inpatient Hospital Stay (HOSPITAL_COMMUNITY)
Admission: EM | Admit: 2021-06-13 | Discharge: 2021-06-30 | DRG: 673 | Disposition: A | Discharge status: Home Health | Payer: 59 | Attending: Internal Medicine | Admitting: Internal Medicine

## 2021-06-13 ENCOUNTER — Encounter (HOSPITAL_COMMUNITY): Payer: Self-pay | Admitting: Emergency Medicine

## 2021-06-13 DIAGNOSIS — I4819 Other persistent atrial fibrillation: Secondary | ICD-10-CM | POA: Diagnosis present

## 2021-06-13 DIAGNOSIS — E872 Acidosis, unspecified: Secondary | ICD-10-CM

## 2021-06-13 DIAGNOSIS — Z8249 Family history of ischemic heart disease and other diseases of the circulatory system: Secondary | ICD-10-CM

## 2021-06-13 DIAGNOSIS — E785 Hyperlipidemia, unspecified: Secondary | ICD-10-CM | POA: Diagnosis present

## 2021-06-13 DIAGNOSIS — M1990 Unspecified osteoarthritis, unspecified site: Secondary | ICD-10-CM | POA: Diagnosis present

## 2021-06-13 DIAGNOSIS — I132 Hypertensive heart and chronic kidney disease with heart failure and with stage 5 chronic kidney disease, or end stage renal disease: Secondary | ICD-10-CM | POA: Diagnosis present

## 2021-06-13 DIAGNOSIS — A480 Gas gangrene: Secondary | ICD-10-CM | POA: Diagnosis present

## 2021-06-13 DIAGNOSIS — Z87891 Personal history of nicotine dependence: Secondary | ICD-10-CM

## 2021-06-13 DIAGNOSIS — I739 Peripheral vascular disease, unspecified: Secondary | ICD-10-CM

## 2021-06-13 DIAGNOSIS — Z6832 Body mass index (BMI) 32.0-32.9, adult: Secondary | ICD-10-CM

## 2021-06-13 DIAGNOSIS — Z83438 Family history of other disorder of lipoprotein metabolism and other lipidemia: Secondary | ICD-10-CM

## 2021-06-13 DIAGNOSIS — Z833 Family history of diabetes mellitus: Secondary | ICD-10-CM

## 2021-06-13 DIAGNOSIS — Z992 Dependence on renal dialysis: Secondary | ICD-10-CM

## 2021-06-13 DIAGNOSIS — M898X9 Other specified disorders of bone, unspecified site: Secondary | ICD-10-CM | POA: Diagnosis present

## 2021-06-13 DIAGNOSIS — A419 Sepsis, unspecified organism: Principal | ICD-10-CM

## 2021-06-13 DIAGNOSIS — N2581 Secondary hyperparathyroidism of renal origin: Secondary | ICD-10-CM | POA: Diagnosis present

## 2021-06-13 DIAGNOSIS — L03116 Cellulitis of left lower limb: Secondary | ICD-10-CM | POA: Diagnosis present

## 2021-06-13 DIAGNOSIS — J449 Chronic obstructive pulmonary disease, unspecified: Secondary | ICD-10-CM | POA: Diagnosis present

## 2021-06-13 DIAGNOSIS — E669 Obesity, unspecified: Secondary | ICD-10-CM | POA: Diagnosis present

## 2021-06-13 DIAGNOSIS — L97929 Non-pressure chronic ulcer of unspecified part of left lower leg with unspecified severity: Secondary | ICD-10-CM | POA: Diagnosis present

## 2021-06-13 DIAGNOSIS — Z89511 Acquired absence of right leg below knee: Secondary | ICD-10-CM

## 2021-06-13 DIAGNOSIS — M8580 Other specified disorders of bone density and structure, unspecified site: Secondary | ICD-10-CM | POA: Diagnosis present

## 2021-06-13 DIAGNOSIS — Z955 Presence of coronary angioplasty implant and graft: Secondary | ICD-10-CM

## 2021-06-13 DIAGNOSIS — Z20822 Contact with and (suspected) exposure to covid-19: Secondary | ICD-10-CM | POA: Diagnosis present

## 2021-06-13 DIAGNOSIS — T8361XA Infection and inflammatory reaction due to implanted penile prosthesis, initial encounter: Principal | ICD-10-CM | POA: Diagnosis present

## 2021-06-13 DIAGNOSIS — M549 Dorsalgia, unspecified: Secondary | ICD-10-CM | POA: Diagnosis present

## 2021-06-13 DIAGNOSIS — M869 Osteomyelitis, unspecified: Secondary | ICD-10-CM | POA: Diagnosis present

## 2021-06-13 DIAGNOSIS — Y738 Miscellaneous gastroenterology and urology devices associated with adverse incidents, not elsewhere classified: Secondary | ICD-10-CM | POA: Diagnosis not present

## 2021-06-13 DIAGNOSIS — E1122 Type 2 diabetes mellitus with diabetic chronic kidney disease: Secondary | ICD-10-CM | POA: Diagnosis present

## 2021-06-13 DIAGNOSIS — E119 Type 2 diabetes mellitus without complications: Secondary | ICD-10-CM

## 2021-06-13 DIAGNOSIS — D631 Anemia in chronic kidney disease: Secondary | ICD-10-CM | POA: Diagnosis present

## 2021-06-13 DIAGNOSIS — Y831 Surgical operation with implant of artificial internal device as the cause of abnormal reaction of the patient, or of later complication, without mention of misadventure at the time of the procedure: Secondary | ICD-10-CM | POA: Diagnosis present

## 2021-06-13 DIAGNOSIS — R6520 Severe sepsis without septic shock: Secondary | ICD-10-CM | POA: Diagnosis present

## 2021-06-13 DIAGNOSIS — Z96651 Presence of right artificial knee joint: Secondary | ICD-10-CM | POA: Diagnosis present

## 2021-06-13 DIAGNOSIS — Z91041 Radiographic dye allergy status: Secondary | ICD-10-CM

## 2021-06-13 DIAGNOSIS — I5032 Chronic diastolic (congestive) heart failure: Secondary | ICD-10-CM | POA: Diagnosis present

## 2021-06-13 DIAGNOSIS — E876 Hypokalemia: Secondary | ICD-10-CM | POA: Diagnosis not present

## 2021-06-13 DIAGNOSIS — Z7901 Long term (current) use of anticoagulants: Secondary | ICD-10-CM

## 2021-06-13 DIAGNOSIS — G894 Chronic pain syndrome: Secondary | ICD-10-CM | POA: Diagnosis present

## 2021-06-13 DIAGNOSIS — N4822 Cellulitis of corpus cavernosum and penis: Secondary | ICD-10-CM | POA: Diagnosis present

## 2021-06-13 DIAGNOSIS — E1142 Type 2 diabetes mellitus with diabetic polyneuropathy: Secondary | ICD-10-CM | POA: Diagnosis present

## 2021-06-13 DIAGNOSIS — N186 End stage renal disease: Secondary | ICD-10-CM | POA: Diagnosis present

## 2021-06-13 DIAGNOSIS — Z981 Arthrodesis status: Secondary | ICD-10-CM

## 2021-06-13 DIAGNOSIS — K219 Gastro-esophageal reflux disease without esophagitis: Secondary | ICD-10-CM | POA: Diagnosis present

## 2021-06-13 DIAGNOSIS — G4733 Obstructive sleep apnea (adult) (pediatric): Secondary | ICD-10-CM | POA: Diagnosis present

## 2021-06-13 DIAGNOSIS — I87332 Chronic venous hypertension (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: Secondary | ICD-10-CM | POA: Diagnosis present

## 2021-06-13 DIAGNOSIS — T83031A Leakage of indwelling urethral catheter, initial encounter: Secondary | ICD-10-CM | POA: Diagnosis not present

## 2021-06-13 DIAGNOSIS — Z888 Allergy status to other drugs, medicaments and biological substances status: Secondary | ICD-10-CM

## 2021-06-13 DIAGNOSIS — Z800 Family history of malignant neoplasm of digestive organs: Secondary | ICD-10-CM

## 2021-06-13 DIAGNOSIS — Z79899 Other long term (current) drug therapy: Secondary | ICD-10-CM

## 2021-06-13 DIAGNOSIS — Z794 Long term (current) use of insulin: Secondary | ICD-10-CM

## 2021-06-13 DIAGNOSIS — L97521 Non-pressure chronic ulcer of other part of left foot limited to breakdown of skin: Secondary | ICD-10-CM | POA: Diagnosis present

## 2021-06-13 DIAGNOSIS — E11621 Type 2 diabetes mellitus with foot ulcer: Secondary | ICD-10-CM | POA: Diagnosis present

## 2021-06-13 DIAGNOSIS — E1152 Type 2 diabetes mellitus with diabetic peripheral angiopathy with gangrene: Secondary | ICD-10-CM | POA: Diagnosis present

## 2021-06-13 DIAGNOSIS — K760 Fatty (change of) liver, not elsewhere classified: Secondary | ICD-10-CM | POA: Diagnosis present

## 2021-06-13 DIAGNOSIS — L89323 Pressure ulcer of left buttock, stage 3: Secondary | ICD-10-CM | POA: Clinically undetermined

## 2021-06-13 DIAGNOSIS — E11649 Type 2 diabetes mellitus with hypoglycemia without coma: Secondary | ICD-10-CM | POA: Diagnosis not present

## 2021-06-13 DIAGNOSIS — N390 Urinary tract infection, site not specified: Secondary | ICD-10-CM | POA: Diagnosis present

## 2021-06-13 DIAGNOSIS — A4181 Sepsis due to Enterococcus: Secondary | ICD-10-CM | POA: Diagnosis present

## 2021-06-13 DIAGNOSIS — E871 Hypo-osmolality and hyponatremia: Secondary | ICD-10-CM | POA: Diagnosis not present

## 2021-06-13 DIAGNOSIS — Z23 Encounter for immunization: Secondary | ICD-10-CM

## 2021-06-13 DIAGNOSIS — L89313 Pressure ulcer of right buttock, stage 3: Secondary | ICD-10-CM | POA: Clinically undetermined

## 2021-06-13 DIAGNOSIS — E1169 Type 2 diabetes mellitus with other specified complication: Secondary | ICD-10-CM | POA: Diagnosis present

## 2021-06-13 DIAGNOSIS — E1165 Type 2 diabetes mellitus with hyperglycemia: Secondary | ICD-10-CM | POA: Diagnosis not present

## 2021-06-13 DIAGNOSIS — I2510 Atherosclerotic heart disease of native coronary artery without angina pectoris: Secondary | ICD-10-CM | POA: Diagnosis present

## 2021-06-13 DIAGNOSIS — I480 Paroxysmal atrial fibrillation: Secondary | ICD-10-CM | POA: Diagnosis present

## 2021-06-13 LAB — CBC WITH DIFFERENTIAL/PLATELET
Abs Immature Granulocytes: 0.18 10*3/uL — ABNORMAL HIGH (ref 0.00–0.07)
Basophils Absolute: 0 10*3/uL (ref 0.0–0.1)
Basophils Relative: 0 %
Eosinophils Absolute: 0 10*3/uL (ref 0.0–0.5)
Eosinophils Relative: 0 %
HCT: 36.8 % — ABNORMAL LOW (ref 39.0–52.0)
Hemoglobin: 12.1 g/dL — ABNORMAL LOW (ref 13.0–17.0)
Immature Granulocytes: 1 %
Lymphocytes Relative: 3 %
Lymphs Abs: 0.7 10*3/uL (ref 0.7–4.0)
MCH: 30 pg (ref 26.0–34.0)
MCHC: 32.9 g/dL (ref 30.0–36.0)
MCV: 91.3 fL (ref 80.0–100.0)
Monocytes Absolute: 1.3 10*3/uL — ABNORMAL HIGH (ref 0.1–1.0)
Monocytes Relative: 6 %
Neutro Abs: 19.5 10*3/uL — ABNORMAL HIGH (ref 1.7–7.7)
Neutrophils Relative %: 90 %
Platelets: 358 10*3/uL (ref 150–400)
RBC: 4.03 MIL/uL — ABNORMAL LOW (ref 4.22–5.81)
RDW: 13.2 % (ref 11.5–15.5)
WBC: 21.7 10*3/uL — ABNORMAL HIGH (ref 4.0–10.5)
nRBC: 0 % (ref 0.0–0.2)

## 2021-06-13 LAB — COMPREHENSIVE METABOLIC PANEL
ALT: 22 U/L (ref 0–44)
AST: 36 U/L (ref 15–41)
Albumin: 2.7 g/dL — ABNORMAL LOW (ref 3.5–5.0)
Alkaline Phosphatase: 123 U/L (ref 38–126)
Anion gap: 19 — ABNORMAL HIGH (ref 5–15)
BUN: 18 mg/dL (ref 8–23)
CO2: 26 mmol/L (ref 22–32)
Calcium: 8.7 mg/dL — ABNORMAL LOW (ref 8.9–10.3)
Chloride: 91 mmol/L — ABNORMAL LOW (ref 98–111)
Creatinine, Ser: 4.23 mg/dL — ABNORMAL HIGH (ref 0.61–1.24)
GFR, Estimated: 15 mL/min — ABNORMAL LOW (ref >60)
Glucose, Bld: 336 mg/dL — ABNORMAL HIGH (ref 70–99)
Potassium: 4.1 mmol/L (ref 3.5–5.1)
Sodium: 136 mmol/L (ref 135–145)
Total Bilirubin: 1.1 mg/dL (ref 0.3–1.2)
Total Protein: 6.8 g/dL (ref 6.5–8.1)

## 2021-06-13 LAB — APTT: aPTT: 36 seconds (ref 24–36)

## 2021-06-13 LAB — RESP PANEL BY RT-PCR (FLU A&B, COVID) ARPGX2
Influenza A by PCR: NEGATIVE
Influenza B by PCR: NEGATIVE
SARS Coronavirus 2 by RT PCR: NEGATIVE

## 2021-06-13 LAB — PROTIME-INR
INR: 1.5 — ABNORMAL HIGH (ref 0.8–1.2)
Prothrombin Time: 18 seconds — ABNORMAL HIGH (ref 11.4–15.2)

## 2021-06-13 LAB — LACTIC ACID, PLASMA
Lactic Acid, Venous: 3.5 mmol/L (ref 0.5–1.9)
Lactic Acid, Venous: 3.9 mmol/L (ref 0.5–1.9)

## 2021-06-13 MED ORDER — LACTATED RINGERS IV BOLUS (SEPSIS)
1000.0000 mL | Freq: Once | INTRAVENOUS | Status: AC
  Administered 2021-06-13: 1000 mL via INTRAVENOUS

## 2021-06-13 MED ORDER — LACTATED RINGERS IV BOLUS (SEPSIS)
500.0000 mL | Freq: Once | INTRAVENOUS | Status: AC
  Administered 2021-06-13: 500 mL via INTRAVENOUS

## 2021-06-13 MED ORDER — METRONIDAZOLE 500 MG/100ML IV SOLN
500.0000 mg | Freq: Once | INTRAVENOUS | Status: AC
  Administered 2021-06-13: 500 mg via INTRAVENOUS
  Filled 2021-06-13: qty 100

## 2021-06-13 MED ORDER — SODIUM CHLORIDE 0.9 % IV SOLN
2.0000 g | Freq: Once | INTRAVENOUS | Status: AC
  Administered 2021-06-13: 2 g via INTRAVENOUS
  Filled 2021-06-13: qty 2

## 2021-06-13 MED ORDER — VANCOMYCIN HCL 2000 MG/400ML IV SOLN
2000.0000 mg | Freq: Once | INTRAVENOUS | Status: AC
  Administered 2021-06-13: 2000 mg via INTRAVENOUS
  Filled 2021-06-13 (×2): qty 400

## 2021-06-13 MED ORDER — ACETAMINOPHEN 325 MG PO TABS
650.0000 mg | ORAL_TABLET | Freq: Four times a day (QID) | ORAL | Status: DC | PRN
  Administered 2021-06-13: 650 mg via ORAL
  Filled 2021-06-13: qty 2

## 2021-06-13 MED ORDER — LACTATED RINGERS IV SOLN: INTRAVENOUS | Status: DC

## 2021-06-13 MED ORDER — VANCOMYCIN VARIABLE DOSE PER UNSTABLE RENAL FUNCTION (PHARMACIST DOSING): Status: DC

## 2021-06-13 NOTE — ED Provider Notes (Addendum)
[facility] EMERGENCY DEPARTMENT Provider Note   CSN: [id number] Arrival date & time: 06/13/21  1836     History Chief Complaint  Patient presents with   Wound Infection    [name] is a 65 y.o. male with past medical history of right BKA, left fourth and fifth lower digit amputations, CKD on HD, diabetes, hypertension, atrial fibrillation on Eliquis 5 mg daily who presents after completing a full hemodialysis session where he was noted to have confusion and feeling "out of it ", causing concerns that he may have worsening infection of right foot ulcer.  He says that he feels terrible at this time, states that at his HD session he felt like he could not control himself and felt very out of it.  He has noted fevers and chills for a long time now.  He denies significant pain to the area.  He was initiated on Bactrim by [name] yesterday and states that he has had 3 doses to this point.  Of note, patient does have 2+ pitting edema to left lower extremity with increased warmth.  Skin is extremely dry and flaky, with significant indentions from Ace bandage wrap to the ankle.  He states that he does not do wound care daily at home, that it is done when he follows up with [name].  The last time the wound dressing was changed was 3 or 4 days ago.  He does also raise concerns that he feels his penile implant is starting to erode through his penis.  He has had worsening pain, erythema over the last several weeks.  According to his wife who was on the phone, they did try to get an appointment with his urologist however there were no openings and he was advised to report to the emergency room if it did erode through.  Patient states he was diagnosed with atrial fibrillation a few months ago and was started on Eliquis 5 mg daily at that time.  He has not taken his dose for today.    Past Medical History:  Diagnosis Date   Carotid artery occlusion 11/10/10   LEFT CAROTID  ENDARTERECTOMY   Chronic kidney disease    Complication of anesthesia    BP WENT UP AT [facility] "   COPD (chronic obstructive pulmonary disease) ([place])    pt denies this dx as of 06/01/20 - no inhaler    Diabetes mellitus without complication ([place])    Diverticulitis    Diverticulosis of colon (without mention of hemorrhage)    DJD (degenerative joint disease)    knees/hands/feet/back/neck   Fatty liver    Full dentures    GERD (gastroesophageal reflux disease)    H/O hiatal hernia    History of blood transfusion    with a past surical procedure per patient 06/01/20   Hyperlipidemia    Hypertension    Neuromuscular disorder ([place])    peripheral neuropathy   Non-pressure chronic ulcer of other part of left foot limited to breakdown of skin ([place]) 11/12/2016   Osteomyelitis (HCC)    left 5th metatarsal   PAD (peripheral artery disease) ([place])    Distal aortogram June 2012. Atherectomy left popliteal artery July 2012.    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   Unstable angina ([place]) 09/16/2018   Wears glasses  Patient Active Problem List   Diagnosis Date Noted   Carotid stenosis 06/15/2020   Dyslipidemia 06/15/2020   Severe nonproliferative diabetic retinopathy of right eye, with macular edema, associated with type 2 diabetes mellitus ([place]) 01/18/2020   Severe nonproliferative diabetic retinopathy of left eye, with macular edema, associated with type 2 diabetes mellitus ([place]) 01/04/2020   Retinal hemorrhage of right eye 01/04/2020   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   Pseudoclaudication 11/15/2018   Essential hypertension 09/18/2018   S/P BKA (below knee amputation), right ([place]) 09/18/2018   Amputation of toe of left foot ([place]) 09/18/2018   Peripheral artery disease ([place]) 09/18/2018   S/P carotid endarterectomy 09/18/2018   OSA on CPAP 09/18/2018   Chronic back pain  09/18/2018   Status post reversal of ileostomy 05/21/2018   Normocytic anemia 02/14/2018   Intra-abdominal abscess ([place]) 11/04/2017   Chronic venous hypertension (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 ([place]) 12/11/2016   Unilateral primary osteoarthritis, left knee 10/11/2016   History of right below knee amputation ([place]) 07/12/2016   Diabetic foot infection ([place]) 05/04/2016   Type 2 diabetes mellitus with insulin therapy ([place]) 12/16/2015   Amputated toe ([place]) 10/21/2015   Leg edema, left 09/03/2015   CAD (dz of distal, mid and proximal RCA with implantation of 3 overlapping drug-eluting stent,) 09/03/2015   Chronic diastolic heart failure, NYHA class 2 ([place]) 09/03/2015   Angina pectoris associated with type 2 diabetes mellitus ([place]) 10/28/2014   Hyperlipidemia 08/25/2014   Limb pain 03/20/2013   DJD (degenerative joint disease) 09/25/2012   Migraine 09/25/2012   Neuropathy 09/25/2012   Restless legs syndrome (RLS) 09/25/2012   Chronic obstructive pulmonary disease, unspecified ([place]) 04/25/2012   Unknown cause of morbidity or mortality 04/25/2012   Chronic total occlusion of artery of the extremities ([place]) 04/08/2012   Onychomycosis 02/01/2012   Occlusion and stenosis of carotid artery without mention of cerebral infarction 06/12/2011   GERD (gastroesophageal reflux disease) 05/08/2011   Barrett's esophagus without dysplasia 05/08/2011   Former tobacco use 02/01/2011    Past Surgical History:  Procedure Laterality Date   AMPUTATION  11/05/2011   Procedure: AMPUTATION RAY;  Surgeon: Wylene Simmer, MD;  Location: [facility];  Service: Orthopedics;  Laterality: Right;  Amputation of Right 4&5th Toes   AMPUTATION Left 11/26/2012   Procedure: AMPUTATION RAY;  Surgeon: Wylene Simmer, MD;  Location: [facility];  Service: Orthopedics;  Laterality: Left;  fourth ray amputation   AMPUTATION Right 08/27/2014   Procedure: Transmetatarsal Amputation;  Surgeon: Newt Minion, MD;   Location: [facility];  Service: Orthopedics;  Laterality: Right;   AMPUTATION Right 01/14/2015   Procedure: AMPUTATION BELOW KNEE;  Surgeon: Newt Minion, MD;  Location: [facility];  Service: Orthopedics;  Laterality: Right;   AMPUTATION Left 10/21/2015   Procedure: Left Foot 5th Ray Amputation;  Surgeon: Newt Minion, MD;  Location: [facility];  Service: Orthopedics;  Laterality: Left;   ANTERIOR FUSION CERVICAL SPINE  02/06/06   C4-5, C5-6, C6-7; SURGEON DR. MAX COHEN   AV FISTULA PLACEMENT Left 06/02/2020   Procedure: ARTERIOVENOUS (AV) FISTULA CREATION LEFT;  Surgeon: Waynetta Sandy, MD;  Location: [facility];  Service: Vascular;  Laterality: Left;   BACK SURGERY     x 3   [place] Left 07/21/2020   Procedure: LEFT UPPER ARM ATERIOVENOUS SUPERFISTULALIZATION;  Surgeon: Waynetta Sandy, MD;  Location: [facility];  Service: Vascular;  Laterality: Left;   BELOW KNEE LEG AMPUTATION Right  CARDIAC CATHETERIZATION  10/31/04   2009   CAROTID ENDARTERECTOMY  11/10/10   CAROTID ENDARTERECTOMY Left 11/10/2010   Subtotal occlusion of left internal carotid artery with left hemispheric transient ischemic attacks.   CAROTID STENT     CARPAL TUNNEL RELEASE Right 10/21/2013   Procedure: RIGHT CARPAL TUNNEL RELEASE;  Surgeon: Wynonia Sours, MD;  Location: [facility];  Service: Orthopedics;  Laterality: Right;   CHOLECYSTECTOMY     COLON SURGERY     COLONOSCOPY     COLOSTOMY REVERSAL  05/21/2018   ileostomy reversal   CYSTOSCOPY WITH STENT PLACEMENT Bilateral 01/13/2018   Procedure: CYSTOSCOPY WITH BILATERAL URETERAL CATHETER PLACEMENT;  Surgeon: Ardis Hughs, MD;  Location: [facility];  Service: Urology;  Laterality: Bilateral;   ESOPHAGEAL MANOMETRY Bilateral 07/19/2014   Procedure: ESOPHAGEAL MANOMETRY (EM);  Surgeon: Jerene Bears, MD;  [facility];  Service: Gastroenterology;  Laterality: Bilateral;   EYE SURGERY Bilateral 2020   cataract   FEMORAL ARTERY STENT      x6   FINGER SURGERY     FOOT SURGERY  04/25/2016    EXCISION BASE 5TH METATARSAL AND PARTIAL CUBOID LEFT FOOT   HERNIA REPAIR     LEFT INGUINAL AND UMBILICAL REPAIRS   HERNIA REPAIR     I & D EXTREMITY Left 04/25/2016   Procedure: EXCISION BASE 5TH METATARSAL AND PARTIAL CUBOID LEFT FOOT;  Surgeon: Newt Minion, MD;  Location: [facility];  Service: Orthopedics;  Laterality: Left;   ILEOSTOMY  01/13/2018   Procedure: ILEOSTOMY;  Surgeon: Clovis Riley, MD;  Location[facility];  Service: General;;   ILEOSTOMY CLOSURE N/A 05/21/2018   Procedure: ILEOSTOMY REVERSAL ERAS PATHWAY;  Surgeon: Clovis Riley, MD;  Location: [facility];  Service: General;  Laterality: N/A;   IR RADIOLOGIST EVAL & MGMT  11/19/2017   IR RADIOLOGIST EVAL & MGMT  12/03/2017   IR RADIOLOGIST EVAL & MGMT  12/18/2017   JOINT REPLACEMENT Right 2001   Total knee   LAMINECTOMY     X 3 LUMBAR AND X 2 CERVICAL SPINE OPERATIONS   LAPAROSCOPIC CHOLECYSTECTOMY W/ CHOLANGIOGRAPHY  11/09/04   SURGEON DR. Tripoli CATH AND CORONARY ANGIOGRAPHY N/A 09/16/2018   Procedure: LEFT HEART CATH AND CORONARY ANGIOGRAPHY;  Surgeon: Nigel Mormon, MD;  Location: [facility];  Service: Cardiovascular;  Laterality: N/A;   LEFT HEART CATHETERIZATION WITH CORONARY ANGIOGRAM N/A 10/29/2014   Procedure: LEFT HEART CATHETERIZATION WITH CORONARY ANGIOGRAM;  Surgeon: Laverda Page, MD;  Location: [facility] CATH LAB;  Service: Cardiovascular;  Laterality: N/A;   LIGATION OF COMPETING BRANCHES OF ARTERIOVENOUS FISTULA Left 07/21/2020   Procedure: LIGATION OF COMPETING BRANCHES OF LEFT UPPER ARM ARTERIOVENOUS FISTULA;  Surgeon: Waynetta Sandy, MD;  Location: [facility];  Service: Vascular;  Laterality: Left;   LOWER EXTREMITY ANGIOGRAM N/A 03/19/2012   Procedure: LOWER EXTREMITY ANGIOGRAM;  Surgeon: Burnell Blanks, MD;  Location: [facility] CATH LAB;  Service: Cardiovascular;  Laterality: N/A;   NECK SURGERY     PARTIAL COLECTOMY  N/A 01/13/2018   Procedure: LAPAROSCOPIC ASSISTED   SIGMOID COLECTOMY ILEOSTOMY;  Surgeon: Clovis Riley, MD;  [facility];  Service: General;  Laterality: N/A;   PENILE PROSTHESIS IMPLANT  08/14/05   INFRAPUBIC INSERTION OF INFLATABLE PENILE PROSTHESIS; SURGEON DR. Amalia Hailey   PENILE PROSTHESIS IMPLANT     PERCUTANEOUS CORONARY STENT INTERVENTION (PCI-S) Right 10/29/2014   Procedure: PERCUTANEOUS CORONARY STENT INTERVENTION (  PCI-S);  Surgeon: Laverda Page, MD;  Location: [facility] CATH LAB;  Service: Cardiovascular;  Laterality: Right;   SHOULDER ARTHROSCOPY     SPINE SURGERY     TOE AMPUTATION Left    TONSILLECTOMY     TOTAL KNEE ARTHROPLASTY  07/2002   RIGHT KNEE ; SURGEON  DR. [name] ALSO HAD ARTHROSCOPIC RIGHT KNEE IN  10/2001   TOTAL KNEE ARTHROPLASTY     ULNAR NERVE TRANSPOSITION Right 10/21/2013   Procedure: RIGHT ELBOW  ULNAR NERVE DECOMPRESSION;  Surgeon: Wynonia Sours, MD;  Location: [facility];  Service: Orthopedics;  Laterality: Right;       Family History  Problem Relation Age of Onset   Heart disease Father        Before age 60-  CAD, BPG   Diabetes Father        Amputation   Cancer Father        PROSTATE   Hyperlipidemia Father    Hypertension Father    Heart attack Father        Triple BPG   Varicose Veins Father    Colon cancer Brother    Diabetes Brother    Heart disease Brother 11       A-Fib. Before age 59   Hyperlipidemia Brother    Hypertension Brother    Cancer Sister        Breast   Hyperlipidemia Sister    Hypertension Sister    Hypertension Son    Arthritis Other        GRANDMOTHER   Hypertension Other        OTHER FAMILY MEMBERS    Social History   Tobacco Use   Smoking status: Former    Packs/day: 2.00    Years: 35.00    Pack years: 70.00    Types: Cigarettes    Quit date: 10/28/2011    Years since quitting: 9.6   Smokeless tobacco: Never  Vaping Use   Vaping Use: Some days   Substances: Nicotine  Substance Use  Topics   Alcohol use: Not Currently    Comment: "not in a long time"   Drug use: Never    Home Medications Prior to Admission medications   Medication Sig Start Date End Date Taking? Authorizing Provider  amLODipine (NORVASC) 5 MG tablet Take 5 mg by mouth daily. 01/30/21   [provider]  apixaban (ELIQUIS) 5 MG TABS tablet Take 5 mg by mouth 2 (two) times daily.    [provider]  atorvastatin (LIPITOR) 40 MG tablet Take 40 mg by mouth at bedtime.     [provider]  BIDIL 20-37.5 MG tablet Take 1 tablet by mouth 3 (three) times daily. Do not take on dialysis days (Tuesday, Thursday, Saturday)  04/28/20   [provider]  carvedilol (COREG) 25 MG tablet Take 1.5 tablets (37.5 mg total) by mouth 2 (two) times daily. Patient taking differently: Take 25 mg by mouth 2 (two) times daily. 1 AND A HALF TAB BID 08/01/16   Adrian Prows, MD  famotidine (PEPCID) 40 MG tablet Take 40 mg by mouth daily. 04/29/21   [provider]  fentaNYL (DURAGESIC - DOSED MCG/HR) 50 MCG/HR Place 50 mcg onto the skin every 3 (three) days.    [provider]  Glucosamine HCl (GLUCOSAMINE PO) Take 100 tablets by mouth 2 (two) times daily.    [provider]  HUMALOG KWIKPEN 100 UNIT/ML KiwkPen Inject 15-16 Units into the skin See  admin instructions. Inject 13 units before breakfast, lunch, & dinner-- 11/14/17   [provider]  insulin glargine (LANTUS) 100 UNIT/ML injection Inject 55 Units into the skin at bedtime.    [provider]  ketoconazole (NIZORAL) 2 % cream Apply 1 application topically 2 (two) times daily as needed for irritation.  03/07/18   [provider]  linaclotide (LINZESS) 145 MCG CAPS capsule Take 145 mcg by mouth daily as needed (constipation).     [provider]  loratadine (CLARITIN) 10 MG tablet Take 10 mg by mouth daily.    [provider]  NEEDLE, REUSABLE, 22 G 22G X 1-1/2" MISC 1 Units by  Does not apply route as directed. For B12 IM inj 02/15/18   Elgergawy, Silver Huguenin, MD  nitroGLYCERIN (NITRODUR - DOSED IN MG/24 HR) 0.2 mg/hr patch Place 1 patch (0.2 mg total) onto the skin daily. 06/12/21   Newt Minion, MD  NOVOLOG FLEXPEN 100 UNIT/ML FlexPen Inject 13 Units into the skin daily. 04/29/21   [provider]  omega-3 acid ethyl esters (LOVAZA) 1 g capsule Take 2 g by mouth 2 (two) times daily.    [provider]  ondansetron (ZOFRAN) 4 MG tablet Take 4 mg by mouth every 6 (six) hours as needed for nausea or vomiting.    [provider]  oxyCODONE-acetaminophen (PERCOCET) 7.5-325 MG tablet Take 1 tablet by mouth 3 (three) times daily as needed. 12/07/20   [provider]  pantoprazole (PROTONIX) 40 MG tablet Take 40 mg by mouth daily.    [provider]  Polyvinyl Alcohol-Povidone PF 1.4-0.6 % SOLN Place 1 drop into both eyes 2 (two) times daily as needed (dry eyes).     [provider]  pregabalin (LYRICA) 100 MG capsule Take 1 capsule (100 mg total) by mouth 3 (three) times daily. 05/10/16   Orson Eva, MD  rOPINIRole (REQUIP) 2 MG tablet Take 2 mg by mouth at bedtime.    [provider]  sulfamethoxazole-trimethoprim (BACTRIM DS) 800-160 MG tablet Take 1 tablet by mouth 2 (two) times daily. 06/12/21   Newt Minion, MD  Syringe/Needle, Disp, (SYRINGE 3CC/22GX1-1/2") 22G X 1-1/2" 3 ML MISC 1 Syringe by Does not apply route as directed. For b12 IM inj 02/15/18   Elgergawy, Silver Huguenin, MD  tamsulosin (FLOMAX) 0.4 MG CAPS capsule Take 0.4 mg by mouth daily.    [provider]  torsemide (DEMADEX) 20 MG tablet Take 20 mg by mouth in the morning and at bedtime. Take on extra for swelling    [provider]    Allergies    Contrast media [iodinated diagnostic agents], Ivp dye [iodinated diagnostic agents], and Adhesive [tape]  Review of Systems   Review of Systems  Constitutional:  Positive for chills, fatigue and  fever.  Respiratory:  Negative for chest tightness and shortness of breath.   Cardiovascular:  Positive for leg swelling. Negative for chest pain.  Genitourinary:  Positive for penile pain, penile swelling and scrotal swelling.  Skin:  Positive for wound.   Physical Exam Updated Vital Signs BP 125/81   Pulse (!) 152   Temp (!) 102 F (38.9 C) (Oral)   Resp 20   Ht 6\' 2"  (1.88 m)   Wt 108.4 kg   SpO2 97%   BMI 30.69 kg/m  Constitutional: Ill-appearing gentleman lying in bed.  No acute distress noted. Cardio: Tachycardic with irregularly irregular rhythm. Pulm: Clear to auscultation bilaterally.  Normal work of breathing  on room air. GU: Erythema to penis and scrotal area with increased warmth and tenderness to palpation. MSK: S/p right BKA.  Left lower extremity s/p fourth and fifth digit amputation, with 2+ pitting edema, foot wound with odor to lateral plantar aspect of the foot surrounded by pink tissue.  There is also a wound over the heel.  Skin between the toes is moist, some crusting with darkened skin noted.  He does not note any tenderness when examining the wounds. Skin: Skin is warm and dry.  Skin over the left lower extremity is particularly flaky. Neuro: Alert and oriented x3.  No focal deficit noted. Psych: Patient is tearful on exam.   ED Results / Procedures / Treatments   Labs (all labs ordered are listed, but only abnormal results are displayed) Labs Reviewed  LACTIC ACID, PLASMA - Abnormal; Notable for the following components:      Result Value   Lactic Acid, Venous 3.5 (*)    All other components within normal limits  LACTIC ACID, PLASMA - Abnormal; Notable for the following components:   Lactic Acid, Venous 3.9 (*)    All other components within normal limits  COMPREHENSIVE METABOLIC PANEL - Abnormal; Notable for the following components:   Chloride 91 (*)    Glucose, Bld 336 (*)    Creatinine, Ser 4.23 (*)    Calcium 8.7 (*)    Albumin 2.7 (*)     GFR, Estimated 15 (*)    Anion gap 19 (*)    All other components within normal limits  CBC WITH DIFFERENTIAL/PLATELET - Abnormal; Notable for the following components:   WBC 21.7 (*)    RBC 4.03 (*)    Hemoglobin 12.1 (*)    HCT 36.8 (*)    Neutro Abs 19.5 (*)    Monocytes Absolute 1.3 (*)    Abs Immature Granulocytes 0.18 (*)    All other components within normal limits  PROTIME-INR - Abnormal; Notable for the following components:   Prothrombin Time 18.0 (*)    INR 1.5 (*)    All other components within normal limits  RESP PANEL BY RT-PCR (FLU A&B, COVID) ARPGX2  CULTURE, BLOOD (ROUTINE X 2)  CULTURE, BLOOD (ROUTINE X 2)  URINE CULTURE  APTT  URINALYSIS, ROUTINE W REFLEX MICROSCOPIC    EKG EKG Interpretation  Date/Time:  Tuesday June 13 2021 19:10:45 EDT Ventricular Rate:  154 PR Interval:    QRS Duration: 124 QT Interval:  334 QTC Calculation: 534 R Axis:   -73 Text Interpretation: Atrial fibrillation with rapid ventricular response Right bundle branch block Left anterior fascicular block Bifascicular block Septal infarct , age undetermined Abnormal ECG When compared to prior, new a fib with RVR. No STEMI Confirmed by Antony Blackbird [phone]) on 06/13/2021 7:22:44 PM  Radiology CT ABDOMEN PELVIS WO CONTRAST  Result Date: 06/13/2021 CLINICAL DATA:  Inflammatory change in the perineum and scrotum with elevated white blood cell count concerning for possible Fournier's gangrene. Patient states a sensation of the penile prosthesis abutting the tip of the penis. EXAM: CT ABDOMEN AND PELVIS WITHOUT CONTRAST TECHNIQUE: Multidetector CT imaging of the abdomen and pelvis was performed following the standard protocol without IV contrast. COMPARISON:  06/19/2019 FINDINGS: Lower chest: Lung bases demonstrate minimal atelectatic changes on the left. No focal effusion or infiltrate is seen. Hepatobiliary: No focal liver abnormality is seen. Status post cholecystectomy. No biliary  dilatation. Pancreas: Unremarkable. No pancreatic ductal dilatation or surrounding inflammatory changes. Spleen: Normal in size without focal  abnormality. Adrenals/Urinary Tract: Adrenal glands are within normal limits. Stomach/Bowel: Kidneys are well visualized bilaterally without renal calculi or obstructive changes. The bladder is partially distended. Vascular/Lymphatic: No obstructive or inflammatory changes of the colon are seen. The appendix is within normal limits. Postsurgical changes in the sigmoid are noted. Small bowel and stomach are unremarkable. Reproductive: Prostate appears within normal limits. There are findings of a penile prosthesis identified. The reservoir in the pelvis appears within normal limits although there is significant Pericatheter inflammatory change surrounding the catheter extending to the pump. The penile portion of the prosthesis extends to the penis tip although no erosive changes are identified. Some inflammatory change is noted surrounding the squeeze pump portion of the prosthesis and extending into the scrotum although no definitive fluid collection is seen. Some calcifications associated with the testicles are noted likely chronic in nature. Other: No free fluid is noted.  No hernia is seen. Musculoskeletal: Postsurgical changes in the lower lumbar spine are noted. Degenerative changes are seen as well. No acute bony abnormality is noted. IMPRESSION: Changes in the scrotum and prepubic soft tissues consistent with localized infection surrounding the catheter and squeeze pump of the penile prosthesis. No definitive abscess is seen at this time. The penile component of the prosthesis does not appear to erode through the skin. No other focal abnormality is noted. Critical Value/emergent results were called by telephone at the time of interpretation on 06/13/2021 at 9:30 pm to Dr. Marda Stalker , who verbally acknowledged these results. Electronically Signed   By: Inez Catalina M.D.   On: 06/13/2021 21:39   DG Chest Port 1 View  Result Date: 06/13/2021 CLINICAL DATA:  Wound infection. EXAM: PORTABLE CHEST 1 VIEW COMPARISON:  Chest x-ray dated November 18, 2020. FINDINGS: The heart size and mediastinal contours are within normal limits. Both lungs are clear. The visualized skeletal structures are unremarkable. IMPRESSION: No active disease. Electronically Signed   By: Titus Dubin M.D.   On: 06/13/2021 20:42   DG Foot 2 Views Left  Result Date: 06/13/2021 CLINICAL DATA:  Wound infection EXAM: LEFT FOOT - 2 VIEW COMPARISON:  10/11/2016 FINDINGS: Bones appear demineralized. There are vascular calcifications. Patient is status post amputation of the fifth digit and partial amputation fourth digit at the level of the MTP joint. Presumed wound lateral aspect of the distal foot. Possible osteopenia and cortical erosive change at the head of the fourth metatarsal. IMPRESSION: Osteopenia with questionable erosive change/osteomyelitis at the head of the fourth metatarsal. Consider correlation with MRI. Electronically Signed   By: Donavan Foil M.D.   On: 06/13/2021 20:43    Procedures None  Medications Ordered in ED Medications  lactated ringers infusion ( Intravenous New Bag/Given 06/13/21 2156)  vancomycin (VANCOREADY) IVPB 2000 mg/400 mL (has no administration in time range)  vancomycin variable dose per unstable renal function (pharmacist dosing) (has no administration in time range)  lactated ringers bolus 1,000 mL (0 mLs Intravenous Stopped 06/13/21 2157)    And  lactated ringers bolus 1,000 mL (1,000 mLs Intravenous New Bag/Given 06/13/21 2123)    And  lactated ringers bolus 1,000 mL (0 mLs Intravenous Stopped 06/13/21 2157)    And  lactated ringers bolus 500 mL (500 mLs Intravenous New Bag/Given 06/13/21 2155)  ceFEPIme (MAXIPIME) 2 g in sodium chloride 0.9 % 100 mL IVPB (0 g Intravenous Stopped 06/13/21 2145)  metroNIDAZOLE (FLAGYL) IVPB 500 mg (0 mg Intravenous  Stopped 06/13/21 2157)    ED Course  I have reviewed  the triage vital signs and the nursing notes.  Pertinent labs & imaging results that were available during my care of the patient were reviewed by me and considered in my medical decision making (see chart for details).  Clinical Course as of 06/13/21 2240  Tue Jun 13, 2021  2052 Abs Immature Granulocytes(!): 0.18 [ED]    Clinical Course User Index [ED] Farrel Gordon, DO   MDM Rules/Calculators/A&P                           Patient presents with T-max of 102, pulse 130, BP 132/56, RR 21 with concern of infected left foot wound versus osteomyelitis.  Code sepsis initiated at this time.  Chest x-ray showed no active disease.  Left foot x-ray showed osteopenia with questionable erosive change/osteomyelitis at the head of the fourth metatarsal.  Patient also expressed concerns over increasingly painful, red, warm area to his penis where he feels that his penile implant is eroding through the skin.  The scrotum is also erythematous and warm and has been progressing over the last several weeks, concerning for Fournier's gangrene.  CT abdomen/pelvis was ordered and showed scrotum and prepubic soft tissues consistent with localized infection surrounding the catheter and squeeze pump of the penile prosthesis, no definitive abscess, no erosion of penile prosthesis through the skin.  CBC resulted with leukocytosis of 21.7, anemia of 12.1, neutrophils of 19.5, monocytes 1.3, absolute immature granulocytes 0.18.  Lactic acid elevated at 3.5.  PT/INR of 18.0/1.5.  Urinalysis, urine culture, blood culture pending.  He is s/p 2 L LR and is in process of getting 1.5 L  with 150 mL/h infusion to follow.  He has received Flagyl 500 mg IV, cefepime 2 g IV with vancomycin ordered and pending.  Consult placed to urology who intends to take the patient to the OR to remove the penile prosthesis either tonight or tomorrow morning, however they feel that he is amenable to a  stepdown unit.  Consult placed to medicine team for admission for sepsis could be originating from left foot wound versus infection of penile prosthesis.  Final Clinical Impression(s) / ED Diagnoses Final diagnoses:  Sepsis, due to unspecified organism, unspecified whether acute organ dysfunction present [facility])    Rx / DC Orders ED Discharge Orders     None        Farrel Gordon, DO 06/13/21 [place], [place], DO 06/13/21 2242    Tegeler, Gwenyth Allegra, MD 06/14/21 0045

## 2021-06-13 NOTE — ED Notes (Signed)
Patient transported to CT 

## 2021-06-13 NOTE — H&P (Deleted)
H&P Physician requesting consult: Harrell Gave Tegeler  Chief Complaint: ? Infected IPP  History of Present Illness: 65 year old male with severe peripheral vascular disease and a history of right BKA as well as multiple left-sided toe amputations as well as multiple other comorbidities including uncontrolled diabetes, end-stage renal disease on dialysis, coronary artery disease who is currently on Eliquis for atrial fibrillation.  He has developed a left-sided foot ulcer that is not healing and his orthopedic surgeon [name] is contemplating amputation.  He was seen by cardiology yesterday and was noted to have atypical atrial flutter. He presented to the emergency department from dialysis today due to his concern of lower left leg infection.  He was noted to be febrile to 102 and developed some hypotension and remained tachycardic.  He was complaining about some scrotal/testicular pain and therefore he underwent CT scan of the abdomen and pelvis that revealed some changes in the scrotum and prepubic soft tissues consistent with localized infection.  There was no definitive abscess and no evidence of erosion or Fournier's gangrene.  Patient has leukocytosis of 21.  He last took his Eliquis about 24 hours ago.  He states he has been having some penile shaft pain and scrotal discomfort for about 3 weeks now.   Past Medical History:  Diagnosis Date   Carotid artery occlusion 11/10/10   LEFT CAROTID ENDARTERECTOMY   Chronic kidney disease    Complication of anesthesia    BP WENT UP AT [facility] "   COPD (chronic obstructive pulmonary disease) ([place])    pt denies this dx as of 06/01/20 - no inhaler    Diabetes mellitus without complication ([place])    Diverticulitis    Diverticulosis of colon (without mention of hemorrhage)    DJD (degenerative joint disease)    knees/hands/feet/back/neck   Fatty liver    Full dentures    GERD (gastroesophageal reflux disease)    H/O hiatal hernia    History of blood  transfusion    with a past surical procedure per patient 06/01/20   Hyperlipidemia    Hypertension    Neuromuscular disorder ([place])    peripheral neuropathy   Non-pressure chronic ulcer of other part of left foot limited to breakdown of skin ([place]) 11/12/2016   Osteomyelitis (H[facility])    left 5th metatarsal   PAD (peripheral artery disease) ([place])    Distal aortogram June 2012. Atherectomy left popliteal artery July 2012.    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   Unstable angina ([place]) 09/16/2018   Wears glasses    Past Surgical History:  Procedure Laterality Date   AMPUTATION  11/05/2011   Procedure: AMPUTATION RAY;  Surgeon: Wylene Simmer, MD;  Location: [place];  Service: Orthopedics;  Laterality: Right;  Amputation of Right 4&5th Toes   AMPUTATION Left 11/26/2012   Procedure: AMPUTATION RAY;  Surgeon: Wylene Simmer, MD;  Location: [place];  Service: Orthopedics;  Laterality: Left;  fourth ray amputation   AMPUTATION Right 08/27/2014   Procedure: Transmetatarsal Amputation;  Surgeon: Newt Minion, MD;  Location: [place];  Service: Orthopedics;  Laterality: Right;   AMPUTATION Right 01/14/2015   Procedure: AMPUTATION BELOW KNEE;  Surgeon: Newt Minion, MD;  Location: [place];  Service: Orthopedics;  Laterality: Right;   AMPUTATION Left 10/21/2015   Procedure: Left Foot 5th Ray Amputation;  Surgeon: Newt Minion, MD;  Location: [place];  Service: Orthopedics;  Laterality: Left;   ANTERIOR FUSION CERVICAL SPINE  02/06/06   C4-5, C5-6, C6-7; SURGEON DR. MAX COHEN   AV FISTULA PLACEMENT Left 06/02/2020   Procedure: ARTERIOVENOUS (AV) FISTULA CREATION LEFT;  Surgeon: Waynetta Sandy, MD;  Location: [place];  Service: Vascular;  Laterality: Left;   BACK SURGERY     x 3   [place] Left 07/21/2020   Procedure: LEFT UPPER ARM ATERIOVENOUS SUPERFISTULALIZATION;   Surgeon: Waynetta Sandy, MD;  Location: [place];  Service: Vascular;  Laterality: Left;   BELOW KNEE LEG AMPUTATION Right    CARDIAC CATHETERIZATION  10/31/04   2009   CAROTID ENDARTERECTOMY  11/10/10   CAROTID ENDARTERECTOMY Left 11/10/2010   Subtotal occlusion of left internal carotid artery with left hemispheric transient ischemic attacks.   CAROTID STENT     CARPAL TUNNEL RELEASE Right 10/21/2013   Procedure: RIGHT CARPAL TUNNEL RELEASE;  Surgeon: Wynonia Sours, MD;  Location: [place];  Service: Orthopedics;  Laterality: Right;   CHOLECYSTECTOMY     COLON SURGERY     COLONOSCOPY     COLOSTOMY REVERSAL  05/21/2018   ileostomy reversal   CYSTOSCOPY WITH STENT PLACEMENT Bilateral 01/13/2018   Procedure: CYSTOSCOPY WITH BILATERAL URETERAL CATHETER PLACEMENT;  Surgeon: Ardis Hughs, MD;  Location: [facility];  Service: Urology;  Laterality: Bilateral;   ESOPHAGEAL MANOMETRY Bilateral 07/19/2014   Procedure: ESOPHAGEAL MANOMETRY (EM);  Surgeon: Jerene Bears, MD;  [facility];  Service: Gastroenterology;  Laterality: Bilateral;   EYE SURGERY Bilateral 2020   cataract   FEMORAL ARTERY STENT     x6   FINGER SURGERY     FOOT SURGERY  04/25/2016    EXCISION BASE 5TH METATARSAL AND PARTIAL CUBOID LEFT FOOT   HERNIA REPAIR     LEFT INGUINAL AND UMBILICAL REPAIRS   HERNIA REPAIR     I & D EXTREMITY Left 04/25/2016   Procedure: EXCISION BASE 5TH METATARSAL AND PARTIAL CUBOID LEFT FOOT;  Surgeon: Newt Minion, MD;  Location: [place];  Service: Orthopedics;  Laterality: Left;   ILEOSTOMY  01/13/2018   Procedure: ILEOSTOMY;  Surgeon: Clovis Riley, MD;  Location: [facility];  Service: General;;   ILEOSTOMY CLOSURE N/A 05/21/2018   Procedure: ILEOSTOMY REVERSAL ERAS PATHWAY;  Surgeon: Clovis Riley, MD;  Location: [place];  Service: General;  Laterality: N/A;   IR RADIOLOGIST EVAL & MGMT  11/19/2017   IR RADIOLOGIST EVAL & MGMT  12/03/2017   IR RADIOLOGIST EVAL & MGMT   12/18/2017   JOINT REPLACEMENT Right 2001   Total knee   LAMINECTOMY     X 3 LUMBAR AND X 2 CERVICAL SPINE OPERATIONS   LAPAROSCOPIC CHOLECYSTECTOMY W/ CHOLANGIOGRAPHY  11/09/04   SURGEON DR. [place] CATH AND CORONARY ANGIOGRAPHY N/A 09/16/2018   Procedure: LEFT HEART CATH AND CORONARY ANGIOGRAPHY;  Surgeon: Nigel Mormon, MD;  Location: [place] [facility];  Service: Cardiovascular;  Laterality: N/A;   LEFT HEART CATHETERIZATION WITH CORONARY ANGIOGRAM N/A 10/29/2014   Procedure: LEFT HEART CATHETERIZATION WITH CORONARY ANGIOGRAM;  Surgeon: Laverda Page, MD;  Location: [facility];  Service: Cardiovascular;  Laterality: N/A;   LIGATION OF COMPETING BRANCHES OF ARTERIOVENOUS FISTULA Left 07/21/2020   Procedure: LIGATION OF COMPETING BRANCHES OF LEFT UPPER ARM ARTERIOVENOUS FISTULA;  Surgeon: Waynetta Sandy, MD;  Location: [place];  Service: Vascular;  Laterality: Left;   LOWER EXTREMITY ANGIOGRAM N/A 03/19/2012   Procedure: LOWER EXTREMITY ANGIOGRAM;  Surgeon: Burnell Blanks, MD;  Location: [facility] CATH LAB;  Service: Cardiovascular;  Laterality: N/A;   NECK SURGERY     PARTIAL COLECTOMY N/A 01/13/2018   Procedure: LAPAROSCOPIC ASSISTED   SIGMOID COLECTOMY ILEOSTOMY;  Surgeon: Clovis Riley, MD;  Location[facility];  Service: General;  Laterality: N/A;   PENILE PROSTHESIS IMPLANT  08/14/05   INFRAPUBIC INSERTION OF INFLATABLE PENILE PROSTHESIS; SURGEON DR. Amalia Hailey   PENILE PROSTHESIS IMPLANT     PERCUTANEOUS CORONARY STENT INTERVENTION (PCI-S) Right 10/29/2014   Procedure: PERCUTANEOUS CORONARY STENT INTERVENTION (PCI-S);  Surgeon: Laverda Page, MD;  Location: [facility] C[facility] LAB;  Service: Cardiovascular;  Laterality: Right;   SHOULDER ARTHROSCOPY     SPINE SURGERY     TOE AMPUTATION Left    TONSILLECTOMY     TOTAL KNEE ARTHROPLASTY  07/2002   RIGHT KNEE ; SURGEON  DR. [name] ALSO HAD ARTHROSCOPIC RIGHT KNEE IN  10/2001   TOTAL KNEE ARTHROPLASTY     ULNAR  NERVE TRANSPOSITION Right 10/21/2013   Procedure: RIGHT ELBOW  ULNAR NERVE DECOMPRESSION;  Surgeon: Wynonia Sours, MD;  Location: [place];  Service: Orthopedics;  Laterality: Right;    Home Medications:  (Not in a hospital admission)  Allergies:  Allergies  Allergen Reactions   Contrast Media [Iodinated Diagnostic Agents]     Difficulty breathing     Ivp Dye [Iodinated Diagnostic Agents] Anaphylaxis and Other (See Comments)    Breathing problems, altered mental state    Adhesive [Tape] Rash    Rash after 3-4 days use    Family History  Problem Relation Age of Onset   Heart disease Father        Before age 62-  CAD, BPG   Diabetes Father        Amputation   Cancer Father        PROSTATE   Hyperlipidemia Father    Hypertension Father    Heart attack Father        Triple BPG   Varicose Veins Father    Colon cancer Brother    Diabetes Brother    Heart disease Brother 52       A-Fib. Before age 18   Hyperlipidemia Brother    Hypertension Brother    Cancer Sister        Breast   Hyperlipidemia Sister    Hypertension Sister    Hypertension Son    Arthritis Other        GRANDMOTHER   Hypertension Other        OTHER FAMILY MEMBERS   Social History:  reports that he quit smoking about 9 years ago. His smoking use included cigarettes. He has a 70.00 pack-year smoking history. He has never used smokeless tobacco. He reports that he does not currently use alcohol. He reports that he does not use drugs.  ROS: A complete review of systems was performed.  All systems are negative except for pertinent findings as noted. ROS   Physical Exam:  Vital signs in last 24 hours: Temp:  [101.1 F (38.4 C)-102 F (38.9 C)] 102 F (38.9 C) (10/04 1934) Pulse Rate:  [71-152] 152 (10/04 2145) Resp:  [15-21] 20 (10/04 2145) BP: (99-132)/(56-81) 125/81 (10/04 2145) SpO2:  [95 %-99 %] 97 % (10/04 2141) Weight:  [108.4 kg] 108.4 kg (10/04 1906) General:  Alert and  oriented, No acute  distress HEENT: Normocephalic, atraumatic Neck: No JVD or lymphadenopathy Cardiovascular: Tachycardic Lungs: Regular rate and effort Abdomen: Soft, nontender, nondistended, no abdominal masses Back: No CVA tenderness Genitourinary: Circumcised phallus with condom catheter in place.  Shaft is mildly tender.  Bilateral testicles are descended without masses.  His scrotal pump is fixed to the skin and immobile consistent with infection/inflammation.  He has some tenderness around the pump.  No obvious abscess.  No obvious crepitus.  No significant skin changes to suggest Fournier's.  Some mild overlying erythema.   Laboratory Data:  Results for orders placed or performed during the hospital encounter of 06/13/21 (from the past 24 hour(s))  Lactic acid, plasma     Status: Abnormal   Collection Time: 06/13/21  7:40 PM  Result Value Ref Range   Lactic Acid, Venous 3.5 (HH) 0.5 - 1.9 mmol/L  Resp Panel by RT-PCR (Flu A&B, Covid) Nasopharyngeal Swab     Status: None   Collection Time: 06/13/21  8:17 PM   Specimen: Nasopharyngeal Swab; Nasopharyngeal(NP) swabs in vial transport medium  Result Value Ref Range   SARS Coronavirus 2 by RT PCR NEGATIVE NEGATIVE   Influenza A by PCR NEGATIVE NEGATIVE   Influenza B by PCR NEGATIVE NEGATIVE  Comprehensive metabolic panel     Status: Abnormal   Collection Time: 06/13/21  8:17 PM  Result Value Ref Range   Sodium 136 135 - 145 mmol/L   Potassium 4.1 3.5 - 5.1 mmol/L   Chloride 91 (L) 98 - 111 mmol/L   CO2 26 22 - 32 mmol/L   Glucose, Bld 336 (H) 70 - 99 mg/dL   BUN 18 8 - 23 mg/dL   Creatinine, Ser 4.23 (H) 0.61 - 1.24 mg/dL   Calcium 8.7 (L) 8.9 - 10.3 mg/dL   Total Protein 6.8 6.5 - 8.1 g/dL   Albumin 2.7 (L) 3.5 - 5.0 g/dL   AST 36 15 - 41 U/L   ALT 22 0 - 44 U/L   Alkaline Phosphatase 123 38 - 126 U/L   Total Bilirubin 1.1 0.3 - 1.2 mg/dL   GFR, Estimated 15 (L) >60 mL/min   Anion gap 19 (H) 5 - 15  CBC WITH DIFFERENTIAL      Status: Abnormal   Collection Time: 06/13/21  8:17 PM  Result Value Ref Range   WBC 21.7 (H) 4.0 - 10.5 K/uL   RBC 4.03 (L) 4.22 - 5.81 MIL/uL   Hemoglobin 12.1 (L) 13.0 - 17.0 g/dL   HCT 36.8 (L) 39.0 - 52.0 %   MCV 91.3 80.0 - 100.0 fL   MCH 30.0 26.0 - 34.0 pg   MCHC 32.9 30.0 - 36.0 g/dL   RDW 13.2 11.5 - 15.5 %   Platelets 358 150 - 400 K/uL   nRBC 0.0 0.0 - 0.2 %   Neutrophils Relative % 90 %   Neutro Abs 19.5 (H) 1.7 - 7.7 K/uL   Lymphocytes Relative 3 %   Lymphs Abs 0.7 0.7 - 4.0 K/uL   Monocytes Relative 6 %   Monocytes Absolute 1.3 (H) 0.1 - 1.0 K/uL   Eosinophils Relative 0 %   Eosinophils Absolute 0.0 0.0 - 0.5 K/uL   Basophils Relative 0 %   Basophils Absolute 0.0 0.0 - 0.1 K/uL   Immature Granulocytes 1 %   Abs Immature Granulocytes 0.18 (H) 0.00 - 0.07 K/uL  Protime-INR     Status: Abnormal   Collection Time: 06/13/21  8:17 PM  Result Value Ref Range  Prothrombin Time 18.0 (H) 11.4 - 15.2 seconds   INR 1.5 (H) 0.8 - 1.2  APTT     Status: None   Collection Time: 06/13/21  8:17 PM  Result Value Ref Range   aPTT 36 24 - 36 seconds   Recent Results (from the past 240 hour(s))  Resp Panel by RT-PCR (Flu A&B, Covid) Nasopharyngeal Swab     Status: None   Collection Time: 06/13/21  8:17 PM   Specimen: Nasopharyngeal Swab; Nasopharyngeal(NP) swabs in vial transport medium  Result Value Ref Range Status   SARS Coronavirus 2 by RT PCR NEGATIVE NEGATIVE Final    Comment: (NOTE) SARS-CoV-2 target nucleic acids are NOT DETECTED.  The SARS-CoV-2 RNA is generally detectable in upper respiratory specimens during the acute phase of infection. The lowest concentration of SARS-CoV-2 viral copies this assay can detect is 138 copies/mL. A negative result does not preclude SARS-Cov-2 infection and should not be used as the sole basis for treatment or other patient management decisions. A negative result may occur with  improper specimen collection/handling, submission  of specimen other than nasopharyngeal swab, presence of viral mutation(s) within the areas targeted by this assay, and inadequate number of viral copies(<138 copies/mL). A negative result must be combined with clinical observations, patient history, and epidemiological information. The expected result is Negative.  Fact Sheet for Patients:  EntrepreneurPulse.com.au  Fact Sheet for Healthcare Providers:  IncredibleEmployment.be  This test is no t yet approved or cleared by the [place] FDA and  has been authorized for detection and/or diagnosis of SARS-CoV-2 by FDA under an Emergency Use Authorization (EUA). This EUA will remain  in effect (meaning this test can be used) for the duration of the COVID-19 declaration under Section 564(b)(1) of the Act, 21 U.S.C.section 360bbb-3(b)(1), unless the authorization is terminated  or revoked sooner.       Influenza A by PCR NEGATIVE NEGATIVE Final   Influenza B by PCR NEGATIVE NEGATIVE Final    Comment: (NOTE) The Xpert Xpress SARS-CoV-2/FLU/RSV plus assay is intended as an aid in the diagnosis of influenza from Nasopharyngeal swab specimens and should not be used as a sole basis for treatment. Nasal washings and aspirates are unacceptable for Xpert Xpress SARS-CoV-2/FLU/RSV testing.  Fact Sheet for Patients: EntrepreneurPulse.com.au  Fact Sheet for Healthcare Providers: IncredibleEmployment.be  This test is not yet approved or cleared by the [place] FDA and has been authorized for detection and/or diagnosis of SARS-CoV-2 by FDA under an Emergency Use Authorization (EUA). This EUA will remain in effect (meaning this test can be used) for the duration of the COVID-19 declaration under Section 564(b)(1) of the Act, 21 U.S.C. section 360bbb-3(b)(1), unless the authorization is terminated or revoked.  Performed at [facility], [place] [address]  [place], [place] [address]    Creatinine: Recent Labs    06/13/21 2017  CREATININE 4.23*   CT scan personally reviewed and is detailed in the history of present illness  Impression/Assessment:  Infected penile prosthesis Sepsis secondary to cellulitis  Plan:  Recommend broad-spectrum antibiotics and also would recommend fungal coverage given his history of diabetes with prostatic infection.  He does need explantation of his penile prosthesis.  Ideally would like to give this another day so that he has some antibiotics in his system and also has time for the Eliquis to clear his system to decrease the risk of bleeding with explantation.  Please make n.p.o. at midnight for possible removal tomorrow evening.  Marton Redwood,  III 06/13/2021, 10:16 PM

## 2021-06-13 NOTE — Sepsis Progress Note (Signed)
Following per sepsis protocol   

## 2021-06-13 NOTE — ED Triage Notes (Addendum)
Pt arrives via EMS after full HD treatment with concerns of left lower leg infection. Pt recently started on abx but worried about possible amputation. Pt states he started to get sicker yesterday.

## 2021-06-13 NOTE — Progress Notes (Signed)
Pharmacy Antibiotic Note  [name] is a 65 y.o. male admitted on 06/13/2021 with sepsis.  Pharmacy has been consulted for cefepime and vancomycin dosing for 7 days of therapy.  Hemodialysis patient presented after full HD session with concerns for left lower leg infection and possible sepsis. Tmax 102, tachycardic at 130 bpm. No labs resulted yet.    Plan: Loading dose of vancomycin 2,000 mg IV Start cefepime 2 g IV x1 Maintenance doses to be determined with HD schedule Follow up cultures, labs, dialysis schedule, and clinical improvement   Height: 6\' 2"  (188 cm) Weight: 108.4 kg (239 lb) IBW/kg (Calculated) : 82.2  Temp (24hrs), Avg:101.6 F (38.7 C), Min:101.1 F (38.4 C), Max:102 F (38.9 C)  No results for input(s): WBC, CREATININE, LATICACIDVEN, VANCOTROUGH, VANCOPEAK, VANCORANDOM, GENTTROUGH, GENTPEAK, GENTRANDOM, TOBRATROUGH, TOBRAPEAK, TOBRARND, AMIKACINPEAK, AMIKACINTROU, AMIKACIN in the last 168 hours.  CrCl cannot be calculated (Patient's most recent lab result is older than the maximum 21 days allowed.).    Allergies  Allergen Reactions   Contrast Media [Iodinated Diagnostic Agents]     Difficulty breathing     Ivp Dye [Iodinated Diagnostic Agents] Anaphylaxis and Other (See Comments)    Breathing problems, altered mental state    Adhesive [Tape] Rash    Rash after 3-4 days use    Antimicrobials this admission: Vancomycin 10/4>> Cefepime 10/4>> Metronidazole 10/4>>  Microbiology results: 10/4 BCx: in process   Thank you for allowing pharmacy to be a part of this patient's care.  [name] 06/13/2021 8:27 PM

## 2021-06-13 NOTE — Consult Note (Addendum)
H&P Physician requesting consult: Harrell Gave Tegeler   Chief Complaint: ? Infected IPP   History of Present Illness: 65 year old male with severe peripheral vascular disease and a history of right BKA as well as multiple left-sided toe amputations as well as multiple other comorbidities including uncontrolled diabetes, end-stage renal disease on dialysis, coronary artery disease who is currently on Eliquis for atrial fibrillation.  He has developed a left-sided foot ulcer that is not healing and his orthopedic surgeon [name] is contemplating amputation.  He was seen by cardiology yesterday and was noted to have atypical atrial flutter. He presented to the emergency department from dialysis today due to his concern of lower left leg infection.  He was noted to be febrile to 102 and developed some hypotension and remained tachycardic.  He was complaining about some scrotal/testicular pain and therefore he underwent CT scan of the abdomen and pelvis that revealed some changes in the scrotum and prepubic soft tissues consistent with localized infection.  There was no definitive abscess and no evidence of erosion or Fournier's gangrene.  Patient has leukocytosis of 21.  He last took his Eliquis about 24 hours ago.  He states he has been having some penile shaft pain and scrotal discomfort for about 3 weeks now.         Past Medical History:  Diagnosis Date   Carotid artery occlusion 11/10/10    LEFT CAROTID ENDARTERECTOMY   Chronic kidney disease     Complication of anesthesia      BP WENT UP AT [facility] "   COPD (chronic obstructive pulmonary disease) ([place])      pt denies this dx as of 06/01/20 - no inhaler    Diabetes mellitus without complication ([place])     Diverticulitis     Diverticulosis of colon (without mention of hemorrhage)     DJD (degenerative joint disease)      knees/hands/feet/back/neck   Fatty liver     Full dentures     GERD (gastroesophageal reflux disease)     H/O hiatal hernia      History of blood transfusion      with a past surical procedure per patient 06/01/20   Hyperlipidemia     Hypertension     Neuromuscular disorder ([place])      peripheral neuropathy   Non-pressure chronic ulcer of other part of left foot limited to breakdown of skin ([place]) 11/12/2016   Osteomyelitis (H[facility])      left 5th metatarsal   PAD (peripheral artery disease) ([place])      Distal aortogram June 2012. Atherectomy left popliteal artery July 2012.    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   Unstable angina ([place]) 09/16/2018   Wears glasses           Past Surgical History:  Procedure Laterality Date   AMPUTATION   11/05/2011    Procedure: AMPUTATION RAY;  Surgeon: Wylene Simmer, MD;  Location: [facility];  Service: Orthopedics;  Laterality: Right;  Amputation of Right 4&5th Toes   AMPUTATION Left 11/26/2012    Procedure: AMPUTATION RAY;  Surgeon: Wylene Simmer, MD;  Location: [place];  Service: Orthopedics;  Laterality: Left;  fourth ray amputation   AMPUTATION Right 08/27/2014    Procedure: Transmetatarsal Amputation;  Surgeon: Newt Minion, MD;  Location: [facility]  OR;  Service: Orthopedics;  Laterality: Right;   AMPUTATION Right 01/14/2015    Procedure: AMPUTATION BELOW KNEE;  Surgeon: Newt Minion, MD;  Location: [facility];  Service: Orthopedics;  Laterality: Right;   AMPUTATION Left 10/21/2015    Procedure: Left Foot 5th Ray Amputation;  Surgeon: Newt Minion, MD;  Location: [facility];  Service: Orthopedics;  Laterality: Left;   ANTERIOR FUSION CERVICAL SPINE   02/06/06    C4-5, C5-6, C6-7; SURGEON DR. MAX COHEN   AV FISTULA PLACEMENT Left 06/02/2020    Procedure: ARTERIOVENOUS (AV) FISTULA CREATION LEFT;  Surgeon: Waynetta Sandy, MD;  Location: [facility];  Service: Vascular;  Laterality: Left;   BACK SURGERY        x 3   [place] Left 07/21/2020    Procedure:  LEFT UPPER ARM ATERIOVENOUS SUPERFISTULALIZATION;  Surgeon: Waynetta Sandy, MD;  Location: [place];  Service: Vascular;  Laterality: Left;   BELOW KNEE LEG AMPUTATION Right     CARDIAC CATHETERIZATION   10/31/04    2009   CAROTID ENDARTERECTOMY   11/10/10   CAROTID ENDARTERECTOMY Left 11/10/2010    Subtotal occlusion of left internal carotid artery with left hemispheric transient ischemic attacks.   CAROTID STENT       CARPAL TUNNEL RELEASE Right 10/21/2013    Procedure: RIGHT CARPAL TUNNEL RELEASE;  Surgeon: Wynonia Sours, MD;  Location: [facility];  Service: Orthopedics;  Laterality: Right;   CHOLECYSTECTOMY       COLON SURGERY       COLONOSCOPY       COLOSTOMY REVERSAL   05/21/2018    ileostomy reversal   CYSTOSCOPY WITH STENT PLACEMENT Bilateral 01/13/2018    Procedure: CYSTOSCOPY WITH BILATERAL URETERAL CATHETER PLACEMENT;  Surgeon: Ardis Hughs, MD;  Location: [facility];  Service: Urology;  Laterality: Bilateral;   ESOPHAGEAL MANOMETRY Bilateral 07/19/2014    Procedure: ESOPHAGEAL MANOMETRY (EM);  Surgeon: Jerene Bears, MD;  Location: [facility];  Service: Gastroenterology;  Laterality: Bilateral;   EYE SURGERY Bilateral 2020    cataract   FEMORAL ARTERY STENT        x6   FINGER SURGERY       FOOT SURGERY   04/25/2016     EXCISION BASE 5TH METATARSAL AND PARTIAL CUBOID LEFT FOOT   HERNIA REPAIR        LEFT INGUINAL AND UMBILICAL REPAIRS   HERNIA REPAIR       I & D EXTREMITY Left 04/25/2016    Procedure: EXCISION BASE 5TH METATARSAL AND PARTIAL CUBOID LEFT FOOT;  Surgeon: Newt Minion, MD;  Location: [facility];  Service: Orthopedics;  Laterality: Left;   ILEOSTOMY   01/13/2018    Procedure: ILEOSTOMY;  Surgeon: Clovis Riley, MD;  Location: [facility];  Service: General;;   ILEOSTOMY CLOSURE N/A 05/21/2018    Procedure: ILEOSTOMY REVERSAL ERAS PATHWAY;  Surgeon: Clovis Riley, MD;  Location: [facility];  Service: General;  Laterality: N/A;   IR RADIOLOGIST EVAL &  MGMT   11/19/2017   IR RADIOLOGIST EVAL & MGMT   12/03/2017   IR RADIOLOGIST EVAL & MGMT   12/18/2017   JOINT REPLACEMENT Right 2001    Total knee   LAMINECTOMY        X 3 LUMBAR AND X 2 CERVICAL SPINE OPERATIONS   LAPAROSCOPIC CHOLECYSTECTOMY W/ CHOLANGIOGRAPHY   11/09/04    SURGEON DR. Forsyth CATH AND CORONARY  ANGIOGRAPHY N/A 09/16/2018    Procedure: LEFT HEART CATH AND CORONARY ANGIOGRAPHY;  Surgeon: Nigel Mormon, MD;  Location: [facility];  Service: Cardiovascular;  Laterality: N/A;   LEFT HEART CATHETERIZATION WITH CORONARY ANGIOGRAM N/A 10/29/2014    Procedure: LEFT HEART CATHETERIZATION WITH CORONARY ANGIOGRAM;  Surgeon: Laverda Page, MD;  Location: [facility] CATH LAB;  Service: Cardiovascular;  Laterality: N/A;   LIGATION OF COMPETING BRANCHES OF ARTERIOVENOUS FISTULA Left 07/21/2020    Procedure: LIGATION OF COMPETING BRANCHES OF LEFT UPPER ARM ARTERIOVENOUS FISTULA;  Surgeon: Waynetta Sandy, MD;  Location: [place];  Service: Vascular;  Laterality: Left;   LOWER EXTREMITY ANGIOGRAM N/A 03/19/2012    Procedure: LOWER EXTREMITY ANGIOGRAM;  Surgeon: Burnell Blanks, MD;  Location: [facility] CATH LAB;  Service: Cardiovascular;  Laterality: N/A;   NECK SURGERY       PARTIAL COLECTOMY N/A 01/13/2018    Procedure: LAPAROSCOPIC ASSISTED   SIGMOID COLECTOMY ILEOSTOMY;  Surgeon: Clovis Riley, MD;  Location: [facility];  Service: General;  Laterality: N/A;   PENILE PROSTHESIS IMPLANT   08/14/05    INFRAPUBIC INSERTION OF INFLATABLE PENILE PROSTHESIS; SURGEON DR. Amalia Hailey   PENILE PROSTHESIS IMPLANT       PERCUTANEOUS CORONARY STENT INTERVENTION (PCI-S) Right 10/29/2014    Procedure: PERCUTANEOUS CORONARY STENT INTERVENTION (PCI-S);  Surgeon: Laverda Page, MD;  Location: [facility] CATH LAB;  Service: Cardiovascular;  Laterality: Right;   SHOULDER ARTHROSCOPY       SPINE SURGERY       TOE AMPUTATION Left     TONSILLECTOMY       TOTAL KNEE ARTHROPLASTY   07/2002     RIGHT KNEE ; SURGEON  DR. [name] ALSO HAD ARTHROSCOPIC RIGHT KNEE IN  10/2001   TOTAL KNEE ARTHROPLASTY       ULNAR NERVE TRANSPOSITION Right 10/21/2013    Procedure: RIGHT ELBOW  ULNAR NERVE DECOMPRESSION;  Surgeon: Wynonia Sours, MD;  Location: [place];  Service: Orthopedics;  Laterality: Right;      Home Medications:  (Not in a hospital admission)   Allergies:       Allergies  Allergen Reactions   Contrast Media [Iodinated Diagnostic Agents]        Difficulty breathing       Ivp Dye [Iodinated Diagnostic Agents] Anaphylaxis and Other (See Comments)      Breathing problems, altered mental state     Adhesive [Tape] Rash      Rash after 3-4 days use           Family History  Problem Relation Age of Onset   Heart disease Father          Before age 81-  CAD, BPG   Diabetes Father          Amputation   Cancer Father          PROSTATE   Hyperlipidemia Father     Hypertension Father     Heart attack Father          Triple BPG   Varicose Veins Father     Colon cancer Brother     Diabetes Brother     Heart disease Brother 86        A-Fib. Before age 33   Hyperlipidemia Brother     Hypertension Brother     Cancer Sister          Breast   Hyperlipidemia Sister     Hypertension Sister  Hypertension Son     Arthritis Other          GRANDMOTHER   Hypertension Other          OTHER FAMILY MEMBERS    Social History:  reports that he quit smoking about 9 years ago. His smoking use included cigarettes. He has a 70.00 pack-year smoking history. He has never used smokeless tobacco. He reports that he does not currently use alcohol. He reports that he does not use drugs.   ROS: A complete review of systems was performed.  All systems are negative except for pertinent findings as noted. ROS     Physical Exam:  Vital signs in last 24 hours: Temp:  [101.1 F (38.4 C)-102 F (38.9 C)] 102 F (38.9 C) (10/04 1934) Pulse Rate:  [71-152] 152 (10/04  2145) Resp:  [15-21] 20 (10/04 2145) BP: (99-132)/(56-81) 125/81 (10/04 2145) SpO2:  [95 %-99 %] 97 % (10/04 2141) Weight:  [108.4 kg] 108.4 kg (10/04 1906) General:  Alert and oriented, No acute distress HEENT: Normocephalic, atraumatic Neck: No JVD or lymphadenopathy Cardiovascular: Tachycardic Lungs: Regular rate and effort Abdomen: Soft, nontender, nondistended, no abdominal masses Back: No CVA tenderness Genitourinary: Circumcised phallus with condom catheter in place.  Shaft is mildly tender.  Bilateral testicles are descended without masses.  His scrotal pump is fixed to the skin and immobile consistent with infection/inflammation.  He has some tenderness around the pump.  No obvious abscess.  No obvious crepitus.  No significant skin changes to suggest Fournier's.  Some mild overlying erythema.     Laboratory Data:  Lab Results Last 24 Hours       Results for orders placed or performed during the hospital encounter of 06/13/21 (from the past 24 hour(s))  Lactic acid, plasma     Status: Abnormal    Collection Time: 06/13/21  7:40 PM  Result Value Ref Range    Lactic Acid, Venous 3.5 (HH) 0.5 - 1.9 mmol/L  Resp Panel by RT-PCR (Flu A&B, Covid) Nasopharyngeal Swab     Status: None    Collection Time: 06/13/21  8:17 PM    Specimen: Nasopharyngeal Swab; Nasopharyngeal(NP) swabs in vial transport medium  Result Value Ref Range    SARS Coronavirus 2 by RT PCR NEGATIVE NEGATIVE    Influenza A by PCR NEGATIVE NEGATIVE    Influenza B by PCR NEGATIVE NEGATIVE  Comprehensive metabolic panel     Status: Abnormal    Collection Time: 06/13/21  8:17 PM  Result Value Ref Range    Sodium 136 135 - 145 mmol/L    Potassium 4.1 3.5 - 5.1 mmol/L    Chloride 91 (L) 98 - 111 mmol/L    CO2 26 22 - 32 mmol/L    Glucose, Bld 336 (H) 70 - 99 mg/dL    BUN 18 8 - 23 mg/dL    Creatinine, Ser 4.23 (H) 0.61 - 1.24 mg/dL    Calcium 8.7 (L) 8.9 - 10.3 mg/dL    Total Protein 6.8 6.5 - 8.1 g/dL     Albumin 2.7 (L) 3.5 - 5.0 g/dL    AST 36 15 - 41 U/L    ALT 22 0 - 44 U/L    Alkaline Phosphatase 123 38 - 126 U/L    Total Bilirubin 1.1 0.3 - 1.2 mg/dL    GFR, Estimated 15 (L) >60 mL/min    Anion gap 19 (H) 5 - 15  CBC WITH DIFFERENTIAL     Status: Abnormal  Collection Time: 06/13/21  8:17 PM  Result Value Ref Range    WBC 21.7 (H) 4.0 - 10.5 K/uL    RBC 4.03 (L) 4.22 - 5.81 MIL/uL    Hemoglobin 12.1 (L) 13.0 - 17.0 g/dL    HCT 36.8 (L) 39.0 - 52.0 %    MCV 91.3 80.0 - 100.0 fL    MCH 30.0 26.0 - 34.0 pg    MCHC 32.9 30.0 - 36.0 g/dL    RDW 13.2 11.5 - 15.5 %    Platelets 358 150 - 400 K/uL    nRBC 0.0 0.0 - 0.2 %    Neutrophils Relative % 90 %    Neutro Abs 19.5 (H) 1.7 - 7.7 K/uL    Lymphocytes Relative 3 %    Lymphs Abs 0.7 0.7 - 4.0 K/uL    Monocytes Relative 6 %    Monocytes Absolute 1.3 (H) 0.1 - 1.0 K/uL    Eosinophils Relative 0 %    Eosinophils Absolute 0.0 0.0 - 0.5 K/uL    Basophils Relative 0 %    Basophils Absolute 0.0 0.0 - 0.1 K/uL    Immature Granulocytes 1 %    Abs Immature Granulocytes 0.18 (H) 0.00 - 0.07 K/uL  Protime-INR     Status: Abnormal    Collection Time: 06/13/21  8:17 PM  Result Value Ref Range    Prothrombin Time 18.0 (H) 11.4 - 15.2 seconds    INR 1.5 (H) 0.8 - 1.2  APTT     Status: None    Collection Time: 06/13/21  8:17 PM  Result Value Ref Range    aPTT 36 24 - 36 seconds             Recent Results (from the past 240 hour(s))  Resp Panel by RT-PCR (Flu A&B, Covid) Nasopharyngeal Swab     Status: None    Collection Time: 06/13/21  8:17 PM    Specimen: Nasopharyngeal Swab; Nasopharyngeal(NP) swabs in vial transport medium  Result Value Ref Range Status    SARS Coronavirus 2 by RT PCR NEGATIVE NEGATIVE Final      Comment: (NOTE) SARS-CoV-2 target nucleic acids are NOT DETECTED.   The SARS-CoV-2 RNA is generally detectable in upper respiratory specimens during the acute phase of infection. The lowest concentration of SARS-CoV-2  viral copies this assay can detect is 138 copies/mL. A negative result does not preclude SARS-Cov-2 infection and should not be used as the sole basis for treatment or other patient management decisions. A negative result may occur with  improper specimen collection/handling, submission of specimen other than nasopharyngeal swab, presence of viral mutation(s) within the areas targeted by this assay, and inadequate number of viral copies(<138 copies/mL). A negative result must be combined with clinical observations, patient history, and epidemiological information. The expected result is Negative.   Fact Sheet for Patients:  EntrepreneurPulse.com.au   Fact Sheet for Healthcare Providers:  IncredibleEmployment.be   This test is no t yet approved or cleared by the [place] FDA and  has been authorized for detection and/or diagnosis of SARS-CoV-2 by FDA under an Emergency Use Authorization (EUA). This EUA will remain  in effect (meaning this test can be used) for the duration of the COVID-19 declaration under Section 564(b)(1) of the Act, 21 U.S.C.section 360bbb-3(b)(1), unless the authorization is terminated  or revoked sooner.           Influenza A by PCR NEGATIVE NEGATIVE Final    Influenza B by PCR NEGATIVE NEGATIVE  Final      Comment: (NOTE) The Xpert Xpress SARS-CoV-2/FLU/RSV plus assay is intended as an aid in the diagnosis of influenza from Nasopharyngeal swab specimens and should not be used as a sole basis for treatment. Nasal washings and aspirates are unacceptable for Xpert Xpress SARS-CoV-2/FLU/RSV testing.   Fact Sheet for Patients: EntrepreneurPulse.com.au   Fact Sheet for Healthcare Providers: IncredibleEmployment.be   This test is not yet approved or cleared by the [place] FDA and has been authorized for detection and/or diagnosis of SARS-CoV-2 by FDA under an Emergency Use  Authorization (EUA). This EUA will remain in effect (meaning this test can be used) for the duration of the COVID-19 declaration under Section 564(b)(1) of the Act, 21 U.S.C. section 360bbb-3(b)(1), unless the authorization is terminated or revoked.   Performed at [facility], [place] [address] [place], [place] [address]      Creatinine:    Recent Labs    06/13/21 2017  CREATININE 4.23*    CT scan personally reviewed and is detailed in the history of present illness   Impression/Assessment:  Infected penile prosthesis Sepsis secondary to cellulitis   Plan:  Recommend broad-spectrum antibiotics and also would recommend fungal coverage given his history of diabetes with possible prostatic infection.  He does need explantation of his penile prosthesis.  Ideally would like to give this another day so that he has some antibiotics in his system and also has time for the Eliquis to clear his system to decrease the risk of bleeding with explantation.  Please make n.p.o. at midnight for possible removal tomorrow evening.  Continue to hold Eliquis.

## 2021-06-14 ENCOUNTER — Encounter (HOSPITAL_COMMUNITY): Payer: Self-pay | Admitting: Internal Medicine

## 2021-06-14 ENCOUNTER — Inpatient Hospital Stay (HOSPITAL_COMMUNITY): Payer: 59 | Admitting: Anesthesiology

## 2021-06-14 ENCOUNTER — Encounter (HOSPITAL_COMMUNITY)
Admission: EM | Disposition: A | Discharge status: Home Health | Payer: Self-pay | Source: Home / Self Care | Attending: Internal Medicine

## 2021-06-14 DIAGNOSIS — A4181 Sepsis due to Enterococcus: Secondary | ICD-10-CM | POA: Diagnosis present

## 2021-06-14 DIAGNOSIS — E872 Acidosis, unspecified: Secondary | ICD-10-CM

## 2021-06-14 DIAGNOSIS — L03116 Cellulitis of left lower limb: Secondary | ICD-10-CM | POA: Diagnosis present

## 2021-06-14 DIAGNOSIS — R6520 Severe sepsis without septic shock: Secondary | ICD-10-CM | POA: Diagnosis present

## 2021-06-14 DIAGNOSIS — A419 Sepsis, unspecified organism: Secondary | ICD-10-CM

## 2021-06-14 DIAGNOSIS — Y738 Miscellaneous gastroenterology and urology devices associated with adverse incidents, not elsewhere classified: Secondary | ICD-10-CM | POA: Diagnosis not present

## 2021-06-14 DIAGNOSIS — E119 Type 2 diabetes mellitus without complications: Secondary | ICD-10-CM | POA: Diagnosis not present

## 2021-06-14 DIAGNOSIS — I2510 Atherosclerotic heart disease of native coronary artery without angina pectoris: Secondary | ICD-10-CM | POA: Diagnosis not present

## 2021-06-14 DIAGNOSIS — N186 End stage renal disease: Secondary | ICD-10-CM

## 2021-06-14 DIAGNOSIS — Z7189 Other specified counseling: Secondary | ICD-10-CM | POA: Diagnosis not present

## 2021-06-14 DIAGNOSIS — E1152 Type 2 diabetes mellitus with diabetic peripheral angiopathy with gangrene: Secondary | ICD-10-CM | POA: Diagnosis present

## 2021-06-14 DIAGNOSIS — T8361XA Infection and inflammatory reaction due to implanted penile prosthesis, initial encounter: Secondary | ICD-10-CM | POA: Diagnosis present

## 2021-06-14 DIAGNOSIS — Z23 Encounter for immunization: Secondary | ICD-10-CM | POA: Diagnosis not present

## 2021-06-14 DIAGNOSIS — M869 Osteomyelitis, unspecified: Secondary | ICD-10-CM | POA: Diagnosis present

## 2021-06-14 DIAGNOSIS — N390 Urinary tract infection, site not specified: Secondary | ICD-10-CM | POA: Diagnosis present

## 2021-06-14 DIAGNOSIS — E1169 Type 2 diabetes mellitus with other specified complication: Secondary | ICD-10-CM | POA: Diagnosis present

## 2021-06-14 DIAGNOSIS — L89323 Pressure ulcer of left buttock, stage 3: Secondary | ICD-10-CM | POA: Diagnosis not present

## 2021-06-14 DIAGNOSIS — I739 Peripheral vascular disease, unspecified: Secondary | ICD-10-CM | POA: Diagnosis not present

## 2021-06-14 DIAGNOSIS — Y831 Surgical operation with implant of artificial internal device as the cause of abnormal reaction of the patient, or of later complication, without mention of misadventure at the time of the procedure: Secondary | ICD-10-CM | POA: Diagnosis present

## 2021-06-14 DIAGNOSIS — L97521 Non-pressure chronic ulcer of other part of left foot limited to breakdown of skin: Secondary | ICD-10-CM | POA: Diagnosis not present

## 2021-06-14 DIAGNOSIS — N4822 Cellulitis of corpus cavernosum and penis: Secondary | ICD-10-CM | POA: Diagnosis present

## 2021-06-14 DIAGNOSIS — E1122 Type 2 diabetes mellitus with diabetic chronic kidney disease: Secondary | ICD-10-CM | POA: Diagnosis present

## 2021-06-14 DIAGNOSIS — I5032 Chronic diastolic (congestive) heart failure: Secondary | ICD-10-CM | POA: Diagnosis present

## 2021-06-14 DIAGNOSIS — I87332 Chronic venous hypertension (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: Secondary | ICD-10-CM | POA: Diagnosis not present

## 2021-06-14 DIAGNOSIS — L89313 Pressure ulcer of right buttock, stage 3: Secondary | ICD-10-CM | POA: Diagnosis not present

## 2021-06-14 DIAGNOSIS — L0390 Cellulitis, unspecified: Secondary | ICD-10-CM | POA: Diagnosis not present

## 2021-06-14 DIAGNOSIS — Z992 Dependence on renal dialysis: Secondary | ICD-10-CM | POA: Diagnosis not present

## 2021-06-14 DIAGNOSIS — D631 Anemia in chronic kidney disease: Secondary | ICD-10-CM | POA: Diagnosis present

## 2021-06-14 DIAGNOSIS — Z515 Encounter for palliative care: Secondary | ICD-10-CM | POA: Diagnosis not present

## 2021-06-14 DIAGNOSIS — A480 Gas gangrene: Secondary | ICD-10-CM | POA: Diagnosis present

## 2021-06-14 DIAGNOSIS — E1142 Type 2 diabetes mellitus with diabetic polyneuropathy: Secondary | ICD-10-CM | POA: Diagnosis present

## 2021-06-14 DIAGNOSIS — J449 Chronic obstructive pulmonary disease, unspecified: Secondary | ICD-10-CM | POA: Diagnosis present

## 2021-06-14 DIAGNOSIS — E11649 Type 2 diabetes mellitus with hypoglycemia without coma: Secondary | ICD-10-CM | POA: Diagnosis not present

## 2021-06-14 DIAGNOSIS — I132 Hypertensive heart and chronic kidney disease with heart failure and with stage 5 chronic kidney disease, or end stage renal disease: Secondary | ICD-10-CM | POA: Diagnosis present

## 2021-06-14 DIAGNOSIS — Z20822 Contact with and (suspected) exposure to covid-19: Secondary | ICD-10-CM | POA: Diagnosis present

## 2021-06-14 DIAGNOSIS — K760 Fatty (change of) liver, not elsewhere classified: Secondary | ICD-10-CM | POA: Diagnosis present

## 2021-06-14 DIAGNOSIS — I480 Paroxysmal atrial fibrillation: Secondary | ICD-10-CM | POA: Diagnosis present

## 2021-06-14 HISTORY — PX: REMOVAL OF PENILE PROSTHESIS: SHX6059

## 2021-06-14 LAB — CBG MONITORING, ED
Glucose-Capillary: 355 mg/dL — ABNORMAL HIGH (ref 70–99)
Glucose-Capillary: 284 mg/dL — ABNORMAL HIGH (ref 70–99)
Glucose-Capillary: 216 mg/dL — ABNORMAL HIGH (ref 70–99)
Glucose-Capillary: 150 mg/dL — ABNORMAL HIGH (ref 70–99)
Glucose-Capillary: 160 mg/dL — ABNORMAL HIGH (ref 70–99)

## 2021-06-14 LAB — HEPATITIS B SURFACE ANTIGEN: Hepatitis B Surface Ag: NONREACTIVE

## 2021-06-14 LAB — CBC
HCT: 30.6 % — ABNORMAL LOW (ref 39.0–52.0)
Hemoglobin: 10.1 g/dL — ABNORMAL LOW (ref 13.0–17.0)
MCH: 30.7 pg (ref 26.0–34.0)
MCHC: 33 g/dL (ref 30.0–36.0)
MCV: 93 fL (ref 80.0–100.0)
Platelets: 241 10*3/uL (ref 150–400)
RBC: 3.29 MIL/uL — ABNORMAL LOW (ref 4.22–5.81)
RDW: 13.2 % (ref 11.5–15.5)
WBC: 15.2 10*3/uL — ABNORMAL HIGH (ref 4.0–10.5)
nRBC: 0 % (ref 0.0–0.2)

## 2021-06-14 LAB — URINALYSIS, ROUTINE W REFLEX MICROSCOPIC
Bilirubin Urine: NEGATIVE
Glucose, UA: >=500 mg/dL — AB
Ketones, ur: 5 mg/dL — AB
Nitrite: NEGATIVE
Protein, ur: 100 mg/dL — AB
Specific Gravity, Urine: 1.013 (ref 1.005–1.030)
WBC, UA: >50 WBC/hpf — ABNORMAL HIGH (ref 0–5)
pH: 7 (ref 5.0–8.0)

## 2021-06-14 LAB — LACTIC ACID, PLASMA: Lactic Acid, Venous: 2.4 mmol/L (ref 0.5–1.9)

## 2021-06-14 LAB — BASIC METABOLIC PANEL
Anion gap: 12 (ref 5–15)
BUN: 24 mg/dL — ABNORMAL HIGH (ref 8–23)
CO2: 27 mmol/L (ref 22–32)
Calcium: 7.8 mg/dL — ABNORMAL LOW (ref 8.9–10.3)
Chloride: 94 mmol/L — ABNORMAL LOW (ref 98–111)
Creatinine, Ser: 4.28 mg/dL — ABNORMAL HIGH (ref 0.61–1.24)
GFR, Estimated: 14 mL/min — ABNORMAL LOW (ref >60)
Glucose, Bld: 392 mg/dL — ABNORMAL HIGH (ref 70–99)
Potassium: 4.1 mmol/L (ref 3.5–5.1)
Sodium: 133 mmol/L — ABNORMAL LOW (ref 135–145)

## 2021-06-14 LAB — HEPATITIS B SURFACE ANTIBODY,QUALITATIVE: Hep B S Ab: REACTIVE — AB

## 2021-06-14 LAB — GLUCOSE, CAPILLARY
Glucose-Capillary: 183 mg/dL — ABNORMAL HIGH (ref 70–99)
Glucose-Capillary: 187 mg/dL — ABNORMAL HIGH (ref 70–99)

## 2021-06-14 LAB — PROCALCITONIN: Procalcitonin: 3.07 ng/mL

## 2021-06-14 LAB — HEMOGLOBIN A1C
Hgb A1c MFr Bld: 7.9 % — ABNORMAL HIGH (ref 4.8–5.6)
Mean Plasma Glucose: 180.03 mg/dL

## 2021-06-14 LAB — HIV ANTIBODY (ROUTINE TESTING W REFLEX): HIV Screen 4th Generation wRfx: NONREACTIVE

## 2021-06-14 LAB — PROTIME-INR
INR: 1.5 — ABNORMAL HIGH (ref 0.8–1.2)
Prothrombin Time: 17.9 seconds — ABNORMAL HIGH (ref 11.4–15.2)

## 2021-06-14 SURGERY — REMOVAL, PENILE PROSTHESIS
Anesthesia: General | Site: Penis

## 2021-06-14 MED ORDER — LIDOCAINE-PRILOCAINE 2.5-2.5 % EX CREA
1.0000 "application " | TOPICAL_CREAM | CUTANEOUS | Status: DC | PRN
  Filled 2021-06-14: qty 5

## 2021-06-14 MED ORDER — KETAMINE HCL 10 MG/ML IJ SOLN
INTRAMUSCULAR | Status: DC | PRN
  Administered 2021-06-14: 20 mg via INTRAVENOUS

## 2021-06-14 MED ORDER — DEXAMETHASONE SODIUM PHOSPHATE 10 MG/ML IJ SOLN
INTRAMUSCULAR | Status: AC
  Filled 2021-06-14: qty 1

## 2021-06-14 MED ORDER — SODIUM CHLORIDE 0.9 % IV SOLN: 100.0000 mL | INTRAVENOUS | Status: DC | PRN

## 2021-06-14 MED ORDER — ACETAMINOPHEN 500 MG PO TABS
1000.0000 mg | ORAL_TABLET | Freq: Four times a day (QID) | ORAL | Status: DC | PRN
  Administered 2021-06-14 (×2): 1000 mg via ORAL
  Filled 2021-06-14 (×2): qty 2

## 2021-06-14 MED ORDER — MIDAZOLAM HCL 5 MG/5ML IJ SOLN
INTRAMUSCULAR | Status: DC | PRN
  Administered 2021-06-14: 1 mg via INTRAVENOUS

## 2021-06-14 MED ORDER — LIDOCAINE 2% (20 MG/ML) 5 ML SYRINGE
INTRAMUSCULAR | Status: AC
  Filled 2021-06-14: qty 5

## 2021-06-14 MED ORDER — ETOMIDATE 2 MG/ML IV SOLN
INTRAVENOUS | Status: DC | PRN
  Administered 2021-06-14: 16 mg via INTRAVENOUS

## 2021-06-14 MED ORDER — FLUCONAZOLE IN SODIUM CHLORIDE 200-0.9 MG/100ML-% IV SOLN
200.0000 mg | Freq: Every day | INTRAVENOUS | Status: DC
  Administered 2021-06-15 – 2021-06-17 (×2): 200 mg via INTRAVENOUS
  Filled 2021-06-14 (×2): qty 100

## 2021-06-14 MED ORDER — SODIUM CHLORIDE 0.9 % IV SOLN: INTRAVENOUS | Status: DC

## 2021-06-14 MED ORDER — ETOMIDATE 2 MG/ML IV SOLN
INTRAVENOUS | Status: AC
  Filled 2021-06-14: qty 10

## 2021-06-14 MED ORDER — SUCCINYLCHOLINE CHLORIDE 200 MG/10ML IV SOSY
PREFILLED_SYRINGE | INTRAVENOUS | Status: DC | PRN
  Administered 2021-06-14: 120 mg via INTRAVENOUS

## 2021-06-14 MED ORDER — LIDOCAINE HCL (PF) 1 % IJ SOLN: 5.0000 mL | INTRAMUSCULAR | Status: DC | PRN

## 2021-06-14 MED ORDER — PHENYLEPHRINE HCL-NACL 20-0.9 MG/250ML-% IV SOLN
INTRAVENOUS | Status: DC | PRN
  Administered 2021-06-14: 40 ug/min via INTRAVENOUS

## 2021-06-14 MED ORDER — FENTANYL CITRATE (PF) 250 MCG/5ML IJ SOLN
INTRAMUSCULAR | Status: AC
  Filled 2021-06-14: qty 5

## 2021-06-14 MED ORDER — PENTAFLUOROPROP-TETRAFLUOROETH EX AERO
1.0000 "application " | INHALATION_SPRAY | CUTANEOUS | Status: DC | PRN
  Filled 2021-06-14: qty 116

## 2021-06-14 MED ORDER — ALBUMIN HUMAN 5 % IV SOLN: INTRAVENOUS | Status: DC | PRN

## 2021-06-14 MED ORDER — ROCURONIUM BROMIDE 10 MG/ML (PF) SYRINGE
PREFILLED_SYRINGE | INTRAVENOUS | Status: AC
  Filled 2021-06-14: qty 10

## 2021-06-14 MED ORDER — VANCOMYCIN HCL IN DEXTROSE 1-5 GM/200ML-% IV SOLN
1000.0000 mg | INTRAVENOUS | Status: DC
  Administered 2021-06-15 – 2021-06-27 (×6): 1000 mg via INTRAVENOUS
  Filled 2021-06-14 (×14): qty 200

## 2021-06-14 MED ORDER — BUPIVACAINE HCL (PF) 0.25 % IJ SOLN
INTRAMUSCULAR | Status: AC
  Filled 2021-06-14: qty 30

## 2021-06-14 MED ORDER — CHLORHEXIDINE GLUCONATE CLOTH 2 % EX PADS
6.0000 | MEDICATED_PAD | Freq: Every day | CUTANEOUS | Status: DC
  Administered 2021-06-15 – 2021-06-30 (×16): 6 via TOPICAL

## 2021-06-14 MED ORDER — FENTANYL CITRATE (PF) 100 MCG/2ML IJ SOLN
25.0000 ug | INTRAMUSCULAR | Status: DC | PRN
  Administered 2021-06-14 (×3): 50 ug via INTRAVENOUS

## 2021-06-14 MED ORDER — PHENYLEPHRINE HCL (PRESSORS) 10 MG/ML IV SOLN
INTRAVENOUS | Status: DC | PRN
  Administered 2021-06-14: 160 ug via INTRAVENOUS

## 2021-06-14 MED ORDER — SUCCINYLCHOLINE CHLORIDE 200 MG/10ML IV SOSY
PREFILLED_SYRINGE | INTRAVENOUS | Status: AC
  Filled 2021-06-14: qty 10

## 2021-06-14 MED ORDER — ONDANSETRON HCL 4 MG/2ML IJ SOLN
INTRAMUSCULAR | Status: DC | PRN
  Administered 2021-06-14: 4 mg via INTRAVENOUS

## 2021-06-14 MED ORDER — ONDANSETRON HCL 4 MG/2ML IJ SOLN: 4.0000 mg | Freq: Four times a day (QID) | INTRAMUSCULAR | Status: DC | PRN

## 2021-06-14 MED ORDER — STERILE WATER FOR IRRIGATION IR SOLN
Status: DC | PRN
  Administered 2021-06-14: 500 mL

## 2021-06-14 MED ORDER — METRONIDAZOLE 500 MG/100ML IV SOLN
500.0000 mg | Freq: Two times a day (BID) | INTRAVENOUS | Status: AC
  Administered 2021-06-14 – 2021-06-21 (×12): 500 mg via INTRAVENOUS
  Filled 2021-06-14 (×12): qty 100

## 2021-06-14 MED ORDER — CALCIUM CHLORIDE 10 % IV SOLN
INTRAVENOUS | Status: AC
  Filled 2021-06-14: qty 10

## 2021-06-14 MED ORDER — SODIUM CHLORIDE 0.9 % IV SOLN: INTRAVENOUS | Status: DC | PRN

## 2021-06-14 MED ORDER — CHLORHEXIDINE GLUCONATE 0.12 % MT SOLN
OROMUCOSAL | Status: AC
  Administered 2021-06-14: 15 mL via OROMUCOSAL
  Filled 2021-06-14: qty 15

## 2021-06-14 MED ORDER — 0.9 % SODIUM CHLORIDE (POUR BTL) OPTIME
TOPICAL | Status: DC | PRN
  Administered 2021-06-14: 1000 mL

## 2021-06-14 MED ORDER — ACETAMINOPHEN 650 MG RE SUPP: 650.0000 mg | Freq: Four times a day (QID) | RECTAL | Status: DC | PRN

## 2021-06-14 MED ORDER — CHLORHEXIDINE GLUCONATE 0.12 % MT SOLN: 15.0000 mL | Freq: Once | OROMUCOSAL | Status: AC

## 2021-06-14 MED ORDER — FLUCONAZOLE IN SODIUM CHLORIDE 400-0.9 MG/200ML-% IV SOLN
400.0000 mg | INTRAVENOUS | Status: AC
  Administered 2021-06-14: 400 mg via INTRAVENOUS
  Filled 2021-06-14: qty 200

## 2021-06-14 MED ORDER — FENTANYL CITRATE (PF) 100 MCG/2ML IJ SOLN
INTRAMUSCULAR | Status: DC | PRN
  Administered 2021-06-14: 50 ug via INTRAVENOUS

## 2021-06-14 MED ORDER — CARVEDILOL 25 MG PO TABS
37.5000 mg | ORAL_TABLET | Freq: Two times a day (BID) | ORAL | Status: DC
  Administered 2021-06-14 – 2021-06-25 (×22): 37.5 mg via ORAL
  Filled 2021-06-14 (×2): qty 1
  Filled 2021-06-14: qty 3
  Filled 2021-06-14 (×3): qty 1
  Filled 2021-06-14: qty 3
  Filled 2021-06-14 (×7): qty 1
  Filled 2021-06-14: qty 3
  Filled 2021-06-14 (×10): qty 1

## 2021-06-14 MED ORDER — SODIUM CHLORIDE 0.9% FLUSH
3.0000 mL | Freq: Two times a day (BID) | INTRAVENOUS | Status: DC
  Administered 2021-06-14 – 2021-06-22 (×9): 3 mL via INTRAVENOUS

## 2021-06-14 MED ORDER — BUPIVACAINE HCL (PF) 0.25 % IJ SOLN
INTRAMUSCULAR | Status: DC | PRN
  Administered 2021-06-14: 30 mL

## 2021-06-14 MED ORDER — CALCIUM CHLORIDE 10 % IV SOLN
INTRAVENOUS | Status: DC | PRN
  Administered 2021-06-14: 200 mg via INTRAVENOUS

## 2021-06-14 MED ORDER — DEXAMETHASONE SODIUM PHOSPHATE 10 MG/ML IJ SOLN
INTRAMUSCULAR | Status: DC | PRN
  Administered 2021-06-14: 5 mg via INTRAVENOUS

## 2021-06-14 MED ORDER — ONDANSETRON HCL 4 MG/2ML IJ SOLN
INTRAMUSCULAR | Status: AC
  Filled 2021-06-14: qty 2

## 2021-06-14 MED ORDER — FENTANYL CITRATE (PF) 100 MCG/2ML IJ SOLN
INTRAMUSCULAR | Status: AC
  Filled 2021-06-14: qty 2

## 2021-06-14 MED ORDER — HYDROMORPHONE HCL 1 MG/ML IJ SOLN
1.0000 mg | INTRAMUSCULAR | Status: DC | PRN
  Administered 2021-06-14 – 2021-06-22 (×17): 1 mg via INTRAVENOUS
  Filled 2021-06-14 (×18): qty 1

## 2021-06-14 MED ORDER — SODIUM CHLORIDE 0.9 % IV SOLN
2.0000 g | INTRAVENOUS | Status: DC
  Administered 2021-06-15 – 2021-06-27 (×6): 2 g via INTRAVENOUS
  Filled 2021-06-14 (×6): qty 2

## 2021-06-14 MED ORDER — LIDOCAINE HCL (CARDIAC) PF 100 MG/5ML IV SOSY
PREFILLED_SYRINGE | INTRAVENOUS | Status: DC | PRN
  Administered 2021-06-14: 100 mg via INTRAVENOUS

## 2021-06-14 MED ORDER — ATORVASTATIN CALCIUM 40 MG PO TABS
40.0000 mg | ORAL_TABLET | Freq: Every day | ORAL | Status: DC
  Administered 2021-06-15 – 2021-06-29 (×15): 40 mg via ORAL
  Filled 2021-06-14 (×15): qty 1

## 2021-06-14 MED ORDER — INSULIN GLARGINE-YFGN 100 UNIT/ML ~~LOC~~ SOLN
30.0000 [IU] | Freq: Every day | SUBCUTANEOUS | Status: DC
  Administered 2021-06-14: 30 [IU] via SUBCUTANEOUS
  Filled 2021-06-14 (×3): qty 0.3

## 2021-06-14 MED ORDER — PROPOFOL 10 MG/ML IV BOLUS
INTRAVENOUS | Status: AC
  Filled 2021-06-14: qty 20

## 2021-06-14 MED ORDER — ORAL CARE MOUTH RINSE: 15.0000 mL | Freq: Once | OROMUCOSAL | Status: AC

## 2021-06-14 MED ORDER — KETAMINE HCL 50 MG/5ML IJ SOSY
PREFILLED_SYRINGE | INTRAMUSCULAR | Status: AC
  Filled 2021-06-14: qty 5

## 2021-06-14 MED ORDER — ONDANSETRON HCL 4 MG PO TABS: 4.0000 mg | ORAL_TABLET | Freq: Four times a day (QID) | ORAL | Status: DC | PRN

## 2021-06-14 MED ORDER — MIDAZOLAM HCL 2 MG/2ML IJ SOLN
INTRAMUSCULAR | Status: AC
  Filled 2021-06-14: qty 2

## 2021-06-14 MED ORDER — PHENYLEPHRINE 40 MCG/ML (10ML) SYRINGE FOR IV PUSH (FOR BLOOD PRESSURE SUPPORT)
PREFILLED_SYRINGE | INTRAVENOUS | Status: AC
  Filled 2021-06-14: qty 10

## 2021-06-14 MED ORDER — SODIUM CHLORIDE 0.9 % IV SOLN: 2.0000 g | INTRAVENOUS | Status: DC

## 2021-06-14 MED ORDER — INSULIN ASPART 100 UNIT/ML IJ SOLN
0.0000 [IU] | INTRAMUSCULAR | Status: DC
  Administered 2021-06-14 (×2): 2 [IU] via SUBCUTANEOUS
  Administered 2021-06-14: 5 [IU] via SUBCUTANEOUS
  Administered 2021-06-14: 3 [IU] via SUBCUTANEOUS
  Administered 2021-06-14: 9 [IU] via SUBCUTANEOUS
  Administered 2021-06-15: 5 [IU] via SUBCUTANEOUS
  Administered 2021-06-15 (×2): 7 [IU] via SUBCUTANEOUS
  Administered 2021-06-15: 3 [IU] via SUBCUTANEOUS
  Administered 2021-06-15: 2 [IU] via SUBCUTANEOUS
  Administered 2021-06-15: 5 [IU] via SUBCUTANEOUS
  Administered 2021-06-16 (×3): 2 [IU] via SUBCUTANEOUS
  Administered 2021-06-17: 1 [IU] via SUBCUTANEOUS
  Administered 2021-06-17 – 2021-06-18 (×2): 2 [IU] via SUBCUTANEOUS
  Administered 2021-06-18: 3 [IU] via SUBCUTANEOUS
  Administered 2021-06-18: 5 [IU] via SUBCUTANEOUS
  Administered 2021-06-19: 1 [IU] via SUBCUTANEOUS
  Administered 2021-06-20: 5 [IU] via SUBCUTANEOUS
  Administered 2021-06-21: 3 [IU] via SUBCUTANEOUS
  Administered 2021-06-21 (×2): 5 [IU] via SUBCUTANEOUS
  Administered 2021-06-21: 7 [IU] via SUBCUTANEOUS
  Administered 2021-06-21: 3 [IU] via SUBCUTANEOUS
  Administered 2021-06-22: 5 [IU] via SUBCUTANEOUS
  Administered 2021-06-22: 3 [IU] via SUBCUTANEOUS
  Administered 2021-06-22 (×2): 5 [IU] via SUBCUTANEOUS
  Administered 2021-06-22 (×2): 2 [IU] via SUBCUTANEOUS
  Administered 2021-06-22: 1 [IU] via SUBCUTANEOUS
  Administered 2021-06-23: 15 [IU] via SUBCUTANEOUS
  Administered 2021-06-23: 5 [IU] via SUBCUTANEOUS
  Administered 2021-06-23: 2 [IU] via SUBCUTANEOUS

## 2021-06-14 MED ORDER — PHENYLEPHRINE HCL-NACL 20-0.9 MG/250ML-% IV SOLN
INTRAVENOUS | Status: AC
  Filled 2021-06-14: qty 250

## 2021-06-14 SURGICAL SUPPLY — 41 items
ADH SKN CLS APL DERMABOND .7 (GAUZE/BANDAGES/DRESSINGS)
BAG COUNTER SPONGE SURGICOUNT (BAG) IMPLANT
BAG SPNG CNTER NS LX DISP (BAG)
BLADE CLIPPER SURG (BLADE) ×2 IMPLANT
BLADE HEX COATED 2.75 (ELECTRODE) ×1 IMPLANT
BNDG GAUZE ELAST 4 BULKY (GAUZE/BANDAGES/DRESSINGS) ×1 IMPLANT
COVER SURGICAL LIGHT HANDLE (MISCELLANEOUS) ×3 IMPLANT
DERMABOND ADVANCED (GAUZE/BANDAGES/DRESSINGS)
DERMABOND ADVANCED .7 DNX12 (GAUZE/BANDAGES/DRESSINGS) ×1 IMPLANT
DRAIN JACKSON PRATT 10MM FLAT (MISCELLANEOUS) ×1 IMPLANT
DRAIN JACKSON PRT FLT 10 (DRAIN) ×1 IMPLANT
DRAIN PENROSE 0.25X18 (DRAIN) IMPLANT
DRAPE LAPAROTOMY 100X72 PEDS (DRAPES) ×2 IMPLANT
DRSG ADAPTIC 3X8 NADH LF (GAUZE/BANDAGES/DRESSINGS) ×1 IMPLANT
DRSG TEGADERM 4X4.75 (GAUZE/BANDAGES/DRESSINGS) ×3 IMPLANT
ELECT REM PT RETURN 15FT ADLT (MISCELLANEOUS) ×2 IMPLANT
EVACUATOR SILICONE 100CC (DRAIN) ×2 IMPLANT
GAUZE SPONGE 4X4 12PLY STRL LF (GAUZE/BANDAGES/DRESSINGS) ×1 IMPLANT
GLOVE SURG ENC MOIS LTX SZ7.5 (GLOVE) ×4 IMPLANT
GLOVE SURG POLYISO LF SZ6.5 (GLOVE) ×2 IMPLANT
GLOVE SURG UNDER POLY LF SZ6.5 (GLOVE) ×1 IMPLANT
GOWN STRL REUS W/ TWL LRG LVL3 (GOWN DISPOSABLE) ×1 IMPLANT
GOWN STRL REUS W/TWL LRG LVL3 (GOWN DISPOSABLE) ×6
KIT BASIN OR (CUSTOM PROCEDURE TRAY) ×2 IMPLANT
NEEDLE HYPO 22GX1.5 SAFETY (NEEDLE) ×1 IMPLANT
NS IRRIG 1000ML POUR BTL (IV SOLUTION) ×2 IMPLANT
PACK GENERAL/GYN (CUSTOM PROCEDURE TRAY) ×2 IMPLANT
SOL PREP POV-IOD 4OZ 10% (MISCELLANEOUS) ×2 IMPLANT
SPONGE T-LAP 18X18 ~~LOC~~+RFID (SPONGE) ×1 IMPLANT
STAPLER VISISTAT 35W (STAPLE) ×1 IMPLANT
SUPPORT SCROTAL LG STRP (MISCELLANEOUS) ×2 IMPLANT
SUT CHROMIC 3 0 SH 27 (SUTURE) ×2 IMPLANT
SUT ETHILON 2 0 PSLX (SUTURE) ×2 IMPLANT
SUT VIC AB 2-0 UR5 27 (SUTURE) ×2 IMPLANT
SUT VICRYL 0 TIES 12 18 (SUTURE) IMPLANT
SWAB COLLECTION DEVICE MRSA (MISCELLANEOUS) ×1 IMPLANT
SWAB CULTURE ESWAB REG 1ML (MISCELLANEOUS) ×1 IMPLANT
SYR CONTROL 10ML LL (SYRINGE) IMPLANT
TOWEL GREEN STERILE (TOWEL DISPOSABLE) ×4 IMPLANT
TRAY FOLEY SLVR 16FR LF STAT (SET/KITS/TRAYS/PACK) ×1 IMPLANT
WATER STERILE IRR 1000ML POUR (IV SOLUTION) ×2 IMPLANT

## 2021-06-14 NOTE — Anesthesia Postprocedure Evaluation (Signed)
Anesthesia Post Note  Patient: [name]  Procedure(s) Performed: REMOVAL OF PENILE PROSTHESIS     Patient location during evaluation: PACU Anesthesia Type: General Level of consciousness: awake Pain management: pain level controlled Vital Signs Assessment: post-procedure vital signs reviewed and stable Respiratory status: spontaneous breathing Cardiovascular status: stable Postop Assessment: no apparent nausea or vomiting Anesthetic complications: no   No notable events documented.  Last Vitals:  Vitals:   06/14/21 2140 06/14/21 2145  BP: 125/70   Pulse: 87 (!) 108  Resp: 16 15  Temp:    SpO2: 100% 96%    Last Pain:  Vitals:   06/14/21 2135  TempSrc:   PainSc: 10-Worst pain ever                 Tariya Morrissette

## 2021-06-14 NOTE — Consult Note (Signed)
[facility] Renal Consultation Note    Indication for Consultation:  Management of ESRD/hemodialysis, anemia, hypertension/volume, and secondary hyperparathyroidism. PCP:  HPI: [name] is a 65 y.o. male with ESRD, T2DM, COPD, CAD s/p PCI, PAD (s/p R BKA and L 4th/5th toe amputations), and A-fib (on Eliquis) who was admitted with sepsis.  Brought to ED via EMS from dialysis with fever of 102 and rigors. He had been feeling poorly for the past 2 weeks, but slowly worsening. Has had L foot ulcer which has been ongoing for over 1 year - will heal then recur per patient - now with two areas of concern. Per notes, was being considered for L BKA and plan for LE arteriogram first. Also, he had been experiencing penile pain and swelling with concern for protrusion of his penile implant thru his urethra. He had an upcoming urology appt scheduled. Denied dyspnea, nausea, vomiting, diarrhea. Endorsed mild chest pain and abdominal pain which he thinks is radiating up from his groin.  In ED - initially with hypotensive, febrile, and tachycardic - improved with IVF bolus. Labs with WBC 21.7, Hgb 12.1, K 4.1, LA 3.5 -> 3.9. COVID/Flu negative. CXR clear. Foot xray with possible osteomyelitis. CT abdomen showed concern for soft tissue infection around his penile prosthesis. Urology consulted - plan is for removal of implant today and IV antibiotics/antifungal. Blood Cx collected and started on IV Vancomycin, Cefepine, Flagyl, and Fluconazole.  Today - evaluated in ED, room pending. Non-toxic appearing. Still with some abdominal pain and scrotal/penile pain. Afebrile today. No N/V/D.  Dialyzes on TTS schedule at [facility] - s/p full HD yesterday prior to ED evaluation.  Past Medical History:  Diagnosis Date   Carotid artery occlusion 11/10/10   LEFT CAROTID ENDARTERECTOMY   Chronic kidney disease    Complication of anesthesia    BP WENT UP AT [facility] "   COPD (chronic obstructive pulmonary disease)  ([place])    pt denies this dx as of 06/01/20 - no inhaler    Diabetes mellitus without complication ([place])    Diverticulitis    Diverticulosis of colon (without mention of hemorrhage)    DJD (degenerative joint disease)    knees/hands/feet/back/neck   Fatty liver    Full dentures    GERD (gastroesophageal reflux disease)    H/O hiatal hernia    History of blood transfusion    with a past surical procedure per patient 06/01/20   Hyperlipidemia    Hypertension    Neuromuscular disorder ([place])    peripheral neuropathy   Non-pressure chronic ulcer of other part of left foot limited to breakdown of skin ([place]) 11/12/2016   Osteomyelitis (HCC)    left 5th metatarsal   PAD (peripheral artery disease) ([place])    Distal aortogram June 2012. Atherectomy left popliteal artery July 2012.    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   Unstable angina ([place]) 09/16/2018   Wears glasses    Past Surgical History:  Procedure Laterality Date   AMPUTATION  11/05/2011   Procedure: AMPUTATION RAY;  Surgeon: Wylene Simmer, MD;  Location: [place];  Service: Orthopedics;  Laterality: Right;  Amputation of Right 4&5th Toes   AMPUTATION Left 11/26/2012   Procedure: AMPUTATION RAY;  Surgeon: Wylene Simmer, MD;  Location: [place];  Service: Orthopedics;  Laterality: Left;  fourth ray  amputation   AMPUTATION Right 08/27/2014   Procedure: Transmetatarsal Amputation;  Surgeon: Newt Minion, MD;  Location: [place];  Service: Orthopedics;  Laterality: Right;   AMPUTATION Right 01/14/2015   Procedure: AMPUTATION BELOW KNEE;  Surgeon: Newt Minion, MD;  Location: [place];  Service: Orthopedics;  Laterality: Right;   AMPUTATION Left 10/21/2015   Procedure: Left Foot 5th Ray Amputation;  Surgeon: Newt Minion, MD;  Location: [place];  Service: Orthopedics;  Laterality: Left;   ANTERIOR FUSION CERVICAL SPINE  02/06/06   C4-5,  C5-6, C6-7; SURGEON DR. MAX COHEN   AV FISTULA PLACEMENT Left 06/02/2020   Procedure: ARTERIOVENOUS (AV) FISTULA CREATION LEFT;  Surgeon: Waynetta Sandy, MD;  Location: [place];  Service: Vascular;  Laterality: Left;   BACK SURGERY     x 3   [place] Left 07/21/2020   Procedure: LEFT UPPER ARM ATERIOVENOUS SUPERFISTULALIZATION;  Surgeon: Waynetta Sandy, MD;  Location: [place];  Service: Vascular;  Laterality: Left;   BELOW KNEE LEG AMPUTATION Right    CARDIAC CATHETERIZATION  10/31/04   2009   CAROTID ENDARTERECTOMY  11/10/10   CAROTID ENDARTERECTOMY Left 11/10/2010   Subtotal occlusion of left internal carotid artery with left hemispheric transient ischemic attacks.   CAROTID STENT     CARPAL TUNNEL RELEASE Right 10/21/2013   Procedure: RIGHT CARPAL TUNNEL RELEASE;  Surgeon: Wynonia Sours, MD;  Location: [place];  Service: Orthopedics;  Laterality: Right;   CHOLECYSTECTOMY     COLON SURGERY     COLONOSCOPY     COLOSTOMY REVERSAL  05/21/2018   ileostomy reversal   CYSTOSCOPY WITH STENT PLACEMENT Bilateral 01/13/2018   Procedure: CYSTOSCOPY WITH BILATERAL URETERAL CATHETER PLACEMENT;  Surgeon: Ardis Hughs, MD;  Location: [facility];  Service: Urology;  Laterality: Bilateral;   ESOPHAGEAL MANOMETRY Bilateral 07/19/2014   Procedure: ESOPHAGEAL MANOMETRY (EM);  Surgeon: Jerene Bears, MD;  [facility];  Service: Gastroenterology;  Laterality: Bilateral;   EYE SURGERY Bilateral 2020   cataract   FEMORAL ARTERY STENT     x6   FINGER SURGERY     FOOT SURGERY  04/25/2016    EXCISION BASE 5TH METATARSAL AND PARTIAL CUBOID LEFT FOOT   HERNIA REPAIR     LEFT INGUINAL AND UMBILICAL REPAIRS   HERNIA REPAIR     I & D EXTREMITY Left 04/25/2016   Procedure: EXCISION BASE 5TH METATARSAL AND PARTIAL CUBOID LEFT FOOT;  Surgeon: Newt Minion, MD;  Location: [place];  Service: Orthopedics;  Laterality: Left;   ILEOSTOMY  01/13/2018   Procedure:  ILEOSTOMY;  Surgeon: Clovis Riley, MD;  Location[facility];  Service: General;;   ILEOSTOMY CLOSURE N/A 05/21/2018   Procedure: ILEOSTOMY REVERSAL ERAS PATHWAY;  Surgeon: Clovis Riley, MD;  Location: [place];  Service: General;  Laterality: N/A;   IR RADIOLOGIST EVAL & MGMT  11/19/2017   IR RADIOLOGIST EVAL & MGMT  12/03/2017   IR RADIOLOGIST EVAL & MGMT  12/18/2017   JOINT REPLACEMENT Right 2001   Total knee   LAMINECTOMY     X 3 LUMBAR AND X 2 CERVICAL SPINE OPERATIONS   LAPAROSCOPIC CHOLECYSTECTOMY W/ CHOLANGIOGRAPHY  11/09/04   SURGEON DR. [place] CATH AND CORONARY ANGIOGRAPHY N/A 09/16/2018   Procedure: LEFT HEART CATH AND CORONARY ANGIOGRAPHY;  Surgeon: Nigel Mormon, MD;  Location: [place] [facility];  Service: Cardiovascular;  Laterality: N/A;   LEFT  HEART CATHETERIZATION WITH CORONARY ANGIOGRAM N/A 10/29/2014   Procedure: LEFT HEART CATHETERIZATION WITH CORONARY ANGIOGRAM;  Surgeon: Laverda Page, MD;  Location: [facility] CATH LAB;  Service: Cardiovascular;  Laterality: N/A;   LIGATION OF COMPETING BRANCHES OF ARTERIOVENOUS FISTULA Left 07/21/2020   Procedure: LIGATION OF COMPETING BRANCHES OF LEFT UPPER ARM ARTERIOVENOUS FISTULA;  Surgeon: Waynetta Sandy, MD;  Location: [place];  Service: Vascular;  Laterality: Left;   LOWER EXTREMITY ANGIOGRAM N/A 03/19/2012   Procedure: LOWER EXTREMITY ANGIOGRAM;  Surgeon: Burnell Blanks, MD;  Location: [facility] CATH LAB;  Service: Cardiovascular;  Laterality: N/A;   NECK SURGERY     PARTIAL COLECTOMY N/A 01/13/2018   Procedure: LAPAROSCOPIC ASSISTED   SIGMOID COLECTOMY ILEOSTOMY;  Surgeon: Clovis Riley, MD;  [facility];  Service: General;  Laterality: N/A;   PENILE PROSTHESIS IMPLANT  08/14/05   INFRAPUBIC INSERTION OF INFLATABLE PENILE PROSTHESIS; SURGEON DR. Amalia Hailey   PENILE PROSTHESIS IMPLANT     PERCUTANEOUS CORONARY STENT INTERVENTION (PCI-S) Right 10/29/2014   Procedure: PERCUTANEOUS CORONARY STENT  INTERVENTION (PCI-S);  Surgeon: Laverda Page, MD;  Location: [facility] CATH LAB;  Service: Cardiovascular;  Laterality: Right;   SHOULDER ARTHROSCOPY     SPINE SURGERY     TOE AMPUTATION Left    TONSILLECTOMY     TOTAL KNEE ARTHROPLASTY  07/2002   RIGHT KNEE ; SURGEON  DR. [name] ALSO HAD ARTHROSCOPIC RIGHT KNEE IN  10/2001   TOTAL KNEE ARTHROPLASTY     ULNAR NERVE TRANSPOSITION Right 10/21/2013   Procedure: RIGHT ELBOW  ULNAR NERVE DECOMPRESSION;  Surgeon: Wynonia Sours, MD;  Location: [place];  Service: Orthopedics;  Laterality: Right;   Family History  Problem Relation Age of Onset   Heart disease Father        Before age 53-  CAD, BPG   Diabetes Father        Amputation   Cancer Father        PROSTATE   Hyperlipidemia Father    Hypertension Father    Heart attack Father        Triple BPG   Varicose Veins Father    Colon cancer Brother    Diabetes Brother    Heart disease Brother 25       A-Fib. Before age 67   Hyperlipidemia Brother    Hypertension Brother    Cancer Sister        Breast   Hyperlipidemia Sister    Hypertension Sister    Hypertension Son    Arthritis Other        GRANDMOTHER   Hypertension Other        OTHER FAMILY MEMBERS   Social History:  reports that he quit smoking about 9 years ago. His smoking use included cigarettes. He has a 70.00 pack-year smoking history. He has never used smokeless tobacco. He reports that he does not currently use alcohol. He reports that he does not use drugs.  ROS: As per HPI otherwise negative.  Physical Exam: Vitals:   06/14/21 0400 06/14/21 0545 06/14/21 0700 06/14/21 0900  BP: (!) 105/53 98/70 (!) 100/41 (!) 97/59  Pulse: 76 75 (!) 50 86  Resp: 17 16 17  (!) 21  Temp:   98.8 F (37.1 C)   TempSrc:   Oral   SpO2: 94% 95% 99% 99%  Weight:      Height:         General: Well developed, well nourished, in no acute distress.  Head: Normocephalic, atraumatic, sclera non-icteric, mucus membranes are  moist. Neck: Supple without lymphadenopathy/masses. JVD not elevated. Lungs: Clear bilaterally to auscultation without wheezes, rales, or rhonchi. Breathing is unlabored. Heart: RRR with normal S1, S2. No murmurs, rubs, or gallops appreciated. Abdomen: Soft, non-tender, non-distended with normoactive bowel sounds.  GU: Penile shaft edematous and erythematous Musculoskeletal:  Strength and tone appear normal for age. Lower extremities: R BKA; L lateral foot with 2 ulcerated areas Neuro: Alert and oriented X 3. Moves all extremities spontaneously. Psych:  Responds to questions appropriately with a normal affect. Dialysis Access: L AVF + bruit  Allergies  Allergen Reactions   Contrast Media [Iodinated Diagnostic Agents]     Difficulty breathing     Ivp Dye [Iodinated Diagnostic Agents] Anaphylaxis and Other (See Comments)    Breathing problems, altered mental state    Adhesive [Tape] Rash    Rash after 3-4 days use   Prior to Admission medications   Medication Sig Start Date End Date Taking? Authorizing Provider  amLODipine (NORVASC) 5 MG tablet Take 5 mg by mouth daily. 01/30/21  Yes [provider]  apixaban (ELIQUIS) 5 MG TABS tablet Take 5 mg by mouth See admin instructions. Only take on non-dialysis days Monday,Wednesday,Friday,sunday   Yes [provider]  atorvastatin (LIPITOR) 40 MG tablet Take 40 mg by mouth at bedtime.    Yes [provider]  BIDIL 20-37.5 MG tablet Take 1 tablet by mouth See admin instructions. Take on Monday,Wednesday,Friday,sunday 04/28/20  Yes [provider]  carvedilol (COREG) 25 MG tablet Take 1.5 tablets (37.5 mg total) by mouth 2 (two) times daily. Patient taking differently: Take 12.5 mg by mouth 2 (two) times daily. 08/01/16  Yes Adrian Prows, MD  famotidine (PEPCID) 40 MG tablet Take 40 mg by mouth daily. 04/29/21  Yes [provider]  fentaNYL (DURAGESIC - DOSED MCG/HR) 50 MCG/HR Place 50 mcg onto the skin  every 3 (three) days.   Yes [provider]  folic acid-vitamin b complex-vitamin c-selenium-zinc (DIALYVITE) 3 MG TABS tablet Take 1 tablet by mouth daily.   Yes [provider]  Glucosamine HCl (GLUCOSAMINE PO) Take 100 tablets by mouth 2 (two) times daily.   Yes [provider]  HUMALOG KWIKPEN 100 UNIT/ML KiwkPen Inject 13 Units into the skin 3 (three) times daily. 11/14/17  Yes [provider]  insulin glargine (LANTUS) 100 UNIT/ML injection Inject 55 Units into the skin at bedtime.   Yes [provider]  ketoconazole (NIZORAL) 2 % cream Apply 1 application topically 2 (two) times daily as needed for irritation.  03/07/18  Yes [provider]  lidocaine-prilocaine (EMLA) cream Dialysis days Tuesday,Thursday and saturday 06/06/21  Yes [provider]  linaclotide (LINZESS) 145 MCG CAPS capsule Take 145 mcg by mouth daily as needed (constipation).    Yes [provider]  loratadine (CLARITIN) 10 MG tablet Take 10 mg by mouth daily.   Yes [provider]  NEEDLE, REUSABLE, 22 G 22G X 1-1/2" MISC 1 Units by Does not apply route as directed. For B12 IM inj 02/15/18  Yes Elgergawy, Silver Huguenin, MD  omega-3 acid ethyl esters (LOVAZA) 1 g capsule Take 2 g by mouth 2 (two) times daily.   Yes [provider]  ondansetron (ZOFRAN) 4 MG tablet Take 4 mg by mouth every 6 (six) hours as needed for nausea or vomiting.   Yes [provider]  oxyCODONE-acetaminophen (PERCOCET) 7.5-325 MG tablet Take 1 tablet by mouth 3 (three)  times daily as needed for moderate pain. 12/07/20  Yes [provider]  pantoprazole (PROTONIX) 40 MG tablet Take 40 mg by mouth daily.   Yes [provider]  Polyvinyl Alcohol-Povidone PF 1.4-0.6 % SOLN Place 1 drop into both eyes 2 (two) times daily as needed (dry eyes).    Yes [provider]  pregabalin (LYRICA) 100 MG capsule Take 1 capsule (100 mg total) by mouth 3  (three) times daily. 05/10/16  Yes Tat, Shanon Brow, MD  rOPINIRole (REQUIP) 2 MG tablet Take 2 mg by mouth at bedtime.   Yes [provider]  sulfamethoxazole-trimethoprim (BACTRIM DS) 800-160 MG tablet Take 1 tablet by mouth 2 (two) times daily. Patient taking differently: Take 1 tablet by mouth See admin instructions. Bid x 10 days 06/12/21  Yes Newt Minion, MD  Syringe/Needle, Disp, (SYRINGE 3CC/22GX1-1/2") 22G X 1-1/2" 3 ML MISC 1 Syringe by Does not apply route as directed. For b12 IM inj 02/15/18  Yes Elgergawy, Silver Huguenin, MD  tamsulosin (FLOMAX) 0.4 MG CAPS capsule Take 0.4 mg by mouth daily.   Yes [provider]  torsemide (DEMADEX) 20 MG tablet Take 20 mg by mouth in the morning and at bedtime. Take on extra for swelling   Yes [provider]  nitroGLYCERIN (NITRODUR - DOSED IN MG/24 HR) 0.2 mg/hr patch Place 1 patch (0.2 mg total) onto the skin daily. 06/12/21   Newt Minion, MD   Current Facility-Administered Medications  Medication Dose Route Frequency Provider Last Rate Last Admin   acetaminophen (TYLENOL) tablet 1,000 mg  1,000 mg Oral Q6H PRN Lenore Cordia, MD   1,000 mg at 06/14/21 1026   Or   acetaminophen (TYLENOL) suppository 650 mg  650 mg Rectal Q6H PRN Lenore Cordia, MD       atorvastatin (LIPITOR) tablet 40 mg  40 mg Oral QHS Lenore Cordia, MD       carvedilol (COREG) tablet 37.5 mg  37.5 mg Oral BID WC Zada Finders R, MD   37.5 mg at 06/14/21 0954   [START ON 06/15/2021] ceFEPIme (MAXIPIME) 2 g in sodium chloride 0.9 % 100 mL IVPB  2 g Intravenous Q T,Th,Sat-1800 von Dohlen, Haley B, RPH       [START ON 06/15/2021] fluconazole (DIFLUCAN) IVPB 200 mg  200 mg Intravenous QHS Franky Macho, RPH       HYDROmorphone (DILAUDID) injection 1 mg  1 mg Intravenous Q4H PRN Zada Finders R, MD   1 mg at 06/14/21 0827   insulin aspart (novoLOG) injection 0-9 Units  0-9 Units Subcutaneous Q4H Lenore Cordia, MD   3 Units at 06/14/21 0820   insulin  glargine-yfgn (SEMGLEE) injection 30 Units  30 Units Subcutaneous QHS Lenore Cordia, MD   30 Units at 06/14/21 0201   metroNIDAZOLE (FLAGYL) IVPB 500 mg  500 mg Intravenous Q12H Lenore Cordia, MD   Stopped at 06/14/21 1108   ondansetron (ZOFRAN) tablet 4 mg  4 mg Oral Q6H PRN Lenore Cordia, MD       Or   ondansetron (ZOFRAN) injection 4 mg  4 mg Intravenous Q6H PRN Zada Finders R, MD       sodium chloride flush (NS) 0.9 % injection 3 mL  3 mL Intravenous Q12H Zada Finders R, MD   3 mL at 06/14/21 1027   [START ON 06/15/2021] vancomycin (VANCOCIN) IVPB 1000 mg/200 mL premix  1,000 mg Intravenous Q T,Th,Sa-HD Franky Macho, [facility]  Current Outpatient Medications  Medication Sig Dispense Refill   amLODipine (NORVASC) 5 MG tablet Take 5 mg by mouth daily.     apixaban (ELIQUIS) 5 MG TABS tablet Take 5 mg by mouth See admin instructions. Only take on non-dialysis days Monday,Wednesday,Friday,sunday     atorvastatin (LIPITOR) 40 MG tablet Take 40 mg by mouth at bedtime.      BIDIL 20-37.5 MG tablet Take 1 tablet by mouth See admin instructions. Take on Monday,Wednesday,Friday,sunday     carvedilol (COREG) 25 MG tablet Take 1.5 tablets (37.5 mg total) by mouth 2 (two) times daily. (Patient taking differently: Take 12.5 mg by mouth 2 (two) times daily.) 270 tablet 2   famotidine (PEPCID) 40 MG tablet Take 40 mg by mouth daily.     fentaNYL (DURAGESIC - DOSED MCG/HR) 50 MCG/HR Place 50 mcg onto the skin every 3 (three) days.     folic acid-vitamin b complex-vitamin c-selenium-zinc (DIALYVITE) 3 MG TABS tablet Take 1 tablet by mouth daily.     Glucosamine HCl (GLUCOSAMINE PO) Take 100 tablets by mouth 2 (two) times daily.     HUMALOG KWIKPEN 100 UNIT/ML KiwkPen Inject 13 Units into the skin 3 (three) times daily.  0   insulin glargine (LANTUS) 100 UNIT/ML injection Inject 55 Units into the skin at bedtime.     ketoconazole (NIZORAL) 2 % cream Apply 1 application topically 2 (two) times daily as  needed for irritation.   1   lidocaine-prilocaine (EMLA) cream Dialysis days Tuesday,Thursday and saturday     linaclotide (LINZESS) 145 MCG CAPS capsule Take 145 mcg by mouth daily as needed (constipation).      loratadine (CLARITIN) 10 MG tablet Take 10 mg by mouth daily.     NEEDLE, REUSABLE, 22 G 22G X 1-1/2" MISC 1 Units by Does not apply route as directed. For B12 IM inj 10 each 0   omega-3 acid ethyl esters (LOVAZA) 1 g capsule Take 2 g by mouth 2 (two) times daily.     ondansetron (ZOFRAN) 4 MG tablet Take 4 mg by mouth every 6 (six) hours as needed for nausea or vomiting.     oxyCODONE-acetaminophen (PERCOCET) 7.5-325 MG tablet Take 1 tablet by mouth 3 (three) times daily as needed for moderate pain.     pantoprazole (PROTONIX) 40 MG tablet Take 40 mg by mouth daily.     Polyvinyl Alcohol-Povidone PF 1.4-0.6 % SOLN Place 1 drop into both eyes 2 (two) times daily as needed (dry eyes).      pregabalin (LYRICA) 100 MG capsule Take 1 capsule (100 mg total) by mouth 3 (three) times daily. 30 capsule 0   rOPINIRole (REQUIP) 2 MG tablet Take 2 mg by mouth at bedtime.     sulfamethoxazole-trimethoprim (BACTRIM DS) 800-160 MG tablet Take 1 tablet by mouth 2 (two) times daily. (Patient taking differently: Take 1 tablet by mouth See admin instructions. Bid x 10 days) 20 tablet 0   Syringe/Needle, Disp, (SYRINGE 3CC/22GX1-1/2") 22G X 1-1/2" 3 ML MISC 1 Syringe by Does not apply route as directed. For b12 IM inj 10 each 0   tamsulosin (FLOMAX) 0.4 MG CAPS capsule Take 0.4 mg by mouth daily.     torsemide (DEMADEX) 20 MG tablet Take 20 mg by mouth in the morning and at bedtime. Take on extra for swelling     nitroGLYCERIN (NITRODUR - DOSED IN MG/24 HR) 0.2 mg/hr patch Place 1 patch (0.2 mg total) onto the skin daily. 30 patch 12  Labs: Basic Metabolic Panel: Recent Labs  Lab 06/13/21 2017 06/14/21 0213  NA 136 133*  K 4.1 4.1  CL 91* 94*  CO2 26 27  GLUCOSE 336* 392*  BUN 18 24*  CREATININE  4.23* 4.28*  CALCIUM 8.7* 7.8*   Liver Function Tests: Recent Labs  Lab 06/13/21 2017  AST 36  ALT 22  ALKPHOS 123  BILITOT 1.1  PROT 6.8  ALBUMIN 2.7*   CBC: Recent Labs  Lab 06/13/21 2017 06/14/21 0213  WBC 21.7* 15.2*  NEUTROABS 19.5*  --   HGB 12.1* 10.1*  HCT 36.8* 30.6*  MCV 91.3 93.0  PLT 358 241   CBG: Recent Labs  Lab 06/14/21 0155 06/14/21 0419 06/14/21 0750  GLUCAP 355* 284* 216*   Studies/Results: CT ABDOMEN PELVIS WO CONTRAST  Result Date: 06/13/2021 CLINICAL DATA:  Inflammatory change in the perineum and scrotum with elevated white blood cell count concerning for possible Fournier's gangrene. Patient states a sensation of the penile prosthesis abutting the tip of the penis. EXAM: CT ABDOMEN AND PELVIS WITHOUT CONTRAST TECHNIQUE: Multidetector CT imaging of the abdomen and pelvis was performed following the standard protocol without IV contrast. COMPARISON:  06/19/2019 FINDINGS: Lower chest: Lung bases demonstrate minimal atelectatic changes on the left. No focal effusion or infiltrate is seen. Hepatobiliary: No focal liver abnormality is seen. Status post cholecystectomy. No biliary dilatation. Pancreas: Unremarkable. No pancreatic ductal dilatation or surrounding inflammatory changes. Spleen: Normal in size without focal abnormality. Adrenals/Urinary Tract: Adrenal glands are within normal limits. Stomach/Bowel: Kidneys are well visualized bilaterally without renal calculi or obstructive changes. The bladder is partially distended. Vascular/Lymphatic: No obstructive or inflammatory changes of the colon are seen. The appendix is within normal limits. Postsurgical changes in the sigmoid are noted. Small bowel and stomach are unremarkable. Reproductive: Prostate appears within normal limits. There are findings of a penile prosthesis identified. The reservoir in the pelvis appears within normal limits although there is significant Pericatheter inflammatory change  surrounding the catheter extending to the pump. The penile portion of the prosthesis extends to the penis tip although no erosive changes are identified. Some inflammatory change is noted surrounding the squeeze pump portion of the prosthesis and extending into the scrotum although no definitive fluid collection is seen. Some calcifications associated with the testicles are noted likely chronic in nature. Other: No free fluid is noted.  No hernia is seen. Musculoskeletal: Postsurgical changes in the lower lumbar spine are noted. Degenerative changes are seen as well. No acute bony abnormality is noted. IMPRESSION: Changes in the scrotum and prepubic soft tissues consistent with localized infection surrounding the catheter and squeeze pump of the penile prosthesis. No definitive abscess is seen at this time. The penile component of the prosthesis does not appear to erode through the skin. No other focal abnormality is noted. Critical Value/emergent results were called by telephone at the time of interpretation on 06/13/2021 at 9:30 pm to Dr. Marda Stalker , who verbally acknowledged these results. Electronically Signed   By: Inez Catalina M.D.   On: 06/13/2021 21:39   DG Chest Port 1 View  Result Date: 06/13/2021 CLINICAL DATA:  Wound infection. EXAM: PORTABLE CHEST 1 VIEW COMPARISON:  Chest x-ray dated November 18, 2020. FINDINGS: The heart size and mediastinal contours are within normal limits. Both lungs are clear. The visualized skeletal structures are unremarkable. IMPRESSION: No active disease. Electronically Signed   By: Titus Dubin M.D.   On: 06/13/2021 20:42   DG Foot 2 Views Left  Result Date: 06/13/2021 CLINICAL DATA:  Wound infection EXAM: LEFT FOOT - 2 VIEW COMPARISON:  10/11/2016 FINDINGS: Bones appear demineralized. There are vascular calcifications. Patient is status post amputation of the fifth digit and partial amputation fourth digit at the level of the MTP joint. Presumed wound  lateral aspect of the distal foot. Possible osteopenia and cortical erosive change at the head of the fourth metatarsal. IMPRESSION: Osteopenia with questionable erosive change/osteomyelitis at the head of the fourth metatarsal. Consider correlation with MRI. Electronically Signed   By: Donavan Foil M.D.   On: 06/13/2021 20:43    Dialysis Orders:  TTS at [facility] 4:15hr, 450/800, EDW 108kg, 2K/2Ca, AVF, no heparin - Mircera 148mcg IV q 2 wks (last 9/27) - Hectoral 35mcg IV q HD  Assessment/Plan:  Sepsis: Source either foot or implant infection (or both). Blood Cx 10/4 pending. On Vanc/Cefepime/Flagyl + IV Fluconazole.   L foot infection: Xray concerning for osteomyelitis - on IV abx.   Penile prothesis infection: For removal with urology today.  ESRD:  Continue HD per usual TTS schedule -> next tomorrow.  Hypertension/volume: BP low sided, no significant edema on exam.  Anemia: Hgb 10.1 - not due for ESA yet  Metabolic bone disease: Ca ok, Phos pending. Continue home binders  Nutrition:  Alb low, starting protein supplements.  T2DM  PAD  CAD  Veneta Penton, PA-C 06/14/2021, 11:36 AM  [facility]

## 2021-06-14 NOTE — Progress Notes (Signed)
Surgery posted for this afternoon as an [email or web address].  Plan for removal of 3 piece IPP

## 2021-06-14 NOTE — H&P (Signed)
History and Physical    [name] ZOX:[id number] DOB: February 10, 1956 DOA: 06/13/2021  PCP: [name], PA-C  Patient coming from: Dialysis center via EMS  I have personally briefly reviewed patient's old medical records in [place]  Chief Complaint: Wound infection  HPI: [name] is a 65 y.o. male with medical history significant for severe PAD (s/p right BKA, toe amputations on the left, multiple peripheral interventions) with new left foot gangrenous wound, ESRD on TTS HD, CAD s/p PCI, paroxysmal atrial flutter/fibrillation on Eliquis, chronic diastolic CHF, [id number] with neuropathy, HTN, HLD, chronic back/neck pain on chronic pain medication who presented to the ED from his dialysis center for evaluation of wound infection.  Patient reports several months of progressive left lateral foot wound ulcer for which she has been following with orthopedics, [name].  Has felt that he had been healing fairly well however when seen on follow-up on 10/3 he was noted to have a new gangrenous ulcer over the base of the lateral left foot.  There was discussion about potential need for transtibial amputation.  Patient states that over the last couple weeks he has also noticed increasing swelling and pain to his scrotal and penile area.  He reports seeing purulent discharge from his urethra as well as apparent protrusion of his penile prosthesis.  He also saw his cardiologist on 10/3 who diagnosed him with atypical atrial flutter.  He had been asymptomatic from A. fib/flutter standpoint therefore cardioversion was deferred.  He went to his usual Tuesday dialysis session and afterwards was advised to present to the ED for evaluation of wound infection.  Patient does report chills, diaphoresis, occasional palpitations, and one episode of nausea with emesis.  He denies any significant chest pain.  He states that he does still make urine.  He reports alternating constipation and loose  stools.  ED Course:  Initial vitals showed BP 106/63, pulse 150, RR 20, temp 101.1 F, SPO2 99% on room air.  T-max of 102 F.  While in the ED patient was tachypneic with RR up to 24.  Labs showed WBC 21.7, hemoglobin 12.1, platelets 358,000, sodium 136, potassium 4.1, bicarb 26, BUN 18, creatinine 4.23, serum glucose 336, AST 36, ALT 22, alkaline phosphatase 123, total bilirubin 1.1, lactic acid 3.5 > 3.9, INR 1.5.  SARS-CoV-2 and influenza PCR's are negative.  Blood cultures collected and pending.  Left foot x-ray showed osteopenia with questionable erosive change/osteomyelitis at the head of the fourth metatarsal.  Portable chest x-ray was negative for focal consolidation, edema, or effusion.  CT abdomen/pelvis without contrast showed changes in the scrotum and prepubic soft tissues consistent with localized infection surrounding the catheter and squeeze pump of the penile prosthesis without definite abscess seen.  No other focal abnormality noted.  Patient received 3.5 L LR.  He was started on IV vancomycin, Flagyl, cefepime.  Urology were consulted and recommended medical admission, continuing broad-spectrum antibiotics with addition of antifungal, and plan on explantation of penile prosthesis potentially tomorrow evening.  The hospitalist service was consulted to admit for further evaluation and management.  Review of Systems: All systems reviewed and are negative except as documented in history of present illness above.   Past Medical History:  Diagnosis Date   Carotid artery occlusion 11/10/10   LEFT CAROTID ENDARTERECTOMY   Chronic kidney disease    Complication of anesthesia    BP WENT UP AT [facility] "   COPD (chronic obstructive pulmonary disease) ([place])    pt  denies this dx as of 06/01/20 - no inhaler    Diabetes mellitus without complication ([place])    Diverticulitis    Diverticulosis of colon (without mention of hemorrhage)    DJD (degenerative joint disease)     knees/hands/feet/back/neck   Fatty liver    Full dentures    GERD (gastroesophageal reflux disease)    H/O hiatal hernia    History of blood transfusion    with a past surical procedure per patient 06/01/20   Hyperlipidemia    Hypertension    Neuromuscular disorder ([place])    peripheral neuropathy   Non-pressure chronic ulcer of other part of left foot limited to breakdown of skin ([place]) 11/12/2016   Osteomyelitis (HCC)    left 5th metatarsal   PAD (peripheral artery disease) ([place])    Distal aortogram June 2012. Atherectomy left popliteal artery July 2012.    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   Unstable angina ([place]) 09/16/2018   Wears glasses     Past Surgical History:  Procedure Laterality Date   AMPUTATION  11/05/2011   Procedure: AMPUTATION RAY;  Surgeon: Wylene Simmer, MD;  Location: [place];  Service: Orthopedics;  Laterality: Right;  Amputation of Right 4&5th Toes   AMPUTATION Left 11/26/2012   Procedure: AMPUTATION RAY;  Surgeon: Wylene Simmer, MD;  Location: [facility];  Service: Orthopedics;  Laterality: Left;  fourth ray amputation   AMPUTATION Right 08/27/2014   Procedure: Transmetatarsal Amputation;  Surgeon: Newt Minion, MD;  Location: [place];  Service: Orthopedics;  Laterality: Right;   AMPUTATION Right 01/14/2015   Procedure: AMPUTATION BELOW KNEE;  Surgeon: Newt Minion, MD;  Location: [place];  Service: Orthopedics;  Laterality: Right;   AMPUTATION Left 10/21/2015   Procedure: Left Foot 5th Ray Amputation;  Surgeon: Newt Minion, MD;  Location: [facility];  Service: Orthopedics;  Laterality: Left;   ANTERIOR FUSION CERVICAL SPINE  02/06/06   C4-5, C5-6, C6-7; SURGEON DR. MAX COHEN   AV FISTULA PLACEMENT Left 06/02/2020   Procedure: ARTERIOVENOUS (AV) FISTULA CREATION LEFT;  Surgeon: Waynetta Sandy, MD;  Location: [facility];  Service: Vascular;  Laterality:  Left;   BACK SURGERY     x 3   [place] Left 07/21/2020   Procedure: LEFT UPPER ARM ATERIOVENOUS SUPERFISTULALIZATION;  Surgeon: Waynetta Sandy, MD;  Location: [facility];  Service: Vascular;  Laterality: Left;   BELOW KNEE LEG AMPUTATION Right    CARDIAC CATHETERIZATION  10/31/04   2009   CAROTID ENDARTERECTOMY  11/10/10   CAROTID ENDARTERECTOMY Left 11/10/2010   Subtotal occlusion of left internal carotid artery with left hemispheric transient ischemic attacks.   CAROTID STENT     CARPAL TUNNEL RELEASE Right 10/21/2013   Procedure: RIGHT CARPAL TUNNEL RELEASE;  Surgeon: Wynonia Sours, MD;  Location: [place];  Service: Orthopedics;  Laterality: Right;   CHOLECYSTECTOMY     COLON SURGERY     COLONOSCOPY     COLOSTOMY REVERSAL  05/21/2018   ileostomy reversal   CYSTOSCOPY WITH STENT PLACEMENT Bilateral 01/13/2018   Procedure: CYSTOSCOPY WITH BILATERAL URETERAL CATHETER PLACEMENT;  Surgeon: Ardis Hughs, MD;  Location[facility];  Service: Urology;  Laterality: Bilateral;   ESOPHAGEAL MANOMETRY Bilateral 07/19/2014   Procedure: ESOPHAGEAL MANOMETRY (EM);  Surgeon: Jerene Bears, MD;  [facility];  Service: Gastroenterology;  Laterality: Bilateral;   EYE SURGERY Bilateral 2020   cataract   FEMORAL ARTERY STENT     x6   FINGER SURGERY     FOOT SURGERY  04/25/2016    EXCISION BASE 5TH METATARSAL AND PARTIAL CUBOID LEFT FOOT   HERNIA REPAIR     LEFT INGUINAL AND UMBILICAL REPAIRS   HERNIA REPAIR     I & D EXTREMITY Left 04/25/2016   Procedure: EXCISION BASE 5TH METATARSAL AND PARTIAL CUBOID LEFT FOOT;  Surgeon: Newt Minion, MD;  Location: [facility];  Service: Orthopedics;  Laterality: Left;   ILEOSTOMY  01/13/2018   Procedure: ILEOSTOMY;  Surgeon: Clovis Riley, MD;  Location[facility];  Service: General;;   ILEOSTOMY CLOSURE N/A 05/21/2018   Procedure: ILEOSTOMY REVERSAL ERAS PATHWAY;  Surgeon: Clovis Riley, MD;  Location: [facility];   Service: General;  Laterality: N/A;   IR RADIOLOGIST EVAL & MGMT  11/19/2017   IR RADIOLOGIST EVAL & MGMT  12/03/2017   IR RADIOLOGIST EVAL & MGMT  12/18/2017   JOINT REPLACEMENT Right 2001   Total knee   LAMINECTOMY     X 3 LUMBAR AND X 2 CERVICAL SPINE OPERATIONS   LAPAROSCOPIC CHOLECYSTECTOMY W/ CHOLANGIOGRAPHY  11/09/04   SURGEON DR. [place] CATH AND CORONARY ANGIOGRAPHY N/A 09/16/2018   Procedure: LEFT HEART CATH AND CORONARY ANGIOGRAPHY;  Surgeon: Nigel Mormon, MD;  Location: [facility];  Service: Cardiovascular;  Laterality: N/A;   LEFT HEART CATHETERIZATION WITH CORONARY ANGIOGRAM N/A 10/29/2014   Procedure: LEFT HEART CATHETERIZATION WITH CORONARY ANGIOGRAM;  Surgeon: Laverda Page, MD;  Location: [facility];  Service: Cardiovascular;  Laterality: N/A;   LIGATION OF COMPETING BRANCHES OF ARTERIOVENOUS FISTULA Left 07/21/2020   Procedure: LIGATION OF COMPETING BRANCHES OF LEFT UPPER ARM ARTERIOVENOUS FISTULA;  Surgeon: Waynetta Sandy, MD;  Location: [place] [facility];  Service: Vascular;  Laterality: Left;   LOWER EXTREMITY ANGIOGRAM N/A 03/19/2012   Procedure: LOWER EXTREMITY ANGIOGRAM;  Surgeon: Burnell Blanks, MD;  [facility];  Service: Cardiovascular;  Laterality: N/A;   NECK SURGERY     PARTIAL COLECTOMY N/A 01/13/2018   Procedure: LAPAROSCOPIC ASSISTED   SIGMOID COLECTOMY ILEOSTOMY;  Surgeon: Clovis Riley, MD;  [facility];  Service: General;  Laterality: N/A;   PENILE PROSTHESIS IMPLANT  08/14/05   INFRAPUBIC INSERTION OF INFLATABLE PENILE PROSTHESIS; SURGEON DR. Amalia Hailey   PENILE PROSTHESIS IMPLANT     PERCUTANEOUS CORONARY STENT INTERVENTION (PCI-S) Right 10/29/2014   Procedure: PERCUTANEOUS CORONARY STENT INTERVENTION (PCI-S);  Surgeon: Laverda Page, MD;  Location: [facility] CATH LAB;  Service: Cardiovascular;  Laterality: Right;   SHOULDER ARTHROSCOPY     SPINE SURGERY     TOE AMPUTATION Left    TONSILLECTOMY     TOTAL  KNEE ARTHROPLASTY  07/2002   RIGHT KNEE ; SURGEON  DR. [name] ALSO HAD ARTHROSCOPIC RIGHT KNEE IN  10/2001   TOTAL KNEE ARTHROPLASTY     ULNAR NERVE TRANSPOSITION Right 10/21/2013   Procedure: RIGHT ELBOW  ULNAR NERVE DECOMPRESSION;  Surgeon: Wynonia Sours, MD;  Location: [place];  Service: Orthopedics;  Laterality: Right;    Social History:  reports that he quit smoking about 9 years ago. His smoking use included cigarettes. He has a 70.00 pack-year smoking history. He has never used smokeless tobacco. He reports that he does not currently use alcohol. He reports  that he does not use drugs.  Allergies  Allergen Reactions   Contrast Media [Iodinated Diagnostic Agents]     Difficulty breathing     Ivp Dye [Iodinated Diagnostic Agents] Anaphylaxis and Other (See Comments)    Breathing problems, altered mental state    Adhesive [Tape] Rash    Rash after 3-4 days use    Family History  Problem Relation Age of Onset   Heart disease Father        Before age 89-  CAD, BPG   Diabetes Father        Amputation   Cancer Father        PROSTATE   Hyperlipidemia Father    Hypertension Father    Heart attack Father        Triple BPG   Varicose Veins Father    Colon cancer Brother    Diabetes Brother    Heart disease Brother 69       A-Fib. Before age 30   Hyperlipidemia Brother    Hypertension Brother    Cancer Sister        Breast   Hyperlipidemia Sister    Hypertension Sister    Hypertension Son    Arthritis Other        GRANDMOTHER   Hypertension Other        OTHER FAMILY MEMBERS     Prior to Admission medications   Medication Sig Start Date End Date Taking? Authorizing Provider  amLODipine (NORVASC) 5 MG tablet Take 5 mg by mouth daily. 01/30/21  Yes [provider]  apixaban (ELIQUIS) 5 MG TABS tablet Take 5 mg by mouth See admin instructions. Only take on non-dialysis days Monday,Wednesday,Friday,sunday   Yes [provider]   atorvastatin (LIPITOR) 40 MG tablet Take 40 mg by mouth at bedtime.    Yes [provider]  BIDIL 20-37.5 MG tablet Take 1 tablet by mouth See admin instructions. Take on Monday,Wednesday,Friday,sunday 04/28/20  Yes [provider]  carvedilol (COREG) 25 MG tablet Take 1.5 tablets (37.5 mg total) by mouth 2 (two) times daily. Patient taking differently: Take 12.5 mg by mouth 2 (two) times daily. 08/01/16  Yes Adrian Prows, MD  famotidine (PEPCID) 40 MG tablet Take 40 mg by mouth daily. 04/29/21  Yes [provider]  fentaNYL (DURAGESIC - DOSED MCG/HR) 50 MCG/HR Place 50 mcg onto the skin every 3 (three) days.   Yes [provider]  folic acid-vitamin b complex-vitamin c-selenium-zinc (DIALYVITE) 3 MG TABS tablet Take 1 tablet by mouth daily.   Yes [provider]  Glucosamine HCl (GLUCOSAMINE PO) Take 100 tablets by mouth 2 (two) times daily.   Yes [provider]  HUMALOG KWIKPEN 100 UNIT/ML KiwkPen Inject 13 Units into the skin 3 (three) times daily. 11/14/17  Yes [provider]  insulin glargine (LANTUS) 100 UNIT/ML injection Inject 55 Units into the skin at bedtime.   Yes [provider]  ketoconazole (NIZORAL) 2 % cream Apply 1 application topically 2 (two) times daily as needed for irritation.  03/07/18  Yes [provider]  lidocaine-prilocaine (EMLA) cream Dialysis days Tuesday,Thursday and saturday 06/06/21  Yes [provider]  linaclotide (LINZESS) 145 MCG CAPS capsule Take 145 mcg by mouth daily as needed (constipation).    Yes [provider]  loratadine (CLARITIN) 10 MG tablet Take 10 mg by mouth daily.   Yes [provider]  NEEDLE, REUSABLE, 22 G 22G X 1-1/2" MISC 1 Units by Does not apply  route as directed. For B12 IM inj 02/15/18  Yes Elgergawy, Silver Huguenin, MD  omega-3 acid ethyl esters (LOVAZA) 1 g capsule Take 2 g by mouth 2 (two) times daily.   Yes [provider]   ondansetron (ZOFRAN) 4 MG tablet Take 4 mg by mouth every 6 (six) hours as needed for nausea or vomiting.   Yes [provider]  oxyCODONE-acetaminophen (PERCOCET) 7.5-325 MG tablet Take 1 tablet by mouth 3 (three) times daily as needed for moderate pain. 12/07/20  Yes [provider]  pantoprazole (PROTONIX) 40 MG tablet Take 40 mg by mouth daily.   Yes [provider]  Polyvinyl Alcohol-Povidone PF 1.4-0.6 % SOLN Place 1 drop into both eyes 2 (two) times daily as needed (dry eyes).    Yes [provider]  pregabalin (LYRICA) 100 MG capsule Take 1 capsule (100 mg total) by mouth 3 (three) times daily. 05/10/16  Yes Tat, Shanon Brow, MD  rOPINIRole (REQUIP) 2 MG tablet Take 2 mg by mouth at bedtime.   Yes [provider]  sulfamethoxazole-trimethoprim (BACTRIM DS) 800-160 MG tablet Take 1 tablet by mouth 2 (two) times daily. Patient taking differently: Take 1 tablet by mouth See admin instructions. Bid x 10 days 06/12/21  Yes Newt Minion, MD  Syringe/Needle, Disp, (SYRINGE 3CC/22GX1-1/2") 22G X 1-1/2" 3 ML MISC 1 Syringe by Does not apply route as directed. For b12 IM inj 02/15/18  Yes Elgergawy, Silver Huguenin, MD  tamsulosin (FLOMAX) 0.4 MG CAPS capsule Take 0.4 mg by mouth daily.   Yes [provider]  torsemide (DEMADEX) 20 MG tablet Take 20 mg by mouth in the morning and at bedtime. Take on extra for swelling   Yes [provider]  nitroGLYCERIN (NITRODUR - DOSED IN MG/24 HR) 0.2 mg/hr patch Place 1 patch (0.2 mg total) onto the skin daily. 06/12/21   Newt Minion, MD    Physical Exam: Vitals:   06/13/21 2300 06/13/21 2315 06/13/21 2330 06/13/21 2345  BP: (!) 153/134 (!) 149/71 135/63 (!) 149/66  Pulse: (!) 114 (!) 119 (!) 111 (!) 109  Resp: 20 18 20  (!) 22  Temp:      TempSrc:      SpO2: 93% 94% 91% 93%  Weight:      Height:       Constitutional: Resting in bed with head elevated, appears tired but in NAD, calm Eyes: PERRL, lids and  conjunctivae normal ENMT: Mucous membranes are dry. Posterior pharynx clear of any exudate or lesions.Normal dentition.  Neck: normal, supple, no masses. Respiratory: clear to auscultation bilaterally, no wheezing, no crackles. Normal respiratory effort. No accessory muscle use.  Cardiovascular: Irregularly irregular, tachycardic.  No extremity edema.  Difficult to palpate left pedal pulses. Abdomen: Mild suprapubic tenderness, no masses palpated. No hepatosplenomegaly. GU: Scrotal and penile swelling with associated erythema extending to the suprapubic region.  Condom cath in place, small amount purulent discharge from urethra. Musculoskeletal: S/p right BKA with prosthesis in place, s/p left fourth and fifth toe amputation.  ROM upper extremities intact. Skin: Scrotal and penile swelling and erythema.  Gangrenous ulceration to left lateral foot as pictured below, dry flaky skin throughout Neurologic: CN 2-12 grossly intact. Sensation intact. Strength 5/5 in all extremities. Psychiatric: Normal judgment and insight. Alert and oriented x 3. Normal mood.      Labs on Admission: I have personally reviewed following labs and imaging studies  CBC: Recent Labs  Lab 06/13/21 2017  WBC 21.7*  NEUTROABS 19.5*  HGB 12.1*  HCT 36.8*  MCV 91.3  PLT 379   Basic Metabolic Panel: Recent Labs  Lab 06/13/21 2017  NA 136  K 4.1  CL 91*  CO2 26  GLUCOSE 336*  BUN 18  CREATININE 4.23*  CALCIUM 8.7*   GFR: Estimated Creatinine Clearance: 22.2 mL/min (A) (by C-G formula based on SCr of 4.23 mg/dL (H)). Liver Function Tests: Recent Labs  Lab 06/13/21 2017  AST 36  ALT 22  ALKPHOS 123  BILITOT 1.1  PROT 6.8  ALBUMIN 2.7*   No results for input(s): LIPASE, AMYLASE in the last 168 hours. No results for input(s): AMMONIA in the last 168 hours. Coagulation Profile: Recent Labs  Lab 06/13/21 2017  INR 1.5*   Cardiac Enzymes: No results for input(s): CKTOTAL, CKMB, CKMBINDEX,  TROPONINI in the last 168 hours. BNP (last 3 results) No results for input(s): PROBNP in the last 8760 hours. HbA1C: No results for input(s): HGBA1C in the last 72 hours. CBG: No results for input(s): GLUCAP in the last 168 hours. Lipid Profile: No results for input(s): CHOL, HDL, LDLCALC, TRIG, CHOLHDL, LDLDIRECT in the last 72 hours. Thyroid Function Tests: No results for input(s): TSH, T4TOTAL, FREET4, T3FREE, THYROIDAB in the last 72 hours. Anemia Panel: No results for input(s): VITAMINB12, FOLATE, FERRITIN, TIBC, IRON, RETICCTPCT in the last 72 hours. Urine analysis:    Component Value Date/Time   COLORURINE YELLOW 06/19/2019 0435   APPEARANCEUR CLEAR 06/19/2019 0435   LABSPEC 1.017 06/19/2019 0435   PHURINE 5.0 06/19/2019 0435   GLUCOSEU >=500 (A) 06/19/2019 0435   HGBUR NEGATIVE 06/19/2019 0435   BILIRUBINUR NEGATIVE 06/19/2019 0435   KETONESUR NEGATIVE 06/19/2019 0435   PROTEINUR >=300 (A) 06/19/2019 0435   UROBILINOGEN 0.2 01/14/2015 0346   NITRITE NEGATIVE 06/19/2019 0435   LEUKOCYTESUR NEGATIVE 06/19/2019 0435    Radiological Exams on Admission: CT ABDOMEN PELVIS WO CONTRAST  Result Date: 06/13/2021 CLINICAL DATA:  Inflammatory change in the perineum and scrotum with elevated white blood cell count concerning for possible Fournier's gangrene. Patient states a sensation of the penile prosthesis abutting the tip of the penis. EXAM: CT ABDOMEN AND PELVIS WITHOUT CONTRAST TECHNIQUE: Multidetector CT imaging of the abdomen and pelvis was performed following the standard protocol without IV contrast. COMPARISON:  06/19/2019 FINDINGS: Lower chest: Lung bases demonstrate minimal atelectatic changes on the left. No focal effusion or infiltrate is seen. Hepatobiliary: No focal liver abnormality is seen. Status post cholecystectomy. No biliary dilatation. Pancreas: Unremarkable. No pancreatic ductal dilatation or surrounding inflammatory changes. Spleen: Normal in size without focal  abnormality. Adrenals/Urinary Tract: Adrenal glands are within normal limits. Stomach/Bowel: Kidneys are well visualized bilaterally without renal calculi or obstructive changes. The bladder is partially distended. Vascular/Lymphatic: No obstructive or inflammatory changes of the colon are seen. The appendix is within normal limits. Postsurgical changes in the sigmoid are noted. Small bowel and stomach are unremarkable. Reproductive: Prostate appears within normal limits. There are findings of a penile prosthesis identified. The reservoir in the pelvis appears within normal limits although there is significant Pericatheter inflammatory change surrounding the catheter extending to the pump. The penile portion of the prosthesis extends to the penis tip although no erosive changes are identified. Some inflammatory change is noted surrounding the squeeze pump portion of the prosthesis and extending into the scrotum although no definitive fluid collection is seen. Some calcifications associated with the testicles are noted likely chronic in nature. Other: No free fluid is noted.  No hernia is seen. Musculoskeletal:  Postsurgical changes in the lower lumbar spine are noted. Degenerative changes are seen as well. No acute bony abnormality is noted. IMPRESSION: Changes in the scrotum and prepubic soft tissues consistent with localized infection surrounding the catheter and squeeze pump of the penile prosthesis. No definitive abscess is seen at this time. The penile component of the prosthesis does not appear to erode through the skin. No other focal abnormality is noted. Critical Value/emergent results were called by telephone at the time of interpretation on 06/13/2021 at 9:30 pm to Dr. Marda Stalker , who verbally acknowledged these results. Electronically Signed   By: Inez Catalina M.D.   On: 06/13/2021 21:39   DG Chest Port 1 View  Result Date: 06/13/2021 CLINICAL DATA:  Wound infection. EXAM: PORTABLE CHEST 1  VIEW COMPARISON:  Chest x-ray dated November 18, 2020. FINDINGS: The heart size and mediastinal contours are within normal limits. Both lungs are clear. The visualized skeletal structures are unremarkable. IMPRESSION: No active disease. Electronically Signed   By: Titus Dubin M.D.   On: 06/13/2021 20:42   DG Foot 2 Views Left  Result Date: 06/13/2021 CLINICAL DATA:  Wound infection EXAM: LEFT FOOT - 2 VIEW COMPARISON:  10/11/2016 FINDINGS: Bones appear demineralized. There are vascular calcifications. Patient is status post amputation of the fifth digit and partial amputation fourth digit at the level of the MTP joint. Presumed wound lateral aspect of the distal foot. Possible osteopenia and cortical erosive change at the head of the fourth metatarsal. IMPRESSION: Osteopenia with questionable erosive change/osteomyelitis at the head of the fourth metatarsal. Consider correlation with MRI. Electronically Signed   By: Donavan Foil M.D.   On: 06/13/2021 20:43    EKG: Personally reviewed. A. fib with RVR, rate 154, RBBB, LAFB, bifascicular block.  Rate is faster when compared to prior which showed atypical atrial flutter with RVR.  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 male with medical history significant for severe PAD (s/p right BKA, toe amputations on the left, multiple peripheral interventions) with new left foot gangrenous wound, ESRD on TTS HD, CAD s/p PCI, paroxysmal atrial  flutter/fibrillation on Eliquis, chronic diastolic CHF, POE4MP with neuropathy, HTN, HLD, chronic back/neck pain on chronic pain medication who is admitted with severe sepsis due to infected penile prosthesis.  Severe sepsis due to infected penile prosthesis with associated cellulitis: Patient presenting with fever, tachycardia, tachypnea, leukocytosis, lactic acidosis, and source of infection from infected penile prosthesis with cellulitis and left foot ulcer. -Urology following, recommend keeping n.p.o. for potential penile prosthesis explantation this evening (10/5) -Continue broad-spectrum antibiotics with IV vancomycin, cefepime, Flagyl per pharmacy -Add IV antifungal with fluconazole per urology recommendations -Follow blood cultures -S/p 3.5 L LR in the ED, discontinue further fluids given ESRD status  Paroxysmal atrial fibrillation/flutter with RVR: Presenting with A. fib, rate 150s.  Rates now improving after receiving IV fluid resuscitation. -Holding Eliquis -Continue Coreg  PAD s/p right BKA with gangrenous ulcer over base of left lateral foot: Suspected underlying osteomyelitis on  foot x-ray.  Follows with orthopedics, Dr. Pearla Dubonnet.  Will hold off on MRI at this time as primary issue is infected penile prosthesis.  Continue broad-spectrum antibiotics as above.  ESRD on TTS HD: Completed usual HD 10/4.  Will need nephrology consult in AM for continued dialysis needs while in hospital.  CAD s/p PCI: Denies any chest pain.  Appears stable.  Continue Coreg, atorvastatin.  Holding Bidil for now.  Insulin-dependent type 2 diabetes with hyperglycemia: Hyperglycemic on admission without evidence of DKA/HHS.  Placed on reduced home insulin glargine 30 units qhs and had SSI q4h while NPO.  Hypertension: Hypotensive on arrival, improved after receiving IV fluids.  Continue Coreg, holding other antihypertensive at this time.  Hyperlipidemia: Continue atorvastatin.  Chronic pain  syndrome/neuropathy/RLS: Holding home meds, use IV Dilaudid as needed for severe pain with hold parameters.  OSA: Not using CPAP due to reported intolerance.  Continue supplemental oxygen as needed.  DVT prophylaxis: SCD Code Status: Full code, confirmed with patient Family Communication: Discussed with patient, he has discussed with family Disposition Plan: From home, dispo pending clinical progress Consults called: Urology Level of care: Progressive Admission status:  Status is: Inpatient  Remains inpatient appropriate because:Hemodynamically unstable, IV treatments appropriate due to intensity of illness or inability to take PO, and Inpatient level of care appropriate due to severity of illness  Dispo: The patient is from: Home              Anticipated d/c is to:  Home versus SNF              Patient currently is not medically stable to d/c.  Zada Finders MD [facility]  If 7PM-7AM, please contact night-coverage [email or web address]  06/14/2021, 12:13 AM

## 2021-06-14 NOTE — Interval H&P Note (Signed)
History and Physical Interval Note:  06/14/2021 6:34 PM  [name]  has presented today for surgery, with the diagnosis of Infected Penile Prosthesis.  The various methods of treatment have been discussed with the patient and family. After consideration of risks, benefits and other options for treatment, the patient has consented to  Procedure(s): REMOVAL OF PENILE PROSTHESIS (N/A) as a surgical intervention.  The patient's history has been reviewed, patient examined, no change in status, stable for surgery.  I have reviewed the patient's chart and labs.  Questions were answered to the patient's satisfaction.     Marton Redwood, III

## 2021-06-14 NOTE — Anesthesia Preprocedure Evaluation (Addendum)
Anesthesia Evaluation  Patient identified by MRN, date of birth, ID band Patient awake    Reviewed: Allergy & Precautions, NPO status , Patient's Chart, lab work & pertinent test results  Airway Mallampati: II  TM Distance: >3 FB     Dental   Pulmonary sleep apnea , COPD, former smoker,    breath sounds clear to auscultation       Cardiovascular hypertension, + angina + CAD and + Peripheral Vascular Disease  + dysrhythmias  Rhythm:Irregular Rate:Normal     Neuro/Psych  Headaches,  Neuromuscular disease    GI/Hepatic Neg liver ROS, hiatal hernia, GERD  ,  Endo/Other  diabetes  Renal/GU Renal disease     Musculoskeletal  (+) Arthritis ,   Abdominal   Peds  Hematology   Anesthesia Other Findings   Reproductive/Obstetrics                            Anesthesia Physical Anesthesia Plan  ASA: 3  Anesthesia Plan: General   Post-op Pain Management:    Induction: Intravenous  PONV Risk Score and Plan: 3 and Ondansetron, Dexamethasone and Midazolam  Airway Management Planned: Oral ETT  Additional Equipment:   Intra-op Plan:   Post-operative Plan: Possible Post-op intubation/ventilation  Informed Consent: I have reviewed the patients History and Physical, chart, labs and discussed the procedure including the risks, benefits and alternatives for the proposed anesthesia with the patient or authorized representative who has indicated his/her understanding and acceptance.     Dental advisory given  Plan Discussed with: CRNA and Anesthesiologist  Anesthesia Plan Comments:        Anesthesia Quick Evaluation

## 2021-06-14 NOTE — ED Notes (Signed)
Provider at bedside

## 2021-06-14 NOTE — ED Notes (Signed)
Dr Domenic Polite notified of BP 90/58. Patient is A/A/O, watching TV

## 2021-06-14 NOTE — Anesthesia Procedure Notes (Signed)
Procedure Name: Intubation Date/Time: 06/14/2021 7:58 PM Performed by: Ladarius Seubert T, CRNA Pre-anesthesia Checklist: Patient identified, Emergency Drugs available, Suction available and Patient being monitored Patient Re-evaluated:Patient Re-evaluated prior to induction Oxygen Delivery Method: Circle system utilized Preoxygenation: Pre-oxygenation with 100% oxygen Induction Type: IV induction Ventilation: Mask ventilation without difficulty Laryngoscope Size: Glidescope and 3 Grade View: Grade I Tube type: Oral Tube size: 7.5 mm Number of attempts: 1 Airway Equipment and Method: Stylet and Video-laryngoscopy Placement Confirmation: ETT inserted through vocal cords under direct vision, positive ETCO2 and breath sounds checked- equal and bilateral Secured at: 24 cm Tube secured with: Tape Dental Injury: Teeth and Oropharynx as per pre-operative assessment  Difficulty Due To: Difficulty was anticipated and Difficult Airway- due to reduced neck mobility Future Recommendations: Recommend- induction with short-acting agent, and alternative techniques readily available

## 2021-06-14 NOTE — Transfer of Care (Signed)
Immediate Anesthesia Transfer of Care Note  Patient: [name]  Procedure(s) Performed: REMOVAL OF PENILE PROSTHESIS  Patient Location: PACU  Anesthesia Type:General  Level of Consciousness: awake, alert  and oriented  Airway & Oxygen Therapy: Patient Spontanous Breathing and Patient connected to nasal cannula oxygen  Post-op Assessment: Report given to RN, Post -op Vital signs reviewed and stable and Patient moving all extremities  Post vital signs: Reviewed and stable  Last Vitals:  Vitals Value Taken Time  BP    Temp    Pulse 87 06/14/21 2128  Resp 17 06/14/21 2128  SpO2 96 % 06/14/21 2128  Vitals shown include unvalidated device data.  Last Pain:  Vitals:   06/14/21 1851  TempSrc: Oral  PainSc: 8          Complications: No notable events documented.

## 2021-06-14 NOTE — ED Notes (Signed)
[name] son [phone] requesting an update

## 2021-06-14 NOTE — H&P (View-Only) (Signed)
Surgery posted for this afternoon as an add-on.  Plan for removal of 3 piece IPP

## 2021-06-14 NOTE — Progress Notes (Signed)
Patient seen and examined, admitted earlier this morning by Dr. 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 and [name], paroxysmal A. fib on Eliquis, ESRD on HD TTS, chronic diastolic CHF, type 2 diabetes mellitus, hypertension, dyslipidemia was sent to the ED from dialysis center for evaluation of wound infection. -He is followed by [name] for chronic left foot wound with PAD, at follow-up on 10/3, he discussed potential need for transtibial amputation, also followed by [name], due to have an angiogram later this month for same. -In addition last week also noticed increased swelling and pain around his penis and scrotum, noted purulent discharge from his urethra with protrusion of purulent penile prosthesis. -In the ED he was febrile to 102, tachycardic, tachypneic, his labs noted WBC of 21K, lactic acidosis, x-ray left foot noted osteopenia and questionable erosive changes?  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, hold off on MRI today, -Pending above work-up, management -He is due to have an angiogram later this month by [name] to assess circulation  ESRD on hemodialysis -Underwent HD yesterday,  nephrology consulted  Paroxysmal atrial fibrillation -Continue Coreg, Eliquis on hold for OR  Type 2 diabetes mellitus -Lantus resumed at lower dose, SSI for now  Rest as noted by Dr. Posey Pronto earlier today  Harold Polite, MD

## 2021-06-14 NOTE — Progress Notes (Signed)
Dr. Nyoka Cowden notified of patient's low BP.  Fentanyl patch removed from patient's middle abdomen. Wasted in stericycle with Gregery Na, RN.

## 2021-06-14 NOTE — Progress Notes (Signed)
Pharmacy Antibiotic Note  [name] is a 65 y.o. male admitted on 06/13/2021 with sepsis -suspected source L-sided foot ulcer and penile prosthetic infeciton.   Pharmacy has been consulted for cefepime, vancomycin and diflucan dosing. Urology to take pt for penile prosthetic exploration.  Hemodialysis patient presented after full HD session (10/4) - outpt HD schedule T/T/Sat.   Plan: Vancomycin 1gm T/T/Sat with HD  Cefepime 2gm T/T/Sat with HD  Diflucan 400mg  IV now then 200mg  IV daily Continue Flagyl 500mg  IV Q12h F/u HD schedule and adjust doses as needed Follow up cultures, labs, and clinical improvement   Height: 6\' 2"  (188 cm) Weight: 108.4 kg (239 lb) IBW/kg (Calculated) : 82.2  Temp (24hrs), Avg:101.6 F (38.7 C), Min:101.1 F (38.4 C), Max:102 F (38.9 C)  Recent Labs  Lab 06/13/21 1940 06/13/21 2017 06/13/21 2200  WBC  --  21.7*  --   CREATININE  --  4.23*  --   LATICACIDVEN 3.5*  --  3.9*     Estimated Creatinine Clearance: 22.2 mL/min (A) (by C-G formula based on SCr of 4.23 mg/dL (H)).    Allergies  Allergen Reactions   Contrast Media [Iodinated Diagnostic Agents]     Difficulty breathing     Ivp Dye [Iodinated Diagnostic Agents] Anaphylaxis and Other (See Comments)    Breathing problems, altered mental state    Adhesive [Tape] Rash    Rash after 3-4 days use    Antimicrobials this admission: Vancomycin 10/4>> Cefepime 10/4>> Metronidazole 10/4>> Diflucan 10/5>>  Microbiology results: 10/4 BCx:    Thank you for allowing pharmacy to be a part of this patient's care.  Sherlon Handing, PharmD, BCPS Please see amion for complete clinical pharmacist phone list 06/14/2021 12:17 AM

## 2021-06-14 NOTE — Sepsis Progress Note (Signed)
Notified bedside nurse of need to order repeat lactic acid. Second lactic acid results were higher than initial, asked to contact doctor to order repeat lactic acid due to results.

## 2021-06-14 NOTE — Op Note (Signed)
Operative Note  Preoperative diagnosis:  1.  Infected penile prosthesis  Postoperative diagnosis: 1.  Infected penile prosthesis  Procedure(s): 1. Removal of 3 piece inflatable penile prosthesis  Surgeon: Link Snuffer, MD  Assistants: Raynelle Bring, MD--an assistant was necessary due to the complexity of the surgery requiring retraction, suctioning, etc.  Anesthesia: General  Complications: None immediate  EBL: 25 cc  Specimens: 1.  Wound cultures  Drains/Catheters: 1.  16 French Foley catheter 2.  2 JP drains  Intraoperative findings: Copious amounts of pus surrounding all components of the penile prosthesis including within the corpora bilaterally.  Pus was not extending towards the reservoir and the space of Retzius.  All components completely removed including corporal cylinders, bilateral 2 cm rear-tip extenders, scrotal pump, reservoir.  Indication: 65 year old male had a penile prosthesis placed in 2006 presented with sepsis.  He had been complaining about a several week history of scrotal pain and penile pain and was concerned that his penile prosthesis was starting to erode into his urethra.  Examination and CT scan were consistent with infection.  He was stabilized and antibiotics were started and he presents for explantation of his penile prosthesis.  Description of procedure:  The patient was identified and consent was obtained.  The patient was taken to the operating room and placed in the supine position.  The patient was placed under general anesthesia.  Perioperative antibiotics were administered.  Patient was prepped and draped in a standard sterile fashion and a timeout was performed.  A 16 French Foley catheter was placed.  I made a 4 cm infrapubic incision along the level of the previous infrapubic scar and carried this down with Bovie electrocautery.  Copious amounts of pus immediately were evacuated from a pocket.  Cultures were obtained.  The tubing was  identified.  On cut settings I dissected along the tubing.  I delivered the scrotal pump out of the incision.  I then followed the left-sided tubing to the corpora on the left and used a right angle to pass it around the corporal cylinder and extracted from the corpora.  The 2 cm rear tip was attached.  I followed the right-sided tubing to the corpora on the right and explanted the cylinder and confirmed there was a 2 cm rear tip extender attached to this as well.  I followed the reservoir tubing and bluntly dissected down to the space of Retzius and was able to extract the reservoir.  I copiously irrigated all areas with normal saline.  The wound was clean.  I placed a JP drain on each side of the infrapubic incision.  On the right place the drain down into the scrotum.  The left-sided drain was next to the corpora.  Drains were secured down with nylon stitches.  The subcutaneous tissue was loosely reapproximated with interrupted 2-0 Vicryl.  The skin was closed with staples.  Quarter percent Marcaine was instilled for anesthetic effect.  This concluded the operation.  Patient tolerated the procedure well was stable postoperative.  Plan: Continue broad-spectrum antibiotics and fluconazole and follow-up on fungal and bacterial cultures.

## 2021-06-15 ENCOUNTER — Encounter (HOSPITAL_COMMUNITY): Payer: Self-pay | Admitting: Urology

## 2021-06-15 DIAGNOSIS — I480 Paroxysmal atrial fibrillation: Secondary | ICD-10-CM

## 2021-06-15 DIAGNOSIS — N186 End stage renal disease: Secondary | ICD-10-CM | POA: Diagnosis not present

## 2021-06-15 DIAGNOSIS — I739 Peripheral vascular disease, unspecified: Secondary | ICD-10-CM

## 2021-06-15 DIAGNOSIS — I87332 Chronic venous hypertension (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: Secondary | ICD-10-CM | POA: Diagnosis not present

## 2021-06-15 DIAGNOSIS — A419 Sepsis, unspecified organism: Secondary | ICD-10-CM | POA: Diagnosis not present

## 2021-06-15 DIAGNOSIS — I2510 Atherosclerotic heart disease of native coronary artery without angina pectoris: Secondary | ICD-10-CM | POA: Diagnosis not present

## 2021-06-15 DIAGNOSIS — L97929 Non-pressure chronic ulcer of unspecified part of left lower leg with unspecified severity: Secondary | ICD-10-CM

## 2021-06-15 DIAGNOSIS — Z992 Dependence on renal dialysis: Secondary | ICD-10-CM

## 2021-06-15 LAB — BASIC METABOLIC PANEL
Anion gap: 14 (ref 5–15)
BUN: 40 mg/dL — ABNORMAL HIGH (ref 8–23)
CO2: 24 mmol/L (ref 22–32)
Calcium: 8.1 mg/dL — ABNORMAL LOW (ref 8.9–10.3)
Chloride: 96 mmol/L — ABNORMAL LOW (ref 98–111)
Creatinine, Ser: 5.36 mg/dL — ABNORMAL HIGH (ref 0.61–1.24)
GFR, Estimated: 11 mL/min — ABNORMAL LOW (ref >60)
Glucose, Bld: 280 mg/dL — ABNORMAL HIGH (ref 70–99)
Potassium: 3.8 mmol/L (ref 3.5–5.1)
Sodium: 134 mmol/L — ABNORMAL LOW (ref 135–145)

## 2021-06-15 LAB — CBC
HCT: 29.9 % — ABNORMAL LOW (ref 39.0–52.0)
Hemoglobin: 10.1 g/dL — ABNORMAL LOW (ref 13.0–17.0)
MCH: 31.1 pg (ref 26.0–34.0)
MCHC: 33.8 g/dL (ref 30.0–36.0)
MCV: 92 fL (ref 80.0–100.0)
Platelets: 219 10*3/uL (ref 150–400)
RBC: 3.25 MIL/uL — ABNORMAL LOW (ref 4.22–5.81)
RDW: 13.2 % (ref 11.5–15.5)
WBC: 15.5 10*3/uL — ABNORMAL HIGH (ref 4.0–10.5)
nRBC: 0 % (ref 0.0–0.2)

## 2021-06-15 LAB — GLUCOSE, CAPILLARY
Glucose-Capillary: 159 mg/dL — ABNORMAL HIGH (ref 70–99)
Glucose-Capillary: 307 mg/dL — ABNORMAL HIGH (ref 70–99)
Glucose-Capillary: 271 mg/dL — ABNORMAL HIGH (ref 70–99)
Glucose-Capillary: 329 mg/dL — ABNORMAL HIGH (ref 70–99)
Glucose-Capillary: 257 mg/dL — ABNORMAL HIGH (ref 70–99)
Glucose-Capillary: 243 mg/dL — ABNORMAL HIGH (ref 70–99)

## 2021-06-15 LAB — SURGICAL PCR SCREEN
MRSA, PCR: POSITIVE — AB
Staphylococcus aureus: POSITIVE — AB

## 2021-06-15 LAB — HEPATITIS B SURFACE ANTIBODY, QUANTITATIVE: Hep B S AB Quant (Post): >1000 m[IU]/mL (ref >9.9)

## 2021-06-15 MED ORDER — MUPIROCIN 2 % EX OINT
1.0000 "application " | TOPICAL_OINTMENT | Freq: Two times a day (BID) | CUTANEOUS | Status: AC
  Administered 2021-06-15 – 2021-06-19 (×9): 1 via NASAL
  Filled 2021-06-15 (×4): qty 22

## 2021-06-15 MED ORDER — FENTANYL 50 MCG/HR TD PT72
1.0000 | MEDICATED_PATCH | TRANSDERMAL | Status: DC
  Administered 2021-06-15 – 2021-06-28 (×6): 1 via TRANSDERMAL
  Filled 2021-06-15 (×7): qty 1

## 2021-06-15 MED ORDER — INSULIN GLARGINE-YFGN 100 UNIT/ML ~~LOC~~ SOLN
50.0000 [IU] | Freq: Every day | SUBCUTANEOUS | Status: DC
  Administered 2021-06-15 – 2021-06-19 (×5): 50 [IU] via SUBCUTANEOUS
  Filled 2021-06-15 (×6): qty 0.5

## 2021-06-15 NOTE — Progress Notes (Signed)
Pt brought from hemodialysis. Pt A&Ox4. RN will continue to monitor pt.  06/15/21 1759  Vitals  Temp 98.7 F (37.1 C)  Temp Source Oral  BP (!) 153/78  MAP (mmHg) 97  BP Location Right Arm  BP Method Automatic  Patient Position (if appropriate) Lying  Pulse Rate 87  Pulse Rate Source Monitor  Resp 15  MEWS COLOR  MEWS Score Color Green  Oxygen Therapy  SpO2 98 %  O2 Device Room Air  MEWS Score  MEWS Temp 0  MEWS Systolic 0  MEWS Pulse 0  MEWS RR 0  MEWS LOC 0  MEWS Score 0

## 2021-06-15 NOTE — Progress Notes (Signed)
Report given to hemodialysis nurse, Destiny Boroughs,RN.

## 2021-06-15 NOTE — Progress Notes (Signed)
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 and [name], paroxysmal atrial fibrillation on Eliquis, ESRD on HD TTS, chronic diastolic heart failure, diabetes mellitus type 2, hypertension, dyslipidemia was sent from dialysis center to ED for evaluation of wound infection. He is followed by [name] for chronic left foot wound with PAD, at follow-up on 10/3, he discussed potential need for transtibial amputation, also followed by [name], due to have an angiogram later this month for same. Patient noticed increased swelling and pain around the penis and scrotum, noted purulent discharge from the urethra and protrusion of purulent penile prosthesis.  In the ED he was found to have WBC of 21,000, lactic acidosis  Subjective   Patient seen and examined, s/p removal of 3 piece inflatable penile prosthesis.  Denies pain.   Assessment/Plan:     Severe sepsis/infected pleural prosthesis -S/p removal of infected bioprosthesis -WBC down to 15.5 -Continue fluconazole, cefepime -Follow wound culture results  Peripheral arterial disease/right BKA -Follows Dr.[name] as outpatient -X-ray of left foot showed osteopenia with questionable erosive changes/osteomyelitis at the head of fourth metatarsal -We will obtain MRI of left foot  ESRD on HD -Nephrology consulted -Underwent hemodialysis on Tuesday  Paroxysmal atrial fibrillation -Continue Coreg -Eliquis on hold -We will restart Eliquis when okay with urology  Diabetes mellitus type 2 -CBGs elevated -We will increase the dose of Lantus to 50 units subcu daily  CAD s/p PCI -Continue Coreg, atorvastatin  Hypertension -Blood pressure is stable -Continue  carvedilol     Scheduled medications:    atorvastatin  40 mg Oral QHS   carvedilol  37.5 mg Oral BID WC   Chlorhexidine Gluconate Cloth  6 each Topical Q0600   fentaNYL  1 patch Transdermal Q72H   insulin aspart  0-9 Units Subcutaneous Q4H   insulin glargine-yfgn  30 Units Subcutaneous QHS   mupirocin ointment  1 application Nasal BID   sodium chloride flush  3 mL Intravenous Q12H     Data Reviewed:   CBG:  Recent Labs  Lab 06/14/21 2124 06/14/21 2308 06/15/21 0346 06/15/21 0812 06/15/21 1251  GLUCAP 159* 187* 307* 271* 329*    SpO2: 98 % O2 Flow Rate (L/min): 3 L/min    Vitals:   06/15/21 1700 06/15/21 1711 06/15/21 1713 06/15/21 1759  BP: 90/60 (!) 90/50 (!) 114/57 (!) 153/78  Pulse:    87  Resp: 19 15 16 15   Temp:    98.7 F (37.1 C)  TempSrc:    Oral  SpO2:   97% 98%  Weight:   114.8 kg   Height:         Intake/Output Summary (Last 24 hours) at 06/15/2021 1927 Last data filed at 06/15/2021 1843 Gross per 24 hour  Intake 1120 ml  Output 2395 ml  Net -1275 ml    10/05 0701 - 10/06 1900 In: 1120 [P.O.:370; I.V.:500] Out: 2545 [Urine:350; Drains:55]  Filed Weights   06/15/21 0600 06/15/21 1358 06/15/21 1713  Weight: 112.4 kg 110 kg 114.8 kg    Data Reviewed: Basic Metabolic Panel: Recent Labs  Lab 06/13/21 2017 06/14/21 0213 06/15/21 0120  NA 136 133* 134*  K 4.1 4.1 3.8  CL 91* 94* 96*  CO2 26 27  24  GLUCOSE 336* 392* 280*  BUN 18 24* 40*  CREATININE 4.23* 4.28* 5.36*  CALCIUM 8.7* 7.8* 8.1*   Liver Function Tests: Recent Labs  Lab 06/13/21 2017  AST 36  ALT 22  ALKPHOS 123  BILITOT 1.1  PROT 6.8  ALBUMIN 2.7*   No results for input(s): LIPASE, AMYLASE in the last 168 hours. No results for input(s): AMMONIA in the last 168 hours. CBC: Recent Labs  Lab 06/13/21 2017 06/14/21 0213 06/15/21 0120  WBC 21.7* 15.2* 15.5*  NEUTROABS 19.5*  --   --   HGB 12.1* 10.1* 10.1*  HCT 36.8* 30.6* 29.9*  MCV 91.3 93.0 92.0  PLT  358 241 219   Cardiac Enzymes: No results for input(s): CKTOTAL, CKMB, CKMBINDEX, TROPONINI in the last 168 hours. BNP (last 3 results) No results for input(s): BNP in the last 8760 hours.  ProBNP (last 3 results) No results for input(s): PROBNP in the last 8760 hours.  CBG: Recent Labs  Lab 06/14/21 2124 06/14/21 2308 06/15/21 0346 06/15/21 0812 06/15/21 1251  GLUCAP 159* 187* 307* 271* 329*       Radiology Reports  CT ABDOMEN PELVIS WO CONTRAST  Result Date: 06/13/2021 CLINICAL DATA:  Inflammatory change in the perineum and scrotum with elevated white blood cell count concerning for possible Fournier's gangrene. Patient states a sensation of the penile prosthesis abutting the tip of the penis. EXAM: CT ABDOMEN AND PELVIS WITHOUT CONTRAST TECHNIQUE: Multidetector CT imaging of the abdomen and pelvis was performed following the standard protocol without IV contrast. COMPARISON:  06/19/2019 FINDINGS: Lower chest: Lung bases demonstrate minimal atelectatic changes on the left. No focal effusion or infiltrate is seen. Hepatobiliary: No focal liver abnormality is seen. Status post cholecystectomy. No biliary dilatation. Pancreas: Unremarkable. No pancreatic ductal dilatation or surrounding inflammatory changes. Spleen: Normal in size without focal abnormality. Adrenals/Urinary Tract: Adrenal glands are within normal limits. Stomach/Bowel: Kidneys are well visualized bilaterally without renal calculi or obstructive changes. The bladder is partially distended. Vascular/Lymphatic: No obstructive or inflammatory changes of the colon are seen. The appendix is within normal limits. Postsurgical changes in the sigmoid are noted. Small bowel and stomach are unremarkable. Reproductive: Prostate appears within normal limits. There are findings of a penile prosthesis identified. The reservoir in the pelvis appears within normal limits although there is significant Pericatheter inflammatory change  surrounding the catheter extending to the pump. The penile portion of the prosthesis extends to the penis tip although no erosive changes are identified. Some inflammatory change is noted surrounding the squeeze pump portion of the prosthesis and extending into the scrotum although no definitive fluid collection is seen. Some calcifications associated with the testicles are noted likely chronic in nature. Other: No free fluid is noted.  No hernia is seen. Musculoskeletal: Postsurgical changes in the lower lumbar spine are noted. Degenerative changes are seen as well. No acute bony abnormality is noted. IMPRESSION: Changes in the scrotum and prepubic soft tissues consistent with localized infection surrounding the catheter and squeeze pump of the penile prosthesis. No definitive abscess is seen at this time. The penile component of the prosthesis does not appear to erode through the skin. No other focal abnormality is noted. Critical Value/emergent results were called by telephone at the time of interpretation on 06/13/2021 at 9:30 pm to Dr. Marda Stalker , who verbally acknowledged these results. Electronically Signed   By: Inez Catalina M.D.   On: 06/13/2021 21:39   DG Chest Port 1 View  Result Date:  06/13/2021 CLINICAL DATA:  Wound infection. EXAM: PORTABLE CHEST 1 VIEW COMPARISON:  Chest x-ray dated November 18, 2020. FINDINGS: The heart size and mediastinal contours are within normal limits. Both lungs are clear. The visualized skeletal structures are unremarkable. IMPRESSION: No active disease. Electronically Signed   By: Titus Dubin M.D.   On: 06/13/2021 20:42   DG Foot 2 Views Left  Result Date: 06/13/2021 CLINICAL DATA:  Wound infection EXAM: LEFT FOOT - 2 VIEW COMPARISON:  10/11/2016 FINDINGS: Bones appear demineralized. There are vascular calcifications. Patient is status post amputation of the fifth digit and partial amputation fourth digit at the level of the MTP joint. Presumed wound  lateral aspect of the distal foot. Possible osteopenia and cortical erosive change at the head of the fourth metatarsal. IMPRESSION: Osteopenia with questionable erosive change/osteomyelitis at the head of the fourth metatarsal. Consider correlation with MRI. Electronically Signed   By: Donavan Foil M.D.   On: 06/13/2021 20:43       Antibiotics: Anti-infectives (From admission, onward)    Start     Dose/Rate Route Frequency Ordered Stop   06/15/21 2200  fluconazole (DIFLUCAN) IVPB 200 mg        200 mg 100 mL/hr over 60 Minutes Intravenous Daily at bedtime 06/14/21 0017     06/15/21 1800  ceFEPIme (MAXIPIME) 2 g in sodium chloride 0.9 % 100 mL IVPB        2 g 200 mL/hr over 30 Minutes Intravenous Every T-Th-Sa (1800) 06/14/21 0922     06/15/21 1200  ceFEPIme (MAXIPIME) 2 g in sodium chloride 0.9 % 100 mL IVPB  Status:  Discontinued        2 g 200 mL/hr over 30 Minutes Intravenous Every T-Th-Sa (Hemodialysis) 06/14/21 0025 06/14/21 0922   06/15/21 1200  vancomycin (VANCOCIN) IVPB 1000 mg/200 mL premix        1,000 mg 200 mL/hr over 60 Minutes Intravenous Every T-Th-Sa (Hemodialysis) 06/14/21 0025     06/14/21 1000  metroNIDAZOLE (FLAGYL) IVPB 500 mg        500 mg 100 mL/hr over 60 Minutes Intravenous Every 12 hours 06/14/21 0025 06/21/21 0959   06/14/21 0030  fluconazole (DIFLUCAN) IVPB 400 mg        400 mg 100 mL/hr over 120 Minutes Intravenous STAT 06/14/21 0017 06/14/21 0400   06/13/21 2048  vancomycin variable dose per unstable renal function (pharmacist dosing)  Status:  Discontinued         Does not apply See admin instructions 06/13/21 2048 06/14/21 0030   06/13/21 2030  ceFEPIme (MAXIPIME) 2 g in sodium chloride 0.9 % 100 mL IVPB        2 g 200 mL/hr over 30 Minutes Intravenous  Once 06/13/21 2017 06/13/21 2145   06/13/21 2030  metroNIDAZOLE (FLAGYL) IVPB 500 mg        500 mg 100 mL/hr over 60 Minutes Intravenous  Once 06/13/21 2017 06/13/21 2157   06/13/21 2030  vancomycin  (VANCOREADY) IVPB 2000 mg/400 mL        2,000 mg 200 mL/hr over 120 Minutes Intravenous  Once 06/13/21 2017 06/14/21 0107         DVT prophylaxis: SCDs  Code Status: Full code  Family Communication: No family at bedside   Consultants: Urology  Procedures: Removal of infected penile prosthesis    Objective    Physical Examination:  General-appears in no acute distress Heart-S1-S2, regular, no murmur auscultated Lungs-clear to auscultation bilaterally, no wheezing or crackles auscultated  Abdomen-soft, nontender, no organomegaly Extremities-no edema in the lower extremities, s/p right BKA Neuro-alert, oriented x3, no focal deficit noted  Status is: Inpatient  Dispo: The patient is from: Home              Anticipated d/c is to: Home              Anticipated d/c date is: 06/17/2021              Patient currently not stable for discharge  Barrier to discharge-IV antibiotics for penile prosthetic infection  COVID-19 Labs  No results for input(s): DDIMER, FERRITIN, LDH, CRP in the last 72 hours.  Lab Results  Component Value Date   [place] NEGATIVE 06/13/2021   [place] NEGATIVE 07/20/2020   [place] NEGATIVE 05/31/2020            Recent Results (from the past 240 hour(s))  Blood Culture (routine x 2)     Status: None (Preliminary result)   Collection Time: 06/13/21  7:40 PM   Specimen: BLOOD  Result Value Ref Range Status   Specimen Description BLOOD RIGHT ANTECUBITAL  Final   Special Requests   Final    BOTTLES DRAWN AEROBIC AND ANAEROBIC Blood Culture results may not be optimal due to an inadequate volume of blood received in culture bottles   Culture   Final    NO GROWTH 2 DAYS Performed at [facility] [address] [place], [place] [address]    Report Status PENDING  Incomplete  Resp Panel by RT-PCR (Flu A&B, Covid) Nasopharyngeal Swab     Status: None   Collection Time: 06/13/21  8:17 PM   Specimen: Nasopharyngeal Swab;  Nasopharyngeal(NP) swabs in vial transport medium  Result Value Ref Range Status   SARS Coronavirus 2 by RT PCR NEGATIVE NEGATIVE Final    Comment: (NOTE) SARS-CoV-2 target nucleic acids are NOT DETECTED.  The SARS-CoV-2 RNA is generally detectable in upper respiratory specimens during the acute phase of infection. The lowest concentration of SARS-CoV-2 viral copies this assay can detect is 138 copies/mL. A negative result does not preclude SARS-Cov-2 infection and should not be used as the sole basis for treatment or other patient management decisions. A negative result may occur with  improper specimen collection/handling, submission of specimen other than nasopharyngeal swab, presence of viral mutation(s) within the areas targeted by this assay, and inadequate number of viral copies(<138 copies/mL). A negative result must be combined with clinical observations, patient history, and epidemiological information. The expected result is Negative.  Fact Sheet for Patients:  EntrepreneurPulse.com.au  Fact Sheet for Healthcare Providers:  IncredibleEmployment.be  This test is no t yet approved or cleared by the [place] FDA and  has been authorized for detection and/or diagnosis of SARS-CoV-2 by FDA under an Emergency Use Authorization (EUA). This EUA will remain  in effect (meaning this test can be used) for the duration of the COVID-19 declaration under Section 564(b)(1) of the Act, 21 U.S.C.section 360bbb-3(b)(1), unless the authorization is terminated  or revoked sooner.       Influenza A by PCR NEGATIVE NEGATIVE Final   Influenza B by PCR NEGATIVE NEGATIVE Final    Comment: (NOTE) The Xpert Xpress SARS-CoV-2/FLU/RSV plus assay is intended as an aid in the diagnosis of influenza from Nasopharyngeal swab specimens and should not be used as a sole basis for treatment. Nasal washings and aspirates are unacceptable for Xpert Xpress  SARS-CoV-2/FLU/RSV testing.  Fact Sheet for Patients: EntrepreneurPulse.com.au  Fact Sheet  for Healthcare Providers: IncredibleEmployment.be  This test is not yet approved or cleared by the [place] and has been authorized for detection and/or diagnosis of SARS-CoV-2 by FDA under an Emergency Use Authorization (EUA). This EUA will remain in effect (meaning this test can be used) for the duration of the COVID-19 declaration under Section 564(b)(1) of the Act, 21 U.S.C. section 360bbb-3(b)(1), unless the authorization is terminated or revoked.  Performed at [facility], [place] [address], [place], [place] [address]   Blood Culture (routine x 2)     Status: None (Preliminary result)   Collection Time: 06/13/21  8:50 PM   Specimen: BLOOD RIGHT HAND  Result Value Ref Range Status   Specimen Description BLOOD RIGHT HAND  Final   Special Requests   Final    BOTTLES DRAWN AEROBIC AND ANAEROBIC Blood Culture adequate volume   Culture   Final    NO GROWTH 2 DAYS Performed at [facility], [place] [address], [place], [place] [address]    Report Status PENDING  Incomplete  Urine Culture     Status: Abnormal (Preliminary result)   Collection Time: 06/14/21 12:20 PM   Specimen: In/Out Cath Urine  Result Value Ref Range Status   Specimen Description IN/OUT CATH URINE  Final   Special Requests NONE  Final   Culture (A)  Final    >=100,000 COLONIES/mL ENTEROCOCCUS FAECALIS SUSCEPTIBILITIES TO FOLLOW Performed at [facility], [place] [address] [place], [place] [address]    Report Status PENDING  Incomplete  Aerobic/Anaerobic Culture w Gram Stain (surgical/deep wound)     Status: None (Preliminary result)   Collection Time: 06/14/21  8:14 PM   Specimen: PATH GI Other; Tissue  Result Value Ref Range Status   Specimen Description WOUND PENIS  Final   Special Requests DISCHARGE  Final   Gram Stain   Final    NO ORGANISMS SEEN  SQUAMOUS EPITHELIAL CELLS PRESENT ABUNDANT WBC PRESENT,BOTH PMN AND MONONUCLEAR MODERATE GRAM POSITIVE COCCI Performed at [facility], [place] [address] [place], [place] [address]    Culture PENDING  Incomplete   Report Status PENDING  Incomplete  Surgical PCR screen     Status: Abnormal   Collection Time: 06/14/21 11:20 PM   Specimen: Nasal Mucosa; Nasal Swab  Result Value Ref Range Status   MRSA, PCR POSITIVE (A) NEGATIVE Final    Comment: RESULT CALLED TO, READ BACK BY AND VERIFIED WITH: RN CAREY D. 06/15/21@1 :09 BY TW    Staphylococcus aureus POSITIVE (A) NEGATIVE Final    Comment: (NOTE) The Xpert SA Assay (FDA approved for NASAL specimens in patients 3 years of age and older), is one component of a comprehensive surveillance program. It is not intended to diagnose infection nor to guide or monitor treatment. Performed at [facility], [place] [address], [place], [place] [address]     [facility] If 7PM-7AM, please contact night-coverage at [email or web address], Office  [phone]   06/15/2021, 7:27 PM  LOS: 1 day

## 2021-06-15 NOTE — Progress Notes (Signed)
[facility] Progress Note  Subjective: seen in room, no c/o  Vitals:   06/14/21 2259 06/15/21 0343 06/15/21 0600 06/15/21 0815  BP: 110/66 119/62  119/75  Pulse: 87 76  82  Resp: (!) 23 15  16   Temp: 98.5 F (36.9 C) 98.2 F (36.8 C)  98.2 F (36.8 C)  TempSrc: Oral Oral  Oral  SpO2: 97% 98%  97%  Weight: 112.4 kg  112.4 kg   Height: 6\' 2"  (1.88 m)       Exam:  alert, nad   no jvd  Chest cta bilat  Cor reg no RG  Abd soft ntnd no ascites   Ext R BKA, no edema, L foot w/ erythema and 2 ulcerated areas   Alert, NF, ox3    L AVF+bruit    OP HD: [facility] [place]   4h 19min  450/800  108kg  2/2 bath  AVF  Hep none  - Mircera 153mcg IV q 2 wks (last 9/27) - Hectoral 2mcg IV q HD   Assessment/Plan:  L foot infection: Xray concerning for osteomyelitis - on IV abx, fevers improving.   Penile prothesis infection: sp removal on 10/5 per urology.  ESRD: HD TTS. HD today.   HTN/volume: BP's low normal. No vol ^ on exam, up3-4kg  Anemia: Hgb 10.1 - not due for ESA yet  Metabolic bone disease: Ca ok, Phos pending. Continue home binders  Nutrition:  Alb low, starting protein supplements.  T2DM  PAD  CAD   Rob Nelly Scriven 06/15/2021, 12:26 PM     Recent Labs  Lab 06/14/21 0213 06/15/21 0120  K 4.1 3.8  BUN 24* 40*  CREATININE 4.28* 5.36*  CALCIUM 7.8* 8.1*  HGB 10.1* 10.1*   Inpatient medications:  atorvastatin  40 mg Oral QHS   carvedilol  37.5 mg Oral BID WC   Chlorhexidine Gluconate Cloth  6 each Topical Q0600   fentaNYL  1 patch Transdermal Q72H   insulin aspart  0-9 Units Subcutaneous Q4H   insulin glargine-yfgn  30 Units Subcutaneous QHS   mupirocin ointment  1 application Nasal BID   sodium chloride flush  3 mL Intravenous Q12H    sodium chloride     sodium chloride     ceFEPime (MAXIPIME) IV     fluconazole (DIFLUCAN) IV     metronidazole 500 mg (06/15/21 0955)   vancomycin     sodium chloride, sodium chloride, acetaminophen **OR**  acetaminophen, HYDROmorphone (DILAUDID) injection, lidocaine (PF), lidocaine-prilocaine, ondansetron **OR** ondansetron (ZOFRAN) IV, pentafluoroprop-tetrafluoroeth

## 2021-06-16 ENCOUNTER — Inpatient Hospital Stay (HOSPITAL_COMMUNITY): Payer: 59

## 2021-06-16 ENCOUNTER — Other Ambulatory Visit: Payer: Medicare Other | Admitting: Student

## 2021-06-16 ENCOUNTER — Telehealth: Payer: Self-pay | Admitting: Student

## 2021-06-16 LAB — BASIC METABOLIC PANEL
Anion gap: 15 (ref 5–15)
BUN: 32 mg/dL — ABNORMAL HIGH (ref 8–23)
CO2: 24 mmol/L (ref 22–32)
Calcium: 7.9 mg/dL — ABNORMAL LOW (ref 8.9–10.3)
Chloride: 96 mmol/L — ABNORMAL LOW (ref 98–111)
Creatinine, Ser: 4.19 mg/dL — ABNORMAL HIGH (ref 0.61–1.24)
GFR, Estimated: 15 mL/min — ABNORMAL LOW (ref >60)
Glucose, Bld: 193 mg/dL — ABNORMAL HIGH (ref 70–99)
Potassium: 3.4 mmol/L — ABNORMAL LOW (ref 3.5–5.1)
Sodium: 135 mmol/L (ref 135–145)

## 2021-06-16 LAB — GLUCOSE, CAPILLARY
Glucose-Capillary: 181 mg/dL — ABNORMAL HIGH (ref 70–99)
Glucose-Capillary: 169 mg/dL — ABNORMAL HIGH (ref 70–99)
Glucose-Capillary: 168 mg/dL — ABNORMAL HIGH (ref 70–99)
Glucose-Capillary: 118 mg/dL — ABNORMAL HIGH (ref 70–99)
Glucose-Capillary: 166 mg/dL — ABNORMAL HIGH (ref 70–99)
Glucose-Capillary: 172 mg/dL — ABNORMAL HIGH (ref 70–99)

## 2021-06-16 LAB — CBC
HCT: 29.7 % — ABNORMAL LOW (ref 39.0–52.0)
Hemoglobin: 9.6 g/dL — ABNORMAL LOW (ref 13.0–17.0)
MCH: 30 pg (ref 26.0–34.0)
MCHC: 32.3 g/dL (ref 30.0–36.0)
MCV: 92.8 fL (ref 80.0–100.0)
Platelets: 269 10*3/uL (ref 150–400)
RBC: 3.2 MIL/uL — ABNORMAL LOW (ref 4.22–5.81)
RDW: 13.2 % (ref 11.5–15.5)
WBC: 16.8 10*3/uL — ABNORMAL HIGH (ref 4.0–10.5)
nRBC: 0 % (ref 0.0–0.2)

## 2021-06-16 MED ORDER — HEPARIN (PORCINE) 25000 UT/250ML-% IV SOLN
1800.0000 [IU]/h | INTRAVENOUS | Status: DC
  Administered 2021-06-16: 1450 [IU]/h via INTRAVENOUS
  Administered 2021-06-18 (×2): 1700 [IU]/h via INTRAVENOUS
  Administered 2021-06-19 – 2021-06-20 (×2): 1800 [IU]/h via INTRAVENOUS
  Filled 2021-06-16 (×7): qty 250

## 2021-06-16 NOTE — Consult Note (Signed)
CARDIOLOGY CONSULT NOTE  Patient ID: [name] MRN: [id number] DOB/AGE: [birth year] 65 y.o.  Admit date: 06/13/2021 Referring Physician  Carney Harder Primary Physician:  [name], PA-C Reason for Consultation  PAD, A. Fib  Patient ID: [name], male    DOB: [birth year], 65 y.o.   MRN: [id number]  Chief Complaint  Patient presents with   Wound Infection   HPI:    [name]  is a 65 y.o. Caucasian male patient with severe peripheral arterial disease and has history of right below-knee amputation and toe lamputations on the left, multiple peripheral interventions in the past, uncontrolled diabetes mellitus, ESRD, severe diabetic retinopathy, coronary artery disease angioplasty to the right coronary artery on 10/29/2014, right carotid endarterectomy in 2012 , OSA Unable to tolerate CPAP, chronic back pain and neck and back surgery in past and on chronic pain medications, persistent atrial fibrillation diagnosed about a month ago and presently on Eliquis, chronic left foot ulceration, history of penile prosthesis for erectile dysfunction admitted with fever and sepsis, found to have infected penile prosthesis and also gas gangrene involving his left foot.  I was consulted for management of PAD.  States that since being the hospital he is feeling much better, the discharge from his groin site has improved significantly, he has not had any chills or fever.  No pain in his left leg ulceration site.  Past Medical History:  Diagnosis Date   Carotid artery occlusion 11/10/10   LEFT CAROTID ENDARTERECTOMY   Chronic kidney disease    Complication of anesthesia    BP WENT UP AT [facility] "   COPD (chronic obstructive pulmonary disease) ([place])    pt denies this dx as of 06/01/20 - no inhaler    Diabetes mellitus without complication ([place])    Diverticulitis    Diverticulosis of colon (without mention of hemorrhage)    DJD (degenerative joint disease)    knees/hands/feet/back/neck   Fatty  liver    Full dentures    GERD (gastroesophageal reflux disease)    H/O hiatal hernia    History of blood transfusion    with a past surical procedure per patient 06/01/20   Hyperlipidemia    Hypertension    Neuromuscular disorder ([place])    peripheral neuropathy   Non-pressure chronic ulcer of other part of left foot limited to breakdown of skin ([place]) 11/12/2016   Osteomyelitis ([facility])    left 5th metatarsal   PAD (peripheral artery disease) ([place])    Distal aortogram June 2012. Atherectomy left popliteal artery July 2012.    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   Unstable angina ([place]) 09/16/2018   Wears glasses    Past Surgical History:  Procedure Laterality Date   AMPUTATION  11/05/2011   Procedure: AMPUTATION RAY;  Surgeon: Wylene Simmer, MD;  Location: [place];  Service: Orthopedics;  Laterality: Right;  Amputation of Right 4&5th Toes   AMPUTATION Left 11/26/2012   Procedure: AMPUTATION RAY;  Surgeon: Wylene Simmer, MD;  Location: [place];  Service: Orthopedics;  Laterality: Left;  fourth ray amputation   AMPUTATION Right 08/27/2014   Procedure: Transmetatarsal Amputation;  Surgeon: Newt Minion, MD;  Location: [place];  Service: Orthopedics;  Laterality: Right;   AMPUTATION Right 01/14/2015   Procedure: AMPUTATION BELOW KNEE;  Surgeon: Newt Minion, MD;  Location: [place];  Service: Orthopedics;  Laterality: Right;   AMPUTATION Left 10/21/2015   Procedure: Left Foot 5th Ray Amputation;  Surgeon: Newt Minion, MD;  Location: [place];  Service: Orthopedics;  Laterality: Left;   ANTERIOR FUSION CERVICAL SPINE  02/06/06   C4-5, C5-6, C6-7; SURGEON DR. MAX COHEN   AV FISTULA PLACEMENT Left 06/02/2020   Procedure: ARTERIOVENOUS (AV) FISTULA CREATION LEFT;  Surgeon: Waynetta Sandy, MD;  Location: [place];  Service: Vascular;  Laterality: Left;   BACK SURGERY     x 3    Britt Left 07/21/2020   Procedure: LEFT UPPER ARM ATERIOVENOUS SUPERFISTULALIZATION;  Surgeon: Waynetta Sandy, MD;  Location: [place];  Service: Vascular;  Laterality: Left;   BELOW KNEE LEG AMPUTATION Right    CARDIAC CATHETERIZATION  10/31/04   2009   CAROTID ENDARTERECTOMY  11/10/10   CAROTID ENDARTERECTOMY Left 11/10/2010   Subtotal occlusion of left internal carotid artery with left hemispheric transient ischemic attacks.   CAROTID STENT     CARPAL TUNNEL RELEASE Right 10/21/2013   Procedure: RIGHT CARPAL TUNNEL RELEASE;  Surgeon: Wynonia Sours, MD;  Location: [place];  Service: Orthopedics;  Laterality: Right;   CHOLECYSTECTOMY     COLON SURGERY     COLONOSCOPY     COLOSTOMY REVERSAL  05/21/2018   ileostomy reversal   CYSTOSCOPY WITH STENT PLACEMENT Bilateral 01/13/2018   Procedure: CYSTOSCOPY WITH BILATERAL URETERAL CATHETER PLACEMENT;  Surgeon: Ardis Hughs, MD;  Location[facility];  Service: Urology;  Laterality: Bilateral;   ESOPHAGEAL MANOMETRY Bilateral 07/19/2014   Procedure: ESOPHAGEAL MANOMETRY (EM);  Surgeon: Jerene Bears, MD;  [facility];  Service: Gastroenterology;  Laterality: Bilateral;   EYE SURGERY Bilateral 2020   cataract   FEMORAL ARTERY STENT     x6   FINGER SURGERY     FOOT SURGERY  04/25/2016    EXCISION BASE 5TH METATARSAL AND PARTIAL CUBOID LEFT FOOT   HERNIA REPAIR     LEFT INGUINAL AND UMBILICAL REPAIRS   HERNIA REPAIR     I & D EXTREMITY Left 04/25/2016   Procedure: EXCISION BASE 5TH METATARSAL AND PARTIAL CUBOID LEFT FOOT;  Surgeon: Newt Minion, MD;  Location: [place];  Service: Orthopedics;  Laterality: Left;   ILEOSTOMY  01/13/2018   Procedure: ILEOSTOMY;  Surgeon: Clovis Riley, MD;  [facility];  Service: General;;   ILEOSTOMY CLOSURE N/A 05/21/2018   Procedure: ILEOSTOMY REVERSAL ERAS PATHWAY;  Surgeon: Clovis Riley, MD;  Location: [place];  Service: General;  Laterality: N/A;    IR RADIOLOGIST EVAL & MGMT  11/19/2017   IR RADIOLOGIST EVAL & MGMT  12/03/2017   IR RADIOLOGIST EVAL & MGMT  12/18/2017   JOINT REPLACEMENT Right 2001   Total knee   LAMINECTOMY     X 3 LUMBAR AND X 2 CERVICAL SPINE OPERATIONS   LAPAROSCOPIC CHOLECYSTECTOMY W/ CHOLANGIOGRAPHY  11/09/04   SURGEON DR. Robbins CATH AND CORONARY ANGIOGRAPHY N/A 09/16/2018   Procedure: LEFT HEART CATH AND CORONARY ANGIOGRAPHY;  Surgeon: Nigel Mormon, MD;  Location: [place] [facility];  Service: Cardiovascular;  Laterality: N/A;   LEFT HEART CATHETERIZATION WITH CORONARY ANGIOGRAM N/A 10/29/2014   Procedure: LEFT HEART CATHETERIZATION WITH CORONARY ANGIOGRAM;  Surgeon: Laverda Page, MD;  [facility];  Service: Cardiovascular;  Laterality: N/A;   LIGATION OF COMPETING BRANCHES OF ARTERIOVENOUS FISTULA Left 07/21/2020  Procedure: LIGATION OF COMPETING BRANCHES OF LEFT UPPER ARM ARTERIOVENOUS FISTULA;  Surgeon: Waynetta Sandy, MD;  Location: [place];  Service: Vascular;  Laterality: Left;   LOWER EXTREMITY ANGIOGRAM N/A 03/19/2012   Procedure: LOWER EXTREMITY ANGIOGRAM;  Surgeon: Burnell Blanks, MD;  Location: [facility] C[facility] LAB;  Service: Cardiovascular;  Laterality: N/A;   NECK SURGERY     PARTIAL COLECTOMY N/A 01/13/2018   Procedure: LAPAROSCOPIC ASSISTED   SIGMOID COLECTOMY ILEOSTOMY;  Surgeon: Clovis Riley, MD;  [facility];  Service: General;  Laterality: N/A;   PENILE PROSTHESIS IMPLANT  08/14/05   INFRAPUBIC INSERTION OF INFLATABLE PENILE PROSTHESIS; SURGEON DR. Amalia Hailey   PENILE PROSTHESIS IMPLANT     PERCUTANEOUS CORONARY STENT INTERVENTION (PCI-S) Right 10/29/2014   Procedure: PERCUTANEOUS CORONARY STENT INTERVENTION (PCI-S);  Surgeon: Laverda Page, MD;  Location: [facility] CATH LAB;  Service: Cardiovascular;  Laterality: Right;   REMOVAL OF PENILE PROSTHESIS N/A 06/14/2021   Procedure: Removal of THREE piece inflatable penile prosthesis;  Surgeon: Lucas Mallow, MD;  Location: [place];  Service: Urology;  Laterality: N/A;   SHOULDER ARTHROSCOPY     SPINE SURGERY     TOE AMPUTATION Left    TONSILLECTOMY     TOTAL KNEE ARTHROPLASTY  07/2002   RIGHT KNEE ; SURGEON  DR. [name] ALSO HAD ARTHROSCOPIC RIGHT KNEE IN  10/2001   TOTAL KNEE ARTHROPLASTY     ULNAR NERVE TRANSPOSITION Right 10/21/2013   Procedure: RIGHT ELBOW  ULNAR NERVE DECOMPRESSION;  Surgeon: Wynonia Sours, MD;  Location: [place];  Service: Orthopedics;  Laterality: Right;   Social History   Tobacco Use   Smoking status: Former    Packs/day: 2.00    Years: 35.00    Pack years: 70.00    Types: Cigarettes    Quit date: 10/28/2011    Years since quitting: 9.6   Smokeless tobacco: Never  Substance Use Topics   Alcohol use: Not Currently    Comment: "not in a long time"    Family History  Problem Relation Age of Onset   Heart disease Father        Before age 58-  CAD, BPG   Diabetes Father        Amputation   Cancer Father        PROSTATE   Hyperlipidemia Father    Hypertension Father    Heart attack Father        Triple BPG   Varicose Veins Father    Colon cancer Brother    Diabetes Brother    Heart disease Brother 4       A-Fib. Before age 72   Hyperlipidemia Brother    Hypertension Brother    Cancer Sister        Breast   Hyperlipidemia Sister    Hypertension Sister    Hypertension Son    Arthritis Other        GRANDMOTHER   Hypertension Other        OTHER FAMILY MEMBERS    Marital Status: Married  ROS  Review of Systems  Constitutional: Positive for malaise/fatigue and night sweats.  HENT: Negative.    Cardiovascular:  Positive for chest pain and dyspnea on exertion. Negative for claudication.  Respiratory:  Positive for snoring. Negative for wheezing.   Musculoskeletal:  Positive for back pain.  Gastrointestinal: Negative.   Psychiatric/Behavioral: Negative.    All other systems reviewed and are negative. Objective   Vitals with  BMI 06/16/2021 06/16/2021 06/16/2021  Height - - -  Weight - - -  BMI - - -  Systolic 546 270 350  Diastolic 69 58 71  Pulse 71 77 73    Blood pressure 140/69, pulse 71, temperature 98.3 F (36.8 C), temperature source Oral, resp. rate 18, height 6\' 2"  (1.88 m), weight 114.8 kg, SpO2 97 %.    Physical Exam Constitutional:      General: He is not in acute distress.    Appearance: He is obese.  HENT:     Head: Normocephalic.     Mouth/Throat:     Mouth: Mucous membranes are moist.  Neck:     Vascular: Carotid bruit (Bilateral) present.  Cardiovascular:     Rate and Rhythm: Normal rate. Rhythm irregular.     Pulses:          Dorsalis pedis pulses are 0 on the right side.       Posterior tibial pulses are 0 on the right side.     Heart sounds: No murmur heard.    Comments: Left foot ulceration noted covered in tape.  Swollen foot.  Chronic ischemic changes in the entire left leg below the knee.  Right below-knee amputation.  Stump is healthy. Abdominal:     General: Abdomen is flat. Bowel sounds are normal.     Palpations: Abdomen is soft.  Genitourinary:    Comments: Drainage tube from the pubic area noted.  Postsurgical Staples  in the suprapubic region noted. Musculoskeletal:        General: Deformity (Left foot with ulceration and partial to amputations from previous surgeries.  No foul-smelling discharge.) present.     Cervical back: Neck supple.  Neurological:     General: No focal deficit present.     Mental Status: He is alert and oriented to person, place, and time.   Laboratory examination:   Recent Labs    06/14/21 0213 06/15/21 0120 06/16/21 0214  NA 133* 134* 135  K 4.1 3.8 3.4*  CL 94* 96* 96*  CO2 27 24 24   GLUCOSE 392* 280* 193*  BUN 24* 40* 32*  CREATININE 4.28* 5.36* 4.19*  CALCIUM 7.8* 8.1* 7.9*  GFRNONAA 14* 11* 15*   estimated creatinine clearance is 23 mL/min (A) (by C-G formula based on SCr of 4.19 mg/dL (H)).  CMP Latest Ref Rng & Units  06/16/2021 06/15/2021 06/14/2021  Glucose 70 - 99 mg/dL 193(H) 280(H) 392(H)  BUN 8 - 23 mg/dL 32(H) 40(H) 24(H)  Creatinine 0.61 - 1.24 mg/dL 4.19(H) 5.36(H) 4.28(H)  Sodium 135 - 145 mmol/L 135 134(L) 133(L)  Potassium 3.5 - 5.1 mmol/L 3.4(L) 3.8 4.1  Chloride 98 - 111 mmol/L 96(L) 96(L) 94(L)  CO2 22 - 32 mmol/L 24 24 27   Calcium 8.9 - 10.3 mg/dL 7.9(L) 8.1(L) 7.8(L)  Total Protein 6.5 - 8.1 g/dL - - -  Total Bilirubin 0.3 - 1.2 mg/dL - - -  Alkaline Phos 38 - 126 U/L - - -  AST 15 - 41 U/L - - -  ALT 0 - 44 U/L - - -   CBC Latest Ref Rng & Units 06/16/2021 06/15/2021 06/14/2021  WBC 4.0 - 10.5 K/uL 16.8(H) 15.5(H) 15.2(H)  Hemoglobin 13.0 - 17.0 g/dL 9.6(L) 10.1(L) 10.1(L)  Hematocrit 39.0 - 52.0 % 29.7(L) 29.9(L) 30.6(L)  Platelets 150 - 400 K/uL 269 219 241   Lipid Panel No results for input(s): CHOL, TRIG, LDLCALC, VLDL, HDL, CHOLHDL, LDLDIRECT in the last 8760 hours.  HEMOGLOBIN A1C Lab Results  Component Value Date   HGBA1C 7.9 (H) 06/14/2021   MPG 180.03 06/14/2021   TSH No results for input(s): TSH in the last 8760 hours. BNP (last 3 results)  Medications and allergies   Allergies  Allergen Reactions   Contrast Media [Iodinated Diagnostic Agents]     Difficulty breathing     Ivp Dye [Iodinated Diagnostic Agents] Anaphylaxis and Other (See Comments)    Breathing problems, altered mental state    Adhesive [Tape] Rash    Rash after 3-4 days use     Current Meds  Medication Sig   amLODipine (NORVASC) 5 MG tablet Take 5 mg by mouth daily.   apixaban (ELIQUIS) 5 MG TABS tablet Take 5 mg by mouth See admin instructions. Only take on non-dialysis days Monday,Wednesday,Friday,sunday   atorvastatin (LIPITOR) 40 MG tablet Take 40 mg by mouth at bedtime.    BIDIL 20-37.5 MG tablet Take 1 tablet by mouth See admin instructions. Take on Monday,Wednesday,Friday,sunday   carvedilol (COREG) 25 MG tablet Take 1.5 tablets (37.5 mg total) by mouth 2 (two) times daily. (Patient  taking differently: Take 12.5 mg by mouth 2 (two) times daily.)   famotidine (PEPCID) 40 MG tablet Take 40 mg by mouth daily.   fentaNYL (DURAGESIC - DOSED MCG/HR) 50 MCG/HR Place 50 mcg onto the skin every 3 (three) days.   folic acid-vitamin b complex-vitamin c-selenium-zinc (DIALYVITE) 3 MG TABS tablet Take 1 tablet by mouth daily.   Glucosamine HCl (GLUCOSAMINE PO) Take 100 tablets by mouth 2 (two) times daily.   HUMALOG KWIKPEN 100 UNIT/ML KiwkPen Inject 13 Units into the skin 3 (three) times daily.   insulin glargine (LANTUS) 100 UNIT/ML injection Inject 55 Units into the skin at bedtime.   ketoconazole (NIZORAL) 2 % cream Apply 1 application topically 2 (two) times daily as needed for irritation.    lidocaine-prilocaine (EMLA) cream Dialysis days Tuesday,Thursday and saturday   linaclotide (LINZESS) 145 MCG CAPS capsule Take 145 mcg by mouth daily as needed (constipation).    loratadine (CLARITIN) 10 MG tablet Take 10 mg by mouth daily.   NEEDLE, REUSABLE, 22 G 22G X 1-1/2" MISC 1 Units by Does not apply route as directed. For B12 IM inj   omega-3 acid ethyl esters (LOVAZA) 1 g capsule Take 2 g by mouth 2 (two) times daily.   ondansetron (ZOFRAN) 4 MG tablet Take 4 mg by mouth every 6 (six) hours as needed for nausea or vomiting.   oxyCODONE-acetaminophen (PERCOCET) 7.5-325 MG tablet Take 1 tablet by mouth 3 (three) times daily as needed for moderate pain.   pantoprazole (PROTONIX) 40 MG tablet Take 40 mg by mouth daily.   Polyvinyl Alcohol-Povidone PF 1.4-0.6 % SOLN Place 1 drop into both eyes 2 (two) times daily as needed (dry eyes).    pregabalin (LYRICA) 100 MG capsule Take 1 capsule (100 mg total) by mouth 3 (three) times daily.   rOPINIRole (REQUIP) 2 MG tablet Take 2 mg by mouth at bedtime.   sulfamethoxazole-trimethoprim (BACTRIM DS) 800-160 MG tablet Take 1 tablet by mouth 2 (two) times daily. (Patient taking differently: Take 1 tablet by mouth See admin instructions. Bid x 10  days)   Syringe/Needle, Disp, (SYRINGE 3CC/22GX1-1/2") 22G X 1-1/2" 3 ML MISC 1 Syringe by Does not apply route as directed. For b12 IM inj   tamsulosin (FLOMAX) 0.4 MG CAPS capsule Take 0.4 mg by mouth daily.   torsemide (DEMADEX) 20 MG tablet Take 20 mg by mouth  in the morning and at bedtime. Take on extra for swelling    Scheduled Meds:  atorvastatin  40 mg Oral QHS   carvedilol  37.5 mg Oral BID WC   Chlorhexidine Gluconate Cloth  6 each Topical Q0600   fentaNYL  1 patch Transdermal Q72H   insulin aspart  0-9 Units Subcutaneous Q4H   insulin glargine-yfgn  50 Units Subcutaneous QHS   mupirocin ointment  1 application Nasal BID   sodium chloride flush  3 mL Intravenous Q12H   Continuous Infusions:  ceFEPime (MAXIPIME) IV 2 g (06/15/21 1827)   fluconazole (DIFLUCAN) IV 200 mg (06/15/21 2109)   heparin     metronidazole 500 mg (06/16/21 0904)   vancomycin Stopped (06/15/21 1711)   PRN Meds:.acetaminophen **OR** acetaminophen, HYDROmorphone (DILAUDID) injection, ondansetron **OR** ondansetron (ZOFRAN) IV   I/O last 3 completed shifts: In: 1120 [P.O.:370; I.V.:500; IV Piggyback:250] Out: 2395 [Urine:200; Drains:55; RXVQM:0867; Blood:25] Total I/O In: 55 [P.O.:480] Out: 200 [Urine:200]   Radiology:   MR FOOT LEFT WO CONTRAST  Result Date: 06/16/2021 CLINICAL DATA:  Chronic diabetic foot wound. EXAM: MRI OF THE LEFT FOOT WITHOUT CONTRAST TECHNIQUE: Multiplanar, multisequence MR imaging of the left foot was performed. No intravenous contrast was administered. COMPARISON:  Radiographs 06/13/2021 FINDINGS: There is a lateral and plantar foot wound extending right down close to the cuboid and base of the fifth metatarsal. Small locules of gas are suspected. I do not see a discrete fluid collection to suggest a drainable abscess but there are changes of cellulitis involving the foot and ankle. Myofasciitis mainly involving the short flexor muscles of the foot without definite findings  for pyomyositis. Abnormal T2 signal intensity in the cuboid is worrisome for osteomyelitis. I do not see any other sites suspicious for osteomyelitis or septic arthritis. Moderate degenerative changes throughout the foot. Markedly thickened peroneal tendons possibly suggesting chronic tendinopathy. Hindfoot varus noted. IMPRESSION: 1. Lateral and plantar foot wound extending down close to the cuboid and base of the fifth metatarsal. Small locules of gas are suspected. 2. Abnormal T2 signal intensity in the cuboid worrisome for osteomyelitis. 3. Cellulitis and myofasciitis without focal drainable soft tissue abscess or pyomyositis. Electronically Signed   By: Marijo Sanes M.D.   On: 06/16/2021 08:22    Cardiac Studies:   Left popliteal artery atherectomy in June 2012 and left below knee PTA 2014 by Dr. Brunetta Jeans at [place].    Lexiscan sestamibi stress test 03/18/2017: 1. The resting electrocardiogram demonstrated normal sinus rhythm, RBBB and no resting arrhythmias. Stress EKG is non-diagnostic for ischemia as it a pharmacologic stress using Lexiscan. Occasional PVC noted. Stress symptoms included dyspnea, dizziness and chest pain. 2. The LV is dilated both at rest and stress images. The LV end diastolic volume was 619JK. Perfusion images reveal inferior soft tissue attenuation artifact. There is no ischemia or scar. Overall left ventricular systolic function was normal without regional wall motion abnormalities. The left ventricular ejection fraction was calculated to be 51%. This is a low risk study. Compared to the study done on 10/15/2014, inferior wall ischemia and associated inferior hypokinesis is no longer present.   Coronary angiogram 09/17/18: Widely patent stent stents. Normal LVEF. Coronary Angiogram 10/29/2014: CAD s/p stenting of the distal, mid and proximal RCA with implantation of 3 overlapping drug-eluting stent, from distal to proximal 2.5 x 38 mm, 2.75 x 38 mm and a 2.75 x 20 mm Promus premier  DES.    Echocardiogram 09/16/2018:   Left ventricle: The cavity  size was normal. There was moderate concentric hypertrophy. Systolic function was normal. The estimated ejection fraction was in the range of 55% to 60%. Features are consistent with a pseudonormal left ventricular filling pattern, with concomitant abnormal relaxation and increased filling pressure (grade 2 diastolic dysfunction). Doppler parameters are consistent with both elevated ventricular end-diastolic filling pressure and elevated left atrial filling pressure. Mitral valve: Calcified annulus.  Left atrium: The atrium was mildly dilated. Inferior vena cava: The vessel was dilated. The respirophasic diameter changes was normal, may suggest elevated central venous pressure.   Carotid artery duplex  06/09/2019:  Minimal stenosis in bilateral ICA of 1-15%. Heterogeneous plaque. Stenosis  in the right external carotid artery (<50%).  Left carotid endarterectomy site is patent.  Right vertebral artery flow is not well visualized. Antegrade left  vertebral artery flow.  No significant change from 03/01/2017. Follow up studies when clinically  Indicated.   Lower extremity arterial duplex 08/19/2020: Right:  BKA.  Left: Monophasic waveforms throughout the left SFA and in small vessels.  The left toe-brachial index is abnormal.  No obvious stenosis identified from the common femoral artery to the  popliteal artery. TBI 0.58   EKG:  EKG 06/13/2021: Atrial fibrillation with rapid ventricular response at rate of 126 bpm, left axis deviation, left anterior fascicular block.  Right bundle branch block.  Poor R wave progression, cannot exclude anterolateral infarct old.  Nonspecific T abnormality.  Assessment   [name] is a 65 y.o. Caucasian male patient with severe peripheral arterial disease and has history of right below-knee amputation and toe lamputations on the left, multiple peripheral interventions in the past, uncontrolled  diabetes mellitus, ESRD, severe diabetic retinopathy, coronary artery disease angioplasty to the right coronary artery on 10/29/2014, right carotid endarterectomy in 2012 , OSA Unable to tolerate CPAP, chronic back pain and neck and back surgery in past and on chronic pain medications, persistent atrial fibrillation diagnosed about a month ago and presently on Eliquis, chronic left foot ulceration, history of penile prosthesis for erectile dysfunction admitted with fever and sepsis, found to have infected penile prosthesis and also gas gangrene involving his left foot.  I was consulted for management of PAD.  1.  Peripheral arterial disease 2.  Diabetes mellitus with diabetic foot ulcer left leg involving the bone and with gas gangrene. 3.  Coronary artery disease of the native vessel without angina pectoris 4.  End-stage renal disease on hemodialysis 5.  Persistent atrial fibrillation CHA2DS2-VASc Score is 4.  Yearly risk of stroke: 4.8% (A, HTN, DM, Vasc Dz).  Score of 1=0.6; 2=2.2; 3=3.2; 4=4.8; 5=7.2; 6=9.8; 7=>9.8) -(CHF; HTN; vasc disease DM,  Male = 1; Age <65 =0; 65-74 = 1,  >75 =2; stroke/embolism= 2).  6.  Tobacco use disorder and nicotine dependence severe, patient uses vapes.  Recommendations:   It is unfortunate that he has progressed significantly with regard to PAD.  This is a competent of both PAD and diabetic foot ulcer.  I am concerned for wound healing/amputation stump healing as he had monophasic waveforms throughout the lower extremity suggestive of SFA disease as well.  As he is presently septic, once he becomes afebrile, we will plan on peripheral arteriogram on Tuesday there is 06/20/2021 with hopes of revascularizing his lower extremities prior to amputation.  Eliquis is presently on hold, when appropriate by surgery would recommend either starting heparin full dose or starting back on Eliquis, prefer heparin as he will need peripheral arteriogram soon.  With regard to  coronary artery disease, he has remained stable without recurrence of angina or any heart failure signs.  Continue present medications, no specific recommendation from cardiology for now, atrial fibrillation is rate controlled on present medical regimen.  Unfortunate that he still continues to use nicotine, diabetes continues to be uncontrolled.  I will obtain ABI.  In view of new onset A. fib, I will also obtain an echocardiogram.   Adrian Prows, MD, [facility] 06/16/2021, 5:57 PM Office: [phone]

## 2021-06-16 NOTE — Progress Notes (Signed)
Urology Inpatient Progress Report  Sepsis, due to unspecified organism, unspecified whether acute organ dysfunction present ([place]) [A41.9] Severe sepsis with lactic acidosis ([place]) [A41.9, R65.20, E87.20]  Procedure(s): Removal of THREE piece inflatable penile prosthesis  2 Days Post-Op   Intv/Subj: No acute events overnight. Patient is without complaint.  Pain and discomfort are much improved.  10 cc out on each drain last shift.  Wound culture growing some Enterococcus and group B strep.  Urine culture positive for Enterococcus.  Principal Problem:   Severe sepsis with lactic acidosis (HCC) Active Problems:   CAD (dz of distal, mid and proximal RCA with implantation of 3 overlapping drug-eluting stent,)   Insulin dependent type 2 diabetes mellitus (HCC)   Chronic venous hypertension (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 ([place])   Peripheral artery disease ([place])   Infection associated with implanted penile prosthesis ([place])   ESRD on hemodialysis ([place])   Paroxysmal atrial fibrillation with RVR ([place])  Current Facility-Administered Medications  Medication Dose Route Frequency Provider Last Rate Last Admin   acetaminophen (TYLENOL) tablet 1,000 mg  1,000 mg Oral Q6H PRN Zada Finders R, MD   1,000 mg at 06/14/21 1802   Or   acetaminophen (TYLENOL) suppository 650 mg  650 mg Rectal Q6H PRN Lenore Cordia, MD       atorvastatin (LIPITOR) tablet 40 mg  40 mg Oral QHS Zada Finders R, MD   40 mg at 06/15/21 2110   carvedilol (COREG) tablet 37.5 mg  37.5 mg Oral BID WC Zada Finders R, MD   37.5 mg at 06/16/21 0857   ceFEPIme (MAXIPIME) 2 g in sodium chloride 0.9 % 100 mL IVPB  2 g Intravenous Q T,Th,Sat-1800 von Dohlen, Haley B, RPH 200 mL/hr at 06/15/21 1827 2 g at 06/15/21 1827   Chlorhexidine Gluconate Cloth 2 % PADS 6 each  6 each Topical Q0600 Loren Racer, PA-C   6 each at 06/16/21 2440   fentaNYL (DURAGESIC) 50 MCG/HR 1 patch  1 patch Transdermal Q72H  Chotiner, Yevonne Aline, MD   1 patch at 06/16/21 0404   fluconazole (DIFLUCAN) IVPB 200 mg  200 mg Intravenous QHS Franky Macho, RPH 100 mL/hr at 06/15/21 2109 200 mg at 06/15/21 2109   HYDROmorphone (DILAUDID) injection 1 mg  1 mg Intravenous Q4H PRN Zada Finders R, MD   1 mg at 06/16/21 1027   insulin aspart (novoLOG) injection 0-9 Units  0-9 Units Subcutaneous Q4H Lenore Cordia, MD   2 Units at 06/16/21 0905   insulin glargine-yfgn (SEMGLEE) injection 50 Units  50 Units Subcutaneous QHS Oswald Hillock, MD   50 Units at 06/15/21 2135   metroNIDAZOLE (FLAGYL) IVPB 500 mg  500 mg Intravenous Q12H Zada Finders R, MD 100 mL/hr at 06/16/21 0904 500 mg at 06/16/21 0904   mupirocin ointment (BACTROBAN) 2 % 1 application  1 application Nasal BID Chotiner, Yevonne Aline, MD   1 application at 25/36/64 0905   ondansetron (ZOFRAN) tablet 4 mg  4 mg Oral Q6H PRN Lenore Cordia, MD       Or   ondansetron (ZOFRAN) injection 4 mg  4 mg Intravenous Q6H PRN Zada Finders R, MD       sodium chloride flush (NS) 0.9 % injection 3 mL  3 mL Intravenous Q12H Zada Finders R, MD   3 mL at 06/16/21 0905   vancomycin (VANCOCIN) IVPB 1000 mg/200 mL premix  1,000 mg Intravenous Q T,Th,Sa-HD Franky Macho,  [place]   Stopped at 06/15/21 1711     Objective: Vital: Vitals:   06/15/21 2315 06/16/21 0411 06/16/21 0830 06/16/21 1211  BP: (!) 152/83 (!) 142/60 124/71 (!) 108/58  Pulse: 90 89 73 77  Resp: 19 14 16 18   Temp: 98.1 F (36.7 C) 98.2 F (36.8 C) 97.8 F (36.6 C) 97.8 F (36.6 C)  TempSrc: Oral Oral Oral Oral  SpO2: 98% 96% 99% 98%  Weight:      Height:       I/Os: I/O last 3 completed shifts: In: 1120 [P.O.:370; I.V.:500; IV Piggyback:250] Out: 2395 [Urine:200; Drains:55; AQTMA:2633; Blood:25]  Physical Exam:  General: Patient is in no apparent distress Lungs: Normal respiratory effort, chest expands symmetrically. GI: The abdomen is soft and nontender without mass. JP drain with minimal serosanguinous  drainage Foley: Draining clear yellow urine Genitourinary: Patient has some mild penile edema.  He states he is circumcised.  Scrotum with some erythema but no fluctuance or crepitus.  Some mild induration in the right anterior scrotum.  Dressing removed from staple incision line and this was clean dry and intact.  No purulence and only mild erythema.  Bilateral JP drains in place with small amount of drainage. Ext: lower extremities symmetric  Lab Results: Recent Labs    06/14/21 0213 06/15/21 0120 06/16/21 0214  WBC 15.2* 15.5* 16.8*  HGB 10.1* 10.1* 9.6*  HCT 30.6* 29.9* 29.7*   Recent Labs    06/14/21 0213 06/15/21 0120 06/16/21 0214  NA 133* 134* 135  K 4.1 3.8 3.4*  CL 94* 96* 96*  CO2 27 24 24   GLUCOSE 392* 280* 193*  BUN 24* 40* 32*  CREATININE 4.28* 5.36* 4.19*  CALCIUM 7.8* 8.1* 7.9*   Recent Labs    06/13/21 2017 06/14/21 0213  INR 1.5* 1.5*   No results for input(s): LABURIN in the last 72 hours. Results for orders placed or performed during the hospital encounter of 06/13/21  Blood Culture (routine x 2)     Status: None (Preliminary result)   Collection Time: 06/13/21  7:40 PM   Specimen: BLOOD  Result Value Ref Range Status   Specimen Description BLOOD RIGHT ANTECUBITAL  Final   Special Requests   Final    BOTTLES DRAWN AEROBIC AND ANAEROBIC Blood Culture results may not be optimal due to an inadequate volume of blood received in culture bottles   Culture   Final    NO GROWTH 3 DAYS Performed at [facility], [place] [address], [place], [place] [address]    Report Status PENDING  Incomplete  Resp Panel by RT-PCR (Flu A&B, Covid) Nasopharyngeal Swab     Status: None   Collection Time: 06/13/21  8:17 PM   Specimen: Nasopharyngeal Swab; Nasopharyngeal(NP) swabs in vial transport medium  Result Value Ref Range Status   SARS Coronavirus 2 by RT PCR NEGATIVE NEGATIVE Final    Comment: (NOTE) SARS-CoV-2 target nucleic acids are NOT DETECTED.  The  SARS-CoV-2 RNA is generally detectable in upper respiratory specimens during the acute phase of infection. The lowest concentration of SARS-CoV-2 viral copies this assay can detect is 138 copies/mL. A negative result does not preclude SARS-Cov-2 infection and should not be used as the sole basis for treatment or other patient management decisions. A negative result may occur with  improper specimen collection/handling, submission of specimen other than nasopharyngeal swab, presence of viral mutation(s) within the areas targeted by this assay, and inadequate number of viral copies(<138 copies/mL). A negative  result must be combined with clinical observations, patient history, and epidemiological information. The expected result is Negative.  Fact Sheet for Patients:  EntrepreneurPulse.com.au  Fact Sheet for Healthcare Providers:  IncredibleEmployment.be  This test is no t yet approved or cleared by the [place] FDA and  has been authorized for detection and/or diagnosis of SARS-CoV-2 by FDA under an Emergency Use Authorization (EUA). This EUA will remain  in effect (meaning this test can be used) for the duration of the COVID-19 declaration under Section 564(b)(1) of the Act, 21 U.S.C.section 360bbb-3(b)(1), unless the authorization is terminated  or revoked sooner.       Influenza A by PCR NEGATIVE NEGATIVE Final   Influenza B by PCR NEGATIVE NEGATIVE Final    Comment: (NOTE) The Xpert Xpress SARS-CoV-2/FLU/RSV plus assay is intended as an aid in the diagnosis of influenza from Nasopharyngeal swab specimens and should not be used as a sole basis for treatment. Nasal washings and aspirates are unacceptable for Xpert Xpress SARS-CoV-2/FLU/RSV testing.  Fact Sheet for Patients: EntrepreneurPulse.com.au  Fact Sheet for Healthcare Providers: IncredibleEmployment.be  This test is not yet approved or  cleared by the [place] FDA and has been authorized for detection and/or diagnosis of SARS-CoV-2 by FDA under an Emergency Use Authorization (EUA). This EUA will remain in effect (meaning this test can be used) for the duration of the COVID-19 declaration under Section 564(b)(1) of the Act, 21 U.S.C. section 360bbb-3(b)(1), unless the authorization is terminated or revoked.  Performed at [facility], [place] [address], [place], [place] [address]   Blood Culture (routine x 2)     Status: None (Preliminary result)   Collection Time: 06/13/21  8:50 PM   Specimen: BLOOD RIGHT HAND  Result Value Ref Range Status   Specimen Description BLOOD RIGHT HAND  Final   Special Requests   Final    BOTTLES DRAWN AEROBIC AND ANAEROBIC Blood Culture adequate volume   Culture   Final    NO GROWTH 3 DAYS Performed at [facility], [place] [address], [place], [place] [address]    Report Status PENDING  Incomplete  Urine Culture     Status: Abnormal (Preliminary result)   Collection Time: 06/14/21 12:20 PM   Specimen: In/Out Cath Urine  Result Value Ref Range Status   Specimen Description IN/OUT CATH URINE  Final   Special Requests NONE  Final   Culture (A)  Final    >=100,000 COLONIES/mL ENTEROCOCCUS FAECALIS SUSCEPTIBILITIES TO FOLLOW Performed at [facility], [place] [address] [place], [place] [address]    Report Status PENDING  Incomplete  Aerobic/Anaerobic Culture w Gram Stain (surgical/deep wound)     Status: None (Preliminary result)   Collection Time: 06/14/21  8:14 PM   Specimen: PATH GI Other; Tissue  Result Value Ref Range Status   Specimen Description WOUND PENIS  Final   Special Requests DISCHARGE  Final   Gram Stain   Final    NO ORGANISMS SEEN SQUAMOUS EPITHELIAL CELLS PRESENT ABUNDANT WBC PRESENT,BOTH PMN AND MONONUCLEAR MODERATE GRAM POSITIVE COCCI    Culture   Final    FEW ENTEROCOCCUS FAECALIS FEW GROUP B STREP(S.AGALACTIAE)ISOLATED TESTING AGAINST S.  AGALACTIAE NOT ROUTINELY PERFORMED DUE TO PREDICTABILITY OF AMP/PEN/VAN SUSCEPTIBILITY. CULTURE REINCUBATED FOR BETTER GROWTH Performed at [facility], [place] [address] [place], [place] [address]    Report Status PENDING  Incomplete  Surgical PCR screen     Status: Abnormal   Collection Time: 06/14/21  11:20 PM   Specimen: Nasal Mucosa; Nasal Swab  Result Value Ref Range Status   MRSA, PCR POSITIVE (A) NEGATIVE Final    Comment: RESULT CALLED TO, READ BACK BY AND VERIFIED WITH: RN CAREY D. 06/15/21@1 :09 BY TW    Staphylococcus aureus POSITIVE (A) NEGATIVE Final    Comment: (NOTE) The Xpert SA Assay (FDA approved for NASAL specimens in patients 43 years of age and older), is one component of a comprehensive surveillance program. It is not intended to diagnose infection nor to guide or monitor treatment. Performed at [facility], [place] [address], [place], [place] [address]     Studies/Results: MR FOOT LEFT WO CONTRAST  Result Date: 06/16/2021 CLINICAL DATA:  Chronic diabetic foot wound. EXAM: MRI OF THE LEFT FOOT WITHOUT CONTRAST TECHNIQUE: Multiplanar, multisequence MR imaging of the left foot was performed. No intravenous contrast was administered. COMPARISON:  Radiographs 06/13/2021 FINDINGS: There is a lateral and plantar foot wound extending right down close to the cuboid and base of the fifth metatarsal. Small locules of gas are suspected. I do not see a discrete fluid collection to suggest a drainable abscess but there are changes of cellulitis involving the foot and ankle. Myofasciitis mainly involving the short flexor muscles of the foot without definite findings for pyomyositis. Abnormal T2 signal intensity in the cuboid is worrisome for osteomyelitis. I do not see any other sites suspicious for osteomyelitis or septic arthritis. Moderate degenerative changes throughout the foot. Markedly thickened peroneal tendons possibly suggesting chronic tendinopathy. Hindfoot varus  noted. IMPRESSION: 1. Lateral and plantar foot wound extending down close to the cuboid and base of the fifth metatarsal. Small locules of gas are suspected. 2. Abnormal T2 signal intensity in the cuboid worrisome for osteomyelitis. 3. Cellulitis and myofasciitis without focal drainable soft tissue abscess or pyomyositis. Electronically Signed   By: Marijo Sanes M.D.   On: 06/16/2021 08:22    Assessment: Infected inflatable penile prosthesis  Procedure(s): Removal of THREE piece inflatable penile prosthesis, 2 Days Post-Op  doing well.  Plan: Urine culture and wound culture is growing Enterococcus.  I think this is most likely the offending organism rather than anything fungal.  Okay from my standpoint to discontinue fluconazole but continue cefepime until susceptibilities return.  Continue JP drains for now.  We can likely remove these in a day or 2.  I would like for him to keep the Foley catheter for about 7 to 10 days prior to consideration of removal.  Certainly possible he may have had erosion of the device into his urethra and this needs to heal.  Okay from my standpoint to resume Eliquis.   Link Snuffer, MD Urology 06/16/2021, 4:06 PM

## 2021-06-16 NOTE — Progress Notes (Signed)
[place] [phone] Manufacturing engineer [facility]) Hospital Liaison note:  This is a pending outpatient-based Palliative Care patient. Will continue to follow for disposition.  Please call with any outpatient palliative questions or concerns.  Thank you, Lorelee Market, LPN [facility] Liaison [phone]

## 2021-06-16 NOTE — Telephone Encounter (Signed)
Patient was to have initial palliative consult today. Wife called, stating he is in hospital. Will have appointment reschedule upon his discharge.

## 2021-06-16 NOTE — Progress Notes (Signed)
ANTICOAGULATION CONSULT NOTE - Initial Consult  Pharmacy Consult for heparin Indication: atrial fibrillation  Allergies  Allergen Reactions   Contrast Media [Iodinated Diagnostic Agents]     Difficulty breathing     Ivp Dye [Iodinated Diagnostic Agents] Anaphylaxis and Other (See Comments)    Breathing problems, altered mental state    Adhesive [Tape] Rash    Rash after 3-4 days use    Patient Measurements: Height: 6\' 2"  (188 cm) Weight: 114.8 kg (253 lb 1.4 oz) IBW/kg (Calculated) : 82.2 Heparin Dosing Weight: 105kg  Vital Signs: Temp: 97.8 F (36.6 C) (10/07 1211) Temp Source: Oral (10/07 1211) BP: 108/58 (10/07 1211) Pulse Rate: 77 (10/07 1211)  Labs: Recent Labs    06/13/21 2017 06/14/21 0213 06/15/21 0120 06/16/21 0214  HGB 12.1* 10.1* 10.1* 9.6*  HCT 36.8* 30.6* 29.9* 29.7*  PLT 358 241 219 269  APTT 36  --   --   --   LABPROT 18.0* 17.9*  --   --   INR 1.5* 1.5*  --   --   CREATININE 4.23* 4.28* 5.36* 4.19*    Estimated Creatinine Clearance: 23 mL/min (A) (by C-G formula based on SCr of 4.19 mg/dL (H)).   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   LEFT CAROTID ENDARTERECTOMY   Chronic kidney disease    Complication of anesthesia    BP WENT UP AT [facility] "   COPD (chronic obstructive pulmonary disease) ([place])    pt denies this dx as of 06/01/20 - no inhaler    Diabetes mellitus without complication ([place])    Diverticulitis    Diverticulosis of colon (without mention of hemorrhage)    DJD (degenerative joint disease)    knees/hands/feet/back/neck   Fatty liver    Full dentures    GERD (gastroesophageal reflux disease)    H/O hiatal hernia    History of blood transfusion    with a past surical procedure per patient 06/01/20   Hyperlipidemia    Hypertension    Neuromuscular disorder ([place])    peripheral neuropathy   Non-pressure chronic ulcer of other part of left foot limited to breakdown of skin  ([place]) 11/12/2016   Osteomyelitis (HCC)    left 5th metatarsal   PAD (peripheral artery disease) ([place])    Distal aortogram June 2012. Atherectomy left popliteal artery July 2012.    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   06/13/2021   apixaban (ELIQUIS) 5 MG TABS tablet Take 5 mg by mouth See admin instructions. Only take on non-dialysis days Monday,Wednesday,Friday,sunday   06/12/2021 at 2000   atorvastatin (LIPITOR) 40 MG tablet Take 40 mg by mouth at bedtime.    06/12/2021   BIDIL 20-37.5 MG tablet Take 1 tablet by mouth See admin instructions. Take on Monday,Wednesday,Friday,sunday   06/12/2021   carvedilol (COREG) 25 MG tablet Take 1.5 tablets (37.5 mg total) by mouth 2 (two) times daily. (Patient taking differently: Take 12.5 mg by mouth 2 (two) times daily.) 270 tablet 2 06/13/2021 at 0800   famotidine (PEPCID) 40 MG tablet Take  40 mg by mouth daily.   06/13/2021   fentaNYL (DURAGESIC - DOSED MCG/HR) 50 MCG/HR Place 50 mcg onto the skin every 3 (three) days.   99/04/3381   folic acid-vitamin b complex-vitamin c-selenium-zinc (DIALYVITE) 3 MG TABS tablet Take 1 tablet by mouth daily.   06/13/2021   Glucosamine HCl (GLUCOSAMINE PO) Take 100 tablets by mouth 2 (two) times daily.   06/12/2021   HUMALOG KWIKPEN 100 UNIT/ML KiwkPen Inject 13 Units into the skin 3 (three) times daily.  0 06/13/2021   insulin glargine (LANTUS) 100 UNIT/ML injection Inject 55 Units into the skin at bedtime.   06/12/2021   ketoconazole (NIZORAL) 2 % cream Apply 1 application topically 2 (two) times daily as needed for irritation.   1 Past Month   lidocaine-prilocaine (EMLA) cream Dialysis days  Tuesday,Thursday and saturday   06/12/2021   linaclotide (LINZESS) 145 MCG CAPS capsule Take 145 mcg by mouth daily as needed (constipation).    unk   loratadine (CLARITIN) 10 MG tablet Take 10 mg by mouth daily.   06/13/2021   NEEDLE, REUSABLE, 22 G 22G X 1-1/2" MISC 1 Units by Does not apply route as directed. For B12 IM inj 10 each 0    omega-3 acid ethyl esters (LOVAZA) 1 g capsule Take 2 g by mouth 2 (two) times daily.   06/13/2021   ondansetron (ZOFRAN) 4 MG tablet Take 4 mg by mouth every 6 (six) hours as needed for nausea or vomiting.   unk   oxyCODONE-acetaminophen (PERCOCET) 7.5-325 MG tablet Take 1 tablet by mouth 3 (three) times daily as needed for moderate pain.   06/13/2021   pantoprazole (PROTONIX) 40 MG tablet Take 40 mg by mouth daily.   06/13/2021   Polyvinyl Alcohol-Povidone PF 1.4-0.6 % SOLN Place 1 drop into both eyes 2 (two) times daily as needed (dry eyes).    unk   pregabalin (LYRICA) 100 MG capsule Take 1 capsule (100 mg total) by mouth 3 (three) times daily. 30 capsule 0 06/13/2021   rOPINIRole (REQUIP) 2 MG tablet Take 2 mg by mouth at bedtime.   06/12/2021   sulfamethoxazole-trimethoprim (BACTRIM DS) 800-160 MG tablet Take 1 tablet by mouth 2 (two) times daily. (Patient taking differently: Take 1 tablet by mouth See admin instructions. Bid x 10 days) 20 tablet 0 06/13/2021   Syringe/Needle, Disp, (SYRINGE 3CC/22GX1-1/2") 22G X 1-1/2" 3 ML MISC 1 Syringe by Does not apply route as directed. For b12 IM inj 10 each 0    tamsulosin (FLOMAX) 0.4 MG CAPS capsule Take 0.4 mg by mouth daily.   06/13/2021   torsemide (DEMADEX) 20 MG tablet Take 20 mg by mouth in the morning and at bedtime. Take on extra for swelling   06/12/2021   nitroGLYCERIN (NITRODUR - DOSED IN MG/24 HR) 0.2 mg/hr patch Place 1 patch (0.2 mg total) onto the skin daily. 30 patch 12    Scheduled:   atorvastatin  40 mg Oral QHS   carvedilol  37.5 mg Oral BID WC   Chlorhexidine Gluconate Cloth  6 each Topical Q0600    fentaNYL  1 patch Transdermal Q72H   insulin aspart  0-9 Units Subcutaneous Q4H   insulin glargine-yfgn  50 Units Subcutaneous QHS   mupirocin ointment  1 application Nasal BID   sodium chloride flush  3 mL Intravenous Q12H    Assessment: 65 yo male on apixaban PTA for afib (last dose taken 10/3). He is s/p procedure on 105 and has L foot osteomyelitis  and plans for possible procedures. He is noted with ESRD on HD. -hg= 9.6  Goal of Therapy:  Heparin level 0.3-0.7 units/ml Monitor platelets by anticoagulation protocol: Yes   Plan:  --No heparin bolus with recent procedure -start heparin at 1450 units/hr -Heparin level in 8 hours and daily wth CBC daily  Hildred Laser, PharmD Clinical Pharmacist **Pharmacist phone directory can now be found on [email or web address] ([email or web address]).  Listed under [place].

## 2021-06-16 NOTE — Progress Notes (Signed)
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 and [name], paroxysmal A. fib on Eliquis, ESRD on hemodialysis TTS, chronic diastolic heart failure, diabetes type 2, hypertension, dyslipidemia who was sent from dialysis center to the ED for further evaluation of wound infection.  Patient is followed by [name] for chronic left foot wound infection with history of PAD, at follow-up on 10/3 he discussed potential need for transtibial amputation, also followed by [name] due to peripheral vascular disease and plan for angiogram.  Patient was noticed to have increasing swelling and pain around the penis and the scrotum, noted purulent discharge from the urethra and drainage of purulent secretion from  penile prothesis.  In the ED he was found to have leukocytosis white count 21,000, lactic acidosis. He was evaluated by [name] and underwent removal of 3 piece inflatable penile prosthesis on 06/14/2021.  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 Urine culture grew Enterococcus fecaelis.   2-Left foot infection, abnormal signal abnormalities of  cuboid worrisome for osteomyelitis, cellulitis and mild fasciitis peripheral arterial disease, right BKA -Consulted with [name] and [name]. -[name] will try to perform arteriogram soon. -Continue with IV antibiotics  3-ESRD on hemodialysis: Nephrology following Had Dialysis Tuesday  4-Paroxysmal A. fib: Continue with Coreg And Eliquis for urological procedure.  Sent message to [name] to see when we can resume anticoagulation.  We will probably do Lovenox/heparin  in anticipation of further vascular procedure  5-Diabetes type 2: Continue with Lantus Sliding scale insulin  CAD status post PCI: Continue with Coreg and atorvastatin Hypertension: Continue with carvedilol    Estimated body mass index is 32.49 kg/m as calculated from the following:   Height as of this encounter: 6\' 2"  (1.88 m).   Weight as of this encounter: 114.8 kg.   DVT prophylaxis: SCD Code Status: Full code Family Communication: care discussed with patient Disposition Plan:  Status is: Inpatient  Remains inpatient appropriate because:Inpatient level of care appropriate due to severity of illness  Dispo: The patient is from: Home              Anticipated d/c is to: SNF              Patient currently is not medically stable to d/c.   Difficult to place patient No        Consultants:  Dr [name] Nephrology Dr [name] Dr [name].   Procedures:  Removal of 3 pieces inflatable penile prothesis  that were infected  Antimicrobials:   Cefepime 10/4  Vancomycin 10/4 Fluconazole.  10/5   Subjective: He report leaking from foley catheter.  Drain in place with bloody drainage.  He relates Dr [name] wants to do vascular innervation for his leg. I will contact Dr [name].   Objective: Vitals:   06/15/21 2315 06/16/21 0411 06/16/21 0830  06/16/21 1211  BP: (!) 152/83 (!) 142/60 124/71 (!) 108/58  Pulse: 90 89 73 77  Resp: 19 14 16 18   Temp: 98.1 F (36.7 C) 98.2 F (36.8 C) 97.8 F (36.6 C) 97.8 F (36.6 C)  TempSrc: Oral Oral Oral Oral  SpO2: 98% 96% 99% 98%  Weight:      Height:        Intake/Output Summary (Last 24 hours) at 06/16/2021 1526 Last data filed at 06/16/2021 1215 Gross per 24 hour  Intake 240 ml  Output 2485 ml  Net -2245 ml   Filed Weights   06/15/21 0600 06/15/21 1358 06/15/21 1713  Weight: 112.4 kg 110 kg 114.8 kg    Examination:  General exam: Appears calm and comfortable  Respiratory system: Clear to auscultation. Respiratory effort normal. Cardiovascular system: S1 & S2 heard, RRR. No JVD, murmurs, rubs, gallops or clicks. No pedal edema. Gastrointestinal system: Abdomen is nondistended, soft and nontender. No organomegaly or masses felt. Normal bowel sounds heard. Pelvis; he has two drain in place, supra-pubic region.  Central nervous system: Alert and oriented.  Extremities: right BKA, left foot with edema, dressing in place.   Data Reviewed: I have personally reviewed following labs and imaging studies  CBC: Recent Labs  Lab 06/13/21 2017 06/14/21 0213 06/15/21 0120 06/16/21 0214  WBC 21.7* 15.2* 15.5* 16.8*  NEUTROABS 19.5*  --   --   --   HGB 12.1* 10.1* 10.1* 9.6*  HCT 36.8* 30.6* 29.9* 29.7*  MCV 91.3 93.0 92.0 92.8  PLT 358 241 219 119   Basic Metabolic Panel: Recent Labs  Lab 06/13/21 2017 06/14/21 0213 06/15/21 0120 06/16/21 0214  NA 136 133* 134* 135  K 4.1 4.1 3.8 3.4*  CL 91* 94* 96* 96*  CO2 26 27 24 24   GLUCOSE 336* 392* 280* 193*  BUN 18 24* 40* 32*  CREATININE 4.23* 4.28* 5.36* 4.19*  CALCIUM 8.7* 7.8* 8.1* 7.9*   GFR: Estimated Creatinine Clearance: 23 mL/min (A) (by C-G formula based on SCr of 4.19 mg/dL (H)). Liver Function Tests: Recent Labs  Lab 06/13/21 2017  AST 36  ALT 22  ALKPHOS 123  BILITOT 1.1  PROT 6.8  ALBUMIN 2.7*   No  results for input(s): LIPASE, AMYLASE in the last 168 hours. No results for input(s): AMMONIA in the last 168 hours. Coagulation Profile: Recent Labs  Lab 06/13/21 2017 06/14/21 0213  INR 1.5* 1.5*   Cardiac Enzymes: No results for input(s): CKTOTAL, CKMB, CKMBINDEX, TROPONINI in the last 168 hours. BNP (last 3 results) No results for input(s): PROBNP in the last 8760 hours. HbA1C: Recent Labs    06/14/21 0214  HGBA1C 7.9*   CBG: Recent Labs  Lab 06/15/21 2051 06/15/21 2317 06/16/21 0407 06/16/21 0824 06/16/21 1208  GLUCAP 257* 243* 169* 168* 118*   Lipid Profile: No results for input(s): CHOL, HDL, LDLCALC, TRIG, CHOLHDL, LDLDIRECT in the last 72 hours. Thyroid Function Tests: No results for input(s): TSH, T4TOTAL, FREET4, T3FREE, THYROIDAB in the last 72 hours. Anemia Panel: No results for input(s): VITAMINB12, FOLATE, FERRITIN, TIBC, IRON, RETICCTPCT in the last 72 hours. Sepsis Labs: Recent Labs  Lab 06/13/21 1940 06/13/21 2200 06/14/21 0213 06/14/21 0217  PROCALCITON  --   --  3.07  --   LATICACIDVEN 3.5* 3.9*  --  2.4*    Recent Results (from the past 240 hour(s))  Blood Culture (routine x 2)     Status: None (Preliminary result)   Collection Time: 06/13/21  7:40 PM   Specimen: BLOOD  Result Value Ref Range Status   Specimen Description BLOOD RIGHT ANTECUBITAL  Final   Special Requests   Final    BOTTLES DRAWN AEROBIC AND ANAEROBIC Blood Culture results may not be optimal due to an inadequate volume of blood received in culture bottles   Culture   Final    NO GROWTH 3 DAYS Performed at [facility] [address], [place], [place] [address]    Report Status PENDING  Incomplete  Resp Panel by RT-PCR (Flu A&B, Covid) Nasopharyngeal Swab     Status: None   Collection Time: 06/13/21  8:17 PM   Specimen: Nasopharyngeal Swab; Nasopharyngeal(NP) swabs in vial transport medium  Result Value Ref Range Status   SARS Coronavirus 2 by RT PCR NEGATIVE  NEGATIVE Final    Comment: (NOTE) SARS-CoV-2 target nucleic acids are NOT DETECTED.  The SARS-CoV-2 RNA is generally detectable in upper respiratory specimens during the acute phase of infection. The lowest concentration of SARS-CoV-2 viral copies this assay can detect is 138 copies/mL. A negative result does not preclude SARS-Cov-2 infection and should not be used as the sole basis for treatment or other patient management decisions. A negative result may occur with  improper specimen collection/handling, submission of specimen other than nasopharyngeal swab, presence of viral mutation(s) within the areas targeted by this assay, and inadequate number of viral copies(<138 copies/mL). A negative result must be combined with clinical observations, patient history, and epidemiological information. The expected result is Negative.  Fact Sheet for Patients:  EntrepreneurPulse.com.au  Fact Sheet for Healthcare Providers:  IncredibleEmployment.be  This test is no t yet approved or cleared by the [place] FDA and  has been authorized for detection and/or diagnosis of SARS-CoV-2 by FDA under an Emergency Use Authorization (EUA). This EUA will remain  in effect (meaning this test can be used) for the duration of the COVID-19 declaration under Section 564(b)(1) of the Act, 21 U.S.C.section 360bbb-3(b)(1), unless the authorization is terminated  or revoked sooner.       Influenza A by PCR NEGATIVE NEGATIVE Final   Influenza B by PCR NEGATIVE NEGATIVE Final    Comment: (NOTE) The Xpert Xpress SARS-CoV-2/FLU/RSV plus assay is intended as an aid in the diagnosis of influenza from Nasopharyngeal swab specimens and should not be used as a sole basis for treatment. Nasal washings and aspirates are unacceptable for Xpert Xpress SARS-CoV-2/FLU/RSV testing.  Fact Sheet for Patients: EntrepreneurPulse.com.au  Fact Sheet for Healthcare  Providers: IncredibleEmployment.be  This test is not yet approved or cleared by the [place] FDA and has been authorized for detection and/or diagnosis of SARS-CoV-2 by FDA under an Emergency Use Authorization (EUA). This EUA will remain in effect (meaning this test can be used) for the duration of the COVID-19 declaration under Section 564(b)(1) of the Act, 21 U.S.C. section 360bbb-3(b)(1), unless the authorization is terminated or revoked.  Performed at [facility], [place] [address], [place], [place] [address]   Blood Culture (routine x 2)     Status: None (Preliminary result)   Collection Time: 06/13/21  8:50 PM   Specimen: BLOOD RIGHT HAND  Result Value Ref Range Status   Specimen Description BLOOD RIGHT HAND  Final  Special Requests   Final    BOTTLES DRAWN AEROBIC AND ANAEROBIC Blood Culture adequate volume   Culture   Final    NO GROWTH 3 DAYS Performed at [facility], [place] [address], [place], [place] [address]    Report Status PENDING  Incomplete  Urine Culture     Status: Abnormal (Preliminary result)   Collection Time: 06/14/21 12:20 PM   Specimen: In/Out Cath Urine  Result Value Ref Range Status   Specimen Description IN/OUT CATH URINE  Final   Special Requests NONE  Final   Culture (A)  Final    >=100,000 COLONIES/mL ENTEROCOCCUS FAECALIS SUSCEPTIBILITIES TO FOLLOW Performed at [facility], [place] [address] [place], [place] [address]    Report Status PENDING  Incomplete  Aerobic/Anaerobic Culture w Gram Stain (surgical/deep wound)     Status: None (Preliminary result)   Collection Time: 06/14/21  8:14 PM   Specimen: PATH GI Other; Tissue  Result Value Ref Range Status   Specimen Description WOUND PENIS  Final   Special Requests DISCHARGE  Final   Gram Stain   Final    NO ORGANISMS SEEN SQUAMOUS EPITHELIAL CELLS PRESENT ABUNDANT WBC PRESENT,BOTH PMN AND MONONUCLEAR MODERATE GRAM POSITIVE COCCI    Culture   Final     FEW ENTEROCOCCUS FAECALIS FEW GROUP B STREP(S.AGALACTIAE)ISOLATED TESTING AGAINST S. AGALACTIAE NOT ROUTINELY PERFORMED DUE TO PREDICTABILITY OF AMP/PEN/VAN SUSCEPTIBILITY. CULTURE REINCUBATED FOR BETTER GROWTH Performed at [facility], [place] [address] [place], [place] [address]    Report Status PENDING  Incomplete  Surgical PCR screen     Status: Abnormal   Collection Time: 06/14/21 11:20 PM   Specimen: Nasal Mucosa; Nasal Swab  Result Value Ref Range Status   MRSA, PCR POSITIVE (A) NEGATIVE Final    Comment: RESULT CALLED TO, READ BACK BY AND VERIFIED WITH: RN CAREY D. 06/15/21@1 :09 BY TW    Staphylococcus aureus POSITIVE (A) NEGATIVE Final    Comment: (NOTE) The Xpert SA Assay (FDA approved for NASAL specimens in patients 87 years of age and older), is one component of a comprehensive surveillance program. It is not intended to diagnose infection nor to guide or monitor treatment. Performed at [facility], [place] [address], [place], [place] [address]          Radiology Studies: MR FOOT LEFT WO CONTRAST  Result Date: 06/16/2021 CLINICAL DATA:  Chronic diabetic foot wound. EXAM: MRI OF THE LEFT FOOT WITHOUT CONTRAST TECHNIQUE: Multiplanar, multisequence MR imaging of the left foot was performed. No intravenous contrast was administered. COMPARISON:  Radiographs 06/13/2021 FINDINGS: There is a lateral and plantar foot wound extending right down close to the cuboid and base of the fifth metatarsal. Small locules of gas are suspected. I do not see a discrete fluid collection to suggest a drainable abscess but there are changes of cellulitis involving the foot and ankle. Myofasciitis mainly involving the short flexor muscles of the foot without definite findings for pyomyositis. Abnormal T2 signal intensity in the cuboid is worrisome for osteomyelitis. I do not see any other sites suspicious for osteomyelitis or septic arthritis. Moderate degenerative changes throughout the  foot. Markedly thickened peroneal tendons possibly suggesting chronic tendinopathy. Hindfoot varus noted. IMPRESSION: 1. Lateral and plantar foot wound extending down close to the cuboid and base of the fifth metatarsal. Small locules of gas are suspected. 2. Abnormal T2 signal intensity in the cuboid worrisome for osteomyelitis. 3. Cellulitis and myofasciitis without focal drainable soft  tissue abscess or pyomyositis. Electronically Signed   By: Marijo Sanes M.D.   On: 06/16/2021 08:22        Scheduled Meds:  atorvastatin  40 mg Oral QHS   carvedilol  37.5 mg Oral BID WC   Chlorhexidine Gluconate Cloth  6 each Topical Q0600   fentaNYL  1 patch Transdermal Q72H   insulin aspart  0-9 Units Subcutaneous Q4H   insulin glargine-yfgn  50 Units Subcutaneous QHS   mupirocin ointment  1 application Nasal BID   sodium chloride flush  3 mL Intravenous Q12H   Continuous Infusions:  ceFEPime (MAXIPIME) IV 2 g (06/15/21 1827)   fluconazole (DIFLUCAN) IV 200 mg (06/15/21 2109)   metronidazole 500 mg (06/16/21 0904)   vancomycin Stopped (06/15/21 1711)     LOS: 2 days    Time spent: 35 minutes.     Elmarie Shiley, MD [facility]   If 7PM-7AM, please contact night-coverage [email or web address]  06/16/2021, 3:26 PM

## 2021-06-16 NOTE — H&P (View-Only) (Signed)
CARDIOLOGY CONSULT NOTE  Patient ID: [name] MRN: [id number] DOB/AGE: 1956-07-14 65 y.o.  Admit date: 06/13/2021 Referring Physician  Carney Harder Primary Physician:  [name], PA-C Reason for Consultation  PAD, A. Fib  Patient ID: [name], male    DOB: September 25, 1955, 65 y.o.   MRN: [id number]  Chief Complaint  Patient presents with   Wound Infection   HPI:    [name]  is a 65 y.o. Caucasian male patient with severe peripheral arterial disease and has history of right below-knee amputation and toe lamputations on the left, multiple peripheral interventions in the past, uncontrolled diabetes mellitus, ESRD, severe diabetic retinopathy, coronary artery disease angioplasty to the right coronary artery on 10/29/2014, right carotid endarterectomy in 2012 , OSA Unable to tolerate CPAP, chronic back pain and neck and back surgery in past and on chronic pain medications, persistent atrial fibrillation diagnosed about a month ago and presently on Eliquis, chronic left foot ulceration, history of penile prosthesis for erectile dysfunction admitted with fever and sepsis, found to have infected penile prosthesis and also gas gangrene involving his left foot.  I was consulted for management of PAD.  States that since being the hospital he is feeling much better, the discharge from his groin site has improved significantly, he has not had any chills or fever.  No pain in his left leg ulceration site.  Past Medical History:  Diagnosis Date   Carotid artery occlusion 11/10/10   LEFT CAROTID ENDARTERECTOMY   Chronic kidney disease    Complication of anesthesia    BP WENT UP AT [facility] "   COPD (chronic obstructive pulmonary disease) ([place])    pt denies this dx as of 06/01/20 - no inhaler    Diabetes mellitus without complication ([place])    Diverticulitis    Diverticulosis of colon (without mention of hemorrhage)    DJD (degenerative joint disease)    knees/hands/feet/back/neck   Fatty  liver    Full dentures    GERD (gastroesophageal reflux disease)    H/O hiatal hernia    History of blood transfusion    with a past surical procedure per patient 06/01/20   Hyperlipidemia    Hypertension    Neuromuscular disorder ([place])    peripheral neuropathy   Non-pressure chronic ulcer of other part of left foot limited to breakdown of skin ([place]) 11/12/2016   Osteomyelitis ([facility])    left 5th metatarsal   PAD (peripheral artery disease) ([place])    Distal aortogram June 2012. Atherectomy left popliteal artery July 2012.    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   Unstable angina ([place]) 09/16/2018   Wears glasses    Past Surgical History:  Procedure Laterality Date   AMPUTATION  11/05/2011   Procedure: AMPUTATION RAY;  Surgeon: Wylene Simmer, MD;  Location: [place];  Service: Orthopedics;  Laterality: Right;  Amputation of Right 4&5th Toes   AMPUTATION Left 11/26/2012   Procedure: AMPUTATION RAY;  Surgeon: Wylene Simmer, MD;  Location: [place];  Service: Orthopedics;  Laterality: Left;  fourth ray amputation   AMPUTATION Right 08/27/2014   Procedure: Transmetatarsal Amputation;  Surgeon: Newt Minion, MD;  Location: [place];  Service: Orthopedics;  Laterality: Right;   AMPUTATION Right 01/14/2015   Procedure: AMPUTATION BELOW KNEE;  Surgeon: Newt Minion, MD;  Location: [place];  Service: Orthopedics;  Laterality: Right;   AMPUTATION Left 10/21/2015   Procedure: Left Foot 5th Ray Amputation;  Surgeon: Newt Minion, MD;  Location: [place];  Service: Orthopedics;  Laterality: Left;   ANTERIOR FUSION CERVICAL SPINE  02/06/06   C4-5, C5-6, C6-7; SURGEON DR. MAX COHEN   AV FISTULA PLACEMENT Left 06/02/2020   Procedure: ARTERIOVENOUS (AV) FISTULA CREATION LEFT;  Surgeon: Waynetta Sandy, MD;  Location: [place];  Service: Vascular;  Laterality: Left;   BACK SURGERY     x 3    [place] Left 07/21/2020   Procedure: LEFT UPPER ARM ATERIOVENOUS SUPERFISTULALIZATION;  Surgeon: Waynetta Sandy, MD;  Location: [place];  Service: Vascular;  Laterality: Left;   BELOW KNEE LEG AMPUTATION Right    CARDIAC CATHETERIZATION  10/31/04   2009   CAROTID ENDARTERECTOMY  11/10/10   CAROTID ENDARTERECTOMY Left 11/10/2010   Subtotal occlusion of left internal carotid artery with left hemispheric transient ischemic attacks.   CAROTID STENT     CARPAL TUNNEL RELEASE Right 10/21/2013   Procedure: RIGHT CARPAL TUNNEL RELEASE;  Surgeon: Wynonia Sours, MD;  Location: [place];  Service: Orthopedics;  Laterality: Right;   CHOLECYSTECTOMY     COLON SURGERY     COLONOSCOPY     COLOSTOMY REVERSAL  05/21/2018   ileostomy reversal   CYSTOSCOPY WITH STENT PLACEMENT Bilateral 01/13/2018   Procedure: CYSTOSCOPY WITH BILATERAL URETERAL CATHETER PLACEMENT;  Surgeon: Ardis Hughs, MD;  Location[facility];  Service: Urology;  Laterality: Bilateral;   ESOPHAGEAL MANOMETRY Bilateral 07/19/2014   Procedure: ESOPHAGEAL MANOMETRY (EM);  Surgeon: Jerene Bears, MD;  [facility];  Service: Gastroenterology;  Laterality: Bilateral;   EYE SURGERY Bilateral 2020   cataract   FEMORAL ARTERY STENT     x6   FINGER SURGERY     FOOT SURGERY  04/25/2016    EXCISION BASE 5TH METATARSAL AND PARTIAL CUBOID LEFT FOOT   HERNIA REPAIR     LEFT INGUINAL AND UMBILICAL REPAIRS   HERNIA REPAIR     I & D EXTREMITY Left 04/25/2016   Procedure: EXCISION BASE 5TH METATARSAL AND PARTIAL CUBOID LEFT FOOT;  Surgeon: Newt Minion, MD;  Location: [place];  Service: Orthopedics;  Laterality: Left;   ILEOSTOMY  01/13/2018   Procedure: ILEOSTOMY;  Surgeon: Clovis Riley, MD;  Location[facility];  Service: General;;   ILEOSTOMY CLOSURE N/A 05/21/2018   Procedure: ILEOSTOMY REVERSAL ERAS PATHWAY;  Surgeon: Clovis Riley, MD;  Location: [place];  Service: General;  Laterality: N/A;    IR RADIOLOGIST EVAL & MGMT  11/19/2017   IR RADIOLOGIST EVAL & MGMT  12/03/2017   IR RADIOLOGIST EVAL & MGMT  12/18/2017   JOINT REPLACEMENT Right 2001   Total knee   LAMINECTOMY     X 3 LUMBAR AND X 2 CERVICAL SPINE OPERATIONS   LAPAROSCOPIC CHOLECYSTECTOMY W/ CHOLANGIOGRAPHY  11/09/04   SURGEON DR. [place] CATH AND CORONARY ANGIOGRAPHY N/A 09/16/2018   Procedure: LEFT HEART CATH AND CORONARY ANGIOGRAPHY;  Surgeon: Nigel Mormon, MD;  Location: [facility] [facility];  Service: Cardiovascular;  Laterality: N/A;   LEFT HEART CATHETERIZATION WITH CORONARY ANGIOGRAM N/A 10/29/2014   Procedure: LEFT HEART CATHETERIZATION WITH CORONARY ANGIOGRAM;  Surgeon: Laverda Page, MD;  Location: [facility];  Service: Cardiovascular;  Laterality: N/A;   LIGATION OF COMPETING BRANCHES OF ARTERIOVENOUS FISTULA Left 07/21/2020  Procedure: LIGATION OF COMPETING BRANCHES OF LEFT UPPER ARM ARTERIOVENOUS FISTULA;  Surgeon: Waynetta Sandy, MD;  Location: [place];  Service: Vascular;  Laterality: Left;   LOWER EXTREMITY ANGIOGRAM N/A 03/19/2012   Procedure: LOWER EXTREMITY ANGIOGRAM;  Surgeon: Burnell Blanks, MD;  Location: [facility];  Service: Cardiovascular;  Laterality: N/A;   NECK SURGERY     PARTIAL COLECTOMY N/A 01/13/2018   Procedure: LAPAROSCOPIC ASSISTED   SIGMOID COLECTOMY ILEOSTOMY;  Surgeon: Clovis Riley, MD;  Location[facility];  Service: General;  Laterality: N/A;   PENILE PROSTHESIS IMPLANT  08/14/05   INFRAPUBIC INSERTION OF INFLATABLE PENILE PROSTHESIS; SURGEON DR. Amalia Hailey   PENILE PROSTHESIS IMPLANT     PERCUTANEOUS CORONARY STENT INTERVENTION (PCI-S) Right 10/29/2014   Procedure: PERCUTANEOUS CORONARY STENT INTERVENTION (PCI-S);  Surgeon: Laverda Page, MD;  Location: [facility] C[facility] LAB;  Service: Cardiovascular;  Laterality: Right;   REMOVAL OF PENILE PROSTHESIS N/A 06/14/2021   Procedure: Removal of THREE piece inflatable penile prosthesis;  Surgeon: Lucas Mallow, MD;  Location: [place];  Service: Urology;  Laterality: N/A;   SHOULDER ARTHROSCOPY     SPINE SURGERY     TOE AMPUTATION Left    TONSILLECTOMY     TOTAL KNEE ARTHROPLASTY  07/2002   RIGHT KNEE ; SURGEON  DR. [name] ALSO HAD ARTHROSCOPIC RIGHT KNEE IN  10/2001   TOTAL KNEE ARTHROPLASTY     ULNAR NERVE TRANSPOSITION Right 10/21/2013   Procedure: RIGHT ELBOW  ULNAR NERVE DECOMPRESSION;  Surgeon: Wynonia Sours, MD;  Location: [place];  Service: Orthopedics;  Laterality: Right;   Social History   Tobacco Use   Smoking status: Former    Packs/day: 2.00    Years: 35.00    Pack years: 70.00    Types: Cigarettes    Quit date: 10/28/2011    Years since quitting: 9.6   Smokeless tobacco: Never  Substance Use Topics   Alcohol use: Not Currently    Comment: "not in a long time"    Family History  Problem Relation Age of Onset   Heart disease Father        Before age 65-  CAD, BPG   Diabetes Father        Amputation   Cancer Father        PROSTATE   Hyperlipidemia Father    Hypertension Father    Heart attack Father        Triple BPG   Varicose Veins Father    Colon cancer Brother    Diabetes Brother    Heart disease Brother 48       A-Fib. Before age 43   Hyperlipidemia Brother    Hypertension Brother    Cancer Sister        Breast   Hyperlipidemia Sister    Hypertension Sister    Hypertension Son    Arthritis Other        GRANDMOTHER   Hypertension Other        OTHER FAMILY MEMBERS    Marital Status: Married  ROS  Review of Systems  Constitutional: Positive for malaise/fatigue and night sweats.  HENT: Negative.    Cardiovascular:  Positive for chest pain and dyspnea on exertion. Negative for claudication.  Respiratory:  Positive for snoring. Negative for wheezing.   Musculoskeletal:  Positive for back pain.  Gastrointestinal: Negative.   Psychiatric/Behavioral: Negative.    All other systems reviewed and are negative. Objective   Vitals with  BMI 06/16/2021 06/16/2021 06/16/2021  Height - - -  Weight - - -  BMI - - -  Systolic [phone]  Diastolic 69 58 71  Pulse 71 77 73    Blood pressure 140/69, pulse 71, temperature 98.3 F (36.8 C), temperature source Oral, resp. rate 18, height 6\' 2"  (1.88 m), weight 114.8 kg, SpO2 97 %.    Physical Exam Constitutional:      General: He is not in acute distress.    Appearance: He is obese.  HENT:     Head: Normocephalic.     Mouth/Throat:     Mouth: Mucous membranes are moist.  Neck:     Vascular: Carotid bruit (Bilateral) present.  Cardiovascular:     Rate and Rhythm: Normal rate. Rhythm irregular.     Pulses:          Dorsalis pedis pulses are 0 on the right side.       Posterior tibial pulses are 0 on the right side.     Heart sounds: No murmur heard.    Comments: Left foot ulceration noted covered in tape.  Swollen foot.  Chronic ischemic changes in the entire left leg below the knee.  Right below-knee amputation.  Stump is healthy. Abdominal:     General: Abdomen is flat. Bowel sounds are normal.     Palpations: Abdomen is soft.  Genitourinary:    Comments: Drainage tube from the pubic area noted.  Postsurgical Staples  in the suprapubic region noted. Musculoskeletal:        General: Deformity (Left foot with ulceration and partial to amputations from previous surgeries.  No foul-smelling discharge.) present.     Cervical back: Neck supple.  Neurological:     General: No focal deficit present.     Mental Status: He is alert and oriented to person, place, and time.   Laboratory examination:   Recent Labs    06/14/21 0213 06/15/21 0120 06/16/21 0214  NA 133* 134* 135  K 4.1 3.8 3.4*  CL 94* 96* 96*  CO2 27 24 24   GLUCOSE 392* 280* 193*  BUN 24* 40* 32*  CREATININE 4.28* 5.36* 4.19*  CALCIUM 7.8* 8.1* 7.9*  GFRNONAA 14* 11* 15*   estimated creatinine clearance is 23 mL/min (A) (by C-G formula based on SCr of 4.19 mg/dL (H)).  CMP Latest Ref Rng & Units  06/16/2021 06/15/2021 06/14/2021  Glucose 70 - 99 mg/dL 193(H) 280(H) 392(H)  BUN 8 - 23 mg/dL 32(H) 40(H) 24(H)  Creatinine 0.61 - 1.24 mg/dL 4.19(H) 5.36(H) 4.28(H)  Sodium 135 - 145 mmol/L 135 134(L) 133(L)  Potassium 3.5 - 5.1 mmol/L 3.4(L) 3.8 4.1  Chloride 98 - 111 mmol/L 96(L) 96(L) 94(L)  CO2 22 - 32 mmol/L 24 24 27   Calcium 8.9 - 10.3 mg/dL 7.9(L) 8.1(L) 7.8(L)  Total Protein 6.5 - 8.1 g/dL - - -  Total Bilirubin 0.3 - 1.2 mg/dL - - -  Alkaline Phos 38 - 126 U/L - - -  AST 15 - 41 U/L - - -  ALT 0 - 44 U/L - - -   CBC Latest Ref Rng & Units 06/16/2021 06/15/2021 06/14/2021  WBC 4.0 - 10.5 K/uL 16.8(H) 15.5(H) 15.2(H)  Hemoglobin 13.0 - 17.0 g/dL 9.6(L) 10.1(L) 10.1(L)  Hematocrit 39.0 - 52.0 % 29.7(L) 29.9(L) 30.6(L)  Platelets 150 - 400 K/uL 269 219 241   Lipid Panel No results for input(s): CHOL, TRIG, LDLCALC, VLDL, HDL, CHOLHDL, LDLDIRECT in the last 8760 hours.  HEMOGLOBIN A1C Lab Results  Component Value Date   HGBA1C 7.9 (H) 06/14/2021   MPG 180.03 06/14/2021   TSH No results for input(s): TSH in the last 8760 hours. BNP (last 3 results)  Medications and allergies   Allergies  Allergen Reactions   Contrast Media [Iodinated Diagnostic Agents]     Difficulty breathing     Ivp Dye [Iodinated Diagnostic Agents] Anaphylaxis and Other (See Comments)    Breathing problems, altered mental state    Adhesive [Tape] Rash    Rash after 3-4 days use     Current Meds  Medication Sig   amLODipine (NORVASC) 5 MG tablet Take 5 mg by mouth daily.   apixaban (ELIQUIS) 5 MG TABS tablet Take 5 mg by mouth See admin instructions. Only take on non-dialysis days Monday,Wednesday,Friday,sunday   atorvastatin (LIPITOR) 40 MG tablet Take 40 mg by mouth at bedtime.    BIDIL 20-37.5 MG tablet Take 1 tablet by mouth See admin instructions. Take on Monday,Wednesday,Friday,sunday   carvedilol (COREG) 25 MG tablet Take 1.5 tablets (37.5 mg total) by mouth 2 (two) times daily. (Patient  taking differently: Take 12.5 mg by mouth 2 (two) times daily.)   famotidine (PEPCID) 40 MG tablet Take 40 mg by mouth daily.   fentaNYL (DURAGESIC - DOSED MCG/HR) 50 MCG/HR Place 50 mcg onto the skin every 3 (three) days.   folic acid-vitamin b complex-vitamin c-selenium-zinc (DIALYVITE) 3 MG TABS tablet Take 1 tablet by mouth daily.   Glucosamine HCl (GLUCOSAMINE PO) Take 100 tablets by mouth 2 (two) times daily.   HUMALOG KWIKPEN 100 UNIT/ML KiwkPen Inject 13 Units into the skin 3 (three) times daily.   insulin glargine (LANTUS) 100 UNIT/ML injection Inject 55 Units into the skin at bedtime.   ketoconazole (NIZORAL) 2 % cream Apply 1 application topically 2 (two) times daily as needed for irritation.    lidocaine-prilocaine (EMLA) cream Dialysis days Tuesday,Thursday and saturday   linaclotide (LINZESS) 145 MCG CAPS capsule Take 145 mcg by mouth daily as needed (constipation).    loratadine (CLARITIN) 10 MG tablet Take 10 mg by mouth daily.   NEEDLE, REUSABLE, 22 G 22G X 1-1/2" MISC 1 Units by Does not apply route as directed. For B12 IM inj   omega-3 acid ethyl esters (LOVAZA) 1 g capsule Take 2 g by mouth 2 (two) times daily.   ondansetron (ZOFRAN) 4 MG tablet Take 4 mg by mouth every 6 (six) hours as needed for nausea or vomiting.   oxyCODONE-acetaminophen (PERCOCET) 7.5-325 MG tablet Take 1 tablet by mouth 3 (three) times daily as needed for moderate pain.   pantoprazole (PROTONIX) 40 MG tablet Take 40 mg by mouth daily.   Polyvinyl Alcohol-Povidone PF 1.4-0.6 % SOLN Place 1 drop into both eyes 2 (two) times daily as needed (dry eyes).    pregabalin (LYRICA) 100 MG capsule Take 1 capsule (100 mg total) by mouth 3 (three) times daily.   rOPINIRole (REQUIP) 2 MG tablet Take 2 mg by mouth at bedtime.   sulfamethoxazole-trimethoprim (BACTRIM DS) 800-160 MG tablet Take 1 tablet by mouth 2 (two) times daily. (Patient taking differently: Take 1 tablet by mouth See admin instructions. Bid x 10  days)   Syringe/Needle, Disp, (SYRINGE 3CC/22GX1-1/2") 22G X 1-1/2" 3 ML MISC 1 Syringe by Does not apply route as directed. For b12 IM inj   tamsulosin (FLOMAX) 0.4 MG CAPS capsule Take 0.4 mg by mouth daily.   torsemide (DEMADEX) 20 MG tablet Take 20 mg by mouth  in the morning and at bedtime. Take on extra for swelling    Scheduled Meds:  atorvastatin  40 mg Oral QHS   carvedilol  37.5 mg Oral BID WC   Chlorhexidine Gluconate Cloth  6 each Topical Q0600   fentaNYL  1 patch Transdermal Q72H   insulin aspart  0-9 Units Subcutaneous Q4H   insulin glargine-yfgn  50 Units Subcutaneous QHS   mupirocin ointment  1 application Nasal BID   sodium chloride flush  3 mL Intravenous Q12H   Continuous Infusions:  ceFEPime (MAXIPIME) IV 2 g (06/15/21 1827)   fluconazole (DIFLUCAN) IV 200 mg (06/15/21 2109)   heparin     metronidazole 500 mg (06/16/21 0904)   vancomycin Stopped (06/15/21 1711)   PRN Meds:.acetaminophen **OR** acetaminophen, HYDROmorphone (DILAUDID) injection, ondansetron **OR** ondansetron (ZOFRAN) IV   I/O last 3 completed shifts: In: 1120 [P.O.:370; I.V.:500; IV Piggyback:250] Out: 2395 [Urine:200; Drains:55; MEQAS:3419; Blood:25] Total I/O In: 76 [P.O.:480] Out: 200 [Urine:200]   Radiology:   MR FOOT LEFT WO CONTRAST  Result Date: 06/16/2021 CLINICAL DATA:  Chronic diabetic foot wound. EXAM: MRI OF THE LEFT FOOT WITHOUT CONTRAST TECHNIQUE: Multiplanar, multisequence MR imaging of the left foot was performed. No intravenous contrast was administered. COMPARISON:  Radiographs 06/13/2021 FINDINGS: There is a lateral and plantar foot wound extending right down close to the cuboid and base of the fifth metatarsal. Small locules of gas are suspected. I do not see a discrete fluid collection to suggest a drainable abscess but there are changes of cellulitis involving the foot and ankle. Myofasciitis mainly involving the short flexor muscles of the foot without definite findings  for pyomyositis. Abnormal T2 signal intensity in the cuboid is worrisome for osteomyelitis. I do not see any other sites suspicious for osteomyelitis or septic arthritis. Moderate degenerative changes throughout the foot. Markedly thickened peroneal tendons possibly suggesting chronic tendinopathy. Hindfoot varus noted. IMPRESSION: 1. Lateral and plantar foot wound extending down close to the cuboid and base of the fifth metatarsal. Small locules of gas are suspected. 2. Abnormal T2 signal intensity in the cuboid worrisome for osteomyelitis. 3. Cellulitis and myofasciitis without focal drainable soft tissue abscess or pyomyositis. Electronically Signed   By: Marijo Sanes M.D.   On: 06/16/2021 08:22    Cardiac Studies:   Left popliteal artery atherectomy in June 2012 and left below knee PTA 2014 by Dr. Brunetta Jeans at [facility].    Lexiscan sestamibi stress test 03/18/2017: 1. The resting electrocardiogram demonstrated normal sinus rhythm, RBBB and no resting arrhythmias. Stress EKG is non-diagnostic for ischemia as it a pharmacologic stress using Lexiscan. Occasional PVC noted. Stress symptoms included dyspnea, dizziness and chest pain. 2. The LV is dilated both at rest and stress images. The LV end diastolic volume was 622WL. Perfusion images reveal inferior soft tissue attenuation artifact. There is no ischemia or scar. Overall left ventricular systolic function was normal without regional wall motion abnormalities. The left ventricular ejection fraction was calculated to be 51%. This is a low risk study. Compared to the study done on 10/15/2014, inferior wall ischemia and associated inferior hypokinesis is no longer present.   Coronary angiogram 09/17/18: Widely patent stent stents. Normal LVEF. Coronary Angiogram 10/29/2014: CAD s/p stenting of the distal, mid and proximal RCA with implantation of 3 overlapping drug-eluting stent, from distal to proximal 2.5 x 38 mm, 2.75 x 38 mm and a 2.75 x 20 mm Promus premier  DES.    Echocardiogram 09/16/2018:   Left ventricle: The cavity  size was normal. There was moderate concentric hypertrophy. Systolic function was normal. The estimated ejection fraction was in the range of 55% to 60%. Features are consistent with a pseudonormal left ventricular filling pattern, with concomitant abnormal relaxation and increased filling pressure (grade 2 diastolic dysfunction). Doppler parameters are consistent with both elevated ventricular end-diastolic filling pressure and elevated left atrial filling pressure. Mitral valve: Calcified annulus.  Left atrium: The atrium was mildly dilated. Inferior vena cava: The vessel was dilated. The respirophasic diameter changes was normal, may suggest elevated central venous pressure.   Carotid artery duplex  06/09/2019:  Minimal stenosis in bilateral ICA of 1-15%. Heterogeneous plaque. Stenosis  in the right external carotid artery (<50%).  Left carotid endarterectomy site is patent.  Right vertebral artery flow is not well visualized. Antegrade left  vertebral artery flow.  No significant change from 03/01/2017. Follow up studies when clinically  Indicated.   Lower extremity arterial duplex 08/19/2020: Right:  BKA.  Left: Monophasic waveforms throughout the left SFA and in small vessels.  The left toe-brachial index is abnormal.  No obvious stenosis identified from the common femoral artery to the  popliteal artery. TBI 0.58   EKG:  EKG 06/13/2021: Atrial fibrillation with rapid ventricular response at rate of 126 bpm, left axis deviation, left anterior fascicular block.  Right bundle branch block.  Poor R wave progression, cannot exclude anterolateral infarct old.  Nonspecific T abnormality.  Assessment   [name] is a 65 y.o. Caucasian male patient with severe peripheral arterial disease and has history of right below-knee amputation and toe lamputations on the left, multiple peripheral interventions in the past, uncontrolled  diabetes mellitus, ESRD, severe diabetic retinopathy, coronary artery disease angioplasty to the right coronary artery on 10/29/2014, right carotid endarterectomy in 2012 , OSA Unable to tolerate CPAP, chronic back pain and neck and back surgery in past and on chronic pain medications, persistent atrial fibrillation diagnosed about a month ago and presently on Eliquis, chronic left foot ulceration, history of penile prosthesis for erectile dysfunction admitted with fever and sepsis, found to have infected penile prosthesis and also gas gangrene involving his left foot.  I was consulted for management of PAD.  1.  Peripheral arterial disease 2.  Diabetes mellitus with diabetic foot ulcer left leg involving the bone and with gas gangrene. 3.  Coronary artery disease of the native vessel without angina pectoris 4.  End-stage renal disease on hemodialysis 5.  Persistent atrial fibrillation CHA2DS2-VASc Score is 4.  Yearly risk of stroke: 4.8% (A, HTN, DM, Vasc Dz).  Score of 1=0.6; 2=2.2; 3=3.2; 4=4.8; 5=7.2; 6=9.8; 7=>9.8) -(CHF; HTN; vasc disease DM,  Male = 1; Age <65 =0; 65-74 = 1,  >75 =2; stroke/embolism= 2).  6.  Tobacco use disorder and nicotine dependence severe, patient uses vapes.  Recommendations:   It is unfortunate that he has progressed significantly with regard to PAD.  This is a competent of both PAD and diabetic foot ulcer.  I am concerned for wound healing/amputation stump healing as he had monophasic waveforms throughout the lower extremity suggestive of SFA disease as well.  As he is presently septic, once he becomes afebrile, we will plan on peripheral arteriogram on Tuesday there is 06/20/2021 with hopes of revascularizing his lower extremities prior to amputation.  Eliquis is presently on hold, when appropriate by surgery would recommend either starting heparin full dose or starting back on Eliquis, prefer heparin as he will need peripheral arteriogram soon.  With regard to  coronary artery disease, he has remained stable without recurrence of angina or any heart failure signs.  Continue present medications, no specific recommendation from cardiology for now, atrial fibrillation is rate controlled on present medical regimen.  Unfortunate that he still continues to use nicotine, diabetes continues to be uncontrolled.  I will obtain ABI.  In view of new onset A. fib, I will also obtain an echocardiogram.   Adrian Prows, MD, [facility] 06/16/2021, 5:57 PM Office: [phone]

## 2021-06-16 NOTE — Progress Notes (Signed)
Pharmacy Antibiotic Note  [name] is a 65 y.o. male admitted on 06/13/2021 with sepsis 2/2 infected penile prosthesis. Pt also noted to have possible osteomyelitis of LLE. Pharmacy has been consulted for vancomycin and cefepime. Pt has ESRD on HD TTS. Cultures pending, prosthesis removed 10/5.   Plan: Vancomycin 1000mg  IV qHD TTS Cefepime 2g IV qHD TTS Fluconazole 200mg  IV q24h Metronidazole 500mg  IV q12h   Height: 6\' 2"  (188 cm) Weight: 114.8 kg (253 lb 1.4 oz) IBW/kg (Calculated) : 82.2  Temp (24hrs), Avg:98.1 F (36.7 C), Min:97.5 F (36.4 C), Max:98.7 F (37.1 C)  Recent Labs  Lab 06/13/21 1940 06/13/21 2017 06/13/21 2200 06/14/21 0213 06/14/21 0217 06/15/21 0120 06/16/21 0214  WBC  --  21.7*  --  15.2*  --  15.5* 16.8*  CREATININE  --  4.23*  --  4.28*  --  5.36* 4.19*  LATICACIDVEN 3.5*  --  3.9*  --  2.4*  --   --      Estimated Creatinine Clearance: 23 mL/min (A) (by C-G formula based on SCr of 4.19 mg/dL (H)).    Allergies  Allergen Reactions   Contrast Media [Iodinated Diagnostic Agents]     Difficulty breathing     Ivp Dye [Iodinated Diagnostic Agents] Anaphylaxis and Other (See Comments)    Breathing problems, altered mental state    Adhesive [Tape] Rash    Rash after 3-4 days use    Antimicrobials this admission: Vancomycin 10/4>> Cefepime 10/4>> Metronidazole 10/4>> Fluconazole 10/5 >>  Microbiology results: 10/4 BCx: in process   Thank you for allowing pharmacy to be a part of this patient's care.  Arrie Senate, PharmD, BCPS, [facility] Clinical Pharmacist [phone] Please check AMION for all [facility] numbers 06/16/2021

## 2021-06-16 NOTE — Progress Notes (Signed)
[facility] Progress Note  Subjective: seen in room, no c/o  Vitals:   06/15/21 2315 06/16/21 0411 06/16/21 0830 06/16/21 1211  BP: (!) 152/83 (!) 142/60 124/71 (!) 108/58  Pulse: 90 89 73 77  Resp: 19 14 16 18   Temp: 98.1 F (36.7 C) 98.2 F (36.8 C) 97.8 F (36.6 C) 97.8 F (36.6 C)  TempSrc: Oral Oral Oral Oral  SpO2: 98% 96% 99% 98%  Weight:      Height:        Exam:  alert, nad   no jvd  Chest cta bilat  Cor reg no RG  Abd soft ntnd no ascites   Ext R BKA, no edema, L foot w/ erythema and 2 ulcerated areas   Alert, NF, ox3    L AVF+bruit    OP HD: [facility]   4h 55min  450/800  108kg  2/2 bath  AVF  Hep none  - Mircera 129mcg IV q 2 wks (last 9/27) - Hectoral 20mcg IV q HD   Assessment/Plan:  L foot infection: Xray concerning for osteomyelitis - on 3 IV abx and antifungal, fevers improving.   Penile prothesis infection: sp removal on 10/5 per urology.  E. Faecalis UTI - on broad spec IV abx  ESRD: HD TTS. HD today.   HTN/volume: BP's low normal. No vol ^ on exam, up 6kg. 2 L off w/ HD yest. UF goal 2-3 L tomorrow.   Anemia: Hgb 10.1 - not due for ESA yet  Metabolic bone disease: Ca ok, Phos pending. Continue home binders  Nutrition:  Alb low, starting protein supplements.  T2DM  PAD  CAD   [name] 06/16/2021, 12:31 PM     Recent Labs  Lab 06/15/21 0120 06/16/21 0214  K 3.8 3.4*  BUN 40* 32*  CREATININE 5.36* 4.19*  CALCIUM 8.1* 7.9*  HGB 10.1* 9.6*    Inpatient medications:  atorvastatin  40 mg Oral QHS   carvedilol  37.5 mg Oral BID WC   Chlorhexidine Gluconate Cloth  6 each Topical Q0600   fentaNYL  1 patch Transdermal Q72H   insulin aspart  0-9 Units Subcutaneous Q4H   insulin glargine-yfgn  50 Units Subcutaneous QHS   mupirocin ointment  1 application Nasal BID   sodium chloride flush  3 mL Intravenous Q12H    ceFEPime (MAXIPIME) IV 2 g (06/15/21 1827)   fluconazole (DIFLUCAN) IV 200 mg (06/15/21 2109)    metronidazole 500 mg (06/16/21 0904)   vancomycin Stopped (06/15/21 1711)   acetaminophen **OR** acetaminophen, HYDROmorphone (DILAUDID) injection, ondansetron **OR** ondansetron (ZOFRAN) IV

## 2021-06-17 ENCOUNTER — Inpatient Hospital Stay (HOSPITAL_COMMUNITY): Payer: 59

## 2021-06-17 DIAGNOSIS — R6520 Severe sepsis without septic shock: Secondary | ICD-10-CM | POA: Diagnosis not present

## 2021-06-17 DIAGNOSIS — E872 Acidosis, unspecified: Secondary | ICD-10-CM | POA: Diagnosis not present

## 2021-06-17 DIAGNOSIS — A419 Sepsis, unspecified organism: Secondary | ICD-10-CM | POA: Diagnosis not present

## 2021-06-17 LAB — URINE CULTURE: Culture: >=100000 — AB

## 2021-06-17 LAB — CBC
HCT: 28 % — ABNORMAL LOW (ref 39.0–52.0)
Hemoglobin: 9.3 g/dL — ABNORMAL LOW (ref 13.0–17.0)
MCH: 30.3 pg (ref 26.0–34.0)
MCHC: 33.2 g/dL (ref 30.0–36.0)
MCV: 91.2 fL (ref 80.0–100.0)
Platelets: 248 10*3/uL (ref 150–400)
RBC: 3.07 MIL/uL — ABNORMAL LOW (ref 4.22–5.81)
RDW: 13.2 % (ref 11.5–15.5)
WBC: 9 10*3/uL (ref 4.0–10.5)
nRBC: 0 % (ref 0.0–0.2)

## 2021-06-17 LAB — GLUCOSE, CAPILLARY
Glucose-Capillary: 117 mg/dL — ABNORMAL HIGH (ref 70–99)
Glucose-Capillary: 85 mg/dL (ref 70–99)
Glucose-Capillary: 82 mg/dL (ref 70–99)
Glucose-Capillary: 139 mg/dL — ABNORMAL HIGH (ref 70–99)
Glucose-Capillary: 115 mg/dL — ABNORMAL HIGH (ref 70–99)

## 2021-06-17 LAB — APTT
aPTT: 52 seconds — ABNORMAL HIGH (ref 24–36)
aPTT: 52 seconds — ABNORMAL HIGH (ref 24–36)

## 2021-06-17 LAB — RENAL FUNCTION PANEL
Albumin: 2 g/dL — ABNORMAL LOW (ref 3.5–5.0)
Anion gap: 11 (ref 5–15)
BUN: 43 mg/dL — ABNORMAL HIGH (ref 8–23)
CO2: 25 mmol/L (ref 22–32)
Calcium: 7.7 mg/dL — ABNORMAL LOW (ref 8.9–10.3)
Chloride: 97 mmol/L — ABNORMAL LOW (ref 98–111)
Creatinine, Ser: 5.12 mg/dL — ABNORMAL HIGH (ref 0.61–1.24)
GFR, Estimated: 12 mL/min — ABNORMAL LOW (ref >60)
Glucose, Bld: 112 mg/dL — ABNORMAL HIGH (ref 70–99)
Phosphorus: 5.8 mg/dL — ABNORMAL HIGH (ref 2.5–4.6)
Potassium: 3 mmol/L — ABNORMAL LOW (ref 3.5–5.1)
Sodium: 133 mmol/L — ABNORMAL LOW (ref 135–145)

## 2021-06-17 LAB — HEPARIN LEVEL (UNFRACTIONATED)
Heparin Unfractionated: 0.33 IU/mL (ref 0.30–0.70)
Heparin Unfractionated: 0.36 IU/mL (ref 0.30–0.70)

## 2021-06-17 NOTE — Progress Notes (Signed)
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 and [name], paroxysmal A. fib on Eliquis, ESRD on hemodialysis TTS, chronic diastolic heart failure, diabetes type 2, hypertension, dyslipidemia who was sent from dialysis center to the ED for further evaluation of wound infection.  Patient is followed by [name] for chronic left foot wound infection with history of PAD, at follow-up on 10/3 he discussed potential need for transtibial amputation, also followed by [name] due to peripheral vascular disease and plan for angiogram.  Patient was noticed to have increasing swelling and pain around the penis and the scrotum, noted purulent discharge from the urethra and drainage of purulent secretion from  penile prothesis.  In the ED he was found to have leukocytosis white count 21,000, lactic acidosis. He was evaluated by [name] and underwent removal of 3 piece inflatable penile prosthesis on 06/14/2021.  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  -On cefepime and vancomycin -Fluconazole was discontinued  2-Left foot infection, abnormal signal abnormalities of  cuboid worrisome for osteomyelitis, cellulitis and mild fasciitis peripheral arterial disease, right BKA -Consulted with [name] and [name]. -Plan for arteriogram on 10/11 by [name] -Continue with IV antibiotics  3-ESRD on hemodialysis: Nephrology following Patient seen during hemodialysis today  4-Paroxysmal A. fib: Continue with Coreg Continue with heparin drip.  Holding Eliquis in anticipation of arteriogram next week  5-Diabetes type 2: Continue with Lantus Sliding scale insulin  CAD status post PCI: Continue with Coreg and atorvastatin Hypertension: Continue with carvedilol Hyponatremia, hypokalemia: Correction with hemodialysis   Estimated body mass index is 31.76 kg/m as calculated from the following:   Height as of this encounter: 6\' 2"  (1.88 m).   Weight as of this encounter: 112.2 kg.   DVT prophylaxis: SCD Code Status: Full code Family Communication: care discussed with patient Disposition Plan:  Status is: Inpatient  Remains inpatient appropriate because:Inpatient level of care appropriate due to severity of illness  Dispo: The patient is from: Home              Anticipated d/c is to: SNF              Patient currently is not medically stable to d/c.   Difficult to place patient No        Consultants:  Dr [name] Nephrology Dr [name] Dr [name].   Procedures:  Removal of 3 pieces inflatable penile prothesis  that were infected  Antimicrobials:   Cefepime 10/4  Vancomycin 10/4 Fluconazole. 10/5   Subjective: Seen during hemodialysis.  Feeling  okay, denies worsening pelvic pain. Objective: Vitals:   06/17/21 1030 06/17/21 1100 06/17/21 1130 06/17/21 1138  BP: (!) 110/52 125/76 107/63 127/73  Pulse: 68 81 78 78  Resp: 18 20 18    Temp:    97.6 F (36.4 C)   TempSrc:    Oral  SpO2:    96%  Weight:    112.2 kg  Height:        Intake/Output Summary (Last 24 hours) at 06/17/2021 1303 Last data filed at 06/17/2021 1138 Gross per 24 hour  Intake 497.9 ml  Output 2750 ml  Net -2252.1 ml    Filed Weights   06/17/21 0502 06/17/21 0854 06/17/21 1138  Weight: 114.5 kg 114.7 kg 112.2 kg    Examination:  General exam: NAD Respiratory system: CTA Cardiovascular system:  S1, s 2 RRR Gastrointestinal system: BS present, soft, nt Pelvis; he has two drain in place, supra-pubic region.  Central nervous system: Alert Extremities: right BKA, left foot with edema, dressing in place.   Data Reviewed: I have personally reviewed following labs and imaging studies  CBC: Recent Labs  Lab 06/13/21 2017 06/14/21 0213 06/15/21 0120 06/16/21 0214 06/17/21 0059  WBC 21.7* 15.2* 15.5* 16.8* 9.0  NEUTROABS 19.5*  --   --   --   --   HGB 12.1* 10.1* 10.1* 9.6* 9.3*  HCT 36.8* 30.6* 29.9* 29.7* 28.0*  MCV 91.3 93.0 92.0 92.8 91.2  PLT 358 241 219 269 546    Basic Metabolic Panel: Recent Labs  Lab 06/13/21 2017 06/14/21 0213 06/15/21 0120 06/16/21 0214 06/17/21 0836  NA 136 133* 134* 135 133*  K 4.1 4.1 3.8 3.4* 3.0*  CL 91* 94* 96* 96* 97*  CO2 26 27 24 24 25   GLUCOSE 336* 392* 280* 193* 112*  BUN 18 24* 40* 32* 43*  CREATININE 4.23* 4.28* 5.36* 4.19* 5.12*  CALCIUM 8.7* 7.8* 8.1* 7.9* 7.7*  PHOS  --   --   --   --  5.8*    GFR: Estimated Creatinine Clearance: 18.7 mL/min (A) (by C-G formula based on SCr of 5.12 mg/dL (H)). Liver Function Tests: Recent Labs  Lab 06/13/21 2017 06/17/21 0836  AST 36  --   ALT 22  --   ALKPHOS 123  --   BILITOT 1.1  --   PROT 6.8  --   ALBUMIN 2.7* 2.0*    No results for input(s): LIPASE, AMYLASE in the last 168 hours. No results for input(s): AMMONIA in the last 168 hours. Coagulation Profile: Recent Labs  Lab 06/13/21 2017 06/14/21 0213  INR 1.5* 1.5*    Cardiac Enzymes: No results  for input(s): CKTOTAL, CKMB, CKMBINDEX, TROPONINI in the last 168 hours. BNP (last 3 results) No results for input(s): PROBNP in the last 8760 hours. HbA1C: No results for input(s): HGBA1C in the last 72 hours.  CBG: Recent Labs  Lab 06/16/21 1637 06/16/21 2023 06/17/21 0026 06/17/21 0501 06/17/21 0830  GLUCAP 166* 172* 117* 85 82    Lipid Profile: No results for input(s): CHOL, HDL, LDLCALC, TRIG, CHOLHDL, LDLDIRECT in the last 72 hours. Thyroid Function Tests: No results for input(s): TSH, T4TOTAL, FREET4, T3FREE, THYROIDAB in the last 72 hours. Anemia Panel: No results for input(s): VITAMINB12, FOLATE, FERRITIN, TIBC, IRON, RETICCTPCT in the last 72 hours. Sepsis Labs: Recent Labs  Lab 06/13/21 1940 06/13/21 2200 06/14/21 0213 06/14/21 0217  PROCALCITON  --   --  3.07  --   LATICACIDVEN 3.5* 3.9*  --  2.4*     Recent Results (from the past 240 hour(s))  Blood Culture (routine x 2)     Status: None (Preliminary result)   Collection Time: 06/13/21  7:40 PM   Specimen: BLOOD  Result Value Ref Range Status   Specimen Description BLOOD RIGHT ANTECUBITAL  Final   Special Requests   Final    BOTTLES DRAWN AEROBIC AND ANAEROBIC Blood Culture results may not be optimal due to an inadequate volume of blood received in culture bottles   Culture   Final    NO GROWTH 3 DAYS Performed at [facility], [place] [address] [place], [place] [address]    Report Status PENDING  Incomplete  Resp Panel by RT-PCR (Flu A&B, Covid) Nasopharyngeal Swab     Status: None   Collection Time: 06/13/21  8:17 PM   Specimen: Nasopharyngeal Swab; Nasopharyngeal(NP) swabs in vial transport medium  Result Value Ref Range Status   SARS Coronavirus 2 by RT PCR NEGATIVE NEGATIVE Final    Comment: (NOTE) SARS-CoV-2 target nucleic acids are NOT DETECTED.  The SARS-CoV-2 RNA is generally detectable in upper respiratory specimens during the acute phase of infection. The lowest concentration of  SARS-CoV-2 viral copies this assay can detect is 138 copies/mL. A negative result does not preclude SARS-Cov-2 infection and should not be used as the sole basis for treatment or other patient management decisions. A negative result may occur with  improper specimen collection/handling, submission of specimen other than nasopharyngeal swab, presence of viral mutation(s) within the areas targeted by this assay, and inadequate number of viral copies(<138 copies/mL). A negative result must be combined with clinical observations, patient history, and epidemiological information. The expected result is Negative.  Fact Sheet for Patients:  EntrepreneurPulse.com.au  Fact Sheet for Healthcare Providers:  IncredibleEmployment.be  This test is no t yet approved or cleared by the [place] FDA and  has been authorized for detection and/or diagnosis of SARS-CoV-2 by FDA under an Emergency Use Authorization (EUA). This EUA will remain  in effect (meaning this test can be used) for the duration of the COVID-19 declaration under Section 564(b)(1) of the Act, 21 U.S.C.section 360bbb-3(b)(1), unless the authorization is terminated  or revoked sooner.       Influenza A by PCR NEGATIVE NEGATIVE Final   Influenza B by PCR NEGATIVE NEGATIVE Final    Comment: (NOTE) The Xpert Xpress SARS-CoV-2/FLU/RSV plus assay is intended as an aid in the diagnosis of influenza from Nasopharyngeal swab specimens and should not be used as a sole basis for treatment. Nasal washings and aspirates are unacceptable for Xpert Xpress SARS-CoV-2/FLU/RSV testing.  Fact Sheet for Patients: EntrepreneurPulse.com.au  Fact Sheet for Healthcare Providers: IncredibleEmployment.be  This test is not yet approved or cleared by the [place] FDA and has been authorized for detection and/or diagnosis of SARS-CoV-2 by FDA under an Emergency Use  Authorization (EUA). This EUA will remain in effect (meaning this test can be used) for the duration of the COVID-19 declaration under Section 564(b)(1) of the Act, 21 U.S.C. section 360bbb-3(b)(1), unless the authorization is terminated or revoked.  Performed at [facility], [place] [address], [place], [place] [address]   Blood Culture (routine x 2)     Status: None (Preliminary result)   Collection Time: 06/13/21  8:50 PM   Specimen: BLOOD RIGHT HAND  Result Value Ref Range Status   Specimen Description BLOOD RIGHT HAND  Final   Special Requests   Final  BOTTLES DRAWN AEROBIC AND ANAEROBIC Blood Culture adequate volume   Culture   Final    NO GROWTH 3 DAYS Performed at [facility], [place] [address] [place], [place] [address]    Report Status PENDING  Incomplete  Urine Culture     Status: Abnormal   Collection Time: 06/14/21 12:20 PM   Specimen: In/Out Cath Urine  Result Value Ref Range Status   Specimen Description IN/OUT CATH URINE  Final   Special Requests   Final    NONE Performed at [facility], [place] [address] [place], [place] [address]    Culture >=100,000 COLONIES/mL ENTEROCOCCUS FAECALIS (A)  Final   Report Status 06/17/2021 FINAL  Final   Organism ID, Bacteria ENTEROCOCCUS FAECALIS (A)  Final      Susceptibility   Enterococcus faecalis - MIC*    AMPICILLIN <=2 SENSITIVE Sensitive     NITROFURANTOIN <=16 SENSITIVE Sensitive     VANCOMYCIN 1 SENSITIVE Sensitive     * >=100,000 COLONIES/mL ENTEROCOCCUS FAECALIS  Aerobic/Anaerobic Culture w Gram Stain (surgical/deep wound)     Status: None (Preliminary result)   Collection Time: 06/14/21  8:14 PM   Specimen: PATH GI Other; Tissue  Result Value Ref Range Status   Specimen Description WOUND PENIS  Final   Special Requests DISCHARGE  Final   Gram Stain   Final    NO ORGANISMS SEEN SQUAMOUS EPITHELIAL CELLS PRESENT ABUNDANT WBC PRESENT,BOTH PMN AND MONONUCLEAR MODERATE GRAM POSITIVE COCCI     Culture   Final    FEW ENTEROCOCCUS FAECALIS FEW GROUP B STREP(S.AGALACTIAE)ISOLATED TESTING AGAINST S. AGALACTIAE NOT ROUTINELY PERFORMED DUE TO PREDICTABILITY OF AMP/PEN/VAN SUSCEPTIBILITY. Performed at [facility], [place] [address] [place], [place] [address]    Report Status PENDING  Incomplete   Organism ID, Bacteria ENTEROCOCCUS FAECALIS  Final      Susceptibility   Enterococcus faecalis - MIC*    AMPICILLIN <=2 SENSITIVE Sensitive     VANCOMYCIN 1 SENSITIVE Sensitive     GENTAMICIN SYNERGY SENSITIVE Sensitive     * FEW ENTEROCOCCUS FAECALIS  Surgical PCR screen     Status: Abnormal   Collection Time: 06/14/21 11:20 PM   Specimen: Nasal Mucosa; Nasal Swab  Result Value Ref Range Status   MRSA, PCR POSITIVE (A) NEGATIVE Final    Comment: RESULT CALLED TO, READ BACK BY AND VERIFIED WITH: RN CAREY D. 06/15/21@1 :09 BY TW    Staphylococcus aureus POSITIVE (A) NEGATIVE Final    Comment: (NOTE) The Xpert SA Assay (FDA approved for NASAL specimens in patients 49 years of age and older), is one component of a comprehensive surveillance program. It is not intended to diagnose infection nor to guide or monitor treatment. Performed at [facility], [place] [address] [place], [place] [address]           Radiology Studies: MR FOOT LEFT WO CONTRAST  Result Date: 06/16/2021 CLINICAL DATA:  Chronic diabetic foot wound. EXAM: MRI OF THE LEFT FOOT WITHOUT CONTRAST TECHNIQUE: Multiplanar, multisequence MR imaging of the left foot was performed. No intravenous contrast was administered. COMPARISON:  Radiographs 06/13/2021 FINDINGS: There is a lateral and plantar foot wound extending right down close to the cuboid and base of the fifth metatarsal. Small locules of gas are suspected. I do not see a discrete fluid collection to suggest a drainable abscess but there are changes of cellulitis involving the foot and ankle. Myofasciitis mainly involving the short flexor muscles of the foot  without definite findings for pyomyositis. Abnormal T2 signal intensity in the cuboid is worrisome for osteomyelitis. I do not see any other sites suspicious for osteomyelitis or septic arthritis. Moderate degenerative changes throughout the foot. Markedly thickened peroneal tendons possibly suggesting chronic tendinopathy. Hindfoot varus noted. IMPRESSION: 1. Lateral and plantar foot wound extending down close to the cuboid and base of the fifth metatarsal. Small locules of gas are suspected. 2. Abnormal T2 signal intensity in the cuboid worrisome for osteomyelitis. 3. Cellulitis and myofasciitis without focal drainable soft tissue abscess or pyomyositis. Electronically Signed   By: Marijo Sanes M.D.   On: 06/16/2021 08:22        Scheduled Meds:  atorvastatin  40 mg Oral QHS   carvedilol  37.5 mg Oral BID WC   Chlorhexidine Gluconate Cloth  6 each Topical Q0600   fentaNYL  1 patch Transdermal Q72H   insulin aspart  0-9 Units Subcutaneous Q4H   insulin glargine-yfgn  50 Units Subcutaneous QHS   mupirocin ointment  1 application Nasal BID   sodium chloride flush  3 mL Intravenous Q12H   Continuous Infusions:  ceFEPime (MAXIPIME) IV 2 g (06/15/21 1827)   heparin 1,550 Units/hr (06/17/21 0230)   metronidazole Stopped (06/16/21 2239)   vancomycin 1,000 mg (06/17/21 1037)     LOS: 3 days    Time spent: 35 minutes.     Elmarie Shiley, MD [facility]   If 7PM-7AM, please contact night-coverage [email or web address]  06/17/2021, 1:03 PM

## 2021-06-17 NOTE — Progress Notes (Addendum)
ANTICOAGULATION CONSULT NOTE  Pharmacy Consult for heparin Indication: atrial fibrillation  Allergies  Allergen Reactions   Contrast Media [Iodinated Diagnostic Agents]     Difficulty breathing     Ivp Dye [Iodinated Diagnostic Agents] Anaphylaxis and Other (See Comments)    Breathing problems, altered mental state    Adhesive [Tape] Rash    Rash after 3-4 days use    Patient Measurements: Height: 6\' 2"  (188 cm) Weight: 114.8 kg (253 lb 1.4 oz) IBW/kg (Calculated) : 82.2 Heparin Dosing Weight: 105kg  Vital Signs: Temp: 98.6 F (37 C) (10/08 0023) Temp Source: Oral (10/08 0023) BP: 109/60 (10/08 0023) Pulse Rate: 73 (10/08 0023)  Labs: Recent Labs    06/14/21 0213 06/15/21 0120 06/16/21 0214 06/17/21 0059  HGB 10.1* 10.1* 9.6* 9.3*  HCT 30.6* 29.9* 29.7* 28.0*  PLT 241 219 269 248  APTT  --   --   --  52*  LABPROT 17.9*  --   --   --   INR 1.5*  --   --   --   HEPARINUNFRC  --   --   --  0.33  CREATININE 4.28* 5.36* 4.19*  --      Estimated Creatinine Clearance: 23 mL/min (A) (by C-G formula based on SCr of 4.19 mg/dL (H)).   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   LEFT CAROTID ENDARTERECTOMY   Chronic kidney disease    Complication of anesthesia    BP WENT UP AT [facility] "   COPD (chronic obstructive pulmonary disease) ([place])    pt denies this dx as of 06/01/20 - no inhaler    Diabetes mellitus without complication ([place])    Diverticulitis    Diverticulosis of colon (without mention of hemorrhage)    DJD (degenerative joint disease)    knees/hands/feet/back/neck   Fatty liver    Full dentures    GERD (gastroesophageal reflux disease)    H/O hiatal hernia    History of blood transfusion    with a past surical procedure per patient 06/01/20   Hyperlipidemia    Hypertension    Neuromuscular disorder ([place])    peripheral neuropathy   Non-pressure chronic ulcer of other part of left foot limited to  breakdown of skin ([place]) 11/12/2016   Osteomyelitis (HCC)    left 5th metatarsal   PAD (peripheral artery disease) ([place])    Distal aortogram June 2012. Atherectomy left popliteal artery July 2012.    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 He is s/p procedure on 105 and has L foot osteomyelitis and plans for possible procedures. He is noted with ESRD on HD. -hg= 9.6  10/8 AM update:  APTT low, close to  correlating with heparin level Hgb 9.3  Goal of Therapy:  Heparin level 0.3-0.7 units/ml Monitor platelets by anticoagulation protocol: Yes   Plan:  -No heparin bolus with recent procedure -Inc heparin to 1550 units/hr -1000 heparin level and aPTT  [name], PharmD, BCPS Clinical Pharmacist Phone: [phone]

## 2021-06-17 NOTE — Progress Notes (Signed)
OT Cancellation Note  Patient Details Name: ERVINE WITUCKI MRN: [id number] DOB: [birth year]   Cancelled Treatment:    Reason Eval/Treat Not Completed: Patient declined, no reason specified.  OT to continue efforts as appropriate.    Miki Labuda D Krystelle Prashad 06/17/2021, 3:31 PM

## 2021-06-17 NOTE — Plan of Care (Signed)
  Problem: Clinical Measurements: Goal: Postoperative complications will be avoided or minimized Outcome: Progressing   Problem: Skin Integrity: Goal: Demonstration of wound healing without infection will improve Outcome: Progressing   Problem: Fluid Volume: Goal: Hemodynamic stability will improve Outcome: Progressing   Problem: Clinical Measurements: Goal: Diagnostic test results will improve Outcome: Progressing Goal: Signs and symptoms of infection will decrease Outcome: Progressing   Problem: Fluid Volume: Goal: Compliance with measures to maintain balanced fluid volume will improve Outcome: Progressing   Problem: Nutritional: Goal: Ability to make healthy dietary choices will improve Outcome: Progressing   Problem: Clinical Measurements: Goal: Complications related to the disease process, condition or treatment will be avoided or minimized Outcome: Progressing   Problem: Metabolic: Goal: Ability to maintain appropriate glucose levels will improve Outcome: Progressing   Problem: Tissue Perfusion: Goal: Adequacy of tissue perfusion will improve Outcome: Progressing   Problem: Elimination: Goal: Will not experience complications related to bowel motility Outcome: Progressing Goal: Will not experience complications related to urinary retention Outcome: Progressing   Problem: Safety: Goal: Ability to remain free from injury will improve Outcome: Progressing

## 2021-06-17 NOTE — Progress Notes (Signed)
[facility] Progress Note  Subjective: seen on HD, no /co  Vitals:   06/17/21 1000 06/17/21 1030 06/17/21 1100 06/17/21 1130  BP: 113/62 (!) 110/52 125/76 107/63  Pulse: 66 68 81 78  Resp:      Temp:      TempSrc:      SpO2:      Weight:      Height:        Exam:  alert, nad   no jvd  Chest cta bilat  Cor reg no RG  Abd soft ntnd no ascites   Ext R BKA, no edema, L foot w/ erythema and 2 ulcerated areas   Alert, NF, ox3    L AVF+bruit    [facility] HD: [facility]   4h 80min  450/800  108kg  2/2 bath  AVF  Hep none  - Mircera 129mcg IV q 2 wks (last 9/27) - Hectoral 38mcg IV q HD   Assessment/Plan:  L foot infection+ penile prosthesis infection: prosthesis removed by urology on 10/5. 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  Urine cx growing enterococcus faecalis.  Seen by Dr [name] for PAD. Planning arteriogram next week. IV abx per urology/ pmd  ESRD: HD TTS. HD today.   HTN/volume: BP's low normal. +LE > UE mild edema, and up 6kg. UF goal 2-3 L w/ HD today  Anemia: Hgb 10.1 - not due for ESA yet  Metabolic bone disease: Ca ok, Phos pending. Continue home binders  Nutrition:  Alb low, starting protein supplements.  T2DM  PAD  CAD   Rob Freyja Govea 06/17/2021, 11:35 AM     Recent Labs  Lab 06/16/21 0214 06/17/21 0059 06/17/21 0836  K 3.4*  --  3.0*  BUN 32*  --  43*  CREATININE 4.19*  --  5.12*  CALCIUM 7.9*  --  7.7*  PHOS  --   --  5.8*  HGB 9.6* 9.3*  --     Inpatient medications:  atorvastatin  40 mg Oral QHS   carvedilol  37.5 mg Oral BID WC   Chlorhexidine Gluconate Cloth  6 each Topical Q0600   fentaNYL  1 patch Transdermal Q72H   insulin aspart  0-9 Units Subcutaneous Q4H   insulin glargine-yfgn  50 Units Subcutaneous QHS   mupirocin ointment  1 application Nasal BID   sodium chloride flush  3 mL Intravenous Q12H    ceFEPime (MAXIPIME) IV 2 g (06/15/21 1827)   heparin 1,550 Units/hr (06/17/21 0230)   metronidazole Stopped  (06/16/21 2239)   vancomycin 1,000 mg (06/17/21 1037)   acetaminophen **OR** acetaminophen, HYDROmorphone (DILAUDID) injection, ondansetron **OR** ondansetron (ZOFRAN) IV

## 2021-06-17 NOTE — Progress Notes (Signed)
  Echocardiogram 2D Echocardiogram has been performed.  [name] 06/17/2021, 3:23 PM

## 2021-06-17 NOTE — Progress Notes (Addendum)
ANTICOAGULATION CONSULT NOTE  Pharmacy Consult for heparin Indication: atrial fibrillation  Allergies  Allergen Reactions   Contrast Media [Iodinated Diagnostic Agents]     Difficulty breathing     Ivp Dye [Iodinated Diagnostic Agents] Anaphylaxis and Other (See Comments)    Breathing problems, altered mental state    Adhesive [Tape] Rash    Rash after 3-4 days use    Patient Measurements: Height: 6\' 2"  (188 cm) Weight: 112.2 kg (247 lb 5.7 oz) IBW/kg (Calculated) : 82.2 Heparin Dosing Weight: 105kg  Vital Signs: Temp: 97.6 F (36.4 C) (10/08 1138) Temp Source: Oral (10/08 1138) BP: 127/73 (10/08 1138) Pulse Rate: 78 (10/08 1138)  Labs: Recent Labs    06/15/21 0120 06/16/21 0214 06/17/21 0059 06/17/21 0836  HGB 10.1* 9.6* 9.3*  --   HCT 29.9* 29.7* 28.0*  --   PLT 219 269 248  --   APTT  --   --  52*  --   HEPARINUNFRC  --   --  0.33  --   CREATININE 5.36* 4.19*  --  5.12*     Estimated Creatinine Clearance: 18.7 mL/min (A) (by C-G formula based on SCr of 5.12 mg/dL (H)).   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   LEFT CAROTID ENDARTERECTOMY   Chronic kidney disease    Complication of anesthesia    BP WENT UP AT [facility] "   COPD (chronic obstructive pulmonary disease) ([place])    pt denies this dx as of 06/01/20 - no inhaler    Diabetes mellitus without complication ([place])    Diverticulitis    Diverticulosis of colon (without mention of hemorrhage)    DJD (degenerative joint disease)    knees/hands/feet/back/neck   Fatty liver    Full dentures    GERD (gastroesophageal reflux disease)    H/O hiatal hernia    History of blood transfusion    with a past surical procedure per patient 06/01/20   Hyperlipidemia    Hypertension    Neuromuscular disorder ([place])    peripheral neuropathy   Non-pressure chronic ulcer of other part of left foot limited to breakdown of skin ([place]) 11/12/2016   Osteomyelitis (HCC)     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 Atherectomy left popliteal artery July 2012.    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 He is s/p procedure on 10/5 and has L foot osteomyelitis and plans for possible procedures. He is noted with ESRD on HD, so plan to adjust conservatively. CBC stable. Heparin level not drawn this morning, so stat heparin level ordered. Spoke with lab and patient is a hard stick. Attempting once  more to obtain lab.  Goal of Therapy:  Heparin level 0.3-0.7 units/ml Monitor platelets by anticoagulation protocol: Yes   Plan:  -Continue current rate until aPTT results -Daily CBC  Thank you for allowing pharmacy to participate in this patient's care.  Reatha Harps, PharmD PGY1 Pharmacy Resident 06/17/2021 1:04 PM Check [email or web address] for unit specific pharmacy number

## 2021-06-17 NOTE — Progress Notes (Deleted)
ANTICOAGULATION CONSULT NOTE  Pharmacy Consult for heparin Indication: atrial fibrillation  Allergies  Allergen Reactions   Contrast Media [Iodinated Diagnostic Agents]     Difficulty breathing     Ivp Dye [Iodinated Diagnostic Agents] Anaphylaxis and Other (See Comments)    Breathing problems, altered mental state    Adhesive [Tape] Rash    Rash after 3-4 days use    Patient Measurements: Height: 6\' 2"  (188 cm) Weight: 112.2 kg (247 lb 5.7 oz) IBW/kg (Calculated) : 82.2 Heparin Dosing Weight: 105kg  Vital Signs: Temp: 97.6 F (36.4 C) (10/08 1138) Temp Source: Oral (10/08 1138) BP: 127/73 (10/08 1138) Pulse Rate: 78 (10/08 1138)  Labs: Recent Labs    06/15/21 0120 06/16/21 0214 06/17/21 0059 06/17/21 0836  HGB 10.1* 9.6* 9.3*  --   HCT 29.9* 29.7* 28.0*  --   PLT 219 269 248  --   APTT  --   --  52*  --   HEPARINUNFRC  --   --  0.33  --   CREATININE 5.36* 4.19*  --  5.12*     Estimated Creatinine Clearance: 18.7 mL/min (A) (by C-G formula based on SCr of 5.12 mg/dL (H)).   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   LEFT CAROTID ENDARTERECTOMY   Chronic kidney disease    Complication of anesthesia    BP WENT UP AT [facility] "   COPD (chronic obstructive pulmonary disease) ([place])    pt denies this dx as of 06/01/20 - no inhaler    Diabetes mellitus without complication ([place])    Diverticulitis    Diverticulosis of colon (without mention of hemorrhage)    DJD (degenerative joint disease)    knees/hands/feet/back/neck   Fatty liver    Full dentures    GERD (gastroesophageal reflux disease)    H/O hiatal hernia    History of blood transfusion    with a past surical procedure per patient 06/01/20   Hyperlipidemia    Hypertension    Neuromuscular disorder ([place])    peripheral neuropathy   Non-pressure chronic ulcer of other part of left foot limited to breakdown of skin ([place]) 11/12/2016   Osteomyelitis (HCC)     left 5th metatarsal   PAD (peripheral artery disease) ([place])    Distal aortogram June 2012. Atherectomy left popliteal artery July 2012.    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 He is s/p procedure on 10/5 and has L foot osteomyelitis and plans for possible procedures. He is noted with ESRD on HD, so plan to adjust conservatively. CBC stable. APTT low and heparin level does not yet correlate, although likely will soon.  Goal of Therapy:  Heparin level 0.3-0.7 units/ml  Monitor platelets by anticoagulation protocol: Yes   Plan:  -Increase heparin to 1700 units/hr -2200 heparin level and aPTT -Daily CBC  Thank you for allowing pharmacy to participate in this patient's care.  Reatha Harps, PharmD PGY1 Pharmacy Resident 06/17/2021 12:58 PM Check [email or web address] for unit specific pharmacy number

## 2021-06-17 NOTE — Progress Notes (Signed)
PT Cancellation Note  Patient Details Name: [name] MRN: [id number] DOB: 1955-12-21   Cancelled Treatment:    Reason Eval/Treat Not Completed: Pain limiting ability to participate;Patient declined, no reason specified. Pt declined x2 today due to reports of fatigue following HD. Will continue to follow and attempt to evaluate as pt able.   [place] Carbo, PT, DPT   Acute Rehabilitation Department Pager #: [phone]   Sandra Cockayne 06/17/2021, 3:39 PM

## 2021-06-17 NOTE — Progress Notes (Signed)
ANTICOAGULATION CONSULT NOTE  Pharmacy Consult for heparin Indication: atrial fibrillation  Allergies  Allergen Reactions   Contrast Media [Iodinated Diagnostic Agents]     Difficulty breathing     Ivp Dye [Iodinated Diagnostic Agents] Anaphylaxis and Other (See Comments)    Breathing problems, altered mental state    Adhesive [Tape] Rash    Rash after 3-4 days use    Patient Measurements: Height: 6\' 2"  (188 cm) Weight: 112.2 kg (247 lb 5.7 oz) IBW/kg (Calculated) : 82.2 Heparin Dosing Weight: 105kg  Vital Signs: Temp: 98.4 F (36.9 C) (10/08 1559) Temp Source: Oral (10/08 1559) BP: 135/62 (10/08 1559) Pulse Rate: 79 (10/08 1559)  Labs: Recent Labs    06/15/21 0120 06/16/21 0214 06/17/21 0059 06/17/21 0836 06/17/21 1442  HGB 10.1* 9.6* 9.3*  --   --   HCT 29.9* 29.7* 28.0*  --   --   PLT 219 269 248  --   --   APTT  --   --  52*  --  52*  HEPARINUNFRC  --   --  0.33  --  0.36  CREATININE 5.36* 4.19*  --  5.12*  --      Estimated Creatinine Clearance: 18.7 mL/min (A) (by C-G formula based on SCr of 5.12 mg/dL (H)).   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   LEFT CAROTID ENDARTERECTOMY   Chronic kidney disease    Complication of anesthesia    BP WENT [facility] AT [facility] "   COPD (chronic obstructive pulmonary disease) ([place])    pt denies this dx as of 06/01/20 - no inhaler    Diabetes mellitus without complication ([place])    Diverticulitis    Diverticulosis of colon (without mention of hemorrhage)    DJD (degenerative joint disease)    knees/hands/feet/back/neck   Fatty liver    Full dentures    GERD (gastroesophageal reflux disease)    H/O hiatal hernia    History of blood transfusion    with a past surical procedure per patient 06/01/20   Hyperlipidemia    Hypertension    Neuromuscular disorder ([place])    peripheral neuropathy   Non-pressure chronic ulcer of other part of left foot limited to breakdown of  skin ([place]) 11/12/2016   Osteomyelitis (HCC)    left 5th metatarsal   PAD (peripheral artery disease) ([place])    Distal aortogram June 2012. Atherectomy left popliteal artery July 2012.    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 He is s/p procedure on 10/5 and has L foot osteomyelitis and plans for possible procedures. He is noted with ESRD on HD. Per lab patient is a difficult stick for collection -heparin level 0.36, aPTT=  52 -heparin level increased after last dose increase. Levels have been consistent so will continue to use heparin level only   Goal of Therapy:  Heparin level 0.3-0.7 units/ml Monitor platelets by anticoagulation protocol: Yes   Plan:  -Continue heparin at 1550 units/hr -Daily heparin level and CBC -discontinue aPTTs  Hildred Laser, PharmD Clinical Pharmacist **Pharmacist phone directory can now be found on [email or web address] ([email or web address]).  Listed under [place].

## 2021-06-17 NOTE — Progress Notes (Signed)
Pt back from HD.  Lunch ordered.  Will cont plan of care

## 2021-06-18 ENCOUNTER — Other Ambulatory Visit: Payer: Self-pay

## 2021-06-18 ENCOUNTER — Inpatient Hospital Stay (HOSPITAL_COMMUNITY): Payer: 59

## 2021-06-18 DIAGNOSIS — I739 Peripheral vascular disease, unspecified: Secondary | ICD-10-CM

## 2021-06-18 DIAGNOSIS — R6520 Severe sepsis without septic shock: Secondary | ICD-10-CM | POA: Diagnosis not present

## 2021-06-18 DIAGNOSIS — L0390 Cellulitis, unspecified: Secondary | ICD-10-CM

## 2021-06-18 DIAGNOSIS — A419 Sepsis, unspecified organism: Secondary | ICD-10-CM | POA: Diagnosis not present

## 2021-06-18 DIAGNOSIS — E872 Acidosis, unspecified: Secondary | ICD-10-CM | POA: Diagnosis not present

## 2021-06-18 LAB — GLUCOSE, CAPILLARY
Glucose-Capillary: 100 mg/dL — ABNORMAL HIGH (ref 70–99)
Glucose-Capillary: 199 mg/dL — ABNORMAL HIGH (ref 70–99)
Glucose-Capillary: 223 mg/dL — ABNORMAL HIGH (ref 70–99)
Glucose-Capillary: 278 mg/dL — ABNORMAL HIGH (ref 70–99)
Glucose-Capillary: 65 mg/dL — ABNORMAL LOW (ref 70–99)
Glucose-Capillary: 69 mg/dL — ABNORMAL LOW (ref 70–99)
Glucose-Capillary: 93 mg/dL (ref 70–99)
Glucose-Capillary: 96 mg/dL (ref 70–99)

## 2021-06-18 LAB — BASIC METABOLIC PANEL
Anion gap: 13 (ref 5–15)
BUN: 32 mg/dL — ABNORMAL HIGH (ref 8–23)
CO2: 25 mmol/L (ref 22–32)
Calcium: 7.8 mg/dL — ABNORMAL LOW (ref 8.9–10.3)
Chloride: 95 mmol/L — ABNORMAL LOW (ref 98–111)
Creatinine, Ser: 4.71 mg/dL — ABNORMAL HIGH (ref 0.61–1.24)
GFR, Estimated: 13 mL/min — ABNORMAL LOW (ref >60)
Glucose, Bld: 217 mg/dL — ABNORMAL HIGH (ref 70–99)
Potassium: 3.3 mmol/L — ABNORMAL LOW (ref 3.5–5.1)
Sodium: 133 mmol/L — ABNORMAL LOW (ref 135–145)

## 2021-06-18 LAB — CULTURE, BLOOD (ROUTINE X 2)
Culture: NO GROWTH
Culture: NO GROWTH
Special Requests: ADEQUATE

## 2021-06-18 LAB — CBC
HCT: 29.5 % — ABNORMAL LOW (ref 39.0–52.0)
Hemoglobin: 9.7 g/dL — ABNORMAL LOW (ref 13.0–17.0)
MCH: 29.8 pg (ref 26.0–34.0)
MCHC: 32.9 g/dL (ref 30.0–36.0)
MCV: 90.8 fL (ref 80.0–100.0)
Platelets: 211 10*3/uL (ref 150–400)
RBC: 3.25 MIL/uL — ABNORMAL LOW (ref 4.22–5.81)
RDW: 13.1 % (ref 11.5–15.5)
WBC: 6.7 10*3/uL (ref 4.0–10.5)
nRBC: 0 % (ref 0.0–0.2)

## 2021-06-18 LAB — HEPARIN LEVEL (UNFRACTIONATED)
Heparin Unfractionated: 0.19 IU/mL — ABNORMAL LOW (ref 0.30–0.70)
Heparin Unfractionated: 0.39 IU/mL (ref 0.30–0.70)

## 2021-06-18 LAB — APTT: aPTT: 56 seconds — ABNORMAL HIGH (ref 24–36)

## 2021-06-18 NOTE — Progress Notes (Signed)
Urology Inpatient Progress Report  Sepsis, due to unspecified organism, unspecified whether acute organ dysfunction present ([place]) [A41.9] Severe sepsis with lactic acidosis ([place]) [A41.9, R65.20, E87.20]  Procedure(s): Removal of THREE piece inflatable penile prosthesis  4 Days Post-Op   Intv/Subj: No acute events overnight. Patient is without complaint.  Pain and discomfort are improved.  Minimal JP drainage.    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[place])   ESRD on hemodialysis ([place])   Paroxysmal atrial fibrillation with RVR ([place])  Current Facility-Administered Medications  Medication Dose Route Frequency Provider Last Rate Last Admin   acetaminophen (TYLENOL) tablet 1,000 mg  1,000 mg Oral Q6H PRN Zada Finders R, MD   1,000 mg at 06/14/21 1802   Or   acetaminophen (TYLENOL) suppository 650 mg  650 mg Rectal Q6H PRN Lenore Cordia, MD       atorvastatin (LIPITOR) tablet 40 mg  40 mg Oral QHS Zada Finders R, MD   40 mg at 06/17/21 2147   carvedilol (COREG) tablet 37.5 mg  37.5 mg Oral BID WC Zada Finders R, MD   37.5 mg at 06/18/21 1035   ceFEPIme (MAXIPIME) 2 g in sodium chloride 0.9 % 100 mL IVPB  2 g Intravenous Q T,Th,Sat-1800 von Dohlen, Stasia Cavalier, RPH   Stopped at 06/17/21 1728   Chlorhexidine Gluconate Cloth 2 % PADS 6 each  6 each Topical Q0600 Loren Racer, PA-C   6 each at 06/18/21 0554   fentaNYL (DURAGESIC) 50 MCG/HR 1 patch  1 patch Transdermal Q72H Chotiner, Yevonne Aline, MD   1 patch at 06/16/21 0404   heparin ADULT infusion 100 units/mL (25000 units/236mL)  1,700 Units/hr Intravenous  Continuous Erenest Blank, RPH 17 mL/hr at 06/18/21 0406 1,700 Units/hr at 06/18/21 0406   HYDROmorphone (DILAUDID) injection 1 mg  1 mg Intravenous Q4H PRN Zada Finders R, MD   1 mg at 06/17/21 0539   insulin aspart (novoLOG) injection 0-9 Units  0-9 Units Subcutaneous Q4H Zada Finders R, MD   2 Units at 06/18/21 1041   insulin glargine-yfgn (SEMGLEE) injection 50 Units  50 Units Subcutaneous QHS Oswald Hillock, MD   50 Units at 06/17/21 2157   metroNIDAZOLE (FLAGYL) IVPB 500 mg  500 mg Intravenous Q12H Zada Finders R, MD 100 mL/hr at 06/18/21 1041 500 mg at 06/18/21 1041   mupirocin ointment (BACTROBAN) 2 % 1 application  1 application Nasal BID Chotiner, Yevonne Aline, MD   1 application at 41/96/22 1042   ondansetron (ZOFRAN) tablet 4 mg  4 mg Oral Q6H PRN Lenore Cordia, MD       Or   ondansetron (ZOFRAN) injection 4 mg  4 mg Intravenous Q6H PRN Zada Finders R, MD       sodium chloride flush (NS) 0.9 % injection 3 mL  3 mL Intravenous Q12H Zada Finders R, MD   3 mL at 06/17/21 2147   vancomycin (VANCOCIN) IVPB 1000 mg/200 mL premix  1,000 mg Intravenous Q T,Th,Sa-HD Franky Macho, RPH 200 mL/hr at 06/17/21 1037 1,000 mg at 06/17/21 1037     Objective: Vital: Vitals:   06/18/21 0445  06/18/21 0556 06/18/21 0910 06/18/21 1110  BP: (!) 152/83  (!) 145/80 (!) 148/64  Pulse: 70  88 71  Resp: 20  20 18   Temp: (!) 97.4 F (36.3 C)  98.3 F (36.8 C) 98.3 F (36.8 C)  TempSrc: Oral  Oral Oral  SpO2: 100%  100%   Weight:  115.2 kg    Height:       I/Os: I/O last 3 completed shifts: In: 2046.2 [P.O.:240; I.V.:508.2; IV Piggyback:1298] Out: 0254 [Urine:875; Drains:30; Other:2500]  Physical Exam:  General: Patient is in no apparent distress Lungs: Normal respiratory effort, chest expands symmetrically. GI: The abdomen is soft and nontender without mass. JP drain with minimal serosanguinous drainage Foley: Draining clear yellow urine Genitourinary: Patient has some improving mild  penile edema.  He states he is circumcised.  Scrotum with some erythema but no fluctuance or crepitus.  Some mild induration in the right anterior scrotum.  The staple incision line is clean dry and intact.  No purulence and only mild erythema.  Bilateral JP drains in place with small amount of drainage. Ext: lower extremities symmetric  Lab Results: Recent Labs    06/16/21 0214 06/17/21 0059 06/18/21 0058  WBC 16.8* 9.0 6.7  HGB 9.6* 9.3* 9.7*  HCT 29.7* 28.0* 29.5*   Recent Labs    06/16/21 0214 06/17/21 0836 06/18/21 0715  NA 135 133* 133*  K 3.4* 3.0* 3.3*  CL 96* 97* 95*  CO2 24 25 25   GLUCOSE 193* 112* 217*  BUN 32* 43* 32*  CREATININE 4.19* 5.12* 4.71*  CALCIUM 7.9* 7.7* 7.8*   No results for input(s): LABPT, INR in the last 72 hours.  No results for input(s): LABURIN in the last 72 hours. Results for orders placed or performed during the hospital encounter of 06/13/21  Blood Culture (routine x 2)     Status: None   Collection Time: 06/13/21  7:40 PM   Specimen: BLOOD  Result Value Ref Range Status   Specimen Description BLOOD RIGHT ANTECUBITAL  Final   Special Requests   Final    BOTTLES DRAWN AEROBIC AND ANAEROBIC Blood Culture results may not be optimal due to an inadequate volume of blood received in culture bottles   Culture   Final    NO GROWTH 5 DAYS Performed at [facility], [place] [address] [place], [place] [address]    Report Status 06/18/2021 FINAL  Final  Resp Panel by RT-PCR (Flu A&B, Covid) Nasopharyngeal Swab     Status: None   Collection Time: 06/13/21  8:17 PM   Specimen: Nasopharyngeal Swab; Nasopharyngeal(NP) swabs in vial transport medium  Result Value Ref Range Status   SARS Coronavirus 2 by RT PCR NEGATIVE NEGATIVE Final    Comment: (NOTE) SARS-CoV-2 target nucleic acids are NOT DETECTED.  The SARS-CoV-2 RNA is generally detectable in upper respiratory specimens during the acute phase of infection. The lowest concentration of  SARS-CoV-2 viral copies this assay can detect is 138 copies/mL. A negative result does not preclude SARS-Cov-2 infection and should not be used as the sole basis for treatment or other patient management decisions. A negative result may occur with  improper specimen collection/handling, submission of specimen other than nasopharyngeal swab, presence of viral mutation(s) within the areas targeted by this assay, and inadequate number of viral copies(<138 copies/mL). A negative result must be combined with clinical observations, patient history, and epidemiological information. The expected result is Negative.  Fact Sheet for Patients:  EntrepreneurPulse.com.au  Fact Sheet  for Healthcare Providers:  IncredibleEmployment.be  This test is no t yet approved or cleared by the [place] and  has been authorized for detection and/or diagnosis of SARS-CoV-2 by FDA under an Emergency Use Authorization (EUA). This EUA will remain  in effect (meaning this test can be used) for the duration of the COVID-19 declaration under Section 564(b)(1) of the Act, 21 U.S.C.section 360bbb-3(b)(1), unless the authorization is terminated  or revoked sooner.       Influenza A by PCR NEGATIVE NEGATIVE Final   Influenza B by PCR NEGATIVE NEGATIVE Final    Comment: (NOTE) The Xpert Xpress SARS-CoV-2/FLU/RSV plus assay is intended as an aid in the diagnosis of influenza from Nasopharyngeal swab specimens and should not be used as a sole basis for treatment. Nasal washings and aspirates are unacceptable for Xpert Xpress SARS-CoV-2/FLU/RSV testing.  Fact Sheet for Patients: EntrepreneurPulse.com.au  Fact Sheet for Healthcare Providers: IncredibleEmployment.be  This test is not yet approved or cleared by the [place] FDA and has been authorized for detection and/or diagnosis of SARS-CoV-2 by FDA under an Emergency Use  Authorization (EUA). This EUA will remain in effect (meaning this test can be used) for the duration of the COVID-19 declaration under Section 564(b)(1) of the Act, 21 U.S.C. section 360bbb-3(b)(1), unless the authorization is terminated or revoked.  Performed at [facility], [place] [address] [place], [place] [address]   Blood Culture (routine x 2)     Status: None   Collection Time: 06/13/21  8:50 PM   Specimen: BLOOD RIGHT HAND  Result Value Ref Range Status   Specimen Description BLOOD RIGHT HAND  Final   Special Requests   Final    BOTTLES DRAWN AEROBIC AND ANAEROBIC Blood Culture adequate volume   Culture   Final    NO GROWTH 5 DAYS Performed at [facility], [place] [address], [place], [place] [address]    Report Status 06/18/2021 FINAL  Final  Urine Culture     Status: Abnormal   Collection Time: 06/14/21 12:20 PM   Specimen: In/Out Cath Urine  Result Value Ref Range Status   Specimen Description IN/OUT CATH URINE  Final   Special Requests   Final    NONE Performed at [facility], [place] [address] [place], [place] [address]    Culture >=100,000 COLONIES/mL ENTEROCOCCUS FAECALIS (A)  Final   Report Status 06/17/2021 FINAL  Final   Organism ID, Bacteria ENTEROCOCCUS FAECALIS (A)  Final      Susceptibility   Enterococcus faecalis - MIC*    AMPICILLIN <=2 SENSITIVE Sensitive     NITROFURANTOIN <=16 SENSITIVE Sensitive     VANCOMYCIN 1 SENSITIVE Sensitive     * >=100,000 COLONIES/mL ENTEROCOCCUS FAECALIS  Aerobic/Anaerobic Culture w Gram Stain (surgical/deep wound)     Status: None (Preliminary result)   Collection Time: 06/14/21  8:14 PM   Specimen: PATH GI Other; Tissue  Result Value Ref Range Status   Specimen Description WOUND PENIS  Final   Special Requests DISCHARGE  Final   Gram Stain   Final    NO ORGANISMS SEEN SQUAMOUS EPITHELIAL CELLS PRESENT ABUNDANT WBC PRESENT,BOTH PMN AND MONONUCLEAR MODERATE GRAM POSITIVE COCCI Performed at [facility], [address] [place], [place] [address]    Culture   Final    FEW ENTEROCOCCUS FAECALIS FEW GROUP B STREP(S.AGALACTIAE)ISOLATED TESTING AGAINST S. AGALACTIAE NOT ROUTINELY PERFORMED DUE TO PREDICTABILITY OF AMP/PEN/VAN SUSCEPTIBILITY. NO ANAEROBES ISOLATED; CULTURE IN  PROGRESS FOR 5 DAYS    Report Status PENDING  Incomplete   Organism ID, Bacteria ENTEROCOCCUS FAECALIS  Final      Susceptibility   Enterococcus faecalis - MIC*    AMPICILLIN <=2 SENSITIVE Sensitive     VANCOMYCIN 1 SENSITIVE Sensitive     GENTAMICIN SYNERGY SENSITIVE Sensitive     * FEW ENTEROCOCCUS FAECALIS  Fungus Culture With Stain     Status: None (Preliminary result)   Collection Time: 06/14/21  8:14 PM  Result Value Ref Range Status   Fungus Stain Final report  Final    Comment: (NOTE) Performed At: [facility] [place], [place] [id number] Rush Farmer MD [id number]    Fungus (Mycology) Culture PENDING  Incomplete   Fungal Source WOUND  Final    Comment: PENIS DISHARGE Performed at [facility], [place] [address], [place], [place] [address]   Fungus Culture Result     Status: None   Collection Time: 06/14/21  8:14 PM  Result Value Ref Range Status   Result 1 Comment  Final    Comment: (NOTE) KOH/Calcofluor preparation:  no fungus observed. Performed At: [facility] [place], [place] [id number] Rush Farmer MD [id number]   Surgical PCR screen     Status: Abnormal   Collection Time: 06/14/21 11:20 PM   Specimen: Nasal Mucosa; Nasal Swab  Result Value Ref Range Status   MRSA, PCR POSITIVE (A) NEGATIVE Final    Comment: RESULT CALLED TO, READ BACK BY AND VERIFIED WITH: RN CAREY D. 06/15/21@1 :09 BY TW    Staphylococcus aureus POSITIVE (A) NEGATIVE Final    Comment: (NOTE) The Xpert SA Assay (FDA approved for NASAL specimens in patients 72 years of age and older), is one component of a comprehensive surveillance program. It is not  intended to diagnose infection nor to guide or monitor treatment. Performed at [facility], [place] [address], [place], [place] [address]     Studies/Results: No results found.  Assessment: Infected inflatable penile prosthesis  Procedure(s): Removal of THREE piece inflatable penile prosthesis, 4 Days Post-Op  doing well.  Plan: Remove JP drains but he will need to keep the foley for at least another week.    06/18/2021, 11:41 AM Patient ID: [name], male   DOB: 07-10-1956, 65 y.o.   MRN: [id number]

## 2021-06-18 NOTE — Progress Notes (Signed)
PT Cancellation Note  Patient Details Name: [name] MRN: [id number] DOB: November 12, 1955   Cancelled Treatment:    Reason Eval/Treat Not Completed: Pain limiting ability to participate;Patient declined, no reason specified. The pt continues to adamantly refuse attempts at mobility evaluation by PT/OT. Pt states he is limited by pain, but when offered IV pain meds he continues to refuse. Pt educated on risks inherent in prolonged bedrest, verbalizes understanding. Will continue to attempt to evaluate as pt able.   West Carbo, PT, DPT   Acute Rehabilitation Department Pager #: [phone]   Sandra Cockayne 06/18/2021, 3:19 PM

## 2021-06-18 NOTE — Progress Notes (Signed)
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 and [name], paroxysmal A. fib on Eliquis, ESRD on hemodialysis TTS, chronic diastolic heart failure, diabetes type 2, hypertension, dyslipidemia who was sent from dialysis center to the ED for further evaluation of wound infection.  Patient is followed by [name] for chronic left foot wound infection with history of PAD, at follow-up on 10/3 he discussed potential need for transtibial amputation, also followed by [name] due to peripheral vascular disease and plan for angiogram.  Patient was noticed to have increasing swelling and pain around the penis and the scrotum, noted purulent discharge from the urethra and drainage of purulent secretion from  penile prothesis.  In the ED he was found to have leukocytosis white count 21,000, lactic acidosis. He was evaluated by [name] and underwent removal of 3 piece inflatable penile prosthesis on 06/14/2021.  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  -On cefepime and vancomycin -Fluconazole was discontinued -JP drain removed 10/09. Need foley catheter for another week.   2-Left foot infection, abnormal signal abnormalities of  cuboid worrisome for osteomyelitis, cellulitis and mild fasciitis -peripheral arterial disease, right BKA -Consulted with [name] and [name]. -Plan for arteriogram on 10/11 by [name] -Continue with IV antibiotics.  3-ESRD on hemodialysis: Nephrology following Had HD Saturday.   4-Paroxysmal A. fib: Continue with Coreg Continue with heparin drip.  Holding Eliquis in anticipation of arteriogram next week  5-Diabetes type 2: Continue with Lantus Sliding scale insulin  CAD status post PCI: Continue with Coreg and atorvastatin Hypertension: Continue with carvedilol Hyponatremia, hypokalemia: Correction with hemodialysis   Estimated body mass index is 32.61 kg/m as calculated from the following:   Height as of this encounter: 6\' 2"  (1.88 m).   Weight as of this encounter: 115.2 kg.   DVT prophylaxis: SCD Code Status: Full code Family Communication: care discussed with patient Disposition Plan:  Status is: Inpatient  Remains inpatient appropriate because:Inpatient level of care appropriate due to severity of illness  Dispo: The patient is from: Home              Anticipated d/c is to: SNF              Patient currently is not medically stable to d/c.   Difficult to place patient No        Consultants:  Dr [name] Nephrology Dr [name] Dr [name].   Procedures:  Removal of 3 pieces inflatable penile prothesis  that were infected  Antimicrobials:   Cefepime 10/4  Vancomycin 10/4 Fluconazole. 10/5   Subjective:  No new complaints.   Objective: Vitals:   06/18/21 0445 06/18/21 0556 06/18/21 0910 06/18/21 1110  BP: (!) 152/83  (!) 145/80 (!) 148/64  Pulse: 70  88 71  Resp: 20  20 18   Temp: (!) 97.4 F (36.3 C)  98.3 F (36.8 C) 98.3 F (36.8 C)   TempSrc: Oral  Oral Oral  SpO2: 100%  100%   Weight:  115.2 kg    Height:        Intake/Output Summary (Last 24 hours) at 06/18/2021 1426 Last data filed at 06/18/2021 0930 Gross per 24 hour  Intake 1668.29 ml  Output 655 ml  Net 1013.29 ml    Filed Weights   06/17/21 0854 06/17/21 1138 06/18/21 0556  Weight: 114.7 kg 112.2 kg 115.2 kg    Examination:  General exam: NAD Respiratory system: CTA Cardiovascular system:  S 1,  S 2 RRR Gastrointestinal system: BS present, soft Central nervous system: Alert Extremities: right BKA, left foot with edema, dressing in place.   Data Reviewed: I have personally reviewed following labs and imaging studies  CBC: Recent Labs  Lab 06/13/21 2017 06/14/21 0213 06/15/21 0120 06/16/21 0214 06/17/21 0059 06/18/21 0058  WBC 21.7* 15.2* 15.5* 16.8* 9.0 6.7  NEUTROABS 19.5*  --   --   --   --   --   HGB 12.1* 10.1* 10.1* 9.6* 9.3* 9.7*  HCT 36.8* 30.6* 29.9* 29.7* 28.0* 29.5*  MCV 91.3 93.0 92.0 92.8 91.2 90.8  PLT 358 241 219 269 248 062    Basic Metabolic Panel: Recent Labs  Lab 06/14/21 0213 06/15/21 0120 06/16/21 0214 06/17/21 0836 06/18/21 0715  NA 133* 134* 135 133* 133*  K 4.1 3.8 3.4* 3.0* 3.3*  CL 94* 96* 96* 97* 95*  CO2 27 24 24 25 25   GLUCOSE 392* 280* 193* 112* 217*  BUN 24* 40* 32* 43* 32*  CREATININE 4.28* 5.36* 4.19* 5.12* 4.71*  CALCIUM 7.8* 8.1* 7.9* 7.7* 7.8*  PHOS  --   --   --  5.8*  --     GFR: Estimated Creatinine Clearance: 20.5 mL/min (A) (by C-G formula based on SCr of 4.71 mg/dL (H)). Liver Function Tests: Recent Labs  Lab 06/13/21 2017 06/17/21 0836  AST 36  --   ALT 22  --   ALKPHOS 123  --   BILITOT 1.1  --   PROT 6.8  --   ALBUMIN 2.7* 2.0*    No results for input(s): LIPASE, AMYLASE in the last 168 hours. No results for input(s): AMMONIA in the last 168 hours. Coagulation Profile: Recent Labs  Lab 06/13/21 2017 06/14/21 0213  INR 1.5* 1.5*    Cardiac Enzymes: No  results for input(s): CKTOTAL, CKMB, CKMBINDEX, TROPONINI in the last 168 hours. BNP (last 3 results) No results for input(s): PROBNP in the last 8760 hours. HbA1C: No results for input(s): HGBA1C in the last 72 hours.  CBG: Recent Labs  Lab 06/18/21 0450 06/18/21 0532 06/18/21 0550 06/18/21 0910 06/18/21 1116  GLUCAP 65* 69* 100* 199* 223*    Lipid Profile: No results for input(s): CHOL, HDL, LDLCALC, TRIG, CHOLHDL, LDLDIRECT in the last 72 hours. Thyroid Function Tests: No results for input(s): TSH, T4TOTAL, FREET4, T3FREE, THYROIDAB in the last 72 hours. Anemia Panel: No results for input(s): VITAMINB12, FOLATE, FERRITIN, TIBC, IRON, RETICCTPCT in the last 72 hours. Sepsis Labs: Recent Labs  Lab 06/13/21 1940 06/13/21 2200 06/14/21 0213 06/14/21 0217  PROCALCITON  --   --  3.07  --  LATICACIDVEN 3.5* 3.9*  --  2.4*     Recent Results (from the past 240 hour(s))  Blood Culture (routine x 2)     Status: None   Collection Time: 06/13/21  7:40 PM   Specimen: BLOOD  Result Value Ref Range Status   Specimen Description BLOOD RIGHT ANTECUBITAL  Final   Special Requests   Final    BOTTLES DRAWN AEROBIC AND ANAEROBIC Blood Culture results may not be optimal due to an inadequate volume of blood received in culture bottles   Culture   Final    NO GROWTH 5 DAYS Performed at [facility], [place] [address] [place], [place] [address]    Report Status 06/18/2021 FINAL  Final  Resp Panel by RT-PCR (Flu A&B, Covid) Nasopharyngeal Swab     Status: None   Collection Time: 06/13/21  8:17 PM   Specimen: Nasopharyngeal Swab; Nasopharyngeal(NP) swabs in vial transport medium  Result Value Ref Range Status   SARS Coronavirus 2 by RT PCR NEGATIVE NEGATIVE Final    Comment: (NOTE) SARS-CoV-2 target nucleic acids are NOT DETECTED.  The SARS-CoV-2 RNA is generally detectable in upper respiratory specimens during the acute phase of infection. The lowest concentration of  SARS-CoV-2 viral copies this assay can detect is 138 copies/mL. A negative result does not preclude SARS-Cov-2 infection and should not be used as the sole basis for treatment or other patient management decisions. A negative result may occur with  improper specimen collection/handling, submission of specimen other than nasopharyngeal swab, presence of viral mutation(s) within the areas targeted by this assay, and inadequate number of viral copies(<138 copies/mL). A negative result must be combined with clinical observations, patient history, and epidemiological information. The expected result is Negative.  Fact Sheet for Patients:  EntrepreneurPulse.com.au  Fact Sheet for Healthcare Providers:  IncredibleEmployment.be  This test is no t yet approved or cleared by the [place] FDA and  has been authorized for detection and/or diagnosis of SARS-CoV-2 by FDA under an Emergency Use Authorization (EUA). This EUA will remain  in effect (meaning this test can be used) for the duration of the COVID-19 declaration under Section 564(b)(1) of the Act, 21 U.S.C.section 360bbb-3(b)(1), unless the authorization is terminated  or revoked sooner.       Influenza A by PCR NEGATIVE NEGATIVE Final   Influenza B by PCR NEGATIVE NEGATIVE Final    Comment: (NOTE) The Xpert Xpress SARS-CoV-2/FLU/RSV plus assay is intended as an aid in the diagnosis of influenza from Nasopharyngeal swab specimens and should not be used as a sole basis for treatment. Nasal washings and aspirates are unacceptable for Xpert Xpress SARS-CoV-2/FLU/RSV testing.  Fact Sheet for Patients: EntrepreneurPulse.com.au  Fact Sheet for Healthcare Providers: IncredibleEmployment.be  This test is not yet approved or cleared by the [place] FDA and has been authorized for detection and/or diagnosis of SARS-CoV-2 by FDA under an Emergency Use  Authorization (EUA). This EUA will remain in effect (meaning this test can be used) for the duration of the COVID-19 declaration under Section 564(b)(1) of the Act, 21 U.S.C. section 360bbb-3(b)(1), unless the authorization is terminated or revoked.  Performed at [facility], [place] [address] [place], [place] [address]   Blood Culture (routine x 2)     Status: None   Collection Time: 06/13/21  8:50 PM   Specimen: BLOOD RIGHT HAND  Result Value Ref Range Status   Specimen Description BLOOD RIGHT HAND  Final   Special Requests   Final  BOTTLES DRAWN AEROBIC AND ANAEROBIC Blood Culture adequate volume   Culture   Final    NO GROWTH 5 DAYS Performed at [facility], [place] [address] [place], [place] [address]    Report Status 06/18/2021 FINAL  Final  Urine Culture     Status: Abnormal   Collection Time: 06/14/21 12:20 PM   Specimen: In/Out Cath Urine  Result Value Ref Range Status   Specimen Description IN/OUT CATH URINE  Final   Special Requests   Final    NONE Performed at [facility], [place] [address] [place], [place] [address]    Culture >=100,000 COLONIES/mL ENTEROCOCCUS FAECALIS (A)  Final   Report Status 06/17/2021 FINAL  Final   Organism ID, Bacteria ENTEROCOCCUS FAECALIS (A)  Final      Susceptibility   Enterococcus faecalis - MIC*    AMPICILLIN <=2 SENSITIVE Sensitive     NITROFURANTOIN <=16 SENSITIVE Sensitive     VANCOMYCIN 1 SENSITIVE Sensitive     * >=100,000 COLONIES/mL ENTEROCOCCUS FAECALIS  Aerobic/Anaerobic Culture w Gram Stain (surgical/deep wound)     Status: None (Preliminary result)   Collection Time: 06/14/21  8:14 PM   Specimen: PATH GI Other; Tissue  Result Value Ref Range Status   Specimen Description WOUND PENIS  Final   Special Requests DISCHARGE  Final   Gram Stain   Final    NO ORGANISMS SEEN SQUAMOUS EPITHELIAL CELLS PRESENT ABUNDANT WBC PRESENT,BOTH PMN AND MONONUCLEAR MODERATE GRAM POSITIVE COCCI Performed at [facility], [address], [place],  [address]    Culture   Final    FEW ENTEROCOCCUS FAECALIS FEW GROUP B STREP(S.AGALACTIAE)ISOLATED TESTING AGAINST S. AGALACTIAE NOT ROUTINELY PERFORMED DUE TO PREDICTABILITY OF AMP/PEN/VAN SUSCEPTIBILITY. NO ANAEROBES ISOLATED; CULTURE IN PROGRESS FOR 5 DAYS    Report Status PENDING  Incomplete   Organism ID, Bacteria ENTEROCOCCUS FAECALIS  Final      Susceptibility   Enterococcus faecalis - MIC*    AMPICILLIN <=2 SENSITIVE Sensitive     VANCOMYCIN 1 SENSITIVE Sensitive     GENTAMICIN SYNERGY SENSITIVE Sensitive     * FEW ENTEROCOCCUS FAECALIS  Fungus Culture With Stain     Status: None (Preliminary result)   Collection Time: 06/14/21  8:14 PM  Result Value Ref Range Status   Fungus Stain Final report  Final    Comment: (NOTE) Performed At: [facility] [place], [place] [id number] Rush Farmer MD [id number]    Fungus (Mycology) Culture PENDING  Incomplete   Fungal Source WOUND  Final    Comment: PENIS DISHARGE Performed at [facility], [place] [address], [place],  [address]   Fungus Culture Result     Status: None   Collection Time: 06/14/21  8:14 PM  Result Value Ref Range Status   Result 1 Comment  Final    Comment: (NOTE) KOH/Calcofluor preparation:  no fungus observed. Performed At: [facility] [place], [place] [id number] Rush Farmer MD XT:[id number]   Surgical PCR screen     Status: Abnormal   Collection Time: 06/14/21 11:20 PM   Specimen: Nasal Mucosa; Nasal Swab  Result Value Ref Range Status   MRSA, PCR POSITIVE (A) NEGATIVE Final    Comment: RESULT CALLED TO, READ BACK BY AND VERIFIED WITH: RN CAREY D. 06/15/21@1 :09 BY TW    Staphylococcus aureus POSITIVE (A) NEGATIVE Final    Comment: (NOTE) The Xpert SA Assay (FDA approved for NASAL specimens in  patients 64 years of age and older), is one component of a comprehensive surveillance program. It is not  intended to diagnose infection nor to guide or monitor treatment. Performed at [facility], [place] [address], [place], [place] [address]           Radiology Studies: No results found.      Scheduled Meds:  atorvastatin  40 mg Oral QHS   carvedilol  37.5 mg Oral BID WC   Chlorhexidine Gluconate Cloth  6 each Topical Q0600   fentaNYL  1 patch Transdermal Q72H   insulin aspart  0-9 Units Subcutaneous Q4H   insulin glargine-yfgn  50 Units Subcutaneous QHS   mupirocin ointment  1 application Nasal BID   sodium chloride flush  3 mL Intravenous Q12H   Continuous Infusions:  ceFEPime (MAXIPIME) IV Stopped (06/17/21 1728)   heparin 1,700 Units/hr (06/18/21 0406)   metronidazole 500 mg (06/18/21 1041)   vancomycin 1,000 mg (06/17/21 1037)     LOS: 4 days    Time spent: 35 minutes.     Elmarie Shiley, MD [facility]   If 7PM-7AM, please contact night-coverage [email or web address]  06/18/2021, 2:26 PM

## 2021-06-18 NOTE — Progress Notes (Signed)
[facility] Progress Note  Subjective: seen in room, no c/o  Vitals:   06/18/21 0445 06/18/21 0556 06/18/21 0910 06/18/21 1110  BP: (!) 152/83  (!) 145/80 (!) 148/64  Pulse: 70  88 71  Resp: 20  20 18   Temp: (!) 97.4 F (36.3 C)  98.3 F (36.8 C) 98.3 F (36.8 C)  TempSrc: Oral  Oral Oral  SpO2: 100%  100%   Weight:  115.2 kg    Height:        Exam:  alert, nad   no jvd  Chest cta bilat  Cor reg no RG  Abd soft ntnd no ascites   Ext R BKA, no edema, L foot w/ erythema and 2 ulcerated areas   Alert, NF, ox3    L AVF+bruit    OP HD: [facility] [place]   4h 5min  450/800  108kg  2/2 bath  AVF  Hep none  - Mircera 179mcg IV q 2 wks (last 9/27) - Hectoral 9mcg IV q HD   Assessment/Plan:  L foot infection+ penile prosthesis infection: prosthesis removed by urology on 10/5. 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  Urine cx growing enterococcus faecalis.  Seen by Dr [name] for PAD. Planning arteriogram this week. IV abx per urology/ pmd. Improving fever and wbc.   ESRD: HD TTS. Next HD 10/11.   HTN/volume: BP's low normal. +LE > UE mild edema, and up by wts, 2.5 L off w/ HD yesterday.    Anemia: Hgb 10.1 - not due for ESA yet  Metabolic bone disease: Ca ok, Phos pending. Continue home binders  Nutrition:  Alb low, starting protein supplements.  T2DM  PAD  CAD   [name] 06/18/2021, 12:35 PM     Recent Labs  Lab 06/17/21 0059 06/17/21 0836 06/18/21 0058 06/18/21 0715  K  --  3.0*  --  3.3*  BUN  --  43*  --  32*  CREATININE  --  5.12*  --  4.71*  CALCIUM  --  7.7*  --  7.8*  PHOS  --  5.8*  --   --   HGB 9.3*  --  9.7*  --     Inpatient medications:  atorvastatin  40 mg Oral QHS   carvedilol  37.5 mg Oral BID WC   Chlorhexidine Gluconate Cloth  6 each Topical Q0600   fentaNYL  1 patch Transdermal Q72H   insulin aspart  0-9 Units Subcutaneous Q4H   insulin glargine-yfgn  50 Units Subcutaneous QHS   mupirocin ointment  1 application Nasal  BID   sodium chloride flush  3 mL Intravenous Q12H    ceFEPime (MAXIPIME) IV Stopped (06/17/21 1728)   heparin 1,700 Units/hr (06/18/21 0406)   metronidazole 500 mg (06/18/21 1041)   vancomycin 1,000 mg (06/17/21 1037)   acetaminophen **OR** acetaminophen, HYDROmorphone (DILAUDID) injection, ondansetron **OR** ondansetron (ZOFRAN) IV

## 2021-06-18 NOTE — Progress Notes (Signed)
ANTICOAGULATION CONSULT NOTE  Pharmacy Consult for heparin Indication: atrial fibrillation  Allergies  Allergen Reactions   Contrast Media [Iodinated Diagnostic Agents]     Difficulty breathing     Ivp Dye [Iodinated Diagnostic Agents] Anaphylaxis and Other (See Comments)    Breathing problems, altered mental state    Adhesive [Tape] Rash    Rash after 3-4 days use    Patient Measurements: Height: 6\' 2"  (188 cm) Weight: 112.2 kg (247 lb 5.7 oz) IBW/kg (Calculated) : 82.2 Heparin Dosing Weight: 105kg  Vital Signs: Temp: 98.1 F (36.7 C) (10/09 0037) Temp Source: Oral (10/09 0037) BP: 146/64 (10/09 0037) Pulse Rate: 78 (10/09 0037)  Labs: Recent Labs    06/16/21 0214 06/17/21 0059 06/17/21 0836 06/17/21 1442 06/18/21 0058  HGB 9.6* 9.3*  --   --  9.7*  HCT 29.7* 28.0*  --   --  29.5*  PLT 269 248  --   --  211  APTT  --  52*  --  52* 56*  HEPARINUNFRC  --  0.33  --  0.36 0.19*  CREATININE 4.19*  --  5.12*  --   --      Estimated Creatinine Clearance: 18.7 mL/min (A) (by C-G formula based on SCr of 5.12 mg/dL (H)).   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   LEFT CAROTID ENDARTERECTOMY   Chronic kidney disease    Complication of anesthesia    BP WENT UP AT [facility] "   COPD (chronic obstructive pulmonary disease) ([place])    pt denies this dx as of 06/01/20 - no inhaler    Diabetes mellitus without complication ([place])    Diverticulitis    Diverticulosis of colon (without mention of hemorrhage)    DJD (degenerative joint disease)    knees/hands/feet/back/neck   Fatty liver    Full dentures    GERD (gastroesophageal reflux disease)    H/O hiatal hernia    History of blood transfusion    with a past surical procedure per patient 06/01/20   Hyperlipidemia    Hypertension    Neuromuscular disorder ([place])    peripheral neuropathy   Non-pressure chronic ulcer of other part of left foot limited to breakdown of skin  ([place]) 11/12/2016   Osteomyelitis (HCC)    left 5th metatarsal   PAD (peripheral artery disease) ([place])    Distal aortogram June 2012. Atherectomy left popliteal artery July 2012.    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 He is s/p procedure on 105 and has L foot osteomyelitis and plans for possible procedures. He is noted with ESRD on HD. -hg= 9.6  10/9 AM update:  Heparin level low No further aPTTs needed  Goal of Therapy:  Heparin level 0.3-0.7 units/ml Monitor platelets by anticoagulation protocol: Yes   Plan:  -Inc heparin to 1700 units/hr -1000 heparin level  [name], PharmD, BCPS Clinical Pharmacist Phone: [phone]

## 2021-06-18 NOTE — Progress Notes (Signed)
VASCULAR LAB    ABIs have been performed.  See CV proc for preliminary results.   Samreen Seltzer, RVT 06/18/2021, 2:24 PM

## 2021-06-18 NOTE — Progress Notes (Signed)
OT Cancellation Note  Patient Details Name: [name] MRN: [id number] DOB: 06-07-1956   Cancelled Treatment:    Reason Eval/Treat Not Completed: Pain limiting ability to participate.  Pt adamantly refused citing pain.  Even with the option of IV pain meds, pt continued to refuse.  Explained risks of prolonged inactivity.  He verbalizes understanding, but still refuses.  [name]., OTR/L Acute Rehabilitation Services Pager [phone] Office [phone]   [name] 06/18/2021, 3:18 PM

## 2021-06-18 NOTE — Progress Notes (Signed)
ANTICOAGULATION CONSULT NOTE  Pharmacy Consult for heparin Indication: atrial fibrillation  Allergies  Allergen Reactions   Contrast Media [Iodinated Diagnostic Agents]     Difficulty breathing     Ivp Dye [Iodinated Diagnostic Agents] Anaphylaxis and Other (See Comments)    Breathing problems, altered mental state    Adhesive [Tape] Rash    Rash after 3-4 days use    Patient Measurements: Height: 6\' 2"  (188 cm) Weight: 115.2 kg (253 lb 15.5 oz) IBW/kg (Calculated) : 82.2 Heparin Dosing Weight: 105kg  Vital Signs: Temp: 98.3 F (36.8 C) (10/09 0910) Temp Source: Oral (10/09 0910) BP: 145/80 (10/09 0910) Pulse Rate: 88 (10/09 0910)  Labs: Recent Labs    06/16/21 0214 06/16/21 0214 06/17/21 0059 06/17/21 0836 06/17/21 1442 06/18/21 0058 06/18/21 0715 06/18/21 1004  HGB 9.6*  --  9.3*  --   --  9.7*  --   --   HCT 29.7*  --  28.0*  --   --  29.5*  --   --   PLT 269  --  248  --   --  211  --   --   APTT  --   --  52*  --  52* 56*  --   --   HEPARINUNFRC  --    < > 0.33  --  0.36 0.19*  --  0.39  CREATININE 4.19*  --   --  5.12*  --   --  4.71*  --    < > = values in this interval not displayed.     Estimated Creatinine Clearance: 20.5 mL/min (A) (by C-G formula based on SCr of 4.71 mg/dL (H)).   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   LEFT CAROTID ENDARTERECTOMY   Chronic kidney disease    Complication of anesthesia    BP WENT UP AT [facility] "   COPD (chronic obstructive pulmonary disease) ([place])    pt denies this dx as of 06/01/20 - no inhaler    Diabetes mellitus without complication ([place])    Diverticulitis    Diverticulosis of colon (without mention of hemorrhage)    DJD (degenerative joint disease)    knees/hands/feet/back/neck   Fatty liver    Full dentures    GERD (gastroesophageal reflux disease)    H/O hiatal hernia    History of blood transfusion    with a past surical procedure per patient  06/01/20   Hyperlipidemia    Hypertension    Neuromuscular disorder ([place])    peripheral neuropathy   Non-pressure chronic ulcer of other part of left foot limited to breakdown of skin ([place]) 11/12/2016   Osteomyelitis ([facility])    left 5th metatarsal   PAD (peripheral artery disease) ([place])    Distal aortogram June 2012. Atherectomy left popliteal artery July 2012.    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 He is s/p procedure on 105 and has L foot osteomyelitis and plans for possible procedures. He is noted with ESRD on HD. CBC stable. Heparin level therapeutic after dose increase. Patient has been a hard stick, so will defer recheck to tomorrow. No bleeding noted.  Goal of Therapy:  Heparin level 0.3-0.7 units/ml Monitor platelets by anticoagulation protocol: Yes   Plan:  -Continue heparin at 1700 units/hr -Daily heparin level and CBC  Thank you for allowing pharmacy to participate in this patient's care.  Reatha Harps, PharmD PGY1 Pharmacy Resident 06/18/2021 11:00 AM Check [email or web address] for unit specific pharmacy number

## 2021-06-19 ENCOUNTER — Encounter (INDEPENDENT_AMBULATORY_CARE_PROVIDER_SITE_OTHER): Payer: 59 | Admitting: Ophthalmology

## 2021-06-19 DIAGNOSIS — A419 Sepsis, unspecified organism: Secondary | ICD-10-CM | POA: Diagnosis not present

## 2021-06-19 DIAGNOSIS — R6520 Severe sepsis without septic shock: Secondary | ICD-10-CM | POA: Diagnosis not present

## 2021-06-19 DIAGNOSIS — E872 Acidosis, unspecified: Secondary | ICD-10-CM | POA: Diagnosis not present

## 2021-06-19 LAB — CBC
HCT: 30.1 % — ABNORMAL LOW (ref 39.0–52.0)
Hemoglobin: 9.8 g/dL — ABNORMAL LOW (ref 13.0–17.0)
MCH: 29.8 pg (ref 26.0–34.0)
MCHC: 32.6 g/dL (ref 30.0–36.0)
MCV: 91.5 fL (ref 80.0–100.0)
Platelets: 215 10*3/uL (ref 150–400)
RBC: 3.29 MIL/uL — ABNORMAL LOW (ref 4.22–5.81)
RDW: 13.2 % (ref 11.5–15.5)
WBC: 7.6 10*3/uL (ref 4.0–10.5)
nRBC: 0 % (ref 0.0–0.2)

## 2021-06-19 LAB — ECHOCARDIOGRAM COMPLETE
Height: 74 in
S' Lateral: 3.3 cm
Weight: 3957.7 oz

## 2021-06-19 LAB — HEPARIN LEVEL (UNFRACTIONATED)
Heparin Unfractionated: 0.16 IU/mL — ABNORMAL LOW (ref 0.30–0.70)
Heparin Unfractionated: 0.34 IU/mL (ref 0.30–0.70)

## 2021-06-19 LAB — GLUCOSE, CAPILLARY
Glucose-Capillary: 107 mg/dL — ABNORMAL HIGH (ref 70–99)
Glucose-Capillary: 108 mg/dL — ABNORMAL HIGH (ref 70–99)
Glucose-Capillary: 130 mg/dL — ABNORMAL HIGH (ref 70–99)
Glucose-Capillary: 132 mg/dL — ABNORMAL HIGH (ref 70–99)
Glucose-Capillary: 74 mg/dL (ref 70–99)
Glucose-Capillary: 84 mg/dL (ref 70–99)
Glucose-Capillary: 95 mg/dL (ref 70–99)

## 2021-06-19 MED ORDER — SODIUM CHLORIDE 0.9% FLUSH
3.0000 mL | Freq: Two times a day (BID) | INTRAVENOUS | Status: DC
  Administered 2021-06-22: 3 mL via INTRAVENOUS

## 2021-06-19 MED ORDER — AMLODIPINE BESYLATE 5 MG PO TABS
5.0000 mg | ORAL_TABLET | Freq: Every day | ORAL | Status: DC
  Administered 2021-06-19 – 2021-06-30 (×11): 5 mg via ORAL
  Filled 2021-06-19 (×12): qty 1

## 2021-06-19 MED ORDER — DARBEPOETIN ALFA 100 MCG/0.5ML IJ SOSY
100.0000 ug | PREFILLED_SYRINGE | INTRAMUSCULAR | Status: DC
  Administered 2021-06-20 – 2021-06-27 (×2): 100 ug via INTRAVENOUS
  Filled 2021-06-19 (×4): qty 0.5

## 2021-06-19 MED ORDER — HYDRALAZINE HCL 20 MG/ML IJ SOLN
10.0000 mg | Freq: Four times a day (QID) | INTRAMUSCULAR | Status: DC | PRN
  Administered 2021-06-19 – 2021-06-22 (×3): 10 mg via INTRAVENOUS
  Filled 2021-06-19 (×4): qty 1

## 2021-06-19 MED ORDER — SODIUM CHLORIDE 0.9 % IV SOLN
250.0000 mL | INTRAVENOUS | Status: DC | PRN
Start: 2021-06-19 — End: 2021-06-23

## 2021-06-19 MED ORDER — SODIUM CHLORIDE 0.9% FLUSH
3.0000 mL | INTRAVENOUS | Status: DC | PRN
Start: 2021-06-19 — End: 2021-06-23

## 2021-06-19 NOTE — Evaluation (Signed)
Occupational Therapy Evaluation Patient Details Name: [name] MRN: [id number] DOB: 13-Dec-1955 Today's Date: 06/19/2021   History of Present Illness Pt is 65 yo male admitted on 06/13/21 with L foot infection, infected penile prosthesis, and sepsis.  He is s/p removal of penile prosthesis on 06/14/21 with JP drain.  He is to have arteriogram of L foot on 10/11.  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 and [name], paroxysmal A. fib on Eliquis, ESRD on hemodialysis TTS, chronic diastolic heart failure, diabetes type 2, hypertension, dyslipidemia   Clinical Impression   PTA, pt lives with spouse and disabled son. Pt reports typically using power wheelchair for mobility (not bathroom accessible so used crutches for this). From limited info obtained from pt, appears he was Setup assist for majority of ADLs. Pt presents now with flat affect and self limiting for OOB attempts, able to get B feet on floor but could not be encouraged to attempt tasks further. Pt reports chronic pain, dizzziness (BP WFL) as barriers to allowing therapists to assist further. Discussed plan for DC home with pt reporting he feels he could not manage at home, so will recommend SNF rehab at this time. Will continue to follow acutely to progress ADLs/transfers within pt tolerance.      Recommendations for follow up therapy are one component of a multi-disciplinary discharge planning process, led by the attending physician.  Recommendations may be updated based on patient status, additional functional criteria and insurance authorization.   Follow Up Recommendations  SNF    Equipment Recommendations  None recommended by OT    Recommendations for Other Services       Precautions / Restrictions Precautions Precautions: Fall Precaution Comments: R BKA with prosthetic, JP drain Restrictions Weight  Bearing Restrictions: No      Mobility Bed Mobility Overal bed mobility: Needs Assistance Bed Mobility: Rolling;Sidelying to Sit;Sit to Sidelying Rolling: Supervision Sidelying to sit: Min assist     Sit to sidelying: Supervision General bed mobility comments: Attempted rolling and transitioning to L side of bed. Pt able to get in sidelying position with feet on floor with min A and increased time.  He then stayed in that postion for at least 10 mins stating that he needed time.  Offered assist , HOB raised, encouragement - pt made 2 attempts but returned to sidelying.  Reports limited due to lightheadedness, dizziness, and pain.  Then returned to bed.  Pt easily able to get legs back on bed and roll over.  Then scooted up in bed wth use of arms and min A of 2    Transfers                 General transfer comment: declined    Balance Overall balance assessment: Needs assistance     Sitting balance - Comments: unable                                   ADL either performed or assessed with clinical judgement   ADL Overall ADL's : Needs assistance/impaired Eating/Feeding: Independent;Bed level   Grooming: Set up;Bed level   Upper Body Bathing: Minimal assistance;Bed level   Lower Body Bathing: Total assistance;Bed level   Upper Body Dressing : Minimal assistance;Bed level   Lower Body Dressing: Total assistance;Bed level  Toileting- Clothing Manipulation and Hygiene: Total assistance;Bed level         General ADL Comments: Limited ADL evaluation due to pt unable to get fully EOB or attempt transfers this session due to chronic pain, dizziness, self limiting behaviors     Vision Ability to See in Adequate Light: 0 Adequate Patient Visual Report: No change from baseline Vision Assessment?: No apparent visual deficits     Perception     Praxis      Pertinent Vitals/Pain Pain Assessment: Faces Faces Pain Scale: Hurts little more Pain  Location: neck, back , legs - chronic Pain Descriptors / Indicators: Discomfort Pain Intervention(s): Monitored during session     Hand Dominance Right   Extremity/Trunk Assessment Upper Extremity Assessment Upper Extremity Assessment: Generalized weakness   Lower Extremity Assessment Lower Extremity Assessment: Defer to PT evaluation RLE Deficits / Details: Limited testing due to pt's willingness; Has R BKA with prosthetic donned; ROM appears WFL; Strength at least 3/5 LLE Deficits / Details: Limited testing due to pt's willingness.  No overt ROM deficits.  Strength appears to be at least 3/5   Cervical / Trunk Assessment Cervical / Trunk Assessment: Normal   Communication Communication Communication: No difficulties   Cognition Arousal/Alertness: Awake/alert Behavior During Therapy: Flat affect Overall Cognitive Status: No family/caregiver present to determine baseline cognitive functioning                                 General Comments: Increased time to respond.  Pt very flat requiring specific questions and still providing limited responsed.  Poor motivation for activity   General Comments  Pt had c/o dizziness and lightheadedness.  BP was stable    Exercises     Shoulder Instructions      Home Living Family/patient expects to be discharged to:: Private residence Living Arrangements: Spouse/significant other;Children Available Help at Discharge: Family Type of Home: House Home Access: Ramped entrance     Home Layout: One level     Bathroom Shower/Tub: Occupational psychologist: Handicapped height Bathroom Accessibility: No   Home Equipment: Crutches;Walker - 2 wheels;Wheelchair - power;Bedside commode   Additional Comments: Pt very flat and provided limited answers to PLOF with increased time      Prior Functioning/Environment Level of Independence: Needs assistance  Gait / Transfers Assistance Needed: Uses a w/c mostly but it  does not fit in bathroom so has to take some steps used crutches; Has R BKA prosthetic ADL's / Homemaking Assistance Needed: Does sponge baths; reports wearing clothing that is easier to manage. Would not specifically answer assist needed for ADLs. Reports does not complete IADLs anymore            OT Problem List: Decreased strength;Decreased activity tolerance;Impaired balance (sitting and/or standing);Decreased safety awareness;Pain      OT Treatment/Interventions: Self-care/ADL training;Therapeutic exercise;Energy conservation;DME and/or AE instruction;Therapeutic activities;Patient/family education;Balance training    OT Goals(Current goals can be found in the care plan section) Acute Rehab OT Goals Patient Stated Goal: feel better OT Goal Formulation: With patient Time For Goal Achievement: 07/03/21 Potential to Achieve Goals: Fair  OT Frequency: Min 2X/week   Barriers to D/C:            Co-evaluation PT/OT/SLP Co-Evaluation/Treatment: Yes Reason for Co-Treatment: Necessary to address cognition/behavior during functional activity;For patient/therapist safety PT goals addressed during session: Mobility/safety with mobility OT goals addressed during session: ADL's and self-care  AM-PAC OT "6 Clicks" Daily Activity     Outcome Measure Help from another person eating meals?: None Help from another person taking care of personal grooming?: A Little Help from another person toileting, which includes using toliet, bedpan, or urinal?: Total Help from another person bathing (including washing, rinsing, drying)?: Total Help from another person to put on and taking off regular upper body clothing?: A Little Help from another person to put on and taking off regular lower body clothing?: Total 6 Click Score: 13   End of Session Nurse Communication: Mobility status  Activity Tolerance: Patient limited by fatigue;Other (comment) (self limiting) Patient left: in bed;with bed  alarm set;with call bell/phone within reach  OT Visit Diagnosis: Unsteadiness on feet (R26.81);Other abnormalities of gait and mobility (R26.89);Muscle weakness (generalized) (M62.81)                Time: 4801-6553 OT Time Calculation (min): 26 min Charges:  OT General Charges $OT Visit: 1 Visit OT Evaluation $OT Eval Moderate Complexity: 1 Mod  Malachy Chamber, OTR/L Acute Rehab Services Office: [phone]   Layla Maw 06/19/2021, 12:40 PM

## 2021-06-19 NOTE — Plan of Care (Signed)
  Problem: Safety: Goal: Ability to remain free from injury will improve Outcome: Progressing   

## 2021-06-19 NOTE — Progress Notes (Addendum)
[facility] Progress Note  Subjective: seen in room, no acute events, no complaints. Arteriogram planned for today  Vitals:   06/19/21 0436 06/19/21 0744 06/19/21 0800 06/19/21 0803  BP: (!) 147/67 (!) 200/89 (!) 169/95 (!) 169/95  Pulse: 71 76    Resp: 17 12 17    Temp: 98.5 F (36.9 C) 98.7 F (37.1 C)    TempSrc: Oral Oral    SpO2: 98% 99%    Weight: 115.4 kg     Height:        Exam: Gen: NAD CV: s1s2, rrr Resp: cta bl Abd: soft Ext: rt bka, lt foot w/ erythema and ulcers Neuro: awake, alert HD access: LUE AVF +b/t  Recent Labs  Lab 06/17/21 0836 06/18/21 0058 06/18/21 0715 06/19/21 0043  K 3.0*  --  3.3*  --   BUN 43*  --  32*  --   CREATININE 5.12*  --  4.71*  --   CALCIUM 7.7*  --  7.8*  --   PHOS 5.8*  --   --   --   HGB  --  9.7*  --  9.8*   Inpatient medications:  amLODipine  5 mg Oral Daily   atorvastatin  40 mg Oral QHS   carvedilol  37.5 mg Oral BID WC   Chlorhexidine Gluconate Cloth  6 each Topical Q0600   fentaNYL  1 patch Transdermal Q72H   insulin aspart  0-9 Units Subcutaneous Q4H   insulin glargine-yfgn  50 Units Subcutaneous QHS   mupirocin ointment  1 application Nasal BID   sodium chloride flush  3 mL Intravenous Q12H    ceFEPime (MAXIPIME) IV Stopped (06/17/21 1728)   heparin 1,800 Units/hr (06/19/21 0414)   metronidazole 500 mg (06/18/21 2056)   vancomycin 1,000 mg (06/17/21 1037)   acetaminophen **OR** acetaminophen, hydrALAZINE, HYDROmorphone (DILAUDID) injection, ondansetron **OR** ondansetron (ZOFRAN) IV     OP HD: [facility] [place]   4h 68min  450/800  108kg  2/2 bath  AVF  Hep none  - Mircera 166mcg IV q 2 wks (last 9/27) - Hectoral 39mcg IV q HD   Assessment/Plan:  L foot infection+ penile prosthesis infection: prosthesis removed by urology on 10/5. 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  Urine cx growing enterococcus faecalis.  Seen by Dr [name] for PAD. Planning arteriogram today. IV abx per urology/ pmd.   ESRD: HD TTS. Next HD 10/11.   HTN/volume: BP's stable now (was low-normal)  Anemia: Hgb 9.8 - aranesp ordered for tomorrow  Metabolic bone disease: Ca ok, Phos pending. Continue home binders  Nutrition:  Alb low,push protein  T2DM-per primary PAD: agram today  Gean Quint, MD [place]

## 2021-06-19 NOTE — Progress Notes (Signed)
ANTICOAGULATION CONSULT NOTE Pharmacy Consult for heparin Indication: atrial fibrillation  Allergies  Allergen Reactions   Contrast Media [Iodinated Diagnostic Agents]     Difficulty breathing     Ivp Dye [Iodinated Diagnostic Agents] Anaphylaxis and Other (See Comments)    Breathing problems, altered mental state    Adhesive [Tape] Rash    Rash after 3-4 days use    Patient Measurements: Height: 6\' 2"  (188 cm) Weight: 115.4 kg (254 lb 6.6 oz) IBW/kg (Calculated) : 82.2 Heparin Dosing Weight: 105kg  Vital Signs: Temp: 98.5 F (36.9 C) (10/10 1123) Temp Source: Oral (10/10 1123) BP: 150/66 (10/10 1123) Pulse Rate: 76 (10/10 0744)  Labs: Recent Labs    06/17/21 0059 06/17/21 0836 06/17/21 1442 06/18/21 0058 06/18/21 0715 06/18/21 1004 06/19/21 0043 06/19/21 1240  HGB 9.3*  --   --  9.7*  --   --  9.8*  --   HCT 28.0*  --   --  29.5*  --   --  30.1*  --   PLT 248  --   --  211  --   --  215  --   APTT 52*  --  52* 56*  --   --   --   --   HEPARINUNFRC 0.33  --  0.36 0.19*  --  0.39 0.16* 0.34  CREATININE  --  5.12*  --   --  4.71*  --   --   --      Estimated Creatinine Clearance: 20.6 mL/min (A) (by C-G formula based on SCr of 4.71 mg/dL (H)).   Assessment: 65 y.o. male with h/o Afib, Eliqus on hold, continues on heparin Heparin level therapeutic this PM  Goal of Therapy:  Heparin level 0.3-0.7 units/ml Monitor platelets by anticoagulation protocol: Yes   Plan:  Continue heparin 1800 units/hr Daily heparin level, CBC  Thank you Anette Guarneri, PharmD

## 2021-06-19 NOTE — Progress Notes (Signed)
ANTICOAGULATION CONSULT NOTE Pharmacy Consult for heparin Indication: atrial fibrillation  Allergies  Allergen Reactions   Contrast Media [Iodinated Diagnostic Agents]     Difficulty breathing     Ivp Dye [Iodinated Diagnostic Agents] Anaphylaxis and Other (See Comments)    Breathing problems, altered mental state    Adhesive [Tape] Rash    Rash after 3-4 days use    Patient Measurements: Height: 6\' 2"  (188 cm) Weight: 115.2 kg (253 lb 15.5 oz) IBW/kg (Calculated) : 82.2 Heparin Dosing Weight: 105kg  Vital Signs: Temp: 98.2 F (36.8 C) (10/10 0014) Temp Source: Oral (10/10 0014) BP: 145/63 (10/10 0014) Pulse Rate: 75 (10/10 0014)  Labs: Recent Labs    06/17/21 0059 06/17/21 0836 06/17/21 1442 06/18/21 0058 06/18/21 0715 06/18/21 1004 06/19/21 0043  HGB 9.3*  --   --  9.7*  --   --  9.8*  HCT 28.0*  --   --  29.5*  --   --  30.1*  PLT 248  --   --  211  --   --  215  APTT 52*  --  52* 56*  --   --   --   HEPARINUNFRC 0.33  --  0.36 0.19*  --  0.39 0.16*  CREATININE  --  5.12*  --   --  4.71*  --   --      Estimated Creatinine Clearance: 20.5 mL/min (A) (by C-G formula based on SCr of 4.71 mg/dL (H)).   Assessment: 65 y.o. male with h/o Afib, Eliqus on hold, for heparin  Goal of Therapy:  Heparin level 0.3-0.7 units/ml Monitor platelets by anticoagulation protocol: Yes   Plan:  Increase Heparin 1800 units/hr Check heparin level in 8 hours.  Phillis Knack, PharmD, BCPS

## 2021-06-19 NOTE — Progress Notes (Signed)
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 and [name], paroxysmal A. fib on Eliquis, ESRD on hemodialysis TTS, chronic diastolic heart failure, diabetes type 2, hypertension, dyslipidemia who was sent from dialysis center to the ED for further evaluation of wound infection.  Patient is followed by [name] for chronic left foot wound infection with history of PAD, at follow-up on 10/3 he discussed potential need for transtibial amputation, also followed by [name] due to peripheral vascular disease and plan for angiogram.  Patient was noticed to have increasing swelling and pain around the penis and the scrotum, noted purulent discharge from the urethra and drainage of purulent secretion from  penile prothesis.  In the ED he was found to have leukocytosis white count 21,000, lactic acidosis. He was evaluated by [name] and underwent removal of 3 piece inflatable penile prosthesis on 06/14/2021.  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  -On cefepime and vancomycin -Fluconazole was discontinued -JP in place as of today. Need foley catheter for another week.   2-Left foot infection, abnormal signal abnormalities of  cuboid worrisome for osteomyelitis, cellulitis and mild fasciitis -peripheral arterial disease, right BKA -Consulted with [name] and [name]. -Plan for arteriogram on 10/11 by [name] -Continue with IV antibiotics.  3-ESRD on hemodialysis: Nephrology following Had HD Saturday.   4-Paroxysmal A. fib: Continue with Coreg Continue with heparin drip.  Holding Eliquis in anticipation of arteriogram next week  5-Diabetes type 2: Continue with Lantus Sliding scale insulin  CAD status post PCI: Continue with Coreg and atorvastatin Hypertension: Continue with carvedilol. BP elevated this am, added norvasc.  Chronic pain; on fentanyl patch.   Hyponatremia, hypokalemia: Correction with hemodialysis   Estimated body mass index is 32.66 kg/m as calculated from the following:   Height as of this encounter: 6\' 2"  (1.88 m).   Weight as of this encounter: 115.4 kg.   DVT prophylaxis: SCD Code Status: Full code Family Communication: wife updated. Per wife patient has said in the past if he required amputation he would want hospice care. They have try to get him outpatient palliative for pain management. His PCP is not willing to provide fentanyl patch. I have consulted palliative for goals of car.e   Disposition Plan:  Status is: Inpatient  Remains inpatient appropriate because:Inpatient level of care appropriate due to severity of illness  Dispo: The patient is from: Home              Anticipated d/c is to: SNF  Patient currently is not medically stable to d/c.   Difficult to place patient No        Consultants:  Dr [name] Nephrology Dr [name] Dr [name].   Procedures:  Removal of 3 pieces inflatable penile prothesis  that were infected  Antimicrobials:    Cefepime 10/4  Vancomycin 10/4 Fluconazole. 10/5   Subjective: He was diaphoretic this am and BP elevated.  When I saw him BP was better. He was feeling ok, no chest pain or dyspnea.  Denies worsening pain   Objective: Vitals:   06/19/21 0800 06/19/21 0803 06/19/21 1123 06/19/21 1600  BP: (!) 169/95 (!) 169/95 (!) 150/66 (!) 145/50  Pulse:    77  Resp: 17  16 20   Temp:   98.5 F (36.9 C) 98.6 F (37 C)  TempSrc:   Oral Oral  SpO2:   98% 98%  Weight:      Height:        Intake/Output Summary (Last 24 hours) at 06/19/2021 1739 Last data filed at 06/19/2021 0820 Gross per 24 hour  Intake 3 ml  Output 200 ml  Net -197 ml    Filed Weights   06/17/21 1138 06/18/21 0556 06/19/21 0436  Weight: 112.2 kg 115.2 kg 115.4 kg    Examination:  General exam: NAD Respiratory system: CTA Cardiovascular system:  S 1, S 2 RRR Gastrointestinal system: BS present, soft, nt Central nervous system: alert Extremities: right BKA, left foot with edema, dressing in place.   Data Reviewed: I have personally reviewed following labs and imaging studies  CBC: Recent Labs  Lab 06/13/21 2017 06/14/21 0213 06/15/21 0120 06/16/21 0214 06/17/21 0059 06/18/21 0058 06/19/21 0043  WBC 21.7*   < > 15.5* 16.8* 9.0 6.7 7.6  NEUTROABS 19.5*  --   --   --   --   --   --   HGB 12.1*   < > 10.1* 9.6* 9.3* 9.7* 9.8*  HCT 36.8*   < > 29.9* 29.7* 28.0* 29.5* 30.1*  MCV 91.3   < > 92.0 92.8 91.2 90.8 91.5  PLT 358   < > 219 269 248 211 215   < > = values in this interval not displayed.    Basic Metabolic Panel: Recent Labs  Lab 06/14/21 0213 06/15/21 0120 06/16/21 0214 06/17/21 0836 06/18/21 0715  NA 133* 134* 135 133* 133*  K 4.1 3.8 3.4* 3.0* 3.3*  CL 94* 96* 96* 97* 95*  CO2 27 24 24 25 25   GLUCOSE 392* 280* 193* 112* 217*  BUN 24* 40* 32* 43* 32*  CREATININE 4.28* 5.36* 4.19* 5.12* 4.71*  CALCIUM 7.8* 8.1* 7.9* 7.7* 7.8*  PHOS  --   --   --  5.8*  --     GFR: Estimated  Creatinine Clearance: 20.6 mL/min (A) (by C-G formula based on SCr of 4.71 mg/dL (H)). Liver Function Tests: Recent Labs  Lab 06/13/21 2017 06/17/21 0836  AST 36  --   ALT 22  --   ALKPHOS 123  --   BILITOT 1.1  --   PROT 6.8  --   ALBUMIN 2.7* 2.0*    No results for input(s): LIPASE, AMYLASE in the last 168 hours. No results for input(s): AMMONIA in the last 168 hours. Coagulation Profile: Recent Labs  Lab 06/13/21 2017 06/14/21 0213  INR 1.5* 1.5*    Cardiac Enzymes: No results for input(s): CKTOTAL, CKMB, CKMBINDEX, TROPONINI in the last 168 hours. BNP (last 3 results) No results for  input(s): PROBNP in the last 8760 hours. HbA1C: No results for input(s): HGBA1C in the last 72 hours.  CBG: Recent Labs  Lab 06/19/21 0012 06/19/21 0439 06/19/21 0735 06/19/21 1204 06/19/21 1626  GLUCAP 108* 95 74 84 130*    Lipid Profile: No results for input(s): CHOL, HDL, LDLCALC, TRIG, CHOLHDL, LDLDIRECT in the last 72 hours. Thyroid Function Tests: No results for input(s): TSH, T4TOTAL, FREET4, T3FREE, THYROIDAB in the last 72 hours. Anemia Panel: No results for input(s): VITAMINB12, FOLATE, FERRITIN, TIBC, IRON, RETICCTPCT in the last 72 hours. Sepsis Labs: Recent Labs  Lab 06/13/21 1940 06/13/21 2200 06/14/21 0213 06/14/21 0217  PROCALCITON  --   --  3.07  --   LATICACIDVEN 3.5* 3.9*  --  2.4*     Recent Results (from the past 240 hour(s))  Blood Culture (routine x 2)     Status: None   Collection Time: 06/13/21  7:40 PM   Specimen: BLOOD  Result Value Ref Range Status   Specimen Description BLOOD RIGHT ANTECUBITAL  Final   Special Requests   Final    BOTTLES DRAWN AEROBIC AND ANAEROBIC Blood Culture results may not be optimal due to an inadequate volume of blood received in culture bottles   Culture   Final    NO GROWTH 5 DAYS Performed at [facility] [address], [place], [place] [address]    Report Status 06/18/2021 FINAL  Final  Resp Panel  by RT-PCR (Flu A&B, Covid) Nasopharyngeal Swab     Status: None   Collection Time: 06/13/21  8:17 PM   Specimen: Nasopharyngeal Swab; Nasopharyngeal(NP) swabs in vial transport medium  Result Value Ref Range Status   SARS Coronavirus 2 by RT PCR NEGATIVE NEGATIVE Final    Comment: (NOTE) SARS-CoV-2 target nucleic acids are NOT DETECTED.  The SARS-CoV-2 RNA is generally detectable in upper respiratory specimens during the acute phase of infection. The lowest concentration of SARS-CoV-2 viral copies this assay can detect is 138 copies/mL. A negative result does not preclude SARS-Cov-2 infection and should not be used as the sole basis for treatment or other patient management decisions. A negative result may occur with  improper specimen collection/handling, submission of specimen other than nasopharyngeal swab, presence of viral mutation(s) within the areas targeted by this assay, and inadequate number of viral copies(<138 copies/mL). A negative result must be combined with clinical observations, patient history, and epidemiological information. The expected result is Negative.  Fact Sheet for Patients:  EntrepreneurPulse.com.au  Fact Sheet for Healthcare Providers:  IncredibleEmployment.be  This test is no t yet approved or cleared by the [place] FDA and  has been authorized for detection and/or diagnosis of SARS-CoV-2 by FDA under an Emergency Use Authorization (EUA). This EUA will remain  in effect (meaning this test can be used) for the duration of the COVID-19 declaration under Section 564(b)(1) of the Act, 21 U.S.C.section 360bbb-3(b)(1), unless the authorization is terminated  or revoked sooner.       Influenza A by PCR NEGATIVE NEGATIVE Final   Influenza B by PCR NEGATIVE NEGATIVE Final    Comment: (NOTE) The Xpert Xpress SARS-CoV-2/FLU/RSV plus assay is intended as an aid in the diagnosis of influenza from Nasopharyngeal swab  specimens and should not be used as a sole basis for treatment. Nasal washings and aspirates are unacceptable for Xpert Xpress SARS-CoV-2/FLU/RSV testing.  Fact Sheet for Patients: EntrepreneurPulse.com.au  Fact Sheet for Healthcare Providers: IncredibleEmployment.be  This test is not yet approved or cleared  by the [place] and has been authorized for detection and/or diagnosis of SARS-CoV-2 by FDA under an Emergency Use Authorization (EUA). This EUA will remain in effect (meaning this test can be used) for the duration of the COVID-19 declaration under Section 564(b)(1) of the Act, 21 U.S.C. section 360bbb-3(b)(1), unless the authorization is terminated or revoked.  Performed at [facility], [place] [address], [place], [place] [address]   Blood Culture (routine x 2)     Status: None   Collection Time: 06/13/21  8:50 PM   Specimen: BLOOD RIGHT HAND  Result Value Ref Range Status   Specimen Description BLOOD RIGHT HAND  Final   Special Requests   Final    BOTTLES DRAWN AEROBIC AND ANAEROBIC Blood Culture adequate volume   Culture   Final    NO GROWTH 5 DAYS Performed at [facility], [place] [address] [place], [place] [address]    Report Status 06/18/2021 FINAL  Final  Urine Culture     Status: Abnormal   Collection Time: 06/14/21 12:20 PM   Specimen: In/Out Cath Urine  Result Value Ref Range Status   Specimen Description IN/OUT CATH URINE  Final   Special Requests   Final    NONE Performed at [facility], [place] [address], [place], [place] [address]    Culture >=100,000 COLONIES/mL ENTEROCOCCUS FAECALIS (A)  Final   Report Status 06/17/2021 FINAL  Final   Organism ID, Bacteria ENTEROCOCCUS FAECALIS (A)  Final      Susceptibility   Enterococcus faecalis - MIC*    AMPICILLIN <=2 SENSITIVE Sensitive     NITROFURANTOIN <=16 SENSITIVE Sensitive     VANCOMYCIN 1 SENSITIVE Sensitive     * >=100,000 COLONIES/mL  ENTEROCOCCUS FAECALIS  Aerobic/Anaerobic Culture w Gram Stain (surgical/deep wound)     Status: None (Preliminary result)   Collection Time: 06/14/21  8:14 PM   Specimen: PATH GI Other; Tissue  Result Value Ref Range Status   Specimen Description WOUND PENIS  Final   Special Requests DISCHARGE  Final   Gram Stain   Final    NO ORGANISMS SEEN SQUAMOUS EPITHELIAL CELLS PRESENT ABUNDANT WBC PRESENT,BOTH PMN AND MONONUCLEAR MODERATE GRAM POSITIVE COCCI Performed at [facility], [address], [place], [place] [address]    Culture   Final    FEW ENTEROCOCCUS FAECALIS FEW GROUP B STREP(S.AGALACTIAE)ISOLATED TESTING AGAINST S. AGALACTIAE NOT ROUTINELY PERFORMED DUE TO PREDICTABILITY OF AMP/PEN/VAN SUSCEPTIBILITY. NO ANAEROBES ISOLATED; CULTURE IN PROGRESS FOR 5 DAYS    Report Status PENDING  Incomplete   Organism ID, Bacteria ENTEROCOCCUS FAECALIS  Final      Susceptibility   Enterococcus faecalis - MIC*    AMPICILLIN <=2 SENSITIVE Sensitive     VANCOMYCIN 1 SENSITIVE Sensitive     GENTAMICIN SYNERGY SENSITIVE Sensitive     * FEW ENTEROCOCCUS FAECALIS  Fungus Culture With Stain     Status: None (Preliminary result)   Collection Time: 06/14/21  8:14 PM  Result Value Ref Range Status   Fungus Stain Final report  Final    Comment: (NOTE) Performed At: [facility] [place], [place] [id number] Rush Farmer MD [id number]    Fungus (Mycology) Culture PENDING  Incomplete   Fungal Source WOUND  Final    Comment: PENIS DISHARGE Performed at [facility], [place] [address], [place],  [address]   Fungus Culture Result     Status: None   Collection Time:  06/14/21  8:14 PM  Result Value Ref Range Status   Result 1 Comment  Final    Comment: (NOTE) KOH/Calcofluor preparation:  no fungus observed. Performed At: [facility] [place], [place] [id number] Rush Farmer MD [id number]   Surgical PCR screen     Status:  Abnormal   Collection Time: 06/14/21 11:20 PM   Specimen: Nasal Mucosa; Nasal Swab  Result Value Ref Range Status   MRSA, PCR POSITIVE (A) NEGATIVE Final    Comment: RESULT CALLED TO, READ BACK BY AND VERIFIED WITH: RN CAREY D. 06/15/21@1 :09 BY TW    Staphylococcus aureus POSITIVE (A) NEGATIVE Final    Comment: (NOTE) The Xpert SA Assay (FDA approved for NASAL specimens in patients 32 years of age and older), is one component of a comprehensive surveillance program. It is not intended to diagnose infection nor to guide or monitor treatment. Performed at [facility], [place] [address], [place], [place] [address]           Radiology Studies: VAS [place] ABI WITH/WO TBI  Result Date: 06/19/2021  LOWER EXTREMITY DOPPLER STUDY Patient Name:  ANIKET PAYE  Date of Exam:   06/18/2021 Medical Rec #: [id number]      Accession #:    [id number] Date of Birth: 1956/07/17      Patient Gender: M Patient Age:   45 years Exam Location:  [facility] Procedure:      VAS [place] ABI WITH/WO TBI Referring Phys: Adrian Prows --------------------------------------------------------------------------------  Indications: Ulceration, and peripheral artery disease. High Risk Factors: Hyperlipidemia, Diabetes, past history of smoking. Other Factors: ESRD, on dialysis, atrial fibrillation.  Vascular Interventions: Right BKA, Left CEA,. Comparison Study: Prior limited ABI done 08/19/20 Performing Technologist: Sharion Dove RVS  Examination Guidelines: A complete evaluation includes at minimum, Doppler waveform signals and systolic blood pressure reading at the level of bilateral brachial, anterior tibial, and posterior tibial arteries, when vessel segments are accessible. Bilateral testing is considered an integral part of a complete examination. Photoelectric Plethysmograph (PPG) waveforms and toe systolic pressure readings are included as required and additional duplex testing as needed. Limited examinations for  reoccurring indications may be performed as noted.  ABI Findings: +---------+------------------+-----+--------+--------+ Right    Rt Pressure (mmHg)IndexWaveformComment  +---------+------------------+-----+--------+--------+ Brachial 136                    biphasic         +---------+------------------+-----+--------+--------+ PTA                                     BKA      +---------+------------------+-----+--------+--------+ DP                                      BKA      +---------+------------------+-----+--------+--------+ Great Toe                               BKA      +---------+------------------+-----+--------+--------+ +---------+------------------+-----+--------+--------------------------+ Left     Lt Pressure (mmHg)IndexWaveformComment                    +---------+------------------+-----+--------+--------------------------+ Brachial  Restricted/dialysis access +---------+------------------+-----+--------+--------------------------+ PTA      254               1.87                                    +---------+------------------+-----+--------+--------------------------+ DP       86                0.63                                    +---------+------------------+-----+--------+--------------------------+ Great Toe79                0.58                                    +---------+------------------+-----+--------+--------------------------+ +-------+---------------------+-----------+------------+------------+ ABI/TBIToday's ABI          Today's TBIPrevious ABIPrevious TBI +-------+---------------------+-----------+------------+------------+ Right  BKA                  BKA                                 +-------+---------------------+-----------+------------+------------+ Left   non compressible/0.630.58                   0.58          +-------+---------------------+-----------+------------+------------+ Left TBIs appear essentially unchanged compared to prior study on 08/19/20.  Summary: Right: BKA. Left: Resting left ankle-brachial index indicates noncompressible left lower extremity arteries. The left toe-brachial index is abnormal.  *See table(s) above for measurements and observations.  Electronically signed by Orlie Pollen on 06/19/2021 at 6:49:05 AM.    Final         Scheduled Meds:  amLODipine  5 mg Oral Daily   atorvastatin  40 mg Oral QHS   carvedilol  37.5 mg Oral BID WC   Chlorhexidine Gluconate Cloth  6 each Topical Q0600   [START ON 06/20/2021] darbepoetin (ARANESP) injection - DIALYSIS  100 mcg Intravenous Q Tue-HD   fentaNYL  1 patch Transdermal Q72H   insulin aspart  0-9 Units Subcutaneous Q4H   insulin glargine-yfgn  50 Units Subcutaneous QHS   mupirocin ointment  1 application Nasal BID   sodium chloride flush  3 mL Intravenous Q12H   sodium chloride flush  3 mL Intravenous Q12H   Continuous Infusions:  sodium chloride     ceFEPime (MAXIPIME) IV Stopped (06/17/21 1728)   heparin 1,800 Units/hr (06/19/21 1126)   metronidazole 500 mg (06/19/21 1127)   vancomycin 1,000 mg (06/17/21 1037)     LOS: 5 days    Time spent: 35 minutes.     Elmarie Shiley, MD [facility]   If 7PM-7AM, please contact night-coverage [email or web address]  06/19/2021, 5:39 PM

## 2021-06-19 NOTE — Evaluation (Signed)
Physical Therapy Evaluation Patient Details Name: [name] MRN: [id number] DOB: Jul 11, 1956 Today's Date: 06/19/2021  History of Present Illness  Pt is 65 yo male admitted on 06/13/21 with L foot infection, infected penile prosthesis, and sepsis.  He is s/p removal of penile prosthesis on 06/14/21 with JP drain.  He is to have arteriogram of L foot on 10/11.  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 and [name], paroxysmal A. fib on Eliquis, ESRD on hemodialysis TTS, chronic diastolic heart failure, diabetes type 2, hypertension, dyslipidemia   Clinical Impression  Pt admitted with above diagnosis. At baseline, pt uses a w/c but does ambulate short distances with crutches and R prosthetic.  He lives with wife and disabled son.  At evaluation, pt very flat and with low motivation for mobility due to reports of chronic pain, lightheadedness, and dizziness.  Pt providing limited answers to PLOF or symptom questions.  Attempted sitting EOB and pt getting legs on floor but then stayed in that position for 10 mins and unable to lift trunk.  Pt somewhat self limiting at evaluation.  Pt currently with functional limitations due to the deficits listed below (see PT Problem List). Pt will benefit from skilled PT to increase their independence and safety with mobility to allow discharge to the venue listed below.          Recommendations for follow up therapy are one component of a multi-disciplinary discharge planning process, led by the attending physician.  Recommendations may be updated based on patient status, additional functional criteria and insurance authorization.  Follow Up Recommendations SNF    Equipment Recommendations  Other (comment) (defer to post acute)    Recommendations for Other Services       Precautions / Restrictions Precautions Precautions: Fall       Mobility  Bed Mobility Overal bed mobility: Needs Assistance Bed Mobility: Rolling;Sidelying to Sit;Sit to Sidelying Rolling: Supervision Sidelying to sit: Min assist     Sit to sidelying: Supervision General bed mobility comments: Attempted rolling and transitioning to L side of bed. Pt able to get in sidelying position with feet on floor with min A and increased time.  He then stayed in that postion for at least 10 mins stating that he needed time.  Offered assist , HOB raised, encouragement - pt made 2 attempts but returned to sidelying.  Reports limited due to lightheadedness, dizziness, and pain.  Then returned to bed.  Pt easily able to get legs back on bed and roll over.  Then scooted up in bed wth use of arms and min A of 2    Transfers                    Ambulation/Gait                Stairs            Wheelchair Mobility    Modified Rankin (Stroke Patients Only)       Balance Overall balance assessment: Needs assistance     Sitting balance - Comments: unable                                     Pertinent Vitals/Pain Pain Assessment: Faces Faces Pain Scale: Hurts even more Pain Location:  neck, back , legs - chronic Pain Descriptors / Indicators: Discomfort Pain Intervention(s): Limited activity within patient's tolerance;Monitored during session    Home Living Family/patient expects to be discharged to:: Skilled nursing facility Living Arrangements: Spouse/significant other;Children (wife and son) Available Help at Discharge: Family Type of Home: House Home Access: Ramped entrance     Home Layout: One level Home Equipment: Crutches;Walker - 2 wheels;Wheelchair - power;Bedside commode Additional Comments: Pt very flat and provided limited answers to PLOF with increased time    Prior Function Level of Independence: Needs assistance   Gait / Transfers Assistance Needed: Uses a w/c mostly but it does not fit in bathroom  so has to take some steps used crutches; Has R BKA prosthetic  ADL's / Homemaking Assistance Needed: Does sponge baths;        Hand Dominance        Extremity/Trunk Assessment   Upper Extremity Assessment Upper Extremity Assessment: Defer to OT evaluation    Lower Extremity Assessment Lower Extremity Assessment: LLE deficits/detail;RLE deficits/detail RLE Deficits / Details: Limited testing due to pt's willingness; Has R BKA with prosthetic donned; ROM appears WFL; Strength at least 3/5 LLE Deficits / Details: Limited testing due to pt's willingness.  No overt ROM deficits.  Strength appears to be at least 3/5    Cervical / Trunk Assessment Cervical / Trunk Assessment: Normal  Communication      Cognition Arousal/Alertness: Awake/alert Behavior During Therapy: Flat affect Overall Cognitive Status: No family/caregiver present to determine baseline cognitive functioning                                 General Comments: Increased time to respond.  Pt very flat requiring specific questions and still providing limited responsed.  Poor motivation for activity      General Comments General comments (skin integrity, edema, etc.): Pt had c/o dizziness and lightheadedness.  BP was stable.  Tracking with eyes was normal without nystagmus and no increased dizziness with head turns.  He states he is dizzy all of the time lately (likely even before admission)    Exercises     Assessment/Plan    PT Assessment Patient needs continued PT services  PT Problem List Decreased strength;Decreased mobility;Decreased safety awareness;Decreased activity tolerance;Decreased balance;Decreased knowledge of use of DME;Pain       PT Treatment Interventions DME instruction;Therapeutic activities;Gait training;Therapeutic exercise;Patient/family education;Balance training;Functional mobility training;Wheelchair mobility training    PT Goals (Current goals can be found in the Care Plan  section)  Acute Rehab PT Goals Patient Stated Goal: feel better PT Goal Formulation: With patient Time For Goal Achievement: 07/03/21 Potential to Achieve Goals: Fair    Frequency Min 2X/week   Barriers to discharge        Co-evaluation PT/OT/SLP Co-Evaluation/Treatment: Yes Reason for Co-Treatment: Necessary to address cognition/behavior during functional activity (Pt with frequent refusals - so saw together) PT goals addressed during session: Mobility/safety with mobility         AM-PAC PT "6 Clicks" Mobility  Outcome Measure Help needed turning from your back to your side while in a flat bed without using bedrails?: A Little Help needed moving from lying on your back to sitting on the side of a flat bed without using bedrails?: A Little Help needed moving to and from a bed to a chair (including a wheelchair)?: Total Help needed standing up from a chair using your arms (e.g., wheelchair or bedside  chair)?: Total Help needed to walk in hospital room?: Total Help needed climbing 3-5 steps with a railing? : Total 6 Click Score: 10    End of Session Equipment Utilized During Treatment: Gait belt Activity Tolerance: Other (comment) (self limiting) Patient left: in bed;with call bell/phone within reach;with bed alarm set Nurse Communication: Mobility status PT Visit Diagnosis: Other abnormalities of gait and mobility (R26.89);Muscle weakness (generalized) (M62.81)    Time: 7579-7282 PT Time Calculation (min) (ACUTE ONLY): 33 min   Charges:   PT Evaluation $PT Eval Moderate Complexity: 1 Melina Schools, PT Acute Rehab Services Pager [phone] [facility] [phone]   Karlton Lemon 06/19/2021, 12:21 PM

## 2021-06-19 NOTE — Progress Notes (Signed)
Pt receives out-pt HD at [facility] on [facility]. Pt arrives at 12:10 for 12:25 chair time. Will follow and assist.   Melven Sartorius Renal Navigator [phone]

## 2021-06-20 ENCOUNTER — Encounter (HOSPITAL_COMMUNITY)
Admission: EM | Disposition: A | Discharge status: Home Health | Payer: Self-pay | Source: Home / Self Care | Attending: Internal Medicine

## 2021-06-20 ENCOUNTER — Ambulatory Visit (HOSPITAL_COMMUNITY): Admission: RE | Admit: 2021-06-20 | Payer: 59 | Source: Home / Self Care | Admitting: Cardiology

## 2021-06-20 ENCOUNTER — Encounter (HOSPITAL_COMMUNITY): Payer: Self-pay | Admitting: Cardiology

## 2021-06-20 DIAGNOSIS — I87332 Chronic venous hypertension (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: Secondary | ICD-10-CM | POA: Diagnosis not present

## 2021-06-20 DIAGNOSIS — E872 Acidosis, unspecified: Secondary | ICD-10-CM | POA: Diagnosis not present

## 2021-06-20 DIAGNOSIS — R6520 Severe sepsis without septic shock: Secondary | ICD-10-CM | POA: Diagnosis not present

## 2021-06-20 DIAGNOSIS — A419 Sepsis, unspecified organism: Secondary | ICD-10-CM | POA: Diagnosis not present

## 2021-06-20 DIAGNOSIS — Z7189 Other specified counseling: Secondary | ICD-10-CM | POA: Diagnosis not present

## 2021-06-20 DIAGNOSIS — Z992 Dependence on renal dialysis: Secondary | ICD-10-CM

## 2021-06-20 DIAGNOSIS — L97521 Non-pressure chronic ulcer of other part of left foot limited to breakdown of skin: Secondary | ICD-10-CM | POA: Diagnosis not present

## 2021-06-20 DIAGNOSIS — N186 End stage renal disease: Secondary | ICD-10-CM

## 2021-06-20 DIAGNOSIS — I739 Peripheral vascular disease, unspecified: Secondary | ICD-10-CM

## 2021-06-20 DIAGNOSIS — Z515 Encounter for palliative care: Secondary | ICD-10-CM

## 2021-06-20 DIAGNOSIS — E119 Type 2 diabetes mellitus without complications: Secondary | ICD-10-CM

## 2021-06-20 DIAGNOSIS — T8361XA Infection and inflammatory reaction due to implanted penile prosthesis, initial encounter: Secondary | ICD-10-CM

## 2021-06-20 DIAGNOSIS — Z794 Long term (current) use of insulin: Secondary | ICD-10-CM

## 2021-06-20 DIAGNOSIS — L97929 Non-pressure chronic ulcer of unspecified part of left lower leg with unspecified severity: Secondary | ICD-10-CM

## 2021-06-20 HISTORY — PX: LOWER EXTREMITY ANGIOGRAPHY: CATH118251

## 2021-06-20 LAB — AEROBIC/ANAEROBIC CULTURE W GRAM STAIN (SURGICAL/DEEP WOUND)

## 2021-06-20 LAB — BASIC METABOLIC PANEL
Anion gap: 12 (ref 5–15)
BUN: 36 mg/dL — ABNORMAL HIGH (ref 8–23)
CO2: 23 mmol/L (ref 22–32)
Calcium: 7.9 mg/dL — ABNORMAL LOW (ref 8.9–10.3)
Chloride: 101 mmol/L (ref 98–111)
Creatinine, Ser: 5.75 mg/dL — ABNORMAL HIGH (ref 0.61–1.24)
GFR, Estimated: 10 mL/min — ABNORMAL LOW (ref >60)
Glucose, Bld: 87 mg/dL (ref 70–99)
Potassium: 2.9 mmol/L — ABNORMAL LOW (ref 3.5–5.1)
Sodium: 136 mmol/L (ref 135–145)

## 2021-06-20 LAB — CBC
HCT: 29.6 % — ABNORMAL LOW (ref 39.0–52.0)
Hemoglobin: 10 g/dL — ABNORMAL LOW (ref 13.0–17.0)
MCH: 30.1 pg (ref 26.0–34.0)
MCHC: 33.8 g/dL (ref 30.0–36.0)
MCV: 89.2 fL (ref 80.0–100.0)
Platelets: 245 10*3/uL (ref 150–400)
RBC: 3.32 MIL/uL — ABNORMAL LOW (ref 4.22–5.81)
RDW: 13.4 % (ref 11.5–15.5)
WBC: 6.5 10*3/uL (ref 4.0–10.5)
nRBC: 0 % (ref 0.0–0.2)

## 2021-06-20 LAB — GLUCOSE, CAPILLARY
Glucose-Capillary: 60 mg/dL — ABNORMAL LOW (ref 70–99)
Glucose-Capillary: 101 mg/dL — ABNORMAL HIGH (ref 70–99)
Glucose-Capillary: 67 mg/dL — ABNORMAL LOW (ref 70–99)
Glucose-Capillary: 102 mg/dL — ABNORMAL HIGH (ref 70–99)
Glucose-Capillary: 60 mg/dL — ABNORMAL LOW (ref 70–99)
Glucose-Capillary: 130 mg/dL — ABNORMAL HIGH (ref 70–99)
Glucose-Capillary: 81 mg/dL (ref 70–99)
Glucose-Capillary: 190 mg/dL — ABNORMAL HIGH (ref 70–99)
Glucose-Capillary: 265 mg/dL — ABNORMAL HIGH (ref 70–99)
Glucose-Capillary: 316 mg/dL — ABNORMAL HIGH (ref 70–99)

## 2021-06-20 LAB — HEPARIN LEVEL (UNFRACTIONATED): Heparin Unfractionated: 0.47 IU/mL (ref 0.30–0.70)

## 2021-06-20 SURGERY — LOWER EXTREMITY ANGIOGRAPHY
Anesthesia: LOCAL

## 2021-06-20 MED ORDER — HEPARIN (PORCINE) IN NACL 1000-0.9 UT/500ML-% IV SOLN
INTRAVENOUS | Status: AC
  Filled 2021-06-20: qty 1000

## 2021-06-20 MED ORDER — HEPARIN (PORCINE) 25000 UT/250ML-% IV SOLN
2100.0000 [IU]/h | INTRAVENOUS | Status: DC
  Administered 2021-06-20 – 2021-06-21 (×2): 1800 [IU]/h via INTRAVENOUS
  Administered 2021-06-22: 1900 [IU]/h via INTRAVENOUS
  Filled 2021-06-20 (×6): qty 250

## 2021-06-20 MED ORDER — DEXTROSE 50 % IV SOLN: 12.5000 g | INTRAVENOUS | Status: AC

## 2021-06-20 MED ORDER — METHYLPREDNISOLONE SODIUM SUCC 125 MG IJ SOLR
125.0000 mg | Freq: Once | INTRAMUSCULAR | Status: AC
  Administered 2021-06-20: 125 mg via INTRAVENOUS
  Filled 2021-06-20: qty 2

## 2021-06-20 MED ORDER — LIDOCAINE HCL (PF) 1 % IJ SOLN
INTRAMUSCULAR | Status: DC | PRN
  Administered 2021-06-20: 15 mL via INTRADERMAL

## 2021-06-20 MED ORDER — IODIXANOL 320 MG/ML IV SOLN
INTRAVENOUS | Status: DC | PRN
  Administered 2021-06-20: 70 mL via INTRA_ARTERIAL

## 2021-06-20 MED ORDER — SODIUM CHLORIDE 0.9 % IV SOLN: INTRAVENOUS | Status: DC

## 2021-06-20 MED ORDER — DEXTROSE-NACL 5-0.45 % IV SOLN: Freq: Once | INTRAVENOUS | Status: AC

## 2021-06-20 MED ORDER — FENTANYL CITRATE (PF) 100 MCG/2ML IJ SOLN
INTRAMUSCULAR | Status: DC | PRN
  Administered 2021-06-20 (×2): 25 ug via INTRAVENOUS

## 2021-06-20 MED ORDER — DEXTROSE 50 % IV SOLN
INTRAVENOUS | Status: AC
  Administered 2021-06-20: 50 mL
  Filled 2021-06-20: qty 50

## 2021-06-20 MED ORDER — FAMOTIDINE IN NACL 20-0.9 MG/50ML-% IV SOLN
20.0000 mg | Freq: Once | INTRAVENOUS | Status: AC
  Administered 2021-06-20: 20 mg via INTRAVENOUS
  Filled 2021-06-20: qty 50

## 2021-06-20 MED ORDER — LABETALOL HCL 5 MG/ML IV SOLN: 10.0000 mg | INTRAVENOUS | Status: DC | PRN

## 2021-06-20 MED ORDER — FENTANYL CITRATE (PF) 100 MCG/2ML IJ SOLN
INTRAMUSCULAR | Status: AC
  Filled 2021-06-20: qty 2

## 2021-06-20 MED ORDER — ACETAMINOPHEN 325 MG PO TABS: 650.0000 mg | ORAL_TABLET | ORAL | Status: DC | PRN

## 2021-06-20 MED ORDER — SODIUM CHLORIDE 0.9% FLUSH: 3.0000 mL | INTRAVENOUS | Status: DC | PRN

## 2021-06-20 MED ORDER — DEXTROSE IN LACTATED RINGERS 5 % IV SOLN: INTRAVENOUS | Status: DC

## 2021-06-20 MED ORDER — DEXTROSE 50 % IV SOLN
INTRAVENOUS | Status: AC
  Filled 2021-06-20: qty 50

## 2021-06-20 MED ORDER — LIDOCAINE HCL (PF) 1 % IJ SOLN
INTRAMUSCULAR | Status: AC
  Filled 2021-06-20: qty 30

## 2021-06-20 MED ORDER — ONDANSETRON HCL 4 MG/2ML IJ SOLN: 4.0000 mg | Freq: Four times a day (QID) | INTRAMUSCULAR | Status: DC | PRN

## 2021-06-20 MED ORDER — DEXTROSE 50 % IV SOLN
12.5000 g | INTRAVENOUS | Status: AC
  Administered 2021-06-20 (×2): 12.5 g via INTRAVENOUS

## 2021-06-20 MED ORDER — DIPHENHYDRAMINE HCL 50 MG/ML IJ SOLN
25.0000 mg | Freq: Once | INTRAMUSCULAR | Status: AC
  Administered 2021-06-20: 25 mg via INTRAVENOUS
  Filled 2021-06-20: qty 1

## 2021-06-20 MED ORDER — MIDAZOLAM HCL 2 MG/2ML IJ SOLN
INTRAMUSCULAR | Status: AC
  Filled 2021-06-20: qty 2

## 2021-06-20 MED ORDER — SODIUM CHLORIDE 0.9% FLUSH
3.0000 mL | Freq: Two times a day (BID) | INTRAVENOUS | Status: DC
  Administered 2021-06-20 – 2021-06-23 (×5): 3 mL via INTRAVENOUS

## 2021-06-20 MED ORDER — HEPARIN (PORCINE) IN NACL 1000-0.9 UT/500ML-% IV SOLN
INTRAVENOUS | Status: DC | PRN
  Administered 2021-06-20 (×2): 500 mL

## 2021-06-20 MED ORDER — SODIUM CHLORIDE 0.9 % IV SOLN
250.0000 mL | INTRAVENOUS | Status: DC | PRN
  Administered 2021-06-23: 250 mL via INTRAVENOUS

## 2021-06-20 MED ORDER — MIDAZOLAM HCL 2 MG/2ML IJ SOLN
INTRAMUSCULAR | Status: DC | PRN
  Administered 2021-06-20: 2 mg via INTRAVENOUS

## 2021-06-20 MED ORDER — HYDRALAZINE HCL 20 MG/ML IJ SOLN: 5.0000 mg | INTRAMUSCULAR | Status: DC | PRN

## 2021-06-20 SURGICAL SUPPLY — 11 items
CATH ANGIO 5F PIGTAIL 65CM (CATHETERS) ×1 IMPLANT
CATH CROSS OVER TEMPO 5F (CATHETERS) ×1 IMPLANT
CATH STRAIGHT 5FR 65CM (CATHETERS) ×1 IMPLANT
CLOSURE MYNX CONTROL 5F (Vascular Products) ×1 IMPLANT
KIT PV (KITS) ×2 IMPLANT
SHEATH PINNACLE 5F 10CM (SHEATH) ×1 IMPLANT
SYR MEDRAD MARK 7 150ML (SYRINGE) ×2 IMPLANT
TRANSDUCER W/STOPCOCK (MISCELLANEOUS) ×2 IMPLANT
TRAY PV CATH (CUSTOM PROCEDURE TRAY) ×2 IMPLANT
WIRE HITORQ VERSACORE ST 145CM (WIRE) ×1 IMPLANT
WIRE MICRO SET SILHO 5FR 7 (SHEATH) ×1 IMPLANT

## 2021-06-20 NOTE — Progress Notes (Signed)
CBG currently 130. Will continue to monitor.

## 2021-06-20 NOTE — Progress Notes (Signed)
Hypoglycemic Event  CBG: 60  Treatment: D50 25 mL (12.5 gm)  Symptoms: Sweaty  Follow-up CBG: Time:0236 CBG Result:101   Possible Reasons for Event: Inadequate meal intake and Medication regimen:    Comments/MD notified: Repeat CBG 101 after 25 ml D50.    Delle Reining

## 2021-06-20 NOTE — Consult Note (Signed)
ORTHOPAEDIC CONSULTATION  REQUESTING PHYSICIAN: Elmarie Shiley, MD  Chief Complaint: Ischemic ulcers lateral left ankle and base of the fifth metatarsal left foot  HPI: [name] is a 65 y.o. male who presents with peripheral vascular disease who is status post a right transtibial amputation.  Patient has undergone prolonged conservative therapy and has healed the ulcer over the lateral malleolus.  Patient has developed a new ischemic ulcer over the base of the fifth metatarsal left foot.  Past Medical History:  Diagnosis Date   Carotid artery occlusion 11/10/10   LEFT CAROTID ENDARTERECTOMY   Chronic kidney disease    Complication of anesthesia    BP WENT UP AT [facility] "   COPD (chronic obstructive pulmonary disease) ([place])    pt denies this dx as of 06/01/20 - no inhaler    Diabetes mellitus without complication ([place])    Diverticulitis    Diverticulosis of colon (without mention of hemorrhage)    DJD (degenerative joint disease)    knees/hands/feet/back/neck   Fatty liver    Full dentures    GERD (gastroesophageal reflux disease)    H/O hiatal hernia    History of blood transfusion    with a past surical procedure per patient 06/01/20   Hyperlipidemia    Hypertension    Neuromuscular disorder ([place])    peripheral neuropathy   Non-pressure chronic ulcer of other part of left foot limited to breakdown of skin ([place]) 11/12/2016   Osteomyelitis ([facility])    left 5th metatarsal   PAD (peripheral artery disease) ([place])    Distal aortogram June 2012. Atherectomy left popliteal artery July 2012.    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   Unstable angina ([place]) 09/16/2018   Wears glasses    Past Surgical History:  Procedure Laterality Date   AMPUTATION  11/05/2011   Procedure: AMPUTATION RAY;  Surgeon: Wylene Simmer, MD;  Location: [place];  Service: Orthopedics;   Laterality: Right;  Amputation of Right 4&5th Toes   AMPUTATION Left 11/26/2012   Procedure: AMPUTATION RAY;  Surgeon: Wylene Simmer, MD;  Location: [place];  Service: Orthopedics;  Laterality: Left;  fourth ray amputation   AMPUTATION Right 08/27/2014   Procedure: Transmetatarsal Amputation;  Surgeon: Newt Minion, MD;  Location: [place];  Service: Orthopedics;  Laterality: Right;   AMPUTATION Right 01/14/2015   Procedure: AMPUTATION BELOW KNEE;  Surgeon: Newt Minion, MD;  Location: [place];  Service: Orthopedics;  Laterality: Right;   AMPUTATION Left 10/21/2015   Procedure: Left Foot 5th Ray Amputation;  Surgeon: Newt Minion, MD;  Location: [place];  Service: Orthopedics;  Laterality: Left;   ANTERIOR FUSION CERVICAL SPINE  02/06/06   C4-5, C5-6, C6-7; SURGEON DR. MAX COHEN   AV FISTULA PLACEMENT Left 06/02/2020   Procedure: ARTERIOVENOUS (AV) FISTULA CREATION LEFT;  Surgeon: Waynetta Sandy, MD;  Location: [place];  Service: Vascular;  Laterality: Left;   BACK SURGERY     x 3   [place] Left 07/21/2020   Procedure: LEFT UPPER ARM ATERIOVENOUS SUPERFISTULALIZATION;  Surgeon: Waynetta Sandy, MD;  Location: [place];  Service: Vascular;  Laterality: Left;   BELOW KNEE LEG AMPUTATION Right    CARDIAC CATHETERIZATION  10/31/04   2009   CAROTID ENDARTERECTOMY  11/10/10   CAROTID ENDARTERECTOMY Left 11/10/2010  Subtotal occlusion of left internal carotid artery with left hemispheric transient ischemic attacks.   CAROTID STENT     CARPAL TUNNEL RELEASE Right 10/21/2013   Procedure: RIGHT CARPAL TUNNEL RELEASE;  Surgeon: Wynonia Sours, MD;  Location: [place];  Service: Orthopedics;  Laterality: Right;   CHOLECYSTECTOMY     COLON SURGERY     COLONOSCOPY     COLOSTOMY REVERSAL  05/21/2018   ileostomy reversal   CYSTOSCOPY WITH STENT PLACEMENT Bilateral 01/13/2018   Procedure: CYSTOSCOPY WITH BILATERAL URETERAL CATHETER PLACEMENT;  Surgeon: Ardis Hughs, MD;  [facility];  Service: Urology;  Laterality: Bilateral;   ESOPHAGEAL MANOMETRY Bilateral 07/19/2014   Procedure: ESOPHAGEAL MANOMETRY (EM);  Surgeon: Jerene Bears, MD;  [facility];  Service: Gastroenterology;  Laterality: Bilateral;   EYE SURGERY Bilateral 2020   cataract   FEMORAL ARTERY STENT     x6   FINGER SURGERY     FOOT SURGERY  04/25/2016    EXCISION BASE 5TH METATARSAL AND PARTIAL CUBOID LEFT FOOT   HERNIA REPAIR     LEFT INGUINAL AND UMBILICAL REPAIRS   HERNIA REPAIR     I & D EXTREMITY Left 04/25/2016   Procedure: EXCISION BASE 5TH METATARSAL AND PARTIAL CUBOID LEFT FOOT;  Surgeon: Newt Minion, MD;  Location: [place];  Service: Orthopedics;  Laterality: Left;   ILEOSTOMY  01/13/2018   Procedure: ILEOSTOMY;  Surgeon: Clovis Riley, MD;  Location[facility];  Service: General;;   ILEOSTOMY CLOSURE N/A 05/21/2018   Procedure: ILEOSTOMY REVERSAL ERAS PATHWAY;  Surgeon: Clovis Riley, MD;  Location: [place];  Service: General;  Laterality: N/A;   IR RADIOLOGIST EVAL & MGMT  11/19/2017   IR RADIOLOGIST EVAL & MGMT  12/03/2017   IR RADIOLOGIST EVAL & MGMT  12/18/2017   JOINT REPLACEMENT Right 2001   Total knee   LAMINECTOMY     X 3 LUMBAR AND X 2 CERVICAL SPINE OPERATIONS   LAPAROSCOPIC CHOLECYSTECTOMY W/ CHOLANGIOGRAPHY  11/09/04   SURGEON DR. [place] CATH AND CORONARY ANGIOGRAPHY N/A 09/16/2018   Procedure: LEFT HEART CATH AND CORONARY ANGIOGRAPHY;  Surgeon: Nigel Mormon, MD;  Location: [place] [facility];  Service: Cardiovascular;  Laterality: N/A;   LEFT HEART CATHETERIZATION WITH CORONARY ANGIOGRAM N/A 10/29/2014   Procedure: LEFT HEART CATHETERIZATION WITH CORONARY ANGIOGRAM;  Surgeon: Laverda Page, MD;  Location: [facility] C[facility];  Service: Cardiovascular;  Laterality: N/A;   LIGATION OF COMPETING BRANCHES OF ARTERIOVENOUS FISTULA Left 07/21/2020   Procedure: LIGATION OF COMPETING BRANCHES OF LEFT UPPER ARM ARTERIOVENOUS FISTULA;   Surgeon: Waynetta Sandy, MD;  Location: [place];  Service: Vascular;  Laterality: Left;   LOWER EXTREMITY ANGIOGRAM N/A 03/19/2012   Procedure: LOWER EXTREMITY ANGIOGRAM;  Surgeon: Burnell Blanks, MD;  Location: [facility];  Service: Cardiovascular;  Laterality: N/A;   NECK SURGERY     PARTIAL COLECTOMY N/A 01/13/2018   Procedure: LAPAROSCOPIC ASSISTED   SIGMOID COLECTOMY ILEOSTOMY;  Surgeon: Clovis Riley, MD;  [facility];  Service: General;  Laterality: N/A;   PENILE PROSTHESIS IMPLANT  08/14/05   INFRAPUBIC INSERTION OF INFLATABLE PENILE PROSTHESIS; SURGEON DR. Amalia Hailey   PENILE PROSTHESIS IMPLANT     PERCUTANEOUS CORONARY STENT INTERVENTION (PCI-S) Right 10/29/2014   Procedure: PERCUTANEOUS CORONARY STENT INTERVENTION (PCI-S);  Surgeon: Laverda Page, MD;  Location: [facility] CATH LAB;  Service: Cardiovascular;  Laterality: Right;   REMOVAL  OF PENILE PROSTHESIS N/A 06/14/2021   Procedure: Removal of THREE piece inflatable penile prosthesis;  Surgeon: Lucas Mallow, MD;  Location: [place];  Service: Urology;  Laterality: N/A;   SHOULDER ARTHROSCOPY     SPINE SURGERY     TOE AMPUTATION Left    TONSILLECTOMY     TOTAL KNEE ARTHROPLASTY  07/2002   RIGHT KNEE ; SURGEON  DR. [name] ALSO HAD ARTHROSCOPIC RIGHT KNEE IN  10/2001   TOTAL KNEE ARTHROPLASTY     ULNAR NERVE TRANSPOSITION Right 10/21/2013   Procedure: RIGHT ELBOW  ULNAR NERVE DECOMPRESSION;  Surgeon: Wynonia Sours, MD;  Location: [place];  Service: Orthopedics;  Laterality: Right;   Social History   Socioeconomic History   Marital status: Married    Spouse name: Not on file   Number of children: 3   Years of education: Not on file   Highest education level: Not on file  Occupational History   Occupation: [employer]    Employer: UNEMPLOYED  Tobacco Use   Smoking status: Former    Packs/day: 2.00    Years: 35.00    Pack years: 70.00    Types: Cigarettes    Quit date: 10/28/2011     Years since quitting: 9.6   Smokeless tobacco: Never  Vaping Use   Vaping Use: Some days   Substances: Nicotine  Substance and Sexual Activity   Alcohol use: Not Currently    Comment: "not in a long time"   Drug use: Never   Sexual activity: Yes    Birth control/protection: Implant    Comment: penile implant  Other Topics Concern   Not on file  Social History Narrative   ** Merged History Encounter **       HEAVY SMOKER AND CONTINUES TO SMOKE 1 PPD. DOES NOT EXERCISE REGULARLY.  Wife [phone] Lebron Quam). Has 2 sons and daughter. Still active       Social Determinants of Health   Financial Resource Strain: Not on file  Food Insecurity: Not on file  Transportation Needs: Not on file  Physical Activity: Not on file  Stress: Not on file  Social Connections: Not on file   Family History  Problem Relation Age of Onset   Heart disease Father        Before age 64-  CAD, BPG   Diabetes Father        Amputation   Cancer Father        PROSTATE   Hyperlipidemia Father    Hypertension Father    Heart attack Father        Triple BPG   Varicose Veins Father    Colon cancer Brother    Diabetes Brother    Heart disease Brother 14       A-Fib. Before age 61   Hyperlipidemia Brother    Hypertension Brother    Cancer Sister        Breast   Hyperlipidemia Sister    Hypertension Sister    Hypertension Son    Arthritis Other        GRANDMOTHER   Hypertension Other        OTHER FAMILY MEMBERS   - negative except otherwise stated in the family history section Allergies  Allergen Reactions   Contrast Media [Iodinated Diagnostic Agents]     Difficulty breathing     Ivp Dye [Iodinated Diagnostic Agents] Anaphylaxis and Other (See Comments)    Breathing problems, altered mental state  Adhesive [Tape] Rash    Rash after 3-4 days use   Prior to Admission medications   Medication Sig Start Date End Date Taking? Authorizing Provider  amLODipine (NORVASC) 5 MG  tablet Take 5 mg by mouth daily. 01/30/21  Yes [provider]  apixaban (ELIQUIS) 5 MG TABS tablet Take 5 mg by mouth See admin instructions. Only take on non-dialysis days Monday,Wednesday,Friday,sunday   Yes [provider]  atorvastatin (LIPITOR) 40 MG tablet Take 40 mg by mouth at bedtime.    Yes [provider]  BIDIL 20-37.5 MG tablet Take 1 tablet by mouth See admin instructions. Take on Monday,Wednesday,Friday,sunday 04/28/20  Yes [provider]  carvedilol (COREG) 25 MG tablet Take 1.5 tablets (37.5 mg total) by mouth 2 (two) times daily. Patient taking differently: Take 12.5 mg by mouth 2 (two) times daily. 08/01/16  Yes Adrian Prows, MD  famotidine (PEPCID) 40 MG tablet Take 40 mg by mouth daily. 04/29/21  Yes [provider]  fentaNYL (DURAGESIC - DOSED MCG/HR) 50 MCG/HR Place 50 mcg onto the skin every 3 (three) days.   Yes [provider]  folic acid-vitamin b complex-vitamin c-selenium-zinc (DIALYVITE) 3 MG TABS tablet Take 1 tablet by mouth daily.   Yes [provider]  Glucosamine HCl (GLUCOSAMINE PO) Take 100 tablets by mouth 2 (two) times daily.   Yes [provider]  HUMALOG KWIKPEN 100 UNIT/ML KiwkPen Inject 13 Units into the skin 3 (three) times daily. 11/14/17  Yes [provider]  insulin glargine (LANTUS) 100 UNIT/ML injection Inject 55 Units into the skin at bedtime.   Yes [provider]  ketoconazole (NIZORAL) 2 % cream Apply 1 application topically 2 (two) times daily as needed for irritation.  03/07/18  Yes [provider]  lidocaine-prilocaine (EMLA) cream Dialysis days Tuesday,Thursday and saturday 06/06/21  Yes [provider]  linaclotide (LINZESS) 145 MCG CAPS capsule Take 145 mcg by mouth daily as needed (constipation).    Yes [provider]  loratadine (CLARITIN) 10 MG tablet Take 10 mg by mouth daily.   Yes [provider]  NEEDLE, REUSABLE, 22  G 22G X 1-1/2" MISC 1 Units by Does not apply route as directed. For B12 IM inj 02/15/18  Yes Elgergawy, Silver Huguenin, MD  omega-3 acid ethyl esters (LOVAZA) 1 g capsule Take 2 g by mouth 2 (two) times daily.   Yes [provider]  ondansetron (ZOFRAN) 4 MG tablet Take 4 mg by mouth every 6 (six) hours as needed for nausea or vomiting.   Yes [provider]  oxyCODONE-acetaminophen (PERCOCET) 7.5-325 MG tablet Take 1 tablet by mouth 3 (three) times daily as needed for moderate pain. 12/07/20  Yes [provider]  pantoprazole (PROTONIX) 40 MG tablet Take 40 mg by mouth daily.   Yes [provider]  Polyvinyl Alcohol-Povidone PF 1.4-0.6 % SOLN Place 1 drop into both eyes 2 (two) times daily as needed (dry eyes).    Yes [provider]  pregabalin (LYRICA) 100 MG capsule Take 1 capsule (100 mg total) by mouth 3 (three) times daily. 05/10/16  Yes Tat, Shanon Brow, MD  rOPINIRole (REQUIP) 2 MG tablet Take 2 mg by mouth at bedtime.   Yes [provider]  sulfamethoxazole-trimethoprim (BACTRIM DS) 800-160 MG tablet Take 1 tablet by mouth 2 (two) times daily. Patient taking differently: Take 1 tablet by mouth See admin instructions. Bid x 10 days 06/12/21  Yes Newt Minion, MD  Syringe/Needle, Disp, (SYRINGE 3CC/22GX1-1/2")  22G X 1-1/2" 3 ML MISC 1 Syringe by Does not apply route as directed. For b12 IM inj 02/15/18  Yes Elgergawy, Silver Huguenin, MD  tamsulosin (FLOMAX) 0.4 MG CAPS capsule Take 0.4 mg by mouth daily.   Yes [provider]  torsemide (DEMADEX) 20 MG tablet Take 20 mg by mouth in the morning and at bedtime. Take on extra for swelling   Yes [provider]  nitroGLYCERIN (NITRODUR - DOSED IN MG/24 HR) 0.2 mg/hr patch Place 1 patch (0.2 mg total) onto the skin daily. 06/12/21   Newt Minion, MD   VAS [place] ABI WITH/WO TBI  Result Date: 06/19/2021  LOWER EXTREMITY DOPPLER STUDY Patient Name:  [name]  Date of Exam:   06/18/2021 Medical Rec  #: [id number]      Accession #:    [id number] Date of Birth: Aug 27, 1956      Patient Gender: M Patient Age:   62 years Exam Location:  [facility] Procedure:      VAS [place] ABI WITH/WO TBI Referring Phys: Adrian Prows --------------------------------------------------------------------------------  Indications: Ulceration, and peripheral artery disease. High Risk Factors: Hyperlipidemia, Diabetes, past history of smoking. Other Factors: ESRD, on dialysis, atrial fibrillation.  Vascular Interventions: Right BKA, Left CEA,. Comparison Study: Prior limited ABI done 08/19/20 Performing Technologist: Sharion Dove RVS  Examination Guidelines: A complete evaluation includes at minimum, Doppler waveform signals and systolic blood pressure reading at the level of bilateral brachial, anterior tibial, and posterior tibial arteries, when vessel segments are accessible. Bilateral testing is considered an integral part of a complete examination. Photoelectric Plethysmograph (PPG) waveforms and toe systolic pressure readings are included as required and additional duplex testing as needed. Limited examinations for reoccurring indications may be performed as noted.  ABI Findings: +---------+------------------+-----+--------+--------+ Right    Rt Pressure (mmHg)IndexWaveformComment  +---------+------------------+-----+--------+--------+ Brachial 136                    biphasic         +---------+------------------+-----+--------+--------+ PTA                                     BKA      +---------+------------------+-----+--------+--------+ DP                                      BKA      +---------+------------------+-----+--------+--------+ Great Toe                               BKA      +---------+------------------+-----+--------+--------+ +---------+------------------+-----+--------+--------------------------+ Left     Lt Pressure (mmHg)IndexWaveformComment                     +---------+------------------+-----+--------+--------------------------+ Brachial                                Restricted/dialysis access +---------+------------------+-----+--------+--------------------------+ PTA      254               1.87                                    +---------+------------------+-----+--------+--------------------------+ DP  86                0.63                                    +---------+------------------+-----+--------+--------------------------+ Great Toe79                0.58                                    +---------+------------------+-----+--------+--------------------------+ +-------+---------------------+-----------+------------+------------+ ABI/TBIToday's ABI          Today's TBIPrevious ABIPrevious TBI +-------+---------------------+-----------+------------+------------+ Right  BKA                  BKA                                 +-------+---------------------+-----------+------------+------------+ Left   non compressible/0.630.58                   0.58         +-------+---------------------+-----------+------------+------------+ Left TBIs appear essentially unchanged compared to prior study on 08/19/20.  Summary: Right: BKA. Left: Resting left ankle-brachial index indicates noncompressible left lower extremity arteries. The left toe-brachial index is abnormal.  *See table(s) above for measurements and observations.  Electronically signed by Orlie Pollen on 06/19/2021 at 6:49:05 AM.    Final    - pertinent xrays, CT, MRI studies were reviewed and independently interpreted  Positive ROS: All other systems have been reviewed and were otherwise negative with the exception of those mentioned in the HPI and as above.  Physical Exam: General: Alert, no acute distress Psychiatric: Patient is competent for consent with normal mood and affect Lymphatic: No axillary or cervical lymphadenopathy Cardiovascular: No  pedal edema Respiratory: No cyanosis, no use of accessory musculature GI: No organomegaly, abdomen is soft and non-tender    Images:  @ENCIMAGES @  Labs:  Lab Results  Component Value Date   HGBA1C 7.9 (H) 06/14/2021   HGBA1C 5.7 (H) 09/16/2018   HGBA1C 7.7 (H) 05/15/2018   ESRSEDRATE 35 (H) 09/03/2016   ESRSEDRATE 111 (H) 05/11/2016   ESRSEDRATE 98 (H) 05/04/2016   CRP 1.2 (H) 09/03/2016   CRP 4.7 (H) 05/11/2016   CRP 8.0 (H) 05/04/2016   REPTSTATUS PENDING 06/14/2021   GRAMSTAIN  06/14/2021    NO ORGANISMS SEEN SQUAMOUS EPITHELIAL CELLS PRESENT ABUNDANT WBC PRESENT,BOTH PMN AND MONONUCLEAR MODERATE GRAM POSITIVE COCCI Performed at [facility], [address] [place], [place] [address]    CULT  06/14/2021    FEW ENTEROCOCCUS FAECALIS FEW GROUP B STREP(S.AGALACTIAE)ISOLATED TESTING AGAINST S. AGALACTIAE NOT ROUTINELY PERFORMED DUE TO PREDICTABILITY OF AMP/PEN/VAN SUSCEPTIBILITY. NO ANAEROBES ISOLATED; CULTURE IN PROGRESS FOR 5 DAYS    LABORGA ENTEROCOCCUS FAECALIS 06/14/2021    Lab Results  Component Value Date   ALBUMIN 2.0 (L) 06/17/2021   ALBUMIN 2.7 (L) 06/13/2021   ALBUMIN 3.7 (L) 05/30/2020   PREALBUMIN 19.0 05/04/2016   PREALBUMIN 25.0 12/16/2015     CBC EXTENDED Latest Ref Rng & Units 06/20/2021 06/19/2021 06/18/2021  WBC 4.0 - 10.5 K/uL 6.5 7.6 6.7  RBC 4.22 - 5.81 MIL/uL 3.32(L) 3.29(L) 3.25(L)  HGB 13.0 - 17.0 g/dL 10.0(L) 9.8(L) 9.7(L)  HCT 39.0 - 52.0 % 29.6(L) 30.1(L) 29.5(L)  PLT 150 - 400  K/uL 245 215 211  NEUTROABS 1.7 - 7.7 K/uL - - -  LYMPHSABS 0.7 - 4.0 K/uL - - -    Neurologic: Patient does not have protective sensation bilateral lower extremities.   MUSCULOSKELETAL:   Skin: Examination there is ischemic ulceration over the base of the fifth metatarsal this is approximately 2 x 3 cm with fibrinous tissue.  The ulcer over the lateral malleolus left ankle is starting to breakdown this has been completely healed.  Patient's  white cell count is 6.5 hemoglobin 10.0 with an albumin of 2.0.  Most recent hemoglobin A1c 7.9.  Assessment: Assessment: Peripheral vascular disease with acute ischemic ulceration base of the fifth metatarsal left foot with a stable right transtibial amputation.  Plan: Patient to undergo arteriogram today for the left lower extremity I will reevaluate on Thursday to evaluate progress to the ischemic ulcer base of the fifth metatarsal.  Thank you for the consult and the opportunity to see Mr. Joycie Peek, MD [place] [phone] 10:10 AM

## 2021-06-20 NOTE — TOC Initial Note (Signed)
Transition of Care [facility]) - Initial/Assessment Note    Patient Details  Name: [name] MRN: [id number] Date of Birth: 1956/06/12  Transition of Care [facility]) CM/SW Contact:    Harold Sill, LCSW Phone Number: 06/20/2021, 2:42 PM  Clinical Narrative:                  CSW met with patient at bedside. CSW introduced self and explained role. CSW discussed PT/OT recommendation for  short term rehab at [facility]. Patient states he is agreeable to short term rehab but requested CSW to contact his wife.  CSW called, [name], informed of recommendations. She advised her preference is for the patient to return home. However, she expressed patient has surgery today and wants to wait to see how he progress and talk with his doctors and palliative care before making decision. She states she wants him "as comfortable as he can be". She requested palliative care - CSW notified RN of her request-CSW was informed by RN palliative did visit but patient was lethargic,so they plan to come back tomorrow. [name] was agreeable to sending out referrals to SNF as back up plan. She states her first choice is [facility] and [facility]. She confirmed her [place] insurance is primary. CSW explained [facility] has limited SNF option and patient needing dialysis also narrows the options. She states understanding. Patient states he has received covid vaccines and booster shots.   T/T/S  [facility] 12:10 for 12:25 chair time- T/TH- transported by [facility], [place] transport   [place] will continue to follow and assist with discharge planning.  [name], MSW, LCSW Clinical Social Worker    Expected Discharge Plan: Skilled Nursing Facility Barriers to Discharge: Continued Medical Work up, Ship broker, SNF Pending bed offer   Patient Goals and CMS Choice        Expected Discharge Plan and Services Expected Discharge Plan: [place] In-house Referral:  Clinical Social Work     Living arrangements for the past 2 months: Single Family Home                                      Prior Living Arrangements/Services Living arrangements for the past 2 months: Single Family Home Lives with:: Self, Spouse Patient language and need for interpreter reviewed:: No        Need for Family Participation in Patient Care: Yes (Comment)     Criminal Activity/Legal Involvement Pertinent to Current Situation/Hospitalization: No - Comment as needed  Activities of Daily Living Home Assistive Devices/Equipment: Prosthesis ADL Screening (condition at time of admission) Patient's cognitive ability adequate to safely complete daily activities?: Yes Is the patient deaf or have difficulty hearing?: No Does the patient have difficulty seeing, even when wearing glasses/contacts?: No Does the patient have difficulty concentrating, remembering, or making decisions?: No Patient able to express need for assistance with ADLs?: Yes Does the patient have difficulty dressing or bathing?: No Independently performs ADLs?: Yes (appropriate for developmental age) Does the patient have difficulty walking or climbing stairs?: Yes Weakness of Legs: Both Weakness of Arms/Hands: None  Permission Sought/Granted Permission sought to share information with : Family Supports Permission granted to share information with : Yes, Verbal Permission Granted  Share Information with NAME: [name]  Permission granted to share info w AGENCY: SNFs  Permission granted to share info w Relationship: spouse  Permission granted to  share info w Contact Information: [phone]  Emotional Assessment Appearance:: Appears stated age   Affect (typically observed): Flat, Pleasant, Accepting Orientation: : Oriented to Self, Oriented to Place, Oriented to  Time, Oriented to Situation Alcohol / Substance Use: Not Applicable Psych Involvement: No (comment)  Admission diagnosis:   Sepsis, due to unspecified organism, unspecified whether acute organ dysfunction present [facility]) [A41.9] Severe sepsis with lactic acidosis ([place]) [A41.9, R65.20, E87.20] Patient Active Problem List   Diagnosis Date Noted   Severe sepsis with lactic acidosis ([place]) 06/14/2021   Infection associated with implanted penile prosthesis ([place]) 06/14/2021   ESRD on hemodialysis ([place]) 06/14/2021   Paroxysmal atrial fibrillation with RVR ([place]) 06/14/2021   Carotid stenosis 06/15/2020   Dyslipidemia 06/15/2020   Severe nonproliferative diabetic retinopathy of right eye, with macular edema, associated with type 2 diabetes mellitus ([place]) 01/18/2020   Severe nonproliferative diabetic retinopathy of left eye, with macular edema, associated with type 2 diabetes mellitus ([place]) 01/04/2020   Retinal hemorrhage of right eye 01/04/2020   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   Pseudoclaudication 11/15/2018   Essential hypertension 09/18/2018   S/P BKA (below knee amputation), right ([place]) 09/18/2018   Amputation of toe of left foot ([place]) 09/18/2018   Peripheral artery disease ([place]) 09/18/2018   S/P carotid endarterectomy 09/18/2018   OSA on CPAP 09/18/2018   Chronic back pain 09/18/2018   Status post reversal of ileostomy 05/21/2018   Normocytic anemia 02/14/2018   Intra-abdominal abscess ([place]) 11/04/2017   Chronic venous hypertension (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 ([place]) 12/11/2016   Ischemic ulcer of left foot, limited to breakdown of skin ([place]) 11/12/2016   Unilateral primary osteoarthritis, left knee 10/11/2016   History of right below knee amputation ([place]) 07/12/2016   Diabetic foot infection ([place]) 05/04/2016   Insulin dependent type 2 diabetes mellitus ([place]) 12/16/2015   Amputated toe ([place]) 10/21/2015   Leg edema, left 09/03/2015   CAD (dz of distal, mid and proximal RCA with implantation of 3 overlapping drug-eluting stent,) 09/03/2015   Chronic diastolic heart failure, NYHA  class 2 ([place]) 09/03/2015   Angina pectoris associated with type 2 diabetes mellitus ([place]) 10/28/2014   Hyperlipidemia 08/25/2014   Limb pain 03/20/2013   DJD (degenerative joint disease) 09/25/2012   Migraine 09/25/2012   Neuropathy 09/25/2012   Restless legs syndrome (RLS) 09/25/2012   Chronic obstructive pulmonary disease, unspecified ([place]) 04/25/2012   Unknown cause of morbidity or mortality 04/25/2012   Chronic total occlusion of artery of the extremities ([place]) 04/08/2012   Onychomycosis 02/01/2012   Occlusion and stenosis of carotid artery without mention of cerebral infarction 06/12/2011   GERD (gastroesophageal reflux disease) 05/08/2011   Barrett's esophagus without dysplasia 05/08/2011   Former tobacco use 02/01/2011   PCP:  [name], PA-C Pharmacy:   [facility] [phone] - [place], [place] - [phone] [address] AT [address] [address] Phone: [phone] Fax: [phone]     Social Determinants of Health (SDOH) Interventions    Readmission Risk Interventions No flowsheet data found.

## 2021-06-20 NOTE — Interval H&P Note (Signed)
History and Physical Interval Note:  06/20/2021 9:35 AM  [name]  has presented today for surgery, with the diagnosis of non healing wound.  The various methods of treatment have been discussed with the patient and family. After consideration of risks, benefits and other options for treatment, the patient has consented to  Procedure(s): LOWER EXTREMITY ANGIOGRAPHY (N/A) as a surgical intervention.  The patient's history has been reviewed, patient examined, no change in status, stable for surgery.  I have reviewed the patient's chart and labs.  Questions were answered to the patient's satisfaction.     [place]

## 2021-06-20 NOTE — Progress Notes (Signed)
[facility] Progress Note  Subjective: seen in room, issues with hypoglycemia overnight. Feels better. No complaints currently  Vitals:   06/20/21 0412 06/20/21 0500 06/20/21 0718 06/20/21 0800  BP: (!) 171/88 (!) 165/77 (!) 163/89   Pulse: 68 72 77   Resp: 16 12 18    Temp: (!) 97.5 F (36.4 C)  (!) 97.5 F (36.4 C)   TempSrc: Oral  Oral   SpO2: 99% 99%  100%  Weight: 116.5 kg     Height:        Exam: Gen: NAD CV: s1s2, rrr Resp: cta bl Abd: soft Ext: rt bka, lt foot w/ erythema and ulcers Neuro: awake, alert HD access: LUE AVF +b/t  Recent Labs  Lab 06/17/21 0836 06/18/21 0058 06/18/21 0715 06/19/21 0043 06/20/21 0300  K 3.0*  --  3.3*  --  2.9*  BUN 43*  --  32*  --  36*  CREATININE 5.12*  --  4.71*  --  5.75*  CALCIUM 7.7*  --  7.8*  --  7.9*  PHOS 5.8*  --   --   --   --   HGB  --    < >  --  9.8* 10.0*   < > = values in this interval not displayed.   Inpatient medications:  amLODipine  5 mg Oral Daily   atorvastatin  40 mg Oral QHS   carvedilol  37.5 mg Oral BID WC   Chlorhexidine Gluconate Cloth  6 each Topical Q0600   darbepoetin (ARANESP) injection - DIALYSIS  100 mcg Intravenous Q Tue-HD   fentaNYL  1 patch Transdermal Q72H   insulin aspart  0-9 Units Subcutaneous Q4H   sodium chloride flush  3 mL Intravenous Q12H   sodium chloride flush  3 mL Intravenous Q12H    sodium chloride     ceFEPime (MAXIPIME) IV Stopped (06/17/21 1728)   dextrose 5% lactated ringers Stopped (06/20/21 0525)   famotidine     heparin 1,800 Units/hr (06/20/21 0019)   metronidazole 500 mg (06/19/21 2121)   vancomycin 1,000 mg (06/17/21 1037)   sodium chloride, acetaminophen **OR** acetaminophen, hydrALAZINE, HYDROmorphone (DILAUDID) injection, ondansetron **OR** ondansetron (ZOFRAN) IV, sodium chloride flush     [facility] HD: [facility]   4h 34min  450/800  108kg  2/2 bath  AVF  Hep none  - Mircera 160mcg IV q 2 wks (last 9/27) - Hectoral 22mcg IV q HD    Assessment/Plan:  L foot infection+ penile prosthesis infection: prosthesis removed by urology on 10/5. 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  Urine cx growing enterococcus faecalis.  Seen by Dr [name] for PAD. Arteriogram pending. IV abx per urology/ pmd.  ESRD: HD TTS. Next HD today  HTN/volume: BP's stable now (was low-normal)  Anemia: Hgb 9.8 - aranesp ordered for tomorrow  Metabolic bone disease: Ca ok, Phos pending. Continue home binders  Nutrition:  Alb low,push protein  T2DM-per primary PAD: agram pending  Gean Quint, MD [place]

## 2021-06-20 NOTE — Progress Notes (Signed)
Hypoglycemic Event  CBG: 67   Treatment: D50 25 mL (12.5 gm)  Symptoms: None  Follow-up CBG: Time: 0530   CBG Result:102   Possible Reasons for Event: Inadequate meal intake  Comments/MD notified: 25 ml given D50.     Delle Reining

## 2021-06-20 NOTE — Progress Notes (Signed)
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 and [name], paroxysmal A. fib on Eliquis, ESRD on hemodialysis TTS, chronic diastolic heart failure, diabetes type 2, hypertension, dyslipidemia who was sent from dialysis center to the ED for further evaluation of wound infection.  Patient is followed by [name] for chronic left foot wound infection with history of PAD, at follow-up on 10/3 he discussed potential need for transtibial amputation, also followed by [name] due to peripheral vascular disease and plan for angiogram.  Patient was noticed to have increasing swelling and pain around the penis and the scrotum, noted purulent discharge from the urethra and drainage of purulent secretion from  penile prothesis.  In the ED he was found to have leukocytosis white count 21,000, lactic acidosis. He was evaluated by [name] and underwent removal of 3 piece inflatable penile prosthesis on 06/14/2021.  For his PVD he will have arteriogram today. Dr [name] consulted  for LE wound.   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  -On cefepime and vancomycin -Fluconazole was discontinued -JP will be remove today. Need foley catheter for another week.  Urology recommend 1 week of antibiotics.   2-Left foot infection, abnormal signal abnormalities of  cuboid worrisome for osteomyelitis, cellulitis and mild fasciitis -peripheral arterial disease, right BKA -Consulted with [name] and [name]. -Underwent  arteriogram on 10/11: No significant iliofemoral disease.  Patent left SFA stents.  2/3 vessel below the knee runoff to the foot.  Proximal occluded left AT.  Awaiting final cardiology recommendation. -Continue with IV antibiotics.  3-ESRD on hemodialysis: Nephrology following   4-Paroxysmal A. fib: Continue with Coreg Continue with heparin drip.  Holding Eliquis in anticipation of arteriogram next week  5-Diabetes type 2:  Sliding scale insulin Hypoglycemia.  Withhold long-acting insulin.  He report poor oral intake.  CAD status post PCI: Continue with Coreg and atorvastatin Hypertension: Continue with carvedilol, Norvasc. Chronic pain; on fentanyl patch.   Hyponatremia, hypokalemia: Correction with hemodialysis   Estimated body mass index is 32.98 kg/m as calculated from the following:   Height as of this encounter: 6\' 2"  (1.88 m).   Weight as of this encounter: 116.5 kg.   DVT prophylaxis: SCD Code Status: Full code Family Communication: wife updated 10/10. Per wife patient has said in the past if he required amputation he would want hospice care. They have try to get him outpatient palliative for pain management. His PCP is not willing to provide fentanyl patch. I have consulted palliative for goals of car.e   Disposition Plan:  Status is: Inpatient  Remains inpatient appropriate because:Inpatient level of care appropriate due to severity of illness  Dispo: The patient is from: Home               Anticipated d/c is to: SNF              Patient currently is not medically stable to d/c.   Difficult to place patient No        Consultants:  Dr [name] Nephrology Dr [name] Dr [name].   Procedures:  Removal of 3 pieces inflatable penile prothesis  that were infected  Antimicrobials:   Cefepime 10/4  Vancomycin 10/4 Fluconazole. 10/5   Subjective: Patient denies chest pain or shortness of breath.  He keeps his eyes closed.  He is awaiting arteriogram today.  Objective: Vitals:   06/20/21 0412 06/20/21 0500 06/20/21 0718 06/20/21 0800  BP: (!) 171/88 (!) 165/77 (!) 163/89   Pulse: 68 72 77   Resp: 16 12 18    Temp: (!) 97.5 F (36.4 C)  (!) 97.5 F (36.4 C)   TempSrc: Oral  Oral   SpO2: 99% 99%  100%  Weight: 116.5 kg     Height:        Intake/Output Summary (Last 24 hours) at 06/20/2021 0839 Last data filed at 06/20/2021 0543 Gross per 24 hour  Intake 1590.17 ml  Output 1190 ml  Net 400.17 ml    Filed Weights   06/18/21 0556 06/19/21 0436 06/20/21 0412  Weight: 115.2 kg 115.4 kg 116.5 kg    Examination:  General exam: NAD Respiratory system: CTA Cardiovascular system:  S 1, S 2 RRR Gastrointestinal system: BS present,, Soft, nt Central nervous system:Alert,.  Extremities: right BKA, left foot with edema, dressing in place.   Data Reviewed: I have personally reviewed following labs and imaging studies  CBC: Recent Labs  Lab 06/13/21 2017 06/14/21 0213 06/16/21 0214 06/17/21 0059 06/18/21 0058 06/19/21 0043 06/20/21 0300  WBC 21.7*   < > 16.8* 9.0 6.7 7.6 6.5  NEUTROABS 19.5*  --   --   --   --   --   --   HGB 12.1*   < > 9.6* 9.3* 9.7* 9.8* 10.0*  HCT 36.8*   < > 29.7* 28.0* 29.5* 30.1* 29.6*  MCV 91.3   < > 92.8 91.2 90.8 91.5 89.2  PLT 358   < > 269 248 211 215 245   < > = values in this interval not displayed.    Basic Metabolic Panel: Recent Labs  Lab 06/15/21 0120 06/16/21 0214 06/17/21 0836 06/18/21 0715  06/20/21 0300  NA 134* 135 133* 133* 136  K 3.8 3.4* 3.0* 3.3* 2.9*  CL 96* 96* 97* 95* 101  CO2 24 24 25 25 23   GLUCOSE 280* 193* 112* 217* 87  BUN 40* 32* 43* 32* 36*  CREATININE 5.36* 4.19* 5.12* 4.71* 5.75*  CALCIUM 8.1* 7.9* 7.7* 7.8* 7.9*  PHOS  --   --  5.8*  --   --     GFR: Estimated Creatinine Clearance: 16.9 mL/min (A) (by C-G formula based on SCr of 5.75 mg/dL (H)). Liver Function Tests: Recent Labs  Lab 06/13/21 2017 06/17/21 0836  AST 36  --   ALT 22  --   ALKPHOS 123  --   BILITOT 1.1  --   PROT 6.8  --   ALBUMIN 2.7* 2.0*    No results for input(s): LIPASE, AMYLASE in the last 168 hours. No results for input(s): AMMONIA in  the last 168 hours. Coagulation Profile: Recent Labs  Lab 06/13/21 2017 06/14/21 0213  INR 1.5* 1.5*    Cardiac Enzymes: No results for input(s): CKTOTAL, CKMB, CKMBINDEX, TROPONINI in the last 168 hours. BNP (last 3 results) No results for input(s): PROBNP in the last 8760 hours. HbA1C: No results for input(s): HGBA1C in the last 72 hours.  CBG: Recent Labs  Lab 06/20/21 0236 06/20/21 0501 06/20/21 0532 06/20/21 0719 06/20/21 0808  GLUCAP 101* 67* 102* 60* 130*    Lipid Profile: No results for input(s): CHOL, HDL, LDLCALC, TRIG, CHOLHDL, LDLDIRECT in the last 72 hours. Thyroid Function Tests: No results for input(s): TSH, T4TOTAL, FREET4, T3FREE, THYROIDAB in the last 72 hours. Anemia Panel: No results for input(s): VITAMINB12, FOLATE, FERRITIN, TIBC, IRON, RETICCTPCT in the last 72 hours. Sepsis Labs: Recent Labs  Lab 06/13/21 1940 06/13/21 2200 06/14/21 0213 06/14/21 0217  PROCALCITON  --   --  3.07  --   LATICACIDVEN 3.5* 3.9*  --  2.4*     Recent Results (from the past 240 hour(s))  Blood Culture (routine x 2)     Status: None   Collection Time: 06/13/21  7:40 PM   Specimen: BLOOD  Result Value Ref Range Status   Specimen Description BLOOD RIGHT ANTECUBITAL  Final   Special Requests   Final     BOTTLES DRAWN AEROBIC AND ANAEROBIC Blood Culture results may not be optimal due to an inadequate volume of blood received in culture bottles   Culture   Final    NO GROWTH 5 DAYS Performed at [facility] [address] [place], [place] [address]    Report Status 06/18/2021 FINAL  Final  Resp Panel by RT-PCR (Flu A&B, Covid) Nasopharyngeal Swab     Status: None   Collection Time: 06/13/21  8:17 PM   Specimen: Nasopharyngeal Swab; Nasopharyngeal(NP) swabs in vial transport medium  Result Value Ref Range Status   SARS Coronavirus 2 by RT PCR NEGATIVE NEGATIVE Final    Comment: ([name]) SARS-CoV-2 target nucleic acids are NOT DETECTED.  The SARS-CoV-2 RNA is generally detectable in upper respiratory specimens during the acute phase of infection. The lowest concentration of SARS-CoV-2 viral copies this assay can detect is 138 copies/mL. A negative result does not preclude SARS-Cov-2 infection and should not be used as the sole basis for treatment or other patient management decisions. A negative result may occur with  improper specimen collection/handling, submission of specimen other than nasopharyngeal swab, presence of viral mutation(s) within the areas targeted by this assay, and inadequate number of viral copies(<138 copies/mL). A negative result must be combined with clinical observations, patient history, and epidemiological information. The expected result is Negative.  Fact Sheet for Patients:  EntrepreneurPulse.com.au  Fact Sheet for Healthcare Providers:  IncredibleEmployment.be  This test is no t yet approved or cleared by the [place] FDA and  has been authorized for detection and/or diagnosis of SARS-CoV-2 by FDA under an Emergency Use Authorization (EUA). This EUA will remain  in effect (meaning this test can be used) for the duration of the COVID-19 declaration under Section 564(b)(1) of the Act, 21 U.S.C.section  360bbb-3(b)(1), unless the authorization is terminated  or revoked sooner.       Influenza A by PCR NEGATIVE NEGATIVE Final   Influenza B by PCR NEGATIVE NEGATIVE Final    Comment: ([name]) The Xpert Xpress SARS-CoV-2/FLU/RSV plus assay is intended as an aid in the diagnosis of influenza from Nasopharyngeal swab specimens and  should not be used as a sole basis for treatment. Nasal washings and aspirates are unacceptable for Xpert Xpress SARS-CoV-2/FLU/RSV testing.  Fact Sheet for Patients: EntrepreneurPulse.com.au  Fact Sheet for Healthcare Providers: IncredibleEmployment.be  This test is not yet approved or cleared by the [place] FDA and has been authorized for detection and/or diagnosis of SARS-CoV-2 by FDA under an Emergency Use Authorization (EUA). This EUA will remain in effect (meaning this test can be used) for the duration of the COVID-19 declaration under Section 564(b)(1) of the Act, 21 U.S.C. section 360bbb-3(b)(1), unless the authorization is terminated or revoked.  Performed at [facility], [place] [address] [place], [place] [address]   Blood Culture (routine x 2)     Status: None   Collection Time: 06/13/21  8:50 PM   Specimen: BLOOD RIGHT HAND  Result Value Ref Range Status   Specimen Description BLOOD RIGHT HAND  Final   Special Requests   Final    BOTTLES DRAWN AEROBIC AND ANAEROBIC Blood Culture adequate volume   Culture   Final    NO GROWTH 5 DAYS Performed at [facility], [place] [address], [place], [place] [address]    Report Status 06/18/2021 FINAL  Final  Urine Culture     Status: Abnormal   Collection Time: 06/14/21 12:20 PM   Specimen: In/Out Cath Urine  Result Value Ref Range Status   Specimen Description IN/OUT CATH URINE  Final   Special Requests   Final    NONE Performed at [facility], [place] [address] [place], [place] [address]    Culture >=100,000 COLONIES/mL ENTEROCOCCUS FAECALIS  (A)  Final   Report Status 06/17/2021 FINAL  Final   Organism ID, Bacteria ENTEROCOCCUS FAECALIS (A)  Final      Susceptibility   Enterococcus faecalis - MIC*    AMPICILLIN <=2 SENSITIVE Sensitive     NITROFURANTOIN <=16 SENSITIVE Sensitive     VANCOMYCIN 1 SENSITIVE Sensitive     * >=100,000 COLONIES/mL ENTEROCOCCUS FAECALIS  Aerobic/Anaerobic Culture w Gram Stain (surgical/deep wound)     Status: None (Preliminary result)   Collection Time: 06/14/21  8:14 PM   Specimen: PATH GI Other; Tissue  Result Value Ref Range Status   Specimen Description WOUND PENIS  Final   Special Requests DISCHARGE  Final   Gram Stain   Final    NO ORGANISMS SEEN SQUAMOUS EPITHELIAL CELLS PRESENT ABUNDANT WBC PRESENT,BOTH PMN AND MONONUCLEAR MODERATE GRAM POSITIVE COCCI Performed at [facility], [address] [place], [place] [address]    Culture   Final    FEW ENTEROCOCCUS FAECALIS FEW GROUP B STREP(S.AGALACTIAE)ISOLATED TESTING AGAINST S. AGALACTIAE NOT ROUTINELY PERFORMED DUE TO PREDICTABILITY OF AMP/PEN/VAN SUSCEPTIBILITY. NO ANAEROBES ISOLATED; CULTURE IN Harold FOR 5 DAYS    Report Status PENDING  Incomplete   Organism ID, Bacteria ENTEROCOCCUS FAECALIS  Final      Susceptibility   Enterococcus faecalis - MIC*    AMPICILLIN <=2 SENSITIVE Sensitive     VANCOMYCIN 1 SENSITIVE Sensitive     GENTAMICIN SYNERGY SENSITIVE Sensitive     * FEW ENTEROCOCCUS FAECALIS  Fungus Culture With Stain     Status: None (Preliminary result)   Collection Time: 06/14/21  8:14 PM  Result Value Ref Range Status   Fungus Stain Final report  Final    Comment: ([name]) Performed At: [facility] [address] [place], [place] [id number] Rush Farmer MD YH:[id number]    Fungus (Mycology) Culture PENDING  Incomplete   Fungal Source WOUND  Final    Comment: PENIS DISHARGE Performed at [facility], [place] [address], [place], [place] [address]   Fungus Culture Result     Status: None    Collection Time: 06/14/21  8:14 PM  Result Value Ref Range Status   Result 1 Comment  Final    Comment: ([name]) KOH/Calcofluor preparation:  no fungus observed. Performed At: [facility] [place], [place] [id number] Rush Farmer MD HE:[id number]   Surgical PCR screen     Status: Abnormal   Collection Time: 06/14/21 11:20 PM   Specimen: Nasal Mucosa; Nasal Swab  Result Value Ref Range Status   MRSA, PCR POSITIVE (A) NEGATIVE Final    Comment: RESULT CALLED TO, READ BACK BY AND VERIFIED WITH: RN CAREY D. 06/15/21@1 :09 BY TW    Staphylococcus aureus POSITIVE (A) NEGATIVE Final    Comment: ([name]) The Xpert SA Assay (FDA approved for NASAL specimens in patients 80 years of age and older), is one component of a comprehensive surveillance program. It is not intended to diagnose infection nor to guide or monitor treatment. Performed at [facility], [place] [address], [place], [place] [address]           Radiology Studies: VAS [place] ABI WITH/WO TBI  Result Date: 06/19/2021  LOWER EXTREMITY DOPPLER STUDY Patient Name:  LOC FEINSTEIN  Date of Exam:   06/18/2021 Medical Rec #: [id number]      Accession #:    [id number] Date of Birth: Jul 24, 1956      Patient Gender: M Patient Age:   69 years Exam Location:  [facility] Procedure:      VAS [place] ABI WITH/WO TBI Referring Phys: Adrian Prows --------------------------------------------------------------------------------  Indications: Ulceration, and peripheral artery disease. High Risk Factors: Hyperlipidemia, Diabetes, past history of smoking. Other Factors: ESRD, on dialysis, atrial fibrillation.  Vascular Interventions: Right BKA, Left CEA,. Comparison Study: Prior limited ABI done 08/19/20 Performing Technologist: Sharion Dove RVS  Examination Guidelines: A complete evaluation includes at minimum, Doppler waveform signals and systolic blood pressure reading at the level of bilateral brachial, anterior tibial, and  posterior tibial arteries, when vessel segments are accessible. Bilateral testing is considered an integral part of a complete examination. Photoelectric Plethysmograph (PPG) waveforms and toe systolic pressure readings are included as required and additional duplex testing as needed. Limited examinations for reoccurring indications may be performed as noted.  ABI Findings: +---------+------------------+-----+--------+--------+ Right    Rt Pressure (mmHg)IndexWaveformComment  +---------+------------------+-----+--------+--------+ Brachial 136                    biphasic         +---------+------------------+-----+--------+--------+ PTA                                     BKA      +---------+------------------+-----+--------+--------+ DP                                      BKA      +---------+------------------+-----+--------+--------+ Great Toe                               BKA      +---------+------------------+-----+--------+--------+ +---------+------------------+-----+--------+--------------------------+ Left     Lt Pressure (  mmHg)IndexWaveformComment                    +---------+------------------+-----+--------+--------------------------+ Brachial                                Restricted/dialysis access +---------+------------------+-----+--------+--------------------------+ PTA      254               1.87                                    +---------+------------------+-----+--------+--------------------------+ DP       86                0.63                                    +---------+------------------+-----+--------+--------------------------+ Great Toe79                0.58                                    +---------+------------------+-----+--------+--------------------------+ +-------+---------------------+-----------+------------+------------+ ABI/TBIToday's ABI          Today's TBIPrevious ABIPrevious TBI  +-------+---------------------+-----------+------------+------------+ Right  BKA                  BKA                                 +-------+---------------------+-----------+------------+------------+ Left   non compressible/0.630.58                   0.58         +-------+---------------------+-----------+------------+------------+ Left TBIs appear essentially unchanged compared to prior study on 08/19/20.  Summary: Right: BKA. Left: Resting left ankle-brachial index indicates noncompressible left lower extremity arteries. The left toe-brachial index is abnormal.  *See table(s) above for measurements and observations.  Electronically signed by Orlie Pollen on 06/19/2021 at 6:49:05 AM.    Final         Scheduled Meds:  amLODipine  5 mg Oral Daily   atorvastatin  40 mg Oral QHS   carvedilol  37.5 mg Oral BID WC   Chlorhexidine Gluconate Cloth  6 each Topical Q0600   darbepoetin (ARANESP) injection - DIALYSIS  100 mcg Intravenous Q Tue-HD   fentaNYL  1 patch Transdermal Q72H   insulin aspart  0-9 Units Subcutaneous Q4H   sodium chloride flush  3 mL Intravenous Q12H   sodium chloride flush  3 mL Intravenous Q12H   Continuous Infusions:  sodium chloride     ceFEPime (MAXIPIME) IV Stopped (06/17/21 1728)   dextrose 5% lactated ringers Stopped (06/20/21 0525)   famotidine 20 mg (06/20/21 0828)   heparin 1,800 Units/hr (06/20/21 0019)   metronidazole 500 mg (06/19/21 2121)   vancomycin 1,000 mg (06/17/21 1037)     LOS: 6 days    Time spent: 35 minutes.     Elmarie Shiley, MD [facility]   If 7PM-7AM, please contact night-coverage [email or web address]  06/20/2021, 8:39 AM

## 2021-06-20 NOTE — Progress Notes (Signed)
ANTICOAGULATION CONSULT NOTE Pharmacy Consult for heparin Indication: atrial fibrillation  Allergies  Allergen Reactions   Contrast Media [Iodinated Diagnostic Agents]     Difficulty breathing     Ivp Dye [Iodinated Diagnostic Agents] Anaphylaxis and Other (See Comments)    Breathing problems, altered mental state    Adhesive [Tape] Rash    Rash after 3-4 days use    Patient Measurements: Height: 6\' 2"  (188 cm) Weight: 116.5 kg (256 lb 13.4 oz) IBW/kg (Calculated) : 82.2 Heparin Dosing Weight: 105kg  Vital Signs: Temp: 98.4 F (36.9 C) (10/11 1551) Temp Source: Oral (10/11 1551) BP: 134/74 (10/11 1551) Pulse Rate: 84 (10/11 1551)  Labs: Recent Labs    06/18/21 0058 06/18/21 0715 06/18/21 1004 06/19/21 0043 06/19/21 1240 06/20/21 0300  HGB 9.7*  --   --  9.8*  --  10.0*  HCT 29.5*  --   --  30.1*  --  29.6*  PLT 211  --   --  215  --  245  APTT 56*  --   --   --   --   --   HEPARINUNFRC 0.19*  --    < > 0.16* 0.34 0.47  CREATININE  --  4.71*  --   --   --  5.75*   < > = values in this interval not displayed.     Estimated Creatinine Clearance: 16.9 mL/min (A) (by C-G formula based on SCr of 5.75 mg/dL (H)).   Assessment: 65 y.o. male with h/o Afib, Eliqus from  on hold for procedures. Heparin drip rate 1800 uts/hr, heparin level therapeutic  0.47 CBC ok  S/p angiogram - resume heparin 6hr after procedure end  Goal of Therapy:  Heparin level 0.3-0.7 units/ml Monitor platelets by anticoagulation protocol: Yes   Plan:  Resume  heparin 1800 units/hr at 6pm Daily heparin level, CBC    Bonnita Nasuti Pharm.D. CPP, BCPS Clinical Pharmacist [phone] 06/20/2021 4:19 PM

## 2021-06-20 NOTE — [place] FL2 (Signed)
[place] MEDICAID FL2 LEVEL OF CARE SCREENING TOOL     IDENTIFICATION  Patient Name: [name] Birthdate: 1956-03-04 Sex: male Admission Date (Current Location): 06/13/2021  [facility] and [place] Number:  Herbalist and Address:  [facility], [place] [address] [place], [place] [address]      Provider Number: [id number]  Attending Physician Name and Address:  Elmarie Shiley, MD  Relative Name and Phone Number:       Current Level of Care:   Recommended Level of Care: [place] Prior Approval Number:    Date Approved/Denied:   PASRR Number: [id number]  Discharge Plan: SNF    Current Diagnoses: Patient Active Problem List   Diagnosis Date Noted   Severe sepsis with lactic acidosis ([place]) 06/14/2021   Infection associated with implanted penile prosthesis ([place]) 06/14/2021   ESRD on hemodialysis ([place]) 06/14/2021   Paroxysmal atrial fibrillation with RVR ([place]) 06/14/2021   Carotid stenosis 06/15/2020   Dyslipidemia 06/15/2020   Severe nonproliferative diabetic retinopathy of right eye, with macular edema, associated with type 2 diabetes mellitus ([place]) 01/18/2020   Severe nonproliferative diabetic retinopathy of left eye, with macular edema, associated with type 2 diabetes mellitus ([place]) 01/04/2020   Retinal hemorrhage of right eye 01/04/2020   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) 11/04/2017   Chronic venous hypertension (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 ([place]) 12/11/2016    Ischemic ulcer of left foot, limited to breakdown of skin ([place]) 11/12/2016   Unilateral primary osteoarthritis, left knee 10/11/2016   History of right below knee amputation ([place]) 07/12/2016   Diabetic foot infection ([place]) 05/04/2016   Insulin dependent type 2 diabetes mellitus ([place]) 12/16/2015   Amputated toe ([place]) 10/21/2015   Leg edema, left 09/03/2015   CAD (dz of distal, mid and proximal RCA with implantation of 3 overlapping drug-eluting stent,) 09/03/2015   Chronic diastolic heart failure, NYHA class 2 ([place]) 09/03/2015   Angina pectoris associated with type 2 diabetes mellitus ([place]) 10/28/2014   Hyperlipidemia 08/25/2014   Limb pain 03/20/2013   DJD (degenerative joint disease) 09/25/2012   Migraine 09/25/2012   Neuropathy 09/25/2012   Restless legs syndrome (RLS) 09/25/2012   Chronic obstructive pulmonary disease, unspecified ([place]) 04/25/2012   Unknown cause of morbidity or mortality 04/25/2012   Chronic total occlusion of artery of the extremities ([place]) 04/08/2012   Onychomycosis 02/01/2012   Occlusion and stenosis of carotid artery without mention of cerebral infarction 06/12/2011   GERD (gastroesophageal reflux disease) 05/08/2011   Barrett's esophagus without dysplasia 05/08/2011   Former tobacco use 02/01/2011    Orientation RESPIRATION BLADDER Height & Weight     Time, Self, Situation, Place  Normal Continent, Indwelling catheter Weight: 256 lb 13.4 oz (116.5 kg) Height:  6\' 2"  (188 cm)  BEHAVIORAL SYMPTOMS/MOOD NEUROLOGICAL BOWEL NUTRITION STATUS      Continent Diet (See DC summary)  AMBULATORY STATUS COMMUNICATION OF NEEDS Skin   Extensive Assist Verbally Normal, Surgical wounds (foot/ankle)  Personal Care Assistance Level of Assistance  Bathing, Dressing, Feeding Bathing Assistance: Maximum assistance Feeding assistance: Limited assistance Dressing Assistance: Maximum assistance     Functional Limitations Info  Sight, Speech,  Hearing Sight Info: Impaired (Wears glasses) Hearing Info: Adequate Speech Info: Adequate    SPECIAL CARE FACTORS FREQUENCY  PT (By licensed PT), OT (By licensed OT)     PT Frequency: 5x a week OT Frequency: 5x a week            Contractures Contractures Info: Not present    Additional Factors Info  Code Status, Allergies, Insulin Sliding Scale Code Status Info: Full Allergies Info: Contrast Media (Iodinated Diagnostic Agents)   Ivp Dye (Iodinated Diagnostic Agents)   Adhesive (Tape   Insulin Sliding Scale Info: See DC summary       Current Medications (06/20/2021):  This is the current hospital active medication list Current Facility-Administered Medications  Medication Dose Route Frequency Provider Last Rate Last Admin   0.9 %  sodium chloride infusion  250 mL Intravenous PRN Adrian Prows, MD       0.9 %  sodium chloride infusion  250 mL Intravenous PRN Patwardhan, Manish J, MD       acetaminophen (TYLENOL) tablet 1,000 mg  1,000 mg Oral Q6H PRN Zada Finders R, MD   1,000 mg at 06/14/21 1802   Or   acetaminophen (TYLENOL) suppository 650 mg  650 mg Rectal Q6H PRN Lenore Cordia, MD       acetaminophen (TYLENOL) tablet 650 mg  650 mg Oral Q4H PRN Patwardhan, Manish J, MD       amLODipine (NORVASC) tablet 5 mg  5 mg Oral Daily Regalado, Belkys A, MD   5 mg at 06/20/21 0817   atorvastatin (LIPITOR) tablet 40 mg  40 mg Oral QHS Zada Finders R, MD   40 mg at 06/19/21 2116   carvedilol (COREG) tablet 37.5 mg  37.5 mg Oral BID WC Zada Finders R, MD   37.5 mg at 06/20/21 0817   ceFEPIme (MAXIPIME) 2 g in sodium chloride 0.9 % 100 mL IVPB  2 g Intravenous Q T,Th,Sat-1800 von Dohlen, Stasia Cavalier, RPH   Stopped at 06/17/21 1728   Chlorhexidine Gluconate Cloth 2 % PADS 6 each  6 each Topical Q0600 Loren Racer, PA-C   6 each at 06/20/21 0419   Darbepoetin Alfa (ARANESP) injection 100 mcg  100 mcg Intravenous Q Tue-HD Gean Quint, MD       dextrose 5 % in lactated ringers infusion    Intravenous Continuous Chotiner, Yevonne Aline, MD   Stopped at 06/20/21 0525   fentaNYL (DURAGESIC) 50 MCG/HR 1 patch  1 patch Transdermal Q72H Chotiner, Yevonne Aline, MD   1 patch at 06/19/21 0817   hydrALAZINE (APRESOLINE) injection 10 mg  10 mg Intravenous Q6H PRN Regalado, Belkys A, MD   10 mg at 06/19/21 0803   hydrALAZINE (APRESOLINE) injection 5 mg  5 mg Intravenous Q20 Min PRN Patwardhan, Manish J, MD       HYDROmorphone (DILAUDID) injection 1 mg  1 mg Intravenous Q4H PRN Zada Finders R, MD   1 mg at 06/18/21 1529   insulin aspart (novoLOG) injection 0-9 Units  0-9 Units Subcutaneous Q4H Zada Finders R, MD   1 Units at 06/19/21 1630   labetalol (NORMODYNE) injection 10 mg  10 mg Intravenous Q10 min PRN Patwardhan, Manish J, MD       metroNIDAZOLE (FLAGYL) IVPB 500 mg  500 mg Intravenous Q12H  Zada Finders R, MD 100 mL/hr at 06/20/21 1133 500 mg at 06/20/21 1133   ondansetron (ZOFRAN) tablet 4 mg  4 mg Oral Q6H PRN Lenore Cordia, MD       Or   ondansetron (ZOFRAN) injection 4 mg  4 mg Intravenous Q6H PRN Lenore Cordia, MD       ondansetron (ZOFRAN) injection 4 mg  4 mg Intravenous Q6H PRN Patwardhan, Manish J, MD       sodium chloride flush (NS) 0.9 % injection 3 mL  3 mL Intravenous Q12H Zada Finders R, MD   3 mL at 06/19/21 0820   sodium chloride flush (NS) 0.9 % injection 3 mL  3 mL Intravenous Q12H Adrian Prows, MD       sodium chloride flush (NS) 0.9 % injection 3 mL  3 mL Intravenous PRN Adrian Prows, MD       sodium chloride flush (NS) 0.9 % injection 3 mL  3 mL Intravenous Q12H Patwardhan, Manish J, MD   3 mL at 06/20/21 1122   sodium chloride flush (NS) 0.9 % injection 3 mL  3 mL Intravenous PRN Patwardhan, Manish J, MD       vancomycin (VANCOCIN) IVPB 1000 mg/200 mL premix  1,000 mg Intravenous Q T,Th,Sa-HD Franky Macho, RPH 200 mL/hr at 06/17/21 1037 1,000 mg at 06/17/21 1037     Discharge Medications: Please see discharge summary for a list of discharge  medications.  Relevant Imaging Results:  Relevant Lab Results:   Additional Information [phone]. /T/S  [place] 12:10 for 12:25 chair time- T/TH- transported by [employer], [place] transport  [place], [place]

## 2021-06-20 NOTE — [facility] Notes (Signed)
CBG 60. Pt sweaty. 50 ml D50 given. Fluids changed to D51/2 @50  per cath lab.  Will continue to monitor

## 2021-06-20 NOTE — Consult Note (Signed)
Palliative Care Consult Note                                  Date: 06/20/2021   Patient Name: [name]  DOB: 12/28/1955  MRN: [id number]  Age / Sex: 65 y.o., male  PCP: [name], PA-C Referring Physician: Elmarie Shiley, MD  Reason for Consultation: Establishing goals of care  HPI/Patient Profile: 65 y.o. 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 and [name], paroxysmal A. fib on Eliquis, ESRD on hemodialysis TTS, chronic diastolic heart failure, diabetes type 2, hypertension, dyslipidemia admitted on 06/13/2021 with LE wound infection. There was a previous discussion of potential need for transtibial amputation.  He is also followed by [name] for PVD.  PMT was consulted for goals of care discussion.  Per hospitalist note the patient's wife states the patient previously indicated if he needed "amputation" he would want hospice care instead.  Previously referred for outpatient palliative care for pain management.  Past Medical History:  Diagnosis Date  . Carotid artery occlusion 11/10/10   LEFT CAROTID ENDARTERECTOMY  . Chronic kidney disease   . Complication of anesthesia    BP WENT UP AT [facility] "  . COPD (chronic obstructive pulmonary disease) ([place])    pt denies this dx as of 06/01/20 - no inhaler   . Diabetes mellitus without complication ([place])   . Diverticulitis   . Diverticulosis of colon (without mention of hemorrhage)   . DJD (degenerative joint disease)    knees/hands/feet/back/neck  . Fatty liver   . Full dentures   . GERD (gastroesophageal reflux disease)   . H/O hiatal hernia   . History of blood transfusion    with a past surical procedure per patient 06/01/20  . Hyperlipidemia   . Hypertension   . Neuromuscular disorder ([place])    peripheral neuropathy  . Non-pressure  chronic ulcer of other part of left foot limited to breakdown of skin ([place]) 11/12/2016  . 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 PAD (peripheral artery disease) ([place])    Distal aortogram June 2012. Atherectomy left popliteal artery July 2012.   . Pseudoclaudication 11/15/2018  . Sleep apnea    pt denies this dx as of 06/01/20  . Slurred speech    AS PER WIFE IN D/C NOTE 11/10/10  . 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 Unstable angina ([place]) 09/16/2018  . Wears glasses     Subjective:   This NP Walden Field reviewed medical records, received report from team, assessed the patient and then meet at the patient's bedside to discuss diagnosis, prognosis, GOC, EOL wishes disposition and options.  I met with the patient at the bedside.  However, he was still quite sleepy from his angiogram just prior.  I offered, and he accepted, for me to return tomorrow.  I did speak to his wife over the phone and we had a extensive discussion as detailed below.   Concept of Palliative Care was introduced as specialized medical care for people and their families living with serious illness.  If focuses on providing relief from the symptoms and stress of a serious illness.  The goal is to improve quality of life for both the patient and the family. Values and goals of care important  to patient and family were attempted to be elicited.  Created space and opportunity for patient  and family to explore thoughts and feelings regarding current medical situation   Natural trajectory and current clinical status were discussed. Questions and concerns addressed. Patient  encouraged to call with questions or concerns.    Patient/Family Understanding of Illness: The patient's wife, [name], states that just before this recent illness he was referred to palliative medicine because his current PCP is phasing out narcotic use and referred to pain management.  However, they could not find a pain management service that they were  comfortable with.  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  She states he would likely need a lot of time to think about this.  He is not sure if he would want an amputation.  If he had a partial foot amputation he could potentially be cared for at home, but if a full leg amputation was needed it would be very difficult.  He also has kidney failure and is hemodialysis dependent.  Today's Discussion: I discussed with the patient's wife current situation.  Discussed different options related to palliative management.  We discussed the role of palliative management as an inpatient service as well as palliative management as an outpatient service.  She states that she is still working and works till 5:00 on most days.  However, we did agree that we could meet at 330 on Friday for further discussions.  She emphasizes the patient would likely want some time to decide.  I did inform her that I am not sure if an amputation was recommended whether it would need to be urgent inpatient versus outpatient.  She states she will speak with [name] to get his opinion.  Provided general and emotional support through therapeutic listening.  Answered all questions and addressed all concerns.  Review of Systems  Constitutional:        "Not doing too great." Asked for a visit tomorrow instead of today.   Objective:   Primary Diagnoses: Present on Admission: . Severe sepsis with lactic acidosis ([place]) . Peripheral artery disease ([place]) . CAD (dz of distal, mid and proximal RCA with implantation of 3 overlapping drug-eluting stent,) . 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) . Paroxysmal atrial fibrillation with RVR ([place]) . Chronic venous hypertension (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 ([facility])   Physical Exam Vitals and nursing note reviewed.  Constitutional:      General: He is sleeping.     Appearance: Normal  appearance. He is normal weight.  HENT:     Head: Normocephalic and atraumatic.  Pulmonary:     Effort: Pulmonary effort is normal. No respiratory distress.  Abdominal:     General: Abdomen is flat.  Skin:    General: Skin is warm and dry.  Neurological:     Mental Status: He is easily aroused.    Vital Signs:  BP 134/71 (BP Location: Right Arm)   Pulse 71   Temp 98.5 F (36.9 C) (Oral)   Resp 19   Ht 6' 2"  (1.88 m)   Wt 116.5 kg   SpO2 98%   BMI 32.98 kg/m   Palliative Assessment/Data: Unable to calculate at this time    Advanced Care Planning:   Primary Decision Maker: PATIENT  Code Status/Advance Care Planning: Full code  A discussion was had today regarding advanced directives. Concepts specific to code status, artifical feeding  and hydration, continued IV antibiotics and rehospitalization was had.  The difference between a aggressive medical intervention path and a palliative comfort care path for this patient at this time was had.   Decisions/Changes to ACP: None at this time  Assessment & Plan:   Impression: 65 year old male with multiple chronic illnesses as outlined above.  Now with significant lower extremity wound infection status post angiogram with no apparent stenting needs.  Possible need for amputation, although the extent of amputation unsure.  The patient seems to be a bit withdrawn.  There is some concern about whether the patient would be able to return home versus needing rehab.  The patient has previously mentioned to his wife that if he needed another amputation he would prefer hospice, although this has not been fully flushed out and decided.  SUMMARY OF RECOMMENDATIONS   Continue to treat the treatable Remain full code Plan family meeting on Friday for further goals discussions Will benefit from outpatient palliative care at the very least PMT will continue to follow  Symptom Management:  Per primary team PMT is available as needed  abscess  Prognosis:  Unable to determine  Discharge Planning:  To Be Determined   Discussed with: Patient's wife, medical team, nursing team    Thank you for allowing [place] to participate in the care of HAKEEM FRAZZINI PMT will continue to support holistically.  Time Total: 70 min  Greater than 50%  of this time was spent counseling and coordinating care related to the above assessment and plan.  Signed by: Walden Field, NP Palliative Medicine Team  Team Phone # [phone] (Nights/Weekends)  06/20/2021, 11:47 AM

## 2021-06-20 NOTE — [facility] Notes (Signed)
Hypoglycemic Event  CBG: 60   Treatment: D50 50 mL (25 gm)  Symptoms: Sweaty  Follow-up CBG: Time: CBG Result:  Possible Reasons for Event: Inadequate meal intake  Comments/MD notified:     Delle Reining

## 2021-06-20 NOTE — [facility] Notes (Signed)
Patient called out stating that he was "sweaty". Patient's CBG checked 60. Patient given 25 ml D50 per protocol. Repeat CBG 101.   Notified Dr. Tonie Griffith. Orders given for D5LR @ 50. Patient is NPO for procedure and he is not eating and not hungry.   Maybe HS insulin needs to be adjusted? Will continue to monitor.

## 2021-06-21 ENCOUNTER — Ambulatory Visit: Payer: 59 | Admitting: Family

## 2021-06-21 ENCOUNTER — Telehealth: Payer: Self-pay

## 2021-06-21 DIAGNOSIS — R6520 Severe sepsis without septic shock: Secondary | ICD-10-CM | POA: Diagnosis not present

## 2021-06-21 DIAGNOSIS — A419 Sepsis, unspecified organism: Secondary | ICD-10-CM | POA: Diagnosis not present

## 2021-06-21 DIAGNOSIS — E872 Acidosis, unspecified: Secondary | ICD-10-CM | POA: Diagnosis not present

## 2021-06-21 LAB — GLUCOSE, CAPILLARY
Glucose-Capillary: 213 mg/dL — ABNORMAL HIGH (ref 70–99)
Glucose-Capillary: 203 mg/dL — ABNORMAL HIGH (ref 70–99)
Glucose-Capillary: 261 mg/dL — ABNORMAL HIGH (ref 70–99)
Glucose-Capillary: 332 mg/dL — ABNORMAL HIGH (ref 70–99)
Glucose-Capillary: 267 mg/dL — ABNORMAL HIGH (ref 70–99)

## 2021-06-21 LAB — RENAL FUNCTION PANEL
Albumin: 2.5 g/dL — ABNORMAL LOW (ref 3.5–5.0)
Anion gap: 9 (ref 5–15)
BUN: 16 mg/dL (ref 8–23)
CO2: 25 mmol/L (ref 22–32)
Calcium: 8 mg/dL — ABNORMAL LOW (ref 8.9–10.3)
Chloride: 97 mmol/L — ABNORMAL LOW (ref 98–111)
Creatinine, Ser: 3.29 mg/dL — ABNORMAL HIGH (ref 0.61–1.24)
GFR, Estimated: 20 mL/min — ABNORMAL LOW (ref >60)
Glucose, Bld: 216 mg/dL — ABNORMAL HIGH (ref 70–99)
Phosphorus: 3.7 mg/dL (ref 2.5–4.6)
Potassium: 3.9 mmol/L (ref 3.5–5.1)
Sodium: 131 mmol/L — ABNORMAL LOW (ref 135–145)

## 2021-06-21 LAB — CBC
HCT: 30.5 % — ABNORMAL LOW (ref 39.0–52.0)
Hemoglobin: 10.5 g/dL — ABNORMAL LOW (ref 13.0–17.0)
MCH: 30.7 pg (ref 26.0–34.0)
MCHC: 34.4 g/dL (ref 30.0–36.0)
MCV: 89.2 fL (ref 80.0–100.0)
Platelets: 237 10*3/uL (ref 150–400)
RBC: 3.42 MIL/uL — ABNORMAL LOW (ref 4.22–5.81)
RDW: 13.5 % (ref 11.5–15.5)
WBC: 7.3 10*3/uL (ref 4.0–10.5)
nRBC: 0 % (ref 0.0–0.2)

## 2021-06-21 LAB — HEPARIN LEVEL (UNFRACTIONATED): Heparin Unfractionated: 0.49 IU/mL (ref 0.30–0.70)

## 2021-06-21 MED ORDER — OXYBUTYNIN CHLORIDE 5 MG PO TABS
5.0000 mg | ORAL_TABLET | Freq: Three times a day (TID) | ORAL | Status: DC
  Administered 2021-06-21 – 2021-06-30 (×28): 5 mg via ORAL
  Filled 2021-06-21 (×29): qty 1

## 2021-06-21 NOTE — [facility] Notes (Signed)
Feels spasm and leaks around the catheter  Catheter otherwise draining clear urine Catheter easily flushed with saline As noted send urine for c/s and treat spasms with oxybutynin as ordered

## 2021-06-21 NOTE — [facility] Notes (Addendum)
Pt's foley cath leaking. RN attempted to insert 10 ml of NS to the balloon but it still leaking around the site and in pt's gown. MD notified. Paged Urologist on call dr. Wendy Poet and received verbal orders for culture and oxybutynin. Urologist stated that he would come to check pt later.  Will continue to monitor the pt.  Lavenia Atlas, RN

## 2021-06-21 NOTE — H&P (View-Only) (Signed)
Angiogram reviewed, discussed with [name].  [name] has spoken to [name].  Patient has a nonhealing ulcer on left heel.  He has an occluded left AT.  His ulcer is not inline flow with left AT, but potentially could have improvement in pedal arch circulation but AT revascularization. I explained to the patient and his wife that successful revascularization of the AT would not treat infection, but could limit tissue loss and minimize the degree of amputation.   I will obtain lower extremity duplex to see if there is any hibernating AT vessels that can be accessed in a retrograde manner further revascularization.  We will plan on performing this on Friday, 06/23/2021 at 7:30 AM.  Risks, benefits, alternate options, including but not limited to emergency surgery for acute limb ischemia, compartment syndrome, dissection, perforation, death discussed with the patient.  Patient and wife understand and would like to proceed with revascularization attempt.  NPO after 10/13 midnight.   Harold Mormon, MD Pager: [phone] Office: [phone]

## 2021-06-21 NOTE — Progress Notes (Signed)
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 and [name], paroxysmal A. fib on Eliquis, ESRD on hemodialysis TTS, chronic diastolic heart failure, diabetes type 2, hypertension, dyslipidemia who was sent from dialysis center to the ED for further evaluation of wound infection.  Patient is followed by [name] for chronic left foot wound infection with history of PAD, at follow-up on 10/3 he discussed potential need for transtibial amputation, also followed by [name] due to peripheral vascular disease and plan for angiogram.  Patient was noticed to have increasing swelling and pain around the penis and the scrotum, noted purulent discharge from the urethra and drainage of purulent secretion from  penile prothesis.  In the ED he was found to have leukocytosis white count 21,000, lactic acidosis. He was evaluated by [name] and underwent removal of 3 piece inflatable penile prosthesis on 06/14/2021.   Assessment & Plan:   Severe sepsis, secondary to infected penile prosthesis,  -He underwent removal of 3 piece inflatable penile infected prosthesis -Wound culture: Enterococcus and Streptococcus lactam -Urine culture grew Enterococcus fecaelis.  -On cefepime and vancomycin currently; fluconazole discontinued previously -JP drains removed; Will need foley catheter for another week per urology -issues noted today at bedside with Foley leakage, recommend nurse to reach out to urology for further evaluation given complex case -Urology recommend 1 week of antibiotics.   Left foot infection, abnormal signal abnormalities of  cuboid worrisome for osteomyelitis, cellulitis and mild fasciitis -History of peripheral arterial  disease, right BKA -Consulted with [name] and [name]. -Underwent  arteriogram on 10/11: No significant iliofemoral disease.  Patent left SFA stents.  2/3 vessel below the knee runoff to the foot.  Proximal occluded left AT.  Awaiting final cardiology recommendation -possible revascularization attempt pending family decision in the next few days.,  Tentative plan for revascularization 06/23/2021 at 7:30 AM if family is agreeable pending further work-up with cardiology. -Continue with IV antibiotics.  ESRD on hemodialysis: Nephrology following  Paroxysmal A. fib: Continue with Coreg Continue with heparin drip.  Holding Eliquis in anticipation of arteriogram next week  Diabetes type 2:  Sliding scale insulin Hypoglycemia.  Withhold long-acting insulin.  He report poor oral intake.  CAD status post PCI: Continue with Coreg and atorvastatin Hypertension: Continue with carvedilol, Norvasc. Chronic pain; on fentanyl patch.   Hyponatremia, hypokalemia: Correction with hemodialysis  Obesity Estimated body mass index is 31.7 kg/m as calculated from the following:   Height as of this encounter: 6\' 2"  (1.88 m).   Weight as of this encounter: 112 kg.   DVT prophylaxis: SCD Code Status: Full code Family Communication: No family at bedside, no answer via telephone  Disposition Plan:  Status is: Inpatient  Remains inpatient appropriate because:Inpatient level of care appropriate due to severity of illness  Dispo: The patient is from: Home              Anticipated d/c is to: SNF              Patient currently is not medically stable to d/c.   Difficult to place patient No  Consultants:  Dr [name] urology Nephrology, Dr. Candiss Norse Dr [name] orthopedic surgery Dr [name].   Procedures:  Removal of 3  pieces inflatable penile prothesis  that were infected  Antimicrobials:   Cefepime 10/4  Vancomycin 10/4 Fluconazole. 10/5   Subjective: Patient denies chest pain or shortness of  breath.  He keeps his eyes closed.  He is awaiting arteriogram today.  Objective: Vitals:   06/21/21 0005 06/21/21 0345 06/21/21 0450 06/21/21 0700  BP: (!) 160/92 (!) 160/75 (!) 126/57 (!) 130/58  Pulse: 90 97  90  Resp: 16 20  20   Temp: 98.4 F (36.9 C) 99 F (37.2 C)  99.1 F (37.3 C)  TempSrc: Oral Oral  Oral  SpO2: 99% 96%  97%  Weight: 112 kg     Height:        Intake/Output Summary (Last 24 hours) at 06/21/2021 0735 Last data filed at 06/21/2021 0005 Gross per 24 hour  Intake --  Output 2800 ml  Net -2800 ml    Filed Weights   06/20/21 0412 06/20/21 2046 06/21/21 0005  Weight: 116.5 kg 114.5 kg 112 kg    Examination:  General exam: NAD Respiratory system: CTA Cardiovascular system:  S 1, S 2 RRR Gastrointestinal system: BS present,, Soft, nt Central nervous system:Alert,.  Extremities: right BKA, left foot with edema, dressing in place.   Data Reviewed: I have personally reviewed following labs and imaging studies  CBC: Recent Labs  Lab 06/17/21 0059 06/18/21 0058 06/19/21 0043 06/20/21 0300 06/21/21 0150  WBC 9.0 6.7 7.6 6.5 7.3  HGB 9.3* 9.7* 9.8* 10.0* 10.5*  HCT 28.0* 29.5* 30.1* 29.6* 30.5*  MCV 91.2 90.8 91.5 89.2 89.2  PLT 248 211 215 245 277    Basic Metabolic Panel: Recent Labs  Lab 06/16/21 0214 06/17/21 0836 06/18/21 0715 06/20/21 0300 06/21/21 0150  NA 135 133* 133* 136 131*  K 3.4* 3.0* 3.3* 2.9* 3.9  CL 96* 97* 95* 101 97*  CO2 24 25 25 23 25   GLUCOSE 193* 112* 217* 87 216*  BUN 32* 43* 32* 36* 16  CREATININE 4.19* 5.12* 4.71* 5.75* 3.29*  CALCIUM 7.9* 7.7* 7.8* 7.9* 8.0*  PHOS  --  5.8*  --   --  3.7    GFR: Estimated Creatinine Clearance: 29 mL/min (A) (by C-G formula based on SCr of 3.29 mg/dL (H)). Liver Function Tests: Recent Labs  Lab 06/17/21 0836 06/21/21 0150  ALBUMIN 2.0* 2.5*    No results for input(s): LIPASE, AMYLASE in the last 168 hours. No results for input(s): AMMONIA in the last 168  hours. Coagulation Profile: No results for input(s): INR, PROTIME in the last 168 hours.  Cardiac Enzymes: No results for input(s): CKTOTAL, CKMB, CKMBINDEX, TROPONINI in the last 168 hours. BNP (last 3 results) No results for input(s): PROBNP in the last 8760 hours. HbA1C: No results for input(s): HGBA1C in the last 72 hours.  CBG: Recent Labs  Lab 06/20/21 1553 06/20/21 1759 06/20/21 2016 06/21/21 0350 06/21/21 0719  GLUCAP 190* 265* 316* 213* 203*    Lipid Profile: No results for input(s): CHOL, HDL, LDLCALC, TRIG, CHOLHDL, LDLDIRECT in the last 72 hours. Thyroid Function Tests: No results for input(s): TSH, T4TOTAL, FREET4, T3FREE, THYROIDAB in the last 72 hours. Anemia Panel: No results for input(s): VITAMINB12, FOLATE, FERRITIN, TIBC, IRON, RETICCTPCT in the last 72 hours. Sepsis Labs: No results for input(s): PROCALCITON, LATICACIDVEN in the last 168 hours.   Recent Results (from the past 240 hour(s))  Blood Culture (routine x 2)     Status: None   Collection Time: 06/13/21  7:40 PM   Specimen: BLOOD  Result Value Ref Range Status   Specimen Description BLOOD RIGHT ANTECUBITAL  Final   Special Requests   Final    BOTTLES DRAWN AEROBIC AND ANAEROBIC Blood Culture results may not be optimal due to an inadequate volume of blood received in culture bottles   Culture   Final    NO GROWTH 5 DAYS Performed at [facility] [address], [place], [place] [address]    Report Status 06/18/2021 FINAL  Final  Resp Panel by RT-PCR (Flu A&B, Covid) Nasopharyngeal Swab     Status: None   Collection Time: 06/13/21  8:17 PM   Specimen: Nasopharyngeal Swab; Nasopharyngeal(NP) swabs in vial transport medium  Result Value Ref Range Status   SARS Coronavirus 2 by RT PCR NEGATIVE NEGATIVE Final    Comment: (NOTE) SARS-CoV-2 target nucleic acids are NOT DETECTED.  The SARS-CoV-2 RNA is generally detectable in upper respiratory specimens during the acute phase of  infection. The lowest concentration of SARS-CoV-2 viral copies this assay can detect is 138 copies/mL. A negative result does not preclude SARS-Cov-2 infection and should not be used as the sole basis for treatment or other patient management decisions. A negative result may occur with  improper specimen collection/handling, submission of specimen other than nasopharyngeal swab, presence of viral mutation(s) within the areas targeted by this assay, and inadequate number of viral copies(<138 copies/mL). A negative result must be combined with clinical observations, patient history, and epidemiological information. The expected result is Negative.  Fact Sheet for Patients:  EntrepreneurPulse.com.au  Fact Sheet for Healthcare Providers:  IncredibleEmployment.be  This test is no t yet approved or cleared by the [place] FDA and  has been authorized for detection and/or diagnosis of SARS-CoV-2 by FDA under an Emergency Use Authorization (EUA). This EUA will remain  in effect (meaning this test can be used) for the duration of the COVID-19 declaration under Section 564(b)(1) of the Act, 21 U.S.C.section 360bbb-3(b)(1), unless the authorization is terminated  or revoked sooner.       Influenza A by PCR NEGATIVE NEGATIVE Final   Influenza B by PCR NEGATIVE NEGATIVE Final    Comment: (NOTE) The Xpert Xpress SARS-CoV-2/FLU/RSV plus assay is intended as an aid in the diagnosis of influenza from Nasopharyngeal swab specimens and should not be used as a sole basis for treatment. Nasal washings and aspirates are unacceptable for Xpert Xpress SARS-CoV-2/FLU/RSV testing.  Fact Sheet for Patients: EntrepreneurPulse.com.au  Fact Sheet for Healthcare Providers: IncredibleEmployment.be  This test is not yet approved or cleared by the [place] FDA and has been authorized for detection and/or diagnosis of SARS-CoV-2  by FDA under an Emergency Use Authorization (EUA). This EUA will remain in effect (meaning this test can be used) for the duration of the COVID-19 declaration under Section 564(b)(1) of the Act, 21 U.S.C. section 360bbb-3(b)(1), unless the authorization is terminated or revoked.  Performed at [facility], [place] [address], [place], [place] [address]   Blood Culture (routine x 2)     Status: None   Collection Time: 06/13/21  8:50 PM   Specimen: BLOOD RIGHT HAND  Result Value Ref Range Status   Specimen Description BLOOD RIGHT HAND  Final   Special Requests   Final    BOTTLES DRAWN AEROBIC AND ANAEROBIC Blood Culture adequate volume   Culture   Final    NO GROWTH 5 DAYS Performed at [facility], [place] [address] [place], [place] [address]    Report Status  06/18/2021 FINAL  Final  Urine Culture     Status: Abnormal   Collection Time: 06/14/21 12:20 PM   Specimen: In/Out Cath Urine  Result Value Ref Range Status   Specimen Description IN/OUT CATH URINE  Final   Special Requests   Final    NONE Performed at [facility], [place] [address], [place], [place] [address]    Culture >=100,000 COLONIES/mL ENTEROCOCCUS FAECALIS (A)  Final   Report Status 06/17/2021 FINAL  Final   Organism ID, Bacteria ENTEROCOCCUS FAECALIS (A)  Final      Susceptibility   Enterococcus faecalis - MIC*    AMPICILLIN <=2 SENSITIVE Sensitive     NITROFURANTOIN <=16 SENSITIVE Sensitive     VANCOMYCIN 1 SENSITIVE Sensitive     * >=100,000 COLONIES/mL ENTEROCOCCUS FAECALIS  Aerobic/Anaerobic Culture w Gram Stain (surgical/deep wound)     Status: None   Collection Time: 06/14/21  8:14 PM   Specimen: PATH GI Other; Tissue  Result Value Ref Range Status   Specimen Description WOUND PENIS  Final   Special Requests DISCHARGE  Final   Gram Stain   Final    NO ORGANISMS SEEN SQUAMOUS EPITHELIAL CELLS PRESENT ABUNDANT WBC PRESENT,BOTH PMN AND MONONUCLEAR MODERATE GRAM POSITIVE COCCI    Culture    Final    FEW ENTEROCOCCUS FAECALIS FEW GROUP B STREP(S.AGALACTIAE)ISOLATED TESTING AGAINST S. AGALACTIAE NOT ROUTINELY PERFORMED DUE TO PREDICTABILITY OF AMP/PEN/VAN SUSCEPTIBILITY. NO ANAEROBES ISOLATED Performed at [facility], [place] [address] [place], [place] [address]    Report Status 06/20/2021 FINAL  Final   Organism ID, Bacteria ENTEROCOCCUS FAECALIS  Final      Susceptibility   Enterococcus faecalis - MIC*    AMPICILLIN <=2 SENSITIVE Sensitive     VANCOMYCIN 1 SENSITIVE Sensitive     GENTAMICIN SYNERGY SENSITIVE Sensitive     * FEW ENTEROCOCCUS FAECALIS  Fungus Culture With Stain     Status: None (Preliminary result)   Collection Time: 06/14/21  8:14 PM  Result Value Ref Range Status   Fungus Stain Final report  Final    Comment: (NOTE) Performed At: [facility] [address] [place], [place] [id number] Rush Farmer MD YB:[id number]    Fungus (Mycology) Culture PENDING  Incomplete   Fungal Source WOUND  Final    Comment: PENIS DISHARGE Performed at [facility], [place] [address] [place], [place] [address]   Fungus Culture Result     Status: None   Collection Time: 06/14/21  8:14 PM  Result Value Ref Range Status   Result 1 Comment  Final    Comment: (NOTE) KOH/Calcofluor preparation:  no fungus observed. Performed At: [facility] [place], [place] [id number] Rush Farmer MD RX:[id number]   Surgical PCR screen     Status: Abnormal   Collection Time: 06/14/21 11:20 PM   Specimen: Nasal Mucosa; Nasal Swab  Result Value Ref Range Status   MRSA, PCR POSITIVE (A) NEGATIVE Final    Comment: RESULT CALLED TO, READ BACK BY AND VERIFIED WITH: RN CAREY D. 06/15/21@1 :09 BY TW    Staphylococcus aureus POSITIVE (A) NEGATIVE Final    Comment: (NOTE) The Xpert SA Assay (FDA approved for NASAL specimens in patients 81 years of age and older), is one component of a comprehensive surveillance program. It is not intended to  diagnose infection nor to guide or monitor treatment. Performed at [facility], [place] [address], [place],  [address]  Radiology Studies: PERIPHERAL VASCULAR CATHETERIZATION  Result Date: 06/20/2021 Images from the original result were not included. No significant Ileofemoral disease Patent Lt SFA stents 2/3 vessel below the knee runoff to the foot Proximally occluded Lt AT Nigel Mormon, MD Pager: [phone] Office: [phone]   Scheduled Meds:  amLODipine  5 mg Oral Daily   atorvastatin  40 mg Oral QHS   carvedilol  37.5 mg Oral BID WC   Chlorhexidine Gluconate Cloth  6 each Topical Q0600   darbepoetin (ARANESP) injection - DIALYSIS  100 mcg Intravenous Q Tue-HD   fentaNYL  1 patch Transdermal Q72H   insulin aspart  0-9 Units Subcutaneous Q4H   sodium chloride flush  3 mL Intravenous Q12H   sodium chloride flush  3 mL Intravenous Q12H   sodium chloride flush  3 mL Intravenous Q12H   Continuous Infusions:  sodium chloride     sodium chloride     ceFEPime (MAXIPIME) IV 2 g (06/20/21 1653)   dextrose 5% lactated ringers Stopped (06/20/21 0525)   heparin 1,800 Units/hr (06/20/21 1751)   metronidazole 500 mg (06/21/21 0121)   vancomycin 1,000 mg (06/21/21 0000)     LOS: 7 days   Time spent: 35 minutes.   Little Ishikawa, DO [facility]   If 7PM-7AM, please contact night-coverage [email or web address]  06/21/2021, 7:35 AM

## 2021-06-21 NOTE — Progress Notes (Signed)
ANTICOAGULATION CONSULT NOTE  Pharmacy Consult for heparin Indication: atrial fibrillation  Allergies  Allergen Reactions   Contrast Media [Iodinated Diagnostic Agents]     Difficulty breathing     Ivp Dye [Iodinated Diagnostic Agents] Anaphylaxis and Other (See Comments)    Breathing problems, altered mental state    Adhesive [Tape] Rash    Rash after 3-4 days use    Patient Measurements: Height: 6\' 2"  (188 cm) Weight: 112 kg (246 lb 14.6 oz) IBW/kg (Calculated) : 82.2 Heparin Dosing Weight: 105kg  Vital Signs: Temp: 99 F (37.2 C) (10/12 0345) Temp Source: Oral (10/12 0345) BP: 160/75 (10/12 0345) Pulse Rate: 97 (10/12 0345)  Labs: Recent Labs    06/18/21 0715 06/18/21 1004 06/19/21 0043 06/19/21 1240 06/20/21 0300 06/21/21 0150  HGB  --    < > 9.8*  --  10.0* 10.5*  HCT  --   --  30.1*  --  29.6* 30.5*  PLT  --   --  215  --  245 237  HEPARINUNFRC  --    < > 0.16* 0.34 0.47 0.49  CREATININE 4.71*  --   --   --  5.75* 3.29*   < > = values in this interval not displayed.     Estimated Creatinine Clearance: 29 mL/min (A) (by C-G formula based on SCr of 3.29 mg/dL (H)).   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   LEFT CAROTID ENDARTERECTOMY   Chronic kidney disease    Complication of anesthesia    BP WENT UP AT [facility] "   COPD (chronic obstructive pulmonary disease) ([place])    pt denies this dx as of 06/01/20 - no inhaler    Diabetes mellitus without complication ([place])    Diverticulitis    Diverticulosis of colon (without mention of hemorrhage)    DJD (degenerative joint disease)    knees/hands/feet/back/neck   Fatty liver    Full dentures    GERD (gastroesophageal reflux disease)    H/O hiatal hernia    History of blood transfusion    with a past surical procedure per patient 06/01/20   Hyperlipidemia    Hypertension    Neuromuscular disorder ([place])    peripheral neuropathy   Non-pressure chronic  ulcer of other part of left foot limited to breakdown of skin ([place]) 11/12/2016   Osteomyelitis ([facility])    left 5th metatarsal   PAD (peripheral artery disease) ([place])    Distal aortogram June 2012. Atherectomy left popliteal artery July 2012.    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  He is s/p procedure on 105 and has L foot osteomyelitis and plans for possible procedures. He is noted with ESRD on HD. -hg= 9.6  10/12 AM update:  Heparin level remains therapeutic   Goal of Therapy:  Heparin level 0.3-0.7 units/ml Monitor platelets by anticoagulation protocol: Yes   Plan:  -Cont heparin 1800 units/hr -Daily CBC/heparin level -Monitor for bleeding -F/U ortho consult  [name], PharmD, BCPS Clinical Pharmacist Phone: [phone]

## 2021-06-21 NOTE — Progress Notes (Signed)
[facility] Progress Note  Subjective: seen in room, tolerated HD last night, net uf 2.5L. no new complaints today, no acute events overnight  Vitals:   06/21/21 0005 06/21/21 0345 06/21/21 0450 06/21/21 0700  BP: (!) 160/92 (!) 160/75 (!) 126/57 (!) 130/58  Pulse: 90 97  90  Resp: 16 20  20   Temp: 98.4 F (36.9 C) 99 F (37.2 C)  99.1 F (37.3 C)  TempSrc: Oral Oral  Oral  SpO2: 99% 96%  97%  Weight: 112 kg     Height:        Exam: Gen: NAD CV: s1s2, rrr Resp: cta bl Abd: soft Ext: rt bka, lt foot w/ erythema and ulcers Neuro: awake, alert HD access: LUE AVF +b/t  Recent Labs  Lab 06/17/21 0836 06/18/21 0058 06/20/21 0300 06/21/21 0150  K 3.0*   < > 2.9* 3.9  BUN 43*   < > 36* 16  CREATININE 5.12*   < > 5.75* 3.29*  CALCIUM 7.7*   < > 7.9* 8.0*  PHOS 5.8*  --   --  3.7  HGB  --    < > 10.0* 10.5*   < > = values in this interval not displayed.   Inpatient medications:  amLODipine  5 mg Oral Daily   atorvastatin  40 mg Oral QHS   carvedilol  37.5 mg Oral BID WC   Chlorhexidine Gluconate Cloth  6 each Topical Q0600   darbepoetin (ARANESP) injection - DIALYSIS  100 mcg Intravenous Q Tue-HD   fentaNYL  1 patch Transdermal Q72H   insulin aspart  0-9 Units Subcutaneous Q4H   sodium chloride flush  3 mL Intravenous Q12H   sodium chloride flush  3 mL Intravenous Q12H   sodium chloride flush  3 mL Intravenous Q12H    sodium chloride     sodium chloride     ceFEPime (MAXIPIME) IV 2 g (06/20/21 1653)   dextrose 5% lactated ringers Stopped (06/20/21 0525)   heparin 1,800 Units/hr (06/20/21 1751)   metronidazole 500 mg (06/21/21 0121)   vancomycin 1,000 mg (06/21/21 0000)   sodium chloride, sodium chloride, acetaminophen, hydrALAZINE, HYDROmorphone (DILAUDID) injection, labetalol, ondansetron **OR** ondansetron (ZOFRAN) IV, sodium chloride flush, sodium chloride flush     OP HD: [facility]   4h 71min  450/800  108kg  2/2 bath  AVF  Hep none  -  Mircera 154mcg IV q 2 wks (last 9/27) - Hectoral 65mcg IV q HD   Assessment/Plan:  L foot infection+ penile prosthesis infection: prosthesis removed by urology on 10/5. 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  Urine cx growing enterococcus faecalis.  Seen by Dr [name] for PAD. Arteriogram 10/12. IV abx per urology/ pmd.  ESRD: HD TTS. Next HD 10/12  HTN/volume: BP's stable, UF as tolerated  Anemia: Hgb 10.5, receiving esa  Metabolic bone disease: Ca ok, Phos pending. Continue home binders  Nutrition:  Alb low,push protein  T2DM-per primary PAD: agram 10/11, awaiting cardio's recommendations. ortho on board, possible amputation but patient may opt for hospice instead-pall care on board  Gean Quint, MD [place]

## 2021-06-21 NOTE — Progress Notes (Signed)
Angiogram reviewed, discussed with [name].  [name] has spoken to [name].  Patient has a nonhealing ulcer on left heel.  He has an occluded left AT.  His ulcer is not inline flow with left AT, but potentially could have improvement in pedal arch circulation but AT revascularization. I explained to the patient and his wife that successful revascularization of the AT would not treat infection, but could limit tissue loss and minimize the degree of amputation.   I will obtain lower extremity duplex to see if there is any hibernating AT vessels that can be accessed in a retrograde manner further revascularization.  We will plan on performing this on Friday, 06/23/2021 at 7:30 AM.  Risks, benefits, alternate options, including but not limited to emergency surgery for acute limb ischemia, compartment syndrome, dissection, perforation, death discussed with the patient.  Patient and wife understand and would like to proceed with revascularization attempt.  NPO after 10/13 midnight.   Harold Mormon, MD Pager: [phone] Office: [phone]

## 2021-06-21 NOTE — Progress Notes (Signed)
[name] is afebrile without complaints.   JP's removed.  .BP (!) 130/58 (BP Location: Right Arm)   Pulse 90   Temp 99.1 F (37.3 C) (Oral)   Resp 20   Ht 6\' 2"  (1.88 m)   Wt 112 kg   SpO2 97%   BMI 31.70 kg/m   Surgical wound intact without erythema or drainage.  Mild penoscrotal edema.   Foley draining clear urine.  Imp: S/p removal of infected IPP.   Clinically improving.  Plan: I will sign off.  He will have f/u arranged in the office for next week for staple removal.    Patient ID: [name], male   DOB: 17-Oct-1955, 65 y.o.   MRN: [id number]

## 2021-06-21 NOTE — Telephone Encounter (Signed)
Error

## 2021-06-22 ENCOUNTER — Inpatient Hospital Stay (HOSPITAL_COMMUNITY): Payer: 59

## 2021-06-22 ENCOUNTER — Telehealth: Payer: Self-pay | Admitting: Orthopedic Surgery

## 2021-06-22 DIAGNOSIS — E872 Acidosis, unspecified: Secondary | ICD-10-CM | POA: Diagnosis not present

## 2021-06-22 DIAGNOSIS — T8361XA Infection and inflammatory reaction due to implanted penile prosthesis, initial encounter: Secondary | ICD-10-CM

## 2021-06-22 DIAGNOSIS — R6520 Severe sepsis without septic shock: Secondary | ICD-10-CM | POA: Diagnosis not present

## 2021-06-22 DIAGNOSIS — A419 Sepsis, unspecified organism: Secondary | ICD-10-CM | POA: Diagnosis not present

## 2021-06-22 LAB — GLUCOSE, CAPILLARY
Glucose-Capillary: 139 mg/dL — ABNORMAL HIGH (ref 70–99)
Glucose-Capillary: 149 mg/dL — ABNORMAL HIGH (ref 70–99)
Glucose-Capillary: 182 mg/dL — ABNORMAL HIGH (ref 70–99)
Glucose-Capillary: 196 mg/dL — ABNORMAL HIGH (ref 70–99)
Glucose-Capillary: 219 mg/dL — ABNORMAL HIGH (ref 70–99)
Glucose-Capillary: 255 mg/dL — ABNORMAL HIGH (ref 70–99)
Glucose-Capillary: 273 mg/dL — ABNORMAL HIGH (ref 70–99)
Glucose-Capillary: 275 mg/dL — ABNORMAL HIGH (ref 70–99)

## 2021-06-22 LAB — CBC
HCT: 30.6 % — ABNORMAL LOW (ref 39.0–52.0)
Hemoglobin: 9.9 g/dL — ABNORMAL LOW (ref 13.0–17.0)
MCH: 30 pg (ref 26.0–34.0)
MCHC: 32.4 g/dL (ref 30.0–36.0)
MCV: 92.7 fL (ref 80.0–100.0)
Platelets: 237 10*3/uL (ref 150–400)
RBC: 3.3 MIL/uL — ABNORMAL LOW (ref 4.22–5.81)
RDW: 14.1 % (ref 11.5–15.5)
WBC: 8.3 10*3/uL (ref 4.0–10.5)
nRBC: 0 % (ref 0.0–0.2)

## 2021-06-22 LAB — URINE CULTURE: Culture: NO GROWTH

## 2021-06-22 LAB — VANCOMYCIN, RANDOM: Vancomycin Rm: 18

## 2021-06-22 LAB — BASIC METABOLIC PANEL
Anion gap: 12 (ref 5–15)
BUN: 31 mg/dL — ABNORMAL HIGH (ref 8–23)
CO2: 25 mmol/L (ref 22–32)
Calcium: 8.3 mg/dL — ABNORMAL LOW (ref 8.9–10.3)
Chloride: 98 mmol/L (ref 98–111)
Creatinine, Ser: 4.71 mg/dL — ABNORMAL HIGH (ref 0.61–1.24)
GFR, Estimated: 13 mL/min — ABNORMAL LOW (ref >60)
Glucose, Bld: 199 mg/dL — ABNORMAL HIGH (ref 70–99)
Potassium: 3.8 mmol/L (ref 3.5–5.1)
Sodium: 135 mmol/L (ref 135–145)

## 2021-06-22 LAB — HEPARIN LEVEL (UNFRACTIONATED): Heparin Unfractionated: 0.26 IU/mL — ABNORMAL LOW (ref 0.30–0.70)

## 2021-06-22 MED ORDER — COLLAGENASE 250 UNIT/GM EX OINT
TOPICAL_OINTMENT | Freq: Every day | CUTANEOUS | Status: DC
  Filled 2021-06-22: qty 30

## 2021-06-22 MED ORDER — HYDROCODONE-ACETAMINOPHEN 5-325 MG PO TABS
1.0000 | ORAL_TABLET | ORAL | Status: DC | PRN
  Administered 2021-06-22 – 2021-06-26 (×7): 2 via ORAL
  Administered 2021-06-26: 1 via ORAL
  Administered 2021-06-26 – 2021-06-28 (×5): 2 via ORAL
  Administered 2021-06-28: 1 via ORAL
  Administered 2021-06-28 – 2021-06-30 (×7): 2 via ORAL
  Filled 2021-06-22 (×14): qty 2
  Filled 2021-06-22: qty 1
  Filled 2021-06-22 (×7): qty 2

## 2021-06-22 MED ORDER — HYDROMORPHONE HCL 1 MG/ML IJ SOLN
1.0000 mg | INTRAMUSCULAR | Status: DC | PRN
  Administered 2021-06-22 – 2021-06-25 (×12): 1 mg via INTRAVENOUS
  Filled 2021-06-22 (×12): qty 1

## 2021-06-22 NOTE — Progress Notes (Signed)
VASCULAR LAB    Left lower extremity arterial duplex has been performed.  See CV proc for preliminary results.   Kristyn Obyrne, RVT 06/22/2021, 1:30 PM

## 2021-06-22 NOTE — Progress Notes (Signed)
PT Cancellation Note  Patient Details Name: [name] MRN: [id number] DOB: Aug 05, 1956   Cancelled Treatment:    Reason Eval/Treat Not Completed: Patient declined, no reason specified. Pt reports he would prefer to participate in therapy after HD today. Refused all attempts and offers for mobility this morning stating he will 100% participate after HD. Will continue to follow and attempt to progress established POC as pt agreeable.   West Carbo, PT, DPT   Acute Rehabilitation Department Pager #: [phone]   Sandra Cockayne 06/22/2021, 12:38 PM

## 2021-06-22 NOTE — Progress Notes (Signed)
[facility] Progress Note  Subjective: seen in room, did not feel with SBP in 120 last night. Feels better now. No complaints.  Vitals:   06/22/21 0315 06/22/21 0400 06/22/21 0502 06/22/21 0817  BP: (!) 174/72 (!) 129/53 (!) 158/81 (!) 162/86  Pulse: 76   85  Resp: 19   15  Temp: 98.6 F (37 C)   98.4 F (36.9 C)  TempSrc: Oral   Other (Comment)  SpO2: 94%   94%  Weight: 115.4 kg     Height:        Exam: Gen: NAD CV: s1s2, rrr Resp: cta bl Abd: soft Ext: rt bka, lt foot w/ erythema and ulcers Neuro: awake, alert HD access: LUE AVF +b/t  Recent Labs  Lab 06/17/21 0836 06/18/21 0058 06/21/21 0150 06/22/21 0209  K 3.0*   < > 3.9 3.8  BUN 43*   < > 16 31*  CREATININE 5.12*   < > 3.29* 4.71*  CALCIUM 7.7*   < > 8.0* 8.3*  PHOS 5.8*  --  3.7  --   HGB  --    < > 10.5* 9.9*   < > = values in this interval not displayed.   Inpatient medications:  amLODipine  5 mg Oral Daily   atorvastatin  40 mg Oral QHS   carvedilol  37.5 mg Oral BID WC   Chlorhexidine Gluconate Cloth  6 each Topical Q0600   darbepoetin (ARANESP) injection - DIALYSIS  100 mcg Intravenous Q Tue-HD   fentaNYL  1 patch Transdermal Q72H   insulin aspart  0-9 Units Subcutaneous Q4H   oxybutynin  5 mg Oral TID   sodium chloride flush  3 mL Intravenous Q12H   sodium chloride flush  3 mL Intravenous Q12H   sodium chloride flush  3 mL Intravenous Q12H    sodium chloride     sodium chloride     ceFEPime (MAXIPIME) IV 2 g (06/20/21 1653)   dextrose 5% lactated ringers Stopped (06/20/21 0525)   heparin 1,900 Units/hr (06/22/21 0822)   vancomycin 1,000 mg (06/21/21 0000)   sodium chloride, sodium chloride, acetaminophen, hydrALAZINE, HYDROmorphone (DILAUDID) injection, labetalol, ondansetron **OR** ondansetron (ZOFRAN) IV, sodium chloride flush, sodium chloride flush     OP HD: [facility]   4h 39min  450/800  108kg  2/2 bath  AVF  Hep none  - Mircera 15mcg IV q 2 wks (last 9/27) -  Hectoral 98mcg IV q HD   Assessment/Plan:  L foot infection+ penile prosthesis infection: prosthesis removed by urology on 10/5. 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  Urine cx growing enterococcus faecalis.  Seen by Dr [name] for PAD. Arteriogram 10/12. IV abx per urology/ pmd.  ESRD: HD TTS. Next HD today  HTN/volume: BP's stable, UF as tolerated  Anemia: Hgb 9.9, receiving esa  Metabolic bone disease: Ca ok, Phos wnl 10/12. Continue home binders  Nutrition:  Alb low,push protein  T2DM-per primary PAD: agram 10/11, revascularization tomorrow w/ cardiology  Gean Quint, MD [facility]

## 2021-06-22 NOTE — Progress Notes (Signed)
Inpatient Diabetes Program Recommendations  AACE/ADA: New Consensus Statement on Inpatient Glycemic Control (2015)  Target Ranges:  Prepandial:   less than 140 mg/dL      Peak postprandial:   less than 180 mg/dL (1-2 hours)      Critically ill patients:  140 - 180 mg/dL  Results for [name], [name] (MRN [id number]) as of 06/22/2021 09:48  Ref. Range 06/21/2021 03:50 06/21/2021 07:19 06/21/2021 12:36 06/21/2021 15:36 06/21/2021 19:45  Glucose-Capillary Latest Ref Range: 70 - 99 mg/dL 213 (H) 203 (H) 261 (H) 332 (H) 267 (H)  Results for [name], [name] (MRN [id number]) as of 06/22/2021 09:48  Ref. Range 06/22/2021 00:20 06/22/2021 04:17 06/22/2021 08:26  Glucose-Capillary Latest Ref Range: 70 - 99 mg/dL 196 (H) 255 (H) 182 (H)     Home DM Meds: Humalog 13 units TID        Lantus 55 units QHS  Current Orders: Novolog Sensitive Correction Scale/ SSI (0-9 units) Q4 hours     MD- Note Hypoglycemia on 10/11 after getting Semglee 50 units the night prior.  CBGs now elevated  Please consider restarting Semglee at 25% home dose to start:  Semglee 15 units QHS    --Will follow patient during hospitalization--  Wyn Quaker RN, MSN, CDE Diabetes Coordinator Inpatient Glycemic Control Team Team Pager: [phone] (8a-5p)

## 2021-06-22 NOTE — Telephone Encounter (Signed)
I faxed [employer] round the addendum for the 04/03/21 note. This has been adjusted numerous times and asked what else are they missing?

## 2021-06-22 NOTE — Progress Notes (Signed)
OT Cancellation Note  Patient Details Name: [name] MRN: [id number] DOB: 25-May-1956   Cancelled Treatment:    Reason Eval/Treat Not Completed: Patient declined, no reason specified Hopeful for OOB transfers, seen in conjunction with PT. However, pt declines due to "feeling like Im about to pass out", reports BP has been low. First BP 120/76 - pt reports 120s is "way too low" for him. Second reading 140/76 and pt reports may feel better in a few minutes. Attempted to engage in EOB attempts though pt adamantly declines, citing BP, feeling too weak and requesting therapists to come back this evening after dialysis as he will have more energy to participate at that time. Will re-attempt tomorrow if schedule permits.   Layla Maw 06/22/2021, 11:02 AM

## 2021-06-22 NOTE — Telephone Encounter (Signed)
[employer] power wheel chairs is calling to get an update for where you are at with pts paperwork.   CB [id number]  REF # [id number]

## 2021-06-22 NOTE — Progress Notes (Signed)
Pharmacy Antibiotic Note  [name] is a 65 y.o. male continues on broad spectrum antibiotics for non-healing ulcer of left heal starting 10/4.  Vascular workup underway. With ESRD - HD on T,Th,Sat Vancomycin random level 18 this AM - therapeutic Blood cultures negative Penile wound culture with few enterococcus  Plan: Continue Vancomycin 1 gram iv Q HD Cefepime 2 grams iv Q HD Follow vascular plan / LOT for antibiotics  Continues on heparin while apixaban on hold - rate up to 1900 units/hr today  Height: 6\' 2"  (188 cm) Weight: 115.4 kg (254 lb 6.6 oz) IBW/kg (Calculated) : 82.2  Temp (24hrs), Avg:98.8 F (37.1 C), Min:98.5 F (36.9 C), Max:99 F (37.2 C)  Recent Labs  Lab 06/17/21 0836 06/18/21 0058 06/18/21 0715 06/19/21 0043 06/20/21 0300 06/21/21 0150 06/22/21 0209  WBC  --  6.7  --  7.6 6.5 7.3 8.3  CREATININE 5.12*  --  4.71*  --  5.75* 3.29* 4.71*  VANCORANDOM  --   --   --   --   --   --  18    Estimated Creatinine Clearance: 20.6 mL/min (A) (by C-G formula based on SCr of 4.71 mg/dL (H)).    Allergies  Allergen Reactions   Contrast Media [Iodinated Diagnostic Agents]     Difficulty breathing     Ivp Dye [Iodinated Diagnostic Agents] Anaphylaxis and Other (See Comments)    Breathing problems, altered mental state    Adhesive [Tape] Rash    Rash after 3-4 days use    Thank you  Anette Guarneri, PharmD  06/22/2021 7:56 AM

## 2021-06-22 NOTE — Consult Note (Signed)
WOC Nurse Consult Note: Reason for Consult:Unstageable pressure injury to left gluteal fold.  Patient admitted for sepsis related to infected penile prosthesis and osteomyelitis.  Prosthesis removed and Dr [name] following for wounds.  History PVD and revascularization planned for tomorrow.  Having hemodialysis today.  Wound type:pressure Pressure Injury POA: No Measurement: 2 cm x 2.2 cm wound bed slough Wound bed: 75% slough  25% nongranulating, pale pink tissue Drainage (amount, consistency, odor) minimal serosanguinous  musty odor.  Periwound: Dry skin.  Dressing procedure/placement/frequency: Cleanse wound left buttocks with NS and pat dry.  Apply Santyl to necrotic tissue. Cover with NS moist gauze.  Secure with foam dressing.  Peel back foam and replace santyl and gauze daily. Replace foam every three days. Please date foam dressing.  Will not follow at this time.  Please re-consult if needed.  Domenic Moras MSN, RN, FNP-BC CWON Wound, Ostomy, Continence Nurse Pager [phone]

## 2021-06-22 NOTE — Progress Notes (Signed)
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 and [name], paroxysmal A. fib on Eliquis, ESRD on hemodialysis TTS, chronic diastolic heart failure, diabetes type 2, hypertension, dyslipidemia who was sent from dialysis center to the ED for further evaluation of wound infection.  Patient is followed by [name] for chronic left foot wound infection with history of PAD, at follow-up on 10/3 he discussed potential need for transtibial amputation, also followed by [name] due to peripheral vascular disease and plan for angiogram.  Patient was noticed to have increasing swelling and pain around the penis and the scrotum, noted purulent discharge from the urethra and drainage of purulent secretion from  penile prothesis.  In the ED he was found to have leukocytosis white count 21,000, lactic acidosis. He was evaluated by [name] and underwent removal of 3 piece inflatable penile prosthesis on 06/14/2021.   Assessment & Plan:   Severe sepsis, secondary to infected penile prosthesis,  -He underwent removal of 3 piece inflatable penile infected prosthesis -Wound culture: Enterococcus and Streptococcus lactam -Urine culture grew Enterococcus fecaelis - repeat per urology -On cefepime and vancomycin currently; fluconazole discontinued previously -JP drains removed; Will need foley catheter for another week per urology -issues noted today at bedside with Foley leakage, urology recommending increasing oxybutynin for spasm likely causing previous leakage -Urology recommend 1 week of antibiotics.   Left foot infection, abnormal signal abnormalities of  cuboid worrisome for osteomyelitis, cellulitis and mild fasciitis -History of  peripheral arterial disease, right BKA -Consulted with [name] and [name]. -Underwent  arteriogram on 10/11: No significant iliofemoral disease.  Patent left SFA stents. 2/3 vessel below the knee runoff to the foot.  Proximal occluded left AT. Awaiting final cardiology recommendation -possible revascularization attempt pending family decision in the next few days.,  Tentative plan for revascularization 06/23/2021 at 7:30 AM if family is agreeable pending further work-up with cardiology. -Continue with IV antibiotics.  ESRD on hemodialysis: Nephrology following  Paroxysmal A. fib:  Continue with Coreg Continue with heparin drip.  Holding Eliquis in anticipation of arteriogram next week  Diabetes type 2:  Sliding scale insulin Hypoglycemia.  Withhold long-acting insulin.  He report poor oral intake.  CAD status post PCI:  Continue with Coreg and atorvastatin Hypertension: Continue with carvedilol, Norvasc.  Chronic pain; on fentanyl patch.  Hyponatremia, hypokalemia: Correction with hemodialysis  Obesity Estimated body mass index is 32.66 kg/m as calculated from the following:   Height as of this encounter: 6\' 2"  (1.88 m).   Weight as of this encounter: 115.4 kg.   DVT prophylaxis: SCD Code Status: Full code Family Communication: No family at bedside, no answer via telephone  Disposition Plan:  Status is: Inpatient  Remains inpatient appropriate because:Inpatient level of care appropriate due to severity of illness  Dispo: The patient is from: Home              Anticipated d/c is to: SNF              Patient currently is not medically stable to d/c.   Difficult to place patient No  Consultants:  Dr [name] urology Nephrology, Dr. Candiss Norse Dr [name] orthopedic surgery Dr [name].   Procedures:  Removal of 3  pieces inflatable penile prothesis  that were infected Tentative plan for left foot revascularization 06/23/2021  Antimicrobials:  Cefepime 10/4  Vancomycin  10/4 Fluconazole. 10/5  Subjective: No acute issues or events overnight denies nausea vomiting diarrhea constipation headache fevers chills or chest pain, looking forward to procedure, given goal is to salvage the foot and improve infection he realizes that this is the most appropriate next step  Objective: Vitals:   06/21/21 2325 06/22/21 0315 06/22/21 0400 06/22/21 0502  BP: (!) 158/75 (!) 174/72 (!) 129/53 (!) 158/81  Pulse: 79 76    Resp: 14 19    Temp: 98.9 F (37.2 C) 98.6 F (37 C)    TempSrc: Oral Oral    SpO2: 96% 94%    Weight:  115.4 kg    Height:        Intake/Output Summary (Last 24 hours) at 06/22/2021 0802 Last data filed at 06/21/2021 1835 Gross per 24 hour  Intake 1172.19 ml  Output 700 ml  Net 472.19 ml    Filed Weights   06/20/21 2046 06/21/21 0005 06/22/21 0315  Weight: 114.5 kg 112 kg 115.4 kg    Examination:  General exam: NAD Respiratory system: CTA Cardiovascular system:  S 1, S 2 RRR Gastrointestinal system: BS present,, Soft, nt Central nervous system:Alert,.  Extremities: right BKA, left foot with edema, dressing in place.   Data Reviewed: I have personally reviewed following labs and imaging studies  CBC: Recent Labs  Lab 06/18/21 0058 06/19/21 0043 06/20/21 0300 06/21/21 0150 06/22/21 0209  WBC 6.7 7.6 6.5 7.3 8.3  HGB 9.7* 9.8* 10.0* 10.5* 9.9*  HCT 29.5* 30.1* 29.6* 30.5* 30.6*  MCV 90.8 91.5 89.2 89.2 92.7  PLT 211 215 245 237 616    Basic Metabolic Panel: Recent Labs  Lab 06/17/21 0836 06/18/21 0715 06/20/21 0300 06/21/21 0150 06/22/21 0209  NA 133* 133* 136 131* 135  K 3.0* 3.3* 2.9* 3.9 3.8  CL 97* 95* 101 97* 98  CO2 25 25 23 25 25   GLUCOSE 112* 217* 87 216* 199*  BUN 43* 32* 36* 16 31*  CREATININE 5.12* 4.71* 5.75* 3.29* 4.71*  CALCIUM 7.7* 7.8* 7.9* 8.0* 8.3*  PHOS 5.8*  --   --  3.7  --     GFR: Estimated Creatinine Clearance: 20.6 mL/min (A) (by C-G formula based on SCr of 4.71 mg/dL (H)). Liver  Function Tests: Recent Labs  Lab 06/17/21 0836 06/21/21 0150  ALBUMIN 2.0* 2.5*    No results for input(s): LIPASE, AMYLASE in the last 168 hours. No results for input(s): AMMONIA in the last 168 hours. Coagulation Profile: No results for input(s): INR, PROTIME in the last 168 hours.  Cardiac Enzymes: No results for input(s): CKTOTAL, CKMB, CKMBINDEX, TROPONINI in the last 168 hours. BNP (last 3 results) No results for input(s): PROBNP in the last 8760 hours. HbA1C: No results for input(s): HGBA1C in the last 72 hours.  CBG: Recent Labs  Lab 06/21/21 1236 06/21/21 1536 06/21/21 1945 06/22/21 0020 06/22/21 0417  GLUCAP 261* 332* 267* 196* 255*    Lipid Profile: No results for input(s): CHOL, HDL, LDLCALC, TRIG, CHOLHDL, LDLDIRECT in the last 72 hours. Thyroid Function Tests: No results for input(s): TSH, T4TOTAL, FREET4, T3FREE, THYROIDAB in the last 72 hours. Anemia Panel: No results for input(s): VITAMINB12, FOLATE, FERRITIN, TIBC, IRON, RETICCTPCT in the last 72 hours. Sepsis Labs: No results for input(s): PROCALCITON, LATICACIDVEN in the last 168 hours.   Recent Results (from the past 240 hour(s))  Blood Culture (routine x 2)     Status: None   Collection Time: 06/13/21  7:40 PM   Specimen: BLOOD  Result Value Ref Range Status   Specimen Description BLOOD RIGHT ANTECUBITAL  Final   Special Requests   Final    BOTTLES DRAWN AEROBIC AND ANAEROBIC Blood Culture results may not be optimal due to an inadequate volume of blood received in culture bottles   Culture   Final    NO GROWTH 5 DAYS Performed at [facility], [place] [address], [place], [place] [address]    Report Status 06/18/2021 FINAL  Final  Resp Panel by RT-PCR (Flu A&B, Covid) Nasopharyngeal Swab     Status: None   Collection Time: 06/13/21  8:17 PM   Specimen: Nasopharyngeal Swab; Nasopharyngeal(NP) swabs in vial transport medium  Result Value Ref Range Status   SARS Coronavirus 2 by RT PCR  NEGATIVE NEGATIVE Final    Comment: (NOTE) SARS-CoV-2 target nucleic acids are NOT DETECTED.  The SARS-CoV-2 RNA is generally detectable in upper respiratory specimens during the acute phase of infection. The lowest concentration of SARS-CoV-2 viral copies this assay can detect is 138 copies/mL. A negative result does not preclude SARS-Cov-2 infection and should not be used as the sole basis for treatment or other patient management decisions. A negative result may occur with  improper specimen collection/handling, submission of specimen other than nasopharyngeal swab, presence of viral mutation(s) within the areas targeted by this assay, and inadequate number of viral copies(<138 copies/mL). A negative result must be combined with clinical observations, patient history, and epidemiological information. The expected result is Negative.  Fact Sheet for Patients:  EntrepreneurPulse.com.au  Fact Sheet for Healthcare Providers:  IncredibleEmployment.be  This test is no t yet approved or cleared by the [place] FDA and  has been authorized for detection and/or diagnosis of SARS-CoV-2 by FDA under an Emergency Use Authorization (EUA). This EUA will remain  in effect (meaning this test can be used) for the duration of the COVID-19 declaration under Section 564(b)(1) of the Act, 21 U.S.C.section 360bbb-3(b)(1), unless the authorization is terminated  or revoked sooner.       Influenza A by PCR NEGATIVE NEGATIVE Final   Influenza B by PCR NEGATIVE NEGATIVE Final    Comment: (NOTE) The Xpert Xpress SARS-CoV-2/FLU/RSV plus assay is intended as an aid in the diagnosis of influenza from Nasopharyngeal swab specimens and should not be used as a sole basis for treatment. Nasal washings and aspirates are unacceptable for Xpert Xpress SARS-CoV-2/FLU/RSV testing.  Fact Sheet for Patients: EntrepreneurPulse.com.au  Fact Sheet for  Healthcare Providers: IncredibleEmployment.be  This test is not yet approved or cleared by the [place] FDA and has been authorized for detection and/or diagnosis of SARS-CoV-2 by FDA under an Emergency Use Authorization (EUA). This EUA will remain in effect (meaning this test can be used) for the duration of the COVID-19 declaration under Section 564(b)(1) of the Act, 21 U.S.C. section 360bbb-3(b)(1), unless the authorization is terminated or revoked.  Performed at [facility], [place] [address], [place], [place] [address]   Blood Culture (routine x 2)     Status: None   Collection Time: 06/13/21  8:50 PM   Specimen: BLOOD RIGHT HAND  Result Value Ref Range Status   Specimen Description BLOOD RIGHT HAND  Final   Special Requests   Final    BOTTLES DRAWN AEROBIC AND ANAEROBIC Blood Culture adequate volume   Culture   Final  NO GROWTH 5 DAYS Performed at [facility], [place] [address] [place], [place] [address]    Report Status 06/18/2021 FINAL  Final  Urine Culture     Status: Abnormal   Collection Time: 06/14/21 12:20 PM   Specimen: In/Out Cath Urine  Result Value Ref Range Status   Specimen Description IN/OUT CATH URINE  Final   Special Requests   Final    NONE Performed at [facility], [place] [address], [place], [place] [address]    Culture >=100,000 COLONIES/mL ENTEROCOCCUS FAECALIS (A)  Final   Report Status 06/17/2021 FINAL  Final   Organism ID, Bacteria ENTEROCOCCUS FAECALIS (A)  Final      Susceptibility   Enterococcus faecalis - MIC*    AMPICILLIN <=2 SENSITIVE Sensitive     NITROFURANTOIN <=16 SENSITIVE Sensitive     VANCOMYCIN 1 SENSITIVE Sensitive     * >=100,000 COLONIES/mL ENTEROCOCCUS FAECALIS  Aerobic/Anaerobic Culture w Gram Stain (surgical/deep wound)     Status: None   Collection Time: 06/14/21  8:14 PM   Specimen: PATH GI Other; Tissue  Result Value Ref Range Status   Specimen Description WOUND PENIS  Final    Special Requests DISCHARGE  Final   Gram Stain   Final    NO ORGANISMS SEEN SQUAMOUS EPITHELIAL CELLS PRESENT ABUNDANT WBC PRESENT,BOTH PMN AND MONONUCLEAR MODERATE GRAM POSITIVE COCCI    Culture   Final    FEW ENTEROCOCCUS FAECALIS FEW GROUP B STREP(S.AGALACTIAE)ISOLATED TESTING AGAINST S. AGALACTIAE NOT ROUTINELY PERFORMED DUE TO PREDICTABILITY OF AMP/PEN/VAN SUSCEPTIBILITY. NO ANAEROBES ISOLATED Performed at [facility], [place] [address] [place], [place] [address]    Report Status 06/20/2021 FINAL  Final   Organism ID, Bacteria ENTEROCOCCUS FAECALIS  Final      Susceptibility   Enterococcus faecalis - MIC*    AMPICILLIN <=2 SENSITIVE Sensitive     VANCOMYCIN 1 SENSITIVE Sensitive     GENTAMICIN SYNERGY SENSITIVE Sensitive     * FEW ENTEROCOCCUS FAECALIS  Fungus Culture With Stain     Status: None (Preliminary result)   Collection Time: 06/14/21  8:14 PM  Result Value Ref Range Status   Fungus Stain Final report  Final    Comment: (NOTE) Performed At: [facility] [address] [place], [place] [id number] Rush [place] MD NI:[id number]    Fungus (Mycology) Culture PENDING  Incomplete   Fungal Source WOUND  Final    Comment: PENIS DISHARGE Performed at [facility], [place] [address], [place], [place] [address]   Fungus Culture Result     Status: None   Collection Time: 06/14/21  8:14 PM  Result Value Ref Range Status   Result 1 Comment  Final    Comment: (NOTE) KOH/Calcofluor preparation:  no fungus observed. Performed At: [facility] [place], [place] [id number] Rush Farmer MD PJ:[id number]   Surgical PCR screen     Status: Abnormal   Collection Time: 06/14/21 11:20 PM   Specimen: Nasal Mucosa; Nasal Swab  Result Value Ref Range Status   MRSA, PCR POSITIVE (A) NEGATIVE Final    Comment: RESULT CALLED TO, READ BACK BY AND VERIFIED WITH: RN CAREY D. 06/15/21@1 :09 BY TW    Staphylococcus aureus POSITIVE (A) NEGATIVE  Final    Comment: (NOTE) The Xpert SA Assay (FDA approved for NASAL specimens in patients 61 years of age and older), is one component of a comprehensive surveillance program. It is not intended to diagnose infection nor to  guide or monitor treatment. Performed at [facility], [place] [address], [place], [place] [address]    Radiology Studies: PERIPHERAL VASCULAR CATHETERIZATION  Result Date: 06/20/2021 Images from the original result were not included. No significant Ileofemoral disease Patent Lt SFA stents 2/3 vessel below the knee runoff to the foot Proximally occluded Lt AT Nigel Mormon, MD Pager: [phone] Office: [phone]   Scheduled Meds:  amLODipine  5 mg Oral Daily   atorvastatin  40 mg Oral QHS   carvedilol  37.5 mg Oral BID WC   Chlorhexidine Gluconate Cloth  6 each Topical Q0600   darbepoetin (ARANESP) injection - DIALYSIS  100 mcg Intravenous Q Tue-HD   fentaNYL  1 patch Transdermal Q72H   insulin aspart  0-9 Units Subcutaneous Q4H   oxybutynin  5 mg Oral TID   sodium chloride flush  3 mL Intravenous Q12H   sodium chloride flush  3 mL Intravenous Q12H   sodium chloride flush  3 mL Intravenous Q12H   Continuous Infusions:  sodium chloride     sodium chloride     ceFEPime (MAXIPIME) IV 2 g (06/20/21 1653)   dextrose 5% lactated ringers Stopped (06/20/21 0525)   heparin 1,800 Units/hr (06/21/21 1259)   vancomycin 1,000 mg (06/21/21 0000)     LOS: 8 days   Time spent: 35 minutes.   Little Ishikawa, DO [facility]   If 7PM-7AM, please contact night-coverage [email or web address]  06/22/2021, 8:02 AM

## 2021-06-22 NOTE — Progress Notes (Signed)
Pt came back to rm 1 from dialysis. Reinitiated tele. Vss. Call bell within reach.   Obtained consent form and placed in pt's chart.   Lavenia Atlas, RN

## 2021-06-23 ENCOUNTER — Inpatient Hospital Stay (HOSPITAL_COMMUNITY)
Admission: EM | Disposition: A | Discharge status: Home Health | Payer: Self-pay | Source: Home / Self Care | Attending: Internal Medicine

## 2021-06-23 DIAGNOSIS — Z7189 Other specified counseling: Secondary | ICD-10-CM | POA: Diagnosis not present

## 2021-06-23 DIAGNOSIS — R6520 Severe sepsis without septic shock: Secondary | ICD-10-CM | POA: Diagnosis not present

## 2021-06-23 DIAGNOSIS — A419 Sepsis, unspecified organism: Secondary | ICD-10-CM | POA: Diagnosis not present

## 2021-06-23 DIAGNOSIS — Z515 Encounter for palliative care: Secondary | ICD-10-CM | POA: Diagnosis not present

## 2021-06-23 DIAGNOSIS — I87332 Chronic venous hypertension (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: Secondary | ICD-10-CM | POA: Diagnosis not present

## 2021-06-23 DIAGNOSIS — E872 Acidosis, unspecified: Secondary | ICD-10-CM | POA: Diagnosis not present

## 2021-06-23 HISTORY — PX: PERIPHERAL VASCULAR INTERVENTION: CATH118257

## 2021-06-23 HISTORY — PX: ABDOMINAL AORTOGRAM W/LOWER EXTREMITY: CATH118223

## 2021-06-23 LAB — CBC
HCT: 28.6 % — ABNORMAL LOW (ref 39.0–52.0)
Hemoglobin: 9.6 g/dL — ABNORMAL LOW (ref 13.0–17.0)
MCH: 30.9 pg (ref 26.0–34.0)
MCHC: 33.6 g/dL (ref 30.0–36.0)
MCV: 92 fL (ref 80.0–100.0)
Platelets: 200 10*3/uL (ref 150–400)
RBC: 3.11 MIL/uL — ABNORMAL LOW (ref 4.22–5.81)
RDW: 14.1 % (ref 11.5–15.5)
WBC: 6.7 10*3/uL (ref 4.0–10.5)
nRBC: 0 % (ref 0.0–0.2)

## 2021-06-23 LAB — BASIC METABOLIC PANEL
Anion gap: 8 (ref 5–15)
Anion gap: 15 (ref 5–15)
BUN: 17 mg/dL (ref 8–23)
BUN: 33 mg/dL — ABNORMAL HIGH (ref 8–23)
CO2: 27 mmol/L (ref 22–32)
CO2: 21 mmol/L — ABNORMAL LOW (ref 22–32)
Calcium: 7.8 mg/dL — ABNORMAL LOW (ref 8.9–10.3)
Calcium: 8 mg/dL — ABNORMAL LOW (ref 8.9–10.3)
Chloride: 100 mmol/L (ref 98–111)
Chloride: 93 mmol/L — ABNORMAL LOW (ref 98–111)
Creatinine, Ser: 3.55 mg/dL — ABNORMAL HIGH (ref 0.61–1.24)
Creatinine, Ser: 4.57 mg/dL — ABNORMAL HIGH (ref 0.61–1.24)
GFR, Estimated: 18 mL/min — ABNORMAL LOW (ref >60)
GFR, Estimated: 13 mL/min — ABNORMAL LOW (ref >60)
Glucose, Bld: 180 mg/dL — ABNORMAL HIGH (ref 70–99)
Glucose, Bld: 545 mg/dL (ref 70–99)
Potassium: 3.7 mmol/L (ref 3.5–5.1)
Potassium: 5 mmol/L (ref 3.5–5.1)
Sodium: 135 mmol/L (ref 135–145)
Sodium: 129 mmol/L — ABNORMAL LOW (ref 135–145)

## 2021-06-23 LAB — HEPARIN LEVEL (UNFRACTIONATED)
Heparin Unfractionated: 0.27 IU/mL — ABNORMAL LOW (ref 0.30–0.70)
Heparin Unfractionated: >1.1 IU/mL — ABNORMAL HIGH (ref 0.30–0.70)

## 2021-06-23 LAB — GLUCOSE, CAPILLARY
Glucose-Capillary: 186 mg/dL — ABNORMAL HIGH (ref 70–99)
Glucose-Capillary: 275 mg/dL — ABNORMAL HIGH (ref 70–99)
Glucose-Capillary: 444 mg/dL — ABNORMAL HIGH (ref 70–99)
Glucose-Capillary: 472 mg/dL — ABNORMAL HIGH (ref 70–99)
Glucose-Capillary: 495 mg/dL — ABNORMAL HIGH (ref 70–99)
Glucose-Capillary: 519 mg/dL (ref 70–99)
Glucose-Capillary: 542 mg/dL (ref 70–99)
Glucose-Capillary: 553 mg/dL (ref 70–99)

## 2021-06-23 LAB — POCT ACTIVATED CLOTTING TIME
Activated Clotting Time: 242 seconds
Activated Clotting Time: 242 seconds

## 2021-06-23 SURGERY — ABDOMINAL AORTOGRAM W/LOWER EXTREMITY
Anesthesia: LOCAL

## 2021-06-23 MED ORDER — NITROGLYCERIN 1 MG/10 ML FOR IR/CATH LAB
INTRA_ARTERIAL | Status: AC
  Filled 2021-06-23: qty 10

## 2021-06-23 MED ORDER — LABETALOL HCL 5 MG/ML IV SOLN: 10.0000 mg | INTRAVENOUS | Status: DC | PRN

## 2021-06-23 MED ORDER — FENTANYL CITRATE (PF) 100 MCG/2ML IJ SOLN
INTRAMUSCULAR | Status: DC | PRN
  Administered 2021-06-23 (×3): 50 ug via INTRAVENOUS
  Administered 2021-06-23: 25 ug via INTRAVENOUS
  Administered 2021-06-23: 50 ug via INTRAVENOUS
  Administered 2021-06-23: 25 ug via INTRAVENOUS
  Administered 2021-06-23: 50 ug via INTRAVENOUS

## 2021-06-23 MED ORDER — VERAPAMIL HCL 2.5 MG/ML IV SOLN
INTRAVENOUS | Status: DC | PRN
  Administered 2021-06-23 (×2): 400 ug via INTRA_ARTERIAL
  Administered 2021-06-23: 200 ug via INTRA_ARTERIAL
  Administered 2021-06-23: 400 ug via INTRA_ARTERIAL

## 2021-06-23 MED ORDER — FENTANYL CITRATE (PF) 100 MCG/2ML IJ SOLN
INTRAMUSCULAR | Status: AC
  Filled 2021-06-23: qty 2

## 2021-06-23 MED ORDER — PROSOURCE PLUS PO LIQD
30.0000 mL | Freq: Two times a day (BID) | ORAL | Status: DC
  Administered 2021-06-23 – 2021-06-30 (×11): 30 mL via ORAL
  Filled 2021-06-23 (×11): qty 30

## 2021-06-23 MED ORDER — DIPHENHYDRAMINE HCL 50 MG/ML IJ SOLN
25.0000 mg | Freq: Once | INTRAMUSCULAR | Status: AC
  Administered 2021-06-23: 25 mg via INTRAVENOUS
  Filled 2021-06-23: qty 1

## 2021-06-23 MED ORDER — MIDAZOLAM HCL 2 MG/2ML IJ SOLN
INTRAMUSCULAR | Status: AC
  Filled 2021-06-23: qty 2

## 2021-06-23 MED ORDER — HYDRALAZINE HCL 20 MG/ML IJ SOLN: 5.0000 mg | INTRAMUSCULAR | Status: DC | PRN

## 2021-06-23 MED ORDER — CLOPIDOGREL BISULFATE 75 MG PO TABS
75.0000 mg | ORAL_TABLET | Freq: Every day | ORAL | Status: DC
  Administered 2021-06-23 – 2021-06-30 (×8): 75 mg via ORAL
  Filled 2021-06-23 (×8): qty 1

## 2021-06-23 MED ORDER — SODIUM CHLORIDE 0.9 % IV SOLN: 250.0000 mL | INTRAVENOUS | Status: DC | PRN

## 2021-06-23 MED ORDER — INSULIN GLARGINE-YFGN 100 UNIT/ML ~~LOC~~ SOLN
15.0000 [IU] | Freq: Two times a day (BID) | SUBCUTANEOUS | Status: DC
  Administered 2021-06-23: 15 [IU] via SUBCUTANEOUS
  Filled 2021-06-23 (×2): qty 0.15

## 2021-06-23 MED ORDER — ACETAMINOPHEN 325 MG PO TABS: 650.0000 mg | ORAL_TABLET | ORAL | Status: DC | PRN

## 2021-06-23 MED ORDER — HEPARIN (PORCINE) 25000 UT/250ML-% IV SOLN
2100.0000 [IU]/h | INTRAVENOUS | Status: DC
  Administered 2021-06-23 – 2021-06-28 (×10): 2100 [IU]/h via INTRAVENOUS
  Filled 2021-06-23 (×8): qty 250

## 2021-06-23 MED ORDER — INSULIN ASPART 100 UNIT/ML IJ SOLN: 0.0000 [IU] | Freq: Every day | INTRAMUSCULAR | Status: DC

## 2021-06-23 MED ORDER — CLOPIDOGREL BISULFATE 300 MG PO TABS
300.0000 mg | ORAL_TABLET | Freq: Once | ORAL | Status: AC
  Administered 2021-06-23: 300 mg via ORAL
  Filled 2021-06-23: qty 1

## 2021-06-23 MED ORDER — HEPARIN (PORCINE) IN NACL 1000-0.9 UT/500ML-% IV SOLN
INTRAVENOUS | Status: AC
  Filled 2021-06-23: qty 1000

## 2021-06-23 MED ORDER — INSULIN ASPART 100 UNIT/ML IJ SOLN
5.0000 [IU] | Freq: Once | INTRAMUSCULAR | Status: AC
  Administered 2021-06-23: 5 [IU] via SUBCUTANEOUS

## 2021-06-23 MED ORDER — SODIUM CHLORIDE 0.9 % IV SOLN: INTRAVENOUS | Status: DC

## 2021-06-23 MED ORDER — ONDANSETRON HCL 4 MG/2ML IJ SOLN: 4.0000 mg | Freq: Four times a day (QID) | INTRAMUSCULAR | Status: DC | PRN

## 2021-06-23 MED ORDER — INSULIN ASPART 100 UNIT/ML IJ SOLN: 15.0000 [IU] | INTRAMUSCULAR | Status: DC

## 2021-06-23 MED ORDER — MIDAZOLAM HCL 2 MG/2ML IJ SOLN
INTRAMUSCULAR | Status: DC | PRN
  Administered 2021-06-23: 2 mg via INTRAVENOUS

## 2021-06-23 MED ORDER — HEPARIN SODIUM (PORCINE) 1000 UNIT/ML IJ SOLN
INTRAMUSCULAR | Status: AC
  Filled 2021-06-23: qty 1

## 2021-06-23 MED ORDER — IODIXANOL 320 MG/ML IV SOLN
INTRAVENOUS | Status: DC | PRN
  Administered 2021-06-23: 120 mL via INTRA_ARTERIAL

## 2021-06-23 MED ORDER — INSULIN ASPART 100 UNIT/ML IJ SOLN: 0.0000 [IU] | Freq: Three times a day (TID) | INTRAMUSCULAR | Status: DC

## 2021-06-23 MED ORDER — SODIUM CHLORIDE 0.9% FLUSH
3.0000 mL | Freq: Two times a day (BID) | INTRAVENOUS | Status: DC
  Administered 2021-06-24 – 2021-06-30 (×8): 3 mL via INTRAVENOUS

## 2021-06-23 MED ORDER — NITROGLYCERIN 1 MG/10 ML FOR IR/CATH LAB
INTRA_ARTERIAL | Status: DC | PRN
  Administered 2021-06-23 (×2): 200 ug via INTRA_ARTERIAL
  Administered 2021-06-23: 400 ug via INTRA_ARTERIAL

## 2021-06-23 MED ORDER — LIDOCAINE HCL (PF) 1 % IJ SOLN
INTRAMUSCULAR | Status: AC
  Filled 2021-06-23: qty 30

## 2021-06-23 MED ORDER — ASPIRIN 81 MG PO CHEW
81.0000 mg | CHEWABLE_TABLET | Freq: Once | ORAL | Status: AC
  Administered 2021-06-23: 81 mg via ORAL
  Filled 2021-06-23: qty 1

## 2021-06-23 MED ORDER — METHYLPREDNISOLONE SODIUM SUCC 125 MG IJ SOLR
125.0000 mg | Freq: Once | INTRAMUSCULAR | Status: AC
  Administered 2021-06-23: 125 mg via INTRAVENOUS
  Filled 2021-06-23: qty 2

## 2021-06-23 MED ORDER — INSULIN GLARGINE-YFGN 100 UNIT/ML ~~LOC~~ SOLN
15.0000 [IU] | Freq: Every day | SUBCUTANEOUS | Status: DC
  Administered 2021-06-23: 15 [IU] via SUBCUTANEOUS
  Filled 2021-06-23: qty 0.15

## 2021-06-23 MED ORDER — SODIUM CHLORIDE 0.9% FLUSH: 3.0000 mL | INTRAVENOUS | Status: DC | PRN

## 2021-06-23 MED ORDER — HEPARIN SODIUM (PORCINE) 1000 UNIT/ML IJ SOLN
INTRAMUSCULAR | Status: DC | PRN
  Administered 2021-06-23: 10000 [IU] via INTRAVENOUS
  Administered 2021-06-23: 4000 [IU] via INTRAVENOUS
  Administered 2021-06-23: 3000 [IU] via INTRAVENOUS

## 2021-06-23 SURGICAL SUPPLY — 29 items
BALLN COYOTE OTW 1.5X40X150 (BALLOONS) ×3
BALLN COYOTE OTW 2X220X150 (BALLOONS) ×3
BALLN COYOTE OTW 3X220X150 (BALLOONS) ×3
BALLOON COYOTE OTW 1.5X40X150 (BALLOONS) IMPLANT
BALLOON COYOTE OTW 2X220X150 (BALLOONS) IMPLANT
BALLOON COYOTE OTW 3X220X150 (BALLOONS) IMPLANT
CATH CROSS OVER TEMPO 5F (CATHETERS) ×1 IMPLANT
CATH CXI 2.3F 150 ANG 2 (CATHETERS) ×1 IMPLANT
CATH TEMPO AQUA 5F 100CM (CATHETERS) ×1 IMPLANT
CLOSURE PERCLOSE PROSTYLE (VASCULAR PRODUCTS) ×1 IMPLANT
DEVICE TORQUE .014-.018 (MISCELLANEOUS) IMPLANT
GLIDEWIRE NITREX 0.018X80X5 (WIRE) ×6
GUIDEWIRE NITREX 0.018X80X5 (WIRE) IMPLANT
KIT ENCORE 26 ADVANTAGE (KITS) ×1 IMPLANT
KIT PV (KITS) ×3 IMPLANT
SHEATH PINNACLE 5F 10CM (SHEATH) ×1 IMPLANT
SHEATH PINNACLE 6F 10CM (SHEATH) ×1 IMPLANT
SHEATH PINNACLE ST 6F 65CM (SHEATH) ×1 IMPLANT
SHEATH PROBE COVER 6X72 (BAG) ×1 IMPLANT
SYR MEDRAD MARK 7 150ML (SYRINGE) ×3 IMPLANT
TAPE VIPERTRACK RADIOPAQ (MISCELLANEOUS) IMPLANT
TAPE VIPERTRACK RADIOPAQUE (MISCELLANEOUS) ×3
TORQUE DEVICE .014-.018 (MISCELLANEOUS) ×3
TRANSDUCER W/STOPCOCK (MISCELLANEOUS) ×3 IMPLANT
TRAY PV CATH (CUSTOM PROCEDURE TRAY) ×3 IMPLANT
WIRE HI TORQ COMMND ES.014X300 (WIRE) ×1 IMPLANT
WIRE HITORQ VERSACORE ST 145CM (WIRE) ×1 IMPLANT
WIRE MICRO SET SILHO 5FR 7 (SHEATH) ×1 IMPLANT
WIRE ROSEN-J .035X260CM (WIRE) ×1 IMPLANT

## 2021-06-23 NOTE — Progress Notes (Signed)
Inpatient Diabetes Program Recommendations  AACE/ADA: New Consensus Statement on Inpatient Glycemic Control (2015)  Target Ranges:  Prepandial:   less than 140 mg/dL      Peak postprandial:   less than 180 mg/dL (1-2 hours)      Critically ill patients:  140 - 180 mg/dL   Lab Results  Component Value Date   GLUCAP 186 (H) 06/23/2021   HGBA1C 7.9 (H) 06/14/2021    Review of Glycemic Control Results for [name], [name] (MRN [id number]) as of 06/23/2021 11:28  Ref. Range 06/22/2021 18:20 06/22/2021 20:26 06/22/2021 23:39 06/23/2021 05:03  Glucose-Capillary Latest Ref Range: 70 - 99 mg/dL 149 (H) 275 (H) 273 (H) 186 (H)   Home DM Meds: Humalog 13 units TID                             Lantus 55 units QHS   Current Orders: Novolog Sensitive Correction Scale/ SSI (0-9 units) Q4 hours Solumedrol 125 mg x 1     MD- Note Hypoglycemia on 10/11 after getting Semglee 50 units the night prior.   CBGs now elevated   Please consider restarting Semglee at 25% home dose to start:   Semglee 15 units QHS.     Thanks, Bronson Curb, MSN, RNC-OB Diabetes Coordinator [phone] (8a-5p)

## 2021-06-23 NOTE — Interval H&P Note (Signed)
History and Physical Interval Note:  06/23/2021 7:38 AM  [name]  has presented today for surgery, with the diagnosis of LEG PAIN.  The various methods of treatment have been discussed with the patient and family. After consideration of risks, benefits and other options for treatment, the patient has consented to  Procedure(s): ABDOMINAL AORTOGRAM W/LOWER EXTREMITY (N/A) as a surgical intervention.  The patient's history has been reviewed, patient examined, no change in status, stable for surgery.  I have reviewed the patient's chart and labs.  Questions were answered to the patient's satisfaction.     [place]

## 2021-06-23 NOTE — Progress Notes (Signed)
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 and [name], paroxysmal A. fib on Eliquis, ESRD on hemodialysis TTS, chronic diastolic heart failure, diabetes type 2, hypertension, dyslipidemia who was sent from dialysis center to the ED for further evaluation of wound infection.  Patient is followed by [name] for chronic left foot wound infection with history of PAD, at follow-up on 10/3 he discussed potential need for transtibial amputation, also followed by [name] due to peripheral vascular disease and plan for angiogram.   Patient was noticed to have increasing swelling and pain around the penis and the scrotum, noted purulent discharge from the urethra and drainage of purulent secretion from  penile prothesis.  In the ED he was found to have leukocytosis white count 21,000, lactic acidosis. He was evaluated by [name] and underwent removal of 3 piece inflatable penile prosthesis on 06/14/2021.   06/23/2021: Patient was seen and examined at his bedside.  States urine is leaking around his Foley catheter.  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 Continue cefepime and vancomycin currently; fluconazole discontinued previously -JP drains removed; Will need foley catheter for another week per urology  Intermittent Foley leakage, urology made aware, which is thought secondary to bladder spasm.    Left foot infection, abnormal signal abnormalities of  cuboid worrisome for osteomyelitis, cellulitis and mild fasciitis -History of peripheral arterial disease, right BKA -Consulted with [name] and [name]. -Underwent  arteriogram on 10/11: No significant iliofemoral disease.  Patent left SFA stents. 2/3 vessel below the knee runoff to the foot.  Proximal occluded left AT. Awaiting final cardiology recommendation -possible revascularization attempt pending family decision in the next few days.,  Tentative plan for revascularization 06/23/2021 at 7:30 AM if family is agreeable pending further work-up with cardiology. Continue with IV antibiotics.   ESRD on hemodialysis TTS: Last hemodialysis on 10/13 Appreciate nephrology's assistance   Paroxysmal A. fib:  Continue with Coreg Continue with heparin drip.  Holding Eliquis in anticipation of procedures.   Diabetes type 2 with hyperglycemia:  Hemoglobin A1c 7.9 on 06/14/2021. Continue Semglee 15 units daily and insulin sliding scale.  CAD status post PCI:  Continue with Coreg and atorvastatin  Hypertension:  BP stable Continue with carvedilol, Norvasc.   Chronic pain; on fentanyl patch.  Hyponatremia, hypokalemia: Correction with hemodialysis   Obesity Estimated body mass index is 32.66 kg/m as calculated from the following:   Height as of this encounter: 6\' 2"  (1.88  m).   Weight as of this encounter: 115.4  kg.     DVT prophylaxis: Heparin drip. Code Status: Full code Family Communication: Family members x2 at bedside.  Disposition Plan:  Status is: Inpatient   Remains inpatient appropriate because:Inpatient level of care appropriate due to severity of illness   Dispo: The patient is from: Home              Anticipated d/c is to: SNF              Patient currently is not medically stable to d/c.              Difficult to place patient No   Consultants:  Dr [name] urology Nephrology, Dr. Candiss Norse Dr [name] orthopedic surgery Dr [name].    Procedures:  Removal of 3 pieces inflatable penile prothesis  that were infected Tentative plan for left foot revascularization 06/23/2021   Antimicrobials:  Cefepime 10/4  Vancomycin 10/4 Fluconazole. 10/5        Objective: Vitals:   06/23/21 1042 06/23/21 1047 06/23/21 1052 06/23/21 1140  BP:    (!) 142/64  Pulse: (!) 104 90 61   Resp: 17 18 14    Temp:      TempSrc:      SpO2: 96% 99% (!) 0%   Weight:      Height:        Intake/Output Summary (Last 24 hours) at 06/23/2021 1503 Last data filed at 06/23/2021 0501 Gross per 24 hour  Intake 668.5 ml  Output 2380 ml  Net -1711.5 ml   Filed Weights   06/22/21 0315 06/22/21 1356 06/22/21 1715  Weight: 115.4 kg 114.7 kg 112.5 kg    Exam:  General: 65 y.o. year-old male well developed well nourished in no acute distress.  Alert and oriented x3. Cardiovascular: Regular rate and rhythm with no rubs or gallops.  No thyromegaly or JVD noted.   Respiratory: Clear to auscultation with no wheezes or rales. Good inspiratory effort. Abdomen: Soft nontender nondistended with normal bowel sounds x4 quadrants. Musculoskeletal: Right below the knee amputation, left foot with edema, dressing in place.  Skin: Pressure wounds, POA. Psychiatry: Mood is appropriate for condition and setting   Data Reviewed: CBC: Recent Labs  Lab 06/19/21 0043 06/20/21 0300 06/21/21 0150 06/22/21 0209  06/23/21 0314  WBC 7.6 6.5 7.3 8.3 6.7  HGB 9.8* 10.0* 10.5* 9.9* 9.6*  HCT 30.1* 29.6* 30.5* 30.6* 28.6*  MCV 91.5 89.2 89.2 92.7 92.0  PLT 215 245 237 237 858   Basic Metabolic Panel: Recent Labs  Lab 06/17/21 0836 06/18/21 0715 06/20/21 0300 06/21/21 0150 06/22/21 0209 06/23/21 0314  NA 133* 133* 136 131* 135 135  K 3.0* 3.3* 2.9* 3.9 3.8 3.7  CL 97* 95* 101 97* 98 100  CO2 25 25 23 25 25 27   GLUCOSE 112* 217* 87 216* 199* 180*  BUN 43* 32* 36* 16 31* 17  CREATININE 5.12* 4.71* 5.75* 3.29* 4.71* 3.55*  CALCIUM 7.7* 7.8* 7.9* 8.0* 8.3* 7.8*  PHOS 5.8*  --   --  3.7  --   --    GFR: Estimated Creatinine Clearance: 26.9 mL/min (A) (by C-G formula based on SCr of 3.55 mg/dL (H)). Liver Function Tests: Recent Labs  Lab 06/17/21 0836 06/21/21 0150  ALBUMIN 2.0* 2.5*   No results for input(s): LIPASE, AMYLASE in the last 168 hours. No results for input(s): AMMONIA in the last 168 hours. Coagulation Profile: No results for input(s): INR, PROTIME  in the last 168 hours. Cardiac Enzymes: No results for input(s): CKTOTAL, CKMB, CKMBINDEX, TROPONINI in the last 168 hours. BNP (last 3 results) No results for input(s): PROBNP in the last 8760 hours. HbA1C: No results for input(s): HGBA1C in the last 72 hours. CBG: Recent Labs  Lab 06/22/21 1613 06/22/21 1820 06/22/21 2026 06/22/21 2339 06/23/21 0503  GLUCAP 139* 149* 275* 273* 186*   Lipid Profile: No results for input(s): CHOL, HDL, LDLCALC, TRIG, CHOLHDL, LDLDIRECT in the last 72 hours. Thyroid Function Tests: No results for input(s): TSH, T4TOTAL, FREET4, T3FREE, THYROIDAB in the last 72 hours. Anemia Panel: No results for input(s): VITAMINB12, FOLATE, FERRITIN, TIBC, IRON, RETICCTPCT in the last 72 hours. Urine analysis:    Component Value Date/Time   COLORURINE YELLOW 06/13/2021 1220   APPEARANCEUR CLOUDY (A) 06/13/2021 1220   LABSPEC 1.013 06/13/2021 1220   PHURINE 7.0 06/13/2021 1220   GLUCOSEU >=500  (A) 06/13/2021 1220   HGBUR MODERATE (A) 06/13/2021 1220   BILIRUBINUR NEGATIVE 06/13/2021 1220   KETONESUR 5 (A) 06/13/2021 1220   PROTEINUR 100 (A) 06/13/2021 1220   UROBILINOGEN 0.2 01/14/2015 0346   NITRITE NEGATIVE 06/13/2021 1220   LEUKOCYTESUR LARGE (A) 06/13/2021 1220   Sepsis Labs: @LABRCNTIP (procalcitonin:4,lacticidven:4)  ) Recent Results (from the past 240 hour(s))  Blood Culture (routine x 2)     Status: None   Collection Time: 06/13/21  7:40 PM   Specimen: BLOOD  Result Value Ref Range Status   Specimen Description BLOOD RIGHT ANTECUBITAL  Final   Special Requests   Final    BOTTLES DRAWN AEROBIC AND ANAEROBIC Blood Culture results may not be optimal due to an inadequate volume of blood received in culture bottles   Culture   Final    NO GROWTH 5 DAYS Performed at [facility], [place] [address] [place], [place] [address]    Report Status 06/18/2021 FINAL  Final  Resp Panel by RT-PCR (Flu A&B, Covid) Nasopharyngeal Swab     Status: None   Collection Time: 06/13/21  8:17 PM   Specimen: Nasopharyngeal Swab; Nasopharyngeal(NP) swabs in vial transport medium  Result Value Ref Range Status   SARS Coronavirus 2 by RT PCR NEGATIVE NEGATIVE Final    Comment: (NOTE) SARS-CoV-2 target nucleic acids are NOT DETECTED.  The SARS-CoV-2 RNA is generally detectable in upper respiratory specimens during the acute phase of infection. The lowest concentration of SARS-CoV-2 viral copies this assay can detect is 138 copies/mL. A negative result does not preclude SARS-Cov-2 infection and should not be used as the sole basis for treatment or other patient management decisions. A negative result may occur with  improper specimen collection/handling, submission of specimen other than nasopharyngeal swab, presence of viral mutation(s) within the areas targeted by this assay, and inadequate number of viral copies(<138 copies/mL). A negative result must be combined with clinical  observations, patient history, and epidemiological information. The expected result is Negative.  Fact Sheet for Patients:  EntrepreneurPulse.com.au  Fact Sheet for Healthcare Providers:  IncredibleEmployment.be  This test is no t yet approved or cleared by the [place] FDA and  has been authorized for detection and/or diagnosis of SARS-CoV-2 by FDA under an Emergency Use Authorization (EUA). This EUA will remain  in effect (meaning this test can be used) for the duration of the COVID-19 declaration under Section 564(b)(1) of the Act, 21 U.S.C.section 360bbb-3(b)(1), unless the authorization is terminated  or revoked sooner.       Influenza A by PCR NEGATIVE  NEGATIVE Final   Influenza B by PCR NEGATIVE NEGATIVE Final    Comment: (NOTE) The Xpert Xpress SARS-CoV-2/FLU/RSV plus assay is intended as an aid in the diagnosis of influenza from Nasopharyngeal swab specimens and should not be used as a sole basis for treatment. Nasal washings and aspirates are unacceptable for Xpert Xpress SARS-CoV-2/FLU/RSV testing.  Fact Sheet for Patients: EntrepreneurPulse.com.au  Fact Sheet for Healthcare Providers: IncredibleEmployment.be  This test is not yet approved or cleared by the [place] FDA and has been authorized for detection and/or diagnosis of SARS-CoV-2 by FDA under an Emergency Use Authorization (EUA). This EUA will remain in effect (meaning this test can be used) for the duration of the COVID-19 declaration under Section 564(b)(1) of the Act, 21 U.S.C. section 360bbb-3(b)(1), unless the authorization is terminated or revoked.  Performed at [facility], [place] [address], [place], [place] [address]   Blood Culture (routine x 2)     Status: None   Collection Time: 06/13/21  8:50 PM   Specimen: BLOOD RIGHT HAND  Result Value Ref Range Status   Specimen Description BLOOD RIGHT HAND  Final    Special Requests   Final    BOTTLES DRAWN AEROBIC AND ANAEROBIC Blood Culture adequate volume   Culture   Final    NO GROWTH 5 DAYS Performed at [facility], [place] [address], [place], [place] [address]    Report Status 06/18/2021 FINAL  Final  Urine Culture     Status: Abnormal   Collection Time: 06/14/21 12:20 PM   Specimen: In/Out Cath Urine  Result Value Ref Range Status   Specimen Description IN/OUT CATH URINE  Final   Special Requests   Final    NONE Performed at [facility], [place] [address], [place], [place] [address]    Culture >=100,000 COLONIES/mL ENTEROCOCCUS FAECALIS (A)  Final   Report Status 06/17/2021 FINAL  Final   Organism ID, Bacteria ENTEROCOCCUS FAECALIS (A)  Final      Susceptibility   Enterococcus faecalis - MIC*    AMPICILLIN <=2 SENSITIVE Sensitive     NITROFURANTOIN <=16 SENSITIVE Sensitive     VANCOMYCIN 1 SENSITIVE Sensitive     * >=100,000 COLONIES/mL ENTEROCOCCUS FAECALIS  Aerobic/Anaerobic Culture w Gram Stain (surgical/deep wound)     Status: None   Collection Time: 06/14/21  8:14 PM   Specimen: PATH GI Other; Tissue  Result Value Ref Range Status   Specimen Description WOUND PENIS  Final   Special Requests DISCHARGE  Final   Gram Stain   Final    NO ORGANISMS SEEN SQUAMOUS EPITHELIAL CELLS PRESENT ABUNDANT WBC PRESENT,BOTH PMN AND MONONUCLEAR MODERATE GRAM POSITIVE COCCI    Culture   Final    FEW ENTEROCOCCUS FAECALIS FEW GROUP B STREP(S.AGALACTIAE)ISOLATED TESTING AGAINST S. AGALACTIAE NOT ROUTINELY PERFORMED DUE TO PREDICTABILITY OF AMP/PEN/VAN SUSCEPTIBILITY. NO ANAEROBES ISOLATED Performed at [facility], [place] [address] [place], [place] [address]    Report Status 06/20/2021 FINAL  Final   Organism ID, Bacteria ENTEROCOCCUS FAECALIS  Final      Susceptibility   Enterococcus faecalis - MIC*    AMPICILLIN <=2 SENSITIVE Sensitive     VANCOMYCIN 1 SENSITIVE Sensitive     GENTAMICIN SYNERGY SENSITIVE Sensitive     *  FEW ENTEROCOCCUS FAECALIS  Fungus Culture With Stain     Status: None (Preliminary result)   Collection Time: 06/14/21  8:14 PM  Result Value Ref Range Status   Fungus Stain  Final report  Final    Comment: (NOTE) Performed At: [facility] [place], [place] [id number] Rush Farmer MD WG:[id number]    Fungus (Mycology) Culture PENDING  Incomplete   Fungal Source WOUND  Final    Comment: PENIS DISHARGE Performed at [facility], [place] [address], [place], [place] [address]   Fungus Culture Result     Status: None   Collection Time: 06/14/21  8:14 PM  Result Value Ref Range Status   Result 1 Comment  Final    Comment: (NOTE) KOH/Calcofluor preparation:  no fungus observed. Performed At: [facility] [place], [place] [id number] Rush Farmer MD KG:[id number]   Surgical PCR screen     Status: Abnormal   Collection Time: 06/14/21 11:20 PM   Specimen: Nasal Mucosa; Nasal Swab  Result Value Ref Range Status   MRSA, PCR POSITIVE (A) NEGATIVE Final    Comment: RESULT CALLED TO, READ BACK BY AND VERIFIED WITH: RN CAREY D. 06/15/21@1 :09 BY TW    Staphylococcus aureus POSITIVE (A) NEGATIVE Final    Comment: (NOTE) The Xpert SA Assay (FDA approved for NASAL specimens in patients 60 years of age and older), is one component of a comprehensive surveillance program. It is not intended to diagnose infection nor to guide or monitor treatment. Performed at [facility], [place] [address], [place], [place] [address]   Urine Culture     Status: None   Collection Time: 06/21/21  2:16 PM   Specimen: Urine, Catheterized  Result Value Ref Range Status   Specimen Description URINE, CATHETERIZED  Final   Special Requests NONE  Final   Culture   Final    NO GROWTH Performed at [facility], [address], [place], [place] [address]    Report Status 06/22/2021 FINAL  Final      Studies: No results found.  Scheduled Meds:   (feeding supplement) PROSource Plus  30 mL Oral BID BM   amLODipine  5 mg Oral Daily   atorvastatin  40 mg Oral QHS   carvedilol  37.5 mg Oral BID WC   Chlorhexidine Gluconate Cloth  6 each Topical Q0600   collagenase   Topical Daily   darbepoetin (ARANESP) injection - DIALYSIS  100 mcg Intravenous Q Tue-HD   fentaNYL  1 patch Transdermal Q72H   insulin aspart  0-9 Units Subcutaneous Q4H   insulin glargine-yfgn  15 Units Subcutaneous Daily   oxybutynin  5 mg Oral TID   sodium chloride flush  3 mL Intravenous Q12H   sodium chloride flush  3 mL Intravenous Q12H   sodium chloride flush  3 mL Intravenous Q12H    Continuous Infusions:  sodium chloride     sodium chloride 250 mL (06/23/21 1007)   ceFEPime (MAXIPIME) IV 2 g (06/22/21 1826)   dextrose 5% lactated ringers Stopped (06/20/21 0525)   heparin     vancomycin 1,000 mg (06/22/21 1926)     LOS: 9 days     Kayleen Memos, MD [facility] Pager [phone]  If 7PM-7AM, please contact night-coverage [email or web address] Password [email or web address] 06/23/2021, 3:03 PM

## 2021-06-23 NOTE — Progress Notes (Signed)
Daily Progress Note   Patient Name: [name]       Date: 06/23/2021 DOB: [birth year]  Age: 65 y.o. MRN#: [id number] Attending Physician: Kayleen Memos, DO Primary Care Physician: [name], PA-C Admit Date: 06/13/2021 Length of Stay: 9 days  Reason for Consultation/Follow-up: Establishing goals of care  HPI/Patient Profile:  65 y.o. 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 and [name], paroxysmal A. fib on Eliquis, ESRD on hemodialysis TTS, chronic diastolic heart failure, diabetes type 2, hypertension, dyslipidemia admitted on 06/13/2021 with LE wound infection. There was a previous discussion of potential need for transtibial amputation.  He is also followed by [name] for PVD.   PMT was consulted for goals of care discussion.  Per hospitalist note the patient's wife states the patient previously indicated if he needed "amputation" he would want hospice care instead.  Previously referred for outpatient palliative care for pain management. He later clarified he wants to avoid amputation if at all possible, but is agreeable if it's necessary.  Subjective:   Subjective: Chart Reviewed. Updates received. Patient Assessed. Created space and opportunity for patient  and family to explore thoughts and feelings regarding current medical situation.  Today's Discussion: Today I met with the patient, his wife [name], and their son at the bedside.  Introduced myself and explained the palliative care to those would not previously been explained.  Discussed the current situation and what is been done thus far.  They confirmed that he did have an arteriogram with stent in the lower extremity to hopefully help with his nonhealing wound.  Wound is located on the left lateral ankle/foot.  They indicate there is plans for surgery to operate on the wound to promote  healing.  We discussed the positive development as with potentially better blood flow and procedure on the wound that this could promote better healing.  I asked about amputation and the patient states that he wants to avoid amputation if at all possible, but is amendable to amputation in the future if it is necessary.  We discussed the need to work with physical therapy.  He states he was told to not bear weight on the foot explained to physical therapy would be aware of this.  However, I described several things that PT can do without bearing weight such as sitting edge of bed, bearing weight on the nonaffected leg, pivoting, etc.  This would help meet his goal for being able to use the bedside commode rather than the bed and the bed.  He verbalized understanding.  I summarize at the end of the visit clarification of goals for continue to treat his wound and promote healing.  He is open to all currently offer medical therapies.  He would consider amputation in the future if necessary.  Overall plan is for discharge to skilled nursing facility with rehab and eventually home.  His wife inquired about obtaining a list from the social worker of in network skilled nursing facilities which I did message the social worker and she stated that she would work on.  Provided emotional and general support through therapeutic listening.  Answered all questions and addressed all concerns.  Review of Systems  Constitutional:        Constant generalized pain, well controlled on current pain medications  Respiratory:  Negative for cough and shortness of breath.  Cardiovascular:  Negative for chest pain.  Gastrointestinal:  Negative for abdominal pain, constipation, nausea and vomiting.  Neurological:        Peripheral neuropathy   Objective:   Vital Signs:  BP (!) 179/103   Pulse 61   Temp 98.2 F (36.8 C) (Oral)   Resp 14   Ht 6' 2"  (1.88 m)   Wt 112.5 kg   SpO2 (!) 0%   BMI 31.84 kg/m   Physical  Exam: Physical Exam Vitals and nursing note reviewed.  Constitutional:      General: He is not in acute distress.    Appearance: He is obese. He is ill-appearing.  HENT:     Head: Normocephalic and atraumatic.  Cardiovascular:     Rate and Rhythm: Normal rate and regular rhythm.  Pulmonary:     Effort: Pulmonary effort is normal. No respiratory distress.     Breath sounds: No wheezing or rhonchi.  Abdominal:     General: Abdomen is flat.     Palpations: Abdomen is soft.  Skin:    General: Skin is warm and dry.  Neurological:     General: No focal deficit present.     Mental Status: He is alert.    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  Now with significant lower extremity wound infection status post angiogram with no apparent stenting needs, with stent placed today.  Anticipating surgical procedure for wound (debridement?) early next week, per the patient.There is some concern about whether the patient would be able to return home versus needing rehab, TOC is working on this.  The patient has previously mentioned to his wife that if he needed another amputation he would prefer hospice, although he has clarified he would be open to amputation if it's needed, but is wanting to avoid this if at all possible.  SUMMARY OF RECOMMENDATIONS   Continue to treat the treatable Full scope of care Remain full code PMT will continue to follow peripherally for any needs that develop  Code Status:  Full code  Prognosis: Unable to determine  Discharge Planning: To Be Determined  Discussed with: Patient, patient's family, nursing staff, medical team  Thank you for allowing [place] to participate in the care of [name] PMT will continue to support holistically.  Time Total: 75 min  Visit consisted of counseling and education dealing with the complex and emotionally intense issues of symptom management and palliative care in the setting of serious and potentially life-threatening illness. Greater than 50%  of this time was spent counseling and coordinating care related to the above assessment and plan.  Walden Field, NP Palliative Medicine Team  Team Phone # [phone] (Nights/Weekends)  05/09/2021, 8:17 AM

## 2021-06-23 NOTE — Progress Notes (Signed)
[facility] Progress Note   Subjective:  Seen in room - s/p L anterior tibial revascularization procedure this morning. Denies CP/dyspnea. Upset b/c foley is leaking again.  Objective Vitals:   06/23/21 1042 06/23/21 1047 06/23/21 1052 06/23/21 1140  BP:    (!) 142/64  Pulse: (!) 104 90 61   Resp: 17 18 14    Temp:      TempSrc:      SpO2: 96% 99% (!) 0%   Weight:      Height:       Physical Exam General: Well appearing man, NAD Heart: RRR; no murmur Lungs: CTA anteriorly Abdomen: soft GU: Foley in place, not actively leaking from what I can see. suprapubic staples in place Extremities: R BKA, no LLE edema Dialysis Access: LUE AVF + thrill  Additional Objective Labs: Basic Metabolic Panel: Recent Labs  Lab 06/17/21 0836 06/18/21 0715 06/21/21 0150 06/22/21 0209 06/23/21 0314  NA 133*   < > 131* 135 135  K 3.0*   < > 3.9 3.8 3.7  CL 97*   < > 97* 98 100  CO2 25   < > 25 25 27   GLUCOSE 112*   < > 216* 199* 180*  BUN 43*   < > 16 31* 17  CREATININE 5.12*   < > 3.29* 4.71* 3.55*  CALCIUM 7.7*   < > 8.0* 8.3* 7.8*  PHOS 5.8*  --  3.7  --   --    < > = values in this interval not displayed.   Liver Function Tests: Recent Labs  Lab 06/17/21 0836 06/21/21 0150  ALBUMIN 2.0* 2.5*   CBC: Recent Labs  Lab 06/19/21 0043 06/20/21 0300 06/21/21 0150 06/22/21 0209 06/23/21 0314  WBC 7.6 6.5 7.3 8.3 6.7  HGB 9.8* 10.0* 10.5* 9.9* 9.6*  HCT 30.1* 29.6* 30.5* 30.6* 28.6*  MCV 91.5 89.2 89.2 92.7 92.0  PLT 215 245 237 237 200   Studies/Results: VAS [place] LOWER EXTREMITY ARTERIAL DUPLEX  Result Date: 06/22/2021 LOWER EXTREMITY ARTERIAL DUPLEX STUDY Patient Name:  [name]  Date of Exam:   06/22/2021 Medical Rec #: [id number]      Accession #:    [id number] Date of Birth: March 26, 1968      Patient Gender: M Patient Age:   65 years Exam Location:  [facility] Procedure:      VAS [place] LOWER EXTREMITY ARTERIAL DUPLEX Referring Phys: [facility]  PATWARDHAN --------------------------------------------------------------------------------  Indications: Ulceration, and peripheral artery disease. High Risk Factors: Hyperlipidemia, Diabetes, current smoker.  Vascular Interventions: Right BKA, Left CEA. Current ABI:            R:BKA L:non compressible toe:0.58 Comparison Study: No prior LEA Performing Technologist: Sharion Dove RVS  Examination Guidelines: A complete evaluation includes B-mode imaging, spectral Doppler, color Doppler, and power Doppler as needed of all accessible portions of each vessel. Bilateral testing is considered an integral part of a complete examination. Limited examinations for reoccurring indications may be performed as noted.  +----------+--------+-----+--------+-------------------+--------+ LEFT      PSV cm/sRatioStenosisWaveform           Comments +----------+--------+-----+--------+-------------------+--------+ CFA Prox  130                  multiphasic                 +----------+--------+-----+--------+-------------------+--------+ DFA       68  multiphasic                 +----------+--------+-----+--------+-------------------+--------+ SFA Prox  101                  multiphasic                 +----------+--------+-----+--------+-------------------+--------+ SFA Mid   122                  multiphasic                 +----------+--------+-----+--------+-------------------+--------+ SFA Distal121                  multiphasic                 +----------+--------+-----+--------+-------------------+--------+ POP Prox  79                   monophasic                  +----------+--------+-----+--------+-------------------+--------+ POP Distal76                   monophasic                  +----------+--------+-----+--------+-------------------+--------+ ATA Prox  69                   monophasic                   +----------+--------+-----+--------+-------------------+--------+ ATA Mid   26                   monophasic                  +----------+--------+-----+--------+-------------------+--------+ ATA Distal15                   dampened monophasic         +----------+--------+-----+--------+-------------------+--------+ PTA Prox  79                   multiphasic                 +----------+--------+-----+--------+-------------------+--------+ PTA Mid   116                  multiphasic                 +----------+--------+-----+--------+-------------------+--------+ PTA Distal101                  multiphasic                 +----------+--------+-----+--------+-------------------+--------+   Summary: Right: Multiphasic waveforms noted in the CFA, FA, PFA, and PTA. Monophasic flow noted in the popliteal artery indicative of a more proximal stenosis. Monophasic flow in the anterior tibial artery.  See table(s) above for measurements and observations. Electronically signed by Orlie Pollen on 06/22/2021 at 3:14:37 PM.    Final     Medications:  sodium chloride     sodium chloride 250 mL (06/23/21 1007)   ceFEPime (MAXIPIME) IV 2 g (06/22/21 1826)   dextrose 5% lactated ringers Stopped (06/20/21 0525)   heparin     vancomycin 1,000 mg (06/22/21 1926)    amLODipine  5 mg Oral Daily   atorvastatin  40 mg Oral QHS   carvedilol  37.5 mg Oral BID WC   Chlorhexidine Gluconate Cloth  6 each Topical Q0600   collagenase   Topical Daily   darbepoetin (ARANESP) injection - DIALYSIS  100 mcg Intravenous Q Tue-HD   fentaNYL  1 patch Transdermal Q72H   insulin aspart  0-9 Units Subcutaneous Q4H   insulin glargine-yfgn  15 Units Subcutaneous Daily   oxybutynin  5 mg Oral TID   sodium chloride flush  3 mL Intravenous Q12H   sodium chloride flush  3 mL Intravenous Q12H   sodium chloride flush  3 mL Intravenous Q12H    Dialysis Orders: [facility]   4h 56min  450/800  108kg  2/2 bath   AVF  Hep none  - Mircera 160mcg IV q 2 wks (last 9/27) - Hectoral 36mcg IV q HD   Assessment/Plan:  L foot infection + penile prosthesis infection: prosthesis removed by urology on 10/5. 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  Urine cx growing enterococcus faecalis. Remains on Vanc/Cefepime.   ESRD: HD TTS - next tomorrow.  HTN/volume: BP controlled, keep same dry weight for now  Anemia: Hgb 9.6 - continue Aranesp q Tues   Metabolic bone disease: CorrCa/Phos ok, continue home binders  Nutrition:  Alb low, will add protein supplements.  T2DM: per primary  PAD: Arteriogram 10/11, s/p PTA to anterior tibial artery this morning with cardiology.  Veneta Penton, PA-C 06/23/2021, 1:59 PM  [employer]

## 2021-06-23 NOTE — Progress Notes (Signed)
ANTICOAGULATION CONSULT NOTE  Pharmacy Consult for heparin Indication: atrial fibrillation  Allergies  Allergen Reactions   Contrast Media [Iodinated Diagnostic Agents]     Difficulty breathing     Ivp Dye [Iodinated Diagnostic Agents] Anaphylaxis and Other (See Comments)    Breathing problems, altered mental state    Adhesive [Tape] Rash    Rash after 3-4 days use    Patient Measurements: Height: 6\' 2"  (188 cm) Weight: 112.5 kg (248 lb 0.3 oz) IBW/kg (Calculated) : 82.2 Heparin Dosing Weight: 105kg  Vital Signs: Temp: 98.2 F (36.8 C) (10/14 0459) Temp Source: Oral (10/14 0459) BP: 142/64 (10/14 1140) Pulse Rate: 61 (10/14 1052)  Labs: Recent Labs    06/21/21 0150 06/22/21 0209 06/23/21 0314 06/23/21 1149  HGB 10.5* 9.9* 9.6*  --   HCT 30.5* 30.6* 28.6*  --   PLT 237 237 200  --   HEPARINUNFRC 0.49 0.26* 0.27* >1.10*  CREATININE 3.29* 4.71* 3.55*  --      Estimated Creatinine Clearance: 26.9 mL/min (A) (by C-G formula based on SCr of 3.55 mg/dL (H)).   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   LEFT CAROTID ENDARTERECTOMY   Chronic kidney disease    Complication of anesthesia    BP WENT UP AT [facility] "   COPD (chronic obstructive pulmonary disease) ([place])    pt denies this dx as of 06/01/20 - no inhaler    Diabetes mellitus without complication ([place])    Diverticulitis    Diverticulosis of colon (without mention of hemorrhage)    DJD (degenerative joint disease)    knees/hands/feet/back/neck   Fatty liver    Full dentures    GERD (gastroesophageal reflux disease)    H/O hiatal hernia    History of blood transfusion    with a past surical procedure per patient 06/01/20   Hyperlipidemia    Hypertension    Neuromuscular disorder ([place])    peripheral neuropathy   Non-pressure chronic ulcer of other part of left foot limited to breakdown of skin ([place]) 11/12/2016   Osteomyelitis ([facility])    left 5th metatarsal    PAD (peripheral artery disease) ([place])    Distal aortogram June 2012. Atherectomy left popliteal artery July 2012.    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 He is s/p procedure on 105 and has L foot osteomyelitis and plans for possible procedures. He is noted with ESRD on HD.  S/p abdominal aortogram and PVD intervention - to resume heparin this evening. Received multiple doses of heparin during his procedure  Goal of Therapy:  Heparin level 0.3-0.7 units/ml Monitor platelets by anticoagulation protocol: Yes   Plan:  Restart heparin at 2100 units/hr at 1700 pm tonight Daily heparin level, CBC  Thank you Anette Guarneri, PharmD Please see amion for complete clinical pharmacist phone list 06/23/2021 12:34 PM

## 2021-06-23 NOTE — Progress Notes (Signed)
ANTICOAGULATION CONSULT NOTE  Pharmacy Consult for heparin Indication: atrial fibrillation  Allergies  Allergen Reactions   Contrast Media [Iodinated Diagnostic Agents]     Difficulty breathing     Ivp Dye [Iodinated Diagnostic Agents] Anaphylaxis and Other (See Comments)    Breathing problems, altered mental state    Adhesive [Tape] Rash    Rash after 3-4 days use    Patient Measurements: Height: 6\' 2"  (188 cm) Weight: 112.5 kg (248 lb 0.3 oz) IBW/kg (Calculated) : 82.2 Heparin Dosing Weight: 105kg  Vital Signs: Temp: 98.7 F (37.1 C) (10/13 2337) Temp Source: Oral (10/13 2337) BP: 116/55 (10/13 2337) Pulse Rate: 73 (10/13 2337)  Labs: Recent Labs    06/21/21 0150 06/22/21 0209 06/23/21 0314  HGB 10.5* 9.9* 9.6*  HCT 30.5* 30.6* 28.6*  PLT 237 237 200  HEPARINUNFRC 0.49 0.26* 0.27*  CREATININE 3.29* 4.71* 3.55*     Estimated Creatinine Clearance: 26.9 mL/min (A) (by C-G formula based on SCr of 3.55 mg/dL (H)).   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   LEFT CAROTID ENDARTERECTOMY   Chronic kidney disease    Complication of anesthesia    BP WENT UP AT [facility] "   COPD (chronic obstructive pulmonary disease) ([place])    pt denies this dx as of 06/01/20 - no inhaler    Diabetes mellitus without complication ([place])    Diverticulitis    Diverticulosis of colon (without mention of hemorrhage)    DJD (degenerative joint disease)    knees/hands/feet/back/neck   Fatty liver    Full dentures    GERD (gastroesophageal reflux disease)    H/O hiatal hernia    History of blood transfusion    with a past surical procedure per patient 06/01/20   Hyperlipidemia    Hypertension    Neuromuscular disorder ([place])    peripheral neuropathy   Non-pressure chronic ulcer of other part of left foot limited to breakdown of skin ([place]) 11/12/2016   Osteomyelitis (HCC)    left 5th metatarsal   PAD (peripheral artery disease) ([place])     Distal aortogram June 2012. Atherectomy left popliteal artery July 2012.    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 He is s/p procedure on 105 and has L foot osteomyelitis and plans for possible procedures. He is noted with ESRD on HD.  Heparin level down to slightly subtherapeutic (0.27) on gtt at 1800 units/hr. No issues with line or bleeding reported per RN.  Goal of Therapy:  Heparin level 0.3-0.7 units/ml Monitor platelets by anticoagulation protocol: Yes   Plan:  Increase heparin to 2100 units/hr F/u 8 hr heparin level  Sherlon Handing, PharmD, BCPS Please see amion for complete clinical pharmacist phone list 06/23/2021 4:08 AM

## 2021-06-23 NOTE — Progress Notes (Signed)
PT Cancellation Note  Patient Details Name: [name] MRN: [id number] DOB: 26-Dec-1955   Cancelled Treatment:    Reason Eval/Treat Not Completed: Patient at procedure or test/unavailable this AM, will continue to follow and attempt to progress established POC as time/schedule allows.   West Carbo, PT, DPT   Acute Rehabilitation Department Pager #: [phone]   Sandra Cockayne 06/23/2021, 10:56 AM

## 2021-06-23 NOTE — Progress Notes (Signed)
OT Cancellation Note  Patient Details Name: [name] MRN: [id number] DOB: 11/27/1955   Cancelled Treatment:    Reason Eval/Treat Not Completed: Patient at procedure or test/ unavailable (Pt in cath lab.)  Malka So 06/23/2021, 8:22 AM Nestor Lewandowsky, OTR/L Acute Rehabilitation Services Pager: [phone] Office: [phone]

## 2021-06-23 NOTE — CV Procedure (Signed)
Successful left AT revascularization. Full report to follow Rt CFA Perclose closure   Nigel Mormon, MD Pager: [phone] Office: [phone]

## 2021-06-24 DIAGNOSIS — R6520 Severe sepsis without septic shock: Secondary | ICD-10-CM | POA: Diagnosis not present

## 2021-06-24 DIAGNOSIS — A419 Sepsis, unspecified organism: Secondary | ICD-10-CM | POA: Diagnosis not present

## 2021-06-24 DIAGNOSIS — E872 Acidosis, unspecified: Secondary | ICD-10-CM | POA: Diagnosis not present

## 2021-06-24 LAB — BASIC METABOLIC PANEL
Anion gap: 12 (ref 5–15)
Anion gap: 13 (ref 5–15)
Anion gap: 11 (ref 5–15)
BUN: 34 mg/dL — ABNORMAL HIGH (ref 8–23)
BUN: 36 mg/dL — ABNORMAL HIGH (ref 8–23)
BUN: 36 mg/dL — ABNORMAL HIGH (ref 8–23)
CO2: 23 mmol/L (ref 22–32)
CO2: 21 mmol/L — ABNORMAL LOW (ref 22–32)
CO2: 25 mmol/L (ref 22–32)
Calcium: 8.2 mg/dL — ABNORMAL LOW (ref 8.9–10.3)
Calcium: 8.2 mg/dL — ABNORMAL LOW (ref 8.9–10.3)
Calcium: 8.3 mg/dL — ABNORMAL LOW (ref 8.9–10.3)
Chloride: 94 mmol/L — ABNORMAL LOW (ref 98–111)
Chloride: 95 mmol/L — ABNORMAL LOW (ref 98–111)
Chloride: 97 mmol/L — ABNORMAL LOW (ref 98–111)
Creatinine, Ser: 4.7 mg/dL — ABNORMAL HIGH (ref 0.61–1.24)
Creatinine, Ser: 4.67 mg/dL — ABNORMAL HIGH (ref 0.61–1.24)
Creatinine, Ser: 4.76 mg/dL — ABNORMAL HIGH (ref 0.61–1.24)
GFR, Estimated: 13 mL/min — ABNORMAL LOW (ref >60)
GFR, Estimated: 13 mL/min — ABNORMAL LOW (ref >60)
GFR, Estimated: 13 mL/min — ABNORMAL LOW (ref >60)
Glucose, Bld: 412 mg/dL — ABNORMAL HIGH (ref 70–99)
Glucose, Bld: 216 mg/dL — ABNORMAL HIGH (ref 70–99)
Glucose, Bld: 146 mg/dL — ABNORMAL HIGH (ref 70–99)
Potassium: 4.9 mmol/L (ref 3.5–5.1)
Potassium: 4.4 mmol/L (ref 3.5–5.1)
Potassium: 4.4 mmol/L (ref 3.5–5.1)
Sodium: 129 mmol/L — ABNORMAL LOW (ref 135–145)
Sodium: 129 mmol/L — ABNORMAL LOW (ref 135–145)
Sodium: 133 mmol/L — ABNORMAL LOW (ref 135–145)

## 2021-06-24 LAB — CBC
HCT: 27.7 % — ABNORMAL LOW (ref 39.0–52.0)
Hemoglobin: 9.2 g/dL — ABNORMAL LOW (ref 13.0–17.0)
MCH: 30.4 pg (ref 26.0–34.0)
MCHC: 33.2 g/dL (ref 30.0–36.0)
MCV: 91.4 fL (ref 80.0–100.0)
Platelets: 212 10*3/uL (ref 150–400)
RBC: 3.03 MIL/uL — ABNORMAL LOW (ref 4.22–5.81)
RDW: 14 % (ref 11.5–15.5)
WBC: 10.1 10*3/uL (ref 4.0–10.5)
nRBC: 0.2 % (ref 0.0–0.2)

## 2021-06-24 LAB — GLUCOSE, CAPILLARY
Glucose-Capillary: 123 mg/dL — ABNORMAL HIGH (ref 70–99)
Glucose-Capillary: 151 mg/dL — ABNORMAL HIGH (ref 70–99)
Glucose-Capillary: 153 mg/dL — ABNORMAL HIGH (ref 70–99)
Glucose-Capillary: 161 mg/dL — ABNORMAL HIGH (ref 70–99)
Glucose-Capillary: 191 mg/dL — ABNORMAL HIGH (ref 70–99)
Glucose-Capillary: 206 mg/dL — ABNORMAL HIGH (ref 70–99)
Glucose-Capillary: 249 mg/dL — ABNORMAL HIGH (ref 70–99)
Glucose-Capillary: 289 mg/dL — ABNORMAL HIGH (ref 70–99)
Glucose-Capillary: 325 mg/dL — ABNORMAL HIGH (ref 70–99)
Glucose-Capillary: 365 mg/dL — ABNORMAL HIGH (ref 70–99)
Glucose-Capillary: 470 mg/dL — ABNORMAL HIGH (ref 70–99)

## 2021-06-24 LAB — HEPARIN LEVEL (UNFRACTIONATED)
Heparin Unfractionated: 0.53 IU/mL (ref 0.30–0.70)
Heparin Unfractionated: 0.67 IU/mL (ref 0.30–0.70)

## 2021-06-24 LAB — BETA-HYDROXYBUTYRIC ACID
Beta-Hydroxybutyric Acid: 0.08 mmol/L (ref 0.05–0.27)
Beta-Hydroxybutyric Acid: 0.06 mmol/L (ref 0.05–0.27)

## 2021-06-24 LAB — SEDIMENTATION RATE: Sed Rate: 17 mm/hr — ABNORMAL HIGH (ref 0–16)

## 2021-06-24 LAB — C-REACTIVE PROTEIN: CRP: 0.6 mg/dL

## 2021-06-24 MED ORDER — INSULIN GLARGINE-YFGN 100 UNIT/ML ~~LOC~~ SOLN
20.0000 [IU] | Freq: Every day | SUBCUTANEOUS | Status: DC
  Administered 2021-06-24 – 2021-06-25 (×2): 20 [IU] via SUBCUTANEOUS
  Filled 2021-06-24 (×2): qty 0.2

## 2021-06-24 MED ORDER — SODIUM CHLORIDE 0.9% FLUSH
3.0000 mL | INTRAVENOUS | Status: DC | PRN
  Administered 2021-06-30: 3 mL via INTRAVENOUS

## 2021-06-24 MED ORDER — DEXTROSE 50 % IV SOLN: 0.0000 mL | INTRAVENOUS | Status: DC | PRN

## 2021-06-24 MED ORDER — INSULIN ASPART 100 UNIT/ML IJ SOLN
4.0000 [IU] | Freq: Three times a day (TID) | INTRAMUSCULAR | Status: DC
  Administered 2021-06-24 – 2021-06-25 (×3): 4 [IU] via SUBCUTANEOUS

## 2021-06-24 MED ORDER — INSULIN ASPART 100 UNIT/ML IJ SOLN
0.0000 [IU] | Freq: Every day | INTRAMUSCULAR | Status: DC
Start: 2021-06-24 — End: 2021-06-30
  Administered 2021-06-25 – 2021-06-26 (×2): 2 [IU] via SUBCUTANEOUS
  Administered 2021-06-28: 3 [IU] via SUBCUTANEOUS
  Administered 2021-06-29: 2 [IU] via SUBCUTANEOUS

## 2021-06-24 MED ORDER — INSULIN ASPART 100 UNIT/ML IJ SOLN
0.0000 [IU] | Freq: Three times a day (TID) | INTRAMUSCULAR | Status: DC
  Administered 2021-06-24: 1 [IU] via SUBCUTANEOUS
  Administered 2021-06-25 (×2): 2 [IU] via SUBCUTANEOUS
  Administered 2021-06-26 – 2021-06-30 (×5): 3 [IU] via SUBCUTANEOUS
  Administered 2021-06-30: 5 [IU] via SUBCUTANEOUS
  Administered 2021-06-30: 3 [IU] via SUBCUTANEOUS

## 2021-06-24 MED ORDER — LACTATED RINGERS IV SOLN: INTRAVENOUS | Status: DC

## 2021-06-24 MED ORDER — DEXTROSE IN LACTATED RINGERS 5 % IV SOLN: INTRAVENOUS | Status: DC

## 2021-06-24 MED ORDER — SODIUM CHLORIDE 0.9 % IV SOLN: 250.0000 mL | INTRAVENOUS | Status: DC | PRN

## 2021-06-24 MED ORDER — SODIUM CHLORIDE 0.9% FLUSH
3.0000 mL | Freq: Two times a day (BID) | INTRAVENOUS | Status: DC
  Administered 2021-06-25 – 2021-06-30 (×9): 3 mL via INTRAVENOUS

## 2021-06-24 MED ORDER — INSULIN REGULAR(HUMAN) IN NACL 100-0.9 UT/100ML-% IV SOLN
INTRAVENOUS | Status: DC
  Administered 2021-06-24: 11.5 [IU]/h via INTRAVENOUS
  Filled 2021-06-24: qty 100

## 2021-06-24 NOTE — Progress Notes (Signed)
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 and [name], paroxysmal A. fib on Eliquis, ESRD on hemodialysis TTS, chronic diastolic heart failure, diabetes type 2, hypertension, dyslipidemia who was sent from dialysis center to the ED for further evaluation of wound infection.  Patient is followed by [name] for chronic left foot wound infection with history of PAD, at follow-up on 10/3 he discussed potential need for transtibial amputation, also followed by [name] due to peripheral vascular disease and plan for angiogram.   Patient was noticed to have increasing swelling and pain around the penis and the scrotum, noted purulent discharge from the urethra and drainage of purulent secretion from penile prothesis.  In the ED he was found to have leukocytosis white count 21,000, lactic acidosis. He was evaluated by [name] and underwent removal of 3 piece inflatable penile prosthesis on 06/14/2021.   06/24/2021: Reports having his usual musculoskeletal and neuropathy pain.  Severely hyperglycemic overnight, he was started on insulin drip.  Transitioned to subcu insulin today with the assistance of diabetes coordinator.  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 Continue cefepime and vancomycin currently; fluconazole discontinued previously -JP drains removed; Will need foley catheter for another week per urology  Intermittent Foley leakage, urology made aware, which is thought secondary to bladder spasm.    Left foot infection, abnormal signal abnormalities of  cuboid worrisome for osteomyelitis, cellulitis and mild fasciitis -History of peripheral arterial disease, right BKA -Consulted with [name] and [name]. -Underwent  arteriogram on 10/11: No significant iliofemoral disease.  Patent left SFA stents. 2/3 vessel below the knee runoff to the foot.  Proximal occluded left AT.  Seen by cardiology, post PTA to left AT on 06/23/2021.  Left foot, no palpable DP pulse.  Cardiology will arrange for outpatient duplex ultrasound and outpatient follow-up. Obtain ESR and CRP levels. Continue with IV antibiotics, cefepime and IV vancomycin.  Diabetes type 2 with hyperglycemia:  CBGs in the 500s yesterday evening, was started on insulin drip.  Transitioned to subcu insulin on 06/24/2021. Diabetes coordinator following. Hemoglobin A1c 7.9 on 06/14/2021. Currently on Semglee 20 units daily, NovoLog 4 units 3 times daily, and insulin sliding scale.   ESRD on hemodialysis TTS: Hemodialysis on 06/24/2021 Appreciate nephrology's assistance   Paroxysmal A. fib:  Continue with Coreg Continue with heparin drip due to possible procedure by orthopedic surgery.   Holding Eliquis in anticipation of procedures.  CAD status post PCI:  Continue with Coreg and atorvastatin  Hypertension:  BP  stable Continue with carvedilol, Norvasc. Norvasc held today prior to hemodialysis to avoid hypotension   Chronic pain; on fentanyl patch.   Resolved hyponatremia, hypokalemia:  Electrolytes addressed with hemodialysis.  Obesity Estimated body mass index is 32.66 kg/m as calculated from the following:   Height as of this encounter: _0  (1.88 m).   Weight as of this encounter: 115.4 kg.     DVT prophylaxis: Heparin drip.  Code Status: Full code Family Communication: Family members x2 at bedside.  Disposition Plan:  Status is: Inpatient   Remains inpatient appropriate because:Inpatient level of care appropriate due to severity of illness   Dispo: The patient is from: Home              Anticipated d/c is to: SNF              Patient currently is not medically stable to d/c.              Difficult to place patient No   Consultants:  Dr [name] urology Nephrology, Dr. Candiss Norse Dr [name] orthopedic surgery Dr [name].    Procedures:  Removal of 3 pieces inflatable penile prothesis  that were infected Tentative plan for left foot revascularization 06/23/2021   Antimicrobials:  Cefepime 10/4  Vancomycin 10/4 Fluconazole. 10/5        Objective: Vitals:   06/24/21 1300 06/24/21 1330 06/24/21 1400 06/24/21 1430  BP: 110/90 112/79 118/60 131/62  Pulse:  (!) 51 (!) 52 (!) 49  Resp: _1 Temp:      TempSrc:      SpO2: 98% 98% 98% 98%  Weight:      Height:        Intake/Output Summary (Last 24 hours) at 06/24/2021 1506 Last data filed at 06/24/2021 1100 Gross per 24 hour  Intake --  Output 1240 ml  Net -1240 ml   Filed Weights   06/22/21 1715 06/24/21 0354 06/24/21 1156  Weight: 112.5 kg 119.1 kg 118.1 kg    Exam:  General: 65 y.o. year-old male well-developed well-nourished in no acute distress.  He is alert and oriented x3.   Cardiovascular: Regular rate and rhythm no rubs or gallops.  Respiratory: Clear to auscultation with no wheezes or  rales. Abdomen: Soft normal bowel sounds present.   Musculoskeletal: Right below the knee amputation.   Skin: Pressure wounds, POA Psychiatry: Mood is appropriate for condition and setting.   Data Reviewed: CBC: Recent Labs  Lab 06/20/21 0300 06/21/21 0150 06/22/21 0209 06/23/21 0314 06/24/21 0509  WBC 6.5 7.3 8.3 6.7 10.1  HGB 10.0* 10.5* 9.9* 9.6* 9.2*  HCT 29.6* 30.5* 30.6* 28.6* 27.7*  MCV 89.2 89.2 92.7 92.0 91.4  PLT 245 237 237 200 329   Basic Metabolic Panel: Recent Labs  Lab 06/21/21 0150 06/22/21 0209 06/23/21 0314 06/23/21 2102 06/24/21 0106 06/24/21 0509 06/24/21 0743  NA 131*   < > 135 129* 129* 129* 133*  K 3.9   < > 3.7 5.0 4.9 4.4 4.4  CL 97*   < > 100 93* 94* 95* 97*  CO2 25   < > 27 21* 23 21* 25  GLUCOSE 216*   < > 180* 545* 412* 216* 146*  BUN 16   < > 17 33* 34* 36* 36*  CREATININE 3.29*   < > 3.55* 4.57* 4.70* 4.67* 4.76*  CALCIUM 8.0*   < > 7.8* 8.0* 8.2* 8.2* 8.3*  PHOS 3.7  --   --   --   --   --   --    < > =  values in this interval not displayed.   GFR: Estimated Creatinine Clearance: 20.6 mL/min (A) (by C-G formula based on SCr of 4.76 mg/dL (H)). Liver Function Tests: Recent Labs  Lab 06/21/21 0150  ALBUMIN 2.5*   No results for input(s): LIPASE, AMYLASE in the last 168 hours. No results for input(s): AMMONIA in the last 168 hours. Coagulation Profile: No results for input(s): INR, PROTIME in the last 168 hours. Cardiac Enzymes: No results for input(s): CKTOTAL, CKMB, CKMBINDEX, TROPONINI in the last 168 hours. BNP (last 3 results) No results for input(s): PROBNP in the last 8760 hours. HbA1C: No results for input(s): HGBA1C in the last 72 hours. CBG: Recent Labs  Lab 06/24/21 0420 06/24/21 0525 06/24/21 0646 06/24/21 0853 06/24/21 1112  GLUCAP 249* 206* 161* 151* 153*   Lipid Profile: No results for input(s): CHOL, HDL, LDLCALC, TRIG, CHOLHDL, LDLDIRECT in the last 72 hours. Thyroid Function Tests: No results for  input(s): TSH, T4TOTAL, FREET4, T3FREE, THYROIDAB in the last 72 hours. Anemia Panel: No results for input(s): VITAMINB12, FOLATE, FERRITIN, TIBC, IRON, RETICCTPCT in the last 72 hours. Urine analysis:    Component Value Date/Time   COLORURINE YELLOW 06/13/2021 1220   APPEARANCEUR CLOUDY (A) 06/13/2021 1220   LABSPEC 1.013 06/13/2021 1220   PHURINE 7.0 06/13/2021 1220   GLUCOSEU >=500 (A) 06/13/2021 1220   HGBUR MODERATE (A) 06/13/2021 1220   BILIRUBINUR NEGATIVE 06/13/2021 1220   KETONESUR 5 (A) 06/13/2021 1220   PROTEINUR 100 (A) 06/13/2021 1220   UROBILINOGEN 0.2 01/14/2015 0346   NITRITE NEGATIVE 06/13/2021 1220   LEUKOCYTESUR LARGE (A) 06/13/2021 1220   Sepsis Labs: _0 (procalcitonin:4,lacticidven:4)  ) Recent Results (from the past 240 hour(s))  Aerobic/Anaerobic Culture w Gram Stain (surgical/deep wound)     Status: None   Collection Time: 06/14/21  8:14 PM   Specimen: PATH GI Other; Tissue  Result Value Ref Range Status   Specimen Description WOUND PENIS  Final   Special Requests DISCHARGE  Final   Gram Stain   Final    NO ORGANISMS SEEN SQUAMOUS EPITHELIAL CELLS PRESENT ABUNDANT WBC PRESENT,BOTH PMN AND MONONUCLEAR MODERATE GRAM POSITIVE COCCI    Culture   Final    FEW ENTEROCOCCUS FAECALIS FEW GROUP B STREP(S.AGALACTIAE)ISOLATED TESTING AGAINST S. AGALACTIAE NOT ROUTINELY PERFORMED DUE TO PREDICTABILITY OF AMP/PEN/VAN SUSCEPTIBILITY. NO ANAEROBES ISOLATED Performed at [facility], [place] [address] [place], [place] [address]    Report Status 06/20/2021 FINAL  Final   Organism ID, Bacteria ENTEROCOCCUS FAECALIS  Final      Susceptibility   Enterococcus faecalis - MIC*    AMPICILLIN <=2 SENSITIVE Sensitive     VANCOMYCIN 1 SENSITIVE Sensitive     GENTAMICIN SYNERGY SENSITIVE Sensitive     * FEW ENTEROCOCCUS FAECALIS  Fungus Culture With Stain     Status: None (Preliminary result)   Collection Time: 06/14/21  8:14 PM  Result Value Ref Range  Status   Fungus Stain Final report  Final    Comment: (NOTE) Performed At: [facility] [address] [place], [place] [id number] Rush Farmer MD [id number]    Fungus (Mycology) Culture PENDING  Incomplete   Fungal Source WOUND  Final    Comment: PENIS DISHARGE Performed at [facility], [place] [address], [place], [place] [address]   Fungus Culture Result     Status: None   Collection Time: 06/14/21  8:14 PM  Result Value Ref Range Status   Result 1 Comment  Final    Comment: (  NOTE) KOH/Calcofluor preparation:  no fungus observed. Performed At: [facility] [place], [place] [id number] Rush Farmer MD IR:[id number]   Surgical PCR screen     Status: Abnormal   Collection Time: 06/14/21 11:20 PM   Specimen: Nasal Mucosa; Nasal Swab  Result Value Ref Range Status   MRSA, PCR POSITIVE (A) NEGATIVE Final    Comment: RESULT CALLED TO, READ BACK BY AND VERIFIED WITH: RN CAREY D. 06/15/21_0 :09 BY TW    Staphylococcus aureus POSITIVE (A) NEGATIVE Final    Comment: (NOTE) The Xpert SA Assay (FDA approved for NASAL specimens in patients 50 years of age and older), is one component of a comprehensive surveillance program. It is not intended to diagnose infection nor to guide or monitor treatment. Performed at [facility], [place] [address], [place], [place] [address]   Urine Culture     Status: None   Collection Time: 06/21/21  2:16 PM   Specimen: Urine, Catheterized  Result Value Ref Range Status   Specimen Description URINE, CATHETERIZED  Final   Special Requests NONE  Final   Culture   Final    NO GROWTH Performed at [facility], [address] [place], [place] [address]    Report Status 06/22/2021 FINAL  Final      Studies: No results found.  Scheduled Meds:  (feeding supplement) PROSource Plus  30 mL Oral BID BM   amLODipine  5 mg Oral Daily   atorvastatin  40 mg Oral QHS   carvedilol  37.5 mg Oral BID WC    Chlorhexidine Gluconate Cloth  6 each Topical Q0600   clopidogrel  75 mg Oral Daily   collagenase   Topical Daily   darbepoetin (ARANESP) injection - DIALYSIS  100 mcg Intravenous Q Tue-HD   fentaNYL  1 patch Transdermal Q72H   insulin aspart  0-5 Units Subcutaneous QHS   insulin aspart  0-9 Units Subcutaneous TID WC   insulin aspart  4 Units Subcutaneous TID WC   insulin glargine-yfgn  20 Units Subcutaneous Daily   oxybutynin  5 mg Oral TID   sodium chloride flush  3 mL Intravenous Q12H   sodium chloride flush  3 mL Intravenous Q12H    Continuous Infusions:  sodium chloride     sodium chloride     ceFEPime (MAXIPIME) IV 2 g (06/22/21 1826)   dextrose 5% lactated ringers Stopped (06/24/21 1154)   heparin 2,100 Units/hr (06/24/21 0421)   insulin Stopped (06/24/21 1155)   vancomycin 1,000 mg (06/24/21 1431)     LOS: 10 days     Kayleen Memos, MD [facility] Pager [phone]  If 7PM-7AM, please contact night-coverage [email or web address] Password [email or web address] 06/24/2021, 3:06 PM

## 2021-06-24 NOTE — Progress Notes (Signed)
Post PTA to left AT 10/14. Right CFA site with no hematoma Left foot warm. No palpable DP pulse. Defer to [name] if and when he would recommend any amputation and the extent of amputation. Hopeful that left AT revascularization will limit tissue loss.  Continue wound care and ongoing management. Will get outpatient duplex US. Will arrange outpatient f/u   Nigel Mormon, MD Pager: [phone] Office: [phone]

## 2021-06-24 NOTE — Progress Notes (Signed)
[facility] Progress Note   Subjective:  Seen in room - on insulin drip currently, started overnight for hyperglycemia. No N/V, no CP/dyspnea. No leg pain at the moment.  Objective Vitals:   06/23/21 2029 06/23/21 2328 06/24/21 0354 06/24/21 0847  BP: (!) 153/68 (!) 145/81 (!) 146/60 (!) 158/68  Pulse: 65 67 63 65  Resp: 13 15 17 17   Temp: 98.6 F (37 C) 98.9 F (37.2 C) 98 F (36.7 C) 98.7 F (37.1 C)  TempSrc: Oral Oral Oral Oral  SpO2: 98% 93% 97% 92%  Weight:   119.1 kg   Height:       Physical Exam General: Well appearing man, NAD Heart: RRR; no murmur Lungs: CTA anteriorly Abdomen: soft GU: Foley in place, suprapubic staples in place Extremities: R BKA, no LLE edema Dialysis Access: LUE AVF + thrill  Additional Objective Labs: Basic Metabolic Panel: Recent Labs  Lab 06/21/21 0150 06/22/21 0209 06/23/21 2102 06/24/21 0106 06/24/21 0509  NA 131*   < > 129* 129* 129*  K 3.9   < > 5.0 4.9 4.4  CL 97*   < > 93* 94* 95*  CO2 25   < > 21* 23 21*  GLUCOSE 216*   < > 545* 412* 216*  BUN 16   < > 33* 34* 36*  CREATININE 3.29*   < > 4.57* 4.70* 4.67*  CALCIUM 8.0*   < > 8.0* 8.2* 8.2*  PHOS 3.7  --   --   --   --    < > = values in this interval not displayed.   Liver Function Tests: Recent Labs  Lab 06/21/21 0150  ALBUMIN 2.5*   CBC: Recent Labs  Lab 06/20/21 0300 06/21/21 0150 06/22/21 0209 06/23/21 0314 06/24/21 0509  WBC 6.5 7.3 8.3 6.7 10.1  HGB 10.0* 10.5* 9.9* 9.6* 9.2*  HCT 29.6* 30.5* 30.6* 28.6* 27.7*  MCV 89.2 89.2 92.7 92.0 91.4  PLT 245 237 237 200 212   CBG: Recent Labs  Lab 06/24/21 0319 06/24/21 0354 06/24/21 0420 06/24/21 0525 06/24/21 0646  GLUCAP 325* 289* 249* 206* 161*   Studies/Results: VAS [place] LOWER EXTREMITY ARTERIAL DUPLEX  Result Date: 06/22/2021 LOWER EXTREMITY ARTERIAL DUPLEX STUDY Patient Name:  [name]  Date of Exam:   06/22/2021 Medical Rec #: [id number]      Accession #:    [id number]  Date of Birth: 21-Mar-1956      Patient Gender: M Patient Age:   65 years Exam Location:  [facility] Procedure:      VAS [place] LOWER EXTREMITY ARTERIAL DUPLEX Referring Phys: [facility] PATWARDHAN --------------------------------------------------------------------------------  Indications: Ulceration, and peripheral artery disease. High Risk Factors: Hyperlipidemia, Diabetes, current smoker.  Vascular Interventions: Right BKA, Left CEA. Current ABI:            R:BKA L:non compressible toe:0.58 Comparison Study: No prior LEA Performing Technologist: Sharion Dove RVS  Examination Guidelines: A complete evaluation includes B-mode imaging, spectral Doppler, color Doppler, and power Doppler as needed of all accessible portions of each vessel. Bilateral testing is considered an integral part of a complete examination. Limited examinations for reoccurring indications may be performed as noted.  +----------+--------+-----+--------+-------------------+--------+ LEFT      PSV cm/sRatioStenosisWaveform           Comments +----------+--------+-----+--------+-------------------+--------+ CFA Prox  130                  multiphasic                 +----------+--------+-----+--------+-------------------+--------+  DFA       68                   multiphasic                 +----------+--------+-----+--------+-------------------+--------+ SFA Prox  101                  multiphasic                 +----------+--------+-----+--------+-------------------+--------+ SFA Mid   122                  multiphasic                 +----------+--------+-----+--------+-------------------+--------+ SFA Distal121                  multiphasic                 +----------+--------+-----+--------+-------------------+--------+ POP Prox  79                   monophasic                  +----------+--------+-----+--------+-------------------+--------+ POP Distal76                   monophasic                   +----------+--------+-----+--------+-------------------+--------+ ATA Prox  69                   monophasic                  +----------+--------+-----+--------+-------------------+--------+ ATA Mid   26                   monophasic                  +----------+--------+-----+--------+-------------------+--------+ ATA Distal15                   dampened monophasic         +----------+--------+-----+--------+-------------------+--------+ PTA Prox  79                   multiphasic                 +----------+--------+-----+--------+-------------------+--------+ PTA Mid   116                  multiphasic                 +----------+--------+-----+--------+-------------------+--------+ PTA Distal101                  multiphasic                 +----------+--------+-----+--------+-------------------+--------+   Summary: Right: Multiphasic waveforms noted in the CFA, FA, PFA, and PTA. Monophasic flow noted in the popliteal artery indicative of a more proximal stenosis. Monophasic flow in the anterior tibial artery.  See table(s) above for measurements and observations. Electronically signed by Orlie Pollen on 06/22/2021 at 3:14:37 PM.    Final    Medications:  sodium chloride     ceFEPime (MAXIPIME) IV 2 g (06/22/21 1826)   dextrose 5% lactated ringers 50 mL/hr at 06/24/21 0426   heparin 2,100 Units/hr (06/24/21 0421)   insulin 2.8 Units/hr (06/24/21 0855)   lactated ringers Stopped (06/24/21 0425)   vancomycin 1,000 mg (06/22/21 1926)    (feeding supplement) PROSource Plus  30 mL Oral BID BM  amLODipine  5 mg Oral Daily   atorvastatin  40 mg Oral QHS   carvedilol  37.5 mg Oral BID WC   Chlorhexidine Gluconate Cloth  6 each Topical Q0600   clopidogrel  75 mg Oral Daily   collagenase   Topical Daily   darbepoetin (ARANESP) injection - DIALYSIS  100 mcg Intravenous Q Tue-HD   fentaNYL  1 patch Transdermal Q72H   oxybutynin  5 mg Oral TID   sodium  chloride flush  3 mL Intravenous Q12H    Dialysis Orders: [facility]   4h 33min  450/800  108kg  2/2 bath  AVF  Hep none  - Mircera 128mcg IV q 2 wks (last 9/27) - Hectoral 32mcg IV q HD   Assessment/Plan:  L foot infection + penile prosthesis infection: prosthesis removed by urology on 10/5. 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  Urine cx growing enterococcus faecalis. Remains on Vanc/Cefepime.   ESRD: HD TTS - for HD today, cannot come up to the HD unit on insulin drip -> will wait to run him until later today.  HTN/volume: BP slightly high, UF as tolerated.  Anemia: Hgb 9.2 - continue Aranesp q Tues   Metabolic bone disease: CorrCa/Phos ok, continue home binders  Nutrition:  Alb low, continue protein supplements.  T2DM: Hyperglycemia overnight and on insulin drip - glucose improving.  PAD: Arteriogram 10/11, s/p PTA to anterior tibial artery 10/4 with cardiology.  Veneta Penton, PA-C 06/24/2021, 8:56 AM  [employer]

## 2021-06-24 NOTE — Progress Notes (Signed)
ANTICOAGULATION CONSULT NOTE  Pharmacy Consult for heparin Indication: atrial fibrillation  Allergies  Allergen Reactions   Contrast Media [Iodinated Diagnostic Agents]     Difficulty breathing     Ivp Dye [Iodinated Diagnostic Agents] Anaphylaxis and Other (See Comments)    Breathing problems, altered mental state    Adhesive [Tape] Rash    Rash after 3-4 days use    Patient Measurements: Height: 6\' 2"  (188 cm) Weight: 119.1 kg (262 lb 9.1 oz) IBW/kg (Calculated) : 82.2 Heparin Dosing Weight: 105 kg  Vital Signs: Temp: 98.7 F (37.1 C) (10/15 0847) Temp Source: Oral (10/15 0847) BP: 158/68 (10/15 0847) Pulse Rate: 65 (10/15 0847)  Labs: Recent Labs    06/22/21 0209 06/23/21 0314 06/23/21 1149 06/23/21 2102 06/24/21 0106 06/24/21 0509 06/24/21 0743  HGB 9.9* 9.6*  --   --   --  9.2*  --   HCT 30.6* 28.6*  --   --   --  27.7*  --   PLT 237 200  --   --   --  212  --   HEPARINUNFRC 0.26* 0.27* >1.10*  --   --  0.53  --   CREATININE 4.71* 3.55*  --    < > 4.70* 4.67* 4.76*   < > = values in this interval not displayed.     Estimated Creatinine Clearance: 20.7 mL/min (A) (by C-G formula based on SCr of 4.76 mg/dL (H)).   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   LEFT CAROTID ENDARTERECTOMY   Chronic kidney disease    Complication of anesthesia    BP WENT UP AT [facility] "   COPD (chronic obstructive pulmonary disease) ([place])    pt denies this dx as of 06/01/20 - no inhaler    Diabetes mellitus without complication ([place])    Diverticulitis    Diverticulosis of colon (without mention of hemorrhage)    DJD (degenerative joint disease)    knees/hands/feet/back/neck   Fatty liver    Full dentures    GERD (gastroesophageal reflux disease)    H/O hiatal hernia    History of blood transfusion    with a past surical procedure per patient 06/01/20   Hyperlipidemia    Hypertension    Neuromuscular disorder ([place])     peripheral neuropathy   Non-pressure chronic ulcer of other part of left foot limited to breakdown of skin ([place]) 11/12/2016   Osteomyelitis ([facility])    left 5th metatarsal   PAD (peripheral artery disease) ([place])    Distal aortogram June 2012. Atherectomy left popliteal artery July 2012.    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 Now s/p revascularization of occluded left AT on 10/14 in the setting of  L foot osteomyelitis and plans for possible procedures. He is noted with ESRD on HD. Pharmacy consulted for heparin dosing - heparin gtt restarted post-op.  Heparin level of 0.67 is therapeutic on heparin infusion of 2100 units/hr. CBC stable - Hgb low but stable. No bleeding noted.  Goal of Therapy:  Heparin level 0.3-0.7 units/ml Monitor platelets by anticoagulation protocol: Yes   Plan:  Continue heparin at 2100 units/hr  Daily heparin level, CBC Monitor for s/sx of bleeding  Laurey Arrow, PharmD PGY1 Pharmacy Resident 06/24/2021  11:26 AM  Please check [email or web address] for unit-specific pharmacy phone numbers.

## 2021-06-24 NOTE — Progress Notes (Signed)
ANTICOAGULATION CONSULT NOTE  Pharmacy Consult for heparin Indication: atrial fibrillation  Allergies  Allergen Reactions   Contrast Media [Iodinated Diagnostic Agents]     Difficulty breathing     Ivp Dye [Iodinated Diagnostic Agents] Anaphylaxis and Other (See Comments)    Breathing problems, altered mental state    Adhesive [Tape] Rash    Rash after 3-4 days use    Patient Measurements: Height: 6\' 2"  (188 cm) Weight: 119.1 kg (262 lb 9.1 oz) IBW/kg (Calculated) : 82.2 Heparin Dosing Weight: 105kg  Vital Signs: Temp: 98 F (36.7 C) (10/15 0354) Temp Source: Oral (10/15 0354) BP: 146/60 (10/15 0354) Pulse Rate: 63 (10/15 0354)  Labs: Recent Labs    06/22/21 0209 06/23/21 0314 06/23/21 1149 06/23/21 2102 06/24/21 0106 06/24/21 0509  HGB 9.9* 9.6*  --   --   --  9.2*  HCT 30.6* 28.6*  --   --   --  27.7*  PLT 237 200  --   --   --  212  HEPARINUNFRC 0.26* 0.27* >1.10*  --   --  0.53  CREATININE 4.71* 3.55*  --  4.57* 4.70* 4.67*     Estimated Creatinine Clearance: 21.1 mL/min (A) (by C-G formula based on SCr of 4.67 mg/dL (H)).   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   LEFT CAROTID ENDARTERECTOMY   Chronic kidney disease    Complication of anesthesia    BP WENT UP AT [facility] "   COPD (chronic obstructive pulmonary disease) ([place])    pt denies this dx as of 06/01/20 - no inhaler    Diabetes mellitus without complication ([place])    Diverticulitis    Diverticulosis of colon (without mention of hemorrhage)    DJD (degenerative joint disease)    knees/hands/feet/back/neck   Fatty liver    Full dentures    GERD (gastroesophageal reflux disease)    H/O hiatal hernia    History of blood transfusion    with a past surical procedure per patient 06/01/20   Hyperlipidemia    Hypertension    Neuromuscular disorder ([place])    peripheral neuropathy   Non-pressure chronic ulcer of other part of left foot limited to  breakdown of skin ([place]) 11/12/2016   Osteomyelitis (HCC)    left 5th metatarsal   PAD (peripheral artery disease) ([place])    Distal aortogram June 2012. Atherectomy left popliteal artery July 2012.    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 He is s/p procedure on 105 and has L foot osteomyelitis and plans for possible procedures. He is noted with ESRD on HD.  S/p abdominal aortogram and PVD intervention 10/14.Received multiple doses of heparin during his procedure. Heparin gtt restarted post-op.  Heparin level therapeutic (0.53) on gtt at 2100 units/hr. No bleeding noted.  Goal of Therapy:  Heparin level 0.3-0.7 units/ml Monitor platelets by anticoagulation protocol: Yes   Plan:  Continue heparin at 2100 units/hr F/u 6 hr confirmatory heparin level  Sherlon Handing, PharmD, BCPS Please see amion for complete clinical pharmacist phone list 06/24/2021 6:10 AM

## 2021-06-24 NOTE — Progress Notes (Signed)
Inpatient Diabetes Program Recommendations  AACE/ADA: New Consensus Statement on Inpatient Glycemic Control (2015)  Target Ranges:  Prepandial:   less than 140 mg/dL      Peak postprandial:   less than 180 mg/dL (1-2 hours)      Critically ill patients:  140 - 180 mg/dL   Lab Results  Component Value Date   GLUCAP 161 (H) 06/24/2021   HGBA1C 7.9 (H) 06/14/2021    Review of Glycemic Control Results for CAROLINE, MATTERS (MRN [id number]) as of 06/24/2021 08:33  Ref. Range 06/24/2021 04:20 06/24/2021 05:25 06/24/2021 06:46  Glucose-Capillary Latest Ref Range: 70 - 99 mg/dL 249 (H) 206 (H) 161 (H)  Home DM Meds: Humalog 13 units TID                             Lantus 55 units QHS Diabetes history: DM 2 Current orders for Inpatient glycemic control:  IV insulin started overnight (patient received 2 doses of Semglee 15 units)  Inpatient Diabetes Program Recommendations:    Upon transition off insulin drip consider Semglee 20 units -2 hours prior to d/c of insulin drip. Also recommend Novolog sensitive correction tid with meals and HS scale plus Novolog meal coverage 5 units tid with meals (hold if patient eats less than 50%).  Note patient did receive Solumedrol yesterday morning which likely contributed to hyperglycemia.   Thanks,  Adah Perl, RN, BC-ADM Inpatient Diabetes Coordinator Pager [phone]  (8a-5p)

## 2021-06-25 DIAGNOSIS — A419 Sepsis, unspecified organism: Secondary | ICD-10-CM | POA: Diagnosis not present

## 2021-06-25 DIAGNOSIS — R6520 Severe sepsis without septic shock: Secondary | ICD-10-CM | POA: Diagnosis not present

## 2021-06-25 DIAGNOSIS — E872 Acidosis, unspecified: Secondary | ICD-10-CM | POA: Diagnosis not present

## 2021-06-25 LAB — CBC
HCT: 28.5 % — ABNORMAL LOW (ref 39.0–52.0)
Hemoglobin: 9.6 g/dL — ABNORMAL LOW (ref 13.0–17.0)
MCH: 31.4 pg (ref 26.0–34.0)
MCHC: 33.7 g/dL (ref 30.0–36.0)
MCV: 93.1 fL (ref 80.0–100.0)
Platelets: 184 10*3/uL (ref 150–400)
RBC: 3.06 MIL/uL — ABNORMAL LOW (ref 4.22–5.81)
RDW: 14.4 % (ref 11.5–15.5)
WBC: 9 10*3/uL (ref 4.0–10.5)
nRBC: 0.2 % (ref 0.0–0.2)

## 2021-06-25 LAB — GLUCOSE, CAPILLARY
Glucose-Capillary: 152 mg/dL — ABNORMAL HIGH (ref 70–99)
Glucose-Capillary: 197 mg/dL — ABNORMAL HIGH (ref 70–99)
Glucose-Capillary: 222 mg/dL — ABNORMAL HIGH (ref 70–99)
Glucose-Capillary: 238 mg/dL — ABNORMAL HIGH (ref 70–99)
Glucose-Capillary: 256 mg/dL — ABNORMAL HIGH (ref 70–99)
Glucose-Capillary: 290 mg/dL — ABNORMAL HIGH (ref 70–99)
Glucose-Capillary: 56 mg/dL — ABNORMAL LOW (ref 70–99)
Glucose-Capillary: 96 mg/dL (ref 70–99)

## 2021-06-25 LAB — BASIC METABOLIC PANEL
Anion gap: 11 (ref 5–15)
BUN: 22 mg/dL (ref 8–23)
CO2: 25 mmol/L (ref 22–32)
Calcium: 7.6 mg/dL — ABNORMAL LOW (ref 8.9–10.3)
Chloride: 96 mmol/L — ABNORMAL LOW (ref 98–111)
Creatinine, Ser: 3.6 mg/dL — ABNORMAL HIGH (ref 0.61–1.24)
GFR, Estimated: 18 mL/min — ABNORMAL LOW (ref >60)
Glucose, Bld: 278 mg/dL — ABNORMAL HIGH (ref 70–99)
Potassium: 3.9 mmol/L (ref 3.5–5.1)
Sodium: 132 mmol/L — ABNORMAL LOW (ref 135–145)

## 2021-06-25 LAB — HEPARIN LEVEL (UNFRACTIONATED): Heparin Unfractionated: 0.54 IU/mL (ref 0.30–0.70)

## 2021-06-25 MED ORDER — ROPINIROLE HCL 1 MG PO TABS
2.0000 mg | ORAL_TABLET | Freq: Every day | ORAL | Status: DC
  Administered 2021-06-25 – 2021-06-29 (×5): 2 mg via ORAL
  Filled 2021-06-25 (×6): qty 2

## 2021-06-25 MED ORDER — PANTOPRAZOLE SODIUM 40 MG PO TBEC
40.0000 mg | DELAYED_RELEASE_TABLET | Freq: Every day | ORAL | Status: DC
  Administered 2021-06-25 – 2021-06-30 (×6): 40 mg via ORAL
  Filled 2021-06-25 (×6): qty 1

## 2021-06-25 MED ORDER — RENA-VITE PO TABS
1.0000 | ORAL_TABLET | Freq: Every day | ORAL | Status: DC
  Administered 2021-06-25 – 2021-06-29 (×5): 1 via ORAL
  Filled 2021-06-25 (×5): qty 1

## 2021-06-25 MED ORDER — LINACLOTIDE 145 MCG PO CAPS
145.0000 ug | ORAL_CAPSULE | Freq: Every day | ORAL | Status: DC | PRN
  Filled 2021-06-25: qty 1

## 2021-06-25 MED ORDER — DIALYVITE 3000 3 MG PO TABS: 1.0000 | ORAL_TABLET | Freq: Every day | ORAL | Status: DC

## 2021-06-25 MED ORDER — LORATADINE 10 MG PO TABS
10.0000 mg | ORAL_TABLET | Freq: Every day | ORAL | Status: DC
  Administered 2021-06-25 – 2021-06-30 (×6): 10 mg via ORAL
  Filled 2021-06-25 (×6): qty 1

## 2021-06-25 MED ORDER — PREGABALIN 100 MG PO CAPS: 100.0000 mg | ORAL_CAPSULE | Freq: Three times a day (TID) | ORAL | Status: DC

## 2021-06-25 MED ORDER — POLYVINYL ALCOHOL 1.4 % OP SOLN
1.0000 [drp] | Freq: Two times a day (BID) | OPHTHALMIC | Status: DC | PRN
  Filled 2021-06-25: qty 15

## 2021-06-25 MED ORDER — INSULIN GLARGINE-YFGN 100 UNIT/ML ~~LOC~~ SOLN
7.0000 [IU] | Freq: Every day | SUBCUTANEOUS | Status: DC
  Administered 2021-06-26 – 2021-06-30 (×5): 7 [IU] via SUBCUTANEOUS
  Filled 2021-06-25 (×5): qty 0.07

## 2021-06-25 MED ORDER — FAMOTIDINE 20 MG PO TABS
20.0000 mg | ORAL_TABLET | Freq: Every day | ORAL | Status: DC
  Administered 2021-06-25 – 2021-06-30 (×6): 20 mg via ORAL
  Filled 2021-06-25 (×6): qty 1

## 2021-06-25 MED ORDER — TAMSULOSIN HCL 0.4 MG PO CAPS
0.4000 mg | ORAL_CAPSULE | Freq: Every day | ORAL | Status: DC
  Administered 2021-06-25 – 2021-06-30 (×6): 0.4 mg via ORAL
  Filled 2021-06-25 (×6): qty 1

## 2021-06-25 MED ORDER — PNEUMOCOCCAL VAC POLYVALENT 25 MCG/0.5ML IJ INJ
0.5000 mL | INJECTION | INTRAMUSCULAR | Status: DC
  Filled 2021-06-25: qty 0.5

## 2021-06-25 MED ORDER — PREGABALIN 75 MG PO CAPS
75.0000 mg | ORAL_CAPSULE | Freq: Every day | ORAL | Status: DC
  Administered 2021-06-25 – 2021-06-30 (×6): 75 mg via ORAL
  Filled 2021-06-25 (×6): qty 1

## 2021-06-25 MED ORDER — FAMOTIDINE 20 MG PO TABS: 40.0000 mg | ORAL_TABLET | Freq: Every day | ORAL | Status: DC

## 2021-06-25 MED ORDER — POLYVINYL ALCOHOL-POVIDONE PF 1.4-0.6 % OP SOLN: 1.0000 [drp] | Freq: Two times a day (BID) | OPHTHALMIC | Status: DC | PRN

## 2021-06-25 MED ORDER — INFLUENZA VAC A&B SA ADJ QUAD 0.5 ML IM PRSY
0.5000 mL | PREFILLED_SYRINGE | INTRAMUSCULAR | Status: AC
  Administered 2021-06-26: 0.5 mL via INTRAMUSCULAR
  Filled 2021-06-25: qty 0.5

## 2021-06-25 NOTE — Progress Notes (Signed)
PROGRESS NOTE    DIRCK BUTCH  KKX:[id number] DOB: 10-18-1955 DOA: 06/13/2021 PCP: [name], PA-C   Chief Complaint  Patient presents with   Wound Infection   Brief Narrative/Hospital Course:  [name], 65 y.o. 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, Dr. Clyda Greener seen on 10/3 by [name] potential need for transtibial amputation and referred to [name] for PVD/angiogram PAF on Eliquis, ESRD on HD TTS, chronic diastolic CHF, J6RC, HTN, dyslipidemia sent from dialysis center for further evaluation of the wound due to increasing swelling, pain around the penis and the scrotum and had purulent discharge from the urethra and purulent secretion from penile prosthesis. Seen in the ED-found to have sepsis with leukocytosis lactic acidosis.  Urology consulted underwent removal of 3 piece inflatable penile prosthesis 10/5-and patient admitted on IV antibiotics.   Subjective:  Aert awake, no complaints pain controlled. Foley+, surgical site staples +clean dry and intact. RLE with prosthesis  Assessment & Plan:  Severe sepsis due to infected penile prosthesis: S/P removal of 3 piece inflatable penile prosthesis by Dr [name] 10/5.  JP drain has been removed.  Aerobic/anaerobic culture 10/5 shows Enterococcus faecalis sensitive to ampicillin-urine culture 10/5 with same organism.  Blood culture no growth to date.  Repeat urine culture 10/12 no growth.  Off fluconazole, on cefepime/vancomycin.  Intermittent Foley LEAK+, Foley in place to continue for another week per urology.  Urology recommended 1 week of antibiotics.Currently afebrile and no leukocytosis. Recent Labs  Lab 06/21/21 0150 06/22/21 0209 06/23/21 0314 06/24/21 0509 06/25/21 0532  WBC 7.3 8.3 6.7 10.1 9.0    Left foot infection-abnormal signal abnormalities of cuboid worrisome for osteomyelitis,  cellulitis and myositis PVD: Seen by [name] underwent angiogram 10/11 no significant iliofemoral disease patent left SFA stents, 2/3 vessel below the knee runoff to the foot, proximal occluded left AT.Left foot no palpable DP pulse.  Accessible left AT revascularization 10/14 a.m. by Dr. Patwardhan-cardiology arranging for outpatient duplex ultrasound and outpatient follow-up.  On antibiotics as above.  CRP  normal, sed rate 17.  CAD S/P pci Followed by [name].  Currently no chest pain.  Continue Coreg, Lipitor along with Plavix.  IDDM with uncontrolled hyperglycemia, HbA1c 7.9.  Blood sugar fairly controlled on Semglee 20 units and NovoLog  5 u tid and SSI Recent Labs  Lab 06/24/21 1654 06/24/21 1951 06/24/21 2353 06/25/21 0415 06/25/21 0836  GLUCAP 123* 191* 290* 256* 197*    PAF: Rate controlled on Coreg. Eliquis on hold- on heparin drip until no further procedure by orthopedic surgery.    Chronic pain continue fentanyl patch pain management  ESRD on HD TTS: Nephrology on board Next HD 10/18 Hypertension/volume: BP on higher side weight is also up planning for more aggressive EFG next HD.  Continue his amlodipine and Coreg. Metabolic bone disease: Calcium/phosphorus stable continue home binders Anemia of chronic renal disease: On Aranesp q. Tuesday.  Monitor H&H transfuse if less than 7 g. Recent Labs  Lab 06/21/21 0150 06/22/21 0209 06/23/21 0314 06/24/21 0509 06/25/21 0532  HGB 10.5* 9.9* 9.6* 9.2* 9.6*  HCT 30.5* 30.6* 28.6* 27.7* 28.5*    Class 1 Obesity:Patient's Body mass index is 33.32 kg/m. : Will benefit with PCP follow-up, weight loss  healthy lifestyle and outpatient sleep evaluation.  Goals of care palliative care on board remains full code.  DVT  prophylaxis: SCD's Start: 06/23/21 2013 SCD's Start: 06/20/21 1108 SCDs Start: 06/14/21 0047 Code Status:   Code Status: Full Code Family Communication: plan of care discussed with patient at bedside. Status  is: Inpatient  Remains inpatient appropriate because:  for ongoing IV antibiotic use and further recommendation by [name]. Lives at home with wife and son Anticipated disposition to skilled nursing facility hopefully early next week if okay with [name]  Objective: Vitals: Today's Vitals   06/24/21 2328 06/25/21 0320 06/25/21 0401 06/25/21 0417  BP: (!) 134/50 (!) 145/49    Pulse: 60     Resp: 18 16    Temp: 98.8 F (37.1 C) 98.1 F (36.7 C)    TempSrc: Oral Oral    SpO2: 97% 98%    Weight:    117.7 kg  Height:      PainSc:  9  6     Physical Examination: General exam: AA0x3, pleasant. Obese HEENT:Oral mucosa moist, Ear/Nose WNL grossly,dentition normal. Respiratory system: B/l  clear BS, no use of accessory muscle, non tender. Cardiovascular system: S1 & S2 +,no JVD. Gastrointestinal system: Abdomen soft, NT,ND, BS+. Nervous System:Alert, awake, moving extremities. Extremities: edema none, distal peripheral pulses palpable.  RLE prosthesis Skin: No rashes, no icterus. Foley+, surgical site above penile area staples +clean dry and intact. MSK: Normal muscle bulk, tone, power.  Medications reviewed:  Scheduled Meds:  (feeding supplement) PROSource Plus  30 mL Oral BID BM   amLODipine  5 mg Oral Daily   atorvastatin  40 mg Oral QHS   carvedilol  37.5 mg Oral BID WC   Chlorhexidine Gluconate Cloth  6 each Topical Q0600   clopidogrel  75 mg Oral Daily   collagenase   Topical Daily   darbepoetin (ARANESP) injection - DIALYSIS  100 mcg Intravenous Q Tue-HD   fentaNYL  1 patch Transdermal Q72H   insulin aspart  0-5 Units Subcutaneous QHS   insulin aspart  0-9 Units Subcutaneous TID WC   insulin aspart  4 Units Subcutaneous TID WC   insulin glargine-yfgn  20 Units Subcutaneous Daily   oxybutynin  5 mg Oral TID   sodium chloride flush  3 mL Intravenous Q12H   sodium chloride flush  3 mL Intravenous Q12H   Continuous Infusions:  sodium chloride     sodium chloride      ceFEPime (MAXIPIME) IV 2 g (06/24/21 1807)   dextrose 5% lactated ringers Stopped (06/24/21 1154)   heparin 2,100 Units/hr (06/24/21 1813)   insulin Stopped (06/24/21 1155)   vancomycin 1,000 mg (06/24/21 1431)   Diet Order             Diet renal/carb modified with fluid restriction Diet-HS Snack? Nothing; Fluid restriction: 1200 mL Fluid; Room service appropriate? Yes; Fluid consistency: Thin  Diet effective now                   Intake/Output  Intake/Output Summary (Last 24 hours) at 06/25/2021 0817 Last data filed at 06/25/2021 0600 Gross per 24 hour  Intake 320 ml  Output 2224 ml  Net -1904 ml   Intake/Output from previous day: 10/15 0701 - 10/16 0700 In: 320 [P.O.:320] Out: 2224 [Urine:240] Net IO Since Admission: -4,853.95 mL [06/25/21 0817]   Weight change: -1 kg  Wt Readings from Last 3 Encounters:  06/25/21 117.7 kg  06/12/21 108.4 kg  05/01/21 94.8 kg     Consultants:see note  Procedures:see note Antimicrobials: Anti-infectives (From admission, onward)    Start  Dose/Rate Route Frequency Ordered Stop   06/15/21 2200  fluconazole (DIFLUCAN) IVPB 200 mg  Status:  Discontinued        200 mg 100 mL/hr over 60 Minutes Intravenous Daily at bedtime 06/14/21 0017 06/17/21 0716   06/15/21 1800  ceFEPIme (MAXIPIME) 2 g in sodium chloride 0.9 % 100 mL IVPB        2 g 200 mL/hr over 30 Minutes Intravenous Every T-Th-Sa (1800) 06/14/21 0922     06/15/21 1200  ceFEPIme (MAXIPIME) 2 g in sodium chloride 0.9 % 100 mL IVPB  Status:  Discontinued        2 g 200 mL/hr over 30 Minutes Intravenous Every T-Th-Sa (Hemodialysis) 06/14/21 0025 06/14/21 0922   06/15/21 1200  vancomycin (VANCOCIN) IVPB 1000 mg/200 mL premix        1,000 mg 200 mL/hr over 60 Minutes Intravenous Every T-Th-Sa (Hemodialysis) 06/14/21 0025     06/14/21 1000  metroNIDAZOLE (FLAGYL) IVPB 500 mg        500 mg 100 mL/hr over 60 Minutes Intravenous Every 12 hours 06/14/21 0025 06/21/21 0959    06/14/21 0030  fluconazole (DIFLUCAN) IVPB 400 mg        400 mg 100 mL/hr over 120 Minutes Intravenous STAT 06/14/21 0017 06/14/21 0400   06/13/21 2048  vancomycin variable dose per unstable renal function (pharmacist dosing)  Status:  Discontinued         Does not apply See admin instructions 06/13/21 2048 06/14/21 0030   06/13/21 2030  ceFEPIme (MAXIPIME) 2 g in sodium chloride 0.9 % 100 mL IVPB        2 g 200 mL/hr over 30 Minutes Intravenous  Once 06/13/21 2017 06/13/21 2145   06/13/21 2030  metroNIDAZOLE (FLAGYL) IVPB 500 mg        500 mg 100 mL/hr over 60 Minutes Intravenous  Once 06/13/21 2017 06/13/21 2157   06/13/21 2030  vancomycin (VANCOREADY) IVPB 2000 mg/400 mL        2,000 mg 200 mL/hr over 120 Minutes Intravenous  Once 06/13/21 2017 06/14/21 0107      Culture/Microbiology    Component Value Date/Time   SDES URINE, CATHETERIZED 06/21/2021 1416   SPECREQUEST NONE 06/21/2021 1416   CULT  06/21/2021 1416    NO GROWTH Performed at [facility], [place] [address], [place], [place] [address]    REPTSTATUS 06/22/2021 FINAL 06/21/2021 1416    Other culture-see note  Unresulted Labs (From admission, onward)     Start     Ordered   06/23/21 1214  Heparin level (unfractionated)  Daily,   R     Question:  Specimen collection method  Answer:  Lab=Lab collect   06/23/21 1213   06/22/21 0263  Basic metabolic panel  Daily,   R     Question:  Specimen collection method  Answer:  Lab=Lab collect   06/21/21 1839   06/17/21 0500  CBC  Daily,   R     Question:  Specimen collection method  Answer:  Lab=Lab collect   06/16/21 1613           Data Reviewed: I have personally reviewed following labs and imaging studies CBC: Recent Labs  Lab 06/21/21 0150 06/22/21 0209 06/23/21 0314 06/24/21 0509 06/25/21 0532  WBC 7.3 8.3 6.7 10.1 9.0  HGB 10.5* 9.9* 9.6* 9.2* 9.6*  HCT 30.5* 30.6* 28.6* 27.7* 28.5*  MCV 89.2 92.7 92.0 91.4 93.1  PLT 237 237 200 212 184  Basic  Metabolic Panel: Recent Labs  Lab 06/21/21 0150 06/22/21 0209 06/23/21 2102 06/24/21 0106 06/24/21 0509 06/24/21 0743 06/25/21 0532  NA 131*   < > 129* 129* 129* 133* 132*  K 3.9   < > 5.0 4.9 4.4 4.4 3.9  CL 97*   < > 93* 94* 95* 97* 96*  CO2 25   < > 21* 23 21* 25 25  GLUCOSE 216*   < > 545* 412* 216* 146* 278*  BUN 16   < > 33* 34* 36* 36* 22  CREATININE 3.29*   < > 4.57* 4.70* 4.67* 4.76* 3.60*  CALCIUM 8.0*   < > 8.0* 8.2* 8.2* 8.3* 7.6*  PHOS 3.7  --   --   --   --   --   --    < > = values in this interval not displayed.   GFR: Estimated Creatinine Clearance: 27.1 mL/min (A) (by C-G formula based on SCr of 3.6 mg/dL (H)). Liver Function Tests: Recent Labs  Lab 06/21/21 0150  ALBUMIN 2.5*   No results for input(s): LIPASE, AMYLASE in the last 168 hours. No results for input(s): AMMONIA in the last 168 hours. Coagulation Profile: No results for input(s): INR, PROTIME in the last 168 hours. Cardiac Enzymes: No results for input(s): CKTOTAL, CKMB, CKMBINDEX, TROPONINI in the last 168 hours. BNP (last 3 results) No results for input(s): PROBNP in the last 8760 hours. HbA1C: No results for input(s): HGBA1C in the last 72 hours. CBG: Recent Labs  Lab 06/24/21 1112 06/24/21 1654 06/24/21 1951 06/24/21 2353 06/25/21 0415  GLUCAP 153* 123* 191* 290* 256*   Lipid Profile: No results for input(s): CHOL, HDL, LDLCALC, TRIG, CHOLHDL, LDLDIRECT in the last 72 hours. Thyroid Function Tests: No results for input(s): TSH, T4TOTAL, FREET4, T3FREE, THYROIDAB in the last 72 hours. Anemia Panel: No results for input(s): VITAMINB12, FOLATE, FERRITIN, TIBC, IRON, RETICCTPCT in the last 72 hours. Sepsis Labs: No results for input(s): PROCALCITON, LATICACIDVEN in the last 168 hours.  Recent Results (from the past 240 hour(s))  Urine Culture     Status: None   Collection Time: 06/21/21  2:16 PM   Specimen: Urine, Catheterized  Result Value Ref Range Status   Specimen  Description URINE, CATHETERIZED  Final   Special Requests NONE  Final   Culture   Final    NO GROWTH Performed at [facility], [address], [place], [place] [address]    Report Status 06/22/2021 FINAL  Final     Radiology Studies: No results found.   LOS: 11 days   Antonieta Pert, MD [facility]  06/25/2021, 8:17 AM

## 2021-06-25 NOTE — Progress Notes (Signed)
[facility] for heparin Indication: atrial fibrillation  Allergies  Allergen Reactions   Contrast Media [Iodinated Diagnostic Agents]     Difficulty breathing     Ivp Dye [Iodinated Diagnostic Agents] Anaphylaxis and Other (See Comments)    Breathing problems, altered mental state    Adhesive [Tape] Rash    Rash after 3-4 days use    Patient Measurements: Height: 6\' 2"  (188 cm) Weight: 117.7 kg (259 lb 7.7 oz) IBW/kg (Calculated) : 82.2 Heparin Dosing Weight: 105 kg  Vital Signs: Temp: 98.1 F (36.7 C) (10/16 0320) Temp Source: Oral (10/16 0320) BP: 145/49 (10/16 0320) Pulse Rate: 60 (10/15 2328)  Labs: Recent Labs    06/23/21 0314 06/23/21 1149 06/24/21 0509 06/24/21 0743 06/24/21 1134 06/25/21 0532  HGB 9.6*  --  9.2*  --   --  9.6*  HCT 28.6*  --  27.7*  --   --  28.5*  PLT 200  --  212  --   --  184  HEPARINUNFRC 0.27*   < > 0.53  --  0.67 0.54  CREATININE 3.55*   < > 4.67* 4.76*  --  3.60*   < > = values in this interval not displayed.     Estimated Creatinine Clearance: 27.1 mL/min (A) (by C-G formula based on SCr of 3.6 mg/dL (H)).   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   LEFT CAROTID ENDARTERECTOMY   Chronic kidney disease    Complication of anesthesia    BP WENT UP AT [facility] "   COPD (chronic obstructive pulmonary disease) ([place])    pt denies this dx as of 06/01/20 - no inhaler    Diabetes mellitus without complication ([place])    Diverticulitis    Diverticulosis of colon (without mention of hemorrhage)    DJD (degenerative joint disease)    knees/hands/feet/back/neck   Fatty liver    Full dentures    GERD (gastroesophageal reflux disease)    H/O hiatal hernia    History of blood transfusion    with a past surical procedure per patient 06/01/20   Hyperlipidemia    Hypertension    Neuromuscular disorder ([place])    peripheral neuropathy   Non-pressure  chronic ulcer of other part of left foot limited to breakdown of skin ([place]) 11/12/2016   Osteomyelitis (HCC)    left 5th metatarsal   PAD (peripheral artery disease) ([place])    Distal aortogram June 2012. Atherectomy left popliteal artery July 2012.    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 Now s/p revascularization of occluded left AT on 10/14 in the setting of  L foot osteomyelitis and plans for possible procedures. He is noted with ESRD on HD. Pharmacy consulted for heparin dosing - heparin gtt restarted post-op.  Heparin level of 0.54 is therapeutic on heparin infusion of 2100 units/hr. CBC stable - Hgb low but stable. No bleeding noted.  Goal of Therapy:  Heparin level 0.3-0.7 units/ml Monitor platelets by anticoagulation protocol: Yes   Plan:  Continue heparin at 2100 units/hr Daily heparin level, CBC Monitor for s/sx of bleeding  Laurey Arrow, PharmD PGY1  Pharmacy Resident 06/25/2021  8:30 AM  Please check [email or web address] for unit-specific pharmacy phone numbers.

## 2021-06-25 NOTE — Progress Notes (Signed)
[facility] Progress Note   Subjective:  Seen in room - off insulin drip, BS doing better. Dialyzed yesterday - did ok with 1.9L net UF. No CP/dyspnea today.  Objective Vitals:   06/24/21 2328 06/25/21 0320 06/25/21 0417 06/25/21 0831  BP: (!) 134/50 (!) 145/49  (!) 153/55  Pulse: 60   63  Resp: 18 16  15   Temp: 98.8 F (37.1 C) 98.1 F (36.7 C)  98.1 F (36.7 C)  TempSrc: Oral Oral  Oral  SpO2: 97% 98%  97%  Weight:   117.7 kg   Height:       Physical Exam General: Well appearing man, NAD Heart: RRR; no murmur Lungs: CTA anteriorly Abdomen: soft GU: Foley in place, suprapubic staples in place Extremities: R BKA, no LLE edema Dialysis Access: LUE AVF + thrill  Additional Objective Labs: Basic Metabolic Panel: Recent Labs  Lab 06/21/21 0150 06/22/21 0209 06/24/21 0509 06/24/21 0743 06/25/21 0532  NA 131*   < > 129* 133* 132*  K 3.9   < > 4.4 4.4 3.9  CL 97*   < > 95* 97* 96*  CO2 25   < > 21* 25 25  GLUCOSE 216*   < > 216* 146* 278*  BUN 16   < > 36* 36* 22  CREATININE 3.29*   < > 4.67* 4.76* 3.60*  CALCIUM 8.0*   < > 8.2* 8.3* 7.6*  PHOS 3.7  --   --   --   --    < > = values in this interval not displayed.   Liver Function Tests: Recent Labs  Lab 06/21/21 0150  ALBUMIN 2.5*   CBC: Recent Labs  Lab 06/21/21 0150 06/22/21 0209 06/23/21 0314 06/24/21 0509 06/25/21 0532  WBC 7.3 8.3 6.7 10.1 9.0  HGB 10.5* 9.9* 9.6* 9.2* 9.6*  HCT 30.5* 30.6* 28.6* 27.7* 28.5*  MCV 89.2 92.7 92.0 91.4 93.1  PLT 237 237 200 212 184   Medications:  sodium chloride     sodium chloride     ceFEPime (MAXIPIME) IV 2 g (06/24/21 1807)   dextrose 5% lactated ringers Stopped (06/24/21 1154)   heparin 2,100 Units/hr (06/24/21 1813)   insulin Stopped (06/24/21 1155)   vancomycin 1,000 mg (06/24/21 1431)    (feeding supplement) PROSource Plus  30 mL Oral BID BM   amLODipine  5 mg Oral Daily   atorvastatin  40 mg Oral QHS   carvedilol  37.5 mg Oral  BID WC   Chlorhexidine Gluconate Cloth  6 each Topical Q0600   clopidogrel  75 mg Oral Daily   collagenase   Topical Daily   darbepoetin (ARANESP) injection - DIALYSIS  100 mcg Intravenous Q Tue-HD   fentaNYL  1 patch Transdermal Q72H   insulin aspart  0-5 Units Subcutaneous QHS   insulin aspart  0-9 Units Subcutaneous TID WC   insulin aspart  4 Units Subcutaneous TID WC   insulin glargine-yfgn  20 Units Subcutaneous Daily   oxybutynin  5 mg Oral TID   sodium chloride flush  3 mL Intravenous Q12H   sodium chloride flush  3 mL Intravenous Q12H    Dialysis Orders: [facility]   4h 60min  450/800  108kg  2/2 bath  AVF  Hep none  - Mircera 112mcg IV q 2 wks (last 9/27) - Hectoral 44mcg IV q HD   Assessment/Plan:  L foot infection + penile prosthesis infection: prosthesis removed by urology on 10/5. Xray concerning for osteomyelitis of  L foot.  Urine cx growing enterococcus faecalis. Remains on Vanc/Cefepime.   ESRD: Continue usual HD TTS schedule - next 10/18.  HTN/volume: BP slightly high, weight up this admit - will plan on more aggressive UFG.  Anemia: Hgb 9.6 - continue Aranesp q Tues   Metabolic bone disease: CorrCa/Phos ok, continue home binders  Nutrition:  Alb low, continue protein supplements.  T2DM: Hyperglycemia overnight and on insulin drip - glucose improving.  PAD: Arteriogram 10/11, s/p PTA to anterior tibial artery 10/4 with cardiology.    Veneta Penton, PA-C 06/25/2021, 9:16 AM  [employer]

## 2021-06-25 NOTE — Progress Notes (Signed)
Pharmacy Antibiotic Note [name] is a 65 y.o. male continues on broad spectrum antibiotics for non-healing ulcer of left heal now s/p revascularization 10/14 and infected penile prothesis that was removed 10/5.  With ESRD - HD on T,Th,Sat (next 10/18) Blood cultures negative Penile wound culture & urine culture: E. Faecalis (pan sensitive) WBC 9, afebrile  Plan: Continue Vancomycin 1 gram IV Q HD Cefepime 2 grams IV Q HD Follow LOT for antibiotics - OK from urology standpoint. F/u on [name] recs  Temp (24hrs), Avg:98.5 F (36.9 C), Min:98.1 F (36.7 C), Max:98.8 F (37.1 C)  Recent Labs  Lab 06/21/21 0150 06/22/21 0209 06/23/21 0314 06/23/21 2102 06/24/21 0106 06/24/21 0509 06/24/21 0743 06/25/21 0532  WBC 7.3 8.3 6.7  --   --  10.1  --  9.0  CREATININE 3.29* 4.71* 3.55* 4.57* 4.70* 4.67* 4.76* 3.60*  VANCORANDOM  --  18  --   --   --   --   --   --     Estimated Creatinine Clearance: 27.1 mL/min (A) (by C-G formula based on SCr of 3.6 mg/dL (H)).    Allergies  Allergen Reactions   Contrast Media [Iodinated Diagnostic Agents]     Difficulty breathing     Ivp Dye [Iodinated Diagnostic Agents] Anaphylaxis and Other (See Comments)    Breathing problems, altered mental state    Adhesive [Tape] Rash    Rash after 3-4 days use    Antimicrobials this admission: Fluconazole 10/5 > 10/8  Cefepime 10/4 > Flagyl 10/4 >  Vancomycin 10/4>   Dose adjustments this admission: none  Microbiology results: 10/4 BCx: NGF 10/5 fungus Cx: pending 10/5 UCx: E. Faecalis (pan-sensitive) 10/5 Surgical PCR: MRSA 10/5 Penis Cx: E. Faecalis (pan sensitive) 10/12 UCx: NGF  Thank you for allowing pharmacy to be a part of this patient's care.  Laurey Arrow, PharmD PGY1 Pharmacy Resident 06/25/2021  8:36 AM  Please check [email or web address] for unit-specific pharmacy phone numbers.

## 2021-06-26 ENCOUNTER — Other Ambulatory Visit: Payer: Self-pay | Admitting: Cardiology

## 2021-06-26 DIAGNOSIS — R6520 Severe sepsis without septic shock: Secondary | ICD-10-CM | POA: Diagnosis not present

## 2021-06-26 DIAGNOSIS — E872 Acidosis, unspecified: Secondary | ICD-10-CM | POA: Diagnosis not present

## 2021-06-26 DIAGNOSIS — A419 Sepsis, unspecified organism: Secondary | ICD-10-CM | POA: Diagnosis not present

## 2021-06-26 LAB — GLUCOSE, CAPILLARY
Glucose-Capillary: 141 mg/dL — ABNORMAL HIGH (ref 70–99)
Glucose-Capillary: 178 mg/dL — ABNORMAL HIGH (ref 70–99)
Glucose-Capillary: 207 mg/dL — ABNORMAL HIGH (ref 70–99)
Glucose-Capillary: 230 mg/dL — ABNORMAL HIGH (ref 70–99)
Glucose-Capillary: 240 mg/dL — ABNORMAL HIGH (ref 70–99)
Glucose-Capillary: 264 mg/dL — ABNORMAL HIGH (ref 70–99)

## 2021-06-26 LAB — CBC
HCT: 31.7 % — ABNORMAL LOW (ref 39.0–52.0)
Hemoglobin: 10.3 g/dL — ABNORMAL LOW (ref 13.0–17.0)
MCH: 30.4 pg (ref 26.0–34.0)
MCHC: 32.5 g/dL (ref 30.0–36.0)
MCV: 93.5 fL (ref 80.0–100.0)
Platelets: 184 10*3/uL (ref 150–400)
RBC: 3.39 MIL/uL — ABNORMAL LOW (ref 4.22–5.81)
RDW: 14.7 % (ref 11.5–15.5)
WBC: 8.8 10*3/uL (ref 4.0–10.5)
nRBC: 0 % (ref 0.0–0.2)

## 2021-06-26 LAB — BASIC METABOLIC PANEL
Anion gap: 10 (ref 5–15)
BUN: 27 mg/dL — ABNORMAL HIGH (ref 8–23)
CO2: 25 mmol/L (ref 22–32)
Calcium: 7.8 mg/dL — ABNORMAL LOW (ref 8.9–10.3)
Chloride: 97 mmol/L — ABNORMAL LOW (ref 98–111)
Creatinine, Ser: 4.08 mg/dL — ABNORMAL HIGH (ref 0.61–1.24)
GFR, Estimated: 15 mL/min — ABNORMAL LOW (ref >60)
Glucose, Bld: 232 mg/dL — ABNORMAL HIGH (ref 70–99)
Potassium: 4.8 mmol/L (ref 3.5–5.1)
Sodium: 132 mmol/L — ABNORMAL LOW (ref 135–145)

## 2021-06-26 LAB — HEPARIN LEVEL (UNFRACTIONATED): Heparin Unfractionated: 0.58 IU/mL (ref 0.30–0.70)

## 2021-06-26 MED FILL — Heparin Sod (Porcine)-NaCl IV Soln 1000 Unit/500ML-0.9%: INTRAVENOUS | Qty: 1000 | Status: AC

## 2021-06-26 MED FILL — Nitroglycerin IV Soln 100 MCG/ML in D5W: INTRA_ARTERIAL | Qty: 10 | Status: AC

## 2021-06-26 MED FILL — Lidocaine HCl Local Preservative Free (PF) Inj 1%: INTRAMUSCULAR | Qty: 30 | Status: AC

## 2021-06-26 NOTE — Progress Notes (Signed)
ANTICOAGULATION CONSULT NOTE  Pharmacy Consult for Heparin Indication: atrial fibrillation  Allergies  Allergen Reactions   Contrast Media [Iodinated Diagnostic Agents]     Difficulty breathing     Ivp Dye [Iodinated Diagnostic Agents] Anaphylaxis and Other (See Comments)    Breathing problems, altered mental state    Adhesive [Tape] Rash    Rash after 3-4 days use    Patient Measurements: Height: 6\' 2"  (188 cm) Weight: 118.2 kg (260 lb 9.3 oz) IBW/kg (Calculated) : 82.2 Heparin Dosing Weight: 105 kg  Vital Signs: Temp: 99.5 F (37.5 C) (10/17 0801) Temp Source: Oral (10/17 0801) BP: 140/49 (10/17 0801) Pulse Rate: 60 (10/17 0801)  Labs: Recent Labs    06/24/21 0509 06/24/21 0743 06/24/21 1134 06/25/21 0532 06/26/21 0002  HGB 9.2*  --   --  9.6* 10.3*  HCT 27.7*  --   --  28.5* 31.7*  PLT 212  --   --  184 184  HEPARINUNFRC 0.53  --  0.67 0.54 0.58  CREATININE 4.67* 4.76*  --  3.60* 4.08*    Estimated Creatinine Clearance: 24 mL/min (A) (by C-G formula based on SCr of 4.08 mg/dL (H)).   Medications:  Scheduled:   (feeding supplement) PROSource Plus  30 mL Oral BID BM   amLODipine  5 mg Oral Daily   atorvastatin  40 mg Oral QHS   Chlorhexidine Gluconate Cloth  6 each Topical Q0600   clopidogrel  75 mg Oral Daily   collagenase   Topical Daily   darbepoetin (ARANESP) injection - DIALYSIS  100 mcg Intravenous Q Tue-HD   famotidine  20 mg Oral Daily   fentaNYL  1 patch Transdermal Q72H   influenza vaccine adjuvanted  0.5 mL Intramuscular Tomorrow-1000   insulin aspart  0-5 Units Subcutaneous QHS   insulin aspart  0-9 Units Subcutaneous TID WC   insulin glargine-yfgn  7 Units Subcutaneous Daily   loratadine  10 mg Oral Daily   multivitamin  1 tablet Oral QHS   oxybutynin  5 mg Oral TID   pantoprazole  40 mg Oral Daily   pneumococcal 23 valent vaccine  0.5 mL Intramuscular Tomorrow-1000   pregabalin  75 mg Oral Daily   rOPINIRole  2 mg Oral QHS   sodium  chloride flush  3 mL Intravenous Q12H   sodium chloride flush  3 mL Intravenous Q12H   tamsulosin  0.4 mg Oral Daily   Infusions:   sodium chloride     sodium chloride     ceFEPime (MAXIPIME) IV Stopped (06/24/21 1838)   dextrose 5% lactated ringers Stopped (06/24/21 1146)   heparin 2,100 Units/hr (06/26/21 0926)   insulin Stopped (06/24/21 1146)   vancomycin Stopped (06/25/21 2103)    Assessment: 65 yo male on apixaban PTA for Afib (last dose taken 10/03). Now s/p revascularization of occluded left AT on 10/14 in the setting of  L foot osteomyelitis and plans for possible procedures. Pharmacy consulted for heparin dosing.   Heparin level of 0.58 remains therapeutic on heparin infusion of 2100 units/hr. CBC stable, no s/sx bleeding per chart review. Continue on current heparin rate.  Goal of Therapy:  Heparin level 0.3-0.7 units/ml Monitor platelets by anticoagulation protocol: Yes   Plan:  Continue heparin at 2100 units/hr Check heparin level daily while on heparin Continue to monitor H&H and platelets F/u transition back to apixaban when able   Thank you for allowing pharmacy to be a part of this patient's care.  Ardyth Harps, PharmD Clinical  Pharmacist

## 2021-06-26 NOTE — Progress Notes (Signed)
The patient's heart rate was ranging from mid 40s to 50s. He complained of being cold and he would sweat on and off.  BP was 138/58 and his blood glucose was 238.  Informed the on call physician, who discontinued the ordered Coreg and Labetalol medicines.  Will continue to monitor.  Lupita Dawn, RN

## 2021-06-26 NOTE — Progress Notes (Signed)
Physical Therapy Treatment Patient Details Name: [name] MRN: [id number] DOB: [birth year] Today's Date: 06/26/2021   History of Present Illness Pt is 65 yo male admitted on 06/13/21 with L foot infection, infected penile prosthesis, and sepsis.  He is s/p removal of penile prosthesis on 06/14/21.   Also, s/p L AT revascularization on 10/14. 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 and [name], paroxysmal A. fib on Eliquis, ESRD on hemodialysis TTS, chronic diastolic heart failure, diabetes type 2, hypertension, dyslipidemia    PT Comments    Pt more agreeable and making progress with therapy today. He was able to transfer to EOB with supervision and min A to stand but only able to take 1 side step.  Considering pt in bed for 13 days - good progress today. Continue plan of care.     Recommendations for follow up therapy are one component of a multi-disciplinary discharge planning process, led by the attending physician.  Recommendations may be updated based on patient status, additional functional criteria and insurance authorization.  Follow Up Recommendations  SNF     Equipment Recommendations  Rolling walker with 5" wheels    Recommendations for Other Services       Precautions / Restrictions Precautions Precautions: Fall     Mobility  Bed Mobility Overal bed mobility: Needs Assistance Bed Mobility: Rolling;Sidelying to Sit Rolling: Supervision Sidelying to sit: Min guard;HOB elevated            Transfers Overall transfer level: Needs assistance Equipment used: Rolling walker (2 wheeled) Transfers: Sit to/from Stand Sit to Stand: Min assist;+2 safety/equipment         General transfer comment: Performed x 2 with good initation but min A of 2 to complete rise to standing  Ambulation/Gait Ambulation/Gait assistance: Min assist Gait  Distance (Feet): 1 Feet Assistive device: Rolling walker (2 wheeled) Gait Pattern/deviations: Step-to pattern;Decreased stride length Gait velocity: decreased   General Gait Details: Side step to [facility] with cues for RW and weight shift   Stairs             Wheelchair Mobility    Modified Rankin (Stroke Patients Only)       Balance Overall balance assessment: Needs assistance Sitting-balance support: No upper extremity supported Sitting balance-Leahy Scale: Normal     Standing balance support: Bilateral upper extremity supported Standing balance-Leahy Scale: Poor Standing balance comment: Requiring RW and min guard ; stood for ~30 seconds at a time                            Cognition Arousal/Alertness: Awake/alert Behavior During Therapy: WFL for tasks assessed/performed Overall Cognitive Status: Within Functional Limits for tasks assessed                                 General Comments: Pt more conversive today and responding to questions with icnreased detail      Exercises      General Comments        Pertinent Vitals/Pain Pain Assessment: 0-10 Pain Score: 8  Pain Location: neck, back , legs - chronic Pain Descriptors / Indicators: Discomfort Pain Intervention(s): Limited activity within patient's tolerance;Monitored during session    Home Living  Prior Function            PT Goals (current goals can now be found in the care plan section) Progress towards PT goals: Progressing toward goals    Frequency    Min 2X/week      PT Plan Current plan remains appropriate    Co-evaluation              AM-PAC PT "6 Clicks" Mobility   Outcome Measure  Help needed turning from your back to your side while in a flat bed without using bedrails?: A Little Help needed moving from lying on your back to sitting on the side of a flat bed without using bedrails?: A Little Help needed moving to  and from a bed to a chair (including a wheelchair)?: A Little Help needed standing up from a chair using your arms (e.g., wheelchair or bedside chair)?: A Little Help needed to walk in hospital room?: A Lot Help needed climbing 3-5 steps with a railing? : Total 6 Click Score: 15    End of Session Equipment Utilized During Treatment: Gait belt Activity Tolerance: Patient tolerated treatment well Patient left: in bed;with call bell/phone within reach;with bed alarm set (Once sitting pt wanted to stay at EOB - pt with good balance with sitting) Nurse Communication: Mobility status PT Visit Diagnosis: Other abnormalities of gait and mobility (R26.89);Muscle weakness (generalized) (M62.81)     Time: 1525-1550 PT Time Calculation (min) (ACUTE ONLY): 25 min  Charges:  $Therapeutic Activity: 23-37 mins                     Abran Richard, PT Acute Rehab Services Pager [phone] [facility] [place] 06/26/2021, 4:32 PM

## 2021-06-26 NOTE — TOC Progression Note (Addendum)
Transition of Care [place]) - Progression Note    Patient Details  Name: [name] MRN: [id number] Date of Birth: [birth year]  Transition [facility] Care [facility]) CM/SW [place], [place] Phone Number: 06/26/2021, 2:49 PM  Clinical Narrative:     CSW called patient's wife - gave the only bed offer - [place]- will need to confirmed bed offer( especially dialysis) if the patient and family decides on SNF placement  CSW informed SNF -will keep them posted on if patient and family wants SNF. CSW will continue to follow and assist with discharge planning.  Thurmond Butts, MSW, LCSW Clinical Social Worker    Expected Discharge Plan: Skilled Nursing Facility Barriers to Discharge: Continued Medical Work up, Ship broker, SNF Pending bed offer  Expected Discharge Plan and Services Expected Discharge Plan: [place] In-house Referral: Clinical Social Work     Living arrangements for the past 2 months: Single Family Home                                       Social Determinants of Health (SDOH) Interventions    Readmission Risk Interventions No flowsheet data found.

## 2021-06-26 NOTE — Progress Notes (Signed)
[facility] Progress Note   Subjective:  Seen in room, no c/o today  Objective Vitals:   06/25/21 2154 06/26/21 0006 06/26/21 0418 06/26/21 0801  BP: (!) 138/58 (!) 159/59 (!) 131/45 (!) 140/49  Pulse: (!) 52 (!) 55 (!) 54 60  Resp: 15 15 17 20   Temp: 97.7 F (36.5 C) 97.7 F (36.5 C) 97.7 F (36.5 C) 99.5 F (37.5 C)  TempSrc: Oral Oral Oral Oral  SpO2: 100% 100% 100% 100%  Weight:   118.2 kg   Height:       Physical Exam General: Well appearing man, NAD Heart: RRR; no murmur Lungs: CTA anteriorly Abdomen: soft GU: Foley in place, suprapubic staples in place Extremities: R BKA, no LLE edema Dialysis Access: LUE AVF + thrill  Additional Objective Labs: Basic Metabolic Panel: Recent Labs  Lab 06/21/21 0150 06/22/21 0209 06/24/21 0743 06/25/21 0532 06/26/21 0002  NA 131*   < > 133* 132* 132*  K 3.9   < > 4.4 3.9 4.8  CL 97*   < > 97* 96* 97*  CO2 25   < > 25 25 25   GLUCOSE 216*   < > 146* 278* 232*  BUN 16   < > 36* 22 27*  CREATININE 3.29*   < > 4.76* 3.60* 4.08*  CALCIUM 8.0*   < > 8.3* 7.6* 7.8*  PHOS 3.7  --   --   --   --    < > = values in this interval not displayed.    Liver Function Tests: Recent Labs  Lab 06/21/21 0150  ALBUMIN 2.5*    CBC: Recent Labs  Lab 06/22/21 0209 06/23/21 0314 06/24/21 0509 06/25/21 0532 06/26/21 0002  WBC 8.3 6.7 10.1 9.0 8.8  HGB 9.9* 9.6* 9.2* 9.6* 10.3*  HCT 30.6* 28.6* 27.7* 28.5* 31.7*  MCV 92.7 92.0 91.4 93.1 93.5  PLT 237 200 212 184 184    Medications:  sodium chloride     sodium chloride     ceFEPime (MAXIPIME) IV Stopped (06/24/21 1838)   dextrose 5% lactated ringers Stopped (06/24/21 1146)   heparin 2,100 Units/hr (06/26/21 0926)   insulin Stopped (06/24/21 1146)   vancomycin Stopped (06/25/21 2103)    (feeding supplement) PROSource Plus  30 mL Oral BID BM   amLODipine  5 mg Oral Daily   atorvastatin  40 mg Oral QHS   Chlorhexidine Gluconate Cloth  6 each Topical Q0600    clopidogrel  75 mg Oral Daily   collagenase   Topical Daily   darbepoetin (ARANESP) injection - DIALYSIS  100 mcg Intravenous Q Tue-HD   famotidine  20 mg Oral Daily   fentaNYL  1 patch Transdermal Q72H   influenza vaccine adjuvanted  0.5 mL Intramuscular Tomorrow-1000   insulin aspart  0-5 Units Subcutaneous QHS   insulin aspart  0-9 Units Subcutaneous TID WC   insulin glargine-yfgn  7 Units Subcutaneous Daily   loratadine  10 mg Oral Daily   multivitamin  1 tablet Oral QHS   oxybutynin  5 mg Oral TID   pantoprazole  40 mg Oral Daily   pneumococcal 23 valent vaccine  0.5 mL Intramuscular Tomorrow-1000   pregabalin  75 mg Oral Daily   rOPINIRole  2 mg Oral QHS   sodium chloride flush  3 mL Intravenous Q12H   sodium chloride flush  3 mL Intravenous Q12H   tamsulosin  0.4 mg Oral Daily    Dialysis Orders: TTS [place]   4h 17min  450/800  108kg  2/2 bath  AVF  Hep none  - Mircera 135mcg IV q 2 wks (last 9/27) - Hectoral 31mcg IV q HD   Assessment/Plan:  L foot infection + penile prosthesis infection/ UTI: prosthesis removed by urology on 10/5. 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  Urine cx growing enterococcus faecalis. Remains on Vanc/Cefepime.   ESRD: Continue usual HD TTS schedule - next 10/18.  HTN/volume: BP wnl, weights up - will plan on more aggressive UFG.  Anemia: Hgb 9.6 - continue Aranesp q Tues   Metabolic bone disease: CorrCa/Phos ok, continue home binders  Nutrition:  Alb low, continue protein supplements.  T2DM: Hyperglycemia overnight and on insulin drip - glucose improving.  PAD: Arteriogram 10/11, s/p PTA to anterior tibial artery 10/4 with cardiology.   Kelly Splinter, MD 06/26/2021, 12:00 PM

## 2021-06-26 NOTE — Progress Notes (Signed)
PROGRESS NOTE    [name]  NID:[id number] DOB: 1955-11-13 DOA: 06/13/2021 PCP: [name], PA-C   Chief Complaint  Patient presents with   Wound Infection   Brief Narrative/Hospital Course:  [name], 65 y.o. 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, Dr. Clyda Greener seen on 10/3 by [name] potential need for transtibial amputation and referred to [name] for PVD/angiogram PAF on Eliquis, ESRD on HD TTS, chronic diastolic CHF, R4ER, HTN, dyslipidemia sent from dialysis center for further evaluation of the wound due to increasing swelling, pain around the penis and the scrotum and had purulent discharge from the urethra and purulent secretion from penile prosthesis. Seen in the ED-found to have sepsis with leukocytosis lactic acidosis.  Urology consulted underwent removal of 3 piece inflatable penile prosthesis 10/5-and patient admitted on IV antibiotics.   Subjective: Seen and examined.  Alert awake not in distress. RLE with prosthesis Left foot wound  is dry mildly tender no drainage Assessment & Plan:  Severe sepsis due to infected penile prosthesis: S/P removal of 3 piece inflatable penile prosthesis by Dr [name] 10/5.  JP drain has been removed.  Aerobic/anaerobic culture from 10/5 shows Enterococcus faecalis sensitive to ampicillin-urine culture 10/5 with same organism.  Blood culture no growth to date.  Repeat urine culture 10/12 no growth.  Off fluconazole, on cefepime/vancomycin.Intermittent Foley LEAK+ on 10/12- added oxybutynin- urology advised to keep foley 7-10 days before considering removal TOV-patient would like to know the plan from orthopedics before removing Foley catheter will wait until tomorrow.Currently afebrile no leukocytosis. Recent Labs  Lab 06/22/21 0209 06/23/21 0314 06/24/21 0509 06/25/21 0532 06/26/21 0002  WBC 8.3 6.7 10.1  9.0 8.8    Left foot infection-abnormal signal abnormalities of cuboid worrisome for osteomyelitis, cellulitis and myositis PVD: Seen by [name], cardio- underwent angiogram 10/11 no significant iliofemoral disease patent left SFA stents, 2/3 vessel below the knee runoff to the foot, proximal occluded left AT.Left foot no palpable DP pulse.  S/p left AT revascularization 10/14 a.m. by Dr. Patwardhan-cardiology arranging for outpatient duplex ultrasound and outpatient follow-up.  On antibiotics as above-discussed with [name] advised to keep the antibiotics for now he will evaluate the wound tomorrow for further recommendation. CRP  normal, sed rate 17.  CAD S/P pci Followed by [name].  Currently no chest pain.  Continue Lipitor Plavix.  IDDM with uncontrolled hyperglycemia, HbA1c 7.9.  Had hypoglycemia 10/16.   so discontinued Premeal insulin, decrease Semeglee to 7 units , cont ssi Recent Labs  Lab 06/25/21 2052 06/25/21 2147 06/26/21 0007 06/26/21 0422 06/26/21 0637  GLUCAP 222* 238* 207* 141* 230*     PAF: Rate controlled on Coreg. Eliquis on hold- on heparin drip until no further procedure by orthopedic surgery.    Chronic pain continue fentanyl patch pain management  Sinus bradycardia overnight in 40s to 50s 10/16-17 night- Coreg discontinued.. monitor  ESRD on HD TTS: Nephrology on board Next HD 10/18 Hypertension/volume: BP is stable.  Coreg discontinued due to bradycardia 10/17.  Continue amlodipine , CONT VOLUME MANAGEMETN W/ hd Metabolic bone disease: Calcium/phosphorus stable continue home binders Anemia of chronic renal disease: On Aranesp q. Tuesday.  Monitor H&H transfuse if less than 7 g. Recent Labs  Lab 06/22/21 0209 06/23/21 0314 06/24/21 0509 06/25/21 0532 06/26/21 0002  HGB 9.9* 9.6* 9.2* 9.6* 10.3*  HCT 30.6*  28.6* 27.7* 28.5* 31.7*     Class 1 Obesity:Patient's Body mass index is 33.46 kg/m. : Will benefit with PCP follow-up, weight loss  healthy  lifestyle and outpatient sleep evaluation.  Goals of care palliative care on board remains full code.  DVT prophylaxis: SCD's Start: 06/23/21 2013 SCD's Start: 06/20/21 1108 SCDs Start: 06/14/21 0047 Code Status:   Code Status: Full Code Family Communication: plan of care discussed with patient at bedside. Status is: Inpatient  Remains inpatient appropriate because:  for ongoing IV antibiotic use and further recommendation by [name]. Lives at home with wife and son Anticipated disposition to skilled nursing facility hopefully 1-2 days.  TOC has one bed offer and will discuss with family today   Objective: Vitals: Today's Vitals   06/26/21 0127 06/26/21 0418 06/26/21 0436 06/26/21 0536  BP:  (!) 131/45    Pulse:  (!) 54    Resp:  17    Temp:  97.7 F (36.5 C)    TempSrc:  Oral    SpO2:  100%    Weight:  118.2 kg    Height:      PainSc: Asleep  9  5    Physical Examination: General exam: AAOx 3, obese older than stated age, weak appearing. HEENT:Oral mucosa moist, Ear/Nose WNL grossly, dentition normal. Respiratory system: bilaterally clear breath sounds, no use of accessory muscle Cardiovascular system: S1 & S2 +, No JVD,. Gastrointestinal system: Abdomen soft,NT,ND, BS+ Nervous System:Alert, awake, moving extremities and grossly nonfocal Extremities: Left foot wound  is dry mildly tender no drainage, RLE prosthesis BKA. Skin:Surgical site, above penile area with staples intact clean dry, no rashes,no icterus. JQB:HALPFX muscle bulk,tone, power   Medications reviewed:  Scheduled Meds:  (feeding supplement) PROSource Plus  30 mL Oral BID BM   amLODipine  5 mg Oral Daily   atorvastatin  40 mg Oral QHS   Chlorhexidine Gluconate Cloth  6 each Topical Q0600   clopidogrel  75 mg Oral Daily   collagenase   Topical Daily   darbepoetin (ARANESP) injection - DIALYSIS  100 mcg Intravenous Q Tue-HD   famotidine  20 mg Oral Daily   fentaNYL  1 patch Transdermal Q72H    influenza vaccine adjuvanted  0.5 mL Intramuscular Tomorrow-1000   insulin aspart  0-5 Units Subcutaneous QHS   insulin aspart  0-9 Units Subcutaneous TID WC   insulin glargine-yfgn  7 Units Subcutaneous Daily   loratadine  10 mg Oral Daily   multivitamin  1 tablet Oral QHS   oxybutynin  5 mg Oral TID   pantoprazole  40 mg Oral Daily   pneumococcal 23 valent vaccine  0.5 mL Intramuscular Tomorrow-1000   pregabalin  75 mg Oral Daily   rOPINIRole  2 mg Oral QHS   sodium chloride flush  3 mL Intravenous Q12H   sodium chloride flush  3 mL Intravenous Q12H   tamsulosin  0.4 mg Oral Daily   Continuous Infusions:  sodium chloride     sodium chloride     ceFEPime (MAXIPIME) IV Stopped (06/24/21 1838)   dextrose 5% lactated ringers Stopped (06/24/21 1146)   heparin 2,100 Units/hr (06/26/21 0459)   insulin Stopped (06/24/21 1146)   vancomycin Stopped (06/25/21 2103)   Diet Order             Diet renal/carb modified with fluid restriction Diet-HS Snack? Nothing; Fluid restriction: 1200 mL Fluid; Room service appropriate? Yes; Fluid consistency: Thin  Diet effective now  Intake/Output  Intake/Output Summary (Last 24 hours) at 06/26/2021 0732 Last data filed at 06/26/2021 0459 Gross per 24 hour  Intake 1958.48 ml  Output 1250 ml  Net 708.48 ml    Intake/Output from previous day: 10/16 0701 - 10/17 0700 In: 1958.5 [P.O.:100; I.V.:1545.2; IV Piggyback:303.3] Out: 1250 [Urine:1250] Net IO Since Admission: -3,945.47 mL [06/26/21 0732]   Weight change: 0.1 kg  Wt Readings from Last 3 Encounters:  06/26/21 118.2 kg  06/12/21 108.4 kg  05/01/21 94.8 kg     Consultants:see note  Procedures:see note Antimicrobials: Anti-infectives (From admission, onward)    Start     Dose/Rate Route Frequency Ordered Stop   06/15/21 2200  fluconazole (DIFLUCAN) IVPB 200 mg  Status:  Discontinued        200 mg 100 mL/hr over 60 Minutes Intravenous Daily at bedtime 06/14/21  0017 06/17/21 0716   06/15/21 1800  ceFEPIme (MAXIPIME) 2 g in sodium chloride 0.9 % 100 mL IVPB        2 g 200 mL/hr over 30 Minutes Intravenous Every T-Th-Sa (1800) 06/14/21 0922     06/15/21 1200  ceFEPIme (MAXIPIME) 2 g in sodium chloride 0.9 % 100 mL IVPB  Status:  Discontinued        2 g 200 mL/hr over 30 Minutes Intravenous Every T-Th-Sa (Hemodialysis) 06/14/21 0025 06/14/21 0922   06/15/21 1200  vancomycin (VANCOCIN) IVPB 1000 mg/200 mL premix        1,000 mg 200 mL/hr over 60 Minutes Intravenous Every T-Th-Sa (Hemodialysis) 06/14/21 0025     06/14/21 1000  metroNIDAZOLE (FLAGYL) IVPB 500 mg        500 mg 100 mL/hr over 60 Minutes Intravenous Every 12 hours 06/14/21 0025 06/21/21 0959   06/14/21 0030  fluconazole (DIFLUCAN) IVPB 400 mg        400 mg 100 mL/hr over 120 Minutes Intravenous STAT 06/14/21 0017 06/14/21 0400   06/13/21 2048  vancomycin variable dose per unstable renal function (pharmacist dosing)  Status:  Discontinued         Does not apply See admin instructions 06/13/21 2048 06/14/21 0030   06/13/21 2030  ceFEPIme (MAXIPIME) 2 g in sodium chloride 0.9 % 100 mL IVPB        2 g 200 mL/hr over 30 Minutes Intravenous  Once 06/13/21 2017 06/13/21 2145   06/13/21 2030  metroNIDAZOLE (FLAGYL) IVPB 500 mg        500 mg 100 mL/hr over 60 Minutes Intravenous  Once 06/13/21 2017 06/13/21 2157   06/13/21 2030  vancomycin (VANCOREADY) IVPB 2000 mg/400 mL        2,000 mg 200 mL/hr over 120 Minutes Intravenous  Once 06/13/21 2017 06/14/21 0107      Culture/Microbiology    Component Value Date/Time   SDES URINE, CATHETERIZED 06/21/2021 1416   SPECREQUEST NONE 06/21/2021 1416   CULT  06/21/2021 1416    NO GROWTH Performed at [facility], [address], [place], [place] [address]    REPTSTATUS 06/22/2021 FINAL 06/21/2021 1416    Other culture-see note  Unresulted Labs (From admission, onward)     Start     Ordered   06/23/21 1214  Heparin level  (unfractionated)  Daily,   R     Question:  Specimen collection method  Answer:  Lab=Lab collect   06/23/21 1213           Data Reviewed: I have personally reviewed following labs and imaging studies CBC: Recent Labs  Lab 06/22/21 0209 06/23/21 0314 06/24/21 0509 06/25/21 0532 06/26/21 0002  WBC 8.3 6.7 10.1 9.0 8.8  HGB 9.9* 9.6* 9.2* 9.6* 10.3*  HCT 30.6* 28.6* 27.7* 28.5* 31.7*  MCV 92.7 92.0 91.4 93.1 93.5  PLT 237 200 212 184 034    Basic Metabolic Panel: Recent Labs  Lab 06/21/21 0150 06/22/21 0209 06/24/21 0106 06/24/21 0509 06/24/21 0743 06/25/21 0532 06/26/21 0002  NA 131*   < > 129* 129* 133* 132* 132*  K 3.9   < > 4.9 4.4 4.4 3.9 4.8  CL 97*   < > 94* 95* 97* 96* 97*  CO2 25   < > 23 21* 25 25 25   GLUCOSE 216*   < > 412* 216* 146* 278* 232*  BUN 16   < > 34* 36* 36* 22 27*  CREATININE 3.29*   < > 4.70* 4.67* 4.76* 3.60* 4.08*  CALCIUM 8.0*   < > 8.2* 8.2* 8.3* 7.6* 7.8*  PHOS 3.7  --   --   --   --   --   --    < > = values in this interval not displayed.    GFR: Estimated Creatinine Clearance: 24 mL/min (A) (by C-G formula based on SCr of 4.08 mg/dL (H)). Liver Function Tests: Recent Labs  Lab 06/21/21 0150  ALBUMIN 2.5*    No results for input(s): LIPASE, AMYLASE in the last 168 hours. No results for input(s): AMMONIA in the last 168 hours. Coagulation Profile: No results for input(s): INR, PROTIME in the last 168 hours. Cardiac Enzymes: No results for input(s): CKTOTAL, CKMB, CKMBINDEX, TROPONINI in the last 168 hours. BNP (last 3 results) No results for input(s): PROBNP in the last 8760 hours. HbA1C: No results for input(s): HGBA1C in the last 72 hours. CBG: Recent Labs  Lab 06/25/21 2052 06/25/21 2147 06/26/21 0007 06/26/21 0422 06/26/21 0637  GLUCAP 222* 238* 207* 141* 230*    Lipid Profile: No results for input(s): CHOL, HDL, LDLCALC, TRIG, CHOLHDL, LDLDIRECT in the last 72 hours. Thyroid Function Tests: No results for  input(s): TSH, T4TOTAL, FREET4, T3FREE, THYROIDAB in the last 72 hours. Anemia Panel: No results for input(s): VITAMINB12, FOLATE, FERRITIN, TIBC, IRON, RETICCTPCT in the last 72 hours. Sepsis Labs: No results for input(s): PROCALCITON, LATICACIDVEN in the last 168 hours.  Recent Results (from the past 240 hour(s))  Urine Culture     Status: None   Collection Time: 06/21/21  2:16 PM   Specimen: Urine, Catheterized  Result Value Ref Range Status   Specimen Description URINE, CATHETERIZED  Final   Special Requests NONE  Final   Culture   Final    NO GROWTH Performed at [facility], [address] [place], [place] [address]    Report Status 06/22/2021 FINAL  Final      Radiology Studies: No results found.   LOS: 12 days   Antonieta Pert, MD [facility]  06/26/2021, 7:32 AM

## 2021-06-27 ENCOUNTER — Encounter (HOSPITAL_COMMUNITY): Payer: Self-pay | Admitting: Cardiology

## 2021-06-27 DIAGNOSIS — R6520 Severe sepsis without septic shock: Secondary | ICD-10-CM | POA: Diagnosis not present

## 2021-06-27 DIAGNOSIS — E872 Acidosis, unspecified: Secondary | ICD-10-CM | POA: Diagnosis not present

## 2021-06-27 DIAGNOSIS — A419 Sepsis, unspecified organism: Secondary | ICD-10-CM | POA: Diagnosis not present

## 2021-06-27 LAB — GLUCOSE, CAPILLARY
Glucose-Capillary: 172 mg/dL — ABNORMAL HIGH (ref 70–99)
Glucose-Capillary: 224 mg/dL — ABNORMAL HIGH (ref 70–99)
Glucose-Capillary: 215 mg/dL — ABNORMAL HIGH (ref 70–99)

## 2021-06-27 LAB — CBC
HCT: 30.4 % — ABNORMAL LOW (ref 39.0–52.0)
Hemoglobin: 9.5 g/dL — ABNORMAL LOW (ref 13.0–17.0)
MCH: 30.4 pg (ref 26.0–34.0)
MCHC: 31.3 g/dL (ref 30.0–36.0)
MCV: 97.1 fL (ref 80.0–100.0)
Platelets: 164 10*3/uL (ref 150–400)
RBC: 3.13 MIL/uL — ABNORMAL LOW (ref 4.22–5.81)
RDW: 15.1 % (ref 11.5–15.5)
WBC: 9.4 10*3/uL (ref 4.0–10.5)
nRBC: 0 % (ref 0.0–0.2)

## 2021-06-27 LAB — BASIC METABOLIC PANEL
Anion gap: 10 (ref 5–15)
BUN: 41 mg/dL — ABNORMAL HIGH (ref 8–23)
CO2: 23 mmol/L (ref 22–32)
Calcium: 7.5 mg/dL — ABNORMAL LOW (ref 8.9–10.3)
Chloride: 97 mmol/L — ABNORMAL LOW (ref 98–111)
Creatinine, Ser: 5.24 mg/dL — ABNORMAL HIGH (ref 0.61–1.24)
GFR, Estimated: 11 mL/min — ABNORMAL LOW (ref >60)
Glucose, Bld: 184 mg/dL — ABNORMAL HIGH (ref 70–99)
Potassium: 4.5 mmol/L (ref 3.5–5.1)
Sodium: 130 mmol/L — ABNORMAL LOW (ref 135–145)

## 2021-06-27 LAB — HEPARIN LEVEL (UNFRACTIONATED): Heparin Unfractionated: 0.5 IU/mL (ref 0.30–0.70)

## 2021-06-27 NOTE — Progress Notes (Signed)
[facility] Progress Note   Subjective:  Seen on HD, no c/o's. today  Objective Vitals:   06/27/21 0900 06/27/21 0930 06/27/21 1000 06/27/21 1030  BP: (!) 136/52 (!) 127/50 (!) 124/54 (!) 117/42  Pulse: (!) 54 (!) 55 (!) 57 68  Resp: 17 17 16    Temp:      TempSrc:      SpO2:      Weight:      Height:       Physical Exam General: Well appearing man, NAD Heart: RRR; no murmur Lungs: CTA anteriorly Abdomen: soft GU: Foley in place, suprapubic staples in place Extremities: R BKA,  bilat UE edema 1-2+  Dialysis Access: LUE AVF + thrill  Dialysis Orders: [facility]   4h 47min  450/800  108kg  2/2 bath  AVF  Hep none  - Mircera 16mcg IV q 2 wks (last 9/27) - Hectoral 64mcg IV q HD   Assessment/Plan:  L foot infection + penile prosthesis infection/ UTI: prosthesis removed by urology on 10/5. 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  Urine cx growing enterococcus faecalis. Remains on Vanc/Cefepime.   ESRD: Continue usual HD TTS schedule - next 10/18.  HTN/volume: BP wnl, weights up - will plan on more aggressive UF this wk  Anemia: Hgb 9.6 - continue Aranesp q Tues   Metabolic bone disease: CorrCa/Phos ok, continue home binders  Nutrition:  Alb low, continue protein supplements.  T2DM: Hyperglycemia overnight and on insulin drip - glucose improving.  PAD: Arteriogram 10/11, s/p PTA to anterior tibial artery 10/4 with cardiology.   Kelly Splinter, MD 06/27/2021, 11:50 AM      Additional Objective Labs: Basic Metabolic Panel: Recent Labs  Lab 06/21/21 0150 06/22/21 0209 06/25/21 0532 06/26/21 0002 06/27/21 0131  NA 131*   < > 132* 132* 130*  K 3.9   < > 3.9 4.8 4.5  CL 97*   < > 96* 97* 97*  CO2 25   < > 25 25 23   GLUCOSE 216*   < > 278* 232* 184*  BUN 16   < > 22 27* 41*  CREATININE 3.29*   < > 3.60* 4.08* 5.24*  CALCIUM 8.0*   < > 7.6* 7.8* 7.5*  PHOS 3.7  --   --   --   --    < > = values in this interval not displayed.    Liver Function  Tests: Recent Labs  Lab 06/21/21 0150  ALBUMIN 2.5*    CBC: Recent Labs  Lab 06/23/21 0314 06/24/21 0509 06/25/21 0532 06/26/21 0002 06/27/21 0131  WBC 6.7 10.1 9.0 8.8 9.4  HGB 9.6* 9.2* 9.6* 10.3* 9.5*  HCT 28.6* 27.7* 28.5* 31.7* 30.4*  MCV 92.0 91.4 93.1 93.5 97.1  PLT 200 212 184 184 164    Medications:  sodium chloride     sodium chloride     ceFEPime (MAXIPIME) IV Stopped (06/24/21 1838)   dextrose 5% lactated ringers Stopped (06/24/21 1146)   heparin 2,100 Units/hr (06/27/21 0138)   insulin Stopped (06/24/21 1146)   vancomycin 1,000 mg (06/27/21 1115)    (feeding supplement) PROSource Plus  30 mL Oral BID BM   amLODipine  5 mg Oral Daily   atorvastatin  40 mg Oral QHS   Chlorhexidine Gluconate Cloth  6 each Topical Q0600   clopidogrel  75 mg Oral Daily   collagenase   Topical Daily   darbepoetin (ARANESP) injection - DIALYSIS  100 mcg Intravenous Q Tue-HD  famotidine  20 mg Oral Daily   fentaNYL  1 patch Transdermal Q72H   insulin aspart  0-5 Units Subcutaneous QHS   insulin aspart  0-9 Units Subcutaneous TID WC   insulin glargine-yfgn  7 Units Subcutaneous Daily   loratadine  10 mg Oral Daily   multivitamin  1 tablet Oral QHS   oxybutynin  5 mg Oral TID   pantoprazole  40 mg Oral Daily   pneumococcal 23 valent vaccine  0.5 mL Intramuscular Tomorrow-1000   pregabalin  75 mg Oral Daily   rOPINIRole  2 mg Oral QHS   sodium chloride flush  3 mL Intravenous Q12H   sodium chloride flush  3 mL Intravenous Q12H   tamsulosin  0.4 mg Oral Daily

## 2021-06-27 NOTE — Progress Notes (Signed)
Urology Inpatient Progress Report    Intv/Subj: Called by RN last night due to concern of blood in urine.  Today pt reports no further blood.  Foley in place.  Awaiting ortho plan prior to removal.  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[place])   ESRD on hemodialysis ([place])   Paroxysmal atrial fibrillation with RVR ([place])  Current Facility-Administered Medications  Medication Dose Route Frequency Provider Last Rate Last Admin   (feeding supplement) PROSource Plus liquid 30 mL  30 mL Oral BID BM Stovall, Kathryn R, PA-C   30 mL at 06/27/21 1305   0.9 %  sodium chloride infusion  250 mL Intravenous PRN Patwardhan, Manish J, MD       0.9 %  sodium chloride infusion  250 mL Intravenous PRN Irene Pap N, DO       acetaminophen (TYLENOL) tablet 650 mg  650 mg Oral Q4H PRN Patwardhan, Manish J, MD       amLODipine (NORVASC) tablet 5 mg  5 mg Oral Daily Regalado, Belkys A, MD   5 mg at 06/27/21 1303   atorvastatin (LIPITOR) tablet 40 mg  40 mg Oral QHS Zada Finders R, MD   40 mg at 06/26/21 2124   ceFEPIme (MAXIPIME) 2 g in sodium chloride 0.9 % 100 mL IVPB  2 g Intravenous Q T,Th,Sat-1800 von Caren Griffins, RPH   Stopped at 06/24/21 1838   Chlorhexidine Gluconate Cloth 2 % PADS 6 each  6 each Topical Q0600 Loren Racer, PA-C   6 each at 06/27/21 5400   clopidogrel (PLAVIX) tablet 75 mg  75 mg Oral Daily Patwardhan, Manish J, MD   75 mg at 06/27/21 1303   collagenase (SANTYL) ointment   Topical Daily Little Ishikawa, MD   Given at 06/27/21 1307    Darbepoetin Alfa (ARANESP) injection 100 mcg  100 mcg Intravenous Q Coralee Rud, MD   100 mcg at 06/27/21 1116   famotidine (PEPCID) tablet 20 mg  20 mg Oral Daily Kc, Ramesh, MD   20 mg at 06/27/21 1304   fentaNYL (DURAGESIC) 50 MCG/HR 1 patch  1 patch Transdermal Q72H Chotiner, Yevonne Aline, MD   1 patch at 06/25/21 0959   heparin ADULT infusion 100 units/mL (25000 units/221mL)  2,100 Units/hr Intravenous Continuous Irene Pap N, DO 21 mL/hr at 06/27/21 0138 2,100 Units/hr at 06/27/21 0138   hydrALAZINE (APRESOLINE) injection 10 mg  10 mg Intravenous Q6H PRN Regalado, Belkys A, MD   10 mg at 06/22/21 0315   hydrALAZINE (APRESOLINE) injection 5 mg  5 mg Intravenous Q20 Min PRN Patwardhan, Manish J, MD       HYDROcodone-acetaminophen (NORCO/VICODIN) 5-325 MG per tablet 1-2 tablet  1-2 tablet Oral Q4H PRN Little Ishikawa, MD   2 tablet at 06/27/21 1304   HYDROmorphone (DILAUDID) injection 1 mg  1 mg Intravenous Q4H PRN Little Ishikawa, MD   1 mg at 06/25/21 0324   insulin aspart (novoLOG) injection 0-5 Units  0-5 Units Subcutaneous QHS Irene Pap N, DO   2 Units at  06/26/21 2122   insulin aspart (novoLOG) injection 0-9 Units  0-9 Units Subcutaneous TID WC Irene Pap N, DO   3 Units at 06/26/21 1751   insulin glargine-yfgn (SEMGLEE) injection 7 Units  7 Units Subcutaneous Daily Kc, Maren [place], MD   7 Units at 06/27/21 1304   linaclotide (LINZESS) capsule 145 mcg  145 mcg Oral Daily PRN Antonieta Pert, MD       loratadine (CLARITIN) tablet 10 mg  10 mg Oral Daily Kc, Ramesh, MD   10 mg at 06/27/21 1307   multivitamin (RENA-VIT) tablet 1 tablet  1 tablet Oral QHS Joselyn Glassman A, RPH   1 tablet at 06/26/21 2123   ondansetron (ZOFRAN) injection 4 mg  4 mg Intravenous Q6H PRN Patwardhan, Manish J, MD       ondansetron (ZOFRAN) tablet 4 mg  4 mg Oral Q6H PRN Lenore Cordia, MD       oxybutynin (DITROPAN) tablet 5 mg  5 mg Oral TID McDiarmid, Blane Ohara, MD   5 mg at 06/27/21 1304    pantoprazole (PROTONIX) EC tablet 40 mg  40 mg Oral Daily Kc, Maren [place], MD   40 mg at 06/27/21 1304   pneumococcal 23 valent vaccine (PNEUMOVAX-23) injection 0.5 mL  0.5 mL Intramuscular Tomorrow-1000 Kc, Ramesh, MD       polyvinyl alcohol (LIQUIFILM TEARS) 1.4 % ophthalmic solution 1 drop  1 drop Both Eyes BID PRN Levada Dy, Dwayne A, RPH       pregabalin (LYRICA) capsule 75 mg  75 mg Oral Daily Kc, Ramesh, MD   75 mg at 06/27/21 1303   rOPINIRole (REQUIP) tablet 2 mg  2 mg Oral QHS Kc, Ramesh, MD   2 mg at 06/26/21 2124   sodium chloride flush (NS) 0.9 % injection 3 mL  3 mL Intravenous Q12H Patwardhan, Manish J, MD   3 mL at 06/26/21 1000   sodium chloride flush (NS) 0.9 % injection 3 mL  3 mL Intravenous PRN Patwardhan, Manish J, MD       sodium chloride flush (NS) 0.9 % injection 3 mL  3 mL Intravenous Q12H Hall, Carole N, DO   3 mL at 06/26/21 1000   sodium chloride flush (NS) 0.9 % injection 3 mL  3 mL Intravenous PRN Irene Pap N, DO       tamsulosin (FLOMAX) capsule 0.4 mg  0.4 mg Oral Daily Kc, Ramesh, MD   0.4 mg at 06/27/21 1304   vancomycin (VANCOCIN) IVPB 1000 mg/200 mL premix  1,000 mg Intravenous Q T,Th,Sa-HD Franky Macho, RPH 200 mL/hr at 06/27/21 1115 1,000 mg at 06/27/21 1115     Objective: Vital: Vitals:   06/27/21 1100 06/27/21 1136 06/27/21 1232 06/27/21 1553  BP: (!) 122/48 (!) 129/51 (!) 144/59 (!) 163/45  Pulse: (!) 52 (!) 53 74 66  Resp: 16 18 20 17   Temp:   98.5 F (36.9 C) 99.1 F (37.3 C)  TempSrc:   Oral Oral  SpO2:   97% 97%  Weight:  114.4 kg    Height:       I/Os: I/O last 3 completed shifts: In: 4801.4 [P.O.:2510; I.V.:1978.2; Other:10; IV Piggyback:303.3] Out: 1900 [Urine:450; Other:1450]  Physical Exam:  General: Patient is in no apparent distress Lungs: Normal respiratory effort, chest expands symmetrically. GI: abdomen is soft  GU: infrapubic incision c/d/I with staples, no erythema, mild edema of distal penile shaft/glans, non tender Foley:  34LP silicone foley draining clear yellow urine, no blood in tubing or  bag  Ext: lower extremities symmetric  Lab Results: Recent Labs    06/25/21 0532 06/26/21 0002 06/27/21 0131  WBC 9.0 8.8 9.4  HGB 9.6* 10.3* 9.5*  HCT 28.5* 31.7* 30.4*   Recent Labs    06/25/21 0532 06/26/21 0002 06/27/21 0131  NA 132* 132* 130*  K 3.9 4.8 4.5  CL 96* 97* 97*  CO2 25 25 23   GLUCOSE 278* 232* 184*  BUN 22 27* 41*  CREATININE 3.60* 4.08* 5.24*  CALCIUM 7.6* 7.8* 7.5*   No results for input(s): LABPT, INR in the last 72 hours. No results for input(s): LABURIN in the last 72 hours. Results for orders placed or performed during the hospital encounter of 06/13/21  Blood Culture (routine x 2)     Status: None   Collection Time: 06/13/21  7:40 PM   Specimen: BLOOD  Result Value Ref Range Status   Specimen Description BLOOD RIGHT ANTECUBITAL  Final   Special Requests   Final    BOTTLES DRAWN AEROBIC AND ANAEROBIC Blood Culture results may not be optimal due to an inadequate volume of blood received in culture bottles   Culture   Final    NO GROWTH 5 DAYS Performed at [facility], [place] [address], [place], [place] [address]    Report Status 06/18/2021 FINAL  Final  Resp Panel by RT-PCR (Flu A&B, Covid) Nasopharyngeal Swab     Status: None   Collection Time: 06/13/21  8:17 PM   Specimen: Nasopharyngeal Swab; Nasopharyngeal(NP) swabs in vial transport medium  Result Value Ref Range Status   SARS Coronavirus 2 by RT PCR NEGATIVE NEGATIVE Final    Comment: (NOTE) SARS-CoV-2 target nucleic acids are NOT DETECTED.  The SARS-CoV-2 RNA is generally detectable in upper respiratory specimens during the acute phase of infection. The lowest concentration of SARS-CoV-2 viral copies this assay can detect is 138 copies/mL. A negative result does not preclude SARS-Cov-2 infection and should not be used as the sole basis for treatment or other patient management decisions. A negative result may  occur with  improper specimen collection/handling, submission of specimen other than nasopharyngeal swab, presence of viral mutation(s) within the areas targeted by this assay, and inadequate number of viral copies(<138 copies/mL). A negative result must be combined with clinical observations, patient history, and epidemiological information. The expected result is Negative.  Fact Sheet for Patients:  EntrepreneurPulse.com.au  Fact Sheet for Healthcare Providers:  IncredibleEmployment.be  This test is no t yet approved or cleared by the [place] FDA and  has been authorized for detection and/or diagnosis of SARS-CoV-2 by FDA under an Emergency Use Authorization (EUA). This EUA will remain  in effect (meaning this test can be used) for the duration of the COVID-19 declaration under Section 564(b)(1) of the Act, 21 U.S.C.section 360bbb-3(b)(1), unless the authorization is terminated  or revoked sooner.       Influenza A by PCR NEGATIVE NEGATIVE Final   Influenza B by PCR NEGATIVE NEGATIVE Final    Comment: (NOTE) The Xpert Xpress SARS-CoV-2/FLU/RSV plus assay is intended as an aid in the diagnosis of influenza from Nasopharyngeal swab specimens and should not be used as a sole basis for treatment. Nasal washings and aspirates are unacceptable for Xpert Xpress SARS-CoV-2/FLU/RSV testing.  Fact Sheet for Patients: EntrepreneurPulse.com.au  Fact Sheet for Healthcare Providers: IncredibleEmployment.be  This test is not yet approved or cleared by the [place] FDA and has been authorized for detection and/or diagnosis of SARS-CoV-2 by FDA under  an Emergency Use Authorization (EUA). This EUA will remain in effect (meaning this test can be used) for the duration of the COVID-19 declaration under Section 564(b)(1) of the Act, 21 U.S.C. section 360bbb-3(b)(1), unless the authorization is terminated  or revoked.  Performed at [facility], [place] [address] [place], [place] [address]   Blood Culture (routine x 2)     Status: None   Collection Time: 06/13/21  8:50 PM   Specimen: BLOOD RIGHT HAND  Result Value Ref Range Status   Specimen Description BLOOD RIGHT HAND  Final   Special Requests   Final    BOTTLES DRAWN AEROBIC AND ANAEROBIC Blood Culture adequate volume   Culture   Final    NO GROWTH 5 DAYS Performed at [facility], [place] [address], [place], [place] [address]    Report Status 06/18/2021 FINAL  Final  Urine Culture     Status: Abnormal   Collection Time: 06/14/21 12:20 PM   Specimen: In/Out Cath Urine  Result Value Ref Range Status   Specimen Description IN/OUT CATH URINE  Final   Special Requests   Final    NONE Performed at [facility], [place] [address], [place], [place] [address]    Culture >=100,000 COLONIES/mL ENTEROCOCCUS FAECALIS (A)  Final   Report Status 06/17/2021 FINAL  Final   Organism ID, Bacteria ENTEROCOCCUS FAECALIS (A)  Final      Susceptibility   Enterococcus faecalis - MIC*    AMPICILLIN <=2 SENSITIVE Sensitive     NITROFURANTOIN <=16 SENSITIVE Sensitive     VANCOMYCIN 1 SENSITIVE Sensitive     * >=100,000 COLONIES/mL ENTEROCOCCUS FAECALIS  Aerobic/Anaerobic Culture w Gram Stain (surgical/deep wound)     Status: None   Collection Time: 06/14/21  8:14 PM   Specimen: PATH GI Other; Tissue  Result Value Ref Range Status   Specimen Description WOUND PENIS  Final   Special Requests DISCHARGE  Final   Gram Stain   Final    NO ORGANISMS SEEN SQUAMOUS EPITHELIAL CELLS PRESENT ABUNDANT WBC PRESENT,BOTH PMN AND MONONUCLEAR MODERATE GRAM POSITIVE COCCI    Culture   Final    FEW ENTEROCOCCUS FAECALIS FEW GROUP B STREP(S.AGALACTIAE)ISOLATED TESTING AGAINST S. AGALACTIAE NOT ROUTINELY PERFORMED DUE TO PREDICTABILITY OF AMP/PEN/VAN SUSCEPTIBILITY. NO ANAEROBES ISOLATED Performed at [facility], [place] [address]  [place], [place] [address]    Report Status 06/20/2021 FINAL  Final   Organism ID, Bacteria ENTEROCOCCUS FAECALIS  Final      Susceptibility   Enterococcus faecalis - MIC*    AMPICILLIN <=2 SENSITIVE Sensitive     VANCOMYCIN 1 SENSITIVE Sensitive     GENTAMICIN SYNERGY SENSITIVE Sensitive     * FEW ENTEROCOCCUS FAECALIS  Fungus Culture With Stain     Status: None (Preliminary result)   Collection Time: 06/14/21  8:14 PM  Result Value Ref Range Status   Fungus Stain Final report  Final    Comment: (NOTE) Performed At: [facility] [address] [place], [place] [id number] Rush Farmer MD IR:[id number]    Fungus (Mycology) Culture PENDING  Incomplete   Fungal Source WOUND  Final    Comment: PENIS DISHARGE Performed at [facility], [place] [address] [place], [place] [address]   Fungus Culture Result     Status: None   Collection Time: 06/14/21  8:14 PM  Result Value Ref Range Status   Result 1 Comment  Final    Comment: (NOTE) KOH/Calcofluor preparation:  no fungus observed. Performed At: [facility] [place], [place] [id number] Rush Farmer MD [id number]   Surgical PCR screen     Status: Abnormal   Collection Time: 06/14/21 11:20 PM   Specimen: Nasal Mucosa; Nasal Swab  Result Value Ref Range Status   MRSA, PCR POSITIVE (A) NEGATIVE Final    Comment: RESULT CALLED TO, READ BACK BY AND VERIFIED WITH: RN CAREY D. 06/15/21@1 :09 BY TW    Staphylococcus aureus POSITIVE (A) NEGATIVE Final    Comment: (NOTE) The Xpert SA Assay (FDA approved for NASAL specimens in patients 29 years of age and older), is one component of a comprehensive surveillance program. It is not intended to diagnose infection nor to guide or monitor treatment. Performed at [facility], [place] [address], [place], [place] [address]   Urine Culture     Status: None   Collection Time: 06/21/21  2:16 PM   Specimen: Urine, Catheterized  Result Value Ref Range  Status   Specimen Description URINE, CATHETERIZED  Final   Special Requests NONE  Final   Culture   Final    NO GROWTH Performed at [facility], [address] [place], [place] [address]    Report Status 06/22/2021 FINAL  Final    Studies/Results: No results found.  Assessment/Plan: Infected IPP s/p removal: incision healing well, no PE findings of infection  Hematuria: likely traumatic given no further episodes and complete resolution Foley catheter: awaiting ortho plan prior to dcing  Please page urology if new questions/concerns  Jacalyn Lefevre, MD Urology 06/27/2021, 4:01 PM

## 2021-06-27 NOTE — Progress Notes (Signed)
Pt arrived back to 4E from HD. VVS. Pt alert and call light is in reach.  Raelyn Number, RN

## 2021-06-27 NOTE — Progress Notes (Addendum)
Inpatient Diabetes Program Recommendations  AACE/ADA: New Consensus Statement on Inpatient Glycemic Control (2015)  Target Ranges:  Prepandial:   less than 140 mg/dL      Peak postprandial:   less than 180 mg/dL (1-2 hours)      Critically ill patients:  140 - 180 mg/dL   Lab Results  Component Value Date   GLUCAP 172 (H) 06/27/2021   HGBA1C 7.9 (H) 06/14/2021    Review of Glycemic Control Results for GARRITT, MOLYNEUX (MRN [id number]) as of 06/27/2021 14:30  Ref. Range 06/26/2021 12:04 06/26/2021 16:43 06/26/2021 19:40 06/27/2021 06:23  Glucose-Capillary Latest Ref Range: 70 - 99 mg/dL 178 (H) 264 (H) 240 (H) 172 (H)   Home DM Meds: Humalog 13 units TID                             Lantus 55 units QHS Diabetes history: DM 2 Current orders for Inpatient glycemic control:  Novolog 0-9 units TID & HS, Semglee 7 units QD   Inpatient Diabetes Program Recommendations:     Consider increasing Semglee to 12 units QD and adding Novolog 2 units TID (assuming patient consuming >50% of meals)  Thanks, Bronson Curb, MSN, RNC-OB Diabetes Coordinator [phone] (8a-5p)

## 2021-06-27 NOTE — Progress Notes (Signed)
ANTICOAGULATION CONSULT NOTE  Pharmacy Consult for Heparin Indication: atrial fibrillation  Allergies  Allergen Reactions   Contrast Media [Iodinated Diagnostic Agents]     Difficulty breathing     Ivp Dye [Iodinated Diagnostic Agents] Anaphylaxis and Other (See Comments)    Breathing problems, altered mental state    Adhesive [Tape] Rash    Rash after 3-4 days use    Patient Measurements: Height: 6\' 2"  (188 cm) Weight: 118.4 kg (261 lb 0.4 oz) IBW/kg (Calculated) : 82.2 Heparin Dosing Weight: 105 kg  Vital Signs: Temp: 98.5 F (36.9 C) (10/18 1232) Temp Source: Oral (10/18 1232) BP: 144/59 (10/18 1232) Pulse Rate: 74 (10/18 1232)  Labs: Recent Labs    06/25/21 0532 06/26/21 0002 06/27/21 0131  HGB 9.6* 10.3* 9.5*  HCT 28.5* 31.7* 30.4*  PLT 184 184 164  HEPARINUNFRC 0.54 0.58 0.50  CREATININE 3.60* 4.08* 5.24*     Estimated Creatinine Clearance: 18.7 mL/min (A) (by C-G formula based on SCr of 5.24 mg/dL (H)).   Medications:  Scheduled:   (feeding supplement) PROSource Plus  30 mL Oral BID BM   amLODipine  5 mg Oral Daily   atorvastatin  40 mg Oral QHS   Chlorhexidine Gluconate Cloth  6 each Topical Q0600   clopidogrel  75 mg Oral Daily   collagenase   Topical Daily   darbepoetin (ARANESP) injection - DIALYSIS  100 mcg Intravenous Q Tue-HD   famotidine  20 mg Oral Daily   fentaNYL  1 patch Transdermal Q72H   insulin aspart  0-5 Units Subcutaneous QHS   insulin aspart  0-9 Units Subcutaneous TID WC   insulin glargine-yfgn  7 Units Subcutaneous Daily   loratadine  10 mg Oral Daily   multivitamin  1 tablet Oral QHS   oxybutynin  5 mg Oral TID   pantoprazole  40 mg Oral Daily   pneumococcal 23 valent vaccine  0.5 mL Intramuscular Tomorrow-1000   pregabalin  75 mg Oral Daily   rOPINIRole  2 mg Oral QHS   sodium chloride flush  3 mL Intravenous Q12H   sodium chloride flush  3 mL Intravenous Q12H   tamsulosin  0.4 mg Oral Daily   Infusions:   sodium  chloride     sodium chloride     ceFEPime (MAXIPIME) IV Stopped (06/24/21 1838)   heparin 2,100 Units/hr (06/27/21 0138)   vancomycin 1,000 mg (06/27/21 1115)    Assessment: 65 yo male on apixaban PTA for Afib (last dose taken 10/03). Now s/p revascularization of occluded left AT on 10/14 in the setting of  L foot osteomyelitis and plans for possible procedures. Pharmacy consulted for heparin dosing.   Heparin level of 0.5 remains therapeutic on heparin infusion of 2100 units/hr. CBC stable, no s/sx bleeding per chart review. Continue on current heparin rate.  Goal of Therapy:  Heparin level 0.3-0.7 units/ml Monitor platelets by anticoagulation protocol: Yes   Plan:  Continue heparin at 2100 units/hr Check heparin level daily while on heparin Continue to monitor H&H and platelets F/u transition back to apixaban when able   Thank you for allowing pharmacy to be a part of this patient's care.  Alanda Slim, PharmD, [facility] Clinical Pharmacist Please see AMION for all Pharmacists' Contact Phone Numbers 06/27/2021, 1:01 PM

## 2021-06-27 NOTE — Progress Notes (Signed)
PROGRESS NOTE    [name]  OZH:[id number] DOB: [birth year] DOA: 06/13/2021 PCP: [name], PA-C   Chief Complaint  Patient presents with   Wound Infection   Brief Narrative/Hospital Course:  [name], 65 y.o. 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, Dr. Clyda Greener seen on 10/3 by [name] potential need for transtibial amputation and referred to [name] for PVD/angiogram PAF on Eliquis, ESRD on HD TTS, chronic diastolic CHF, G2XB, HTN, dyslipidemia sent from dialysis center for further evaluation of the wound due to increasing swelling, pain around the penis and the scrotum and had purulent discharge from the urethra and purulent secretion from penile prosthesis. Seen in the ED-found to have sepsis with leukocytosis lactic acidosis.  Urology consulted underwent removal of 3 piece inflatable penile prosthesis 10/5-and patient admitted on IV antibiotics.   Subjective: Seen and examined this morning in dialysis he has no complaints.  Waiting to see [name].    Assessment & Plan:  Severe sepsis due to infected penile prosthesis: S/P removal of 3 piece inflatable penile prosthesis by Dr [name] 10/5.  JP drain has been removed.  Aerobic/anaerobic culture from 10/5 shows Enterococcus faecalis sensitive to ampicillin-urine culture 10/5 with same organism.  Blood culture no growth to date.  Repeat urine culture 10/12 no growth.  Off fluconazole, on cefepime/vancomycin.Intermittent Foley LEAK+ on 10/12- added oxybutynin- urology advised to keep foley 7-10 days before considering removal TOV-patient would like to know the plan from orthopedics before removing Foley catheter-awaiting for [name] input.  Patient came to dialysis early morning not seen by orthopedic yet.  Continue current plan for now.   Recent Labs  Lab 06/23/21 0314 06/24/21 0509 06/25/21 0532  06/26/21 0002 06/27/21 0131  WBC 6.7 10.1 9.0 8.8 9.4    Left foot infection-abnormal signal abnormalities of cuboid worrisome for osteomyelitis, cellulitis and myositis PVD: Seen by [name], cardio- underwent angiogram 10/11 no significant iliofemoral disease patent left SFA stents, 2/3 vessel below the knee runoff to the foot, proximal occluded left AT.Left foot no palpable DP pulse.  S/p left AT revascularization 10/14 a.m. by Dr. Patwardhan-cardiology arranging for outpatient duplex ultrasound and outpatient follow-up.  On antibiotics as above-discussed with [name] 10/17 and waiting for his input today.CRP  normal, sed rate 17.  CAD S/P pci Followed by [name].  Currently no chest pain.  Continue Lipitor Plavix.  IDDM with uncontrolled hyperglycemia, HbA1c 7.9.  Had hypoglycemia 10/16.   so discontinued Premeal insulin, decreased Semeglee to 7 units blood sugar overall stable/fluctuating.  Keep on SSI.  Monitor. Recent Labs  Lab 06/26/21 0637 06/26/21 1204 06/26/21 1643 06/26/21 1940 06/27/21 0623  GLUCAP 230* 178* 264* 240* 172*     PAF: Rate controlled, continue Coreg. Eliquis on hold- on heparin drip until no further procedure by orthopedic surgery.    Chronic pain continue fentanyl patch pain management  Sinus bradycardia overnight in 40s to 50s 10/16-17 night- Coreg discontinued.  Heart rate overall stable.  ESRD on HD TTS: Nephrology on board getting dialysis today.   Hypertension/volume: BP is stable.  Coreg discontinued due to bradycardia 10/17-blood pressure appears to be overall stable on amlodipine. Cont volume management in HD.   Metabolic bone disease: Calcium/phosphorus stable continue home binders Anemia of chronic renal disease: On Aranesp q. Tuesday.  H&H is stable.  Monitor.  Recent Labs  Lab 06/23/21 0314 06/24/21 0509 06/25/21 0532 06/26/21 0002 06/27/21 0131  HGB 9.6* 9.2* 9.6* 10.3* 9.5*  HCT 28.6* 27.7* 28.5* 31.7* 30.4*     Class 1  Obesity:Patient's Body mass index is 33.51 kg/m. : Will benefit with PCP follow-up, weight loss  healthy lifestyle and outpatient sleep evaluation.  Goals of care palliative care on board remains full code.  DVT prophylaxis: SCD's Start: 06/23/21 2013 SCD's Start: 06/20/21 1108 SCDs Start: 06/14/21 0047 Code Status:   Code Status: Full Code Family Communication: plan of care discussed with patient at bedside. Status is: Inpatient  Remains inpatient appropriate because:  for ongoing IV antibiotic use and further recommendation by [name]. Lives at home with wife and son Anticipated disposition to skilled nursing facility hopefully 1-2 days.  Patient has a bed offer and family looking at it. TOC following  Objective: Vitals: Today's Vitals   06/27/21 0900 06/27/21 0930 06/27/21 1000 06/27/21 1030  BP: (!) 136/52 (!) 127/50 (!) 124/54 (!) 117/42  Pulse: (!) 54 (!) 55 (!) 57 68  Resp: 17 17 16    Temp:      TempSrc:      SpO2:      Weight:      Height:      PainSc:       Physical Examination: General exam:AAOx 3,obese, older than stated age, weak appearing. HEENT:Oral mucosa moist, Ear/Nose WNL grossly, dentition normal. Respiratory system:Bilaterally clear breath sounds, no use of accessory muscle Cardiovascular system:S1 & S2 +, No JVD,. Gastrointestinal system:Abdomen soft,NT,ND, BS+ Nervous System:Alert, awake,moving extremities and grossly nonfocal Extremities: no edema, distal peripheral pulses palpable.  Skin: No rashes,no icterus.  Surgical site with staples intact clean dry above penis area MSK: Normal muscle bulk,tone, power  Foley in place RLE prosthesis present, dry wound on the left foot  Medications reviewed:  Scheduled Meds:  (feeding supplement) PROSource Plus  30 mL Oral BID BM   amLODipine  5 mg Oral Daily   atorvastatin  40 mg Oral QHS   Chlorhexidine Gluconate Cloth  6 each Topical Q0600   clopidogrel  75 mg Oral Daily   collagenase   Topical Daily    darbepoetin (ARANESP) injection - DIALYSIS  100 mcg Intravenous Q Tue-HD   famotidine  20 mg Oral Daily   fentaNYL  1 patch Transdermal Q72H   insulin aspart  0-5 Units Subcutaneous QHS   insulin aspart  0-9 Units Subcutaneous TID WC   insulin glargine-yfgn  7 Units Subcutaneous Daily   loratadine  10 mg Oral Daily   multivitamin  1 tablet Oral QHS   oxybutynin  5 mg Oral TID   pantoprazole  40 mg Oral Daily   pneumococcal 23 valent vaccine  0.5 mL Intramuscular Tomorrow-1000   pregabalin  75 mg Oral Daily   rOPINIRole  2 mg Oral QHS   sodium chloride flush  3 mL Intravenous Q12H   sodium chloride flush  3 mL Intravenous Q12H   tamsulosin  0.4 mg Oral Daily   Continuous Infusions:  sodium chloride     sodium chloride     ceFEPime (MAXIPIME) IV Stopped (06/24/21 1838)   dextrose 5% lactated ringers Stopped (06/24/21 1146)   heparin 2,100 Units/hr (06/27/21 0138)   insulin Stopped (06/24/21 1146)   vancomycin 1,000 mg (06/27/21 1115)   Diet Order             Diet renal/carb modified with fluid restriction Diet-HS Snack? Nothing; Fluid restriction: 1200 mL Fluid; Room service  appropriate? Yes; Fluid consistency: Thin  Diet effective now                   Intake/Output  Intake/Output Summary (Last 24 hours) at 06/27/2021 1146 Last data filed at 06/27/2021 0432 Gross per 24 hour  Intake 2122.94 ml  Output 1450 ml  Net 672.94 ml    Intake/Output from previous day: 10/17 0701 - 10/18 0700 In: 2842.9 [P.O.:2410; I.V.:432.9] Out: 1450  Net IO Since Admission: -2,552.53 mL [06/27/21 1146]   Weight change: 1.4 kg  Wt Readings from Last 3 Encounters:  06/27/21 118.4 kg  06/12/21 108.4 kg  05/01/21 94.8 kg     Consultants:see note  Procedures:see note Antimicrobials: Anti-infectives (From admission, onward)    Start     Dose/Rate Route Frequency Ordered Stop   06/15/21 2200  fluconazole (DIFLUCAN) IVPB 200 mg  Status:  Discontinued        200 mg 100 mL/hr over  60 Minutes Intravenous Daily at bedtime 06/14/21 0017 06/17/21 0716   06/15/21 1800  ceFEPIme (MAXIPIME) 2 g in sodium chloride 0.9 % 100 mL IVPB        2 g 200 mL/hr over 30 Minutes Intravenous Every T-Th-Sa (1800) 06/14/21 0922     06/15/21 1200  ceFEPIme (MAXIPIME) 2 g in sodium chloride 0.9 % 100 mL IVPB  Status:  Discontinued        2 g 200 mL/hr over 30 Minutes Intravenous Every T-Th-Sa (Hemodialysis) 06/14/21 0025 06/14/21 0922   06/15/21 1200  vancomycin (VANCOCIN) IVPB 1000 mg/200 mL premix        1,000 mg 200 mL/hr over 60 Minutes Intravenous Every T-Th-Sa (Hemodialysis) 06/14/21 0025     06/14/21 1000  metroNIDAZOLE (FLAGYL) IVPB 500 mg        500 mg 100 mL/hr over 60 Minutes Intravenous Every 12 hours 06/14/21 0025 06/21/21 0959   06/14/21 0030  fluconazole (DIFLUCAN) IVPB 400 mg        400 mg 100 mL/hr over 120 Minutes Intravenous STAT 06/14/21 0017 06/14/21 0400   06/13/21 2048  vancomycin variable dose per unstable renal function (pharmacist dosing)  Status:  Discontinued         Does not apply See admin instructions 06/13/21 2048 06/14/21 0030   06/13/21 2030  ceFEPIme (MAXIPIME) 2 g in sodium chloride 0.9 % 100 mL IVPB        2 g 200 mL/hr over 30 Minutes Intravenous  Once 06/13/21 2017 06/13/21 2145   06/13/21 2030  metroNIDAZOLE (FLAGYL) IVPB 500 mg        500 mg 100 mL/hr over 60 Minutes Intravenous  Once 06/13/21 2017 06/13/21 2157   06/13/21 2030  vancomycin (VANCOREADY) IVPB 2000 mg/400 mL        2,000 mg 200 mL/hr over 120 Minutes Intravenous  Once 06/13/21 2017 06/14/21 0107      Culture/Microbiology    Component Value Date/Time   SDES URINE, CATHETERIZED 06/21/2021 1416   SPECREQUEST NONE 06/21/2021 1416   CULT  06/21/2021 1416    NO GROWTH Performed at [facility], [place] [address] [place], [place] [address]    REPTSTATUS 06/22/2021 FINAL 06/21/2021 1416    Other culture-see note  Unresulted Labs (From admission, onward)     Start      Ordered   06/27/21 5397  Basic metabolic panel  Daily,   R     Question:  Specimen collection method  Answer:  Lab=Lab collect   06/26/21  1031   06/27/21 0500  CBC  Daily,   R     Question:  Specimen collection method  Answer:  Lab=Lab collect   06/26/21 1205   06/23/21 1214  Heparin level (unfractionated)  Daily,   R     Question:  Specimen collection method  Answer:  Lab=Lab collect   06/23/21 1213           Data Reviewed: I have personally reviewed following labs and imaging studies CBC: Recent Labs  Lab 06/23/21 0314 06/24/21 0509 06/25/21 0532 06/26/21 0002 06/27/21 0131  WBC 6.7 10.1 9.0 8.8 9.4  HGB 9.6* 9.2* 9.6* 10.3* 9.5*  HCT 28.6* 27.7* 28.5* 31.7* 30.4*  MCV 92.0 91.4 93.1 93.5 97.1  PLT 200 212 184 184 932    Basic Metabolic Panel: Recent Labs  Lab 06/21/21 0150 06/22/21 0209 06/24/21 0509 06/24/21 0743 06/25/21 0532 06/26/21 0002 06/27/21 0131  NA 131*   < > 129* 133* 132* 132* 130*  K 3.9   < > 4.4 4.4 3.9 4.8 4.5  CL 97*   < > 95* 97* 96* 97* 97*  CO2 25   < > 21* 25 25 25 23   GLUCOSE 216*   < > 216* 146* 278* 232* 184*  BUN 16   < > 36* 36* 22 27* 41*  CREATININE 3.29*   < > 4.67* 4.76* 3.60* 4.08* 5.24*  CALCIUM 8.0*   < > 8.2* 8.3* 7.6* 7.8* 7.5*  PHOS 3.7  --   --   --   --   --   --    < > = values in this interval not displayed.    GFR: Estimated Creatinine Clearance: 18.7 mL/min (A) (by C-G formula based on SCr of 5.24 mg/dL (H)). Liver Function Tests: Recent Labs  Lab 06/21/21 0150  ALBUMIN 2.5*    No results for input(s): LIPASE, AMYLASE in the last 168 hours. No results for input(s): AMMONIA in the last 168 hours. Coagulation Profile: No results for input(s): INR, PROTIME in the last 168 hours. Cardiac Enzymes: No results for input(s): CKTOTAL, CKMB, CKMBINDEX, TROPONINI in the last 168 hours. BNP (last 3 results) No results for input(s): PROBNP in the last 8760 hours. HbA1C: No results for input(s): HGBA1C in the last  72 hours. CBG: Recent Labs  Lab 06/26/21 0637 06/26/21 1204 06/26/21 1643 06/26/21 1940 06/27/21 0623  GLUCAP 230* 178* 264* 240* 172*    Lipid Profile: No results for input(s): CHOL, HDL, LDLCALC, TRIG, CHOLHDL, LDLDIRECT in the last 72 hours. Thyroid Function Tests: No results for input(s): TSH, T4TOTAL, FREET4, T3FREE, THYROIDAB in the last 72 hours. Anemia Panel: No results for input(s): VITAMINB12, FOLATE, FERRITIN, TIBC, IRON, RETICCTPCT in the last 72 hours. Sepsis Labs: No results for input(s): PROCALCITON, LATICACIDVEN in the last 168 hours.  Recent Results (from the past 240 hour(s))  Urine Culture     Status: None   Collection Time: 06/21/21  2:16 PM   Specimen: Urine, Catheterized  Result Value Ref Range Status   Specimen Description URINE, CATHETERIZED  Final   Special Requests NONE  Final   Culture   Final    NO GROWTH Performed at [facility], [address], [place], [place] [address]    Report Status 06/22/2021 FINAL  Final      Radiology Studies: No results found.   LOS: 13 days   Antonieta Pert, MD [facility]  06/27/2021, 11:46 AM

## 2021-06-28 DIAGNOSIS — A419 Sepsis, unspecified organism: Secondary | ICD-10-CM | POA: Diagnosis not present

## 2021-06-28 DIAGNOSIS — R6520 Severe sepsis without septic shock: Secondary | ICD-10-CM | POA: Diagnosis not present

## 2021-06-28 DIAGNOSIS — E872 Acidosis, unspecified: Secondary | ICD-10-CM | POA: Diagnosis not present

## 2021-06-28 LAB — GLUCOSE, CAPILLARY
Glucose-Capillary: 175 mg/dL — ABNORMAL HIGH (ref 70–99)
Glucose-Capillary: 189 mg/dL — ABNORMAL HIGH (ref 70–99)
Glucose-Capillary: 214 mg/dL — ABNORMAL HIGH (ref 70–99)
Glucose-Capillary: 278 mg/dL — ABNORMAL HIGH (ref 70–99)

## 2021-06-28 LAB — CBC
HCT: 30.6 % — ABNORMAL LOW (ref 39.0–52.0)
Hemoglobin: 10 g/dL — ABNORMAL LOW (ref 13.0–17.0)
MCH: 30.9 pg (ref 26.0–34.0)
MCHC: 32.7 g/dL (ref 30.0–36.0)
MCV: 94.4 fL (ref 80.0–100.0)
Platelets: 144 10*3/uL — ABNORMAL LOW (ref 150–400)
RBC: 3.24 MIL/uL — ABNORMAL LOW (ref 4.22–5.81)
RDW: 15.2 % (ref 11.5–15.5)
WBC: 6.9 10*3/uL (ref 4.0–10.5)
nRBC: 0 % (ref 0.0–0.2)

## 2021-06-28 LAB — BASIC METABOLIC PANEL
Anion gap: 10 (ref 5–15)
BUN: 27 mg/dL — ABNORMAL HIGH (ref 8–23)
CO2: 27 mmol/L (ref 22–32)
Calcium: 7.8 mg/dL — ABNORMAL LOW (ref 8.9–10.3)
Chloride: 97 mmol/L — ABNORMAL LOW (ref 98–111)
Creatinine, Ser: 4.04 mg/dL — ABNORMAL HIGH (ref 0.61–1.24)
GFR, Estimated: 15 mL/min — ABNORMAL LOW (ref >60)
Glucose, Bld: 198 mg/dL — ABNORMAL HIGH (ref 70–99)
Potassium: 4 mmol/L (ref 3.5–5.1)
Sodium: 134 mmol/L — ABNORMAL LOW (ref 135–145)

## 2021-06-28 LAB — HEPARIN LEVEL (UNFRACTIONATED): Heparin Unfractionated: 0.31 IU/mL (ref 0.30–0.70)

## 2021-06-28 MED ORDER — APIXABAN 5 MG PO TABS: 5.0000 mg | ORAL_TABLET | ORAL | Status: DC

## 2021-06-28 MED ORDER — APIXABAN 5 MG PO TABS
5.0000 mg | ORAL_TABLET | Freq: Two times a day (BID) | ORAL | Status: DC
  Administered 2021-06-28 – 2021-06-30 (×5): 5 mg via ORAL
  Filled 2021-06-28 (×5): qty 1

## 2021-06-28 MED ORDER — DOXYCYCLINE HYCLATE 100 MG PO TABS
100.0000 mg | ORAL_TABLET | Freq: Two times a day (BID) | ORAL | Status: DC
  Administered 2021-06-28 – 2021-06-30 (×5): 100 mg via ORAL
  Filled 2021-06-28 (×5): qty 1

## 2021-06-28 NOTE — Discharge Instructions (Signed)

## 2021-06-28 NOTE — TOC Progression Note (Signed)
Transition of Care [facility]) - Progression Note    Patient Details  Name: [name] MRN: [id number] Date of Birth: 11/14/1955  Transition of Care [facility]) CM/SW [place], [place] Phone Number: 06/28/2021, 12:44 PM  Clinical Narrative:     Called patient's wife- left voice message to return call  Thurmond Butts, MSW, LCSW Clinical Social Worker    Expected Discharge Plan: Skilled Nursing Facility Barriers to Discharge: Continued Medical Work up, Ship broker, SNF Pending bed offer  Expected Discharge Plan and Services Expected Discharge Plan: [place] In-house Referral: Clinical Social Work     Living arrangements for the past 2 months: Single Family Home                                       Social Determinants of Health (SDOH) Interventions    Readmission Risk Interventions No flowsheet data found.

## 2021-06-28 NOTE — Progress Notes (Signed)
Foley removed at 1530, pt due to void by 2130. Urinal given to  per his request.   Lasandra Beech

## 2021-06-28 NOTE — Progress Notes (Signed)
Occupational Therapy Treatment Patient Details Name: [name] MRN: [id number] DOB: Jan 09, 1956 Today's Date: 06/28/2021   History of present illness Pt is 65 yo male admitted on 06/13/21 with L foot infection, infected penile prosthesis, and sepsis.  He is s/p removal of penile prosthesis on 06/14/21.   Also, s/p L AT revascularization on 10/14. 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 and [name], paroxysmal A. fib on Eliquis, ESRD on hemodialysis TTS, chronic diastolic heart failure, diabetes type 2, hypertension, dyslipidemia   OT comments  Pt making great progress towards OT goals. Pt appears with improved mood as no significant surgical intervention was needed for L foot. Pt able to demo standing with RW and crutches (pt typically uses crutches at home to access bathroom). Pt reports feeling more steady with crutches and able to take pivotal steps (typically pivots to/from power chair at home) without physical assist. No overt LOB or safety concerns noted. Pt does require increased assist for LB ADLs though reports wife does assist with this at home. Based on progress today, pt not significantly below functional baseline and appropriate to DC home with HHOT follow-up. Pt does report his current crutches are worn and may benefit from new pair if qualified.    Recommendations for follow up therapy are one component of a multi-disciplinary discharge planning process, led by the attending physician.  Recommendations may be updated based on patient status, additional functional criteria and insurance authorization.    Follow Up Recommendations  Home health OT;Supervision - Intermittent    Equipment Recommendations  Other (comment) (crutches)    Recommendations for Other Services      Precautions / Restrictions Precautions Precautions: Fall Precaution Comments: R BKA with  prosthetic Restrictions Weight Bearing Restrictions: No       Mobility Bed Mobility Overal bed mobility: Needs Assistance Bed Mobility: Supine to Sit     Supine to sit: Modified independent (Device/Increase time);HOB elevated     General bed mobility comments: use of bedrails    Transfers Overall transfer level: Needs assistance Equipment used: Rolling walker (2 wheeled);Crutches Transfers: Sit to/from Stand Sit to Stand: Min guard         General transfer comment: min guard for safety progressing to supervision. pt reports feeling more steady with crutches, no LOB noted.    Balance Overall balance assessment: Needs assistance Sitting-balance support: No upper extremity supported Sitting balance-Leahy Scale: Normal     Standing balance support: Bilateral upper extremity supported Standing balance-Leahy Scale: Fair Standing balance comment: fair static standing to place crutches under arm                           ADL either performed or assessed with clinical judgement   ADL Overall ADL's : Needs assistance/impaired         Upper Body Bathing: Minimal assistance;Bed level Upper Body Bathing Details (indicate cue type and reason): to bathe back sitting EOB         Lower Body Dressing: Moderate assistance;Sit to/from stand;Sitting/lateral leans Lower Body Dressing Details (indicate cue type and reason): able to don R prosthetic LE and assist with donning shoe around L heel. assist to don L sock and shoe around toes. Pt reports wife assists at times with these tasks at home  General ADL Comments: Session focused on simulating activities with DME that pt uses at home, able to demo standing and taking steps without physical assist RW vs crutches that pt uses at baseline     Vision   Vision Assessment?: No apparent visual deficits   Perception     Praxis      Cognition Arousal/Alertness: Awake/alert Behavior During Therapy:  WFL for tasks assessed/performed Overall Cognitive Status: Within Functional Limits for tasks assessed                                 General Comments: Pt more conversive today and responding to questions with increased detail. pleasant        Exercises     Shoulder Instructions       General Comments      Pertinent Vitals/ Pain       Pain Assessment: Faces Faces Pain Scale: Hurts a little bit Pain Location: neck, back , legs - chronic Pain Descriptors / Indicators: Discomfort Pain Intervention(s): Monitored during session;Limited activity within patient's tolerance  Home Living                                          Prior Functioning/Environment              Frequency  Min 2X/week        Progress Toward Goals  OT Goals(current goals can now be found in the care plan section)  Progress towards OT goals: Progressing toward goals  Acute Rehab OT Goals Patient Stated Goal: be able to go home OT Goal Formulation: With patient Time For Goal Achievement: 07/03/21 Potential to Achieve Goals: Fair ADL Goals Pt Will Perform Upper Body Bathing: sitting;with set-up Pt Will Perform Lower Body Bathing: with mod assist;sit to/from stand;sitting/lateral leans Pt Will Transfer to Toilet: with mod assist;with +2 assist;stand pivot transfer;bedside commode Pt/caregiver will Perform Home Exercise Program: Increased strength;Both right and left upper extremity;With theraband;Independently;With written HEP provided Additional ADL Goal #1: Pt to complete bed mobility at Min A in prep for ADL transfers  Plan Discharge plan needs to be updated    Co-evaluation                 AM-PAC OT "6 Clicks" Daily Activity     Outcome Measure   Help from another person eating meals?: None Help from another person taking care of personal grooming?: A Little Help from another person toileting, which includes using toliet, bedpan, or urinal?: A  Lot Help from another person bathing (including washing, rinsing, drying)?: A Lot Help from another person to put on and taking off regular upper body clothing?: A Little Help from another person to put on and taking off regular lower body clothing?: A Lot 6 Click Score: 16    End of Session Equipment Utilized During Treatment: Gait belt;Rolling walker;Other (comment) (crutches)  OT Visit Diagnosis: Unsteadiness on feet (R26.81);Other abnormalities of gait and mobility (R26.89);Muscle weakness (generalized) (M62.81)   Activity Tolerance Patient tolerated treatment well   Patient Left in bed;with call bell/phone within reach;with nursing/sitter in room   Nurse Communication Mobility status        Time: 9470-9628 OT Time Calculation (min): 31 min  Charges: OT General Charges $OT Visit: 1 Visit OT Treatments $Self Care/Home Management : 8-22 mins $Therapeutic Activity: 8-22  mins  Malachy Chamber, OTR/L Acute Rehab Services Office: [phone]   Layla Maw 06/28/2021, 3:19 PM

## 2021-06-28 NOTE — Progress Notes (Signed)
PROGRESS NOTE    [name]  YQM:[id number] DOB: 10-15-1955 DOA: 06/13/2021 PCP: Harold Bender, PA-C   Chief Complaint  Patient presents with   Wound Infection   Brief Narrative/Hospital Course:  [name], 65 y.o. 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 Sharol [name], Dr. Clyda [name] seen on 10/3 by Dr. Sharol [name] potential need for transtibial amputation and referred to Dr. Einar [name] for PVD/angiogram PAF on [name], ESRD on HD TTS, chronic diastolic CHF, B2WU, HTN, dyslipidemia sent from dialysis center for further evaluation of the wound due to increasing swelling, pain around the penis and the scrotum and had purulent discharge from the urethra and purulent secretion from penile prosthesis. Seen in the ED-found to have sepsis with leukocytosis lactic acidosis.  Urology consulted underwent removal of 3 piece inflatable penile prosthesis 10/5-and patient admitted on IV antibiotics.S/P removal of 3 piece inflatable penile prosthesis by Dr [name] 10/5.  JP drain has been removed.  Aerobic/anaerobic culture from 10/5 shows Enterococcus faecalis sensitive to ampicillin-urine culture 10/5 with same organism-patient has completed antibiotics from urology standpoint. He has left foot osteomyelitis, seen by Dr. Sharol [name] managed w/ antibiotics as above.  Subjective: Seen and examined this morning. Has no complaints. Overnight afebrile blood pressure stable.  He had dialysis yesterday. Reports that he spoke with Dr [name] this morning  Assessment & Plan:  Severe sepsis due to infected penile prosthesis: S/P removal of 3 piece inflatable penile prosthesis by Dr [name] 10/5.  JP drain has been removed.  Aerobic/anaerobic culture from 10/5 shows Enterococcus faecalis sensitive to ampicillin-urine culture 10/5 with same organism.  Blood culture no growth to date.  Repeat urine culture 10/12 no growth.  Off  fluconazole, on cefepime/vancomycin.Intermittent Foley LEAK+ on 10/12- added oxybutynin- urology advised to keep foley 7-10 days-no plan for surgery at this time per orthopedic we will discontinue Foley catheter for TURP.  Appreciate urology input.  Completed antibiotic from urology standpoint.  Recent Labs  Lab 06/24/21 0509 06/25/21 0532 06/26/21 0002 06/27/21 0131 06/28/21 0205  WBC 10.1 9.0 8.8 9.4 6.9  Left foot infection-abnormal signal abnormalities of cuboid worrisome for osteomyelitis, cellulitis and myositis PVD: Seen by Dr. Sharol [name], cardio-underwent angiogram 10/11 no significant iliofemoral disease patent left SFA stents, 2/3 vessel below the knee runoff to the foot, proximal occluded left AT.Left foot no palpable DP pulse.S/P left AT revascularization 10/14 a.m. by Dr. Patwardhan-cardiology arranging for outpatient duplex ultrasound and outpatient follow-up.CRP normal, sed rate 17. Discussed with Dr. Sharol [name] foot wound seems to be healing well advised to continue on doxycycline  x 10 days and OP f/u with him in 2 wks  CAD s/p PCI:f/b Dr. Einar [name]. No chest pain. Cont home meds-Lipitor Plavix.  IDDM with uncontrolled hyperglycemia-HbA1c 7.9.Had hypoglycemia 10/16-discontinued Premeal insulin, decreased Semeglee to 7 units blood sugar overall controlled.Keep on SSI.Monitor. Recent Labs  Lab 06/26/21 1940 06/27/21 0623 06/27/21 1551 06/27/21 2204 06/28/21 0605  GLUCAP 240* 172* 224* 215* 175*  XLK:GMWN controlled.Coreg discontinued due to bradycardia.  Discontinue heparin and resume [name].   Chronic pain:Continue fentanyl patch pain management.  Sinus bradycardia overnight in 40s to 50s 10/16-17 night-Coreg discontinued.HR-stable.  ESRD on HD TTS: Nephrology on board.  Had dialysis yesterday.  Hypertension/volume: BP is stable.  Coreg discontinued due to bradycardia 10/17-blood pressure appears to be overall stable on amlodipine. Cont volume management in HD.   Metabolic bone  disease: Calcium/phosphorus  stable continue home binders Anemia of chronic renal disease: On Aranesp q. Tuesday.  H&H is stable.  Monitor.  Recent Labs  Lab 06/24/21 0509 06/25/21 0532 06/26/21 0002 06/27/21 0131 06/28/21 0205  HGB 9.2* 9.6* 10.3* 9.5* 10.0*  HCT 27.7* 28.5* 31.7* 30.4* 30.6*    Class 1 Obesity:Patient's Body mass index is 33.37 kg/m. : Will benefit with PCP follow-up, weight loss  healthy lifestyle and outpatient sleep evaluation.  Goals of care palliative care on board remains full code.  Deconditioning/debility:will need a skilled nursing facility once cleared by Dr. Sharol [name]  DVT prophylaxis: SCD's Start: 06/23/21 2013 SCD's Start: 06/20/21 1108 SCDs Start: 06/14/21 0047 [name]  Code Status:   Code Status: Full Code Family Communication: plan of care discussed with patient at bedside. Status is: Inpatient  Remains inpatient appropriate because: Unsafe disposition Lives at home with wife and son Anticipated disposition to skilled nursing facility once bed available. He is medically stable for discharge.    Objective: Vitals: Today's Vitals   06/27/21 2326 06/28/21 0321 06/28/21 0700 06/28/21 0824  BP: (!) 150/51 (!) 157/59 (!) 161/53   Pulse: 65 60 60   Resp: 17 17 18    Temp: 99.6 F (37.6 C) 98.6 F (37 C) 98.7 F (37.1 C)   TempSrc: Oral Oral Oral   SpO2: 98% 98% 98%   Weight:  117.9 kg    Height:      PainSc:    0-No pain   Physical Examination: General exam: AAOx 3, pleasant comfortable HEENT:Oral mucosa moist, Ear/Nose WNL grossly, dentition normal. Respiratory system: bilaterally clear breath sounds, no use of accessory muscle Cardiovascular system: S1 & S2 +, No JVD,. Gastrointestinal system: Abdomen soft, NT,ND, BS+.  Surgical site is stable in place no drainage above the penile area. Nervous System:Alert, awake, moving extremities and grossly nonfocal Extremities: no edema, distal peripheral pulses palpable.  Skin: No rashes,no  icterus. MSK: Normal muscle bulk,tone, power  Foley in place, RLE prosthesis. Left foot wound+  Medications reviewed:  Scheduled Meds:  (feeding supplement) PROSource Plus  30 mL Oral BID BM   amLODipine  5 mg Oral Daily   apixaban  5 mg Oral BID   atorvastatin  40 mg Oral QHS   Chlorhexidine Gluconate Cloth  6 each Topical Q0600   clopidogrel  75 mg Oral Daily   collagenase   Topical Daily   darbepoetin (ARANESP) injection - DIALYSIS  100 mcg Intravenous Q Tue-HD   doxycycline  100 mg Oral Q12H   famotidine  20 mg Oral Daily   fentaNYL  1 patch Transdermal Q72H   insulin aspart  0-5 Units Subcutaneous QHS   insulin aspart  0-9 Units Subcutaneous TID WC   insulin glargine-yfgn  7 Units Subcutaneous Daily   loratadine  10 mg Oral Daily   multivitamin  1 tablet Oral QHS   oxybutynin  5 mg Oral TID   pantoprazole  40 mg Oral Daily   pneumococcal 23 valent vaccine  0.5 mL Intramuscular Tomorrow-1000   pregabalin  75 mg Oral Daily   rOPINIRole  2 mg Oral QHS   sodium chloride flush  3 mL Intravenous Q12H   sodium chloride flush  3 mL Intravenous Q12H   tamsulosin  0.4 mg Oral Daily   Continuous Infusions:  sodium chloride     sodium chloride     Diet Order             Diet renal/carb modified with fluid restriction Diet-HS Snack? Nothing; Fluid  restriction: 1200 mL Fluid; Room service appropriate? Yes; Fluid consistency: Thin  Diet effective now                   Intake/Output  Intake/Output Summary (Last 24 hours) at 06/28/2021 1037 Last data filed at 06/28/2021 0840 Gross per 24 hour  Intake --  Output 5450 ml  Net -5450 ml   Intake/Output from previous day: 10/18 0701 - 10/19 0700 In: -  Out: 5200 [Urine:1200] Net IO Since Admission: -8,002.53 mL [06/28/21 1037]   Weight change: -1.2 kg  Wt Readings from Last 3 Encounters:  06/28/21 117.9 kg  06/12/21 108.4 kg  05/01/21 94.8 kg     Consultants:see note  Procedures:see  note Antimicrobials: Anti-infectives (From admission, onward)    Start     Dose/Rate Route Frequency Ordered Stop   06/28/21 1000  doxycycline (VIBRA-TABS) tablet 100 mg        100 mg Oral Every 12 hours 06/28/21 0850 07/07/21 0959   06/15/21 2200  fluconazole (DIFLUCAN) IVPB 200 mg  Status:  Discontinued        200 mg 100 mL/hr over 60 Minutes Intravenous Daily at bedtime 06/14/21 0017 06/17/21 0716   06/15/21 1800  ceFEPIme (MAXIPIME) 2 g in sodium chloride 0.9 % 100 mL IVPB  Status:  Discontinued        2 g 200 mL/hr over 30 Minutes Intravenous Every T-Th-Sa (1800) 06/14/21 6195 06/28/21 0850   06/15/21 1200  ceFEPIme (MAXIPIME) 2 g in sodium chloride 0.9 % 100 mL IVPB  Status:  Discontinued        2 g 200 mL/hr over 30 Minutes Intravenous Every T-Th-Sa (Hemodialysis) 06/14/21 0025 06/14/21 0922   06/15/21 1200  vancomycin (VANCOCIN) IVPB 1000 mg/200 mL premix  Status:  Discontinued        1,000 mg 200 mL/hr over 60 Minutes Intravenous Every T-Th-Sa (Hemodialysis) 06/14/21 0025 06/28/21 0850   06/14/21 1000  metroNIDAZOLE (FLAGYL) IVPB 500 mg        500 mg 100 mL/hr over 60 Minutes Intravenous Every 12 hours 06/14/21 0025 06/21/21 0959   06/14/21 0030  fluconazole (DIFLUCAN) IVPB 400 mg        400 mg 100 mL/hr over 120 Minutes Intravenous STAT 06/14/21 0017 06/14/21 0400   06/13/21 2048  vancomycin variable dose per unstable renal function (pharmacist dosing)  Status:  Discontinued         Does not apply See admin instructions 06/13/21 2048 06/14/21 0030   06/13/21 2030  ceFEPIme (MAXIPIME) 2 g in sodium chloride 0.9 % 100 mL IVPB        2 g 200 mL/hr over 30 Minutes Intravenous  Once 06/13/21 2017 06/13/21 2145   06/13/21 2030  metroNIDAZOLE (FLAGYL) IVPB 500 mg        500 mg 100 mL/hr over 60 Minutes Intravenous  Once 06/13/21 2017 06/13/21 2157   06/13/21 2030  vancomycin (VANCOREADY) IVPB 2000 mg/400 mL        2,000 mg 200 mL/hr over 120 Minutes Intravenous  Once 06/13/21  2017 06/14/21 0107      Culture/Microbiology    Component Value Date/Time   SDES URINE, CATHETERIZED 06/21/2021 1416   SPECREQUEST NONE 06/21/2021 1416   CULT  06/21/2021 1416    NO GROWTH Performed at [facility], [place] [address], [place], [place] [address]    REPTSTATUS 06/22/2021 FINAL 06/21/2021 1416    Other culture-see note  Unresulted Labs (From admission, onward)  Start     Ordered   06/29/21 0500  CBC  Tomorrow morning,   R       Question:  Specimen collection method  Answer:  Lab=Lab collect   06/28/21 0909   06/27/21 5170  Basic metabolic panel  Daily,   R     Question:  Specimen collection method  Answer:  Lab=Lab collect   06/26/21 1031           Data Reviewed: I have personally reviewed following labs and imaging studies CBC: Recent Labs  Lab 06/24/21 0509 06/25/21 0532 06/26/21 0002 06/27/21 0131 06/28/21 0205  WBC 10.1 9.0 8.8 9.4 6.9  HGB 9.2* 9.6* 10.3* 9.5* 10.0*  HCT 27.7* 28.5* 31.7* 30.4* 30.6*  MCV 91.4 93.1 93.5 97.1 94.4  PLT 212 184 184 164 017*   Basic Metabolic Panel: Recent Labs  Lab 06/24/21 0743 06/25/21 0532 06/26/21 0002 06/27/21 0131 06/28/21 0205  NA 133* 132* 132* 130* 134*  K 4.4 3.9 4.8 4.5 4.0  CL 97* 96* 97* 97* 97*  CO2 25 25 25 23 27   GLUCOSE 146* 278* 232* 184* 198*  BUN 36* 22 27* 41* 27*  CREATININE 4.76* 3.60* 4.08* 5.24* 4.04*  CALCIUM 8.3* 7.6* 7.8* 7.5* 7.8*   GFR: Estimated Creatinine Clearance: 24.2 mL/min (A) (by C-G formula based on SCr of 4.04 mg/dL (H)). Liver Function Tests: No results for input(s): AST, ALT, ALKPHOS, BILITOT, PROT, ALBUMIN in the last 168 hours.  No results for input(s): LIPASE, AMYLASE in the last 168 hours. No results for input(s): AMMONIA in the last 168 hours. Coagulation Profile: No results for input(s): INR, PROTIME in the last 168 hours. Cardiac Enzymes: No results for input(s): CKTOTAL, CKMB, CKMBINDEX, TROPONINI in the last 168 hours. BNP (last 3  results) No results for input(s): PROBNP in the last 8760 hours. HbA1C: No results for input(s): HGBA1C in the last 72 hours. CBG: Recent Labs  Lab 06/26/21 1940 06/27/21 0623 06/27/21 1551 06/27/21 2204 06/28/21 0605  GLUCAP 240* 172* 224* 215* 175*   Lipid Profile: No results for input(s): CHOL, HDL, LDLCALC, TRIG, CHOLHDL, LDLDIRECT in the last 72 hours. Thyroid Function Tests: No results for input(s): TSH, T4TOTAL, FREET4, T3FREE, THYROIDAB in the last 72 hours. Anemia Panel: No results for input(s): VITAMINB12, FOLATE, FERRITIN, TIBC, IRON, RETICCTPCT in the last 72 hours. Sepsis Labs: No results for input(s): PROCALCITON, LATICACIDVEN in the last 168 hours.  Recent Results (from the past 240 hour(s))  Urine Culture     Status: None   Collection Time: 06/21/21  2:16 PM   Specimen: Urine, Catheterized  Result Value Ref Range Status   Specimen Description URINE, CATHETERIZED  Final   Special Requests NONE  Final   Culture   Final    NO GROWTH Performed at [facility], [address], [place], [place] [address]    Report Status 06/22/2021 FINAL  Final      Radiology Studies: No results found.   LOS: 14 days   Antonieta Pert, MD [facility]  06/28/2021, 10:38 AM

## 2021-06-28 NOTE — Plan of Care (Signed)
  Problem: Education: Goal: Required Educational Video(s) Outcome: Progressing

## 2021-06-28 NOTE — Plan of Care (Signed)
  Problem: Education: Goal: Required Educational Video(s) 06/28/2021 0842 by Lasandra Beech, RN Outcome: Progressing 06/28/2021 0839 by Lasandra Beech, RN Outcome: Progressing

## 2021-06-28 NOTE — Progress Notes (Signed)
Patient ID: [name], male   DOB: 09/17/1955, 65 y.o.   MRN: [id number] Patient is status post peripheral revascularization to the left lower extremity.  The necrotic ulcers over the base of the fifth metatarsal and over the lateral malleolus show excellent improvement.  I will follow-up in the office in 2 weeks.  Impressive improvement of the microcirculation to the left foot.

## 2021-06-28 NOTE — Progress Notes (Signed)
[facility] Progress Note   Subjective:  Seen in room. No c/o's.  4.8 L off w/ HD yesterday. BP's still up a bit.   Objective Vitals:   06/27/21 2326 06/28/21 0321 06/28/21 0700 06/28/21 1127  BP: (!) 150/51 (!) 157/59 (!) 161/53 (!) 163/60  Pulse: 65 60 60 60  Resp: 17 17 18 19   Temp: 99.6 F (37.6 C) 98.6 F (37 C) 98.7 F (37.1 C) 98.5 F (36.9 C)  TempSrc: Oral Oral Oral Oral  SpO2: 98% 98% 98% 98%  Weight:  117.9 kg    Height:       Physical Exam General: Well appearing man, NAD Heart: RRR; no murmur Lungs: CTA anteriorly Abdomen: soft GU: Foley in place, suprapubic staples in place Extremities: R BKA,  UE edema / LE edema better today Dialysis Access: LUE AVF + thrill  Dialysis Orders: [facility]   4h 53min  450/800  108kg  2/2 bath  AVF  Hep none  - Mircera 180mcg IV q 2 wks (last 9/27) - Hectoral 42mcg IV q HD   Assessment/Plan:  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  Urine cx growing enterococcus faecalis. Remains on Vanc/Cefepime.   ESRD: Continue usual HD TTS schedule. HD tomorrow  HTN/volume: BP wnl, weights up, cont more aggressive UF this wk  Anemia: Hgb 9.6 - continue Aranesp q Tues   Metabolic bone disease: CorrCa/Phos ok, continue home binders  Nutrition:  Alb low, continue protein supplements.  T2DM:  per pmd  PAD: Arteriogram 10/11, s/p PTA to anterior tibial artery 10/4 with cardiology Dispo - was living at home, now waiting for dc to SNF when bed available   Kelly Splinter, MD 06/27/2021, 11:50 AM      Additional Objective Labs: Basic Metabolic Panel: Recent Labs  Lab 06/26/21 0002 06/27/21 0131 06/28/21 0205  NA 132* 130* 134*  K 4.8 4.5 4.0  CL 97* 97* 97*  CO2 25 23 27   GLUCOSE 232* 184* 198*  BUN 27* 41* 27*  CREATININE 4.08* 5.24* 4.04*  CALCIUM 7.8* 7.5* 7.8*    Liver Function Tests: No results for  input(s): AST, ALT, ALKPHOS, BILITOT, PROT, ALBUMIN in the last 168 hours.  CBC: Recent Labs  Lab 06/24/21 0509 06/25/21 0532 06/26/21 0002 06/27/21 0131 06/28/21 0205  WBC 10.1 9.0 8.8 9.4 6.9  HGB 9.2* 9.6* 10.3* 9.5* 10.0*  HCT 27.7* 28.5* 31.7* 30.4* 30.6*  MCV 91.4 93.1 93.5 97.1 94.4  PLT 212 184 184 164 144*    Medications:  sodium chloride     sodium chloride      (feeding supplement) PROSource Plus  30 mL Oral BID BM   amLODipine  5 mg Oral Daily   apixaban  5 mg Oral BID   atorvastatin  40 mg Oral QHS   Chlorhexidine Gluconate Cloth  6 each Topical Q0600   clopidogrel  75 mg Oral Daily   collagenase   Topical Daily   darbepoetin (ARANESP) injection - DIALYSIS  100 mcg Intravenous Q Tue-HD   doxycycline  100 mg Oral Q12H   famotidine  20 mg Oral Daily   fentaNYL  1 patch Transdermal Q72H   insulin aspart  0-5 Units Subcutaneous QHS   insulin aspart  0-9 Units Subcutaneous TID WC   insulin glargine-yfgn  7 Units Subcutaneous Daily   loratadine  10 mg Oral Daily   multivitamin  1 tablet  Oral QHS   oxybutynin  5 mg Oral TID   pantoprazole  40 mg Oral Daily   pneumococcal 23 valent vaccine  0.5 mL Intramuscular Tomorrow-1000   pregabalin  75 mg Oral Daily   rOPINIRole  2 mg Oral QHS   sodium chloride flush  3 mL Intravenous Q12H   sodium chloride flush  3 mL Intravenous Q12H   tamsulosin  0.4 mg Oral Daily

## 2021-06-28 NOTE — Progress Notes (Signed)
Inpatient Diabetes Program Recommendations  AACE/ADA: New Consensus Statement on Inpatient Glycemic Control   Target Ranges:  Prepandial:   less than 140 mg/dL      Peak postprandial:   less than 180 mg/dL (1-2 hours)      Critically ill patients:  140 - 180 mg/dL   Results for [name], [name] (MRN [id number]) as of 06/28/2021 11:16  Ref. Range 06/27/2021 06:23 06/27/2021 15:51 06/27/2021 22:04 06/28/2021 06:05  Glucose-Capillary Latest Ref Range: 70 - 99 mg/dL 172 (H) 224 (H) 215 (H) 175 (H)   Review of Glycemic Control  Current orders for Inpatient glycemic control: Semglee 7 units daily, Novolog 0-9 units TID with meals, Novolog 0-5 units QHS  Inpatient Diabetes Program Recommendations:    Insulin: Please consider ordering Novolog 3 units TID with meals for meal coverage if patient eats at least 50% of meals.  Thanks, Barnie Alderman, RN, MSN, CDE Diabetes Coordinator Inpatient Diabetes Program [phone] (Team Pager from 8am to 5pm)

## 2021-06-29 DIAGNOSIS — E872 Acidosis, unspecified: Secondary | ICD-10-CM | POA: Diagnosis not present

## 2021-06-29 DIAGNOSIS — A419 Sepsis, unspecified organism: Secondary | ICD-10-CM | POA: Diagnosis not present

## 2021-06-29 DIAGNOSIS — R6520 Severe sepsis without septic shock: Secondary | ICD-10-CM | POA: Diagnosis not present

## 2021-06-29 LAB — BASIC METABOLIC PANEL
Anion gap: 10 (ref 5–15)
BUN: 40 mg/dL — ABNORMAL HIGH (ref 8–23)
CO2: 24 mmol/L (ref 22–32)
Calcium: 8.1 mg/dL — ABNORMAL LOW (ref 8.9–10.3)
Chloride: 98 mmol/L (ref 98–111)
Creatinine, Ser: 5.18 mg/dL — ABNORMAL HIGH (ref 0.61–1.24)
GFR, Estimated: 11 mL/min — ABNORMAL LOW (ref >60)
Glucose, Bld: 188 mg/dL — ABNORMAL HIGH (ref 70–99)
Potassium: 4 mmol/L (ref 3.5–5.1)
Sodium: 132 mmol/L — ABNORMAL LOW (ref 135–145)

## 2021-06-29 LAB — CBC
HCT: 29.1 % — ABNORMAL LOW (ref 39.0–52.0)
Hemoglobin: 9.5 g/dL — ABNORMAL LOW (ref 13.0–17.0)
MCH: 31.1 pg (ref 26.0–34.0)
MCHC: 32.6 g/dL (ref 30.0–36.0)
MCV: 95.4 fL (ref 80.0–100.0)
Platelets: 132 10*3/uL — ABNORMAL LOW (ref 150–400)
RBC: 3.05 MIL/uL — ABNORMAL LOW (ref 4.22–5.81)
RDW: 15.5 % (ref 11.5–15.5)
WBC: 7.8 10*3/uL (ref 4.0–10.5)
nRBC: 0 % (ref 0.0–0.2)

## 2021-06-29 LAB — GLUCOSE, CAPILLARY
Glucose-Capillary: 190 mg/dL — ABNORMAL HIGH (ref 70–99)
Glucose-Capillary: 168 mg/dL — ABNORMAL HIGH (ref 70–99)
Glucose-Capillary: 156 mg/dL — ABNORMAL HIGH (ref 70–99)
Glucose-Capillary: 204 mg/dL — ABNORMAL HIGH (ref 70–99)
Glucose-Capillary: 216 mg/dL — ABNORMAL HIGH (ref 70–99)

## 2021-06-29 MED ORDER — VANCOMYCIN HCL IN DEXTROSE 1-5 GM/200ML-% IV SOLN
INTRAVENOUS | Status: AC
  Filled 2021-06-29: qty 200

## 2021-06-29 MED ORDER — INSULIN ASPART 100 UNIT/ML IJ SOLN
5.0000 [IU] | Freq: Three times a day (TID) | INTRAMUSCULAR | Status: DC
  Administered 2021-06-30: 5 [IU] via SUBCUTANEOUS

## 2021-06-29 NOTE — Progress Notes (Signed)
Contacted by MD regarding pt stating he does not have transport to HD on Saturday. Navigator contacted [facility] and spoke to SW. Pt receives transportation services through [facility] Message left for Durenda Guthrie [phone]) with [facility] [employer] to request transportation for pt on Saturday for out-pt HD treatment. Awaiting a return call. Will follow and assist.    Melven Sartorius Renal Navigator [phone]

## 2021-06-29 NOTE — Progress Notes (Signed)
PROGRESS NOTE    [name]  WYO:[id number] DOB: 02-02-1956 DOA: 06/13/2021 PCP: Harold Bender, PA-C   Chief Complaint  Patient presents with   Wound Infection   Brief Narrative/Hospital Course:  [name], 65 y.o. 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 Sharol [name], Harold. Clyda [name] seen on 10/3 by Harold. Sharol [name] potential need for transtibial amputation and referred to Harold. Einar [name] for PVD/angiogram PAF on Eliquis, ESRD on HD TTS, chronic diastolic CHF, D7AJ, HTN, dyslipidemia sent from dialysis center for further evaluation of the wound due to increasing swelling, pain around the penis and the scrotum and had purulent discharge from the urethra and purulent secretion from penile prosthesis. Seen in the ED-found to have sepsis with leukocytosis lactic acidosis.  Urology consulted underwent removal of 3 piece inflatable penile prosthesis 10/5-and patient admitted on IV antibiotics.S/P removal of 3 piece inflatable penile prosthesis by [name] 10/5.  JP drain has been removed.  Aerobic/anaerobic culture from 10/5 shows Enterococcus faecalis sensitive to ampicillin-urine culture 10/5 with same organism-patient has completed antibiotics from urology standpoint. He has left foot osteomyelitis, seen by Harold. Sharol [name] managed w/ antibiotics Left foot wound has significantly improved, changed to doxycycline x10 days.  At this time patient is waiting for placement to skilled nursing facility  Subjective: Seen this morning in dialysis alert awake. Overnight afebrile blood pressure stable, heart rate 57-73g  Assessment & Plan:  Severe sepsis due to infected penile prosthesis:S/P removal of 3 piece inflatable penile prosthesis by [name] 10/5.  JP drain has been removed.Aerobic/anaerobic culture from 10/5 shows Enterococcus faecalis sensitive to ampicillin-urine culture 10/5 with same organism.  Blood  culture no growth to date.  Repeat urine culture 10/12 no growth.Patient completed antibiotics for urine infection.  Foley catheter discontinued 10/19-but reinserted as he failed TOV-he will need outpatient follow-up with urology to remove the staples and the Foley catheter. Recent Labs  Lab 06/25/21 0532 06/26/21 0002 06/27/21 0131 06/28/21 0205 06/29/21 0233  WBC 9.0 8.8 9.4 6.9 7.8   Left foot infection-abnormal signal abnormalities of cuboid worrisome for osteomyelitis, cellulitis and myositis/PVD: Seen by Harold. Sharol [name], cardiology-underwent angiogram 10/11 no significant iliofemoral disease patent left SFA stents, 2/3 vessel below the knee runoff to the foot, proximal occluded left AT.Left foot no palpable DP pulse.S/P left AT revascularization 10/14 a.m. by Harold. Patwardhan-cardiology arranging for outpatient duplex ultrasound and outpatient follow-up. CRP normal, sed rate 17. Discussed with Harold. Sharol [name] 10/19- advised to continue on doxycycline x10 days and OP f/u with him in 2 wks  CAD s/p PCI:f/b Harold. Einar [name].  Currently no chest pain. Cont home meds-Lipitor Plavix.  IDDM with uncontrolled hyperglycemia-HbA1c 7.9.Had hypoglycemia 10/16-blood sugar fairly stable on [name] 7 units, had 5 units 3 times daily Premeal, continue SSI.   Recent Labs  Lab 06/28/21 0605 06/28/21 1125 06/28/21 1609 06/28/21 2010 06/29/21 0604  GLUCAP 175* 189* 214* 278* 168*   OIN:OMVE controlled.Coreg discontinued due to bradycardia.  Back on Eliquis 1-/19 from heparin gtt.   Chronic pain:Continue fentanyl patch pain management.  Sinus bradycardia overnight in 40s to 50s 10/16-17 night-Coreg discontinued.HR-stable.  ESRD on HD TTS: Managed by nephrology, had HD 10/18.  Hypertension/volume: BP is stable.  Coreg discontinued due to bradycardia 10/17-continue amlodipine,Cont volume management in HD.   Metabolic bone disease: Calcium/phosphorus stable- continue home binders Anemia of chronic renal disease: On Aranesp  q. Tuesday.  Hemoglobin is stable.  Monitor.  Recent Labs  Lab 06/25/21 0532 06/26/21 0002 06/27/21 0131 06/28/21 0205 06/29/21 0233  HGB 9.6* 10.3* 9.5* 10.0* 9.5*  HCT 28.5* 31.7* 30.4* 30.6* 29.1*     Class 1 Obesity:Patient's Body mass index is 33.88 kg/m. : Will benefit with PCP follow-up, weight loss  healthy lifestyle and outpatient sleep evaluation.  Goals of care palliative care on board remains full code.  Deconditioning/debility:will need a skilled nursing facility and is medically stable for discharge once bed available  DVT prophylaxis: SCD's Start: 06/23/21 2013 SCD's Start: 06/20/21 1108 SCDs Start: 06/14/21 0047 Eliquis  Code Status:   Code Status: Full Code Family Communication: plan of care discussed with patient at bedside. Status is: Inpatient  Remains inpatient appropriate because: Unsafe disposition Lives at home with wife and son Anticipated disposition to skilled nursing facility -if declines then home  Patient reports that he would like to go home on Saturday due to transportation issues. PT eval pending.  Objective: Vitals: Today's Vitals   06/28/21 2220 06/28/21 2305 06/29/21 0056 06/29/21 0313  BP:   (!) 143/56 (!) 141/56  Pulse:   70 70  Resp:   20 20  Temp:   98.7 F (37.1 C) 98.7 F (37.1 C)  TempSrc:   Oral Oral  SpO2:   98% 98%  Weight:    119.7 kg  Height:      PainSc: 7  0-No pain     Physical Examination: General exam: AAOx 3,pleasant, older than stated age, weak appearing. HEENT:Oral mucosa moist, Ear/Nose WNL grossly, dentition normal. Respiratory system: bilaterally diminished, no use of accessory muscle. Cardiovascular system: S1 & S2 +, No JVD.  Surgical site is stapled clean and dry Gastrointestinal system: Abdomen soft, NT,ND, BS+. Nervous System:Alert, awake, moving extremities and grossly nonfocal. Extremities:Right BKA with prosthesis,distal peripheral pulses palpable.  Skin: No rashes,no icterus. MSK: Normal muscle  bulk,tone,power.   Medications reviewed:  Scheduled Meds:  (feeding supplement) PROSource Plus  30 mL Oral BID BM   amLODipine  5 mg Oral Daily   apixaban  5 mg Oral BID   atorvastatin  40 mg Oral QHS   Chlorhexidine Gluconate Cloth  6 each Topical Q0600   clopidogrel  75 mg Oral Daily   collagenase   Topical Daily   darbepoetin (ARANESP) injection - DIALYSIS  100 mcg Intravenous Q Tue-HD   doxycycline  100 mg Oral Q12H   famotidine  20 mg Oral Daily   fentaNYL  1 patch Transdermal Q72H   insulin aspart  0-5 Units Subcutaneous QHS   insulin aspart  0-9 Units Subcutaneous TID WC   insulin aspart  5 Units Subcutaneous TID WC   insulin glargine-yfgn  7 Units Subcutaneous Daily   loratadine  10 mg Oral Daily   multivitamin  1 tablet Oral QHS   oxybutynin  5 mg Oral TID   pantoprazole  40 mg Oral Daily   pneumococcal 23 valent vaccine  0.5 mL Intramuscular Tomorrow-1000   pregabalin  75 mg Oral Daily   rOPINIRole  2 mg Oral QHS   sodium chloride flush  3 mL Intravenous Q12H   sodium chloride flush  3 mL Intravenous Q12H   tamsulosin  0.4 mg Oral Daily   Continuous Infusions:  sodium chloride     sodium chloride     Diet Order             Diet renal/carb modified with fluid restriction Diet-HS Snack? Nothing; Fluid restriction: 1200  mL Fluid; Room service appropriate? Yes; Fluid consistency: Thin  Diet effective now                   Intake/Output  Intake/Output Summary (Last 24 hours) at 06/29/2021 0726 Last data filed at 06/29/2021 2376 Gross per 24 hour  Intake 300 ml  Output 2250 ml  Net -1950 ml    Intake/Output from previous day: 10/19 0701 - 10/20 0700 In: 300 [P.O.:300] Out: 2250 [Urine:2250] Net IO Since Admission: -9,702.53 mL [06/29/21 0726]   Weight change: 1.3 kg  Wt Readings from Last 3 Encounters:  06/29/21 119.7 kg  06/12/21 108.4 kg  05/01/21 94.8 kg     Consultants:see note  Procedures:see note Antimicrobials: Anti-infectives (From  admission, onward)    Start     Dose/Rate Route Frequency Ordered Stop   06/28/21 1000  doxycycline (VIBRA-TABS) tablet 100 mg        100 mg Oral Every 12 hours 06/28/21 0850 07/07/21 0959   06/15/21 2200  fluconazole (DIFLUCAN) IVPB 200 mg  Status:  Discontinued        200 mg 100 mL/hr over 60 Minutes Intravenous Daily at bedtime 06/14/21 0017 06/17/21 0716   06/15/21 1800  ceFEPIme (MAXIPIME) 2 g in sodium chloride 0.9 % 100 mL IVPB  Status:  Discontinued        2 g 200 mL/hr over 30 Minutes Intravenous Every T-Th-Sa (1800) 06/14/21 2831 06/28/21 0850   06/15/21 1200  ceFEPIme (MAXIPIME) 2 g in sodium chloride 0.9 % 100 mL IVPB  Status:  Discontinued        2 g 200 mL/hr over 30 Minutes Intravenous Every T-Th-Sa (Hemodialysis) 06/14/21 0025 06/14/21 0922   06/15/21 1200  vancomycin (VANCOCIN) IVPB 1000 mg/200 mL premix  Status:  Discontinued        1,000 mg 200 mL/hr over 60 Minutes Intravenous Every T-Th-Sa (Hemodialysis) 06/14/21 0025 06/28/21 0850   06/14/21 1000  metroNIDAZOLE (FLAGYL) IVPB 500 mg        500 mg 100 mL/hr over 60 Minutes Intravenous Every 12 hours 06/14/21 0025 06/21/21 0959   06/14/21 0030  fluconazole (DIFLUCAN) IVPB 400 mg        400 mg 100 mL/hr over 120 Minutes Intravenous STAT 06/14/21 0017 06/14/21 0400   06/13/21 2048  vancomycin variable dose per unstable renal function (pharmacist dosing)  Status:  Discontinued         Does not apply See admin instructions 06/13/21 2048 06/14/21 0030   06/13/21 2030  ceFEPIme (MAXIPIME) 2 g in sodium chloride 0.9 % 100 mL IVPB        2 g 200 mL/hr over 30 Minutes Intravenous  Once 06/13/21 2017 06/13/21 2145   06/13/21 2030  metroNIDAZOLE (FLAGYL) IVPB 500 mg        500 mg 100 mL/hr over 60 Minutes Intravenous  Once 06/13/21 2017 06/13/21 2157   06/13/21 2030  vancomycin (VANCOREADY) IVPB 2000 mg/400 mL        2,000 mg 200 mL/hr over 120 Minutes Intravenous  Once 06/13/21 2017 06/14/21 0107       Culture/Microbiology    Component Value Date/Time   SDES URINE, CATHETERIZED 06/21/2021 1416   [place] 06/21/2021 1416   CULT  06/21/2021 1416    NO GROWTH Performed at [facility], [place] [address] [place], [place] [address]    REPTSTATUS 06/22/2021 FINAL 06/21/2021 1416    Other culture-see note  Unresulted Labs (From admission, onward)  None      Data Reviewed: I have personally reviewed following labs and imaging studies CBC: Recent Labs  Lab 06/25/21 0532 06/26/21 0002 06/27/21 0131 06/28/21 0205 06/29/21 0233  WBC 9.0 8.8 9.4 6.9 7.8  HGB 9.6* 10.3* 9.5* 10.0* 9.5*  HCT 28.5* 31.7* 30.4* 30.6* 29.1*  MCV 93.1 93.5 97.1 94.4 95.4  PLT 184 184 164 144* 132*    Basic Metabolic Panel: Recent Labs  Lab 06/25/21 0532 06/26/21 0002 06/27/21 0131 06/28/21 0205 06/29/21 0233  NA 132* 132* 130* 134* 132*  K 3.9 4.8 4.5 4.0 4.0  CL 96* 97* 97* 97* 98  CO2 25 25 23 27 24   GLUCOSE 278* 232* 184* 198* 188*  BUN 22 27* 41* 27* 40*  CREATININE 3.60* 4.08* 5.24* 4.04* 5.18*  CALCIUM 7.6* 7.8* 7.5* 7.8* 8.1*    GFR: Estimated Creatinine Clearance: 19 mL/min (A) (by C-G formula based on SCr of 5.18 mg/dL (H)). Liver Function Tests: No results for input(s): AST, ALT, ALKPHOS, BILITOT, PROT, ALBUMIN in the last 168 hours.  No results for input(s): LIPASE, AMYLASE in the last 168 hours. No results for input(s): AMMONIA in the last 168 hours. Coagulation Profile: No results for input(s): INR, PROTIME in the last 168 hours. Cardiac Enzymes: No results for input(s): CKTOTAL, CKMB, CKMBINDEX, TROPONINI in the last 168 hours. BNP (last 3 results) No results for input(s): PROBNP in the last 8760 hours. HbA1C: No results for input(s): HGBA1C in the last 72 hours. CBG: Recent Labs  Lab 06/28/21 0605 06/28/21 1125 06/28/21 1609 06/28/21 2010 06/29/21 0604  GLUCAP 175* 189* 214* 278* 168*    Lipid Profile: No results for input(s): CHOL, HDL,  LDLCALC, TRIG, CHOLHDL, LDLDIRECT in the last 72 hours. Thyroid Function Tests: No results for input(s): TSH, T4TOTAL, FREET4, T3FREE, THYROIDAB in the last 72 hours. Anemia Panel: No results for input(s): VITAMINB12, FOLATE, FERRITIN, TIBC, IRON, RETICCTPCT in the last 72 hours. Sepsis Labs: No results for input(s): PROCALCITON, LATICACIDVEN in the last 168 hours.  Recent Results (from the past 240 hour(s))  Urine Culture     Status: None   Collection Time: 06/21/21  2:16 PM   Specimen: Urine, Catheterized  Result Value Ref Range Status   Specimen Description URINE, CATHETERIZED  Final   Special Requests NONE  Final   Culture   Final    NO GROWTH Performed at [facility], [address], [place], [place] [address]    Report Status 06/22/2021 FINAL  Final      Radiology Studies: No results found.   LOS: 15 days   Antonieta Pert, MD [facility]  06/29/2021, 7:26 AM

## 2021-06-29 NOTE — Progress Notes (Signed)
Physical Therapy Treatment Patient Details Name: [name] MRN: [id number] DOB: 1956-01-16 Today's Date: 06/29/2021   History of Present Illness Pt is 65 yo male admitted 06/13/21 with L foot infection, infected penile prosthesis, and sepsis. S/p removal of penile prosthesis on 10/5. S/p L AT revascularization on 10/14. PMH includes CAD, PAD, right BKA, multiple L foot toe amputations, PAF on Eliquis, ESRD (HD TTS), HF, DM2, HTN.   PT Comments    Pt progressing with mobility. Pt mod indep with bed mobility, declining OOB activity secondary to fatigue post-HD. Increased time discussing d/c recommendations, SNF vs. HHPT, as pt wanting to return home. Educ re: DME needs, assist needs, fall risk reduction, activity recommendations, therex (HEP handout provided). Pt reports family available for necessary assist. Discharge recommendations updated for HHPT services to maximize functional mobility and independence (case manager notified).    Recommendations for follow up therapy are one component of a multi-disciplinary discharge planning process, led by the attending physician.  Recommendations may be updated based on patient status, additional functional criteria and insurance authorization.  Follow Up Recommendations  Home health PT;Supervision for mobility/OOB     Equipment Recommendations  None recommended by PT (crutches delivered)    Recommendations for Other Services       Precautions / Restrictions Precautions Precautions: Fall;Other (comment) Precaution Comments: H/o R BKA (prosthetic in room) Restrictions Weight Bearing Restrictions: No     Mobility  Bed Mobility Overal bed mobility: Modified Independent             General bed mobility comments: Mod indep for repositioning and long sitting in bed with HOB elevated, use of bed rails    Transfers                 General transfer comment:  (pt declined secondary to fatigue post-HD)  Ambulation/Gait                  Stairs             Wheelchair Mobility    Modified Rankin (Stroke Patients Only)       Balance                                            Cognition Arousal/Alertness: Awake/alert Behavior During Therapy: WFL for tasks assessed/performed Overall Cognitive Status: Within Functional Limits for tasks assessed                                        Exercises Other Exercises Other Exercises: [employer] HEP handout (Access Code [email or web address]) provided - single leg glut bridge with R knee extension, SLR, adductor ball/towel squeeze, glut squeeze    General Comments General comments (skin integrity, edema, etc.): Bilateral axillary crutches ordered, adjusted for pt's height; pt declined gait training with new crutches due to fatigue post-HD. Increased time discussing d/c plan (home vs. SNF), pt with preference for home and agreeable for HHPT services; discussed home set-up and potential DME needs in detail, pt ultimately only needing crutches, will have necessary assist at home; pt reports no concerns      Pertinent Vitals/Pain Pain Assessment: Faces Faces Pain Scale: Hurts a little bit Pain Location: Generalized Pain Descriptors / Indicators: Discomfort Pain Intervention(s): Monitored during session;Limited activity within patient's tolerance  Home Living                      Prior Function            PT Goals (current goals can now be found in the care plan section) Acute Rehab PT Goals Patient Stated Goal: Home instead of SNF Potential to Achieve Goals: Good Progress towards PT goals: Progressing toward goals    Frequency    Min 3X/week      PT Plan Discharge plan needs to be updated;Frequency needs to be updated    Co-evaluation              AM-PAC PT "6 Clicks" Mobility   Outcome Measure  Help needed turning from your back to your side while in a flat bed without using bedrails?: None Help  needed moving from lying on your back to sitting on the side of a flat bed without using bedrails?: None Help needed moving to and from a bed to a chair (including a wheelchair)?: A Little Help needed standing up from a chair using your arms (e.g., wheelchair or bedside chair)?: A Little Help needed to walk in hospital room?: A Lot Help needed climbing 3-5 steps with a railing? : A Lot 6 Click Score: 18    End of Session   Activity Tolerance: Patient limited by fatigue Patient left: in bed;with call bell/phone within reach Nurse Communication: Mobility status PT Visit Diagnosis: Other abnormalities of gait and mobility (R26.89);Muscle weakness (generalized) (M62.81)     Time: 2902-1115 PT Time Calculation (min) (ACUTE ONLY): 15 min  Charges:  $Self Care/Home Management: [place], PT, DPT Acute Rehabilitation Services  Pager [phone] Office [place] 06/29/2021, 12:54 PM

## 2021-06-29 NOTE — Progress Notes (Signed)
PT Cancellation Note  Patient Details Name: DEVARIO BUCKLEW MRN: [id number] DOB: 1955/11/08   Cancelled Treatment:    Reason Eval/Treat Not Completed: Patient at procedure or test/unavailable (HD). Will follow-up for PT treatment as schedule permits. Ordered pair of tall crutches from ortho tech to be delivered to room.  Mabeline Caras, PT, DPT Acute Rehabilitation Services  Pager [phone] Office [place] 06/29/2021, 8:15 AM

## 2021-06-29 NOTE — Progress Notes (Signed)
[facility] Progress Note   Subjective:  seen on HD no c/o  Objective Vitals:   06/29/21 1000 06/29/21 1030 06/29/21 1100 06/29/21 1130  BP: (!) 146/53 (!) 133/52 (!) 145/51 (!) 129/55  Pulse: (!) 56 (!) 55 (!) 58 61  Resp: 18 18 17 16   Temp:      TempSrc:      SpO2:      Weight:      Height:       Physical Exam General: Well appearing man, NAD Heart: RRR; no murmur Lungs: CTA anteriorly Abdomen: soft GU: Foley in place, suprapubic staples in place Extremities: R BKA,  UE edema / LE edema better today Dialysis Access: LUE AVF + thrill  Dialysis Orders: [facility]   4h 21min  450/800  108kg  2/2 bath  AVF  Hep none  - Mircera 157mcg IV q 2 wks (last 9/27) - Hectoral 47mcg IV q HD   Assessment/Plan:  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  Urine cx growing enterococcus faecalis. SP course of IV abx, may get longer course of po doxy for L foot.   ESRD: Continue usual HD TTS schedule. HD today.   HTN/volume: BP wnl, weights up, cont more aggressive UF today  Anemia: Hgb 9.6 - continue Aranesp q Tues   Metabolic bone disease: CorrCa/Phos ok, continue home binders  Nutrition:  Alb low, continue protein supplements.  T2DM:  per pmd  PAD: Arteriogram 10/11, s/p PTA to anterior tibial artery 10/4 with cardiology Dispo - was living at home, not sure going to SNF or home now   Kelly Splinter, MD 06/27/2021, 11:50 AM      Additional Objective Labs: Basic Metabolic Panel: Recent Labs  Lab 06/27/21 0131 06/28/21 0205 06/29/21 0233  NA 130* 134* 132*  K 4.5 4.0 4.0  CL 97* 97* 98  CO2 23 27 24   GLUCOSE 184* 198* 188*  BUN 41* 27* 40*  CREATININE 5.24* 4.04* 5.18*  CALCIUM 7.5* 7.8* 8.1*    Liver Function Tests: No results for input(s): AST, ALT, ALKPHOS, BILITOT, PROT, ALBUMIN in the last 168 hours.  CBC: Recent Labs  Lab 06/25/21 0532  06/26/21 0002 06/27/21 0131 06/28/21 0205 06/29/21 0233  WBC 9.0 8.8 9.4 6.9 7.8  HGB 9.6* 10.3* 9.5* 10.0* 9.5*  HCT 28.5* 31.7* 30.4* 30.6* 29.1*  MCV 93.1 93.5 97.1 94.4 95.4  PLT 184 184 164 144* 132*    Medications:  sodium chloride     sodium chloride      (feeding supplement) PROSource Plus  30 mL Oral BID BM   amLODipine  5 mg Oral Daily   apixaban  5 mg Oral BID   atorvastatin  40 mg Oral QHS   Chlorhexidine Gluconate Cloth  6 each Topical Q0600   clopidogrel  75 mg Oral Daily   collagenase   Topical Daily   darbepoetin (ARANESP) injection - DIALYSIS  100 mcg Intravenous Q Tue-HD   doxycycline  100 mg Oral Q12H   famotidine  20 mg Oral Daily   fentaNYL  1 patch Transdermal Q72H   insulin aspart  0-5 Units Subcutaneous QHS   insulin aspart  0-9 Units Subcutaneous TID WC   insulin aspart  5 Units Subcutaneous TID WC   insulin glargine-yfgn  7 Units Subcutaneous Daily   loratadine  10 mg Oral Daily   multivitamin  1 tablet Oral QHS  oxybutynin  5 mg Oral TID   pantoprazole  40 mg Oral Daily   pneumococcal 23 valent vaccine  0.5 mL Intramuscular Tomorrow-1000   pregabalin  75 mg Oral Daily   rOPINIRole  2 mg Oral QHS   sodium chloride flush  3 mL Intravenous Q12H   sodium chloride flush  3 mL Intravenous Q12H   tamsulosin  0.4 mg Oral Daily

## 2021-06-30 ENCOUNTER — Encounter: Payer: Self-pay | Admitting: Cardiology

## 2021-06-30 ENCOUNTER — Encounter (HOSPITAL_COMMUNITY): Payer: Self-pay

## 2021-06-30 ENCOUNTER — Other Ambulatory Visit (HOSPITAL_COMMUNITY): Payer: Self-pay

## 2021-06-30 LAB — GLUCOSE, CAPILLARY
Glucose-Capillary: 255 mg/dL — ABNORMAL HIGH (ref 70–99)
Glucose-Capillary: 245 mg/dL — ABNORMAL HIGH (ref 70–99)
Glucose-Capillary: 214 mg/dL — ABNORMAL HIGH (ref 70–99)

## 2021-06-30 MED ORDER — CLOPIDOGREL BISULFATE 75 MG PO TABS
75.0000 mg | ORAL_TABLET | Freq: Every day | ORAL | 0 refills | Status: AC
  Filled 2021-06-30: qty 30, 30d supply, fill #0

## 2021-06-30 MED ORDER — DOXYCYCLINE HYCLATE 100 MG PO TABS
100.0000 mg | ORAL_TABLET | Freq: Two times a day (BID) | ORAL | 0 refills | Status: AC
  Filled 2021-06-30: qty 18, 9d supply, fill #0

## 2021-06-30 MED ORDER — PREGABALIN 75 MG PO CAPS
75.0000 mg | ORAL_CAPSULE | Freq: Every day | ORAL | 0 refills | Status: DC
  Filled 2021-06-30: qty 14, 14d supply, fill #0

## 2021-06-30 MED ORDER — INSULIN GLARGINE 100 UNIT/ML ~~LOC~~ SOLN: 10.0000 [IU] | Freq: Every day | SUBCUTANEOUS | 0 refills | Status: DC

## 2021-06-30 NOTE — TOC Transition Note (Signed)
Transition of Care (TOC) - CM/SW Discharge Note [name] RN, BSN Transitions of Care Unit 4E- RN Case Manager See Treatment Team for direct phone #    Patient Details  Name: [name] MRN: [id number] Date of Birth: 1955/10/18  Transition of Care [facility]) CM/SW Contact:  Dawayne Patricia, RN Phone Number: 06/30/2021, 11:53 AM   Clinical Narrative:    Pt stable for transition home today, order placed for HHPT, pt has crutches in the room that have been already been delivered for him to take home.  Per renal navigator transportation has been set up for pt's transportation needs for HD tomorrow. CM spoke with pt at bedside and pt reports that he will have transportation home later today after 5pm per family.   CM also discussed transition needs with pt, pt reports he has needed DME at home, is agreeable to [place]- prefers services on non-HD days -Wed/Fri- list provided for choice Per CMS guidelines from medicare.gov website with star ratings (copy placed in shadow chart) - pt reports that he has had [facility] services in the past- call made to his wife to confirm agency- previous [facility] agency per wife was [facility]- they would like to see if they can services again- if not then they state they do not have any further preference and will defer to CM to secure services.   Address, phone # and PCP all confirmed with pt in epic.   Call made to [facility]- spoke with Bonnita Nasuti- per Bonnita Nasuti they are unable to accept pt back under services (hx of being inappropriate with their staff).  Call made to [facility]- spoke with [place]- they are agreeable to accept referral- have a delay for start of care until next week- which works with pt's HD schedule. - info faxed to [employer]- ([phone])   Final next level of care: [place] Barriers to Discharge: Barriers Resolved   Patient Goals and CMS Choice Patient states their goals for this hospitalization and ongoing  recovery are:: return home CMS Medicare.gov Compare Post Acute Care list provided to:: Patient Choice offered to / list presented to : Patient, Spouse  Discharge Placement                 Home w/ [facility]      Discharge Plan and Services In-house Referral: Clinical Social Work Discharge Planning Services: CM Consult Post Acute Care Choice: Home Health, Durable Medical Equipment          DME Arranged: Crutches DME Agency: [facility] Date DME Agency Contacted: 06/29/21 Time DME Agency Contacted:  (unit staff contacted) Representative spoke with at DME Agency: ortho tech [place]: PT [place]: [place] Date [place]: 06/30/21 Time [place]: 0071 Representative spoke with at [place]: [place] Determinants of Health ([place]) Interventions     Readmission Risk Interventions Readmission Risk Prevention Plan 06/30/2021  Transportation Screening Complete  Medication Review Press photographer) Complete  HRI or [place] Complete  SW Recovery Care/Counseling Consult Complete  [place] Not Applicable  Some recent data might be hidden

## 2021-06-30 NOTE — Progress Notes (Signed)
Spoke to [facility] with [employer] [phone]) this am regarding pt's transportation need to HD tomorrow. Barbara aware pt for d/c today and needs transport to HD tomorrow. Pamala Hurry to make arrangements for pt to have transport for tomorrow at regular time for regular out-pt HD treatment at [facility]. Spoke to [place], [employer] manager at [facility], to make her aware of pt's d/c today and to resume care tomorrow. Met with pt at bedside to advise him of the above arrangements. Pt voices understanding and aware how transportation works from prior to admission. Pt states his wife is out of town and he will need to locate transportation home at d/c. Update provided to attending and renal MD. [facility] staff aware of the above as well.   Melven Sartorius Renal Navigator [phone]

## 2021-06-30 NOTE — Progress Notes (Signed)
OT Cancellation Note  Patient Details Name: [name] MRN: [id number] DOB: December 19, 1955   Cancelled Treatment:    Reason Eval/Treat Not Completed: Patient declined, no reason specified pt declined any OOB attempts citing upset stomach. Pt requested OT to come back this evening around 5PM if possible.  Layla Maw 06/30/2021, 11:58 AM

## 2021-06-30 NOTE — Progress Notes (Signed)
[facility] Progress Note   Subjective:  seen in room. 4.5 L UF w/ HD yesterday. Down to 110 kg today  Objective Vitals:   06/29/21 1938 06/29/21 2358 06/30/21 0402 06/30/21 0824  BP: (!) 160/62 134/64 (!) 122/56 (!) 178/52  Pulse: 62 62 (!) 57 61  Resp: 18 18 17 19   Temp: 99 F (37.2 C) 98.6 F (37 C) 98.5 F (36.9 C) 98.5 F (36.9 C)  TempSrc: Oral Oral Oral Oral  SpO2: 92% 100% 96% 97%  Weight:   110.5 kg   Height:       Physical Exam General: Well appearing man, NAD Heart: RRR; no murmur Lungs: CTA anteriorly Abdomen: soft GU: Foley in place, suprapubic staples in place Extremities: R BKA,  UE edema / LE edema better today Dialysis Access: LUE AVF + thrill  Dialysis Orders: [facility]   4h 20min  450/800  108kg  2/2 bath  AVF  Hep none  - Mircera 151mcg IV q 2 wks (last 9/27) - Hectoral 67mcg IV q HD   Assessment/Plan:  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  Urine cx growing enterococcus faecalis. SP course of IV abx, may get longer course of po doxy for L foot.   ESRD: Continue usual HD TTS schedule. HD today.   HTN/volume: BP's wnl. Volume overload is better, only +2kg today  Anemia: Hgb 9.6 - continue Aranesp q Tues   Metabolic bone disease: CorrCa/Phos ok, continue home binders  Nutrition:  Alb low, continue protein supplements.  T2DM:  per pmd  PAD: Arteriogram 10/11, s/p PTA to anterior tibial artery 10/4 with cardiology Dispo - was living at home, was going to SNF but is doing better and is now is going home, probably today. Has transportation arranged for his OP HD on Sat tomorrow.    Kelly Splinter, MD 06/27/2021, 11:50 AM      Additional Objective Labs: Basic Metabolic Panel: Recent Labs  Lab 06/27/21 0131 06/28/21 0205 06/29/21 0233  NA 130* 134* 132*  K 4.5 4.0 4.0  CL 97* 97* 98  CO2 23 27 24   GLUCOSE 184* 198* 188*   BUN 41* 27* 40*  CREATININE 5.24* 4.04* 5.18*  CALCIUM 7.5* 7.8* 8.1*    Liver Function Tests: No results for input(s): AST, ALT, ALKPHOS, BILITOT, PROT, ALBUMIN in the last 168 hours.  CBC: Recent Labs  Lab 06/25/21 0532 06/26/21 0002 06/27/21 0131 06/28/21 0205 06/29/21 0233  WBC 9.0 8.8 9.4 6.9 7.8  HGB 9.6* 10.3* 9.5* 10.0* 9.5*  HCT 28.5* 31.7* 30.4* 30.6* 29.1*  MCV 93.1 93.5 97.1 94.4 95.4  PLT 184 184 164 144* 132*    Medications:  sodium chloride     sodium chloride      (feeding supplement) PROSource Plus  30 mL Oral BID BM   amLODipine  5 mg Oral Daily   apixaban  5 mg Oral BID   atorvastatin  40 mg Oral QHS   Chlorhexidine Gluconate Cloth  6 each Topical Q0600   clopidogrel  75 mg Oral Daily   collagenase   Topical Daily   darbepoetin (ARANESP) injection - DIALYSIS  100 mcg Intravenous Q Tue-HD   doxycycline  100 mg Oral Q12H   famotidine  20 mg Oral Daily   fentaNYL  1 patch Transdermal Q72H   insulin aspart  0-5 Units Subcutaneous QHS   insulin aspart  0-9 Units  Subcutaneous TID WC   insulin aspart  5 Units Subcutaneous TID WC   insulin glargine-yfgn  7 Units Subcutaneous Daily   loratadine  10 mg Oral Daily   multivitamin  1 tablet Oral QHS   oxybutynin  5 mg Oral TID   pantoprazole  40 mg Oral Daily   pneumococcal 23 valent vaccine  0.5 mL Intramuscular Tomorrow-1000   pregabalin  75 mg Oral Daily   rOPINIRole  2 mg Oral QHS   sodium chloride flush  3 mL Intravenous Q12H   sodium chloride flush  3 mL Intravenous Q12H   tamsulosin  0.4 mg Oral Daily

## 2021-06-30 NOTE — Discharge Summary (Signed)
Physician Discharge Summary  [name] [name]:[id number] DOB: 06-Jun-1956 DOA: 06/13/2021  PCP: [name], PA-C  Admit date: 06/13/2021 Discharge date: 06/30/2021  Admitted From: home Disposition:  home  Recommendations for Outpatient Follow-up:  Follow up with PCP in 1-2 weeks Please obtain BMP/CBC in one week  Home Health:yes  Equipment/Devices: yes  Discharge Condition: Stable Code Status:   Code Status: Full Code Diet recommendation:  Diet Order             Diet renal/carb modified with fluid restriction Diet-HS Snack? Nothing; Fluid restriction: 1200 mL Fluid; Room service appropriate? Yes; Fluid consistency: Thin  Diet effective now                    Brief/Interim Summary: 65 y.o. 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, Dr. Clyda Greener seen on 10/3 by [name] potential need for transtibial amputation and referred to [name] for PVD/angiogram PAF on Eliquis, ESRD on HD TTS, chronic diastolic CHF, M7EM, HTN, dyslipidemia sent from dialysis center for further evaluation of the wound due to increasing swelling, pain around the penis and the scrotum and had purulent discharge from the urethra and purulent secretion from penile prosthesis. Seen in the ED-found to have sepsis with leukocytosis lactic acidosis.  Urology consulted underwent removal of 3 piece inflatable penile prosthesis 10/5-and patient admitted on IV antibiotics.S/P removal of 3 piece inflatable penile prosthesis by Dr [name] 10/5.  JP drain has been removed.  Aerobic/anaerobic culture from 10/5 shows Enterococcus faecalis sensitive to ampicillin-urine culture 10/5 with same organism-patient has completed antibiotics from urology standpoint. He has left foot osteomyelitis, seen by [name] managed w/ antibiotics Left foot wound has significantly improved, changed to doxycycline x10 days.  At  this time patient is waiting for placement to skilled nursing facility-he is refusing SNF placement and wants to go home.   Discharge Diagnoses:  Severe sepsis due to infected penile prosthesis:S/P removal of 3 piece inflatable penile prosthesis by Dr [name] 10/5.  JP drain has been removed.Aerobic/anaerobic culture from 10/5 shows Enterococcus faecalis sensitive to ampicillin-urine culture 10/5 with same organism.  Blood culture no growth to date.  Repeat urine culture 10/12 no growth.Patient completed antibiotics for urine infection.  Foley catheter discontinued 10/19-but reinserted as he failed TOV-he will need outpatient follow-up with urology to remove the staples and the Foley catheter. Recent Labs  Lab 06/25/21 0532 06/26/21 0002 06/27/21 0131 06/28/21 0205 06/29/21 0233  WBC 9.0 8.8 9.4 6.9 7.8  Left foot infection-abnormal signal abnormalities of cuboid worrisome for osteomyelitis, cellulitis and myositis/PVD: Seen by [name], cardiology-underwent angiogram 10/11 no significant iliofemoral disease patent left SFA stents, 2/3 vessel below the knee runoff to the foot, proximal occluded left AT.Left foot no palpable DP pulse.S/P left AT revascularization 10/14 a.m. by Dr. Patwardhan-cardiology arranging for outpatient duplex ultrasound and outpatient follow-up. CRP normal, sed rate 17. Discussed with [name] 10/19- advised to continue on doxycycline x10 days and OP f/u with him in 2 wks  CAD s/p PCI:f/b [name].  Currently no chest pain. Cont home meds-Lipitor Plavix.  IDDM with uncontrolled hyperglycemia-HbA1c 7.9.Had hypoglycemia 10/16-blood sugar fairly stable on insulin adjusted her can slowly increase to home dose based upon how his blood sugar does at home.   Recent Labs  Lab 06/29/21 0604 06/29/21 1228 06/29/21 1553 06/29/21 2124 06/30/21 7544  GLUCAP  168* 156* 204* 216* 255*  CHE:NIDP controlled.Coreg discontinued due to bradycardia.  Back on Eliquis 1-/19 from heparin gtt.    Chronic pain:Continue fentanyl patch pain management.  Sinus bradycardia overnight in 40s to 50s 10/16-17 night-Coreg discontinued.HR-stable now  ESRD on HD TTS: Managed by nephrology, had HD 10/18.  Hypertension/volume: BP is stable.  Coreg discontinued due to bradycardia 10/17-continue amlodipine,Cont volume management in HD.   Metabolic bone disease: Calcium/phosphorus stable- continue home binders Anemia of chronic renal disease: On Aranesp q. Tuesday.  Hemoglobin is stable.  Monitor.  Recent Labs  Lab 06/25/21 0532 06/26/21 0002 06/27/21 0131 06/28/21 0205 06/29/21 0233  HGB 9.6* 10.3* 9.5* 10.0* 9.5*  HCT 28.5* 31.7* 30.4* 30.6* 29.1*    Class 1 Obesity:Patient's Body mass index is 31.28 kg/m. : Will benefit with PCP follow-up, weight loss  healthy lifestyle and outpatient sleep evaluation.  Goals of care palliative care on board remains full code.  Deconditioning/debility:will need a skilled nursing facility and is medically stable for discharge once bed available  Consults: Orthopedics Nephrology  Subjective:  alert awake oriented resting comfortably.  Foley catheter in place.  Surgical surgical staple intact..  Discharge Exam: Vitals:   06/30/21 0402 06/30/21 0824  BP: (!) 122/56 (!) 178/52  Pulse: (!) 57 61  Resp: 17 19  Temp: 98.5 F (36.9 C) 98.5 F (36.9 C)  SpO2: 96% 97%   General: Pt is alert, awake, not in acute distress Cardiovascular: RRR, S1/S2 +, no rubs, no gallops Respiratory: CTA bilaterally, no wheezing, no rhonchi Abdominal: Soft, NT, ND, bowel sounds +, Surgical surgical staple intact Extremities: no edema, no cyanosis.  RLE prosthesis, left foot on chronic.  Discharge Instructions  Discharge Instructions     Discharge instructions   Complete by: As directed    Follow-up with [name] in 2 weeks  Follow-up with [name] in 1-2 wk from cardiology  Please call call MD or return to ER for similar or worsening recurring problem  that brought you to hospital or if any fever,nausea/vomiting,abdominal pain, uncontrolled pain, chest pain,  shortness of breath or any other alarming symptoms.  Please follow-up your doctor as instructed in a week time and call the office for appointment.  Please avoid alcohol, smoking, or any other illicit substance and maintain healthy habits including taking your regular medications as prescribed.  You were cared for by a hospitalist during your hospital stay. If you have any questions about your discharge medications or the care you received while you were in the hospital after you are discharged, you can call the unit and ask to speak with the hospitalist on call if the hospitalist that took care of you is not available.  Once you are discharged, your primary care physician will handle any further medical issues. Please note that NO REFILLS for any discharge medications will be authorized once you are discharged, as it is imperative that you return to your primary care physician (or establish a relationship with a primary care physician if you do not have one) for your aftercare needs so that they can reassess your need for medications and monitor your lab values   Discharge wound care:   Complete by: As directed    Cleanse wound left buttocks with NS and pat dry.  Apply Santyl to necrotic tissue. Cover with NS moist gauze.  Secure with foam dressing.  Peel back foam and replace santyl and gauze daily. Replace foam every three days. Please date foam dressing   Face-to-face  encounter (required for Medicare/Medicaid patients)   Complete by: As directed    I Antonieta Pert certify that this patient is under my care and that I, or a nurse practitioner or physician's assistant working with me, had a face-to-face encounter that meets the physician face-to-face encounter requirements with this patient on 06/30/2021. The encounter with the patient was in whole, or in part for the following medical  condition(s) which is the primary reason for home health care (List medical condition): Deconditioning, foot infection   The encounter with the patient was in whole, or in part, for the following medical condition, which is the primary reason for home health care: Deconditioning   I certify that, based on my findings, the following services are medically necessary home health services: Physical therapy   Reason for Medically Necessary Home Health Services: Therapy- Therapeutic Exercises to Increase Strength and Endurance   My clinical findings support the need for the above services: Pain interferes with ambulation/mobility   Further, I certify that my clinical findings support that this patient is homebound due to: Pain interferes with ambulation/mobility   Home Health   Complete by: As directed    To provide the following care/treatments: PT   Increase activity slowly   Complete by: As directed       Allergies as of 06/30/2021       Reactions   Contrast Media [iodinated Diagnostic Agents]    Difficulty breathing   Ivp Dye [iodinated Diagnostic Agents] Anaphylaxis, Other (See Comments)   Breathing problems, altered mental state   Adhesive [tape] Rash   Rash after 3-4 days use        Medication List     STOP taking these medications    BiDil 20-37.5 MG tablet Generic drug: isosorbide-hydrALAZINE   carvedilol 25 MG tablet Commonly known as: COREG   sulfamethoxazole-trimethoprim 800-160 MG tablet Commonly known as: BACTRIM DS   torsemide 20 MG tablet Commonly known as: DEMADEX       TAKE these medications    amLODipine 5 MG tablet Commonly known as: NORVASC Take 5 mg by mouth daily.   apixaban 5 MG Tabs tablet Commonly known as: ELIQUIS Take 5 mg by mouth See admin instructions. Only take on non-dialysis days Monday,Wednesday,Friday,sunday   atorvastatin 40 MG tablet Commonly known as: LIPITOR Take 40 mg by mouth at bedtime.   clopidogrel 75 MG  tablet Commonly known as: PLAVIX Take 1 tablet (75 mg total) by mouth daily.   doxycycline 100 MG tablet Commonly known as: VIBRA-TABS Take 1 tablet (100 mg total) by mouth every 12 (twelve) hours for 9 days.   famotidine 40 MG tablet Commonly known as: PEPCID Take 40 mg by mouth daily.   fentaNYL 50 MCG/HR Commonly known as: DURAGESIC Place 50 mcg onto the skin every 3 (three) days.   folic acid-vitamin b complex-vitamin c-selenium-zinc 3 MG Tabs tablet Take 1 tablet by mouth daily.   GLUCOSAMINE PO Take 100 tablets by mouth 2 (two) times daily.   HumaLOG KwikPen 100 UNIT/ML KwikPen Generic drug: insulin lispro Inject 13 Units into the skin 3 (three) times daily.   insulin glargine 100 UNIT/ML injection Commonly known as: LANTUS Inject 0.1 mLs (10 Units total) into the skin at bedtime. What changed: how much to take   ketoconazole 2 % cream Commonly known as: NIZORAL Apply 1 application topically 2 (two) times daily as needed for irritation.   lidocaine-prilocaine cream Commonly known as: EMLA Dialysis days Tuesday,Thursday and saturday   linaclotide  145 MCG Caps capsule Commonly known as: LINZESS Take 145 mcg by mouth daily as needed (constipation).   loratadine 10 MG tablet Commonly known as: CLARITIN Take 10 mg by mouth daily.   NEEDLE (REUSABLE) 22 G 22G X 1-1/2" Misc 1 Units by Does not apply route as directed. For B12 IM inj   nitroGLYCERIN 0.2 mg/hr patch Commonly known as: NITRODUR - Dosed in mg/24 hr Place 1 patch (0.2 mg total) onto the skin daily.   omega-3 acid ethyl esters 1 g capsule Commonly known as: LOVAZA Take 2 g by mouth 2 (two) times daily.   ondansetron 4 MG tablet Commonly known as: ZOFRAN Take 4 mg by mouth every 6 (six) hours as needed for nausea or vomiting.   oxyCODONE-acetaminophen 7.5-325 MG tablet Commonly known as: PERCOCET Take 1 tablet by mouth 3 (three) times daily as needed for moderate pain.   pantoprazole 40 MG  tablet Commonly known as: PROTONIX Take 40 mg by mouth daily.   Polyvinyl Alcohol-Povidone PF 1.4-0.6 % Soln Place 1 drop into both eyes 2 (two) times daily as needed (dry eyes).   pregabalin 75 MG capsule Commonly known as: LYRICA Take 1 capsule (75 mg total) by mouth daily for 14 days. What changed:  medication strength how much to take when to take this   rOPINIRole 2 MG tablet Commonly known as: REQUIP Take 2 mg by mouth at bedtime.   SYRINGE 3CC/22GX1-1/2" 22G X 1-1/2" 3 ML Misc 1 Syringe by Does not apply route as directed. For b12 IM inj   tamsulosin 0.4 MG Caps capsule Commonly known as: FLOMAX Take 0.4 mg by mouth daily.               Durable Medical Equipment  (From admission, onward)           Start     Ordered   06/29/21 0817  For home use only DME Crutches  Once        06/29/21 0816              Discharge Care Instructions  (From admission, onward)           Start     Ordered   06/30/21 0000  Discharge wound care:       Comments: Cleanse wound left buttocks with NS and pat dry.  Apply Santyl to necrotic tissue. Cover with NS moist gauze.  Secure with foam dressing.  Peel back foam and replace santyl and gauze daily. Replace foam every three days. Please date foam dressing   06/30/21 1050            Follow-up Information     [facility] Follow up.   Why: The office will call to arrange f/u next week for staple removal.  If you haven't heard from them by Monday, please call. Contact information: [place]        Newt Minion, MD Follow up in 2 week(s).   Specialty: Orthopedic Surgery Contact information: [place] [address] [phone]         [name], PA-C Follow up in 1 week(s).   Specialty: Physician Assistant Contact information: [address] [place] [address] [phone]                 Allergies  Allergen Reactions   Contrast Media [Iodinated Diagnostic Agents]     Difficulty breathing  Ivp Dye [Iodinated Diagnostic Agents] Anaphylaxis and Other (See Comments)    Breathing problems, altered mental state    Adhesive [Tape] Rash    Rash after 3-4 days use    The results of significant diagnostics from this hospitalization (including imaging, microbiology, ancillary and laboratory) are listed below for reference.    Microbiology: Recent Results (from the past 240 hour(s))  Urine Culture     Status: None   Collection Time: 06/21/21  2:16 PM   Specimen: Urine, Catheterized  Result Value Ref Range Status   Specimen Description URINE, CATHETERIZED  Final   Special Requests NONE  Final   Culture   Final    NO GROWTH Performed at [facility], [address], [place], [place] [address]    Report Status 06/22/2021 FINAL  Final    Procedures/Studies: CT ABDOMEN PELVIS WO CONTRAST  Result Date: 06/13/2021 CLINICAL DATA:  Inflammatory change in the perineum and scrotum with elevated white blood cell count concerning for possible Fournier's gangrene. Patient states a sensation of the penile prosthesis abutting the tip of the penis. EXAM: CT ABDOMEN AND PELVIS WITHOUT CONTRAST TECHNIQUE: Multidetector CT imaging of the abdomen and pelvis was performed following the standard protocol without IV contrast. COMPARISON:  06/19/2019 FINDINGS: Lower chest: Lung bases demonstrate minimal atelectatic changes on the left. No focal effusion or infiltrate is seen. Hepatobiliary: No focal liver abnormality is seen. Status post cholecystectomy. No biliary dilatation. Pancreas: Unremarkable. No pancreatic ductal dilatation or surrounding inflammatory changes. Spleen: Normal in size without focal abnormality. Adrenals/Urinary Tract: Adrenal glands are within normal limits. Stomach/Bowel: Kidneys are well visualized bilaterally without renal calculi or obstructive changes. The bladder  is partially distended. Vascular/Lymphatic: No obstructive or inflammatory changes of the colon are seen. The appendix is within normal limits. Postsurgical changes in the sigmoid are noted. Small bowel and stomach are unremarkable. Reproductive: Prostate appears within normal limits. There are findings of a penile prosthesis identified. The reservoir in the pelvis appears within normal limits although there is significant Pericatheter inflammatory change surrounding the catheter extending to the pump. The penile portion of the prosthesis extends to the penis tip although no erosive changes are identified. Some inflammatory change is noted surrounding the squeeze pump portion of the prosthesis and extending into the scrotum although no definitive fluid collection is seen. Some calcifications associated with the testicles are noted likely chronic in nature. Other: No free fluid is noted.  No hernia is seen. Musculoskeletal: Postsurgical changes in the lower lumbar spine are noted. Degenerative changes are seen as well. No acute bony abnormality is noted. IMPRESSION: Changes in the scrotum and prepubic soft tissues consistent with localized infection surrounding the catheter and squeeze pump of the penile prosthesis. No definitive abscess is seen at this time. The penile component of the prosthesis does not appear to erode through the skin. No other focal abnormality is noted. Critical Value/emergent results were called by telephone at the time of interpretation on 06/13/2021 at 9:30 pm to Dr. Marda Stalker , who verbally acknowledged these results. Electronically Signed   By: Inez Catalina M.D.   On: 06/13/2021 21:39   MR FOOT LEFT WO CONTRAST  Result Date: 06/16/2021 CLINICAL DATA:  Chronic diabetic foot wound. EXAM: MRI OF THE LEFT FOOT WITHOUT CONTRAST TECHNIQUE: Multiplanar, multisequence MR imaging of the left foot was performed. No intravenous contrast was administered. COMPARISON:  Radiographs  06/13/2021 FINDINGS: There is a lateral and plantar foot wound extending right down close to the  cuboid and base of the fifth metatarsal. Small locules of gas are suspected. I do not see a discrete fluid collection to suggest a drainable abscess but there are changes of cellulitis involving the foot and ankle. Myofasciitis mainly involving the short flexor muscles of the foot without definite findings for pyomyositis. Abnormal T2 signal intensity in the cuboid is worrisome for osteomyelitis. I do not see any other sites suspicious for osteomyelitis or septic arthritis. Moderate degenerative changes throughout the foot. Markedly thickened peroneal tendons possibly suggesting chronic tendinopathy. Hindfoot varus noted. IMPRESSION: 1. Lateral and plantar foot wound extending down close to the cuboid and base of the fifth metatarsal. Small locules of gas are suspected. 2. Abnormal T2 signal intensity in the cuboid worrisome for osteomyelitis. 3. Cellulitis and myofasciitis without focal drainable soft tissue abscess or pyomyositis. Electronically Signed   By: Marijo Sanes M.D.   On: 06/16/2021 08:22   PERIPHERAL VASCULAR CATHETERIZATION  Result Date: 06/27/2021 Images from the original result were not included. Proximally occluded left anterior tibial artery Successful revascularization with PTA 3.0 x 220, 2.0 x 220, 1.5 x 40 mm over-the-wire balloons 0% residual stenosis, but sluggish flow into pedal plantar arch Nigel Mormon, MD Pager: [phone] Office: [phone]  PERIPHERAL VASCULAR CATHETERIZATION  Result Date: 06/20/2021 Images from the original result were not included. No significant Ileofemoral disease Patent Lt SFA stents 2/3 vessel below the knee runoff to the foot Proximally occluded Lt AT Nigel Mormon, MD Pager: [phone] Office: [phone]  DG Chest Port 1 View  Result Date: 06/13/2021 CLINICAL DATA:  Wound infection. EXAM: PORTABLE CHEST 1 VIEW COMPARISON:  Chest  x-ray dated November 18, 2020. FINDINGS: The heart size and mediastinal contours are within normal limits. Both lungs are clear. The visualized skeletal structures are unremarkable. IMPRESSION: No active disease. Electronically Signed   By: Titus Dubin M.D.   On: 06/13/2021 20:42   DG Foot 2 Views Left  Result Date: 06/13/2021 CLINICAL DATA:  Wound infection EXAM: LEFT FOOT - 2 VIEW COMPARISON:  10/11/2016 FINDINGS: Bones appear demineralized. There are vascular calcifications. Patient is status post amputation of the fifth digit and partial amputation fourth digit at the level of the MTP joint. Presumed wound lateral aspect of the distal foot. Possible osteopenia and cortical erosive change at the head of the fourth metatarsal. IMPRESSION: Osteopenia with questionable erosive change/osteomyelitis at the head of the fourth metatarsal. Consider correlation with MRI. Electronically Signed   By: Donavan Foil M.D.   On: 06/13/2021 20:43   VAS [place] ABI WITH/WO TBI  Result Date: 06/19/2021  LOWER EXTREMITY DOPPLER STUDY Patient Name:  KUTLER VANVRANKEN  Date of Exam:   06/18/2021 Medical Rec #: [id number]      Accession #:    [id number] Date of Birth: 10-26-1955      Patient Gender: M Patient Age:   65 years Exam Location:  [facility] Procedure:      VAS [place] ABI WITH/WO TBI Referring Phys: Adrian Prows --------------------------------------------------------------------------------  Indications: Ulceration, and peripheral artery disease. High Risk Factors: Hyperlipidemia, Diabetes, past history of smoking. Other Factors: ESRD, on dialysis, atrial fibrillation.  Vascular Interventions: Right BKA, Left CEA,. Comparison Study: Prior limited ABI done 08/19/20 Performing Technologist: Sharion Dove RVS  Examination Guidelines: A complete evaluation includes at minimum, Doppler waveform signals and systolic blood pressure reading at the level of bilateral brachial, anterior tibial, and posterior tibial arteries, when  vessel segments are accessible. Bilateral testing is considered an integral part of  a complete examination. Photoelectric Plethysmograph (PPG) waveforms and toe systolic pressure readings are included as required and additional duplex testing as needed. Limited examinations for reoccurring indications may be performed as noted.  ABI Findings: +---------+------------------+-----+--------+--------+ Right    Rt Pressure (mmHg)IndexWaveformComment  +---------+------------------+-----+--------+--------+ Brachial 136                    biphasic         +---------+------------------+-----+--------+--------+ PTA                                     BKA      +---------+------------------+-----+--------+--------+ DP                                      BKA      +---------+------------------+-----+--------+--------+ Great Toe                               BKA      +---------+------------------+-----+--------+--------+ +---------+------------------+-----+--------+--------------------------+ Left     Lt Pressure (mmHg)IndexWaveformComment                    +---------+------------------+-----+--------+--------------------------+ Brachial                                Restricted/dialysis access +---------+------------------+-----+--------+--------------------------+ PTA      254               1.87                                    +---------+------------------+-----+--------+--------------------------+ DP       86                0.63                                    +---------+------------------+-----+--------+--------------------------+ Great Toe79                0.58                                    +---------+------------------+-----+--------+--------------------------+ +-------+---------------------+-----------+------------+------------+ ABI/TBIToday's ABI          Today's TBIPrevious ABIPrevious TBI  +-------+---------------------+-----------+------------+------------+ Right  BKA                  BKA                                 +-------+---------------------+-----------+------------+------------+ Left   non compressible/0.630.58                   0.58         +-------+---------------------+-----------+------------+------------+ Left TBIs appear essentially unchanged compared to prior study on 08/19/20.  Summary: Right: BKA. Left: Resting left ankle-brachial index indicates noncompressible left lower extremity arteries. The left toe-brachial index is abnormal.  *See table(s) above for measurements and observations.  Electronically signed by Orlie Pollen on 06/19/2021 at 6:49:05 AM.    Final  ECHOCARDIOGRAM COMPLETE  Result Date: 06/19/2021    ECHOCARDIOGRAM REPORT   Patient Name:   CHUCK CABAN Date of Exam: 06/17/2021 Medical Rec #:  [id number]     Height:       74.0 in Accession #:    [id number]    Weight:       252.4 lb Date of Birth:  April 29, 1956     BSA:          2.400 m Patient Age:    21 years      BP:           136/61 mmHg Patient Gender: M             HR:           75 bpm. Exam Location:  Inpatient Procedure: 2D Echo, Cardiac Doppler and Color Doppler Indications:     I48.91* Unspeicified atrial fibrillation  History:         Patient has prior history of Echocardiogram examinations, most                  recent 09/17/2018. COPD; Risk Factors:Hypertension, Diabetes and                  Dyslipidemia.  Sonographer:     Bernadene Person RDCS Referring Phys:  [place] Diagnosing Phys: Adrian Prows MD IMPRESSIONS  1. Left ventricular ejection fraction, by estimation, is 55 to 60%. The left ventricle has normal function. The left ventricle has no regional wall motion abnormalities. There is mild concentric left ventricular hypertrophy. Left ventricular diastolic parameters are indeterminate.  2. Right ventricular systolic function is normal. The right ventricular size is normal.  3. Left  atrial size was moderately dilated.  4. The mitral valve is normal in structure. No evidence of mitral valve regurgitation. Moderate mitral annular calcification.  5. The aortic valve is tricuspid. Aortic valve regurgitation is not visualized. Mild to moderate aortic valve sclerosis/calcification is present, without any evidence of aortic stenosis. FINDINGS  Left Ventricle: Diastolic function is indeterminate due to moderate to severe MAC. Left ventricular ejection fraction, by estimation, is 55 to 60%. The left ventricle has normal function. The left ventricle has no regional wall motion abnormalities. The  left ventricular internal cavity size was normal in size. There is mild concentric left ventricular hypertrophy. Left ventricular diastolic parameters are indeterminate. Right Ventricle: The right ventricular size is normal. No increase in right ventricular wall thickness. Right ventricular systolic function is normal. Left Atrium: Left atrial size was moderately dilated. Right Atrium: Right atrial size was normal in size. Pericardium: There is no evidence of pericardial effusion. Mitral Valve: The mitral valve is normal in structure. There is mild calcification of the posterior mitral valve leaflet(s). Moderate mitral annular calcification. No evidence of mitral valve regurgitation. Tricuspid Valve: The tricuspid valve is normal in structure. Tricuspid valve regurgitation is trivial. No evidence of tricuspid stenosis. Aortic Valve: The aortic valve is tricuspid. Aortic valve regurgitation is not visualized. Mild to moderate aortic valve sclerosis/calcification is present, without any evidence of aortic stenosis. Pulmonic Valve: The pulmonic valve was normal in structure. Pulmonic valve regurgitation is not visualized. No evidence of pulmonic stenosis. Aorta: The aortic root is normal in size and structure. IAS/Shunts: No atrial level shunt detected by color flow Doppler.  LEFT VENTRICLE PLAX 2D LVIDd:          5.30 cm LVIDs:         3.30 cm LV PW:  1.00 cm LV IVS:        1.20 cm LVOT diam:     2.20 cm LV SV:         65 LV SV Index:   27 LVOT Area:     3.80 cm  RIGHT VENTRICLE TAPSE (M-mode): 1.7 cm LEFT ATRIUM             Index        RIGHT ATRIUM           Index LA diam:        4.70 cm 1.96 cm/m   RA Area:     16.70 cm LA Vol (A2C):   64.3 ml 26.80 ml/m  RA Volume:   42.20 ml  17.59 ml/m LA Vol (A4C):   66.3 ml 27.63 ml/m LA Biplane Vol: 67.3 ml 28.05 ml/m  AORTIC VALVE LVOT Vmax:   85.60 cm/s LVOT Vmean:  57.900 cm/s LVOT VTI:    0.170 m  AORTA Ao Root diam: 3.90 cm Ao Asc diam:  3.80 cm  SHUNTS Systemic VTI:  0.17 m Systemic Diam: 2.20 cm Adrian Prows MD Electronically signed by Adrian Prows MD Signature Date/Time: 06/19/2021/6:32:38 PM    Final    VAS [place] LOWER EXTREMITY ARTERIAL DUPLEX  Result Date: 06/22/2021 LOWER EXTREMITY ARTERIAL DUPLEX STUDY Patient Name:  JOSEMANUEL EAKINS  Date of Exam:   06/22/2021 Medical Rec #: [id number]      Accession #:    [id number] Date of Birth: 24-Oct-1955      Patient Gender: M Patient Age:   50 years Exam Location:  [facility] Procedure:      VAS [place] LOWER EXTREMITY ARTERIAL DUPLEX Referring Phys: [facility] PATWARDHAN --------------------------------------------------------------------------------  Indications: Ulceration, and peripheral artery disease. High Risk Factors: Hyperlipidemia, Diabetes, current smoker.  Vascular Interventions: Right BKA, Left CEA. Current ABI:            R:BKA L:non compressible toe:0.58 Comparison Study: No prior LEA Performing Technologist: Sharion Dove RVS  Examination Guidelines: A complete evaluation includes B-mode imaging, spectral Doppler, color Doppler, and power Doppler as needed of all accessible portions of each vessel. Bilateral testing is considered an integral part of a complete examination. Limited examinations for reoccurring indications may be performed as noted.   +----------+--------+-----+--------+-------------------+--------+ LEFT      PSV cm/sRatioStenosisWaveform           Comments +----------+--------+-----+--------+-------------------+--------+ CFA Prox  130                  multiphasic                 +----------+--------+-----+--------+-------------------+--------+ DFA       68                   multiphasic                 +----------+--------+-----+--------+-------------------+--------+ SFA Prox  101                  multiphasic                 +----------+--------+-----+--------+-------------------+--------+ SFA Mid   122                  multiphasic                 +----------+--------+-----+--------+-------------------+--------+ SFA [id number]                  multiphasic                 +----------+--------+-----+--------+-------------------+--------+  POP Prox  79                   monophasic                  +----------+--------+-----+--------+-------------------+--------+ POP Distal76                   monophasic                  +----------+--------+-----+--------+-------------------+--------+ ATA Prox  69                   monophasic                  +----------+--------+-----+--------+-------------------+--------+ ATA Mid   26                   monophasic                  +----------+--------+-----+--------+-------------------+--------+ ATA Distal15                   dampened monophasic         +----------+--------+-----+--------+-------------------+--------+ PTA Prox  79                   multiphasic                 +----------+--------+-----+--------+-------------------+--------+ PTA Mid   116                  multiphasic                 +----------+--------+-----+--------+-------------------+--------+ PTA Distal101                  multiphasic                 +----------+--------+-----+--------+-------------------+--------+   Summary: Right: Multiphasic  waveforms noted in the CFA, FA, PFA, and PTA. Monophasic flow noted in the popliteal artery indicative of a more proximal stenosis. Monophasic flow in the anterior tibial artery.  See table(s) above for measurements and observations. Electronically signed by Orlie Pollen on 06/22/2021 at 3:14:37 PM.    Final     Labs: BNP (last 3 results) No results for input(s): BNP in the last 8760 hours. Basic Metabolic Panel: Recent Labs  Lab 06/25/21 0532 06/26/21 0002 06/27/21 0131 06/28/21 0205 06/29/21 0233  NA 132* 132* 130* 134* 132*  K 3.9 4.8 4.5 4.0 4.0  CL 96* 97* 97* 97* 98  CO2 _0 GLUCOSE 278* 232* 184* 198* 188*  BUN 22 27* 41* 27* 40*  CREATININE 3.60* 4.08* 5.24* 4.04* 5.18*  CALCIUM 7.6* 7.8* 7.5* 7.8* 8.1*   Liver Function Tests: No results for input(s): AST, ALT, ALKPHOS, BILITOT, PROT, ALBUMIN in the last 168 hours. No results for input(s): LIPASE, AMYLASE in the last 168 hours. No results for input(s): AMMONIA in the last 168 hours. CBC: Recent Labs  Lab 06/25/21 0532 06/26/21 0002 06/27/21 0131 06/28/21 0205 06/29/21 0233  WBC 9.0 8.8 9.4 6.9 7.8  HGB 9.6* 10.3* 9.5* 10.0* 9.5*  HCT 28.5* 31.7* 30.4* 30.6* 29.1*  MCV 93.1 93.5 97.1 94.4 95.4  PLT 184 184 164 144* 132*   Cardiac Enzymes: No results for input(s): CKTOTAL, CKMB, CKMBINDEX, TROPONINI in the last 168 hours. BNP: Invalid input(s): POCBNP CBG: Recent Labs  Lab 06/29/21 0604 06/29/21 1228 06/29/21 1553 06/29/21 2124 06/30/21 0623  GLUCAP 168* 156* 204* 216* 255*   D-Dimer No results for  input(s): DDIMER in the last 72 hours. Hgb A1c No results for input(s): HGBA1C in the last 72 hours. Lipid Profile No results for input(s): CHOL, HDL, LDLCALC, TRIG, CHOLHDL, LDLDIRECT in the last 72 hours. Thyroid function studies No results for input(s): TSH, T4TOTAL, T3FREE, THYROIDAB in the last 72 hours.  Invalid input(s): FREET3 Anemia work up No results for input(s): VITAMINB12,  FOLATE, FERRITIN, TIBC, IRON, RETICCTPCT in the last 72 hours. Urinalysis    Component Value Date/Time   COLORURINE YELLOW 06/13/2021 1220   APPEARANCEUR CLOUDY (A) 06/13/2021 1220   LABSPEC 1.013 06/13/2021 1220   PHURINE 7.0 06/13/2021 1220   GLUCOSEU >=500 (A) 06/13/2021 1220   HGBUR MODERATE (A) 06/13/2021 1220   BILIRUBINUR NEGATIVE 06/13/2021 1220   KETONESUR 5 (A) 06/13/2021 1220   PROTEINUR 100 (A) 06/13/2021 1220   UROBILINOGEN 0.2 01/14/2015 0346   NITRITE NEGATIVE 06/13/2021 1220   LEUKOCYTESUR LARGE (A) 06/13/2021 1220   Sepsis Labs Invalid input(s): PROCALCITONIN,  WBC,  LACTICIDVEN Microbiology Recent Results (from the past 240 hour(s))  Urine Culture     Status: None   Collection Time: 06/21/21  2:16 PM   Specimen: Urine, Catheterized  Result Value Ref Range Status   Specimen Description URINE, CATHETERIZED  Final   Special Requests NONE  Final   Culture   Final    NO GROWTH Performed at [facility], [address] [place], [place] [address]    Report Status 06/22/2021 FINAL  Final     Time coordinating discharge: 25 minutes  SIGNED: Antonieta Pert, MD  [facility] 06/30/2021, 10:52 AM  If 7PM-7AM, please contact night-coverage [email or web address]

## 2021-07-01 ENCOUNTER — Other Ambulatory Visit: Payer: Self-pay | Admitting: Cardiology

## 2021-07-01 DIAGNOSIS — I739 Peripheral vascular disease, unspecified: Secondary | ICD-10-CM

## 2021-07-03 ENCOUNTER — Telehealth (HOSPITAL_COMMUNITY): Payer: Self-pay | Admitting: Pharmacist

## 2021-07-03 NOTE — Telephone Encounter (Signed)
LVM

## 2021-07-03 NOTE — TOC Transition Note (Signed)
Transition of care contact from inpatient facility  Date of discharge: 06/30/21 Date of contact: 07/02/21 Method: Phone Spoke to: Patient  Patient contacted to discuss transition of care from recent inpatient hospitalization. 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   Medication changes were reviewed.  Patient will follow up with his outpatient HD unit on: Tuesday 07/04/21 at [facility].  Tobie Poet, NP

## 2021-07-04 ENCOUNTER — Telehealth (HOSPITAL_COMMUNITY): Payer: Self-pay | Admitting: Pharmacist

## 2021-07-04 ENCOUNTER — Telehealth: Payer: Self-pay | Admitting: Student

## 2021-07-04 NOTE — Telephone Encounter (Signed)
Spoke with patient's wife [name], and have scheduled a Telehealth Zoom Palliative f/u visit with patient for 07/05/21 @ 4 PM.

## 2021-07-04 NOTE — Telephone Encounter (Signed)
2nd attempt

## 2021-07-05 ENCOUNTER — Telehealth (HOSPITAL_COMMUNITY): Payer: Self-pay | Admitting: Pharmacist

## 2021-07-05 ENCOUNTER — Other Ambulatory Visit: Payer: Self-pay

## 2021-07-05 ENCOUNTER — Other Ambulatory Visit: Payer: Medicare Other | Admitting: Student

## 2021-07-05 DIAGNOSIS — S31829D Unspecified open wound of left buttock, subsequent encounter: Secondary | ICD-10-CM

## 2021-07-05 DIAGNOSIS — Z515 Encounter for palliative care: Secondary | ICD-10-CM

## 2021-07-05 DIAGNOSIS — L97521 Non-pressure chronic ulcer of other part of left foot limited to breakdown of skin: Secondary | ICD-10-CM

## 2021-07-05 DIAGNOSIS — R531 Weakness: Secondary | ICD-10-CM

## 2021-07-05 DIAGNOSIS — R52 Pain, unspecified: Secondary | ICD-10-CM

## 2021-07-05 DIAGNOSIS — N186 End stage renal disease: Secondary | ICD-10-CM

## 2021-07-05 DIAGNOSIS — Z992 Dependence on renal dialysis: Secondary | ICD-10-CM

## 2021-07-05 NOTE — Telephone Encounter (Signed)
Third attempt, unable to reach pt.

## 2021-07-06 NOTE — Progress Notes (Signed)
[facility] Consult Note Telephone: [phone]  Fax: [phone]   Date of encounter: 07/05/2021  PATIENT NAME: [name] [address] [place] [address]   [phone] (home)  DOB: August 31, 1956 MRN: [id number] PRIMARY CARE PROVIDER:    Fae Pippin,  [place] [address] [phone]  REFERRING PROVIDER:   [name]  RESPONSIBLE PARTY:    Contact Information     Name Relation Home Work Mobile   Jacob,[name] Dailey Spouse  [phone]   Javonta, [place]   [phone]        Due to the COVID-19 crisis, this visit was done via telemedicine from my office and it was initiated and consent by this patient and or family.  I connected with  [facility] on 07/06/21 by a video enabled telemedicine application and verified that I am speaking with the correct person using two identifiers.   I discussed the limitations of evaluation and management by telemedicine. The patient expressed understanding and agreed to proceed.                                      ASSESSMENT AND PLAN / RECOMMENDATIONS:   Advance Care Planning/Goals of Care: Goals include to maximize quality of life and symptom management. Patient/health care surrogate gave his/her permission to discuss.Our advance care planning conversation included a discussion about:    The value and importance of advance care planning  Experiences with loved ones who have been seriously ill or have died  Exploration of personal, cultural or spiritual beliefs that might influence medical decisions  Exploration of goals of care in the event of a sudden injury or illness  Identification and preparation of a healthcare agent  Review and updating or creation of an  advance directive document . Decision not to resuscitate or to de-escalate disease focused treatments due to poor prognosis. CODE STATUS: Full Code  Discussed  palliative medicine versus hospice services.  Patient wishes to receive home health therapy.  He will also need nursing services due to his wounds.  Patient states that he initially did decline SNF rehab upon discharge from hospital.  He does state if he cannot receive home health we will go to a SNF for rehab.  Patient and wife have met with pain management clinic. They state that recommendations have been made to change patient from fentanyl patch to another medication.  We did discuss possibility of palliative medicine managing pain medications if they decide to not continue with pain management clinic. Patient and wife are both aware of pain contract that would need to be signed and requirements.  Discussed if there was a need for patient to have amputation; patient and wife expressed that they would like to try all interventions prior to having amputation.  Symptom Management/Plan:  ESRD-continue HD on Tuesday/Thursday/Saturday as directed.  Pain-chronic pain; patient has follow up appointment scheduled with pain management clinic. Continue fentanyl 50 mcg every 72 hours, percocet TID PRN, lyrica as directed.  Generalized weakness-PT/OT as directed; palliative nurse navigator asked to follow up on request for therapy as agency referral was sent to upon hospital discharge is not local. Patient did decline SNF upon hospital discharge; he states if home health not available, he will go to SNF.   Recommend raised toilet seat due to increased weakness.   Left foot wound- chronic  vascular wound, ? Osteomyelitis. continue Doxycycline through 07/09/2021. Wound care as directed. Follow up with [name] as scheduled. Recommend HH SN for wound care.  Left buttocks wound-continue santyl and dressing change every 3 days as directed. Recommend HH SN for wound care.  Follow up Palliative Care Visit: Palliative care will continue to follow for complex medical decision making, advance care planning, and  clarification of goals. Return in 4 weeks or prn.  I spent 60 minutes providing this consultation. More than 50% of the time in this consultation was spent in counseling and care coordination.   PPS: 40%  HOSPICE ELIGIBILITY/DIAGNOSIS: TBD  Chief Complaint: Palliative Medicine initial consult.  HISTORY OF PRESENT ILLNESS:  [name] is a 65 y.o. 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 Patient recently hospitalized 10/4- 06/30/2021  due to severe sepsis with lactic acidosis secondary to infected penile prosthesis, left foot infection, osteomyelitis, cellulitis, PVD.  Patient resides at home with wife. Patient was discharged with orders for occupational and physical therapy.  Wife states therapy has not started yet. The agency chosen is not in their current area. Patient reports worsening weakness having more difficulty ambulating.  Wife states he was able to take a couple of steps prior to hospitalization now he is only able to stand and pivot. No falls reported. He requires assistance with all adl's. Wife has been performing wound care to the wound on buttocks and left foot. Patient with chronic pain; fentanyl patch 50 mcg every 72 hours, Norco 3 times a day as needed. Wife states they have had two pain management visits recently.  She states they have recommended switching off of the fentanyl patch. He has upcoming follow-up appointment. Wife states [facility] is currently managing medications and the plan is to have patient transition over to pain management although patient and wife have not been agreement with recommendations so far. A 10-point review of systems is negative, except for the pertinent  positives and negatives detailed in the HPI.    History obtained from review of EMR, discussion with primary team, and interview with family, facility staff/caregiver and/or [name].  I reviewed available labs, medications, imaging, studies and related documents from the EMR.  Records reviewed and summarized above.   Physical Exam:  Constitutional: NAD General: frail appearing EYES: anicteric sclera, lids intact, no discharge  ENMT: intact hearing,dentition intact GU: deferred MSK: non ambulatory Skin: no rashes or wounds on visible skin Neuro: generalized weakness, A & O x 3 Psych: non-anxious affect Hem/lymph/immuno: no widespread bruising CURRENT PROBLEM LIST:  Patient Active Problem List   Diagnosis Date Noted   Severe sepsis with lactic acidosis ([place]) 06/14/2021   Infection associated with implanted penile prosthesis ([place]) 06/14/2021   ESRD on hemodialysis ([place]) 06/14/2021   Paroxysmal atrial fibrillation with RVR ([place]) 06/14/2021   Carotid stenosis 06/15/2020   Dyslipidemia 06/15/2020   Severe nonproliferative diabetic retinopathy of right eye, with macular edema, associated with type 2 diabetes mellitus ([place]) 01/18/2020   Severe nonproliferative diabetic retinopathy of left eye, with macular edema, associated with type 2 diabetes mellitus ([place]) 01/04/2020   Retinal hemorrhage of right eye 01/04/2020   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   Pseudoclaudication 11/15/2018   Essential hypertension 09/18/2018   S/P BKA (  below knee amputation), right ([place]) 09/18/2018   Amputation of toe of left foot ([place]) 09/18/2018   Peripheral artery disease ([place]) 09/18/2018   S/P carotid endarterectomy 09/18/2018   OSA on CPAP 09/18/2018   Chronic back pain 09/18/2018   Status post reversal of ileostomy 05/21/2018   Normocytic anemia 02/14/2018   Intra-abdominal abscess ([place]) 11/04/2017   Chronic venous hypertension (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 ([place])  12/11/2016   Ischemic ulcer of left foot, limited to breakdown of skin ([place]) 11/12/2016   Unilateral primary osteoarthritis, left knee 10/11/2016   History of right below knee amputation ([place]) 07/12/2016   Diabetic foot infection ([place]) 05/04/2016   Insulin dependent type 2 diabetes mellitus ([place]) 12/16/2015   Amputated toe ([place]) 10/21/2015   Leg edema, left 09/03/2015   CAD (dz of distal, mid and proximal RCA with implantation of 3 overlapping drug-eluting stent,) 09/03/2015   Chronic diastolic heart failure, NYHA class 2 ([place]) 09/03/2015   Angina pectoris associated with type 2 diabetes mellitus ([place]) 10/28/2014   Hyperlipidemia 08/25/2014   Limb pain 03/20/2013   DJD (degenerative joint disease) 09/25/2012   Migraine 09/25/2012   Neuropathy 09/25/2012   Restless legs syndrome (RLS) 09/25/2012   Chronic obstructive pulmonary disease, unspecified ([place]) 04/25/2012   Unknown cause of morbidity or mortality 04/25/2012   Chronic total occlusion of artery of the extremities ([place]) 04/08/2012   Onychomycosis 02/01/2012   Occlusion and stenosis of carotid artery without mention of cerebral infarction 06/12/2011   GERD (gastroesophageal reflux disease) 05/08/2011   Barrett's esophagus without dysplasia 05/08/2011   Former tobacco use 02/01/2011   PAST MEDICAL HISTORY:  Active Ambulatory Problems    Diagnosis Date Noted   Former tobacco use 02/01/2011   GERD (gastroesophageal reflux disease) 05/08/2011   Barrett's esophagus without dysplasia 05/08/2011   Occlusion and stenosis of carotid artery without mention of cerebral infarction 06/12/2011   Onychomycosis 02/01/2012   Chronic total occlusion of artery of the extremities ([place]) 04/08/2012   Hyperlipidemia 08/25/2014   Angina pectoris associated with type 2 diabetes mellitus ([place]) 10/28/2014   Leg edema, left 09/03/2015   CAD (dz of distal, mid and proximal RCA with implantation of 3 overlapping drug-eluting stent,) 09/03/2015   Chronic  diastolic heart failure, NYHA class 2 ([place]) 09/03/2015   Amputated toe ([place]) 10/21/2015   Insulin dependent type 2 diabetes mellitus ([place]) 12/16/2015   Diabetic foot infection ([place]) 05/04/2016   History of right below knee amputation ([place]) 07/12/2016   Unilateral primary osteoarthritis, left knee 10/11/2016   Ischemic ulcer of left foot, limited to breakdown of skin ([place]) 11/12/2016   Chronic venous hypertension (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 ([place]) 12/11/2016   Intra-abdominal abscess ([place]) 11/04/2017   Normocytic anemia 02/14/2018   Status post reversal of ileostomy 05/21/2018   Essential hypertension 09/18/2018   S/P BKA (below knee amputation), right ([place]) 09/18/2018   Amputation of toe of left foot ([place]) 09/18/2018   Peripheral artery disease ([place]) 09/18/2018   S/P carotid endarterectomy 09/18/2018   OSA on CPAP 09/18/2018   Chronic back pain 09/18/2018   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   Pseudoclaudication 11/15/2018   Severe nonproliferative diabetic retinopathy of left eye, with macular edema, associated with type 2 diabetes mellitus ([place]) 01/04/2020   Retinal hemorrhage of right eye 01/04/2020   Severe nonproliferative diabetic retinopathy of right eye, with macular edema, associated with type 2 diabetes mellitus ([place]) 01/18/2020   Carotid stenosis 06/15/2020   Chronic  obstructive pulmonary disease, unspecified ([place]) 04/25/2012   DJD (degenerative joint disease) 09/25/2012   Dyslipidemia 06/15/2020   Limb pain 03/20/2013   Migraine 09/25/2012   Neuropathy 09/25/2012   Restless legs syndrome (RLS) 09/25/2012   Unknown cause of morbidity or mortality 04/25/2012   Severe sepsis with lactic acidosis ([place]) 06/14/2021   Infection associated with implanted penile prosthesis ([place]) 06/14/2021   ESRD on hemodialysis ([place]) 06/14/2021   Paroxysmal atrial fibrillation with RVR ([place]) 06/14/2021   Resolved Ambulatory Problems    Diagnosis Date Noted    HTN (hypertension) 02/01/2011   Aftercare following surgery of the circulatory system, NEC 06/12/2011   Osteomyelitis ([place]) 11/01/2011   Cellulitis 11/01/2011   Diabetes mellitus, insulin dependent (IDDM), controlled 11/01/2011   Hyponatremia 11/01/2011   Hypokalemia 11/01/2011   Myofasciitis 11/07/2011   Osteomyelitis of toe of right foot ([place]) 11/20/2011   PAD (peripheral artery disease) ([place]) 02/26/2012   Dizziness 02/26/2012   Cellulitis 03/19/2012   Foot ulcer, left ([place]) 11/24/2012   Type 2 diabetes mellitus with right diabetic foot ulcer ([place]) 08/17/2014   Cellulitis of foot, right 08/25/2014   Diabetes mellitus type 2, controlled ([place]) 08/25/2014   Essential hypertension 08/25/2014   Cellulitis of foot without toes, right 08/25/2014   Wound infection ([place]) 09/26/2014   Cellulitis of right foot 09/26/2014   Sepsis ([place]) 01/14/2015   Right foot infection    Cellulitis of left leg 09/03/2015   Cellulitis of left foot 09/03/2015   Acute renal failure ([place]) 09/03/2015   Osteomyelitis of ankle or foot, acute ([place]) 04/25/2016   Pressure ulcer 05/08/2016   Acute osteomyelitis of left foot ([place])    Poorly controlled type 2 diabetes mellitus with peripheral neuropathy ([place]) 05/09/2016   Midfoot ulcer, left, limited to breakdown of skin ([place]) 10/11/2016   Diverticulitis 11/04/2017   Diverticulitis of large intestine with perforation and abscess 01/13/2018   Acute renal failure superimposed on stage 3 chronic kidney disease ([place]) 02/14/2018   Elevated d-dimer 02/14/2018   Pleuritic chest pain 02/14/2018   Unstable angina ([place]) 09/16/2018   Hypertensive emergency 09/18/2018   Past Medical History:  Diagnosis Date   Carotid artery occlusion 11/10/10   Chronic kidney disease    Complication of anesthesia    COPD (chronic obstructive pulmonary disease) ([place])    Diabetes mellitus without complication ([place])    Diverticulosis of colon (without mention of hemorrhage)    Fatty liver     Full dentures    H/O hiatal hernia    History of blood transfusion    Hypertension    Neuromuscular disorder ([place])    Non-pressure chronic ulcer of other part of left foot limited to breakdown of skin ([place]) 11/12/2016   Sleep apnea    Slurred speech    Wears glasses    SOCIAL HX:  Social History   Tobacco Use   Smoking status: Former    Packs/day: 2.00    Years: 35.00    Pack years: 70.00    Types: Cigarettes    Quit date: 10/28/2011    Years since quitting: 9.6   Smokeless tobacco: Never  Substance Use Topics   Alcohol use: Not Currently    Comment: "not in a long time"   FAMILY HX:  Family History  Problem Relation Age of Onset   Heart disease Father        Before age 16-  CAD, BPG   Diabetes Father        Amputation   Cancer  Father        PROSTATE   Hyperlipidemia Father    Hypertension Father    Heart attack Father        Triple BPG   Varicose Veins Father    Colon cancer Brother    Diabetes Brother    Heart disease Brother 77       A-Fib. Before age 65   Hyperlipidemia Brother    Hypertension Brother    Cancer Sister        Breast   Hyperlipidemia Sister    Hypertension Sister    Hypertension Son    Arthritis Other        GRANDMOTHER   Hypertension Other        OTHER FAMILY MEMBERS      ALLERGIES:  Allergies  Allergen Reactions   Contrast Media [Iodinated Diagnostic Agents]     Difficulty breathing     Ivp Dye [Iodinated Diagnostic Agents] Anaphylaxis and Other (See Comments)    Breathing problems, altered mental state    Adhesive [Tape] Rash    Rash after 3-4 days use     PERTINENT MEDICATIONS:  Outpatient Encounter Medications as of 07/05/2021  Medication Sig   amLODipine (NORVASC) 5 MG tablet Take 5 mg by mouth daily.   apixaban (ELIQUIS) 5 MG TABS tablet Take 5 mg by mouth See admin instructions. Only take on non-dialysis days Monday,Wednesday,Friday,sunday   atorvastatin (LIPITOR) 40 MG tablet Take 40 mg by mouth at bedtime.     clopidogrel (PLAVIX) 75 MG tablet Take 1 tablet (75 mg total) by mouth daily.   doxycycline (VIBRA-TABS) 100 MG tablet Take 1 tablet (100 mg total) by mouth every 12 (twelve) hours for 9 days.   famotidine (PEPCID) 40 MG tablet Take 40 mg by mouth daily.   fentaNYL (DURAGESIC - DOSED MCG/HR) 50 MCG/HR Place 50 mcg onto the skin every 3 (three) days.   folic acid-vitamin b complex-vitamin c-selenium-zinc (DIALYVITE) 3 MG TABS tablet Take 1 tablet by mouth daily.   Glucosamine HCl (GLUCOSAMINE PO) Take 100 tablets by mouth 2 (two) times daily.   HUMALOG KWIKPEN 100 UNIT/ML KiwkPen Inject 13 Units into the skin 3 (three) times daily.   insulin glargine (LANTUS) 100 UNIT/ML injection Inject 0.1 mLs (10 Units total) into the skin at bedtime.   ketoconazole (NIZORAL) 2 % cream Apply 1 application topically 2 (two) times daily as needed for irritation.    lidocaine-prilocaine (EMLA) cream Dialysis days Tuesday,Thursday and saturday   linaclotide (LINZESS) 145 MCG CAPS capsule Take 145 mcg by mouth daily as needed (constipation).    loratadine (CLARITIN) 10 MG tablet Take 10 mg by mouth daily.   NEEDLE, REUSABLE, 22 G 22G X 1-1/2" MISC 1 Units by Does not apply route as directed. For B12 IM inj   nitroGLYCERIN (NITRODUR - DOSED IN MG/24 HR) 0.2 mg/hr patch Place 1 patch (0.2 mg total) onto the skin daily.   omega-3 acid ethyl esters (LOVAZA) 1 g capsule Take 2 g by mouth 2 (two) times daily.   ondansetron (ZOFRAN) 4 MG tablet Take 4 mg by mouth every 6 (six) hours as needed for nausea or vomiting.   oxyCODONE-acetaminophen (PERCOCET) 7.5-325 MG tablet Take 1 tablet by mouth 3 (three) times daily as needed for moderate pain.   pantoprazole (PROTONIX) 40 MG tablet Take 40 mg by mouth daily.   Polyvinyl Alcohol-Povidone PF 1.4-0.6 % SOLN Place 1 drop into both eyes 2 (two) times daily as needed (dry eyes).  pregabalin (LYRICA) 75 MG capsule Take 1 capsule (75 mg total) by mouth daily for 14 days.    rOPINIRole (REQUIP) 2 MG tablet Take 2 mg by mouth at bedtime.   Syringe/Needle, Disp, (SYRINGE 3CC/22GX1-1/2") 22G X 1-1/2" 3 ML MISC 1 Syringe by Does not apply route as directed. For b12 IM inj   tamsulosin (FLOMAX) 0.4 MG CAPS capsule Take 0.4 mg by mouth daily.   No facility-administered encounter medications on file as of 07/05/2021.   Thank you for the opportunity to participate in the care of Mr. Zylstra.  The palliative care team will continue to follow. Please call our office at [phone] if we can be of additional assistance.   Ezekiel Slocumb, NP ,   COVID-19 PATIENT SCREENING TOOL Asked and negative response unless otherwise noted:  Have you had symptoms of covid, tested positive or been in contact with someone with symptoms/positive test in the past 5-10 days? No

## 2021-07-10 ENCOUNTER — Other Ambulatory Visit: Payer: 59

## 2021-07-13 LAB — FUNGUS CULTURE RESULT

## 2021-07-13 LAB — FUNGAL ORGANISM REFLEX

## 2021-07-13 LAB — FUNGUS CULTURE WITH STAIN

## 2021-07-14 ENCOUNTER — Telehealth: Payer: Self-pay

## 2021-07-14 NOTE — Telephone Encounter (Signed)
DME order faxed to [facility].

## 2021-07-14 NOTE — Telephone Encounter (Signed)
Home Health referral sent to [facility] for review.

## 2021-07-17 ENCOUNTER — Encounter: Payer: Self-pay | Admitting: Student

## 2021-07-17 ENCOUNTER — Ambulatory Visit (INDEPENDENT_AMBULATORY_CARE_PROVIDER_SITE_OTHER): Payer: 59 | Admitting: Orthopedic Surgery

## 2021-07-17 ENCOUNTER — Other Ambulatory Visit: Payer: Self-pay

## 2021-07-17 ENCOUNTER — Encounter: Payer: Self-pay | Admitting: Orthopedic Surgery

## 2021-07-17 ENCOUNTER — Ambulatory Visit: Payer: 59 | Admitting: Student

## 2021-07-17 VITALS — BP 146/65 | HR 81 | Temp 98.4°F | Resp 16 | Ht 74.0 in | Wt 241.0 lb

## 2021-07-17 DIAGNOSIS — Z89511 Acquired absence of right leg below knee: Secondary | ICD-10-CM

## 2021-07-17 DIAGNOSIS — L97522 Non-pressure chronic ulcer of other part of left foot with fat layer exposed: Secondary | ICD-10-CM

## 2021-07-17 DIAGNOSIS — I951 Orthostatic hypotension: Secondary | ICD-10-CM

## 2021-07-17 DIAGNOSIS — L97929 Non-pressure chronic ulcer of unspecified part of left lower leg with unspecified severity: Secondary | ICD-10-CM

## 2021-07-17 DIAGNOSIS — I480 Paroxysmal atrial fibrillation: Secondary | ICD-10-CM

## 2021-07-17 DIAGNOSIS — I96 Gangrene, not elsewhere classified: Secondary | ICD-10-CM

## 2021-07-17 DIAGNOSIS — I87332 Chronic venous hypertension (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: Secondary | ICD-10-CM | POA: Diagnosis not present

## 2021-07-17 DIAGNOSIS — I453 Trifascicular block: Secondary | ICD-10-CM

## 2021-07-17 DIAGNOSIS — I2510 Atherosclerotic heart disease of native coronary artery without angina pectoris: Secondary | ICD-10-CM

## 2021-07-17 DIAGNOSIS — I739 Peripheral vascular disease, unspecified: Secondary | ICD-10-CM

## 2021-07-17 NOTE — Progress Notes (Signed)
Primary Physician/Referring:  [name], PA-C  Patient ID: [name], male    DOB: 23-Apr-1956, 65 y.o.   MRN: [id number]  Chief Complaint  Patient presents with   Atrial Fibrillation   Follow-up    4 week   HPI:    [name]  is a 65 y.o. male with severe peripheral arterial disease and has history of right below-knee amputation and toe lamputations on the left, multiple peripheral interventions in the past, uncontrolled diabetes mellitus, ESRD, severe diabetic retinopathy, coronary artery disease angioplasty to the right coronary artery on 10/29/2014, right carotid endarterectomy in 2012 , OSA Unable to tolerate CPAP, chronic back pain and neck and back surgery in past and on chronic pain medications. Coronary Angiography 09/17/2018 revealed widely patent stent and no significant new disease.    Patient was admitted to [facility] 06/14/2021 - 06/30/2021 with sepsis secondary to infected penile prosthesis as well as left foot infection concerning for osteomyelitis/cellulitis.  Given non-healing ulcer of the left foot, for which patient follows with [name], there is discussion of need for amputation, however recommended referral arteriogram and revascularization of lower extremities prior to amputation to improve healing.  Patient subsequently underwent successful revascularization with PTA of proximally occluded left anterior tibial artery with 0% residual stenosis and patent LT SFA stents.  Following revascularization left foot ulcers showed improvement, therefore [name] did not recommend amputation during recent hospitalization.  It was treated with antibiotics and discharged.  Notably patient developed bradycardia during hospitalization therefore Coreg was discontinued.  During hospitalization patient was evaluated by palliative care, with whom he continues to follow for ongoing discussions of goals of care.  Patient also continues to follow closely with [name] and saw  him earlier today. [name] noted worsening lower extremity edema, and continued ulcer.  Therefore applied silver alginate to ulcer and used compression wrap, no recommendation for amputation at this time.  Patient now presents to our office for follow up.  Previously ordered lower extremity arterial ultrasound has not been done yet. Patient is without specific complaints today. He remains wheelchair bound and unfortunately continues to smoke e-cigarettes.  Past Medical History:  Diagnosis Date   Carotid artery occlusion 11/10/10   LEFT CAROTID ENDARTERECTOMY   Chronic kidney disease    Complication of anesthesia    BP WENT UP AT [facility] "   COPD (chronic obstructive pulmonary disease) ([place])    pt denies this dx as of 06/01/20 - no inhaler    Diabetes mellitus without complication ([place])    Diverticulitis    Diverticulosis of colon (without mention of hemorrhage)    DJD (degenerative joint disease)    knees/hands/feet/back/neck   Fatty liver    Full dentures    GERD (gastroesophageal reflux disease)    H/O hiatal hernia    History of blood transfusion    with a past surical procedure per patient 06/01/20   Hyperlipidemia    Hypertension    Neuromuscular disorder ([place])    peripheral neuropathy   Non-pressure chronic ulcer of other part of left foot limited to breakdown of skin ([place]) 11/12/2016   Osteomyelitis (HCC)    left 5th metatarsal   PAD (peripheral artery disease) ([place])    Distal aortogram June 2012. Atherectomy left popliteal artery July 2012.    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   Unstable angina ([place]) 09/16/2018   Wears glasses    Past Surgical History:  Procedure Laterality Date   ABDOMINAL AORTOGRAM W/LOWER EXTREMITY N/A 06/23/2021   Procedure: ABDOMINAL AORTOGRAM W/LOWER EXTREMITY;  Surgeon: Nigel Mormon, MD;  Location: [place] [facility];   Service: Cardiovascular;  Laterality: N/A;   AMPUTATION  11/05/2011   Procedure: AMPUTATION RAY;  Surgeon: Wylene Simmer, MD;  Location: [place];  Service: Orthopedics;  Laterality: Right;  Amputation of Right 4&5th Toes   AMPUTATION Left 11/26/2012   Procedure: AMPUTATION RAY;  Surgeon: Wylene Simmer, MD;  Location: [place];  Service: Orthopedics;  Laterality: Left;  fourth ray amputation   AMPUTATION Right 08/27/2014   Procedure: Transmetatarsal Amputation;  Surgeon: Newt Minion, MD;  Location: [place];  Service: Orthopedics;  Laterality: Right;   AMPUTATION Right 01/14/2015   Procedure: AMPUTATION BELOW KNEE;  Surgeon: Newt Minion, MD;  Location: [place];  Service: Orthopedics;  Laterality: Right;   AMPUTATION Left 10/21/2015   Procedure: Left Foot 5th Ray Amputation;  Surgeon: Newt Minion, MD;  Location: [place];  Service: Orthopedics;  Laterality: Left;   ANTERIOR FUSION CERVICAL SPINE  02/06/06   C4-5, C5-6, C6-7; SURGEON DR. MAX COHEN   AV FISTULA PLACEMENT Left 06/02/2020   Procedure: ARTERIOVENOUS (AV) FISTULA CREATION LEFT;  Surgeon: Waynetta Sandy, MD;  Location: [place];  Service: Vascular;  Laterality: Left;   BACK SURGERY     x 3   [place] Left 07/21/2020   Procedure: LEFT UPPER ARM ATERIOVENOUS SUPERFISTULALIZATION;  Surgeon: Waynetta Sandy, MD;  Location: [place];  Service: Vascular;  Laterality: Left;   BELOW KNEE LEG AMPUTATION Right    CARDIAC CATHETERIZATION  10/31/04   2009   CAROTID ENDARTERECTOMY  11/10/10   CAROTID ENDARTERECTOMY Left 11/10/2010   Subtotal occlusion of left internal carotid artery with left hemispheric transient ischemic attacks.   CAROTID STENT     CARPAL TUNNEL RELEASE Right 10/21/2013   Procedure: RIGHT CARPAL TUNNEL RELEASE;  Surgeon: Wynonia Sours, MD;  Location: [place];  Service: Orthopedics;  Laterality: Right;   CHOLECYSTECTOMY     COLON SURGERY     COLONOSCOPY     COLOSTOMY REVERSAL  05/21/2018    ileostomy reversal   CYSTOSCOPY WITH STENT PLACEMENT Bilateral 01/13/2018   Procedure: CYSTOSCOPY WITH BILATERAL URETERAL CATHETER PLACEMENT;  Surgeon: Ardis Hughs, MD;  Location: [facility];  Service: Urology;  Laterality: Bilateral;   ESOPHAGEAL MANOMETRY Bilateral 07/19/2014   Procedure: ESOPHAGEAL MANOMETRY (EM);  Surgeon: Jerene Bears, MD;  Location[facility];  Service: Gastroenterology;  Laterality: Bilateral;   EYE SURGERY Bilateral 2020   cataract   FEMORAL ARTERY STENT     x6   FINGER SURGERY     FOOT SURGERY  04/25/2016    EXCISION BASE 5TH METATARSAL AND PARTIAL CUBOID LEFT FOOT   HERNIA REPAIR     LEFT INGUINAL AND UMBILICAL REPAIRS   HERNIA REPAIR     I & D EXTREMITY Left 04/25/2016   Procedure: EXCISION BASE 5TH METATARSAL AND PARTIAL CUBOID LEFT FOOT;  Surgeon: Newt Minion, MD;  Location: [place];  Service: Orthopedics;  Laterality: Left;   ILEOSTOMY  01/13/2018   Procedure: ILEOSTOMY;  Surgeon: Clovis Riley, MD;  Location: [facility];  Service: General;;   ILEOSTOMY CLOSURE N/A 05/21/2018   Procedure: ILEOSTOMY REVERSAL ERAS PATHWAY;  Surgeon: Clovis Riley, MD;  Location: [facility];  Service: General;  Laterality: N/A;   IR RADIOLOGIST EVAL & MGMT  11/19/2017   IR RADIOLOGIST EVAL & MGMT  12/03/2017   IR RADIOLOGIST EVAL & MGMT  12/18/2017   JOINT REPLACEMENT Right 2001   Total knee   LAMINECTOMY     X 3 LUMBAR AND X 2 CERVICAL SPINE OPERATIONS   LAPAROSCOPIC CHOLECYSTECTOMY W/ CHOLANGIOGRAPHY  11/09/04   SURGEON DR. [place] CATH AND CORONARY ANGIOGRAPHY N/A 09/16/2018   Procedure: LEFT HEART CATH AND CORONARY ANGIOGRAPHY;  Surgeon: Nigel Mormon, MD;  Location: [place] [facility];  Service: Cardiovascular;  Laterality: N/A;   LEFT HEART CATHETERIZATION WITH CORONARY ANGIOGRAM N/A 10/29/2014   Procedure: LEFT HEART CATHETERIZATION WITH CORONARY ANGIOGRAM;  Surgeon: Laverda Page, MD;  Location: [facility];  Service: Cardiovascular;   Laterality: N/A;   LIGATION OF COMPETING BRANCHES OF ARTERIOVENOUS FISTULA Left 07/21/2020   Procedure: LIGATION OF COMPETING BRANCHES OF LEFT UPPER ARM ARTERIOVENOUS FISTULA;  Surgeon: Waynetta Sandy, MD;  Location: [place];  Service: Vascular;  Laterality: Left;   LOWER EXTREMITY ANGIOGRAM N/A 03/19/2012   Procedure: LOWER EXTREMITY ANGIOGRAM;  Surgeon: Burnell Blanks, MD;  Location: [facility] C[facility];  Service: Cardiovascular;  Laterality: N/A;   LOWER EXTREMITY ANGIOGRAPHY N/A 06/20/2021   Procedure: LOWER EXTREMITY ANGIOGRAPHY;  Surgeon: Nigel Mormon, MD;  Location: [place] [facility];  Service: Cardiovascular;  Laterality: N/A;   NECK SURGERY     PARTIAL COLECTOMY N/A 01/13/2018   Procedure: LAPAROSCOPIC ASSISTED   SIGMOID COLECTOMY ILEOSTOMY;  Surgeon: Clovis Riley, MD;  Location: [facility];  Service: General;  Laterality: N/A;   PENILE PROSTHESIS IMPLANT  08/14/05   INFRAPUBIC INSERTION OF INFLATABLE PENILE PROSTHESIS; SURGEON DR. Amalia Hailey   PENILE PROSTHESIS IMPLANT     PERCUTANEOUS CORONARY STENT INTERVENTION (PCI-S) Right 10/29/2014   Procedure: PERCUTANEOUS CORONARY STENT INTERVENTION (PCI-S);  Surgeon: Laverda Page, MD;  Location: [facility] C[facility] LAB;  Service: Cardiovascular;  Laterality: Right;   PERIPHERAL VASCULAR INTERVENTION Left 06/23/2021   Procedure: PERIPHERAL VASCULAR INTERVENTION;  Surgeon: Nigel Mormon, MD;  Location: [place] [facility];  Service: Cardiovascular;  Laterality: Left;   REMOVAL OF PENILE PROSTHESIS N/A 06/14/2021   Procedure: Removal of THREE piece inflatable penile prosthesis;  Surgeon: Lucas Mallow, MD;  Location: [place];  Service: Urology;  Laterality: N/A;   SHOULDER ARTHROSCOPY     SPINE SURGERY     TOE AMPUTATION Left    TONSILLECTOMY     TOTAL KNEE ARTHROPLASTY  07/2002   RIGHT KNEE ; SURGEON  DR. [name] ALSO HAD ARTHROSCOPIC RIGHT KNEE IN  10/2001   TOTAL KNEE ARTHROPLASTY     ULNAR NERVE TRANSPOSITION Right 10/21/2013    Procedure: RIGHT ELBOW  ULNAR NERVE DECOMPRESSION;  Surgeon: Wynonia Sours, MD;  Location: [place];  Service: Orthopedics;  Laterality: Right;   Family History  Problem Relation Age of Onset   Heart disease Father        Before age 86-  CAD, BPG   Diabetes Father        Amputation   Cancer Father        PROSTATE   Hyperlipidemia Father    Hypertension Father    Heart attack Father        Triple BPG   Varicose Veins Father    Colon cancer Brother    Diabetes Brother    Heart  disease Brother 54       A-Fib. Before age 29   Hyperlipidemia Brother    Hypertension Brother    Cancer Sister        Breast   Hyperlipidemia Sister    Hypertension Sister    Hypertension Son    Arthritis Other        GRANDMOTHER   Hypertension Other        OTHER FAMILY MEMBERS   Social History   Tobacco Use   Smoking status: Former    Packs/day: 2.00    Years: 35.00    Pack years: 70.00    Types: Cigarettes    Quit date: 10/28/2011    Years since quitting: 9.7   Smokeless tobacco: Never  Substance Use Topics   Alcohol use: Not Currently    Comment: "not in a long time"   Marital Status: Married   ROS  Review of Systems  Cardiovascular:  Positive for leg swelling. Negative for chest pain, dyspnea on exertion, palpitations and syncope.  Musculoskeletal:  Positive for arthritis and back pain.  Objective   Vitals with BMI 07/17/2021 06/30/2021 06/30/2021  Height 6\' 2"  - -  Weight 241 lbs - -  BMI 66.44 - -  Systolic 034 742 595  Diastolic 65 46 51  Pulse 81 69 64    Blood pressure (!) 146/65, pulse 81, temperature 98.4 F (36.9 C), resp. rate 16, height 6\' 2"  (1.88 m), weight 241 lb (109.3 kg), SpO2 99 %. Body mass index is 30.94 kg/m.   Physical Exam Vitals reviewed.  Constitutional:      General: He is not in acute distress.    Appearance: He is obese.     Comments: Well built and moderately obese in no acute distress  Neck:     Vascular: Carotid bruit  (bilateral) present. No JVD.  Cardiovascular:     Rate and Rhythm: Regular rhythm. Tachycardia present.     Pulses: Intact distal pulses.          Femoral pulses are 1+ on the right side with bruit and 1+ on the left side with bruit.      Popliteal pulses are 0 on the right side and 0 on the left side.       Dorsalis pedis pulses are 0 on the left side. Right dorsalis pedis pulse not accessible.       Posterior tibial pulses are 0 on the left side. Right posterior tibial pulse not accessible.     Heart sounds: Normal heart sounds. No murmur heard.   No gallop.     Comments: Right BKA. Left brachial artery AV Shunt noted Pulmonary:     Effort: Pulmonary effort is normal.     Breath sounds: Normal breath sounds.  Musculoskeletal:     Cervical back: Neck supple.     Left lower leg: Edema (difficult to evaluate due to compression wrapping in place) present.     Comments: Left foot ulcers and swelling, as well as pulses difficult to evaluate as patient has wound dressing and compression wrap in place from Dr. Jess Barters office earlier this morning.   Laboratory examination:   Recent Labs    06/27/21 0131 06/28/21 0205 06/29/21 0233  NA 130* 134* 132*  K 4.5 4.0 4.0  CL 97* 97* 98  CO2 23 27 24   GLUCOSE 184* 198* 188*  BUN 41* 27* 40*  CREATININE 5.24* 4.04* 5.18*  CALCIUM 7.5* 7.8* 8.1*  GFRNONAA 11* 15* 11*  CMP Latest Ref Rng & Units 06/29/2021 06/28/2021 06/27/2021  Glucose 70 - 99 mg/dL 188(H) 198(H) 184(H)  BUN 8 - 23 mg/dL 40(H) 27(H) 41(H)  Creatinine 0.61 - 1.24 mg/dL 5.18(H) 4.04(H) 5.24(H)  Sodium 135 - 145 mmol/L 132(L) 134(L) 130(L)  Potassium 3.5 - 5.1 mmol/L 4.0 4.0 4.5  Chloride 98 - 111 mmol/L 98 97(L) 97(L)  CO2 22 - 32 mmol/L 24 27 23   Calcium 8.9 - 10.3 mg/dL 8.1(L) 7.8(L) 7.5(L)  Total Protein 6.5 - 8.1 g/dL - - -  Total Bilirubin 0.3 - 1.2 mg/dL - - -  Alkaline Phos 38 - 126 U/L - - -  AST 15 - 41 U/L - - -  ALT 0 - 44 U/L - - -   CBC Latest Ref Rng &  Units 06/29/2021 06/28/2021 06/27/2021  WBC 4.0 - 10.5 K/uL 7.8 6.9 9.4  Hemoglobin 13.0 - 17.0 g/dL 9.5(L) 10.0(L) 9.5(L)  Hematocrit 39.0 - 52.0 % 29.1(L) 30.6(L) 30.4(L)  Platelets 150 - 400 K/uL 132(L) 144(L) 164   Lipid Panel     Component Value Date/Time   CHOL 117 05/30/2020 1646   TRIG 133 05/30/2020 1646   HDL 24 (L) 05/30/2020 1646   LDLCALC 69 05/30/2020 1646   HEMOGLOBIN A1C Lab Results  Component Value Date   HGBA1C 7.9 (H) 06/14/2021   MPG 180.03 06/14/2021   External labs:  A1C 6.100 % 03/30/2020   Hemoglobin 10.800 05/23/2020 Platelets 203.000 x1 05/02/2020  Creatinine, Serum 4.140 mg/ 05/02/2020 Potassium 4.300 mm 05/02/2020 ALT (SGPT) 16.000 IU/ 05/02/2020  Cholesterol, total 142.000 m 08/19/2018 HDL 32.000 mg 08/19/2018 LDL 79.000 mg 08/19/2018 Triglycerides 156.000 m 08/19/2018  A1C 6.900 % 09/02/2019  Creatinine, Serum 3.590 mg/ 10/05/2019 Potassium 4.300 mm 10/05/2019 ALT (SGPT) 12.000 IU/ 10/05/2019  Hemoglobin 9.700 11/09/2019; INR 1.000 01/07/2018 Platelets 232.000 x1 10/05/2019  Allergies   Allergies  Allergen Reactions   Contrast Media [Iodinated Diagnostic Agents]     Difficulty breathing     Ivp Dye [Iodinated Diagnostic Agents] Anaphylaxis and Other (See Comments)    Breathing problems, altered mental state    Adhesive [Tape] Rash    Rash after 3-4 days use    Medications Prior to Visit:   Outpatient Medications Prior to Visit  Medication Sig Dispense Refill   amLODipine (NORVASC) 5 MG tablet Take 5 mg by mouth daily.     apixaban (ELIQUIS) 5 MG TABS tablet Take 5 mg by mouth See admin instructions. Only take on non-dialysis days Monday,Wednesday,Friday,sunday     atorvastatin (LIPITOR) 40 MG tablet Take 40 mg by mouth at bedtime.      clopidogrel (PLAVIX) 75 MG tablet Take 1 tablet (75 mg total) by mouth daily. 30 tablet 0   famotidine (PEPCID) 40 MG tablet Take 40 mg by mouth daily.     fentaNYL (DURAGESIC - DOSED MCG/HR) 50  MCG/HR Place 50 mcg onto the skin every 3 (three) days.     folic acid-vitamin b complex-vitamin c-selenium-zinc (DIALYVITE) 3 MG TABS tablet Take 1 tablet by mouth daily.     Glucosamine HCl (GLUCOSAMINE PO) Take 100 tablets by mouth 2 (two) times daily.     HUMALOG KWIKPEN 100 UNIT/ML KiwkPen Inject 13 Units into the skin 3 (three) times daily.  0   insulin glargine (LANTUS) 100 UNIT/ML injection Inject 0.1 mLs (10 Units total) into the skin at bedtime. 10 mL 0   ketoconazole (NIZORAL) 2 % cream Apply 1 application topically 2 (two) times daily as needed for  irritation.   1   lidocaine-prilocaine (EMLA) cream Dialysis days Tuesday,Thursday and saturday     linaclotide (LINZESS) 145 MCG CAPS capsule Take 145 mcg by mouth daily as needed (constipation).      loratadine (CLARITIN) 10 MG tablet Take 10 mg by mouth daily.     NEEDLE, REUSABLE, 22 G 22G X 1-1/2" MISC 1 Units by Does not apply route as directed. For B12 IM inj 10 each 0   nitroGLYCERIN (NITRODUR - DOSED IN MG/24 HR) 0.2 mg/hr patch Place 1 patch (0.2 mg total) onto the skin daily. 30 patch 12   omega-3 acid ethyl esters (LOVAZA) 1 g capsule Take 2 g by mouth 2 (two) times daily.     ondansetron (ZOFRAN) 4 MG tablet Take 4 mg by mouth every 6 (six) hours as needed for nausea or vomiting.     oxyCODONE-acetaminophen (PERCOCET) 7.5-325 MG tablet Take 1 tablet by mouth 3 (three) times daily as needed for moderate pain.     pantoprazole (PROTONIX) 40 MG tablet Take 40 mg by mouth daily.     Polyvinyl Alcohol-Povidone PF 1.4-0.6 % SOLN Place 1 drop into both eyes 2 (two) times daily as needed (dry eyes).      pregabalin (LYRICA) 75 MG capsule Take 1 capsule (75 mg total) by mouth daily for 14 days. 14 capsule 0   rOPINIRole (REQUIP) 2 MG tablet Take 2 mg by mouth at bedtime.     Syringe/Needle, Disp, (SYRINGE 3CC/22GX1-1/2") 22G X 1-1/2" 3 ML MISC 1 Syringe by Does not apply route as directed. For b12 IM inj 10 each 0   tamsulosin (FLOMAX)  0.4 MG CAPS capsule Take 0.4 mg by mouth daily.     No facility-administered medications prior to visit.   Final Medications at End of Visit    Current Meds  Medication Sig   amLODipine (NORVASC) 5 MG tablet Take 5 mg by mouth daily.   apixaban (ELIQUIS) 5 MG TABS tablet Take 5 mg by mouth See admin instructions. Only take on non-dialysis days Monday,Wednesday,Friday,sunday   atorvastatin (LIPITOR) 40 MG tablet Take 40 mg by mouth at bedtime.    clopidogrel (PLAVIX) 75 MG tablet Take 1 tablet (75 mg total) by mouth daily.   famotidine (PEPCID) 40 MG tablet Take 40 mg by mouth daily.   fentaNYL (DURAGESIC - DOSED MCG/HR) 50 MCG/HR Place 50 mcg onto the skin every 3 (three) days.   folic acid-vitamin b complex-vitamin c-selenium-zinc (DIALYVITE) 3 MG TABS tablet Take 1 tablet by mouth daily.   Glucosamine HCl (GLUCOSAMINE PO) Take 100 tablets by mouth 2 (two) times daily.   HUMALOG KWIKPEN 100 UNIT/ML KiwkPen Inject 13 Units into the skin 3 (three) times daily.   insulin glargine (LANTUS) 100 UNIT/ML injection Inject 0.1 mLs (10 Units total) into the skin at bedtime.   ketoconazole (NIZORAL) 2 % cream Apply 1 application topically 2 (two) times daily as needed for irritation.    lidocaine-prilocaine (EMLA) cream Dialysis days Tuesday,Thursday and saturday   linaclotide (LINZESS) 145 MCG CAPS capsule Take 145 mcg by mouth daily as needed (constipation).    loratadine (CLARITIN) 10 MG tablet Take 10 mg by mouth daily.   NEEDLE, REUSABLE, 22 G 22G X 1-1/2" MISC 1 Units by Does not apply route as directed. For B12 IM inj   nitroGLYCERIN (NITRODUR - DOSED IN MG/24 HR) 0.2 mg/hr patch Place 1 patch (0.2 mg total) onto the skin daily.   omega-3 acid ethyl esters (LOVAZA) 1 g capsule  Take 2 g by mouth 2 (two) times daily.   ondansetron (ZOFRAN) 4 MG tablet Take 4 mg by mouth every 6 (six) hours as needed for nausea or vomiting.   oxyCODONE-acetaminophen (PERCOCET) 7.5-325 MG tablet Take 1 tablet by  mouth 3 (three) times daily as needed for moderate pain.   pantoprazole (PROTONIX) 40 MG tablet Take 40 mg by mouth daily.   Polyvinyl Alcohol-Povidone PF 1.4-0.6 % SOLN Place 1 drop into both eyes 2 (two) times daily as needed (dry eyes).    pregabalin (LYRICA) 75 MG capsule Take 1 capsule (75 mg total) by mouth daily for 14 days.   rOPINIRole (REQUIP) 2 MG tablet Take 2 mg by mouth at bedtime.   Syringe/Needle, Disp, (SYRINGE 3CC/22GX1-1/2") 22G X 1-1/2" 3 ML MISC 1 Syringe by Does not apply route as directed. For b12 IM inj   tamsulosin (FLOMAX) 0.4 MG CAPS capsule Take 0.4 mg by mouth daily.    Cardiac Studies:   Left popliteal artery atherectomy in June 2012 and left below knee PTA 2014 by Dr. Brunetta Jeans at [place].   Lexiscan sestamibi stress test 03/18/2017: 1. The resting electrocardiogram demonstrated normal sinus rhythm, RBBB and no resting arrhythmias. Stress EKG is non-diagnostic for ischemia as it a pharmacologic stress using Lexiscan. Occasional PVC noted. Stress symptoms included dyspnea, dizziness and chest pain. 2. The LV is dilated both at rest and stress images. The LV end diastolic volume was 094BS. Perfusion images reveal inferior soft tissue attenuation artifact. There is no ischemia or scar. Overall left ventricular systolic function was normal without regional wall motion abnormalities. The left ventricular ejection fraction was calculated to be 51%. This is a low risk study. Compared to the study done on 10/15/2014, inferior wall ischemia and associated inferior hypokinesis is no longer present.  Coronary angiogram 09/17/18: Widely patent stent stents. Normal LVEF. Coronary Angiogram 10/29/2014: CAD s/p stenting of the distal, mid and proximal RCA with implantation of 3 overlapping drug-eluting stent, from distal to proximal 2.5 x 38 mm, 2.75 x 38 mm and a 2.75 x 20 mm Promus premier DES.   Carotid artery duplex  06/09/2019:  Minimal stenosis in bilateral ICA of 1-15%.  Heterogeneous plaque. Stenosis  in the right external carotid artery (<50%).  Left carotid endarterectomy site is patent.  Right vertebral artery flow is not well visualized. Antegrade left  vertebral artery flow.  No significant change from 03/01/2017. Follow up studies when clinically  Indicated.  Echocardiogram 06/17/2021: 1. Left ventricular ejection fraction, by estimation, is 55 to 60%. The left ventricle has normal function. The left ventricle has no regional wall motion abnormalities. There is mild concentric left ventricular hypertrophy. Left ventricular diastolic parameters are indeterminate.   2. Right ventricular systolic function is normal. The right ventricular size is normal.   3. Left atrial size was moderately dilated.   4. The mitral valve is normal in structure. No evidence of mitral valve regurgitation. Moderate mitral annular calcification.   5. The aortic valve is tricuspid. Aortic valve regurgitation is not visualized. Mild to moderate aortic valve sclerosis/calcification is present, without any evidence of aortic stenosis.  Lower extremity angiography 06/20/2021: No significant Ileofemoral disease Patent Lt SFA stents 2/3 vessel below the knee runoff to the foot Proximally occluded Lt AT  Lower extremity arterial duplex 06/22/2021: Right: Multiphasic waveforms noted in the CFA, FA, PFA, and PTA. Monophasic flow noted in the popliteal artery indicative of a more proximal stenosis. Monophasic flow in the anterior tibial artery.  Abdominal aortogram with lower extremity and peripheral vascular intervention 06/23/2021: Proximally occluded left anterior tibial artery Successful revascularization with PTA 3.0 x 220, 2.0 x 220, 1.5 x 40 mm over-the-wire balloons 0% residual stenosis, but sluggish flow into pedal plantar arch Recommend aspirin and Plavix at least for 1 month.  EKG:   06/12/2021: Atypical atrial flutter with RVR at the rate of 106 bpm, left axis  deviation, left anterior fascicular block.  Right bundle branch block.  Nonspecific T abnormality.  12/12/2020: Sinus bradycardia at rate of 58 bpm with first-degree AV block, left axis deviation, left anterior fascicular block.  Right bundle branch block.  Borderline criteria for LVH by voltage.  No change from 05/27/2020.  Assessment     ICD-10-CM   1. Peripheral artery disease (HCC)  I73.9     2. 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 Paroxysmal atrial fibrillation (HCC)  I48.0     4. Orthostatic hypotension  I95.1     5. 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 Coronary artery disease involving native coronary artery of native heart without angina pectoris  I25.10       No orders of the defined types were placed in this encounter.  There are no discontinued medications.   Recommendations:   [name]  is a 65 y.o. male  with severe peripheral arterial disease and has history of right below-knee amputation and toe lamputations on the left, multiple peripheral interventions in the past, uncontrolled diabetes mellitus, stage IV CKD, coronary artery disease angioplasty to the right coronary artery on 10/29/2014, right carotid endarterectomy in 2012 , OSA Unable to tolerate CPAP, chronic back pain and neck and back surgery in past and on chronic pain medications. Diagnosed with paroxysmal atrial fibrillation 04/2021 and started on Eliquis at that time.   Patient was admitted to [facility] 06/14/2021 - 06/30/2021 with sepsis secondary to infected penile prosthesis as well as left foot infection concerning for osteomyelitis/cellulitis.  Given non-healing ulcer of the left foot, for which patient follows with [name], there is discussion of need for amputation, however recommended referral arteriogram and revascularization of lower extremities prior to amputation to improve healing.  Patient subsequently underwent successful revascularization  with PTA of proximally occluded left anterior tibial artery with 0% residual stenosis and patent LT SFA stents.  Following revascularization left foot ulcers showed improvement, therefore [name] did not recommend amputation during recent hospitalization.  It was treated with antibiotics and discharged.  Notably patient developed bradycardia during hospitalization therefore Coreg was discontinued.  Patient now presents for follow-up.  Given that patient is presently in compression wrapping from Dr. Jess Barters office will hold off on lower extremity arterial duplex at this time.  Advised patient to call our office after follow-up at Dr. Jess Barters office in 1 week and schedule lower extremity ultrasound.  At this time Dr. Geoffery Lyons recommended holding off on amputation.  Patient circulations improved following revascularization and goal is now to maximize ulcer healing in order to minimize amputation if necessary in the future.  Follow-up in 4 weeks after lower extremity ultrasound.  Blood pressure is within acceptable range.  Will not make changes to medications at this time.  Patient was seen in collaboration with Dr. Virgina Jock. He also reviewed patient's chart and examined the patient. Dr. Virgina Jock is in agreement of the plan.    This was a 45-minute encounter with face-to-face counseling,  medical records review, coordination of care, explanation of complex medical issues, complex medical decision making.     Alethia Berthold, PA-C 07/18/2021, 10:32 AM Office: [phone]

## 2021-07-17 NOTE — Progress Notes (Addendum)
Office Visit Note   Patient: [name]           Date of Birth: 1956/08/05           MRN: [id number] Visit Date: 07/17/2021              Requested by: [name], PA-C [address] [place],  [place] [address] PCP: [name], PA-C  Chief Complaint  Patient presents with   Left Leg - Follow-up    S/p peripheral revascularization to lower leg      HPI: Patient is a 65 year old gentleman who is seen in follow-up he is status post revascularization of the left lower extremity with endovascular procedure with cardiology.  Patient states he has had increased swelling in the left leg.  Assessment & Plan: Visit Diagnoses:  1. Chronic venous hypertension (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 (HCC)   2. History of right below knee amputation ([place])   3. Gangrene of left foot ([place])     Plan: We will apply a Dynaflex compression wrap, to the left leg and silver alginate to the left foot ulcer.  Follow-up weekly to change this wrap.  Follow-Up Instructions: Return in about 1 week (around 07/24/2021).   Ortho Exam  Patient is alert, oriented, no adenopathy, well-dressed, normal affect, normal respiratory effort. Examination patient initially postoperatively had minimal swelling in his leg he now has significant increased swelling with blisters x2 in the left leg.  The calf measures 55 cm in circumference.  There is pitting edema but no cellulitis.  Patient has a persistent ulcer over the base of the fifth metatarsal this does not have any tunneling or depth.  The ulcer measures 2 x 4 cm.  Patient still requires a motorized wheelchair for his activities of daily living within the home and outside the home.  Patient is unable to use a motorized scooter for ADLs within the home since he would be unable to accomplish activities of daily living at the kitchen counter and bathroom counter due to the frontal obstruction from the scooter tiller.  Patient is  adept at using the hand controls on a motorized wheelchair and does not have the upper arm strength to use the tiller on a scooter.  Imaging: No results found. No images are attached to the encounter.  Labs: Lab Results  Component Value Date   HGBA1C 7.9 (H) 06/14/2021   HGBA1C 5.7 (H) 09/16/2018   HGBA1C 7.7 (H) 05/15/2018   ESRSEDRATE 17 (H) 06/24/2021   ESRSEDRATE 35 (H) 09/03/2016   ESRSEDRATE 111 (H) 05/11/2016   CRP 0.6 06/24/2021   CRP 1.2 (H) 09/03/2016   CRP 4.7 (H) 05/11/2016   REPTSTATUS 06/22/2021 FINAL 06/21/2021   GRAMSTAIN  06/14/2021    NO ORGANISMS SEEN SQUAMOUS EPITHELIAL CELLS PRESENT ABUNDANT WBC PRESENT,BOTH PMN AND MONONUCLEAR MODERATE GRAM POSITIVE COCCI    CULT  06/21/2021    NO GROWTH Performed at [facility], [place] [address], [place], [place] [address]    LABORGA ENTEROCOCCUS FAECALIS 06/14/2021     Lab Results  Component Value Date   ALBUMIN 2.5 (L) 06/21/2021   ALBUMIN 2.0 (L) 06/17/2021   ALBUMIN 2.7 (L) 06/13/2021   PREALBUMIN 19.0 05/04/2016   PREALBUMIN 25.0 12/16/2015    Lab Results  Component Value Date   MG 1.9 05/24/2018   MG 2.1 05/23/2018   MG 1.3 (L) 05/22/2018   No results found for: [facility]  Lab Results  Component Value  Date   PREALBUMIN 19.0 05/04/2016   PREALBUMIN 25.0 12/16/2015   CBC EXTENDED Latest Ref Rng & Units 06/29/2021 06/28/2021 06/27/2021  WBC 4.0 - 10.5 K/uL 7.8 6.9 9.4  RBC 4.22 - 5.81 MIL/uL 3.05(L) 3.24(L) 3.13(L)  HGB 13.0 - 17.0 g/dL 9.5(L) 10.0(L) 9.5(L)  HCT 39.0 - 52.0 % 29.1(L) 30.6(L) 30.4(L)  PLT 150 - 400 K/uL 132(L) 144(L) 164  NEUTROABS 1.7 - 7.7 K/uL - - -  LYMPHSABS 0.7 - 4.0 K/uL - - -     There is no height or weight on file to calculate BMI.  Orders:  No orders of the defined types were placed in this encounter.  No orders of the defined types were placed in this encounter.    Procedures: No procedures performed  Clinical Data: No additional  findings.  ROS:  All other systems negative, except as noted in the HPI. Review of Systems  Objective: Vital Signs: There were no vitals taken for this visit.  Specialty Comments:  No specialty comments available.  PMFS History: Patient Active Problem List   Diagnosis Date Noted   Severe sepsis with lactic acidosis ([place]) 06/14/2021   Infection associated with implanted penile prosthesis ([place]) 06/14/2021   ESRD on hemodialysis ([place]) 06/14/2021   Paroxysmal atrial fibrillation with RVR ([place]) 06/14/2021   Carotid stenosis 06/15/2020   Dyslipidemia 06/15/2020   Severe nonproliferative diabetic retinopathy of right eye, with macular edema, associated with type 2 diabetes mellitus ([place]) 01/18/2020   Severe nonproliferative diabetic retinopathy of left eye, with macular edema, associated with type 2 diabetes mellitus ([place]) 01/04/2020   Retinal hemorrhage of right eye 01/04/2020   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) 12/11/2016   Ischemic ulcer of left foot, limited to breakdown of skin ([place]) 11/12/2016   Unilateral primary osteoarthritis, left knee 10/11/2016   History of right below knee amputation ([place]) 07/12/2016   Diabetic foot infection ([place]) 05/04/2016   Insulin dependent type 2 diabetes mellitus ([place]) 12/16/2015   Amputated toe ([place]) 10/21/2015   Leg edema, left 09/03/2015   CAD (dz of distal, mid and proximal RCA with implantation of 3 overlapping  drug-eluting stent,) 09/03/2015   Chronic diastolic heart failure, NYHA class 2 ([place]) 09/03/2015   Angina pectoris associated with type 2 diabetes mellitus ([place]) 10/28/2014   Hyperlipidemia 08/25/2014   Limb pain 03/20/2013   DJD (degenerative joint disease) 09/25/2012   Migraine 09/25/2012   Neuropathy 09/25/2012   Restless legs syndrome (RLS) 09/25/2012   Chronic obstructive pulmonary disease, unspecified ([place]) 04/25/2012   Unknown cause of morbidity or mortality 04/25/2012   Chronic total occlusion of artery of the extremities ([place]) 04/08/2012   Onychomycosis 02/01/2012   Occlusion and stenosis of carotid artery without mention of cerebral infarction 06/12/2011   GERD (gastroesophageal reflux disease) 05/08/2011   Barrett's esophagus without dysplasia 05/08/2011   Former tobacco use 02/01/2011   Past Medical History:  Diagnosis Date   Carotid artery occlusion 11/10/10   LEFT CAROTID ENDARTERECTOMY   Chronic kidney  disease    Complication of anesthesia    BP WENT UP AT [facility] "   COPD (chronic obstructive pulmonary disease) ([place])    pt denies this dx as of 06/01/20 - no inhaler    Diabetes mellitus without complication ([place])    Diverticulitis    Diverticulosis of colon (without mention of hemorrhage)    DJD (degenerative joint disease)    knees/hands/feet/back/neck   Fatty liver    Full dentures    GERD (gastroesophageal reflux disease)    H/O hiatal hernia    History of blood transfusion    with a past surical procedure per patient 06/01/20   Hyperlipidemia    Hypertension    Neuromuscular disorder ([place])    peripheral neuropathy   Non-pressure chronic ulcer of other part of left foot limited to breakdown of skin ([place]) 11/12/2016   Osteomyelitis (HCC)    left 5th metatarsal   PAD (peripheral artery disease) ([place])    Distal aortogram June 2012. Atherectomy left popliteal artery July 2012.    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 Before age 64   Hyperlipidemia Brother    Hypertension Brother    Cancer Sister        Breast   Hyperlipidemia Sister    Hypertension Sister    Hypertension Son    Arthritis Other        GRANDMOTHER   Hypertension Other        OTHER FAMILY MEMBERS    Past Surgical History:  Procedure Laterality Date   ABDOMINAL AORTOGRAM W/LOWER EXTREMITY N/A 06/23/2021   Procedure: ABDOMINAL AORTOGRAM W/LOWER EXTREMITY;  Surgeon: Nigel Mormon, MD;  Location: [place] [facility];  Service: Cardiovascular;  Laterality: N/A;   AMPUTATION  11/05/2011   Procedure: AMPUTATION RAY;  Surgeon: Wylene Simmer, MD;  Location: [facility];  Service: Orthopedics;  Laterality: Right;  Amputation of Right 4&5th Toes   AMPUTATION Left 11/26/2012   Procedure: AMPUTATION RAY;  Surgeon: Wylene Simmer, MD;  Location: [place];  Service: Orthopedics;  Laterality: Left;  fourth ray amputation   AMPUTATION Right 08/27/2014   Procedure: Transmetatarsal Amputation;  Surgeon: Newt Minion, MD;  Location: [place];  Service: Orthopedics;  Laterality: Right;   AMPUTATION Right 01/14/2015   Procedure: AMPUTATION BELOW KNEE;  Surgeon: Newt Minion, MD;  Location: [place];  Service: Orthopedics;  Laterality: Right;   AMPUTATION Left 10/21/2015   Procedure: Left Foot 5th Ray Amputation;  Surgeon: Newt Minion, MD;  Location: [place];  Service: Orthopedics;  Laterality: Left;   ANTERIOR FUSION CERVICAL SPINE  02/06/06   C4-5, C5-6, C6-7;  SURGEON DR.  MAX COHEN   AV FISTULA PLACEMENT Left 06/02/2020   Procedure: ARTERIOVENOUS (AV) FISTULA CREATION LEFT;  Surgeon: Waynetta Sandy, MD;  Location: [place];  Service: Vascular;  Laterality: Left;   BACK SURGERY     x 3   [place] Left 07/21/2020   Procedure: LEFT UPPER ARM ATERIOVENOUS SUPERFISTULALIZATION;  Surgeon: Waynetta Sandy, MD;  Location: [place];  Service: Vascular;  Laterality: Left;   BELOW KNEE LEG AMPUTATION Right    CARDIAC CATHETERIZATION  10/31/04   2009   CAROTID ENDARTERECTOMY  11/10/10   CAROTID ENDARTERECTOMY Left 11/10/2010   Subtotal occlusion of left internal carotid artery with left hemispheric transient ischemic attacks.   CAROTID STENT     CARPAL TUNNEL RELEASE Right 10/21/2013   Procedure: RIGHT CARPAL TUNNEL RELEASE;  Surgeon: Wynonia Sours, MD;  Location: [place];  Service: Orthopedics;  Laterality: Right;   CHOLECYSTECTOMY     COLON SURGERY     COLONOSCOPY     COLOSTOMY REVERSAL  05/21/2018   ileostomy reversal   CYSTOSCOPY WITH STENT PLACEMENT Bilateral 01/13/2018   Procedure: CYSTOSCOPY WITH BILATERAL URETERAL CATHETER PLACEMENT;  Surgeon: Ardis Hughs, MD;  Location: [facility];  Service: Urology;  Laterality: Bilateral;   ESOPHAGEAL MANOMETRY Bilateral 07/19/2014   Procedure: ESOPHAGEAL MANOMETRY (EM);  Surgeon: Jerene Bears, MD;  [facility];  Service: Gastroenterology;  Laterality: Bilateral;   EYE SURGERY Bilateral 2020   cataract   FEMORAL ARTERY STENT     x6   FINGER SURGERY     FOOT SURGERY  04/25/2016    EXCISION BASE 5TH METATARSAL AND PARTIAL CUBOID LEFT FOOT   HERNIA REPAIR     LEFT INGUINAL AND UMBILICAL REPAIRS   HERNIA REPAIR     I & D EXTREMITY Left 04/25/2016   Procedure: EXCISION BASE 5TH METATARSAL AND PARTIAL CUBOID LEFT FOOT;  Surgeon: Newt Minion, MD;  Location: [facility];  Service: Orthopedics;  Laterality: Left;   ILEOSTOMY  01/13/2018   Procedure: ILEOSTOMY;   Surgeon: Clovis Riley, MD;  Location: [facility];  Service: General;;   ILEOSTOMY CLOSURE N/A 05/21/2018   Procedure: ILEOSTOMY REVERSAL ERAS PATHWAY;  Surgeon: Clovis Riley, MD;  Location: [place];  Service: General;  Laterality: N/A;   IR RADIOLOGIST EVAL & MGMT  11/19/2017   IR RADIOLOGIST EVAL & MGMT  12/03/2017   IR RADIOLOGIST EVAL & MGMT  12/18/2017   JOINT REPLACEMENT Right 2001   Total knee   LAMINECTOMY     X 3 LUMBAR AND X 2 CERVICAL SPINE OPERATIONS   LAPAROSCOPIC CHOLECYSTECTOMY W/ CHOLANGIOGRAPHY  11/09/04   SURGEON DR. [place] CATH AND CORONARY ANGIOGRAPHY N/A 09/16/2018   Procedure: LEFT HEART CATH AND CORONARY ANGIOGRAPHY;  Surgeon: Nigel Mormon, MD;  Location: [place] [facility];  Service: Cardiovascular;  Laterality: N/A;   LEFT HEART CATHETERIZATION WITH CORONARY ANGIOGRAM N/A 10/29/2014   Procedure: LEFT HEART CATHETERIZATION WITH CORONARY ANGIOGRAM;  Surgeon: Laverda Page, MD;  Location[facility];  Service: Cardiovascular;  Laterality: N/A;   LIGATION OF COMPETING BRANCHES OF ARTERIOVENOUS FISTULA Left 07/21/2020   Procedure: LIGATION OF COMPETING BRANCHES OF LEFT UPPER ARM ARTERIOVENOUS FISTULA;  Surgeon: Waynetta Sandy, MD;  Location: [place];  Service: Vascular;  Laterality: Left;   LOWER EXTREMITY ANGIOGRAM N/A 03/19/2012   Procedure: LOWER EXTREMITY ANGIOGRAM;  Surgeon: Burnell Blanks, MD;  Location: [facility];  Service:  Cardiovascular;  Laterality: N/A;   LOWER EXTREMITY ANGIOGRAPHY N/A 06/20/2021   Procedure: LOWER EXTREMITY ANGIOGRAPHY;  Surgeon: Nigel Mormon, MD;  Location: [place] [facility];  Service: Cardiovascular;  Laterality: N/A;   NECK SURGERY     PARTIAL COLECTOMY N/A 01/13/2018   Procedure: LAPAROSCOPIC ASSISTED   SIGMOID COLECTOMY ILEOSTOMY;  Surgeon: Clovis Riley, MD;  [facility];  Service: General;  Laterality: N/A;   PENILE PROSTHESIS IMPLANT  08/14/05   INFRAPUBIC INSERTION OF  INFLATABLE PENILE PROSTHESIS; SURGEON DR. Amalia Hailey   PENILE PROSTHESIS IMPLANT     PERCUTANEOUS CORONARY STENT INTERVENTION (PCI-S) Right 10/29/2014   Procedure: PERCUTANEOUS CORONARY STENT INTERVENTION (PCI-S);  Surgeon: Laverda Page, MD;  Location: [facility];  Service: Cardiovascular;  Laterality: Right;   PERIPHERAL VASCULAR INTERVENTION Left 06/23/2021   Procedure: PERIPHERAL VASCULAR INTERVENTION;  Surgeon: Nigel Mormon, MD;  Location: [place] [facility];  Service: Cardiovascular;  Laterality: Left;   REMOVAL OF PENILE PROSTHESIS N/A 06/14/2021   Procedure: Removal of THREE piece inflatable penile prosthesis;  Surgeon: Lucas Mallow, MD;  Location: [place];  Service: Urology;  Laterality: N/A;   SHOULDER ARTHROSCOPY     SPINE SURGERY     TOE AMPUTATION Left    TONSILLECTOMY     TOTAL KNEE ARTHROPLASTY  07/2002   RIGHT KNEE ; SURGEON  DR. [name] ALSO HAD ARTHROSCOPIC RIGHT KNEE IN  10/2001   TOTAL KNEE ARTHROPLASTY     ULNAR NERVE TRANSPOSITION Right 10/21/2013   Procedure: RIGHT ELBOW  ULNAR NERVE DECOMPRESSION;  Surgeon: Wynonia Sours, MD;  Location: [place];  Service: Orthopedics;  Laterality: Right;   Social History   Occupational History   Occupation: Magazine features editor: UNEMPLOYED  Tobacco Use   Smoking status: Former    Packs/day: 2.00    Years: 35.00    Pack years: 70.00    Types: Cigarettes    Quit date: 10/28/2011    Years since quitting: 9.7   Smokeless tobacco: Never  Vaping Use   Vaping Use: Some days   Substances: Nicotine  Substance and Sexual Activity   Alcohol use: Not Currently    Comment: "not in a long time"   Drug use: Never   Sexual activity: Yes    Birth control/protection: Implant    Comment: penile implant

## 2021-07-21 ENCOUNTER — Telehealth: Payer: Self-pay | Admitting: Orthopedic Surgery

## 2021-07-21 NOTE — Telephone Encounter (Signed)
[employer] called and says that they are faxing a form over from insurance that needs to be filled out and she also needs pt note refaxed. They did not get all 12 pages.   CB [phone] Fax # [phone]

## 2021-07-24 ENCOUNTER — Ambulatory Visit: Payer: 59 | Admitting: Orthopedic Surgery

## 2021-07-25 NOTE — Telephone Encounter (Signed)
Notes from 04/03/21 and 07/17/21 re faxed and will await paperwork from [employer]

## 2021-07-26 ENCOUNTER — Ambulatory Visit (INDEPENDENT_AMBULATORY_CARE_PROVIDER_SITE_OTHER): Payer: 59 | Admitting: Family

## 2021-07-26 ENCOUNTER — Encounter: Payer: Self-pay | Admitting: Family

## 2021-07-26 ENCOUNTER — Other Ambulatory Visit: Payer: Self-pay

## 2021-07-26 DIAGNOSIS — E11628 Type 2 diabetes mellitus with other skin complications: Secondary | ICD-10-CM | POA: Diagnosis not present

## 2021-07-26 DIAGNOSIS — L97521 Non-pressure chronic ulcer of other part of left foot limited to breakdown of skin: Secondary | ICD-10-CM

## 2021-07-26 DIAGNOSIS — Z89511 Acquired absence of right leg below knee: Secondary | ICD-10-CM

## 2021-07-26 DIAGNOSIS — L97929 Non-pressure chronic ulcer of unspecified part of left lower leg with unspecified severity: Secondary | ICD-10-CM

## 2021-07-26 DIAGNOSIS — L089 Local infection of the skin and subcutaneous tissue, unspecified: Secondary | ICD-10-CM

## 2021-07-26 DIAGNOSIS — I87332 Chronic venous hypertension (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: Secondary | ICD-10-CM

## 2021-07-26 MED ORDER — DOXYCYCLINE HYCLATE 100 MG PO TABS: 100.0000 mg | ORAL_TABLET | Freq: Two times a day (BID) | ORAL | 0 refills | Status: DC

## 2021-07-26 NOTE — Progress Notes (Signed)
Office Visit Note   Patient: [name]           Date of Birth: 15-Feb-1956           MRN: [id number] Visit Date: 07/17/2021              Requested by: [name], PA-C [address] [place],   [id number] PCP: [name], PA-C  Chief Complaint  Patient presents with   Left Leg - Follow-up    S/p peripheral revascularization to lower leg      HPI: The patient is a 65 year old gentleman seen today in follow-up he has been in a compression wrap for swelling and ulceration to his left lower extremity following a revascularization of his left lower extremity with cardiology  Did have silver alginate over the ulcer.  Presents today for wound care.  Assessment & Plan: Visit Diagnoses:  1. Chronic venous hypertension (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 (HCC)   2. History of right below knee amputation ([place])   3. Gangrene of left foot ([place])     Plan: We will apply a Dynaflex compression wrap, to the left leg and silver alginate to the left foot ulcer.  Follow-up weekly to change this wrap.  Follow-Up Instructions: Return in about 1 week (around 07/24/2021).   Ortho Exam  Patient is alert, oriented, no adenopathy, well-dressed, normal affect, normal respiratory effort. Examination   He has improved swelling to the left leg. Wrinkling of skin from compression. No ulcerations. No erythema or weeping. Patient has a persistent ulcer over the base of the fifth metatarsal this does not have any tunneling or depth. Foul odor. The ulcer measures 2 x 4 cm. Is filled in with 100% fibrinous tissue. No erythema or purulence.    Imaging: No results found. No images are attached to the encounter.  Labs: Lab Results  Component Value Date   HGBA1C 7.9 (H) 06/14/2021   HGBA1C 5.7 (H) 09/16/2018   HGBA1C 7.7 (H) 05/15/2018   ESRSEDRATE 17 (H) 06/24/2021   ESRSEDRATE 35 (H) 09/03/2016   ESRSEDRATE 111 (H) 05/11/2016   CRP 0.6 06/24/2021   CRP  1.2 (H) 09/03/2016   CRP 4.7 (H) 05/11/2016   REPTSTATUS 06/22/2021 FINAL 06/21/2021   GRAMSTAIN  06/14/2021    NO ORGANISMS SEEN SQUAMOUS EPITHELIAL CELLS PRESENT ABUNDANT WBC PRESENT,BOTH PMN AND MONONUCLEAR MODERATE GRAM POSITIVE COCCI    CULT  06/21/2021    NO GROWTH Performed at [facility], [place] [address] [place],  [address]    LABORGA ENTEROCOCCUS FAECALIS 06/14/2021     Lab Results  Component Value Date   ALBUMIN 2.5 (L) 06/21/2021   ALBUMIN 2.0 (L) 06/17/2021   ALBUMIN 2.7 (L) 06/13/2021   PREALBUMIN 19.0 05/04/2016   PREALBUMIN 25.0 12/16/2015    Lab Results  Component Value Date   MG 1.9 05/24/2018   MG 2.1 05/23/2018   MG 1.3 (L) 05/22/2018   No results found for: VD25OH  Lab Results  Component Value Date   PREALBUMIN 19.0 05/04/2016   PREALBUMIN 25.0 12/16/2015   CBC EXTENDED Latest Ref Rng & Units 06/29/2021 06/28/2021 06/27/2021  WBC 4.0 - 10.5 K/uL 7.8 6.9 9.4  RBC 4.22 - 5.81 MIL/uL 3.05(L) 3.24(L) 3.13(L)  HGB 13.0 - 17.0 g/dL 9.5(L) 10.0(L) 9.5(L)  HCT 39.0 - 52.0 % 29.1(L) 30.6(L) 30.4(L)  PLT 150 - 400 K/uL 132(L) 144(L) 164  NEUTROABS 1.7 - 7.7 K/uL - - -  LYMPHSABS 0.7 -  4.0 K/uL - - -     There is no height or weight on file to calculate BMI.  Orders:  No orders of the defined types were placed in this encounter.  No orders of the defined types were placed in this encounter.    Procedures: No procedures performed  Clinical Data: No additional findings.  ROS:  All other systems negative, except as noted in the HPI. Review of Systems  Constitutional:  Negative for chills and fever.  Cardiovascular:  Positive for leg swelling.  Skin:  Positive for wound.   Objective: Vital Signs: There were no vitals taken for this visit.  Specialty Comments:  No specialty comments available.  PMFS History: Patient Active Problem List   Diagnosis Date Noted   Severe sepsis with lactic acidosis ([place]) 06/14/2021    Infection associated with implanted penile prosthesis ([place]) 06/14/2021   ESRD on hemodialysis ([place]) 06/14/2021   Paroxysmal atrial fibrillation with RVR ([place]) 06/14/2021   Carotid stenosis 06/15/2020   Dyslipidemia 06/15/2020   Severe nonproliferative diabetic retinopathy of right eye, with macular edema, associated with type 2 diabetes mellitus ([place]) 01/18/2020   Severe nonproliferative diabetic retinopathy of left eye, with macular edema, associated with type 2 diabetes mellitus ([place]) 01/04/2020   Retinal hemorrhage of right eye 01/04/2020   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   Pseudoclaudication 11/15/2018   Essential hypertension 09/18/2018   S/P BKA (below knee amputation), right ([place]) 09/18/2018   Amputation of toe of left foot ([place]) 09/18/2018   Peripheral artery disease ([place]) 09/18/2018   S/P carotid endarterectomy 09/18/2018   OSA on CPAP 09/18/2018   Chronic back pain 09/18/2018   Status post reversal of ileostomy 05/21/2018   Normocytic anemia 02/14/2018   Intra-abdominal abscess ([place]) 11/04/2017   Chronic venous hypertension (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 ([place]) 12/11/2016   Ischemic ulcer of left foot, limited to breakdown of skin ([place]) 11/12/2016   Unilateral primary osteoarthritis, left knee 10/11/2016   History of right below knee amputation ([place]) 07/12/2016   Diabetic foot infection ([place]) 05/04/2016   Insulin dependent type 2 diabetes mellitus ([place]) 12/16/2015   Amputated toe ([place]) 10/21/2015   Leg edema, left 09/03/2015   CAD (dz of distal, mid and proximal RCA with implantation of 3 overlapping drug-eluting stent,) 09/03/2015   Chronic diastolic heart failure, NYHA class 2 ([place]) 09/03/2015   Angina pectoris associated with type 2 diabetes mellitus ([place]) 10/28/2014   Hyperlipidemia 08/25/2014   Limb pain 03/20/2013   DJD (degenerative joint disease) 09/25/2012   Migraine 09/25/2012   Neuropathy 09/25/2012   Restless legs syndrome  (RLS) 09/25/2012   Chronic obstructive pulmonary disease, unspecified ([place]) 04/25/2012   Unknown cause of morbidity or mortality 04/25/2012   Chronic total occlusion of artery of the extremities ([place]) 04/08/2012   Onychomycosis 02/01/2012   Occlusion and stenosis of carotid artery without mention of cerebral infarction 06/12/2011   GERD (gastroesophageal reflux disease) 05/08/2011   Barrett's esophagus without dysplasia 05/08/2011   Former tobacco use 02/01/2011   Past Medical History:  Diagnosis Date   Carotid artery occlusion 11/10/10   LEFT CAROTID ENDARTERECTOMY   Chronic kidney disease    Complication of anesthesia    BP WENT UP AT [facility] "   COPD (chronic obstructive pulmonary disease) ([place])    pt denies this dx as of 06/01/20 - no inhaler    Diabetes mellitus without complication ([place])    Diverticulitis    Diverticulosis of colon (without mention of hemorrhage)  DJD (degenerative joint disease)    knees/hands/feet/back/neck   Fatty liver    Full dentures    GERD (gastroesophageal reflux disease)    H/O hiatal hernia    History of blood transfusion    with a past surical procedure per patient 06/01/20   Hyperlipidemia    Hypertension    Neuromuscular disorder ([place])    peripheral neuropathy   Non-pressure chronic ulcer of other part of left foot limited to breakdown of skin ([place]) 11/12/2016   Osteomyelitis (HCC)    left 5th metatarsal   PAD (peripheral artery disease) ([place])    Distal aortogram June 2012. Atherectomy left popliteal artery July 2012.    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 Before age 87   Hyperlipidemia Brother    Hypertension Brother    Cancer Sister        Breast   Hyperlipidemia Sister    Hypertension Sister    Hypertension Son    Arthritis Other        GRANDMOTHER   Hypertension Other        OTHER FAMILY MEMBERS    Past Surgical History:  Procedure Laterality Date   ABDOMINAL AORTOGRAM W/LOWER EXTREMITY N/A 06/23/2021   Procedure: ABDOMINAL AORTOGRAM W/LOWER EXTREMITY;  Surgeon: Nigel Mormon, MD;  Location: [facility];  Service: Cardiovascular;  Laterality: N/A;   AMPUTATION  11/05/2011   Procedure: AMPUTATION RAY;  Surgeon: Wylene Simmer, MD;  Location: [facility];  Service: Orthopedics;  Laterality: Right;  Amputation of Right 4&5th Toes   AMPUTATION Left 11/26/2012   Procedure: AMPUTATION RAY;  Surgeon: Wylene Simmer, MD;  Location: [facility];  Service: Orthopedics;  Laterality: Left;  fourth ray amputation   AMPUTATION Right 08/27/2014   Procedure: Transmetatarsal Amputation;  Surgeon: Newt Minion, MD;  Location: [facility];  Service: Orthopedics;  Laterality: Right;   AMPUTATION Right 01/14/2015   Procedure: AMPUTATION BELOW KNEE;  Surgeon: Newt Minion, MD;  Location: [place];  Service: Orthopedics;  Laterality: Right;   AMPUTATION Left 10/21/2015   Procedure: Left Foot 5th Ray Amputation;  Surgeon: Newt Minion, MD;  Location: [place];  Service: Orthopedics;  Laterality: Left;   ANTERIOR FUSION CERVICAL SPINE  02/06/06   C4-5, C5-6, C6-7; SURGEON DR. MAX COHEN   AV FISTULA PLACEMENT Left 06/02/2020   Procedure: ARTERIOVENOUS (AV) FISTULA CREATION LEFT;  Surgeon: Waynetta Sandy, MD;  Location: [facility];  Service: Vascular;  Laterality: Left;   BACK SURGERY     x 3   [place] Left 07/21/2020   Procedure: LEFT UPPER ARM ATERIOVENOUS  SUPERFISTULALIZATION;  Surgeon: Servando Snare  Harrell Gave, MD;  Location: [facility];  Service: Vascular;  Laterality: Left;   BELOW KNEE LEG AMPUTATION Right    CARDIAC CATHETERIZATION  10/31/04   2009   CAROTID ENDARTERECTOMY  11/10/10   CAROTID ENDARTERECTOMY Left 11/10/2010   Subtotal occlusion of left internal carotid artery with left hemispheric transient ischemic attacks.   CAROTID STENT     CARPAL TUNNEL RELEASE Right 10/21/2013   Procedure: RIGHT CARPAL TUNNEL RELEASE;  Surgeon: Wynonia Sours, MD;  Location: [facility];  Service: Orthopedics;  Laterality: Right;   CHOLECYSTECTOMY     COLON SURGERY     COLONOSCOPY     COLOSTOMY REVERSAL  05/21/2018   ileostomy reversal   CYSTOSCOPY WITH STENT PLACEMENT Bilateral 01/13/2018   Procedure: CYSTOSCOPY WITH BILATERAL URETERAL CATHETER PLACEMENT;  Surgeon: Ardis Hughs, MD;  [facility];  Service: Urology;  Laterality: Bilateral;   ESOPHAGEAL MANOMETRY Bilateral 07/19/2014   Procedure: ESOPHAGEAL MANOMETRY (EM);  Surgeon: Jerene Bears, MD;  [facility];  Service: Gastroenterology;  Laterality: Bilateral;   EYE SURGERY Bilateral 2020   cataract   FEMORAL ARTERY STENT     x6   FINGER SURGERY     FOOT SURGERY  04/25/2016    EXCISION BASE 5TH METATARSAL AND PARTIAL CUBOID LEFT FOOT   HERNIA REPAIR     LEFT INGUINAL AND UMBILICAL REPAIRS   HERNIA REPAIR     I & D EXTREMITY Left 04/25/2016   Procedure: EXCISION BASE 5TH METATARSAL AND PARTIAL CUBOID LEFT FOOT;  Surgeon: Newt Minion, MD;  Location: [place] [facility];  Service: Orthopedics;  Laterality: Left;   ILEOSTOMY  01/13/2018   Procedure: ILEOSTOMY;  Surgeon: Clovis Riley, MD;  Location[facility];  Service: General;;   ILEOSTOMY CLOSURE N/A 05/21/2018   Procedure: ILEOSTOMY REVERSAL ERAS PATHWAY;  Surgeon: Clovis Riley, MD;  Location: [facility] [place];  Service: General;  Laterality: N/A;   IR RADIOLOGIST EVAL & MGMT  11/19/2017   IR RADIOLOGIST EVAL & MGMT  12/03/2017   IR  RADIOLOGIST EVAL & MGMT  12/18/2017   JOINT REPLACEMENT Right 2001   Total knee   LAMINECTOMY     X 3 LUMBAR AND X 2 CERVICAL SPINE OPERATIONS   LAPAROSCOPIC CHOLECYSTECTOMY W/ CHOLANGIOGRAPHY  11/09/04   SURGEON DR. [place] CATH AND CORONARY ANGIOGRAPHY N/A 09/16/2018   Procedure: LEFT HEART CATH AND CORONARY ANGIOGRAPHY;  Surgeon: Nigel Mormon, MD;  Location: [facility];  Service: Cardiovascular;  Laterality: N/A;   LEFT HEART CATHETERIZATION WITH CORONARY ANGIOGRAM N/A 10/29/2014   Procedure: LEFT HEART CATHETERIZATION WITH CORONARY ANGIOGRAM;  Surgeon: Laverda Page, MD;  Location: [facility];  Service: Cardiovascular;  Laterality: N/A;   LIGATION OF COMPETING BRANCHES OF ARTERIOVENOUS FISTULA Left 07/21/2020   Procedure: LIGATION OF COMPETING BRANCHES OF LEFT UPPER ARM ARTERIOVENOUS FISTULA;  Surgeon: Waynetta Sandy, MD;  Location: [place];  Service: Vascular;  Laterality: Left;   LOWER EXTREMITY ANGIOGRAM N/A 03/19/2012   Procedure: LOWER EXTREMITY ANGIOGRAM;  Surgeon: Burnell Blanks, MD;  Location: [facility];  Service: Cardiovascular;  Laterality: N/A;   LOWER EXTREMITY ANGIOGRAPHY N/A 06/20/2021   Procedure: LOWER EXTREMITY ANGIOGRAPHY;  Surgeon: Nigel Mormon, MD;  Location: [facility];  Service: Cardiovascular;  Laterality: N/A;   NECK SURGERY     PARTIAL COLECTOMY N/A 01/13/2018   Procedure: LAPAROSCOPIC ASSISTED   SIGMOID COLECTOMY ILEOSTOMY;  Surgeon: Clovis Riley,  MD;  [facility];  Service: General;  Laterality: N/A;   PENILE PROSTHESIS IMPLANT  08/14/05   INFRAPUBIC INSERTION OF INFLATABLE PENILE PROSTHESIS; SURGEON DR. Amalia Hailey   PENILE PROSTHESIS IMPLANT     PERCUTANEOUS CORONARY STENT INTERVENTION (PCI-S) Right 10/29/2014   Procedure: PERCUTANEOUS CORONARY STENT INTERVENTION (PCI-S);  Surgeon: Laverda Page, MD;  Location: [facility]ATH LAB;  Service: Cardiovascular;  Laterality: Right;   PERIPHERAL  VASCULAR INTERVENTION Left 06/23/2021   Procedure: PERIPHERAL VASCULAR INTERVENTION;  Surgeon: Nigel Mormon, MD;  Location: [facility];  Service: Cardiovascular;  Laterality: Left;   REMOVAL OF PENILE PROSTHESIS N/A 06/14/2021   Procedure: Removal of THREE piece inflatable penile prosthesis;  Surgeon: Lucas Mallow, MD;  Location: [facility];  Service: Urology;  Laterality: N/A;   SHOULDER ARTHROSCOPY     SPINE SURGERY     TOE AMPUTATION Left    TONSILLECTOMY     TOTAL KNEE ARTHROPLASTY  07/2002   RIGHT KNEE ; SURGEON  DR. [name] ALSO HAD ARTHROSCOPIC RIGHT KNEE IN  10/2001   TOTAL KNEE ARTHROPLASTY     ULNAR NERVE TRANSPOSITION Right 10/21/2013   Procedure: RIGHT ELBOW  ULNAR NERVE DECOMPRESSION;  Surgeon: Wynonia Sours, MD;  Location: [facility];  Service: Orthopedics;  Laterality: Right;   Social History   Occupational History   Occupation: Magazine features editor: UNEMPLOYED  Tobacco Use   Smoking status: Former    Packs/day: 2.00    Years: 35.00    Pack years: 70.00    Types: Cigarettes    Quit date: 10/28/2011    Years since quitting: 9.7   Smokeless tobacco: Never  Vaping Use   Vaping Use: Some days   Substances: Nicotine  Substance and Sexual Activity   Alcohol use: Not Currently    Comment: "not in a long time"   Drug use: Never   Sexual activity: Yes    Birth control/protection: Implant    Comment: penile implant

## 2021-07-28 ENCOUNTER — Telehealth: Payer: Self-pay | Admitting: Orthopedic Surgery

## 2021-07-28 NOTE — Telephone Encounter (Signed)
Patient called he says he needs an appointment for Monday 11/21. Would like a call back with an appointment time. [phone]

## 2021-07-28 NOTE — Telephone Encounter (Signed)
I called pt and made his appts for the next three weeks.

## 2021-07-31 ENCOUNTER — Other Ambulatory Visit: Payer: Self-pay

## 2021-07-31 ENCOUNTER — Ambulatory Visit (INDEPENDENT_AMBULATORY_CARE_PROVIDER_SITE_OTHER): Payer: 59 | Admitting: Orthopedic Surgery

## 2021-07-31 DIAGNOSIS — I87332 Chronic venous hypertension (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: Secondary | ICD-10-CM

## 2021-07-31 DIAGNOSIS — I739 Peripheral vascular disease, unspecified: Secondary | ICD-10-CM | POA: Diagnosis not present

## 2021-07-31 DIAGNOSIS — L97521 Non-pressure chronic ulcer of other part of left foot limited to breakdown of skin: Secondary | ICD-10-CM | POA: Diagnosis not present

## 2021-07-31 DIAGNOSIS — L97929 Non-pressure chronic ulcer of unspecified part of left lower leg with unspecified severity: Secondary | ICD-10-CM

## 2021-08-01 ENCOUNTER — Encounter: Payer: Self-pay | Admitting: Orthopedic Surgery

## 2021-08-01 NOTE — Progress Notes (Signed)
Office Visit Note   Patient: [name]           Date of Birth: 1956-03-22           MRN: [id number] Visit Date: 07/31/2021              Requested by: [name], PA-C [address] [place],  [place] [address] PCP: [name], PA-C  Chief Complaint  Patient presents with   Left Leg - Wound Check, Follow-up      HPI: Patient is a 65 year old gentleman status post right transtibial amputation status post revascularization to the left lower extremity with an ischemic ulcer over the base of the fifth metatarsal.  Patient also has venous insufficiency and has been undergoing serial compression wraps.  Assessment & Plan: Visit Diagnoses:  1. Chronic venous hypertension (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 (HCC)   2. Ischemic ulcer of left foot, limited to breakdown of skin ([place])   3. PVD (peripheral vascular disease) ([place])     Plan: Patient was given a prescription for a new liner for his prosthesis on the right with [employer].  A new 3 layer compression wrap was applied with silver alginate over the wound.  Follow-Up Instructions: Return in about 1 week (around 08/07/2021).   Ortho Exam  Patient is alert, oriented, no adenopathy, well-dressed, normal affect, normal respiratory effort. Examination there is decreased swelling with the venous insufficiency and good wrinkling of the skin.  The ischemic ulcer seems more ischemic at this time there was significant improvement immediately after his vascular procedure but the improvement seems to have decreased.  There is fibrinous tissue in the base of the wound this does not probe to bone.  There is no cellulitis.  Imaging: No results found. No images are attached to the encounter.  Labs: Lab Results  Component Value Date   HGBA1C 7.9 (H) 06/14/2021   HGBA1C 5.7 (H) 09/16/2018   HGBA1C 7.7 (H) 05/15/2018   ESRSEDRATE 17 (H) 06/24/2021   ESRSEDRATE 35 (H) 09/03/2016   ESRSEDRATE 111 (H)  05/11/2016   CRP 0.6 06/24/2021   CRP 1.2 (H) 09/03/2016   CRP 4.7 (H) 05/11/2016   REPTSTATUS 06/22/2021 FINAL 06/21/2021   GRAMSTAIN  06/14/2021    NO ORGANISMS SEEN SQUAMOUS EPITHELIAL CELLS PRESENT ABUNDANT WBC PRESENT,BOTH PMN AND MONONUCLEAR MODERATE GRAM POSITIVE COCCI    CULT  06/21/2021    NO GROWTH Performed at [facility], [place] [address] [place], [place] [address]    LABORGA ENTEROCOCCUS FAECALIS 06/14/2021     Lab Results  Component Value Date   ALBUMIN 2.5 (L) 06/21/2021   ALBUMIN 2.0 (L) 06/17/2021   ALBUMIN 2.7 (L) 06/13/2021   PREALBUMIN 19.0 05/04/2016   PREALBUMIN 25.0 12/16/2015    Lab Results  Component Value Date   MG 1.9 05/24/2018   MG 2.1 05/23/2018   MG 1.3 (L) 05/22/2018   No results found for: VD25OH  Lab Results  Component Value Date   PREALBUMIN 19.0 05/04/2016   PREALBUMIN 25.0 12/16/2015   CBC EXTENDED Latest Ref Rng & Units 06/29/2021 06/28/2021 06/27/2021  WBC 4.0 - 10.5 K/uL 7.8 6.9 9.4  RBC 4.22 - 5.81 MIL/uL 3.05(L) 3.24(L) 3.13(L)  HGB 13.0 - 17.0 g/dL 9.5(L) 10.0(L) 9.5(L)  HCT 39.0 - 52.0 % 29.1(L) 30.6(L) 30.4(L)  PLT 150 - 400 K/uL 132(L) 144(L) 164  NEUTROABS 1.7 - 7.7 K/uL - - -  LYMPHSABS 0.7 - 4.0 K/uL - - -  There is no height or weight on file to calculate BMI.  Orders:  No orders of the defined types were placed in this encounter.  No orders of the defined types were placed in this encounter.    Procedures: No procedures performed  Clinical Data: No additional findings.  ROS:  All other systems negative, except as noted in the HPI. Review of Systems  Objective: Vital Signs: There were no vitals taken for this visit.  Specialty Comments:  No specialty comments available.  PMFS History: Patient Active Problem List   Diagnosis Date Noted   Severe sepsis with lactic acidosis ([place]) 06/14/2021   Infection associated with implanted penile prosthesis ([place]) 06/14/2021   ESRD on  hemodialysis ([place]) 06/14/2021   Paroxysmal atrial fibrillation with RVR ([place]) 06/14/2021   Carotid stenosis 06/15/2020   Dyslipidemia 06/15/2020   Severe nonproliferative diabetic retinopathy of right eye, with macular edema, associated with type 2 diabetes mellitus ([place]) 01/18/2020   Severe nonproliferative diabetic retinopathy of left eye, with macular edema, associated with type 2 diabetes mellitus ([place]) 01/04/2020   Retinal hemorrhage of right eye 01/04/2020   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   Pseudoclaudication 11/15/2018   Essential hypertension 09/18/2018   S/P BKA (below knee amputation), right ([place]) 09/18/2018   Amputation of toe of left foot ([place]) 09/18/2018   Peripheral artery disease ([place]) 09/18/2018   S/P carotid endarterectomy 09/18/2018   OSA on CPAP 09/18/2018   Chronic back pain 09/18/2018   Status post reversal of ileostomy 05/21/2018   Normocytic anemia 02/14/2018   Intra-abdominal abscess ([place]) 11/04/2017   Chronic venous hypertension (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 ([place]) 12/11/2016   Ischemic ulcer of left foot, limited to breakdown of skin ([place]) 11/12/2016   Unilateral primary osteoarthritis, left knee 10/11/2016   History of right below knee amputation ([place]) 07/12/2016   Diabetic foot infection ([place]) 05/04/2016   Insulin dependent type 2 diabetes mellitus ([place]) 12/16/2015   Amputated toe ([place]) 10/21/2015   Leg edema, left 09/03/2015   CAD (dz of distal, mid and proximal RCA with implantation of 3 overlapping drug-eluting stent,) 09/03/2015   Chronic diastolic heart failure, NYHA class 2 ([place]) 09/03/2015   Angina pectoris associated with type 2 diabetes mellitus ([place]) 10/28/2014   Hyperlipidemia 08/25/2014   Limb pain 03/20/2013   DJD (degenerative joint disease) 09/25/2012   Migraine 09/25/2012   Neuropathy 09/25/2012   Restless legs syndrome (RLS) 09/25/2012   Chronic obstructive pulmonary disease, unspecified ([place])  04/25/2012   Unknown cause of morbidity or mortality 04/25/2012   Chronic total occlusion of artery of the extremities ([place]) 04/08/2012   Onychomycosis 02/01/2012   Occlusion and stenosis of carotid artery without mention of cerebral infarction 06/12/2011   GERD (gastroesophageal reflux disease) 05/08/2011   Barrett's esophagus without dysplasia 05/08/2011   Former tobacco use 02/01/2011   Past Medical History:  Diagnosis Date   Carotid artery occlusion 11/10/10   LEFT CAROTID ENDARTERECTOMY   Chronic kidney disease    Complication of anesthesia    BP WENT UP AT [facility] "   COPD (chronic obstructive pulmonary disease) ([place])    pt denies this dx as of 06/01/20 - no inhaler    Diabetes mellitus without complication ([place])    Diverticulitis    Diverticulosis of colon (without mention of hemorrhage)    DJD (degenerative joint disease)    knees/hands/feet/back/neck   Fatty liver    Full dentures    GERD (gastroesophageal reflux disease)    H/O  hiatal hernia    History of blood transfusion    with a past surical procedure per patient 06/01/20   Hyperlipidemia    Hypertension    Neuromuscular disorder ([place])    peripheral neuropathy   Non-pressure chronic ulcer of other part of left foot limited to breakdown of skin ([place]) 11/12/2016   Osteomyelitis (HCC)    left 5th metatarsal   PAD (peripheral artery disease) ([place])    Distal aortogram June 2012. Atherectomy left popliteal artery July 2012.    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 Before age 16   Hyperlipidemia Brother    Hypertension Brother    Cancer Sister        Breast   Hyperlipidemia Sister    Hypertension Sister    Hypertension Son    Arthritis Other        GRANDMOTHER   Hypertension Other        OTHER FAMILY MEMBERS    Past Surgical History:  Procedure Laterality Date   ABDOMINAL AORTOGRAM W/LOWER EXTREMITY N/A 06/23/2021   Procedure: ABDOMINAL AORTOGRAM W/LOWER EXTREMITY;  Surgeon: Nigel Mormon, MD;  Location: [facility];  Service: Cardiovascular;  Laterality: N/A;   AMPUTATION  11/05/2011   Procedure: AMPUTATION RAY;  Surgeon: Wylene Simmer, MD;  Location: [facility];  Service: Orthopedics;  Laterality: Right;  Amputation of Right 4&5th Toes   AMPUTATION Left 11/26/2012   Procedure: AMPUTATION RAY;  Surgeon: Wylene Simmer, MD;  Location: [facility];  Service: Orthopedics;  Laterality: Left;  fourth ray amputation   AMPUTATION Right 08/27/2014   Procedure: Transmetatarsal Amputation;  Surgeon: Newt Minion, MD;  Location: [facility];  Service: Orthopedics;  Laterality: Right;   AMPUTATION Right 01/14/2015   Procedure: AMPUTATION BELOW KNEE;  Surgeon: Newt Minion, MD;  Location: [facility];  Service: Orthopedics;  Laterality: Right;   AMPUTATION Left 10/21/2015   Procedure: Left Foot 5th Ray Amputation;  Surgeon: Newt Minion, MD;  Location: [facility];  Service: Orthopedics;  Laterality: Left;   ANTERIOR FUSION CERVICAL SPINE  02/06/06   C4-5, C5-6, C6-7; SURGEON DR. MAX COHEN   AV FISTULA PLACEMENT Left 06/02/2020   Procedure: ARTERIOVENOUS (AV) FISTULA CREATION LEFT;  Surgeon: Waynetta Sandy, MD;  Location: [facility];  Service: Vascular;  Laterality: Left;   BACK SURGERY     x 3   [place] Left 07/21/2020   Procedure: LEFT UPPER ARM ATERIOVENOUS SUPERFISTULALIZATION;  Surgeon: Waynetta Sandy, MD;  Location: [facility];  Service: Vascular;   Laterality: Left;   BELOW KNEE LEG AMPUTATION Right    CARDIAC CATHETERIZATION  10/31/04  2009   CAROTID ENDARTERECTOMY  11/10/10   CAROTID ENDARTERECTOMY Left 11/10/2010   Subtotal occlusion of left internal carotid artery with left hemispheric transient ischemic attacks.   CAROTID STENT     CARPAL TUNNEL RELEASE Right 10/21/2013   Procedure: RIGHT CARPAL TUNNEL RELEASE;  Surgeon: Wynonia Sours, MD;  Location: [facility];  Service: Orthopedics;  Laterality: Right;   CHOLECYSTECTOMY     COLON SURGERY     COLONOSCOPY     COLOSTOMY REVERSAL  05/21/2018   ileostomy reversal   CYSTOSCOPY WITH STENT PLACEMENT Bilateral 01/13/2018   Procedure: CYSTOSCOPY WITH BILATERAL URETERAL CATHETER PLACEMENT;  Surgeon: Ardis Hughs, MD;  [facility];  Service: Urology;  Laterality: Bilateral;   ESOPHAGEAL MANOMETRY Bilateral 07/19/2014   Procedure: ESOPHAGEAL MANOMETRY (EM);  Surgeon: Jerene Bears, MD;  [facility];  Service: Gastroenterology;  Laterality: Bilateral;   EYE SURGERY Bilateral 2020   cataract   FEMORAL ARTERY STENT     x6   FINGER SURGERY     FOOT SURGERY  04/25/2016    EXCISION BASE 5TH METATARSAL AND PARTIAL CUBOID LEFT FOOT   HERNIA REPAIR     LEFT INGUINAL AND UMBILICAL REPAIRS   HERNIA REPAIR     I & D EXTREMITY Left 04/25/2016   Procedure: EXCISION BASE 5TH METATARSAL AND PARTIAL CUBOID LEFT FOOT;  Surgeon: Newt Minion, MD;  Location: [facility];  Service: Orthopedics;  Laterality: Left;   ILEOSTOMY  01/13/2018   Procedure: ILEOSTOMY;  Surgeon: Clovis Riley, MD;  Location[facility];  Service: General;;   ILEOSTOMY CLOSURE N/A 05/21/2018   Procedure: ILEOSTOMY REVERSAL ERAS PATHWAY;  Surgeon: Clovis Riley, MD;  Location: [facility];  Service: General;  Laterality: N/A;   IR RADIOLOGIST EVAL & MGMT  11/19/2017   IR RADIOLOGIST EVAL & MGMT  12/03/2017   IR RADIOLOGIST EVAL & MGMT  12/18/2017   JOINT REPLACEMENT Right 2001   Total knee   LAMINECTOMY     X  3 LUMBAR AND X 2 CERVICAL SPINE OPERATIONS   LAPAROSCOPIC CHOLECYSTECTOMY W/ CHOLANGIOGRAPHY  11/09/04   SURGEON DR. [place] CATH AND CORONARY ANGIOGRAPHY N/A 09/16/2018   Procedure: LEFT HEART CATH AND CORONARY ANGIOGRAPHY;  Surgeon: Nigel Mormon, MD;  Location: [facility];  Service: Cardiovascular;  Laterality: N/A;   LEFT HEART CATHETERIZATION WITH CORONARY ANGIOGRAM N/A 10/29/2014   Procedure: LEFT HEART CATHETERIZATION WITH CORONARY ANGIOGRAM;  Surgeon: Laverda Page, MD;  [facility];  Service: Cardiovascular;  Laterality: N/A;   LIGATION OF COMPETING BRANCHES OF ARTERIOVENOUS FISTULA Left 07/21/2020   Procedure: LIGATION OF COMPETING BRANCHES OF LEFT UPPER ARM ARTERIOVENOUS FISTULA;  Surgeon: Waynetta Sandy, MD;  Location: [facility];  Service: Vascular;  Laterality: Left;   LOWER EXTREMITY ANGIOGRAM N/A 03/19/2012   Procedure: LOWER EXTREMITY ANGIOGRAM;  Surgeon: Burnell Blanks, MD;  Location[facility];  Service: Cardiovascular;  Laterality: N/A;   LOWER EXTREMITY ANGIOGRAPHY N/A 06/20/2021   Procedure: LOWER EXTREMITY ANGIOGRAPHY;  Surgeon: Nigel Mormon, MD;  Location: [facility];  Service: Cardiovascular;  Laterality: N/A;   NECK SURGERY     PARTIAL COLECTOMY N/A 01/13/2018   Procedure: LAPAROSCOPIC ASSISTED   SIGMOID COLECTOMY ILEOSTOMY;  Surgeon: Clovis Riley, MD;  [facility];  Service: General;  Laterality: N/A;   PENILE PROSTHESIS IMPLANT  08/14/05   INFRAPUBIC INSERTION OF INFLATABLE PENILE PROSTHESIS; SURGEON DR.  EVANS   PENILE PROSTHESIS IMPLANT     PERCUTANEOUS CORONARY STENT INTERVENTION (PCI-S) Right 10/29/2014   Procedure: PERCUTANEOUS CORONARY STENT INTERVENTION (PCI-S);  Surgeon: Laverda Page, MD;  Location[facility];  Service: Cardiovascular;  Laterality: Right;   PERIPHERAL VASCULAR INTERVENTION Left 06/23/2021   Procedure: PERIPHERAL VASCULAR INTERVENTION;  Surgeon: Nigel Mormon, MD;  Location: [facility];  Service: Cardiovascular;  Laterality: Left;   REMOVAL OF PENILE PROSTHESIS N/A 06/14/2021   Procedure: Removal of THREE piece inflatable penile prosthesis;  Surgeon: Lucas Mallow, MD;  Location: [facility];  Service: Urology;  Laterality: N/A;   SHOULDER ARTHROSCOPY     SPINE SURGERY     TOE AMPUTATION Left    TONSILLECTOMY     TOTAL KNEE ARTHROPLASTY  07/2002   RIGHT KNEE ; SURGEON  DR. [name] ALSO HAD ARTHROSCOPIC RIGHT KNEE IN  10/2001   TOTAL KNEE ARTHROPLASTY     ULNAR NERVE TRANSPOSITION Right 10/21/2013   Procedure: RIGHT ELBOW  ULNAR NERVE DECOMPRESSION;  Surgeon: Wynonia Sours, MD;  Location: [facility];  Service: Orthopedics;  Laterality: Right;   Social History   Occupational History   Occupation: Magazine features editor: UNEMPLOYED  Tobacco Use   Smoking status: Former    Packs/day: 2.00    Years: 35.00    Pack years: 70.00    Types: Cigarettes    Quit date: 10/28/2011    Years since quitting: 9.7   Smokeless tobacco: Never  Vaping Use   Vaping Use: Some days   Substances: Nicotine  Substance and Sexual Activity   Alcohol use: Not Currently    Comment: "not in a long time"   Drug use: Never   Sexual activity: Yes    Birth control/protection: Implant    Comment: penile implant

## 2021-08-07 ENCOUNTER — Other Ambulatory Visit: Payer: Self-pay

## 2021-08-07 ENCOUNTER — Ambulatory Visit (INDEPENDENT_AMBULATORY_CARE_PROVIDER_SITE_OTHER): Payer: 59 | Admitting: Orthopedic Surgery

## 2021-08-07 ENCOUNTER — Encounter: Payer: Self-pay | Admitting: Orthopedic Surgery

## 2021-08-07 DIAGNOSIS — I87332 Chronic venous hypertension (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: Secondary | ICD-10-CM

## 2021-08-07 DIAGNOSIS — L97929 Non-pressure chronic ulcer of unspecified part of left lower leg with unspecified severity: Secondary | ICD-10-CM | POA: Diagnosis not present

## 2021-08-07 DIAGNOSIS — L97521 Non-pressure chronic ulcer of other part of left foot limited to breakdown of skin: Secondary | ICD-10-CM | POA: Diagnosis not present

## 2021-08-07 NOTE — Progress Notes (Signed)
Office Visit Note   Patient: [name]           Date of Birth: [birth year]           MRN: [id number] Visit Date: 08/07/2021              Requested by: [name], PA-C [address] [place],  [place] [address] PCP: [name], PA-C  Chief Complaint  Patient presents with   Left Leg - Edema, Follow-up   Left Foot - Edema, Follow-up      HPI: Patient is a 65 year old gentleman presents in follow-up for venous insufficiency and an ulcer over the lateral base of the left foot he has been in compression wraps.  Assessment & Plan: Visit Diagnoses:  1. Chronic venous hypertension (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 (HCC)   2. Ischemic ulcer of left foot, limited to breakdown of skin ([place])     Plan: We will apply a 3 layer compression wrap today with silver alginate over the wound.  Follow-up on Friday for a nurse visit dressing change and follow-up myself on Monday  Follow-Up Instructions: Return in about 1 week (around 08/14/2021).   Ortho Exam  Patient is alert, oriented, no adenopathy, well-dressed, normal affect, normal respiratory effort. Examination patient does have decreased swelling of the left lower extremity there is approximately 75% healthy granulation tissue at the wound bed base of the fifth metatarsal left foot.  Ulcer measures 2 x 4 cm.  There is no ascending cellulitis.  Imaging: No results found. No images are attached to the encounter.  Labs: Lab Results  Component Value Date   HGBA1C 7.9 (H) 06/14/2021   HGBA1C 5.7 (H) 09/16/2018   HGBA1C 7.7 (H) 05/15/2018   ESRSEDRATE 17 (H) 06/24/2021   ESRSEDRATE 35 (H) 09/03/2016   ESRSEDRATE 111 (H) 05/11/2016   CRP 0.6 06/24/2021   CRP 1.2 (H) 09/03/2016   CRP 4.7 (H) 05/11/2016   REPTSTATUS 06/22/2021 FINAL 06/21/2021   GRAMSTAIN  06/14/2021    NO ORGANISMS SEEN SQUAMOUS EPITHELIAL CELLS PRESENT ABUNDANT WBC PRESENT,BOTH PMN AND MONONUCLEAR MODERATE GRAM POSITIVE  COCCI    CULT  06/21/2021    NO GROWTH Performed at [facility], [place] [address], [place], [place] [address]    LABORGA ENTEROCOCCUS FAECALIS 06/14/2021     Lab Results  Component Value Date   ALBUMIN 2.5 (L) 06/21/2021   ALBUMIN 2.0 (L) 06/17/2021   ALBUMIN 2.7 (L) 06/13/2021   PREALBUMIN 19.0 05/04/2016   PREALBUMIN 25.0 12/16/2015    Lab Results  Component Value Date   MG 1.9 05/24/2018   MG 2.1 05/23/2018   MG 1.3 (L) 05/22/2018   No results found for: VD25OH  Lab Results  Component Value Date   PREALBUMIN 19.0 05/04/2016   PREALBUMIN 25.0 12/16/2015   CBC EXTENDED Latest Ref Rng & Units 06/29/2021 06/28/2021 06/27/2021  WBC 4.0 - 10.5 K/uL 7.8 6.9 9.4  RBC 4.22 - 5.81 MIL/uL 3.05(L) 3.24(L) 3.13(L)  HGB 13.0 - 17.0 g/dL 9.5(L) 10.0(L) 9.5(L)  HCT 39.0 - 52.0 % 29.1(L) 30.6(L) 30.4(L)  PLT 150 - 400 K/uL 132(L) 144(L) 164  NEUTROABS 1.7 - 7.7 K/uL - - -  LYMPHSABS 0.7 - 4.0 K/uL - - -     There is no height or weight on file to calculate BMI.  Orders:  No orders of the defined types were placed in this encounter.  No orders of the defined types were placed in  this encounter.    Procedures: No procedures performed  Clinical Data: No additional findings.  ROS:  All other systems negative, except as noted in the HPI. Review of Systems  Objective: Vital Signs: There were no vitals taken for this visit.  Specialty Comments:  No specialty comments available.  PMFS History: Patient Active Problem List   Diagnosis Date Noted   Severe sepsis with lactic acidosis ([place]) 06/14/2021   Infection associated with implanted penile prosthesis ([place]) 06/14/2021   ESRD on hemodialysis ([place]) 06/14/2021   Paroxysmal atrial fibrillation with RVR ([place]) 06/14/2021   Carotid stenosis 06/15/2020   Dyslipidemia 06/15/2020   Severe nonproliferative diabetic retinopathy of right eye, with macular edema, associated with type 2 diabetes mellitus ([place]) 01/18/2020    Severe nonproliferative diabetic retinopathy of left eye, with macular edema, associated with type 2 diabetes mellitus ([place]) 01/04/2020   Retinal hemorrhage of right eye 01/04/2020   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   Pseudoclaudication 11/15/2018   Essential hypertension 09/18/2018   S/P BKA (below knee amputation), right ([place]) 09/18/2018   Amputation of toe of left foot ([place]) 09/18/2018   Peripheral artery disease ([place]) 09/18/2018   S/P carotid endarterectomy 09/18/2018   OSA on CPAP 09/18/2018   Chronic back pain 09/18/2018   Status post reversal of ileostomy 05/21/2018   Normocytic anemia 02/14/2018   Intra-abdominal abscess ([place]) 11/04/2017   Chronic venous hypertension (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 ([place]) 12/11/2016   Ischemic ulcer of left foot, limited to breakdown of skin ([place]) 11/12/2016   Unilateral primary osteoarthritis, left knee 10/11/2016   History of right below knee amputation ([place]) 07/12/2016   Diabetic foot infection ([place]) 05/04/2016   Insulin dependent type 2 diabetes mellitus ([place]) 12/16/2015   Amputated toe ([place]) 10/21/2015   Leg edema, left 09/03/2015   CAD (dz of distal, mid and proximal RCA with implantation of 3 overlapping drug-eluting stent,) 09/03/2015   Chronic diastolic heart failure, NYHA class 2 ([place]) 09/03/2015   Angina pectoris associated with type 2 diabetes mellitus ([place]) 10/28/2014   Hyperlipidemia 08/25/2014   Limb pain 03/20/2013   DJD (degenerative joint disease) 09/25/2012   Migraine 09/25/2012   Neuropathy 09/25/2012   Restless legs syndrome (RLS) 09/25/2012   Chronic obstructive pulmonary disease, unspecified ([place]) 04/25/2012   Unknown cause of morbidity or mortality 04/25/2012   Chronic total occlusion of artery of the extremities ([place]) 04/08/2012   Onychomycosis 02/01/2012   Occlusion and stenosis of carotid artery without mention of cerebral infarction 06/12/2011   GERD (gastroesophageal reflux  disease) 05/08/2011   Barrett's esophagus without dysplasia 05/08/2011   Former tobacco use 02/01/2011   Past Medical History:  Diagnosis Date   Carotid artery occlusion 11/10/10   LEFT CAROTID ENDARTERECTOMY   Chronic kidney disease    Complication of anesthesia    BP WENT UP AT [facility] "   COPD (chronic obstructive pulmonary disease) ([place])    pt denies this dx as of 06/01/20 - no inhaler    Diabetes mellitus without complication ([place])    Diverticulitis    Diverticulosis of colon (without mention of hemorrhage)    DJD (degenerative joint disease)    knees/hands/feet/back/neck   Fatty liver    Full dentures    GERD (gastroesophageal reflux disease)    H/O hiatal hernia    History of blood transfusion    with a past surical procedure per patient 06/01/20   Hyperlipidemia    Hypertension    Neuromuscular disorder ([place])  peripheral neuropathy   Non-pressure chronic ulcer of other part of left foot limited to breakdown of skin ([place]) 11/12/2016   Osteomyelitis (HCC)    left 5th metatarsal   PAD (peripheral artery disease) ([place])    Distal aortogram June 2012. Atherectomy left popliteal artery July 2012.    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 Before age 41   Hyperlipidemia Brother    Hypertension Brother    Cancer Sister        Breast   Hyperlipidemia Sister    Hypertension Sister    Hypertension Son    Arthritis Other         GRANDMOTHER   Hypertension Other        OTHER FAMILY MEMBERS    Past Surgical History:  Procedure Laterality Date   ABDOMINAL AORTOGRAM W/LOWER EXTREMITY N/A 06/23/2021   Procedure: ABDOMINAL AORTOGRAM W/LOWER EXTREMITY;  Surgeon: Nigel Mormon, MD;  Location: [facility];  Service: Cardiovascular;  Laterality: N/A;   AMPUTATION  11/05/2011   Procedure: AMPUTATION RAY;  Surgeon: Wylene Simmer, MD;  Location: [facility];  Service: Orthopedics;  Laterality: Right;  Amputation of Right 4&5th Toes   AMPUTATION Left 11/26/2012   Procedure: AMPUTATION RAY;  Surgeon: Wylene Simmer, MD;  Location: [facility];  Service: Orthopedics;  Laterality: Left;  fourth ray amputation   AMPUTATION Right 08/27/2014   Procedure: Transmetatarsal Amputation;  Surgeon: Newt Minion, MD;  Location: [facility];  Service: Orthopedics;  Laterality: Right;   AMPUTATION Right 01/14/2015   Procedure: AMPUTATION BELOW KNEE;  Surgeon: Newt Minion, MD;  Location: [facility];  Service: Orthopedics;  Laterality: Right;   AMPUTATION Left 10/21/2015   Procedure: Left Foot 5th Ray Amputation;  Surgeon: Newt Minion, MD;  Location: [facility];  Service: Orthopedics;  Laterality: Left;   ANTERIOR FUSION CERVICAL SPINE  02/06/06   C4-5, C5-6, C6-7; SURGEON DR. MAX COHEN   AV FISTULA PLACEMENT Left 06/02/2020   Procedure: ARTERIOVENOUS (AV) FISTULA CREATION LEFT;  Surgeon: Waynetta Sandy, MD;  Location: [facility];  Service: Vascular;  Laterality: Left;   BACK SURGERY     x 3   [place] Left 07/21/2020   Procedure: LEFT UPPER ARM ATERIOVENOUS SUPERFISTULALIZATION;  Surgeon: Waynetta Sandy, MD;  Location: [facility];  Service: Vascular;  Laterality: Left;   BELOW KNEE LEG AMPUTATION Right    CARDIAC CATHETERIZATION  10/31/04   2009   CAROTID ENDARTERECTOMY  11/10/10   CAROTID ENDARTERECTOMY Left 11/10/2010   Subtotal occlusion of left internal carotid artery with left hemispheric transient ischemic attacks.   CAROTID  STENT  CARPAL TUNNEL RELEASE Right 10/21/2013   Procedure: RIGHT CARPAL TUNNEL RELEASE;  Surgeon: Wynonia Sours, MD;  Location: [facility];  Service: Orthopedics;  Laterality: Right;   CHOLECYSTECTOMY     COLON SURGERY     COLONOSCOPY     COLOSTOMY REVERSAL  05/21/2018   ileostomy reversal   CYSTOSCOPY WITH STENT PLACEMENT Bilateral 01/13/2018   Procedure: CYSTOSCOPY WITH BILATERAL URETERAL CATHETER PLACEMENT;  Surgeon: Ardis Hughs, MD;  Location: [facility];  Service: Urology;  Laterality: Bilateral;   ESOPHAGEAL MANOMETRY Bilateral 07/19/2014   Procedure: ESOPHAGEAL MANOMETRY (EM);  Surgeon: Jerene Bears, MD;  [facility];  Service: Gastroenterology;  Laterality: Bilateral;   EYE SURGERY Bilateral 2020   cataract   FEMORAL ARTERY STENT     x6   FINGER SURGERY     FOOT SURGERY  04/25/2016    EXCISION BASE 5TH METATARSAL AND PARTIAL CUBOID LEFT FOOT   HERNIA REPAIR     LEFT INGUINAL AND UMBILICAL REPAIRS   HERNIA REPAIR     I & D EXTREMITY Left 04/25/2016   Procedure: EXCISION BASE 5TH METATARSAL AND PARTIAL CUBOID LEFT FOOT;  Surgeon: Newt Minion, MD;  Location: [facility];  Service: Orthopedics;  Laterality: Left;   ILEOSTOMY  01/13/2018   Procedure: ILEOSTOMY;  Surgeon: Clovis Riley, MD;  Location[facility];  Service: General;;   ILEOSTOMY CLOSURE N/A 05/21/2018   Procedure: ILEOSTOMY REVERSAL ERAS PATHWAY;  Surgeon: Clovis Riley, MD;  Location: [facility];  Service: General;  Laterality: N/A;   IR RADIOLOGIST EVAL & MGMT  11/19/2017   IR RADIOLOGIST EVAL & MGMT  12/03/2017   IR RADIOLOGIST EVAL & MGMT  12/18/2017   JOINT REPLACEMENT Right 2001   Total knee   LAMINECTOMY     X 3 LUMBAR AND X 2 CERVICAL SPINE OPERATIONS   LAPAROSCOPIC CHOLECYSTECTOMY W/ CHOLANGIOGRAPHY  11/09/04   SURGEON DR. [place] CATH AND CORONARY ANGIOGRAPHY N/A 09/16/2018   Procedure: LEFT HEART CATH AND CORONARY ANGIOGRAPHY;  Surgeon: Nigel Mormon, MD;   Location: [facility];  Service: Cardiovascular;  Laterality: N/A;   LEFT HEART CATHETERIZATION WITH CORONARY ANGIOGRAM N/A 10/29/2014   Procedure: LEFT HEART CATHETERIZATION WITH CORONARY ANGIOGRAM;  Surgeon: Laverda Page, MD;  Location: [facility];  Service: Cardiovascular;  Laterality: N/A;   LIGATION OF COMPETING BRANCHES OF ARTERIOVENOUS FISTULA Left 07/21/2020   Procedure: LIGATION OF COMPETING BRANCHES OF LEFT UPPER ARM ARTERIOVENOUS FISTULA;  Surgeon: Waynetta Sandy, MD;  Location: [facility];  Service: Vascular;  Laterality: Left;   LOWER EXTREMITY ANGIOGRAM N/A 03/19/2012   Procedure: LOWER EXTREMITY ANGIOGRAM;  Surgeon: Burnell Blanks, MD;  Location: [facility];  Service: Cardiovascular;  Laterality: N/A;   LOWER EXTREMITY ANGIOGRAPHY N/A 06/20/2021   Procedure: LOWER EXTREMITY ANGIOGRAPHY;  Surgeon: Nigel Mormon, MD;  Location: [facility];  Service: Cardiovascular;  Laterality: N/A;   NECK SURGERY     PARTIAL COLECTOMY N/A 01/13/2018   Procedure: LAPAROSCOPIC ASSISTED   SIGMOID COLECTOMY ILEOSTOMY;  Surgeon: Clovis Riley, MD;  [facility];  Service: General;  Laterality: N/A;   PENILE PROSTHESIS IMPLANT  08/14/05   INFRAPUBIC INSERTION OF INFLATABLE PENILE PROSTHESIS; SURGEON DR. Amalia Hailey   PENILE PROSTHESIS IMPLANT     PERCUTANEOUS CORONARY STENT INTERVENTION (PCI-S) Right 10/29/2014   Procedure: PERCUTANEOUS CORONARY STENT INTERVENTION (PCI-S);  Surgeon: Laverda Page, MD;  Location: [facility];  Service: Cardiovascular;  Laterality: Right;   PERIPHERAL VASCULAR INTERVENTION Left 06/23/2021   Procedure: PERIPHERAL VASCULAR INTERVENTION;  Surgeon: Nigel Mormon, MD;  Location: [facility];  Service: Cardiovascular;  Laterality: Left;   REMOVAL OF PENILE PROSTHESIS N/A 06/14/2021   Procedure: Removal of THREE piece inflatable penile prosthesis;  Surgeon: Lucas Mallow, MD;  Location: [facility];  Service: Urology;   Laterality: N/A;   SHOULDER ARTHROSCOPY     SPINE SURGERY     TOE AMPUTATION Left    TONSILLECTOMY     TOTAL KNEE ARTHROPLASTY  07/2002   RIGHT KNEE ; SURGEON  DR. [name] ALSO HAD ARTHROSCOPIC RIGHT KNEE IN  10/2001   TOTAL KNEE ARTHROPLASTY     ULNAR NERVE TRANSPOSITION Right 10/21/2013   Procedure: RIGHT ELBOW  ULNAR NERVE DECOMPRESSION;  Surgeon: Wynonia Sours, MD;  Location: [facility];  Service: Orthopedics;  Laterality: Right;   Social History   Occupational History   Occupation: Magazine features editor: UNEMPLOYED  Tobacco Use   Smoking status: Former    Packs/day: 2.00    Years: 35.00    Pack years: 70.00    Types: Cigarettes    Quit date: 10/28/2011    Years since quitting: 9.7   Smokeless tobacco: Never  Vaping Use   Vaping Use: Some days   Substances: Nicotine  Substance and Sexual Activity   Alcohol use: Not Currently    Comment: "not in a long time"   Drug use: Never   Sexual activity: Yes    Birth control/protection: Implant    Comment: penile implant

## 2021-08-11 ENCOUNTER — Other Ambulatory Visit: Payer: Medicare Other | Admitting: Student

## 2021-08-11 ENCOUNTER — Ambulatory Visit: Payer: 59

## 2021-08-11 ENCOUNTER — Other Ambulatory Visit: Payer: Self-pay

## 2021-08-11 DIAGNOSIS — S31829D Unspecified open wound of left buttock, subsequent encounter: Secondary | ICD-10-CM

## 2021-08-11 DIAGNOSIS — N186 End stage renal disease: Secondary | ICD-10-CM

## 2021-08-11 DIAGNOSIS — L97521 Non-pressure chronic ulcer of other part of left foot limited to breakdown of skin: Secondary | ICD-10-CM

## 2021-08-11 DIAGNOSIS — Z515 Encounter for palliative care: Secondary | ICD-10-CM

## 2021-08-11 DIAGNOSIS — G8929 Other chronic pain: Secondary | ICD-10-CM

## 2021-08-11 NOTE — Progress Notes (Signed)
[facility] Consult Note Telephone: [phone]  Fax: [phone]    Date of encounter: 08/11/21 3:26 PM PATIENT NAME: [name] [address] [place] [address]   [phone] (home)  DOB: [birth year] MRN: [id number] PRIMARY CARE PROVIDER:    Fae [name],  [place] [address] [phone]  REFERRING PROVIDER:   [name], PA-C [place],  [place] [address] [phone]  RESPONSIBLE PARTY:    Contact Information     Name Relation Home Work Mobile   [name],Harold Hat[place] Spouse  [phone]   [name], Harold   [phone]        Due to the COVID-19 crisis, this visit was done via telemedicine from my office and it was initiated and consent by this patient and or family.  I connected with  [name] on 08/11/21 by a video enabled telemedicine application and verified that I am speaking with the correct person using two identifiers.   I discussed the limitations of evaluation and management by telemedicine. The patient expressed understanding and agreed to proceed.                                   ASSESSMENT AND PLAN / RECOMMENDATIONS:   Advance Care Planning/Goals of Care: Goals include to maximize quality of life and symptom management. Our advance care planning conversation included a discussion about:    The value and importance of advance care planning  Experiences with loved ones who have been seriously ill or have died  Exploration of personal, cultural or spiritual beliefs that might influence medical decisions  Exploration of goals of care in the event of a sudden injury or illness  CODE STATUS: Full Code  Symptom Management/Plan:  ESRD-continue HD on Tuesday/Thursday/Saturday as directed.   Pain-chronic pain. Continue fentanyl 50 mcg every 72 hours, percocet TID PRN, Lyrica as directed. Palliative Medicine will refill Duragesic and  percocet; scripts to be sent to [facility] on [address].  Left foot wound-chronic vascular wound; continue wound care in office on Monday and Friday's as scheduled. Follow up with [name] as scheduled.   Left buttocks wound-continue foam dressing; change every 3 days or PRN. Turn and reposition every 2 hours. Will have palliative nurse check on ordering dressing supplies.     Follow up Palliative Care Visit: Palliative care will continue to follow for complex medical decision making, advance care planning, and clarification of goals. Return in 4-6 weeks or prn.  I spent 40 minutes providing this consultation. More than 50% of the time in this consultation was spent in counseling and care coordination.   PPS: 40%  HOSPICE ELIGIBILITY/DIAGNOSIS: TBD  Chief Complaint: Palliative Medicine follow up visit.   HISTORY OF PRESENT ILLNESS:  [name] is a 65 y.o. year old male  with of ESRD on hemodialysis T-TH-Saturday, chronic diastolic CHF, paroxysmal atrial fibrillation, type 2 diabetes, hypertension, hyperlipidemia, CAD, hx of PCI and stent, severe PAD, right BKA with multiple toes amputated on the left foot, chronic left vascular wound, chronic pain, sinus bradycardia, anemia.  Patient states he is tolerating HD; occasionally his blood pressure drops. He states his pain is managed with current regimen of Duragesic 50 mcg, percocet TID PRN, Lyrica 100 mg daily. 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 He states his pain is manageable at a 4-5; worse pain is a 9.  He states he is not ambulating, but is able to stand and transfer. He was unable to receive [place] therapy due to insurance issues. He is currently receiving wound care left foot vascular wound every Monday and Friday. Wife is apply foam dressing to buttocks wounds. He endorses a good appetite. He is sleeping well. No recent falls or injury. A 10-point review of systems is negative, except for the pertinent positives and negatives detailed  in the HPI.    History obtained from review of EMR, discussion with primary team, and interview with family, facility staff/caregiver and/or [name].  I reviewed available labs, medications, imaging, studies and related documents from the EMR.  Records reviewed and summarized above.   Physical Exam:  Constitutional: NAD General: frail appearing, WNWD  EYES: anicteric sclera, lids intact, no discharge  ENMT: intact hearing, oral mucous membranes moist, dentition intact Pulmonary: no increased work of breathing, no cough, room air GU: deferred MSK: non ambulatory Skin: warm and dry, no rashes or wounds on visible skin Neuro: generalized weakness, A & O x 3 Psych: non-anxious affect, pleasant Hem/lymph/immuno: no widespread bruising   Thank you for the opportunity to participate in the care of [name].  The palliative care team will continue to follow. Please call our office at [phone] if we can be of additional assistance.   Harold Sl[place], NP   COVID-19 PATIENT SCREENING TOOL Asked and negative response unless otherwise noted:   Have you had symptoms of covid, tested positive or been in contact with someone with symptoms/positive test in the past 5-10 days? No

## 2021-08-14 ENCOUNTER — Ambulatory Visit: Payer: 59 | Admitting: Student

## 2021-08-14 ENCOUNTER — Ambulatory Visit (INDEPENDENT_AMBULATORY_CARE_PROVIDER_SITE_OTHER): Payer: 59 | Admitting: Orthopedic Surgery

## 2021-08-14 ENCOUNTER — Ambulatory Visit: Payer: 59 | Admitting: Cardiology

## 2021-08-14 ENCOUNTER — Ambulatory Visit: Payer: 59 | Admitting: Orthopedic Surgery

## 2021-08-14 DIAGNOSIS — I739 Peripheral vascular disease, unspecified: Secondary | ICD-10-CM | POA: Diagnosis not present

## 2021-08-14 DIAGNOSIS — L97929 Non-pressure chronic ulcer of unspecified part of left lower leg with unspecified severity: Secondary | ICD-10-CM

## 2021-08-14 DIAGNOSIS — I87332 Chronic venous hypertension (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: Secondary | ICD-10-CM | POA: Diagnosis not present

## 2021-08-14 DIAGNOSIS — L97521 Non-pressure chronic ulcer of other part of left foot limited to breakdown of skin: Secondary | ICD-10-CM

## 2021-08-15 ENCOUNTER — Encounter: Payer: Self-pay | Admitting: Orthopedic Surgery

## 2021-08-15 NOTE — Progress Notes (Signed)
Office Visit Note   Patient: [name]           Date of Birth: Apr 27, 1956           MRN: [id number] Visit Date: 08/14/2021              Requested by: [name], PA-C [address] [place],  [place] [address] PCP: [name], PA-C  No chief complaint on file.     HPI: Patient is a 65 year old gentleman who presents in follow-up for venous insufficiency ulcers left leg as well as a ischemic ulcer base of the fifth metatarsal left foot.  He is currently on doxycycline.  Patient states he still has drainage through the dressing even though the dressing is changed twice a week.  Assessment & Plan: Visit Diagnoses:  1. Chronic venous hypertension (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 (HCC)   2. Ischemic ulcer of left foot, limited to breakdown of skin ([place])   3. PVD (peripheral vascular disease) ([place])     Plan: The left lower extremity was wrapped with a 3 layer compression wrap.  Plan to follow-up twice a week.  Follow-Up Instructions: Return in about 1 week (around 08/21/2021).   Ortho Exam  Patient is alert, oriented, no adenopathy, well-dressed, normal affect, normal respiratory effort. Examination the ulcer over the base of the fifth metatarsal measures 2 x 4 cm there is improved granulation tissue approximately 50% fibrinous exudative tissue.  The ulcers in the leg and the swelling in the leg are resolving there is no drainage from the leg.  Drainage has been coming from the foot wound.  Imaging: No results found. No images are attached to the encounter.  Labs: Lab Results  Component Value Date   HGBA1C 7.9 (H) 06/14/2021   HGBA1C 5.7 (H) 09/16/2018   HGBA1C 7.7 (H) 05/15/2018   ESRSEDRATE 17 (H) 06/24/2021   ESRSEDRATE 35 (H) 09/03/2016   ESRSEDRATE 111 (H) 05/11/2016   CRP 0.6 06/24/2021   CRP 1.2 (H) 09/03/2016   CRP 4.7 (H) 05/11/2016   REPTSTATUS 06/22/2021 FINAL 06/21/2021   GRAMSTAIN  06/14/2021    NO ORGANISMS SEEN  SQUAMOUS EPITHELIAL CELLS PRESENT ABUNDANT WBC PRESENT,BOTH PMN AND MONONUCLEAR MODERATE GRAM POSITIVE COCCI    CULT  06/21/2021    NO GROWTH Performed at [facility], [place] [address], [place], [place] [address]    LABORGA ENTEROCOCCUS FAECALIS 06/14/2021     Lab Results  Component Value Date   ALBUMIN 2.5 (L) 06/21/2021   ALBUMIN 2.0 (L) 06/17/2021   ALBUMIN 2.7 (L) 06/13/2021   PREALBUMIN 19.0 05/04/2016   PREALBUMIN 25.0 12/16/2015    Lab Results  Component Value Date   MG 1.9 05/24/2018   MG 2.1 05/23/2018   MG 1.3 (L) 05/22/2018   No results found for: VD25OH  Lab Results  Component Value Date   PREALBUMIN 19.0 05/04/2016   PREALBUMIN 25.0 12/16/2015   CBC EXTENDED Latest Ref Rng & Units 06/29/2021 06/28/2021 06/27/2021  WBC 4.0 - 10.5 K/uL 7.8 6.9 9.4  RBC 4.22 - 5.81 MIL/uL 3.05(L) 3.24(L) 3.13(L)  HGB 13.0 - 17.0 g/dL 9.5(L) 10.0(L) 9.5(L)  HCT 39.0 - 52.0 % 29.1(L) 30.6(L) 30.4(L)  PLT 150 - 400 K/uL 132(L) 144(L) 164  NEUTROABS 1.7 - 7.7 K/uL - - -  LYMPHSABS 0.7 - 4.0 K/uL - - -     There is no height or weight on file to calculate BMI.  Orders:  No orders  of the defined types were placed in this encounter.  No orders of the defined types were placed in this encounter.    Procedures: No procedures performed  Clinical Data: No additional findings.  ROS:  All other systems negative, except as noted in the HPI. Review of Systems  Objective: Vital Signs: There were no vitals taken for this visit.  Specialty Comments:  No specialty comments available.  PMFS History: Patient Active Problem List   Diagnosis Date Noted   Severe sepsis with lactic acidosis ([place]) 06/14/2021   Infection associated with implanted penile prosthesis ([place]) 06/14/2021   ESRD on hemodialysis ([place]) 06/14/2021   Paroxysmal atrial fibrillation with RVR ([place]) 06/14/2021   Carotid stenosis 06/15/2020   Dyslipidemia 06/15/2020   Severe nonproliferative diabetic  retinopathy of right eye, with macular edema, associated with type 2 diabetes mellitus ([place]) 01/18/2020   Severe nonproliferative diabetic retinopathy of left eye, with macular edema, associated with type 2 diabetes mellitus ([place]) 01/04/2020   Retinal hemorrhage of right eye 01/04/2020   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   Pseudoclaudication 11/15/2018   Essential hypertension 09/18/2018   S/P BKA (below knee amputation), right ([place]) 09/18/2018   Amputation of toe of left foot ([place]) 09/18/2018   Peripheral artery disease ([place]) 09/18/2018   S/P carotid endarterectomy 09/18/2018   OSA on CPAP 09/18/2018   Chronic back pain 09/18/2018   Status post reversal of ileostomy 05/21/2018   Normocytic anemia 02/14/2018   Intra-abdominal abscess ([place]) 11/04/2017   Chronic venous hypertension (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 ([place]) 12/11/2016   Ischemic ulcer of left foot, limited to breakdown of skin ([place]) 11/12/2016   Unilateral primary osteoarthritis, left knee 10/11/2016   History of right below knee amputation ([place]) 07/12/2016   Diabetic foot infection ([place]) 05/04/2016   Insulin dependent type 2 diabetes mellitus ([place]) 12/16/2015   Amputated toe ([place]) 10/21/2015   Leg edema, left 09/03/2015   CAD (dz of distal, mid and proximal RCA with implantation of 3 overlapping drug-eluting stent,) 09/03/2015   Chronic diastolic heart failure, NYHA class 2 ([place]) 09/03/2015   Angina pectoris associated with type 2 diabetes mellitus ([place]) 10/28/2014   Hyperlipidemia 08/25/2014   Limb pain 03/20/2013   DJD (degenerative joint disease) 09/25/2012   Migraine 09/25/2012   Neuropathy 09/25/2012   Restless legs syndrome (RLS) 09/25/2012   Chronic obstructive pulmonary disease, unspecified ([place]) 04/25/2012   Unknown cause of morbidity or mortality 04/25/2012   Chronic total occlusion of artery of the extremities ([place]) 04/08/2012   Onychomycosis 02/01/2012   Occlusion and stenosis  of carotid artery without mention of cerebral infarction 06/12/2011   GERD (gastroesophageal reflux disease) 05/08/2011   Barrett's esophagus without dysplasia 05/08/2011   Former tobacco use 02/01/2011   Past Medical History:  Diagnosis Date   Carotid artery occlusion 11/10/10   LEFT CAROTID ENDARTERECTOMY   Chronic kidney disease    Complication of anesthesia    BP WENT UP AT [facility] "   COPD (chronic obstructive pulmonary disease) ([place])    pt denies this dx as of 06/01/20 - no inhaler    Diabetes mellitus without complication ([place])    Diverticulitis    Diverticulosis of colon (without mention of hemorrhage)    DJD (degenerative joint disease)    knees/hands/feet/back/neck   Fatty liver    Full dentures    GERD (gastroesophageal reflux disease)    H/O hiatal hernia    History of blood transfusion    with a past surical  procedure per patient 06/01/20   Hyperlipidemia    Hypertension    Neuromuscular disorder ([place])    peripheral neuropathy   Non-pressure chronic ulcer of other part of left foot limited to breakdown of skin ([place]) 11/12/2016   Osteomyelitis (HCC)    left 5th metatarsal   PAD (peripheral artery disease) ([place])    Distal aortogram June 2012. Atherectomy left popliteal artery July 2012.    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 Before age 23   Hyperlipidemia Brother    Hypertension Brother    Cancer Sister        Breast    Hyperlipidemia Sister    Hypertension Sister    Hypertension Son    Arthritis Other        GRANDMOTHER   Hypertension Other        OTHER FAMILY MEMBERS    Past Surgical History:  Procedure Laterality Date   ABDOMINAL AORTOGRAM W/LOWER EXTREMITY N/A 06/23/2021   Procedure: ABDOMINAL AORTOGRAM W/LOWER EXTREMITY;  Surgeon: Nigel Mormon, MD;  Location: [facility];  Service: Cardiovascular;  Laterality: N/A;   AMPUTATION  11/05/2011   Procedure: AMPUTATION RAY;  Surgeon: Wylene Simmer, MD;  Location: [facility];  Service: Orthopedics;  Laterality: Right;  Amputation of Right 4&5th Toes   AMPUTATION Left 11/26/2012   Procedure: AMPUTATION RAY;  Surgeon: Wylene Simmer, MD;  Location: [facility];  Service: Orthopedics;  Laterality: Left;  fourth ray amputation   AMPUTATION Right 08/27/2014   Procedure: Transmetatarsal Amputation;  Surgeon: Newt Minion, MD;  Location: [facility];  Service: Orthopedics;  Laterality: Right;   AMPUTATION Right 01/14/2015   Procedure: AMPUTATION BELOW KNEE;  Surgeon: Newt Minion, MD;  Location: [facility];  Service: Orthopedics;  Laterality: Right;   AMPUTATION Left 10/21/2015   Procedure: Left Foot 5th Ray Amputation;  Surgeon: Newt Minion, MD;  Location: [facility];  Service: Orthopedics;  Laterality: Left;   ANTERIOR FUSION CERVICAL SPINE  02/06/06   C4-5, C5-6, C6-7; SURGEON DR. MAX COHEN   AV FISTULA PLACEMENT Left 06/02/2020   Procedure: ARTERIOVENOUS (AV) FISTULA CREATION LEFT;  Surgeon: Waynetta Sandy, MD;  Location: [facility];  Service: Vascular;  Laterality: Left;   BACK SURGERY     x 3   [place] Left 07/21/2020   Procedure: LEFT UPPER ARM ATERIOVENOUS SUPERFISTULALIZATION;  Surgeon: Waynetta Sandy, MD;  Location: [facility];  Service: Vascular;  Laterality: Left;   BELOW KNEE LEG AMPUTATION Right    CARDIAC CATHETERIZATION  10/31/04   2009   CAROTID ENDARTERECTOMY  11/10/10   CAROTID ENDARTERECTOMY Left 11/10/2010   Subtotal occlusion  of left internal carotid artery with left hemispheric transient ischemic attacks.   CAROTID STENT     CARPAL TUNNEL RELEASE Right 10/21/2013   Procedure: RIGHT CARPAL TUNNEL RELEASE;  Surgeon: Wynonia Sours, MD;  Location: [facility];  Service: Orthopedics;  Laterality: Right;   CHOLECYSTECTOMY     COLON SURGERY     COLONOSCOPY     COLOSTOMY REVERSAL  05/21/2018   ileostomy reversal   CYSTOSCOPY WITH STENT PLACEMENT Bilateral 01/13/2018   Procedure: CYSTOSCOPY WITH BILATERAL URETERAL CATHETER PLACEMENT;  Surgeon: Ardis Hughs, MD;  Location[facility];  Service: Urology;  Laterality: Bilateral;   ESOPHAGEAL MANOMETRY Bilateral 07/19/2014   Procedure: ESOPHAGEAL MANOMETRY (EM);  Surgeon: Jerene Bears, MD;  [facility];  Service: Gastroenterology;  Laterality: Bilateral;   EYE SURGERY Bilateral 2020   cataract   FEMORAL ARTERY STENT     x6   FINGER SURGERY     FOOT SURGERY  04/25/2016    EXCISION BASE 5TH METATARSAL AND PARTIAL CUBOID LEFT FOOT   HERNIA REPAIR     LEFT INGUINAL AND UMBILICAL REPAIRS   HERNIA REPAIR     I & D EXTREMITY Left 04/25/2016   Procedure: EXCISION BASE 5TH METATARSAL AND PARTIAL CUBOID LEFT FOOT;  Surgeon: Newt Minion, MD;  Location: [facility];  Service: Orthopedics;  Laterality: Left;   ILEOSTOMY  01/13/2018   Procedure: ILEOSTOMY;  Surgeon: Clovis Riley, MD;  Location[facility];  Service: General;;   ILEOSTOMY CLOSURE N/A 05/21/2018   Procedure: ILEOSTOMY REVERSAL ERAS PATHWAY;  Surgeon: Clovis Riley, MD;  Location: [facility];  Service: General;  Laterality: N/A;   IR RADIOLOGIST EVAL & MGMT  11/19/2017   IR RADIOLOGIST EVAL & MGMT  12/03/2017   IR RADIOLOGIST EVAL & MGMT  12/18/2017   JOINT REPLACEMENT Right 2001   Total knee   LAMINECTOMY     X 3 LUMBAR AND X 2 CERVICAL SPINE OPERATIONS   LAPAROSCOPIC CHOLECYSTECTOMY W/ CHOLANGIOGRAPHY  11/09/04   SURGEON DR. [place] CATH AND CORONARY ANGIOGRAPHY N/A 09/16/2018    Procedure: LEFT HEART CATH AND CORONARY ANGIOGRAPHY;  Surgeon: Nigel Mormon, MD;  Location: [facility];  Service: Cardiovascular;  Laterality: N/A;   LEFT HEART CATHETERIZATION WITH CORONARY ANGIOGRAM N/A 10/29/2014   Procedure: LEFT HEART CATHETERIZATION WITH CORONARY ANGIOGRAM;  Surgeon: Laverda Page, MD;  Location[facility];  Service: Cardiovascular;  Laterality: N/A;   LIGATION OF COMPETING BRANCHES OF ARTERIOVENOUS FISTULA Left 07/21/2020   Procedure: LIGATION OF COMPETING BRANCHES OF LEFT UPPER ARM ARTERIOVENOUS FISTULA;  Surgeon: Waynetta Sandy, MD;  Location: [facility];  Service: Vascular;  Laterality: Left;   LOWER EXTREMITY ANGIOGRAM N/A 03/19/2012   Procedure: LOWER EXTREMITY ANGIOGRAM;  Surgeon: Burnell Blanks, MD;  Location: [facility];  Service: Cardiovascular;  Laterality: N/A;   LOWER EXTREMITY ANGIOGRAPHY N/A 06/20/2021   Procedure: LOWER EXTREMITY ANGIOGRAPHY;  Surgeon: Nigel Mormon, MD;  Location: [facility];  Service: Cardiovascular;  Laterality: N/A;   NECK SURGERY     PARTIAL COLECTOMY N/A 01/13/2018   Procedure: LAPAROSCOPIC ASSISTED   SIGMOID COLECTOMY ILEOSTOMY;  Surgeon: Clovis Riley, MD;  [facility];  Service: General;  Laterality: N/A;   PENILE PROSTHESIS IMPLANT  08/14/05   INFRAPUBIC INSERTION OF INFLATABLE PENILE PROSTHESIS; SURGEON DR. Amalia Hailey   PENILE PROSTHESIS IMPLANT     PERCUTANEOUS CORONARY STENT INTERVENTION (PCI-S) Right 10/29/2014  Procedure: PERCUTANEOUS CORONARY STENT INTERVENTION (PCI-S);  Surgeon: Laverda Page, MD;  Location: [facility];  Service: Cardiovascular;  Laterality: Right;   PERIPHERAL VASCULAR INTERVENTION Left 06/23/2021   Procedure: PERIPHERAL VASCULAR INTERVENTION;  Surgeon: Nigel Mormon, MD;  Location: [facility];  Service: Cardiovascular;  Laterality: Left;   REMOVAL OF PENILE PROSTHESIS N/A 06/14/2021   Procedure: Removal of THREE piece inflatable penile  prosthesis;  Surgeon: Lucas Mallow, MD;  Location: [facility];  Service: Urology;  Laterality: N/A;   SHOULDER ARTHROSCOPY     SPINE SURGERY     TOE AMPUTATION Left    TONSILLECTOMY     TOTAL KNEE ARTHROPLASTY  07/2002   RIGHT KNEE ; SURGEON  DR. [name] ALSO HAD ARTHROSCOPIC RIGHT KNEE IN  10/2001   TOTAL KNEE ARTHROPLASTY     ULNAR NERVE TRANSPOSITION Right 10/21/2013   Procedure: RIGHT ELBOW  ULNAR NERVE DECOMPRESSION;  Surgeon: Wynonia Sours, MD;  Location: [facility];  Service: Orthopedics;  Laterality: Right;   Social History   Occupational History   Occupation: Magazine features editor: UNEMPLOYED  Tobacco Use   Smoking status: Former    Packs/day: 2.00    Years: 35.00    Pack years: 70.00    Types: Cigarettes    Quit date: 10/28/2011    Years since quitting: 9.8   Smokeless tobacco: Never  Vaping Use   Vaping Use: Some days   Substances: Nicotine  Substance and Sexual Activity   Alcohol use: Not Currently    Comment: "not in a long time"   Drug use: Never   Sexual activity: Yes    Birth control/protection: Implant    Comment: penile implant

## 2021-08-18 ENCOUNTER — Ambulatory Visit: Payer: 59

## 2021-08-18 ENCOUNTER — Ambulatory Visit (INDEPENDENT_AMBULATORY_CARE_PROVIDER_SITE_OTHER): Payer: 59

## 2021-08-21 ENCOUNTER — Ambulatory Visit (INDEPENDENT_AMBULATORY_CARE_PROVIDER_SITE_OTHER): Payer: 59 | Admitting: Orthopedic Surgery

## 2021-08-21 ENCOUNTER — Other Ambulatory Visit: Payer: Self-pay

## 2021-08-21 DIAGNOSIS — I87332 Chronic venous hypertension (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: Secondary | ICD-10-CM | POA: Diagnosis not present

## 2021-08-21 DIAGNOSIS — L97929 Non-pressure chronic ulcer of unspecified part of left lower leg with unspecified severity: Secondary | ICD-10-CM

## 2021-08-25 ENCOUNTER — Ambulatory Visit (INDEPENDENT_AMBULATORY_CARE_PROVIDER_SITE_OTHER): Payer: 59 | Admitting: Family

## 2021-08-25 DIAGNOSIS — I739 Peripheral vascular disease, unspecified: Secondary | ICD-10-CM | POA: Diagnosis not present

## 2021-08-25 DIAGNOSIS — L97521 Non-pressure chronic ulcer of other part of left foot limited to breakdown of skin: Secondary | ICD-10-CM

## 2021-08-28 ENCOUNTER — Other Ambulatory Visit: Payer: Self-pay

## 2021-08-28 ENCOUNTER — Ambulatory Visit (INDEPENDENT_AMBULATORY_CARE_PROVIDER_SITE_OTHER): Payer: 59 | Admitting: Orthopedic Surgery

## 2021-08-28 ENCOUNTER — Encounter: Payer: Self-pay | Admitting: Orthopedic Surgery

## 2021-08-28 DIAGNOSIS — L97521 Non-pressure chronic ulcer of other part of left foot limited to breakdown of skin: Secondary | ICD-10-CM | POA: Diagnosis not present

## 2021-08-28 DIAGNOSIS — I87332 Chronic venous hypertension (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: Secondary | ICD-10-CM | POA: Diagnosis not present

## 2021-08-28 DIAGNOSIS — L97929 Non-pressure chronic ulcer of unspecified part of left lower leg with unspecified severity: Secondary | ICD-10-CM | POA: Diagnosis not present

## 2021-08-28 NOTE — Progress Notes (Signed)
Office Visit Note   Patient: [name]           Date of Birth: [birth year]           MRN: [id number] Visit Date: 08/28/2021              Requested by: [name], PA-C [address] [place],  [place] [address] PCP: [name], PA-C  Chief Complaint  Patient presents with   Left Leg - Follow-up      HPI: Patient is a 65 year old gentleman who is seen in follow-up for ischemic ulcer and venous insufficiency of the left lower extremity he is currently undergoing serial compression wraps.  Assessment & Plan: Visit Diagnoses:  1. Chronic venous hypertension (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 (HCC)   2. Ischemic ulcer of left foot, limited to breakdown of skin ([place])     Plan: Continue with the compression wrap on the left follow-up for a nurse visit on Wednesday.  Follow-Up Instructions: Return in about 1 week (around 09/04/2021).   Ortho Exam  Patient is alert, oriented, no adenopathy, well-dressed, normal affect, normal respiratory effort. Examination the leg has continued decreased swelling improved epithelialization the wound over the lateral aspect the left foot has less depth it still has fibrinous exudative tissue there is no cellulitis odor or drainage.  The ischemic ulcer is stable.  Imaging: No results found. No images are attached to the encounter.  Labs: Lab Results  Component Value Date   HGBA1C 7.9 (H) 06/14/2021   HGBA1C 5.7 (H) 09/16/2018   HGBA1C 7.7 (H) 05/15/2018   ESRSEDRATE 17 (H) 06/24/2021   ESRSEDRATE 35 (H) 09/03/2016   ESRSEDRATE 111 (H) 05/11/2016   CRP 0.6 06/24/2021   CRP 1.2 (H) 09/03/2016   CRP 4.7 (H) 05/11/2016   REPTSTATUS 06/22/2021 FINAL 06/21/2021   GRAMSTAIN  06/14/2021    NO ORGANISMS SEEN SQUAMOUS EPITHELIAL CELLS PRESENT ABUNDANT WBC PRESENT,BOTH PMN AND MONONUCLEAR MODERATE GRAM POSITIVE COCCI    CULT  06/21/2021    NO GROWTH Performed at [facility], [place] [address]  [place], [place] [address]    LABORGA ENTEROCOCCUS FAECALIS 06/14/2021     Lab Results  Component Value Date   ALBUMIN 2.5 (L) 06/21/2021   ALBUMIN 2.0 (L) 06/17/2021   ALBUMIN 2.7 (L) 06/13/2021   PREALBUMIN 19.0 05/04/2016   PREALBUMIN 25.0 12/16/2015    Lab Results  Component Value Date   MG 1.9 05/24/2018   MG 2.1 05/23/2018   MG 1.3 (L) 05/22/2018   No results found for: VD25OH  Lab Results  Component Value Date   PREALBUMIN 19.0 05/04/2016   PREALBUMIN 25.0 12/16/2015   CBC EXTENDED Latest Ref Rng & Units 06/29/2021 06/28/2021 06/27/2021  WBC 4.0 - 10.5 K/uL 7.8 6.9 9.4  RBC 4.22 - 5.81 MIL/uL 3.05(L) 3.24(L) 3.13(L)  HGB 13.0 - 17.0 g/dL 9.5(L) 10.0(L) 9.5(L)  HCT 39.0 - 52.0 % 29.1(L) 30.6(L) 30.4(L)  PLT 150 - 400 K/uL 132(L) 144(L) 164  NEUTROABS 1.7 - 7.7 K/uL - - -  LYMPHSABS 0.7 - 4.0 K/uL - - -     There is no height or weight on file to calculate BMI.  Orders:  No orders of the defined types were placed in this encounter.  No orders of the defined types were placed in this encounter.    Procedures: No procedures performed  Clinical Data: No additional findings.  ROS:  All other systems negative, except as noted  in the HPI. Review of Systems  Objective: Vital Signs: There were no vitals taken for this visit.  Specialty Comments:  No specialty comments available.  PMFS History: Patient Active Problem List   Diagnosis Date Noted   Severe sepsis with lactic acidosis ([place]) 06/14/2021   Infection associated with implanted penile prosthesis ([place]) 06/14/2021   ESRD on hemodialysis ([place]) 06/14/2021   Paroxysmal atrial fibrillation with RVR ([place]) 06/14/2021   Carotid stenosis 06/15/2020   Dyslipidemia 06/15/2020   Severe nonproliferative diabetic retinopathy of right eye, with macular edema, associated with type 2 diabetes mellitus ([place]) 01/18/2020   Severe nonproliferative diabetic retinopathy of left eye, with macular edema, associated with  type 2 diabetes mellitus ([place]) 01/04/2020   Retinal hemorrhage of right eye 01/04/2020   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   Pseudoclaudication 11/15/2018   Essential hypertension 09/18/2018   S/P BKA (below knee amputation), right ([place]) 09/18/2018   Amputation of toe of left foot ([place]) 09/18/2018   Peripheral artery disease ([place]) 09/18/2018   S/P carotid endarterectomy 09/18/2018   OSA on CPAP 09/18/2018   Chronic back pain 09/18/2018   Status post reversal of ileostomy 05/21/2018   Normocytic anemia 02/14/2018   Intra-abdominal abscess ([place]) 11/04/2017   Chronic venous hypertension (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 ([place]) 12/11/2016   Ischemic ulcer of left foot, limited to breakdown of skin ([place]) 11/12/2016   Unilateral primary osteoarthritis, left knee 10/11/2016   History of right below knee amputation ([place]) 07/12/2016   Diabetic foot infection ([place]) 05/04/2016   Insulin dependent type 2 diabetes mellitus ([place]) 12/16/2015   Amputated toe ([place]) 10/21/2015   Leg edema, left 09/03/2015   CAD (dz of distal, mid and proximal RCA with implantation of 3 overlapping drug-eluting stent,) 09/03/2015   Chronic diastolic heart failure, NYHA class 2 ([place]) 09/03/2015   Angina pectoris associated with type 2 diabetes mellitus ([place]) 10/28/2014   Hyperlipidemia 08/25/2014   Limb pain 03/20/2013   DJD (degenerative joint disease) 09/25/2012   Migraine 09/25/2012   Neuropathy 09/25/2012   Restless legs syndrome (RLS) 09/25/2012   Chronic obstructive pulmonary disease, unspecified ([place]) 04/25/2012   Unknown cause of morbidity or mortality 04/25/2012   Chronic total occlusion of artery of the extremities ([place]) 04/08/2012   Onychomycosis 02/01/2012   Occlusion and stenosis of carotid artery without mention of cerebral infarction 06/12/2011   GERD (gastroesophageal reflux disease) 05/08/2011   Barrett's esophagus without dysplasia 05/08/2011   Former tobacco use  02/01/2011   Past Medical History:  Diagnosis Date   Carotid artery occlusion 11/10/10   LEFT CAROTID ENDARTERECTOMY   Chronic kidney disease    Complication of anesthesia    BP WENT UP AT [facility] "   COPD (chronic obstructive pulmonary disease) ([place])    pt denies this dx as of 06/01/20 - no inhaler    Diabetes mellitus without complication ([place])    Diverticulitis    Diverticulosis of colon (without mention of hemorrhage)    DJD (degenerative joint disease)    knees/hands/feet/back/neck   Fatty liver    Full dentures    GERD (gastroesophageal reflux disease)    H/O hiatal hernia    History of blood transfusion    with a past surical procedure per patient 06/01/20   Hyperlipidemia    Hypertension    Neuromuscular disorder ([place])    peripheral neuropathy   Non-pressure chronic ulcer of other part of left foot limited to breakdown of skin ([place]) 11/12/2016   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 Atherectomy left popliteal artery July 2012.    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 Before age 88   Hyperlipidemia Brother    Hypertension Brother    Cancer Sister        Breast   Hyperlipidemia Sister    Hypertension Sister    Hypertension Son    Arthritis Other        GRANDMOTHER   Hypertension Other        OTHER FAMILY MEMBERS    Past Surgical History:   Procedure Laterality Date   ABDOMINAL AORTOGRAM W/LOWER EXTREMITY N/A 06/23/2021   Procedure: ABDOMINAL AORTOGRAM W/LOWER EXTREMITY;  Surgeon: Nigel Mormon, MD;  Location: [facility];  Service: Cardiovascular;  Laterality: N/A;   AMPUTATION  11/05/2011   Procedure: AMPUTATION RAY;  Surgeon: Wylene Simmer, MD;  Location: [facility];  Service: Orthopedics;  Laterality: Right;  Amputation of Right 4&5th Toes   AMPUTATION Left 11/26/2012   Procedure: AMPUTATION RAY;  Surgeon: Wylene Simmer, MD;  Location: [facility];  Service: Orthopedics;  Laterality: Left;  fourth ray amputation   AMPUTATION Right 08/27/2014   Procedure: Transmetatarsal Amputation;  Surgeon: Newt Minion, MD;  Location: [facility];  Service: Orthopedics;  Laterality: Right;   AMPUTATION Right 01/14/2015   Procedure: AMPUTATION BELOW KNEE;  Surgeon: Newt Minion, MD;  Location: [facility];  Service: Orthopedics;  Laterality: Right;   AMPUTATION Left 10/21/2015   Procedure: Left Foot 5th Ray Amputation;  Surgeon: Newt Minion, MD;  Location: [facility];  Service: Orthopedics;  Laterality: Left;   ANTERIOR FUSION CERVICAL SPINE  02/06/06   C4-5, C5-6, C6-7; SURGEON DR. MAX COHEN   AV FISTULA PLACEMENT Left 06/02/2020   Procedure: ARTERIOVENOUS (AV) FISTULA CREATION LEFT;  Surgeon: Waynetta Sandy, MD;  Location: [facility];  Service: Vascular;  Laterality: Left;   BACK SURGERY     x 3   [place] Left 07/21/2020   Procedure: LEFT UPPER ARM ATERIOVENOUS SUPERFISTULALIZATION;  Surgeon: Waynetta Sandy, MD;  Location: [facility];  Service: Vascular;  Laterality: Left;   BELOW KNEE LEG AMPUTATION Right    CARDIAC CATHETERIZATION  10/31/04   2009   CAROTID ENDARTERECTOMY  11/10/10   CAROTID ENDARTERECTOMY Left 11/10/2010   Subtotal occlusion of left internal carotid artery with left hemispheric transient ischemic attacks.   CAROTID STENT     CARPAL TUNNEL RELEASE Right 10/21/2013   Procedure: RIGHT CARPAL TUNNEL RELEASE;   Surgeon: Wynonia Sours, MD;  Location: [place];  Service: Orthopedics;  Laterality: Right;   CHOLECYSTECTOMY     COLON SURGERY     COLONOSCOPY     COLOSTOMY REVERSAL  05/21/2018   ileostomy reversal   CYSTOSCOPY WITH STENT PLACEMENT Bilateral 01/13/2018   Procedure: CYSTOSCOPY WITH BILATERAL URETERAL CATHETER PLACEMENT;  Surgeon: Ardis Hughs, MD;  Location: [facility];  Service: Urology;  Laterality: Bilateral;   ESOPHAGEAL MANOMETRY Bilateral 07/19/2014   Procedure: ESOPHAGEAL MANOMETRY (EM);  Surgeon: Jerene Bears, MD;  [facility];  Service: Gastroenterology;  Laterality: Bilateral;   EYE SURGERY Bilateral 2020   cataract   FEMORAL ARTERY STENT     x6   FINGER SURGERY     FOOT SURGERY  04/25/2016    EXCISION BASE 5TH METATARSAL AND PARTIAL CUBOID LEFT FOOT   HERNIA REPAIR     LEFT INGUINAL AND UMBILICAL REPAIRS   HERNIA REPAIR     I & D EXTREMITY Left 04/25/2016   Procedure: EXCISION BASE 5TH METATARSAL AND PARTIAL CUBOID LEFT FOOT;  Surgeon: Newt Minion, MD;  Location: [facility];  Service: Orthopedics;  Laterality: Left;   ILEOSTOMY  01/13/2018   Procedure: ILEOSTOMY;  Surgeon: Clovis Riley, MD;  Location: [facility];  Service: General;;   ILEOSTOMY CLOSURE N/A 05/21/2018   Procedure: ILEOSTOMY REVERSAL ERAS PATHWAY;  Surgeon: Clovis Riley, MD;  Location: [facility];  Service: General;  Laterality: N/A;   IR RADIOLOGIST EVAL & MGMT  11/19/2017   IR RADIOLOGIST EVAL & MGMT  12/03/2017   IR RADIOLOGIST EVAL & MGMT  12/18/2017   JOINT REPLACEMENT Right 2001   Total knee   LAMINECTOMY     X 3 LUMBAR AND X 2 CERVICAL SPINE OPERATIONS   LAPAROSCOPIC CHOLECYSTECTOMY W/ CHOLANGIOGRAPHY  11/09/04   SURGEON DR. [place]way CATH AND CORONARY ANGIOGRAPHY N/A 09/16/2018   Procedure: LEFT HEART CATH AND CORONARY ANGIOGRAPHY;  Surgeon: Nigel Mormon, MD;  Location: [facility];  Service: Cardiovascular;  Laterality: N/A;   LEFT HEART  CATHETERIZATION WITH CORONARY ANGIOGRAM N/A 10/29/2014   Procedure: LEFT HEART CATHETERIZATION WITH CORONARY ANGIOGRAM;  Surgeon: Laverda Page, MD;  Location: [facility];  Service: Cardiovascular;  Laterality: N/A;   LIGATION OF COMPETING BRANCHES OF ARTERIOVENOUS FISTULA Left 07/21/2020   Procedure: LIGATION OF COMPETING BRANCHES OF LEFT UPPER ARM ARTERIOVENOUS FISTULA;  Surgeon: Waynetta Sandy, MD;  Location: [facility];  Service: Vascular;  Laterality: Left;   LOWER EXTREMITY ANGIOGRAM N/A 03/19/2012   Procedure: LOWER EXTREMITY ANGIOGRAM;  Surgeon: Burnell Blanks, MD;  Location[facility];  Service: Cardiovascular;  Laterality: N/A;   LOWER EXTREMITY ANGIOGRAPHY N/A 06/20/2021   Procedure: LOWER EXTREMITY ANGIOGRAPHY;  Surgeon: Nigel Mormon, MD;  Location: [facility];  Service: Cardiovascular;  Laterality: N/A;   NECK SURGERY     PARTIAL COLECTOMY N/A 01/13/2018   Procedure: LAPAROSCOPIC ASSISTED   SIGMOID COLECTOMY ILEOSTOMY;  Surgeon: Clovis Riley, MD;  Location[facility];  Service: General;  Laterality: N/A;   PENILE PROSTHESIS IMPLANT  08/14/05   INFRAPUBIC INSERTION OF INFLATABLE PENILE PROSTHESIS; SURGEON DR. Amalia Hailey   PENILE PROSTHESIS IMPLANT     PERCUTANEOUS CORONARY STENT INTERVENTION (PCI-S) Right 10/29/2014   Procedure: PERCUTANEOUS CORONARY STENT INTERVENTION (PCI-S);  Surgeon: Laverda Page, MD;  Location: [facility] LAB;  Service: Cardiovascular;  Laterality: Right;   PERIPHERAL VASCULAR INTERVENTION Left 06/23/2021   Procedure: PERIPHERAL VASCULAR INTERVENTION;  Surgeon: Nigel Mormon, MD;  Location: [facility]  INVASIVE CV LAB;  Service: Cardiovascular;  Laterality: Left;   REMOVAL OF PENILE PROSTHESIS N/A 06/14/2021   Procedure: Removal of THREE piece inflatable penile prosthesis;  Surgeon: Lucas Mallow, MD;  Location: [facility];  Service: Urology;  Laterality: N/A;   SHOULDER ARTHROSCOPY     SPINE SURGERY     TOE AMPUTATION Left     TONSILLECTOMY     TOTAL KNEE ARTHROPLASTY  07/2002   RIGHT KNEE ; SURGEON  DR. [name] ALSO HAD ARTHROSCOPIC RIGHT KNEE IN  10/2001   TOTAL KNEE ARTHROPLASTY     ULNAR NERVE TRANSPOSITION Right 10/21/2013   Procedure: RIGHT ELBOW  ULNAR NERVE DECOMPRESSION;  Surgeon: Wynonia Sours, MD;  Location: [facility];  Service: Orthopedics;  Laterality: Right;   Social History   Occupational History   Occupation: Magazine features editor: UNEMPLOYED  Tobacco Use   Smoking status: Former    Packs/day: 2.00    Years: 35.00    Pack years: 70.00    Types: Cigarettes    Quit date: 10/28/2011    Years since quitting: 9.8   Smokeless tobacco: Never  Vaping Use   Vaping Use: Some days   Substances: Nicotine  Substance and Sexual Activity   Alcohol use: Not Currently    Comment: "not in a long time"   Drug use: Never   Sexual activity: Yes    Birth control/protection: Implant    Comment: penile implant

## 2021-08-30 ENCOUNTER — Encounter: Payer: Self-pay | Admitting: Orthopedic Surgery

## 2021-08-30 ENCOUNTER — Encounter: Payer: Self-pay | Admitting: Family

## 2021-08-30 NOTE — Progress Notes (Signed)
Office Visit Note   Patient: [name]           Date of Birth: [birth year]           MRN: [id number] Visit Date: 08/21/2021              Requested by: [name], PA-C [address] [place],  [place] [id number] PCP: [name], PA-C  Chief Complaint  Patient presents with   Left Foot - Wound Check, Follow-up      HPI: Patient is a 65 year old gentleman who presents in follow-up for lateral ulcer ischemic left foot with chronic venous insufficiency.  Assessment & Plan: Visit Diagnoses:  1. Chronic venous hypertension (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 (HCC)     Plan: A new compression wrapping was applied to the left lower extremity.  Reevaluate in 1 week.  Follow-Up Instructions: Return in about 1 week (around 08/28/2021).   Ortho Exam  Patient is alert, oriented, no adenopathy, well-dressed, normal affect, normal respiratory effort. Examination there is decreased swelling there is fibrinous exudative tissue over the wound there is no cellulitis no exposed bone or tendon.  Imaging: No results found. No images are attached to the encounter.  Labs: Lab Results  Component Value Date   HGBA1C 7.9 (H) 06/14/2021   HGBA1C 5.7 (H) 09/16/2018   HGBA1C 7.7 (H) 05/15/2018   ESRSEDRATE 17 (H) 06/24/2021   ESRSEDRATE 35 (H) 09/03/2016   ESRSEDRATE 111 (H) 05/11/2016   CRP 0.6 06/24/2021   CRP 1.2 (H) 09/03/2016   CRP 4.7 (H) 05/11/2016   REPTSTATUS 06/22/2021 FINAL 06/21/2021   GRAMSTAIN  06/14/2021    NO ORGANISMS SEEN SQUAMOUS EPITHELIAL CELLS PRESENT ABUNDANT WBC PRESENT,BOTH PMN AND MONONUCLEAR MODERATE GRAM POSITIVE COCCI    CULT  06/21/2021    NO GROWTH Performed at [facility], [place] [address], [place], [place] [address]    LABORGA ENTEROCOCCUS FAECALIS 06/14/2021     Lab Results  Component Value Date   ALBUMIN 2.5 (L) 06/21/2021   ALBUMIN 2.0 (L) 06/17/2021   ALBUMIN 2.7 (L) 06/13/2021   PREALBUMIN 19.0  05/04/2016   PREALBUMIN 25.0 12/16/2015    Lab Results  Component Value Date   MG 1.9 05/24/2018   MG 2.1 05/23/2018   MG 1.3 (L) 05/22/2018   No results found for: VD25OH  Lab Results  Component Value Date   PREALBUMIN 19.0 05/04/2016   PREALBUMIN 25.0 12/16/2015   CBC EXTENDED Latest Ref Rng & Units 06/29/2021 06/28/2021 06/27/2021  WBC 4.0 - 10.5 K/uL 7.8 6.9 9.4  RBC 4.22 - 5.81 MIL/uL 3.05(L) 3.24(L) 3.13(L)  HGB 13.0 - 17.0 g/dL 9.5(L) 10.0(L) 9.5(L)  HCT 39.0 - 52.0 % 29.1(L) 30.6(L) 30.4(L)  PLT 150 - 400 K/uL 132(L) 144(L) 164  NEUTROABS 1.7 - 7.7 K/uL - - -  LYMPHSABS 0.7 - 4.0 K/uL - - -     There is no height or weight on file to calculate BMI.  Orders:  No orders of the defined types were placed in this encounter.  No orders of the defined types were placed in this encounter.    Procedures: No procedures performed  Clinical Data: No additional findings.  ROS:  All other systems negative, except as noted in the HPI. Review of Systems  Objective: Vital Signs: There were no vitals taken for this visit.  Specialty Comments:  No specialty comments available.  PMFS History: Patient Active Problem List   Diagnosis Date Noted  Severe sepsis with lactic acidosis ([place]) 06/14/2021   Infection associated with implanted penile prosthesis ([place]) 06/14/2021   ESRD on hemodialysis ([place]) 06/14/2021   Paroxysmal atrial fibrillation with RVR ([place]) 06/14/2021   Carotid stenosis 06/15/2020   Dyslipidemia 06/15/2020   Severe nonproliferative diabetic retinopathy of right eye, with macular edema, associated with type 2 diabetes mellitus ([place]) 01/18/2020   Severe nonproliferative diabetic retinopathy of left eye, with macular edema, associated with type 2 diabetes mellitus ([place]) 01/04/2020   Retinal hemorrhage of right eye 01/04/2020   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   Pseudoclaudication 11/15/2018   Essential hypertension 09/18/2018   S/P BKA (below knee  amputation), right ([place]) 09/18/2018   Amputation of toe of left foot ([place]) 09/18/2018   Peripheral artery disease ([place]) 09/18/2018   S/P carotid endarterectomy 09/18/2018   OSA on CPAP 09/18/2018   Chronic back pain 09/18/2018   Status post reversal of ileostomy 05/21/2018   Normocytic anemia 02/14/2018   Intra-abdominal abscess ([place]) 11/04/2017   Chronic venous hypertension (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 ([place]) 12/11/2016   Ischemic ulcer of left foot, limited to breakdown of skin ([place]) 11/12/2016   Unilateral primary osteoarthritis, left knee 10/11/2016   History of right below knee amputation ([place]) 07/12/2016   Diabetic foot infection ([place]) 05/04/2016   Insulin dependent type 2 diabetes mellitus ([place]) 12/16/2015   Amputated toe ([place]) 10/21/2015   Leg edema, left 09/03/2015   CAD (dz of distal, mid and proximal RCA with implantation of 3 overlapping drug-eluting stent,) 09/03/2015   Chronic diastolic heart failure, NYHA class 2 ([place]) 09/03/2015   Angina pectoris associated with type 2 diabetes mellitus ([place]) 10/28/2014   Hyperlipidemia 08/25/2014   Limb pain 03/20/2013   DJD (degenerative joint disease) 09/25/2012   Migraine 09/25/2012   Neuropathy 09/25/2012   Restless legs syndrome (RLS) 09/25/2012   Chronic obstructive pulmonary disease, unspecified ([place]) 04/25/2012   Unknown cause of morbidity or mortality 04/25/2012   Chronic total occlusion of artery of the extremities ([place]) 04/08/2012   Onychomycosis 02/01/2012   Occlusion and stenosis of carotid artery without mention of cerebral infarction 06/12/2011   GERD (gastroesophageal reflux disease) 05/08/2011   Barrett's esophagus without dysplasia 05/08/2011   Former tobacco use 02/01/2011   Past Medical History:  Diagnosis Date   Carotid artery occlusion 11/10/10   LEFT CAROTID ENDARTERECTOMY   Chronic kidney disease    Complication of anesthesia    BP WENT UP AT [facility] "   COPD (chronic  obstructive pulmonary disease) ([place])    pt denies this dx as of 06/01/20 - no inhaler    Diabetes mellitus without complication ([place])    Diverticulitis    Diverticulosis of colon (without mention of hemorrhage)    DJD (degenerative joint disease)    knees/hands/feet/back/neck   Fatty liver    Full dentures    GERD (gastroesophageal reflux disease)    H/O hiatal hernia    History of blood transfusion    with a past surical procedure per patient 06/01/20   Hyperlipidemia    Hypertension    Neuromuscular disorder ([place])    peripheral neuropathy   Non-pressure chronic ulcer of other part of left foot limited to breakdown of skin ([place]) 11/12/2016   Osteomyelitis (HCC)    left 5th metatarsal   PAD (peripheral artery disease) ([place])    Distal aortogram June 2012. Atherectomy left popliteal artery July 2012.    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 Before age 77   Hyperlipidemia Brother    Hypertension Brother    Cancer Sister        Breast   Hyperlipidemia Sister    Hypertension Sister    Hypertension Son    Arthritis Other        GRANDMOTHER   Hypertension Other        OTHER FAMILY MEMBERS    Past Surgical History:  Procedure Laterality Date   ABDOMINAL AORTOGRAM W/LOWER EXTREMITY N/A 06/23/2021   Procedure: ABDOMINAL AORTOGRAM W/LOWER EXTREMITY;  Surgeon: Nigel Mormon, MD;  Location: [facility];  Service:  Cardiovascular;  Laterality: N/A;   AMPUTATION  11/05/2011   Procedure: AMPUTATION RAY;  Surgeon: Wylene Simmer, MD;  Location: [place] [facility];  Service: Orthopedics;  Laterality: Right;  Amputation of Right 4&5th Toes   AMPUTATION Left 11/26/2012   Procedure: AMPUTATION RAY;  Surgeon: Wylene Simmer, MD;  Location: [facility];  Service: Orthopedics;  Laterality: Left;  fourth ray amputation   AMPUTATION Right 08/27/2014   Procedure: Transmetatarsal Amputation;  Surgeon: Newt Minion, MD;  Location: [facility];  Service: Orthopedics;  Laterality: Right;   AMPUTATION Right 01/14/2015   Procedure: AMPUTATION BELOW KNEE;  Surgeon: Newt Minion, MD;  Location: [facility];  Service: Orthopedics;  Laterality: Right;   AMPUTATION Left 10/21/2015   Procedure: Left Foot 5th Ray Amputation;  Surgeon: Newt Minion, MD;  Location: [facility];  Service: Orthopedics;  Laterality: Left;   ANTERIOR FUSION CERVICAL SPINE  02/06/06   C4-5, C5-6, C6-7; SURGEON DR. MAX COHEN   AV FISTULA PLACEMENT Left 06/02/2020   Procedure: ARTERIOVENOUS (AV) FISTULA CREATION LEFT;  Surgeon: Waynetta Sandy, MD;  Location: [facility];  Service: Vascular;  Laterality: Left;   BACK SURGERY     x 3   [place] Left 07/21/2020   Procedure: LEFT UPPER ARM ATERIOVENOUS SUPERFISTULALIZATION;  Surgeon: Waynetta Sandy, MD;  Location: [facility];  Service: Vascular;  Laterality: Left;   BELOW KNEE LEG AMPUTATION Right    CARDIAC CATHETERIZATION  10/31/04   2009   CAROTID ENDARTERECTOMY  11/10/10   CAROTID ENDARTERECTOMY Left 11/10/2010   Subtotal occlusion of left internal carotid artery with left hemispheric transient ischemic attacks.   CAROTID STENT     CARPAL TUNNEL RELEASE Right 10/21/2013   Procedure: RIGHT CARPAL TUNNEL RELEASE;  Surgeon: Wynonia Sours, MD;  Location: [facility];  Service: Orthopedics;  Laterality: Right;   CHOLECYSTECTOMY     COLON SURGERY     COLONOSCOPY     COLOSTOMY REVERSAL  05/21/2018   ileostomy  reversal   CYSTOSCOPY WITH STENT PLACEMENT Bilateral 01/13/2018  Procedure: CYSTOSCOPY WITH BILATERAL URETERAL CATHETER PLACEMENT;  Surgeon: Ardis Hughs, MD;  Location: [facility];  Service: Urology;  Laterality: Bilateral;   ESOPHAGEAL MANOMETRY Bilateral 07/19/2014   Procedure: ESOPHAGEAL MANOMETRY (EM);  Surgeon: Jerene Bears, MD;  [facility];  Service: Gastroenterology;  Laterality: Bilateral;   EYE SURGERY Bilateral 2020   cataract   FEMORAL ARTERY STENT     x6   FINGER SURGERY     FOOT SURGERY  04/25/2016    EXCISION BASE 5TH METATARSAL AND PARTIAL CUBOID LEFT FOOT   HERNIA REPAIR     LEFT INGUINAL AND UMBILICAL REPAIRS   HERNIA REPAIR     I & D EXTREMITY Left 04/25/2016   Procedure: EXCISION BASE 5TH METATARSAL AND PARTIAL CUBOID LEFT FOOT;  Surgeon: Newt Minion, MD;  Location: [facility];  Service: Orthopedics;  Laterality: Left;   ILEOSTOMY  01/13/2018   Procedure: ILEOSTOMY;  Surgeon: Clovis Riley, MD;  Location: [facility];  Service: General;;   ILEOSTOMY CLOSURE N/A 05/21/2018   Procedure: ILEOSTOMY REVERSAL ERAS PATHWAY;  Surgeon: Clovis Riley, MD;  Location: [facility];  Service: General;  Laterality: N/A;   IR RADIOLOGIST EVAL & MGMT  11/19/2017   IR RADIOLOGIST EVAL & MGMT  12/03/2017   IR RADIOLOGIST EVAL & MGMT  12/18/2017   JOINT REPLACEMENT Right 2001   Total knee   LAMINECTOMY     X 3 LUMBAR AND X 2 CERVICAL SPINE OPERATIONS   LAPAROSCOPIC CHOLECYSTECTOMY W/ CHOLANGIOGRAPHY  11/09/04   SURGEON DR. Vadito CATH AND CORONARY ANGIOGRAPHY N/A 09/16/2018   Procedure: LEFT HEART CATH AND CORONARY ANGIOGRAPHY;  Surgeon: Nigel Mormon, MD;  Location: [facility];  Service: Cardiovascular;  Laterality: N/A;   LEFT HEART CATHETERIZATION WITH CORONARY ANGIOGRAM N/A 10/29/2014   Procedure: LEFT HEART CATHETERIZATION WITH CORONARY ANGIOGRAM;  Surgeon: Laverda Page, MD;  Location: [facility];  Service: Cardiovascular;  Laterality: N/A;    LIGATION OF COMPETING BRANCHES OF ARTERIOVENOUS FISTULA Left 07/21/2020   Procedure: LIGATION OF COMPETING BRANCHES OF LEFT UPPER ARM ARTERIOVENOUS FISTULA;  Surgeon: Waynetta Sandy, MD;  Location: [place] [place];  Service: Vascular;  Laterality: Left;   LOWER EXTREMITY ANGIOGRAM N/A 03/19/2012   Procedure: LOWER EXTREMITY ANGIOGRAM;  Surgeon: Burnell Blanks, MD;  Location: [facility];  Service: Cardiovascular;  Laterality: N/A;   LOWER EXTREMITY ANGIOGRAPHY N/A 06/20/2021   Procedure: LOWER EXTREMITY ANGIOGRAPHY;  Surgeon: Nigel Mormon, MD;  Location: [facility];  Service: Cardiovascular;  Laterality: N/A;   NECK SURGERY     PARTIAL COLECTOMY N/A 01/13/2018   Procedure: LAPAROSCOPIC ASSISTED   SIGMOID COLECTOMY ILEOSTOMY;  Surgeon: Clovis Riley, MD;  Location: [facility];  Service: General;  Laterality: N/A;   PENILE PROSTHESIS IMPLANT  08/14/05   INFRAPUBIC INSERTION OF INFLATABLE PENILE PROSTHESIS; SURGEON DR. Amalia Hailey   PENILE PROSTHESIS IMPLANT     PERCUTANEOUS CORONARY STENT INTERVENTION (PCI-S) Right 10/29/2014   Procedure: PERCUTANEOUS CORONARY STENT INTERVENTION (PCI-S);  Surgeon: Laverda Page, MD;  Location: [facility];  Service: Cardiovascular;  Laterality: Right;   PERIPHERAL VASCULAR INTERVENTION Left 06/23/2021   Procedure: PERIPHERAL VASCULAR INTERVENTION;  Surgeon: Nigel Mormon, MD;  Location: [facility];  Service: Cardiovascular;  Laterality: Left;   REMOVAL OF PENILE PROSTHESIS N/A 06/14/2021   Procedure: Removal of THREE piece inflatable penile prosthesis;  Surgeon: Lucas Mallow, MD;  Location: [facility];  Service: Urology;  Laterality: N/A;   SHOULDER ARTHROSCOPY     SPINE SURGERY     TOE AMPUTATION Left    TONSILLECTOMY     TOTAL KNEE ARTHROPLASTY  07/2002   RIGHT KNEE ; SURGEON  DR. [name] ALSO HAD ARTHROSCOPIC RIGHT KNEE IN  10/2001   TOTAL KNEE ARTHROPLASTY     ULNAR NERVE TRANSPOSITION Right 10/21/2013   Procedure: RIGHT  ELBOW  ULNAR NERVE DECOMPRESSION;  Surgeon: Wynonia Sours, MD;  Location: [facility];  Service: Orthopedics;  Laterality: Right;   Social History   Occupational History   Occupation: Magazine features editor: UNEMPLOYED  Tobacco Use   Smoking status: Former    Packs/day: 2.00    Years: 35.00    Pack years: 70.00    Types: Cigarettes    Quit date: 10/28/2011    Years since quitting: 9.8   Smokeless tobacco: Never  Vaping Use   Vaping Use: Some days   Substances: Nicotine  Substance and Sexual Activity   Alcohol use: Not Currently    Comment: "not in a long time"   Drug use: Never   Sexual activity: Yes    Birth control/protection: Implant    Comment: penile implant

## 2021-08-30 NOTE — Progress Notes (Signed)
Office Visit Note   Patient: [name]           Date of Birth: [birth year]           MRN: [id number] Visit Date: 08/25/2021              Requested by: [name], PA-C [address] [place],  [place] [address] PCP: [name], PA-C  Chief Complaint  Patient presents with   Left Foot - Wound Check       HPI: Patient is a 65 year old gentleman who presents in follow-up for venous insufficiency ulcers left leg as well as a ischemic ulcer base of the fifth metatarsal left foot.  He has been having twice weekly dressing changes.  Recently completed a course of doxycycline  Assessment & Plan: Visit Diagnoses:  No diagnosis found.   Plan: The left lower extremity was rewrapped with a 3 layer compression wrap, Prisma placed in the foot ulcer bed.  Plan to follow-up twice a week.  Follow-Up Instructions: Return in about 1 week (around 09/01/2021).   Ortho Exam  Patient is alert, oriented, no adenopathy, well-dressed, normal affect, normal respiratory effort. Examination the ulcer over the base of the fifth metatarsal is unchanged in size.  This is filled in with 100% fibrinous exudative tissue.  This is 5 mm deep.  There is no epiboly.  Good wrinkling of the skin from compression of the ulcers to his leg or nearly healed.  There is no drainage from the leg, no erythema no odor.    Imaging: No results found. No images are attached to the encounter.  Labs: Lab Results  Component Value Date   HGBA1C 7.9 (H) 06/14/2021   HGBA1C 5.7 (H) 09/16/2018   HGBA1C 7.7 (H) 05/15/2018   ESRSEDRATE 17 (H) 06/24/2021   ESRSEDRATE 35 (H) 09/03/2016   ESRSEDRATE 111 (H) 05/11/2016   CRP 0.6 06/24/2021   CRP 1.2 (H) 09/03/2016   CRP 4.7 (H) 05/11/2016   REPTSTATUS 06/22/2021 FINAL 06/21/2021   GRAMSTAIN  06/14/2021    NO ORGANISMS SEEN SQUAMOUS EPITHELIAL CELLS PRESENT ABUNDANT WBC PRESENT,BOTH PMN AND MONONUCLEAR MODERATE GRAM POSITIVE COCCI    CULT  06/21/2021     NO GROWTH Performed at [facility], [place] [address], [place], [place] [address]    LABORGA ENTEROCOCCUS FAECALIS 06/14/2021     Lab Results  Component Value Date   ALBUMIN 2.5 (L) 06/21/2021   ALBUMIN 2.0 (L) 06/17/2021   ALBUMIN 2.7 (L) 06/13/2021   PREALBUMIN 19.0 05/04/2016   PREALBUMIN 25.0 12/16/2015    Lab Results  Component Value Date   MG 1.9 05/24/2018   MG 2.1 05/23/2018   MG 1.3 (L) 05/22/2018   No results found for: VD25OH  Lab Results  Component Value Date   PREALBUMIN 19.0 05/04/2016   PREALBUMIN 25.0 12/16/2015   CBC EXTENDED Latest Ref Rng & Units 06/29/2021 06/28/2021 06/27/2021  WBC 4.0 - 10.5 K/uL 7.8 6.9 9.4  RBC 4.22 - 5.81 MIL/uL 3.05(L) 3.24(L) 3.13(L)  HGB 13.0 - 17.0 g/dL 9.5(L) 10.0(L) 9.5(L)  HCT 39.0 - 52.0 % 29.1(L) 30.6(L) 30.4(L)  PLT 150 - 400 K/uL 132(L) 144(L) 164  NEUTROABS 1.7 - 7.7 K/uL - - -  LYMPHSABS 0.7 - 4.0 K/uL - - -     There is no height or weight on file to calculate BMI.  Orders:  No orders of the defined types were placed in this encounter.  No orders of the defined types  were placed in this encounter.    Procedures: No procedures performed  Clinical Data: No additional findings.  ROS:  All other systems negative, except as noted in the HPI. Review of Systems  Objective: Vital Signs: There were no vitals taken for this visit.  Specialty Comments:  No specialty comments available.  PMFS History: Patient Active Problem List   Diagnosis Date Noted   Severe sepsis with lactic acidosis ([place]) 06/14/2021   Infection associated with implanted penile prosthesis ([place]) 06/14/2021   ESRD on hemodialysis ([place]) 06/14/2021   Paroxysmal atrial fibrillation with RVR ([place]) 06/14/2021   Carotid stenosis 06/15/2020   Dyslipidemia 06/15/2020   Severe nonproliferative diabetic retinopathy of right eye, with macular edema, associated with type 2 diabetes mellitus ([place]) 01/18/2020   Severe nonproliferative  diabetic retinopathy of left eye, with macular edema, associated with type 2 diabetes mellitus ([place]) 01/04/2020   Retinal hemorrhage of right eye 01/04/2020   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   Pseudoclaudication 11/15/2018   Essential hypertension 09/18/2018   S/P BKA (below knee amputation), right ([place]) 09/18/2018   Amputation of toe of left foot ([place]) 09/18/2018   Peripheral artery disease ([place]) 09/18/2018   S/P carotid endarterectomy 09/18/2018   OSA on CPAP 09/18/2018   Chronic back pain 09/18/2018   Status post reversal of ileostomy 05/21/2018   Normocytic anemia 02/14/2018   Intra-abdominal abscess ([place]) 11/04/2017   Chronic venous hypertension (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 ([place]) 12/11/2016   Ischemic ulcer of left foot, limited to breakdown of skin ([place]) 11/12/2016   Unilateral primary osteoarthritis, left knee 10/11/2016   History of right below knee amputation ([place]) 07/12/2016   Diabetic foot infection ([place]) 05/04/2016   Insulin dependent type 2 diabetes mellitus ([place]) 12/16/2015   Amputated toe ([place]) 10/21/2015   Leg edema, left 09/03/2015   CAD (dz of distal, mid and proximal RCA with implantation of 3 overlapping drug-eluting stent,) 09/03/2015   Chronic diastolic heart failure, NYHA class 2 ([place]) 09/03/2015   Angina pectoris associated with type 2 diabetes mellitus ([place]) 10/28/2014   Hyperlipidemia 08/25/2014   Limb pain 03/20/2013   DJD (degenerative joint disease) 09/25/2012   Migraine 09/25/2012   Neuropathy 09/25/2012   Restless legs syndrome (RLS) 09/25/2012   Chronic obstructive pulmonary disease, unspecified ([place]) 04/25/2012   Unknown cause of morbidity or mortality 04/25/2012   Chronic total occlusion of artery of the extremities ([place]) 04/08/2012   Onychomycosis 02/01/2012   Occlusion and stenosis of carotid artery without mention of cerebral infarction 06/12/2011   GERD (gastroesophageal reflux disease) 05/08/2011    Barrett's esophagus without dysplasia 05/08/2011   Former tobacco use 02/01/2011   Past Medical History:  Diagnosis Date   Carotid artery occlusion 11/10/10   LEFT CAROTID ENDARTERECTOMY   Chronic kidney disease    Complication of anesthesia    BP WENT UP AT [facility] "   COPD (chronic obstructive pulmonary disease) ([place])    pt denies this dx as of 06/01/20 - no inhaler    Diabetes mellitus without complication ([place])    Diverticulitis    Diverticulosis of colon (without mention of hemorrhage)    DJD (degenerative joint disease)    knees/hands/feet/back/neck   Fatty liver    Full dentures    GERD (gastroesophageal reflux disease)    H/O hiatal hernia    History of blood transfusion    with a past surical procedure per patient 06/01/20   Hyperlipidemia    Hypertension    Neuromuscular disorder (  [place])    peripheral neuropathy   Non-pressure chronic ulcer of other part of left foot limited to breakdown of skin ([place]) 11/12/2016   Osteomyelitis ([facility])    left 5th metatarsal   PAD (peripheral artery disease) ([place])    Distal aortogram June 2012. Atherectomy left popliteal artery July 2012.    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 Before age 61   Hyperlipidemia Brother    Hypertension Brother    Cancer Sister        Breast   Hyperlipidemia Sister    Hypertension Sister    Hypertension Son    Arthritis Other        GRANDMOTHER   Hypertension  Other        OTHER FAMILY MEMBERS    Past Surgical History:  Procedure Laterality Date   ABDOMINAL AORTOGRAM W/LOWER EXTREMITY N/A 06/23/2021   Procedure: ABDOMINAL AORTOGRAM W/LOWER EXTREMITY;  Surgeon: Nigel Mormon, MD;  Location: [facility];  Service: Cardiovascular;  Laterality: N/A;   AMPUTATION  11/05/2011   Procedure: AMPUTATION RAY;  Surgeon: Wylene Simmer, MD;  Location: [facility];  Service: Orthopedics;  Laterality: Right;  Amputation of Right 4&5th Toes   AMPUTATION Left 11/26/2012   Procedure: AMPUTATION RAY;  Surgeon: Wylene Simmer, MD;  Location: [facility];  Service: Orthopedics;  Laterality: Left;  fourth ray amputation   AMPUTATION Right 08/27/2014   Procedure: Transmetatarsal Amputation;  Surgeon: Newt Minion, MD;  Location: [facility];  Service: Orthopedics;  Laterality: Right;   AMPUTATION Right 01/14/2015   Procedure: AMPUTATION BELOW KNEE;  Surgeon: Newt Minion, MD;  Location: [facility];  Service: Orthopedics;  Laterality: Right;   AMPUTATION Left 10/21/2015   Procedure: Left Foot 5th Ray Amputation;  Surgeon: Newt Minion, MD;  Location: [facility];  Service: Orthopedics;  Laterality: Left;   ANTERIOR FUSION CERVICAL SPINE  02/06/06   C4-5, C5-6, C6-7; SURGEON DR. MAX COHEN   AV FISTULA PLACEMENT Left 06/02/2020   Procedure: ARTERIOVENOUS (AV) FISTULA CREATION LEFT;  Surgeon: Waynetta Sandy, MD;  Location: [facility];  Service: Vascular;  Laterality: Left;   BACK SURGERY     x 3   [place] Left 07/21/2020   Procedure: LEFT UPPER ARM ATERIOVENOUS SUPERFISTULALIZATION;  Surgeon: Waynetta Sandy, MD;  Location: [facility];  Service: Vascular;  Laterality: Left;   BELOW KNEE LEG AMPUTATION Right    CARDIAC CATHETERIZATION  10/31/04   2009   CAROTID ENDARTERECTOMY  11/10/10   CAROTID ENDARTERECTOMY Left 11/10/2010   Subtotal occlusion of left internal carotid artery with left hemispheric transient ischemic attacks.   CAROTID STENT  CARPAL TUNNEL  RELEASE Right 10/21/2013   Procedure: RIGHT CARPAL TUNNEL RELEASE;  Surgeon: Wynonia Sours, MD;  Location: [facility];  Service: Orthopedics;  Laterality: Right;   CHOLECYSTECTOMY     COLON SURGERY     COLONOSCOPY     COLOSTOMY REVERSAL  05/21/2018   ileostomy reversal   CYSTOSCOPY WITH STENT PLACEMENT Bilateral 01/13/2018   Procedure: CYSTOSCOPY WITH BILATERAL URETERAL CATHETER PLACEMENT;  Surgeon: Ardis Hughs, MD;  Location: [facility];  Service: Urology;  Laterality: Bilateral;   ESOPHAGEAL MANOMETRY Bilateral 07/19/2014   Procedure: ESOPHAGEAL MANOMETRY (EM);  Surgeon: Jerene Bears, MD;  [facility];  Service: Gastroenterology;  Laterality: Bilateral;   EYE SURGERY Bilateral 2020   cataract   FEMORAL ARTERY STENT     x6   FINGER SURGERY     FOOT SURGERY  04/25/2016    EXCISION BASE 5TH METATARSAL AND PARTIAL CUBOID LEFT FOOT   HERNIA REPAIR     LEFT INGUINAL AND UMBILICAL REPAIRS   HERNIA REPAIR     I & D EXTREMITY Left 04/25/2016   Procedure: EXCISION BASE 5TH METATARSAL AND PARTIAL CUBOID LEFT FOOT;  Surgeon: Newt Minion, MD;  Location: [facility];  Service: Orthopedics;  Laterality: Left;   ILEOSTOMY  01/13/2018   Procedure: ILEOSTOMY;  Surgeon: Clovis Riley, MD;  Location: [facility];  Service: General;;   ILEOSTOMY CLOSURE N/A 05/21/2018   Procedure: ILEOSTOMY REVERSAL ERAS PATHWAY;  Surgeon: Clovis Riley, MD;  Location: [facility];  Service: General;  Laterality: N/A;   IR RADIOLOGIST EVAL & MGMT  11/19/2017   IR RADIOLOGIST EVAL & MGMT  12/03/2017   IR RADIOLOGIST EVAL & MGMT  12/18/2017   JOINT REPLACEMENT Right 2001   Total knee   LAMINECTOMY     X 3 LUMBAR AND X 2 CERVICAL SPINE OPERATIONS   LAPAROSCOPIC CHOLECYSTECTOMY W/ CHOLANGIOGRAPHY  11/09/04   SURGEON DR. Goodrich CATH AND CORONARY ANGIOGRAPHY N/A 09/16/2018   Procedure: LEFT HEART CATH AND CORONARY ANGIOGRAPHY;  Surgeon: Nigel Mormon, MD;  Location: [facility];   Service: Cardiovascular;  Laterality: N/A;   LEFT HEART CATHETERIZATION WITH CORONARY ANGIOGRAM N/A 10/29/2014   Procedure: LEFT HEART CATHETERIZATION WITH CORONARY ANGIOGRAM;  Surgeon: Laverda Page, MD;  Location: [facility];  Service: Cardiovascular;  Laterality: N/A;   LIGATION OF COMPETING BRANCHES OF ARTERIOVENOUS FISTULA Left 07/21/2020   Procedure: LIGATION OF COMPETING BRANCHES OF LEFT UPPER ARM ARTERIOVENOUS FISTULA;  Surgeon: Waynetta Sandy, MD;  Location: [facility];  Service: Vascular;  Laterality: Left;   LOWER EXTREMITY ANGIOGRAM N/A 03/19/2012   Procedure: LOWER EXTREMITY ANGIOGRAM;  Surgeon: Burnell Blanks, MD;  Location: [facility];  Service: Cardiovascular;  Laterality: N/A;   LOWER EXTREMITY ANGIOGRAPHY N/A 06/20/2021   Procedure: LOWER EXTREMITY ANGIOGRAPHY;  Surgeon: Nigel Mormon, MD;  Location: [facility];  Service: Cardiovascular;  Laterality: N/A;   NECK SURGERY     PARTIAL COLECTOMY N/A 01/13/2018   Procedure: LAPAROSCOPIC ASSISTED   SIGMOID COLECTOMY ILEOSTOMY;  Surgeon: Clovis Riley, MD;  [facility];  Service: General;  Laterality: N/A;   PENILE PROSTHESIS IMPLANT  08/14/05   INFRAPUBIC INSERTION OF INFLATABLE PENILE PROSTHESIS; SURGEON DR. Amalia Hailey   PENILE PROSTHESIS IMPLANT     PERCUTANEOUS CORONARY STENT INTERVENTION (PCI-S) Right 10/29/2014   Procedure: PERCUTANEOUS CORONARY STENT INTERVENTION (PCI-S);  Surgeon: Laverda Page, MD;  Location: [facility];  Service: Cardiovascular;  Laterality: Right;   PERIPHERAL VASCULAR INTERVENTION Left 06/23/2021   Procedure: PERIPHERAL VASCULAR INTERVENTION;  Surgeon: Nigel Mormon, MD;  Location: [facility];  Service: Cardiovascular;  Laterality: Left;   REMOVAL OF PENILE PROSTHESIS N/A 06/14/2021   Procedure: Removal of THREE piece inflatable penile prosthesis;  Surgeon: Lucas Mallow, MD;  Location: [facility];  Service: Urology;  Laterality: N/A;   SHOULDER  ARTHROSCOPY     SPINE SURGERY     TOE AMPUTATION Left    TONSILLECTOMY     TOTAL KNEE ARTHROPLASTY  07/2002   RIGHT KNEE ; SURGEON  DR. [name] ALSO HAD ARTHROSCOPIC RIGHT KNEE IN  10/2001   TOTAL KNEE ARTHROPLASTY     ULNAR NERVE TRANSPOSITION Right 10/21/2013   Procedure: RIGHT ELBOW  ULNAR NERVE DECOMPRESSION;  Surgeon: Wynonia Sours, MD;  Location: [facility];  Service: Orthopedics;  Laterality: Right;   Social History   Occupational History   Occupation: Magazine features editor: UNEMPLOYED  Tobacco Use   Smoking status: Former    Packs/day: 2.00    Years: 35.00    Pack years: 70.00    Types: Cigarettes    Quit date: 10/28/2011    Years since quitting: 9.8   Smokeless tobacco: Never  Vaping Use   Vaping Use: Some days   Substances: Nicotine  Substance and Sexual Activity   Alcohol use: Not Currently    Comment: "not in a long time"   Drug use: Never   Sexual activity: Yes    Birth control/protection: Implant    Comment: penile implant

## 2021-09-01 ENCOUNTER — Ambulatory Visit (INDEPENDENT_AMBULATORY_CARE_PROVIDER_SITE_OTHER): Payer: 59 | Admitting: Family

## 2021-09-01 DIAGNOSIS — L97929 Non-pressure chronic ulcer of unspecified part of left lower leg with unspecified severity: Secondary | ICD-10-CM

## 2021-09-01 DIAGNOSIS — L97521 Non-pressure chronic ulcer of other part of left foot limited to breakdown of skin: Secondary | ICD-10-CM

## 2021-09-01 DIAGNOSIS — I87332 Chronic venous hypertension (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: Secondary | ICD-10-CM | POA: Diagnosis not present

## 2021-09-01 NOTE — Progress Notes (Signed)
Dressing changed today. No change.  Nurse only visit.

## 2021-09-02 ENCOUNTER — Inpatient Hospital Stay (HOSPITAL_COMMUNITY)
Admission: EM | Admit: 2021-09-02 | Discharge: 2021-09-07 | DRG: 871 | Disposition: A | Discharge status: Home / Self Care | Payer: 59 | Attending: Internal Medicine | Admitting: Internal Medicine

## 2021-09-02 ENCOUNTER — Emergency Department (HOSPITAL_COMMUNITY): Payer: 59

## 2021-09-02 ENCOUNTER — Encounter (HOSPITAL_COMMUNITY): Payer: Self-pay | Admitting: Internal Medicine

## 2021-09-02 ENCOUNTER — Other Ambulatory Visit: Payer: Self-pay

## 2021-09-02 DIAGNOSIS — I2510 Atherosclerotic heart disease of native coronary artery without angina pectoris: Secondary | ICD-10-CM | POA: Diagnosis present

## 2021-09-02 DIAGNOSIS — D631 Anemia in chronic kidney disease: Secondary | ICD-10-CM | POA: Diagnosis present

## 2021-09-02 DIAGNOSIS — M549 Dorsalgia, unspecified: Secondary | ICD-10-CM | POA: Diagnosis present

## 2021-09-02 DIAGNOSIS — E1169 Type 2 diabetes mellitus with other specified complication: Secondary | ICD-10-CM | POA: Diagnosis present

## 2021-09-02 DIAGNOSIS — N186 End stage renal disease: Secondary | ICD-10-CM | POA: Diagnosis present

## 2021-09-02 DIAGNOSIS — J1282 Pneumonia due to coronavirus disease 2019: Secondary | ICD-10-CM | POA: Diagnosis present

## 2021-09-02 DIAGNOSIS — Z89511 Acquired absence of right leg below knee: Secondary | ICD-10-CM

## 2021-09-02 DIAGNOSIS — E11319 Type 2 diabetes mellitus with unspecified diabetic retinopathy without macular edema: Secondary | ICD-10-CM | POA: Diagnosis present

## 2021-09-02 DIAGNOSIS — E1151 Type 2 diabetes mellitus with diabetic peripheral angiopathy without gangrene: Secondary | ICD-10-CM | POA: Diagnosis present

## 2021-09-02 DIAGNOSIS — R410 Disorientation, unspecified: Secondary | ICD-10-CM | POA: Diagnosis present

## 2021-09-02 DIAGNOSIS — G9341 Metabolic encephalopathy: Secondary | ICD-10-CM | POA: Diagnosis present

## 2021-09-02 DIAGNOSIS — G8929 Other chronic pain: Secondary | ICD-10-CM | POA: Diagnosis present

## 2021-09-02 DIAGNOSIS — Z955 Presence of coronary angioplasty implant and graft: Secondary | ICD-10-CM

## 2021-09-02 DIAGNOSIS — Z9889 Other specified postprocedural states: Secondary | ICD-10-CM

## 2021-09-02 DIAGNOSIS — J189 Pneumonia, unspecified organism: Secondary | ICD-10-CM

## 2021-09-02 DIAGNOSIS — E1140 Type 2 diabetes mellitus with diabetic neuropathy, unspecified: Secondary | ICD-10-CM | POA: Diagnosis present

## 2021-09-02 DIAGNOSIS — L97529 Non-pressure chronic ulcer of other part of left foot with unspecified severity: Secondary | ICD-10-CM | POA: Diagnosis present

## 2021-09-02 DIAGNOSIS — G4733 Obstructive sleep apnea (adult) (pediatric): Secondary | ICD-10-CM

## 2021-09-02 DIAGNOSIS — R6520 Severe sepsis without septic shock: Secondary | ICD-10-CM | POA: Diagnosis present

## 2021-09-02 DIAGNOSIS — Z8249 Family history of ischemic heart disease and other diseases of the circulatory system: Secondary | ICD-10-CM

## 2021-09-02 DIAGNOSIS — D6959 Other secondary thrombocytopenia: Secondary | ICD-10-CM | POA: Diagnosis present

## 2021-09-02 DIAGNOSIS — M86172 Other acute osteomyelitis, left ankle and foot: Secondary | ICD-10-CM | POA: Diagnosis present

## 2021-09-02 DIAGNOSIS — R0902 Hypoxemia: Secondary | ICD-10-CM | POA: Diagnosis present

## 2021-09-02 DIAGNOSIS — E1122 Type 2 diabetes mellitus with diabetic chronic kidney disease: Secondary | ICD-10-CM | POA: Diagnosis present

## 2021-09-02 DIAGNOSIS — E785 Hyperlipidemia, unspecified: Secondary | ICD-10-CM | POA: Diagnosis present

## 2021-09-02 DIAGNOSIS — Z96651 Presence of right artificial knee joint: Secondary | ICD-10-CM | POA: Diagnosis present

## 2021-09-02 DIAGNOSIS — G546 Phantom limb syndrome with pain: Secondary | ICD-10-CM | POA: Diagnosis present

## 2021-09-02 DIAGNOSIS — G894 Chronic pain syndrome: Secondary | ICD-10-CM | POA: Diagnosis present

## 2021-09-02 DIAGNOSIS — I5032 Chronic diastolic (congestive) heart failure: Secondary | ICD-10-CM | POA: Diagnosis present

## 2021-09-02 DIAGNOSIS — A419 Sepsis, unspecified organism: Secondary | ICD-10-CM | POA: Diagnosis not present

## 2021-09-02 DIAGNOSIS — Z79899 Other long term (current) drug therapy: Secondary | ICD-10-CM

## 2021-09-02 DIAGNOSIS — E11621 Type 2 diabetes mellitus with foot ulcer: Secondary | ICD-10-CM | POA: Diagnosis present

## 2021-09-02 DIAGNOSIS — Z833 Family history of diabetes mellitus: Secondary | ICD-10-CM

## 2021-09-02 DIAGNOSIS — K760 Fatty (change of) liver, not elsewhere classified: Secondary | ICD-10-CM | POA: Diagnosis present

## 2021-09-02 DIAGNOSIS — I482 Chronic atrial fibrillation, unspecified: Secondary | ICD-10-CM | POA: Diagnosis present

## 2021-09-02 DIAGNOSIS — A4189 Other specified sepsis: Principal | ICD-10-CM | POA: Diagnosis present

## 2021-09-02 DIAGNOSIS — J449 Chronic obstructive pulmonary disease, unspecified: Secondary | ICD-10-CM | POA: Diagnosis present

## 2021-09-02 DIAGNOSIS — I132 Hypertensive heart and chronic kidney disease with heart failure and with stage 5 chronic kidney disease, or end stage renal disease: Secondary | ICD-10-CM | POA: Diagnosis present

## 2021-09-02 DIAGNOSIS — Z992 Dependence on renal dialysis: Secondary | ICD-10-CM

## 2021-09-02 DIAGNOSIS — M86672 Other chronic osteomyelitis, left ankle and foot: Secondary | ICD-10-CM | POA: Diagnosis present

## 2021-09-02 DIAGNOSIS — E1165 Type 2 diabetes mellitus with hyperglycemia: Secondary | ICD-10-CM | POA: Diagnosis present

## 2021-09-02 DIAGNOSIS — U071 COVID-19: Secondary | ICD-10-CM | POA: Diagnosis present

## 2021-09-02 DIAGNOSIS — K219 Gastro-esophageal reflux disease without esophagitis: Secondary | ICD-10-CM | POA: Diagnosis present

## 2021-09-02 DIAGNOSIS — I480 Paroxysmal atrial fibrillation: Secondary | ICD-10-CM | POA: Diagnosis present

## 2021-09-02 DIAGNOSIS — E119 Type 2 diabetes mellitus without complications: Secondary | ICD-10-CM

## 2021-09-02 DIAGNOSIS — Z7901 Long term (current) use of anticoagulants: Secondary | ICD-10-CM

## 2021-09-02 DIAGNOSIS — J440 Chronic obstructive pulmonary disease with acute lower respiratory infection: Secondary | ICD-10-CM | POA: Diagnosis present

## 2021-09-02 DIAGNOSIS — Z794 Long term (current) use of insulin: Secondary | ICD-10-CM

## 2021-09-02 DIAGNOSIS — Z993 Dependence on wheelchair: Secondary | ICD-10-CM

## 2021-09-02 DIAGNOSIS — G2581 Restless legs syndrome: Secondary | ICD-10-CM | POA: Diagnosis present

## 2021-09-02 DIAGNOSIS — I9589 Other hypotension: Secondary | ICD-10-CM | POA: Diagnosis not present

## 2021-09-02 DIAGNOSIS — Z800 Family history of malignant neoplasm of digestive organs: Secondary | ICD-10-CM

## 2021-09-02 DIAGNOSIS — F17290 Nicotine dependence, other tobacco product, uncomplicated: Secondary | ICD-10-CM | POA: Diagnosis present

## 2021-09-02 LAB — CBC WITH DIFFERENTIAL/PLATELET
Abs Immature Granulocytes: 0.03 10*3/uL (ref 0.00–0.07)
Basophils Absolute: 0 10*3/uL (ref 0.0–0.1)
Basophils Relative: 0 %
Eosinophils Absolute: 0.1 10*3/uL (ref 0.0–0.5)
Eosinophils Relative: 1 %
HCT: 35.1 % — ABNORMAL LOW (ref 39.0–52.0)
Hemoglobin: 11.9 g/dL — ABNORMAL LOW (ref 13.0–17.0)
Immature Granulocytes: 0 %
Lymphocytes Relative: 4 %
Lymphs Abs: 0.3 10*3/uL — ABNORMAL LOW (ref 0.7–4.0)
MCH: 30.7 pg (ref 26.0–34.0)
MCHC: 33.9 g/dL (ref 30.0–36.0)
MCV: 90.7 fL (ref 80.0–100.0)
Monocytes Absolute: 0.7 10*3/uL (ref 0.1–1.0)
Monocytes Relative: 9 %
Neutro Abs: 6 10*3/uL (ref 1.7–7.7)
Neutrophils Relative %: 86 %
Platelets: 108 10*3/uL — ABNORMAL LOW (ref 150–400)
RBC: 3.87 MIL/uL — ABNORMAL LOW (ref 4.22–5.81)
RDW: 14.7 % (ref 11.5–15.5)
WBC: 7.1 10*3/uL (ref 4.0–10.5)
nRBC: 0 % (ref 0.0–0.2)

## 2021-09-02 LAB — I-STAT CHEM 8, ED
BUN: 14 mg/dL (ref 8–23)
Calcium, Ion: 0.95 mmol/L — ABNORMAL LOW (ref 1.15–1.40)
Chloride: 94 mmol/L — ABNORMAL LOW (ref 98–111)
Creatinine, Ser: 3.1 mg/dL — ABNORMAL HIGH (ref 0.61–1.24)
Glucose, Bld: 262 mg/dL — ABNORMAL HIGH (ref 70–99)
HCT: 36 % — ABNORMAL LOW (ref 39.0–52.0)
Hemoglobin: 12.2 g/dL — ABNORMAL LOW (ref 13.0–17.0)
Potassium: 3.8 mmol/L (ref 3.5–5.1)
Sodium: 137 mmol/L (ref 135–145)
TCO2: 29 mmol/L (ref 22–32)

## 2021-09-02 LAB — COMPREHENSIVE METABOLIC PANEL
ALT: 11 U/L (ref 0–44)
AST: 20 U/L (ref 15–41)
Albumin: 3.3 g/dL — ABNORMAL LOW (ref 3.5–5.0)
Alkaline Phosphatase: 129 U/L — ABNORMAL HIGH (ref 38–126)
Anion gap: 14 (ref 5–15)
BUN: 13 mg/dL (ref 8–23)
CO2: 29 mmol/L (ref 22–32)
Calcium: 8.2 mg/dL — ABNORMAL LOW (ref 8.9–10.3)
Chloride: 96 mmol/L — ABNORMAL LOW (ref 98–111)
Creatinine, Ser: 3.24 mg/dL — ABNORMAL HIGH (ref 0.61–1.24)
GFR, Estimated: 20 mL/min — ABNORMAL LOW (ref >60)
Glucose, Bld: 265 mg/dL — ABNORMAL HIGH (ref 70–99)
Potassium: 3.8 mmol/L (ref 3.5–5.1)
Sodium: 139 mmol/L (ref 135–145)
Total Bilirubin: 0.6 mg/dL (ref 0.3–1.2)
Total Protein: 6.3 g/dL — ABNORMAL LOW (ref 6.5–8.1)

## 2021-09-02 LAB — RESP PANEL BY RT-PCR (FLU A&B, COVID) ARPGX2
Influenza A by PCR: NEGATIVE
Influenza B by PCR: NEGATIVE
SARS Coronavirus 2 by RT PCR: POSITIVE — AB

## 2021-09-02 LAB — TROPONIN I (HIGH SENSITIVITY): Troponin I (High Sensitivity): 26 ng/L — ABNORMAL HIGH (ref <18)

## 2021-09-02 LAB — GLUCOSE, CAPILLARY: Glucose-Capillary: 152 mg/dL — ABNORMAL HIGH (ref 70–99)

## 2021-09-02 LAB — LACTIC ACID, PLASMA: Lactic Acid, Venous: 4.1 mmol/L (ref 0.5–1.9)

## 2021-09-02 MED ORDER — NIRMATRELVIR/RITONAVIR (PAXLOVID) TABLET (RENAL DOSING): 2.0000 | ORAL_TABLET | Freq: Two times a day (BID) | ORAL | Status: DC

## 2021-09-02 MED ORDER — APIXABAN 5 MG PO TABS: 5.0000 mg | ORAL_TABLET | ORAL | Status: DC

## 2021-09-02 MED ORDER — ACETAMINOPHEN 325 MG PO TABS
650.0000 mg | ORAL_TABLET | Freq: Four times a day (QID) | ORAL | Status: DC | PRN
  Administered 2021-09-02 – 2021-09-03 (×2): 650 mg via ORAL
  Administered 2021-09-04: 05:00:00 325 mg via ORAL
  Administered 2021-09-05: 21:00:00 650 mg via ORAL
  Filled 2021-09-02 (×4): qty 2

## 2021-09-02 MED ORDER — SODIUM CHLORIDE 0.9 % IV SOLN
500.0000 mg | Freq: Once | INTRAVENOUS | Status: AC
  Administered 2021-09-02: 22:00:00 500 mg via INTRAVENOUS
  Filled 2021-09-02: qty 5

## 2021-09-02 MED ORDER — PANTOPRAZOLE SODIUM 40 MG PO TBEC
40.0000 mg | DELAYED_RELEASE_TABLET | Freq: Every day | ORAL | Status: DC
  Administered 2021-09-03 – 2021-09-07 (×5): 40 mg via ORAL
  Filled 2021-09-02 (×5): qty 1

## 2021-09-02 MED ORDER — INSULIN GLARGINE-YFGN 100 UNIT/ML ~~LOC~~ SOLN
10.0000 [IU] | Freq: Every day | SUBCUTANEOUS | Status: DC
  Administered 2021-09-03 – 2021-09-07 (×6): 10 [IU] via SUBCUTANEOUS
  Filled 2021-09-02 (×6): qty 0.1

## 2021-09-02 MED ORDER — PREGABALIN 75 MG PO CAPS
75.0000 mg | ORAL_CAPSULE | Freq: Every day | ORAL | Status: DC
  Administered 2021-09-03 – 2021-09-05 (×4): 75 mg via ORAL
  Filled 2021-09-02 (×4): qty 1

## 2021-09-02 MED ORDER — FAMOTIDINE 20 MG PO TABS
40.0000 mg | ORAL_TABLET | Freq: Every day | ORAL | Status: DC
  Administered 2021-09-03 – 2021-09-04 (×2): 40 mg via ORAL
  Filled 2021-09-02 (×2): qty 2

## 2021-09-02 MED ORDER — VANCOMYCIN HCL IN DEXTROSE 1-5 GM/200ML-% IV SOLN: 1000.0000 mg | Freq: Once | INTRAVENOUS | Status: DC

## 2021-09-02 MED ORDER — TRAZODONE HCL 50 MG PO TABS
50.0000 mg | ORAL_TABLET | Freq: Once | ORAL | Status: DC
  Filled 2021-09-02: qty 1

## 2021-09-02 MED ORDER — SODIUM CHLORIDE 0.9 % IV SOLN
200.0000 mg | Freq: Once | INTRAVENOUS | Status: AC
  Administered 2021-09-03: 200 mg via INTRAVENOUS
  Filled 2021-09-02: qty 40

## 2021-09-02 MED ORDER — METRONIDAZOLE 500 MG/100ML IV SOLN
500.0000 mg | Freq: Two times a day (BID) | INTRAVENOUS | Status: DC
  Administered 2021-09-03 – 2021-09-04 (×3): 500 mg via INTRAVENOUS
  Filled 2021-09-02 (×3): qty 100

## 2021-09-02 MED ORDER — VANCOMYCIN HCL 2000 MG/400ML IV SOLN
2000.0000 mg | Freq: Once | INTRAVENOUS | Status: AC
  Administered 2021-09-03: 02:00:00 2000 mg via INTRAVENOUS
  Filled 2021-09-02: qty 400

## 2021-09-02 MED ORDER — ROPINIROLE HCL 1 MG PO TABS
2.0000 mg | ORAL_TABLET | Freq: Every day | ORAL | Status: DC
  Administered 2021-09-03 – 2021-09-07 (×6): 2 mg via ORAL
  Filled 2021-09-02 (×6): qty 2

## 2021-09-02 MED ORDER — INSULIN ASPART 100 UNIT/ML IJ SOLN
0.0000 [IU] | Freq: Three times a day (TID) | INTRAMUSCULAR | Status: DC
  Administered 2021-09-03 (×2): 1 [IU] via SUBCUTANEOUS
  Administered 2021-09-04: 13:00:00 2 [IU] via SUBCUTANEOUS
  Administered 2021-09-05: 17:00:00 1 [IU] via SUBCUTANEOUS
  Administered 2021-09-06: 18:00:00 5 [IU] via SUBCUTANEOUS
  Administered 2021-09-06: 12:00:00 2 [IU] via SUBCUTANEOUS

## 2021-09-02 MED ORDER — OMEGA-3-ACID ETHYL ESTERS 1 G PO CAPS
2.0000 g | ORAL_CAPSULE | Freq: Two times a day (BID) | ORAL | Status: DC
  Administered 2021-09-03 – 2021-09-07 (×11): 2 g via ORAL
  Filled 2021-09-02 (×11): qty 2

## 2021-09-02 MED ORDER — SODIUM CHLORIDE 0.9 % IV SOLN
1.0000 g | Freq: Once | INTRAVENOUS | Status: AC
  Administered 2021-09-02: 21:00:00 1 g via INTRAVENOUS
  Filled 2021-09-02: qty 10

## 2021-09-02 MED ORDER — OXYCODONE-ACETAMINOPHEN 7.5-325 MG PO TABS
1.0000 | ORAL_TABLET | Freq: Three times a day (TID) | ORAL | Status: DC | PRN
  Administered 2021-09-03 – 2021-09-07 (×11): 1 via ORAL
  Filled 2021-09-02 (×11): qty 1

## 2021-09-02 MED ORDER — HYDRALAZINE HCL 20 MG/ML IJ SOLN
10.0000 mg | INTRAMUSCULAR | Status: DC | PRN
  Administered 2021-09-03 – 2021-09-06 (×3): 10 mg via INTRAVENOUS
  Filled 2021-09-02 (×6): qty 1

## 2021-09-02 MED ORDER — ATORVASTATIN CALCIUM 40 MG PO TABS
40.0000 mg | ORAL_TABLET | Freq: Every day | ORAL | Status: DC
  Administered 2021-09-03 – 2021-09-07 (×6): 40 mg via ORAL
  Filled 2021-09-02 (×6): qty 1

## 2021-09-02 MED ORDER — SODIUM CHLORIDE 0.9 % IV BOLUS
500.0000 mL | Freq: Once | INTRAVENOUS | Status: AC
  Administered 2021-09-02: 20:00:00 500 mL via INTRAVENOUS

## 2021-09-02 MED ORDER — LORATADINE 10 MG PO TABS
10.0000 mg | ORAL_TABLET | Freq: Every day | ORAL | Status: DC
  Administered 2021-09-03 – 2021-09-07 (×5): 10 mg via ORAL
  Filled 2021-09-02 (×5): qty 1

## 2021-09-02 MED ORDER — APIXABAN 5 MG PO TABS
5.0000 mg | ORAL_TABLET | Freq: Two times a day (BID) | ORAL | Status: DC
  Administered 2021-09-03 – 2021-09-07 (×11): 5 mg via ORAL
  Filled 2021-09-02 (×11): qty 1

## 2021-09-02 MED ORDER — TAMSULOSIN HCL 0.4 MG PO CAPS
0.4000 mg | ORAL_CAPSULE | Freq: Every day | ORAL | Status: DC
  Administered 2021-09-03 – 2021-09-07 (×5): 0.4 mg via ORAL
  Filled 2021-09-02 (×5): qty 1

## 2021-09-02 MED ORDER — SODIUM CHLORIDE 0.9 % IV SOLN
100.0000 mg | Freq: Every day | INTRAVENOUS | Status: DC
  Administered 2021-09-03 – 2021-09-04 (×2): 100 mg via INTRAVENOUS
  Filled 2021-09-02 (×2): qty 20

## 2021-09-02 MED ORDER — FENTANYL 50 MCG/HR TD PT72
1.0000 | MEDICATED_PATCH | TRANSDERMAL | Status: DC
  Administered 2021-09-04 – 2021-09-07 (×2): 1 via TRANSDERMAL
  Filled 2021-09-02 (×2): qty 1

## 2021-09-02 MED ORDER — LINACLOTIDE 145 MCG PO CAPS: 145.0000 ug | ORAL_CAPSULE | Freq: Every day | ORAL | Status: DC | PRN

## 2021-09-02 MED ORDER — SODIUM CHLORIDE 0.9 % IV SOLN
2.0000 g | Freq: Once | INTRAVENOUS | Status: AC
  Administered 2021-09-03: 01:00:00 2 g via INTRAVENOUS
  Filled 2021-09-02: qty 2

## 2021-09-02 NOTE — ED Provider Notes (Addendum)
[facility] EMERGENCY DEPARTMENT Provider Note   CSN: [id number] Arrival date & time: 09/02/21  1640     History Chief Complaint  Patient presents with   Altered Mental Status    [name] is a 65 y.o. male history of CKD on dialysis, diabetes, hyperlipidemia, hypertension who presented with altered mental status.  Patient was at dialysis and finished three quarter of dialysis session.  He suddenly became altered.  He was noted to be febrile of 103 and tachycardic and hypoxic.  Patient was put on 2 L nasal cannula.  Patient also was given Tylenol prior to arrival. Had COVID vaccine and booster.   The history is provided by the patient.      Past Medical History:  Diagnosis Date   Carotid artery occlusion 11/10/10   LEFT CAROTID ENDARTERECTOMY   Chronic kidney disease    Complication of anesthesia    BP WENT UP AT [facility] "   COPD (chronic obstructive pulmonary disease) ([place])    pt denies this dx as of 06/01/20 - no inhaler    Diabetes mellitus without complication ([place])    Diverticulitis    Diverticulosis of colon (without mention of hemorrhage)    DJD (degenerative joint disease)    knees/hands/feet/back/neck   Fatty liver    Full dentures    GERD (gastroesophageal reflux disease)    H/O hiatal hernia    History of blood transfusion    with a past surical procedure per patient 06/01/20   Hyperlipidemia    Hypertension    Neuromuscular disorder ([place])    peripheral neuropathy   Non-pressure chronic ulcer of other part of left foot limited to breakdown of skin ([place]) 11/12/2016   Osteomyelitis (HCC)    left 5th metatarsal   PAD (peripheral artery disease) ([place])    Distal aortogram June 2012. Atherectomy left popliteal artery July 2012.    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   Unstable angina ([place]) 09/16/2018   Wears glasses      Patient Active Problem List   Diagnosis Date Noted   Severe sepsis with lactic acidosis ([place]) 06/14/2021   Infection associated with implanted penile prosthesis ([place]) 06/14/2021   ESRD on hemodialysis ([place]) 06/14/2021   Paroxysmal atrial fibrillation with RVR ([place]) 06/14/2021   Carotid stenosis 06/15/2020   Dyslipidemia 06/15/2020   Severe nonproliferative diabetic retinopathy of right eye, with macular edema, associated with type 2 diabetes mellitus ([place]) 01/18/2020   Severe nonproliferative diabetic retinopathy of left eye, with macular edema, associated with type 2 diabetes mellitus ([place]) 01/04/2020   Retinal hemorrhage of right eye 01/04/2020   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   Pseudoclaudication 11/15/2018   Essential hypertension 09/18/2018   S/P BKA (below knee amputation), right ([place]) 09/18/2018   Amputation of toe of left foot ([place]) 09/18/2018   Peripheral artery disease ([place]) 09/18/2018   S/P carotid endarterectomy 09/18/2018   OSA on CPAP 09/18/2018   Chronic back pain 09/18/2018   Status post reversal of ileostomy 05/21/2018   Normocytic anemia 02/14/2018   Intra-abdominal abscess ([place]) 11/04/2017   Chronic venous hypertension (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 ([place]) 12/11/2016   Ischemic ulcer of left foot, limited to breakdown of skin ([place]) 11/12/2016   Unilateral primary osteoarthritis, left knee 10/11/2016   History of right below  knee amputation ([place]) 07/12/2016   Diabetic foot infection ([place]) 05/04/2016   Insulin dependent type 2 diabetes mellitus ([place]) 12/16/2015   Amputated toe ([place]) 10/21/2015   Leg edema, left 09/03/2015   CAD (dz of distal, mid and proximal RCA with implantation of 3 overlapping drug-eluting stent,) 09/03/2015   Chronic diastolic heart failure, NYHA class 2 ([place]) 09/03/2015   Angina pectoris associated with type 2 diabetes mellitus ([place]) 10/28/2014   Hyperlipidemia 08/25/2014   Limb pain 03/20/2013   DJD  (degenerative joint disease) 09/25/2012   Migraine 09/25/2012   Neuropathy 09/25/2012   Restless legs syndrome (RLS) 09/25/2012   Chronic obstructive pulmonary disease, unspecified ([place]) 04/25/2012   Unknown cause of morbidity or mortality 04/25/2012   Chronic total occlusion of artery of the extremities ([place]) 04/08/2012   Onychomycosis 02/01/2012   Occlusion and stenosis of carotid artery without mention of cerebral infarction 06/12/2011   GERD (gastroesophageal reflux disease) 05/08/2011   Barrett's esophagus without dysplasia 05/08/2011   Former tobacco use 02/01/2011    Past Surgical History:  Procedure Laterality Date   ABDOMINAL AORTOGRAM W/LOWER EXTREMITY N/A 06/23/2021   Procedure: ABDOMINAL AORTOGRAM W/LOWER EXTREMITY;  Surgeon: Nigel Mormon, MD;  Location: [facility];  Service: Cardiovascular;  Laterality: N/A;   AMPUTATION  11/05/2011   Procedure: AMPUTATION RAY;  Surgeon: Wylene Simmer, MD;  Location: [facility];  Service: Orthopedics;  Laterality: Right;  Amputation of Right 4&5th Toes   AMPUTATION Left 11/26/2012   Procedure: AMPUTATION RAY;  Surgeon: Wylene Simmer, MD;  Location: [facility];  Service: Orthopedics;  Laterality: Left;  fourth ray amputation   AMPUTATION Right 08/27/2014   Procedure: Transmetatarsal Amputation;  Surgeon: Newt Minion, MD;  Location: [facility];  Service: Orthopedics;  Laterality: Right;   AMPUTATION Right 01/14/2015   Procedure: AMPUTATION BELOW KNEE;  Surgeon: Newt Minion, MD;  Location: [facility];  Service: Orthopedics;  Laterality: Right;   AMPUTATION Left 10/21/2015   Procedure: Left Foot 5th Ray Amputation;  Surgeon: Newt Minion, MD;  Location: [facility];  Service: Orthopedics;  Laterality: Left;   ANTERIOR FUSION CERVICAL SPINE  02/06/06   C4-5, C5-6, C6-7; SURGEON DR. MAX COHEN   AV FISTULA PLACEMENT Left 06/02/2020   Procedure: ARTERIOVENOUS (AV) FISTULA CREATION LEFT;  Surgeon: Waynetta Sandy, MD;  Location: [facility];  Service:  Vascular;  Laterality: Left;   BACK SURGERY     x 3   [place] Left 07/21/2020   Procedure: LEFT UPPER ARM ATERIOVENOUS SUPERFISTULALIZATION;  Surgeon: Waynetta Sandy, MD;  Location: [facility];  Service: Vascular;  Laterality: Left;   BELOW KNEE LEG AMPUTATION Right    CARDIAC CATHETERIZATION  10/31/04   2009   CAROTID ENDARTERECTOMY  11/10/10   CAROTID ENDARTERECTOMY Left 11/10/2010   Subtotal occlusion of left internal carotid artery with left hemispheric transient ischemic attacks.   CAROTID STENT     CARPAL TUNNEL RELEASE Right 10/21/2013   Procedure: RIGHT CARPAL TUNNEL RELEASE;  Surgeon: Wynonia Sours, MD;  Location: [facility];  Service: Orthopedics;  Laterality: Right;   CHOLECYSTECTOMY     COLON SURGERY     COLONOSCOPY     COLOSTOMY REVERSAL  05/21/2018   ileostomy reversal   CYSTOSCOPY WITH STENT PLACEMENT Bilateral 01/13/2018   Procedure: CYSTOSCOPY WITH BILATERAL URETERAL CATHETER PLACEMENT;  Surgeon: Ardis Hughs, MD;  Location: [facility];  Service: Urology;  Laterality: Bilateral;   ESOPHAGEAL MANOMETRY Bilateral 07/19/2014   Procedure: ESOPHAGEAL  MANOMETRY (EM);  Surgeon: Jerene Bears, MD;  Location: [facility];  Service: Gastroenterology;  Laterality: Bilateral;   EYE SURGERY Bilateral 2020   cataract   FEMORAL ARTERY STENT     x6   FINGER SURGERY     FOOT SURGERY  04/25/2016    EXCISION BASE 5TH METATARSAL AND PARTIAL CUBOID LEFT FOOT   HERNIA REPAIR     LEFT INGUINAL AND UMBILICAL REPAIRS   HERNIA REPAIR     I & D EXTREMITY Left 04/25/2016   Procedure: EXCISION BASE 5TH METATARSAL AND PARTIAL CUBOID LEFT FOOT;  Surgeon: Newt Minion, MD;  Location: [facility];  Service: Orthopedics;  Laterality: Left;   ILEOSTOMY  01/13/2018   Procedure: ILEOSTOMY;  Surgeon: Clovis Riley, MD;  [facility];  Service: General;;   ILEOSTOMY CLOSURE N/A 05/21/2018   Procedure: ILEOSTOMY REVERSAL ERAS PATHWAY;  Surgeon: Clovis Riley, MD;   Location: [facility];  Service: General;  Laterality: N/A;   IR RADIOLOGIST EVAL & MGMT  11/19/2017   IR RADIOLOGIST EVAL & MGMT  12/03/2017   IR RADIOLOGIST EVAL & MGMT  12/18/2017   JOINT REPLACEMENT Right 2001   Total knee   LAMINECTOMY     X 3 LUMBAR AND X 2 CERVICAL SPINE OPERATIONS   LAPAROSCOPIC CHOLECYSTECTOMY W/ CHOLANGIOGRAPHY  11/09/04   SURGEON DR. Cobb CATH AND CORONARY ANGIOGRAPHY N/A 09/16/2018   Procedure: LEFT HEART CATH AND CORONARY ANGIOGRAPHY;  Surgeon: Nigel Mormon, MD;  Location: [facility];  Service: Cardiovascular;  Laterality: N/A;   LEFT HEART CATHETERIZATION WITH CORONARY ANGIOGRAM N/A 10/29/2014   Procedure: LEFT HEART CATHETERIZATION WITH CORONARY ANGIOGRAM;  Surgeon: Laverda Page, MD;  Location: [facility];  Service: Cardiovascular;  Laterality: N/A;   LIGATION OF COMPETING BRANCHES OF ARTERIOVENOUS FISTULA Left 07/21/2020   Procedure: LIGATION OF COMPETING BRANCHES OF LEFT UPPER ARM ARTERIOVENOUS FISTULA;  Surgeon: Waynetta Sandy, MD;  Location: [facility];  Service: Vascular;  Laterality: Left;   LOWER EXTREMITY ANGIOGRAM N/A 03/19/2012   Procedure: LOWER EXTREMITY ANGIOGRAM;  Surgeon: Burnell Blanks, MD;  Location: [facility] C[facility] LAB;  Service: Cardiovascular;  Laterality: N/A;   LOWER EXTREMITY ANGIOGRAPHY N/A 06/20/2021   Procedure: LOWER EXTREMITY ANGIOGRAPHY;  Surgeon: Nigel Mormon, MD;  Location: [facility];  Service: Cardiovascular;  Laterality: N/A;   NECK SURGERY     PARTIAL COLECTOMY N/A 01/13/2018   Procedure: LAPAROSCOPIC ASSISTED   SIGMOID COLECTOMY ILEOSTOMY;  Surgeon: Clovis Riley, MD;  [facility];  Service: General;  Laterality: N/A;   PENILE PROSTHESIS IMPLANT  08/14/05   INFRAPUBIC INSERTION OF INFLATABLE PENILE PROSTHESIS; SURGEON DR. Amalia Hailey   PENILE PROSTHESIS IMPLANT     PERCUTANEOUS CORONARY STENT INTERVENTION (PCI-S) Right 10/29/2014   Procedure: PERCUTANEOUS CORONARY STENT  INTERVENTION (PCI-S);  Surgeon: Laverda Page, MD;  Location: [facility];  Service: Cardiovascular;  Laterality: Right;   PERIPHERAL VASCULAR INTERVENTION Left 06/23/2021   Procedure: PERIPHERAL VASCULAR INTERVENTION;  Surgeon: Nigel Mormon, MD;  Location: [facility];  Service: Cardiovascular;  Laterality: Left;   REMOVAL OF PENILE PROSTHESIS N/A 06/14/2021   Procedure: Removal of THREE piece inflatable penile prosthesis;  Surgeon: Lucas Mallow, MD;  Location: [facility];  Service: Urology;  Laterality: N/A;   SHOULDER ARTHROSCOPY     SPINE SURGERY     TOE AMPUTATION Left    TONSILLECTOMY     TOTAL  KNEE ARTHROPLASTY  07/2002   RIGHT KNEE ; SURGEON  DR. [name] ALSO HAD ARTHROSCOPIC RIGHT KNEE IN  10/2001   TOTAL KNEE ARTHROPLASTY     ULNAR NERVE TRANSPOSITION Right 10/21/2013   Procedure: RIGHT ELBOW  ULNAR NERVE DECOMPRESSION;  Surgeon: Wynonia Sours, MD;  Location: [facility];  Service: Orthopedics;  Laterality: Right;       Family History  Problem Relation Age of Onset   Heart disease Father        Before age 69-  CAD, BPG   Diabetes Father        Amputation   Cancer Father        PROSTATE   Hyperlipidemia Father    Hypertension Father    Heart attack Father        Triple BPG   Varicose Veins Father    Colon cancer Brother    Diabetes Brother    Heart disease Brother 21       A-Fib. Before age 58   Hyperlipidemia Brother    Hypertension Brother    Cancer Sister        Breast   Hyperlipidemia Sister    Hypertension Sister    Hypertension Son    Arthritis Other        GRANDMOTHER   Hypertension Other        OTHER FAMILY MEMBERS    Social History   Tobacco Use   Smoking status: Former    Packs/day: 2.00    Years: 35.00    Pack years: 70.00    Types: Cigarettes    Quit date: 10/28/2011    Years since quitting: 9.8   Smokeless tobacco: Never  Vaping Use   Vaping Use: Some days   Substances: Nicotine  Substance Use Topics    Alcohol use: Not Currently    Comment: "not in a long time"   Drug use: Never    Home Medications Prior to Admission medications   Medication Sig Start Date End Date Taking? Authorizing Provider  amLODipine (NORVASC) 5 MG tablet Take 5 mg by mouth daily. 01/30/21   [provider]  apixaban (ELIQUIS) 5 MG TABS tablet Take 5 mg by mouth See admin instructions. Only take on non-dialysis days Monday,Wednesday,Friday,sunday    [provider]  atorvastatin (LIPITOR) 40 MG tablet Take 40 mg by mouth at bedtime.     [provider]  doxycycline (VIBRA-TABS) 100 MG tablet Take 1 tablet (100 mg total) by mouth 2 (two) times daily. 07/26/21   Suzan Slick, NP  famotidine (PEPCID) 40 MG tablet Take 40 mg by mouth daily. 04/29/21   [provider]  fentaNYL (DURAGESIC - DOSED MCG/HR) 50 MCG/HR Place 50 mcg onto the skin every 3 (three) days.    [provider]  folic acid-vitamin b complex-vitamin c-selenium-zinc (DIALYVITE) 3 MG TABS tablet Take 1 tablet by mouth daily.    [provider]  Glucosamine HCl (GLUCOSAMINE PO) Take 100 tablets by mouth 2 (two) times daily.    [provider]  HUMALOG KWIKPEN 100 UNIT/ML KiwkPen Inject 13 Units into the skin 3 (three) times daily. 11/14/17   [provider]  insulin glargine (LANTUS) 100 UNIT/ML injection Inject 0.1 mLs (10 Units total) into the skin at bedtime. 06/30/21   Antonieta Pert, MD  ketoconazole (NIZORAL) 2 % cream Apply 1 application topically 2 (two) times daily as needed for irritation.  03/07/18   [provider]  lidocaine-prilocaine (EMLA) cream Dialysis  days Tuesday,Thursday and saturday 06/06/21   [provider]  linaclotide Rolan Lipa) 145 MCG CAPS capsule Take 145 mcg by mouth daily as needed (constipation).     [provider]  loratadine (CLARITIN) 10 MG tablet Take 10 mg by mouth daily.    [provider]  NEEDLE, REUSABLE, 22 G 22G X  1-1/2" MISC 1 Units by Does not apply route as directed. For B12 IM inj 02/15/18   Elgergawy, Silver Huguenin, MD  nitroGLYCERIN (NITRODUR - DOSED IN MG/24 HR) 0.2 mg/hr patch Place 1 patch (0.2 mg total) onto the skin daily. 06/12/21   Newt Minion, MD  omega-3 acid ethyl esters (LOVAZA) 1 g capsule Take 2 g by mouth 2 (two) times daily.    [provider]  ondansetron (ZOFRAN) 4 MG tablet Take 4 mg by mouth every 6 (six) hours as needed for nausea or vomiting.    [provider]  oxyCODONE-acetaminophen (PERCOCET) 7.5-325 MG tablet Take 1 tablet by mouth 3 (three) times daily as needed for moderate pain. 12/07/20   [provider]  pantoprazole (PROTONIX) 40 MG tablet Take 40 mg by mouth daily.    [provider]  Polyvinyl Alcohol-Povidone PF 1.4-0.6 % SOLN Place 1 drop into both eyes 2 (two) times daily as needed (dry eyes).     [provider]  pregabalin (LYRICA) 75 MG capsule Take 1 capsule (75 mg total) by mouth daily for 14 days. 06/30/21 07/17/21  Antonieta Pert, MD  rOPINIRole (REQUIP) 2 MG tablet Take 2 mg by mouth at bedtime.    [provider]  Syringe/Needle, Disp, (SYRINGE 3CC/22GX1-1/2") 22G X 1-1/2" 3 ML MISC 1 Syringe by Does not apply route as directed. For b12 IM inj 02/15/18   Elgergawy, Silver Huguenin, MD  tamsulosin (FLOMAX) 0.4 MG CAPS capsule Take 0.4 mg by mouth daily.    [provider]    Allergies    Contrast media [iodinated contrast media], Ivp dye [iodinated contrast media], and Adhesive [tape]  Review of Systems   Review of Systems  Constitutional:  Positive for fever.  Neurological:  Positive for dizziness and weakness.  Psychiatric/Behavioral:  Positive for confusion.   All other systems reviewed and are negative.  Physical Exam Updated Vital Signs BP (!) 110/40    Pulse (!) 117    Temp (!) 103.1 F (39.5 C) (Oral)    Resp 20    Ht 6\' 2"  (1.88 m)    Wt 108.9 kg    SpO2 98%    BMI 30.81 kg/m   Physical  Exam Vitals and nursing note reviewed.  Constitutional:      Comments: Slightly confused, chronically ill  HENT:     Head: Normocephalic.     Nose: Nose normal.     Mouth/Throat:     Mouth: Mucous membranes are dry.  Eyes:     Extraocular Movements: Extraocular movements intact.     Pupils: Pupils are equal, round, and reactive to light.  Cardiovascular:     Rate and Rhythm: Normal rate and regular rhythm.     Pulses: Normal pulses.     Heart sounds: Normal heart sounds.  Pulmonary:     Effort: Pulmonary effort is normal.     Comments: Crackles bilateral bases Abdominal:     General: Abdomen is flat.     Palpations: Abdomen is soft.  Musculoskeletal:        General: Normal range of motion.     Cervical back: Normal  range of motion and neck supple.  Skin:    General: Skin is warm.     Capillary Refill: Capillary refill takes less than 2 seconds.  Neurological:     Comments: Slightly slow to respond, moving all extremities  Psychiatric:        Mood and Affect: Mood normal.        Behavior: Behavior normal.    ED Results / Procedures / Treatments   Labs (all labs ordered are listed, but only abnormal results are displayed) Labs Reviewed  RESP PANEL BY RT-PCR (FLU A&B, COVID) ARPGX2 - Abnormal; Notable for the following components:      Result Value   SARS Coronavirus 2 by RT PCR POSITIVE (*)    All other components within normal limits  CBC WITH DIFFERENTIAL/PLATELET - Abnormal; Notable for the following components:   RBC 3.87 (*)    Hemoglobin 11.9 (*)    HCT 35.1 (*)    Platelets 108 (*)    Lymphs Abs 0.3 (*)    All other components within normal limits  COMPREHENSIVE METABOLIC PANEL - Abnormal; Notable for the following components:   Chloride 96 (*)    Glucose, Bld 265 (*)    Creatinine, Ser 3.24 (*)    Calcium 8.2 (*)    Total Protein 6.3 (*)    Albumin 3.3 (*)    Alkaline Phosphatase 129 (*)    GFR, Estimated 20 (*)    All other components within normal  limits  LACTIC ACID, PLASMA - Abnormal; Notable for the following components:   Lactic Acid, Venous 4.1 (*)    All other components within normal limits  I-STAT CHEM 8, ED - Abnormal; Notable for the following components:   Chloride 94 (*)    Creatinine, Ser 3.10 (*)    Glucose, Bld 262 (*)    Calcium, Ion 0.95 (*)    Hemoglobin 12.2 (*)    HCT 36.0 (*)    All other components within normal limits  TROPONIN I (HIGH SENSITIVITY) - Abnormal; Notable for the following components:   Troponin I (High Sensitivity) 26 (*)    All other components within normal limits  CULTURE, BLOOD (ROUTINE X 2)  CULTURE, BLOOD (ROUTINE X 2)  LACTIC ACID, PLASMA  TROPONIN I (HIGH SENSITIVITY)    EKG None  Radiology DG Chest Port 1 View  Result Date: 09/02/2021 CLINICAL DATA:  Altered mental status, fever EXAM: PORTABLE CHEST 1 VIEW COMPARISON:  June 13, 2021 FINDINGS: The heart size and mediastinal contours are within normal limits. Aortic atherosclerosis. Hazy bibasilar opacities may reflect atelectasis or infiltrate. No visible pleural effusion or pneumothorax. Partially visualized cervical fusion hardware. IMPRESSION: Hazy bibasilar opacities may reflect atelectasis or infiltrate. Electronically Signed   By: Dahlia Bailiff M.D.   On: 09/02/2021 17:27    Procedures Procedures   CRITICAL CARE Performed by: Wandra Arthurs   Total critical care time: 30 minutes  Critical care time was exclusive of separately billable procedures and treating other patients.  Critical care was necessary to treat or prevent imminent or life-threatening deterioration.  Critical care was time spent personally by me on the following activities: development of treatment plan with patient and/or surrogate as well as nursing, discussions with consultants, evaluation of patient's response to treatment, examination of patient, obtaining history from patient or surrogate, ordering and performing treatments and interventions,  ordering and review of laboratory studies, ordering and review of radiographic studies, pulse oximetry and re-evaluation of patient's condition.  Angiocath  insertion Performed by: Wandra Arthurs  Consent: Verbal consent obtained. Risks and benefits: risks, benefits and alternatives were discussed Time out: Immediately prior to procedure a "time out" was called to verify the correct patient, procedure, equipment, support staff and site/side marked as required.  Preparation: Patient was prepped and draped in the usual sterile fashion.  Vein Location:R antecube  Ultrasound Guided  Gauge: 20 long   Normal blood return and flush without difficulty Patient tolerance: Patient tolerated the procedure well with no immediate complications.    Medications Ordered in ED Medications  sodium chloride 0.9 % bolus 500 mL (has no administration in time range)  cefTRIAXone (ROCEPHIN) 1 g in sodium chloride 0.9 % 100 mL IVPB (has no administration in time range)  azithromycin (ZITHROMAX) 500 mg in sodium chloride 0.9 % 250 mL IVPB (has no administration in time range)  nirmatrelvir/ritonavir EUA (renal dosing) (PAXLOVID) 2 tablet (has no administration in time range)    ED Course  I have reviewed the triage vital signs and the nursing notes.  Pertinent labs & imaging results that were available during my care of the patient were reviewed by me and considered in my medical decision making (see chart for details).    MDM Rules/Calculators/A&P                         [name] is a 65 y.o. male here with AMS, fever. Febrile 103 prior to arrival. Consider pneumonia vs COVID vs sepsis. He doesn't urinate. Will get cbc, cmp, lactate, cultures, CXR, COVID   7:59 PM COVID positive. CXR showed possible pneumonia. Still tachycardic. No O2 requirement. Given paxlovid, rocephin, azithromycin. Hospitalist to admit for confusion from COVID, pneumonia, sepsis.   8:21 PM Pharmacy called. Due to multiple  drug interactions, they recommend remdesivir.     Final Clinical Impression(s) / ED Diagnoses Final diagnoses:  None    Rx / DC Orders ED Discharge Orders     None        Drenda Freeze, MD 09/02/21 Despina Pole    Drenda Freeze, MD 09/02/21 2030

## 2021-09-02 NOTE — ED Notes (Signed)
Wife [name] [phone] would like an udpate

## 2021-09-02 NOTE — H&P (Addendum)
History and Physical    [name]:[id number] DOB: [birth year] DOA: 09/02/2021  PCP: [name], PA-C  Patient coming from: Home.  Chief Complaint: Fever and confusion.  HPI: [name] is a 65 y.o. male with history of ESRD on hemodialysis, atrial fibrillation, diabetes mellitus type 2 while in the dialysis was found to be confused and febrile and having rigors was brought to the ER.  Patient states he did not have any productive cough nausea vomiting diarrhea.  Patient does have a chronic wound on the left lower extremity which patient had recently followed up with [name].  He also has a rash on the back which patient states is chronic but has recently increased.  Denies any penile discharge or pain around the groin.  Patient was admitted in October for sepsis secondary to penile implant infection which was removed.  At that time patient also had left lower extremity wound infection for which patient underwent left lower extremity revascularization procedure by [name].  ED Course: In the ER patient was initially hypotensive with a blood pressure in the 91 systolic with heart rate in the 130s temperature 103.  Patient's vital signs improved with 5 cc normal saline bolus.  Patient had blood culture drawn.  Patient COVID test came back positive.  Was started on remdesivir.  Patient's patient was not hypoxic was not started on steroids.  Lactic acid improved from 4.1-1.5 after fluid bolus.  Chest x-ray did show infiltrates.  Review of Systems: As per HPI, rest all negative.   Past Medical History:  Diagnosis Date   Carotid artery occlusion 11/10/10   LEFT CAROTID ENDARTERECTOMY   Chronic kidney disease    Complication of anesthesia    BP WENT UP AT [facility] "   COPD (chronic obstructive pulmonary disease) ([place])    pt denies this dx as of 06/01/20 - no inhaler    Diabetes mellitus without complication ([place])    Diverticulitis    Diverticulosis of colon (without mention of  hemorrhage)    DJD (degenerative joint disease)    knees/hands/feet/back/neck   Fatty liver    Full dentures    GERD (gastroesophageal reflux disease)    H/O hiatal hernia    History of blood transfusion    with a past surical procedure per patient 06/01/20   Hyperlipidemia    Hypertension    Neuromuscular disorder ([place])    peripheral neuropathy   Non-pressure chronic ulcer of other part of left foot limited to breakdown of skin ([place]) 11/12/2016   Osteomyelitis (HCC)    left 5th metatarsal   PAD (peripheral artery disease) ([place])    Distal aortogram June 2012. Atherectomy left popliteal artery July 2012.    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   Unstable angina ([place]) 09/16/2018   Wears glasses     Past Surgical History:  Procedure Laterality Date   ABDOMINAL AORTOGRAM W/LOWER EXTREMITY N/A 06/23/2021   Procedure: ABDOMINAL AORTOGRAM W/LOWER EXTREMITY;  Surgeon: Nigel Mormon, MD;  Location: [place] [facility];  Service: Cardiovascular;  Laterality: N/A;   AMPUTATION  11/05/2011   Procedure: AMPUTATION RAY;  Surgeon: Wylene Simmer, MD;  Location: [place];  Service: Orthopedics;  Laterality: Right;  Amputation of Right 4&5th Toes   AMPUTATION Left 11/26/2012   Procedure: AMPUTATION RAY;  Surgeon: Jenny Reichmann  [name], MD;  Location: [place];  Service: Orthopedics;  Laterality: Left;  fourth ray amputation   AMPUTATION Right 08/27/2014   Procedure: Transmetatarsal Amputation;  Surgeon: Newt Minion, MD;  Location: [place];  Service: Orthopedics;  Laterality: Right;   AMPUTATION Right 01/14/2015   Procedure: AMPUTATION BELOW KNEE;  Surgeon: Newt Minion, MD;  Location: [facility];  Service: Orthopedics;  Laterality: Right;   AMPUTATION Left 10/21/2015   Procedure: Left Foot 5th Ray Amputation;  Surgeon: Newt Minion, MD;  Location: [place];  Service: Orthopedics;  Laterality: Left;    ANTERIOR FUSION CERVICAL SPINE  02/06/06   C4-5, C5-6, C6-7; SURGEON DR. MAX COHEN   AV FISTULA PLACEMENT Left 06/02/2020   Procedure: ARTERIOVENOUS (AV) FISTULA CREATION LEFT;  Surgeon: Waynetta Sandy, MD;  Location: [facility];  Service: Vascular;  Laterality: Left;   BACK SURGERY     x 3   [place] Left 07/21/2020   Procedure: LEFT UPPER ARM ATERIOVENOUS SUPERFISTULALIZATION;  Surgeon: Waynetta Sandy, MD;  Location: [place];  Service: Vascular;  Laterality: Left;   BELOW KNEE LEG AMPUTATION Right    CARDIAC CATHETERIZATION  10/31/04   2009   CAROTID ENDARTERECTOMY  11/10/10   CAROTID ENDARTERECTOMY Left 11/10/2010   Subtotal occlusion of left internal carotid artery with left hemispheric transient ischemic attacks.   CAROTID STENT     CARPAL TUNNEL RELEASE Right 10/21/2013   Procedure: RIGHT CARPAL TUNNEL RELEASE;  Surgeon: Wynonia Sours, MD;  Location: [place];  Service: Orthopedics;  Laterality: Right;   CHOLECYSTECTOMY     COLON SURGERY     COLONOSCOPY     COLOSTOMY REVERSAL  05/21/2018   ileostomy reversal   CYSTOSCOPY WITH STENT PLACEMENT Bilateral 01/13/2018   Procedure: CYSTOSCOPY WITH BILATERAL URETERAL CATHETER PLACEMENT;  Surgeon: Ardis Hughs, MD;  Location[facility];  Service: Urology;  Laterality: Bilateral;   ESOPHAGEAL MANOMETRY Bilateral 07/19/2014   Procedure: ESOPHAGEAL MANOMETRY (EM);  Surgeon: Jerene Bears, MD;  [facility];  Service: Gastroenterology;  Laterality: Bilateral;   EYE SURGERY Bilateral 2020   cataract   FEMORAL ARTERY STENT     x6   FINGER SURGERY     FOOT SURGERY  04/25/2016    EXCISION BASE 5TH METATARSAL AND PARTIAL CUBOID LEFT FOOT   HERNIA REPAIR     LEFT INGUINAL AND UMBILICAL REPAIRS   HERNIA REPAIR     I & D EXTREMITY Left 04/25/2016   Procedure: EXCISION BASE 5TH METATARSAL AND PARTIAL CUBOID LEFT FOOT;  Surgeon: Newt Minion, MD;  Location: [place] [facility];  Service: Orthopedics;   Laterality: Left;   ILEOSTOMY  01/13/2018   Procedure: ILEOSTOMY;  Surgeon: Clovis Riley, MD;  [facility];  Service: General;;   ILEOSTOMY CLOSURE N/A 05/21/2018   Procedure: ILEOSTOMY REVERSAL ERAS PATHWAY;  Surgeon: Clovis Riley, MD;  Location: [facility];  Service: General;  Laterality: N/A;   IR RADIOLOGIST EVAL & MGMT  11/19/2017   IR RADIOLOGIST EVAL & MGMT  12/03/2017   IR RADIOLOGIST EVAL & MGMT  12/18/2017   JOINT REPLACEMENT Right 2001   Total knee   LAMINECTOMY     X 3 LUMBAR AND X 2 CERVICAL SPINE OPERATIONS   LAPAROSCOPIC CHOLECYSTECTOMY W/ CHOLANGIOGRAPHY  11/09/04   SURGEON DR. Clifton CATH AND CORONARY ANGIOGRAPHY N/A 09/16/2018   Procedure: LEFT HEART CATH AND CORONARY ANGIOGRAPHY;  Surgeon: Nigel Mormon, MD;  Location: [facility];  Service: Cardiovascular;  Laterality: N/A;   LEFT HEART CATHETERIZATION WITH CORONARY ANGIOGRAM N/A 10/29/2014   Procedure: LEFT HEART CATHETERIZATION WITH CORONARY ANGIOGRAM;  Surgeon: Laverda Page, MD;  Location: [facility];  Service: Cardiovascular;  Laterality: N/A;   LIGATION OF COMPETING BRANCHES OF ARTERIOVENOUS FISTULA Left 07/21/2020   Procedure: LIGATION OF COMPETING BRANCHES OF LEFT UPPER ARM ARTERIOVENOUS FISTULA;  Surgeon: Waynetta Sandy, MD;  Location: [place];  Service: Vascular;  Laterality: Left;   LOWER EXTREMITY ANGIOGRAM N/A 03/19/2012   Procedure: LOWER EXTREMITY ANGIOGRAM;  Surgeon: Burnell Blanks, MD;  Location: [facility];  Service: Cardiovascular;  Laterality: N/A;   LOWER EXTREMITY ANGIOGRAPHY N/A 06/20/2021   Procedure: LOWER EXTREMITY ANGIOGRAPHY;  Surgeon: Nigel Mormon, MD;  Location: [place] [facility];  Service: Cardiovascular;  Laterality: N/A;   NECK SURGERY     PARTIAL COLECTOMY N/A 01/13/2018   Procedure: LAPAROSCOPIC ASSISTED   SIGMOID COLECTOMY ILEOSTOMY;  Surgeon: Clovis Riley, MD;  [facility];  Service: General;  Laterality: N/A;    PENILE PROSTHESIS IMPLANT  08/14/05   INFRAPUBIC INSERTION OF INFLATABLE PENILE PROSTHESIS; SURGEON DR. Amalia Hailey   PENILE PROSTHESIS IMPLANT     PERCUTANEOUS CORONARY STENT INTERVENTION (PCI-S) Right 10/29/2014   Procedure: PERCUTANEOUS CORONARY STENT INTERVENTION (PCI-S);  Surgeon: Laverda Page, MD;  Location: [facility];  Service: Cardiovascular;  Laterality: Right;   PERIPHERAL VASCULAR INTERVENTION Left 06/23/2021   Procedure: PERIPHERAL VASCULAR INTERVENTION;  Surgeon: Nigel Mormon, MD;  Location: [place] [facility];  Service: Cardiovascular;  Laterality: Left;   REMOVAL OF PENILE PROSTHESIS N/A 06/14/2021   Procedure: Removal of THREE piece inflatable penile prosthesis;  Surgeon: Lucas Mallow, MD;  Location: [facility];  Service: Urology;  Laterality: N/A;   SHOULDER ARTHROSCOPY     SPINE SURGERY     TOE AMPUTATION Left    TONSILLECTOMY     TOTAL KNEE ARTHROPLASTY  07/2002   RIGHT KNEE ; SURGEON  DR. [name] ALSO HAD ARTHROSCOPIC RIGHT KNEE IN  10/2001   TOTAL KNEE ARTHROPLASTY     ULNAR NERVE TRANSPOSITION Right 10/21/2013   Procedure: RIGHT ELBOW  ULNAR NERVE DECOMPRESSION;  Surgeon: Wynonia Sours, MD;  Location: [facility];  Service: Orthopedics;  Laterality: Right;     reports that he quit smoking about 9 years ago. His smoking use included cigarettes. He has a 70.00 pack-year smoking history. He has never used smokeless tobacco. He reports that he does not currently use alcohol. He reports that he does not use drugs.  Allergies  Allergen Reactions   Contrast Media [Iodinated Contrast Media] Shortness Of Breath and Other (See Comments)    Difficulty breathing and altered mental status     Ivp Dye [Iodinated Contrast Media] Anaphylaxis, Shortness Of Breath and Other (See Comments)    Breathing problems, altered mental state    Adhesive [Tape] Rash and Other (See Comments)    Rash after 1 day of use   Latex Rash and Other (See Comments)    A severe rash  appears after the first 24 hours of being placed    Family History  Problem Relation Age of Onset   Heart disease Father        Before age 68-  CAD, BPG   Diabetes Father        Amputation   Cancer Father        PROSTATE   Hyperlipidemia  Father    Hypertension Father    Heart attack Father        Triple BPG   Varicose Veins Father    Colon cancer Brother    Diabetes Brother    Heart disease Brother 18       A-Fib. Before age 22   Hyperlipidemia Brother    Hypertension Brother    Cancer Sister        Breast   Hyperlipidemia Sister    Hypertension Sister    Hypertension Son    Arthritis Other        GRANDMOTHER   Hypertension Other        OTHER FAMILY MEMBERS    Prior to Admission medications   Medication Sig Start Date End Date Taking? Authorizing Provider  amLODipine (NORVASC) 5 MG tablet Take 5 mg by mouth daily. 01/30/21  Yes [provider]  apixaban (ELIQUIS) 5 MG TABS tablet Take 5 mg by mouth See admin instructions. Take 5 mg by mouth two times a day only on Sun/Mon/Wed/Fri non-dialysis days   Yes [provider]  atorvastatin (LIPITOR) 40 MG tablet Take 40 mg by mouth at bedtime.    Yes [provider]  doxycycline (VIBRA-TABS) 100 MG tablet Take 1 tablet (100 mg total) by mouth 2 (two) times daily. Patient taking differently: Take 100 mg by mouth See admin instructions. Bid x 30 days 07/26/21  Yes Dondra Prader R, NP  famotidine (PEPCID) 40 MG tablet Take 40 mg by mouth daily. 04/29/21  Yes [provider]  fentaNYL (DURAGESIC - DOSED MCG/HR) 50 MCG/HR Place 50 mcg onto the skin every 3 (three) days.   Yes [provider]  folic acid-vitamin b complex-vitamin c-selenium-zinc (DIALYVITE) 3 MG TABS tablet Take 1 tablet by mouth daily.   Yes [provider]  Glucosamine HCl (GLUCOSAMINE PO) Take 1 tablet by mouth 2 (two) times daily.   Yes [provider]  HUMALOG KWIKPEN 100 UNIT/ML KiwkPen Inject 13 Units  into the skin 3 (three) times daily. 11/14/17  Yes [provider]  insulin glargine (LANTUS) 100 UNIT/ML injection Inject 0.1 mLs (10 Units total) into the skin at bedtime. 06/30/21  Yes Antonieta Pert, MD  ketoconazole (NIZORAL) 2 % cream Apply 1 application topically 2 (two) times daily as needed for irritation.  03/07/18  Yes [provider]  lidocaine-prilocaine (EMLA) cream Apply 1 application topically See admin instructions. Prior to Dialysis days Tuesday,Thursday and saturday 06/06/21  Yes [provider]  linaclotide (LINZESS) 145 MCG CAPS capsule Take 145 mcg by mouth daily as needed (constipation).    Yes [provider]  loratadine (CLARITIN) 10 MG tablet Take 10 mg by mouth daily.   Yes [provider]  NEEDLE, REUSABLE, 22 G 22G X 1-1/2" MISC 1 Units by Does not apply route as directed. For B12 IM inj 02/15/18  Yes Elgergawy, Silver Huguenin, MD  nitroGLYCERIN (NITRODUR - DOSED IN MG/24 HR) 0.2 mg/hr patch Place 1 patch (0.2 mg total) onto the skin daily. 06/12/21  Yes Newt Minion, MD  omega-3 acid ethyl esters (LOVAZA) 1 g capsule Take 2 g by mouth 2 (two) times daily.   Yes [provider]  ondansetron (ZOFRAN) 4 MG tablet Take 4 mg by mouth every 6 (six) hours as needed for nausea or vomiting.   Yes [provider]  oxyCODONE-acetaminophen (PERCOCET) 7.5-325 MG tablet Take 1 tablet by mouth 3 (three) times daily as needed for moderate pain. 12/07/20  Yes [provider]  pantoprazole (PROTONIX) 40 MG tablet Take 40 mg by mouth daily.   Yes [provider]  Polyvinyl Alcohol-Povidone PF 1.4-0.6 % SOLN Place 1 drop into both eyes 2 (two) times daily as needed (dry eyes).    Yes [provider]  pregabalin (LYRICA) 75 MG capsule Take 1 capsule (75 mg total) by mouth daily for 14 days. Patient taking differently: Take 75 mg by mouth at bedtime. 06/30/21 10/03/21 Yes Antonieta Pert, MD  rOPINIRole (REQUIP) 2 MG tablet  Take 2 mg by mouth at bedtime.   Yes [provider]  Syringe/Needle, Disp, (SYRINGE 3CC/22GX1-1/2") 22G X 1-1/2" 3 ML MISC 1 Syringe by Does not apply route as directed. For b12 IM inj 02/15/18  Yes Elgergawy, Silver Huguenin, MD  tamsulosin (FLOMAX) 0.4 MG CAPS capsule Take 0.4 mg by mouth daily.   Yes [provider]    Physical Exam: Constitutional: Moderately built and nourished. Vitals:   09/02/21 1745 09/02/21 1830 09/02/21 2146 09/02/21 2228  BP: 104/90 (!) 110/40 (!) 162/57 (!) 152/57  Pulse: 91 (!) 117 (!) 115 (!) 117  Resp: (!) 23 20 20 15   Temp:   (!) 103.1 F (39.5 C) (!) 103 F (39.4 C)  TempSrc:   Oral Oral  SpO2: 95% 98% 95% 96%  Weight:    110.3 kg  Height:    6\' 2"  (1.88 m)   Eyes: Anicteric no pallor. ENMT: No discharge from the ears eyes nose and mouth. Neck: No mass felt.  No neck rigidity. Respiratory: No rhonchi or crepitations. Cardiovascular: S1-S2 heard. Abdomen: Soft nontender bowel sound present. Musculoskeletal: Right BKA.  Left lower extremity has a dressing done which I am not able to open at this time.  Toes does not look ischemic. Skin: Has rash on the buttocks. Neurologic: Alert awake oriented time place and person.  Moves all extremities. Psychiatric: Appears normal.  Normal affect.   Labs on Admission: I have personally reviewed following labs and imaging studies  CBC: Recent Labs  Lab 09/02/21 1700 09/02/21 1718  WBC 7.1  --   NEUTROABS 6.0  --   HGB 11.9* 12.2*  HCT 35.1* 36.0*  MCV 90.7  --   PLT 108*  --    Basic Metabolic Panel: Recent Labs  Lab 09/02/21 1700 09/02/21 1718  NA 139 137  K 3.8 3.8  CL 96* 94*  CO2 29  --   GLUCOSE 265* 262*  BUN 13 14  CREATININE 3.24* 3.10*  CALCIUM 8.2*  --    GFR: Estimated Creatinine Clearance: 30.5 mL/min (A) (by C-G formula based on SCr of 3.1 mg/dL (H)). Liver Function Tests: Recent Labs  Lab 09/02/21 1700  AST 20  ALT 11  ALKPHOS 129*  BILITOT 0.6  PROT 6.3*   ALBUMIN 3.3*   No results for input(s): LIPASE, AMYLASE in the last 168 hours. No results for input(s): AMMONIA in the last 168 hours. Coagulation Profile: No results for input(s): INR, PROTIME in the last 168 hours. Cardiac Enzymes: No results for input(s): CKTOTAL, CKMB, CKMBINDEX, TROPONINI in the last 168 hours. BNP (last 3 results) No results for input(s): PROBNP in the last 8760 hours. HbA1C: No results for input(s): HGBA1C in the last 72 hours. CBG: Recent Labs  Lab 09/02/21 2234  GLUCAP 152*   Lipid Profile: No results for input(s): CHOL, HDL, LDLCALC, TRIG, CHOLHDL, LDLDIRECT in the last 72 hours. Thyroid Function Tests: No results for input(s): TSH, T4TOTAL, FREET4, T3FREE, THYROIDAB  in the last 72 hours. Anemia Panel: No results for input(s): VITAMINB12, FOLATE, FERRITIN, TIBC, IRON, RETICCTPCT in the last 72 hours. Urine analysis:    Component Value Date/Time   COLORURINE YELLOW 06/13/2021 1220   APPEARANCEUR CLOUDY (A) 06/13/2021 1220   LABSPEC 1.013 06/13/2021 1220   PHURINE 7.0 06/13/2021 1220   GLUCOSEU >=500 (A) 06/13/2021 1220   HGBUR MODERATE (A) 06/13/2021 1220   BILIRUBINUR NEGATIVE 06/13/2021 1220   KETONESUR 5 (A) 06/13/2021 1220   PROTEINUR 100 (A) 06/13/2021 1220   UROBILINOGEN 0.2 01/14/2015 0346   NITRITE NEGATIVE 06/13/2021 1220   LEUKOCYTESUR LARGE (A) 06/13/2021 1220   Sepsis Labs: @LABRCNTIP (procalcitonin:4,lacticidven:4) ) Recent Results (from the past 240 hour(s))  Resp Panel by RT-PCR (Flu A&B, Covid) Nasopharyngeal Swab     Status: Abnormal   Collection Time: 09/02/21  5:10 PM   Specimen: Nasopharyngeal Swab; Nasopharyngeal(NP) swabs in vial transport medium  Result Value Ref Range Status   SARS Coronavirus 2 by RT PCR POSITIVE (A) NEGATIVE Final    Comment: (NOTE) SARS-CoV-2 target nucleic acids are DETECTED.  The SARS-CoV-2 RNA is generally detectable in upper respiratory specimens during the acute phase of infection.  Positive results are indicative of the presence of the identified virus, but do not rule out bacterial infection or co-infection with other pathogens not detected by the test. Clinical correlation with patient history and other diagnostic information is necessary to determine patient infection status. The expected result is Negative.  Fact Sheet for Patients: EntrepreneurPulse.com.au  Fact Sheet for Healthcare Providers: IncredibleEmployment.be  This test is not yet approved or cleared by the [place] FDA and  has been authorized for detection and/or diagnosis of SARS-CoV-2 by FDA under an Emergency Use Authorization (EUA).  This EUA will remain in effect (meaning this test can be used) for the duration of  the COVID-19 declaration under Section 564(b)(1) of the A ct, 21 U.S.C. section 360bbb-3(b)(1), unless the authorization is terminated or revoked sooner.     Influenza A by PCR NEGATIVE NEGATIVE Final   Influenza B by PCR NEGATIVE NEGATIVE Final    Comment: (NOTE) The Xpert Xpress SARS-CoV-2/FLU/RSV plus assay is intended as an aid in the diagnosis of influenza from Nasopharyngeal swab specimens and should not be used as a sole basis for treatment. Nasal washings and aspirates are unacceptable for Xpert Xpress SARS-CoV-2/FLU/RSV testing.  Fact Sheet for Patients: EntrepreneurPulse.com.au  Fact Sheet for Healthcare Providers: IncredibleEmployment.be  This test is not yet approved or cleared by the [place] FDA and has been authorized for detection and/or diagnosis of SARS-CoV-2 by FDA under an Emergency Use Authorization (EUA). This EUA will remain in effect (meaning this test can be used) for the duration of the COVID-19 declaration under Section 564(b)(1) of the Act, 21 U.S.C. section 360bbb-3(b)(1), unless the authorization is terminated or revoked.  Performed at [facility],  [place] [address] [place], [place] [address]      Radiological Exams on Admission: DG Chest Port 1 View  Result Date: 09/02/2021 CLINICAL DATA:  Altered mental status, fever EXAM: PORTABLE CHEST 1 VIEW COMPARISON:  June 13, 2021 FINDINGS: The heart size and mediastinal contours are within normal limits. Aortic atherosclerosis. Hazy bibasilar opacities may reflect atelectasis or infiltrate. No visible pleural effusion or pneumothorax. Partially visualized cervical fusion hardware. IMPRESSION: Hazy bibasilar opacities may reflect atelectasis or infiltrate. Electronically Signed   By: Dahlia Bailiff M.D.   On: 09/02/2021 17:27      Assessment/Plan Principal Problem:  Sepsis ([place]) Active Problems:   CAD (dz of distal, mid and proximal RCA with implantation of 3 overlapping drug-eluting stent,)   Insulin dependent type 2 diabetes mellitus (HCC)   S/P BKA (below knee amputation), right (HCC)   S/P carotid endarterectomy   OSA on CPAP   Chronic back pain   Chronic obstructive pulmonary disease, unspecified ([place])   ESRD on hemodialysis ([place])   COVID    Sepsis -at admission patient was hypotensive with a blood pressure systolic in the 40N tachycardic in the 130s lactic acid elevated febrile and meets sepsis physiology.  Patient's COVID test did come back positive.  However since patient has a wound on the left lower extremity and also has rash around the buttock area with recent infection in the penile implants we will get a CT pelvis and MRI of the left foot.  We will place on empiric antibiotics and follow cultures. COVID infection for which patient has been started on remdesivir.  Chest x-ray does show infiltrates.  Continue with empiric antibiotics for now.  Not on steroids since patient was not hypoxic.  Follow inflammatory markers.  Closely monitor respiratory status. Thrombocytopenia could be from sepsis.  Any further worsening will need further work-up. Diabetes mellitus type 2 uncontrolled  presently on glargine insulin with sliding scale coverage. Peripheral vascular disease status post revascularization procedure in October 2020 by [name].  On Eliquis and statins.  I am not able to completely examine the patient's left foot due to the dressing.  We will get wound team to see and at the time assess the pulses.  We are also getting MRI to reassess the left foot wound to make sure there is no signs of infection. CAD status post tenting denies any chest pain.  On Eliquis and statins. ESRD on hemodialysis on Tuesday Thursday and Saturday consult nephrology. Sleep apnea intolerant to CPAP. Chronic anemia likely from renal disease follow CBC.  Since patient has COVID infection and sepsis physiology will need close monitoring for any further worsening inpatient status.   DVT prophylaxis: Eliquis. Code Status: Full code. Family Communication: Discussed with patient. Disposition Plan: Home when stable. Consults called: Wound team. Admission status: Inpatient.   Rise Patience MD [facility] Pager [phone].  If 7PM-7AM, please contact night-coverage [email or web address] Password [email or web address]  09/02/2021, 11:40 PM

## 2021-09-02 NOTE — ED Triage Notes (Signed)
Pt was receiving routine dialysis today when staff reported he became altered, "not acting right," with chills. Medic reports fever 102.8. VS 182/94, 28R 89% on RA --> 96% once 2L applied. Glucose 225. Received 1G tylenol from EMS.

## 2021-09-02 NOTE — ED Notes (Addendum)
Per EMS: "fanny pack was left at dialysis center with wheelchair for wife to pick up, as pt requested".

## 2021-09-02 NOTE — ED Notes (Signed)
Dr Darl Householder at bedside, inserted ultrasound IV and drew first set of cultures

## 2021-09-03 ENCOUNTER — Inpatient Hospital Stay (HOSPITAL_COMMUNITY): Payer: 59

## 2021-09-03 DIAGNOSIS — N186 End stage renal disease: Secondary | ICD-10-CM | POA: Diagnosis not present

## 2021-09-03 DIAGNOSIS — Z992 Dependence on renal dialysis: Secondary | ICD-10-CM

## 2021-09-03 DIAGNOSIS — M86172 Other acute osteomyelitis, left ankle and foot: Secondary | ICD-10-CM

## 2021-09-03 DIAGNOSIS — U071 COVID-19: Secondary | ICD-10-CM | POA: Diagnosis not present

## 2021-09-03 DIAGNOSIS — A419 Sepsis, unspecified organism: Secondary | ICD-10-CM | POA: Diagnosis not present

## 2021-09-03 LAB — CBC WITH DIFFERENTIAL/PLATELET
Abs Immature Granulocytes: 0.02 10*3/uL (ref 0.00–0.07)
Basophils Absolute: 0 10*3/uL (ref 0.0–0.1)
Basophils Relative: 0 %
Eosinophils Absolute: 0 10*3/uL (ref 0.0–0.5)
Eosinophils Relative: 0 %
HCT: 28 % — ABNORMAL LOW (ref 39.0–52.0)
Hemoglobin: 9.2 g/dL — ABNORMAL LOW (ref 13.0–17.0)
Immature Granulocytes: 0 %
Lymphocytes Relative: 11 %
Lymphs Abs: 0.6 10*3/uL — ABNORMAL LOW (ref 0.7–4.0)
MCH: 30.3 pg (ref 26.0–34.0)
MCHC: 32.9 g/dL (ref 30.0–36.0)
MCV: 92.1 fL (ref 80.0–100.0)
Monocytes Absolute: 0.6 10*3/uL (ref 0.1–1.0)
Monocytes Relative: 11 %
Neutro Abs: 4.2 10*3/uL (ref 1.7–7.7)
Neutrophils Relative %: 78 %
Platelets: 90 10*3/uL — ABNORMAL LOW (ref 150–400)
RBC: 3.04 MIL/uL — ABNORMAL LOW (ref 4.22–5.81)
RDW: 15 % (ref 11.5–15.5)
WBC: 5.4 10*3/uL (ref 4.0–10.5)
nRBC: 0 % (ref 0.0–0.2)

## 2021-09-03 LAB — C-REACTIVE PROTEIN: CRP: 8.8 mg/dL — ABNORMAL HIGH (ref <1.0)

## 2021-09-03 LAB — COMPREHENSIVE METABOLIC PANEL
ALT: 11 U/L (ref 0–44)
AST: 15 U/L (ref 15–41)
Albumin: 2.6 g/dL — ABNORMAL LOW (ref 3.5–5.0)
Alkaline Phosphatase: 93 U/L (ref 38–126)
Anion gap: 9 (ref 5–15)
BUN: 17 mg/dL (ref 8–23)
CO2: 30 mmol/L (ref 22–32)
Calcium: 7.5 mg/dL — ABNORMAL LOW (ref 8.9–10.3)
Chloride: 98 mmol/L (ref 98–111)
Creatinine, Ser: 4.01 mg/dL — ABNORMAL HIGH (ref 0.61–1.24)
GFR, Estimated: 16 mL/min — ABNORMAL LOW (ref >60)
Glucose, Bld: 228 mg/dL — ABNORMAL HIGH (ref 70–99)
Potassium: 3.7 mmol/L (ref 3.5–5.1)
Sodium: 137 mmol/L (ref 135–145)
Total Bilirubin: 0.5 mg/dL (ref 0.3–1.2)
Total Protein: 5.1 g/dL — ABNORMAL LOW (ref 6.5–8.1)

## 2021-09-03 LAB — GLUCOSE, CAPILLARY
Glucose-Capillary: 124 mg/dL — ABNORMAL HIGH (ref 70–99)
Glucose-Capillary: 100 mg/dL — ABNORMAL HIGH (ref 70–99)
Glucose-Capillary: 157 mg/dL — ABNORMAL HIGH (ref 70–99)
Glucose-Capillary: 116 mg/dL — ABNORMAL HIGH (ref 70–99)

## 2021-09-03 LAB — PROCALCITONIN: Procalcitonin: 1.32 ng/mL

## 2021-09-03 LAB — LACTIC ACID, PLASMA
Lactic Acid, Venous: 2.3 mmol/L (ref 0.5–1.9)
Lactic Acid, Venous: 1.5 mmol/L (ref 0.5–1.9)

## 2021-09-03 LAB — SEDIMENTATION RATE: Sed Rate: 40 mm/hr — ABNORMAL HIGH (ref 0–16)

## 2021-09-03 LAB — D-DIMER, QUANTITATIVE: D-Dimer, Quant: 0.48 ug/mL-FEU (ref 0.00–0.50)

## 2021-09-03 MED ORDER — DARBEPOETIN ALFA 60 MCG/0.3ML IJ SOSY
60.0000 ug | PREFILLED_SYRINGE | INTRAMUSCULAR | Status: DC
Start: 2021-09-05 — End: 2021-09-08
  Administered 2021-09-05: 10:00:00 60 ug via INTRAVENOUS
  Filled 2021-09-03: qty 0.3

## 2021-09-03 MED ORDER — VANCOMYCIN HCL IN DEXTROSE 1-5 GM/200ML-% IV SOLN
1000.0000 mg | INTRAVENOUS | Status: DC
  Administered 2021-09-05 – 2021-09-07 (×2): 1000 mg via INTRAVENOUS
  Filled 2021-09-03 (×4): qty 200

## 2021-09-03 MED ORDER — SODIUM CHLORIDE 0.9 % IV SOLN: 2.0000 g | INTRAVENOUS | Status: DC

## 2021-09-03 MED ORDER — ONDANSETRON HCL 4 MG/2ML IJ SOLN: 4.0000 mg | Freq: Four times a day (QID) | INTRAMUSCULAR | Status: DC | PRN

## 2021-09-03 NOTE — Consult Note (Signed)
[facility] Renal Consultation Note  Requesting MD: Sharen Hones, MD Indication for Consultation:  ESRD  Chief complaint: fever and AMS  HPI:  [name] is a 65 y.o. male with a history of ESRD on hemodialysis, atrial fibrillation, HTN, and diabetes mellitus type 2 who presented to the hospital after he was found to be confused and febrile on dialysis.  He state that the only thing different was just that he felt very cold.  He was noted to have a fever of 103.  He was ultimately found to be COVID-positive here.  Nephrology is consulted for assistance with management of dialysis.  Earlier he received cefepime, azithromycin, and ceftriaxone and has now been started on remdesivir.  Last HD was on 12/24 and he left at 111.6 kg.  He has been leaving above his dry weight.  Note that he recently lost his brother who he states also had kidney failure.  They buried him on Friday; his family planned the funeral specifically on a non-dialysis day for the patient.  It's been hard. He has struggled with wounds on his left leg for a while.  He had a brace which caused one and that healed but he later developed others on the lower leg.   PMHx:   Past Medical History:  Diagnosis Date   Carotid artery occlusion 11/10/10   LEFT CAROTID ENDARTERECTOMY   Chronic kidney disease    Complication of anesthesia    BP WENT UP AT [facility] "   COPD (chronic obstructive pulmonary disease) ([place])    pt denies this dx as of 06/01/20 - no inhaler    Diabetes mellitus without complication ([place])    Diverticulitis    Diverticulosis of colon (without mention of hemorrhage)    DJD (degenerative joint disease)    knees/hands/feet/back/neck   Fatty liver    Full dentures    GERD (gastroesophageal reflux disease)    H/O hiatal hernia    History of blood transfusion    with a past surical procedure per patient 06/01/20   Hyperlipidemia    Hypertension    Neuromuscular disorder ([place])    peripheral neuropathy    Non-pressure chronic ulcer of other part of left foot limited to breakdown of skin ([place]) 11/12/2016   Osteomyelitis (HCC)    left 5th metatarsal   PAD (peripheral artery disease) ([place])    Distal aortogram June 2012. Atherectomy left popliteal artery July 2012.    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   Unstable angina ([place]) 09/16/2018   Wears glasses     Past Surgical History:  Procedure Laterality Date   ABDOMINAL AORTOGRAM W/LOWER EXTREMITY N/A 06/23/2021   Procedure: ABDOMINAL AORTOGRAM W/LOWER EXTREMITY;  Surgeon: Nigel Mormon, MD;  Location: [place] [facility];  Service: Cardiovascular;  Laterality: N/A;   AMPUTATION  11/05/2011   Procedure: AMPUTATION RAY;  Surgeon: Wylene Simmer, MD;  Location: [place];  Service: Orthopedics;  Laterality: Right;  Amputation of Right 4&5th Toes   AMPUTATION Left 11/26/2012   Procedure: AMPUTATION RAY;  Surgeon: Wylene Simmer, MD;  Location: [place];  Service: Orthopedics;  Laterality: Left;  fourth ray amputation   AMPUTATION Right 08/27/2014   Procedure: Transmetatarsal Amputation;  Surgeon: Newt Minion, MD;  Location: [place];  Service: Orthopedics;  Laterality: Right;   AMPUTATION Right  01/14/2015   Procedure: AMPUTATION BELOW KNEE;  Surgeon: Newt Minion, MD;  Location: [place];  Service: Orthopedics;  Laterality: Right;   AMPUTATION Left 10/21/2015   Procedure: Left Foot 5th Ray Amputation;  Surgeon: Newt Minion, MD;  Location: [place];  Service: Orthopedics;  Laterality: Left;   ANTERIOR FUSION CERVICAL SPINE  02/06/06   C4-5, C5-6, C6-7; SURGEON DR. MAX COHEN   AV FISTULA PLACEMENT Left 06/02/2020   Procedure: ARTERIOVENOUS (AV) FISTULA CREATION LEFT;  Surgeon: Waynetta Sandy, MD;  Location: [place];  Service: Vascular;  Laterality: Left;   BACK SURGERY     x 3   [place] Left 07/21/2020   Procedure:  LEFT UPPER ARM ATERIOVENOUS SUPERFISTULALIZATION;  Surgeon: Waynetta Sandy, MD;  Location: [place];  Service: Vascular;  Laterality: Left;   BELOW KNEE LEG AMPUTATION Right    CARDIAC CATHETERIZATION  10/31/04   2009   CAROTID ENDARTERECTOMY  11/10/10   CAROTID ENDARTERECTOMY Left 11/10/2010   Subtotal occlusion of left internal carotid artery with left hemispheric transient ischemic attacks.   CAROTID STENT     CARPAL TUNNEL RELEASE Right 10/21/2013   Procedure: RIGHT CARPAL TUNNEL RELEASE;  Surgeon: Wynonia Sours, MD;  Location: [place];  Service: Orthopedics;  Laterality: Right;   CHOLECYSTECTOMY     COLON SURGERY     COLONOSCOPY     COLOSTOMY REVERSAL  05/21/2018   ileostomy reversal   CYSTOSCOPY WITH STENT PLACEMENT Bilateral 01/13/2018   Procedure: CYSTOSCOPY WITH BILATERAL URETERAL CATHETER PLACEMENT;  Surgeon: Ardis Hughs, MD;  Location: [facility];  Service: Urology;  Laterality: Bilateral;   ESOPHAGEAL MANOMETRY Bilateral 07/19/2014   Procedure: ESOPHAGEAL MANOMETRY (EM);  Surgeon: Jerene Bears, MD;  [facility];  Service: Gastroenterology;  Laterality: Bilateral;   EYE SURGERY Bilateral 2020   cataract   FEMORAL ARTERY STENT     x6   FINGER SURGERY     FOOT SURGERY  04/25/2016    EXCISION BASE 5TH METATARSAL AND PARTIAL CUBOID LEFT FOOT   HERNIA REPAIR     LEFT INGUINAL AND UMBILICAL REPAIRS   HERNIA REPAIR     I & D EXTREMITY Left 04/25/2016   Procedure: EXCISION BASE 5TH METATARSAL AND PARTIAL CUBOID LEFT FOOT;  Surgeon: Newt Minion, MD;  Location: [place];  Service: Orthopedics;  Laterality: Left;   ILEOSTOMY  01/13/2018   Procedure: ILEOSTOMY;  Surgeon: Clovis Riley, MD;  Location: [facility];  Service: General;;   ILEOSTOMY CLOSURE N/A 05/21/2018   Procedure: ILEOSTOMY REVERSAL ERAS PATHWAY;  Surgeon: Clovis Riley, MD;  Location: [place];  Service: General;  Laterality: N/A;   IR RADIOLOGIST EVAL & MGMT  11/19/2017   IR RADIOLOGIST  EVAL & MGMT  12/03/2017   IR RADIOLOGIST EVAL & MGMT  12/18/2017   JOINT REPLACEMENT Right 2001   Total knee   LAMINECTOMY     X 3 LUMBAR AND X 2 CERVICAL SPINE OPERATIONS   LAPAROSCOPIC CHOLECYSTECTOMY W/ CHOLANGIOGRAPHY  11/09/04   SURGEON DR. [place] CATH AND CORONARY ANGIOGRAPHY N/A 09/16/2018   Procedure: LEFT HEART CATH AND CORONARY ANGIOGRAPHY;  Surgeon: Nigel Mormon, MD;  Location: [place] [facility];  Service: Cardiovascular;  Laterality: N/A;   LEFT HEART CATHETERIZATION WITH CORONARY ANGIOGRAM N/A 10/29/2014   Procedure: LEFT HEART CATHETERIZATION WITH CORONARY ANGIOGRAM;  Surgeon: Laverda Page, MD;  Location: [facility];  Service: Cardiovascular;  Laterality: N/A;   LIGATION OF COMPETING BRANCHES OF ARTERIOVENOUS FISTULA Left 07/21/2020   Procedure: LIGATION OF COMPETING BRANCHES OF LEFT UPPER ARM ARTERIOVENOUS FISTULA;  Surgeon: Waynetta Sandy, MD;  Location: [place];  Service: Vascular;  Laterality: Left;   LOWER EXTREMITY ANGIOGRAM N/A 03/19/2012   Procedure: LOWER EXTREMITY ANGIOGRAM;  Surgeon: Burnell Blanks, MD;  Location: [facility];  Service: Cardiovascular;  Laterality: N/A;   LOWER EXTREMITY ANGIOGRAPHY N/A 06/20/2021   Procedure: LOWER EXTREMITY ANGIOGRAPHY;  Surgeon: Nigel Mormon, MD;  Location: [place] [facility];  Service: Cardiovascular;  Laterality: N/A;   NECK SURGERY     PARTIAL COLECTOMY N/A 01/13/2018   Procedure: LAPAROSCOPIC ASSISTED   SIGMOID COLECTOMY ILEOSTOMY;  Surgeon: Clovis Riley, MD;  [facility];  Service: General;  Laterality: N/A;   PENILE PROSTHESIS IMPLANT  08/14/05   INFRAPUBIC INSERTION OF INFLATABLE PENILE PROSTHESIS; SURGEON DR. Amalia Hailey   PENILE PROSTHESIS IMPLANT     PERCUTANEOUS CORONARY STENT INTERVENTION (PCI-S) Right 10/29/2014   Procedure: PERCUTANEOUS CORONARY STENT INTERVENTION (PCI-S);  Surgeon: Laverda Page, MD;  Location: [facility];  Service: Cardiovascular;   Laterality: Right;   PERIPHERAL VASCULAR INTERVENTION Left 06/23/2021   Procedure: PERIPHERAL VASCULAR INTERVENTION;  Surgeon: Nigel Mormon, MD;  Location: [place] [facility];  Service: Cardiovascular;  Laterality: Left;   REMOVAL OF PENILE PROSTHESIS N/A 06/14/2021   Procedure: Removal of THREE piece inflatable penile prosthesis;  Surgeon: Lucas Mallow, MD;  Location: [place];  Service: Urology;  Laterality: N/A;   SHOULDER ARTHROSCOPY     SPINE SURGERY     TOE AMPUTATION Left    TONSILLECTOMY     TOTAL KNEE ARTHROPLASTY  07/2002   RIGHT KNEE ; SURGEON  DR. [name] ALSO HAD ARTHROSCOPIC RIGHT KNEE IN  10/2001   TOTAL KNEE ARTHROPLASTY     ULNAR NERVE TRANSPOSITION Right 10/21/2013   Procedure: RIGHT ELBOW  ULNAR NERVE DECOMPRESSION;  Surgeon: Wynonia Sours, MD;  Location: [place];  Service: Orthopedics;  Laterality: Right;    Family Hx:  Family History  Problem Relation Age of Onset   Heart disease Father        Before age 81-  CAD, BPG   Diabetes Father        Amputation   Cancer Father        PROSTATE   Hyperlipidemia Father    Hypertension Father    Heart attack Father        Triple BPG   Varicose Veins Father    Colon cancer Brother    Diabetes Brother    Heart disease Brother 34       A-Fib. Before age 34   Hyperlipidemia Brother    Hypertension Brother    Cancer Sister        Breast   Hyperlipidemia Sister    Hypertension Sister    Hypertension Son    Arthritis Other        GRANDMOTHER   Hypertension Other        OTHER FAMILY MEMBERS    Social History:  reports that he quit smoking about 9 years ago. His smoking use included cigarettes. He has a 70.00 pack-year smoking history. He has never used smokeless tobacco. He reports that he does not currently use alcohol. He reports that he does not use drugs.  Allergies:  Allergies  Allergen Reactions   Contrast Media [Iodinated Contrast Media] Shortness Of Breath and Other (  See Comments)     Difficulty breathing and altered mental status     Ivp Dye [Iodinated Contrast Media] Anaphylaxis, Shortness Of Breath and Other (See Comments)    Breathing problems, altered mental state    Adhesive [Tape] Rash and Other (See Comments)    Rash after 1 day of use   Latex Rash and Other (See Comments)    A severe rash appears after the first 24 hours of being placed    Medications: Prior to Admission medications   Medication Sig Start Date End Date Taking? Authorizing Provider  amLODipine (NORVASC) 5 MG tablet Take 5 mg by mouth daily. 01/30/21  Yes [provider]  apixaban (ELIQUIS) 5 MG TABS tablet Take 5 mg by mouth See admin instructions. Take 5 mg by mouth two times a day only on Sun/Mon/Wed/Fri non-dialysis days   Yes [provider]  atorvastatin (LIPITOR) 40 MG tablet Take 40 mg by mouth at bedtime.    Yes [provider]  doxycycline (VIBRA-TABS) 100 MG tablet Take 1 tablet (100 mg total) by mouth 2 (two) times daily. Patient taking differently: Take 100 mg by mouth See admin instructions. Bid x 30 days 07/26/21  Yes Dondra Prader R, NP  famotidine (PEPCID) 40 MG tablet Take 40 mg by mouth daily. 04/29/21  Yes [provider]  fentaNYL (DURAGESIC - DOSED MCG/HR) 50 MCG/HR Place 50 mcg onto the skin every 3 (three) days.   Yes [provider]  folic acid-vitamin b complex-vitamin c-selenium-zinc (DIALYVITE) 3 MG TABS tablet Take 1 tablet by mouth daily.   Yes [provider]  Glucosamine HCl (GLUCOSAMINE PO) Take 1 tablet by mouth 2 (two) times daily.   Yes [provider]  HUMALOG KWIKPEN 100 UNIT/ML KiwkPen Inject 13 Units into the skin 3 (three) times daily. 11/14/17  Yes [provider]  insulin glargine (LANTUS) 100 UNIT/ML injection Inject 0.1 mLs (10 Units total) into the skin at bedtime. 06/30/21  Yes Antonieta Pert, MD  ketoconazole (NIZORAL) 2 % cream Apply 1 application topically 2 (two) times daily as  needed for irritation.  03/07/18  Yes [provider]  lidocaine-prilocaine (EMLA) cream Apply 1 application topically See admin instructions. Prior to Dialysis days Tuesday,Thursday and saturday 06/06/21  Yes [provider]  linaclotide (LINZESS) 145 MCG CAPS capsule Take 145 mcg by mouth daily as needed (constipation).    Yes [provider]  loratadine (CLARITIN) 10 MG tablet Take 10 mg by mouth daily.   Yes [provider]  NEEDLE, REUSABLE, 22 G 22G X 1-1/2" MISC 1 Units by Does not apply route as directed. For B12 IM inj 02/15/18  Yes Elgergawy, Silver Huguenin, MD  nitroGLYCERIN (NITRODUR - DOSED IN MG/24 HR) 0.2 mg/hr patch Place 1 patch (0.2 mg total) onto the skin daily. 06/12/21  Yes Newt Minion, MD  omega-3 acid ethyl esters (LOVAZA) 1 g capsule Take 2 g by mouth 2 (two) times daily.   Yes [provider]  ondansetron (ZOFRAN) 4 MG tablet Take 4 mg by mouth every 6 (six) hours as needed for nausea or vomiting.   Yes [provider]  oxyCODONE-acetaminophen (PERCOCET) 7.5-325 MG tablet Take 1 tablet by mouth 3 (three) times daily as needed for moderate pain. 12/07/20  Yes [provider]  pantoprazole (PROTONIX) 40 MG tablet Take 40 mg by mouth daily.   Yes [provider]  Polyvinyl Alcohol-Povidone PF 1.4-0.6 % SOLN Place 1 drop into both eyes 2 (two)  times daily as needed (dry eyes).    Yes [provider]  pregabalin (LYRICA) 75 MG capsule Take 1 capsule (75 mg total) by mouth daily for 14 days. Patient taking differently: Take 75 mg by mouth at bedtime. 06/30/21 10/03/21 Yes Antonieta Pert, MD  rOPINIRole (REQUIP) 2 MG tablet Take 2 mg by mouth at bedtime.   Yes [provider]  Syringe/Needle, Disp, (SYRINGE 3CC/22GX1-1/2") 22G X 1-1/2" 3 ML MISC 1 Syringe by Does not apply route as directed. For b12 IM inj 02/15/18  Yes Elgergawy, Silver Huguenin, MD  tamsulosin (FLOMAX) 0.4 MG CAPS capsule Take 0.4 mg by mouth daily.    Yes [provider]    I have reviewed the patient's current and reported prior to admission medications.  Labs:  BMP Latest Ref Rng & Units 09/03/2021 09/02/2021 09/02/2021  Glucose 70 - 99 mg/dL 228(H) 262(H) 265(H)  BUN 8 - 23 mg/dL 17 14 13   Creatinine 0.61 - 1.24 mg/dL 4.01(H) 3.10(H) 3.24(H)  BUN/Creat Ratio 10 - 24 - - -  Sodium 135 - 145 mmol/L 137 137 139  Potassium 3.5 - 5.1 mmol/L 3.7 3.8 3.8  Chloride 98 - 111 mmol/L 98 94(L) 96(L)  CO2 22 - 32 mmol/L 30 - 29  Calcium 8.9 - 10.3 mg/dL 7.5(L) - 8.2(L)    ROS:  Pertinent items noted in HPI and remainder of comprehensive ROS otherwise negative.  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  Skin: dry skin left leg and wrapped as above; he had a healing visible wound above the bandages as well  Neuro: alert and oriented x 3 provides a hx and follows commands Psych normal mood and affect Access: LUE AVF with bruit and thrill   Outpatient HD orders:  TTS  [place]  4 hours and 15 minutes BF 450 and DF 800  2K/2 Ca bath EDW 108 kg AVF  Mircera 50 mcg every 2 weeks - last given 08/22/21 Venofer 50 mg IV weekly   Assessment/Plan:  # ESRD - no acute indication for dialysis today - HD per TTS schedule - next tx on 12/27  # Covid positive  - therapies per primary team - on remdesivir - would consider steroids   # Hypertension  -  improved control   # Atrial fibrillation - Per primary team   # Anemia CKD  - ESA due again 12/27 - order  for aranesp 60 mcg weekly placed to start 00/86  # Metabolic bone disease  - not on activated vit d outpt - phos in AM  - he is on a renal diet  Disposition - per primary team discretion; will need to notify his outpatient unit of covid positive status   Claudia Desanctis 09/03/2021, 3:03 PM

## 2021-09-03 NOTE — Consult Note (Signed)
[facility]  Total days of antibiotics 2               Reason for Consult: osteomyelitis of left foot   Referring Physician: zhang  Principal Problem:   Sepsis ([place]) Active Problems:   CAD (dz of distal, mid and proximal RCA with implantation of 3 overlapping drug-eluting stent,)   Insulin dependent type 2 diabetes mellitus ([place])   Acute osteomyelitis of left foot (HCC)   S/P BKA (below knee amputation), right (HCC)   S/P carotid endarterectomy   OSA on CPAP   Chronic back pain   Chronic obstructive pulmonary disease, unspecified ([place])   ESRD on hemodialysis ([place])   COVID    HPI: [name] is a 64 y.o. 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 Patient receives HD on T-th-Sat and found to have acute AMS, with fevers, associated tachycardia and hypoxia. He reports that he went to outdoor funeral on Friday which may have predisposed him to getting sick. He has hx of being vaccinated for covid included booster. He is not known to have covid symptoms nor exposure recently. While in the ED, his BP were in 90s with HR in 130s which improved with IVF. HIs labs were significant for LA of 4.1, wbc of 7 with 86% N, cxr bibasilar infiltrates ? Atelectesis, and MRI of left foo shows ulceration to lateral aspect of foot next to 4th TMT joint, concerning for OM involving cuboid and 4th MH. Hx of 5th ray amputation. The patient reports it has been looking better with dr duda. MRSA colonized. No worsening drainage or erythema.  Past Medical History:  Diagnosis Date   Carotid artery occlusion 11/10/10   LEFT CAROTID ENDARTERECTOMY   Chronic kidney disease    Complication of anesthesia    BP WENT UP AT [facility] "   COPD (chronic obstructive pulmonary disease) ([place])     pt denies this dx as of 06/01/20 - no inhaler    Diabetes mellitus without complication ([place])    Diverticulitis    Diverticulosis of colon (without mention of hemorrhage)    DJD (degenerative joint disease)    knees/hands/feet/back/neck   Fatty liver    Full dentures    GERD (gastroesophageal reflux disease)    H/O hiatal hernia    History of blood transfusion    with a past surical procedure per patient 06/01/20   Hyperlipidemia    Hypertension    Neuromuscular disorder ([place])    peripheral neuropathy   Non-pressure chronic ulcer of other part of left foot limited to breakdown of skin ([place]) 11/12/2016   Osteomyelitis (HCC)    left 5th metatarsal   PAD (peripheral artery disease) ([place])    Distal aortogram June 2012. Atherectomy left popliteal artery July 2012.    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   Unstable angina ([place]) 09/16/2018   Wears glasses     Allergies:  Allergies  Allergen Reactions   Contrast Media [Iodinated Contrast Media] Shortness Of Breath and Other (See Comments)    Difficulty breathing and altered mental status  Ivp Dye [Iodinated Contrast Media] Anaphylaxis, Shortness Of Breath and Other (See Comments)    Breathing problems, altered mental state    Adhesive [Tape] Rash and Other (See Comments)    Rash after 1 day of use   Latex Rash and Other (See Comments)    A severe rash appears after the first 24 hours of being placed    MEDICATIONS:  apixaban  5 mg Oral BID   atorvastatin  40 mg Oral QHS   famotidine  40 mg Oral Daily   [START ON 09/04/2021] fentaNYL  1 patch Transdermal Q72H   insulin aspart  0-6 Units Subcutaneous TID WC   insulin glargine-yfgn  10 Units Subcutaneous QHS   loratadine  10 mg Oral Daily   omega-3 acid ethyl esters  2 g Oral BID   pantoprazole  40 mg Oral Daily   pregabalin  75 mg Oral QHS    rOPINIRole  2 mg Oral QHS   tamsulosin  0.4 mg Oral Daily   traZODone  50 mg Oral Once    Social History   Tobacco Use   Smoking status: Former    Packs/day: 2.00    Years: 35.00    Pack years: 70.00    Types: Cigarettes    Quit date: 10/28/2011    Years since quitting: 9.8   Smokeless tobacco: Never  Vaping Use   Vaping Use: Some days   Substances: Nicotine  Substance Use Topics   Alcohol use: Not Currently    Comment: "not in a long time"   Drug use: Never    Family History  Problem Relation Age of Onset   Heart disease Father        Before age 64-  CAD, BPG   Diabetes Father        Amputation   Cancer Father        PROSTATE   Hyperlipidemia Father    Hypertension Father    Heart attack Father        Triple BPG   Varicose Veins Father    Colon cancer Brother    Diabetes Brother    Heart disease Brother 39       A-Fib. Before age 46   Hyperlipidemia Brother    Hypertension Brother    Cancer Sister        Breast   Hyperlipidemia Sister    Hypertension Sister    Hypertension Son    Arthritis Other        GRANDMOTHER   Hypertension Other        OTHER FAMILY MEMBERS     Review of Systems  Constitutional: Negative for fever, chills, positive for diaphoresis, activity change, appetite change, fatigue and unexpected weight change.  HENT: Negative for congestion, sore throat, rhinorrhea, sneezing, trouble swallowing and sinus pressure.  Eyes: Negative for photophobia and visual disturbance.  Respiratory: Negative for cough, chest tightness, shortness of breath, wheezing and stridor.  Cardiovascular: Negative for chest pain, palpitations and leg swelling.  Gastrointestinal: Negative for nausea, vomiting, abdominal pain, diarrhea, constipation, blood in stool, abdominal distention and anal bleeding.  Genitourinary: Negative for dysuria, hematuria, flank pain and difficulty urinating.  Musculoskeletal: Negative for myalgias, back pain, joint swelling, arthralgias  and gait problem.  Skin: positive for chronic wound Negative for color change, pallor, rash and wound.  Neurological: Negative for dizziness, tremors, weakness and light-headedness.  Hematological: Negative for adenopathy. Does not bruise/bleed easily.  Psychiatric/Behavioral: Negative for behavioral problems, confusion, sleep disturbance, dysphoric mood, decreased  concentration and agitation.     OBJECTIVE: Temp:  [99.4 F (37.4 C)-103.1 F (39.5 C)] 99.4 F (37.4 C) (12/25 1234) Pulse Rate:  [60-130] 95 (12/25 1234) Resp:  [15-23] 16 (12/25 1234) BP: (91-178)/(39-95) 136/95 (12/25 1234) SpO2:  [92 %-98 %] 98 % (12/25 1234) Weight:  [108.9 kg-110.3 kg] 110.3 kg (12/24 2228) Physical Exam  Constitutional: He is oriented to person, place, and time. He appears well-developed and well-nourished. No distress.  HENT:  Mouth/Throat: Oropharynx is clear and moist. No oropharyngeal exudate.  Cardiovascular: Normal rate, regular rhythm and normal heart sounds. Exam reveals no gallop and no friction rub.  No murmur heard.  Pulmonary/Chest: Effort normal and breath sounds normal. No respiratory distress. He has no wheezes.  Abdominal: Soft. Bowel sounds are normal. He exhibits no distension. There is no tenderness.  URK:YHCW foot dry,scaly skins, lateral wound, mild drainage, not foul smelling, mild surrounding erythema, but mostly macerated Neurological: He is alert and oriented to person, place, and time.  Skin: Skin is warm and dry. No rash noted. No erythema.  Psychiatric: He has a normal mood and affect. His behavior is normal.   LABS: Results for orders placed or performed during the hospital encounter of 09/02/21 (from the past 48 hour(s))  CBC with Differential/Platelet     Status: Abnormal   Collection Time: 09/02/21  5:00 PM  Result Value Ref Range   WBC 7.1 4.0 - 10.5 K/uL   RBC 3.87 (L) 4.22 - 5.81 MIL/uL   Hemoglobin 11.9 (L) 13.0 - 17.0 g/dL   HCT 35.1 (L) 39.0 - 52.0 %    MCV 90.7 80.0 - 100.0 fL   MCH 30.7 26.0 - 34.0 pg   MCHC 33.9 30.0 - 36.0 g/dL   RDW 14.7 11.5 - 15.5 %   Platelets 108 (L) 150 - 400 K/uL    Comment: Immature Platelet Fraction may be clinically indicated, consider ordering this additional test [id number] REPEATED TO VERIFY PLATELET COUNT CONFIRMED BY SMEAR    nRBC 0.0 0.0 - 0.2 %   Neutrophils Relative % 86 %   Neutro Abs 6.0 1.7 - 7.7 K/uL   Lymphocytes Relative 4 %   Lymphs Abs 0.3 (L) 0.7 - 4.0 K/uL   Monocytes Relative 9 %   Monocytes Absolute 0.7 0.1 - 1.0 K/uL   Eosinophils Relative 1 %   Eosinophils Absolute 0.1 0.0 - 0.5 K/uL   Basophils Relative 0 %   Basophils Absolute 0.0 0.0 - 0.1 K/uL   Immature Granulocytes 0 %   Abs Immature Granulocytes 0.03 0.00 - 0.07 K/uL    Comment: Performed at [facility], [address] [place], [place] [address]  Comprehensive metabolic panel     Status: Abnormal   Collection Time: 09/02/21  5:00 PM  Result Value Ref Range   Sodium 139 135 - 145 mmol/L   Potassium 3.8 3.5 - 5.1 mmol/L   Chloride 96 (L) 98 - 111 mmol/L   CO2 29 22 - 32 mmol/L   Glucose, Bld 265 (H) 70 - 99 mg/dL    Comment: Glucose reference range applies only to samples taken after fasting for at least 8 hours.   BUN 13 8 - 23 mg/dL   Creatinine, Ser 3.24 (H) 0.61 - 1.24 mg/dL   Calcium 8.2 (L) 8.9 - 10.3 mg/dL   Total Protein 6.3 (L) 6.5 - 8.1 g/dL   Albumin 3.3 (L) 3.5 - 5.0 g/dL   AST 20 15 - 41 U/L  ALT 11 0 - 44 U/L   Alkaline Phosphatase 129 (H) 38 - 126 U/L   Total Bilirubin 0.6 0.3 - 1.2 mg/dL   GFR, Estimated 20 (L) >60 mL/min    Comment: (NOTE) Calculated using the CKD-EPI Creatinine Equation (2021)    Anion gap 14 5 - 15    Comment: Performed at [facility] [address] [place], [place] [address]  Troponin I (High Sensitivity)     Status: Abnormal   Collection Time: 09/02/21  5:00 PM  Result Value Ref Range   Troponin I (High Sensitivity) 26 (H) <18 ng/L    Comment:  (NOTE) Elevated high sensitivity troponin I (hsTnI) values and significant  changes across serial measurements may suggest ACS but many other  chronic and acute conditions are known to elevate hsTnI results.  Refer to the "Links" section for chest pain algorithms and additional  guidance. Performed at [facility], [place] [address] [place], [place] [address]   Lactic acid, plasma     Status: Abnormal   Collection Time: 09/02/21  5:00 PM  Result Value Ref Range   Lactic Acid, Venous 4.1 (HH) 0.5 - 1.9 mmol/L    Comment: CRITICAL RESULT CALLED TO, READ BACK BY AND VERIFIED WITH: A BRITT RN BY SSTEPHENS 1919 [id number] Performed at [facility], [place] [address] [place], [place] [address]   Resp Panel by RT-PCR (Flu A&B, Covid) Nasopharyngeal Swab     Status: Abnormal   Collection Time: 09/02/21  5:10 PM   Specimen: Nasopharyngeal Swab; Nasopharyngeal(NP) swabs in vial transport medium  Result Value Ref Range   SARS Coronavirus 2 by RT PCR POSITIVE (A) NEGATIVE    Comment: (NOTE) SARS-CoV-2 target nucleic acids are DETECTED.  The SARS-CoV-2 RNA is generally detectable in upper respiratory specimens during the acute phase of infection. Positive results are indicative of the presence of the identified virus, but do not rule out bacterial infection or co-infection with other pathogens not detected by the test. Clinical correlation with patient history and other diagnostic information is necessary to determine patient infection status. The expected result is Negative.  Fact Sheet for Patients: EntrepreneurPulse.com.au  Fact Sheet for Healthcare Providers: IncredibleEmployment.be  This test is not yet approved or cleared by the [place] FDA and  has been authorized for detection and/or diagnosis of SARS-CoV-2 by FDA under an Emergency Use Authorization (EUA).  This EUA will remain in effect (meaning this test can be used) for the  duration of  the COVID-19 declaration under Section 564(b)(1) of the A ct, 21 U.S.C. section 360bbb-3(b)(1), unless the authorization is terminated or revoked sooner.     Influenza A by PCR NEGATIVE NEGATIVE   Influenza B by PCR NEGATIVE NEGATIVE    Comment: (NOTE) The Xpert Xpress SARS-CoV-2/FLU/RSV plus assay is intended as an aid in the diagnosis of influenza from Nasopharyngeal swab specimens and should not be used as a sole basis for treatment. Nasal washings and aspirates are unacceptable for Xpert Xpress SARS-CoV-2/FLU/RSV testing.  Fact Sheet for Patients: EntrepreneurPulse.com.au  Fact Sheet for Healthcare Providers: IncredibleEmployment.be  This test is not yet approved or cleared by the [place] FDA and has been authorized for detection and/or diagnosis of SARS-CoV-2 by FDA under an Emergency Use Authorization (EUA). This EUA will remain in effect (meaning this test can be used) for the duration of the COVID-19 declaration under Section 564(b)(1) of the Act, 21 U.S.C. section 360bbb-3(b)(1), unless the authorization is terminated or revoked.  Performed at [facility], [place] [address], [place], [place] [address]   I-stat chem 8, ED (not at [facility] or [facility])     Status: Abnormal   Collection Time: 09/02/21  5:18 PM  Result Value Ref Range   Sodium 137 135 - 145 mmol/L   Potassium 3.8 3.5 - 5.1 mmol/L   Chloride 94 (L) 98 - 111 mmol/L   BUN 14 8 - 23 mg/dL   Creatinine, Ser 3.10 (H) 0.61 - 1.24 mg/dL   Glucose, Bld 262 (H) 70 - 99 mg/dL    Comment: Glucose reference range applies only to samples taken after fasting for at least 8 hours.   Calcium, Ion 0.95 (L) 1.15 - 1.40 mmol/L   TCO2 29 22 - 32 mmol/L   Hemoglobin 12.2 (L) 13.0 - 17.0 g/dL   HCT 36.0 (L) 39.0 - 52.0 %  Glucose, capillary     Status: Abnormal   Collection Time: 09/02/21 10:34 PM  Result Value Ref Range   Glucose-Capillary 152 (H) 70 - 99 mg/dL     Comment: Glucose reference range applies only to samples taken after fasting for at least 8 hours.  Lactic acid, plasma     Status: Abnormal   Collection Time: 09/03/21  1:22 AM  Result Value Ref Range   Lactic Acid, Venous 2.3 (HH) 0.5 - 1.9 mmol/L    Comment: CRITICAL VALUE NOTED.  VALUE IS CONSISTENT WITH PREVIOUSLY REPORTED AND CALLED VALUE. Performed at [facility], [place] [address] [place], [place] [address]   CBC WITH DIFFERENTIAL     Status: Abnormal   Collection Time: 09/03/21  1:22 AM  Result Value Ref Range   WBC 5.4 4.0 - 10.5 K/uL   RBC 3.04 (L) 4.22 - 5.81 MIL/uL   Hemoglobin 9.2 (L) 13.0 - 17.0 g/dL   HCT 28.0 (L) 39.0 - 52.0 %   MCV 92.1 80.0 - 100.0 fL   MCH 30.3 26.0 - 34.0 pg   MCHC 32.9 30.0 - 36.0 g/dL   RDW 15.0 11.5 - 15.5 %   Platelets 90 (L) 150 - 400 K/uL    Comment: Immature Platelet Fraction may be clinically indicated, consider ordering this additional test [id number] CONSISTENT WITH PREVIOUS RESULT REPEATED TO VERIFY    nRBC 0.0 0.0 - 0.2 %   Neutrophils Relative % 78 %   Neutro Abs 4.2 1.7 - 7.7 K/uL   Lymphocytes Relative 11 %   Lymphs Abs 0.6 (L) 0.7 - 4.0 K/uL   Monocytes Relative 11 %   Monocytes Absolute 0.6 0.1 - 1.0 K/uL   Eosinophils Relative 0 %   Eosinophils Absolute 0.0 0.0 - 0.5 K/uL   Basophils Relative 0 %   Basophils Absolute 0.0 0.0 - 0.1 K/uL   Immature Granulocytes 0 %   Abs Immature Granulocytes 0.02 0.00 - 0.07 K/uL    Comment: Performed at [facility], [place] [address] [place], [place] [address]  Comprehensive metabolic panel     Status: Abnormal   Collection Time: 09/03/21  1:22 AM  Result Value Ref Range   Sodium 137 135 - 145 mmol/L   Potassium 3.7 3.5 - 5.1 mmol/L   Chloride 98 98 - 111 mmol/L   CO2 30 22 - 32 mmol/L   Glucose, Bld 228 (H) 70 - 99 mg/dL    Comment: Glucose reference range applies only to samples taken after fasting for at least 8 hours.   BUN 17 8 - 23 mg/dL  Creatinine, Ser 4.01 (H)  0.61 - 1.24 mg/dL   Calcium 7.5 (L) 8.9 - 10.3 mg/dL   Total Protein 5.1 (L) 6.5 - 8.1 g/dL   Albumin 2.6 (L) 3.5 - 5.0 g/dL   AST 15 15 - 41 U/L   ALT 11 0 - 44 U/L   Alkaline Phosphatase 93 38 - 126 U/L   Total Bilirubin 0.5 0.3 - 1.2 mg/dL   GFR, Estimated 16 (L) >60 mL/min    Comment: (NOTE) Calculated using the CKD-EPI Creatinine Equation (2021)    Anion gap 9 5 - 15    Comment: Performed at [facility], [place] [address], [place], [place] [address]  Procalcitonin     Status: None   Collection Time: 09/03/21  1:22 AM  Result Value Ref Range   Procalcitonin 1.32 ng/mL    Comment:        Interpretation: PCT > 0.5 ng/mL and <= 2 ng/mL: Systemic infection (sepsis) is possible, but other conditions are known to elevate PCT as well. (NOTE)       Sepsis PCT Algorithm           Lower Respiratory Tract                                      Infection PCT Algorithm    ----------------------------     ----------------------------         PCT < 0.25 ng/mL                PCT < 0.10 ng/mL          Strongly encourage             Strongly discourage   discontinuation of antibiotics    initiation of antibiotics    ----------------------------     -----------------------------       PCT 0.25 - 0.50 ng/mL            PCT 0.10 - 0.25 ng/mL               OR       >80% decrease in PCT            Discourage initiation of                                            antibiotics      Encourage discontinuation           of antibiotics    ----------------------------     -----------------------------         PCT >= 0.50 ng/mL              PCT 0.26 - 0.50 ng/mL                AND       <80% decrease in PCT             Encourage initiation of                                             antibiotics       Encourage continuation           of antibiotics    ----------------------------     -----------------------------  PCT >= 0.50 ng/mL                  PCT > 0.50 ng/mL               AND          increase in PCT                  Strongly encourage                                      initiation of antibiotics    Strongly encourage escalation           of antibiotics                                     -----------------------------                                           PCT <= 0.25 ng/mL                                                 OR                                        > 80% decrease in PCT                                      Discontinue / Do not initiate                                             antibiotics  Performed at [facility], [address], [place], [place] [address]   Lactic acid, plasma     Status: None   Collection Time: 09/03/21  3:35 AM  Result Value Ref Range   Lactic Acid, Venous 1.5 0.5 - 1.9 mmol/L    Comment: Performed at [facility] [address], [place], [place] [address]  D-dimer, quantitative     Status: None   Collection Time: 09/03/21  6:12 AM  Result Value Ref Range   D-Dimer, Quant 0.48 0.00 - 0.50 ug/mL-FEU    Comment: (NOTE) At the manufacturer cut-off value of 0.5 g/mL FEU, this assay has a negative predictive value of 95-100%.This assay is intended for use in conjunction with a clinical pretest probability (PTP) assessment model to exclude pulmonary embolism (PE) and deep venous thrombosis (DVT) in outpatients suspected of PE or DVT. Results should be correlated with clinical presentation. Performed at [facility], [place] [address], [place], [place] [address]   Glucose, capillary     Status: Abnormal   Collection Time: 09/03/21  6:46 AM  Result Value Ref Range   Glucose-Capillary 124 (H) 70 - 99 mg/dL    Comment: Glucose  reference range applies only to samples taken after fasting for at least 8 hours.   Comment 1 Document in Chart   Glucose, capillary     Status: Abnormal   Collection Time: 09/03/21 12:54 PM  Result Value Ref Range   Glucose-Capillary 100 (H) 70 - 99 mg/dL    Comment: Glucose  reference range applies only to samples taken after fasting for at least 8 hours.    MICRO: 12/24 blood cx PENDING IMAGING: CT PELVIS WO CONTRAST  Result Date: 09/03/2021 CLINICAL DATA:  65 year old male with history of perineum infection in October. Query anorectal abscess. EXAM: CT PELVIS WITHOUT CONTRAST TECHNIQUE: Multidetector CT imaging of the pelvis was performed following the standard protocol without intravenous contrast. COMPARISON:  CT Abdomen and Pelvis 06/13/2021. FINDINGS: Urinary Tract: Visible distal ureters are within normal limits. Bladder is within normal limits today. Bowel: The rectum is less distended, which might explain the appearance of mild circumferential rectal wall thickening (series 3, image 47). No convincing perirectal inflammation. No discrete perianal abscess or inflammation. Sigmoid colon anastomosis with no adverse features. Upstream diverticulosis of the descending colon. Small bowel anastomosis in the right abdomen on series 3, image 7 with no adverse features. No dilated small bowel is visible. Negative visible right colon and appendix (coronal image 44). Postoperative changes to the ventral abdominal wall with small fat containing periumbilical hernias (series 3, image 4). No free air or free fluid. Vascular/Lymphatic: Extensive Aortoiliac calcified atherosclerosis. No lymphadenopathy. Reproductive: Previously seen penile implant has been explanted. Perineum inflammation seen previously appears resolved. Mild residual soft tissue scarring. Scrotum remarkable for evidence of bilateral hydrocele and some dystrophic bilateral scrotal calcifications. No soft tissue gas. Other:  No pelvic free fluid. Musculoskeletal: Multilevel chronic lower lumbar decompression and fusion. Solid appearing L4-L5 and L5-S1 arthrodesis. Osteopenia. Sacrum, SI joints, pelvis and proximal femurs appear intact. IMPRESSION: 1. No perineal or perianal inflammation or abscess identified.  Circumferential rectal wall thickening which might be artifact due to under distension, but Acute Proctitis cannot be excluded. 2. Explanted penile implant since October. Bilateral scrotal hydrocele and dystrophic calcifications. 3. No other acute or inflammatory process identified. Prior small and large bowel surgery. Diverticulosis of the descending colon. Aortic Atherosclerosis (ICD10-I70.0). Electronically Signed   By: Genevie Ann M.D.   On: 09/03/2021 05:29   MR FOOT LEFT WO CONTRAST  Result Date: 09/03/2021 CLINICAL DATA:  Foot swelling, nondiabetic, osteomyelitis suspected EXAM: MRI OF THE LEFT FOOT WITHOUT CONTRAST TECHNIQUE: Multiplanar, multisequence MR imaging of the left forefoot was performed. No intravenous contrast was administered. COMPARISON:  06/16/2021, 06/13/2021 FINDINGS: Technical Note: Despite efforts by the technologist and patient, motion artifact is present on today's exam and could not be eliminated. This reduces exam sensitivity and specificity. Bones/Joint/Cartilage Patient is status post fifth ray resection as well as fourth toe amputation at the level of the fourth MTP joint. Findings of acute osteomyelitis involving the plantar-lateral aspect of the cuboid adjacent to soft tissue ulceration. There is also acute osteomyelitis with small erosion involving the fourth metatarsal base. Additional findings of likely early acute osteomyelitis involving the peripheral aspect of the lateral cuneiform and adjacent third metatarsal base. Evaluation of the distal phalanxes of the toes is limited secondary to artifact at the edge of the field of view and motion degradation. No acute fracture or dislocation.  Similar degenerative changes. Ligaments Intact Lisfranc ligament. Muscles and Tendons Amputation changes of the musculotendinous structures at the lateral forefoot. Advanced chronic denervation changes of the remaining musculature.  No large tenosynovial fluid collection. Soft tissues  Wound/ulceration at the lateral aspect of the foot adjacent to the fourth TMT joint. Multiple foci of susceptibility within the soft tissues along the lateral margin of the foot compatible with soft tissue air. No fluid collections. IMPRESSION: 1. Wound/ulceration at the lateral aspect of the foot adjacent to the fourth TMT joint. 2. Findings of acute osteomyelitis involving the cuboid and adjacent fourth metatarsal base. 3. Additional findings suggest early acute osteomyelitis involving the peripheral aspect of the lateral cuneiform and third metatarsal base. 4. Multiple foci of susceptibility within the soft tissues along the lateral margin of the foot compatible with soft tissue air, which may be secondary to open wound or infection with gas-forming organism. Electronically Signed   By: Davina Poke D.O.   On: 09/03/2021 10:25   DG Chest Port 1 View  Result Date: 09/02/2021 CLINICAL DATA:  Altered mental status, fever EXAM: PORTABLE CHEST 1 VIEW COMPARISON:  June 13, 2021 FINDINGS: The heart size and mediastinal contours are within normal limits. Aortic atherosclerosis. Hazy bibasilar opacities may reflect atelectasis or infiltrate. No visible pleural effusion or pneumothorax. Partially visualized cervical fusion hardware. IMPRESSION: Hazy bibasilar opacities may reflect atelectasis or infiltrate. Electronically Signed   By: Dahlia Bailiff M.D.   On: 09/02/2021 17:27     Assessment/Plan:  [id number] with hx of chronic osteomyelitis, left foot ulcer, ESRD on HD admitted for sepsis found to be covid PCR positive (no recent history) and acute on chronic osteomyelitis of left foot.  - recommend to continue with vancomycin and ceftaz so that it can be given with hd - continue to follow blood cx as possible source of sepsis  - will check sed rate and crp - discuss with ortho if any further debridement is needed? Continue with wound care. May benefit from wound care consult, if silver alginate would be  useful to draw out drainage - can consider to treat for 4 wk with IV vanco and ceftaz post HD for osteomyelitis and no longer needs doxy for the time being  Covid illness= possibly catching early? Not necessarily having pulmonary symptoms. Finish out 3 days of remdesivir.  Elzie [facility] [phone]

## 2021-09-03 NOTE — Progress Notes (Signed)
PROGRESS NOTE    [name]  CZY:[id number] DOB: 28-Aug-1956 DOA: 09/02/2021 PCP: [name], PA-C   Chief complaint: Confusion and fever Brief Narrative:  [name] is a 65 y.o. male with history of ESRD on hemodialysis, atrial fibrillation, diabetes mellitus type 2 while in the dialysis was found to be confused and febrile and having rigors was brought to the ER.  He had a temperature of 103, heart rate of 130, he was COVID-positive.  His lactic acid was 4.1 at the time admission.  He also has chronic left foot wound which was followed by orthopedics. He was not hypoxemic, he is placed on cefepime and vancomycin for sepsis  Assessment & Plan:   Principal Problem:   Sepsis ([place]) Active Problems:   CAD (dz of distal, mid and proximal RCA with implantation of 3 overlapping drug-eluting stent,)   Insulin dependent type 2 diabetes mellitus (HCC)   S/P BKA (below knee amputation), right (HCC)   S/P carotid endarterectomy   OSA on CPAP   Chronic back pain   Chronic obstructive pulmonary disease, unspecified ([place])   ESRD on hemodialysis ([place])   COVID  Sepsis. Thrombocytopenia secondary to sepsis. Left foot chronic wound with osteomyelitis. Reviewed MRI results, there is evidence of osteomyelitis in the left foot wound.  Will obtain orthopedics as well as ID consulted Continue antibiotics with cefepime and vancomycin. Blood culture is pending.     COVID-19 pneumonia. This could be partially responsible for the fever. I reviewed the patient chest x-ray, there is evidence of pneumonia.  We will continue remdesivir. Patient does not have hypoxemia, will hold off steroids.  Uncontrolled type 2 diabetes with hyperglycemia Continue current regimen.  Peripheral vascular disease status post revascularization. Coronary artery disease status post stenting. Conditions are stable.  End-stage renal disease. Anemia of chronic kidney disease. Continue dialysis per  nephrology  Obstructive sleep apnea. Continue CPAP  Paroxysmal atrial fibrillation. Continue Eliquis.  DVT prophylaxis: Eliquis Code Status: Full Family Communication:  Disposition Plan:    Status is: Inpatient  Remains inpatient appropriate because: Severity of disease, IV antibiotics.    I/O last 3 completed shifts: In: 2304.6 [P.O.:486; IV Piggyback:1818.6] Out: 375 [Urine:375] No intake/output data recorded.     Consultants:  ID, nephrology, orthopedics.  Procedures: None  Antimicrobials: Cefepime and vancomycin.  Subjective: Patient feels well today, no additional fever today. Denies any short of breath or cough. No abdominal pain or nausea vomiting. No chest pain or palpitation.  Objective: Vitals:   09/03/21 0337 09/03/21 0849 09/03/21 0934 09/03/21 1234  BP: (!) 135/58 (!) 178/87 (!) 141/62 (!) 136/95  Pulse: 92 60  95  Resp: 19 20 16 16   Temp: 100 F (37.8 C) 100.1 F (37.8 C)  99.4 F (37.4 C)  TempSrc: Oral Oral  Oral  SpO2: 92% 96%  98%  Weight:      Height:        Intake/Output Summary (Last 24 hours) at 09/03/2021 1250 Last data filed at 09/03/2021 0655 Gross per 24 hour  Intake 2304.55 ml  Output 375 ml  Net 1929.55 ml   Filed Weights   09/02/21 1702 09/02/21 2228  Weight: 108.9 kg 110.3 kg    Examination:  General exam: Appears calm and comfortable  Respiratory system: Clear to auscultation. Respiratory effort normal. Cardiovascular system: S1 & S2 heard, RRR. No JVD, murmurs, rubs, gallops or clicks. No pedal edema. Gastrointestinal system: Abdomen is nondistended, soft and nontender. No organomegaly or masses felt. Normal  bowel sounds heard. Central nervous system: Alert and oriented. No focal neurological deficits. Extremities: Left foot wound as above Skin: No rashes, lesions or ulcers Psychiatry: Mood & affect appropriate.     Data Reviewed: I have personally reviewed following labs and imaging  studies  CBC: Recent Labs  Lab 09/02/21 1700 09/02/21 1718 09/03/21 0122  WBC 7.1  --  5.4  NEUTROABS 6.0  --  4.2  HGB 11.9* 12.2* 9.2*  HCT 35.1* 36.0* 28.0*  MCV 90.7  --  92.1  PLT 108*  --  90*   Basic Metabolic Panel: Recent Labs  Lab 09/02/21 1700 09/02/21 1718 09/03/21 0122  NA 139 137 137  K 3.8 3.8 3.7  CL 96* 94* 98  CO2 29  --  30  GLUCOSE 265* 262* 228*  BUN 13 14 17   CREATININE 3.24* 3.10* 4.01*  CALCIUM 8.2*  --  7.5*   GFR: Estimated Creatinine Clearance: 23.6 mL/min (A) (by C-G formula based on SCr of 4.01 mg/dL (H)). Liver Function Tests: Recent Labs  Lab 09/02/21 1700 09/03/21 0122  AST 20 15  ALT 11 11  ALKPHOS 129* 93  BILITOT 0.6 0.5  PROT 6.3* 5.1*  ALBUMIN 3.3* 2.6*   No results for input(s): LIPASE, AMYLASE in the last 168 hours. No results for input(s): AMMONIA in the last 168 hours. Coagulation Profile: No results for input(s): INR, PROTIME in the last 168 hours. Cardiac Enzymes: No results for input(s): CKTOTAL, CKMB, CKMBINDEX, TROPONINI in the last 168 hours. BNP (last 3 results) No results for input(s): PROBNP in the last 8760 hours. HbA1C: No results for input(s): HGBA1C in the last 72 hours. CBG: Recent Labs  Lab 09/02/21 2234 09/03/21 0646  GLUCAP 152* 124*   Lipid Profile: No results for input(s): CHOL, HDL, LDLCALC, TRIG, CHOLHDL, LDLDIRECT in the last 72 hours. Thyroid Function Tests: No results for input(s): TSH, T4TOTAL, FREET4, T3FREE, THYROIDAB in the last 72 hours. Anemia Panel: No results for input(s): VITAMINB12, FOLATE, FERRITIN, TIBC, IRON, RETICCTPCT in the last 72 hours. Sepsis Labs: Recent Labs  Lab 09/02/21 1700 09/03/21 0122 09/03/21 0335  PROCALCITON  --  1.32  --   LATICACIDVEN 4.1* 2.3* 1.5    Recent Results (from the past 240 hour(s))  Resp Panel by RT-PCR (Flu A&B, Covid) Nasopharyngeal Swab     Status: Abnormal   Collection Time: 09/02/21  5:10 PM   Specimen: Nasopharyngeal Swab;  Nasopharyngeal(NP) swabs in vial transport medium  Result Value Ref Range Status   SARS Coronavirus 2 by RT PCR POSITIVE (A) NEGATIVE Final    Comment: (NOTE) SARS-CoV-2 target nucleic acids are DETECTED.  The SARS-CoV-2 RNA is generally detectable in upper respiratory specimens during the acute phase of infection. Positive results are indicative of the presence of the identified virus, but do not rule out bacterial infection or co-infection with other pathogens not detected by the test. Clinical correlation with patient history and other diagnostic information is necessary to determine patient infection status. The expected result is Negative.  Fact Sheet for Patients: EntrepreneurPulse.com.au  Fact Sheet for Healthcare Providers: IncredibleEmployment.be  This test is not yet approved or cleared by the [place] FDA and  has been authorized for detection and/or diagnosis of SARS-CoV-2 by FDA under an Emergency Use Authorization (EUA).  This EUA will remain in effect (meaning this test can be used) for the duration of  the COVID-19 declaration under Section 564(b)(1) of the A ct, 21 U.S.C. section 360bbb-3(b)(1), unless the authorization is  terminated or revoked sooner.     Influenza A by PCR NEGATIVE NEGATIVE Final   Influenza B by PCR NEGATIVE NEGATIVE Final    Comment: (NOTE) The Xpert Xpress SARS-CoV-2/FLU/RSV plus assay is intended as an aid in the diagnosis of influenza from Nasopharyngeal swab specimens and should not be used as a sole basis for treatment. Nasal washings and aspirates are unacceptable for Xpert Xpress SARS-CoV-2/FLU/RSV testing.  Fact Sheet for Patients: EntrepreneurPulse.com.au  Fact Sheet for Healthcare Providers: IncredibleEmployment.be  This test is not yet approved or cleared by the [place] FDA and has been authorized for detection and/or diagnosis of SARS-CoV-2  by FDA under an Emergency Use Authorization (EUA). This EUA will remain in effect (meaning this test can be used) for the duration of the COVID-19 declaration under Section 564(b)(1) of the Act, 21 U.S.C. section 360bbb-3(b)(1), unless the authorization is terminated or revoked.  Performed at [facility], [place] [address] [place], [place] [address]          Radiology Studies: CT PELVIS WO CONTRAST  Result Date: 09/03/2021 CLINICAL DATA:  65 year old male with history of perineum infection in October. Query anorectal abscess. EXAM: CT PELVIS WITHOUT CONTRAST TECHNIQUE: Multidetector CT imaging of the pelvis was performed following the standard protocol without intravenous contrast. COMPARISON:  CT Abdomen and Pelvis 06/13/2021. FINDINGS: Urinary Tract: Visible distal ureters are within normal limits. Bladder is within normal limits today. Bowel: The rectum is less distended, which might explain the appearance of mild circumferential rectal wall thickening (series 3, image 47). No convincing perirectal inflammation. No discrete perianal abscess or inflammation. Sigmoid colon anastomosis with no adverse features. Upstream diverticulosis of the descending colon. Small bowel anastomosis in the right abdomen on series 3, image 7 with no adverse features. No dilated small bowel is visible. Negative visible right colon and appendix (coronal image 44). Postoperative changes to the ventral abdominal wall with small fat containing periumbilical hernias (series 3, image 4). No free air or free fluid. Vascular/Lymphatic: Extensive Aortoiliac calcified atherosclerosis. No lymphadenopathy. Reproductive: Previously seen penile implant has been explanted. Perineum inflammation seen previously appears resolved. Mild residual soft tissue scarring. Scrotum remarkable for evidence of bilateral hydrocele and some dystrophic bilateral scrotal calcifications. No soft tissue gas. Other:  No pelvic free fluid.  Musculoskeletal: Multilevel chronic lower lumbar decompression and fusion. Solid appearing L4-L5 and L5-S1 arthrodesis. Osteopenia. Sacrum, SI joints, pelvis and proximal femurs appear intact. IMPRESSION: 1. No perineal or perianal inflammation or abscess identified. Circumferential rectal wall thickening which might be artifact due to under distension, but Acute Proctitis cannot be excluded. 2. Explanted penile implant since October. Bilateral scrotal hydrocele and dystrophic calcifications. 3. No other acute or inflammatory process identified. Prior small and large bowel surgery. Diverticulosis of the descending colon. Aortic Atherosclerosis (ICD10-I70.0). Electronically Signed   By: Genevie Ann M.D.   On: 09/03/2021 05:29   MR FOOT LEFT WO CONTRAST  Result Date: 09/03/2021 CLINICAL DATA:  Foot swelling, nondiabetic, osteomyelitis suspected EXAM: MRI OF THE LEFT FOOT WITHOUT CONTRAST TECHNIQUE: Multiplanar, multisequence MR imaging of the left forefoot was performed. No intravenous contrast was administered. COMPARISON:  06/16/2021, 06/13/2021 FINDINGS: Technical Note: Despite efforts by the technologist and patient, motion artifact is present on today's exam and could not be eliminated. This reduces exam sensitivity and specificity. Bones/Joint/Cartilage Patient is status post fifth ray resection as well as fourth toe amputation at the level of the fourth MTP joint. Findings of acute osteomyelitis involving the plantar-lateral aspect of the  cuboid adjacent to soft tissue ulceration. There is also acute osteomyelitis with small erosion involving the fourth metatarsal base. Additional findings of likely early acute osteomyelitis involving the peripheral aspect of the lateral cuneiform and adjacent third metatarsal base. Evaluation of the distal phalanxes of the toes is limited secondary to artifact at the edge of the field of view and motion degradation. No acute fracture or dislocation.  Similar degenerative  changes. Ligaments Intact Lisfranc ligament. Muscles and Tendons Amputation changes of the musculotendinous structures at the lateral forefoot. Advanced chronic denervation changes of the remaining musculature. No large tenosynovial fluid collection. Soft tissues Wound/ulceration at the lateral aspect of the foot adjacent to the fourth TMT joint. Multiple foci of susceptibility within the soft tissues along the lateral margin of the foot compatible with soft tissue air. No fluid collections. IMPRESSION: 1. Wound/ulceration at the lateral aspect of the foot adjacent to the fourth TMT joint. 2. Findings of acute osteomyelitis involving the cuboid and adjacent fourth metatarsal base. 3. Additional findings suggest early acute osteomyelitis involving the peripheral aspect of the lateral cuneiform and third metatarsal base. 4. Multiple foci of susceptibility within the soft tissues along the lateral margin of the foot compatible with soft tissue air, which may be secondary to open wound or infection with gas-forming organism. Electronically Signed   By: Davina Poke D.O.   On: 09/03/2021 10:25   DG Chest Port 1 View  Result Date: 09/02/2021 CLINICAL DATA:  Altered mental status, fever EXAM: PORTABLE CHEST 1 VIEW COMPARISON:  June 13, 2021 FINDINGS: The heart size and mediastinal contours are within normal limits. Aortic atherosclerosis. Hazy bibasilar opacities may reflect atelectasis or infiltrate. No visible pleural effusion or pneumothorax. Partially visualized cervical fusion hardware. IMPRESSION: Hazy bibasilar opacities may reflect atelectasis or infiltrate. Electronically Signed   By: Dahlia Bailiff M.D.   On: 09/02/2021 17:27        Scheduled Meds:  apixaban  5 mg Oral BID   atorvastatin  40 mg Oral QHS   famotidine  40 mg Oral Daily   [START ON 09/04/2021] fentaNYL  1 patch Transdermal Q72H   insulin aspart  0-6 Units Subcutaneous TID WC   insulin glargine-yfgn  10 Units Subcutaneous QHS    loratadine  10 mg Oral Daily   omega-3 acid ethyl esters  2 g Oral BID   pantoprazole  40 mg Oral Daily   pregabalin  75 mg Oral QHS   rOPINIRole  2 mg Oral QHS   tamsulosin  0.4 mg Oral Daily   traZODone  50 mg Oral Once   Continuous Infusions:  [START ON 09/05/2021] ceFEPime (MAXIPIME) IV     metronidazole Stopped (09/03/21 0650)   remdesivir 100 mg in NS 100 mL 100 mg (09/03/21 1001)   [START ON 09/05/2021] vancomycin       LOS: 1 day    Time spent: 32 minutes    Sharen Hones, MD [facility]   To contact the attending provider between 7A-7P or the covering provider during after hours 7P-7A, please log into the web site [email or web address] and access using universal [place] password for that web site. If you do not have the password, please call the hospital operator.  09/03/2021, 12:50 PM

## 2021-09-03 NOTE — Progress Notes (Signed)
Pharmacy Antibiotic Note  [name] is a 65 y.o. male admitted on 09/02/2021 with sepsis.  Pharmacy has been consulted for Vancomycin and Cefepime dosing x 7 days.  Plan: Cefepime 2gm now then 2gm qHD Vancomycin 2000mg  IV now then 1000 mg IV Q HD  Will f/u HD schedule, micro data, and pt's clinical condition Vanc levels prn   Height: 6\' 2"  (188 cm) Weight: 110.3 kg (243 lb 2.7 oz) IBW/kg (Calculated) : 82.2  Temp (24hrs), Avg:103.1 F (39.5 C), Min:103 F (39.4 C), Max:103.1 F (39.5 C)  Recent Labs  Lab 09/02/21 1700 09/02/21 1718  WBC 7.1  --   CREATININE 3.24* 3.10*  LATICACIDVEN 4.1*  --     Estimated Creatinine Clearance: 30.5 mL/min (A) (by C-G formula based on SCr of 3.1 mg/dL (H)).    Allergies  Allergen Reactions   Contrast Media [Iodinated Contrast Media] Shortness Of Breath and Other (See Comments)    Difficulty breathing and altered mental status     Ivp Dye [Iodinated Contrast Media] Anaphylaxis, Shortness Of Breath and Other (See Comments)    Breathing problems, altered mental state    Adhesive [Tape] Rash and Other (See Comments)    Rash after 1 day of use   Latex Rash and Other (See Comments)    A severe rash appears after the first 24 hours of being placed    Antimicrobials this admission: 12/25 Vanc >>  12/25 Cefepime >>   Microbiology results: 12/24 BCx:   Thank you for allowing pharmacy to be a part of this patients care.  Sherlon Handing, PharmD, BCPS Please see amion for complete clinical pharmacist phone list 09/03/2021 12:05 AM

## 2021-09-04 DIAGNOSIS — M86172 Other acute osteomyelitis, left ankle and foot: Secondary | ICD-10-CM | POA: Diagnosis not present

## 2021-09-04 DIAGNOSIS — Z992 Dependence on renal dialysis: Secondary | ICD-10-CM | POA: Diagnosis not present

## 2021-09-04 DIAGNOSIS — U071 COVID-19: Secondary | ICD-10-CM | POA: Diagnosis not present

## 2021-09-04 DIAGNOSIS — A419 Sepsis, unspecified organism: Secondary | ICD-10-CM | POA: Diagnosis not present

## 2021-09-04 DIAGNOSIS — N186 End stage renal disease: Secondary | ICD-10-CM | POA: Diagnosis not present

## 2021-09-04 LAB — CBC WITH DIFFERENTIAL/PLATELET
Abs Immature Granulocytes: 0.02 10*3/uL (ref 0.00–0.07)
Basophils Absolute: 0 10*3/uL (ref 0.0–0.1)
Basophils Relative: 1 %
Eosinophils Absolute: 0 10*3/uL (ref 0.0–0.5)
Eosinophils Relative: 1 %
HCT: 30.7 % — ABNORMAL LOW (ref 39.0–52.0)
Hemoglobin: 10.4 g/dL — ABNORMAL LOW (ref 13.0–17.0)
Immature Granulocytes: 1 %
Lymphocytes Relative: 21 %
Lymphs Abs: 0.9 10*3/uL (ref 0.7–4.0)
MCH: 31 pg (ref 26.0–34.0)
MCHC: 33.9 g/dL (ref 30.0–36.0)
MCV: 91.6 fL (ref 80.0–100.0)
Monocytes Absolute: 0.6 10*3/uL (ref 0.1–1.0)
Monocytes Relative: 15 %
Neutro Abs: 2.6 10*3/uL (ref 1.7–7.7)
Neutrophils Relative %: 61 %
Platelets: 93 10*3/uL — ABNORMAL LOW (ref 150–400)
RBC: 3.35 MIL/uL — ABNORMAL LOW (ref 4.22–5.81)
RDW: 14.9 % (ref 11.5–15.5)
WBC: 4.2 10*3/uL (ref 4.0–10.5)
nRBC: 0 % (ref 0.0–0.2)

## 2021-09-04 LAB — GLUCOSE, CAPILLARY
Glucose-Capillary: 133 mg/dL — ABNORMAL HIGH (ref 70–99)
Glucose-Capillary: 205 mg/dL — ABNORMAL HIGH (ref 70–99)
Glucose-Capillary: 108 mg/dL — ABNORMAL HIGH (ref 70–99)
Glucose-Capillary: 178 mg/dL — ABNORMAL HIGH (ref 70–99)

## 2021-09-04 LAB — COMPREHENSIVE METABOLIC PANEL
ALT: 11 U/L (ref 0–44)
AST: 17 U/L (ref 15–41)
Albumin: 2.6 g/dL — ABNORMAL LOW (ref 3.5–5.0)
Alkaline Phosphatase: 84 U/L (ref 38–126)
Anion gap: 13 (ref 5–15)
BUN: 32 mg/dL — ABNORMAL HIGH (ref 8–23)
CO2: 25 mmol/L (ref 22–32)
Calcium: 8 mg/dL — ABNORMAL LOW (ref 8.9–10.3)
Chloride: 99 mmol/L (ref 98–111)
Creatinine, Ser: 5.24 mg/dL — ABNORMAL HIGH (ref 0.61–1.24)
GFR, Estimated: 11 mL/min — ABNORMAL LOW (ref >60)
Glucose, Bld: 122 mg/dL — ABNORMAL HIGH (ref 70–99)
Potassium: 3.9 mmol/L (ref 3.5–5.1)
Sodium: 137 mmol/L (ref 135–145)
Total Bilirubin: 0.4 mg/dL (ref 0.3–1.2)
Total Protein: 5.3 g/dL — ABNORMAL LOW (ref 6.5–8.1)

## 2021-09-04 LAB — HEPATITIS B SURFACE ANTIGEN: Hepatitis B Surface Ag: NONREACTIVE

## 2021-09-04 LAB — PHOSPHORUS: Phosphorus: 6.3 mg/dL — ABNORMAL HIGH (ref 2.5–4.6)

## 2021-09-04 LAB — D-DIMER, QUANTITATIVE: D-Dimer, Quant: 0.43 ug/mL-FEU (ref 0.00–0.50)

## 2021-09-04 LAB — C-REACTIVE PROTEIN: CRP: 10.7 mg/dL — ABNORMAL HIGH (ref <1.0)

## 2021-09-04 LAB — HEPATITIS B SURFACE ANTIBODY,QUALITATIVE: Hep B S Ab: REACTIVE — AB

## 2021-09-04 MED ORDER — FAMOTIDINE 20 MG PO TABS
20.0000 mg | ORAL_TABLET | Freq: Every day | ORAL | Status: DC
  Administered 2021-09-05 – 2021-09-07 (×3): 20 mg via ORAL
  Filled 2021-09-04 (×3): qty 1

## 2021-09-04 NOTE — Progress Notes (Signed)
ID PROGRESS NOTE    [name]  TWS:[id number] DOB: 23-Jun-1956 DOA: 09/02/2021 PCP: [name], PA-C   Chief complaint.  Fever. Brief Narrative:  [name] is a 65 y.o. male with history of ESRD on hemodialysis, atrial fibrillation, diabetes mellitus type 2 while in the dialysis was found to be confused and febrile and having rigors was brought to the ER.  He had a temperature of 103, heart rate of 130, he was COVID-positive.  His lactic acid was 4.1 at the time admission.  He also has chronic left foot wound which was followed by orthopedics. He was not hypoxemic, he is placed on cefepime and vancomycin for sepsis   Assessment & Plan:   Principal Problem:   Sepsis ([place]) Active Problems:   CAD (dz of distal, mid and proximal RCA with implantation of 3 overlapping drug-eluting stent,)   Insulin dependent type 2 diabetes mellitus ([place])   Acute osteomyelitis of left foot (HCC)   S/P BKA (below knee amputation), right (HCC)   S/P carotid endarterectomy   OSA on CPAP   Chronic back pain   Chronic obstructive pulmonary disease, unspecified ([place])   ESRD on hemodialysis ([place])   COVID  Sepsis. Thrombocytopenia secondary to sepsis. Left foot chronic wound with acute on chronic osteomyelitis. Patient condition appears to be improving, appreciate consult. Will continue vancomycin for 6 weeks per recommendation from ID.  This can be administrated after each dialysis. Orthopedics will see patient tomorrow.  COVID-19 pneumonia. Continue remdesivir, patient still does not have hypoxemia.  Uncontrolled type 2 diabetes with hyperglycemia Continue current regimen.  Peripheral vascular disease status post revascularization. Coronary artery disease status post stenting. Conditions are stable.  End-stage renal disease. Anemia of chronic kidney disease. Continue dialysis per nephrology  Obstructive sleep apnea. Continue CPAP   Paroxysmal atrial fibrillation. Continue  Eliquis.    DVT prophylaxis: Eliquis Code Status: full Family Communication:  Disposition Plan:    Status is: Inpatient  Remains inpatient appropriate because: For severity of disease and IV antibiotics.  Plan to discharge tomorrow after seen by orthopedics.        I/O last 3 completed shifts: In: 3114.6 [P.O.:1096; IV Piggyback:2018.6] Out: 845 [Urine:845] Total I/O In: 360 [P.O.:360] Out: 0      Consultants:  Nephrology and orthopedics  Procedures: None  Antimicrobials: Vancomycin.  Subjective: Patient doing better today, he has no confusion. Denies any short of breath or cough, no hypoxia. No dysuria hematuria  No fever or chills.  Objective: Vitals:   09/04/21 0453 09/04/21 0500 09/04/21 0845 09/04/21 1202  BP: (!) 174/43  134/66 (!) 148/63  Pulse: 74 73 86 87  Resp: 17 17 19 19   Temp: 98.3 F (36.8 C)  98.4 F (36.9 C) 98 F (36.7 C)  TempSrc: Oral  Oral Oral  SpO2: 100% 98% 97% 99%  Weight:  110.8 kg    Height:        Intake/Output Summary (Last 24 hours) at 09/04/2021 1311 Last data filed at 09/04/2021 1115 Gross per 24 hour  Intake 930.07 ml  Output 0 ml  Net 930.07 ml   Filed Weights   09/02/21 1702 09/02/21 2228 09/04/21 0500  Weight: 108.9 kg 110.3 kg 110.8 kg    Examination:  General exam: Appears calm and comfortable  Respiratory system: Clear to auscultation. Respiratory effort normal. Cardiovascular system: S1 & S2 heard, RRR. No JVD, murmurs, rubs, gallops or clicks. No pedal edema. Gastrointestinal system: Abdomen is nondistended, soft and nontender. No  organomegaly or masses felt. Normal bowel sounds heard. Central nervous system: Alert and oriented. No focal neurological deficits. Extremities: Left foot wound. Skin: No rashes, lesions or ulcers Psychiatry: Judgement and insight appear normal. Mood & affect appropriate.     Data Reviewed: I have personally reviewed following labs and imaging studies  CBC: Recent  Labs  Lab 09/02/21 1700 09/02/21 1718 09/03/21 0122 09/04/21 0351  WBC 7.1  --  5.4 4.2  NEUTROABS 6.0  --  4.2 2.6  HGB 11.9* 12.2* 9.2* 10.4*  HCT 35.1* 36.0* 28.0* 30.7*  MCV 90.7  --  92.1 91.6  PLT 108*  --  90* 93*   Basic Metabolic Panel: Recent Labs  Lab 09/02/21 1700 09/02/21 1718 09/03/21 0122 09/04/21 0351  NA 139 137 137 137  K 3.8 3.8 3.7 3.9  CL 96* 94* 98 99  CO2 29  --  30 25  GLUCOSE 265* 262* 228* 122*  BUN 13 14 17  32*  CREATININE 3.24* 3.10* 4.01* 5.24*  CALCIUM 8.2*  --  7.5* 8.0*  PHOS  --   --   --  6.3*   GFR: Estimated Creatinine Clearance: 18.1 mL/min (A) (by C-G formula based on SCr of 5.24 mg/dL (H)). Liver Function Tests: Recent Labs  Lab 09/02/21 1700 09/03/21 0122 09/04/21 0351  AST 20 15 17   ALT 11 11 11   ALKPHOS 129* 93 84  BILITOT 0.6 0.5 0.4  PROT 6.3* 5.1* 5.3*  ALBUMIN 3.3* 2.6* 2.6*   No results for input(s): LIPASE, AMYLASE in the last 168 hours. No results for input(s): AMMONIA in the last 168 hours. Coagulation Profile: No results for input(s): INR, PROTIME in the last 168 hours. Cardiac Enzymes: No results for input(s): CKTOTAL, CKMB, CKMBINDEX, TROPONINI in the last 168 hours. BNP (last 3 results) No results for input(s): PROBNP in the last 8760 hours. HbA1C: No results for input(s): HGBA1C in the last 72 hours. CBG: Recent Labs  Lab 09/03/21 1254 09/03/21 1633 09/03/21 2154 09/04/21 0656 09/04/21 1155  GLUCAP 100* 157* 116* 133* 205*   Lipid Profile: No results for input(s): CHOL, HDL, LDLCALC, TRIG, CHOLHDL, LDLDIRECT in the last 72 hours. Thyroid Function Tests: No results for input(s): TSH, T4TOTAL, FREET4, T3FREE, THYROIDAB in the last 72 hours. Anemia Panel: No results for input(s): VITAMINB12, FOLATE, FERRITIN, TIBC, IRON, RETICCTPCT in the last 72 hours. Sepsis Labs: Recent Labs  Lab 09/02/21 1700 09/03/21 0122 09/03/21 0335  PROCALCITON  --  1.32  --   LATICACIDVEN 4.1* 2.3* 1.5     Recent Results (from the past 240 hour(s))  Blood culture (routine x 2)     Status: None (Preliminary result)   Collection Time: 09/02/21  5:00 PM   Specimen: BLOOD  Result Value Ref Range Status   Specimen Description BLOOD RIGHT ANTECUBITAL  Final   Special Requests   Final    BOTTLES DRAWN AEROBIC AND ANAEROBIC Blood Culture results may not be optimal due to an excessive volume of blood received in culture bottles   Culture   Final    NO GROWTH 2 DAYS Performed at [facility] [address] [place], [place] [address]    Report Status PENDING  Incomplete  Resp Panel by RT-PCR (Flu A&B, Covid) Nasopharyngeal Swab     Status: Abnormal   Collection Time: 09/02/21  5:10 PM   Specimen: Nasopharyngeal Swab; Nasopharyngeal(NP) swabs in vial transport medium  Result Value Ref Range Status   SARS Coronavirus 2 by RT PCR  POSITIVE (A) NEGATIVE Final    Comment: (NOTE) SARS-CoV-2 target nucleic acids are DETECTED.  The SARS-CoV-2 RNA is generally detectable in upper respiratory specimens during the acute phase of infection. Positive results are indicative of the presence of the identified virus, but do not rule out bacterial infection or co-infection with other pathogens not detected by the test. Clinical correlation with patient history and other diagnostic information is necessary to determine patient infection status. The expected result is Negative.  Fact Sheet for Patients: EntrepreneurPulse.com.au  Fact Sheet for Healthcare Providers: IncredibleEmployment.be  This test is not yet approved or cleared by the [place] FDA and  has been authorized for detection and/or diagnosis of SARS-CoV-2 by FDA under an Emergency Use Authorization (EUA).  This EUA will remain in effect (meaning this test can be used) for the duration of  the COVID-19 declaration under Section 564(b)(1) of the A ct, 21 U.S.C. section 360bbb-3(b)(1), unless the  authorization is terminated or revoked sooner.     Influenza A by PCR NEGATIVE NEGATIVE Final   Influenza B by PCR NEGATIVE NEGATIVE Final    Comment: (NOTE) The Xpert Xpress SARS-CoV-2/FLU/RSV plus assay is intended as an aid in the diagnosis of influenza from Nasopharyngeal swab specimens and should not be used as a sole basis for treatment. Nasal washings and aspirates are unacceptable for Xpert Xpress SARS-CoV-2/FLU/RSV testing.  Fact Sheet for Patients: EntrepreneurPulse.com.au  Fact Sheet for Healthcare Providers: IncredibleEmployment.be  This test is not yet approved or cleared by the [place] FDA and has been authorized for detection and/or diagnosis of SARS-CoV-2 by FDA under an Emergency Use Authorization (EUA). This EUA will remain in effect (meaning this test can be used) for the duration of the COVID-19 declaration under Section 564(b)(1) of the Act, 21 U.S.C. section 360bbb-3(b)(1), unless the authorization is terminated or revoked.  Performed at [facility], [place] [address], [place], [place] [address]   Blood culture (routine x 2)     Status: None (Preliminary result)   Collection Time: 09/02/21  5:38 PM   Specimen: BLOOD  Result Value Ref Range Status   Specimen Description BLOOD SITE NOT SPECIFIED  Final   Special Requests   Final    BOTTLES DRAWN AEROBIC AND ANAEROBIC Blood Culture adequate volume   Culture   Final    NO GROWTH 2 DAYS Performed at [facility], [address], [place], [place] [address]    Report Status PENDING  Incomplete         Radiology Studies: CT PELVIS WO CONTRAST  Result Date: 09/03/2021 CLINICAL DATA:  65 year old male with history of perineum infection in October. Query anorectal abscess. EXAM: CT PELVIS WITHOUT CONTRAST TECHNIQUE: Multidetector CT imaging of the pelvis was performed following the standard protocol without intravenous contrast. COMPARISON:  CT Abdomen  and Pelvis 06/13/2021. FINDINGS: Urinary Tract: Visible distal ureters are within normal limits. Bladder is within normal limits today. Bowel: The rectum is less distended, which might explain the appearance of mild circumferential rectal wall thickening (series 3, image 47). No convincing perirectal inflammation. No discrete perianal abscess or inflammation. Sigmoid colon anastomosis with no adverse features. Upstream diverticulosis of the descending colon. Small bowel anastomosis in the right abdomen on series 3, image 7 with no adverse features. No dilated small bowel is visible. Negative visible right colon and appendix (coronal image 44). Postoperative changes to the ventral abdominal wall with small fat containing periumbilical hernias (series 3, image 4). No free air or  free fluid. Vascular/Lymphatic: Extensive Aortoiliac calcified atherosclerosis. No lymphadenopathy. Reproductive: Previously seen penile implant has been explanted. Perineum inflammation seen previously appears resolved. Mild residual soft tissue scarring. Scrotum remarkable for evidence of bilateral hydrocele and some dystrophic bilateral scrotal calcifications. No soft tissue gas. Other:  No pelvic free fluid. Musculoskeletal: Multilevel chronic lower lumbar decompression and fusion. Solid appearing L4-L5 and L5-S1 arthrodesis. Osteopenia. Sacrum, SI joints, pelvis and proximal femurs appear intact. IMPRESSION: 1. No perineal or perianal inflammation or abscess identified. Circumferential rectal wall thickening which might be artifact due to under distension, but Acute Proctitis cannot be excluded. 2. Explanted penile implant since October. Bilateral scrotal hydrocele and dystrophic calcifications. 3. No other acute or inflammatory process identified. Prior small and large bowel surgery. Diverticulosis of the descending colon. Aortic Atherosclerosis (ICD10-I70.0). Electronically Signed   By: Genevie Ann M.D.   On: 09/03/2021 05:29   MR FOOT  LEFT WO CONTRAST  Result Date: 09/03/2021 CLINICAL DATA:  Foot swelling, nondiabetic, osteomyelitis suspected EXAM: MRI OF THE LEFT FOOT WITHOUT CONTRAST TECHNIQUE: Multiplanar, multisequence MR imaging of the left forefoot was performed. No intravenous contrast was administered. COMPARISON:  06/16/2021, 06/13/2021 FINDINGS: Technical Note: Despite efforts by the technologist and patient, motion artifact is present on today's exam and could not be eliminated. This reduces exam sensitivity and specificity. Bones/Joint/Cartilage Patient is status post fifth ray resection as well as fourth toe amputation at the level of the fourth MTP joint. Findings of acute osteomyelitis involving the plantar-lateral aspect of the cuboid adjacent to soft tissue ulceration. There is also acute osteomyelitis with small erosion involving the fourth metatarsal base. Additional findings of likely early acute osteomyelitis involving the peripheral aspect of the lateral cuneiform and adjacent third metatarsal base. Evaluation of the distal phalanxes of the toes is limited secondary to artifact at the edge of the field of view and motion degradation. No acute fracture or dislocation.  Similar degenerative changes. Ligaments Intact Lisfranc ligament. Muscles and Tendons Amputation changes of the musculotendinous structures at the lateral forefoot. Advanced chronic denervation changes of the remaining musculature. No large tenosynovial fluid collection. Soft tissues Wound/ulceration at the lateral aspect of the foot adjacent to the fourth TMT joint. Multiple foci of susceptibility within the soft tissues along the lateral margin of the foot compatible with soft tissue air. No fluid collections. IMPRESSION: 1. Wound/ulceration at the lateral aspect of the foot adjacent to the fourth TMT joint. 2. Findings of acute osteomyelitis involving the cuboid and adjacent fourth metatarsal base. 3. Additional findings suggest early acute osteomyelitis  involving the peripheral aspect of the lateral cuneiform and third metatarsal base. 4. Multiple foci of susceptibility within the soft tissues along the lateral margin of the foot compatible with soft tissue air, which may be secondary to open wound or infection with gas-forming organism. Electronically Signed   By: Davina Poke D.O.   On: 09/03/2021 10:25   DG Chest Port 1 View  Result Date: 09/02/2021 CLINICAL DATA:  Altered mental status, fever EXAM: PORTABLE CHEST 1 VIEW COMPARISON:  June 13, 2021 FINDINGS: The heart size and mediastinal contours are within normal limits. Aortic atherosclerosis. Hazy bibasilar opacities may reflect atelectasis or infiltrate. No visible pleural effusion or pneumothorax. Partially visualized cervical fusion hardware. IMPRESSION: Hazy bibasilar opacities may reflect atelectasis or infiltrate. Electronically Signed   By: Dahlia Bailiff M.D.   On: 09/02/2021 17:27        Scheduled Meds:  apixaban  5 mg Oral BID   atorvastatin  40  mg Oral QHS   [START ON 09/05/2021] darbepoetin (ARANESP) injection - DIALYSIS  60 mcg Intravenous Q Tue-HD   famotidine  40 mg Oral Daily   fentaNYL  1 patch Transdermal Q72H   insulin aspart  0-6 Units Subcutaneous TID WC   insulin glargine-yfgn  10 Units Subcutaneous QHS   loratadine  10 mg Oral Daily   omega-3 acid ethyl esters  2 g Oral BID   pantoprazole  40 mg Oral Daily   pregabalin  75 mg Oral QHS   rOPINIRole  2 mg Oral QHS   tamsulosin  0.4 mg Oral Daily   traZODone  50 mg Oral Once   Continuous Infusions:  [START ON 09/05/2021] vancomycin       LOS: 2 days    Time spent: 28 minutes    Sharen Hones, MD [facility]   To contact the attending provider between 7A-7P or the covering provider during after hours 7P-7A, please log into the web site [email or web address] and access using universal [place] password for that web site. If you do not have the password, please call the hospital  operator.  09/04/2021, 1:11 PM

## 2021-09-04 NOTE — Progress Notes (Signed)
[facility] Progress Note  Subjective: seen in room, no new c/o  Vitals:   09/04/21 0453 09/04/21 0500 09/04/21 0845 09/04/21 1202  BP: (!) 174/43  134/66 (!) 148/63  Pulse: 74 73 86 87  Resp: 17 17 19 19   Temp: 98.3 F (36.8 C)  98.4 F (36.9 C) 98 F (36.7 C)  TempSrc: Oral  Oral Oral  SpO2: 100% 98% 97% 99%  Weight:  110.8 kg    Height:        Exam:  alert, nad   no jvd  Chest faint R basilar rales, L clear  Cor reg no RG  Abd soft ntnd no ascites   Ext no LE edema   Alert, NF, ox3    L AVF+b/t     OP HD: [facility]  4h 13min  450/ 800  108 kg 2/2 bath  L AVF  Heparin none   - mircera 50 q2 last 12/13, due 12/27   - venofer 50 per week   Assessment/ Plan: ESRD - HD per TTS schedule - next tx on 12/27.  Covid positive - therapies per primary team, on remdesivir Hypertension -  improved control  Atrial fibrillation - Per primary team  Anemia CKD - ordered aranesp 60 mcg weekly to start 01/60 Metabolic bone disease - not on activated vit d outpt, phos 6.3, Ca ok   Rob Mccall Will 09/04/2021, 2:12 PM  Recent Labs  Lab 09/03/21 0122 09/04/21 0351  K 3.7 3.9  BUN 17 32*  CREATININE 4.01* 5.24*  CALCIUM 7.5* 8.0*  PHOS  --  6.3*  HGB 9.2* 10.4*   Inpatient medications:  apixaban  5 mg Oral BID   atorvastatin  40 mg Oral QHS   [START ON 09/05/2021] darbepoetin (ARANESP) injection - DIALYSIS  60 mcg Intravenous Q Tue-HD   famotidine  40 mg Oral Daily   fentaNYL  1 patch Transdermal Q72H   insulin aspart  0-6 Units Subcutaneous TID WC   insulin glargine-yfgn  10 Units Subcutaneous QHS   loratadine  10 mg Oral Daily   omega-3 acid ethyl esters  2 g Oral BID   pantoprazole  40 mg Oral Daily   pregabalin  75 mg Oral QHS   rOPINIRole  2 mg Oral QHS   tamsulosin  0.4 mg Oral Daily   traZODone  50 mg Oral Once    [START ON 09/05/2021] vancomycin     acetaminophen, hydrALAZINE, linaclotide, ondansetron (ZOFRAN) IV,  oxyCODONE-acetaminophen

## 2021-09-04 NOTE — Consult Note (Signed)
[place] [facility] Nurse Consult Note: Patient receiving care in [place]. Patient is Covid +.  Consult completed remotely after review of record. Reason for Consult: left lateral foot wound Wound type: Per Dr. Jess Barters most recent note, the wound is of the ischemic type. It is full thickness, see photo. Pressure Injury POA: Yes/No/NA Measurement: To be provided by the bedside RN in the flowsheet section  Wound bed: pale pink Drainage (amount, consistency, odor) to be determined Periwound: intact Dressing procedure/placement/frequency: Wash left foot wound with soap and water, pat dry. Place a size appropriate piece of Aquacel Advantage Kellie Simmering [phone]) over the wound, then dry gauze, secure with kerlix. Change daily.  Monitor the wound area(s) for worsening of condition such as: Signs/symptoms of infection,  Increase in size,  Development of or worsening of odor, Development of pain, or increased pain at the affected locations.  Notify the medical team if any of these develop.  Thank you for the consult. [place] nurse will not follow at this time.  Please re-consult the [place] team if needed.  Val Riles, RN, MSN, CWOCN, CNS-BC, pager [phone]

## 2021-09-04 NOTE — Progress Notes (Signed)
[facility] for Infectious Disease    Date of Admission:  09/02/2021   Total days of antibiotics 3/vanco/cefepime/remdesivir          ID: [name] is a 65 y.o. male with  CAD, PAD s/p right bka, left chronic foot ulcer found to have covid pneumonia nad acute on chronic osteo of left foot Principal Problem:   Sepsis ([place]) Active Problems:   CAD (dz of distal, mid and proximal RCA with implantation of 3 overlapping drug-eluting stent,)   Insulin dependent type 2 diabetes mellitus ([place])   Acute osteomyelitis of left foot (HCC)   S/P BKA (below knee amputation), right (HCC)   S/P carotid endarterectomy   OSA on CPAP   Chronic back pain   Chronic obstructive pulmonary disease, unspecified ([place])   ESRD on hemodialysis ([place])   COVID    Subjective: Afebrile, but still feels like subjective fevers. Mild shortness of breath   No pain to foot, no diarrhea, no loss of taste or smell  Medications:   apixaban  5 mg Oral BID   atorvastatin  40 mg Oral QHS   [START ON 09/05/2021] darbepoetin (ARANESP) injection - DIALYSIS  60 mcg Intravenous Q Tue-HD   famotidine  40 mg Oral Daily   fentaNYL  1 patch Transdermal Q72H   insulin aspart  0-6 Units Subcutaneous TID WC   insulin glargine-yfgn  10 Units Subcutaneous QHS   loratadine  10 mg Oral Daily   omega-3 acid ethyl esters  2 g Oral BID   pantoprazole  40 mg Oral Daily   pregabalin  75 mg Oral QHS   rOPINIRole  2 mg Oral QHS   tamsulosin  0.4 mg Oral Daily   traZODone  50 mg Oral Once    Objective: Vital signs in last 24 hours: Temp:  [98 F (36.7 C)-100.4 F (38 C)] 98 F (36.7 C) (12/26 1202) Pulse Rate:  [73-94] 87 (12/26 1202) Resp:  [17-20] 19 (12/26 1202) BP: (134-183)/(43-67) 148/63 (12/26 1202) SpO2:  [93 %-100 %] 99 % (12/26 1202) Weight:  [110.8 kg] 110.8 kg (12/26 0500) Physical Exam  Constitutional: He is oriented to person, place, and time. He appears well-developed and well-nourished. No distress.   HENT:  Mouth/Throat: Oropharynx is clear and moist. No oropharyngeal exudate.  Cardiovascular: Normal rate, regular rhythm and normal heart sounds. Exam reveals no gallop and no friction rub.  No murmur heard.  Pulmonary/Chest: Effort normal and breath sounds normal. No respiratory distress. He has no wheezes.  Abdominal: Soft. Bowel sounds are normal. He exhibits no distension. There is no tenderness.  Lymphadenopathy:  He has no cervical adenopathy.  TIR:WERXV bka with prosthesis on. Left foot wrapped no worsening drainage or erythema Neurological: He is alert and oriented to person, place, and time.  Skin: Skin is warm and dry. No rash noted. No erythema.  Psychiatric: He has a normal mood and affect. His behavior is normal.    Lab Results Recent Labs    09/03/21 0122 09/04/21 0351  WBC 5.4 4.2  HGB 9.2* 10.4*  HCT 28.0* 30.7*  NA 137 137  K 3.7 3.9  CL 98 99  CO2 30 25  BUN 17 32*  CREATININE 4.01* 5.24*   Liver Panel Recent Labs    09/03/21 0122 09/04/21 0351  PROT 5.1* 5.3*  ALBUMIN 2.6* 2.6*  AST 15 17  ALT 11 11  ALKPHOS 93 84  BILITOT 0.5 0.4   Sedimentation Rate Recent Labs  09/03/21 1600  ESRSEDRATE 40*   C-Reactive Protein Recent Labs    09/03/21 0612 09/04/21 0351  CRP 8.8* 10.7*    Microbiology: reviewed Studies/Results: CT PELVIS WO CONTRAST  Result Date: 09/03/2021 CLINICAL DATA:  65 year old male with history of perineum infection in October. Query anorectal abscess. EXAM: CT PELVIS WITHOUT CONTRAST TECHNIQUE: Multidetector CT imaging of the pelvis was performed following the standard protocol without intravenous contrast. COMPARISON:  CT Abdomen and Pelvis 06/13/2021. FINDINGS: Urinary Tract: Visible distal ureters are within normal limits. Bladder is within normal limits today. Bowel: The rectum is less distended, which might explain the appearance of mild circumferential rectal wall thickening (series 3, image 47). No convincing  perirectal inflammation. No discrete perianal abscess or inflammation. Sigmoid colon anastomosis with no adverse features. Upstream diverticulosis of the descending colon. Small bowel anastomosis in the right abdomen on series 3, image 7 with no adverse features. No dilated small bowel is visible. Negative visible right colon and appendix (coronal image 44). Postoperative changes to the ventral abdominal wall with small fat containing periumbilical hernias (series 3, image 4). No free air or free fluid. Vascular/Lymphatic: Extensive Aortoiliac calcified atherosclerosis. No lymphadenopathy. Reproductive: Previously seen penile implant has been explanted. Perineum inflammation seen previously appears resolved. Mild residual soft tissue scarring. Scrotum remarkable for evidence of bilateral hydrocele and some dystrophic bilateral scrotal calcifications. No soft tissue gas. Other:  No pelvic free fluid. Musculoskeletal: Multilevel chronic lower lumbar decompression and fusion. Solid appearing L4-L5 and L5-S1 arthrodesis. Osteopenia. Sacrum, SI joints, pelvis and proximal femurs appear intact. IMPRESSION: 1. No perineal or perianal inflammation or abscess identified. Circumferential rectal wall thickening which might be artifact due to under distension, but Acute Proctitis cannot be excluded. 2. Explanted penile implant since October. Bilateral scrotal hydrocele and dystrophic calcifications. 3. No other acute or inflammatory process identified. Prior small and large bowel surgery. Diverticulosis of the descending colon. Aortic Atherosclerosis (ICD10-I70.0). Electronically Signed   By: Genevie Ann M.D.   On: 09/03/2021 05:29   MR FOOT LEFT WO CONTRAST  Result Date: 09/03/2021 CLINICAL DATA:  Foot swelling, nondiabetic, osteomyelitis suspected EXAM: MRI OF THE LEFT FOOT WITHOUT CONTRAST TECHNIQUE: Multiplanar, multisequence MR imaging of the left forefoot was performed. No intravenous contrast was administered.  COMPARISON:  06/16/2021, 06/13/2021 FINDINGS: Technical Note: Despite efforts by the technologist and patient, motion artifact is present on today's exam and could not be eliminated. This reduces exam sensitivity and specificity. Bones/Joint/Cartilage Patient is status post fifth ray resection as well as fourth toe amputation at the level of the fourth MTP joint. Findings of acute osteomyelitis involving the plantar-lateral aspect of the cuboid adjacent to soft tissue ulceration. There is also acute osteomyelitis with small erosion involving the fourth metatarsal base. Additional findings of likely early acute osteomyelitis involving the peripheral aspect of the lateral cuneiform and adjacent third metatarsal base. Evaluation of the distal phalanxes of the toes is limited secondary to artifact at the edge of the field of view and motion degradation. No acute fracture or dislocation.  Similar degenerative changes. Ligaments Intact Lisfranc ligament. Muscles and Tendons Amputation changes of the musculotendinous structures at the lateral forefoot. Advanced chronic denervation changes of the remaining musculature. No large tenosynovial fluid collection. Soft tissues Wound/ulceration at the lateral aspect of the foot adjacent to the fourth TMT joint. Multiple foci of susceptibility within the soft tissues along the lateral margin of the foot compatible with soft tissue air. No fluid collections. IMPRESSION: 1. Wound/ulceration at the lateral aspect of  the foot adjacent to the fourth TMT joint. 2. Findings of acute osteomyelitis involving the cuboid and adjacent fourth metatarsal base. 3. Additional findings suggest early acute osteomyelitis involving the peripheral aspect of the lateral cuneiform and third metatarsal base. 4. Multiple foci of susceptibility within the soft tissues along the lateral margin of the foot compatible with soft tissue air, which may be secondary to open wound or infection with gas-forming  organism. Electronically Signed   By: Davina Poke D.O.   On: 09/03/2021 10:25   DG Chest Port 1 View  Result Date: 09/02/2021 CLINICAL DATA:  Altered mental status, fever EXAM: PORTABLE CHEST 1 VIEW COMPARISON:  June 13, 2021 FINDINGS: The heart size and mediastinal contours are within normal limits. Aortic atherosclerosis. Hazy bibasilar opacities may reflect atelectasis or infiltrate. No visible pleural effusion or pneumothorax. Partially visualized cervical fusion hardware. IMPRESSION: Hazy bibasilar opacities may reflect atelectasis or infiltrate. Electronically Signed   By: Dahlia Bailiff M.D.   On: 09/02/2021 17:27     Assessment/Plan: Covid-illness = continue with remdesivir and supportive care  Acute on chronic Osteomyelitis of left foot = will narrow abtx to vancomycin (hx of colonized with MRSA), to be given post HD. Will give x 6 wk through feb 4th. Continue with wound care recs with aquacel and follow up with dr duda.   ESRD on HD = plan on giving vancomycin per renal dosing/post HD.  Will get follow up appt in ID clinic in 4-6 wk. Will sign off.  [facility] for Infectious Diseases Pager: [phone]  09/04/2021, 12:51 PM

## 2021-09-04 NOTE — Progress Notes (Signed)
°  Transition of Care [facility]) Screening Note   Patient Details  Name: [name] Date of Birth: 03-28-1956   Transition of Care [facility]) CM/SW Contact:    Bartholomew Crews, RN Phone Number: [phone] 09/04/2021, 10:07 AM    Transition of Care Department [facility]) has reviewed patient and no TOC needs have been identified at this time. We will continue to monitor patient advancement through interdisciplinary progression rounds. If new patient transition needs arise, please place a TOC consult.

## 2021-09-04 NOTE — Progress Notes (Signed)
BP 174-183 / 43-59 mmHg. Hydralazine 10 mg IV given PRN. Sinus rhythm with occasional PAC on monitor, HR 70s-80s. Temp 98.3-100.4 F, Tylenol given for fever. RR 17-19, no SOB, SPO2 98-100%. Denied productive cough.   His legs pain tolerated well. Percocet given PRN.  Left leg non healing wound dressing dry and clean. Dressing changed by RN day shift.  Pt has prior admission stage 2 pressure ulcer with rash under his sacral area. We cleaned and put moisture barrier and sacral foam. Wound care consulted for evaluation. Encouraged position changing q 2 hr.  We will continue to monitor.  Kennyth Lose, RN

## 2021-09-05 ENCOUNTER — Inpatient Hospital Stay (HOSPITAL_COMMUNITY): Payer: 59

## 2021-09-05 DIAGNOSIS — N186 End stage renal disease: Secondary | ICD-10-CM | POA: Diagnosis not present

## 2021-09-05 DIAGNOSIS — G9341 Metabolic encephalopathy: Secondary | ICD-10-CM

## 2021-09-05 DIAGNOSIS — M86172 Other acute osteomyelitis, left ankle and foot: Secondary | ICD-10-CM | POA: Diagnosis not present

## 2021-09-05 DIAGNOSIS — A419 Sepsis, unspecified organism: Secondary | ICD-10-CM | POA: Diagnosis not present

## 2021-09-05 DIAGNOSIS — U071 COVID-19: Secondary | ICD-10-CM | POA: Diagnosis not present

## 2021-09-05 LAB — COMPREHENSIVE METABOLIC PANEL
ALT: 9 U/L (ref 0–44)
AST: 17 U/L (ref 15–41)
Albumin: 2.7 g/dL — ABNORMAL LOW (ref 3.5–5.0)
Alkaline Phosphatase: 89 U/L (ref 38–126)
Anion gap: 14 (ref 5–15)
BUN: 43 mg/dL — ABNORMAL HIGH (ref 8–23)
CO2: 21 mmol/L — ABNORMAL LOW (ref 22–32)
Calcium: 7.9 mg/dL — ABNORMAL LOW (ref 8.9–10.3)
Chloride: 100 mmol/L (ref 98–111)
Creatinine, Ser: 5.74 mg/dL — ABNORMAL HIGH (ref 0.61–1.24)
GFR, Estimated: 10 mL/min — ABNORMAL LOW (ref >60)
Glucose, Bld: 129 mg/dL — ABNORMAL HIGH (ref 70–99)
Potassium: 3.4 mmol/L — ABNORMAL LOW (ref 3.5–5.1)
Sodium: 135 mmol/L (ref 135–145)
Total Bilirubin: 0.8 mg/dL (ref 0.3–1.2)
Total Protein: 5.1 g/dL — ABNORMAL LOW (ref 6.5–8.1)

## 2021-09-05 LAB — CBC WITH DIFFERENTIAL/PLATELET
Abs Immature Granulocytes: 0 10*3/uL (ref 0.00–0.07)
Basophils Absolute: 0 10*3/uL (ref 0.0–0.1)
Basophils Relative: 1 %
Eosinophils Absolute: 0.1 10*3/uL (ref 0.0–0.5)
Eosinophils Relative: 3 %
HCT: 30.1 % — ABNORMAL LOW (ref 39.0–52.0)
Hemoglobin: 10.2 g/dL — ABNORMAL LOW (ref 13.0–17.0)
Immature Granulocytes: 0 %
Lymphocytes Relative: 28 %
Lymphs Abs: 1.1 10*3/uL (ref 0.7–4.0)
MCH: 30.4 pg (ref 26.0–34.0)
MCHC: 33.9 g/dL (ref 30.0–36.0)
MCV: 89.9 fL (ref 80.0–100.0)
Monocytes Absolute: 0.5 10*3/uL (ref 0.1–1.0)
Monocytes Relative: 12 %
Neutro Abs: 2.1 10*3/uL (ref 1.7–7.7)
Neutrophils Relative %: 56 %
Platelets: 103 10*3/uL — ABNORMAL LOW (ref 150–400)
RBC: 3.35 MIL/uL — ABNORMAL LOW (ref 4.22–5.81)
RDW: 14.5 % (ref 11.5–15.5)
WBC: 3.8 10*3/uL — ABNORMAL LOW (ref 4.0–10.5)
nRBC: 0 % (ref 0.0–0.2)

## 2021-09-05 LAB — GLUCOSE, CAPILLARY
Glucose-Capillary: 117 mg/dL — ABNORMAL HIGH (ref 70–99)
Glucose-Capillary: 114 mg/dL — ABNORMAL HIGH (ref 70–99)
Glucose-Capillary: 175 mg/dL — ABNORMAL HIGH (ref 70–99)
Glucose-Capillary: 237 mg/dL — ABNORMAL HIGH (ref 70–99)

## 2021-09-05 LAB — C-REACTIVE PROTEIN: CRP: 8.4 mg/dL — ABNORMAL HIGH (ref <1.0)

## 2021-09-05 LAB — D-DIMER, QUANTITATIVE: D-Dimer, Quant: 0.3 ug/mL-FEU (ref 0.00–0.50)

## 2021-09-05 MED ORDER — VANCOMYCIN HCL IN DEXTROSE 1-5 GM/200ML-% IV SOLN: 1000.0000 mg | INTRAVENOUS | 0 refills | Status: AC

## 2021-09-05 MED ORDER — OXYCODONE-ACETAMINOPHEN 7.5-325 MG PO TABS: 1.0000 | ORAL_TABLET | Freq: Three times a day (TID) | ORAL | 0 refills | Status: DC | PRN

## 2021-09-05 MED ORDER — HUMALOG KWIKPEN 100 UNIT/ML ~~LOC~~ SOPN
4.0000 [IU] | PEN_INJECTOR | Freq: Three times a day (TID) | SUBCUTANEOUS | 0 refills | Status: DC
Start: 2021-09-05 — End: 2022-06-29

## 2021-09-05 MED ORDER — FENTANYL 50 MCG/HR TD PT72: 1.0000 | MEDICATED_PATCH | TRANSDERMAL | 0 refills | Status: DC

## 2021-09-05 MED ORDER — METHYLPREDNISOLONE SODIUM SUCC 125 MG IJ SOLR
80.0000 mg | Freq: Every day | INTRAMUSCULAR | Status: DC
  Administered 2021-09-05 – 2021-09-06 (×2): 80 mg via INTRAVENOUS
  Filled 2021-09-05 (×2): qty 2

## 2021-09-05 MED ORDER — FAMOTIDINE 20 MG PO TABS: 20.0000 mg | ORAL_TABLET | Freq: Every day | ORAL | 0 refills | Status: DC

## 2021-09-05 NOTE — Discharge Summary (Addendum)
Physician Discharge Summary  Patient ID: [name] MRN: [id number] DOB/AGE: 67-Sep-1957 67 y.o.  Admit date: 09/02/2021 Discharge date: 09/05/2021  Admission Diagnoses:  Discharge Diagnoses:  Principal Problem:   Sepsis ([place]) Active Problems:   CAD (dz of distal, mid and proximal RCA with implantation of 3 overlapping drug-eluting stent,)   Insulin dependent type 2 diabetes mellitus ([place])   Acute osteomyelitis of left foot (HCC)   S/P BKA (below knee amputation), right (HCC)   S/P carotid endarterectomy   OSA on CPAP   Chronic back pain   Chronic obstructive pulmonary disease, unspecified ([place])   ESRD on hemodialysis ([place])   COVID   Discharged Condition: good  Hospital Course:  ALFARD COCHRANE is a 65 y.o. male with history of ESRD on hemodialysis, atrial fibrillation, diabetes mellitus type 2 while in the dialysis was found to be confused and febrile and having rigors was brought to the ER.  He had a temperature of 103, heart rate of 130, he was COVID-positive.  His lactic acid was 4.1 at the time admission.  He also has chronic left foot wound which was followed by orthopedics. He was not hypoxemic, he is placed on cefepime and vancomycin for sepsis    Severe sepsis. Thrombocytopenia secondary to sepsis. Left foot chronic wound with acute on chronic osteomyelitis. Acute metabolic encephalopathy. POA Patient met sepsis criteria at admission, but had lactic acid of 4.0.  Condition has improved.  Patient has been treated with vancomycin per ID consult. Condition has improved, patient be continued on vancomycin for 6 weeks, this will be administered after each dialysis.    COVID-19 pneumonia. Condition stable, medically stable to be discharged  Uncontrolled type 2 diabetes with hyperglycemia Resume home regimen, glucose not significant elevated, decreased short acting insulin.  Peripheral vascular disease status post revascularization. Coronary artery disease status  post stenting. Conditions are stable.  End-stage renal disease. Anemia of chronic kidney disease. Continue dialysis per nephrology  Obstructive sleep apnea. Continue CPAP   Paroxysmal atrial fibrillation. Continue Eliquis.   Consults: ID  Significant Diagnostic Studies:   MRI: 1. Wound/ulceration at the lateral aspect of the foot adjacent to the fourth TMT joint. 2. Findings of acute osteomyelitis involving the cuboid and adjacent fourth metatarsal base. 3. Additional findings suggest early acute osteomyelitis involving the peripheral aspect of the lateral cuneiform and third metatarsal base. 4. Multiple foci of susceptibility within the soft tissues along the lateral margin of the foot compatible with soft tissue air, which may be secondary to open wound or infection with gas-forming organism.     Electronically Signed   By: Davina Poke D.O.   On: 09/03/2021 10:25  Treatments: vancomycin  Discharge Exam: Blood pressure (!) 142/75, pulse 80, temperature 98.2 F (36.8 C), temperature source Oral, resp. rate 20, height 6' 2"  (1.88 m), weight 110.8 kg, SpO2 96 %. General appearance: alert and cooperative Resp: clear to auscultation bilaterally Cardio: regular rate and rhythm, S1, S2 normal, no murmur, click, rub or gallop GI: soft, non-tender; bowel sounds normal; no masses,  no organomegaly Extremities: extremities normal, atraumatic, no cyanosis or edema  Disposition: Discharge disposition: 01-Home or Self Care       Discharge Instructions     Diet - low sodium heart healthy   Complete by: As directed    Discharge wound care:   Complete by: As directed    Wash left foot wound with soap and water, pat dry. Place aquacel advantage over the wound, then dry  gauze, secure with kerlix. Change daily   Increase activity slowly   Complete by: As directed       Allergies as of 09/05/2021       Reactions   Contrast Media [iodinated Contrast Media]  Shortness Of Breath, Other (See Comments)   Difficulty breathing and altered mental status   Ivp Dye [iodinated Contrast Media] Anaphylaxis, Shortness Of Breath, Other (See Comments)   Breathing problems, altered mental state   Adhesive [tape] Rash, Other (See Comments)   Rash after 1 day of use   Latex Rash, Other (See Comments)   A severe rash appears after the first 24 hours of being placed        Medication List     STOP taking these medications    doxycycline 100 MG tablet Commonly known as: VIBRA-TABS       TAKE these medications    amLODipine 5 MG tablet Commonly known as: NORVASC Take 5 mg by mouth daily.   apixaban 5 MG Tabs tablet Commonly known as: ELIQUIS Take 5 mg by mouth See admin instructions. Take 5 mg by mouth two times a day only on Sun/Mon/Wed/Fri non-dialysis days   atorvastatin 40 MG tablet Commonly known as: LIPITOR Take 40 mg by mouth at bedtime.   famotidine 20 MG tablet Commonly known as: PEPCID Take 1 tablet (20 mg total) by mouth daily. What changed:  medication strength how much to take   fentaNYL 50 MCG/HR Commonly known as: [place] 1 patch onto the skin every 3 (three) days. What changed: how much to take   folic acid-vitamin b complex-vitamin c-selenium-zinc 3 MG Tabs tablet Take 1 tablet by mouth daily.   GLUCOSAMINE PO Take 1 tablet by mouth 2 (two) times daily.   HumaLOG KwikPen 100 UNIT/ML KwikPen Generic drug: insulin lispro Inject 4 Units into the skin 3 (three) times daily. What changed: how much to take   insulin glargine 100 UNIT/ML injection Commonly known as: LANTUS Inject 0.1 mLs (10 Units total) into the skin at bedtime.   ketoconazole 2 % cream Commonly known as: NIZORAL Apply 1 application topically 2 (two) times daily as needed for irritation.   lidocaine-prilocaine cream Commonly known as: EMLA Apply 1 application topically See admin instructions. Prior to Dialysis days Tuesday,Thursday and  saturday   linaclotide 145 MCG Caps capsule Commonly known as: LINZESS Take 145 mcg by mouth daily as needed (constipation).   loratadine 10 MG tablet Commonly known as: CLARITIN Take 10 mg by mouth daily.   NEEDLE (REUSABLE) 22 G 22G X 1-1/2" Misc 1 Units by Does not apply route as directed. For B12 IM inj   nitroGLYCERIN 0.2 mg/hr patch Commonly known as: NITRODUR - Dosed in mg/24 hr Place 1 patch (0.2 mg total) onto the skin daily.   omega-3 acid ethyl esters 1 g capsule Commonly known as: LOVAZA Take 2 g by mouth 2 (two) times daily.   ondansetron 4 MG tablet Commonly known as: ZOFRAN Take 4 mg by mouth every 6 (six) hours as needed for nausea or vomiting.   oxyCODONE-acetaminophen 7.5-325 MG tablet Commonly known as: PERCOCET Take 1 tablet by mouth 3 (three) times daily as needed for moderate pain.   pantoprazole 40 MG tablet Commonly known as: PROTONIX Take 40 mg by mouth daily.   Polyvinyl Alcohol-Povidone PF 1.4-0.6 % Soln Place 1 drop into both eyes 2 (two) times daily as needed (dry eyes).   pregabalin 75 MG capsule Commonly known as: LYRICA Take 1  capsule (75 mg total) by mouth daily for 14 days. What changed: when to take this   rOPINIRole 2 MG tablet Commonly known as: REQUIP Take 2 mg by mouth at bedtime.   SYRINGE 3CC/22GX1-1/2" 22G X 1-1/2" 3 ML Misc 1 Syringe by Does not apply route as directed. For b12 IM inj   tamsulosin 0.4 MG Caps capsule Commonly known as: FLOMAX Take 0.4 mg by mouth daily.   vancomycin 1-5 GM/200ML-% Soln Commonly known as: VANCOCIN Inject 200 mLs (1,000 mg total) into the vein Every Tuesday,Thursday,and Saturday with dialysis. Start taking on: September 07, 2021               Discharge Care Instructions  (From admission, onward)           Start     Ordered   09/05/21 0000  Discharge wound care:       Comments: Wash left foot wound with soap and water, pat dry. Place aquacel advantage over the wound, then  dry gauze, secure with kerlix. Change daily   09/05/21 1028            Follow-up Information     [name], PA-C Follow up in 1 week(s).   Specialty: Physician Assistant Contact information: [place] [address] [phone]         Newt Minion, MD Follow up in 2 week(s).   Specialty: Orthopedic Surgery Contact information: [address] [place] [address] [phone]                32 minutes Signed: Sharen Hones 09/05/2021, 10:28 AM  I have adended this discharge summary to reflect new medication reconcillation. Progress note today for changes .

## 2021-09-05 NOTE — Progress Notes (Signed)
[facility] Progress Note  Subjective: seen in room, no new c/o, just had HD this am  Vitals:   09/05/21 1030 09/05/21 1100 09/05/21 1128 09/05/21 1200  BP: (!) 153/70 121/62 (!) 165/61 (!) 160/49  Pulse: 78 89 70 83  Resp:   18 19  Temp:   98 F (36.7 C) 98.2 F (36.8 C)  TempSrc:   Oral Oral  SpO2:   100% 100%  Weight:   107.5 kg   Height:        Exam:  alert, nad   no jvd  Chest faint R basilar rales, L clear  Cor reg no RG  Abd soft ntnd no ascites   Ext no LE edema   Alert, NF, ox3    L AVF+b/t     [facility] HD[facility]  4h 44min  450/ 800  108 kg 2/2 bath  L AVF  Heparin none   - mircera 50 q2 last 12/13, due 12/27   - venofer 50 per week   Assessment/ Plan: ESRD - HD per TTS schedule - next tx on 12/27.  AMS - resolved, back to baseline Covid positive - per pmd, on remdesivir Hypertension -  improved control  Atrial fibrillation - Per primary team  Anemia CKD - ordered aranesp 60 mcg weekly to start 44/81 Metabolic bone disease - not on activated vit d outpt, phos 6.3, Ca ok Dispo - possible dc today   Rob Karstyn Birkey 09/05/2021, 1:00 PM  Recent Labs  Lab 09/04/21 0351 09/05/21 0151  K 3.9 3.4*  BUN 32* 43*  CREATININE 5.24* 5.74*  CALCIUM 8.0* 7.9*  PHOS 6.3*  --   HGB 10.4* 10.2*    Inpatient medications:  apixaban  5 mg Oral BID   atorvastatin  40 mg Oral QHS   darbepoetin (ARANESP) injection - DIALYSIS  60 mcg Intravenous Q Tue-HD   famotidine  20 mg Oral Daily   fentaNYL  1 patch Transdermal Q72H   insulin aspart  0-6 Units Subcutaneous TID WC   insulin glargine-yfgn  10 Units Subcutaneous QHS   loratadine  10 mg Oral Daily   omega-3 acid ethyl esters  2 g Oral BID   pantoprazole  40 mg Oral Daily   pregabalin  75 mg Oral QHS   rOPINIRole  2 mg Oral QHS   tamsulosin  0.4 mg Oral Daily   traZODone  50 mg Oral Once    vancomycin 1,000 mg (09/05/21 1013)   acetaminophen, hydrALAZINE, linaclotide, ondansetron (ZOFRAN) IV,  oxyCODONE-acetaminophen

## 2021-09-05 NOTE — Progress Notes (Addendum)
Patient has more shortness after arriving to the floor after HD. On 2 L of oxygen. I will cancel discharge, start solu-medrol.

## 2021-09-06 ENCOUNTER — Ambulatory Visit: Payer: 59 | Admitting: Family

## 2021-09-06 ENCOUNTER — Ambulatory Visit: Payer: 59 | Admitting: Cardiology

## 2021-09-06 DIAGNOSIS — A419 Sepsis, unspecified organism: Secondary | ICD-10-CM | POA: Diagnosis not present

## 2021-09-06 LAB — CBC WITH DIFFERENTIAL/PLATELET
Abs Immature Granulocytes: 0.01 10*3/uL (ref 0.00–0.07)
Basophils Absolute: 0 10*3/uL (ref 0.0–0.1)
Basophils Relative: 0 %
Eosinophils Absolute: 0 10*3/uL (ref 0.0–0.5)
Eosinophils Relative: 0 %
HCT: 34.9 % — ABNORMAL LOW (ref 39.0–52.0)
Hemoglobin: 11.9 g/dL — ABNORMAL LOW (ref 13.0–17.0)
Immature Granulocytes: 0 %
Lymphocytes Relative: 15 %
Lymphs Abs: 0.3 10*3/uL — ABNORMAL LOW (ref 0.7–4.0)
MCH: 30.1 pg (ref 26.0–34.0)
MCHC: 34.1 g/dL (ref 30.0–36.0)
MCV: 88.1 fL (ref 80.0–100.0)
Monocytes Absolute: 0.1 10*3/uL (ref 0.1–1.0)
Monocytes Relative: 3 %
Neutro Abs: 1.9 10*3/uL (ref 1.7–7.7)
Neutrophils Relative %: 82 %
Platelets: 117 10*3/uL — ABNORMAL LOW (ref 150–400)
RBC: 3.96 MIL/uL — ABNORMAL LOW (ref 4.22–5.81)
RDW: 14.2 % (ref 11.5–15.5)
WBC: 2.3 10*3/uL — ABNORMAL LOW (ref 4.0–10.5)
nRBC: 0 % (ref 0.0–0.2)

## 2021-09-06 LAB — COMPREHENSIVE METABOLIC PANEL
ALT: 13 U/L (ref 0–44)
AST: 18 U/L (ref 15–41)
Albumin: 3.1 g/dL — ABNORMAL LOW (ref 3.5–5.0)
Alkaline Phosphatase: 98 U/L (ref 38–126)
Anion gap: 14 (ref 5–15)
BUN: 30 mg/dL — ABNORMAL HIGH (ref 8–23)
CO2: 23 mmol/L (ref 22–32)
Calcium: 8.4 mg/dL — ABNORMAL LOW (ref 8.9–10.3)
Chloride: 96 mmol/L — ABNORMAL LOW (ref 98–111)
Creatinine, Ser: 4.85 mg/dL — ABNORMAL HIGH (ref 0.61–1.24)
GFR, Estimated: 12 mL/min — ABNORMAL LOW (ref >60)
Glucose, Bld: 417 mg/dL — ABNORMAL HIGH (ref 70–99)
Potassium: 4 mmol/L (ref 3.5–5.1)
Sodium: 133 mmol/L — ABNORMAL LOW (ref 135–145)
Total Bilirubin: 0.6 mg/dL (ref 0.3–1.2)
Total Protein: 5.9 g/dL — ABNORMAL LOW (ref 6.5–8.1)

## 2021-09-06 LAB — GLUCOSE, CAPILLARY
Glucose-Capillary: 328 mg/dL — ABNORMAL HIGH (ref 70–99)
Glucose-Capillary: 235 mg/dL — ABNORMAL HIGH (ref 70–99)
Glucose-Capillary: 369 mg/dL — ABNORMAL HIGH (ref 70–99)
Glucose-Capillary: 542 mg/dL (ref 70–99)

## 2021-09-06 LAB — TROPONIN I (HIGH SENSITIVITY)
Troponin I (High Sensitivity): 20 ng/L — ABNORMAL HIGH (ref <18)
Troponin I (High Sensitivity): 21 ng/L — ABNORMAL HIGH (ref <18)

## 2021-09-06 LAB — HEPATITIS B SURFACE ANTIBODY, QUANTITATIVE: Hep B S AB Quant (Post): >1000 m[IU]/mL (ref >9.9)

## 2021-09-06 LAB — D-DIMER, QUANTITATIVE: D-Dimer, Quant: <0.27 ug/mL-FEU (ref 0.00–0.50)

## 2021-09-06 LAB — C-REACTIVE PROTEIN: CRP: 6.4 mg/dL — ABNORMAL HIGH (ref <1.0)

## 2021-09-06 MED ORDER — NITROGLYCERIN 2 % TD OINT
1.0000 [in_us] | TOPICAL_OINTMENT | Freq: Four times a day (QID) | TRANSDERMAL | Status: DC
  Administered 2021-09-06 – 2021-09-07 (×6): 1 [in_us] via TOPICAL
  Filled 2021-09-06 (×2): qty 30

## 2021-09-06 MED ORDER — HYDROMORPHONE HCL 1 MG/ML IJ SOLN
0.5000 mg | Freq: Once | INTRAMUSCULAR | Status: AC
  Administered 2021-09-06: 04:00:00 0.5 mg via INTRAVENOUS
  Filled 2021-09-06: qty 1

## 2021-09-06 MED ORDER — AMIODARONE HCL 200 MG PO TABS
200.0000 mg | ORAL_TABLET | Freq: Two times a day (BID) | ORAL | Status: DC
  Administered 2021-09-06 – 2021-09-07 (×3): 200 mg via ORAL
  Filled 2021-09-06 (×3): qty 1

## 2021-09-06 MED ORDER — INSULIN ASPART 100 UNIT/ML IJ SOLN: 0.0000 [IU] | Freq: Three times a day (TID) | INTRAMUSCULAR | Status: DC

## 2021-09-06 MED ORDER — AMIODARONE HCL 200 MG PO TABS: 200.0000 mg | ORAL_TABLET | Freq: Every day | ORAL | 1 refills | Status: DC

## 2021-09-06 MED ORDER — NITROGLYCERIN 0.4 MG SL SUBL
SUBLINGUAL_TABLET | SUBLINGUAL | Status: AC
  Administered 2021-09-06: 04:00:00 0.4 mg
  Filled 2021-09-06: qty 1

## 2021-09-06 MED ORDER — MIDODRINE HCL 5 MG PO TABS
10.0000 mg | ORAL_TABLET | ORAL | Status: DC
  Filled 2021-09-06: qty 2

## 2021-09-06 MED ORDER — PREGABALIN 100 MG PO CAPS: 100.0000 mg | ORAL_CAPSULE | Freq: Two times a day (BID) | ORAL | Status: DC

## 2021-09-06 MED ORDER — APIXABAN 5 MG PO TABS: 5.0000 mg | ORAL_TABLET | Freq: Two times a day (BID) | ORAL | Status: DC

## 2021-09-06 MED ORDER — INSULIN ASPART 100 UNIT/ML IJ SOLN
3.0000 [IU] | Freq: Once | INTRAMUSCULAR | Status: AC
  Administered 2021-09-06: 06:00:00 3 [IU] via SUBCUTANEOUS

## 2021-09-06 MED ORDER — INSULIN ASPART 100 UNIT/ML IJ SOLN
0.0000 [IU] | Freq: Three times a day (TID) | INTRAMUSCULAR | Status: DC
  Administered 2021-09-06: 6 [IU] via SUBCUTANEOUS
  Administered 2021-09-07: 13:00:00 3 [IU] via SUBCUTANEOUS
  Administered 2021-09-07: 09:00:00 4 [IU] via SUBCUTANEOUS

## 2021-09-06 MED ORDER — MIDODRINE HCL 10 MG PO TABS: 10.0000 mg | ORAL_TABLET | ORAL | 3 refills | Status: DC

## 2021-09-06 MED ORDER — PREGABALIN 75 MG PO CAPS
75.0000 mg | ORAL_CAPSULE | Freq: Two times a day (BID) | ORAL | Status: DC
  Administered 2021-09-06 – 2021-09-07 (×3): 75 mg via ORAL
  Filled 2021-09-06 (×3): qty 1

## 2021-09-06 NOTE — Progress Notes (Signed)
Inpatient Diabetes Program Recommendations  AACE/ADA: New Consensus Statement on Inpatient Glycemic Control (2015)  Target Ranges:  Prepandial:   less than 140 mg/dL      Peak postprandial:   less than 180 mg/dL (1-2 hours)      Critically ill patients:  140 - 180 mg/dL   Lab Results  Component Value Date   GLUCAP 328 (H) 09/06/2021   HGBA1C 7.9 (H) 06/14/2021    Review of Glycemic Control  Latest Reference Range & Units 09/05/21 16:25 09/05/21 20:24 09/06/21 05:30  Glucose-Capillary 70 - 99 mg/dL 175 (H) 237 (H) 328 (H)  (H): Data is abnormally high Diabetes history: Type 2 DM Outpatient Diabetes medications: none Current orders for Inpatient glycemic control: Semglee 10 units QHS, Novolog 0-6 units TID Solumedrol 125 mg x 1  Inpatient Diabetes Program Recommendations:    In the setting of steroids, consider adding Novolog 4 units TID (assuming patient is consuming >50% of meals).   Thanks, Bronson Curb, MSN, RNC-OB Diabetes Coordinator [phone] (8a-5p)

## 2021-09-06 NOTE — Progress Notes (Signed)
Dr. Fabio Neighbors called and made aware patient still having C.P. 5/10 V.S.S. no orders given at this time.

## 2021-09-06 NOTE — Significant Event (Signed)
TRH floor coverage.  Pt having CP overnight.  3x SL NTG reduced intensity from 10/10 to 6/10.  Starting NTG paste on chest as BP still 549 systolic.  Giving 0.5mg  dilaudid.  EKG grossly unchanged from prior (no longer in A.Fib at the moment)  Serial trops ordered.

## 2021-09-06 NOTE — Progress Notes (Signed)
Patient called c/o Chest pain mid ant. to right chest Skin warm and dry. Resp. Even and unlab. Patient did state he was a little S.O.B increase O2  to 4 liters. 9/10 sharp chest pain. Gave 3 NTG S.L. one q 5 mins. See vital sign flow sheet. Butch Penny R.N. aware . E.K.G. done . B.P. greater than 160 gave Hydralazine for B.P. control S.R. to S.T. 90-106. Dr. Fabio Neighbors called and made aware of the above see new orders. Dilaudid 0.5 mg given for Chest pain per M.D.orders. patient very talkative and calm.

## 2021-09-06 NOTE — Progress Notes (Signed)
[facility] 328 Dr. Alcario Drought text paged results

## 2021-09-06 NOTE — Progress Notes (Signed)
[facility] Progress Note  Subjective: Patient not seen directly today given COVID-19 + status, utilizing data taken from chart +/- discussions w/ providers and staff.      Vitals:   09/06/21 0510 09/06/21 0513 09/06/21 0847 09/06/21 1127  BP: (!) 142/86  121/65 (!) 156/58  Pulse: 96  98 95  Resp: 20  20 17   Temp:   98.9 F (37.2 C) 98.9 F (37.2 C)  TempSrc:   Oral Oral  SpO2: 98%  97% 98%  Weight:  100.7 kg    Height:        Exam: Patient not seen directly today given COVID-19 + status, utilizing data taken from chart +/- discussions w/ providers and staff.     CXR 12/24, 12/27 - no sig edema on either    [facility]  4h 43min  450/ 800  108 kg 2/2 bath  L AVF  Heparin none   - mircera 50 q2 last 12/13, due 12/27   - venofer 50 per week   Assessment/ Plan: Covid positive - per pmd, on remdesivir AMS - resolved, back to baseline Hypoxia - requiring nasal O2. Not in distress. CXR x 2 here w/o edema. Possibly hypoxia d/t COVID infection. Is under dry wt. Tolerated 3 L off w/ HD yesterday. UF goal 2-3 L next HD tomorrow.  ESRD - HD TTS. Had tomorrow.  Hypertension -  improved control  Atrial fibrillation - Per primary team  Anemia CKD - Hb 10-10.5, ordered aranesp 60 mcg weekly, last given 50/93 Metabolic bone disease - not on activated vit d outpt, phos 6.3, Ca ok   [name] 09/06/2021, 1:17 PM  Recent Labs  Lab 09/04/21 0351 09/05/21 0151 09/06/21 0126  K 3.9 3.4* 4.0  BUN 32* 43* 30*  CREATININE 5.24* 5.74* 4.85*  CALCIUM 8.0* 7.9* 8.4*  PHOS 6.3*  --   --   HGB 10.4* 10.2* 11.9*    Inpatient medications:  apixaban  5 mg Oral BID   atorvastatin  40 mg Oral QHS   darbepoetin (ARANESP) injection - DIALYSIS  60 mcg Intravenous Q Tue-HD   famotidine  20 mg Oral Daily   fentaNYL  1 patch Transdermal Q72H   insulin aspart  0-6 Units Subcutaneous TID WC   insulin glargine-yfgn  10 Units Subcutaneous QHS   loratadine  10 mg Oral  Daily   methylPREDNISolone (SOLU-MEDROL) injection  80 mg Intravenous Daily   nitroGLYCERIN  1 inch Topical Q6H   omega-3 acid ethyl esters  2 g Oral BID   pantoprazole  40 mg Oral Daily   pregabalin  75 mg Oral QHS   rOPINIRole  2 mg Oral QHS   tamsulosin  0.4 mg Oral Daily   traZODone  50 mg Oral Once    vancomycin 1,000 mg (09/05/21 1013)   acetaminophen, hydrALAZINE, linaclotide, ondansetron (ZOFRAN) IV, oxyCODONE-acetaminophen

## 2021-09-06 NOTE — Progress Notes (Signed)
Pt receives out-pt HD at [facility] on TTS. Pt arrives at 12:10 for 12:25 chair time. Spoke to [place] at clinic to make clinic aware pt is covid positive. Pt's schedule will remain the same at d/c. Will assist as needed.  Melven Sartorius Renal Navigator [phone]

## 2021-09-06 NOTE — Progress Notes (Signed)
PROGRESS NOTE    [name]  CNO:[id number] DOB: 03-24-1956 DOA: 09/02/2021 PCP: [name], PA-C    Brief Narrative:  65 year old gentleman with ESRD on hemodialysis, chronic A. fib, type 2 diabetes was found confused and febrile while getting hemodialysis and brought to ER.  In the emergency room temperature 103, heart rate 130, COVID test positive, lactic acid 4.1.  Found to have left foot chronic wound now infected with evidence of osteomyelitis.   Assessment & Plan:   Principal Problem:   Sepsis ([place]) Active Problems:   CAD (dz of distal, mid and proximal RCA with implantation of 3 overlapping drug-eluting stent,)   Insulin dependent type 2 diabetes mellitus ([place])   Acute osteomyelitis of left foot (H[facility])   S/P BKA (below knee amputation), right (H[facility])   S/P carotid endarterectomy   OSA on CPAP   Chronic back pain   Chronic obstructive pulmonary disease, unspecified ([place])   ESRD on hemodialysis ([place])   COVID   Acute metabolic encephalopathy  Severe sepsis present on admission, resolved.  Thrombocytopenia secondary to sepsis.  Left foot chronic wound with acute on chronic osteomyelitis: Clinically improving.  Seen by orthopedics and ID.  Recommended 6 weeks of vancomycin with hemodialysis.  Nephrology to arrange vancomycin infusion after each dialysis.  Pneumonia due to COVID-19: Received 3 days of remdesivir.  Was treated with high-dose steroids.  Wean off oxygen.  Inflammatory markers are within normal limits.  Discontinue steroids.  Paroxysmal A. fib: Overnight tachycardic, intermittent RVR symptomatic with chest pain.  Not on any rate control medicine. Therapeutic on Eliquis.  Coronary artery disease status post stenting: Fairly stable.  With chest pain last night, troponins were drawn and they were normal.  This was mostly related to A. fib.  Improved with nitro patch and a dose of pain medication.  ESRD on hemodialysis: TTS schedule.  Next dialysis  tomorrow.  Uncontrolled type 2 diabetes with hyperglycemia: Aggravated by use of steroids.  Patient is on very high-dose of steroids with no apparent benefit.  Will discontinue.  After the sleep apnea: Using CPAP at night.   DVT prophylaxis:  apixaban (ELIQUIS) tablet 5 mg   Code Status: Full code Family Communication: None Disposition Plan: Status is: Inpatient  Remains inpatient appropriate because: Difficult to control heart rate.         Consultants:  Cardiology  Procedures:  Routine hemodialysis  Antimicrobials:  Vancomycin with dialysis   Subjective: Patient seen and examined in the morning rounds.  She was wearing 2 L oxygen and saturating 100%.  Overnight events noted.  He had episodes of palpitations and he felt like he will die.  Telemetry shows A. fib with occasional heart rate 140.  At the time of my evaluation he was feeling better.  Denied any chest pain.  He was wearing oxygen that makes him comfortable.  He was ultimately weaned off oxygen.  I called and discussed case with his cardiologist, [name].  Objective: Vitals:   09/06/21 0510 09/06/21 0513 09/06/21 0847 09/06/21 1127  BP: (!) 142/86  121/65 (!) 156/58  Pulse: 96  98 95  Resp: 20  20 17   Temp:   98.9 F (37.2 C) 98.9 F (37.2 C)  TempSrc:   Oral Oral  SpO2: 98%  97% 98%  Weight:  100.7 kg    Height:        Intake/Output Summary (Last 24 hours) at 09/06/2021 1444 Last data filed at 09/06/2021 0004 Gross per 24 hour  Intake  737 ml  Output 575 ml  Net 162 ml   Filed Weights   09/05/21 0745 09/05/21 1128 09/06/21 0513  Weight: 110.8 kg 107.5 kg 100.7 kg    Examination:  General exam: Appears calm and comfortable  Respiratory system: Bilateral clear. Cardiovascular system: S1 & S2 heard, irregularly irregular.  Tachycardic.   Gastrointestinal system: Soft.  Nondistended.  Bowel sound present. Central nervous system: Alert and oriented. No focal neurological  deficits. Extremities:  Patient has right below-knee amputation with prosthesis fitted. Patient has left lateral condyle wound, chronic ischemic changes.    Data Reviewed: I have personally reviewed following labs and imaging studies  CBC: Recent Labs  Lab 09/02/21 1700 09/02/21 1718 09/03/21 0122 09/04/21 0351 09/05/21 0151 09/06/21 0126  WBC 7.1  --  5.4 4.2 3.8* 2.3*  NEUTROABS 6.0  --  4.2 2.6 2.1 1.9  HGB 11.9* 12.2* 9.2* 10.4* 10.2* 11.9*  HCT 35.1* 36.0* 28.0* 30.7* 30.1* 34.9*  MCV 90.7  --  92.1 91.6 89.9 88.1  PLT 108*  --  90* 93* 103* 349*   Basic Metabolic Panel: Recent Labs  Lab 09/02/21 1700 09/02/21 1718 09/03/21 0122 09/04/21 0351 09/05/21 0151 09/06/21 0126  NA 139 137 137 137 135 133*  K 3.8 3.8 3.7 3.9 3.4* 4.0  CL 96* 94* 98 99 100 96*  CO2 29  --  30 25 21* 23  GLUCOSE 265* 262* 228* 122* 129* 417*  BUN 13 14 17  32* 43* 30*  CREATININE 3.24* 3.10* 4.01* 5.24* 5.74* 4.85*  CALCIUM 8.2*  --  7.5* 8.0* 7.9* 8.4*  PHOS  --   --   --  6.3*  --   --    GFR: Estimated Creatinine Clearance: 18.7 mL/min (A) (by C-G formula based on SCr of 4.85 mg/dL (H)). Liver Function Tests: Recent Labs  Lab 09/02/21 1700 09/03/21 0122 09/04/21 0351 09/05/21 0151 09/06/21 0126  AST 20 15 17 17 18   ALT 11 11 11 9 13   ALKPHOS 129* 93 84 89 98  BILITOT 0.6 0.5 0.4 0.8 0.6  PROT 6.3* 5.1* 5.3* 5.1* 5.9*  ALBUMIN 3.3* 2.6* 2.6* 2.7* 3.1*   No results for input(s): LIPASE, AMYLASE in the last 168 hours. No results for input(s): AMMONIA in the last 168 hours. Coagulation Profile: No results for input(s): INR, PROTIME in the last 168 hours. Cardiac Enzymes: No results for input(s): CKTOTAL, CKMB, CKMBINDEX, TROPONINI in the last 168 hours. BNP (last 3 results) No results for input(s): PROBNP in the last 8760 hours. HbA1C: No results for input(s): HGBA1C in the last 72 hours. CBG: Recent Labs  Lab 09/05/21 1202 09/05/21 1625 09/05/21 2024  09/06/21 0530 09/06/21 1124  GLUCAP 114* 175* 237* 328* 235*   Lipid Profile: No results for input(s): CHOL, HDL, LDLCALC, TRIG, CHOLHDL, LDLDIRECT in the last 72 hours. Thyroid Function Tests: No results for input(s): TSH, T4TOTAL, FREET4, T3FREE, THYROIDAB in the last 72 hours. Anemia Panel: No results for input(s): VITAMINB12, FOLATE, FERRITIN, TIBC, IRON, RETICCTPCT in the last 72 hours. Sepsis Labs: Recent Labs  Lab 09/02/21 1700 09/03/21 0122 09/03/21 0335  PROCALCITON  --  1.32  --   LATICACIDVEN 4.1* 2.3* 1.5    Recent Results (from the past 240 hour(s))  Blood culture (routine x 2)     Status: None (Preliminary result)   Collection Time: 09/02/21  5:00 PM   Specimen: BLOOD  Result Value Ref Range Status   Specimen Description BLOOD RIGHT ANTECUBITAL  Final  Special Requests   Final    BOTTLES DRAWN AEROBIC AND ANAEROBIC Blood Culture results may not be optimal due to an excessive volume of blood received in culture bottles   Culture   Final    NO GROWTH 4 DAYS Performed at [facility], [place] [address], [place], [place] [address]    Report Status PENDING  Incomplete  Resp Panel by RT-PCR (Flu A&B, Covid) Nasopharyngeal Swab     Status: Abnormal   Collection Time: 09/02/21  5:10 PM   Specimen: Nasopharyngeal Swab; Nasopharyngeal(NP) swabs in vial transport medium  Result Value Ref Range Status   SARS Coronavirus 2 by RT PCR POSITIVE (A) NEGATIVE Final    Comment: (NOTE) SARS-CoV-2 target nucleic acids are DETECTED.  The SARS-CoV-2 RNA is generally detectable in upper respiratory specimens during the acute phase of infection. Positive results are indicative of the presence of the identified virus, but do not rule out bacterial infection or co-infection with other pathogens not detected by the test. Clinical correlation with patient history and other diagnostic information is necessary to determine patient infection status. The expected result is  Negative.  Fact Sheet for Patients: EntrepreneurPulse.com.au  Fact Sheet for Healthcare Providers: IncredibleEmployment.be  This test is not yet approved or cleared by the [place] FDA and  has been authorized for detection and/or diagnosis of SARS-CoV-2 by FDA under an Emergency Use Authorization (EUA).  This EUA will remain in effect (meaning this test can be used) for the duration of  the COVID-19 declaration under Section 564(b)(1) of the A ct, 21 U.S.C. section 360bbb-3(b)(1), unless the authorization is terminated or revoked sooner.     Influenza A by PCR NEGATIVE NEGATIVE Final   Influenza B by PCR NEGATIVE NEGATIVE Final    Comment: (NOTE) The Xpert Xpress SARS-CoV-2/FLU/RSV plus assay is intended as an aid in the diagnosis of influenza from Nasopharyngeal swab specimens and should not be used as a sole basis for treatment. Nasal washings and aspirates are unacceptable for Xpert Xpress SARS-CoV-2/FLU/RSV testing.  Fact Sheet for Patients: EntrepreneurPulse.com.au  Fact Sheet for Healthcare Providers: IncredibleEmployment.be  This test is not yet approved or cleared by the [place] FDA and has been authorized for detection and/or diagnosis of SARS-CoV-2 by FDA under an Emergency Use Authorization (EUA). This EUA will remain in effect (meaning this test can be used) for the duration of the COVID-19 declaration under Section 564(b)(1) of the Act, 21 U.S.C. section 360bbb-3(b)(1), unless the authorization is terminated or revoked.  Performed at [facility], [place] [address], [place], [place] [address]   Blood culture (routine x 2)     Status: None (Preliminary result)   Collection Time: 09/02/21  5:38 PM   Specimen: BLOOD  Result Value Ref Range Status   Specimen Description BLOOD SITE NOT SPECIFIED  Final   Special Requests   Final    BOTTLES DRAWN AEROBIC AND ANAEROBIC Blood  Culture adequate volume   Culture   Final    NO GROWTH 4 DAYS Performed at [facility], [address] [place], [place] [address]    Report Status PENDING  Incomplete         Radiology Studies: DG CHEST PORT 1 VIEW  Result Date: 09/05/2021 CLINICAL DATA:  Hypoxia EXAM: PORTABLE CHEST 1 VIEW COMPARISON:  09/02/2021 FINDINGS: Transverse diameter of heart is slightly increased. There are no signs of alveolar pulmonary edema or new focal pulmonary consolidation. Left hemidiaphragm is elevated. There is crowding  of markings in the left lower lung fields. There is improvement in aeration of right lower lung fields. There is no pleural effusion or pneumothorax. IMPRESSION: Increased markings in the left lower lung fields may be due to crowding of normal bronchovascular structures caused by elevation of left hemidiaphragm or subsegmental atelectasis. There is no focal pulmonary consolidation. There is no pleural effusion or pneumothorax. Electronically Signed   By: Elmer Picker M.D.   On: 09/05/2021 14:00        Scheduled Meds:  apixaban  5 mg Oral BID   atorvastatin  40 mg Oral QHS   darbepoetin (ARANESP) injection - DIALYSIS  60 mcg Intravenous Q Tue-HD   famotidine  20 mg Oral Daily   fentaNYL  1 patch Transdermal Q72H   insulin aspart  0-6 Units Subcutaneous TID WC   insulin glargine-yfgn  10 Units Subcutaneous QHS   loratadine  10 mg Oral Daily   nitroGLYCERIN  1 inch Topical Q6H   omega-3 acid ethyl esters  2 g Oral BID   pantoprazole  40 mg Oral Daily   pregabalin  75 mg Oral QHS   rOPINIRole  2 mg Oral QHS   tamsulosin  0.4 mg Oral Daily   traZODone  50 mg Oral Once   Continuous Infusions:  vancomycin 1,000 mg (09/05/21 1013)     LOS: 4 days    Time spent: 30 minutes    Barb Merino, MD [facility] Pager [phone]

## 2021-09-06 NOTE — Consult Note (Addendum)
CARDIOLOGY CONSULT NOTE  Patient ID: [name] MRN: [id number] DOB/AGE: 1956-05-21 65 y.o.  Admit date: 09/02/2021 Referring Physician  Niel Hummer, MD Primary Physician:  [name], PA-C Reason for Consultation  PAD, A. Fib  Patient ID: [name], male    DOB: 11-20-1955, 65 y.o.   MRN: [id number]  Chief Complaint  Patient presents with   Altered Mental Status   HPI:    [name]  is a 65 y.o. Caucasian male patient with severe peripheral arterial disease and has history of right below-knee amputation and toe lamputations on the left, multiple peripheral interventions in the past, uncontrolled diabetes mellitus, ESRD, severe diabetic retinopathy, coronary artery disease angioplasty to the right coronary artery on 10/29/2014, right carotid endarterectomy in 2012 , OSA Unable to tolerate CPAP, chronic back pain and neck and back surgery in past and on chronic pain medications, persistent atrial fibrillation on Eliquis, chronic left foot ulceration from severe PAD and DM admitted to the hospital on 09/02/2021 with community-acquired pneumonia, Continued tobacco use disorder with vape use admitted to the hospital on 09/02/2021 with  altered mental status, sepsis and hypoxemic respiratory failure.  He was also found to have COVID-19 pneumonia as well.  Since being on antibiotics symptoms improved, however he had an episode of syncope after dialysis today and also due to A. fib with RVR I was consulted for management.  Patient is presently feeling well and denies chest pain or palpitations.  He has cough since he developed pneumonia but still states that his dry cough.  He also has mild dyspnea on exertion.  His activity is markedly limited over the last several months and is essentially wheelchair-bound due to PAD, left leg ulcer and weakness in his left lower extremity and arthritis and generalized deconditioning.   Past Medical History:  Diagnosis Date   Carotid artery  occlusion 11/10/10   LEFT CAROTID ENDARTERECTOMY   Chronic kidney disease    Complication of anesthesia    BP WENT UP AT [facility] "   COPD (chronic obstructive pulmonary disease) ([place])    pt denies this dx as of 06/01/20 - no inhaler    Diabetes mellitus without complication ([place])    Diverticulitis    Diverticulosis of colon (without mention of hemorrhage)    DJD (degenerative joint disease)    knees/hands/feet/back/neck   Fatty liver    Full dentures    GERD (gastroesophageal reflux disease)    H/O hiatal hernia    History of blood transfusion    with a past surical procedure per patient 06/01/20   Hyperlipidemia    Hypertension    Neuromuscular disorder ([place])    peripheral neuropathy   Non-pressure chronic ulcer of other part of left foot limited to breakdown of skin ([place]) 11/12/2016   Osteomyelitis ([facility])    left 5th metatarsal   PAD (peripheral artery disease) ([place])    Distal aortogram June 2012. Atherectomy left popliteal artery July 2012.    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   Unstable angina ([place]) 09/16/2018   Wears glasses    Past Surgical History:  Procedure Laterality Date   ABDOMINAL AORTOGRAM W/LOWER EXTREMITY N/A 06/23/2021   Procedure: ABDOMINAL AORTOGRAM W/LOWER EXTREMITY;  Surgeon: Nigel Mormon, MD;  Location: [place];  Service: Cardiovascular;  Laterality: N/A;  AMPUTATION  11/05/2011   Procedure: AMPUTATION RAY;  Surgeon: Wylene Simmer, MD;  Location: [facility];  Service: Orthopedics;  Laterality: Right;  Amputation of Right 4&5th Toes   AMPUTATION Left 11/26/2012   Procedure: AMPUTATION RAY;  Surgeon: Wylene Simmer, MD;  Location: [facility];  Service: Orthopedics;  Laterality: Left;  fourth ray amputation   AMPUTATION Right 08/27/2014   Procedure: Transmetatarsal Amputation;  Surgeon: Newt Minion, MD;  Location: [place];  Service:  Orthopedics;  Laterality: Right;   AMPUTATION Right 01/14/2015   Procedure: AMPUTATION BELOW KNEE;  Surgeon: Newt Minion, MD;  Location: [place];  Service: Orthopedics;  Laterality: Right;   AMPUTATION Left 10/21/2015   Procedure: Left Foot 5th Ray Amputation;  Surgeon: Newt Minion, MD;  Location: [place];  Service: Orthopedics;  Laterality: Left;   ANTERIOR FUSION CERVICAL SPINE  02/06/06   C4-5, C5-6, C6-7; SURGEON DR. MAX COHEN   AV FISTULA PLACEMENT Left 06/02/2020   Procedure: ARTERIOVENOUS (AV) FISTULA CREATION LEFT;  Surgeon: Waynetta Sandy, MD;  Location: [place];  Service: Vascular;  Laterality: Left;   BACK SURGERY     x 3   [place] Left 07/21/2020   Procedure: LEFT UPPER ARM ATERIOVENOUS SUPERFISTULALIZATION;  Surgeon: Waynetta Sandy, MD;  Location: [place];  Service: Vascular;  Laterality: Left;   BELOW KNEE LEG AMPUTATION Right    CARDIAC CATHETERIZATION  10/31/04   2009   CAROTID ENDARTERECTOMY  11/10/10   CAROTID ENDARTERECTOMY Left 11/10/2010   Subtotal occlusion of left internal carotid artery with left hemispheric transient ischemic attacks.   CAROTID STENT     CARPAL TUNNEL RELEASE Right 10/21/2013   Procedure: RIGHT CARPAL TUNNEL RELEASE;  Surgeon: Wynonia Sours, MD;  Location: [place];  Service: Orthopedics;  Laterality: Right;   CHOLECYSTECTOMY     COLON SURGERY     COLONOSCOPY     COLOSTOMY REVERSAL  05/21/2018   ileostomy reversal   CYSTOSCOPY WITH STENT PLACEMENT Bilateral 01/13/2018   Procedure: CYSTOSCOPY WITH BILATERAL URETERAL CATHETER PLACEMENT;  Surgeon: Ardis Hughs, MD;  Location: [facility];  Service: Urology;  Laterality: Bilateral;   ESOPHAGEAL MANOMETRY Bilateral 07/19/2014   Procedure: ESOPHAGEAL MANOMETRY (EM);  Surgeon: Jerene Bears, MD;  [facility];  Service: Gastroenterology;  Laterality: Bilateral;   EYE SURGERY Bilateral 2020   cataract   FEMORAL ARTERY STENT     x6   FINGER SURGERY      FOOT SURGERY  04/25/2016    EXCISION BASE 5TH METATARSAL AND PARTIAL CUBOID LEFT FOOT   HERNIA REPAIR     LEFT INGUINAL AND UMBILICAL REPAIRS   HERNIA REPAIR     I & D EXTREMITY Left 04/25/2016   Procedure: EXCISION BASE 5TH METATARSAL AND PARTIAL CUBOID LEFT FOOT;  Surgeon: Newt Minion, MD;  Location: [place];  Service: Orthopedics;  Laterality: Left;   ILEOSTOMY  01/13/2018   Procedure: ILEOSTOMY;  Surgeon: Clovis Riley, MD;  Location: [facility];  Service: General;;   ILEOSTOMY CLOSURE N/A 05/21/2018   Procedure: ILEOSTOMY REVERSAL ERAS PATHWAY;  Surgeon: Clovis Riley, MD;  Location: [facility];  Service: General;  Laterality: N/A;   IR RADIOLOGIST EVAL & MGMT  11/19/2017   IR RADIOLOGIST EVAL & MGMT  12/03/2017   IR RADIOLOGIST EVAL & MGMT  12/18/2017   JOINT REPLACEMENT Right 2001   Total knee   LAMINECTOMY     X 3 LUMBAR AND X 2 CERVICAL  SPINE OPERATIONS   LAPAROSCOPIC CHOLECYSTECTOMY W/ CHOLANGIOGRAPHY  11/09/04   SURGEON DR. [place] CATH AND CORONARY ANGIOGRAPHY N/A 09/16/2018   Procedure: LEFT HEART CATH AND CORONARY ANGIOGRAPHY;  Surgeon: Nigel Mormon, MD;  Location: [facility];  Service: Cardiovascular;  Laterality: N/A;   LEFT HEART CATHETERIZATION WITH CORONARY ANGIOGRAM N/A 10/29/2014   Procedure: LEFT HEART CATHETERIZATION WITH CORONARY ANGIOGRAM;  Surgeon: Laverda Page, MD;  Location: [facility];  Service: Cardiovascular;  Laterality: N/A;   LIGATION OF COMPETING BRANCHES OF ARTERIOVENOUS FISTULA Left 07/21/2020   Procedure: LIGATION OF COMPETING BRANCHES OF LEFT UPPER ARM ARTERIOVENOUS FISTULA;  Surgeon: Waynetta Sandy, MD;  Location: [place];  Service: Vascular;  Laterality: Left;   LOWER EXTREMITY ANGIOGRAM N/A 03/19/2012   Procedure: LOWER EXTREMITY ANGIOGRAM;  Surgeon: Burnell Blanks, MD;  Location: [facility];  Service: Cardiovascular;  Laterality: N/A;   LOWER EXTREMITY ANGIOGRAPHY N/A 06/20/2021   Procedure: LOWER  EXTREMITY ANGIOGRAPHY;  Surgeon: Nigel Mormon, MD;  Location: [place] [facility];  Service: Cardiovascular;  Laterality: N/A;   NECK SURGERY     PARTIAL COLECTOMY N/A 01/13/2018   Procedure: LAPAROSCOPIC ASSISTED   SIGMOID COLECTOMY ILEOSTOMY;  Surgeon: Clovis Riley, MD;  Location[facility];  Service: General;  Laterality: N/A;   PENILE PROSTHESIS IMPLANT  08/14/05   INFRAPUBIC INSERTION OF INFLATABLE PENILE PROSTHESIS; SURGEON DR. Amalia Hailey   PENILE PROSTHESIS IMPLANT     PERCUTANEOUS CORONARY STENT INTERVENTION (PCI-S) Right 10/29/2014   Procedure: PERCUTANEOUS CORONARY STENT INTERVENTION (PCI-S);  Surgeon: Laverda Page, MD;  [facility];  Service: Cardiovascular;  Laterality: Right;   PERIPHERAL VASCULAR INTERVENTION Left 06/23/2021   Procedure: PERIPHERAL VASCULAR INTERVENTION;  Surgeon: Nigel Mormon, MD;  Location: [place] [facility];  Service: Cardiovascular;  Laterality: Left;   REMOVAL OF PENILE PROSTHESIS N/A 06/14/2021   Procedure: Removal of THREE piece inflatable penile prosthesis;  Surgeon: Lucas Mallow, MD;  Location: [place];  Service: Urology;  Laterality: N/A;   SHOULDER ARTHROSCOPY     SPINE SURGERY     TOE AMPUTATION Left    TONSILLECTOMY     TOTAL KNEE ARTHROPLASTY  07/2002   RIGHT KNEE ; SURGEON  DR. [name] ALSO HAD ARTHROSCOPIC RIGHT KNEE IN  10/2001   TOTAL KNEE ARTHROPLASTY     ULNAR NERVE TRANSPOSITION Right 10/21/2013   Procedure: RIGHT ELBOW  ULNAR NERVE DECOMPRESSION;  Surgeon: Wynonia Sours, MD;  Location: [place];  Service: Orthopedics;  Laterality: Right;   Social History   Tobacco Use   Smoking status: Former    Packs/day: 2.00    Years: 35.00    Pack years: 70.00    Types: Cigarettes    Quit date: 10/28/2011    Years since quitting: 9.8   Smokeless tobacco: Never  Substance Use Topics   Alcohol use: Not Currently    Comment: "not in a long time"    Family History  Problem Relation Age of Onset   Heart  disease Father        Before age 67-  CAD, BPG   Diabetes Father        Amputation   Cancer Father        PROSTATE   Hyperlipidemia Father    Hypertension Father    Heart attack Father        Triple BPG   Varicose Veins Father  Colon cancer Brother    Diabetes Brother    Heart disease Brother 55       A-Fib. Before age 4   Hyperlipidemia Brother    Hypertension Brother    Cancer Sister        Breast   Hyperlipidemia Sister    Hypertension Sister    Hypertension Son    Arthritis Other        GRANDMOTHER   Hypertension Other        OTHER FAMILY MEMBERS    Marital Status: Married  ROS  Review of Systems  Constitutional: Positive for malaise/fatigue.  Cardiovascular:  Negative for chest pain and claudication.  Respiratory:  Positive for cough and shortness of breath. Negative for hemoptysis.   Skin:  Positive for nail changes and poor wound healing.  Musculoskeletal:  Positive for arthritis, joint pain and muscle weakness.  Gastrointestinal: Negative.   Genitourinary: Negative.   All other systems reviewed and are negative. Objective   Vitals with BMI 09/06/2021 09/06/2021 09/06/2021  Height - - -  Weight - - -  BMI - - -  Systolic 761 607 371  Diastolic 66 58 65  Pulse 88 95 98    Blood pressure 135/66, pulse 88, temperature 98.8 F (37.1 C), temperature source Oral, resp. rate 17, height 6\' 2"  (1.88 m), weight 100.7 kg, SpO2 98 %.    Physical Exam Constitutional:      General: He is not in acute distress. HENT:     Head: Normocephalic.     Mouth/Throat:     Mouth: Mucous membranes are moist.  Neck:     Vascular: Carotid bruit (Bilateral) present.  Cardiovascular:     Rate and Rhythm: Normal rate. Rhythm irregular.     Pulses:          Dorsalis pedis pulses are 0 on the right side.       Posterior tibial pulses are 0 on the right side.     Heart sounds: No murmur heard.    Comments: Left foot ulceration noted covered in tape.  Swollen foot.  Chronic  ischemic changes in the entire left leg below the knee.  Right below-knee amputation.  Stump is healthy. Pulmonary:     Breath sounds: Rales (Left base) present.  Abdominal:     General: Abdomen is flat. Bowel sounds are normal.     Palpations: Abdomen is soft.  Genitourinary:    Comments: Drainage tube from the pubic area noted.  Postsurgical Staples  in the suprapubic region noted. Musculoskeletal:        General: Deformity (Left foot with ulceration and partial toe amputations from previous surgeries.  Dry, wrinkled without edema.  No foul-smelling discharge.) present.     Cervical back: Neck supple.     Right lower leg: No edema.  Neurological:     General: No focal deficit present.     Mental Status: He is alert and oriented to person, place, and time.  Psychiatric:        Mood and Affect: Mood normal.        Behavior: Behavior normal.   Laboratory examination:   Recent Labs    09/04/21 0351 09/05/21 0151 09/06/21 0126  NA 137 135 133*  K 3.9 3.4* 4.0  CL 99 100 96*  CO2 25 21* 23  GLUCOSE 122* 129* 417*  BUN 32* 43* 30*  CREATININE 5.24* 5.74* 4.85*  CALCIUM 8.0* 7.9* 8.4*  GFRNONAA 11* 10* 12*    estimated  creatinine clearance is 18.7 mL/min (A) (by C-G formula based on SCr of 4.85 mg/dL (H)).  CMP Latest Ref Rng & Units 09/06/2021 09/05/2021 09/04/2021  Glucose 70 - 99 mg/dL 417(H) 129(H) 122(H)  BUN 8 - 23 mg/dL 30(H) 43(H) 32(H)  Creatinine 0.61 - 1.24 mg/dL 4.85(H) 5.74(H) 5.24(H)  Sodium 135 - 145 mmol/L 133(L) 135 137  Potassium 3.5 - 5.1 mmol/L 4.0 3.4(L) 3.9  Chloride 98 - 111 mmol/L 96(L) 100 99  CO2 22 - 32 mmol/L 23 21(L) 25  Calcium 8.9 - 10.3 mg/dL 8.4(L) 7.9(L) 8.0(L)  Total Protein 6.5 - 8.1 g/dL 5.9(L) 5.1(L) 5.3(L)  Total Bilirubin 0.3 - 1.2 mg/dL 0.6 0.8 0.4  Alkaline Phos 38 - 126 U/L 98 89 84  AST 15 - 41 U/L 18 17 17   ALT 0 - 44 U/L 13 9 11    CBC Latest Ref Rng & Units 09/06/2021 09/05/2021 09/04/2021  WBC 4.0 - 10.5 K/uL 2.3(L)  3.8(L) 4.2  Hemoglobin 13.0 - 17.0 g/dL 11.9(L) 10.2(L) 10.4(L)  Hematocrit 39.0 - 52.0 % 34.9(L) 30.1(L) 30.7(L)  Platelets 150 - 400 K/uL 117(L) 103(L) 93(L)   Lipid Panel No results for input(s): CHOL, TRIG, LDLCALC, VLDL, HDL, CHOLHDL, LDLDIRECT in the last 8760 hours.  HEMOGLOBIN A1C Lab Results  Component Value Date   HGBA1C 7.9 (H) 06/14/2021   MPG 180.03 06/14/2021   TSH No results for input(s): TSH in the last 8760 hours. BNP (last 3 results)  Medications and allergies   Allergies  Allergen Reactions   Contrast Media [Iodinated Contrast Media] Shortness Of Breath and Other (See Comments)    Difficulty breathing and altered mental status     Ivp Dye [Iodinated Contrast Media] Anaphylaxis, Shortness Of Breath and Other (See Comments)    Breathing problems, altered mental state    Adhesive [Tape] Rash and Other (See Comments)    Rash after 1 day of use   Latex Rash and Other (See Comments)    A severe rash appears after the first 24 hours of being placed     Current Meds  Medication Sig   amLODipine (NORVASC) 5 MG tablet Take 5 mg by mouth daily.   apixaban (ELIQUIS) 5 MG TABS tablet Take 5 mg by mouth See admin instructions. Take 5 mg by mouth two times a day only on Sun/Mon/Wed/Fri non-dialysis days   atorvastatin (LIPITOR) 40 MG tablet Take 40 mg by mouth at bedtime.    doxycycline (VIBRA-TABS) 100 MG tablet Take 1 tablet (100 mg total) by mouth 2 (two) times daily. (Patient taking differently: Take 100 mg by mouth See admin instructions. Bid x 30 days)   famotidine (PEPCID) 40 MG tablet Take 40 mg by mouth daily.   folic acid-vitamin b complex-vitamin c-selenium-zinc (DIALYVITE) 3 MG TABS tablet Take 1 tablet by mouth daily.   Glucosamine HCl (GLUCOSAMINE PO) Take 1 tablet by mouth 2 (two) times daily.   insulin glargine (LANTUS) 100 UNIT/ML injection Inject 0.1 mLs (10 Units total) into the skin at bedtime.   ketoconazole (NIZORAL) 2 % cream Apply 1 application  topically 2 (two) times daily as needed for irritation.    lidocaine-prilocaine (EMLA) cream Apply 1 application topically See admin instructions. Prior to Dialysis days Tuesday,Thursday and saturday   linaclotide (LINZESS) 145 MCG CAPS capsule Take 145 mcg by mouth daily as needed (constipation).    loratadine (CLARITIN) 10 MG tablet Take 10 mg by mouth daily.   NEEDLE, REUSABLE, 22 G 22G X 1-1/2" MISC 1 Units  by Does not apply route as directed. For B12 IM inj   nitroGLYCERIN (NITRODUR - DOSED IN MG/24 HR) 0.2 mg/hr patch Place 1 patch (0.2 mg total) onto the skin daily.   omega-3 acid ethyl esters (LOVAZA) 1 g capsule Take 2 g by mouth 2 (two) times daily.   ondansetron (ZOFRAN) 4 MG tablet Take 4 mg by mouth every 6 (six) hours as needed for nausea or vomiting.   pantoprazole (PROTONIX) 40 MG tablet Take 40 mg by mouth daily.   Polyvinyl Alcohol-Povidone PF 1.4-0.6 % SOLN Place 1 drop into both eyes 2 (two) times daily as needed (dry eyes).    pregabalin (LYRICA) 75 MG capsule Take 1 capsule (75 mg total) by mouth daily for 14 days. (Patient taking differently: Take 75 mg by mouth at bedtime.)   rOPINIRole (REQUIP) 2 MG tablet Take 2 mg by mouth at bedtime.   Syringe/Needle, Disp, (SYRINGE 3CC/22GX1-1/2") 22G X 1-1/2" 3 ML MISC 1 Syringe by Does not apply route as directed. For b12 IM inj   tamsulosin (FLOMAX) 0.4 MG CAPS capsule Take 0.4 mg by mouth daily.   [DISCONTINUED] fentaNYL (DURAGESIC - DOSED MCG/HR) 50 MCG/HR Place 50 mcg onto the skin every 3 (three) days.   [DISCONTINUED] HUMALOG KWIKPEN 100 UNIT/ML KiwkPen Inject 13 Units into the skin 3 (three) times daily.   [DISCONTINUED] oxyCODONE-acetaminophen (PERCOCET) 7.5-325 MG tablet Take 1 tablet by mouth 3 (three) times daily as needed for moderate pain.    Scheduled Meds:  apixaban  5 mg Oral BID   atorvastatin  40 mg Oral QHS   darbepoetin (ARANESP) injection - DIALYSIS  60 mcg Intravenous Q Tue-HD   famotidine  20 mg Oral Daily    fentaNYL  1 patch Transdermal Q72H   insulin aspart  0-6 Units Subcutaneous TID WC   insulin glargine-yfgn  10 Units Subcutaneous QHS   loratadine  10 mg Oral Daily   nitroGLYCERIN  1 inch Topical Q6H   omega-3 acid ethyl esters  2 g Oral BID   pantoprazole  40 mg Oral Daily   pregabalin  75 mg Oral QHS   rOPINIRole  2 mg Oral QHS   tamsulosin  0.4 mg Oral Daily   traZODone  50 mg Oral Once   Continuous Infusions:  vancomycin 1,000 mg (09/05/21 1013)   PRN Meds:.acetaminophen, hydrALAZINE, linaclotide, ondansetron (ZOFRAN) IV, oxyCODONE-acetaminophen   I/O last 3 completed shifts: In: 6767 [P.O.:857; IV Piggyback:200] Out: 4500 [Urine:1500; Other:3000] No intake/output data recorded.   Radiology:   DG CHEST PORT 1 VIEW  Result Date: 09/05/2021 CLINICAL DATA:  Hypoxia EXAM: PORTABLE CHEST 1 VIEW COMPARISON:  09/02/2021 FINDINGS: Transverse diameter of heart is slightly increased. There are no signs of alveolar pulmonary edema or new focal pulmonary consolidation. Left hemidiaphragm is elevated. There is crowding of markings in the left lower lung fields. There is improvement in aeration of right lower lung fields. There is no pleural effusion or pneumothorax. IMPRESSION: Increased markings in the left lower lung fields may be due to crowding of normal bronchovascular structures caused by elevation of left hemidiaphragm or subsegmental atelectasis. There is no focal pulmonary consolidation. There is no pleural effusion or pneumothorax. Electronically Signed   By: Elmer Picker M.D.   On: 09/05/2021 14:00    Cardiac Studies:   Lexiscan sestamibi stress test 03/18/2017: 1. The resting electrocardiogram demonstrated normal sinus rhythm, RBBB and no resting arrhythmias. Stress EKG is non-diagnostic for ischemia as it a pharmacologic stress using Lexiscan. Occasional  PVC noted. Stress symptoms included dyspnea, dizziness and chest pain. 2. The LV is dilated both at rest and stress  images. The LV end diastolic volume was 086VH. Perfusion images reveal inferior soft tissue attenuation artifact. There is no ischemia or scar. Overall left ventricular systolic function was normal without regional wall motion abnormalities. The left ventricular ejection fraction was calculated to be 51%. This is a low risk study. Compared to the study done on 10/15/2014, inferior wall ischemia and associated inferior hypokinesis is no longer present.   Coronary angiogram 09/17/18: Widely patent stent stents. Normal LVEF. Coronary Angiogram 10/29/2014: CAD s/p stenting of the distal, mid and proximal RCA with implantation of 3 overlapping drug-eluting stent, from distal to proximal 2.5 x 38 mm, 2.75 x 38 mm and a 2.75 x 20 mm Promus premier DES.    Lower Extremity Arterial Duplex 06/22/21: Right: Multiphasic waveforms noted in the CFA, FA, PFA, and PTA. Monophasic flow noted in the popliteal artery indicative of a more proximal stenosis. Monophasic flow in the anterior tibial artery.  Peripheral arteriogram 06/23/2021: Proximally occluded left anterior tibial artery Successful revascularization with PTA 3.0 x 220, 2.0 x 220, 1.5 x 40 mm over-the-wire balloons 0% residual stenosis, but sluggish flow into pedal plantar arch  Echocardiogram 06/17/2021: 1. Left ventricular ejection fraction, by estimation, is 55 to 60%. The left ventricle has normal function. The left ventricle has no regional wall motion abnormalities. There is mild concentric left ventricular hypertrophy. Left ventricular diastolic  parameters are indeterminate.  2. Right ventricular systolic function is normal. The right ventricular size is normal.  3. Left atrial size was moderately dilated.  4. The mitral valve is normal in structure. No evidence of mitral valve regurgitation. Moderate mitral annular calcification.  5. The aortic valve is tricuspid. Aortic valve regurgitation is not visualized. Mild to moderate aortic valve  sclerosis/calcification is present, without any evidence of aortic stenosis.  EKG:  EKG 09/06/2021: Sinus rhythm with borderline first-degree AV block at rate of 104 bpm, left axis deviation, left anterior fascicular block.  Right bundle branch block.  Trifascicular block.  Minimal criteria by voltage for LVH.  EKG 06/13/2021: Atypical atrial flutter with RVR at rate of 126 bpm, left axis deviation, left anterior fascicular block, right bundle branch block.  Minimal criteria for LVH in aVL.  Telemetry 09/06/2021: A. fib with RVR.  Assessment   [name]  is a 65 y.o. Caucasian male patient with severe peripheral arterial disease and has history of right below-knee amputation and toe lamputations on the left, multiple peripheral interventions in the past, uncontrolled diabetes mellitus, ESRD, severe diabetic retinopathy, coronary artery disease angioplasty to the right coronary artery on 10/29/2014, right carotid endarterectomy in 2012 , OSA Unable to tolerate CPAP, chronic back pain and neck and back surgery in past and on chronic pain medications, persistent atrial fibrillation on Eliquis, chronic left foot ulceration from severe PAD and DM admitted to the hospital on 09/02/2021 with community-acquired pneumonia, Continued tobacco use disorder with vape use admitted to the hospital on 09/02/2021 with  altered mental status, sepsis and hypoxemic respiratory failure.  He was also found to have COVID-19 pneumonia as well.  Since being on antibiotics symptoms improved, however he had an episode of syncope after dialysis today and also due to A. fib with RVR I was consulted for management.  1.  Paroxysmal atrial fibrillation with rapid ventricular response CHA2DS2-VASc Score is 4.  Yearly risk of stroke: 4.8% (A, HTN, DM, Vasc Dz).  Score of 1=0.6; 2=2.2; 3=3.2; 4=4.8; 5=7.2; 6=9.8; 7=>9.8) -(CHF; HTN; vasc disease DM,  Male = 1; Age <65 =0; 65-74 = 1,  >75 =2; stroke/embolism= 2). 2.  Syncope that  occurred after dialysis, patient has history of altered mental status that occurs when he drops his blood pressure below 120 mmHg. 3.  CAD, stable without recurrence of angina 4.  Diabetes mellitus with end-stage renal disease presently on hemodialysis 5.  Peripheral arterial disease, nonhealing left foot ulcer, no revascularization options. 6.  Tobacco use disorder and nicotine dependence severe, patient uses vapes.  Recommendations:   Patient is asymptomatic in spite of A. fib with RVR.  He has been off of his rate controlling agents due to low blood pressure.  Patient also having issues with low blood pressure during dialysis.  I will start him on amiodarone oral 200 mg p.o. twice daily, patient also has underlying trifascicular block when he was in sinus rhythm, it places him at high risk for heart block although he is only 65 years of age.  However we will cross the path if he ever reach there.  I did not want to start him on any metoprolol or diltiazem due to low blood pressure.  Episode of syncope that occurred today with altered mental status after dialysis.  Related to low blood pressure.  Patient has been having frequent episodes like this even in the outpatient basis.  He has developed autonomic insufficiency from longstanding diabetes mellitus.  I would like to try midodrine 10 mg in the morning 1 hour prior to dialysis and see whether this would help.  We again discussed regarding smoking cessation.  I also spoke to his wife over the telephone extensively.  Peripheral arterial disease is end-stage.  No real options for revascularization.  His activity has also been significantly limited now he is essentially wheelchair-bound due to marked weakness in his legs and also arthritis in his left knee.  Overall poor outcome and long-term poor prognosis.  Patient also has chronic pain syndrome, patient is now enrolled in palliative care to manage his pain as patient is having difficulty seeing  doctors due to mobility.  I agree with this approach.  Patient was on Lyrica 100 mg 3 times daily previously, has been discontinued, he would like to restart as he started to have phantom limb pain and also pain in his left leg.  Would recommend starting with 100 mg twice daily only and see how he does.  Wife is wondering whether he needs home oxygen as he has pneumonia.  His oxygen saturations have been stable and his activity is limited hence do not think he needs home oxygen therapy.  We will follow him up in the outpatient basis, upon discharge, amiodarone 200 mg daily is adequate after 10 days of 200 mg twice daily dosing.   Adrian Prows, MD, [facility] 09/06/2021, 5:35 PM Office: [phone] Fax: [phone] Pager: [phone]

## 2021-09-06 NOTE — Progress Notes (Signed)
Pt weaned off O2 as per verbal order from Dr. Sloan Leiter, now 96-97% on RA.  He was feeling SOB last night while O2 was WNL and was placed on 1L for comfort.  States that he often feels SOB at home as well.  Pt still to be seen by cardiology and was advised to mention this to [name].

## 2021-09-07 ENCOUNTER — Other Ambulatory Visit: Payer: Self-pay

## 2021-09-07 DIAGNOSIS — U071 COVID-19: Secondary | ICD-10-CM | POA: Diagnosis not present

## 2021-09-07 DIAGNOSIS — Z89511 Acquired absence of right leg below knee: Secondary | ICD-10-CM

## 2021-09-07 DIAGNOSIS — N186 End stage renal disease: Secondary | ICD-10-CM | POA: Diagnosis not present

## 2021-09-07 DIAGNOSIS — M86172 Other acute osteomyelitis, left ankle and foot: Secondary | ICD-10-CM | POA: Diagnosis not present

## 2021-09-07 DIAGNOSIS — A419 Sepsis, unspecified organism: Secondary | ICD-10-CM | POA: Diagnosis not present

## 2021-09-07 LAB — CBC WITH DIFFERENTIAL/PLATELET
Abs Immature Granulocytes: 0.02 10*3/uL (ref 0.00–0.07)
Basophils Absolute: 0 10*3/uL (ref 0.0–0.1)
Basophils Relative: 0 %
Eosinophils Absolute: 0 10*3/uL (ref 0.0–0.5)
Eosinophils Relative: 0 %
HCT: 33 % — ABNORMAL LOW (ref 39.0–52.0)
Hemoglobin: 11.1 g/dL — ABNORMAL LOW (ref 13.0–17.0)
Immature Granulocytes: 0 %
Lymphocytes Relative: 17 %
Lymphs Abs: 0.8 10*3/uL (ref 0.7–4.0)
MCH: 29.8 pg (ref 26.0–34.0)
MCHC: 33.6 g/dL (ref 30.0–36.0)
MCV: 88.7 fL (ref 80.0–100.0)
Monocytes Absolute: 0.4 10*3/uL (ref 0.1–1.0)
Monocytes Relative: 8 %
Neutro Abs: 3.6 10*3/uL (ref 1.7–7.7)
Neutrophils Relative %: 75 %
Platelets: 151 10*3/uL (ref 150–400)
RBC: 3.72 MIL/uL — ABNORMAL LOW (ref 4.22–5.81)
RDW: 14.1 % (ref 11.5–15.5)
WBC: 4.8 10*3/uL (ref 4.0–10.5)
nRBC: 0 % (ref 0.0–0.2)

## 2021-09-07 LAB — COMPREHENSIVE METABOLIC PANEL
ALT: 14 U/L (ref 0–44)
AST: 16 U/L (ref 15–41)
Albumin: 3 g/dL — ABNORMAL LOW (ref 3.5–5.0)
Alkaline Phosphatase: 96 U/L (ref 38–126)
Anion gap: 13 (ref 5–15)
BUN: 44 mg/dL — ABNORMAL HIGH (ref 8–23)
CO2: 22 mmol/L (ref 22–32)
Calcium: 8.6 mg/dL — ABNORMAL LOW (ref 8.9–10.3)
Chloride: 98 mmol/L (ref 98–111)
Creatinine, Ser: 5.82 mg/dL — ABNORMAL HIGH (ref 0.61–1.24)
GFR, Estimated: 10 mL/min — ABNORMAL LOW (ref >60)
Glucose, Bld: 335 mg/dL — ABNORMAL HIGH (ref 70–99)
Potassium: 3.8 mmol/L (ref 3.5–5.1)
Sodium: 133 mmol/L — ABNORMAL LOW (ref 135–145)
Total Bilirubin: 0.8 mg/dL (ref 0.3–1.2)
Total Protein: 5.9 g/dL — ABNORMAL LOW (ref 6.5–8.1)

## 2021-09-07 LAB — GLUCOSE, CAPILLARY
Glucose-Capillary: 369 mg/dL — ABNORMAL HIGH (ref 70–99)
Glucose-Capillary: 348 mg/dL — ABNORMAL HIGH (ref 70–99)
Glucose-Capillary: 308 mg/dL — ABNORMAL HIGH (ref 70–99)
Glucose-Capillary: 290 mg/dL — ABNORMAL HIGH (ref 70–99)
Glucose-Capillary: 138 mg/dL — ABNORMAL HIGH (ref 70–99)

## 2021-09-07 LAB — CULTURE, BLOOD (ROUTINE X 2)
Culture: NO GROWTH
Culture: NO GROWTH
Special Requests: ADEQUATE

## 2021-09-07 LAB — C-REACTIVE PROTEIN: CRP: 2.7 mg/dL — ABNORMAL HIGH (ref <1.0)

## 2021-09-07 LAB — D-DIMER, QUANTITATIVE: D-Dimer, Quant: <0.27 ug/mL-FEU (ref 0.00–0.50)

## 2021-09-07 MED ORDER — INSULIN ASPART 100 UNIT/ML IJ SOLN
5.0000 [IU] | Freq: Once | INTRAMUSCULAR | Status: AC
Start: 2021-09-07 — End: 2021-09-07
  Administered 2021-09-07: 05:00:00 5 [IU] via SUBCUTANEOUS

## 2021-09-07 MED ORDER — APIXABAN 5 MG PO TABS: 5.0000 mg | ORAL_TABLET | Freq: Two times a day (BID) | ORAL | 0 refills | Status: DC

## 2021-09-07 MED ORDER — AMIODARONE HCL 200 MG PO TABS: 200.0000 mg | ORAL_TABLET | Freq: Every day | ORAL | 1 refills | Status: DC

## 2021-09-07 MED ORDER — MIDODRINE HCL 10 MG PO TABS: 10.0000 mg | ORAL_TABLET | ORAL | 3 refills | Status: DC

## 2021-09-07 MED ORDER — AMLODIPINE BESYLATE 5 MG PO TABS
5.0000 mg | ORAL_TABLET | Freq: Every day | ORAL | Status: DC
  Administered 2021-09-07: 20:00:00 5 mg via ORAL
  Filled 2021-09-07: qty 1

## 2021-09-07 MED ORDER — AMLODIPINE BESYLATE 5 MG PO TABS
5.0000 mg | ORAL_TABLET | Freq: Every day | ORAL | Status: DC
Start: 2021-09-07 — End: 2021-09-07

## 2021-09-07 NOTE — TOC Transition Note (Signed)
Transition of Care [facility]) - CM/SW Discharge Note   Patient Details  Name: [name] MRN: [id number] Date of Birth: 25-Feb-1956  Transition of Care [facility]) CM/SW Contact:  Tom-Johnson, Renea Ee, RN Phone Number: 09/07/2021, 11:00 AM   Clinical Narrative:    Patient is scheduled for discharge home today. CM called and spoke with wife, Desiree Hane and she was very upset stating that a DR. Had a conference call with her and told her patient will not be discharged before Saturday. Also, patient is wheelchair bound and does not have his wheelchair in the hospital. MD and nurse notified. Wife will transport patient by 6 pm this evening. No further TOC needs noted.   Final next level of care: Home/Self Care Barriers to Discharge: Barriers Resolved   Patient Goals and CMS Choice Patient states their goals for this hospitalization and ongoing recovery are:: To go home CMS Medicare.gov Compare Post Acute Care list provided to:: Patient Choice offered to / list presented to : NA  Discharge Placement                       Discharge Plan and Services                DME Arranged: N/A DME Agency: NA       HH Arranged: NA HH Agency: NA        Social Determinants of Health (SDOH) Interventions     Readmission Risk Interventions Readmission Risk Prevention Plan 06/30/2021  Transportation Screening Complete  Medication Review Press photographer) Complete  HRI or [place] Complete  SW Recovery Care/Counseling Consult Complete  Palliative Care Screening Not [place] Not Applicable  Some recent data might be hidden

## 2021-09-07 NOTE — Progress Notes (Signed)
[facility] Progress Note  Subjective: Patient seen in room. Breathing better. Off [place] O2.      Vitals:   09/06/21 2011 09/07/21 0344 09/07/21 0909 09/07/21 1245  BP: (!) 174/76 (!) 160/60 (!) 183/80 (!) 159/76  Pulse: 97 74 72 76  Resp: 17 18 20 20   Temp: 98.6 F (37 C) 98.4 F (36.9 C) (!) 97.4 F (36.3 C) 97.6 F (36.4 C)  TempSrc: Oral Oral Oral Oral  SpO2: 97% 97% 99% 99%  Weight:  107.4 kg    Height:        Exam:  alert, nad   no jvd  Chest faint R basilar rales, L clear  Cor reg no RG  Abd soft ntnd no ascites   Ext no LE edema   Alert, NF, ox3    L AVF+b/t        OP HD: [facility]  4h 17min  450/ 800  108 kg 2/2 bath  L AVF  Heparin none   - mircera 50 q2 last 12/13, due 12/27   - venofer 50 per week  CXR 12/24, 12/27 - no sig edema on either      Assessment/ Plan: Covid positive - per pmd, on remdesivir AMS - resolved, back to baseline Hypoxia - requiring nasal O2. Not in distress. CXR x 2 here w/o edema. Possibly hypoxia d/t COVID infection. Is under dry wt. Tolerated 3 L off w/ HD 12/27. 0.5kg under dry wt today. UF goal 2-2.5 L today on HD.   ESRD - HD TTS. Had today. Going home after HD.  Hypertension -  improved control  Atrial fibrillation - Per primary team  Anemia CKD - Hb 10-10.5, ordered aranesp 60 mcg weekly, last given 65/78 Metabolic bone disease - not on activated vit d outpt, phos 6.3, Ca ok   [name] 09/07/2021, 2:02 PM  Recent Labs  Lab 09/04/21 0351 09/05/21 0151 09/06/21 0126 09/07/21 0238  K 3.9   < > 4.0 3.8  BUN 32*   < > 30* 44*  CREATININE 5.24*   < > 4.85* 5.82*  CALCIUM 8.0*   < > 8.4* 8.6*  PHOS 6.3*  --   --   --   HGB 10.4*   < > 11.9* 11.1*   < > = values in this interval not displayed.    Inpatient medications:  amiodarone  200 mg Oral BID   amLODipine  5 mg Oral Daily   apixaban  5 mg Oral BID   atorvastatin  40 mg Oral QHS   darbepoetin (ARANESP) injection - DIALYSIS  60 mcg  Intravenous Q Tue-HD   famotidine  20 mg Oral Daily   fentaNYL  1 patch Transdermal Q72H   insulin aspart  0-6 Units Subcutaneous TID AC & HS   insulin glargine-yfgn  10 Units Subcutaneous QHS   loratadine  10 mg Oral Daily   midodrine  10 mg Oral QODAY   nitroGLYCERIN  1 inch Topical Q6H   omega-3 acid ethyl esters  2 g Oral BID   pantoprazole  40 mg Oral Daily   pregabalin  75 mg Oral BID   rOPINIRole  2 mg Oral QHS   tamsulosin  0.4 mg Oral Daily   traZODone  50 mg Oral Once    vancomycin 1,000 mg (09/05/21 1013)   acetaminophen, hydrALAZINE, linaclotide, ondansetron (ZOFRAN) IV, oxyCODONE-acetaminophen

## 2021-09-07 NOTE — Progress Notes (Signed)
D/C order noted this am. Contacted [place] and spoke to [place]. Clinic able to provide HD treatment today if pt arrives by 12:30-1:00. Spoke to pt via phone due to pt's covid diagnosis. Pt reports that his wife is at work in [place] and son is at work as well. Pt also proceeds to say that his wheelchair is not here at the hospital in order for alternative transportation to be arranged. Pt will require inpt HD due to the above circumstances. Pt informed navigator that he has no other friends or family that can assist with transport/obtaining wheelchair from home. Updated team via secure chat. [place] aware pt for d/c today and will resume on Saturday. Renal provider will provide order for iv abx to clinic at d/c.   Melven Sartorius Renal Navigator [phone]

## 2021-09-07 NOTE — Progress Notes (Signed)
PROGRESS NOTE    [name]  SJG:[id number] DOB: 09-Jul-1956 DOA: 09/02/2021 PCP: [name], PA-C    Brief Narrative:  65 year old gentleman with ESRD on hemodialysis, chronic A. fib, type 2 diabetes was found confused and febrile while getting hemodialysis and brought to ER.  In the emergency room temperature 103, heart rate 130, COVID test positive, lactic acid 4.1.  Found to have left foot chronic wound now infected with evidence of osteomyelitis.   Assessment & Plan:   Principal Problem:   Sepsis ([place]) Active Problems:   CAD (dz of distal, mid and proximal RCA with implantation of 3 overlapping drug-eluting stent,)   Insulin dependent type 2 diabetes mellitus ([place])   Acute osteomyelitis of left foot (HCC)   S/P BKA (below knee amputation), right (HCC)   S/P carotid endarterectomy   OSA on CPAP   Chronic back pain   Chronic obstructive pulmonary disease, unspecified ([place])   ESRD on hemodialysis ([place])   COVID   Acute metabolic encephalopathy  Severe sepsis present on admission, resolved.  Thrombocytopenia secondary to sepsis.  Left foot chronic wound with acute on chronic osteomyelitis: Clinically improving.  Seen by orthopedics and ID.  Recommended 6 weeks of vancomycin with hemodialysis.  Nephrology to arrange vancomycin infusion after each dialysis.  Pneumonia due to COVID-19: Received 3 days of remdesivir.  Was treated with high-dose steroids.  Wean off oxygen.  Inflammatory markers are within normal limits.  Discontinue steroids.  Paroxysmal A. fib: Patient had intermittent RVR while in the hospital.  He started on amiodarone.  Converted to sinus rhythm.  therapeutic on Eliquis.  Seen by his cardiologist.  Coronary artery disease status post stenting: Fairly stable.  He had some chest pain related to tachycardia.  Improved with nitro patch.  Also has chronic pain syndrome and takes multiple medications.    ESRD on hemodialysis: TTS schedule.  Getting dialysis today  before discharge.  Uncontrolled type 2 diabetes with hyperglycemia: Aggravated by use of steroids.   Steroids discontinued.  Resume home doses of insulin.    After the sleep apnea: Using CPAP at night.  Adequately improved.  Multiple issues and follow-up with cardiology and orthopedics as outpatient.  Can go home with family today after hemodialysis.  He could not arrange transportation to go to outpatient dialysis today.   DVT prophylaxis:  apixaban (ELIQUIS) tablet 5 mg   Code Status: Full code Family Communication: None Disposition Plan: Status is: Inpatient  Remains inpatient appropriate because: No more inpatient appropriate.  Discharging today.         Consultants:  Cardiology  Procedures:  Routine hemodialysis  Antimicrobials:  Vancomycin with dialysis   Subjective:  Seen and examined.  No overnight events.  No chest pain or shortness of breath.  On room air.  Hesitant to go home but he will go home later today after dialysis.  Objective: Vitals:   09/06/21 2011 09/07/21 0344 09/07/21 0909 09/07/21 1245  BP: (!) 174/76 (!) 160/60 (!) 183/80 (!) 159/76  Pulse: 97 74 72 76  Resp: 17 18 20 20   Temp: 98.6 F (37 C) 98.4 F (36.9 C) (!) 97.4 F (36.3 C) 97.6 F (36.4 C)  TempSrc: Oral Oral Oral Oral  SpO2: 97% 97% 99% 99%  Weight:  107.4 kg    Height:        Intake/Output Summary (Last 24 hours) at 09/07/2021 1433 Last data filed at 09/07/2021 1253 Gross per 24 hour  Intake --  Output 1075 ml  Net -1075 ml   Filed Weights   09/05/21 1128 09/06/21 0513 09/07/21 0344  Weight: 107.5 kg 100.7 kg 107.4 kg    Examination:  General exam: Appears calm and comfortable  Respiratory system: Bilateral clear. Cardiovascular system: S1 & S2 heard, irregularly irregular.  Gastrointestinal system: Soft.  Nondistended.  Bowel sound present. Central nervous system: Alert and oriented. No focal neurological deficits. Extremities:  Patient has right  below-knee amputation with prosthesis fitted. Patient has left lateral condyle wound, chronic ischemic changes.    Data Reviewed: I have personally reviewed following labs and imaging studies  CBC: Recent Labs  Lab 09/03/21 0122 09/04/21 0351 09/05/21 0151 09/06/21 0126 09/07/21 0238  WBC 5.4 4.2 3.8* 2.3* 4.8  NEUTROABS 4.2 2.6 2.1 1.9 3.6  HGB 9.2* 10.4* 10.2* 11.9* 11.1*  HCT 28.0* 30.7* 30.1* 34.9* 33.0*  MCV 92.1 91.6 89.9 88.1 88.7  PLT 90* 93* 103* 117* 283   Basic Metabolic Panel: Recent Labs  Lab 09/03/21 0122 09/04/21 0351 09/05/21 0151 09/06/21 0126 09/07/21 0238  NA 137 137 135 133* 133*  K 3.7 3.9 3.4* 4.0 3.8  CL 98 99 100 96* 98  CO2 30 25 21* 23 22  GLUCOSE 228* 122* 129* 417* 335*  BUN 17 32* 43* 30* 44*  CREATININE 4.01* 5.24* 5.74* 4.85* 5.82*  CALCIUM 7.5* 8.0* 7.9* 8.4* 8.6*  PHOS  --  6.3*  --   --   --    GFR: Estimated Creatinine Clearance: 16.1 mL/min (A) (by C-G formula based on SCr of 5.82 mg/dL (H)). Liver Function Tests: Recent Labs  Lab 09/03/21 0122 09/04/21 0351 09/05/21 0151 09/06/21 0126 09/07/21 0238  AST 15 17 17 18 16   ALT 11 11 9 13 14   ALKPHOS 93 84 89 98 96  BILITOT 0.5 0.4 0.8 0.6 0.8  PROT 5.1* 5.3* 5.1* 5.9* 5.9*  ALBUMIN 2.6* 2.6* 2.7* 3.1* 3.0*   No results for input(s): LIPASE, AMYLASE in the last 168 hours. No results for input(s): AMMONIA in the last 168 hours. Coagulation Profile: No results for input(s): INR, PROTIME in the last 168 hours. Cardiac Enzymes: No results for input(s): CKTOTAL, CKMB, CKMBINDEX, TROPONINI in the last 168 hours. BNP (last 3 results) No results for input(s): PROBNP in the last 8760 hours. HbA1C: No results for input(s): HGBA1C in the last 72 hours. CBG: Recent Labs  Lab 09/06/21 2156 09/07/21 0213 09/07/21 0634 09/07/21 0852 09/07/21 1244  GLUCAP 542* 369* 348* 308* 290*   Lipid Profile: No results for input(s): CHOL, HDL, LDLCALC, TRIG, CHOLHDL, LDLDIRECT in the  last 72 hours. Thyroid Function Tests: No results for input(s): TSH, T4TOTAL, FREET4, T3FREE, THYROIDAB in the last 72 hours. Anemia Panel: No results for input(s): VITAMINB12, FOLATE, FERRITIN, TIBC, IRON, RETICCTPCT in the last 72 hours. Sepsis Labs: Recent Labs  Lab 09/02/21 1700 09/03/21 0122 09/03/21 0335  PROCALCITON  --  1.32  --   LATICACIDVEN 4.1* 2.3* 1.5    Recent Results (from the past 240 hour(s))  Blood culture (routine x 2)     Status: None   Collection Time: 09/02/21  5:00 PM   Specimen: BLOOD  Result Value Ref Range Status   Specimen Description BLOOD RIGHT ANTECUBITAL  Final   Special Requests   Final    BOTTLES DRAWN AEROBIC AND ANAEROBIC Blood Culture results may not be optimal due to an excessive volume of blood received in culture bottles   Culture   Final    NO GROWTH 5 DAYS  Performed at [facility], [place] [address] [place], [place] [address]    Report Status 09/07/2021 FINAL  Final  Resp Panel by RT-PCR (Flu A&B, Covid) Nasopharyngeal Swab     Status: Abnormal   Collection Time: 09/02/21  5:10 PM   Specimen: Nasopharyngeal Swab; Nasopharyngeal(NP) swabs in vial transport medium  Result Value Ref Range Status   SARS Coronavirus 2 by RT PCR POSITIVE (A) NEGATIVE Final    Comment: (NOTE) SARS-CoV-2 target nucleic acids are DETECTED.  The SARS-CoV-2 RNA is generally detectable in upper respiratory specimens during the acute phase of infection. Positive results are indicative of the presence of the identified virus, but do not rule out bacterial infection or co-infection with other pathogens not detected by the test. Clinical correlation with patient history and other diagnostic information is necessary to determine patient infection status. The expected result is Negative.  Fact Sheet for Patients: EntrepreneurPulse.com.au  Fact Sheet for Healthcare Providers: IncredibleEmployment.be  This test is not yet  approved or cleared by the [place] FDA and  has been authorized for detection and/or diagnosis of SARS-CoV-2 by FDA under an Emergency Use Authorization (EUA).  This EUA will remain in effect (meaning this test can be used) for the duration of  the COVID-19 declaration under Section 564(b)(1) of the A ct, 21 U.S.C. section 360bbb-3(b)(1), unless the authorization is terminated or revoked sooner.     Influenza A by PCR NEGATIVE NEGATIVE Final   Influenza B by PCR NEGATIVE NEGATIVE Final    Comment: (NOTE) The Xpert Xpress SARS-CoV-2/FLU/RSV plus assay is intended as an aid in the diagnosis of influenza from Nasopharyngeal swab specimens and should not be used as a sole basis for treatment. Nasal washings and aspirates are unacceptable for Xpert Xpress SARS-CoV-2/FLU/RSV testing.  Fact Sheet for Patients: EntrepreneurPulse.com.au  Fact Sheet for Healthcare Providers: IncredibleEmployment.be  This test is not yet approved or cleared by the [place] FDA and has been authorized for detection and/or diagnosis of SARS-CoV-2 by FDA under an Emergency Use Authorization (EUA). This EUA will remain in effect (meaning this test can be used) for the duration of the COVID-19 declaration under Section 564(b)(1) of the Act, 21 U.S.C. section 360bbb-3(b)(1), unless the authorization is terminated or revoked.  Performed at [facility], [place] [address] [place], [place] [address]   Blood culture (routine x 2)     Status: None   Collection Time: 09/02/21  5:38 PM   Specimen: BLOOD  Result Value Ref Range Status   Specimen Description BLOOD SITE NOT SPECIFIED  Final   Special Requests   Final    BOTTLES DRAWN AEROBIC AND ANAEROBIC Blood Culture adequate volume   Culture   Final    NO GROWTH 5 DAYS Performed at [facility], [address] [place], [place] [address]    Report Status 09/07/2021 FINAL  Final         Radiology  Studies: No results found.      Scheduled Meds:  amiodarone  200 mg Oral BID   amLODipine  5 mg Oral Daily   apixaban  5 mg Oral BID   atorvastatin  40 mg Oral QHS   darbepoetin (ARANESP) injection - DIALYSIS  60 mcg Intravenous Q Tue-HD   famotidine  20 mg Oral Daily   fentaNYL  1 patch Transdermal Q72H   insulin aspart  0-6 Units Subcutaneous TID AC & HS   insulin glargine-yfgn  10 Units Subcutaneous QHS  loratadine  10 mg Oral Daily   midodrine  10 mg Oral QODAY   nitroGLYCERIN  1 inch Topical Q6H   omega-3 acid ethyl esters  2 g Oral BID   pantoprazole  40 mg Oral Daily   pregabalin  75 mg Oral BID   rOPINIRole  2 mg Oral QHS   tamsulosin  0.4 mg Oral Daily   traZODone  50 mg Oral Once   Continuous Infusions:  vancomycin 1,000 mg (09/05/21 1013)     LOS: 5 days    Time spent: 30 minutes    Barb Merino, MD [facility] Pager [phone]

## 2021-09-07 NOTE — Consult Note (Signed)
ORTHOPAEDIC CONSULTATION  REQUESTING PHYSICIAN: Barb Merino, MD  Chief Complaint: Left foot chronic ulceration with acute infection  HPI: [name] is a 65 y.o. male who presents with uncontrolled type 2 diabetes with end-stage renal disease on dialysis status post a right transtibial amputation who is undergoing a ray amputation of the left foot.  Patient has had a chronic lateral left foot ulcer which was slowly improving during routine office care however patient did have an acute episode of infection and MRI scan shows bony changes consistent with osteomyelitis.  Past Medical History:  Diagnosis Date   Carotid artery occlusion 11/10/10   LEFT CAROTID ENDARTERECTOMY   Chronic kidney disease    Complication of anesthesia    BP WENT UP AT [facility] "   COPD (chronic obstructive pulmonary disease) ([place])    pt denies this dx as of 06/01/20 - no inhaler    Diabetes mellitus without complication ([place])    Diverticulitis    Diverticulosis of colon (without mention of hemorrhage)    DJD (degenerative joint disease)    knees/hands/feet/back/neck   Fatty liver    Full dentures    GERD (gastroesophageal reflux disease)    H/O hiatal hernia    History of blood transfusion    with a past surical procedure per patient 06/01/20   Hyperlipidemia    Hypertension    Neuromuscular disorder ([place])    peripheral neuropathy   Non-pressure chronic ulcer of other part of left foot limited to breakdown of skin ([place]) 11/12/2016   Osteomyelitis (HCC)    left 5th metatarsal   PAD (peripheral artery disease) ([place])    Distal aortogram June 2012. Atherectomy left popliteal artery July 2012.    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   Unstable angina ([place]) 09/16/2018   Wears glasses    Past Surgical History:  Procedure Laterality Date   ABDOMINAL AORTOGRAM W/LOWER EXTREMITY N/A  06/23/2021   Procedure: ABDOMINAL AORTOGRAM W/LOWER EXTREMITY;  Surgeon: Nigel Mormon, MD;  Location: [facility];  Service: Cardiovascular;  Laterality: N/A;   AMPUTATION  11/05/2011   Procedure: AMPUTATION RAY;  Surgeon: Wylene Simmer, MD;  Location: [facility];  Service: Orthopedics;  Laterality: Right;  Amputation of Right 4&5th Toes   AMPUTATION Left 11/26/2012   Procedure: AMPUTATION RAY;  Surgeon: Wylene Simmer, MD;  Location: [facility];  Service: Orthopedics;  Laterality: Left;  fourth ray amputation   AMPUTATION Right 08/27/2014   Procedure: Transmetatarsal Amputation;  Surgeon: Newt Minion, MD;  Location: [place] [facility];  Service: Orthopedics;  Laterality: Right;   AMPUTATION Right 01/14/2015   Procedure: AMPUTATION BELOW KNEE;  Surgeon: Newt Minion, MD;  Location: [place];  Service: Orthopedics;  Laterality: Right;   AMPUTATION Left 10/21/2015   Procedure: Left Foot 5th Ray Amputation;  Surgeon: Newt Minion, MD;  Location: [facility];  Service: Orthopedics;  Laterality: Left;   ANTERIOR FUSION CERVICAL SPINE  02/06/06   C4-5, C5-6, C6-7; SURGEON DR. MAX COHEN   AV FISTULA PLACEMENT Left 06/02/2020   Procedure: ARTERIOVENOUS (AV) FISTULA CREATION LEFT;  Surgeon: Waynetta Sandy, MD;  Location: [place];  Service: Vascular;  Laterality: Left;   BACK SURGERY     x 3   [place] Left 07/21/2020   Procedure: LEFT UPPER ARM ATERIOVENOUS [place];  Surgeon: Waynetta Sandy, MD;  Location: [facility];  Service: Vascular;  Laterality: Left;   BELOW KNEE LEG AMPUTATION Right    CARDIAC CATHETERIZATION  10/31/04   2009   CAROTID ENDARTERECTOMY  11/10/10   CAROTID ENDARTERECTOMY Left 11/10/2010   Subtotal occlusion of left internal carotid artery with left hemispheric transient ischemic attacks.   CAROTID STENT     CARPAL TUNNEL RELEASE Right 10/21/2013   Procedure: RIGHT CARPAL TUNNEL RELEASE;  Surgeon: Wynonia Sours, MD;  Location: [facility];  Service:  Orthopedics;  Laterality: Right;   CHOLECYSTECTOMY     COLON SURGERY     COLONOSCOPY     COLOSTOMY REVERSAL  05/21/2018   ileostomy reversal   CYSTOSCOPY WITH STENT PLACEMENT Bilateral 01/13/2018   Procedure: CYSTOSCOPY WITH BILATERAL URETERAL CATHETER PLACEMENT;  Surgeon: Ardis Hughs, MD;  Location: [facility];  Service: Urology;  Laterality: Bilateral;   ESOPHAGEAL MANOMETRY Bilateral 07/19/2014   Procedure: ESOPHAGEAL MANOMETRY (EM);  Surgeon: Jerene Bears, MD;  [facility];  Service: Gastroenterology;  Laterality: Bilateral;   EYE SURGERY Bilateral 2020   cataract   FEMORAL ARTERY STENT     x6   FINGER SURGERY     FOOT SURGERY  04/25/2016    EXCISION BASE 5TH METATARSAL AND PARTIAL CUBOID LEFT FOOT   HERNIA REPAIR     LEFT INGUINAL AND UMBILICAL REPAIRS   HERNIA REPAIR     I & D EXTREMITY Left 04/25/2016   Procedure: EXCISION BASE 5TH METATARSAL AND PARTIAL CUBOID LEFT FOOT;  Surgeon: Newt Minion, MD;  Location: [facility];  Service: Orthopedics;  Laterality: Left;   ILEOSTOMY  01/13/2018   Procedure: ILEOSTOMY;  Surgeon: Clovis Riley, MD;  [facility];  Service: General;;   ILEOSTOMY CLOSURE N/A 05/21/2018   Procedure: ILEOSTOMY REVERSAL ERAS PATHWAY;  Surgeon: Clovis Riley, MD;  Location: [facility];  Service: General;  Laterality: N/A;   IR RADIOLOGIST EVAL & MGMT  11/19/2017   IR RADIOLOGIST EVAL & MGMT  12/03/2017   IR RADIOLOGIST EVAL & MGMT  12/18/2017   JOINT REPLACEMENT Right 2001   Total knee   LAMINECTOMY     X 3 LUMBAR AND X 2 CERVICAL SPINE OPERATIONS   LAPAROSCOPIC CHOLECYSTECTOMY W/ CHOLANGIOGRAPHY  11/09/04   SURGEON DR. Bay CATH AND CORONARY ANGIOGRAPHY N/A 09/16/2018   Procedure: LEFT HEART CATH AND CORONARY ANGIOGRAPHY;  Surgeon: Nigel Mormon, MD;  Location: [facility];  Service: Cardiovascular;  Laterality: N/A;   LEFT HEART CATHETERIZATION WITH CORONARY ANGIOGRAM N/A 10/29/2014   Procedure: LEFT HEART  CATHETERIZATION WITH CORONARY ANGIOGRAM;  Surgeon: Laverda Page, MD;  Location: [facility];  Service: Cardiovascular;  Laterality: N/A;   LIGATION OF COMPETING BRANCHES OF ARTERIOVENOUS FISTULA Left 07/21/2020   Procedure: LIGATION OF COMPETING BRANCHES OF LEFT UPPER ARM ARTERIOVENOUS FISTULA;  Surgeon: Waynetta Sandy, MD;  Location: [place];  Service: Vascular;  Laterality: Left;   LOWER EXTREMITY ANGIOGRAM N/A 03/19/2012   Procedure: LOWER EXTREMITY ANGIOGRAM;  Surgeon: Burnell Blanks, MD;  Location: [facility];  Service: Cardiovascular;  Laterality: N/A;   LOWER EXTREMITY ANGIOGRAPHY N/A 06/20/2021   Procedure: LOWER EXTREMITY ANGIOGRAPHY;  Surgeon: Nigel Mormon, MD;  Location: [facility];  Service: Cardiovascular;  Laterality: N/A;   NECK SURGERY     PARTIAL COLECTOMY N/A 01/13/2018   Procedure: LAPAROSCOPIC ASSISTED   SIGMOID COLECTOMY ILEOSTOMY;  Surgeon: Clovis Riley, MD;  Location[facility];  Service: General;  Laterality: N/A;   PENILE PROSTHESIS IMPLANT  08/14/05   INFRAPUBIC INSERTION OF INFLATABLE PENILE PROSTHESIS; SURGEON DR. Amalia Hailey   PENILE PROSTHESIS IMPLANT     PERCUTANEOUS CORONARY STENT INTERVENTION (PCI-S) Right 10/29/2014   Procedure: PERCUTANEOUS CORONARY STENT INTERVENTION (PCI-S);  Surgeon: Laverda Page, MD;  Location: [facility];  Service: Cardiovascular;  Laterality: Right;   PERIPHERAL VASCULAR INTERVENTION Left 06/23/2021   Procedure: PERIPHERAL VASCULAR INTERVENTION;  Surgeon: Nigel Mormon, MD;  Location: [facility];  Service: Cardiovascular;  Laterality: Left;   REMOVAL OF PENILE PROSTHESIS N/A 06/14/2021   Procedure: Removal of THREE piece inflatable penile prosthesis;  Surgeon: Lucas Mallow, MD;  Location: [facility];  Service: Urology;  Laterality: N/A;   SHOULDER ARTHROSCOPY     SPINE SURGERY     TOE AMPUTATION Left    TONSILLECTOMY     TOTAL KNEE ARTHROPLASTY  07/2002   RIGHT KNEE ; SURGEON  DR.  [name] ALSO HAD ARTHROSCOPIC RIGHT KNEE IN  10/2001   TOTAL KNEE ARTHROPLASTY     ULNAR NERVE TRANSPOSITION Right 10/21/2013   Procedure: RIGHT ELBOW  ULNAR NERVE DECOMPRESSION;  Surgeon: Wynonia Sours, MD;  Location: [place];  Service: Orthopedics;  Laterality: Right;   Social History   Socioeconomic History   Marital status: Married    Spouse name: Not on file   Number of children: 3   Years of education: Not on file   Highest education level: Not on file  Occupational History   Occupation: [employer]    Employer: UNEMPLOYED  Tobacco Use   Smoking status: Former    Packs/day: 2.00    Years: 35.00    Pack years: 70.00    Types: Cigarettes    Quit date: 10/28/2011    Years since quitting: 9.8   Smokeless tobacco: Never  Vaping Use   Vaping Use: Some days   Substances: Nicotine  Substance and Sexual Activity   Alcohol use: Not Currently    Comment: "not in a long time"   Drug use: Never   Sexual activity: Yes    Birth control/protection: Implant    Comment: penile implant  Other Topics Concern   Not on file  Social History Narrative   ** Merged History Encounter **       HEAVY SMOKER AND CONTINUES TO SMOKE 1 PPD. DOES NOT EXERCISE REGULARLY.  Wife [phone] Lebron Quam). Has 2 sons and daughter. Still active       Social Determinants of Health   Financial Resource Strain: Not on file  Food Insecurity: Not on file  Transportation Needs: Not on file  Physical Activity: Not on file  Stress: Not on file  Social Connections: Not on file   Family History  Problem Relation Age of Onset   Heart disease Father        Before age 24-  CAD, BPG   Diabetes Father        Amputation   Cancer Father        PROSTATE   Hyperlipidemia Father    Hypertension Father    Heart attack Father        Triple BPG   Varicose Veins Father    Colon cancer Brother    Diabetes Brother    Heart disease Brother 71       A-Fib. Before age 90   Hyperlipidemia  Brother  Hypertension Brother    Cancer Sister        Breast   Hyperlipidemia Sister    Hypertension Sister    Hypertension Son    Arthritis Other        GRANDMOTHER   Hypertension Other        OTHER FAMILY MEMBERS   - negative except otherwise stated in the family history section Allergies  Allergen Reactions   Contrast Media [Iodinated Contrast Media] Shortness Of Breath and Other (See Comments)    Difficulty breathing and altered mental status     Ivp Dye [Iodinated Contrast Media] Anaphylaxis, Shortness Of Breath and Other (See Comments)    Breathing problems, altered mental state    Adhesive [Tape] Rash and Other (See Comments)    Rash after 1 day of use   Latex Rash and Other (See Comments)    A severe rash appears after the first 24 hours of being placed   Prior to Admission medications   Medication Sig Start Date End Date Taking? Authorizing Provider  amLODipine (NORVASC) 5 MG tablet Take 5 mg by mouth daily. 01/30/21  Yes [provider]  apixaban (ELIQUIS) 5 MG TABS tablet Take 5 mg by mouth See admin instructions. Take 5 mg by mouth two times a day only on Sun/Mon/Wed/Fri non-dialysis days   Yes [provider]  atorvastatin (LIPITOR) 40 MG tablet Take 40 mg by mouth at bedtime.    Yes [provider]  doxycycline (VIBRA-TABS) 100 MG tablet Take 1 tablet (100 mg total) by mouth 2 (two) times daily. Patient taking differently: Take 100 mg by mouth See admin instructions. Bid x 30 days 07/26/21  Yes Dondra Prader R, NP  famotidine (PEPCID) 40 MG tablet Take 40 mg by mouth daily. 04/29/21  Yes [provider]  folic acid-vitamin b complex-vitamin c-selenium-zinc (DIALYVITE) 3 MG TABS tablet Take 1 tablet by mouth daily.   Yes [provider]  Glucosamine HCl (GLUCOSAMINE PO) Take 1 tablet by mouth 2 (two) times daily.   Yes [provider]  insulin glargine (LANTUS) 100 UNIT/ML injection Inject 0.1 mLs (10 Units total)  into the skin at bedtime. 06/30/21  Yes Antonieta Pert, MD  ketoconazole (NIZORAL) 2 % cream Apply 1 application topically 2 (two) times daily as needed for irritation.  03/07/18  Yes [provider]  lidocaine-prilocaine (EMLA) cream Apply 1 application topically See admin instructions. Prior to Dialysis days Tuesday,Thursday and saturday 06/06/21  Yes [provider]  linaclotide (LINZESS) 145 MCG CAPS capsule Take 145 mcg by mouth daily as needed (constipation).    Yes [provider]  loratadine (CLARITIN) 10 MG tablet Take 10 mg by mouth daily.   Yes [provider]  NEEDLE, REUSABLE, 22 G 22G X 1-1/2" MISC 1 Units by Does not apply route as directed. For B12 IM inj 02/15/18  Yes Elgergawy, Silver Huguenin, MD  nitroGLYCERIN (NITRODUR - DOSED IN MG/24 HR) 0.2 mg/hr patch Place 1 patch (0.2 mg total) onto the skin daily. 06/12/21  Yes Newt Minion, MD  omega-3 acid ethyl esters (LOVAZA) 1 g capsule Take 2 g by mouth 2 (two) times daily.   Yes [provider]  ondansetron (ZOFRAN) 4 MG tablet Take 4 mg by mouth every 6 (six) hours as needed for nausea or vomiting.   Yes [provider]  pantoprazole (PROTONIX) 40 MG tablet Take 40 mg by mouth daily.   Yes [provider]  Polyvinyl Alcohol-Povidone PF 1.4-0.6 %  SOLN Place 1 drop into both eyes 2 (two) times daily as needed (dry eyes).    Yes [provider]  pregabalin (LYRICA) 75 MG capsule Take 1 capsule (75 mg total) by mouth daily for 14 days. Patient taking differently: Take 75 mg by mouth at bedtime. 06/30/21 10/03/21 Yes Antonieta Pert, MD  rOPINIRole (REQUIP) 2 MG tablet Take 2 mg by mouth at bedtime.   Yes [provider]  Syringe/Needle, Disp, (SYRINGE 3CC/22GX1-1/2") 22G X 1-1/2" 3 ML MISC 1 Syringe by Does not apply route as directed. For b12 IM inj 02/15/18  Yes Elgergawy, Silver Huguenin, MD  tamsulosin (FLOMAX) 0.4 MG CAPS capsule Take 0.4 mg by mouth daily.   Yes [provider]  amiodarone (PACERONE) 200 MG tablet Take 1 tablet (200 mg total) by mouth daily. Two (2) tablets for 10 days then one tab daily 09/06/21   Adrian Prows, MD  apixaban (ELIQUIS) 5 MG TABS tablet Take 1 tablet (5 mg total) by mouth 2 (two) times daily. 09/06/21   Barb Merino, MD  famotidine (PEPCID) 20 MG tablet Take 1 tablet (20 mg total) by mouth daily. 09/05/21   Sharen Hones, MD  fentaNYL (DURAGESIC) 50 MCG/HR Place 1 patch onto the skin every 3 (three) days. 09/05/21   Sharen Hones, MD  HUMALOG KWIKPEN 100 UNIT/ML KwikPen Inject 4 Units into the skin 3 (three) times daily. 09/05/21   Sharen Hones, MD  midodrine (PROAMATINE) 10 MG tablet Take 1 tablet (10 mg total) by mouth every other day. One hour before dialysis 09/07/21   Adrian Prows, MD  oxyCODONE-acetaminophen (PERCOCET) 7.5-325 MG tablet Take 1 tablet by mouth 3 (three) times daily as needed for moderate pain. 09/05/21   Sharen Hones, MD  vancomycin (VANCOCIN) 1-5 GM/200ML-% SOLN Inject 200 mLs (1,000 mg total) into the vein Every Tuesday,Thursday,and Saturday with dialysis. 09/07/21 10/14/21  Sharen Hones, MD   DG CHEST PORT 1 VIEW  Result Date: 09/05/2021 CLINICAL DATA:  Hypoxia EXAM: PORTABLE CHEST 1 VIEW COMPARISON:  09/02/2021 FINDINGS: Transverse diameter of heart is slightly increased. There are no signs of alveolar pulmonary edema or new focal pulmonary consolidation. Left hemidiaphragm is elevated. There is crowding of markings in the left lower lung fields. There is improvement in aeration of right lower lung fields. There is no pleural effusion or pneumothorax. IMPRESSION: Increased markings in the left lower lung fields may be due to crowding of normal bronchovascular structures caused by elevation of left hemidiaphragm or subsegmental atelectasis. There is no focal pulmonary consolidation. There is no pleural effusion or pneumothorax. Electronically Signed   By: Elmer Picker M.D.   On: 09/05/2021 14:00   -  pertinent xrays, CT, MRI studies were reviewed and independently interpreted  Positive ROS: All other systems have been reviewed and were otherwise negative with the exception of those mentioned in the HPI and as above.  Physical Exam: General: Alert, no acute distress Psychiatric: Patient is competent for consent with normal mood and affect Lymphatic: No axillary or cervical lymphadenopathy Cardiovascular: No pedal edema Respiratory: No cyanosis, no use of accessory musculature GI: No organomegaly, abdomen is soft and non-tender    Images:  @ENCIMAGES @  Labs:  Lab Results  Component Value Date   HGBA1C 7.9 (H) 06/14/2021   HGBA1C 5.7 (H) 09/16/2018   HGBA1C 7.7 (H) 05/15/2018   ESRSEDRATE 40 (H) 09/03/2021   ESRSEDRATE 17 (H) 06/24/2021   ESRSEDRATE 35 (H) 09/03/2016   CRP 2.7 (H) 09/07/2021   CRP 6.4 (  H) 09/06/2021   CRP 8.4 (H) 09/05/2021   REPTSTATUS PENDING 09/02/2021   GRAMSTAIN  06/14/2021    NO ORGANISMS SEEN SQUAMOUS EPITHELIAL CELLS PRESENT ABUNDANT WBC PRESENT,BOTH PMN AND MONONUCLEAR MODERATE GRAM POSITIVE COCCI    CULT  09/02/2021    NO GROWTH 4 DAYS Performed at [facility], [place] [address], [place], [place] [address]    LABORGA ENTEROCOCCUS FAECALIS 06/14/2021    Lab Results  Component Value Date   ALBUMIN 3.0 (L) 09/07/2021   ALBUMIN 3.1 (L) 09/06/2021   ALBUMIN 2.7 (L) 09/05/2021   PREALBUMIN 19.0 05/04/2016   PREALBUMIN 25.0 12/16/2015     CBC EXTENDED Latest Ref Rng & Units 09/07/2021 09/06/2021 09/05/2021  WBC 4.0 - 10.5 K/uL 4.8 2.3(L) 3.8(L)  RBC 4.22 - 5.81 MIL/uL 3.72(L) 3.96(L) 3.35(L)  HGB 13.0 - 17.0 g/dL 11.1(L) 11.9(L) 10.2(L)  HCT 39.0 - 52.0 % 33.0(L) 34.9(L) 30.1(L)  PLT 150 - 400 K/uL 151 117(L) 103(L)  NEUTROABS 1.7 - 7.7 K/uL 3.6 1.9 2.1  LYMPHSABS 0.7 - 4.0 K/uL 0.8 0.3(L) 1.1    Neurologic: Patient does not have protective sensation bilateral lower extremities.   MUSCULOSKELETAL:   Skin: Examination there  is no cellulitis no dermatitis lateral left foot with a much smaller ulcer base of the lateral foot.  Patient's venous stasis swelling in the left lower extremity is significantly decreased.  Review of the MRI scan shows bony edema involving the base of the fourth metatarsal lateral cuneiform and cuboid.  Review of the CT scan shows extensive arterial calcification in the femoral arteries bilaterally.  Review of the patient's most recent ankle-brachial indices shows a great toe pressure of 79 with calcified vessels.  Assessment: Assessment: Diabetic insensate neuropathy end-stage renal disease on dialysis with history of tobacco use with osteomyelitis lateral left foot.  Plan: Plan: I agree with pursuing IV antibiotics after dialysis.  Patient's foot is the best that its looked in several months.  Patients surgical option would be a transtibial amputation.  With bilateral transtibial amputations and patient's multiple medical problems his ability to ambulate would be extremely limited after surgery.  I will continue to follow-up in the office after discharge.  Thank you for the consult and the opportunity to see Mr. Joycie Peek, MD [facility] [phone] 7:38 AM

## 2021-09-07 NOTE — Progress Notes (Signed)
Pt came back to rm 19 from dialysis. VSS. Went over AVS with pt and all questions were answered,   Lavenia Atlas, RN

## 2021-09-07 NOTE — Progress Notes (Incomplete)
Inpatient Diabetes Program Recommendations  AACE/ADA: New Consensus Statement on Inpatient Glycemic Control (2015)  Target Ranges:  Prepandial:   less than 140 mg/dL      Peak postprandial:   less than 180 mg/dL (1-2 hours)      Critically ill patients:  140 - 180 mg/dL   Lab Results  Component Value Date   GLUCAP 308 (H) 09/07/2021   HGBA1C 7.9 (H) 06/14/2021    Review of Glycemic Control  Latest Reference Range & Units 09/06/21 16:27 09/06/21 21:56 09/07/21 02:13 09/07/21 06:34 09/07/21 08:52  Glucose-Capillary 70 - 99 mg/dL 369 (H) 542 (HH) 369 (H) 348 (H) 308 (H)  (HH): Data is critically high (H): Data is abnormally high Diabetes history: Type 2 DM Outpatient Diabetes medications: none Current orders for Inpatient glycemic control: Semglee 10 units QHS, Novolog 0-6 units TID  Inpatient Diabetes Program Recommendations:

## 2021-09-07 NOTE — Progress Notes (Signed)
Subjective:  Feels much better.  Intake/Output from previous day:  I/O last 3 completed shifts: In: 40 [P.O.:257] Out: 575 [Urine:575] No intake/output data recorded.  Blood pressure (!) 160/60, pulse 74, temperature 98.4 F (36.9 C), temperature source Oral, resp. rate 18, height 6' 2"  (1.88 m), weight 107.4 kg, SpO2 97 %.  Vitals:   09/06/21 2011 09/07/21 0344  BP: (!) 174/76 (!) 160/60  Pulse: 97 74  Resp: 17 18  Temp: 98.6 F (37 C) 98.4 F (36.9 C)  SpO2: 97% 97%  Body mass index is 30.4 kg/m.   Physical Exam Vitals reviewed.  Constitutional:      General: He is not in acute distress.    Appearance: He is obese.     Comments: Well built and moderately obese in no acute distress  Neck:     Vascular: Carotid bruit (bilateral) present. No JVD.  Cardiovascular:     Rate and Rhythm: Normal rate and regular rhythm.     Pulses: Intact distal pulses.          Femoral pulses are 1+ on the right side with bruit and 1+ on the left side with bruit.      Popliteal pulses are 0 on the right side and 0 on the left side.       Dorsalis pedis pulses are 0 on the left side. Right dorsalis pedis pulse not accessible.       Posterior tibial pulses are 0 on the left side. Right posterior tibial pulse not accessible.     Heart sounds: Normal heart sounds. No murmur heard.   No gallop.     Comments: Right BKA. Left brachial artery AV Shunt noted Pulmonary:     Effort: Pulmonary effort is normal.     Breath sounds: Normal breath sounds.  Abdominal:     General: Abdomen is flat. Bowel sounds are normal.     Palpations: Abdomen is soft.  Musculoskeletal:        General: Deformity (Right BKA) present.     Cervical back: Neck supple.     Left lower leg: Edema (Chronic ulceration noted,  shrunken skin, wound in dressing not exposed by me today.  No edema.) present.  Neurological:     General: No focal deficit present.     Mental Status: He is oriented to person, place, and time.     Lab Results: BMP BNP (last 3 results) No results for input(s): BNP in the last 8760 hours.  ProBNP (last 3 results) No results for input(s): PROBNP in the last 8760 hours. BMP Latest Ref Rng & Units 09/07/2021 09/06/2021 09/05/2021  Glucose 70 - 99 mg/dL 335(H) 417(H) 129(H)  BUN 8 - 23 mg/dL 44(H) 30(H) 43(H)  Creatinine 0.61 - 1.24 mg/dL 5.82(H) 4.85(H) 5.74(H)  BUN/Creat Ratio 10 - 24 - - -  Sodium 135 - 145 mmol/L 133(L) 133(L) 135  Potassium 3.5 - 5.1 mmol/L 3.8 4.0 3.4(L)  Chloride 98 - 111 mmol/L 98 96(L) 100  CO2 22 - 32 mmol/L 22 23 21(L)  Calcium 8.9 - 10.3 mg/dL 8.6(L) 8.4(L) 7.9(L)   Hepatic Function Latest Ref Rng & Units 09/07/2021 09/06/2021 09/05/2021  Total Protein 6.5 - 8.1 g/dL 5.9(L) 5.9(L) 5.1(L)  Albumin 3.5 - 5.0 g/dL 3.0(L) 3.1(L) 2.7(L)  AST 15 - 41 U/L 16 18 17   ALT 0 - 44 U/L 14 13 9   Alk Phosphatase 38 - 126 U/L 96 98 89  Total Bilirubin 0.3 - 1.2 mg/dL 0.8 0.6 0.8  Bilirubin, Direct 0.0 - 0.3 mg/dL - - -   CBC Latest Ref Rng & Units 09/07/2021 09/06/2021 09/05/2021  WBC 4.0 - 10.5 K/uL 4.8 2.3(L) 3.8(L)  Hemoglobin 13.0 - 17.0 g/dL 11.1(L) 11.9(L) 10.2(L)  Hematocrit 39.0 - 52.0 % 33.0(L) 34.9(L) 30.1(L)  Platelets 150 - 400 K/uL 151 117(L) 103(L)   Lipid Panel     Component Value Date/Time   CHOL 117 05/30/2020 1646   TRIG 133 05/30/2020 1646   HDL 24 (L) 05/30/2020 1646   LDLCALC 69 05/30/2020 1646   Cardiac Panel (last 3 results) No results for input(s): CKTOTAL, CKMB, TROPONINI, RELINDX in the last 72 hours.  HEMOGLOBIN A1C Lab Results  Component Value Date   HGBA1C 7.9 (H) 06/14/2021   MPG 180.03 06/14/2021   TSH No results for input(s): TSH in the last 8760 hours. Imaging: Imaging results have been reviewed  Cardiac Studies:  Echocardiogram 06/17/2021: 1. Left ventricular ejection fraction, by estimation, is 55 to 60%. The left ventricle has normal function. The left ventricle has no regional wall motion abnormalities.  There is mild concentric left ventricular hypertrophy. Left ventricular diastolic  parameters are indeterminate.  2. Right ventricular systolic function is normal. The right ventricular size is normal.  3. Left atrial size was moderately dilated.  4. The mitral valve is normal in structure. No evidence of mitral valve regurgitation. Moderate mitral annular calcification.  5. The aortic valve is tricuspid. Aortic valve regurgitation is not visualized. Mild to moderate aortic valve sclerosis/calcification is present, without any evidence of aortic stenosis.  EKG:  EKG 09/06/2021: Sinus rhythm with borderline first-degree AV block at rate of 104 bpm, left axis deviation, left anterior fascicular block.  Right bundle branch block.  Trifascicular block.  Minimal criteria by voltage for LVH.   EKG 06/13/2021: Atypical atrial flutter with RVR at rate of 126 bpm, left axis deviation, left anterior fascicular block, right bundle branch block.  Minimal criteria for LVH in aVL.   Telemetry 09/06/2021: A. fib with RVR.  Telemetry 09/07/2021: Normal sinus rhythm.  Scheduled Meds:  amiodarone  200 mg Oral BID   apixaban  5 mg Oral BID   atorvastatin  40 mg Oral QHS   darbepoetin (ARANESP) injection - DIALYSIS  60 mcg Intravenous Q Tue-HD   famotidine  20 mg Oral Daily   fentaNYL  1 patch Transdermal Q72H   insulin aspart  0-6 Units Subcutaneous TID AC & HS   insulin glargine-yfgn  10 Units Subcutaneous QHS   loratadine  10 mg Oral Daily   midodrine  10 mg Oral QODAY   nitroGLYCERIN  1 inch Topical Q6H   omega-3 acid ethyl esters  2 g Oral BID   pantoprazole  40 mg Oral Daily   pregabalin  75 mg Oral BID   rOPINIRole  2 mg Oral QHS   tamsulosin  0.4 mg Oral Daily   traZODone  50 mg Oral Once   Continuous Infusions:  vancomycin 1,000 mg (09/05/21 1013)   PRN Meds:.acetaminophen, hydrALAZINE, linaclotide, ondansetron (ZOFRAN) IV, oxyCODONE-acetaminophen  Assessment/Plan:  1.  Atrial  fibrillation with rapid ventricular response, paroxysmal.  He is now back in sinus rhythm, was started on amiodarone 200 mg p.o. twice daily for 10 days followed by 20 mg daily. 2.  Syncope during dialysis, related to relative hypotension.  I have started him on midodrine 1 hour prior to dialysis.  Hopefully this will help, patient has altered mental status when systolic blood pressure drops <120 mmHg. 3.  Systemic hypertension, patient previously on amlodipine, was on multiple medications prior with uncontrolled hypertension however with dialysis blood pressure has normalized.  He is also septic and hence all his antihypertensive medications were held.  Restart amlodipine 5 mg daily.  We will administer this in the evening to see how he responds during dialysis first. 4.  Peripheral arterial disease with critical limb ischemia left leg stable.  Stable cardiac status and I have made an appointment to see me on 10/06/2021.   Adrian Prows, MD, [facility] 09/07/2021, 8:45 AM Office: [phone] Fax: [phone] Pager: [phone]

## 2021-09-08 ENCOUNTER — Ambulatory Visit: Payer: 59 | Admitting: Family

## 2021-09-09 ENCOUNTER — Encounter: Payer: Self-pay | Admitting: Physician Assistant

## 2021-09-09 ENCOUNTER — Telehealth: Payer: Self-pay | Admitting: Physician Assistant

## 2021-09-09 NOTE — Telephone Encounter (Signed)
Transition of care contact from inpatient facility  Date of Discharge:  Date of Contact: Method of contact: Phone  Attempted to contact patient to discuss transition of care from inpatient admission. Patient did not answer the phone. Message was left on the patient's voicemail with call back instructions.  Anice Paganini, PA-C 09/09/2021, 11:16 AM  [facility] Pager: [phone]

## 2021-09-13 ENCOUNTER — Ambulatory Visit: Payer: 59 | Admitting: Family

## 2021-09-15 ENCOUNTER — Other Ambulatory Visit: Payer: Self-pay

## 2021-09-15 ENCOUNTER — Ambulatory Visit (INDEPENDENT_AMBULATORY_CARE_PROVIDER_SITE_OTHER): Payer: 59 | Admitting: Family

## 2021-09-15 ENCOUNTER — Other Ambulatory Visit: Payer: Medicare Other | Admitting: Student

## 2021-09-15 DIAGNOSIS — I87332 Chronic venous hypertension (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: Secondary | ICD-10-CM | POA: Diagnosis not present

## 2021-09-15 DIAGNOSIS — L97521 Non-pressure chronic ulcer of other part of left foot limited to breakdown of skin: Secondary | ICD-10-CM | POA: Diagnosis not present

## 2021-09-15 DIAGNOSIS — L97929 Non-pressure chronic ulcer of unspecified part of left lower leg with unspecified severity: Secondary | ICD-10-CM

## 2021-09-15 DIAGNOSIS — Z515 Encounter for palliative care: Secondary | ICD-10-CM

## 2021-09-15 DIAGNOSIS — R52 Pain, unspecified: Secondary | ICD-10-CM

## 2021-09-15 DIAGNOSIS — N186 End stage renal disease: Secondary | ICD-10-CM

## 2021-09-15 DIAGNOSIS — M86172 Other acute osteomyelitis, left ankle and foot: Secondary | ICD-10-CM

## 2021-09-15 NOTE — Progress Notes (Signed)
[facility] Consult Note Telephone: [phone]  Fax: [phone]    Date of encounter: 09/15/21 4:29 PM PATIENT NAME: [name] [address] [place] [address]   [phone] (home)  DOB: 10-Dec-1955 MRN: [id number] PRIMARY CARE PROVIDER:    Fae [name],  [place] [address] [phone]  REFERRING PROVIDER:   [name], PA-C [place],  [place] [address] [phone]  RESPONSIBLE PARTY:    Contact Information     Name Relation Home Work Mobile   [name] [place] Spouse  [phone]   [name], [place]   [phone]       Due to the COVID-19 crisis, this visit was done via telemedicine from my office and it was initiated and consent by this patient and or family.  I connected with  [place] [facility] on 09/15/2021 by a video enabled telemedicine application and verified that I am speaking with the correct person using two identifiers.   I discussed the limitations of evaluation and management by telemedicine. The patient expressed understanding and agreed to proceed.                                    ASSESSMENT AND PLAN / RECOMMENDATIONS:   Advance Care Planning/Goals of Care: Goals include to maximize quality of life and symptom management. Patient/health care surrogate gave his/her permission to discuss. Our advance care planning conversation included a discussion about:    The value and importance of advance care planning  Experiences with loved ones who have been seriously ill or have died  Exploration of personal, cultural or spiritual beliefs that might influence medical decisions  Exploration of goals of care in the event of a sudden injury or illness  CODE STATUS: Full Code   Symptom Management/Plan:  ESRD-continue HD on Tuesday/Thursday/Saturday as directed.   Pain-chronic pain; pain managed with current regimen. Continue fentanyl 50  mcg every 72 hours, percocet TID PRN, Lyrica as directed. Palliative Medicine will refill Duragesic and percocet; scripts to be sent to [facility] on [address]  Chronic left foot vascular wound-acute osteomyelitis. Continue daily wound care as directed; follow up at wound center twice weekly. Continue Vancomycin 6 weeks; administered on dialysis days.    Follow up Palliative Care Visit: Palliative care will continue to follow for complex medical decision making, advance care planning, and clarification of goals. Return in 4 weeks or prn.   This visit was coded based on medical decision making (MDM).  PPS: 40%  HOSPICE ELIGIBILITY/DIAGNOSIS: TBD  Chief Complaint: Palliative Medicine follow up visit.   HISTORY OF PRESENT ILLNESS:  [name] is a 66 y.o. 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 Patient recently hospitalized 12/24-12/27/2022 due to sepsis, Covid-19 pneumonia, acute osteomyelitis of left foot, acute metabolic encephalopathy.   Patient states he is tolerating HD; midodrine recently started due to soft blood pressures. He is currently receiving Vancomycin on his HD days. He states his pain is managed with current regimen of Duragesic 50 mcg, percocet TID PRN, Lyrica 100 mg daily. 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 He is able to stand and pivot for transfers.  He endorses a good appetite; weight is stable. He is currently going to wound center twice a week now due to chronic left foot vascular wound. Wife states buttocks wound has improved. He is sleeping well. A 10-point review of systems is negative, except for the pertinent positives and negatives detailed in the HPI.   History obtained from  review of EMR, discussion with primary team, and interview with family, facility staff/caregiver and/or [name].  I reviewed available labs, medications, imaging, studies and related documents from the EMR.  Records reviewed and summarized above.    Physical Exam: Weight 236 pounds Constitutional: NAD General: frail appearing, WNWD EYES: anicteric sclera, lids intact, no discharge  ENMT: intact hearing CV: S1S2, RRR, no LE edema Pulmonary:  no increased work of breathing, no cough, room air GU: deferred MSK: non-ambulatory Skin: warm and dry, no rashes on visible skin; wounds not visualized Neuro: generalized weakness, A & O x 3 Psych: non-anxious affect, pleasant Hem/lymph/immuno: no widespread bruising   Thank you for the opportunity to participate in the care of [name].  The palliative care team will continue to follow. Please call our office at [phone] if we can be of additional assistance.   Ezekiel Slocumb, NP   COVID-19 PATIENT SCREENING TOOL Asked and negative response unless otherwise noted:   Have you had symptoms of covid, tested positive or been in contact with someone with symptoms/positive test in the past 5-10 days? No

## 2021-09-18 ENCOUNTER — Ambulatory Visit: Payer: 59 | Admitting: Orthopedic Surgery

## 2021-09-19 ENCOUNTER — Encounter: Payer: Self-pay | Admitting: Family

## 2021-09-19 NOTE — Progress Notes (Signed)
Office Visit Note   Patient: [name]           Date of Birth: April 17, 1956           MRN: [id number] Visit Date: 09/15/2021              Requested by: [name], PA-C [address] [place],  [place] [address] PCP: [name], PA-C  Chief Complaint  Patient presents with   Left Leg - Follow-up      HPI: Patient is a 66 year old gentleman who is seen in follow-up for ischemic ulcer and venous insufficiency of the left lower extremity. he is currently undergoing serial compression wraps.  Unfortunately did miss his last visit and had a laparotomy for prolonged period of time he states that he did have drainage through his wrap  Assessment & Plan: Visit Diagnoses:  No diagnosis found.   Plan: Continue with the silver cell in the wound.  Compression wrap on the left. follow-up wound care and rewrap early next week. Follow-Up Instructions: Return in about 10 days (around 09/25/2021).   Ortho Exam  Patient is alert, oriented, no adenopathy, well-dressed, normal affect, normal respiratory effort. Examination the leg has continued decreased swelling.  No new epithelialization to the wound over the lateral aspect the left foot.  This continues with has fibrinous exudative tissue there is no cellulitis odor or drainage.  The ischemic ulcer is stable.  Imaging: No results found. No images are attached to the encounter.  Labs: Lab Results  Component Value Date   HGBA1C 7.9 (H) 06/14/2021   HGBA1C 5.7 (H) 09/16/2018   HGBA1C 7.7 (H) 05/15/2018   ESRSEDRATE 40 (H) 09/03/2021   ESRSEDRATE 17 (H) 06/24/2021   ESRSEDRATE 35 (H) 09/03/2016   CRP 2.7 (H) 09/07/2021   CRP 6.4 (H) 09/06/2021   CRP 8.4 (H) 09/05/2021   REPTSTATUS 09/07/2021 FINAL 09/02/2021   GRAMSTAIN  06/14/2021    NO ORGANISMS SEEN SQUAMOUS EPITHELIAL CELLS PRESENT ABUNDANT WBC PRESENT,BOTH PMN AND MONONUCLEAR MODERATE GRAM POSITIVE COCCI    CULT  09/02/2021    NO GROWTH 5 DAYS Performed at  [facility], [place] [address], [place], [place] [address]    LABORGA ENTEROCOCCUS FAECALIS 06/14/2021     Lab Results  Component Value Date   ALBUMIN 3.0 (L) 09/07/2021   ALBUMIN 3.1 (L) 09/06/2021   ALBUMIN 2.7 (L) 09/05/2021   PREALBUMIN 19.0 05/04/2016   PREALBUMIN 25.0 12/16/2015    Lab Results  Component Value Date   MG 1.9 05/24/2018   MG 2.1 05/23/2018   MG 1.3 (L) 05/22/2018   No results found for: VD25OH  Lab Results  Component Value Date   PREALBUMIN 19.0 05/04/2016   PREALBUMIN 25.0 12/16/2015   CBC EXTENDED Latest Ref Rng & Units 09/07/2021 09/06/2021 09/05/2021  WBC 4.0 - 10.5 K/uL 4.8 2.3(L) 3.8(L)  RBC 4.22 - 5.81 MIL/uL 3.72(L) 3.96(L) 3.35(L)  HGB 13.0 - 17.0 g/dL 11.1(L) 11.9(L) 10.2(L)  HCT 39.0 - 52.0 % 33.0(L) 34.9(L) 30.1(L)  PLT 150 - 400 K/uL 151 117(L) 103(L)  NEUTROABS 1.7 - 7.7 K/uL 3.6 1.9 2.1  LYMPHSABS 0.7 - 4.0 K/uL 0.8 0.3(L) 1.1     There is no height or weight on file to calculate BMI.  Orders:  No orders of the defined types were placed in this encounter.  No orders of the defined types were placed in this encounter.    Procedures: No procedures performed  Clinical Data: No additional findings.  ROS:  All other systems negative, except as noted in the HPI. Review of Systems  Objective: Vital Signs: There were no vitals taken for this visit.  Specialty Comments:  No specialty comments available.  PMFS History: Patient Active Problem List   Diagnosis Date Noted   Acute metabolic encephalopathy 25/95/6387   COVID 09/02/2021   Sepsis ([place]) 09/02/2021   Severe sepsis with lactic acidosis ([place]) 06/14/2021   Infection associated with implanted penile prosthesis ([place]) 06/14/2021   ESRD on hemodialysis ([place]) 06/14/2021   Paroxysmal atrial fibrillation with RVR ([place]) 06/14/2021   Carotid stenosis 06/15/2020   Dyslipidemia 06/15/2020   Severe nonproliferative diabetic retinopathy of right eye, with macular edema,  associated with type 2 diabetes mellitus ([place]) 01/18/2020   Severe nonproliferative diabetic retinopathy of left eye, with macular edema, associated with type 2 diabetes mellitus ([place]) 01/04/2020   Retinal hemorrhage of right eye 01/04/2020   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   Pseudoclaudication 11/15/2018   Essential hypertension 09/18/2018   S/P BKA (below knee amputation), right ([place]) 09/18/2018   Amputation of toe of left foot ([place]) 09/18/2018   Peripheral artery disease ([place]) 09/18/2018   S/P carotid endarterectomy 09/18/2018   OSA on CPAP 09/18/2018   Chronic back pain 09/18/2018   Status post reversal of ileostomy 05/21/2018   Normocytic anemia 02/14/2018   Intra-abdominal abscess ([place]) 11/04/2017   Chronic venous hypertension (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 ([place]) 12/11/2016   Ischemic ulcer of left foot, limited to breakdown of skin ([place]) 11/12/2016   Unilateral primary osteoarthritis, left knee 10/11/2016   History of right below knee amputation ([place]) 07/12/2016   Acute osteomyelitis of left foot ([place])    Diabetic foot infection ([place]) 05/04/2016   Insulin dependent type 2 diabetes mellitus ([place]) 12/16/2015   Amputated toe ([place]) 10/21/2015   Leg edema, left 09/03/2015   CAD (dz of distal, mid and proximal RCA with implantation of 3 overlapping drug-eluting stent,) 09/03/2015   Chronic diastolic heart failure, NYHA class 2 ([place]) 09/03/2015   Angina pectoris associated with type 2 diabetes mellitus ([place]) 10/28/2014   Hyperlipidemia 08/25/2014   Limb pain 03/20/2013   DJD (degenerative joint disease) 09/25/2012   Migraine 09/25/2012   Neuropathy 09/25/2012   Restless legs syndrome (RLS) 09/25/2012   Chronic obstructive pulmonary disease, unspecified ([place]) 04/25/2012   Unknown cause of morbidity or mortality 04/25/2012   Chronic total occlusion of artery of the extremities ([place]) 04/08/2012   Onychomycosis 02/01/2012   Occlusion and stenosis of  carotid artery without mention of cerebral infarction 06/12/2011   GERD (gastroesophageal reflux disease) 05/08/2011   Barrett's esophagus without dysplasia 05/08/2011   Former tobacco use 02/01/2011   Past Medical History:  Diagnosis Date   Carotid artery occlusion 11/10/10   LEFT CAROTID ENDARTERECTOMY   Chronic kidney disease    Complication of anesthesia    BP WENT UP AT [facility] "   COPD (chronic obstructive pulmonary disease) ([place])    pt denies this dx as of 06/01/20 - no inhaler    Diabetes mellitus without complication ([place])    Diverticulitis    Diverticulosis of colon (without mention of hemorrhage)    DJD (degenerative joint disease)    knees/hands/feet/back/neck   Fatty liver    Full dentures    GERD (gastroesophageal reflux disease)    H/O hiatal hernia    History of blood transfusion    with a past surical procedure per patient 06/01/20   Hyperlipidemia    Hypertension  Neuromuscular disorder ([place])    peripheral neuropathy   Non-pressure chronic ulcer of other part of left foot limited to breakdown of skin ([place]) 11/12/2016   Osteomyelitis (HCC)    left 5th metatarsal   PAD (peripheral artery disease) ([place])    Distal aortogram June 2012. Atherectomy left popliteal artery July 2012.    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 Before age 110   Hyperlipidemia Brother    Hypertension Brother    Cancer Sister        Breast    Hyperlipidemia Sister    Hypertension Sister    Hypertension Son    Arthritis Other        GRANDMOTHER   Hypertension Other        OTHER FAMILY MEMBERS    Past Surgical History:  Procedure Laterality Date   ABDOMINAL AORTOGRAM W/LOWER EXTREMITY N/A 06/23/2021   Procedure: ABDOMINAL AORTOGRAM W/LOWER EXTREMITY;  Surgeon: Nigel Mormon, MD;  Location: [facility];  Service: Cardiovascular;  Laterality: N/A;   AMPUTATION  11/05/2011   Procedure: AMPUTATION RAY;  Surgeon: Wylene Simmer, MD;  Location: [facility];  Service: Orthopedics;  Laterality: Right;  Amputation of Right 4&5th Toes   AMPUTATION Left 11/26/2012   Procedure: AMPUTATION RAY;  Surgeon: Wylene Simmer, MD;  Location: [facility];  Service: Orthopedics;  Laterality: Left;  fourth ray amputation   AMPUTATION Right 08/27/2014   Procedure: Transmetatarsal Amputation;  Surgeon: Newt Minion, MD;  Location: [facility];  Service: Orthopedics;  Laterality: Right;   AMPUTATION Right 01/14/2015   Procedure: AMPUTATION BELOW KNEE;  Surgeon: Newt Minion, MD;  Location: [facility];  Service: Orthopedics;  Laterality: Right;   AMPUTATION Left 10/21/2015   Procedure: Left Foot 5th Ray Amputation;  Surgeon: Newt Minion, MD;  Location: [facility];  Service: Orthopedics;  Laterality: Left;   ANTERIOR FUSION CERVICAL SPINE  02/06/06   C4-5, C5-6, C6-7; SURGEON DR. MAX COHEN   AV FISTULA PLACEMENT Left 06/02/2020   Procedure: ARTERIOVENOUS (AV) FISTULA CREATION LEFT;  Surgeon: Waynetta Sandy, MD;  Location: [facility];  Service: Vascular;  Laterality: Left;   BACK SURGERY     x 3   [place] Left 07/21/2020   Procedure: LEFT UPPER ARM ATERIOVENOUS SUPERFISTULALIZATION;  Surgeon: Waynetta Sandy, MD;  Location: [facility];  Service: Vascular;  Laterality: Left;   BELOW KNEE LEG AMPUTATION Right    CARDIAC CATHETERIZATION  10/31/04   2009   CAROTID ENDARTERECTOMY  11/10/10   CAROTID ENDARTERECTOMY Left 11/10/2010   Subtotal occlusion  of left internal carotid artery with left hemispheric transient ischemic attacks.  CAROTID STENT     CARPAL TUNNEL RELEASE Right 10/21/2013   Procedure: RIGHT CARPAL TUNNEL RELEASE;  Surgeon: Wynonia Sours, MD;  Location: [facility];  Service: Orthopedics;  Laterality: Right;   CHOLECYSTECTOMY     COLON SURGERY     COLONOSCOPY     COLOSTOMY REVERSAL  05/21/2018   ileostomy reversal   CYSTOSCOPY WITH STENT PLACEMENT Bilateral 01/13/2018   Procedure: CYSTOSCOPY WITH BILATERAL URETERAL CATHETER PLACEMENT;  Surgeon: Ardis Hughs, MD;  Location: [facility];  Service: Urology;  Laterality: Bilateral;   ESOPHAGEAL MANOMETRY Bilateral 07/19/2014   Procedure: ESOPHAGEAL MANOMETRY (EM);  Surgeon: Jerene Bears, MD;  [facility];  Service: Gastroenterology;  Laterality: Bilateral;   EYE SURGERY Bilateral 2020   cataract   FEMORAL ARTERY STENT     x6   FINGER SURGERY     FOOT SURGERY  04/25/2016    EXCISION BASE 5TH METATARSAL AND PARTIAL CUBOID LEFT FOOT   HERNIA REPAIR     LEFT INGUINAL AND UMBILICAL REPAIRS   HERNIA REPAIR     I & D EXTREMITY Left 04/25/2016   Procedure: EXCISION BASE 5TH METATARSAL AND PARTIAL CUBOID LEFT FOOT;  Surgeon: Newt Minion, MD;  Location: [facility];  Service: Orthopedics;  Laterality: Left;   ILEOSTOMY  01/13/2018   Procedure: ILEOSTOMY;  Surgeon: Clovis Riley, MD;  Location: [facility];  Service: General;;   ILEOSTOMY CLOSURE N/A 05/21/2018   Procedure: ILEOSTOMY REVERSAL ERAS PATHWAY;  Surgeon: Clovis Riley, MD;  Location: [facility];  Service: General;  Laterality: N/A;   IR RADIOLOGIST EVAL & MGMT  11/19/2017   IR RADIOLOGIST EVAL & MGMT  12/03/2017   IR RADIOLOGIST EVAL & MGMT  12/18/2017   JOINT REPLACEMENT Right 2001   Total knee   LAMINECTOMY     X 3 LUMBAR AND X 2 CERVICAL SPINE OPERATIONS   LAPAROSCOPIC CHOLECYSTECTOMY W/ CHOLANGIOGRAPHY  11/09/04   SURGEON DR. [place] CATH AND CORONARY ANGIOGRAPHY N/A 09/16/2018    Procedure: LEFT HEART CATH AND CORONARY ANGIOGRAPHY;  Surgeon: Nigel Mormon, MD;  Location: [facility];  Service: Cardiovascular;  Laterality: N/A;   LEFT HEART CATHETERIZATION WITH CORONARY ANGIOGRAM N/A 10/29/2014   Procedure: LEFT HEART CATHETERIZATION WITH CORONARY ANGIOGRAM;  Surgeon: Laverda Page, MD;  Location: [facility];  Service: Cardiovascular;  Laterality: N/A;   LIGATION OF COMPETING BRANCHES OF ARTERIOVENOUS FISTULA Left 07/21/2020   Procedure: LIGATION OF COMPETING BRANCHES OF LEFT UPPER ARM ARTERIOVENOUS FISTULA;  Surgeon: Waynetta Sandy, MD;  Location: [facility];  Service: Vascular;  Laterality: Left;   LOWER EXTREMITY ANGIOGRAM N/A 03/19/2012   Procedure: LOWER EXTREMITY ANGIOGRAM;  Surgeon: Burnell Blanks, MD;  Location: [facility];  Service: Cardiovascular;  Laterality: N/A;   LOWER EXTREMITY ANGIOGRAPHY N/A 06/20/2021   Procedure: LOWER EXTREMITY ANGIOGRAPHY;  Surgeon: Nigel Mormon, MD;  Location: [facility];  Service: Cardiovascular;  Laterality: N/A;   NECK SURGERY     PARTIAL COLECTOMY N/A 01/13/2018   Procedure: LAPAROSCOPIC ASSISTED   SIGMOID COLECTOMY ILEOSTOMY;  Surgeon: Clovis Riley, MD;  [facility];  Service: General;  Laterality: N/A;   PENILE PROSTHESIS IMPLANT  08/14/05   INFRAPUBIC INSERTION OF INFLATABLE PENILE PROSTHESIS; SURGEON DR. Amalia Hailey   PENILE PROSTHESIS IMPLANT     PERCUTANEOUS CORONARY STENT INTERVENTION (PCI-S) Right 10/29/2014   Procedure: PERCUTANEOUS CORONARY STENT INTERVENTION (PCI-S);  Surgeon: Laverda Page,  MD;  Location: [facility];  Service: Cardiovascular;  Laterality: Right;   PERIPHERAL VASCULAR INTERVENTION Left 06/23/2021   Procedure: PERIPHERAL VASCULAR INTERVENTION;  Surgeon: Nigel Mormon, MD;  Location: [facility];  Service: Cardiovascular;  Laterality: Left;   REMOVAL OF PENILE PROSTHESIS N/A 06/14/2021   Procedure: Removal of THREE piece inflatable penile  prosthesis;  Surgeon: Lucas Mallow, MD;  Location: [facility];  Service: Urology;  Laterality: N/A;   SHOULDER ARTHROSCOPY     SPINE SURGERY     TOE AMPUTATION Left    TONSILLECTOMY     TOTAL KNEE ARTHROPLASTY  07/2002   RIGHT KNEE ; SURGEON  DR. [name] ALSO HAD ARTHROSCOPIC RIGHT KNEE IN  10/2001   TOTAL KNEE ARTHROPLASTY     ULNAR NERVE TRANSPOSITION Right 10/21/2013   Procedure: RIGHT ELBOW  ULNAR NERVE DECOMPRESSION;  Surgeon: Wynonia Sours, MD;  Location: [facility];  Service: Orthopedics;  Laterality: Right;   Social History   Occupational History   Occupation: Magazine features editor: UNEMPLOYED  Tobacco Use   Smoking status: Former    Packs/day: 2.00    Years: 35.00    Pack years: 70.00    Types: Cigarettes    Quit date: 10/28/2011    Years since quitting: 9.9   Smokeless tobacco: Never  Vaping Use   Vaping Use: Some days   Substances: Nicotine  Substance and Sexual Activity   Alcohol use: Not Currently    Comment: "not in a long time"   Drug use: Never   Sexual activity: Yes    Birth control/protection: Implant    Comment: penile implant

## 2021-09-20 ENCOUNTER — Encounter: Payer: Self-pay | Admitting: Family

## 2021-09-20 ENCOUNTER — Telehealth (HOSPITAL_COMMUNITY): Payer: Self-pay

## 2021-09-20 ENCOUNTER — Ambulatory Visit (INDEPENDENT_AMBULATORY_CARE_PROVIDER_SITE_OTHER): Payer: 59 | Admitting: Family

## 2021-09-20 DIAGNOSIS — L97929 Non-pressure chronic ulcer of unspecified part of left lower leg with unspecified severity: Secondary | ICD-10-CM | POA: Diagnosis not present

## 2021-09-20 DIAGNOSIS — L97521 Non-pressure chronic ulcer of other part of left foot limited to breakdown of skin: Secondary | ICD-10-CM

## 2021-09-20 DIAGNOSIS — I87332 Chronic venous hypertension (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: Secondary | ICD-10-CM

## 2021-09-20 NOTE — Progress Notes (Signed)
Office Visit Note   Patient: [name]           Date of Birth: [birth year]           MRN: [id number] Visit Date: 09/20/2021              Requested by: [name], PA-C [id number] [place],  [place] [address] PCP: [name], PA-C  Chief Complaint  Patient presents with   Left Foot - Follow-up      HPI: Patient is a 66 year old gentleman who is seen in follow-up for ischemic ulcer and venous insufficiency of the left lower extremity. he is currently undergoing serial compression wraps.   Assessment & Plan: Visit Diagnoses:  No diagnosis found.   Plan: Continue with the silver cell in the wound.  Compression wrap on the left. Hope to get him back in to a compression stockings soon.   Follow-Up Instructions: Return in about 8 days (around 09/28/2021).   Ortho Exam  Patient is alert, oriented, no adenopathy, well-dressed, normal affect, normal respiratory effort. Examination the leg has continued decreased swelling.  No new epithelialization to the wound over the lateral aspect the left foot.  This continues with has fibrinous exudative tissue there is no cellulitis odor or drainage.  The ischemic ulcer is stable.  Imaging: No results found. No images are attached to the encounter.  Labs: Lab Results  Component Value Date   HGBA1C 7.9 (H) 06/14/2021   HGBA1C 5.7 (H) 09/16/2018   HGBA1C 7.7 (H) 05/15/2018   ESRSEDRATE 40 (H) 09/03/2021   ESRSEDRATE 17 (H) 06/24/2021   ESRSEDRATE 35 (H) 09/03/2016   CRP 2.7 (H) 09/07/2021   CRP 6.4 (H) 09/06/2021   CRP 8.4 (H) 09/05/2021   REPTSTATUS 09/07/2021 FINAL 09/02/2021   GRAMSTAIN  06/14/2021    NO ORGANISMS SEEN SQUAMOUS EPITHELIAL CELLS PRESENT ABUNDANT WBC PRESENT,BOTH PMN AND MONONUCLEAR MODERATE GRAM POSITIVE COCCI    CULT  09/02/2021    NO GROWTH 5 DAYS Performed at [facility], [place] [address] [place], [place] [address]    LABORGA ENTEROCOCCUS FAECALIS 06/14/2021     Lab Results   Component Value Date   ALBUMIN 3.0 (L) 09/07/2021   ALBUMIN 3.1 (L) 09/06/2021   ALBUMIN 2.7 (L) 09/05/2021   PREALBUMIN 19.0 05/04/2016   PREALBUMIN 25.0 12/16/2015    Lab Results  Component Value Date   MG 1.9 05/24/2018   MG 2.1 05/23/2018   MG 1.3 (L) 05/22/2018   No results found for: VD25OH  Lab Results  Component Value Date   PREALBUMIN 19.0 05/04/2016   PREALBUMIN 25.0 12/16/2015   CBC EXTENDED Latest Ref Rng & Units 09/07/2021 09/06/2021 09/05/2021  WBC 4.0 - 10.5 K/uL 4.8 2.3(L) 3.8(L)  RBC 4.22 - 5.81 MIL/uL 3.72(L) 3.96(L) 3.35(L)  HGB 13.0 - 17.0 g/dL 11.1(L) 11.9(L) 10.2(L)  HCT 39.0 - 52.0 % 33.0(L) 34.9(L) 30.1(L)  PLT 150 - 400 K/uL 151 117(L) 103(L)  NEUTROABS 1.7 - 7.7 K/uL 3.6 1.9 2.1  LYMPHSABS 0.7 - 4.0 K/uL 0.8 0.3(L) 1.1     There is no height or weight on file to calculate BMI.  Orders:  No orders of the defined types were placed in this encounter.  No orders of the defined types were placed in this encounter.    Procedures: No procedures performed  Clinical Data: No additional findings.  ROS:  All other systems negative, except as noted in the HPI. Review of Systems  Objective: Vital Signs:  There were no vitals taken for this visit.  Specialty Comments:  No specialty comments available.  PMFS History: Patient Active Problem List   Diagnosis Date Noted   Acute metabolic encephalopathy 86/76/1952   COVID 09/02/2021   Sepsis ([place]) 09/02/2021   Severe sepsis with lactic acidosis ([place]) 06/14/2021   Infection associated with implanted penile prosthesis ([place]) 06/14/2021   ESRD on hemodialysis ([place]) 06/14/2021   Paroxysmal atrial fibrillation with RVR ([place]) 06/14/2021   Carotid stenosis 06/15/2020   Dyslipidemia 06/15/2020   Severe nonproliferative diabetic retinopathy of right eye, with macular edema, associated with type 2 diabetes mellitus ([place]) 01/18/2020   Severe nonproliferative diabetic retinopathy of left eye, with  macular edema, associated with type 2 diabetes mellitus ([place]) 01/04/2020   Retinal hemorrhage of right eye 01/04/2020   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   Pseudoclaudication 11/15/2018   Essential hypertension 09/18/2018   S/P BKA (below knee amputation), right ([place]) 09/18/2018   Amputation of toe of left foot ([place]) 09/18/2018   Peripheral artery disease ([place]) 09/18/2018   S/P carotid endarterectomy 09/18/2018   OSA on CPAP 09/18/2018   Chronic back pain 09/18/2018   Status post reversal of ileostomy 05/21/2018   Normocytic anemia 02/14/2018   Intra-abdominal abscess ([place]) 11/04/2017   Chronic venous hypertension (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 ([place]) 12/11/2016   Ischemic ulcer of left foot, limited to breakdown of skin ([place]) 11/12/2016   Unilateral primary osteoarthritis, left knee 10/11/2016   History of right below knee amputation ([place]) 07/12/2016   Acute osteomyelitis of left foot ([place])    Diabetic foot infection ([place]) 05/04/2016   Insulin dependent type 2 diabetes mellitus ([place]) 12/16/2015   Amputated toe ([place]) 10/21/2015   Leg edema, left 09/03/2015   CAD (dz of distal, mid and proximal RCA with implantation of 3 overlapping drug-eluting stent,) 09/03/2015   Chronic diastolic heart failure, NYHA class 2 ([place]) 09/03/2015   Angina pectoris associated with type 2 diabetes mellitus ([place]) 10/28/2014   Hyperlipidemia 08/25/2014   Limb pain 03/20/2013   DJD (degenerative joint disease) 09/25/2012   Migraine 09/25/2012   Neuropathy 09/25/2012   Restless legs syndrome (RLS) 09/25/2012   Chronic obstructive pulmonary disease, unspecified ([place]) 04/25/2012   Unknown cause of morbidity or mortality 04/25/2012   Chronic total occlusion of artery of the extremities ([place]) 04/08/2012   Onychomycosis 02/01/2012   Occlusion and stenosis of carotid artery without mention of cerebral infarction 06/12/2011   GERD (gastroesophageal reflux disease) 05/08/2011    Barrett's esophagus without dysplasia 05/08/2011   Former tobacco use 02/01/2011   Past Medical History:  Diagnosis Date   Carotid artery occlusion 11/10/10   LEFT CAROTID ENDARTERECTOMY   Chronic kidney disease    Complication of anesthesia    BP WENT UP AT [facility] "   COPD (chronic obstructive pulmonary disease) ([place])    pt denies this dx as of 06/01/20 - no inhaler    Diabetes mellitus without complication ([place])    Diverticulitis    Diverticulosis of colon (without mention of hemorrhage)    DJD (degenerative joint disease)    knees/hands/feet/back/neck   Fatty liver    Full dentures    GERD (gastroesophageal reflux disease)    H/O hiatal hernia    History of blood transfusion    with a past surical procedure per patient 06/01/20   Hyperlipidemia    Hypertension    Neuromuscular disorder ([place])    peripheral neuropathy   Non-pressure chronic ulcer of other part  of left foot limited to breakdown of skin ([place]) 11/12/2016   Osteomyelitis (H[facility])    left 5th metatarsal   PAD (peripheral artery disease) ([place])    Distal aortogram June 2012. Atherectomy left popliteal artery July 2012.    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 Before age 64   Hyperlipidemia Brother    Hypertension Brother    Cancer Sister        Breast   Hyperlipidemia Sister    Hypertension Sister    Hypertension Son    Arthritis Other        GRANDMOTHER   Hypertension  Other        OTHER FAMILY MEMBERS    Past Surgical History:  Procedure Laterality Date   ABDOMINAL AORTOGRAM W/LOWER EXTREMITY N/A 06/23/2021   Procedure: ABDOMINAL AORTOGRAM W/LOWER EXTREMITY;  Surgeon: Nigel Mormon, MD;  Location: [facility];  Service: Cardiovascular;  Laterality: N/A;   AMPUTATION  11/05/2011   Procedure: AMPUTATION RAY;  Surgeon: Wylene Simmer, MD;  Location: [facility];  Service: Orthopedics;  Laterality: Right;  Amputation of Right 4&5th Toes   AMPUTATION Left 11/26/2012   Procedure: AMPUTATION RAY;  Surgeon: Wylene Simmer, MD;  Location: [facility];  Service: Orthopedics;  Laterality: Left;  fourth ray amputation   AMPUTATION Right 08/27/2014   Procedure: Transmetatarsal Amputation;  Surgeon: Newt Minion, MD;  Location: [facility];  Service: Orthopedics;  Laterality: Right;   AMPUTATION Right 01/14/2015   Procedure: AMPUTATION BELOW KNEE;  Surgeon: Newt Minion, MD;  Location: [facility];  Service: Orthopedics;  Laterality: Right;   AMPUTATION Left 10/21/2015   Procedure: Left Foot 5th Ray Amputation;  Surgeon: Newt Minion, MD;  Location: [facility];  Service: Orthopedics;  Laterality: Left;   ANTERIOR FUSION CERVICAL SPINE  02/06/06   C4-5, C5-6, C6-7; SURGEON DR. MAX COHEN   AV FISTULA PLACEMENT Left 06/02/2020   Procedure: ARTERIOVENOUS (AV) FISTULA CREATION LEFT;  Surgeon: Waynetta Sandy, MD;  Location: [facility];  Service: Vascular;  Laterality: Left;   BACK SURGERY     x 3   [place] Left 07/21/2020   Procedure: LEFT UPPER ARM ATERIOVENOUS SUPERFISTULALIZATION;  Surgeon: Waynetta Sandy, MD;  Location: [facility];  Service: Vascular;  Laterality: Left;   BELOW KNEE LEG AMPUTATION Right    CARDIAC CATHETERIZATION  10/31/04   2009   CAROTID ENDARTERECTOMY  11/10/10   CAROTID ENDARTERECTOMY Left 11/10/2010   Subtotal occlusion of left internal carotid artery with left hemispheric transient ischemic attacks.   CAROTID STENT     CARPAL TUNNEL  RELEASE Right 10/21/2013   Procedure: RIGHT CARPAL  TUNNEL RELEASE;  Surgeon: Wynonia Sours, MD;  Location: [facility];  Service: Orthopedics;  Laterality: Right;   CHOLECYSTECTOMY     COLON SURGERY     COLONOSCOPY     COLOSTOMY REVERSAL  05/21/2018   ileostomy reversal   CYSTOSCOPY WITH STENT PLACEMENT Bilateral 01/13/2018   Procedure: CYSTOSCOPY WITH BILATERAL URETERAL CATHETER PLACEMENT;  Surgeon: Ardis Hughs, MD;  Location: [facility];  Service: Urology;  Laterality: Bilateral;   ESOPHAGEAL MANOMETRY Bilateral 07/19/2014   Procedure: ESOPHAGEAL MANOMETRY (EM);  Surgeon: Jerene Bears, MD;  [facility];  Service: Gastroenterology;  Laterality: Bilateral;   EYE SURGERY Bilateral 2020   cataract   FEMORAL ARTERY STENT     x6   FINGER SURGERY     FOOT SURGERY  04/25/2016    EXCISION BASE 5TH METATARSAL AND PARTIAL CUBOID LEFT FOOT   HERNIA REPAIR     LEFT INGUINAL AND UMBILICAL REPAIRS   HERNIA REPAIR     I & D EXTREMITY Left 04/25/2016   Procedure: EXCISION BASE 5TH METATARSAL AND PARTIAL CUBOID LEFT FOOT;  Surgeon: Newt Minion, MD;  Location: [facility];  Service: Orthopedics;  Laterality: Left;   ILEOSTOMY  01/13/2018   Procedure: ILEOSTOMY;  Surgeon: Clovis Riley, MD;  Location[facility];  Service: General;;   ILEOSTOMY CLOSURE N/A 05/21/2018   Procedure: ILEOSTOMY REVERSAL ERAS PATHWAY;  Surgeon: Clovis Riley, MD;  Location: [facility];  Service: General;  Laterality: N/A;   IR RADIOLOGIST EVAL & MGMT  11/19/2017   IR RADIOLOGIST EVAL & MGMT  12/03/2017   IR RADIOLOGIST EVAL & MGMT  12/18/2017   JOINT REPLACEMENT Right 2001   Total knee   LAMINECTOMY     X 3 LUMBAR AND X 2 CERVICAL SPINE OPERATIONS   LAPAROSCOPIC CHOLECYSTECTOMY W/ CHOLANGIOGRAPHY  11/09/04   SURGEON DR. Mabscott CATH AND CORONARY ANGIOGRAPHY N/A 09/16/2018   Procedure: LEFT HEART CATH AND CORONARY ANGIOGRAPHY;  Surgeon: Nigel Mormon, MD;  Location: [facility];   Service: Cardiovascular;  Laterality: N/A;   LEFT HEART CATHETERIZATION WITH CORONARY ANGIOGRAM N/A 10/29/2014   Procedure: LEFT HEART CATHETERIZATION WITH CORONARY ANGIOGRAM;  Surgeon: Laverda Page, MD;  Location[facility];  Service: Cardiovascular;  Laterality: N/A;   LIGATION OF COMPETING BRANCHES OF ARTERIOVENOUS FISTULA Left 07/21/2020   Procedure: LIGATION OF COMPETING BRANCHES OF LEFT UPPER ARM ARTERIOVENOUS FISTULA;  Surgeon: Waynetta Sandy, MD;  Location: [facility];  Service: Vascular;  Laterality: Left;   LOWER EXTREMITY ANGIOGRAM N/A 03/19/2012   Procedure: LOWER EXTREMITY ANGIOGRAM;  Surgeon: Burnell Blanks, MD;  Location: [facility];  Service: Cardiovascular;  Laterality: N/A;   LOWER EXTREMITY ANGIOGRAPHY N/A 06/20/2021   Procedure: LOWER EXTREMITY ANGIOGRAPHY;  Surgeon: Nigel Mormon, MD;  Location: [facility];  Service: Cardiovascular;  Laterality: N/A;   NECK SURGERY     PARTIAL COLECTOMY N/A 01/13/2018   Procedure: LAPAROSCOPIC ASSISTED   SIGMOID COLECTOMY ILEOSTOMY;  Surgeon: Clovis Riley, MD;  [facility];  Service: General;  Laterality: N/A;   PENILE PROSTHESIS IMPLANT  08/14/05   INFRAPUBIC INSERTION OF INFLATABLE PENILE PROSTHESIS; SURGEON DR. Amalia Hailey   PENILE PROSTHESIS IMPLANT     PERCUTANEOUS CORONARY STENT INTERVENTION (PCI-S) Right 10/29/2014   Procedure: PERCUTANEOUS CORONARY STENT INTERVENTION (PCI-S);  Surgeon: Laverda Page, MD;  Location: [facility];  Service: Cardiovascular;  Laterality: Right;   PERIPHERAL VASCULAR  INTERVENTION Left 06/23/2021   Procedure: PERIPHERAL VASCULAR INTERVENTION;  Surgeon: Nigel Mormon, MD;  Location: [facility];  Service: Cardiovascular;  Laterality: Left;   REMOVAL OF PENILE PROSTHESIS N/A 06/14/2021   Procedure: Removal of THREE piece inflatable penile prosthesis;  Surgeon: Lucas Mallow, MD;  Location: [facility];  Service: Urology;  Laterality: N/A;   SHOULDER  ARTHROSCOPY     SPINE SURGERY     TOE AMPUTATION Left    TONSILLECTOMY     TOTAL KNEE ARTHROPLASTY  07/2002   RIGHT KNEE ; SURGEON  DR. [name] ALSO HAD ARTHROSCOPIC RIGHT KNEE IN  10/2001   TOTAL KNEE ARTHROPLASTY     ULNAR NERVE TRANSPOSITION Right 10/21/2013   Procedure: RIGHT ELBOW  ULNAR NERVE DECOMPRESSION;  Surgeon: Wynonia Sours, MD;  Location: [facility];  Service: Orthopedics;  Laterality: Right;   Social History   Occupational History   Occupation: Magazine features editor: UNEMPLOYED  Tobacco Use   Smoking status: Former    Packs/day: 2.00    Years: 35.00    Pack years: 70.00    Types: Cigarettes    Quit date: 10/28/2011    Years since quitting: 9.9   Smokeless tobacco: Never  Vaping Use   Vaping Use: Some days   Substances: Nicotine  Substance and Sexual Activity   Alcohol use: Not Currently    Comment: "not in a long time"   Drug use: Never   Sexual activity: Yes    Birth control/protection: Implant    Comment: penile implant

## 2021-09-20 NOTE — Telephone Encounter (Signed)
Received voicemail stating that the patient will no longer be receiving care at [facility] as the patient will be seeing Dr.Gangi to continue their care.

## 2021-09-22 ENCOUNTER — Ambulatory Visit: Payer: 59 | Admitting: Family

## 2021-09-25 ENCOUNTER — Ambulatory Visit: Payer: 59

## 2021-09-29 ENCOUNTER — Other Ambulatory Visit: Payer: Self-pay

## 2021-09-29 ENCOUNTER — Ambulatory Visit (INDEPENDENT_AMBULATORY_CARE_PROVIDER_SITE_OTHER): Payer: 59 | Admitting: Family

## 2021-09-29 DIAGNOSIS — I87332 Chronic venous hypertension (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: Secondary | ICD-10-CM

## 2021-09-29 DIAGNOSIS — L97521 Non-pressure chronic ulcer of other part of left foot limited to breakdown of skin: Secondary | ICD-10-CM | POA: Diagnosis not present

## 2021-09-29 DIAGNOSIS — L97929 Non-pressure chronic ulcer of unspecified part of left lower leg with unspecified severity: Secondary | ICD-10-CM

## 2021-10-02 ENCOUNTER — Other Ambulatory Visit: Payer: Self-pay

## 2021-10-02 ENCOUNTER — Ambulatory Visit: Payer: 59

## 2021-10-02 DIAGNOSIS — L97521 Non-pressure chronic ulcer of other part of left foot limited to breakdown of skin: Secondary | ICD-10-CM

## 2021-10-02 NOTE — Progress Notes (Signed)
Patient is here for a compression dressing change for his LLE. Patient has an ulcer on the lateal side of his foot. Iodosorb and gauze had been applied to the area with a Profore dressing on top. This was removed. Photo obtained. There is still quite a bit of drainage from the wound but it did not soak through the dressing. Reapplied Iodosorb and Profore wrap and the pt will come back to the office on Friday to see Erin and reapplication of dressing. Pt will call with any questions.    Tanvi Gatling, RMA,CWCA

## 2021-10-06 ENCOUNTER — Ambulatory Visit (INDEPENDENT_AMBULATORY_CARE_PROVIDER_SITE_OTHER): Payer: 59 | Admitting: Family

## 2021-10-06 ENCOUNTER — Other Ambulatory Visit: Payer: Self-pay

## 2021-10-06 ENCOUNTER — Ambulatory Visit: Payer: 59 | Admitting: Cardiology

## 2021-10-06 ENCOUNTER — Encounter: Payer: Self-pay | Admitting: Family

## 2021-10-06 DIAGNOSIS — L97521 Non-pressure chronic ulcer of other part of left foot limited to breakdown of skin: Secondary | ICD-10-CM | POA: Diagnosis not present

## 2021-10-06 DIAGNOSIS — I87332 Chronic venous hypertension (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: Secondary | ICD-10-CM | POA: Diagnosis not present

## 2021-10-06 DIAGNOSIS — L97929 Non-pressure chronic ulcer of unspecified part of left lower leg with unspecified severity: Secondary | ICD-10-CM | POA: Diagnosis not present

## 2021-10-06 NOTE — Progress Notes (Signed)
Office Visit Note   Patient: [name]           Date of Birth: 31-Jan-1956           MRN: [id number] Visit Date: 09/29/2021              Requested by: [name], PA-C [address] [place],  [place] [address] PCP: [name], PA-C  Chief Complaint  Patient presents with   Left Leg - Follow-up      HPI: Patient is a 66 year old gentleman who is seen in follow-up for ischemic ulcer and venous insufficiency of the left lower extremity. he is currently undergoing serial compression wraps.  Has been resistant to transition to the compression stocking   Assessment & Plan: Visit Diagnoses:  1. Ischemic ulcer of left foot, limited to breakdown of skin ([place])   2. Chronic venous hypertension (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 (HCC)      Plan: Continue with the silver cell in the wound.  Compression wrap on the left.   Will continue to monitor. Hope to transition away from serial wraps.  Follow-Up Instructions: Return in about 1 week (around 10/06/2021).   Ortho Exam  Patient is alert, oriented, no adenopathy, well-dressed, normal affect, normal respiratory effort. Examination the leg has continued decreased swelling.  No new epithelialization to the wound over the lateral aspect the left foot.  This continues with has fibrinous exudative tissue there is no cellulitis odor or drainage.   This is 5 cm x 2cm Imaging: No results found. No images are attached to the encounter.  Labs: Lab Results  Component Value Date   HGBA1C 7.9 (H) 06/14/2021   HGBA1C 5.7 (H) 09/16/2018   HGBA1C 7.7 (H) 05/15/2018   ESRSEDRATE 40 (H) 09/03/2021   ESRSEDRATE 17 (H) 06/24/2021   ESRSEDRATE 35 (H) 09/03/2016   CRP 2.7 (H) 09/07/2021   CRP 6.4 (H) 09/06/2021   CRP 8.4 (H) 09/05/2021   REPTSTATUS 09/07/2021 FINAL 09/02/2021   GRAMSTAIN  06/14/2021    NO ORGANISMS SEEN SQUAMOUS EPITHELIAL CELLS PRESENT ABUNDANT WBC PRESENT,BOTH PMN AND  MONONUCLEAR MODERATE GRAM POSITIVE COCCI    CULT  09/02/2021    NO GROWTH 5 DAYS Performed at [facility], [place] [address] [place], [place] [address]    LABORGA ENTEROCOCCUS FAECALIS 06/14/2021     Lab Results  Component Value Date   ALBUMIN 3.0 (L) 09/07/2021   ALBUMIN 3.1 (L) 09/06/2021   ALBUMIN 2.7 (L) 09/05/2021   PREALBUMIN 19.0 05/04/2016   PREALBUMIN 25.0 12/16/2015    Lab Results  Component Value Date   MG 1.9 05/24/2018   MG 2.1 05/23/2018   MG 1.3 (L) 05/22/2018   No results found for: VD25OH  Lab Results  Component Value Date   PREALBUMIN 19.0 05/04/2016   PREALBUMIN 25.0 12/16/2015   CBC EXTENDED Latest Ref Rng & Units 09/07/2021 09/06/2021 09/05/2021  WBC 4.0 - 10.5 K/uL 4.8 2.3(L) 3.8(L)  RBC 4.22 - 5.81 MIL/uL 3.72(L) 3.96(L) 3.35(L)  HGB 13.0 - 17.0 g/dL 11.1(L) 11.9(L) 10.2(L)  HCT 39.0 - 52.0 % 33.0(L) 34.9(L) 30.1(L)  PLT 150 - 400 K/uL 151 117(L) 103(L)  NEUTROABS 1.7 - 7.7 K/uL 3.6 1.9 2.1  LYMPHSABS 0.7 - 4.0 K/uL 0.8 0.3(L) 1.1     There is no height or weight on file to calculate BMI.  Orders:  No orders of the defined types were placed in this encounter.  No orders of the  defined types were placed in this encounter.    Procedures: No procedures performed  Clinical Data: No additional findings.  ROS:  All other systems negative, except as noted in the HPI. Review of Systems  Objective: Vital Signs: There were no vitals taken for this visit.  Specialty Comments:  No specialty comments available.  PMFS History: Patient Active Problem List   Diagnosis Date Noted   Acute metabolic encephalopathy 36/14/4315   COVID 09/02/2021   Sepsis ([place]) 09/02/2021   Severe sepsis with lactic acidosis ([place]) 06/14/2021   Infection associated with implanted penile prosthesis ([place]) 06/14/2021   ESRD on hemodialysis ([place]) 06/14/2021   Paroxysmal atrial fibrillation with RVR ([place]) 06/14/2021   Carotid stenosis 06/15/2020    Dyslipidemia 06/15/2020   Severe nonproliferative diabetic retinopathy of right eye, with macular edema, associated with type 2 diabetes mellitus (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   Pseudoclaudication 11/15/2018   Essential hypertension 09/18/2018   S/P BKA (below knee amputation), right ([place]) 09/18/2018   Amputation of toe of left foot ([place]) 09/18/2018   Peripheral artery disease ([place]) 09/18/2018   S/P carotid endarterectomy 09/18/2018   OSA on CPAP 09/18/2018   Chronic back pain 09/18/2018   Status post reversal of ileostomy 05/21/2018   Normocytic anemia 02/14/2018   Intra-abdominal abscess ([place]) 11/04/2017   Chronic venous hypertension (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 ([place]) 12/11/2016   Ischemic ulcer of left foot, limited to breakdown of skin ([place]) 11/12/2016   Unilateral primary osteoarthritis, left knee 10/11/2016   History of right below knee amputation ([place]) 07/12/2016   Acute osteomyelitis of left foot ([place])    Diabetic foot infection ([place]) 05/04/2016   Insulin dependent type 2 diabetes mellitus ([place]) 12/16/2015   Amputated toe ([place]) 10/21/2015   Leg edema, left 09/03/2015   CAD (dz of distal, mid and proximal RCA with implantation of 3 overlapping drug-eluting stent,) 09/03/2015   Chronic diastolic heart failure, NYHA class 2 ([place]) 09/03/2015   Angina pectoris associated with type 2 diabetes mellitus ([place]) 10/28/2014   Hyperlipidemia 08/25/2014   Limb pain 03/20/2013   DJD (degenerative joint disease) 09/25/2012   Migraine 09/25/2012   Neuropathy 09/25/2012   Restless legs syndrome (RLS) 09/25/2012   Chronic obstructive pulmonary disease, unspecified ([place]) 04/25/2012   Unknown cause of morbidity or mortality 04/25/2012   Chronic total  occlusion of artery of the extremities ([place]) 04/08/2012   Onychomycosis 02/01/2012   Occlusion and stenosis of carotid artery without mention of cerebral infarction 06/12/2011   GERD (gastroesophageal reflux disease) 05/08/2011   Barrett's esophagus without dysplasia 05/08/2011   Former tobacco use 02/01/2011   Past Medical History:  Diagnosis Date   Carotid artery occlusion 11/10/10   LEFT CAROTID ENDARTERECTOMY   Chronic kidney disease    Complication of anesthesia    BP WENT UP AT [facility] "   COPD (chronic obstructive pulmonary disease) ([place])    pt denies this dx as of 06/01/20 - no inhaler    Diabetes mellitus without complication ([place])    Diverticulitis    Diverticulosis of colon (without mention of hemorrhage)    DJD (degenerative joint disease)    knees/hands/feet/back/neck   Fatty liver    Full dentures    GERD (gastroesophageal reflux disease)    H/O hiatal hernia    History  of blood transfusion    with a past surical procedure per patient 06/01/20   Hyperlipidemia    Hypertension    Neuromuscular disorder ([place])    peripheral neuropathy   Non-pressure chronic ulcer of other part of left foot limited to breakdown of skin ([place]) 11/12/2016   Osteomyelitis (HCC)    left 5th metatarsal   PAD (peripheral artery disease) ([place])    Distal aortogram June 2012. Atherectomy left popliteal artery July 2012.    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 Before age 24   Hyperlipidemia Brother    Hypertension Brother    Cancer Sister        Breast   Hyperlipidemia Sister    Hypertension Sister    Hypertension Son    Arthritis Other        GRANDMOTHER   Hypertension Other        OTHER FAMILY MEMBERS    Past Surgical History:  Procedure Laterality Date   ABDOMINAL AORTOGRAM W/LOWER EXTREMITY N/A 06/23/2021   Procedure: ABDOMINAL AORTOGRAM W/LOWER EXTREMITY;  Surgeon: Nigel Mormon, MD;  Location: [facility];  Service: Cardiovascular;  Laterality: N/A;   AMPUTATION  11/05/2011   Procedure: AMPUTATION RAY;  Surgeon: Wylene Simmer, MD;  Location: [facility];  Service: Orthopedics;  Laterality: Right;  Amputation of Right 4&5th Toes   AMPUTATION Left 11/26/2012   Procedure: AMPUTATION RAY;  Surgeon: Wylene Simmer, MD;  Location: [facility];  Service: Orthopedics;  Laterality: Left;  fourth ray amputation   AMPUTATION Right 08/27/2014   Procedure: Transmetatarsal Amputation;  Surgeon: Newt Minion, MD;  Location: [facility];  Service: Orthopedics;  Laterality: Right;   AMPUTATION Right 01/14/2015   Procedure: AMPUTATION BELOW KNEE;  Surgeon: Newt Minion, MD;  Location: [facility];  Service: Orthopedics;  Laterality: Right;   AMPUTATION Left 10/21/2015   Procedure: Left Foot 5th Ray Amputation;  Surgeon: Newt Minion, MD;  Location: [facility];  Service: Orthopedics;  Laterality: Left;   ANTERIOR FUSION CERVICAL SPINE  02/06/06   C4-5, C5-6, C6-7; SURGEON DR. MAX COHEN   AV FISTULA PLACEMENT Left 06/02/2020   Procedure: ARTERIOVENOUS (AV) FISTULA CREATION LEFT;  Surgeon: Waynetta Sandy, MD;  Location: [facility];  Service: Vascular;  Laterality: Left;   BACK SURGERY     x 3   [place] Left 07/21/2020   Procedure: LEFT UPPER ARM ATERIOVENOUS SUPERFISTULALIZATION;  Surgeon: Waynetta Sandy, MD;  Location: [facility];  Service: Vascular;  Laterality: Left;   BELOW KNEE LEG AMPUTATION Right    CARDIAC CATHETERIZATION   10/31/04   2009   CAROTID ENDARTERECTOMY  11/10/10   CAROTID ENDARTERECTOMY Left 11/10/2010   Subtotal occlusion of left internal carotid artery with left hemispheric transient ischemic attacks.   CAROTID STENT     CARPAL TUNNEL RELEASE Right 10/21/2013   Procedure: RIGHT CARPAL TUNNEL RELEASE;  Surgeon: Wynonia Sours, MD;  Location: [facility];  Service: Orthopedics;  Laterality: Right;   CHOLECYSTECTOMY     COLON SURGERY     COLONOSCOPY     COLOSTOMY REVERSAL  05/21/2018   ileostomy reversal   CYSTOSCOPY WITH STENT PLACEMENT Bilateral 01/13/2018   Procedure: CYSTOSCOPY WITH BILATERAL URETERAL CATHETER PLACEMENT;  Surgeon: Ardis Hughs, MD;  Location[facility];  Service: Urology;  Laterality: Bilateral;   ESOPHAGEAL MANOMETRY Bilateral 07/19/2014   Procedure: ESOPHAGEAL MANOMETRY (EM);  Surgeon: Jerene Bears, MD;  [facility];  Service: Gastroenterology;  Laterality: Bilateral;   EYE SURGERY Bilateral 2020   cataract   FEMORAL ARTERY STENT     x6   FINGER SURGERY     FOOT SURGERY  04/25/2016    EXCISION BASE 5TH METATARSAL AND PARTIAL CUBOID LEFT FOOT   HERNIA REPAIR     LEFT INGUINAL AND UMBILICAL REPAIRS   HERNIA REPAIR     I & D EXTREMITY Left 04/25/2016   Procedure: EXCISION BASE 5TH METATARSAL AND PARTIAL CUBOID LEFT FOOT;  Surgeon: Newt Minion, MD;  Location: [facility];  Service: Orthopedics;  Laterality: Left;   ILEOSTOMY  01/13/2018   Procedure: ILEOSTOMY;  Surgeon: Clovis Riley, MD;  Location[facility];  Service: General;;   ILEOSTOMY CLOSURE N/A 05/21/2018   Procedure: ILEOSTOMY REVERSAL ERAS PATHWAY;  Surgeon: Clovis Riley, MD;  Location: [facility];  Service: General;  Laterality: N/A;   IR RADIOLOGIST EVAL & MGMT  11/19/2017   IR RADIOLOGIST EVAL & MGMT  12/03/2017   IR RADIOLOGIST EVAL & MGMT  12/18/2017   JOINT REPLACEMENT Right 2001   Total knee   LAMINECTOMY     X 3 LUMBAR AND X 2 CERVICAL SPINE OPERATIONS   LAPAROSCOPIC CHOLECYSTECTOMY W/  CHOLANGIOGRAPHY  11/09/04   SURGEON DR. Nederland CATH AND CORONARY ANGIOGRAPHY N/A 09/16/2018   Procedure: LEFT HEART CATH AND CORONARY ANGIOGRAPHY;  Surgeon: Nigel Mormon, MD;  Location: [facility];  Service: Cardiovascular;  Laterality: N/A;   LEFT HEART CATHETERIZATION WITH CORONARY ANGIOGRAM N/A 10/29/2014   Procedure: LEFT HEART CATHETERIZATION WITH CORONARY ANGIOGRAM;  Surgeon: Laverda Page, MD;  Location: [facility];  Service: Cardiovascular;  Laterality: N/A;   LIGATION OF COMPETING BRANCHES OF ARTERIOVENOUS FISTULA Left 07/21/2020   Procedure: LIGATION OF COMPETING BRANCHES OF LEFT UPPER ARM ARTERIOVENOUS FISTULA;  Surgeon: Waynetta Sandy, MD;  Location: [facility];  Service: Vascular;  Laterality: Left;   LOWER EXTREMITY ANGIOGRAM N/A 03/19/2012   Procedure: LOWER EXTREMITY ANGIOGRAM;  Surgeon: Burnell Blanks, MD;  Location: [facility];  Service: Cardiovascular;  Laterality: N/A;   LOWER EXTREMITY ANGIOGRAPHY N/A 06/20/2021   Procedure: LOWER EXTREMITY ANGIOGRAPHY;  Surgeon: Nigel Mormon, MD;  Location: [facility];  Service: Cardiovascular;  Laterality: N/A;   NECK SURGERY     PARTIAL COLECTOMY N/A 01/13/2018   Procedure: LAPAROSCOPIC ASSISTED   SIGMOID COLECTOMY ILEOSTOMY;  Surgeon: Clovis Riley, MD;  [facility];  Service: General;  Laterality: N/A;   PENILE PROSTHESIS IMPLANT  08/14/05   INFRAPUBIC INSERTION OF INFLATABLE PENILE PROSTHESIS; SURGEON DR. Amalia Hailey   PENILE PROSTHESIS IMPLANT  PERCUTANEOUS CORONARY STENT INTERVENTION (PCI-S) Right 10/29/2014   Procedure: PERCUTANEOUS CORONARY STENT INTERVENTION (PCI-S);  Surgeon: Laverda Page, MD;  Location: [facility];  Service: Cardiovascular;  Laterality: Right;   PERIPHERAL VASCULAR INTERVENTION Left 06/23/2021   Procedure: PERIPHERAL VASCULAR INTERVENTION;  Surgeon: Nigel Mormon, MD;  Location: [facility];  Service: Cardiovascular;   Laterality: Left;   REMOVAL OF PENILE PROSTHESIS N/A 06/14/2021   Procedure: Removal of THREE piece inflatable penile prosthesis;  Surgeon: Lucas Mallow, MD;  Location: [facility];  Service: Urology;  Laterality: N/A;   SHOULDER ARTHROSCOPY     SPINE SURGERY     TOE AMPUTATION Left    TONSILLECTOMY     TOTAL KNEE ARTHROPLASTY  07/2002   RIGHT KNEE ; SURGEON  DR. [name] ALSO HAD ARTHROSCOPIC RIGHT KNEE IN  10/2001   TOTAL KNEE ARTHROPLASTY     ULNAR NERVE TRANSPOSITION Right 10/21/2013   Procedure: RIGHT ELBOW  ULNAR NERVE DECOMPRESSION;  Surgeon: Wynonia Sours, MD;  Location: [facility];  Service: Orthopedics;  Laterality: Right;   Social History   Occupational History   Occupation: Magazine features editor: UNEMPLOYED  Tobacco Use   Smoking status: Former    Packs/day: 2.00    Years: 35.00    Pack years: 70.00    Types: Cigarettes    Quit date: 10/28/2011    Years since quitting: 9.9   Smokeless tobacco: Never  Vaping Use   Vaping Use: Some days   Substances: Nicotine  Substance and Sexual Activity   Alcohol use: Not Currently    Comment: "not in a long time"   Drug use: Never   Sexual activity: Yes    Birth control/protection: Implant    Comment: penile implant

## 2021-10-09 ENCOUNTER — Ambulatory Visit (INDEPENDENT_AMBULATORY_CARE_PROVIDER_SITE_OTHER): Payer: 59 | Admitting: Family

## 2021-10-09 DIAGNOSIS — L97521 Non-pressure chronic ulcer of other part of left foot limited to breakdown of skin: Secondary | ICD-10-CM

## 2021-10-09 NOTE — Progress Notes (Signed)
Patient is here for his twice a week dressing change.  Silvercell applied to the lateral foot wound and Profore dressing reapplied. The wound does like it is not draining as much. Patient please with this. He is scheduled for follow up on Friday and will call with any questions.   Hailyn Zarr, [place], [employer]

## 2021-10-13 ENCOUNTER — Ambulatory Visit (INDEPENDENT_AMBULATORY_CARE_PROVIDER_SITE_OTHER): Payer: 59 | Admitting: Family

## 2021-10-13 ENCOUNTER — Encounter: Payer: Self-pay | Admitting: Family

## 2021-10-13 ENCOUNTER — Other Ambulatory Visit: Payer: Self-pay

## 2021-10-13 DIAGNOSIS — L97929 Non-pressure chronic ulcer of unspecified part of left lower leg with unspecified severity: Secondary | ICD-10-CM | POA: Diagnosis not present

## 2021-10-13 DIAGNOSIS — L97521 Non-pressure chronic ulcer of other part of left foot limited to breakdown of skin: Secondary | ICD-10-CM

## 2021-10-13 DIAGNOSIS — I87332 Chronic venous hypertension (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: Secondary | ICD-10-CM

## 2021-10-13 NOTE — Progress Notes (Signed)
Office Visit Note   Patient: [name]           Date of Birth: [birth year]           MRN: [id number] Visit Date: 10/13/2021              Requested by: [name], PA-C [address] [place],  [place] [address] PCP: [name], PA-C  Chief Complaint  Patient presents with   Left Foot - Wound Check, Follow-up      HPI: Patient is a 66 year old gentleman who is seen in follow-up for ischemic ulcer and venous insufficiency of the left lower extremity. he is currently undergoing serial compression wraps.  Has been resistant to transition to the compression stocking  We had tried using Iodosorb over the wound this was not helpful. Are now using silvercell over wound.   Assessment & Plan: Visit Diagnoses:  No diagnosis found.    Plan: We will continue with serial compression wrapping twice weekly.  Silver cell over the wound.   Follow-Up Instructions: No follow-ups on file.   Ortho Exam  Patient is alert, oriented, no adenopathy, well-dressed, normal affect, normal respiratory effort. Examination the leg has controlled edema.  New epithelialization to the wound with bridging of the ulcer. No w with two ulcers measuring2 cm x 6 mm and 8 mm x 1 cm. over the lateral aspect the left foot.  This continues with has fibrinous exudative tissue. there is no cellulitis odor or drainage.  This does not probe to bone  Imaging: No results found. No images are attached to the encounter.  Labs: Lab Results  Component Value Date   HGBA1C 7.9 (H) 06/14/2021   HGBA1C 5.7 (H) 09/16/2018   HGBA1C 7.7 (H) 05/15/2018   ESRSEDRATE 40 (H) 09/03/2021   ESRSEDRATE 17 (H) 06/24/2021   ESRSEDRATE 35 (H) 09/03/2016   CRP 2.7 (H) 09/07/2021   CRP 6.4 (H) 09/06/2021   CRP 8.4 (H) 09/05/2021   REPTSTATUS 09/07/2021 FINAL 09/02/2021   GRAMSTAIN  06/14/2021    NO ORGANISMS SEEN SQUAMOUS EPITHELIAL CELLS PRESENT ABUNDANT WBC PRESENT,BOTH PMN AND MONONUCLEAR MODERATE GRAM POSITIVE  COCCI    CULT  09/02/2021    NO GROWTH 5 DAYS Performed at [facility], [place] [address], [place], [place] [address]    LABORGA ENTEROCOCCUS FAECALIS 06/14/2021     Lab Results  Component Value Date   ALBUMIN 3.0 (L) 09/07/2021   ALBUMIN 3.1 (L) 09/06/2021   ALBUMIN 2.7 (L) 09/05/2021   PREALBUMIN 19.0 05/04/2016   PREALBUMIN 25.0 12/16/2015    Lab Results  Component Value Date   MG 1.9 05/24/2018   MG 2.1 05/23/2018   MG 1.3 (L) 05/22/2018   No results found for: VD25OH  Lab Results  Component Value Date   PREALBUMIN 19.0 05/04/2016   PREALBUMIN 25.0 12/16/2015   CBC EXTENDED Latest Ref Rng & Units 09/07/2021 09/06/2021 09/05/2021  WBC 4.0 - 10.5 K/uL 4.8 2.3(L) 3.8(L)  RBC 4.22 - 5.81 MIL/uL 3.72(L) 3.96(L) 3.35(L)  HGB 13.0 - 17.0 g/dL 11.1(L) 11.9(L) 10.2(L)  HCT 39.0 - 52.0 % 33.0(L) 34.9(L) 30.1(L)  PLT 150 - 400 K/uL 151 117(L) 103(L)  NEUTROABS 1.7 - 7.7 K/uL 3.6 1.9 2.1  LYMPHSABS 0.7 - 4.0 K/uL 0.8 0.3(L) 1.1     There is no height or weight on file to calculate BMI.  Orders:  No orders of the defined types were placed in this encounter.  No orders of the defined  types were placed in this encounter.    Procedures: No procedures performed  Clinical Data: No additional findings.  ROS:  All other systems negative, except as noted in the HPI. Review of Systems  Objective: Vital Signs: There were no vitals taken for this visit.  Specialty Comments:  No specialty comments available.  PMFS History: Patient Active Problem List   Diagnosis Date Noted   Acute metabolic encephalopathy 99/37/1696   COVID 09/02/2021   Sepsis ([place]) 09/02/2021   Severe sepsis with lactic acidosis ([place]) 06/14/2021   Infection associated with implanted penile prosthesis ([place]) 06/14/2021   ESRD on hemodialysis ([place]) 06/14/2021   Paroxysmal atrial fibrillation with RVR ([place]) 06/14/2021   Carotid stenosis 06/15/2020   Dyslipidemia 06/15/2020   Severe  nonproliferative diabetic retinopathy of right eye, with macular edema, associated with type 2 diabetes mellitus ([place]) 01/18/2020   Severe nonproliferative diabetic retinopathy of left eye, with macular edema, associated with type 2 diabetes mellitus ([place]) 01/04/2020   Retinal hemorrhage of right eye 01/04/2020   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   Pseudoclaudication 11/15/2018   Essential hypertension 09/18/2018   S/P BKA (below knee amputation), right ([place]) 09/18/2018   Amputation of toe of left foot ([place]) 09/18/2018   Peripheral artery disease ([place]) 09/18/2018   S/P carotid endarterectomy 09/18/2018   OSA on CPAP 09/18/2018   Chronic back pain 09/18/2018   Status post reversal of ileostomy 05/21/2018   Normocytic anemia 02/14/2018   Intra-abdominal abscess ([place]) 11/04/2017   Chronic venous hypertension (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 ([place]) 12/11/2016   Ischemic ulcer of left foot, limited to breakdown of skin ([place]) 11/12/2016   Unilateral primary osteoarthritis, left knee 10/11/2016   History of right below knee amputation ([place]) 07/12/2016   Acute osteomyelitis of left foot ([place])    Diabetic foot infection ([place]) 05/04/2016   Insulin dependent type 2 diabetes mellitus ([place]) 12/16/2015   Amputated toe ([place]) 10/21/2015   Leg edema, left 09/03/2015   CAD (dz of distal, mid and proximal RCA with implantation of 3 overlapping drug-eluting stent,) 09/03/2015   Chronic diastolic heart failure, NYHA class 2 ([place]) 09/03/2015   Angina pectoris associated with type 2 diabetes mellitus ([place]) 10/28/2014   Hyperlipidemia 08/25/2014   Limb pain 03/20/2013   DJD (degenerative joint disease) 09/25/2012   Migraine 09/25/2012   Neuropathy 09/25/2012   Restless legs syndrome (RLS) 09/25/2012   Chronic obstructive pulmonary disease, unspecified ([place]) 04/25/2012   Unknown cause of morbidity or mortality 04/25/2012   Chronic total occlusion of artery of the extremities  ([place]) 04/08/2012   Onychomycosis 02/01/2012   Occlusion and stenosis of carotid artery without mention of cerebral infarction 06/12/2011   GERD (gastroesophageal reflux disease) 05/08/2011   Barrett's esophagus without dysplasia 05/08/2011   Former tobacco use 02/01/2011   Past Medical History:  Diagnosis Date   Carotid artery occlusion 11/10/10   LEFT CAROTID ENDARTERECTOMY   Chronic kidney disease    Complication of anesthesia    BP WENT UP AT [facility] "   COPD (chronic obstructive pulmonary disease) ([place])    pt denies this dx as of 06/01/20 - no inhaler    Diabetes mellitus without complication ([place])    Diverticulitis    Diverticulosis of colon (without mention of hemorrhage)    DJD (degenerative joint disease)    knees/hands/feet/back/neck   Fatty liver    Full dentures    GERD (gastroesophageal reflux disease)    H/O hiatal hernia    History of  blood transfusion    with a past surical procedure per patient 06/01/20   Hyperlipidemia    Hypertension    Neuromuscular disorder ([place])    peripheral neuropathy   Non-pressure chronic ulcer of other part of left foot limited to breakdown of skin ([place]) 11/12/2016   Osteomyelitis ([facility])    left 5th metatarsal   PAD (peripheral artery disease) ([place])    Distal aortogram June 2012. Atherectomy left popliteal artery July 2012.    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 Before age 42    Hyperlipidemia Brother    Hypertension Brother    Cancer Sister        Breast   Hyperlipidemia Sister    Hypertension Sister    Hypertension Son    Arthritis Other        GRANDMOTHER   Hypertension Other        OTHER FAMILY MEMBERS    Past Surgical History:  Procedure Laterality Date   ABDOMINAL AORTOGRAM W/LOWER EXTREMITY N/A 06/23/2021   Procedure: ABDOMINAL AORTOGRAM W/LOWER EXTREMITY;  Surgeon: Nigel Mormon, MD;  Location: [facility];  Service: Cardiovascular;  Laterality: N/A;   AMPUTATION  11/05/2011   Procedure: AMPUTATION RAY;  Surgeon: Wylene Simmer, MD;  Location: [facility];  Service: Orthopedics;  Laterality: Right;  Amputation of Right 4&5th Toes   AMPUTATION Left 11/26/2012   Procedure: AMPUTATION RAY;  Surgeon: Wylene Simmer, MD;  Location: [facility];  Service: Orthopedics;  Laterality: Left;  fourth ray amputation   AMPUTATION Right 08/27/2014   Procedure: Transmetatarsal Amputation;  Surgeon: Newt Minion, MD;  Location: [facility];  Service: Orthopedics;  Laterality: Right;   AMPUTATION Right 01/14/2015   Procedure: AMPUTATION BELOW KNEE;  Surgeon: Newt Minion, MD;  Location: [facility];  Service: Orthopedics;  Laterality: Right;   AMPUTATION Left 10/21/2015   Procedure: Left Foot 5th Ray Amputation;  Surgeon: Newt Minion, MD;  Location: [facility];  Service: Orthopedics;  Laterality: Left;   ANTERIOR FUSION CERVICAL SPINE  02/06/06   C4-5, C5-6, C6-7; SURGEON DR. MAX COHEN   AV FISTULA PLACEMENT Left 06/02/2020   Procedure: ARTERIOVENOUS (AV) FISTULA CREATION LEFT;  Surgeon: Waynetta Sandy, MD;  Location: [facility];  Service: Vascular;  Laterality: Left;   BACK SURGERY     x 3   [place] Left 07/21/2020   Procedure: LEFT UPPER ARM ATERIOVENOUS SUPERFISTULALIZATION;  Surgeon: Waynetta Sandy, MD;  Location: [facility];  Service: Vascular;  Laterality: Left;   BELOW KNEE LEG AMPUTATION Right    CARDIAC CATHETERIZATION  10/31/04   2009   CAROTID  ENDARTERECTOMY  11/10/10  CAROTID ENDARTERECTOMY Left 11/10/2010   Subtotal occlusion of left internal carotid artery with left hemispheric transient ischemic attacks.   CAROTID STENT     CARPAL TUNNEL RELEASE Right 10/21/2013   Procedure: RIGHT CARPAL TUNNEL RELEASE;  Surgeon: Wynonia Sours, MD;  Location: [facility];  Service: Orthopedics;  Laterality: Right;   CHOLECYSTECTOMY     COLON SURGERY     COLONOSCOPY     COLOSTOMY REVERSAL  05/21/2018   ileostomy reversal   CYSTOSCOPY WITH STENT PLACEMENT Bilateral 01/13/2018   Procedure: CYSTOSCOPY WITH BILATERAL URETERAL CATHETER PLACEMENT;  Surgeon: Ardis Hughs, MD;  Location: [facility];  Service: Urology;  Laterality: Bilateral;   ESOPHAGEAL MANOMETRY Bilateral 07/19/2014   Procedure: ESOPHAGEAL MANOMETRY (EM);  Surgeon: Jerene Bears, MD;  [facility];  Service: Gastroenterology;  Laterality: Bilateral;   EYE SURGERY Bilateral 2020   cataract   FEMORAL ARTERY STENT     x6   FINGER SURGERY     FOOT SURGERY  04/25/2016    EXCISION BASE 5TH METATARSAL AND PARTIAL CUBOID LEFT FOOT   HERNIA REPAIR     LEFT INGUINAL AND UMBILICAL REPAIRS   HERNIA REPAIR     I & D EXTREMITY Left 04/25/2016   Procedure: EXCISION BASE 5TH METATARSAL AND PARTIAL CUBOID LEFT FOOT;  Surgeon: Newt Minion, MD;  Location: [facility];  Service: Orthopedics;  Laterality: Left;   ILEOSTOMY  01/13/2018   Procedure: ILEOSTOMY;  Surgeon: Clovis Riley, MD;  Location[facility];  Service: General;;   ILEOSTOMY CLOSURE N/A 05/21/2018   Procedure: ILEOSTOMY REVERSAL ERAS PATHWAY;  Surgeon: Clovis Riley, MD;  Location: [facility];  Service: General;  Laterality: N/A;   IR RADIOLOGIST EVAL & MGMT  11/19/2017   IR RADIOLOGIST EVAL & MGMT  12/03/2017   IR RADIOLOGIST EVAL & MGMT  12/18/2017   JOINT REPLACEMENT Right 2001   Total knee   LAMINECTOMY     X 3 LUMBAR AND X 2 CERVICAL SPINE OPERATIONS   LAPAROSCOPIC CHOLECYSTECTOMY W/ CHOLANGIOGRAPHY  11/09/04    SURGEON DR. [place] CATH AND CORONARY ANGIOGRAPHY N/A 09/16/2018   Procedure: LEFT HEART CATH AND CORONARY ANGIOGRAPHY;  Surgeon: Nigel Mormon, MD;  Location: [facility];  Service: Cardiovascular;  Laterality: N/A;   LEFT HEART CATHETERIZATION WITH CORONARY ANGIOGRAM N/A 10/29/2014   Procedure: LEFT HEART CATHETERIZATION WITH CORONARY ANGIOGRAM;  Surgeon: Laverda Page, MD;  Location: [facility];  Service: Cardiovascular;  Laterality: N/A;   LIGATION OF COMPETING BRANCHES OF ARTERIOVENOUS FISTULA Left 07/21/2020   Procedure: LIGATION OF COMPETING BRANCHES OF LEFT UPPER ARM ARTERIOVENOUS FISTULA;  Surgeon: Waynetta Sandy, MD;  Location: [facility];  Service: Vascular;  Laterality: Left;   LOWER EXTREMITY ANGIOGRAM N/A 03/19/2012   Procedure: LOWER EXTREMITY ANGIOGRAM;  Surgeon: Burnell Blanks, MD;  Location: [facility];  Service: Cardiovascular;  Laterality: N/A;   LOWER EXTREMITY ANGIOGRAPHY N/A 06/20/2021   Procedure: LOWER EXTREMITY ANGIOGRAPHY;  Surgeon: Nigel Mormon, MD;  Location: [facility];  Service: Cardiovascular;  Laterality: N/A;   NECK SURGERY     PARTIAL COLECTOMY N/A 01/13/2018   Procedure: LAPAROSCOPIC ASSISTED   SIGMOID COLECTOMY ILEOSTOMY;  Surgeon: Clovis Riley, MD;  Location[facility];  Service: General;  Laterality: N/A;   PENILE PROSTHESIS IMPLANT  08/14/05   INFRAPUBIC INSERTION OF INFLATABLE PENILE PROSTHESIS; SURGEON DR. Amalia Hailey   PENILE PROSTHESIS IMPLANT  PERCUTANEOUS CORONARY STENT INTERVENTION (PCI-S) Right 10/29/2014   Procedure: PERCUTANEOUS CORONARY STENT INTERVENTION (PCI-S);  Surgeon: Laverda Page, MD;  Location: [facility];  Service: Cardiovascular;  Laterality: Right;   PERIPHERAL VASCULAR INTERVENTION Left 06/23/2021   Procedure: PERIPHERAL VASCULAR INTERVENTION;  Surgeon: Nigel Mormon, MD;  Location: [facility];  Service: Cardiovascular;  Laterality: Left;   REMOVAL OF  PENILE PROSTHESIS N/A 06/14/2021   Procedure: Removal of THREE piece inflatable penile prosthesis;  Surgeon: Lucas Mallow, MD;  Location: [facility];  Service: Urology;  Laterality: N/A;   SHOULDER ARTHROSCOPY     SPINE SURGERY     TOE AMPUTATION Left    TONSILLECTOMY     TOTAL KNEE ARTHROPLASTY  07/2002   RIGHT KNEE ; SURGEON  DR. [name] ALSO HAD ARTHROSCOPIC RIGHT KNEE IN  10/2001   TOTAL KNEE ARTHROPLASTY     ULNAR NERVE TRANSPOSITION Right 10/21/2013   Procedure: RIGHT ELBOW  ULNAR NERVE DECOMPRESSION;  Surgeon: Wynonia Sours, MD;  Location: [facility];  Service: Orthopedics;  Laterality: Right;   Social History   Occupational History   Occupation: Magazine features editor: UNEMPLOYED  Tobacco Use   Smoking status: Former    Packs/day: 2.00    Years: 35.00    Pack years: 70.00    Types: Cigarettes    Quit date: 10/28/2011    Years since quitting: 9.9   Smokeless tobacco: Never  Vaping Use   Vaping Use: Some days   Substances: Nicotine  Substance and Sexual Activity   Alcohol use: Not Currently    Comment: "not in a long time"   Drug use: Never   Sexual activity: Yes    Birth control/protection: Implant    Comment: penile implant

## 2021-10-13 NOTE — Progress Notes (Signed)
Office Visit Note   Patient: [name]           Date of Birth: [birth year]           MRN: [id number] Visit Date: 10/06/2021              Requested by: [name], PA-C [address] [place],  [place] [address] PCP: [name], PA-C  Chief Complaint  Patient presents with   Left Leg - Follow-up      HPI: Patient is a 66 year old gentleman who is seen in follow-up for ischemic ulcer and venous insufficiency of the left lower extremity. he is currently undergoing serial compression wraps.  Has been resistant to transition to the compression stocking  We had tried using Iodosorb over the wound   Assessment & Plan: Visit Diagnoses:  No diagnosis found.    Plan: Did have some worsening of his ulcer.  We will resume silver cell and stop the Iodosorb.  Compression wrap on the left.   Follow-Up Instructions: No follow-ups on file.   Ortho Exam  Patient is alert, oriented, no adenopathy, well-dressed, normal affect, normal respiratory effort. Examination the leg has continued decreased swelling.  No new epithelialization to the wound over the lateral aspect the left foot.  This continues with has fibrinous exudative tissue. there is no cellulitis odor or drainage.  This does not probe to bone  The wound is stable in size there is now surrounding maceration Imaging: No results found. No images are attached to the encounter.  Labs: Lab Results  Component Value Date   HGBA1C 7.9 (H) 06/14/2021   HGBA1C 5.7 (H) 09/16/2018   HGBA1C 7.7 (H) 05/15/2018   ESRSEDRATE 40 (H) 09/03/2021   ESRSEDRATE 17 (H) 06/24/2021   ESRSEDRATE 35 (H) 09/03/2016   CRP 2.7 (H) 09/07/2021   CRP 6.4 (H) 09/06/2021   CRP 8.4 (H) 09/05/2021   REPTSTATUS 09/07/2021 FINAL 09/02/2021   GRAMSTAIN  06/14/2021    NO ORGANISMS SEEN SQUAMOUS EPITHELIAL CELLS PRESENT ABUNDANT WBC PRESENT,BOTH PMN AND MONONUCLEAR MODERATE GRAM POSITIVE COCCI    CULT  09/02/2021    NO GROWTH 5  DAYS Performed at [facility], [place] [address], [place], [place] [address]    LABORGA ENTEROCOCCUS FAECALIS 06/14/2021     Lab Results  Component Value Date   ALBUMIN 3.0 (L) 09/07/2021   ALBUMIN 3.1 (L) 09/06/2021   ALBUMIN 2.7 (L) 09/05/2021   PREALBUMIN 19.0 05/04/2016   PREALBUMIN 25.0 12/16/2015    Lab Results  Component Value Date   MG 1.9 05/24/2018   MG 2.1 05/23/2018   MG 1.3 (L) 05/22/2018   No results found for: VD25OH  Lab Results  Component Value Date   PREALBUMIN 19.0 05/04/2016   PREALBUMIN 25.0 12/16/2015   CBC EXTENDED Latest Ref Rng & Units 09/07/2021 09/06/2021 09/05/2021  WBC 4.0 - 10.5 K/uL 4.8 2.3(L) 3.8(L)  RBC 4.22 - 5.81 MIL/uL 3.72(L) 3.96(L) 3.35(L)  HGB 13.0 - 17.0 g/dL 11.1(L) 11.9(L) 10.2(L)  HCT 39.0 - 52.0 % 33.0(L) 34.9(L) 30.1(L)  PLT 150 - 400 K/uL 151 117(L) 103(L)  NEUTROABS 1.7 - 7.7 K/uL 3.6 1.9 2.1  LYMPHSABS 0.7 - 4.0 K/uL 0.8 0.3(L) 1.1     There is no height or weight on file to calculate BMI.  Orders:  No orders of the defined types were placed in this encounter.  No orders of the defined types were placed in this encounter.    Procedures: No procedures  performed  Clinical Data: No additional findings.  ROS:  All other systems negative, except as noted in the HPI. Review of Systems  Objective: Vital Signs: There were no vitals taken for this visit.  Specialty Comments:  No specialty comments available.  PMFS History: Patient Active Problem List   Diagnosis Date Noted   Acute metabolic encephalopathy 95/62/1308   COVID 09/02/2021   Sepsis ([place]) 09/02/2021   Severe sepsis with lactic acidosis (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   Pseudoclaudication 11/15/2018   Essential hypertension 09/18/2018   S/P BKA (below knee amputation), right ([place]) 09/18/2018   Amputation of toe of left foot ([place]) 09/18/2018   Peripheral artery disease ([place]) 09/18/2018   S/P carotid endarterectomy 09/18/2018   OSA on CPAP 09/18/2018   Chronic back pain 09/18/2018   Status post reversal of ileostomy 05/21/2018   Normocytic anemia 02/14/2018   Intra-abdominal abscess ([place]) 11/04/2017   Chronic venous hypertension (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 ([place]) 12/11/2016   Ischemic ulcer of left foot, limited to breakdown of skin ([place]) 11/12/2016   Unilateral primary osteoarthritis, left knee 10/11/2016   History of right below knee amputation ([place]) 07/12/2016   Acute osteomyelitis of left foot ([place])    Diabetic foot infection ([place]) 05/04/2016   Insulin dependent type 2 diabetes mellitus ([place]) 12/16/2015   Amputated toe ([place]) 10/21/2015   Leg edema, left 09/03/2015   CAD (dz of distal, mid and proximal RCA with implantation of 3 overlapping drug-eluting stent,) 09/03/2015   Chronic diastolic heart failure, NYHA class 2 ([place]) 09/03/2015   Angina pectoris associated with type 2 diabetes mellitus ([place]) 10/28/2014   Hyperlipidemia 08/25/2014   Limb pain 03/20/2013   DJD (degenerative joint disease) 09/25/2012   Migraine 09/25/2012   Neuropathy 09/25/2012   Restless legs syndrome (RLS) 09/25/2012   Chronic obstructive pulmonary disease, unspecified ([place]) 04/25/2012   Unknown cause of morbidity or mortality 04/25/2012   Chronic total occlusion of artery of the extremities ([place]) 04/08/2012   Onychomycosis 02/01/2012    Occlusion and stenosis of carotid artery without mention of cerebral infarction 06/12/2011   GERD (gastroesophageal reflux disease) 05/08/2011   Barrett's esophagus without dysplasia 05/08/2011   Former tobacco use 02/01/2011   Past Medical History:  Diagnosis Date   Carotid artery occlusion 11/10/10   LEFT CAROTID ENDARTERECTOMY   Chronic kidney disease    Complication of anesthesia    BP WENT UP AT [facility] "   COPD (chronic obstructive pulmonary disease) ([place])    pt denies this dx as of 06/01/20 - no inhaler    Diabetes mellitus without complication ([place])    Diverticulitis    Diverticulosis of colon (without mention of hemorrhage)    DJD (degenerative joint disease)    knees/hands/feet/back/neck   Fatty liver    Full dentures    GERD (gastroesophageal reflux disease)    H/O hiatal hernia    History of blood transfusion    with a past surical procedure per patient  06/01/20   Hyperlipidemia    Hypertension    Neuromuscular disorder ([place])    peripheral neuropathy   Non-pressure chronic ulcer of other part of left foot limited to breakdown of skin ([place]) 11/12/2016   Osteomyelitis ([facility])    left 5th metatarsal   PAD (peripheral artery disease) ([place])    Distal aortogram June 2012. Atherectomy left popliteal artery July 2012.    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 Before age 20   Hyperlipidemia Brother    Hypertension Brother    Cancer  Sister        Breast   Hyperlipidemia Sister    Hypertension Sister    Hypertension Son    Arthritis Other        GRANDMOTHER   Hypertension Other        OTHER FAMILY MEMBERS    Past Surgical History:  Procedure Laterality Date   ABDOMINAL AORTOGRAM W/LOWER EXTREMITY N/A 06/23/2021   Procedure: ABDOMINAL AORTOGRAM W/LOWER EXTREMITY;  Surgeon: Nigel Mormon, MD;  Location: [facility];  Service: Cardiovascular;  Laterality: N/A;   AMPUTATION  11/05/2011   Procedure: AMPUTATION RAY;  Surgeon: Wylene Simmer, MD;  Location: [facility];  Service: Orthopedics;  Laterality: Right;  Amputation of Right 4&5th Toes   AMPUTATION Left 11/26/2012   Procedure: AMPUTATION RAY;  Surgeon: Wylene Simmer, MD;  Location: [facility];  Service: Orthopedics;  Laterality: Left;  fourth ray amputation   AMPUTATION Right 08/27/2014   Procedure: Transmetatarsal Amputation;  Surgeon: Newt Minion, MD;  Location: [facility];  Service: Orthopedics;  Laterality: Right;   AMPUTATION Right 01/14/2015   Procedure: AMPUTATION BELOW KNEE;  Surgeon: Newt Minion, MD;  Location: [facility];  Service: Orthopedics;  Laterality: Right;   AMPUTATION Left 10/21/2015   Procedure: Left Foot 5th Ray Amputation;  Surgeon: Newt Minion, MD;  Location: [facility];  Service: Orthopedics;  Laterality: Left;   ANTERIOR FUSION CERVICAL SPINE  02/06/06   C4-5, C5-6, C6-7; SURGEON DR. MAX COHEN   AV FISTULA PLACEMENT Left 06/02/2020   Procedure: ARTERIOVENOUS (AV) FISTULA CREATION LEFT;  Surgeon: Waynetta Sandy, MD;  Location: [facility];  Service: Vascular;  Laterality: Left;   BACK SURGERY     x 3   [place] Left 07/21/2020   Procedure: LEFT UPPER ARM ATERIOVENOUS SUPERFISTULALIZATION;  Surgeon: Waynetta Sandy, MD;  Location: [facility];  Service: Vascular;  Laterality: Left;   BELOW KNEE LEG AMPUTATION Right    CARDIAC CATHETERIZATION  10/31/04   2009   CAROTID ENDARTERECTOMY  11/10/10   CAROTID ENDARTERECTOMY Left  11/10/2010   Subtotal occlusion of left  internal carotid artery with left hemispheric transient ischemic attacks.   CAROTID STENT     CARPAL TUNNEL RELEASE Right 10/21/2013   Procedure: RIGHT CARPAL TUNNEL RELEASE;  Surgeon: Wynonia Sours, MD;  Location: [facility];  Service: Orthopedics;  Laterality: Right;   CHOLECYSTECTOMY     COLON SURGERY     COLONOSCOPY     COLOSTOMY REVERSAL  05/21/2018   ileostomy reversal   CYSTOSCOPY WITH STENT PLACEMENT Bilateral 01/13/2018   Procedure: CYSTOSCOPY WITH BILATERAL URETERAL CATHETER PLACEMENT;  Surgeon: Ardis Hughs, MD;  Location: [facility];  Service: Urology;  Laterality: Bilateral;   ESOPHAGEAL MANOMETRY Bilateral 07/19/2014   Procedure: ESOPHAGEAL MANOMETRY (EM);  Surgeon: Jerene Bears, MD;  [facility];  Service: Gastroenterology;  Laterality: Bilateral;   EYE SURGERY Bilateral 2020   cataract   FEMORAL ARTERY STENT     x6   FINGER SURGERY     FOOT SURGERY  04/25/2016    EXCISION BASE 5TH METATARSAL AND PARTIAL CUBOID LEFT FOOT   HERNIA REPAIR     LEFT INGUINAL AND UMBILICAL REPAIRS   HERNIA REPAIR     I & D EXTREMITY Left 04/25/2016   Procedure: EXCISION BASE 5TH METATARSAL AND PARTIAL CUBOID LEFT FOOT;  Surgeon: Newt Minion, MD;  Location: [facility];  Service: Orthopedics;  Laterality: Left;   ILEOSTOMY  01/13/2018   Procedure: ILEOSTOMY;  Surgeon: Clovis Riley, MD;  Location: [facility];  Service: General;;   ILEOSTOMY CLOSURE N/A 05/21/2018   Procedure: ILEOSTOMY REVERSAL ERAS PATHWAY;  Surgeon: Clovis Riley, MD;  Location: [facility];  Service: General;  Laterality: N/A;   IR RADIOLOGIST EVAL & MGMT  11/19/2017   IR RADIOLOGIST EVAL & MGMT  12/03/2017   IR RADIOLOGIST EVAL & MGMT  12/18/2017   JOINT REPLACEMENT Right 2001   Total knee   LAMINECTOMY     X 3 LUMBAR AND X 2 CERVICAL SPINE OPERATIONS   LAPAROSCOPIC CHOLECYSTECTOMY W/ CHOLANGIOGRAPHY  11/09/04   SURGEON DR. [place] CATH AND CORONARY  ANGIOGRAPHY N/A 09/16/2018   Procedure: LEFT HEART CATH AND CORONARY ANGIOGRAPHY;  Surgeon: Nigel Mormon, MD;  Location: [facility];  Service: Cardiovascular;  Laterality: N/A;   LEFT HEART CATHETERIZATION WITH CORONARY ANGIOGRAM N/A 10/29/2014   Procedure: LEFT HEART CATHETERIZATION WITH CORONARY ANGIOGRAM;  Surgeon: Laverda Page, MD;  Location: [facility];  Service: Cardiovascular;  Laterality: N/A;   LIGATION OF COMPETING BRANCHES OF ARTERIOVENOUS FISTULA Left 07/21/2020   Procedure: LIGATION OF COMPETING BRANCHES OF LEFT UPPER ARM ARTERIOVENOUS FISTULA;  Surgeon: Waynetta Sandy, MD;  Location: [facility];  Service: Vascular;  Laterality: Left;   LOWER EXTREMITY ANGIOGRAM N/A 03/19/2012   Procedure: LOWER EXTREMITY ANGIOGRAM;  Surgeon: Burnell Blanks, MD;  Location[facility];  Service: Cardiovascular;  Laterality: N/A;   LOWER EXTREMITY ANGIOGRAPHY N/A 06/20/2021   Procedure: LOWER EXTREMITY ANGIOGRAPHY;  Surgeon: Nigel Mormon, MD;  Location: [facility];  Service: Cardiovascular;  Laterality: N/A;   NECK SURGERY     PARTIAL COLECTOMY N/A 01/13/2018   Procedure: LAPAROSCOPIC ASSISTED   SIGMOID COLECTOMY ILEOSTOMY;  Surgeon: Clovis Riley, MD;  Location[facility];  Service: General;  Laterality: N/A;   PENILE PROSTHESIS IMPLANT  08/14/05   INFRAPUBIC INSERTION OF INFLATABLE PENILE PROSTHESIS; SURGEON DR. Amalia Hailey   PENILE PROSTHESIS IMPLANT     PERCUTANEOUS CORONARY STENT INTERVENTION (PCI-S) Right 10/29/2014  Procedure: PERCUTANEOUS CORONARY STENT INTERVENTION (PCI-S);  Surgeon: Laverda Page, MD;  Location: [facility];  Service: Cardiovascular;  Laterality: Right;   PERIPHERAL VASCULAR INTERVENTION Left 06/23/2021   Procedure: PERIPHERAL VASCULAR INTERVENTION;  Surgeon: Nigel Mormon, MD;  Location: [facility];  Service: Cardiovascular;  Laterality: Left;   REMOVAL OF PENILE PROSTHESIS N/A 06/14/2021   Procedure: Removal of  THREE piece inflatable penile prosthesis;  Surgeon: Lucas Mallow, MD;  Location: [facility];  Service: Urology;  Laterality: N/A;   SHOULDER ARTHROSCOPY     SPINE SURGERY     TOE AMPUTATION Left    TONSILLECTOMY     TOTAL KNEE ARTHROPLASTY  07/2002   RIGHT KNEE ; SURGEON  DR. [name] ALSO HAD ARTHROSCOPIC RIGHT KNEE IN  10/2001   TOTAL KNEE ARTHROPLASTY     ULNAR NERVE TRANSPOSITION Right 10/21/2013   Procedure: RIGHT ELBOW  ULNAR NERVE DECOMPRESSION;  Surgeon: Wynonia Sours, MD;  Location: [facility];  Service: Orthopedics;  Laterality: Right;   Social History   Occupational History   Occupation: Magazine features editor: UNEMPLOYED  Tobacco Use   Smoking status: Former    Packs/day: 2.00    Years: 35.00    Pack years: 70.00    Types: Cigarettes    Quit date: 10/28/2011    Years since quitting: 9.9   Smokeless tobacco: Never  Vaping Use   Vaping Use: Some days   Substances: Nicotine  Substance and Sexual Activity   Alcohol use: Not Currently    Comment: "not in a long time"   Drug use: Never   Sexual activity: Yes    Birth control/protection: Implant    Comment: penile implant

## 2021-10-16 ENCOUNTER — Other Ambulatory Visit: Payer: Self-pay

## 2021-10-16 ENCOUNTER — Encounter: Payer: Self-pay | Admitting: Orthopedic Surgery

## 2021-10-16 ENCOUNTER — Ambulatory Visit (INDEPENDENT_AMBULATORY_CARE_PROVIDER_SITE_OTHER): Payer: 59 | Admitting: Orthopedic Surgery

## 2021-10-16 DIAGNOSIS — L97521 Non-pressure chronic ulcer of other part of left foot limited to breakdown of skin: Secondary | ICD-10-CM | POA: Diagnosis not present

## 2021-10-17 ENCOUNTER — Encounter: Payer: Self-pay | Admitting: Orthopedic Surgery

## 2021-10-17 NOTE — Progress Notes (Signed)
Office Visit Note   Patient: [name]           Date of Birth: Jul 07, 1956           MRN: [id number] Visit Date: 10/16/2021              Requested by: [name], PA-C [address] [place],  [place] [address] PCP: [name], PA-C  Chief Complaint  Patient presents with   Left Foot - Follow-up    Foot ulcer      HPI: Patient is a 66 year old gentleman who presents in follow-up for ischemic ulcer lateral aspect of the left foot.  Patient is status post revascularization of the left anterior tibial artery with an endovascular procedure in October.  Assessment & Plan: Visit Diagnoses:  1. Ischemic ulcer of left foot, limited to breakdown of skin ([place])     Plan: Patient is showing good interval wound healing we will continue with current wound care compression wraps.  Follow-Up Instructions: Return in about 1 week (around 10/23/2021).   Ortho Exam  Patient is alert, oriented, no adenopathy, well-dressed, normal affect, normal respiratory effort. Examination patient just avulsed the third toenail off in the office.  Examination the lateral aspect the left foot there is healthy granulation tissue in the wound bed.  The ulcers now measures 2 cm and 1 cm diameter.  3 layer compression wrap was applied with silver cell.  Imaging: No results found. No images are attached to the encounter.  Labs: Lab Results  Component Value Date   HGBA1C 7.9 (H) 06/14/2021   HGBA1C 5.7 (H) 09/16/2018   HGBA1C 7.7 (H) 05/15/2018   ESRSEDRATE 40 (H) 09/03/2021   ESRSEDRATE 17 (H) 06/24/2021   ESRSEDRATE 35 (H) 09/03/2016   CRP 2.7 (H) 09/07/2021   CRP 6.4 (H) 09/06/2021   CRP 8.4 (H) 09/05/2021   REPTSTATUS 09/07/2021 FINAL 09/02/2021   GRAMSTAIN  06/14/2021    NO ORGANISMS SEEN SQUAMOUS EPITHELIAL CELLS PRESENT ABUNDANT WBC PRESENT,BOTH PMN AND MONONUCLEAR MODERATE GRAM POSITIVE COCCI    CULT  09/02/2021    NO GROWTH 5 DAYS Performed at [facility], [place] [address] [place], [place] [address]    LABORGA ENTEROCOCCUS FAECALIS 06/14/2021     Lab Results  Component Value Date   ALBUMIN 3.0 (L) 09/07/2021   ALBUMIN 3.1 (L) 09/06/2021   ALBUMIN 2.7 (L) 09/05/2021   PREALBUMIN 19.0 05/04/2016   PREALBUMIN 25.0 12/16/2015    Lab Results  Component Value Date   MG 1.9 05/24/2018   MG 2.1 05/23/2018   MG 1.3 (L) 05/22/2018   No results found for: VD25OH  Lab Results  Component Value Date   PREALBUMIN 19.0 05/04/2016   PREALBUMIN 25.0 12/16/2015   CBC EXTENDED Latest Ref Rng & Units 09/07/2021 09/06/2021 09/05/2021  WBC 4.0 - 10.5 K/uL 4.8 2.3(L) 3.8(L)  RBC 4.22 - 5.81 MIL/uL 3.72(L) 3.96(L) 3.35(L)  HGB 13.0 - 17.0 g/dL 11.1(L) 11.9(L) 10.2(L)  HCT 39.0 - 52.0 % 33.0(L) 34.9(L) 30.1(L)  PLT 150 - 400 K/uL 151 117(L) 103(L)  NEUTROABS 1.7 - 7.7 K/uL 3.6 1.9 2.1  LYMPHSABS 0.7 - 4.0 K/uL 0.8 0.3(L) 1.1     There is no height or weight on file to calculate BMI.  Orders:  No orders of the defined types were placed in this encounter.  No orders of the defined types were placed in this encounter.    Procedures: No procedures performed  Clinical Data: No additional findings.  ROS:  All other systems negative, except as noted in the HPI. Review of Systems  Objective: Vital Signs: There were no vitals taken for this visit.  Specialty Comments:  No specialty comments available.  PMFS History: Patient Active Problem List   Diagnosis Date Noted   Acute metabolic encephalopathy 76/54/6503   COVID 09/02/2021   Sepsis ([place]) 09/02/2021   Severe sepsis with lactic acidosis ([place]) 06/14/2021   Infection associated with implanted penile prosthesis ([place]) 06/14/2021   ESRD on hemodialysis (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   Pseudoclaudication 11/15/2018   Essential hypertension 09/18/2018   S/P BKA (below knee amputation), right ([place]) 09/18/2018   Amputation of toe of left foot ([place]) 09/18/2018   Peripheral artery disease ([place]) 09/18/2018   S/P carotid endarterectomy 09/18/2018   OSA on CPAP 09/18/2018   Chronic back pain 09/18/2018   Status post reversal of ileostomy 05/21/2018   Normocytic anemia 02/14/2018   Intra-abdominal abscess ([place]) 11/04/2017   Chronic venous hypertension (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 ([place]) 12/11/2016   Ischemic ulcer of left foot, limited to breakdown of skin ([place]) 11/12/2016   Unilateral primary osteoarthritis, left knee 10/11/2016   History of right below knee amputation ([place]) 07/12/2016   Acute osteomyelitis of left foot ([place])    Diabetic foot infection ([place]) 05/04/2016   Insulin dependent type 2 diabetes mellitus ([place]) 12/16/2015   Amputated toe ([place]) 10/21/2015   Leg edema, left 09/03/2015   CAD (dz of distal, mid and proximal RCA with implantation of 3 overlapping drug-eluting stent,) 09/03/2015   Chronic diastolic heart failure, NYHA class 2 ([place]) 09/03/2015   Angina pectoris associated with type 2 diabetes mellitus ([place]) 10/28/2014   Hyperlipidemia 08/25/2014   Limb pain 03/20/2013   DJD (degenerative joint disease) 09/25/2012   Migraine 09/25/2012   Neuropathy 09/25/2012   Restless legs syndrome (RLS) 09/25/2012   Chronic obstructive pulmonary disease, unspecified ([place]) 04/25/2012   Unknown cause of morbidity or mortality 04/25/2012   Chronic total occlusion of artery of the extremities ([place]) 04/08/2012   Onychomycosis 02/01/2012   Occlusion and stenosis of carotid artery without mention of  cerebral infarction 06/12/2011   GERD (gastroesophageal reflux disease) 05/08/2011   Barrett's esophagus without dysplasia 05/08/2011   Former tobacco use 02/01/2011   Past Medical History:  Diagnosis Date   Carotid artery occlusion 11/10/10   LEFT CAROTID ENDARTERECTOMY   Chronic kidney disease    Complication of anesthesia    BP WENT UP AT [facility] "   COPD (chronic obstructive pulmonary disease) ([place])    pt denies this dx as of 06/01/20 - no inhaler    Diabetes mellitus without complication ([place])    Diverticulitis    Diverticulosis of colon (without mention of hemorrhage)    DJD (degenerative joint disease)    knees/hands/feet/back/neck   Fatty liver    Full dentures    GERD (gastroesophageal reflux disease)    H/O hiatal hernia    History of blood transfusion    with a past surical procedure per patient 06/01/20   Hyperlipidemia    Hypertension  Neuromuscular disorder ([place])    peripheral neuropathy   Non-pressure chronic ulcer of other part of left foot limited to breakdown of skin ([place]) 11/12/2016   Osteomyelitis ([facility])    left 5th metatarsal   PAD (peripheral artery disease) ([place])    Distal aortogram June 2012. Atherectomy left popliteal artery July 2012.    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 Before age 78   Hyperlipidemia Brother    Hypertension Brother    Cancer Sister        Breast   Hyperlipidemia Sister     Hypertension Sister    Hypertension Son    Arthritis Other        GRANDMOTHER   Hypertension Other        OTHER FAMILY MEMBERS    Past Surgical History:  Procedure Laterality Date   ABDOMINAL AORTOGRAM W/LOWER EXTREMITY N/A 06/23/2021   Procedure: ABDOMINAL AORTOGRAM W/LOWER EXTREMITY;  Surgeon: Nigel Mormon, MD;  Location: [facility];  Service: Cardiovascular;  Laterality: N/A;   AMPUTATION  11/05/2011   Procedure: AMPUTATION RAY;  Surgeon: Wylene Simmer, MD;  Location: [facility];  Service: Orthopedics;  Laterality: Right;  Amputation of Right 4&5th Toes   AMPUTATION Left 11/26/2012   Procedure: AMPUTATION RAY;  Surgeon: Wylene Simmer, MD;  Location: [facility];  Service: Orthopedics;  Laterality: Left;  fourth ray amputation   AMPUTATION Right 08/27/2014   Procedure: Transmetatarsal Amputation;  Surgeon: Newt Minion, MD;  Location: [facility];  Service: Orthopedics;  Laterality: Right;   AMPUTATION Right 01/14/2015   Procedure: AMPUTATION BELOW KNEE;  Surgeon: Newt Minion, MD;  Location: [facility];  Service: Orthopedics;  Laterality: Right;   AMPUTATION Left 10/21/2015   Procedure: Left Foot 5th Ray Amputation;  Surgeon: Newt Minion, MD;  Location: [facility];  Service: Orthopedics;  Laterality: Left;   ANTERIOR FUSION CERVICAL SPINE  02/06/06   C4-5, C5-6, C6-7; SURGEON DR. MAX COHEN   AV FISTULA PLACEMENT Left 06/02/2020   Procedure: ARTERIOVENOUS (AV) FISTULA CREATION LEFT;  Surgeon: Waynetta Sandy, MD;  Location: [facility];  Service: Vascular;  Laterality: Left;   BACK SURGERY     x 3   [place] Left 07/21/2020   Procedure: LEFT UPPER ARM ATERIOVENOUS SUPERFISTULALIZATION;  Surgeon: Waynetta Sandy, MD;  Location: [facility];  Service: Vascular;  Laterality: Left;   BELOW KNEE LEG AMPUTATION Right    CARDIAC CATHETERIZATION  10/31/04   2009   CAROTID ENDARTERECTOMY  11/10/10   CAROTID ENDARTERECTOMY Left 11/10/2010   Subtotal occlusion of left internal carotid  artery with left hemispheric transient ischemic attacks.  CAROTID STENT     CARPAL TUNNEL RELEASE Right 10/21/2013   Procedure: RIGHT CARPAL TUNNEL RELEASE;  Surgeon: Wynonia Sours, MD;  Location: [facility];  Service: Orthopedics;  Laterality: Right;   CHOLECYSTECTOMY     COLON SURGERY     COLONOSCOPY     COLOSTOMY REVERSAL  05/21/2018   ileostomy reversal   CYSTOSCOPY WITH STENT PLACEMENT Bilateral 01/13/2018   Procedure: CYSTOSCOPY WITH BILATERAL URETERAL CATHETER PLACEMENT;  Surgeon: Ardis Hughs, MD;  Location: [facility];  Service: Urology;  Laterality: Bilateral;   ESOPHAGEAL MANOMETRY Bilateral 07/19/2014   Procedure: ESOPHAGEAL MANOMETRY (EM);  Surgeon: Jerene Bears, MD;  [facility];  Service: Gastroenterology;  Laterality: Bilateral;   EYE SURGERY Bilateral 2020   cataract   FEMORAL ARTERY STENT     x6   FINGER SURGERY     FOOT SURGERY  04/25/2016    EXCISION BASE 5TH METATARSAL AND PARTIAL CUBOID LEFT FOOT   HERNIA REPAIR     LEFT INGUINAL AND UMBILICAL REPAIRS   HERNIA REPAIR     I & D EXTREMITY Left 04/25/2016   Procedure: EXCISION BASE 5TH METATARSAL AND PARTIAL CUBOID LEFT FOOT;  Surgeon: Newt Minion, MD;  Location: [facility];  Service: Orthopedics;  Laterality: Left;   ILEOSTOMY  01/13/2018   Procedure: ILEOSTOMY;  Surgeon: Clovis Riley, MD;  Location: [facility];  Service: General;;   ILEOSTOMY CLOSURE N/A 05/21/2018   Procedure: ILEOSTOMY REVERSAL ERAS PATHWAY;  Surgeon: Clovis Riley, MD;  Location: [facility];  Service: General;  Laterality: N/A;   IR RADIOLOGIST EVAL & MGMT  11/19/2017   IR RADIOLOGIST EVAL & MGMT  12/03/2017   IR RADIOLOGIST EVAL & MGMT  12/18/2017   JOINT REPLACEMENT Right 2001   Total knee   LAMINECTOMY     X 3 LUMBAR AND X 2 CERVICAL SPINE OPERATIONS   LAPAROSCOPIC CHOLECYSTECTOMY W/ CHOLANGIOGRAPHY  11/09/04   SURGEON DR. Mecca CATH AND CORONARY ANGIOGRAPHY N/A 09/16/2018   Procedure: LEFT HEART CATH  AND CORONARY ANGIOGRAPHY;  Surgeon: Nigel Mormon, MD;  Location: [facility];  Service: Cardiovascular;  Laterality: N/A;   LEFT HEART CATHETERIZATION WITH CORONARY ANGIOGRAM N/A 10/29/2014   Procedure: LEFT HEART CATHETERIZATION WITH CORONARY ANGIOGRAM;  Surgeon: Laverda Page, MD;  Location[facility];  Service: Cardiovascular;  Laterality: N/A;   LIGATION OF COMPETING BRANCHES OF ARTERIOVENOUS FISTULA Left 07/21/2020   Procedure: LIGATION OF COMPETING BRANCHES OF LEFT UPPER ARM ARTERIOVENOUS FISTULA;  Surgeon: Waynetta Sandy, MD;  Location: [facility];  Service: Vascular;  Laterality: Left;   LOWER EXTREMITY ANGIOGRAM N/A 03/19/2012   Procedure: LOWER EXTREMITY ANGIOGRAM;  Surgeon: Burnell Blanks, MD;  Location: [facility];  Service: Cardiovascular;  Laterality: N/A;   LOWER EXTREMITY ANGIOGRAPHY N/A 06/20/2021   Procedure: LOWER EXTREMITY ANGIOGRAPHY;  Surgeon: Nigel Mormon, MD;  Location: [facility];  Service: Cardiovascular;  Laterality: N/A;   NECK SURGERY     PARTIAL COLECTOMY N/A 01/13/2018   Procedure: LAPAROSCOPIC ASSISTED   SIGMOID COLECTOMY ILEOSTOMY;  Surgeon: Clovis Riley, MD;  Location[facility];  Service: General;  Laterality: N/A;   PENILE PROSTHESIS IMPLANT  08/14/05   INFRAPUBIC INSERTION OF INFLATABLE PENILE PROSTHESIS; SURGEON DR. Amalia Hailey   PENILE PROSTHESIS IMPLANT     PERCUTANEOUS CORONARY STENT INTERVENTION (PCI-S) Right 10/29/2014   Procedure: PERCUTANEOUS CORONARY STENT INTERVENTION (PCI-S);  Surgeon: Laverda Page,  MD;  Location: [facility];  Service: Cardiovascular;  Laterality: Right;   PERIPHERAL VASCULAR INTERVENTION Left 06/23/2021   Procedure: PERIPHERAL VASCULAR INTERVENTION;  Surgeon: Nigel Mormon, MD;  Location: [facility];  Service: Cardiovascular;  Laterality: Left;   REMOVAL OF PENILE PROSTHESIS N/A 06/14/2021   Procedure: Removal of THREE piece inflatable penile prosthesis;  Surgeon: Lucas Mallow, MD;  Location: [facility];  Service: Urology;  Laterality: N/A;   SHOULDER ARTHROSCOPY     SPINE SURGERY     TOE AMPUTATION Left    TONSILLECTOMY     TOTAL KNEE ARTHROPLASTY  07/2002   RIGHT KNEE ; SURGEON  DR. [name] ALSO HAD ARTHROSCOPIC RIGHT KNEE IN  10/2001   TOTAL KNEE ARTHROPLASTY     ULNAR NERVE TRANSPOSITION Right 10/21/2013   Procedure: RIGHT ELBOW  ULNAR NERVE DECOMPRESSION;  Surgeon: Wynonia Sours, MD;  Location: [facility];  Service: Orthopedics;  Laterality: Right;   Social History   Occupational History   Occupation: Magazine features editor: UNEMPLOYED  Tobacco Use   Smoking status: Former    Packs/day: 2.00    Years: 35.00    Pack years: 70.00    Types: Cigarettes    Quit date: 10/28/2011    Years since quitting: 9.9   Smokeless tobacco: Never  Vaping Use   Vaping Use: Some days   Substances: Nicotine  Substance and Sexual Activity   Alcohol use: Not Currently    Comment: "not in a long time"   Drug use: Never   Sexual activity: Yes    Birth control/protection: Implant    Comment: penile implant

## 2021-10-20 ENCOUNTER — Other Ambulatory Visit: Payer: Self-pay

## 2021-10-20 ENCOUNTER — Encounter: Payer: Self-pay | Admitting: Family

## 2021-10-20 ENCOUNTER — Other Ambulatory Visit: Payer: Medicare Other | Admitting: Student

## 2021-10-20 ENCOUNTER — Ambulatory Visit (INDEPENDENT_AMBULATORY_CARE_PROVIDER_SITE_OTHER): Payer: 59 | Admitting: Family

## 2021-10-20 DIAGNOSIS — I87332 Chronic venous hypertension (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: Secondary | ICD-10-CM

## 2021-10-20 DIAGNOSIS — L97521 Non-pressure chronic ulcer of other part of left foot limited to breakdown of skin: Secondary | ICD-10-CM | POA: Diagnosis not present

## 2021-10-20 DIAGNOSIS — L97929 Non-pressure chronic ulcer of unspecified part of left lower leg with unspecified severity: Secondary | ICD-10-CM | POA: Diagnosis not present

## 2021-10-20 DIAGNOSIS — R52 Pain, unspecified: Secondary | ICD-10-CM

## 2021-10-20 DIAGNOSIS — Z515 Encounter for palliative care: Secondary | ICD-10-CM

## 2021-10-20 DIAGNOSIS — N186 End stage renal disease: Secondary | ICD-10-CM

## 2021-10-20 NOTE — Progress Notes (Signed)
Office Visit Note   Patient: [name]           Date of Birth: Jan 14, 1956           MRN: [id number] Visit Date: 10/20/2021              Requested by: [name], PA-C [address] [place],  [place] [address] PCP: [name], PA-C  Chief Complaint  Patient presents with   Left Foot - Follow-up      HPI: Patient is a 66 year old gentleman who presents in follow-up for ischemic ulcer lateral aspect of the left foot.  Patient is status post revascularization of the left anterior tibial artery with an endovascular procedure in October.  Assessment & Plan: Visit Diagnoses:  No diagnosis found.   Plan: Patient is showing good interval wound healing. we will continue with current wound care compression wraps.  Follow-Up Instructions: No follow-ups on file.   Ortho Exam  Patient is alert, oriented, no adenopathy, well-dressed, normal affect, normal respiratory effort. Examination patient the third toenail is absent this is without edema or erythema.  No drainage..  Examination the lateral aspect the left foot there is healthy granulation tissue in the wound bed.  The ulcers now measures 2 cm x 1cm and a satellite lesion which is 1 cm diameter.  No surrounding maceration no active drainage no erythema 3 layer compression wrap was applied with silver cell.  Imaging: No results found. No images are attached to the encounter.  Labs: Lab Results  Component Value Date   HGBA1C 7.9 (H) 06/14/2021   HGBA1C 5.7 (H) 09/16/2018   HGBA1C 7.7 (H) 05/15/2018   ESRSEDRATE 40 (H) 09/03/2021   ESRSEDRATE 17 (H) 06/24/2021   ESRSEDRATE 35 (H) 09/03/2016   CRP 2.7 (H) 09/07/2021   CRP 6.4 (H) 09/06/2021   CRP 8.4 (H) 09/05/2021   REPTSTATUS 09/07/2021 FINAL 09/02/2021   GRAMSTAIN  06/14/2021    NO ORGANISMS SEEN SQUAMOUS EPITHELIAL CELLS PRESENT ABUNDANT WBC PRESENT,BOTH PMN AND MONONUCLEAR MODERATE GRAM POSITIVE COCCI    CULT  09/02/2021    NO GROWTH 5  DAYS Performed at [facility], [place] [address], [place], [place] [address]    LABORGA ENTEROCOCCUS FAECALIS 06/14/2021     Lab Results  Component Value Date   ALBUMIN 3.0 (L) 09/07/2021   ALBUMIN 3.1 (L) 09/06/2021   ALBUMIN 2.7 (L) 09/05/2021   PREALBUMIN 19.0 05/04/2016   PREALBUMIN 25.0 12/16/2015    Lab Results  Component Value Date   MG 1.9 05/24/2018   MG 2.1 05/23/2018   MG 1.3 (L) 05/22/2018   No results found for: VD25OH  Lab Results  Component Value Date   PREALBUMIN 19.0 05/04/2016   PREALBUMIN 25.0 12/16/2015   CBC EXTENDED Latest Ref Rng & Units 09/07/2021 09/06/2021 09/05/2021  WBC 4.0 - 10.5 K/uL 4.8 2.3(L) 3.8(L)  RBC 4.22 - 5.81 MIL/uL 3.72(L) 3.96(L) 3.35(L)  HGB 13.0 - 17.0 g/dL 11.1(L) 11.9(L) 10.2(L)  HCT 39.0 - 52.0 % 33.0(L) 34.9(L) 30.1(L)  PLT 150 - 400 K/uL 151 117(L) 103(L)  NEUTROABS 1.7 - 7.7 K/uL 3.6 1.9 2.1  LYMPHSABS 0.7 - 4.0 K/uL 0.8 0.3(L) 1.1     There is no height or weight on file to calculate BMI.  Orders:  No orders of the defined types were placed in this encounter.  No orders of the defined types were placed in this encounter.    Procedures: No procedures performed  Clinical Data: No additional  findings.  ROS:  All other systems negative, except as noted in the HPI. Review of Systems  Objective: Vital Signs: There were no vitals taken for this visit.  Specialty Comments:  No specialty comments available.  PMFS History: Patient Active Problem List   Diagnosis Date Noted   Acute metabolic encephalopathy 18/29/9371   COVID 09/02/2021   Sepsis ([place]) 09/02/2021   Severe sepsis with lactic acidosis ([place]) 06/14/2021   Infection associated with implanted penile prosthesis ([place]) 06/14/2021   ESRD on hemodialysis ([place]) 06/14/2021   Paroxysmal atrial fibrillation with RVR ([place]) 06/14/2021   Carotid stenosis 06/15/2020   Dyslipidemia 06/15/2020   Severe nonproliferative diabetic retinopathy of right eye,  with macular edema, associated with type 2 diabetes mellitus ([place]) 01/18/2020   Severe nonproliferative diabetic retinopathy of left eye, with macular edema, associated with type 2 diabetes mellitus ([place]) 01/04/2020   Retinal hemorrhage of right eye 01/04/2020   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   Pseudoclaudication 11/15/2018   Essential hypertension 09/18/2018   S/P BKA (below knee amputation), right ([place]) 09/18/2018   Amputation of toe of left foot ([place]) 09/18/2018   Peripheral artery disease ([place]) 09/18/2018   S/P carotid endarterectomy 09/18/2018   OSA on CPAP 09/18/2018   Chronic back pain 09/18/2018   Status post reversal of ileostomy 05/21/2018   Normocytic anemia 02/14/2018   Intra-abdominal abscess ([place]) 11/04/2017   Chronic venous hypertension (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 ([place]) 12/11/2016   Ischemic ulcer of left foot, limited to breakdown of skin ([place]) 11/12/2016   Unilateral primary osteoarthritis, left knee 10/11/2016   History of right below knee amputation ([place]) 07/12/2016   Acute osteomyelitis of left foot ([place])    Diabetic foot infection ([place]) 05/04/2016   Insulin dependent type 2 diabetes mellitus ([place]) 12/16/2015   Amputated toe ([place]) 10/21/2015   Leg edema, left 09/03/2015   CAD (dz of distal, mid and proximal RCA with implantation of 3 overlapping drug-eluting stent,) 09/03/2015   Chronic diastolic heart failure, NYHA class 2 ([place]) 09/03/2015   Angina pectoris associated with type 2 diabetes mellitus ([place]) 10/28/2014   Hyperlipidemia 08/25/2014   Limb pain 03/20/2013   DJD (degenerative joint disease) 09/25/2012   Migraine 09/25/2012   Neuropathy 09/25/2012   Restless legs syndrome (RLS) 09/25/2012   Chronic obstructive pulmonary disease, unspecified ([place]) 04/25/2012   Unknown cause of morbidity or mortality 04/25/2012   Chronic total occlusion of artery of the extremities ([place]) 04/08/2012   Onychomycosis 02/01/2012    Occlusion and stenosis of carotid artery without mention of cerebral infarction 06/12/2011   GERD (gastroesophageal reflux disease) 05/08/2011   Barrett's esophagus without dysplasia 05/08/2011   Former tobacco use 02/01/2011   Past Medical History:  Diagnosis Date   Carotid artery occlusion 11/10/10   LEFT CAROTID ENDARTERECTOMY   Chronic kidney disease    Complication of anesthesia    BP WENT UP AT [facility] "   COPD (chronic obstructive pulmonary disease) ([place])    pt denies this dx as of 06/01/20 - no inhaler    Diabetes mellitus without complication ([place])    Diverticulitis    Diverticulosis of colon (without mention of hemorrhage)    DJD (degenerative joint disease)    knees/hands/feet/back/neck   Fatty liver    Full dentures    GERD (gastroesophageal reflux disease)    H/O hiatal hernia    History of blood transfusion    with a past surical procedure per patient 06/01/20   Hyperlipidemia  Hypertension    Neuromuscular disorder ([place])    peripheral neuropathy   Non-pressure chronic ulcer of other part of left foot limited to breakdown of skin ([place]) 11/12/2016   Osteomyelitis (H[facility])    left 5th metatarsal   PAD (peripheral artery disease) ([place])    Distal aortogram June 2012. Atherectomy left popliteal artery July 2012.    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 Before age 2   Hyperlipidemia Brother    Hypertension Brother    Cancer  Sister        Breast   Hyperlipidemia Sister    Hypertension Sister    Hypertension Son    Arthritis Other        GRANDMOTHER   Hypertension Other        OTHER FAMILY MEMBERS    Past Surgical History:  Procedure Laterality Date   ABDOMINAL AORTOGRAM W/LOWER EXTREMITY N/A 06/23/2021   Procedure: ABDOMINAL AORTOGRAM W/LOWER EXTREMITY;  Surgeon: Nigel Mormon, MD;  Location: [facility];  Service: Cardiovascular;  Laterality: N/A;   AMPUTATION  11/05/2011   Procedure: AMPUTATION RAY;  Surgeon: Wylene Simmer, MD;  Location: [facility];  Service: Orthopedics;  Laterality: Right;  Amputation of Right 4&5th Toes   AMPUTATION Left 11/26/2012   Procedure: AMPUTATION RAY;  Surgeon: Wylene Simmer, MD;  Location: [facility];  Service: Orthopedics;  Laterality: Left;  fourth ray amputation   AMPUTATION Right 08/27/2014   Procedure: Transmetatarsal Amputation;  Surgeon: Newt Minion, MD;  Location: [facility];  Service: Orthopedics;  Laterality: Right;   AMPUTATION Right 01/14/2015   Procedure: AMPUTATION BELOW KNEE;  Surgeon: Newt Minion, MD;  Location: [facility];  Service: Orthopedics;  Laterality: Right;   AMPUTATION Left 10/21/2015   Procedure: Left Foot 5th Ray Amputation;  Surgeon: Newt Minion, MD;  Location: [facility];  Service: Orthopedics;  Laterality: Left;   ANTERIOR FUSION CERVICAL SPINE  02/06/06   C4-5, C5-6, C6-7; SURGEON DR. MAX COHEN   AV FISTULA PLACEMENT Left 06/02/2020   Procedure: ARTERIOVENOUS (AV) FISTULA CREATION LEFT;  Surgeon: Waynetta Sandy, MD;  Location: [facility];  Service: Vascular;  Laterality: Left;   BACK SURGERY     x 3   [place] Left 07/21/2020   Procedure: LEFT UPPER ARM ATERIOVENOUS SUPERFISTULALIZATION;  Surgeon: Waynetta Sandy, MD;  Location: [facility];  Service: Vascular;  Laterality: Left;   BELOW KNEE LEG AMPUTATION Right    CARDIAC CATHETERIZATION  10/31/04   2009   CAROTID ENDARTERECTOMY  11/10/10   CAROTID ENDARTERECTOMY Left  11/10/2010   Subtotal occlusion of left internal carotid artery with left hemispheric transient  ischemic attacks.   CAROTID STENT     CARPAL TUNNEL RELEASE Right 10/21/2013   Procedure: RIGHT CARPAL TUNNEL RELEASE;  Surgeon: Wynonia Sours, MD;  Location: [facility];  Service: Orthopedics;  Laterality: Right;   CHOLECYSTECTOMY     COLON SURGERY     COLONOSCOPY     COLOSTOMY REVERSAL  05/21/2018   ileostomy reversal   CYSTOSCOPY WITH STENT PLACEMENT Bilateral 01/13/2018   Procedure: CYSTOSCOPY WITH BILATERAL URETERAL CATHETER PLACEMENT;  Surgeon: Ardis Hughs, MD;  [facility];  Service: Urology;  Laterality: Bilateral;   ESOPHAGEAL MANOMETRY Bilateral 07/19/2014   Procedure: ESOPHAGEAL MANOMETRY (EM);  Surgeon: Jerene Bears, MD;  [facility];  Service: Gastroenterology;  Laterality: Bilateral;   EYE SURGERY Bilateral 2020   cataract   FEMORAL ARTERY STENT     x6   FINGER SURGERY     FOOT SURGERY  04/25/2016    EXCISION BASE 5TH METATARSAL AND PARTIAL CUBOID LEFT FOOT   HERNIA REPAIR     LEFT INGUINAL AND UMBILICAL REPAIRS   HERNIA REPAIR     I & D EXTREMITY Left 04/25/2016   Procedure: EXCISION BASE 5TH METATARSAL AND PARTIAL CUBOID LEFT FOOT;  Surgeon: Newt Minion, MD;  Location: [facility];  Service: Orthopedics;  Laterality: Left;   ILEOSTOMY  01/13/2018   Procedure: ILEOSTOMY;  Surgeon: Clovis Riley, MD;  Location[facility];  Service: General;;   ILEOSTOMY CLOSURE N/A 05/21/2018   Procedure: ILEOSTOMY REVERSAL ERAS PATHWAY;  Surgeon: Clovis Riley, MD;  Location: [facility];  Service: General;  Laterality: N/A;   IR RADIOLOGIST EVAL & MGMT  11/19/2017   IR RADIOLOGIST EVAL & MGMT  12/03/2017   IR RADIOLOGIST EVAL & MGMT  12/18/2017   JOINT REPLACEMENT Right 2001   Total knee   LAMINECTOMY     X 3 LUMBAR AND X 2 CERVICAL SPINE OPERATIONS   LAPAROSCOPIC CHOLECYSTECTOMY W/ CHOLANGIOGRAPHY  11/09/04   SURGEON DR. [place] CATH AND CORONARY  ANGIOGRAPHY N/A 09/16/2018   Procedure: LEFT HEART CATH AND CORONARY ANGIOGRAPHY;  Surgeon: Nigel Mormon, MD;  Location: [facility];  Service: Cardiovascular;  Laterality: N/A;   LEFT HEART CATHETERIZATION WITH CORONARY ANGIOGRAM N/A 10/29/2014   Procedure: LEFT HEART CATHETERIZATION WITH CORONARY ANGIOGRAM;  Surgeon: Laverda Page, MD;  Location[facility];  Service: Cardiovascular;  Laterality: N/A;   LIGATION OF COMPETING BRANCHES OF ARTERIOVENOUS FISTULA Left 07/21/2020   Procedure: LIGATION OF COMPETING BRANCHES OF LEFT UPPER ARM ARTERIOVENOUS FISTULA;  Surgeon: Waynetta Sandy, MD;  Location: [facility];  Service: Vascular;  Laterality: Left;   LOWER EXTREMITY ANGIOGRAM N/A 03/19/2012   Procedure: LOWER EXTREMITY ANGIOGRAM;  Surgeon: Burnell Blanks, MD;  Location: [facility] LAB;  Service: Cardiovascular;  Laterality: N/A;   LOWER EXTREMITY ANGIOGRAPHY N/A 06/20/2021   Procedure: LOWER EXTREMITY ANGIOGRAPHY;  Surgeon: Nigel Mormon, MD;  Location: [facility];  Service: Cardiovascular;  Laterality: N/A;   NECK SURGERY     PARTIAL COLECTOMY N/A 01/13/2018   Procedure: LAPAROSCOPIC ASSISTED   SIGMOID COLECTOMY ILEOSTOMY;  Surgeon: Clovis Riley, MD;  [facility];  Service: General;  Laterality: N/A;   PENILE PROSTHESIS IMPLANT  08/14/05   INFRAPUBIC INSERTION OF INFLATABLE PENILE PROSTHESIS; SURGEON DR. Amalia Hailey   PENILE PROSTHESIS IMPLANT     PERCUTANEOUS CORONARY STENT INTERVENTION (PCI-S) Right 10/29/2014   Procedure: PERCUTANEOUS CORONARY STENT INTERVENTION (PCI-S);  Surgeon: Laverda Page, MD;  Location: [facility];  Service: Cardiovascular;  Laterality: Right;   PERIPHERAL VASCULAR INTERVENTION Left 06/23/2021   Procedure: PERIPHERAL VASCULAR INTERVENTION;  Surgeon: Nigel Mormon, MD;  Location: [facility];  Service: Cardiovascular;  Laterality: Left;   REMOVAL OF PENILE PROSTHESIS N/A 06/14/2021   Procedure: Removal of  THREE piece inflatable penile prosthesis;  Surgeon: Lucas Mallow, MD;  Location: [facility];  Service: Urology;  Laterality: N/A;   SHOULDER ARTHROSCOPY     SPINE SURGERY     TOE AMPUTATION Left    TONSILLECTOMY     TOTAL KNEE ARTHROPLASTY  07/2002   RIGHT KNEE ; SURGEON  DR. [name] ALSO HAD ARTHROSCOPIC RIGHT KNEE IN  10/2001   TOTAL KNEE ARTHROPLASTY     ULNAR NERVE TRANSPOSITION Right 10/21/2013   Procedure: RIGHT ELBOW  ULNAR NERVE DECOMPRESSION;  Surgeon: Wynonia Sours, MD;  Location: [facility];  Service: Orthopedics;  Laterality: Right;   Social History   Occupational History   Occupation: Magazine features editor: UNEMPLOYED  Tobacco Use   Smoking status: Former    Packs/day: 2.00    Years: 35.00    Pack years: 70.00    Types: Cigarettes    Quit date: 10/28/2011    Years since quitting: 9.9   Smokeless tobacco: Never  Vaping Use   Vaping Use: Some days   Substances: Nicotine  Substance and Sexual Activity   Alcohol use: Not Currently    Comment: "not in a long time"   Drug use: Never   Sexual activity: Yes    Birth control/protection: Implant    Comment: penile implant

## 2021-10-20 NOTE — Progress Notes (Signed)
[facility] Palliative Care Consult Note Telephone: [phone]  Fax: [phone]    Date of encounter: 10/20/21 1:57 PM PATIENT NAME: [name] [place] [id number]   [phone] (home)  DOB: April 24, 1956 MRN: [id number] PRIMARY CARE PROVIDER:    Fae Pippin,  [place] [address] [phone]  REFERRING PROVIDER:   [name], PA-C [place],  [place] [address] [phone]  RESPONSIBLE PARTY:    Contact Information     Name Relation Home Work Mobile   Hodgens,[name] [place] Spouse  [phone]   [place]ton, Dunaj   [phone]        I met face to face with patient and family in the home. Palliative Care was asked to follow this patient by consultation request of  [name], PA-C to address advance care planning and complex medical decision making. This is a follow up visit.                                   ASSESSMENT AND PLAN / RECOMMENDATIONS:   Advance Care Planning/Goals of Care: Goals include to maximize quality of life and symptom management. Patient/health care surrogate gave his/her permission to discuss.  CODE STATUS: Full Code  Symptom Management/Plan:  ESRD-continue HD on Tuesday/Thursday/Saturday as directed. Tolerating well. He is receiving Midodrine on his HD days.    Pain-chronic pain; pain managed with current regimen. Continue fentanyl 50 mcg every 72 hours, percocet TID PRN, Lyrica 100 mg BID directed. Palliative Medicine will refill Duragesic and percocet; scripts sent to [facility] on [address].   Chronic left foot vascular wound-acute osteomyelitis. Continue wound care compression wraps as directed. Patient current seeing [name] with orthopedic surgery twice weekly. Wound is improving and he will likely go back to weekly visits soon. Vancomycin was to be completed on 10/14/21.  Follow up Palliative Care Visit: Palliative  care will continue to follow for complex medical decision making, advance care planning, and clarification of goals. Return 4 weeks or prn.   This visit was coded based on medical decision making (MDM).  PPS: 40%  HOSPICE ELIGIBILITY/DIAGNOSIS: TBD  Chief Complaint: Palliative Medicine follow up visit.   HISTORY OF PRESENT ILLNESS:  [name] is a 66 y.o. year old male  with  ESRD on hemodialysis T-TH-Saturday, chronic diastolic CHF, paroxysmal atrial fibrillation, type 2 diabetes, hypertension, hyperlipidemia, CAD, hx of PCI and stent, severe PAD, right BKA with multiple toes amputated on the left foot, chronic left vascular wound, chronic pain, sinus bradycardia, anemia.   Reports phantom pain being worse  some days. Taking Lyrica 100 mg BID. Reports pain being a 4/10 at present. Expresses this is manageable. Endorses fair appetite. Lantus 30 units QHS, Humalog 13 units at meal time. Blood sugars run around 140-149 mg/dL. A 10-point review of systems is negative, except for the pertinent positives and negatives detailed in the HPI.   History obtained from review of EMR, discussion with primary team, and interview with family, facility staff/caregiver and/or [name].  I reviewed available labs, medications, imaging, studies and related documents from the EMR.  Records reviewed and summarized above.   Physical Exam: Pulse 85, resp 18, b/p 142/62, sats 96% on room air Constitutional: NAD General: frail appearing, WNWD EYES: anicteric sclera, lids intact, no discharge  ENMT: intact hearing, oral mucous membranes moist CV: S1S2, RRR Pulmonary: LCTA,  no increased work of breathing, no cough, room air Abdomen:normo-active BS + 4 quadrants, soft and non tender, no ascites GU: deferred MSK: moves extremities, right BKA Skin: warm and dry, no rashes or wounds on visible skin Neuro:  no generalized weakness,  no cognitive impairment Psych: non-anxious affect, A and O x  3 Hem/lymph/immuno: no widespread bruising   Thank you for the opportunity to participate in the care of [name].  The palliative care team will continue to follow. Please call our office at [phone] if we can be of additional assistance.   Ezekiel Slocumb, NP   COVID-19 PATIENT SCREENING TOOL Asked and negative response unless otherwise noted:   Have you had symptoms of covid, tested positive or been in contact with someone with symptoms/positive test in the past 5-10 days? No

## 2021-10-23 ENCOUNTER — Other Ambulatory Visit: Payer: Self-pay

## 2021-10-23 ENCOUNTER — Telehealth: Payer: Self-pay

## 2021-10-23 ENCOUNTER — Ambulatory Visit (INDEPENDENT_AMBULATORY_CARE_PROVIDER_SITE_OTHER): Payer: 59 | Admitting: Orthopedic Surgery

## 2021-10-23 DIAGNOSIS — L97521 Non-pressure chronic ulcer of other part of left foot limited to breakdown of skin: Secondary | ICD-10-CM | POA: Diagnosis not present

## 2021-10-23 DIAGNOSIS — I739 Peripheral vascular disease, unspecified: Secondary | ICD-10-CM | POA: Diagnosis not present

## 2021-10-23 DIAGNOSIS — I87332 Chronic venous hypertension (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: Secondary | ICD-10-CM | POA: Diagnosis not present

## 2021-10-23 DIAGNOSIS — L97929 Non-pressure chronic ulcer of unspecified part of left lower leg with unspecified severity: Secondary | ICD-10-CM | POA: Diagnosis not present

## 2021-10-25 ENCOUNTER — Ambulatory Visit: Payer: 59 | Admitting: Cardiology

## 2021-10-25 ENCOUNTER — Other Ambulatory Visit: Payer: Self-pay

## 2021-10-25 ENCOUNTER — Encounter: Payer: Self-pay | Admitting: Cardiology

## 2021-10-25 ENCOUNTER — Encounter: Payer: Self-pay | Admitting: Orthopedic Surgery

## 2021-10-25 VITALS — BP 177/77 | HR 88 | Temp 97.9°F | Resp 16 | Ht 74.0 in | Wt 234.0 lb

## 2021-10-25 DIAGNOSIS — I739 Peripheral vascular disease, unspecified: Secondary | ICD-10-CM

## 2021-10-25 DIAGNOSIS — I480 Paroxysmal atrial fibrillation: Secondary | ICD-10-CM

## 2021-10-25 DIAGNOSIS — Z992 Dependence on renal dialysis: Secondary | ICD-10-CM

## 2021-10-25 DIAGNOSIS — N186 End stage renal disease: Secondary | ICD-10-CM

## 2021-10-25 DIAGNOSIS — I2510 Atherosclerotic heart disease of native coronary artery without angina pectoris: Secondary | ICD-10-CM

## 2021-10-25 NOTE — Progress Notes (Signed)
Primary Physician/Referring:  [name], PA-C  Patient ID: [name], male    DOB: 13-Apr-1956, 66 y.o.   MRN: [id number]  Chief Complaint  Patient presents with   Atrial Fibrillation   Coronary Artery Disease   Follow-up   HPI:    [name]  is a 66 y.o. male with severe peripheral arterial disease and has history of right below-knee amputation and toe amputations on the left, multiple peripheral interventions in the past, last lower extremity revascularization in October 2022, his left foot ulceration has been healing well and is being followed at wound clinic and also with [name].  Also has uncontrolled diabetes mellitus, ESRD, severe diabetic retinopathy, coronary artery disease RCA PCI in 2016, right carotid endarterectomy in 2012 , OSA Unable to tolerate CPAP, chronic back pain and neck and back surgery in past and on chronic pain medications.    Patient presented to the emergency room on 09/02/2021 with community-acquired pneumonia, COVID-19 pneumonia, altered mental status and new onset A-fib with RVR, patient was started on amiodarone and eventually discharged home.  He now presents for follow-up.  Patient is without specific complaints today. He remains wheelchair bound and unfortunately continues to smoke e-cigarettes.  Past Medical History:  Diagnosis Date   Carotid artery occlusion 11/10/10   LEFT CAROTID ENDARTERECTOMY   Chronic kidney disease    Complication of anesthesia    BP WENT UP AT [facility] "   COPD (chronic obstructive pulmonary disease) ([place])    pt denies this dx as of 06/01/20 - no inhaler    Diabetes mellitus without complication ([place])    Diverticulitis    Diverticulosis of colon (without mention of hemorrhage)    DJD (degenerative joint disease)    knees/hands/feet/back/neck   Fatty liver    Full dentures    GERD (gastroesophageal reflux disease)    H/O hiatal hernia    History of blood transfusion    with a past surical procedure per patient  06/01/20   Hyperlipidemia    Hypertension    Neuromuscular disorder ([place])    peripheral neuropathy   Non-pressure chronic ulcer of other part of left foot limited to breakdown of skin ([place]) 11/12/2016   Osteomyelitis (HCC)    left 5th metatarsal   PAD (peripheral artery disease) ([place])    Distal aortogram June 2012. Atherectomy left popliteal artery July 2012.    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   Unstable angina ([place]) 09/16/2018   Wears glasses    Past Surgical History:  Procedure Laterality Date   ABDOMINAL AORTOGRAM W/LOWER EXTREMITY N/A 06/23/2021   Procedure: ABDOMINAL AORTOGRAM W/LOWER EXTREMITY;  Surgeon: Nigel Mormon, MD;  Location: [facility];  Service: Cardiovascular;  Laterality: N/A;   AMPUTATION  11/05/2011   Procedure: AMPUTATION RAY;  Surgeon: Wylene Simmer, MD;  Location: [facility];  Service: Orthopedics;  Laterality: Right;  Amputation of Right 4&5th Toes   AMPUTATION Left 11/26/2012   Procedure: AMPUTATION RAY;  Surgeon: Wylene Simmer, MD;  Location: [facility];  Service: Orthopedics;  Laterality: Left;  fourth ray amputation   AMPUTATION Right 08/27/2014   Procedure: Transmetatarsal Amputation;  Surgeon: Newt Minion, MD;  Location: [place];  Service: Orthopedics;  Laterality: Right;   AMPUTATION Right 01/14/2015   Procedure: AMPUTATION BELOW KNEE;  Surgeon: Beverely Low  Fernanda Drum, MD;  Location: [facility];  Service: Orthopedics;  Laterality: Right;   AMPUTATION Left 10/21/2015   Procedure: Left Foot 5th Ray Amputation;  Surgeon: Newt Minion, MD;  Location: [facility];  Service: Orthopedics;  Laterality: Left;   ANTERIOR FUSION CERVICAL SPINE  02/06/06   C4-5, C5-6, C6-7; SURGEON DR. MAX COHEN   AV FISTULA PLACEMENT Left 06/02/2020   Procedure: ARTERIOVENOUS (AV) FISTULA CREATION LEFT;  Surgeon: Waynetta Sandy, MD;  Location: [place] [facility];  Service: Vascular;   Laterality: Left;   BACK SURGERY     x 3   [place] Left 07/21/2020   Procedure: LEFT UPPER ARM ATERIOVENOUS SUPERFISTULALIZATION;  Surgeon: Waynetta Sandy, MD;  Location: [facility];  Service: Vascular;  Laterality: Left;   BELOW KNEE LEG AMPUTATION Right    CARDIAC CATHETERIZATION  10/31/04   2009   CAROTID ENDARTERECTOMY  11/10/10   CAROTID ENDARTERECTOMY Left 11/10/2010   Subtotal occlusion of left internal carotid artery with left hemispheric transient ischemic attacks.   CAROTID STENT     CARPAL TUNNEL RELEASE Right 10/21/2013   Procedure: RIGHT CARPAL TUNNEL RELEASE;  Surgeon: Wynonia Sours, MD;  Location: [place];  Service: Orthopedics;  Laterality: Right;   CHOLECYSTECTOMY     COLON SURGERY     COLONOSCOPY     COLOSTOMY REVERSAL  05/21/2018   ileostomy reversal   CYSTOSCOPY WITH STENT PLACEMENT Bilateral 01/13/2018   Procedure: CYSTOSCOPY WITH BILATERAL URETERAL CATHETER PLACEMENT;  Surgeon: Ardis Hughs, MD;  Location: [facility];  Service: Urology;  Laterality: Bilateral;   ESOPHAGEAL MANOMETRY Bilateral 07/19/2014   Procedure: ESOPHAGEAL MANOMETRY (EM);  Surgeon: Jerene Bears, MD;  [facility];  Service: Gastroenterology;  Laterality: Bilateral;   EYE SURGERY Bilateral 2020   cataract   FEMORAL ARTERY STENT     x6   FINGER SURGERY     FOOT SURGERY  04/25/2016    EXCISION BASE 5TH METATARSAL AND PARTIAL CUBOID LEFT FOOT   HERNIA REPAIR     LEFT INGUINAL AND UMBILICAL REPAIRS   HERNIA REPAIR     I & D EXTREMITY Left 04/25/2016   Procedure: EXCISION BASE 5TH METATARSAL AND PARTIAL CUBOID LEFT FOOT;  Surgeon: Newt Minion, MD;  Location: [facility];  Service: Orthopedics;  Laterality: Left;   ILEOSTOMY  01/13/2018   Procedure: ILEOSTOMY;  Surgeon: Clovis Riley, MD;  Location: [facility];  Service: General;;   ILEOSTOMY CLOSURE N/A 05/21/2018   Procedure: ILEOSTOMY REVERSAL ERAS PATHWAY;  Surgeon: Clovis Riley, MD;  Location:  [facility];  Service: General;  Laterality: N/A;   IR RADIOLOGIST EVAL & MGMT  11/19/2017   IR RADIOLOGIST EVAL & MGMT  12/03/2017   IR RADIOLOGIST EVAL & MGMT  12/18/2017   JOINT REPLACEMENT Right 2001   Total knee   LAMINECTOMY     X 3 LUMBAR AND X 2 CERVICAL SPINE OPERATIONS   LAPAROSCOPIC CHOLECYSTECTOMY W/ CHOLANGIOGRAPHY  11/09/04   SURGEON DR. [place] CATH AND CORONARY ANGIOGRAPHY N/A 09/16/2018   Procedure: LEFT HEART CATH AND CORONARY ANGIOGRAPHY;  Surgeon: Nigel Mormon, MD;  Location: [place] [facility];  Service: Cardiovascular;  Laterality: N/A;   LEFT HEART CATHETERIZATION WITH CORONARY ANGIOGRAM N/A 10/29/2014   Procedure: LEFT HEART CATHETERIZATION WITH CORONARY ANGIOGRAM;  Surgeon: Laverda Page, MD;  Location: [facility];  Service: Cardiovascular;  Laterality: N/A;   LIGATION OF COMPETING BRANCHES OF ARTERIOVENOUS  FISTULA Left 07/21/2020   Procedure: LIGATION OF COMPETING BRANCHES OF LEFT UPPER ARM ARTERIOVENOUS FISTULA;  Surgeon: Waynetta Sandy, MD;  Location: [facility];  Service: Vascular;  Laterality: Left;   LOWER EXTREMITY ANGIOGRAM N/A 03/19/2012   Procedure: LOWER EXTREMITY ANGIOGRAM;  Surgeon: Burnell Blanks, MD;  Location: [facility];  Service: Cardiovascular;  Laterality: N/A;   LOWER EXTREMITY ANGIOGRAPHY N/A 06/20/2021   Procedure: LOWER EXTREMITY ANGIOGRAPHY;  Surgeon: Nigel Mormon, MD;  Location: [place] [facility];  Service: Cardiovascular;  Laterality: N/A;   NECK SURGERY     PARTIAL COLECTOMY N/A 01/13/2018   Procedure: LAPAROSCOPIC ASSISTED   SIGMOID COLECTOMY ILEOSTOMY;  Surgeon: Clovis Riley, MD;  Location: [facility];  Service: General;  Laterality: N/A;   PENILE PROSTHESIS IMPLANT  08/14/05   INFRAPUBIC INSERTION OF INFLATABLE PENILE PROSTHESIS; SURGEON DR. Amalia Hailey   PENILE PROSTHESIS IMPLANT     PERCUTANEOUS CORONARY STENT INTERVENTION (PCI-S) Right 10/29/2014   Procedure: PERCUTANEOUS CORONARY STENT INTERVENTION  (PCI-S);  Surgeon: Laverda Page, MD;  Location: [facility];  Service: Cardiovascular;  Laterality: Right;   PERIPHERAL VASCULAR INTERVENTION Left 06/23/2021   Procedure: PERIPHERAL VASCULAR INTERVENTION;  Surgeon: Nigel Mormon, MD;  Location: [facility];  Service: Cardiovascular;  Laterality: Left;   REMOVAL OF PENILE PROSTHESIS N/A 06/14/2021   Procedure: Removal of THREE piece inflatable penile prosthesis;  Surgeon: Lucas Mallow, MD;  Location: [place] [facility];  Service: Urology;  Laterality: N/A;   SHOULDER ARTHROSCOPY     SPINE SURGERY     TOE AMPUTATION Left    TONSILLECTOMY     TOTAL KNEE ARTHROPLASTY  07/2002   RIGHT KNEE ; SURGEON  DR. [name] ALSO HAD ARTHROSCOPIC RIGHT KNEE IN  10/2001   TOTAL KNEE ARTHROPLASTY     ULNAR NERVE TRANSPOSITION Right 10/21/2013   Procedure: RIGHT ELBOW  ULNAR NERVE DECOMPRESSION;  Surgeon: Wynonia Sours, MD;  Location: [facility];  Service: Orthopedics;  Laterality: Right;   Family History  Problem Relation Age of Onset   Heart disease Father        Before age 29-  CAD, BPG   Diabetes Father        Amputation   Cancer Father        PROSTATE   Hyperlipidemia Father    Hypertension Father    Heart attack Father        Triple BPG   Varicose Veins Father    Colon cancer Brother    Diabetes Brother    Heart disease Brother 54       A-Fib. Before age 63   Hyperlipidemia Brother    Hypertension Brother    Cancer Sister        Breast   Hyperlipidemia Sister    Hypertension Sister    Hypertension Son    Arthritis Other        GRANDMOTHER   Hypertension Other        OTHER FAMILY MEMBERS   Social History   Tobacco Use   Smoking status: Former    Packs/day: 2.00    Years: 35.00    Pack years: 70.00    Types: Cigarettes    Quit date: 10/28/2011    Years since quitting: 10.0   Smokeless tobacco: Never  Substance Use Topics   Alcohol use: Not Currently    Comment: "not in a long time"   Marital Status:  Married   ROS  Review of  Systems  Cardiovascular:  Positive for leg swelling. Negative for chest pain, dyspnea on exertion and palpitations.  Objective   Vitals with BMI 10/25/2021 09/07/2021 09/07/2021  Height 6\' 2"  - -  Weight - - 244 lbs 11 oz  BMI - - 56.31  Systolic - 497 026  Diastolic - 55 50  Pulse - 65 -    Resp. rate 16, height 6\' 2"  (1.88 m). Body mass index is 31.42 kg/m.   Physical Exam Vitals reviewed.  Constitutional:      General: He is not in acute distress.    Appearance: He is obese.     Comments: Well built and moderately obese in no acute distress  Neck:     Vascular: Carotid bruit (bilateral) present. No JVD.  Cardiovascular:     Rate and Rhythm: Normal rate and regular rhythm.     Pulses: Intact distal pulses.          Femoral pulses are 1+ on the right side with bruit and 1+ on the left side with bruit.      Popliteal pulses are 0 on the right side and 0 on the left side.       Dorsalis pedis pulses are 0 on the left side. Right dorsalis pedis pulse not accessible.       Posterior tibial pulses are 0 on the left side. Right posterior tibial pulse not accessible.     Heart sounds: Normal heart sounds. No murmur heard.   No gallop.     Comments: Right BKA. Left brachial artery AV Shunt noted Pulmonary:     Effort: Pulmonary effort is normal.     Breath sounds: Normal breath sounds.  Musculoskeletal:     Cervical back: Neck supple.     Left lower leg: Edema (difficult to evaluate due to compression wrapping in place) present.     Comments: Left foot ulcers and swelling, as well as pulses difficult to evaluate as patient has wound dressing and compression wrap in place from Dr. Jess Barters office earlier this morning.   Laboratory examination:   Recent Labs    09/05/21 0151 09/06/21 0126 09/07/21 0238  NA 135 133* 133*  K 3.4* 4.0 3.8  CL 100 96* 98  CO2 21* 23 22  GLUCOSE 129* 417* 335*  BUN 43* 30* 44*  CREATININE 5.74* 4.85* 5.82*  CALCIUM  7.9* 8.4* 8.6*  GFRNONAA 10* 12* 10*   CMP Latest Ref Rng & Units 09/07/2021 09/06/2021 09/05/2021  Glucose 70 - 99 mg/dL 335(H) 417(H) 129(H)  BUN 8 - 23 mg/dL 44(H) 30(H) 43(H)  Creatinine 0.61 - 1.24 mg/dL 5.82(H) 4.85(H) 5.74(H)  Sodium 135 - 145 mmol/L 133(L) 133(L) 135  Potassium 3.5 - 5.1 mmol/L 3.8 4.0 3.4(L)  Chloride 98 - 111 mmol/L 98 96(L) 100  CO2 22 - 32 mmol/L 22 23 21(L)  Calcium 8.9 - 10.3 mg/dL 8.6(L) 8.4(L) 7.9(L)  Total Protein 6.5 - 8.1 g/dL 5.9(L) 5.9(L) 5.1(L)  Total Bilirubin 0.3 - 1.2 mg/dL 0.8 0.6 0.8  Alkaline Phos 38 - 126 U/L 96 98 89  AST 15 - 41 U/L 16 18 17   ALT 0 - 44 U/L 14 13 9    CBC Latest Ref Rng & Units 09/07/2021 09/06/2021 09/05/2021  WBC 4.0 - 10.5 K/uL 4.8 2.3(L) 3.8(L)  Hemoglobin 13.0 - 17.0 g/dL 11.1(L) 11.9(L) 10.2(L)  Hematocrit 39.0 - 52.0 % 33.0(L) 34.9(L) 30.1(L)  Platelets 150 - 400 K/uL 151 117(L) 103(L)   Lipid Panel  Component Value Date/Time   CHOL 117 05/30/2020 1646   TRIG 133 05/30/2020 1646   HDL 24 (L) 05/30/2020 1646   LDLCALC 69 05/30/2020 1646   HEMOGLOBIN A1C Lab Results  Component Value Date   HGBA1C 7.9 (H) 06/14/2021   MPG 180.03 06/14/2021   External labs:  A1C 6.100 % 03/30/2020   Hemoglobin 10.800 05/23/2020 Platelets 203.000 x1 05/02/2020  Creatinine, Serum 4.140 mg/ 05/02/2020 Potassium 4.300 mm 05/02/2020 ALT (SGPT) 16.000 IU/ 05/02/2020  Cholesterol, total 142.000 m 08/19/2018 HDL 32.000 mg 08/19/2018 LDL 79.000 mg 08/19/2018 Triglycerides 156.000 m 08/19/2018  A1C 6.900 % 09/02/2019  Creatinine, Serum 3.590 mg/ 10/05/2019 Potassium 4.300 mm 10/05/2019 ALT (SGPT) 12.000 IU/ 10/05/2019  Hemoglobin 9.700 11/09/2019; INR 1.000 01/07/2018 Platelets 232.000 x1 10/05/2019  Allergies   Allergies  Allergen Reactions   Contrast Media [Iodinated Contrast Media] Shortness Of Breath and Other (See Comments)    Difficulty breathing and altered mental status     Ivp Dye [Iodinated Contrast Media]  Anaphylaxis, Shortness Of Breath and Other (See Comments)    Breathing problems, altered mental state    Adhesive [Tape] Rash and Other (See Comments)    Rash after 1 day of use   Latex Rash and Other (See Comments)    A severe rash appears after the first 24 hours of being placed    Medications Prior to Visit:   Outpatient Medications Prior to Visit  Medication Sig Dispense Refill   amiodarone (PACERONE) 200 MG tablet Take 1 tablet (200 mg total) by mouth daily. Two (2) tablets for 10 days then one tab daily 100 tablet 1   amLODipine (NORVASC) 5 MG tablet Take 5 mg by mouth daily.     apixaban (ELIQUIS) 5 MG TABS tablet Take 1 tablet (5 mg total) by mouth 2 (two) times daily. 60 tablet 0   atorvastatin (LIPITOR) 40 MG tablet Take 40 mg by mouth at bedtime.      famotidine (PEPCID) 20 MG tablet Take 1 tablet (20 mg total) by mouth daily. 30 tablet 0   fentaNYL (DURAGESIC) 50 MCG/HR Place 1 patch onto the skin every 3 (three) days. 2 patch 0   folic acid-vitamin b complex-vitamin c-selenium-zinc (DIALYVITE) 3 MG TABS tablet Take 1 tablet by mouth daily.     Glucosamine HCl (GLUCOSAMINE PO) Take 1 tablet by mouth 2 (two) times daily.     HUMALOG KWIKPEN 100 UNIT/ML KwikPen Inject 4 Units into the skin 3 (three) times daily. 15 mL 0   insulin glargine (LANTUS) 100 UNIT/ML injection Inject 0.1 mLs (10 Units total) into the skin at bedtime. 10 mL 0   ketoconazole (NIZORAL) 2 % cream Apply 1 application topically 2 (two) times daily as needed for irritation.   1   lidocaine-prilocaine (EMLA) cream Apply 1 application topically See admin instructions. Prior to Dialysis days Tuesday,Thursday and saturday     linaclotide (LINZESS) 145 MCG CAPS capsule Take 145 mcg by mouth daily as needed (constipation).      loratadine (CLARITIN) 10 MG tablet Take 10 mg by mouth daily.     midodrine (PROAMATINE) 10 MG tablet Take 1 tablet (10 mg total) by mouth every other day. One hour before dialysis 30 tablet 3    NEEDLE, REUSABLE, 22 G 22G X 1-1/2" MISC 1 Units by Does not apply route as directed. For B12 IM inj 10 each 0   nitroGLYCERIN (NITRODUR - DOSED IN MG/24 HR) 0.2 mg/hr patch Place 1 patch (0.2 mg total) onto the  skin daily. 30 patch 12   omega-3 acid ethyl esters (LOVAZA) 1 g capsule Take 2 g by mouth 2 (two) times daily.     ondansetron (ZOFRAN) 4 MG tablet Take 4 mg by mouth every 6 (six) hours as needed for nausea or vomiting.     oxyCODONE-acetaminophen (PERCOCET) 7.5-325 MG tablet Take 1 tablet by mouth 3 (three) times daily as needed for moderate pain. 10 tablet 0   pantoprazole (PROTONIX) 40 MG tablet Take 40 mg by mouth daily.     Polyvinyl Alcohol-Povidone PF 1.4-0.6 % SOLN Place 1 drop into both eyes 2 (two) times daily as needed (dry eyes).      pregabalin (LYRICA) 75 MG capsule Take 1 capsule (75 mg total) by mouth daily for 14 days. (Patient taking differently: Take by mouth at bedtime.) 14 capsule 0   rOPINIRole (REQUIP) 2 MG tablet Take 2 mg by mouth at bedtime.     Syringe/Needle, Disp, (SYRINGE 3CC/22GX1-1/2") 22G X 1-1/2" 3 ML MISC 1 Syringe by Does not apply route as directed. For b12 IM inj 10 each 0   tamsulosin (FLOMAX) 0.4 MG CAPS capsule Take 0.4 mg by mouth daily.     No facility-administered medications prior to visit.   Final Medications at End of Visit    No outpatient medications have been marked as taking for the 10/25/21 encounter (Office Visit) with Adrian Prows, MD.    Cardiac Studies:   Left popliteal artery atherectomy in June 2012 and left below knee PTA 2014 by Dr. Brunetta Jeans at [place].   Lexiscan sestamibi stress test 03/18/2017: 1. The resting electrocardiogram demonstrated normal sinus rhythm, RBBB and no resting arrhythmias. Stress EKG is non-diagnostic for ischemia as it a pharmacologic stress using Lexiscan. Occasional PVC noted. Stress symptoms included dyspnea, dizziness and chest pain. 2. The LV is dilated both at rest and stress images. The LV end  diastolic volume was 852DP. Perfusion images reveal inferior soft tissue attenuation artifact. There is no ischemia or scar. Overall left ventricular systolic function was normal without regional wall motion abnormalities. The left ventricular ejection fraction was calculated to be 51%. This is a low risk study. Compared to the study done on 10/15/2014, inferior wall ischemia and associated inferior hypokinesis is no longer present.  Coronary angiogram 09/17/18: Widely patent stent stents. Normal LVEF. Coronary Angiogram 10/29/2014: CAD s/p stenting of the distal, mid and proximal RCA with implantation of 3 overlapping drug-eluting stent, from distal to proximal 2.5 x 38 mm, 2.75 x 38 mm and a 2.75 x 20 mm Promus premier DES.   Carotid artery duplex  06/09/2019:  Minimal stenosis in bilateral ICA of 1-15%. Heterogeneous plaque. Stenosis  in the right external carotid artery (<50%).  Left carotid endarterectomy site is patent.  Right vertebral artery flow is not well visualized. Antegrade left  vertebral artery flow.  No significant change from 03/01/2017. Follow up studies when clinically  Indicated.  Echocardiogram 06/17/2021: 1. Left ventricular ejection fraction, by estimation, is 55 to 60%. The left ventricle has normal function. The left ventricle has no regional wall motion abnormalities. There is mild concentric left ventricular hypertrophy. Left ventricular diastolic parameters are indeterminate.   2. Right ventricular systolic function is normal. The right ventricular size is normal.   3. Left atrial size was moderately dilated.   4. The mitral valve is normal in structure. No evidence of mitral valve regurgitation. Moderate mitral annular calcification.   5. The aortic valve is tricuspid. Aortic valve regurgitation is  not visualized. Mild to moderate aortic valve sclerosis/calcification is present, without any evidence of aortic stenosis.  Lower extremity angiography 06/20/2021: No  significant Ileofemoral disease Patent Lt SFA stents 2/3 vessel below the knee runoff to the foot Proximally occluded Lt AT  Lower extremity arterial duplex 06/22/2021: Right: Multiphasic waveforms noted in the CFA, FA, PFA, and PTA. Monophasic flow noted in the popliteal artery indicative of a more proximal stenosis. Monophasic flow in the anterior tibial artery.  Abdominal aortogram with lower extremity and peripheral vascular intervention 06/23/2021: Proximally occluded left anterior tibial artery Successful revascularization with PTA 3.0 x 220, 2.0 x 220, 1.5 x 40 mm over-the-wire balloons 0% residual stenosis, but sluggish flow into pedal plantar arch Recommend aspirin and Plavix at least for 1 month.  EKG:   EKG 10/25/2021: Normal sinus rhythm at rate of 91 bpm, left axis deviation, left anterior fascicular block.  Right bundle branch block.  LVH.   06/12/2021: Atypical atrial flutter with RVR at the rate of 106 bpm, left axis deviation, left anterior fascicular block.  Right bundle branch block.  Nonspecific T abnormality.  12/12/2020: Sinus bradycardia at rate of 58 bpm with first-degree AV block, left axis deviation, left anterior fascicular block.  Right bundle branch block.  Borderline criteria for LVH by voltage.  No change from 05/27/2020.  Assessment     ICD-10-CM   1. Paroxysmal atrial fibrillation with RVR (HCC)  I48.0 EKG 12-Lead    2. Peripheral artery disease (HCC)  I73.9     3. Coronary artery disease involving native coronary artery of native heart without angina pectoris  I25.10     4. Paroxysmal atrial fibrillation (HCC)  I48.0       No orders of the defined types were placed in this encounter.   There are no discontinued medications.   Recommendations:   [name]  is a 66 y.o. Caucasian male patient with severe peripheral arterial disease and has history of right below-knee amputation and toe amputations on the left, multiple peripheral  interventions in the past, last lower extremity revascularization in October 2022, his left foot ulceration has been healing well and is being followed at wound clinic and also with [name].  Also has uncontrolled diabetes mellitus, ESRD, severe diabetic retinopathy, coronary artery disease RCA PCI in 2016, right carotid endarterectomy in 2012 , OSA Unable to tolerate CPAP, chronic back pain and neck and back surgery in past and on chronic pain medications.    Patient presented to the emergency room on 09/02/2021 with community-acquired pneumonia, COVID-19 pneumonia, altered mental status and new onset A-fib with RVR, patient was started on amiodarone, converted to sinus rhythm.  He is tolerating Eliquis without bleeding diathesis.  With regard to peripheral arterial disease, I reviewed the notes from [name], his left leg ulceration is limited to skin breakdown only and is healing.  He is presently doing well, remains asymptomatic.  His blood pressure is markedly elevated however he is also on midodrine due to low blood pressure during dialysis since I do not make any changes to his medications.  No clinical evidence of heart failure, he has not had any recurrence of angina pectoris.  No changes in the medications were done by me today, I will see him back in 6 months or sooner if problems.     Adrian Prows, MD, [facility] 10/25/2021, 2:09 PM Office: [phone] Fax: [phone] Pager: [phone]

## 2021-10-25 NOTE — Progress Notes (Signed)
Office Visit Note   Patient: [name]           Date of Birth: April 28, 1956           MRN: [id number] Visit Date: 10/23/2021              Requested by: [name], PA-C [address] [place],  [place] [id number] PCP: [name], PA-C  Chief Complaint  Patient presents with   Left Foot - Wound Check      HPI: Patient is a 66 year old gentleman who presents in follow-up for ischemic ulcers lateral left foot with venous insufficiency undergoing compression wraps status post endovascular intervention.  Assessment & Plan: Visit Diagnoses:  1. Ischemic ulcer of left foot, limited to breakdown of skin ([place])   2. Chronic venous hypertension (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 (HCC)   3. PVD (peripheral vascular disease) ([place])     Plan: Plan: We will continue with silver cell over the lateral foot wounds with a Dynaflex wrap.  Follow-Up Instructions: Return in about 1 week (around 10/30/2021).   Ortho Exam  Patient is alert, oriented, no adenopathy, well-dressed, normal affect, normal respiratory effort. Examination of the lateral foot ulcers continue to improve with excellent granulation tissue there is no cellulitis no exposed bone or tendon.  The ulcer is 2 cm in diameter.  Imaging: No results found. No images are attached to the encounter.  Labs: Lab Results  Component Value Date   HGBA1C 7.9 (H) 06/14/2021   HGBA1C 5.7 (H) 09/16/2018   HGBA1C 7.7 (H) 05/15/2018   ESRSEDRATE 40 (H) 09/03/2021   ESRSEDRATE 17 (H) 06/24/2021   ESRSEDRATE 35 (H) 09/03/2016   CRP 2.7 (H) 09/07/2021   CRP 6.4 (H) 09/06/2021   CRP 8.4 (H) 09/05/2021   REPTSTATUS 09/07/2021 FINAL 09/02/2021   GRAMSTAIN  06/14/2021    NO ORGANISMS SEEN SQUAMOUS EPITHELIAL CELLS PRESENT ABUNDANT WBC PRESENT,BOTH PMN AND MONONUCLEAR MODERATE GRAM POSITIVE COCCI    CULT  09/02/2021    NO GROWTH 5 DAYS Performed at [facility], [place] [address], [place],  [place] [address]    LABORGA ENTEROCOCCUS FAECALIS 06/14/2021     Lab Results  Component Value Date   ALBUMIN 3.0 (L) 09/07/2021   ALBUMIN 3.1 (L) 09/06/2021   ALBUMIN 2.7 (L) 09/05/2021   PREALBUMIN 19.0 05/04/2016   PREALBUMIN 25.0 12/16/2015    Lab Results  Component Value Date   MG 1.9 05/24/2018   MG 2.1 05/23/2018   MG 1.3 (L) 05/22/2018   No results found for: VD25OH  Lab Results  Component Value Date   PREALBUMIN 19.0 05/04/2016   PREALBUMIN 25.0 12/16/2015   CBC EXTENDED Latest Ref Rng & Units 09/07/2021 09/06/2021 09/05/2021  WBC 4.0 - 10.5 K/uL 4.8 2.3(L) 3.8(L)  RBC 4.22 - 5.81 MIL/uL 3.72(L) 3.96(L) 3.35(L)  HGB 13.0 - 17.0 g/dL 11.1(L) 11.9(L) 10.2(L)  HCT 39.0 - 52.0 % 33.0(L) 34.9(L) 30.1(L)  PLT 150 - 400 K/uL 151 117(L) 103(L)  NEUTROABS 1.7 - 7.7 K/uL 3.6 1.9 2.1  LYMPHSABS 0.7 - 4.0 K/uL 0.8 0.3(L) 1.1     There is no height or weight on file to calculate BMI.  Orders:  No orders of the defined types were placed in this encounter.  No orders of the defined types were placed in this encounter.    Procedures: No procedures performed  Clinical Data: No additional findings.  ROS:  All other systems negative, except as noted  in the HPI. Review of Systems  Objective: Vital Signs: There were no vitals taken for this visit.  Specialty Comments:  No specialty comments available.  PMFS History: Patient Active Problem List   Diagnosis Date Noted   Acute metabolic encephalopathy 97/58/8325   COVID 09/02/2021   Sepsis ([place]) 09/02/2021   Severe sepsis with lactic acidosis ([place]) 06/14/2021   Infection associated with implanted penile prosthesis ([place]) 06/14/2021   ESRD on hemodialysis ([place]) 06/14/2021   Paroxysmal atrial fibrillation with RVR ([place]) 06/14/2021   Carotid stenosis 06/15/2020   Dyslipidemia 06/15/2020   Severe nonproliferative diabetic retinopathy of right eye, with macular edema, associated with type 2 diabetes mellitus ([place])  01/18/2020   Severe nonproliferative diabetic retinopathy of left eye, with macular edema, associated with type 2 diabetes mellitus ([place]) 01/04/2020   Retinal hemorrhage of right eye 01/04/2020   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   Pseudoclaudication 11/15/2018   Essential hypertension 09/18/2018   S/P BKA (below knee amputation), right ([place]) 09/18/2018   Amputation of toe of left foot ([place]) 09/18/2018   Peripheral artery disease ([place]) 09/18/2018   S/P carotid endarterectomy 09/18/2018   OSA on CPAP 09/18/2018   Chronic back pain 09/18/2018   Status post reversal of ileostomy 05/21/2018   Normocytic anemia 02/14/2018   Intra-abdominal abscess ([place]) 11/04/2017   Chronic venous hypertension (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 ([place]) 12/11/2016   Ischemic ulcer of left foot, limited to breakdown of skin ([place]) 11/12/2016   Unilateral primary osteoarthritis, left knee 10/11/2016   History of right below knee amputation ([place]) 07/12/2016   Acute osteomyelitis of left foot ([place])    Diabetic foot infection ([place]) 05/04/2016   Insulin dependent type 2 diabetes mellitus ([place]) 12/16/2015   Amputated toe ([place]) 10/21/2015   Leg edema, left 09/03/2015   CAD (dz of distal, mid and proximal RCA with implantation of 3 overlapping drug-eluting stent,) 09/03/2015   Chronic diastolic heart failure, NYHA class 2 ([place]) 09/03/2015   Angina pectoris associated with type 2 diabetes mellitus ([place]) 10/28/2014   Hyperlipidemia 08/25/2014   Limb pain 03/20/2013   DJD (degenerative joint disease) 09/25/2012   Migraine 09/25/2012   Neuropathy 09/25/2012   Restless legs syndrome (RLS) 09/25/2012   Chronic obstructive pulmonary disease, unspecified ([place]) 04/25/2012   Unknown cause of morbidity or mortality 04/25/2012   Chronic total occlusion of artery of the extremities ([place]) 04/08/2012   Onychomycosis 02/01/2012   Occlusion and stenosis of carotid artery without mention of cerebral  infarction 06/12/2011   GERD (gastroesophageal reflux disease) 05/08/2011   Barrett's esophagus without dysplasia 05/08/2011   Former tobacco use 02/01/2011   Past Medical History:  Diagnosis Date   Carotid artery occlusion 11/10/10   LEFT CAROTID ENDARTERECTOMY   Chronic kidney disease    Complication of anesthesia    BP WENT UP AT [facility] "   COPD (chronic obstructive pulmonary disease) ([place])    pt denies this dx as of 06/01/20 - no inhaler    Diabetes mellitus without complication ([place])    Diverticulitis    Diverticulosis of colon (without mention of hemorrhage)    DJD (degenerative joint disease)    knees/hands/feet/back/neck   Fatty liver    Full dentures    GERD (gastroesophageal reflux disease)    H/O hiatal hernia    History of blood transfusion    with a past surical procedure per patient 06/01/20   Hyperlipidemia    Hypertension    Neuromuscular disorder ([place])  peripheral neuropathy   Non-pressure chronic ulcer of other part of left foot limited to breakdown of skin ([place]) 11/12/2016   Osteomyelitis (HCC)    left 5th metatarsal   PAD (peripheral artery disease) ([place])    Distal aortogram June 2012. Atherectomy left popliteal artery July 2012.    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 Before age 54   Hyperlipidemia Brother     Hypertension Brother    Hypertension Son    Arthritis Other        GRANDMOTHER   Hypertension Other        OTHER FAMILY MEMBERS    Past Surgical History:  Procedure Laterality Date   ABDOMINAL AORTOGRAM W/LOWER EXTREMITY N/A 06/23/2021   Procedure: ABDOMINAL AORTOGRAM W/LOWER EXTREMITY;  Surgeon: Nigel Mormon, MD;  Location: [facility];  Service: Cardiovascular;  Laterality: N/A;   AMPUTATION  11/05/2011   Procedure: AMPUTATION RAY;  Surgeon: Wylene Simmer, MD;  Location: [facility];  Service: Orthopedics;  Laterality: Right;  Amputation of Right 4&5th Toes   AMPUTATION Left 11/26/2012   Procedure: AMPUTATION RAY;  Surgeon: Wylene Simmer, MD;  Location: [facility];  Service: Orthopedics;  Laterality: Left;  fourth ray amputation   AMPUTATION Right 08/27/2014   Procedure: Transmetatarsal Amputation;  Surgeon: Newt Minion, MD;  Location: [facility];  Service: Orthopedics;  Laterality: Right;   AMPUTATION Right 01/14/2015   Procedure: AMPUTATION BELOW KNEE;  Surgeon: Newt Minion, MD;  Location: [facility];  Service: Orthopedics;  Laterality: Right;   AMPUTATION Left 10/21/2015   Procedure: Left Foot 5th Ray Amputation;  Surgeon: Newt Minion, MD;  Location: [facility];  Service: Orthopedics;  Laterality: Left;   ANTERIOR FUSION CERVICAL SPINE  02/06/06   C4-5, C5-6, C6-7; SURGEON DR. MAX COHEN   AV FISTULA PLACEMENT Left 06/02/2020   Procedure: ARTERIOVENOUS (AV) FISTULA CREATION LEFT;  Surgeon: Waynetta Sandy, MD;  Location: [facility];  Service: Vascular;  Laterality: Left;   BACK SURGERY     x 3   [place] Left 07/21/2020   Procedure: LEFT UPPER ARM ATERIOVENOUS SUPERFISTULALIZATION;  Surgeon: Waynetta Sandy, MD;  Location: [facility];  Service: Vascular;  Laterality: Left;   BELOW KNEE LEG AMPUTATION Right    CARDIAC CATHETERIZATION  10/31/04   2009   CAROTID ENDARTERECTOMY  11/10/10   CAROTID ENDARTERECTOMY Left 11/10/2010   Subtotal occlusion of left internal  carotid artery with left hemispheric transient ischemic attacks.  CAROTID STENT     CARPAL TUNNEL RELEASE Right 10/21/2013   Procedure: RIGHT CARPAL TUNNEL RELEASE;  Surgeon: Wynonia Sours, MD;  Location: [facility];  Service: Orthopedics;  Laterality: Right;   CHOLECYSTECTOMY     COLON SURGERY     COLONOSCOPY     COLOSTOMY REVERSAL  05/21/2018   ileostomy reversal   CYSTOSCOPY WITH STENT PLACEMENT Bilateral 01/13/2018   Procedure: CYSTOSCOPY WITH BILATERAL URETERAL CATHETER PLACEMENT;  Surgeon: Ardis Hughs, MD;  [facility];  Service: Urology;  Laterality: Bilateral;   ESOPHAGEAL MANOMETRY Bilateral 07/19/2014   Procedure: ESOPHAGEAL MANOMETRY (EM);  Surgeon: Jerene Bears, MD;  [facility];  Service: Gastroenterology;  Laterality: Bilateral;   EYE SURGERY Bilateral 2020   cataract   FEMORAL ARTERY STENT     x6   FINGER SURGERY     FOOT SURGERY  04/25/2016    EXCISION BASE 5TH METATARSAL AND PARTIAL CUBOID LEFT FOOT   HERNIA REPAIR     LEFT INGUINAL AND UMBILICAL REPAIRS   HERNIA REPAIR     I & D EXTREMITY Left 04/25/2016   Procedure: EXCISION BASE 5TH METATARSAL AND PARTIAL CUBOID LEFT FOOT;  Surgeon: Newt Minion, MD;  Location: [facility];  Service: Orthopedics;  Laterality: Left;   ILEOSTOMY  01/13/2018   Procedure: ILEOSTOMY;  Surgeon: Clovis Riley, MD;  Location[facility];  Service: General;;   ILEOSTOMY CLOSURE N/A 05/21/2018   Procedure: ILEOSTOMY REVERSAL ERAS PATHWAY;  Surgeon: Clovis Riley, MD;  Location: [facility];  Service: General;  Laterality: N/A;   IR RADIOLOGIST EVAL & MGMT  11/19/2017   IR RADIOLOGIST EVAL & MGMT  12/03/2017   IR RADIOLOGIST EVAL & MGMT  12/18/2017   JOINT REPLACEMENT Right 2001   Total knee   LAMINECTOMY     X 3 LUMBAR AND X 2 CERVICAL SPINE OPERATIONS   LAPAROSCOPIC CHOLECYSTECTOMY W/ CHOLANGIOGRAPHY  11/09/04   SURGEON DR. [place] CATH AND CORONARY ANGIOGRAPHY N/A 09/16/2018   Procedure: LEFT  HEART CATH AND CORONARY ANGIOGRAPHY;  Surgeon: Nigel Mormon, MD;  Location: [facility];  Service: Cardiovascular;  Laterality: N/A;   LEFT HEART CATHETERIZATION WITH CORONARY ANGIOGRAM N/A 10/29/2014   Procedure: LEFT HEART CATHETERIZATION WITH CORONARY ANGIOGRAM;  Surgeon: Laverda Page, MD;  Location: [facility];  Service: Cardiovascular;  Laterality: N/A;   LIGATION OF COMPETING BRANCHES OF ARTERIOVENOUS FISTULA Left 07/21/2020   Procedure: LIGATION OF COMPETING BRANCHES OF LEFT UPPER ARM ARTERIOVENOUS FISTULA;  Surgeon: Waynetta Sandy, MD;  Location: [facility];  Service: Vascular;  Laterality: Left;   LOWER EXTREMITY ANGIOGRAM N/A 03/19/2012   Procedure: LOWER EXTREMITY ANGIOGRAM;  Surgeon: Burnell Blanks, MD;  Location: [facility];  Service: Cardiovascular;  Laterality: N/A;   LOWER EXTREMITY ANGIOGRAPHY N/A 06/20/2021   Procedure: LOWER EXTREMITY ANGIOGRAPHY;  Surgeon: Nigel Mormon, MD;  Location: [facility];  Service: Cardiovascular;  Laterality: N/A;   NECK SURGERY     PARTIAL COLECTOMY N/A 01/13/2018   Procedure: LAPAROSCOPIC ASSISTED   SIGMOID COLECTOMY ILEOSTOMY;  Surgeon: Clovis Riley, MD;  [facility];  Service: General;  Laterality: N/A;   PENILE PROSTHESIS IMPLANT  08/14/05   INFRAPUBIC INSERTION OF INFLATABLE PENILE PROSTHESIS; SURGEON DR. Amalia Hailey   PENILE PROSTHESIS IMPLANT     PERCUTANEOUS CORONARY STENT INTERVENTION (PCI-S) Right 10/29/2014   Procedure: PERCUTANEOUS CORONARY STENT INTERVENTION (PCI-S);  Surgeon: Laverda Page,  MD;  Location: [facility];  Service: Cardiovascular;  Laterality: Right;   PERIPHERAL VASCULAR INTERVENTION Left 06/23/2021   Procedure: PERIPHERAL VASCULAR INTERVENTION;  Surgeon: Nigel Mormon, MD;  Location: [facility];  Service: Cardiovascular;  Laterality: Left;   REMOVAL OF PENILE PROSTHESIS N/A 06/14/2021   Procedure: Removal of THREE piece inflatable penile prosthesis;   Surgeon: Lucas Mallow, MD;  Location: [facility];  Service: Urology;  Laterality: N/A;   SHOULDER ARTHROSCOPY     SPINE SURGERY     TOE AMPUTATION Left    TONSILLECTOMY     TOTAL KNEE ARTHROPLASTY  07/2002   RIGHT KNEE ; SURGEON  DR. [name] ALSO HAD ARTHROSCOPIC RIGHT KNEE IN  10/2001   TOTAL KNEE ARTHROPLASTY     ULNAR NERVE TRANSPOSITION Right 10/21/2013   Procedure: RIGHT ELBOW  ULNAR NERVE DECOMPRESSION;  Surgeon: Wynonia Sours, MD;  Location: [facility];  Service: Orthopedics;  Laterality: Right;   Social History   Occupational History   Occupation: Magazine features editor: UNEMPLOYED  Tobacco Use   Smoking status: Former    Packs/day: 2.00    Years: 35.00    Pack years: 70.00    Types: Cigarettes    Quit date: 10/28/2011    Years since quitting: 10.0   Smokeless tobacco: Never  Vaping Use   Vaping Use: Some days   Substances: Nicotine  Substance and Sexual Activity   Alcohol use: Not Currently    Comment: "not in a long time"   Drug use: Never   Sexual activity: Yes    Birth control/protection: Implant    Comment: penile implant

## 2021-10-27 ENCOUNTER — Ambulatory Visit (INDEPENDENT_AMBULATORY_CARE_PROVIDER_SITE_OTHER): Payer: 59 | Admitting: Family

## 2021-10-27 ENCOUNTER — Other Ambulatory Visit: Payer: Self-pay

## 2021-10-27 ENCOUNTER — Encounter: Payer: Self-pay | Admitting: Family

## 2021-10-27 DIAGNOSIS — L97521 Non-pressure chronic ulcer of other part of left foot limited to breakdown of skin: Secondary | ICD-10-CM | POA: Diagnosis not present

## 2021-10-27 NOTE — Progress Notes (Signed)
Office Visit Note   Patient: [name]           Date of Birth: 05-29-1956           MRN: [id number] Visit Date: 10/27/2021              Requested by: [name], PA-C [place],  [place] [id number] PCP: [name], PA-C  No chief complaint on file.     HPI: Patient is a 66 year old gentleman who presents in follow-up for ischemic ulcers lateral left foot with venous insufficiency undergoing compression wraps status post endovascular intervention.  Assessment & Plan: Visit Diagnoses:  No diagnosis found.   Plan: Plan: We will continue with silver cell over the lateral foot wounds with a Dynaflex wrap.  Follow-Up Instructions: Return in about 3 days (around 10/30/2021).   Ortho Exam  Patient is alert, oriented, no adenopathy, well-dressed, normal affect, normal respiratory effort. Examination of the lateral foot ulcers continue to improve with excellent granulation tissue there is no cellulitis no exposed bone or tendon.  The proximal ulcer is 18 mm x 1 cm and distal has a 6 mm in diameter ulcer. These do not probe. Imaging: No results found. No images are attached to the encounter.  Labs: Lab Results  Component Value Date   HGBA1C 7.9 (H) 06/14/2021   HGBA1C 5.7 (H) 09/16/2018   HGBA1C 7.7 (H) 05/15/2018   ESRSEDRATE 40 (H) 09/03/2021   ESRSEDRATE 17 (H) 06/24/2021   ESRSEDRATE 35 (H) 09/03/2016   CRP 2.7 (H) 09/07/2021   CRP 6.4 (H) 09/06/2021   CRP 8.4 (H) 09/05/2021   REPTSTATUS 09/07/2021 FINAL 09/02/2021   GRAMSTAIN  06/14/2021    NO ORGANISMS SEEN SQUAMOUS EPITHELIAL CELLS PRESENT ABUNDANT WBC PRESENT,BOTH PMN AND MONONUCLEAR MODERATE GRAM POSITIVE COCCI    CULT  09/02/2021    NO GROWTH 5 DAYS Performed at [facility], [place] [address], [place], [place] [address]    LABORGA ENTEROCOCCUS FAECALIS 06/14/2021     Lab Results  Component Value Date   ALBUMIN 3.0 (L) 09/07/2021   ALBUMIN 3.1 (L) 09/06/2021   ALBUMIN  2.7 (L) 09/05/2021   PREALBUMIN 19.0 05/04/2016   PREALBUMIN 25.0 12/16/2015    Lab Results  Component Value Date   MG 1.9 05/24/2018   MG 2.1 05/23/2018   MG 1.3 (L) 05/22/2018   No results found for: VD25OH  Lab Results  Component Value Date   PREALBUMIN 19.0 05/04/2016   PREALBUMIN 25.0 12/16/2015   CBC EXTENDED Latest Ref Rng & Units 09/07/2021 09/06/2021 09/05/2021  WBC 4.0 - 10.5 K/uL 4.8 2.3(L) 3.8(L)  RBC 4.22 - 5.81 MIL/uL 3.72(L) 3.96(L) 3.35(L)  HGB 13.0 - 17.0 g/dL 11.1(L) 11.9(L) 10.2(L)  HCT 39.0 - 52.0 % 33.0(L) 34.9(L) 30.1(L)  PLT 150 - 400 K/uL 151 117(L) 103(L)  NEUTROABS 1.7 - 7.7 K/uL 3.6 1.9 2.1  LYMPHSABS 0.7 - 4.0 K/uL 0.8 0.3(L) 1.1     There is no height or weight on file to calculate BMI.  Orders:  No orders of the defined types were placed in this encounter.  No orders of the defined types were placed in this encounter.    Procedures: No procedures performed  Clinical Data: No additional findings.  ROS:  All other systems negative, except as noted in the HPI. Review of Systems  Objective: Vital Signs: There were no vitals taken for this visit.  Specialty Comments:  No specialty comments available.  PMFS History: Patient  Active Problem List   Diagnosis Date Noted   Acute metabolic encephalopathy 16/96/7893   COVID 09/02/2021   Sepsis ([place]) 09/02/2021   Severe sepsis with lactic acidosis ([place]) 06/14/2021   Infection associated with implanted penile prosthesis ([place]) 06/14/2021   ESRD on hemodialysis ([place]) 06/14/2021   Paroxysmal atrial fibrillation with RVR ([place]) 06/14/2021   Carotid stenosis 06/15/2020   Dyslipidemia 06/15/2020   Severe nonproliferative diabetic retinopathy of right eye, with macular edema, associated with type 2 diabetes mellitus ([place]) 01/18/2020   Severe nonproliferative diabetic retinopathy of left eye, with macular edema, associated with type 2 diabetes mellitus ([place]) 01/04/2020   Retinal hemorrhage of  right eye 01/04/2020   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   Pseudoclaudication 11/15/2018   Essential hypertension 09/18/2018   S/P BKA (below knee amputation), right ([place]) 09/18/2018   Amputation of toe of left foot ([place]) 09/18/2018   Peripheral artery disease ([place]) 09/18/2018   S/P carotid endarterectomy 09/18/2018   OSA on CPAP 09/18/2018   Chronic back pain 09/18/2018   Status post reversal of ileostomy 05/21/2018   Normocytic anemia 02/14/2018   Intra-abdominal abscess ([place]) 11/04/2017   Chronic venous hypertension (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 ([place]) 12/11/2016   Ischemic ulcer of left foot, limited to breakdown of skin ([place]) 11/12/2016   Unilateral primary osteoarthritis, left knee 10/11/2016   History of right below knee amputation ([place]) 07/12/2016   Acute osteomyelitis of left foot ([place])    Diabetic foot infection ([place]) 05/04/2016   Insulin dependent type 2 diabetes mellitus ([place]) 12/16/2015   Amputated toe ([place]) 10/21/2015   Leg edema, left 09/03/2015   CAD (dz of distal, mid and proximal RCA with implantation of 3 overlapping drug-eluting stent,) 09/03/2015   Chronic diastolic heart failure, NYHA class 2 ([place]) 09/03/2015   Angina pectoris associated with type 2 diabetes mellitus ([place]) 10/28/2014   Hyperlipidemia 08/25/2014   Limb pain 03/20/2013   DJD (degenerative joint disease) 09/25/2012   Migraine 09/25/2012   Neuropathy 09/25/2012   Restless legs syndrome (RLS) 09/25/2012   Chronic obstructive pulmonary disease, unspecified ([place]) 04/25/2012   Unknown cause of morbidity or mortality 04/25/2012   Chronic total occlusion of artery of the extremities ([place]) 04/08/2012   Onychomycosis 02/01/2012   Occlusion and stenosis of carotid artery without mention of cerebral infarction 06/12/2011   GERD (gastroesophageal reflux disease) 05/08/2011   Barrett's esophagus without dysplasia 05/08/2011   Former tobacco use 02/01/2011   Past Medical  History:  Diagnosis Date   Carotid artery occlusion 11/10/10   LEFT CAROTID ENDARTERECTOMY   Chronic kidney disease    Complication of anesthesia    BP WENT UP AT [facility] "   COPD (chronic obstructive pulmonary disease) ([place])    pt denies this dx as of 06/01/20 - no inhaler    Diabetes mellitus without complication ([place])    Diverticulitis    Diverticulosis of colon (without mention of hemorrhage)    DJD (degenerative joint disease)    knees/hands/feet/back/neck   Fatty liver    Full dentures    GERD (gastroesophageal reflux disease)    H/O hiatal hernia    History of blood transfusion    with a past surical procedure per patient 06/01/20   Hyperlipidemia    Hypertension    Neuromuscular disorder ([place])    peripheral neuropathy   Non-pressure chronic ulcer of other part of left foot limited to breakdown of skin ([place]) 11/12/2016   Osteomyelitis (H[facility])    left 5th metatarsal  PAD (peripheral artery disease) ([place])    Distal aortogram June 2012. Atherectomy left popliteal artery July 2012.    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 Before age 54   Hyperlipidemia Brother    Hypertension Brother    Hypertension Son    Arthritis Other        GRANDMOTHER   Hypertension Other        OTHER FAMILY MEMBERS    Past Surgical History:  Procedure  Laterality Date   ABDOMINAL AORTOGRAM W/LOWER EXTREMITY N/A 06/23/2021   Procedure: ABDOMINAL AORTOGRAM W/LOWER EXTREMITY;  Surgeon: Nigel Mormon, MD;  Location: [facility];  Service: Cardiovascular;  Laterality: N/A;   AMPUTATION  11/05/2011   Procedure: AMPUTATION RAY;  Surgeon: Wylene Simmer, MD;  Location: [facility];  Service: Orthopedics;  Laterality: Right;  Amputation of Right 4&5th Toes   AMPUTATION Left 11/26/2012   Procedure: AMPUTATION RAY;  Surgeon: Wylene Simmer, MD;  Location: [facility];  Service: Orthopedics;  Laterality: Left;  fourth ray amputation   AMPUTATION Right 08/27/2014   Procedure: Transmetatarsal Amputation;  Surgeon: Newt Minion, MD;  Location: [facility];  Service: Orthopedics;  Laterality: Right;   AMPUTATION Right 01/14/2015   Procedure: AMPUTATION BELOW KNEE;  Surgeon: Newt Minion, MD;  Location: [facility];  Service: Orthopedics;  Laterality: Right;   AMPUTATION Left 10/21/2015   Procedure: Left Foot 5th Ray Amputation;  Surgeon: Newt Minion, MD;  Location: [facility];  Service: Orthopedics;  Laterality: Left;   ANTERIOR FUSION CERVICAL SPINE  02/06/06   C4-5, C5-6, C6-7; SURGEON DR. MAX COHEN   AV FISTULA PLACEMENT Left 06/02/2020   Procedure: ARTERIOVENOUS (AV) FISTULA CREATION LEFT;  Surgeon: Waynetta Sandy, MD;  Location: [facility];  Service: Vascular;  Laterality: Left;   BACK SURGERY     x 3   [place] Left 07/21/2020   Procedure: LEFT UPPER ARM ATERIOVENOUS SUPERFISTULALIZATION;  Surgeon: Waynetta Sandy, MD;  Location: [facility];  Service: Vascular;  Laterality: Left;   BELOW KNEE LEG AMPUTATION Right    CARDIAC CATHETERIZATION  10/31/04   2009   CAROTID ENDARTERECTOMY  11/10/10   CAROTID ENDARTERECTOMY Left 11/10/2010   Subtotal occlusion of left internal carotid artery with left hemispheric transient ischemic attacks.   CAROTID STENT     CARPAL TUNNEL RELEASE Right 10/21/2013   Procedure: RIGHT CARPAL TUNNEL RELEASE;  Surgeon: Wynonia Sours, MD;  Location: [facility];  Service:  Orthopedics;  Laterality: Right;   CHOLECYSTECTOMY     COLON SURGERY     COLONOSCOPY     COLOSTOMY REVERSAL  05/21/2018   ileostomy reversal   CYSTOSCOPY WITH STENT PLACEMENT Bilateral 01/13/2018   Procedure: CYSTOSCOPY WITH BILATERAL URETERAL CATHETER PLACEMENT;  Surgeon: Ardis Hughs, MD;  Location: [facility];  Service: Urology;  Laterality: Bilateral;   ESOPHAGEAL MANOMETRY Bilateral 07/19/2014   Procedure: ESOPHAGEAL MANOMETRY (EM);  Surgeon: Jerene Bears, MD;  Location[facility];  Service: Gastroenterology;  Laterality: Bilateral;   EYE SURGERY Bilateral 2020   cataract   FEMORAL ARTERY STENT     x6   FINGER SURGERY     FOOT SURGERY  04/25/2016    EXCISION BASE 5TH METATARSAL AND PARTIAL CUBOID LEFT FOOT   HERNIA REPAIR     LEFT INGUINAL AND UMBILICAL REPAIRS   HERNIA REPAIR     I & D EXTREMITY Left 04/25/2016   Procedure: EXCISION BASE 5TH METATARSAL AND PARTIAL CUBOID LEFT FOOT;  Surgeon: Newt Minion, MD;  Location: [facility];  Service: Orthopedics;  Laterality: Left;   ILEOSTOMY  01/13/2018   Procedure: ILEOSTOMY;  Surgeon: Clovis Riley, MD;  Location: [facility];  Service: General;;   ILEOSTOMY CLOSURE N/A 05/21/2018   Procedure: ILEOSTOMY REVERSAL ERAS PATHWAY;  Surgeon: Clovis Riley, MD;  Location: [facility];  Service: General;  Laterality: N/A;   IR RADIOLOGIST EVAL & MGMT  11/19/2017   IR RADIOLOGIST EVAL & MGMT  12/03/2017   IR RADIOLOGIST EVAL & MGMT  12/18/2017   JOINT REPLACEMENT Right 2001   Total knee   LAMINECTOMY     X 3 LUMBAR AND X 2 CERVICAL SPINE OPERATIONS   LAPAROSCOPIC CHOLECYSTECTOMY W/ CHOLANGIOGRAPHY  11/09/04   SURGEON DR. Farson CATH AND CORONARY ANGIOGRAPHY N/A 09/16/2018   Procedure: LEFT HEART CATH AND CORONARY ANGIOGRAPHY;  Surgeon: Nigel Mormon, MD;  Location: [facility];  Service: Cardiovascular;  Laterality: N/A;   LEFT HEART CATHETERIZATION WITH  CORONARY ANGIOGRAM N/A 10/29/2014   Procedure: LEFT HEART CATHETERIZATION WITH CORONARY ANGIOGRAM;  Surgeon: Laverda Page, MD;  Location: [facility];  Service: Cardiovascular;  Laterality: N/A;   LIGATION OF COMPETING BRANCHES OF ARTERIOVENOUS FISTULA Left 07/21/2020   Procedure: LIGATION OF COMPETING BRANCHES OF LEFT UPPER ARM ARTERIOVENOUS FISTULA;  Surgeon: Waynetta Sandy, MD;  Location: [facility];  Service: Vascular;  Laterality: Left;   LOWER EXTREMITY ANGIOGRAM N/A 03/19/2012   Procedure: LOWER EXTREMITY ANGIOGRAM;  Surgeon: Burnell Blanks, MD;  Location: [facility];  Service: Cardiovascular;  Laterality: N/A;   LOWER EXTREMITY ANGIOGRAPHY N/A 06/20/2021   Procedure: LOWER EXTREMITY ANGIOGRAPHY;  Surgeon: Nigel Mormon, MD;  Location: [facility];  Service: Cardiovascular;  Laterality: N/A;   NECK SURGERY     PARTIAL COLECTOMY N/A 01/13/2018   Procedure: LAPAROSCOPIC ASSISTED   SIGMOID COLECTOMY ILEOSTOMY;  Surgeon: Clovis Riley, MD;  Location[facility];  Service: General;  Laterality: N/A;   PENILE PROSTHESIS IMPLANT  08/14/05   INFRAPUBIC INSERTION OF INFLATABLE PENILE PROSTHESIS; SURGEON DR. Amalia Hailey   PENILE PROSTHESIS IMPLANT     PERCUTANEOUS CORONARY STENT INTERVENTION (PCI-S) Right 10/29/2014   Procedure: PERCUTANEOUS CORONARY STENT INTERVENTION (PCI-S);  Surgeon: Laverda Page, MD;  Location: [facility];  Service: Cardiovascular;  Laterality: Right;   PERIPHERAL VASCULAR INTERVENTION Left 06/23/2021   Procedure: PERIPHERAL VASCULAR INTERVENTION;  Surgeon: Nigel Mormon, MD;  Location: [facility];  Service: Cardiovascular;  Laterality: Left;   REMOVAL OF PENILE PROSTHESIS N/A 06/14/2021   Procedure: Removal of THREE piece inflatable penile prosthesis;  Surgeon: Lucas Mallow, MD;  Location: [facility];  Service: Urology;  Laterality: N/A;   SHOULDER ARTHROSCOPY     SPINE SURGERY     TOE AMPUTATION Left    TONSILLECTOMY     TOTAL  KNEE ARTHROPLASTY  07/2002   RIGHT KNEE ; SURGEON  DR. [name] ALSO HAD ARTHROSCOPIC RIGHT KNEE IN  10/2001   TOTAL KNEE ARTHROPLASTY     ULNAR NERVE TRANSPOSITION Right 10/21/2013   Procedure: RIGHT ELBOW  ULNAR NERVE DECOMPRESSION;  Surgeon: Wynonia Sours, MD;  Location: [facility];  Service: Orthopedics;  Laterality: Right;   Social History   Occupational History   Occupation: Magazine features editor: UNEMPLOYED  Tobacco Use   Smoking status: Former    Packs/day: 2.00    Years: 35.00    Pack years: 70.00    Types: Cigarettes    Quit date: 10/28/2011    Years since quitting: 10.0   Smokeless tobacco: Never  Vaping Use   Vaping Use: Some days   Substances: Nicotine  Substance and Sexual Activity   Alcohol use: Not Currently    Comment: "not in a long time"   Drug use: Never   Sexual activity: Yes    Birth control/protection: Implant    Comment: penile implant

## 2021-10-30 ENCOUNTER — Other Ambulatory Visit: Payer: Self-pay

## 2021-10-30 ENCOUNTER — Ambulatory Visit (INDEPENDENT_AMBULATORY_CARE_PROVIDER_SITE_OTHER): Payer: 59 | Admitting: Orthopedic Surgery

## 2021-10-30 DIAGNOSIS — L97521 Non-pressure chronic ulcer of other part of left foot limited to breakdown of skin: Secondary | ICD-10-CM

## 2021-11-01 NOTE — Telephone Encounter (Signed)
error 

## 2021-11-03 ENCOUNTER — Other Ambulatory Visit: Payer: Self-pay

## 2021-11-03 ENCOUNTER — Encounter: Payer: Self-pay | Admitting: Family

## 2021-11-03 ENCOUNTER — Ambulatory Visit (INDEPENDENT_AMBULATORY_CARE_PROVIDER_SITE_OTHER): Payer: 59 | Admitting: Family

## 2021-11-03 DIAGNOSIS — I87332 Chronic venous hypertension (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: Secondary | ICD-10-CM

## 2021-11-03 DIAGNOSIS — L97929 Non-pressure chronic ulcer of unspecified part of left lower leg with unspecified severity: Secondary | ICD-10-CM | POA: Diagnosis not present

## 2021-11-03 DIAGNOSIS — L97521 Non-pressure chronic ulcer of other part of left foot limited to breakdown of skin: Secondary | ICD-10-CM | POA: Diagnosis not present

## 2021-11-03 NOTE — Progress Notes (Signed)
Office Visit Note   Patient: [name]           Date of Birth: 04/29/1956           MRN: [id number] Visit Date: 11/03/2021              Requested by: [name], PA-C [address] [place],  [place] [address] PCP: [name], PA-C  No chief complaint on file.     HPI: Patient is a 66 year old gentleman who presents in follow-up for ischemic ulcers lateral left foot with venous insufficiency undergoing compression wraps status post endovascular intervention.    Assessment & Plan: Visit Diagnoses:  No diagnosis found.   Plan: Plan: We will continue with silver cell over the lateral foot wounds with a Dynaflex wrap.  Petrolatum gauze to the shin skin tear  Follow-Up Instructions: No follow-ups on file.   Ortho Exam  Patient is alert, oriented, no adenopathy, well-dressed, normal affect, normal respiratory effort. Examination of the lateral foot ulcers continue to improve with excellent granulation tissue there is no cellulitis no exposed bone or tendon.  Ulcers to the foot are stable in size these are bleeding today after debridement with gauze.  He does have a proximal shin skin tear which is new after removing his compressive wrap this is 15 mm in diameter filled in with bleeding granulation.  Imaging: No results found. No images are attached to the encounter.  Labs: Lab Results  Component Value Date   HGBA1C 7.9 (H) 06/14/2021   HGBA1C 5.7 (H) 09/16/2018   HGBA1C 7.7 (H) 05/15/2018   ESRSEDRATE 40 (H) 09/03/2021   ESRSEDRATE 17 (H) 06/24/2021   ESRSEDRATE 35 (H) 09/03/2016   CRP 2.7 (H) 09/07/2021   CRP 6.4 (H) 09/06/2021   CRP 8.4 (H) 09/05/2021   REPTSTATUS 09/07/2021 FINAL 09/02/2021   GRAMSTAIN  06/14/2021    NO ORGANISMS SEEN SQUAMOUS EPITHELIAL CELLS PRESENT ABUNDANT WBC PRESENT,BOTH PMN AND MONONUCLEAR MODERATE GRAM POSITIVE COCCI    CULT  09/02/2021    NO GROWTH 5 DAYS Performed at [facility], [place] [address]  [place],  [address]    LABORGA ENTEROCOCCUS FAECALIS 06/14/2021     Lab Results  Component Value Date   ALBUMIN 3.0 (L) 09/07/2021   ALBUMIN 3.1 (L) 09/06/2021   ALBUMIN 2.7 (L) 09/05/2021   PREALBUMIN 19.0 05/04/2016   PREALBUMIN 25.0 12/16/2015    Lab Results  Component Value Date   MG 1.9 05/24/2018   MG 2.1 05/23/2018   MG 1.3 (L) 05/22/2018   No results found for: VD25OH  Lab Results  Component Value Date   PREALBUMIN 19.0 05/04/2016   PREALBUMIN 25.0 12/16/2015   CBC EXTENDED Latest Ref Rng & Units 09/07/2021 09/06/2021 09/05/2021  WBC 4.0 - 10.5 K/uL 4.8 2.3(L) 3.8(L)  RBC 4.22 - 5.81 MIL/uL 3.72(L) 3.96(L) 3.35(L)  HGB 13.0 - 17.0 g/dL 11.1(L) 11.9(L) 10.2(L)  HCT 39.0 - 52.0 % 33.0(L) 34.9(L) 30.1(L)  PLT 150 - 400 K/uL 151 117(L) 103(L)  NEUTROABS 1.7 - 7.7 K/uL 3.6 1.9 2.1  LYMPHSABS 0.7 - 4.0 K/uL 0.8 0.3(L) 1.1     There is no height or weight on file to calculate BMI.  Orders:  No orders of the defined types were placed in this encounter.  No orders of the defined types were placed in this encounter.    Procedures: No procedures performed  Clinical Data: No additional findings.  ROS:  All other systems negative, except as noted  in the HPI. Review of Systems  Objective: Vital Signs: There were no vitals taken for this visit.  Specialty Comments:  No specialty comments available.  PMFS History: Patient Active Problem List   Diagnosis Date Noted   Acute metabolic encephalopathy 74/25/9563   COVID 09/02/2021   Sepsis ([place]) 09/02/2021   Severe sepsis with lactic acidosis ([place]) 06/14/2021   Infection associated with implanted penile prosthesis ([place]) 06/14/2021   ESRD on hemodialysis ([place]) 06/14/2021   Paroxysmal atrial fibrillation with RVR ([place]) 06/14/2021   Carotid stenosis 06/15/2020   Dyslipidemia 06/15/2020   Severe nonproliferative diabetic retinopathy of right eye, with macular edema, associated with type 2 diabetes mellitus  ([place]) 01/18/2020   Severe nonproliferative diabetic retinopathy of left eye, with macular edema, associated with type 2 diabetes mellitus ([place]) 01/04/2020   Retinal hemorrhage of right eye 01/04/2020   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   Pseudoclaudication 11/15/2018   Essential hypertension 09/18/2018   S/P BKA (below knee amputation), right ([place]) 09/18/2018   Amputation of toe of left foot ([place]) 09/18/2018   Peripheral artery disease ([place]) 09/18/2018   S/P carotid endarterectomy 09/18/2018   OSA on CPAP 09/18/2018   Chronic back pain 09/18/2018   Status post reversal of ileostomy 05/21/2018   Normocytic anemia 02/14/2018   Intra-abdominal abscess ([place]) 11/04/2017   Chronic venous hypertension (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 ([place]) 12/11/2016   Ischemic ulcer of left foot, limited to breakdown of skin ([place]) 11/12/2016   Unilateral primary osteoarthritis, left knee 10/11/2016   History of right below knee amputation ([place]) 07/12/2016   Acute osteomyelitis of left foot ([place])    Diabetic foot infection ([place]) 05/04/2016   Insulin dependent type 2 diabetes mellitus ([place]) 12/16/2015   Amputated toe ([address]) 10/21/2015   Leg edema, left 09/03/2015   CAD (dz of distal, mid and proximal RCA with implantation of 3 overlapping drug-eluting stent,) 09/03/2015   Chronic diastolic heart failure, NYHA class 2 ([place]) 09/03/2015   Angina pectoris associated with type 2 diabetes mellitus ([place]) 10/28/2014   Hyperlipidemia 08/25/2014   Limb pain 03/20/2013   DJD (degenerative joint disease) 09/25/2012   Migraine 09/25/2012   Neuropathy 09/25/2012   Restless legs syndrome (RLS) 09/25/2012   Chronic obstructive pulmonary disease, unspecified ([place]) 04/25/2012   Unknown cause of morbidity or mortality 04/25/2012   Chronic total occlusion of artery of the extremities ([place]) 04/08/2012   Onychomycosis 02/01/2012   Occlusion and stenosis of carotid artery without mention of cerebral  infarction 06/12/2011   GERD (gastroesophageal reflux disease) 05/08/2011   Barrett's esophagus without dysplasia 05/08/2011   Former tobacco use 02/01/2011   Past Medical History:  Diagnosis Date   Carotid artery occlusion 11/10/10   LEFT CAROTID ENDARTERECTOMY   Chronic kidney disease    Complication of anesthesia    BP WENT UP AT [facility] "   COPD (chronic obstructive pulmonary disease) ([place])    pt denies this dx as of 06/01/20 - no inhaler    Diabetes mellitus without complication ([place])    Diverticulitis    Diverticulosis of colon (without mention of hemorrhage)    DJD (degenerative joint disease)    knees/hands/feet/back/neck   Fatty liver    Full dentures    GERD (gastroesophageal reflux disease)    H/O hiatal hernia    History of blood transfusion    with a past surical procedure per patient 06/01/20   Hyperlipidemia    Hypertension    Neuromuscular disorder ([place])  peripheral neuropathy   Non-pressure chronic ulcer of other part of left foot limited to breakdown of skin ([place]) 11/12/2016   Osteomyelitis ([facility])    left 5th metatarsal   PAD (peripheral artery disease) ([place])    Distal aortogram June 2012. Atherectomy left popliteal artery July 2012.    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 Before age 46   Hyperlipidemia Brother     Hypertension Brother    Hypertension Son    Arthritis Other        GRANDMOTHER   Hypertension Other        OTHER FAMILY MEMBERS    Past Surgical History:  Procedure Laterality Date   ABDOMINAL AORTOGRAM W/LOWER EXTREMITY N/A 06/23/2021   Procedure: ABDOMINAL AORTOGRAM W/LOWER EXTREMITY;  Surgeon: Nigel Mormon, MD;  Location: [facility];  Service: Cardiovascular;  Laterality: N/A;   AMPUTATION  11/05/2011   Procedure: AMPUTATION RAY;  Surgeon: Wylene Simmer, MD;  Location: [facility];  Service: Orthopedics;  Laterality: Right;  Amputation of Right 4&5th Toes   AMPUTATION Left 11/26/2012   Procedure: AMPUTATION RAY;  Surgeon: Wylene Simmer, MD;  Location: [facility];  Service: Orthopedics;  Laterality: Left;  fourth ray amputation   AMPUTATION Right 08/27/2014   Procedure: Transmetatarsal Amputation;  Surgeon: Newt Minion, MD;  Location: [facility];  Service: Orthopedics;  Laterality: Right;   AMPUTATION Right 01/14/2015   Procedure: AMPUTATION BELOW KNEE;  Surgeon: Newt Minion, MD;  Location: [facility];  Service: Orthopedics;  Laterality: Right;   AMPUTATION Left 10/21/2015   Procedure: Left Foot 5th Ray Amputation;  Surgeon: Newt Minion, MD;  Location: [facility];  Service: Orthopedics;  Laterality: Left;   ANTERIOR FUSION CERVICAL SPINE  02/06/06   C4-5, C5-6, C6-7; SURGEON DR. MAX COHEN   AV FISTULA PLACEMENT Left 06/02/2020   Procedure: ARTERIOVENOUS (AV) FISTULA CREATION LEFT;  Surgeon: Waynetta Sandy, MD;  Location: [facility];  Service: Vascular;  Laterality: Left;   BACK SURGERY     x 3   [place] Left 07/21/2020   Procedure: LEFT UPPER ARM ATERIOVENOUS SUPERFISTULALIZATION;  Surgeon: Waynetta Sandy, MD;  Location: [facility];  Service: Vascular;  Laterality: Left;   BELOW KNEE LEG AMPUTATION Right    CARDIAC CATHETERIZATION  10/31/04   2009   CAROTID ENDARTERECTOMY  11/10/10   CAROTID ENDARTERECTOMY Left 11/10/2010   Subtotal occlusion of left internal  carotid artery with left hemispheric transient ischemic attacks.  CAROTID STENT     CARPAL TUNNEL RELEASE Right 10/21/2013   Procedure: RIGHT CARPAL TUNNEL RELEASE;  Surgeon: Wynonia Sours, MD;  Location: [facility];  Service: Orthopedics;  Laterality: Right;   CHOLECYSTECTOMY     COLON SURGERY     COLONOSCOPY     COLOSTOMY REVERSAL  05/21/2018   ileostomy reversal   CYSTOSCOPY WITH STENT PLACEMENT Bilateral 01/13/2018   Procedure: CYSTOSCOPY WITH BILATERAL URETERAL CATHETER PLACEMENT;  Surgeon: Ardis Hughs, MD;  Location: [facility];  Service: Urology;  Laterality: Bilateral;   ESOPHAGEAL MANOMETRY Bilateral 07/19/2014   Procedure: ESOPHAGEAL MANOMETRY (EM);  Surgeon: Jerene Bears, MD;  [facility];  Service: Gastroenterology;  Laterality: Bilateral;   EYE SURGERY Bilateral 2020   cataract   FEMORAL ARTERY STENT     x6   FINGER SURGERY     FOOT SURGERY  04/25/2016    EXCISION BASE 5TH METATARSAL AND PARTIAL CUBOID LEFT FOOT   HERNIA REPAIR     LEFT INGUINAL AND UMBILICAL REPAIRS   HERNIA REPAIR     I & D EXTREMITY Left 04/25/2016   Procedure: EXCISION BASE 5TH METATARSAL AND PARTIAL CUBOID LEFT FOOT;  Surgeon: Newt Minion, MD;  Location: [facility];  Service: Orthopedics;  Laterality: Left;   ILEOSTOMY  01/13/2018   Procedure: ILEOSTOMY;  Surgeon: Clovis Riley, MD;  Location: [facility];  Service: General;;   ILEOSTOMY CLOSURE N/A 05/21/2018   Procedure: ILEOSTOMY REVERSAL ERAS PATHWAY;  Surgeon: Clovis Riley, MD;  Location: [facility];  Service: General;  Laterality: N/A;   IR RADIOLOGIST EVAL & MGMT  11/19/2017   IR RADIOLOGIST EVAL & MGMT  12/03/2017   IR RADIOLOGIST EVAL & MGMT  12/18/2017   JOINT REPLACEMENT Right 2001   Total knee   LAMINECTOMY     X 3 LUMBAR AND X 2 CERVICAL SPINE OPERATIONS   LAPAROSCOPIC CHOLECYSTECTOMY W/ CHOLANGIOGRAPHY  11/09/04   SURGEON DR. Starke CATH AND CORONARY ANGIOGRAPHY N/A 09/16/2018   Procedure: LEFT  HEART CATH AND CORONARY ANGIOGRAPHY;  Surgeon: Nigel Mormon, MD;  Location: [facility];  Service: Cardiovascular;  Laterality: N/A;   LEFT HEART CATHETERIZATION WITH CORONARY ANGIOGRAM N/A 10/29/2014   Procedure: LEFT HEART CATHETERIZATION WITH CORONARY ANGIOGRAM;  Surgeon: Laverda Page, MD;  Location: [facility];  Service: Cardiovascular;  Laterality: N/A;   LIGATION OF COMPETING BRANCHES OF ARTERIOVENOUS FISTULA Left 07/21/2020   Procedure: LIGATION OF COMPETING BRANCHES OF LEFT UPPER ARM ARTERIOVENOUS FISTULA;  Surgeon: Waynetta Sandy, MD;  Location: [facility];  Service: Vascular;  Laterality: Left;   LOWER EXTREMITY ANGIOGRAM N/A 03/19/2012   Procedure: LOWER EXTREMITY ANGIOGRAM;  Surgeon: Burnell Blanks, MD;  Location: [facility];  Service: Cardiovascular;  Laterality: N/A;   LOWER EXTREMITY ANGIOGRAPHY N/A 06/20/2021   Procedure: LOWER EXTREMITY ANGIOGRAPHY;  Surgeon: Nigel Mormon, MD;  Location: [facility];  Service: Cardiovascular;  Laterality: N/A;   NECK SURGERY     PARTIAL COLECTOMY N/A 01/13/2018   Procedure: LAPAROSCOPIC ASSISTED   SIGMOID COLECTOMY ILEOSTOMY;  Surgeon: Clovis Riley, MD;  Location[facility];  Service: General;  Laterality: N/A;   PENILE PROSTHESIS IMPLANT  08/14/05   INFRAPUBIC INSERTION OF INFLATABLE PENILE PROSTHESIS; SURGEON DR. Amalia Hailey   PENILE PROSTHESIS IMPLANT     PERCUTANEOUS CORONARY STENT INTERVENTION (PCI-S) Right 10/29/2014   Procedure: PERCUTANEOUS CORONARY STENT INTERVENTION (PCI-S);  Surgeon: Laverda Page,  MD;  Location: [facility];  Service: Cardiovascular;  Laterality: Right;   PERIPHERAL VASCULAR INTERVENTION Left 06/23/2021   Procedure: PERIPHERAL VASCULAR INTERVENTION;  Surgeon: Nigel Mormon, MD;  Location: [facility];  Service: Cardiovascular;  Laterality: Left;   REMOVAL OF PENILE PROSTHESIS N/A 06/14/2021   Procedure: Removal of THREE piece inflatable penile prosthesis;   Surgeon: Lucas Mallow, MD;  Location: [facility];  Service: Urology;  Laterality: N/A;   SHOULDER ARTHROSCOPY     SPINE SURGERY     TOE AMPUTATION Left    TONSILLECTOMY     TOTAL KNEE ARTHROPLASTY  07/2002   RIGHT KNEE ; SURGEON  DR. [name] ALSO HAD ARTHROSCOPIC RIGHT KNEE IN  10/2001   TOTAL KNEE ARTHROPLASTY     ULNAR NERVE TRANSPOSITION Right 10/21/2013   Procedure: RIGHT ELBOW  ULNAR NERVE DECOMPRESSION;  Surgeon: Wynonia Sours, MD;  Location: [facility];  Service: Orthopedics;  Laterality: Right;   Social History   Occupational History   Occupation: Magazine features editor: UNEMPLOYED  Tobacco Use   Smoking status: Former    Packs/day: 2.00    Years: 35.00    Pack years: 70.00    Types: Cigarettes    Quit date: 10/28/2011    Years since quitting: 10.0   Smokeless tobacco: Never  Vaping Use   Vaping Use: Some days   Substances: Nicotine  Substance and Sexual Activity   Alcohol use: Not Currently    Comment: "not in a long time"   Drug use: Never   Sexual activity: Yes    Birth control/protection: Implant    Comment: penile implant

## 2021-11-07 ENCOUNTER — Encounter: Payer: Self-pay | Admitting: Orthopedic Surgery

## 2021-11-07 NOTE — Progress Notes (Signed)
Office Visit Note   Patient: [name]           Date of Birth: 22-Mar-1956           MRN: [id number] Visit Date: 10/30/2021              Requested by: [name], PA-C [address] [place],  [place] [address] PCP: [name], PA-C  Chief Complaint  Patient presents with   Left Foot - Wound Check      HPI: Patient is a 66 year old gentleman who presents in follow-up for a lateral ischemic ulcer of left foot he is status post revascularization to the left lower extremity he is currently been in compression wraps.  Assessment & Plan: Visit Diagnoses:  1. Ischemic ulcer of left foot, limited to breakdown of skin ([place])     Plan: We will continue with an additional compression wrap.  Follow-Up Instructions: Return in about 1 week (around 11/06/2021).   Ortho Exam  Patient is alert, oriented, no adenopathy, well-dressed, normal affect, normal respiratory effort. Examination the lateral wound continues to heal on the left foot there is no cellulitis or drainage no exposed bone or tendon the wound is less than a centimeter in diameter.  Imaging: No results found. No images are attached to the encounter.  Labs: Lab Results  Component Value Date   HGBA1C 7.9 (H) 06/14/2021   HGBA1C 5.7 (H) 09/16/2018   HGBA1C 7.7 (H) 05/15/2018   ESRSEDRATE 40 (H) 09/03/2021   ESRSEDRATE 17 (H) 06/24/2021   ESRSEDRATE 35 (H) 09/03/2016   CRP 2.7 (H) 09/07/2021   CRP 6.4 (H) 09/06/2021   CRP 8.4 (H) 09/05/2021   REPTSTATUS 09/07/2021 FINAL 09/02/2021   GRAMSTAIN  06/14/2021    NO ORGANISMS SEEN SQUAMOUS EPITHELIAL CELLS PRESENT ABUNDANT WBC PRESENT,BOTH PMN AND MONONUCLEAR MODERATE GRAM POSITIVE COCCI    CULT  09/02/2021    NO GROWTH 5 DAYS Performed at [facility], [place] [address], [place], [place] [address]    LABORGA ENTEROCOCCUS FAECALIS 06/14/2021     Lab Results  Component Value Date   ALBUMIN 3.0 (L) 09/07/2021   ALBUMIN 3.1 (L) 09/06/2021   ALBUMIN  2.7 (L) 09/05/2021   PREALBUMIN 19.0 05/04/2016   PREALBUMIN 25.0 12/16/2015    Lab Results  Component Value Date   MG 1.9 05/24/2018   MG 2.1 05/23/2018   MG 1.3 (L) 05/22/2018   No results found for: VD25OH  Lab Results  Component Value Date   PREALBUMIN 19.0 05/04/2016   PREALBUMIN 25.0 12/16/2015   CBC EXTENDED Latest Ref Rng & Units 09/07/2021 09/06/2021 09/05/2021  WBC 4.0 - 10.5 K/uL 4.8 2.3(L) 3.8(L)  RBC 4.22 - 5.81 MIL/uL 3.72(L) 3.96(L) 3.35(L)  HGB 13.0 - 17.0 g/dL 11.1(L) 11.9(L) 10.2(L)  HCT 39.0 - 52.0 % 33.0(L) 34.9(L) 30.1(L)  PLT 150 - 400 K/uL 151 117(L) 103(L)  NEUTROABS 1.7 - 7.7 K/uL 3.6 1.9 2.1  LYMPHSABS 0.7 - 4.0 K/uL 0.8 0.3(L) 1.1     There is no height or weight on file to calculate BMI.  Orders:  No orders of the defined types were placed in this encounter.  No orders of the defined types were placed in this encounter.    Procedures: No procedures performed  Clinical Data: No additional findings.  ROS:  All other systems negative, except as noted in the HPI. Review of Systems  Objective: Vital Signs: There were no vitals taken for this visit.  Specialty Comments:  No specialty comments available.  PMFS History: Patient Active Problem List   Diagnosis Date Noted   Acute metabolic encephalopathy 71/69/6789   COVID 09/02/2021   Sepsis ([place]) 09/02/2021   Severe sepsis with lactic acidosis ([place]) 06/14/2021   Infection associated with implanted penile prosthesis ([place]) 06/14/2021   ESRD on hemodialysis ([place]) 06/14/2021   Paroxysmal atrial fibrillation with RVR ([place]) 06/14/2021   Carotid stenosis 06/15/2020   Dyslipidemia 06/15/2020   Severe nonproliferative diabetic retinopathy of right eye, with macular edema, associated with type 2 diabetes mellitus ([place]) 01/18/2020   Severe nonproliferative diabetic retinopathy of left eye, with macular edema, associated with type 2 diabetes mellitus ([place]) 01/04/2020   Retinal hemorrhage of  right eye 01/04/2020   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   Pseudoclaudication 11/15/2018   Essential hypertension 09/18/2018   S/P BKA (below knee amputation), right ([place]) 09/18/2018   Amputation of toe of left foot ([place]) 09/18/2018   Peripheral artery disease ([place]) 09/18/2018   S/P carotid endarterectomy 09/18/2018   OSA on CPAP 09/18/2018   Chronic back pain 09/18/2018   Status post reversal of ileostomy 05/21/2018   Normocytic anemia 02/14/2018   Intra-abdominal abscess ([place]) 11/04/2017   Chronic venous hypertension (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 ([place]) 12/11/2016   Ischemic ulcer of left foot, limited to breakdown of skin ([place]) 11/12/2016   Unilateral primary osteoarthritis, left knee 10/11/2016   History of right below knee amputation ([place]) 07/12/2016   Acute osteomyelitis of left foot ([place])    Diabetic foot infection ([place]) 05/04/2016   Insulin dependent type 2 diabetes mellitus ([place]) 12/16/2015   Amputated toe ([place]) 10/21/2015   Leg edema, left 09/03/2015   CAD (dz of distal, mid and proximal RCA with implantation of 3 overlapping drug-eluting stent,) 09/03/2015   Chronic diastolic heart failure, NYHA class 2 ([place]) 09/03/2015   Angina pectoris associated with type 2 diabetes mellitus ([place]) 10/28/2014   Hyperlipidemia 08/25/2014   Limb pain 03/20/2013   DJD (degenerative joint disease) 09/25/2012   Migraine 09/25/2012   Neuropathy 09/25/2012   Restless legs syndrome (RLS) 09/25/2012   Chronic obstructive pulmonary disease, unspecified ([place]) 04/25/2012   Unknown cause of morbidity or mortality 04/25/2012   Chronic total occlusion of artery of the extremities ([place]) 04/08/2012   Onychomycosis 02/01/2012   Occlusion and stenosis of carotid artery without mention of cerebral infarction 06/12/2011   GERD (gastroesophageal reflux disease) 05/08/2011   Barrett's esophagus without dysplasia 05/08/2011   Former tobacco use 02/01/2011   Past Medical  History:  Diagnosis Date   Carotid artery occlusion 11/10/10   LEFT CAROTID ENDARTERECTOMY   Chronic kidney disease    Complication of anesthesia    BP WENT UP AT [facility] "   COPD (chronic obstructive pulmonary disease) ([place])    pt denies this dx as of 06/01/20 - no inhaler    Diabetes mellitus without complication ([place])    Diverticulitis    Diverticulosis of colon (without mention of hemorrhage)    DJD (degenerative joint disease)    knees/hands/feet/back/neck   Fatty liver    Full dentures    GERD (gastroesophageal reflux disease)    H/O hiatal hernia    History of blood transfusion    with a past surical procedure per patient 06/01/20   Hyperlipidemia    Hypertension    Neuromuscular disorder ([place])    peripheral neuropathy   Non-pressure chronic ulcer of other part of left foot limited to breakdown of skin ([place]) 11/12/2016  Osteomyelitis ([place])    left 5th metatarsal   PAD (peripheral artery disease) ([place])    Distal aortogram June 2012. Atherectomy left popliteal artery July 2012.    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 Before age 35   Hyperlipidemia Brother    Hypertension Brother    Hypertension Son    Arthritis Other        GRANDMOTHER   Hypertension Other        OTHER FAMILY MEMBERS    Past Surgical History:  Procedure  Laterality Date   ABDOMINAL AORTOGRAM W/LOWER EXTREMITY N/A 06/23/2021   Procedure: ABDOMINAL AORTOGRAM W/LOWER EXTREMITY;  Surgeon: Nigel Mormon, MD;  Location: [facility];  Service: Cardiovascular;  Laterality: N/A;   AMPUTATION  11/05/2011   Procedure: AMPUTATION RAY;  Surgeon: Wylene Simmer, MD;  Location: [facility];  Service: Orthopedics;  Laterality: Right;  Amputation of Right 4&5th Toes   AMPUTATION Left 11/26/2012   Procedure: AMPUTATION RAY;  Surgeon: Wylene Simmer, MD;  Location: [facility];  Service: Orthopedics;  Laterality: Left;  fourth ray amputation   AMPUTATION Right 08/27/2014   Procedure: Transmetatarsal Amputation;  Surgeon: Newt Minion, MD;  Location: [facility];  Service: Orthopedics;  Laterality: Right;   AMPUTATION Right 01/14/2015   Procedure: AMPUTATION BELOW KNEE;  Surgeon: Newt Minion, MD;  Location: [facility];  Service: Orthopedics;  Laterality: Right;   AMPUTATION Left 10/21/2015   Procedure: Left Foot 5th Ray Amputation;  Surgeon: Newt Minion, MD;  Location: [facility];  Service: Orthopedics;  Laterality: Left;   ANTERIOR FUSION CERVICAL SPINE  02/06/06   C4-5, C5-6, C6-7; SURGEON DR. MAX COHEN   AV FISTULA PLACEMENT Left 06/02/2020   Procedure: ARTERIOVENOUS (AV) FISTULA CREATION LEFT;  Surgeon: Waynetta Sandy, MD;  Location: [facility];  Service: Vascular;  Laterality: Left;   BACK SURGERY     x 3   [place] Left 07/21/2020   Procedure: LEFT UPPER ARM ATERIOVENOUS SUPERFISTULALIZATION;  Surgeon: Waynetta Sandy, MD;  Location: [facility];  Service: Vascular;  Laterality: Left;   BELOW KNEE LEG AMPUTATION Right    CARDIAC CATHETERIZATION  10/31/04   2009   CAROTID ENDARTERECTOMY  11/10/10   CAROTID ENDARTERECTOMY Left 11/10/2010   Subtotal occlusion of left internal carotid artery with left hemispheric transient ischemic attacks.   CAROTID STENT     CARPAL TUNNEL RELEASE Right 10/21/2013   Procedure: RIGHT CARPAL TUNNEL RELEASE;  Surgeon: Dillard Essex  Fredna Dow, MD;  Location: [facility];  Service: Orthopedics;  Laterality: Right;   CHOLECYSTECTOMY     COLON SURGERY     COLONOSCOPY     COLOSTOMY REVERSAL  05/21/2018   ileostomy reversal   CYSTOSCOPY WITH STENT PLACEMENT Bilateral 01/13/2018   Procedure: CYSTOSCOPY WITH BILATERAL URETERAL CATHETER PLACEMENT;  Surgeon: Ardis Hughs, MD;  Location: [facility];  Service: Urology;  Laterality: Bilateral;   ESOPHAGEAL MANOMETRY Bilateral 07/19/2014   Procedure: ESOPHAGEAL MANOMETRY (EM);  Surgeon: Jerene Bears, MD;  [facility];  Service: Gastroenterology;  Laterality: Bilateral;   EYE SURGERY Bilateral 2020   cataract   FEMORAL ARTERY STENT     x6   FINGER SURGERY     FOOT SURGERY  04/25/2016    EXCISION BASE 5TH METATARSAL AND PARTIAL CUBOID LEFT FOOT   HERNIA REPAIR     LEFT INGUINAL AND UMBILICAL REPAIRS   HERNIA REPAIR     I & D EXTREMITY Left 04/25/2016   Procedure: EXCISION BASE 5TH METATARSAL AND PARTIAL CUBOID LEFT FOOT;  Surgeon: Newt Minion, MD;  Location: [facility];  Service: Orthopedics;  Laterality: Left;   ILEOSTOMY  01/13/2018   Procedure: ILEOSTOMY;  Surgeon: Clovis Riley, MD;  Location[facility];  Service: General;;   ILEOSTOMY CLOSURE N/A 05/21/2018   Procedure: ILEOSTOMY REVERSAL ERAS PATHWAY;  Surgeon: Clovis Riley, MD;  Location: [facility];  Service: General;  Laterality: N/A;   IR RADIOLOGIST EVAL & MGMT  11/19/2017   IR RADIOLOGIST EVAL & MGMT  12/03/2017   IR RADIOLOGIST EVAL & MGMT  12/18/2017   JOINT REPLACEMENT Right 2001   Total knee   LAMINECTOMY     X 3 LUMBAR AND X 2 CERVICAL SPINE OPERATIONS   LAPAROSCOPIC CHOLECYSTECTOMY W/ CHOLANGIOGRAPHY  11/09/04   SURGEON DR. Parkland CATH AND CORONARY ANGIOGRAPHY N/A 09/16/2018   Procedure: LEFT HEART CATH AND CORONARY ANGIOGRAPHY;  Surgeon: Nigel Mormon, MD;  Location: [facility];  Service: Cardiovascular;  Laterality: N/A;   LEFT HEART CATHETERIZATION WITH  CORONARY ANGIOGRAM N/A 10/29/2014   Procedure: LEFT HEART CATHETERIZATION WITH CORONARY ANGIOGRAM;  Surgeon: Laverda Page, MD;  Location[facility];  Service: Cardiovascular;  Laterality: N/A;   LIGATION OF COMPETING BRANCHES OF ARTERIOVENOUS FISTULA Left 07/21/2020   Procedure: LIGATION OF COMPETING BRANCHES OF LEFT UPPER ARM ARTERIOVENOUS FISTULA;  Surgeon: Waynetta Sandy, MD;  Location: [facility];  Service: Vascular;  Laterality: Left;   LOWER EXTREMITY ANGIOGRAM N/A 03/19/2012   Procedure: LOWER EXTREMITY ANGIOGRAM;  Surgeon: Burnell Blanks, MD;  Location[facility];  Service: Cardiovascular;  Laterality: N/A;   LOWER EXTREMITY ANGIOGRAPHY N/A 06/20/2021   Procedure: LOWER EXTREMITY ANGIOGRAPHY;  Surgeon: Nigel Mormon, MD;  Location: [facility];  Service: Cardiovascular;  Laterality: N/A;   NECK SURGERY     PARTIAL COLECTOMY N/A 01/13/2018   Procedure: LAPAROSCOPIC ASSISTED   SIGMOID COLECTOMY ILEOSTOMY;  Surgeon: Clovis Riley, MD;  [facility];  Service: General;  Laterality: N/A;   PENILE PROSTHESIS IMPLANT  08/14/05   INFRAPUBIC INSERTION OF INFLATABLE PENILE PROSTHESIS; SURGEON DR. Amalia Hailey   PENILE PROSTHESIS IMPLANT     PERCUTANEOUS CORONARY STENT INTERVENTION (PCI-S) Right 10/29/2014   Procedure: PERCUTANEOUS CORONARY STENT INTERVENTION (PCI-S);  Surgeon: Laverda Page, MD;  Location[facility];  Service: Cardiovascular;  Laterality: Right;   PERIPHERAL VASCULAR INTERVENTION Left 06/23/2021   Procedure:  PERIPHERAL VASCULAR INTERVENTION;  Surgeon: Nigel Mormon, MD;  Location: [facility];  Service: Cardiovascular;  Laterality: Left;   REMOVAL OF PENILE PROSTHESIS N/A 06/14/2021   Procedure: Removal of THREE piece inflatable penile prosthesis;  Surgeon: Lucas Mallow, MD;  Location: [facility];  Service: Urology;  Laterality: N/A;   SHOULDER ARTHROSCOPY     SPINE SURGERY     TOE AMPUTATION Left    TONSILLECTOMY     TOTAL  KNEE ARTHROPLASTY  07/2002   RIGHT KNEE ; SURGEON  DR. [name] ALSO HAD ARTHROSCOPIC RIGHT KNEE IN  10/2001   TOTAL KNEE ARTHROPLASTY     ULNAR NERVE TRANSPOSITION Right 10/21/2013   Procedure: RIGHT ELBOW  ULNAR NERVE DECOMPRESSION;  Surgeon: Wynonia Sours, MD;  Location: [facility];  Service: Orthopedics;  Laterality: Right;   Social History   Occupational History   Occupation: Magazine features editor: UNEMPLOYED  Tobacco Use   Smoking status: Former    Packs/day: 2.00    Years: 35.00    Pack years: 70.00    Types: Cigarettes    Quit date: 10/28/2011    Years since quitting: 10.0   Smokeless tobacco: Never  Vaping Use   Vaping Use: Some days   Substances: Nicotine  Substance and Sexual Activity   Alcohol use: Not Currently    Comment: "not in a long time"   Drug use: Never   Sexual activity: Yes    Birth control/protection: Implant    Comment: penile implant

## 2021-11-10 ENCOUNTER — Ambulatory Visit (INDEPENDENT_AMBULATORY_CARE_PROVIDER_SITE_OTHER): Payer: 59 | Admitting: Family

## 2021-11-10 ENCOUNTER — Encounter: Payer: Self-pay | Admitting: Family

## 2021-11-10 DIAGNOSIS — I87332 Chronic venous hypertension (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: Secondary | ICD-10-CM | POA: Diagnosis not present

## 2021-11-10 DIAGNOSIS — L97521 Non-pressure chronic ulcer of other part of left foot limited to breakdown of skin: Secondary | ICD-10-CM | POA: Diagnosis not present

## 2021-11-10 DIAGNOSIS — L97929 Non-pressure chronic ulcer of unspecified part of left lower leg with unspecified severity: Secondary | ICD-10-CM

## 2021-11-10 NOTE — Progress Notes (Signed)
Office Visit Note   Patient: [name]           Date of Birth: 11/25/1955           MRN: [id number] Visit Date: 11/10/2021              Requested by: [name], PA-C [address] [place],  [place] [address] PCP: [name], PA-C  Chief Complaint  Patient presents with   Left Foot - Wound Check       HPI: Patient is a 66 year old gentleman who presents in follow-up for ischemic ulcers lateral left foot with venous insufficiency undergoing compression wraps status post endovascular intervention.  Had new skin tear last week over lower leg.   Assessment & Plan: Visit Diagnoses:  No diagnosis found.   Plan:  We will continue with silver cell over the lateral foot wounds with a Dynaflex wrap.  Skin tear has healed.   Follow-Up Instructions: Return in about 1 week (around 11/17/2021).   Ortho Exam  Patient is alert, oriented, no adenopathy, well-dressed, normal affect, normal respiratory effort. Examination of the lateral foot ulcers continue to improve with excellent granulation tissue. These are 4 mm in diameter distally and the more proximal ulcer is 12 mm in diameter. there is no cellulitis no exposed bone or tendon.  Ulcers to the foot are stable in size these are bleeding today after debridement with gauze.    Imaging: No results found. No images are attached to the encounter.  Labs: Lab Results  Component Value Date   HGBA1C 7.9 (H) 06/14/2021   HGBA1C 5.7 (H) 09/16/2018   HGBA1C 7.7 (H) 05/15/2018   ESRSEDRATE 40 (H) 09/03/2021   ESRSEDRATE 17 (H) 06/24/2021   ESRSEDRATE 35 (H) 09/03/2016   CRP 2.7 (H) 09/07/2021   CRP 6.4 (H) 09/06/2021   CRP 8.4 (H) 09/05/2021   REPTSTATUS 09/07/2021 FINAL 09/02/2021   GRAMSTAIN  06/14/2021    NO ORGANISMS SEEN SQUAMOUS EPITHELIAL CELLS PRESENT ABUNDANT WBC PRESENT,BOTH PMN AND MONONUCLEAR MODERATE GRAM POSITIVE COCCI    CULT  09/02/2021    NO GROWTH 5 DAYS Performed at [facility],  [address] [place], [place] [address]    LABORGA ENTEROCOCCUS FAECALIS 06/14/2021     Lab Results  Component Value Date   ALBUMIN 3.0 (L) 09/07/2021   ALBUMIN 3.1 (L) 09/06/2021   ALBUMIN 2.7 (L) 09/05/2021   PREALBUMIN 19.0 05/04/2016   PREALBUMIN 25.0 12/16/2015    Lab Results  Component Value Date   MG 1.9 05/24/2018   MG 2.1 05/23/2018   MG 1.3 (L) 05/22/2018   No results found for: VD25OH  Lab Results  Component Value Date   PREALBUMIN 19.0 05/04/2016   PREALBUMIN 25.0 12/16/2015   CBC EXTENDED Latest Ref Rng & Units 09/07/2021 09/06/2021 09/05/2021  WBC 4.0 - 10.5 K/uL 4.8 2.3(L) 3.8(L)  RBC 4.22 - 5.81 MIL/uL 3.72(L) 3.96(L) 3.35(L)  HGB 13.0 - 17.0 g/dL 11.1(L) 11.9(L) 10.2(L)  HCT 39.0 - 52.0 % 33.0(L) 34.9(L) 30.1(L)  PLT 150 - 400 K/uL 151 117(L) 103(L)  NEUTROABS 1.7 - 7.7 K/uL 3.6 1.9 2.1  LYMPHSABS 0.7 - 4.0 K/uL 0.8 0.3(L) 1.1     There is no height or weight on file to calculate BMI.  Orders:  No orders of the defined types were placed in this encounter.  No orders of the defined types were placed in this encounter.    Procedures: No procedures performed  Clinical Data: No additional  findings.  ROS:  All other systems negative, except as noted in the HPI. Review of Systems  Objective: Vital Signs: There were no vitals taken for this visit.  Specialty Comments:  No specialty comments available.  PMFS History: Patient Active Problem List   Diagnosis Date Noted   Acute metabolic encephalopathy 40/98/1191   COVID 09/02/2021   Sepsis ([place]) 09/02/2021   Severe sepsis with lactic acidosis ([place]) 06/14/2021   Infection associated with implanted penile prosthesis ([place]) 06/14/2021   ESRD on hemodialysis ([place]) 06/14/2021   Paroxysmal atrial fibrillation with RVR ([place]) 06/14/2021   Carotid stenosis 06/15/2020   Dyslipidemia 06/15/2020   Severe nonproliferative diabetic retinopathy of right eye, with macular edema, associated with type 2  diabetes mellitus ([place]) 01/18/2020   Severe nonproliferative diabetic retinopathy of left eye, with macular edema, associated with type 2 diabetes mellitus ([place]) 01/04/2020   Retinal hemorrhage of right eye 01/04/2020   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   Pseudoclaudication 11/15/2018   Essential hypertension 09/18/2018   S/P BKA (below knee amputation), right ([facility]) 09/18/2018   Amputation of toe of left foot ([place]) 09/18/2018   Peripheral artery disease ([place]) 09/18/2018   S/P carotid endarterectomy 09/18/2018   OSA on CPAP 09/18/2018   Chronic back pain 09/18/2018   Status post reversal of ileostomy 05/21/2018   Normocytic anemia 02/14/2018   Intra-abdominal abscess ([place]) 11/04/2017   Chronic venous hypertension (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 ([place]) 12/11/2016   Ischemic ulcer of left foot, limited to breakdown of skin ([place]) 11/12/2016   Unilateral primary osteoarthritis, left knee 10/11/2016   History of right below knee amputation ([place]) 07/12/2016   Acute osteomyelitis of left foot ([place])    Diabetic foot infection ([place]) 05/04/2016   Insulin dependent type 2 diabetes mellitus ([place]) 12/16/2015   Amputated toe ([place]) 10/21/2015   Leg edema, left 09/03/2015   CAD (dz of distal, mid and proximal RCA with implantation of 3 overlapping drug-eluting stent,) 09/03/2015   Chronic diastolic heart failure, NYHA class 2 ([place]) 09/03/2015   Angina pectoris associated with type 2 diabetes mellitus ([place]) 10/28/2014   Hyperlipidemia 08/25/2014   Limb pain 03/20/2013   DJD (degenerative joint disease) 09/25/2012   Migraine 09/25/2012   Neuropathy 09/25/2012   Restless legs syndrome (RLS) 09/25/2012   Chronic obstructive pulmonary disease, unspecified ([place]) 04/25/2012   Unknown cause of morbidity or mortality 04/25/2012   Chronic total occlusion of artery of the extremities ([place]) 04/08/2012   Onychomycosis 02/01/2012   Occlusion and stenosis of carotid artery without  mention of cerebral infarction 06/12/2011   GERD (gastroesophageal reflux disease) 05/08/2011   Barrett's esophagus without dysplasia 05/08/2011   Former tobacco use 02/01/2011   Past Medical History:  Diagnosis Date   Carotid artery occlusion 11/10/10   LEFT CAROTID ENDARTERECTOMY   Chronic kidney disease    Complication of anesthesia    BP WENT UP AT [facility] "   COPD (chronic obstructive pulmonary disease) ([place])    pt denies this dx as of 06/01/20 - no inhaler    Diabetes mellitus without complication ([place])    Diverticulitis    Diverticulosis of colon (without mention of hemorrhage)    DJD (degenerative joint disease)    knees/hands/feet/back/neck   Fatty liver    Full dentures    GERD (gastroesophageal reflux disease)    H/O hiatal hernia    History of blood transfusion    with a past surical procedure per patient 06/01/20   Hyperlipidemia  Hypertension    Neuromuscular disorder ([place])    peripheral neuropathy   Non-pressure chronic ulcer of other part of left foot limited to breakdown of skin ([place]) 11/12/2016   Osteomyelitis ([facility])    left 5th metatarsal   PAD (peripheral artery disease) ([place])    Distal aortogram June 2012. Atherectomy left popliteal artery July 2012.    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 Before age 88    Hyperlipidemia Brother    Hypertension Brother    Hypertension Son    Arthritis Other        GRANDMOTHER   Hypertension Other        OTHER FAMILY MEMBERS    Past Surgical History:  Procedure Laterality Date   ABDOMINAL AORTOGRAM W/LOWER EXTREMITY N/A 06/23/2021   Procedure: ABDOMINAL AORTOGRAM W/LOWER EXTREMITY;  Surgeon: Nigel Mormon, MD;  Location: [facility];  Service: Cardiovascular;  Laterality: N/A;   AMPUTATION  11/05/2011   Procedure: AMPUTATION RAY;  Surgeon: Wylene Simmer, MD;  Location: [facility];  Service: Orthopedics;  Laterality: Right;  Amputation of Right 4&5th Toes   AMPUTATION Left 11/26/2012   Procedure: AMPUTATION RAY;  Surgeon: Wylene Simmer, MD;  Location: [facility];  Service: Orthopedics;  Laterality: Left;  fourth ray amputation   AMPUTATION Right 08/27/2014   Procedure: Transmetatarsal Amputation;  Surgeon: Newt Minion, MD;  Location: [facility];  Service: Orthopedics;  Laterality: Right;   AMPUTATION Right 01/14/2015   Procedure: AMPUTATION BELOW KNEE;  Surgeon: Newt Minion, MD;  Location: [facility];  Service: Orthopedics;  Laterality: Right;   AMPUTATION Left 10/21/2015   Procedure: Left Foot 5th Ray Amputation;  Surgeon: Newt Minion, MD;  Location: [facility];  Service: Orthopedics;  Laterality: Left;   ANTERIOR FUSION CERVICAL SPINE  02/06/06   C4-5, C5-6, C6-7; SURGEON DR. MAX COHEN   AV FISTULA PLACEMENT Left 06/02/2020   Procedure: ARTERIOVENOUS (AV) FISTULA CREATION LEFT;  Surgeon: Waynetta Sandy, MD;  Location: [facility];  Service: Vascular;  Laterality: Left;   BACK SURGERY     x 3   [place] Left 07/21/2020   Procedure: LEFT UPPER ARM ATERIOVENOUS SUPERFISTULALIZATION;  Surgeon: Waynetta Sandy, MD;  Location: [facility];  Service: Vascular;  Laterality: Left;   BELOW KNEE LEG AMPUTATION Right    CARDIAC CATHETERIZATION  10/31/04   2009   CAROTID ENDARTERECTOMY  11/10/10   CAROTID ENDARTERECTOMY Left 11/10/2010   Subtotal occlusion  of left internal  carotid artery with left hemispheric transient ischemic attacks.   CAROTID STENT     CARPAL TUNNEL RELEASE Right 10/21/2013   Procedure: RIGHT CARPAL TUNNEL RELEASE;  Surgeon: Wynonia Sours, MD;  Location: [facility];  Service: Orthopedics;  Laterality: Right;   CHOLECYSTECTOMY     COLON SURGERY     COLONOSCOPY     COLOSTOMY REVERSAL  05/21/2018   ileostomy reversal   CYSTOSCOPY WITH STENT PLACEMENT Bilateral 01/13/2018   Procedure: CYSTOSCOPY WITH BILATERAL URETERAL CATHETER PLACEMENT;  Surgeon: Ardis Hughs, MD;  Location[facility];  Service: Urology;  Laterality: Bilateral;   ESOPHAGEAL MANOMETRY Bilateral 07/19/2014   Procedure: ESOPHAGEAL MANOMETRY (EM);  Surgeon: Jerene Bears, MD;  [facility];  Service: Gastroenterology;  Laterality: Bilateral;   EYE SURGERY Bilateral 2020   cataract   FEMORAL ARTERY STENT     x6   FINGER SURGERY     FOOT SURGERY  04/25/2016    EXCISION BASE 5TH METATARSAL AND PARTIAL CUBOID LEFT FOOT   HERNIA REPAIR     LEFT INGUINAL AND UMBILICAL REPAIRS   HERNIA REPAIR     I & D EXTREMITY Left 04/25/2016   Procedure: EXCISION BASE 5TH METATARSAL AND PARTIAL CUBOID LEFT FOOT;  Surgeon: Newt Minion, MD;  Location: [facility];  Service: Orthopedics;  Laterality: Left;   ILEOSTOMY  01/13/2018   Procedure: ILEOSTOMY;  Surgeon: Clovis Riley, MD;  Location[facility];  Service: General;;   ILEOSTOMY CLOSURE N/A 05/21/2018   Procedure: ILEOSTOMY REVERSAL ERAS PATHWAY;  Surgeon: Clovis Riley, MD;  Location: [facility];  Service: General;  Laterality: N/A;   IR RADIOLOGIST EVAL & MGMT  11/19/2017   IR RADIOLOGIST EVAL & MGMT  12/03/2017   IR RADIOLOGIST EVAL & MGMT  12/18/2017   JOINT REPLACEMENT Right 2001   Total knee   LAMINECTOMY     X 3 LUMBAR AND X 2 CERVICAL SPINE OPERATIONS   LAPAROSCOPIC CHOLECYSTECTOMY W/ CHOLANGIOGRAPHY  11/09/04   SURGEON DR. Honaker CATH AND CORONARY ANGIOGRAPHY N/A 09/16/2018    Procedure: LEFT HEART CATH AND CORONARY ANGIOGRAPHY;  Surgeon: Nigel Mormon, MD;  Location: [facility];  Service: Cardiovascular;  Laterality: N/A;   LEFT HEART CATHETERIZATION WITH CORONARY ANGIOGRAM N/A 10/29/2014   Procedure: LEFT HEART CATHETERIZATION WITH CORONARY ANGIOGRAM;  Surgeon: Laverda Page, MD;  Location: [facility];  Service: Cardiovascular;  Laterality: N/A;   LIGATION OF COMPETING BRANCHES OF ARTERIOVENOUS FISTULA Left 07/21/2020   Procedure: LIGATION OF COMPETING BRANCHES OF LEFT UPPER ARM ARTERIOVENOUS FISTULA;  Surgeon: Waynetta Sandy, MD;  Location: [facility];  Service: Vascular;  Laterality: Left;   LOWER EXTREMITY ANGIOGRAM N/A 03/19/2012   Procedure: LOWER EXTREMITY ANGIOGRAM;  Surgeon: Burnell Blanks, MD;  Location: [facility];  Service: Cardiovascular;  Laterality: N/A;   LOWER EXTREMITY ANGIOGRAPHY N/A 06/20/2021   Procedure: LOWER EXTREMITY ANGIOGRAPHY;  Surgeon: Nigel Mormon, MD;  Location: [facility];  Service: Cardiovascular;  Laterality: N/A;   NECK SURGERY     PARTIAL COLECTOMY N/A 01/13/2018   Procedure: LAPAROSCOPIC ASSISTED   SIGMOID COLECTOMY ILEOSTOMY;  Surgeon: Clovis Riley, MD;  [facility];  Service: General;  Laterality: N/A;   PENILE PROSTHESIS IMPLANT  08/14/05   INFRAPUBIC INSERTION OF INFLATABLE PENILE PROSTHESIS; SURGEON DR. Amalia Hailey   PENILE PROSTHESIS IMPLANT     PERCUTANEOUS CORONARY STENT INTERVENTION (PCI-S) Right 10/29/2014   Procedure:  PERCUTANEOUS CORONARY STENT INTERVENTION (PCI-S);  Surgeon: Laverda Page, MD;  Location[facility];  Service: Cardiovascular;  Laterality: Right;   PERIPHERAL VASCULAR INTERVENTION Left 06/23/2021   Procedure: PERIPHERAL VASCULAR INTERVENTION;  Surgeon: Nigel Mormon, MD;  Location: [facility];  Service: Cardiovascular;  Laterality: Left;   REMOVAL OF PENILE PROSTHESIS N/A 06/14/2021   Procedure: Removal of THREE piece inflatable penile  prosthesis;  Surgeon: Lucas Mallow, MD;  Location: [facility];  Service: Urology;  Laterality: N/A;   SHOULDER ARTHROSCOPY     SPINE SURGERY     TOE AMPUTATION Left    TONSILLECTOMY     TOTAL KNEE ARTHROPLASTY  07/2002   RIGHT KNEE ; SURGEON  DR. [name] ALSO HAD ARTHROSCOPIC RIGHT KNEE IN  10/2001   TOTAL KNEE ARTHROPLASTY     ULNAR NERVE TRANSPOSITION Right 10/21/2013   Procedure: RIGHT ELBOW  ULNAR NERVE DECOMPRESSION;  Surgeon: Wynonia Sours, MD;  Location: [facility];  Service: Orthopedics;  Laterality: Right;   Social History   Occupational History   Occupation: Magazine features editor: UNEMPLOYED  Tobacco Use   Smoking status: Former    Packs/day: 2.00    Years: 35.00    Pack years: 70.00    Types: Cigarettes    Quit date: 10/28/2011    Years since quitting: 10.0   Smokeless tobacco: Never  Vaping Use   Vaping Use: Some days   Substances: Nicotine  Substance and Sexual Activity   Alcohol use: Not Currently    Comment: "not in a long time"   Drug use: Never   Sexual activity: Yes    Birth control/protection: Implant    Comment: penile implant

## 2021-11-15 ENCOUNTER — Other Ambulatory Visit: Payer: Self-pay

## 2021-11-15 ENCOUNTER — Other Ambulatory Visit: Payer: Medicare Other | Admitting: Student

## 2021-11-15 DIAGNOSIS — N186 End stage renal disease: Secondary | ICD-10-CM

## 2021-11-15 DIAGNOSIS — R110 Nausea: Secondary | ICD-10-CM

## 2021-11-15 DIAGNOSIS — Z515 Encounter for palliative care: Secondary | ICD-10-CM

## 2021-11-15 DIAGNOSIS — R52 Pain, unspecified: Secondary | ICD-10-CM

## 2021-11-15 DIAGNOSIS — K5900 Constipation, unspecified: Secondary | ICD-10-CM

## 2021-11-15 DIAGNOSIS — Z992 Dependence on renal dialysis: Secondary | ICD-10-CM

## 2021-11-15 DIAGNOSIS — L97521 Non-pressure chronic ulcer of other part of left foot limited to breakdown of skin: Secondary | ICD-10-CM

## 2021-11-15 NOTE — Progress Notes (Signed)
? ? ?Manufacturing engineer ?Community Palliative Care Consult Note ?Telephone: [phone]  ?Fax: [phone]  ? ? ?Date of encounter: 11/15/21 ?3:40 PM ?PATIENT NAME: [name] ?[place] [address]   ?[phone] (home)  ?DOB: 19-Sep-1955 ?MRN: [id number] ?PRIMARY CARE PROVIDER:    ?[name], Hershal Coria,  ?[address] ?[place] [address] ?[phone] ? ?REFERRING PROVIDER:   ?[name], PA-C ?[address] ?[place],  [place] [address] ?[phone] ? ?RESPONSIBLE PARTY:    ?Contact Information   ? ? Name Relation Home Work Mobile  ? [place] Spouse  [phone]  ? [name],Harold Son   [phone]  ? ?  ? ? ? ?Due to the COVID-19 crisis, this visit was done via telemedicine from my office and it was initiated and consent by this patient and or family. ? ?I connected with  Wink PROXY on 11/15/21 by a video enabled telemedicine application and verified that I am speaking with the correct person using two identifiers. ?  ?I discussed the limitations of evaluation and management by telemedicine. The patient expressed understanding and agreed to proceed.  ? ?                                 ASSESSMENT AND PLAN / RECOMMENDATIONS:  ? ?Advance Care Planning/Goals of Care: Goals include to maximize quality of life and symptom management. Patient/health care surrogate gave his/her permission to discuss. ?Our advance care planning conversation included a discussion about:    ?The value and importance of advance care planning  ?Experiences with loved ones who have been seriously ill or have died  ?Exploration of personal, cultural or spiritual beliefs that might influence medical decisions  ?Exploration of goals of care in the event of a sudden injury or illness  ?CODE STATUS: Full Code ? ?Symptom Management/Plan: ? ?ESRD-continue HD on Tuesday/Thursday/Saturday as directed. reports feeling more tired on HD days; his dry weight was adjusted this week.  He is receiving Midodrine on his HD days.  ? ?Pain-chronic pain; pain managed with current regimen. Continue fentanyl 50 mcg every 72 hours, percocet TID PRN, Lyrica 100 mg BID directed. Constipation-Patient reporting some constipation, start colace 100 mg daily. Patient prefers to take his Linzess PRN.  Palliative Medicine will refill Duragesic and percocet, colace. Scripts sent to [facility] on [address]. ? ?Nausea-continue Zofran 4 mg every 8 hours PRN; discussed increasing to 8 mg if not effective.  ? ?Chronic left foot vascular wound-acute osteomyelitis. Continue wound care compression wraps as directed. Patient current seeing [name] with orthopedic surgery weekly; he follows up on Friday and they will assess for any changes and adjust frequency of his visits.  ? ?Follow up Palliative Care Visit: Palliative care will continue to follow for complex medical decision making, advance care planning, and clarification of goals. Return in 4 weeks or prn. ? ? ?This visit was coded based on medical decision making (MDM). ? ?PPS: 40% ? ?HOSPICE ELIGIBILITY/DIAGNOSIS: TBD ? ?Chief Complaint: Palliative Medicine follow up visit.  ? ?HISTORY OF PRESENT ILLNESS:  [name] is a 66 y.o. year old male  with  ESRD on hemodialysis T-TH-Saturday, chronic diastolic CHF, paroxysmal atrial fibrillation, type 2 diabetes, hypertension, hyperlipidemia, CAD, hx of PCI and stent, severe PAD, right BKA with multiple toes amputated on the left foot, chronic left vascular wound, chronic pain, sinus bradycardia, anemia.  ?  ?Patient reports  a couple of bad days recently;  he has felt more tired on HD days; he is tolerating his full sessions. He states his dry weight was adjusted this week. He endorses good appetite. Weight is 247 pounds. Blood sugars have been between 140/200 mg/dL. He does report occasional nausea and vomiting; taking Zofran 4 mg PRN. Pain is managed well with current regimen. He is sleeping well. Wife  would like additional support with bathing.  ? ? ?History obtained from review of EMR, discussion with primary team, and interview with family, facility staff/caregiver and/or [name].  ?I reviewed available labs, medications, imaging, studies and related documents from the EMR.  Records reviewed and summarized above.  ? ? ?Physical Exam: ?Constitutional: NAD ?General: frail appearing ?EYES: anicteric sclera, lids intact, no discharge  ?ENMT: intact hearing dentition intact ?Pulmonary: no increased work of breathing, no cough, room air ?Abdomen: normo-active BS + 4 quadrants, soft and non tender, no ascites ?GU: deferred ?MSK: moves all extremities, right BKA ?Skin: no rashes or wounds on visible skin ?Neuro:  no generalized weakness,  no cognitive impairment ?Psych: non-anxious affect, A and O x 3 ?Hem/lymph/immuno: no widespread bruising ? ? ?Thank you for the opportunity to participate in the care of [name].  The palliative care team will continue to follow. Please call our office at [phone] if we can be of additional assistance.  ? ?Ezekiel Slocumb, NP  ? ?COVID-19 PATIENT SCREENING TOOL ?Asked and negative response unless otherwise noted:  ? ?Have you had symptoms of covid, tested positive or been in contact with someone with symptoms/positive test in the past 5-10 days? No ? ?

## 2021-11-17 ENCOUNTER — Ambulatory Visit: Payer: 59 | Admitting: Family

## 2021-11-22 ENCOUNTER — Encounter: Payer: Self-pay | Admitting: Family

## 2021-11-22 ENCOUNTER — Other Ambulatory Visit: Payer: Self-pay | Admitting: Family

## 2021-11-22 ENCOUNTER — Telehealth: Payer: Self-pay | Admitting: Family

## 2021-11-22 ENCOUNTER — Ambulatory Visit (INDEPENDENT_AMBULATORY_CARE_PROVIDER_SITE_OTHER): Payer: 59 | Admitting: Family

## 2021-11-22 DIAGNOSIS — Z89511 Acquired absence of right leg below knee: Secondary | ICD-10-CM | POA: Diagnosis not present

## 2021-11-22 DIAGNOSIS — L97929 Non-pressure chronic ulcer of unspecified part of left lower leg with unspecified severity: Secondary | ICD-10-CM

## 2021-11-22 DIAGNOSIS — M86272 Subacute osteomyelitis, left ankle and foot: Secondary | ICD-10-CM | POA: Diagnosis not present

## 2021-11-22 DIAGNOSIS — I87332 Chronic venous hypertension (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: Secondary | ICD-10-CM | POA: Diagnosis not present

## 2021-11-22 MED ORDER — DOXYCYCLINE HYCLATE 100 MG PO TABS: 100.0000 mg | ORAL_TABLET | Freq: Two times a day (BID) | ORAL | 0 refills | Status: DC

## 2021-11-22 NOTE — Progress Notes (Signed)
? ?Office Visit Note ?  ?Patient: [name]           ?Date of Birth: 14-Jan-1956           ?MRN: [id number] ?Visit Date: 11/22/2021 ?             ?Requested by: [name], PA-C ?[address] ?[place],  [place] [id number] ?PCP: [name], PA-C ? ?Chief Complaint  ?Patient presents with  ? Left Foot - Wound Check  ? ? ? ? ? ?HPI: ?Patient is a 66 year old gentleman who presents in follow-up for ischemic ulcers lateral left foot with venous insufficiency undergoing compression wraps status post endovascular intervention. ? ?Unfortunately he missed his appointment last Friday and did have his Dynaflex compression wrap on for about 10 days.  He is concerned for some worsening of the wound. ? ?Chart review reveals MRI of the left foot from December of last year which showed: ?IMPRESSION: ?1. Wound/ulceration at the lateral aspect of the foot adjacent to ?the fourth TMT joint. ?2. Findings of acute osteomyelitis involving the cuboid and adjacent ?fourth metatarsal base. ?3. Additional findings suggest early acute osteomyelitis involving ?the peripheral aspect of the lateral cuneiform and third metatarsal ?base. ?4. Multiple foci of susceptibility within the soft tissues along the ?lateral margin of the foot compatible with soft tissue air, which ?may be secondary to open wound or infection with gas-forming ?organism. ? ?Assessment & Plan: ?Visit Diagnoses:  ?1. Subacute osteomyelitis, left ankle and foot ([place])   ?2. Chronic venous hypertension (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 (HCC)   ?3. History of right below knee amputation ([place])   ? ? ?Plan:  We will continue with silver cell over the lateral foot wounds with a Dynaflex wrap.  Discussed osteomyelitis with the patient.  We will place him on doxycycline.  The patient is resistant to surgical intervention at this time. ? ?Follow-Up Instructions: No follow-ups on file.  ? ?Ortho Exam ? ?Patient is alert, oriented, no  adenopathy, well-dressed, normal affect, normal respiratory effort.  On examination of the left foot he has good control of the edema under the area that had a compression wrap with wrinkling of the skin laterally he has stable ulcerations these are unchanged in size there is no probing no exposed bone or tendon no surrounding erythema ? ? ?Imaging: ?No results found. ?No images are attached to the encounter. ? ?Labs: ?Lab Results  ?Component Value Date  ? HGBA1C 7.9 (H) 06/14/2021  ? HGBA1C 5.7 (H) 09/16/2018  ? HGBA1C 7.7 (H) 05/15/2018  ? ESRSEDRATE 40 (H) 09/03/2021  ? ESRSEDRATE 17 (H) 06/24/2021  ? ESRSEDRATE 35 (H) 09/03/2016  ? CRP 2.7 (H) 09/07/2021  ? CRP 6.4 (H) 09/06/2021  ? CRP 8.4 (H) 09/05/2021  ? REPTSTATUS 09/07/2021 FINAL 09/02/2021  ? GRAMSTAIN  06/14/2021  ?  NO ORGANISMS SEEN SQUAMOUS EPITHELIAL CELLS PRESENT ?ABUNDANT WBC PRESENT,BOTH PMN AND MONONUCLEAR ?MODERATE GRAM POSITIVE COCCI ?  ? CULT  09/02/2021  ?  NO GROWTH 5 DAYS ?Performed at [facility], [place] [address] [place], [place] [address] ?  ? LABORGA ENTEROCOCCUS FAECALIS 06/14/2021  ? ? ? ?Lab Results  ?Component Value Date  ? ALBUMIN 3.0 (L) 09/07/2021  ? ALBUMIN 3.1 (L) 09/06/2021  ? ALBUMIN 2.7 (L) 09/05/2021  ? PREALBUMIN 19.0 05/04/2016  ? PREALBUMIN 25.0 12/16/2015  ? ? ?Lab Results  ?Component Value Date  ? MG 1.9 05/24/2018  ? MG 2.1 05/23/2018  ?  MG 1.3 (L) 05/22/2018  ? ?No results found for: VD25OH ? ?Lab Results  ?Component Value Date  ? PREALBUMIN 19.0 05/04/2016  ? PREALBUMIN 25.0 12/16/2015  ? ?CBC EXTENDED Latest Ref Rng & Units 09/07/2021 09/06/2021 09/05/2021  ?WBC 4.0 - 10.5 K/uL 4.8 2.3(L) 3.8(L)  ?RBC 4.22 - 5.81 MIL/uL 3.72(L) 3.96(L) 3.35(L)  ?HGB 13.0 - 17.0 g/dL 11.1(L) 11.9(L) 10.2(L)  ?HCT 39.0 - 52.0 % 33.0(L) 34.9(L) 30.1(L)  ?PLT 150 - 400 K/uL 151 117(L) 103(L)  ?NEUTROABS 1.7 - 7.7 K/uL 3.6 1.9 2.1  ?LYMPHSABS 0.7 - 4.0 K/uL 0.8 0.3(L) 1.1  ? ? ? ?There is no height or weight on file to calculate  BMI. ? ?Orders:  ?No orders of the defined types were placed in this encounter. ? ?No orders of the defined types were placed in this encounter. ? ? ? Procedures: ?No procedures performed ? ?Clinical Data: ?No additional findings. ? ?ROS: ? ?All other systems negative, except as noted in the HPI. ?Review of Systems  ?Constitutional:  Negative for chills and fever.  ? ?Objective: ?Vital Signs: There were no vitals taken for this visit. ? ?Specialty Comments:  ?No specialty comments available. ? ?PMFS History: ?Patient Active Problem List  ? Diagnosis Date Noted  ? Acute metabolic encephalopathy 16/06/9603  ? COVID 09/02/2021  ? Sepsis ([place]) 09/02/2021  ? Severe sepsis with lactic acidosis ([place]) 06/14/2021  ? Infection associated with implanted penile prosthesis ([place]) 06/14/2021  ? ESRD on hemodialysis ([place]) 06/14/2021  ? Paroxysmal atrial fibrillation with RVR ([place]) 06/14/2021  ? Carotid stenosis 06/15/2020  ? Dyslipidemia 06/15/2020  ? Severe nonproliferative diabetic retinopathy of right eye, with macular edema, associated with type 2 diabetes mellitus ([place]) 01/18/2020  ? Severe nonproliferative diabetic retinopathy of left eye, with macular edema, associated with type 2 diabetes mellitus ([place]) 01/04/2020  ? Retinal hemorrhage of right eye 01/04/2020  ? 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 Pseudoclaudication 11/15/2018  ? Essential hypertension 09/18/2018  ? S/P BKA (below knee amputation), right ([place]) 09/18/2018  ? Amputation of toe of left foot ([place]) 09/18/2018  ? Peripheral artery disease ([place]) 09/18/2018  ? S/P carotid endarterectomy 09/18/2018  ? OSA on CPAP 09/18/2018  ? Chronic back pain 09/18/2018  ? Status post reversal of ileostomy 05/21/2018  ? Normocytic anemia 02/14/2018  ? Intra-abdominal abscess ([place]) 11/04/2017  ? Chronic venous hypertension (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 ([place]) 12/11/2016  ? Ischemic ulcer of left foot, limited to breakdown of skin ([place]) 11/12/2016  ?  Unilateral primary osteoarthritis, left knee 10/11/2016  ? History of right below knee amputation ([place]) 07/12/2016  ? Acute osteomyelitis of left foot ([place])   ? Diabetic foot infection ([place]) 05/04/2016  ? Insulin dependent type 2 diabetes mellitus ([place]) 12/16/2015  ? Amputated toe ([place]) 10/21/2015  ? Leg edema, left 09/03/2015  ? CAD (dz of distal, mid and proximal RCA with implantation of 3 overlapping drug-eluting stent,) 09/03/2015  ? Chronic diastolic heart failure, NYHA class 2 ([place]) 09/03/2015  ? Angina pectoris associated with type 2 diabetes mellitus ([place]) 10/28/2014  ? Hyperlipidemia 08/25/2014  ? Limb pain 03/20/2013  ? DJD (degenerative joint disease) 09/25/2012  ? Migraine 09/25/2012  ? Neuropathy 09/25/2012  ? Restless legs syndrome (RLS) 09/25/2012  ? Chronic obstructive pulmonary disease, unspecified ([place]) 04/25/2012  ? Unknown cause of morbidity or mortality 04/25/2012  ? Chronic total occlusion of artery of the extremities ([place]) 04/08/2012  ? Onychomycosis 02/01/2012  ? Occlusion and stenosis of carotid  artery without mention of cerebral infarction 06/12/2011  ? GERD (gastroesophageal reflux disease) 05/08/2011  ? Barrett's esophagus without dysplasia 05/08/2011  ? Former tobacco use 02/01/2011  ? ?Past Medical History:  ?Diagnosis Date  ? Carotid artery occlusion 11/10/10  ? LEFT CAROTID ENDARTERECTOMY  ? Chronic kidney disease   ? Complication of anesthesia   ? BP WENT UP AT [facility] "  ? COPD (chronic obstructive pulmonary disease) ([place])   ? pt denies this dx as of 06/01/20 - no inhaler   ? Diabetes mellitus without complication ([place])   ? Diverticulitis   ? Diverticulosis of colon (without mention of hemorrhage)   ? DJD (degenerative joint disease)   ? knees/hands/feet/back/neck  ? Fatty liver   ? Full dentures   ? GERD (gastroesophageal reflux disease)   ? H/O hiatal hernia   ? History of blood transfusion   ? with a past surical procedure per patient 06/01/20  ? Hyperlipidemia   ? Hypertension   ?  Neuromuscular disorder ([place])   ? peripheral neuropathy  ? Non-pressure chronic ulcer of other part of left foot limited to breakdown of skin ([place]) 11/12/2016  ? Osteomyelitis ([place])   ? left 5th metatarsal  ? PAD (per

## 2021-11-22 NOTE — Telephone Encounter (Signed)
Pt was seen in office today and needs to be set up for home health nursing to do LLE dynaflex wraps twice weekly. Referral sent to [facility]. ?

## 2021-11-27 ENCOUNTER — Other Ambulatory Visit: Payer: Self-pay

## 2021-11-27 ENCOUNTER — Ambulatory Visit (INDEPENDENT_AMBULATORY_CARE_PROVIDER_SITE_OTHER): Payer: 59 | Admitting: Ophthalmology

## 2021-11-27 ENCOUNTER — Encounter (INDEPENDENT_AMBULATORY_CARE_PROVIDER_SITE_OTHER): Payer: Self-pay | Admitting: Ophthalmology

## 2021-11-27 ENCOUNTER — Ambulatory Visit (INDEPENDENT_AMBULATORY_CARE_PROVIDER_SITE_OTHER): Payer: 59 | Admitting: Orthopedic Surgery

## 2021-11-27 DIAGNOSIS — E113511 Type 2 diabetes mellitus with proliferative diabetic retinopathy with macular edema, right eye: Secondary | ICD-10-CM

## 2021-11-27 DIAGNOSIS — L97929 Non-pressure chronic ulcer of unspecified part of left lower leg with unspecified severity: Secondary | ICD-10-CM | POA: Diagnosis not present

## 2021-11-27 DIAGNOSIS — E113512 Type 2 diabetes mellitus with proliferative diabetic retinopathy with macular edema, left eye: Secondary | ICD-10-CM | POA: Insufficient documentation

## 2021-11-27 DIAGNOSIS — E113591 Type 2 diabetes mellitus with proliferative diabetic retinopathy without macular edema, right eye: Secondary | ICD-10-CM

## 2021-11-27 DIAGNOSIS — E113411 Type 2 diabetes mellitus with severe nonproliferative diabetic retinopathy with macular edema, right eye: Secondary | ICD-10-CM | POA: Diagnosis not present

## 2021-11-27 DIAGNOSIS — H4311 Vitreous hemorrhage, right eye: Secondary | ICD-10-CM

## 2021-11-27 DIAGNOSIS — I87332 Chronic venous hypertension (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: Secondary | ICD-10-CM | POA: Diagnosis not present

## 2021-11-27 MED ORDER — AFLIBERCEPT 2MG/0.05ML IZ SOLN FOR KALEIDOSCOPE
2.0000 mg | INTRAVITREAL | Status: AC | PRN
  Administered 2021-11-27: 2 mg via INTRAVITREAL

## 2021-11-27 NOTE — Assessment & Plan Note (Signed)
Dense vitreous hemorrhage accounts for acuity, likely from PDR progression due to lack of therapy available due to other illnesses for his previous severe NPDR ?

## 2021-11-27 NOTE — Assessment & Plan Note (Signed)
We will control initially with intravitreal injection antivegF, Eylea for his underlying CSME as well as PDR ?

## 2021-11-27 NOTE — Progress Notes (Signed)
? ? ?11/27/2021 ? ?  ? ?CHIEF COMPLAINT ?Patient presents for  ?Chief Complaint  ?Patient presents with  ? Diabetic Retinopathy with Macular Edema  ? ? ? ? ?HISTORY OF PRESENT ILLNESS: ?[name] is a 66 y.o. male who presents to the clinic today for:  ? ?HPI   ?Missed 7 week fu from October, OD OCT EYLEA OD--Vision decreased since last appnt from august 2022. ?Pt has been in hospital and had to reschedule twice. ?Pt states, " I got black wiggly things coming across my eyes. I have to close my right eye to be able to see better. If I put my hand in front of my right eye then ill see a lot of black squiggly lines." ?Pt sees floaters and described FOL as bright gold and green lights in the corner of eye. ?Pt denies pain but watery eyes. ?Pt is on dialysis and doctor ordered mediations for pts blood pressure  ? ? ? ?Last edited by Silvestre Moment on 11/27/2021  2:27 PM.  ?  ? ? ?Referring physician: ?[name], PA-C ?[place] ?[place],  [place] [id number] ? ?HISTORICAL INFORMATION:  ? ?Selected notes from the [facility] ?  ? ?Lab Results  ?Component Value Date  ? HGBA1C 7.9 (H) 06/14/2021  ?  ? ?CURRENT MEDICATIONS: ?Current Outpatient Medications (Ophthalmic Drugs)  ?Medication Sig  ? Polyvinyl Alcohol-Povidone PF 1.4-0.6 % SOLN Place 1 drop into both eyes 2 (two) times daily as needed (dry eyes).   ? ?No current facility-administered medications for this visit. (Ophthalmic Drugs)  ? ?Current Outpatient Medications (Other)  ?Medication Sig  ? amiodarone (PACERONE) 200 MG tablet Take 1 tablet (200 mg total) by mouth daily. Two (2) tablets for 10 days then one tab daily  ? amLODipine (NORVASC) 5 MG tablet Take 5 mg by mouth daily.  ? apixaban (ELIQUIS) 5 MG TABS tablet Take 1 tablet (5 mg total) by mouth 2 (two) times daily.  ? atorvastatin (LIPITOR) 40 MG tablet Take 40 mg by mouth at bedtime.   ? B Complex-C-Zn-Folic Acid (DIALYVITE/ZINC) TABS Take 1 tablet by mouth daily.  ? doxycycline (VIBRA-TABS) 100  MG tablet TAKE 1 TABLET BY MOUTH TWICE A DAY  ? famotidine (PEPCID) 20 MG tablet Take 1 tablet (20 mg total) by mouth daily.  ? fentaNYL (DURAGESIC) 50 MCG/HR Place 1 patch onto the skin every 3 (three) days.  ? folic acid (FOLVITE) 1 MG tablet Take 1 mg by mouth daily.  ? folic acid-vitamin b complex-vitamin c-selenium-zinc (DIALYVITE) 3 MG TABS tablet Take 1 tablet by mouth daily.  ? Glucosamine HCl (GLUCOSAMINE PO) Take 1 tablet by mouth 2 (two) times daily.  ? HUMALOG KWIKPEN 100 UNIT/ML KwikPen Inject 4 Units into the skin 3 (three) times daily.  ? insulin glargine (LANTUS) 100 UNIT/ML injection Inject 0.1 mLs (10 Units total) into the skin at bedtime. (Patient taking differently: Inject 20 Units into the skin at bedtime.)  ? ketoconazole (NIZORAL) 2 % cream Apply 1 application topically 2 (two) times daily as needed for irritation.   ? lidocaine-prilocaine (EMLA) cream Apply 1 application topically See admin instructions. Prior to Dialysis days Tuesday,Thursday and saturday  ? linaclotide (LINZESS) 145 MCG CAPS capsule Take 145 mcg by mouth daily as needed (constipation).   ? loratadine (CLARITIN) 10 MG tablet Take 10 mg by mouth daily.  ? midodrine (PROAMATINE) 10 MG tablet Take 1 tablet (10 mg total) by mouth every other day. One hour before dialysis  ?  NEEDLE, REUSABLE, 22 G 22G X 1-1/2" MISC 1 Units by Does not apply route as directed. For B12 IM inj  ? nitroGLYCERIN (NITRODUR - DOSED IN MG/24 HR) 0.2 mg/hr patch Place 1 patch (0.2 mg total) onto the skin daily.  ? omega-3 acid ethyl esters (LOVAZA) 1 g capsule Take 2 g by mouth 2 (two) times daily.  ? ondansetron (ZOFRAN) 4 MG tablet Take 4 mg by mouth every 6 (six) hours as needed for nausea or vomiting.  ? oxyCODONE-acetaminophen (PERCOCET) 7.5-325 MG tablet Take 1 tablet by mouth 3 (three) times daily as needed for moderate pain.  ? pantoprazole (PROTONIX) 40 MG tablet Take 40 mg by mouth daily.  ? pregabalin (LYRICA) 100 MG capsule Take 100 mg by  mouth 2 (two) times daily.  ? rOPINIRole (REQUIP) 2 MG tablet Take 2 mg by mouth at bedtime.  ? Syringe/Needle, Disp, (SYRINGE 3CC/22GX1-1/2") 22G X 1-1/2" 3 ML MISC 1 Syringe by Does not apply route as directed. For b12 IM inj  ? tamsulosin (FLOMAX) 0.4 MG CAPS capsule Take 0.4 mg by mouth daily.  ? ?No current facility-administered medications for this visit. (Other)  ? ? ? ? ?REVIEW OF SYSTEMS: ?ROS   ?Positive for: Cardiovascular ?Negative for: Constitutional, Gastrointestinal, Neurological, Skin, Genitourinary, Musculoskeletal, HENT, Endocrine, Eyes, Respiratory, Psychiatric, Allergic/Imm, Heme/Lymph ?Last edited by Silvestre Moment on 11/27/2021  2:26 PM.  ?  ? ? ? ?ALLERGIES ?Allergies  ?Allergen Reactions  ? Contrast Media [Iodinated Contrast Media] Shortness Of Breath and Other (See Comments)  ?  Difficulty breathing and altered mental status ? ?  ? Ivp Dye [Iodinated Contrast Media] Anaphylaxis, Shortness Of Breath and Other (See Comments)  ?  Breathing problems, altered mental state ?  ? Adhesive [Tape] Rash and Other (See Comments)  ?  Rash after 1 day of use  ? Latex Rash and Other (See Comments)  ?  A severe rash appears after the first 24 hours of being placed  ? ? ?PAST MEDICAL HISTORY ?Past Medical History:  ?Diagnosis Date  ? Carotid artery occlusion 11/10/10  ? LEFT CAROTID ENDARTERECTOMY  ? Chronic kidney disease   ? Complication of anesthesia   ? BP WENT UP AT [facility] "  ? COPD (chronic obstructive pulmonary disease) ([place])   ? pt denies this dx as of 06/01/20 - no inhaler   ? Diabetes mellitus without complication ([place])   ? Diverticulitis   ? Diverticulosis of colon (without mention of hemorrhage)   ? DJD (degenerative joint disease)   ? knees/hands/feet/back/neck  ? Fatty liver   ? Full dentures   ? GERD (gastroesophageal reflux disease)   ? H/O hiatal hernia   ? History of blood transfusion   ? with a past surical procedure per patient 06/01/20  ? Hyperlipidemia   ? Hypertension   ? Neuromuscular disorder  ([place])   ? peripheral neuropathy  ? Non-pressure chronic ulcer of other part of left foot limited to breakdown of skin ([place]) 11/12/2016  ? Osteomyelitis ([place])   ? left 5th metatarsal  ? PAD (peripheral artery disease) ([place])   ? Distal aortogram June 2012. Atherectomy left popliteal artery July 2012.   ? Pseudoclaudication 11/15/2018  ? Sleep apnea   ? pt denies this dx as of 06/01/20  ? Slurred speech   ? AS PER WIFE IN D/C NOTE 11/10/10  ? 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 Unstable angina ([place]) 09/16/2018  ? Wears glasses   ? ?Past Surgical History:  ?Procedure Laterality Date  ?  ABDOMINAL AORTOGRAM W/LOWER EXTREMITY N/A 06/23/2021  ? Procedure: ABDOMINAL AORTOGRAM W/LOWER EXTREMITY;  Surgeon: Nigel Mormon, MD;  Location: [facility];  Service: Cardiovascular;  Laterality: N/A;  ? AMPUTATION  11/05/2011  ? Procedure: AMPUTATION RAY;  Surgeon: Wylene Simmer, MD;  Location: [facility];  Service: Orthopedics;  Laterality: Right;  Amputation of Right 4&5th Toes  ? AMPUTATION Left 11/26/2012  ? Procedure: AMPUTATION RAY;  Surgeon: Wylene Simmer, MD;  Location: [facility];  Service: Orthopedics;  Laterality: Left;  fourth ray amputation  ? AMPUTATION Right 08/27/2014  ? Procedure: Transmetatarsal Amputation;  Surgeon: Newt Minion, MD;  Location: [facility];  Service: Orthopedics;  Laterality: Right;  ? AMPUTATION Right 01/14/2015  ? Procedure: AMPUTATION BELOW KNEE;  Surgeon: Newt Minion, MD;  Location: [facility];  Service: Orthopedics;  Laterality: Right;  ? AMPUTATION Left 10/21/2015  ? Procedure: Left Foot 5th Ray Amputation;  Surgeon: Newt Minion, MD;  Location: [facility];  Service: Orthopedics;  Laterality: Left;  ? ANTERIOR FUSION CERVICAL SPINE  02/06/06  ? C4-5, C5-6, C6-7; SURGEON DR. MAX COHEN  ? AV FISTULA PLACEMENT Left 06/02/2020  ? Procedure: ARTERIOVENOUS (AV) FISTULA CREATION LEFT;  Surgeon: Waynetta Sandy, MD;  Location: [facility];  Service: Vascular;  Laterality: Left;  ? BACK SURGERY    ? x 3  ? BASCILIC VEIN  TRANSPOSITION Left 07/21/2020  ? Procedure: LEFT UPPER ARM ATERIOVENOUS SUPERFISTULALIZATION;  Surgeon: Waynetta Sandy, MD;  Location: [facility];  Service: Vascular;  Laterality: Left;  ? BELOW KNEE LEG AMPUTATI

## 2021-11-27 NOTE — Assessment & Plan Note (Signed)
>>  ASSESSMENT AND PLAN FOR VITREOUS HEMORRHAGE OF RIGHT EYE (HCC) WRITTEN ON 11/27/2021  3:05 PM BY ELNER KUBA A, MD  Dense vitreous hemorrhage accounts for acuity, likely from PDR progression due to lack of therapy available due to other illnesses for his previous severe NPDR

## 2021-12-01 ENCOUNTER — Ambulatory Visit: Payer: 59 | Admitting: Family

## 2021-12-03 ENCOUNTER — Encounter: Payer: Self-pay | Admitting: Orthopedic Surgery

## 2021-12-03 NOTE — Progress Notes (Signed)
? ?Office Visit Note ?  ?Patient: [name]           ?Date of Birth: Aug 18, 1956           ?MRN: [id number] ?Visit Date: 11/27/2021 ?             ?Requested by: [name], PA-C ?[address] ?[place],  [place] [address] ?PCP: [name], PA-C ? ?No chief complaint on file. ? ? ? ? ?HPI: ?Patient is a 66 year old gentleman who is seen in follow-up for ischemic ulcer lateral left foot. ? ?Assessment & Plan: ?Visit Diagnoses:  ?1. Chronic venous hypertension (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 ([place])   ? ? ?Plan: Ulcer is healing quite well we will place an order for referral for home health dressing changes twice a week. ? ?Follow-Up Instructions: Return in about 1 week (around 12/04/2021).  ? ?Ortho Exam ? ?Patient is alert, oriented, no adenopathy, well-dressed, normal affect, normal respiratory effort. ?Examination the ulcer is almost completely healed there is no cellulitis no drainage no signs of infection.  Dynaflex and silver cell is applied. ? ?Imaging: ?No results found. ?No images are attached to the encounter. ? ?Labs: ?Lab Results  ?Component Value Date  ? HGBA1C 7.9 (H) 06/14/2021  ? HGBA1C 5.7 (H) 09/16/2018  ? HGBA1C 7.7 (H) 05/15/2018  ? ESRSEDRATE 40 (H) 09/03/2021  ? ESRSEDRATE 17 (H) 06/24/2021  ? ESRSEDRATE 35 (H) 09/03/2016  ? CRP 2.7 (H) 09/07/2021  ? CRP 6.4 (H) 09/06/2021  ? CRP 8.4 (H) 09/05/2021  ? REPTSTATUS 09/07/2021 FINAL 09/02/2021  ? GRAMSTAIN  06/14/2021  ?  NO ORGANISMS SEEN SQUAMOUS EPITHELIAL CELLS PRESENT ?ABUNDANT WBC PRESENT,BOTH PMN AND MONONUCLEAR ?MODERATE GRAM POSITIVE COCCI ?  ? CULT  09/02/2021  ?  NO GROWTH 5 DAYS ?Performed at [facility], [place] [address], [place], [place] [address] ?  ? LABORGA ENTEROCOCCUS FAECALIS 06/14/2021  ? ? ? ?Lab Results  ?Component Value Date  ? ALBUMIN 3.0 (L) 09/07/2021  ? ALBUMIN 3.1 (L) 09/06/2021  ? ALBUMIN 2.7 (L) 09/05/2021  ? PREALBUMIN 19.0 05/04/2016  ? PREALBUMIN 25.0 12/16/2015  ? ? ?Lab  Results  ?Component Value Date  ? MG 1.9 05/24/2018  ? MG 2.1 05/23/2018  ? MG 1.3 (L) 05/22/2018  ? ?No results found for: VD25OH ? ?Lab Results  ?Component Value Date  ? PREALBUMIN 19.0 05/04/2016  ? PREALBUMIN 25.0 12/16/2015  ? ? ?  Latest Ref Rng & Units 09/07/2021  ?  2:38 AM 09/06/2021  ?  1:26 AM 09/05/2021  ?  1:51 AM  ?CBC EXTENDED  ?WBC 4.0 - 10.5 K/uL 4.8   2.3   3.8    ?RBC 4.22 - 5.81 MIL/uL 3.72   3.96   3.35    ?Hemoglobin 13.0 - 17.0 g/dL 11.1   11.9   10.2    ?HCT 39.0 - 52.0 % 33.0   34.9   30.1    ?Platelets 150 - 400 K/uL 151   117   103    ?NEUT# 1.7 - 7.7 K/uL 3.6   1.9   2.1    ?Lymph# 0.7 - 4.0 K/uL 0.8   0.3   1.1    ? ? ? ?There is no height or weight on file to calculate BMI. ? ?Orders:  ?No orders of the defined types were placed in this encounter. ? ?No orders of the defined types were placed in this encounter. ? ? ? Procedures: ?  No procedures performed ? ?Clinical Data: ?No additional findings. ? ?ROS: ? ?All other systems negative, except as noted in the HPI. ?Review of Systems ? ?Objective: ?Vital Signs: There were no vitals taken for this visit. ? ?Specialty Comments:  ?No specialty comments available. ? ?PMFS History: ?Patient Active Problem List  ? Diagnosis Date Noted  ? 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 Vitreous hemorrhage of right eye ([place]) 11/27/2021  ? Acute metabolic encephalopathy 31/49/7026  ? COVID 09/02/2021  ? Sepsis ([place]) 09/02/2021  ? Severe sepsis with lactic acidosis ([place]) 06/14/2021  ? Infection associated with implanted penile prosthesis ([place]) 06/14/2021  ? ESRD on hemodialysis ([place]) 06/14/2021  ? Paroxysmal atrial fibrillation with RVR ([place]) 06/14/2021  ? Carotid stenosis 06/15/2020  ? Dyslipidemia 06/15/2020  ? Severe nonproliferative diabetic retinopathy of right eye, with macular edema, associated with type 2 diabetes mellitus ([place]) 01/18/2020  ? Severe  nonproliferative diabetic retinopathy of left eye, with macular edema, associated with type 2 diabetes mellitus ([place]) 01/04/2020  ? Retinal hemorrhage of right eye 01/04/2020  ? 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 Pseudoclaudication 11/15/2018  ? Essential hypertension 09/18/2018  ? S/P BKA (below knee amputation), right ([place]) 09/18/2018  ? Amputation of toe of left foot ([place]) 09/18/2018  ? Peripheral artery disease ([place]) 09/18/2018  ? S/P carotid endarterectomy 09/18/2018  ? OSA on CPAP 09/18/2018  ? Chronic back pain 09/18/2018  ? Status post reversal of ileostomy 05/21/2018  ? Normocytic anemia 02/14/2018  ? Intra-abdominal abscess ([place]) 11/04/2017  ? Chronic venous hypertension (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 ([place]) 12/11/2016  ? Ischemic ulcer of left foot, limited to breakdown of skin ([place]) 11/12/2016  ? Unilateral primary osteoarthritis, left knee 10/11/2016  ? History of right below knee amputation ([place]) 07/12/2016  ? Acute osteomyelitis of left foot ([place])   ? Diabetic foot infection ([place]) 05/04/2016  ? Insulin dependent type 2 diabetes mellitus ([place]) 12/16/2015  ? Amputated toe ([place]) 10/21/2015  ? Leg edema, left 09/03/2015  ? CAD (dz of distal, mid and proximal RCA with implantation of 3 overlapping drug-eluting stent,) 09/03/2015  ? Chronic diastolic heart failure, NYHA class 2 ([place]) 09/03/2015  ? Angina pectoris associated with type 2 diabetes mellitus ([place]) 10/28/2014  ? Hyperlipidemia 08/25/2014  ? Limb pain 03/20/2013  ? DJD (degenerative joint disease) 09/25/2012  ? Migraine 09/25/2012  ? Neuropathy 09/25/2012  ? Restless legs syndrome (RLS) 09/25/2012  ? Chronic obstructive pulmonary disease, unspecified ([place]) 04/25/2012  ? Unknown cause of morbidity or mortality 04/25/2012  ? Chronic total occlusion of artery of the extremities ([place]) 04/08/2012  ? Onychomycosis 02/01/2012  ? Occlusion and stenosis of carotid artery without mention of cerebral infarction 06/12/2011  ?  GERD (gastroesophageal reflux disease) 05/08/2011  ? Barrett's esophagus without dysplasia 05/08/2011  ? Former tobacco use 02/01/2011  ? ?Past Medical History:  ?Diagnosis Date  ? Carotid artery occlusion 11/10/10  ? LEFT CAROTID ENDARTERECTOMY  ? Chronic kidney disease   ? Complication of anesthesia   ? BP WENT UP AT [facility] "  ? COPD (chronic obstructive pulmonary disease) ([place])   ? pt denies this dx as of 06/01/20 - no inhaler   ? Diabetes mellitus without complication ([place])   ? Diverticulitis   ? Diverticulosis of colon (without mention of hemorrhage)   ? DJD (degenerative joint disease)   ? knees/hands/feet/back/neck  ? Fatty liver   ? Full dentures   ?  GERD (gastroesophageal reflux disease)   ? H/O hiatal hernia   ? History of blood transfusion   ? with a past surical procedure per patient 06/01/20  ? Hyperlipidemia   ? Hypertension   ? Neuromuscular disorder ([place])   ? peripheral neuropathy  ? Non-pressure chronic ulcer of other part of left foot limited to breakdown of skin ([place]) 11/12/2016  ? Osteomyelitis ([place])   ? left 5th metatarsal  ? PAD (peripheral artery disease) ([place])   ? Distal aortogram June 2012. Atherectomy left popliteal artery July 2012.   ? Pseudoclaudication 11/15/2018  ? Sleep apnea   ? pt denies this dx as of 06/01/20  ? Slurred speech   ? AS PER WIFE IN D/C NOTE 11/10/10  ? 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 Unstable angina ([place]) 09/16/2018  ? Wears glasses   ?  ?Family History  ?Problem Relation Age of Onset  ? Heart disease Father   ?     Before age 55-  CAD, BPG  ? Diabetes Father   ?     Amputation  ? Cancer Father   ?     PROSTATE  ? Hyperlipidemia Father   ? Hypertension Father   ? Heart attack Father   ?     Triple BPG  ? Varicose Veins Father   ? Cancer Sister   ?     Breast  ? Hyperlipidemia Sister   ? Hypertension Sister   ? Heart attack Brother   ? Colon cancer Brother   ? Diabetes Brother   ? Heart disease Brother 46  ?     A-Fib. Before age 51  ? Hyperlipidemia Brother   ? Hypertension Brother    ? Hypertension Son   ? Arthritis Other   ?     GRANDMOTHER  ? Hypertension Other   ?     OTHER FAMILY MEMBERS  ?  ?Past Surgical History:  ?Procedure Laterality Date  ? ABDOMINAL AORTOGRAM W/LOWER EXTREMITY N/A 10/14

## 2021-12-04 ENCOUNTER — Ambulatory Visit (INDEPENDENT_AMBULATORY_CARE_PROVIDER_SITE_OTHER): Payer: 59 | Admitting: Orthopedic Surgery

## 2021-12-04 DIAGNOSIS — R600 Localized edema: Secondary | ICD-10-CM

## 2021-12-04 DIAGNOSIS — L97929 Non-pressure chronic ulcer of unspecified part of left lower leg with unspecified severity: Secondary | ICD-10-CM | POA: Diagnosis not present

## 2021-12-04 DIAGNOSIS — I87332 Chronic venous hypertension (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: Secondary | ICD-10-CM

## 2021-12-06 ENCOUNTER — Encounter: Payer: Self-pay | Admitting: Orthopedic Surgery

## 2021-12-06 NOTE — Progress Notes (Signed)
? ?Office Visit Note ?  ?Patient: [name]           ?Date of Birth: November 06, 1955           ?MRN: [id number] ?Visit Date: 12/04/2021 ?             ?Requested by: [name], PA-C ?[address] ?[place],  [place] [address] ?PCP: [name], PA-C ? ?Chief Complaint  ?Patient presents with  ? Left Foot - Wound Check  ? ? ? ? ?HPI: ?Patient is a 66 year old gentleman who presents in follow-up for a ischemic ulcer base of the fifth metatarsal left foot. ? ?Assessment & Plan: ?Visit Diagnoses:  ?1. Chronic venous hypertension (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 ([place])   ? ? ?Plan: Patient was placed back into a 3 layer compression wrap. ? ?Follow-Up Instructions: Return in about 1 week (around 12/11/2021).  ? ?Ortho Exam ? ?Patient is alert, oriented, no adenopathy, well-dressed, normal affect, normal respiratory effort. ?Examination patient has excellent healing and epithelization of the lateral aspect of the left foot.  He now has an ulcer that is 10 mm in diameter with healthy granulation tissue 1 mm deep there is no undermining no odor no drainage no exposed bone or tendon.  He has significant decrease swelling in the left lower extremity. ? ?Imaging: ?No results found. ?No images are attached to the encounter. ? ?Labs: ?Lab Results  ?Component Value Date  ? HGBA1C 7.9 (H) 06/14/2021  ? HGBA1C 5.7 (H) 09/16/2018  ? HGBA1C 7.7 (H) 05/15/2018  ? ESRSEDRATE 40 (H) 09/03/2021  ? ESRSEDRATE 17 (H) 06/24/2021  ? ESRSEDRATE 35 (H) 09/03/2016  ? CRP 2.7 (H) 09/07/2021  ? CRP 6.4 (H) 09/06/2021  ? CRP 8.4 (H) 09/05/2021  ? REPTSTATUS 09/07/2021 FINAL 09/02/2021  ? GRAMSTAIN  06/14/2021  ?  NO ORGANISMS SEEN SQUAMOUS EPITHELIAL CELLS PRESENT ?ABUNDANT WBC PRESENT,BOTH PMN AND MONONUCLEAR ?MODERATE GRAM POSITIVE COCCI ?  ? CULT  09/02/2021  ?  NO GROWTH 5 DAYS ?Performed at [facility], [place] [address], [place], [place] [address] ?  ? LABORGA ENTEROCOCCUS FAECALIS 06/14/2021  ? ? ? ?Lab  Results  ?Component Value Date  ? ALBUMIN 3.0 (L) 09/07/2021  ? ALBUMIN 3.1 (L) 09/06/2021  ? ALBUMIN 2.7 (L) 09/05/2021  ? PREALBUMIN 19.0 05/04/2016  ? PREALBUMIN 25.0 12/16/2015  ? ? ?Lab Results  ?Component Value Date  ? MG 1.9 05/24/2018  ? MG 2.1 05/23/2018  ? MG 1.3 (L) 05/22/2018  ? ?No results found for: VD25OH ? ?Lab Results  ?Component Value Date  ? PREALBUMIN 19.0 05/04/2016  ? PREALBUMIN 25.0 12/16/2015  ? ? ?  Latest Ref Rng & Units 09/07/2021  ?  2:38 AM 09/06/2021  ?  1:26 AM 09/05/2021  ?  1:51 AM  ?CBC EXTENDED  ?WBC 4.0 - 10.5 K/uL 4.8   2.3   3.8    ?RBC 4.22 - 5.81 MIL/uL 3.72   3.96   3.35    ?Hemoglobin 13.0 - 17.0 g/dL 11.1   11.9   10.2    ?HCT 39.0 - 52.0 % 33.0   34.9   30.1    ?Platelets 150 - 400 K/uL 151   117   103    ?NEUT# 1.7 - 7.7 K/uL 3.6   1.9   2.1    ?Lymph# 0.7 - 4.0 K/uL 0.8   0.3   1.1    ? ? ? ?There is no height  or weight on file to calculate BMI. ? ?Orders:  ?No orders of the defined types were placed in this encounter. ? ?No orders of the defined types were placed in this encounter. ? ? ? Procedures: ?No procedures performed ? ?Clinical Data: ?No additional findings. ? ?ROS: ? ?All other systems negative, except as noted in the HPI. ?Review of Systems ? ?Objective: ?Vital Signs: There were no vitals taken for this visit. ? ?Specialty Comments:  ?No specialty comments available. ? ?PMFS History: ?Patient Active Problem List  ? Diagnosis Date Noted  ? 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 Vitreous hemorrhage of right eye ([place]) 11/27/2021  ? Acute metabolic encephalopathy 53/61/4431  ? COVID 09/02/2021  ? Sepsis ([place]) 09/02/2021  ? Severe sepsis with lactic acidosis ([place]) 06/14/2021  ? Infection associated with implanted penile prosthesis ([place]) 06/14/2021  ? ESRD on hemodialysis ([place]) 06/14/2021  ? Paroxysmal atrial fibrillation with RVR ([place]) 06/14/2021  ? Carotid stenosis 06/15/2020   ? Dyslipidemia 06/15/2020  ? Severe nonproliferative diabetic retinopathy of right eye, with macular edema, associated with type 2 diabetes mellitus ([place]) 01/18/2020  ? Severe nonproliferative diabetic retinopathy of left eye, with macular edema, associated with type 2 diabetes mellitus ([place]) 01/04/2020  ? Retinal hemorrhage of right eye 01/04/2020  ? 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 Pseudoclaudication 11/15/2018  ? Essential hypertension 09/18/2018  ? S/P BKA (below knee amputation), right ([place]) 09/18/2018  ? Amputation of toe of left foot ([place]) 09/18/2018  ? Peripheral artery disease ([place]) 09/18/2018  ? S/P carotid endarterectomy 09/18/2018  ? OSA on CPAP 09/18/2018  ? Chronic back pain 09/18/2018  ? Status post reversal of ileostomy 05/21/2018  ? Normocytic anemia 02/14/2018  ? Intra-abdominal abscess ([place]) 11/04/2017  ? Chronic venous hypertension (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 ([place]) 12/11/2016  ? Ischemic ulcer of left foot, limited to breakdown of skin ([place]) 11/12/2016  ? Unilateral primary osteoarthritis, left knee 10/11/2016  ? History of right below knee amputation ([place]) 07/12/2016  ? Acute osteomyelitis of left foot ([place])   ? Diabetic foot infection ([place]) 05/04/2016  ? Insulin dependent type 2 diabetes mellitus ([place]) 12/16/2015  ? Amputated toe ([place]) 10/21/2015  ? Leg edema, left 09/03/2015  ? CAD (dz of distal, mid and proximal RCA with implantation of 3 overlapping drug-eluting stent,) 09/03/2015  ? Chronic diastolic heart failure, NYHA class 2 ([place]) 09/03/2015  ? Angina pectoris associated with type 2 diabetes mellitus ([place]) 10/28/2014  ? Hyperlipidemia 08/25/2014  ? Limb pain 03/20/2013  ? DJD (degenerative joint disease) 09/25/2012  ? Migraine 09/25/2012  ? Neuropathy 09/25/2012  ? Restless legs syndrome (RLS) 09/25/2012  ? Chronic obstructive pulmonary disease, unspecified ([place]) 04/25/2012  ? Unknown cause of morbidity or mortality 04/25/2012  ? Chronic total  occlusion of artery of the extremities ([place]) 04/08/2012  ? Onychomycosis 02/01/2012  ? Occlusion and stenosis of carotid artery without mention of cerebral infarction 06/12/2011  ? GERD (gastroesophageal reflux disease) 05/08/2011  ? Barrett's esophagus without dysplasia 05/08/2011  ? Former tobacco use 02/01/2011  ? ?Past Medical History:  ?Diagnosis Date  ? Carotid artery occlusion 11/10/10  ? LEFT CAROTID ENDARTERECTOMY  ? Chronic kidney disease   ? Complication of anesthesia   ? BP WENT UP AT [facility] "  ? COPD (chronic obstructive pulmonary disease) ([place])   ? pt denies this dx as of 06/01/20 - no inhaler   ? Diabetes mellitus  without complication ([place])   ? Diverticulitis   ? Diverticulosis of colon (without mention of hemorrhage)   ? DJD (degenerative joint disease)   ? knees/hands/feet/back/neck  ? Fatty liver   ? Full dentures   ? GERD (gastroesophageal reflux disease)   ? H/O hiatal hernia   ? History of blood transfusion   ? with a past surical procedure per patient 06/01/20  ? Hyperlipidemia   ? Hypertension   ? Neuromuscular disorder ([place])   ? peripheral neuropathy  ? Non-pressure chronic ulcer of other part of left foot limited to breakdown of skin ([place]) 11/12/2016  ? Osteomyelitis ([place])   ? left 5th metatarsal  ? PAD (peripheral artery disease) ([place])   ? Distal aortogram June 2012. Atherectomy left popliteal artery July 2012.   ? Pseudoclaudication 11/15/2018  ? Sleep apnea   ? pt denies this dx as of 06/01/20  ? Slurred speech   ? AS PER WIFE IN D/C NOTE 11/10/10  ? 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 Unstable angina ([place]) 09/16/2018  ? Wears glasses   ?  ?Family History  ?Problem Relation Age of Onset  ? Heart disease Father   ?     Before age 6-  CAD, BPG  ? Diabetes Father   ?     Amputation  ? Cancer Father   ?     PROSTATE  ? Hyperlipidemia Father   ? Hypertension Father   ? Heart attack Father   ?     Triple BPG  ? Varicose Veins Father   ? Cancer Sister   ?     Breast  ? Hyperlipidemia Sister   ? Hypertension  Sister   ? Heart attack Brother   ? Colon cancer Brother   ? Diabetes Brother   ? Heart disease Brother 21  ?     A-Fib. Before age 28  ? Hyperlipidemia Brother   ? Hypertension Brother   ? Hypertension Son   ? Ar

## 2021-12-11 ENCOUNTER — Ambulatory Visit (INDEPENDENT_AMBULATORY_CARE_PROVIDER_SITE_OTHER): Payer: 59 | Admitting: Orthopedic Surgery

## 2021-12-11 DIAGNOSIS — I87332 Chronic venous hypertension (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: Secondary | ICD-10-CM | POA: Diagnosis not present

## 2021-12-11 DIAGNOSIS — L97521 Non-pressure chronic ulcer of other part of left foot limited to breakdown of skin: Secondary | ICD-10-CM | POA: Diagnosis not present

## 2021-12-11 DIAGNOSIS — Z89511 Acquired absence of right leg below knee: Secondary | ICD-10-CM | POA: Diagnosis not present

## 2021-12-12 ENCOUNTER — Encounter: Payer: Self-pay | Admitting: Orthopedic Surgery

## 2021-12-12 NOTE — Progress Notes (Signed)
? ?Office Visit Note ?  ?Patient: [name]           ?Date of Birth: [birth year]           ?MRN: [id number] ?Visit Date: 12/11/2021 ?             ?Requested by: [name], PA-C ?[address] ?[place],  [place] [address] ?PCP: [name], PA-C ? ?Chief Complaint  ?Patient presents with  ? Left Foot - Wound Check  ? ? ? ? ?HPI: ?Patient is 6 months status post endovascular reconstruction for the left lower extremity and 6 years status post excision of infected base of the fifth metatarsal and cuboid left foot. ? ?Assessment & Plan: ?Visit Diagnoses:  ?1. Chronic venous hypertension (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 ([place])   ?2. History of right below knee amputation ([place])   ?3. Ischemic ulcer of left foot, limited to breakdown of skin ([place])   ? ? ?Plan: Patient iscurrently in excellent healing we will have him advance to his compression socks. ? ?Follow-Up Instructions: Return in about 4 weeks (around 01/08/2022).  ? ?Ortho Exam ? ?Patient is alert, oriented, no adenopathy, well-dressed, normal affect, normal respiratory effort. ?Examination the lateral foot wound is completely healed there is good epithelization there is no redness no cellulitis no swelling no drainage.  Patient has excellent decrease in the edema in his leg. ? ?Imaging: ?No results found. ?No images are attached to the encounter. ? ?Labs: ?Lab Results  ?Component Value Date  ? HGBA1C 7.9 (H) 06/14/2021  ? HGBA1C 5.7 (H) 09/16/2018  ? HGBA1C 7.7 (H) 05/15/2018  ? ESRSEDRATE 40 (H) 09/03/2021  ? ESRSEDRATE 17 (H) 06/24/2021  ? ESRSEDRATE 35 (H) 09/03/2016  ? CRP 2.7 (H) 09/07/2021  ? CRP 6.4 (H) 09/06/2021  ? CRP 8.4 (H) 09/05/2021  ? REPTSTATUS 09/07/2021 FINAL 09/02/2021  ? GRAMSTAIN  06/14/2021  ?  NO ORGANISMS SEEN SQUAMOUS EPITHELIAL CELLS PRESENT ?ABUNDANT WBC PRESENT,BOTH PMN AND MONONUCLEAR ?MODERATE GRAM POSITIVE COCCI ?  ? CULT  09/02/2021  ?  NO GROWTH 5 DAYS ?Performed at [facility], [place] [address] [place], [place] [address] ?  ? LABORGA ENTEROCOCCUS FAECALIS 06/14/2021  ? ? ? ?Lab Results  ?Component Value Date  ? ALBUMIN 3.0 (L) 09/07/2021  ? ALBUMIN 3.1 (L) 09/06/2021  ? ALBUMIN 2.7 (L) 09/05/2021  ? PREALBUMIN 19.0 05/04/2016  ? PREALBUMIN 25.0 12/16/2015  ? ? ?Lab Results  ?Component Value Date  ? MG 1.9 05/24/2018  ? MG 2.1 05/23/2018  ? MG 1.3 (L) 05/22/2018  ? ?No results found for: VD25OH ? ?Lab Results  ?Component Value Date  ? PREALBUMIN 19.0 05/04/2016  ? PREALBUMIN 25.0 12/16/2015  ? ? ?  Latest Ref Rng & Units 09/07/2021  ?  2:38 AM 09/06/2021  ?  1:26 AM 09/05/2021  ?  1:51 AM  ?CBC EXTENDED  ?WBC 4.0 - 10.5 K/uL 4.8   2.3   3.8    ?RBC 4.22 - 5.81 MIL/uL 3.72   3.96   3.35    ?Hemoglobin 13.0 - 17.0 g/dL 11.1   11.9   10.2    ?HCT 39.0 - 52.0 % 33.0   34.9   30.1    ?Platelets 150 - 400 K/uL 151   117   103    ?NEUT# 1.7 - 7.7 K/uL 3.6   1.9   2.1    ?Lymph# 0.7 - 4.0 K/uL 0.8   0.3  1.1    ? ? ? ?There is no height or weight on file to calculate BMI. ? ?Orders:  ?No orders of the defined types were placed in this encounter. ? ?No orders of the defined types were placed in this encounter. ? ? ? Procedures: ?No procedures performed ? ?Clinical Data: ?No additional findings. ? ?ROS: ? ?All other systems negative, except as noted in the HPI. ?Review of Systems ? ?Objective: ?Vital Signs: There were no vitals taken for this visit. ? ?Specialty Comments:  ?No specialty comments available. ? ?PMFS History: ?Patient Active Problem List  ? Diagnosis Date Noted  ? 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 Vitreous hemorrhage of right eye ([place]) 11/27/2021  ? Acute metabolic encephalopathy 96/22/2979  ? COVID 09/02/2021  ? Sepsis ([place]) 09/02/2021  ? Severe sepsis with lactic acidosis ([place]) 06/14/2021  ? Infection associated with implanted penile prosthesis ([place]) 06/14/2021  ? ESRD on hemodialysis ([place]) 06/14/2021   ? Paroxysmal atrial fibrillation with RVR ([place]) 06/14/2021  ? Carotid stenosis 06/15/2020  ? Dyslipidemia 06/15/2020  ? Severe nonproliferative diabetic retinopathy of right eye, with macular edema, associated with type 2 diabetes mellitus ([place]) 01/18/2020  ? Severe nonproliferative diabetic retinopathy of left eye, with macular edema, associated with type 2 diabetes mellitus ([place]) 01/04/2020  ? Retinal hemorrhage of right eye 01/04/2020  ? 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 Pseudoclaudication 11/15/2018  ? Essential hypertension 09/18/2018  ? S/P BKA (below knee amputation), right ([place]) 09/18/2018  ? Amputation of toe of left foot ([place]) 09/18/2018  ? Peripheral artery disease ([place]) 09/18/2018  ? S/P carotid endarterectomy 09/18/2018  ? OSA on CPAP 09/18/2018  ? Chronic back pain 09/18/2018  ? Status post reversal of ileostomy 05/21/2018  ? Normocytic anemia 02/14/2018  ? Intra-abdominal abscess ([place]) 11/04/2017  ? Chronic venous hypertension (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 ([place]) 12/11/2016  ? Ischemic ulcer of left foot, limited to breakdown of skin ([place]) 11/12/2016  ? Unilateral primary osteoarthritis, left knee 10/11/2016  ? History of right below knee amputation ([place]) 07/12/2016  ? Acute osteomyelitis of left foot ([place])   ? Diabetic foot infection ([place]) 05/04/2016  ? Insulin dependent type 2 diabetes mellitus ([place]) 12/16/2015  ? Amputated toe ([place]) 10/21/2015  ? Leg edema, left 09/03/2015  ? CAD (dz of distal, mid and proximal RCA with implantation of 3 overlapping drug-eluting stent,) 09/03/2015  ? Chronic diastolic heart failure, NYHA class 2 ([place]) 09/03/2015  ? Angina pectoris associated with type 2 diabetes mellitus ([place]) 10/28/2014  ? Hyperlipidemia 08/25/2014  ? Limb pain 03/20/2013  ? DJD (degenerative joint disease) 09/25/2012  ? Migraine 09/25/2012  ? Neuropathy 09/25/2012  ? Restless legs syndrome (RLS) 09/25/2012  ? Chronic obstructive pulmonary disease, unspecified  ([place]) 04/25/2012  ? Unknown cause of morbidity or mortality 04/25/2012  ? Chronic total occlusion of artery of the extremities ([place]) 04/08/2012  ? Onychomycosis 02/01/2012  ? Occlusion and stenosis of carotid artery without mention of cerebral infarction 06/12/2011  ? GERD (gastroesophageal reflux disease) 05/08/2011  ? Barrett's esophagus without dysplasia 05/08/2011  ? Former tobacco use 02/01/2011  ? ?Past Medical History:  ?Diagnosis Date  ? Carotid artery occlusion 11/10/10  ? LEFT CAROTID ENDARTERECTOMY  ? Chronic kidney disease   ? Complication of anesthesia   ? BP WENT UP AT [facility] "  ? COPD (chronic obstructive pulmonary disease) ([place])   ? pt denies this dx  as of 06/01/20 - no inhaler   ? Diabetes mellitus without complication ([place])   ? Diverticulitis   ? Diverticulosis of colon (without mention of hemorrhage)   ? DJD (degenerative joint disease)   ? knees/hands/feet/back/neck  ? Fatty liver   ? Full dentures   ? GERD (gastroesophageal reflux disease)   ? H/O hiatal hernia   ? History of blood transfusion   ? with a past surical procedure per patient 06/01/20  ? Hyperlipidemia   ? Hypertension   ? Neuromuscular disorder ([place])   ? peripheral neuropathy  ? Non-pressure chronic ulcer of other part of left foot limited to breakdown of skin ([place]) 11/12/2016  ? Osteomyelitis ([place])   ? left 5th metatarsal  ? PAD (peripheral artery disease) ([place])   ? Distal aortogram June 2012. Atherectomy left popliteal artery July 2012.   ? Pseudoclaudication 11/15/2018  ? Sleep apnea   ? pt denies this dx as of 06/01/20  ? Slurred speech   ? AS PER WIFE IN D/C NOTE 11/10/10  ? 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 Unstable angina ([place]) 09/16/2018  ? Wears glasses   ?  ?Family History  ?Problem Relation Age of Onset  ? Heart disease Father   ?     Before age 49-  CAD, BPG  ? Diabetes Father   ?     Amputation  ? Cancer Father   ?     PROSTATE  ? Hyperlipidemia Father   ? Hypertension Father   ? Heart attack Father   ?     Triple BPG  ? Varicose Veins  Father   ? Cancer Sister   ?     Breast  ? Hyperlipidemia Sister   ? Hypertension Sister   ? Heart attack Brother   ? Colon cancer Brother   ? Diabetes Brother   ? Heart disease Brother 18  ?     A-Fib.

## 2021-12-15 ENCOUNTER — Other Ambulatory Visit: Payer: Medicare Other | Admitting: Student

## 2021-12-15 DIAGNOSIS — N186 End stage renal disease: Secondary | ICD-10-CM

## 2021-12-15 DIAGNOSIS — Z515 Encounter for palliative care: Secondary | ICD-10-CM

## 2021-12-15 DIAGNOSIS — K5900 Constipation, unspecified: Secondary | ICD-10-CM

## 2021-12-15 DIAGNOSIS — R110 Nausea: Secondary | ICD-10-CM

## 2021-12-15 DIAGNOSIS — R52 Pain, unspecified: Secondary | ICD-10-CM

## 2021-12-15 DIAGNOSIS — M86172 Other acute osteomyelitis, left ankle and foot: Secondary | ICD-10-CM

## 2021-12-15 NOTE — Progress Notes (Signed)
? ? ?Manufacturing engineer ?Community Palliative Care Consult Note ?Telephone: [phone]  ?Fax: [phone]  ? ? ?Date of encounter: 12/15/21 ?2:51 PM ?PATIENT NAME: [name] ?[place] [address]   ?[phone] (home)  ?DOB: [birth year] ?MRN: [id number] ?PRIMARY CARE PROVIDER:    ?[name], Hershal Coria,  ?[address] ?[place] [address] ?[phone] ? ?REFERRING PROVIDER:   ?[name], PA-C ?[address] ?[place],  [place] [address] ?[phone] ? ?RESPONSIBLE PARTY:    ?Contact Information   ? ? Name Relation Home Work Mobile  ? Cathay Spouse  [phone]  ? Broadwater,Dustin Son   [phone]  ? ?  ? ? ?Due to the COVID-19 crisis, this visit was done via telemedicine from my office and it was initiated and consent by this patient and or family. ? ?I connected with  [place] PROXY on 12/15/21 by a video enabled telemedicine application and verified that I am speaking with the correct person using two identifiers. ?  ?I discussed the limitations of evaluation and management by telemedicine. The patient expressed understanding and agreed to proceed.  ? ?                                 ASSESSMENT AND PLAN / RECOMMENDATIONS:  ? ?Advance Care Planning/Goals of Care: Goals include to maximize quality of life and symptom management. Patient/health care surrogate gave his/her permission to discuss. ?Our advance care planning conversation included a discussion about:    ?The value and importance of advance care planning  ?Experiences with loved ones who have been seriously ill or have died  ?Exploration of personal, cultural or spiritual beliefs that might influence medical decisions  ?Exploration of goals of care in the event of a sudden injury or illness  ?CODE STATUS: Full Code ? ?Symptom Management/Plan: ? ?ESRD-continue HD on Tuesday/Thursday/Saturday as directed. Patient reports having more bad days, fatigue after HD. He is going to discuss  with nephrologist as he feels they are pulling off too much fluid and blood pressure is drooping. He is receiving Midodrine on his HD days.  ? ?Pain-chronic pain; pain managed with current regimen. Continue fentanyl 50 mcg every 72 hours, percocet TID PRN, Lyrica 100 mg BID directed. Will send script. ? ?Constipation-currently managed; continue colace daily, Linzess PRN.  ? ?Nausea-patient reports having episodes of nausea and vomiting 1-2 times a week. continue Zofran 4 mg every 8 hours PRN. ? ?Chronic left foot vascular wound-acute osteomyelitis. Continue wound care compression wraps as directed. Continue doxycycline as directed. Patient current seeing [name] with orthopedic surgery; follow up as scheduled.  ? ?Follow up Palliative Care Visit: Palliative care will continue to follow for complex medical decision making, advance care planning, and clarification of goals. Return in 4-8 weeks or prn. ? ? ?This visit was coded based on medical decision making (MDM). ? ?PPS: 40% ? ?HOSPICE ELIGIBILITY/DIAGNOSIS: TBD ? ?Chief Complaint: Palliative Medicine follow up visit.  ? ?HISTORY OF PRESENT ILLNESS:  [name] is a 66 y.o. 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  ? ?Patient reports having more days where he is weak, fatigued. Increased difficulty ambulating and transferring. He  feels they are pulling off too much fluid at dialysis. He is reconsidering HD if he continues to feel bad. Appetite varies, some days very little appetite. Weight has been stable. Blood sugars 200 or less; has [employer]. Reports nausea and vomiting 1-2 times a week. Reports bowel movements usually every other day. Still voiding; amount varies. He is  sleeping more. He saw orthopedic surgery this past Monday. His wound looked worse and he was started on doxycycline. He is back in compression sock with silver; he is to follow up as scheduled. Endorses chronic pain, phantom pain worse some days.  ? ?History obtained from review of EMR, discussion with primary team, and interview with family, facility staff/caregiver and/or [name].  ?I reviewed available labs, medications, imaging, studies and related documents from the EMR.  Records reviewed and summarized above.  ? ? ?Physical Exam: ?Constitutional: NAD ?General: frail appearing ?EYES: anicteric sclera, lids intact, no discharge  ?ENMT: intact hearing, dentition intact ?Pulmonary: no increased work of breathing, no cough, room air ?Abdomen: no ascites ?GU: deferred ?MSK:  moves all extremities, right BKA ?Skin: no rashes or wounds on visible skin ?Neuro: +generalized weakness,  no cognitive impairment ?Psych: non-anxious affect, A and O x 3 ?Hem/lymph/immuno: no widespread bruising ? ? ?Thank you for the opportunity to participate in the care of [name].  The palliative care team will continue to follow. Please call our office at [phone] if we can be of additional assistance.  ? ?Ezekiel Slocumb, NP  ? ?COVID-19 PATIENT SCREENING TOOL ?Asked and negative response unless otherwise noted:  ? ?Have you had symptoms of covid, tested positive or been in contact with someone with symptoms/positive test in the past 5-10 days? No ? ?

## 2021-12-27 ENCOUNTER — Telehealth: Payer: Self-pay | Admitting: Student

## 2021-12-27 NOTE — Telephone Encounter (Signed)
Palliative NP returned call to patient. Script for his oxycodone-acetaminophen sent to [facility] off [address], [place]. Spoke with pharmacy and they have in stock. ?

## 2022-01-01 ENCOUNTER — Encounter (INDEPENDENT_AMBULATORY_CARE_PROVIDER_SITE_OTHER): Payer: Self-pay | Admitting: Ophthalmology

## 2022-01-01 ENCOUNTER — Ambulatory Visit (INDEPENDENT_AMBULATORY_CARE_PROVIDER_SITE_OTHER): Payer: 59 | Admitting: Ophthalmology

## 2022-01-01 ENCOUNTER — Encounter: Payer: Self-pay | Admitting: Orthopedic Surgery

## 2022-01-01 ENCOUNTER — Ambulatory Visit (INDEPENDENT_AMBULATORY_CARE_PROVIDER_SITE_OTHER): Payer: 59 | Admitting: Orthopedic Surgery

## 2022-01-01 DIAGNOSIS — H4311 Vitreous hemorrhage, right eye: Secondary | ICD-10-CM

## 2022-01-01 DIAGNOSIS — L97521 Non-pressure chronic ulcer of other part of left foot limited to breakdown of skin: Secondary | ICD-10-CM

## 2022-01-01 DIAGNOSIS — I87332 Chronic venous hypertension (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: Secondary | ICD-10-CM | POA: Diagnosis not present

## 2022-01-01 DIAGNOSIS — E113511 Type 2 diabetes mellitus with proliferative diabetic retinopathy with macular edema, right eye: Secondary | ICD-10-CM

## 2022-01-01 MED ORDER — AFLIBERCEPT 2MG/0.05ML IZ SOLN FOR KALEIDOSCOPE
2.0000 mg | INTRAVITREAL | Status: AC | PRN
  Administered 2022-01-01: 2 mg via INTRAVITREAL

## 2022-01-01 NOTE — Progress Notes (Signed)
? ? ?01/01/2022 ? ?  ? ?CHIEF COMPLAINT ?Patient presents for  ?Chief Complaint  ?Patient presents with  ? Diabetic Retinopathy with Macular Edema  ? ? ? ? ?HISTORY OF PRESENT ILLNESS: ?[name] is a 66 y.o. male who presents to the clinic today for:  ? ?HPI   ?5 weeks dilate OU, Eylea OU, Eylea OD, OCT, FP. ?Patient states he feels like his right eye has cleared up some. Patient states he is seeing shadows, or a little better than shadows. ? ? having left lower extremity medic skin ulcers and near the foot as well. ?Last edited by Hurman Horn, MD on 01/01/2022  3:38 PM.  ?  ? ? ?Referring physician: ?[name], PA-C ?[place] ?[place],  [place] [address] ? ?HISTORICAL INFORMATION:  ? ?Selected notes from the [facility] ?  ? ?Lab Results  ?Component Value Date  ? HGBA1C 7.9 (H) 06/14/2021  ?  ? ?CURRENT MEDICATIONS: ?Current Outpatient Medications (Ophthalmic Drugs)  ?Medication Sig  ? Polyvinyl Alcohol-Povidone PF 1.4-0.6 % SOLN Place 1 drop into both eyes 2 (two) times daily as needed (dry eyes).   ? ?No current facility-administered medications for this visit. (Ophthalmic Drugs)  ? ?Current Outpatient Medications (Other)  ?Medication Sig  ? amiodarone (PACERONE) 200 MG tablet Take 1 tablet (200 mg total) by mouth daily. Two (2) tablets for 10 days then one tab daily  ? amLODipine (NORVASC) 5 MG tablet Take 5 mg by mouth daily.  ? apixaban (ELIQUIS) 5 MG TABS tablet Take 1 tablet (5 mg total) by mouth 2 (two) times daily.  ? atorvastatin (LIPITOR) 40 MG tablet Take 40 mg by mouth at bedtime.   ? B Complex-C-Zn-Folic Acid (DIALYVITE/ZINC) TABS Take 1 tablet by mouth daily.  ? doxycycline (VIBRA-TABS) 100 MG tablet TAKE 1 TABLET BY MOUTH TWICE A DAY  ? famotidine (PEPCID) 20 MG tablet Take 1 tablet (20 mg total) by mouth daily.  ? fentaNYL (DURAGESIC) 50 MCG/HR Place 1 patch onto the skin every 3 (three) days.  ? folic acid (FOLVITE) 1 MG tablet Take 1 mg by mouth daily.  ? folic  acid-vitamin b complex-vitamin c-selenium-zinc (DIALYVITE) 3 MG TABS tablet Take 1 tablet by mouth daily.  ? Glucosamine HCl (GLUCOSAMINE PO) Take 1 tablet by mouth 2 (two) times daily.  ? HUMALOG KWIKPEN 100 UNIT/ML KwikPen Inject 4 Units into the skin 3 (three) times daily.  ? insulin glargine (LANTUS) 100 UNIT/ML injection Inject 0.1 mLs (10 Units total) into the skin at bedtime. (Patient taking differently: Inject 20 Units into the skin at bedtime.)  ? ketoconazole (NIZORAL) 2 % cream Apply 1 application topically 2 (two) times daily as needed for irritation.   ? lidocaine-prilocaine (EMLA) cream Apply 1 application topically See admin instructions. Prior to Dialysis days Tuesday,Thursday and saturday  ? linaclotide (LINZESS) 145 MCG CAPS capsule Take 145 mcg by mouth daily as needed (constipation).   ? loratadine (CLARITIN) 10 MG tablet Take 10 mg by mouth daily.  ? midodrine (PROAMATINE) 10 MG tablet Take 1 tablet (10 mg total) by mouth every other day. One hour before dialysis  ? NEEDLE, REUSABLE, 22 G 22G X 1-1/2" MISC 1 Units by Does not apply route as directed. For B12 IM inj  ? nitroGLYCERIN (NITRODUR - DOSED IN MG/24 HR) 0.2 mg/hr patch Place 1 patch (0.2 mg total) onto the skin daily.  ? omega-3 acid ethyl esters (LOVAZA) 1 g capsule Take 2 g by mouth 2 (  two) times daily.  ? ondansetron (ZOFRAN) 4 MG tablet Take 4 mg by mouth every 6 (six) hours as needed for nausea or vomiting.  ? oxyCODONE-acetaminophen (PERCOCET) 7.5-325 MG tablet Take 1 tablet by mouth 3 (three) times daily as needed for moderate pain.  ? pantoprazole (PROTONIX) 40 MG tablet Take 40 mg by mouth daily.  ? pregabalin (LYRICA) 100 MG capsule Take 100 mg by mouth 2 (two) times daily.  ? rOPINIRole (REQUIP) 2 MG tablet Take 2 mg by mouth at bedtime.  ? Syringe/Needle, Disp, (SYRINGE 3CC/22GX1-1/2") 22G X 1-1/2" 3 ML MISC 1 Syringe by Does not apply route as directed. For b12 IM inj  ? tamsulosin (FLOMAX) 0.4 MG CAPS capsule Take 0.4 mg  by mouth daily.  ? ?No current facility-administered medications for this visit. (Other)  ? ? ? ? ?REVIEW OF SYSTEMS: ?ROS   ?Positive for: Musculoskeletal ?Negative for: Constitutional, Gastrointestinal, Neurological, Skin, Genitourinary, HENT, Endocrine, Cardiovascular, Eyes, Respiratory, Psychiatric, Allergic/Imm, Heme/Lymph ?Last edited by Hurman Horn, MD on 01/01/2022  3:38 PM.  ?  ? ? ? ?ALLERGIES ?Allergies  ?Allergen Reactions  ? Contrast Media [Iodinated Contrast Media] Shortness Of Breath and Other (See Comments)  ?  Difficulty breathing and altered mental status ? ?  ? Ivp Dye [Iodinated Contrast Media] Anaphylaxis, Shortness Of Breath and Other (See Comments)  ?  Breathing problems, altered mental state ?  ? Adhesive [Tape] Rash and Other (See Comments)  ?  Rash after 1 day of use  ? Latex Rash and Other (See Comments)  ?  A severe rash appears after the first 24 hours of being placed  ? ? ?PAST MEDICAL HISTORY ?Past Medical History:  ?Diagnosis Date  ? Carotid artery occlusion 11/10/10  ? LEFT CAROTID ENDARTERECTOMY  ? Chronic kidney disease   ? Complication of anesthesia   ? BP WENT UP AT [facility] "  ? COPD (chronic obstructive pulmonary disease) ([place])   ? pt denies this dx as of 06/01/20 - no inhaler   ? Diabetes mellitus without complication ([place])   ? Diverticulitis   ? Diverticulosis of colon (without mention of hemorrhage)   ? DJD (degenerative joint disease)   ? knees/hands/feet/back/neck  ? Fatty liver   ? Full dentures   ? GERD (gastroesophageal reflux disease)   ? H/O hiatal hernia   ? History of blood transfusion   ? with a past surical procedure per patient 06/01/20  ? Hyperlipidemia   ? Hypertension   ? Neuromuscular disorder ([place])   ? peripheral neuropathy  ? Non-pressure chronic ulcer of other part of left foot limited to breakdown of skin ([place]) 11/12/2016  ? Osteomyelitis ([place])   ? left 5th metatarsal  ? PAD (peripheral artery disease) ([place])   ? Distal aortogram June 2012. Atherectomy left  popliteal artery July 2012.   ? Pseudoclaudication 11/15/2018  ? Sleep apnea   ? pt denies this dx as of 06/01/20  ? Slurred speech   ? AS PER WIFE IN D/C NOTE 11/10/10  ? 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 Unstable angina ([place]) 09/16/2018  ? Wears glasses   ? ?Past Surgical History:  ?Procedure Laterality Date  ? ABDOMINAL AORTOGRAM W/LOWER EXTREMITY N/A 06/23/2021  ? Procedure: ABDOMINAL AORTOGRAM W/LOWER EXTREMITY;  Surgeon: Nigel Mormon, MD;  Location: [facility];  Service: Cardiovascular;  Laterality: N/A;  ? AMPUTATION  11/05/2011  ? Procedure: AMPUTATION RAY;  Surgeon: Wylene Simmer, MD;  Location: [facility];  Service: Orthopedics;  Laterality:  Right;  Amputation of Right 4&5th Toes  ? AMPUTATION Left 11/26/2012  ? Procedure: AMPUTATION RAY;  Surgeon: Wylene Simmer, MD;  Location: [facility];  Service: Orthopedics;  Laterality: Left;  fourth ray amputation  ? AMPUTATION Right 08/27/2014  ? Procedure: Transmetatarsal Amputation;  Surgeon: Newt Minion, MD;  Location: [facility];  Service: Orthopedics;  Laterality: Right;  ? AMPUTATION Right 01/14/2015  ? Procedure: AMPUTATION BELOW KNEE;  Surgeon: Newt Minion, MD;  Location: [facility];  Service: Orthopedics;  Laterality: Right;  ? AMPUTATION Left 10/21/2015  ? Procedure: Left Foot 5th Ray Amputation;  Surgeon: Newt Minion, MD;  Location: [facility];  Service: Orthopedics;  Laterality: Left;  ? ANTERIOR FUSION CERVICAL SPINE  02/06/06  ? C4-5, C5-6, C6-7; SURGEON DR. MAX COHEN  ? AV FISTULA PLACEMENT Left 06/02/2020  ? Procedure: ARTERIOVENOUS (AV) FISTULA CREATION LEFT;  Surgeon: Waynetta Sandy, MD;  Location: [facility];  Service: Vascular;  Laterality: Left;  ? BACK SURGERY    ? x 3  ? BASCILIC VEIN TRANSPOSITION Left 07/21/2020  ? Procedure: LEFT UPPER ARM ATERIOVENOUS SUPERFISTULALIZATION;  Surgeon: Waynetta Sandy, MD;  Location: [facility];  Service: Vascular;  Laterality: Left;  ? BELOW KNEE LEG AMPUTATION Right   ? CARDIAC CATHETERIZATION  10/31/04  ? 2009  ?  CAROTID ENDARTERECTOMY  11/10/10  ? CAROTID ENDARTERECTOMY Left 11/10/2010  ? Subtotal occlusion of left internal carotid artery with left hemispheric transient ischemic attacks.  ? CAROTID STENT    ? CARPAL TUNNEL RELEASE Rig

## 2022-01-01 NOTE — Assessment & Plan Note (Signed)
Confirmed exacerbation as of 01-01-2022 once vitreous hemorrhage cleared to allow OCT to confirm presence of CSME.  Repeat injection Eylea today follow-up again in 1 month ? ?The PD R component will need completion of peripheral PRP once hemorrhage clears ?

## 2022-01-01 NOTE — Assessment & Plan Note (Addendum)
Vitreous hemorrhage during 1 month post injection #1 antivegF, will repeat injection today of Eylea for residual CSME and clearing of vitreous hemorrhage and PDR. ?

## 2022-01-01 NOTE — Progress Notes (Signed)
? ?Office Visit Note ?  ?Patient: [name]           ?Date of Birth: 09/07/1956           ?MRN: [id number] ?Visit Date: 01/01/2022 ?             ?Requested by: [name], PA-C ?[address] ?[place],  [place] [id number] ?PCP: [name], PA-C ? ?Chief Complaint  ?Patient presents with  ? Left Foot - Wound Check  ? ? ? ? ?HPI: ?Patient is a 66 year old gentleman who presents with a recurrent ulcer on the lateral aspect of his left foot.  Patient states that the wound had completely healed for about 4 weeks.  He states he started noticing a odor and drainage on Friday.  Patient has been having a hard time getting his compression socks on due to his increased swelling. ? ?Assessment & Plan: ?Visit Diagnoses:  ?1. Chronic venous hypertension (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 ([place])   ?2. Ischemic ulcer of left foot, limited to breakdown of skin ([place])   ? ? ?Plan: We will apply a Dynaflex wrap plus silver cell follow-up in 1 week. ? ?Follow-Up Instructions: Return in about 1 week (around 01/08/2022).  ? ?Ortho Exam ? ?Patient is alert, oriented, no adenopathy, well-dressed, normal affect, normal respiratory effort. ?Examination patient has a new ulcer over the lateral aspect of his left foot and measures 3 x 5 cm there is healthy granulation tissue at the base there is no exposed bone or tendon there is no cellulitis no drainage.  Patient has significant increased venous swelling in the left leg with new venous ulcers in the medial left calf. ? ?Imaging: ?Intravitreal Injection, Pharmacologic Agent - OD - Right Eye ? ?Result Date: 01/01/2022 ?Time Out 01/01/2022. 3:42 PM. Confirmed correct patient, procedure, site, and patient consented. Anesthesia Topical anesthesia was used. Anesthetic medications included Lidocaine 4%. Procedure Preparation included 5% betadine to ocular surface, 10% betadine to eyelids, Ofloxacin . A 30 gauge needle was used. Injection: 2 mg aflibercept 2 MG/0.05ML    Route: Intravitreal, Site: Right Eye   NDC: [id number], Lot: [id number], Waste: 0 mL Post-op Post injection exam found visual acuity of at least counting fingers. The patient tolerated the procedure well. There were no complications. The patient received written and verbal post procedure care education. Post injection medications included ocuflox.  ? ?OCT, Retina - OU - Both Eyes ? ?Result Date: 01/01/2022 ?Right Eye Quality was good. Central Foveal Thickness: 319. Findings include abnormal foveal contour, cystoid macular edema. Left Eye Quality was good. Scan locations included subfoveal. Central Foveal Thickness: 280. Progression has been stable. Findings include normal foveal contour. Notes OD now improved view through the vitreous hemorrhage as it is clearing post injection antivegF.  Will need repeat injection antivegF to clear the residual CSME OD OS normal  ?No images are attached to the encounter. ? ?Labs: ?Lab Results  ?Component Value Date  ? HGBA1C 7.9 (H) 06/14/2021  ? HGBA1C 5.7 (H) 09/16/2018  ? HGBA1C 7.7 (H) 05/15/2018  ? ESRSEDRATE 40 (H) 09/03/2021  ? ESRSEDRATE 17 (H) 06/24/2021  ? ESRSEDRATE 35 (H) 09/03/2016  ? CRP 2.7 (H) 09/07/2021  ? CRP 6.4 (H) 09/06/2021  ? CRP 8.4 (H) 09/05/2021  ? REPTSTATUS 09/07/2021 FINAL 09/02/2021  ? GRAMSTAIN  06/14/2021  ?  NO ORGANISMS SEEN SQUAMOUS EPITHELIAL CELLS PRESENT ?ABUNDANT WBC PRESENT,BOTH PMN AND MONONUCLEAR ?MODERATE GRAM POSITIVE COCCI ?  ? CULT  09/02/2021  ?  NO GROWTH 5 DAYS ?Performed at [facility], [place] [address] [place], [place] [address] ?  ? LABORGA ENTEROCOCCUS FAECALIS 06/14/2021  ? ? ? ?Lab Results  ?Component Value Date  ? ALBUMIN 3.0 (L) 09/07/2021  ? ALBUMIN 3.1 (L) 09/06/2021  ? ALBUMIN 2.7 (L) 09/05/2021  ? PREALBUMIN 19.0 05/04/2016  ? PREALBUMIN 25.0 12/16/2015  ? ? ?Lab Results  ?Component Value Date  ? MG 1.9 05/24/2018  ? MG 2.1 05/23/2018  ? MG 1.3 (L) 05/22/2018  ? ?No results found for: VD25OH ? ?Lab Results   ?Component Value Date  ? PREALBUMIN 19.0 05/04/2016  ? PREALBUMIN 25.0 12/16/2015  ? ? ?  Latest Ref Rng & Units 09/07/2021  ?  2:38 AM 09/06/2021  ?  1:26 AM 09/05/2021  ?  1:51 AM  ?CBC EXTENDED  ?WBC 4.0 - 10.5 K/uL 4.8   2.3   3.8    ?RBC 4.22 - 5.81 MIL/uL 3.72   3.96   3.35    ?Hemoglobin 13.0 - 17.0 g/dL 11.1   11.9   10.2    ?HCT 39.0 - 52.0 % 33.0   34.9   30.1    ?Platelets 150 - 400 K/uL 151   117   103    ?NEUT# 1.7 - 7.7 K/uL 3.6   1.9   2.1    ?Lymph# 0.7 - 4.0 K/uL 0.8   0.3   1.1    ? ? ? ?There is no height or weight on file to calculate BMI. ? ?Orders:  ?No orders of the defined types were placed in this encounter. ? ?No orders of the defined types were placed in this encounter. ? ? ? Procedures: ?No procedures performed ? ?Clinical Data: ?No additional findings. ? ?ROS: ? ?All other systems negative, except as noted in the HPI. ?Review of Systems ? ?Objective: ?Vital Signs: There were no vitals taken for this visit. ? ?Specialty Comments:  ?No specialty comments available. ? ?PMFS History: ?Patient Active Problem List  ? Diagnosis Date Noted  ? 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 Vitreous hemorrhage of right eye ([place]) 11/27/2021  ? Acute metabolic encephalopathy 38/88/2800  ? COVID 09/02/2021  ? Sepsis ([place]) 09/02/2021  ? Severe sepsis with lactic acidosis ([place]) 06/14/2021  ? Infection associated with implanted penile prosthesis ([place]) 06/14/2021  ? ESRD on hemodialysis ([place]) 06/14/2021  ? Paroxysmal atrial fibrillation with RVR ([place]) 06/14/2021  ? Carotid stenosis 06/15/2020  ? Dyslipidemia 06/15/2020  ? Diabetic macular edema of right eye with proliferative retinopathy associated with type 2 diabetes mellitus ([place]) 01/18/2020  ? Severe nonproliferative diabetic retinopathy of left eye, with macular edema, associated with type 2 diabetes mellitus ([place]) 01/04/2020  ? Retinal hemorrhage of right eye  01/04/2020  ? 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 Pseudoclaudication 11/15/2018  ? Essential hypertension 09/18/2018  ? S/P BKA (below knee amputation), right ([place]) 09/18/2018  ? Amputation of toe of left foot ([place]) 09/18/2018  ? Peripheral artery disease ([place]) 09/18/2018  ? S/P carotid endarterectomy 09/18/2018  ? OSA on CPAP 09/18/2018  ? Chronic back pain 09/18/2018  ? Status post reversal of ileostomy 05/21/2018  ? Normocytic anemia 02/14/2018  ? Intra-abdominal abscess ([place]) 11/04/2017  ? Chronic venous hypertension (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 ([place]) 12/11/2016  ? Ischemic ulcer of left foot, limited to breakdown of skin ([place]) 11/12/2016  ? Unilateral primary osteoarthritis, left knee  10/11/2016  ? History of right below knee amputation ([place]) 07/12/2016  ? Acute osteomyelitis of left foot ([place])   ? Diabetic foot infection ([place]) 05/04/2016  ? Insulin dependent type 2 diabetes mellitus ([place]) 12/16/2015  ? Amputated toe ([place]) 10/21/2015  ? Leg edema, left 09/03/2015  ? CAD (dz of distal, mid and proximal RCA with implantation of 3 overlapping drug-eluting stent,) 09/03/2015  ? Chronic diastolic heart failure, NYHA class 2 ([place]) 09/03/2015  ? Angina pectoris associated with type 2 diabetes mellitus ([place]) 10/28/2014  ? Hyperlipidemia 08/25/2014  ? Limb pain 03/20/2013  ? DJD (degenerative joint disease) 09/25/2012  ? Migraine 09/25/2012  ? Neuropathy 09/25/2012  ? Restless legs syndrome (RLS) 09/25/2012  ? Chronic obstructive pulmonary disease, unspecified ([place]) 04/25/2012  ? Unknown cause of morbidity or mortality 04/25/2012  ? Chronic total occlusion of artery of the extremities ([place]) 04/08/2012  ? Onychomycosis 02/01/2012  ? Occlusion and stenosis of carotid artery without mention of cerebral infarction 06/12/2011  ? GERD (gastroesophageal reflux disease) 05/08/2011  ? Barrett's esophagus without dysplasia 05/08/2011  ? Former tobacco use 02/01/2011  ? ?Past Medical History:   ?Diagnosis Date  ? Carotid artery occlusion 11/10/10  ? LEFT CAROTID ENDARTERECTOMY  ? Chronic kidney disease   ? Complication of anesthesia   ? BP WENT UP AT [facility] "  ? COPD (chronic obstructive pulmonary disease) ([place])

## 2022-01-01 NOTE — Assessment & Plan Note (Signed)
>>  ASSESSMENT AND PLAN FOR VITREOUS HEMORRHAGE OF RIGHT EYE (HCC) WRITTEN ON 01/01/2022  3:44 PM BY ELNER KUBA A, MD  Vitreous hemorrhage during 1 month post injection #1 antivegF, will repeat injection today of Eylea  for residual CSME and clearing of vitreous hemorrhage and PDR.

## 2022-01-05 ENCOUNTER — Other Ambulatory Visit: Payer: Self-pay | Admitting: Family

## 2022-01-08 ENCOUNTER — Ambulatory Visit (INDEPENDENT_AMBULATORY_CARE_PROVIDER_SITE_OTHER): Payer: 59 | Admitting: Orthopedic Surgery

## 2022-01-08 DIAGNOSIS — I87332 Chronic venous hypertension (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: Secondary | ICD-10-CM

## 2022-01-08 DIAGNOSIS — L97521 Non-pressure chronic ulcer of other part of left foot limited to breakdown of skin: Secondary | ICD-10-CM | POA: Diagnosis not present

## 2022-01-11 ENCOUNTER — Inpatient Hospital Stay (HOSPITAL_COMMUNITY)
Admission: EM | Admit: 2022-01-11 | Discharge: 2022-01-14 | DRG: 638 | Disposition: A | Discharge status: Home Health | Payer: 59 | Attending: Student | Admitting: Student

## 2022-01-11 ENCOUNTER — Inpatient Hospital Stay (HOSPITAL_COMMUNITY): Payer: 59

## 2022-01-11 ENCOUNTER — Emergency Department (HOSPITAL_COMMUNITY): Payer: 59

## 2022-01-11 ENCOUNTER — Encounter (HOSPITAL_COMMUNITY): Payer: Self-pay | Admitting: Internal Medicine

## 2022-01-11 DIAGNOSIS — R5381 Other malaise: Secondary | ICD-10-CM | POA: Diagnosis not present

## 2022-01-11 DIAGNOSIS — L97521 Non-pressure chronic ulcer of other part of left foot limited to breakdown of skin: Secondary | ICD-10-CM | POA: Diagnosis present

## 2022-01-11 DIAGNOSIS — I132 Hypertensive heart and chronic kidney disease with heart failure and with stage 5 chronic kidney disease, or end stage renal disease: Secondary | ICD-10-CM | POA: Diagnosis present

## 2022-01-11 DIAGNOSIS — Z7901 Long term (current) use of anticoagulants: Secondary | ICD-10-CM

## 2022-01-11 DIAGNOSIS — E11628 Type 2 diabetes mellitus with other skin complications: Secondary | ICD-10-CM

## 2022-01-11 DIAGNOSIS — L89309 Pressure ulcer of unspecified buttock, unspecified stage: Secondary | ICD-10-CM | POA: Insufficient documentation

## 2022-01-11 DIAGNOSIS — L97526 Non-pressure chronic ulcer of other part of left foot with bone involvement without evidence of necrosis: Secondary | ICD-10-CM | POA: Diagnosis present

## 2022-01-11 DIAGNOSIS — E11621 Type 2 diabetes mellitus with foot ulcer: Secondary | ICD-10-CM | POA: Diagnosis present

## 2022-01-11 DIAGNOSIS — Z8249 Family history of ischemic heart disease and other diseases of the circulatory system: Secondary | ICD-10-CM

## 2022-01-11 DIAGNOSIS — R1110 Vomiting, unspecified: Secondary | ICD-10-CM | POA: Diagnosis not present

## 2022-01-11 DIAGNOSIS — Z79899 Other long term (current) drug therapy: Secondary | ICD-10-CM

## 2022-01-11 DIAGNOSIS — R112 Nausea with vomiting, unspecified: Secondary | ICD-10-CM | POA: Diagnosis not present

## 2022-01-11 DIAGNOSIS — K2270 Barrett's esophagus without dysplasia: Secondary | ICD-10-CM | POA: Diagnosis present

## 2022-01-11 DIAGNOSIS — Z91158 Patient's noncompliance with renal dialysis for other reason: Secondary | ICD-10-CM

## 2022-01-11 DIAGNOSIS — E1151 Type 2 diabetes mellitus with diabetic peripheral angiopathy without gangrene: Secondary | ICD-10-CM | POA: Diagnosis present

## 2022-01-11 DIAGNOSIS — E1142 Type 2 diabetes mellitus with diabetic polyneuropathy: Secondary | ICD-10-CM | POA: Diagnosis present

## 2022-01-11 DIAGNOSIS — I5032 Chronic diastolic (congestive) heart failure: Secondary | ICD-10-CM | POA: Diagnosis present

## 2022-01-11 DIAGNOSIS — I482 Chronic atrial fibrillation, unspecified: Secondary | ICD-10-CM

## 2022-01-11 DIAGNOSIS — E1169 Type 2 diabetes mellitus with other specified complication: Principal | ICD-10-CM | POA: Diagnosis present

## 2022-01-11 DIAGNOSIS — I480 Paroxysmal atrial fibrillation: Secondary | ICD-10-CM | POA: Diagnosis present

## 2022-01-11 DIAGNOSIS — D631 Anemia in chronic kidney disease: Secondary | ICD-10-CM | POA: Diagnosis present

## 2022-01-11 DIAGNOSIS — Z89511 Acquired absence of right leg below knee: Secondary | ICD-10-CM

## 2022-01-11 DIAGNOSIS — G4733 Obstructive sleep apnea (adult) (pediatric): Secondary | ICD-10-CM | POA: Insufficient documentation

## 2022-01-11 DIAGNOSIS — Z96651 Presence of right artificial knee joint: Secondary | ICD-10-CM | POA: Diagnosis present

## 2022-01-11 DIAGNOSIS — M86672 Other chronic osteomyelitis, left ankle and foot: Secondary | ICD-10-CM | POA: Diagnosis present

## 2022-01-11 DIAGNOSIS — Z6833 Body mass index (BMI) 33.0-33.9, adult: Secondary | ICD-10-CM

## 2022-01-11 DIAGNOSIS — E785 Hyperlipidemia, unspecified: Secondary | ICD-10-CM | POA: Diagnosis present

## 2022-01-11 DIAGNOSIS — N400 Enlarged prostate without lower urinary tract symptoms: Secondary | ICD-10-CM | POA: Diagnosis present

## 2022-01-11 DIAGNOSIS — M8660 Other chronic osteomyelitis, unspecified site: Secondary | ICD-10-CM | POA: Diagnosis present

## 2022-01-11 DIAGNOSIS — M86172 Other acute osteomyelitis, left ankle and foot: Secondary | ICD-10-CM | POA: Diagnosis not present

## 2022-01-11 DIAGNOSIS — Z83438 Family history of other disorder of lipoprotein metabolism and other lipidemia: Secondary | ICD-10-CM

## 2022-01-11 DIAGNOSIS — I739 Peripheral vascular disease, unspecified: Secondary | ICD-10-CM | POA: Diagnosis not present

## 2022-01-11 DIAGNOSIS — I2510 Atherosclerotic heart disease of native coronary artery without angina pectoris: Secondary | ICD-10-CM | POA: Diagnosis present

## 2022-01-11 DIAGNOSIS — Z992 Dependence on renal dialysis: Secondary | ICD-10-CM | POA: Diagnosis not present

## 2022-01-11 DIAGNOSIS — J449 Chronic obstructive pulmonary disease, unspecified: Secondary | ICD-10-CM | POA: Diagnosis present

## 2022-01-11 DIAGNOSIS — E1165 Type 2 diabetes mellitus with hyperglycemia: Secondary | ICD-10-CM | POA: Diagnosis present

## 2022-01-11 DIAGNOSIS — Z800 Family history of malignant neoplasm of digestive organs: Secondary | ICD-10-CM

## 2022-01-11 DIAGNOSIS — I10 Essential (primary) hypertension: Secondary | ICD-10-CM | POA: Diagnosis present

## 2022-01-11 DIAGNOSIS — N189 Chronic kidney disease, unspecified: Secondary | ICD-10-CM

## 2022-01-11 DIAGNOSIS — E1122 Type 2 diabetes mellitus with diabetic chronic kidney disease: Secondary | ICD-10-CM | POA: Diagnosis present

## 2022-01-11 DIAGNOSIS — F1120 Opioid dependence, uncomplicated: Secondary | ICD-10-CM | POA: Diagnosis present

## 2022-01-11 DIAGNOSIS — N186 End stage renal disease: Secondary | ICD-10-CM | POA: Diagnosis present

## 2022-01-11 DIAGNOSIS — M869 Osteomyelitis, unspecified: Secondary | ICD-10-CM | POA: Diagnosis present

## 2022-01-11 DIAGNOSIS — Z794 Long term (current) use of insulin: Secondary | ICD-10-CM

## 2022-01-11 DIAGNOSIS — I25118 Atherosclerotic heart disease of native coronary artery with other forms of angina pectoris: Secondary | ICD-10-CM | POA: Diagnosis present

## 2022-01-11 DIAGNOSIS — E669 Obesity, unspecified: Secondary | ICD-10-CM | POA: Diagnosis present

## 2022-01-11 DIAGNOSIS — I5030 Unspecified diastolic (congestive) heart failure: Secondary | ICD-10-CM | POA: Diagnosis present

## 2022-01-11 DIAGNOSIS — Z955 Presence of coronary angioplasty implant and graft: Secondary | ICD-10-CM

## 2022-01-11 DIAGNOSIS — F17290 Nicotine dependence, other tobacco product, uncomplicated: Secondary | ICD-10-CM | POA: Diagnosis present

## 2022-01-11 DIAGNOSIS — L089 Local infection of the skin and subcutaneous tissue, unspecified: Secondary | ICD-10-CM | POA: Diagnosis not present

## 2022-01-11 DIAGNOSIS — Z8673 Personal history of transient ischemic attack (TIA), and cerebral infarction without residual deficits: Secondary | ICD-10-CM

## 2022-01-11 DIAGNOSIS — Z833 Family history of diabetes mellitus: Secondary | ICD-10-CM

## 2022-01-11 DIAGNOSIS — L8990 Pressure ulcer of unspecified site, unspecified stage: Secondary | ICD-10-CM | POA: Insufficient documentation

## 2022-01-11 LAB — COMPREHENSIVE METABOLIC PANEL
ALT: 10 U/L (ref 0–44)
AST: 14 U/L — ABNORMAL LOW (ref 15–41)
Albumin: 2.9 g/dL — ABNORMAL LOW (ref 3.5–5.0)
Alkaline Phosphatase: 122 U/L (ref 38–126)
Anion gap: 14 (ref 5–15)
BUN: 50 mg/dL — ABNORMAL HIGH (ref 8–23)
CO2: 23 mmol/L (ref 22–32)
Calcium: 7.6 mg/dL — ABNORMAL LOW (ref 8.9–10.3)
Chloride: 98 mmol/L (ref 98–111)
Creatinine, Ser: 7.59 mg/dL — ABNORMAL HIGH (ref 0.61–1.24)
GFR, Estimated: 7 mL/min — ABNORMAL LOW (ref >60)
Glucose, Bld: 317 mg/dL — ABNORMAL HIGH (ref 70–99)
Potassium: 4 mmol/L (ref 3.5–5.1)
Sodium: 135 mmol/L (ref 135–145)
Total Bilirubin: 0.5 mg/dL (ref 0.3–1.2)
Total Protein: 5.9 g/dL — ABNORMAL LOW (ref 6.5–8.1)

## 2022-01-11 LAB — SEDIMENTATION RATE: Sed Rate: 55 mm/hr — ABNORMAL HIGH (ref 0–16)

## 2022-01-11 LAB — URINALYSIS, ROUTINE W REFLEX MICROSCOPIC
Bacteria, UA: NONE SEEN
Bilirubin Urine: NEGATIVE
Glucose, UA: >=500 mg/dL — AB
Ketones, ur: NEGATIVE mg/dL
Nitrite: NEGATIVE
Protein, ur: 100 mg/dL — AB
Specific Gravity, Urine: 1.014 (ref 1.005–1.030)
pH: 7 (ref 5.0–8.0)

## 2022-01-11 LAB — CBC
HCT: 29.7 % — ABNORMAL LOW (ref 39.0–52.0)
Hemoglobin: 10 g/dL — ABNORMAL LOW (ref 13.0–17.0)
MCH: 30.4 pg (ref 26.0–34.0)
MCHC: 33.7 g/dL (ref 30.0–36.0)
MCV: 90.3 fL (ref 80.0–100.0)
Platelets: 174 10*3/uL (ref 150–400)
RBC: 3.29 MIL/uL — ABNORMAL LOW (ref 4.22–5.81)
RDW: 12.6 % (ref 11.5–15.5)
WBC: 6.4 10*3/uL (ref 4.0–10.5)
nRBC: 0 % (ref 0.0–0.2)

## 2022-01-11 LAB — HEMOGLOBIN A1C
Hgb A1c MFr Bld: 7.6 % — ABNORMAL HIGH (ref 4.8–5.6)
Mean Plasma Glucose: 171.42 mg/dL

## 2022-01-11 LAB — GLUCOSE, CAPILLARY: Glucose-Capillary: 189 mg/dL — ABNORMAL HIGH (ref 70–99)

## 2022-01-11 LAB — LIPASE, BLOOD: Lipase: 27 U/L (ref 11–51)

## 2022-01-11 LAB — PHOSPHORUS: Phosphorus: 5.5 mg/dL — ABNORMAL HIGH (ref 2.5–4.6)

## 2022-01-11 LAB — HEPATITIS B SURFACE ANTIGEN: Hepatitis B Surface Ag: NONREACTIVE

## 2022-01-11 LAB — C-REACTIVE PROTEIN: CRP: 4.2 mg/dL — ABNORMAL HIGH (ref <1.0)

## 2022-01-11 LAB — HEPATITIS B SURFACE ANTIBODY,QUALITATIVE: Hep B S Ab: REACTIVE — AB

## 2022-01-11 LAB — PROTIME-INR
INR: 1.2 (ref 0.8–1.2)
Prothrombin Time: 14.7 seconds (ref 11.4–15.2)

## 2022-01-11 LAB — APTT: aPTT: 32 seconds (ref 24–36)

## 2022-01-11 LAB — TROPONIN I (HIGH SENSITIVITY): Troponin I (High Sensitivity): 15 ng/L (ref <18)

## 2022-01-11 MED ORDER — ONDANSETRON HCL 4 MG PO TABS: 4.0000 mg | ORAL_TABLET | Freq: Four times a day (QID) | ORAL | Status: DC | PRN

## 2022-01-11 MED ORDER — INSULIN ASPART 100 UNIT/ML IJ SOLN
0.0000 [IU] | Freq: Three times a day (TID) | INTRAMUSCULAR | Status: DC
  Administered 2022-01-12: 3 [IU] via SUBCUTANEOUS
  Administered 2022-01-13: 1 [IU] via SUBCUTANEOUS
  Administered 2022-01-13 – 2022-01-14 (×2): 3 [IU] via SUBCUTANEOUS
  Administered 2022-01-14: 2 [IU] via SUBCUTANEOUS

## 2022-01-11 MED ORDER — ONDANSETRON 4 MG PO TBDP
4.0000 mg | ORAL_TABLET | Freq: Once | ORAL | Status: AC
Start: 2022-01-11 — End: 2022-01-11
  Administered 2022-01-11: 4 mg via ORAL
  Filled 2022-01-11: qty 1

## 2022-01-11 MED ORDER — LIDOCAINE VISCOUS HCL 2 % MT SOLN: 15.0000 mL | Freq: Once | OROMUCOSAL | Status: DC

## 2022-01-11 MED ORDER — LORATADINE 10 MG PO TABS
10.0000 mg | ORAL_TABLET | Freq: Every day | ORAL | Status: DC
  Administered 2022-01-12 – 2022-01-14 (×3): 10 mg via ORAL
  Filled 2022-01-11 (×3): qty 1

## 2022-01-11 MED ORDER — AMLODIPINE BESYLATE 5 MG PO TABS: 5.0000 mg | ORAL_TABLET | Freq: Every day | ORAL | Status: DC

## 2022-01-11 MED ORDER — TAMSULOSIN HCL 0.4 MG PO CAPS
0.4000 mg | ORAL_CAPSULE | Freq: Every day | ORAL | Status: DC
Start: 2022-01-12 — End: 2022-01-14
  Administered 2022-01-12 – 2022-01-14 (×3): 0.4 mg via ORAL
  Filled 2022-01-11 (×3): qty 1

## 2022-01-11 MED ORDER — BARIUM SULFATE 2 % PO SUSP
ORAL | Status: AC
  Filled 2022-01-11: qty 2

## 2022-01-11 MED ORDER — OMEGA-3-ACID ETHYL ESTERS 1 G PO CAPS
2.0000 g | ORAL_CAPSULE | Freq: Two times a day (BID) | ORAL | Status: DC
  Administered 2022-01-11 – 2022-01-14 (×6): 2 g via ORAL
  Filled 2022-01-11 (×7): qty 2

## 2022-01-11 MED ORDER — HYDRALAZINE HCL 25 MG PO TABS: 25.0000 mg | ORAL_TABLET | Freq: Four times a day (QID) | ORAL | Status: DC | PRN

## 2022-01-11 MED ORDER — HEPARIN (PORCINE) 25000 UT/250ML-% IV SOLN
2250.0000 [IU]/h | INTRAVENOUS | Status: DC
  Administered 2022-01-12: 1850 [IU]/h via INTRAVENOUS
  Administered 2022-01-12: 1500 [IU]/h via INTRAVENOUS
  Administered 2022-01-13: 2250 [IU]/h via INTRAVENOUS
  Filled 2022-01-11 (×4): qty 250

## 2022-01-11 MED ORDER — INSULIN ASPART 100 UNIT/ML IJ SOLN
4.0000 [IU] | Freq: Three times a day (TID) | INTRAMUSCULAR | Status: DC
  Administered 2022-01-12 – 2022-01-14 (×7): 4 [IU] via SUBCUTANEOUS

## 2022-01-11 MED ORDER — INSULIN LISPRO (1 UNIT DIAL) 100 UNIT/ML (KWIKPEN): 4.0000 [IU] | PEN_INJECTOR | Freq: Three times a day (TID) | SUBCUTANEOUS | Status: DC

## 2022-01-11 MED ORDER — SODIUM CHLORIDE 0.9 % IV SOLN
1.0000 g | INTRAVENOUS | Status: DC
  Filled 2022-01-11: qty 10

## 2022-01-11 MED ORDER — SODIUM CHLORIDE 0.9 % IV SOLN
2.0000 g | Freq: Once | INTRAVENOUS | Status: AC
  Administered 2022-01-11: 2 g via INTRAVENOUS
  Filled 2022-01-11: qty 12.5

## 2022-01-11 MED ORDER — FENTANYL 50 MCG/HR TD PT72
1.0000 | MEDICATED_PATCH | TRANSDERMAL | Status: DC
  Administered 2022-01-13: 1 via TRANSDERMAL
  Filled 2022-01-11: qty 1

## 2022-01-11 MED ORDER — VANCOMYCIN VARIABLE DOSE PER UNSTABLE RENAL FUNCTION (PHARMACIST DOSING): Status: DC

## 2022-01-11 MED ORDER — DOXERCALCIFEROL 4 MCG/2ML IV SOLN
2.0000 ug | INTRAVENOUS | Status: DC
  Administered 2022-01-13: 2 ug via INTRAVENOUS
  Filled 2022-01-11 (×2): qty 2

## 2022-01-11 MED ORDER — PREGABALIN 100 MG PO CAPS
100.0000 mg | ORAL_CAPSULE | Freq: Two times a day (BID) | ORAL | Status: DC
  Administered 2022-01-11 – 2022-01-14 (×6): 100 mg via ORAL
  Filled 2022-01-11 (×6): qty 1

## 2022-01-11 MED ORDER — CHLORHEXIDINE GLUCONATE CLOTH 2 % EX PADS
6.0000 | MEDICATED_PAD | Freq: Every day | CUTANEOUS | Status: DC
  Administered 2022-01-12: 6 via TOPICAL

## 2022-01-11 MED ORDER — PANTOPRAZOLE SODIUM 40 MG PO TBEC
40.0000 mg | DELAYED_RELEASE_TABLET | Freq: Every day | ORAL | Status: DC
Start: 2022-01-12 — End: 2022-01-13
  Administered 2022-01-12 – 2022-01-13 (×2): 40 mg via ORAL
  Filled 2022-01-11 (×2): qty 1

## 2022-01-11 MED ORDER — FAMOTIDINE 20 MG PO TABS
20.0000 mg | ORAL_TABLET | Freq: Every day | ORAL | Status: DC
  Administered 2022-01-12 – 2022-01-13 (×2): 20 mg via ORAL
  Filled 2022-01-11 (×2): qty 1

## 2022-01-11 MED ORDER — TAMSULOSIN HCL 0.4 MG PO CAPS: 0.4000 mg | ORAL_CAPSULE | Freq: Every day | ORAL | Status: DC

## 2022-01-11 MED ORDER — LORATADINE 10 MG PO TABS: 10.0000 mg | ORAL_TABLET | Freq: Every day | ORAL | Status: DC

## 2022-01-11 MED ORDER — INSULIN GLARGINE-YFGN 100 UNIT/ML ~~LOC~~ SOLN
10.0000 [IU] | Freq: Every day | SUBCUTANEOUS | Status: DC
  Administered 2022-01-12 – 2022-01-13 (×3): 10 [IU] via SUBCUTANEOUS
  Filled 2022-01-11 (×4): qty 0.1

## 2022-01-11 MED ORDER — FOLIC ACID 1 MG PO TABS
1.0000 mg | ORAL_TABLET | Freq: Every day | ORAL | Status: DC
Start: 2022-01-11 — End: 2022-01-11

## 2022-01-11 MED ORDER — OXYCODONE-ACETAMINOPHEN 7.5-325 MG PO TABS
1.0000 | ORAL_TABLET | Freq: Three times a day (TID) | ORAL | Status: DC | PRN
  Administered 2022-01-11 – 2022-01-14 (×8): 1 via ORAL
  Filled 2022-01-11 (×8): qty 1

## 2022-01-11 MED ORDER — VANCOMYCIN HCL 10 G IV SOLR
2250.0000 mg | Freq: Once | INTRAVENOUS | Status: DC
  Filled 2022-01-11 (×4): qty 22.5

## 2022-01-11 MED ORDER — PANTOPRAZOLE SODIUM 40 MG PO TBEC: 40.0000 mg | DELAYED_RELEASE_TABLET | Freq: Every day | ORAL | Status: DC

## 2022-01-11 MED ORDER — AMIODARONE HCL 200 MG PO TABS: 200.0000 mg | ORAL_TABLET | Freq: Every day | ORAL | Status: DC

## 2022-01-11 MED ORDER — ONDANSETRON HCL 4 MG/2ML IJ SOLN
4.0000 mg | Freq: Four times a day (QID) | INTRAMUSCULAR | Status: DC | PRN
  Administered 2022-01-12: 4 mg via INTRAVENOUS
  Filled 2022-01-11: qty 2

## 2022-01-11 MED ORDER — FAMOTIDINE 20 MG PO TABS: 20.0000 mg | ORAL_TABLET | Freq: Every day | ORAL | Status: DC

## 2022-01-11 MED ORDER — VANCOMYCIN HCL IN DEXTROSE 1-5 GM/200ML-% IV SOLN: 1000.0000 mg | INTRAVENOUS | Status: DC

## 2022-01-11 MED ORDER — ALUM & MAG HYDROXIDE-SIMETH 200-200-20 MG/5ML PO SUSP
30.0000 mL | Freq: Once | ORAL | Status: DC
Start: 2022-01-11 — End: 2022-01-13

## 2022-01-11 MED ORDER — FOLIC ACID 1 MG PO TABS
1.0000 mg | ORAL_TABLET | Freq: Every day | ORAL | Status: DC
  Administered 2022-01-12 – 2022-01-14 (×3): 1 mg via ORAL
  Filled 2022-01-11 (×3): qty 1

## 2022-01-11 MED ORDER — ROPINIROLE HCL 0.5 MG PO TABS
2.0000 mg | ORAL_TABLET | Freq: Every day | ORAL | Status: DC
Start: 2022-01-11 — End: 2022-01-14
  Administered 2022-01-11 – 2022-01-13 (×3): 2 mg via ORAL
  Filled 2022-01-11 (×3): qty 4

## 2022-01-11 MED ORDER — MIDODRINE HCL 5 MG PO TABS: 10.0000 mg | ORAL_TABLET | ORAL | Status: DC

## 2022-01-11 MED ORDER — AMLODIPINE BESYLATE 5 MG PO TABS
5.0000 mg | ORAL_TABLET | Freq: Every day | ORAL | Status: DC
  Administered 2022-01-12 – 2022-01-14 (×2): 5 mg via ORAL
  Filled 2022-01-11 (×3): qty 1

## 2022-01-11 MED ORDER — AMIODARONE HCL 200 MG PO TABS
200.0000 mg | ORAL_TABLET | Freq: Every day | ORAL | Status: DC
  Administered 2022-01-12 – 2022-01-14 (×3): 200 mg via ORAL
  Filled 2022-01-11 (×3): qty 1

## 2022-01-11 MED ORDER — SODIUM CHLORIDE 0.9 % IV SOLN: 2.0000 g | Freq: Once | INTRAVENOUS | Status: DC

## 2022-01-11 MED ORDER — ATORVASTATIN CALCIUM 40 MG PO TABS
40.0000 mg | ORAL_TABLET | Freq: Every day | ORAL | Status: DC
  Administered 2022-01-11 – 2022-01-13 (×3): 40 mg via ORAL
  Filled 2022-01-11 (×3): qty 1

## 2022-01-11 MED ORDER — VANCOMYCIN HCL 10 G IV SOLR
2250.0000 mg | Freq: Once | INTRAVENOUS | Status: AC
  Administered 2022-01-12: 2250 mg via INTRAVENOUS
  Filled 2022-01-11: qty 22.5

## 2022-01-11 MED ORDER — LINACLOTIDE 145 MCG PO CAPS: 145.0000 ug | ORAL_CAPSULE | Freq: Every day | ORAL | Status: DC | PRN

## 2022-01-11 NOTE — H&P (Signed)
?History and Physical  ? ? [name] UUV:[id number] DOB: 31-May-1956 DOA: 01/11/2022 ? ?PCP: [name], PA-C (Confirm with patient/family/NH records and if not entered, this has to be entered at [facility] point of entry) ?Patient coming from: Home ? ?I have personally briefly reviewed patient's old medical records in [place] ? ?Chief Complaint: Feeling sick ? ?HPI: [name] is a 66 y.o. male with medical history significant of chronic left foot ischemic ulcer infection on chronic doxycycline, PVD status post stenting and ballooning and status post right BKA, ESRD on HD TTS, IDDM, COPD, OSA not tolerating CPAP, HTN, HLD, chronic multijoint OA on narcotics, presented with worsening of nauseous vomiting. ? ?Symptoms started 5 days ago, patient woke up with new onset of feeling nausea and vomiting of stomach content, nonbilious nonbloody none coffee-ground like.  He also developed occasional cramping-like epigastric pain associated with nauseous.  Denies any diarrhea, no fever or chills.  Symptoms somewhat better on Sunday but then came back again on Monday, and patient unable to go to dialysis Tuesday because of the nauseous vomiting.  Yesterday, patient was able to eat some soup and drink some water, however this morning, patient woke up with nauseous vomiting again, vomited x1 with stomach content. ? ?Patient has a chronic left lateral foot ulcer, has been following with [name] from wound care and she has been on p.o. doxycycline for 2+ months for the chronic infection. ? ?ED Course: Blood pressure significant elevated, no tachycardia and afebrile. ? ?WBC 6.4, creatinine 7.5, BUN 58, glucose 317 ? ?Patient started on vancomycin and cefepime in the ED. ? ?Review of Systems: As per HPI otherwise 14 point review of systems negative.  ? ? ?Past Medical History:  ?Diagnosis Date  ? Carotid artery occlusion 11/10/10  ? LEFT CAROTID ENDARTERECTOMY  ? Chronic kidney disease   ? Complication of anesthesia   ? BP  WENT UP AT [facility] "  ? COPD (chronic obstructive pulmonary disease) ([place])   ? pt denies this dx as of 06/01/20 - no inhaler   ? Diabetes mellitus without complication ([place])   ? Diverticulitis   ? Diverticulosis of colon (without mention of hemorrhage)   ? DJD (degenerative joint disease)   ? knees/hands/feet/back/neck  ? Fatty liver   ? Full dentures   ? GERD (gastroesophageal reflux disease)   ? H/O hiatal hernia   ? History of blood transfusion   ? with a past surical procedure per patient 06/01/20  ? Hyperlipidemia   ? Hypertension   ? Neuromuscular disorder ([place])   ? peripheral neuropathy  ? Non-pressure chronic ulcer of other part of left foot limited to breakdown of skin ([place]) 11/12/2016  ? Osteomyelitis ([place])   ? left 5th metatarsal  ? PAD (peripheral artery disease) ([place])   ? Distal aortogram June 2012. Atherectomy left popliteal artery July 2012.   ? Pseudoclaudication 11/15/2018  ? Sleep apnea   ? pt denies this dx as of 06/01/20  ? Slurred speech   ? AS PER WIFE IN D/C NOTE 11/10/10  ? 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 Unstable angina ([place]) 09/16/2018  ? Wears glasses   ? ? ?Past Surgical History:  ?Procedure Laterality Date  ? ABDOMINAL AORTOGRAM W/LOWER EXTREMITY N/A 06/23/2021  ? Procedure: ABDOMINAL AORTOGRAM W/LOWER EXTREMITY;  Surgeon: Nigel Mormon, MD;  Location: [facility];  Service: Cardiovascular;  Laterality: N/A;  ? AMPUTATION  11/05/2011  ? Procedure: AMPUTATION RAY;  Surgeon: Wylene Simmer, MD;  Location: [facility];  Service: Orthopedics;  Laterality: Right;  Amputation of Right 4&5th Toes  ? AMPUTATION Left 11/26/2012  ? Procedure: AMPUTATION RAY;  Surgeon: Wylene Simmer, MD;  Location: [facility];  Service: Orthopedics;  Laterality: Left;  fourth ray amputation  ? AMPUTATION Right 08/27/2014  ? Procedure: Transmetatarsal Amputation;  Surgeon: Newt Minion, MD;  Location: [facility];  Service: Orthopedics;  Laterality: Right;  ? AMPUTATION Right 01/14/2015  ? Procedure: AMPUTATION BELOW KNEE;  Surgeon: Newt Minion, MD;  Location: [facility];  Service: Orthopedics;  Laterality: Right;  ? AMPUTATION Left 10/21/2015  ? Procedure: Left Foot 5th Ray Amputation;  Surgeon: Newt Minion, MD;  Location: [facility];  Service: Orthopedics;  Laterality: Left;  ? ANTERIOR FUSION CERVICAL SPINE  02/06/06  ? C4-5, C5-6, C6-7; SURGEON DR. MAX COHEN  ? AV FISTULA PLACEMENT Left 06/02/2020  ? Procedure: ARTERIOVENOUS (AV) FISTULA CREATION LEFT;  Surgeon: Waynetta Sandy, MD;  Location: [facility];  Service: Vascular;  Laterality: Left;  ? BACK SURGERY    ? x 3  ? BASCILIC VEIN TRANSPOSITION Left 07/21/2020  ? Procedure: LEFT UPPER ARM ATERIOVENOUS SUPERFISTULALIZATION;  Surgeon: Waynetta Sandy, MD;  Location: [facility];  Service: Vascular;  Laterality: Left;  ? BELOW KNEE LEG AMPUTATION Right   ? CARDIAC CATHETERIZATION  10/31/04  ? 2009  ? CAROTID ENDARTERECTOMY  11/10/10  ? CAROTID ENDARTERECTOMY Left 11/10/2010  ? Subtotal occlusion of left internal carotid artery with left hemispheric transient ischemic attacks.  ? CAROTID STENT    ? CARPAL TUNNEL RELEASE Right 10/21/2013  ? Procedure: RIGHT CARPAL TUNNEL RELEASE;  Surgeon: Wynonia Sours, MD;  Location: [facility];  Service: Orthopedics;  Laterality: Right;  ? CHOLECYSTECTOMY    ? COLON SURGERY    ? COLONOSCOPY    ? COLOSTOMY REVERSAL  05/21/2018  ? ileostomy reversal  ? CYSTOSCOPY WITH STENT PLACEMENT Bilateral 01/13/2018  ? Procedure: CYSTOSCOPY WITH BILATERAL URETERAL CATHETER PLACEMENT;  Surgeon: Ardis Hughs, MD;  [facility]: [facility];  Service: Urology;  Laterality: Bilateral;  ? ESOPHAGEAL MANOMETRY Bilateral 07/19/2014  ? Procedure: ESOPHAGEAL MANOMETRY (EM);  Surgeon: Jerene Bears, MD;  [facility];  Service: Gastroenterology;  Laterality: Bilateral;  ? EYE SURGERY Bilateral 2020  ? cataract  ? FEMORAL ARTERY STENT    ? x6  ? FINGER SURGERY    ? FOOT SURGERY  04/25/2016  ?  EXCISION BASE 5TH METATARSAL AND PARTIAL CUBOID LEFT FOOT  ? HERNIA REPAIR    ?  LEFT INGUINAL AND UMBILICAL REPAIRS  ? HERNIA REPAIR    ? I & D EXTREMITY Left 04/25/2016  ? Procedure: EXCISION BASE 5TH METATARSAL AND PARTIAL CUBOID LEFT FOOT;  Surgeon: Newt Minion, MD;  Location: [facility];  Service: Orthopedics;  Laterality: Left;  ? ILEOSTOMY  01/13/2018  ? Procedure: ILEOSTOMY;  Surgeon: Clovis Riley, MD;  Location[facility];  Service: General;;  ? ILEOSTOMY CLOSURE N/A 05/21/2018  ? Procedure: ILEOSTOMY REVERSAL ERAS PATHWAY;  Surgeon: Clovis Riley, MD;  Location: [facility];  Service: General;  Laterality: N/A;  ? IR RADIOLOGIST EVAL & MGMT  11/19/2017  ? IR RADIOLOGIST EVAL & MGMT  12/03/2017  ? IR RADIOLOGIST EVAL & MGMT  12/18/2017  ? JOINT REPLACEMENT Right 2001  ? Total knee  ? LAMINECTOMY    ? X 3 LUMBAR AND X 2 CERVICAL SPINE OPERATIONS  ? LAPAROSCOPIC CHOLECYSTECTOMY W/ CHOLANGIOGRAPHY  11/09/04  ? SURGEON DR.  Sammuel Hines TOTH  ? LEFT HEART CATH AND CORONARY ANGIOGRAPHY N/A 09/16/2018  ? Procedure: LEFT HEART CATH AND CORONARY ANGIOGRAPHY;  Surgeon: Nigel Mormon, MD;  Location: [facility];  Service: Cardiovascular;  Laterality: N/A;  ? LEFT HEART CATHETERIZATION WITH CORONARY ANGIOGRAM N/A 10/29/2014  ? Procedure: LEFT HEART CATHETERIZATION WITH CORONARY ANGIOGRAM;  Surgeon: Laverda Page, MD;  Location: [facility];  Service: Cardiovascular;  Laterality: N/A;  ? LIGATION OF COMPETING BRANCHES OF ARTERIOVENOUS FISTULA Left 07/21/2020  ? Procedure: LIGATION OF COMPETING BRANCHES OF LEFT UPPER ARM ARTERIOVENOUS FISTULA;  Surgeon: Waynetta Sandy, MD;  Location: [facility];  Service: Vascular;  Laterality: Left;  ? LOWER EXTREMITY ANGIOGRAM N/A 03/19/2012  ? Procedure: LOWER EXTREMITY ANGIOGRAM;  Surgeon: Burnell Blanks, MD;  Location: [facility];  Service: Cardiovascular;  Laterality: N/A;  ? LOWER EXTREMITY ANGIOGRAPHY N/A 06/20/2021  ? Procedure: LOWER EXTREMITY ANGIOGRAPHY;  Surgeon: Nigel Mormon, MD;  Location: [facility];  Service:  Cardiovascular;  Laterality: N/A;  ? NECK SURGERY    ? PARTIAL COLECTOMY N/A 01/13/2018  ? Procedure: LAPAROSCOPIC ASSISTED   SIGMOID COLECTOMY ILEOSTOMY;  Surgeon: Clovis Riley, MD;  Location: [facility];  Service

## 2022-01-11 NOTE — Progress Notes (Signed)
Cardiology consultation appreciated, MRI of left foot ordered. ?

## 2022-01-11 NOTE — ED Triage Notes (Signed)
EMS stated, chest pain with N/V . Has missed dialysis x 2 . Started on Sunday, also has a wound on left foot.  ?

## 2022-01-11 NOTE — ED Notes (Signed)
Called lab to have A1c added to previous collection ?

## 2022-01-11 NOTE — ED Notes (Signed)
;  renal pt hard time voiding on demand, pt has label specimen cup  ?

## 2022-01-11 NOTE — ED Provider Notes (Signed)
?[facility] ?Provider Note ? ?CSN: [id number] ?Arrival date & time: 01/11/22 0830 ? ?Chief Complaint(s) ?Chest Pain, Nausea, and Emesis ? ?HPI ?[name] is a 66 y.o. male with PMH ESRD on hemodialysis Tuesday Thursday Saturday, chronic diastolic CHF, paroxysmal A-fib, T2DM, severe PAD status post right BKA and multiple irritated toes on the left with a known diabetic foot ulcer on the left who presents the emergency department for evaluation of multiple complaints including nausea, vomiting, chills, wound and chest pain.  Patient states over the last week he has had persistent nausea and vomiting every day and was unable to complete 2 days ago due to persistent vomiting.  He states that [name] is his current foot doctor and they are concerned for a deteriorating foot ulcer.  He currently endorses mild epigastric pain, a burning chest pain that began after vomiting but denies shortness of breath, headache, cough or other systemic symptoms.  As patient was due for dialysis today, he currently has missed 2 sessions. ? ? ?Past Medical History ?Past Medical History:  ?Diagnosis Date  ? Carotid artery occlusion 11/10/10  ? LEFT CAROTID ENDARTERECTOMY  ? Chronic kidney disease   ? Complication of anesthesia   ? BP WENT UP AT [facility] "  ? COPD (chronic obstructive pulmonary disease) ([place])   ? pt denies this dx as of 06/01/20 - no inhaler   ? Diabetes mellitus without complication ([place])   ? Diverticulitis   ? Diverticulosis of colon (without mention of hemorrhage)   ? DJD (degenerative joint disease)   ? knees/hands/feet/back/neck  ? Fatty liver   ? Full dentures   ? GERD (gastroesophageal reflux disease)   ? H/O hiatal hernia   ? History of blood transfusion   ? with a past surical procedure per patient 06/01/20  ? Hyperlipidemia   ? Hypertension   ? Neuromuscular disorder ([place])   ? peripheral neuropathy  ? Non-pressure chronic ulcer of other part of left foot limited to breakdown of  skin ([place]) 11/12/2016  ? Osteomyelitis ([place])   ? left 5th metatarsal  ? PAD (peripheral artery disease) ([place])   ? Distal aortogram June 2012. Atherectomy left popliteal artery July 2012.   ? Pseudoclaudication 11/15/2018  ? Sleep apnea   ? pt denies this dx as of 06/01/20  ? Slurred speech   ? AS PER WIFE IN D/C NOTE 11/10/10  ? 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 Unstable angina ([place]) 09/16/2018  ? Wears glasses   ? ?Patient Active Problem List  ? Diagnosis Date Noted  ? Proliferative diabetic retinopathy of left eye with macular edema associated with type 2 diabetes mellitus ([place]) 11/27/2021  ? Vitreous hemorrhage of right eye ([place]) 11/27/2021  ? Acute metabolic encephalopathy 46/96/2952  ? COVID 09/02/2021  ? Sepsis ([place]) 09/02/2021  ? Severe sepsis with lactic acidosis ([place]) 06/14/2021  ? Infection associated with implanted penile prosthesis ([place]) 06/14/2021  ? ESRD on hemodialysis ([place]) 06/14/2021  ? Paroxysmal atrial fibrillation with RVR ([place]) 06/14/2021  ? Carotid stenosis 06/15/2020  ? Dyslipidemia 06/15/2020  ? Diabetic macular edema of right eye with proliferative retinopathy associated with type 2 diabetes mellitus ([place]) 01/18/2020  ? Severe nonproliferative diabetic retinopathy of left eye, with macular edema, associated with type 2 diabetes mellitus ([place]) 01/04/2020  ? Retinal hemorrhage of right eye 01/04/2020  ? 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 Pseudoclaudication 11/15/2018  ? Essential hypertension 09/18/2018  ? S/P BKA (below knee amputation), right ([employer]) 09/18/2018  ?  Amputation of toe of left foot ([place]) 09/18/2018  ? Peripheral artery disease ([place]) 09/18/2018  ? S/P carotid endarterectomy 09/18/2018  ? OSA on CPAP 09/18/2018  ? Chronic back pain 09/18/2018  ? Status post reversal of ileostomy 05/21/2018  ? Normocytic anemia 02/14/2018  ? Intra-abdominal abscess ([place]) 11/04/2017  ? Chronic venous hypertension (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 ([place]) 12/11/2016  ? Ischemic ulcer  of left foot, limited to breakdown of skin ([place]) 11/12/2016  ? Unilateral primary osteoarthritis, left knee 10/11/2016  ? History of right below knee amputation ([place]) 07/12/2016  ? Acute osteomyelitis of left foot ([place])   ? Diabetic foot infection ([place]) 05/04/2016  ? Insulin dependent type 2 diabetes mellitus ([place]) 12/16/2015  ? Amputated toe ([place]) 10/21/2015  ? Leg edema, left 09/03/2015  ? CAD (dz of distal, mid and proximal RCA with implantation of 3 overlapping drug-eluting stent,) 09/03/2015  ? Chronic diastolic heart failure, NYHA class 2 ([place]) 09/03/2015  ? Angina pectoris associated with type 2 diabetes mellitus ([place]) 10/28/2014  ? Hyperlipidemia 08/25/2014  ? Limb pain 03/20/2013  ? DJD (degenerative joint disease) 09/25/2012  ? Migraine 09/25/2012  ? Neuropathy 09/25/2012  ? Restless legs syndrome (RLS) 09/25/2012  ? Chronic obstructive pulmonary disease, unspecified ([place]) 04/25/2012  ? Unknown cause of morbidity or mortality 04/25/2012  ? Chronic total occlusion of artery of the extremities ([place]) 04/08/2012  ? Onychomycosis 02/01/2012  ? Occlusion and stenosis of carotid artery without mention of cerebral infarction 06/12/2011  ? GERD (gastroesophageal reflux disease) 05/08/2011  ? Barrett's esophagus without dysplasia 05/08/2011  ? Former tobacco use 02/01/2011  ? ?Home Medication(s) ?Prior to Admission medications   ?Medication Sig Start Date End Date Taking? Authorizing Provider  ?amiodarone (PACERONE) 200 MG tablet Take 1 tablet (200 mg total) by mouth daily. Two (2) tablets for 10 days then one tab daily 09/07/21   Adrian Prows, MD  ?amLODipine (NORVASC) 5 MG tablet Take 5 mg by mouth daily. 01/30/21   [provider]  ?apixaban (ELIQUIS) 5 MG TABS tablet Take 1 tablet (5 mg total) by mouth 2 (two) times daily. 09/07/21 10/25/21  Adrian Prows, MD  ?atorvastatin (LIPITOR) 40 MG tablet Take 40 mg by mouth at bedtime.     [provider]  ?B Complex-C-Zn-Folic Acid (DIALYVITE/ZINC) TABS Take  1 tablet by mouth daily. 10/04/21   [provider]  ?doxycycline (VIBRA-TABS) 100 MG tablet TAKE 1 TABLET BY MOUTH TWICE A DAY 01/08/22   Newt Minion, MD  ?famotidine (PEPCID) 20 MG tablet Take 1 tablet (20 mg total) by mouth daily. 09/05/21   Sharen Hones, MD  ?fentaNYL (DURAGESIC) 50 MCG/HR Place 1 patch onto the skin every 3 (three) days. 09/05/21   Sharen Hones, MD  ?folic acid (FOLVITE) 1 MG tablet Take 1 mg by mouth daily. 09/18/21   [provider]  ?folic acid-vitamin b complex-vitamin c-selenium-zinc (DIALYVITE) 3 MG TABS tablet Take 1 tablet by mouth daily.    [provider]  ?Glucosamine HCl (GLUCOSAMINE PO) Take 1 tablet by mouth 2 (two) times daily.    [provider]  ?HUMALOG KWIKPEN 100 UNIT/ML KwikPen Inject 4 Units into the skin 3 (three) times daily. 09/05/21   Sharen Hones, MD  ?insulin glargine (LANTUS) 100 UNIT/ML injection Inject 0.1 mLs (10 Units total) into the skin at bedtime. ?Patient taking differently: Inject 20 Units into the skin at bedtime. 06/30/21   Antonieta Pert, MD  ?ketoconazole (NIZORAL) 2 % cream Apply  1 application topically 2 (two) times daily as needed for irritation.  03/07/18   [provider]  ?lidocaine-prilocaine (EMLA) cream Apply 1 application topically See admin instructions. Prior to Dialysis days Tuesday,Thursday and saturday 06/06/21   [provider]  ?linaclotide Rolan Lipa) 145 MCG CAPS capsule Take 145 mcg by mouth daily as needed (constipation).     [provider]  ?loratadine (CLARITIN) 10 MG tablet Take 10 mg by mouth daily.    [provider]  ?midodrine (PROAMATINE) 10 MG tablet Take 1 tablet (10 mg total) by mouth every other day. One hour before dialysis 09/07/21   Barb Merino, MD  ?NEEDLE, REUSABLE, 22 G 22G X 1-1/2" MISC 1 Units by Does not apply route as directed. For B12 IM inj 02/15/18   Elgergawy, Silver Huguenin, MD  ?nitroGLYCERIN (NITRODUR - DOSED IN MG/24 HR) 0.2 mg/hr patch Place 1  patch (0.2 mg total) onto the skin daily. 06/12/21   Newt Minion, MD  ?omega-3 acid ethyl esters (LOVAZA) 1 g capsule Take 2 g by mouth 2 (two) times daily.    [provider]  ?ondansetron (ZO

## 2022-01-11 NOTE — Consult Note (Signed)
CARDIOLOGY CONSULT NOTE  ?Patient ID: ?[name] ?MRN: [id number] ?DOB/AGE: [birth year] 68 y.o. ? ?Admit date: 01/11/2022 ?Referring Physician  Niel Hummer, MD ?Primary Physician:  [name], PA-C ?Reason for Consultation  PAD, A. Fib ? ?Patient ID: [name], male    DOB: [birth year], 66 y.o.   MRN: [id number] ? ?Chief Complaint  ?Patient presents with  ? Chest Pain  ? Nausea  ? Emesis  ? ?HPI:   ? ?[name]  is a 66 y.o. Caucasian male patient with severe peripheral arterial disease and has history of right below-knee amputation and toe lamputations on the left, multiple peripheral interventions in the past, uncontrolled diabetes mellitus, ESRD, severe diabetic retinopathy, coronary artery disease angioplasty to the right coronary artery on 10/29/2014, right carotid endarterectomy in 2012 , OSA Unable to tolerate CPAP, chronic back pain and neck and back surgery in past and on chronic pain medications, paroxysmal atrial fibrillation on Eliquis, chronic left foot ulceration from severe PAD and DM. ? ?He presented today on 01/11/2022 to the emergency department stating that he has not been feeling well, feels nauseous, has been having worsening leg ulceration and also chest pain and epigastric pain. ? ?Patient states that he was so nauseous and not feeling well, has missed his dialysis.  Since being in the emergency room, patient states that he has not had any chest pain, he feels well, he is just worried that the left leg ulcer is getting worse.  He  ? ? ?Past Medical History:  ?Diagnosis Date  ? Carotid artery occlusion 11/10/10  ? LEFT CAROTID ENDARTERECTOMY  ? Chronic kidney disease   ? Complication of anesthesia   ? BP WENT UP AT [facility] "  ? COPD (chronic obstructive pulmonary disease) ([place])   ? pt denies this dx as of 06/01/20 - no inhaler   ? Diabetes mellitus without complication ([place])   ? Diverticulitis   ? Diverticulosis of colon (without mention of hemorrhage)   ? DJD (degenerative joint disease)   ?  knees/hands/feet/back/neck  ? Fatty liver   ? Full dentures   ? GERD (gastroesophageal reflux disease)   ? H/O hiatal hernia   ? History of blood transfusion   ? with a past surical procedure per patient 06/01/20  ? Hyperlipidemia   ? Hypertension   ? Neuromuscular disorder ([place])   ? peripheral neuropathy  ? Non-pressure chronic ulcer of other part of left foot limited to breakdown of skin ([place]) 11/12/2016  ? Osteomyelitis ([place])   ? left 5th metatarsal  ? PAD (peripheral artery disease) ([place])   ? Distal aortogram June 2012. Atherectomy left popliteal artery July 2012.   ? Pseudoclaudication 11/15/2018  ? Sleep apnea   ? pt denies this dx as of 06/01/20  ? Slurred speech   ? AS PER WIFE IN D/C NOTE 11/10/10  ? 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 Unstable angina ([place]) 09/16/2018  ? Wears glasses   ? ?Past Surgical History:  ?Procedure Laterality Date  ? ABDOMINAL AORTOGRAM W/LOWER EXTREMITY N/A 06/23/2021  ? Procedure: ABDOMINAL AORTOGRAM W/LOWER EXTREMITY;  Surgeon: Nigel Mormon, MD;  Location: [facility];  Service: Cardiovascular;  Laterality: N/A;  ? AMPUTATION  11/05/2011  ? Procedure: AMPUTATION RAY;  Surgeon: Wylene Simmer, MD;  Location: [facility];  Service: Orthopedics;  Laterality: Right;  Amputation of Right 4&5th Toes  ? AMPUTATION Left 11/26/2012  ? Procedure: AMPUTATION RAY;  Surgeon: Wylene Simmer, MD;  Location: [facility];  Service: Orthopedics;  Laterality: Left;  fourth ray amputation  ? AMPUTATION Right 08/27/2014  ? Procedure: Transmetatarsal Amputation;  Surgeon: Newt Minion, MD;  Location: [facility];  Service: Orthopedics;  Laterality: Right;  ? AMPUTATION Right 01/14/2015  ? Procedure: AMPUTATION BELOW KNEE;  Surgeon: Newt Minion, MD;  Location: [facility];  Service: Orthopedics;  Laterality: Right;  ? AMPUTATION Left 10/21/2015  ? Procedure: Left Foot 5th Ray Amputation;  Surgeon: Newt Minion, MD;  Location: [facility];  Service: Orthopedics;  Laterality: Left;  ? ANTERIOR FUSION CERVICAL SPINE  02/06/06  ? C4-5, C5-6,  C6-7; SURGEON DR. MAX COHEN  ? AV FISTULA PLACEMENT Left 06/02/2020  ? Procedure: ARTERIOVENOUS (AV) FISTULA CREATION LEFT;  Surgeon: Waynetta Sandy, MD;  Location: [facility];  Service: Vascular;  Laterality: Left;  ? BACK SURGERY    ? x 3  ? BASCILIC VEIN TRANSPOSITION Left 07/21/2020  ? Procedure: LEFT UPPER ARM ATERIOVENOUS SUPERFISTULALIZATION;  Surgeon: Waynetta Sandy, MD;  Location: [facility];  Service: Vascular;  Laterality: Left;  ? BELOW KNEE LEG AMPUTATION Right   ? CARDIAC CATHETERIZATION  10/31/04  ? 2009  ? CAROTID ENDARTERECTOMY  11/10/10  ? CAROTID ENDARTERECTOMY Left 11/10/2010  ? Subtotal occlusion of left internal carotid artery with left hemispheric transient ischemic attacks.  ? CAROTID STENT    ? CARPAL TUNNEL RELEASE Right 10/21/2013  ? Procedure: RIGHT CARPAL TUNNEL RELEASE;  Surgeon: Wynonia Sours, MD;  Location: [facility];  Service: Orthopedics;  Laterality: Right;  ? CHOLECYSTECTOMY    ? COLON SURGERY    ? COLONOSCOPY    ? COLOSTOMY REVERSAL  05/21/2018  ? ileostomy reversal  ? CYSTOSCOPY WITH STENT PLACEMENT Bilateral 01/13/2018  ? Procedure: CYSTOSCOPY WITH BILATERAL URETERAL CATHETER PLACEMENT;  Surgeon: Ardis Hughs, MD;  Location[facility];  Service: Urology;  Laterality: Bilateral;  ? ESOPHAGEAL MANOMETRY Bilateral 07/19/2014  ? Procedure: ESOPHAGEAL MANOMETRY (EM);  Surgeon: Jerene Bears, MD;  [facility];  Service: Gastroenterology;  Laterality: Bilateral;  ? EYE SURGERY Bilateral 2020  ? cataract  ? FEMORAL ARTERY STENT    ? x6  ? FINGER SURGERY    ? FOOT SURGERY  04/25/2016  ?  EXCISION BASE 5TH METATARSAL AND PARTIAL CUBOID LEFT FOOT  ? HERNIA REPAIR    ? LEFT INGUINAL AND UMBILICAL REPAIRS  ? HERNIA REPAIR    ? I & D EXTREMITY Left 04/25/2016  ? Procedure: EXCISION BASE 5TH METATARSAL AND PARTIAL CUBOID LEFT FOOT;  Surgeon: Newt Minion, MD;  Location: [facility];  Service: Orthopedics;  Laterality: Left;  ? ILEOSTOMY  01/13/2018  ? Procedure:  ILEOSTOMY;  Surgeon: Clovis Riley, MD;  [facility];  Service: General;;  ? ILEOSTOMY CLOSURE N/A 05/21/2018  ? Procedure: ILEOSTOMY REVERSAL ERAS PATHWAY;  Surgeon: Clovis Riley, MD;  Location: [facility];  Service: General;  Laterality: N/A;  ? IR RADIOLOGIST EVAL & MGMT  11/19/2017  ? IR RADIOLOGIST EVAL & MGMT  12/03/2017  ? IR RADIOLOGIST EVAL & MGMT  12/18/2017  ? JOINT REPLACEMENT Right 2001  ? Total knee  ? LAMINECTOMY    ? X 3 LUMBAR AND X 2 CERVICAL SPINE OPERATIONS  ? LAPAROSCOPIC CHOLECYSTECTOMY W/ CHOLANGIOGRAPHY  11/09/04  ? SURGEON DR. Sammuel Hines TOTH  ? LEFT HEART CATH AND CORONARY ANGIOGRAPHY N/A 09/16/2018  ? Procedure: LEFT HEART CATH AND CORONARY ANGIOGRAPHY;  Surgeon: Nigel Mormon, MD;  Location: [facility];  Service: Cardiovascular;  Laterality: N/A;  ? LEFT HEART CATHETERIZATION WITH CORONARY ANGIOGRAM N/A 10/29/2014  ? Procedure: LEFT HEART CATHETERIZATION WITH CORONARY ANGIOGRAM;  Surgeon: Laverda Page, MD;  [facility];  Service: Cardiovascular;  Laterality: N/A;  ? LIGATION OF COMPETING BRANCHES OF ARTERIOVENOUS FISTULA Left 07/21/2020  ? Procedure: LIGATION OF COMPETING BRANCHES OF LEFT UPPER ARM ARTERIOVENOUS FISTULA;  Surgeon: Waynetta Sandy, MD;  Location: [facility];  Service: Vascular;  Laterality: Left;  ? LOWER EXTREMITY ANGIOGRAM N/A 03/19/2012  ? Procedure: LOWER EXTREMITY ANGIOGRAM;  Surgeon: Burnell Blanks, MD;  Location: [facility];  Service: Cardiovascular;  Laterality: N/A;  ? LOWER EXTREMITY ANGIOGRAPHY N/A 06/20/2021  ? Procedure: LOWER EXTREMITY ANGIOGRAPHY;  Surgeon: Nigel Mormon, MD;  Location: [facility];  Service: Cardiovascular;  Laterality: N/A;  ? NECK SURGERY    ? PARTIAL COLECTOMY N/A 01/13/2018  ? Procedure: LAPAROSCOPIC ASSISTED   SIGMOID COLECTOMY ILEOSTOMY;  Surgeon: Clovis Riley, MD;  [facility];  Service: General;  Laterality: N/A;  ? PENILE PROSTHESIS IMPLANT  08/14/05  ? INFRAPUBIC  INSERTION OF INFLATABLE PENILE PROSTHESIS; SURGEON DR. Amalia Hailey  ? PENILE PROSTHESIS IMPLANT    ? PERCUTANEOUS CORONARY STENT INTERVENTION (PCI-S) Right 10/29/2014  ? Procedure: PERCUTANEOUS CORONARY STENT INTERVENT

## 2022-01-11 NOTE — Progress Notes (Signed)
Pharmacy Antibiotic Note ? ?[name] is a 66 y.o. male admitted on 01/11/2022 presenting with CP, N/V and missing last 2 iHD sessions, DFI.  Pharmacy has been consulted for vancomycin and cefepime dosing.  ESRD-HD usually TTS ? ?Plan: ?Vancomycin 2250 mg IV x 1, then 1000 mg IV qHD ?Cefepime 2g IV x 1, then 1g IV q 24h ?Monitor HD schedule, Cx and ortho recs for foot ulcer ?Vancomycin random level as needed ? ? ?  ? ?Temp (24hrs), Avg:99.3 ?F (37.4 ?C), Min:98.6 ?F (37 ?C), Max:99.9 ?F (37.7 ?C) ? ?Recent Labs  ?Lab 01/11/22 ?2263  ?WBC 6.4  ?CREATININE 7.59*  ?  ?CrCl cannot be calculated (Unknown ideal weight.).   ? ?Allergies  ?Allergen Reactions  ? Contrast Media [Iodinated Contrast Media] Shortness Of Breath and Other (See Comments)  ?  Difficulty breathing and altered mental status ? ?  ? Ivp Dye [Iodinated Contrast Media] Anaphylaxis, Shortness Of Breath and Other (See Comments)  ?  Breathing problems, altered mental state ?  ? Adhesive [Tape] Rash and Other (See Comments)  ?  Rash after 1 day of use  ? Latex Rash and Other (See Comments)  ?  A severe rash appears after the first 24 hours of being placed  ? ? ?Bertis Ruddy, PharmD ?Clinical Pharmacist ?ED Pharmacist Phone # [phone] ?01/11/2022 2:05 PM ? ? ?

## 2022-01-11 NOTE — Progress Notes (Signed)
ANTICOAGULATION CONSULT NOTE - Initial Consult ? ?Pharmacy Consult for Heparin ?Indication: chest pain/ACS ? ?Allergies  ?Allergen Reactions  ? Contrast Media [Iodinated Contrast Media] Shortness Of Breath and Other (See Comments)  ?  Difficulty breathing and altered mental status ? ?  ? Ivp Dye [Iodinated Contrast Media] Anaphylaxis, Shortness Of Breath and Other (See Comments)  ?  Breathing problems, altered mental state ?  ? Adhesive [Tape] Rash and Other (See Comments)  ?  Rash after 1 day of use  ? Latex Rash and Other (See Comments)  ?  A severe rash appears after the first 24 hours of being placed  ? ? ?Patient Measurements: ?  ?Heparin Dosing Weight: 104 kg ? ?Vital Signs: ?Temp: 98.8 ?F (37.1 ?C) (05/04 1615) ?Temp Source: Oral (05/04 1615) ?BP: 182/78 (05/04 1615) ?Pulse Rate: 79 (05/04 1615) ? ?Labs: ?Recent Labs  ?  01/11/22 ?6073  ?HGB 10.0*  ?HCT 29.7*  ?PLT 174  ?CREATININE 7.59*  ?TROPONINIHS 15  ? ? ?CrCl cannot be calculated (Unknown ideal weight.). ? ? ?Medical History: ?Past Medical History:  ?Diagnosis Date  ? Carotid artery occlusion 11/10/10  ? LEFT CAROTID ENDARTERECTOMY  ? Chronic kidney disease   ? Complication of anesthesia   ? BP WENT UP AT [facility] "  ? COPD (chronic obstructive pulmonary disease) ([place])   ? pt denies this dx as of 06/01/20 - no inhaler   ? Diabetes mellitus without complication ([place])   ? Diverticulitis   ? Diverticulosis of colon (without mention of hemorrhage)   ? DJD (degenerative joint disease)   ? knees/hands/feet/back/neck  ? Fatty liver   ? Full dentures   ? GERD (gastroesophageal reflux disease)   ? H/O hiatal hernia   ? History of blood transfusion   ? with a past surical procedure per patient 06/01/20  ? Hyperlipidemia   ? Hypertension   ? Neuromuscular disorder ([place])   ? peripheral neuropathy  ? Non-pressure chronic ulcer of other part of left foot limited to breakdown of skin ([place]) 11/12/2016  ? Osteomyelitis ([place])   ? left 5th metatarsal  ? PAD (peripheral artery  disease) ([place])   ? Distal aortogram June 2012. Atherectomy left popliteal artery July 2012.   ? Pseudoclaudication 11/15/2018  ? Sleep apnea   ? pt denies this dx as of 06/01/20  ? Slurred speech   ? AS PER WIFE IN D/C NOTE 11/10/10  ? 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 Unstable angina ([place]) 09/16/2018  ? Wears glasses   ? ? ?Medications:  ?(Not in a hospital admission) ? ?Scheduled:  ? alum & mag hydroxide-simeth  30 mL Oral Once  ? And  ? lidocaine  15 mL Oral Once  ? [START ON 01/12/2022] amiodarone  200 mg Oral Daily  ? [START ON 01/12/2022] amLODipine  5 mg Oral Daily  ? atorvastatin  40 mg Oral QHS  ? barium      ? [START ON 01/12/2022] Chlorhexidine Gluconate Cloth  6 each Topical Q0600  ? [START ON 01/12/2022] famotidine  20 mg Oral Daily  ? fentaNYL  1 patch Transdermal Q72H  ? [START ON 03/10/625] folic acid  1 mg Oral Daily  ? insulin aspart  0-6 Units Subcutaneous TID WC  ? insulin aspart  4 Units Subcutaneous TID WC  ? insulin glargine-yfgn  10 Units Subcutaneous QHS  ? [START ON 01/12/2022] loratadine  10 mg Oral Daily  ? midodrine  10 mg Oral QODAY  ? omega-3 acid ethyl esters  2 g Oral BID  ? [START ON 01/12/2022] pantoprazole  40 mg Oral Daily  ? pregabalin  100 mg Oral BID  ? rOPINIRole  2 mg Oral QHS  ? [START ON 01/12/2022] tamsulosin  0.4 mg Oral Daily  ? ?Infusions:  ? [START ON 01/12/2022] ceFEPime (MAXIPIME) IV    ? ceFEPime (MAXIPIME) IV    ? vancomycin    ? [START ON 01/13/2022] vancomycin    ? ?PRN: hydrALAZINE, linaclotide, ondansetron **OR** ondansetron (ZOFRAN) IV, ondansetron, oxyCODONE-acetaminophen ? ?Assessment: ?27 yom with a history of severe PAD, rt BKA, left toe amputations, DM, ESRD on HD, diabetic retinopathy, CAD, OSA, chronic neck and back pain, AF on eliquis. Patient is presenting with chest pain, emesis, and nausea. Heparin per pharmacy consult placed for chest pain/ACS. ? ?Patient is on apixaban prior to arrival. Last dose 5/4 0800. Will require aPTT monitoring due to likely falsely high anti-Xa  level secondary to DOAC use. ? ?Hgb 10; plt 174 ? ?Goal of Therapy:  ?Heparin level 0.3-0.7 units/ml ?aPTT 66-102 seconds ?Monitor platelets by anticoagulation protocol: Yes ?  ?Plan:  ?No initial heparin bolus ?Start heparin infusion at 1500 units/hr at 8p today ?Check aPTT & anti-Xa level in 8 hours and daily while on heparin ?Continue to monitor via aPTT until levels are correlated ?Continue to monitor H&H and platelets ? ?Lorelei Pont, PharmD, BCPS ?01/11/2022 4:33 PM ?ED Clinical Pharmacist -  [phone] ? ? ? ?

## 2022-01-11 NOTE — Consult Note (Signed)
Renal Service ?Consult Note ?[facility] ? ?[name] ?01/11/2022 ?Harold Blazing, MD ?Requesting Physician: Dr. Matilde Sprang ? ?Reason for Consult: ESRD pt w/ N/V and chest pain ?HPI: The patient is a 66 y.o. year-old w/ hx of ESRD on HD, PAF, DM2, severe pad R BKA and DFU on left foot. Presented to ED w/ nausea, vomiting, chills, wounds and chest pain for the last 1-2 wks. Over last week has had persistent vomiting and had to cut short one of his HD sessions due to vomiting. In ED creat 7.6, BUN 50, K 4.0 WBC 6K , Hb 10, UA possibly dirty. CXR negative. +febrile in ED up to about 99.9deg F. Pt started on IV abx for possible sepsis. BP's were high normal. Asked to see for dialysis.  ? ?Pt missed HD on Tuesday, last HD was Sat 4/29.  No c/o's today, seen in HD.  ? ?ROS - denies CP, no joint pain, no HA, no blurry vision, no rash, no diarrhea, no nausea/ vomiting, no dysuria, no difficulty voiding ? ? ?Past Medical History  ?Past Medical History:  ?Diagnosis Date  ? Carotid artery occlusion 11/10/10  ? LEFT CAROTID ENDARTERECTOMY  ? Chronic kidney disease   ? Complication of anesthesia   ? BP WENT UP AT [facility] "  ? COPD (chronic obstructive pulmonary disease) ([place])   ? pt denies this dx as of 06/01/20 - no inhaler   ? Diabetes mellitus without complication ([place])   ? Diverticulitis   ? Diverticulosis of colon (without mention of hemorrhage)   ? DJD (degenerative joint disease)   ? knees/hands/feet/back/neck  ? Fatty liver   ? Full dentures   ? GERD (gastroesophageal reflux disease)   ? H/O hiatal hernia   ? History of blood transfusion   ? with a past surical procedure per patient 06/01/20  ? Hyperlipidemia   ? Hypertension   ? Neuromuscular disorder ([place])   ? peripheral neuropathy  ? Non-pressure chronic ulcer of other part of left foot limited to breakdown of skin ([place]) 11/12/2016  ? Osteomyelitis ([place])   ? left 5th metatarsal  ? PAD (peripheral artery disease) ([place])   ? Distal aortogram June 2012. Atherectomy  left popliteal artery July 2012.   ? Pseudoclaudication 11/15/2018  ? Sleep apnea   ? pt denies this dx as of 06/01/20  ? Slurred speech   ? AS PER WIFE IN D/C NOTE 11/10/10  ? 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 Unstable angina ([place]) 09/16/2018  ? Wears glasses   ? ?Past Surgical History  ?Past Surgical History:  ?Procedure Laterality Date  ? ABDOMINAL AORTOGRAM W/LOWER EXTREMITY N/A 06/23/2021  ? Procedure: ABDOMINAL AORTOGRAM W/LOWER EXTREMITY;  Surgeon: Nigel Mormon, MD;  Location: [place] [facility];  Service: Cardiovascular;  Laterality: N/A;  ? AMPUTATION  11/05/2011  ? Procedure: AMPUTATION RAY;  Surgeon: Wylene Simmer, MD;  Location: [place];  Service: Orthopedics;  Laterality: Right;  Amputation of Right 4&5th Toes  ? AMPUTATION Left 11/26/2012  ? Procedure: AMPUTATION RAY;  Surgeon: Wylene Simmer, MD;  Location: [place];  Service: Orthopedics;  Laterality: Left;  fourth ray amputation  ? AMPUTATION Right 08/27/2014  ? Procedure: Transmetatarsal Amputation;  Surgeon: Newt Minion, MD;  Location: [place];  Service: Orthopedics;  Laterality: Right;  ? AMPUTATION Right 01/14/2015  ? Procedure: AMPUTATION BELOW KNEE;  Surgeon: Newt Minion, MD;  Location: [place];  Service: Orthopedics;  Laterality: Right;  ? AMPUTATION Left 10/21/2015  ? Procedure:  Left Foot 5th Ray Amputation;  Surgeon: Newt Minion, MD;  Location: [place];  Service: Orthopedics;  Laterality: Left;  ? ANTERIOR FUSION CERVICAL SPINE  02/06/06  ? C4-5, C5-6, C6-7; SURGEON DR. MAX COHEN  ? AV FISTULA PLACEMENT Left 06/02/2020  ? Procedure: ARTERIOVENOUS (AV) FISTULA CREATION LEFT;  Surgeon: Waynetta Sandy, MD;  Location: [place];  Service: Vascular;  Laterality: Left;  ? BACK SURGERY    ? x 3  ? BASCILIC VEIN TRANSPOSITION Left 07/21/2020  ? Procedure: LEFT UPPER ARM ATERIOVENOUS SUPERFISTULALIZATION;  Surgeon: Waynetta Sandy, MD;  Location: [place];  Service: Vascular;  Laterality: Left;  ? BELOW KNEE LEG AMPUTATION Right   ? CARDIAC  CATHETERIZATION  10/31/04  ? 2009  ? CAROTID ENDARTERECTOMY  11/10/10  ? CAROTID ENDARTERECTOMY Left 11/10/2010  ? Subtotal occlusion of left internal carotid artery with left hemispheric transient ischemic attacks.  ? CAROTID STENT    ? CARPAL TUNNEL RELEASE Right 10/21/2013  ? Procedure: RIGHT CARPAL TUNNEL RELEASE;  Surgeon: Wynonia Sours, MD;  Location: [place];  Service: Orthopedics;  Laterality: Right;  ? CHOLECYSTECTOMY    ? COLON SURGERY    ? COLONOSCOPY    ? COLOSTOMY REVERSAL  05/21/2018  ? ileostomy reversal  ? CYSTOSCOPY WITH STENT PLACEMENT Bilateral 01/13/2018  ? Procedure: CYSTOSCOPY WITH BILATERAL URETERAL CATHETER PLACEMENT;  Surgeon: Ardis Hughs, MD;  Location: [facility];  Service: Urology;  Laterality: Bilateral;  ? ESOPHAGEAL MANOMETRY Bilateral 07/19/2014  ? Procedure: ESOPHAGEAL MANOMETRY (EM);  Surgeon: Jerene Bears, MD;  Location: [facility];  Service: Gastroenterology;  Laterality: Bilateral;  ? EYE SURGERY Bilateral 2020  ? cataract  ? FEMORAL ARTERY STENT    ? x6  ? FINGER SURGERY    ? FOOT SURGERY  04/25/2016  ?  EXCISION BASE 5TH METATARSAL AND PARTIAL CUBOID LEFT FOOT  ? HERNIA REPAIR    ? LEFT INGUINAL AND UMBILICAL REPAIRS  ? HERNIA REPAIR    ? I & D EXTREMITY Left 04/25/2016  ? Procedure: EXCISION BASE 5TH METATARSAL AND PARTIAL CUBOID LEFT FOOT;  Surgeon: Newt Minion, MD;  Location: [place];  Service: Orthopedics;  Laterality: Left;  ? ILEOSTOMY  01/13/2018  ? Procedure: ILEOSTOMY;  Surgeon: Clovis Riley, MD;  Location: [facility];  Service: General;;  ? ILEOSTOMY CLOSURE N/A 05/21/2018  ? Procedure: ILEOSTOMY REVERSAL ERAS PATHWAY;  Surgeon: Clovis Riley, MD;  Location: [place];  Service: General;  Laterality: N/A;  ? IR RADIOLOGIST EVAL & MGMT  11/19/2017  ? IR RADIOLOGIST EVAL & MGMT  12/03/2017  ? IR RADIOLOGIST EVAL & MGMT  12/18/2017  ? JOINT REPLACEMENT Right 2001  ? Total knee  ? LAMINECTOMY    ? X 3 LUMBAR AND X 2 CERVICAL SPINE OPERATIONS  ? LAPAROSCOPIC  CHOLECYSTECTOMY W/ CHOLANGIOGRAPHY  11/09/04  ? SURGEON DR. Sammuel Hines TOTH  ? LEFT HEART CATH AND CORONARY ANGIOGRAPHY N/A 09/16/2018  ? Procedure: LEFT HEART CATH AND CORONARY ANGIOGRAPHY;  Surgeon: Nigel Mormon, MD;  Location: [place] [facility];  Service: Cardiovascular;  Laterality: N/A;  ? LEFT HEART CATHETERIZATION WITH CORONARY ANGIOGRAM N/A 10/29/2014  ? Procedure: LEFT HEART CATHETERIZATION WITH CORONARY ANGIOGRAM;  Surgeon: Laverda Page, MD;  Location: [facility];  Service: Cardiovascular;  Laterality: N/A;  ? LIGATION OF COMPETING BRANCHES OF ARTERIOVENOUS FISTULA Left 07/21/2020  ? Procedure: LIGATION OF COMPETING BRANCHES OF LEFT UPPER ARM ARTERIOVENOUS FISTULA;  Surgeon: Waynetta Sandy,  MD;  Location: [place];  Service: Vascular;  Laterality: Left;  ? LOWER EXTREMITY ANGIOGRAM N/A 03/19/2012  ? Procedure: LOWER EXTREMITY ANGIOGRAM;  Surgeon: Burnell Blanks, MD;  Location: [facility];  Service: Cardiovascular;  Laterality: N/A;  ? LOWER EXTREMITY ANGIOGRAPHY N/A 06/20/2021  ? Procedure: LOWER EXTREMITY ANGIOGRAPHY;  Surgeon: Nigel Mormon, MD;  Location: [place] [facility];  Service: Cardiovascular;  Laterality: N/A;  ? NECK SURGERY    ? PARTIAL COLECTOMY N/A 01/13/2018  ? Procedure: LAPAROSCOPIC ASSISTED   SIGMOID COLECTOMY ILEOSTOMY;  Surgeon: Clovis Riley, MD;  Location: [facility];  Service: General;  Laterality: N/A;  ? PENILE PROSTHESIS IMPLANT  08/14/05  ? INFRAPUBIC INSERTION OF INFLATABLE PENILE PROSTHESIS; SURGEON DR. Amalia Hailey  ? PENILE PROSTHESIS IMPLANT    ? PERCUTANEOUS CORONARY STENT INTERVENTION (PCI-S) Right 10/29/2014  ? Procedure: PERCUTANEOUS CORONARY STENT INTERVENTION (PCI-S);  Surgeon: Laverda Page, MD;  Location: [facility] CATH LAB;  Service: Cardiovascular;  Laterality: Right;  ? PERIPHERAL VASCULAR INTERVENTION Left 06/23/2021  ? Procedure: PERIPHERAL VASCULAR INTERVENTION;  Surgeon: Nigel Mormon, MD;  Location: [place] [facility];  Service:  Cardiovascular;  Laterality: Left;  ? REMOVAL OF PENILE PROSTHESIS N/A 06/14/2021  ? Procedure: Removal of THREE piece inflatable penile prosthesis;  Surgeon: Lucas Mallow, MD;  Location: [place];  Service: Urology;  Lat

## 2022-01-12 ENCOUNTER — Other Ambulatory Visit: Payer: Self-pay

## 2022-01-12 ENCOUNTER — Inpatient Hospital Stay (HOSPITAL_COMMUNITY): Payer: 59

## 2022-01-12 DIAGNOSIS — E11628 Type 2 diabetes mellitus with other skin complications: Secondary | ICD-10-CM | POA: Diagnosis not present

## 2022-01-12 DIAGNOSIS — R1110 Vomiting, unspecified: Secondary | ICD-10-CM | POA: Diagnosis not present

## 2022-01-12 DIAGNOSIS — L089 Local infection of the skin and subcutaneous tissue, unspecified: Secondary | ICD-10-CM | POA: Diagnosis not present

## 2022-01-12 LAB — GLUCOSE, CAPILLARY
Glucose-Capillary: 142 mg/dL — ABNORMAL HIGH (ref 70–99)
Glucose-Capillary: 281 mg/dL — ABNORMAL HIGH (ref 70–99)
Glucose-Capillary: 123 mg/dL — ABNORMAL HIGH (ref 70–99)
Glucose-Capillary: 230 mg/dL — ABNORMAL HIGH (ref 70–99)

## 2022-01-12 LAB — BASIC METABOLIC PANEL
Anion gap: 13 (ref 5–15)
BUN: 24 mg/dL — ABNORMAL HIGH (ref 8–23)
CO2: 25 mmol/L (ref 22–32)
Calcium: 8.1 mg/dL — ABNORMAL LOW (ref 8.9–10.3)
Chloride: 98 mmol/L (ref 98–111)
Creatinine, Ser: 4.68 mg/dL — ABNORMAL HIGH (ref 0.61–1.24)
GFR, Estimated: 13 mL/min — ABNORMAL LOW (ref >60)
Glucose, Bld: 128 mg/dL — ABNORMAL HIGH (ref 70–99)
Potassium: 4.8 mmol/L (ref 3.5–5.1)
Sodium: 136 mmol/L (ref 135–145)

## 2022-01-12 LAB — MRSA NEXT GEN BY PCR, NASAL: MRSA by PCR Next Gen: NOT DETECTED

## 2022-01-12 LAB — APTT
aPTT: 51 seconds — ABNORMAL HIGH (ref 24–36)
aPTT: 50 seconds — ABNORMAL HIGH (ref 24–36)

## 2022-01-12 LAB — CBC
HCT: 37.1 % — ABNORMAL LOW (ref 39.0–52.0)
Hemoglobin: 12.5 g/dL — ABNORMAL LOW (ref 13.0–17.0)
MCH: 29.8 pg (ref 26.0–34.0)
MCHC: 33.7 g/dL (ref 30.0–36.0)
MCV: 88.5 fL (ref 80.0–100.0)
Platelets: 157 10*3/uL (ref 150–400)
RBC: 4.19 MIL/uL — ABNORMAL LOW (ref 4.22–5.81)
RDW: 12.7 % (ref 11.5–15.5)
WBC: 4.9 10*3/uL (ref 4.0–10.5)
nRBC: 0 % (ref 0.0–0.2)

## 2022-01-12 LAB — HEPATITIS B SURFACE ANTIBODY, QUANTITATIVE: Hep B S AB Quant (Post): 152.8 m[IU]/mL (ref >9.9)

## 2022-01-12 LAB — HEPARIN LEVEL (UNFRACTIONATED): Heparin Unfractionated: >1.1 IU/mL — ABNORMAL HIGH (ref 0.30–0.70)

## 2022-01-12 MED ORDER — VANCOMYCIN HCL IN DEXTROSE 1-5 GM/200ML-% IV SOLN
1000.0000 mg | INTRAVENOUS | Status: DC
  Administered 2022-01-13: 1000 mg via INTRAVENOUS
  Filled 2022-01-12 (×2): qty 200

## 2022-01-12 MED ORDER — SODIUM CHLORIDE 0.9 % IV SOLN
2.0000 g | INTRAVENOUS | Status: DC
  Administered 2022-01-13: 2 g via INTRAVENOUS
  Filled 2022-01-12: qty 12.5

## 2022-01-12 MED ORDER — MIDODRINE HCL 5 MG PO TABS
10.0000 mg | ORAL_TABLET | ORAL | Status: DC
  Filled 2022-01-12: qty 2

## 2022-01-12 NOTE — Progress Notes (Signed)
[facility] ?Progress Note ? ?Subjective: seen in room. Says the prosthetic weighs 2.6 kg.  ?Vitals:  ? 01/11/22 2030 01/11/22 2109 01/12/22 0013 01/12/22 0518  ?BP: (!) 149/67 (!) 169/120 (!) 170/57 (!) 139/52  ?Pulse: 81 91 72 (!) 59  ?Resp: '16  17 18  '$ ?Temp: 98.9 ?F (37.2 ?C) 99.7 ?F (37.6 ?C) 98.9 ?F (37.2 ?C) 98.5 ?F (36.9 ?C)  ?TempSrc: Oral Oral Oral Oral  ?SpO2: 100% 97% 99% 97%  ?Weight:  122.8 kg    ?Height:  '6\' 2"'$  (1.88 m)    ? ? ?Exam: ?Gen alert, no distress ?No jvd or bruits ?Chest clear bilat to bases ?RRR no RG ?Abd soft ntnd no mass or ascites +bs ?Ext 1+ LLE edema, R BKA w/ prosthetic in place ?Neuro is alert, Ox 3 , nf ?   LUE AVF +bruit ?  ?  ?  ?  ? Home meds include - amiodarone, norvasc 5, lipitor, pepcid, duragesic patch, humalog+ lantus insulin, midodrine 10 mg pre hd tiw, percocet, protonix, lyrica 100 bid, requip 2, flomax, prns/ vits/ supps ?  ?  ? CXR 5/04 - IMPRESSION: No appreciable airspace consolidation or pulmonary edema. ?  ? OP HD: TTS [place] ? 4h 91mn   110kg   450/800      2/2 bath  LUE AVF   Hep none ? - last HD 4/29, 117 > 115 kg ? - last Hb 11.4 on 4/25 ? - venofer weekly '50mg'$  ? - hectorol 2 ug tiw IV ?  ?  ?Assessment/ Plan: ?Sepsis/ osteo L foot - diab foot ulcer which failed OP Rx, started on IV vanc/ cefepime here. Ortho and cards (revasc) consulted.  ?Nausea/ vomiting - per pmd CT abd pend, starting H2B and PPI. Lft's and lipase ok ?ESRD - on HD TTS. Missed 1 op HD. Had HD here Thursday on schedule. Next HD tomorrow ?BP - on norvasc 5 + midodrine 10 pre HD tiw. BP's up at bit, added hold orders to midodrine  ?Volume - sig edema L leg. Up 13kg post HD, doubt accuracy (+ was wearing prosthetic leg). 3 L UF yest w/o BP drop. CXR neg. Max UF w/ HD tomorrow.  ?Anemia esrd - Hb 10 here, hold weekly IV Fe w/ infection. Transfuse prn. Not on esa at OP unit.  ?MBD ckd - CCa and phos in range. Cont IV vdra w/ hd. He isnot sure the name of his binder.  ?PAF - on  amio, eliquis switched to IV heparin here.  ?HTN - BP okay to high, getting home norvasc. Lower vol w/ HD.  ?Narcotic dependence - per pmd  ?  ? ? ? ?[name] ?01/12/2022, 7:05 AM ? ? ?Recent Labs  ?Lab 01/11/22 ?0741205/04/23 ?1638  ?HGB 10.0*  --   ?ALBUMIN 2.9*  --   ?CALCIUM 7.6*  --   ?PHOS  --  5.5*  ?CREATININE 7.59*  --   ?K 4.0  --   ? ?Inpatient medications: ? alum & mag hydroxide-simeth  30 mL Oral Once  ? And  ? lidocaine  15 mL Oral Once  ? amiodarone  200 mg Oral Daily  ? amLODipine  5 mg Oral Daily  ? atorvastatin  40 mg Oral QHS  ? Chlorhexidine Gluconate Cloth  6 each Topical Q0600  ? doxercalciferol  2 mcg Intravenous Q T,Th,Sa-HD  ? famotidine  20 mg Oral Daily  ? [START ON 01/13/2022] fentaNYL  1 patch Transdermal QI78M ? folic  acid  1 mg Oral Daily  ? insulin aspart  0-6 Units Subcutaneous TID WC  ? insulin aspart  4 Units Subcutaneous TID WC  ? insulin glargine-yfgn  10 Units Subcutaneous QHS  ? loratadine  10 mg Oral Daily  ? midodrine  10 mg Oral QODAY  ? omega-3 acid ethyl esters  2 g Oral BID  ? pantoprazole  40 mg Oral Daily  ? pregabalin  100 mg Oral BID  ? rOPINIRole  2 mg Oral QHS  ? tamsulosin  0.4 mg Oral Daily  ? vancomycin variable dose per unstable renal function (pharmacist dosing)   Does not apply See admin instructions  ? ? ceFEPime (MAXIPIME) IV    ? heparin 1,500 Units/hr (01/12/22 0036)  ? ?hydrALAZINE, linaclotide, ondansetron **OR** ondansetron (ZOFRAN) IV, ondansetron, oxyCODONE-acetaminophen ? ? ? ? ? ? ?

## 2022-01-12 NOTE — Progress Notes (Signed)
Patient ID: [name], male   DOB: 07/12/1956, 66 y.o.   MRN: [id number] ?Patient is status post revascularization to the left lower extremity status post ray amputation.  The lateral foot wound had previously completely healed and patient now has progressive ischemic breakdown.  Patient is felt to have adequate inline arterial circulation by cardiology.  MRI scan is pending.  Discussed with patient I will follow-up once the MRI is obtained to determine surgical treatment options or nonoperative options. ?

## 2022-01-12 NOTE — Progress Notes (Signed)
Subjective:  ?States that his nausea has improved.  He has not had any further chest pain.  No PND or orthopnea. ? ?Intake/Output from previous day: ? ?I/O last 3 completed shifts: ?In: 240 [P.O.:240] ?Out: 3022 [Other:3022] ?Total I/O ?In: -  ?Out: 300 [Urine:300] ?Net IO Since Admission: -3,082 mL [01/12/22 1311]  ?Blood pressure 133/69, pulse 73, temperature 98.7 ?F (37.1 ?C), temperature source Oral, resp. rate 18, height 6' 2"  (1.88 m), weight 122.8 kg, SpO2 98 %. ?Physical Exam ?Constitutional:   ?   Appearance: He is obese.  ?Neck:  ?   Vascular: No JVD.  ?Cardiovascular:  ?   Rate and Rhythm: Normal rate and regular rhythm.  ?   Pulses: Intact distal pulses.     ?     Carotid pulses are  on the right side with bruit and  on the left side with bruit. ?      Right popliteal pulse not accessible.  ?     Dorsalis pedis pulses are 0 on the left side. Right dorsalis pedis pulse not accessible.  ?     Posterior tibial pulses are 0 on the left side. Right posterior tibial pulse not accessible.  ?   Heart sounds: Normal heart sounds. No murmur heard. ?  No gallop.  ?Pulmonary:  ?   Effort: Pulmonary effort is normal.  ?   Breath sounds: Normal breath sounds.  ?Abdominal:  ?   General: Bowel sounds are normal.  ?   Palpations: Abdomen is soft.  ?Musculoskeletal:     ?   General: Deformity present. Injury: Right BKA. ?   Left lower leg: Edema (2 plus pitting edema) present.  ?Skin: ?   Capillary Refill: Capillary refill takes 2 to 3 seconds.  ? ? ? ?Lab Results: ?BMP ?BNP (last 3 results) ?No results for input(s): BNP in the last 8760 hours. ? ?ProBNP (last 3 results) ?No results for input(s): PROBNP in the last 8760 hours. ? ?  Latest Ref Rng & Units 01/12/2022  ?  7:23 AM 01/11/2022  ?  8:50 AM 09/07/2021  ?  2:38 AM  ?BMP  ?Glucose 70 - 99 mg/dL 128   317   335    ?BUN 8 - 23 mg/dL 24   50   44    ?Creatinine 0.61 - 1.24 mg/dL 4.68   7.59   5.82    ?Sodium 135 - 145 mmol/L 136   135   133    ?Potassium 3.5 - 5.1 mmol/L  4.8   4.0   3.8    ?Chloride 98 - 111 mmol/L 98   98   98    ?CO2 22 - 32 mmol/L 25   23   22     ?Calcium 8.9 - 10.3 mg/dL 8.1   7.6   8.6    ? ? ?  Latest Ref Rng & Units 01/11/2022  ?  8:50 AM 09/07/2021  ?  2:38 AM 09/06/2021  ?  1:26 AM  ?Hepatic Function  ?Total Protein 6.5 - 8.1 g/dL 5.9   5.9   5.9    ?Albumin 3.5 - 5.0 g/dL 2.9   3.0   3.1    ?AST 15 - 41 U/L 14   16   18     ?ALT 0 - 44 U/L 10   14   13     ?Alk Phosphatase 38 - 126 U/L 122   96   98    ?Total Bilirubin 0.3 -  1.2 mg/dL 0.5   0.8   0.6    ? ? ?  Latest Ref Rng & Units 01/12/2022  ?  7:23 AM 01/11/2022  ?  8:50 AM 09/07/2021  ?  2:38 AM  ?CBC  ?WBC 4.0 - 10.5 K/uL 4.9   6.4   4.8    ?Hemoglobin 13.0 - 17.0 g/dL 12.5   10.0   11.1    ?Hematocrit 39.0 - 52.0 % 37.1   29.7   33.0    ?Platelets 150 - 400 K/uL 157   174   151    ? ?Lipid Panel  ?   ?Component Value Date/Time  ? CHOL 117 05/30/2020 1646  ? TRIG 133 05/30/2020 1646  ? HDL 24 (L) 05/30/2020 1646  ? [place] 69 05/30/2020 1646  ? ?Cardiac Panel (last 3 results) ?No results for input(s): CKTOTAL, CKMB, TROPONINI, RELINDX in the last 72 hours. ? ?HEMOGLOBIN A1C ?Lab Results  ?Component Value Date  ? HGBA1C 7.6 (H) 01/11/2022  ? MPG 171.42 01/11/2022  ? ?TSH ?No results for input(s): TSH in the last 8760 hours. ?Imaging: ?Imaging results have been reviewed ? ?Cardiac Studies: ? ?EKG: EKG 01/11/2022: Sinus rhythm with first-degree AV block at a rate of 94 bpm, left axis deviation, left anterior fascicular block.  Right bundle branch block.  Bifascicular block.  No evidence of ischemia. ? ?Coronary angiogram 09/17/18: Widely patent stent stents. Normal LVEF. Coronary Angiogram 10/29/2014: CAD s/p stenting of the distal, mid and proximal RCA with implantation of 3 overlapping drug-eluting stent, from distal to proximal 2.5 x 38 mm, 2.75 x 38 mm and a 2.75 x 20 mm Promus premier DES.  ?   ?Peripheral arteriogram 06/23/2021: ?Proximally occluded left anterior tibial artery ?Successful revascularization  with PTA ?3.0 x 220, 2.0 x 220, 1.5 x 40 mm over-the-wire balloons ?0% residual stenosis, but sluggish flow into pedal plantar arch ?Patent AT and peroneal artery. Mild disease in the left SFA stents.  ?  ?Echocardiogram 06/17/2021: ?1. Left ventricular ejection fraction, by estimation, is 55 to 60%. The left ventricle has normal function. The left ventricle has no regional wall motion abnormalities. There is mild concentric left ventricular hypertrophy. Left ventricular diastolic  ?parameters are indeterminate. ? 2. Right ventricular systolic function is normal. The right ventricular size is normal. ? 3. Left atrial size was moderately dilated. ? 4. The mitral valve is normal in structure. No evidence of mitral valve regurgitation. Moderate mitral annular calcification. ? 5. The aortic valve is tricuspid. Aortic valve regurgitation is not visualized. Mild to moderate aortic valve sclerosis/calcification is present, without any evidence of aortic stenosis. ? ? ?Scheduled Meds: ? alum & mag hydroxide-simeth  30 mL Oral Once  ? And  ? lidocaine  15 mL Oral Once  ? amiodarone  200 mg Oral Daily  ? amLODipine  5 mg Oral Daily  ? atorvastatin  40 mg Oral QHS  ? Chlorhexidine Gluconate Cloth  6 each Topical Q0600  ? doxercalciferol  2 mcg Intravenous Q T,Th,Sa-HD  ? famotidine  20 mg Oral Daily  ? [START ON 01/13/2022] fentaNYL  1 patch Transdermal Y65K  ? folic acid  1 mg Oral Daily  ? insulin aspart  0-6 Units Subcutaneous TID WC  ? insulin aspart  4 Units Subcutaneous TID WC  ? insulin glargine-yfgn  10 Units Subcutaneous QHS  ? loratadine  10 mg Oral Daily  ? [START ON 01/13/2022] midodrine  10 mg Oral Q T,Th,Sa-HD  ? omega-3 acid  ethyl esters  2 g Oral BID  ? pantoprazole  40 mg Oral Daily  ? pregabalin  100 mg Oral BID  ? rOPINIRole  2 mg Oral QHS  ? tamsulosin  0.4 mg Oral Daily  ? ?Continuous Infusions: ? [START ON 01/13/2022] ceFEPime (MAXIPIME) IV    ? heparin 1,850 Units/hr (01/12/22 1303)  ? [START ON 01/13/2022]  vancomycin    ? ?PRN Meds:.hydrALAZINE, linaclotide, ondansetron **OR** ondansetron (ZOFRAN) IV, ondansetron, oxyCODONE-acetaminophen ? ?Assessment  ? ?1.  Peripheral arterial disease, nonhealing left foot ulcer, no revascularization options. ?2.  Tobacco use disorder and nicotine dependence severe, patient uses vapes. ?3.  Primary hypertension ?4.  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  Diabetes mellitus with diabetic nephropathy, peripheral neuropathy, peripheral arterial disease. ?6. Paroxysmal atrial fibrillation. ?CHA2DS2-VASc Score is 4.  Yearly risk of stroke: 4.8% (A, HTN, DM, Vasc Dz).  Score of 1=0.6; 2=2.2; 3=3.2; 4=4.8; 5=7.2; 6=9.8; 7=>9.8) ?-(CHF; HTN; vasc disease DM,  Male = 1; Age <65 =0; 65-74 = 1,  >75 =2; stroke/embolism= 2).   ? ?Plan:  ? ?From peripheral arterial disease standpoint, there is not much of revascularization options as I had performed angioplasty to his occluded left anterior tibial artery in September 2022 and in spite of this, the flow to the arch was very sluggish. ? ?Suspect he will end up needing transtibial amputation.  Awaiting MRI results to see if he has osteomyelitis.  In view of uncontrolled diabetes mellitus, severe peripheral artery disease, if indeed he has osteomyelitis or bone involvement, chances of healing would be extremely low. ? ?He has had some chest discomfort but appears at most related to GI issues, his nausea is improving.  Cardiac troponins negative.  No clinical evidence of heart failure. ? ?With current atrial fibrillation, is presently on IV heparin in view of anticipated probable need for surgery.  Otherwise previously was on Eliquis and he will need to be resumed on Eliquis once sedation is made regarding surgery. ? ?With regard to hypertension, blood pressure is now normalized since he got dialyzed yesterday. ? ?I will be on standby, call if questions. ? ?It is unfortunate that when I walked in today into his  room, he had a big sandwich from outside services. ? ? ? ?Adrian Prows, MD, [facility] ?01/12/2022, 1:11 PM ?Office: [phone] ?Fax: [phone] ?Pager: [phone]   ?

## 2022-01-12 NOTE — Hospital Course (Addendum)
66 y.o. male with PMH of chronic left foot ischemic ulcer infection and osteo s/p 6 weeks of IV antibiotics currently on doxycycline followed by 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  ? ?CT abdomen and pelvis without acute finding to explain patient's symptoms.  Left foot x-ray with lateral soft tissue ulceration and concern for underlying chronic osteomyelitis.  Started on IV vancomycin and cefepime.  Orthopedic surgery consulted.   ? ?MRI left foot without contrast with continued soft tissue ulceration of lateral foot and improved osteomyelitis of the lateral cuboid and resolved osteomyelitis of the fourth metatarsal base, and no abscess.  Orthopedic surgery recommended TMT amputation but patient prefers trial of IV antibiotics.  Infectious disease consulted and recommended continuing IV vancomycin and cefepime with hemodialysis until 01/25/2022.  Patient to follow-up with [name] outpatient.  Home health PT/OT/RN ordered. ? ?On the day of discharge, vomiting resolved.  Nausea improved.  He is discharged on p.o. Reglan.  We have increased p.o. Protonix to 40 mg twice daily for 1 months followed by once daily. ?

## 2022-01-12 NOTE — Progress Notes (Signed)
ANTICOAGULATION & ANTIBIOTIC CONSULT NOTE ? ?Pharmacy Consult for Heparin + Vancomycin/Cefepime ?Indication: chest pain/ACS + Infected ulcer/concern for osteomyelitis  ? ?Allergies  ?Allergen Reactions  ? Contrast Media [Iodinated Contrast Media] Shortness Of Breath and Other (See Comments)  ?  Difficulty breathing and altered mental status ? ?  ? Ivp Dye [Iodinated Contrast Media] Anaphylaxis, Shortness Of Breath and Other (See Comments)  ?  Breathing problems, altered mental state ?  ? Adhesive [Tape] Rash and Other (See Comments)  ?  Rash after 1 day of use  ? Latex Rash and Other (See Comments)  ?  A severe rash appears after the first 24 hours of being placed  ? ? ?Patient Measurements: ?Height: '6\' 2"'$  (188 cm) ?Weight: 122.8 kg (270 lb 11.6 oz) ?IBW/kg (Calculated) : 82.2 ?Heparin Dosing Weight: 104 kg ? ?Vital Signs: ?Temp: 98.7 ?F (37.1 ?C) (05/05 6811) ?Temp Source: Oral (05/05 5726) ?BP: 133/69 (05/05 0909) ?Pulse Rate: 73 (05/05 0909) ? ?Labs: ?Recent Labs  ?  01/11/22 ?2035 01/11/22 ?2232 01/12/22 ?5974 01/12/22 ?1008  ?HGB 10.0*  --  12.5*  --   ?HCT 29.7*  --  37.1*  --   ?PLT 174  --  157  --   ?APTT  --  32  --  51*  ?LABPROT  --  14.7  --   --   ?INR  --  1.2  --   --   ?HEPARINUNFRC  --   --   --  >1.10*  ?CREATININE 7.59*  --  4.68*  --   ?TROPONINIHS 15  --   --   --   ? ? ? ?Estimated Creatinine Clearance: 21.3 mL/min (A) (by C-G formula based on SCr of 4.68 mg/dL (H)). ? ?Assessment: ?65 yom with a history of severe PAD, R BKA, left toe amputations, DM, ESRD on HD, diabetic retinopathy, CAD, OSA, chronic neck and back pain, AF on eliquis. Patient is presenting with chest pain, emesis, and nausea.  Heparin per pharmacy consult placed for chest pain/ACS.  aPTT sub-therapeutic at 51 sec on heparin 1500 units/hr; heparin level falsely elevated as expected; no bleeding reported. ? ?Pharmacy also consulted to dose vancomycin and cefepime for infected ulcer and concern for osteomyelitis.  Patient has  ESRD and received HD on 5/4.  Appears that he will remain on TTS schedule.  Afebrile, WBC WNL. ? ?Vanc 5/4 >> ?Cefepime 5/4 >> ?  ?5/4 BCx -  ? ?Goal of Therapy:  ?Heparin level 0.3-0.7 units/ml ?aPTT 66-102 seconds ?Monitor platelets by anticoagulation protocol: Yes ?Pre-HD vanc level: 15-25 mcg/mL ?  ?Plan:  ?Increase heparin infusion to 1850 units/hr ?Check 8 hr aPTT ?Daily heparin level, aPTT and CBC ? ?Schedule vanc 1g IV qHD TTS ?Change cefepime to 2gm IV qHD TTS  ?Monitor HD schedule/tolerance, clinical progress, vanc level as indicated ? ?Antionette Luster D. Mina Marble, PharmD, BCPS, BCCCP ?01/12/2022, 10:58 AM ? ?

## 2022-01-12 NOTE — TOC Initial Note (Signed)
Transition of Care (TOC) - Initial/Assessment Note  ? ? ?Patient Details  ?Name: [name] ?MRN: [id number] ?Date of Birth: 1956/08/08 ? ?Transition of Care (TOC) CM/SW Contact:    ?Marilu Favre, RN ?Phone Number: ?01/12/2022, 10:12 AM ? ?Clinical Narrative:                 ?Await MRI results and treatment plan  ? ?Transition of Care (TOC) Screening Note ? ? ?Patient Details  ?Name: [name] ?Date of Birth: 1956-08-29 ? ? ? ? ? ?Transition of Care Department [facility]) has reviewed patient and no TOC needs have been identified at this time. We will continue to monitor patient advancement through interdisciplinary progression rounds. If new patient transition needs arise, please place a TOC consult. ?  ?  ?  ? ? ?Patient Goals and CMS Choice ?  ?  ?  ? ?Expected Discharge Plan and Services ?  ?  ?  ?  ?  ?                ?  ?  ?  ?  ?  ?  ?  ?  ?  ?  ? ?Prior Living Arrangements/Services ?  ?  ?  ?       ?  ?  ?  ?  ? ?Activities of Daily Living ?  ?  ? ?Permission Sought/Granted ?  ?  ?   ?   ?   ?   ? ?Emotional Assessment ?  ?  ?  ?  ?  ?  ? ?Admission diagnosis:  Osteomyelitis ([place]) [M86.9] ?Diabetic foot infection ([place]) [F68.127, L08.9] ?Patient Active Problem List  ? Diagnosis Date Noted  ? Intractable vomiting 01/11/2022  ? Osteomyelitis ([place]) 01/11/2022  ? Proliferative diabetic retinopathy of left eye with macular edema associated with type 2 diabetes mellitus ([place]) 11/27/2021  ? Vitreous hemorrhage of right eye ([place]) 11/27/2021  ? Acute metabolic encephalopathy 51/70/0174  ? COVID 09/02/2021  ? Sepsis ([place]) 09/02/2021  ? Severe sepsis with lactic acidosis ([place]) 06/14/2021  ? Infection associated with implanted penile prosthesis ([place]) 06/14/2021  ? ESRD on hemodialysis ([place]) 06/14/2021  ? Paroxysmal atrial fibrillation with RVR ([place]) 06/14/2021  ? Carotid stenosis 06/15/2020  ? Dyslipidemia 06/15/2020  ? Diabetic macular edema of right eye with proliferative retinopathy associated with type 2 diabetes  mellitus ([place]) 01/18/2020  ? Severe nonproliferative diabetic retinopathy of left eye, with macular edema, associated with type 2 diabetes mellitus ([place]) 01/04/2020  ? Retinal hemorrhage of right eye 01/04/2020  ? 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 Pseudoclaudication 11/15/2018  ? Essential hypertension 09/18/2018  ? S/P BKA (below knee amputation), right ([place]) 09/18/2018  ? Amputation of toe of left foot ([place]) 09/18/2018  ? Peripheral artery disease ([place]) 09/18/2018  ? S/P carotid endarterectomy 09/18/2018  ? OSA on CPAP 09/18/2018  ? Chronic back pain 09/18/2018  ? Status post reversal of ileostomy 05/21/2018  ? Normocytic anemia 02/14/2018  ? Intra-abdominal abscess ([place]) 11/04/2017  ? Chronic venous hypertension (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 ([place]) 12/11/2016  ? Ischemic ulcer of left foot, limited to breakdown of skin ([place]) 11/12/2016  ? Unilateral primary osteoarthritis, left knee 10/11/2016  ? History of right below knee amputation ([place]) 07/12/2016  ? Acute osteomyelitis of left foot ([place])   ? Diabetic foot infection ([place]) 05/04/2016  ? Insulin dependent type 2 diabetes mellitus ([place]) 12/16/2015  ? Amputated toe ([place]) 10/21/2015  ? Leg  edema, left 09/03/2015  ? CAD (dz of distal, mid and proximal RCA with implantation of 3 overlapping drug-eluting stent,) 09/03/2015  ? Chronic diastolic heart failure, NYHA class 2 ([place]) 09/03/2015  ? Angina pectoris associated with type 2 diabetes mellitus ([place]) 10/28/2014  ? Hyperlipidemia 08/25/2014  ? Limb pain 03/20/2013  ? DJD (degenerative joint disease) 09/25/2012  ? Migraine 09/25/2012  ? Neuropathy 09/25/2012  ? Restless legs syndrome (RLS) 09/25/2012  ? Chronic obstructive pulmonary disease, unspecified ([place]) 04/25/2012  ? Unknown cause of morbidity or mortality 04/25/2012  ? Chronic total occlusion of artery of the extremities ([place]) 04/08/2012  ? Onychomycosis 02/01/2012  ? Occlusion and stenosis of carotid artery without mention of  cerebral infarction 06/12/2011  ? GERD (gastroesophageal reflux disease) 05/08/2011  ? Barrett's esophagus without dysplasia 05/08/2011  ? Former tobacco use 02/01/2011  ? ?PCP:  [name], PA-C ?Pharmacy:   ?[id number] [place] ?[address] ?[id number]?Phone: [phone]: [phone]? ? ? ? ?Social Determinants of Health (SDOH) Interventions ?  ? ?Readmission Risk Interventions ? ?  06/30/2021  ? 11:53 AM  ?Readmission Risk Prevention Plan  ?Transportation Screening Complete  ?Medication Review (Press photographer Complete  ?[place] Home Care Consult Complete  ?SW Recovery Care/Counseling Consult Complete  ?Palliative Care Screening Not Applicable  ?[place] Applic[place]  ? ? ? ?

## 2022-01-12 NOTE — Progress Notes (Signed)
Reached out to MRI for estimated time pt will go. SWOT RN needed to help manage heparin drip in MRI.  ?Pending call back from tech regarding MRI time ?

## 2022-01-12 NOTE — Progress Notes (Addendum)
ANTICOAGULATION CONSULT NOTE - Follow Up Consult ? ?Pharmacy Consult for Heparin infusion ?Indication: chest pain/ACS and AFib ? ?Allergies  ?Allergen Reactions  ? Contrast Media [Iodinated Contrast Media] Shortness Of Breath and Other (See Comments)  ?  Difficulty breathing and altered mental status ? ?  ? Ivp Dye [Iodinated Contrast Media] Anaphylaxis, Shortness Of Breath and Other (See Comments)  ?  Breathing problems, altered mental state ?  ? Adhesive [Tape] Rash and Other (See Comments)  ?  Rash after 1 day of use  ? Latex Rash and Other (See Comments)  ?  A severe rash appears after the first 24 hours of being placed  ? ? ?Patient Measurements: ?Height: '6\' 2"'$  (188 cm) ?Weight: 122.8 kg (270 lb 11.6 oz) ?IBW/kg (Calculated) : 82.2 ?Heparin Dosing Weight: 108.8 kg ? ?Vital Signs: ?Temp: 98.4 ?F (36.9 ?C) (05/05 1933) ?Temp Source: Oral (05/05 1933) ?BP: 156/71 (05/05 1933) ?Pulse Rate: 70 (05/05 1933) ? ?Labs: ?Recent Labs  ?  01/11/22 ?0932 01/11/22 ?2232 01/12/22 ?3557 01/12/22 ?1008  ?HGB 10.0*  --  12.5*  --   ?HCT 29.7*  --  37.1*  --   ?PLT 174  --  157  --   ?APTT  --  32  --  51*  ?LABPROT  --  14.7  --   --   ?INR  --  1.2  --   --   ?HEPARINUNFRC  --   --   --  >1.10*  ?CREATININE 7.59*  --  4.68*  --   ?TROPONINIHS 15  --   --   --   ? ? ?Estimated Creatinine Clearance: 21.3 mL/min (A) (by C-G formula based on SCr of 4.68 mg/dL (H)). ? ? ?Medications:  ?Medications Prior to Admission  ?Medication Sig Dispense Refill Last Dose  ? amiodarone (PACERONE) 200 MG tablet Take 1 tablet (200 mg total) by mouth daily. Two (2) tablets for 10 days then one tab daily 100 tablet 1 01/11/2022  ? amLODipine (NORVASC) 5 MG tablet Take 5 mg by mouth daily.   01/11/2022  ? apixaban (ELIQUIS) 5 MG TABS tablet Take 1 tablet (5 mg total) by mouth 2 (two) times daily. 60 tablet 0 01/11/2022 at 0800  ? atorvastatin (LIPITOR) 40 MG tablet Take 40 mg by mouth at bedtime.    01/10/2022  ? B Complex-C-Zn-Folic Acid (DIALYVITE/ZINC)  TABS Take 1 tablet by mouth daily.   01/11/2022  ? doxycycline (VIBRA-TABS) 100 MG tablet TAKE 1 TABLET BY MOUTH TWICE A DAY (Patient taking differently: Take 100 mg by mouth 2 (two) times daily.) 60 tablet 0 01/11/2022  ? famotidine (PEPCID) 20 MG tablet Take 1 tablet (20 mg total) by mouth daily. 30 tablet 0 01/11/2022  ? fentaNYL (DURAGESIC) 50 MCG/HR Place 1 patch onto the skin every 3 (three) days. 2 patch 0 01/10/2022  ? folic acid (FOLVITE) 1 MG tablet Take 1 mg by mouth daily.   01/11/2022  ? folic acid-vitamin b complex-vitamin c-selenium-zinc (DIALYVITE) 3 MG TABS tablet Take 1 tablet by mouth daily.   01/11/2022  ? Glucosamine HCl (GLUCOSAMINE PO) Take 1 tablet by mouth 2 (two) times daily.   01/11/2022  ? HUMALOG KWIKPEN 100 UNIT/ML KwikPen Inject 4 Units into the skin 3 (three) times daily. 15 mL 0 01/11/2022  ? insulin glargine (LANTUS) 100 UNIT/ML injection Inject 0.1 mLs (10 Units total) into the skin at bedtime. (Patient taking differently: Inject 30 Units into the skin at bedtime.) 10 mL 0 01/10/2022  ?  ketoconazole (NIZORAL) 2 % cream Apply 1 application topically 2 (two) times daily as needed for irritation.   1 Past Week  ? lidocaine-prilocaine (EMLA) cream Apply 1 application topically See admin instructions. Prior to Dialysis days Tuesday,Thursday and saturday   01/11/2022  ? linaclotide (LINZESS) 145 MCG CAPS capsule Take 145 mcg by mouth daily as needed (constipation).    Past Month  ? loratadine (CLARITIN) 10 MG tablet Take 10 mg by mouth daily.   01/11/2022  ? midodrine (PROAMATINE) 10 MG tablet Take 1 tablet (10 mg total) by mouth every other day. One hour before dialysis 30 tablet 3 01/09/2022  ? nitroGLYCERIN (NITRODUR - DOSED IN MG/24 HR) 0.2 mg/hr patch Place 1 patch (0.2 mg total) onto the skin daily. 30 patch 12 01/11/2022  ? nitroGLYCERIN (NITROSTAT) 0.4 MG SL tablet Place 0.4 mg under the tongue every 5 (five) minutes as needed for chest pain.   unknown  ? omega-3 acid ethyl esters (LOVAZA) 1 g capsule  Take 2 g by mouth 2 (two) times daily.   01/11/2022  ? ondansetron (ZOFRAN) 4 MG tablet Take 4 mg by mouth every 6 (six) hours as needed for nausea or vomiting.   01/10/2022  ? oxyCODONE-acetaminophen (PERCOCET) 7.5-325 MG tablet Take 1 tablet by mouth 3 (three) times daily as needed for moderate pain. 10 tablet 0 01/11/2022  ? pantoprazole (PROTONIX) 40 MG tablet Take 40 mg by mouth daily.   01/11/2022  ? Polyvinyl Alcohol-Povidone PF 1.4-0.6 % SOLN Place 1 drop into both eyes 2 (two) times daily as needed (dry eyes).    01/11/2022  ? pregabalin (LYRICA) 100 MG capsule Take 100 mg by mouth 2 (two) times daily.   01/11/2022  ? rOPINIRole (REQUIP) 2 MG tablet Take 2 mg by mouth at bedtime.   01/10/2022  ? sevelamer carbonate (RENVELA) 800 MG tablet Take 800 mg by mouth 3 (three) times daily.   01/09/2022  ? tamsulosin (FLOMAX) 0.4 MG CAPS capsule Take 0.4 mg by mouth daily.   01/11/2022  ? NEEDLE, REUSABLE, 22 G 22G X 1-1/2" MISC 1 Units by Does not apply route as directed. For B12 IM inj 10 each 0   ? Syringe/Needle, Disp, (SYRINGE 3CC/22GX1-1/2") 22G X 1-1/2" 3 ML MISC 1 Syringe by Does not apply route as directed. For b12 IM inj 10 each 0   ? ? ?Assessment: ?57 yom with a history of severe PAD, R BKA, left toe amputations, DM, ESRD on HD, diabetic retinopathy, CAD, OSA, chronic neck and back pain, AF on eliquis. Patient is presenting with chest pain, emesis, and nausea.  Heparin per pharmacy consult placed for chest pain/ACS.  ? ?Last apixaban dose was 08:00 on 01/11/22 ? ?aPTT 50, subtherapeutic ?Current heparin infusion rate: 1850 units/hr ? ?Goal of Therapy:  ?Heparin level 0.3-0.7 units/ml ?aPTT 66-102 seconds ?Monitor platelets by anticoagulation protocol: Yes ?  ?Plan:  ?Increase heparin infusion rate to 2250 units/hr ?Check aPTT and heparin level at 0600 - continue to monitor until aPTT correlates with heparin level due to recent apixaban administration ?Monitor CBC and heparin level daily and assess for s/sx of  bleeding ? ?Kaleen Mask ?01/12/2022,9:08 PM ? ? ?

## 2022-01-12 NOTE — Progress Notes (Signed)
?  Progress Note ? ? ?Patient: [name] QPY:[id number] DOB: Aug 28, 1956 DOA: 01/11/2022     1 ?DOS: the patient was seen and examined on 01/12/2022 ?  ?Brief hospital course: ?66 y.o. male with medical history significant of chronic left foot ischemic ulcer infection on chronic doxycycline, PVD status post stenting and ballooning and status post right BKA, ESRD on HD TTS, IDDM, COPD, OSA not tolerating CPAP, HTN, HLD, chronic multijoint OA on narcotics, presented with worsening of nauseous vomiting. ? ?Patient has a chronic left lateral foot ulcer, has been following with [name] from wound care ? ?Assessment and Plan: ?No notes have been filed under this hospital service. ?Service: Hospitalist ? ?Left foot infected ulcer, chronic, failed outpatient treatment ?-Pt is continued on empiric vancomycin and cefepime ?-Orthopedic surgery following ?-Patient does have underlying PVD, underwent multiple intervention of the left leg including most recent ballooning of left tibial artery October 2022.  ?-MRI foot reviewed, findings of improved osteomyelitis noted ?-Defer further management to Orthopedic Surgery ?  ?Intractable nauseous vomiting ?-Suspect gastritis versus ulcer given the rather benign clinical course.  No signs of SBO this point.  CT abdomen pelvis with contrast reviewed, unremarkable ?-Continue H2 blocker and PPI. ?-Cont antiemetics as needed ?  ?IDDM with hyperglycemia ?-Continue Lantus 10 units at bedtime, continue Humalog 4 units 3 times daily AC, add sliding scale. ?-glycemic trends overall stable ?  ?ESRD on HD ?-Missed HD two times on 5/2 and 5/4 ?-Nephrology following for HD ?  ?PAF ?-In sinus rhythm, continue amiodarone ?-Currently on heparin gtt, off eliquis incase surgery was warranted ?  ?HTN, uncontrolled ?-Resume home BP meds amlodipine ?-Add PRN Hydralazine ?  ?PVD ?-s/p recent left tibial artery ballooning in October 2022 ?-He is on Eliquis and statin prior to admit, currently on heparin gtt per  above ?  ?BPH ?-Continue Flomax ?  ?Narcotic dependence ?-Continue fentanyl patch and as needed oxycodone ?  ? ?  ? ?Subjective: Complained of nausea this AM ? ?Physical Exam: ?Vitals:  ? 01/12/22 0013 01/12/22 0518 01/12/22 0909 01/12/22 1723  ?BP: (!) 170/57 (!) 139/52 133/69 (!) 159/53  ?Pulse: 72 (!) 59 73 76  ?Resp: '17 18 18 18  '$ ?Temp: 98.9 ?F (37.2 ?C) 98.5 ?F (36.9 ?C) 98.7 ?F (37.1 ?C) 98.7 ?F (37.1 ?C)  ?TempSrc: Oral Oral Oral Oral  ?SpO2: 99% 97% 98% 91%  ?Weight:      ?Height:      ? ?General exam: Awake, laying in bed, in nad ?Respiratory system: Normal respiratory effort, no wheezing ?Cardiovascular system: regular rate, s1, s2 ?Gastrointestinal system: Soft, nondistended, positive BS ?Central nervous system: CN2-12 grossly intact, strength intact ?Extremities: Perfused, no clubbing ?Skin: Normal skin turgor, no notable skin lesions seen ?Psychiatry: Mood normal // no visual hallucinations  ? ?Data Reviewed: ? ?Labs reviewed: K 4.8, Cr 4.68  ? ?Family Communication: Pt in room, family not at bedside ? ?Disposition: ?Status is: Inpatient ?Remains inpatient appropriate because: Severity of illness ? Planned Discharge Destination: Home ? ? ? ? ?Author: ?Marylu Lund, MD ?01/12/2022 6:32 PM ? ?For on call review [email or web address].  ?

## 2022-01-12 NOTE — Progress Notes (Signed)
Pt receives out-pt HD at [facility] on TTS. Pt arrives at 12:10 for 12:25 chair time. Will assist as needed.  ? ?Melven Sartorius ?Renal Navigator ?[phone] ?

## 2022-01-13 DIAGNOSIS — I739 Peripheral vascular disease, unspecified: Secondary | ICD-10-CM | POA: Diagnosis not present

## 2022-01-13 DIAGNOSIS — L8990 Pressure ulcer of unspecified site, unspecified stage: Secondary | ICD-10-CM | POA: Insufficient documentation

## 2022-01-13 DIAGNOSIS — M86672 Other chronic osteomyelitis, left ankle and foot: Secondary | ICD-10-CM

## 2022-01-13 DIAGNOSIS — N189 Chronic kidney disease, unspecified: Secondary | ICD-10-CM

## 2022-01-13 DIAGNOSIS — R5381 Other malaise: Secondary | ICD-10-CM

## 2022-01-13 DIAGNOSIS — E669 Obesity, unspecified: Secondary | ICD-10-CM

## 2022-01-13 DIAGNOSIS — K2970 Gastritis, unspecified, without bleeding: Secondary | ICD-10-CM

## 2022-01-13 DIAGNOSIS — G4733 Obstructive sleep apnea (adult) (pediatric): Secondary | ICD-10-CM

## 2022-01-13 DIAGNOSIS — I10 Essential (primary) hypertension: Secondary | ICD-10-CM

## 2022-01-13 DIAGNOSIS — D631 Anemia in chronic kidney disease: Secondary | ICD-10-CM

## 2022-01-13 DIAGNOSIS — L97425 Non-pressure chronic ulcer of left heel and midfoot with muscle involvement without evidence of necrosis: Secondary | ICD-10-CM

## 2022-01-13 DIAGNOSIS — I482 Chronic atrial fibrillation, unspecified: Secondary | ICD-10-CM

## 2022-01-13 DIAGNOSIS — E11621 Type 2 diabetes mellitus with foot ulcer: Secondary | ICD-10-CM

## 2022-01-13 DIAGNOSIS — I2510 Atherosclerotic heart disease of native coronary artery without angina pectoris: Secondary | ICD-10-CM

## 2022-01-13 DIAGNOSIS — L089 Local infection of the skin and subcutaneous tissue, unspecified: Secondary | ICD-10-CM | POA: Diagnosis not present

## 2022-01-13 DIAGNOSIS — E11628 Type 2 diabetes mellitus with other skin complications: Secondary | ICD-10-CM | POA: Diagnosis not present

## 2022-01-13 DIAGNOSIS — L89309 Pressure ulcer of unspecified buttock, unspecified stage: Secondary | ICD-10-CM | POA: Insufficient documentation

## 2022-01-13 DIAGNOSIS — I5032 Chronic diastolic (congestive) heart failure: Secondary | ICD-10-CM

## 2022-01-13 DIAGNOSIS — R112 Nausea with vomiting, unspecified: Secondary | ICD-10-CM

## 2022-01-13 LAB — HEPARIN LEVEL (UNFRACTIONATED): Heparin Unfractionated: >1.1 IU/mL — ABNORMAL HIGH (ref 0.30–0.70)

## 2022-01-13 LAB — COMPREHENSIVE METABOLIC PANEL
ALT: 12 U/L (ref 0–44)
AST: 15 U/L (ref 15–41)
Albumin: 3 g/dL — ABNORMAL LOW (ref 3.5–5.0)
Alkaline Phosphatase: 120 U/L (ref 38–126)
Anion gap: 12 (ref 5–15)
BUN: 33 mg/dL — ABNORMAL HIGH (ref 8–23)
CO2: 25 mmol/L (ref 22–32)
Calcium: 8.1 mg/dL — ABNORMAL LOW (ref 8.9–10.3)
Chloride: 100 mmol/L (ref 98–111)
Creatinine, Ser: 5.92 mg/dL — ABNORMAL HIGH (ref 0.61–1.24)
GFR, Estimated: 10 mL/min — ABNORMAL LOW (ref >60)
Glucose, Bld: 238 mg/dL — ABNORMAL HIGH (ref 70–99)
Potassium: 4.1 mmol/L (ref 3.5–5.1)
Sodium: 137 mmol/L (ref 135–145)
Total Bilirubin: 0.5 mg/dL (ref 0.3–1.2)
Total Protein: 6 g/dL — ABNORMAL LOW (ref 6.5–8.1)

## 2022-01-13 LAB — SEDIMENTATION RATE: Sed Rate: 40 mm/hr — ABNORMAL HIGH (ref 0–16)

## 2022-01-13 LAB — CBC
HCT: 31.7 % — ABNORMAL LOW (ref 39.0–52.0)
Hemoglobin: 10.6 g/dL — ABNORMAL LOW (ref 13.0–17.0)
MCH: 30 pg (ref 26.0–34.0)
MCHC: 33.4 g/dL (ref 30.0–36.0)
MCV: 89.8 fL (ref 80.0–100.0)
Platelets: 165 10*3/uL (ref 150–400)
RBC: 3.53 MIL/uL — ABNORMAL LOW (ref 4.22–5.81)
RDW: 12.7 % (ref 11.5–15.5)
WBC: 4.4 10*3/uL (ref 4.0–10.5)
nRBC: 0 % (ref 0.0–0.2)

## 2022-01-13 LAB — GLUCOSE, CAPILLARY
Glucose-Capillary: 179 mg/dL — ABNORMAL HIGH (ref 70–99)
Glucose-Capillary: 252 mg/dL — ABNORMAL HIGH (ref 70–99)
Glucose-Capillary: 287 mg/dL — ABNORMAL HIGH (ref 70–99)

## 2022-01-13 LAB — APTT
aPTT: 142 seconds — ABNORMAL HIGH (ref 24–36)
aPTT: 81 seconds — ABNORMAL HIGH (ref 24–36)

## 2022-01-13 LAB — C-REACTIVE PROTEIN: CRP: 3.6 mg/dL — ABNORMAL HIGH (ref <1.0)

## 2022-01-13 MED ORDER — NITROGLYCERIN 0.2 MG/HR TD PT24
0.2000 mg | MEDICATED_PATCH | Freq: Every day | TRANSDERMAL | Status: DC
  Administered 2022-01-13 – 2022-01-14 (×2): 0.2 mg via TRANSDERMAL
  Filled 2022-01-13 (×3): qty 1

## 2022-01-13 MED ORDER — METOCLOPRAMIDE HCL 5 MG/ML IJ SOLN
5.0000 mg | Freq: Four times a day (QID) | INTRAMUSCULAR | Status: DC | PRN
  Filled 2022-01-13: qty 1

## 2022-01-13 MED ORDER — ONDANSETRON HCL 4 MG/2ML IJ SOLN: 4.0000 mg | Freq: Four times a day (QID) | INTRAMUSCULAR | Status: DC | PRN

## 2022-01-13 MED ORDER — HEPARIN (PORCINE) 25000 UT/250ML-% IV SOLN
1950.0000 [IU]/h | INTRAVENOUS | Status: DC
  Administered 2022-01-13 (×2): 2000 [IU]/h via INTRAVENOUS
  Filled 2022-01-13 (×2): qty 250

## 2022-01-13 MED ORDER — MAGIC MOUTHWASH W/LIDOCAINE
5.0000 mL | Freq: Four times a day (QID) | ORAL | Status: DC
  Administered 2022-01-13 – 2022-01-14 (×3): 5 mL via ORAL
  Filled 2022-01-13 (×8): qty 5

## 2022-01-13 MED ORDER — PANTOPRAZOLE SODIUM 40 MG PO TBEC
40.0000 mg | DELAYED_RELEASE_TABLET | Freq: Two times a day (BID) | ORAL | Status: DC
  Administered 2022-01-13 – 2022-01-14 (×2): 40 mg via ORAL
  Filled 2022-01-13 (×2): qty 1

## 2022-01-13 NOTE — Assessment & Plan Note (Addendum)
Recent Labs  ?  09/02/21 ?1718 09/03/21 ?0122 09/04/21 ?0351 09/05/21 ?0151 09/06/21 ?0126 09/07/21 ?0238 01/11/22 ?0174 01/12/22 ?9449 01/13/22 ?6759 01/14/22 ?0111  ?HGB 12.2* 9.2* 10.4* 10.2* 11.9* 11.1* 10.0* 12.5* 10.6* 9.2*  ?H&H seems to be at baseline. ?Recheck CBC at follow-up. ? ?

## 2022-01-13 NOTE — Progress Notes (Signed)
[facility] ?Progress Note ? ?Subjective: seen on HD, no c/o today. "When can I go home?" ?  ?Vitals:  ? 01/13/22 1100 01/13/22 1117 01/13/22 1120 01/13/22 1216  ?BP: 124/64 123/61 (!) 142/62 (!) 137/57  ?Pulse: 72 72 66 70  ?Resp:  '18 18 18  '$ ?Temp:  97.9 ?F (36.6 ?C) 97.9 ?F (36.6 ?C) 97.8 ?F (36.6 ?C)  ?TempSrc:    Oral  ?SpO2:   98% 100%  ?Weight:   117.7 kg   ?Height:      ? ? ?Exam: ?Gen alert, no distress ?No jvd or bruits ?Chest clear bilat to bases ?RRR no RG ?Abd soft ntnd no mass or ascites +bs ?Ext 1+ LLE edema, R BKA w/ prosthetic in place ?Neuro is alert, Ox 3 , nf ?   LUE AVF +bruit ?  ?  ?  ?  ? Home meds include - amiodarone, norvasc 5, lipitor, pepcid, duragesic patch, humalog+ lantus insulin, midodrine 10 mg pre hd tiw, percocet, protonix, lyrica 100 bid, requip 2, flomax, prns/ vits/ supps ?  ?  ? CXR 5/04 - IMPRESSION: No appreciable airspace consolidation or pulmonary edema. ?  ? OP HD: TTS [place] ? 4h 26mn   110kg   450/800      2/2 bath  LUE AVF   Hep none ? - last HD 4/29, 117 > 115 kg ? - last Hb 11.4 on 4/25 ? - venofer weekly '50mg'$  ? - hectorol 2 ug tiw IV ?  ?  ?Assessment/ Plan: ?Sepsis/ osteo L foot - diab foot ulcer which failed OP Rx, started on IV vanc/ cefepime here. Per pmd.   ?Nausea/ vomiting - better ?ESRD - on HD TTS. Missed 1 op HD. Had HD here Thursday. Next HD today.  ?BP - on norvasc 5 + midodrine 10 pre HD tiw. BP's up at bit, added hold orders to midodrine  ?Volume - sig edema L leg. CXR neg. Max UF w/ HD today.  ?Anemia esrd - Hb 10 here, hold weekly IV Fe w/ infection. Transfuse prn. Not on esa at OP unit.  ?MBD ckd - CCa and phos in range. Cont IV vdra w/ hd. He is not sure the name of his binder.  ?PAF - on amio, eliquis switched to IV heparin here.  ?  ?[name] ?01/13/2022, 2:35 PM ? ? ? ?Recent Labs  ?Lab 01/11/22 ?0735305/04/23 ?1638 01/12/22 ?0299205/06/23 ?04268 ?HGB 10.0*  --  12.5* 10.6*  ?ALBUMIN 2.9*  --   --  3.0*  ?CALCIUM 7.6*  --  8.1*  8.1*  ?PHOS  --  5.5*  --   --   ?CREATININE 7.59*  --  4.68* 5.92*  ?K 4.0  --  4.8 4.1  ? ? ?Inpatient medications: ? amiodarone  200 mg Oral Daily  ? amLODipine  5 mg Oral Daily  ? atorvastatin  40 mg Oral QHS  ? Chlorhexidine Gluconate Cloth  6 each Topical Q0600  ? doxercalciferol  2 mcg Intravenous Q T,Th,Sa-HD  ? fentaNYL  1 patch Transdermal QT41D ? folic acid  1 mg Oral Daily  ? insulin aspart  0-6 Units Subcutaneous TID WC  ? insulin aspart  4 Units Subcutaneous TID WC  ? insulin glargine-yfgn  10 Units Subcutaneous QHS  ? loratadine  10 mg Oral Daily  ? magic mouthwash w/lidocaine  5 mL Oral QID  ? midodrine  10 mg Oral Q T,Th,Sa-HD  ? omega-3 acid ethyl esters  2  g Oral BID  ? pantoprazole  40 mg Oral BID  ? pregabalin  100 mg Oral BID  ? rOPINIRole  2 mg Oral QHS  ? tamsulosin  0.4 mg Oral Daily  ? ? ceFEPime (MAXIPIME) IV    ? heparin 2,000 Units/hr (01/13/22 1235)  ? vancomycin Stopped (01/13/22 1059)  ? ?hydrALAZINE, linaclotide, metoCLOPramide (REGLAN) injection, ondansetron (ZOFRAN) IV, oxyCODONE-acetaminophen ? ? ? ? ? ? ?

## 2022-01-13 NOTE — Progress Notes (Signed)
ANTICOAGULATION CONSULT NOTE - Follow Up Consult ? ?Pharmacy Consult for Heparin ?Indication: chest pain/ACS and atrial fibrillation ? ?Allergies  ?Allergen Reactions  ? Contrast Media [Iodinated Contrast Media] Shortness Of Breath and Other (See Comments)  ?  Difficulty breathing and altered mental status ? ?  ? Ivp Dye [Iodinated Contrast Media] Anaphylaxis, Shortness Of Breath and Other (See Comments)  ?  Breathing problems, altered mental state ?  ? Adhesive [Tape] Rash and Other (See Comments)  ?  Rash after 1 day of use  ? Latex Rash and Other (See Comments)  ?  A severe rash appears after the first 24 hours of being placed  ? ? ?Patient Measurements: ?Height: '6\' 2"'$  (188 cm) ?Weight: 117.7 kg (259 lb 7.7 oz) ?IBW/kg (Calculated) : 82.2 kg ?Heparin Dosing Weight: 108.8 kg ? ?Vital Signs: ?Temp: 99 ?F (37.2 ?C) (05/06 1919) ?Temp Source: Oral (05/06 1919) ?BP: 121/53 (05/06 1919) ?Pulse Rate: 76 (05/06 1919) ? ?Labs: ?Recent Labs  ?  01/11/22 ?6962 01/11/22 ?9528 01/11/22 ?2232 01/12/22 ?4132 01/12/22 ?1008 01/12/22 ?2224 01/13/22 ?4401 01/13/22 ?1738  ?HGB 10.0*  --   --  12.5*  --   --  10.6*  --   ?HCT 29.7*  --   --  37.1*  --   --  31.7*  --   ?PLT 174  --   --  157  --   --  165  --   ?APTT  --    < > 32  --  51* 50* 142* 81*  ?LABPROT  --   --  14.7  --   --   --   --   --   ?INR  --   --  1.2  --   --   --   --   --   ?HEPARINUNFRC  --   --   --   --  >1.10*  --  >1.10*  --   ?CREATININE 7.59*  --   --  4.68*  --   --  5.92*  --   ?TROPONINIHS 15  --   --   --   --   --   --   --   ? < > = values in this interval not displayed.  ? ? ? ?Estimated Creatinine Clearance: 16.5 mL/min (A) (by C-G formula based on SCr of 5.92 mg/dL (H)). ? ?Medications:  ?Medications Prior to Admission  ?Medication Sig Dispense Refill Last Dose  ? amiodarone (PACERONE) 200 MG tablet Take 1 tablet (200 mg total) by mouth daily. Two (2) tablets for 10 days then one tab daily 100 tablet 1 01/11/2022  ? amLODipine (NORVASC) 5 MG  tablet Take 5 mg by mouth daily.   01/11/2022  ? apixaban (ELIQUIS) 5 MG TABS tablet Take 1 tablet (5 mg total) by mouth 2 (two) times daily. 60 tablet 0 01/11/2022 at 0800  ? atorvastatin (LIPITOR) 40 MG tablet Take 40 mg by mouth at bedtime.    01/10/2022  ? B Complex-C-Zn-Folic Acid (DIALYVITE/ZINC) TABS Take 1 tablet by mouth daily.   01/11/2022  ? doxycycline (VIBRA-TABS) 100 MG tablet TAKE 1 TABLET BY MOUTH TWICE A DAY (Patient taking differently: Take 100 mg by mouth 2 (two) times daily.) 60 tablet 0 01/11/2022  ? famotidine (PEPCID) 20 MG tablet Take 1 tablet (20 mg total) by mouth daily. 30 tablet 0 01/11/2022  ? fentaNYL (DURAGESIC) 50 MCG/HR Place 1 patch onto the skin every 3 (three) days. 2 patch 0  01/10/2022  ? folic acid (FOLVITE) 1 MG tablet Take 1 mg by mouth daily.   01/11/2022  ? folic acid-vitamin b complex-vitamin c-selenium-zinc (DIALYVITE) 3 MG TABS tablet Take 1 tablet by mouth daily.   01/11/2022  ? Glucosamine HCl (GLUCOSAMINE PO) Take 1 tablet by mouth 2 (two) times daily.   01/11/2022  ? HUMALOG KWIKPEN 100 UNIT/ML KwikPen Inject 4 Units into the skin 3 (three) times daily. 15 mL 0 01/11/2022  ? insulin glargine (LANTUS) 100 UNIT/ML injection Inject 0.1 mLs (10 Units total) into the skin at bedtime. (Patient taking differently: Inject 30 Units into the skin at bedtime.) 10 mL 0 01/10/2022  ? ketoconazole (NIZORAL) 2 % cream Apply 1 application topically 2 (two) times daily as needed for irritation.   1 Past Week  ? lidocaine-prilocaine (EMLA) cream Apply 1 application topically See admin instructions. Prior to Dialysis days Tuesday,Thursday and saturday   01/11/2022  ? linaclotide (LINZESS) 145 MCG CAPS capsule Take 145 mcg by mouth daily as needed (constipation).    Past Month  ? loratadine (CLARITIN) 10 MG tablet Take 10 mg by mouth daily.   01/11/2022  ? midodrine (PROAMATINE) 10 MG tablet Take 1 tablet (10 mg total) by mouth every other day. One hour before dialysis 30 tablet 3 01/09/2022  ? nitroGLYCERIN  (NITRODUR - DOSED IN MG/24 HR) 0.2 mg/hr patch Place 1 patch (0.2 mg total) onto the skin daily. 30 patch 12 01/11/2022  ? nitroGLYCERIN (NITROSTAT) 0.4 MG SL tablet Place 0.4 mg under the tongue every 5 (five) minutes as needed for chest pain.   unknown  ? omega-3 acid ethyl esters (LOVAZA) 1 g capsule Take 2 g by mouth 2 (two) times daily.   01/11/2022  ? ondansetron (ZOFRAN) 4 MG tablet Take 4 mg by mouth every 6 (six) hours as needed for nausea or vomiting.   01/10/2022  ? oxyCODONE-acetaminophen (PERCOCET) 7.5-325 MG tablet Take 1 tablet by mouth 3 (three) times daily as needed for moderate pain. 10 tablet 0 01/11/2022  ? pantoprazole (PROTONIX) 40 MG tablet Take 40 mg by mouth daily.   01/11/2022  ? Polyvinyl Alcohol-Povidone PF 1.4-0.6 % SOLN Place 1 drop into both eyes 2 (two) times daily as needed (dry eyes).    01/11/2022  ? pregabalin (LYRICA) 100 MG capsule Take 100 mg by mouth 2 (two) times daily.   01/11/2022  ? rOPINIRole (REQUIP) 2 MG tablet Take 2 mg by mouth at bedtime.   01/10/2022  ? sevelamer carbonate (RENVELA) 800 MG tablet Take 800 mg by mouth 3 (three) times daily.   01/09/2022  ? tamsulosin (FLOMAX) 0.4 MG CAPS capsule Take 0.4 mg by mouth daily.   01/11/2022  ? NEEDLE, REUSABLE, 22 G 22G X 1-1/2" MISC 1 Units by Does not apply route as directed. For B12 IM inj 10 each 0   ? Syringe/Needle, Disp, (SYRINGE 3CC/22GX1-1/2") 22G X 1-1/2" 3 ML MISC 1 Syringe by Does not apply route as directed. For b12 IM inj 10 each 0   ? ?Infusions:  ? ceFEPime (MAXIPIME) IV 2 g (01/13/22 1800)  ? heparin 2,000 Units/hr (01/13/22 1235)  ? vancomycin Stopped (01/13/22 1059)  ? ? ?Assessment: ?66 yo male with a history of severe PAD, R BKA, left toe amputations, DM, ESRD on HD, diabetic retinopathy, CAD, OSA, chronic neck and back pain, and AFib presents with chest pain, emesis, and nausea. PTA the patient is on apixaban, last dose taken on 5/4 at 0800. The patient will remain  on IV anticoagulation while awaiting a decision  regarding surgery. Pharmacy is consulted to dose heparin. ? ?Repeat aPTT this evening is therapeutic. ? ?Goal of Therapy:  ?Heparin level 0.3-0.7 units/ml ?aPTT 66-102 seconds ?Monitor platelets by anticoagulation protocol: Yes ?  ?Plan:  ?Continue heparin 2000 units/h ?Daily aPTT, HL, CBC ? ?Arrie Senate, PharmD, BCPS, BCCP ?Clinical Pharmacist ?[phone] ?Please check AMION for all [place] numbers ?01/13/2022 ? ? ? ? ?

## 2022-01-13 NOTE — Assessment & Plan Note (Signed)
No hypoxia. ?

## 2022-01-13 NOTE — Assessment & Plan Note (Signed)
TTE in 06/2021 with LVEF of 55 to 60% and indeterminate DD.  Appears euvolemic on exam.  No cardiopulmonary symptoms.  Makes some urine as well. ?-Fluid management by hemodialysis ?

## 2022-01-13 NOTE — Assessment & Plan Note (Addendum)
Missed regular HD on 5/2 and 5/4 due to nausea/vomiting and weakness. ?-Had HD on 5/4 and 5/6 in the hospital. ?-Cleared for discharge by nephrology ?

## 2022-01-13 NOTE — Assessment & Plan Note (Addendum)
Continue home amiodarone and Eliquis ?

## 2022-01-13 NOTE — Consult Note (Addendum)
? ? ?[facility]  ?                                                                                     ? ?Patient Identification: ?Patient Name: [name] MRN: [id number] [place] Date: 01/11/2022  8:30 AM ?Today's Date: 01/13/2022 ?Reason for consult: Chronic osteomyelitis with cellulitis ?Requesting provider: Wendee Beavers  ? ?Active Problems: ?  Acute osteomyelitis of left foot ([place]) ?  Ischemic ulcer of left foot, limited to breakdown of skin ([place]) ?  ESRD on hemodialysis ([place]) ?  Intractable vomiting ?  Osteomyelitis ([place]) ? ? ?Antibiotics:  ?Vancomycin 5/4 ?Cefepime 5/4 ? ?Lines/Hardware: Rt knee arthroplasty ? ?Assessment ?# Chronic Left foot DFU/Ischemic Ulcer/Chronic Osteomyelitis in the setting of PAD s/p vascular intervention with possible cellulitis  ?S/p recent completion of 6 weeks course of Vancomycin and cefepime with HD through Feb 4/23, On PO doxycyline for since Mid March by Ortho. Follows with Dr [name] OP. Refused TTA per discussion with Ortho  ?Most recent angiogram 06/2021:  PTA left anterior tibial artery ?MRI left foot with improved findings  ?Vascular surgery - not much of revascularization options as has angioplasty to his occluded left anterior tibial artery in September 2022 and in spite of this, the flow to the arch was very sluggish. ? ?# Nausea/vomiting: Gastritis vs PED, on H2 blockers, less likely related to systemic symptom of left foot ulcer. Work up per primary  ? ?# DM 2 ?# ESRD on HD ? ?Recommendations  ?Continue Vancomycin and cefepime with HD for 2 weeks total. EOT 01/25/22. High likely will eventually end up having BKA. ?Monitor CBC, BMP and Vancomycin trough  ?Wound care ?Fu with Orthopedics as instructed ?ID will sign off. Please call with questions  ? ?Rest of the management as per the primary team. Please call with questions or concerns.  ?Thank you for the consult ? ?Rosiland Oz,  MD ?Infectious Disease Physician ?[facility] ?[address] ?[place], [place] [address] ?Phone: [phone]  Fax: [phone] ? ?__________________________________________________________________________________________________________ ?HPI and Hospital Course: ?66 year old male with PMH of ESRD on HD, chronic diastolic CHF, DJD, paroxysmal A-fib, type II DM, CAD s/p CEAsevere PAD status post vascular intervention and right BKA, hx of enterococcal/strep B penile implant infection s/p removal in oct 2022, left foot diabetic/ischemiculcer s/p left 4th and 5th ray amputation + excision of 5th metatarsal base and partial cuboid ?On chronic p.o. doxycycline, COPD , obesity/OSA , HTN, HLD who presented to the ED on 5/4 with nausea, vomiting chills, worsening chronic left foot wound and and chest pain.  Nausea and vomiting has been ongoing for a week and had missed 2 sessions of HD prior to admit.  ? ?At ED, afebrile, no leukocytosis ?Labs remarkable for BG 317 ?Chest x-ray 5/4 no appreciable consolidation or pulmonary edema.  Near atelectasis versus scarring within the lateral left lung base ?Left foot x-ray 5/4 with underlying chronic osteomyelitis ?CT abdomen pelvis 5/4 with no acute abnormality ?MRI left foot 5/5 continued soft tissue ulceration of the lateral foot adjacent to the fourth TMT joint.  No abscess.  Improved  osteomyelitis of the lateral cuboid.  Resolved osteomyelitis of the fourth metatarsal base. ? ?ROS: all systems reviewed with pertinent positives and negatives as listed above  ? ?Past Medical History:  ?Diagnosis Date  ? Carotid artery occlusion 11/10/10  ? LEFT CAROTID ENDARTERECTOMY  ? Chronic kidney disease   ? Complication of anesthesia   ? BP WENT UP AT [facility] "  ? COPD (chronic obstructive pulmonary disease) ([place])   ? pt denies this dx as of 06/01/20 - no inhaler   ? Diabetes mellitus without complication ([place])   ? Diverticulitis   ?  Diverticulosis of colon (without mention of hemorrhage)   ? DJD (degenerative joint disease)   ? knees/hands/feet/back/neck  ? Fatty liver   ? Full dentures   ? GERD (gastroesophageal reflux disease)   ? H/O hiatal hernia   ? History of blood transfusion   ? with a past surical procedure per patient 06/01/20  ? Hyperlipidemia   ? Hypertension   ? Neuromuscular disorder ([place])   ? peripheral neuropathy  ? Non-pressure chronic ulcer of other part of left foot limited to breakdown of skin ([place]) 11/12/2016  ? Osteomyelitis ([place])   ? left 5th metatarsal  ? PAD (peripheral artery disease) ([place])   ? Distal aortogram June 2012. Atherectomy left popliteal artery July 2012.   ? Pseudoclaudication 11/15/2018  ? Sleep apnea   ? pt denies this dx as of 06/01/20  ? Slurred speech   ? AS PER WIFE IN D/C NOTE 11/10/10  ? 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 Unstable angina ([place]) 09/16/2018  ? Wears glasses   ? ?Past Surgical History:  ?Procedure Laterality Date  ? ABDOMINAL AORTOGRAM W/LOWER EXTREMITY N/A 06/23/2021  ? Procedure: ABDOMINAL AORTOGRAM W/LOWER EXTREMITY;  Surgeon: Nigel Mormon, MD;  Location: [facility];  Service: Cardiovascular;  Laterality: N/A;  ? AMPUTATION  11/05/2011  ? Procedure: AMPUTATION RAY;  Surgeon: Wylene Simmer, MD;  Location: [facility];  Service: Orthopedics;  Laterality: Right;  Amputation of Right 4&5th Toes  ? AMPUTATION Left 11/26/2012  ? Procedure: AMPUTATION RAY;  Surgeon: Wylene Simmer, MD;  Location: [facility];  Service: Orthopedics;  Laterality: Left;  fourth ray amputation  ? AMPUTATION Right 08/27/2014  ? Procedure: Transmetatarsal Amputation;  Surgeon: Newt Minion, MD;  Location: [facility];  Service: Orthopedics;  Laterality: Right;  ? AMPUTATION Right 01/14/2015  ? Procedure: AMPUTATION BELOW KNEE;  Surgeon: Newt Minion, MD;  Location: [facility];  Service: Orthopedics;  Laterality: Right;  ? AMPUTATION Left 10/21/2015  ? Procedure: Left Foot 5th Ray Amputation;  Surgeon: Newt Minion, MD;  Location: [facility];   Service: Orthopedics;  Laterality: Left;  ? ANTERIOR FUSION CERVICAL SPINE  02/06/06  ? C4-5, C5-6, C6-7; SURGEON DR. MAX COHEN  ? AV FISTULA PLACEMENT Left 06/02/2020  ? Procedure: ARTERIOVENOUS (AV) FISTULA CREATION LEFT;  Surgeon: Waynetta Sandy, MD;  Location: [facility];  Service: Vascular;  Laterality: Left;  ? BACK SURGERY    ? x 3  ? BASCILIC VEIN TRANSPOSITION Left 07/21/2020  ? Procedure: LEFT UPPER ARM ATERIOVENOUS SUPERFISTULALIZATION;  Surgeon: Waynetta Sandy, MD;  Location: [facility];  Service: Vascular;  Laterality: Left;  ? BELOW KNEE LEG AMPUTATION Right   ? CARDIAC CATHETERIZATION  10/31/04  ? 2009  ? CAROTID ENDARTERECTOMY  11/10/10  ? CAROTID ENDARTERECTOMY Left 11/10/2010  ? Subtotal occlusion of left internal carotid artery with left hemispheric transient ischemic attacks.  ? CAROTID STENT    ?  CARPAL TUNNEL RELEASE Right 10/21/2013  ? Procedure: RIGHT CARPAL TUNNEL RELEASE;  Surgeon: Wynonia Sours, MD;  Location: [facility];  Service: Orthopedics;  Laterality: Right;  ? CHOLECYSTECTOMY    ? COLON SURGERY    ? COLONOSCOPY    ? COLOSTOMY REVERSAL  05/21/2018  ? ileostomy reversal  ? CYSTOSCOPY WITH STENT PLACEMENT Bilateral 01/13/2018  ? Procedure: CYSTOSCOPY WITH BILATERAL URETERAL CATHETER PLACEMENT;  Surgeon: Ardis Hughs, MD;  Location: [facility];  Service: Urology;  Laterality: Bilateral;  ? ESOPHAGEAL MANOMETRY Bilateral 07/19/2014  ? Procedure: ESOPHAGEAL MANOMETRY (EM);  Surgeon: Jerene Bears, MD;  Location: [facility];  Service: Gastroenterology;  Laterality: Bilateral;  ? EYE SURGERY Bilateral 2020  ? cataract  ? FEMORAL ARTERY STENT    ? x6  ? FINGER SURGERY    ? FOOT SURGERY  04/25/2016  ?  EXCISION BASE 5TH METATARSAL AND PARTIAL CUBOID LEFT FOOT  ? HERNIA REPAIR    ? LEFT INGUINAL AND UMBILICAL REPAIRS  ? HERNIA REPAIR    ? I & D EXTREMITY Left 04/25/2016  ? Procedure: EXCISION BASE 5TH METATARSAL AND PARTIAL CUBOID LEFT FOOT;  Surgeon: Newt Minion, MD;   Location: [facility];  Service: Orthopedics;  Laterality: Left;  ? ILEOSTOMY  01/13/2018  ? Procedure: ILEOSTOMY;  Surgeon: Clovis Riley, MD;  Location: [facility];  Service: General;;  ? ILEOSTOMY CLOSURE N/A 05/21/2018  ? Proc

## 2022-01-13 NOTE — Progress Notes (Signed)
Patient ID: [name], male   DOB: 03-22-1956, 66 y.o.   MRN: [id number] ?Patient is seen in follow-up for left foot necrotic wound and osteomyelitis-itis.  The MRI scan does show edema in the cuboid consistent with persistent osteomyelitis.  Patient has an overlying necrotic wound.  Discussed with the patient we could excise further bone however at this point there is no soft tissue coverage of the wound.  Recommended proceeding with a transtibial amputation.  Patient states he would like to continue antibiotics a little while longer and make the determination of surgery later on.  I would continue with dressing changes to the left foot and I will follow-up in the office after discharge. ?

## 2022-01-13 NOTE — Procedures (Signed)
Pt arrived to unit with BP of 191/85, his right arm is very edematous. Pt states his arm has been like that for several days.  Complains of generalized pain however did not ask for any pain mgmt.  Vital signs remained stable throughout the treatment.  Hectorol and Vanc both given.  Final BP was 149/62 and UF removed was 4L.  Pt stable at time of transfer to unit. ?

## 2022-01-13 NOTE — Assessment & Plan Note (Addendum)
Has epigastric pain and tenderness which suggested some element of gastritis.  He carries history of Barrett's esophagus without dysplasia.  He could also have diabetic gastropathy from uncontrolled diabetes.  Vomiting and epigastric pain resolved.  Nausea improved. ?-Increase Protonix to 40 mg twice daily.  Discontinued Pepcid. ?-Reglan 5 mg ACHS ?-Use Zofran for refractory nausea and vomiting ?

## 2022-01-13 NOTE — Assessment & Plan Note (Signed)
Body mass index is 33.32 kg/m?. ?-Encourage lifestyle change to lose weight ?

## 2022-01-13 NOTE — Assessment & Plan Note (Addendum)
Followed by pulm and cardiology.  Had multiple interventions of left leg including most recent PCI.  ?-Cardiology recommends medical management ?

## 2022-01-13 NOTE — Plan of Care (Signed)
  Problem: Education: Goal: Knowledge of General Education information will improve Description: Including pain rating scale, medication(s)/side effects and non-pharmacologic comfort measures Outcome: Progressing   Problem: Activity: Goal: Risk for activity intolerance will decrease Outcome: Progressing   Problem: Nutrition: Goal: Adequate nutrition will be maintained Outcome: Progressing   Problem: Safety: Goal: Ability to remain free from injury will improve Outcome: Progressing   

## 2022-01-13 NOTE — Progress Notes (Signed)
ANTICOAGULATION CONSULT NOTE - Follow Up Consult ? ?Pharmacy Consult for Heparin ?Indication: chest pain/ACS and atrial fibrillation ? ?Allergies  ?Allergen Reactions  ? Contrast Media [Iodinated Contrast Media] Shortness Of Breath and Other (See Comments)  ?  Difficulty breathing and altered mental status ? ?  ? Ivp Dye [Iodinated Contrast Media] Anaphylaxis, Shortness Of Breath and Other (See Comments)  ?  Breathing problems, altered mental state ?  ? Adhesive [Tape] Rash and Other (See Comments)  ?  Rash after 1 day of use  ? Latex Rash and Other (See Comments)  ?  A severe rash appears after the first 24 hours of being placed  ? ? ?Patient Measurements: ?Height: '6\' 2"'$  (188 cm) ?Weight: 122.8 kg (270 lb 11.6 oz) ?IBW/kg (Calculated) : 82.2 kg ?Heparin Dosing Weight: 108.8 kg ? ?Vital Signs: ?Temp: 98.3 ?F (36.8 ?C) (05/06 9379) ?Temp Source: Oral (05/06 0240) ?BP: 155/69 (05/06 0603) ?Pulse Rate: 72 (05/06 0603) ? ?Labs: ?Recent Labs  ?  01/11/22 ?9735 01/11/22 ?2232 01/12/22 ?3299 01/12/22 ?1008 01/12/22 ?2224  ?HGB 10.0*  --  12.5*  --   --   ?HCT 29.7*  --  37.1*  --   --   ?PLT 174  --  157  --   --   ?APTT  --  32  --  51* 50*  ?LABPROT  --  14.7  --   --   --   ?INR  --  1.2  --   --   --   ?HEPARINUNFRC  --   --   --  >1.10*  --   ?CREATININE 7.59*  --  4.68*  --   --   ?TROPONINIHS 15  --   --   --   --   ? ? ?Estimated Creatinine Clearance: 21.3 mL/min (A) (by C-G formula based on SCr of 4.68 mg/dL (H)). ? ?Medications:  ?Medications Prior to Admission  ?Medication Sig Dispense Refill Last Dose  ? amiodarone (PACERONE) 200 MG tablet Take 1 tablet (200 mg total) by mouth daily. Two (2) tablets for 10 days then one tab daily 100 tablet 1 01/11/2022  ? amLODipine (NORVASC) 5 MG tablet Take 5 mg by mouth daily.   01/11/2022  ? apixaban (ELIQUIS) 5 MG TABS tablet Take 1 tablet (5 mg total) by mouth 2 (two) times daily. 60 tablet 0 01/11/2022 at 0800  ? atorvastatin (LIPITOR) 40 MG tablet Take 40 mg by mouth at  bedtime.    01/10/2022  ? B Complex-C-Zn-Folic Acid (DIALYVITE/ZINC) TABS Take 1 tablet by mouth daily.   01/11/2022  ? doxycycline (VIBRA-TABS) 100 MG tablet TAKE 1 TABLET BY MOUTH TWICE A DAY (Patient taking differently: Take 100 mg by mouth 2 (two) times daily.) 60 tablet 0 01/11/2022  ? famotidine (PEPCID) 20 MG tablet Take 1 tablet (20 mg total) by mouth daily. 30 tablet 0 01/11/2022  ? fentaNYL (DURAGESIC) 50 MCG/HR Place 1 patch onto the skin every 3 (three) days. 2 patch 0 01/10/2022  ? folic acid (FOLVITE) 1 MG tablet Take 1 mg by mouth daily.   01/11/2022  ? folic acid-vitamin b complex-vitamin c-selenium-zinc (DIALYVITE) 3 MG TABS tablet Take 1 tablet by mouth daily.   01/11/2022  ? Glucosamine HCl (GLUCOSAMINE PO) Take 1 tablet by mouth 2 (two) times daily.   01/11/2022  ? HUMALOG KWIKPEN 100 UNIT/ML KwikPen Inject 4 Units into the skin 3 (three) times daily. 15 mL 0 01/11/2022  ? insulin glargine (LANTUS) 100 UNIT/ML injection  Inject 0.1 mLs (10 Units total) into the skin at bedtime. (Patient taking differently: Inject 30 Units into the skin at bedtime.) 10 mL 0 01/10/2022  ? ketoconazole (NIZORAL) 2 % cream Apply 1 application topically 2 (two) times daily as needed for irritation.   1 Past Week  ? lidocaine-prilocaine (EMLA) cream Apply 1 application topically See admin instructions. Prior to Dialysis days Tuesday,Thursday and saturday   01/11/2022  ? linaclotide (LINZESS) 145 MCG CAPS capsule Take 145 mcg by mouth daily as needed (constipation).    Past Month  ? loratadine (CLARITIN) 10 MG tablet Take 10 mg by mouth daily.   01/11/2022  ? midodrine (PROAMATINE) 10 MG tablet Take 1 tablet (10 mg total) by mouth every other day. One hour before dialysis 30 tablet 3 01/09/2022  ? nitroGLYCERIN (NITRODUR - DOSED IN MG/24 HR) 0.2 mg/hr patch Place 1 patch (0.2 mg total) onto the skin daily. 30 patch 12 01/11/2022  ? nitroGLYCERIN (NITROSTAT) 0.4 MG SL tablet Place 0.4 mg under the tongue every 5 (five) minutes as needed for chest  pain.   unknown  ? omega-3 acid ethyl esters (LOVAZA) 1 g capsule Take 2 g by mouth 2 (two) times daily.   01/11/2022  ? ondansetron (ZOFRAN) 4 MG tablet Take 4 mg by mouth every 6 (six) hours as needed for nausea or vomiting.   01/10/2022  ? oxyCODONE-acetaminophen (PERCOCET) 7.5-325 MG tablet Take 1 tablet by mouth 3 (three) times daily as needed for moderate pain. 10 tablet 0 01/11/2022  ? pantoprazole (PROTONIX) 40 MG tablet Take 40 mg by mouth daily.   01/11/2022  ? Polyvinyl Alcohol-Povidone PF 1.4-0.6 % SOLN Place 1 drop into both eyes 2 (two) times daily as needed (dry eyes).    01/11/2022  ? pregabalin (LYRICA) 100 MG capsule Take 100 mg by mouth 2 (two) times daily.   01/11/2022  ? rOPINIRole (REQUIP) 2 MG tablet Take 2 mg by mouth at bedtime.   01/10/2022  ? sevelamer carbonate (RENVELA) 800 MG tablet Take 800 mg by mouth 3 (three) times daily.   01/09/2022  ? tamsulosin (FLOMAX) 0.4 MG CAPS capsule Take 0.4 mg by mouth daily.   01/11/2022  ? NEEDLE, REUSABLE, 22 G 22G X 1-1/2" MISC 1 Units by Does not apply route as directed. For B12 IM inj 10 each 0   ? Syringe/Needle, Disp, (SYRINGE 3CC/22GX1-1/2") 22G X 1-1/2" 3 ML MISC 1 Syringe by Does not apply route as directed. For b12 IM inj 10 each 0   ? ?Infusions:  ? ceFEPime (MAXIPIME) IV    ? heparin 2,250 Units/hr (01/13/22 0546)  ? vancomycin    ? ? ?Assessment: ?66 yo male with a history of severe PAD, R BKA, left toe amputations, DM, ESRD on HD, diabetic retinopathy, CAD, OSA, chronic neck and back pain, and AFib presents with chest pain, emesis, and nausea. PTA the patient is on apixaban, last dose taken on 5/4 at 0800. The patient will remain on IV anticoagulation while awaiting a decision regarding surgery. Pharmacy is consulted to dose heparin. ? ?aPTT is supratherapeutic at 142 while heparin is running at 2250 units/hr. Per the RN, there have been no issues or interruptions with the infusion and the patient is without signs or symptoms of bleeding. Hgb 10.6,  platelets 165. Due to recent apixaban administration, will continue to monitor with aPTTs until the aPTT correlates with the heparin level. The patient does not have orders for heparin in dialysis due to current heparin  infusion administration. ? ?Goal of Therapy:  ?Heparin level 0.3-0.7 units/ml ?aPTT 66-102 seconds ?Monitor platelets by anticoagulation protocol: Yes ?  ?Plan:  ?HOLD heparin infusion x 1 hour, then resume heparin IV at 2000 units/hr ?Obtain a 6-hour aPTT ?Monitor for signs and symptoms of bleeding ?Obtain a daily heparin level, aPTT, and CBC ? ?Shauna Hugh, PharmD, RPh  ?PGY-2 Pharmacy Resident ?01/13/2022 7:03 AM ? ?Please check [email or web address] for unit-specific pharmacy phone numbers. ? ? ? ?

## 2022-01-13 NOTE — Assessment & Plan Note (Addendum)
HH PT/OT/RN ordered. ?Patient has wheelchair at home ?

## 2022-01-13 NOTE — Assessment & Plan Note (Signed)
No chest pain.  Continue home statin and anticoagulation ?

## 2022-01-13 NOTE — Assessment & Plan Note (Addendum)
Normotensive for most part. ?-Continue home amlodipine ?

## 2022-01-13 NOTE — Assessment & Plan Note (Signed)
>>  ASSESSMENT AND PLAN FOR PAD (PERIPHERAL ARTERY DISEASE) (HCC) WRITTEN ON 01/14/2022  4:27 PM BY GONFA, MIGNON DASEN, MD  Followed by pulm and cardiology.  Had multiple interventions of left leg including most recent PCI.  -Cardiology recommends medical management

## 2022-01-13 NOTE — Assessment & Plan Note (Addendum)
Seems to have completed 6 weeks of IV cefepime and vancomycin last month and a started on p.o. doxycycline.  He has underlying PVD for which he underwent multiple intervention including recent PCI of left tibial artery in 06/2021.  Imaging with persistent left lateral foot ulceration and osteomyelitis.  Patient deferred surgical intervention/TMT amputation.  Started on IV cefepime and vancomycin.  CRP 3.6.  ESR 40. ?-ID consulted and recommended continuing IV cefepime and vancomycin until 5/18. ?-Dry to wet dressing daily ?-Outpatient follow-up with orthopedic surgery ?

## 2022-01-13 NOTE — Progress Notes (Signed)
?PROGRESS NOTE ? ?[name] MRN:[id number] DOB: 10/24/1955  ? ?PCP: Conroy, Nathan, PA-C ? ?Patient is from: Home. ? ?DOA: 01/11/2022 LOS: 2 ? ?Chief complaints ?Chief Complaint  ?Patient presents with  ? Chest Pain  ? Nausea  ? Emesis  ?  ? ?Brief Narrative / Interim history: ?65 y.o. male with PMH of chronic left foot ischemic ulcer infection on chronic doxycycline followed by Dr. 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  ? ?CT abdomen and pelvis without acute finding to explain patient's symptoms.  Left foot x-ray with lateral soft tissue ulceration and concern for underlying chronic osteomyelitis.  Started on IV vancomycin and cefepime.  Orthopedic surgery consulted.  MRI left foot without contrast with continued soft tissue ulceration of lateral foot and improved osteomyelitis of the lateral cuboid and resolved osteomyelitis of the fourth metatarsal base, and no abscess.  Orthopedic surgery recommended TMT amputation but patient prefers trial of IV antibiotics.  Infectious disease consulted for guidance on antibiotics  ? ?Subjective: ?Seen and examined earlier this morning.  No major events overnight of this morning.  He reports some sore throat but improved.  Continues to endorse nausea, dry heaving and epigastric pain.  Some pain in his left foot.  He denies chest pain or trouble breathing.  He denies dysphagia or odynophagia. ? ?Objective: ?Vitals:  ? 01/13/22 1100 01/13/22 1117 01/13/22 1120 01/13/22 1216  ?BP: 124/64 123/61 (!) 142/62 (!) 137/57  ?Pulse: 72 72 66 70  ?Resp:  18 18 18  ?Temp:  97.9 ?F (36.6 ?C) 97.9 ?F (36.6 ?C) 97.8 ?F (36.6 ?C)  ?TempSrc:    Oral  ?SpO2:   98% 100%  ?Weight:   117.7 kg   ?Height:      ? ? ?Examination: ? ?GENERAL: No apparent distress.  Nontoxic. ?HEENT: MMM.  Wears dentures.  Vision and  hearing grossly intact.  ?NECK: Supple.  No apparent JVD.  ?RESP:  No IWOB.  Fair aeration bilaterally. ?CVS:  RRR. Heart sounds normal.  ?ABD/GI/GU: BS+. Abd soft.  Mild epigastric tenderness. ?MSK/EXT: Right BKA.  Left foot swelling.  Ulceration over left foot laterally.  No drainage ?SKIN: no apparent skin lesion or wound ?NEURO: Awake, alert and oriented appropriately.  No apparent focal neuro deficit. ?PSYCH: Calm. Normal affect.  ? ?Procedures:  ?None ? ?Microbiology summarized: ?Blood cultures NGTD. ?MRSA PCR screen negative. ? ?Assessment and Plan: ?Infected diabetic left foot ulcer with chronic osteomyelitis ?Patient with chronic diabetic left foot ulcer and chronic osteomyelitis on p.o. doxycycline outpatient.  He has underlying PVD for which he underwent multiple intervention including recent PCI of left tibial artery in 06/2021.  Imaging with persistent left lateral foot ulceration and osteomyelitis.  Patient deferred surgical intervention/TMT amputation.  Started on IV cefepime and vancomycin ?-ID consulted for guidance on IV antibiotics ?-Check CRP and ESR ? ?Intractable nausea and vomiting with epigastric pain ?Has epigastric pain and tenderness which suggested some element of gastritis.  He carries history of Barrett's esophagus without dysplasia.  He could also have diabetic gastropathy from uncontrolled diabetes.  He has bowel movement to suggest SBO. ?-Increase Protonix to 40 mg twice daily.  Discontinue Pepcid. ?-Reglan every 6 hours as needed nausea and vomiting ?-Use Zofran for refractory nausea and vomiting ? ?PAD (peripheral artery disease) (HCC) ?Followed by pulm and   cardiology.  Had multiple interventions of left leg including most recent PCI.  ?-Appreciate input by cardiology ?-Medical management ? ?CAD (dz of distal, mid and proximal RCA with implantation of 3 overlapping drug-eluting stent,) ?No chest pain.  Continue home statin and anticoagulation ? ?Obesity (BMI 30-39.9) ?Body mass  index is 33.32 kg/m?. ?-Encourage lifestyle change to lose weight ? ?Anemia of renal disease ?Recent Labs  ?  09/02/21 ?1700 09/02/21 ?1718 09/03/21 ?0122 09/04/21 ?0351 09/05/21 ?0151 09/06/21 ?0126 09/07/21 ?0238 01/11/22 ?0850 01/12/22 ?0723 01/13/22 ?0633  ?HGB 11.9* 12.2* 9.2* 10.4* 10.2* 11.9* 11.1* 10.0* 12.5* 10.6*  ?H&H seems to be at baseline. ?Continue monitoring ? ? ?Physical deconditioning ?PT/OT eval. ? ?Atrial fibrillation, chronic (HCC) ?On amiodarone and Eliquis at home. ?-Currently on heparin for anticoagulation in case he changes his mind for surgery ?-Continue home amiodarone. ?-Appreciate input by cardiology ? ?ESRD on hemodialysis (HCC) ?Reportedly missed HD on 5/2 and 5/4.  Appears euvolemic.  On dialysis this morning. ?-Per nephrology ? ?OSA not tolerating CPAP ?No hypoxia. ? ?Essential hypertension ?Normotensive for most part. ?-Continue home amlodipine ?-P.o. hydralazine as needed ? ?Chronic diastolic heart failure, NYHA class 2 (HCC) ?TTE in 06/2021 with LVEF of 55 to 60% and indeterminate DD.  Appears euvolemic on exam.  No cardiopulmonary symptoms.  Makes some urine as well. ?-Fluid management by hemodialysis ? ? ?DVT prophylaxis:  ?On IV heparin for anticoagulation ? ?Code Status: Full code ?Family Communication: Patient and/or RN. Available if any question.  ?Level of care: Med-Surg ?Status is: Inpatient ?Remains inpatient appropriate because: Infected left foot diabetic ulcer and osteomyelitis requiring IV antibiotics and further evaluation ? ? ?Final disposition: TBD ?Consultants:  ?Orthopedic surgery ?Nephrology ?Infectious disease ? ?Sch Meds:  ?Scheduled Meds: ? amiodarone  200 mg Oral Daily  ? amLODipine  5 mg Oral Daily  ? atorvastatin  40 mg Oral QHS  ? Chlorhexidine Gluconate Cloth  6 each Topical Q0600  ? doxercalciferol  2 mcg Intravenous Q T,Th,Sa-HD  ? fentaNYL  1 patch Transdermal Q72H  ? folic acid  1 mg Oral Daily  ? insulin aspart  0-6 Units Subcutaneous TID WC  ?  insulin aspart  4 Units Subcutaneous TID WC  ? insulin glargine-yfgn  10 Units Subcutaneous QHS  ? loratadine  10 mg Oral Daily  ? magic mouthwash w/lidocaine  5 mL Oral QID  ? midodrine  10 mg Oral Q T,Th,Sa-HD  ? omega-3 acid ethyl esters  2 g Oral BID  ? pantoprazole  40 mg Oral BID  ? pregabalin  100 mg Oral BID  ? rOPINIRole  2 mg Oral QHS  ? tamsulosin  0.4 mg Oral Daily  ? ?Continuous Infusions: ? ceFEPime (MAXIPIME) IV    ? heparin    ? vancomycin 1,000 mg (01/13/22 0959)  ? ?PRN Meds:.hydrALAZINE, linaclotide, metoCLOPramide (REGLAN) injection, ondansetron (ZOFRAN) IV, oxyCODONE-acetaminophen ? ?Antimicrobials: ?Anti-infectives (From admission, onward)  ? ? Start     Dose/Rate Route Frequency Ordered Stop  ? 01/13/22 1800  ceFEPIme (MAXIPIME) 2 g in sodium chloride 0.9 % 100 mL IVPB       ? 2 g ?200 mL/hr over 30 Minutes Intravenous Every T-Th-Sa (1800) 01/12/22 1101    ? 01/13/22 1200  vancomycin (VANCOCIN) IVPB 1000 mg/200 mL premix  Status:  Discontinued       ? 1,000 mg ?200 mL/hr over 60 Minutes Intravenous Every T-Th-Sa (Hemodialysis) 01/11/22 1408 01/11/22 2133  ? 01/13/22 1200  vancomycin (VANCOCIN) IVPB 1000 mg/200 mL premix       ?   1,000 mg ?200 mL/hr over 60 Minutes Intravenous Every T-Th-Sa (Hemodialysis) 01/12/22 1101    ? 01/12/22 2200  ceFEPIme (MAXIPIME) 1 g in sodium chloride 0.9 % 100 mL IVPB  Status:  Discontinued       ? 1 g ?200 mL/hr over 30 Minutes Intravenous Every 24 hours 01/11/22 2133 01/12/22 1101  ? 01/12/22 1400  ceFEPIme (MAXIPIME) 1 g in sodium chloride 0.9 % 100 mL IVPB  Status:  Discontinued       ? 1 g ?200 mL/hr over 30 Minutes Intravenous Every 24 hours 01/11/22 1407 01/11/22 2133  ? 01/11/22 2200  vancomycin (VANCOCIN) 2,250 mg in sodium chloride 0.9 % 500 mL IVPB       ? 2,250 mg ?261.3 mL/hr over 120 Minutes Intravenous  Once 01/11/22 2133 01/12/22 0341  ? 01/11/22 2200  ceFEPIme (MAXIPIME) 2 g in sodium chloride 0.9 % 100 mL IVPB       ? 2 g ?200 mL/hr over 30  Minutes Intravenous  Once 01/11/22 2133 01/12/22 0029  ? 01/11/22 2133  vancomycin variable dose per unstable renal function (pharmacist dosing)  Status:  Discontinued       ?  Does not apply See admin instructions 05/

## 2022-01-14 DIAGNOSIS — I2510 Atherosclerotic heart disease of native coronary artery without angina pectoris: Secondary | ICD-10-CM | POA: Diagnosis not present

## 2022-01-14 DIAGNOSIS — M86672 Other chronic osteomyelitis, left ankle and foot: Secondary | ICD-10-CM | POA: Diagnosis not present

## 2022-01-14 DIAGNOSIS — R112 Nausea with vomiting, unspecified: Secondary | ICD-10-CM | POA: Diagnosis not present

## 2022-01-14 DIAGNOSIS — I739 Peripheral vascular disease, unspecified: Secondary | ICD-10-CM | POA: Diagnosis not present

## 2022-01-14 LAB — CBC
HCT: 26.2 % — ABNORMAL LOW (ref 39.0–52.0)
Hemoglobin: 9.2 g/dL — ABNORMAL LOW (ref 13.0–17.0)
MCH: 31.1 pg (ref 26.0–34.0)
MCHC: 35.1 g/dL (ref 30.0–36.0)
MCV: 88.5 fL (ref 80.0–100.0)
Platelets: 165 10*3/uL (ref 150–400)
RBC: 2.96 MIL/uL — ABNORMAL LOW (ref 4.22–5.81)
RDW: 12.7 % (ref 11.5–15.5)
WBC: 4.4 10*3/uL (ref 4.0–10.5)
nRBC: 0 % (ref 0.0–0.2)

## 2022-01-14 LAB — GLUCOSE, CAPILLARY
Glucose-Capillary: 285 mg/dL — ABNORMAL HIGH (ref 70–99)
Glucose-Capillary: 234 mg/dL — ABNORMAL HIGH (ref 70–99)

## 2022-01-14 LAB — RENAL FUNCTION PANEL
Albumin: 2.6 g/dL — ABNORMAL LOW (ref 3.5–5.0)
Anion gap: 7 (ref 5–15)
BUN: 16 mg/dL (ref 8–23)
CO2: 27 mmol/L (ref 22–32)
Calcium: 8 mg/dL — ABNORMAL LOW (ref 8.9–10.3)
Chloride: 102 mmol/L (ref 98–111)
Creatinine, Ser: 3.94 mg/dL — ABNORMAL HIGH (ref 0.61–1.24)
GFR, Estimated: 16 mL/min — ABNORMAL LOW (ref >60)
Glucose, Bld: 274 mg/dL — ABNORMAL HIGH (ref 70–99)
Phosphorus: 3.5 mg/dL (ref 2.5–4.6)
Potassium: 4 mmol/L (ref 3.5–5.1)
Sodium: 136 mmol/L (ref 135–145)

## 2022-01-14 LAB — APTT: aPTT: 105 seconds — ABNORMAL HIGH (ref 24–36)

## 2022-01-14 LAB — MAGNESIUM: Magnesium: 1.8 mg/dL (ref 1.7–2.4)

## 2022-01-14 LAB — HEPARIN LEVEL (UNFRACTIONATED): Heparin Unfractionated: 0.7 IU/mL (ref 0.30–0.70)

## 2022-01-14 MED ORDER — PANTOPRAZOLE SODIUM 40 MG PO TBEC: DELAYED_RELEASE_TABLET | ORAL | 0 refills | Status: DC

## 2022-01-14 MED ORDER — APIXABAN 5 MG PO TABS
5.0000 mg | ORAL_TABLET | Freq: Two times a day (BID) | ORAL | Status: DC
  Administered 2022-01-14: 5 mg via ORAL
  Filled 2022-01-14: qty 1

## 2022-01-14 MED ORDER — METOCLOPRAMIDE HCL 5 MG PO TABS: 5.0000 mg | ORAL_TABLET | Freq: Three times a day (TID) | ORAL | 1 refills | Status: DC

## 2022-01-14 MED ORDER — SILVER SULFADIAZINE 1 % EX CREA
TOPICAL_CREAM | Freq: Every day | CUTANEOUS | Status: DC
  Filled 2022-01-14: qty 85

## 2022-01-14 MED ORDER — SODIUM CHLORIDE 0.9 % IV SOLN: 2.0000 g | INTRAVENOUS | Status: AC

## 2022-01-14 MED ORDER — VANCOMYCIN HCL IN DEXTROSE 1-5 GM/200ML-% IV SOLN: 1000.0000 mg | INTRAVENOUS | Status: AC

## 2022-01-14 MED ORDER — APIXABAN 5 MG PO TABS: 5.0000 mg | ORAL_TABLET | Freq: Two times a day (BID) | ORAL | 1 refills | Status: AC

## 2022-01-14 MED ORDER — SILVER SULFADIAZINE 1 % EX CREA: TOPICAL_CREAM | Freq: Every day | CUTANEOUS | 0 refills | Status: DC

## 2022-01-14 NOTE — Progress Notes (Addendum)
ANTICOAGULATION CONSULT NOTE - Follow Up Consult ? ?Pharmacy Consult for Heparin ?Indication: chest pain/ACS and atrial fibrillation ? ?Allergies  ?Allergen Reactions  ? Contrast Media [Iodinated Contrast Media] Shortness Of Breath and Other (See Comments)  ?  Difficulty breathing and altered mental status ? ?  ? Ivp Dye [Iodinated Contrast Media] Anaphylaxis, Shortness Of Breath and Other (See Comments)  ?  Breathing problems, altered mental state ?  ? Adhesive [Tape] Rash and Other (See Comments)  ?  Rash after 1 day of use  ? Latex Rash and Other (See Comments)  ?  A severe rash appears after the first 24 hours of being placed  ? ? ?Patient Measurements: ?Height: '6\' 2"'$  (188 cm) ?Weight: 117.7 kg (259 lb 7.7 oz) ?IBW/kg (Calculated) : 82.2 kg ?Heparin Dosing Weight: 108.8 kg ? ?Vital Signs: ?Temp: 97.8 ?F (36.6 ?C) (05/07 0447) ?Temp Source: Oral (05/07 0447) ?BP: 141/59 (05/07 0447) ?Pulse Rate: 65 (05/07 0447) ? ?Labs: ?Recent Labs  ?  01/11/22 ?5573 01/11/22 ?2202 01/11/22 ?2232 01/12/22 ?5427 01/12/22 ?1008 01/12/22 ?2224 01/13/22 ?0623 01/13/22 ?1738 01/14/22 ?0111  ?HGB 10.0*  --   --  12.5*  --   --  10.6*  --  9.2*  ?HCT 29.7*  --   --  37.1*  --   --  31.7*  --  26.2*  ?PLT 174  --   --  157  --   --  165  --  165  ?APTT  --    < > 32  --  51*   < > 142* 81* 105*  ?LABPROT  --   --  14.7  --   --   --   --   --   --   ?INR  --   --  1.2  --   --   --   --   --   --   ?HEPARINUNFRC  --   --   --   --  >1.10*  --  >1.10*  --  0.70  ?CREATININE 7.59*  --   --  4.68*  --   --  5.92*  --  3.94*  ?TROPONINIHS 15  --   --   --   --   --   --   --   --   ? < > = values in this interval not displayed.  ? ? ?Estimated Creatinine Clearance: 24.8 mL/min (A) (by C-G formula based on SCr of 3.94 mg/dL (H)). ? ? ?Medications:  ?Medications Prior to Admission  ?Medication Sig Dispense Refill Last Dose  ? amiodarone (PACERONE) 200 MG tablet Take 1 tablet (200 mg total) by mouth daily. Two (2) tablets for 10 days then one  tab daily 100 tablet 1 01/11/2022  ? amLODipine (NORVASC) 5 MG tablet Take 5 mg by mouth daily.   01/11/2022  ? apixaban (ELIQUIS) 5 MG TABS tablet Take 1 tablet (5 mg total) by mouth 2 (two) times daily. 60 tablet 0 01/11/2022 at 0800  ? atorvastatin (LIPITOR) 40 MG tablet Take 40 mg by mouth at bedtime.    01/10/2022  ? B Complex-C-Zn-Folic Acid (DIALYVITE/ZINC) TABS Take 1 tablet by mouth daily.   01/11/2022  ? doxycycline (VIBRA-TABS) 100 MG tablet TAKE 1 TABLET BY MOUTH TWICE A DAY (Patient taking differently: Take 100 mg by mouth 2 (two) times daily.) 60 tablet 0 01/11/2022  ? famotidine (PEPCID) 20 MG tablet Take 1 tablet (20 mg total) by mouth daily. 30 tablet 0  01/11/2022  ? fentaNYL (DURAGESIC) 50 MCG/HR Place 1 patch onto the skin every 3 (three) days. 2 patch 0 01/10/2022  ? folic acid (FOLVITE) 1 MG tablet Take 1 mg by mouth daily.   01/11/2022  ? folic acid-vitamin b complex-vitamin c-selenium-zinc (DIALYVITE) 3 MG TABS tablet Take 1 tablet by mouth daily.   01/11/2022  ? Glucosamine HCl (GLUCOSAMINE PO) Take 1 tablet by mouth 2 (two) times daily.   01/11/2022  ? HUMALOG KWIKPEN 100 UNIT/ML KwikPen Inject 4 Units into the skin 3 (three) times daily. 15 mL 0 01/11/2022  ? insulin glargine (LANTUS) 100 UNIT/ML injection Inject 0.1 mLs (10 Units total) into the skin at bedtime. (Patient taking differently: Inject 30 Units into the skin at bedtime.) 10 mL 0 01/10/2022  ? ketoconazole (NIZORAL) 2 % cream Apply 1 application topically 2 (two) times daily as needed for irritation.   1 Past Week  ? lidocaine-prilocaine (EMLA) cream Apply 1 application topically See admin instructions. Prior to Dialysis days Tuesday,Thursday and saturday   01/11/2022  ? linaclotide (LINZESS) 145 MCG CAPS capsule Take 145 mcg by mouth daily as needed (constipation).    Past Month  ? loratadine (CLARITIN) 10 MG tablet Take 10 mg by mouth daily.   01/11/2022  ? midodrine (PROAMATINE) 10 MG tablet Take 1 tablet (10 mg total) by mouth every other day. One hour  before dialysis 30 tablet 3 01/09/2022  ? nitroGLYCERIN (NITRODUR - DOSED IN MG/24 HR) 0.2 mg/hr patch Place 1 patch (0.2 mg total) onto the skin daily. 30 patch 12 01/11/2022  ? nitroGLYCERIN (NITROSTAT) 0.4 MG SL tablet Place 0.4 mg under the tongue every 5 (five) minutes as needed for chest pain.   unknown  ? omega-3 acid ethyl esters (LOVAZA) 1 g capsule Take 2 g by mouth 2 (two) times daily.   01/11/2022  ? ondansetron (ZOFRAN) 4 MG tablet Take 4 mg by mouth every 6 (six) hours as needed for nausea or vomiting.   01/10/2022  ? oxyCODONE-acetaminophen (PERCOCET) 7.5-325 MG tablet Take 1 tablet by mouth 3 (three) times daily as needed for moderate pain. 10 tablet 0 01/11/2022  ? pantoprazole (PROTONIX) 40 MG tablet Take 40 mg by mouth daily.   01/11/2022  ? Polyvinyl Alcohol-Povidone PF 1.4-0.6 % SOLN Place 1 drop into both eyes 2 (two) times daily as needed (dry eyes).    01/11/2022  ? pregabalin (LYRICA) 100 MG capsule Take 100 mg by mouth 2 (two) times daily.   01/11/2022  ? rOPINIRole (REQUIP) 2 MG tablet Take 2 mg by mouth at bedtime.   01/10/2022  ? sevelamer carbonate (RENVELA) 800 MG tablet Take 800 mg by mouth 3 (three) times daily.   01/09/2022  ? tamsulosin (FLOMAX) 0.4 MG CAPS capsule Take 0.4 mg by mouth daily.   01/11/2022  ? NEEDLE, REUSABLE, 22 G 22G X 1-1/2" MISC 1 Units by Does not apply route as directed. For B12 IM inj 10 each 0   ? Syringe/Needle, Disp, (SYRINGE 3CC/22GX1-1/2") 22G X 1-1/2" 3 ML MISC 1 Syringe by Does not apply route as directed. For b12 IM inj 10 each 0   ? ?Infusions:  ? ceFEPime (MAXIPIME) IV 2 g (01/13/22 1800)  ? heparin 2,000 Units/hr (01/13/22 2044)  ? vancomycin Stopped (01/13/22 1059)  ? ? ?Assessment: ?66 yo male with a history of severe PAD, R BKA, left toe amputations, DM, ESRD on HD, diabetic retinopathy, CAD, OSA, chronic neck and back pain, and AFib presents with chest pain,  emesis, and nausea. PTA the patient is on apixaban, last dose taken on 5/4 at 0800. The patient will remain  on IV anticoagulation while awaiting a decision regarding surgery. Pharmacy is consulted to dose heparin. ?  ?aPTT is slightly supratherapeutic at 105 while heparin is running at 2000 units/hr. Per the RN, there have been no issues or interruptions with the infusion and the patient is without signs or symptoms of bleeding. Hgb 9.2 platelets 165. ? ?Due to recent apixaban administration, will continue to monitor with aPTTs until the aPTT correlates with the heparin level. The patient does not have orders for heparin in dialysis due to current heparin infusion administration. ? ?Goal of Therapy:  ?Heparin level 0.3-0.7 units/ml ?aPTT 66-102 seconds ?Monitor platelets by anticoagulation protocol: Yes ?  ?Plan:  ?Decrease heparin IV to 1950 units/hr ?Obtain a 6-hour aPTT ?Monitor for signs and symptoms of bleeding ?Obtain a daily heparin level, aPTT, and CBC ? ?Shauna Hugh, PharmD, RPh  ?PGY-2 Pharmacy Resident ?01/14/2022 7:58 AM ? ?Please check [email or web address] for unit-specific pharmacy phone numbers. ? ?Addendum: The patient is discharging today, therefore, will will switch the patient back to their PTA apixaban per Dr. Cyndia Skeeters. Will discontinue the heparin IV order, and initiate apixaban PO 5 mg BID as the patient does not meet dose reduction criteria. Continue to monitor the patient for signs and symptoms of bleeding. ? ?Shauna Hugh, PharmD, RPh  ?PGY-2 Pharmacy Resident ?01/14/2022 9:27 AM ? ?Please check [email or web address] for unit-specific pharmacy phone numbers. ? ? ? ?

## 2022-01-14 NOTE — Evaluation (Signed)
Occupational Therapy Evaluation ?Patient Details ?Name: [name] ?MRN: [id number] ?DOB: Dec 03, 1955 ?Today's Date: 01/14/2022 ? ? ?History of Present Illness 66 y.o. male presented to ED 5/4 with worsening of nauseous vomiting. Found to have BP elevation, admitted for treatement of L foot ulcer and work up of gastritis versus ulcer.  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 ?Clinical Impression ?  ?Pt requires set up A at baseline for ADLs, uses w/c primarily for mobility. Pt lives with spouse who can assist at d/c, home is w/c accessible. Pt with decreased awareness/insight into deficits and safety, able to perform bed <> chair transfer with min guard as refusing therapist physical assistance. Pt set up -mod A for ADLs during session, needing increased time for fine motor task (buttoning shirt). Pt presenting with impairments listed below, will follow acutely. Recommend HHOT at d/c. ?   ? ?Recommendations for follow up therapy are one component of a multi-disciplinary discharge planning process, led by the attending physician.  Recommendations may be updated based on patient status, additional functional criteria and insurance authorization.  ? ?Follow Up Recommendations ? Home health OT  ?  ?Assistance Recommended at Discharge Intermittent Supervision/Assistance  ?Patient can return home with the following A lot of help with walking and/or transfers;A lot of help with bathing/dressing/bathroom;Assistance with cooking/housework;Help with stairs or ramp for entrance;Assist for transportation ? ?  ?Functional Status Assessment ? Patient has had a recent decline in their functional status and demonstrates the ability to make significant improvements in function in a reasonable and predictable amount of time.   ?Equipment Recommendations ? None recommended by OT;Other (comment) (pt has needed DME)  ?  ?Recommendations for Other Services   ? ? ?  ?Precautions / Restrictions Precautions ?Precautions: Fall ?Restrictions ?Weight Bearing Restrictions: No  ? ?  ? ?Mobility Bed Mobility ?  ?  ?  ?  ?  ?  ?  ?General bed mobility comments: EOB upon arrival ?  ? ?Transfers ?Overall transfer level: Needs assistance ?Equipment used: None ?Transfers: Bed to chair/wheelchair/BSC ?  ?Stand pivot transfers: Min guard ?  ?  ?  ?  ?General transfer comment: 180* turn from bed <> chair, + use of armrests and bedrails for support, refusing support from therapist ?  ? ?  ?Balance Overall balance assessment: Needs assistance ?Sitting-balance support: Feet supported, No upper extremity supported ?Sitting balance-Leahy Scale: Fair ?Sitting balance - Comments: cannot reach down towards feet without LOB, performs figure 4 ?  ?  ?  ?  ?  ?  ?  ?  ?  ?  ?  ?  ?  ?  ?  ?   ? ?ADL either performed or assessed with clinical judgement  ? ?ADL Overall ADL's : Needs assistance/impaired ?Eating/Feeding: Set up;Sitting ?  ?Grooming: Set up;Sitting ?  ?Upper Body Bathing: Set up;Sitting ?  ?Lower Body Bathing: Moderate assistance;Sitting/lateral leans ?  ?Upper Body Dressing : Set up;Sitting ?Upper Body Dressing Details (indicate cue type and reason): dons shirt, increased time for buttons/fine motor coordination ?Lower Body Dressing: Moderate assistance;Sitting/lateral leans ?  ?Toilet Transfer: Min guard;BSC/3in1 ?Toilet Transfer Details (indicate cue type and reason): simulated to chair ?  ?  ?  ?  ?Functional mobility during ADLs: Min guard ?   ? ? ? 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   ?Perception    ?  ?Praxis   ?  ? ?Pertinent Vitals/Pain Pain Assessment ?Pain Assessment: Faces ?Pain Score: 2  ?Faces Pain Scale: Hurts a little bit ?Pain Location: L knee and foot, back ?Pain Descriptors / Indicators: Grimacing, Guarding ?Pain Intervention(s): Limited activity within patient's tolerance, Monitored during session, Repositioned  ? ? ? ?Hand Dominance Right ?  ?Extremity/Trunk Assessment Upper Extremity Assessment ?Upper Extremity Assessment: Overall WFL for tasks assessed (hx of diabetic neuropathy, decreased fine motor coordination) ?  ?Lower Extremity Assessment ?Lower Extremity Assessment: Defer to PT evaluation ?RLE Deficits / Details: R BKA, prosthetic in place, hip flexion full, knee lacks ~15 degrees of extension, flexion to ~100 degrees, hip flex 5/5, knee flex 5/5, knee ext 4/5 ?LLE Deficits / Details: ROM hip flex WFL, knee lacks ~5 extension, ~100 degrees of flexion, deferred ranging ankle due to bandaging on foot ulcer, resting in plantar flexion ?LLE Sensation: decreased light touch ?  ?Cervical / Trunk Assessment ?Cervical / Trunk Assessment: Back Surgery;Neck Surgery ?  ?Communication Communication ?Communication: No difficulties ?  ?Cognition Arousal/Alertness: Awake/alert ?Behavior During Therapy: Agitated, Impulsive ?Overall Cognitive Status: Within Functional Limits for tasks assessed ?  ?  ?  ?  ?  ?  ?  ?  ?  ?  ?  ?  ?  ?  ?  ?  ?General Comments: decreased awareness of deficits and safety, poor health literacy ?  ?  ?General Comments  VSS on RA ? ?  ?Exercises   ?  ?Shoulder Instructions    ? ? ?Home Living Family/patient expects to be discharged to:: Private residence ?Living Arrangements: Spouse/significant other;Children ?Available Help at Discharge: Family;Available 24 hours/day ?Type of Home: House ?Home Access: Ramped entrance ?  ?  ?Home Layout: One level ?  ?  ?Bathroom Shower/Tub: Walk-in shower ?  ?Bathroom Toilet: Handicapped height ?Bathroom Accessibility: Yes ?  ?Home  Equipment: [employer], [employer] (2 wheels);Crutches;Cane - single point;Tub bench;Grab bars - toilet;Grab bars - tub/shower;Hand held shower head;Wheelchair - power ?  ?Additional Comments: assures PT/OT he has all necessary equipment ?  ? ?  ?Prior Functioning/Environment Prior Level of Function : Driving;Needs assist ?  ?  ?  ?  ?  ?  ?Mobility Comments: reports he does not walk due to L knee buckling, can transfer without assist to his power chair, toilet, or tub bench ?ADLs Comments: reports independence in bathing and dressing, wife provides set up ?  ? ?  ?  ?OT Problem List: Decreased strength;Decreased range of motion;Impaired balance (sitting and/or standing);Decreased activity tolerance;Decreased knowledge of use of DME or AE;Decreased safety awareness ?  ?   ?OT Treatment/Interventions: Self-care/ADL training;Therapeutic exercise;Therapeutic activities;Patient/family education;Balance training  ?  ?OT Goals(Current goals can be found in the care plan section) Acute Rehab OT Goals ?Patient Stated Goal: to go home ?OT Goal Formulation: With patient ?Time For Goal Achievement: 01/28/22 ?Potential to Achieve Goals: Fair ?ADL Goals ?Pt Will Perform Upper Body Dressing: with modified independence;sitting ?Pt Will Perform Lower Body Dressing: with min guard assist;sitting/lateral leans ?Pt Will Transfer to Toilet: bedside commode;with modified independence ?Pt Will Perform Tub/Shower Transfer: Shower transfer;Tub transfer;tub bench;with supervision  ?OT Frequency: Min 2X/week ?  ? ?Co-evaluation PT/OT/SLP Co-Evaluation/Treatment: Yes ?Reason for Co-Treatment: Complexity  of the patient's impairments (multi-system involvement);To address functional/ADL transfers;For patient/therapist safety ?  ?OT goals addressed during session: ADL's and self-care ?  ? ?  ?AM-PAC OT "6 Clicks" Daily Activity     ?Outcome Measure Help from another person eating meals?: None ?Help from another person taking care of personal grooming?:  None ?Help from another person toileting, which includes using toliet, bedpan, or urinal?: A Little ?Help from another person bathing (including washing, rinsing, drying)?: A Lot ?Help from another person to put o

## 2022-01-14 NOTE — Progress Notes (Signed)
Physical Therapy Evaluation ? ?Clinical Impression: ?PTA pt living with wife and adult developmentally disabled son in single story home with ramped entrance. Pt reports due to L foot ulcer and L knee buckling he has only been doing 180 degree pivot transfers to and from bed, wheelchair and toilet recently.  At time of Evaluation pt with gauze dressing on L foot, pt reports he does not have a surgical or protective shoe for transferring. Had extensive conversation about foot wound and need for proper protection however pt with decreased understanding. Today, pt able to perform transfer with min guard. Pt goal is to be able to stand and eventually regain ambulation. PT recommending HHPT to assist in pt meeting his goals. Pt to discharge this afternoon.  ? ? ? 01/14/22 1000  ?PT Visit Information  ?Last PT Received On 01/14/22  ?Assistance Needed +1  ?PT/OT/SLP Co-Evaluation/Treatment Yes  ?Reason for Co-Treatment Complexity of the patient's impairments (multi-system involvement)  ?PT goals addressed during session Mobility/safety with mobility  ?History of Present Illness 66 y.o. male presented to ED 5/4 with worsening of nauseous vomiting. Found to have BP elevation, admitted for treatement of L foot ulcer and work up of gastritis versus ulcer.  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,  ?Precautions  ?Precautions Fall  ?Restrictions  ?Weight Bearing Restrictions No  ?Home Living  ?Family/patient expects to be discharged to: Private residence  ?Living Arrangements Spouse/significant other;Children ?(developmentally challenged adult son)  ?Available Help at Discharge Family;Available 24 hours/day  ?Type of Home House  ?Home Access Ramped entrance  ?Home Layout One level  ?Bathroom Shower/Tub Walk-in shower   ?Bathroom Toilet Handicapped height  ?Bathroom Accessibility Yes  ?Home Equipment [employer] (2 wheels);Crutches;Cane - single point;Tub bench;Grab bars - toilet;Grab bars - tub/shower;Hand held shower head;Wheelchair - power  ?Additional Comments assures PT/OT he has all necessary equipment  ?Prior Function  ?Prior Level of Function  Driving;Needs assist  ?Mobility Comments reports he does not walk due to L knee buckling, can transfer without assist to his power chair, toilet, or tub bench  ?ADLs Comments reports independence in bathing and dressing, wife provides set up  ?Communication  ?Communication No difficulties  ?Pain Assessment  ?Pain Assessment Faces  ?Faces Pain Scale 2  ?Pain Location L knee and foot, back  ?Pain Descriptors / Indicators Grimacing;Guarding  ?Pain Intervention(s) Limited activity within patient's tolerance;Monitored during session;Repositioned  ?Cognition  ?Arousal/Alertness Awake/alert  ?Behavior During Therapy Impulsive  ?Overall Cognitive Status Within Functional Limits for tasks assessed  ?General Comments poor medical literacy and understanding of progression of disease  ?Upper Extremity Assessment  ?Upper Extremity Assessment Defer to OT evaluation  ?Lower Extremity Assessment  ?Lower Extremity Assessment RLE deficits/detail;LLE deficits/detail  ?RLE Deficits / Details R BKA, prosthetic in place, hip flexion full, knee lacks ~15 degrees of extension, flexion to ~100 degrees, hip flex 5/5, knee flex 5/5, knee ext 4/5  ?LLE Deficits / Details ROM hip flex WFL, knee lacks ~5 extension, ~100 degrees of flexion, deferred ranging ankle due to bandaging on foot ulcer, resting in plantar flexion  ?LLE Sensation decreased light touch  ?Cervical / Trunk Assessment  ?Cervical / Trunk Assessment Back Surgery;Neck Surgery  ?Bed Mobility  ?General bed mobility comments sitting EoB on entry  ?Transfers  ?Overall transfer level Needs assistance  ?Equipment  used None  ?Transfers Bed to  chair/wheelchair/BSC  ?Bed to/from chair/wheelchair/BSC transfer type: Stand pivot  ?Stand pivot transfers Min guard  ?General transfer comment min guard for 180*pivot transfer from bed to recliner facing bed, and back, pt refusing any assist or close guard for safety  ?Ambulation/Gait  ?General Gait Details refuses to attempt due to hx of L knee buckle  ?Balance  ?Overall balance assessment Needs assistance  ?Sitting-balance support Feet supported;No upper extremity supported  ?Sitting balance-Leahy Scale Fair  ?Sitting balance - Comments able to sit without support for short bouts however prefers to  ?Standing balance comment unable to come to full standing balance  ?General Comments  ?General comments (skin integrity, edema, etc.) VSS on RA  ?PT - End of Session  ?Activity Tolerance Patient tolerated treatment well  ?Patient left in bed;with call bell/phone within reach;Other (comment) ?(EoB with OT in room)  ?Nurse Communication Mobility status  ?PT Assessment  ?PT Recommendation/Assessment Patient needs continued PT services  ?PT Visit Diagnosis Unsteadiness on feet (R26.81);Muscle weakness (generalized) (M62.81);Difficulty in walking, not elsewhere classified (R26.2);Other abnormalities of gait and mobility (R26.89);Pain  ?PT Problem List Decreased strength;Decreased range of motion;Decreased activity tolerance;Decreased balance;Decreased mobility;Decreased coordination;Decreased cognition;Decreased safety awareness;Decreased skin integrity;Pain;Impaired sensation  ?PT Plan  ?PT Frequency (ACUTE ONLY) Min 3X/week  ?PT Treatment/Interventions (ACUTE ONLY) DME instruction;Gait training;Functional mobility training;Therapeutic activities;Therapeutic exercise;Balance training;Neuromuscular re-education;Cognitive remediation;Patient/family education  ?AM-PAC PT "6 Clicks" Mobility Outcome Measure (Version 2)  ?Help needed turning from your back to your side while in a flat bed without using bedrails? 4  ?Help  needed moving from lying on your back to sitting on the side of a flat bed without using bedrails? 4  ?Help needed moving to and from a bed to a chair (including a wheelchair)? 4  ?Help needed standing up from a chair using your arms (e.g., wheelchair or bedside chair)? 2  ?Help needed to walk in hospital room? 1  ?Help needed climbing 3-5 steps with a railing?  1  ?6 Click Score 16  ?Consider Recommendation of Discharge To: Home with HH  ?Progressive Mobility  ?What is the highest level of mobility based on the progressive mobility assessment? Level 3 (Stands with assist) - Balance while standing  and cannot march in place  ?Activity Transferred from bed to chair;Transferred from chair to bed  ?PT Recommendation  ?Follow Up Recommendations Home health PT  ?Assistance recommended at discharge Intermittent Supervision/Assistance  ?Patient can return home with the following A little help with walking and/or transfers;A little help with bathing/dressing/bathroom;Assistance with cooking/housework;Direct supervision/assist for medications management;Direct supervision/assist for financial management;Assist for transportation;Help with stairs or ramp for entrance  ?Functional Status Assessment Patient has had a recent decline in their functional status and demonstrates the ability to make significant improvements in function in a reasonable and predictable amount of time.  ?PT equipment None recommended by PT  ?Individuals Consulted  ?Consulted and Agree with Results and Recommendations Patient  ?Acute Rehab PT Goals  ?Patient Stated Goal get back to walking  ?PT Goal Formulation With patient  ?Time For Goal Achievement 01/28/22  ?Potential to [place]  ?PT Time Calculation  ?PT Start Time (ACUTE ONLY) 1015  ?PT Stop Time (ACUTE ONLY) 1040  ?PT Time Calculation (min) (ACUTE ONLY) 25 min  ?PT General Charges  ?$$ ACUTE PT VISIT 1 Visit  ?PT Evaluation  ?$PT Eval Moderate Complexity 1 Mod  ?PT Treatments   ?$Therapeutic Activity 8-22 mins  ?Written Expression  ?Dominant Hand Right  ? ?  Kaedyn Polivka B. Migdalia Dk PT, DPT ?Acute Rehabilitation Services ?Please use secure chat or  ?Call Office [phone] ? ?

## 2022-01-14 NOTE — Discharge Summary (Signed)
? ?Physician Discharge Summary  ?DODGE ATOR PZW:[id number] DOB: [birth year] DOA: 01/11/2022 ? ?PCP: [name], PA-C ? ?Admit date: 01/11/2022 ?Discharge date: 01/14/2022 ?Admitted From: Home ?Disposition: Home ?Recommendations for Outpatient Follow-up:  ?Follow ups as below. ?Please obtain CBC/BMP/Mag at follow up ?Please follow up on the following pending results: None ? ?Home Health: PT/OT/RN ?Equipment/Devices: Patient has wheelchair ? ?Discharge Condition: Stable ?CODE STATUS: Full code ? Follow-up Information   ? ? Newt Minion, MD Follow up in 1 week(s).   ?Specialty: Orthopedic Surgery ?Contact information: ?[address] ?[place] [address] ?[phone] ? ? ?  ?  ? ? [name], PA-C. Schedule an appointment as soon as possible for a visit in 1 week(s).   ?Specialty: Physician Assistant ?Contact information: ?[address] ?[place] [address] ?[phone] ? ? ?  ?  ? ?  ?  ? ?  ? ? ?Hospital course ?66 y.o. male with PMH of chronic left foot ischemic ulcer infection and osteo s/p 6 weeks of IV antibiotics currently on doxycycline followed by 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  ? ?CT abdomen and pelvis without acute finding to explain patient's symptoms.  Left foot x-ray with lateral soft tissue ulceration and concern for underlying chronic osteomyelitis.  Started on IV vancomycin and cefepime.  Orthopedic surgery consulted.   ? ?MRI left foot without contrast with continued soft tissue ulceration of lateral foot and improved osteomyelitis of the lateral cuboid and resolved osteomyelitis of the fourth metatarsal base, and no abscess.  Orthopedic surgery recommended TMT amputation but patient prefers trial of IV antibiotics.  Infectious disease consulted and recommended continuing IV vancomycin and cefepime  with hemodialysis until 01/25/2022.  Patient to follow-up with [name] outpatient.  Home health PT/OT/RN ordered. ? ?On the day of discharge, vomiting resolved.  Nausea improved.  He is discharged on p.o. Reglan.  We have increased p.o. Protonix to 40 mg twice daily for 1 months followed by once daily.  ? ?See individual problem list below for more on hospital course. ? 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 doxycycline.  He has underlying PVD for which he underwent multiple intervention including recent PCI of left tibial artery in 06/2021.  Imaging with persistent left lateral foot ulceration and osteomyelitis.  Patient deferred surgical intervention/TMT amputation.  Started on IV cefepime and vancomycin.  CRP 3.6.  ESR 40. ?-ID consulted and recommended continuing IV cefepime and vancomycin until 5/18. ?-Dry to wet dressing daily ?-Outpatient follow-up with orthopedic surgery ? ?Intractable nausea and vomiting with epigastric pain ?Has epigastric pain and tenderness which suggested some element of gastritis.  He carries history of Barrett's esophagus without dysplasia.  He could also have diabetic gastropathy from uncontrolled diabetes.  Vomiting and epigastric pain resolved.  Nausea improved. ?-Increase Protonix to 40 mg  twice daily.  Discontinued Pepcid. ?-Reglan 5 mg ACHS ?-Use Zofran for refractory nausea and vomiting ? ?PAD (peripheral artery disease) ([place]) ?Followed by pulm and cardiology.  Had multiple interventions of left leg including most recent PCI.  ?-Cardiology recommends medical management ? ?CAD (dz of distal, mid and proximal RCA with implantation of 3 overlapping drug-eluting stent,) ?No chest pain.  Continue home statin and anticoagulation ? ?Obesity (BMI 30-39.9) ?Body mass index is 33.32 kg/m?. ?-Encourage lifestyle change to lose weight ? ?Anemia of renal disease ?Recent Labs  ?  09/02/21 ?1718 09/03/21 ?0122 09/04/21 ?0351 09/05/21 ?0151 09/06/21 ?0126 09/07/21 ?0238 01/11/22 ?8676 01/12/22 ?7209 01/13/22 ?4709 01/14/22 ?0111  ?HGB 12.2* 9.2* 10.4* 10.2* 11.9* 11.1* 10.0* 12.5* 10.6* 9.2*  ?H&H seems to be at baseline. ?Recheck CBC at follow-up. ? ? ?Physical deconditioning ?HH PT/OT/RN ordered. ?Patient has wheelchair at home ? ?Atrial fibrillation, chronic ([place]) ?Continue home amiodarone and Eliquis ? ?ESRD on hemodialysis ([place]) ?Missed regular HD on 5/2 and 5/4 due to nausea/vomiting and weakness. ?-Had HD on 5/4 and 5/6 in the hospital. ?-Cleared for discharge by nephrology ? ?OSA not tolerating CPAP ?No hypoxia. ? ?Essential hypertension ?Normotensive for most part. ?-Continue home amlodipine ? ?Chronic diastolic heart failure, NYHA class 2 ([place]) ?TTE in 06/2021 with LVEF of 55 to 60% and indeterminate DD.  Appears euvolemic on exam.  No cardiopulmonary symptoms.  Makes some urine as well. ?-Fluid management by hemodialysis ? ? ?Vital signs ?Vitals:  ? 01/13/22 1216 01/13/22 1531 01/13/22 1919 01/14/22 0447  ?BP: (!) 137/57 (!) 129/55 (!) 121/53 (!) 141/59  ?Pulse: 70 75 76 65  ?Temp: 97.8 ?F (36.6 ?C) 99.2 ?F (37.3 ?C) 99 ?F (37.2 ?C) 97.8 ?F (36.6 ?C)  ?Resp: 18 18 18 18   ?Height:      ?Weight:      ?SpO2: 100% 100% 98% 100%  ?TempSrc: Oral Oral Oral Oral  ?BMI (Calculated):      ?  ? ?Discharge  exam ? ?GENERAL: No apparent distress.  Nontoxic. ?HEENT: MMM.  Vision and hearing grossly intact.  ?NECK: Supple.  No apparent JVD.  ?RESP:  No IWOB.  Fair aeration bilaterally. ?CVS:  RRR. Heart sounds normal.  ?ABD/GI/GU: BS+. Abd soft, NTND.  ?MSK/EXT: Right BKA.  Ulceration and slight swelling over left lateral foot. ?SKIN: no apparent skin lesion or wound ?NEURO: Awake and alert. Oriented appropriately.  No apparent focal neuro deficit. ?PSYCH: Calm. Normal affect.  ? ?Discharge Instructions ?Discharge Instructions   ? ? Call MD for:  extreme fatigue   Complete by: As directed ?  ? Call MD for:  persistant dizziness or light-headedness   Complete by: As directed ?  ? Call MD for:  persistant nausea and vomiting   Complete by: As directed ?  ? Call MD for:  redness, tenderness, or signs of infection (pain, swelling, redness, odor or green/yellow discharge around incision site)   Complete by: As directed ?  ? Call MD for:  temperature >100.4   Complete by: As directed ?  ? Diet - low sodium heart healthy   Complete by: As directed ?  ? Discharge instructions   Complete by: As directed ?  ? It has been a pleasure taking care of you! ? ?You were hospitalized due to nausea, vomiting and foot infection.  Your nausea and vomiting improved with medications.  We have increased your Protonix to twice a day for 1 month.  We have also started you on Reglan for nausea and vomiting.  please review your new medication list and the directions on your medications before you take them. Note that combination of pain medications such as Percocet, fentanyl and Lyrica could increase your risk of nausea and vomiting.  We recommend cutting down on this medication or discussing with your prescriber. ? ?In regards to left foot infection, you will continue IV antibiotics with dialysis for 2 weeks.  Follow-up with [name].  ? ?Please review your new medication list and the directions on your medications before you take them. ? ? ? ?Take  care,  ? Discharge wound care:   Complete by: As directed ?  ? Wet-to-dry dressing daily.  Follow up with [name].  ? Increase activity slowly   Complete by: As directed ?  ? ?  ? ?Allergies as of 5/7/202

## 2022-01-14 NOTE — Progress Notes (Signed)
Discharge instructions reviewed with pt and instructed on where to pick up prescriptions. Pt verbalized understanding and questions answered.  Pt discharged in stable condition via wheelchair with son. ? ?[name] ? ?

## 2022-01-14 NOTE — TOC Transition Note (Addendum)
Transition of Care (TOC) - CM/SW Discharge Note ? ? ?Patient Details  ?Name: [name] ?MRN: [id number] ?Date of Birth: 04/13/1956 ? ?Transition of Care (TOC) CM/SW Contact:  ?Harold Crews, RN ?Phone Number: [phone] ?01/14/2022, 3:32 PM ? ? ?Clinical Narrative:    ? ?Spoke with patient at the bedside tod discuss post acute transition. Demographics verified. PTA home with wife and disabled son. [employer] transportation on Tuesdays and Thursdays for dialysis transportation, and [employer] on Saturdays. [facility] transportation assists with medical appointments unless his son, [name], is available. He stated that [name] lives 2-3 miles away. Has all needed DME including electric wheelchair that is old, manual wheelchair that is old, ramp, grab bars, tub bench, and walker. He has a prosthetic for his R BKA.  ? ?Discussed barriers for finding home health. Patient expressed no preferences. He previously has followed up with Dr. Sharol [name] weekly or twice weekly for dressing changes. Patient reports that he can arrange a teachable caregiver if able to get [facility] nursing.  ? ?Unable to accept: [facility] (out of network); [facility]; [facility] (no nursing availability),  [facility] (out of network), [facility], [facility] (out of area), [facility] (out of network) ? ?Referral pending response: [facility]  ? ?[facility] is reviewing case. Clinical notes faxed to [phone]. If able to accept, nursing would be able to start care until Wednesday 5/10.  ? ?Patient verbalized understanding to follow up with Dr. Sharol [name] for wound care regardless of [facility].  ? ?Patient to transition home today. He stated his son will pick him up.  ? ?Update: Spoke with patient about potential home health option with start of care on Wednesday. Advised to do dressing change at home and follow up with Dr. Sharol [name] as directed. Received follow up response from [facility] who declined d/t out of network; [facility] declined d/t unable to staff at  this time.  ? ?Final next level of care: [place] ?Barriers to Discharge: No Barriers Identified ? ? ?Patient Goals and CMS Choice ?Patient states their goals for this hospitalization and ongoing recovery are:: return home with family support ?CMS Medicare.gov Compare Post Acute Care list provided to:: Patient ?Choice offered to / list presented to : Patient ? ?Discharge Placement ?  ?           ?  ?  ?  ?  ? ?Discharge Plan and Services ?  ?Discharge Planning Services: CM Consult ?Post Acute Care Choice: Home Health          ?DME Arranged: N/A ?DME Agency: NA ?  ?  ?  ?HH Arranged: RN, PT, OT ?[place] Agency: [facility] ?Date [place]: 01/14/22 ?Time [place]: 1601 ?Representative spoke with at [place]: Abigail Butts - review pending, if able to accept, nursing start of care would be Wednesday ? ?Social Determinants of Health (SDOH) Interventions ?  ? ? ?Readmission Risk Interventions ? ?  06/30/2021  ? 11:53 AM  ?Readmission Risk Prevention Plan  ?Transportation Screening Complete  ?Medication Review Press photographer) Complete  ?[place] or Home Care Consult Complete  ?SW Recovery Care/Counseling Consult Complete  ?Palliative Care Screening Not Applicable  ?[place] Not Applicable  ? ? ? ? ? ?

## 2022-01-14 NOTE — TOC Transition Note (Signed)
Transition of Care (TOC) - CM/SW Discharge Note ? ? ?Patient Details  ?Name: PATTON RABINOVICH ?MRN: [id number] ?Date of Birth: 11-Aug-1956 ? ?Transition of Care (TOC) CM/SW Contact:  ?Bartholomew Crews, RN ?Phone Number: [phone] ?01/14/2022, 9:40 AM ? ? ?Clinical Narrative:    ? ?Transition home today. No TOC needs identified at this time. ? ?Final next level of care: Home/Self Care ?Barriers to Discharge: No Barriers Identified ? ? ?Patient Goals and CMS Choice ?  ?  ?  ? ?Discharge Placement ?  ?           ?  ?  ?  ?  ? ?Discharge Plan and Services ?  ?  ?           ?  ?  ?  ?  ?  ?  ?  ?  ?  ?  ? ?Social Determinants of Health (SDOH) Interventions ?  ? ? ?Readmission Risk Interventions ? ?  06/30/2021  ? 11:53 AM  ?Readmission Risk Prevention Plan  ?Transportation Screening Complete  ?Medication Review Press photographer) Complete  ?[place] or Home Care Consult Complete  ?SW Recovery Care/Counseling Consult Complete  ?Palliative Care Screening Not Applicable  ?[place] Not Applicable  ? ? ? ? ? ?

## 2022-01-14 NOTE — Progress Notes (Signed)
[name] ?Progress Note ? ?Subjective: seen in room. 4 l off w/ HD yesterday. Post wt 117kg. Had long discussion about fluid intake and chronic vol overload (suspected) for this patient. Says he has cut back on fluid intake by about 1/2 since starting HD but still drinks about 60 oz , which is 2 times recommended intake.  ?  ?Vitals:  ? 01/13/22 1216 01/13/22 1531 01/13/22 1919 01/14/22 0447  ?BP: (!) 137/57 (!) 129/55 (!) 121/53 (!) 141/59  ?Pulse: 70 75 76 65  ?Resp: '18 18 18 18  '$ ?Temp: 97.8 ?F (36.6 ?C) 99.2 ?F (37.3 ?C) 99 ?F (37.2 ?C) 97.8 ?F (36.6 ?C)  ?TempSrc: Oral Oral Oral Oral  ?SpO2: 100% 100% 98% 100%  ?Weight:      ?Height:      ? ? ?Exam: ?Gen alert, no distress ?No jvd or bruits ?Chest clear bilat to bases ?RRR no RG ?Abd soft ntnd no mass or ascites +bs ?Ext 2+ LLE edema, R BKA w/ prosthetic in place ?Neuro is alert, Ox 3 , nf ?   LUE AVF +bruit ?  ?  ?  ?  ? Home meds include - amiodarone, norvasc 5, lipitor, pepcid, duragesic patch, humalog+ lantus insulin, midodrine 10 mg pre hd tiw, percocet, protonix, lyrica 100 bid, requip 2, flomax, prns/ vits/ supps ?  ?  ? CXR 5/04 - IMPRESSION: No appreciable airspace consolidation or pulmonary edema. ?  ? OP HD: TTS [place] ? 4h 53mn   110kg   450/800      2/2 bath  LUE AVF   Hep none ? - last HD 4/29, 117 > 115 kg ? - last Hb 11.4 on 4/25 ? - venofer weekly '50mg'$  ? - hectorol 2 ug tiw IV ?  ?  ?Assessment/ Plan: ?Infected L foot DFU w/ chronic osteomyelitis - had prior 6 wks course of IV abx w/ HD completed Feb 2023.  Then was on po doxy per Ortho the last 2 mos. Here was started on IV vanc/ cefepime and ID is consulting.  ?Nausea/ vomiting - sig better, per pmd.  ?ESRD - on HD TTS. Missed 1 op HD, back on schedule had HD here on Fri and Sat. Next HD on 5/09.  ?BP - on norvasc 5 + midodrine 10 pre HD tiw. BP's are down/ better, in normal range.  ?Volume overload - +edema L leg/hip mostly. CXR neg. 7.5 L off w/ HD x 2 here. Still up 3-4 kg  still (prosthetic is around 2.6kg per pt). Had long discussion about further cutting back on his fluid intake.  ?Anemia esrd - Hb 10 here, holding weekly IV Fe w/ infection. Transfuse prn. Not on esa at OP unit.  ?MBD ckd - CCa and phos in range. Cont IV vdra w/ hd.  ?PAF - on amio, eliquis switched to IV heparin here.  ?Dispo - OK for dc from renal standpoint ?  ?[name] ?01/14/2022, 7:11 AM ? ? ? ?Recent Labs  ?Lab 01/11/22 ?1638 01/12/22 ?0723 01/13/22 ?0408105/07/23 ?0111  ?HGB  --    < > 10.6* 9.2*  ?ALBUMIN  --   --  3.0* 2.6*  ?CALCIUM  --    < > 8.1* 8.0*  ?PHOS 5.5*  --   --  3.5  ?CREATININE  --    < > 5.92* 3.94*  ?K  --    < > 4.1 4.0  ? < > = values in this interval not displayed.  ? ? ?  Inpatient medications: ? amiodarone  200 mg Oral Daily  ? amLODipine  5 mg Oral Daily  ? atorvastatin  40 mg Oral QHS  ? Chlorhexidine Gluconate Cloth  6 each Topical Q0600  ? doxercalciferol  2 mcg Intravenous Q T,Th,Sa-HD  ? fentaNYL  1 patch Transdermal B63A  ? folic acid  1 mg Oral Daily  ? insulin aspart  0-6 Units Subcutaneous TID WC  ? insulin aspart  4 Units Subcutaneous TID WC  ? insulin glargine-yfgn  10 Units Subcutaneous QHS  ? loratadine  10 mg Oral Daily  ? magic mouthwash w/lidocaine  5 mL Oral QID  ? midodrine  10 mg Oral Q T,Th,Sa-HD  ? nitroGLYCERIN  0.2 mg Transdermal Daily  ? omega-3 acid ethyl esters  2 g Oral BID  ? pantoprazole  40 mg Oral BID  ? pregabalin  100 mg Oral BID  ? rOPINIRole  2 mg Oral QHS  ? tamsulosin  0.4 mg Oral Daily  ? ? ceFEPime (MAXIPIME) IV 2 g (01/13/22 1800)  ? heparin 2,000 Units/hr (01/13/22 2044)  ? vancomycin Stopped (01/13/22 1059)  ? ?hydrALAZINE, linaclotide, metoCLOPramide (REGLAN) injection, ondansetron (ZOFRAN) IV, oxyCODONE-acetaminophen ? ? ? ? ? ? ?

## 2022-01-15 ENCOUNTER — Ambulatory Visit (INDEPENDENT_AMBULATORY_CARE_PROVIDER_SITE_OTHER): Payer: 59 | Admitting: Ophthalmology

## 2022-01-15 ENCOUNTER — Encounter (INDEPENDENT_AMBULATORY_CARE_PROVIDER_SITE_OTHER): Payer: Self-pay | Admitting: Ophthalmology

## 2022-01-15 ENCOUNTER — Encounter: Payer: Self-pay | Admitting: Physician Assistant

## 2022-01-15 ENCOUNTER — Encounter (INDEPENDENT_AMBULATORY_CARE_PROVIDER_SITE_OTHER): Payer: 59 | Admitting: Ophthalmology

## 2022-01-15 DIAGNOSIS — E113512 Type 2 diabetes mellitus with proliferative diabetic retinopathy with macular edema, left eye: Secondary | ICD-10-CM | POA: Diagnosis not present

## 2022-01-15 DIAGNOSIS — E113511 Type 2 diabetes mellitus with proliferative diabetic retinopathy with macular edema, right eye: Secondary | ICD-10-CM

## 2022-01-15 DIAGNOSIS — E113412 Type 2 diabetes mellitus with severe nonproliferative diabetic retinopathy with macular edema, left eye: Secondary | ICD-10-CM

## 2022-01-15 DIAGNOSIS — H4311 Vitreous hemorrhage, right eye: Secondary | ICD-10-CM

## 2022-01-15 NOTE — Assessment & Plan Note (Signed)
Follow-up OD next as scheduled and probable injection Avastin ?

## 2022-01-15 NOTE — Assessment & Plan Note (Signed)
PRP completion superior quadrants and temporally.  We will finally need PRP inferiorly OS another day ?

## 2022-01-15 NOTE — Assessment & Plan Note (Signed)
>>  ASSESSMENT AND PLAN FOR VITREOUS HEMORRHAGE OF RIGHT EYE (HCC) WRITTEN ON 01/15/2022  3:49 PM BY ELNER KUBA A, MD  Less vitreous hemorrhage OD today

## 2022-01-15 NOTE — Assessment & Plan Note (Signed)
Less vitreous hemorrhage OD today ?

## 2022-01-15 NOTE — Progress Notes (Signed)
? ? ?01/15/2022 ? ?  ? ?CHIEF COMPLAINT ?Patient presents for  ?Chief Complaint  ?Patient presents with  ? Diabetic Retinopathy with Macular Edema  ? ? ? ? ?HISTORY OF PRESENT ILLNESS: ?[name] is a 66 y.o. male who presents to the clinic today for:  ? ?HPI   ?2 weeks dilate OS, PRP. ?Patient states vision is a little better in the right eye today. ?Patient states left eye is stable. No new FOL or floaters. ?Last edited by Laurin Coder on 01/15/2022  3:24 PM.  ?  ? ? ?Referring physician: ?[name], PA-C ?[address] ?[place],  [place] [address] ? ?HISTORICAL INFORMATION:  ? ?Selected notes from the [facility] ?  ? ?Lab Results  ?Component Value Date  ? HGBA1C 7.6 (H) 01/11/2022  ?  ? ?CURRENT MEDICATIONS: ?Current Outpatient Medications (Ophthalmic Drugs)  ?Medication Sig  ? Polyvinyl Alcohol-Povidone PF 1.4-0.6 % SOLN Place 1 drop into both eyes 2 (two) times daily as needed (dry eyes).   ? ?No current facility-administered medications for this visit. (Ophthalmic Drugs)  ? ?Current Outpatient Medications (Other)  ?Medication Sig  ? amiodarone (PACERONE) 200 MG tablet Take 1 tablet (200 mg total) by mouth daily. Two (2) tablets for 10 days then one tab daily  ? amLODipine (NORVASC) 5 MG tablet Take 5 mg by mouth daily.  ? apixaban (ELIQUIS) 5 MG TABS tablet Take 1 tablet (5 mg total) by mouth 2 (two) times daily.  ? atorvastatin (LIPITOR) 40 MG tablet Take 40 mg by mouth at bedtime.   ? B Complex-C-Zn-Folic Acid (DIALYVITE/ZINC) TABS Take 1 tablet by mouth daily.  ? [START ON 01/16/2022] ceFEPIme 2 g in sodium chloride 0.9 % 100 mL Inject 2 g into the vein every Tuesday, Thursday, and Saturday at 6 PM for 9 days.  ? fentaNYL (DURAGESIC) 50 MCG/HR Place 1 patch onto the skin every 3 (three) days.  ? folic acid (FOLVITE) 1 MG tablet Take 1 mg by mouth daily.  ? folic acid-vitamin b complex-vitamin c-selenium-zinc (DIALYVITE) 3 MG TABS tablet Take 1 tablet by mouth daily.  ? Glucosamine HCl  (GLUCOSAMINE PO) Take 1 tablet by mouth 2 (two) times daily.  ? HUMALOG KWIKPEN 100 UNIT/ML KwikPen Inject 4 Units into the skin 3 (three) times daily.  ? insulin glargine (LANTUS) 100 UNIT/ML injection Inject 0.1 mLs (10 Units total) into the skin at bedtime. (Patient taking differently: Inject 30 Units into the skin at bedtime.)  ? ketoconazole (NIZORAL) 2 % cream Apply 1 application topically 2 (two) times daily as needed for irritation.   ? lidocaine-prilocaine (EMLA) cream Apply 1 application topically See admin instructions. Prior to Dialysis days Tuesday,Thursday and saturday  ? linaclotide (LINZESS) 145 MCG CAPS capsule Take 145 mcg by mouth daily as needed (constipation).   ? loratadine (CLARITIN) 10 MG tablet Take 10 mg by mouth daily.  ? metoCLOPramide (REGLAN) 5 MG tablet Take 1 tablet (5 mg total) by mouth 4 (four) times daily -  before meals and at bedtime.  ? midodrine (PROAMATINE) 10 MG tablet Take 1 tablet (10 mg total) by mouth every other day. One hour before dialysis  ? NEEDLE, REUSABLE, 22 G 22G X 1-1/2" MISC 1 Units by Does not apply route as directed. For B12 IM inj  ? nitroGLYCERIN (NITRODUR - DOSED IN MG/24 HR) 0.2 mg/hr patch Place 1 patch (0.2 mg total) onto the skin daily.  ? nitroGLYCERIN (NITROSTAT) 0.4 MG SL tablet Place 0.4 mg  under the tongue every 5 (five) minutes as needed for chest pain.  ? omega-3 acid ethyl esters (LOVAZA) 1 g capsule Take 2 g by mouth 2 (two) times daily.  ? ondansetron (ZOFRAN) 4 MG tablet Take 4 mg by mouth every 6 (six) hours as needed for nausea or vomiting.  ? oxyCODONE-acetaminophen (PERCOCET) 7.5-325 MG tablet Take 1 tablet by mouth 3 (three) times daily as needed for moderate pain.  ? pantoprazole (PROTONIX) 40 MG tablet Take 1 tablet (40 mg total) by mouth 2 (two) times daily for 30 days, THEN 1 tablet (40 mg total) daily.  ? pregabalin (LYRICA) 100 MG capsule Take 100 mg by mouth 2 (two) times daily.  ? rOPINIRole (REQUIP) 2 MG tablet Take 2 mg by  mouth at bedtime.  ? sevelamer carbonate (RENVELA) 800 MG tablet Take 800 mg by mouth 3 (three) times daily.  ? silver sulfADIAZINE (SILVADENE) 1 % cream Apply topically daily.  ? Syringe/Needle, Disp, (SYRINGE 3CC/22GX1-1/2") 22G X 1-1/2" 3 ML MISC 1 Syringe by Does not apply route as directed. For b12 IM inj  ? tamsulosin (FLOMAX) 0.4 MG CAPS capsule Take 0.4 mg by mouth daily.  ? [START ON 01/16/2022] vancomycin (VANCOCIN) 1-5 GM/200ML-% SOLN Inject 200 mLs (1,000 mg total) into the vein Every Tuesday,Thursday,and Saturday with dialysis for 9 days.  ? ?No current facility-administered medications for this visit. (Other)  ? ? ? ? ?REVIEW OF SYSTEMS: ? ? ? ?ALLERGIES ?Allergies  ?Allergen Reactions  ? Contrast Media [Iodinated Contrast Media] Shortness Of Breath and Other (See Comments)  ?  Difficulty breathing and altered mental status ? ?  ? Ivp Dye [Iodinated Contrast Media] Anaphylaxis, Shortness Of Breath and Other (See Comments)  ?  Breathing problems, altered mental state ?  ? Adhesive [Tape] Rash and Other (See Comments)  ?  Rash after 1 day of use  ? Latex Rash and Other (See Comments)  ?  A severe rash appears after the first 24 hours of being placed  ? ? ?PAST MEDICAL HISTORY ?Past Medical History:  ?Diagnosis Date  ? Carotid artery occlusion 11/10/10  ? LEFT CAROTID ENDARTERECTOMY  ? Chronic kidney disease   ? Complication of anesthesia   ? BP WENT UP AT [facility] "  ? COPD (chronic obstructive pulmonary disease) ([place])   ? pt denies this dx as of 06/01/20 - no inhaler   ? Diabetes mellitus without complication ([place])   ? Diverticulitis   ? Diverticulosis of colon (without mention of hemorrhage)   ? DJD (degenerative joint disease)   ? knees/hands/feet/back/neck  ? Fatty liver   ? Full dentures   ? GERD (gastroesophageal reflux disease)   ? H/O hiatal hernia   ? History of blood transfusion   ? with a past surical procedure per patient 06/01/20  ? Hyperlipidemia   ? Hypertension   ? Neuromuscular disorder ([place])   ?  peripheral neuropathy  ? Non-pressure chronic ulcer of other part of left foot limited to breakdown of skin ([place]) 11/12/2016  ? Osteomyelitis ([place])   ? left 5th metatarsal  ? PAD (peripheral artery disease) ([place])   ? Distal aortogram June 2012. Atherectomy left popliteal artery July 2012.   ? Pseudoclaudication 11/15/2018  ? Sleep apnea   ? pt denies this dx as of 06/01/20  ? Slurred speech   ? AS PER WIFE IN D/C NOTE 11/10/10  ? 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 Unstable angina ([place]) 09/16/2018  ? Wears glasses   ? ?Past  Surgical History:  ?Procedure Laterality Date  ? ABDOMINAL AORTOGRAM W/LOWER EXTREMITY N/A 06/23/2021  ? Procedure: ABDOMINAL AORTOGRAM W/LOWER EXTREMITY;  Surgeon: Nigel Mormon, MD;  Location: [facility];  Service: Cardiovascular;  Laterality: N/A;  ? AMPUTATION  11/05/2011  ? Procedure: AMPUTATION RAY;  Surgeon: Wylene Simmer, MD;  Location: [facility];  Service: Orthopedics;  Laterality: Right;  Amputation of Right 4&5th Toes  ? AMPUTATION Left 11/26/2012  ? Procedure: AMPUTATION RAY;  Surgeon: Wylene Simmer, MD;  Location: [facility];  Service: Orthopedics;  Laterality: Left;  fourth ray amputation  ? AMPUTATION Right 08/27/2014  ? Procedure: Transmetatarsal Amputation;  Surgeon: Newt Minion, MD;  Location: [facility];  Service: Orthopedics;  Laterality: Right;  ? AMPUTATION Right 01/14/2015  ? Procedure: AMPUTATION BELOW KNEE;  Surgeon: Newt Minion, MD;  Location: [facility];  Service: Orthopedics;  Laterality: Right;  ? AMPUTATION Left 10/21/2015  ? Procedure: Left Foot 5th Ray Amputation;  Surgeon: Newt Minion, MD;  Location: [facility];  Service: Orthopedics;  Laterality: Left;  ? ANTERIOR FUSION CERVICAL SPINE  02/06/06  ? C4-5, C5-6, C6-7; SURGEON DR. MAX COHEN  ? AV FISTULA PLACEMENT Left 06/02/2020  ? Procedure: ARTERIOVENOUS (AV) FISTULA CREATION LEFT;  Surgeon: Waynetta Sandy, MD;  Location: [facility];  Service: Vascular;  Laterality: Left;  ? BACK SURGERY    ? x 3  ? BASCILIC VEIN TRANSPOSITION Left  07/21/2020  ? Procedure: LEFT UPPER ARM ATERIOVENOUS SUPERFISTULALIZATION;  Surgeon: Waynetta Sandy, MD;  Location: [facility];  Service: Vascular;  Laterality: Left;  ? BELOW KNEE LEG AMPUTATION Right   ? CA

## 2022-01-16 LAB — CULTURE, BLOOD (ROUTINE X 2): Culture: NO GROWTH

## 2022-01-17 ENCOUNTER — Telehealth: Payer: Self-pay

## 2022-01-17 ENCOUNTER — Encounter (HOSPITAL_COMMUNITY): Payer: Self-pay

## 2022-01-17 NOTE — Telephone Encounter (Signed)
Cary with [facility] home [facility] would like wound care orders for patient?  Cb#  [phone].  Please advise.  Thank you. ?

## 2022-01-17 NOTE — Telephone Encounter (Signed)
Called and sw HHn to advise and pt has f/u 01/22/22 ?

## 2022-01-17 NOTE — Telephone Encounter (Signed)
Pt was in the office 01/08/2022 was admitted to the hospital 01/11/2022 for dm foot infection. HH is calling asking for wound care orders. Ok to advise silvercell and profore dressing to change weekly and follow up in the office next week?  ?

## 2022-01-19 ENCOUNTER — Telehealth: Payer: Self-pay | Admitting: Orthopedic Surgery

## 2022-01-19 NOTE — Telephone Encounter (Signed)
I called patient. He was finally able to take zofran to help with nausea and was able to get his BP meds down. patient advised needed to contact PCP or proceed to ER with BP readings that high if unable to get BP down. Denies Chest pain, SOB, fevers, chills ?

## 2022-01-19 NOTE — Telephone Encounter (Signed)
Patient is having nausea and vomiting BP is 220/86 and he has not taken his medication for BP because of the nausea, if call back is needed call [phone] [facility].  ?

## 2022-01-22 ENCOUNTER — Encounter: Payer: Self-pay | Admitting: Orthopedic Surgery

## 2022-01-22 ENCOUNTER — Ambulatory Visit (INDEPENDENT_AMBULATORY_CARE_PROVIDER_SITE_OTHER): Payer: 59 | Admitting: Orthopedic Surgery

## 2022-01-22 DIAGNOSIS — L97521 Non-pressure chronic ulcer of other part of left foot limited to breakdown of skin: Secondary | ICD-10-CM | POA: Diagnosis not present

## 2022-01-22 DIAGNOSIS — I87332 Chronic venous hypertension (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: Secondary | ICD-10-CM

## 2022-01-22 NOTE — Progress Notes (Signed)
? ?Office Visit Note ?  ?Patient: [name]           ?Date of Birth: Dec 29, 1955           ?MRN: [id number] ?Visit Date: 01/22/2022 ?             ?Requested by: [name], PA-C ?[address] ?[place],  [place] [address] ?PCP: [name], PA-C ? ?Chief Complaint  ?Patient presents with  ? Left Foot - Wound Check  ? ? ? ? ?HPI: ?Patient is a 66 year old gentleman who presents in follow-up for ulcer lateral aspect of the left foot previously with necrotic tissue and history of osteomyelitis.  Patient is currently on antibiotics with dialysis Tuesday Thursday Saturday. ? ?Assessment & Plan: ?Visit Diagnoses:  ?1. Chronic venous hypertension (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 ([place])   ?2. Ischemic ulcer of left foot, limited to breakdown of skin ([place])   ? ? ?Plan: Patient is scheduled for home health nursing to wrap his legs 2-3 times a week they will follow-up on Wednesday.  We will apply a Dynaflex compression wrap. ? ?Follow-Up Instructions: Return in about 4 weeks (around 02/19/2022).  ? ?Ortho Exam ? ?Patient is alert, oriented, no adenopathy, well-dressed, normal affect, normal respiratory effort. ?Examination patient has significant increased swelling to the left lower extremity.  He is ambulating in a motorized wheelchair from his brother he is undergoing the process to get a motorized wheelchair for himself.  The necrotic tissue has resolved the ulcer is 2 x 5 cm lateral aspect of the left foot.  There is fibrinous tissue over the wound. ? ?Imaging: ?No results found. ? ? ?Labs: ?Lab Results  ?Component Value Date  ? HGBA1C 7.6 (H) 01/11/2022  ? HGBA1C 7.9 (H) 06/14/2021  ? HGBA1C 5.7 (H) 09/16/2018  ? ESRSEDRATE 40 (H) 01/13/2022  ? ESRSEDRATE 55 (H) 01/11/2022  ? ESRSEDRATE 40 (H) 09/03/2021  ? CRP 3.6 (H) 01/13/2022  ? CRP 4.2 (H) 01/11/2022  ? CRP 2.7 (H) 09/07/2021  ? REPTSTATUS 01/16/2022 FINAL 01/11/2022  ? GRAMSTAIN  06/14/2021  ?  NO ORGANISMS SEEN SQUAMOUS  EPITHELIAL CELLS PRESENT ?ABUNDANT WBC PRESENT,BOTH PMN AND MONONUCLEAR ?MODERATE GRAM POSITIVE COCCI ?  ? CULT  01/11/2022  ?  NO GROWTH 5 DAYS ?Performed at [facility], [place] [address] [place], [place] [address] ?  ? LABORGA ENTEROCOCCUS FAECALIS 06/14/2021  ? ? ? ?Lab Results  ?Component Value Date  ? ALBUMIN 2.6 (L) 01/14/2022  ? ALBUMIN 3.0 (L) 01/13/2022  ? ALBUMIN 2.9 (L) 01/11/2022  ? PREALBUMIN 19.0 05/04/2016  ? PREALBUMIN 25.0 12/16/2015  ? ? ?Lab Results  ?Component Value Date  ? MG 1.8 01/14/2022  ? MG 1.9 05/24/2018  ? MG 2.1 05/23/2018  ? ?No results found for: VD25OH ? ?Lab Results  ?Component Value Date  ? PREALBUMIN 19.0 05/04/2016  ? PREALBUMIN 25.0 12/16/2015  ? ? ?  Latest Ref Rng & Units 01/14/2022  ?  1:11 AM 01/13/2022  ?  6:33 AM 01/12/2022  ?  7:23 AM  ?CBC EXTENDED  ?WBC 4.0 - 10.5 K/uL 4.4   4.4   4.9    ?RBC 4.22 - 5.81 MIL/uL 2.96   3.53   4.19    ?Hemoglobin 13.0 - 17.0 g/dL 9.2   10.6   12.5    ?HCT 39.0 - 52.0 % 26.2   31.7   37.1    ?Platelets 150 - 400 K/uL 165  165   157    ? ? ? ?There is no height or weight on file to calculate BMI. ? ?Orders:  ?No orders of the defined types were placed in this encounter. ? ?No orders of the defined types were placed in this encounter. ? ? ? Procedures: ?No procedures performed ? ?Clinical Data: ?No additional findings. ? ?ROS: ? ?All other systems negative, except as noted in the HPI. ?Review of Systems ? ?Objective: ?Vital Signs: There were no vitals taken for this visit. ? ?Specialty Comments:  ?No specialty comments available. ? ?PMFS History: ?Patient Active Problem List  ? Diagnosis Date Noted  ? Atrial fibrillation, chronic ([place]) 01/13/2022  ? Physical deconditioning 01/13/2022  ? Anemia of renal disease 01/13/2022  ? Obesity (BMI 30-39.9) 01/13/2022  ? Pressure injury of skin 01/13/2022  ? Intractable nausea and vomiting with epigastric pain 01/11/2022  ? Osteomyelitis ([place]) 01/11/2022  ? 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 Vitreous hemorrhage of right eye ([place]) 11/27/2021  ? Acute metabolic encephalopathy 92/42/6834  ? COVID 09/02/2021  ? Sepsis ([place]) 09/02/2021  ? Severe sepsis with lactic acidosis ([place]) 06/14/2021  ? Infection associated with implanted penile prosthesis ([place]) 06/14/2021  ? ESRD on hemodialysis ([place]) 06/14/2021  ? Paroxysmal atrial fibrillation with RVR ([place]) 06/14/2021  ? Carotid stenosis 06/15/2020  ? Dyslipidemia 06/15/2020  ? Diabetic macular edema of right eye with proliferative retinopathy associated with type 2 diabetes mellitus ([place]) 01/18/2020  ? Severe nonproliferative diabetic retinopathy of left eye, with macular edema, associated with type 2 diabetes mellitus ([place]) 01/04/2020  ? Retinal hemorrhage of right eye 01/04/2020  ? 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 Pseudoclaudication 11/15/2018  ? Essential hypertension 09/18/2018  ? S/P BKA (below knee amputation), right ([place]) 09/18/2018  ? Amputation of toe of left foot ([place]) 09/18/2018  ? Peripheral artery disease ([place]) 09/18/2018  ? S/P carotid endarterectomy 09/18/2018  ? OSA not tolerating CPAP 09/18/2018  ? Chronic back pain 09/18/2018  ? Status post reversal of ileostomy 05/21/2018  ? Normocytic anemia 02/14/2018  ? Intra-abdominal abscess ([place]) 11/04/2017  ? Chronic venous hypertension (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 ([place]) 12/11/2016  ? PAD (peripheral artery disease) ([place]) 11/12/2016  ? Unilateral primary osteoarthritis, left knee 10/11/2016  ? History of right below knee amputation ([place]) 07/12/2016  ? Infected diabetic left foot ulcer with chronic osteomyelitis   ? Diabetic foot infection ([place]) 05/04/2016  ? Insulin dependent type 2 diabetes mellitus ([place]) 12/16/2015  ? Amputated toe ([place]) 10/21/2015  ? Leg edema, left 09/03/2015  ? CAD (dz of distal, mid and proximal RCA with implantation of 3 overlapping drug-eluting stent,)  09/03/2015  ? Chronic diastolic heart failure, NYHA class 2 ([place]) 09/03/2015  ? Angina pectoris associated with type 2 diabetes mellitus ([place]) 10/28/2014  ? Hyperlipidemia 08/25/2014  ? Limb pain 03/20/2013  ? DJD (degenerative joint disease) 09/25/2012  ? Migraine 09/25/2012  ? Neuropathy 09/25/2012  ? Restless legs syndrome (RLS) 09/25/2012  ? Chronic obstructive pulmonary disease, unspecified ([place]) 04/25/2012  ? Unknown cause of morbidity or mortality 04/25/2012  ? Chronic total occlusion of artery of the extremities ([place]) 04/08/2012  ? Onychomycosis 02/01/2012  ? Occlusion and stenosis of carotid artery without mention of cerebral infarction 06/12/2011  ? GERD (gastroesophageal reflux disease) 05/08/2011  ? Barrett's esophagus without dysplasia 05/08/2011  ? Former tobacco use 02/01/2011  ? ?Past Medical History:  ?Diagnosis Date  ?  Carotid artery occlusion 11/10/10  ? LEFT CAROTID ENDARTERECTOMY  ? Chronic kidney disease   ? Complication of anesthesia   ? BP WENT UP AT [facility] "  ? COPD (chronic obstructive pulmonary disease) ([place])   ? pt denies this dx as of 06/01/20 - no inhaler   ? Diabetes mellitus without complication ([place])   ? Diverticulitis   ? Diverticulosis of colon (without mention of hemorrhage)   ? DJD (degenerative joint disease)   ? knees/hands/feet/back/neck  ? Fatty liver   ? Full dentures   ? GERD (gastroesophageal reflux disease)   ? H/O hiatal hernia   ? History of blood transfusion   ? with a past surical procedure per patient 06/01/20  ? Hyperlipidemia   ? Hypertension   ? Neuromuscular disorder ([place])   ? peripheral neuropathy  ? Non-pressure chronic ulcer of other part of left foot limited to breakdown of skin ([place]) 11/12/2016  ? Osteomyelitis ([place])   ? left 5th metatarsal  ? PAD (peripheral artery disease) ([place])   ? Distal aortogram June 2012. Atherectomy left popliteal artery July 2012.   ? Pseudoclaudication 11/15/2018  ? Sleep apnea   ? pt denies this dx as of 06/01/20  ? Slurred speech   ? AS PER  WIFE IN D/C NOTE 11/10/10  ? 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 Unstable angina ([place]) 09/16/2018  ? Wears glasses   ?  ?Family History  ?Problem Relation Age of Onset  ? Heart disease Father   ?     Before age 68-  CAD, BPG

## 2022-01-29 ENCOUNTER — Other Ambulatory Visit: Payer: Self-pay | Admitting: Orthopedic Surgery

## 2022-01-29 ENCOUNTER — Other Ambulatory Visit: Payer: Self-pay | Admitting: Cardiology

## 2022-01-29 ENCOUNTER — Encounter: Payer: Self-pay | Admitting: Orthopedic Surgery

## 2022-01-29 NOTE — Telephone Encounter (Signed)
Refill request

## 2022-01-29 NOTE — Progress Notes (Signed)
Office Visit Note   Patient: [name]           Date of Birth: 07-26-1956           MRN: [id number] Visit Date: 01/08/2022              Requested by: [name], PA-C [address] [place],  [place] [address] PCP: [name], PA-C  Chief Complaint  Patient presents with   Left Foot - Wound Check      HPI: Patient is a 66 year old gentleman who presents in follow-up for ischemic ulcer lateral left foot.  Patient states he has had decreased swelling.  Assessment & Plan: Visit Diagnoses:  1. Chronic venous hypertension (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 (HCC)   2. Ischemic ulcer of left foot, limited to breakdown of skin ([place])     Plan: Recommend resuming the nitroglycerin patch use Silvadene dressing changes.  Follow-Up Instructions: Return in about 2 weeks (around 01/22/2022).   Ortho Exam  Patient is alert, oriented, no adenopathy, well-dressed, normal affect, normal respiratory effort. Examination there is no cellulitis there is necrotic tissue over the lateral wound that is 2 x 4 cm there is no exposed bone or tendon.  Imaging: No results found. No images are attached to the encounter.  Labs: Lab Results  Component Value Date   HGBA1C 7.6 (H) 01/11/2022   HGBA1C 7.9 (H) 06/14/2021   HGBA1C 5.7 (H) 09/16/2018   ESRSEDRATE 40 (H) 01/13/2022   ESRSEDRATE 55 (H) 01/11/2022   ESRSEDRATE 40 (H) 09/03/2021   CRP 3.6 (H) 01/13/2022   CRP 4.2 (H) 01/11/2022   CRP 2.7 (H) 09/07/2021   REPTSTATUS 01/16/2022 FINAL 01/11/2022   GRAMSTAIN  06/14/2021    NO ORGANISMS SEEN SQUAMOUS EPITHELIAL CELLS PRESENT ABUNDANT WBC PRESENT,BOTH PMN AND MONONUCLEAR MODERATE GRAM POSITIVE COCCI    CULT  01/11/2022    NO GROWTH 5 DAYS Performed at [facility], [place] [facility] [address] [place], [place] [address]    LABORGA ENTEROCOCCUS FAECALIS 06/14/2021     Lab Results  Component Value Date   ALBUMIN 2.6 (L) 01/14/2022   ALBUMIN 3.0 (L)  01/13/2022   ALBUMIN 2.9 (L) 01/11/2022   PREALBUMIN 19.0 05/04/2016   PREALBUMIN 25.0 12/16/2015    Lab Results  Component Value Date   MG 1.8 01/14/2022   MG 1.9 05/24/2018   MG 2.1 05/23/2018   No results found for: VD25OH  Lab Results  Component Value Date   PREALBUMIN 19.0 05/04/2016   PREALBUMIN 25.0 12/16/2015      Latest Ref Rng & Units 01/14/2022    1:11 AM 01/13/2022    6:33 AM 01/12/2022    7:23 AM  CBC EXTENDED  WBC 4.0 - 10.5 K/uL 4.4   4.4   4.9    RBC 4.22 - 5.81 MIL/uL 2.96   3.53   4.19    Hemoglobin 13.0 - 17.0 g/dL 9.2   10.6   12.5    HCT 39.0 - 52.0 % 26.2   31.7   37.1    Platelets 150 - 400 K/uL 165   165   157       There is no height or weight on file to calculate BMI.  Orders:  No orders of the defined types were placed in this encounter.  No orders of the defined types were placed in this encounter.    Procedures: No procedures performed  Clinical Data: No additional findings.  ROS:  All other systems negative, except as noted in the HPI. Review of Systems  Objective: Vital Signs: There were no vitals taken for this visit.  Specialty Comments:  No specialty comments available.  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 ([place]) 11/27/2021   Vitreous hemorrhage of right eye ([place]) 21/30/8657   Acute metabolic encephalopathy 84/69/6295   COVID 09/02/2021   Sepsis ([place]) 09/02/2021   Severe sepsis with lactic acidosis ([place]) 06/14/2021   Infection associated with implanted penile prosthesis ([place]) 06/14/2021   ESRD  on hemodialysis ([place]) 06/14/2021   Paroxysmal atrial fibrillation with RVR ([place]) 06/14/2021   Carotid stenosis 06/15/2020   Dyslipidemia 06/15/2020   Diabetic macular edema of right eye with proliferative retinopathy associated with type 2 diabetes mellitus ([place]) 01/18/2020   Severe nonproliferative diabetic retinopathy of left eye, with macular edema, associated with type 2 diabetes mellitus ([place]) 01/04/2020   Retinal hemorrhage of right eye 01/04/2020   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   Pseudoclaudication 11/15/2018   Essential hypertension 09/18/2018   S/P BKA (below knee amputation), right ([place]) 09/18/2018   Amputation of toe of left foot ([place]) 09/18/2018   Peripheral artery disease ([place]) 09/18/2018   S/P carotid endarterectomy 09/18/2018   OSA not tolerating CPAP 09/18/2018   Chronic back pain 09/18/2018   Status post reversal of ileostomy 05/21/2018   Normocytic anemia 02/14/2018   Intra-abdominal abscess ([place]) 11/04/2017   Chronic venous hypertension (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 ([place]) 12/11/2016   PAD (peripheral artery disease) ([place]) 11/12/2016   Unilateral primary osteoarthritis, left knee 10/11/2016   History of right below knee amputation ([place]) 07/12/2016   Infected diabetic left foot ulcer with chronic osteomyelitis    Diabetic foot infection ([place]) 05/04/2016   Insulin dependent type 2 diabetes mellitus ([place]) 12/16/2015   Amputated toe ([place]) 10/21/2015   Leg edema, left 09/03/2015   CAD (dz of distal, mid and proximal RCA with implantation of 3 overlapping drug-eluting stent,) 09/03/2015   Chronic diastolic heart failure, NYHA class 2 ([place]) 09/03/2015   Angina pectoris associated with type 2 diabetes mellitus ([place]) 10/28/2014   Hyperlipidemia 08/25/2014   Limb pain 03/20/2013   DJD (degenerative joint disease) 09/25/2012   Migraine 09/25/2012   Neuropathy 09/25/2012   Restless legs syndrome (RLS) 09/25/2012   Chronic obstructive  pulmonary disease, unspecified ([place]) 04/25/2012   Unknown cause of morbidity or mortality 04/25/2012   Chronic total occlusion of artery of the extremities ([place]) 04/08/2012   Onychomycosis 02/01/2012   Occlusion and stenosis of carotid artery without mention of cerebral infarction 06/12/2011   GERD (gastroesophageal reflux disease) 05/08/2011   Barrett's esophagus without dysplasia 05/08/2011   Former tobacco use 02/01/2011   Past Medical History:  Diagnosis Date   Carotid artery occlusion 11/10/10   LEFT CAROTID ENDARTERECTOMY   Chronic kidney disease    Complication of anesthesia    BP WENT UP AT [facility] "   COPD (chronic obstructive pulmonary disease) ([place])    pt denies this dx as of 06/01/20 - no inhaler    Diabetes mellitus without complication ([place])  Diverticulitis    Diverticulosis of colon (without mention of hemorrhage)    DJD (degenerative joint disease)    knees/hands/feet/back/neck   Fatty liver    Full dentures    GERD (gastroesophageal reflux disease)    H/O hiatal hernia    History of blood transfusion    with a past surical procedure per patient 06/01/20   Hyperlipidemia    Hypertension    Neuromuscular disorder ([place])    peripheral neuropathy   Non-pressure chronic ulcer of other part of left foot limited to breakdown of skin ([place]) 11/12/2016   Osteomyelitis (HCC)    left 5th metatarsal   PAD (peripheral artery disease) ([place])    Distal aortogram June 2012. Atherectomy left popliteal artery July 2012.    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 Before age 52   Hyperlipidemia Brother    Hypertension Brother    Hypertension Son    Arthritis Other        GRANDMOTHER   Hypertension Other        OTHER FAMILY MEMBERS    Past Surgical History:  Procedure Laterality Date   ABDOMINAL AORTOGRAM W/LOWER EXTREMITY N/A 06/23/2021   Procedure: ABDOMINAL AORTOGRAM W/LOWER EXTREMITY;  Surgeon: Nigel Mormon, MD;  Location: [facility];  Service: Cardiovascular;  Laterality: N/A;   AMPUTATION  11/05/2011   Procedure: AMPUTATION RAY;  Surgeon: Wylene Simmer, MD;  Location: [facility];  Service: Orthopedics;  Laterality: Right;  Amputation of Right 4&5th Toes   AMPUTATION Left 11/26/2012   Procedure: AMPUTATION RAY;  Surgeon: Wylene Simmer, MD;  Location: [facility];  Service: Orthopedics;  Laterality: Left;  fourth ray amputation   AMPUTATION Right 08/27/2014   Procedure: Transmetatarsal Amputation;  Surgeon: Newt Minion, MD;  Location: [facility];  Service: Orthopedics;  Laterality: Right;   AMPUTATION Right 01/14/2015   Procedure: AMPUTATION BELOW KNEE;  Surgeon: Newt Minion, MD;  Location: [facility];  Service: Orthopedics;  Laterality: Right;   AMPUTATION Left 10/21/2015   Procedure: Left Foot 5th Ray Amputation;  Surgeon: Newt Minion, MD;  Location: [facility];  Service: Orthopedics;  Laterality: Left;   ANTERIOR FUSION CERVICAL SPINE  02/06/06   C4-5, C5-6, C6-7; SURGEON DR. MAX COHEN   AV FISTULA PLACEMENT Left 06/02/2020   Procedure: ARTERIOVENOUS (AV) FISTULA CREATION LEFT;  Surgeon: Waynetta Sandy, MD;  Location: [facility];  Service: Vascular;  Laterality: Left;   BACK SURGERY     x 3  BASCILIC VEIN TRANSPOSITION Left 07/21/2020   Procedure: LEFT UPPER ARM ATERIOVENOUS SUPERFISTULALIZATION;  Surgeon: Waynetta Sandy, MD;  Location: [facility];  Service: Vascular;  Laterality: Left;   BELOW KNEE LEG AMPUTATION Right    CARDIAC CATHETERIZATION  10/31/04   2009   CAROTID ENDARTERECTOMY  11/10/10   CAROTID ENDARTERECTOMY Left 11/10/2010   Subtotal occlusion of left internal carotid artery with left hemispheric transient ischemic attacks.   CAROTID STENT     CARPAL TUNNEL RELEASE Right 10/21/2013   Procedure: RIGHT CARPAL TUNNEL RELEASE;  Surgeon: Wynonia Sours, MD;  Location: [facility];  Service: Orthopedics;  Laterality: Right;   CHOLECYSTECTOMY     COLON SURGERY     COLONOSCOPY     COLOSTOMY REVERSAL  05/21/2018   ileostomy reversal   CYSTOSCOPY WITH STENT PLACEMENT Bilateral 01/13/2018   Procedure: CYSTOSCOPY WITH BILATERAL URETERAL CATHETER PLACEMENT;  Surgeon: Ardis Hughs, MD;  Location: [facility];  Service: Urology;  Laterality: Bilateral;   ESOPHAGEAL MANOMETRY Bilateral 07/19/2014   Procedure: ESOPHAGEAL MANOMETRY (EM);  Surgeon: Jerene Bears, MD;  [facility];  Service: Gastroenterology;  Laterality: Bilateral;   EYE SURGERY Bilateral 2020   cataract   FEMORAL ARTERY STENT     x6   FINGER SURGERY     FOOT SURGERY  04/25/2016    EXCISION BASE 5TH METATARSAL AND PARTIAL CUBOID LEFT FOOT   HERNIA REPAIR     LEFT INGUINAL AND UMBILICAL REPAIRS   HERNIA REPAIR     I & D EXTREMITY Left 04/25/2016   Procedure: EXCISION BASE 5TH METATARSAL AND PARTIAL CUBOID LEFT FOOT;  Surgeon: Newt Minion, MD;  Location: [facility];  Service: Orthopedics;  Laterality: Left;   ILEOSTOMY  01/13/2018   Procedure: ILEOSTOMY;  Surgeon: Clovis Riley, MD;  Location[facility];  Service: General;;   ILEOSTOMY CLOSURE N/A 05/21/2018   Procedure: ILEOSTOMY REVERSAL ERAS PATHWAY;  Surgeon: Clovis Riley, MD;  Location: [facility];  Service: General;  Laterality: N/A;   IR RADIOLOGIST EVAL & MGMT  11/19/2017   IR RADIOLOGIST EVAL & MGMT  12/03/2017   IR RADIOLOGIST EVAL & MGMT  12/18/2017    JOINT REPLACEMENT Right 2001   Total knee   LAMINECTOMY     X 3 LUMBAR AND X 2 CERVICAL SPINE OPERATIONS   LAPAROSCOPIC CHOLECYSTECTOMY W/ CHOLANGIOGRAPHY  11/09/04   SURGEON DR. [place] CATH AND CORONARY ANGIOGRAPHY N/A 09/16/2018   Procedure: LEFT HEART CATH AND CORONARY ANGIOGRAPHY;  Surgeon: Nigel Mormon, MD;  Location: [facility];  Service: Cardiovascular;  Laterality: N/A;   LEFT HEART CATHETERIZATION WITH CORONARY ANGIOGRAM N/A 10/29/2014   Procedure: LEFT HEART CATHETERIZATION WITH CORONARY ANGIOGRAM;  Surgeon: Laverda Page, MD;  Location: [facility];  Service: Cardiovascular;  Laterality: N/A;   LIGATION OF COMPETING BRANCHES OF ARTERIOVENOUS FISTULA Left 07/21/2020   Procedure: LIGATION OF COMPETING BRANCHES OF LEFT UPPER ARM ARTERIOVENOUS FISTULA;  Surgeon: Waynetta Sandy, MD;  Location: [facility];  Service: Vascular;  Laterality: Left;   LOWER EXTREMITY ANGIOGRAM N/A 03/19/2012   Procedure: LOWER EXTREMITY ANGIOGRAM;  Surgeon: Burnell Blanks, MD;  Location: [facility];  Service: Cardiovascular;  Laterality: N/A;   LOWER EXTREMITY ANGIOGRAPHY N/A 06/20/2021   Procedure: LOWER EXTREMITY ANGIOGRAPHY;  Surgeon: Nigel Mormon, MD;  Location: [facility];  Service: Cardiovascular;  Laterality: N/A;   NECK SURGERY     PARTIAL  COLECTOMY N/A 01/13/2018   Procedure: LAPAROSCOPIC ASSISTED   SIGMOID COLECTOMY ILEOSTOMY;  Surgeon: Clovis Riley, MD;  [facility];  Service: General;  Laterality: N/A;   PENILE PROSTHESIS IMPLANT  08/14/05   INFRAPUBIC INSERTION OF INFLATABLE PENILE PROSTHESIS; SURGEON DR. Amalia Hailey   PENILE PROSTHESIS IMPLANT     PERCUTANEOUS CORONARY STENT INTERVENTION (PCI-S) Right 10/29/2014   Procedure: PERCUTANEOUS CORONARY STENT INTERVENTION (PCI-S);  Surgeon: Laverda Page, MD;  Location[facility];  Service: Cardiovascular;  Laterality: Right;   PERIPHERAL VASCULAR INTERVENTION Left 06/23/2021    Procedure: PERIPHERAL VASCULAR INTERVENTION;  Surgeon: Nigel Mormon, MD;  Location: [facility];  Service: Cardiovascular;  Laterality: Left;   REMOVAL OF PENILE PROSTHESIS N/A 06/14/2021   Procedure: Removal of THREE piece inflatable penile prosthesis;  Surgeon: Lucas Mallow, MD;  Location: [facility];  Service: Urology;  Laterality: N/A;   SHOULDER ARTHROSCOPY     SPINE SURGERY     TOE AMPUTATION Left    TONSILLECTOMY     TOTAL KNEE ARTHROPLASTY  07/2002   RIGHT KNEE ; SURGEON  DR. [name] ALSO HAD ARTHROSCOPIC RIGHT KNEE IN  10/2001   TOTAL KNEE ARTHROPLASTY     ULNAR NERVE TRANSPOSITION Right 10/21/2013   Procedure: RIGHT ELBOW  ULNAR NERVE DECOMPRESSION;  Surgeon: Wynonia Sours, MD;  Location: [facility];  Service: Orthopedics;  Laterality: Right;   Social History   Occupational History   Occupation: Magazine features editor: UNEMPLOYED  Tobacco Use   Smoking status: Former    Packs/day: 2.00    Years: 35.00    Pack years: 70.00    Types: Cigarettes    Quit date: 10/28/2011    Years since quitting: 10.2   Smokeless tobacco: Never  Vaping Use   Vaping Use: Some days   Substances: Nicotine  Substance and Sexual Activity   Alcohol use: Not Currently    Comment: "not in a long time"   Drug use: Never   Sexual activity: Yes    Birth control/protection: Implant    Comment: penile implant

## 2022-01-31 ENCOUNTER — Encounter (HOSPITAL_COMMUNITY): Payer: Self-pay

## 2022-02-06 ENCOUNTER — Telehealth: Payer: Self-pay | Admitting: Orthopedic Surgery

## 2022-02-06 NOTE — Telephone Encounter (Signed)
Received call from [place] (OT) with [facility] needing verbal orders for HHOT 1 Wk 4. The number to contact Ronalee Belts is [phone]

## 2022-02-07 ENCOUNTER — Encounter (HOSPITAL_COMMUNITY): Payer: Self-pay

## 2022-02-07 NOTE — Telephone Encounter (Signed)
LM on Mikes' Id'd VM of verbal approval

## 2022-02-09 ENCOUNTER — Telehealth: Payer: Self-pay | Admitting: Family

## 2022-02-09 NOTE — Telephone Encounter (Signed)
Earney Navy (RN) called from [facility] called requesting a call back. She states pt's wound is getting bigger in size and unsure if pt can be seen today. Please call on secure line Allisa at [phone].

## 2022-02-09 NOTE — Telephone Encounter (Signed)
Can you please call pt and make an appt for Monday with [name]. Let me know what time to open. Junie Panning is only here in the morning on Friday and [name] is in surgery.

## 2022-02-10 DIAGNOSIS — N2581 Secondary hyperparathyroidism of renal origin: Secondary | ICD-10-CM | POA: Diagnosis not present

## 2022-02-10 DIAGNOSIS — Z992 Dependence on renal dialysis: Secondary | ICD-10-CM | POA: Diagnosis not present

## 2022-02-10 DIAGNOSIS — N186 End stage renal disease: Secondary | ICD-10-CM | POA: Diagnosis not present

## 2022-02-12 ENCOUNTER — Encounter (HOSPITAL_COMMUNITY): Payer: Self-pay

## 2022-02-12 ENCOUNTER — Encounter (INDEPENDENT_AMBULATORY_CARE_PROVIDER_SITE_OTHER): Payer: 59 | Admitting: Ophthalmology

## 2022-02-12 ENCOUNTER — Telehealth: Payer: Self-pay

## 2022-02-12 ENCOUNTER — Encounter (INDEPENDENT_AMBULATORY_CARE_PROVIDER_SITE_OTHER): Payer: Self-pay

## 2022-02-12 NOTE — Telephone Encounter (Signed)
Tillie Rung with [facility] would like verbal orders to resume nursing, PT, and OT due to patient's insurance changing.  Cb# [phone], fax# [phone].  Please advise.  Thank you.

## 2022-02-12 NOTE — Telephone Encounter (Signed)
Tillie Rung informed okay to resume/restart home health due to insurance change.

## 2022-02-13 ENCOUNTER — Telehealth: Payer: Self-pay | Admitting: Orthopedic Surgery

## 2022-02-13 ENCOUNTER — Encounter (HOSPITAL_COMMUNITY): Payer: Self-pay

## 2022-02-13 DIAGNOSIS — E1169 Type 2 diabetes mellitus with other specified complication: Secondary | ICD-10-CM | POA: Diagnosis not present

## 2022-02-13 DIAGNOSIS — K760 Fatty (change of) liver, not elsewhere classified: Secondary | ICD-10-CM | POA: Diagnosis not present

## 2022-02-13 DIAGNOSIS — L03116 Cellulitis of left lower limb: Secondary | ICD-10-CM | POA: Diagnosis not present

## 2022-02-13 DIAGNOSIS — K219 Gastro-esophageal reflux disease without esophagitis: Secondary | ICD-10-CM | POA: Diagnosis not present

## 2022-02-13 DIAGNOSIS — E1151 Type 2 diabetes mellitus with diabetic peripheral angiopathy without gangrene: Secondary | ICD-10-CM | POA: Diagnosis not present

## 2022-02-13 DIAGNOSIS — I132 Hypertensive heart and chronic kidney disease with heart failure and with stage 5 chronic kidney disease, or end stage renal disease: Secondary | ICD-10-CM | POA: Diagnosis not present

## 2022-02-13 DIAGNOSIS — E11621 Type 2 diabetes mellitus with foot ulcer: Secondary | ICD-10-CM | POA: Diagnosis not present

## 2022-02-13 DIAGNOSIS — G43909 Migraine, unspecified, not intractable, without status migrainosus: Secondary | ICD-10-CM | POA: Diagnosis not present

## 2022-02-13 DIAGNOSIS — K2270 Barrett's esophagus without dysplasia: Secondary | ICD-10-CM | POA: Diagnosis not present

## 2022-02-13 DIAGNOSIS — E785 Hyperlipidemia, unspecified: Secondary | ICD-10-CM | POA: Diagnosis not present

## 2022-02-13 DIAGNOSIS — G4733 Obstructive sleep apnea (adult) (pediatric): Secondary | ICD-10-CM | POA: Diagnosis not present

## 2022-02-13 DIAGNOSIS — G2581 Restless legs syndrome: Secondary | ICD-10-CM | POA: Diagnosis not present

## 2022-02-13 DIAGNOSIS — J449 Chronic obstructive pulmonary disease, unspecified: Secondary | ICD-10-CM | POA: Diagnosis not present

## 2022-02-13 DIAGNOSIS — N400 Enlarged prostate without lower urinary tract symptoms: Secondary | ICD-10-CM | POA: Diagnosis not present

## 2022-02-13 DIAGNOSIS — L97521 Non-pressure chronic ulcer of other part of left foot limited to breakdown of skin: Secondary | ICD-10-CM | POA: Diagnosis not present

## 2022-02-13 DIAGNOSIS — M159 Polyosteoarthritis, unspecified: Secondary | ICD-10-CM | POA: Diagnosis not present

## 2022-02-13 DIAGNOSIS — J439 Emphysema, unspecified: Secondary | ICD-10-CM | POA: Diagnosis not present

## 2022-02-13 DIAGNOSIS — I480 Paroxysmal atrial fibrillation: Secondary | ICD-10-CM | POA: Diagnosis not present

## 2022-02-13 DIAGNOSIS — D631 Anemia in chronic kidney disease: Secondary | ICD-10-CM | POA: Diagnosis not present

## 2022-02-13 DIAGNOSIS — Z794 Long term (current) use of insulin: Secondary | ICD-10-CM | POA: Diagnosis not present

## 2022-02-13 DIAGNOSIS — Z87891 Personal history of nicotine dependence: Secondary | ICD-10-CM | POA: Diagnosis not present

## 2022-02-13 DIAGNOSIS — I5030 Unspecified diastolic (congestive) heart failure: Secondary | ICD-10-CM | POA: Diagnosis not present

## 2022-02-13 DIAGNOSIS — Z792 Long term (current) use of antibiotics: Secondary | ICD-10-CM | POA: Diagnosis not present

## 2022-02-13 DIAGNOSIS — R6520 Severe sepsis without septic shock: Secondary | ICD-10-CM | POA: Diagnosis not present

## 2022-02-13 DIAGNOSIS — G709 Myoneural disorder, unspecified: Secondary | ICD-10-CM | POA: Diagnosis not present

## 2022-02-13 DIAGNOSIS — Z9989 Dependence on other enabling machines and devices: Secondary | ICD-10-CM | POA: Diagnosis not present

## 2022-02-13 DIAGNOSIS — I2510 Atherosclerotic heart disease of native coronary artery without angina pectoris: Secondary | ICD-10-CM | POA: Diagnosis not present

## 2022-02-13 DIAGNOSIS — L219 Seborrheic dermatitis, unspecified: Secondary | ICD-10-CM | POA: Diagnosis not present

## 2022-02-13 DIAGNOSIS — Z992 Dependence on renal dialysis: Secondary | ICD-10-CM | POA: Diagnosis not present

## 2022-02-13 DIAGNOSIS — G894 Chronic pain syndrome: Secondary | ICD-10-CM | POA: Diagnosis not present

## 2022-02-13 DIAGNOSIS — M86672 Other chronic osteomyelitis, left ankle and foot: Secondary | ICD-10-CM | POA: Diagnosis not present

## 2022-02-13 DIAGNOSIS — Z89511 Acquired absence of right leg below knee: Secondary | ICD-10-CM | POA: Diagnosis not present

## 2022-02-13 DIAGNOSIS — N2581 Secondary hyperparathyroidism of renal origin: Secondary | ICD-10-CM | POA: Diagnosis not present

## 2022-02-13 DIAGNOSIS — S81802D Unspecified open wound, left lower leg, subsequent encounter: Secondary | ICD-10-CM | POA: Diagnosis not present

## 2022-02-13 DIAGNOSIS — I87302 Chronic venous hypertension (idiopathic) without complications of left lower extremity: Secondary | ICD-10-CM | POA: Diagnosis not present

## 2022-02-13 DIAGNOSIS — D509 Iron deficiency anemia, unspecified: Secondary | ICD-10-CM | POA: Diagnosis not present

## 2022-02-13 DIAGNOSIS — A401 Sepsis due to streptococcus, group B: Secondary | ICD-10-CM | POA: Diagnosis not present

## 2022-02-13 DIAGNOSIS — L97323 Non-pressure chronic ulcer of left ankle with necrosis of muscle: Secondary | ICD-10-CM | POA: Diagnosis not present

## 2022-02-13 DIAGNOSIS — E11622 Type 2 diabetes mellitus with other skin ulcer: Secondary | ICD-10-CM | POA: Diagnosis not present

## 2022-02-13 DIAGNOSIS — E113513 Type 2 diabetes mellitus with proliferative diabetic retinopathy with macular edema, bilateral: Secondary | ICD-10-CM | POA: Diagnosis not present

## 2022-02-13 DIAGNOSIS — E1142 Type 2 diabetes mellitus with diabetic polyneuropathy: Secondary | ICD-10-CM | POA: Diagnosis not present

## 2022-02-13 DIAGNOSIS — E669 Obesity, unspecified: Secondary | ICD-10-CM | POA: Diagnosis not present

## 2022-02-13 DIAGNOSIS — I453 Trifascicular block: Secondary | ICD-10-CM | POA: Diagnosis not present

## 2022-02-13 DIAGNOSIS — K5730 Diverticulosis of large intestine without perforation or abscess without bleeding: Secondary | ICD-10-CM | POA: Diagnosis not present

## 2022-02-13 DIAGNOSIS — I5032 Chronic diastolic (congestive) heart failure: Secondary | ICD-10-CM | POA: Diagnosis not present

## 2022-02-13 DIAGNOSIS — M869 Osteomyelitis, unspecified: Secondary | ICD-10-CM | POA: Diagnosis not present

## 2022-02-13 DIAGNOSIS — E1122 Type 2 diabetes mellitus with diabetic chronic kidney disease: Secondary | ICD-10-CM | POA: Diagnosis not present

## 2022-02-13 DIAGNOSIS — N186 End stage renal disease: Secondary | ICD-10-CM | POA: Diagnosis not present

## 2022-02-13 NOTE — Telephone Encounter (Signed)
Cari informed to keep same wound care orders with dynaflex twice a week to left lower leg. They had to get "renewel" order due to his insurance changing.

## 2022-02-13 NOTE — Telephone Encounter (Signed)
Please call Cari back regarding Home health Orders for Nursing

## 2022-02-14 ENCOUNTER — Encounter (HOSPITAL_COMMUNITY): Payer: Self-pay

## 2022-02-14 ENCOUNTER — Emergency Department (HOSPITAL_COMMUNITY)
Admission: EM | Admit: 2022-02-14 | Discharge: 2022-02-15 | Disposition: A | Discharge status: Home / Self Care | Payer: Medicare HMO | Attending: Student | Admitting: Student

## 2022-02-14 ENCOUNTER — Other Ambulatory Visit: Payer: Self-pay

## 2022-02-14 ENCOUNTER — Other Ambulatory Visit: Payer: Medicare Other | Admitting: Student

## 2022-02-14 DIAGNOSIS — Z7901 Long term (current) use of anticoagulants: Secondary | ICD-10-CM | POA: Insufficient documentation

## 2022-02-14 DIAGNOSIS — E119 Type 2 diabetes mellitus without complications: Secondary | ICD-10-CM | POA: Insufficient documentation

## 2022-02-14 DIAGNOSIS — Z91040 Latex allergy status: Secondary | ICD-10-CM | POA: Diagnosis not present

## 2022-02-14 DIAGNOSIS — Z992 Dependence on renal dialysis: Secondary | ICD-10-CM

## 2022-02-14 DIAGNOSIS — R944 Abnormal results of kidney function studies: Secondary | ICD-10-CM | POA: Insufficient documentation

## 2022-02-14 DIAGNOSIS — Z79899 Other long term (current) drug therapy: Secondary | ICD-10-CM | POA: Diagnosis not present

## 2022-02-14 DIAGNOSIS — Z794 Long term (current) use of insulin: Secondary | ICD-10-CM | POA: Diagnosis not present

## 2022-02-14 DIAGNOSIS — R079 Chest pain, unspecified: Secondary | ICD-10-CM | POA: Diagnosis not present

## 2022-02-14 DIAGNOSIS — I10 Essential (primary) hypertension: Secondary | ICD-10-CM | POA: Insufficient documentation

## 2022-02-14 DIAGNOSIS — M86672 Other chronic osteomyelitis, left ankle and foot: Secondary | ICD-10-CM

## 2022-02-14 DIAGNOSIS — R0689 Other abnormalities of breathing: Secondary | ICD-10-CM | POA: Diagnosis not present

## 2022-02-14 DIAGNOSIS — R110 Nausea: Secondary | ICD-10-CM

## 2022-02-14 DIAGNOSIS — L89151 Pressure ulcer of sacral region, stage 1: Secondary | ICD-10-CM | POA: Diagnosis not present

## 2022-02-14 DIAGNOSIS — R52 Pain, unspecified: Secondary | ICD-10-CM

## 2022-02-14 DIAGNOSIS — Z743 Need for continuous supervision: Secondary | ICD-10-CM | POA: Diagnosis not present

## 2022-02-14 DIAGNOSIS — R0789 Other chest pain: Secondary | ICD-10-CM | POA: Diagnosis not present

## 2022-02-14 LAB — COMPREHENSIVE METABOLIC PANEL
ALT: 15 U/L (ref 0–44)
AST: 16 U/L (ref 15–41)
Albumin: 3.1 g/dL — ABNORMAL LOW (ref 3.5–5.0)
Alkaline Phosphatase: 114 U/L (ref 38–126)
Anion gap: 12 (ref 5–15)
BUN: 26 mg/dL — ABNORMAL HIGH (ref 8–23)
CO2: 29 mmol/L (ref 22–32)
Calcium: 7.8 mg/dL — ABNORMAL LOW (ref 8.9–10.3)
Chloride: 95 mmol/L — ABNORMAL LOW (ref 98–111)
Creatinine, Ser: 6.26 mg/dL — ABNORMAL HIGH (ref 0.61–1.24)
GFR, Estimated: 9 mL/min — ABNORMAL LOW (ref >60)
Glucose, Bld: 244 mg/dL — ABNORMAL HIGH (ref 70–99)
Potassium: 4.2 mmol/L (ref 3.5–5.1)
Sodium: 136 mmol/L (ref 135–145)
Total Bilirubin: 0.6 mg/dL (ref 0.3–1.2)
Total Protein: 6 g/dL — ABNORMAL LOW (ref 6.5–8.1)

## 2022-02-14 LAB — CBC WITH DIFFERENTIAL/PLATELET
Abs Immature Granulocytes: 0.01 10*3/uL (ref 0.00–0.07)
Basophils Absolute: 0 10*3/uL (ref 0.0–0.1)
Basophils Relative: 1 %
Eosinophils Absolute: 0.1 10*3/uL (ref 0.0–0.5)
Eosinophils Relative: 3 %
HCT: 32.1 % — ABNORMAL LOW (ref 39.0–52.0)
Hemoglobin: 10.8 g/dL — ABNORMAL LOW (ref 13.0–17.0)
Immature Granulocytes: 0 %
Lymphocytes Relative: 21 %
Lymphs Abs: 1 10*3/uL (ref 0.7–4.0)
MCH: 31.1 pg (ref 26.0–34.0)
MCHC: 33.6 g/dL (ref 30.0–36.0)
MCV: 92.5 fL (ref 80.0–100.0)
Monocytes Absolute: 0.5 10*3/uL (ref 0.1–1.0)
Monocytes Relative: 9 %
Neutro Abs: 3.3 10*3/uL (ref 1.7–7.7)
Neutrophils Relative %: 66 %
Platelets: 142 10*3/uL — ABNORMAL LOW (ref 150–400)
RBC: 3.47 MIL/uL — ABNORMAL LOW (ref 4.22–5.81)
RDW: 13.4 % (ref 11.5–15.5)
WBC: 5 10*3/uL (ref 4.0–10.5)
nRBC: 0 % (ref 0.0–0.2)

## 2022-02-14 LAB — LACTIC ACID, PLASMA
Lactic Acid, Venous: 1.5 mmol/L (ref 0.5–1.9)
Lactic Acid, Venous: 1.6 mmol/L (ref 0.5–1.9)

## 2022-02-14 MED ORDER — ONDANSETRON 4 MG PO TBDP
8.0000 mg | ORAL_TABLET | Freq: Once | ORAL | Status: AC
  Administered 2022-02-14: 8 mg via ORAL
  Filled 2022-02-14: qty 2

## 2022-02-14 NOTE — ED Notes (Signed)
PTAR Called 

## 2022-02-14 NOTE — ED Provider Triage Note (Signed)
Emergency Medicine Provider Triage Evaluation Note  [name] , a 66 y.o. male  was evaluated in triage.  Pt complains of generalized weakness/feeling worse.  Patient completed dialysis yesterday but felt terrible at the end of it, he has chronic wounds to his left lower extremity, buttocks, left hip.  Left hip started 2 months ago, wound care comes to his house twice a week to change dressing.  States he has been septic before and feels like this is the direction he is headed..  Review of Systems  Per HPI Physical Exam  BP (!) 143/70   Pulse 92   Temp 99.4 F (37.4 C) (Oral)   Resp 16   SpO2 95%  Gen:   Awake, no distress. Pale Resp:  Normal effort  MSK:   Moves extremities without difficulty  Other:  Chronic appearing wounds  Medical Decision Making  Medically screening exam initiated at 12:33 PM.  Appropriate orders placed.  [name] was informed that the remainder of the evaluation will be completed by another provider, this initial triage assessment does not replace that evaluation, and the importance of remaining in the ED until their evaluation is complete.  Temperature and pulse rate are elevated but technically does not meet septic criteria.  Not a code sepsis, will check labs and lactic.   Sherrill Raring, PA-C 02/14/22 1234

## 2022-02-14 NOTE — ED Provider Notes (Signed)
[facility] EMERGENCY DEPARTMENT Provider Note   CSN: [id number] Arrival date & time: 02/14/22  1215     History No chief complaint on file.   [name] is a 66 y.o. male with medical history of pressure ulcer left foot currently being cared for by wound care, right-sided BKA, DJD, GERD, hypertension, PAD.  The patient presents ED for evaluation of pressure ulcers.  Patient is incisional disease, receives dialysis on Tuesday Thursdays and Saturdays.  Patient states that he recently had full session day prior.  Patient states that after his session, he became very nauseated and weak.  Patient reports that this is typical for his dialysis sessions.  Patient states that main complaint for presentation today is due to possible infection of pressure ulcers on buttocks.  Patient states that these pressure ulcers been present for about 3 months, denies any drainage.  Patient denies any fevers, nausea, vomiting.  Patient denies any shortness of breath or chest pain.  Patient does endorse nausea however.  HPI     Home Medications Prior to Admission medications   Medication Sig Start Date End Date Taking? Authorizing Provider  amiodarone (PACERONE) 200 MG tablet TAKE 2 TABLETS DAILY FOR 10 DAYS THEN 1 TABLET DAILY Patient taking differently: Take 200 mg by mouth daily. 01/29/22  Yes Adrian Prows, MD  amLODipine (NORVASC) 5 MG tablet Take 5 mg by mouth daily. 01/30/21  Yes [provider]  apixaban (ELIQUIS) 5 MG TABS tablet Take 1 tablet (5 mg total) by mouth 2 (two) times daily. 01/14/22 03/15/22 Yes Mercy Riding, MD  atorvastatin (LIPITOR) 40 MG tablet Take 40 mg by mouth at bedtime.    Yes [provider]  B Complex-C-Zn-Folic Acid (DIALYVITE/ZINC) TABS Take 1 tablet by mouth daily. 10/04/21  Yes [provider]  doxycycline (VIBRA-TABS) 100 MG tablet Take 100 mg by mouth See admin instructions. Bid x 30 days 01/15/22  Yes [provider]  fentaNYL  (DURAGESIC) 50 MCG/HR Place 1 patch onto the skin every 3 (three) days. 09/05/21  Yes Sharen Hones, MD  folic acid (FOLVITE) 1 MG tablet Take 1 mg by mouth daily. 09/18/21  Yes [provider]  Glucosamine HCl (GLUCOSAMINE PO) Take 1 tablet by mouth 2 (two) times daily.   Yes [provider]  HUMALOG KWIKPEN 100 UNIT/ML KwikPen Inject 4 Units into the skin 3 (three) times daily. Patient taking differently: Inject 13 Units into the skin 3 (three) times daily. 09/05/21  Yes Sharen Hones, MD  insulin glargine (LANTUS) 100 UNIT/ML injection Inject 0.1 mLs (10 Units total) into the skin at bedtime. Patient taking differently: Inject 30 Units into the skin at bedtime. 06/30/21  Yes Antonieta Pert, MD  ketoconazole (NIZORAL) 2 % cream Apply 1 application topically 2 (two) times daily as needed for irritation.  03/07/18  Yes [provider]  lidocaine-prilocaine (EMLA) cream Apply 1 application topically See admin instructions. Prior to Dialysis days Tuesday,Thursday and saturday 06/06/21  Yes [provider]  linaclotide (LINZESS) 145 MCG CAPS capsule Take 145 mcg by mouth daily as needed (constipation).    Yes [provider]  loratadine (CLARITIN) 10 MG tablet Take 10 mg by mouth daily.   Yes [provider]  metoCLOPramide (REGLAN) 5 MG tablet Take 1 tablet (5 mg total) by mouth 4 (four) times daily -  before meals and at bedtime. Patient taking differently: Take 5 mg by mouth 3 (three) times daily before meals. 01/14/22 07/13/22 Yes Gonfa,  Charlesetta Ivory, MD  midodrine (PROAMATINE) 10 MG tablet Take 1 tablet (10 mg total) by mouth every other day. One hour before dialysis 09/07/21  Yes Ghimire, Dante Gang, MD  NEEDLE, REUSABLE, 22 G 22G X 1-1/2" MISC 1 Units by Does not apply route as directed. For B12 IM inj 02/15/18  Yes Elgergawy, Silver Huguenin, MD  nitroGLYCERIN (NITRODUR - DOSED IN MG/24 HR) 0.2 mg/hr patch Place 1 patch (0.2 mg total) onto the skin daily. 06/12/21  Yes Newt Minion, MD  nitroGLYCERIN (NITROSTAT) 0.4 MG SL tablet Place 0.4 mg under the tongue every 5 (five) minutes as needed for chest pain. 01/06/22  Yes [provider]  omega-3 acid ethyl esters (LOVAZA) 1 g capsule Take 2 g by mouth 2 (two) times daily.   Yes [provider]  ondansetron (ZOFRAN) 4 MG tablet Take 4 mg by mouth every 6 (six) hours as needed for nausea or vomiting.   Yes [provider]  oxyCODONE-acetaminophen (PERCOCET) 7.5-325 MG tablet Take 1 tablet by mouth 3 (three) times daily as needed for moderate pain. Patient taking differently: Take 1 tablet by mouth every 8 (eight) hours. 09/05/21  Yes Sharen Hones, MD  pantoprazole (PROTONIX) 40 MG tablet Take 1 tablet (40 mg total) by mouth 2 (two) times daily for 30 days, THEN 1 tablet (40 mg total) daily. 01/14/22 04/14/22 Yes Mercy Riding, MD  Polyvinyl Alcohol-Povidone PF 1.4-0.6 % SOLN Place 1 drop into both eyes 2 (two) times daily as needed (dry eyes).    Yes [provider]  pregabalin (LYRICA) 100 MG capsule Take 100 mg by mouth 2 (two) times daily. 09/18/21  Yes [provider]  rOPINIRole (REQUIP) 2 MG tablet Take 2 mg by mouth at bedtime.   Yes [provider]  sevelamer carbonate (RENVELA) 800 MG tablet Take 800 mg by mouth 3 (three) times daily. 12/17/21  Yes [provider]  Syringe/Needle, Disp, (SYRINGE 3CC/22GX1-1/2") 22G X 1-1/2" 3 ML MISC 1 Syringe by Does not apply route as directed. For b12 IM inj 02/15/18  Yes Elgergawy, Silver Huguenin, MD  tamsulosin (FLOMAX) 0.4 MG CAPS capsule Take 0.4 mg by mouth daily.   Yes [provider]  silver sulfADIAZINE (SILVADENE) 1 % cream Apply topically daily. 01/14/22   Mercy Riding, MD      Allergies    Contrast media [iodinated contrast media], Ivp dye [iodinated contrast media], Adhesive [tape], and Latex    Review of Systems   Review of Systems  Constitutional:  Negative for chills and fever.  Respiratory:  Negative  for shortness of breath.   Cardiovascular:  Negative for chest pain.  Gastrointestinal:  Positive for nausea. Negative for abdominal pain and vomiting.  Skin:  Positive for wound.  All other systems reviewed and are negative.  Physical Exam Updated Vital Signs BP (!) 189/75   Pulse 70   Temp 99 F (37.2 C) (Oral)   Resp 16   SpO2 99%  Physical Exam Vitals and nursing note reviewed.  Constitutional:      General: He is not in acute distress.    Appearance: Normal appearance. He is not ill-appearing, toxic-appearing or diaphoretic.  HENT:     Head: Normocephalic and atraumatic.     Nose: Nose normal. No congestion.     Mouth/Throat:     Mouth: Mucous membranes are moist.     Pharynx: Oropharynx is clear.  Eyes:     Extraocular Movements: Extraocular movements intact.  Conjunctiva/sclera: Conjunctivae normal.     Pupils: Pupils are equal, round, and reactive to light.  Cardiovascular:     Rate and Rhythm: Normal rate and regular rhythm.  Pulmonary:     Effort: Pulmonary effort is normal.     Breath sounds: Normal breath sounds. No wheezing.  Abdominal:     General: Abdomen is flat. Bowel sounds are normal.     Palpations: Abdomen is soft.     Tenderness: There is no abdominal tenderness.  Musculoskeletal:     Cervical back: Normal range of motion and neck supple. No tenderness.     Right Lower Extremity: Right leg is amputated below knee.  Skin:    General: Skin is warm and dry.     Capillary Refill: Capillary refill takes less than 2 seconds.     Findings: Wound present.       Neurological:     Mental Status: He is alert and oriented to person, place, and time.    ED Results / Procedures / Treatments   Labs (all labs ordered are listed, but only abnormal results are displayed) Labs Reviewed  COMPREHENSIVE METABOLIC PANEL - Abnormal; Notable for the following components:      Result Value   Chloride 95 (*)    Glucose, Bld 244 (*)    BUN 26 (*)     Creatinine, Ser 6.26 (*)    Calcium 7.8 (*)    Total Protein 6.0 (*)    Albumin 3.1 (*)    GFR, Estimated 9 (*)    All other components within normal limits  CBC WITH DIFFERENTIAL/PLATELET - Abnormal; Notable for the following components:   RBC 3.47 (*)    Hemoglobin 10.8 (*)    HCT 32.1 (*)    Platelets 142 (*)    All other components within normal limits  LACTIC ACID, PLASMA  LACTIC ACID, PLASMA    EKG None  Radiology No results found.  Procedures Procedures    Medications Ordered in ED Medications  ondansetron (ZOFRAN-ODT) disintegrating tablet 8 mg (8 mg Oral Given 02/14/22 2018)    ED Course/ Medical Decision Making/ A&P                           Medical Decision Making Risk Prescription drug management.   66 year old male presents to the ED for evaluation.  Please see HPI for further details.  On examination, the patient is afebrile, nontachycardic.  Patient abdomen is soft and compressible all 4 quadrants.  Patient lung sounds are clear bilaterally and he is not hypoxic on room air.  The patient is alert and oriented x4.  The patient follows commands appropriately.  The patient has a stage I pressure ulcer noted to his right butt cheek as well as a stage II pressure ulcer noted to his left butt cheek.  There is no adipose tissue noted.  There is no drainage noted.  Patient worked up utilizing the following labs and imaging studies interpreted personally: - CMP shows elevated glucose of 244, patient states he has history of diabetes and is not compliant on medications.  Creatinine elevated to 6.26 over the patient has been stage renal disease and this is the patient's baseline. - CBC shows no elevated white blood cell count to indicate infection.  There is decrease hemoglobin however this is consistent with patient's baseline.  The patient denies any lightheadedness, dizziness, weakness, shortness of breath - Lactic acid unremarkable at 1.6 - Patient  provided with 8  mg Zofran, he states that he is no longer nauseous  Patient pressure ulcers were covered with gauze, Tegaderm, ointment.  Patient was provided with sacral donut.  At this time, the patient is stable for discharge.  The patient will be encouraged to continue following up with wound care for his pressure ulcers.  The patient was counseled on ways to alleviate pressure on his buttocks and he has voiced understanding of these.  The patient was given return precautions and he voiced understanding.  The patient had all of his questions answered his satisfaction.  The patient is stable this time for discharge home.   Final Clinical Impression(s) / ED Diagnoses Final diagnoses:  Pressure injury of sacral region, stage 1    Rx / DC Orders ED Discharge Orders     None         Lawana Chambers 02/14/22 2325    Teressa Lower, MD 02/14/22 [phone]

## 2022-02-14 NOTE — Progress Notes (Signed)
[facility] Consult Note Telephone: [phone]  Fax: [phone]    Date of encounter: 02/14/22 9:16 AM PATIENT NAME: [name] [address] [place] [address]   [id number] (home)  DOB: August 26, 1956 MRN: [id number] PRIMARY CARE PROVIDER:    Fae Pippin,  [place] [address] [phone]  REFERRING PROVIDER:   [name], PA-C [address] [place],  [place] [address] [phone]  RESPONSIBLE PARTY:    Contact Information     Name Relation Home Work Mobile   Bossler,[name] Dover Spouse  [phone]   Cadel, Stairs   [phone]       Due to the COVID-19 crisis, this home visit was done via telephone due to the patient's inability to connect via an audiovisual connection or their refusal to have an in-person visit. This connection was agreed to by the patient. Verified that I am speaking with the correct person using two identifiers.                                  ASSESSMENT AND PLAN / RECOMMENDATIONS:   Advance Care Planning/Goals of Care: Goals include to maximize quality of life and symptom management. Patient/health care surrogate gave his/her permission to discuss. Our advance care planning conversation included a discussion about:    The value and importance of advance care planning  Experiences with loved ones who have been seriously ill or have died  Exploration of personal, cultural or spiritual beliefs that might influence medical decisions  Exploration of goals of care in the event of a sudden injury or illness   CODE STATUS: Full Code  Symptom Management/Plan:  Chronic left foot vascular wound, hx of osteomyelitis- patient reports having chills, drop in blood pressure yesterday. Per patient and wife, [facility] nurse states wound was increasing in size on Monday when she changed dressing. He had been receiving Vancomycin on HD days; unclear if he still  receiving vancomycin. Given acute symptoms, patient will be going to ED further evaluation.   ESRD-continue HD on Tuesday/Thursday/Saturday as directed. Patient has not started taking his Renvela; education provided on phosphate binders. Routine labs per dialysis.   Pain-chronic pain; pain managed with current regimen. Continue fentanyl 50 mcg every 72 hours, percocet TID PRN, Lyrica 100 mg BID directed. Will send script.  Nausea-patient reports worsening nausea; has taken Zofran this morning. He has new prescription for metoclopramide 5 mg QID from recent hospitalization; education provided and he is instructed to start taking.   Follow up Palliative Care Visit: Palliative care will continue to follow for complex medical decision making, advance care planning, and clarification of goals. Return in 4 weeks or prn.   This visit was coded based on medical decision making (MDM).  PPS: 40%  HOSPICE ELIGIBILITY/DIAGNOSIS: TBD  Chief Complaint: Palliative Medicine follow up visit.  HISTORY OF PRESENT ILLNESS:  [name] is a 66 y.o. year old male  with ESRD on hemodialysis T-TH-Saturday, chronic diastolic CHF, paroxysmal atrial fibrillation, type 2 diabetes, hypertension, hyperlipidemia, CAD, hx of PCI and stent, severe PAD, right BKA with multiple toes amputated on the left foot, chronic left vascular wound, osteomyelitis, chronic pain, OA, sinus bradycardia, anemia.  Recent hospitalization 5/4-01/14/22 due to left foot ischemic ulcer infection.  Patient reports worsening nausea, had chills yesterday. He also states his blood pressure dropped while at hemodialysis yesterday, also felt like he  was going to pass out. He has been receiving HH SN for wound care. Per patient and wife his wound had increased in size on Monday. Patient with generalized pain, now endorses pain to left hip. He states he has not started taking the phosphate binder. A 10-point ROS is negative, except for the pertinent  positives and negatives detailed per the HPI.   History obtained from review of EMR, discussion with primary team, and interview with family, facility staff/caregiver and/or [name].  I reviewed available labs, medications, imaging, studies and related documents from the EMR.  Records reviewed and summarized above.   ROS  General: nauseated, not feeling well EYES: denies vision changes ENMT: denies dysphagia Cardiovascular: denies chest pain, denies DOE Pulmonary: denies increased SOB Abdomen: endorses continence of bowel GU: varies in amount MSK:  increased weakness Skin: wound to left foot Neurological: generalized pain Psych: Endorses stable mood Heme/lymph/immuno: denies bruises, abnormal bleeding  Physical Exam:  PE deferred d/t this being a telemedicine visit.    Thank you for the opportunity to participate in the care of Mr. Bozard.  The palliative care team will continue to follow. Please call our office at [phone] if we can be of additional assistance.   Ezekiel Slocumb, NP   COVID-19 PATIENT SCREENING TOOL Asked and negative response unless otherwise noted:   Have you had symptoms of covid, tested positive or been in contact with someone with symptoms/positive test in the past 5-10 days? No

## 2022-02-14 NOTE — ED Notes (Signed)
This RN cleaned and dressed pt's wounds.

## 2022-02-14 NOTE — ED Triage Notes (Signed)
Patient arrived by [facility] from home to be checked for infected wounds. Left foot, left hip and 2 on buttocks-reports wounds for several months. Last dialysis yesterday. Patient has hx of sepsis, alert and oriented. Patient received NS 400 pta

## 2022-02-14 NOTE — Discharge Instructions (Addendum)
Please return to the ED with any new symptoms such as fevers Please read the attached informational guide concerning pressure injuries as well as ways to prevent pressure injuries Please follow-up with your PCP for any ongoing needs Please continue taking your Zofran for nausea at home Please continue having wound care come out to attend to the pressure ulcer on your left foot.  Please make sure that you mention your pressure ulcers on your sacral region as well.

## 2022-02-14 NOTE — Progress Notes (Signed)
[facility] WPT [facility]) Hospital Liaison note:  This patient is currently enrolled in [facility] outpatient-based Palliative Care. Will continue to follow for disposition.  Please call with any outpatient palliative questions or concerns.  Thank you, Lorelee Market, LPN [facility] Hospital Liaison [phone]

## 2022-02-15 DIAGNOSIS — M869 Osteomyelitis, unspecified: Secondary | ICD-10-CM | POA: Diagnosis not present

## 2022-02-15 DIAGNOSIS — Z7401 Bed confinement status: Secondary | ICD-10-CM | POA: Diagnosis not present

## 2022-02-15 DIAGNOSIS — Z992 Dependence on renal dialysis: Secondary | ICD-10-CM | POA: Diagnosis not present

## 2022-02-15 DIAGNOSIS — N2581 Secondary hyperparathyroidism of renal origin: Secondary | ICD-10-CM | POA: Diagnosis not present

## 2022-02-15 DIAGNOSIS — R6889 Other general symptoms and signs: Secondary | ICD-10-CM | POA: Diagnosis not present

## 2022-02-15 DIAGNOSIS — N186 End stage renal disease: Secondary | ICD-10-CM | POA: Diagnosis not present

## 2022-02-15 DIAGNOSIS — I499 Cardiac arrhythmia, unspecified: Secondary | ICD-10-CM | POA: Diagnosis not present

## 2022-02-15 DIAGNOSIS — D509 Iron deficiency anemia, unspecified: Secondary | ICD-10-CM | POA: Diagnosis not present

## 2022-02-15 NOTE — ED Notes (Signed)
Patient verbalizes understanding of d/c instructions. Opportunities for questions and answers were provided. Pt d/c from ED and transported home via [place].

## 2022-02-16 ENCOUNTER — Telehealth: Payer: Self-pay | Admitting: Orthopedic Surgery

## 2022-02-16 NOTE — Telephone Encounter (Signed)
Gretchen called. She is the home nurse working with patient. He would like for her to come on 6/14 and not today. She would like the Ok to change visit day. Her call back number is [phone]

## 2022-02-16 NOTE — Telephone Encounter (Signed)
Called lm on vm to advise if the pt will not allow her to come that is fine. He has an appt for 02/19/22 with [name] and we can do dressing change.

## 2022-02-17 DIAGNOSIS — M869 Osteomyelitis, unspecified: Secondary | ICD-10-CM | POA: Diagnosis not present

## 2022-02-17 DIAGNOSIS — Z992 Dependence on renal dialysis: Secondary | ICD-10-CM | POA: Diagnosis not present

## 2022-02-17 DIAGNOSIS — D509 Iron deficiency anemia, unspecified: Secondary | ICD-10-CM | POA: Diagnosis not present

## 2022-02-17 DIAGNOSIS — N186 End stage renal disease: Secondary | ICD-10-CM | POA: Diagnosis not present

## 2022-02-17 DIAGNOSIS — N2581 Secondary hyperparathyroidism of renal origin: Secondary | ICD-10-CM | POA: Diagnosis not present

## 2022-02-19 ENCOUNTER — Encounter (INDEPENDENT_AMBULATORY_CARE_PROVIDER_SITE_OTHER): Payer: Medicare HMO | Admitting: Ophthalmology

## 2022-02-19 ENCOUNTER — Ambulatory Visit (INDEPENDENT_AMBULATORY_CARE_PROVIDER_SITE_OTHER): Payer: Medicare HMO | Admitting: Orthopedic Surgery

## 2022-02-19 DIAGNOSIS — L97521 Non-pressure chronic ulcer of other part of left foot limited to breakdown of skin: Secondary | ICD-10-CM

## 2022-02-19 DIAGNOSIS — I87332 Chronic venous hypertension (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: Secondary | ICD-10-CM | POA: Diagnosis not present

## 2022-02-20 DIAGNOSIS — Z992 Dependence on renal dialysis: Secondary | ICD-10-CM | POA: Diagnosis not present

## 2022-02-20 DIAGNOSIS — M869 Osteomyelitis, unspecified: Secondary | ICD-10-CM | POA: Diagnosis not present

## 2022-02-20 DIAGNOSIS — N2581 Secondary hyperparathyroidism of renal origin: Secondary | ICD-10-CM | POA: Diagnosis not present

## 2022-02-20 DIAGNOSIS — D509 Iron deficiency anemia, unspecified: Secondary | ICD-10-CM | POA: Diagnosis not present

## 2022-02-20 DIAGNOSIS — N186 End stage renal disease: Secondary | ICD-10-CM | POA: Diagnosis not present

## 2022-02-20 DIAGNOSIS — D631 Anemia in chronic kidney disease: Secondary | ICD-10-CM | POA: Diagnosis not present

## 2022-02-21 ENCOUNTER — Telehealth: Payer: Self-pay | Admitting: Orthopedic Surgery

## 2022-02-21 NOTE — Telephone Encounter (Signed)
Gretchen (PT) from [facility] called for clarification of weight baring on left foot with wound. Also asking verbal orders for 1wk 2. Please call Gretchen at [phone].

## 2022-02-22 DIAGNOSIS — Z992 Dependence on renal dialysis: Secondary | ICD-10-CM | POA: Diagnosis not present

## 2022-02-22 DIAGNOSIS — N186 End stage renal disease: Secondary | ICD-10-CM | POA: Diagnosis not present

## 2022-02-22 DIAGNOSIS — D509 Iron deficiency anemia, unspecified: Secondary | ICD-10-CM | POA: Diagnosis not present

## 2022-02-22 DIAGNOSIS — D631 Anemia in chronic kidney disease: Secondary | ICD-10-CM | POA: Diagnosis not present

## 2022-02-22 DIAGNOSIS — M869 Osteomyelitis, unspecified: Secondary | ICD-10-CM | POA: Diagnosis not present

## 2022-02-22 DIAGNOSIS — N2581 Secondary hyperparathyroidism of renal origin: Secondary | ICD-10-CM | POA: Diagnosis not present

## 2022-02-23 ENCOUNTER — Encounter (HOSPITAL_COMMUNITY): Payer: Self-pay

## 2022-02-23 NOTE — Telephone Encounter (Signed)
Called to give verbal ok for orders below. Advised non weight bearing on the left or at least wtb through heel for tranfers only.

## 2022-02-24 DIAGNOSIS — D631 Anemia in chronic kidney disease: Secondary | ICD-10-CM | POA: Diagnosis not present

## 2022-02-24 DIAGNOSIS — M869 Osteomyelitis, unspecified: Secondary | ICD-10-CM | POA: Diagnosis not present

## 2022-02-24 DIAGNOSIS — N2581 Secondary hyperparathyroidism of renal origin: Secondary | ICD-10-CM | POA: Diagnosis not present

## 2022-02-24 DIAGNOSIS — Z992 Dependence on renal dialysis: Secondary | ICD-10-CM | POA: Diagnosis not present

## 2022-02-24 DIAGNOSIS — D509 Iron deficiency anemia, unspecified: Secondary | ICD-10-CM | POA: Diagnosis not present

## 2022-02-24 DIAGNOSIS — N186 End stage renal disease: Secondary | ICD-10-CM | POA: Diagnosis not present

## 2022-02-26 ENCOUNTER — Ambulatory Visit (INDEPENDENT_AMBULATORY_CARE_PROVIDER_SITE_OTHER): Payer: Medicare HMO | Admitting: Ophthalmology

## 2022-02-26 ENCOUNTER — Encounter (INDEPENDENT_AMBULATORY_CARE_PROVIDER_SITE_OTHER): Payer: Self-pay | Admitting: Ophthalmology

## 2022-02-26 DIAGNOSIS — E113512 Type 2 diabetes mellitus with proliferative diabetic retinopathy with macular edema, left eye: Secondary | ICD-10-CM | POA: Diagnosis not present

## 2022-02-26 DIAGNOSIS — E113511 Type 2 diabetes mellitus with proliferative diabetic retinopathy with macular edema, right eye: Secondary | ICD-10-CM | POA: Diagnosis not present

## 2022-02-26 DIAGNOSIS — H4311 Vitreous hemorrhage, right eye: Secondary | ICD-10-CM

## 2022-02-26 MED ORDER — AFLIBERCEPT 2MG/0.05ML IZ SOLN FOR KALEIDOSCOPE
2.0000 mg | INTRAVITREAL | Status: AC | PRN
  Administered 2022-02-26: 2 mg via INTRAVITREAL

## 2022-02-26 NOTE — Assessment & Plan Note (Signed)
>>  ASSESSMENT AND PLAN FOR VITREOUS HEMORRHAGE OF RIGHT EYE (HCC) WRITTEN ON 02/26/2022  3:35 PM BY ELNER KUBA A, MD  Clearing symptomatically and by findings post injection into vegF, repeat today

## 2022-02-26 NOTE — Assessment & Plan Note (Signed)
Post recent PRP OS to quiet down the PDR, no active CSME OS

## 2022-02-26 NOTE — Assessment & Plan Note (Signed)
Clearing symptomatically and by findings post injection into vegF, repeat today

## 2022-02-26 NOTE — Assessment & Plan Note (Signed)
Likely stabilizing with less macular edema on OCT and less hemorrhage post Eylea injection.  We will repeat injection today for CSME stability but also to quiet the diabetic hemorrhage, active PDR

## 2022-02-26 NOTE — Progress Notes (Signed)
02/26/2022     CHIEF COMPLAINT Patient presents for  Chief Complaint  Patient presents with   Diabetic Retinopathy with Macular Edema      HISTORY OF PRESENT ILLNESS: [name] is a 66 y.o. male who presents to the clinic today for:   HPI   1 month OD Eylea OCT (good days approved). Patient states "the blood that was in my eyes I think is mostly gone. Vision is a little better."  Last edited by Laurin Coder on 02/26/2022  3:06 PM.      Referring physician: [name], PA-C [place],   [id number]  HISTORICAL INFORMATION:   Selected notes from the MEDICAL RECORD NUMBER    Lab Results  Component Value Date   HGBA1C 7.6 (H) 01/11/2022     CURRENT MEDICATIONS: Current Outpatient Medications (Ophthalmic Drugs)  Medication Sig   Polyvinyl Alcohol-Povidone PF 1.4-0.6 % SOLN Place 1 drop into both eyes 2 (two) times daily as needed (dry eyes).    No current facility-administered medications for this visit. (Ophthalmic Drugs)   Current Outpatient Medications (Other)  Medication Sig   amiodarone (PACERONE) 200 MG tablet TAKE 2 TABLETS DAILY FOR 10 DAYS THEN 1 TABLET DAILY (Patient taking differently: Take 200 mg by mouth daily.)   amLODipine (NORVASC) 5 MG tablet Take 5 mg by mouth daily.   apixaban (ELIQUIS) 5 MG TABS tablet Take 1 tablet (5 mg total) by mouth 2 (two) times daily.   atorvastatin (LIPITOR) 40 MG tablet Take 40 mg by mouth at bedtime.    B Complex-C-Zn-Folic Acid (DIALYVITE/ZINC) TABS Take 1 tablet by mouth daily.   doxycycline (VIBRA-TABS) 100 MG tablet Take 100 mg by mouth See admin instructions. Bid x 30 days   fentaNYL (DURAGESIC) 50 MCG/HR Place 1 patch onto the skin every 3 (three) days.   folic acid (FOLVITE) 1 MG tablet Take 1 mg by mouth daily.   Glucosamine HCl (GLUCOSAMINE PO) Take 1 tablet by mouth 2 (two) times daily.   HUMALOG KWIKPEN 100 UNIT/ML KwikPen Inject 4 Units into the skin 3 (three) times daily.  (Patient taking differently: Inject 13 Units into the skin 3 (three) times daily.)   insulin glargine (LANTUS) 100 UNIT/ML injection Inject 0.1 mLs (10 Units total) into the skin at bedtime. (Patient taking differently: Inject 30 Units into the skin at bedtime.)   ketoconazole (NIZORAL) 2 % cream Apply 1 application topically 2 (two) times daily as needed for irritation.    lidocaine-prilocaine (EMLA) cream Apply 1 application topically See admin instructions. Prior to Dialysis days Tuesday,Thursday and saturday   linaclotide (LINZESS) 145 MCG CAPS capsule Take 145 mcg by mouth daily as needed (constipation).    loratadine (CLARITIN) 10 MG tablet Take 10 mg by mouth daily.   metoCLOPramide (REGLAN) 5 MG tablet Take 1 tablet (5 mg total) by mouth 4 (four) times daily -  before meals and at bedtime. (Patient taking differently: Take 5 mg by mouth 3 (three) times daily before meals.)   midodrine (PROAMATINE) 10 MG tablet Take 1 tablet (10 mg total) by mouth every other day. One hour before dialysis   NEEDLE, REUSABLE, 22 G 22G X 1-1/2" MISC 1 Units by Does not apply route as directed. For B12 IM inj   nitroGLYCERIN (NITRODUR - DOSED IN MG/24 HR) 0.2 mg/hr patch Place 1 patch (0.2 mg total) onto the skin daily.   nitroGLYCERIN (NITROSTAT) 0.4 MG SL tablet Place 0.4 mg under the tongue  every 5 (five) minutes as needed for chest pain.   omega-3 acid ethyl esters (LOVAZA) 1 g capsule Take 2 g by mouth 2 (two) times daily.   ondansetron (ZOFRAN) 4 MG tablet Take 4 mg by mouth every 6 (six) hours as needed for nausea or vomiting.   oxyCODONE-acetaminophen (PERCOCET) 7.5-325 MG tablet Take 1 tablet by mouth 3 (three) times daily as needed for moderate pain. (Patient taking differently: Take 1 tablet by mouth every 8 (eight) hours.)   pantoprazole (PROTONIX) 40 MG tablet Take 1 tablet (40 mg total) by mouth 2 (two) times daily for 30 days, THEN 1 tablet (40 mg total) daily.   pregabalin (LYRICA) 100 MG capsule  Take 100 mg by mouth 2 (two) times daily.   rOPINIRole (REQUIP) 2 MG tablet Take 2 mg by mouth at bedtime.   sevelamer carbonate (RENVELA) 800 MG tablet Take 800 mg by mouth 3 (three) times daily.   silver sulfADIAZINE (SILVADENE) 1 % cream Apply topically daily.   Syringe/Needle, Disp, (SYRINGE 3CC/22GX1-1/2") 22G X 1-1/2" 3 ML MISC 1 Syringe by Does not apply route as directed. For b12 IM inj   tamsulosin (FLOMAX) 0.4 MG CAPS capsule Take 0.4 mg by mouth daily.   No current facility-administered medications for this visit. (Other)      REVIEW OF SYSTEMS: ROS   Negative for: Constitutional, Gastrointestinal, Neurological, Skin, Genitourinary, Musculoskeletal, HENT, Endocrine, Cardiovascular, Eyes, Respiratory, Psychiatric, Allergic/Imm, Heme/Lymph Last edited by Hurman Horn, MD on 02/26/2022  3:33 PM.       ALLERGIES Allergies  Allergen Reactions   Contrast Media [Iodinated Contrast Media] Shortness Of Breath and Other (See Comments)    Difficulty breathing and altered mental status     Ivp Dye [Iodinated Contrast Media] Anaphylaxis, Shortness Of Breath and Other (See Comments)    Breathing problems, altered mental state    Adhesive [Tape] Rash and Other (See Comments)    Rash after 1 day of use   Latex Rash and Other (See Comments)    A severe rash appears after the first 24 hours of being placed    PAST MEDICAL HISTORY Past Medical History:  Diagnosis Date   Carotid artery occlusion 11/10/10   LEFT CAROTID ENDARTERECTOMY   Chronic kidney disease    Complication of anesthesia    BP WENT UP AT [facility] "   COPD (chronic obstructive pulmonary disease) ([place])    pt denies this dx as of 06/01/20 - no inhaler    Diabetes mellitus without complication (HCC)    Diverticulitis    Diverticulosis of colon (without mention of hemorrhage)    DJD (degenerative joint disease)    knees/hands/feet/back/neck   Fatty liver    Full dentures    GERD (gastroesophageal reflux disease)     H/O hiatal hernia    History of blood transfusion    with a past surical procedure per patient 06/01/20   Hyperlipidemia    Hypertension    Neuromuscular disorder ([place])    peripheral neuropathy   Non-pressure chronic ulcer of other part of left foot limited to breakdown of skin ([place]) 11/12/2016   Osteomyelitis (HCC)    left 5th metatarsal   PAD (peripheral artery disease) ([place])    Distal aortogram June 2012. Atherectomy left popliteal artery July 2012.    Pseudoclaudication 11/15/2018   Sleep apnea    pt denies this dx as of 06/01/20   Slurred speech    AS PER WIFE IN D/C NOTE 11/10/10   Trifascicular  block 11/15/2018   Unstable angina ([place]) 09/16/2018   Wears glasses    Past Surgical History:  Procedure Laterality Date   ABDOMINAL AORTOGRAM W/LOWER EXTREMITY N/A 06/23/2021   Procedure: ABDOMINAL AORTOGRAM W/LOWER EXTREMITY;  Surgeon: Nigel Mormon, MD;  Location: [place] [facility];  Service: Cardiovascular;  Laterality: N/A;   AMPUTATION  11/05/2011   Procedure: AMPUTATION RAY;  Surgeon: Wylene Simmer, MD;  Location: [place];  Service: Orthopedics;  Laterality: Right;  Amputation of Right 4&5th Toes   AMPUTATION Left 11/26/2012   Procedure: AMPUTATION RAY;  Surgeon: Wylene Simmer, MD;  Location: [place];  Service: Orthopedics;  Laterality: Left;  fourth ray amputation   AMPUTATION Right 08/27/2014   Procedure: Transmetatarsal Amputation;  Surgeon: Newt Minion, MD;  Location: [place];  Service: Orthopedics;  Laterality: Right;   AMPUTATION Right 01/14/2015   Procedure: AMPUTATION BELOW KNEE;  Surgeon: Newt Minion, MD;  Location: [place];  Service: Orthopedics;  Laterality: Right;   AMPUTATION Left 10/21/2015   Procedure: Left Foot 5th Ray Amputation;  Surgeon: Newt Minion, MD;  Location: [place];  Service: Orthopedics;  Laterality: Left;   ANTERIOR FUSION CERVICAL SPINE  02/06/06   C4-5, C5-6, C6-7; SURGEON DR. MAX COHEN   AV FISTULA PLACEMENT Left 06/02/2020   Procedure: ARTERIOVENOUS (AV)  FISTULA CREATION LEFT;  Surgeon: Waynetta Sandy, MD;  Location: [place];  Service: Vascular;  Laterality: Left;   BACK SURGERY     x 3   [place] Left 07/21/2020   Procedure: LEFT UPPER ARM ATERIOVENOUS SUPERFISTULALIZATION;  Surgeon: Waynetta Sandy, MD;  Location: [place];  Service: Vascular;  Laterality: Left;   BELOW KNEE LEG AMPUTATION Right    CARDIAC CATHETERIZATION  10/31/04   2009   CAROTID ENDARTERECTOMY  11/10/10   CAROTID ENDARTERECTOMY Left 11/10/2010   Subtotal occlusion of left internal carotid artery with left hemispheric transient ischemic attacks.   CAROTID STENT     CARPAL TUNNEL RELEASE Right 10/21/2013   Procedure: RIGHT CARPAL TUNNEL RELEASE;  Surgeon: Wynonia Sours, MD;  Location: [place];  Service: Orthopedics;  Laterality: Right;   CHOLECYSTECTOMY     COLON SURGERY     COLONOSCOPY     COLOSTOMY REVERSAL  05/21/2018   ileostomy reversal   CYSTOSCOPY WITH STENT PLACEMENT Bilateral 01/13/2018   Procedure: CYSTOSCOPY WITH BILATERAL URETERAL CATHETER PLACEMENT;  Surgeon: Ardis Hughs, MD;  Location: [facility];  Service: Urology;  Laterality: Bilateral;   ESOPHAGEAL MANOMETRY Bilateral 07/19/2014   Procedure: ESOPHAGEAL MANOMETRY (EM);  Surgeon: Jerene Bears, MD;  [facility];  Service: Gastroenterology;  Laterality: Bilateral;   EYE SURGERY Bilateral 2020   cataract   FEMORAL ARTERY STENT     x6   FINGER SURGERY     FOOT SURGERY  04/25/2016    EXCISION BASE 5TH METATARSAL AND PARTIAL CUBOID LEFT FOOT   HERNIA REPAIR     LEFT INGUINAL AND UMBILICAL REPAIRS   HERNIA REPAIR     I & D EXTREMITY Left 04/25/2016   Procedure: EXCISION BASE 5TH METATARSAL AND PARTIAL CUBOID LEFT FOOT;  Surgeon: Newt Minion, MD;  Location: [place];  Service: Orthopedics;  Laterality: Left;   ILEOSTOMY  01/13/2018   Procedure: ILEOSTOMY;  Surgeon: Clovis Riley, MD;  [facility];  Service: General;;   ILEOSTOMY CLOSURE N/A  05/21/2018   Procedure: ILEOSTOMY REVERSAL ERAS PATHWAY;  Surgeon: Clovis Riley, MD;  Location: [place];  Service: General;  Laterality: N/A;   IR RADIOLOGIST EVAL & MGMT  11/19/2017   IR RADIOLOGIST EVAL & MGMT  12/03/2017   IR RADIOLOGIST EVAL & MGMT  12/18/2017   JOINT REPLACEMENT Right 2001   Total knee   LAMINECTOMY     X 3 LUMBAR AND X 2 CERVICAL SPINE OPERATIONS   LAPAROSCOPIC CHOLECYSTECTOMY W/ CHOLANGIOGRAPHY  11/09/04   SURGEON DR. [place] CATH AND CORONARY ANGIOGRAPHY N/A 09/16/2018   Procedure: LEFT HEART CATH AND CORONARY ANGIOGRAPHY;  Surgeon: Nigel Mormon, MD;  Location: [place] [facility];  Service: Cardiovascular;  Laterality: N/A;   LEFT HEART CATHETERIZATION WITH CORONARY ANGIOGRAM N/A 10/29/2014   Procedure: LEFT HEART CATHETERIZATION WITH CORONARY ANGIOGRAM;  Surgeon: Laverda Page, MD;  Location: [facility] C[facility];  Service: Cardiovascular;  Laterality: N/A;   LIGATION OF COMPETING BRANCHES OF ARTERIOVENOUS FISTULA Left 07/21/2020   Procedure: LIGATION OF COMPETING BRANCHES OF LEFT UPPER ARM ARTERIOVENOUS FISTULA;  Surgeon: Waynetta Sandy, MD;  Location: [place];  Service: Vascular;  Laterality: Left;   LOWER EXTREMITY ANGIOGRAM N/A 03/19/2012   Procedure: LOWER EXTREMITY ANGIOGRAM;  Surgeon: Burnell Blanks, MD;  [facility];  Service: Cardiovascular;  Laterality: N/A;   LOWER EXTREMITY ANGIOGRAPHY N/A 06/20/2021   Procedure: LOWER EXTREMITY ANGIOGRAPHY;  Surgeon: Nigel Mormon, MD;  Location: [place] [facility];  Service: Cardiovascular;  Laterality: N/A;   NECK SURGERY     PARTIAL COLECTOMY N/A 01/13/2018   Procedure: LAPAROSCOPIC ASSISTED   SIGMOID COLECTOMY ILEOSTOMY;  Surgeon: Clovis Riley, MD;  [facility];  Service: General;  Laterality: N/A;   PENILE PROSTHESIS IMPLANT  08/14/05   INFRAPUBIC INSERTION OF INFLATABLE PENILE PROSTHESIS; SURGEON DR. Amalia Hailey   PENILE PROSTHESIS IMPLANT     PERCUTANEOUS  CORONARY STENT INTERVENTION (PCI-S) Right 10/29/2014   Procedure: PERCUTANEOUS CORONARY STENT INTERVENTION (PCI-S);  Surgeon: Laverda Page, MD;  Location: [facility] CATH LAB;  Service: Cardiovascular;  Laterality: Right;   PERIPHERAL VASCULAR INTERVENTION Left 06/23/2021   Procedure: PERIPHERAL VASCULAR INTERVENTION;  Surgeon: Nigel Mormon, MD;  Location: [place] [facility];  Service: Cardiovascular;  Laterality: Left;   REMOVAL OF PENILE PROSTHESIS N/A 06/14/2021   Procedure: Removal of THREE piece inflatable penile prosthesis;  Surgeon: Lucas Mallow, MD;  Location: [place];  Service: Urology;  Laterality: N/A;   SHOULDER ARTHROSCOPY     SPINE SURGERY     TOE AMPUTATION Left    TONSILLECTOMY     TOTAL KNEE ARTHROPLASTY  07/2002   RIGHT KNEE ; SURGEON  DR. [name] ALSO HAD ARTHROSCOPIC RIGHT KNEE IN  10/2001   TOTAL KNEE ARTHROPLASTY     ULNAR NERVE TRANSPOSITION Right 10/21/2013   Procedure: RIGHT ELBOW  ULNAR NERVE DECOMPRESSION;  Surgeon: Wynonia Sours, MD;  Location: [place];  Service: Orthopedics;  Laterality: Right;    FAMILY HISTORY Family History  Problem Relation Age of Onset   Heart disease Father        Before age 26-  CAD, BPG   Diabetes Father        Amputation   Cancer Father        PROSTATE   Hyperlipidemia Father    Hypertension Father    Heart attack Father        Triple BPG   Varicose Veins Father    Cancer Sister        Breast   Hyperlipidemia Sister  Hypertension Sister    Heart attack Brother    Colon cancer Brother    Diabetes Brother    Heart disease Brother 32       A-Fib. Before age 33   Hyperlipidemia Brother    Hypertension Brother    Hypertension Son    Arthritis Other        GRANDMOTHER   Hypertension Other        OTHER FAMILY MEMBERS    SOCIAL HISTORY Social History   Tobacco Use   Smoking status: Former    Packs/day: 2.00    Years: 35.00    Total pack years: 70.00    Types: Cigarettes    Quit date:  10/28/2011    Years since quitting: 10.3   Smokeless tobacco: Never  Vaping Use   Vaping Use: Some days   Substances: Nicotine  Substance Use Topics   Alcohol use: Not Currently    Comment: "not in a long time"   Drug use: Never         OPHTHALMIC EXAM:  Base Eye Exam     Visual Acuity (ETDRS)       Right Left   Dist [place] 20/200 20/20 -2   Dist ph [place] NI          Tonometry (Tonopen, 3:09 PM)       Right Left   Pressure 7 7         Pupils       Pupils Dark Light React APD   Right PERRL 3 2  None   Left PERRL 2 2 Minimal None         Extraocular Movement       Right Left    Full Full         Neuro/Psych     Oriented x3: Yes   Mood/Affect: Normal         Dilation     Right eye: 1.0% Mydriacyl, 2.5% Phenylephrine @ 3:09 PM           Slit Lamp and Fundus Exam     External Exam       Right Left   External Normal Normal         Slit Lamp Exam       Right Left   Lids/Lashes Normal Normal   Conjunctiva/Sclera White and quiet White and quiet   Cornea Clear Clear   Anterior Chamber Deep and quiet Deep and quiet   Iris Round and reactive Round and reactive   Lens Posterior chamber intraocular lens Posterior chamber intraocular lens   Anterior Vitreous Normal Normal         Fundus Exam       Right Left   Posterior Vitreous Normal    Disc Normal    C/D Ratio 0.4    Macula Exudates, Microaneurysms, no apparent CSME    Vessels NPDR severe, with numerous dot blot hemorrhages, VCABS, [place].  Some evidence of incomplete central retinal vein occlusion    Periphery Normal, good PRP 360 yet large dot blot hemorrhages are noted temporally             IMAGING AND PROCEDURES  Imaging and Procedures for 02/26/22  OCT, Retina - OU - Both Eyes       Right Eye Quality was good. Central Foveal Thickness: 235. Progression has improved. Findings include abnormal foveal contour, cystoid macular edema.   Left Eye Quality was good. Scan  locations included subfoveal. Central Foveal Thickness: 289.  Progression has been stable. Findings include normal foveal contour.   Notes OD now improved view through the vitreous hemorrhage as it is clearing post injection antivegF.  Will need repeat injection antivegF to clear the residual CSME OD   OS normal     Intravitreal Injection, Pharmacologic Agent - OD - Right Eye       Time Out 02/26/2022. 3:36 PM. Confirmed correct patient, procedure, site, and patient consented.   Anesthesia Topical anesthesia was used. Anesthetic medications included Lidocaine 4%.   Procedure Preparation included 5% betadine to ocular surface, 10% betadine to eyelids, Ofloxacin . A 30 gauge needle was used.   Injection: 2 mg aflibercept 2 MG/0.05ML   Route: Intravitreal, Site: Right Eye   NDC: [id number], Lot: [id number], Waste: 0 mL   Post-op Post injection exam found visual acuity of at least counting fingers. The patient tolerated the procedure well. There were no complications. The patient received written and verbal post procedure care education. Post injection medications included ocuflox.              ASSESSMENT/PLAN:  Diabetic macular edema of right eye with proliferative retinopathy associated with type 2 diabetes mellitus (HCC) Likely stabilizing with less macular edema on OCT and less hemorrhage post Eylea injection.  We will repeat injection today for CSME stability but also to quiet the diabetic hemorrhage, active PDR  Vitreous hemorrhage of right eye ([place]) Clearing symptomatically and by findings post injection into vegF, repeat today  Proliferative diabetic retinopathy of left eye with macular edema associated with type 2 diabetes mellitus ([place]) Post recent PRP OS to quiet down the PDR, no active CSME OS     ICD-10-CM   1. Diabetic macular edema of right eye with proliferative retinopathy associated with type 2 diabetes mellitus (HCC)  E11.3511 OCT, Retina - OU - Both  Eyes    Intravitreal Injection, Pharmacologic Agent - OD - Right Eye    aflibercept (EYLEA) SOLN 2 mg    2. Vitreous hemorrhage of right eye ([place])  H43.11     3. Proliferative diabetic retinopathy of left eye with macular edema associated with type 2 diabetes mellitus ([place])  T02.4097       1.  Vitreous hemorrhage OD clearing as treatment underway for PDR with CSME.  PDR progression likely also halted for the time being.  If vitreous hemorrhage does not clear with further injections, may need vitrectomy in the future OD  2.  OD, improving CSME  3.  OS post recent PRP to control PDR progression and to maintain the absence of maculopathy OS  Ophthalmic Meds Ordered this visit:  Meds ordered this encounter  Medications   aflibercept (EYLEA) SOLN 2 mg       Return in about 5 weeks (around 04/02/2022) for dilate, OD, EYLEA OCT.  There are no Patient Instructions on file for this visit.   Explained the diagnoses, plan, and follow up with the patient and they expressed understanding.  Patient expressed understanding of the importance of proper follow up care.   Clent Demark Joash Tony M.D. Diseases & Surgery of the Retina and Vitreous Retina & Diabetic [place] 02/26/22     Abbreviations: M myopia (nearsighted); A astigmatism; H hyperopia (farsighted); P presbyopia; Mrx spectacle prescription;  CTL contact lenses; OD right eye; OS left eye; OU both eyes  XT exotropia; ET esotropia; PEK punctate epithelial keratitis; PEE punctate epithelial erosions; DES dry eye syndrome; MGD meibomian gland dysfunction; ATs artificial tears; PFAT's preservative free artificial tears;  [place] nuclear sclerotic cataract; PSC posterior subcapsular cataract; ERM epi-retinal membrane; PVD posterior vitreous detachment; RD retinal detachment; DM diabetes mellitus; DR diabetic retinopathy; NPDR non-proliferative diabetic retinopathy; PDR proliferative diabetic retinopathy; CSME clinically significant macular edema; DME  diabetic macular edema; dbh dot blot hemorrhages; CWS cotton wool spot; POAG primary open angle glaucoma; C/D cup-to-disc ratio; HVF humphrey visual field; GVF goldmann visual field; OCT optical coherence tomography; IOP intraocular pressure; BRVO Branch retinal vein occlusion; CRVO central retinal vein occlusion; CRAO central retinal artery occlusion; BRAO branch retinal artery occlusion; RT retinal tear; SB scleral buckle; PPV pars plana vitrectomy; VH Vitreous hemorrhage; PRP panretinal laser photocoagulation; IVK intravitreal kenalog; VMT vitreomacular traction; MH Macular hole;  NVD neovascularization of the disc; NVE neovascularization elsewhere; AREDS age related eye disease study; ARMD age related macular degeneration; POAG primary open angle glaucoma; EBMD epithelial/anterior basement membrane dystrophy; ACIOL anterior chamber intraocular lens; IOL intraocular lens; PCIOL posterior chamber intraocular lens; Phaco/IOL phacoemulsification with intraocular lens placement; [place] photorefractive keratectomy; LASIK laser assisted in situ keratomileusis; HTN hypertension; DM diabetes mellitus; COPD chronic obstructive pulmonary disease

## 2022-02-27 ENCOUNTER — Encounter: Payer: Self-pay | Admitting: Orthopedic Surgery

## 2022-02-27 DIAGNOSIS — M869 Osteomyelitis, unspecified: Secondary | ICD-10-CM | POA: Diagnosis not present

## 2022-02-27 DIAGNOSIS — N186 End stage renal disease: Secondary | ICD-10-CM | POA: Diagnosis not present

## 2022-02-27 DIAGNOSIS — D509 Iron deficiency anemia, unspecified: Secondary | ICD-10-CM | POA: Diagnosis not present

## 2022-02-27 DIAGNOSIS — N2581 Secondary hyperparathyroidism of renal origin: Secondary | ICD-10-CM | POA: Diagnosis not present

## 2022-02-27 DIAGNOSIS — Z992 Dependence on renal dialysis: Secondary | ICD-10-CM | POA: Diagnosis not present

## 2022-02-27 NOTE — Progress Notes (Signed)
Office Visit Note   Patient: [name]           Date of Birth: 1956/08/09           MRN: [id number] Visit Date: 02/19/2022              Requested by: [name], PA-C [address] [place],  [place] [address] PCP: [name], PA-C  Chief Complaint  Patient presents with   Left Foot - Wound Check      HPI: Patient is a 66 year old gentleman with venous stasis insufficiency and ischemic ulcer lateral aspect of the left foot.  Patient is having home health nurse dressing changes.   Assessment & Plan: Visit Diagnoses:  1. Chronic venous hypertension (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 (HCC)   2. Ischemic ulcer of left foot, limited to breakdown of skin [facility])     Plan: Continue with home health nursing dressing changes Monday and Friday Dynaflex applied today.  Follow-Up Instructions: Return in about 4 weeks (around 03/19/2022).   Ortho Exam  Patient is alert, oriented, no adenopathy, well-dressed, normal affect, normal respiratory effort. Examination patient's lateral wound shows improvement there is healthy granulation tissue it measures 1 x 5 cm.  There is no tunneling no depth good granulation tissue in the wound bed.  Imaging: Intravitreal Injection, Pharmacologic Agent - OD - Right Eye  Result Date: 02/26/2022 Time Out 02/26/2022. 3:36 PM. Confirmed correct patient, procedure, site, and patient consented. Anesthesia Topical anesthesia was used. Anesthetic medications included Lidocaine 4%. Procedure Preparation included 5% betadine to ocular surface, 10% betadine to eyelids, Ofloxacin . A 30 gauge needle was used. Injection: 2 mg aflibercept 2 MG/0.05ML   Route: Intravitreal, Site: Right Eye   NDC: [id number], Lot: [id number], Waste: 0 mL Post-op Post injection exam found visual acuity of at least counting fingers. The patient tolerated the procedure well. There were no complications. The patient received written and verbal post  procedure care education. Post injection medications included ocuflox.   OCT, Retina - OU - Both Eyes  Result Date: 02/26/2022 Right Eye Quality was good. Central Foveal Thickness: 235. Progression has improved. Findings include abnormal foveal contour, cystoid macular edema. Left Eye Quality was good. Scan locations included subfoveal. Central Foveal Thickness: 289. Progression has been stable. Findings include normal foveal contour. Notes OD now improved view through the vitreous hemorrhage as it is clearing post injection antivegF.  Will need repeat injection antivegF to clear the residual CSME OD OS normal    Labs: Lab Results  Component Value Date   HGBA1C 7.6 (H) 01/11/2022   HGBA1C 7.9 (H) 06/14/2021   HGBA1C 5.7 (H) 09/16/2018   ESRSEDRATE 40 (H) 01/13/2022   ESRSEDRATE 55 (H) 01/11/2022   ESRSEDRATE 40 (H) 09/03/2021   CRP 3.6 (H) 01/13/2022   CRP 4.2 (H) 01/11/2022   CRP 2.7 (H) 09/07/2021   REPTSTATUS 01/16/2022 FINAL 01/11/2022   GRAMSTAIN  06/14/2021    NO ORGANISMS SEEN SQUAMOUS EPITHELIAL CELLS PRESENT ABUNDANT WBC PRESENT,BOTH PMN AND MONONUCLEAR MODERATE GRAM POSITIVE COCCI    CULT  01/11/2022    NO GROWTH 5 DAYS Performed at [facility], [place] [address], [place], [place] [address]    LABORGA ENTEROCOCCUS FAECALIS 06/14/2021     Lab Results  Component Value Date   ALBUMIN 3.1 (L) 02/14/2022   ALBUMIN 2.6 (L) 01/14/2022   ALBUMIN 3.0 (L) 01/13/2022   PREALBUMIN 19.0 05/04/2016   PREALBUMIN 25.0 12/16/2015  Lab Results  Component Value Date   MG 1.8 01/14/2022   MG 1.9 05/24/2018   MG 2.1 05/23/2018   No results found for: "VD25OH"  Lab Results  Component Value Date   PREALBUMIN 19.0 05/04/2016   PREALBUMIN 25.0 12/16/2015      Latest Ref Rng & Units 02/14/2022   12:35 PM 01/14/2022    1:11 AM 01/13/2022    6:33 AM  CBC EXTENDED  WBC 4.0 - 10.5 K/uL 5.0  4.4  4.4   RBC 4.22 - 5.81 MIL/uL 3.47  2.96  3.53   Hemoglobin 13.0 - 17.0 g/dL  10.8  9.2  10.6   HCT 39.0 - 52.0 % 32.1  26.2  31.7   Platelets 150 - 400 K/uL 142  165  165   NEUT# 1.7 - 7.7 K/uL 3.3     Lymph# 0.7 - 4.0 K/uL 1.0        There is no height or weight on file to calculate BMI.  Orders:  No orders of the defined types were placed in this encounter.  No orders of the defined types were placed in this encounter.    Procedures: No procedures performed  Clinical Data: No additional findings.  ROS:  All other systems negative, except as noted in the HPI. Review of Systems  Objective: Vital Signs: There were no vitals taken for this visit.  Specialty Comments:  No specialty comments available.  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 ([place]) 11/27/2021   Vitreous hemorrhage of right eye ([place]) 04/23/4817   Acute metabolic encephalopathy 56/31/4970   COVID 09/02/2021   Sepsis ([place]) 09/02/2021   Severe sepsis with lactic acidosis ([place]) 06/14/2021   Infection associated with implanted penile prosthesis ([place]) 06/14/2021   ESRD on hemodialysis ([place]) 06/14/2021   Paroxysmal atrial fibrillation with RVR ([place]) 06/14/2021   Carotid stenosis 06/15/2020   Dyslipidemia 06/15/2020   Diabetic macular edema of right eye with proliferative retinopathy associated with type 2 diabetes mellitus ([place]) 01/18/2020   Severe nonproliferative diabetic retinopathy of left eye, with macular edema, associated with type 2 diabetes mellitus ([place]) 01/04/2020   Retinal hemorrhage of right eye 01/04/2020   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    Pseudoclaudication 11/15/2018   Essential hypertension 09/18/2018   S/P BKA (below knee amputation), right ([place]) 09/18/2018   Amputation of toe of left foot ([place]) 09/18/2018   Peripheral artery disease ([place]) 09/18/2018   S/P carotid endarterectomy 09/18/2018   OSA not tolerating CPAP 09/18/2018   Chronic back pain 09/18/2018   Status post reversal of ileostomy 05/21/2018   Normocytic anemia 02/14/2018   Intra-abdominal abscess ([place]) 11/04/2017   Chronic venous hypertension (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 ([place]) 12/11/2016   PAD (peripheral artery disease) ([place]) 11/12/2016   Unilateral primary osteoarthritis, left knee 10/11/2016   History of right below knee amputation ([place]) 07/12/2016   Infected diabetic left foot ulcer with chronic osteomyelitis    Diabetic foot infection ([place]) 05/04/2016   Insulin dependent type 2 diabetes mellitus ([place]) 12/16/2015   Amputated toe ([place]) 10/21/2015   Leg edema, left  09/03/2015   CAD (dz of distal, mid and proximal RCA with implantation of 3 overlapping drug-eluting stent,) 09/03/2015   Chronic diastolic heart failure, NYHA class 2 ([place]) 09/03/2015   Angina pectoris associated with type 2 diabetes mellitus ([place]) 10/28/2014   Hyperlipidemia 08/25/2014   Limb pain 03/20/2013   DJD (degenerative joint disease) 09/25/2012   Migraine 09/25/2012   Neuropathy 09/25/2012   Restless legs syndrome (RLS) 09/25/2012   Chronic obstructive pulmonary disease, unspecified ([place]) 04/25/2012   Unknown cause of morbidity or mortality 04/25/2012   Chronic total occlusion of artery of the extremities ([place]) 04/08/2012   Onychomycosis 02/01/2012   Occlusion and stenosis of carotid artery without mention of cerebral infarction 06/12/2011   GERD (gastroesophageal reflux disease) 05/08/2011   Barrett's esophagus without dysplasia 05/08/2011   Former tobacco use 02/01/2011   Past Medical History:  Diagnosis Date   Carotid artery occlusion  11/10/10   LEFT CAROTID ENDARTERECTOMY   Chronic kidney disease    Complication of anesthesia    BP WENT UP AT [facility] "   COPD (chronic obstructive pulmonary disease) ([place])    pt denies this dx as of 06/01/20 - no inhaler    Diabetes mellitus without complication ([place])    Diverticulitis    Diverticulosis of colon (without mention of hemorrhage)    DJD (degenerative joint disease)    knees/hands/feet/back/neck   Fatty liver    Full dentures    GERD (gastroesophageal reflux disease)    H/O hiatal hernia    History of blood transfusion    with a past surical procedure per patient 06/01/20   Hyperlipidemia    Hypertension    Neuromuscular disorder ([place])    peripheral neuropathy   Non-pressure chronic ulcer of other part of left foot limited to breakdown of skin ([place]) 11/12/2016   Osteomyelitis (HCC)    left 5th metatarsal   PAD (peripheral artery disease) ([place])    Distal aortogram June 2012. Atherectomy left popliteal artery July 2012.    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 Before age 5   Hyperlipidemia Brother    Hypertension Brother    Hypertension Son    Arthritis Other        GRANDMOTHER   Hypertension Other        OTHER FAMILY MEMBERS    Past Surgical History:  Procedure Laterality Date   ABDOMINAL AORTOGRAM W/LOWER  EXTREMITY N/A 06/23/2021   Procedure: ABDOMINAL AORTOGRAM W/LOWER EXTREMITY;  Surgeon: Nigel Mormon,  MD;  Location: [facility];  Service: Cardiovascular;  Laterality: N/A;   AMPUTATION  11/05/2011   Procedure: AMPUTATION RAY;  Surgeon: Wylene Simmer, MD;  Location: [facility];  Service: Orthopedics;  Laterality: Right;  Amputation of Right 4&5th Toes   AMPUTATION Left 11/26/2012   Procedure: AMPUTATION RAY;  Surgeon: Wylene Simmer, MD;  Location: [facility];  Service: Orthopedics;  Laterality: Left;  fourth ray amputation   AMPUTATION Right 08/27/2014   Procedure: Transmetatarsal Amputation;  Surgeon: Newt Minion, MD;  Location: [place] [place];  Service: Orthopedics;  Laterality: Right;   AMPUTATION Right 01/14/2015   Procedure: AMPUTATION BELOW KNEE;  Surgeon: Newt Minion, MD;  Location: [place] [place];  Service: Orthopedics;  Laterality: Right;   AMPUTATION Left 10/21/2015   Procedure: Left Foot 5th Ray Amputation;  Surgeon: Newt Minion, MD;  Location: [facility];  Service: Orthopedics;  Laterality: Left;   ANTERIOR FUSION CERVICAL SPINE  02/06/06   C4-5, C5-6, C6-7; SURGEON DR. MAX COHEN   AV FISTULA PLACEMENT Left 06/02/2020   Procedure: ARTERIOVENOUS (AV) FISTULA CREATION LEFT;  Surgeon: Waynetta Sandy, MD;  Location: [facility];  Service: Vascular;  Laterality: Left;   BACK SURGERY     x 3   [place] Left 07/21/2020   Procedure: LEFT UPPER ARM ATERIOVENOUS SUPERFISTULALIZATION;  Surgeon: Waynetta Sandy, MD;  Location: [facility];  Service: Vascular;  Laterality: Left;   BELOW KNEE LEG AMPUTATION Right    CARDIAC CATHETERIZATION  10/31/04   2009   CAROTID ENDARTERECTOMY  11/10/10   CAROTID ENDARTERECTOMY Left 11/10/2010   Subtotal occlusion of left internal carotid artery with left hemispheric transient ischemic attacks.   CAROTID STENT     CARPAL TUNNEL RELEASE Right 10/21/2013   Procedure: RIGHT CARPAL TUNNEL RELEASE;  Surgeon: Wynonia Sours, MD;  Location: [place];  Service: Orthopedics;  Laterality: Right;   CHOLECYSTECTOMY     COLON SURGERY     COLONOSCOPY     COLOSTOMY REVERSAL  05/21/2018   ileostomy reversal   CYSTOSCOPY WITH STENT PLACEMENT Bilateral 01/13/2018   Procedure: CYSTOSCOPY WITH BILATERAL URETERAL CATHETER PLACEMENT;  Surgeon: Ardis Hughs, MD;  [facility];  Service: Urology;  Laterality: Bilateral;   ESOPHAGEAL MANOMETRY Bilateral 07/19/2014   Procedure: ESOPHAGEAL MANOMETRY (EM);  Surgeon: Jerene Bears, MD;  [facility];  Service: Gastroenterology;  Laterality: Bilateral;   EYE SURGERY Bilateral 2020   cataract   FEMORAL ARTERY STENT     x6   FINGER SURGERY     FOOT SURGERY  04/25/2016    EXCISION BASE 5TH METATARSAL AND PARTIAL CUBOID LEFT FOOT   HERNIA REPAIR     LEFT INGUINAL AND UMBILICAL REPAIRS   HERNIA REPAIR     I & D EXTREMITY Left 04/25/2016   Procedure: EXCISION BASE 5TH METATARSAL AND PARTIAL CUBOID LEFT FOOT;  Surgeon: Newt Minion, MD;  Location: [facility];  Service: Orthopedics;  Laterality: Left;   ILEOSTOMY  01/13/2018   Procedure: ILEOSTOMY;  Surgeon: Clovis Riley, MD;  Location[facility];  Service: General;;   ILEOSTOMY CLOSURE N/A 05/21/2018   Procedure: ILEOSTOMY REVERSAL ERAS PATHWAY;  Surgeon: Clovis Riley, MD;  Location: [facility];  Service: General;  Laterality: N/A;   IR RADIOLOGIST EVAL & MGMT  11/19/2017   IR RADIOLOGIST EVAL & MGMT  12/03/2017   IR RADIOLOGIST EVAL & MGMT  12/18/2017   JOINT REPLACEMENT Right 2001   Total knee  LAMINECTOMY     X 3 LUMBAR AND X 2 CERVICAL SPINE OPERATIONS   LAPAROSCOPIC CHOLECYSTECTOMY W/ CHOLANGIOGRAPHY  11/09/04   SURGEON DR. Isabel CATH AND CORONARY ANGIOGRAPHY N/A 09/16/2018   Procedure: LEFT HEART CATH AND CORONARY ANGIOGRAPHY;  Surgeon: Nigel Mormon, MD;  Location: [facility];  Service: Cardiovascular;  Laterality: N/A;   LEFT HEART CATHETERIZATION WITH CORONARY ANGIOGRAM N/A 10/29/2014   Procedure:  LEFT HEART CATHETERIZATION WITH CORONARY ANGIOGRAM;  Surgeon: Laverda Page, MD;  Location: [facility];  Service: Cardiovascular;  Laterality: N/A;   LIGATION OF COMPETING BRANCHES OF ARTERIOVENOUS FISTULA Left 07/21/2020   Procedure: LIGATION OF COMPETING BRANCHES OF LEFT UPPER ARM ARTERIOVENOUS FISTULA;  Surgeon: Waynetta Sandy, MD;  Location: [facility];  Service: Vascular;  Laterality: Left;   LOWER EXTREMITY ANGIOGRAM N/A 03/19/2012   Procedure: LOWER EXTREMITY ANGIOGRAM;  Surgeon: Burnell Blanks, MD;  Location: [facility];  Service: Cardiovascular;  Laterality: N/A;   LOWER EXTREMITY ANGIOGRAPHY N/A 06/20/2021   Procedure: LOWER EXTREMITY ANGIOGRAPHY;  Surgeon: Nigel Mormon, MD;  Location: [facility];  Service: Cardiovascular;  Laterality: N/A;   NECK SURGERY     PARTIAL COLECTOMY N/A 01/13/2018   Procedure: LAPAROSCOPIC ASSISTED   SIGMOID COLECTOMY ILEOSTOMY;  Surgeon: Clovis Riley, MD;  [facility];  Service: General;  Laterality: N/A;   PENILE PROSTHESIS IMPLANT  08/14/05   INFRAPUBIC INSERTION OF INFLATABLE PENILE PROSTHESIS; SURGEON DR. Amalia Hailey   PENILE PROSTHESIS IMPLANT     PERCUTANEOUS CORONARY STENT INTERVENTION (PCI-S) Right 10/29/2014   Procedure: PERCUTANEOUS CORONARY STENT INTERVENTION (PCI-S);  Surgeon: Laverda Page, MD;  Location: [facility];  Service: Cardiovascular;  Laterality: Right;   PERIPHERAL VASCULAR INTERVENTION Left 06/23/2021   Procedure: PERIPHERAL VASCULAR INTERVENTION;  Surgeon: Nigel Mormon, MD;  Location: [facility];  Service: Cardiovascular;  Laterality: Left;   REMOVAL OF PENILE PROSTHESIS N/A 06/14/2021   Procedure: Removal of THREE piece inflatable penile prosthesis;  Surgeon: Lucas Mallow, MD;  Location: [facility];  Service: Urology;  Laterality: N/A;   SHOULDER ARTHROSCOPY     SPINE SURGERY     TOE AMPUTATION Left    TONSILLECTOMY     TOTAL KNEE ARTHROPLASTY  07/2002   RIGHT KNEE ;  SURGEON  DR. [name] ALSO HAD ARTHROSCOPIC RIGHT KNEE IN  10/2001   TOTAL KNEE ARTHROPLASTY     ULNAR NERVE TRANSPOSITION Right 10/21/2013   Procedure: RIGHT ELBOW  ULNAR NERVE DECOMPRESSION;  Surgeon: Wynonia Sours, MD;  Location: [facility];  Service: Orthopedics;  Laterality: Right;   Social History   Occupational History   Occupation: Magazine features editor: UNEMPLOYED  Tobacco Use   Smoking status: Former    Packs/day: 2.00    Years: 35.00    Total pack years: 70.00    Types: Cigarettes    Quit date: 10/28/2011    Years since quitting: 10.3   Smokeless tobacco: Never  Vaping Use   Vaping Use: Some days   Substances: Nicotine  Substance and Sexual Activity   Alcohol use: Not Currently    Comment: "not in a long time"   Drug use: Never   Sexual activity: Yes    Birth control/protection: Implant    Comment: penile implant

## 2022-03-01 DIAGNOSIS — N2581 Secondary hyperparathyroidism of renal origin: Secondary | ICD-10-CM | POA: Diagnosis not present

## 2022-03-01 DIAGNOSIS — D509 Iron deficiency anemia, unspecified: Secondary | ICD-10-CM | POA: Diagnosis not present

## 2022-03-01 DIAGNOSIS — M869 Osteomyelitis, unspecified: Secondary | ICD-10-CM | POA: Diagnosis not present

## 2022-03-01 DIAGNOSIS — N186 End stage renal disease: Secondary | ICD-10-CM | POA: Diagnosis not present

## 2022-03-01 DIAGNOSIS — Z992 Dependence on renal dialysis: Secondary | ICD-10-CM | POA: Diagnosis not present

## 2022-03-03 DIAGNOSIS — D509 Iron deficiency anemia, unspecified: Secondary | ICD-10-CM | POA: Diagnosis not present

## 2022-03-03 DIAGNOSIS — M869 Osteomyelitis, unspecified: Secondary | ICD-10-CM | POA: Diagnosis not present

## 2022-03-03 DIAGNOSIS — Z992 Dependence on renal dialysis: Secondary | ICD-10-CM | POA: Diagnosis not present

## 2022-03-03 DIAGNOSIS — N2581 Secondary hyperparathyroidism of renal origin: Secondary | ICD-10-CM | POA: Diagnosis not present

## 2022-03-03 DIAGNOSIS — N186 End stage renal disease: Secondary | ICD-10-CM | POA: Diagnosis not present

## 2022-03-04 ENCOUNTER — Other Ambulatory Visit: Payer: Self-pay | Admitting: Orthopedic Surgery

## 2022-03-06 DIAGNOSIS — N186 End stage renal disease: Secondary | ICD-10-CM | POA: Diagnosis not present

## 2022-03-06 DIAGNOSIS — E875 Hyperkalemia: Secondary | ICD-10-CM | POA: Diagnosis not present

## 2022-03-06 DIAGNOSIS — N2581 Secondary hyperparathyroidism of renal origin: Secondary | ICD-10-CM | POA: Diagnosis not present

## 2022-03-06 DIAGNOSIS — Z992 Dependence on renal dialysis: Secondary | ICD-10-CM | POA: Diagnosis not present

## 2022-03-06 DIAGNOSIS — M869 Osteomyelitis, unspecified: Secondary | ICD-10-CM | POA: Diagnosis not present

## 2022-03-08 ENCOUNTER — Other Ambulatory Visit: Payer: Self-pay | Admitting: Orthopedic Surgery

## 2022-03-08 DIAGNOSIS — E875 Hyperkalemia: Secondary | ICD-10-CM | POA: Diagnosis not present

## 2022-03-08 DIAGNOSIS — N2581 Secondary hyperparathyroidism of renal origin: Secondary | ICD-10-CM | POA: Diagnosis not present

## 2022-03-08 DIAGNOSIS — N186 End stage renal disease: Secondary | ICD-10-CM | POA: Diagnosis not present

## 2022-03-08 DIAGNOSIS — M869 Osteomyelitis, unspecified: Secondary | ICD-10-CM | POA: Diagnosis not present

## 2022-03-08 DIAGNOSIS — Z992 Dependence on renal dialysis: Secondary | ICD-10-CM | POA: Diagnosis not present

## 2022-03-09 ENCOUNTER — Telehealth: Payer: Self-pay | Admitting: Orthopedic Surgery

## 2022-03-09 DIAGNOSIS — N186 End stage renal disease: Secondary | ICD-10-CM | POA: Diagnosis not present

## 2022-03-09 DIAGNOSIS — Z992 Dependence on renal dialysis: Secondary | ICD-10-CM | POA: Diagnosis not present

## 2022-03-09 DIAGNOSIS — E1122 Type 2 diabetes mellitus with diabetic chronic kidney disease: Secondary | ICD-10-CM | POA: Diagnosis not present

## 2022-03-09 NOTE — Telephone Encounter (Signed)
Elzie Rings (PT) from [facility] called to inform [name] pt was discharged form home health pt on 03/07/22. Was unable to gove pt  therapy last 2 visit due to blood pressure being elevated. Grtechen did notify pt's PCP. Pt also unable to bare weight and tranfer on heel. If any questions fell free to call Gretchen at [phone]

## 2022-03-09 NOTE — Telephone Encounter (Addendum)
Received call from [place] with [facility] needing orders for patient. Patient has new stage 2 wd to left glut. Morey Hummingbird advised she need order to clean twice a week,apply protectant barrier cream and cover with foam dressing. The number to contact Morey Hummingbird is [phone]

## 2022-03-10 DIAGNOSIS — S81802D Unspecified open wound, left lower leg, subsequent encounter: Secondary | ICD-10-CM | POA: Diagnosis not present

## 2022-03-10 DIAGNOSIS — M159 Polyosteoarthritis, unspecified: Secondary | ICD-10-CM | POA: Diagnosis not present

## 2022-03-10 DIAGNOSIS — K219 Gastro-esophageal reflux disease without esophagitis: Secondary | ICD-10-CM | POA: Diagnosis not present

## 2022-03-10 DIAGNOSIS — D509 Iron deficiency anemia, unspecified: Secondary | ICD-10-CM | POA: Diagnosis not present

## 2022-03-10 DIAGNOSIS — J439 Emphysema, unspecified: Secondary | ICD-10-CM | POA: Diagnosis not present

## 2022-03-10 DIAGNOSIS — E11622 Type 2 diabetes mellitus with other skin ulcer: Secondary | ICD-10-CM | POA: Diagnosis not present

## 2022-03-10 DIAGNOSIS — Z9989 Dependence on other enabling machines and devices: Secondary | ICD-10-CM | POA: Diagnosis not present

## 2022-03-10 DIAGNOSIS — Z794 Long term (current) use of insulin: Secondary | ICD-10-CM | POA: Diagnosis not present

## 2022-03-10 DIAGNOSIS — Z992 Dependence on renal dialysis: Secondary | ICD-10-CM | POA: Diagnosis not present

## 2022-03-10 DIAGNOSIS — G709 Myoneural disorder, unspecified: Secondary | ICD-10-CM | POA: Diagnosis not present

## 2022-03-10 DIAGNOSIS — D631 Anemia in chronic kidney disease: Secondary | ICD-10-CM | POA: Diagnosis not present

## 2022-03-10 DIAGNOSIS — Z89511 Acquired absence of right leg below knee: Secondary | ICD-10-CM | POA: Diagnosis not present

## 2022-03-10 DIAGNOSIS — K5730 Diverticulosis of large intestine without perforation or abscess without bleeding: Secondary | ICD-10-CM | POA: Diagnosis not present

## 2022-03-10 DIAGNOSIS — K760 Fatty (change of) liver, not elsewhere classified: Secondary | ICD-10-CM | POA: Diagnosis not present

## 2022-03-10 DIAGNOSIS — E785 Hyperlipidemia, unspecified: Secondary | ICD-10-CM | POA: Diagnosis not present

## 2022-03-10 DIAGNOSIS — I132 Hypertensive heart and chronic kidney disease with heart failure and with stage 5 chronic kidney disease, or end stage renal disease: Secondary | ICD-10-CM | POA: Diagnosis not present

## 2022-03-10 DIAGNOSIS — E1122 Type 2 diabetes mellitus with diabetic chronic kidney disease: Secondary | ICD-10-CM | POA: Diagnosis not present

## 2022-03-10 DIAGNOSIS — G4733 Obstructive sleep apnea (adult) (pediatric): Secondary | ICD-10-CM | POA: Diagnosis not present

## 2022-03-10 DIAGNOSIS — I453 Trifascicular block: Secondary | ICD-10-CM | POA: Diagnosis not present

## 2022-03-10 DIAGNOSIS — I5030 Unspecified diastolic (congestive) heart failure: Secondary | ICD-10-CM | POA: Diagnosis not present

## 2022-03-10 DIAGNOSIS — E1151 Type 2 diabetes mellitus with diabetic peripheral angiopathy without gangrene: Secondary | ICD-10-CM | POA: Diagnosis not present

## 2022-03-10 DIAGNOSIS — L97323 Non-pressure chronic ulcer of left ankle with necrosis of muscle: Secondary | ICD-10-CM | POA: Diagnosis not present

## 2022-03-10 DIAGNOSIS — L219 Seborrheic dermatitis, unspecified: Secondary | ICD-10-CM | POA: Diagnosis not present

## 2022-03-10 DIAGNOSIS — Z87891 Personal history of nicotine dependence: Secondary | ICD-10-CM | POA: Diagnosis not present

## 2022-03-10 DIAGNOSIS — N186 End stage renal disease: Secondary | ICD-10-CM | POA: Diagnosis not present

## 2022-03-10 DIAGNOSIS — Z792 Long term (current) use of antibiotics: Secondary | ICD-10-CM | POA: Diagnosis not present

## 2022-03-10 DIAGNOSIS — N2581 Secondary hyperparathyroidism of renal origin: Secondary | ICD-10-CM | POA: Diagnosis not present

## 2022-03-12 NOTE — Telephone Encounter (Signed)
Noted  

## 2022-03-12 NOTE — Telephone Encounter (Signed)
[name] informed of VO approval.

## 2022-03-13 DIAGNOSIS — D509 Iron deficiency anemia, unspecified: Secondary | ICD-10-CM | POA: Diagnosis not present

## 2022-03-13 DIAGNOSIS — Z992 Dependence on renal dialysis: Secondary | ICD-10-CM | POA: Diagnosis not present

## 2022-03-13 DIAGNOSIS — M869 Osteomyelitis, unspecified: Secondary | ICD-10-CM | POA: Diagnosis not present

## 2022-03-13 DIAGNOSIS — N186 End stage renal disease: Secondary | ICD-10-CM | POA: Diagnosis not present

## 2022-03-13 DIAGNOSIS — N2581 Secondary hyperparathyroidism of renal origin: Secondary | ICD-10-CM | POA: Diagnosis not present

## 2022-03-15 DIAGNOSIS — N186 End stage renal disease: Secondary | ICD-10-CM | POA: Diagnosis not present

## 2022-03-15 DIAGNOSIS — Z992 Dependence on renal dialysis: Secondary | ICD-10-CM | POA: Diagnosis not present

## 2022-03-15 DIAGNOSIS — N2581 Secondary hyperparathyroidism of renal origin: Secondary | ICD-10-CM | POA: Diagnosis not present

## 2022-03-15 DIAGNOSIS — D509 Iron deficiency anemia, unspecified: Secondary | ICD-10-CM | POA: Diagnosis not present

## 2022-03-15 DIAGNOSIS — M869 Osteomyelitis, unspecified: Secondary | ICD-10-CM | POA: Diagnosis not present

## 2022-03-17 DIAGNOSIS — M869 Osteomyelitis, unspecified: Secondary | ICD-10-CM | POA: Diagnosis not present

## 2022-03-17 DIAGNOSIS — N186 End stage renal disease: Secondary | ICD-10-CM | POA: Diagnosis not present

## 2022-03-17 DIAGNOSIS — N2581 Secondary hyperparathyroidism of renal origin: Secondary | ICD-10-CM | POA: Diagnosis not present

## 2022-03-17 DIAGNOSIS — Z992 Dependence on renal dialysis: Secondary | ICD-10-CM | POA: Diagnosis not present

## 2022-03-17 DIAGNOSIS — D509 Iron deficiency anemia, unspecified: Secondary | ICD-10-CM | POA: Diagnosis not present

## 2022-03-19 ENCOUNTER — Other Ambulatory Visit: Payer: Self-pay | Admitting: Orthopedic Surgery

## 2022-03-19 DIAGNOSIS — I25118 Atherosclerotic heart disease of native coronary artery with other forms of angina pectoris: Secondary | ICD-10-CM | POA: Diagnosis not present

## 2022-03-19 DIAGNOSIS — I4891 Unspecified atrial fibrillation: Secondary | ICD-10-CM | POA: Diagnosis not present

## 2022-03-19 DIAGNOSIS — N186 End stage renal disease: Secondary | ICD-10-CM | POA: Diagnosis not present

## 2022-03-19 DIAGNOSIS — K209 Esophagitis, unspecified without bleeding: Secondary | ICD-10-CM | POA: Diagnosis not present

## 2022-03-19 DIAGNOSIS — E1140 Type 2 diabetes mellitus with diabetic neuropathy, unspecified: Secondary | ICD-10-CM | POA: Diagnosis not present

## 2022-03-19 DIAGNOSIS — I10 Essential (primary) hypertension: Secondary | ICD-10-CM | POA: Diagnosis not present

## 2022-03-19 DIAGNOSIS — Z794 Long term (current) use of insulin: Secondary | ICD-10-CM | POA: Diagnosis not present

## 2022-03-19 DIAGNOSIS — S98312S Complete traumatic amputation of left midfoot, sequela: Secondary | ICD-10-CM | POA: Diagnosis not present

## 2022-03-19 DIAGNOSIS — K2270 Barrett's esophagus without dysplasia: Secondary | ICD-10-CM | POA: Diagnosis not present

## 2022-03-19 DIAGNOSIS — Z89511 Acquired absence of right leg below knee: Secondary | ICD-10-CM | POA: Diagnosis not present

## 2022-03-19 DIAGNOSIS — G2581 Restless legs syndrome: Secondary | ICD-10-CM | POA: Diagnosis not present

## 2022-03-19 DIAGNOSIS — G894 Chronic pain syndrome: Secondary | ICD-10-CM | POA: Diagnosis not present

## 2022-03-20 DIAGNOSIS — Z992 Dependence on renal dialysis: Secondary | ICD-10-CM | POA: Diagnosis not present

## 2022-03-20 DIAGNOSIS — E1122 Type 2 diabetes mellitus with diabetic chronic kidney disease: Secondary | ICD-10-CM | POA: Diagnosis not present

## 2022-03-20 DIAGNOSIS — N186 End stage renal disease: Secondary | ICD-10-CM | POA: Diagnosis not present

## 2022-03-20 DIAGNOSIS — D631 Anemia in chronic kidney disease: Secondary | ICD-10-CM | POA: Diagnosis not present

## 2022-03-20 DIAGNOSIS — D509 Iron deficiency anemia, unspecified: Secondary | ICD-10-CM | POA: Diagnosis not present

## 2022-03-20 DIAGNOSIS — N2581 Secondary hyperparathyroidism of renal origin: Secondary | ICD-10-CM | POA: Diagnosis not present

## 2022-03-22 DIAGNOSIS — Z992 Dependence on renal dialysis: Secondary | ICD-10-CM | POA: Diagnosis not present

## 2022-03-22 DIAGNOSIS — D509 Iron deficiency anemia, unspecified: Secondary | ICD-10-CM | POA: Diagnosis not present

## 2022-03-22 DIAGNOSIS — N2581 Secondary hyperparathyroidism of renal origin: Secondary | ICD-10-CM | POA: Diagnosis not present

## 2022-03-22 DIAGNOSIS — E1122 Type 2 diabetes mellitus with diabetic chronic kidney disease: Secondary | ICD-10-CM | POA: Diagnosis not present

## 2022-03-22 DIAGNOSIS — N186 End stage renal disease: Secondary | ICD-10-CM | POA: Diagnosis not present

## 2022-03-22 DIAGNOSIS — D631 Anemia in chronic kidney disease: Secondary | ICD-10-CM | POA: Diagnosis not present

## 2022-03-24 DIAGNOSIS — Z992 Dependence on renal dialysis: Secondary | ICD-10-CM | POA: Diagnosis not present

## 2022-03-24 DIAGNOSIS — N2581 Secondary hyperparathyroidism of renal origin: Secondary | ICD-10-CM | POA: Diagnosis not present

## 2022-03-24 DIAGNOSIS — N186 End stage renal disease: Secondary | ICD-10-CM | POA: Diagnosis not present

## 2022-03-24 DIAGNOSIS — D509 Iron deficiency anemia, unspecified: Secondary | ICD-10-CM | POA: Diagnosis not present

## 2022-03-24 DIAGNOSIS — D631 Anemia in chronic kidney disease: Secondary | ICD-10-CM | POA: Diagnosis not present

## 2022-03-24 DIAGNOSIS — E1122 Type 2 diabetes mellitus with diabetic chronic kidney disease: Secondary | ICD-10-CM | POA: Diagnosis not present

## 2022-03-26 ENCOUNTER — Ambulatory Visit (INDEPENDENT_AMBULATORY_CARE_PROVIDER_SITE_OTHER): Payer: Medicare HMO | Admitting: Orthopedic Surgery

## 2022-03-26 DIAGNOSIS — L97521 Non-pressure chronic ulcer of other part of left foot limited to breakdown of skin: Secondary | ICD-10-CM

## 2022-03-26 DIAGNOSIS — I87332 Chronic venous hypertension (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: Secondary | ICD-10-CM | POA: Diagnosis not present

## 2022-03-27 DIAGNOSIS — N186 End stage renal disease: Secondary | ICD-10-CM | POA: Diagnosis not present

## 2022-03-27 DIAGNOSIS — N2581 Secondary hyperparathyroidism of renal origin: Secondary | ICD-10-CM | POA: Diagnosis not present

## 2022-03-27 DIAGNOSIS — Z992 Dependence on renal dialysis: Secondary | ICD-10-CM | POA: Diagnosis not present

## 2022-03-27 DIAGNOSIS — D509 Iron deficiency anemia, unspecified: Secondary | ICD-10-CM | POA: Diagnosis not present

## 2022-03-29 DIAGNOSIS — N186 End stage renal disease: Secondary | ICD-10-CM | POA: Diagnosis not present

## 2022-03-29 DIAGNOSIS — Z992 Dependence on renal dialysis: Secondary | ICD-10-CM | POA: Diagnosis not present

## 2022-03-29 DIAGNOSIS — N2581 Secondary hyperparathyroidism of renal origin: Secondary | ICD-10-CM | POA: Diagnosis not present

## 2022-03-29 DIAGNOSIS — D509 Iron deficiency anemia, unspecified: Secondary | ICD-10-CM | POA: Diagnosis not present

## 2022-03-30 ENCOUNTER — Other Ambulatory Visit: Payer: Self-pay | Admitting: Orthopedic Surgery

## 2022-03-30 ENCOUNTER — Telehealth: Payer: Self-pay | Admitting: Student

## 2022-03-30 NOTE — Telephone Encounter (Signed)
Returned call to patient. Refill being sent to pharmacy. Next appointment scheduled for 04/18/22 at 3pm.

## 2022-03-31 DIAGNOSIS — D509 Iron deficiency anemia, unspecified: Secondary | ICD-10-CM | POA: Diagnosis not present

## 2022-03-31 DIAGNOSIS — N186 End stage renal disease: Secondary | ICD-10-CM | POA: Diagnosis not present

## 2022-03-31 DIAGNOSIS — Z992 Dependence on renal dialysis: Secondary | ICD-10-CM | POA: Diagnosis not present

## 2022-03-31 DIAGNOSIS — N2581 Secondary hyperparathyroidism of renal origin: Secondary | ICD-10-CM | POA: Diagnosis not present

## 2022-04-02 ENCOUNTER — Encounter (INDEPENDENT_AMBULATORY_CARE_PROVIDER_SITE_OTHER): Payer: Self-pay | Admitting: Ophthalmology

## 2022-04-02 ENCOUNTER — Ambulatory Visit (INDEPENDENT_AMBULATORY_CARE_PROVIDER_SITE_OTHER): Payer: Medicare HMO | Admitting: Ophthalmology

## 2022-04-02 DIAGNOSIS — E113511 Type 2 diabetes mellitus with proliferative diabetic retinopathy with macular edema, right eye: Secondary | ICD-10-CM

## 2022-04-02 DIAGNOSIS — H4311 Vitreous hemorrhage, right eye: Secondary | ICD-10-CM | POA: Diagnosis not present

## 2022-04-02 NOTE — Assessment & Plan Note (Signed)
OD improvement overall with less CSME.  Yet still with vitreous hemorrhage

## 2022-04-02 NOTE — Progress Notes (Addendum)
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 male who presents to the clinic today for:   HPI   5 weeks dilate OD EYLEA OCT Pt states her vision has been stable Pt admits to new floaters in the right eye  Last edited by Morene Rankins, CMA on 04/02/2022  2:21 PM.      Referring physician: [name], PA-C [address] [place],  [place] [address]  HISTORICAL INFORMATION:   Selected notes from the MEDICAL RECORD NUMBER    Lab Results  Component Value Date   HGBA1C 7.6 (H) 01/11/2022     CURRENT MEDICATIONS: Current Outpatient Medications (Ophthalmic Drugs)  Medication Sig   Polyvinyl Alcohol-Povidone PF 1.4-0.6 % SOLN Place 1 drop into both eyes 2 (two) times daily as needed (dry eyes).    No current facility-administered medications for this visit. (Ophthalmic Drugs)   Current Outpatient Medications (Other)  Medication Sig   amiodarone (PACERONE) 200 MG tablet TAKE 2 TABLETS DAILY FOR 10 DAYS THEN 1 TABLET DAILY (Patient taking differently: Take 200 mg by mouth daily.)   amLODipine (NORVASC) 5 MG tablet Take 5 mg by mouth daily.   apixaban (ELIQUIS) 5 MG TABS tablet Take 1 tablet (5 mg total) by mouth 2 (two) times daily.   atorvastatin (LIPITOR) 40 MG tablet Take 40 mg by mouth at bedtime.    B Complex-C-Zn-Folic Acid (DIALYVITE/ZINC) TABS Take 1 tablet by mouth daily.   doxycycline (VIBRA-TABS) 100 MG tablet TAKE 1 TABLET BY MOUTH TWICE A DAY   fentaNYL (DURAGESIC) 50 MCG/HR Place 1 patch onto the skin every 3 (three) days.   folic acid (FOLVITE) 1 MG tablet Take 1 mg by mouth daily.   Glucosamine HCl (GLUCOSAMINE PO) Take 1 tablet by mouth 2 (two) times daily.   HUMALOG KWIKPEN 100 UNIT/ML KwikPen Inject 4 Units into the skin 3 (three) times daily. (Patient taking differently: Inject 13 Units into  the skin 3 (three) times daily.)   insulin glargine (LANTUS) 100 UNIT/ML injection Inject 0.1 mLs (10 Units total) into the skin at bedtime. (Patient taking differently: Inject 30 Units into the skin at bedtime.)   ketoconazole (NIZORAL) 2 % cream Apply 1 application topically 2 (two) times daily as needed for irritation.    lidocaine-prilocaine (EMLA) cream Apply 1 application topically See admin instructions. Prior to Dialysis days Tuesday,Thursday and saturday   linaclotide (LINZESS) 145 MCG CAPS capsule Take 145 mcg by mouth daily as needed (constipation).    loratadine (CLARITIN) 10 MG tablet Take 10 mg by mouth daily.   metoCLOPramide (REGLAN) 5 MG tablet Take 1 tablet (5 mg total) by mouth 4 (four) times daily -  before meals and at bedtime. (Patient taking differently: Take 5 mg by mouth 3 (three) times daily before meals.)   midodrine (PROAMATINE) 10 MG tablet Take 1 tablet (10 mg total) by mouth every other day. One hour before dialysis   NEEDLE, REUSABLE, 22 G 22G X 1-1/2" MISC 1 Units by Does not apply route as directed. For B12 IM inj   nitroGLYCERIN (NITRODUR - DOSED IN MG/24 HR) 0.2 mg/hr patch Place 1 patch (0.2 mg total) onto the skin daily.   nitroGLYCERIN (NITROSTAT) 0.4 MG SL tablet Place 0.4 mg under the tongue every 5 (five) minutes as needed for chest  pain.   omega-3 acid ethyl esters (LOVAZA) 1 g capsule Take 2 g by mouth 2 (two) times daily.   ondansetron (ZOFRAN) 4 MG tablet Take 4 mg by mouth every 6 (six) hours as needed for nausea or vomiting.   oxyCODONE-acetaminophen (PERCOCET) 7.5-325 MG tablet Take 1 tablet by mouth 3 (three) times daily as needed for moderate pain. (Patient taking differently: Take 1 tablet by mouth every 8 (eight) hours.)   pantoprazole (PROTONIX) 40 MG tablet Take 1 tablet (40 mg total) by mouth 2 (two) times daily for 30 days, THEN 1 tablet (40 mg total) daily.   pregabalin (LYRICA) 100 MG capsule Take 100 mg by mouth 2 (two) times daily.    rOPINIRole (REQUIP) 2 MG tablet Take 2 mg by mouth at bedtime.   sevelamer carbonate (RENVELA) 800 MG tablet Take 800 mg by mouth 3 (three) times daily.   silver sulfADIAZINE (SILVADENE) 1 % cream Apply topically daily.   Syringe/Needle, Disp, (SYRINGE 3CC/22GX1-1/2") 22G X 1-1/2" 3 ML MISC 1 Syringe by Does not apply route as directed. For b12 IM inj   tamsulosin (FLOMAX) 0.4 MG CAPS capsule Take 0.4 mg by mouth daily.   No current facility-administered medications for this visit. (Other)      REVIEW OF SYSTEMS: ROS   Negative for: Constitutional, Gastrointestinal, Neurological, Skin, Genitourinary, Musculoskeletal, HENT, Endocrine, Cardiovascular, Eyes, Respiratory, Psychiatric, Allergic/Imm, Heme/Lymph Last edited by Morene Rankins, CMA on 04/02/2022  2:21 PM.       ALLERGIES Allergies  Allergen Reactions   Contrast Media [Iodinated Contrast Media] Shortness Of Breath and Other (See Comments)    Difficulty breathing and altered mental status     Ivp Dye [Iodinated Contrast Media] Anaphylaxis, Shortness Of Breath and Other (See Comments)    Breathing problems, altered mental state    Adhesive [Tape] Rash and Other (See Comments)    Rash after 1 day of use   Latex Rash and Other (See Comments)    A severe rash appears after the first 24 hours of being placed    PAST MEDICAL HISTORY Past Medical History:  Diagnosis Date   Carotid artery occlusion 11/10/10   LEFT CAROTID ENDARTERECTOMY   Chronic kidney disease    Complication of anesthesia    BP WENT UP AT [facility] "   COPD (chronic obstructive pulmonary disease) ([place])    pt denies this dx as of 06/01/20 - no inhaler    Diabetes mellitus without complication (HCC)    Diverticulitis    Diverticulosis of colon (without mention of hemorrhage)    DJD (degenerative joint disease)    knees/hands/feet/back/neck   Fatty liver    Full dentures    GERD (gastroesophageal reflux disease)    H/O hiatal hernia    History of blood  transfusion    with a past surical procedure per patient 06/01/20   Hyperlipidemia    Hypertension    Neuromuscular disorder ([place])    peripheral neuropathy   Non-pressure chronic ulcer of other part of left foot limited to breakdown of skin ([place]) 11/12/2016   Osteomyelitis (HCC)    left 5th metatarsal   PAD (peripheral artery disease) ([place])    Distal aortogram June 2012. Atherectomy left popliteal artery July 2012.    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   Unstable angina ([place]) 09/16/2018  Wears glasses    Past Surgical History:  Procedure Laterality Date   ABDOMINAL AORTOGRAM W/LOWER EXTREMITY N/A 06/23/2021   Procedure: ABDOMINAL AORTOGRAM W/LOWER EXTREMITY;  Surgeon: Nigel Mormon, MD;  Location: [facility];  Service: Cardiovascular;  Laterality: N/A;   AMPUTATION  11/05/2011   Procedure: AMPUTATION RAY;  Surgeon: Wylene Simmer, MD;  Location: [facility];  Service: Orthopedics;  Laterality: Right;  Amputation of Right 4&5th Toes   AMPUTATION Left 11/26/2012   Procedure: AMPUTATION RAY;  Surgeon: Wylene Simmer, MD;  Location: [facility];  Service: Orthopedics;  Laterality: Left;  fourth ray amputation   AMPUTATION Right 08/27/2014   Procedure: Transmetatarsal Amputation;  Surgeon: Newt Minion, MD;  Location: [facility];  Service: Orthopedics;  Laterality: Right;   AMPUTATION Right 01/14/2015   Procedure: AMPUTATION BELOW KNEE;  Surgeon: Newt Minion, MD;  Location: [place];  Service: Orthopedics;  Laterality: Right;   AMPUTATION Left 10/21/2015   Procedure: Left Foot 5th Ray Amputation;  Surgeon: Newt Minion, MD;  Location: [facility];  Service: Orthopedics;  Laterality: Left;   ANTERIOR FUSION CERVICAL SPINE  02/06/06   C4-5, C5-6, C6-7; SURGEON DR. MAX COHEN   AV FISTULA PLACEMENT Left 06/02/2020   Procedure: ARTERIOVENOUS (AV) FISTULA CREATION LEFT;  Surgeon: Waynetta Sandy, MD;  Location: [facility];  Service: Vascular;  Laterality: Left;   BACK SURGERY     x 3   [place] Left 07/21/2020   Procedure: LEFT UPPER ARM ATERIOVENOUS SUPERFISTULALIZATION;  Surgeon: Waynetta Sandy, MD;  Location: [facility];  Service: Vascular;  Laterality: Left;   BELOW KNEE LEG AMPUTATION Right    CARDIAC CATHETERIZATION  10/31/04   2009   CAROTID ENDARTERECTOMY  11/10/10   CAROTID ENDARTERECTOMY Left 11/10/2010   Subtotal occlusion of left internal carotid artery with left hemispheric transient ischemic attacks.   CAROTID STENT     CARPAL TUNNEL RELEASE Right 10/21/2013   Procedure: RIGHT CARPAL TUNNEL RELEASE;  Surgeon: Wynonia Sours, MD;  Location: [facility];  Service: Orthopedics;  Laterality: Right;   CHOLECYSTECTOMY     COLON SURGERY     COLONOSCOPY     COLOSTOMY REVERSAL  05/21/2018   ileostomy reversal   CYSTOSCOPY WITH STENT PLACEMENT Bilateral 01/13/2018   Procedure: CYSTOSCOPY WITH BILATERAL URETERAL CATHETER PLACEMENT;  Surgeon: Ardis Hughs, MD;  Location: [facility];  Service: Urology;  Laterality: Bilateral;   ESOPHAGEAL MANOMETRY Bilateral 07/19/2014   Procedure: ESOPHAGEAL MANOMETRY (EM);  Surgeon: Jerene Bears, MD;  [facility];  Service: Gastroenterology;  Laterality: Bilateral;   EYE SURGERY Bilateral 2020   cataract   FEMORAL ARTERY STENT     x6   FINGER SURGERY     FOOT SURGERY  04/25/2016    EXCISION BASE 5TH METATARSAL AND PARTIAL CUBOID LEFT FOOT   HERNIA REPAIR     LEFT INGUINAL AND UMBILICAL REPAIRS   HERNIA REPAIR     I & D EXTREMITY Left 04/25/2016   Procedure: EXCISION BASE 5TH METATARSAL AND PARTIAL CUBOID LEFT FOOT;  Surgeon: Newt Minion, MD;  Location: [facility];  Service: Orthopedics;  Laterality: Left;   ILEOSTOMY  01/13/2018   Procedure: ILEOSTOMY;  Surgeon: Clovis Riley, MD;  Location: [facility];  Service: General;;   ILEOSTOMY CLOSURE N/A 05/21/2018   Procedure: ILEOSTOMY  REVERSAL ERAS PATHWAY;  Surgeon: Clovis Riley, MD;  Location: [facility];  Service: General;  Laterality: N/A;   IR RADIOLOGIST  EVAL & MGMT  11/19/2017   IR RADIOLOGIST EVAL & MGMT  12/03/2017   IR RADIOLOGIST EVAL & MGMT  12/18/2017   JOINT REPLACEMENT Right 2001   Total knee   LAMINECTOMY     X 3 LUMBAR AND X 2 CERVICAL SPINE OPERATIONS   LAPAROSCOPIC CHOLECYSTECTOMY W/ CHOLANGIOGRAPHY  11/09/04   SURGEON DR. [place] CATH AND CORONARY ANGIOGRAPHY N/A 09/16/2018   Procedure: LEFT HEART CATH AND CORONARY ANGIOGRAPHY;  Surgeon: Nigel Mormon, MD;  Location: [facility] [facility];  Service: Cardiovascular;  Laterality: N/A;   LEFT HEART CATHETERIZATION WITH CORONARY ANGIOGRAM N/A 10/29/2014   Procedure: LEFT HEART CATHETERIZATION WITH CORONARY ANGIOGRAM;  Surgeon: Laverda Page, MD;  Location[facility];  Service: Cardiovascular;  Laterality: N/A;   LIGATION OF COMPETING BRANCHES OF ARTERIOVENOUS FISTULA Left 07/21/2020   Procedure: LIGATION OF COMPETING BRANCHES OF LEFT UPPER ARM ARTERIOVENOUS FISTULA;  Surgeon: Waynetta Sandy, MD;  Location: [facility];  Service: Vascular;  Laterality: Left;   LOWER EXTREMITY ANGIOGRAM N/A 03/19/2012   Procedure: LOWER EXTREMITY ANGIOGRAM;  Surgeon: Burnell Blanks, MD;  [facility];  Service: Cardiovascular;  Laterality: N/A;   LOWER EXTREMITY ANGIOGRAPHY N/A 06/20/2021   Procedure: LOWER EXTREMITY ANGIOGRAPHY;  Surgeon: Nigel Mormon, MD;  Location: [facility];  Service: Cardiovascular;  Laterality: N/A;   NECK SURGERY     PARTIAL COLECTOMY N/A 01/13/2018   Procedure: LAPAROSCOPIC ASSISTED   SIGMOID COLECTOMY ILEOSTOMY;  Surgeon: Clovis Riley, MD;  [facility];  Service: General;  Laterality: N/A;   PENILE PROSTHESIS IMPLANT  08/14/05   INFRAPUBIC INSERTION OF INFLATABLE PENILE PROSTHESIS; SURGEON DR. Amalia Hailey   PENILE PROSTHESIS IMPLANT     PERCUTANEOUS CORONARY STENT INTERVENTION (PCI-S)  Right 10/29/2014   Procedure: PERCUTANEOUS CORONARY STENT INTERVENTION (PCI-S);  Surgeon: Laverda Page, MD;  [facility];  Service: Cardiovascular;  Laterality: Right;   PERIPHERAL VASCULAR INTERVENTION Left 06/23/2021   Procedure: PERIPHERAL VASCULAR INTERVENTION;  Surgeon: Nigel Mormon, MD;  Location: [facility];  Service: Cardiovascular;  Laterality: Left;   REMOVAL OF PENILE PROSTHESIS N/A 06/14/2021   Procedure: Removal of THREE piece inflatable penile prosthesis;  Surgeon: Lucas Mallow, MD;  Location: [place];  Service: Urology;  Laterality: N/A;   SHOULDER ARTHROSCOPY     SPINE SURGERY     TOE AMPUTATION Left    TONSILLECTOMY     TOTAL KNEE ARTHROPLASTY  07/2002   RIGHT KNEE ; SURGEON  DR. [name] ALSO HAD ARTHROSCOPIC RIGHT KNEE IN  10/2001   TOTAL KNEE ARTHROPLASTY     ULNAR NERVE TRANSPOSITION Right 10/21/2013   Procedure: RIGHT ELBOW  ULNAR NERVE DECOMPRESSION;  Surgeon: Wynonia Sours, MD;  Location: [facility];  Service: Orthopedics;  Laterality: Right;    FAMILY HISTORY Family History  Problem Relation Age of Onset   Heart disease Father        Before age 62-  CAD, BPG   Diabetes Father        Amputation   Cancer Father        PROSTATE   Hyperlipidemia Father    Hypertension Father    Heart attack Father        Triple BPG   Varicose Veins Father    Cancer Sister        Breast   Hyperlipidemia Sister    Hypertension Sister    Heart  attack Brother    Colon cancer Brother    Diabetes Brother    Heart disease Brother 32       A-Fib. Before age 97   Hyperlipidemia Brother    Hypertension Brother    Hypertension Son    Arthritis Other        GRANDMOTHER   Hypertension Other        OTHER FAMILY MEMBERS    SOCIAL HISTORY Social History   Tobacco Use   Smoking status: Former    Packs/day: 2.00    Years: 35.00    Total pack years: 70.00    Types: Cigarettes    Quit date: 10/28/2011    Years since quitting: 10.4    Smokeless tobacco: Never  Vaping Use   Vaping Use: Some days   Substances: Nicotine  Substance Use Topics   Alcohol use: Not Currently    Comment: "not in a long time"   Drug use: Never         OPHTHALMIC EXAM:  Base Eye Exam     Visual Acuity (ETDRS)       Right Left   Dist [place] 20/200 20/20         Tonometry (Tonopen, 2:26 PM)       Right Left   Pressure 13 15         Pupils       Pupils   Right PERRL   Left PERRL         Extraocular Movement       Right Left    Full, Ortho Full, Ortho         Neuro/Psych     Oriented x3: Yes   Mood/Affect: Normal         Dilation     Right eye: 2.5% Phenylephrine, 1.0% Mydriacyl @ 2:22 PM           Slit Lamp and Fundus Exam     External Exam       Right Left   External Normal Normal         Slit Lamp Exam       Right Left   Lids/Lashes Normal Normal   Conjunctiva/Sclera White and quiet White and quiet   Cornea Clear Clear   Anterior Chamber Deep and quiet Deep and quiet   Iris Round and reactive Round and reactive   Lens Posterior chamber intraocular lens Posterior chamber intraocular lens   Anterior Vitreous Normal Normal         Fundus Exam       Right Left   Posterior Vitreous Finely dispersed hemorrhage.    Disc Normal    C/D Ratio 0.4    Macula Exudates, Microaneurysms, no apparent CSME    Vessels NPDR severe, with numerous dot blot hemorrhages, VCABS, Irma.  Some evidence of incomplete central retinal vein occlusion    Periphery good PRP nearly 360 yet large dot blot hemorrhages are noted temporally with some room anteriorly lesion             IMAGING AND PROCEDURES  Imaging and Procedures for 04/03/22  OCT, Retina - OU - Both Eyes       Right Eye Quality was good. Scan locations included subfoveal. Central Foveal Thickness: 217. Progression has improved. Findings include abnormal foveal contour, cystoid macular edema.   Left Eye Quality was good. Scan  locations included subfoveal. Central Foveal Thickness: 292. Progression has been stable. Findings include normal foveal contour.   Notes OD  now improved view through the vitreous hemorrhage as it is clearing post injection antivegF.  Will need repeat injection antivegF to clear the residual CSME OD, with some intrinsic loss of foveal macular architecture suggestive of retinal vascular occlusion and/or ischemic CRVO in the past.  Overall improved however  OD, with much less subretinal fluid and intrinsic CME CSME as compared to April 2023 on antivegF medication repeat again today   OS normal     Intravitreal Injection, Pharmacologic Agent - OD - Right Eye       Time Out 04/02/2022. 2:35 PM. Confirmed correct patient, procedure, site, and patient consented.   Anesthesia Topical anesthesia was used. Anesthetic medications included Lidocaine 4%.   Procedure Preparation included 5% betadine to ocular surface, 10% betadine to eyelids, Ofloxacin . A 30 gauge needle was used.   Injection: 2 mg aflibercept 2 MG/0.05ML   Route: Intravitreal, Site: Right Eye   NDC: [id number], Lot: [id number], Expiration date: 05/13/2023, Waste: 0 mL   Post-op Post injection exam found visual acuity of at least counting fingers. The patient tolerated the procedure well. There were no complications. The patient received written and verbal post procedure care education. Post injection medications included ocuflox.              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  Yet still with vitreous hemorrhage  Vitreous hemorrhage of right eye (HCC) Slowly clearing OD.  On intravitreal Eylea.      ICD-10-CM   1. 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 Vitreous hemorrhage of right eye (HCC)  H43.11       1.  OD post recent PRP, June 2023 yet still with clearing vitreous hemorrhage and stabilizing CSME.  Repeat injection antivegF today reevaluate next in 5 weeks  2.  Injection Eylea repeated today atraumatically the right eye  3.  Ophthalmic Meds Ordered this visit:  Meds ordered this encounter  Medications   aflibercept (EYLEA) SOLN 2 mg       Return in about 5 weeks (around 05/07/2022) for dilate, OD, EYLEA OCT.  There are no Patient Instructions on file for this visit.   Explained the diagnoses, plan, and follow up with the patient and they expressed understanding.  Patient expressed understanding of the importance of proper follow up care.   Clent Demark Lajoyce Tamura M.D. Diseases & Surgery of the Retina and Vitreous Retina & Diabetic [place] 04/03/22     Abbreviations: M myopia (nearsighted); A astigmatism; H hyperopia (farsighted); P presbyopia; Mrx spectacle prescription;  CTL contact lenses; OD right eye; OS left eye; OU both eyes  XT exotropia; ET esotropia; PEK punctate epithelial keratitis; PEE punctate epithelial erosions; DES dry eye syndrome; MGD meibomian gland dysfunction; ATs artificial tears; PFAT's preservative free artificial tears; [place] nuclear sclerotic cataract; PSC posterior subcapsular cataract; ERM epi-retinal membrane; PVD posterior vitreous detachment; RD retinal detachment; DM diabetes mellitus; DR diabetic retinopathy; NPDR non-proliferative diabetic retinopathy; PDR proliferative diabetic retinopathy; CSME clinically significant macular edema; DME diabetic macular edema; dbh dot blot hemorrhages; CWS cotton wool spot; POAG primary open angle glaucoma; C/D cup-to-disc ratio; HVF humphrey visual field; GVF goldmann visual field; OCT  optical coherence tomography; IOP intraocular pressure; BRVO Branch retinal vein  occlusion; CRVO central retinal vein occlusion; CRAO central retinal artery occlusion; BRAO branch retinal artery occlusion; RT retinal tear; SB scleral buckle; PPV pars plana vitrectomy; VH Vitreous hemorrhage; PRP panretinal laser photocoagulation; IVK intravitreal kenalog; VMT vitreomacular traction; MH Macular hole;  NVD neovascularization of the disc; NVE neovascularization elsewhere; AREDS age related eye disease study; ARMD age related macular degeneration; POAG primary open angle glaucoma; EBMD epithelial/anterior basement membrane dystrophy; ACIOL anterior chamber intraocular lens; IOL intraocular lens; PCIOL posterior chamber intraocular lens; Phaco/IOL phacoemulsification with intraocular lens placement; [place] photorefractive keratectomy; LASIK laser assisted in situ keratomileusis; HTN hypertension; DM diabetes mellitus; COPD chronic obstructive pulmonary disease

## 2022-04-02 NOTE — Assessment & Plan Note (Signed)
Slowly clearing OD.  On intravitreal Eylea.

## 2022-04-02 NOTE — Assessment & Plan Note (Signed)
>>  ASSESSMENT AND PLAN FOR VITREOUS HEMORRHAGE OF RIGHT EYE (HCC) WRITTEN ON 04/02/2022  2:57 PM BY ELNER ARLEY LABOR, MD  Slowly clearing OD.  On intravitreal Eylea .

## 2022-04-03 DIAGNOSIS — Z992 Dependence on renal dialysis: Secondary | ICD-10-CM | POA: Diagnosis not present

## 2022-04-03 DIAGNOSIS — N2581 Secondary hyperparathyroidism of renal origin: Secondary | ICD-10-CM | POA: Diagnosis not present

## 2022-04-03 DIAGNOSIS — E113511 Type 2 diabetes mellitus with proliferative diabetic retinopathy with macular edema, right eye: Secondary | ICD-10-CM | POA: Diagnosis not present

## 2022-04-03 DIAGNOSIS — H4311 Vitreous hemorrhage, right eye: Secondary | ICD-10-CM | POA: Diagnosis not present

## 2022-04-03 DIAGNOSIS — N186 End stage renal disease: Secondary | ICD-10-CM | POA: Diagnosis not present

## 2022-04-03 DIAGNOSIS — E875 Hyperkalemia: Secondary | ICD-10-CM | POA: Diagnosis not present

## 2022-04-03 DIAGNOSIS — D509 Iron deficiency anemia, unspecified: Secondary | ICD-10-CM | POA: Diagnosis not present

## 2022-04-03 MED ORDER — AFLIBERCEPT 2MG/0.05ML IZ SOLN FOR KALEIDOSCOPE
2.0000 mg | INTRAVITREAL | Status: AC | PRN
  Administered 2022-04-03: 2 mg via INTRAVITREAL

## 2022-04-03 NOTE — Addendum Note (Signed)
Addended by: Deloria Lair A on: 04/03/2022 08:27 AM   Modules accepted: Orders

## 2022-04-05 DIAGNOSIS — E875 Hyperkalemia: Secondary | ICD-10-CM | POA: Diagnosis not present

## 2022-04-05 DIAGNOSIS — D509 Iron deficiency anemia, unspecified: Secondary | ICD-10-CM | POA: Diagnosis not present

## 2022-04-05 DIAGNOSIS — N2581 Secondary hyperparathyroidism of renal origin: Secondary | ICD-10-CM | POA: Diagnosis not present

## 2022-04-05 DIAGNOSIS — N186 End stage renal disease: Secondary | ICD-10-CM | POA: Diagnosis not present

## 2022-04-05 DIAGNOSIS — Z992 Dependence on renal dialysis: Secondary | ICD-10-CM | POA: Diagnosis not present

## 2022-04-07 DIAGNOSIS — Z992 Dependence on renal dialysis: Secondary | ICD-10-CM | POA: Diagnosis not present

## 2022-04-07 DIAGNOSIS — D509 Iron deficiency anemia, unspecified: Secondary | ICD-10-CM | POA: Diagnosis not present

## 2022-04-07 DIAGNOSIS — N2581 Secondary hyperparathyroidism of renal origin: Secondary | ICD-10-CM | POA: Diagnosis not present

## 2022-04-07 DIAGNOSIS — E875 Hyperkalemia: Secondary | ICD-10-CM | POA: Diagnosis not present

## 2022-04-07 DIAGNOSIS — N186 End stage renal disease: Secondary | ICD-10-CM | POA: Diagnosis not present

## 2022-04-09 ENCOUNTER — Encounter: Payer: Self-pay | Admitting: Orthopedic Surgery

## 2022-04-09 DIAGNOSIS — Z992 Dependence on renal dialysis: Secondary | ICD-10-CM | POA: Diagnosis not present

## 2022-04-09 DIAGNOSIS — E1122 Type 2 diabetes mellitus with diabetic chronic kidney disease: Secondary | ICD-10-CM | POA: Diagnosis not present

## 2022-04-09 DIAGNOSIS — N186 End stage renal disease: Secondary | ICD-10-CM | POA: Diagnosis not present

## 2022-04-09 NOTE — Progress Notes (Signed)
Office Visit Note   Patient: [name]           Date of Birth: 19-Jan-1956           MRN: [id number] Visit Date: 03/26/2022              Requested by: [name], PA-C [address] [place],  [place] [address] PCP: [name], PA-C  Chief Complaint  Patient presents with   Left Foot - Wound Check, Follow-up      HPI: Patient is a 66 year old gentleman who presents in follow-up for ischemic ulcer lateral left foot he has been in multilayer compression by home health change twice a week.  Assessment & Plan: Visit Diagnoses:  1. Chronic venous hypertension (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 (HCC)   2. Ischemic ulcer of left foot, limited to breakdown of skin ([place])     Plan: Patient continues to show excellent improvement in the lateral foot ulcer.  Continue with home health dressing changes twice a week continue the nitroglycerin patch.  Follow-Up Instructions: Return in about 3 weeks (around 04/16/2022).   Ortho Exam  Patient is alert, oriented, no adenopathy, well-dressed, normal affect, normal respiratory effort. Examination the lateral foot ulcer on the left is 2 cm in diameter there is no odor no drainage no cellulitis the ulcer is superficial this does not probe to bone or tendon there is no drainage or cellulitis there is decreased swelling.  Imaging: No results found. No images are attached to the encounter.  Labs: Lab Results  Component Value Date   HGBA1C 7.6 (H) 01/11/2022   HGBA1C 7.9 (H) 06/14/2021   HGBA1C 5.7 (H) 09/16/2018   ESRSEDRATE 40 (H) 01/13/2022   ESRSEDRATE 55 (H) 01/11/2022   ESRSEDRATE 40 (H) 09/03/2021   CRP 3.6 (H) 01/13/2022   CRP 4.2 (H) 01/11/2022   CRP 2.7 (H) 09/07/2021   REPTSTATUS 01/16/2022 FINAL 01/11/2022   GRAMSTAIN  06/14/2021    NO ORGANISMS SEEN SQUAMOUS EPITHELIAL CELLS PRESENT ABUNDANT WBC PRESENT,BOTH PMN AND MONONUCLEAR MODERATE GRAM POSITIVE COCCI    CULT  01/11/2022    NO  GROWTH 5 DAYS Performed at [facility], [facility] [address], [place], [place] [address]    LABORGA ENTEROCOCCUS FAECALIS 06/14/2021     Lab Results  Component Value Date   ALBUMIN 3.1 (L) 02/14/2022   ALBUMIN 2.6 (L) 01/14/2022   ALBUMIN 3.0 (L) 01/13/2022   PREALBUMIN 19.0 05/04/2016   PREALBUMIN 25.0 12/16/2015    Lab Results  Component Value Date   MG 1.8 01/14/2022   MG 1.9 05/24/2018   MG 2.1 05/23/2018   No results found for: "VD25OH"  Lab Results  Component Value Date   PREALBUMIN 19.0 05/04/2016   PREALBUMIN 25.0 12/16/2015      Latest Ref Rng & Units 02/14/2022   12:35 PM 01/14/2022    1:11 AM 01/13/2022    6:33 AM  CBC EXTENDED  WBC 4.0 - 10.5 K/uL 5.0  4.4  4.4   RBC 4.22 - 5.81 MIL/uL 3.47  2.96  3.53   Hemoglobin 13.0 - 17.0 g/dL 10.8  9.2  10.6   HCT 39.0 - 52.0 % 32.1  26.2  31.7   Platelets 150 - 400 K/uL 142  165  165   NEUT# 1.7 - 7.7 K/uL 3.3     Lymph# 0.7 - 4.0 K/uL 1.0        There is no height or weight on file  to calculate BMI.  Orders:  No orders of the defined types were placed in this encounter.  No orders of the defined types were placed in this encounter.    Procedures: No procedures performed  Clinical Data: No additional findings.  ROS:  All other systems negative, except as noted in the HPI. Review of Systems  Objective: Vital Signs: There were no vitals taken for this visit.  Specialty Comments:  No specialty comments available.  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 ([place]) 11/27/2021    Vitreous hemorrhage of right eye ([place]) 30/16/0109   Acute metabolic encephalopathy 32/35/5732   COVID 09/02/2021   Sepsis ([place]) 09/02/2021   Severe sepsis with lactic acidosis ([place]) 06/14/2021   Infection associated with implanted penile prosthesis ([place]) 06/14/2021   ESRD on hemodialysis ([place]) 06/14/2021   Paroxysmal atrial fibrillation with RVR ([place]) 06/14/2021   Carotid stenosis 06/15/2020   Dyslipidemia 06/15/2020   Diabetic macular edema of right eye with proliferative retinopathy associated with type 2 diabetes mellitus ([place]) 01/18/2020   Severe nonproliferative diabetic retinopathy of left eye, with macular edema, associated with type 2 diabetes mellitus ([place]) 01/04/2020   Retinal hemorrhage of right eye 01/04/2020   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   Pseudoclaudication 11/15/2018   Essential hypertension 09/18/2018   S/P BKA (below knee amputation), right ([place]) 09/18/2018   Amputation of toe of left foot ([place]) 09/18/2018   Peripheral artery disease ([place]) 09/18/2018   S/P carotid endarterectomy 09/18/2018   OSA not tolerating CPAP 09/18/2018   Chronic back pain 09/18/2018   Status post reversal of ileostomy 05/21/2018   Normocytic anemia 02/14/2018   Intra-abdominal abscess ([place]) 11/04/2017   Chronic venous hypertension (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 ([place]) 12/11/2016   PAD (peripheral artery disease) ([place]) 11/12/2016   Unilateral primary osteoarthritis, left knee 10/11/2016   History of right below knee amputation ([place]) 07/12/2016   Infected diabetic left foot ulcer with chronic osteomyelitis    Diabetic foot infection ([place]) 05/04/2016   Insulin dependent type 2 diabetes mellitus ([place]) 12/16/2015   Amputated toe ([place]) 10/21/2015   Leg edema, left 09/03/2015   CAD (dz of distal, mid and proximal RCA with implantation of 3 overlapping drug-eluting stent,) 09/03/2015   Chronic diastolic heart failure, NYHA class 2 ([place]) 09/03/2015   Angina  pectoris associated with type 2 diabetes mellitus ([place]) 10/28/2014   Hyperlipidemia 08/25/2014   Limb pain 03/20/2013   DJD (degenerative joint disease) 09/25/2012   Migraine 09/25/2012   Neuropathy 09/25/2012   Restless legs syndrome (RLS) 09/25/2012   Chronic obstructive pulmonary disease, unspecified ([place]) 04/25/2012   Unknown cause of morbidity or mortality 04/25/2012   Chronic total occlusion of artery of the extremities ([place]) 04/08/2012   Onychomycosis 02/01/2012   Occlusion and stenosis of carotid artery without mention of cerebral infarction 06/12/2011   GERD (gastroesophageal reflux disease) 05/08/2011   Barrett's esophagus without dysplasia 05/08/2011   Former tobacco use 02/01/2011   Past Medical History:  Diagnosis Date   Carotid artery occlusion 11/10/10   LEFT CAROTID ENDARTERECTOMY   Chronic kidney disease  Complication of anesthesia    BP WENT UP AT [facility] "   COPD (chronic obstructive pulmonary disease) ([place])    pt denies this dx as of 06/01/20 - no inhaler    Diabetes mellitus without complication ([place])    Diverticulitis    Diverticulosis of colon (without mention of hemorrhage)    DJD (degenerative joint disease)    knees/hands/feet/back/neck   Fatty liver    Full dentures    GERD (gastroesophageal reflux disease)    H/O hiatal hernia    History of blood transfusion    with a past surical procedure per patient 06/01/20   Hyperlipidemia    Hypertension    Neuromuscular disorder ([place])    peripheral neuropathy   Non-pressure chronic ulcer of other part of left foot limited to breakdown of skin ([place]) 11/12/2016   Osteomyelitis (HCC)    left 5th metatarsal   PAD (peripheral artery disease) ([place])    Distal aortogram June 2012. Atherectomy left popliteal artery July 2012.    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 Before age 68   Hyperlipidemia Brother    Hypertension Brother    Hypertension Son    Arthritis Other        GRANDMOTHER   Hypertension Other        OTHER FAMILY MEMBERS    Past Surgical History:  Procedure Laterality Date   ABDOMINAL AORTOGRAM W/LOWER EXTREMITY N/A 06/23/2021   Procedure: ABDOMINAL AORTOGRAM W/LOWER EXTREMITY;  Surgeon: Nigel Mormon, MD;  Location: [facility];  Service: Cardiovascular;  Laterality: N/A;   AMPUTATION  11/05/2011   Procedure: AMPUTATION RAY;  Surgeon: Wylene Simmer, MD;  Location: [facility];  Service: Orthopedics;  Laterality: Right;  Amputation of Right 4&5th Toes   AMPUTATION Left 11/26/2012   Procedure: AMPUTATION RAY;  Surgeon: Wylene Simmer, MD;  Location: [facility];  Service: Orthopedics;  Laterality: Left;  fourth ray amputation   AMPUTATION Right 08/27/2014   Procedure: Transmetatarsal Amputation;  Surgeon: Newt Minion, MD;  Location: [facility];  Service: Orthopedics;  Laterality: Right;   AMPUTATION Right 01/14/2015   Procedure: AMPUTATION BELOW KNEE;  Surgeon: Newt Minion, MD;  Location: [facility];  Service: Orthopedics;  Laterality: Right;   AMPUTATION Left 10/21/2015   Procedure: Left Foot 5th Ray Amputation;  Surgeon: Newt Minion, MD;  Location: [facility];  Service: Orthopedics;  Laterality: Left;   ANTERIOR FUSION CERVICAL SPINE  02/06/06   C4-5, C5-6, C6-7; SURGEON  DR. Rennis Harding   AV FISTULA PLACEMENT Left 06/02/2020   Procedure:  ARTERIOVENOUS (AV) FISTULA CREATION LEFT;  Surgeon: Waynetta Sandy, MD;  Location: [facility];  Service: Vascular;  Laterality: Left;   BACK SURGERY     x 3   [place] Left 07/21/2020   Procedure: LEFT UPPER ARM ATERIOVENOUS SUPERFISTULALIZATION;  Surgeon: Waynetta Sandy, MD;  Location: [facility];  Service: Vascular;  Laterality: Left;   BELOW KNEE LEG AMPUTATION Right    CARDIAC CATHETERIZATION  10/31/04   2009   CAROTID ENDARTERECTOMY  11/10/10   CAROTID ENDARTERECTOMY Left 11/10/2010   Subtotal occlusion of left internal carotid artery with left hemispheric transient ischemic attacks.   CAROTID STENT     CARPAL TUNNEL RELEASE Right 10/21/2013   Procedure: RIGHT CARPAL TUNNEL RELEASE;  Surgeon: Wynonia Sours, MD;  Location: [facility];  Service: Orthopedics;  Laterality: Right;   CHOLECYSTECTOMY     COLON SURGERY     COLONOSCOPY     COLOSTOMY REVERSAL  05/21/2018   ileostomy reversal   CYSTOSCOPY WITH STENT PLACEMENT Bilateral 01/13/2018   Procedure: CYSTOSCOPY WITH BILATERAL URETERAL CATHETER PLACEMENT;  Surgeon: Ardis Hughs, MD;  Location: [facility];  Service: Urology;  Laterality: Bilateral;   ESOPHAGEAL MANOMETRY Bilateral 07/19/2014   Procedure: ESOPHAGEAL MANOMETRY (EM);  Surgeon: Jerene Bears, MD;  [facility];  Service: Gastroenterology;  Laterality: Bilateral;   EYE SURGERY Bilateral 2020   cataract   FEMORAL ARTERY STENT     x6   FINGER SURGERY     FOOT SURGERY  04/25/2016    EXCISION BASE 5TH METATARSAL AND PARTIAL CUBOID LEFT FOOT   HERNIA REPAIR     LEFT INGUINAL AND UMBILICAL REPAIRS   HERNIA REPAIR     I & D EXTREMITY Left 04/25/2016   Procedure: EXCISION BASE 5TH METATARSAL AND PARTIAL CUBOID LEFT FOOT;  Surgeon: Newt Minion, MD;  Location: [facility];  Service: Orthopedics;  Laterality: Left;   ILEOSTOMY  01/13/2018   Procedure: ILEOSTOMY;  Surgeon: Clovis Riley, MD;  Location: [facility];  Service: General;;    ILEOSTOMY CLOSURE N/A 05/21/2018   Procedure: ILEOSTOMY REVERSAL ERAS PATHWAY;  Surgeon: Clovis Riley, MD;  Location: [facility];  Service: General;  Laterality: N/A;   IR RADIOLOGIST EVAL & MGMT  11/19/2017   IR RADIOLOGIST EVAL & MGMT  12/03/2017   IR RADIOLOGIST EVAL & MGMT  12/18/2017   JOINT REPLACEMENT Right 2001   Total knee   LAMINECTOMY     X 3 LUMBAR AND X 2 CERVICAL SPINE OPERATIONS   LAPAROSCOPIC CHOLECYSTECTOMY W/ CHOLANGIOGRAPHY  11/09/04   SURGEON DR. Livingston CATH AND CORONARY ANGIOGRAPHY N/A 09/16/2018   Procedure: LEFT HEART CATH AND CORONARY ANGIOGRAPHY;  Surgeon: Nigel Mormon, MD;  Location: [facility];  Service: Cardiovascular;  Laterality: N/A;   LEFT HEART CATHETERIZATION WITH CORONARY ANGIOGRAM N/A 10/29/2014   Procedure: LEFT HEART CATHETERIZATION WITH CORONARY ANGIOGRAM;  Surgeon: Laverda Page, MD;  Location: [facility];  Service: Cardiovascular;  Laterality: N/A;   LIGATION OF COMPETING BRANCHES OF ARTERIOVENOUS FISTULA Left 07/21/2020   Procedure: LIGATION OF COMPETING BRANCHES OF LEFT UPPER ARM ARTERIOVENOUS FISTULA;  Surgeon: Waynetta Sandy, MD;  Location: [facility];  Service: Vascular;  Laterality: Left;   LOWER EXTREMITY ANGIOGRAM N/A 03/19/2012   Procedure: LOWER EXTREMITY ANGIOGRAM;  Surgeon: Burnell Blanks, MD;  Location[facility];  Service: Cardiovascular;  Laterality: N/A;   LOWER EXTREMITY ANGIOGRAPHY N/A 06/20/2021   Procedure: LOWER EXTREMITY ANGIOGRAPHY;  Surgeon: Nigel Mormon, MD;  Location: [facility];  Service: Cardiovascular;  Laterality: N/A;   NECK SURGERY     PARTIAL COLECTOMY N/A 01/13/2018   Procedure: LAPAROSCOPIC ASSISTED   SIGMOID COLECTOMY ILEOSTOMY;  Surgeon: Clovis Riley, MD;  Location[facility];  Service: General;  Laterality: N/A;   PENILE PROSTHESIS IMPLANT  08/14/05   INFRAPUBIC INSERTION OF INFLATABLE PENILE PROSTHESIS; SURGEON DR. Amalia Hailey   PENILE PROSTHESIS IMPLANT      PERCUTANEOUS CORONARY STENT INTERVENTION (PCI-S) Right 10/29/2014   Procedure: PERCUTANEOUS CORONARY STENT INTERVENTION (PCI-S);  Surgeon: Laverda Page, MD;  Location[facility];  Service: Cardiovascular;  Laterality: Right;   PERIPHERAL VASCULAR INTERVENTION Left 06/23/2021   Procedure: PERIPHERAL VASCULAR INTERVENTION;  Surgeon: Nigel Mormon, MD;  Location: [facility];  Service: Cardiovascular;  Laterality: Left;   REMOVAL OF PENILE PROSTHESIS N/A 06/14/2021   Procedure: Removal of THREE piece inflatable penile prosthesis;  Surgeon: Lucas Mallow, MD;  Location: [facility];  Service: Urology;  Laterality: N/A;   SHOULDER ARTHROSCOPY     SPINE SURGERY     TOE AMPUTATION Left    TONSILLECTOMY     TOTAL KNEE ARTHROPLASTY  07/2002   RIGHT KNEE ; SURGEON  DR. [name] ALSO HAD ARTHROSCOPIC RIGHT KNEE IN  10/2001   TOTAL KNEE ARTHROPLASTY     ULNAR NERVE TRANSPOSITION Right 10/21/2013   Procedure: RIGHT ELBOW  ULNAR NERVE DECOMPRESSION;  Surgeon: Wynonia Sours, MD;  Location: [facility];  Service: Orthopedics;  Laterality: Right;   Social History   Occupational History   Occupation: Magazine features editor: UNEMPLOYED  Tobacco Use   Smoking status: Former    Packs/day: 2.00    Years: 35.00    Total pack years: 70.00    Types: Cigarettes    Quit date: 10/28/2011    Years since quitting: 10.4   Smokeless tobacco: Never  Vaping Use   Vaping Use: Some days   Substances: Nicotine  Substance and Sexual Activity   Alcohol use: Not Currently    Comment: "not in a long time"   Drug use: Never   Sexual activity: Yes    Birth control/protection: Implant    Comment: penile implant

## 2022-04-10 DIAGNOSIS — N186 End stage renal disease: Secondary | ICD-10-CM | POA: Diagnosis not present

## 2022-04-10 DIAGNOSIS — Z992 Dependence on renal dialysis: Secondary | ICD-10-CM | POA: Diagnosis not present

## 2022-04-10 DIAGNOSIS — N2581 Secondary hyperparathyroidism of renal origin: Secondary | ICD-10-CM | POA: Diagnosis not present

## 2022-04-11 ENCOUNTER — Encounter (HOSPITAL_COMMUNITY): Payer: Self-pay

## 2022-04-12 DIAGNOSIS — N2581 Secondary hyperparathyroidism of renal origin: Secondary | ICD-10-CM | POA: Diagnosis not present

## 2022-04-12 DIAGNOSIS — Z992 Dependence on renal dialysis: Secondary | ICD-10-CM | POA: Diagnosis not present

## 2022-04-12 DIAGNOSIS — N186 End stage renal disease: Secondary | ICD-10-CM | POA: Diagnosis not present

## 2022-04-14 DIAGNOSIS — N186 End stage renal disease: Secondary | ICD-10-CM | POA: Diagnosis not present

## 2022-04-14 DIAGNOSIS — N2581 Secondary hyperparathyroidism of renal origin: Secondary | ICD-10-CM | POA: Diagnosis not present

## 2022-04-14 DIAGNOSIS — Z992 Dependence on renal dialysis: Secondary | ICD-10-CM | POA: Diagnosis not present

## 2022-04-16 ENCOUNTER — Ambulatory Visit: Payer: Medicare HMO | Admitting: Orthopedic Surgery

## 2022-04-16 DIAGNOSIS — I87332 Chronic venous hypertension (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: Secondary | ICD-10-CM | POA: Diagnosis not present

## 2022-04-17 ENCOUNTER — Encounter: Payer: Self-pay | Admitting: Orthopedic Surgery

## 2022-04-17 DIAGNOSIS — N186 End stage renal disease: Secondary | ICD-10-CM | POA: Diagnosis not present

## 2022-04-17 DIAGNOSIS — Z992 Dependence on renal dialysis: Secondary | ICD-10-CM | POA: Diagnosis not present

## 2022-04-17 DIAGNOSIS — N2581 Secondary hyperparathyroidism of renal origin: Secondary | ICD-10-CM | POA: Diagnosis not present

## 2022-04-17 NOTE — Progress Notes (Signed)
Office Visit Note   Patient: [name]           Date of Birth: 06/03/1956           MRN: [id number] Visit Date: 04/16/2022              Requested by: [name], PA-C [id number] [place],  [place] [address] PCP: [name], PA-C  Chief Complaint  Patient presents with   Left Foot - Wound Check      HPI: Patient is a 66 year old gentleman who presents in follow-up for ischemic ulcer base of the lateral left foot.  Patient is currently undergoing home health nursing visit skilled dressing changes with compression 2 times a week.  Assessment & Plan: Visit Diagnoses:  1. Chronic venous hypertension (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 (HCC)     Plan: Continue with home health nursing dressing changes.  A silver alginate and compression wrap was applied today.  Follow-Up Instructions: Return in about 4 weeks (around 05/14/2022).   Ortho Exam  Patient is alert, oriented, no adenopathy, well-dressed, normal affect, normal respiratory effort. Examination patient's ulcer is stable and slowly healing its 2 cm in diameter 2 mm deep with fibrinous tissue at the base.  This does not probe to bone or tendon.  There is no cellulitis no odor or drainage.  Imaging: No results found. No images are attached to the encounter.  Labs: Lab Results  Component Value Date   HGBA1C 7.6 (H) 01/11/2022   HGBA1C 7.9 (H) 06/14/2021   HGBA1C 5.7 (H) 09/16/2018   ESRSEDRATE 40 (H) 01/13/2022   ESRSEDRATE 55 (H) 01/11/2022   ESRSEDRATE 40 (H) 09/03/2021   CRP 3.6 (H) 01/13/2022   CRP 4.2 (H) 01/11/2022   CRP 2.7 (H) 09/07/2021   REPTSTATUS 01/16/2022 FINAL 01/11/2022   GRAMSTAIN  06/14/2021    NO ORGANISMS SEEN SQUAMOUS EPITHELIAL CELLS PRESENT ABUNDANT WBC PRESENT,BOTH PMN AND MONONUCLEAR MODERATE GRAM POSITIVE COCCI    CULT  01/11/2022    NO GROWTH 5 DAYS Performed at [facility], [place] [address], [place], [place] [address]    LABORGA  ENTEROCOCCUS FAECALIS 06/14/2021     Lab Results  Component Value Date   ALBUMIN 3.1 (L) 02/14/2022   ALBUMIN 2.6 (L) 01/14/2022   ALBUMIN 3.0 (L) 01/13/2022   PREALBUMIN 19.0 05/04/2016   PREALBUMIN 25.0 12/16/2015    Lab Results  Component Value Date   MG 1.8 01/14/2022   MG 1.9 05/24/2018   MG 2.1 05/23/2018   No results found for: "VD25OH"  Lab Results  Component Value Date   PREALBUMIN 19.0 05/04/2016   PREALBUMIN 25.0 12/16/2015      Latest Ref Rng & Units 02/14/2022   12:35 PM 01/14/2022    1:11 AM 01/13/2022    6:33 AM  CBC EXTENDED  WBC 4.0 - 10.5 K/uL 5.0  4.4  4.4   RBC 4.22 - 5.81 MIL/uL 3.47  2.96  3.53   Hemoglobin 13.0 - 17.0 g/dL 10.8  9.2  10.6   HCT 39.0 - 52.0 % 32.1  26.2  31.7   Platelets 150 - 400 K/uL 142  165  165   NEUT# 1.7 - 7.7 K/uL 3.3     Lymph# 0.7 - 4.0 K/uL 1.0        There is no height or weight on file to calculate BMI.  Orders:  No orders of the defined types were placed in this encounter.  No orders of the defined types were placed in this encounter.    Procedures: No procedures performed  Clinical Data: No additional findings.  ROS:  All other systems negative, except as noted in the HPI. Review of Systems  Objective: Vital Signs: There were no vitals taken for this visit.  Specialty Comments:  No specialty comments available.  PMFS History: Patient Active Problem List   Diagnosis Date Noted   Atrial fibrillation, chronic ([place]) 01/13/2022   Physical deconditioning 01/13/2022   Anemia of renal disease 01/13/2022   Obesity (BMI 30-39.9) 01/13/2022   Pressure injury of skin 01/13/2022   Intractable nausea and vomiting with epigastric pain 01/11/2022   Osteomyelitis ([place]) 01/11/2022   Proliferative diabetic retinopathy of left eye with macular edema associated with type 2 diabetes mellitus ([place]) 11/27/2021   Vitreous hemorrhage of right eye ([place]) 93/73/4287   Acute metabolic encephalopathy 68/07/5725   COVID  09/02/2021   Sepsis ([place]) 09/02/2021   Severe sepsis with lactic acidosis ([place]) 06/14/2021   Infection associated with implanted penile prosthesis ([place]) 06/14/2021   ESRD on hemodialysis ([place]) 06/14/2021   Paroxysmal atrial fibrillation with RVR ([place]) 06/14/2021   Carotid stenosis 06/15/2020   Dyslipidemia 06/15/2020   Diabetic macular edema of right eye with proliferative retinopathy associated with type 2 diabetes mellitus ([place]) 01/18/2020   Severe nonproliferative diabetic retinopathy of left eye, with macular edema, associated with type 2 diabetes mellitus ([place]) 01/04/2020   Retinal hemorrhage of right eye 01/04/2020   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   Pseudoclaudication 11/15/2018   Essential hypertension 09/18/2018   S/P BKA (below knee amputation), right ([place]) 09/18/2018   Amputation of toe of left foot ([place]) 09/18/2018   Peripheral artery disease ([place]) 09/18/2018   S/P carotid endarterectomy 09/18/2018   OSA not tolerating CPAP 09/18/2018   Chronic back pain 09/18/2018   Status post reversal of ileostomy 05/21/2018   Normocytic anemia 02/14/2018   Intra-abdominal abscess ([place]) 11/04/2017   Chronic venous hypertension (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 ([place]) 12/11/2016   PAD (peripheral artery disease) ([place]) 11/12/2016   Unilateral primary osteoarthritis, left knee 10/11/2016   History of right below knee amputation ([place]) 07/12/2016   Infected diabetic left foot ulcer with chronic osteomyelitis    Diabetic foot infection ([place]) 05/04/2016   Insulin dependent type 2 diabetes mellitus ([place]) 12/16/2015   Amputated toe ([place]) 10/21/2015   Leg edema, left 09/03/2015   CAD (dz of distal, mid and proximal RCA with implantation of 3 overlapping drug-eluting stent,) 09/03/2015   Chronic diastolic heart failure, NYHA class 2 ([place]) 09/03/2015   Angina pectoris associated with type 2 diabetes mellitus ([place]) 10/28/2014   Hyperlipidemia 08/25/2014   Limb pain  03/20/2013   DJD (degenerative joint disease) 09/25/2012   Migraine 09/25/2012   Neuropathy 09/25/2012   Restless legs syndrome (RLS) 09/25/2012   Chronic obstructive pulmonary disease, unspecified ([place]) 04/25/2012   Unknown cause of morbidity or mortality 04/25/2012   Chronic total occlusion of artery of the extremities ([place]) 04/08/2012   Onychomycosis 02/01/2012   Occlusion and stenosis of carotid artery without mention of cerebral infarction 06/12/2011   GERD (gastroesophageal reflux disease) 05/08/2011   Barrett's esophagus without dysplasia 05/08/2011   Former tobacco use 02/01/2011   Past Medical History:  Diagnosis Date   Carotid artery occlusion 11/10/10   LEFT CAROTID ENDARTERECTOMY   Chronic kidney disease    Complication of anesthesia    BP WENT UP AT [facility] "   COPD (chronic obstructive  pulmonary disease) ([place])    pt denies this dx as of 06/01/20 - no inhaler    Diabetes mellitus without complication ([place])    Diverticulitis    Diverticulosis of colon (without mention of hemorrhage)    DJD (degenerative joint disease)    knees/hands/feet/back/neck   Fatty liver    Full dentures    GERD (gastroesophageal reflux disease)    H/O hiatal hernia    History of blood transfusion    with a past surical procedure per patient 06/01/20   Hyperlipidemia    Hypertension    Neuromuscular disorder ([place])    peripheral neuropathy   Non-pressure chronic ulcer of other part of left foot limited to breakdown of skin ([place]) 11/12/2016   Osteomyelitis (HCC)    left 5th metatarsal   PAD (peripheral artery disease) ([place])    Distal aortogram June 2012. Atherectomy left popliteal artery July 2012.    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 Before age 2   Hyperlipidemia Brother    Hypertension Brother    Hypertension Son    Arthritis Other        GRANDMOTHER   Hypertension Other        OTHER FAMILY MEMBERS    Past Surgical History:  Procedure Laterality Date   ABDOMINAL AORTOGRAM W/LOWER EXTREMITY N/A 06/23/2021   Procedure: ABDOMINAL AORTOGRAM W/LOWER EXTREMITY;  Surgeon: Nigel Mormon, MD;  Location: [facility];  Service: Cardiovascular;  Laterality: N/A;   AMPUTATION  11/05/2011   Procedure: AMPUTATION RAY;  Surgeon: Wylene Simmer, MD;  Location: [facility];  Service: Orthopedics;  Laterality: Right;  Amputation of Right 4&5th Toes   AMPUTATION Left 11/26/2012   Procedure: AMPUTATION RAY;  Surgeon: Wylene Simmer, MD;  Location: [facility];  Service: Orthopedics;  Laterality: Left;  fourth ray amputation   AMPUTATION Right 08/27/2014   Procedure: Transmetatarsal Amputation;  Surgeon: Newt Minion, MD;  Location: [facility];  Service: Orthopedics;  Laterality: Right;   AMPUTATION Right 01/14/2015   Procedure: AMPUTATION BELOW KNEE;  Surgeon: Newt Minion, MD;  Location: [facility];  Service: Orthopedics;  Laterality: Right;   AMPUTATION Left 10/21/2015   Procedure: Left Foot 5th Ray Amputation;  Surgeon: Newt Minion, MD;  Location: [facility];  Service: Orthopedics;  Laterality: Left;   ANTERIOR FUSION CERVICAL SPINE  02/06/06   C4-5, C5-6, C6-7; SURGEON DR. MAX COHEN   AV FISTULA PLACEMENT Left 06/02/2020   Procedure: ARTERIOVENOUS (AV) FISTULA CREATION  LEFT;  Surgeon: Waynetta Sandy, MD;  Location: [facility];  Service: Vascular;   Laterality: Left;   BACK SURGERY     x 3   [place] Left 07/21/2020   Procedure: LEFT UPPER ARM ATERIOVENOUS SUPERFISTULALIZATION;  Surgeon: Waynetta Sandy, MD;  Location: [facility];  Service: Vascular;  Laterality: Left;   BELOW KNEE LEG AMPUTATION Right    CARDIAC CATHETERIZATION  10/31/04   2009   CAROTID ENDARTERECTOMY  11/10/10   CAROTID ENDARTERECTOMY Left 11/10/2010   Subtotal occlusion of left internal carotid artery with left hemispheric transient ischemic attacks.   CAROTID STENT     CARPAL TUNNEL RELEASE Right 10/21/2013   Procedure: RIGHT CARPAL TUNNEL RELEASE;  Surgeon: Wynonia Sours, MD;  Location: [facility];  Service: Orthopedics;  Laterality: Right;   CHOLECYSTECTOMY     COLON SURGERY     COLONOSCOPY     COLOSTOMY REVERSAL  05/21/2018   ileostomy reversal   CYSTOSCOPY WITH STENT PLACEMENT Bilateral 01/13/2018   Procedure: CYSTOSCOPY WITH BILATERAL URETERAL CATHETER PLACEMENT;  Surgeon: Ardis Hughs, MD;  Location: [facility];  Service: Urology;  Laterality: Bilateral;   ESOPHAGEAL MANOMETRY Bilateral 07/19/2014   Procedure: ESOPHAGEAL MANOMETRY (EM);  Surgeon: Jerene Bears, MD;  [facility];  Service: Gastroenterology;  Laterality: Bilateral;   EYE SURGERY Bilateral 2020   cataract   FEMORAL ARTERY STENT     x6   FINGER SURGERY     FOOT SURGERY  04/25/2016    EXCISION BASE 5TH METATARSAL AND PARTIAL CUBOID LEFT FOOT   HERNIA REPAIR     LEFT INGUINAL AND UMBILICAL REPAIRS   HERNIA REPAIR     I & D EXTREMITY Left 04/25/2016   Procedure: EXCISION BASE 5TH METATARSAL AND PARTIAL CUBOID LEFT FOOT;  Surgeon: Newt Minion, MD;  Location: [facility];  Service: Orthopedics;  Laterality: Left;   ILEOSTOMY  01/13/2018   Procedure: ILEOSTOMY;  Surgeon: Clovis Riley, MD;  Location[facility];  Service: General;;   ILEOSTOMY CLOSURE N/A 05/21/2018   Procedure: ILEOSTOMY REVERSAL ERAS PATHWAY;  Surgeon: Clovis Riley, MD;  Location:  [facility];  Service: General;  Laterality: N/A;   IR RADIOLOGIST EVAL & MGMT  11/19/2017   IR RADIOLOGIST EVAL & MGMT  12/03/2017   IR RADIOLOGIST EVAL & MGMT  12/18/2017   JOINT REPLACEMENT Right 2001   Total knee   LAMINECTOMY     X 3 LUMBAR AND X 2 CERVICAL SPINE OPERATIONS   LAPAROSCOPIC CHOLECYSTECTOMY W/ CHOLANGIOGRAPHY  11/09/04   SURGEON DR. [place] CATH AND CORONARY ANGIOGRAPHY N/A 09/16/2018   Procedure: LEFT HEART CATH AND CORONARY ANGIOGRAPHY;  Surgeon: Nigel Mormon, MD;  Location: [facility];  Service: Cardiovascular;  Laterality: N/A;   LEFT HEART CATHETERIZATION WITH CORONARY ANGIOGRAM N/A 10/29/2014   Procedure: LEFT HEART CATHETERIZATION WITH CORONARY ANGIOGRAM;  Surgeon: Laverda Page, MD;  Location: [facility];  Service: Cardiovascular;  Laterality: N/A;   LIGATION OF COMPETING BRANCHES OF ARTERIOVENOUS FISTULA Left 07/21/2020   Procedure: LIGATION OF COMPETING BRANCHES OF LEFT UPPER ARM ARTERIOVENOUS FISTULA;  Surgeon: Waynetta Sandy, MD;  Location: [place] [facility];  Service: Vascular;  Laterality: Left;   LOWER EXTREMITY ANGIOGRAM N/A 03/19/2012   Procedure: LOWER EXTREMITY ANGIOGRAM;  Surgeon: Burnell Blanks, MD;  Location: [facility];  Service: Cardiovascular;  Laterality: N/A;   LOWER EXTREMITY ANGIOGRAPHY N/A 06/20/2021   Procedure: LOWER  EXTREMITY ANGIOGRAPHY;  Surgeon: Nigel Mormon, MD;  Location: [facility];  Service: Cardiovascular;  Laterality: N/A;   NECK SURGERY     PARTIAL COLECTOMY N/A 01/13/2018   Procedure: LAPAROSCOPIC ASSISTED   SIGMOID COLECTOMY ILEOSTOMY;  Surgeon: Clovis Riley, MD;  [facility];  Service: General;  Laterality: N/A;   PENILE PROSTHESIS IMPLANT  08/14/05   INFRAPUBIC INSERTION OF INFLATABLE PENILE PROSTHESIS; SURGEON DR. Amalia Hailey   PENILE PROSTHESIS IMPLANT     PERCUTANEOUS CORONARY STENT INTERVENTION (PCI-S) Right 10/29/2014   Procedure: PERCUTANEOUS CORONARY STENT INTERVENTION  (PCI-S);  Surgeon: Laverda Page, MD;  Location[facility];  Service: Cardiovascular;  Laterality: Right;   PERIPHERAL VASCULAR INTERVENTION Left 06/23/2021   Procedure: PERIPHERAL VASCULAR INTERVENTION;  Surgeon: Nigel Mormon, MD;  Location: [facility];  Service: Cardiovascular;  Laterality: Left;   REMOVAL OF PENILE PROSTHESIS N/A 06/14/2021   Procedure: Removal of THREE piece inflatable penile prosthesis;  Surgeon: Lucas Mallow, MD;  Location: [facility];  Service: Urology;  Laterality: N/A;   SHOULDER ARTHROSCOPY     SPINE SURGERY     TOE AMPUTATION Left    TONSILLECTOMY     TOTAL KNEE ARTHROPLASTY  07/2002   RIGHT KNEE ; SURGEON  DR. [name] ALSO HAD ARTHROSCOPIC RIGHT KNEE IN  10/2001   TOTAL KNEE ARTHROPLASTY     ULNAR NERVE TRANSPOSITION Right 10/21/2013   Procedure: RIGHT ELBOW  ULNAR NERVE DECOMPRESSION;  Surgeon: Wynonia Sours, MD;  Location: [facility];  Service: Orthopedics;  Laterality: Right;   Social History   Occupational History   Occupation: Magazine features editor: UNEMPLOYED  Tobacco Use   Smoking status: Former    Packs/day: 2.00    Years: 35.00    Total pack years: 70.00    Types: Cigarettes    Quit date: 10/28/2011    Years since quitting: 10.4   Smokeless tobacco: Never  Vaping Use   Vaping Use: Some days   Substances: Nicotine  Substance and Sexual Activity   Alcohol use: Not Currently    Comment: "not in a long time"   Drug use: Never   Sexual activity: Yes    Birth control/protection: Implant    Comment: penile implant

## 2022-04-18 ENCOUNTER — Other Ambulatory Visit: Payer: Medicare Other | Admitting: Student

## 2022-04-18 ENCOUNTER — Telehealth: Payer: Self-pay | Admitting: Orthopedic Surgery

## 2022-04-18 NOTE — Telephone Encounter (Signed)
Cary @ [facility] called stating that she normally go out to see Nicholai on Mondays & Friday but can not because she needs a new recert. States that recert form was faxed to office on last Friday, Monday this wek & yesterday.    [phone] Fax [phone]

## 2022-04-18 NOTE — Telephone Encounter (Signed)
Form had been signed, was pending in our outgoing fax tray to be faxed. I did fax it over.

## 2022-04-19 DIAGNOSIS — N2581 Secondary hyperparathyroidism of renal origin: Secondary | ICD-10-CM | POA: Diagnosis not present

## 2022-04-19 DIAGNOSIS — N186 End stage renal disease: Secondary | ICD-10-CM | POA: Diagnosis not present

## 2022-04-19 DIAGNOSIS — Z992 Dependence on renal dialysis: Secondary | ICD-10-CM | POA: Diagnosis not present

## 2022-04-20 ENCOUNTER — Other Ambulatory Visit: Payer: Medicare HMO | Admitting: Student

## 2022-04-20 DIAGNOSIS — N186 End stage renal disease: Secondary | ICD-10-CM | POA: Diagnosis not present

## 2022-04-20 DIAGNOSIS — R52 Pain, unspecified: Secondary | ICD-10-CM | POA: Diagnosis not present

## 2022-04-20 DIAGNOSIS — S31829D Unspecified open wound of left buttock, subsequent encounter: Secondary | ICD-10-CM | POA: Diagnosis not present

## 2022-04-20 DIAGNOSIS — Z515 Encounter for palliative care: Secondary | ICD-10-CM

## 2022-04-20 DIAGNOSIS — L97521 Non-pressure chronic ulcer of other part of left foot limited to breakdown of skin: Secondary | ICD-10-CM | POA: Diagnosis not present

## 2022-04-20 DIAGNOSIS — R112 Nausea with vomiting, unspecified: Secondary | ICD-10-CM

## 2022-04-20 DIAGNOSIS — Z992 Dependence on renal dialysis: Secondary | ICD-10-CM | POA: Diagnosis not present

## 2022-04-20 NOTE — Progress Notes (Signed)
[facility] Consult Note Telephone: [phone]  Fax: [phone]    Date of encounter: 04/20/22 3:00 PM PATIENT NAME: [name] [address] [place] [address]   [phone] (home)  DOB: [birth year] MRN: [id number] PRIMARY CARE PROVIDER:    Fae [name],  [place] [address] [phone]  REFERRING PROVIDER:   [name], PA-C [place],  [place] [address] [phone]  RESPONSIBLE PARTY:    Contact Information     Name Relation Home Work Mobile   [name],Harold [place] Spouse  [phone]   [name], Harold   [phone]       Due to the COVID-19 crisis, this visit was done via telemedicine from my office and it was initiated and consent by this patient and or family.  I connected with  [name] PROXY on 04/20/22 by a video enabled telemedicine application and verified that I am speaking with the correct person using two identifiers.   I discussed the limitations of evaluation and management by telemedicine. The patient expressed understanding and agreed to proceed.                                   ASSESSMENT AND PLAN / RECOMMENDATIONS:   Advance Care Planning/Goals of Care: Goals include to maximize quality of life and symptom management. Patient/health care surrogate gave his/her permission to discuss. Our advance care planning conversation included a discussion about:    The value and importance of advance care planning  Experiences with loved ones who have been seriously ill or have died  Exploration of personal, cultural or spiritual beliefs that might influence medical decisions  Exploration of goals of care in the event of a sudden injury or illness  CODE STATUS: Full Code  Education provided on Palliative Medicine. Will continue to provide supportive care, symptom management as needed.   Symptom Management/Plan:  ESRD-continues HD on  Tuesday/Thursday/Saturday. He still reports not feeling well and thinks they "are pulling off too much." He is interested in kidney transplant and to speak with nephrology.  Pain- chronic pain, vascular wound/left foot pain. Currently managed with regimen. Continue fentanyl 50 mcg every 72 hours, percocet TID PRN, Lyrica 100 mg BID directed.  Left buttocks wound-reports redness and scabbing to left buttocks. Recommend hydrocolloid dressing, change every 3 days and PRN.   Chronic left foot vascular wound-reports improvement to wound; now being seen by [name] every 4 weeks. Receiving [facility] nurse for wound care twice a week.   Nausea and vomiting-continue Zofran 4 mg every 6 hours PRN.  Follow up Palliative Care Visit: Palliative care will continue to follow for complex medical decision making, advance care planning, and clarification of goals. Return in 8 weeks or prn.   This visit was coded based on medical decision making (MDM).  PPS: 40%  HOSPICE ELIGIBILITY/DIAGNOSIS: TBD  Chief Complaint: Palliative Medicine follow up visit.   HISTORY OF PRESENT ILLNESS:  [name] is a 66 y.o. year old male  with  ESRD on hemodialysis Tuesday-Thursday-Saturday, chronic diastolic CHF, paroxysmal atrial fibrillation, type 2 diabetes, hypertension, hyperlipidemia, CAD, hx of PCI and stent, severe PAD, right BKA with multiple toes amputated on the left foot, chronic left vascular wound, chronic pain, sinus bradycardia, anemia.   Patient states he vomited just before visit today; nausea came on all of a sudden. He has  taken Zofran. Reports pain is managed; still with some breakthrough pain. He reports still not feeling well during dialysis and is interested in getting kidney transplant. He states vascular wound is improving; he is now seeing [name] every 4 weeks and also has [facility] nurse change dressing twice weekly.   History obtained from review of EMR, discussion with primary team, and interview with  family, facility staff/caregiver and/or [name].  I reviewed available labs, medications, imaging, studies and related documents from the EMR.  Records reviewed and summarized above.   ROS  10-Point ROS is negative, except for the pertinent positives/negatives detailed per the HPI.  Physical Exam:  Constitutional: NAD General: frail appearing, non-ill appearing EYES: anicteric sclera, lids intact, no discharge  ENMT: intact hearing Pulmonary:  no increased work of breathing, no cough, room air GU: deferred MSK: moves all extremities, right BKA  Skin: no rashes or wounds on visible skin Neuro: +generalized weakness, A & O x 3 Psych: non-anxious affect, pleasant Hem/lymph/immuno: no widespread bruising   Thank you for the opportunity to participate in the care of [name].  The palliative care team will continue to follow. Please call our office at [phone] if we can be of additional assistance.   Harold Slocumb, NP   COVID-19 PATIENT SCREENING TOOL Asked and negative response unless otherwise noted:   Have you had symptoms of covid, tested positive or been in contact with someone with symptoms/positive test in the past 5-10 days? No

## 2022-04-21 DIAGNOSIS — N186 End stage renal disease: Secondary | ICD-10-CM | POA: Diagnosis not present

## 2022-04-21 DIAGNOSIS — N2581 Secondary hyperparathyroidism of renal origin: Secondary | ICD-10-CM | POA: Diagnosis not present

## 2022-04-21 DIAGNOSIS — Z992 Dependence on renal dialysis: Secondary | ICD-10-CM | POA: Diagnosis not present

## 2022-04-24 DIAGNOSIS — D509 Iron deficiency anemia, unspecified: Secondary | ICD-10-CM | POA: Diagnosis not present

## 2022-04-24 DIAGNOSIS — D631 Anemia in chronic kidney disease: Secondary | ICD-10-CM | POA: Diagnosis not present

## 2022-04-24 DIAGNOSIS — N2581 Secondary hyperparathyroidism of renal origin: Secondary | ICD-10-CM | POA: Diagnosis not present

## 2022-04-24 DIAGNOSIS — N186 End stage renal disease: Secondary | ICD-10-CM | POA: Diagnosis not present

## 2022-04-24 DIAGNOSIS — Z992 Dependence on renal dialysis: Secondary | ICD-10-CM | POA: Diagnosis not present

## 2022-04-26 DIAGNOSIS — N2581 Secondary hyperparathyroidism of renal origin: Secondary | ICD-10-CM | POA: Diagnosis not present

## 2022-04-26 DIAGNOSIS — Z992 Dependence on renal dialysis: Secondary | ICD-10-CM | POA: Diagnosis not present

## 2022-04-26 DIAGNOSIS — N186 End stage renal disease: Secondary | ICD-10-CM | POA: Diagnosis not present

## 2022-04-26 DIAGNOSIS — D631 Anemia in chronic kidney disease: Secondary | ICD-10-CM | POA: Diagnosis not present

## 2022-04-26 DIAGNOSIS — D509 Iron deficiency anemia, unspecified: Secondary | ICD-10-CM | POA: Diagnosis not present

## 2022-04-27 ENCOUNTER — Ambulatory Visit: Payer: Medicare HMO | Admitting: Cardiology

## 2022-04-27 ENCOUNTER — Encounter: Payer: Self-pay | Admitting: Cardiology

## 2022-04-27 VITALS — BP 120/74 | HR 69 | Temp 98.9°F | Resp 16 | Ht 74.0 in | Wt 269.8 lb

## 2022-04-27 DIAGNOSIS — I951 Orthostatic hypotension: Secondary | ICD-10-CM

## 2022-04-27 DIAGNOSIS — I2510 Atherosclerotic heart disease of native coronary artery without angina pectoris: Secondary | ICD-10-CM

## 2022-04-27 DIAGNOSIS — Z992 Dependence on renal dialysis: Secondary | ICD-10-CM | POA: Diagnosis not present

## 2022-04-27 DIAGNOSIS — I480 Paroxysmal atrial fibrillation: Secondary | ICD-10-CM | POA: Diagnosis not present

## 2022-04-27 DIAGNOSIS — N186 End stage renal disease: Secondary | ICD-10-CM

## 2022-04-27 NOTE — Progress Notes (Signed)
Primary Physician/Referring:  [name], PA-C  Patient ID: [name], male    DOB: 23-May-1956, 66 y.o.   MRN: [id number]  Chief Complaint  Patient presents with   Coronary Artery Disease   PAD   Atrial Fibrillation   Follow-up    6 months   HPI:    [name]  is a 66 y.o. Caucasian male patient with severe peripheral arterial disease and has history of right below-knee amputation and toe lamputations on the left, multiple peripheral interventions in the past, uncontrolled diabetes mellitus, ESRD, severe diabetic retinopathy, coronary artery disease angioplasty to the right coronary artery on 10/29/2014, right carotid endarterectomy in 2012 , OSA Unable to tolerate CPAP, chronic back pain and neck and back surgery in past and on chronic pain medications, paroxysmal atrial fibrillation on Eliquis, chronic left foot ulceration from severe PAD and DM.  He is presently doing well, remains asymptomatic except he has noticed marked episodes of hypotension and feeling poorly with severe nausea after dialysis and states that his quality of life is poor.  He is presently in palliative care. Uunfortunately continues to smoke e-cigarettes, but has reduced this significantly.   Social History   Tobacco Use   Smoking status: Former    Packs/day: 2.00    Years: 35.00    Total pack years: 70.00    Types: Cigarettes    Quit date: 10/28/2011    Years since quitting: 10.5   Smokeless tobacco: Never  Substance Use Topics   Alcohol use: Not Currently    Comment: "not in a long time"   Marital Status: Married   ROS  Review of Systems  Cardiovascular:  Positive for leg swelling. Negative for chest pain, dyspnea on exertion and palpitations.  Neurological:  Positive for dizziness.   Objective      04/27/2022   12:39 PM 04/27/2022   12:32 PM 02/15/2022   12:48 AM  Vitals with BMI  Height  '6\' 2"'$    Weight  269 lbs 13 oz   BMI  43.32   Systolic 951 884 166  Diastolic 74 75 82  Pulse   69 72    Blood pressure 120/74, pulse 69, temperature 98.9 F (37.2 C), temperature source Temporal, resp. rate 16, height '6\' 2"'$  (1.88 m), weight 269 lb 12.8 oz (122.4 kg), SpO2 96 %. Body mass index is 34.64 kg/m.   Physical Exam Vitals reviewed.  Constitutional:      Appearance: He is obese.     Comments: Well built and moderately obese in no acute distress  Neck:     Vascular: Carotid bruit (bilateral) present. No JVD.  Cardiovascular:     Rate and Rhythm: Normal rate and regular rhythm.     Pulses: Intact distal pulses.          Femoral pulses are 1+ on the right side with bruit and 1+ on the left side with bruit.      Popliteal pulses are 0 on the right side and 0 on the left side.       Dorsalis pedis pulses are 0 on the left side. Right dorsalis pedis pulse not accessible.       Posterior tibial pulses are 0 on the left side. Right posterior tibial pulse not accessible.     Heart sounds: Normal heart sounds. No murmur heard.    No gallop.     Comments: Right BKA. Left brachial artery AV Shunt noted Pulmonary:     Effort: Pulmonary  effort is normal.     Breath sounds: Normal breath sounds.  Abdominal:     General: Abdomen is flat.     Palpations: Abdomen is soft.  Musculoskeletal:     Cervical back: Neck supple.     Left lower leg: Edema (difficult to evaluate due to compression wrapping in place) present.    Laboratory examination:   Recent Labs    01/13/22 0633 01/14/22 0111 02/14/22 1235  NA 137 136 136  K 4.1 4.0 4.2  CL 100 102 95*  CO2 '25 27 29  '$ GLUCOSE 238* 274* 244*  BUN 33* 16 26*  CREATININE 5.92* 3.94* 6.26*  CALCIUM 8.1* 8.0* 7.8*  GFRNONAA 10* 16* 9*      Latest Ref Rng & Units 02/14/2022   12:35 PM 01/14/2022    1:11 AM 01/13/2022    6:33 AM  CMP  Glucose 70 - 99 mg/dL 244  274  238   BUN 8 - 23 mg/dL 26  16  33   Creatinine 0.61 - 1.24 mg/dL 6.26  3.94  5.92   Sodium 135 - 145 mmol/L 136  136  137   Potassium 3.5 - 5.1 mmol/L 4.2  4.0  4.1    Chloride 98 - 111 mmol/L 95  102  100   CO2 22 - 32 mmol/L '29  27  25   '$ Calcium 8.9 - 10.3 mg/dL 7.8  8.0  8.1   Total Protein 6.5 - 8.1 g/dL 6.0   6.0   Total Bilirubin 0.3 - 1.2 mg/dL 0.6   0.5   Alkaline Phos 38 - 126 U/L 114   120   AST 15 - 41 U/L 16   15   ALT 0 - 44 U/L 15   12       Latest Ref Rng & Units 02/14/2022   12:35 PM 01/14/2022    1:11 AM 01/13/2022    6:33 AM  CBC  WBC 4.0 - 10.5 K/uL 5.0  4.4  4.4   Hemoglobin 13.0 - 17.0 g/dL 10.8  9.2  10.6   Hematocrit 39.0 - 52.0 % 32.1  26.2  31.7   Platelets 150 - 400 K/uL 142  165  165    Lipid Panel     Component Value Date/Time   CHOL 117 05/30/2020 1646   TRIG 133 05/30/2020 1646   HDL 24 (L) 05/30/2020 1646   LDLCALC 69 05/30/2020 1646   HEMOGLOBIN A1C Lab Results  Component Value Date   HGBA1C 7.6 (H) 01/11/2022   MPG 171.42 01/11/2022   Allergies   Allergies  Allergen Reactions   Contrast Media [Iodinated Contrast Media] Shortness Of Breath and Other (See Comments)    Difficulty breathing and altered mental status     Ivp Dye [Iodinated Contrast Media] Anaphylaxis, Shortness Of Breath and Other (See Comments)    Breathing problems, altered mental state    Adhesive [Tape] Rash and Other (See Comments)    Rash after 1 day of use   Latex Rash and Other (See Comments)    A severe rash appears after the first 24 hours of being placed    Final Medications at End of Visit     Current Outpatient Medications:    amLODipine (NORVASC) 5 MG tablet, Take 5 mg by mouth daily as needed for dizziness. Take one tablet for BP > 160 mm Hg, Disp: , Rfl:    atorvastatin (LIPITOR) 40 MG tablet, Take 40 mg by mouth at bedtime. ,  Disp: , Rfl:    B Complex-C-Zn-Folic Acid (DIALYVITE/ZINC) TABS, Take 1 tablet by mouth daily., Disp: , Rfl:    doxycycline (VIBRA-TABS) 100 MG tablet, TAKE 1 TABLET BY MOUTH TWICE A DAY, Disp: 60 tablet, Rfl: 0   fentaNYL (DURAGESIC) 50 MCG/HR, Place 1 patch onto the skin every 3 (three)  days., Disp: 2 patch, Rfl: 0   folic acid (FOLVITE) 1 MG tablet, Take 1 mg by mouth daily., Disp: , Rfl:    Glucosamine HCl (GLUCOSAMINE PO), Take 1 tablet by mouth 2 (two) times daily., Disp: , Rfl:    HUMALOG KWIKPEN 100 UNIT/ML KwikPen, Inject 4 Units into the skin 3 (three) times daily. (Patient taking differently: Inject 13 Units into the skin 3 (three) times daily.), Disp: 15 mL, Rfl: 0   insulin glargine (LANTUS) 100 UNIT/ML injection, Inject 0.1 mLs (10 Units total) into the skin at bedtime. (Patient taking differently: Inject 30 Units into the skin at bedtime.), Disp: 10 mL, Rfl: 0   ketoconazole (NIZORAL) 2 % cream, Apply 1 application topically 2 (two) times daily as needed for irritation. , Disp: , Rfl: 1   lidocaine-prilocaine (EMLA) cream, Apply 1 application topically See admin instructions. Prior to Dialysis days Tuesday,Thursday and saturday, Disp: , Rfl:    linaclotide (LINZESS) 145 MCG CAPS capsule, Take 145 mcg by mouth daily as needed (constipation). , Disp: , Rfl:    loratadine (CLARITIN) 10 MG tablet, Take 10 mg by mouth daily., Disp: , Rfl:    metoCLOPramide (REGLAN) 5 MG tablet, Take 1 tablet (5 mg total) by mouth 4 (four) times daily -  before meals and at bedtime. (Patient taking differently: Take 5 mg by mouth 3 (three) times daily before meals.), Disp: 360 tablet, Rfl: 1   midodrine (PROAMATINE) 10 MG tablet, Take 1 tablet (10 mg total) by mouth every other day. One hour before dialysis, Disp: 30 tablet, Rfl: 3   NEEDLE, REUSABLE, 22 G 22G X 1-1/2" MISC, 1 Units by Does not apply route as directed. For B12 IM inj, Disp: 10 each, Rfl: 0   nitroGLYCERIN (NITRODUR - DOSED IN MG/24 HR) 0.2 mg/hr patch, Place 1 patch (0.2 mg total) onto the skin daily., Disp: 30 patch, Rfl: 12   nitroGLYCERIN (NITROSTAT) 0.4 MG SL tablet, Place 0.4 mg under the tongue every 5 (five) minutes as needed for chest pain., Disp: , Rfl:    omega-3 acid ethyl esters (LOVAZA) 1 g capsule, Take 2 g by  mouth 2 (two) times daily., Disp: , Rfl:    ondansetron (ZOFRAN) 4 MG tablet, Take 4 mg by mouth every 6 (six) hours as needed for nausea or vomiting., Disp: , Rfl:    oxyCODONE-acetaminophen (PERCOCET) 7.5-325 MG tablet, Take 1 tablet by mouth 3 (three) times daily as needed for moderate pain. (Patient taking differently: Take 1 tablet by mouth every 8 (eight) hours.), Disp: 10 tablet, Rfl: 0   pantoprazole (PROTONIX) 40 MG tablet, Take 1 tablet (40 mg total) by mouth 2 (two) times daily for 30 days, THEN 1 tablet (40 mg total) daily., Disp: 120 tablet, Rfl: 0   Polyvinyl Alcohol-Povidone PF 1.4-0.6 % SOLN, Place 1 drop into both eyes 2 (two) times daily as needed (dry eyes). , Disp: , Rfl:    pregabalin (LYRICA) 100 MG capsule, Take 100 mg by mouth 2 (two) times daily., Disp: , Rfl:    rOPINIRole (REQUIP) 2 MG tablet, Take 2 mg by mouth at bedtime., Disp: , Rfl:    sevelamer  carbonate (RENVELA) 800 MG tablet, Take 800 mg by mouth 3 (three) times daily., Disp: , Rfl:    silver sulfADIAZINE (SILVADENE) 1 % cream, Apply topically daily., Disp: 50 g, Rfl: 0   Syringe/Needle, Disp, (SYRINGE 3CC/22GX1-1/2") 22G X 1-1/2" 3 ML MISC, 1 Syringe by Does not apply route as directed. For b12 IM inj, Disp: 10 each, Rfl: 0   tamsulosin (FLOMAX) 0.4 MG CAPS capsule, Take 0.4 mg by mouth daily., Disp: , Rfl:    amiodarone (PACERONE) 200 MG tablet, Take 0.5 tablets (100 mg total) by mouth daily., Disp: 100 tablet, Rfl: 1   apixaban (ELIQUIS) 5 MG TABS tablet, Take 1 tablet (5 mg total) by mouth 2 (two) times daily., Disp: 60 tablet, Rfl: 1   Cardiac Studies:   Lexiscan sestamibi stress test 03/18/2017: 1. The resting electrocardiogram demonstrated normal sinus rhythm, RBBB and no resting arrhythmias. Stress EKG is non-diagnostic for ischemia as it a pharmacologic stress using Lexiscan. Occasional PVC noted. Stress symptoms included dyspnea, dizziness and chest pain. 2. The LV is dilated both at rest and stress  images. The LV end diastolic volume was 371GG. Perfusion images reveal inferior soft tissue attenuation artifact. There is no ischemia or scar. Overall left ventricular systolic function was normal without regional wall motion abnormalities. The left ventricular ejection fraction was calculated to be 51%. This is a low risk study. Compared to the study done on 10/15/2014, inferior wall ischemia and associated inferior hypokinesis is no longer present.  Coronary angiogram 09/17/18: Widely patent stent stents. Normal LVEF. Coronary Angiogram 10/29/2014: CAD s/p stenting of the distal, mid and proximal RCA with implantation of 3 overlapping drug-eluting stent, from distal to proximal 2.5 x 38 mm, 2.75 x 38 mm and a 2.75 x 20 mm Promus premier DES.   Carotid artery duplex  06/09/2019:  Minimal stenosis in bilateral ICA of 1-15%. Heterogeneous plaque. Stenosis  in the right external carotid artery (<50%).  Left carotid endarterectomy site is patent.  Right vertebral artery flow is not well visualized. Antegrade left  vertebral artery flow.  No significant change from 03/01/2017. Follow up studies when clinically  Indicated.  Echocardiogram 06/17/2021: 1. Left ventricular ejection fraction, by estimation, is 55 to 60%. The left ventricle has normal function. The left ventricle has no regional wall motion abnormalities. There is mild concentric left ventricular hypertrophy. Left ventricular diastolic parameters are indeterminate.   2. Right ventricular systolic function is normal. The right ventricular size is normal.   3. Left atrial size was moderately dilated.   4. The mitral valve is normal in structure. No evidence of mitral valve regurgitation. Moderate mitral annular calcification.   5. The aortic valve is tricuspid. Aortic valve regurgitation is not visualized. Mild to moderate aortic valve sclerosis/calcification is present, without any evidence of aortic stenosis.  EKG:  EKG 04/27/2022: Sinus rhythm  with first-degree block at rate of 79 bpm, left atrial enlargement, left axis deviation, left intrafascicular block.  Right bundle branch block.  Trifascicular block.  No significant change from 10/25/2021.  06/12/2021: Atypical atrial flutter with RVR at the rate of 106 bpm, left axis deviation, left anterior fascicular block.  Right bundle branch block.  Nonspecific T abnormality.   Assessment     ICD-10-CM   1. Paroxysmal atrial fibrillation with RVR (HCC)  I48.0 EKG 12-Lead    amiodarone (PACERONE) 200 MG tablet    2. Orthostatic hypotension  I95.1     3. Coronary artery disease involving native coronary artery of native  heart without angina pectoris  I25.10     4. ESRD on hemodialysis ([place])  N18.6    Z99.2       No orders of the defined types were placed in this encounter.   Medications Discontinued During This Encounter  Medication Reason   amiodarone (PACERONE) 200 MG tablet   CHA2DS2-VASc Score is 4.  Yearly risk of stroke: 5% (A, HTN, Vasc Dz).  Score of 1=0.6; 2=2.2; 3=3.2; 4=4.8; 5=7.2; 6=9.8; 7=>9.8) -(CHF; HTN; vasc disease DM,  Male = 1; Age <65 =0; 65-74 = 1,  >75 =2; stroke/embolism= 2).     Recommendations:   [name]  is a 66 y.o. Caucasian male patient with severe peripheral arterial disease and has history of right below-knee amputation and toe lamputations on the left, multiple peripheral interventions in the past, uncontrolled diabetes mellitus, ESRD, severe diabetic retinopathy, coronary artery disease angioplasty to the right coronary artery on 10/29/2014, right carotid endarterectomy in 2012 , OSA Unable to tolerate CPAP, chronic back pain and neck and back surgery in past and on chronic pain medications, paroxysmal atrial fibrillation on Eliquis, chronic left foot ulceration from severe PAD and DM.  He is presently doing well, remains asymptomatic except he has noticed marked episodes of hypotension and feeling poorly with severe nausea after dialysis and  states that his quality of life is poor.  He is presently in palliative care.  Although on presentation his blood pressure is markedly elevated, immediately after resting for 5 to 10 minutes, blood pressure plummets to 120 mmHg.  Patient also has similar history when at home and also during dialysis.  Suspect severe arterial sclerosis leading to orthostatic hypotension from diabetic autonomic neuropathy contributing to his presentation.  Previously was on multiple antihypertensive medications all of which have been discontinued and presently only on amlodipine 5 mg daily.  I will discontinue daily dose and switch him to as needed to be used only if the systolic blood pressure is >160 mmHg.  We will reduce the dose of the amiodarone to 100 mg daily as he is maintaining sinus rhythm with regard to atrial fibrillation.  No clinical evidence of decompensated heart failure, he has not had any recurrence of angina pectoris.  Continue medical therapy.  Consider therapy overall, extremely guarded and poor long-term outcome although he is only 66 years of age.   Adrian Prows, MD, [facility] 04/27/2022, 3:03 PM Office: [phone] Fax: [phone] Pager: [phone]

## 2022-04-28 DIAGNOSIS — D509 Iron deficiency anemia, unspecified: Secondary | ICD-10-CM | POA: Diagnosis not present

## 2022-04-28 DIAGNOSIS — Z992 Dependence on renal dialysis: Secondary | ICD-10-CM | POA: Diagnosis not present

## 2022-04-28 DIAGNOSIS — D631 Anemia in chronic kidney disease: Secondary | ICD-10-CM | POA: Diagnosis not present

## 2022-04-28 DIAGNOSIS — N186 End stage renal disease: Secondary | ICD-10-CM | POA: Diagnosis not present

## 2022-04-28 DIAGNOSIS — N2581 Secondary hyperparathyroidism of renal origin: Secondary | ICD-10-CM | POA: Diagnosis not present

## 2022-05-01 DIAGNOSIS — Z992 Dependence on renal dialysis: Secondary | ICD-10-CM | POA: Diagnosis not present

## 2022-05-01 DIAGNOSIS — N186 End stage renal disease: Secondary | ICD-10-CM | POA: Diagnosis not present

## 2022-05-01 DIAGNOSIS — N2581 Secondary hyperparathyroidism of renal origin: Secondary | ICD-10-CM | POA: Diagnosis not present

## 2022-05-03 DIAGNOSIS — N186 End stage renal disease: Secondary | ICD-10-CM | POA: Diagnosis not present

## 2022-05-03 DIAGNOSIS — Z992 Dependence on renal dialysis: Secondary | ICD-10-CM | POA: Diagnosis not present

## 2022-05-03 DIAGNOSIS — N2581 Secondary hyperparathyroidism of renal origin: Secondary | ICD-10-CM | POA: Diagnosis not present

## 2022-05-05 DIAGNOSIS — N186 End stage renal disease: Secondary | ICD-10-CM | POA: Diagnosis not present

## 2022-05-05 DIAGNOSIS — N2581 Secondary hyperparathyroidism of renal origin: Secondary | ICD-10-CM | POA: Diagnosis not present

## 2022-05-05 DIAGNOSIS — Z992 Dependence on renal dialysis: Secondary | ICD-10-CM | POA: Diagnosis not present

## 2022-05-07 ENCOUNTER — Encounter (INDEPENDENT_AMBULATORY_CARE_PROVIDER_SITE_OTHER): Payer: Self-pay | Admitting: Ophthalmology

## 2022-05-07 ENCOUNTER — Ambulatory Visit (INDEPENDENT_AMBULATORY_CARE_PROVIDER_SITE_OTHER): Payer: Medicare HMO | Admitting: Ophthalmology

## 2022-05-07 DIAGNOSIS — E113512 Type 2 diabetes mellitus with proliferative diabetic retinopathy with macular edema, left eye: Secondary | ICD-10-CM | POA: Diagnosis not present

## 2022-05-07 DIAGNOSIS — E113511 Type 2 diabetes mellitus with proliferative diabetic retinopathy with macular edema, right eye: Secondary | ICD-10-CM | POA: Diagnosis not present

## 2022-05-07 DIAGNOSIS — H4311 Vitreous hemorrhage, right eye: Secondary | ICD-10-CM | POA: Diagnosis not present

## 2022-05-07 MED ORDER — AFLIBERCEPT 2MG/0.05ML IZ SOLN FOR KALEIDOSCOPE
2.0000 mg | INTRAVITREAL | Status: AC | PRN
  Administered 2022-05-07: 2 mg via INTRAVITREAL

## 2022-05-07 NOTE — Assessment & Plan Note (Signed)
>>  ASSESSMENT AND PLAN FOR VITREOUS HEMORRHAGE OF RIGHT EYE (HCC) WRITTEN ON 05/07/2022  3:33 PM BY ELNER KUBA A, MD  OD, vastly improved vitreous hemorrhage after commencement of  treatment of CSME with antivegF.

## 2022-05-07 NOTE — Progress Notes (Signed)
05/07/2022     CHIEF COMPLAINT Patient presents for  Chief Complaint  Patient presents with   Diabetic Retinopathy with Macular Edema      HISTORY OF PRESENT ILLNESS: [name] is a 66 y.o. male who presents to the clinic today for:   HPI   5 weeks for DILATE OD, EYLEA, OCT. Pt stated vision has been stable since last visit.  Last edited by Silvestre Moment on 05/07/2022  2:48 PM.      Referring physician: [name], PA-C [place],  [place] [id number]  HISTORICAL INFORMATION:   Selected notes from the MEDICAL RECORD NUMBER    Lab Results  Component Value Date   HGBA1C 7.6 (H) 01/11/2022     CURRENT MEDICATIONS: Current Outpatient Medications (Ophthalmic Drugs)  Medication Sig   Polyvinyl Alcohol-Povidone PF 1.4-0.6 % SOLN Place 1 drop into both eyes 2 (two) times daily as needed (dry eyes).    No current facility-administered medications for this visit. (Ophthalmic Drugs)   Current Outpatient Medications (Other)  Medication Sig   amiodarone (PACERONE) 200 MG tablet Take 0.5 tablets (100 mg total) by mouth daily.   amLODipine (NORVASC) 5 MG tablet Take 5 mg by mouth daily as needed for dizziness. Take one tablet for BP > 160 mm Hg   apixaban (ELIQUIS) 5 MG TABS tablet Take 1 tablet (5 mg total) by mouth 2 (two) times daily.   atorvastatin (LIPITOR) 40 MG tablet Take 40 mg by mouth at bedtime.    B Complex-C-Zn-Folic Acid (DIALYVITE/ZINC) TABS Take 1 tablet by mouth daily.   doxycycline (VIBRA-TABS) 100 MG tablet TAKE 1 TABLET BY MOUTH TWICE A DAY   fentaNYL (DURAGESIC) 50 MCG/HR Place 1 patch onto the skin every 3 (three) days.   folic acid (FOLVITE) 1 MG tablet Take 1 mg by mouth daily.   Glucosamine HCl (GLUCOSAMINE PO) Take 1 tablet by mouth 2 (two) times daily.   HUMALOG KWIKPEN 100 UNIT/ML KwikPen Inject 4 Units into the skin 3 (three) times daily. (Patient taking differently: Inject 13 Units into the skin 3 (three) times daily.)   insulin  glargine (LANTUS) 100 UNIT/ML injection Inject 0.1 mLs (10 Units total) into the skin at bedtime. (Patient taking differently: Inject 30 Units into the skin at bedtime.)   ketoconazole (NIZORAL) 2 % cream Apply 1 application topically 2 (two) times daily as needed for irritation.    lidocaine-prilocaine (EMLA) cream Apply 1 application topically See admin instructions. Prior to Dialysis days Tuesday,Thursday and saturday   linaclotide (LINZESS) 145 MCG CAPS capsule Take 145 mcg by mouth daily as needed (constipation).    loratadine (CLARITIN) 10 MG tablet Take 10 mg by mouth daily.   metoCLOPramide (REGLAN) 5 MG tablet Take 1 tablet (5 mg total) by mouth 4 (four) times daily -  before meals and at bedtime. (Patient taking differently: Take 5 mg by mouth 3 (three) times daily before meals.)   midodrine (PROAMATINE) 10 MG tablet Take 1 tablet (10 mg total) by mouth every other day. One hour before dialysis   NEEDLE, REUSABLE, 22 G 22G X 1-1/2" MISC 1 Units by Does not apply route as directed. For B12 IM inj   nitroGLYCERIN (NITRODUR - DOSED IN MG/24 HR) 0.2 mg/hr patch Place 1 patch (0.2 mg total) onto the skin daily.   nitroGLYCERIN (NITROSTAT) 0.4 MG SL tablet Place 0.4 mg under the tongue every 5 (five) minutes as needed for chest pain.   omega-3 acid ethyl  esters (LOVAZA) 1 g capsule Take 2 g by mouth 2 (two) times daily.   ondansetron (ZOFRAN) 4 MG tablet Take 4 mg by mouth every 6 (six) hours as needed for nausea or vomiting.   oxyCODONE-acetaminophen (PERCOCET) 7.5-325 MG tablet Take 1 tablet by mouth 3 (three) times daily as needed for moderate pain. (Patient taking differently: Take 1 tablet by mouth every 8 (eight) hours.)   pantoprazole (PROTONIX) 40 MG tablet Take 1 tablet (40 mg total) by mouth 2 (two) times daily for 30 days, THEN 1 tablet (40 mg total) daily.   pregabalin (LYRICA) 100 MG capsule Take 100 mg by mouth 2 (two) times daily.   rOPINIRole (REQUIP) 2 MG tablet Take 2 mg by mouth  at bedtime.   sevelamer carbonate (RENVELA) 800 MG tablet Take 800 mg by mouth 3 (three) times daily.   silver sulfADIAZINE (SILVADENE) 1 % cream Apply topically daily.   Syringe/Needle, Disp, (SYRINGE 3CC/22GX1-1/2") 22G X 1-1/2" 3 ML MISC 1 Syringe by Does not apply route as directed. For b12 IM inj   tamsulosin (FLOMAX) 0.4 MG CAPS capsule Take 0.4 mg by mouth daily.   No current facility-administered medications for this visit. (Other)      REVIEW OF SYSTEMS: ROS   Negative for: Constitutional, Gastrointestinal, Neurological, Skin, Genitourinary, Musculoskeletal, HENT, Endocrine, Cardiovascular, Eyes, Respiratory, Psychiatric, Allergic/Imm, Heme/Lymph Last edited by Silvestre Moment on 05/07/2022  2:48 PM.       ALLERGIES Allergies  Allergen Reactions   Contrast Media [Iodinated Contrast Media] Shortness Of Breath and Other (See Comments)    Difficulty breathing and altered mental status     Ivp Dye [Iodinated Contrast Media] Anaphylaxis, Shortness Of Breath and Other (See Comments)    Breathing problems, altered mental state    Adhesive [Tape] Rash and Other (See Comments)    Rash after 1 day of use   Latex Rash and Other (See Comments)    A severe rash appears after the first 24 hours of being placed    PAST MEDICAL HISTORY Past Medical History:  Diagnosis Date   Carotid artery occlusion 11/10/10   LEFT CAROTID ENDARTERECTOMY   Chronic kidney disease    Complication of anesthesia    BP WENT UP AT [facility] "   COPD (chronic obstructive pulmonary disease) ([place])    pt denies this dx as of 06/01/20 - no inhaler    Diabetes mellitus without complication ([place])    Diverticulitis    Diverticulosis of colon (without mention of hemorrhage)    DJD (degenerative joint disease)    knees/hands/feet/back/neck   Fatty liver    Full dentures    GERD (gastroesophageal reflux disease)    H/O hiatal hernia    History of blood transfusion    with a past surical procedure per patient 06/01/20    Hyperlipidemia    Hypertension    Neuromuscular disorder ([place])    peripheral neuropathy   Non-pressure chronic ulcer of other part of left foot limited to breakdown of skin ([place]) 11/12/2016   Osteomyelitis (HCC)    left 5th metatarsal   PAD (peripheral artery disease) ([place])    Distal aortogram June 2012. Atherectomy left popliteal artery July 2012.    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   Unstable angina ([place]) 09/16/2018   Wears glasses    Past  Surgical History:  Procedure Laterality Date   ABDOMINAL AORTOGRAM W/LOWER EXTREMITY N/A 06/23/2021   Procedure: ABDOMINAL AORTOGRAM W/LOWER EXTREMITY;  Surgeon: Nigel Mormon, MD;  Location: [place] [facility];  Service: Cardiovascular;  Laterality: N/A;   AMPUTATION  11/05/2011   Procedure: AMPUTATION RAY;  Surgeon: Wylene Simmer, MD;  Location: [place];  Service: Orthopedics;  Laterality: Right;  Amputation of Right 4&5th Toes   AMPUTATION Left 11/26/2012   Procedure: AMPUTATION RAY;  Surgeon: Wylene Simmer, MD;  Location: [place];  Service: Orthopedics;  Laterality: Left;  fourth ray amputation   AMPUTATION Right 08/27/2014   Procedure: Transmetatarsal Amputation;  Surgeon: Newt Minion, MD;  Location: [place];  Service: Orthopedics;  Laterality: Right;   AMPUTATION Right 01/14/2015   Procedure: AMPUTATION BELOW KNEE;  Surgeon: Newt Minion, MD;  Location: [place];  Service: Orthopedics;  Laterality: Right;   AMPUTATION Left 10/21/2015   Procedure: Left Foot 5th Ray Amputation;  Surgeon: Newt Minion, MD;  Location: [place];  Service: Orthopedics;  Laterality: Left;   ANTERIOR FUSION CERVICAL SPINE  02/06/06   C4-5, C5-6, C6-7; SURGEON DR. MAX COHEN   AV FISTULA PLACEMENT Left 06/02/2020   Procedure: ARTERIOVENOUS (AV) FISTULA CREATION LEFT;  Surgeon: Waynetta Sandy, MD;  Location: [facility];  Service: Vascular;  Laterality:  Left;   BACK SURGERY     x 3   [place] Left 07/21/2020   Procedure: LEFT UPPER ARM ATERIOVENOUS SUPERFISTULALIZATION;  Surgeon: Waynetta Sandy, MD;  Location: [place];  Service: Vascular;  Laterality: Left;   BELOW KNEE LEG AMPUTATION Right    CARDIAC CATHETERIZATION  10/31/04   2009   CAROTID ENDARTERECTOMY  11/10/10   CAROTID ENDARTERECTOMY Left 11/10/2010   Subtotal occlusion of left internal carotid artery with left hemispheric transient ischemic attacks.   CAROTID STENT     CARPAL TUNNEL RELEASE Right 10/21/2013   Procedure: RIGHT CARPAL TUNNEL RELEASE;  Surgeon: Wynonia Sours, MD;  Location: [facility];  Service: Orthopedics;  Laterality: Right;   CHOLECYSTECTOMY     COLON SURGERY     COLONOSCOPY     COLOSTOMY REVERSAL  05/21/2018   ileostomy reversal   CYSTOSCOPY WITH STENT PLACEMENT Bilateral 01/13/2018   Procedure: CYSTOSCOPY WITH BILATERAL URETERAL CATHETER PLACEMENT;  Surgeon: Ardis Hughs, MD;  Location: [facility];  Service: Urology;  Laterality: Bilateral;   ESOPHAGEAL MANOMETRY Bilateral 07/19/2014   Procedure: ESOPHAGEAL MANOMETRY (EM);  Surgeon: Jerene Bears, MD;  [facility];  Service: Gastroenterology;  Laterality: Bilateral;   EYE SURGERY Bilateral 2020   cataract   FEMORAL ARTERY STENT     x6   FINGER SURGERY     FOOT SURGERY  04/25/2016    EXCISION BASE 5TH METATARSAL AND PARTIAL CUBOID LEFT FOOT   HERNIA REPAIR     LEFT INGUINAL AND UMBILICAL REPAIRS   HERNIA REPAIR     I & D EXTREMITY Left 04/25/2016   Procedure: EXCISION BASE 5TH METATARSAL AND PARTIAL CUBOID LEFT FOOT;  Surgeon: Newt Minion, MD;  Location: [place] [facility];  Service: Orthopedics;  Laterality: Left;   ILEOSTOMY  01/13/2018   Procedure: ILEOSTOMY;  Surgeon: Clovis Riley, MD;  [facility];  Service: General;;   ILEOSTOMY CLOSURE N/A 05/21/2018   Procedure: ILEOSTOMY REVERSAL ERAS PATHWAY;  Surgeon: Clovis Riley, MD;  Location: [facility];   Service: General;  Laterality: N/A;   IR RADIOLOGIST EVAL & MGMT  11/19/2017  IR RADIOLOGIST EVAL & MGMT  12/03/2017   IR RADIOLOGIST EVAL & MGMT  12/18/2017   JOINT REPLACEMENT Right 2001   Total knee   LAMINECTOMY     X 3 LUMBAR AND X 2 CERVICAL SPINE OPERATIONS   LAPAROSCOPIC CHOLECYSTECTOMY W/ CHOLANGIOGRAPHY  11/09/04   SURGEON DR. Wallace Ridge CATH AND CORONARY ANGIOGRAPHY N/A 09/16/2018   Procedure: LEFT HEART CATH AND CORONARY ANGIOGRAPHY;  Surgeon: Nigel Mormon, MD;  Location: [facility];  Service: Cardiovascular;  Laterality: N/A;   LEFT HEART CATHETERIZATION WITH CORONARY ANGIOGRAM N/A 10/29/2014   Procedure: LEFT HEART CATHETERIZATION WITH CORONARY ANGIOGRAM;  Surgeon: Laverda Page, MD;  [facility];  Service: Cardiovascular;  Laterality: N/A;   LIGATION OF COMPETING BRANCHES OF ARTERIOVENOUS FISTULA Left 07/21/2020   Procedure: LIGATION OF COMPETING BRANCHES OF LEFT UPPER ARM ARTERIOVENOUS FISTULA;  Surgeon: Waynetta Sandy, MD;  Location: [facility];  Service: Vascular;  Laterality: Left;   LOWER EXTREMITY ANGIOGRAM N/A 03/19/2012   Procedure: LOWER EXTREMITY ANGIOGRAM;  Surgeon: Burnell Blanks, MD;  Location: [facility] C[facility];  Service: Cardiovascular;  Laterality: N/A;   LOWER EXTREMITY ANGIOGRAPHY N/A 06/20/2021   Procedure: LOWER EXTREMITY ANGIOGRAPHY;  Surgeon: Nigel Mormon, MD;  Location: [place] [facility];  Service: Cardiovascular;  Laterality: N/A;   NECK SURGERY     PARTIAL COLECTOMY N/A 01/13/2018   Procedure: LAPAROSCOPIC ASSISTED   SIGMOID COLECTOMY ILEOSTOMY;  Surgeon: Clovis Riley, MD;  [facility];  Service: General;  Laterality: N/A;   PENILE PROSTHESIS IMPLANT  08/14/05   INFRAPUBIC INSERTION OF INFLATABLE PENILE PROSTHESIS; SURGEON DR. Amalia Hailey   PENILE PROSTHESIS IMPLANT     PERCUTANEOUS CORONARY STENT INTERVENTION (PCI-S) Right 10/29/2014   Procedure: PERCUTANEOUS CORONARY STENT INTERVENTION (PCI-S);   Surgeon: Laverda Page, MD;  Location: [facility];  Service: Cardiovascular;  Laterality: Right;   PERIPHERAL VASCULAR INTERVENTION Left 06/23/2021   Procedure: PERIPHERAL VASCULAR INTERVENTION;  Surgeon: Nigel Mormon, MD;  Location: [place] [facility];  Service: Cardiovascular;  Laterality: Left;   REMOVAL OF PENILE PROSTHESIS N/A 06/14/2021   Procedure: Removal of THREE piece inflatable penile prosthesis;  Surgeon: Lucas Mallow, MD;  Location: [facility];  Service: Urology;  Laterality: N/A;   SHOULDER ARTHROSCOPY     SPINE SURGERY     TOE AMPUTATION Left    TONSILLECTOMY     TOTAL KNEE ARTHROPLASTY  07/2002   RIGHT KNEE ; SURGEON  DR. [name] ALSO HAD ARTHROSCOPIC RIGHT KNEE IN  10/2001   TOTAL KNEE ARTHROPLASTY     ULNAR NERVE TRANSPOSITION Right 10/21/2013   Procedure: RIGHT ELBOW  ULNAR NERVE DECOMPRESSION;  Surgeon: Wynonia Sours, MD;  Location: [facility];  Service: Orthopedics;  Laterality: Right;    FAMILY HISTORY Family History  Problem Relation Age of Onset   Heart disease Father        Before age 88-  CAD, BPG   Diabetes Father        Amputation   Cancer Father        PROSTATE   Hyperlipidemia Father    Hypertension Father    Heart attack Father        Triple BPG   Varicose Veins Father    Cancer Sister        Breast   Hyperlipidemia Sister    Hypertension Sister    Heart attack Brother    Colon cancer  Brother    Diabetes Brother    Heart disease Brother 68       A-Fib. Before age 78   Hyperlipidemia Brother    Hypertension Brother    Hypertension Son    Arthritis Other        GRANDMOTHER   Hypertension Other        OTHER FAMILY MEMBERS    SOCIAL HISTORY Social History   Tobacco Use   Smoking status: Former    Packs/day: 2.00    Years: 35.00    Total pack years: 70.00    Types: Cigarettes    Quit date: 10/28/2011    Years since quitting: 10.5   Smokeless tobacco: Never  Vaping Use   Vaping Use: Some days   Substances:  Nicotine  Substance Use Topics   Alcohol use: Not Currently    Comment: "not in a long time"   Drug use: Never         OPHTHALMIC EXAM:  Base Eye Exam     Visual Acuity (ETDRS)       Right Left   Dist [place] 20/200 20/20 -1   Dist ph [place] NI          Tonometry (Tonopen, 2:52 PM)       Right Left   Pressure 15 16         Pupils       Pupils APD   Right PERRL None   Left PERRL None         Visual Fields       Left Right    Full Full         Neuro/Psych     Oriented x3: Yes   Mood/Affect: Normal         Dilation     Right eye: 2.5% Phenylephrine, 1.0% Mydriacyl @ 2:52 PM           Slit Lamp and Fundus Exam     External Exam       Right Left   External Normal Normal         Slit Lamp Exam       Right Left   Lids/Lashes Normal Normal   Conjunctiva/Sclera White and quiet White and quiet   Cornea Clear Clear   Anterior Chamber Deep and quiet Deep and quiet   Iris Round and reactive Round and reactive   Lens Posterior chamber intraocular lens Posterior chamber intraocular lens   Anterior Vitreous Normal Normal         Fundus Exam       Right Left   Posterior Vitreous Finely dispersed hemorrhage.    Disc Normal    C/D Ratio 0.4    Macula Exudates, Microaneurysms, no apparent CSME    Vessels NPDR severe, with numerous dot blot hemorrhages, VCABS, Irma.  Some evidence of incomplete central retinal vein occlusion    Periphery good PRP nearly 360 yet large dot blot hemorrhages are noted temporally with some room anteriorly lesion             IMAGING AND PROCEDURES  Imaging and Procedures for 05/07/22  OCT, Retina - OU - Both Eyes       Right Eye Quality was good. Scan locations included subfoveal. Central Foveal Thickness: 208. Progression has improved. Findings include abnormal foveal contour, cystoid macular edema.   Left Eye Quality was good. Scan locations included subfoveal. Central Foveal Thickness: 292. Progression  has been stable. Findings include normal foveal contour.  Notes OD now improved view through the vitreous hemorrhage as it is clearing post injection antivegF.  Will need repeat injection antivegF to clear the residual CSME OD, with some intrinsic loss of foveal macular architecture suggestive of retinal vascular occlusion and/or ischemic CRVO in the past.  Overall improved however, clearing media from hemorrhage.  OD, with much less subretinal fluid and intrinsic CME CSME as compared to April 2023 on antivegF medication repeat again today   OS normal      Intravitreal Injection, Pharmacologic Agent - OD - Right Eye       Time Out 05/07/2022. 3:32 PM. Confirmed correct patient, procedure, site, and patient consented.   Anesthesia Topical anesthesia was used. Anesthetic medications included Lidocaine 4%.   Procedure Preparation included 5% betadine to ocular surface, 10% betadine to eyelids, Ofloxacin . A 30 gauge needle was used.   Injection: 2 mg aflibercept 2 MG/0.05ML   Route: Intravitreal, Site: Right Eye   NDC: [id number], Lot: [id number], Expiration date: 12/10/2022, Waste: 0 mL   Post-op Post injection exam found visual acuity of at least counting fingers. The patient tolerated the procedure well. There were no complications. The patient received written and verbal post procedure care education. Post injection medications included ocuflox.              ASSESSMENT/PLAN:  Vitreous hemorrhage of right eye (HCC) OD, vastly improved vitreous hemorrhage after commencement of  treatment of CSME with antivegF.  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 Diabetic macular edema of right eye with proliferative retinopathy associated with type 2 diabetes mellitus (HCC)  E11.3511 OCT, Retina - OU - Both Eyes    Intravitreal Injection, Pharmacologic  Agent - OD - Right Eye    aflibercept (EYLEA) SOLN 2 mg    2. Vitreous hemorrhage of right eye ([place])  H43.11     3. 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  OD overall improving condition with clearance of vitreous hemorrhage and stabilization of CSME on antivegF.  Repeat Eylea today and reevaluate next in 8 weeks.  Likely injection right at that time  2.  If vitreous hemorrhage does not clear further, could consider vitrectomy PRP right eye  3.  Ophthalmic Meds Ordered this visit:  Meds ordered this encounter  Medications   aflibercept (EYLEA) SOLN 2 mg       Return in about 8 weeks (around 07/02/2022) for DILATE OU, COLOR FP, EYLEA OCT, OD.  There are no Patient Instructions on file for this visit.   Explained the diagnoses, plan, and follow up with the patient and they expressed understanding.  Patient expressed understanding of the importance of proper follow up care.   Clent Demark Vihaan Gloss M.D. Diseases & Surgery of the Retina and Vitreous Retina & Diabetic [place] 05/07/22     Abbreviations: M myopia (nearsighted); A astigmatism; H hyperopia (farsighted); P presbyopia; Mrx spectacle prescription;  CTL contact lenses; OD right eye; OS left eye; OU both eyes  XT exotropia; ET esotropia; PEK punctate epithelial keratitis; PEE punctate epithelial erosions; DES dry eye syndrome; MGD meibomian gland dysfunction; ATs artificial tears; PFAT's preservative free artificial tears; [place] nuclear sclerotic cataract; PSC posterior subcapsular cataract; ERM epi-retinal membrane; PVD posterior vitreous detachment; RD retinal detachment; DM diabetes mellitus;  DR diabetic retinopathy; NPDR non-proliferative diabetic retinopathy; PDR proliferative diabetic retinopathy; CSME clinically significant macular edema; DME diabetic macular edema; dbh dot blot hemorrhages; CWS cotton wool spot; POAG primary open  angle glaucoma; C/D cup-to-disc ratio; HVF humphrey visual field; GVF goldmann visual field; OCT optical coherence tomography; IOP intraocular pressure; BRVO Branch retinal vein occlusion; CRVO central retinal vein occlusion; CRAO central retinal artery occlusion; BRAO branch retinal artery occlusion; RT retinal tear; SB scleral buckle; PPV pars plana vitrectomy; VH Vitreous hemorrhage; PRP panretinal laser photocoagulation; IVK intravitreal kenalog; VMT vitreomacular traction; MH Macular hole;  NVD neovascularization of the disc; NVE neovascularization elsewhere; AREDS age related eye disease study; ARMD age related macular degeneration; POAG primary open angle glaucoma; EBMD epithelial/anterior basement membrane dystrophy; ACIOL anterior chamber intraocular lens; IOL intraocular lens; PCIOL posterior chamber intraocular lens; Phaco/IOL phacoemulsification with intraocular lens placement; [place] photorefractive keratectomy; LASIK laser assisted in situ keratomileusis; HTN hypertension; DM diabetes mellitus; COPD chronic obstructive pulmonary disease

## 2022-05-07 NOTE — Assessment & Plan Note (Signed)
OD, vastly improved vitreous hemorrhage after commencement of  treatment of CSME with antivegF.

## 2022-05-07 NOTE — Assessment & Plan Note (Signed)
OS stable over time will observe

## 2022-05-08 DIAGNOSIS — N2581 Secondary hyperparathyroidism of renal origin: Secondary | ICD-10-CM | POA: Diagnosis not present

## 2022-05-08 DIAGNOSIS — E875 Hyperkalemia: Secondary | ICD-10-CM | POA: Diagnosis not present

## 2022-05-08 DIAGNOSIS — N186 End stage renal disease: Secondary | ICD-10-CM | POA: Diagnosis not present

## 2022-05-08 DIAGNOSIS — Z992 Dependence on renal dialysis: Secondary | ICD-10-CM | POA: Diagnosis not present

## 2022-05-10 DIAGNOSIS — E1122 Type 2 diabetes mellitus with diabetic chronic kidney disease: Secondary | ICD-10-CM | POA: Diagnosis not present

## 2022-05-10 DIAGNOSIS — Z992 Dependence on renal dialysis: Secondary | ICD-10-CM | POA: Diagnosis not present

## 2022-05-10 DIAGNOSIS — N186 End stage renal disease: Secondary | ICD-10-CM | POA: Diagnosis not present

## 2022-05-10 DIAGNOSIS — N2581 Secondary hyperparathyroidism of renal origin: Secondary | ICD-10-CM | POA: Diagnosis not present

## 2022-05-10 DIAGNOSIS — E875 Hyperkalemia: Secondary | ICD-10-CM | POA: Diagnosis not present

## 2022-05-12 DIAGNOSIS — N186 End stage renal disease: Secondary | ICD-10-CM | POA: Diagnosis not present

## 2022-05-12 DIAGNOSIS — Z992 Dependence on renal dialysis: Secondary | ICD-10-CM | POA: Diagnosis not present

## 2022-05-12 DIAGNOSIS — N2581 Secondary hyperparathyroidism of renal origin: Secondary | ICD-10-CM | POA: Diagnosis not present

## 2022-05-15 DIAGNOSIS — N186 End stage renal disease: Secondary | ICD-10-CM | POA: Diagnosis not present

## 2022-05-15 DIAGNOSIS — D631 Anemia in chronic kidney disease: Secondary | ICD-10-CM | POA: Diagnosis not present

## 2022-05-15 DIAGNOSIS — N2581 Secondary hyperparathyroidism of renal origin: Secondary | ICD-10-CM | POA: Diagnosis not present

## 2022-05-15 DIAGNOSIS — Z992 Dependence on renal dialysis: Secondary | ICD-10-CM | POA: Diagnosis not present

## 2022-05-17 DIAGNOSIS — N186 End stage renal disease: Secondary | ICD-10-CM | POA: Diagnosis not present

## 2022-05-17 DIAGNOSIS — Z992 Dependence on renal dialysis: Secondary | ICD-10-CM | POA: Diagnosis not present

## 2022-05-17 DIAGNOSIS — D631 Anemia in chronic kidney disease: Secondary | ICD-10-CM | POA: Diagnosis not present

## 2022-05-17 DIAGNOSIS — N2581 Secondary hyperparathyroidism of renal origin: Secondary | ICD-10-CM | POA: Diagnosis not present

## 2022-05-19 DIAGNOSIS — N2581 Secondary hyperparathyroidism of renal origin: Secondary | ICD-10-CM | POA: Diagnosis not present

## 2022-05-19 DIAGNOSIS — N186 End stage renal disease: Secondary | ICD-10-CM | POA: Diagnosis not present

## 2022-05-19 DIAGNOSIS — Z992 Dependence on renal dialysis: Secondary | ICD-10-CM | POA: Diagnosis not present

## 2022-05-19 DIAGNOSIS — D631 Anemia in chronic kidney disease: Secondary | ICD-10-CM | POA: Diagnosis not present

## 2022-05-21 ENCOUNTER — Ambulatory Visit: Payer: Medicare HMO | Admitting: Orthopedic Surgery

## 2022-05-21 DIAGNOSIS — I87332 Chronic venous hypertension (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: Secondary | ICD-10-CM | POA: Diagnosis not present

## 2022-05-21 DIAGNOSIS — L97521 Non-pressure chronic ulcer of other part of left foot limited to breakdown of skin: Secondary | ICD-10-CM

## 2022-05-21 MED ORDER — NITROGLYCERIN 0.2 MG/HR TD PT24: 0.2000 mg | MEDICATED_PATCH | Freq: Every day | TRANSDERMAL | 12 refills | Status: DC

## 2022-05-22 ENCOUNTER — Encounter: Payer: Self-pay | Admitting: Orthopedic Surgery

## 2022-05-22 DIAGNOSIS — N186 End stage renal disease: Secondary | ICD-10-CM | POA: Diagnosis not present

## 2022-05-22 DIAGNOSIS — N2581 Secondary hyperparathyroidism of renal origin: Secondary | ICD-10-CM | POA: Diagnosis not present

## 2022-05-22 DIAGNOSIS — D509 Iron deficiency anemia, unspecified: Secondary | ICD-10-CM | POA: Diagnosis not present

## 2022-05-22 DIAGNOSIS — Z992 Dependence on renal dialysis: Secondary | ICD-10-CM | POA: Diagnosis not present

## 2022-05-22 NOTE — Progress Notes (Signed)
Office Visit Note   Patient: [name]           Date of Birth: 28-Feb-1956           MRN: [id number] Visit Date: 05/21/2022              Requested by: [name], PA-C [address] [place],  [place] [address] PCP: [name], PA-C  Chief Complaint  Patient presents with   Left Foot - Follow-up      HPI: Patient is a 66 year old gentleman who presents in follow-up for chronic venous insufficiency ulcer lateral aspect left foot.  Currently having home health nursing with multilayer compression wraps 2 times a week with silver alginate to the wound.  Patient has been on doxycycline and using a nitroglycerin patch to help microcirculation.  Assessment & Plan: Visit Diagnoses:  1. Chronic venous hypertension (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 (HCC)   2. Ischemic ulcer of left foot, limited to breakdown of skin ([place])     Plan: Patient continues to show slow steady improvement a refill prescription was provided for the nitroglycerin.  Continue with current wound care silver cell and Dynaflex applied today.  Follow-Up Instructions: Return in about 4 weeks (around 06/18/2022).   Ortho Exam  Patient is alert, oriented, no adenopathy, well-dressed, normal affect, normal respiratory effort. Examination patient has decreased swelling in the left lower extremity from the compression wrap.  The lateral foot ulcer is 25 mm in diameter 1 mm deep with healthy granulation tissue there is no odor no drainage no cellulitis no tunneling no signs of infection.  Imaging: No results found. No images are attached to the encounter.  Labs: Lab Results  Component Value Date   HGBA1C 7.6 (H) 01/11/2022   HGBA1C 7.9 (H) 06/14/2021   HGBA1C 5.7 (H) 09/16/2018   ESRSEDRATE 40 (H) 01/13/2022   ESRSEDRATE 55 (H) 01/11/2022   ESRSEDRATE 40 (H) 09/03/2021   CRP 3.6 (H) 01/13/2022   CRP 4.2 (H) 01/11/2022   CRP 2.7 (H) 09/07/2021   REPTSTATUS 01/16/2022 FINAL  01/11/2022   GRAMSTAIN  06/14/2021    NO ORGANISMS SEEN SQUAMOUS EPITHELIAL CELLS PRESENT ABUNDANT WBC PRESENT,BOTH PMN AND MONONUCLEAR MODERATE GRAM POSITIVE COCCI    CULT  01/11/2022    NO GROWTH 5 DAYS Performed at [facility], [place] [address] [place], [place] [address]    LABORGA ENTEROCOCCUS FAECALIS 06/14/2021     Lab Results  Component Value Date   ALBUMIN 3.1 (L) 02/14/2022   ALBUMIN 2.6 (L) 01/14/2022   ALBUMIN 3.0 (L) 01/13/2022   PREALBUMIN 19.0 05/04/2016   PREALBUMIN 25.0 12/16/2015    Lab Results  Component Value Date   MG 1.8 01/14/2022   MG 1.9 05/24/2018   MG 2.1 05/23/2018   No results found for: "VD25OH"  Lab Results  Component Value Date   PREALBUMIN 19.0 05/04/2016   PREALBUMIN 25.0 12/16/2015      Latest Ref Rng & Units 02/14/2022   12:35 PM 01/14/2022    1:11 AM 01/13/2022    6:33 AM  CBC EXTENDED  WBC 4.0 - 10.5 K/uL 5.0  4.4  4.4   RBC 4.22 - 5.81 MIL/uL 3.47  2.96  3.53   Hemoglobin 13.0 - 17.0 g/dL 10.8  9.2  10.6   HCT 39.0 - 52.0 % 32.1  26.2  31.7   Platelets 150 - 400 K/uL 142  165  165   NEUT# 1.7 - 7.7 K/uL  3.3     Lymph# 0.7 - 4.0 K/uL 1.0        There is no height or weight on file to calculate BMI.  Orders:  No orders of the defined types were placed in this encounter.  Meds ordered this encounter  Medications   nitroGLYCERIN (NITRODUR - DOSED IN MG/24 HR) 0.2 mg/hr patch    Sig: Place 1 patch (0.2 mg total) onto the skin daily.    Dispense:  30 patch    Refill:  12     Procedures: No procedures performed  Clinical Data: No additional findings.  ROS:  All other systems negative, except as noted in the HPI. Review of Systems  Objective: Vital Signs: There were no vitals taken for this visit.  Specialty Comments:  No specialty comments available.  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 ([place]) 11/27/2021   Vitreous hemorrhage of right eye ([place]) 16/06/9603   Acute metabolic encephalopathy 54/05/8118   COVID 09/02/2021   Sepsis ([place]) 09/02/2021   Severe sepsis with lactic acidosis ([place]) 06/14/2021   Infection associated with implanted penile prosthesis ([place]) 06/14/2021   ESRD on hemodialysis ([place]) 06/14/2021   Paroxysmal atrial fibrillation with RVR ([place]) 06/14/2021   Carotid stenosis 06/15/2020   Dyslipidemia 06/15/2020   Diabetic macular edema of right eye with proliferative retinopathy associated with type 2 diabetes mellitus ([place]) 01/18/2020   Severe nonproliferative diabetic retinopathy of left eye, with macular edema, associated with type 2 diabetes mellitus ([place]) 01/04/2020   Retinal hemorrhage of right eye 01/04/2020   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   Pseudoclaudication 11/15/2018   Essential hypertension 09/18/2018   S/P BKA (below knee amputation), right ([place]) 09/18/2018   Amputation of toe of left foot ([place]) 09/18/2018   Peripheral artery disease ([place]) 09/18/2018   S/P carotid endarterectomy 09/18/2018   OSA not tolerating CPAP 09/18/2018   Chronic back pain 09/18/2018   Status post reversal of ileostomy 05/21/2018   Normocytic anemia 02/14/2018   Intra-abdominal abscess ([place]) 11/04/2017   Chronic venous hypertension (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 ([place]) 12/11/2016   PAD (peripheral artery disease) ([place]) 11/12/2016   Unilateral primary osteoarthritis, left knee 10/11/2016   History of right below knee amputation ([place]) 07/12/2016   Infected diabetic left foot ulcer with chronic osteomyelitis    Diabetic foot  infection ([place]) 05/04/2016   Insulin dependent type 2 diabetes mellitus ([place]) 12/16/2015   Amputated toe ([place]) 10/21/2015   Leg edema, left 09/03/2015   CAD (dz of distal, mid and proximal RCA with implantation of 3 overlapping drug-eluting stent,) 09/03/2015   Chronic diastolic heart failure, NYHA class 2 ([place]) 09/03/2015   Angina pectoris associated with type 2 diabetes mellitus ([place]) 10/28/2014   Hyperlipidemia 08/25/2014   Limb pain 03/20/2013   DJD (degenerative joint disease) 09/25/2012   Migraine 09/25/2012   Neuropathy 09/25/2012   Restless legs syndrome (RLS) 09/25/2012   Chronic obstructive pulmonary disease, unspecified ([place]) 04/25/2012   Unknown cause of morbidity or mortality 04/25/2012   Chronic total occlusion of artery of the extremities ([place]) 04/08/2012   Onychomycosis  02/01/2012   Occlusion and stenosis of carotid artery without mention of cerebral infarction 06/12/2011   GERD (gastroesophageal reflux disease) 05/08/2011   Barrett's esophagus without dysplasia 05/08/2011   Former tobacco use 02/01/2011   Past Medical History:  Diagnosis Date   Carotid artery occlusion 11/10/10   LEFT CAROTID ENDARTERECTOMY   Chronic kidney disease    Complication of anesthesia    BP WENT UP AT [facility] "   COPD (chronic obstructive pulmonary disease) ([place])    pt denies this dx as of 06/01/20 - no inhaler    Diabetes mellitus without complication ([place])    Diverticulitis    Diverticulosis of colon (without mention of hemorrhage)    DJD (degenerative joint disease)    knees/hands/feet/back/neck   Fatty liver    Full dentures    GERD (gastroesophageal reflux disease)    H/O hiatal hernia    History of blood transfusion    with a past surical procedure per patient 06/01/20   Hyperlipidemia    Hypertension    Neuromuscular disorder ([place])    peripheral neuropathy   Non-pressure chronic ulcer of other part of left foot limited to breakdown of skin ([place]) 11/12/2016   Osteomyelitis (HCC)     left 5th metatarsal   PAD (peripheral artery disease) ([place])    Distal aortogram June 2012. Atherectomy left popliteal artery July 2012.    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 Before age 96   Hyperlipidemia Brother    Hypertension Brother    Hypertension Son    Arthritis Other        GRANDMOTHER   Hypertension Other        OTHER FAMILY MEMBERS    Past Surgical History:  Procedure Laterality Date   ABDOMINAL AORTOGRAM W/LOWER EXTREMITY N/A 06/23/2021   Procedure: ABDOMINAL AORTOGRAM W/LOWER EXTREMITY;  Surgeon: Nigel Mormon, MD;  Location: [facility];  Service: Cardiovascular;  Laterality: N/A;   AMPUTATION  11/05/2011   Procedure: AMPUTATION RAY;  Surgeon: Wylene Simmer, MD;  Location: [facility];  Service: Orthopedics;  Laterality: Right;  Amputation of Right 4&5th Toes   AMPUTATION Left 11/26/2012   Procedure: AMPUTATION RAY;  Surgeon: Wylene Simmer, MD;  Location: [facility];  Service: Orthopedics;  Laterality: Left;  fourth ray amputation   AMPUTATION Right 08/27/2014   Procedure: Transmetatarsal Amputation;  Surgeon: Newt Minion, MD;  Location: [facility];  Service: Orthopedics;  Laterality: Right;   AMPUTATION Right 01/14/2015   Procedure:  AMPUTATION BELOW KNEE;  Surgeon: Newt Minion, MD;  Location: [facility];  Service:  Orthopedics;  Laterality: Right;   AMPUTATION Left 10/21/2015   Procedure: Left Foot 5th Ray Amputation;  Surgeon: Newt Minion, MD;  Location: [facility];  Service: Orthopedics;  Laterality: Left;   ANTERIOR FUSION CERVICAL SPINE  02/06/06   C4-5, C5-6, C6-7; SURGEON DR. MAX COHEN   AV FISTULA PLACEMENT Left 06/02/2020   Procedure: ARTERIOVENOUS (AV) FISTULA CREATION LEFT;  Surgeon: Waynetta Sandy, MD;  Location: [facility];  Service: Vascular;  Laterality: Left;   BACK SURGERY     x 3   [place] Left 07/21/2020   Procedure: LEFT UPPER ARM ATERIOVENOUS SUPERFISTULALIZATION;  Surgeon: Waynetta Sandy, MD;  Location: [facility];  Service: Vascular;  Laterality: Left;   BELOW KNEE LEG AMPUTATION Right    CARDIAC CATHETERIZATION  10/31/04   2009   CAROTID ENDARTERECTOMY  11/10/10   CAROTID ENDARTERECTOMY Left 11/10/2010   Subtotal occlusion of left internal carotid artery with left hemispheric transient ischemic attacks.   CAROTID STENT     CARPAL TUNNEL RELEASE Right 10/21/2013   Procedure: RIGHT CARPAL TUNNEL RELEASE;  Surgeon: Wynonia Sours, MD;  Location: [facility];  Service: Orthopedics;  Laterality: Right;   CHOLECYSTECTOMY     COLON SURGERY     COLONOSCOPY     COLOSTOMY REVERSAL  05/21/2018   ileostomy reversal   CYSTOSCOPY WITH STENT PLACEMENT Bilateral 01/13/2018   Procedure: CYSTOSCOPY WITH BILATERAL URETERAL CATHETER PLACEMENT;  Surgeon: Ardis Hughs, MD;  Location: [facility];  Service: Urology;  Laterality: Bilateral;   ESOPHAGEAL MANOMETRY Bilateral 07/19/2014   Procedure: ESOPHAGEAL MANOMETRY (EM);  Surgeon: Jerene Bears, MD;  [facility];  Service: Gastroenterology;  Laterality: Bilateral;   EYE SURGERY Bilateral 2020   cataract   FEMORAL ARTERY STENT     x6   FINGER SURGERY     FOOT SURGERY  04/25/2016    EXCISION BASE 5TH METATARSAL AND PARTIAL CUBOID LEFT FOOT   HERNIA REPAIR     LEFT INGUINAL AND UMBILICAL REPAIRS    HERNIA REPAIR     I & D EXTREMITY Left 04/25/2016   Procedure: EXCISION BASE 5TH METATARSAL AND PARTIAL CUBOID LEFT FOOT;  Surgeon: Newt Minion, MD;  Location: [facility];  Service: Orthopedics;  Laterality: Left;   ILEOSTOMY  01/13/2018   Procedure: ILEOSTOMY;  Surgeon: Clovis Riley, MD;  [facility];  Service: General;;   ILEOSTOMY CLOSURE N/A 05/21/2018   Procedure: ILEOSTOMY REVERSAL ERAS PATHWAY;  Surgeon: Clovis Riley, MD;  Location: [facility];  Service: General;  Laterality: N/A;   IR RADIOLOGIST EVAL & MGMT  11/19/2017   IR RADIOLOGIST EVAL & MGMT  12/03/2017   IR RADIOLOGIST EVAL & MGMT  12/18/2017   JOINT REPLACEMENT Right 2001   Total knee   LAMINECTOMY     X 3 LUMBAR AND X 2 CERVICAL SPINE OPERATIONS   LAPAROSCOPIC CHOLECYSTECTOMY W/ CHOLANGIOGRAPHY  11/09/04   SURGEON DR[employer] CATH AND CORONARY ANGIOGRAPHY N/A 09/16/2018   Procedure: LEFT HEART CATH AND CORONARY ANGIOGRAPHY;  Surgeon: Nigel Mormon, MD;  Location: [facility];  Service: Cardiovascular;  Laterality: N/A;   LEFT HEART CATHETERIZATION WITH CORONARY ANGIOGRAM N/A 10/29/2014   Procedure: LEFT HEART CATHETERIZATION WITH CORONARY ANGIOGRAM;  Surgeon: Laverda Page, MD;  Location: [facility];  Service: Cardiovascular;  Laterality: N/A;  LIGATION OF COMPETING BRANCHES OF ARTERIOVENOUS FISTULA Left 07/21/2020   Procedure: LIGATION OF COMPETING BRANCHES OF LEFT UPPER ARM ARTERIOVENOUS FISTULA;  Surgeon: Waynetta Sandy, MD;  Location: [facility];  Service: Vascular;  Laterality: Left;   LOWER EXTREMITY ANGIOGRAM N/A 03/19/2012   Procedure: LOWER EXTREMITY ANGIOGRAM;  Surgeon: Burnell Blanks, MD;  Location: [facility];  Service: Cardiovascular;  Laterality: N/A;   LOWER EXTREMITY ANGIOGRAPHY N/A 06/20/2021   Procedure: LOWER EXTREMITY ANGIOGRAPHY;  Surgeon: Nigel Mormon, MD;  Location: [facility];  Service: Cardiovascular;  Laterality: N/A;   NECK SURGERY      PARTIAL COLECTOMY N/A 01/13/2018   Procedure: LAPAROSCOPIC ASSISTED   SIGMOID COLECTOMY ILEOSTOMY;  Surgeon: Clovis Riley, MD;  [facility];  Service: General;  Laterality: N/A;   PENILE PROSTHESIS IMPLANT  08/14/05   INFRAPUBIC INSERTION OF INFLATABLE PENILE PROSTHESIS; SURGEON DR. Amalia Hailey   PENILE PROSTHESIS IMPLANT     PERCUTANEOUS CORONARY STENT INTERVENTION (PCI-S) Right 10/29/2014   Procedure: PERCUTANEOUS CORONARY STENT INTERVENTION (PCI-S);  Surgeon: Laverda Page, MD;  Location: [facility];  Service: Cardiovascular;  Laterality: Right;   PERIPHERAL VASCULAR INTERVENTION Left 06/23/2021   Procedure: PERIPHERAL VASCULAR INTERVENTION;  Surgeon: Nigel Mormon, MD;  Location: [facility];  Service: Cardiovascular;  Laterality: Left;   REMOVAL OF PENILE PROSTHESIS N/A 06/14/2021   Procedure: Removal of THREE piece inflatable penile prosthesis;  Surgeon: Lucas Mallow, MD;  Location: [facility];  Service: Urology;  Laterality: N/A;   SHOULDER ARTHROSCOPY     SPINE SURGERY     TOE AMPUTATION Left    TONSILLECTOMY     TOTAL KNEE ARTHROPLASTY  07/2002   RIGHT KNEE ; SURGEON  DR. [name] ALSO HAD ARTHROSCOPIC RIGHT KNEE IN  10/2001   TOTAL KNEE ARTHROPLASTY     ULNAR NERVE TRANSPOSITION Right 10/21/2013   Procedure: RIGHT ELBOW  ULNAR NERVE DECOMPRESSION;  Surgeon: Wynonia Sours, MD;  Location: [facility];  Service: Orthopedics;  Laterality: Right;   Social History   Occupational History   Occupation: Magazine features editor: UNEMPLOYED  Tobacco Use   Smoking status: Former    Packs/day: 2.00    Years: 35.00    Total pack years: 70.00    Types: Cigarettes    Quit date: 10/28/2011    Years since quitting: 10.5   Smokeless tobacco: Never  Vaping Use   Vaping Use: Some days   Substances: Nicotine  Substance and Sexual Activity   Alcohol use: Not Currently    Comment: "not in a long time"   Drug use: Never   Sexual activity: Yes    Birth  control/protection: Implant    Comment: penile implant

## 2022-05-24 DIAGNOSIS — N186 End stage renal disease: Secondary | ICD-10-CM | POA: Diagnosis not present

## 2022-05-24 DIAGNOSIS — D509 Iron deficiency anemia, unspecified: Secondary | ICD-10-CM | POA: Diagnosis not present

## 2022-05-24 DIAGNOSIS — Z992 Dependence on renal dialysis: Secondary | ICD-10-CM | POA: Diagnosis not present

## 2022-05-24 DIAGNOSIS — N2581 Secondary hyperparathyroidism of renal origin: Secondary | ICD-10-CM | POA: Diagnosis not present

## 2022-05-25 ENCOUNTER — Other Ambulatory Visit: Payer: Self-pay | Admitting: Orthopedic Surgery

## 2022-05-26 DIAGNOSIS — N186 End stage renal disease: Secondary | ICD-10-CM | POA: Diagnosis not present

## 2022-05-26 DIAGNOSIS — Z992 Dependence on renal dialysis: Secondary | ICD-10-CM | POA: Diagnosis not present

## 2022-05-26 DIAGNOSIS — D509 Iron deficiency anemia, unspecified: Secondary | ICD-10-CM | POA: Diagnosis not present

## 2022-05-26 DIAGNOSIS — N2581 Secondary hyperparathyroidism of renal origin: Secondary | ICD-10-CM | POA: Diagnosis not present

## 2022-05-29 DIAGNOSIS — Z992 Dependence on renal dialysis: Secondary | ICD-10-CM | POA: Diagnosis not present

## 2022-05-29 DIAGNOSIS — D509 Iron deficiency anemia, unspecified: Secondary | ICD-10-CM | POA: Diagnosis not present

## 2022-05-29 DIAGNOSIS — N2581 Secondary hyperparathyroidism of renal origin: Secondary | ICD-10-CM | POA: Diagnosis not present

## 2022-05-29 DIAGNOSIS — N186 End stage renal disease: Secondary | ICD-10-CM | POA: Diagnosis not present

## 2022-05-31 DIAGNOSIS — Z992 Dependence on renal dialysis: Secondary | ICD-10-CM | POA: Diagnosis not present

## 2022-05-31 DIAGNOSIS — N186 End stage renal disease: Secondary | ICD-10-CM | POA: Diagnosis not present

## 2022-05-31 DIAGNOSIS — D509 Iron deficiency anemia, unspecified: Secondary | ICD-10-CM | POA: Diagnosis not present

## 2022-05-31 DIAGNOSIS — N2581 Secondary hyperparathyroidism of renal origin: Secondary | ICD-10-CM | POA: Diagnosis not present

## 2022-06-02 DIAGNOSIS — Z992 Dependence on renal dialysis: Secondary | ICD-10-CM | POA: Diagnosis not present

## 2022-06-02 DIAGNOSIS — N2581 Secondary hyperparathyroidism of renal origin: Secondary | ICD-10-CM | POA: Diagnosis not present

## 2022-06-02 DIAGNOSIS — N186 End stage renal disease: Secondary | ICD-10-CM | POA: Diagnosis not present

## 2022-06-02 DIAGNOSIS — D509 Iron deficiency anemia, unspecified: Secondary | ICD-10-CM | POA: Diagnosis not present

## 2022-06-05 DIAGNOSIS — D509 Iron deficiency anemia, unspecified: Secondary | ICD-10-CM | POA: Diagnosis not present

## 2022-06-05 DIAGNOSIS — N2581 Secondary hyperparathyroidism of renal origin: Secondary | ICD-10-CM | POA: Diagnosis not present

## 2022-06-05 DIAGNOSIS — E875 Hyperkalemia: Secondary | ICD-10-CM | POA: Diagnosis not present

## 2022-06-05 DIAGNOSIS — Z992 Dependence on renal dialysis: Secondary | ICD-10-CM | POA: Diagnosis not present

## 2022-06-05 DIAGNOSIS — N186 End stage renal disease: Secondary | ICD-10-CM | POA: Diagnosis not present

## 2022-06-07 DIAGNOSIS — D509 Iron deficiency anemia, unspecified: Secondary | ICD-10-CM | POA: Diagnosis not present

## 2022-06-07 DIAGNOSIS — N186 End stage renal disease: Secondary | ICD-10-CM | POA: Diagnosis not present

## 2022-06-07 DIAGNOSIS — E875 Hyperkalemia: Secondary | ICD-10-CM | POA: Diagnosis not present

## 2022-06-07 DIAGNOSIS — Z992 Dependence on renal dialysis: Secondary | ICD-10-CM | POA: Diagnosis not present

## 2022-06-07 DIAGNOSIS — N2581 Secondary hyperparathyroidism of renal origin: Secondary | ICD-10-CM | POA: Diagnosis not present

## 2022-06-09 DIAGNOSIS — Z992 Dependence on renal dialysis: Secondary | ICD-10-CM | POA: Diagnosis not present

## 2022-06-09 DIAGNOSIS — N186 End stage renal disease: Secondary | ICD-10-CM | POA: Diagnosis not present

## 2022-06-09 DIAGNOSIS — E875 Hyperkalemia: Secondary | ICD-10-CM | POA: Diagnosis not present

## 2022-06-09 DIAGNOSIS — D509 Iron deficiency anemia, unspecified: Secondary | ICD-10-CM | POA: Diagnosis not present

## 2022-06-09 DIAGNOSIS — N2581 Secondary hyperparathyroidism of renal origin: Secondary | ICD-10-CM | POA: Diagnosis not present

## 2022-06-09 DIAGNOSIS — E1122 Type 2 diabetes mellitus with diabetic chronic kidney disease: Secondary | ICD-10-CM | POA: Diagnosis not present

## 2022-06-11 ENCOUNTER — Telehealth: Payer: Self-pay | Admitting: Orthopedic Surgery

## 2022-06-11 NOTE — Telephone Encounter (Signed)
Called and sw Cary to advise verbal ok to continue wound care for pt.

## 2022-06-11 NOTE — Telephone Encounter (Signed)
Cary Investment banker, corporate) from [facility] called for orders of continued wound care. Please cal Cary at [phone].

## 2022-06-12 DIAGNOSIS — Z992 Dependence on renal dialysis: Secondary | ICD-10-CM | POA: Diagnosis not present

## 2022-06-12 DIAGNOSIS — N186 End stage renal disease: Secondary | ICD-10-CM | POA: Diagnosis not present

## 2022-06-12 DIAGNOSIS — D509 Iron deficiency anemia, unspecified: Secondary | ICD-10-CM | POA: Diagnosis not present

## 2022-06-12 DIAGNOSIS — N2581 Secondary hyperparathyroidism of renal origin: Secondary | ICD-10-CM | POA: Diagnosis not present

## 2022-06-12 DIAGNOSIS — D631 Anemia in chronic kidney disease: Secondary | ICD-10-CM | POA: Diagnosis not present

## 2022-06-13 ENCOUNTER — Encounter: Payer: Self-pay | Admitting: Gastroenterology

## 2022-06-14 DIAGNOSIS — D631 Anemia in chronic kidney disease: Secondary | ICD-10-CM | POA: Diagnosis not present

## 2022-06-14 DIAGNOSIS — D509 Iron deficiency anemia, unspecified: Secondary | ICD-10-CM | POA: Diagnosis not present

## 2022-06-14 DIAGNOSIS — Z992 Dependence on renal dialysis: Secondary | ICD-10-CM | POA: Diagnosis not present

## 2022-06-14 DIAGNOSIS — N186 End stage renal disease: Secondary | ICD-10-CM | POA: Diagnosis not present

## 2022-06-14 DIAGNOSIS — N2581 Secondary hyperparathyroidism of renal origin: Secondary | ICD-10-CM | POA: Diagnosis not present

## 2022-06-16 DIAGNOSIS — D509 Iron deficiency anemia, unspecified: Secondary | ICD-10-CM | POA: Diagnosis not present

## 2022-06-16 DIAGNOSIS — Z992 Dependence on renal dialysis: Secondary | ICD-10-CM | POA: Diagnosis not present

## 2022-06-16 DIAGNOSIS — N186 End stage renal disease: Secondary | ICD-10-CM | POA: Diagnosis not present

## 2022-06-16 DIAGNOSIS — D631 Anemia in chronic kidney disease: Secondary | ICD-10-CM | POA: Diagnosis not present

## 2022-06-16 DIAGNOSIS — N2581 Secondary hyperparathyroidism of renal origin: Secondary | ICD-10-CM | POA: Diagnosis not present

## 2022-06-18 ENCOUNTER — Ambulatory Visit (INDEPENDENT_AMBULATORY_CARE_PROVIDER_SITE_OTHER): Payer: Medicare HMO | Admitting: Orthopedic Surgery

## 2022-06-18 ENCOUNTER — Encounter: Payer: Self-pay | Admitting: Orthopedic Surgery

## 2022-06-18 DIAGNOSIS — E1140 Type 2 diabetes mellitus with diabetic neuropathy, unspecified: Secondary | ICD-10-CM | POA: Diagnosis not present

## 2022-06-18 DIAGNOSIS — I87332 Chronic venous hypertension (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: Secondary | ICD-10-CM

## 2022-06-18 DIAGNOSIS — L97521 Non-pressure chronic ulcer of other part of left foot limited to breakdown of skin: Secondary | ICD-10-CM

## 2022-06-18 NOTE — Progress Notes (Signed)
Office Visit Note   Patient: [name]           Date of Birth: 05/07/1956           MRN: [id number] Visit Date: 06/18/2022              Requested by: [name], PA-C [address] [place],  [place] [address] PCP: [name], PA-C  Chief Complaint  Patient presents with   Left Foot - Wound Check      HPI: Patient is a 66 year old gentleman who presents in follow-up for chronic ulcer plantar lateral aspect the left foot.  Patient has had home health nursing performed dressing changes with silver alginate and he has been using the nitroglycerin patch.  He is in dialysis.  Assessment & Plan: Visit Diagnoses:  1. Chronic venous hypertension (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 (HCC)   2. Ischemic ulcer of left foot, limited to breakdown of skin ([place])     Plan: Recommended continuing to wrap the leg with compression above the knee continue with his nitroglycerin patch and dressing changes.  Follow-Up Instructions: Return in about 4 weeks (around 07/16/2022).   Ortho Exam  Patient is alert, oriented, no adenopathy, well-dressed, normal affect, normal respiratory effort. Examination the lateral base of the left foot has a 2 cm diameter wound with healthy granulation tissue.  There is no cellulitis no drainage no odor no exposed bone or tendon.  Patient does have significant swelling in the proximal calf which measures 56 cm in circumference his ankle is 29 cm in circumference.  Imaging: No results found. No images are attached to the encounter.  Labs: Lab Results  Component Value Date   HGBA1C 7.6 (H) 01/11/2022   HGBA1C 7.9 (H) 06/14/2021   HGBA1C 5.7 (H) 09/16/2018   ESRSEDRATE 40 (H) 01/13/2022   ESRSEDRATE 55 (H) 01/11/2022   ESRSEDRATE 40 (H) 09/03/2021   CRP 3.6 (H) 01/13/2022   CRP 4.2 (H) 01/11/2022   CRP 2.7 (H) 09/07/2021   REPTSTATUS 01/16/2022 FINAL 01/11/2022   GRAMSTAIN  06/14/2021    NO ORGANISMS SEEN SQUAMOUS  EPITHELIAL CELLS PRESENT ABUNDANT WBC PRESENT,BOTH PMN AND MONONUCLEAR MODERATE GRAM POSITIVE COCCI    CULT  01/11/2022    NO GROWTH 5 DAYS Performed at [facility], [place] [address] [place], [place] [address]    LABORGA ENTEROCOCCUS FAECALIS 06/14/2021     Lab Results  Component Value Date   ALBUMIN 3.1 (L) 02/14/2022   ALBUMIN 2.6 (L) 01/14/2022   ALBUMIN 3.0 (L) 01/13/2022   PREALBUMIN 19.0 05/04/2016   PREALBUMIN 25.0 12/16/2015    Lab Results  Component Value Date   MG 1.8 01/14/2022   MG 1.9 05/24/2018   MG 2.1 05/23/2018   No results found for: "VD25OH"  Lab Results  Component Value Date   PREALBUMIN 19.0 05/04/2016   PREALBUMIN 25.0 12/16/2015      Latest Ref Rng & Units 02/14/2022   12:35 PM 01/14/2022    1:11 AM 01/13/2022    6:33 AM  CBC EXTENDED  WBC 4.0 - 10.5 K/uL 5.0  4.4  4.4   RBC 4.22 - 5.81 MIL/uL 3.47  2.96  3.53   Hemoglobin 13.0 - 17.0 g/dL 10.8  9.2  10.6   HCT 39.0 - 52.0 % 32.1  26.2  31.7   Platelets 150 - 400 K/uL 142  165  165   NEUT# 1.7 - 7.7 K/uL 3.3  Lymph# 0.7 - 4.0 K/uL 1.0        There is no height or weight on file to calculate BMI.  Orders:  No orders of the defined types were placed in this encounter.  No orders of the defined types were placed in this encounter.    Procedures: No procedures performed  Clinical Data: No additional findings.  ROS:  All other systems negative, except as noted in the HPI. Review of Systems  Objective: Vital Signs: There were no vitals taken for this visit.  Specialty Comments:  No specialty comments available.  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 ([place]) 11/27/2021   Vitreous hemorrhage of right eye ([place]) 01/77/9390   Acute metabolic encephalopathy 30/05/2329   COVID 09/02/2021   Sepsis ([place]) 09/02/2021   Severe sepsis with lactic acidosis ([place]) 06/14/2021   Infection associated with implanted penile prosthesis ([place]) 06/14/2021   ESRD on hemodialysis ([place]) 06/14/2021   Paroxysmal atrial fibrillation with RVR ([place]) 06/14/2021   Carotid stenosis 06/15/2020   Dyslipidemia 06/15/2020   Diabetic macular edema of right eye with proliferative retinopathy associated with type 2 diabetes mellitus ([place]) 01/18/2020   Severe nonproliferative diabetic retinopathy of left eye, with macular edema, associated with type 2 diabetes mellitus ([place]) 01/04/2020   Retinal hemorrhage of right eye 01/04/2020   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   Pseudoclaudication 11/15/2018   Essential hypertension 09/18/2018   S/P BKA (below knee amputation), right ([place]) 09/18/2018   Amputation of toe of left foot ([place]) 09/18/2018   Peripheral artery disease ([place]) 09/18/2018   S/P carotid endarterectomy 09/18/2018   OSA not tolerating CPAP 09/18/2018   Chronic back pain 09/18/2018   Status post reversal of ileostomy 05/21/2018   Normocytic anemia 02/14/2018   Intra-abdominal abscess ([place]) 11/04/2017   Chronic venous hypertension (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 ([place]) 12/11/2016   PAD (peripheral artery disease) ([place]) 11/12/2016   Unilateral primary osteoarthritis, left knee 10/11/2016   History of right below knee amputation ([place]) 07/12/2016   Infected diabetic left foot ulcer with chronic osteomyelitis    Diabetic foot infection ([place]) 05/04/2016   Insulin dependent type 2 diabetes mellitus ([place]) 12/16/2015   Amputated toe ([place]) 10/21/2015   Leg edema, left 09/03/2015   CAD (dz of distal, mid and proximal RCA with implantation  of 3 overlapping drug-eluting stent,) 09/03/2015   Chronic diastolic heart failure, NYHA class 2 ([place]) 09/03/2015   Angina pectoris associated with type 2 diabetes mellitus ([place]) 10/28/2014   Hyperlipidemia 08/25/2014   Limb pain 03/20/2013   DJD (degenerative joint disease) 09/25/2012   Migraine 09/25/2012   Neuropathy 09/25/2012   Restless legs syndrome (RLS) 09/25/2012   Chronic obstructive pulmonary disease, unspecified ([place]) 04/25/2012   Unknown cause of morbidity or mortality 04/25/2012   Chronic total occlusion of artery of the extremities ([place]) 04/08/2012   Onychomycosis 02/01/2012   Occlusion and stenosis of carotid artery without mention of cerebral infarction 06/12/2011   GERD (gastroesophageal reflux disease) 05/08/2011   Barrett's esophagus without dysplasia 05/08/2011   Former tobacco use 02/01/2011   Past Medical History:  Diagnosis Date   Carotid artery occlusion 11/10/10   LEFT CAROTID ENDARTERECTOMY   Chronic kidney disease    Complication of anesthesia    BP WENT UP AT [facility] "   COPD (chronic obstructive pulmonary disease) ([place])    pt denies this dx as of 06/01/20 - no inhaler    Diabetes mellitus without complication ([place])    Diverticulitis    Diverticulosis of colon (without mention of hemorrhage)    DJD (degenerative joint disease)    knees/hands/feet/back/neck   Fatty liver    Full dentures    GERD (gastroesophageal reflux disease)    H/O hiatal hernia    History of blood transfusion    with a past surical procedure per patient 06/01/20   Hyperlipidemia    Hypertension    Neuromuscular disorder ([place])    peripheral neuropathy   Non-pressure chronic ulcer of other part of left foot limited to breakdown of skin ([place]) 11/12/2016   Osteomyelitis (HCC)    left 5th metatarsal   PAD (peripheral artery disease) ([place])    Distal aortogram June 2012. Atherectomy left popliteal artery July 2012.    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 Before age 82   Hyperlipidemia Brother    Hypertension Brother    Hypertension Son    Arthritis Other        GRANDMOTHER   Hypertension Other        OTHER FAMILY MEMBERS    Past Surgical History:  Procedure Laterality Date   ABDOMINAL AORTOGRAM W/LOWER EXTREMITY N/A 06/23/2021   Procedure: ABDOMINAL AORTOGRAM W/LOWER EXTREMITY;  Surgeon: Nigel Mormon, MD;  Location: [facility];  Service: Cardiovascular;  Laterality: N/A;   AMPUTATION  11/05/2011   Procedure: AMPUTATION RAY;  Surgeon: Wylene Simmer, MD;  Location: [facility];  Service: Orthopedics;  Laterality: Right;  Amputation of Right 4&5th Toes   AMPUTATION Left 11/26/2012   Procedure: AMPUTATION RAY;  Surgeon: Wylene Simmer, MD;  Location: [facility];  Service: Orthopedics;  Laterality: Left;  fourth ray amputation   AMPUTATION Right 08/27/2014   Procedure: Transmetatarsal Amputation;  Surgeon: Newt Minion, MD;  Location: [facility];  Service: Orthopedics;  Laterality: Right;   AMPUTATION Right 01/14/2015   Procedure: AMPUTATION BELOW KNEE;  Surgeon: Newt Minion, MD;  Location: [facility];  Service: Orthopedics;  Laterality: Right;   AMPUTATION Left 10/21/2015   Procedure: Left Foot 5th Ray Amputation;  Surgeon: Newt Minion, MD;  Location: [facility];  Service: Orthopedics;  Laterality: Left;   ANTERIOR FUSION CERVICAL  SPINE  02/06/06   C4-5, C5-6, C6-7; SURGEON DR. MAX COHEN   AV FISTULA PLACEMENT Left 06/02/2020   Procedure: ARTERIOVENOUS (AV) FISTULA CREATION LEFT;  Surgeon: Waynetta Sandy, MD;  Location: [facility];  Service: Vascular;  Laterality: Left;   BACK SURGERY     x 3   [place] Left 07/21/2020   Procedure: LEFT UPPER ARM ATERIOVENOUS SUPERFISTULALIZATION;  Surgeon: Waynetta Sandy, MD;  Location: [facility];  Service: Vascular;  Laterality: Left;   BELOW KNEE LEG AMPUTATION Right    CARDIAC CATHETERIZATION  10/31/04   2009   CAROTID ENDARTERECTOMY  11/10/10   CAROTID ENDARTERECTOMY Left 11/10/2010   Subtotal occlusion of left internal carotid artery with left hemispheric transient ischemic attacks.   CAROTID STENT     CARPAL TUNNEL RELEASE Right 10/21/2013   Procedure: RIGHT CARPAL TUNNEL RELEASE;  Surgeon: Wynonia Sours, MD;  Location: [facility];  Service: Orthopedics;  Laterality: Right;   CHOLECYSTECTOMY     COLON SURGERY     COLONOSCOPY     COLOSTOMY REVERSAL  05/21/2018   ileostomy reversal   CYSTOSCOPY WITH STENT PLACEMENT Bilateral 01/13/2018   Procedure: CYSTOSCOPY WITH BILATERAL URETERAL CATHETER PLACEMENT;  Surgeon: Ardis Hughs, MD;  [facility];  Service: Urology;  Laterality: Bilateral;   ESOPHAGEAL MANOMETRY Bilateral 07/19/2014   Procedure: ESOPHAGEAL MANOMETRY (EM);  Surgeon: Jerene Bears, MD;  [facility];  Service: Gastroenterology;  Laterality: Bilateral;   EYE SURGERY Bilateral 2020   cataract   FEMORAL ARTERY STENT     x6   FINGER SURGERY     FOOT SURGERY  04/25/2016    EXCISION BASE 5TH METATARSAL AND PARTIAL CUBOID LEFT FOOT   HERNIA REPAIR     LEFT INGUINAL AND UMBILICAL REPAIRS   HERNIA REPAIR     I & D EXTREMITY Left 04/25/2016   Procedure: EXCISION BASE 5TH METATARSAL AND PARTIAL CUBOID LEFT FOOT;  Surgeon: Newt Minion, MD;  Location: [facility];  Service: Orthopedics;  Laterality: Left;   ILEOSTOMY   01/13/2018   Procedure: ILEOSTOMY;  Surgeon: Clovis Riley, MD;  [facility];  Service: General;;   ILEOSTOMY CLOSURE N/A 05/21/2018   Procedure: ILEOSTOMY REVERSAL ERAS PATHWAY;  Surgeon: Clovis Riley, MD;  Location: [facility];  Service: General;  Laterality: N/A;   IR RADIOLOGIST EVAL & MGMT  11/19/2017   IR RADIOLOGIST EVAL & MGMT  12/03/2017   IR RADIOLOGIST EVAL & MGMT  12/18/2017   JOINT REPLACEMENT Right 2001   Total knee   LAMINECTOMY     X 3 LUMBAR AND X 2 CERVICAL SPINE OPERATIONS   LAPAROSCOPIC CHOLECYSTECTOMY W/ CHOLANGIOGRAPHY  11/09/04   SURGEON DR. [place] CATH AND CORONARY ANGIOGRAPHY N/A 09/16/2018   Procedure: LEFT HEART CATH AND CORONARY ANGIOGRAPHY;  Surgeon: Nigel Mormon, MD;  Location: [facility];  Service: Cardiovascular;  Laterality: N/A;   LEFT HEART CATHETERIZATION WITH CORONARY ANGIOGRAM N/A 10/29/2014   Procedure: LEFT HEART CATHETERIZATION WITH CORONARY ANGIOGRAM;  Surgeon: Laverda Page, MD;  Location[facility];  Service: Cardiovascular;  Laterality: N/A;   LIGATION OF COMPETING BRANCHES OF ARTERIOVENOUS FISTULA Left 07/21/2020   Procedure: LIGATION OF COMPETING BRANCHES OF LEFT UPPER ARM ARTERIOVENOUS FISTULA;  Surgeon: Waynetta Sandy, MD;  Location: [facility];  Service: Vascular;  Laterality: Left;   LOWER  EXTREMITY ANGIOGRAM N/A 03/19/2012   Procedure: LOWER EXTREMITY ANGIOGRAM;  Surgeon: Burnell Blanks, MD;  Location: [facility]ATH LAB;  Service: Cardiovascular;  Laterality: N/A;   LOWER EXTREMITY ANGIOGRAPHY N/A 06/20/2021   Procedure: LOWER EXTREMITY ANGIOGRAPHY;  Surgeon: Nigel Mormon, MD;  Location: [facility];  Service: Cardiovascular;  Laterality: N/A;   NECK SURGERY     PARTIAL COLECTOMY N/A 01/13/2018   Procedure: LAPAROSCOPIC ASSISTED   SIGMOID COLECTOMY ILEOSTOMY;  Surgeon: Clovis Riley, MD;  [facility];  Service: General;  Laterality: N/A;   PENILE PROSTHESIS IMPLANT   08/14/05   INFRAPUBIC INSERTION OF INFLATABLE PENILE PROSTHESIS; SURGEON DR. Amalia Hailey   PENILE PROSTHESIS IMPLANT     PERCUTANEOUS CORONARY STENT INTERVENTION (PCI-S) Right 10/29/2014   Procedure: PERCUTANEOUS CORONARY STENT INTERVENTION (PCI-S);  Surgeon: Laverda Page, MD;  Location: [facility] CATH LAB;  Service: Cardiovascular;  Laterality: Right;   PERIPHERAL VASCULAR INTERVENTION Left 06/23/2021   Procedure: PERIPHERAL VASCULAR INTERVENTION;  Surgeon: Nigel Mormon, MD;  Location: [facility];  Service: Cardiovascular;  Laterality: Left;   REMOVAL OF PENILE PROSTHESIS N/A 06/14/2021   Procedure: Removal of THREE piece inflatable penile prosthesis;  Surgeon: Lucas Mallow, MD;  Location: [facility];  Service: Urology;  Laterality: N/A;   SHOULDER ARTHROSCOPY     SPINE SURGERY     TOE AMPUTATION Left    TONSILLECTOMY     TOTAL KNEE ARTHROPLASTY  07/2002   RIGHT KNEE ; SURGEON  DR. [name] ALSO HAD ARTHROSCOPIC RIGHT KNEE IN  10/2001   TOTAL KNEE ARTHROPLASTY     ULNAR NERVE TRANSPOSITION Right 10/21/2013   Procedure: RIGHT ELBOW  ULNAR NERVE DECOMPRESSION;  Surgeon: Wynonia Sours, MD;  Location: [facility];  Service: Orthopedics;  Laterality: Right;   Social History   Occupational History   Occupation: Magazine features editor: UNEMPLOYED  Tobacco Use   Smoking status: Former    Packs/day: 2.00    Years: 35.00    Total pack years: 70.00    Types: Cigarettes    Quit date: 10/28/2011    Years since quitting: 10.6   Smokeless tobacco: Never  Vaping Use   Vaping Use: Some days   Substances: Nicotine  Substance and Sexual Activity   Alcohol use: Not Currently    Comment: "not in a long time"   Drug use: Never   Sexual activity: Yes    Birth control/protection: Implant    Comment: penile implant

## 2022-06-19 DIAGNOSIS — D509 Iron deficiency anemia, unspecified: Secondary | ICD-10-CM | POA: Diagnosis not present

## 2022-06-19 DIAGNOSIS — N186 End stage renal disease: Secondary | ICD-10-CM | POA: Diagnosis not present

## 2022-06-19 DIAGNOSIS — Z992 Dependence on renal dialysis: Secondary | ICD-10-CM | POA: Diagnosis not present

## 2022-06-19 DIAGNOSIS — E1122 Type 2 diabetes mellitus with diabetic chronic kidney disease: Secondary | ICD-10-CM | POA: Diagnosis not present

## 2022-06-19 DIAGNOSIS — D631 Anemia in chronic kidney disease: Secondary | ICD-10-CM | POA: Diagnosis not present

## 2022-06-19 DIAGNOSIS — N2581 Secondary hyperparathyroidism of renal origin: Secondary | ICD-10-CM | POA: Diagnosis not present

## 2022-06-21 DIAGNOSIS — D631 Anemia in chronic kidney disease: Secondary | ICD-10-CM | POA: Diagnosis not present

## 2022-06-21 DIAGNOSIS — E1122 Type 2 diabetes mellitus with diabetic chronic kidney disease: Secondary | ICD-10-CM | POA: Diagnosis not present

## 2022-06-21 DIAGNOSIS — D509 Iron deficiency anemia, unspecified: Secondary | ICD-10-CM | POA: Diagnosis not present

## 2022-06-21 DIAGNOSIS — N2581 Secondary hyperparathyroidism of renal origin: Secondary | ICD-10-CM | POA: Diagnosis not present

## 2022-06-21 DIAGNOSIS — N186 End stage renal disease: Secondary | ICD-10-CM | POA: Diagnosis not present

## 2022-06-21 DIAGNOSIS — Z992 Dependence on renal dialysis: Secondary | ICD-10-CM | POA: Diagnosis not present

## 2022-06-22 ENCOUNTER — Other Ambulatory Visit: Payer: Medicare HMO | Admitting: Student

## 2022-06-22 DIAGNOSIS — N186 End stage renal disease: Secondary | ICD-10-CM | POA: Diagnosis not present

## 2022-06-22 DIAGNOSIS — Z515 Encounter for palliative care: Secondary | ICD-10-CM | POA: Diagnosis not present

## 2022-06-22 DIAGNOSIS — R112 Nausea with vomiting, unspecified: Secondary | ICD-10-CM

## 2022-06-22 DIAGNOSIS — G8929 Other chronic pain: Secondary | ICD-10-CM

## 2022-06-22 DIAGNOSIS — L97521 Non-pressure chronic ulcer of other part of left foot limited to breakdown of skin: Secondary | ICD-10-CM | POA: Diagnosis not present

## 2022-06-22 DIAGNOSIS — M549 Dorsalgia, unspecified: Secondary | ICD-10-CM | POA: Diagnosis not present

## 2022-06-22 DIAGNOSIS — Z992 Dependence on renal dialysis: Secondary | ICD-10-CM | POA: Diagnosis not present

## 2022-06-23 ENCOUNTER — Other Ambulatory Visit: Payer: Self-pay

## 2022-06-23 ENCOUNTER — Emergency Department (HOSPITAL_COMMUNITY): Payer: Medicare HMO

## 2022-06-23 ENCOUNTER — Inpatient Hospital Stay (HOSPITAL_COMMUNITY)
Admission: EM | Admit: 2022-06-23 | Discharge: 2022-06-29 | DRG: 871 | Disposition: A | Discharge status: Home Health | Payer: Medicare HMO | Attending: Family Medicine | Admitting: Family Medicine

## 2022-06-23 ENCOUNTER — Encounter (HOSPITAL_COMMUNITY): Payer: Self-pay | Admitting: Emergency Medicine

## 2022-06-23 DIAGNOSIS — E872 Acidosis, unspecified: Secondary | ICD-10-CM | POA: Diagnosis not present

## 2022-06-23 DIAGNOSIS — L89309 Pressure ulcer of unspecified buttock, unspecified stage: Secondary | ICD-10-CM

## 2022-06-23 DIAGNOSIS — Z0389 Encounter for observation for other suspected diseases and conditions ruled out: Secondary | ICD-10-CM | POA: Diagnosis not present

## 2022-06-23 DIAGNOSIS — J449 Chronic obstructive pulmonary disease, unspecified: Secondary | ICD-10-CM | POA: Diagnosis not present

## 2022-06-23 DIAGNOSIS — E119 Type 2 diabetes mellitus without complications: Secondary | ICD-10-CM | POA: Diagnosis not present

## 2022-06-23 DIAGNOSIS — Z83438 Family history of other disorder of lipoprotein metabolism and other lipidemia: Secondary | ICD-10-CM

## 2022-06-23 DIAGNOSIS — Z981 Arthrodesis status: Secondary | ICD-10-CM

## 2022-06-23 DIAGNOSIS — E1151 Type 2 diabetes mellitus with diabetic peripheral angiopathy without gangrene: Secondary | ICD-10-CM | POA: Diagnosis present

## 2022-06-23 DIAGNOSIS — L89322 Pressure ulcer of left buttock, stage 2: Secondary | ICD-10-CM | POA: Diagnosis present

## 2022-06-23 DIAGNOSIS — Z794 Long term (current) use of insulin: Secondary | ICD-10-CM | POA: Diagnosis not present

## 2022-06-23 DIAGNOSIS — N281 Cyst of kidney, acquired: Secondary | ICD-10-CM | POA: Diagnosis not present

## 2022-06-23 DIAGNOSIS — A419 Sepsis, unspecified organism: Secondary | ICD-10-CM | POA: Diagnosis not present

## 2022-06-23 DIAGNOSIS — M86672 Other chronic osteomyelitis, left ankle and foot: Secondary | ICD-10-CM | POA: Diagnosis not present

## 2022-06-23 DIAGNOSIS — Z743 Need for continuous supervision: Secondary | ICD-10-CM | POA: Diagnosis not present

## 2022-06-23 DIAGNOSIS — I120 Hypertensive chronic kidney disease with stage 5 chronic kidney disease or end stage renal disease: Secondary | ICD-10-CM | POA: Diagnosis not present

## 2022-06-23 DIAGNOSIS — Z91041 Radiographic dye allergy status: Secondary | ICD-10-CM

## 2022-06-23 DIAGNOSIS — Z23 Encounter for immunization: Secondary | ICD-10-CM

## 2022-06-23 DIAGNOSIS — M8660 Other chronic osteomyelitis, unspecified site: Secondary | ICD-10-CM

## 2022-06-23 DIAGNOSIS — D631 Anemia in chronic kidney disease: Secondary | ICD-10-CM | POA: Diagnosis not present

## 2022-06-23 DIAGNOSIS — L97529 Non-pressure chronic ulcer of other part of left foot with unspecified severity: Secondary | ICD-10-CM | POA: Diagnosis not present

## 2022-06-23 DIAGNOSIS — I739 Peripheral vascular disease, unspecified: Secondary | ICD-10-CM

## 2022-06-23 DIAGNOSIS — Z8679 Personal history of other diseases of the circulatory system: Secondary | ICD-10-CM

## 2022-06-23 DIAGNOSIS — I482 Chronic atrial fibrillation, unspecified: Secondary | ICD-10-CM | POA: Diagnosis not present

## 2022-06-23 DIAGNOSIS — I10 Essential (primary) hypertension: Secondary | ICD-10-CM | POA: Diagnosis not present

## 2022-06-23 DIAGNOSIS — I5030 Unspecified diastolic (congestive) heart failure: Secondary | ICD-10-CM | POA: Diagnosis present

## 2022-06-23 DIAGNOSIS — Z992 Dependence on renal dialysis: Secondary | ICD-10-CM | POA: Diagnosis not present

## 2022-06-23 DIAGNOSIS — Z800 Family history of malignant neoplasm of digestive organs: Secondary | ICD-10-CM

## 2022-06-23 DIAGNOSIS — R0789 Other chest pain: Secondary | ICD-10-CM | POA: Diagnosis not present

## 2022-06-23 DIAGNOSIS — I2510 Atherosclerotic heart disease of native coronary artery without angina pectoris: Secondary | ICD-10-CM

## 2022-06-23 DIAGNOSIS — I350 Nonrheumatic aortic (valve) stenosis: Secondary | ICD-10-CM | POA: Diagnosis not present

## 2022-06-23 DIAGNOSIS — Z91040 Latex allergy status: Secondary | ICD-10-CM

## 2022-06-23 DIAGNOSIS — R6520 Severe sepsis without septic shock: Secondary | ICD-10-CM

## 2022-06-23 DIAGNOSIS — K219 Gastro-esophageal reflux disease without esophagitis: Secondary | ICD-10-CM | POA: Diagnosis present

## 2022-06-23 DIAGNOSIS — L03116 Cellulitis of left lower limb: Secondary | ICD-10-CM | POA: Diagnosis not present

## 2022-06-23 DIAGNOSIS — Z955 Presence of coronary angioplasty implant and graft: Secondary | ICD-10-CM

## 2022-06-23 DIAGNOSIS — Z7901 Long term (current) use of anticoagulants: Secondary | ICD-10-CM

## 2022-06-23 DIAGNOSIS — Z9049 Acquired absence of other specified parts of digestive tract: Secondary | ICD-10-CM

## 2022-06-23 DIAGNOSIS — Z89511 Acquired absence of right leg below knee: Secondary | ICD-10-CM | POA: Diagnosis not present

## 2022-06-23 DIAGNOSIS — E11621 Type 2 diabetes mellitus with foot ulcer: Secondary | ICD-10-CM | POA: Diagnosis present

## 2022-06-23 DIAGNOSIS — Z9889 Other specified postprocedural states: Secondary | ICD-10-CM | POA: Diagnosis not present

## 2022-06-23 DIAGNOSIS — R000 Tachycardia, unspecified: Secondary | ICD-10-CM | POA: Diagnosis not present

## 2022-06-23 DIAGNOSIS — A401 Sepsis due to streptococcus, group B: Secondary | ICD-10-CM | POA: Diagnosis not present

## 2022-06-23 DIAGNOSIS — Z89432 Acquired absence of left foot: Secondary | ICD-10-CM

## 2022-06-23 DIAGNOSIS — G9341 Metabolic encephalopathy: Secondary | ICD-10-CM | POA: Diagnosis present

## 2022-06-23 DIAGNOSIS — R739 Hyperglycemia, unspecified: Secondary | ICD-10-CM | POA: Diagnosis not present

## 2022-06-23 DIAGNOSIS — E1122 Type 2 diabetes mellitus with diabetic chronic kidney disease: Secondary | ICD-10-CM | POA: Diagnosis present

## 2022-06-23 DIAGNOSIS — E1169 Type 2 diabetes mellitus with other specified complication: Secondary | ICD-10-CM | POA: Diagnosis present

## 2022-06-23 DIAGNOSIS — M19072 Primary osteoarthritis, left ankle and foot: Secondary | ICD-10-CM | POA: Diagnosis not present

## 2022-06-23 DIAGNOSIS — R6889 Other general symptoms and signs: Secondary | ICD-10-CM | POA: Diagnosis not present

## 2022-06-23 DIAGNOSIS — R600 Localized edema: Secondary | ICD-10-CM | POA: Diagnosis not present

## 2022-06-23 DIAGNOSIS — I132 Hypertensive heart and chronic kidney disease with heart failure and with stage 5 chronic kidney disease, or end stage renal disease: Secondary | ICD-10-CM | POA: Diagnosis not present

## 2022-06-23 DIAGNOSIS — Z8249 Family history of ischemic heart disease and other diseases of the circulatory system: Secondary | ICD-10-CM

## 2022-06-23 DIAGNOSIS — N250 Renal osteodystrophy: Secondary | ICD-10-CM | POA: Diagnosis not present

## 2022-06-23 DIAGNOSIS — L89312 Pressure ulcer of right buttock, stage 2: Secondary | ICD-10-CM | POA: Diagnosis present

## 2022-06-23 DIAGNOSIS — I5032 Chronic diastolic (congestive) heart failure: Secondary | ICD-10-CM

## 2022-06-23 DIAGNOSIS — J9811 Atelectasis: Secondary | ICD-10-CM | POA: Diagnosis not present

## 2022-06-23 DIAGNOSIS — E669 Obesity, unspecified: Secondary | ICD-10-CM | POA: Diagnosis not present

## 2022-06-23 DIAGNOSIS — Z96651 Presence of right artificial knee joint: Secondary | ICD-10-CM | POA: Diagnosis present

## 2022-06-23 DIAGNOSIS — E1140 Type 2 diabetes mellitus with diabetic neuropathy, unspecified: Secondary | ICD-10-CM | POA: Diagnosis present

## 2022-06-23 DIAGNOSIS — Z95820 Peripheral vascular angioplasty status with implants and grafts: Secondary | ICD-10-CM | POA: Diagnosis not present

## 2022-06-23 DIAGNOSIS — I480 Paroxysmal atrial fibrillation: Secondary | ICD-10-CM | POA: Diagnosis present

## 2022-06-23 DIAGNOSIS — Z1152 Encounter for screening for COVID-19: Secondary | ICD-10-CM | POA: Diagnosis not present

## 2022-06-23 DIAGNOSIS — R0602 Shortness of breath: Secondary | ICD-10-CM | POA: Diagnosis not present

## 2022-06-23 DIAGNOSIS — G894 Chronic pain syndrome: Secondary | ICD-10-CM | POA: Diagnosis present

## 2022-06-23 DIAGNOSIS — Z87891 Personal history of nicotine dependence: Secondary | ICD-10-CM

## 2022-06-23 DIAGNOSIS — K5730 Diverticulosis of large intestine without perforation or abscess without bleeding: Secondary | ICD-10-CM | POA: Diagnosis not present

## 2022-06-23 DIAGNOSIS — M898X9 Other specified disorders of bone, unspecified site: Secondary | ICD-10-CM | POA: Diagnosis present

## 2022-06-23 DIAGNOSIS — Z91158 Patient's noncompliance with renal dialysis for other reason: Secondary | ICD-10-CM

## 2022-06-23 DIAGNOSIS — R079 Chest pain, unspecified: Secondary | ICD-10-CM | POA: Diagnosis not present

## 2022-06-23 DIAGNOSIS — N186 End stage renal disease: Secondary | ICD-10-CM | POA: Diagnosis not present

## 2022-06-23 DIAGNOSIS — E785 Hyperlipidemia, unspecified: Secondary | ICD-10-CM | POA: Diagnosis present

## 2022-06-23 DIAGNOSIS — I3481 Nonrheumatic mitral (valve) annulus calcification: Secondary | ICD-10-CM | POA: Diagnosis not present

## 2022-06-23 DIAGNOSIS — Z79899 Other long term (current) drug therapy: Secondary | ICD-10-CM

## 2022-06-23 DIAGNOSIS — Z833 Family history of diabetes mellitus: Secondary | ICD-10-CM

## 2022-06-23 DIAGNOSIS — N400 Enlarged prostate without lower urinary tract symptoms: Secondary | ICD-10-CM | POA: Diagnosis present

## 2022-06-23 DIAGNOSIS — Z8614 Personal history of Methicillin resistant Staphylococcus aureus infection: Secondary | ICD-10-CM

## 2022-06-23 DIAGNOSIS — G2581 Restless legs syndrome: Secondary | ICD-10-CM | POA: Diagnosis present

## 2022-06-23 DIAGNOSIS — Z8673 Personal history of transient ischemic attack (TIA), and cerebral infarction without residual deficits: Secondary | ICD-10-CM

## 2022-06-23 DIAGNOSIS — Z6837 Body mass index (BMI) 37.0-37.9, adult: Secondary | ICD-10-CM

## 2022-06-23 LAB — COMPREHENSIVE METABOLIC PANEL
ALT: 16 U/L (ref 0–44)
AST: 20 U/L (ref 15–41)
Albumin: 3.1 g/dL — ABNORMAL LOW (ref 3.5–5.0)
Alkaline Phosphatase: 133 U/L — ABNORMAL HIGH (ref 38–126)
Anion gap: 14 (ref 5–15)
BUN: 34 mg/dL — ABNORMAL HIGH (ref 8–23)
CO2: 25 mmol/L (ref 22–32)
Calcium: 8.1 mg/dL — ABNORMAL LOW (ref 8.9–10.3)
Chloride: 100 mmol/L (ref 98–111)
Creatinine, Ser: 6.07 mg/dL — ABNORMAL HIGH (ref 0.61–1.24)
GFR, Estimated: 9 mL/min — ABNORMAL LOW (ref >60)
Glucose, Bld: 352 mg/dL — ABNORMAL HIGH (ref 70–99)
Potassium: 4.1 mmol/L (ref 3.5–5.1)
Sodium: 139 mmol/L (ref 135–145)
Total Bilirubin: 0.9 mg/dL (ref 0.3–1.2)
Total Protein: 6.1 g/dL — ABNORMAL LOW (ref 6.5–8.1)

## 2022-06-23 LAB — CBC WITH DIFFERENTIAL/PLATELET
Abs Immature Granulocytes: 0.16 10*3/uL — ABNORMAL HIGH (ref 0.00–0.07)
Basophils Absolute: 0 10*3/uL (ref 0.0–0.1)
Basophils Relative: 0 %
Eosinophils Absolute: 0 10*3/uL (ref 0.0–0.5)
Eosinophils Relative: 0 %
HCT: 33 % — ABNORMAL LOW (ref 39.0–52.0)
Hemoglobin: 11.3 g/dL — ABNORMAL LOW (ref 13.0–17.0)
Immature Granulocytes: 1 %
Lymphocytes Relative: 2 %
Lymphs Abs: 0.3 10*3/uL — ABNORMAL LOW (ref 0.7–4.0)
MCH: 31.8 pg (ref 26.0–34.0)
MCHC: 34.2 g/dL (ref 30.0–36.0)
MCV: 93 fL (ref 80.0–100.0)
Monocytes Absolute: 0.7 10*3/uL (ref 0.1–1.0)
Monocytes Relative: 6 %
Neutro Abs: 12.2 10*3/uL — ABNORMAL HIGH (ref 1.7–7.7)
Neutrophils Relative %: 91 %
Platelets: 141 10*3/uL — ABNORMAL LOW (ref 150–400)
RBC: 3.55 MIL/uL — ABNORMAL LOW (ref 4.22–5.81)
RDW: 13.5 % (ref 11.5–15.5)
WBC: 13.5 10*3/uL — ABNORMAL HIGH (ref 4.0–10.5)
nRBC: 0 % (ref 0.0–0.2)

## 2022-06-23 LAB — URINALYSIS, ROUTINE W REFLEX MICROSCOPIC
Bacteria, UA: NONE SEEN
Bilirubin Urine: NEGATIVE
Glucose, UA: >=500 mg/dL — AB
Ketones, ur: NEGATIVE mg/dL
Leukocytes,Ua: NEGATIVE
Nitrite: NEGATIVE
Protein, ur: 100 mg/dL — AB
Specific Gravity, Urine: 1.013 (ref 1.005–1.030)
pH: 8 (ref 5.0–8.0)

## 2022-06-23 LAB — I-STAT VENOUS BLOOD GAS, ED
Acid-Base Excess: 0 mmol/L (ref 0.0–2.0)
Bicarbonate: 25.4 mmol/L (ref 20.0–28.0)
Calcium, Ion: 1.02 mmol/L — ABNORMAL LOW (ref 1.15–1.40)
HCT: 30 % — ABNORMAL LOW (ref 39.0–52.0)
Hemoglobin: 10.2 g/dL — ABNORMAL LOW (ref 13.0–17.0)
O2 Saturation: 63 %
Potassium: 4.4 mmol/L (ref 3.5–5.1)
Sodium: 137 mmol/L (ref 135–145)
TCO2: 27 mmol/L (ref 22–32)
pCO2, Ven: 42.7 mmHg — ABNORMAL LOW (ref 44–60)
pH, Ven: 7.383 (ref 7.25–7.43)
pO2, Ven: 33 mmHg (ref 32–45)

## 2022-06-23 LAB — LACTIC ACID, PLASMA
Lactic Acid, Venous: 3.7 mmol/L (ref 0.5–1.9)
Lactic Acid, Venous: 4.1 mmol/L (ref 0.5–1.9)
Lactic Acid, Venous: 5.2 mmol/L (ref 0.5–1.9)
Lactic Acid, Venous: 4.9 mmol/L (ref 0.5–1.9)

## 2022-06-23 LAB — RESP PANEL BY RT-PCR (FLU A&B, COVID) ARPGX2
Influenza A by PCR: NEGATIVE
Influenza B by PCR: NEGATIVE
SARS Coronavirus 2 by RT PCR: NEGATIVE

## 2022-06-23 LAB — CBG MONITORING, ED: Glucose-Capillary: 216 mg/dL — ABNORMAL HIGH (ref 70–99)

## 2022-06-23 LAB — PROTIME-INR
INR: 1.3 — ABNORMAL HIGH (ref 0.8–1.2)
Prothrombin Time: 16.2 seconds — ABNORMAL HIGH (ref 11.4–15.2)

## 2022-06-23 LAB — PREALBUMIN: Prealbumin: 18 mg/dL (ref 18–38)

## 2022-06-23 LAB — SEDIMENTATION RATE: Sed Rate: 26 mm/hr — ABNORMAL HIGH (ref 0–16)

## 2022-06-23 LAB — C-REACTIVE PROTEIN: CRP: 14 mg/dL — ABNORMAL HIGH (ref <1.0)

## 2022-06-23 MED ORDER — ACETAMINOPHEN 325 MG PO TABS
325.0000 mg | ORAL_TABLET | Freq: Once | ORAL | Status: AC
  Administered 2022-06-23: 325 mg via ORAL

## 2022-06-23 MED ORDER — ROPINIROLE HCL 1 MG PO TABS
2.0000 mg | ORAL_TABLET | Freq: Every day | ORAL | Status: DC
  Administered 2022-06-23 – 2022-06-29 (×7): 2 mg via ORAL
  Filled 2022-06-23: qty 2
  Filled 2022-06-23 (×3): qty 4
  Filled 2022-06-23: qty 2
  Filled 2022-06-23: qty 4
  Filled 2022-06-23: qty 2
  Filled 2022-06-23: qty 4

## 2022-06-23 MED ORDER — METRONIDAZOLE 500 MG PO TABS
500.0000 mg | ORAL_TABLET | Freq: Two times a day (BID) | ORAL | Status: DC
  Administered 2022-06-23 – 2022-06-24 (×2): 500 mg via ORAL
  Filled 2022-06-23 (×2): qty 1

## 2022-06-23 MED ORDER — PANTOPRAZOLE SODIUM 40 MG PO TBEC
40.0000 mg | DELAYED_RELEASE_TABLET | Freq: Every day | ORAL | Status: DC
  Administered 2022-06-24 – 2022-06-29 (×6): 40 mg via ORAL
  Filled 2022-06-23 (×6): qty 1

## 2022-06-23 MED ORDER — AMIODARONE HCL 200 MG PO TABS
100.0000 mg | ORAL_TABLET | Freq: Every day | ORAL | Status: DC
  Administered 2022-06-24 – 2022-06-29 (×6): 100 mg via ORAL
  Filled 2022-06-23 (×6): qty 1

## 2022-06-23 MED ORDER — ONDANSETRON HCL 4 MG PO TABS: 4.0000 mg | ORAL_TABLET | Freq: Four times a day (QID) | ORAL | Status: DC | PRN

## 2022-06-23 MED ORDER — LORATADINE 10 MG PO TABS
10.0000 mg | ORAL_TABLET | Freq: Every day | ORAL | Status: DC
  Administered 2022-06-24 – 2022-06-29 (×6): 10 mg via ORAL
  Filled 2022-06-23 (×6): qty 1

## 2022-06-23 MED ORDER — ATORVASTATIN CALCIUM 40 MG PO TABS
40.0000 mg | ORAL_TABLET | Freq: Every day | ORAL | Status: DC
  Administered 2022-06-23 – 2022-06-29 (×7): 40 mg via ORAL
  Filled 2022-06-23 (×7): qty 1

## 2022-06-23 MED ORDER — METOCLOPRAMIDE HCL 5 MG PO TABS
5.0000 mg | ORAL_TABLET | Freq: Three times a day (TID) | ORAL | Status: DC
  Administered 2022-06-24 – 2022-06-29 (×14): 5 mg via ORAL
  Filled 2022-06-23 (×14): qty 1

## 2022-06-23 MED ORDER — FAMOTIDINE 20 MG PO TABS
20.0000 mg | ORAL_TABLET | Freq: Every day | ORAL | Status: DC
  Administered 2022-06-24 – 2022-06-29 (×6): 20 mg via ORAL
  Filled 2022-06-23 (×6): qty 1

## 2022-06-23 MED ORDER — ACETAMINOPHEN 325 MG PO TABS
650.0000 mg | ORAL_TABLET | Freq: Once | ORAL | Status: AC | PRN
  Administered 2022-06-23: 650 mg via ORAL
  Filled 2022-06-23: qty 2

## 2022-06-23 MED ORDER — SODIUM CHLORIDE 0.9 % IV SOLN
1.0000 g | Freq: Once | INTRAVENOUS | Status: AC
  Administered 2022-06-23: 1 g via INTRAVENOUS
  Filled 2022-06-23: qty 10

## 2022-06-23 MED ORDER — ACETAMINOPHEN 650 MG RE SUPP: 650.0000 mg | Freq: Four times a day (QID) | RECTAL | Status: DC | PRN

## 2022-06-23 MED ORDER — POLYVINYL ALCOHOL 1.4 % OP SOLN: 1.0000 [drp] | Freq: Two times a day (BID) | OPHTHALMIC | Status: DC | PRN

## 2022-06-23 MED ORDER — FOLIC ACID 1 MG PO TABS
1.0000 mg | ORAL_TABLET | Freq: Every day | ORAL | Status: DC
  Administered 2022-06-24 – 2022-06-29 (×6): 1 mg via ORAL
  Filled 2022-06-23 (×6): qty 1

## 2022-06-23 MED ORDER — ONDANSETRON HCL 4 MG/2ML IJ SOLN
4.0000 mg | Freq: Four times a day (QID) | INTRAMUSCULAR | Status: DC | PRN
  Administered 2022-06-24 – 2022-06-29 (×2): 4 mg via INTRAVENOUS
  Filled 2022-06-23 (×2): qty 2

## 2022-06-23 MED ORDER — INSULIN ASPART 100 UNIT/ML IJ SOLN
0.0000 [IU] | Freq: Three times a day (TID) | INTRAMUSCULAR | Status: DC
  Administered 2022-06-24 (×3): 2 [IU] via SUBCUTANEOUS
  Administered 2022-06-25 (×2): 1 [IU] via SUBCUTANEOUS
  Administered 2022-06-26: 7 [IU] via SUBCUTANEOUS
  Administered 2022-06-26 – 2022-06-27 (×3): 5 [IU] via SUBCUTANEOUS
  Administered 2022-06-27 – 2022-06-28 (×2): 3 [IU] via SUBCUTANEOUS
  Administered 2022-06-28 – 2022-06-29 (×4): 2 [IU] via SUBCUTANEOUS

## 2022-06-23 MED ORDER — LACTATED RINGERS IV BOLUS
500.0000 mL | Freq: Once | INTRAVENOUS | Status: AC
  Administered 2022-06-23: 500 mL via INTRAVENOUS

## 2022-06-23 MED ORDER — LACTATED RINGERS IV SOLN: INTRAVENOUS | Status: DC

## 2022-06-23 MED ORDER — PREGABALIN 100 MG PO CAPS
100.0000 mg | ORAL_CAPSULE | Freq: Two times a day (BID) | ORAL | Status: DC
  Administered 2022-06-23 – 2022-06-29 (×13): 100 mg via ORAL
  Filled 2022-06-23 (×13): qty 1

## 2022-06-23 MED ORDER — OXYCODONE-ACETAMINOPHEN 7.5-325 MG PO TABS
1.0000 | ORAL_TABLET | Freq: Three times a day (TID) | ORAL | Status: DC | PRN
  Administered 2022-06-23 – 2022-06-29 (×12): 1 via ORAL
  Filled 2022-06-23 (×12): qty 1

## 2022-06-23 MED ORDER — MIDODRINE HCL 5 MG PO TABS
10.0000 mg | ORAL_TABLET | ORAL | Status: DC
  Administered 2022-06-24: 10 mg via ORAL
  Filled 2022-06-23: qty 2

## 2022-06-23 MED ORDER — APIXABAN 5 MG PO TABS
5.0000 mg | ORAL_TABLET | Freq: Two times a day (BID) | ORAL | Status: DC
  Administered 2022-06-23 – 2022-06-29 (×13): 5 mg via ORAL
  Filled 2022-06-23 (×13): qty 1

## 2022-06-23 MED ORDER — INSULIN GLARGINE-YFGN 100 UNIT/ML ~~LOC~~ SOLN
15.0000 [IU] | Freq: Every day | SUBCUTANEOUS | Status: DC
  Administered 2022-06-24 – 2022-06-26 (×4): 15 [IU] via SUBCUTANEOUS
  Filled 2022-06-23 (×6): qty 0.15

## 2022-06-23 MED ORDER — SODIUM CHLORIDE 0.9 % IV SOLN
2.0000 g | INTRAVENOUS | Status: DC
  Administered 2022-06-24: 2 g via INTRAVENOUS
  Filled 2022-06-23: qty 20

## 2022-06-23 MED ORDER — VANCOMYCIN HCL 10 G IV SOLR
2250.0000 mg | Freq: Once | INTRAVENOUS | Status: AC
  Administered 2022-06-23: 2250 mg via INTRAVENOUS
  Filled 2022-06-23: qty 22.5

## 2022-06-23 MED ORDER — HYDROMORPHONE HCL 1 MG/ML IJ SOLN
0.5000 mg | Freq: Once | INTRAMUSCULAR | Status: AC
  Administered 2022-06-23: 0.5 mg via INTRAVENOUS
  Filled 2022-06-23: qty 1

## 2022-06-23 MED ORDER — TAMSULOSIN HCL 0.4 MG PO CAPS
0.4000 mg | ORAL_CAPSULE | Freq: Every day | ORAL | Status: DC
  Administered 2022-06-24 – 2022-06-29 (×6): 0.4 mg via ORAL
  Filled 2022-06-23 (×6): qty 1

## 2022-06-23 MED ORDER — LINACLOTIDE 145 MCG PO CAPS: 145.0000 ug | ORAL_CAPSULE | Freq: Every day | ORAL | Status: DC | PRN

## 2022-06-23 MED ORDER — LACTATED RINGERS IV BOLUS (SEPSIS)
1000.0000 mL | Freq: Once | INTRAVENOUS | Status: AC
  Administered 2022-06-23: 1000 mL via INTRAVENOUS

## 2022-06-23 MED ORDER — ACETAMINOPHEN 325 MG PO TABS
650.0000 mg | ORAL_TABLET | Freq: Four times a day (QID) | ORAL | Status: DC | PRN
  Administered 2022-06-23 – 2022-06-28 (×4): 650 mg via ORAL
  Filled 2022-06-23 (×4): qty 2

## 2022-06-23 MED ORDER — LACTATED RINGERS IV BOLUS
1000.0000 mL | Freq: Once | INTRAVENOUS | Status: AC
  Administered 2022-06-23: 1000 mL via INTRAVENOUS

## 2022-06-23 MED ORDER — VANCOMYCIN HCL IN DEXTROSE 1-5 GM/200ML-% IV SOLN: 1000.0000 mg | INTRAVENOUS | Status: DC

## 2022-06-23 MED ORDER — FENTANYL 50 MCG/HR TD PT72
1.0000 | MEDICATED_PATCH | TRANSDERMAL | Status: DC
  Administered 2022-06-24 – 2022-06-27 (×2): 1 via TRANSDERMAL
  Filled 2022-06-23 (×2): qty 1

## 2022-06-23 NOTE — Progress Notes (Signed)
Pharmacy Antibiotic Note  [name] is a 66 y.o. male admitted on 06/23/2022 presenting with URI, also with foot ulcer.  Pharmacy has been consulted for vancomycin dosing.  ESRD-HD usually TTS  Plan: Vancomycin 2250 mg IV x 1, then 1000 mg IV q HD Monitor HD schedule, Cx and clinical progression to narrow Vancomycin random level as needed     Temp (24hrs), Avg:102.9 F (39.4 C), Min:102.5 F (39.2 C), Max:103.1 F (39.5 C)  Recent Labs  Lab 06/23/22 1348 06/23/22 1404 06/23/22 1623 06/23/22 1823  WBC  --  13.5*  --   --   CREATININE  --  6.07*  --   --   LATICACIDVEN 3.7*  --  4.1* 5.2*    CrCl cannot be calculated (Unknown ideal weight.).    Allergies  Allergen Reactions   Contrast Media [Iodinated Contrast Media] Shortness Of Breath and Other (See Comments)    Difficulty breathing and altered mental status     Ivp Dye [Iodinated Contrast Media] Anaphylaxis, Shortness Of Breath and Other (See Comments)    Breathing problems, altered mental state    Adhesive [Tape] Rash and Other (See Comments)    Rash after 1 day of use   Latex Rash and Other (See Comments)    A severe rash appears after the first 24 hours of being placed    Bertis Ruddy, PharmD Clinical Pharmacist ED Pharmacist Phone # [phone] 06/23/2022 9:48 PM

## 2022-06-23 NOTE — Assessment & Plan Note (Signed)
1. Wound care consult. 2. No mention of severe cellulitis / NSTI findings on CT scan today.

## 2022-06-23 NOTE — Assessment & Plan Note (Signed)
Having to give IVF bolus for sepsis with rising lactic acid.  Watch for development of fluid overload.

## 2022-06-23 NOTE — ED Provider Notes (Signed)
[place] EMERGENCY DEPARTMENT Provider Note   CSN: [id number] Arrival date & time: 06/23/22  1328     History  Chief Complaint  Patient presents with   Weakness    [name] is a 66 y.o. male.  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 He reports URI symptoms for past week. Endorses fever, chills, cough and shortness of breath. Has diffuse abdominal pain and decreased appetite. Denies constipation or diarrhea. He is followed by orthopedics for his left foot ulcer. Last orthopedic visit on 10/9 with continued compression dressing, no cellulitis or drainage noted. He is on Tue/Thu/Sat dialysis but did not go to his session today.    The history is provided by the patient.  Weakness Associated symptoms: abdominal pain, cough, fever and shortness of breath   Associated symptoms: no diarrhea, no dizziness, no headaches, no nausea and no vomiting       Home Medications Prior to Admission medications   Medication Sig Start Date End Date Taking? Authorizing Provider  amiodarone (PACERONE) 200 MG tablet Take 0.5 tablets (100 mg total) by mouth daily. 04/27/22   Adrian Prows, MD  amLODipine (NORVASC) 5 MG tablet Take 5 mg by mouth daily as needed for dizziness. Take one tablet for BP > 160 mm Hg 01/30/21   [provider]  apixaban (ELIQUIS) 5 MG TABS tablet Take 1 tablet (5 mg total) by mouth 2 (two) times daily. 01/14/22 03/15/22  Mercy Riding, MD  atorvastatin (LIPITOR) 40 MG tablet Take 40 mg by mouth at bedtime.     [provider]  B Complex-C-Zn-Folic Acid (DIALYVITE/ZINC) TABS Take 1 tablet by mouth daily. 10/04/21   [provider]  doxycycline (VIBRA-TABS) 100 MG tablet TAKE 1 TABLET BY MOUTH TWICE A DAY 04/02/22   Newt Minion, MD  fentaNYL (DURAGESIC) 50 MCG/HR Place 1 patch onto the skin every 3 (three) days. 09/05/21   Sharen Hones,  MD  folic acid (FOLVITE) 1 MG tablet Take 1 mg by mouth daily. 09/18/21   [provider]  Glucosamine HCl (GLUCOSAMINE PO) Take 1 tablet by mouth 2 (two) times daily.    [provider]  HUMALOG KWIKPEN 100 UNIT/ML KwikPen Inject 4 Units into the skin 3 (three) times daily. Patient taking differently: Inject 13 Units into the skin 3 (three) times daily. 09/05/21   Sharen Hones, MD  insulin glargine (LANTUS) 100 UNIT/ML injection Inject 0.1 mLs (10 Units total) into the skin at bedtime. Patient taking differently: Inject 30 Units into the skin at bedtime. 06/30/21   Antonieta Pert, MD  ketoconazole (NIZORAL) 2 % cream Apply 1 application topically 2 (two) times daily as needed for irritation.  03/07/18   [provider]  lidocaine-prilocaine (EMLA) cream Apply 1 application topically See admin instructions. Prior to Dialysis days Tuesday,Thursday and saturday 06/06/21   [provider]  linaclotide Rolan Lipa) 145 MCG CAPS capsule Take 145 mcg by mouth daily as needed (constipation).     [provider]  loratadine (CLARITIN) 10 MG tablet Take 10 mg by mouth daily.    [provider]  metoCLOPramide (REGLAN) 5 MG tablet Take 1 tablet (5 mg total) by mouth 4 (four) times daily -  before meals and at bedtime. Patient taking differently: Take 5 mg by mouth 3 (three) times daily before meals. 01/14/22 07/13/22  Mercy Riding, MD  midodrine (PROAMATINE) 10 MG tablet Take 1 tablet (10 mg total) by mouth every other day. One hour before dialysis 09/07/21   Barb Merino, MD  NEEDLE, REUSABLE, 22 G 22G X 1-1/2" MISC 1 Units by Does not apply route as directed. For B12 IM inj 02/15/18   Elgergawy, Silver Huguenin, MD  nitroGLYCERIN (NITRODUR - DOSED IN MG/24 HR) 0.2 mg/hr patch Place 1 patch (0.2 mg total) onto the skin daily. 05/21/22   Newt Minion, MD  nitroGLYCERIN (NITROSTAT) 0.4 MG SL tablet Place 0.4 mg under the tongue every 5 (five) minutes as needed for chest pain.  01/06/22   [provider]  omega-3 acid ethyl esters (LOVAZA) 1 g capsule Take 2 g by mouth 2 (two) times daily.    [provider]  ondansetron (ZOFRAN) 4 MG tablet Take 4 mg by mouth every 6 (six) hours as needed for nausea or vomiting.    [provider]  oxyCODONE-acetaminophen (PERCOCET) 7.5-325 MG tablet Take 1 tablet by mouth 3 (three) times daily as needed for moderate pain. Patient taking differently: Take 1 tablet by mouth every 8 (eight) hours. 09/05/21   Sharen Hones, MD  pantoprazole (PROTONIX) 40 MG tablet Take 1 tablet (40 mg total) by mouth 2 (two) times daily for 30 days, THEN 1 tablet (40 mg total) daily. 01/14/22 04/27/22  Mercy Riding, MD  Polyvinyl Alcohol-Povidone PF 1.4-0.6 % SOLN Place 1 drop into both eyes 2 (two) times daily as needed (dry eyes).     [provider]  pregabalin (LYRICA) 100 MG capsule Take 100 mg by mouth 2 (two) times daily. 09/18/21   [provider]  rOPINIRole (REQUIP) 2 MG tablet Take 2 mg by mouth at bedtime.    [provider]  sevelamer carbonate (RENVELA) 800 MG tablet Take 800 mg by mouth 3 (three) times daily. 12/17/21   [provider]  silver sulfADIAZINE (SILVADENE) 1 % cream Apply topically daily. 01/14/22   Mercy Riding, MD  Syringe/Needle, Disp, (SYRINGE 3CC/22GX1-1/2") 22G X 1-1/2" 3 ML MISC 1 Syringe by Does not apply route as directed. For b12 IM inj 02/15/18   Elgergawy, Silver Huguenin, MD  tamsulosin (FLOMAX) 0.4 MG CAPS capsule Take 0.4 mg by mouth daily.    [provider]      Allergies    Contrast media [iodinated contrast media], Ivp dye [iodinated contrast media], Adhesive [tape], and Latex    Review of Systems   Review of Systems  Constitutional:  Positive for appetite change, chills, fatigue and fever.  Respiratory:  Positive for cough and shortness of breath.   Gastrointestinal:  Positive for abdominal pain. Negative for constipation, diarrhea, nausea and vomiting.   Neurological:  Positive for weakness. Negative for dizziness, light-headedness and headaches.    Physical Exam Updated Vital Signs BP (!) 172/77   Pulse (!) 57   Temp (!) 102.5 F (39.2 C) (Oral)   Resp (!) 23   SpO2 96%  Physical Exam HENT:     Head: Normocephalic and atraumatic.  Cardiovascular:     Rate and Rhythm: Regular rhythm. Tachycardia present.  Pulmonary:     Effort: Pulmonary effort is normal.     Breath sounds: Normal breath sounds.  Abdominal:     Palpations: Abdomen is soft.     Tenderness: There is abdominal tenderness. There is no guarding.  Musculoskeletal:     Right Lower Extremity: Right leg is amputated below knee.     Left Lower Extremity: (  4th and 5th digit amputation) Feet:     Left foot:     Skin integrity: Ulcer (lateral hindfoot with some surrounding erythema, no drainage) present.  Neurological:     Mental Status: He is alert.      ED Results / Procedures / Treatments   Labs (all labs ordered are listed, but only abnormal results are displayed) Labs Reviewed  COMPREHENSIVE METABOLIC PANEL - Abnormal; Notable for the following components:      Result Value   Glucose, Bld 352 (*)    BUN 34 (*)    Creatinine, Ser 6.07 (*)    Calcium 8.1 (*)    Total Protein 6.1 (*)    Albumin 3.1 (*)    Alkaline Phosphatase 133 (*)    GFR, Estimated 9 (*)    All other components within normal limits  LACTIC ACID, PLASMA - Abnormal; Notable for the following components:   Lactic Acid, Venous 3.7 (*)    All other components within normal limits  LACTIC ACID, PLASMA - Abnormal; Notable for the following components:   Lactic Acid, Venous 4.1 (*)    All other components within normal limits  CBC WITH DIFFERENTIAL/PLATELET - Abnormal; Notable for the following components:   WBC 13.5 (*)    RBC 3.55 (*)    Hemoglobin 11.3 (*)    HCT 33.0 (*)    Platelets 141 (*)    Neutro Abs 12.2 (*)    Lymphs Abs 0.3 (*)    Abs Immature Granulocytes 0.16 (*)    All  other components within normal limits  PROTIME-INR - Abnormal; Notable for the following components:   Prothrombin Time 16.2 (*)    INR 1.3 (*)    All other components within normal limits  LACTIC ACID, PLASMA - Abnormal; Notable for the following components:   Lactic Acid, Venous 5.2 (*)    All other components within normal limits  I-STAT VENOUS BLOOD GAS, ED - Abnormal; Notable for the following components:   pCO2, Ven 42.7 (*)    Calcium, Ion 1.02 (*)    HCT 30.0 (*)    Hemoglobin 10.2 (*)    All other components within normal limits  RESP PANEL BY RT-PCR (FLU A&B, COVID) ARPGX2  CULTURE, BLOOD (ROUTINE X 2)  CULTURE, BLOOD (ROUTINE X 2)  URINALYSIS, ROUTINE W REFLEX MICROSCOPIC  SEDIMENTATION RATE  C-REACTIVE PROTEIN  PREALBUMIN  HIV ANTIBODY (ROUTINE TESTING W REFLEX)  PROTIME-INR  CORTISOL-AM, BLOOD  PROCALCITONIN  CBC  COMPREHENSIVE METABOLIC PANEL  LACTIC ACID, PLASMA  LACTIC ACID, PLASMA    EKG EKG Interpretation  Date/Time:  Saturday June 23 2022 14:58:28 EDT Ventricular Rate:  109 PR Interval:    QRS Duration: 140 QT Interval:  466 QTC Calculation: 628 R Axis:   -66 Text Interpretation: Junctional tachycardia 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 Clinical concern for sepsis. Diabetes. EXAM: CT CHEST, ABDOMEN AND PELVIS WITHOUT CONTRAST TECHNIQUE: Multidetector CT imaging of the chest, abdomen and pelvis was performed following the standard protocol without IV contrast. RADIATION DOSE REDUCTION: This exam was performed according to the departmental dose-optimization program which includes automated exposure control, adjustment of the mA and/or kV according to patient size and/or use of iterative reconstruction technique. COMPARISON:  None Available. FINDINGS: CT CHEST FINDINGS Cardiovascular:  Enlarged heart. Atheromatous calcifications, including the coronary arteries and aorta.  Mediastinum/Nodes: No enlarged mediastinal, hilar, or axillary lymph nodes. Thyroid gland, trachea, and esophagus demonstrate no significant findings. Lungs/Pleura: Mild bibasilar dependent atelectasis. 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 Lower cervical spine fixation hardware. CT ABDOMEN PELVIS FINDINGS Hepatobiliary: No focal liver abnormality is seen. Status post cholecystectomy. No biliary dilatation. Pancreas: Mild pancreatic atrophy. Spleen: Normal in size without focal abnormality. Adrenals/Urinary Tract: Normal appearing adrenal glands. Stable small exophytic right renal cyst. Unremarkable left kidney, ureters and urinary bladder. Stomach/Bowel: Descending and sigmoid colon diverticula. Mid sigmoid colon anastomosis. Anastomosis involving a dilated distal small bowel loop containing fecalized material. Small appendicoliths in the proximal appendix without evidence of appendicitis. Unremarkable stomach. Vascular/Lymphatic: Atheromatous arterial calcifications without aneurysm. Multiple mildly enlarged left inguinal lymph nodes with central low density. The largest has a short axis diameter of 13 mm on image number 129/3, previously 9 mm. Reproductive: Prostate is unremarkable. Other: Small bilateral inguinal hernias containing fat. 3 supraumbilical ventral hernias containing fat. Midline infraumbilical scar and right lower quadrant anterior scar. Musculoskeletal: Interbody and pedicle screw and rod fusion at the L4 through S1 levels. Lumbar spine degenerative changes. IMPRESSION: 1. Multiple mildly enlarged left inguinal lymph nodes with central low density. This is nonspecific with differential considerations including adenitis, reactive adenopathy and metastatic adenopathy. 2. No acute abnormality in the abdomen or pelvis. 3. Cardiomegaly. 4.  Calcific coronary artery and aortic  atherosclerosis. 5. Colonic diverticulosis. 6. Multiple ventral hernias containing fat. Aortic Atherosclerosis (ICD10-I70.0). Electronically Signed   By: Claudie Revering M.D.   On: 06/23/2022 17:51   CT Foot Left Wo Contrast  Result Date: 06/23/2022 CLINICAL DATA:  Foot ulcer, concern for osteomyelitis EXAM: CT OF THE LEFT FOOT WITHOUT CONTRAST TECHNIQUE: Multidetector CT imaging of the left foot was performed according to the standard protocol. Multiplanar CT image reconstructions were also generated. RADIATION DOSE REDUCTION: This exam was performed according to the departmental dose-optimization program which includes automated exposure control, adjustment of the mA and/or kV according to patient size and/or use of iterative reconstruction technique. COMPARISON:  X-ray 06/23/2022, MRI 01/12/2022 FINDINGS: Bones/Joint/Cartilage Bones are diffusely demineralized. 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 Cortical thickening and sclerosis of the third and fourth metatarsal bases and to a lesser degree within the cuboid compatible with sequela of chronic osteomyelitis. No evidence of a new erosion or aggressive periosteal reaction. No fracture or dislocation. Similar degenerative changes of the foot. No tibiotalar or subtalar joint effusions. Ligaments Suboptimally assessed by CT. Muscles and Tendons Denervation and amputation changes of the musculotendinous structures of the foot. No acute findings. No appreciable tenosynovial fluid collection. Soft tissues Shallow soft tissue ulceration at the lateral aspect of the hindfoot in the region of the calcaneocuboid joint. Soft tissue edema at the lateral and dorsal aspects of the foot. No organized fluid collection. No soft tissue gas. IMPRESSION: 1. Shallow soft tissue ulceration at the lateral aspect of the hindfoot in the region of the calcaneocuboid joint with adjacent cellulitis. 2. Findings of chronic osteomyelitis involving the third and fourth  metatarsal bases and adjacent cuboid. No evidence of a new erosion or aggressive periosteal reaction to suggest acute osteomyelitis by CT. Electronically Signed   By: Davina Poke D.O.   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 No recent fracture  or dislocation is seen. Osteopenia is seen in bony structures. No focal lytic lesions are seen. Plantar spur is seen in calcaneus. Degenerative changes are noted in the intertarsal and tarsometatarsal joints. Arterial calcifications are seen in soft tissues. Overall, no significant interval changes are noted. IMPRESSION: No fracture or dislocation is seen. Postsurgical changes as described in the body of the report. There are no focal lytic lesions. If there is clinical suspicion for osteomyelitis, follow-up MRI may be considered. Electronically Signed   By: Elmer Picker M.D.   On: 06/23/2022 15:47   DG Chest 2 View  Result Date: 06/23/2022 CLINICAL DATA:  Suspected sepsis EXAM: CHEST - 2 VIEW COMPARISON:  01/11/2022 FINDINGS: The heart size and mediastinal contours are within normal limits. Both lungs are clear. The visualized skeletal structures are unremarkable. IMPRESSION: No active cardiopulmonary disease. Electronically Signed   By: Nelson Chimes M.D.   On: 06/23/2022 15:44    Procedures Procedures   Medications Ordered in ED Medications  lactated ringers infusion (has no administration in time range)  cefTRIAXone (ROCEPHIN) 2 g in sodium chloride 0.9 % 100 mL IVPB (has no administration in time range)  metroNIDAZOLE (FLAGYL) tablet 500 mg (has no administration in time range)  acetaminophen (TYLENOL) tablet 650 mg (has no administration in time range)    Or  acetaminophen (TYLENOL) suppository 650 mg (has no administration in time range)   ondansetron (ZOFRAN) tablet 4 mg (has no administration in time range)    Or  ondansetron (ZOFRAN) injection 4 mg (has no administration in time range)  acetaminophen (TYLENOL) tablet 650 mg (650 mg Oral Given 06/23/22 1353)  lactated ringers bolus 500 mL ( Intravenous Stopped 06/23/22 1746)  cefTRIAXone (ROCEPHIN) 1 g in sodium chloride 0.9 % 100 mL IVPB (0 g Intravenous Stopped 06/23/22 1700)  HYDROmorphone (DILAUDID) injection 0.5 mg (0.5 mg Intravenous Given 06/23/22 1842)  lactated ringers bolus 1,000 mL (1,000 mLs Intravenous New Bag/Given 06/23/22 1934)  lactated ringers bolus 1,000 mL (0 mLs Intravenous Stopped 06/23/22 2013)  acetaminophen (TYLENOL) tablet 325 mg (325 mg Oral Given 06/23/22 1905)    ED Course/ Medical Decision Making/ A&P                           Medical Decision Making Amount and/or Complexity of Data Reviewed Labs: ordered. Radiology: ordered.  Risk OTC drugs. Prescription drug management. Decision regarding hospitalization.   Patient presents with generalized weakness. On ED arrival, he is febrile of 103. Labs reveal elevated WBC.  Lactic acid 3.7, repeat 4.1 and 5.2. VBG was normal. CT of left foot showed chronic osteomyelitis of 3rd and 4th metatarsal bases and adjacent cuboid and soft tissue ulceration of hindfoot with adjacent cellulitis. CXR negative for signs of pneumonia. Covid and flu negative. Blood cultures were collected. He was started on empiric antibiotics and IV fluids. On reassessment, he remains febrile with tachycardia and tachypnea. Will need hospital admission for sepsis workup.    Final Clinical Impression(s) / ED Diagnoses Final diagnoses:  Sepsis, due to unspecified organism, unspecified whether acute organ dysfunction present [facility])  Cellulitis of left lower extremity  Chronic osteomyelitis [facility])    Rx / DC Orders ED Discharge Orders     None         Angelique Blonder, DO 06/23/22 2033    Jeanell Sparrow, DO 06/23/22  2040

## 2022-06-23 NOTE — ED Triage Notes (Addendum)
Patient BIB [facility] fom home w/ c/o generalized weakness. Per wife stated at 0300 am started acting confused and not answering questions appropriating. Patient w/ wound on L leg that he was undergoing treatment w/ abx however the treatment has ended.  Patient is a dialysis w/ fistula in left arm. Receives tx TueThursSat- hasn't missed any treatments. Patient is a poor historian and wife not present for further questioning.  Fever 100.6 at home HR-118 BP-124/80 RR-22  CBG 403

## 2022-06-23 NOTE — Assessment & Plan Note (Addendum)
Most likely secondary to his known chronic osteomyelitis and wound of L foot. No evidence of SubQ air, fluid collection, etc on CT. No evidence of other obvious source on CT chest, abd, pelvis. Suspect rising lactates thus far due to delay in getting initial IVF bolus (bolus was still ongoing (almost done) when I saw pt at 9pm it looks like) 1. Sepsis pathway 2. Got 30 cc/kg IVF bolus due to persistent / worsening lactic acidosis. 3. Empiric rocephin, flagyl, vanc 1. H/o MRSA+ 2. ESRD on dialysis 4. Serial lactic acids 5. Tele monitor 6. BP now showing 333 systolic and HR 83-291.

## 2022-06-23 NOTE — Assessment & Plan Note (Addendum)
Chronic PAD. Will defer to [name] if he wants vascular involved. But I presume if there was anything that could have been done to improve blood supply to LE they would have already done so by this point as he has been battling with this for 10+ years now it looks like.

## 2022-06-23 NOTE — Assessment & Plan Note (Signed)
>>  ASSESSMENT AND PLAN FOR PAD (PERIPHERAL ARTERY DISEASE) (HCC) WRITTEN ON 06/23/2022  8:21 PM BY GARDNER, JARED M, DO  Chronic PAD. Will defer to Dr. Harden if he wants vascular involved. But I presume if there was anything that could have been done to improve blood supply to LE they would have already done so by this point as he has been battling with this for 10+ years now it looks like.

## 2022-06-23 NOTE — Progress Notes (Signed)
Elink following for sepsis protocol. 

## 2022-06-23 NOTE — H&P (Signed)
History and Physical    Patient: [name] DOB: 1956-03-12 DOA: 06/23/2022 DOS: the patient was seen and examined [name] 06/23/2022 PCP: [name], PA-C  Patient coming from: Home  Chief Complaint:  Chief Complaint  Patient presents with   Weakness   HPI: [name] is a 66 y.o. male with medical history significant of ESRD [name] TTS dialysis, missed dialysis today.  PAD, HTN, DM2.  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.  R BKA.  He reports URI symptoms for past week. Endorses fever, chills, cough and shortness of breath. Has diffuse abdominal pain and decreased appetite. Denies constipation or diarrhea. He is followed by orthopedics for his left foot ulcer. Last orthopedic visit [name] 10/9 with continued compression dressing, no cellulitis or drainage noted. He is [name] Tue/Thu/Sat dialysis but did not go to his session today.      Review of Systems: As mentioned in the history of present illness. All other systems reviewed and are negative. Past Medical History:  Diagnosis Date   Carotid artery occlusion 11/10/10   LEFT CAROTID ENDARTERECTOMY   Chronic kidney disease    Complication of anesthesia    BP WENT UP AT [facility] "   COPD (chronic obstructive pulmonary disease) ([place])    pt denies this dx as of 06/01/20 - no inhaler    Diabetes mellitus without complication ([place])    Diverticulitis    Diverticulosis of colon (without mention of hemorrhage)    DJD (degenerative joint disease)    knees/hands/feet/back/neck   Fatty liver    Full dentures    GERD (gastroesophageal reflux disease)    H/O hiatal hernia    History of blood transfusion    with a past surical procedure per patient 06/01/20   Hyperlipidemia    Hypertension    Neuromuscular disorder ([place])    peripheral neuropathy   Non-pressure chronic ulcer of other part of left foot limited to breakdown of skin ([place]) 11/12/2016   Osteomyelitis (HCC)    left 5th  metatarsal   PAD (peripheral artery disease) ([place])    Distal aortogram June 2012. Atherectomy left popliteal artery July 2012.    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   Unstable angina ([place]) 09/16/2018   Wears glasses    Past Surgical History:  Procedure Laterality Date   ABDOMINAL AORTOGRAM W/LOWER EXTREMITY N/A 06/23/2021   Procedure: ABDOMINAL AORTOGRAM W/LOWER EXTREMITY;  Surgeon: Nigel Mormon, MD;  Location: [place] [facility];  Service: Cardiovascular;  Laterality: N/A;   AMPUTATION  11/05/2011   Procedure: AMPUTATION RAY;  Surgeon: Wylene Simmer, MD;  Location: [place];  Service: Orthopedics;  Laterality: Right;  Amputation of Right 4&5th Toes   AMPUTATION Left 11/26/2012   Procedure: AMPUTATION RAY;  Surgeon: Wylene Simmer, MD;  Location: [place];  Service: Orthopedics;  Laterality: Left;  fourth ray amputation   AMPUTATION Right 08/27/2014   Procedure: Transmetatarsal Amputation;  Surgeon: Newt Minion, MD;  Location: [place];  Service: Orthopedics;  Laterality: Right;   AMPUTATION Right 01/14/2015   Procedure: AMPUTATION BELOW KNEE;  Surgeon: Newt Minion, MD;  Location: [place];  Service: Orthopedics;  Laterality: Right;   AMPUTATION Left 10/21/2015   Procedure: Left Foot 5th Ray Amputation;  Surgeon: Newt Minion, MD;  Location: [place];  Service: Orthopedics;  Laterality: Left;   ANTERIOR FUSION CERVICAL SPINE  02/06/06   C4-5, C5-6, C6-7; SURGEON DR. MAX COHEN   AV FISTULA PLACEMENT Left 06/02/2020   Procedure: ARTERIOVENOUS (AV) FISTULA CREATION LEFT;  Surgeon: Waynetta Sandy, MD;  Location: [place];  Service: Vascular;  Laterality: Left;   BACK SURGERY     x 3   [place] Left 07/21/2020   Procedure: LEFT UPPER ARM ATERIOVENOUS SUPERFISTULALIZATION;  Surgeon: Waynetta Sandy, MD;  Location: [place];  Service: Vascular;   Laterality: Left;   BELOW KNEE LEG AMPUTATION Right    CARDIAC CATHETERIZATION  10/31/04   2009   CAROTID ENDARTERECTOMY  11/10/10   CAROTID ENDARTERECTOMY Left 11/10/2010   Subtotal occlusion of left internal carotid artery with left hemispheric transient ischemic attacks.   CAROTID STENT     CARPAL TUNNEL RELEASE Right 10/21/2013   Procedure: RIGHT CARPAL TUNNEL RELEASE;  Surgeon: Wynonia Sours, MD;  Location: [place];  Service: Orthopedics;  Laterality: Right;   CHOLECYSTECTOMY     COLON SURGERY     COLONOSCOPY     COLOSTOMY REVERSAL  05/21/2018   ileostomy reversal   CYSTOSCOPY WITH STENT PLACEMENT Bilateral 01/13/2018   Procedure: CYSTOSCOPY WITH BILATERAL URETERAL CATHETER PLACEMENT;  Surgeon: Ardis Hughs, MD;  [facility];  Service: Urology;  Laterality: Bilateral;   ESOPHAGEAL MANOMETRY Bilateral 07/19/2014   Procedure: ESOPHAGEAL MANOMETRY (EM);  Surgeon: Jerene Bears, MD;  [facility];  Service: Gastroenterology;  Laterality: Bilateral;   EYE SURGERY Bilateral 2020   cataract   FEMORAL ARTERY STENT     x6   FINGER SURGERY     FOOT SURGERY  04/25/2016    EXCISION BASE 5TH METATARSAL AND PARTIAL CUBOID LEFT FOOT   HERNIA REPAIR     LEFT INGUINAL AND UMBILICAL REPAIRS   HERNIA REPAIR     I & D EXTREMITY Left 04/25/2016   Procedure: EXCISION BASE 5TH METATARSAL AND PARTIAL CUBOID LEFT FOOT;  Surgeon: Newt Minion, MD;  Location: [facility];  Service: Orthopedics;  Laterality: Left;   ILEOSTOMY  01/13/2018   Procedure: ILEOSTOMY;  Surgeon: Clovis Riley, MD;  [facility];  Service: General;;   ILEOSTOMY CLOSURE N/A 05/21/2018   Procedure: ILEOSTOMY REVERSAL ERAS PATHWAY;  Surgeon: Clovis Riley, MD;  Location: [facility];  Service: General;  Laterality: N/A;   IR RADIOLOGIST EVAL & MGMT  11/19/2017   IR RADIOLOGIST EVAL & MGMT  12/03/2017   IR RADIOLOGIST EVAL & MGMT  12/18/2017   JOINT REPLACEMENT Right 2001   Total knee   LAMINECTOMY     X  3 LUMBAR AND X 2 CERVICAL SPINE OPERATIONS   LAPAROSCOPIC CHOLECYSTECTOMY W/ CHOLANGIOGRAPHY  11/09/04   SURGEON DR. [place] CATH AND CORONARY ANGIOGRAPHY N/A 09/16/2018   Procedure: LEFT HEART CATH AND CORONARY ANGIOGRAPHY;  Surgeon: Nigel Mormon, MD;  Location: [place] [facility];  Service: Cardiovascular;  Laterality: N/A;   LEFT HEART CATHETERIZATION WITH CORONARY ANGIOGRAM N/A 10/29/2014   Procedure: LEFT HEART CATHETERIZATION WITH CORONARY ANGIOGRAM;  Surgeon: Laverda Page, MD;  Location: [facility];  Service: Cardiovascular;  Laterality: N/A;   LIGATION OF COMPETING BRANCHES OF ARTERIOVENOUS FISTULA Left 07/21/2020   Procedure: LIGATION OF COMPETING BRANCHES OF LEFT UPPER ARM ARTERIOVENOUS FISTULA;  Surgeon: Waynetta Sandy, MD;  Location: [place];  Service: Vascular;  Laterality: Left;  LOWER EXTREMITY ANGIOGRAM N/A 03/19/2012   Procedure: LOWER EXTREMITY ANGIOGRAM;  Surgeon: Burnell Blanks, MD;  Location: [facility];  Service: Cardiovascular;  Laterality: N/A;   LOWER EXTREMITY ANGIOGRAPHY N/A 06/20/2021   Procedure: LOWER EXTREMITY ANGIOGRAPHY;  Surgeon: Nigel Mormon, MD;  Location: [place] [facility];  Service: Cardiovascular;  Laterality: N/A;   NECK SURGERY     PARTIAL COLECTOMY N/A 01/13/2018   Procedure: LAPAROSCOPIC ASSISTED   SIGMOID COLECTOMY ILEOSTOMY;  Surgeon: Clovis Riley, MD;  [facility];  Service: General;  Laterality: N/A;   PENILE PROSTHESIS IMPLANT  08/14/05   INFRAPUBIC INSERTION OF INFLATABLE PENILE PROSTHESIS; SURGEON DR. Amalia Hailey   PENILE PROSTHESIS IMPLANT     PERCUTANEOUS CORONARY STENT INTERVENTION (PCI-S) Right 10/29/2014   Procedure: PERCUTANEOUS CORONARY STENT INTERVENTION (PCI-S);  Surgeon: Laverda Page, MD;  Location: [facility] C[facility];  Service: Cardiovascular;  Laterality: Right;   PERIPHERAL VASCULAR INTERVENTION Left 06/23/2021   Procedure: PERIPHERAL VASCULAR INTERVENTION;  Surgeon: Nigel Mormon, MD;  Location: [place] [facility];  Service: Cardiovascular;  Laterality: Left;   REMOVAL OF PENILE PROSTHESIS N/A 06/14/2021   Procedure: Removal of THREE piece inflatable penile prosthesis;  Surgeon: Lucas Mallow, MD;  Location: [facility];  Service: Urology;  Laterality: N/A;   SHOULDER ARTHROSCOPY     SPINE SURGERY     TOE AMPUTATION Left    TONSILLECTOMY     TOTAL KNEE ARTHROPLASTY  07/2002   RIGHT KNEE ; SURGEON  DR. [name] ALSO HAD ARTHROSCOPIC RIGHT KNEE IN  10/2001   TOTAL KNEE ARTHROPLASTY     ULNAR NERVE TRANSPOSITION Right 10/21/2013   Procedure: RIGHT ELBOW  ULNAR NERVE DECOMPRESSION;  Surgeon: Wynonia Sours, MD;  Location: [place];  Service: Orthopedics;  Laterality: Right;   Social History:  reports that he quit smoking about 10 years ago. His smoking use included cigarettes. He has a 70.00 pack-year smoking history. He has never used smokeless tobacco. He reports that he does not currently use alcohol. He reports that he does not use drugs.  Allergies  Allergen Reactions   Contrast Media [Iodinated Contrast Media] Shortness Of Breath and Other (See Comments)    Difficulty breathing and altered mental status     Ivp Dye [Iodinated Contrast Media] Anaphylaxis, Shortness Of Breath and Other (See Comments)    Breathing problems, altered mental state    Adhesive [Tape] Rash and Other (See Comments)    Rash after 1 day of use   Latex Rash and Other (See Comments)    A severe rash appears after the first 24 hours of being placed    Family History  Problem Relation Age of Onset   Heart disease Father        Before age 50-  CAD, BPG   Diabetes Father        Amputation   Cancer Father        PROSTATE   Hyperlipidemia Father    Hypertension Father    Heart attack Father        Triple BPG   Varicose Veins Father    Cancer Sister        Breast   Hyperlipidemia Sister    Hypertension Sister    Heart attack Brother    Colon cancer Brother     Diabetes Brother    Heart disease Brother 88       A-Fib. Before age 17   Hyperlipidemia Brother  Hypertension Brother    Hypertension Son    Arthritis Other        GRANDMOTHER   Hypertension Other        OTHER FAMILY MEMBERS    Prior to Admission medications   Medication Sig Start Date End Date Taking? Authorizing Provider  amiodarone (PACERONE) 200 MG tablet Take 0.5 tablets (100 mg total) by mouth daily. 04/27/22   Adrian Prows, MD  amLODipine (NORVASC) 5 MG tablet Take 5 mg by mouth daily as needed for dizziness. Take one tablet for BP > 160 mm Hg 01/30/21   [provider]  apixaban (ELIQUIS) 5 MG TABS tablet Take 1 tablet (5 mg total) by mouth 2 (two) times daily. 01/14/22 03/15/22  Mercy Riding, MD  atorvastatin (LIPITOR) 40 MG tablet Take 40 mg by mouth at bedtime.     [provider]  B Complex-C-Zn-Folic Acid (DIALYVITE/ZINC) TABS Take 1 tablet by mouth daily. 10/04/21   [provider]  doxycycline (VIBRA-TABS) 100 MG tablet TAKE 1 TABLET BY MOUTH TWICE A DAY 04/02/22   Newt Minion, MD  fentaNYL (DURAGESIC) 50 MCG/HR Place 1 patch onto the skin every 3 (three) days. 09/05/21   Sharen Hones, MD  folic acid (FOLVITE) 1 MG tablet Take 1 mg by mouth daily. 09/18/21   [provider]  Glucosamine HCl (GLUCOSAMINE PO) Take 1 tablet by mouth 2 (two) times daily.    [provider]  HUMALOG KWIKPEN 100 UNIT/ML KwikPen Inject 4 Units into the skin 3 (three) times daily. Patient taking differently: Inject 13 Units into the skin 3 (three) times daily. 09/05/21   Sharen Hones, MD  insulin glargine (LANTUS) 100 UNIT/ML injection Inject 0.1 mLs (10 Units total) into the skin at bedtime. Patient taking differently: Inject 30 Units into the skin at bedtime. 06/30/21   Antonieta Pert, MD  ketoconazole (NIZORAL) 2 % cream Apply 1 application topically 2 (two) times daily as needed for irritation.  03/07/18   [provider]  lidocaine-prilocaine (EMLA)  cream Apply 1 application topically See admin instructions. Prior to Dialysis days Tuesday,Thursday and saturday 06/06/21   [provider]  linaclotide Rolan Lipa) 145 MCG CAPS capsule Take 145 mcg by mouth daily as needed (constipation).     [provider]  loratadine (CLARITIN) 10 MG tablet Take 10 mg by mouth daily.    [provider]  metoCLOPramide (REGLAN) 5 MG tablet Take 1 tablet (5 mg total) by mouth 4 (four) times daily -  before meals and at bedtime. Patient taking differently: Take 5 mg by mouth 3 (three) times daily before meals. 01/14/22 07/13/22  Mercy Riding, MD  midodrine (PROAMATINE) 10 MG tablet Take 1 tablet (10 mg total) by mouth every other day. One hour before dialysis 09/07/21   Barb Merino, MD  NEEDLE, REUSABLE, 22 G 22G X 1-1/2" MISC 1 Units by Does not apply route as directed. For B12 IM inj 02/15/18   Elgergawy, Silver Huguenin, MD  nitroGLYCERIN (NITRODUR - DOSED IN MG/24 HR) 0.2 mg/hr patch Place 1 patch (0.2 mg total) onto the skin daily. 05/21/22   Newt Minion, MD  nitroGLYCERIN (NITROSTAT) 0.4 MG SL tablet Place 0.4 mg under the tongue every 5 (five) minutes as needed for chest pain. 01/06/22   [provider]  omega-3 acid ethyl esters (LOVAZA) 1 g capsule Take 2 g by mouth 2 (two) times daily.    [provider]  ondansetron (ZOFRAN) 4 MG tablet Take 4  mg by mouth every 6 (six) hours as needed for nausea or vomiting.    [provider]  oxyCODONE-acetaminophen (PERCOCET) 7.5-325 MG tablet Take 1 tablet by mouth 3 (three) times daily as needed for moderate pain. Patient taking differently: Take 1 tablet by mouth every 8 (eight) hours. 09/05/21   Sharen Hones, MD  pantoprazole (PROTONIX) 40 MG tablet Take 1 tablet (40 mg total) by mouth 2 (two) times daily for 30 days, THEN 1 tablet (40 mg total) daily. 01/14/22 04/27/22  Mercy Riding, MD  Polyvinyl Alcohol-Povidone PF 1.4-0.6 % SOLN Place 1 drop into both eyes 2 (two) times  daily as needed (dry eyes).     [provider]  pregabalin (LYRICA) 100 MG capsule Take 100 mg by mouth 2 (two) times daily. 09/18/21   [provider]  rOPINIRole (REQUIP) 2 MG tablet Take 2 mg by mouth at bedtime.    [provider]  sevelamer carbonate (RENVELA) 800 MG tablet Take 800 mg by mouth 3 (three) times daily. 12/17/21   [provider]  silver sulfADIAZINE (SILVADENE) 1 % cream Apply topically daily. 01/14/22   Mercy Riding, MD  Syringe/Needle, Disp, (SYRINGE 3CC/22GX1-1/2") 22G X 1-1/2" 3 ML MISC 1 Syringe by Does not apply route as directed. For b12 IM inj 02/15/18   Elgergawy, Silver Huguenin, MD  tamsulosin (FLOMAX) 0.4 MG CAPS capsule Take 0.4 mg by mouth daily.    [provider]    Physical Exam: Vitals:   06/23/22 1848 06/23/22 1850 06/23/22 1930 06/23/22 1935  BP:  (!) 198/58  (!) 172/77  Pulse:  (!) 107  (!) 57  Resp:  (!) 23  (!) 23  Temp:  (!) 102.5 F (39.2 C)    TempSrc:  Oral    SpO2: 98% 98% 96%    Constitutional: Ill appearing Eyes: PERRL, lids and conjunctivae normal ENMT: Mucous membranes are moist. Posterior pharynx clear of any exudate or lesions.Normal dentition.  Neck: normal, supple, no masses, no thyromegaly Respiratory: clear to auscultation bilaterally, no wheezing, no crackles. Normal respiratory effort. No accessory muscle use.  Cardiovascular: Regular rate and rhythm, no murmurs / rubs / gallops. No extremity edema. 2+ pedal pulses. No carotid bruits.  Abdomen: TTP no guarding Skin:  Neurologic: CN 2-12 grossly intact. Sensation intact, DTR normal. Strength 5/5 in all 4.  Psychiatric: Normal judgment and insight. Alert and oriented x 3. Normal mood.   Data Reviewed:       Latest Ref Rng & Units 06/23/2022    4:46 PM 06/23/2022    2:04 PM 02/14/2022   12:35 PM  CBC  WBC 4.0 - 10.5 K/uL  13.5  5.0   Hemoglobin 13.0 - 17.0 g/dL 10.2  11.3  10.8   Hematocrit 39.0 - 52.0 % 30.0  33.0  32.1   Platelets 150 -  400 K/uL  141  142       Latest Ref Rng & Units 06/23/2022    4:46 PM 06/23/2022    2:04 PM 02/14/2022   12:35 PM  CMP  Glucose 70 - 99 mg/dL  352  244   BUN 8 - 23 mg/dL  34  26   Creatinine 0.61 - 1.24 mg/dL  6.07  6.26   Sodium 135 - 145 mmol/L 137  139  136   Potassium 3.5 - 5.1 mmol/L 4.4  4.1  4.2   Chloride 98 - 111 mmol/L  100  95   CO2 22 - 32 mmol/L  25  29   Calcium 8.9 - 10.3 mg/dL  8.1  7.8   Total Protein 6.5 - 8.1 g/dL  6.1  6.0   Total Bilirubin 0.3 - 1.2 mg/dL  0.9  0.6   Alkaline Phos 38 - 126 U/L  133  114   AST 15 - 41 U/L  20  16   ALT 0 - 44 U/L  16  15    CT chest, abd, pelvis = no obvious source of infection  Lactates: 3.7 -> 4.1 -> 5.2  Assessment and Plan: * Severe sepsis with lactic acidosis (HCC) Most likely secondary to his known chronic osteomyelitis and wound of L foot. No evidence of SubQ air, fluid collection, etc [name] CT. No evidence of other obvious source [name] CT chest, abd, pelvis. Suspect rising lactates thus far due to delay in getting initial IVF bolus (bolus was still ongoing (almost done) when I saw pt at 9pm it looks like) Sepsis pathway Got 30 cc/kg IVF bolus due to persistent / worsening lactic acidosis. Empiric rocephin, flagyl, vanc H/o MRSA+ ESRD [name] dialysis Serial lactic acids Tele monitor BP now showing 500 systolic and HR 93-818.  Infected diabetic left foot ulcer with chronic osteomyelitis Has chronic osteo with this. No evidence of acute osteo. No subQ air, fluid collection, etc [name] CT today. Does have reactive lymphnodes in L groin area. LE wound pathway Call [name] in AM  Decubitus ulcer of buttock Wound care consult. No mention of severe cellulitis / NSTI findings [name] CT scan today.  ESRD [name] hemodialysis [facility]) Missed dialysis today, but no other missed sessions. No emergent indications at present but did get LARGE IVF bolus in ED due to sepsis with rising lactic acidosis. Call nephrology in AM  Essential  hypertension Will hold off [name] ordering home BP meds for the moment and see what his BPs do overnight in setting of severe sepsis. However, currently SBP 170s so may need to resume amlodipine and nitro patch during regularly scheduled time in AM.  Atrial fibrillation, chronic (HCC) Tele monitor Cont eliquis Cont amiodarone  PAD (peripheral artery disease) (HCC) Chronic PAD. Will defer to [name] if he wants vascular involved. But I presume if there was anything that could have been done to improve blood supply to LE they would have already done so by this point as he has been battling with this for 10+ years now it looks like.  Insulin dependent type 2 diabetes mellitus ([place]) Continue home Lantus at slightly reduced dose. SSI sensitive AC  Chronic diastolic heart failure, NYHA class 2 ([place]) Having to give IVF bolus for sepsis with rising lactic acid.  Watch for development of fluid overload.  CAD (dz of distal, mid and proximal RCA with implantation of 3 overlapping drug-eluting stent,) Continue statin.      Advance Care Planning:   Code Status: Full Code  Consults: None  Family Communication: No family in room  Severity of Illness: The appropriate patient status for this patient is INPATIENT. Inpatient status is judged to be reasonable and necessary in order to provide the required intensity of service to ensure the patient's safety. The patient's presenting symptoms, physical exam findings, and initial radiographic and laboratory data in the context of their chronic comorbidities is felt to place them at high risk for further clinical deterioration. Furthermore, it is not anticipated that the patient will be medically stable for discharge from the hospital within 2 midnights of admission.   * I certify  that at the point of admission it is my clinical judgment that the patient will require inpatient hospital care spanning beyond 2 midnights from the point of admission due to high  intensity of service, high risk for further deterioration and high frequency of surveillance required.*  Author: Etta Quill., DO 06/23/2022 8:25 PM  For [name] call review [email or web address].

## 2022-06-23 NOTE — Assessment & Plan Note (Signed)
Has chronic osteo with this. No evidence of acute osteo. No subQ air, fluid collection, etc on CT today. Does have reactive lymphnodes in L groin area. 1. LE wound pathway 2. Call [name] in AM

## 2022-06-23 NOTE — Assessment & Plan Note (Signed)
Continue statin. 

## 2022-06-23 NOTE — Assessment & Plan Note (Signed)
Missed dialysis today, but no other missed sessions. No emergent indications at present but did get LARGE IVF bolus in ED due to sepsis with rising lactic acidosis. 1. Call nephrology in AM

## 2022-06-23 NOTE — Assessment & Plan Note (Signed)
Will hold off on ordering home BP meds for the moment and see what his BPs do overnight in setting of severe sepsis. However, currently SBP 170s so may need to resume amlodipine and nitro patch during regularly scheduled time in AM.

## 2022-06-23 NOTE — Assessment & Plan Note (Addendum)
Continue home Lantus at slightly reduced dose. SSI sensitive AC

## 2022-06-23 NOTE — Assessment & Plan Note (Signed)
Tele monitor Cont eliquis Cont amiodarone

## 2022-06-23 NOTE — ED Notes (Addendum)
Report given and care endorsed to [employer].

## 2022-06-24 DIAGNOSIS — L03116 Cellulitis of left lower limb: Secondary | ICD-10-CM | POA: Diagnosis not present

## 2022-06-24 DIAGNOSIS — I5032 Chronic diastolic (congestive) heart failure: Secondary | ICD-10-CM | POA: Diagnosis not present

## 2022-06-24 DIAGNOSIS — A419 Sepsis, unspecified organism: Secondary | ICD-10-CM | POA: Diagnosis not present

## 2022-06-24 DIAGNOSIS — I482 Chronic atrial fibrillation, unspecified: Secondary | ICD-10-CM | POA: Diagnosis not present

## 2022-06-24 LAB — CBG MONITORING, ED
Glucose-Capillary: 166 mg/dL — ABNORMAL HIGH (ref 70–99)
Glucose-Capillary: 162 mg/dL — ABNORMAL HIGH (ref 70–99)
Glucose-Capillary: 198 mg/dL — ABNORMAL HIGH (ref 70–99)
Glucose-Capillary: 177 mg/dL — ABNORMAL HIGH (ref 70–99)

## 2022-06-24 LAB — HEPATITIS B SURFACE ANTIBODY,QUALITATIVE: Hep B S Ab: REACTIVE — AB

## 2022-06-24 LAB — COMPREHENSIVE METABOLIC PANEL
ALT: 14 U/L (ref 0–44)
AST: 23 U/L (ref 15–41)
Albumin: 2.4 g/dL — ABNORMAL LOW (ref 3.5–5.0)
Alkaline Phosphatase: 94 U/L (ref 38–126)
Anion gap: 15 (ref 5–15)
BUN: 35 mg/dL — ABNORMAL HIGH (ref 8–23)
CO2: 21 mmol/L — ABNORMAL LOW (ref 22–32)
Calcium: 7.8 mg/dL — ABNORMAL LOW (ref 8.9–10.3)
Chloride: 103 mmol/L (ref 98–111)
Creatinine, Ser: 6.16 mg/dL — ABNORMAL HIGH (ref 0.61–1.24)
GFR, Estimated: 9 mL/min — ABNORMAL LOW (ref >60)
Glucose, Bld: 196 mg/dL — ABNORMAL HIGH (ref 70–99)
Potassium: 4.2 mmol/L (ref 3.5–5.1)
Sodium: 139 mmol/L (ref 135–145)
Total Bilirubin: 0.9 mg/dL (ref 0.3–1.2)
Total Protein: 5 g/dL — ABNORMAL LOW (ref 6.5–8.1)

## 2022-06-24 LAB — BLOOD CULTURE ID PANEL (REFLEXED) - BCID2

## 2022-06-24 LAB — PROTIME-INR
INR: 1.5 — ABNORMAL HIGH (ref 0.8–1.2)
Prothrombin Time: 18.2 seconds — ABNORMAL HIGH (ref 11.4–15.2)

## 2022-06-24 LAB — CBC
HCT: 28.7 % — ABNORMAL LOW (ref 39.0–52.0)
Hemoglobin: 9.5 g/dL — ABNORMAL LOW (ref 13.0–17.0)
MCH: 31.4 pg (ref 26.0–34.0)
MCHC: 33.1 g/dL (ref 30.0–36.0)
MCV: 94.7 fL (ref 80.0–100.0)
Platelets: 134 10*3/uL — ABNORMAL LOW (ref 150–400)
RBC: 3.03 MIL/uL — ABNORMAL LOW (ref 4.22–5.81)
RDW: 13.9 % (ref 11.5–15.5)
WBC: 13.4 10*3/uL — ABNORMAL HIGH (ref 4.0–10.5)
nRBC: 0 % (ref 0.0–0.2)

## 2022-06-24 LAB — HIV ANTIBODY (ROUTINE TESTING W REFLEX): HIV Screen 4th Generation wRfx: NONREACTIVE

## 2022-06-24 LAB — LACTIC ACID, PLASMA: Lactic Acid, Venous: 3.7 mmol/L (ref 0.5–1.9)

## 2022-06-24 LAB — HEPATITIS C ANTIBODY: HCV Ab: NONREACTIVE

## 2022-06-24 LAB — CORTISOL-AM, BLOOD: Cortisol - AM: 28.7 ug/dL — ABNORMAL HIGH (ref 6.7–22.6)

## 2022-06-24 LAB — HEPATITIS B SURFACE ANTIGEN: Hepatitis B Surface Ag: NONREACTIVE

## 2022-06-24 LAB — GLUCOSE, CAPILLARY: Glucose-Capillary: 192 mg/dL — ABNORMAL HIGH (ref 70–99)

## 2022-06-24 LAB — PHOSPHORUS: Phosphorus: 4.7 mg/dL — ABNORMAL HIGH (ref 2.5–4.6)

## 2022-06-24 LAB — PROCALCITONIN: Procalcitonin: 35.63 ng/mL

## 2022-06-24 LAB — HEPATITIS B CORE ANTIBODY, TOTAL: Hep B Core Total Ab: NONREACTIVE

## 2022-06-24 MED ORDER — PENICILLIN G POTASSIUM 5000000 UNITS IJ SOLR
1.0000 10*6.[IU] | Freq: Four times a day (QID) | INTRAVENOUS | Status: DC
  Administered 2022-06-24 – 2022-06-25 (×3): 1 10*6.[IU] via INTRAVENOUS
  Filled 2022-06-24 (×7): qty 1

## 2022-06-24 MED ORDER — NITROGLYCERIN 0.2 MG/HR TD PT24
0.2000 mg | MEDICATED_PATCH | Freq: Every day | TRANSDERMAL | Status: DC
  Administered 2022-06-24 – 2022-06-29 (×6): 0.2 mg via TRANSDERMAL
  Filled 2022-06-24 (×6): qty 1

## 2022-06-24 MED ORDER — GERHARDT'S BUTT CREAM
TOPICAL_CREAM | Freq: Three times a day (TID) | CUTANEOUS | Status: DC
  Administered 2022-06-28: 1 via TOPICAL
  Filled 2022-06-24: qty 1

## 2022-06-24 MED ORDER — ORAL CARE MOUTH RINSE: 15.0000 mL | OROMUCOSAL | Status: DC | PRN

## 2022-06-24 MED ORDER — CHLORHEXIDINE GLUCONATE CLOTH 2 % EX PADS
6.0000 | MEDICATED_PAD | Freq: Every day | CUTANEOUS | Status: DC
  Administered 2022-06-25 – 2022-06-28 (×4): 6 via TOPICAL

## 2022-06-24 MED ORDER — DOXERCALCIFEROL 4 MCG/2ML IV SOLN: 2.0000 ug | INTRAVENOUS | Status: DC

## 2022-06-24 NOTE — Consult Note (Addendum)
WOC Nurse Consult Note: Reason for Consult:Left lateral foot wound and IT and sacral skin loss, partial thickness (Stage 2). Patient seen and followed by [name] (Orthopedics) for this wound, last seen by that Provider on 06/18/22. I will continue the topical POC.Consult performed remotely following review of the medical record including photographs. Wound type:full thickness, mixed etiology Pressure Injury POA: Yes Measurement:Per [name] on 10/9, 2cm in diameter, 0.2cm depth Wound QTM:AUQJF uploaded to EMR on admission, red, moist Drainage (amount, consistency, odor) small serous Periwound:intact, dry Dressing procedure/placement/frequency: I will continue the topical care for the wound using a silver hydrofiber placed today and then M/W/F and also use compression-either by the application a a compression knee-high compression sock or using a "dry boot" (Kerlix wrapped from just below toes to just below knees and topped with an ACE bandage wrapped in a similar manner.    Of note: [name] additionally has the patient using a nitroglycerin patch to the periwound area with dressing changes. As that is a prescriptive intervention, I did not order, but I recommend discussing with Dr [name] and if you agree, continuing with that aspect of the POC.  A sacral silicone foam dressing is ordered for PI prevention while in house. Topical application of Gerhart's Butt Cream, a 1:1:1 compounded preparation consisting of zinc oxide:hydrocortisone cream:lotrimin cream is to be applied to the IT, buttock and sacral areas three times daily and PRN inadvertent removal..  Patient has an altered seated posture due to amputation on right. Will provide a pressure redistribution chair cushion for his use in chair. Please send home with patient.  [place] nursing team will not follow, but will remain available to this patient, the nursing and medical teams.  Please re-consult if needed.  Thank you for inviting [place] to  participate in this patient's Plan of Care.  Maudie Flakes, MSN, RN, CNS, [place], Serita Grammes, [employer], [employer] phone:  [phone]

## 2022-06-24 NOTE — Inpatient Diabetes Management (Signed)
Inpatient Diabetes Program Recommendations  AACE/ADA: New Consensus Statement on Inpatient Glycemic Control (2015)  Target Ranges:  Prepandial:   less than 140 mg/dL      Peak postprandial:   less than 180 mg/dL (1-2 hours)      Critically ill patients:  140 - 180 mg/dL   Lab Results  Component Value Date   GLUCAP 198 (H) 06/24/2022   HGBA1C 7.6 (H) 01/11/2022    Review of Glycemic Control  Diabetes history: DM2 Outpatient Diabetes medications: Lantus 30 QHS, Novolog 13 units TID Current orders for Inpatient glycemic control: Semglee 15 units QHS, Novolog 0-9 units TID  HgbA1C - 7.6%  Inpatient Diabetes Program Recommendations:    Consider adding Novolog 3 units TID with meals if FBS > 180.  Continue to follow.  Thank you. Lorenda Peck, RD, LDN, [place] Inpatient Diabetes Coordinator [phone]

## 2022-06-24 NOTE — ED Notes (Signed)
Pt moved to hospital bed. Sacral patch applied.

## 2022-06-24 NOTE — Evaluation (Signed)
Clinical/Bedside Swallow Evaluation Patient Details  Name: [name] MRN: [id number] Date of Birth: 1955/10/03  Today's Date: 06/24/2022 Time: SLP Start Time (ACUTE ONLY): 5027 SLP Stop Time (ACUTE ONLY): 7412 SLP Time Calculation (min) (ACUTE ONLY): 20 min  Past Medical History:  Past Medical History:  Diagnosis Date   Carotid artery occlusion 11/10/10   LEFT CAROTID ENDARTERECTOMY   Chronic kidney disease    Complication of anesthesia    BP WENT UP AT [facility] "   COPD (chronic obstructive pulmonary disease) ([place])    pt denies this dx as of 06/01/20 - no inhaler    Diabetes mellitus without complication ([place])    Diverticulitis    Diverticulosis of colon (without mention of hemorrhage)    DJD (degenerative joint disease)    knees/hands/feet/back/neck   Fatty liver    Full dentures    GERD (gastroesophageal reflux disease)    H/O hiatal hernia    History of blood transfusion    with a past surical procedure per patient 06/01/20   Hyperlipidemia    Hypertension    Neuromuscular disorder ([place])    peripheral neuropathy   Non-pressure chronic ulcer of other part of left foot limited to breakdown of skin ([place]) 11/12/2016   Osteomyelitis ([facility])    left 5th metatarsal   PAD (peripheral artery disease) ([place])    Distal aortogram June 2012. Atherectomy left popliteal artery July 2012.    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   Unstable angina ([place]) 09/16/2018   Wears glasses    Past Surgical History:  Past Surgical History:  Procedure Laterality Date   ABDOMINAL AORTOGRAM W/LOWER EXTREMITY N/A 06/23/2021   Procedure: ABDOMINAL AORTOGRAM W/LOWER EXTREMITY;  Surgeon: Nigel Mormon, MD;  Location: [place] [facility];  Service: Cardiovascular;  Laterality: N/A;   AMPUTATION  11/05/2011   Procedure: AMPUTATION RAY;  Surgeon: Wylene Simmer, MD;  Location: [place];   Service: Orthopedics;  Laterality: Right;  Amputation of Right 4&5th Toes   AMPUTATION Left 11/26/2012   Procedure: AMPUTATION RAY;  Surgeon: Wylene Simmer, MD;  Location: [place];  Service: Orthopedics;  Laterality: Left;  fourth ray amputation   AMPUTATION Right 08/27/2014   Procedure: Transmetatarsal Amputation;  Surgeon: Newt Minion, MD;  Location: [place];  Service: Orthopedics;  Laterality: Right;   AMPUTATION Right 01/14/2015   Procedure: AMPUTATION BELOW KNEE;  Surgeon: Newt Minion, MD;  Location: [place];  Service: Orthopedics;  Laterality: Right;   AMPUTATION Left 10/21/2015   Procedure: Left Foot 5th Ray Amputation;  Surgeon: Newt Minion, MD;  Location: [place];  Service: Orthopedics;  Laterality: Left;   ANTERIOR FUSION CERVICAL SPINE  02/06/06   C4-5, C5-6, C6-7; SURGEON DR. MAX COHEN   AV FISTULA PLACEMENT Left 06/02/2020   Procedure: ARTERIOVENOUS (AV) FISTULA CREATION LEFT;  Surgeon: Waynetta Sandy, MD;  Location: [place];  Service: Vascular;  Laterality: Left;   BACK SURGERY     x 3   [place] Left 07/21/2020   Procedure: LEFT UPPER ARM ATERIOVENOUS SUPERFISTULALIZATION;  Surgeon: Waynetta Sandy, MD;  Location: [place];  Service: Vascular;  Laterality: Left;   BELOW KNEE LEG AMPUTATION Right    CARDIAC CATHETERIZATION  10/31/04   2009   CAROTID ENDARTERECTOMY  11/10/10   CAROTID ENDARTERECTOMY Left 11/10/2010  Subtotal occlusion of left internal carotid artery with left hemispheric transient ischemic attacks.   CAROTID STENT     CARPAL TUNNEL RELEASE Right 10/21/2013   Procedure: RIGHT CARPAL TUNNEL RELEASE;  Surgeon: Wynonia Sours, MD;  Location: [place];  Service: Orthopedics;  Laterality: Right;   CHOLECYSTECTOMY     COLON SURGERY     COLONOSCOPY     COLOSTOMY REVERSAL  05/21/2018   ileostomy reversal   CYSTOSCOPY WITH STENT PLACEMENT Bilateral 01/13/2018   Procedure: CYSTOSCOPY WITH BILATERAL URETERAL CATHETER PLACEMENT;   Surgeon: Ardis Hughs, MD;  Location: [facility];  Service: Urology;  Laterality: Bilateral;   ESOPHAGEAL MANOMETRY Bilateral 07/19/2014   Procedure: ESOPHAGEAL MANOMETRY (EM);  Surgeon: Jerene Bears, MD;  Location[facility];  Service: Gastroenterology;  Laterality: Bilateral;   EYE SURGERY Bilateral 2020   cataract   FEMORAL ARTERY STENT     x6   FINGER SURGERY     FOOT SURGERY  04/25/2016    EXCISION BASE 5TH METATARSAL AND PARTIAL CUBOID LEFT FOOT   HERNIA REPAIR     LEFT INGUINAL AND UMBILICAL REPAIRS   HERNIA REPAIR     I & D EXTREMITY Left 04/25/2016   Procedure: EXCISION BASE 5TH METATARSAL AND PARTIAL CUBOID LEFT FOOT;  Surgeon: Newt Minion, MD;  Location: [facility];  Service: Orthopedics;  Laterality: Left;   ILEOSTOMY  01/13/2018   Procedure: ILEOSTOMY;  Surgeon: Clovis Riley, MD;  Location: [facility];  Service: General;;   ILEOSTOMY CLOSURE N/A 05/21/2018   Procedure: ILEOSTOMY REVERSAL ERAS PATHWAY;  Surgeon: Clovis Riley, MD;  Location: [place];  Service: General;  Laterality: N/A;   IR RADIOLOGIST EVAL & MGMT  11/19/2017   IR RADIOLOGIST EVAL & MGMT  12/03/2017   IR RADIOLOGIST EVAL & MGMT  12/18/2017   JOINT REPLACEMENT Right 2001   Total knee   LAMINECTOMY     X 3 LUMBAR AND X 2 CERVICAL SPINE OPERATIONS   LAPAROSCOPIC CHOLECYSTECTOMY W/ CHOLANGIOGRAPHY  11/09/04   SURGEON DR. [place] CATH AND CORONARY ANGIOGRAPHY N/A 09/16/2018   Procedure: LEFT HEART CATH AND CORONARY ANGIOGRAPHY;  Surgeon: Nigel Mormon, MD;  Location: [place] [facility];  Service: Cardiovascular;  Laterality: N/A;   LEFT HEART CATHETERIZATION WITH CORONARY ANGIOGRAM N/A 10/29/2014   Procedure: LEFT HEART CATHETERIZATION WITH CORONARY ANGIOGRAM;  Surgeon: Laverda Page, MD;  Location: [facility];  Service: Cardiovascular;  Laterality: N/A;   LIGATION OF COMPETING BRANCHES OF ARTERIOVENOUS FISTULA Left 07/21/2020   Procedure: LIGATION OF COMPETING BRANCHES OF LEFT UPPER  ARM ARTERIOVENOUS FISTULA;  Surgeon: Waynetta Sandy, MD;  Location: [place];  Service: Vascular;  Laterality: Left;   LOWER EXTREMITY ANGIOGRAM N/A 03/19/2012   Procedure: LOWER EXTREMITY ANGIOGRAM;  Surgeon: Burnell Blanks, MD;  Location: [facility];  Service: Cardiovascular;  Laterality: N/A;   LOWER EXTREMITY ANGIOGRAPHY N/A 06/20/2021   Procedure: LOWER EXTREMITY ANGIOGRAPHY;  Surgeon: Nigel Mormon, MD;  Location: [place] [facility];  Service: Cardiovascular;  Laterality: N/A;   NECK SURGERY     PARTIAL COLECTOMY N/A 01/13/2018   Procedure: LAPAROSCOPIC ASSISTED   SIGMOID COLECTOMY ILEOSTOMY;  Surgeon: Clovis Riley, MD;  Location: [facility];  Service: General;  Laterality: N/A;   PENILE PROSTHESIS IMPLANT  08/14/05   INFRAPUBIC INSERTION OF INFLATABLE PENILE PROSTHESIS; SURGEON DR. Amalia Hailey   PENILE PROSTHESIS IMPLANT     PERCUTANEOUS CORONARY STENT INTERVENTION (  PCI-S) Right 10/29/2014   Procedure: PERCUTANEOUS CORONARY STENT INTERVENTION (PCI-S);  Surgeon: Laverda Page, MD;  Location: [facility];  Service: Cardiovascular;  Laterality: Right;   PERIPHERAL VASCULAR INTERVENTION Left 06/23/2021   Procedure: PERIPHERAL VASCULAR INTERVENTION;  Surgeon: Nigel Mormon, MD;  Location: [place] [facility];  Service: Cardiovascular;  Laterality: Left;   REMOVAL OF PENILE PROSTHESIS N/A 06/14/2021   Procedure: Removal of THREE piece inflatable penile prosthesis;  Surgeon: Lucas Mallow, MD;  Location: [place];  Service: Urology;  Laterality: N/A;   SHOULDER ARTHROSCOPY     SPINE SURGERY     TOE AMPUTATION Left    TONSILLECTOMY     TOTAL KNEE ARTHROPLASTY  07/2002   RIGHT KNEE ; SURGEON  DR. [name] ALSO HAD ARTHROSCOPIC RIGHT KNEE IN  10/2001   TOTAL KNEE ARTHROPLASTY     ULNAR NERVE TRANSPOSITION Right 10/21/2013   Procedure: RIGHT ELBOW  ULNAR NERVE DECOMPRESSION;  Surgeon: Wynonia Sours, MD;  Location: [place];  Service: Orthopedics;   Laterality: Right;   HPI:  Patient is a 60 y.Marland Kitcheno. male with PMH: COPD, DJD, DM-2, CKD, on ESRD, HTN, GERD, hiatal hernia. He presented to the hospital on 06/23/2022 with fever and weakness, found to have severe sepsis. CXR did not show any PNA; CT chest/abdomen/pelvis: No acute abnormality, mildly enlarged left inguinal lymph nodes.    Assessment / Plan / Recommendation  Clinical Impression  Patient not currently presenting with clinical s/s of an oral or pharyngeal phase dysphagia but per his report and description of symptoms, suspect he is exhbiting a primary esophageal dysphagia. Patient reports that he does not have difficulty while eating but after, "when I'm not eating" he will have globus sensation. SLP observed patient with PO intake at lunch meal (baked chicken, roll, applesauce, noodles. He did not exhibit any overt s/s aspiration or penetration and did not endorse any dysphagia symptoms. SLP suspects patient's esophageal is chronic and likely not significantly changed from baseline. No further SLP skilled intervention needed at this time but please reorder if any new concerns regarding his swallow function. SLP Visit Diagnosis: Dysphagia, unspecified (R13.10)    Aspiration Risk  No limitations;Mild aspiration risk    Diet Recommendation Regular;Thin liquid   Liquid Administration via: Cup;Straw Medication Administration: Whole meds with liquid Supervision: Patient able to self feed Compensations: Slow rate;Small sips/bites Postural Changes: Seated upright at 90 degrees;Remain upright for at least 30 minutes after po intake    Other  Recommendations Oral Care Recommendations: Oral care BID    Recommendations for follow up therapy are one component of a multi-disciplinary discharge planning process, led by the attending physician.  Recommendations may be updated based on patient status, additional functional criteria and insurance authorization.  Follow up Recommendations No SLP  follow up      Assistance Recommended at Discharge None  Functional Status Assessment Patient has not had a recent decline in their functional status  Frequency and Duration   N/A        Prognosis   N/A     Swallow Study   General Date of Onset: 06/23/22 HPI: Patient is a 21 y.Marland Kitcheno. male with PMH: COPD, DJD, DM-2, CKD, on ESRD, HTN, GERD, hiatal hernia. He presented to the hospital on 06/23/2022 with fever and weakness, found to have severe sepsis. CXR did not show any PNA; CT chest/abdomen/pelvis: No acute abnormality, mildly enlarged left inguinal lymph nodes. Type of Study: Bedside Swallow Evaluation Previous Swallow  Assessment: none found Diet Prior to this Study: Regular;Thin liquids Temperature Spikes Noted: No Respiratory Status: Room air History of Recent Intubation: No Behavior/Cognition: Alert;Cooperative;Pleasant mood Oral Cavity Assessment: Within Functional Limits Oral Care Completed by SLP: No Oral Cavity - Dentition: Dentures, top;Edentulous;Other (Comment) (reports that his bottom dentures do not fit well) Vision: Functional for self-feeding Self-Feeding Abilities: Able to feed self Patient Positioning: Upright in bed Baseline Vocal Quality: Normal Volitional Cough: Strong Volitional Swallow: Able to elicit    Oral/Motor/Sensory Function Overall Oral Motor/Sensory Function: Within functional limits   Ice Chips     Thin Liquid Thin Liquid: Within functional limits Presentation: Straw;Self Fed    Nectar Thick     Honey Thick     Puree Puree: Not tested   Solid     Solid: Within functional limits Presentation: [place], MA, CCC-SLP Speech Therapy

## 2022-06-24 NOTE — Progress Notes (Signed)
PROGRESS NOTE        PATIENT DETAILS Name: [name] Age: 66 y.o. Sex: male Date of Birth: 1956/02/25 Admit Date: 06/23/2022 Admitting Physician Etta Quill, DO YSA:[id number], Ovid Curd, PA-C  Brief Summary: Patient is a 66 y.o.  male with history of ESRD on HD TTS, DM-2, HTN, PAD-s/p right BKA and ray amputation of left foot, chronic left foot ischemic ulcer with underlying osteomyelitis (previously refused BKA per prior notes)-presented with fever, weakness-found to have severe sepsis and subsequently admitted to the hospitalist service.  See below for further details.  Significant events: 10/14>> admit to [facility]-sepsis-suspected to be due to possible ongoing osteomyelitis of left foot.  Significant studies: 10/14>> CXR: No PNA 10/14>> CT chest/abdomen/pelvis: No acute abnormality, mildly enlarged left inguinal lymph nodes. 10/14>> CT left foot: Shallow soft tissue ulceration at the lateral aspect of the hindfoot with adjacent cellulitis.  Findings of chronic osteomyelitis involving the third/fourth metatarsal bases/adjacent cuboid.  No evidence of new erosion or aggressive periosteal reaction to suggest acute osteomyelitis.  Significant microbiology data: 10/14>> COVID PCR/influenza PCR: Negative 10/14>> blood culture: Pending  Procedures: None  Consults: None  Subjective: Sleeping comfortably-easily aroused-feels weak, febrile earlier this morning.  Objective: Vitals: Blood pressure (!) 150/133, pulse 98, temperature 99.7 F (37.6 C), temperature source Oral, resp. rate (!) 23, height '6\' 2"'$  (1.88 m), weight 117.9 kg, SpO2 100 %.   Exam: Gen Exam:Alert awake-not in any distress HEENT:atraumatic, normocephalic Chest: B/L clear to auscultation anteriorly CVS:S1S2 regular Abdomen:soft non tender, non distended Extremities: Right BKA-prosthesis in place.  Left lateral foot ulcer-without any major drainage.  Some mild surrounding  erythema Neurology: Non focal Skin: no rash  Pertinent Labs/Radiology:    Latest Ref Rng & Units 06/24/2022    1:03 AM 06/23/2022    4:46 PM 06/23/2022    2:04 PM  CBC  WBC 4.0 - 10.5 K/uL 13.4   13.5   Hemoglobin 13.0 - 17.0 g/dL 9.5  10.2  11.3   Hematocrit 39.0 - 52.0 % 28.7  30.0  33.0   Platelets 150 - 400 K/uL 134   141     Lab Results  Component Value Date   NA 139 06/24/2022   K 4.2 06/24/2022   CL 103 06/24/2022   CO2 21 (L) 06/24/2022      Assessment/Plan: Severe sepsis Etiology could be due to left diabetic foot ulcer with surrounding cellulitis-has no other foci of infection apparent.  However physical exam is not very impressive-he may have bacteremia-need to follow cultures.   Plan is to continue broad-spectrum IV antibiotics antibiotics-follow cultures Once patient a bit more awake/alert-we will need continued discussion to see if he is willing to undergo BKA.  Will discuss with [name].  Acute metabolic encephalopathy Slightly lethargic but otherwise comfortable. Etiology likely due to above Continue to treat underlying sepsis with antibiotics-follow clinical course.  Left diabetic foot Chronic left lateral foot ulcer with underlying chronic osteomyelitis See above-Per prior discharge summary-refused BKA when offered.  Has completed 6 weeks of IV antibiotics per prior discharge summary. Wound care eval appreciated We will discuss with [name]  PAD S/p left ray amputation, right BKA Has had multiple PCI's to left lower extremity in 2022. Continue statin  CAD-s/p PCI to RCA 2016 Denies any chest pain Continue statin  PAF Maintaining sinus rhythm Continue amiodarone Continue Eliquis  ESRD on HD TTS Missed HD on 10/14 Volume status/electrolytes stable Nephrology consulted.  Chronic HFpEF Volume status stable Volume removal with HD  BPH Continue Flomax  DM-2 (A1c 7.6 on 5/4) CBG stable Continue Semglee 15 units  daily/SSI Follow/optimize  Recent Labs    06/23/22 2355 06/24/22 0729 06/24/22 0750  GLUCAP 216* 166* 162*     Chronic pain syndrome Peripheral neuropathy RLS Continue Requip/Lyrica/fentanyl patch and as needed oxycodone. Watch for sedation.  Debility/deconditioning PT/OT eval  Obesity: Estimated body mass index is 33.38 kg/m as calculated from the following:   Height as of this encounter: '6\' 2"'$  (1.88 m).   Weight as of this encounter: 117.9 kg.   Code status:   Code Status: Full Code   DVT Prophylaxis: apixaban (ELIQUIS) tablet 5 mg    Family Communication:  Spouse-[name] Delores Cumby-[phone] updated on 10/15   Disposition Plan: Status is: Inpatient Remains inpatient appropriate because: Sepsis physiology-unclear source with suspicion that this could be from left diabetic foot-possible bacteremia-awaiting cultures.  On empiric IV antibiotics.  Not stable for discharge.   Planned Discharge Destination:Home   Diet: Diet Order             Diet renal/carb modified with fluid restriction Diet-HS Snack? Nothing; Fluid restriction: 1200 mL Fluid; Room service appropriate? Yes; Fluid consistency: Thin  Diet effective now                     Antimicrobial agents: Anti-infectives (From admission, onward)    Start     Dose/Rate Route Frequency Ordered Stop   06/26/22 1200  vancomycin (VANCOCIN) IVPB 1000 mg/200 mL premix        1,000 mg 200 mL/hr over 60 Minutes Intravenous Every T-Th-Sa (Hemodialysis) 06/23/22 2149     06/24/22 1600  cefTRIAXone (ROCEPHIN) 2 g in sodium chloride 0.9 % 100 mL IVPB        2 g 200 mL/hr over 30 Minutes Intravenous Every 24 hours 06/23/22 2006 07/01/22 1559   06/23/22 2200  metroNIDAZOLE (FLAGYL) tablet 500 mg        500 mg Oral Every 12 hours 06/23/22 2006 06/30/22 2159   06/23/22 2030  vancomycin (VANCOCIN) 2,250 mg in sodium chloride 0.9 % 500 mL IVPB        2,250 mg 261.3 mL/hr over 120 Minutes Intravenous  Once  06/23/22 2029 06/24/22 0052   06/23/22 1615  cefTRIAXone (ROCEPHIN) 1 g in sodium chloride 0.9 % 100 mL IVPB        1 g 200 mL/hr over 30 Minutes Intravenous  Once 06/23/22 1606 06/23/22 1700        MEDICATIONS: Scheduled Meds:  amiodarone  100 mg Oral Daily   apixaban  5 mg Oral BID   atorvastatin  40 mg Oral QHS   famotidine  20 mg Oral Daily   fentaNYL  1 patch Transdermal T65Y   folic acid  1 mg Oral Daily   [name]'s butt cream   Topical TID   insulin aspart  0-9 Units Subcutaneous TID WC   insulin glargine-yfgn  15 Units Subcutaneous QHS   loratadine  10 mg Oral Daily   metoCLOPramide  5 mg Oral TID AC   metroNIDAZOLE  500 mg Oral Q12H   midodrine  10 mg Oral QODAY   nitroGLYCERIN  0.2 mg Transdermal Daily   pantoprazole  40 mg Oral Daily   pregabalin  100 mg Oral BID   rOPINIRole  2 mg Oral QHS  tamsulosin  0.4 mg Oral Daily   Continuous Infusions:  cefTRIAXone (ROCEPHIN)  IV     [START ON 06/26/2022] vancomycin     PRN Meds:.acetaminophen **OR** acetaminophen, linaclotide, ondansetron **OR** ondansetron (ZOFRAN) IV, oxyCODONE-acetaminophen, polyvinyl alcohol   I have personally reviewed following labs and imaging studies  LABORATORY DATA: CBC: Recent Labs  Lab 06/23/22 1404 06/23/22 1646 06/24/22 0103  WBC 13.5*  --  13.4*  NEUTROABS 12.2*  --   --   HGB 11.3* 10.2* 9.5*  HCT 33.0* 30.0* 28.7*  MCV 93.0  --  94.7  PLT 141*  --  134*    Basic Metabolic Panel: Recent Labs  Lab 06/23/22 1404 06/23/22 1646 06/24/22 0103  NA 139 137 139  K 4.1 4.4 4.2  CL 100  --  103  CO2 25  --  21*  GLUCOSE 352*  --  196*  BUN 34*  --  35*  CREATININE 6.07*  --  6.16*  CALCIUM 8.1*  --  7.8*    GFR: Estimated Creatinine Clearance: 15.7 mL/min (A) (by C-G formula based on SCr of 6.16 mg/dL (H)).  Liver Function Tests: Recent Labs  Lab 06/23/22 1404 06/24/22 0103  AST 20 23  ALT 16 14  ALKPHOS 133* 94  BILITOT 0.9 0.9  PROT 6.1* 5.0*  ALBUMIN  3.1* 2.4*   No results for input(s): "LIPASE", "AMYLASE" in the last 168 hours. No results for input(s): "AMMONIA" in the last 168 hours.  Coagulation Profile: Recent Labs  Lab 06/23/22 1404 06/24/22 0103  INR 1.3* 1.5*    Cardiac Enzymes: No results for input(s): "CKTOTAL", "CKMB", "CKMBINDEX", "TROPONINI" in the last 168 hours.  BNP (last 3 results) No results for input(s): "PROBNP" in the last 8760 hours.  Lipid Profile: No results for input(s): "CHOL", "HDL", "LDLCALC", "TRIG", "CHOLHDL", "LDLDIRECT" in the last 72 hours.  Thyroid Function Tests: No results for input(s): "TSH", "T4TOTAL", "FREET4", "T3FREE", "THYROIDAB" in the last 72 hours.  Anemia Panel: No results for input(s): "VITAMINB12", "FOLATE", "FERRITIN", "TIBC", "IRON", "RETICCTPCT" in the last 72 hours.  Urine analysis:    Component Value Date/Time   COLORURINE YELLOW 06/23/2022 2015   APPEARANCEUR CLEAR 06/23/2022 2015   LABSPEC 1.013 06/23/2022 2015   PHURINE 8.0 06/23/2022 2015   GLUCOSEU >=500 (A) 06/23/2022 2015   HGBUR SMALL (A) 06/23/2022 2015   BILIRUBINUR NEGATIVE 06/23/2022 2015   KETONESUR NEGATIVE 06/23/2022 2015   PROTEINUR 100 (A) 06/23/2022 2015   UROBILINOGEN 0.2 01/14/2015 0346   NITRITE NEGATIVE 06/23/2022 2015   LEUKOCYTESUR NEGATIVE 06/23/2022 2015    Sepsis Labs: Lactic Acid, Venous    Component Value Date/Time   LATICACIDVEN 3.7 ([place]) 06/24/2022 0103    MICROBIOLOGY: Recent Results (from the past 240 hour(s))  Resp Panel by RT-PCR (Flu A&B, Covid) Anterior Nasal Swab     Status: None   Collection Time: 06/23/22  4:23 PM   Specimen: Anterior Nasal Swab  Result Value Ref Range Status   SARS Coronavirus 2 by RT PCR NEGATIVE NEGATIVE Final    Comment: (NOTE) SARS-CoV-2 target nucleic acids are NOT DETECTED.  The SARS-CoV-2 RNA is generally detectable in upper respiratory specimens during the acute phase of infection. The lowest concentration of SARS-CoV-2 viral copies  this assay can detect is 138 copies/mL. A negative result does not preclude SARS-Cov-2 infection and should not be used as the sole basis for treatment or other patient management decisions. A negative result may occur with  improper specimen collection/handling, submission of specimen  other than nasopharyngeal swab, presence of viral mutation(s) within the areas targeted by this assay, and inadequate number of viral copies(<138 copies/mL). A negative result must be combined with clinical observations, patient history, and epidemiological information. The expected result is Negative.  Fact Sheet for Patients:  EntrepreneurPulse.com.au  Fact Sheet for Healthcare Providers:  IncredibleEmployment.be  This test is no t yet approved or cleared by the [place] FDA and  has been authorized for detection and/or diagnosis of SARS-CoV-2 by FDA under an Emergency Use Authorization (EUA). This EUA will remain  in effect (meaning this test can be used) for the duration of the COVID-19 declaration under Section 564(b)(1) of the Act, 21 U.S.C.section 360bbb-3(b)(1), unless the authorization is terminated  or revoked sooner.       Influenza A by PCR NEGATIVE NEGATIVE Final   Influenza B by PCR NEGATIVE NEGATIVE Final    Comment: (NOTE) The Xpert Xpress SARS-CoV-2/FLU/RSV plus assay is intended as an aid in the diagnosis of influenza from Nasopharyngeal swab specimens and should not be used as a sole basis for treatment. Nasal washings and aspirates are unacceptable for Xpert Xpress SARS-CoV-2/FLU/RSV testing.  Fact Sheet for Patients: EntrepreneurPulse.com.au  Fact Sheet for Healthcare Providers: IncredibleEmployment.be  This test is not yet approved or cleared by the [place] FDA and has been authorized for detection and/or diagnosis of SARS-CoV-2 by FDA under an Emergency Use Authorization (EUA). This EUA  will remain in effect (meaning this test can be used) for the duration of the COVID-19 declaration under Section 564(b)(1) of the Act, 21 U.S.C. section 360bbb-3(b)(1), unless the authorization is terminated or revoked.  Performed at [facility], [place] [address], [place], [place] [address]     RADIOLOGY STUDIES/RESULTS: CT CHEST ABDOMEN PELVIS WO CONTRAST  Result Date: 06/23/2022 CLINICAL DATA:  Left foot osteomyelitis. Clinical concern for sepsis. Diabetes. EXAM: CT CHEST, ABDOMEN AND PELVIS WITHOUT CONTRAST TECHNIQUE: Multidetector CT imaging of the chest, abdomen and pelvis was performed following the standard protocol without IV contrast. RADIATION DOSE REDUCTION: This exam was performed according to the departmental dose-optimization program which includes automated exposure control, adjustment of the mA and/or kV according to patient size and/or use of iterative reconstruction technique. COMPARISON:  None Available. FINDINGS: CT CHEST FINDINGS Cardiovascular: Enlarged heart. Atheromatous calcifications, including the coronary arteries and aorta. Mediastinum/Nodes: No enlarged mediastinal, hilar, or axillary lymph nodes. Thyroid gland, trachea, and esophagus demonstrate no significant findings. Lungs/Pleura: Mild bibasilar dependent atelectasis. 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 Lower cervical spine fixation hardware. CT ABDOMEN PELVIS FINDINGS Hepatobiliary: No focal liver abnormality is seen. Status post cholecystectomy. No biliary dilatation. Pancreas: Mild pancreatic atrophy. Spleen: Normal in size without focal abnormality. Adrenals/Urinary Tract: Normal appearing adrenal glands. Stable small exophytic right renal cyst. Unremarkable left kidney, ureters and urinary bladder. Stomach/Bowel: Descending and sigmoid colon diverticula. Mid sigmoid colon anastomosis. Anastomosis involving a dilated distal small bowel loop containing fecalized material.  Small appendicoliths in the proximal appendix without evidence of appendicitis. Unremarkable stomach. Vascular/Lymphatic: Atheromatous arterial calcifications without aneurysm. Multiple mildly enlarged left inguinal lymph nodes with central low density. The largest has a short axis diameter of 13 mm on image number 129/3, previously 9 mm. Reproductive: Prostate is unremarkable. Other: Small bilateral inguinal hernias containing fat. 3 supraumbilical ventral hernias containing fat. Midline infraumbilical scar and right lower quadrant anterior scar. Musculoskeletal: Interbody and pedicle screw and rod fusion at the L4 through S1 levels. Lumbar spine degenerative changes. IMPRESSION: 1. Multiple mildly  enlarged left inguinal lymph nodes with central low density. This is nonspecific with differential considerations including adenitis, reactive adenopathy and metastatic adenopathy. 2. No acute abnormality in the abdomen or pelvis. 3. Cardiomegaly. 4.  Calcific coronary artery and aortic atherosclerosis. 5. Colonic diverticulosis. 6. Multiple ventral hernias containing fat. Aortic Atherosclerosis (ICD10-I70.0). Electronically Signed   By: Claudie Revering M.D.   On: 06/23/2022 17:51   CT Foot Left Wo Contrast  Result Date: 06/23/2022 CLINICAL DATA:  Foot ulcer, concern for osteomyelitis EXAM: CT OF THE LEFT FOOT WITHOUT CONTRAST TECHNIQUE: Multidetector CT imaging of the left foot was performed according to the standard protocol. Multiplanar CT image reconstructions were also generated. RADIATION DOSE REDUCTION: This exam was performed according to the departmental dose-optimization program which includes automated exposure control, adjustment of the mA and/or kV according to patient size and/or use of iterative reconstruction technique. COMPARISON:  X-ray 06/23/2022, MRI 01/12/2022 FINDINGS: Bones/Joint/Cartilage Bones are diffusely demineralized. 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 Cortical thickening  and sclerosis of the third and fourth metatarsal bases and to a lesser degree within the cuboid compatible with sequela of chronic osteomyelitis. No evidence of a new erosion or aggressive periosteal reaction. No fracture or dislocation. Similar degenerative changes of the foot. No tibiotalar or subtalar joint effusions. Ligaments Suboptimally assessed by CT. Muscles and Tendons Denervation and amputation changes of the musculotendinous structures of the foot. No acute findings. No appreciable tenosynovial fluid collection. Soft tissues Shallow soft tissue ulceration at the lateral aspect of the hindfoot in the region of the calcaneocuboid joint. Soft tissue edema at the lateral and dorsal aspects of the foot. No organized fluid collection. No soft tissue gas. IMPRESSION: 1. Shallow soft tissue ulceration at the lateral aspect of the hindfoot in the region of the calcaneocuboid joint with adjacent cellulitis. 2. Findings of chronic osteomyelitis involving the third and fourth metatarsal bases and adjacent cuboid. No evidence of a new erosion or aggressive periosteal reaction to suggest acute osteomyelitis by CT. Electronically Signed   By: Davina Poke D.O.   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 No recent fracture or dislocation is seen. Osteopenia is seen in bony structures. No focal lytic lesions are seen. Plantar spur is seen in calcaneus. Degenerative changes are noted in the intertarsal and tarsometatarsal joints. Arterial calcifications are seen in soft tissues. Overall, no significant interval changes are noted. IMPRESSION: No fracture or dislocation is seen. Postsurgical changes as described in the body of the report. There are no focal lytic lesions. If there is clinical suspicion for  osteomyelitis, follow-up MRI may be considered. Electronically Signed   By: Elmer Picker M.D.   On: 06/23/2022 15:47   DG Chest 2 View  Result Date: 06/23/2022 CLINICAL DATA:  Suspected sepsis EXAM: CHEST - 2 VIEW COMPARISON:  01/11/2022 FINDINGS: The heart size and mediastinal contours are within normal limits. Both lungs are clear. The visualized skeletal structures are unremarkable. IMPRESSION: No active cardiopulmonary disease. Electronically Signed   By: Nelson Chimes M.D.   On: 06/23/2022 15:44     LOS: 1 day   Oren Binet, MD  [facility]    To contact the attending provider between 7A-7P or the covering provider during after hours 7P-7A, please log into the web site [email or web address] and  access using universal [place] password for that web site. If you do not have the password, please call the hospital operator.  06/24/2022, 9:29 AM

## 2022-06-24 NOTE — ED Notes (Signed)
Patient transported to floor at this time in hospital bed.  All personal belongings transported with patient

## 2022-06-24 NOTE — Consult Note (Addendum)
Renal Service Consult Note [name]  [name] 06/24/2022 Sol Blazing, MD Requesting Physician: Dr. Sloan Leiter, Chauncey Cruel.   Reason for Consult: ESRD pt w/ fevers/ sepsis HPI: The patient is a 66 y.o. year-old w/ hx of DM2, HTN, PAD sp R BKA, COPD, esrd on HD who presented to ED w/ c/o fevers, chills, cough and SOB w/ URI symptoms x 1 week. Also abd pain and no appetite. Missed HD Sat. In ED temp 103, BP's stable, RR and HR high but improving now. Asked to see for esrd.    Pt seen in room. States they have to "pull a lot" of fluid on him when he goes to his OP dialysis. No access issues. Feeling a bit better now, temp is down.   Lives w/ wife and son, takes [employer] bus to his HD sessions.    ROS - denies CP, no joint pain, no HA, no blurry vision, no rash, no diarrhea, no nausea/ vomiting, no dysuria, no difficulty voiding   Past Medical History  Past Medical History:  Diagnosis Date   Carotid artery occlusion 11/10/10   LEFT CAROTID ENDARTERECTOMY   Chronic kidney disease    Complication of anesthesia    BP WENT UP AT [facility] "   COPD (chronic obstructive pulmonary disease) ([place])    pt denies this dx as of 06/01/20 - no inhaler    Diabetes mellitus without complication ([place])    Diverticulitis    Diverticulosis of colon (without mention of hemorrhage)    DJD (degenerative joint disease)    knees/hands/feet/back/neck   Fatty liver    Full dentures    GERD (gastroesophageal reflux disease)    H/O hiatal hernia    History of blood transfusion    with a past surical procedure per patient 06/01/20   Hyperlipidemia    Hypertension    Neuromuscular disorder ([place])    peripheral neuropathy   Non-pressure chronic ulcer of other part of left foot limited to breakdown of skin ([place]) 11/12/2016   Osteomyelitis (HCC)    left 5th metatarsal   PAD (peripheral artery disease) ([place])    Distal aortogram June 2012. Atherectomy left popliteal artery July 2012.    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   Unstable angina ([place]) 09/16/2018   Wears glasses    Past Surgical History  Past Surgical History:  Procedure Laterality Date   ABDOMINAL AORTOGRAM W/LOWER EXTREMITY N/A 06/23/2021   Procedure: ABDOMINAL AORTOGRAM W/LOWER EXTREMITY;  Surgeon: Nigel Mormon, MD;  Location: [place] [facility];  Service: Cardiovascular;  Laterality: N/A;   AMPUTATION  11/05/2011   Procedure: AMPUTATION RAY;  Surgeon: Wylene Simmer, MD;  Location: [place];  Service: Orthopedics;  Laterality: Right;  Amputation of Right 4&5th Toes   AMPUTATION Left 11/26/2012   Procedure: AMPUTATION RAY;  Surgeon: Wylene Simmer, MD;  Location: [place];  Service: Orthopedics;  Laterality: Left;  fourth ray amputation   AMPUTATION Right 08/27/2014   Procedure: Transmetatarsal Amputation;  Surgeon: Newt Minion, MD;  Location: [place];  Service: Orthopedics;  Laterality: Right;   AMPUTATION Right 01/14/2015   Procedure: AMPUTATION BELOW KNEE;  Surgeon: Newt Minion, MD;  Location: [place];  Service: Orthopedics;  Laterality: Right;   AMPUTATION Left 10/21/2015   Procedure: Left Foot 5th Ray Amputation;  Surgeon:  Newt Minion, MD;  Location: [place];  Service: Orthopedics;  Laterality: Left;   ANTERIOR FUSION CERVICAL SPINE  02/06/06   C4-5, C5-6, C6-7; SURGEON DR. MAX COHEN   AV FISTULA PLACEMENT Left 06/02/2020   Procedure: ARTERIOVENOUS (AV) FISTULA CREATION LEFT;  Surgeon: Waynetta Sandy, MD;  Location: [place];  Service: Vascular;  Laterality: Left;   BACK SURGERY     x 3   [place] Left 07/21/2020   Procedure: LEFT UPPER ARM ATERIOVENOUS SUPERFISTULALIZATION;  Surgeon: Waynetta Sandy, MD;  Location: [place];  Service: Vascular;  Laterality: Left;   BELOW KNEE LEG AMPUTATION Right    CARDIAC CATHETERIZATION  10/31/04   2009   CAROTID ENDARTERECTOMY  11/10/10    CAROTID ENDARTERECTOMY Left 11/10/2010   Subtotal occlusion of left internal carotid artery with left hemispheric transient ischemic attacks.   CAROTID STENT     CARPAL TUNNEL RELEASE Right 10/21/2013   Procedure: RIGHT CARPAL TUNNEL RELEASE;  Surgeon: Wynonia Sours, MD;  Location: [place];  Service: Orthopedics;  Laterality: Right;   CHOLECYSTECTOMY     COLON SURGERY     COLONOSCOPY     COLOSTOMY REVERSAL  05/21/2018   ileostomy reversal   CYSTOSCOPY WITH STENT PLACEMENT Bilateral 01/13/2018   Procedure: CYSTOSCOPY WITH BILATERAL URETERAL CATHETER PLACEMENT;  Surgeon: Ardis Hughs, MD;  Location: [facility];  Service: Urology;  Laterality: Bilateral;   ESOPHAGEAL MANOMETRY Bilateral 07/19/2014   Procedure: ESOPHAGEAL MANOMETRY (EM);  Surgeon: Jerene Bears, MD;  Location: [facility];  Service: Gastroenterology;  Laterality: Bilateral;   EYE SURGERY Bilateral 2020   cataract   FEMORAL ARTERY STENT     x6   FINGER SURGERY     FOOT SURGERY  04/25/2016    EXCISION BASE 5TH METATARSAL AND PARTIAL CUBOID LEFT FOOT   HERNIA REPAIR     LEFT INGUINAL AND UMBILICAL REPAIRS   HERNIA REPAIR     I & D EXTREMITY Left 04/25/2016   Procedure: EXCISION BASE 5TH METATARSAL AND PARTIAL CUBOID LEFT FOOT;  Surgeon: Newt Minion, MD;  Location: [place];  Service: Orthopedics;  Laterality: Left;   ILEOSTOMY  01/13/2018   Procedure: ILEOSTOMY;  Surgeon: Clovis Riley, MD;  Location: [facility];  Service: General;;   ILEOSTOMY CLOSURE N/A 05/21/2018   Procedure: ILEOSTOMY REVERSAL ERAS PATHWAY;  Surgeon: Clovis Riley, MD;  Location: [place];  Service: General;  Laterality: N/A;   IR RADIOLOGIST EVAL & MGMT  11/19/2017   IR RADIOLOGIST EVAL & MGMT  12/03/2017   IR RADIOLOGIST EVAL & MGMT  12/18/2017   JOINT REPLACEMENT Right 2001   Total knee   LAMINECTOMY     X 3 LUMBAR AND X 2 CERVICAL SPINE OPERATIONS   LAPAROSCOPIC CHOLECYSTECTOMY W/ CHOLANGIOGRAPHY  11/09/04   SURGEON DR. Sulphur CATH AND CORONARY ANGIOGRAPHY N/A 09/16/2018   Procedure: LEFT HEART CATH AND CORONARY ANGIOGRAPHY;  Surgeon: Nigel Mormon, MD;  Location: [place] [facility];  Service: Cardiovascular;  Laterality: N/A;   LEFT HEART CATHETERIZATION WITH CORONARY ANGIOGRAM N/A 10/29/2014   Procedure: LEFT HEART CATHETERIZATION WITH CORONARY ANGIOGRAM;  Surgeon: Laverda Page, MD;  Location: [facility];  Service: Cardiovascular;  Laterality: N/A;   LIGATION OF COMPETING BRANCHES OF ARTERIOVENOUS FISTULA Left 07/21/2020   Procedure: LIGATION OF COMPETING BRANCHES OF LEFT UPPER ARM ARTERIOVENOUS FISTULA;  Surgeon: Waynetta Sandy, MD;  Location: [place];  Service:  Vascular;  Laterality: Left;   LOWER EXTREMITY ANGIOGRAM N/A 03/19/2012   Procedure: LOWER EXTREMITY ANGIOGRAM;  Surgeon: Burnell Blanks, MD;  Location: [facility] C[facility] LAB;  Service: Cardiovascular;  Laterality: N/A;   LOWER EXTREMITY ANGIOGRAPHY N/A 06/20/2021   Procedure: LOWER EXTREMITY ANGIOGRAPHY;  Surgeon: Nigel Mormon, MD;  Location: [place] [facility];  Service: Cardiovascular;  Laterality: N/A;   NECK SURGERY     PARTIAL COLECTOMY N/A 01/13/2018   Procedure: LAPAROSCOPIC ASSISTED   SIGMOID COLECTOMY ILEOSTOMY;  Surgeon: Clovis Riley, MD;  Location: [facility];  Service: General;  Laterality: N/A;   PENILE PROSTHESIS IMPLANT  08/14/05   INFRAPUBIC INSERTION OF INFLATABLE PENILE PROSTHESIS; SURGEON DR. Amalia Hailey   PENILE PROSTHESIS IMPLANT     PERCUTANEOUS CORONARY STENT INTERVENTION (PCI-S) Right 10/29/2014   Procedure: PERCUTANEOUS CORONARY STENT INTERVENTION (PCI-S);  Surgeon: Laverda Page, MD;  Location: [facility] LAB;  Service: Cardiovascular;  Laterality: Right;   PERIPHERAL VASCULAR INTERVENTION Left 06/23/2021   Procedure: PERIPHERAL VASCULAR INTERVENTION;  Surgeon: Nigel Mormon, MD;  Location: [place] [facility];  Service: Cardiovascular;  Laterality: Left;   REMOVAL OF PENILE PROSTHESIS N/A  06/14/2021   Procedure: Removal of THREE piece inflatable penile prosthesis;  Surgeon: Lucas Mallow, MD;  Location: [place];  Service: Urology;  Laterality: N/A;   SHOULDER ARTHROSCOPY     SPINE SURGERY     TOE AMPUTATION Left    TONSILLECTOMY     TOTAL KNEE ARTHROPLASTY  07/2002   RIGHT KNEE ; SURGEON  DR. [name] ALSO HAD ARTHROSCOPIC RIGHT KNEE IN  10/2001   TOTAL KNEE ARTHROPLASTY     ULNAR NERVE TRANSPOSITION Right 10/21/2013   Procedure: RIGHT ELBOW  ULNAR NERVE DECOMPRESSION;  Surgeon: Wynonia Sours, MD;  Location: [place];  Service: Orthopedics;  Laterality: Right;   Family History  Family History  Problem Relation Age of Onset   Heart disease Father        Before age 25-  CAD, BPG   Diabetes Father        Amputation   Cancer Father        PROSTATE   Hyperlipidemia Father    Hypertension Father    Heart attack Father        Triple BPG   Varicose Veins Father    Cancer Sister        Breast   Hyperlipidemia Sister    Hypertension Sister    Heart attack Brother    Colon cancer Brother    Diabetes Brother    Heart disease Brother 58       A-Fib. Before age 77   Hyperlipidemia Brother    Hypertension Brother    Hypertension Son    Arthritis Other        GRANDMOTHER   Hypertension Other        OTHER FAMILY MEMBERS   Social History  reports that he quit smoking about 10 years ago. His smoking use included cigarettes. He has a 70.00 pack-year smoking history. He has never used smokeless tobacco. He reports that he does not currently use alcohol. He reports that he does not use drugs. Allergies  Allergies  Allergen Reactions   Contrast Media [Iodinated Contrast Media] Shortness Of Breath and Other (See Comments)    Difficulty breathing and altered mental status     Ivp Dye [Iodinated Contrast Media] Anaphylaxis, Shortness Of Breath and Other (See Comments)    Breathing problems, altered mental  state    Adhesive [Tape] Rash and Other (See  Comments)    Rash after 1 day of use   Latex Rash and Other (See Comments)    A severe rash appears after the first 24 hours of being placed   Home medications Prior to Admission medications   Medication Sig Start Date End Date Taking? Authorizing Provider  amiodarone (PACERONE) 200 MG tablet Take 0.5 tablets (100 mg total) by mouth daily. 04/27/22  Yes Adrian Prows, MD  amLODipine (NORVASC) 5 MG tablet Take 5 mg by mouth daily as needed for dizziness. Take one tablet for BP > 160 mm Hg 01/30/21  Yes [provider]  apixaban (ELIQUIS) 5 MG TABS tablet Take 1 tablet (5 mg total) by mouth 2 (two) times daily. 01/14/22 06/23/22 Yes Mercy Riding, MD  atorvastatin (LIPITOR) 40 MG tablet Take 40 mg by mouth at bedtime.    Yes [provider]  B Complex-C-Zn-Folic Acid (DIALYVITE/ZINC) TABS Take 1 tablet by mouth daily. 10/04/21  Yes [provider]  doxycycline (VIBRA-TABS) 100 MG tablet TAKE 1 TABLET BY MOUTH TWICE A DAY Patient taking differently: Take 100 mg by mouth 2 (two) times daily. 04/02/22  Yes Newt Minion, MD  famotidine (PEPCID) 20 MG tablet Take 20 mg by mouth daily.   Yes [provider]  fentaNYL (DURAGESIC) 50 MCG/HR Place 1 patch onto the skin every 3 (three) days. 09/05/21  Yes Sharen Hones, MD  folic acid (FOLVITE) 1 MG tablet Take 1 mg by mouth daily. 09/18/21  Yes [provider]  Glucosamine HCl (GLUCOSAMINE PO) Take 1 tablet by mouth 2 (two) times daily.   Yes [provider]  insulin glargine (LANTUS) 100 UNIT/ML injection Inject 0.1 mLs (10 Units total) into the skin at bedtime. Patient taking differently: Inject 30 Units into the skin at bedtime. 06/30/21  Yes Antonieta Pert, MD  ketoconazole (NIZORAL) 2 % cream Apply 1 application topically 2 (two) times daily as needed for irritation.  03/07/18  Yes [provider]  lidocaine-prilocaine (EMLA) cream Apply 1 application topically See admin instructions. Prior to Dialysis  days Tuesday,Thursday and saturday 06/06/21  Yes [provider]  linaclotide (LINZESS) 145 MCG CAPS capsule Take 145 mcg by mouth daily as needed (constipation).    Yes [provider]  loratadine (CLARITIN) 10 MG tablet Take 10 mg by mouth daily.   Yes [provider]  midodrine (PROAMATINE) 10 MG tablet Take 1 tablet (10 mg total) by mouth every other day. One hour before dialysis 09/07/21  Yes Barb Merino, MD  nitroGLYCERIN (NITRODUR - DOSED IN MG/24 HR) 0.2 mg/hr patch Place 1 patch (0.2 mg total) onto the skin daily. 05/21/22  Yes Newt Minion, MD  nitroGLYCERIN (NITROSTAT) 0.4 MG SL tablet Place 0.4 mg under the tongue every 5 (five) minutes as needed for chest pain. 01/06/22  Yes [provider]  NOVOLOG FLEXPEN 100 UNIT/ML FlexPen Inject 13 Units into the skin 3 (three) times daily. 06/05/22  Yes [provider]  omega-3 acid ethyl esters (LOVAZA) 1 g capsule Take 2 g by mouth 2 (two) times daily.   Yes [provider]  ondansetron (ZOFRAN-ODT) 4 MG disintegrating tablet Take 1 tablet by mouth 4 (four) times daily as needed for nausea/vomiting. 05/28/22  Yes [provider]  oxyCODONE-acetaminophen (PERCOCET) 7.5-325 MG tablet Take 1 tablet by mouth 3 (three) times daily as needed for moderate pain. Patient taking differently: Take 1 tablet by mouth every  8 (eight) hours. 09/05/21  Yes Sharen Hones, MD  pantoprazole (PROTONIX) 40 MG tablet Take 1 tablet (40 mg total) by mouth 2 (two) times daily for 30 days, THEN 1 tablet (40 mg total) daily. 01/14/22 06/23/22 Yes Mercy Riding, MD  Polyvinyl Alcohol-Povidone PF 1.4-0.6 % SOLN Place 1 drop into both eyes 2 (two) times daily as needed (dry eyes).    Yes [provider]  pregabalin (LYRICA) 100 MG capsule Take 100 mg by mouth 2 (two) times daily. 09/18/21  Yes [provider]  rOPINIRole (REQUIP) 2 MG tablet Take 2 mg by mouth at bedtime.   Yes [provider]   silver sulfADIAZINE (SILVADENE) 1 % cream Apply topically daily. 01/14/22  Yes Mercy Riding, MD  tamsulosin (FLOMAX) 0.4 MG CAPS capsule Take 0.4 mg by mouth daily.   Yes [provider]  HUMALOG KWIKPEN 100 UNIT/ML KwikPen Inject 4 Units into the skin 3 (three) times daily. Patient not taking: Reported on 06/23/2022 09/05/21   Sharen Hones, MD  metoCLOPramide (REGLAN) 5 MG tablet Take 1 tablet (5 mg total) by mouth 4 (four) times daily -  before meals and at bedtime. Patient taking differently: Take 5 mg by mouth 3 (three) times daily before meals. 01/14/22 07/13/22  Mercy Riding, MD  NEEDLE, REUSABLE, 22 G 22G X 1-1/2" MISC 1 Units by Does not apply route as directed. For B12 IM inj 02/15/18   Elgergawy, Silver Huguenin, MD  sevelamer carbonate (RENVELA) 800 MG tablet Take 800 mg by mouth 3 (three) times daily. Patient not taking: Reported on 06/23/2022 12/17/21   [provider]  Syringe/Needle, Disp, (SYRINGE 3CC/22GX1-1/2") 22G X 1-1/2" 3 ML MISC 1 Syringe by Does not apply route as directed. For b12 IM inj 02/15/18   Elgergawy, Silver Huguenin, MD     Vitals:   06/24/22 1052 06/24/22 1100 06/24/22 1200 06/24/22 1300  BP:  (!) 112/48 (!) 121/55 (!) 120/52  Pulse:  75 68 66  Resp:  '15 14 14  '$ Temp: 98.9 F (37.2 C)     TempSrc: Oral     SpO2:  100% 100% 96%  Weight:      Height:       Exam Gen alert, no distress, chron ill appearing No rash, cyanosis or gangrene Sclera anicteric, throat clear  No jvd or bruits Chest clear bilat to bases, no rales/ wheezing RRR no MRG Abd soft ntnd no mass or ascites +bs GU normal male MS no joint effusions or deformity Ext diffuse 1+ nonpitting edema of LLE and UE's Neuro is alert, Ox 3 , nf    LUA AVF+bruit   Home meds include - amiodarone, amlodipine, apixaban, atorvastatin, famotidine, duragesic patch, insulin glargine/ novolog, midodrine 10 mg pre hd tts, nitroglycerin patch 0.'2mg'$  qd, oxycodone-aceta prn, pantoprazole, pregabalin,  ropinirole, tamsulosin, reglan, sevelamer carbonate 1 ac tid, prns/ vits/ supps   OP HD: [facility] 4h 99mn  450/800   112kg   2/2 bath  Hep none  LUA AVF - last HD 10/12, post wt 120kg - comes off 5-8kg over usually - mircera 30 mg q 4 wks, last 10/7 - doxercalciferol 2 ug tiw IV tts     BP 120- 160 / 60- 75,  HR 68- 98, RR 14- 23  tmax 103    Na 139  K 4 .2  BUN 35  Cr 6.1       CXR 10/14 - IMPRESSION: No active cardiopulmonary disease.    Assessment/  Plan: Sepsis - w/ temp 103, high HR/ RR, L leg erythema c/w cellulitis. Started on empiric IV abx and blood cx's sent off.  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 Sp 6 wks IV abx per prior dc summary.  ESRD - on HD TTS. Missed HD yesterday. No acute indication for RRT today. Will plan HD off schedule tomorrow then resume TTS on 10/17.  PAD - sp R BKA, L ray amp CAD h/o PCI PAF - in NSR here, per pmd  HTN/ vol  - takes norvasc at home, and pre HD midodrine. Does not get close to his dry wt at OP HD, coming off 5-8 kg over. Here looks a bit edematous but no resp issues, CXR clear. Plan UF as bp tolerates w/ hd Anemia esrd - esa not due for sometime, follow, transfuse if needed.  MBD ckd - CCa in range, add on phos.       Rob Nazly Digilio  MD 06/24/2022, 1:34 PM Recent Labs  Lab 06/23/22 1404 06/23/22 1646 06/24/22 0103  HGB 11.3* 10.2* 9.5*  ALBUMIN 3.1*  --  2.4*  CALCIUM 8.1*  --  7.8*  CREATININE 6.07*  --  6.16*  K 4.1 4.4 4.2   Inpatient medications:  amiodarone  100 mg Oral Daily   apixaban  5 mg Oral BID   atorvastatin  40 mg Oral QHS   famotidine  20 mg Oral Daily   fentaNYL  1 patch Transdermal Y65K   folic acid  1 mg Oral Daily   [name]'s butt cream   Topical TID   insulin aspart  0-9 Units Subcutaneous TID WC   insulin glargine-yfgn  15 Units Subcutaneous QHS   loratadine  10 mg Oral Daily   metoCLOPramide  5 mg Oral TID AC   metroNIDAZOLE  500 mg Oral  Q12H   midodrine  10 mg Oral QODAY   nitroGLYCERIN  0.2 mg Transdermal Daily   pantoprazole  40 mg Oral Daily   pregabalin  100 mg Oral BID   rOPINIRole  2 mg Oral QHS   tamsulosin  0.4 mg Oral Daily    cefTRIAXone (ROCEPHIN)  IV     [START ON 06/26/2022] vancomycin     acetaminophen **OR** acetaminophen, linaclotide, ondansetron **OR** ondansetron (ZOFRAN) IV, oxyCODONE-acetaminophen, polyvinyl alcohol

## 2022-06-24 NOTE — Progress Notes (Signed)
PHARMACY - PHYSICIAN COMMUNICATION CRITICAL VALUE ALERT - BLOOD CULTURE IDENTIFICATION (BCID)  [name] is an 66 y.o. male who presented to [facility] on 06/23/2022 with fever, weakness, and possible sepsis.   Assessment:  Patient with known chronic left foot ischemic ulcer with underlying osteomyelitis. Patient growing group B strep in blood.   Name of physician (or Provider) ContactedBridgett Larsson MD  Current antibiotics: vancomycin, ceftriaxone, and flagyl  Changes to prescribed antibiotics recommended:  Recommendations accepted by provider, will narrow to pcg G 1g q6 hours due to ESRD.   Results for orders placed or performed during the hospital encounter of 06/23/22  Blood Culture ID Panel (Reflexed) (Collected: 06/23/2022  4:23 PM)  Result Value Ref Range   Enterococcus faecalis NOT DETECTED NOT DETECTED   Enterococcus Faecium NOT DETECTED NOT DETECTED   Listeria monocytogenes NOT DETECTED NOT DETECTED   Staphylococcus species NOT DETECTED NOT DETECTED   Staphylococcus aureus (BCID) NOT DETECTED NOT DETECTED   Staphylococcus epidermidis NOT DETECTED NOT DETECTED   Staphylococcus lugdunensis NOT DETECTED NOT DETECTED   Streptococcus species DETECTED (A) NOT DETECTED   Streptococcus agalactiae DETECTED (A) NOT DETECTED   Streptococcus pneumoniae NOT DETECTED NOT DETECTED   Streptococcus pyogenes NOT DETECTED NOT DETECTED   A.calcoaceticus-baumannii NOT DETECTED NOT DETECTED   Bacteroides fragilis NOT DETECTED NOT DETECTED   Enterobacterales NOT DETECTED NOT DETECTED   Enterobacter cloacae complex NOT DETECTED NOT DETECTED   Escherichia coli NOT DETECTED NOT DETECTED   Klebsiella aerogenes NOT DETECTED NOT DETECTED   Klebsiella oxytoca NOT DETECTED NOT DETECTED   Klebsiella pneumoniae NOT DETECTED NOT DETECTED   Proteus species NOT DETECTED NOT DETECTED   Salmonella species NOT DETECTED NOT DETECTED   Serratia marcescens NOT DETECTED NOT DETECTED   Haemophilus influenzae NOT  DETECTED NOT DETECTED   Neisseria meningitidis NOT DETECTED NOT DETECTED   Pseudomonas aeruginosa NOT DETECTED NOT DETECTED   Stenotrophomonas maltophilia NOT DETECTED NOT DETECTED   Candida albicans NOT DETECTED NOT DETECTED   Candida auris NOT DETECTED NOT DETECTED   Candida glabrata NOT DETECTED NOT DETECTED   Candida krusei NOT DETECTED NOT DETECTED   Candida parapsilosis NOT DETECTED NOT DETECTED   Candida tropicalis NOT DETECTED NOT DETECTED   Cryptococcus neoformans/gattii NOT DETECTED NOT DETECTED    Erin Hearing PharmD., BCPS Clinical Pharmacist 06/24/2022 8:00 PM

## 2022-06-25 ENCOUNTER — Inpatient Hospital Stay (HOSPITAL_COMMUNITY): Payer: Medicare HMO

## 2022-06-25 DIAGNOSIS — I482 Chronic atrial fibrillation, unspecified: Secondary | ICD-10-CM | POA: Diagnosis not present

## 2022-06-25 DIAGNOSIS — M8660 Other chronic osteomyelitis, unspecified site: Secondary | ICD-10-CM

## 2022-06-25 DIAGNOSIS — N186 End stage renal disease: Secondary | ICD-10-CM | POA: Diagnosis not present

## 2022-06-25 DIAGNOSIS — A419 Sepsis, unspecified organism: Secondary | ICD-10-CM | POA: Diagnosis not present

## 2022-06-25 DIAGNOSIS — I5032 Chronic diastolic (congestive) heart failure: Secondary | ICD-10-CM | POA: Diagnosis not present

## 2022-06-25 DIAGNOSIS — E872 Acidosis, unspecified: Secondary | ICD-10-CM | POA: Diagnosis not present

## 2022-06-25 DIAGNOSIS — R6520 Severe sepsis without septic shock: Secondary | ICD-10-CM | POA: Diagnosis not present

## 2022-06-25 DIAGNOSIS — L03116 Cellulitis of left lower limb: Secondary | ICD-10-CM | POA: Diagnosis not present

## 2022-06-25 LAB — RENAL FUNCTION PANEL
Albumin: 2.3 g/dL — ABNORMAL LOW (ref 3.5–5.0)
Anion gap: 12 (ref 5–15)
BUN: 49 mg/dL — ABNORMAL HIGH (ref 8–23)
CO2: 22 mmol/L (ref 22–32)
Calcium: 7.7 mg/dL — ABNORMAL LOW (ref 8.9–10.3)
Chloride: 101 mmol/L (ref 98–111)
Creatinine, Ser: 7.28 mg/dL — ABNORMAL HIGH (ref 0.61–1.24)
GFR, Estimated: 8 mL/min — ABNORMAL LOW (ref >60)
Glucose, Bld: 223 mg/dL — ABNORMAL HIGH (ref 70–99)
Phosphorus: 5.3 mg/dL — ABNORMAL HIGH (ref 2.5–4.6)
Potassium: 3.6 mmol/L (ref 3.5–5.1)
Sodium: 135 mmol/L (ref 135–145)

## 2022-06-25 LAB — CBC
HCT: 26.3 % — ABNORMAL LOW (ref 39.0–52.0)
Hemoglobin: 9 g/dL — ABNORMAL LOW (ref 13.0–17.0)
MCH: 31.5 pg (ref 26.0–34.0)
MCHC: 34.2 g/dL (ref 30.0–36.0)
MCV: 92 fL (ref 80.0–100.0)
Platelets: 133 10*3/uL — ABNORMAL LOW (ref 150–400)
RBC: 2.86 MIL/uL — ABNORMAL LOW (ref 4.22–5.81)
RDW: 13.6 % (ref 11.5–15.5)
WBC: 9.3 10*3/uL (ref 4.0–10.5)
nRBC: 0 % (ref 0.0–0.2)

## 2022-06-25 LAB — GLUCOSE, CAPILLARY
Glucose-Capillary: 150 mg/dL — ABNORMAL HIGH (ref 70–99)
Glucose-Capillary: 126 mg/dL — ABNORMAL HIGH (ref 70–99)

## 2022-06-25 MED ORDER — JUVEN PO PACK
1.0000 | PACK | Freq: Two times a day (BID) | ORAL | Status: DC
  Administered 2022-06-26 – 2022-06-29 (×3): 1 via ORAL
  Filled 2022-06-25 (×4): qty 1

## 2022-06-25 MED ORDER — CEFAZOLIN SODIUM-DEXTROSE 2-4 GM/100ML-% IV SOLN
2.0000 g | Freq: Once | INTRAVENOUS | Status: AC
  Administered 2022-06-25: 2 g via INTRAVENOUS
  Filled 2022-06-25 (×2): qty 100

## 2022-06-25 MED ORDER — CHLORHEXIDINE GLUCONATE CLOTH 2 % EX PADS
6.0000 | MEDICATED_PAD | Freq: Every day | CUTANEOUS | Status: DC
  Administered 2022-06-26: 6 via TOPICAL

## 2022-06-25 MED ORDER — PERFLUTREN LIPID MICROSPHERE
1.0000 mL | INTRAVENOUS | Status: AC | PRN
  Administered 2022-06-25: 4 mL via INTRAVENOUS

## 2022-06-25 MED ORDER — CEFAZOLIN SODIUM-DEXTROSE 2-4 GM/100ML-% IV SOLN
2.0000 g | INTRAVENOUS | Status: DC
  Administered 2022-06-26 – 2022-06-28 (×2): 2 g via INTRAVENOUS
  Filled 2022-06-25 (×4): qty 100

## 2022-06-25 NOTE — Discharge Instructions (Signed)
Choosing healthy food, staying physically active and taking medicines as prescribed by your health care provider may help slow down the progression of kidney disease. There is not one eating plan that is right for everyone with kidney disease. Your registered dietitian nutritionist (RDN) will help you identify what's best for you to eat. Why is nutrition important in kidney disease? Your kidneys help keep nutrients and minerals balanced in your body and remove the waste products from your blood. With kidney disease, your kidneys may not be able to do this job very well. You may need to make some changes to your diet. You may need to control the amount of protein, sodium, potassium, phosphorus or calcium in your diet. You will also still need to follow diet recommendations for any other conditions you have, like heart disease or diabetes. Fortunately, these diets are similar. Your nutrition care plan might change over time depending on the status of your condition. Your registered dietitian nutritionist  (RDN) or health care provider will tell you if changes are needed based on your blood test results.   Tips How to plan a kidney-friendly meal Fill a 9-inch or 10-inch plate with: Fruits and/or vegetables Breads, cereals, or grains Protein   A healthy fat Your body needs protein to help build muscle, repair tissue, and fight infection. If you have kidney disease, eating less protein can help protect your kidneys if you are not on dialysis. The most effective way to protect your health is to eat less red meat such as beef or pork and smaller protein portions and to choose plant-proteins as a meat alternative. If you are on dialysis, the protein serving is the size of your palm. If you are not on dialysis, the protein serving is 1/3 to  the size of your palm. Your RDN will discuss how much protein you should eat. Eat at least 6 servings of grain daily and choose whole grains for at least half those  servings. Fruits and vegetables are an important part of a healthy diet and help increase your intake of fiber. Eat at least 5 servings of fresh, frozen, or canned fruits and vegetables daily. These foods are a source of potassium but you only need to limit how much you eat if your potassium level is high. Products labeled as "low sodium" may use potassium chloride in place of sodium. Check the ingredient list to make sure you can safely eat low-sodium foods. You can enjoy ____servings of dairy and dairy alternatives. Your RDN will make a specific recommendation based on your individual needs. Your health care provider will let you know if you need to limit fluid intake. Less fluid will help you manage urine output, and avoid fluid retention which can cause shortness of breath, swelling, high blood pressure, and increased strain on your heart and blood vessels.    Nutrients to Monitor You may need to pay attention to sodium, phosphorus, and potassium in your diet. Your RDN can provide you handouts on potassium and/or phosphorus for more details and strategies to manage these nutrients. Tips to limit sodium: Eat home-cooked meals made from fresh ingredients. Choose foods and condiments with 200 milligrams of sodium or less per serving. Use frozen or packaged meals with 600 milligrams or less sodium per serving if you are too tired to cook. Check labels to avoid foods that have more than 200 milligrams of sodium per serving. These foods may include canned soups or soup mixes, packaged foods, pickled foods, sauces, and seasonings.  Limit how much salt you add to foods or avoid it altogether. Salt-free seasonings like herbs, spices, lemon juice, and vinegar will flavor to your food without adding salt. Ask your RDN which frozen and convenience foods, fast foods, or restaurant meals may be ok for you. If you also have diabetes and/or heart disease: It is easy to manage these multiple diets because they are  similar in many ways. Eat a variety of healthy foods. Choose whole grain foods. Eat a moderate amount of protein and choose low-fat, lean, and heart-healthy options. Eat at least 5 servings/day of fruits and vegetables. Your blood potassium level will affect which fruits and vegetables you can safely eat. Eat less food with added salt, sugars, and fats.  Your RDN can provide you with additional recommendations if necessary.    Foods to Choose or Limit Your RDN will tell you if you need to limit phosphorus or potassium in your diet and provide you separate handouts about foods to choose or limit. Food Group Choose Limit  Grains Whole grain cereal Oats, oatmeal Whole wheat bread, pita English muffin Corn tortillas Whole wheat pasta Brown rice Quinoa Couscous Grits Popcorn Rice cakes Whole wheat crackers   Grains with more than 200 milligrams of sodium per serving Boxed biscuit, cake, pancake/waffle mixes and other convenience foods Snacks and sweets should be eaten in moderation    Protein Foods Eggs or egg whites Lean beef, wild game, and "all natural" chicken, fish, pork, seafood, or [place] Legumes/Pulses: Beans (such as black, kidney, or white beans), lentils, split peas, black-eyed peas Soy: Tofu, edamame Nuts and nut butters Protein with more than 200 mg sodium per serving Processed or frozen protein foods   Salty processed meats (such as bacon, bologna, salami and other lunch meats),  ham, hot dogs,  sausage, breakfast sausage, and pre-seasoned meats    Dairy and Dairy Alternatives Lower-phosphorus milk alternatives include unfortified almond, rice, soy or other plant beverages Lower-phosphorus cheeses include brie, goat, cream cheese, mozzarella, parmesan, or ricotta cheese Processed cheeses, such as American cheese, cheese spreads, boxed macaroni and cheese Milk-based or cheese-based soups or sauces Nondairy creamers  Vegetables Fresh, frozen, or no-salt added canned  vegetables Processed vegetables or vegetable juice with more than 200 milligrams sodium per serving. Pickled foods, such as olives, sauerkraut, pickles, kimchi Vegetables with added sauces  Fruit Fresh, frozen, or canned fruit Canned fruit in syrup or with added sugars  Fats and Oils Healthy fats such as olive oil, vegetable oil or lower sodium salad dressings Butter, margarine, mayonnaise, and sour cream in moderation Dressing, condiments and other sauces with more than 200 mg of sodium per serving  Beverages/ Fluids   (Fluids include anything that is liquid at room temperature. You may need to limit how much you drink if you are producing less urine.)   Water Coffee Tea Lemonade Seltzer Processed beverages (such as most colas, sports drinks, energy drinks, some flavored waters, drink mixes., some bottled teas and others) Canned soups with more than 200 mg sodium per serving Beer and wine    Other Herbs, spices, lemon juice, vinegars to flavor food instead of salt Stocks or broths labeled as "no salt added" Condiments and sauces with less than 200 mg sodium per serving Salt, and salt substitutes Bouillon and broths with more than 200 milligrams per serving Broths and soups labeled as "low sodium" Condiments and sauces with more than 200 mg sodium per serving

## 2022-06-25 NOTE — Progress Notes (Signed)
Initial Nutrition Assessment  DOCUMENTATION CODES:   Obesity unspecified  INTERVENTION:  - Add Juven BID.   - Provided low sodium diet education.   NUTRITION DIAGNOSIS:   Increased nutrient needs related to wound healing as evidenced by estimated needs.  GOAL:   Patient will meet greater than or equal to 90% of their needs  MONITOR:   PO intake  REASON FOR ASSESSMENT:   Consult Wound healing  ASSESSMENT:   66 y.o. male admits related to generalized weakness. 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 Pt is currently receiving medical management for severe sepsis.  Meds reviewed: pepcid, folic acid, sliding scale insulin, reglan. Labs reviewed: Phos high.   The pt reports that he has a good appetite. He states that he has been eating well since admission and PTA. Pt states that he has gained weight recently. He states that he needs to lose some weight. He states that his wife helps him with his diet at home and trys to eat a low sodium diet. Pt states that he will often eat eggs, bacon or sausage, sandwiches, meatloaf, potato chips. RD reviewed foods high in sodium and the importance of adding more fruits and vegetable into the diet. Pt verbalized understanding. RD will also attach diet handouts to discharge paperwork.   Pt with stage 2 wound; RD will add Juven BID.   NUTRITION - FOCUSED PHYSICAL EXAM:  Flowsheet Row Most Recent Value  Orbital Region No depletion  Upper Arm Region No depletion  Thoracic and Lumbar Region No depletion  Buccal Region No depletion  Temple Region No depletion  Clavicle Bone Region No depletion  Clavicle and Acromion Bone Region No depletion  Scapular Bone Region No depletion  Dorsal Hand No depletion  Patellar Region No depletion  Anterior Thigh Region No depletion  Posterior Calf Region No depletion  Edema (RD Assessment) Mild  Hair Reviewed  Eyes Reviewed  Mouth Reviewed  Skin Reviewed  Nails Reviewed        Diet Order:   Diet Order             Diet renal/carb modified with fluid restriction Diet-HS Snack? Nothing; Fluid restriction: 1200 mL Fluid; Room service appropriate? Yes; Fluid consistency: Thin  Diet effective now                   EDUCATION NEEDS:   Education needs have been addressed  Skin:  Skin Assessment: Skin Integrity Issues: Skin Integrity Issues:: Stage II Stage II: Mid buttocks  Last BM:  Unknown  Height:   Ht Readings from Last 1 Encounters:  06/23/22 '6\' 2"'$  (1.88 m)    Weight:   Wt Readings from Last 1 Encounters:  06/25/22 131.1 kg    Ideal Body Weight:  86.4 kg  BMI:  Body mass index is 37.11 kg/m.  Estimated Nutritional Needs:   Kcal:  2500-3140 kcals  Protein:  125-155 gm  Fluid:  </= 1.2 L  Thalia Bloodgood, RD, LDN, CNSC

## 2022-06-25 NOTE — Progress Notes (Signed)
PROGRESS NOTE        PATIENT DETAILS Name: [name] Age: 66 y.o. Sex: male Date of Birth: 1955-11-03 Admit Date: 06/23/2022 Admitting Physician Etta Quill, DO NWG:[id number], Ovid Curd, PA-C  Brief Summary: Patient is a 66 y.o.  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  See below for further details.  Significant events: 10/14>> admit to [facility]-sepsis-suspected to be due to possible ongoing osteomyelitis of left foot.  Significant studies: 10/14>> CXR: No PNA 10/14>> CT chest/abdomen/pelvis: No acute abnormality, mildly enlarged left inguinal lymph nodes. 10/14>> CT left foot: Shallow soft tissue ulceration at the lateral aspect of the hindfoot with adjacent cellulitis.  Findings of chronic osteomyelitis involving the third/fourth metatarsal bases/adjacent cuboid.  No evidence of new erosion or aggressive periosteal reaction to suggest acute osteomyelitis.  Significant microbiology data: 10/14>> COVID PCR/influenza PCR: Negative 10/14>> blood culture: 1/2 group B strep  Procedures: None  Consults: Renal ID Orthopedics  Subjective: Looks significantly better-completely awake/alert.  No chest pain or shortness of breath.  Objective: Vitals: Blood pressure (!) 131/59, pulse 74, temperature 98.7 F (37.1 C), temperature source Oral, resp. rate 18, height '6\' 2"'$  (1.88 m), weight 125.7 kg, SpO2 99 %.   Exam: Gen Exam:Alert awake-not in any distress HEENT:atraumatic, normocephalic Chest: B/L clear to auscultation anteriorly CVS:S1S2 regular Abdomen:soft non tender, non distended Extremities: Right BKA-prosthesis in place.  Left foot bandaged-did not  open. Neurology: Non focal Skin: no rash   Pertinent Labs/Radiology:    Latest Ref Rng & Units 06/25/2022    1:57 AM 06/24/2022    1:03 AM 06/23/2022    4:46 PM  CBC  WBC 4.0 - 10.5 K/uL 9.3  13.4    Hemoglobin 13.0 - 17.0 g/dL 9.0  9.5  10.2   Hematocrit 39.0 - 52.0 % 26.3  28.7  30.0   Platelets 150 - 400 K/uL 133  134      Lab Results  Component Value Date   NA 135 06/25/2022   K 3.6 06/25/2022   CL 101 06/25/2022   CO2 22 06/25/2022      Assessment/Plan: Severe sepsis due to group B strep bacteremia Suspect left foot as the source of his bacteremia On IV penicillin G Echo pending ID consulted Evaluated by Dr. Ancil Boozer contemplating pursuing BKA-wants to discuss with his spouse  Acute metabolic encephalopathy Due to severe sepsis/bacteremia  Resolved-completely awake/alert  Left diabetic foot Chronic left lateral foot ulcer with underlying chronic osteomyelitis See above-Per prior discharge summary-refused BKA when offered.  Has completed 6 weeks of IV antibiotics per prior discharge summary. Dr. Tonia Brooms care team following BKA being contemplated-patient in process with discussing with family/spouse  PAD S/p left ray amputation, right BKA Has had multiple PCI's to left lower extremity in 2022. Continue statin  CAD-s/p PCI to RCA 2016 Denies any chest pain Continue statin  PAF Maintaining sinus rhythm Continue amiodarone Continue Eliquis  ESRD on HD TTS Missed HD on 10/14 Volume status/electrolytes stable Nephrology following and directing HD care.  Chronic HFpEF Volume status stable Volume removal with HD  BPH Continue Flomax  DM-2 (A1c 7.6 on 5/4) CBG stable Continue Semglee 15 units daily/SSI Follow/optimize  Recent Labs    06/24/22 1649 06/24/22 2221 06/25/22 0823  GLUCAP 177* 192* 150*      Chronic pain syndrome Peripheral neuropathy RLS Continue Requip/Lyrica/fentanyl patch and as needed oxycodone. Watch for  sedation.  Debility/deconditioning PT/OT eval  Obesity: Estimated body mass index is 35.58 kg/m as calculated from the following:   Height as of this encounter: '6\' 2"'$  (1.88 m).   Weight as of this encounter: 125.7 kg.   Code status:   Code Status: Full Code   DVT Prophylaxis: apixaban (ELIQUIS) tablet 5 mg    Family Communication:  Spouse-[name] Delores Schul-[phone] updated on 10/16   Disposition Plan: Status is: Inpatient Remains inpatient appropriate because: Sepsis physiology-unclear source with suspicion that this could be from left diabetic foot-possible bacteremia-awaiting cultures.  On empiric IV antibiotics.  Not stable for discharge.   Planned Discharge Destination:Home   Diet: Diet Order             Diet renal/carb modified with fluid restriction Diet-HS Snack? Nothing; Fluid restriction: 1200 mL Fluid; Room service appropriate? Yes; Fluid consistency: Thin  Diet effective now                     Antimicrobial agents: Anti-infectives (From admission, onward)    Start     Dose/Rate Route Frequency Ordered Stop   06/26/22 1200  vancomycin (VANCOCIN) IVPB 1000 mg/200 mL premix  Status:  Discontinued        1,000 mg 200 mL/hr over 60 Minutes Intravenous Every T-Th-Sa (Hemodialysis) 06/23/22 2149 06/24/22 1959   06/24/22 2200  penicillin G potassium 1 Million Units in dextrose 5 % 50 mL IVPB        1 Million Units 100 mL/hr over 30 Minutes Intravenous Every 6 hours 06/24/22 1959     06/24/22 1600  cefTRIAXone (ROCEPHIN) 2 g in sodium chloride 0.9 % 100 mL IVPB  Status:  Discontinued        2 g 200 mL/hr over 30 Minutes Intravenous Every 24 hours 06/23/22 2006 06/24/22 1959   06/23/22 2200  metroNIDAZOLE (FLAGYL) tablet 500 mg  Status:  Discontinued        500 mg Oral Every 12 hours 06/23/22 2006 06/24/22 1959   06/23/22 2030  vancomycin (VANCOCIN) 2,250 mg in sodium chloride 0.9 % 500 mL IVPB        2,250 mg 261.3 mL/hr over 120 Minutes Intravenous   Once 06/23/22 2029 06/24/22 0052   06/23/22 1615  cefTRIAXone (ROCEPHIN) 1 g in sodium chloride 0.9 % 100 mL IVPB        1 g 200 mL/hr over 30 Minutes Intravenous  Once 06/23/22 1606 06/23/22 1700        MEDICATIONS: Scheduled Meds:  amiodarone  100 mg Oral Daily   apixaban  5 mg Oral BID   atorvastatin  40 mg Oral QHS   Chlorhexidine Gluconate Cloth  6 each Topical Q0600   [START ON 06/26/2022] doxercalciferol  2 mcg Intravenous Q T,Th,Sa-HD   famotidine  20 mg Oral Daily   fentaNYL  1 patch Transdermal Y18H   folic acid  1 mg Oral Daily   [name]'s butt cream   Topical TID   insulin aspart  0-9 Units Subcutaneous TID WC   insulin glargine-yfgn  15 Units Subcutaneous QHS   loratadine  10 mg Oral Daily   metoCLOPramide  5 mg Oral TID AC  midodrine  10 mg Oral QODAY   nitroGLYCERIN  0.2 mg Transdermal Daily   pantoprazole  40 mg Oral Daily   pregabalin  100 mg Oral BID   rOPINIRole  2 mg Oral QHS   tamsulosin  0.4 mg Oral Daily   Continuous Infusions:  pencillin G potassium IV 1 Million Units (06/25/22 0525)   PRN Meds:.acetaminophen **OR** acetaminophen, linaclotide, ondansetron **OR** ondansetron (ZOFRAN) IV, mouth rinse, oxyCODONE-acetaminophen, perflutren lipid microspheres (DEFINITY) IV suspension, polyvinyl alcohol   I have personally reviewed following labs and imaging studies  LABORATORY DATA: CBC: Recent Labs  Lab 06/23/22 1404 06/23/22 1646 06/24/22 0103 06/25/22 0157  WBC 13.5*  --  13.4* 9.3  NEUTROABS 12.2*  --   --   --   HGB 11.3* 10.2* 9.5* 9.0*  HCT 33.0* 30.0* 28.7* 26.3*  MCV 93.0  --  94.7 92.0  PLT 141*  --  134* 133*     Basic Metabolic Panel: Recent Labs  Lab 06/23/22 1404 06/23/22 1646 06/24/22 0103 06/24/22 1349 06/25/22 0157  NA 139 137 139  --  135  K 4.1 4.4 4.2  --  3.6  CL 100  --  103  --  101  CO2 25  --  21*  --  22  GLUCOSE 352*  --  196*  --  223*  BUN 34*  --  35*  --  49*  CREATININE 6.07*  --  6.16*  --   7.28*  CALCIUM 8.1*  --  7.8*  --  7.7*  PHOS  --   --   --  4.7* 5.3*     GFR: Estimated Creatinine Clearance: 13.7 mL/min (A) (by C-G formula based on SCr of 7.28 mg/dL (H)).  Liver Function Tests: Recent Labs  Lab 06/23/22 1404 06/24/22 0103 06/25/22 0157  AST 20 23  --   ALT 16 14  --   ALKPHOS 133* 94  --   BILITOT 0.9 0.9  --   PROT 6.1* 5.0*  --   ALBUMIN 3.1* 2.4* 2.3*    No results for input(s): "LIPASE", "AMYLASE" in the last 168 hours. No results for input(s): "AMMONIA" in the last 168 hours.  Coagulation Profile: Recent Labs  Lab 06/23/22 1404 06/24/22 0103  INR 1.3* 1.5*     Cardiac Enzymes: No results for input(s): "CKTOTAL", "CKMB", "CKMBINDEX", "TROPONINI" in the last 168 hours.  BNP (last 3 results) No results for input(s): "PROBNP" in the last 8760 hours.  Lipid Profile: No results for input(s): "CHOL", "HDL", "LDLCALC", "TRIG", "CHOLHDL", "LDLDIRECT" in the last 72 hours.  Thyroid Function Tests: No results for input(s): "TSH", "T4TOTAL", "FREET4", "T3FREE", "THYROIDAB" in the last 72 hours.  Anemia Panel: No results for input(s): "VITAMINB12", "FOLATE", "FERRITIN", "TIBC", "IRON", "RETICCTPCT" in the last 72 hours.  Urine analysis:    Component Value Date/Time   COLORURINE YELLOW 06/23/2022 2015   APPEARANCEUR CLEAR 06/23/2022 2015   LABSPEC 1.013 06/23/2022 2015   PHURINE 8.0 06/23/2022 2015   GLUCOSEU >=500 (A) 06/23/2022 2015   HGBUR SMALL (A) 06/23/2022 2015   BILIRUBINUR NEGATIVE 06/23/2022 2015   KETONESUR NEGATIVE 06/23/2022 2015   PROTEINUR 100 (A) 06/23/2022 2015   UROBILINOGEN 0.2 01/14/2015 0346   NITRITE NEGATIVE 06/23/2022 2015   LEUKOCYTESUR NEGATIVE 06/23/2022 2015    Sepsis Labs: Lactic Acid, Venous    Component Value Date/Time   LATICACIDVEN 3.7 ([place]) 06/24/2022 0103    MICROBIOLOGY: Recent Results (from the past 240 hour(s))  Culture, blood (Routine x  2)     Status: None (Preliminary result)   Collection  Time: 06/23/22  2:04 PM   Specimen: BLOOD  Result Value Ref Range Status   Specimen Description BLOOD RIGHT ANTECUBITAL  Final   Special Requests   Final    BOTTLES DRAWN AEROBIC ONLY Blood Culture adequate volume   Culture   Final    NO GROWTH 2 DAYS Performed at [facility], [address], [place], [place] [address]    Report Status PENDING  Incomplete  Culture, blood (Routine x 2)     Status: Abnormal (Preliminary result)   Collection Time: 06/23/22  4:23 PM   Specimen: BLOOD RIGHT ARM  Result Value Ref Range Status   Specimen Description BLOOD RIGHT ARM  Final   Special Requests   Final    BOTTLES DRAWN AEROBIC AND ANAEROBIC Blood Culture adequate volume   Culture  Setup Time   Final    GRAM POSITIVE COCCI IN CHAINS AEROBIC BOTTLE ONLY CRITICAL RESULT CALLED TO, READ BACK BY AND VERIFIED WITH: [place] [id number] FCP    Culture (A)  Final    GROUP B STREP(S.AGALACTIAE)ISOLATED SUSCEPTIBILITIES TO FOLLOW Performed at [facility], [place] [address] [place], [place] [address]    Report Status PENDING  Incomplete  Resp Panel by RT-PCR (Flu A&B, Covid) Anterior Nasal Swab     Status: None   Collection Time: 06/23/22  4:23 PM   Specimen: Anterior Nasal Swab  Result Value Ref Range Status   SARS Coronavirus 2 by RT PCR NEGATIVE NEGATIVE Final    Comment: (NOTE) SARS-CoV-2 target nucleic acids are NOT DETECTED.  The SARS-CoV-2 RNA is generally detectable in upper respiratory specimens during the acute phase of infection. The lowest concentration of SARS-CoV-2 viral copies this assay can detect is 138 copies/mL. A negative result does not preclude SARS-Cov-2 infection and should not be used as the sole basis for treatment or other patient management decisions. A negative result may occur with  improper specimen collection/handling, submission of specimen other than nasopharyngeal swab, presence of viral mutation(s) within the areas targeted by this assay,  and inadequate number of viral copies(<138 copies/mL). A negative result must be combined with clinical observations, patient history, and epidemiological information. The expected result is Negative.  Fact Sheet for Patients:  EntrepreneurPulse.com.au  Fact Sheet for Healthcare Providers:  IncredibleEmployment.be  This test is no t yet approved or cleared by the [place] FDA and  has been authorized for detection and/or diagnosis of SARS-CoV-2 by FDA under an Emergency Use Authorization (EUA). This EUA will remain  in effect (meaning this test can be used) for the duration of the COVID-19 declaration under Section 564(b)(1) of the Act, 21 U.S.C.section 360bbb-3(b)(1), unless the authorization is terminated  or revoked sooner.       Influenza A by PCR NEGATIVE NEGATIVE Final   Influenza B by PCR NEGATIVE NEGATIVE Final    Comment: (NOTE) The Xpert Xpress SARS-CoV-2/FLU/RSV plus assay is intended as an aid in the diagnosis of influenza from Nasopharyngeal swab specimens and should not be used as a sole basis for treatment. Nasal washings and aspirates are unacceptable for Xpert Xpress SARS-CoV-2/FLU/RSV testing.  Fact Sheet for Patients: EntrepreneurPulse.com.au  Fact Sheet for Healthcare Providers: IncredibleEmployment.be  This test is not yet approved or cleared by the [place] FDA and has been authorized for detection and/or diagnosis of SARS-CoV-2 by FDA under an Emergency Use Authorization (EUA). This EUA will remain in  effect (meaning this test can be used) for the duration of the COVID-19 declaration under Section 564(b)(1) of the Act, 21 U.S.C. section 360bbb-3(b)(1), unless the authorization is terminated or revoked.  Performed at [facility], [place] [address], [place], [place] [address]   Blood Culture ID Panel (Reflexed)     Status: Abnormal   Collection Time: 06/23/22  4:23  PM  Result Value Ref Range Status   Enterococcus faecalis NOT DETECTED NOT DETECTED Final   Enterococcus Faecium NOT DETECTED NOT DETECTED Final   Listeria monocytogenes NOT DETECTED NOT DETECTED Final   Staphylococcus species NOT DETECTED NOT DETECTED Final   Staphylococcus aureus (BCID) NOT DETECTED NOT DETECTED Final   Staphylococcus epidermidis NOT DETECTED NOT DETECTED Final   Staphylococcus lugdunensis NOT DETECTED NOT DETECTED Final   Streptococcus species DETECTED (A) NOT DETECTED Final    Comment: CRITICAL RESULT CALLED TO, READ BACK BY AND VERIFIED WITH: [place] [id number] FCP    Streptococcus agalactiae DETECTED (A) NOT DETECTED Final    Comment: CRITICAL RESULT CALLED TO, READ BACK BY AND VERIFIED WITH: [place] [id number] FCP    Streptococcus pneumoniae NOT DETECTED NOT DETECTED Final   Streptococcus pyogenes NOT DETECTED NOT DETECTED Final   A.calcoaceticus-baumannii NOT DETECTED NOT DETECTED Final   Bacteroides fragilis NOT DETECTED NOT DETECTED Final   Enterobacterales NOT DETECTED NOT DETECTED Final   Enterobacter cloacae complex NOT DETECTED NOT DETECTED Final   Escherichia coli NOT DETECTED NOT DETECTED Final   Klebsiella aerogenes NOT DETECTED NOT DETECTED Final   Klebsiella oxytoca NOT DETECTED NOT DETECTED Final   Klebsiella pneumoniae NOT DETECTED NOT DETECTED Final   Proteus species NOT DETECTED NOT DETECTED Final   Salmonella species NOT DETECTED NOT DETECTED Final   Serratia marcescens NOT DETECTED NOT DETECTED Final   Haemophilus influenzae NOT DETECTED NOT DETECTED Final   Neisseria meningitidis NOT DETECTED NOT DETECTED Final   Pseudomonas aeruginosa NOT DETECTED NOT DETECTED Final   Stenotrophomonas maltophilia NOT DETECTED NOT DETECTED Final   Candida albicans NOT DETECTED NOT DETECTED Final   Candida auris NOT DETECTED NOT DETECTED Final   Candida glabrata NOT DETECTED NOT DETECTED Final   Candida krusei NOT DETECTED NOT DETECTED  Final   Candida parapsilosis NOT DETECTED NOT DETECTED Final   Candida tropicalis NOT DETECTED NOT DETECTED Final   Cryptococcus neoformans/gattii NOT DETECTED NOT DETECTED Final    Comment: Performed at [facility], [place]. [address] [place], [place] [address]    RADIOLOGY STUDIES/RESULTS: CT CHEST ABDOMEN PELVIS WO CONTRAST  Result Date: 06/23/2022 CLINICAL DATA:  Left foot osteomyelitis. Clinical concern for sepsis. Diabetes. EXAM: CT CHEST, ABDOMEN AND PELVIS WITHOUT CONTRAST TECHNIQUE: Multidetector CT imaging of the chest, abdomen and pelvis was performed following the standard protocol without IV contrast. RADIATION DOSE REDUCTION: This exam was performed according to the departmental dose-optimization program which includes automated exposure control, adjustment of the mA and/or kV according to patient size and/or use of iterative reconstruction technique. COMPARISON:  None Available. FINDINGS: CT CHEST FINDINGS Cardiovascular: Enlarged heart. Atheromatous calcifications, including the coronary arteries and aorta. Mediastinum/Nodes: No enlarged mediastinal, hilar, or axillary lymph nodes. Thyroid gland, trachea, and esophagus demonstrate no significant findings. Lungs/Pleura: Mild bibasilar dependent atelectasis. 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 Lower cervical spine fixation hardware. CT ABDOMEN PELVIS FINDINGS Hepatobiliary: No focal liver abnormality is seen. Status post cholecystectomy. No biliary dilatation. Pancreas: Mild pancreatic atrophy. Spleen: Normal in size without focal  abnormality. Adrenals/Urinary Tract: Normal appearing adrenal glands. Stable small exophytic right renal cyst. Unremarkable left kidney, ureters and urinary bladder. Stomach/Bowel: Descending and sigmoid colon diverticula. Mid sigmoid colon anastomosis. Anastomosis involving a dilated distal small bowel loop containing fecalized material. Small appendicoliths in the  proximal appendix without evidence of appendicitis. Unremarkable stomach. Vascular/Lymphatic: Atheromatous arterial calcifications without aneurysm. Multiple mildly enlarged left inguinal lymph nodes with central low density. The largest has a short axis diameter of 13 mm on image number 129/3, previously 9 mm. Reproductive: Prostate is unremarkable. Other: Small bilateral inguinal hernias containing fat. 3 supraumbilical ventral hernias containing fat. Midline infraumbilical scar and right lower quadrant anterior scar. Musculoskeletal: Interbody and pedicle screw and rod fusion at the L4 through S1 levels. Lumbar spine degenerative changes. IMPRESSION: 1. Multiple mildly enlarged left inguinal lymph nodes with central low density. This is nonspecific with differential considerations including adenitis, reactive adenopathy and metastatic adenopathy. 2. No acute abnormality in the abdomen or pelvis. 3. Cardiomegaly. 4.  Calcific coronary artery and aortic atherosclerosis. 5. Colonic diverticulosis. 6. Multiple ventral hernias containing fat. Aortic Atherosclerosis (ICD10-I70.0). Electronically Signed   By: Claudie Revering M.D.   On: 06/23/2022 17:51   CT Foot Left Wo Contrast  Result Date: 06/23/2022 CLINICAL DATA:  Foot ulcer, concern for osteomyelitis EXAM: CT OF THE LEFT FOOT WITHOUT CONTRAST TECHNIQUE: Multidetector CT imaging of the left foot was performed according to the standard protocol. Multiplanar CT image reconstructions were also generated. RADIATION DOSE REDUCTION: This exam was performed according to the departmental dose-optimization program which includes automated exposure control, adjustment of the mA and/or kV according to patient size and/or use of iterative reconstruction technique. COMPARISON:  X-ray 06/23/2022, MRI 01/12/2022 FINDINGS: Bones/Joint/Cartilage Bones are diffusely demineralized. 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 Cortical thickening and sclerosis of the third  and fourth metatarsal bases and to a lesser degree within the cuboid compatible with sequela of chronic osteomyelitis. No evidence of a new erosion or aggressive periosteal reaction. No fracture or dislocation. Similar degenerative changes of the foot. No tibiotalar or subtalar joint effusions. Ligaments Suboptimally assessed by CT. Muscles and Tendons Denervation and amputation changes of the musculotendinous structures of the foot. No acute findings. No appreciable tenosynovial fluid collection. Soft tissues Shallow soft tissue ulceration at the lateral aspect of the hindfoot in the region of the calcaneocuboid joint. Soft tissue edema at the lateral and dorsal aspects of the foot. No organized fluid collection. No soft tissue gas. IMPRESSION: 1. Shallow soft tissue ulceration at the lateral aspect of the hindfoot in the region of the calcaneocuboid joint with adjacent cellulitis. 2. Findings of chronic osteomyelitis involving the third and fourth metatarsal bases and adjacent cuboid. No evidence of a new erosion or aggressive periosteal reaction to suggest acute osteomyelitis by CT. Electronically Signed   By: Davina Poke D.O.   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 No recent fracture or dislocation is seen. Osteopenia is seen in bony structures. No focal lytic lesions are seen. Plantar spur is seen in calcaneus. Degenerative changes are noted in the intertarsal and tarsometatarsal joints. Arterial calcifications are seen in soft tissues. Overall, no significant interval changes are noted. IMPRESSION: No fracture or dislocation is seen. Postsurgical changes as described in the body of the report. There  are no focal lytic lesions. If there is clinical suspicion for osteomyelitis, follow-up MRI may be  considered. Electronically Signed   By: Elmer Picker M.D.   On: 06/23/2022 15:47   DG Chest 2 View  Result Date: 06/23/2022 CLINICAL DATA:  Suspected sepsis EXAM: CHEST - 2 VIEW COMPARISON:  01/11/2022 FINDINGS: The heart size and mediastinal contours are within normal limits. Both lungs are clear. The visualized skeletal structures are unremarkable. IMPRESSION: No active cardiopulmonary disease. Electronically Signed   By: Nelson Chimes M.D.   On: 06/23/2022 15:44     LOS: 2 days   Oren Binet, MD  [facility]    To contact the attending provider between 7A-7P or the covering provider during after hours 7P-7A, please log into the web site [email or web address] and access using universal [place] password for that web site. If you do not have the password, please call the hospital operator.  06/25/2022, 11:40 AM

## 2022-06-25 NOTE — Progress Notes (Signed)
Physical Therapy Evaluation Patient Details Name: [name] MRN: [id number] DOB: 07-Jan-1956 Today's Date: 06/25/2022  History of Present Illness  66 yo male with onset of fever and weakness was admitted 10/14, now has  dx sepsis with lactic acidosis.  Had AMS, potentially having L BKA.  Reports pain in R shoulder, R LE phantom pain, L foot pain, neck and back pain.  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  Pt is coming to hosp from a home environment, with help from family most of the time.  Follow acutely with looming issue of LLE surgery in which pt may be a B BK amputee.  Progress plans for dc as pt requires, given that he is potentially going to be dependent for the pivot to the chair.  Pt has a lot of equipment at home but will mainly need to consider his new limits, which may make progression on to CIR more advantageous than SNF.       Recommendations for follow up therapy are one component of a multi-disciplinary discharge planning process, led by the attending physician.  Recommendations may be updated based on patient status, additional functional criteria and insurance authorization.  Follow Up Recommendations Skilled nursing-short term rehab (<3 hours/day) Can patient physically be transported by private vehicle: No    Assistance Recommended at Discharge Frequent or constant Supervision/Assistance  Patient can return home with the following  Other (comment) (await surgery)    Equipment Recommendations None recommended by PT  Recommendations for Other Services       Functional Status  Assessment Patient has had a recent decline in their functional status and demonstrates the ability to make significant improvements in function in a reasonable and predictable amount of time.     Precautions / Restrictions Precautions Precautions: Fall Precaution Comments: awaiting L BK Restrictions Weight Bearing Restrictions: No Other Position/Activity Restrictions: has RLE prosthetic      Mobility  Bed Mobility Overal bed mobility: Needs Assistance Bed Mobility: Sidelying to Sit   Sidelying to sit: Min assist       General bed mobility comments: propping on L elbow at side of bed    Transfers Overall transfer level: Needs assistance   Transfers: Sit to/from Stand, Bed to chair/wheelchair/BSC Sit to Stand: Min guard, +2 physical assistance, +2 safety/equipment, From elevated surface     Squat pivot transfers: Min assist, +2 physical assistance, +2 safety/equipment     General transfer comment: pt is very limited for height of pivot due to pain on BLE's    Ambulation/Gait               General Gait Details: has not walked in years  Stairs            Wheelchair Mobility    Modified Rankin (Stroke Patients Only)       Balance Overall balance assessment: Needs assistance Sitting-balance support: Feet supported Sitting balance-Leahy Scale: Poor   Postural control: Left lateral lean, Posterior lean Standing balance support: During functional activity, Bilateral upper extremity supported Standing balance-Leahy Scale: Poor  Pertinent Vitals/Pain Pain Assessment Pain Assessment: No/denies pain    Home Living Family/patient expects to be discharged to:: Private residence Living Arrangements: Spouse/significant other;Children Available Help at Discharge: Family;Available 24 hours/day Type of Home: House Home Access: Ramped entrance       Home Layout: One level Home Equipment: Conservation officer, nature (2  wheels);Crutches;Cane - single point;Tub bench;Grab bars - toilet;Grab bars - tub/shower;Hand held shower head;Wheelchair - power;Wheelchair - manual;BSC/3in1;Rollator (4 wheels);Other (comment) (knee walker) Additional Comments: reports all equipment needed is at his home    Prior Function Prior Level of Function : Driving;Needs assist       Physical Assist : Mobility (physical) Mobility (physical): Transfers   Mobility Comments: transfers to wheelchair but cannot walk       Hand Dominance   Dominant Hand: Right    Extremity/Trunk Assessment   Upper Extremity Assessment Upper Extremity Assessment: Defer to OT evaluation    Lower Extremity Assessment Lower Extremity Assessment: Generalized weakness       Communication      Cognition Arousal/Alertness: Awake/alert Behavior During Therapy: WFL for tasks assessed/performed Overall Cognitive Status: Within Functional Limits for tasks assessed                                          General Comments General comments (skin integrity, edema, etc.): monitoring tolerance to move with sats, HR WFL    Exercises     Assessment/Plan    PT Assessment Patient needs continued PT services  PT Problem List Decreased strength;Decreased range of motion;Decreased activity tolerance;Decreased mobility;Decreased balance;Decreased coordination;Decreased cognition;Decreased safety awareness;Decreased knowledge of use of DME;Cardiopulmonary status limiting activity;Decreased skin integrity;Pain       PT Treatment Interventions DME instruction;Gait training;Stair training;Functional mobility training;Therapeutic exercise;Therapeutic activities;Balance training;Neuromuscular re-education;Patient/family education    PT Goals (Current goals can be found in the Care Plan section)  Acute Rehab PT Goals Patient Stated Goal: to go home and feel safe, reduce pain. PT Goal Formulation: With patient Time For Goal Achievement:  07/09/22 Potential to Achieve Goals: Good    Frequency Min 3X/week     Co-evaluation   Reason for Co-Treatment: Necessary to address cognition/behavior during functional activity;For patient/therapist safety;To address functional/ADL transfers           AM-PAC PT "6 Clicks" Mobility  Outcome Measure Help needed turning from your back to your side while in a flat bed without using bedrails?: A Lot Help needed moving from lying on your back to sitting on the side of a flat bed without using bedrails?: A Lot Help needed moving to and from a bed to a chair (including a wheelchair)?: A Lot Help needed standing up from a chair using your arms (e.g., wheelchair or bedside chair)?: A Lot Help needed to walk in hospital room?: A Lot Help needed climbing 3-5 steps with a railing? : Total 6 Click Score: 11    End of Session Equipment Utilized During Treatment: Gait belt Activity Tolerance: Patient limited by fatigue;Patient limited by pain Patient left: with call bell/phone within reach;in chair;with chair alarm set Nurse Communication: Mobility status PT Visit Diagnosis: Unsteadiness on feet (R26.81);Muscle weakness (generalized) (M62.81);Difficulty in walking, not elsewhere classified (R26.2);Other abnormalities of gait and mobility (R26.89);Other (comment);Pain (RLE prosthesis, LLE foot pain) Pain - Right/Left:  (Both) Pain - part of body: Ankle and joints of foot;Leg    Time: 6269-4854 PT Time Calculation (min) (ACUTE ONLY):  23 min   Charges:   PT Evaluation $PT Eval Moderate Complexity: 1 Mod         Ramond Dial 06/25/2022, 4:39 PM  Mee Hives, PT PhD Acute Rehab Dept. Number: [facility]

## 2022-06-25 NOTE — Progress Notes (Signed)
[facility] Pall[facility] Care Consult Note Telephone: [phone]  Fax: [phone]    Date of encounter: 06/22/2022 2:30PM PATIENT NAME: [name] [address] [place] [address]   [phone] (home)  DOB: 12/10/1955 MRN: [id number] PRIMARY CARE PROVIDER:    Fae [name],  [place] [address] [phone]  REFERRING PROVIDER:   [name], PA-C [address] [place],  [place] [address] [phone]  RESPONSIBLE PARTY:    Contact Information     Name Relation Home Work Mobile   Maue,[name] Spouse  [phone]   [name]   [phone]        I met face to face with patient and family in the home. Palliative Care was asked to follow this patient by consultation request of  Harold Bender, PA-C to address advance care planning and complex medical decision making. This is a follow up visit.                                   ASSESSMENT AND PLAN / RECOMMENDATIONS:   Advance Care Planning/Goals of Care: Goals include to maximize quality of life and symptom management. Patient/health care surrogate gave his/her permission to discuss. Our advance care planning conversation included a discussion about:    The value and importance of advance care planning  Experiences with loved ones who have been seriously ill or have died  Exploration of personal, cultural or spiritual beliefs that might influence medical decisions  Exploration of goals of care in the event of a sudden injury or illness  CODE STATUS: Full Code  Education provided on Palliative Medicine. We discussed restructuring of palliative program. Patient and wife express concern over pain management and who will provide his pain management. We discussed options going forward.   Symptom Management/Plan:  ESRD-patient continues HD on Tuesday/Thursday/Saturday. He reports feeling tired on dialysis days. He states his blood  pressure is not dropping as much; receiving midodrine. He still endorses occasional nausea. Nausea-continue Zofran 4 mg every 6 hours PRN.   Pain- chronic pain, vascular wound/left foot pain, hx of osteomyelitis. Currently managed with regimen. Continue fentanyl 50 mcg every 72 hours, percocet TID PRN, Lyrica 100 mg BID directed.  Chronic left foot vascular wound-receiving HH SN for wound care; follows up with [name] monthly.   Follow up Palliative Care Visit: Palliative care will continue to follow for complex medical decision making, advance care planning, and clarification of goals. Return prn.  This visit was coded based on medical decision making (MDM).  PPS: 40%  HOSPICE ELIGIBILITY/DIAGNOSIS: TBD  Chief Complaint: Palliative Medicine follow up visit.   HISTORY OF PRESENT ILLNESS:  [name] is a 66 y.o. 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    Patient states he has been tolerating HD; he endorses fatigue on dialysis days. He states his pain has been managed with current regimen. Reports 5/10 is manageable for him; pain can be severe at times. HH SN is visiting twice a week for wound care. He endorses occasional nausea; taking Zofran. His appetite has been good. No recent falls, ED visits or hospitalizations.   History  obtained from review of EMR, discussion with primary team, and interview with family, facility staff/caregiver and/or [name].  I reviewed available labs, medications, imaging, studies and related documents from the EMR.  Records reviewed and summarized above.   ROS  A 10-Point ROS is negative, except for the pertinent positives/negatives detailed  per the HPI.  Physical Exam: Pulse 80, resp 20 even/unlabored, b/p 164/80, sats 97% on room air Constitutional: NAD General: frail appearing EYES: anicteric sclera, lids intact, no discharge  ENMT: intact hearing, oral mucous membranes moist, dentition intact CV: S1S2, RRR, no LE edema Pulmonary: LCTA, no increased work of breathing, no cough, room air Abdomen: normo-active BS + 4 quadrants, soft and non tender GU: deferred MSK: moves all extremities, right BKA Skin: no rashes or wounds on visible skin Neuro: +generalized weakness Psych: non-anxious affect, A and O x 3 Hem/lymph/immuno: no widespread bruising   Thank you for the opportunity to participate in the care of [name].  The palliative care team will continue to follow. Please call our office at [phone] if we can be of additional assistance.   Harold Slocumb, NP   COVID-19 PATIENT SCREENING TOOL Asked and negative response unless otherwise noted:   Have you had symptoms of covid, tested positive or been in contact with someone with symptoms/positive test in the past 5-10 days? No

## 2022-06-25 NOTE — Evaluation (Signed)
Occupational Therapy Evaluation Patient Details Name: [name] MRN: [id number] DOB: 1955/12/08 Today's Date: 06/25/2022   History of Present Illness 66 yo male with onset of fever and weakness was admitted 10/14, now has  dx sepsis with lactic acidosis.  Had AMS, potentially having L BKA.  Reports pain in R shoulder, R LE phantom pain, L foot pain, neck and back pain.  PMHx:  chronic left foot ischemic ulcer infection on chronic doxycycline, PVD status post stenting and ballooning and status post right BKA, ESRD on HD TTS, IDDM, COPD, OSA not tolerating CPAP, HTN, HLD, chronic multijoint OA on narcotics,   Clinical Impression   Pt independent at baseline with ADLs and functional mobility, used w/c and transferred mostly at home. Pt currently needing set up -mod A for ADLs, min A for bed mobility, and min guard +2 for transfers with RW. Pt with impaired sitting balance, needing mod A to remain upright to don sock at EOB. Pt with tentative surgery for later this week, will follow acutely.     Recommendations for follow up therapy are one component of a multi-disciplinary discharge planning process, led by the attending physician.  Recommendations may be updated based on patient status, additional functional criteria and insurance authorization.   Follow Up Recommendations  Other (comment) (TBD after surgery)    Assistance Recommended at Discharge Intermittent Supervision/Assistance  Patient can return home with the following A little help with walking and/or transfers;A little help with bathing/dressing/bathroom;Assistance with cooking/housework;Direct supervision/assist for financial management;Direct supervision/assist for medications management;Assist for transportation;Help with stairs or ramp for entrance    Functional Status Assessment  Patient has had a recent decline in their functional status and demonstrates the ability to make significant improvements in function in a reasonable  and predictable amount of time.  Equipment Recommendations  Other (comment) (TBD)    Recommendations for Other Services PT consult     Precautions / Restrictions Precautions Precautions: Fall Precaution Comments: tentative L BKA 10/18 Restrictions Weight Bearing Restrictions: No Other Position/Activity Restrictions: has RLE prosthetic      Mobility Bed Mobility Overal bed mobility: Needs Assistance Bed Mobility: Sidelying to Sit   Sidelying to sit: Min assist       General bed mobility comments: pt with L lateral lean    Transfers Overall transfer level: Needs assistance Equipment used: Rolling walker (2 wheels) Transfers: Sit to/from Stand, Bed to chair/wheelchair/BSC Sit to Stand: Min guard, +2 safety/equipment   Squat pivot transfers: Min guard, +2 safety/equipment              Balance Overall balance assessment: Needs assistance Sitting-balance support: Feet unsupported, Bilateral upper extremity supported Sitting balance-Leahy Scale: Poor Sitting balance - Comments: L lateral lean and posterior lean Postural control: Left lateral lean, Posterior lean Standing balance support: During functional activity, Reliant on assistive device for balance Standing balance-Leahy Scale: Poor Standing balance comment: reliant on external support                           ADL either performed or assessed with clinical judgement   ADL Overall ADL's : Needs assistance/impaired Eating/Feeding: Set up   Grooming: Set up   Upper Body Bathing: Minimal assistance   Lower Body Bathing: Moderate assistance   Upper Body Dressing : Minimal assistance   Lower Body Dressing: Moderate assistance   Toilet Transfer: Min guard;+2 for safety/equipment;Ambulation;Squat-pivot   Toileting- Water quality scientist and Hygiene: Min guard  Functional mobility during ADLs: Min guard;+2 for safety/equipment       Vision   Vision Assessment?: No apparent visual  deficits     Perception     Praxis      Pertinent Vitals/Pain Pain Assessment Pain Assessment: No/denies pain     Hand Dominance Right   Extremity/Trunk Assessment Upper Extremity Assessment Upper Extremity Assessment: Overall WFL for tasks assessed   Lower Extremity Assessment Lower Extremity Assessment: Defer to PT evaluation   Cervical / Trunk Assessment Cervical / Trunk Assessment: Normal   Communication Communication Communication: No difficulties   Cognition                                             General Comments  VSS on RA    Exercises     Shoulder Instructions      Home Living Family/patient expects to be discharged to:: Private residence Living Arrangements: Spouse/significant other;Children Available Help at Discharge: Family;Available 24 hours/day Type of Home: House Home Access: Ramped entrance     Home Layout: One level     Bathroom Shower/Tub: Walk-in shower;Tub/shower unit   Bathroom Toilet: Handicapped height Bathroom Accessibility: Yes   Home Equipment: Conservation officer, nature (2 wheels);Crutches;Cane - single point;Tub bench;Grab bars - toilet;Grab bars - tub/shower;Hand held shower head;Wheelchair - power;Wheelchair - manual;BSC/3in1          Prior Functioning/Environment Prior Level of Function : Driving;Needs assist             Mobility Comments: transfers, however is primarily using w/c ADLs Comments: reports ind        OT Problem List: Decreased strength;Decreased range of motion;Decreased activity tolerance;Impaired balance (sitting and/or standing);Decreased safety awareness;Decreased knowledge of use of DME or AE      OT Treatment/Interventions: Self-care/ADL training;Therapeutic exercise;Energy conservation;DME and/or AE instruction;Therapeutic activities;Patient/family education;Balance training    OT Goals(Current goals can be found in the care plan section) Acute Rehab OT Goals Patient Stated  Goal: none stated OT Goal Formulation: With patient Time For Goal Achievement: 07/09/22 Potential to Achieve Goals: Good ADL Goals Pt Will Perform Upper Body Dressing: with supervision;sitting Pt Will Perform Lower Body Dressing: with supervision;sitting/lateral leans;bed level Pt Will Transfer to Toilet: with supervision;bedside commode;with transfer board Additional ADL Goal #1: pt will complete bed mobility with supervision in prep for ADLs  OT Frequency: Min 2X/week    Co-evaluation PT/OT/SLP Co-Evaluation/Treatment: Yes Reason for Co-Treatment: For patient/therapist safety;To address functional/ADL transfers   OT goals addressed during session: ADL's and self-care      AM-PAC OT "6 Clicks" Daily Activity     Outcome Measure Help from another person eating meals?: None Help from another person taking care of personal grooming?: A Little Help from another person toileting, which includes using toliet, bedpan, or urinal?: A Lot Help from another person bathing (including washing, rinsing, drying)?: A Lot Help from another person to put on and taking off regular upper body clothing?: A Little Help from another person to put on and taking off regular lower body clothing?: A Lot 6 Click Score: 16   End of Session Equipment Utilized During Treatment: Gait belt;Rolling walker (2 wheels) Nurse Communication: Mobility status  Activity Tolerance: Patient tolerated treatment well Patient left: in chair;with call bell/phone within reach  OT Visit Diagnosis: Unsteadiness on feet (R26.81);Other abnormalities of gait and mobility (R26.89);Muscle weakness (generalized) (M62.81)  Time: 9924-2683 OT Time Calculation (min): 23 min Charges:  OT General Charges $OT Visit: 1 Visit OT Evaluation $OT Eval Low Complexity: 1 Low  Lynnda Child, OTD, OTR/L Acute Rehab [phone]) [phone] [place] 06/25/2022, 12:38 PM

## 2022-06-25 NOTE — Progress Notes (Signed)
[facility] Progress Note   Subjective:   Patient seen and examined at bedside.  Feeling better this AM.  Denies CP, SOB, abdominal pain and n/v/d.   Objective Vitals:   06/24/22 1947 06/24/22 2333 06/25/22 0345 06/25/22 0827  BP: (!) 154/49 (!) 154/51 (!) 122/51 (!) 131/59  Pulse: 88 82 70 74  Resp: (!) '28 16 18 18  '$ Temp: 99.7 F (37.6 C) 98.7 F (37.1 C) 98.6 F (37 C) 98.7 F (37.1 C)  TempSrc:  Oral Oral Oral  SpO2:  96% 97% 99%  Weight:      Height:       Physical Exam General:WDWN male in NAD Heart:RRR, no mrg Lungs:+crackles on L, otherwise CTAB, nml WOB Abdomen:soft, NTND Extremities:2+ edema on L, L foot dressed, R BKA Dialysis Access: RU AVF +b/t   [facility] Weights   06/23/22 2208 06/24/22 1946  Weight: 117.9 kg 125.7 kg    Intake/Output Summary (Last 24 hours) at 06/25/2022 0850 Last data filed at 06/25/2022 0345 Gross per 24 hour  Intake 290 ml  Output 950 ml  Net -660 ml    Additional Objective Labs: Basic Metabolic Panel: Recent Labs  Lab 06/23/22 1404 06/23/22 1646 06/24/22 0103 06/24/22 1349 06/25/22 0157  NA 139 137 139  --  135  K 4.1 4.4 4.2  --  3.6  CL 100  --  103  --  101  CO2 25  --  21*  --  22  GLUCOSE 352*  --  196*  --  223*  BUN 34*  --  35*  --  49*  CREATININE 6.07*  --  6.16*  --  7.28*  CALCIUM 8.1*  --  7.8*  --  7.7*  PHOS  --   --   --  4.7* 5.3*   Liver Function Tests: Recent Labs  Lab 06/23/22 1404 06/24/22 0103 06/25/22 0157  AST 20 23  --   ALT 16 14  --   ALKPHOS 133* 94  --   BILITOT 0.9 0.9  --   PROT 6.1* 5.0*  --   ALBUMIN 3.1* 2.4* 2.3*   No results for input(s): "LIPASE", "AMYLASE" in the last 168 hours. CBC: Recent Labs  Lab 06/23/22 1404 06/23/22 1646 06/24/22 0103 06/25/22 0157  WBC 13.5*  --  13.4* 9.3  NEUTROABS 12.2*  --   --   --   HGB 11.3* 10.2* 9.5* 9.0*  HCT 33.0* 30.0* 28.7* 26.3*  MCV 93.0  --  94.7 92.0  PLT 141*  --  134* 133*   Blood Culture     Component Value Date/Time   SDES BLOOD RIGHT ARM 06/23/2022 1623   SPECREQUEST  06/23/2022 1623    BOTTLES DRAWN AEROBIC AND ANAEROBIC Blood Culture adequate volume   CULT (A) 06/23/2022 1623    GROUP B STREP(S.AGALACTIAE)ISOLATED SUSCEPTIBILITIES TO FOLLOW Performed at [facility] [address], [place], [place] [address]    REPTSTATUS PENDING 06/23/2022 1623    Cardiac Enzymes: No results for input(s): "CKTOTAL", "CKMB", "CKMBINDEX", "TROPONINI" in the last 168 hours. CBG: Recent Labs  Lab 06/24/22 0750 06/24/22 1156 06/24/22 1649 06/24/22 2221 06/25/22 0823  GLUCAP 162* 198* 177* 192* 150*   Iron Studies: No results for input(s): "IRON", "TIBC", "TRANSFERRIN", "FERRITIN" in the last 72 hours. Lab Results  Component Value Date   INR 1.5 (H) 06/24/2022   INR 1.3 (H) 06/23/2022   INR 1.2 01/11/2022   Studies/Results: CT CHEST ABDOMEN PELVIS WO  CONTRAST  Result Date: 06/23/2022 CLINICAL DATA:  Left foot osteomyelitis. Clinical concern for sepsis. Diabetes. EXAM: CT CHEST, ABDOMEN AND PELVIS WITHOUT CONTRAST TECHNIQUE: Multidetector CT imaging of the chest, abdomen and pelvis was performed following the standard protocol without IV contrast. RADIATION DOSE REDUCTION: This exam was performed according to the departmental dose-optimization program which includes automated exposure control, adjustment of the mA and/or kV according to patient size and/or use of iterative reconstruction technique. COMPARISON:  None Available. FINDINGS: CT CHEST FINDINGS Cardiovascular: Enlarged heart. Atheromatous calcifications, including the coronary arteries and aorta. Mediastinum/Nodes: No enlarged mediastinal, hilar, or axillary lymph nodes. Thyroid gland, trachea, and esophagus demonstrate no significant findings. Lungs/Pleura: Mild bibasilar dependent atelectasis. 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 Lower cervical spine fixation hardware. CT ABDOMEN  PELVIS FINDINGS Hepatobiliary: No focal liver abnormality is seen. Status post cholecystectomy. No biliary dilatation. Pancreas: Mild pancreatic atrophy. Spleen: Normal in size without focal abnormality. Adrenals/Urinary Tract: Normal appearing adrenal glands. Stable small exophytic right renal cyst. Unremarkable left kidney, ureters and urinary bladder. Stomach/Bowel: Descending and sigmoid colon diverticula. Mid sigmoid colon anastomosis. Anastomosis involving a dilated distal small bowel loop containing fecalized material. Small appendicoliths in the proximal appendix without evidence of appendicitis. Unremarkable stomach. Vascular/Lymphatic: Atheromatous arterial calcifications without aneurysm. Multiple mildly enlarged left inguinal lymph nodes with central low density. The largest has a short axis diameter of 13 mm on image number 129/3, previously 9 mm. Reproductive: Prostate is unremarkable. Other: Small bilateral inguinal hernias containing fat. 3 supraumbilical ventral hernias containing fat. Midline infraumbilical scar and right lower quadrant anterior scar. Musculoskeletal: Interbody and pedicle screw and rod fusion at the L4 through S1 levels. Lumbar spine degenerative changes. IMPRESSION: 1. Multiple mildly enlarged left inguinal lymph nodes with central low density. This is nonspecific with differential considerations including adenitis, reactive adenopathy and metastatic adenopathy. 2. No acute abnormality in the abdomen or pelvis. 3. Cardiomegaly. 4.  Calcific coronary artery and aortic atherosclerosis. 5. Colonic diverticulosis. 6. Multiple ventral hernias containing fat. Aortic Atherosclerosis (ICD10-I70.0). Electronically Signed   By: Claudie Revering M.D.   On: 06/23/2022 17:51   CT Foot Left Wo Contrast  Result Date: 06/23/2022 CLINICAL DATA:  Foot ulcer, concern for osteomyelitis EXAM: CT OF THE LEFT FOOT WITHOUT CONTRAST TECHNIQUE: Multidetector CT imaging of the left foot was performed  according to the standard protocol. Multiplanar CT image reconstructions were also generated. RADIATION DOSE REDUCTION: This exam was performed according to the departmental dose-optimization program which includes automated exposure control, adjustment of the mA and/or kV according to patient size and/or use of iterative reconstruction technique. COMPARISON:  X-ray 06/23/2022, MRI 01/12/2022 FINDINGS: Bones/Joint/Cartilage Bones are diffusely demineralized. 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 Cortical thickening and sclerosis of the third and fourth metatarsal bases and to a lesser degree within the cuboid compatible with sequela of chronic osteomyelitis. No evidence of a new erosion or aggressive periosteal reaction. No fracture or dislocation. Similar degenerative changes of the foot. No tibiotalar or subtalar joint effusions. Ligaments Suboptimally assessed by CT. Muscles and Tendons Denervation and amputation changes of the musculotendinous structures of the foot. No acute findings. No appreciable tenosynovial fluid collection. Soft tissues Shallow soft tissue ulceration at the lateral aspect of the hindfoot in the region of the calcaneocuboid joint. Soft tissue edema at the lateral and dorsal aspects of the foot. No organized fluid collection. No soft tissue gas. IMPRESSION: 1. Shallow soft tissue ulceration at the lateral aspect of the hindfoot in the  region of the calcaneocuboid joint with adjacent cellulitis. 2. Findings of chronic osteomyelitis involving the third and fourth metatarsal bases and adjacent cuboid. No evidence of a new erosion or aggressive periosteal reaction to suggest acute osteomyelitis by CT. Electronically Signed   By: Davina Poke D.O.   On: 06/23/2022 17:51   DG Foot 2 Views Left  Result Date: 06/23/2022 CLINICAL DATA:  Foot ulcer EXAM: LEFT FOOT - 2 VIEW COMPARISON:  01/11/2022 FINDINGS: There is previous amputation of left fourth and fifth toes along with  fifth metatarsal. No recent fracture or dislocation is seen. Osteopenia is seen in bony structures. No focal lytic lesions are seen. Plantar spur is seen in calcaneus. Degenerative changes are noted in the intertarsal and tarsometatarsal joints. Arterial calcifications are seen in soft tissues. Overall, no significant interval changes are noted. IMPRESSION: No fracture or dislocation is seen. Postsurgical changes as described in the body of the report. There are no focal lytic lesions. If there is clinical suspicion for osteomyelitis, follow-up MRI may be considered. Electronically Signed   By: Elmer Picker M.D.   On: 06/23/2022 15:47   DG Chest 2 View  Result Date: 06/23/2022 CLINICAL DATA:  Suspected sepsis EXAM: CHEST - 2 VIEW COMPARISON:  01/11/2022 FINDINGS: The heart size and mediastinal contours are within normal limits. Both lungs are clear. The visualized skeletal structures are unremarkable. IMPRESSION: No active cardiopulmonary disease. Electronically Signed   By: Nelson Chimes M.D.   On: 06/23/2022 15:44    Medications:  pencillin G potassium IV 1 Million Units (06/25/22 0525)    amiodarone  100 mg Oral Daily   apixaban  5 mg Oral BID   atorvastatin  40 mg Oral QHS   Chlorhexidine Gluconate Cloth  6 each Topical Q0600   [START ON 06/26/2022] doxercalciferol  2 mcg Intravenous Q T,Th,Sa-HD   famotidine  20 mg Oral Daily   fentaNYL  1 patch Transdermal H60V   folic acid  1 mg Oral Daily   [name]'s butt cream   Topical TID   insulin aspart  0-9 Units Subcutaneous TID WC   insulin glargine-yfgn  15 Units Subcutaneous QHS   loratadine  10 mg Oral Daily   metoCLOPramide  5 mg Oral TID AC   midodrine  10 mg Oral QODAY   nitroGLYCERIN  0.2 mg Transdermal Daily   pantoprazole  40 mg Oral Daily   pregabalin  100 mg Oral BID   rOPINIRole  2 mg Oral QHS   tamsulosin  0.4 mg Oral Daily    Dialysis Orders: [facility] 4h 58mn  450/800   112kg   2/2 bath  Hep none  LUA AVF -  last HD 10/12, post wt 120kg - comes off 5-8kg over usually - mircera 30 mg q 4 wks, last 10/7 - doxercalciferol 2 ug tiw IV tts       BP 120- 160 / 60- 75,  HR 68- 98, RR 14- 23  tmax 103    Na 139  K 4 .2  BUN 35  Cr 6.1       CXR 10/14 - IMPRESSION: No active cardiopulmonary disease.     Assessment/ Plan: Sepsis w/Bacteremia - w/ temp 103, high HR/ RR, L leg erythema c/w cellulitis.  Blood cultures + Group B strep. ABX narrowed to PEN G per pharm. 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 S/p 6 wks IV abx per prior dc summary. Dr. DSharol Givenrecommends  BKA.  ESRD - on HD TTS. Missed last HD. Plan for HD off schedule today then resume TTS on 10/17.  PAD - sp R BKA, L ray amp CAD h/o PCI PAF - in NSR here, per pmd  HTN/ vol  - takes norvasc at home, and pre HD midodrine. Does not get close to his dry wt at OP HD, coming off 5-8 kg over. Large gains. LE edema but no respiratory issues.  Plan for max UF as tolerated.  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 ESA dosed q4wks, if Hgb continues to drop will give early.   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 Nutrition - Renal Diet w/fluid restrictions   Jen Mow, PA-C [place] 06/25/2022,8:50 AM  LOS: 2 days

## 2022-06-25 NOTE — Consult Note (Signed)
[facility]                                                                                       Patient Identification: Patient Name: [name] MRN: [id number] [place] Date: 06/23/2022  1:28 PM Today's Date: 06/25/2022 Reason for consult: DFU/bacteremia  Requesting provider: Oren Binet   Principal Problem:   Severe sepsis with lactic acidosis ([place]) Active Problems:   Cellulitis of left lower extremity   CAD (dz of distal, mid and proximal RCA with implantation of 3 overlapping drug-eluting stent,)   Chronic diastolic heart failure, NYHA class 2 (HCC)   Insulin dependent type 2 diabetes mellitus ([place])   Infected diabetic left foot ulcer with chronic osteomyelitis   PAD (peripheral artery disease) (HCC)   Essential hypertension   ESRD on hemodialysis (HCC)   Chronic osteomyelitis (HCC)   Atrial fibrillation, chronic (HCC)   Decubitus ulcer of buttock   Antibiotics:  Vancomycin 10/14-c Ceftriaxone 10/14-10/15, Pen G 10/15-c Metonidazole 10/14-c  Lines/Hardware: RT TKA  Assessment 66 year old male with a PMH of COPD, DM with neuropathy, A-fib on AC DJD GERD/hiatal hernia, CAD, PAD s/p CEA, RT BKA, s/p left fourth and fifth ray amputation followed by excision of base of fifth metatarsal and partial cuboid in 2017, hyperlipidemia, hypertension, anterior cervical fusion, right TKA, cervical and lumbar laminectomy, 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 ID engaged for Group B strep bacteremia.  Recommendations  Will switch IV pen G to IV cefazolin to be doseed with HD Fu repeat blood cultures for clearance Fu TTE Fu plans for amputation  Monitor CBC and BMP   Following   Rest of the management as per the primary team. Please call with questions or concerns.  Thank you for the consult  Rosiland Oz, MD Infectious Disease Physician [facility] [address] 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  Per wife patient started acting confused and not answering questions appropriately and hence, brought to ED.   Patient is a poor historian and does not remember the events leading to the ED.  At ED, febrile, tachycardic with some URI symptoms with concern for sepsis secondary to cellulitis of chronic left leg wound Labs remarkable for BG 352, WBC 13.5 platelets 141  Imagings as below Seen  by orthopedics, recommended left BKA, however patient was to discuss with his wife  ROS: all systems reviewed with pertinent positives and negatives as listed above   Past Medical History:  Diagnosis Date   Carotid artery occlusion 11/10/10   LEFT CAROTID ENDARTERECTOMY   Chronic kidney disease    Complication of anesthesia    BP WENT UP AT [facility] "   COPD (chronic obstructive pulmonary disease) ([place])    pt denies this dx as of 06/01/20 - no inhaler    Diabetes mellitus without complication ([place])     Diverticulitis    Diverticulosis of colon (without mention of hemorrhage)    DJD (degenerative joint disease)    knees/hands/feet/back/neck   Fatty liver    Full dentures    GERD (gastroesophageal reflux disease)    H/O hiatal hernia    History of blood transfusion    with a past surical procedure per patient 06/01/20   Hyperlipidemia    Hypertension    Neuromuscular disorder ([place])    peripheral neuropathy   Non-pressure chronic ulcer of other part of left foot limited to breakdown of skin ([place]) 11/12/2016   Osteomyelitis ([place])    left 5th metatarsal   PAD (peripheral artery disease) ([place])    Distal aortogram June 2012. Atherectomy left popliteal artery July 2012.    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   Unstable angina ([place]) 09/16/2018   Wears glasses    Past Surgical History:  Procedure Laterality Date   ABDOMINAL AORTOGRAM W/LOWER EXTREMITY N/A 06/23/2021   Procedure: ABDOMINAL AORTOGRAM W/LOWER EXTREMITY;  Surgeon: Nigel Mormon, MD;  Location: [place] [facility];  Service: Cardiovascular;  Laterality: N/A;   AMPUTATION  11/05/2011   Procedure: AMPUTATION RAY;  Surgeon: Wylene Simmer, MD;  Location: [place];  Service: Orthopedics;  Laterality: Right;  Amputation of Right 4&5th Toes   AMPUTATION Left 11/26/2012   Procedure: AMPUTATION RAY;  Surgeon: Wylene Simmer, MD;  Location: [place];  Service: Orthopedics;  Laterality: Left;  fourth ray amputation   AMPUTATION Right 08/27/2014   Procedure: Transmetatarsal Amputation;  Surgeon: Newt Minion, MD;  Location: [place];  Service: Orthopedics;  Laterality: Right;   AMPUTATION Right 01/14/2015   Procedure: AMPUTATION BELOW KNEE;  Surgeon: Newt Minion, MD;  Location: [place];  Service: Orthopedics;  Laterality: Right;   AMPUTATION Left 10/21/2015   Procedure: Left Foot 5th Ray Amputation;  Surgeon: Newt Minion,  MD;  Location: [place];  Service: Orthopedics;  Laterality: Left;   ANTERIOR FUSION CERVICAL SPINE  02/06/06   C4-5, C5-6, C6-7; SURGEON DR. MAX COHEN   AV FISTULA PLACEMENT Left 06/02/2020   Procedure: ARTERIOVENOUS (AV) FISTULA CREATION LEFT;  Surgeon: Waynetta Sandy, MD;  Location: [place];  Service: Vascular;  Laterality: Left;   BACK SURGERY     x 3   [place] Left 07/21/2020   Procedure: LEFT UPPER ARM ATERIOVENOUS SUPERFISTULALIZATION;  Surgeon: Waynetta Sandy, MD;  Location: [place];  Service: Vascular;  Laterality: Left;   BELOW KNEE LEG AMPUTATION Right    CARDIAC CATHETERIZATION  10/31/04   2009   CAROTID ENDARTERECTOMY  11/10/10   CAROTID ENDARTERECTOMY Left 11/10/2010   Subtotal occlusion of left internal carotid artery with left hemispheric transient ischemic attacks.   CAROTID STENT  CARPAL TUNNEL RELEASE Right 10/21/2013   Procedure: RIGHT CARPAL TUNNEL RELEASE;  Surgeon: Wynonia Sours, MD;  Location: [place];  Service: Orthopedics;  Laterality: Right;   CHOLECYSTECTOMY     COLON SURGERY     COLONOSCOPY     COLOSTOMY REVERSAL  05/21/2018   ileostomy reversal   CYSTOSCOPY WITH STENT PLACEMENT Bilateral 01/13/2018   Procedure: CYSTOSCOPY WITH BILATERAL URETERAL CATHETER PLACEMENT;  Surgeon: Ardis Hughs, MD;  Location[facility];  Service: Urology;  Laterality: Bilateral;   ESOPHAGEAL MANOMETRY Bilateral 07/19/2014   Procedure: ESOPHAGEAL MANOMETRY (EM);  Surgeon: Jerene Bears, MD;  [facility];  Service: Gastroenterology;  Laterality: Bilateral;   EYE SURGERY Bilateral 2020   cataract   FEMORAL ARTERY STENT     x6   FINGER SURGERY     FOOT SURGERY  04/25/2016    EXCISION BASE 5TH METATARSAL AND PARTIAL CUBOID LEFT FOOT   HERNIA REPAIR     LEFT INGUINAL AND UMBILICAL REPAIRS   HERNIA REPAIR     I & D EXTREMITY Left 04/25/2016   Procedure: EXCISION BASE 5TH METATARSAL AND PARTIAL CUBOID LEFT FOOT;  Surgeon:  Newt Minion, MD;  Location: [place];  Service: Orthopedics;  Laterality: Left;   ILEOSTOMY  01/13/2018   Procedure: ILEOSTOMY;  Surgeon: Clovis Riley, MD;  Location[facility];  Service: General;;   ILEOSTOMY CLOSURE N/A 05/21/2018   Procedure: ILEOSTOMY REVERSAL ERAS PATHWAY;  Surgeon: Clovis Riley, MD;  Location: [place];  Service: General;  Laterality: N/A;   IR RADIOLOGIST EVAL & MGMT  11/19/2017   IR RADIOLOGIST EVAL & MGMT  12/03/2017   IR RADIOLOGIST EVAL & MGMT  12/18/2017   JOINT REPLACEMENT Right 2001   Total knee   LAMINECTOMY     X 3 LUMBAR AND X 2 CERVICAL SPINE OPERATIONS   LAPAROSCOPIC CHOLECYSTECTOMY W/ CHOLANGIOGRAPHY  11/09/04   SURGEON DR. [place] CATH AND CORONARY ANGIOGRAPHY N/A 09/16/2018   Procedure: LEFT HEART CATH AND CORONARY ANGIOGRAPHY;  Surgeon: Nigel Mormon, MD;  Location: [place] [facility];  Service: Cardiovascular;  Laterality: N/A;   LEFT HEART CATHETERIZATION WITH CORONARY ANGIOGRAM N/A 10/29/2014   Procedure: LEFT HEART CATHETERIZATION WITH CORONARY ANGIOGRAM;  Surgeon: Laverda Page, MD;  Location: [facility];  Service: Cardiovascular;  Laterality: N/A;   LIGATION OF COMPETING BRANCHES OF ARTERIOVENOUS FISTULA Left 07/21/2020   Procedure: LIGATION OF COMPETING BRANCHES OF LEFT UPPER ARM ARTERIOVENOUS FISTULA;  Surgeon: Waynetta Sandy, MD;  Location: [place];  Service: Vascular;  Laterality: Left;   LOWER EXTREMITY ANGIOGRAM N/A 03/19/2012   Procedure: LOWER EXTREMITY ANGIOGRAM;  Surgeon: Burnell Blanks, MD;  Location: [facility] C[facility] LAB;  Service: Cardiovascular;  Laterality: N/A;   LOWER EXTREMITY ANGIOGRAPHY N/A 06/20/2021   Procedure: LOWER EXTREMITY ANGIOGRAPHY;  Surgeon: Nigel Mormon, MD;  Location: [place] [facility];  Service: Cardiovascular;  Laterality: N/A;   NECK SURGERY     PARTIAL COLECTOMY N/A 01/13/2018   Procedure: LAPAROSCOPIC ASSISTED   SIGMOID COLECTOMY ILEOSTOMY;  Surgeon: Clovis Riley,  MD;  Location: [facility];  Service: General;  Laterality: N/A;   PENILE PROSTHESIS IMPLANT  08/14/05   INFRAPUBIC INSERTION OF INFLATABLE PENILE PROSTHESIS; SURGEON DR. Amalia Hailey   PENILE PROSTHESIS IMPLANT     PERCUTANEOUS CORONARY STENT INTERVENTION (PCI-S) Right 10/29/2014   Procedure: PERCUTANEOUS CORONARY STENT INTERVENTION (PCI-S);  Surgeon: Laverda Page, MD;  Location: [facility] CATH  LAB;  Service: Cardiovascular;  Laterality: Right;   PERIPHERAL VASCULAR INTERVENTION Left 06/23/2021   Procedure: PERIPHERAL VASCULAR INTERVENTION;  Surgeon: Nigel Mormon, MD;  Location: [place] [facility];  Service: Cardiovascular;  Laterality: Left;   REMOVAL OF PENILE PROSTHESIS N/A 06/14/2021   Procedure: Removal of THREE piece inflatable penile prosthesis;  Surgeon: Lucas Mallow, MD;  Location: [place];  Service: Urology;  Laterality: N/A;   SHOULDER ARTHROSCOPY     SPINE SURGERY     TOE AMPUTATION Left    TONSILLECTOMY     TOTAL KNEE ARTHROPLASTY  07/2002   RIGHT KNEE ; SURGEON  DR. [name] ALSO HAD ARTHROSCOPIC RIGHT KNEE IN  10/2001   TOTAL KNEE ARTHROPLASTY     ULNAR NERVE TRANSPOSITION Right 10/21/2013   Procedure: RIGHT ELBOW  ULNAR NERVE DECOMPRESSION;  Surgeon: Wynonia Sours, MD;  Location: [place];  Service: Orthopedics;  Laterality: Right;     Scheduled Meds:  amiodarone  100 mg Oral Daily   apixaban  5 mg Oral BID   atorvastatin  40 mg Oral QHS   Chlorhexidine Gluconate Cloth  6 each Topical Q0600   [START ON 06/26/2022] doxercalciferol  2 mcg Intravenous Q T,Th,Sa-HD   famotidine  20 mg Oral Daily   fentaNYL  1 patch Transdermal Y69S   folic acid  1 mg Oral Daily   [name]'s butt cream   Topical TID   insulin aspart  0-9 Units Subcutaneous TID WC   insulin glargine-yfgn  15 Units Subcutaneous QHS   loratadine  10 mg Oral Daily   metoCLOPramide  5 mg Oral TID AC   midodrine  10 mg Oral QODAY   nitroGLYCERIN  0.2 mg Transdermal Daily   pantoprazole  40 mg Oral  Daily   pregabalin  100 mg Oral BID   rOPINIRole  2 mg Oral QHS   tamsulosin  0.4 mg Oral Daily   Continuous Infusions:  pencillin G potassium IV 1 Million Units (06/25/22 0525)   PRN Meds:.acetaminophen **OR** acetaminophen, linaclotide, ondansetron **OR** ondansetron (ZOFRAN) IV, mouth rinse, oxyCODONE-acetaminophen, perflutren lipid microspheres (DEFINITY) IV suspension, polyvinyl alcohol  Allergies  Allergen Reactions   Contrast Media [Iodinated Contrast Media] Shortness Of Breath and Other (See Comments)    Difficulty breathing and altered mental status     Ivp Dye [Iodinated Contrast Media] Anaphylaxis, Shortness Of Breath and Other (See Comments)    Breathing problems, altered mental state    Adhesive [Tape] Rash and Other (See Comments)    Rash after 1 day of use   Latex Rash and Other (See Comments)    A severe rash appears after the first 24 hours of being placed   Social History   Socioeconomic History   Marital status: Married    Spouse name: Not on file   Number of children: 3   Years of education: Not on file   Highest education level: Not on file  Occupational History   Occupation: TRUCK DRIVER    Employer: UNEMPLOYED  Tobacco Use   Smoking status: Former    Packs/day: 2.00    Years: 35.00    Total pack years: 70.00    Types: Cigarettes    Quit date: 10/28/2011    Years since quitting: 10.6   Smokeless tobacco: Never  Vaping Use   Vaping Use: Some days   Substances: Nicotine  Substance and Sexual Activity   Alcohol use: Not Currently    Comment: "not in a long time"  Drug use: Never   Sexual activity: Yes    Birth control/protection: Implant    Comment: penile implant  Other Topics Concern   Not on file  Social History Narrative   ** Merged History Encounter **       HEAVY SMOKER AND CONTINUES TO SMOKE 1 PPD. DOES NOT EXERCISE REGULARLY.  Wife [phone] Lebron Quam). Has 2 sons and daughter. Still active       Social Determinants of  Health   Financial Resource Strain: Not on file  Food Insecurity: No Food Insecurity (06/24/2022)   Hunger Vital Sign    Worried About Running Out of Food in the Last Year: Never true    Ran Out of Food in the Last Year: Never true  Transportation Needs: No Transportation Needs (06/24/2022)   PRAPARE - Hydrologist (Medical): No    Lack of Transportation (Non-Medical): No  Physical Activity: Not on file  Stress: Not on file  Social Connections: Not on file  Intimate Partner Violence: Not At Risk (06/24/2022)   Humiliation, Afraid, Rape, and Kick questionnaire    Fear of Current or Ex-Partner: No    Emotionally Abused: No    Physically Abused: No    Sexually Abused: No   Family History  Problem Relation Age of Onset   Heart disease Father        Before age 64-  CAD, BPG   Diabetes Father        Amputation   Cancer Father        PROSTATE   Hyperlipidemia Father    Hypertension Father    Heart attack Father        Triple BPG   Varicose Veins Father    Cancer Sister        Breast   Hyperlipidemia Sister    Hypertension Sister    Heart attack Brother    Colon cancer Brother    Diabetes Brother    Heart disease Brother 10       A-Fib. Before age 62   Hyperlipidemia Brother    Hypertension Brother    Hypertension Son    Arthritis Other        GRANDMOTHER   Hypertension Other        OTHER FAMILY MEMBERS     Vitals BP (!) 131/59   Pulse 74   Temp 98.7 F (37.1 C) (Oral)   Resp 18   Ht '6\' 2"'$  (1.88 m)   Wt 125.7 kg   SpO2 99%   BMI 35.58 kg/m    Physical Exam Constitutional: Morbidly obese male sitting up in the recliner, appears comfortable    Comments:   Cardiovascular:     Rate and Rhythm: Normal rate and regular rhythm.     Heart sounds:  Pulmonary:     Effort: Pulmonary effort is normal on the room air    Comments: Normal breath sounds  Abdominal:     Palpations: Abdomen is soft.     Tenderness: Nontender and  nondistended  Musculoskeletal:        General: Right BKA, stump healed.                     Left leg is bandaged, no strikethrough on bandage, C/D/I                   Chronic Ulcer on the lateral aspect of the left foot with possible cellulitis, unlikely nec fasc  Skin:    Comments: Left AV fistula appears okay  Neurological:     General: Awake/alert and oriented, grossly nonfocal  Psychiatric:        Mood and Affect: Mood normal.    Pertinent Microbiology Results for orders placed or performed during the hospital encounter of 06/23/22  Culture, blood (Routine x 2)     Status: None (Preliminary result)   Collection Time: 06/23/22  2:04 PM   Specimen: BLOOD  Result Value Ref Range Status   Specimen Description BLOOD RIGHT ANTECUBITAL  Final   Special Requests   Final    BOTTLES DRAWN AEROBIC ONLY Blood Culture adequate volume   Culture   Final    NO GROWTH 2 DAYS Performed at [facility], [place] [address], [place], [place] [address]    Report Status PENDING  Incomplete  Culture, blood (Routine x 2)     Status: Abnormal (Preliminary result)   Collection Time: 06/23/22  4:23 PM   Specimen: BLOOD RIGHT ARM  Result Value Ref Range Status   Specimen Description BLOOD RIGHT ARM  Final   Special Requests   Final    BOTTLES DRAWN AEROBIC AND ANAEROBIC Blood Culture adequate volume   Culture  Setup Time   Final    GRAM POSITIVE COCCI IN CHAINS AEROBIC BOTTLE ONLY CRITICAL RESULT CALLED TO, READ BACK BY AND VERIFIED WITH: Obetz [id number] FCP    Culture (A)  Final    GROUP B STREP(S.AGALACTIAE)ISOLATED SUSCEPTIBILITIES TO FOLLOW Performed at [facility], [place] [address] [place], [place] [address]    Report Status PENDING  Incomplete  Resp Panel by RT-PCR (Flu A&B, Covid) Anterior Nasal Swab     Status: None   Collection Time: 06/23/22  4:23 PM   Specimen: Anterior Nasal Swab  Result Value Ref Range Status   SARS Coronavirus 2 by RT PCR NEGATIVE  NEGATIVE Final    Comment: (NOTE) SARS-CoV-2 target nucleic acids are NOT DETECTED.  The SARS-CoV-2 RNA is generally detectable in upper respiratory specimens during the acute phase of infection. The lowest concentration of SARS-CoV-2 viral copies this assay can detect is 138 copies/mL. A negative result does not preclude SARS-Cov-2 infection and should not be used as the sole basis for treatment or other patient management decisions. A negative result may occur with  improper specimen collection/handling, submission of specimen other than nasopharyngeal swab, presence of viral mutation(s) within the areas targeted by this assay, and inadequate number of viral copies(<138 copies/mL). A negative result must be combined with clinical observations, patient history, and epidemiological information. The expected result is Negative.  Fact Sheet for Patients:  EntrepreneurPulse.com.au  Fact Sheet for Healthcare Providers:  IncredibleEmployment.be  This test is no t yet approved or cleared by the [place] FDA and  has been authorized for detection and/or diagnosis of SARS-CoV-2 by FDA under an Emergency Use Authorization (EUA). This EUA will remain  in effect (meaning this test can be used) for the duration of the COVID-19 declaration under Section 564(b)(1) of the Act, 21 U.S.C.section 360bbb-3(b)(1), unless the authorization is terminated  or revoked sooner.       Influenza A by PCR NEGATIVE NEGATIVE Final   Influenza B by PCR NEGATIVE NEGATIVE Final    Comment: (NOTE) The Xpert Xpress SARS-CoV-2/FLU/RSV plus assay is intended as an aid in the diagnosis of influenza from Nasopharyngeal swab specimens and should not be used as a sole basis for treatment. Nasal washings and aspirates  are unacceptable for Xpert Xpress SARS-CoV-2/FLU/RSV testing.  Fact Sheet for Patients: EntrepreneurPulse.com.au  Fact Sheet for Healthcare  Providers: IncredibleEmployment.be  This test is not yet approved or cleared by the [place] FDA and has been authorized for detection and/or diagnosis of SARS-CoV-2 by FDA under an Emergency Use Authorization (EUA). This EUA will remain in effect (meaning this test can be used) for the duration of the COVID-19 declaration under Section 564(b)(1) of the Act, 21 U.S.C. section 360bbb-3(b)(1), unless the authorization is terminated or revoked.  Performed at [facility], [place] [address] [place], [place] [address]   Blood Culture ID Panel (Reflexed)     Status: Abnormal   Collection Time: 06/23/22  4:23 PM  Result Value Ref Range Status   Enterococcus faecalis NOT DETECTED NOT DETECTED Final   Enterococcus Faecium NOT DETECTED NOT DETECTED Final   Listeria monocytogenes NOT DETECTED NOT DETECTED Final   Staphylococcus species NOT DETECTED NOT DETECTED Final   Staphylococcus aureus (BCID) NOT DETECTED NOT DETECTED Final   Staphylococcus epidermidis NOT DETECTED NOT DETECTED Final   Staphylococcus lugdunensis NOT DETECTED NOT DETECTED Final   Streptococcus species DETECTED (A) NOT DETECTED Final    Comment: CRITICAL RESULT CALLED TO, READ BACK BY AND VERIFIED WITH: Benson [id number] FCP    Streptococcus agalactiae DETECTED (A) NOT DETECTED Final    Comment: CRITICAL RESULT CALLED TO, READ BACK BY AND VERIFIED WITH: Sharon [id number] FCP    Streptococcus pneumoniae NOT DETECTED NOT DETECTED Final   Streptococcus pyogenes NOT DETECTED NOT DETECTED Final   A.calcoaceticus-baumannii NOT DETECTED NOT DETECTED Final   Bacteroides fragilis NOT DETECTED NOT DETECTED Final   Enterobacterales NOT DETECTED NOT DETECTED Final   Enterobacter cloacae complex NOT DETECTED NOT DETECTED Final   Escherichia coli NOT DETECTED NOT DETECTED Final   Klebsiella aerogenes NOT DETECTED NOT DETECTED Final   Klebsiella oxytoca NOT DETECTED NOT DETECTED Final    Klebsiella pneumoniae NOT DETECTED NOT DETECTED Final   Proteus species NOT DETECTED NOT DETECTED Final   Salmonella species NOT DETECTED NOT DETECTED Final   Serratia marcescens NOT DETECTED NOT DETECTED Final   Haemophilus influenzae NOT DETECTED NOT DETECTED Final   Neisseria meningitidis NOT DETECTED NOT DETECTED Final   Pseudomonas aeruginosa NOT DETECTED NOT DETECTED Final   Stenotrophomonas maltophilia NOT DETECTED NOT DETECTED Final   Candida albicans NOT DETECTED NOT DETECTED Final   Candida auris NOT DETECTED NOT DETECTED Final   Candida glabrata NOT DETECTED NOT DETECTED Final   Candida krusei NOT DETECTED NOT DETECTED Final   Candida parapsilosis NOT DETECTED NOT DETECTED Final   Candida tropicalis NOT DETECTED NOT DETECTED Final   Cryptococcus neoformans/gattii NOT DETECTED NOT DETECTED Final    Comment: Performed at [facility], [place]. [address] [place], [place] [address]    Pertinent Lab seen by me:    Latest Ref Rng & Units 06/25/2022    1:57 AM 06/24/2022    1:03 AM 06/23/2022    4:46 PM  CBC  WBC 4.0 - 10.5 K/uL 9.3  13.4    Hemoglobin 13.0 - 17.0 g/dL 9.0  9.5  10.2   Hematocrit 39.0 - 52.0 % 26.3  28.7  30.0   Platelets 150 - 400 K/uL 133  134        Latest Ref Rng & Units 06/25/2022    1:57 AM 06/24/2022    1:03 AM 06/23/2022    4:46 PM  CMP  Glucose 70 - 99 mg/dL 223  196    BUN 8 - 23 mg/dL 49  35    Creatinine 0.61 - 1.24 mg/dL 7.28  6.16    Sodium 135 - 145 mmol/L 135  139  137   Potassium 3.5 - 5.1 mmol/L 3.6  4.2  4.4   Chloride 98 - 111 mmol/L 101  103    CO2 22 - 32 mmol/L 22  21    Calcium 8.9 - 10.3 mg/dL 7.7  7.8    Total Protein 6.5 - 8.1 g/dL  5.0    Total Bilirubin 0.3 - 1.2 mg/dL  0.9    Alkaline Phos 38 - 126 U/L  94    AST 15 - 41 U/L  23    ALT 0 - 44 U/L  14       Pertinent Imagings/Other Imagings Plain films and CT images have been personally visualized and interpreted; radiology reports have been reviewed.  Decision making incorporated into the Impression / Recommendations.  CT CHEST ABDOMEN PELVIS WO CONTRAST  Result Date: 06/23/2022 CLINICAL DATA:  Left foot osteomyelitis. Clinical concern for sepsis. Diabetes. EXAM: CT CHEST, ABDOMEN AND PELVIS WITHOUT CONTRAST TECHNIQUE: Multidetector CT imaging of the chest, abdomen and pelvis was performed following the standard protocol without IV contrast. RADIATION DOSE REDUCTION: This exam was performed according to the departmental dose-optimization program which includes automated exposure control, adjustment of the mA and/or kV according to patient size and/or use of iterative reconstruction technique. COMPARISON:  None Available. FINDINGS: CT CHEST FINDINGS Cardiovascular: Enlarged heart. Atheromatous calcifications, including the coronary arteries and aorta. Mediastinum/Nodes: No enlarged mediastinal, hilar, or axillary lymph nodes. Thyroid gland, trachea, and esophagus demonstrate no significant findings. Lungs/Pleura: Mild bibasilar dependent atelectasis. 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 Lower cervical spine fixation hardware. CT ABDOMEN PELVIS FINDINGS Hepatobiliary: No focal liver abnormality is seen. Status post cholecystectomy. No biliary dilatation. Pancreas: Mild pancreatic atrophy. Spleen: Normal in size without focal abnormality. Adrenals/Urinary Tract: Normal appearing adrenal glands. Stable small exophytic right renal cyst. Unremarkable left kidney, ureters and urinary bladder. Stomach/Bowel: Descending and sigmoid colon diverticula. Mid sigmoid colon anastomosis. Anastomosis involving a dilated distal small bowel loop containing fecalized material. Small appendicoliths in the proximal appendix without evidence of appendicitis. Unremarkable stomach. Vascular/Lymphatic: Atheromatous arterial calcifications without aneurysm. Multiple mildly enlarged left inguinal lymph nodes with central low density. The largest  has a short axis diameter of 13 mm on image number 129/3, previously 9 mm. Reproductive: Prostate is unremarkable. Other: Small bilateral inguinal hernias containing fat. 3 supraumbilical ventral hernias containing fat. Midline infraumbilical scar and right lower quadrant anterior scar. Musculoskeletal: Interbody and pedicle screw and rod fusion at the L4 through S1 levels. Lumbar spine degenerative changes. IMPRESSION: 1. Multiple mildly enlarged left inguinal lymph nodes with central low density. This is nonspecific with differential considerations including adenitis, reactive adenopathy and metastatic adenopathy. 2. No acute abnormality in the abdomen or pelvis. 3. Cardiomegaly. 4.  Calcific coronary artery and aortic atherosclerosis. 5. Colonic diverticulosis. 6. Multiple ventral hernias containing fat. Aortic Atherosclerosis (ICD10-I70.0). Electronically Signed   By: Claudie Revering M.D.   On: 06/23/2022 17:51   CT Foot Left Wo Contrast  Result Date: 06/23/2022 CLINICAL DATA:  Foot ulcer, concern for osteomyelitis EXAM: CT OF THE LEFT FOOT WITHOUT CONTRAST TECHNIQUE: Multidetector CT imaging of the left foot was performed according to the standard protocol. Multiplanar CT image reconstructions were also generated. RADIATION DOSE REDUCTION: This exam was performed according  to the departmental dose-optimization program which includes automated exposure control, adjustment of the mA and/or kV according to patient size and/or use of iterative reconstruction technique. COMPARISON:  X-ray 06/23/2022, MRI 01/12/2022 FINDINGS: Bones/Joint/Cartilage Bones are diffusely demineralized. 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 Cortical thickening and sclerosis of the third and fourth metatarsal bases and to a lesser degree within the cuboid compatible with sequela of chronic osteomyelitis. No evidence of a new erosion or aggressive periosteal reaction. No fracture or dislocation. Similar degenerative changes  of the foot. No tibiotalar or subtalar joint effusions. Ligaments Suboptimally assessed by CT. Muscles and Tendons Denervation and amputation changes of the musculotendinous structures of the foot. No acute findings. No appreciable tenosynovial fluid collection. Soft tissues Shallow soft tissue ulceration at the lateral aspect of the hindfoot in the region of the calcaneocuboid joint. Soft tissue edema at the lateral and dorsal aspects of the foot. No organized fluid collection. No soft tissue gas. IMPRESSION: 1. Shallow soft tissue ulceration at the lateral aspect of the hindfoot in the region of the calcaneocuboid joint with adjacent cellulitis. 2. Findings of chronic osteomyelitis involving the third and fourth metatarsal bases and adjacent cuboid. No evidence of a new erosion or aggressive periosteal reaction to suggest acute osteomyelitis by CT. Electronically Signed   By: Davina Poke D.O.   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 No recent fracture or dislocation is seen. Osteopenia is seen in bony structures. No focal lytic lesions are seen. Plantar spur is seen in calcaneus. Degenerative changes are noted in the intertarsal and tarsometatarsal joints. Arterial calcifications are seen in soft tissues. Overall, no significant interval changes are noted. IMPRESSION: No fracture or dislocation is seen. Postsurgical changes as described in the body of the report. There are no focal lytic lesions. If there is clinical suspicion for osteomyelitis, follow-up MRI may be considered. Electronically Signed   By: Elmer Picker M.D.   On: 06/23/2022 15:47   DG Chest 2 View  Result Date: 06/23/2022 CLINICAL DATA:  Suspected sepsis EXAM: CHEST - 2 VIEW COMPARISON:  01/11/2022 FINDINGS: The heart size and  mediastinal contours are within normal limits. Both lungs are clear. The visualized skeletal structures are unremarkable. IMPRESSION: No active cardiopulmonary disease. Electronically Signed   By: Nelson Chimes M.D.   On: 06/23/2022 15:44    I spent 110 minutes for this patient encounter including review of prior medical records/discussing diagnostics and treatment plan with the patient/family/coordinate care with primary/other specialits with greater than 50% of time in face to face encounter.   Electronically signed by:   Rosiland Oz, MD Infectious Disease Physician [facility] Pager: [phone]

## 2022-06-25 NOTE — Progress Notes (Signed)
Received patient in bed to unit.  Alert and oriented.  Informed consent signed and in chart.   Treatment initiated: 3p Treatment completed: [id number]  Patient tolerated well.  Transported back to the room  Alert, without acute distress.  Hand-off given to patient's nurse.   Access used: Yes Access issues: No  Total UF removed: 3000 Medication(s) given: 0 Post HD VS: 98.3, 157/61, 98 18, 100 RA Post HD weight: 128.5 kg   [facility]

## 2022-06-25 NOTE — Consult Note (Signed)
ORTHOPAEDIC CONSULTATION  REQUESTING PHYSICIAN: Jonetta Osgood, MD  Chief Complaint: Altered mental status with sepsis.  HPI: [name] is a 66 y.o. male who presents with altered mental status with sepsis.  Patient states on Saturday he could not think or talk.  He was scheduled for dialysis.  Patient's wife called EMS.  Patient states his temperature was 103.  Past Medical History:  Diagnosis Date   Carotid artery occlusion 11/10/10   LEFT CAROTID ENDARTERECTOMY   Chronic kidney disease    Complication of anesthesia    BP WENT UP AT [facility] "   COPD (chronic obstructive pulmonary disease) ([place])    pt denies this dx as of 06/01/20 - no inhaler    Diabetes mellitus without complication ([place])    Diverticulitis    Diverticulosis of colon (without mention of hemorrhage)    DJD (degenerative joint disease)    knees/hands/feet/back/neck   Fatty liver    Full dentures    GERD (gastroesophageal reflux disease)    H/O hiatal hernia    History of blood transfusion    with a past surical procedure per patient 06/01/20   Hyperlipidemia    Hypertension    Neuromuscular disorder ([place])    peripheral neuropathy   Non-pressure chronic ulcer of other part of left foot limited to breakdown of skin ([place]) 11/12/2016   Osteomyelitis (H[facility])    left 5th metatarsal   PAD (peripheral artery disease) ([place])    Distal aortogram June 2012. Atherectomy left popliteal artery July 2012.    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   Unstable angina ([place]) 09/16/2018   Wears glasses    Past Surgical History:  Procedure Laterality Date   ABDOMINAL AORTOGRAM W/LOWER EXTREMITY N/A 06/23/2021   Procedure: ABDOMINAL AORTOGRAM W/LOWER EXTREMITY;  Surgeon: Nigel Mormon, MD;  Location: [place] [facility];  Service: Cardiovascular;  Laterality: N/A;   AMPUTATION  11/05/2011    Procedure: AMPUTATION RAY;  Surgeon: Wylene Simmer, MD;  Location: [place];  Service: Orthopedics;  Laterality: Right;  Amputation of Right 4&5th Toes   AMPUTATION Left 11/26/2012   Procedure: AMPUTATION RAY;  Surgeon: Wylene Simmer, MD;  Location: [place];  Service: Orthopedics;  Laterality: Left;  fourth ray amputation   AMPUTATION Right 08/27/2014   Procedure: Transmetatarsal Amputation;  Surgeon: Newt Minion, MD;  Location: [place];  Service: Orthopedics;  Laterality: Right;   AMPUTATION Right 01/14/2015   Procedure: AMPUTATION BELOW KNEE;  Surgeon: Newt Minion, MD;  Location: [place];  Service: Orthopedics;  Laterality: Right;   AMPUTATION Left 10/21/2015   Procedure: Left Foot 5th Ray Amputation;  Surgeon: Newt Minion, MD;  Location: [place];  Service: Orthopedics;  Laterality: Left;   ANTERIOR FUSION CERVICAL SPINE  02/06/06   C4-5, C5-6, C6-7; SURGEON DR. MAX COHEN   AV FISTULA PLACEMENT Left 06/02/2020   Procedure: ARTERIOVENOUS (AV) FISTULA CREATION LEFT;  Surgeon: Waynetta Sandy, MD;  Location: [place];  Service: Vascular;  Laterality: Left;   BACK SURGERY     x 3   [place] Left 07/21/2020   Procedure: LEFT UPPER ARM ATERIOVENOUS SUPERFISTULALIZATION;  Surgeon: Waynetta Sandy, MD;  Location: [place];  Service: Vascular;  Laterality: Left;   BELOW KNEE LEG AMPUTATION Right    CARDIAC CATHETERIZATION  10/31/04  2009   CAROTID ENDARTERECTOMY  11/10/10   CAROTID ENDARTERECTOMY Left 11/10/2010   Subtotal occlusion of left internal carotid artery with left hemispheric transient ischemic attacks.   CAROTID STENT     CARPAL TUNNEL RELEASE Right 10/21/2013   Procedure: RIGHT CARPAL TUNNEL RELEASE;  Surgeon: Wynonia Sours, MD;  Location: [place];  Service: Orthopedics;  Laterality: Right;   CHOLECYSTECTOMY     COLON SURGERY     COLONOSCOPY     COLOSTOMY REVERSAL  05/21/2018   ileostomy reversal   CYSTOSCOPY WITH STENT PLACEMENT Bilateral 01/13/2018    Procedure: CYSTOSCOPY WITH BILATERAL URETERAL CATHETER PLACEMENT;  Surgeon: Ardis Hughs, MD;  Location: [facility];  Service: Urology;  Laterality: Bilateral;   ESOPHAGEAL MANOMETRY Bilateral 07/19/2014   Procedure: ESOPHAGEAL MANOMETRY (EM);  Surgeon: Jerene Bears, MD;  [facility];  Service: Gastroenterology;  Laterality: Bilateral;   EYE SURGERY Bilateral 2020   cataract   FEMORAL ARTERY STENT     x6   FINGER SURGERY     FOOT SURGERY  04/25/2016    EXCISION BASE 5TH METATARSAL AND PARTIAL CUBOID LEFT FOOT   HERNIA REPAIR     LEFT INGUINAL AND UMBILICAL REPAIRS   HERNIA REPAIR     I & D EXTREMITY Left 04/25/2016   Procedure: EXCISION BASE 5TH METATARSAL AND PARTIAL CUBOID LEFT FOOT;  Surgeon: Newt Minion, MD;  Location: [place];  Service: Orthopedics;  Laterality: Left;   ILEOSTOMY  01/13/2018   Procedure: ILEOSTOMY;  Surgeon: Clovis Riley, MD;  Location: [facility];  Service: General;;   ILEOSTOMY CLOSURE N/A 05/21/2018   Procedure: ILEOSTOMY REVERSAL ERAS PATHWAY;  Surgeon: Clovis Riley, MD;  Location: [place];  Service: General;  Laterality: N/A;   IR RADIOLOGIST EVAL & MGMT  11/19/2017   IR RADIOLOGIST EVAL & MGMT  12/03/2017   IR RADIOLOGIST EVAL & MGMT  12/18/2017   JOINT REPLACEMENT Right 2001   Total knee   LAMINECTOMY     X 3 LUMBAR AND X 2 CERVICAL SPINE OPERATIONS   LAPAROSCOPIC CHOLECYSTECTOMY W/ CHOLANGIOGRAPHY  11/09/04   SURGEON DR. [place] CATH AND CORONARY ANGIOGRAPHY N/A 09/16/2018   Procedure: LEFT HEART CATH AND CORONARY ANGIOGRAPHY;  Surgeon: Nigel Mormon, MD;  Location: [place] [facility];  Service: Cardiovascular;  Laterality: N/A;   LEFT HEART CATHETERIZATION WITH CORONARY ANGIOGRAM N/A 10/29/2014   Procedure: LEFT HEART CATHETERIZATION WITH CORONARY ANGIOGRAM;  Surgeon: Laverda Page, MD;  Location: [facility];  Service: Cardiovascular;  Laterality: N/A;   LIGATION OF COMPETING BRANCHES OF ARTERIOVENOUS FISTULA Left  07/21/2020   Procedure: LIGATION OF COMPETING BRANCHES OF LEFT UPPER ARM ARTERIOVENOUS FISTULA;  Surgeon: Waynetta Sandy, MD;  Location: [place];  Service: Vascular;  Laterality: Left;   LOWER EXTREMITY ANGIOGRAM N/A 03/19/2012   Procedure: LOWER EXTREMITY ANGIOGRAM;  Surgeon: Burnell Blanks, MD;  Location: [facility] C[facility];  Service: Cardiovascular;  Laterality: N/A;   LOWER EXTREMITY ANGIOGRAPHY N/A 06/20/2021   Procedure: LOWER EXTREMITY ANGIOGRAPHY;  Surgeon: Nigel Mormon, MD;  Location: [place] [facility];  Service: Cardiovascular;  Laterality: N/A;   NECK SURGERY     PARTIAL COLECTOMY N/A 01/13/2018   Procedure: LAPAROSCOPIC ASSISTED   SIGMOID COLECTOMY ILEOSTOMY;  Surgeon: Clovis Riley, MD;  [facility];  Service: General;  Laterality: N/A;   PENILE PROSTHESIS IMPLANT  08/14/05   INFRAPUBIC INSERTION OF INFLATABLE PENILE PROSTHESIS; SURGEON  DR. Amalia Hailey   PENILE PROSTHESIS IMPLANT     PERCUTANEOUS CORONARY STENT INTERVENTION (PCI-S) Right 10/29/2014   Procedure: PERCUTANEOUS CORONARY STENT INTERVENTION (PCI-S);  Surgeon: Laverda Page, MD;  Location: [facility] CATH LAB;  Service: Cardiovascular;  Laterality: Right;   PERIPHERAL VASCULAR INTERVENTION Left 06/23/2021   Procedure: PERIPHERAL VASCULAR INTERVENTION;  Surgeon: Nigel Mormon, MD;  Location: [place] [facility];  Service: Cardiovascular;  Laterality: Left;   REMOVAL OF PENILE PROSTHESIS N/A 06/14/2021   Procedure: Removal of THREE piece inflatable penile prosthesis;  Surgeon: Lucas Mallow, MD;  Location: [place];  Service: Urology;  Laterality: N/A;   SHOULDER ARTHROSCOPY     SPINE SURGERY     TOE AMPUTATION Left    TONSILLECTOMY     TOTAL KNEE ARTHROPLASTY  07/2002   RIGHT KNEE ; SURGEON  DR. [name] ALSO HAD ARTHROSCOPIC RIGHT KNEE IN  10/2001   TOTAL KNEE ARTHROPLASTY     ULNAR NERVE TRANSPOSITION Right 10/21/2013   Procedure: RIGHT ELBOW  ULNAR NERVE DECOMPRESSION;  Surgeon: Wynonia Sours, MD;   Location: [place];  Service: Orthopedics;  Laterality: Right;   Social History   Socioeconomic History   Marital status: Married    Spouse name: Not on file   Number of children: 3   Years of education: Not on file   Highest education level: Not on file  Occupational History   Occupation: [employer]    Employer: UNEMPLOYED  Tobacco Use   Smoking status: Former    Packs/day: 2.00    Years: 35.00    Total pack years: 70.00    Types: Cigarettes    Quit date: 10/28/2011    Years since quitting: 10.6   Smokeless tobacco: Never  Vaping Use   Vaping Use: Some days   Substances: Nicotine  Substance and Sexual Activity   Alcohol use: Not Currently    Comment: "not in a long time"   Drug use: Never   Sexual activity: Yes    Birth control/protection: Implant    Comment: penile implant  Other Topics Concern   Not on file  Social History Narrative   ** Merged History Encounter **       HEAVY SMOKER AND CONTINUES TO SMOKE 1 PPD. DOES NOT EXERCISE REGULARLY.  Wife [phone] Lebron Quam). Has 2 sons and daughter. Still active       Social Determinants of Health   Financial Resource Strain: Not on file  Food Insecurity: No Food Insecurity (06/24/2022)   Hunger Vital Sign    Worried About Running Out of Food in the Last Year: Never true    Ran Out of Food in the Last Year: Never true  Transportation Needs: No Transportation Needs (06/24/2022)   PRAPARE - Hydrologist (Medical): No    Lack of Transportation (Non-Medical): No  Physical Activity: Not on file  Stress: Not on file  Social Connections: Not on file   Family History  Problem Relation Age of Onset   Heart disease Father        Before age 17-  CAD, BPG   Diabetes Father        Amputation   Cancer Father        PROSTATE   Hyperlipidemia Father    Hypertension Father    Heart attack Father        Triple BPG   Varicose Veins Father    Cancer Sister  Breast   Hyperlipidemia Sister    Hypertension Sister    Heart attack Brother    Colon cancer Brother    Diabetes Brother    Heart disease Brother 76       A-Fib. Before age 58   Hyperlipidemia Brother    Hypertension Brother    Hypertension Son    Arthritis Other        GRANDMOTHER   Hypertension Other        OTHER FAMILY MEMBERS   - negative except otherwise stated in the family history section Allergies  Allergen Reactions   Contrast Media [Iodinated Contrast Media] Shortness Of Breath and Other (See Comments)    Difficulty breathing and altered mental status     Ivp Dye [Iodinated Contrast Media] Anaphylaxis, Shortness Of Breath and Other (See Comments)    Breathing problems, altered mental state    Adhesive [Tape] Rash and Other (See Comments)    Rash after 1 day of use   Latex Rash and Other (See Comments)    A severe rash appears after the first 24 hours of being placed   Prior to Admission medications   Medication Sig Start Date End Date Taking? Authorizing Provider  amiodarone (PACERONE) 200 MG tablet Take 0.5 tablets (100 mg total) by mouth daily. 04/27/22  Yes Adrian Prows, MD  amLODipine (NORVASC) 5 MG tablet Take 5 mg by mouth daily as needed for dizziness. Take one tablet for BP > 160 mm Hg 01/30/21  Yes [provider]  apixaban (ELIQUIS) 5 MG TABS tablet Take 1 tablet (5 mg total) by mouth 2 (two) times daily. 01/14/22 06/23/22 Yes Mercy Riding, MD  atorvastatin (LIPITOR) 40 MG tablet Take 40 mg by mouth at bedtime.    Yes [provider]  B Complex-C-Zn-Folic Acid (DIALYVITE/ZINC) TABS Take 1 tablet by mouth daily. 10/04/21  Yes [provider]  doxycycline (VIBRA-TABS) 100 MG tablet TAKE 1 TABLET BY MOUTH TWICE A DAY Patient taking differently: Take 100 mg by mouth 2 (two) times daily. 04/02/22  Yes Newt Minion, MD  famotidine (PEPCID) 20 MG tablet Take 20 mg by mouth daily.   Yes [provider]  fentaNYL (DURAGESIC) 50  MCG/HR Place 1 patch onto the skin every 3 (three) days. 09/05/21  Yes Sharen Hones, MD  folic acid (FOLVITE) 1 MG tablet Take 1 mg by mouth daily. 09/18/21  Yes [provider]  Glucosamine HCl (GLUCOSAMINE PO) Take 1 tablet by mouth 2 (two) times daily.   Yes [provider]  insulin glargine (LANTUS) 100 UNIT/ML injection Inject 0.1 mLs (10 Units total) into the skin at bedtime. Patient taking differently: Inject 30 Units into the skin at bedtime. 06/30/21  Yes Antonieta Pert, MD  ketoconazole (NIZORAL) 2 % cream Apply 1 application topically 2 (two) times daily as needed for irritation.  03/07/18  Yes [provider]  lidocaine-prilocaine (EMLA) cream Apply 1 application topically See admin instructions. Prior to Dialysis days Tuesday,Thursday and saturday 06/06/21  Yes [provider]  linaclotide (LINZESS) 145 MCG CAPS capsule Take 145 mcg by mouth daily as needed (constipation).    Yes [provider]  loratadine (CLARITIN) 10 MG tablet Take 10 mg by mouth daily.   Yes [provider]  midodrine (PROAMATINE) 10 MG tablet Take 1 tablet (10 mg total) by mouth every other day. One hour before dialysis 09/07/21  Yes Barb Merino, MD  nitroGLYCERIN (NITRODUR - DOSED IN MG/24 HR) 0.2 mg/hr patch Place  1 patch (0.2 mg total) onto the skin daily. 05/21/22  Yes Newt Minion, MD  nitroGLYCERIN (NITROSTAT) 0.4 MG SL tablet Place 0.4 mg under the tongue every 5 (five) minutes as needed for chest pain. 01/06/22  Yes [provider]  NOVOLOG FLEXPEN 100 UNIT/ML FlexPen Inject 13 Units into the skin 3 (three) times daily. 06/05/22  Yes [provider]  omega-3 acid ethyl esters (LOVAZA) 1 g capsule Take 2 g by mouth 2 (two) times daily.   Yes [provider]  ondansetron (ZOFRAN-ODT) 4 MG disintegrating tablet Take 1 tablet by mouth 4 (four) times daily as needed for nausea/vomiting. 05/28/22  Yes [provider]   oxyCODONE-acetaminophen (PERCOCET) 7.5-325 MG tablet Take 1 tablet by mouth 3 (three) times daily as needed for moderate pain. Patient taking differently: Take 1 tablet by mouth every 8 (eight) hours. 09/05/21  Yes Sharen Hones, MD  pantoprazole (PROTONIX) 40 MG tablet Take 1 tablet (40 mg total) by mouth 2 (two) times daily for 30 days, THEN 1 tablet (40 mg total) daily. 01/14/22 06/23/22 Yes Mercy Riding, MD  Polyvinyl Alcohol-Povidone PF 1.4-0.6 % SOLN Place 1 drop into both eyes 2 (two) times daily as needed (dry eyes).    Yes [provider]  pregabalin (LYRICA) 100 MG capsule Take 100 mg by mouth 2 (two) times daily. 09/18/21  Yes [provider]  rOPINIRole (REQUIP) 2 MG tablet Take 2 mg by mouth at bedtime.   Yes [provider]  silver sulfADIAZINE (SILVADENE) 1 % cream Apply topically daily. 01/14/22  Yes Mercy Riding, MD  tamsulosin (FLOMAX) 0.4 MG CAPS capsule Take 0.4 mg by mouth daily.   Yes [provider]  HUMALOG KWIKPEN 100 UNIT/ML KwikPen Inject 4 Units into the skin 3 (three) times daily. Patient not taking: Reported on 06/23/2022 09/05/21   Sharen Hones, MD  metoCLOPramide (REGLAN) 5 MG tablet Take 1 tablet (5 mg total) by mouth 4 (four) times daily -  before meals and at bedtime. Patient taking differently: Take 5 mg by mouth 3 (three) times daily before meals. 01/14/22 07/13/22  Mercy Riding, MD  NEEDLE, REUSABLE, 22 G 22G X 1-1/2" MISC 1 Units by Does not apply route as directed. For B12 IM inj 02/15/18   Elgergawy, Silver Huguenin, MD  sevelamer carbonate (RENVELA) 800 MG tablet Take 800 mg by mouth 3 (three) times daily. Patient not taking: Reported on 06/23/2022 12/17/21   [provider]  Syringe/Needle, Disp, (SYRINGE 3CC/22GX1-1/2") 22G X 1-1/2" 3 ML MISC 1 Syringe by Does not apply route as directed. For b12 IM inj 02/15/18   Elgergawy, Silver Huguenin, MD   CT CHEST ABDOMEN PELVIS WO CONTRAST  Result Date: 06/23/2022 CLINICAL DATA:  Left foot  osteomyelitis. Clinical concern for sepsis. Diabetes. EXAM: CT CHEST, ABDOMEN AND PELVIS WITHOUT CONTRAST TECHNIQUE: Multidetector CT imaging of the chest, abdomen and pelvis was performed following the standard protocol without IV contrast. RADIATION DOSE REDUCTION: This exam was performed according to the departmental dose-optimization program which includes automated exposure control, adjustment of the mA and/or kV according to patient size and/or use of iterative reconstruction technique. COMPARISON:  None Available. FINDINGS: CT CHEST FINDINGS Cardiovascular: Enlarged heart. Atheromatous calcifications, including the coronary arteries and aorta. Mediastinum/Nodes: No enlarged mediastinal, hilar, or axillary lymph nodes. Thyroid gland, trachea, and esophagus demonstrate no significant findings. Lungs/Pleura: Mild bibasilar dependent atelectasis. 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 Lower cervical spine fixation hardware. CT ABDOMEN PELVIS  FINDINGS Hepatobiliary: No focal liver abnormality is seen. Status post cholecystectomy. No biliary dilatation. Pancreas: Mild pancreatic atrophy. Spleen: Normal in size without focal abnormality. Adrenals/Urinary Tract: Normal appearing adrenal glands. Stable small exophytic right renal cyst. Unremarkable left kidney, ureters and urinary bladder. Stomach/Bowel: Descending and sigmoid colon diverticula. Mid sigmoid colon anastomosis. Anastomosis involving a dilated distal small bowel loop containing fecalized material. Small appendicoliths in the proximal appendix without evidence of appendicitis. Unremarkable stomach. Vascular/Lymphatic: Atheromatous arterial calcifications without aneurysm. Multiple mildly enlarged left inguinal lymph nodes with central low density. The largest has a short axis diameter of 13 mm on image number 129/3, previously 9 mm. Reproductive: Prostate is unremarkable. Other: Small bilateral inguinal hernias  containing fat. 3 supraumbilical ventral hernias containing fat. Midline infraumbilical scar and right lower quadrant anterior scar. Musculoskeletal: Interbody and pedicle screw and rod fusion at the L4 through S1 levels. Lumbar spine degenerative changes. IMPRESSION: 1. Multiple mildly enlarged left inguinal lymph nodes with central low density. This is nonspecific with differential considerations including adenitis, reactive adenopathy and metastatic adenopathy. 2. No acute abnormality in the abdomen or pelvis. 3. Cardiomegaly. 4.  Calcific coronary artery and aortic atherosclerosis. 5. Colonic diverticulosis. 6. Multiple ventral hernias containing fat. Aortic Atherosclerosis (ICD10-I70.0). Electronically Signed   By: Claudie Revering M.D.   On: 06/23/2022 17:51   CT Foot Left Wo Contrast  Result Date: 06/23/2022 CLINICAL DATA:  Foot ulcer, concern for osteomyelitis EXAM: CT OF THE LEFT FOOT WITHOUT CONTRAST TECHNIQUE: Multidetector CT imaging of the left foot was performed according to the standard protocol. Multiplanar CT image reconstructions were also generated. RADIATION DOSE REDUCTION: This exam was performed according to the departmental dose-optimization program which includes automated exposure control, adjustment of the mA and/or kV according to patient size and/or use of iterative reconstruction technique. COMPARISON:  X-ray 06/23/2022, MRI 01/12/2022 FINDINGS: Bones/Joint/Cartilage Bones are diffusely demineralized. 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 Cortical thickening and sclerosis of the third and fourth metatarsal bases and to a lesser degree within the cuboid compatible with sequela of chronic osteomyelitis. No evidence of a new erosion or aggressive periosteal reaction. No fracture or dislocation. Similar degenerative changes of the foot. No tibiotalar or subtalar joint effusions. Ligaments Suboptimally assessed by CT. Muscles and Tendons Denervation and amputation changes of  the musculotendinous structures of the foot. No acute findings. No appreciable tenosynovial fluid collection. Soft tissues Shallow soft tissue ulceration at the lateral aspect of the hindfoot in the region of the calcaneocuboid joint. Soft tissue edema at the lateral and dorsal aspects of the foot. No organized fluid collection. No soft tissue gas. IMPRESSION: 1. Shallow soft tissue ulceration at the lateral aspect of the hindfoot in the region of the calcaneocuboid joint with adjacent cellulitis. 2. Findings of chronic osteomyelitis involving the third and fourth metatarsal bases and adjacent cuboid. No evidence of a new erosion or aggressive periosteal reaction to suggest acute osteomyelitis by CT. Electronically Signed   By: Davina Poke D.O.   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 No recent fracture or dislocation is seen. Osteopenia is seen in bony structures. No focal lytic lesions are seen. Plantar spur is seen in calcaneus. Degenerative changes are noted in the intertarsal and tarsometatarsal joints. Arterial calcifications are seen in soft tissues. Overall,  no significant interval changes are noted. IMPRESSION: No fracture or dislocation is seen. Postsurgical changes as described in the body of the report. There are no focal lytic lesions. If there is clinical suspicion for osteomyelitis, follow-up MRI may be considered. Electronically Signed   By: Elmer Picker M.D.   On: 06/23/2022 15:47   DG Chest 2 View  Result Date: 06/23/2022 CLINICAL DATA:  Suspected sepsis EXAM: CHEST - 2 VIEW COMPARISON:  01/11/2022 FINDINGS: The heart size and mediastinal contours are within normal limits. Both lungs are clear. The visualized skeletal structures are unremarkable. IMPRESSION: No active  cardiopulmonary disease. Electronically Signed   By: Nelson Chimes M.D.   On: 06/23/2022 15:44   - pertinent xrays, CT, MRI studies were reviewed and independently interpreted  Positive ROS: All other systems have been reviewed and were otherwise negative with the exception of those mentioned in the HPI and as above.  Physical Exam: General: Alert, no acute distress Psychiatric: Patient is competent for consent with normal mood and affect Lymphatic: No axillary or cervical lymphadenopathy Cardiovascular: No pedal edema Respiratory: No cyanosis, no use of accessory musculature GI: No organomegaly, abdomen is soft and non-tender    Images:  '@ENCIMAGES'$ @  Labs:  Lab Results  Component Value Date   HGBA1C 7.6 (H) 01/11/2022   HGBA1C 7.9 (H) 06/14/2021   HGBA1C 5.7 (H) 09/16/2018   ESRSEDRATE 26 (H) 06/23/2022   ESRSEDRATE 40 (H) 01/13/2022   ESRSEDRATE 55 (H) 01/11/2022   CRP 14.0 (H) 06/23/2022   CRP 3.6 (H) 01/13/2022   CRP 4.2 (H) 01/11/2022   REPTSTATUS PENDING 06/23/2022   GRAMSTAIN  06/14/2021    NO ORGANISMS SEEN SQUAMOUS EPITHELIAL CELLS PRESENT ABUNDANT WBC PRESENT,BOTH PMN AND MONONUCLEAR MODERATE GRAM POSITIVE COCCI    CULT GRAM POSITIVE COCCI 06/23/2022   LABORGA ENTEROCOCCUS FAECALIS 06/14/2021    Lab Results  Component Value Date   ALBUMIN 2.3 (L) 06/25/2022   ALBUMIN 2.4 (L) 06/24/2022   ALBUMIN 3.1 (L) 06/23/2022   PREALBUMIN 18 06/23/2022   PREALBUMIN 19.0 05/04/2016   PREALBUMIN 25.0 12/16/2015        Latest Ref Rng & Units 06/25/2022    1:57 AM 06/24/2022    1:03 AM 06/23/2022    4:46 PM  CBC EXTENDED  WBC 4.0 - 10.5 K/uL 9.3  13.4    RBC 4.22 - 5.81 MIL/uL 2.86  3.03    Hemoglobin 13.0 - 17.0 g/dL 9.0  9.5  10.2   HCT 39.0 - 52.0 % 26.3  28.7  30.0   Platelets 150 - 400 K/uL 133  134      Neurologic: Patient does not have protective sensation bilateral lower extremities.   MUSCULOSKELETAL:   Skin: Examination patient has a chronic  ulcer of the lateral base of the left foot.  He has a stable right transtibial amputation.  Patient is status post endovascular revascularization to the left lower extremity June 23, 2021.  Review of the CT scan shows chronic destructive bony changes of the cuboid and base of the fourth and fifth metatarsal with chronic osteomyelitis.  Assessment: Assessment: Acute sepsis with diabetic insensate neuropathy end-stage renal disease on dialysis status post revascularization of the left lower extremity with chronic osteomyelitis and a chronic ulcer.  Plan: Plan: Recommended proceeding with a transtibial amputation on the left.  Discussed the risks of recurrent sepsis.  Patient states he understands he will discuss this with his wife.  Anticipate surgical intervention on Wednesday.  Thank you for the  consult and the opportunity to see Mr. Joycie Peek, [place] [phone] 8:12 AM

## 2022-06-26 DIAGNOSIS — I5032 Chronic diastolic (congestive) heart failure: Secondary | ICD-10-CM | POA: Diagnosis not present

## 2022-06-26 DIAGNOSIS — L03116 Cellulitis of left lower limb: Secondary | ICD-10-CM | POA: Diagnosis not present

## 2022-06-26 DIAGNOSIS — A419 Sepsis, unspecified organism: Secondary | ICD-10-CM | POA: Diagnosis not present

## 2022-06-26 DIAGNOSIS — I482 Chronic atrial fibrillation, unspecified: Secondary | ICD-10-CM | POA: Diagnosis not present

## 2022-06-26 LAB — CBC
HCT: 28.6 % — ABNORMAL LOW (ref 39.0–52.0)
Hemoglobin: 9.8 g/dL — ABNORMAL LOW (ref 13.0–17.0)
MCH: 31.5 pg (ref 26.0–34.0)
MCHC: 34.3 g/dL (ref 30.0–36.0)
MCV: 92 fL (ref 80.0–100.0)
Platelets: 137 10*3/uL — ABNORMAL LOW (ref 150–400)
RBC: 3.11 MIL/uL — ABNORMAL LOW (ref 4.22–5.81)
RDW: 13.4 % (ref 11.5–15.5)
WBC: 6 10*3/uL (ref 4.0–10.5)
nRBC: 0 % (ref 0.0–0.2)

## 2022-06-26 LAB — CULTURE, BLOOD (ROUTINE X 2): Special Requests: ADEQUATE

## 2022-06-26 LAB — RENAL FUNCTION PANEL
Albumin: 2.4 g/dL — ABNORMAL LOW (ref 3.5–5.0)
Anion gap: 15 (ref 5–15)
BUN: 35 mg/dL — ABNORMAL HIGH (ref 8–23)
CO2: 23 mmol/L (ref 22–32)
Calcium: 7.6 mg/dL — ABNORMAL LOW (ref 8.9–10.3)
Chloride: 98 mmol/L (ref 98–111)
Creatinine, Ser: 5.93 mg/dL — ABNORMAL HIGH (ref 0.61–1.24)
GFR, Estimated: 10 mL/min — ABNORMAL LOW (ref >60)
Glucose, Bld: 238 mg/dL — ABNORMAL HIGH (ref 70–99)
Phosphorus: 3.2 mg/dL (ref 2.5–4.6)
Potassium: 3.5 mmol/L (ref 3.5–5.1)
Sodium: 136 mmol/L (ref 135–145)

## 2022-06-26 LAB — GLUCOSE, CAPILLARY
Glucose-Capillary: 320 mg/dL — ABNORMAL HIGH (ref 70–99)
Glucose-Capillary: 259 mg/dL — ABNORMAL HIGH (ref 70–99)
Glucose-Capillary: 320 mg/dL — ABNORMAL HIGH (ref 70–99)

## 2022-06-26 LAB — HEPATITIS B SURFACE ANTIBODY, QUANTITATIVE: Hep B S AB Quant (Post): 34.2 m[IU]/mL (ref >9.9)

## 2022-06-26 MED ORDER — PENTAFLUOROPROP-TETRAFLUOROETH EX AERO: 1.0000 | INHALATION_SPRAY | CUTANEOUS | Status: DC | PRN

## 2022-06-26 MED ORDER — INFLUENZA VAC A&B SA ADJ QUAD 0.5 ML IM PRSY
0.5000 mL | PREFILLED_SYRINGE | INTRAMUSCULAR | Status: AC
  Administered 2022-06-27: 0.5 mL via INTRAMUSCULAR
  Filled 2022-06-26: qty 0.5

## 2022-06-26 MED ORDER — PENTAFLUOROPROP-TETRAFLUOROETH EX AERO
INHALATION_SPRAY | CUTANEOUS | Status: AC
  Filled 2022-06-26: qty 30

## 2022-06-26 MED ORDER — LIDOCAINE-PRILOCAINE 2.5-2.5 % EX CREA: 1.0000 | TOPICAL_CREAM | CUTANEOUS | Status: DC | PRN

## 2022-06-26 MED ORDER — LIDOCAINE HCL (PF) 1 % IJ SOLN: 5.0000 mL | INTRAMUSCULAR | Status: DC | PRN

## 2022-06-26 MED ORDER — MIDODRINE HCL 5 MG PO TABS
10.0000 mg | ORAL_TABLET | ORAL | Status: DC
  Administered 2022-06-26 – 2022-06-29 (×3): 10 mg via ORAL
  Filled 2022-06-26 (×3): qty 2

## 2022-06-26 NOTE — Inpatient Diabetes Management (Signed)
Inpatient Diabetes Program Recommendations  AACE/ADA: New Consensus Statement on Inpatient Glycemic Control (2015)  Target Ranges:  Prepandial:   less than 140 mg/dL      Peak postprandial:   less than 180 mg/dL (1-2 hours)      Critically ill patients:  140 - 180 mg/dL    Latest Reference Range & Units 06/25/22 08:23 06/25/22 12:02 06/25/22 21:17  Glucose-Capillary 70 - 99 mg/dL 150 (H)  1 unit Novolog  126 (H)  1 unit Novolog  320 (H)    15 units Semglee  (H): Data is abnormally high  Latest Reference Range & Units 06/26/22 08:07  Glucose-Capillary 70 - 99 mg/dL 259 (H)  5 units Novolog   (H): Data is abnormally high    Home DM Meds: Lantus 30 units daily       Novolog 13 units TID with meals   Current Orders: Novolog Sensitive Correction Scale/ SSI (0-9 units) TID AC     Semglee 15 units QHS   MD- Note CBG 259 this AM  Please consider Increasing the Semglee to 20 units QHS    --Will follow patient during hospitalization--  Wyn Quaker RN, MSN, [place] Diabetes Coordinator Inpatient Glycemic Control Team Team Pager: [phone] (8a-5p)

## 2022-06-26 NOTE — Progress Notes (Signed)
Patient ID: [name], male   DOB: [birth year], 66 y.o.   MRN: [id number] Patient agrees to proceed with a left transtibial amputation.  His daughter is getting married this weekend and he would like to proceed with surgery on Wednesday October 25.  Plan for discharge to home this week on oral antibiotics.

## 2022-06-26 NOTE — Progress Notes (Signed)
PROGRESS NOTE        PATIENT DETAILS Name: [name] Age: 66 y.o. Sex: male Date of Birth: 03-28-1956 Admit Date: 06/23/2022 Admitting Physician Etta Quill, DO ZOX:[id number], Ovid Curd, PA-C  Brief Summary: Patient is a 66 y.o.  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  See below for further details.  Significant events: 10/14>> admit to [facility]-sepsis-suspected to be due to possible ongoing osteomyelitis of left foot.  Significant studies: 10/14>> CXR: No PNA 10/14>> CT chest/abdomen/pelvis: No acute abnormality, mildly enlarged left inguinal lymph nodes. 10/14>> CT left foot: Shallow soft tissue ulceration at the lateral aspect of the hindfoot with adjacent cellulitis.  Findings of chronic osteomyelitis involving the third/fourth metatarsal bases/adjacent cuboid.  No evidence of new erosion or aggressive periosteal reaction to suggest acute osteomyelitis.  Significant microbiology data: 10/14>> COVID PCR/influenza PCR: Negative 10/14>> blood culture: 1/2 group B strep 10/16>> blood culture: No growth  Procedures: None  Consults: Renal ID Orthopedics  Subjective: No major issues overnight.  Has decided to pursue  BKA, but wants to postpone till next week as his daughter is getting married this weekend.  Objective: Vitals: Blood pressure (!) 158/80, pulse (!) 115, temperature 99.5 F (37.5 C), temperature source Oral, resp. rate 18, height '6\' 2"'$  (1.88 m), weight 128.5 kg, SpO2 97 %.   Exam: Gen Exam:Alert awake-not in any distress HEENT:atraumatic, normocephalic Chest: B/L clear to auscultation anteriorly CVS:S1S2 regular Abdomen:soft non tender,  non distended Extremities: Right BKA-prosthesis in place. Neurology: Non focal Skin: no rash   Pertinent Labs/Radiology:    Latest Ref Rng & Units 06/25/2022    1:57 AM 06/24/2022    1:03 AM 06/23/2022    4:46 PM  CBC  WBC 4.0 - 10.5 K/uL 9.3  13.4    Hemoglobin 13.0 - 17.0 g/dL 9.0  9.5  10.2   Hematocrit 39.0 - 52.0 % 26.3  28.7  30.0   Platelets 150 - 400 K/uL 133  134      Lab Results  Component Value Date   NA 135 06/25/2022   K 3.6 06/25/2022   CL 101 06/25/2022   CO2 22 06/25/2022      Assessment/Plan: Severe sepsis due to group B strep bacteremia Sepsis physiology resolved Left foot likely the source of bacteremia Repeat blood culture on 10/16 negative so far Echo done on 10/16-but not yet read On IV Ancef Has agreed to proceed with left BKA but wants to postpone till next week as daughter getting married this weekend. Will await further recommendations from infectious disease  Acute metabolic encephalopathy Due to severe sepsis/bacteremia  Resolved-completely awake/alert  Left diabetic foot Chronic left lateral foot ulcer with underlying chronic osteomyelitis See above-Per prior discharge summary-refused BKA when offered.  Has completed 6 weeks of IV antibiotics per prior discharge summary. Has finally consented to left BKA but wants to postpone till next week-see above. [name] following.  PAD S/p left ray amputation, right BKA Has had multiple PCI's to left lower extremity in 2022. Continue statin  CAD-s/p PCI to RCA 2016 Denies any chest pain Continue  statin  PAF Maintaining sinus rhythm Continue amiodarone Continue Eliquis  ESRD on HD TTS Missed HD on 10/14 Volume status/electrolytes stable Nephrology following and directing HD care.  Chronic HFpEF Euvolemic Volume removal with HD  BPH Continue Flomax  DM-2 (A1c 7.6 on 5/4) CBG stable Continue Semglee 15 units daily/SSI Follow/optimize  Recent Labs    06/25/22 1202  06/25/22 2117 06/26/22 0807  GLUCAP 126* 320* 259*      Chronic pain syndrome Peripheral neuropathy RLS Continue Requip/Lyrica/fentanyl patch and as needed oxycodone. Watch for sedation.  Debility/deconditioning PT/OT eval-SNF recommended  Obesity: Estimated body mass index is 36.37 kg/m as calculated from the following:   Height as of this encounter: '6\' 2"'$  (1.88 m).   Weight as of this encounter: 128.5 kg.   Code status:   Code Status: Full Code   DVT Prophylaxis: apixaban (ELIQUIS) tablet 5 mg    Family Communication:  Spouse-[name] updated on 10/16   Disposition Plan: Status is: Inpatient Remains inpatient appropriate because: Sepsis physiology-unclear source with suspicion that this could be from left diabetic foot-possible bacteremia-awaiting cultures.  On empiric IV antibiotics.  Not stable for discharge.   Planned Discharge Destination:Home   Diet: Diet Order             Diet renal/carb modified with fluid restriction Diet-HS Snack? Nothing; Fluid restriction: 1200 mL Fluid; Room service appropriate? Yes; Fluid consistency: Thin  Diet effective now                     Antimicrobial agents: Anti-infectives (From admission, onward)    Start     Dose/Rate Route Frequency Ordered Stop   06/26/22 1200  vancomycin (VANCOCIN) IVPB 1000 mg/200 mL premix  Status:  Discontinued        1,000 mg 200 mL/hr over 60 Minutes Intravenous Every T-Th-Sa (Hemodialysis) 06/23/22 2149 06/24/22 1959   06/26/22 1200  ceFAZolin (ANCEF) IVPB 2g/100 mL premix        2 g 200 mL/hr over 30 Minutes Intravenous Every T-Th-Sa (Hemodialysis) 06/25/22 1559     06/25/22 1800  ceFAZolin (ANCEF) IVPB 2g/100 mL premix        2 g 200 mL/hr over 30 Minutes Intravenous  Once 06/25/22 1559 06/25/22 2328   06/24/22 2200  penicillin G potassium 1 Million Units in dextrose 5 % 50 mL IVPB  Status:  Discontinued        1 Million Units 100 mL/hr over 30 Minutes  Intravenous Every 6 hours 06/24/22 1959 06/25/22 1559   06/24/22 1600  cefTRIAXone (ROCEPHIN) 2 g in sodium chloride 0.9 % 100 mL IVPB  Status:  Discontinued        2 g 200 mL/hr over 30 Minutes Intravenous Every 24 hours 06/23/22 2006 06/24/22 1959   06/23/22 2200  metroNIDAZOLE (FLAGYL) tablet 500 mg  Status:  Discontinued        500 mg Oral Every 12 hours 06/23/22 2006 06/24/22 1959   06/23/22 2030  vancomycin (VANCOCIN) 2,250 mg in sodium chloride 0.9 % 500 mL IVPB        2,250 mg 261.3 mL/hr over 120 Minutes Intravenous  Once 06/23/22 2029 06/24/22 0052   06/23/22 1615  cefTRIAXone (ROCEPHIN) 1 g in sodium chloride 0.9 % 100 mL IVPB        1 g 200 mL/hr over 30 Minutes Intravenous  Once 06/23/22 1606 06/23/22 1700        MEDICATIONS: Scheduled Meds:  amiodarone  100 mg Oral  Daily   apixaban  5 mg Oral BID   atorvastatin  40 mg Oral QHS   Chlorhexidine Gluconate Cloth  6 each Topical Q0600   famotidine  20 mg Oral Daily   fentaNYL  1 patch Transdermal U31S   folic acid  1 mg Oral Daily   [name]'s butt cream   Topical TID   [START ON 06/27/2022] influenza vaccine adjuvanted  0.5 mL Intramuscular Tomorrow-1000   insulin aspart  0-9 Units Subcutaneous TID WC   insulin glargine-yfgn  15 Units Subcutaneous QHS   loratadine  10 mg Oral Daily   metoCLOPramide  5 mg Oral TID AC   midodrine  10 mg Oral Q T,Th,Sa-HD   nitroGLYCERIN  0.2 mg Transdermal Daily   nutrition supplement (JUVEN)  1 packet Oral BID BM   pantoprazole  40 mg Oral Daily   pregabalin  100 mg Oral BID   rOPINIRole  2 mg Oral QHS   tamsulosin  0.4 mg Oral Daily   Continuous Infusions:   ceFAZolin (ANCEF) IV     PRN Meds:.acetaminophen **OR** acetaminophen, linaclotide, ondansetron **OR** ondansetron (ZOFRAN) IV, mouth rinse, oxyCODONE-acetaminophen, polyvinyl alcohol   I have personally reviewed following labs and imaging studies  LABORATORY DATA: CBC: Recent Labs  Lab 06/23/22 1404 06/23/22 1646  06/24/22 0103 06/25/22 0157  WBC 13.5*  --  13.4* 9.3  NEUTROABS 12.2*  --   --   --   HGB 11.3* 10.2* 9.5* 9.0*  HCT 33.0* 30.0* 28.7* 26.3*  MCV 93.0  --  94.7 92.0  PLT 141*  --  134* 133*     Basic Metabolic Panel: Recent Labs  Lab 06/23/22 1404 06/23/22 1646 06/24/22 0103 06/24/22 1349 06/25/22 0157  NA 139 137 139  --  135  K 4.1 4.4 4.2  --  3.6  CL 100  --  103  --  101  CO2 25  --  21*  --  22  GLUCOSE 352*  --  196*  --  223*  BUN 34*  --  35*  --  49*  CREATININE 6.07*  --  6.16*  --  7.28*  CALCIUM 8.1*  --  7.8*  --  7.7*  PHOS  --   --   --  4.7* 5.3*     GFR: Estimated Creatinine Clearance: 13.8 mL/min (A) (by C-G formula based on SCr of 7.28 mg/dL (H)).  Liver Function Tests: Recent Labs  Lab 06/23/22 1404 06/24/22 0103 06/25/22 0157  AST 20 23  --   ALT 16 14  --   ALKPHOS 133* 94  --   BILITOT 0.9 0.9  --   PROT 6.1* 5.0*  --   ALBUMIN 3.1* 2.4* 2.3*    No results for input(s): "LIPASE", "AMYLASE" in the last 168 hours. No results for input(s): "AMMONIA" in the last 168 hours.  Coagulation Profile: Recent Labs  Lab 06/23/22 1404 06/24/22 0103  INR 1.3* 1.5*     Cardiac Enzymes: No results for input(s): "CKTOTAL", "CKMB", "CKMBINDEX", "TROPONINI" in the last 168 hours.  BNP (last 3 results) No results for input(s): "PROBNP" in the last 8760 hours.  Lipid Profile: No results for input(s): "CHOL", "HDL", "LDLCALC", "TRIG", "CHOLHDL", "LDLDIRECT" in the last 72 hours.  Thyroid Function Tests: No results for input(s): "TSH", "T4TOTAL", "FREET4", "T3FREE", "THYROIDAB" in the last 72 hours.  Anemia Panel: No results for input(s): "VITAMINB12", "FOLATE", "FERRITIN", "TIBC", "IRON", "RETICCTPCT" in the last 72 hours.  Urine analysis:  Component Value Date/Time   COLORURINE YELLOW 06/23/2022 2015   APPEARANCEUR CLEAR 06/23/2022 2015   LABSPEC 1.013 06/23/2022 2015   PHURINE 8.0 06/23/2022 2015   GLUCOSEU >=500 (A) 06/23/2022  2015   HGBUR SMALL (A) 06/23/2022 2015   BILIRUBINUR NEGATIVE 06/23/2022 2015   KETONESUR NEGATIVE 06/23/2022 2015   PROTEINUR 100 (A) 06/23/2022 2015   UROBILINOGEN 0.2 01/14/2015 0346   NITRITE NEGATIVE 06/23/2022 2015   LEUKOCYTESUR NEGATIVE 06/23/2022 2015    Sepsis Labs: Lactic Acid, Venous    Component Value Date/Time   LATICACIDVEN 3.7 ([place]) 06/24/2022 0103    MICROBIOLOGY: Recent Results (from the past 240 hour(s))  Culture, blood (Routine x 2)     Status: None (Preliminary result)   Collection Time: 06/23/22  2:04 PM   Specimen: BLOOD  Result Value Ref Range Status   Specimen Description BLOOD RIGHT ANTECUBITAL  Final   Special Requests   Final    BOTTLES DRAWN AEROBIC ONLY Blood Culture adequate volume   Culture   Final    NO GROWTH 3 DAYS Performed at [facility], [address] [place],  [address]    Report Status PENDING  Incomplete  Culture, blood (Routine x 2)     Status: Abnormal   Collection Time: 06/23/22  4:23 PM   Specimen: BLOOD RIGHT ARM  Result Value Ref Range Status   Specimen Description BLOOD RIGHT ARM  Final   Special Requests   Final    BOTTLES DRAWN AEROBIC AND ANAEROBIC Blood Culture adequate volume   Culture  Setup Time   Final    GRAM POSITIVE COCCI IN CHAINS AEROBIC BOTTLE ONLY CRITICAL RESULT CALLED TO, READ BACK BY AND VERIFIED WITH: Freida Busman FCP Performed at [facility], [place] [address] [place], [place] [address]    Culture GROUP B STREP(S.AGALACTIAE)ISOLATED (A)  Final   Report Status 06/26/2022 FINAL  Final   Organism ID, Bacteria GROUP B STREP(S.AGALACTIAE)ISOLATED  Final      Susceptibility   Group b strep(s.agalactiae)isolated - MIC*    CLINDAMYCIN <=0.25 SENSITIVE Sensitive     AMPICILLIN <=0.25 SENSITIVE Sensitive     ERYTHROMYCIN <=0.12 SENSITIVE Sensitive     VANCOMYCIN 0.5 SENSITIVE Sensitive     CEFTRIAXONE <=0.12 SENSITIVE Sensitive     LEVOFLOXACIN 0.5 SENSITIVE Sensitive      PENICILLIN <=0.06 SENSITIVE Sensitive     * GROUP B STREP(S.AGALACTIAE)ISOLATED  Resp Panel by RT-PCR (Flu A&B, Covid) Anterior Nasal Swab     Status: None   Collection Time: 06/23/22  4:23 PM   Specimen: Anterior Nasal Swab  Result Value Ref Range Status   SARS Coronavirus 2 by RT PCR NEGATIVE NEGATIVE Final    Comment: (NOTE) SARS-CoV-2 target nucleic acids are NOT DETECTED.  The SARS-CoV-2 RNA is generally detectable in upper respiratory specimens during the acute phase of infection. The lowest concentration of SARS-CoV-2 viral copies this assay can detect is 138 copies/mL. A negative result does not preclude SARS-Cov-2 infection and should not be used as the sole basis for treatment or other patient management decisions. A negative result may occur with  improper specimen collection/handling, submission of specimen other than nasopharyngeal swab, presence of viral mutation(s) within the areas targeted by this assay, and inadequate number of viral copies(<138 copies/mL). A negative result must be combined with clinical observations, patient history, and epidemiological information. The expected result is Negative.  Fact Sheet for Patients:  EntrepreneurPulse.com.au  Fact Sheet for Healthcare Providers:  IncredibleEmployment.be  This test is no t yet approved or cleared by the [place] and  has been authorized for detection and/or diagnosis of SARS-CoV-2 by FDA under an Emergency Use Authorization (EUA). This EUA will remain  in effect (meaning this test can be used) for the duration of the COVID-19 declaration under Section 564(b)(1) of the Act, 21 U.S.C.section 360bbb-3(b)(1), unless the authorization is terminated  or revoked sooner.       Influenza A by PCR NEGATIVE NEGATIVE Final   Influenza B by PCR NEGATIVE NEGATIVE Final    Comment: (NOTE) The Xpert Xpress SARS-CoV-2/FLU/RSV plus assay is intended as an aid in the  diagnosis of influenza from Nasopharyngeal swab specimens and should not be used as a sole basis for treatment. Nasal washings and aspirates are unacceptable for Xpert Xpress SARS-CoV-2/FLU/RSV testing.  Fact Sheet for Patients: EntrepreneurPulse.com.au  Fact Sheet for Healthcare Providers: IncredibleEmployment.be  This test is not yet approved or cleared by the [place] FDA and has been authorized for detection and/or diagnosis of SARS-CoV-2 by FDA under an Emergency Use Authorization (EUA). This EUA will remain in effect (meaning this test can be used) for the duration of the COVID-19 declaration under Section 564(b)(1) of the Act, 21 U.S.C. section 360bbb-3(b)(1), unless the authorization is terminated or revoked.  Performed at [facility], [place] [address] [place], [place] [address]   Blood Culture ID Panel (Reflexed)     Status: Abnormal   Collection Time: 06/23/22  4:23 PM  Result Value Ref Range Status   Enterococcus faecalis NOT DETECTED NOT DETECTED Final   Enterococcus Faecium NOT DETECTED NOT DETECTED Final   Listeria monocytogenes NOT DETECTED NOT DETECTED Final   Staphylococcus species NOT DETECTED NOT DETECTED Final   Staphylococcus aureus (BCID) NOT DETECTED NOT DETECTED Final   Staphylococcus epidermidis NOT DETECTED NOT DETECTED Final   Staphylococcus lugdunensis NOT DETECTED NOT DETECTED Final   Streptococcus species DETECTED (A) NOT DETECTED Final    Comment: CRITICAL RESULT CALLED TO, READ BACK BY AND VERIFIED WITH: [place] [id number] FCP    Streptococcus agalactiae DETECTED (A) NOT DETECTED Final    Comment: CRITICAL RESULT CALLED TO, READ BACK BY AND VERIFIED WITH: [place] [id number] FCP    Streptococcus pneumoniae NOT DETECTED NOT DETECTED Final   Streptococcus pyogenes NOT DETECTED NOT DETECTED Final   A.calcoaceticus-baumannii NOT DETECTED NOT DETECTED Final   Bacteroides fragilis NOT  DETECTED NOT DETECTED Final   Enterobacterales NOT DETECTED NOT DETECTED Final   Enterobacter cloacae complex NOT DETECTED NOT DETECTED Final   Escherichia coli NOT DETECTED NOT DETECTED Final   Klebsiella aerogenes NOT DETECTED NOT DETECTED Final   Klebsiella oxytoca NOT DETECTED NOT DETECTED Final   Klebsiella pneumoniae NOT DETECTED NOT DETECTED Final   Proteus species NOT DETECTED NOT DETECTED Final   Salmonella species NOT DETECTED NOT DETECTED Final   Serratia marcescens NOT DETECTED NOT DETECTED Final   Haemophilus influenzae NOT DETECTED NOT DETECTED Final   Neisseria meningitidis NOT DETECTED NOT DETECTED Final   Pseudomonas aeruginosa NOT DETECTED NOT DETECTED Final   Stenotrophomonas maltophilia NOT DETECTED NOT DETECTED Final   Candida albicans NOT DETECTED NOT DETECTED Final   Candida auris NOT DETECTED NOT DETECTED Final   Candida glabrata NOT DETECTED NOT DETECTED Final   Candida krusei NOT DETECTED NOT DETECTED Final   Candida parapsilosis NOT DETECTED NOT DETECTED Final   Candida tropicalis NOT DETECTED NOT DETECTED Final   Cryptococcus neoformans/gattii  NOT DETECTED NOT DETECTED Final    Comment: Performed at [facility], [place] [address] [place], [place] [address]  Culture, blood (Routine X 2) w Reflex to ID Panel     Status: None (Preliminary result)   Collection Time: 06/25/22 12:53 PM   Specimen: BLOOD  Result Value Ref Range Status   Specimen Description BLOOD RIGHT ANTECUBITAL  Final   Special Requests   Final    BOTTLES DRAWN AEROBIC AND ANAEROBIC Blood Culture adequate volume   Culture   Final    NO GROWTH < 24 HOURS Performed at [facility], [place] [address] [place], [place] [address]    Report Status PENDING  Incomplete  Culture, blood (Routine X 2) w Reflex to ID Panel     Status: None (Preliminary result)   Collection Time: 06/25/22 12:58 PM   Specimen: BLOOD RIGHT FOREARM  Result Value Ref Range Status   Specimen Description BLOOD RIGHT  FOREARM  Final   Special Requests   Final    BOTTLES DRAWN AEROBIC AND ANAEROBIC Blood Culture adequate volume   Culture   Final    NO GROWTH < 24 HOURS Performed at [facility], [place] [address], [place], [place] [address]    Report Status PENDING  Incomplete    RADIOLOGY STUDIES/RESULTS: No results found.   LOS: 3 days   Oren Binet, MD  [facility]    To contact the attending provider between 7A-7P or the covering provider during after hours 7P-7A, please log into the web site [email or web address] and access using universal [place] password for that web site. If you do not have the password, please call the hospital operator.  06/26/2022, 10:18 AM

## 2022-06-26 NOTE — Progress Notes (Signed)
[facility] Progress Note   Subjective:   Patient seen and examined in room.  Tolerated dialysis well yesterday.  Plans to proceed with L BKA next week after walking his daughter down the aisle at her wedding this weekend.  Denies CP, SOB, abdominal pain, n/v, fever and chills. Feeling better.   Objective Vitals:   06/25/22 1900 06/25/22 2037 06/25/22 2356 06/26/22 0538  BP:  (!) 159/94 137/79 (!) 147/71  Pulse: 92 (!) 117 96 85  Resp: '20 18 18   '$ Temp:  98.7 F (37.1 C) 98.6 F (37 C) 98.8 F (37.1 C)  TempSrc:  Oral Oral Oral  SpO2: 100% 99% 99% 97%  Weight: 128.5 kg     Height:       Physical Exam General:WDWN male in NAD Heart:RRR, no mrg Lungs:+crackles in RLL, otherwise clear, nml WOB on RA Abdomen:soft, NTND Extremities:R BKA, L foot dressed, 1+ edema Dialysis Access: RU AVF +b/t   Filed Weights   06/24/22 1946 06/25/22 1520 06/25/22 1900  Weight: 125.7 kg 131.1 kg 128.5 kg    Intake/Output Summary (Last 24 hours) at 06/26/2022 0800 Last data filed at 06/25/2022 2359 Gross per 24 hour  Intake 490.79 ml  Output 3300 ml  Net -2809.21 ml    Additional Objective Labs: Basic Metabolic Panel: Recent Labs  Lab 06/23/22 1404 06/23/22 1646 06/24/22 0103 06/24/22 1349 06/25/22 0157  NA 139 137 139  --  135  K 4.1 4.4 4.2  --  3.6  CL 100  --  103  --  101  CO2 25  --  21*  --  22  GLUCOSE 352*  --  196*  --  223*  BUN 34*  --  35*  --  49*  CREATININE 6.07*  --  6.16*  --  7.28*  CALCIUM 8.1*  --  7.8*  --  7.7*  PHOS  --   --   --  4.7* 5.3*   Liver Function Tests: Recent Labs  Lab 06/23/22 1404 06/24/22 0103 06/25/22 0157  AST 20 23  --   ALT 16 14  --   ALKPHOS 133* 94  --   BILITOT 0.9 0.9  --   PROT 6.1* 5.0*  --   ALBUMIN 3.1* 2.4* 2.3*   CBC: Recent Labs  Lab 06/23/22 1404 06/23/22 1646 06/24/22 0103 06/25/22 0157  WBC 13.5*  --  13.4* 9.3  NEUTROABS 12.2*  --   --   --   HGB 11.3* 10.2* 9.5* 9.0*  HCT 33.0* 30.0*  28.7* 26.3*  MCV 93.0  --  94.7 92.0  PLT 141*  --  134* 133*   Blood Culture    Component Value Date/Time   SDES BLOOD RIGHT FOREARM 06/25/2022 1258   SPECREQUEST  06/25/2022 1258    BOTTLES DRAWN AEROBIC AND ANAEROBIC Blood Culture adequate volume   CULT  06/25/2022 1258    NO GROWTH < 24 HOURS Performed at [facility], [address], [place], [place] [address]    REPTSTATUS PENDING 06/25/2022 1258   Medications:   ceFAZolin (ANCEF) IV      amiodarone  100 mg Oral Daily   apixaban  5 mg Oral BID   atorvastatin  40 mg Oral QHS   Chlorhexidine Gluconate Cloth  6 each Topical Q0600   famotidine  20 mg Oral Daily   fentaNYL  1 patch Transdermal D78E   folic acid  1 mg Oral Daily   [name]'s butt cream  Topical TID   [START ON 06/27/2022] influenza vaccine adjuvanted  0.5 mL Intramuscular Tomorrow-1000   insulin aspart  0-9 Units Subcutaneous TID WC   insulin glargine-yfgn  15 Units Subcutaneous QHS   loratadine  10 mg Oral Daily   metoCLOPramide  5 mg Oral TID AC   midodrine  10 mg Oral QODAY   nitroGLYCERIN  0.2 mg Transdermal Daily   nutrition supplement (JUVEN)  1 packet Oral BID BM   pantoprazole  40 mg Oral Daily   pregabalin  100 mg Oral BID   rOPINIRole  2 mg Oral QHS   tamsulosin  0.4 mg Oral Daily    Dialysis Orders: [facility] 4h 66mn  450/800   112kg   2/2 bath  Hep none  LUA AVF - last HD 10/12, post wt 120kg - comes off 5-8kg over usually - mircera 30 mg q 4 wks, last 10/7 - doxercalciferol 2 ug tiw IV tts       BP 120- 160 / 60- 75,  HR 68- 98, RR 14- 23  tmax 103    Na 139  K 4 .2  BUN 35  Cr 6.1       CXR 10/14 - IMPRESSION: No active cardiopulmonary disease.     Assessment/ Plan: Sepsis w/Bacteremia - w/ temp 103, high HR/ RR, L leg erythema c/w cellulitis.  Blood cultures + Group B strep. ABX narrowed to PEN G per pharm.  Repeat BC and TTE ordered per ID. Chronic L lateral foot ulcer - hx chronic osteo, refused BKA when offered on  prior admission. S/p 6 wks IV abx per prior dc summary. Plan for BKA next week. To d/c on PO ABX.  ESRD - on HD TTS. HD today per regular schedule.  PAD - sp R BKA, L ray amp CAD h/o PCI PAF - in NSR here, per pmd  HTN/ vol  - takes norvasc at home - currently held, and pre HD midodrine. Does not get close to his dry wt at OP HD, coming off 5-8 kg over. Large gains. LE edema but no respiratory issues.  Plan for max UF as tolerated.  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 ESA dosed q4wks, if Hgb continues to drop will give early.   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 Nutrition - Renal Diet w/fluid restrictions  LJen Mow PA-C [facility] 06/26/2022,8:00 AM  LOS: 3 days

## 2022-06-26 NOTE — TOC Initial Note (Signed)
Transition of Care [facility]) - Initial/Assessment Note    Patient Details  Name: [name] MRN: [id number] Date of Birth: 1956/01/01  Transition of [facility]) CM/SW Contact:    Milas Gain, [place] Phone Number: 06/26/2022, 3:48 PM  Clinical Narrative:                  CSW received consult for possible SNF placement at time of discharge. CSW spoke with patient regarding PT recommendation of SNF placement at time of discharge. Patient expressed understanding of PT recommendation and declined SNF placement at time of discharge. Patient reports he has supervision at home from spouse,son,brother-n-law, and stepson if needed. Patient interested in [place] CM to follow up on home needs. CSW informed CM. TOC will continue to follow and assist with patients dc planning needs.   Expected Discharge Plan: [place] Barriers to Discharge: Continued Medical Work up   Patient Goals and CMS Choice Patient states their goals for this hospitalization and ongoing recovery are:: to return back home CMS Medicare.gov Compare Post Acute Care list provided to:: Patient Choice offered to / list presented to : Patient  Expected Discharge Plan and Services Expected Discharge Plan: [place] In-house Referral: Clinical Social Work     Living arrangements for the past 2 months: [place]                                      Prior Living Arrangements/Services Living arrangements for the past 2 months: Single Family Home Lives with:: Spouse, Adult Children Patient language and need for interpreter reviewed:: Yes Do you feel safe going back to the place where you live?: Yes      Need for Family Participation in Patient Care: Yes (Comment) Care giver support system in place?: Yes (comment)   Criminal Activity/Legal Involvement Pertinent to Current Situation/Hospitalization: No - Comment as needed  Activities of Daily Living Home Assistive  Devices/Equipment: Crutches, Prosthesis, Walker (specify type), Wheelchair, Shower chair with back ADL Screening (condition at time of admission) Patient's cognitive ability adequate to safely complete daily activities?: Yes Is the patient deaf or have difficulty hearing?: No Does the patient have difficulty seeing, even when wearing glasses/contacts?: No Does the patient have difficulty concentrating, remembering, or making decisions?: No Patient able to express need for assistance with ADLs?: Yes Does the patient have difficulty dressing or bathing?: No Independently performs ADLs?: Yes (appropriate for developmental age) Does the patient have difficulty walking or climbing stairs?: Yes Weakness of Legs: Right Weakness of Arms/Hands: None  Permission Sought/Granted Permission sought to share information with : Case Manager, Family Supports, Chartered certified accountant granted to share information with : Yes, Verbal Permission Granted              Emotional Assessment Appearance:: Appears stated age Attitude/Demeanor/Rapport: Gracious Affect (typically observed): Calm Orientation: : Oriented to Self, Oriented to Place, Oriented to  Time, Oriented to Situation Alcohol / Substance Use: Not Applicable Psych Involvement: No (comment)  Admission diagnosis:  Cellulitis of left lower extremity [L03.116] Chronic osteomyelitis (HCC) [M86.60] Sepsis, due to unspecified organism, unspecified whether acute organ dysfunction present ([place]) [A41.9] Severe sepsis with lactic acidosis ([place]) [A41.9, R65.20, E87.20] Patient Active Problem List   Diagnosis Date Noted   Atrial fibrillation, chronic ([place]) 01/13/2022   Physical deconditioning 01/13/2022   Anemia of renal disease 01/13/2022   Obesity (BMI  30-39.9) 01/13/2022   Decubitus ulcer of buttock 01/13/2022   Intractable nausea and vomiting with epigastric pain 01/11/2022   Chronic osteomyelitis ([place]) 01/11/2022    Proliferative diabetic retinopathy of left eye with macular edema associated with type 2 diabetes mellitus (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[place]) 06/14/2021   ESRD on hemodialysis ([place]) 06/14/2021   Paroxysmal atrial fibrillation with RVR ([place]) 06/14/2021   Carotid stenosis 06/15/2020   Dyslipidemia 06/15/2020   Diabetic macular edema of right eye with proliferative retinopathy associated with type 2 diabetes mellitus ([place]) 01/18/2020   Severe nonproliferative diabetic retinopathy of left eye, with macular edema, associated with type 2 diabetes mellitus ([place]) 01/04/2020   Retinal hemorrhage of right eye 01/04/2020   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   Pseudoclaudication 11/15/2018   Essential hypertension 09/18/2018   S/P BKA (below knee amputation), right ([place]) 09/18/2018   Amputation of toe of left foot ([place]) 09/18/2018   Peripheral artery disease ([place]) 09/18/2018   S/P carotid endarterectomy 09/18/2018   OSA not tolerating CPAP 09/18/2018   Chronic back pain 09/18/2018   Status post reversal of ileostomy 05/21/2018   Normocytic anemia 02/14/2018   Intra-abdominal abscess ([place]) 11/04/2017   Chronic venous hypertension (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 ([place]) 12/11/2016   PAD (peripheral artery disease) ([place]) 11/12/2016   Unilateral primary osteoarthritis, left knee 10/11/2016   History of right below knee amputation ([place]) 07/12/2016   Infected diabetic left foot ulcer with chronic osteomyelitis    Diabetic foot infection ([place]) 05/04/2016   Insulin dependent type 2 diabetes mellitus ([place]) 12/16/2015   Amputated toe ([place]) 10/21/2015   Cellulitis of left lower extremity 09/03/2015   Leg edema, left 09/03/2015   CAD (dz of  distal, mid and proximal RCA with implantation of 3 overlapping drug-eluting stent,) 09/03/2015   Chronic diastolic heart failure, NYHA class 2 ([place]) 09/03/2015   Angina pectoris associated with type 2 diabetes mellitus ([place]) 10/28/2014   Hyperlipidemia 08/25/2014   Limb pain 03/20/2013   DJD (degenerative joint disease) 09/25/2012   Migraine 09/25/2012   Neuropathy 09/25/2012   Restless legs syndrome (RLS) 09/25/2012   Chronic obstructive pulmonary disease, unspecified ([place]) 04/25/2012   Unknown cause of morbidity or mortality 04/25/2012   Chronic total occlusion of artery of the extremities ([place]) 04/08/2012   Onychomycosis 02/01/2012   Occlusion and stenosis of carotid artery without mention of cerebral infarction 06/12/2011   GERD (gastroesophageal reflux disease) 05/08/2011   Barrett's esophagus without dysplasia 05/08/2011   Former tobacco use 02/01/2011   PCP:  [name], PA-C Pharmacy:   [id number] [place], [address] [place] [address]Phone: [phone]: [phone]    Social Determinants of Health (SDOH) Interventions    Readmission Risk Interventions    06/30/2021   11:53 AM  Readmission Risk Prevention Plan  Transportation Screening Complete  Medication Review (RN Care Manager) Complete  HRI or [place]  SW Recovery Care/Counseling Consult Complete  Palliative Care Screening Not A[place]erville[place] Applicable

## 2022-06-27 ENCOUNTER — Encounter (HOSPITAL_COMMUNITY): Payer: Self-pay

## 2022-06-27 DIAGNOSIS — R6520 Severe sepsis without septic shock: Secondary | ICD-10-CM | POA: Diagnosis not present

## 2022-06-27 DIAGNOSIS — A419 Sepsis, unspecified organism: Secondary | ICD-10-CM | POA: Diagnosis not present

## 2022-06-27 DIAGNOSIS — E872 Acidosis, unspecified: Secondary | ICD-10-CM | POA: Diagnosis not present

## 2022-06-27 LAB — RENAL FUNCTION PANEL
Albumin: 2.4 g/dL — ABNORMAL LOW (ref 3.5–5.0)
Anion gap: 12 (ref 5–15)
BUN: 20 mg/dL (ref 8–23)
CO2: 28 mmol/L (ref 22–32)
Calcium: 8 mg/dL — ABNORMAL LOW (ref 8.9–10.3)
Chloride: 97 mmol/L — ABNORMAL LOW (ref 98–111)
Creatinine, Ser: 4.07 mg/dL — ABNORMAL HIGH (ref 0.61–1.24)
GFR, Estimated: 15 mL/min — ABNORMAL LOW (ref >60)
Glucose, Bld: 351 mg/dL — ABNORMAL HIGH (ref 70–99)
Phosphorus: 2.8 mg/dL (ref 2.5–4.6)
Potassium: 3.5 mmol/L (ref 3.5–5.1)
Sodium: 137 mmol/L (ref 135–145)

## 2022-06-27 LAB — CBC
HCT: 29.2 % — ABNORMAL LOW (ref 39.0–52.0)
Hemoglobin: 10.1 g/dL — ABNORMAL LOW (ref 13.0–17.0)
MCH: 31.3 pg (ref 26.0–34.0)
MCHC: 34.6 g/dL (ref 30.0–36.0)
MCV: 90.4 fL (ref 80.0–100.0)
Platelets: 126 10*3/uL — ABNORMAL LOW (ref 150–400)
RBC: 3.23 MIL/uL — ABNORMAL LOW (ref 4.22–5.81)
RDW: 13.2 % (ref 11.5–15.5)
WBC: 4.9 10*3/uL (ref 4.0–10.5)
nRBC: 0 % (ref 0.0–0.2)

## 2022-06-27 LAB — ECHOCARDIOGRAM COMPLETE
Area-P 1/2: 2.61 cm2
Height: 74 in
S' Lateral: 3.1 cm
Weight: 4433.89 oz

## 2022-06-27 LAB — GLUCOSE, CAPILLARY
Glucose-Capillary: 242 mg/dL — ABNORMAL HIGH (ref 70–99)
Glucose-Capillary: 289 mg/dL — ABNORMAL HIGH (ref 70–99)

## 2022-06-27 MED ORDER — PROSOURCE PLUS PO LIQD
30.0000 mL | Freq: Two times a day (BID) | ORAL | Status: DC
  Administered 2022-06-29: 30 mL via ORAL
  Filled 2022-06-27 (×2): qty 30

## 2022-06-27 MED ORDER — INSULIN GLARGINE-YFGN 100 UNIT/ML ~~LOC~~ SOLN
20.0000 [IU] | Freq: Every day | SUBCUTANEOUS | Status: DC
  Administered 2022-06-27 – 2022-06-29 (×3): 20 [IU] via SUBCUTANEOUS
  Filled 2022-06-27 (×3): qty 0.2

## 2022-06-27 MED ORDER — INSULIN ASPART 100 UNIT/ML IJ SOLN
4.0000 [IU] | Freq: Three times a day (TID) | INTRAMUSCULAR | Status: DC
  Administered 2022-06-28 – 2022-06-29 (×5): 4 [IU] via SUBCUTANEOUS

## 2022-06-27 NOTE — Plan of Care (Signed)
Problem: Fluid Volume: Goal: Hemodynamic stability will improve 06/27/2022 1611 by Drucie Ip I, RN Outcome: Progressing 06/27/2022 1610 by Drucie Ip I, RN Outcome: Progressing   Problem: Clinical Measurements: Goal: Diagnostic test results will improve 06/27/2022 1611 by Drucie Ip I, RN Outcome: Progressing 06/27/2022 1610 by Drucie Ip I, RN Outcome: Progressing Goal: Signs and symptoms of infection will decrease 06/27/2022 1611 by Drucie Ip I, RN Outcome: Progressing 06/27/2022 1610 by Drucie Ip I, RN Outcome: Progressing   Problem: Respiratory: Goal: Ability to maintain adequate ventilation will improve 06/27/2022 1611 by Drucie Ip I, RN Outcome: Progressing 06/27/2022 1610 by Drucie Ip I, RN Outcome: Progressing   Problem: Education: Goal: Ability to describe self-care measures that may prevent or decrease complications (Diabetes Survival Skills Education) will improve 06/27/2022 1611 by Drucie Ip I, RN Outcome: Progressing 06/27/2022 1610 by Drucie Ip I, RN Outcome: Progressing Goal: Individualized Educational Video(s) 06/27/2022 1611 by Drucie Ip I, RN Outcome: Progressing 06/27/2022 1610 by Drucie Ip I, RN Outcome: Progressing   Problem: Coping: Goal: Ability to adjust to condition or change in health will improve 06/27/2022 1611 by Drucie Ip I, RN Outcome: Progressing 06/27/2022 1610 by Drucie Ip I, RN Outcome: Progressing   Problem: Fluid Volume: Goal: Ability to maintain a balanced intake and output will improve 06/27/2022 1611 by Drucie Ip I, RN Outcome: Progressing 06/27/2022 1610 by Drucie Ip I, RN Outcome: Progressing   Problem: Health Behavior/Discharge Planning: Goal: Ability to identify and utilize available resources and services will improve 06/27/2022 1611 by Drucie Ip I, RN Outcome: Progressing 06/27/2022  1610 by Drucie Ip I, RN Outcome: Progressing Goal: Ability to manage health-related needs will improve 06/27/2022 1611 by Drucie Ip I, RN Outcome: Progressing 06/27/2022 1610 by Drucie Ip I, RN Outcome: Progressing   Problem: Metabolic: Goal: Ability to maintain appropriate glucose levels will improve 06/27/2022 1611 by Drucie Ip I, RN Outcome: Progressing 06/27/2022 1610 by Drucie Ip I, RN Outcome: Progressing   Problem: Nutritional: Goal: Maintenance of adequate nutrition will improve 06/27/2022 1611 by Drucie Ip I, RN Outcome: Progressing 06/27/2022 1610 by Drucie Ip I, RN Outcome: Progressing Goal: Progress toward achieving an optimal weight will improve 06/27/2022 1611 by Drucie Ip I, RN Outcome: Progressing 06/27/2022 1610 by Drucie Ip I, RN Outcome: Progressing   Problem: Skin Integrity: Goal: Risk for impaired skin integrity will decrease 06/27/2022 1611 by Drucie Ip I, RN Outcome: Progressing 06/27/2022 1610 by Drucie Ip I, RN Outcome: Progressing   Problem: Tissue Perfusion: Goal: Adequacy of tissue perfusion will improve 06/27/2022 1611 by Drucie Ip I, RN Outcome: Progressing 06/27/2022 1610 by Drucie Ip I, RN Outcome: Progressing   Problem: Education: Goal: Knowledge of General Education information will improve Description: Including pain rating scale, medication(s)/side effects and non-pharmacologic comfort measures 06/27/2022 1611 by Drucie Ip I, RN Outcome: Progressing 06/27/2022 1610 by Drucie Ip I, RN Outcome: Progressing   Problem: Health Behavior/Discharge Planning: Goal: Ability to manage health-related needs will improve 06/27/2022 1611 by Drucie Ip I, RN Outcome: Progressing 06/27/2022 1610 by Drucie Ip I, RN Outcome: Progressing   Problem: Clinical Measurements: Goal: Ability to maintain clinical  measurements within normal limits will improve 06/27/2022 1611 by Drucie Ip I, RN Outcome: Progressing 06/27/2022 1610 by Drucie Ip I, RN Outcome: Progressing Goal: Will remain free from infection 06/27/2022 1611 by Drucie Ip I, RN Outcome: Progressing 06/27/2022 1610 by Drucie Ip I, RN Outcome: Progressing Goal: Diagnostic test results will improve 06/27/2022 1611 by Drucie Ip I, RN Outcome: Progressing 06/27/2022  1610 by Drucie Ip I, RN Outcome: Progressing Goal: Respiratory complications will improve 06/27/2022 1611 by Drucie Ip I, RN Outcome: Progressing 06/27/2022 1610 by Drucie Ip I, RN Outcome: Progressing Goal: Cardiovascular complication will be avoided 06/27/2022 1611 by Drucie Ip I, RN Outcome: Progressing 06/27/2022 1610 by Drucie Ip I, RN Outcome: Progressing   Problem: Activity: Goal: Risk for activity intolerance will decrease 06/27/2022 1611 by Drucie Ip I, RN Outcome: Progressing 06/27/2022 1610 by Drucie Ip I, RN Outcome: Progressing   Problem: Nutrition: Goal: Adequate nutrition will be maintained 06/27/2022 1611 by Drucie Ip I, RN Outcome: Progressing 06/27/2022 1610 by Drucie Ip I, RN Outcome: Progressing   Problem: Coping: Goal: Level of anxiety will decrease 06/27/2022 1611 by Drucie Ip I, RN Outcome: Progressing 06/27/2022 1610 by Drucie Ip I, RN Outcome: Progressing   Problem: Elimination: Goal: Will not experience complications related to bowel motility 06/27/2022 1611 by Drucie Ip I, RN Outcome: Progressing 06/27/2022 1610 by Drucie Ip I, RN Outcome: Progressing Goal: Will not experience complications related to urinary retention 06/27/2022 1611 by Drucie Ip I, RN Outcome: Progressing 06/27/2022 1610 by Drucie Ip I, RN Outcome: Progressing   Problem: Pain Managment: Goal:  General experience of comfort will improve 06/27/2022 1611 by Drucie Ip I, RN Outcome: Progressing 06/27/2022 1610 by Drucie Ip I, RN Outcome: Progressing   Problem: Safety: Goal: Ability to remain free from injury will improve 06/27/2022 1611 by Drucie Ip I, RN Outcome: Progressing 06/27/2022 1610 by Drucie Ip I, RN Outcome: Progressing   Problem: Skin Integrity: Goal: Risk for impaired skin integrity will decrease 06/27/2022 1611 by Drucie Ip I, RN Outcome: Progressing 06/27/2022 1610 by Drucie Ip I, RN Outcome: Progressing

## 2022-06-27 NOTE — Progress Notes (Signed)
OT Cancellation Note  Patient Details Name: [name] MRN: [id number] DOB: [birth year]   Cancelled Treatment:    Reason Eval/Treat Not Completed: Patient declined, no reason specified  Lynnda Child, OTD, OTR/L Acute Rehab [phone]) [phone] - [place] 06/27/2022, 10:19 AM

## 2022-06-27 NOTE — Progress Notes (Signed)
[facility] Progress Note   Subjective:   Patient seen and examined at bedside.  Reports cramping in HD yesterday.  Denies CP, SOB, abdominal pain and n/v/d.  No specific complaints.   Objective Vitals:   06/26/22 2001 06/27/22 0039 06/27/22 0627 06/27/22 0808  BP: (!) 148/93 (!) 133/59 (!) 164/67 (!) 156/63  Pulse: 100 80 (!) 113 79  Resp: '18 16 18 17  '$ Temp: 98.8 F (37.1 C) 99 F (37.2 C) 98.6 F (37 C) 98 F (36.7 C)  TempSrc: Oral Oral Oral Oral  SpO2:  98% 100% 100%  Weight:      Height:       Physical Exam General:WDWN male in NAD Heart:RRR, no mrg Lungs:mostly CTAB Abdomen:soft, NTND Extremities:Trace edema on L, foot dressed, R BKA w/prothesis in place Dialysis Access: LU AVF +b/t   Filed Weights   06/25/22 1520 06/25/22 1900 06/26/22 1457  Weight: 131.1 kg 128.5 kg 126.3 kg    Intake/Output Summary (Last 24 hours) at 06/27/2022 0857 Last data filed at 06/26/2022 1943 Gross per 24 hour  Intake 103.33 ml  Output 5200 ml  Net -5096.67 ml    Additional Objective Labs: Basic Metabolic Panel: Recent Labs  Lab 06/25/22 0157 06/26/22 1513 06/27/22 0203  NA 135 136 137  K 3.6 3.5 3.5  CL 101 98 97*  CO2 '22 23 28  '$ GLUCOSE 223* 238* 351*  BUN 49* 35* 20  CREATININE 7.28* 5.93* 4.07*  CALCIUM 7.7* 7.6* 8.0*  PHOS 5.3* 3.2 2.8   Liver Function Tests: Recent Labs  Lab 06/23/22 1404 06/24/22 0103 06/25/22 0157 06/26/22 1513 06/27/22 0203  AST 20 23  --   --   --   ALT 16 14  --   --   --   ALKPHOS 133* 94  --   --   --   BILITOT 0.9 0.9  --   --   --   PROT 6.1* 5.0*  --   --   --   ALBUMIN 3.1* 2.4* 2.3* 2.4* 2.4*    CBC: Recent Labs  Lab 06/23/22 1404 06/23/22 1646 06/24/22 0103 06/25/22 0157 06/26/22 1513 06/27/22 0203  WBC 13.5*  --  13.4* 9.3 6.0 4.9  NEUTROABS 12.2*  --   --   --   --   --   HGB 11.3*   < > 9.5* 9.0* 9.8* 10.1*  HCT 33.0*   < > 28.7* 26.3* 28.6* 29.2*  MCV 93.0  --  94.7 92.0 92.0 90.4  PLT  141*  --  134* 133* 137* 126*   < > = values in this interval not displayed.    Medications:   ceFAZolin (ANCEF) IV Stopped (06/26/22 2006)    amiodarone  100 mg Oral Daily   apixaban  5 mg Oral BID   atorvastatin  40 mg Oral QHS   Chlorhexidine Gluconate Cloth  6 each Topical Q0600   famotidine  20 mg Oral Daily   fentaNYL  1 patch Transdermal Y85O   folic acid  1 mg Oral Daily   [name]'s butt cream   Topical TID   influenza vaccine adjuvanted  0.5 mL Intramuscular Tomorrow-1000   insulin aspart  0-9 Units Subcutaneous TID WC   insulin glargine-yfgn  15 Units Subcutaneous QHS   loratadine  10 mg Oral Daily   metoCLOPramide  5 mg Oral TID AC   midodrine  10 mg Oral Q T,Th,Sa-HD   nitroGLYCERIN  0.2 mg Transdermal Daily  nutrition supplement (JUVEN)  1 packet Oral BID BM   pantoprazole  40 mg Oral Daily   pregabalin  100 mg Oral BID   rOPINIRole  2 mg Oral QHS   tamsulosin  0.4 mg Oral Daily    Dialysis Orders: [facility] 4h 28mn  450/800   112kg   2/2 bath  Hep none  LUA AVF - last HD 10/12, post wt 120kg - comes off 5-8kg over usually - mircera 30 mg q 4 wks, last 10/7 - doxercalciferol 2 ug tiw IV tts       BP 120- 160 / 60- 75,  HR 68- 98, RR 14- 23  tmax 103    Na 139  K 4 .2  BUN 35  Cr 6.1       CXR 10/14 - IMPRESSION: No active cardiopulmonary disease.     Assessment/ Plan: Sepsis w/Bacteremia - w/ temp 103 on admit, high HR/ RR, L leg erythema c/w cellulitis. Now afebrile. Blood cultures + Group B strep. ABX narrowed to PEN G per pharm.  Repeat BC and TTE ordered per ID. 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 S/p 6 wks IV abx per prior dc summary. Plan for BKA next week. To d/c on PO ABX.  ESRD - on HD TTS. HD tomorrow per regular schedule.  PAD - sp R BKA, L ray amp CAD h/o PCI PAF - in NSR here, per pmd  HTN/ vol  - takes amlodipine at home - currently held, and pre HD midodrine. Bp elevated this AM,  if remains ^, restart amlodipine. Does not get close to his dry wt at OP HD, coming off 5-8 kg over. Large gains. LE edema but no respiratory issues.  Volume status improving, continue to titrate down volume as tolerated.  Anemia esrd - Hgb 10.1. ESA dosed q4wks, if Hgb drops will give early.   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 Nutrition - Renal Diet w/fluid restrictions. Alb 2.4 - give protein supplements.   LJen Mow PA-C [facility] 06/27/2022,8:57 AM  LOS: 4 days

## 2022-06-27 NOTE — Progress Notes (Signed)
[facility]) Hospital Liaison note:  This patient is currently enrolled in [facility] outpatient-based Palliative Care. Will continue to follow for disposition.  Please call with any outpatient palliative questions or concerns.  Thank you, Lorelee Market, LPN [facility] Liaison [phone]

## 2022-06-27 NOTE — Progress Notes (Signed)
PROGRESS NOTE    [name]  DGU:[id number] DOB: July 12, 1956 DOA: 06/23/2022 PCP: [name], PA-C  Chief Complaint  Patient presents with   Weakness    Brief Narrative:  Patient is [name] 66 y.o.  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  See below for further details.   Assessment & Plan:   Principal Problem:   Severe sepsis with lactic acidosis (HCC) Active Problems:   Infected diabetic left foot ulcer with chronic osteomyelitis   Essential hypertension   ESRD on hemodialysis (HCC)   Decubitus ulcer of buttock   CAD (dz of distal, mid and proximal RCA with implantation of 3 overlapping drug-eluting stent,)   Chronic diastolic heart failure, NYHA class 2 (HCC)   Insulin dependent type 2 diabetes mellitus (HCC)   PAD (peripheral artery disease) (HCC)   Atrial fibrillation, chronic ([place])   Cellulitis of left lower extremity   Chronic osteomyelitis ([place])   Assessment and Plan: Severe sepsis due to group B strep bacteremia Sepsis physiology resolved Left foot likely the source of bacteremia Repeat blood culture on 10/16 negative so far Echo done on 10/16-but not yet read (not sure issue, reached out to [name] today who responded, will follow back up again tomorrow) On IV Ancef Has agreed to proceed with left BKA but wants to postpone till next week as daughter getting married this weekend. ID recommending 2 weeks ancef with HD, follow with ortho for L BKA, call back if cx positive from 10/16 or TTE with significant findings   Left diabetic foot Chronic left lateral foot ulcer with underlying chronic osteomyelitis See above-Per prior discharge summary-refused BKA when  offered.  Has completed 6 weeks of IV antibiotics per prior discharge summary. Has finally consented to left BKA but wants to postpone till next week-see above. [name] following.   Acute metabolic encephalopathy Due to severe sepsis/bacteremia  Resolved-completely awake/alert  PAD S/p left ray amputation, right BKA Has had multiple PCI's to left lower extremity in 2022. Continue statin   CAD-s/p PCI to RCA 2016 Denies any chest pain Continue statin   PAF Maintaining sinus rhythm Continue amiodarone Continue Eliquis   ESRD on HD TTS Missed HD on 10/14 Volume status/electrolytes stable Nephrology following and directing HD care.   Chronic HFpEF Euvolemic Volume removal with HD   BPH Continue Flomax   DM-2 (A1c 7.6 on 5/4) CBG stable Basal/bolus, adjust prn Follow/optimize  Chronic pain syndrome Peripheral neuropathy RLS Continue Requip/Lyrica/fentanyl patch and as needed oxycodone. Watch for sedation.   Debility/deconditioning PT/OT eval  Obesity Body mass index is 35.75 kg/m.     DVT prophylaxis: eliquis Code Status: full Family Communication: none Disposition:   Status is: Inpatient Remains inpatient appropriate because: pending echo results, safe d/c plan   Consultants:  Renal ID ortho  Procedures:  Echo pending  Antimicrobials:  Anti-infectives (From admission, onward)    Start     Dose/Rate Route Frequency Ordered Stop   06/26/22 1200  vancomycin (VANCOCIN) IVPB 1000 mg/200 mL premix  Status:  Discontinued        1,000 mg 200 mL/hr over 60 Minutes Intravenous Every T-Th-Sa (Hemodialysis) 06/23/22 2149 06/24/22 1959   06/26/22 1200  ceFAZolin (ANCEF) IVPB 2g/100  mL premix        2 g 200 mL/hr over 30 Minutes Intravenous Every T-Th-Sa (Hemodialysis) 06/25/22 1559 07/09/22 2359   06/25/22 1800  ceFAZolin (ANCEF) IVPB 2g/100 mL premix        2 g 200 mL/hr over 30 Minutes Intravenous  Once 06/25/22 1559 06/25/22 2328   06/24/22 2200   penicillin G potassium 1 Million Units in dextrose 5 % 50 mL IVPB  Status:  Discontinued        1 Million Units 100 mL/hr over 30 Minutes Intravenous Every 6 hours 06/24/22 1959 06/25/22 1559   06/24/22 1600  cefTRIAXone (ROCEPHIN) 2 g in sodium chloride 0.9 % 100 mL IVPB  Status:  Discontinued        2 g 200 mL/hr over 30 Minutes Intravenous Every 24 hours 06/23/22 2006 06/24/22 1959   06/23/22 2200  metroNIDAZOLE (FLAGYL) tablet 500 mg  Status:  Discontinued        500 mg Oral Every 12 hours 06/23/22 2006 06/24/22 1959   06/23/22 2030  vancomycin (VANCOCIN) 2,250 mg in sodium chloride 0.9 % 500 mL IVPB        2,250 mg 261.3 mL/hr over 120 Minutes Intravenous  Once 06/23/22 2029 06/24/22 0052   06/23/22 1615  cefTRIAXone (ROCEPHIN) 1 g in sodium chloride 0.9 % 100 mL IVPB        1 g 200 mL/hr over 30 Minutes Intravenous  Once 06/23/22 1606 06/23/22 1700       Subjective: No new complaints  Objective: Vitals:   06/27/22 0808 06/27/22 1132 06/27/22 1559 06/27/22 1935  BP: (!) 156/63 (!) 144/70 (!) 148/49   Pulse: 79 82 83 91  Resp: '17 16 16 18  '$ Temp: 98 F (36.7 C) 98 F (36.7 C) 98.6 F (37 C) 99.1 F (37.3 C)  TempSrc: Oral Oral Oral Oral  SpO2: 100% 100% 98%   Weight:      Height:       No intake or output data in the 24 hours ending 06/27/22 1943 Filed Weights   06/25/22 1520 06/25/22 1900 06/26/22 1457  Weight: 131.1 kg 128.5 kg 126.3 kg    Examination:  General exam: Appears calm and comfortable  Respiratory system: unlabored Cardiovascular system: RRR Gastrointestinal system: Abdomen is nondistended, soft and nontender.  Central nervous system: Alert and oriented. No focal neurological deficits. Extremities: dressing to LLE, R AKA   Data Reviewed: I have personally reviewed following labs and imaging studies  CBC: Recent Labs  Lab 06/23/22 1404 06/23/22 1646 06/24/22 0103 06/25/22 0157 06/26/22 1513 06/27/22 0203  WBC 13.5*  --  13.4* 9.3 6.0 4.9   NEUTROABS 12.2*  --   --   --   --   --   HGB 11.3* 10.2* 9.5* 9.0* 9.8* 10.1*  HCT 33.0* 30.0* 28.7* 26.3* 28.6* 29.2*  MCV 93.0  --  94.7 92.0 92.0 90.4  PLT 141*  --  134* 133* 137* 126*    Basic Metabolic Panel: Recent Labs  Lab 06/23/22 1404 06/23/22 1646 06/24/22 0103 06/24/22 1349 06/25/22 0157 06/26/22 1513 06/27/22 0203  NA 139 137 139  --  135 136 137  K 4.1 4.4 4.2  --  3.6 3.5 3.5  CL 100  --  103  --  101 98 97*  CO2 25  --  21*  --  '22 23 28  '$ GLUCOSE 352*  --  196*  --  223* 238* 351*  BUN 34*  --  35*  --  49* 35* 20  CREATININE 6.07*  --  6.16*  --  7.28* 5.93* 4.07*  CALCIUM 8.1*  --  7.8*  --  7.7* 7.6* 8.0*  PHOS  --   --   --  4.7* 5.3* 3.2 2.8    GFR: Estimated Creatinine Clearance: 24.5 mL/min (Graycee Greeson) (by C-G formula based on SCr of 4.07 mg/dL (H)).  Liver Function Tests: Recent Labs  Lab 06/23/22 1404 06/24/22 0103 06/25/22 0157 06/26/22 1513 06/27/22 0203  AST 20 23  --   --   --   ALT 16 14  --   --   --   ALKPHOS 133* 94  --   --   --   BILITOT 0.9 0.9  --   --   --   PROT 6.1* 5.0*  --   --   --   ALBUMIN 3.1* 2.4* 2.3* 2.4* 2.4*    CBG: Recent Labs  Lab 06/25/22 2117 06/26/22 0807 06/26/22 1255 06/27/22 0807 06/27/22 1131  GLUCAP 320* 259* 320* 242* 289*     Recent Results (from the past 240 hour(s))  Culture, blood (Routine x 2)     Status: None (Preliminary result)   Collection Time: 06/23/22  2:04 PM   Specimen: BLOOD  Result Value Ref Range Status   Specimen Description BLOOD RIGHT ANTECUBITAL  Final   Special Requests   Final    BOTTLES DRAWN AEROBIC ONLY Blood Culture adequate volume   Culture   Final    NO GROWTH 4 DAYS Performed at [facility], [place] [address], [place], [place] [address]    Report Status PENDING  Incomplete  Culture, blood (Routine x 2)     Status: Abnormal   Collection Time: 06/23/22  4:23 PM   Specimen: BLOOD RIGHT ARM  Result Value Ref Range Status   Specimen Description BLOOD RIGHT  ARM  Final   Special Requests   Final    BOTTLES DRAWN AEROBIC AND ANAEROBIC Blood Culture adequate volume   Culture  Setup Time   Final    GRAM POSITIVE COCCI IN CHAINS AEROBIC BOTTLE ONLY CRITICAL RESULT CALLED TO, READ BACK BY AND VERIFIED WITH: Freida Busman FCP Performed at [facility], [place] [address] [place], [place] [address]    Culture GROUP B STREP(S.AGALACTIAE)ISOLATED (Damond Borchers)  Final   Report Status 06/26/2022 FINAL  Final   Organism ID, Bacteria GROUP B STREP(S.AGALACTIAE)ISOLATED  Final      Susceptibility   Group b strep(s.agalactiae)isolated - MIC*    CLINDAMYCIN <=0.25 SENSITIVE Sensitive     AMPICILLIN <=0.25 SENSITIVE Sensitive     ERYTHROMYCIN <=0.12 SENSITIVE Sensitive     VANCOMYCIN 0.5 SENSITIVE Sensitive     CEFTRIAXONE <=0.12 SENSITIVE Sensitive     LEVOFLOXACIN 0.5 SENSITIVE Sensitive     PENICILLIN <=0.06 SENSITIVE Sensitive     * GROUP B STREP(S.AGALACTIAE)ISOLATED  Resp Panel by RT-PCR (Flu Usiel Astarita&B, Covid) Anterior Nasal Swab     Status: None   Collection Time: 06/23/22  4:23 PM   Specimen: Anterior Nasal Swab  Result Value Ref Range Status   SARS Coronavirus 2 by RT PCR NEGATIVE NEGATIVE Final    Comment: (NOTE) SARS-CoV-2 target nucleic acids are NOT DETECTED.  The SARS-CoV-2 RNA is generally detectable in upper respiratory specimens during the acute phase of infection. The lowest concentration of SARS-CoV-2 viral copies this assay can detect is 138 copies/mL. Dreana Britz negative result does not preclude SARS-Cov-2 infection  and should not be used as the sole basis for treatment or other patient management decisions. Kerwin Augustus negative result may occur with  improper specimen collection/handling, submission of specimen other than nasopharyngeal swab, presence of viral mutation(s) within the areas targeted by this assay, and inadequate number of viral copies(<138 copies/mL). Nera Haworth negative result must be combined with clinical observations, patient  history, and epidemiological information. The expected result is Negative.  Fact Sheet for Patients:  EntrepreneurPulse.com.au  Fact Sheet for Healthcare Providers:  IncredibleEmployment.be  This test is no t yet approved or cleared by the [place] FDA and  has been authorized for detection and/or diagnosis of SARS-CoV-2 by FDA under an Emergency Use Authorization (EUA). This EUA will remain  in effect (meaning this test can be used) for the duration of the COVID-19 declaration under Section 564(b)(1) of the Act, 21 U.S.C.section 360bbb-3(b)(1), unless the authorization is terminated  or revoked sooner.       Influenza Bryten Maher by PCR NEGATIVE NEGATIVE Final   Influenza B by PCR NEGATIVE NEGATIVE Final    Comment: (NOTE) The Xpert Xpress SARS-CoV-2/FLU/RSV plus assay is intended as an aid in the diagnosis of influenza from Nasopharyngeal swab specimens and should not be used as Donavan Kerlin sole basis for treatment. Nasal washings and aspirates are unacceptable for Xpert Xpress SARS-CoV-2/FLU/RSV testing.  Fact Sheet for Patients: EntrepreneurPulse.com.au  Fact Sheet for Healthcare Providers: IncredibleEmployment.be  This test is not yet approved or cleared by the [place] FDA and has been authorized for detection and/or diagnosis of SARS-CoV-2 by FDA under an Emergency Use Authorization (EUA). This EUA will remain in effect (meaning this test can be used) for the duration of the COVID-19 declaration under Section 564(b)(1) of the Act, 21 U.S.C. section 360bbb-3(b)(1), unless the authorization is terminated or revoked.  Performed at [facility], [place] [address] [place], [place] [address]   Blood Culture ID Panel (Reflexed)     Status: Abnormal   Collection Time: 06/23/22  4:23 PM  Result Value Ref Range Status   Enterococcus faecalis NOT DETECTED NOT DETECTED Final   Enterococcus Faecium NOT DETECTED  NOT DETECTED Final   Listeria monocytogenes NOT DETECTED NOT DETECTED Final   Staphylococcus species NOT DETECTED NOT DETECTED Final   Staphylococcus aureus (BCID) NOT DETECTED NOT DETECTED Final   Staphylococcus epidermidis NOT DETECTED NOT DETECTED Final   Staphylococcus lugdunensis NOT DETECTED NOT DETECTED Final   Streptococcus species DETECTED (Dontavious Emily) NOT DETECTED Final    Comment: CRITICAL RESULT CALLED TO, READ BACK BY AND VERIFIED WITH: [place] [id number] FCP    Streptococcus agalactiae DETECTED (Samarion Ehle) NOT DETECTED Final    Comment: CRITICAL RESULT CALLED TO, READ BACK BY AND VERIFIED WITH: [place] [id number] FCP    Streptococcus pneumoniae NOT DETECTED NOT DETECTED Final   Streptococcus pyogenes NOT DETECTED NOT DETECTED Final   Chaniece Barbato.calcoaceticus-baumannii NOT DETECTED NOT DETECTED Final   Bacteroides fragilis NOT DETECTED NOT DETECTED Final   Enterobacterales NOT DETECTED NOT DETECTED Final   Enterobacter cloacae complex NOT DETECTED NOT DETECTED Final   Escherichia coli NOT DETECTED NOT DETECTED Final   Klebsiella aerogenes NOT DETECTED NOT DETECTED Final   Klebsiella oxytoca NOT DETECTED NOT DETECTED Final   Klebsiella pneumoniae NOT DETECTED NOT DETECTED Final   Proteus species NOT DETECTED NOT DETECTED Final   Salmonella species NOT DETECTED NOT DETECTED Final   Serratia marcescens NOT DETECTED NOT DETECTED Final   Haemophilus influenzae NOT DETECTED NOT DETECTED Final  Neisseria meningitidis NOT DETECTED NOT DETECTED Final   Pseudomonas aeruginosa NOT DETECTED NOT DETECTED Final   Stenotrophomonas maltophilia NOT DETECTED NOT DETECTED Final   Candida albicans NOT DETECTED NOT DETECTED Final   Candida auris NOT DETECTED NOT DETECTED Final   Candida glabrata NOT DETECTED NOT DETECTED Final   Candida krusei NOT DETECTED NOT DETECTED Final   Candida parapsilosis NOT DETECTED NOT DETECTED Final   Candida tropicalis NOT DETECTED NOT DETECTED Final    Cryptococcus neoformans/gattii NOT DETECTED NOT DETECTED Final    Comment: Performed at [facility], [place] [address] [place], [place] [address]  Culture, blood (Routine X 2) w Reflex to ID Panel     Status: None (Preliminary result)   Collection Time: 06/25/22 12:53 PM   Specimen: BLOOD  Result Value Ref Range Status   Specimen Description BLOOD RIGHT ANTECUBITAL  Final   Special Requests   Final    BOTTLES DRAWN AEROBIC AND ANAEROBIC Blood Culture adequate volume   Culture   Final    NO GROWTH 2 DAYS Performed at [facility], [place] [address] [place], [place] [address]    Report Status PENDING  Incomplete  Culture, blood (Routine X 2) w Reflex to ID Panel     Status: None (Preliminary result)   Collection Time: 06/25/22 12:58 PM   Specimen: BLOOD RIGHT FOREARM  Result Value Ref Range Status   Specimen Description BLOOD RIGHT FOREARM  Final   Special Requests   Final    BOTTLES DRAWN AEROBIC AND ANAEROBIC Blood Culture adequate volume   Culture   Final    NO GROWTH 2 DAYS Performed at [facility], [place] [address] [place], [place] [address]    Report Status PENDING  Incomplete         Radiology Studies: No results found.      Scheduled Meds:  (feeding supplement) PROSource Plus  30 mL Oral BID BM   amiodarone  100 mg Oral Daily   apixaban  5 mg Oral BID   atorvastatin  40 mg Oral QHS   Chlorhexidine Gluconate Cloth  6 each Topical Q0600   famotidine  20 mg Oral Daily   fentaNYL  1 patch Transdermal G92J   folic acid  1 mg Oral Daily   [name]'s butt cream   Topical TID   insulin aspart  0-9 Units Subcutaneous TID WC   insulin glargine-yfgn  15 Units Subcutaneous QHS   loratadine  10 mg Oral Daily   metoCLOPramide  5 mg Oral TID AC   midodrine  10 mg Oral Q T,Th,Sa-HD   nitroGLYCERIN  0.2 mg Transdermal Daily   nutrition supplement (JUVEN)  1 packet Oral BID BM   pantoprazole  40 mg Oral Daily   pregabalin  100 mg Oral BID   rOPINIRole  2  mg Oral QHS   tamsulosin  0.4 mg Oral Daily   Continuous Infusions:   ceFAZolin (ANCEF) IV Stopped (06/26/22 2006)     LOS: 4 days    Time spent: over 30 min    Fayrene Helper, MD [facility]   To contact the attending provider between 7A-7P or the covering provider during after hours 7P-7A, please log into the web site [email or web address] and access using universal [place] password for that web site. If you do not have the password, please call the hospital operator.  06/27/2022, 7:43 PM

## 2022-06-27 NOTE — Plan of Care (Signed)
  Problem: Fluid Volume: Goal: Hemodynamic stability will improve Outcome: Progressing   Problem: Clinical Measurements: Goal: Diagnostic test results will improve Outcome: Progressing Goal: Signs and symptoms of infection will decrease Outcome: Progressing   Problem: Respiratory: Goal: Ability to maintain adequate ventilation will improve Outcome: Progressing   Problem: Education: Goal: Ability to describe self-care measures that may prevent or decrease complications (Diabetes Survival Skills Education) will improve Outcome: Progressing Goal: Individualized Educational Video(s) Outcome: Progressing   Problem: Coping: Goal: Ability to adjust to condition or change in health will improve Outcome: Progressing   Problem: Fluid Volume: Goal: Ability to maintain a balanced intake and output will improve Outcome: Progressing   Problem: Health Behavior/Discharge Planning: Goal: Ability to identify and utilize available resources and services will improve Outcome: Progressing Goal: Ability to manage health-related needs will improve Outcome: Progressing   Problem: Metabolic: Goal: Ability to maintain appropriate glucose levels will improve Outcome: Progressing   Problem: Nutritional: Goal: Maintenance of adequate nutrition will improve Outcome: Progressing Goal: Progress toward achieving an optimal weight will improve Outcome: Progressing   Problem: Skin Integrity: Goal: Risk for impaired skin integrity will decrease Outcome: Progressing   Problem: Tissue Perfusion: Goal: Adequacy of tissue perfusion will improve Outcome: Progressing   Problem: Education: Goal: Knowledge of General Education information will improve Description: Including pain rating scale, medication(s)/side effects and non-pharmacologic comfort measures Outcome: Progressing   Problem: Health Behavior/Discharge Planning: Goal: Ability to manage health-related needs will improve Outcome:  Progressing   Problem: Clinical Measurements: Goal: Ability to maintain clinical measurements within normal limits will improve Outcome: Progressing Goal: Will remain free from infection Outcome: Progressing Goal: Diagnostic test results will improve Outcome: Progressing Goal: Respiratory complications will improve Outcome: Progressing Goal: Cardiovascular complication will be avoided Outcome: Progressing   Problem: Activity: Goal: Risk for activity intolerance will decrease Outcome: Progressing   Problem: Nutrition: Goal: Adequate nutrition will be maintained Outcome: Progressing   Problem: Coping: Goal: Level of anxiety will decrease Outcome: Progressing   Problem: Elimination: Goal: Will not experience complications related to bowel motility Outcome: Progressing Goal: Will not experience complications related to urinary retention Outcome: Progressing   Problem: Pain Managment: Goal: General experience of comfort will improve Outcome: Progressing   Problem: Safety: Goal: Ability to remain free from injury will improve Outcome: Progressing   Problem: Skin Integrity: Goal: Risk for impaired skin integrity will decrease Outcome: Progressing   

## 2022-06-27 NOTE — Care Management Important Message (Signed)
Important Message  Patient Details  Name: EDI GORNIAK MRN: [id number] Date of Birth: [birth year]   Medicare Important Message Given:  Yes     Shelda Altes 06/27/2022, 11:11 AM

## 2022-06-27 NOTE — Progress Notes (Signed)
PHARMACY CONSULT NOTE FOR:  OUTPATIENT  PARENTERAL ANTIBIOTIC THERAPY (OPAT)  NOTE: This OPAT is informational only as patient will be completing abx therapy with hemodialysis. Plan has been communicated to nephrology team.   Indication: GBS bacteremia 2/2 osteomyelitis of left foot- planned BKA 07/04/22 Regimen: Cefazolin 2g IV TTS with HD  End date: 07/09/22   IV antibiotic discharge orders are pended. To discharging provider:  please sign these orders via discharge navigator,  Select New Orders & click on the button choice - Manage This Unsigned Work.     Thank you for allowing pharmacy to be a part of this patient's care.  Adria Dill, PharmD PGY-2 Infectious Diseases Resident  06/27/2022 1:39 PM

## 2022-06-27 NOTE — Inpatient Diabetes Management (Signed)
Inpatient Diabetes Program Recommendations  AACE/ADA: New Consensus Statement on Inpatient Glycemic Control (2015)  Target Ranges:  Prepandial:   less than 140 mg/dL      [place] postprandial:   less than 180 mg/dL (1-2 hours)      Critically ill patients:  140 - 180 mg/dL     Latest Reference Range & Units 06/26/22 08:07 06/26/22 12:55 06/27/22 08:07  Glucose-Capillary 70 - 99 mg/dL 259 (H) 320 (H) 242 (H)   Home DM Meds: Lantus 30 units daily       Novolog 13 units TID with meals   Current Orders: Novolog Sensitive Correction Scale/ SSI (0-9 units) TID AC     Semglee 15 units QHS   MD- Note CBG 242 this AM  -   Consider Increasing the Semglee to 20 units QHS -   Add Novolog 4 units tid meal coverage if eating>50% of meals.     --Will follow patient during hospitalization--  Tama Headings RN, MSN, BC-ADM Inpatient Diabetes Coordinator Team Pager [phone] (8a-5p)

## 2022-06-27 NOTE — Progress Notes (Signed)
Pt receives out-pt HD at [facility] on TTS. Pt arrives at 11:25 for 11:45 chair time. Attending awaiting results therefore not ready for d/c today. Contacted inpt HD unit and requested pt be placed on 2nd shift tomorrow in the event pt stable for d/c in the am and can receive treatment as out-pt. Pt's clinic confirms having iv abx in-stock that pt will need at d/c. Will assist as needed.   Melven Sartorius Renal Navigator [phone]

## 2022-06-27 NOTE — Progress Notes (Signed)
Physical Therapy Treatment Patient Details Name: [name] MRN: [id number] DOB: 11/19/1955 Today's Date: 06/27/2022   History of Present Illness 66 yo male with onset of fever and weakness was admitted 10/14, now has  dx sepsis with lactic acidosis.  Had AMS, potentially having L BKA.  Reports pain in R shoulder, R LE phantom pain, L foot pain, neck and back pain.  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 Pt declines getting to recliner, or simulating home transfers by transferring recliner<>bed, as well as STS attempts with RW. Pt is able to complete supine>sit with HOB elevated, supervision, and slightly increased time to upright trunk. In sitting, pt maintains L lateral lean on elbow & elevated HOB. Pt demonstrates good sitting balance as he is able to don L post op shoe with min assist without LOB. PT educates pt on potential need for further rehab following upcoming procedure. Pt reports need to have BM & completes bed>BSC via squat pivot with supervision & PT stabilizing BSC. Pt left on BSC as pt reports he needs extended time, call bell in reach, NT aware of pt's location.    Recommendations for follow up therapy are one component of a multi-disciplinary discharge planning process, led by the attending physician.  Recommendations may be updated based on patient status, additional functional criteria and insurance authorization.  Follow Up Recommendations  Home health PT Can patient physically be transported by private vehicle: No   Assistance Recommended at Discharge Intermittent Supervision/Assistance  Patient can return home with the following A little help with walking and/or transfers;A little help  with bathing/dressing/bathroom;Help with stairs or ramp for entrance;Assist for transportation   Equipment Recommendations  None recommended by PT (pt reports he has all DME needs)    Recommendations for Other Services       Precautions / Restrictions Precautions Precautions: Fall Precaution Comments: awaiting L BKA Restrictions Weight Bearing Restrictions: No Other Position/Activity Restrictions: has RLE prosthetic     Mobility  Bed Mobility Overal bed mobility: Needs Assistance Bed Mobility: Supine to Sit   Sidelying to sit: Supervision, HOB elevated       General bed mobility comments: extra time, reliance on HOB elevated & Bed rails    Transfers Overall transfer level: Needs assistance   Transfers: Bed to chair/wheelchair/BSC Sit to Stand: Supervision     Squat pivot transfers: Supervision     General transfer comment: pt completes bed>BSC on L with supervision (PT stabilizing BSC) with RLE prosthetic donned, L post op shoe donned; PT reiterated importance of safe positioning of BSC with pt stating "I already know this"    Ambulation/Gait                   Stairs             Wheelchair Mobility    Modified Rankin (Stroke Patients Only)       Balance Overall balance assessment: Needs assistance Sitting-balance support: Feet supported Sitting balance-Leahy Scale: Good Sitting balance - Comments: Pt elects to lean on L elbow with HOB elevated (declines PT positioning bed flat), is able to sit on [facility] without assistance. Pt attempts donning L post op shoe EOB  without LOB.                                    Cognition Arousal/Alertness: Awake/alert Behavior During Therapy: Flat affect Overall Cognitive Status: Within Functional Limits for tasks assessed                                          Exercises      General Comments        Pertinent Vitals/Pain Pain Assessment Pain Assessment: Faces Faces  Pain Scale: Hurts a little bit Pain Location: LLE Pain Descriptors / Indicators: Discomfort Pain Intervention(s): Patient requesting pain meds-RN notified    Home Living                          Prior Function            PT Goals (current goals can now be found in the care plan section) Acute Rehab PT Goals Patient Stated Goal: to go home and feel safe, reduce pain. PT Goal Formulation: With patient Time For Goal Achievement: 07/09/22 Potential to Achieve Goals: Good Progress towards PT goals: Progressing toward goals    Frequency    Min 3X/week      PT Plan Discharge plan needs to be updated (pt declining SNF, updated d/c recommendations to HHPT)    Co-evaluation              AM-PAC PT "6 Clicks" Mobility   Outcome Measure  Help needed turning from your back to your side while in a flat bed without using bedrails?: A Little Help needed moving from lying on your back to sitting on the side of a flat bed without using bedrails?: A Little Help needed moving to and from a bed to a chair (including a wheelchair)?: A Little Help needed standing up from a chair using your arms (e.g., wheelchair or bedside chair)?: Total Help needed to walk in hospital room?: Total Help needed climbing 3-5 steps with a railing? : Total 6 Click Score: 12    End of Session   Activity Tolerance:  (pt self limiting) Patient left: with call bell/phone within reach (on [facility], NT aware of position)   PT Visit Diagnosis: Unsteadiness on feet (R26.81);Muscle weakness (generalized) (M62.81);Difficulty in walking, not elsewhere classified (R26.2);Other abnormalities of gait and mobility (R26.89)     Time: 0037-0488 PT Time Calculation (min) (ACUTE ONLY): 19 min  Charges:  $Therapeutic Activity: 8-22 mins                     [name], PT, DPT 06/27/22, 9:52 AM   [name] 06/27/2022, 9:50 AM

## 2022-06-27 NOTE — Progress Notes (Signed)
[facility] Infectious Diseases Follow Up Note  Patient Identification: Patient Name: [name] MRN: [id number] [place] Date: 06/23/2022  1:28 PM Age: 66 y.o.Today's Date: 06/27/2022  Reason for Visit: Group B strep bacteremia  Principal Problem:   Severe sepsis with lactic acidosis (HCC) Active Problems:   Cellulitis of left lower extremity   CAD (dz of distal, mid and proximal RCA with implantation of 3 overlapping drug-eluting stent,)   Chronic diastolic heart failure, NYHA class 2 (HCC)   Insulin dependent type 2 diabetes mellitus ([place])   Infected diabetic left foot ulcer with chronic osteomyelitis   PAD (peripheral artery disease) ([place])   Essential hypertension   ESRD on hemodialysis (HCC)   Chronic osteomyelitis (HCC)   Atrial fibrillation, chronic ([place])   Decubitus ulcer of buttock  Antibiotics:  Vancomycin 10/14-c Ceftriaxone 10/14-10/15, Pen G 10/15-10/16  Metonidazole 10/14-c   Lines/Hardware: RT TKA  Interval Events: Continues to remain afebrile, no leukocytosis.    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 ID engaged for Group B strep bacteremia with likely source being left foot ulcer.   10/16 repeat blood cx NG in 2 days  10/16 TTE done, read pending  Will forego TEE if TTE unremarkable given acute onset, low grade bacteremia with left foot ulcer  being the source.  Plan for left AKA 10/25  Recommendations Complete 2 weeks of cefazolin with HD from 10/16 for Group B strep bacteremia if TTE unremarkable for endocarditis ( read is pending for last 2 days, secured chatted Dr [name]) Daryll Brod with Ortho for Left BKA Call us back if repeat blood cx on 10/16 are positive or TTE with significant findings, Otherwise ID will sign off.   Rest of the management as per the primary team. Thank you for the consult. Please page with pertinent questions or concerns.  ______________________________________________________________________ Subjective patient seen and examined at the bedside.  He has his daughter's wedding this Saturday and wants to get left BKA next week No complaints otherwise  Vitals BP (!) 156/63 (BP Location: Right Arm)   Pulse 79   Temp 98 F (36.7 C) (Oral)   Resp 17   Ht '6\' 2"'$  (1.88 m)   Wt 126.3 kg   SpO2 100%   BMI 35.75 kg/m     Physical Exam Constitutional:  sitting up in the bed and having break fast, appears comfortable     Comments:   Cardiovascular:     Rate and Rhythm: Normal rate and regular rhythm.     Heart sounds:  Pulmonary:     Effort: Pulmonary effort is normal on room air     Comments:   Abdominal:     Palpations: Abdomen is soft.     Tenderness: non tender and non distended   Musculoskeletal:        General: No swelling or tenderness. RT BKA  with healed stump. Left leg is bandaged C/D/I  Skin:    Comments:   Neurological:  General: awake, alert and oriented   Psychiatric:        Mood and Affect: Mood normal.   Pertinent Microbiology Results for orders placed or performed during the hospital encounter of 06/23/22  Culture, blood (Routine x 2)     Status: None (Preliminary result)   Collection Time: 06/23/22  2:04 PM   Specimen: BLOOD  Result Value Ref Range Status   Specimen Description BLOOD RIGHT ANTECUBITAL  Final   Special Requests   Final    BOTTLES DRAWN AEROBIC ONLY  Blood Culture adequate volume   Culture   Final    NO GROWTH 4 DAYS Performed at [facility], [place] [address], [place], [place] [address]    Report Status PENDING  Incomplete  Culture, blood (Routine x 2)     Status: Abnormal   Collection Time: 06/23/22  4:23 PM   Specimen: BLOOD RIGHT ARM  Result Value Ref Range Status   Specimen Description BLOOD RIGHT ARM  Final   Special Requests   Final    BOTTLES DRAWN AEROBIC AND ANAEROBIC Blood Culture adequate volume   Culture  Setup Time   Final    GRAM POSITIVE COCCI IN CHAINS AEROBIC BOTTLE ONLY CRITICAL RESULT CALLED TO, READ BACK BY AND VERIFIED WITH: Freida Busman FCP Performed at [facility], [place] [address], [place], [place] [address]    Culture GROUP B STREP(S.AGALACTIAE)ISOLATED (A)  Final   Report Status 06/26/2022 FINAL  Final   Organism ID, Bacteria GROUP B STREP(S.AGALACTIAE)ISOLATED  Final      Susceptibility   Group b strep(s.agalactiae)isolated - MIC*    CLINDAMYCIN <=0.25 SENSITIVE Sensitive     AMPICILLIN <=0.25 SENSITIVE Sensitive     ERYTHROMYCIN <=0.12 SENSITIVE Sensitive     VANCOMYCIN 0.5 SENSITIVE Sensitive     CEFTRIAXONE <=0.12 SENSITIVE Sensitive     LEVOFLOXACIN 0.5 SENSITIVE Sensitive     PENICILLIN <=0.06 SENSITIVE Sensitive     * GROUP B STREP(S.AGALACTIAE)ISOLATED  Resp Panel by RT-PCR (Flu A&B, Covid) Anterior Nasal Swab     Status: None   Collection Time: 06/23/22  4:23 PM   Specimen: Anterior Nasal Swab  Result Value Ref Range Status   SARS Coronavirus 2 by RT PCR NEGATIVE NEGATIVE Final    Comment: (NOTE) SARS-CoV-2 target nucleic acids are NOT DETECTED.  The SARS-CoV-2 RNA is generally detectable in upper respiratory specimens during the acute phase of infection. The lowest concentration of SARS-CoV-2 viral copies this assay can detect is 138 copies/mL. A negative result does not preclude SARS-Cov-2 infection and should not be used as the sole basis for treatment  or other patient management decisions. A negative result may occur with  improper specimen collection/handling, submission of specimen other than nasopharyngeal swab, presence of viral mutation(s) within the areas targeted by this assay, and inadequate number of viral copies(<138 copies/mL). A negative result must be combined with clinical observations, patient history, and epidemiological information. The expected result is Negative.  Fact Sheet for Patients:  EntrepreneurPulse.com.au  Fact Sheet for Healthcare Providers:  IncredibleEmployment.be  This test is no t yet approved or cleared by the [place] FDA and  has been authorized for detection and/or diagnosis of SARS-CoV-2 by FDA under an Emergency Use Authorization (EUA). This EUA will remain  in effect (meaning this test can be used) for the duration of the COVID-19 declaration under Section 564(b)(1) of the Act, 21 U.S.C.section 360bbb-3(b)(1), unless the authorization is terminated  or revoked sooner.  Influenza A by PCR NEGATIVE NEGATIVE Final   Influenza B by PCR NEGATIVE NEGATIVE Final    Comment: (NOTE) The Xpert Xpress SARS-CoV-2/FLU/RSV plus assay is intended as an aid in the diagnosis of influenza from Nasopharyngeal swab specimens and should not be used as a sole basis for treatment. Nasal washings and aspirates are unacceptable for Xpert Xpress SARS-CoV-2/FLU/RSV testing.  Fact Sheet for Patients: EntrepreneurPulse.com.au  Fact Sheet for Healthcare Providers: IncredibleEmployment.be  This test is not yet approved or cleared by the [place] FDA and has been authorized for detection and/or diagnosis of SARS-CoV-2 by FDA under an Emergency Use Authorization (EUA). This EUA will remain in effect (meaning this test can be used) for the duration of the COVID-19 declaration under Section 564(b)(1) of the Act, 21 U.S.C. section  360bbb-3(b)(1), unless the authorization is terminated or revoked.  Performed at [facility], [place] [address] [place], [place] [address]   Blood Culture ID Panel (Reflexed)     Status: Abnormal   Collection Time: 06/23/22  4:23 PM  Result Value Ref Range Status   Enterococcus faecalis NOT DETECTED NOT DETECTED Final   Enterococcus Faecium NOT DETECTED NOT DETECTED Final   Listeria monocytogenes NOT DETECTED NOT DETECTED Final   Staphylococcus species NOT DETECTED NOT DETECTED Final   Staphylococcus aureus (BCID) NOT DETECTED NOT DETECTED Final   Staphylococcus epidermidis NOT DETECTED NOT DETECTED Final   Staphylococcus lugdunensis NOT DETECTED NOT DETECTED Final   Streptococcus species DETECTED (A) NOT DETECTED Final    Comment: CRITICAL RESULT CALLED TO, READ BACK BY AND VERIFIED WITH: [place] [id number] FCP    Streptococcus agalactiae DETECTED (A) NOT DETECTED Final    Comment: CRITICAL RESULT CALLED TO, READ BACK BY AND VERIFIED WITH: [place] [id number] FCP    Streptococcus pneumoniae NOT DETECTED NOT DETECTED Final   Streptococcus pyogenes NOT DETECTED NOT DETECTED Final   A.calcoaceticus-baumannii NOT DETECTED NOT DETECTED Final   Bacteroides fragilis NOT DETECTED NOT DETECTED Final   Enterobacterales NOT DETECTED NOT DETECTED Final   Enterobacter cloacae complex NOT DETECTED NOT DETECTED Final   Escherichia coli NOT DETECTED NOT DETECTED Final   Klebsiella aerogenes NOT DETECTED NOT DETECTED Final   Klebsiella oxytoca NOT DETECTED NOT DETECTED Final   Klebsiella pneumoniae NOT DETECTED NOT DETECTED Final   Proteus species NOT DETECTED NOT DETECTED Final   Salmonella species NOT DETECTED NOT DETECTED Final   Serratia marcescens NOT DETECTED NOT DETECTED Final   Haemophilus influenzae NOT DETECTED NOT DETECTED Final   Neisseria meningitidis NOT DETECTED NOT DETECTED Final   Pseudomonas aeruginosa NOT DETECTED NOT DETECTED Final   Stenotrophomonas  maltophilia NOT DETECTED NOT DETECTED Final   Candida albicans NOT DETECTED NOT DETECTED Final   Candida auris NOT DETECTED NOT DETECTED Final   Candida glabrata NOT DETECTED NOT DETECTED Final   Candida krusei NOT DETECTED NOT DETECTED Final   Candida parapsilosis NOT DETECTED NOT DETECTED Final   Candida tropicalis NOT DETECTED NOT DETECTED Final   Cryptococcus neoformans/gattii NOT DETECTED NOT DETECTED Final    Comment: Performed at [facility], [place]. [address] [place],  [address]  Culture, blood (Routine X 2) w Reflex to ID Panel     Status: None (Preliminary result)   Collection Time: 06/25/22 12:53 PM   Specimen: BLOOD  Result Value Ref Range Status   Specimen Description BLOOD RIGHT ANTECUBITAL  Final   Special Requests   Final    BOTTLES DRAWN  AEROBIC AND ANAEROBIC Blood Culture adequate volume   Culture   Final    NO GROWTH 2 DAYS Performed at [facility], [place] [address] [place], [place] [address]    Report Status PENDING  Incomplete  Culture, blood (Routine X 2) w Reflex to ID Panel     Status: None (Preliminary result)   Collection Time: 06/25/22 12:58 PM   Specimen: BLOOD RIGHT FOREARM  Result Value Ref Range Status   Specimen Description BLOOD RIGHT FOREARM  Final   Special Requests   Final    BOTTLES DRAWN AEROBIC AND ANAEROBIC Blood Culture adequate volume   Culture   Final    NO GROWTH 2 DAYS Performed at [facility], [place] [address] [place], [place] [address]    Report Status PENDING  Incomplete   Pertinent Lab.    Latest Ref Rng & Units 06/27/2022    2:03 AM 06/26/2022    3:13 PM 06/25/2022    1:57 AM  CBC  WBC 4.0 - 10.5 K/uL 4.9  6.0  9.3   Hemoglobin 13.0 - 17.0 g/dL 10.1  9.8  9.0   Hematocrit 39.0 - 52.0 % 29.2  28.6  26.3   Platelets 150 - 400 K/uL 126  137  133       Latest Ref Rng & Units 06/27/2022    2:03 AM 06/26/2022    3:13 PM 06/25/2022    1:57 AM  CMP  Glucose 70 - 99 mg/dL 351  238  223   BUN 8 - 23  mg/dL 20  35  49   Creatinine 0.61 - 1.24 mg/dL 4.07  5.93  7.28   Sodium 135 - 145 mmol/L 137  136  135   Potassium 3.5 - 5.1 mmol/L 3.5  3.5  3.6   Chloride 98 - 111 mmol/L 97  98  101   CO2 22 - 32 mmol/L '28  23  22   '$ Calcium 8.9 - 10.3 mg/dL 8.0  7.6  7.7     Pertinent Imaging today Plain films and CT images have been personally visualized and interpreted; radiology reports have been reviewed. Decision making incorporated into the Impression / Recommendations.  No results found.   I spent 55 minutes for this patient encounter including review of prior medical records, coordination of care with primary/other specialist with greater than 50% of time being face to face/counseling and discussing diagnostics/treatment plan with the patient/family.  Electronically signed by:   Rosiland Oz, MD Infectious Disease Physician [facility] for Infectious Disease Pager: [phone]

## 2022-06-27 NOTE — Progress Notes (Signed)
I reviewed the TTE, although vegetations on the mitral and aortic valve cannot be completely excluded due to mild degenerative valve disease and calcification, the images appear quite similar to prior echocardiogram but again my proceeding can now be excluded.  As patient has a known source, osteomyelitis of the foot ulcer, proceeding with amputation is the best option and only if he has recurrence of fever or sepsis would consider TEE.  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  Patient is in extreme poor health, I have previously discussed with patient and recently met his wife and discussed with her as well, low threshold to convert his CODE STATUS to palliative care, patient was on palliative care in the outpatient basis.  Please call if questions.   Adrian Prows, MD, [facility] 06/27/2022, 9:06 PM Office: [phone] Fax: [phone] Pager: [phone]

## 2022-06-27 NOTE — TOC Progression Note (Signed)
Transition of Care [facility]) - Progression Note    Patient Details  Name: [name] MRN: [id number] Date of Birth: 25-Jan-1956  [facility] CM/SW Contact  Graves-Bigelow, Ocie Cornfield, RN Phone Number: 06/27/2022, 3:19 PM  Clinical Narrative: Case Manager spoke with spouse and the plan is for the patient to return home. Patient has declined SNF at this time. Patient is currently active with [facility]. Patient will need [facility] RN orders for wound care placed in Epic for [place]. Referral submitted to Dorian Pod and she can view Epic to pull orders. Spouse had several questions regarding upcoming surgery scheduled for 07-04-22. Spouse states she discussed with patient and his first choice for disposition next admission is [facility]patient [facility], 2nd choice is [facility], 3rd choice is [facility], and 4th choice is [facility]. Patient is currently active with [facility] for Palliative Services. Case Manager did reach out to Nicholaus Corolla Liaison [facility] and she is checking on this. MD aware to place orders. No further needs identified at this time.    Expected Discharge Plan: [place] Barriers to Discharge: Continued Medical Work up  Expected Discharge Plan and Services Expected Discharge Plan: [place] In-house Referral: Clinical Social Work Discharge Planning Services: CM Consult Post Acute Care Choice: Home Health, Resumption of Svcs/PTA Provider Living arrangements for the past 2 months: Single Family Home                   DME Agency: NA       HH Arranged: RN [place] Agency: [place] Date [place]: 06/27/22 Time [place]: [place] Representative spoke with at [place]: Dorian Pod   Readmission Risk Interventions    06/30/2021   11:53 AM  Readmission Risk Prevention Plan  Transportation Screening Complete  Medication Review Press [employer])  Complete  HRI or [place] Complete  SW Recovery Care/Counseling Consult Complete  [place] Not Applicable

## 2022-06-28 DIAGNOSIS — A419 Sepsis, unspecified organism: Secondary | ICD-10-CM | POA: Diagnosis not present

## 2022-06-28 DIAGNOSIS — R6520 Severe sepsis without septic shock: Secondary | ICD-10-CM | POA: Diagnosis not present

## 2022-06-28 DIAGNOSIS — E872 Acidosis, unspecified: Secondary | ICD-10-CM | POA: Diagnosis not present

## 2022-06-28 LAB — COMPREHENSIVE METABOLIC PANEL
ALT: 8 U/L (ref 0–44)
AST: 15 U/L (ref 15–41)
Albumin: 2.3 g/dL — ABNORMAL LOW (ref 3.5–5.0)
Alkaline Phosphatase: 83 U/L (ref 38–126)
Anion gap: 14 (ref 5–15)
BUN: 29 mg/dL — ABNORMAL HIGH (ref 8–23)
CO2: 26 mmol/L (ref 22–32)
Calcium: 7.8 mg/dL — ABNORMAL LOW (ref 8.9–10.3)
Chloride: 96 mmol/L — ABNORMAL LOW (ref 98–111)
Creatinine, Ser: 5.45 mg/dL — ABNORMAL HIGH (ref 0.61–1.24)
GFR, Estimated: 11 mL/min — ABNORMAL LOW (ref >60)
Glucose, Bld: 255 mg/dL — ABNORMAL HIGH (ref 70–99)
Potassium: 3.4 mmol/L — ABNORMAL LOW (ref 3.5–5.1)
Sodium: 136 mmol/L (ref 135–145)
Total Bilirubin: 0.4 mg/dL (ref 0.3–1.2)
Total Protein: 4.9 g/dL — ABNORMAL LOW (ref 6.5–8.1)

## 2022-06-28 LAB — CBC WITH DIFFERENTIAL/PLATELET
Abs Immature Granulocytes: 0.02 10*3/uL (ref 0.00–0.07)
Basophils Absolute: 0 10*3/uL (ref 0.0–0.1)
Basophils Relative: 1 %
Eosinophils Absolute: 0.2 10*3/uL (ref 0.0–0.5)
Eosinophils Relative: 5 %
HCT: 26.2 % — ABNORMAL LOW (ref 39.0–52.0)
Hemoglobin: 8.8 g/dL — ABNORMAL LOW (ref 13.0–17.0)
Immature Granulocytes: 0 %
Lymphocytes Relative: 23 %
Lymphs Abs: 1.2 10*3/uL (ref 0.7–4.0)
MCH: 31 pg (ref 26.0–34.0)
MCHC: 33.6 g/dL (ref 30.0–36.0)
MCV: 92.3 fL (ref 80.0–100.0)
Monocytes Absolute: 0.5 10*3/uL (ref 0.1–1.0)
Monocytes Relative: 10 %
Neutro Abs: 3 10*3/uL (ref 1.7–7.7)
Neutrophils Relative %: 61 %
Platelets: 123 10*3/uL — ABNORMAL LOW (ref 150–400)
RBC: 2.84 MIL/uL — ABNORMAL LOW (ref 4.22–5.81)
RDW: 13 % (ref 11.5–15.5)
WBC: 5 10*3/uL (ref 4.0–10.5)
nRBC: 0 % (ref 0.0–0.2)

## 2022-06-28 LAB — GLUCOSE, CAPILLARY
Glucose-Capillary: 289 mg/dL — ABNORMAL HIGH (ref 70–99)
Glucose-Capillary: 312 mg/dL — ABNORMAL HIGH (ref 70–99)
Glucose-Capillary: 195 mg/dL — ABNORMAL HIGH (ref 70–99)
Glucose-Capillary: 236 mg/dL — ABNORMAL HIGH (ref 70–99)

## 2022-06-28 LAB — CULTURE, BLOOD (ROUTINE X 2)
Culture: NO GROWTH
Special Requests: ADEQUATE

## 2022-06-28 LAB — MAGNESIUM: Magnesium: 1.5 mg/dL — ABNORMAL LOW (ref 1.7–2.4)

## 2022-06-28 LAB — PHOSPHORUS: Phosphorus: 3.1 mg/dL (ref 2.5–4.6)

## 2022-06-28 MED ORDER — GENTAMICIN IV (FOR PTA / DISCHARGE USE ONLY): 100.0000 mg | INTRAVENOUS | 0 refills | Status: AC

## 2022-06-28 MED ORDER — INSULIN GLARGINE 100 UNIT/ML ~~LOC~~ SOLN: 20.0000 [IU] | Freq: Every day | SUBCUTANEOUS | 0 refills | Status: DC

## 2022-06-28 MED ORDER — CEFAZOLIN IV (FOR PTA / DISCHARGE USE ONLY): 2.0000 g | INTRAVENOUS | 0 refills | Status: AC

## 2022-06-28 MED ORDER — GENTAMICIN SULFATE 40 MG/ML IJ SOLN
140.0000 mg | INTRAVENOUS | Status: AC
  Administered 2022-06-28: 140 mg via INTRAVENOUS
  Filled 2022-06-28: qty 3.5

## 2022-06-28 MED ORDER — NOVOLOG FLEXPEN 100 UNIT/ML ~~LOC~~ SOPN: 4.0000 [IU] | PEN_INJECTOR | Freq: Three times a day (TID) | SUBCUTANEOUS | 11 refills | Status: DC

## 2022-06-28 MED ORDER — GENTAMICIN IN SALINE 1-0.9 MG/ML-% IV SOLN: 100.0000 mg | INTRAVENOUS | Status: DC

## 2022-06-28 MED ORDER — DARBEPOETIN ALFA 100 MCG/0.5ML IJ SOSY
100.0000 ug | PREFILLED_SYRINGE | INTRAMUSCULAR | Status: DC
  Administered 2022-06-28: 100 ug via INTRAVENOUS
  Filled 2022-06-28 (×2): qty 0.5

## 2022-06-28 NOTE — Progress Notes (Signed)
Pt is for possible d/c today and due HD today. Pt does not have transportation to HD clinic this afternoon (pt uses w/c transportation and w/c is at home). Pt already has an appt at clinic for treatment tomorrow since he will miss Saturday treatment due to pt's daughter wedding. Pt will have to receive inpt HD today.   Melven Sartorius Renal Navigator  [phone]

## 2022-06-28 NOTE — Progress Notes (Signed)
[name] Progress Note   Subjective:   Patient seen and examined at bedside.  Eating breakfast.  Denies CP, SOB, abdominal pain and n/v/d.  Possible vegetations on mitral and aortic valve per cardiology.   Objective Vitals:   06/27/22 1559 06/27/22 1935 06/28/22 0552 06/28/22 0811  BP: (!) 148/49 (!) 143/59 (!) 178/77 (!) 172/70  Pulse: 83 91 75 78  Resp: '16 18  18  '$ Temp: 98.6 F (37 C) 99.1 F (37.3 C) 98.4 F (36.9 C)   TempSrc: Oral Oral Oral   SpO2: 98%  99% 99%  Weight:      Height:       Physical Exam General:WDWN male in NAD Heart:RRR, no mrg Lungs:mostly CTAB, faint crackles in RLL, nml WOB on RA Abdomen:soft, NTND Extremities:trace RLE edema, foot dressed; no edema on L, +L BKA Dialysis Access: LU AVF +b/t   Filed Weights   06/25/22 1520 06/25/22 1900 06/26/22 1457  Weight: 131.1 kg 128.5 kg 126.3 kg    Intake/Output Summary (Last 24 hours) at 06/28/2022 0840 Last data filed at 06/28/2022 0603 Gross per 24 hour  Intake 240 ml  Output 300 ml  Net -60 ml    Additional Objective Labs: Basic Metabolic Panel: Recent Labs  Lab 06/26/22 1513 06/27/22 0203 06/28/22 0145  NA 136 137 136  K 3.5 3.5 3.4*  CL 98 97* 96*  CO2 '23 28 26  '$ GLUCOSE 238* 351* 255*  BUN 35* 20 29*  CREATININE 5.93* 4.07* 5.45*  CALCIUM 7.6* 8.0* 7.8*  PHOS 3.2 2.8 3.1   Liver Function Tests: Recent Labs  Lab 06/23/22 1404 06/24/22 0103 06/25/22 0157 06/26/22 1513 06/27/22 0203 06/28/22 0145  AST 20 23  --   --   --  15  ALT 16 14  --   --   --  8  ALKPHOS 133* 94  --   --   --  83  BILITOT 0.9 0.9  --   --   --  0.4  PROT 6.1* 5.0*  --   --   --  4.9*  ALBUMIN 3.1* 2.4*   < > 2.4* 2.4* 2.3*   < > = values in this interval not displayed.    CBC: Recent Labs  Lab 06/23/22 1404 06/23/22 1646 06/24/22 0103 06/25/22 0157 06/26/22 1513 06/27/22 0203 06/28/22 0145  WBC 13.5*  --  13.4* 9.3 6.0 4.9 5.0  NEUTROABS 12.2*  --   --   --   --   --   3.0  HGB 11.3*   < > 9.5* 9.0* 9.8* 10.1* 8.8*  HCT 33.0*   < > 28.7* 26.3* 28.6* 29.2* 26.2*  MCV 93.0  --  94.7 92.0 92.0 90.4 92.3  PLT 141*  --  134* 133* 137* 126* 123*   < > = values in this interval not displayed.   Blood Culture    Component Value Date/Time   SDES BLOOD RIGHT FOREARM 06/25/2022 1258   SPECREQUEST  06/25/2022 1258    BOTTLES DRAWN AEROBIC AND ANAEROBIC Blood Culture adequate volume   CULT  06/25/2022 1258    NO GROWTH 3 DAYS Performed at [facility], [place] [address], [place], [place] [address]    REPTSTATUS PENDING 06/25/2022 1258    CBG: Recent Labs  Lab 06/26/22 1255 06/27/22 0807 06/27/22 1131 06/27/22 1557 06/27/22 2111  GLUCAP 320* 242* 289* 289* 312*    Medications:   ceFAZolin (ANCEF) IV Stopped (06/26/22 2006)    (  feeding supplement) PROSource Plus  30 mL Oral BID BM   amiodarone  100 mg Oral Daily   apixaban  5 mg Oral BID   atorvastatin  40 mg Oral QHS   Chlorhexidine Gluconate Cloth  6 each Topical Q0600   famotidine  20 mg Oral Daily   fentaNYL  1 patch Transdermal K35W   folic acid  1 mg Oral Daily   [name]'s butt cream   Topical TID   insulin aspart  0-9 Units Subcutaneous TID WC   insulin aspart  4 Units Subcutaneous TID WC   insulin glargine-yfgn  20 Units Subcutaneous QHS   loratadine  10 mg Oral Daily   metoCLOPramide  5 mg Oral TID AC   midodrine  10 mg Oral Q T,Th,Sa-HD   nitroGLYCERIN  0.2 mg Transdermal Daily   nutrition supplement (JUVEN)  1 packet Oral BID BM   pantoprazole  40 mg Oral Daily   pregabalin  100 mg Oral BID   rOPINIRole  2 mg Oral QHS   tamsulosin  0.4 mg Oral Daily    Dialysis Orders: [facility] 4h 58mn  450/800   112kg   2/2 bath  Hep none  LUA AVF - last HD 10/12, post wt 120kg - comes off 5-8kg over usually - mircera 30 mg q 4 wks, last 10/7 - doxercalciferol 2 ug tiw IV tts       BP 120- 160 / 60- 75,  HR 68- 98, RR 14- 23  tmax 103    Na 139  K 4 .2  BUN 35  Cr 6.1       CXR  10/14 - IMPRESSION: No active cardiopulmonary disease.     Assessment/ Plan: Sepsis w/Bacteremia - w/ temp 103 on admit, high HR/ RR, L leg erythema c/w cellulitis. Now afebrile. Blood cultures + Group B strep. ABX narrowed to PEN G per pharm.  TTE with degenerative valve disease and unable to completely exclude vegetations, plan to treat as IE and give ABX for 4-6wks.   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 S/p 6 wks IV abx per prior dc summary. Plan for BKA next week. ESRD - on HD TTS. HD today per regular schedule.  PAD - sp R BKA, L ray amp CAD h/o PCI PAF - in NSR here, per pmd  HTN/ vol  - takes amlodipine at home - currently held, and pre HD midodrine. BP variable, if consistently elevated, restart amlodipine. Does not get close to his dry wt at OP HD, coming off 5-8 kg over. Large gains. LE edema but no respiratory issues.  Volume status improving, continue to titrate down volume as tolerated. Unsure of accuracy of bed weights.  Anemia esrd - Hgb 8.8. ESA dosed q4wks, change to q2wks with drop in Hgb, will dose today.  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 Nutrition - Renal Diet w/fluid restrictions. Alb 2.4 - give protein supplements.   LJen Mow PA-C [name] 06/28/2022,8:40 AM  LOS: 5 days

## 2022-06-28 NOTE — Progress Notes (Signed)
Occupational Therapy Treatment Patient Details Name: [name] MRN: [id number] DOB: 19-Jan-1956 Today's Date: 06/28/2022   History of present illness 66 yo male with onset of fever and weakness was admitted 10/14, now has  dx sepsis with lactic acidosis.  Had AMS, potentially having L BKA.  Reports pain in R shoulder, R LE phantom pain, L foot pain, neck and back pain.  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 Patient required increased time and supervision to get to EOB. Patient required increased time between stands and required min assist to power up to RW. Patient tolerated 2+ minutes of standing for each stand and min guard assist. Patient returned to supine with supervision and increased time. Acute OT to continue to follow.    Recommendations for follow up therapy are one component of a multi-disciplinary discharge planning process, led by the attending physician.  Recommendations may be updated based on patient status, additional functional criteria and insurance authorization.    Follow Up Recommendations  Other (comment) (TBD after surgery)    Assistance Recommended at Discharge Intermittent Supervision/Assistance  Patient can return home with the following  A little help with walking and/or transfers;A little help with bathing/dressing/bathroom;Assistance with cooking/housework;Direct supervision/assist for financial management;Direct supervision/assist for medications management;Assist for transportation;Help with stairs or ramp for entrance   Equipment Recommendations  Other (comment) (TBD)    Recommendations for Other Services      Precautions / Restrictions  Precautions Precautions: Fall Precaution Comments: awaiting L BKA Restrictions Weight Bearing Restrictions: No Other Position/Activity Restrictions: has RLE prosthetic       Mobility Bed Mobility Overal bed mobility: Needs Assistance Bed Mobility: Supine to Sit, Sit to Supine     Supine to sit: Supervision, HOB elevated Sit to supine: Supervision, HOB elevated   General bed mobility comments: increased time and assistance for lines    Transfers Overall transfer level: Needs assistance Equipment used: Rolling walker (2 wheels) Transfers: Sit to/from Stand Sit to Stand: Min assist           General transfer comment: min assist to power up from EOB     Balance Overall balance assessment: Needs assistance Sitting-balance support: Feet supported Sitting balance-Leahy Scale: Good     Standing balance support: During functional activity, Bilateral upper extremity supported Standing balance-Leahy Scale: Poor Standing balance comment: min guard and patient reliant on RW for support                           ADL either performed or assessed with clinical judgement   ADL Overall ADL's : Needs assistance/impaired     Grooming: Set up;Sitting                                 General ADL Comments: light grooming seated on EOB. Focused on sit to stands from [facility]    Extremity/Trunk Assessment              Vision       Perception     Praxis      Cognition Arousal/Alertness: Awake/alert Behavior During Therapy: Flat affect Overall Cognitive  Status: Within Functional Limits for tasks assessed                                          Exercises      Shoulder Instructions       General Comments      Pertinent Vitals/ Pain       Pain Assessment Pain Assessment: Faces Faces Pain Scale: Hurts little more Pain Location: LLE Pain Descriptors / Indicators: Discomfort Pain Intervention(s): Limited activity within  patient's tolerance, Monitored during session, Repositioned  Home Living                                          Prior Functioning/Environment              Frequency  Min 2X/week        Progress Toward Goals  OT Goals(current goals can now be found in the care plan section)  Progress towards OT goals: Progressing toward goals  Acute Rehab OT Goals Patient Stated Goal: get better OT Goal Formulation: With patient Time For Goal Achievement: 07/09/22 Potential to Achieve Goals: Good ADL Goals Pt Will Perform Upper Body Dressing: with supervision;sitting Pt Will Perform Lower Body Dressing: with supervision;sitting/lateral leans;bed level Pt Will Transfer to Toilet: with supervision;bedside commode;with transfer board Additional ADL Goal #1: pt will complete bed mobility with supervision in prep for ADLs  Plan Discharge plan remains appropriate    Co-evaluation                 AM-PAC OT "6 Clicks" Daily Activity     Outcome Measure   Help from another person eating meals?: None Help from another person taking care of personal grooming?: A Little Help from another person toileting, which includes using toliet, bedpan, or urinal?: A Lot Help from another person bathing (including washing, rinsing, drying)?: A Lot Help from another person to put on and taking off regular upper body clothing?: A Little Help from another person to put on and taking off regular lower body clothing?: A Lot 6 Click Score: 16    End of Session Equipment Utilized During Treatment: Gait belt;Rolling walker (2 wheels)  OT Visit Diagnosis: Unsteadiness on feet (R26.81);Other abnormalities of gait and mobility (R26.89);Muscle weakness (generalized) (M62.81)   Activity Tolerance Patient tolerated treatment well   Patient Left in chair;with call bell/phone within reach   Nurse Communication Mobility status        Time: 2094-7096 OT Time Calculation (min): 30  min  Charges: OT General Charges $OT Visit: 1 Visit OT Treatments $Therapeutic Activity: 23-37 mins  Lodema Hong, [place]  Office [place] 06/28/2022, 10:59 AM

## 2022-06-28 NOTE — Plan of Care (Signed)
  Problem: Fluid Volume: Goal: Hemodynamic stability will improve Outcome: Progressing   Problem: Clinical Measurements: Goal: Diagnostic test results will improve Outcome: Progressing Goal: Signs and symptoms of infection will decrease Outcome: Progressing   Problem: Respiratory: Goal: Ability to maintain adequate ventilation will improve Outcome: Progressing   Problem: Education: Goal: Ability to describe self-care measures that may prevent or decrease complications (Diabetes Survival Skills Education) will improve Outcome: Progressing Goal: Individualized Educational Video(s) Outcome: Progressing   Problem: Coping: Goal: Ability to adjust to condition or change in health will improve Outcome: Progressing   Problem: Fluid Volume: Goal: Ability to maintain a balanced intake and output will improve Outcome: Progressing   Problem: Health Behavior/Discharge Planning: Goal: Ability to identify and utilize available resources and services will improve Outcome: Progressing Goal: Ability to manage health-related needs will improve Outcome: Progressing   Problem: Metabolic: Goal: Ability to maintain appropriate glucose levels will improve Outcome: Progressing   Problem: Nutritional: Goal: Maintenance of adequate nutrition will improve Outcome: Progressing Goal: Progress toward achieving an optimal weight will improve Outcome: Progressing   Problem: Skin Integrity: Goal: Risk for impaired skin integrity will decrease Outcome: Progressing   Problem: Tissue Perfusion: Goal: Adequacy of tissue perfusion will improve Outcome: Progressing   Problem: Education: Goal: Knowledge of General Education information will improve Description: Including pain rating scale, medication(s)/side effects and non-pharmacologic comfort measures Outcome: Progressing   Problem: Health Behavior/Discharge Planning: Goal: Ability to manage health-related needs will improve Outcome:  Progressing   Problem: Clinical Measurements: Goal: Ability to maintain clinical measurements within normal limits will improve Outcome: Progressing Goal: Will remain free from infection Outcome: Progressing Goal: Diagnostic test results will improve Outcome: Progressing Goal: Respiratory complications will improve Outcome: Progressing Goal: Cardiovascular complication will be avoided Outcome: Progressing   Problem: Activity: Goal: Risk for activity intolerance will decrease Outcome: Progressing   Problem: Nutrition: Goal: Adequate nutrition will be maintained Outcome: Progressing   Problem: Coping: Goal: Level of anxiety will decrease Outcome: Progressing   Problem: Elimination: Goal: Will not experience complications related to bowel motility Outcome: Progressing Goal: Will not experience complications related to urinary retention Outcome: Progressing   Problem: Pain Managment: Goal: General experience of comfort will improve Outcome: Progressing   Problem: Safety: Goal: Ability to remain free from injury will improve Outcome: Progressing   Problem: Skin Integrity: Goal: Risk for impaired skin integrity will decrease Outcome: Progressing   

## 2022-06-28 NOTE — Progress Notes (Signed)
PHARMACY CONSULT NOTE FOR:  OUTPATIENT  PARENTERAL ANTIBIOTIC THERAPY (OPAT)  NOTE: This OPAT is informational only as patient will be completing abx therapy with hemodialysis. Plan has been communicated to nephrology team.   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 To discharging provider:  please sign these orders via discharge navigator,  Select New Orders & click on the button choice - Manage This Unsigned Work.     Thank you for allowing pharmacy to be a part of this patient's care.  Alycia Rossetti, PharmD, BCPS Infectious Diseases Clinical Pharmacist 06/28/2022 12:05 PM   **Pharmacist phone directory can now be found on [email or web address] ([email or web address]).  Listed under [place].

## 2022-06-28 NOTE — Procedures (Signed)
HD Note:  Some information was entered later than the data was gathered due to patient care needs. The stated time with the data is accurate.   Received patient in bed to unit.  Alert and oriented.  Informed consent signed and in chart.  Patient complained of pain in left leg and right stump.  The left leg was wrapped and unable to be assessed.  The patient was wearing his prosthesis during the treatment. Medication given, see MAR  Patient tolerated treatment well.  Received ordered medications, see MAR  Alert, without acute distress.    Access used: Left AVF Access issues: None   [facility]

## 2022-06-28 NOTE — Discharge Summary (Signed)
Physician Discharge Summary  [name] WCH:[id number] DOB: 09-Feb-1956 DOA: 06/23/2022  PCP: [name], PA-C  Admit date: 06/23/2022 Discharge date: 06/28/2022  Time spent: 40 minutes  Recommendations for Outpatient Follow-up:  Follow outpatient CBC/CMP  Follow with [name] for L BKA outpatient Continue abx as prescribed by ID Follow insulin regimen outpatient   Discharge Diagnoses:  Principal Problem:   Severe sepsis with lactic acidosis ([place]) Active Problems:   Infected diabetic left foot ulcer with chronic osteomyelitis   Essential hypertension   ESRD on hemodialysis ([place])   Decubitus ulcer of buttock   CAD (dz of distal, mid and proximal RCA with implantation of 3 overlapping drug-eluting stent,)   Chronic diastolic heart failure, NYHA class 2 (HCC)   Insulin dependent type 2 diabetes mellitus ([place])   PAD (peripheral artery disease) ([place])   Atrial fibrillation, chronic ([place])   Cellulitis of left lower extremity   Chronic osteomyelitis [facility])   Discharge Condition: stable  Diet recommendation: heart healthy   Filed Weights   06/25/22 1520 06/25/22 1900 06/26/22 1457  Weight: 131.1 kg 128.5 kg 126.3 kg    History of present illness:  Patient is [name] 66 y.o.  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  See below for further details.   Currently he's stable for discharge.  Plan is for discharge on IV abx with dialysis.  Given abnormal echo, decision has been made to treat presumptively for endocarditis.  He's returning on 10/25 for L BKA.    See below for additional details  Hospital Course:  Assessment and Plan: Severe sepsis due to group B strep bacteremia   Osteomyelitis  Sepsis physiology resolved Left foot likely the source of bacteremia Repeat blood culture on 10/16 negative so far Echo with EF 55-60%, grade II diastolic dysfunction, findings c/w hypertensive heart disease, moderate AV and MV calcification (vegetation can't be completely excluded) - [name] recommending treat as infective endocarditis with 4-6 weeks abx. On IV Ancef Has agreed to proceed with left BKA but wants to postpone till next week as daughter getting married this weekend. ID recommending 4 weeks cefazolin and 2 weeks gentamicin based on echo/cards recs   Left diabetic foot Chronic left lateral foot ulcer with underlying chronic osteomyelitis See above-Per prior discharge summary-refused BKA when offered.  Has completed 6 weeks of IV antibiotics per prior discharge summary. Has finally consented to left BKA but wants to postpone till next week (see [name] note from 06/26/2022) [name] following.   Acute metabolic encephalopathy Due to severe sepsis/bacteremia  Resolved-   PAD S/p left ray amputation, right BKA Has had multiple PCI's to left lower extremity in 2022. Continue statin   CAD-s/p PCI to RCA 2016 Denies any chest pain Continue statin   PAF Maintaining sinus rhythm Continue amiodarone Continue Eliquis   ESRD on HD TTS Missed HD on 10/14 Volume status/electrolytes stable Nephrology following and directing HD care.   Chronic HFpEF Euvolemic Volume removal with HD   BPH Continue Flomax   DM-2 (A1c 7.6 on 5/4) Doses lowered to 20 units basal, 4 units humalog with meals - continue to follow and adjust outpatient as needed   Chronic pain syndrome Peripheral neuropathy RLS Continue Requip/Lyrica/fentanyl  patch and as needed oxycodone. Watch for sedation.   Debility/deconditioning PT/OT eval   Obesity Body mass index is 35.75 kg/m.      Procedures:  Echo IMPRESSIONS     1. Left ventricular ejection fraction, by  estimation, is 55 to 60%. Left  ventricular ejection fraction by PLAX is 63 %. The left ventricle has  normal function. The left ventricle has no regional wall motion  abnormalities. There is mild left ventricular  hypertrophy. Left ventricular diastolic parameters are consistent with  Grade II diastolic dysfunction (pseudonormalization). Elevated left  ventricular end-diastolic pressure.   2. Right ventricular systolic function is normal. The right ventricular  size is normal.   3. Left atrial size was mildly dilated.   4. The mitral valve is degenerative. Trivial mitral valve regurgitation.  No evidence of mitral stenosis.   5. The aortic valve is tricuspid. There is mild calcification of the  aortic valve. There is mild thickening of the aortic valve. Aortic valve  regurgitation is not visualized. Mild aortic valve stenosis.   Comparison(s): No significant change from 06/17/2021, valvular  calcification was noted previously. Clinical correlation recommended.   Conclusion(s)/Recommendation(s): Findings consistent with hypertensive  heart disease. Findings concerning for Moderate AV and MV calcification,  vegetations cannot be completely excluded. There appearas to be mobile  mass on both leaflets, most probably  related to calcification and reverbation, how ever vegetations cannot be  excluded, would recommend Transesophageal Echocardiogram for  clarification.   Consultations: Renal Cardiology Infectious disease orthopedics  Discharge Exam: Vitals:   06/28/22 1600 06/28/22 1630  BP: 133/64 114/60  Pulse: 70 66  Resp: 15 14  Temp:    SpO2: 99% 98%   Eager to discharge after dialysis  General: No acute distress. Cardiovascular: RRR Lungs: unlabored Abdomen: Soft, nontender, nondistended Neurological: Alert and oriented 3. Moves all extremities 4 with equal strength. Cranial nerves II through XII grossly intact. Extremities: L foot with intact dressing, in  boot  Discharge Instructions   Discharge Instructions     Call MD for:  difficulty breathing, headache or visual disturbances   Complete by: As directed    Call MD for:  extreme fatigue   Complete by: As directed    Call MD for:  hives   Complete by: As directed    Call MD for:  persistant dizziness or light-headedness   Complete by: As directed    Call MD for:  persistant nausea and vomiting   Complete by: As directed    Call MD for:  redness, tenderness, or signs of infection (pain, swelling, redness, odor or green/yellow discharge around incision site)   Complete by: As directed    Call MD for:  severe uncontrolled pain   Complete by: As directed    Call MD for:  temperature >100.4   Complete by: As directed    Diet - low sodium heart healthy   Complete by: As directed    Discharge instructions   Complete by: As directed    You were seen for osteomyelitis (bone infection) and bacteria in your blood.  [name] recommended an amputation.  It sounds like this has been planned for next week on October 25th.  Please follow up for this as scheduled.   You have echo findings that looked consistent with your prior ultrasounds, but vegetations could not be excluded.  Based on our conversations with cardiology and infectious disease, we're going to treat empirically to cover endocarditis as well.  Your insulin regimen  was reduced here in the hospital.  Continue with 20 units lantus and 4 units humalog with meals.  This will need further adjustment outpatient.  You had lymph nodes that were abnormal, likely related to your lower extremity infection.  This should be followed outpatient.   Return for new, recurrent, or worsening symptoms.  Please ask your PCP to request records from this hospitalization so they know what was done and what the next steps will be.   Discharge wound care:   Complete by: As directed    Wound care to the left lateral foot:  Cleanse with soap and water,  rinse and dry. Cover wound with size appropriate piece of silver hydrofiber (Aquacel Ag+ Advantage, Lawson # [id number]), top with dry gauze and secure with silicone foam dressing. If patient has his compression stocking, please apply compression stocking. If no stocking, wrap from just below toes to just below knee with Kerlix roll gauze and top with ACE bandage applied in Rolinda Impson similar manner. Change M/W/F.   Home infusion instructions   Complete by: As directed    Instructions: Flushing of vascular access device: 0.9% NaCl pre/post medication administration and prn patency; Heparin 100 u/ml, 95m for implanted ports and Heparin 10u/ml, 551mfor all other central venous catheters.   Increase activity slowly   Complete by: As directed       Allergies as of 06/28/2022       Reactions   Contrast Media [iodinated Contrast Media] Shortness Of Breath, Other (See Comments)   Difficulty breathing and altered mental status   Ivp Dye [iodinated Contrast Media] Anaphylaxis, Shortness Of Breath, Other (See Comments)   Breathing problems, altered mental state   Adhesive [tape] Rash, Other (See Comments)   Rash after 1 day of use   Latex Rash, Other (See Comments)   Kasson Lamere severe rash appears after the first 24 hours of being placed        Medication List     STOP taking these medications    doxycycline 100 MG tablet Commonly known as: VIBRA-TABS   HumaLOG KwikPen 100 UNIT/ML KwikPen Generic drug: insulin lispro       TAKE these medications    amiodarone 200 MG tablet Commonly known as: PACERONE Take 0.5 tablets (100 mg total) by mouth daily.   amLODipine 5 MG tablet Commonly known as: NORVASC Take 5 mg by mouth daily as needed for dizziness. Take one tablet for BP > 160 mm Hg   apixaban 5 MG Tabs tablet Commonly known as: ELIQUIS Take 1 tablet (5 mg total) by mouth 2 (two) times daily.   atorvastatin 40 MG tablet Commonly known as: LIPITOR Take 40 mg by mouth at bedtime.   ceFAZolin   IVPB Commonly known as: ANCEF Inject 2 g into the vein Every Tuesday,Thursday,and Saturday with dialysis for 25 days. Indication:  GBS bacteremia  First Dose: Yes Last Day of Therapy:  07/23/22 Labs - Once weekly:  CBC/D and BMP, Labs - Every other week:  ESR and CRP Method of administration: IV Push Method of administration may be changed at the discretion of home infusion pharmacist based upon assessment of the patient and/or caregiver's ability to self-administer the medication ordered.   Dialyvite/Zinc Tabs Take 1 tablet by mouth daily.   famotidine 20 MG tablet Commonly known as: PEPCID Take 20 mg by mouth daily.   fentaNYL 50 MCG/HR Commonly known as: DUPercival patch onto the skin every 3 (three) days.   folic acid 1 MG  tablet Commonly known as: FOLVITE Take 1 mg by mouth daily.   gentamicin  IVPB Commonly known as: GARAMYCIN Inject 100 mg into the vein Every Tuesday,Thursday,and Saturday with dialysis for 14 days. Indication:  GBS AV/MV IE - synergy Last Day of Therapy:  07/12/22 Labs - Sunday/Monday:  CBC/D, BMP, and gentamicin trough. Labs - Thursday:  BMP and gentamicin trough Labs - Every other week:  ESR and CRP Method of administration: Elastomeric Method of administration may be changed at the discretion of home infusion pharmacist based upon assessment of the patient and/or caregiver's ability to self-administer the medication ordered.   GLUCOSAMINE PO Take 1 tablet by mouth 2 (two) times daily.   insulin glargine 100 UNIT/ML injection Commonly known as: LANTUS Inject 0.2 mLs (20 Units total) into the skin at bedtime. What changed: how much to take   ketoconazole 2 % cream Commonly known as: NIZORAL Apply 1 application topically 2 (two) times daily as needed for irritation.   lidocaine-prilocaine cream Commonly known as: EMLA Apply 1 application topically See admin instructions. Prior to Dialysis days Tuesday,Thursday and saturday   linaclotide  145 MCG Caps capsule Commonly known as: LINZESS Take 145 mcg by mouth daily as needed (constipation).   loratadine 10 MG tablet Commonly known as: CLARITIN Take 10 mg by mouth daily.   metoCLOPramide 5 MG tablet Commonly known as: Reglan Take 1 tablet (5 mg total) by mouth 4 (four) times daily -  before meals and at bedtime. What changed: when to take this   midodrine 10 MG tablet Commonly known as: PROAMATINE Take 1 tablet (10 mg total) by mouth every other day. One hour before dialysis   NEEDLE (REUSABLE) 22 G 22G X 1-1/2" Misc 1 Units by Does not apply route as directed. For B12 IM inj   nitroGLYCERIN 0.4 MG SL tablet Commonly known as: NITROSTAT Place 0.4 mg under the tongue every 5 (five) minutes as needed for chest pain.   nitroGLYCERIN 0.2 mg/hr patch Commonly known as: NITRODUR - Dosed in mg/24 hr Place 1 patch (0.2 mg total) onto the skin daily.   NovoLOG FlexPen 100 UNIT/ML FlexPen Generic drug: insulin aspart Inject 4 Units into the skin 3 (three) times daily. What changed: how much to take   omega-3 acid ethyl esters 1 g capsule Commonly known as: LOVAZA Take 2 g by mouth 2 (two) times daily.   ondansetron 4 MG disintegrating tablet Commonly known as: ZOFRAN-ODT Take 1 tablet by mouth 4 (four) times daily as needed for nausea/vomiting.   oxyCODONE-acetaminophen 7.5-325 MG tablet Commonly known as: PERCOCET Take 1 tablet by mouth 3 (three) times daily as needed for moderate pain. What changed: when to take this   pantoprazole 40 MG tablet Commonly known as: PROTONIX Take 1 tablet (40 mg total) by mouth 2 (two) times daily for 30 days, THEN 1 tablet (40 mg total) daily. Start taking on: Jan 14, 2022   Polyvinyl Alcohol-Povidone PF 1.4-0.6 % Soln Place 1 drop into both eyes 2 (two) times daily as needed (dry eyes).   pregabalin 100 MG capsule Commonly known as: LYRICA Take 100 mg by mouth 2 (two) times daily.   rOPINIRole 2 MG tablet Commonly known  as: REQUIP Take 2 mg by mouth at bedtime.   sevelamer carbonate 800 MG tablet Commonly known as: RENVELA Take 800 mg by mouth 3 (three) times daily.   silver sulfADIAZINE 1 % cream Commonly known as: SILVADENE Apply topically daily.   SYRINGE 3CC/22GX1-1/2" 22G X  1-1/2" 3 ML Misc 1 Syringe by Does not apply route as directed. For b12 IM inj   tamsulosin 0.4 MG Caps capsule Commonly known as: FLOMAX Take 0.4 mg by mouth daily.               Home Infusion Instuctions  (From admission, onward)           Start     Ordered   06/28/22 0000  Home infusion instructions       Question:  Instructions  Answer:  Flushing of vascular access device: 0.9% NaCl pre/post medication administration and prn patency; Heparin 100 u/ml, 71m for implanted ports and Heparin 10u/ml, 53mfor all other central venous catheters.   06/28/22 1231              Discharge Care Instructions  (From admission, onward)           Start     Ordered   06/28/22 0000  Discharge wound care:       Comments: Wound care to the left lateral foot:  Cleanse with soap and water, rinse and dry. Cover wound with size appropriate piece of silver hydrofiber (Aquacel Ag+ Advantage, Lawson # [id number] top with dry gauze and secure with silicone foam dressing. If patient has his compression stocking, please apply compression stocking. If no stocking, wrap from just below toes to just below knee with Kerlix roll gauze and top with ACE bandage applied in Shelby Peltz similar manner. Change M/W/F.   06/28/22 1645           Allergies  Allergen Reactions   Contrast Media [Iodinated Contrast Media] Shortness Of Breath and Other (See Comments)    Difficulty breathing and altered mental status     Ivp Dye [Iodinated Contrast Media] Anaphylaxis, Shortness Of Breath and Other (See Comments)    Breathing problems, altered mental state    Adhesive [Tape] Rash and Other (See Comments)    Rash after 1 day of use   Latex Rash  and Other (See Comments)    Sandro Burgo severe rash appears after the first 24 hours of being placed    [facility]   Specialty: Home Health Services Why: Registered Nurse: office to call with visit times. Contact information: [phone]         [facility] Follow up.   Specialty: PALLIATIVE CARE Why: Palliative-office to call with visit times. Contact information: [phone]               The results of significant diagnostics from this hospitalization (including imaging, microbiology, ancillary and laboratory) are listed below for reference.    Significant Diagnostic Studies: ECHOCARDIOGRAM COMPLETE  Result Date: 06/27/2022    ECHOCARDIOGRAM REPORT   Patient Name:   JOMARKE GOODWYNate of Exam: 06/25/2022 Medical Rec #:  [id number]   Height:       74.0 in Accession #:    [id number]  Weight:       277.1 lb Date of Birth:  05/1956/08/28   BSA:          2.497 m Patient Age:    [age]      BP:           131/59 mmHg Patient Gender: M             HR:  74 bpm. Exam Location:  Inpatient Procedure: 2D Echo, Cardiac Doppler, Color Doppler and Intracardiac            Opacification Agent Indications:     Bacteremia  History:         Patient has prior history of Echocardiogram examinations. CAD,                  PAD; Risk Factors:Hypertension and Diabetes.  Sonographer:     Meagan Baucom RDCS, FE, PE Referring Phys:  [place] Diagnosing Phys: Adrian Prows MD IMPRESSIONS  1. Left ventricular ejection fraction, by estimation, is 55 to 60%. Left ventricular ejection fraction by PLAX is 63 %. The left ventricle has normal function. The left ventricle has no regional wall motion abnormalities. There is mild left ventricular hypertrophy. Left ventricular diastolic parameters are consistent with Grade II diastolic dysfunction  (pseudonormalization). Elevated left ventricular end-diastolic pressure.  2. Right ventricular systolic function is normal. The right ventricular size is normal.  3. Left atrial size was mildly dilated.  4. The mitral valve is degenerative. Trivial mitral valve regurgitation. No evidence of mitral stenosis.  5. The aortic valve is tricuspid. There is mild calcification of the aortic valve. There is mild thickening of the aortic valve. Aortic valve regurgitation is not visualized. Mild aortic valve stenosis. Comparison(s): No significant change from 06/17/2021, valvular calcification was noted previously. Clinical correlation recommended. Conclusion(s)/Recommendation(s): Findings consistent with hypertensive heart disease. Findings concerning for Moderate AV and MV calcification, vegetations cannot be completely excluded. There appearas to be mobile mass on both leaflets, most probably related to calcification and reverbation, how ever vegetations cannot be excluded, would recommend Transesophageal Echocardiogram for clarification. FINDINGS  Left Ventricle: Left ventricular ejection fraction, by estimation, is 55 to 60%. Left ventricular ejection fraction by PLAX is 63 %. The left ventricle has normal function. The left ventricle has no regional wall motion abnormalities. Definity contrast agent was given IV to delineate the left ventricular endocardial borders. The left ventricular internal cavity size was normal in size. There is mild left ventricular hypertrophy. Left ventricular diastolic parameters are consistent with Grade II diastolic dysfunction (pseudonormalization). Elevated left ventricular end-diastolic pressure. Right Ventricle: The right ventricular size is normal. No increase in right ventricular wall thickness. Right ventricular systolic function is normal. Left Atrium: Left atrial size was mildly dilated. Right Atrium: Right atrial size was normal in size. Pericardium: There is no evidence of  pericardial effusion. Mitral Valve: The mitral valve is degenerative in appearance. There is moderate thickening of the mitral valve leaflet(s). There is moderate calcification of the mitral valve leaflet(s). Normal mobility of the mitral valve leaflets. Mild mitral annular calcification. Trivial mitral valve regurgitation. No evidence of mitral valve stenosis. Tricuspid Valve: The tricuspid valve is normal in structure. Tricuspid valve regurgitation is not demonstrated. No evidence of tricuspid stenosis. Aortic Valve: The aortic valve is tricuspid. There is mild calcification of the aortic valve. There is mild thickening of the aortic valve. Aortic valve regurgitation is not visualized. Mild aortic stenosis is present. Pulmonic Valve: The pulmonic valve was grossly normal. Pulmonic valve regurgitation is not visualized. Aorta: The aortic root is normal in size and structure. IAS/Shunts: No atrial level shunt detected by color flow Doppler.  LEFT VENTRICLE PLAX 2D LV EF:         Left            Diastology  ventricular     LV e' medial:    5.55 cm/s                ejection        LV E/e' medial:  19.3                fraction by     LV e' lateral:   9.46 cm/s                PLAX is 63      LV E/e' lateral: 11.3                %. LVIDd:         4.70 cm LVIDs:         3.10 cm LV PW:         1.20 cm LV IVS:        1.20 cm LVOT diam:     2.50 cm LV SV:         115 LV SV Index:   46 LVOT Area:     4.91 cm  RIGHT VENTRICLE RV S prime:     11.40 cm/s TAPSE (M-mode): 1.6 cm LEFT ATRIUM             Index        RIGHT ATRIUM           Index LA diam:        4.10 cm 1.64 cm/m   RA Area:     18.40 cm LA Vol (A2C):   65.4 ml 26.20 ml/m  RA Volume:   48.00 ml  19.23 ml/m LA Vol (A4C):   83.0 ml 33.25 ml/m LA Biplane Vol: 80.3 ml 32.16 ml/m  AORTIC VALVE LVOT Vmax:   104.00 cm/s LVOT Vmean:  68.700 cm/s LVOT VTI:    0.234 m  AORTA Ao Root diam: 4.10 cm Ao Asc diam:  3.80 cm MITRAL VALVE MV Area (PHT): 2.61 cm      SHUNTS MV Decel Time: 291 msec     Systemic VTI:  0.23 m MV E velocity: 107.00 cm/s  Systemic Diam: 2.50 cm MV Andrika Peraza velocity: 123.00 cm/s MV E/Ashanta Amoroso ratio:  0.87 Adrian Prows MD Electronically signed by Adrian Prows MD Signature Date/Time: 06/27/2022/8:50:54 PM    Final    CT CHEST ABDOMEN PELVIS WO CONTRAST  Result Date: 06/23/2022 CLINICAL DATA:  Left foot osteomyelitis. Clinical concern for sepsis. Diabetes. EXAM: CT CHEST, ABDOMEN AND PELVIS WITHOUT CONTRAST TECHNIQUE: Multidetector CT imaging of the chest, abdomen and pelvis was performed following the standard protocol without IV contrast. RADIATION DOSE REDUCTION: This exam was performed according to the departmental dose-optimization program which includes automated exposure control, adjustment of the mA and/or kV according to patient size and/or use of iterative reconstruction technique. COMPARISON:  None Available. FINDINGS: CT CHEST FINDINGS Cardiovascular: Enlarged heart. Atheromatous calcifications, including the coronary arteries and aorta. Mediastinum/Nodes: No enlarged mediastinal, hilar, or axillary lymph nodes. Thyroid gland, trachea, and esophagus demonstrate no significant findings. Lungs/Pleura: Mild bibasilar dependent atelectasis. 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 Lower cervical spine fixation hardware. CT ABDOMEN PELVIS FINDINGS Hepatobiliary: No focal liver abnormality is seen. Status post cholecystectomy. No biliary dilatation. Pancreas: Mild pancreatic atrophy. Spleen: Normal in size without focal abnormality. Adrenals/Urinary Tract: Normal appearing adrenal glands. Stable small exophytic right renal cyst. Unremarkable left kidney, ureters and urinary bladder. Stomach/Bowel: Descending and sigmoid colon diverticula. Mid sigmoid colon anastomosis. Anastomosis involving Smrithi Pigford dilated distal  small bowel loop containing fecalized material. Small appendicoliths in the proximal appendix without evidence of  appendicitis. Unremarkable stomach. Vascular/Lymphatic: Atheromatous arterial calcifications without aneurysm. Multiple mildly enlarged left inguinal lymph nodes with central low density. The largest has Ruthann Angulo short axis diameter of 13 mm on image number 129/3, previously 9 mm. Reproductive: Prostate is unremarkable. Other: Small bilateral inguinal hernias containing fat. 3 supraumbilical ventral hernias containing fat. Midline infraumbilical scar and right lower quadrant anterior scar. Musculoskeletal: Interbody and pedicle screw and rod fusion at the L4 through S1 levels. Lumbar spine degenerative changes. IMPRESSION: 1. Multiple mildly enlarged left inguinal lymph nodes with central low density. This is nonspecific with differential considerations including adenitis, reactive adenopathy and metastatic adenopathy. 2. No acute abnormality in the abdomen or pelvis. 3. Cardiomegaly. 4.  Calcific coronary artery and aortic atherosclerosis. 5. Colonic diverticulosis. 6. Multiple ventral hernias containing fat. Aortic Atherosclerosis (ICD10-I70.0). Electronically Signed   By: Claudie Revering M.D.   On: 06/23/2022 17:51   CT Foot Left Wo Contrast  Result Date: 06/23/2022 CLINICAL DATA:  Foot ulcer, concern for osteomyelitis EXAM: CT OF THE LEFT FOOT WITHOUT CONTRAST TECHNIQUE: Multidetector CT imaging of the left foot was performed according to the standard protocol. Multiplanar CT image reconstructions were also generated. RADIATION DOSE REDUCTION: This exam was performed according to the departmental dose-optimization program which includes automated exposure control, adjustment of the mA and/or kV according to patient size and/or use of iterative reconstruction technique. COMPARISON:  X-ray 06/23/2022, MRI 01/12/2022 FINDINGS: Bones/Joint/Cartilage Bones are diffusely demineralized. 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 Cortical thickening and sclerosis of the third and fourth metatarsal bases and to Senita Corredor  lesser degree within the cuboid compatible with sequela of chronic osteomyelitis. No evidence of Zyionna Pesce new erosion or aggressive periosteal reaction. No fracture or dislocation. Similar degenerative changes of the foot. No tibiotalar or subtalar joint effusions. Ligaments Suboptimally assessed by CT. Muscles and Tendons Denervation and amputation changes of the musculotendinous structures of the foot. No acute findings. No appreciable tenosynovial fluid collection. Soft tissues Shallow soft tissue ulceration at the lateral aspect of the hindfoot in the region of the calcaneocuboid joint. Soft tissue edema at the lateral and dorsal aspects of the foot. No organized fluid collection. No soft tissue gas. IMPRESSION: 1. Shallow soft tissue ulceration at the lateral aspect of the hindfoot in the region of the calcaneocuboid joint with adjacent cellulitis. 2. Findings of chronic osteomyelitis involving the third and fourth metatarsal bases and adjacent cuboid. No evidence of Larren Copes new erosion or aggressive periosteal reaction to suggest acute osteomyelitis by CT. Electronically Signed   By: Davina Poke D.O.   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 No recent fracture or dislocation is seen. Osteopenia is seen in bony structures. No focal lytic lesions are seen. Plantar spur is seen in calcaneus. Degenerative changes are noted in the intertarsal and tarsometatarsal joints. Arterial calcifications are seen in soft tissues. Overall, no significant interval changes are noted. IMPRESSION: No fracture or dislocation is seen. Postsurgical changes as described in the body of the report. There are no focal lytic lesions. If there is clinical suspicion for osteomyelitis, follow-up MRI may be considered. Electronically Signed   By:  Elmer Picker M.D.   On: 06/23/2022 15:47   DG  Chest 2 View  Result Date: 06/23/2022 CLINICAL DATA:  Suspected sepsis EXAM: CHEST - 2 VIEW COMPARISON:  01/11/2022 FINDINGS: The heart size and mediastinal contours are within normal limits. Both lungs are clear. The visualized skeletal structures are unremarkable. IMPRESSION: No active cardiopulmonary disease. Electronically Signed   By: Nelson Chimes M.D.   On: 06/23/2022 15:44    Microbiology: Recent Results (from the past 240 hour(s))  Culture, blood (Routine x 2)     Status: None   Collection Time: 06/23/22  2:04 PM   Specimen: BLOOD  Result Value Ref Range Status   Specimen Description BLOOD RIGHT ANTECUBITAL  Final   Special Requests   Final    BOTTLES DRAWN AEROBIC ONLY Blood Culture adequate volume   Culture   Final    NO GROWTH 5 DAYS Performed at [facility], [address] [place], [place] [address]    Report Status 06/28/2022 FINAL  Final  Culture, blood (Routine x 2)     Status: Abnormal   Collection Time: 06/23/22  4:23 PM   Specimen: BLOOD RIGHT ARM  Result Value Ref Range Status   Specimen Description BLOOD RIGHT ARM  Final   Special Requests   Final    BOTTLES DRAWN AEROBIC AND ANAEROBIC Blood Culture adequate volume   Culture  Setup Time   Final    GRAM POSITIVE COCCI IN CHAINS AEROBIC BOTTLE ONLY CRITICAL RESULT CALLED TO, READ BACK BY AND VERIFIED WITH: Freida Busman FCP Performed at [facility], [address] [place], [place] [address]    Culture GROUP B STREP(S.AGALACTIAE)ISOLATED (Lakie Mclouth)  Final   Report Status 06/26/2022 FINAL  Final   Organism ID, Bacteria GROUP B STREP(S.AGALACTIAE)ISOLATED  Final      Susceptibility   Group b strep(s.agalactiae)isolated - MIC*    CLINDAMYCIN <=0.25 SENSITIVE Sensitive     AMPICILLIN <=0.25 SENSITIVE Sensitive     ERYTHROMYCIN <=0.12 SENSITIVE Sensitive     VANCOMYCIN 0.5 SENSITIVE Sensitive     CEFTRIAXONE <=0.12 SENSITIVE Sensitive      LEVOFLOXACIN 0.5 SENSITIVE Sensitive     PENICILLIN <=0.06 SENSITIVE Sensitive     * GROUP B STREP(S.AGALACTIAE)ISOLATED  Resp Panel by RT-PCR (Flu Deral Schellenberg&B, Covid) Anterior Nasal Swab     Status: None   Collection Time: 06/23/22  4:23 PM   Specimen: Anterior Nasal Swab  Result Value Ref Range Status   SARS Coronavirus 2 by RT PCR NEGATIVE NEGATIVE Final    Comment: (NOTE) SARS-CoV-2 target nucleic acids are NOT DETECTED.  The SARS-CoV-2 RNA is generally detectable in upper respiratory specimens during the acute phase of infection. The lowest concentration of SARS-CoV-2 viral copies this assay can detect is 138 copies/mL. Tesneem Dufrane negative result does not preclude SARS-Cov-2 infection and should not be used as the sole basis for treatment or other patient management decisions. Golden Gilreath negative result may occur with  improper specimen collection/handling, submission of specimen other than nasopharyngeal swab, presence of viral mutation(s) within the areas targeted by this assay, and inadequate number of viral copies(<138 copies/mL). Camilo Mander negative result must be combined with clinical observations, patient history, and epidemiological information. The expected result is Negative.  Fact Sheet for Patients:  EntrepreneurPulse.com.au  Fact Sheet for Healthcare Providers:  IncredibleEmployment.be  This test is no t yet approved or cleared by the [place] FDA and  has been authorized for detection and/or diagnosis of SARS-CoV-2 by FDA under an Emergency Use Authorization (EUA). This EUA will remain  in effect (meaning this test can be used) for the duration of the COVID-19 declaration under Section 564(b)(1) of the Act, 21 U.S.C.section 360bbb-3(b)(1), unless the authorization is terminated  or revoked sooner.       Influenza Savaughn Karwowski by PCR NEGATIVE NEGATIVE Final   Influenza B by PCR NEGATIVE NEGATIVE Final    Comment: (NOTE) The Xpert Xpress SARS-CoV-2/FLU/RSV  plus assay is intended as an aid in the diagnosis of influenza from Nasopharyngeal swab specimens and should not be used as Kristl Morioka sole basis for treatment. Nasal washings and aspirates are unacceptable for Xpert Xpress SARS-CoV-2/FLU/RSV testing.  Fact Sheet for Patients: EntrepreneurPulse.com.au  Fact Sheet for Healthcare Providers: IncredibleEmployment.be  This test is not yet approved or cleared by the [place] FDA and has been authorized for detection and/or diagnosis of SARS-CoV-2 by FDA under an Emergency Use Authorization (EUA). This EUA will remain in effect (meaning this test can be used) for the duration of the COVID-19 declaration under Section 564(b)(1) of the Act, 21 U.S.C. section 360bbb-3(b)(1), unless the authorization is terminated or revoked.  Performed at [facility], [place] [address] [place], [place] [address]   Blood Culture ID Panel (Reflexed)     Status: Abnormal   Collection Time: 06/23/22  4:23 PM  Result Value Ref Range Status   Enterococcus faecalis NOT DETECTED NOT DETECTED Final   Enterococcus Faecium NOT DETECTED NOT DETECTED Final   Listeria monocytogenes NOT DETECTED NOT DETECTED Final   Staphylococcus species NOT DETECTED NOT DETECTED Final   Staphylococcus aureus (BCID) NOT DETECTED NOT DETECTED Final   Staphylococcus epidermidis NOT DETECTED NOT DETECTED Final   Staphylococcus lugdunensis NOT DETECTED NOT DETECTED Final   Streptococcus species DETECTED (Joab Carden) NOT DETECTED Final    Comment: CRITICAL RESULT CALLED TO, READ BACK BY AND VERIFIED WITH: Experiment [id number] FCP    Streptococcus agalactiae DETECTED (Rileyann Florance) NOT DETECTED Final    Comment: CRITICAL RESULT CALLED TO, READ BACK BY AND VERIFIED WITH: Webster [id number] FCP    Streptococcus pneumoniae NOT DETECTED NOT DETECTED Final   Streptococcus pyogenes NOT DETECTED NOT DETECTED Final   Zanyia Silbaugh.calcoaceticus-baumannii NOT DETECTED NOT  DETECTED Final   Bacteroides fragilis NOT DETECTED NOT DETECTED Final   Enterobacterales NOT DETECTED NOT DETECTED Final   Enterobacter cloacae complex NOT DETECTED NOT DETECTED Final   Escherichia coli NOT DETECTED NOT DETECTED Final   Klebsiella aerogenes NOT DETECTED NOT DETECTED Final   Klebsiella oxytoca NOT DETECTED NOT DETECTED Final   Klebsiella pneumoniae NOT DETECTED NOT DETECTED Final   Proteus species NOT DETECTED NOT DETECTED Final   Salmonella species NOT DETECTED NOT DETECTED Final   Serratia marcescens NOT DETECTED NOT DETECTED Final   Haemophilus influenzae NOT DETECTED NOT DETECTED Final   Neisseria meningitidis NOT DETECTED NOT DETECTED Final   Pseudomonas aeruginosa NOT DETECTED NOT DETECTED Final   Stenotrophomonas maltophilia NOT DETECTED NOT DETECTED Final   Candida albicans NOT DETECTED NOT DETECTED Final   Candida auris NOT DETECTED NOT DETECTED Final   Candida glabrata NOT DETECTED NOT DETECTED Final   Candida krusei NOT DETECTED NOT DETECTED Final   Candida parapsilosis NOT DETECTED NOT DETECTED Final   Candida tropicalis NOT DETECTED NOT DETECTED Final   Cryptococcus neoformans/gattii NOT DETECTED NOT DETECTED Final    Comment: Performed at [facility], [place]. [address] [place],  [address]  Culture, blood (Routine X 2) w Reflex to ID Panel     Status: None (Preliminary  result)   Collection Time: 06/25/22 12:53 PM   Specimen: BLOOD  Result Value Ref Range Status   Specimen Description BLOOD RIGHT ANTECUBITAL  Final   Special Requests   Final    BOTTLES DRAWN AEROBIC AND ANAEROBIC Blood Culture adequate volume   Culture   Final    NO GROWTH 3 DAYS Performed at [facility], [address], [place], [place] [address]    Report Status PENDING  Incomplete  Culture, blood (Routine X 2) w Reflex to ID Panel     Status: None (Preliminary result)   Collection Time: 06/25/22 12:58 PM   Specimen: BLOOD RIGHT FOREARM  Result Value Ref Range  Status   Specimen Description BLOOD RIGHT FOREARM  Final   Special Requests   Final    BOTTLES DRAWN AEROBIC AND ANAEROBIC Blood Culture adequate volume   Culture   Final    NO GROWTH 3 DAYS Performed at [facility], [place] [address], [place],  [address]    Report Status PENDING  Incomplete     Labs: Basic Metabolic Panel: Recent Labs  Lab 06/24/22 0103 06/24/22 1349 06/25/22 0157 06/26/22 1513 06/27/22 0203 06/28/22 0145  NA 139  --  135 136 137 136  K 4.2  --  3.6 3.5 3.5 3.4*  CL 103  --  101 98 97* 96*  CO2 21*  --  22 23 28 26   GLUCOSE 196*  --  223* 238* 351* 255*  BUN 35*  --  49* 35* 20 29*  CREATININE 6.16*  --  7.28* 5.93* 4.07* 5.45*  CALCIUM 7.8*  --  7.7* 7.6* 8.0* 7.8*  MG  --   --   --   --   --  1.5*  PHOS  --  4.7* 5.3* 3.2 2.8 3.1   Liver Function Tests: Recent Labs  Lab 06/23/22 1404 06/24/22 0103 06/25/22 0157 06/26/22 1513 06/27/22 0203 06/28/22 0145  AST 20 23  --   --   --  15  ALT 16 14  --   --   --  8  ALKPHOS 133* 94  --   --   --  83  BILITOT 0.9 0.9  --   --   --  0.4  PROT 6.1* 5.0*  --   --   --  4.9*  ALBUMIN 3.1* 2.4* 2.3* 2.4* 2.4* 2.3*   No results for input(s): "LIPASE", "AMYLASE" in the last 168 hours. No results for input(s): "AMMONIA" in the last 168 hours. CBC: Recent Labs  Lab 06/23/22 1404 06/23/22 1646 06/24/22 0103 06/25/22 0157 06/26/22 1513 06/27/22 0203 06/28/22 0145  WBC 13.5*  --  13.4* 9.3 6.0 4.9 5.0  NEUTROABS 12.2*  --   --   --   --   --  3.0  HGB 11.3*   < > 9.5* 9.0* 9.8* 10.1* 8.8*  HCT 33.0*   < > 28.7* 26.3* 28.6* 29.2* 26.2*  MCV 93.0  --  94.7 92.0 92.0 90.4 92.3  PLT 141*  --  134* 133* 137* 126* 123*   < > = values in this interval not displayed.   Cardiac Enzymes: No results for input(s): "CKTOTAL", "CKMB", "CKMBINDEX", "TROPONINI" in the last 168 hours. BNP: BNP (last 3 results) No results for input(s): "BNP" in the last 8760 hours.  ProBNP (last 3 results) No  results for input(s): "PROBNP" in the last 8760 hours.  CBG: Recent Labs  Lab 06/27/22 1131 06/27/22 1557 06/27/22  2111 06/28/22 0809 06/28/22 1207  GLUCAP 289* 289* 312* 195* 236*       Signed:  Fayrene Helper MD.  [facility] 06/28/2022, 5:02 PM

## 2022-06-28 NOTE — Progress Notes (Signed)
Pharmacy Antibiotic Note  [name] is a 66 y.o. 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 Pharmacy consulted to add Gentamicin for synergy for 2 weeks.  ESRD-TTS, Vd 0.4, will plan to load with HD today for goal peak 3-4 mcg/ml and supplement 70-80% back after each HD for his maintenance dose.  Plan: - Gentamicin 140 mg IV x 1 dose with HD today - Start Gentamicin 100 mg for maintenance doses with each HD session, typically TTS thru 07/12/22 - Will continue to follow HD schedule/duration for adjustments in antibiotic dosing  Height: '6\' 2"'$  (188 cm) Weight: 126.3 kg (278 lb 7.1 oz) IBW/kg (Calculated) : 82.2  Temp (24hrs), Avg:98.6 F (37 C), Min:98.4 F (36.9 C), Max:99.1 F (37.3 C)  Recent Labs  Lab 06/23/22 1348 06/23/22 1404 06/23/22 1623 06/23/22 1823 06/23/22 2208 06/24/22 0103 06/25/22 0157 06/26/22 1513 06/27/22 0203 06/28/22 0145  WBC  --    < >  --   --   --  13.4* 9.3 6.0 4.9 5.0  CREATININE  --    < >  --   --   --  6.16* 7.28* 5.93* 4.07* 5.45*  LATICACIDVEN 3.7*  --  4.1* 5.2* 4.9* 3.7*  --   --   --   --    < > = values in this interval not displayed.    Estimated Creatinine Clearance: 18.3 mL/min (A) (by C-G formula based on SCr of 5.45 mg/dL (H)).    Allergies  Allergen Reactions   Contrast Media [Iodinated Contrast Media] Shortness Of Breath and Other (See Comments)    Difficulty breathing and altered mental status     Ivp Dye [Iodinated Contrast Media] Anaphylaxis, Shortness Of Breath and Other (See Comments)    Breathing problems, altered mental state    Adhesive [Tape] Rash and Other (See Comments)    Rash after 1 day of use   Latex Rash and Other (See Comments)    A severe rash appears after the first 24 hours of being placed    Antimicrobials this admission: CTX 10/14 >> 10/15 Flagyl 10/14 >> 10/15 Vancomycin 10/14 x 1 PCN 10/15  >> 10/16 Cefazolin 10/16 >> Gentamicin 10/19 >>  Dose adjustments this admission: N/a  Microbiology results: 10/14 BCx >> GBS 10/16 BCx >> [id number]  Thank you for allowing pharmacy to be a part of this patient's care.  Alycia Rossetti, PharmD, BCPS Infectious Diseases Clinical Pharmacist 06/28/2022 12:28 PM   **Pharmacist phone directory can now be found on [email or web address] ([email or web address]).  Listed under [place].

## 2022-06-28 NOTE — Progress Notes (Addendum)
ID Brief Note   Afebrile Blood cx 10/16 remains negative in 3 days   TTE 06/25/22 IMPRESSIONS Left ventricular ejection fraction, by estimation, is 55 to 60%. Left ventricular ejection fraction by PLAX is 63 %. The left ventricle has normal function. The left ventricle has no regional wall motion abnormalities. There is mild left ventricular hypertrophy. Left ventricular diastolic parameters are consistent with Grade II diastolic dysfunction (pseudonormalization). Elevated left ventricular end-diastolic pressure. 1. 2. Right ventricular systolic function is normal. The right ventricular size is normal. 3. Left atrial size was mildly dilated. The mitral valve is degenerative. Trivial mitral valve regurgitation. No evidence of mitral stenosis. 4. The aortic valve is tricuspid. There is mild calcification of the aortic valve. There is mild thickening of the aortic valve. Aortic valve regurgitation is not visualized. Mild aortic valve stenosis. 5. Comparison(s): No significant change from 06/17/2021, valvular calcification was noted previously. Clinical correlation recommended. Conclusion(s)/Recommendation(s): Findings consistent with hypertensive heart disease. Findings concerning for Moderate AV and MV calcification, vegetations cannot be completely excluded. There appearas to be mobile mass on both leaflets, most probably related to calcification and reverbation, how ever vegetations cannot be excluded, would recommend Transesophageal Echocardiogram for clarification.  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 [place], [place] [address] Phone: [phone]  Fax: [phone]

## 2022-06-28 NOTE — Progress Notes (Signed)
   06/28/22 1929  Vitals  BP (!) 153/62  MAP (mmHg) 87  BP Location Right Arm  BP Method Automatic  Patient Position (if appropriate) Lying  Pulse Rate 74  Pulse Rate Source Monitor  ECG Heart Rate 77  Post Treatment  Dialyzer Clearance Lightly streaked  Duration of HD Treatment -hour(s) 4 hour(s)  Hemodialysis Intake (mL) 0 mL  Liters Processed 96  Fluid Removed 4500 mL  Tolerated HD Treatment Yes  Post-Hemodialysis Comments pt tolerated tx. RN gave report. vitals and pt stable. awaiting transport  AVG/AVF Arterial Site Held (minutes) 7 minutes  AVG/AVF Venous Site Held (minutes) 8 minutes   Tx fin. W/o difficulty.

## 2022-06-29 DIAGNOSIS — I120 Hypertensive chronic kidney disease with stage 5 chronic kidney disease or end stage renal disease: Secondary | ICD-10-CM | POA: Diagnosis not present

## 2022-06-29 DIAGNOSIS — L03116 Cellulitis of left lower limb: Secondary | ICD-10-CM | POA: Diagnosis not present

## 2022-06-29 DIAGNOSIS — N186 End stage renal disease: Secondary | ICD-10-CM | POA: Diagnosis not present

## 2022-06-29 DIAGNOSIS — E872 Acidosis, unspecified: Secondary | ICD-10-CM | POA: Diagnosis not present

## 2022-06-29 DIAGNOSIS — A419 Sepsis, unspecified organism: Secondary | ICD-10-CM | POA: Diagnosis not present

## 2022-06-29 DIAGNOSIS — Z992 Dependence on renal dialysis: Secondary | ICD-10-CM | POA: Diagnosis not present

## 2022-06-29 DIAGNOSIS — N250 Renal osteodystrophy: Secondary | ICD-10-CM | POA: Diagnosis not present

## 2022-06-29 DIAGNOSIS — D631 Anemia in chronic kidney disease: Secondary | ICD-10-CM | POA: Diagnosis not present

## 2022-06-29 DIAGNOSIS — R0602 Shortness of breath: Secondary | ICD-10-CM | POA: Diagnosis not present

## 2022-06-29 DIAGNOSIS — R6520 Severe sepsis without septic shock: Secondary | ICD-10-CM | POA: Diagnosis not present

## 2022-06-29 LAB — CBC WITH DIFFERENTIAL/PLATELET
Abs Immature Granulocytes: 0.03 10*3/uL (ref 0.00–0.07)
Basophils Absolute: 0 10*3/uL (ref 0.0–0.1)
Basophils Relative: 1 %
Eosinophils Absolute: 0.2 10*3/uL (ref 0.0–0.5)
Eosinophils Relative: 4 %
HCT: 28.5 % — ABNORMAL LOW (ref 39.0–52.0)
Hemoglobin: 9.6 g/dL — ABNORMAL LOW (ref 13.0–17.0)
Immature Granulocytes: 1 %
Lymphocytes Relative: 27 %
Lymphs Abs: 1.4 10*3/uL (ref 0.7–4.0)
MCH: 30.9 pg (ref 26.0–34.0)
MCHC: 33.7 g/dL (ref 30.0–36.0)
MCV: 91.6 fL (ref 80.0–100.0)
Monocytes Absolute: 0.5 10*3/uL (ref 0.1–1.0)
Monocytes Relative: 9 %
Neutro Abs: 3 10*3/uL (ref 1.7–7.7)
Neutrophils Relative %: 58 %
Platelets: 144 10*3/uL — ABNORMAL LOW (ref 150–400)
RBC: 3.11 MIL/uL — ABNORMAL LOW (ref 4.22–5.81)
RDW: 13.2 % (ref 11.5–15.5)
WBC: 5.1 10*3/uL (ref 4.0–10.5)
nRBC: 0 % (ref 0.0–0.2)

## 2022-06-29 LAB — COMPREHENSIVE METABOLIC PANEL
ALT: 8 U/L (ref 0–44)
AST: 24 U/L (ref 15–41)
Albumin: 2.5 g/dL — ABNORMAL LOW (ref 3.5–5.0)
Alkaline Phosphatase: 86 U/L (ref 38–126)
Anion gap: 13 (ref 5–15)
BUN: 16 mg/dL (ref 8–23)
CO2: 28 mmol/L (ref 22–32)
Calcium: 8.2 mg/dL — ABNORMAL LOW (ref 8.9–10.3)
Chloride: 96 mmol/L — ABNORMAL LOW (ref 98–111)
Creatinine, Ser: 3.86 mg/dL — ABNORMAL HIGH (ref 0.61–1.24)
GFR, Estimated: 16 mL/min — ABNORMAL LOW (ref >60)
Glucose, Bld: 210 mg/dL — ABNORMAL HIGH (ref 70–99)
Potassium: 3.4 mmol/L — ABNORMAL LOW (ref 3.5–5.1)
Sodium: 137 mmol/L (ref 135–145)
Total Bilirubin: 0.3 mg/dL (ref 0.3–1.2)
Total Protein: 5.3 g/dL — ABNORMAL LOW (ref 6.5–8.1)

## 2022-06-29 LAB — RENAL FUNCTION PANEL
Albumin: 2.6 g/dL — ABNORMAL LOW (ref 3.5–5.0)
Anion gap: 12 (ref 5–15)
BUN: 25 mg/dL — ABNORMAL HIGH (ref 8–23)
CO2: 26 mmol/L (ref 22–32)
Calcium: 8.1 mg/dL — ABNORMAL LOW (ref 8.9–10.3)
Chloride: 96 mmol/L — ABNORMAL LOW (ref 98–111)
Creatinine, Ser: 4.69 mg/dL — ABNORMAL HIGH (ref 0.61–1.24)
GFR, Estimated: 13 mL/min — ABNORMAL LOW (ref >60)
Glucose, Bld: 260 mg/dL — ABNORMAL HIGH (ref 70–99)
Phosphorus: 2.8 mg/dL (ref 2.5–4.6)
Potassium: 3.4 mmol/L — ABNORMAL LOW (ref 3.5–5.1)
Sodium: 134 mmol/L — ABNORMAL LOW (ref 135–145)

## 2022-06-29 LAB — CBC
HCT: 28.6 % — ABNORMAL LOW (ref 39.0–52.0)
Hemoglobin: 10 g/dL — ABNORMAL LOW (ref 13.0–17.0)
MCH: 31.7 pg (ref 26.0–34.0)
MCHC: 35 g/dL (ref 30.0–36.0)
MCV: 90.8 fL (ref 80.0–100.0)
Platelets: 165 10*3/uL (ref 150–400)
RBC: 3.15 MIL/uL — ABNORMAL LOW (ref 4.22–5.81)
RDW: 13.2 % (ref 11.5–15.5)
WBC: 6.9 10*3/uL (ref 4.0–10.5)
nRBC: 0 % (ref 0.0–0.2)

## 2022-06-29 LAB — GLUCOSE, CAPILLARY
Glucose-Capillary: 174 mg/dL — ABNORMAL HIGH (ref 70–99)
Glucose-Capillary: 184 mg/dL — ABNORMAL HIGH (ref 70–99)
Glucose-Capillary: 179 mg/dL — ABNORMAL HIGH (ref 70–99)
Glucose-Capillary: 293 mg/dL — ABNORMAL HIGH (ref 70–99)

## 2022-06-29 LAB — PHOSPHORUS: Phosphorus: 2.8 mg/dL (ref 2.5–4.6)

## 2022-06-29 LAB — MAGNESIUM: Magnesium: 1.5 mg/dL — ABNORMAL LOW (ref 1.7–2.4)

## 2022-06-29 MED ORDER — PENTAFLUOROPROP-TETRAFLUOROETH EX AERO: 1.0000 | INHALATION_SPRAY | CUTANEOUS | Status: DC | PRN

## 2022-06-29 MED ORDER — ALTEPLASE 2 MG IJ SOLR
2.0000 mg | Freq: Once | INTRAMUSCULAR | Status: DC | PRN
  Filled 2022-06-29: qty 2

## 2022-06-29 MED ORDER — HEPARIN SODIUM (PORCINE) 1000 UNIT/ML DIALYSIS
1000.0000 [IU] | INTRAMUSCULAR | Status: DC | PRN
  Filled 2022-06-29: qty 1

## 2022-06-29 MED ORDER — CHLORHEXIDINE GLUCONATE CLOTH 2 % EX PADS: 6.0000 | MEDICATED_PAD | Freq: Every day | CUTANEOUS | Status: DC

## 2022-06-29 MED ORDER — LIDOCAINE HCL (PF) 1 % IJ SOLN: 5.0000 mL | INTRAMUSCULAR | Status: DC | PRN

## 2022-06-29 MED ORDER — ANTICOAGULANT SODIUM CITRATE 4% (200MG/5ML) IV SOLN
5.0000 mL | Status: DC | PRN
  Filled 2022-06-29: qty 5

## 2022-06-29 MED ORDER — LIDOCAINE-PRILOCAINE 2.5-2.5 % EX CREA
1.0000 | TOPICAL_CREAM | CUTANEOUS | Status: DC | PRN
  Filled 2022-06-29: qty 5

## 2022-06-29 NOTE — Progress Notes (Signed)
Patient intended to be discharged after dialysis yesterday, unfortunately did not discharge.  In stable condition today, plan for discharge today.  No complaints today  Vitals:   06/29/22 0553 06/29/22 0814  BP: (!) 143/63 (!) 141/58  Pulse: 74 72  Resp:  16  Temp: 98.6 F (37 C) 98.5 F (36.9 C)  SpO2: 97% 94%   NAD RRR Unlabored LLE with dressing intact, boot - RLE amputation, prosthetic leg Moving all extremities  AP - stable for discharge.  See d/c summary yesterday for additional details.

## 2022-06-29 NOTE — Progress Notes (Signed)
Physical Therapy Treatment Patient Details Name: [name] MRN: [id number] DOB: Oct 31, 1955 Today's Date: 06/29/2022   History of Present Illness 66 yo male with onset of fever and weakness was admitted 10/14, now has  dx sepsis with lactic acidosis.  Had AMS, potentially having L BKA.  Reports pain in R shoulder, R LE phantom pain, L foot pain, neck and back pain.  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 Assisted pt with transfer to [facility]. HHPT follow up with anticipated return next week for BKA.    Recommendations for follow up therapy are one component of a multi-disciplinary discharge planning process, led by the attending physician.  Recommendations may be updated based on patient status, additional functional criteria and insurance authorization.  Follow Up Recommendations  Home health PT Can patient physically be transported by private vehicle: Yes   Assistance Recommended at Discharge Intermittent Supervision/Assistance  Patient can return home with the following A little help with walking and/or transfers;A little help with bathing/dressing/bathroom;Help with stairs or ramp for entrance;Assist for transportation   Equipment Recommendations  None recommended by PT    Recommendations for Other Services       Precautions / Restrictions Precautions Precautions: Fall Precaution Comments: awaiting L BKA Restrictions Weight Bearing Restrictions: No Other Position/Activity Restrictions: has RLE prosthetic     Mobility  Bed Mobility Overal bed mobility: Modified Independent Bed Mobility: Supine to Sit     Supine to sit: Modified independent (Device/Increase time), HOB elevated     General bed mobility comments: Incr  time    Transfers Overall transfer level: Needs assistance Equipment used: None Transfers: Bed to chair/wheelchair/BSC       Squat pivot transfers: Min guard     General transfer comment: Pt initially asked for walker for transfer but ended up performing squat pivot with assist for safety    Ambulation/Gait                   Stairs             Wheelchair Mobility    Modified Rankin (Stroke Patients Only)       Balance Overall balance assessment: Needs assistance Sitting-balance support: Feet supported, No upper extremity supported Sitting balance-Leahy Scale: Good                                      Cognition Arousal/Alertness: Awake/alert Behavior During Therapy: Flat affect Overall Cognitive Status: Within Functional Limits for tasks assessed                                          Exercises      General Comments        Pertinent Vitals/Pain Pain Assessment Pain Assessment: Faces Faces Pain Scale: Hurts a little bit Pain Location: LLE Pain Descriptors / Indicators: Discomfort Pain Intervention(s): Limited activity within patient's tolerance    Home Living  Prior Function            PT Goals (current goals can now be found in the care plan section) Progress towards PT goals: Progressing toward goals    Frequency    Min 3X/week      PT Plan Current plan remains appropriate    Co-evaluation              AM-PAC PT "6 Clicks" Mobility   Outcome Measure  Help needed turning from your back to your side while in a flat bed without using bedrails?: None Help needed moving from lying on your back to sitting on the side of a flat bed without using bedrails?: A Little Help needed moving to and from a bed to a chair (including a wheelchair)?: A Little Help needed standing up from a chair using your arms (e.g., wheelchair or bedside chair)?: Total Help  needed to walk in hospital room?: Total Help needed climbing 3-5 steps with a railing? : Total 6 Click Score: 13    End of Session   Activity Tolerance: Patient tolerated treatment well Patient left: Other (comment);with call bell/phone within reach (On [facility]) Nurse Communication: Other (comment) (notified nurse tech that pt was on bsc) PT Visit Diagnosis: Unsteadiness on feet (R26.81);Muscle weakness (generalized) (M62.81);Difficulty in walking, not elsewhere classified (R26.2);Other abnormalities of gait and mobility (R26.89) Pain - part of body: Ankle and joints of foot;Leg     Time: 8032-1224 PT Time Calculation (min) (ACUTE ONLY): 10 min  Charges:  $Therapeutic Activity: 8-22 mins                     [place] 06/29/2022, 1:16 PM

## 2022-06-29 NOTE — Progress Notes (Signed)
Pt was not d/c last evening as hoped. Contacted [facility] this morning and clinic does not have pt on the schedule for today and unable to work pt in. Clinic can treat pt tomorrow at regular time but unsure if they can place pt in a first shift appt at this time. Spoke to pt via phone. Pt advised that clinic does not have an appt for pt today but could possibly treat pt first shift tomorrow. Pt states he cannot do treatment tomorrow first shift due to pt's daughter's wedding tomorrow. Update provided to renal extenders and inpt HD unit. Contacted clinic to make them aware pt cannot do first shift appt tomorrow and will resume on Tuesday. Clinic advised pt will require iv abx with HD and [facility] staff will send orders.   Melven Sartorius Renal Navigator [phone]

## 2022-06-29 NOTE — Progress Notes (Addendum)
[facility] Progress Note   Subjective:   Patient seen and examined at bedside.  Was to d/c home last night but it did not happen.  He has transport set up to pick him up from home for dialysis today.  Reached out to renal navigator to see if she can help facilitate him going home.  He does not have a ride this AM. Says he feels the best he has in a while.  Denies CP, SOB, abdominal pain and n/v/d. 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, LLE dressed, trace edema Dialysis Access: LU AVF +b/t   Filed Weights   06/25/22 1900 06/26/22 1457 06/28/22 1729  Weight: 128.5 kg 126.3 kg 121.8 kg    Intake/Output Summary (Last 24 hours) at 06/29/2022 0659 Last data filed at 06/28/2022 1929 Gross per 24 hour  Intake --  Output 9350 ml  Net -9350 ml    Additional Objective Labs: Basic Metabolic Panel: Recent Labs  Lab 06/27/22 0203 06/28/22 0145 06/29/22 0217  NA 137 136 137  K 3.5 3.4* 3.4*  CL 97* 96* 96*  CO2 '28 26 28  '$ GLUCOSE 351* 255* 210*  BUN 20 29* 16  CREATININE 4.07* 5.45* 3.86*  CALCIUM 8.0* 7.8* 8.2*  PHOS 2.8 3.1 2.8   Liver Function Tests: Recent Labs  Lab 06/24/22 0103 06/25/22 0157 06/27/22 0203 06/28/22 0145 06/29/22 0217  AST 23  --   --  15 24  ALT 14  --   --  8 8  ALKPHOS 94  --   --  83 86  BILITOT 0.9  --   --  0.4 0.3  PROT 5.0*  --   --  4.9* 5.3*  ALBUMIN 2.4*   < > 2.4* 2.3* 2.5*   < > = values in this interval not  displayed.   CBC: Recent Labs  Lab 06/23/22 1404 06/23/22 1646 06/25/22 0157 06/26/22 1513 06/27/22 0203 06/28/22 0145 06/29/22 0217  WBC 13.5*   < > 9.3 6.0 4.9 5.0 5.1  NEUTROABS 12.2*  --   --   --   --  3.0 3.0  HGB 11.3*   < > 9.0* 9.8* 10.1* 8.8* 9.6*  HCT 33.0*   < > 26.3* 28.6* 29.2* 26.2* 28.5*  MCV 93.0   < > 92.0 92.0 90.4 92.3 91.6  PLT 141*   < > 133* 137* 126* 123* 144*   < > = values in this interval not displayed.   Blood Culture    Component Value Date/Time   SDES BLOOD RIGHT FOREARM 06/25/2022 1258   SPECREQUEST  06/25/2022 1258    BOTTLES DRAWN AEROBIC AND ANAEROBIC Blood Culture adequate volume   CULT  06/25/2022 1258    NO GROWTH 3 DAYS Performed at [facility], [place] [address] [place], [place] [address]    REPTSTATUS PENDING  06/25/2022 1258      Medications:   ceFAZolin (ANCEF) IV 2 g (06/28/22 1802)   [START ON 06/30/2022] gentamicin      (feeding supplement) PROSource Plus  30 mL Oral BID BM   amiodarone  100 mg Oral Daily   apixaban  5 mg Oral BID   atorvastatin  40 mg Oral QHS   darbepoetin (ARANESP) injection - DIALYSIS  100 mcg Intravenous Q Thu-HD   famotidine  20 mg Oral Daily   fentaNYL  1 patch Transdermal I34V   folic acid  1 mg Oral Daily   [name]'s butt cream   Topical TID   insulin aspart  0-9 Units Subcutaneous TID WC   insulin aspart  4 Units Subcutaneous TID WC   insulin glargine-yfgn  20 Units Subcutaneous QHS   loratadine  10 mg Oral Daily   metoCLOPramide  5 mg Oral TID AC   midodrine  10 mg Oral Q T,Th,Sa-HD   nitroGLYCERIN  0.2 mg Transdermal Daily   nutrition supplement (JUVEN)  1 packet Oral BID BM   pantoprazole  40 mg Oral Daily   pregabalin  100 mg Oral BID   rOPINIRole  2 mg Oral QHS   tamsulosin  0.4 mg Oral Daily    Dialysis Orders: [facility] 4h 33mn  450/800   112kg   2/2 bath  Hep none  LUA AVF - last HD 10/12, post wt 120kg - comes off 5-8kg over usually - mircera 30 mg q 4 wks, last  10/7 - doxercalciferol 2 ug tiw IV tts       BP 120- 160 / 60- 75,  HR 68- 98, RR 14- 23  tmax 103    Na 139  K 4 .2  BUN 35  Cr 6.1       CXR 10/14 - IMPRESSION: No active cardiopulmonary disease.     Assessment/ Plan: Sepsis w/Bacteremia - w/ temp 103 on admit, high HR/ RR, L leg erythema c/w cellulitis. Now afebrile. Blood cultures + Group B strep. ABX narrowed to PEN G per pharm.  TTE with degenerative valve disease and unable to completely exclude vegetations, plan to treat as IE and give ABX for 4-6wks.   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 S/p 6 wks IV abx per prior dc summary. Plan to return for BKA next week. ESRD - on HD TTS. Has HD set up off schedule today at OP HD, if able to get there plan is for HD there.  If not will require HD here before going home. He actually did not have anything set up and despite having 7 pts in the hospital - the OP center claims they have no room-  will need to have HD here prior to discharge  PAD - sp R BKA, L ray amp CAD h/o PCI PAF - in NSR here, per pmd  HTN/ vol  - takes amlodipine at home - currently held, and pre HD midodrine. BP variable, if consistently elevated, restart amlodipine. Does not get close to his dry wt at OP HD, coming off 5-8 kg over. Large gains. LE edema but no respiratory issues.  Volume status improving, continue to titrate down volume as tolerated. Unsure of accuracy of bed weights.  Anemia esrd - Hgb^9.6. ESA dosed q4wks, change to q2wks with drop in Hgb, given 10/19.  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 Nutrition - Renal Diet w/fluid restrictions. Alb 2.4 -  give protein supplements. Dispo - D/c summary complete.  To go home and return next week for BKA.  Hopefully will be able to get to OP HD today.   Jen Mow, PA-C [facility] 06/29/2022,6:59 AM  LOS: 6 days    Patient seen and examined, agree with above note with above modifications. Pt  feels fine-  dispo issues as above -  hopefully will be discharged this evening after HD  Corliss Parish, MD 06/29/2022

## 2022-06-30 LAB — CULTURE, BLOOD (ROUTINE X 2)
Culture: NO GROWTH
Culture: NO GROWTH
Special Requests: ADEQUATE
Special Requests: ADEQUATE

## 2022-07-01 NOTE — TOC Transition Note (Signed)
Transition of care contact from inpatient facility  Date of discharge: 06/29/22 Date of contact: 07/01/22 Method: Phone Spoke to: Patient  Patient contacted to discuss transition of care from recent inpatient hospitalization. Patient was admitted to [facility] from 06/23/22-06/29/22 with discharge diagnosis of sepsis  Medication changes were reviewed.  Patient will follow up with his outpatient HD unit on 07/03/22 at [facility].  Tobie Poet, NP

## 2022-07-02 ENCOUNTER — Encounter (INDEPENDENT_AMBULATORY_CARE_PROVIDER_SITE_OTHER): Payer: Medicare HMO | Admitting: Ophthalmology

## 2022-07-02 DIAGNOSIS — Z794 Long term (current) use of insulin: Secondary | ICD-10-CM | POA: Diagnosis not present

## 2022-07-02 DIAGNOSIS — E113513 Type 2 diabetes mellitus with proliferative diabetic retinopathy with macular edema, bilateral: Secondary | ICD-10-CM | POA: Diagnosis not present

## 2022-07-02 DIAGNOSIS — E1137X3 Type 2 diabetes mellitus with diabetic macular edema, resolved following treatment, bilateral: Secondary | ICD-10-CM | POA: Diagnosis not present

## 2022-07-03 ENCOUNTER — Emergency Department (HOSPITAL_COMMUNITY): Payer: Medicare HMO

## 2022-07-03 ENCOUNTER — Encounter (HOSPITAL_COMMUNITY): Payer: Self-pay

## 2022-07-03 ENCOUNTER — Other Ambulatory Visit: Payer: Self-pay

## 2022-07-03 ENCOUNTER — Telehealth: Payer: Self-pay | Admitting: Orthopedic Surgery

## 2022-07-03 ENCOUNTER — Inpatient Hospital Stay (HOSPITAL_COMMUNITY)
Admission: EM | Admit: 2022-07-03 | Discharge: 2022-07-16 | DRG: 853 | Disposition: A | Discharge status: Skilled Nursing Facility | Payer: Medicare HMO | Attending: Orthopedic Surgery | Admitting: Orthopedic Surgery

## 2022-07-03 DIAGNOSIS — G8918 Other acute postprocedural pain: Secondary | ICD-10-CM | POA: Diagnosis not present

## 2022-07-03 DIAGNOSIS — Z89511 Acquired absence of right leg below knee: Secondary | ICD-10-CM

## 2022-07-03 DIAGNOSIS — Z741 Need for assistance with personal care: Secondary | ICD-10-CM | POA: Diagnosis not present

## 2022-07-03 DIAGNOSIS — Z992 Dependence on renal dialysis: Secondary | ICD-10-CM | POA: Diagnosis not present

## 2022-07-03 DIAGNOSIS — N250 Renal osteodystrophy: Secondary | ICD-10-CM | POA: Diagnosis not present

## 2022-07-03 DIAGNOSIS — L089 Local infection of the skin and subcutaneous tissue, unspecified: Secondary | ICD-10-CM | POA: Diagnosis not present

## 2022-07-03 DIAGNOSIS — I959 Hypotension, unspecified: Secondary | ICD-10-CM | POA: Diagnosis not present

## 2022-07-03 DIAGNOSIS — G8929 Other chronic pain: Secondary | ICD-10-CM | POA: Diagnosis not present

## 2022-07-03 DIAGNOSIS — L89309 Pressure ulcer of unspecified buttock, unspecified stage: Secondary | ICD-10-CM | POA: Diagnosis not present

## 2022-07-03 DIAGNOSIS — Z8673 Personal history of transient ischemic attack (TIA), and cerebral infarction without residual deficits: Secondary | ICD-10-CM

## 2022-07-03 DIAGNOSIS — E1169 Type 2 diabetes mellitus with other specified complication: Secondary | ICD-10-CM | POA: Diagnosis present

## 2022-07-03 DIAGNOSIS — I209 Angina pectoris, unspecified: Secondary | ICD-10-CM | POA: Diagnosis not present

## 2022-07-03 DIAGNOSIS — A419 Sepsis, unspecified organism: Principal | ICD-10-CM | POA: Diagnosis present

## 2022-07-03 DIAGNOSIS — M1990 Unspecified osteoarthritis, unspecified site: Secondary | ICD-10-CM | POA: Diagnosis not present

## 2022-07-03 DIAGNOSIS — I120 Hypertensive chronic kidney disease with stage 5 chronic kidney disease or end stage renal disease: Secondary | ICD-10-CM | POA: Diagnosis not present

## 2022-07-03 DIAGNOSIS — G4733 Obstructive sleep apnea (adult) (pediatric): Secondary | ICD-10-CM | POA: Diagnosis present

## 2022-07-03 DIAGNOSIS — E1151 Type 2 diabetes mellitus with diabetic peripheral angiopathy without gangrene: Secondary | ICD-10-CM | POA: Diagnosis not present

## 2022-07-03 DIAGNOSIS — M869 Osteomyelitis, unspecified: Secondary | ICD-10-CM | POA: Diagnosis present

## 2022-07-03 DIAGNOSIS — I70262 Atherosclerosis of native arteries of extremities with gangrene, left leg: Secondary | ICD-10-CM | POA: Diagnosis not present

## 2022-07-03 DIAGNOSIS — N319 Neuromuscular dysfunction of bladder, unspecified: Secondary | ICD-10-CM | POA: Diagnosis not present

## 2022-07-03 DIAGNOSIS — Z955 Presence of coronary angioplasty implant and graft: Secondary | ICD-10-CM

## 2022-07-03 DIAGNOSIS — L97929 Non-pressure chronic ulcer of unspecified part of left lower leg with unspecified severity: Secondary | ICD-10-CM | POA: Diagnosis not present

## 2022-07-03 DIAGNOSIS — I739 Peripheral vascular disease, unspecified: Secondary | ICD-10-CM | POA: Diagnosis not present

## 2022-07-03 DIAGNOSIS — M86272 Subacute osteomyelitis, left ankle and foot: Secondary | ICD-10-CM | POA: Diagnosis present

## 2022-07-03 DIAGNOSIS — E785 Hyperlipidemia, unspecified: Secondary | ICD-10-CM | POA: Diagnosis not present

## 2022-07-03 DIAGNOSIS — R110 Nausea: Secondary | ICD-10-CM | POA: Diagnosis present

## 2022-07-03 DIAGNOSIS — Z87891 Personal history of nicotine dependence: Secondary | ICD-10-CM

## 2022-07-03 DIAGNOSIS — E1165 Type 2 diabetes mellitus with hyperglycemia: Secondary | ICD-10-CM | POA: Diagnosis not present

## 2022-07-03 DIAGNOSIS — G629 Polyneuropathy, unspecified: Secondary | ICD-10-CM | POA: Diagnosis not present

## 2022-07-03 DIAGNOSIS — Z9049 Acquired absence of other specified parts of digestive tract: Secondary | ICD-10-CM

## 2022-07-03 DIAGNOSIS — I700 Atherosclerosis of aorta: Secondary | ICD-10-CM | POA: Diagnosis not present

## 2022-07-03 DIAGNOSIS — D631 Anemia in chronic kidney disease: Secondary | ICD-10-CM | POA: Diagnosis not present

## 2022-07-03 DIAGNOSIS — Z833 Family history of diabetes mellitus: Secondary | ICD-10-CM

## 2022-07-03 DIAGNOSIS — R2681 Unsteadiness on feet: Secondary | ICD-10-CM | POA: Diagnosis not present

## 2022-07-03 DIAGNOSIS — Z6839 Body mass index (BMI) 39.0-39.9, adult: Secondary | ICD-10-CM | POA: Diagnosis not present

## 2022-07-03 DIAGNOSIS — I5032 Chronic diastolic (congestive) heart failure: Secondary | ICD-10-CM | POA: Diagnosis not present

## 2022-07-03 DIAGNOSIS — R109 Unspecified abdominal pain: Secondary | ICD-10-CM | POA: Diagnosis not present

## 2022-07-03 DIAGNOSIS — E1122 Type 2 diabetes mellitus with diabetic chronic kidney disease: Secondary | ICD-10-CM | POA: Diagnosis present

## 2022-07-03 DIAGNOSIS — R0789 Other chest pain: Secondary | ICD-10-CM | POA: Diagnosis not present

## 2022-07-03 DIAGNOSIS — E1142 Type 2 diabetes mellitus with diabetic polyneuropathy: Secondary | ICD-10-CM | POA: Diagnosis present

## 2022-07-03 DIAGNOSIS — M79605 Pain in left leg: Secondary | ICD-10-CM | POA: Diagnosis not present

## 2022-07-03 DIAGNOSIS — N2581 Secondary hyperparathyroidism of renal origin: Secondary | ICD-10-CM | POA: Diagnosis present

## 2022-07-03 DIAGNOSIS — M898X9 Other specified disorders of bone, unspecified site: Secondary | ICD-10-CM | POA: Diagnosis present

## 2022-07-03 DIAGNOSIS — I6522 Occlusion and stenosis of left carotid artery: Secondary | ICD-10-CM | POA: Diagnosis present

## 2022-07-03 DIAGNOSIS — Z743 Need for continuous supervision: Secondary | ICD-10-CM | POA: Diagnosis not present

## 2022-07-03 DIAGNOSIS — N186 End stage renal disease: Secondary | ICD-10-CM | POA: Diagnosis present

## 2022-07-03 DIAGNOSIS — R159 Full incontinence of feces: Secondary | ICD-10-CM | POA: Diagnosis present

## 2022-07-03 DIAGNOSIS — I2510 Atherosclerotic heart disease of native coronary artery without angina pectoris: Secondary | ICD-10-CM | POA: Diagnosis not present

## 2022-07-03 DIAGNOSIS — Z91041 Radiographic dye allergy status: Secondary | ICD-10-CM

## 2022-07-03 DIAGNOSIS — Z9713 Presence of artificial right leg (complete) (partial): Secondary | ICD-10-CM

## 2022-07-03 DIAGNOSIS — I482 Chronic atrial fibrillation, unspecified: Secondary | ICD-10-CM | POA: Diagnosis not present

## 2022-07-03 DIAGNOSIS — I4510 Unspecified right bundle-branch block: Secondary | ICD-10-CM | POA: Diagnosis not present

## 2022-07-03 DIAGNOSIS — Z83438 Family history of other disorder of lipoprotein metabolism and other lipidemia: Secondary | ICD-10-CM

## 2022-07-03 DIAGNOSIS — H4311 Vitreous hemorrhage, right eye: Secondary | ICD-10-CM | POA: Diagnosis not present

## 2022-07-03 DIAGNOSIS — I10 Essential (primary) hypertension: Secondary | ICD-10-CM | POA: Diagnosis not present

## 2022-07-03 DIAGNOSIS — Z800 Family history of malignant neoplasm of digestive organs: Secondary | ICD-10-CM

## 2022-07-03 DIAGNOSIS — K219 Gastro-esophageal reflux disease without esophagitis: Secondary | ICD-10-CM | POA: Diagnosis present

## 2022-07-03 DIAGNOSIS — Z89512 Acquired absence of left leg below knee: Secondary | ICD-10-CM

## 2022-07-03 DIAGNOSIS — L97526 Non-pressure chronic ulcer of other part of left foot with bone involvement without evidence of necrosis: Secondary | ICD-10-CM | POA: Diagnosis not present

## 2022-07-03 DIAGNOSIS — Z794 Long term (current) use of insulin: Secondary | ICD-10-CM

## 2022-07-03 DIAGNOSIS — R739 Hyperglycemia, unspecified: Secondary | ICD-10-CM | POA: Diagnosis not present

## 2022-07-03 DIAGNOSIS — E113512 Type 2 diabetes mellitus with proliferative diabetic retinopathy with macular edema, left eye: Secondary | ICD-10-CM | POA: Diagnosis not present

## 2022-07-03 DIAGNOSIS — Z7901 Long term (current) use of anticoagulants: Secondary | ICD-10-CM

## 2022-07-03 DIAGNOSIS — R079 Chest pain, unspecified: Secondary | ICD-10-CM | POA: Diagnosis not present

## 2022-07-03 DIAGNOSIS — M6281 Muscle weakness (generalized): Secondary | ICD-10-CM | POA: Diagnosis not present

## 2022-07-03 DIAGNOSIS — M8660 Other chronic osteomyelitis, unspecified site: Secondary | ICD-10-CM | POA: Diagnosis not present

## 2022-07-03 DIAGNOSIS — J449 Chronic obstructive pulmonary disease, unspecified: Secondary | ICD-10-CM | POA: Diagnosis not present

## 2022-07-03 DIAGNOSIS — Z96659 Presence of unspecified artificial knee joint: Secondary | ICD-10-CM | POA: Diagnosis present

## 2022-07-03 DIAGNOSIS — L02612 Cutaneous abscess of left foot: Secondary | ICD-10-CM | POA: Diagnosis present

## 2022-07-03 DIAGNOSIS — G43909 Migraine, unspecified, not intractable, without status migrainosus: Secondary | ICD-10-CM | POA: Diagnosis not present

## 2022-07-03 DIAGNOSIS — Z91040 Latex allergy status: Secondary | ICD-10-CM

## 2022-07-03 DIAGNOSIS — Z79899 Other long term (current) drug therapy: Secondary | ICD-10-CM

## 2022-07-03 DIAGNOSIS — Z4781 Encounter for orthopedic aftercare following surgical amputation: Secondary | ICD-10-CM | POA: Diagnosis not present

## 2022-07-03 DIAGNOSIS — Z981 Arthrodesis status: Secondary | ICD-10-CM

## 2022-07-03 DIAGNOSIS — Z8249 Family history of ischemic heart disease and other diseases of the circulatory system: Secondary | ICD-10-CM

## 2022-07-03 LAB — CBC
HCT: 28.9 % — ABNORMAL LOW (ref 39.0–52.0)
Hemoglobin: 9.9 g/dL — ABNORMAL LOW (ref 13.0–17.0)
MCH: 31.6 pg (ref 26.0–34.0)
MCHC: 34.3 g/dL (ref 30.0–36.0)
MCV: 92.3 fL (ref 80.0–100.0)
Platelets: 213 10*3/uL (ref 150–400)
RBC: 3.13 MIL/uL — ABNORMAL LOW (ref 4.22–5.81)
RDW: 14 % (ref 11.5–15.5)
WBC: 6.2 10*3/uL (ref 4.0–10.5)
nRBC: 0 % (ref 0.0–0.2)

## 2022-07-03 LAB — COMPREHENSIVE METABOLIC PANEL
ALT: 6 U/L (ref 0–44)
AST: 13 U/L — ABNORMAL LOW (ref 15–41)
Albumin: 2.9 g/dL — ABNORMAL LOW (ref 3.5–5.0)
Alkaline Phosphatase: 122 U/L (ref 38–126)
Anion gap: 14 (ref 5–15)
BUN: 48 mg/dL — ABNORMAL HIGH (ref 8–23)
CO2: 24 mmol/L (ref 22–32)
Calcium: 7.3 mg/dL — ABNORMAL LOW (ref 8.9–10.3)
Chloride: 96 mmol/L — ABNORMAL LOW (ref 98–111)
Creatinine, Ser: 8.52 mg/dL — ABNORMAL HIGH (ref 0.61–1.24)
GFR, Estimated: 6 mL/min — ABNORMAL LOW (ref >60)
Glucose, Bld: 463 mg/dL — ABNORMAL HIGH (ref 70–99)
Potassium: 4.2 mmol/L (ref 3.5–5.1)
Sodium: 134 mmol/L — ABNORMAL LOW (ref 135–145)
Total Bilirubin: 0.4 mg/dL (ref 0.3–1.2)
Total Protein: 5.9 g/dL — ABNORMAL LOW (ref 6.5–8.1)

## 2022-07-03 LAB — URINALYSIS, ROUTINE W REFLEX MICROSCOPIC
Bacteria, UA: NONE SEEN
Bilirubin Urine: NEGATIVE
Glucose, UA: >=500 mg/dL — AB
Ketones, ur: NEGATIVE mg/dL
Leukocytes,Ua: NEGATIVE
Nitrite: NEGATIVE
Protein, ur: 30 mg/dL — AB
Specific Gravity, Urine: 1.009 (ref 1.005–1.030)
pH: 8 (ref 5.0–8.0)

## 2022-07-03 LAB — CBG MONITORING, ED
Glucose-Capillary: 457 mg/dL — ABNORMAL HIGH (ref 70–99)
Glucose-Capillary: 303 mg/dL — ABNORMAL HIGH (ref 70–99)
Glucose-Capillary: 216 mg/dL — ABNORMAL HIGH (ref 70–99)

## 2022-07-03 LAB — TROPONIN I (HIGH SENSITIVITY)
Troponin I (High Sensitivity): 20 ng/L — ABNORMAL HIGH (ref <18)
Troponin I (High Sensitivity): 22 ng/L — ABNORMAL HIGH (ref <18)

## 2022-07-03 MED ORDER — OXYCODONE-ACETAMINOPHEN 5-325 MG PO TABS
1.0000 | ORAL_TABLET | Freq: Once | ORAL | Status: AC
  Administered 2022-07-03: 1 via ORAL
  Filled 2022-07-03: qty 1

## 2022-07-03 MED ORDER — CEFAZOLIN SODIUM-DEXTROSE 2-4 GM/100ML-% IV SOLN
2.0000 g | Freq: Four times a day (QID) | INTRAVENOUS | Status: DC
  Administered 2022-07-03: 2 g via INTRAVENOUS
  Filled 2022-07-03: qty 100

## 2022-07-03 MED ORDER — INSULIN ASPART 100 UNIT/ML IJ SOLN
0.0000 [IU] | Freq: Three times a day (TID) | INTRAMUSCULAR | Status: DC
  Administered 2022-07-04: 6 [IU] via SUBCUTANEOUS
  Administered 2022-07-04: 3 [IU] via SUBCUTANEOUS
  Administered 2022-07-05 (×2): 4 [IU] via SUBCUTANEOUS
  Administered 2022-07-05: 2 [IU] via SUBCUTANEOUS
  Administered 2022-07-06: 1 [IU] via SUBCUTANEOUS
  Administered 2022-07-06 (×2): 2 [IU] via SUBCUTANEOUS
  Administered 2022-07-07: 1 [IU] via SUBCUTANEOUS
  Administered 2022-07-07: 2 [IU] via SUBCUTANEOUS
  Administered 2022-07-08: 1 [IU] via SUBCUTANEOUS
  Administered 2022-07-08: 3 [IU] via SUBCUTANEOUS
  Administered 2022-07-08: 4 [IU] via SUBCUTANEOUS
  Administered 2022-07-09 – 2022-07-10 (×3): 1 [IU] via SUBCUTANEOUS
  Administered 2022-07-10 – 2022-07-11 (×2): 2 [IU] via SUBCUTANEOUS
  Administered 2022-07-11: 4 [IU] via SUBCUTANEOUS
  Administered 2022-07-12: 2 [IU] via SUBCUTANEOUS
  Administered 2022-07-12 – 2022-07-14 (×4): 1 [IU] via SUBCUTANEOUS
  Administered 2022-07-15: 3 [IU] via SUBCUTANEOUS
  Administered 2022-07-15: 0 [IU] via SUBCUTANEOUS
  Administered 2022-07-16: 3 [IU] via SUBCUTANEOUS
  Administered 2022-07-16 (×2): 1 [IU] via SUBCUTANEOUS

## 2022-07-03 MED ORDER — INSULIN GLARGINE-YFGN 100 UNIT/ML ~~LOC~~ SOLN
20.0000 [IU] | Freq: Every day | SUBCUTANEOUS | Status: DC
  Administered 2022-07-03 – 2022-07-04 (×2): 20 [IU] via SUBCUTANEOUS
  Filled 2022-07-03 (×3): qty 0.2

## 2022-07-03 MED ORDER — CEFAZOLIN SODIUM-DEXTROSE 1-4 GM/50ML-% IV SOLN
1.0000 g | INTRAVENOUS | Status: AC
  Administered 2022-07-04: 1 g via INTRAVENOUS
  Filled 2022-07-03: qty 50

## 2022-07-03 NOTE — Inpatient Diabetes Management (Signed)
Inpatient Diabetes Program Recommendations  AACE/ADA: New Consensus Statement on Inpatient Glycemic Control (2015)  Target Ranges:  Prepandial:   less than 140 mg/dL      Peak postprandial:   less than 180 mg/dL (1-2 hours)      Critically ill patients:  140 - 180 mg/dL   Lab Results  Component Value Date   GLUCAP 457 (H) 07/03/2022   HGBA1C 7.6 (H) 01/11/2022    Review of Glycemic Control  Diabetes history: DM 2 Outpatient Diabetes medications: Lantus 30 units qhs, Humalog 4 units tid Current orders for Inpatient glycemic control:  In ED being evaluated  A1c 7.6% on 01/11/2022 Glucose 457 on presentation  Inpatient Diabetes Program Recommendations:    -   A1c level to assess glucose control over the past 2-3 months -   Semglee 20 units -   Novolog 0-15 units tid + hs -   Novolog 4 units tid meal coverage  Thanks,  Tama Headings RN, MSN, BC-ADM Inpatient Diabetes Coordinator Team Pager [phone] (8a-5p)

## 2022-07-03 NOTE — Telephone Encounter (Signed)
See below

## 2022-07-03 NOTE — ED Provider Notes (Signed)
[place] EMERGENCY DEPARTMENT Provider Note   CSN: [id number] Arrival date & time: 07/03/22  1206     History  Chief Complaint  Patient presents with   Chest Pain    [name] is a 66 y.o. male with multiple medical issues including hypertension, diabetes, ESRD on dialysis brought in by [facility] for evaluation of chest pain and weakness.  States that he has been feeling weak, nausea after being discharged from sepsis last week.  Patient was admitted to the hospital on October 14 due to sepsis from a wound on his left foot.  Reports 3 episodes of bowel incontinence at home with loose stools.  He has a left below-knee amputation scheduled for tomorrow.  This morning around 6:00 am he developed left-sided chest pain.  He described the pain as dull, pressure-like, intermittent, nonradiating.  Patient is on dialysis and is due for dialysis today.  He is wearing nitro patch and fentanyl patch.  Denies any fever, vomiting, constipation, rash.   Chest Pain      Home Medications Prior to Admission medications   Medication Sig Start Date End Date Taking? Authorizing Provider  amiodarone (PACERONE) 200 MG tablet Take 0.5 tablets (100 mg total) by mouth daily. 04/27/22   Adrian Prows, MD  amLODipine (NORVASC) 5 MG tablet Take 5 mg by mouth daily as needed for dizziness. Take one tablet for BP > 160 mm Hg 01/30/21   [provider]  apixaban (ELIQUIS) 5 MG TABS tablet Take 1 tablet (5 mg total) by mouth 2 (two) times daily. 01/14/22 07/03/22  Mercy Riding, MD  atorvastatin (LIPITOR) 40 MG tablet Take 40 mg by mouth at bedtime.     [provider]  B Complex-C-Zn-Folic Acid (DIALYVITE/ZINC) TABS Take 1 tablet by mouth daily. 10/04/21   [provider]  ceFAZolin (ANCEF) IVPB Inject 2 g into the vein Every Tuesday,Thursday,and Saturday with dialysis for 25 days. Indication:  GBS bacteremia  First Dose: Yes Last Day of Therapy:  07/23/22 Labs - Once  weekly:  CBC/D and BMP, Labs - Every other week:  ESR and CRP Method of administration: IV Push Method of administration may be changed at the discretion of home infusion pharmacist based upon assessment of the patient and/or caregiver's ability to self-administer the medication ordered. 06/28/22 07/23/22  Rosiland Oz, MD  famotidine (PEPCID) 20 MG tablet Take 20 mg by mouth every other day.    [provider]  fentaNYL (DURAGESIC) 50 MCG/HR Place 1 patch onto the skin every 3 (three) days. 09/05/21   Sharen Hones, MD  folic acid (FOLVITE) 1 MG tablet Take 1 mg by mouth daily. 09/18/21   [provider]  gentamicin (GARAMYCIN) IVPB Inject 100 mg into the vein Every Tuesday,Thursday,and Saturday with dialysis for 14 days. Indication:  GBS AV/MV IE - synergy Last Day of Therapy:  07/12/22 Labs - Sunday/Monday:  CBC/D, BMP, and gentamicin trough. Labs - Thursday:  BMP and gentamicin trough Labs - Every other week:  ESR and CRP Method of administration: Elastomeric Method of administration may be changed at the discretion of home infusion pharmacist based upon assessment of the patient and/or caregiver's ability to self-administer the medication ordered. 06/28/22 07/12/22  Rosiland Oz, MD  Glucosamine HCl (GLUCOSAMINE PO) Take 1 tablet by mouth 2 (two) times daily.    [provider]  insulin glargine (LANTUS) 100 UNIT/ML injection Inject 0.2 mLs (20 Units total) into the skin at bedtime. Patient taking differently: Inject 30  Units into the skin at bedtime. 06/28/22   [name], MD  insulin lispro (HUMALOG) 100 UNIT/ML injection Inject 4 Units into the skin 3 (three) times daily before meals. Sliding scale    [provider]  ketoconazole (NIZORAL) 2 % cream Apply 1 application topically 2 (two) times daily as needed for irritation.  03/07/18   [provider]  lidocaine-prilocaine (EMLA) cream Apply 1 application topically See admin  instructions. Prior to Dialysis days Tuesday,Thursday and saturday 06/06/21   [provider]  linaclotide Rolan Lipa) 145 MCG CAPS capsule Take 145 mcg by mouth daily as needed (constipation).     [provider]  loratadine (CLARITIN) 10 MG tablet Take 10 mg by mouth daily.    [provider]  metoCLOPramide (REGLAN) 5 MG tablet Take 1 tablet (5 mg total) by mouth 4 (four) times daily -  before meals and at bedtime. Patient taking differently: Take 5 mg by mouth 2 (two) times daily. 01/14/22 07/13/22  Mercy Riding, MD  midodrine (PROAMATINE) 10 MG tablet Take 1 tablet (10 mg total) by mouth every other day. One hour before dialysis Patient taking differently: Take 10 mg by mouth Every Tuesday,Thursday,and Saturday with dialysis. One hour before dialysis 09/07/21   Barb Merino, MD  NEEDLE, REUSABLE, 22 G 22G X 1-1/2" MISC 1 Units by Does not apply route as directed. For B12 IM inj 02/15/18   Elgergawy, Silver Huguenin, MD  nitroGLYCERIN (NITRODUR - DOSED IN MG/24 HR) 0.2 mg/hr patch Place 1 patch (0.2 mg total) onto the skin daily. 05/21/22   Newt Minion, MD  nitroGLYCERIN (NITROSTAT) 0.4 MG SL tablet Place 0.4 mg under the tongue every 5 (five) minutes as needed for chest pain. 01/06/22   [provider]  NOVOLOG FLEXPEN 100 UNIT/ML FlexPen Inject 4 Units into the skin 3 (three) times daily. Patient not taking: Reported on 07/03/2022 06/28/22   [name], MD  omega-3 acid ethyl esters (LOVAZA) 1 g capsule Take 2 g by mouth 2 (two) times daily. Mini    [provider]  ondansetron (ZOFRAN-ODT) 4 MG disintegrating tablet Take 1 tablet by mouth 4 (four) times daily as needed for nausea/vomiting. 05/28/22   [provider]  oxyCODONE-acetaminophen (PERCOCET) 7.5-325 MG tablet Take 1 tablet by mouth 3 (three) times daily as needed for moderate pain. Patient taking differently: Take 1 tablet by mouth every 8 (eight) hours. 09/05/21   Sharen Hones, MD   pantoprazole (PROTONIX) 40 MG tablet Take 1 tablet (40 mg total) by mouth 2 (two) times daily for 30 days, THEN 1 tablet (40 mg total) daily. Patient taking differently:  1 tablet (40 mg total) daily. 01/14/22 07/03/22  Mercy Riding, MD  Polyvinyl Alcohol-Povidone PF 1.4-0.6 % SOLN Place 1 drop into both eyes 2 (two) times daily as needed (dry eyes).     [provider]  pregabalin (LYRICA) 100 MG capsule Take 100 mg by mouth 2 (two) times daily. 09/18/21   [provider]  rOPINIRole (REQUIP) 2 MG tablet Take 2 mg by mouth at bedtime.    [provider]  silver sulfADIAZINE (SILVADENE) 1 % cream Apply topically daily. Patient taking differently: Apply 1 Application topically daily as needed ([place]). 01/14/22   Mercy Riding, MD  Syringe/Needle, Disp, (SYRINGE 3CC/22GX1-1/2") 22G X 1-1/2" 3 ML MISC 1 Syringe by Does not apply route as directed. For b12 IM inj 02/15/18   Elgergawy, Silver Huguenin, MD  tamsulosin [facility])  0.4 MG CAPS capsule Take 0.4 mg by mouth daily.    [provider]      Allergies    Contrast media [iodinated contrast media], Ivp dye [iodinated contrast media], Adhesive [tape], and Latex    Review of Systems   Review of Systems  Cardiovascular:  Positive for chest pain.    Physical Exam Updated Vital Signs BP (!) 203/89 (BP Location: Right Arm)   Pulse 85   Temp 98.8 F (37.1 C) (Oral)   Resp 18   SpO2 100%  Physical Exam Vitals and nursing note reviewed.  Constitutional:      Appearance: Normal appearance.  HENT:     Head: Normocephalic and atraumatic.     Mouth/Throat:     Mouth: Mucous membranes are moist.  Eyes:     General: No scleral icterus. Cardiovascular:     Rate and Rhythm: Normal rate and regular rhythm.     Pulses: Normal pulses.     Heart sounds: Normal heart sounds.  Pulmonary:     Effort: Pulmonary effort is normal.     Breath sounds: Normal breath sounds.  Chest:     Chest wall: Tenderness present.  Abdominal:      General: Abdomen is flat.     Palpations: Abdomen is soft.     Tenderness: There is no abdominal tenderness.  Musculoskeletal:        General: No deformity.     Comments: TTP to upper and lower back  Skin:    General: Skin is warm.     Findings: No rash.  Neurological:     General: No focal deficit present.     Mental Status: He is alert.  Psychiatric:        Mood and Affect: Mood normal.     ED Results / Procedures / Treatments   Labs (all labs ordered are listed, but only abnormal results are displayed) Labs Reviewed  CBG MONITORING, ED - Abnormal; Notable for the following components:      Result Value   Glucose-Capillary 457 (*)    All other components within normal limits  CBC  COMPREHENSIVE METABOLIC PANEL  URINALYSIS, ROUTINE W REFLEX MICROSCOPIC  TROPONIN I (HIGH SENSITIVITY)    EKG EKG Interpretation  Date/Time:  Tuesday July 03 2022 12:15:17 EDT Ventricular Rate:  87 PR Interval:  256 QRS Duration: 148 QT Interval:  426 QTC Calculation: 513 R Axis:   -58 Text Interpretation: Sinus rhythm Prolonged PR interval RBBB and LAFB No significant change since last tracing Confirmed by Regan Lemming ([id number]) on 07/03/2022 1:00:07 PM  Radiology CT ABDOMEN PELVIS WO CONTRAST  Result Date: 07/03/2022 CLINICAL DATA:  Left leg wound, sepsis, chest pain, epigastric pain EXAM: CT ABDOMEN AND PELVIS WITHOUT CONTRAST TECHNIQUE: Multidetector CT imaging of the abdomen and pelvis was performed following the standard protocol without IV contrast. Unenhanced CT was performed per clinician order. Lack of IV contrast limits sensitivity and specificity, especially for evaluation of abdominal/pelvic solid viscera. RADIATION DOSE REDUCTION: This exam was performed according to the departmental dose-optimization program which includes automated exposure control, adjustment of the mA and/or kV according to patient size and/or use of iterative reconstruction technique. COMPARISON:   06/23/2022, 06/13/2021 FINDINGS: Lower chest: No acute pleural or parenchymal lung disease. Hepatobiliary: Cholecystectomy. Unremarkable unenhanced appearance of the liver. No biliary duct dilation. Pancreas: Stable 1.7 cm cystic region within the pancreatic body reference image 35/3, unchanged since 2020. Otherwise unremarkable unenhanced appearance. Spleen: Unremarkable unenhanced appearance. Adrenals/Urinary Tract: Bilateral  renal atrophy consistent with end-stage renal disease. No acute renal abnormalities. No urinary tract calculi or obstructive uropathy. The adrenals and bladder are unremarkable. Stomach/Bowel: No bowel obstruction or ileus. Postsurgical changes from prior sigmoid colon resection and reanastomosis. Normal appendix right lower quadrant. Diverticulosis of the descending colon without evidence of acute diverticulitis. No bowel wall thickening or inflammatory change. Vascular/Lymphatic: Diffuse atherosclerosis of the aorta and its branches. No pathologic adenopathy within the abdomen or pelvis. Reactive inguinal lymph nodes are again seen, left greater than right, decreased in prominence since prior study and likely related to the known left lower extremity ulceration and chronic left foot osteomyelitis described previously. Reproductive: Prostate is unremarkable. Other: No free fluid or free intraperitoneal gas. Small fat containing supraumbilical ventral hernia unchanged. Postsurgical changes from prior colostomy takedown in the right lower quadrant. No bowel herniation. Musculoskeletal: No acute or destructive bony lesions. Stable postsurgical changes spanning L4 through S1. IMPRESSION: 1. No acute intra-abdominal or intrapelvic process. 2. Descending colonic diverticulosis without diverticulitis. 3. Persistent but decreased reactive lymph nodes primarily in the left inguinal region, related to the patient's known chronic left lower extremity ulceration and osteomyelitis. 4. 1.7 cm cyst within  the pancreatic body, unchanged since 2022. If not previously evaluated, when the patient is clinically stable and able to follow directions and hold their breath (preferably as an outpatient) further evaluation with dedicated abdominal MRI should be considered. 5.  Aortic Atherosclerosis (ICD10-I70.0). Electronically Signed   By: Randa Ngo M.D.   On: 07/03/2022 16:33   DG Chest 2 View  Result Date: 07/03/2022 CLINICAL DATA:  Chest and abdominal pain EXAM: CHEST - 2 VIEW COMPARISON:  06/23/2022 FINDINGS: Cardiomegaly. Both lungs are clear. Disc degenerative disease of the thoracic spine. IMPRESSION: Cardiomegaly without acute abnormality of the lungs. Electronically Signed   By: Delanna Ahmadi M.D.   On: 07/03/2022 13:20    Procedures Procedures    Medications Ordered in ED Medications  ceFAZolin (ANCEF) IVPB 2g/100 mL premix (0 g Intravenous Stopped 07/03/22 1911)  insulin aspart (novoLOG) injection 0-6 Units (has no administration in time range)  insulin glargine-yfgn (SEMGLEE) injection 20 Units (has no administration in time range)  oxyCODONE-acetaminophen (PERCOCET/ROXICET) 5-325 MG per tablet 1 tablet (1 tablet Oral Given 07/03/22 1917)    ED Course/ Medical Decision Making/ A&P Clinical Course as of 07/03/22 1926  Tue Jul 03, 2022  1904 Glucose, UA(!): >=500 [KL]    Clinical Course User Index [KL] Rex Kras, PA                           Medical Decision Making Amount and/or Complexity of Data Reviewed Labs: ordered. Decision-making details documented in ED Course. Radiology: ordered.  Risk Prescription drug management. Decision regarding hospitalization.   This patient presents to the ED for concern of chest pain and abdominal pain, this involves an extensive number of treatment options, and is a complaint that carries with it a high risk of complications and morbidity.  The differential diagnosis includes ACS, pneumothorax, dissection, PE, musculoskeletal pain,  gastroenteritis. Co morbidities that complicate the patient evaluation  See HPI Additional history obtained:  Additional history obtained from EMR External records from outside source obtained and reviewed including Care Everywhere/External Records and Primary Care Documents Lab Tests:  I Ordered, and personally interpreted labs.  The pertinent results include:   No leukocytosis noted.  No evidence of acute anemia.  Platelets within normal range.   No electrolyte abnormalities  noted.  Renal function elevated BUN, creatinine.  No transaminitis noted.  First troponin elevated at 20.  Second troponin 22 CBG 457. Imaging Studies ordered:  I ordered imaging studies including: Chest x-ray which was negative.  CT abdomen pelvis without contrast. I independently visualized and interpreted imaging. I agree with the radiologist interpretation Cardiac Monitoring: / EKG:  The patient was maintained on a cardiac monitor.  I personally viewed and interpreted the cardiac monitored which showed an underlying rhythm of: sinus rhythm Consultations Obtained:  I requested consultation with orthopedic surgeon Dr. Lorin Mercy,  and discussed lab and imaging findings as well as pertinent plan - they recommend: we can send patient home if he is safe for discharge, patient can come in in the morning for the surgery with [name] and will be evaluated there. Problem List / ED Course / Critical interventions / Medication management  Chest pain, incontinence. Vitals signs  significant for blood pressure 170/76.  Otherwise within normal limits. Laboratory/imaging studies significant for: See above On physical examination, patient is afebrile and appears in no acute distress.  He does have tenderness to palpation to his upper abdomen and lower back.  No focal neurodeficit on exam.  He had 3 out of 5 strength on the left leg. Heart sound normal. Lungs are clear. EKG without ischemic changes. Repeated troponin without any  acute changes. Patient is appropriate for discharge as symptoms has improved. He requested admission as he has a surgery schedule for tomorrow and has difficulty with ambulation and transportation. I will discuss case with [name] 5:59 PM I got a call from [name] who is patient's surgeon for tomorrow procedure. He agreed to admit patient to his service on 5N. He recommended to give patient 2g of Ancef q6h and sliding scale insulin. 7:17 PM Insulin sliding scale ordered. Patient's presentations are most concerned for musculoskeletal pain, osteomyelitis. Low suspicion for ACS, PE, sepsis. I ordered medication including Percocet,  Ancef, insulin sliding scale. Reevaluation of the patient after these medicines showed that the patient improved I have reviewed the patients home medicines and have made adjustments as needed Social Determinants of Health:  N/A Test / Admission / Dispo - Considered:  Patient is admitted to [name] service for amputation surgery tomorrow.         Final Clinical Impression(s) / ED Diagnoses Final diagnoses:  None    Rx / DC Orders ED Discharge Orders     None         Rex Kras, [place] 07/03/22 2143    Regan Lemming, MD 07/04/22 [phone]

## 2022-07-03 NOTE — ED Notes (Signed)
Patient back from X-ray and on the monitor

## 2022-07-03 NOTE — Telephone Encounter (Signed)
Patient has questions about the surgery for tomorrow and would like for Dr [name] to give him a call before the surgery tomorrow.  [phone]

## 2022-07-03 NOTE — ED Notes (Signed)
Patient being transported to Xray

## 2022-07-03 NOTE — ED Triage Notes (Signed)
Patient bib [facility] from home with complaints of chest pain. Per ems patient was recently admitted due to sepsis. He has a wound on left leg and is supposed to have leg removed. Pt started having crushing chest pain this morning. EMS gave 325 aspirin. Pt is wearing nitro patch and fentanyl patch. VSS.Patient is also on dialysis and is due for dialysis today. BG for ems was reading high.

## 2022-07-03 NOTE — Progress Notes (Addendum)
PHARMACY NOTE:  ANTIMICROBIAL RENAL DOSAGE ADJUSTMENT  Current antimicrobial regimen includes a mismatch between antimicrobial dosage and estimated renal function.  As per policy approved by the Pharmacy & Therapeutics and Medical Executive Committees, the antimicrobial dosage will be adjusted accordingly.  Current antimicrobial dosage:  cefazolin 2g IV q6 hours  Indication: GBS 2/2 osteomyelitis, concern for endocarditis (continued from PTA abx orders)  Renal Function:  Estimated Creatinine Clearance: 9.6 mL/min (A) (by C-G formula based on SCr of 8.52 mg/dL (H)). '[x]'$      ESRD on HD TTS '[]'$      On CRRT    Antimicrobial dosage has been changed to:  cefazolin 1g IV q24 hours  Additional comments: can transition to cefazolin 2g IV qHD session once back on HD schedule   Thank you for allowing pharmacy to be a part of this patient's care.  Dimple Nanas, PharmD, BCPS 07/03/2022 7:54 PM

## 2022-07-04 ENCOUNTER — Inpatient Hospital Stay (HOSPITAL_COMMUNITY): Payer: Medicare HMO | Admitting: Certified Registered Nurse Anesthetist

## 2022-07-04 ENCOUNTER — Encounter (HOSPITAL_COMMUNITY)
Admission: EM | Disposition: A | Discharge status: Skilled Nursing Facility | Payer: Self-pay | Source: Home / Self Care | Attending: Orthopedic Surgery

## 2022-07-04 ENCOUNTER — Encounter (HOSPITAL_COMMUNITY): Payer: Self-pay | Admitting: Orthopedic Surgery

## 2022-07-04 ENCOUNTER — Inpatient Hospital Stay (HOSPITAL_COMMUNITY): Admission: RE | Admit: 2022-07-04 | Payer: Medicare HMO | Source: Home / Self Care | Admitting: Orthopedic Surgery

## 2022-07-04 ENCOUNTER — Other Ambulatory Visit: Payer: Self-pay

## 2022-07-04 DIAGNOSIS — E1151 Type 2 diabetes mellitus with diabetic peripheral angiopathy without gangrene: Secondary | ICD-10-CM

## 2022-07-04 DIAGNOSIS — I10 Essential (primary) hypertension: Secondary | ICD-10-CM

## 2022-07-04 DIAGNOSIS — M86272 Subacute osteomyelitis, left ankle and foot: Secondary | ICD-10-CM | POA: Diagnosis not present

## 2022-07-04 DIAGNOSIS — E1169 Type 2 diabetes mellitus with other specified complication: Secondary | ICD-10-CM

## 2022-07-04 DIAGNOSIS — I2510 Atherosclerotic heart disease of native coronary artery without angina pectoris: Secondary | ICD-10-CM | POA: Diagnosis not present

## 2022-07-04 DIAGNOSIS — R079 Chest pain, unspecified: Secondary | ICD-10-CM

## 2022-07-04 DIAGNOSIS — R0789 Other chest pain: Secondary | ICD-10-CM

## 2022-07-04 DIAGNOSIS — M869 Osteomyelitis, unspecified: Secondary | ICD-10-CM | POA: Diagnosis not present

## 2022-07-04 DIAGNOSIS — Z87891 Personal history of nicotine dependence: Secondary | ICD-10-CM

## 2022-07-04 DIAGNOSIS — Z89512 Acquired absence of left leg below knee: Secondary | ICD-10-CM

## 2022-07-04 HISTORY — PX: AMPUTATION: SHX166

## 2022-07-04 LAB — CBC
HCT: 27.7 % — ABNORMAL LOW (ref 39.0–52.0)
Hemoglobin: 9.7 g/dL — ABNORMAL LOW (ref 13.0–17.0)
MCH: 31.7 pg (ref 26.0–34.0)
MCHC: 35 g/dL (ref 30.0–36.0)
MCV: 90.5 fL (ref 80.0–100.0)
Platelets: 219 10*3/uL (ref 150–400)
RBC: 3.06 MIL/uL — ABNORMAL LOW (ref 4.22–5.81)
RDW: 13.8 % (ref 11.5–15.5)
WBC: 8.9 10*3/uL (ref 4.0–10.5)
nRBC: 0 % (ref 0.0–0.2)

## 2022-07-04 LAB — GLUCOSE, CAPILLARY
Glucose-Capillary: 162 mg/dL — ABNORMAL HIGH (ref 70–99)
Glucose-Capillary: 182 mg/dL — ABNORMAL HIGH (ref 70–99)
Glucose-Capillary: 268 mg/dL — ABNORMAL HIGH (ref 70–99)
Glucose-Capillary: 426 mg/dL — ABNORMAL HIGH (ref 70–99)

## 2022-07-04 LAB — RENAL FUNCTION PANEL
Albumin: 2.7 g/dL — ABNORMAL LOW (ref 3.5–5.0)
Anion gap: 13 (ref 5–15)
BUN: 49 mg/dL — ABNORMAL HIGH (ref 8–23)
CO2: 21 mmol/L — ABNORMAL LOW (ref 22–32)
Calcium: 6.9 mg/dL — ABNORMAL LOW (ref 8.9–10.3)
Chloride: 100 mmol/L (ref 98–111)
Creatinine, Ser: 8.63 mg/dL — ABNORMAL HIGH (ref 0.61–1.24)
GFR, Estimated: 6 mL/min — ABNORMAL LOW (ref >60)
Glucose, Bld: 274 mg/dL — ABNORMAL HIGH (ref 70–99)
Phosphorus: 6.6 mg/dL — ABNORMAL HIGH (ref 2.5–4.6)
Potassium: 4.6 mmol/L (ref 3.5–5.1)
Sodium: 134 mmol/L — ABNORMAL LOW (ref 135–145)

## 2022-07-04 LAB — GLUCOSE, RANDOM: Glucose, Bld: 530 mg/dL (ref 70–99)

## 2022-07-04 LAB — HEPATITIS B SURFACE ANTIGEN: Hepatitis B Surface Ag: NONREACTIVE

## 2022-07-04 SURGERY — AMPUTATION BELOW KNEE
Anesthesia: General | Site: Knee | Laterality: Left

## 2022-07-04 MED ORDER — GUAIFENESIN-DM 100-10 MG/5ML PO SYRP: 15.0000 mL | ORAL_SOLUTION | ORAL | Status: DC | PRN

## 2022-07-04 MED ORDER — POTASSIUM CHLORIDE CRYS ER 20 MEQ PO TBCR: 20.0000 meq | EXTENDED_RELEASE_TABLET | Freq: Every day | ORAL | Status: DC | PRN

## 2022-07-04 MED ORDER — ACETAMINOPHEN 160 MG/5ML PO SOLN: 325.0000 mg | ORAL | Status: DC | PRN

## 2022-07-04 MED ORDER — LIDOCAINE 2% (20 MG/ML) 5 ML SYRINGE
INTRAMUSCULAR | Status: DC | PRN
  Administered 2022-07-04: 40 mg via INTRAVENOUS

## 2022-07-04 MED ORDER — ONDANSETRON HCL 4 MG/2ML IJ SOLN: 4.0000 mg | Freq: Four times a day (QID) | INTRAMUSCULAR | Status: DC | PRN

## 2022-07-04 MED ORDER — POLYETHYLENE GLYCOL 3350 17 G PO PACK: 17.0000 g | PACK | Freq: Every day | ORAL | Status: DC | PRN

## 2022-07-04 MED ORDER — ROPIVACAINE HCL 5 MG/ML IJ SOLN
INTRAMUSCULAR | Status: DC | PRN
  Administered 2022-07-04: 15 mL via PERINEURAL

## 2022-07-04 MED ORDER — ALUM & MAG HYDROXIDE-SIMETH 200-200-20 MG/5ML PO SUSP: 15.0000 mL | ORAL | Status: DC | PRN

## 2022-07-04 MED ORDER — OXYCODONE HCL 5 MG PO TABS: 5.0000 mg | ORAL_TABLET | Freq: Once | ORAL | Status: DC | PRN

## 2022-07-04 MED ORDER — PROMETHAZINE HCL 25 MG/ML IJ SOLN: 6.2500 mg | INTRAMUSCULAR | Status: DC | PRN

## 2022-07-04 MED ORDER — POLYVINYL ALCOHOL 1.4 % OP SOLN: 1.0000 [drp] | Freq: Two times a day (BID) | OPHTHALMIC | Status: DC | PRN

## 2022-07-04 MED ORDER — FENTANYL CITRATE (PF) 100 MCG/2ML IJ SOLN
INTRAMUSCULAR | Status: AC
  Filled 2022-07-04: qty 2

## 2022-07-04 MED ORDER — INSULIN ASPART 100 UNIT/ML IJ SOLN
3.0000 [IU] | Freq: Three times a day (TID) | INTRAMUSCULAR | Status: DC
  Administered 2022-07-04 – 2022-07-16 (×29): 3 [IU] via SUBCUTANEOUS

## 2022-07-04 MED ORDER — FENTANYL CITRATE (PF) 250 MCG/5ML IJ SOLN
INTRAMUSCULAR | Status: DC | PRN
  Administered 2022-07-04 (×2): 50 ug via INTRAVENOUS

## 2022-07-04 MED ORDER — AMLODIPINE BESYLATE 5 MG PO TABS: 5.0000 mg | ORAL_TABLET | Freq: Every day | ORAL | Status: DC | PRN

## 2022-07-04 MED ORDER — CEFAZOLIN SODIUM-DEXTROSE 2-3 GM-%(50ML) IV SOLR
INTRAVENOUS | Status: DC | PRN
  Administered 2022-07-04: 2 g via INTRAVENOUS

## 2022-07-04 MED ORDER — ACETAMINOPHEN 325 MG PO TABS
325.0000 mg | ORAL_TABLET | Freq: Four times a day (QID) | ORAL | Status: DC | PRN
  Administered 2022-07-08 – 2022-07-13 (×2): 650 mg via ORAL
  Filled 2022-07-04 (×2): qty 2

## 2022-07-04 MED ORDER — VITAMIN C 500 MG PO TABS: 1000.0000 mg | ORAL_TABLET | Freq: Every day | ORAL | Status: DC

## 2022-07-04 MED ORDER — BUPIVACAINE-EPINEPHRINE (PF) 0.5% -1:200000 IJ SOLN
INTRAMUSCULAR | Status: DC | PRN
  Administered 2022-07-04: 30 mL via PERINEURAL

## 2022-07-04 MED ORDER — DOCUSATE SODIUM 100 MG PO CAPS
100.0000 mg | ORAL_CAPSULE | Freq: Every day | ORAL | Status: DC
  Administered 2022-07-05 – 2022-07-14 (×7): 100 mg via ORAL
  Filled 2022-07-04 (×11): qty 1

## 2022-07-04 MED ORDER — EPHEDRINE SULFATE-NACL 50-0.9 MG/10ML-% IV SOSY
PREFILLED_SYRINGE | INTRAVENOUS | Status: DC | PRN
  Administered 2022-07-04: 5 mg via INTRAVENOUS

## 2022-07-04 MED ORDER — MAGNESIUM SULFATE 2 GM/50ML IV SOLN: 2.0000 g | Freq: Every day | INTRAVENOUS | Status: DC | PRN

## 2022-07-04 MED ORDER — LABETALOL HCL 5 MG/ML IV SOLN: 10.0000 mg | INTRAVENOUS | Status: DC | PRN

## 2022-07-04 MED ORDER — ACETAMINOPHEN 10 MG/ML IV SOLN
INTRAVENOUS | Status: AC
  Filled 2022-07-04: qty 100

## 2022-07-04 MED ORDER — OXYCODONE HCL 5 MG PO TABS
10.0000 mg | ORAL_TABLET | ORAL | Status: DC | PRN
  Administered 2022-07-04: 15 mg via ORAL
  Administered 2022-07-04: 10 mg via ORAL
  Administered 2022-07-04: 15 mg via ORAL
  Administered 2022-07-05: 10 mg via ORAL
  Administered 2022-07-05 – 2022-07-06 (×6): 15 mg via ORAL
  Administered 2022-07-07: 10 mg via ORAL
  Administered 2022-07-07 (×2): 15 mg via ORAL
  Administered 2022-07-08: 10 mg via ORAL
  Administered 2022-07-08 (×2): 15 mg via ORAL
  Administered 2022-07-09: 10 mg via ORAL
  Administered 2022-07-09: 15 mg via ORAL
  Administered 2022-07-10 – 2022-07-11 (×2): 10 mg via ORAL
  Filled 2022-07-04 (×5): qty 3
  Filled 2022-07-04: qty 2
  Filled 2022-07-04: qty 3
  Filled 2022-07-04: qty 2
  Filled 2022-07-04 (×9): qty 3

## 2022-07-04 MED ORDER — MIDODRINE HCL 5 MG PO TABS
10.0000 mg | ORAL_TABLET | ORAL | Status: DC
  Administered 2022-07-05 – 2022-07-14 (×4): 10 mg via ORAL
  Filled 2022-07-04 (×5): qty 2

## 2022-07-04 MED ORDER — PHENYLEPHRINE 80 MCG/ML (10ML) SYRINGE FOR IV PUSH (FOR BLOOD PRESSURE SUPPORT)
PREFILLED_SYRINGE | INTRAVENOUS | Status: DC | PRN
  Administered 2022-07-04: 240 ug via INTRAVENOUS
  Administered 2022-07-04 (×2): 160 ug via INTRAVENOUS

## 2022-07-04 MED ORDER — ACETAMINOPHEN 325 MG PO TABS: 325.0000 mg | ORAL_TABLET | ORAL | Status: DC | PRN

## 2022-07-04 MED ORDER — HYDRALAZINE HCL 20 MG/ML IJ SOLN
INTRAMUSCULAR | Status: AC
  Filled 2022-07-04: qty 1

## 2022-07-04 MED ORDER — ATORVASTATIN CALCIUM 40 MG PO TABS
40.0000 mg | ORAL_TABLET | Freq: Every day | ORAL | Status: DC
  Administered 2022-07-04 – 2022-07-16 (×13): 40 mg via ORAL
  Filled 2022-07-04 (×13): qty 1

## 2022-07-04 MED ORDER — INSULIN ASPART 100 UNIT/ML IJ SOLN: 0.0000 [IU] | Freq: Three times a day (TID) | INTRAMUSCULAR | Status: DC

## 2022-07-04 MED ORDER — BUPIVACAINE-EPINEPHRINE (PF) 0.5% -1:200000 IJ SOLN: INTRAMUSCULAR | Status: DC | PRN

## 2022-07-04 MED ORDER — CEFAZOLIN SODIUM-DEXTROSE 2-4 GM/100ML-% IV SOLN
2.0000 g | INTRAVENOUS | Status: DC
  Administered 2022-07-05 – 2022-07-14 (×5): 2 g via INTRAVENOUS
  Filled 2022-07-04 (×6): qty 100

## 2022-07-04 MED ORDER — SODIUM CHLORIDE 0.9 % IV SOLN: INTRAVENOUS | Status: DC

## 2022-07-04 MED ORDER — DEXAMETHASONE SODIUM PHOSPHATE 10 MG/ML IJ SOLN
INTRAMUSCULAR | Status: DC | PRN
  Administered 2022-07-04: 4 mg via INTRAVENOUS

## 2022-07-04 MED ORDER — GENTAMICIN IN SALINE 1-0.9 MG/ML-% IV SOLN
100.0000 mg | INTRAVENOUS | Status: AC
  Administered 2022-07-05 – 2022-07-12 (×4): 100 mg via INTRAVENOUS
  Filled 2022-07-04 (×7): qty 100

## 2022-07-04 MED ORDER — MIDAZOLAM HCL 2 MG/2ML IJ SOLN
INTRAMUSCULAR | Status: AC
  Filled 2022-07-04: qty 2

## 2022-07-04 MED ORDER — AMIODARONE HCL 200 MG PO TABS
100.0000 mg | ORAL_TABLET | Freq: Every day | ORAL | Status: DC
  Administered 2022-07-04 – 2022-07-16 (×12): 100 mg via ORAL
  Filled 2022-07-04 (×12): qty 1

## 2022-07-04 MED ORDER — AMISULPRIDE (ANTIEMETIC) 5 MG/2ML IV SOLN: 10.0000 mg | Freq: Once | INTRAVENOUS | Status: DC | PRN

## 2022-07-04 MED ORDER — DARBEPOETIN ALFA 100 MCG/0.5ML IJ SOSY: 100.0000 ug | PREFILLED_SYRINGE | INTRAMUSCULAR | Status: DC

## 2022-07-04 MED ORDER — TAMSULOSIN HCL 0.4 MG PO CAPS
0.4000 mg | ORAL_CAPSULE | Freq: Every day | ORAL | Status: DC
  Administered 2022-07-04 – 2022-07-16 (×12): 0.4 mg via ORAL
  Filled 2022-07-04 (×12): qty 1

## 2022-07-04 MED ORDER — ACETAMINOPHEN 10 MG/ML IV SOLN
1000.0000 mg | Freq: Once | INTRAVENOUS | Status: DC | PRN
  Administered 2022-07-04: 1000 mg via INTRAVENOUS

## 2022-07-04 MED ORDER — JUVEN PO PACK
1.0000 | PACK | Freq: Two times a day (BID) | ORAL | Status: DC
  Administered 2022-07-04 – 2022-07-16 (×16): 1 via ORAL
  Filled 2022-07-04 (×12): qty 1

## 2022-07-04 MED ORDER — MAGNESIUM CITRATE PO SOLN: 1.0000 | Freq: Once | ORAL | Status: DC | PRN

## 2022-07-04 MED ORDER — FUROSEMIDE 10 MG/ML IJ SOLN
60.0000 mg | Freq: Once | INTRAMUSCULAR | Status: AC
  Administered 2022-07-04: 60 mg via INTRAVENOUS
  Filled 2022-07-04: qty 6

## 2022-07-04 MED ORDER — PHENOL 1.4 % MT LIQD: 1.0000 | OROMUCOSAL | Status: DC | PRN

## 2022-07-04 MED ORDER — CHLORHEXIDINE GLUCONATE CLOTH 2 % EX PADS
6.0000 | MEDICATED_PAD | Freq: Every day | CUTANEOUS | Status: DC
  Administered 2022-07-06 – 2022-07-16 (×8): 6 via TOPICAL

## 2022-07-04 MED ORDER — APIXABAN 5 MG PO TABS
5.0000 mg | ORAL_TABLET | Freq: Two times a day (BID) | ORAL | Status: DC
  Administered 2022-07-04 – 2022-07-16 (×24): 5 mg via ORAL
  Filled 2022-07-04 (×24): qty 1

## 2022-07-04 MED ORDER — OXYCODONE HCL 5 MG PO TABS
5.0000 mg | ORAL_TABLET | ORAL | Status: DC | PRN
  Administered 2022-07-08 – 2022-07-09 (×2): 5 mg via ORAL
  Filled 2022-07-04: qty 2
  Filled 2022-07-04: qty 1
  Filled 2022-07-04: qty 2
  Filled 2022-07-04: qty 1
  Filled 2022-07-04 (×2): qty 2
  Filled 2022-07-04: qty 1

## 2022-07-04 MED ORDER — ONDANSETRON HCL 4 MG/2ML IJ SOLN
INTRAMUSCULAR | Status: DC | PRN
  Administered 2022-07-04: 4 mg via INTRAVENOUS

## 2022-07-04 MED ORDER — OXYCODONE HCL 5 MG/5ML PO SOLN: 5.0000 mg | Freq: Once | ORAL | Status: DC | PRN

## 2022-07-04 MED ORDER — ALPRAZOLAM 0.5 MG PO TABS
0.5000 mg | ORAL_TABLET | Freq: Three times a day (TID) | ORAL | Status: DC | PRN
  Administered 2022-07-04 – 2022-07-15 (×7): 0.5 mg via ORAL
  Filled 2022-07-04 (×7): qty 1

## 2022-07-04 MED ORDER — HYDRALAZINE HCL 20 MG/ML IJ SOLN
5.0000 mg | INTRAMUSCULAR | Status: DC | PRN
  Administered 2022-07-04: 5 mg via INTRAVENOUS

## 2022-07-04 MED ORDER — HYDROMORPHONE HCL 1 MG/ML IJ SOLN
0.5000 mg | INTRAMUSCULAR | Status: DC | PRN
  Administered 2022-07-05 – 2022-07-06 (×2): 1 mg via INTRAVENOUS
  Filled 2022-07-04 (×2): qty 1

## 2022-07-04 MED ORDER — PANTOPRAZOLE SODIUM 40 MG PO TBEC
40.0000 mg | DELAYED_RELEASE_TABLET | Freq: Every day | ORAL | Status: DC
  Administered 2022-07-04 – 2022-07-16 (×12): 40 mg via ORAL
  Filled 2022-07-04 (×12): qty 1

## 2022-07-04 MED ORDER — ROPINIROLE HCL 0.5 MG PO TABS
2.0000 mg | ORAL_TABLET | Freq: Every day | ORAL | Status: DC
  Administered 2022-07-04 – 2022-07-16 (×13): 2 mg via ORAL
  Filled 2022-07-04 (×13): qty 4

## 2022-07-04 MED ORDER — BISACODYL 5 MG PO TBEC: 5.0000 mg | DELAYED_RELEASE_TABLET | Freq: Every day | ORAL | Status: DC | PRN

## 2022-07-04 MED ORDER — 0.9 % SODIUM CHLORIDE (POUR BTL) OPTIME
TOPICAL | Status: DC | PRN
  Administered 2022-07-04: 1000 mL

## 2022-07-04 MED ORDER — FENTANYL CITRATE (PF) 100 MCG/2ML IJ SOLN: 25.0000 ug | INTRAMUSCULAR | Status: DC | PRN

## 2022-07-04 MED ORDER — SODIUM CHLORIDE 0.9 % IV SOLN: INTRAVENOUS | Status: DC | PRN

## 2022-07-04 MED ORDER — METOPROLOL TARTRATE 5 MG/5ML IV SOLN: 2.0000 mg | INTRAVENOUS | Status: DC | PRN

## 2022-07-04 MED ORDER — MIDAZOLAM HCL 2 MG/2ML IJ SOLN
INTRAMUSCULAR | Status: DC | PRN
  Administered 2022-07-04: 2 mg via INTRAVENOUS

## 2022-07-04 MED ORDER — PREGABALIN 100 MG PO CAPS
100.0000 mg | ORAL_CAPSULE | Freq: Two times a day (BID) | ORAL | Status: DC
  Administered 2022-07-04 – 2022-07-16 (×25): 100 mg via ORAL
  Filled 2022-07-04 (×25): qty 1

## 2022-07-04 MED ORDER — GENTAMICIN IV (FOR PTA / DISCHARGE USE ONLY): 100.0000 mg | INTRAVENOUS | Status: DC

## 2022-07-04 MED ORDER — ZINC SULFATE 220 (50 ZN) MG PO CAPS
220.0000 mg | ORAL_CAPSULE | Freq: Every day | ORAL | Status: DC
  Administered 2022-07-04 – 2022-07-16 (×12): 220 mg via ORAL
  Filled 2022-07-04 (×12): qty 1

## 2022-07-04 MED ORDER — PROPOFOL 10 MG/ML IV BOLUS
INTRAVENOUS | Status: DC | PRN
  Administered 2022-07-04: 140 mg via INTRAVENOUS

## 2022-07-04 MED ORDER — CEFAZOLIN IV (FOR PTA / DISCHARGE USE ONLY): 2.0000 g | INTRAVENOUS | Status: DC

## 2022-07-04 SURGICAL SUPPLY — 41 items
BAG COUNTER SPONGE SURGICOUNT (BAG) IMPLANT
BAG SPNG CNTER NS LX DISP (BAG)
BLADE SAW RECIP 87.9 MT (BLADE) ×2 IMPLANT
BLADE SURG 21 STRL SS (BLADE) ×2 IMPLANT
BNDG COHESIVE 6X5 TAN STRL LF (GAUZE/BANDAGES/DRESSINGS) IMPLANT
CANISTER WOUND CARE 500ML ATS (WOUND CARE) ×2 IMPLANT
CANISTER WOUNDNEG PRESSURE 500 (CANNISTER) IMPLANT
COVER SURGICAL LIGHT HANDLE (MISCELLANEOUS) ×2 IMPLANT
CUFF TOURN SGL QUICK 34 (TOURNIQUET CUFF) ×1
CUFF TRNQT CYL 34X4.125X (TOURNIQUET CUFF) ×2 IMPLANT
DRAPE DERMATAC (DRAPES) IMPLANT
DRAPE INCISE IOBAN 66X45 STRL (DRAPES) ×2 IMPLANT
DRAPE U-SHAPE 47X51 STRL (DRAPES) ×2 IMPLANT
DRESSING PREVENA PLUS CUSTOM (GAUZE/BANDAGES/DRESSINGS) ×2 IMPLANT
DRSG PREVENA PLUS CUSTOM (GAUZE/BANDAGES/DRESSINGS) ×1
DURAPREP 26ML APPLICATOR (WOUND CARE) ×2 IMPLANT
ELECT REM PT RETURN 9FT ADLT (ELECTROSURGICAL) ×1
ELECTRODE REM PT RTRN 9FT ADLT (ELECTROSURGICAL) ×2 IMPLANT
GLOVE BIOGEL PI IND STRL 9 (GLOVE) ×2 IMPLANT
GLOVE SURG ORTHO 9.0 STRL STRW (GLOVE) ×2 IMPLANT
GOWN STRL REUS W/ TWL XL LVL3 (GOWN DISPOSABLE) ×4 IMPLANT
GOWN STRL REUS W/TWL XL LVL3 (GOWN DISPOSABLE) ×3
GRAFT SKIN WND OMEGA3 SB 7X10 (Tissue) IMPLANT
GRAFT SKIN WND SURGICLOSE M95 (Tissue) IMPLANT
KIT BASIN OR (CUSTOM PROCEDURE TRAY) ×2 IMPLANT
KIT TURNOVER KIT B (KITS) ×2 IMPLANT
MANIFOLD NEPTUNE II (INSTRUMENTS) ×2 IMPLANT
NS IRRIG 1000ML POUR BTL (IV SOLUTION) ×2 IMPLANT
PACK ORTHO EXTREMITY (CUSTOM PROCEDURE TRAY) ×2 IMPLANT
PAD ARMBOARD 7.5X6 YLW CONV (MISCELLANEOUS) ×2 IMPLANT
PREVENA RESTOR ARTHOFORM 46X30 (CANNISTER) ×2 IMPLANT
SPONGE T-LAP 18X18 ~~LOC~~+RFID (SPONGE) IMPLANT
STAPLER VISISTAT 35W (STAPLE) IMPLANT
STOCKINETTE IMPERVIOUS LG (DRAPES) ×2 IMPLANT
SUT ETHILON 2 0 PSLX (SUTURE) IMPLANT
SUT SILK 2 0 (SUTURE) ×1
SUT SILK 2-0 18XBRD TIE 12 (SUTURE) ×2 IMPLANT
SUT VIC AB 1 CTX 27 (SUTURE) ×4 IMPLANT
TOWEL GREEN STERILE (TOWEL DISPOSABLE) ×2 IMPLANT
TUBE CONNECTING 12X1/4 (SUCTIONS) ×2 IMPLANT
YANKAUER SUCT BULB TIP NO VENT (SUCTIONS) ×2 IMPLANT

## 2022-07-04 NOTE — Progress Notes (Signed)
Patients BS 426. MD made aware, still awating response. Insulin given as ordered see MAR. Patient remains A/Ox4. Labs still pending for glucose random.

## 2022-07-04 NOTE — H&P (Signed)
[name] is an 65 y.o. male.   Chief Complaint: Chest pain with abscess osteomyelitis left foot. HPI: Patient was admitted to the hospital last week for sepsis secondary to the abscess and osteomyelitis left foot.  Patient was discharged to home and patient presented to the emergency room with chest pain.  Work-up was negative for cardiac event.  Past Medical History:  Diagnosis Date   Carotid artery occlusion 11/10/10   LEFT CAROTID ENDARTERECTOMY   Chronic kidney disease    Complication of anesthesia    BP WENT UP AT [facility] "   COPD (chronic obstructive pulmonary disease) ([place])    pt denies this dx as of 06/01/20 - no inhaler    Diabetes mellitus without complication ([place])    Diverticulitis    Diverticulosis of colon (without mention of hemorrhage)    DJD (degenerative joint disease)    knees/hands/feet/back/neck   Fatty liver    Full dentures    GERD (gastroesophageal reflux disease)    H/O hiatal hernia    History of blood transfusion    with a past surical procedure per patient 06/01/20   Hyperlipidemia    Hypertension    Neuromuscular disorder ([place])    peripheral neuropathy   Non-pressure chronic ulcer of other part of left foot limited to breakdown of skin ([place]) 11/12/2016   Osteomyelitis (H[facility])    left 5th metatarsal   PAD (peripheral artery disease) ([place])    Distal aortogram June 2012. Atherectomy left popliteal artery July 2012.    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   Unstable angina ([place]) 09/16/2018   Wears glasses     Past Surgical History:  Procedure Laterality Date   ABDOMINAL AORTOGRAM W/LOWER EXTREMITY N/A 06/23/2021   Procedure: ABDOMINAL AORTOGRAM W/LOWER EXTREMITY;  Surgeon: Nigel Mormon, MD;  Location: [place] [facility];  Service: Cardiovascular;  Laterality: N/A;   AMPUTATION  11/05/2011   Procedure: AMPUTATION RAY;   Surgeon: Wylene Simmer, MD;  Location: [place];  Service: Orthopedics;  Laterality: Right;  Amputation of Right 4&5th Toes   AMPUTATION Left 11/26/2012   Procedure: AMPUTATION RAY;  Surgeon: Wylene Simmer, MD;  Location: [place];  Service: Orthopedics;  Laterality: Left;  fourth ray amputation   AMPUTATION Right 08/27/2014   Procedure: Transmetatarsal Amputation;  Surgeon: Newt Minion, MD;  Location: [place];  Service: Orthopedics;  Laterality: Right;   AMPUTATION Right 01/14/2015   Procedure: AMPUTATION BELOW KNEE;  Surgeon: Newt Minion, MD;  Location: [place];  Service: Orthopedics;  Laterality: Right;   AMPUTATION Left 10/21/2015   Procedure: Left Foot 5th Ray Amputation;  Surgeon: Newt Minion, MD;  Location: [place];  Service: Orthopedics;  Laterality: Left;   ANTERIOR FUSION CERVICAL SPINE  02/06/06   C4-5, C5-6, C6-7; SURGEON DR. MAX COHEN   AV FISTULA PLACEMENT Left 06/02/2020   Procedure: ARTERIOVENOUS (AV) FISTULA CREATION LEFT;  Surgeon: Waynetta Sandy, MD;  Location: [place];  Service: Vascular;  Laterality: Left;   BACK SURGERY     x 3   [place] Left 07/21/2020   Procedure: LEFT UPPER ARM ATERIOVENOUS SUPERFISTULALIZATION;  Surgeon: Waynetta Sandy, MD;  Location: [place];  Service: Vascular;  Laterality: Left;   BELOW KNEE LEG AMPUTATION Right    CARDIAC CATHETERIZATION  10/31/04   2009  CAROTID ENDARTERECTOMY  11/10/10   CAROTID ENDARTERECTOMY Left 11/10/2010   Subtotal occlusion of left internal carotid artery with left hemispheric transient ischemic attacks.   CAROTID STENT     CARPAL TUNNEL RELEASE Right 10/21/2013   Procedure: RIGHT CARPAL TUNNEL RELEASE;  Surgeon: Wynonia Sours, MD;  Location: [place];  Service: Orthopedics;  Laterality: Right;   CHOLECYSTECTOMY     COLON SURGERY     COLONOSCOPY     COLOSTOMY REVERSAL  05/21/2018   ileostomy reversal   CYSTOSCOPY WITH STENT PLACEMENT Bilateral 01/13/2018   Procedure: CYSTOSCOPY WITH  BILATERAL URETERAL CATHETER PLACEMENT;  Surgeon: Ardis Hughs, MD;  Location[facility];  Service: Urology;  Laterality: Bilateral;   ESOPHAGEAL MANOMETRY Bilateral 07/19/2014   Procedure: ESOPHAGEAL MANOMETRY (EM);  Surgeon: Jerene Bears, MD;  [facility];  Service: Gastroenterology;  Laterality: Bilateral;   EYE SURGERY Bilateral 2020   cataract   FEMORAL ARTERY STENT     x6   FINGER SURGERY     FOOT SURGERY  04/25/2016    EXCISION BASE 5TH METATARSAL AND PARTIAL CUBOID LEFT FOOT   HERNIA REPAIR     LEFT INGUINAL AND UMBILICAL REPAIRS   HERNIA REPAIR     I & D EXTREMITY Left 04/25/2016   Procedure: EXCISION BASE 5TH METATARSAL AND PARTIAL CUBOID LEFT FOOT;  Surgeon: Newt Minion, MD;  Location: [facility];  Service: Orthopedics;  Laterality: Left;   ILEOSTOMY  01/13/2018   Procedure: ILEOSTOMY;  Surgeon: Clovis Riley, MD;  [facility];  Service: General;;   ILEOSTOMY CLOSURE N/A 05/21/2018   Procedure: ILEOSTOMY REVERSAL ERAS PATHWAY;  Surgeon: Clovis Riley, MD;  Location: [place];  Service: General;  Laterality: N/A;   IR RADIOLOGIST EVAL & MGMT  11/19/2017   IR RADIOLOGIST EVAL & MGMT  12/03/2017   IR RADIOLOGIST EVAL & MGMT  12/18/2017   JOINT REPLACEMENT Right 2001   Total knee   LAMINECTOMY     X 3 LUMBAR AND X 2 CERVICAL SPINE OPERATIONS   LAPAROSCOPIC CHOLECYSTECTOMY W/ CHOLANGIOGRAPHY  11/09/04   SURGEON DR. [place] CATH AND CORONARY ANGIOGRAPHY N/A 09/16/2018   Procedure: LEFT HEART CATH AND CORONARY ANGIOGRAPHY;  Surgeon: Nigel Mormon, MD;  Location: [facility];  Service: Cardiovascular;  Laterality: N/A;   LEFT HEART CATHETERIZATION WITH CORONARY ANGIOGRAM N/A 10/29/2014   Procedure: LEFT HEART CATHETERIZATION WITH CORONARY ANGIOGRAM;  Surgeon: Laverda Page, MD;  Location: [facility];  Service: Cardiovascular;  Laterality: N/A;   LIGATION OF COMPETING BRANCHES OF ARTERIOVENOUS FISTULA Left 07/21/2020   Procedure:  LIGATION OF COMPETING BRANCHES OF LEFT UPPER ARM ARTERIOVENOUS FISTULA;  Surgeon: Waynetta Sandy, MD;  Location: [place];  Service: Vascular;  Laterality: Left;   LOWER EXTREMITY ANGIOGRAM N/A 03/19/2012   Procedure: LOWER EXTREMITY ANGIOGRAM;  Surgeon: Burnell Blanks, MD;  Location: [facility];  Service: Cardiovascular;  Laterality: N/A;   LOWER EXTREMITY ANGIOGRAPHY N/A 06/20/2021   Procedure: LOWER EXTREMITY ANGIOGRAPHY;  Surgeon: Nigel Mormon, MD;  Location: [facility];  Service: Cardiovascular;  Laterality: N/A;   NECK SURGERY     PARTIAL COLECTOMY N/A 01/13/2018   Procedure: LAPAROSCOPIC ASSISTED   SIGMOID COLECTOMY ILEOSTOMY;  Surgeon: Clovis Riley, MD;  [facility];  Service: General;  Laterality: N/A;   PENILE PROSTHESIS IMPLANT  08/14/05   INFRAPUBIC INSERTION OF INFLATABLE PENILE PROSTHESIS; SURGEON DR. Amalia Hailey  PENILE PROSTHESIS IMPLANT     PERCUTANEOUS CORONARY STENT INTERVENTION (PCI-S) Right 10/29/2014   Procedure: PERCUTANEOUS CORONARY STENT INTERVENTION (PCI-S);  Surgeon: Laverda Page, MD;  Location: [facility] C[facility] LAB;  Service: Cardiovascular;  Laterality: Right;   PERIPHERAL VASCULAR INTERVENTION Left 06/23/2021   Procedure: PERIPHERAL VASCULAR INTERVENTION;  Surgeon: Nigel Mormon, MD;  Location: [facility];  Service: Cardiovascular;  Laterality: Left;   REMOVAL OF PENILE PROSTHESIS N/A 06/14/2021   Procedure: Removal of THREE piece inflatable penile prosthesis;  Surgeon: Lucas Mallow, MD;  Location: [place] [facility];  Service: Urology;  Laterality: N/A;   SHOULDER ARTHROSCOPY     SPINE SURGERY     TOE AMPUTATION Left    TONSILLECTOMY     TOTAL KNEE ARTHROPLASTY  07/2002   RIGHT KNEE ; SURGEON  DR. [name] ALSO HAD ARTHROSCOPIC RIGHT KNEE IN  10/2001   TOTAL KNEE ARTHROPLASTY     ULNAR NERVE TRANSPOSITION Right 10/21/2013   Procedure: RIGHT ELBOW  ULNAR NERVE DECOMPRESSION;  Surgeon: Wynonia Sours, MD;  Location: [place];  Service: Orthopedics;  Laterality: Right;    Family History  Problem Relation Age of Onset   Heart disease Father        Before age 53-  CAD, BPG   Diabetes Father        Amputation   Cancer Father        PROSTATE   Hyperlipidemia Father    Hypertension Father    Heart attack Father        Triple BPG   Varicose Veins Father    Cancer Sister        Breast   Hyperlipidemia Sister    Hypertension Sister    Heart attack Brother    Colon cancer Brother    Diabetes Brother    Heart disease Brother 82       A-Fib. Before age 80   Hyperlipidemia Brother    Hypertension Brother    Hypertension Son    Arthritis Other        GRANDMOTHER   Hypertension Other        OTHER FAMILY MEMBERS   Social History:  reports that he quit smoking about 10 years ago. His smoking use included cigarettes. He has a 70.00 pack-year smoking history. He has never used smokeless tobacco. He reports that he does not currently use alcohol. He reports that he does not use drugs.  Allergies:  Allergies  Allergen Reactions   Contrast Media [Iodinated Contrast Media] Shortness Of Breath and Other (See Comments)    Difficulty breathing and altered mental status     Ivp Dye [Iodinated Contrast Media] Anaphylaxis, Shortness Of Breath and Other (See Comments)    Breathing problems, altered mental state    Adhesive [Tape] Rash and Other (See Comments)    Rash after 1 day of use Do not leave on longer then 3 days with out be changed   Latex Rash and Other (See Comments)    A severe rash appears after the first 24 hours of being placed    (Not in a hospital admission)   Results for orders placed or performed during the hospital encounter of 07/03/22 (from the past 48 hour(s))  CBG monitoring, ED     Status: Abnormal   Collection Time: 07/03/22 12:15 PM  Result Value Ref Range   Glucose-Capillary 457 (H) 70 - 99 mg/dL    Comment: Glucose reference range applies only to samples  taken  after fasting for at least 8 hours.   Comment 1 Notify RN    Comment 2 Document in Chart   CBC     Status: Abnormal   Collection Time: 07/03/22 12:55 PM  Result Value Ref Range   WBC 6.2 4.0 - 10.5 K/uL   RBC 3.13 (L) 4.22 - 5.81 MIL/uL   Hemoglobin 9.9 (L) 13.0 - 17.0 g/dL   HCT 28.9 (L) 39.0 - 52.0 %   MCV 92.3 80.0 - 100.0 fL   MCH 31.6 26.0 - 34.0 pg   MCHC 34.3 30.0 - 36.0 g/dL   RDW 14.0 11.5 - 15.5 %   Platelets 213 150 - 400 K/uL   nRBC 0.0 0.0 - 0.2 %    Comment: Performed at [facility], [place] [address], [place], [place] [address]  Troponin I (High Sensitivity)     Status: Abnormal   Collection Time: 07/03/22 12:55 PM  Result Value Ref Range   Troponin I (High Sensitivity) 20 (H) <18 ng/L    Comment: (NOTE) Elevated high sensitivity troponin I (hsTnI) values and significant  changes across serial measurements may suggest ACS but many other  chronic and acute conditions are known to elevate hsTnI results.  Refer to the "Links" section for chest pain algorithms and additional  guidance. Performed at [facility], [place] [address], [place], [place] [address]   Comprehensive metabolic panel     Status: Abnormal   Collection Time: 07/03/22 12:55 PM  Result Value Ref Range   Sodium 134 (L) 135 - 145 mmol/L   Potassium 4.2 3.5 - 5.1 mmol/L   Chloride 96 (L) 98 - 111 mmol/L   CO2 24 22 - 32 mmol/L   Glucose, Bld 463 (H) 70 - 99 mg/dL    Comment: Glucose reference range applies only to samples taken after fasting for at least 8 hours.   BUN 48 (H) 8 - 23 mg/dL   Creatinine, Ser 8.52 (H) 0.61 - 1.24 mg/dL   Calcium 7.3 (L) 8.9 - 10.3 mg/dL   Total Protein 5.9 (L) 6.5 - 8.1 g/dL   Albumin 2.9 (L) 3.5 - 5.0 g/dL   AST 13 (L) 15 - 41 U/L   ALT 6 0 - 44 U/L   Alkaline Phosphatase 122 38 - 126 U/L   Total Bilirubin 0.4 0.3 - 1.2 mg/dL   GFR, Estimated 6 (L) >60 mL/min    Comment: (NOTE) Calculated using the CKD-EPI Creatinine Equation (2021)    Anion gap 14 5  - 15    Comment: Performed at [facility], [place] [address] [place], [place] [address]  Urinalysis, Routine w reflex microscopic Urine, Clean Catch     Status: Abnormal   Collection Time: 07/03/22  2:00 PM  Result Value Ref Range   Color, Urine STRAW (A) YELLOW   APPearance CLEAR CLEAR   Specific Gravity, Urine 1.009 1.005 - 1.030   pH 8.0 5.0 - 8.0   Glucose, UA >=500 (A) NEGATIVE mg/dL   Hgb urine dipstick SMALL (A) NEGATIVE   Bilirubin Urine NEGATIVE NEGATIVE   Ketones, ur NEGATIVE NEGATIVE mg/dL   Protein, ur 30 (A) NEGATIVE mg/dL   Nitrite NEGATIVE NEGATIVE   Leukocytes,Ua NEGATIVE NEGATIVE   RBC / HPF 0-5 0 - 5 RBC/hpf   WBC, UA 0-5 0 - 5 WBC/hpf   Bacteria, UA NONE SEEN NONE SEEN   Squamous Epithelial / LPF 0-5 0 - 5    Comment: Performed  at [facility], [place] [address] [place], [place] [address]  Troponin I (High Sensitivity)     Status: Abnormal   Collection Time: 07/03/22  2:38 PM  Result Value Ref Range   Troponin I (High Sensitivity) 22 (H) <18 ng/L    Comment: (NOTE) Elevated high sensitivity troponin I (hsTnI) values and significant  changes across serial measurements may suggest ACS but many other  chronic and acute conditions are known to elevate hsTnI results.  Refer to the "Links" section for chest pain algorithms and additional  guidance. Performed at [facility], [place] [address] [place], [place] [address]   POC CBG, ED     Status: Abnormal   Collection Time: 07/03/22  7:19 PM  Result Value Ref Range   Glucose-Capillary 303 (H) 70 - 99 mg/dL    Comment: Glucose reference range applies only to samples taken after fasting for at least 8 hours.  CBG monitoring, ED     Status: Abnormal   Collection Time: 07/03/22 10:55 PM  Result Value Ref Range   Glucose-Capillary 216 (H) 70 - 99 mg/dL    Comment: Glucose reference range applies only to samples taken after fasting for at least 8 hours.   *Note: Due to a large number of results and/or  encounters for the requested time period, some results have not been displayed. A complete set of results can be found in Results Review.   CT ABDOMEN PELVIS WO CONTRAST  Result Date: 07/03/2022 CLINICAL DATA:  Left leg wound, sepsis, chest pain, epigastric pain EXAM: CT ABDOMEN AND PELVIS WITHOUT CONTRAST TECHNIQUE: Multidetector CT imaging of the abdomen and pelvis was performed following the standard protocol without IV contrast. Unenhanced CT was performed per clinician order. Lack of IV contrast limits sensitivity and specificity, especially for evaluation of abdominal/pelvic solid viscera. RADIATION DOSE REDUCTION: This exam was performed according to the departmental dose-optimization program which includes automated exposure control, adjustment of the mA and/or kV according to patient size and/or use of iterative reconstruction technique. COMPARISON:  06/23/2022, 06/13/2021 FINDINGS: Lower chest: No acute pleural or parenchymal lung disease. Hepatobiliary: Cholecystectomy. Unremarkable unenhanced appearance of the liver. No biliary duct dilation. Pancreas: Stable 1.7 cm cystic region within the pancreatic body reference image 35/3, unchanged since 2020. Otherwise unremarkable unenhanced appearance. Spleen: Unremarkable unenhanced appearance. Adrenals/Urinary Tract: Bilateral renal atrophy consistent with end-stage renal disease. No acute renal abnormalities. No urinary tract calculi or obstructive uropathy. The adrenals and bladder are unremarkable. Stomach/Bowel: No bowel obstruction or ileus. Postsurgical changes from prior sigmoid colon resection and reanastomosis. Normal appendix right lower quadrant. Diverticulosis of the descending colon without evidence of acute diverticulitis. No bowel wall thickening or inflammatory change. Vascular/Lymphatic: Diffuse atherosclerosis of the aorta and its branches. No pathologic adenopathy within the abdomen or pelvis. Reactive inguinal lymph nodes are again  seen, left greater than right, decreased in prominence since prior study and likely related to the known left lower extremity ulceration and chronic left foot osteomyelitis described previously. Reproductive: Prostate is unremarkable. Other: No free fluid or free intraperitoneal gas. Small fat containing supraumbilical ventral hernia unchanged. Postsurgical changes from prior colostomy takedown in the right lower quadrant. No bowel herniation. Musculoskeletal: No acute or destructive bony lesions. Stable postsurgical changes spanning L4 through S1. IMPRESSION: 1. No acute intra-abdominal or intrapelvic process. 2. Descending colonic diverticulosis without diverticulitis. 3. Persistent but decreased reactive lymph nodes primarily in the left inguinal region, related to the patient's known chronic left lower extremity ulceration and  osteomyelitis. 4. 1.7 cm cyst within the pancreatic body, unchanged since 2022. If not previously evaluated, when the patient is clinically stable and able to follow directions and hold their breath (preferably as an outpatient) further evaluation with dedicated abdominal MRI should be considered. 5.  Aortic Atherosclerosis (ICD10-I70.0). Electronically Signed   By: Randa Ngo M.D.   On: 07/03/2022 16:33   DG Chest 2 View  Result Date: 07/03/2022 CLINICAL DATA:  Chest and abdominal pain EXAM: CHEST - 2 VIEW COMPARISON:  06/23/2022 FINDINGS: Cardiomegaly. Both lungs are clear. Disc degenerative disease of the thoracic spine. IMPRESSION: Cardiomegaly without acute abnormality of the lungs. Electronically Signed   By: Delanna Ahmadi M.D.   On: 07/03/2022 13:20    Review of Systems  All other systems reviewed and are negative.   Blood pressure (!) 156/62, pulse 65, temperature 98.9 F (37.2 C), resp. rate 14, SpO2 97 %. Physical Exam  Examination patient was alert oriented no adenopathy well-dressed normal affect normal respiratory effort.  Work-up for the chest pain shows  cardiomegaly without acute abnormalities of the lungs.  CT of the abdomen and pelvis showed no acute intra-abdominal or intrapelvic process.  Patient has ulceration over the lateral aspect of the left foot with MRI scan showing osteomyelitis. Assessment/Plan Assessment: Sepsis with osteomyelitis and abscess left foot.  Plan: Patient was admitted for IV antibiotics for systemic symptoms secondary to the abscess and osteomyelitis left foot.  Plan for admission IV antibiotics and plan for transtibial amputation.  Risks and benefits were discussed including persistent infection nonhealing the wound need for additional surgery.  Patient states he understands wished to proceed at this time.  Newt Minion, MD 07/04/2022, 6:43 AM

## 2022-07-04 NOTE — Transfer of Care (Addendum)
Immediate Anesthesia Transfer of Care Note  Patient: [name]  Procedure(s) Performed: LEFT BELOW KNEE AMPUTATION (Left: Knee)  Patient Location: PACU  Anesthesia Type:General and Regional  Level of Consciousness: drowsy and patient cooperative  Airway & Oxygen Therapy: Patient Spontanous Breathing  Post-op Assessment: Report given to RN, Post -op Vital signs reviewed and stable, and Patient moving all extremities X 4  Post vital signs: Reviewed and stable  Last Vitals:  Vitals Value Taken Time  BP 176/61 07/04/22 0902  Temp 36.3 C 07/04/22 0840  Pulse 68 07/04/22 0908  Resp 16 07/04/22 0908  SpO2 97 % 07/04/22 0908  Vitals shown include unvalidated device data.  Last Pain:  Vitals:   07/04/22 0845  TempSrc:   PainSc: Asleep         Complications: No notable events documented.

## 2022-07-04 NOTE — Anesthesia Preprocedure Evaluation (Signed)
Anesthesia Evaluation  Patient identified by MRN, date of birth, ID band Patient awake    Reviewed: Allergy & Precautions, NPO status , Patient's Chart, lab work & pertinent test results  Airway Mallampati: III  TM Distance: >3 FB Neck ROM: Full    Dental  (+) Upper Dentures, Dental Advisory Given   Pulmonary sleep apnea , COPD, former smoker,     + decreased breath sounds      Cardiovascular hypertension, + CAD, + Cardiac Stents and + Peripheral Vascular Disease  + dysrhythmias  Rhythm:Regular Rate:Normal  Echo: 1. Left ventricular ejection fraction, by estimation, is 55 to 60%. Left  ventricular ejection fraction by PLAX is 63 %. The left ventricle has  normal function. The left ventricle has no regional wall motion  abnormalities. There is mild left ventricular  hypertrophy. Left ventricular diastolic parameters are consistent with  Grade II diastolic dysfunction (pseudonormalization). Elevated left  ventricular end-diastolic pressure.  2. Right ventricular systolic function is normal. The right ventricular  size is normal.  3. Left atrial size was mildly dilated.  4. The mitral valve is degenerative. Trivial mitral valve regurgitation.  No evidence of mitral stenosis.  5. The aortic valve is tricuspid. There is mild calcification of the  aortic valve. There is mild thickening of the aortic valve. Aortic valve  regurgitation is not visualized. Mild aortic valve stenosis.    Neuro/Psych  Headaches,  Neuromuscular disease negative psych ROS   GI/Hepatic Neg liver ROS, hiatal hernia, GERD  ,  Endo/Other  diabetes  Renal/GU      Musculoskeletal  (+) Arthritis ,   Abdominal   Peds  Hematology   Anesthesia Other Findings   Reproductive/Obstetrics                             Anesthesia Physical Anesthesia Plan  ASA: 3  Anesthesia Plan: General   Post-op Pain Management: Regional  block*   Induction: Intravenous  PONV Risk Score and Plan: 3 and Ondansetron, Midazolam and Treatment may vary due to age or medical condition  Airway Management Planned: LMA  Additional Equipment: None  Intra-op Plan:   Post-operative Plan: Extubation in OR  Informed Consent: I have reviewed the patients History and Physical, chart, labs and discussed the procedure including the risks, benefits and alternatives for the proposed anesthesia with the patient or authorized representative who has indicated his/her understanding and acceptance.     Dental advisory given  Plan Discussed with: CRNA  Anesthesia Plan Comments:         Anesthesia Quick Evaluation

## 2022-07-04 NOTE — Consult Note (Signed)
[facility] Renal Consultation Note  Requesting MD: Meridee Score, MD Indication for Consultation:  ESRD  Chief complaint: leg amputation   HPI:  [name] is a 66 y.o. male with a history including ESRD, DM, HTN, PAD, and sleep apnea who presented to the hospital for chest pain and tightness.  "Not like an elephant".  He was recently discharged after treatment for an abscess and osteomyelitis of the left foot.  He had a planned amputation scheduled for the day after presentation to the ER.  Ortho was consulted and he underwent left BKA this am.  Nephrology is consulted for assistance with management of ESRD.  He had HD off schedule on Friday to allow him to attend his daughter's wedding this past Saturday.  He missed HD on Tuesday because he felt poorly and later presented to the ER.  He still makes quite a bit of urine.  He had fluids ordered per primary team.  Right leg prosthesis doesn't fit great - he states takes multiple (4-6 pair) of socks to wear it.  He has lost leg circumference since it was fitted.    PMHx:   Past Medical History:  Diagnosis Date   Carotid artery occlusion 11/10/10   LEFT CAROTID ENDARTERECTOMY   Chronic kidney disease    Complication of anesthesia    BP WENT UP AT [facility] "   COPD (chronic obstructive pulmonary disease) ([place])    pt denies this dx as of 06/01/20 - no inhaler    Diabetes mellitus without complication ([place])    Diverticulitis    Diverticulosis of colon (without mention of hemorrhage)    DJD (degenerative joint disease)    knees/hands/feet/back/neck   Fatty liver    Full dentures    GERD (gastroesophageal reflux disease)    H/O hiatal hernia    History of blood transfusion    with a past surical procedure per patient 06/01/20   Hyperlipidemia    Hypertension    Neuromuscular disorder ([place])    peripheral neuropathy   Non-pressure chronic ulcer of other part of left foot limited to breakdown of skin ([place]) 11/12/2016   Osteomyelitis  (HCC)    left 5th metatarsal   PAD (peripheral artery disease) ([place])    Distal aortogram June 2012. Atherectomy left popliteal artery July 2012.    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   Unstable angina ([place]) 09/16/2018   Wears glasses     Past Surgical History:  Procedure Laterality Date   ABDOMINAL AORTOGRAM W/LOWER EXTREMITY N/A 06/23/2021   Procedure: ABDOMINAL AORTOGRAM W/LOWER EXTREMITY;  Surgeon: Nigel Mormon, MD;  Location: [place] [facility];  Service: Cardiovascular;  Laterality: N/A;   AMPUTATION  11/05/2011   Procedure: AMPUTATION RAY;  Surgeon: Wylene Simmer, MD;  Location: [place];  Service: Orthopedics;  Laterality: Right;  Amputation of Right 4&5th Toes   AMPUTATION Left 11/26/2012   Procedure: AMPUTATION RAY;  Surgeon: Wylene Simmer, MD;  Location: [place];  Service: Orthopedics;  Laterality: Left;  fourth ray amputation   AMPUTATION Right 08/27/2014   Procedure: Transmetatarsal Amputation;  Surgeon: Newt Minion, MD;  Location: [place];  Service: Orthopedics;  Laterality: Right;   AMPUTATION Right 01/14/2015   Procedure: AMPUTATION BELOW KNEE;  Surgeon: Newt Minion, MD;  Location: [place];  Service: Orthopedics;  Laterality: Right;   AMPUTATION Left 10/21/2015   Procedure: Left Foot 5th Ray Amputation;  Surgeon: Newt Minion, MD;  Location: [place];  Service: Orthopedics;  Laterality: Left;   ANTERIOR FUSION CERVICAL SPINE  02/06/06   C4-5, C5-6, C6-7; SURGEON DR. MAX COHEN   AV FISTULA PLACEMENT Left 06/02/2020   Procedure: ARTERIOVENOUS (AV) FISTULA CREATION LEFT;  Surgeon: Waynetta Sandy, MD;  Location: [place];  Service: Vascular;  Laterality: Left;   BACK SURGERY     x 3   [place] Left 07/21/2020   Procedure: LEFT UPPER ARM ATERIOVENOUS SUPERFISTULALIZATION;  Surgeon: Waynetta Sandy, MD;  Location: [place];   Service: Vascular;  Laterality: Left;   BELOW KNEE LEG AMPUTATION Right    CARDIAC CATHETERIZATION  10/31/04   2009   CAROTID ENDARTERECTOMY  11/10/10   CAROTID ENDARTERECTOMY Left 11/10/2010   Subtotal occlusion of left internal carotid artery with left hemispheric transient ischemic attacks.   CAROTID STENT     CARPAL TUNNEL RELEASE Right 10/21/2013   Procedure: RIGHT CARPAL TUNNEL RELEASE;  Surgeon: Wynonia Sours, MD;  Location: [place];  Service: Orthopedics;  Laterality: Right;   CHOLECYSTECTOMY     COLON SURGERY     COLONOSCOPY     COLOSTOMY REVERSAL  05/21/2018   ileostomy reversal   CYSTOSCOPY WITH STENT PLACEMENT Bilateral 01/13/2018   Procedure: CYSTOSCOPY WITH BILATERAL URETERAL CATHETER PLACEMENT;  Surgeon: Ardis Hughs, MD;  Location: [facility];  Service: Urology;  Laterality: Bilateral;   ESOPHAGEAL MANOMETRY Bilateral 07/19/2014   Procedure: ESOPHAGEAL MANOMETRY (EM);  Surgeon: Jerene Bears, MD;  [facility];  Service: Gastroenterology;  Laterality: Bilateral;   EYE SURGERY Bilateral 2020   cataract   FEMORAL ARTERY STENT     x6   FINGER SURGERY     FOOT SURGERY  04/25/2016    EXCISION BASE 5TH METATARSAL AND PARTIAL CUBOID LEFT FOOT   HERNIA REPAIR     LEFT INGUINAL AND UMBILICAL REPAIRS   HERNIA REPAIR     I & D EXTREMITY Left 04/25/2016   Procedure: EXCISION BASE 5TH METATARSAL AND PARTIAL CUBOID LEFT FOOT;  Surgeon: Newt Minion, MD;  Location: [place];  Service: Orthopedics;  Laterality: Left;   ILEOSTOMY  01/13/2018   Procedure: ILEOSTOMY;  Surgeon: Clovis Riley, MD;  Location: [facility];  Service: General;;   ILEOSTOMY CLOSURE N/A 05/21/2018   Procedure: ILEOSTOMY REVERSAL ERAS PATHWAY;  Surgeon: Clovis Riley, MD;  Location: [place];  Service: General;  Laterality: N/A;   IR RADIOLOGIST EVAL & MGMT  11/19/2017   IR RADIOLOGIST EVAL & MGMT  12/03/2017   IR RADIOLOGIST EVAL & MGMT  12/18/2017   JOINT REPLACEMENT Right 2001   Total knee    LAMINECTOMY     X 3 LUMBAR AND X 2 CERVICAL SPINE OPERATIONS   LAPAROSCOPIC CHOLECYSTECTOMY W/ CHOLANGIOGRAPHY  11/09/04   SURGEON DR. [place] CATH AND CORONARY ANGIOGRAPHY N/A 09/16/2018   Procedure: LEFT HEART CATH AND CORONARY ANGIOGRAPHY;  Surgeon: Nigel Mormon, MD;  Location: [place] [facility];  Service: Cardiovascular;  Laterality: N/A;   LEFT HEART CATHETERIZATION WITH CORONARY ANGIOGRAM N/A 10/29/2014   Procedure: LEFT HEART CATHETERIZATION WITH CORONARY ANGIOGRAM;  Surgeon: Laverda Page, MD;  Location: [facility];  Service: Cardiovascular;  Laterality: N/A;   LIGATION OF COMPETING BRANCHES OF ARTERIOVENOUS FISTULA Left 07/21/2020   Procedure: LIGATION OF COMPETING BRANCHES OF  LEFT UPPER ARM ARTERIOVENOUS FISTULA;  Surgeon: Waynetta Sandy, MD;  Location: [place];  Service: Vascular;  Laterality: Left;   LOWER EXTREMITY ANGIOGRAM N/A 03/19/2012   Procedure: LOWER EXTREMITY ANGIOGRAM;  Surgeon: Burnell Blanks, MD;  Location: [facility];  Service: Cardiovascular;  Laterality: N/A;   LOWER EXTREMITY ANGIOGRAPHY N/A 06/20/2021   Procedure: LOWER EXTREMITY ANGIOGRAPHY;  Surgeon: Nigel Mormon, MD;  Location: [place] [facility];  Service: Cardiovascular;  Laterality: N/A;   NECK SURGERY     PARTIAL COLECTOMY N/A 01/13/2018   Procedure: LAPAROSCOPIC ASSISTED   SIGMOID COLECTOMY ILEOSTOMY;  Surgeon: Clovis Riley, MD;  [facility];  Service: General;  Laterality: N/A;   PENILE PROSTHESIS IMPLANT  08/14/05   INFRAPUBIC INSERTION OF INFLATABLE PENILE PROSTHESIS; SURGEON DR. Amalia Hailey   PENILE PROSTHESIS IMPLANT     PERCUTANEOUS CORONARY STENT INTERVENTION (PCI-S) Right 10/29/2014   Procedure: PERCUTANEOUS CORONARY STENT INTERVENTION (PCI-S);  Surgeon: Laverda Page, MD;  Location: [facility];  Service: Cardiovascular;  Laterality: Right;   PERIPHERAL VASCULAR INTERVENTION Left 06/23/2021   Procedure: PERIPHERAL VASCULAR INTERVENTION;   Surgeon: Nigel Mormon, MD;  Location: [place] [facility];  Service: Cardiovascular;  Laterality: Left;   REMOVAL OF PENILE PROSTHESIS N/A 06/14/2021   Procedure: Removal of THREE piece inflatable penile prosthesis;  Surgeon: Lucas Mallow, MD;  Location: [place];  Service: Urology;  Laterality: N/A;   SHOULDER ARTHROSCOPY     SPINE SURGERY     TOE AMPUTATION Left    TONSILLECTOMY     TOTAL KNEE ARTHROPLASTY  07/2002   RIGHT KNEE ; SURGEON  DR. [name] ALSO HAD ARTHROSCOPIC RIGHT KNEE IN  10/2001   TOTAL KNEE ARTHROPLASTY     ULNAR NERVE TRANSPOSITION Right 10/21/2013   Procedure: RIGHT ELBOW  ULNAR NERVE DECOMPRESSION;  Surgeon: Wynonia Sours, MD;  Location: [place];  Service: Orthopedics;  Laterality: Right;    Family Hx:  Family History  Problem Relation Age of Onset   Heart disease Father        Before age 1-  CAD, BPG   Diabetes Father        Amputation   Cancer Father        PROSTATE   Hyperlipidemia Father    Hypertension Father    Heart attack Father        Triple BPG   Varicose Veins Father    Cancer Sister        Breast   Hyperlipidemia Sister    Hypertension Sister    Heart attack Brother    Colon cancer Brother    Diabetes Brother    Heart disease Brother 56       A-Fib. Before age 59   Hyperlipidemia Brother    Hypertension Brother    Hypertension Son    Arthritis Other        GRANDMOTHER   Hypertension Other        OTHER FAMILY MEMBERS    Social History:  reports that he quit smoking about 10 years ago. His smoking use included cigarettes. He has a 70.00 pack-year smoking history. He has never used smokeless tobacco. He reports that he does not currently use alcohol. He reports that he does not use drugs.  Allergies:  Allergies  Allergen Reactions   Contrast Media [Iodinated Contrast Media] Shortness Of Breath and Other (See Comments)    Difficulty breathing and altered mental status  Ivp Dye [Iodinated Contrast Media]  Anaphylaxis, Shortness Of Breath and Other (See Comments)    Breathing problems, altered mental state    Adhesive [Tape] Rash and Other (See Comments)    Rash after 1 day of use Do not leave on longer then 3 days with out be changed   Latex Rash and Other (See Comments)    A severe rash appears after the first 24 hours of being placed    Medications: Prior to Admission medications   Medication Sig Start Date End Date Taking? Authorizing Provider  amiodarone (PACERONE) 200 MG tablet Take 0.5 tablets (100 mg total) by mouth daily. 04/27/22  Yes Adrian Prows, MD  amLODipine (NORVASC) 5 MG tablet Take 5 mg by mouth daily as needed for dizziness. Take one tablet for BP > 160 mm Hg 01/30/21  Yes [provider]  apixaban (ELIQUIS) 5 MG TABS tablet Take 1 tablet (5 mg total) by mouth 2 (two) times daily. 01/14/22 07/03/22 Yes Mercy Riding, MD  atorvastatin (LIPITOR) 40 MG tablet Take 40 mg by mouth at bedtime.    Yes [provider]  B Complex-C-Zn-Folic Acid (DIALYVITE/ZINC) TABS Take 1 tablet by mouth Every Tuesday,Thursday,and Saturday with dialysis. 10/04/21  Yes [provider]  ceFAZolin (ANCEF) IVPB Inject 2 g into the vein Every Tuesday,Thursday,and Saturday with dialysis for 25 days. Indication:  GBS bacteremia  First Dose: Yes Last Day of Therapy:  07/23/22 Labs - Once weekly:  CBC/D and BMP, Labs - Every other week:  ESR and CRP Method of administration: IV Push Method of administration may be changed at the discretion of home infusion pharmacist based upon assessment of the patient and/or caregiver's ability to self-administer the medication ordered. 06/28/22 07/23/22 Yes Rosiland Oz, MD  famotidine (PEPCID) 20 MG tablet Take 20 mg by mouth every other day.   Yes [provider]  fentaNYL (DURAGESIC) 50 MCG/HR Place 1 patch onto the skin every 3 (three) days. 09/05/21  Yes Sharen Hones, MD  folic acid (FOLVITE) 1 MG tablet Take 1 mg by mouth daily.  09/18/21  Yes [provider]  gentamicin (GARAMYCIN) IVPB Inject 100 mg into the vein Every Tuesday,Thursday,and Saturday with dialysis for 14 days. Indication:  GBS AV/MV IE - synergy Last Day of Therapy:  07/12/22 Labs - Sunday/Monday:  CBC/D, BMP, and gentamicin trough. Labs - Thursday:  BMP and gentamicin trough Labs - Every other week:  ESR and CRP Method of administration: Elastomeric Method of administration may be changed at the discretion of home infusion pharmacist based upon assessment of the patient and/or caregiver's ability to self-administer the medication ordered. 06/28/22 07/12/22 Yes Manandhar, Collene Mares, MD  Glucosamine HCl (GLUCOSAMINE PO) Take 1 tablet by mouth 2 (two) times daily.   Yes [provider]  insulin glargine (LANTUS) 100 UNIT/ML injection Inject 0.2 mLs (20 Units total) into the skin at bedtime. Patient taking differently: Inject 30 Units into the skin at bedtime. 06/28/22  Yes [name], MD  insulin lispro (HUMALOG) 100 UNIT/ML injection Inject 4-13 Units into the skin 3 (three) times daily before meals. Only with meals if needed   Yes [provider]  ketoconazole (NIZORAL) 2 % cream Apply 1 application topically 2 (two) times daily as needed for irritation.  03/07/18  Yes [provider]  lidocaine-prilocaine (EMLA) cream Apply 1 application topically See admin instructions. Prior to Dialysis days Tuesday,Thursday and saturday 06/06/21  Yes [provider]  loratadine (CLARITIN) 10 MG tablet Take  10 mg by mouth daily.   Yes [provider]  metoCLOPramide (REGLAN) 5 MG tablet Take 1 tablet (5 mg total) by mouth 4 (four) times daily -  before meals and at bedtime. Patient taking differently: Take 5 mg by mouth 2 (two) times daily. 01/14/22 07/13/22 Yes Mercy Riding, MD  midodrine (PROAMATINE) 10 MG tablet Take 1 tablet (10 mg total) by mouth every other day. One hour before dialysis Patient taking  differently: Take 10 mg by mouth Every Tuesday,Thursday,and Saturday with dialysis. One hour before dialysis 09/07/21  Yes Barb Merino, MD  nitroGLYCERIN (NITRODUR - DOSED IN MG/24 HR) 0.2 mg/hr patch Place 1 patch (0.2 mg total) onto the skin daily. 05/21/22  Yes Newt Minion, MD  nitroGLYCERIN (NITROSTAT) 0.4 MG SL tablet Place 0.4 mg under the tongue every 5 (five) minutes as needed for chest pain. 01/06/22  Yes [provider]  omega-3 acid ethyl esters (LOVAZA) 1 g capsule Take 2 g by mouth 2 (two) times daily. Mini   Yes [provider]  ondansetron (ZOFRAN-ODT) 4 MG disintegrating tablet Take 1 tablet by mouth 4 (four) times daily as needed for nausea/vomiting. 05/28/22  Yes [provider]  oxyCODONE-acetaminophen (PERCOCET) 7.5-325 MG tablet Take 1 tablet by mouth 3 (three) times daily as needed for moderate pain. Patient taking differently: Take 1 tablet by mouth every 8 (eight) hours. 09/05/21  Yes Sharen Hones, MD  pantoprazole (PROTONIX) 40 MG tablet Take 1 tablet (40 mg total) by mouth 2 (two) times daily for 30 days, THEN 1 tablet (40 mg total) daily. Patient taking differently:  1 tablet (40 mg total) daily. 01/14/22 07/03/22 Yes Mercy Riding, MD  Polyvinyl Alcohol-Povidone PF 1.4-0.6 % SOLN Place 1 drop into both eyes 2 (two) times daily as needed (dry eyes).    Yes [provider]  pregabalin (LYRICA) 100 MG capsule Take 100 mg by mouth 2 (two) times daily. 09/18/21  Yes [provider]  rOPINIRole (REQUIP) 2 MG tablet Take 2 mg by mouth at bedtime.   Yes [provider]  silver sulfADIAZINE (SILVADENE) 1 % cream Apply topically daily. Patient taking differently: Apply 1 Application topically daily as needed ([place]). 01/14/22  Yes Mercy Riding, MD  tamsulosin (FLOMAX) 0.4 MG CAPS capsule Take 0.4 mg by mouth daily.   Yes [provider]  NEEDLE, REUSABLE, 22 G 22G X 1-1/2" MISC 1 Units by Does not apply route as directed.  For B12 IM inj 02/15/18   Elgergawy, Silver Huguenin, MD  NOVOLOG FLEXPEN 100 UNIT/ML FlexPen Inject 4 Units into the skin 3 (three) times daily. Patient not taking: Reported on 07/03/2022 06/28/22   [name], MD  Syringe/Needle, Disp, (SYRINGE 3CC/22GX1-1/2") 22G X 1-1/2" 3 ML MISC 1 Syringe by Does not apply route as directed. For b12 IM inj 02/15/18   Elgergawy, Silver Huguenin, MD    I have reviewed the patient's current and reported prior to admission medications.  Labs:     Latest Ref Rng & Units 07/04/2022   11:46 AM 07/03/2022   12:55 PM 06/29/2022    3:05 PM  BMP  Glucose 70 - 99 mg/dL 274  463  260   BUN 8 - 23 mg/dL 49  48  25   Creatinine 0.61 - 1.24 mg/dL 8.63  8.52  4.69   Sodium 135 - 145 mmol/L 134  134  134   Potassium 3.5 - 5.1 mmol/L 4.6  4.2  3.4  Chloride 98 - 111 mmol/L 100  96  96   CO2 22 - 32 mmol/L _0 Calcium 8.9 - 10.3 mg/dL 6.9  7.3  8.1     Urinalysis    Component Value Date/Time   COLORURINE STRAW (A) 07/03/2022 1400   APPEARANCEUR CLEAR 07/03/2022 1400   LABSPEC 1.009 07/03/2022 1400   PHURINE 8.0 07/03/2022 1400   GLUCOSEU >=500 (A) 07/03/2022 1400   HGBUR SMALL (A) 07/03/2022 1400   BILIRUBINUR NEGATIVE 07/03/2022 1400   KETONESUR NEGATIVE 07/03/2022 1400   PROTEINUR 30 (A) 07/03/2022 1400   UROBILINOGEN 0.2 01/14/2015 0346   NITRITE NEGATIVE 07/03/2022 1400   LEUKOCYTESUR NEGATIVE 07/03/2022 1400     ROS:  Pertinent items noted in HPI and remainder of comprehensive ROS otherwise negative.  Physical Exam: Vitals:   07/04/22 1100 07/04/22 1127  BP: (!) 128/53 (!) 161/64  Pulse: (!) 59 70  Resp: 13 16  Temp: (!) 97.4 F (36.3 C) 98.6 F (37 C)  SpO2: 95% 98%     General: adult male in bed in NAD  HEENT: NCAT Eyes: EOMI sclera anicteric Neck: supple trachea midline  Heart: S1S2 no rub Lungs: clear and unlabored on room air  Abdomen: soft/nt/nd; obese habitus Extremities: right BKA - healed and no edema of residual  limb; left leg new BKA - wrapped  Skin: no rash on extremities exposed Neuro: alert and oriented x 3 provides hx and follows commands  Access: left AVF with bruit and thrill   Outpatient HD orders:  [place] [facility] [facility]  4 hours and 15 minutes BF 450; DF 800  EDW 112 kg  2K/2Ca AVF  Meds: mircera 100 mcg every 4 weeks (to be given on 10/28 - last outpatient ESA mircera 30 mcg on 06/16/22).  hectorol 4 mcg every tx;  Last outpatient HD on 10/12    Assessment/Plan:  # Osteomyelitis left foot - s/p left BKA on 72/09/47  # PAD  - complicates wound healing; note BKA as above - I have encouraged him to speak with his team about his right leg prosthesis not fitting well   # ESRD  - no emergent need for HD today - continue HD per TTS schedule - next treatment tomorrow - I have stopped continuous fluids (ESRD patient) - lasix 60 mg IV once now   # HTN  - Acceptable   # Anemia CKD  - Note ESA is due 07/07/22 - will plan to give aranesp 100 mcg on 10/28 per previous orders   # Metabolic bone disease  - continue hectorol (to be given with HD - please do not list on outpatient med list)   Disposition per primary team. Anticipate rehab or placement    Claudia Desanctis 07/04/2022, 1:51 PM

## 2022-07-04 NOTE — Progress Notes (Signed)
Pt seen in room alert/oriented in no apparent distress. Welcome guide /menu provided to pt with instructions. Pt verbalized understanding of instructions. Hospital valuables policy has been discussed with no complaints. Hospital bed in lowest positiion with 3 side rails up,call bell/room  phone within reach, and all wheels locked.

## 2022-07-04 NOTE — Op Note (Signed)
07/04/2022  8:48 AM  PATIENT:  [name]    PRE-OPERATIVE DIAGNOSIS:  Osteomyelitis Left Foot  POST-OPERATIVE DIAGNOSIS:  Same  PROCEDURE:  LEFT BELOW KNEE AMPUTATION Application of Kerecis micro graft 95 cm and Kerecis sheet 7 x 10 cm. Application of Prevena customizable and Prevena arthroform wound VAC dressings Application of Vive Wear stump shrinker and the [employer] limb protector  SURGEON:  Newt Minion, MD  ANESTHESIA:   General  PREOPERATIVE INDICATIONS:  [name] is a  66 y.o. male with a diagnosis of Osteomyelitis Left Foot who failed conservative measures and elected for surgical management.    The risks benefits and alternatives were discussed with the patient preoperatively including but not limited to the risks of infection, bleeding, nerve injury, cardiopulmonary complications, the need for revision surgery, among others, and the patient was willing to proceed.  OPERATIVE IMPLANTS: Kerecis micro graft 38 cm and Kerecis sheet 7 x 10 cm.   OPERATIVE FINDINGS: Good petechial bleeding wound margins were clear.  Vessels calcified.  OPERATIVE PROCEDURE: Patient was brought to the operating room after undergoing a regional anesthetic.  After adequate levels anesthesia were obtained a thigh tourniquet was placed and the lower extremity was prepped using DuraPrep draped into a sterile field. The foot was draped out of the sterile field with impervious stockinette.  A timeout was called and the tourniquet inflated.  A transverse skin incision was made 12 cm distal to the tibial tubercle, the incision curved proximally, and a large posterior flap was created.  The tibia was transected just proximal to the skin incision and beveled anteriorly.  The fibula was transected just proximal to the tibial incision.  The sciatic nerve was pulled cut and allowed to retract.  The vascular bundles were suture ligated with 2-0 silk.  The tourniquet was deflated and hemostasis obtained.     Drill holes were placed through the tibia and fibula to secure the [facility] tissue graft and the gastrocnemius fascia.    The Kerecis micro powder 95 cm was applied to the open wound that has a 200 cm surface area.  The 7 x 10 cm Kerecis sheet was then folded and secured to the distal tibia and fibula with #1 Vicryl.  A separate drill hole was then used to secure the [facility] tissue graft and the gastrocnemius fascia to the dorsum of the tibia.    The deep and superficial fascial layers were closed using #1 Vicryl.  The skin was closed using staples.    The Prevena customizable dressing was applied this was overwrapped with the arthroform sponge.  Charlie Pitter was used to secure the sponges and the circumferential compression was secured to the skin with Dermatac.  This was connected to the wound VAC pump and had a good suction fit this was covered with a stump shrinker and a limb protector.  Patient was taken to the PACU in stable condition.   DISCHARGE PLANNING:  Antibiotic duration: 24-hour antibiotics  Weightbearing: Nonweightbearing on the operative extremity  Pain medication: Opioid pathway  Dressing care/ Wound VAC: Continue wound VAC with the Prevena plus pump at discharge for 1 week  Ambulatory devices: Walker or kneeling scooter  Discharge to: Discharge planning based on recommendations per physical therapy  Follow-up: In the office 1 week after discharge.

## 2022-07-04 NOTE — Anesthesia Procedure Notes (Signed)
Anesthesia Regional Block: Adductor canal block   Pre-Anesthetic Checklist: , timeout performed,  Correct Patient, Correct Site, Correct Laterality,  Correct Procedure, Correct Position, site marked,  Risks and benefits discussed,  Surgical consent,  Pre-op evaluation,  At surgeon's request and post-op pain management  Laterality: Left  Prep: chloraprep       Needles:  Injection technique: Single-shot  Needle Type: Echogenic Stimulator Needle     Needle Length: 9cm  Needle Gauge: 21     Additional Needles:   Procedures:,,,, ultrasound used (permanent image in chart),,    Narrative:  Start time: 07/04/2022 7:22 AM End time: 07/04/2022 7:25 AM Injection made incrementally with aspirations every 5 mL.  Performed by: Personally  Anesthesiologist: Effie Berkshire, MD  Additional Notes: Patient tolerated the procedure well. Local anesthetic introduced in an incremental fashion under minimal resistance after negative aspirations. No paresthesias were elicited. After completion of the procedure, no acute issues were identified and patient continued to be monitored by RN.

## 2022-07-04 NOTE — Plan of Care (Signed)
  Problem: Education: Goal: Knowledge of General Education information will improve Description: Including pain rating scale, medication(s)/side effects and non-pharmacologic comfort measures Outcome: Progressing   Problem: Activity: Goal: Risk for activity intolerance will decrease Outcome: Progressing   Problem: Nutrition: Goal: Adequate nutrition will be maintained Outcome: Progressing   Problem: Coping: Goal: Level of anxiety will decrease Outcome: Progressing   Problem: Elimination: Goal: Will not experience complications related to bowel motility Outcome: Progressing   Problem: Skin Integrity: Goal: Risk for impaired skin integrity will decrease Outcome: Progressing

## 2022-07-04 NOTE — Consult Note (Signed)
   [facility]patient Consult   07/04/2022  [name] April 23, 1956 [id number]  [facility] [ACO] Patient: [name]  Primary Care Provider:  [name], Hershal Coria, at [facility]   Patient screened for hospitalization with noted high risk score for unplanned readmission risk and to assess for potential [facility] service needs for post hospital transition.  Review of patient's medical record reveals patient is post op today Left BKA.  1620: Rounds post op today.  Patient was being followed by Palliative Care noted  Plan:  Continue to follow progress and disposition to assess for post hospital care management needs.    For questions contact:   Natividad Brood, RN BSN [facility] Liaison  [phone] business mobile phone Toll free office [phone]  Fax number: [phone] [email or web address]

## 2022-07-04 NOTE — Anesthesia Procedure Notes (Signed)
Anesthesia Regional Block: Popliteal block   Pre-Anesthetic Checklist: , timeout performed,  Correct Patient, Correct Site, Correct Laterality,  Correct Procedure, Correct Position, site marked,  Risks and benefits discussed,  Surgical consent,  Pre-op evaluation,  At surgeon's request and post-op pain management  Laterality: Left  Prep: chloraprep       Needles:  Injection technique: Single-shot  Needle Type: Echogenic Stimulator Needle     Needle Length: 9cm  Needle Gauge: 21     Additional Needles:   Procedures:,,,, ultrasound used (permanent image in chart),,    Narrative:  Start time: 07/04/2022 7:18 AM End time: 07/04/2022 7:21 AM Injection made incrementally with aspirations every 5 mL.  Performed by: Personally  Anesthesiologist: Effie Berkshire, MD  Additional Notes: Patient tolerated the procedure well. Local anesthetic introduced in an incremental fashion under minimal resistance after negative aspirations. No paresthesias were elicited. After completion of the procedure, no acute issues were identified and patient continued to be monitored by RN.

## 2022-07-04 NOTE — Anesthesia Procedure Notes (Signed)
Procedure Name: LMA Insertion Date/Time: 07/04/2022 7:53 AM  Performed by: Darletta Moll, CRNAPre-anesthesia Checklist: Patient identified, Emergency Drugs available, Suction available and Patient being monitored Patient Re-evaluated:Patient Re-evaluated prior to induction Oxygen Delivery Method: Circle system utilized Preoxygenation: Pre-oxygenation with 100% oxygen Induction Type: IV induction Ventilation: Mask ventilation without difficulty LMA: LMA inserted LMA Size: 5.0 Number of attempts: 1 Placement Confirmation: positive ETCO2, breath sounds checked- equal and bilateral and CO2 detector Tube secured with: Tape Dental Injury: Teeth and Oropharynx as per pre-operative assessment

## 2022-07-05 ENCOUNTER — Encounter (HOSPITAL_COMMUNITY): Payer: Self-pay | Admitting: Orthopedic Surgery

## 2022-07-05 LAB — BASIC METABOLIC PANEL
Anion gap: 15 (ref 5–15)
BUN: 56 mg/dL — ABNORMAL HIGH (ref 8–23)
CO2: 21 mmol/L — ABNORMAL LOW (ref 22–32)
Calcium: 6.8 mg/dL — ABNORMAL LOW (ref 8.9–10.3)
Chloride: 95 mmol/L — ABNORMAL LOW (ref 98–111)
Creatinine, Ser: 8.99 mg/dL — ABNORMAL HIGH (ref 0.61–1.24)
GFR, Estimated: 6 mL/min — ABNORMAL LOW (ref >60)
Glucose, Bld: 344 mg/dL — ABNORMAL HIGH (ref 70–99)
Potassium: 4.9 mmol/L (ref 3.5–5.1)
Sodium: 131 mmol/L — ABNORMAL LOW (ref 135–145)

## 2022-07-05 LAB — CBC
HCT: 24.6 % — ABNORMAL LOW (ref 39.0–52.0)
Hemoglobin: 8.4 g/dL — ABNORMAL LOW (ref 13.0–17.0)
MCH: 31.2 pg (ref 26.0–34.0)
MCHC: 34.1 g/dL (ref 30.0–36.0)
MCV: 91.4 fL (ref 80.0–100.0)
Platelets: 229 10*3/uL (ref 150–400)
RBC: 2.69 MIL/uL — ABNORMAL LOW (ref 4.22–5.81)
RDW: 13.8 % (ref 11.5–15.5)
WBC: 8.4 10*3/uL (ref 4.0–10.5)
nRBC: 0 % (ref 0.0–0.2)

## 2022-07-05 LAB — GLUCOSE, CAPILLARY
Glucose-Capillary: 474 mg/dL — ABNORMAL HIGH (ref 70–99)
Glucose-Capillary: 326 mg/dL — ABNORMAL HIGH (ref 70–99)
Glucose-Capillary: 247 mg/dL — ABNORMAL HIGH (ref 70–99)
Glucose-Capillary: 301 mg/dL — ABNORMAL HIGH (ref 70–99)
Glucose-Capillary: 386 mg/dL — ABNORMAL HIGH (ref 70–99)

## 2022-07-05 LAB — SURGICAL PATHOLOGY

## 2022-07-05 MED ORDER — INSULIN GLARGINE-YFGN 100 UNIT/ML ~~LOC~~ SOLN
30.0000 [IU] | Freq: Every day | SUBCUTANEOUS | Status: DC
  Administered 2022-07-05 – 2022-07-08 (×4): 30 [IU] via SUBCUTANEOUS
  Filled 2022-07-05 (×5): qty 0.3

## 2022-07-05 MED ORDER — INSULIN ASPART 100 UNIT/ML IJ SOLN
5.0000 [IU] | Freq: Once | INTRAMUSCULAR | Status: AC
  Administered 2022-07-05: 5 [IU] via SUBCUTANEOUS

## 2022-07-05 MED ORDER — DARBEPOETIN ALFA 100 MCG/0.5ML IJ SOSY
100.0000 ug | PREFILLED_SYRINGE | INTRAMUSCULAR | Status: DC
  Administered 2022-07-08 – 2022-07-14 (×2): 100 ug via SUBCUTANEOUS
  Filled 2022-07-05 (×3): qty 0.5

## 2022-07-05 NOTE — Anesthesia Postprocedure Evaluation (Signed)
Anesthesia Post Note  Patient: [name]  Procedure(s) Performed: LEFT BELOW KNEE AMPUTATION (Left: Knee)     Patient location during evaluation: PACU Anesthesia Type: General Level of consciousness: awake and alert Pain management: pain level controlled Vital Signs Assessment: post-procedure vital signs reviewed and stable Respiratory status: spontaneous breathing, nonlabored ventilation, respiratory function stable and patient connected to nasal cannula oxygen Cardiovascular status: blood pressure returned to baseline and stable Postop Assessment: no apparent nausea or vomiting Anesthetic complications: no   No notable events documented.              Effie Berkshire

## 2022-07-05 NOTE — Progress Notes (Signed)
Turned off UF due to pt. Complaint of dizzyness and decrease SBP 98/64 per Dr. Royce Macadamia.

## 2022-07-05 NOTE — Progress Notes (Signed)
OT Cancellation Note  Patient Details Name: [name] MRN: [id number] DOB: 14-Sep-1955   Cancelled Treatment:    Reason Eval/Treat Not Completed: Patient at procedure or test/ unavailable (Pt in HD. Will check back another  time.)  [facility] 07/05/2022, 8:59 AM Maurie Boettcher, OT/L   Acute OT Clinical Specialist Acute Rehabilitation Services Pager [phone] Office [phone]

## 2022-07-05 NOTE — Progress Notes (Signed)
PT Cancellation Note  Patient Details Name: [name] MRN: [id number] DOB: [birth year]   Cancelled Treatment:    Reason Eval/Treat Not Completed: Patient at procedure or test/unavailable (HD). Will check back at another time.   Leighton Roach, PT  Acute Rehab Services Secure chat preferred Office [place] 07/05/2022, 9:48 AM

## 2022-07-05 NOTE — Progress Notes (Signed)
Received patient in bed to unit.  Alert and oriented.  Informed consent signed and in chart.   Treatment initiated: 0817 Treatment completed: 1237p  Patient tolerated well.  Transported back to the room  Alert, without acute distress.  Hand-off given to patient's nurse.   Access used: Yes Access issues: No  Total UF removed: 2600 Medication(s) given: Midodrine '10mg'$ , Oxycodone '15mg'$  Post HD VS: 98.4, 110/48, 64 17 R/A 96 Post HD weight: 123.4kg   [facility]

## 2022-07-05 NOTE — Progress Notes (Signed)
[facility] Progress Note  Name: [name] MRN: [id number] DOB: 03-08-1956  Chief Complaint:  Chest pain before planned BKA  Subjective:  Seen and examined on dialysis.  Blood pressure 98/44.  left AVF in use.  Procedure supervised.  We are pausing UF as he is starting to feel symptomatic - states he feels poorly if he drops under 338 systolic.  Spoke with HD RN.  Review of systems:  Denies n/v  Denies chest pain or shortness of breath  Some Post-op discomfort  ------------- Background on consult:  [name] is a 66 y.o. male with a history including ESRD, DM, HTN, PAD, and sleep apnea who presented to the hospital for chest pain and tightness.  "Not like an elephant".  He was recently discharged after treatment for an abscess and osteomyelitis of the left foot.  He had a planned amputation scheduled for the day after presentation to the ER.  Ortho was consulted and he underwent left BKA this am.  Nephrology is consulted for assistance with management of ESRD.  He had HD off schedule on Friday to allow him to attend his daughter's wedding this past Saturday.  He missed HD on Tuesday because he felt poorly and later presented to the ER.  He still makes quite a bit of urine.  He had fluids ordered per primary team.  Right leg prosthesis doesn't fit great - he states takes multiple (4-6 pair) of socks to wear it.  He has lost leg circumference since it was fitted.     Intake/Output Summary (Last 24 hours) at 07/05/2022 0959 Last data filed at 07/04/2022 1135 Gross per 24 hour  Intake --  Output 0 ml  Net 0 ml    Vitals:  Vitals:   07/05/22 0900 07/05/22 0913 07/05/22 0914 07/05/22 0930  BP: 122/69 122/69 122/69 107/64  Pulse: 69 73 72 61  Resp: '12 18 12 14  '$ Temp:      TempSrc:      SpO2: 97% 99% 100% 95%  Weight:         Physical Exam:    General: adult male in bed in NAD  HEENT: NCAT Eyes: EOMI sclera anicteric Neck: supple trachea midline  Heart:  S1S2 no rub Lungs: clear and unlabored on room air  Abdomen: soft/nt/nd; obese habitus Extremities: right BKA - healed and no edema of residual limb; left leg new BKA with protective device  Skin: no rash on extremities exposed Neuro: alert and oriented x 3 provides hx and follows commands  Access: left AVF with bruit and thrill   Medications reviewed   Labs:     Latest Ref Rng & Units 07/05/2022    3:52 AM 07/04/2022    6:52 PM 07/04/2022   11:46 AM  BMP  Glucose 70 - 99 mg/dL 344  530  274   BUN 8 - 23 mg/dL 56   49   Creatinine 0.61 - 1.24 mg/dL 8.99   8.63   Sodium 135 - 145 mmol/L 131   134   Potassium 3.5 - 5.1 mmol/L 4.9   4.6   Chloride 98 - 111 mmol/L 95   100   CO2 22 - 32 mmol/L 21   21   Calcium 8.9 - 10.3 mg/dL 6.8   6.9    Outpatient HD orders:  [place] [facility]  4 hours and 15 minutes BF 450; DF 800  EDW 112 kg  2K/2Ca AVF  Meds: mircera 100 mcg every 4  weeks (to be given on 10/28 - last outpatient ESA mircera 30 mcg on 06/16/22).  hectorol 4 mcg every tx;  Last outpatient HD on 10/12     Assessment/Plan:   # Osteomyelitis left foot - s/p left BKA on 29/92/42   # PAD  - complicates wound healing; note BKA as above - I have encouraged him to speak with his team about his right leg prosthesis not fitting well    # ESRD  - HD per TTS schedule  - strict ins/outs ordered - he is on midodrine pre-HD per his established regimen   # HTN  - Acceptable    # Anemia CKD  - Note ESA is due 07/07/22 per outpatient orders - will plan to give aranesp 100 mcg on 10/28 as planned   # Metabolic bone disease  - continue hectorol (to be given with HD - please do not list on outpatient med list)    Disposition per primary team. Anticipate rehab or placement   Claudia Desanctis, MD 07/05/2022 10:26 AM

## 2022-07-05 NOTE — Progress Notes (Signed)
Patient ID: [name], male   DOB: 1956-07-28, 66 y.o.   MRN: [id number] Patient is postoperative day 1 left transtibial amputation.  Patient's blood glucose was elevated last night will increase his long-acting insulin to 30 units.  There is no drainage in the wound VAC canister there is a good suction fit.  Patient states he would like to participate with inpatient rehab.

## 2022-07-06 ENCOUNTER — Ambulatory Visit: Payer: Medicare HMO | Admitting: Orthopaedic Surgery

## 2022-07-06 LAB — GLUCOSE, CAPILLARY
Glucose-Capillary: 207 mg/dL — ABNORMAL HIGH (ref 70–99)
Glucose-Capillary: 213 mg/dL — ABNORMAL HIGH (ref 70–99)
Glucose-Capillary: 229 mg/dL — ABNORMAL HIGH (ref 70–99)
Glucose-Capillary: 338 mg/dL — ABNORMAL HIGH (ref 70–99)

## 2022-07-06 LAB — BASIC METABOLIC PANEL
Anion gap: 14 (ref 5–15)
BUN: 31 mg/dL — ABNORMAL HIGH (ref 8–23)
CO2: 27 mmol/L (ref 22–32)
Calcium: 7.2 mg/dL — ABNORMAL LOW (ref 8.9–10.3)
Chloride: 94 mmol/L — ABNORMAL LOW (ref 98–111)
Creatinine, Ser: 6.25 mg/dL — ABNORMAL HIGH (ref 0.61–1.24)
GFR, Estimated: 9 mL/min — ABNORMAL LOW (ref >60)
Glucose, Bld: 238 mg/dL — ABNORMAL HIGH (ref 70–99)
Potassium: 3.8 mmol/L (ref 3.5–5.1)
Sodium: 135 mmol/L (ref 135–145)

## 2022-07-06 LAB — CBC
HCT: 25.7 % — ABNORMAL LOW (ref 39.0–52.0)
Hemoglobin: 8.8 g/dL — ABNORMAL LOW (ref 13.0–17.0)
MCH: 31.2 pg (ref 26.0–34.0)
MCHC: 34.2 g/dL (ref 30.0–36.0)
MCV: 91.1 fL (ref 80.0–100.0)
Platelets: 205 10*3/uL (ref 150–400)
RBC: 2.82 MIL/uL — ABNORMAL LOW (ref 4.22–5.81)
RDW: 13.7 % (ref 11.5–15.5)
WBC: 8.3 10*3/uL (ref 4.0–10.5)
nRBC: 0 % (ref 0.0–0.2)

## 2022-07-06 LAB — HEPATITIS B SURFACE ANTIBODY, QUANTITATIVE: Hep B S AB Quant (Post): 39.6 m[IU]/mL (ref >9.9)

## 2022-07-06 MED ORDER — INSULIN ASPART 100 UNIT/ML IJ SOLN
5.0000 [IU] | Freq: Once | INTRAMUSCULAR | Status: AC
  Administered 2022-07-06: 5 [IU] via SUBCUTANEOUS

## 2022-07-06 MED ORDER — CHLORHEXIDINE GLUCONATE CLOTH 2 % EX PADS
6.0000 | MEDICATED_PAD | Freq: Every day | CUTANEOUS | Status: DC
  Administered 2022-07-07: 6 via TOPICAL

## 2022-07-06 MED ORDER — DOXERCALCIFEROL 2.5 MCG PO CAPS
4.0000 ug | ORAL_CAPSULE | ORAL | Status: DC
  Administered 2022-07-12: 4 ug via ORAL
  Filled 2022-07-06 (×4): qty 3

## 2022-07-06 NOTE — Progress Notes (Signed)
Inpatient Rehab Admissions Coordinator:    I met with pt. To discuss potential cir admit and called wife to discuss. Pt.'s wife works full time and is not sure she can care for pt. At d/c. Wants to take the evening to discuss together. I will follow up for a decision in the morning.  Laura Staley, MS, CCC-SLP Rehab Admissions Coordinator  [phone] (celll) [phone] (office)   

## 2022-07-06 NOTE — Plan of Care (Signed)
  Problem: Coping: Goal: Ability to adjust to condition or change in health will improve Outcome: Progressing   Problem: Metabolic: Goal: Ability to maintain appropriate glucose levels will improve Outcome: Progressing   Problem: Skin Integrity: Goal: Risk for impaired skin integrity will decrease Outcome: Progressing   Problem: Clinical Measurements: Goal: Postoperative complications will be avoided or minimized Outcome: Progressing   Problem: Pain Management: Goal: Pain level will decrease with appropriate interventions Outcome: Progressing

## 2022-07-06 NOTE — Evaluation (Signed)
Occupational Therapy Evaluation Patient Details Name: [name] MRN: [id number] DOB: 1955/11/28 Today's Date: 07/06/2022   History of Present Illness 66 yo male Admitted 10/24 for  dx sepsis with lactic acidosis. 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 S/p Lt BKA 10/25.   Clinical Impression   Pt received bed, in pain 6/10, premedicated and agreeable to OT with encouragement and education on mobility to improve strength, endurance and functional performance in prep for ADLs. Overall pt verbose needing occasional direct VC for improved command following especially related mobility techniques. Pt completes sup>sit with MAX A, intially needing min-mod A for sitting balance then improving to supervision for lateral leans for weight shifting to place chuck under pt to decrease skin shearing on SB. Lateral scoot transfers EOB<>w/c with seated rest in between after increased time to don R prosthesis and MOD A. Exited session with pt seated in bed, exit alarm on and call light in reach      Recommendations for follow up therapy are one component of a multi-disciplinary discharge planning process, led by the attending physician.  Recommendations may be updated based on patient status, additional functional criteria and insurance authorization.   Follow Up Recommendations  Acute inpatient rehab (3hours/day) (if family able to provide 24/7 and min-mod A)    Assistance Recommended at Discharge Frequent or constant Supervision/Assistance  Patient can return home with the following A little help with walking and/or transfers;A little help with bathing/dressing/bathroom;Assistance with cooking/housework;Direct supervision/assist for financial management;Direct supervision/assist for medications  management;Assist for transportation;Help with stairs or ramp for entrance    Functional Status Assessment  Patient has had a recent decline in their functional status and demonstrates the ability to make significant improvements in function in a reasonable and predictable amount of time.  Equipment Recommendations   (TBD)    Recommendations for Other Services       Precautions / Restrictions Precautions Precautions: Fall;Knee Precaution Comments: Lt BKA precautions reviewed, Required Braces or Orthoses:  (Limb protector Lt) Restrictions Weight Bearing Restrictions: No Other Position/Activity Restrictions: has RLE prosthetic      Mobility Bed Mobility Overal bed mobility: Needs Assistance Bed Mobility: Supine to Sit, Sit to Supine   Sidelying to sit: Independent Supine to sit: Max assist, +2 for physical assistance, HOB elevated Sit to supine: HOB elevated, Mod assist   General bed mobility comments: Pt with FOF impacting anterior weight shift, but once upright for ~5 min able to lean laterally to place chuck under skin and slide board  for transfers    Transfers Overall transfer level: Needs assistance                Lateral/Scoot Transfers: Max assist General transfer comment: lateral scoot via SB in both directions with MAX A. Pt need short direct VC for hand placement during transfers.  Pt following directions well for head hips relationship and increased time.      Balance Overall balance assessment: Needs assistance Sitting-balance support: Feet supported, No upper extremity supported, Feet unsupported Sitting balance-Leahy Scale: Fair Sitting balance - Comments: Sat EOB approx 15 min working on seated balance, transfers, scooting. initially required assist for balance but later progressed to unsupported sitting and reaching.       ADL either performed or assessed with clinical judgement   ADL  Eating/Feeding: Set up   Grooming: Sitting   Upper Body  Bathing: Minimal assistance   Lower Body Bathing: Maximal assistance;Bed level   Upper Body Dressing : Minimal assistance   Lower Body Dressing: Maximal assistance;Bed level   Toilet Transfer: Maximal assistance;Requires drop arm   Toileting- Clothing Manipulation and Hygiene: Maximal assistance;Bed level               Vision Baseline Vision/History: 0 No visual deficits Ability to See in Adequate Light: 0 Adequate Patient Visual Report: No change from baseline Vision Assessment?: No apparent visual deficits     Perception Perception Perception: Within Functional Limits   Praxis Praxis Praxis: Intact    Pertinent Vitals/Pain Pain Assessment Faces Pain Scale: Hurts little more Facial Expression: smiling or inexpressive     Hand Dominance Right   Extremity/Trunk Assessment Upper Extremity Assessment Upper Extremity Assessment: Generalized weakness (Pt reports "has had the shakes for over a year")   Lower Extremity Assessment Lower Extremity Assessment: Generalized weakness RLE Deficits / Details: Gross weakness, hx of Rt BKA LLE: Unable to fully assess due to pain;Unable to fully assess due to immobilization   Cervical / Trunk Assessment Cervical / Trunk Assessment: Normal   Communication Communication Communication: No difficulties   Cognition Arousal/Alertness: Awake/alert Behavior During Therapy: WFL for tasks assessed/performed Overall Cognitive Status: Within Functional Limits for tasks assessed      General Comments  Education for positioning on Lt residual limb            Home Living Family/patient expects to be discharged to:: Inpatient rehab Living Arrangements: Spouse/significant other;Children Available Help at Discharge: Family;Available 24 hours/day Type of Home: House Home Access: Ramped entrance     Home Layout: One level     Bathroom Shower/Tub: Walk-in shower;Tub/shower unit   Bathroom Toilet: Handicapped height Bathroom  Accessibility: Yes   Home Equipment: Conservation officer, nature (2 wheels);Crutches;Cane - single point;Tub bench;Grab bars - toilet;Grab bars - tub/shower;Hand held shower head;Wheelchair - power;Wheelchair - manual;BSC/3in1;Rollator (4 wheels);Other (comment)   Additional Comments: reports all equipment needed is at his home      Prior Functioning/Environment Prior Level of Function : Driving;Needs assist       Physical Assist : Mobility (physical) Mobility (physical): Transfers   Mobility Comments: transfers to wheelchair but cannot walk ADLs Comments: reports ind        OT Problem List: Decreased strength;Decreased range of motion;Decreased activity tolerance;Impaired balance (sitting and/or standing);Decreased safety awareness;Decreased knowledge of use of DME or AE      OT Treatment/Interventions: Self-care/ADL training;Therapeutic exercise;Energy conservation;DME and/or AE instruction;Therapeutic activities;Patient/family education;Balance training    OT Goals(Current goals can be found in the care plan section) Acute Rehab OT Goals Patient Stated Goal: get stronger OT Goal Formulation: With patient Time For Goal Achievement: 07/19/22 Potential to Achieve Goals: Good  OT Frequency: Min 2X/week    Co-evaluation       OT goals addressed during session: ADL's and self-care;Proper use of Adaptive equipment and DME;Strengthening/ROM      AM-PAC OT "6 Clicks" Daily Activity     Outcome Measure Help from another person eating meals?: None Help from another person taking care of personal grooming?: A Little Help from another person toileting, which includes using toliet, bedpan, or urinal?: Total Help from another person bathing (including washing, rinsing, drying)?: A Lot Help from another person to put on and taking off regular upper body clothing?: A Little Help from another person to put on and taking off regular lower  body clothing?: Total 6 Click Score: 14   End of Session  Equipment Utilized During Treatment: Gait belt;Other (comment) (SB) Nurse Communication: Mobility status;Need for lift equipment  Activity Tolerance: Patient tolerated treatment well Patient left: with call bell/phone within reach;in bed  OT Visit Diagnosis: Unsteadiness on feet (R26.81);Other abnormalities of gait and mobility (R26.89);Muscle weakness (generalized) (M62.81)                Time: 1561-5379 OT Time Calculation (min): 40 min Charges:  OT General Charges $OT Visit: 1 Visit OT Evaluation $OT Eval Low Complexity: 1 Low OT Treatments $Therapeutic Activity: 23-37 mins  [facility] MOTR/L Tonny Branch 07/06/2022, 12:40 PM

## 2022-07-06 NOTE — Progress Notes (Signed)
Patient's blood sugar is 338 mg/dl at 1949, MD notified, got a verbal order for 5 units of novo log from PA Lanier Clam,

## 2022-07-06 NOTE — Progress Notes (Signed)
[facility] Progress Note  Name: [name] MRN: [id number] DOB: 29-Jul-1956  Chief Complaint:  Chest pain before planned BKA  Subjective:  Last HD on 10/26 with 2.6 kg UF.  Feels ok.  Hoping to go to rehab.  Worked with therapy this am and motivated to get moving.    Review of systems:  Denies n/v  sometimes shortness of breath  ------------- Background on consult:  [name] is a 66 y.o. male with a history including ESRD, DM, HTN, PAD, and sleep apnea who presented to the hospital for chest pain and tightness.  "Not like an elephant".  He was recently discharged after treatment for an abscess and osteomyelitis of the left foot.  He had a planned amputation scheduled for the day after presentation to the ER.  Ortho was consulted and he underwent left BKA this am.  Nephrology is consulted for assistance with management of ESRD.  He had HD off schedule on Friday to allow him to attend his daughter's wedding this past Saturday.  He missed HD on Tuesday because he felt poorly and later presented to the ER.  He still makes quite a bit of urine.  He had fluids ordered per primary team.  Right leg prosthesis doesn't fit great - he states takes multiple (4-6 pair) of socks to wear it.  He has lost leg circumference since it was fitted.     Intake/Output Summary (Last 24 hours) at 07/06/2022 1247 Last data filed at 07/05/2022 2051 Gross per 24 hour  Intake 100.05 ml  Output 850 ml  Net -749.95 ml    Vitals:  Vitals:   07/05/22 1245 07/05/22 1406 07/05/22 2051 07/06/22 0733  BP:  (!) 147/60 (!) 133/57 (!) 117/50  Pulse:  66 72 62  Resp:  '16 16 16  '$ Temp:   98.8 F (37.1 C) 99.3 F (37.4 C)  TempSrc:   Oral Oral  SpO2:  99% 98% 97%  Weight: 123.4 kg        Physical Exam:     General: adult male in bed in NAD  HEENT: NCAT Eyes: EOMI sclera anicteric Neck: supple trachea midline  Heart: S1S2 no rub Lungs: clear and unlabored on room air  Abdomen: soft/nt/nd;  obese habitus Extremities: right BKA - healed and no edema of residual limb; left leg new BKA with protective device  Skin: no rash on extremities exposed Neuro: alert and oriented x 3 provides hx and follows commands  Access: left AVF with bruit and thrill   Medications reviewed   Labs:     Latest Ref Rng & Units 07/06/2022    5:01 AM 07/05/2022    3:52 AM 07/04/2022    6:52 PM  BMP  Glucose 70 - 99 mg/dL 238  344  530   BUN 8 - 23 mg/dL 31  56    Creatinine 0.61 - 1.24 mg/dL 6.25  8.99    Sodium 135 - 145 mmol/L 135  131    Potassium 3.5 - 5.1 mmol/L 3.8  4.9    Chloride 98 - 111 mmol/L 94  95    CO2 22 - 32 mmol/L 27  21    Calcium 8.9 - 10.3 mg/dL 7.2  6.8     Outpatient HD orders:  [place] [facility]  4 hours and 15 minutes BF 450; DF 800  EDW 112 kg  2K/2Ca AVF  Meds: mircera 100 mcg every 4 weeks (to be given on 10/28 - last outpatient  ESA mircera 30 mcg on 06/16/22).  hectorol 4 mcg every tx;  Last outpatient HD on 10/12     Assessment/Plan:   # Osteomyelitis left foot - s/p left BKA on 92/01/00   # PAD  - complicates wound healing; note BKA as above - I have encouraged him to speak with his team about his right leg prosthesis not fitting well    # ESRD  - HD per TTS schedule  - strict ins/outs ordered - he is on midodrine pre-HD per his established regimen   # HTN  - Acceptable    # Anemia CKD  - Note ESA is due 07/07/22 per outpatient orders - will plan to give aranesp 100 mcg on 10/28 as planned   # Metabolic bone disease  - continue hectorol (to be given with HD - please do not list on outpatient med list)    Disposition per primary team. Anticipate rehab - very motivated  Claudia Desanctis, MD 07/06/2022 12:59 PM

## 2022-07-06 NOTE — Progress Notes (Signed)
Patient ID: [name], male   DOB: September 18, 1955, 66 y.o.   MRN: [id number] Patient is status post left transtibial amputation.  There is no drainage in the wound VAC canister.  There is a good suction fit.  Patient is motivated and would like to discharge to inpatient rehab.

## 2022-07-06 NOTE — Care Management Important Message (Signed)
Important Message  Patient Details  Name: [name] MRN: [id number] Date of Birth: [birth year]   Medicare Important Message Given:  Yes     Hannah Beat 07/06/2022, 2:20 PM

## 2022-07-06 NOTE — Evaluation (Signed)
Physical Therapy Evaluation Patient Details Name: [name] MRN: [id number] DOB: December 15, 1955 Today's Date: 07/06/2022  History of Present Illness  66 yo male Admitted 10/24 for  dx sepsis with lactic acidosis. 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 S/p Lt BKA 10/25.  Clinical Impression  Patient is s/p above surgery resulting in functional limitations due to the deficits listed below (see PT Problem List). Tolerated sitting EOB x 15 min. Multiple techniques used to attempt transfer out of bed including lateral scoot, A/P scoot, and sit<>stand pivot. Each technique unsuccessful this date due to patient weakness and habitus. Recommend use of hoyer lift for nursing staff at this time until patient develops improved ability/safety with transfers. May benefit from AIR to assist patient back to prior level of function. States he can stand pivot to and from his power wheelchair at home but is non-ambulatory at baseline. Has Rt LE prosthesis to assist with transfers. Patient will benefit from skilled PT to increase their independence and safety with mobility to allow discharge to the venue listed below.          Recommendations for follow up therapy are one component of a multi-disciplinary discharge planning process, led by the attending physician.  Recommendations may be updated based on patient status, additional functional criteria and insurance authorization.  Follow Up Recommendations Acute inpatient rehab (3hours/day) Can patient physically be transported by private vehicle: No    Assistance Recommended at Discharge Frequent or constant Supervision/Assistance  Patient can return home with the following  Help with stairs or ramp for entrance;Assist for transportation;Two people to help with walking  and/or transfers;Two people to help with bathing/dressing/bathroom;Assistance with cooking/housework    Equipment Recommendations None recommended by PT  Recommendations for Other Services  Rehab consult    Functional Status Assessment Patient has had a recent decline in their functional status and demonstrates the ability to make significant improvements in function in a reasonable and predictable amount of time.     Precautions / Restrictions Precautions Precautions: Fall;Knee Precaution Comments: Lt BKA precautions reviewed, Required Braces or Orthoses:  (Limb protector Lt) Restrictions Weight Bearing Restrictions: No Other Position/Activity Restrictions: has RLE prosthetic      Mobility  Bed Mobility Overal bed mobility: Needs Assistance Bed Mobility: Supine to Sit, Sit to Supine     Supine to sit: Max assist, +2 for physical assistance, HOB elevated Sit to supine: HOB elevated, Mod assist   General bed mobility comments: Max +2 to bring pt to EOB with head elevated. Shows overt weakness in trunk and UE muscles attempting to move, needing assist for trunk and LEs to bring to EOB, pt anxious of falling from bed despite reassurance of adequate guarding from +2 assist. Mod assist for bringing pt back to supine position, cues to use rail and was able to improve mobility with use of rail.    Transfers Overall transfer level: Needs assistance Equipment used: Rolling walker (2 wheels) Transfers: Sit to/from Stand Sit to Stand: Total assist, +2 physical assistance, From elevated surface           General transfer comment: Attempted unsuccessfully to perform sit<>stand transfer with RLE prosthesis donned. +2 assist bed elevated using RW but unable to clear buttock from bed after several attempts. Also attempted lateral scoot and A/P transfer however pt weakness  and habitus preventing safe transfer at this time with +2 assistance.    Ambulation/Gait                General Gait Details: Non-ambulatory at baseline.  Stairs            Wheelchair Mobility    Modified Rankin (Stroke Patients Only)       Balance Overall balance assessment: Needs assistance Sitting-balance support: Feet supported, No upper extremity supported, Feet unsupported Sitting balance-Leahy Scale: Fair Sitting balance - Comments: Sat EOB approx 15 min working on seated balance, transfers, scooting. initially required assist for balance but later progressed to unsupported sitting and reaching.                                     Pertinent Vitals/Pain Pain Assessment Pain Assessment: Faces Faces Pain Scale: Hurts even more Pain Location: Lt residual limb Pain Descriptors / Indicators: Grimacing, Guarding Pain Intervention(s): Limited activity within patient's tolerance, Monitored during session, Repositioned, Patient requesting pain meds-RN notified    Home Living Family/patient expects to be discharged to:: Inpatient rehab Living Arrangements: Spouse/significant other;Children Available Help at Discharge: Family;Available 24 hours/day Type of Home: House Home Access: Ramped entrance       Home Layout: One level Home Equipment: Conservation officer, nature (2 wheels);Crutches;Cane - single point;Tub bench;Grab bars - toilet;Grab bars - tub/shower;Hand held shower head;Wheelchair - power;Wheelchair - manual;BSC/3in1;Rollator (4 wheels);Other (comment) (knee walker) Additional Comments: reports all equipment needed is at his home    Prior Function Prior Level of Function : Driving;Needs assist       Physical Assist : Mobility (physical) Mobility (physical): Transfers   Mobility Comments: transfers to wheelchair but cannot walk ADLs Comments: reports ind     Hand Dominance   Dominant Hand: Right    Extremity/Trunk Assessment   Upper Extremity Assessment Upper Extremity Assessment: Defer to OT evaluation    Lower Extremity Assessment Lower  Extremity Assessment: Generalized weakness;RLE deficits/detail;LLE deficits/detail RLE Deficits / Details: Gross weakness, hx of Rt BKA LLE: Unable to fully assess due to pain;Unable to fully assess due to immobilization    Cervical / Trunk Assessment Cervical / Trunk Assessment: Normal  Communication   Communication: No difficulties  Cognition Arousal/Alertness: Awake/alert Behavior During Therapy: Agitated Overall Cognitive Status: Within Functional Limits for tasks assessed                                 General Comments: Easily agitated if not agreeable to the way patient wants to mobilize, despite explanation for safety needs.        General Comments General comments (skin integrity, edema, etc.): Education for positioning on Lt residual limb    Exercises     Assessment/Plan    PT Assessment Patient needs continued PT services  PT Problem List Decreased strength;Decreased range of motion;Decreased activity tolerance;Decreased mobility;Decreased balance;Decreased coordination;Decreased cognition;Decreased safety awareness;Decreased knowledge of use of DME;Cardiopulmonary status limiting activity;Pain;Obesity       PT Treatment Interventions DME instruction;Gait training;Stair training;Functional mobility training;Therapeutic exercise;Therapeutic activities;Balance training;Neuromuscular re-education;Patient/family education    PT Goals (Current goals can be found in the Care Plan section)  Acute Rehab PT Goals Patient Stated Goal: Go to rehab at [facility] PT Goal Formulation: With patient Time For Goal Achievement: 07/20/22 Potential to Achieve Goals: Fair    Frequency Min 5X/week  Co-evaluation               AM-PAC PT "6 Clicks" Mobility  Outcome Measure Help needed turning from your back to your side while in a flat bed without using bedrails?: A Lot Help needed moving from lying on your back to sitting on the side of a flat bed without using  bedrails?: A Lot Help needed moving to and from a bed to a chair (including a wheelchair)?: Total Help needed standing up from a chair using your arms (e.g., wheelchair or bedside chair)?: Total Help needed to walk in hospital room?: Total Help needed climbing 3-5 steps with a railing? : Total 6 Click Score: 8    End of Session Equipment Utilized During Treatment: Gait belt Activity Tolerance: Other (comment) (Limited by weakness) Patient left: with call bell/phone within reach;in bed;with bed alarm set Nurse Communication: Mobility status;Need for lift equipment;Patient requests pain meds (Use hoyer pad, in room.) PT Visit Diagnosis: Muscle weakness (generalized) (M62.81);Difficulty in walking, not elsewhere classified (R26.2);Pain Pain - Right/Left: Left Pain - part of body: Leg    Time: 1023-1101 PT Time Calculation (min) (ACUTE ONLY): 38 min   Charges:   PT Evaluation $PT Eval Moderate Complexity: 1 Mod PT Treatments $Therapeutic Activity: 8-22 mins        Candie Mile, PT, DPT Physical Therapist Acute Rehabilitation Services [place] 07/06/2022, 11:53 AM

## 2022-07-07 LAB — GLUCOSE, CAPILLARY
Glucose-Capillary: 231 mg/dL — ABNORMAL HIGH (ref 70–99)
Glucose-Capillary: 151 mg/dL — ABNORMAL HIGH (ref 70–99)
Glucose-Capillary: 214 mg/dL — ABNORMAL HIGH (ref 70–99)

## 2022-07-07 MED ORDER — DOXERCALCIFEROL 4 MCG/2ML IV SOLN
INTRAVENOUS | Status: AC
  Administered 2022-07-07: 4 ug
  Filled 2022-07-07: qty 2

## 2022-07-07 NOTE — Progress Notes (Addendum)
[facility] Progress Note  Name: [name] MRN: [id number] DOB: [birth year]  Chief Complaint:  Chest pain before planned BKA  Subjective:  Last HD on 10/26 with 2.6 kg UF.  Strict ins/outs not available.  3 unmeasured urine voids over 10/27 and had 600 mL uop over 10/28 thus far.  Feels ok today.  Hasn't had HD yet today.   Review of systems:   Denies n/v  Denies any shortness of breath  ------------- Background on consult:  [name] is a 66 y.o. male with a history including ESRD, DM, HTN, PAD, and sleep apnea who presented to the hospital for chest pain and tightness.  "Not like an elephant".  He was recently discharged after treatment for an abscess and osteomyelitis of the left foot.  He had a planned amputation scheduled for the day after presentation to the ER.  Ortho was consulted and he underwent left BKA this am.  Nephrology is consulted for assistance with management of ESRD.  He had HD off schedule on Friday to allow him to attend his daughter's wedding this past Saturday.  He missed HD on Tuesday because he felt poorly and later presented to the ER.  He still makes quite a bit of urine.  He had fluids ordered per primary team.  Right leg prosthesis doesn't fit great - he states takes multiple (4-6 pair) of socks to wear it.  He has lost leg circumference since it was fitted.     Intake/Output Summary (Last 24 hours) at 07/07/2022 1352 Last data filed at 07/07/2022 0900 Gross per 24 hour  Intake 120 ml  Output 600 ml  Net -480 ml    Vitals:  Vitals:   07/06/22 0733 07/06/22 1948 07/07/22 0424 07/07/22 0751  BP: (!) 117/50 (!) 160/61 (!) 138/58 (!) 155/63  Pulse: 62 81 73 75  Resp: '16 17 17 18  '$ Temp: 99.3 F (37.4 C) 98.3 F (36.8 C)  98.2 F (36.8 C)  TempSrc: Oral Oral    SpO2: 97% 99% 100% 99%  Weight:         Physical Exam:       General: adult male in bed in NAD  HEENT: NCAT Eyes: EOMI sclera anicteric Neck: supple trachea midline   Heart: S1S2 no rub Lungs: clear and unlabored on room air  Abdomen: soft/nt/nd; obese habitus Extremities: right BKA - healed and no edema of residual limb; left leg new BKA with protective device  Skin: no rash on extremities exposed Neuro: alert and oriented x 3 provides hx and follows commands  Access: left AVF with bruit and thrill   Medications reviewed   Labs:     Latest Ref Rng & Units 07/06/2022    5:01 AM 07/05/2022    3:52 AM 07/04/2022    6:52 PM  BMP  Glucose 70 - 99 mg/dL 238  344  530   BUN 8 - 23 mg/dL 31  56    Creatinine 0.61 - 1.24 mg/dL 6.25  8.99    Sodium 135 - 145 mmol/L 135  131    Potassium 3.5 - 5.1 mmol/L 3.8  4.9    Chloride 98 - 111 mmol/L 94  95    CO2 22 - 32 mmol/L 27  21    Calcium 8.9 - 10.3 mg/dL 7.2  6.8     Outpatient HD orders:  [place] [facility]  4 hours and 15 minutes BF 450; DF 800  EDW 112 kg  2K/2Ca  AVF  Meds: mircera 100 mcg every 4 weeks (to be given on 10/28 - last outpatient ESA mircera 30 mcg on 06/16/22).  hectorol 4 mcg every tx;  Last outpatient HD on 10/12     Assessment/Plan:   # Osteomyelitis left foot - s/p left BKA on 19/62/22   # PAD  - complicates wound healing; note BKA as above - I have encouraged him to speak with his team about his right leg prosthesis not fitting well    # ESRD  - HD per TTS schedule  - strict ins/outs  - he is on midodrine pre-HD per his established regimen - note he will have a lower EDW s/p the left BKA   # HTN  - Acceptable    # Anemia CKD  - Note ESA is due 07/07/22 per outpatient orders - will give aranesp 100 mcg on 10/28 as planned then weekly on Saturdays while here    # Metabolic bone disease  - continue hectorol (to be given with HD - please do not list on outpatient med list)    Disposition per primary team.  He is very motivated to get rehab and to improve mobility  Harold Desanctis, MD 07/07/2022 1:52 PM

## 2022-07-07 NOTE — Progress Notes (Signed)
Patient ID: MARKHAM DUMLAO, male   DOB: 09/07/1956, 66 y.o.   MRN: [id number] Patient is status post left transtibial amputation.  There is no drainage in the wound VAC canister there is a good suction fit.  Discharge planning underway for either discharge to skilled nursing or inpatient rehab.

## 2022-07-07 NOTE — Progress Notes (Signed)
Inpatient Rehab Admissions Coordinator:    I spoke with pt.'s wife regarding CIR vs SNF. She states that she is leaning towards SNF but has not had time to talk it over with the Pt.. States she will get back with me tomorrow.   Clemens Catholic, [place], [place] Admissions Coordinator  [phone] ([place]) [phone] (office)

## 2022-07-07 NOTE — Progress Notes (Signed)
Physical Therapy Treatment Patient Details Name: [name] MRN: [id number] DOB: [birth year] Today's Date: 07/07/2022   History of Present Illness 66 yo male Admitted 10/24 for  dx sepsis with lactic acidosis. 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 S/p Lt BKA 10/25.    PT Comments    Pt pleasant and motivated for session with session focused on sitting balance and lateral scoot transfers. Transfer to chair deferred this session per RN request as HD transport on way.  Pt able to come to sitting EOB needing mod assist to maintain at start but progressing to min guard throughout session. Pt unable to come to standing with RW with little to no power up noted over multiple trails. Pt able to laterally scoot toward [facility] with max assist with good head hips relationship noted. Pt able to verbalize education on importance of L knee extension when resting in long sitting/supine and importance of continued mobility. Current plan remains appropriate to address deficits and maximize functional independence and decrease caregiver burden.  Pt continues to benefit from skilled PT services to progress toward functional mobility goals.    Recommendations for follow up therapy are one component of a multi-disciplinary discharge planning process, led by the attending physician.  Recommendations may be updated based on patient status, additional functional criteria and insurance authorization.  Follow Up Recommendations  Acute inpatient rehab (3hours/day) Can patient physically be transported by private vehicle: No   Assistance Recommended at Discharge Frequent or constant Supervision/Assistance  Patient can return home with the following Help with stairs or ramp for entrance;Assist for transportation;Two people to  help with walking and/or transfers;Two people to help with bathing/dressing/bathroom;Assistance with cooking/housework   Equipment Recommendations  None recommended by PT    Recommendations for Other Services       Precautions / Restrictions Precautions Precautions: Fall;Knee Precaution Comments: Lt BKA precautions reviewed, Required Braces or Orthoses:  (Limb protector Lt) Restrictions Weight Bearing Restrictions: No Other Position/Activity Restrictions: has RLE prosthetic     Mobility  Bed Mobility Overal bed mobility: Needs Assistance Bed Mobility: Supine to Sit, Sit to Supine   Sidelying to sit: Independent Supine to sit: HOB elevated, Mod assist Sit to supine: Min assist   General bed mobility comments: assist for LEs and to elevate trunk, pt with shaking and jerking of LUE with x2 lateral LOB in sitting needing assist to correct to midline. pt able to maintain EOB sitting ~15 mins unassisted    Transfers Overall transfer level: Needs assistance Equipment used: Rolling walker (2 wheels) Transfers: Sit to/from Stand Sit to Stand: Total assist, From elevated surface          Lateral/Scoot Transfers: Max assist General transfer comment: attempted to stand without success pt with no power up and unable to raise hips, able to lateral scoot with step by step cues to left (HOB) with max asssit    Ambulation/Gait               General Gait Details: Non-ambulatory at baseline.   Stairs             Wheelchair Mobility    Modified Rankin (Stroke Patients Only)       Balance Overall balance assessment: Needs assistance Sitting-balance support: Feet supported, No upper extremity supported, Feet unsupported Sitting balance-Leahy Scale: Fair  Sitting balance - Comments: Sat EOB approx 15 min working on seated balance, transfers, scooting. initially required assist for balance but later progressed to unsupported sitting and reaching. Postural control:  Left lateral lean, Posterior lean                                  Cognition Arousal/Alertness: Awake/alert Behavior During Therapy: WFL for tasks assessed/performed Overall Cognitive Status: Within Functional Limits for tasks assessed                                          Exercises      General Comments        Pertinent Vitals/Pain Pain Assessment Pain Assessment: Faces Faces Pain Scale: Hurts little more Pain Location: Lt residual limb Pain Descriptors / Indicators: Grimacing, Guarding Pain Intervention(s): Monitored during session, Limited activity within patient's tolerance    Home Living                          Prior Function            PT Goals (current goals can now be found in the care plan section) Acute Rehab PT Goals PT Goal Formulation: With patient Time For Goal Achievement: 07/20/22    Frequency    Min 5X/week      PT Plan Current plan remains appropriate    Co-evaluation              AM-PAC PT "6 Clicks" Mobility   Outcome Measure  Help needed turning from your back to your side while in a flat bed without using bedrails?: A Lot Help needed moving from lying on your back to sitting on the side of a flat bed without using bedrails?: A Lot Help needed moving to and from a bed to a chair (including a wheelchair)?: Total Help needed standing up from a chair using your arms (e.g., wheelchair or bedside chair)?: Total Help needed to walk in hospital room?: Total Help needed climbing 3-5 steps with a railing? : Total 6 Click Score: 8    End of Session   Activity Tolerance: Other (comment) (Limited by weakness) Patient left: with call bell/phone within reach;in bed;with bed alarm set Nurse Communication: Mobility status PT Visit Diagnosis: Muscle weakness (generalized) (M62.81);Difficulty in walking, not elsewhere classified (R26.2);Pain Pain - Right/Left: Left Pain - part of body: Leg      Time: 1421-1446 PT Time Calculation (min) (ACUTE ONLY): 25 min  Charges:  $Therapeutic Activity: 23-37 mins                     Cleavon Goldman R. PTA Acute Rehabilitation Services Office: [place] 07/07/2022, 3:26 PM

## 2022-07-07 NOTE — Plan of Care (Signed)
  Problem: Clinical Measurements: Goal: Diagnostic test results will improve Outcome: Progressing   Problem: Coping: Goal: Ability to adjust to condition or change in health will improve Outcome: Progressing   Problem: Metabolic: Goal: Ability to maintain appropriate glucose levels will improve Outcome: Progressing   Problem: Nutritional: Goal: Maintenance of adequate nutrition will improve Outcome: Progressing

## 2022-07-08 LAB — GLUCOSE, CAPILLARY
Glucose-Capillary: 163 mg/dL — ABNORMAL HIGH (ref 70–99)
Glucose-Capillary: 269 mg/dL — ABNORMAL HIGH (ref 70–99)
Glucose-Capillary: 262 mg/dL — ABNORMAL HIGH (ref 70–99)

## 2022-07-08 MED ORDER — INSULIN ASPART 100 UNIT/ML IJ SOLN
3.0000 [IU] | Freq: Once | INTRAMUSCULAR | Status: AC
  Administered 2022-07-08: 3 [IU] via SUBCUTANEOUS

## 2022-07-08 NOTE — Progress Notes (Addendum)
[facility] Progress Note  Name: [name] MRN: [id number] DOB: April 22, 1956  Chief Complaint:  Chest pain before planned BKA  Subjective:  Last HD on 10/28 with 4 kg UF.  600 ml uop over 10/28 documented.  Per charting they are pursuing SNF over CIR.  He is frustrated about his outpatient dialysis time and I let him know that they would need to address that outpatient (it's 4 hours and 15 minutes).   No idea what his weight is as it has gone up post-op and he seems to have been weighed with all equipment, etc on bed.   Review of systems:  Denies n/v  Denies any shortness of breath  ------------- Background on consult:  [name] is a 66 y.o. male with a history including ESRD, DM, HTN, PAD, and sleep apnea who presented to the hospital for chest pain and tightness.  "Not like an elephant".  He was recently discharged after treatment for an abscess and osteomyelitis of the left foot.  He had a planned amputation scheduled for the day after presentation to the ER.  Ortho was consulted and he underwent left BKA this am.  Nephrology is consulted for assistance with management of ESRD.  He had HD off schedule on Friday to allow him to attend his daughter's wedding this past Saturday.  He missed HD on Tuesday because he felt poorly and later presented to the ER.  He still makes quite a bit of urine.  He had fluids ordered per primary team.  Right leg prosthesis doesn't fit great - he states takes multiple (4-6 pair) of socks to wear it.  He has lost leg circumference since it was fitted.     Intake/Output Summary (Last 24 hours) at 07/08/2022 1256 Last data filed at 07/07/2022 2141 Gross per 24 hour  Intake --  Output 4000 ml  Net -4000 ml    Vitals:  Vitals:   07/07/22 2123 07/07/22 2125 07/07/22 2319 07/08/22 0700  BP:   (!) 140/52 (!) 134/51  Pulse:   92 71  Resp:   18 18  Temp:   99.6 F (37.6 C) 99.3 F (37.4 C)  TempSrc:   Oral Oral  SpO2:   98% 96%   Weight: 124.6 kg 124.6 kg       Physical Exam:       General: adult male in bed in NAD  HEENT: NCAT Eyes: EOMI sclera anicteric Neck: supple trachea midline  Heart: S1S2 no rub Lungs: clear and unlabored on room air  Abdomen: soft/nt/nd; obese habitus Extremities: right BKA - healed and no edema of residual limb; left leg BKA with protective device  Skin: no rash on extremities exposed Neuro: alert and oriented x 3 provides hx and follows commands  Access: left AVF with bruit and thrill   Medications reviewed   Labs:     Latest Ref Rng & Units 07/06/2022    5:01 AM 07/05/2022    3:52 AM 07/04/2022    6:52 PM  BMP  Glucose 70 - 99 mg/dL 238  344  530   BUN 8 - 23 mg/dL 31  56    Creatinine 0.61 - 1.24 mg/dL 6.25  8.99    Sodium 135 - 145 mmol/L 135  131    Potassium 3.5 - 5.1 mmol/L 3.8  4.9    Chloride 98 - 111 mmol/L 94  95    CO2 22 - 32 mmol/L 27  21    Calcium  8.9 - 10.3 mg/dL 7.2  6.8     Outpatient HD orders:  [place] [facility]  4 hours and 15 minutes BF 450; DF 800  EDW 112 kg  2K/2Ca AVF  Meds: mircera 100 mcg every 4 weeks (to be given on 10/28 - last outpatient ESA mircera 30 mcg on 06/16/22).  hectorol 4 mcg every tx;  Last outpatient HD on 10/12     Assessment/Plan:   # Osteomyelitis left foot - s/p left BKA on 10/93/23   # PAD  - complicates wound healing; note BKA as above - I have encouraged him to speak with his team about his right leg prosthesis not fitting well    # ESRD  - HD per TTS schedule  - strict ins/outs  - he is on midodrine pre-HD per his established regimen - note he will have a lower EDW s/p the left BKA; do not trust hospital reported weights.  He has a large brace on his leg.  I spoke with his RN and she is going to get a weight without the equipment in the bed (brace may need to stay on)  - renal panel in am    # HTN  - Acceptable    # Anemia CKD  - Note ESA is due 07/07/22 per outpatient orders.  Started on  aranesp 100 mcg weekly on Saturdays while here   # Metabolic bone disease  - continue hectorol (to be given with HD - please do not list on outpatient med list)    Disposition per primary team.  Looking for SNF per charting  Claudia Desanctis, MD 07/08/2022 1:11 PM

## 2022-07-08 NOTE — Plan of Care (Signed)
  Problem: Health Behavior/Discharge Planning: Goal: Ability to manage health-related needs will improve Outcome: Progressing   Problem: Metabolic: Goal: Ability to maintain appropriate glucose levels will improve Outcome: Progressing   Problem: Nutritional: Goal: Maintenance of adequate nutrition will improve Outcome: Progressing   Problem: Skin Integrity: Goal: Risk for impaired skin integrity will decrease Outcome: Progressing

## 2022-07-08 NOTE — Progress Notes (Signed)
Inpatient Rehab Admissions Coordinator:   I spoke with Pt.'s wife regarding CIR vs. SNF. Feels that she cannot care for pt after short stay on CIR. Pt. Will need SNF.   Clemens Catholic, [place], [place] Admissions Coordinator  [phone] ([place]) [phone] (office)

## 2022-07-08 NOTE — Progress Notes (Signed)
Subjective: 4 Days Post-Op Procedure(s) (LRB): LEFT BELOW KNEE AMPUTATION (Left) Patient reports pain as moderate.    Objective: Vital signs in last 24 hours: Temp:  [98.6 F (37 C)-99.6 F (37.6 C)] 99.3 F (37.4 C) (10/29 0700) Pulse Rate:  [64-117] 71 (10/29 0700) Resp:  [14-20] 18 (10/29 0700) BP: (131-164)/(51-82) 134/51 (10/29 0700) SpO2:  [96 %-100 %] 96 % (10/29 0700) Weight:  [124.6 kg] 124.6 kg (10/28 2125)  Intake/Output from previous day: 10/28 0701 - 10/29 0700 In: 120 [P.O.:120] Out: 4600 [Urine:600] Intake/Output this shift: No intake/output data recorded.  Recent Labs    07/06/22 0501  HGB 8.8*   Recent Labs    07/06/22 0501  WBC 8.3  RBC 2.82*  HCT 25.7*  PLT 205   Recent Labs    07/06/22 0501  NA 135  K 3.8  CL 94*  CO2 27  BUN 31*  CREATININE 6.25*  GLUCOSE 238*  CALCIUM 7.2*   No results for input(s): "LABPT", "INR" in the last 72 hours. PE Wound vac in place with good and functioning properly.  No fluid in canister    Assessment/Plan: 4 Days Post-Op Procedure(s) (LRB): LEFT BELOW KNEE AMPUTATION (Left) PLAN NWB LLE Continue with wound vac D/c dispo pending- SNF vs CIR       Aundra Dubin 07/08/2022, 8:36 AM

## 2022-07-09 LAB — RENAL FUNCTION PANEL
Albumin: 2.3 g/dL — ABNORMAL LOW (ref 3.5–5.0)
Anion gap: 15 (ref 5–15)
BUN: 43 mg/dL — ABNORMAL HIGH (ref 8–23)
CO2: 26 mmol/L (ref 22–32)
Calcium: 7.7 mg/dL — ABNORMAL LOW (ref 8.9–10.3)
Chloride: 93 mmol/L — ABNORMAL LOW (ref 98–111)
Creatinine, Ser: 6.57 mg/dL — ABNORMAL HIGH (ref 0.61–1.24)
GFR, Estimated: 9 mL/min — ABNORMAL LOW (ref >60)
Glucose, Bld: 172 mg/dL — ABNORMAL HIGH (ref 70–99)
Phosphorus: 6.4 mg/dL — ABNORMAL HIGH (ref 2.5–4.6)
Potassium: 4.2 mmol/L (ref 3.5–5.1)
Sodium: 134 mmol/L — ABNORMAL LOW (ref 135–145)

## 2022-07-09 LAB — GLUCOSE, CAPILLARY
Glucose-Capillary: 151 mg/dL — ABNORMAL HIGH (ref 70–99)
Glucose-Capillary: 171 mg/dL — ABNORMAL HIGH (ref 70–99)
Glucose-Capillary: 127 mg/dL — ABNORMAL HIGH (ref 70–99)
Glucose-Capillary: 145 mg/dL — ABNORMAL HIGH (ref 70–99)

## 2022-07-09 MED ORDER — FENTANYL 50 MCG/HR TD PT72
1.0000 | MEDICATED_PATCH | TRANSDERMAL | Status: DC
  Administered 2022-07-09 – 2022-07-15 (×3): 1 via TRANSDERMAL
  Filled 2022-07-09 (×4): qty 1

## 2022-07-09 MED ORDER — INSULIN GLARGINE-YFGN 100 UNIT/ML ~~LOC~~ SOLN
35.0000 [IU] | Freq: Every day | SUBCUTANEOUS | Status: DC
  Administered 2022-07-09 – 2022-07-16 (×8): 35 [IU] via SUBCUTANEOUS
  Filled 2022-07-09 (×8): qty 0.35

## 2022-07-09 NOTE — Plan of Care (Signed)
  Problem: Fluid Volume: Goal: Hemodynamic stability will improve Outcome: Progressing   Problem: Clinical Measurements: Goal: Diagnostic test results will improve Outcome: Progressing Goal: Signs and symptoms of infection will decrease Outcome: Progressing   Problem: Respiratory: Goal: Ability to maintain adequate ventilation will improve Outcome: Progressing   Problem: Education: Goal: Ability to describe self-care measures that may prevent or decrease complications (Diabetes Survival Skills Education) will improve Outcome: Progressing Goal: Individualized Educational Video(s) Outcome: Progressing   Problem: Coping: Goal: Ability to adjust to condition or change in health will improve Outcome: Progressing   Problem: Fluid Volume: Goal: Ability to maintain a balanced intake and output will improve Outcome: Progressing   Problem: Health Behavior/Discharge Planning: Goal: Ability to identify and utilize available resources and services will improve Outcome: Progressing Goal: Ability to manage health-related needs will improve Outcome: Progressing   Problem: Metabolic: Goal: Ability to maintain appropriate glucose levels will improve Outcome: Progressing   Problem: Nutritional: Goal: Maintenance of adequate nutrition will improve Outcome: Progressing Goal: Progress toward achieving an optimal weight will improve Outcome: Progressing   Problem: Skin Integrity: Goal: Risk for impaired skin integrity will decrease Outcome: Progressing   Problem: Tissue Perfusion: Goal: Adequacy of tissue perfusion will improve Outcome: Progressing   Problem: Education: Goal: Knowledge of the prescribed therapeutic regimen will improve Outcome: Progressing Goal: Ability to verbalize activity precautions or restrictions will improve Outcome: Progressing Goal: Understanding of discharge needs will improve Outcome: Progressing   Problem: Activity: Goal: Ability to perform//tolerate  increased activity and mobilize with assistive devices will improve Outcome: Progressing   Problem: Clinical Measurements: Goal: Postoperative complications will be avoided or minimized Outcome: Progressing   Problem: Self-Care: Goal: Ability to meet self-care needs will improve Outcome: Progressing   Problem: Self-Concept: Goal: Ability to maintain and perform role responsibilities to the fullest extent possible will improve Outcome: Progressing   Problem: Pain Management: Goal: Pain level will decrease with appropriate interventions Outcome: Progressing   Problem: Skin Integrity: Goal: Demonstration of wound healing without infection will improve Outcome: Progressing   Problem: Education: Goal: Knowledge of General Education information will improve Description: Including pain rating scale, medication(s)/side effects and non-pharmacologic comfort measures Outcome: Progressing   Problem: Health Behavior/Discharge Planning: Goal: Ability to manage health-related needs will improve Outcome: Progressing   Problem: Clinical Measurements: Goal: Ability to maintain clinical measurements within normal limits will improve Outcome: Progressing Goal: Will remain free from infection Outcome: Progressing Goal: Diagnostic test results will improve Outcome: Progressing Goal: Respiratory complications will improve Outcome: Progressing Goal: Cardiovascular complication will be avoided Outcome: Progressing   Problem: Activity: Goal: Risk for activity intolerance will decrease Outcome: Progressing   Problem: Nutrition: Goal: Adequate nutrition will be maintained Outcome: Progressing   Problem: Coping: Goal: Level of anxiety will decrease Outcome: Progressing   Problem: Elimination: Goal: Will not experience complications related to bowel motility Outcome: Progressing Goal: Will not experience complications related to urinary retention Outcome: Progressing   Problem: Pain  Managment: Goal: General experience of comfort will improve Outcome: Progressing   Problem: Safety: Goal: Ability to remain free from injury will improve Outcome: Progressing   Problem: Skin Integrity: Goal: Risk for impaired skin integrity will decrease Outcome: Progressing

## 2022-07-09 NOTE — Progress Notes (Signed)
[facility] Progress Note   Subjective:Seen in room, asking about need to pull more fluid since he's lost his leg. Assured him we'd adjust EDW appropriately. If weights are correct, he is still 12.6 kg above OP EDW. Denies SOB.      Objective Vitals:   07/08/22 1718 07/08/22 2030 07/09/22 0721 07/09/22 1423  BP: (!) 128/54 (!) 136/52 (!) 124/56 (!) 115/55  Pulse: 79 77 66 68  Resp: '18 18 18 19  '$ Temp: 99.1 F (37.3 C) 98.8 F (37.1 C) 98.8 F (37.1 C) 99 F (37.2 C)  TempSrc: Oral Oral Oral Oral  SpO2: 96% 98% 100% 97%  Weight:       Physical Exam General: Chronically ill appearing male in NAD Heart: S1,S2 RRR No M/R/G Lungs: CTAB A/P Abdomen: Obese, NABS Extremities: L BKA in stump protector. Wound vac in place. R BKA with prosthetic sleeve in place.  Dialysis Access: L AVF + T/B    Additional Objective Labs: Basic Metabolic Panel: Recent Labs  Lab 07/04/22 1146 07/04/22 1852 07/05/22 0352 07/06/22 0501 07/09/22 0315  NA 134*  --  131* 135 134*  K 4.6  --  4.9 3.8 4.2  CL 100  --  95* 94* 93*  CO2 21*  --  21* 27 26  GLUCOSE 274*   < > 344* 238* 172*  BUN 49*  --  56* 31* 43*  CREATININE 8.63*  --  8.99* 6.25* 6.57*  CALCIUM 6.9*  --  6.8* 7.2* 7.7*  PHOS 6.6*  --   --   --  6.4*   < > = values in this interval not displayed.   Liver Function Tests: Recent Labs  Lab 07/03/22 1255 07/04/22 1146 07/09/22 0315  AST 13*  --   --   ALT 6  --   --   ALKPHOS 122  --   --   BILITOT 0.4  --   --   PROT 5.9*  --   --   ALBUMIN 2.9* 2.7* 2.3*   No results for input(s): "LIPASE", "AMYLASE" in the last 168 hours. CBC: Recent Labs  Lab 07/03/22 1255 07/04/22 1146 07/05/22 0352 07/06/22 0501  WBC 6.2 8.9 8.4 8.3  HGB 9.9* 9.7* 8.4* 8.8*  HCT 28.9* 27.7* 24.6* 25.7*  MCV 92.3 90.5 91.4 91.1  PLT 213 219 229 205   Blood Culture    Component Value Date/Time   SDES BLOOD RIGHT FOREARM 06/25/2022 1258   SPECREQUEST  06/25/2022 1258     BOTTLES DRAWN AEROBIC AND ANAEROBIC Blood Culture adequate volume   CULT  06/25/2022 1258    NO GROWTH 5 DAYS Performed at [facility], [place] [address], [place], [place] [address]    REPTSTATUS 06/30/2022 FINAL 06/25/2022 1258    Cardiac Enzymes: No results for input(s): "CKTOTAL", "CKMB", "CKMBINDEX", "TROPONINI" in the last 168 hours. CBG: Recent Labs  Lab 07/08/22 1159 07/08/22 1711 07/08/22 2026 07/09/22 0725 07/09/22 1359  GLUCAP 163* 269* 262* 151* 171*   Iron Studies: No results for input(s): "IRON", "TIBC", "TRANSFERRIN", "FERRITIN" in the last 72 hours. '@lablastinr3'$ @ Studies/Results: No results found. Medications:   ceFAZolin (ANCEF) IV 2 g (07/08/22 1335)   gentamicin 100 mg (07/08/22 1411)    amiodarone  100 mg Oral Daily   apixaban  5 mg Oral BID   atorvastatin  40 mg Oral QHS   Chlorhexidine Gluconate Cloth  6 each Topical Q0600   darbepoetin (ARANESP) injection - DIALYSIS  100 mcg Subcutaneous  Q Sat-1800   docusate sodium  100 mg Oral Daily   doxercalciferol  4 mcg Oral Q T,Th,Sa-HD   fentaNYL  1 patch Transdermal Q72H   insulin aspart  0-6 Units Subcutaneous TID WC   insulin aspart  3 Units Subcutaneous TID WC   insulin glargine-yfgn  35 Units Subcutaneous QHS   midodrine  10 mg Oral Q T,Th,Sa-HD   nutrition supplement (JUVEN)  1 packet Oral BID BM   pantoprazole  40 mg Oral Daily   pregabalin  100 mg Oral BID   rOPINIRole  2 mg Oral QHS   tamsulosin  0.4 mg Oral Daily   zinc sulfate  220 mg Oral Daily     Outpatient HD orders:  [place]  TTS  4:15 450/800 112 kg 2.0 K/2.0 Ca AVF - No heparin  - mircera 100 mcg every 4 weeks (to be given on 10/28 - last outpatient  - hectorol 4 mcg every tx;  Last outpatient HD on 10/12      Assessment/Plan:    # 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 Not sure is weights are being done with or without brace. UF and lower volume as tolerated.    # HTN - Acceptable. On Midodrine.    # Anemia CKD  - Note ESA is due 07/07/22 per outpatient orders.  Started on aranesp 100 mcg weekly on Saturdays while here   # Metabolic bone disease  - continue hectorol (to be given with HD - please do not list on outpatient med list)    Disposition per primary team.  Looking for SNF per charting  Alejandra Barna H. Zariel Capano NP-C 07/09/2022, 3:02 PM  [employer] [phone]

## 2022-07-09 NOTE — Progress Notes (Signed)
Physical Therapy Treatment Patient Details Name: [name] MRN: [id number] DOB: 07-Nov-1955 Today's Date: 07/09/2022   History of Present Illness 66 yo male Admitted 10/24 for  dx sepsis with lactic acidosis. 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 S/p Lt BKA 10/25.    PT Comments    Pt was seen for progressing his mobility on the side of the bed to scoot up the bed, to attempt to stand and to encourage AROM to LLE with exercises.  Pt is aware of the plan for therapy but seems to be a bit distracted and is certainly having some pain that is making more therapy a challenge.  Talked with pt about how much to move and determined he did not want to get up to the chair or do more aggressive therapy.  Tolerating ROM to LLE, able to extend R knee on the bed to encourage hamstring stretch.  Follow along with pt for return to transfers, preparing for a prosthesis and for strengthening to BLE's as well as increasing ROM.    Recommendations for follow up therapy are one component of a multi-disciplinary discharge planning process, led by the attending physician.  Recommendations may be updated based on patient status, additional functional criteria and insurance authorization.  Follow Up Recommendations  Acute inpatient rehab (3hours/day) Can patient physically be transported by private vehicle: No   Assistance Recommended at Discharge Frequent or constant Supervision/Assistance  Patient can return home with the following Help with stairs or ramp for entrance;Assist for transportation;Two people to help with walking and/or transfers;Two people to help with bathing/dressing/bathroom;Assistance with cooking/housework   Equipment Recommendations  None recommended by PT    Recommendations for Other Services  Rehab consult     Precautions / Restrictions Precautions Precautions: Fall;Knee Precaution Comments: Lt BKA precautions reviewed, Required Braces or Orthoses: Other Brace Other Brace: limb protector Restrictions Weight Bearing Restrictions: Yes LLE Weight Bearing: Non weight bearing     Mobility  Bed Mobility Overal bed mobility: Needs Assistance Bed Mobility: Supine to Sit, Sit to Supine     Supine to sit: Mod assist Sit to supine: Min assist   General bed mobility comments: LE support back to bed and pt is more capable to assist with trunk    Transfers Overall transfer level: Needs assistance Equipment used: 1 person hand held assist Transfers: Bed to chair/wheelchair/BSC            Lateral/Scoot Transfers: Mod assist General transfer comment: pt was unable to stand up, would be total assist to get up    Ambulation/Gait               General Gait Details: Non-ambulatory at baseline.   Stairs             Wheelchair Mobility    Modified Rankin (Stroke Patients Only)       Balance Overall balance assessment: Needs assistance Sitting-balance support: Feet supported Sitting balance-Leahy Scale: Fair Sitting balance - Comments: extended time to balance and use UE's for scooting as well as participating in Ther ex to LLE                                    Cognition Arousal/Alertness: Awake/alert Behavior During Therapy:  WFL for tasks assessed/performed Overall Cognitive Status: No family/caregiver present to determine baseline cognitive functioning                                 General Comments: discussion about his mobility, which pt is partially understanding        Exercises General Exercises - Lower Extremity Quad Sets: AROM, 5 reps Long Arc Quad: AROM, 10 reps Heel Slides: AROM, 10 reps Amputee Exercises Knee Extension: AROM, AAROM, Right, Supine    General Comments General comments (skin  integrity, edema, etc.): Pt worked on sitting balance with at least fair balance initially then mod assist to help scoot up side of bed.  Pt is unable to generate enough lift from bed with UE's and LLE prosthesis to stand with PT      Pertinent Vitals/Pain Pain Assessment Pain Assessment: Faces Faces Pain Scale: Hurts little more Pain Location: Lt residual limb Pain Descriptors / Indicators: Guarding, Grimacing Pain Intervention(s): Limited activity within patient's tolerance, Monitored during session, Premedicated before session, Repositioned    Home Living                          Prior Function            PT Goals (current goals can now be found in the care plan section) Acute Rehab PT Goals Patient Stated Goal: Go to rehab at [facility] Progress towards PT goals: Progressing toward goals    Frequency    Min 5X/week      PT Plan Current plan remains appropriate    Co-evaluation              AM-PAC PT "6 Clicks" Mobility   Outcome Measure  Help needed turning from your back to your side while in a flat bed without using bedrails?: A Lot Help needed moving from lying on your back to sitting on the side of a flat bed without using bedrails?: A Lot Help needed moving to and from a bed to a chair (including a wheelchair)?: A Lot Help needed standing up from a chair using your arms (e.g., wheelchair or bedside chair)?: Total Help needed to walk in hospital room?: Total Help needed climbing 3-5 steps with a railing? : Total 6 Click Score: 9    End of Session Equipment Utilized During Treatment: Gait belt Activity Tolerance: Patient limited by fatigue;Patient limited by pain Patient left: in bed;with call bell/phone within reach;with bed alarm set Nurse Communication: Mobility status PT Visit Diagnosis: Muscle weakness (generalized) (M62.81);Difficulty in walking, not elsewhere classified (R26.2);Pain Pain - Right/Left: Left Pain - part of body: Leg      Time: 1229-1253 PT Time Calculation (min) (ACUTE ONLY): 24 min  Charges:  $Therapeutic Exercise: 8-22 mins $Therapeutic Activity: 8-22 mins     Ramond Dial 07/09/2022, 2:47 PM  Mee Hives, PT PhD Acute Rehab Dept. Number: [place] [facility] [place]

## 2022-07-09 NOTE — Progress Notes (Signed)
Occupational Therapy Treatment Patient Details Name: [name] MRN: [id number] DOB: 09/03/1956 Today's Date: 07/09/2022   History of present illness 66 yo male Admitted 10/24 for  dx sepsis with lactic acidosis. 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 S/p Lt BKA 10/25.   OT comments  Patient received in supine and agreeable to OT session. Patient required mod assist to get to EOB with HOB raised. Patient performed grooming and LB peri area bathing while seated on EOB.  Lateral scooting performed from EOB to right and left to prepare for transfers with patient requiring mod assist due to difficulty weight shifting. Patient was min assist to return to supine with assistance to raise RLE with prosthesis. Discharge recommendations continue to be appropriate. Acute OT to continue to follow.    Recommendations for follow up therapy are one component of a multi-disciplinary discharge planning process, led by the attending physician.  Recommendations may be updated based on patient status, additional functional criteria and insurance authorization.    Follow Up Recommendations  Acute inpatient rehab (3hours/day) (if family is able to provided 24/7 and min-mod assist)    Assistance Recommended at Discharge Frequent or constant Supervision/Assistance  Patient can return home with the following  A little help with walking and/or transfers;A little help with bathing/dressing/bathroom;Assistance with cooking/housework;Direct supervision/assist for financial management;Direct supervision/assist for medications management;Assist for transportation;Help with stairs or ramp for entrance   Equipment Recommendations  Other (comment) (TBD)    Recommendations for Other Services      Precautions / Restrictions  Precautions Precautions: Fall;Knee Precaution Comments: Lt BKA precautions reviewed, Required Braces or Orthoses: Other Brace Other Brace: limb protector Restrictions Weight Bearing Restrictions: Yes LLE Weight Bearing: Non weight bearing       Mobility Bed Mobility Overal bed mobility: Needs Assistance Bed Mobility: Supine to Sit, Sit to Supine     Supine to sit: Mod assist, HOB elevated Sit to supine: Min assist   General bed mobility comments: increased time and assistance to reaise trunk    Transfers Overall transfer level: Needs assistance Equipment used: 1 person hand held assist              Lateral/Scoot Transfers: Mod assist General transfer comment: lateral scooting performed while on EOB with mod assist and cues for leaning forward     Balance Overall balance assessment: Needs assistance Sitting-balance support: Feet supported Sitting balance-Leahy Scale: Fair Sitting balance - Comments: patient likes to lean on one elbow for support and required cues to sit upright unsupported                                   ADL either performed or assessed with clinical judgement   ADL Overall ADL's : Needs assistance/impaired     Grooming: Wash/dry hands;Wash/dry face;Set up;Sitting Grooming Details (indicate cue type and reason): on EOB     Lower Body Bathing: Moderate assistance;Sitting/lateral leans Lower Body Bathing Details (indicate cue type and reason): lateral leans to clean bottom while seated on EOB                       General ADL Comments: address grooming, LB bathing, and scooting while on EOB    Extremity/Trunk Assessment  Vision       Perception     Praxis      Cognition Arousal/Alertness: Awake/alert Behavior During Therapy: WFL for tasks assessed/performed Overall Cognitive Status: No family/caregiver present to determine baseline cognitive functioning                                  General Comments: discussion about his mobility, which pt is partially understanding        Exercises      Shoulder Instructions       General Comments Pt worked on sitting balance with at least fair balance initially then mod assist to help scoot up side of bed.  Pt is unable to generate enough lift from bed with UE's and LLE prosthesis to stand with PT    Pertinent Vitals/ Pain       Pain Assessment Pain Assessment: Faces Faces Pain Scale: Hurts little more Pain Location: Lt residual limb Pain Descriptors / Indicators: Guarding, Grimacing Pain Intervention(s): Limited activity within patient's tolerance, Monitored during session, Premedicated before session, Repositioned  Home Living                                          Prior Functioning/Environment              Frequency  Min 2X/week        Progress Toward Goals  OT Goals(current goals can now be found in the care plan section)  Progress towards OT goals: Progressing toward goals  Acute Rehab OT Goals Patient Stated Goal: get better OT Goal Formulation: With patient Time For Goal Achievement: 07/19/22 Potential to Achieve Goals: Good ADL Goals Pt Will Perform Grooming: with supervision;sitting Pt Will Perform Upper Body Bathing: with supervision;sitting Pt Will Perform Lower Body Bathing: with mod assist;sitting/lateral leans Pt Will Perform Upper Body Dressing: with supervision;sitting Pt Will Perform Lower Body Dressing: with min assist;sitting/lateral leans Pt Will Transfer to Toilet: with mod assist Pt Will Perform Toileting - Clothing Manipulation and hygiene: with mod assist;sitting/lateral leans Additional ADL Goal #1: pt will complete bed mobility with supervision in prep for ADLs  Plan Discharge plan remains appropriate    Co-evaluation                 AM-PAC OT "6 Clicks" Daily Activity     Outcome Measure   Help from another person eating meals?:  None Help from another person taking care of personal grooming?: A Little Help from another person toileting, which includes using toliet, bedpan, or urinal?: Total Help from another person bathing (including washing, rinsing, drying)?: A Lot Help from another person to put on and taking off regular upper body clothing?: A Little Help from another person to put on and taking off regular lower body clothing?: Total 6 Click Score: 14    End of Session    OT Visit Diagnosis: Unsteadiness on feet (R26.81);Other abnormalities of gait and mobility (R26.89);Muscle weakness (generalized) (M62.81)   Activity Tolerance Patient tolerated treatment well   Patient Left in bed;with call bell/phone within reach;with nursing/sitter in room   Nurse Communication Mobility status        Time: 8250-5397 OT Time Calculation (min): 28 min  Charges: OT General Charges $OT Visit: 1 Visit OT Treatments $Self Care/Home Management : 8-22 mins $Therapeutic Activity: 8-22 mins  Lodema Hong, OTA Acute Rehabilitation Services  Office [phone]   Trixie Dredge 07/09/2022, 2:59 PM

## 2022-07-09 NOTE — Plan of Care (Signed)
  Problem: Health Behavior/Discharge Planning: Goal: Ability to manage health-related needs will improve Outcome: Progressing   Problem: Metabolic: Goal: Ability to maintain appropriate glucose levels will improve Outcome: Progressing   Problem: Nutritional: Goal: Maintenance of adequate nutrition will improve Outcome: Progressing

## 2022-07-09 NOTE — TOC Initial Note (Signed)
Transition of Care [facility]) - Initial/Assessment Note    Patient Details  Name: [name] MRN: [id number] Date of Birth: 05-Aug-1956  Transition of Care [facility]) CM/SW Contact:    Joanne Chars, LCSW Phone Number: 07/09/2022, 4:16 PM  Clinical Narrative:   CSW met with pt regarding DC recommendation for SNF.  Pt is agreeable to this, permission given to send out referral in hub, permission given to speak with wife [name].  Pt lives at home with wife and with adult son, who is disabled.  Pt has [facility] aide who comes twice per week.  Pt is vaccinated for covid with multiple boosters.  HD pt.                  Expected Discharge Plan: Skilled Nursing Facility Barriers to Discharge: Continued Medical Work up, SNF Pending bed offer   Patient Goals and CMS Choice Patient states their goals for this hospitalization and ongoing recovery are:: walk again      Expected Discharge Plan and Services Expected Discharge Plan: [place] In-house Referral: Clinical Social Work   Post Acute Care Choice: [place] Living arrangements for the past 2 months: [place]                                      Prior Living Arrangements/Services Living arrangements for the past 2 months: Single Family Home Lives with:: Spouse, Adult Children (one son, who is disabled) Patient language and need for interpreter reviewed:: Yes Do you feel safe going back to the place where you live?: Yes      Need for Family Participation in Patient Care: Yes (Comment) Care giver support system in place?: Yes (comment) Current home services: Homehealth aide (2x week) Criminal Activity/Legal Involvement Pertinent to Current Situation/Hospitalization: No - Comment as needed  Activities of Daily Living      Permission Sought/Granted Permission sought to share information with : Family Supports Permission granted to share information with : Yes, Verbal Permission  Granted  Share Information with NAME: wife [name]  Permission granted to share info w AGENCY: SNF        Emotional Assessment Appearance:: Appears stated age Attitude/Demeanor/Rapport: Engaged Affect (typically observed): Appropriate, Pleasant Orientation: : Oriented to Self, Oriented to Place, Oriented to  Time, Oriented to Situation      Admission diagnosis:  Chest wall pain [R07.89] Osteomyelitis ([place]) [M86.9] Left leg pain [M79.605] S/P BKA (below knee amputation) unilateral, left ([place]) [J49.702] Patient Active Problem List   Diagnosis Date Noted   Chest wall pain 07/04/2022   S/P BKA (below knee amputation) unilateral, left ([place]burg) 07/04/2022   Subacute osteomyelitis, left ankle and foot (Lansing) 07/03/2022   Atrial fibrillation, chronic ([place]ville) 01/13/2022   Physical deconditioning 01/13/2022   Anemia of renal disease 01/13/2022   Obesity (BMI 30-39.9) 01/13/2022   Decubitus ulcer of buttock 01/13/2022   Intractable nausea and vomiting with epigastric pain 01/11/2022   Chronic osteomyelitis ([place]) 01/11/2022   Proliferative diabetic retinopathy of left eye with macular edema associated with type 2 diabetes mellitus ([place]) 11/27/2021   Vitreous hemorrhage of right eye ([place] Rolls) 63/78/5885   Acute metabolic encephalopathy 02/77/4128   COVID 09/02/2021   Sepsis ([place]) 09/02/2021   Severe sepsis with lactic acidosis ([place]arnell) 06/14/2021   Infection associated with implanted penile prosthesis (Revloc) 06/14/2021   ESRD on hemodialysis (San Pablo) 06/14/2021   Paroxysmal atrial fibrillation with  RVR ([place] Hope) 06/14/2021   Carotid stenosis 06/15/2020   Dyslipidemia 06/15/2020   Diabetic macular edema of right eye with proliferative retinopathy associated with type 2 diabetes mellitus (Windsor) 01/18/2020   Severe nonproliferative diabetic retinopathy of left eye, with macular edema, associated with type 2 diabetes mellitus (Royalton) 01/04/2020   Retinal hemorrhage of right eye 01/04/2020   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   Pseudoclaudication 11/15/2018   Essential hypertension 09/18/2018   S/P BKA (below knee amputation), right (Williams) 09/18/2018   Amputation of toe of left foot (Chain Lake) 09/18/2018   Peripheral artery disease (Danville) 09/18/2018   S/P carotid endarterectomy 09/18/2018   OSA not tolerating CPAP 09/18/2018   Chronic back pain 09/18/2018   Status post reversal of ileostomy 05/21/2018   Normocytic anemia 02/14/2018   Intra-abdominal abscess (Laurel) 11/04/2017   Chronic venous hypertension (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 (Sidney) 12/11/2016   PAD (peripheral artery disease) (Austintown) 11/12/2016   Unilateral primary osteoarthritis, left knee 10/11/2016   History of right below knee amputation (Beattie) 07/12/2016   Infected diabetic left foot ulcer with chronic osteomyelitis    Diabetic foot infection (Dillwyn) 05/04/2016   Insulin dependent type 2 diabetes mellitus (Welch) 12/16/2015   Amputated toe ([place]intown) 10/21/2015   Cellulitis of left lower extremity 09/03/2015   Leg edema, left 09/03/2015   CAD (dz of distal, mid and proximal RCA with implantation of 3 overlapping drug-eluting stent,) 09/03/2015   Chronic diastolic heart failure, NYHA class 2 (Manawa) 09/03/2015   Angina pectoris associated with type 2 diabetes mellitus (Palmer) 10/28/2014   Hyperlipidemia 08/25/2014   Limb pain 03/20/2013   DJD (degenerative joint disease) 09/25/2012   Migraine 09/25/2012   Neuropathy 09/25/2012   Restless legs syndrome (RLS) 09/25/2012   Chronic obstructive pulmonary disease, unspecified (Ridgeville) 04/25/2012   Unknown cause of morbidity or mortality 04/25/2012   Chronic total occlusion of artery of the extremities (Marueno) 04/08/2012   Onychomycosis 02/01/2012   Occlusion and stenosis of carotid artery without mention of cerebral infarction 06/12/2011   GERD (gastroesophageal reflux disease) 05/08/2011   Barrett's esophagus without dysplasia 05/08/2011   Former tobacco use 02/01/2011    PCP:  [name], PA-C Pharmacy:   [facility] [place], [place] - [address] AT [place] [address] [place] [address] Phone: [phone] Fax: [phone]     Social Determinants of Health (SDOH) Interventions    Readmission Risk Interventions    06/30/2021   11:53 AM  Readmission Risk Prevention Plan  Transportation Screening Complete  Medication Review (RN Care Manager) Complete  HRI or [place] Complete  SW Recovery Care/Counseling Consult Complete  Palliative Care Screening Not [place] Not Applicable

## 2022-07-09 NOTE — NC FL2 (Signed)
[place] MEDICAID FL2 LEVEL OF CARE SCREENING TOOL     IDENTIFICATION  Patient Name: [name] Birthdate: Jan 05, 1956 Sex: male Admission Date (Current Location): 07/03/2022  [facility] and [place] Number:  Herbalist and Address:  [facility] [place], [place] [address] [place], [place] [address]      Provider Number: [id number]  Attending Physician Name and Address:  Newt Minion, MD  Relative Name and Phone Number:  Ezzard, Ditmer Spouse  [phone]    Current Level of Care: Hospital Recommended Level of Care: [place] Prior Approval Number:    Date Approved/Denied:   PASRR Number: [id number]  Discharge Plan: SNF    Current Diagnoses: Patient Active Problem List   Diagnosis Date Noted   Chest wall pain 07/04/2022   S/P BKA (below knee amputation) unilateral, left (Pine Ridge) 07/04/2022   Subacute osteomyelitis, left ankle and foot ([place]leasant Hill) 07/03/2022   Atrial fibrillation, chronic (Buckholts) 01/13/2022   Physical deconditioning 01/13/2022   Anemia of renal disease 01/13/2022   Obesity (BMI 30-39.9) 01/13/2022   Decubitus ulcer of buttock 01/13/2022   Intractable nausea and vomiting with epigastric pain 01/11/2022   Chronic osteomyelitis (Billings) 01/11/2022   Proliferative diabetic retinopathy of left eye with macular edema associated with type 2 diabetes mellitus (White Plains) 11/27/2021   Vitreous hemorrhage of right eye (Melvern) 24/40/1027   Acute metabolic encephalopathy 25/36/6440   COVID 09/02/2021   Sepsis (Greybull) 09/02/2021   Severe sepsis with lactic acidosis ([place]. [place]ena) 06/14/2021   Infection associated with implanted penile prosthesis ([place] Har[place] Beach) 06/14/2021   ESRD on hemodialysis ([place] Crossroads) 06/14/2021   Paroxysmal atrial fibrillation with RVR (Goodrich) 06/14/2021   Carotid stenosis 06/15/2020   Dyslipidemia 06/15/2020   Diabetic macular edema of right eye with proliferative retinopathy associated with type 2 diabetes mellitus (Ellinwood)  01/18/2020   Severe nonproliferative diabetic retinopathy of left eye, with macular edema, associated with type 2 diabetes mellitus (Munford) 01/04/2020   Retinal hemorrhage of right eye 01/04/2020   Trifascicular block 11/15/2018   Pseudoclaudication 11/15/2018   Essential hypertension 09/18/2018   S/P BKA (below knee amputation), right (Kenmore) 09/18/2018   Amputation of toe of left foot (Dahlonega) 09/18/2018   Peripheral artery disease (Pineview) 09/18/2018   S/P carotid endarterectomy 09/18/2018   OSA not tolerating CPAP 09/18/2018   Chronic back pain 09/18/2018   Status post reversal of ileostomy 05/21/2018   Normocytic anemia 02/14/2018   Intra-abdominal abscess (Conneaut) 11/04/2017   Chronic venous hypertension (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 (Elgin) 12/11/2016   PAD (peripheral artery disease) (Morris) 11/12/2016   Unilateral primary osteoarthritis, left knee 10/11/2016   History of right below knee amputation (Cove) 07/12/2016   Infected diabetic left foot ulcer with chronic osteomyelitis    Diabetic foot infection (Whittingham) 05/04/2016   Insulin dependent type 2 diabetes mellitus (Velda Village Hills) 12/16/2015   Amputated toe (Milltown) 10/21/2015   Cellulitis of left lower extremity 09/03/2015   Leg edema, left 09/03/2015   CAD (dz of distal, mid and proximal RCA with implantation of 3 overlapping drug-eluting stent,) 09/03/2015   Chronic diastolic heart failure, NYHA class 2 (Krakow) 09/03/2015   Angina pectoris associated with type 2 diabetes mellitus (Garza-Salinas II) 10/28/2014   Hyperlipidemia 08/25/2014   Limb pain 03/20/2013   DJD (degenerative joint disease) 09/25/2012   Migraine 09/25/2012   Neuropathy 09/25/2012   Restless legs syndrome (RLS) 09/25/2012   Chronic obstructive pulmonary disease, unspecified (Fairfax) 04/25/2012   Unknown cause of morbidity or  mortality 04/25/2012   Chronic total occlusion of artery of the extremities (Hobart) 04/08/2012   Onychomycosis 02/01/2012   Occlusion and  stenosis of carotid artery without mention of cerebral infarction 06/12/2011   GERD (gastroesophageal reflux disease) 05/08/2011   Barrett's esophagus without dysplasia 05/08/2011   Former tobacco use 02/01/2011    Orientation RESPIRATION BLADDER Height & Weight     Self, Time, Situation, Place  Normal Continent Weight: 274 lb 11.1 oz (124.6 kg) Height:     BEHAVIORAL SYMPTOMS/MOOD NEUROLOGICAL BOWEL NUTRITION STATUS      Continent Diet (see discharge summary)  AMBULATORY STATUS COMMUNICATION OF NEEDS Skin   Total Care Verbally Surgical wounds                       Personal Care Assistance Level of Assistance  Bathing, Feeding, Dressing Bathing Assistance: Maximum assistance Feeding assistance: Limited assistance Dressing Assistance: Maximum assistance     Functional Limitations Info  Sight, Hearing, Speech Sight Info: Adequate Hearing Info: Adequate Speech Info: Adequate    SPECIAL CARE FACTORS FREQUENCY  PT (By licensed PT), OT (By licensed OT)     PT Frequency: 5x week OT Frequency: 5x week            Contractures Contractures Info: Not present    Additional Factors Info  Code Status, Allergies, Insulin Sliding Scale Code Status Info: full Allergies Info: Contrast Media (Iodinated Contrast Media), Ivp Dye (Iodinated Contrast Media), Adhesive (Tape), Latex   Insulin Sliding Scale Info: Novolog: see discharge summary       Current Medications (07/09/2022):  This is the current hospital active medication list Current Facility-Administered Medications  Medication Dose Route Frequency Provider Last Rate Last Admin   acetaminophen (TYLENOL) tablet 325-650 mg  325-650 mg Oral Q6H PRN Newt Minion, MD   650 mg at 07/08/22 1062   ALPRAZolam Duanne Moron) tablet 0.5 mg  0.5 mg Oral TID PRN Newt Minion, MD   0.5 mg at 07/09/22 1054   amiodarone (PACERONE) tablet 100 mg  100 mg Oral Daily Newt Minion, MD   100 mg at 07/09/22 1054   amLODipine (NORVASC) tablet 5  mg  5 mg Oral Daily PRN Newt Minion, MD       apixaban Arne Cleveland) tablet 5 mg  5 mg Oral BID Newt Minion, MD   5 mg at 07/09/22 1054   atorvastatin (LIPITOR) tablet 40 mg  40 mg Oral QHS Newt Minion, MD   40 mg at 07/08/22 2158   bisacodyl (DULCOLAX) EC tablet 5 mg  5 mg Oral Daily PRN Newt Minion, MD       ceFAZolin (ANCEF) IVPB 2g/100 mL premix  2 g Intravenous Q T,Th,Sa-HD Hammons, Kimberly B, RPH 200 mL/hr at 07/08/22 1335 2 g at 07/08/22 1335   Chlorhexidine Gluconate Cloth 2 % PADS 6 each  6 each Topical Q0600 Claudia Desanctis, MD   6 each at 07/07/22 0437   Darbepoetin Alfa (ARANESP) injection 100 mcg  100 mcg Subcutaneous Q Sat-1800 Newt Minion, MD   100 mcg at 07/08/22 0040   docusate sodium (COLACE) capsule 100 mg  100 mg Oral Daily Newt Minion, MD   100 mg at 07/09/22 1054   doxercalciferol (HECTOROL) capsule 4 mcg  4 mcg Oral Q T,Th,Sa-HD Claudia Desanctis, MD       fentaNYL (DURAGESIC) 50 MCG/HR 1 patch  1 patch Transdermal Q72H Newt Minion, MD  1 patch at 07/09/22 1436   gentamicin (GARAMYCIN) IVPB 100 mg  100 mg Intravenous Q T,Th,Sa-HD Hammons, Kimberly B, RPH 200 mL/hr at 07/08/22 1411 100 mg at 07/08/22 1411   guaiFENesin-dextromethorphan (ROBITUSSIN DM) 100-10 MG/5ML syrup 15 mL  15 mL Oral Q4H PRN Newt Minion, MD       hydrALAZINE (APRESOLINE) injection 5 mg  5 mg Intravenous Q20 Min PRN Newt Minion, MD   5 mg at 07/04/22 0915   HYDROmorphone (DILAUDID) injection 0.5-1 mg  0.5-1 mg Intravenous Q4H PRN Newt Minion, MD   1 mg at 07/06/22 0640   insulin aspart (novoLOG) injection 0-6 Units  0-6 Units Subcutaneous TID WC Newt Minion, MD   1 Units at 07/09/22 1409   insulin aspart (novoLOG) injection 3 Units  3 Units Subcutaneous TID WC Newt Minion, MD   3 Units at 07/09/22 1408   insulin glargine-yfgn (SEMGLEE) injection 35 Units  35 Units Subcutaneous QHS Newt Minion, MD       labetalol (NORMODYNE) injection 10 mg  10 mg Intravenous Q10 min PRN  Newt Minion, MD       metoprolol tartrate (LOPRESSOR) injection 2-5 mg  2-5 mg Intravenous Q2H PRN Newt Minion, MD       midodrine (PROAMATINE) tablet 10 mg  10 mg Oral Q T,Th,Sa-HD Newt Minion, MD   10 mg at 07/07/22 1219   nutrition supplement (JUVEN) (JUVEN) powder packet 1 packet  1 packet Oral BID BM Newt Minion, MD   1 packet at 07/09/22 1408   ondansetron (ZOFRAN) injection 4 mg  4 mg Intravenous Q6H PRN Newt Minion, MD       oxyCODONE (Oxy IR/ROXICODONE) immediate release tablet 10-15 mg  10-15 mg Oral Q4H PRN Newt Minion, MD   10 mg at 07/09/22 1054   oxyCODONE (Oxy IR/ROXICODONE) immediate release tablet 5-10 mg  5-10 mg Oral Q4H PRN Newt Minion, MD   5 mg at 07/08/22 2158   pantoprazole (PROTONIX) EC tablet 40 mg  40 mg Oral Daily Newt Minion, MD   40 mg at 07/09/22 1054   phenol (CHLORASEPTIC) mouth spray 1 spray  1 spray Mouth/Throat PRN Newt Minion, MD       polyethylene glycol (MIRALAX / GLYCOLAX) packet 17 g  17 g Oral Daily PRN Newt Minion, MD       polyvinyl alcohol (LIQUIFILM TEARS) 1.4 % ophthalmic solution 1 drop  1 drop Both Eyes BID PRN Newt Minion, MD       pregabalin (LYRICA) capsule 100 mg  100 mg Oral BID Newt Minion, MD   100 mg at 07/09/22 1054   rOPINIRole (REQUIP) tablet 2 mg  2 mg Oral QHS Newt Minion, MD   2 mg at 07/08/22 2158   tamsulosin (FLOMAX) capsule 0.4 mg  0.4 mg Oral Daily Newt Minion, MD   0.4 mg at 07/09/22 1054   zinc sulfate capsule 220 mg  220 mg Oral Daily Newt Minion, MD   220 mg at 07/09/22 1054     Discharge Medications: Please see discharge summary for a list of discharge medications.  Relevant Imaging Results:  Relevant Lab Results:   Additional Information [phone].   HD patient  Joanne Chars, LCSW

## 2022-07-09 NOTE — Care Management Important Message (Signed)
Important Message  Patient Details  Name: [name] MRN: [id number] Date of Birth: [birth year]   Medicare Important Message Given:  Yes     Hannah Beat 07/09/2022, 11:58 AM

## 2022-07-10 DIAGNOSIS — Z992 Dependence on renal dialysis: Secondary | ICD-10-CM | POA: Diagnosis not present

## 2022-07-10 DIAGNOSIS — E1122 Type 2 diabetes mellitus with diabetic chronic kidney disease: Secondary | ICD-10-CM | POA: Diagnosis not present

## 2022-07-10 DIAGNOSIS — N186 End stage renal disease: Secondary | ICD-10-CM | POA: Diagnosis not present

## 2022-07-10 LAB — GLUCOSE, CAPILLARY
Glucose-Capillary: 162 mg/dL — ABNORMAL HIGH (ref 70–99)
Glucose-Capillary: 189 mg/dL — ABNORMAL HIGH (ref 70–99)
Glucose-Capillary: 203 mg/dL — ABNORMAL HIGH (ref 70–99)
Glucose-Capillary: 175 mg/dL — ABNORMAL HIGH (ref 70–99)
Glucose-Capillary: 168 mg/dL — ABNORMAL HIGH (ref 70–99)

## 2022-07-10 MED ORDER — HYDROMORPHONE HCL 1 MG/ML IJ SOLN
INTRAMUSCULAR | Status: AC
  Filled 2022-07-10: qty 0.5

## 2022-07-10 MED ORDER — TORSEMIDE 20 MG PO TABS
80.0000 mg | ORAL_TABLET | ORAL | Status: DC
  Administered 2022-07-11 – 2022-07-16 (×4): 80 mg via ORAL
  Filled 2022-07-10 (×4): qty 4

## 2022-07-10 MED ORDER — HYDROMORPHONE HCL 1 MG/ML IJ SOLN
0.5000 mg | Freq: Once | INTRAMUSCULAR | Status: AC | PRN
  Administered 2022-07-10: 0.5 mg via INTRAVENOUS

## 2022-07-10 MED ORDER — FENTANYL 50 MCG/HR TD PT72: 1.0000 | MEDICATED_PATCH | TRANSDERMAL | 0 refills | Status: DC

## 2022-07-10 MED ORDER — OXYCODONE-ACETAMINOPHEN 7.5-325 MG PO TABS: 1.0000 | ORAL_TABLET | ORAL | 0 refills | Status: DC | PRN

## 2022-07-10 NOTE — Plan of Care (Signed)
  Problem: Health Behavior/Discharge Planning: Goal: Ability to manage health-related needs will improve Outcome: Progressing   Problem: Metabolic: Goal: Ability to maintain appropriate glucose levels will improve Outcome: Progressing   Problem: Nutritional: Goal: Maintenance of adequate nutrition will improve Outcome: Progressing   Problem: Skin Integrity: Goal: Risk for impaired skin integrity will decrease Outcome: Progressing

## 2022-07-10 NOTE — Progress Notes (Signed)
Pt receives out-pt HD at [facility] on TTS. Pt arrives at 11:15 for 11:35 chair time. Will assist as needed.   Melven Sartorius Renal Navigator [phone]

## 2022-07-10 NOTE — Progress Notes (Signed)
Patient ID: [name], male   DOB: [birth year], 66 y.o.   MRN: [id number] Patient comfortable this morning without complaints.  There is no drainage in the wound VAC canister.  Patient and his wife have decided to proceed with skilled nursing placement.  Will discharge when bed available.

## 2022-07-10 NOTE — Consult Note (Signed)
[facility] CM Inpatient Consult   07/10/2022  LOGON UTTECH March 28, 1956 [id number]  [place] [facility] [ACO] Patient: [facility]   Primary Care Provider:  [name], [facility]  Note: If the patient goes to a [facility] affiliated facility then, patient can be followed by [facility] RN with traditional Medicare and approved Medicare Advantage plans.   12:52 pm Met with the patient at the bedside regarding post hospital transition.  Patient endorses PCP. Patient states, "My wife and I come to an agreement on where I should go because what she says goes.  She oversees what is needed. Explained to patient that the inpatient team will give the bed offers and if the facility is in the [facility] affiliation then a [facility] RN can follow up for transitioning back home care coordination and needs.  Patient verbalized understanding but states, "my wife will be the best person for talking to about decisions."  Assisted patient with bedside table to better reach it.  Patient's electronic medical record reviewed with inpatient [facility] team notes and PT/OT.   Plan:  Patient with transition orders noted. Continue to follow and then notify [facility] RN who can follow for any known or needs for transitional care needs for returning to post facility care or complex disease management.  For questions or referrals, please contact:   Natividad Brood, RN BSN [place]  [phone] business mobile phone Toll free office [phone]  *Rachel  [phone] Fax number: [phone] [email or web address]

## 2022-07-10 NOTE — Progress Notes (Addendum)
[facility] Progress Note   Subjective: Seen in room. Planning to DC to SNF. HD later today. Started Torsemide on non HD days to offset high IDWG.     Objective Vitals:   07/09/22 0721 07/09/22 1423 07/09/22 2133 07/10/22 0742  BP: (!) 124/56 (!) 115/55 (!) 103/55 (!) 115/58  Pulse: 66 68 68 73  Resp: '18 19 18 17  '$ Temp: 98.8 F (37.1 C) 99 F (37.2 C) 99.2 F (37.3 C) 98.6 F (37 C)  TempSrc: Oral Oral Oral   SpO2: 100% 97% 97%   Weight:       Physical Exam General: Chronically ill appearing male in NAD Heart: S1,S2 RRR No M/R/G Lungs: CTAB A/P Abdomen: Obese, NABS. Condom cath in place, clear yellow urine.  Extremities: L BKA in stump protector. Wound vac in place. R BKA with prosthetic sleeve in place.  Dialysis Access: L AVF + T/B   Additional Objective Labs: Basic Metabolic Panel: Recent Labs  Lab 07/04/22 1146 07/04/22 1852 07/05/22 0352 07/06/22 0501 07/09/22 0315  NA 134*  --  131* 135 134*  K 4.6  --  4.9 3.8 4.2  CL 100  --  95* 94* 93*  CO2 21*  --  21* 27 26  GLUCOSE 274*   < > 344* 238* 172*  BUN 49*  --  56* 31* 43*  CREATININE 8.63*  --  8.99* 6.25* 6.57*  CALCIUM 6.9*  --  6.8* 7.2* 7.7*  PHOS 6.6*  --   --   --  6.4*   < > = values in this interval not displayed.   Liver Function Tests: Recent Labs  Lab 07/03/22 1255 07/04/22 1146 07/09/22 0315  AST 13*  --   --   ALT 6  --   --   ALKPHOS 122  --   --   BILITOT 0.4  --   --   PROT 5.9*  --   --   ALBUMIN 2.9* 2.7* 2.3*   No results for input(s): "LIPASE", "AMYLASE" in the last 168 hours. CBC: Recent Labs  Lab 07/03/22 1255 07/04/22 1146 07/05/22 0352 07/06/22 0501  WBC 6.2 8.9 8.4 8.3  HGB 9.9* 9.7* 8.4* 8.8*  HCT 28.9* 27.7* 24.6* 25.7*  MCV 92.3 90.5 91.4 91.1  PLT 213 219 229 205   Blood Culture    Component Value Date/Time   SDES BLOOD RIGHT FOREARM 06/25/2022 1258   SPECREQUEST  06/25/2022 1258    BOTTLES DRAWN AEROBIC AND ANAEROBIC Blood Culture  adequate volume   CULT  06/25/2022 1258    NO GROWTH 5 DAYS Performed at [facility], [place] [address], [place], [place] [address]    REPTSTATUS 06/30/2022 FINAL 06/25/2022 1258    Cardiac Enzymes: No results for input(s): "CKTOTAL", "CKMB", "CKMBINDEX", "TROPONINI" in the last 168 hours. CBG: Recent Labs  Lab 07/09/22 0725 07/09/22 1359 07/09/22 1608 07/09/22 2013 07/10/22 0738  GLUCAP 151* 171* 127* 145* 189*   Iron Studies: No results for input(s): "IRON", "TIBC", "TRANSFERRIN", "FERRITIN" in the last 72 hours. '@[email or web address]'$ @ Studies/Results: No results found. Medications:   ceFAZolin (ANCEF) IV 2 g (07/08/22 1335)   gentamicin 100 mg (07/08/22 1411)    amiodarone  100 mg Oral Daily   apixaban  5 mg Oral BID   atorvastatin  40 mg Oral QHS   Chlorhexidine Gluconate Cloth  6 each Topical Q0600   darbepoetin (ARANESP) injection - DIALYSIS  100 mcg Subcutaneous Q Sat-1800   docusate sodium  100 mg Oral Daily   doxercalciferol  4 mcg Oral Q T,Th,Sa-HD   fentaNYL  1 patch Transdermal Q72H   insulin aspart  0-6 Units Subcutaneous TID WC   insulin aspart  3 Units Subcutaneous TID WC   insulin glargine-yfgn  35 Units Subcutaneous QHS   midodrine  10 mg Oral Q T,Th,Sa-HD   nutrition supplement (JUVEN)  1 packet Oral BID BM   pantoprazole  40 mg Oral Daily   pregabalin  100 mg Oral BID   rOPINIRole  2 mg Oral QHS   tamsulosin  0.4 mg Oral Daily   zinc sulfate  220 mg Oral Daily     Assessment/Plan:    # Osteomyelitis left foot - s/p left BKA on 82/95/62   # PAD  - complicates wound healing; note BKA as above - I have encouraged him to speak with his team about his right leg prosthesis not fitting well    # ESRD  - HD per TTS schedule. HD today on schedule.    # Volume - note he will have a lower EDW s/p the left BKA; currently still 12.6 kg above OP EDW. Not sure is weights are being done with or without brace. UF and lower volume as tolerated. Issues with  high gains. He makes urine. Will add Torsemide on non HD days to attempt to offset high IDWG.    # HTN - Acceptable. On Midodrine.    # Anemia CKD  - Note ESA is due 07/07/22 per outpatient orders.  Started on aranesp 100 mcg weekly on Saturdays while here   # Metabolic bone disease  - continue hectorol (to be given with HD - please do not list on outpatient med list)    Disposition per primary team.  Looking for SNF per charting  Taytum Scheck H. Lichelle Viets NP-C 07/10/2022, 10:45 AM  [employer] [phone]

## 2022-07-10 NOTE — TOC Progression Note (Signed)
Transition of Care [facility]) - Progression Note    Patient Details  Name: [name] MRN: [id number] Date of Birth: Dec 19, 1955  Transition of Care [facility]) CM/SW Contact  Joanne Chars, LCSW Phone Number: 07/10/2022, 10:32 AM  Clinical Narrative:  Bed offers presented to pt.  He is requesting responses from other options.  CSW reached out to [facility] and [facility] asking them to review referral.   1500: [facility] does offer a bed.  [facility] are not able to offer beds.  Expected Discharge Plan: [place] Barriers to Discharge: Continued Medical Work up, SNF Pending bed offer  Expected Discharge Plan and Services Expected Discharge Plan: [place] In-house Referral: Clinical Social Work   Post Acute Care Choice: [place] Living arrangements for the past 2 months: [place] Expected Discharge Date: 07/10/22                                     Social Determinants of Health (SDOH) Interventions    Readmission Risk Interventions    06/30/2021   11:53 AM  Readmission Risk Prevention Plan  Transportation Screening Complete  Medication Review Press photographer) Complete  HRI or [place] Complete  SW Recovery Care/Counseling Consult Complete  [place] Not Applicable

## 2022-07-10 NOTE — Progress Notes (Addendum)
Physical Therapy Treatment Patient Details Name: [name] MRN: [id number] DOB: 02/24/1956 Today's Date: 07/10/2022   History of Present Illness 66 yo male Admitted 10/24 for  dx sepsis with lactic acidosis. 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 S/p Lt BKA 10/25.    PT Comments    Pt received in supine, agreeable to therapy session and with good participation and tolerance for supine BLE exercises for ROM/strengthening. Pt with difficulty achieving long sitting in bed using B side rails and maxA to achieve upright, pt with frequent BUE tremor-like shaking and unable to maintain seated posture without max to totalA. RN notified of pt tremors. Defer EOB/OOB for pt safety given pt fatigue and plan for HD this date, lift pad in room encouraged him to ask staff for lift OOB daily to chair, esp on non-HD days to build up tolerance for transfers/sitting. Frequency updated as per chart review disposition plan now for SNF, discussed with supervising PT Lorrin Goodell. Pt continues to benefit from PT services to progress toward functional mobility goals.    Recommendations for follow up therapy are one component of a multi-disciplinary discharge planning process, led by the attending physician.  Recommendations may be updated based on patient status, additional functional criteria and insurance authorization.  Follow Up Recommendations  Skilled nursing-short term rehab (<3 hours/day) Can patient physically be transported by private vehicle: No   Assistance Recommended at Discharge Frequent or constant Supervision/Assistance  Patient can return home with the following Help with stairs or ramp for entrance;Assist for transportation;Two people to help with walking and/or transfers;Two people to help with  bathing/dressing/bathroom;Assistance with cooking/housework;Direct supervision/assist for medications management   Equipment Recommendations  None recommended by PT (defer to post-acute; currently would need mechanical lift and hospital bed)    Recommendations for Other Services       Precautions / Restrictions Precautions Precautions: Fall;Knee Precaution Booklet Issued: Yes (comment) (HEP handout printed, reviewed importance of keeping leg straight and limb guard on at rest) Precaution Comments: Lt BKA precautions reviewed, Required Braces or Orthoses: Other Brace Other Brace: limb protector Restrictions Weight Bearing Restrictions: Yes LLE Weight Bearing: Non weight bearing     Mobility  Bed Mobility Overal bed mobility: Needs Assistance Bed Mobility: Supine to Sit     Supine to sit: HOB elevated, Max assist Sit to supine: Min assist   General bed mobility comments: increased time and assistance to raise trunk to long sitting in bed; pt unable to tolerate >5 seconds at a time. had pt perform x10 reps pulling up on bed rails pt unable to reach upright posture without maxA; frequent tremors on BUE causing difficulty with pt maintaining grasp of rails for seated balance so defer EOB for pt safety, RN notified.    Transfers                   General transfer comment: defer, pt plan to transport to HD dept soon, also with increased tremors today so unsafe to attempt with +1 assist    Ambulation/Gait               General Gait Details: Non-ambulatory at baseline.   Stairs             Wheelchair Mobility    Modified Rankin (Stroke Patients Only)  Balance Overall balance assessment: Needs assistance Sitting-balance support: Feet unsupported, Bilateral upper extremity supported Sitting balance-Leahy Scale: Poor Sitting balance - Comments: frequent BUE tremors and difficulty maintaining grasp of rails/bed for suppport, with posterior bias/LOB  in long sitting; defer EOB for pt safety Postural control: Posterior lean, Left lateral lean                                  Cognition Arousal/Alertness: Awake/alert Behavior During Therapy: WFL for tasks assessed/performed Overall Cognitive Status: No family/caregiver present to determine baseline cognitive functioning                                 General Comments: Slow processing; Frequent BUE and LE tremors at rest, pt with difficulty understanding LE HEP so handout printed with photos included to reinforce, pt still asking questions each rep about technique. RN notified of pt tremors/confusion and noted STM deficit. PTA unsure of pt's cognitive baseline, no family in room.        Exercises Amputee Exercises Quad Sets: AROM, Both, 20 reps, Supine Gluteal Sets: AROM, 10 reps, Supine Towel Squeeze: AROM, AAROM, Both, 10 reps, Supine Hip Extension:  (pt unable to understand in supine; plan to try in S/L next session) Knee Flexion: AROM, Both, 10 reps, Supine Knee Extension: AROM, Both, 10 reps, Supine Straight Leg Raises: AAROM, Left, 5 reps, Supine Chair Push Up:  (verbal/visual demo, pt too tired to try A/P scoot OOB today to perform) Other Exercises Other Exercises: supine BUE AROM: "bed crunches" x10 reps with emphasis on BUE and abdominal activation pulling forward with bed side rails    General Comments General comments (skin integrity, edema, etc.): ice placed on top of residual limb over knee and distal to stump, pt encouraged to leave it on up to 20 mins, NT notified to check/remove for him if he forgets.      Pertinent Vitals/Pain Pain Assessment Pain Assessment: 0-10 Faces Pain Scale: Hurts little more Pain Location: lower back (pt has hx of back surgeries and fusion) Pain Descriptors / Indicators: Guarding, Grimacing, Sore Pain Intervention(s): Limited activity within patient's tolerance, Monitored during session, Repositioned, Ice  applied, Other (comment) (pt adjusted higher up in bed and pillow to lower back for improved support; reviewed rolling q2H)     PT Goals (current goals can now be found in the care plan section) Acute Rehab PT Goals Patient Stated Goal: to get stronger and be able to get OOB to chair PT Goal Formulation: With patient Time For Goal Achievement: 07/20/22 Progress towards PT goals: Progressing toward goals    Frequency    Min 3X/week      PT Plan Current plan remains appropriate       AM-PAC PT "6 Clicks" Mobility   Outcome Measure  Help needed turning from your back to your side while in a flat bed without using bedrails?: A Lot Help needed moving from lying on your back to sitting on the side of a flat bed without using bedrails?: A Lot Help needed moving to and from a bed to a chair (including a wheelchair)?: Total Help needed standing up from a chair using your arms (e.g., wheelchair or bedside chair)?: Total Help needed to walk in hospital room?: Total Help needed climbing 3-5 steps with a railing? : Total 6 Click Score: 8    End of  Session Equipment Utilized During Treatment: Other (comment) (LLE limb guard) Activity Tolerance: Patient limited by fatigue;Patient limited by pain Patient left: in bed;with call bell/phone within reach;with bed alarm set;Other (comment) (pt with bed in chair posture, encouraged him to work on upright seated posture and rolling Q2H when not upright) Nurse Communication: Mobility status;Need for lift equipment;Precautions;Other (comment) (pt with frequent BUE tremory like activity and STM deficits) PT Visit Diagnosis: Muscle weakness (generalized) (M62.81);Difficulty in walking, not elsewhere classified (R26.2);Pain Pain - Right/Left: Left Pain - part of body: Leg (and lower back)     Time: 6195-0932 PT Time Calculation (min) (ACUTE ONLY): 30 min  Charges:  $Therapeutic Exercise: 23-37 mins                     Darlyn Repsher P., PTA Acute  Rehabilitation Services Secure Chat Preferred 9a-5:30pm Office: [phone]    Kara Pacer [facility] 07/10/2022, 12:59 PM

## 2022-07-10 NOTE — Progress Notes (Signed)
Received patient in bed to unit.  Alert and oriented.  Informed consent signed and in chart.   Treatment initiated: [place] Treatment completed: 1850  Patient tolerated well.  Transported back to the room  Alert, without acute distress.  Hand-off given to patient's nurse.   Access used: AVF Access issues: none  Total UF removed: 3.9L Medication(s) given: Cefazoline, Gentamicin Post HD VS: 123/76,92,16,98.0,98% Post HD weight: 117.5kg   [facility]

## 2022-07-11 LAB — GLUCOSE, CAPILLARY
Glucose-Capillary: 149 mg/dL — ABNORMAL HIGH (ref 70–99)
Glucose-Capillary: 202 mg/dL — ABNORMAL HIGH (ref 70–99)
Glucose-Capillary: 334 mg/dL — ABNORMAL HIGH (ref 70–99)
Glucose-Capillary: 366 mg/dL — ABNORMAL HIGH (ref 70–99)

## 2022-07-11 MED ORDER — OXYCODONE HCL 5 MG PO TABS
5.0000 mg | ORAL_TABLET | Freq: Three times a day (TID) | ORAL | Status: DC | PRN
  Administered 2022-07-12: 5 mg via ORAL
  Administered 2022-07-12 (×2): 10 mg via ORAL
  Administered 2022-07-14: 5 mg via ORAL
  Filled 2022-07-11 (×5): qty 2

## 2022-07-11 MED ORDER — OXYCODONE HCL 5 MG PO TABS
10.0000 mg | ORAL_TABLET | Freq: Three times a day (TID) | ORAL | Status: DC | PRN
  Administered 2022-07-13 – 2022-07-16 (×5): 10 mg via ORAL
  Filled 2022-07-11 (×2): qty 2
  Filled 2022-07-11: qty 3
  Filled 2022-07-11 (×2): qty 2

## 2022-07-11 NOTE — Progress Notes (Addendum)
[facility] Progress Note   Subjective: Seen in room. Accusing [place] of "pulling too much fluid off" and denies that he asked for more volume to be removed 2022/07/19. Says "I almost died last night" but can't give specific symptoms or events that happened last night. Nothing about adverse events documented in notes. Says he has never seen me before in his life even though I've rounded for 3 days now.   No C/Os of pain. On RA with o2 sats 98%. Discharge to SNF in place.   Objective Vitals:   07/10/22 1850 07/10/22 2010 07/10/22 2300 07/11/22 0841  BP:  136/67 (!) 148/65 132/61  Pulse: 92 97 98 77  Resp:  '14 18 20  '$ Temp:  99.5 F (37.5 C) 99.3 F (37.4 C) 98.5 F (36.9 C)  TempSrc:  Oral Oral Oral  SpO2: 98% 100% 98% 100%  Weight:       Physical Exam General: Chronically ill appearing male in NAD Heart: S1,S2 RRR No M/R/G Lungs: CTAB A/P Abdomen: Obese, NABS. Condom cath in place, clear yellow urine.  Extremities: L BKA in stump protector. Wound vac in place. R BKA with prosthetic sleeve in place.  Dialysis Access: L AVF + T/B  Additional Objective Labs: Basic Metabolic Panel: Recent Labs  Lab 07/05/22 0352 07/06/22 0501 07-19-22 0315  NA 131* 135 134*  K 4.9 3.8 4.2  CL 95* 94* 93*  CO2 21* 27 26  GLUCOSE 344* 238* 172*  BUN 56* 31* 43*  CREATININE 8.99* 6.25* 6.57*  CALCIUM 6.8* 7.2* 7.7*  PHOS  --   --  6.4*   Liver Function Tests: Recent Labs  Lab 19-Jul-2022 0315  ALBUMIN 2.3*   No results for input(s): "LIPASE", "AMYLASE" in the last 168 hours. CBC: Recent Labs  Lab 07/05/22 0352 07/06/22 0501  WBC 8.4 8.3  HGB 8.4* 8.8*  HCT 24.6* 25.7*  MCV 91.4 91.1  PLT 229 205   Blood Culture    Component Value Date/Time   SDES BLOOD RIGHT FOREARM 06/25/2022 1258   SPECREQUEST  06/25/2022 1258    BOTTLES DRAWN AEROBIC AND ANAEROBIC Blood Culture adequate volume   CULT  06/25/2022 1258    NO GROWTH 5 DAYS Performed at [facility],  [place] [address] [place],  [address]    REPTSTATUS 06/30/2022 FINAL 06/25/2022 1258    Cardiac Enzymes: No results for input(s): "CKTOTAL", "CKMB", "CKMBINDEX", "TROPONINI" in the last 168 hours. CBG: Recent Labs  Lab 07/10/22 1135 07/10/22 2035 07/10/22 2255 07/11/22 0841 07/11/22 1142  GLUCAP 203* 175* 168* 149* 202*   Iron Studies: No results for input(s): "IRON", "TIBC", "TRANSFERRIN", "FERRITIN" in the last 72 hours. '@lablastinr3'$ @ Studies/Results: No results found. Medications:   ceFAZolin (ANCEF) IV Stopped (07/10/22 1905)   gentamicin Stopped (07/10/22 1908)    amiodarone  100 mg Oral Daily   apixaban  5 mg Oral BID   atorvastatin  40 mg Oral QHS   Chlorhexidine Gluconate Cloth  6 each Topical Q0600   darbepoetin (ARANESP) injection - DIALYSIS  100 mcg Subcutaneous Q Sat-1800   docusate sodium  100 mg Oral Daily   doxercalciferol  4 mcg Oral Q T,Th,Sa-HD   fentaNYL  1 patch Transdermal Q72H   insulin aspart  0-6 Units Subcutaneous TID WC   insulin aspart  3 Units Subcutaneous TID WC   insulin glargine-yfgn  35 Units Subcutaneous QHS   midodrine  10 mg Oral Q T,Th,Sa-HD   nutrition supplement (JUVEN)  1 packet  Oral BID BM   pantoprazole  40 mg Oral Daily   pregabalin  100 mg Oral BID   rOPINIRole  2 mg Oral QHS   tamsulosin  0.4 mg Oral Daily   torsemide  80 mg Oral Once per day on Sun Mon Wed Fri   zinc sulfate  220 mg Oral Daily   Outpatient HD orders:  [place]  TTS  4:15 450/800 112 kg 2.0 K/2.0 Ca AVF - No heparin  - mircera 100 mcg every 4 weeks (to be given on 10/28 - last outpatient  - hectorol 4 mcg every tx;  Last outpatient HD on 10/12     Assessment/Plan:    # Osteomyelitis left foot - s/p left BKA on 49/44/96   # PAD  - complicates wound healing; note BKA as above - I have encouraged him to speak with his team about his right leg prosthesis not fitting well    # ESRD  - HD per TTS schedule.Next HD 07/12/2022.    # Volume -  note he will have a lower EDW s/p the left BKA. HD 10/31 Net UF 3.9. Still 9.2 kg above OP EDW. Continue to attempt to lower volume as tolerated.  He makes urine. Will add Torsemide on non HD days to attempt to offset high IDWG.    # HTN - Acceptable. On Midodrine.    # Anemia CKD  - Note ESA is due 07/07/22 per outpatient orders.  Started on aranesp 100 mcg weekly on Saturdays while here   # Metabolic bone disease  - continue hectorol (to be given with HD - please do not list on outpatient med list)   # Possible delirium-monitor closely. Has been on multiple pain medications, is post anesthesia. Called Wife and discussed all parameters with wife and patient. They believe he has received too much pain medications, do not believe dry weight is correct. Will adjust pain medication frequency. Discussed EDW, and will attempt to lower EDW.    Disposition per primary team.  Discharge orders in place but no available SNF.   Myia Bergh H. Omid Deardorff NP-C 07/11/2022, 12:56 PM  [employer] [phone]

## 2022-07-11 NOTE — Plan of Care (Signed)
  Problem: Fluid Volume: Goal: Hemodynamic stability will improve Outcome: Progressing   Problem: Clinical Measurements: Goal: Diagnostic test results will improve Outcome: Progressing Goal: Signs and symptoms of infection will decrease Outcome: Progressing   Problem: Skin Integrity: Goal: Risk for impaired skin integrity will decrease Outcome: Progressing   Problem: Tissue Perfusion: Goal: Adequacy of tissue perfusion will improve Outcome: Progressing   Problem: Skin Integrity: Goal: Demonstration of wound healing without infection will improve Outcome: Progressing   Problem: Education: Goal: Knowledge of General Education information will improve Description: Including pain rating scale, medication(s)/side effects and non-pharmacologic comfort measures Outcome: Progressing

## 2022-07-11 NOTE — Progress Notes (Signed)
Occupational Therapy Treatment Patient Details Name: [name] MRN: [id number] DOB: Jun 03, 1956 Today's Date: 07/11/2022   History of present illness 66 yo male Admitted 10/24 for  dx sepsis with lactic acidosis. 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 S/p Lt BKA 10/25.   OT comments  Patient received in supine and agreeable to OT session. Patient was mod assist to get to EOB due to assistance to raise trunk. Patient was min assist for sitting balance once on EOB but progressed to min guard. Patient performed grooming seated on EOB and LB bathing to clean bottom. Patient performed lateral scooting on EOB with mod assist and cues for hand placement. Patient continues to make good gains and is motivated towards progress. Discharge recommendations changed to SNF due to currently looking at placement.    Recommendations for follow up therapy are one component of a multi-disciplinary discharge planning process, led by the attending physician.  Recommendations may be updated based on patient status, additional functional criteria and insurance authorization.    Follow Up Recommendations  Skilled nursing-short term rehab (<3 hours/day)    Assistance Recommended at Discharge Frequent or constant Supervision/Assistance  Patient can return home with the following  A little help with walking and/or transfers;A little help with bathing/dressing/bathroom;Assistance with cooking/housework;Direct supervision/assist for financial management;Direct supervision/assist for medications management;Assist for transportation;Help with stairs or ramp for entrance   Equipment Recommendations  Other (comment) (TBD)    Recommendations for Other Services      Precautions / Restrictions Precautions Precautions:  Fall;Knee Precaution Booklet Issued: No Required Braces or Orthoses: Other Brace Other Brace: limb protector Restrictions Weight Bearing Restrictions: Yes LLE Weight Bearing: Non weight bearing Other Position/Activity Restrictions: has RLE prosthetic       Mobility Bed Mobility Overal bed mobility: Needs Assistance Bed Mobility: Supine to Sit, Sit to Supine     Supine to sit: Mod assist, HOB elevated Sit to supine: Min assist   General bed mobility comments: increased time and assistnce to raise trunk and scoot hips forward to get to EOB    Transfers Overall transfer level: Needs assistance Equipment used: None              Lateral/Scoot Transfers: Mod assist General transfer comment: lateral supports to foot and head of bed     Balance Overall balance assessment: Needs assistance Sitting-balance support: Feet unsupported, Bilateral upper extremity supported, Single extremity supported Sitting balance-Leahy Scale: Poor (to fair) Sitting balance - Comments: min assist for sitting balance initially but progressed to min guard                                   ADL either performed or assessed with clinical judgement   ADL Overall ADL's : Needs assistance/impaired     Grooming: Wash/dry hands;Wash/dry face;Set up;Sitting Grooming Details (indicate cue type and reason): on EOB     Lower Body Bathing: Minimal assistance;Sitting/lateral leans Lower Body Bathing Details (indicate cue type and reason): lateral leaning to clean bottom seated on EOB                            Extremity/Trunk Assessment  Vision       Perception     Praxis      Cognition Arousal/Alertness: Awake/alert Behavior During Therapy: WFL for tasks assessed/performed Overall Cognitive Status: No family/caregiver present to determine baseline cognitive functioning                                 General Comments: slow processing,  occasionally requires cues repeated.        Exercises      Shoulder Instructions       General Comments      Pertinent Vitals/ Pain       Pain Assessment Pain Assessment: 0-10 Pain Score: 4  Pain Location: lower back and LLE Pain Descriptors / Indicators: Guarding, Grimacing, Sore Pain Intervention(s): Limited activity within patient's tolerance, Monitored during session, Repositioned, Premedicated before session  Home Living                                          Prior Functioning/Environment              Frequency  Min 2X/week        Progress Toward Goals  OT Goals(current goals can now be found in the care plan section)  Progress towards OT goals: Progressing toward goals  Acute Rehab OT Goals Patient Stated Goal: get better OT Goal Formulation: With patient Time For Goal Achievement: 07/19/22 Potential to Achieve Goals: Good ADL Goals Pt Will Perform Grooming: with supervision;sitting Pt Will Perform Upper Body Bathing: with supervision;sitting Pt Will Perform Lower Body Bathing: with mod assist;sitting/lateral leans Pt Will Perform Upper Body Dressing: with supervision;sitting Pt Will Perform Lower Body Dressing: with min assist;sitting/lateral leans Pt Will Transfer to Toilet: with mod assist Pt Will Perform Toileting - Clothing Manipulation and hygiene: with mod assist;sitting/lateral leans Additional ADL Goal #1: pt will complete bed mobility with supervision in prep for ADLs  Plan Discharge plan remains appropriate    Co-evaluation                 AM-PAC OT "6 Clicks" Daily Activity     Outcome Measure   Help from another person eating meals?: None Help from another person taking care of personal grooming?: A Little Help from another person toileting, which includes using toliet, bedpan, or urinal?: Total Help from another person bathing (including washing, rinsing, drying)?: A Lot Help from another person to put  on and taking off regular upper body clothing?: A Little Help from another person to put on and taking off regular lower body clothing?: Total 6 Click Score: 14    End of Session    OT Visit Diagnosis: Unsteadiness on feet (R26.81);Other abnormalities of gait and mobility (R26.89);Muscle weakness (generalized) (M62.81)   Activity Tolerance Patient tolerated treatment well   Patient Left in bed;with call bell/phone within reach;with bed alarm set   Nurse Communication Mobility status        Time: 0762-2633 OT Time Calculation (min): 26 min  Charges: OT General Charges $OT Visit: 1 Visit OT Treatments $Self Care/Home Management : 8-22 mins $Therapeutic Activity: 8-22 mins  Lodema Hong, [place]  Office [phone]    Trixie Dredge 07/11/2022, 3:09 PM

## 2022-07-11 NOTE — TOC Progression Note (Addendum)
Transition of Care [facility]) - Progression Note    Patient Details  Name: [name] MRN: [id number] Date of Birth: [birth year]  [facility]) CM/SW Contact  Joanne Chars, LCSW Phone Number: 07/11/2022, 10:37 AM  Clinical Narrative:   CSW spoke with pt about bed offers, gave new offer at [facility].  He asked CSW to speak with his wife.  CSW called wife [name], [place]: TC wife [name], discussed bed offers.  She does not like any of them. Discussed willingness to travel father out if SNF is better rated: she would be willing to go to [place], does not want [facility], does not want [facility].  She would really like [facility].  CSW will follow up with [facility] and [place].  TC [facility].  No male beds, none anticipated for the rest of the week.  CSW spoke with [facility] will review referral, no male beds currently, no current DC scheduled.CSW reached out to [employer].  1200: TC wife [name].  More discussion on having a SNF close by where they can check on pt frequently vs a higher rated SNF father away.  She asked CSW if pt could stay for 4-5 days until bed opens up at [facility] and [place] informed her that this is unlikely.  She wants pt close by, has children who are also wanting to be able to check on pt.  She will go to [facility] for visit tomorrow and call CSW afterwards.     Expected Discharge Plan: [place] Barriers to Discharge: Continued Medical Work up, SNF Pending bed offer  Expected Discharge Plan and Services Expected Discharge Plan: [place] In-house Referral: Clinical Social Work   Post Acute Care Choice: [place] Living arrangements for the past 2 months: [place] Expected Discharge Date: 07/10/22                                     Social Determinants of Health (SDOH) Interventions    Readmission Risk Interventions    06/30/2021    11:53 AM  Readmission Risk Prevention Plan  Transportation Screening Complete  Medication Review Press photographer) Complete  HRI or [place] Complete  SW Recovery Care/Counseling Consult Complete  [place] Not Applicable

## 2022-07-12 LAB — GLUCOSE, CAPILLARY
Glucose-Capillary: 193 mg/dL — ABNORMAL HIGH (ref 70–99)
Glucose-Capillary: 202 mg/dL — ABNORMAL HIGH (ref 70–99)
Glucose-Capillary: 263 mg/dL — ABNORMAL HIGH (ref 70–99)

## 2022-07-12 NOTE — Progress Notes (Signed)
Inpatient Rehabilitation Admissions Coordinator   Contacted by SW to inquire into CIR possibility. [employer] will not approve CIR admit for this diagnosis. Other rehab venues will need to be continued to be pursued.  Danne Baxter, RN, MSN Rehab Admissions Coordinator [phone] 07/12/2022 10:58 AM

## 2022-07-12 NOTE — Care Management Important Message (Signed)
Important Message  Patient Details  Name: [name] MRN: [id number] Date of Birth: 1955-09-24   Medicare Important Message Given:  Yes     Hannah Beat 07/12/2022, 12:26 PM

## 2022-07-12 NOTE — Plan of Care (Signed)
  Problem: Education: Goal: Knowledge of the prescribed therapeutic regimen will improve Outcome: Progressing   Problem: Activity: Goal: Ability to perform//tolerate increased activity and mobilize with assistive devices will improve Outcome: Progressing   Problem: Pain Management: Goal: Pain level will decrease with appropriate interventions Outcome: Progressing   Problem: Skin Integrity: Goal: Demonstration of wound healing without infection will improve Outcome: Progressing

## 2022-07-12 NOTE — TOC Progression Note (Addendum)
Transition of Care [facility]) - Progression Note    Patient Details  Name: JACARRI GESNER MRN: [id number] Date of Birth: [birth year]  Transition of Care [facility]) CM/SW Contact  Joanne Chars, LCSW Phone Number: 07/12/2022, 10:48 AM  Clinical Narrative:   CSW received voicemail from pt wife [name].  She does not want to accept offer at [facility].  "I want him at [place] [facility]) or I [facility] ([facility])"  CSW contacted Danne Baxter, CIR, who reports [facility] will not approve CIR for this dx.  Pt not a candidate for CIR.  1150: CSW LM with [facility] for any update on bed availability.  CSW LM with wife [name] with the above information and asking her to call so we can continue to work on finding acceptable option.   1245: TC [facility].  Since pt needs HD, they are unable to offer a bed, even if they did have one.  They are not accepting anyone with HD currently.    Expected Discharge Plan: [place] Barriers to Discharge: Continued Medical Work up, SNF Pending bed offer  Expected Discharge Plan and Services Expected Discharge Plan: [place] In-house Referral: Clinical Social Work   Post Acute Care Choice: [place] Living arrangements for the past 2 months: [place] Expected Discharge Date: 07/10/22                                     Social Determinants of Health (SDOH) Interventions    Readmission Risk Interventions    06/30/2021   11:53 AM  Readmission Risk Prevention Plan  Transportation Screening Complete  Medication Review Press photographer) Complete  HRI or [place] Complete  SW Recovery Care/Counseling Consult Complete  [place] Not Applicable

## 2022-07-12 NOTE — Progress Notes (Signed)
Patient ID: [name], male   DOB: [birth year], 66 y.o.   MRN: [id number] The patient is status post left below knee amputation on 07/04/22. Resting comfortably this morning. Vac in place with good seal to fit.  Is awaiting discharge to SNF. Wife to visit [place] today. Hopeful to discharge to [facility] pending her approval. She is to contact SW today.

## 2022-07-12 NOTE — Progress Notes (Signed)
Received patient in bed to unit.  Alert and oriented.  Informed consent signed and in chart.   Treatment initiated: 1625 Treatment completed: 2030  Patient tolerated well.  Transported back to the room  Alert, without acute distress.  Hand-off given to patient's nurse.   Access used: AVF Access issues: none  Total UF removed: 2960 Medication(s) given: Cefazlin, Gentamicin Post HD VS: 120/60,89,24,97% Post HD weight: 121.8kg   [facility]

## 2022-07-12 NOTE — Progress Notes (Signed)
[facility] Progress Note   Subjective: Seen in room with RN giving meds. Says he doesn't know me but thankfully today he is joking. Seems much better today. HD later today.     Objective Vitals:   07/11/22 0841 07/11/22 1635 07/11/22 2202 07/12/22 0937  BP: 132/61 (!) 108/52 (!) 116/54 (!) 113/51  Pulse: 77 88 88 66  Resp: '20 20 20 20  '$ Temp: 98.5 F (36.9 C) 99 F (37.2 C) 99.9 F (37.7 C) 98.4 F (36.9 C)  TempSrc: Oral  Oral   SpO2: 100% 99% 100% 95%  Weight:       Physical Exam General: Chronically ill appearing male in NAD Heart: S1,S2 RRR No M/R/G Lungs: CTAB A/P Abdomen: Obese, NABS. Condom cath in place, clear yellow urine.  Extremities: L BKA in stump protector. Wound vac in place. R BKA with prosthetic sleeve in place.  Dialysis Access: L AVF + T/B   Additional Objective Labs: Basic Metabolic Panel: Recent Labs  Lab 07/06/22 0501 07/09/22 0315  NA 135 134*  K 3.8 4.2  CL 94* 93*  CO2 27 26  GLUCOSE 238* 172*  BUN 31* 43*  CREATININE 6.25* 6.57*  CALCIUM 7.2* 7.7*  PHOS  --  6.4*   Liver Function Tests: Recent Labs  Lab 07/09/22 0315  ALBUMIN 2.3*   No results for input(s): "LIPASE", "AMYLASE" in the last 168 hours. CBC: Recent Labs  Lab 07/06/22 0501  WBC 8.3  HGB 8.8*  HCT 25.7*  MCV 91.1  PLT 205   Blood Culture    Component Value Date/Time   SDES BLOOD RIGHT FOREARM 06/25/2022 1258   SPECREQUEST  06/25/2022 1258    BOTTLES DRAWN AEROBIC AND ANAEROBIC Blood Culture adequate volume   CULT  06/25/2022 1258    NO GROWTH 5 DAYS Performed at [facility], [place] [address], [place],  [address]    REPTSTATUS 06/30/2022 FINAL 06/25/2022 1258    Cardiac Enzymes: No results for input(s): "CKTOTAL", "CKMB", "CKMBINDEX", "TROPONINI" in the last 168 hours. CBG: Recent Labs  Lab 07/11/22 0841 07/11/22 1142 07/11/22 1638 07/11/22 2217 07/12/22 0935  GLUCAP 149* 202* 334* 366* 193*   Iron Studies: No  results for input(s): "IRON", "TIBC", "TRANSFERRIN", "FERRITIN" in the last 72 hours. '@lablastinr3'$ @ Studies/Results: No results found. Medications:   ceFAZolin (ANCEF) IV Stopped (07/10/22 1905)   gentamicin Stopped (07/10/22 1908)    amiodarone  100 mg Oral Daily   apixaban  5 mg Oral BID   atorvastatin  40 mg Oral QHS   Chlorhexidine Gluconate Cloth  6 each Topical Q0600   darbepoetin (ARANESP) injection - DIALYSIS  100 mcg Subcutaneous Q Sat-1800   docusate sodium  100 mg Oral Daily   doxercalciferol  4 mcg Oral Q T,Th,Sa-HD   fentaNYL  1 patch Transdermal Q72H   insulin aspart  0-6 Units Subcutaneous TID WC   insulin aspart  3 Units Subcutaneous TID WC   insulin glargine-yfgn  35 Units Subcutaneous QHS   midodrine  10 mg Oral Q T,Th,Sa-HD   nutrition supplement (JUVEN)  1 packet Oral BID BM   pantoprazole  40 mg Oral Daily   pregabalin  100 mg Oral BID   rOPINIRole  2 mg Oral QHS   tamsulosin  0.4 mg Oral Daily   torsemide  80 mg Oral Once per day on Sun Mon Wed Fri   zinc sulfate  220 mg Oral Daily     Outpatient HD orders:  [place]  TTS  4:15 450/800 112 kg 2.0 K/2.0 Ca AVF - No heparin  - mircera 100 mcg every 4 weeks (to be given on 10/28 - last outpatient  - hectorol 4 mcg every tx;  Last outpatient HD on 10/12      Assessment/Plan:    # Osteomyelitis left foot - s/p left BKA on 45/62/56   # PAD  - complicates wound healing; note BKA as above - I have encouraged him to speak with his team about his right leg prosthesis not fitting well    # ESRD  - HD per TTS schedule.Next HD 07/14/2022.    # Volume - note he will have a lower EDW s/p the left BKA. HD 10/31 Net UF 3.9. Still 9.2 kg above OP EDW. Continue to attempt to lower volume as tolerated.  He makes urine. Will add Torsemide on non HD days to attempt to offset high IDWG.    # HTN - Acceptable. On Midodrine.    # Anemia CKD  - Note ESA is due 07/07/22 per outpatient orders.  Started on  aranesp 100 mcg weekly on Saturdays while here   # Metabolic bone disease  - continue hectorol (to be given with HD - please do not list on outpatient med list)    # Possible delirium-monitor closely. Has been on multiple pain medications, is post anesthesia. Called Wife and discussed all parameters with wife and patient. They believe he has received too much pain medications, do not believe dry weight is correct. Will adjust pain medication frequency. Discussed EDW, and will attempt to lower EDW.    Disposition per primary team.  Discharge orders in place but no available SNF.   [name] H. Harold Cremeens NP-C 07/12/2022, 9:48 AM  [employer] [phone]

## 2022-07-12 NOTE — Inpatient Diabetes Management (Signed)
Inpatient Diabetes Program Recommendations  AACE/ADA: New Consensus Statement on Inpatient Glycemic Control   Target Ranges:  Prepandial:   less than 140 mg/dL      Peak postprandial:   less than 180 mg/dL (1-2 hours)      Critically ill patients:  140 - 180 mg/dL    Latest Reference Range & Units 07/11/22 08:41 07/11/22 11:42 07/11/22 16:38 07/11/22 22:17  Glucose-Capillary 70 - 99 mg/dL 149 (H) 202 (H) 334 (H) 366 (H)   Review of Glycemic Control  Diabetes history: DM2 Outpatient Diabetes medications: Lantus 30 units QHS, Humalog 4 units TID with meals Current orders for Inpatient glycemic control: Semglee 35 units QHS, Novolog 0-6 units TID with meals, Novolog 3 units TID with meals  Inpatient Diabetes Program Recommendations:    Insulin: Please considering increasing meal coverage to Novolog 5 units TID with meals and adding Novolog 0-5 units QHS.   Thanks,  Barnie Alderman, RN, MSN, [place] Diabetes Coordinator Inpatient Diabetes Program [phone] (Team Pager from 8am to [place])

## 2022-07-13 ENCOUNTER — Telehealth: Payer: Self-pay | Admitting: Cardiology

## 2022-07-13 ENCOUNTER — Inpatient Hospital Stay: Payer: Medicare HMO | Admitting: Infectious Diseases

## 2022-07-13 LAB — GLUCOSE, CAPILLARY
Glucose-Capillary: 146 mg/dL — ABNORMAL HIGH (ref 70–99)
Glucose-Capillary: 190 mg/dL — ABNORMAL HIGH (ref 70–99)
Glucose-Capillary: 167 mg/dL — ABNORMAL HIGH (ref 70–99)
Glucose-Capillary: 198 mg/dL — ABNORMAL HIGH (ref 70–99)

## 2022-07-13 MED ORDER — SEVELAMER CARBONATE 800 MG PO TABS
1600.0000 mg | ORAL_TABLET | Freq: Three times a day (TID) | ORAL | Status: DC
  Administered 2022-07-13 – 2022-07-16 (×5): 1600 mg via ORAL
  Filled 2022-07-13 (×6): qty 2

## 2022-07-13 NOTE — Progress Notes (Signed)
Patient ID: [name], male   DOB: 12-21-1955, 66 y.o.   MRN: [id number] The patient is a 66 year old gentleman status post left below knee amputation on 07/04/22. This morning sitting up in bed. Comfortable and conversant with this breakfast. Wound vac in place with good seal to fit. No drainage in vac canister. The limb protector in place as well.   Working on discharge. Wife to re visit [place] today. She is considering accepting their offer for placement. Considering [facility] as well. Discussed the importance of rehab for his mobility, strength and care. They hope to make a choice his afternoon.   She is not comfortable with him discharging home  To change over to Prevena portable vac at time of discharge.  Suzan Slick, NP

## 2022-07-13 NOTE — Progress Notes (Signed)
Physical Therapy Treatment Patient Details Name: [name] MRN: [id number] DOB: 21-Feb-1956 Today's Date: 07/13/2022   History of Present Illness 66 yo male Admitted 10/24 for  dx sepsis with lactic acidosis. 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 S/p Lt BKA 10/25.    PT Comments    Pt received in supine, agreeable to therapy session with encouragement, with emphasis on instruction on safe technique options for transfers including log rolling for bed mobility given back pain and A/P transfer technique as well as importance of LE ROM and exercises for strengthening. Pt performed some LE exercises however limited due to bowel urgency during bed mobility and ultimately pt transferred to bed pan. Pt checked again 40 mins later but defers mobility progression as he is not done on bed pan; NT notified/aware, pt family in room and will let nursing staff know when he is done. Notified NT of pt skin breakdown on his back may need barrier ointment or dressing placed to protect the area. Pt continues to benefit from PT services to progress toward functional mobility goals.    Recommendations for follow up therapy are one component of a multi-disciplinary discharge planning process, led by the attending physician.  Recommendations may be updated based on patient status, additional functional criteria and insurance authorization.  Follow Up Recommendations  Skilled nursing-short term rehab (<3 hours/day) Can patient physically be transported by private vehicle: No   Assistance Recommended at Discharge Frequent or constant Supervision/Assistance  Patient can return home with the following Help with stairs or ramp for entrance;Assist for transportation;Two people to help with walking and/or transfers;Two people  to help with bathing/dressing/bathroom;Assistance with cooking/housework;Direct supervision/assist for medications management   Equipment Recommendations  None recommended by PT (defer to post-acute; currently would need mechanical lift and hospital bed)    Recommendations for Other Services       Precautions / Restrictions Precautions Precautions: Fall;Other (comment) (BKA) Precaution Booklet Issued: No Precaution Comments: Lt BKA precautions reviewed Required Braces or Orthoses: Other Brace Other Brace: limb protector Restrictions Weight Bearing Restrictions: Yes LLE Weight Bearing: Non weight bearing Other Position/Activity Restrictions: has RLE prosthetic     Mobility  Bed Mobility Overal bed mobility: Needs Assistance Bed Mobility: Rolling, Supine to Sit Rolling: Mod assist   Supine to sit: HOB elevated, Max assist     General bed mobility comments: rolling x3 reps to L/R sides for pad adjustment and placement of bed pan; pt attempting to pull into long sitting during readjustment in bed but unable to fully raise upright with maxA due to low back pain and bowel urgency. pt then transferred to bed pad    Transfers                   General transfer comment: pt defers A/P transfer to [facility] as PTA suggests due to increased bowel urgency; pt agreeable only to bed pan       Balance Overall balance assessment: Needs assistance Sitting-balance support: Feet unsupported, Bilateral upper extremity supported Sitting balance-Leahy Scale: Poor Sitting balance - Comments: unable to tolerate long sitting in air bed due to low back pain  Cognition Arousal/Alertness: Awake/alert Behavior During Therapy: WFL for tasks assessed/performed Overall Cognitive Status: No family/caregiver present to determine baseline cognitive functioning                                 General Comments: slow processing,  occasionally requires cues repeated. Pt self-limiting due to bowel urgency during session.        Exercises Amputee Exercises Quad Sets: AROM, Both, 5 reps, Supine Knee Flexion: AROM, Both, 5 reps, Supine Knee Extension: AROM, 5 reps, Both, Supine Other Exercises Other Exercises: supine BUE AROM: "bed crunches"x3 reps, pt limited due to bowel urgency defers more    General Comments General comments (skin integrity, edema, etc.): Pt checked again 40 mins after transfer onto bed pan but reports he is not done. Family states he often sits on toilet for ~2 hours at home. NT aware also.      Pertinent Vitals/Pain Pain Assessment Pain Assessment: Faces Faces Pain Scale: Hurts little more Pain Location: lower back and LLE Pain Descriptors / Indicators: Guarding, Grimacing, Sore Pain Intervention(s): Limited activity within patient's tolerance, Monitored during session, Repositioned           PT Goals (current goals can now be found in the care plan section) Acute Rehab PT Goals Patient Stated Goal: to get stronger and be able to get OOB to chair PT Goal Formulation: With patient Time For Goal Achievement: 07/20/22 Progress towards PT goals: Progressing toward goals    Frequency    Min 3X/week      PT Plan Current plan remains appropriate       AM-PAC PT "6 Clicks" Mobility   Outcome Measure  Help needed turning from your back to your side while in a flat bed without using bedrails?: A Lot Help needed moving from lying on your back to sitting on the side of a flat bed without using bedrails?: A Lot Help needed moving to and from a bed to a chair (including a wheelchair)?: Total Help needed standing up from a chair using your arms (e.g., wheelchair or bedside chair)?: Total Help needed to walk in hospital room?: Total Help needed climbing 3-5 steps with a railing? : Total 6 Click Score: 8    End of Session Equipment Utilized During Treatment: Other (comment) (LLE limb  guard) Activity Tolerance: Patient limited by fatigue;Other (comment) (bowel urgency) Patient left: in bed;with call bell/phone within reach;with bed alarm set;with family/visitor present;Other (comment) (on bed pan) Nurse Communication: Mobility status;Precautions;Other (comment) (pt on bed pan) PT Visit Diagnosis: Muscle weakness (generalized) (M62.81);Difficulty in walking, not elsewhere classified (R26.2);Pain Pain - Right/Left: Left Pain - part of body: Leg     Time: 9675-9163 PT Time Calculation (min) (ACUTE ONLY): 8 min  Charges:  $Therapeutic Activity: 8-22 mins                     Florina Glas P., PTA Acute Rehabilitation Services Secure Chat Preferred 9a-5:30pm Office: [place] 07/13/2022, 6:23 PM

## 2022-07-13 NOTE — Plan of Care (Signed)
  Problem: Fluid Volume: Goal: Hemodynamic stability will improve Outcome: Not Progressing   Problem: Clinical Measurements: Goal: Diagnostic test results will improve Outcome: Not Progressing Goal: Signs and symptoms of infection will decrease Outcome: Not Progressing   Problem: Respiratory: Goal: Ability to maintain adequate ventilation will improve Outcome: Not Progressing   Problem: Education: Goal: Ability to describe self-care measures that may prevent or decrease complications (Diabetes Survival Skills Education) will improve Outcome: Not Progressing Goal: Individualized Educational Video(s) Outcome: Not Progressing   Problem: Coping: Goal: Ability to adjust to condition or change in health will improve Outcome: Not Progressing   Problem: Health Behavior/Discharge Planning: Goal: Ability to identify and utilize available resources and services will improve Outcome: Not Progressing Goal: Ability to manage health-related needs will improve Outcome: Not Progressing   Problem: Metabolic: Goal: Ability to maintain appropriate glucose levels will improve Outcome: Not Progressing   Problem: Nutritional: Goal: Maintenance of adequate nutrition will improve Outcome: Not Progressing Goal: Progress toward achieving an optimal weight will improve Outcome: Not Progressing   Problem: Activity: Goal: Ability to perform//tolerate increased activity and mobilize with assistive devices will improve Outcome: Not Progressing

## 2022-07-13 NOTE — Plan of Care (Signed)
  Problem: Fluid Volume: Goal: Hemodynamic stability will improve Outcome: Progressing   Problem: Health Behavior/Discharge Planning: Goal: Ability to manage health-related needs will improve Outcome: Progressing   Problem: Metabolic: Goal: Ability to maintain appropriate glucose levels will improve Outcome: Progressing   Problem: Nutritional: Goal: Maintenance of adequate nutrition will improve Outcome: Progressing

## 2022-07-13 NOTE — TOC Progression Note (Signed)
Transition of Care [facility]) - Progression Note    Patient Details  Name: [name] MRN: [id number] Date of Birth: 1955/11/28  Transition of Care [facility]) CM/SW Contact  Joanne Chars, LCSW Phone Number: 07/13/2022, 3:13 PM  Clinical Narrative:  CSW spoke with pt wife [name].  She accepts offer at [facility].  Not happy.  CSW listened as she expressed frustration.  CSW spoke with [facility].  She will initiate auth.       Expected Discharge Plan: [place] Barriers to Discharge: Continued Medical Work up, SNF Pending bed offer  Expected Discharge Plan and Services Expected Discharge Plan: [place] In-house Referral: Clinical Social Work   Post Acute Care Choice: [place] Living arrangements for the past 2 months: [place] Expected Discharge Date: 07/10/22                                     Social Determinants of Health (SDOH) Interventions    Readmission Risk Interventions    06/30/2021   11:53 AM  Readmission Risk Prevention Plan  Transportation Screening Complete  Medication Review Press photographer) Complete  HRI or [place] Complete  SW Recovery Care/Counseling Consult Complete  [place] Not Applicable

## 2022-07-13 NOTE — Progress Notes (Signed)
[facility] Progress Note   Subjective:   Seen in room - he feels well, no CP or dyspnea. L leg pain mostly controlled. Has a few SNF options and wife touring today to decide.   Objective Vitals:   07/12/22 2000 07/12/22 2030 07/12/22 2228 07/13/22 0751  BP: 124/67 120/60 (!) 151/57 113/65  Pulse: 86 89 88 68  Resp:  (!) '24 18 17  '$ Temp:   99.8 F (37.7 C) 98.6 F (37 C)  TempSrc:   Oral Oral  SpO2:   97% 98%  Weight:       Physical Exam General: Well appearing man, NAD. Room air. 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 Access: L AVF + bruit  Additional Objective Labs: Basic Metabolic Panel: Recent Labs  Lab 07/09/22 0315  NA 134*  K 4.2  CL 93*  CO2 26  GLUCOSE 172*  BUN 43*  CREATININE 6.57*  CALCIUM 7.7*  PHOS 6.4*   Liver Function Tests: Recent Labs  Lab 07/09/22 0315  ALBUMIN 2.3*   Blood Culture    Component Value Date/Time   SDES BLOOD RIGHT FOREARM 06/25/2022 1258   SPECREQUEST  06/25/2022 1258    BOTTLES DRAWN AEROBIC AND ANAEROBIC Blood Culture adequate volume   CULT  06/25/2022 1258    NO GROWTH 5 DAYS Performed at [facility], [place] [address] [place],  [address]    REPTSTATUS 06/30/2022 FINAL 06/25/2022 1258   Cardiac Enzymes: No results for input(s): "CKTOTAL", "CKMB", "CKMBINDEX", "TROPONINI" in the last 168 hours. CBG: Recent Labs  Lab 07/12/22 0935 07/12/22 1131 07/12/22 2307 07/13/22 0725 07/13/22 1111  GLUCAP 193* 202* 263* 146* 190*   Medications:   ceFAZolin (ANCEF) IV Stopped (07/12/22 2148)    amiodarone  100 mg Oral Daily   apixaban  5 mg Oral BID   atorvastatin  40 mg Oral QHS   Chlorhexidine Gluconate Cloth  6 each Topical Q0600   darbepoetin (ARANESP) injection - DIALYSIS  100 mcg Subcutaneous Q Sat-1800   docusate sodium  100 mg Oral Daily   doxercalciferol  4 mcg Oral Q T,Th,Sa-HD    fentaNYL  1 patch Transdermal Q72H   insulin aspart  0-6 Units Subcutaneous TID WC   insulin aspart  3 Units Subcutaneous TID WC   insulin glargine-yfgn  35 Units Subcutaneous QHS   midodrine  10 mg Oral Q T,Th,Sa-HD   nutrition supplement (JUVEN)  1 packet Oral BID BM   pantoprazole  40 mg Oral Daily   pregabalin  100 mg Oral BID   rOPINIRole  2 mg Oral QHS   tamsulosin  0.4 mg Oral Daily   torsemide  80 mg Oral Once per day on Sun Mon Wed Fri   zinc sulfate  220 mg Oral Daily    Dialysis Orders: [place] TTS  4:15 450/800 112 kg 2.0 K/2.0 Ca AVF - No heparin  - mircera 100 mcg every 4 weeks (to be given on 10/28 - last outpatient  - hectorol 4 mcg every tx   Assessment/Plan: 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 Off abx at this time. PAD: Complicates wound healing ESRD: Continue HD on usual TTS schedule - next 11/4.  BP/volume: Well above prior outpatient EDW despite amputation - suspect scale error, but will also try to maximize UF with each EDW. On torsemide on non-HD days and midodrine with HD prn. Reviewing  outpatient records - was not reaching that dry weight, was leaving more 118kg range - see how low can get here and will adjust weight on d/c. Anemia of ESRD: Last Hgb 8.8 - check labs with next HD. Secondary HPTH: CorrCa ok, Phos high - restart home binder (Renvela 2/meals) Nutrition: On protein supps for wound healing T2DM: Per primary Dispo: To SNF for rehab - awaiting placement decisions.   Veneta Penton, PA-C 07/13/2022, 11:20 AM  [employer]

## 2022-07-13 NOTE — Telephone Encounter (Signed)
Patient is in hospital. Spouse called, and said he's having SOB. She's asking if you could stop by and see him whenever you are at the hospital.

## 2022-07-14 LAB — CBC
HCT: 26.6 % — ABNORMAL LOW (ref 39.0–52.0)
Hemoglobin: 8.9 g/dL — ABNORMAL LOW (ref 13.0–17.0)
MCH: 30.1 pg (ref 26.0–34.0)
MCHC: 33.5 g/dL (ref 30.0–36.0)
MCV: 89.9 fL (ref 80.0–100.0)
Platelets: 276 10*3/uL (ref 150–400)
RBC: 2.96 MIL/uL — ABNORMAL LOW (ref 4.22–5.81)
RDW: 13.2 % (ref 11.5–15.5)
WBC: 7 10*3/uL (ref 4.0–10.5)
nRBC: 0 % (ref 0.0–0.2)

## 2022-07-14 LAB — RENAL FUNCTION PANEL
Albumin: 2.4 g/dL — ABNORMAL LOW (ref 3.5–5.0)
Anion gap: 16 — ABNORMAL HIGH (ref 5–15)
BUN: 76 mg/dL — ABNORMAL HIGH (ref 8–23)
CO2: 24 mmol/L (ref 22–32)
Calcium: 8.5 mg/dL — ABNORMAL LOW (ref 8.9–10.3)
Chloride: 90 mmol/L — ABNORMAL LOW (ref 98–111)
Creatinine, Ser: 8.68 mg/dL — ABNORMAL HIGH (ref 0.61–1.24)
GFR, Estimated: 6 mL/min — ABNORMAL LOW (ref >60)
Glucose, Bld: 193 mg/dL — ABNORMAL HIGH (ref 70–99)
Phosphorus: 6.7 mg/dL — ABNORMAL HIGH (ref 2.5–4.6)
Potassium: 4.5 mmol/L (ref 3.5–5.1)
Sodium: 130 mmol/L — ABNORMAL LOW (ref 135–145)

## 2022-07-14 LAB — GLUCOSE, CAPILLARY
Glucose-Capillary: 188 mg/dL — ABNORMAL HIGH (ref 70–99)
Glucose-Capillary: 192 mg/dL — ABNORMAL HIGH (ref 70–99)

## 2022-07-14 MED ORDER — DOXERCALCIFEROL 4 MCG/2ML IV SOLN
INTRAVENOUS | Status: AC
  Administered 2022-07-14: 4 ug
  Filled 2022-07-14: qty 2

## 2022-07-14 NOTE — Progress Notes (Signed)
Received patient in bed to unit. Alert and oriented. Informed consent signed and in chart.   Treatment initiated: 0742 Treatment completed:    Patient tolerated well. Transported back to the room Alert, without acute distress. Hand-off given to patient's nurse.   Access used: AVF on left with 15 G needle  Access issues: see previous notes Dressing: gauze and tape  Total UF removed: 4L   Medication(s) given: hectarol 49mg and ancef 2g Post HD VS: see table below Post HD weight:119.3kg   07/14/22 1316  Vitals  BP 131/61  BP Location Right Arm  BP Method Automatic  Patient Position (if appropriate) Lying  Pulse Rate 60  Pulse Rate Source Monitor  Resp 16  Oxygen Therapy  SpO2 98 %  O2 Device Room Air  Patient Activity (if Appropriate) In bed  Pulse Oximetry Type Continuous  Post Treatment  Dialyzer Clearance Clear  Duration of HD Treatment -hour(s) 4 hour(s)  Hemodialysis Intake (mL) 200 mL  Liters Processed 85.7  Fluid Removed 4000 mL  Tolerated HD Treatment Yes  AVG/AVF Arterial Site Held (minutes) 5 minutes  AVG/AVF Venous Site Held (minutes) 5 minutes  Note  Observations tx tolerated well  Fistula / Graft Left Upper arm Arteriovenous fistula  Placement Date/Time: 07/21/20 0825   Placed prior to admission: No  Orientation: Left  Access Location: Upper arm  Access Type: Arteriovenous fistula  Site Condition No complications  Fistula / Graft Assessment Present;Thrill;Bruit  Status Flushed;Deaccessed  Drainage Description None

## 2022-07-14 NOTE — Plan of Care (Signed)
  Problem: Nutritional: Goal: Maintenance of adequate nutrition will improve Outcome: Progressing   Problem: Skin Integrity: Goal: Risk for impaired skin integrity will decrease Outcome: Progressing   Problem: Pain Management: Goal: Pain level will decrease with appropriate interventions Outcome: Progressing   Problem: Health Behavior/Discharge Planning: Goal: Ability to manage health-related needs will improve Outcome: Progressing   Problem: Activity: Goal: Risk for activity intolerance will decrease Outcome: Progressing

## 2022-07-14 NOTE — Progress Notes (Signed)
[facility] Progress Note   Subjective:  Seen on HD - 4L UFG and tolerating. No CP/dyspnea. Per notes, has accepted SNF offer - ok for discharge any time after HD today per renal team.  Objective Vitals:   07/13/22 1915 07/14/22 0357 07/14/22 0742 07/14/22 0809  BP: (!) 137/59 127/61 137/62 (!) 119/92  Pulse: 77 68 67 61  Resp: '17 17  17  '$ Temp: 98.2 F (36.8 C) 98.3 F (36.8 C) 98.2 F (36.8 C)   TempSrc:      SpO2: 97% 100%  100%  Weight:   122.3 kg    Physical Exam General: Well appearing man, NAD. Room air. 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 Access: L AVF + bruit  Additional Objective Labs: Basic Metabolic Panel: Recent Labs  Lab 07/09/22 0315  NA 134*  K 4.2  CL 93*  CO2 26  GLUCOSE 172*  BUN 43*  CREATININE 6.57*  CALCIUM 7.7*  PHOS 6.4*   Liver Function Tests: Recent Labs  Lab 07/09/22 0315  ALBUMIN 2.3*   CBC: Recent Labs  Lab 07/14/22 0803  WBC 7.0  HGB 8.9*  HCT 26.6*  MCV 89.9  PLT 276   Medications:   ceFAZolin (ANCEF) IV Stopped (07/12/22 2148)    amiodarone  100 mg Oral Daily   apixaban  5 mg Oral BID   atorvastatin  40 mg Oral QHS   Chlorhexidine Gluconate Cloth  6 each Topical Q0600   darbepoetin (ARANESP) injection - DIALYSIS  100 mcg Subcutaneous Q Sat-1800   docusate sodium  100 mg Oral Daily   doxercalciferol  4 mcg Oral Q T,Th,Sa-HD   fentaNYL  1 patch Transdermal Q72H   insulin aspart  0-6 Units Subcutaneous TID WC   insulin aspart  3 Units Subcutaneous TID WC   insulin glargine-yfgn  35 Units Subcutaneous QHS   midodrine  10 mg Oral Q T,Th,Sa-HD   nutrition supplement (JUVEN)  1 packet Oral BID BM   pantoprazole  40 mg Oral Daily   pregabalin  100 mg Oral BID   rOPINIRole  2 mg Oral QHS   sevelamer carbonate  1,600 mg Oral TID WC   tamsulosin  0.4 mg Oral Daily   torsemide  80 mg Oral Once per day on Sun  Mon Wed Fri   zinc sulfate  220 mg Oral Daily    Dialysis Orders: [facility]  4:15 450/800 112 kg 2.0 K/2.0 Ca AVF - No heparin  - mircera 100 mcg every 4 weeks (to be given on 10/28 - last outpatient  - hectorol 4 mcg every tx   Assessment/Plan: 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 Off abx at this time. PAD: Complicates wound healing ESRD: Continue HD on usual TTS schedule - HD now, 4L UFG today. BP/volume: Well above prior outpatient EDW despite amputation - suspect scale error, but will also try to maximize UF with each EDW. On torsemide on non-HD days and midodrine with HD prn. Reviewing outpatient records - was not reaching that dry weight, was leaving more 118kg range - see how low can get here and will adjust weight on d/c. Anemia of ESRD: Last Hgb 8.9 - check labs with next HD. Secondary HPTH: CorrCa ok, Phos high - restarting home binder (Renvela 2/meals) Nutrition: On protein supps for wound healing T2DM: Per primary Dispo: To SNF for rehab  Veneta Penton, PA-C  07/14/2022, 8:21 AM  [employer]

## 2022-07-15 LAB — GLUCOSE, CAPILLARY
Glucose-Capillary: 130 mg/dL — ABNORMAL HIGH (ref 70–99)
Glucose-Capillary: 130 mg/dL — ABNORMAL HIGH (ref 70–99)
Glucose-Capillary: 283 mg/dL — ABNORMAL HIGH (ref 70–99)
Glucose-Capillary: 205 mg/dL — ABNORMAL HIGH (ref 70–99)

## 2022-07-15 NOTE — TOC Progression Note (Signed)
Transition of Care [facility]) - Progression Note    Patient Details  Name: GEVORG BRUM MRN: [id number] Date of Birth: 04-02-1956  [facility] CM/SW Contact  Emeterio Reeve, [place] Phone Number: 07/15/2022, 10:34 AM  Clinical Narrative:     Pts insurance authorization  is pending for SNF.   Expected Discharge Plan: [place] Barriers to Discharge: Continued Medical Work up, SNF Pending bed offer  Expected Discharge Plan and Services Expected Discharge Plan: [place] In-house Referral: Clinical Social Work   Post Acute Care Choice: [place] Living arrangements for the past 2 months: [place] Expected Discharge Date: 07/15/22                                     Social Determinants of Health (SDOH) Interventions    Readmission Risk Interventions    06/30/2021   11:53 AM  Readmission Risk Prevention Plan  Transportation Screening Complete  Medication Review Press photographer) Complete  HRI or [place] Complete  SW Recovery Care/Counseling Consult Complete  Palliative Care Screening Not [place] Not Applicable   Emeterio Reeve, [place] Clinical Social Worker

## 2022-07-15 NOTE — Discharge Summary (Signed)
Discharge Diagnoses:  Principal Problem:   S/P BKA (below knee amputation) unilateral, left (HCC) Active Problems:   Subacute osteomyelitis, left ankle and foot (HCC)   Chest wall pain   Surgeries: Procedure(s): LEFT BELOW KNEE AMPUTATION on 07/04/2022    Consultants: Treatment Team:  Claudia Desanctis, MD Roney Jaffe, MD  Discharged Condition: Improved  Hospital Course: [name] is an 66 y.o. male who was admitted 07/03/2022 with a chief complaint of osteomyelitis left foot, with a final diagnosis of Osteomyelitis Left Foot.  Patient was brought to the operating room on 07/04/2022 and underwent Procedure(s): LEFT BELOW KNEE AMPUTATION.    Patient was given perioperative antibiotics:  Anti-infectives (From admission, onward)    Start     Dose/Rate Route Frequency Ordered Stop   07/05/22 1200  ceFAZolin (ANCEF) IVPB  Status:  Discontinued       Note to Pharmacy: Indication:  GBS bacteremia  First Dose: Yes Last Day of Therapy:  07/23/22 Labs - Once weekly:  CBC/D and BMP, Labs - Every other week:  ESR and CRP Method of administration: IV Push Method of administration may be changed at the discretion of home infusion pharmacist based upon assessment of   2 g Intravenous Every T-Th-Sa (Hemodialysis) 07/04/22 1137 07/04/22 1426   07/05/22 1200  gentamicin (GARAMYCIN) IVPB  Status:  Discontinued       Note to Pharmacy: Indication:  GBS AV/MV IE - synergy Last Day of Therapy:  07/12/22 Labs - _0 0 mg 200 mL/hr over 30 Minutes Intravenous Every T-Th-Sa (Hemodialysis) 07/04/22 1429 07/12/22 2132   07/04/22 1800  ceFAZolin (ANCEF) IVPB 1 g/50 mL premix        1 g 100 mL/hr over 30 Minutes Intravenous Every 24 hours 07/03/22 1956 07/04/22 1913   07/03/22 1800  ceFAZolin (ANCEF) IVPB 2g/100 mL premix  Status:  Discontinued        2 g 200 mL/hr over 30 Minutes Intravenous Every 6 hours 07/03/22 1755 07/03/22 1956     .  Patient was given sequential compression devices, early ambulation, and aspirin for DVT prophylaxis.  Recent vital signs: Patient Vitals for the past 24 hrs:  BP Temp Temp src Pulse Resp SpO2  07/15/22 0720 (!) 109/59 98.7 F (37.1 C) Oral 70 18 96 %  07/15/22 0355 (!) 108/53 98.3 F (36.8 C) -- 66 17 93 %  07/14/22 2006 (!) 142/65 98 F (36.7 C) -- 86 18 98 %  07/14/22 1436 138/71 98.7 F (37.1 C) Oral 95 -- 99 %  07/14/22 1316 131/61 -- -- 60 16 98 %  07/14/22 1230 109/64 -- -- 94 15 100 %  07/14/22 1200 121/60 -- -- 80 17 99 %  07/14/22 1130 (!) 99/55 -- -- 73 17 97 %  07/14/22 1100 (!) 96/50 -- -- 70 15 95 %  07/14/22 1030 (!) 125/54 -- -- 66 16 99 %  .  Recent laboratory studies: No results found.  Discharge Medications:   Allergies as of 07/15/2022       Reactions   Contrast Media [iodinated Contrast Media] Shortness Of Breath, Other (See Comments)   Difficulty breathing and altered mental status   Ivp Dye [iodinated Contrast Media] Anaphylaxis, Shortness Of Breath, Other (See Comments)   Breathing problems, altered mental state   Adhesive [tape] Rash, Other (See Comments)   Rash after 1 day of use Do not leave on longer then 3 days with out be changed   Latex Rash, Other (See Comments)   A severe rash appears after the first 24 hours of being placed        Medication List     STOP taking these medications    silver sulfADIAZINE 1 % cream Commonly known as: SILVADENE       TAKE these  medications    amiodarone 200 MG tablet Commonly known as: PACERONE Take 0.5 tablets (100 mg total) by mouth daily.   amLODipine 5 MG tablet Commonly known as: NORVASC Take 5 mg by mouth daily as needed for dizziness. Take one tablet for BP > 160 mm Hg   apixaban 5 MG Tabs tablet Commonly known as: ELIQUIS Take 1 tablet (5 mg total) by mouth 2 (two) times daily.   atorvastatin 40 MG tablet Commonly known as: LIPITOR Take 40 mg by mouth at bedtime.   ceFAZolin  IVPB Commonly known as: ANCEF Inject 2 g into the vein Every Tuesday,Thursday,and Saturday with dialysis for 25 days. Indication:  GBS bacteremia  First Dose: Yes Last Day of Therapy:  07/23/22 Labs - Once weekly:  CBC/D and BMP, Labs - Every other week:  ESR and CRP Method of administration: IV Push Method of administration may be changed at the discretion of home infusion pharmacist based upon assessment of the patient and/or caregiver's ability to self-administer the medication ordered.   Dialyvite/Zinc Tabs Take 1 tablet by mouth Every Tuesday,Thursday,and Saturday with dialysis.   famotidine 20 MG tablet Commonly known as: PEPCID Take 20 mg by mouth every other day.   fentaNYL 50 MCG/HR Commonly known as: Scribner 1 patch onto the skin every 3 (three) days.   folic acid 1 MG tablet Commonly known as: FOLVITE Take 1 mg by mouth daily.   GLUCOSAMINE PO Take 1 tablet by mouth 2 (two) times daily.   insulin glargine 100 UNIT/ML injection Commonly known as: LANTUS Inject 0.2 mLs (20 Units total) into the skin at bedtime. What changed: how much to take   insulin lispro 100 UNIT/ML injection Commonly known as: HUMALOG Inject 4-13 Units into the skin 3 (three) times daily before meals. Only with meals if needed   ketoconazole 2 % cream Commonly known as: NIZORAL Apply 1 application topically 2 (two) times daily as needed for irritation.   lidocaine-prilocaine cream Commonly known as: EMLA Apply 1  application topically See admin instructions. Prior to Dialysis days Tuesday,Thursday and saturday   loratadine 10 MG tablet Commonly known as: CLARITIN Take 10 mg by mouth daily.   metoCLOPramide 5 MG tablet Commonly known as: Reglan Take 1 tablet (5 mg total) by mouth 4 (four) times daily -  before meals and at bedtime. What changed: when to take this   midodrine 10 MG tablet Commonly known as: PROAMATINE Take 1 tablet (10 mg total) by mouth every other day. One hour before dialysis What changed: when to take this   NEEDLE (REUSABLE) 22 G 22G X 1-1/2"  Misc 1 Units by Does not apply route as directed. For B12 IM inj   nitroGLYCERIN 0.4 MG SL tablet Commonly known as: NITROSTAT Place 0.4 mg under the tongue every 5 (five) minutes as needed for chest pain. What changed: Another medication with the same name was removed. Continue taking this medication, and follow the directions you see here.   NovoLOG FlexPen 100 UNIT/ML FlexPen Generic drug: insulin aspart Inject 4 Units into the skin 3 (three) times daily.   omega-3 acid ethyl esters 1 g capsule Commonly known as: LOVAZA Take 2 g by mouth 2 (two) times daily. Mini   ondansetron 4 MG disintegrating tablet Commonly known as: ZOFRAN-ODT Take 1 tablet by mouth 4 (four) times daily as needed for nausea/vomiting.   oxyCODONE-acetaminophen 7.5-325 MG tablet Commonly known as: Percocet Take 1 tablet by mouth every 4 (four) hours as needed for severe pain. What changed:  when to take this reasons to take this   pantoprazole 40 MG tablet Commonly known as: PROTONIX Take 1 tablet (40 mg total) by mouth 2 (two) times daily for 30 days, THEN 1 tablet (40 mg total) daily. Start taking on: Jan 14, 2022 What changed: See the new instructions.   Polyvinyl Alcohol-Povidone PF 1.4-0.6 % Soln Place 1 drop into both eyes 2 (two) times daily as needed (dry eyes).   pregabalin 100 MG capsule Commonly known as: LYRICA Take 100 mg by  mouth 2 (two) times daily.   rOPINIRole 2 MG tablet Commonly known as: REQUIP Take 2 mg by mouth at bedtime.   SYRINGE 3CC/22GX1-1/2" 22G X 1-1/2" 3 ML Misc 1 Syringe by Does not apply route as directed. For b12 IM inj   tamsulosin 0.4 MG Caps capsule Commonly known as: FLOMAX Take 0.4 mg by mouth daily.       ASK your doctor about these medications    gentamicin  IVPB Commonly known as: GARAMYCIN Inject 100 mg into the vein Every Tuesday,Thursday,and Saturday with dialysis for 14 days. Indication:  GBS AV/MV IE - synergy Last Day of Therapy:  07/12/22 Labs - Sunday/Monday:  CBC/D, BMP, and gentamicin trough. Labs - Thursday:  BMP and gentamicin trough Labs - Every other week:  ESR and CRP Method of administration: Elastomeric Method of administration may be changed at the discretion of home infusion pharmacist based upon assessment of the patient and/or caregiver's ability to self-administer the medication ordered. Ask about: Should I take this medication?        Diagnostic Studies: CT ABDOMEN PELVIS WO CONTRAST  Result Date: 07/03/2022 CLINICAL DATA:  Left leg wound, sepsis, chest pain, epigastric pain EXAM: CT ABDOMEN AND PELVIS WITHOUT CONTRAST TECHNIQUE: Multidetector CT imaging of the abdomen and pelvis was performed following the standard protocol without IV contrast. Unenhanced CT was performed per clinician order. Lack of IV contrast limits sensitivity and specificity, especially for evaluation of abdominal/pelvic solid viscera. RADIATION DOSE REDUCTION: This exam was performed according to the departmental dose-optimization program which includes automated exposure control, adjustment of the mA and/or kV according to patient size and/or use of iterative reconstruction technique. COMPARISON:  06/23/2022, 06/13/2021 FINDINGS: Lower chest: No acute pleural or parenchymal lung disease. Hepatobiliary: Cholecystectomy. Unremarkable unenhanced appearance of the liver. No biliary  duct dilation. Pancreas: Stable 1.7 cm cystic region within the pancreatic body reference image 35/3, unchanged since 2020. Otherwise unremarkable unenhanced appearance. Spleen: Unremarkable unenhanced appearance. Adrenals/Urinary Tract: Bilateral renal atrophy consistent with end-stage renal disease. No acute renal abnormalities. No urinary tract calculi or  obstructive uropathy. The adrenals and bladder are unremarkable. Stomach/Bowel: No bowel obstruction or ileus. Postsurgical changes from prior sigmoid colon resection and reanastomosis. Normal appendix right lower quadrant. Diverticulosis of the descending colon without evidence of acute diverticulitis. No bowel wall thickening or inflammatory change. Vascular/Lymphatic: Diffuse atherosclerosis of the aorta and its branches. No pathologic adenopathy within the abdomen or pelvis. Reactive inguinal lymph nodes are again seen, left greater than right, decreased in prominence since prior study and likely related to the known left lower extremity ulceration and chronic left foot osteomyelitis described previously. Reproductive: Prostate is unremarkable. Other: No free fluid or free intraperitoneal gas. Small fat containing supraumbilical ventral hernia unchanged. Postsurgical changes from prior colostomy takedown in the right lower quadrant. No bowel herniation. Musculoskeletal: No acute or destructive bony lesions. Stable postsurgical changes spanning L4 through S1. IMPRESSION: 1. No acute intra-abdominal or intrapelvic process. 2. Descending colonic diverticulosis without diverticulitis. 3. Persistent but decreased reactive lymph nodes primarily in the left inguinal region, related to the patient's known chronic left lower extremity ulceration and osteomyelitis. 4. 1.7 cm cyst within the pancreatic body, unchanged since 2022. If not previously evaluated, when the patient is clinically stable and able to follow directions and hold their breath (preferably as an  outpatient) further evaluation with dedicated abdominal MRI should be considered. 5.  Aortic Atherosclerosis (ICD10-I70.0). Electronically Signed   By: Randa Ngo M.D.   On: 07/03/2022 16:33   DG Chest 2 View  Result Date: 07/03/2022 CLINICAL DATA:  Chest and abdominal pain EXAM: CHEST - 2 VIEW COMPARISON:  06/23/2022 FINDINGS: Cardiomegaly. Both lungs are clear. Disc degenerative disease of the thoracic spine. IMPRESSION: Cardiomegaly without acute abnormality of the lungs. Electronically Signed   By: Delanna Ahmadi M.D.   On: 07/03/2022 13:20   ECHOCARDIOGRAM COMPLETE  Result Date: 06/27/2022    ECHOCARDIOGRAM REPORT   Patient Name:   [name] Date of Exam: 06/25/2022 Medical Rec #:  [id number]     Height:       74.0 in Accession #:    [id number]    Weight:       277.1 lb Date of Birth:  Nov 30, 1955     BSA:          2.497 m Patient Age:    46 years      BP:           131/59 mmHg Patient Gender: M             HR:           74 bpm. Exam Location:  Inpatient Procedure: 2D Echo, Cardiac Doppler, Color Doppler and Intracardiac            Opacification Agent Indications:     Bacteremia  History:         Patient has prior history of Echocardiogram examinations. CAD,                  PAD; Risk Factors:Hypertension and Diabetes.  Sonographer:     Meagan Baucom RDCS, FE, PE Referring Phys:  [place] Diagnosing Phys: Adrian Prows MD IMPRESSIONS  1. Left ventricular ejection fraction, by estimation, is 55 to 60%. Left ventricular ejection fraction by PLAX is 63 %. The left ventricle has normal function. The left ventricle has no regional wall motion abnormalities. There is mild left ventricular hypertrophy. Left ventricular diastolic parameters are consistent with Grade II diastolic dysfunction (pseudonormalization). Elevated left ventricular end-diastolic pressure.  2. Right ventricular  systolic function is normal. The right ventricular size is normal.  3. Left atrial size was mildly dilated.  4.  The mitral valve is degenerative. Trivial mitral valve regurgitation. No evidence of mitral stenosis.  5. The aortic valve is tricuspid. There is mild calcification of the aortic valve. There is mild thickening of the aortic valve. Aortic valve regurgitation is not visualized. Mild aortic valve stenosis. Comparison(s): No significant change from 06/17/2021, valvular calcification was noted previously. Clinical correlation recommended. Conclusion(s)/Recommendation(s): Findings consistent with hypertensive heart disease. Findings concerning for Moderate AV and MV calcification, vegetations cannot be completely excluded. There appearas to be mobile mass on both leaflets, most probably related to calcification and reverbation, how ever vegetations cannot be excluded, would recommend Transesophageal Echocardiogram for clarification. FINDINGS  Left Ventricle: Left ventricular ejection fraction, by estimation, is 55 to 60%. Left ventricular ejection fraction by PLAX is 63 %. The left ventricle has normal function. The left ventricle has no regional wall motion abnormalities. Definity contrast agent was given IV to delineate the left ventricular endocardial borders. The left ventricular internal cavity size was normal in size. There is mild left ventricular hypertrophy. Left ventricular diastolic parameters are consistent with Grade II diastolic dysfunction (pseudonormalization). Elevated left ventricular end-diastolic pressure. Right Ventricle: The right ventricular size is normal. No increase in right ventricular wall thickness. Right ventricular systolic function is normal. Left Atrium: Left atrial size was mildly dilated. Right Atrium: Right atrial size was normal in size. Pericardium: There is no evidence of pericardial effusion. Mitral Valve: The mitral valve is degenerative in appearance. There is moderate thickening of the mitral valve leaflet(s). There is moderate calcification of the mitral valve leaflet(s).  Normal mobility of the mitral valve leaflets. Mild mitral annular calcification. Trivial mitral valve regurgitation. No evidence of mitral valve stenosis. Tricuspid Valve: The tricuspid valve is normal in structure. Tricuspid valve regurgitation is not demonstrated. No evidence of tricuspid stenosis. Aortic Valve: The aortic valve is tricuspid. There is mild calcification of the aortic valve. There is mild thickening of the aortic valve. Aortic valve regurgitation is not visualized. Mild aortic stenosis is present. Pulmonic Valve: The pulmonic valve was grossly normal. Pulmonic valve regurgitation is not visualized. Aorta: The aortic root is normal in size and structure. IAS/Shunts: No atrial level shunt detected by color flow Doppler.  LEFT VENTRICLE PLAX 2D LV EF:         Left            Diastology                ventricular     LV e' medial:    5.55 cm/s                ejection        LV E/e' medial:  19.3                fraction by     LV e' lateral:   9.46 cm/s                PLAX is 63      LV E/e' lateral: 11.3                %. LVIDd:         4.70 cm LVIDs:         3.10 cm LV PW:         1.20 cm LV IVS:        1.20 cm LVOT diam:  2.50 cm LV SV:         115 LV SV Index:   46 LVOT Area:     4.91 cm  RIGHT VENTRICLE RV S prime:     11.40 cm/s TAPSE (M-mode): 1.6 cm LEFT ATRIUM             Index        RIGHT ATRIUM           Index LA diam:        4.10 cm 1.64 cm/m   RA Area:     18.40 cm LA Vol (A2C):   65.4 ml 26.20 ml/m  RA Volume:   48.00 ml  19.23 ml/m LA Vol (A4C):   83.0 ml 33.25 ml/m LA Biplane Vol: 80.3 ml 32.16 ml/m  AORTIC VALVE LVOT Vmax:   104.00 cm/s LVOT Vmean:  68.700 cm/s LVOT VTI:    0.234 m  AORTA Ao Root diam: 4.10 cm Ao Asc diam:  3.80 cm MITRAL VALVE MV Area (PHT): 2.61 cm     SHUNTS MV Decel Time: 291 msec     Systemic VTI:  0.23 m MV E velocity: 107.00 cm/s  Systemic Diam: 2.50 cm MV A velocity: 123.00 cm/s MV E/A ratio:  0.87 Adrian Prows MD Electronically signed by Adrian Prows MD  Signature Date/Time: 06/27/2022/8:50:54 PM    Final    CT CHEST ABDOMEN PELVIS WO CONTRAST  Result Date: 06/23/2022 CLINICAL DATA:  Left foot osteomyelitis. Clinical concern for sepsis. Diabetes. EXAM: CT CHEST, ABDOMEN AND PELVIS WITHOUT CONTRAST TECHNIQUE: Multidetector CT imaging of the chest, abdomen and pelvis was performed following the standard protocol without IV contrast. RADIATION DOSE REDUCTION: This exam was performed according to the departmental dose-optimization program which includes automated exposure control, adjustment of the mA and/or kV according to patient size and/or use of iterative reconstruction technique. COMPARISON:  None Available. FINDINGS: CT CHEST FINDINGS Cardiovascular: Enlarged heart. Atheromatous calcifications, including the coronary arteries and aorta. Mediastinum/Nodes: No enlarged mediastinal, hilar, or axillary lymph nodes. Thyroid gland, trachea, and esophagus demonstrate no significant findings. Lungs/Pleura: Mild bibasilar dependent atelectasis. 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 Lower cervical spine fixation hardware. CT ABDOMEN PELVIS FINDINGS Hepatobiliary: No focal liver abnormality is seen. Status post cholecystectomy. No biliary dilatation. Pancreas: Mild pancreatic atrophy. Spleen: Normal in size without focal abnormality. Adrenals/Urinary Tract: Normal appearing adrenal glands. Stable small exophytic right renal cyst. Unremarkable left kidney, ureters and urinary bladder. Stomach/Bowel: Descending and sigmoid colon diverticula. Mid sigmoid colon anastomosis. Anastomosis involving a dilated distal small bowel loop containing fecalized material. Small appendicoliths in the proximal appendix without evidence of appendicitis. Unremarkable stomach. Vascular/Lymphatic: Atheromatous arterial calcifications without aneurysm. Multiple mildly enlarged left inguinal lymph nodes with central low density. The largest has a short axis  diameter of 13 mm on image number 129/3, previously 9 mm. Reproductive: Prostate is unremarkable. Other: Small bilateral inguinal hernias containing fat. 3 supraumbilical ventral hernias containing fat. Midline infraumbilical scar and right lower quadrant anterior scar. Musculoskeletal: Interbody and pedicle screw and rod fusion at the L4 through S1 levels. Lumbar spine degenerative changes. IMPRESSION: 1. Multiple mildly enlarged left inguinal lymph nodes with central low density. This is nonspecific with differential considerations including adenitis, reactive adenopathy and metastatic adenopathy. 2. No acute abnormality in the abdomen or pelvis. 3. Cardiomegaly. 4.  Calcific coronary artery and aortic atherosclerosis. 5. Colonic diverticulosis. 6. Multiple ventral hernias containing fat. Aortic Atherosclerosis (ICD10-I70.0). Electronically Signed   By: Percell Locus.D.  On: 06/23/2022 17:51   CT Foot Left Wo Contrast  Result Date: 06/23/2022 CLINICAL DATA:  Foot ulcer, concern for osteomyelitis EXAM: CT OF THE LEFT FOOT WITHOUT CONTRAST TECHNIQUE: Multidetector CT imaging of the left foot was performed according to the standard protocol. Multiplanar CT image reconstructions were also generated. RADIATION DOSE REDUCTION: This exam was performed according to the departmental dose-optimization program which includes automated exposure control, adjustment of the mA and/or kV according to patient size and/or use of iterative reconstruction technique. COMPARISON:  X-ray 06/23/2022, MRI 01/12/2022 FINDINGS: Bones/Joint/Cartilage Bones are diffusely demineralized. 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 Cortical thickening and sclerosis of the third and fourth metatarsal bases and to a lesser degree within the cuboid compatible with sequela of chronic osteomyelitis. No evidence of a new erosion or aggressive periosteal reaction. No fracture or dislocation. Similar degenerative changes of the foot. No  tibiotalar or subtalar joint effusions. Ligaments Suboptimally assessed by CT. Muscles and Tendons Denervation and amputation changes of the musculotendinous structures of the foot. No acute findings. No appreciable tenosynovial fluid collection. Soft tissues Shallow soft tissue ulceration at the lateral aspect of the hindfoot in the region of the calcaneocuboid joint. Soft tissue edema at the lateral and dorsal aspects of the foot. No organized fluid collection. No soft tissue gas. IMPRESSION: 1. Shallow soft tissue ulceration at the lateral aspect of the hindfoot in the region of the calcaneocuboid joint with adjacent cellulitis. 2. Findings of chronic osteomyelitis involving the third and fourth metatarsal bases and adjacent cuboid. No evidence of a new erosion or aggressive periosteal reaction to suggest acute osteomyelitis by CT. Electronically Signed   By: Davina Poke D.O.   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 No recent fracture or dislocation is seen. Osteopenia is seen in bony structures. No focal lytic lesions are seen. Plantar spur is seen in calcaneus. Degenerative changes are noted in the intertarsal and tarsometatarsal joints. Arterial calcifications are seen in soft tissues. Overall, no significant interval changes are noted. IMPRESSION: No fracture or dislocation is seen. Postsurgical changes as described in the body of the report. There are no focal lytic lesions. If there is clinical suspicion for osteomyelitis, follow-up MRI may be considered. Electronically Signed   By: Elmer Picker M.D.   On: 06/23/2022 15:47   DG Chest 2 View  Result Date: 06/23/2022 CLINICAL DATA:  Suspected sepsis EXAM: CHEST - 2 VIEW COMPARISON:  01/11/2022 FINDINGS: The heart size and mediastinal  contours are within normal limits. Both lungs are clear. The visualized skeletal structures are unremarkable. IMPRESSION: No active cardiopulmonary disease. Electronically Signed   By: Nelson Chimes M.D.   On: 06/23/2022 15:44    Patient benefited maximally from their hospital stay and there were no complications.     Disposition: Discharge disposition: [facility]      Discharge Instructions     Call MD / Call 911   Complete by: As directed    If you experience chest pain or shortness of breath, CALL 911 and be transported to the hospital emergency room.  If you develope a fever above 101 F, pus (white drainage) or increased drainage or redness at the wound, or calf pain, call your surgeon's office.   Call MD / Call 911   Complete  by: As directed    If you experience chest pain or shortness of breath, CALL 911 and be transported to the hospital emergency room.  If you develope a fever above 101 F, pus (white drainage) or increased drainage or redness at the wound, or calf pain, call your surgeon's office.   Constipation Prevention   Complete by: As directed    Drink plenty of fluids.  Prune juice may be helpful.  You may use a stool softener, such as Colace (over the counter) 100 mg twice a day.  Use MiraLax (over the counter) for constipation as needed.   Constipation Prevention   Complete by: As directed    Drink plenty of fluids.  Prune juice may be helpful.  You may use a stool softener, such as Colace (over the counter) 100 mg twice a day.  Use MiraLax (over the counter) for constipation as needed.   Diet - low sodium heart healthy   Complete by: As directed    Diet - low sodium heart healthy   Complete by: As directed    Increase activity slowly as tolerated   Complete by: As directed    Increase activity slowly as tolerated   Complete by: As directed    Negative Pressure Wound Therapy - Incisional   Complete by: As directed    Attach wound VAC dressing to the Praveena  plus portable wound VAC pump for discharge.   Post-operative opioid taper instructions:   Complete by: As directed    POST-OPERATIVE OPIOID TAPER INSTRUCTIONS: It is important to wean off of your opioid medication as soon as possible. If you do not need pain medication after your surgery it is ok to stop day one. Opioids include: Codeine, Hydrocodone(Norco, Vicodin), Oxycodone(Percocet, oxycontin) and hydromorphone amongst others.  Long term and even short term use of opiods can cause: Increased pain response Dependence Constipation Depression Respiratory depression And more.  Withdrawal symptoms can include Flu like symptoms Nausea, vomiting And more Techniques to manage these symptoms Hydrate well Eat regular healthy meals Stay active Use relaxation techniques(deep breathing, meditating, yoga) Do Not substitute Alcohol to help with tapering If you have been on opioids for less than two weeks and do not have pain than it is ok to stop all together.  Plan to wean off of opioids This plan should start within one week post op of your joint replacement. Maintain the same interval or time between taking each dose and first decrease the dose.  Cut the total daily intake of opioids by one tablet each day Next start to increase the time between doses. The last dose that should be eliminated is the evening dose.      Post-operative opioid taper instructions:   Complete by: As directed    POST-OPERATIVE OPIOID TAPER INSTRUCTIONS: It is important to wean off of your opioid medication as soon as possible. If you do not need pain medication after your surgery it is ok to stop day one. Opioids include: Codeine, Hydrocodone(Norco, Vicodin), Oxycodone(Percocet, oxycontin) and hydromorphone amongst others.  Long term and even short term use of opiods can cause: Increased pain response Dependence Constipation Depression Respiratory depression And more.  Withdrawal symptoms can  include Flu like symptoms Nausea, vomiting And more Techniques to manage these symptoms Hydrate well Eat regular healthy meals Stay active Use relaxation techniques(deep breathing, meditating, yoga) Do Not substitute Alcohol to help with tapering If you have been on opioids for less than two weeks and do not have pain than  it is ok to stop all together.  Plan to wean off of opioids This plan should start within one week post op of your joint replacement. Maintain the same interval or time between taking each dose and first decrease the dose.  Cut the total daily intake of opioids by one tablet each day Next start to increase the time between doses. The last dose that should be eliminated is the evening dose.          Contact information for follow-up providers     Newt Minion, MD Follow up in 1 week(s).   Specialty: Orthopedic Surgery Contact information: [place] [address] [phone]              Contact information for after-discharge care     [facility] .   Service: Skilled Nursing Contact information: [place] [phone]                      Signed: Newt Minion 07/15/2022, 10:17 AM

## 2022-07-15 NOTE — Plan of Care (Signed)
  Problem: Coping: Goal: Ability to adjust to condition or change in health will improve Outcome: Progressing   Problem: Health Behavior/Discharge Planning: Goal: Ability to manage health-related needs will improve Outcome: Progressing   Problem: Metabolic: Goal: Ability to maintain appropriate glucose levels will improve Outcome: Progressing

## 2022-07-15 NOTE — Progress Notes (Signed)
Patient ID: [name], male   DOB: 1956/06/16, 66 y.o.   MRN: [id number] Patient is status post left transtibial amputation.  He states he is ready for discharge to skilled nursing.  Orders are placed today.

## 2022-07-15 NOTE — Progress Notes (Signed)
[facility] Progress Note   Subjective:  Seen in room - no new concerns today. Possible d/c to SNF tomorrow. Dialyzed yesterday - net 4L off.  Objective Vitals:   07/14/22 1436 07/14/22 2006 07/15/22 0355 07/15/22 0720  BP: 138/71 (!) 142/65 (!) 108/53 (!) 109/59  Pulse: 95 86 66 70  Resp:  '18 17 18  '$ Temp: 98.7 F (37.1 C) 98 F (36.7 C) 98.3 F (36.8 C) 98.7 F (37.1 C)  TempSrc: Oral   Oral  SpO2: 99% 98% 93% 96%  Weight:      Height:       Physical Exam General: Well appearing man, NAD. Room air. 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 and wound vac Access: L AVF + bruit  Additional Objective Labs: Basic Metabolic Panel: Recent Labs  Lab 07/09/22 0315 07/14/22 0803  NA 134* 130*  K 4.2 4.5  CL 93* 90*  CO2 26 24  GLUCOSE 172* 193*  BUN 43* 76*  CREATININE 6.57* 8.68*  CALCIUM 7.7* 8.5*  PHOS 6.4* 6.7*   Liver Function Tests: Recent Labs  Lab 07/09/22 0315 07/14/22 0803  ALBUMIN 2.3* 2.4*   CBC: Recent Labs  Lab 07/14/22 0803  WBC 7.0  HGB 8.9*  HCT 26.6*  MCV 89.9  PLT 276   Medications:   ceFAZolin (ANCEF) IV 2 g (07/14/22 1211)    amiodarone  100 mg Oral Daily   apixaban  5 mg Oral BID   atorvastatin  40 mg Oral QHS   Chlorhexidine Gluconate Cloth  6 each Topical Q0600   darbepoetin (ARANESP) injection - DIALYSIS  100 mcg Subcutaneous Q Sat-1800   docusate sodium  100 mg Oral Daily   doxercalciferol  4 mcg Oral Q T,Th,Sa-HD   fentaNYL  1 patch Transdermal Q72H   insulin aspart  0-6 Units Subcutaneous TID WC   insulin aspart  3 Units Subcutaneous TID WC   insulin glargine-yfgn  35 Units Subcutaneous QHS   midodrine  10 mg Oral Q T,Th,Sa-HD   nutrition supplement (JUVEN)  1 packet Oral BID BM   pantoprazole  40 mg Oral Daily   pregabalin  100 mg Oral BID   rOPINIRole  2 mg Oral QHS   sevelamer carbonate  1,600 mg Oral TID WC    tamsulosin  0.4 mg Oral Daily   torsemide  80 mg Oral Once per day on Sun Mon Wed Fri   zinc sulfate  220 mg Oral Daily    Dialysis Orders: [facility]  4:15 450/800 112 kg 2.0 K/2.0 Ca AVF - No heparin  - mircera 100 mcg every 4 weeks (to be given on 10/28 - last outpatient) - hectorol 4 mcg every tx   Assessment/Plan: 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 Off abx at this time. PAD: Complicates wound healing ESRD: Continue HD on usual TTS schedule - next HD 11/7, likely as outpatient. BP/volume: Well above prior outpatient EDW despite amputation - suspect scale error, but will also try to maximize UF with each EDW. On torsemide on non-HD days and midodrine with HD prn. Reviewing outpatient records - was not reaching that dry weight, was leaving more 118kg range - see how low can get here and will adjust weight on d/c. 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 Secondary HPTH: CorrCa ok, Phos high - restarted home binder (Renvela 2/meals)  Nutrition: On protein supps for wound healing T2DM: Per primary Dispo: To SNF for rehab    Veneta Penton, Hershal Coria 07/15/2022, 9:41 AM  [employer]

## 2022-07-16 ENCOUNTER — Ambulatory Visit: Payer: Medicare HMO | Admitting: Orthopedic Surgery

## 2022-07-16 ENCOUNTER — Telehealth: Payer: Self-pay | Admitting: Student

## 2022-07-16 ENCOUNTER — Encounter (HOSPITAL_COMMUNITY): Payer: Self-pay

## 2022-07-16 DIAGNOSIS — K219 Gastro-esophageal reflux disease without esophagitis: Secondary | ICD-10-CM | POA: Diagnosis not present

## 2022-07-16 DIAGNOSIS — R52 Pain, unspecified: Secondary | ICD-10-CM | POA: Diagnosis not present

## 2022-07-16 DIAGNOSIS — E1151 Type 2 diabetes mellitus with diabetic peripheral angiopathy without gangrene: Secondary | ICD-10-CM

## 2022-07-16 DIAGNOSIS — E039 Hypothyroidism, unspecified: Secondary | ICD-10-CM | POA: Diagnosis not present

## 2022-07-16 DIAGNOSIS — H5201 Hypermetropia, right eye: Secondary | ICD-10-CM | POA: Diagnosis not present

## 2022-07-16 DIAGNOSIS — M1990 Unspecified osteoarthritis, unspecified site: Secondary | ICD-10-CM | POA: Diagnosis not present

## 2022-07-16 DIAGNOSIS — D509 Iron deficiency anemia, unspecified: Secondary | ICD-10-CM | POA: Diagnosis not present

## 2022-07-16 DIAGNOSIS — M8660 Other chronic osteomyelitis, unspecified site: Secondary | ICD-10-CM | POA: Diagnosis not present

## 2022-07-16 DIAGNOSIS — M549 Dorsalgia, unspecified: Secondary | ICD-10-CM | POA: Diagnosis not present

## 2022-07-16 DIAGNOSIS — Z741 Need for assistance with personal care: Secondary | ICD-10-CM | POA: Diagnosis not present

## 2022-07-16 DIAGNOSIS — I5032 Chronic diastolic (congestive) heart failure: Secondary | ICD-10-CM | POA: Diagnosis not present

## 2022-07-16 DIAGNOSIS — R0789 Other chest pain: Secondary | ICD-10-CM | POA: Diagnosis not present

## 2022-07-16 DIAGNOSIS — E1122 Type 2 diabetes mellitus with diabetic chronic kidney disease: Secondary | ICD-10-CM | POA: Diagnosis not present

## 2022-07-16 DIAGNOSIS — I739 Peripheral vascular disease, unspecified: Secondary | ICD-10-CM | POA: Diagnosis not present

## 2022-07-16 DIAGNOSIS — N319 Neuromuscular dysfunction of bladder, unspecified: Secondary | ICD-10-CM | POA: Diagnosis not present

## 2022-07-16 DIAGNOSIS — G8929 Other chronic pain: Secondary | ICD-10-CM | POA: Diagnosis not present

## 2022-07-16 DIAGNOSIS — E1140 Type 2 diabetes mellitus with diabetic neuropathy, unspecified: Secondary | ICD-10-CM | POA: Diagnosis not present

## 2022-07-16 DIAGNOSIS — Z89511 Acquired absence of right leg below knee: Secondary | ICD-10-CM | POA: Diagnosis not present

## 2022-07-16 DIAGNOSIS — Z89512 Acquired absence of left leg below knee: Secondary | ICD-10-CM | POA: Diagnosis not present

## 2022-07-16 DIAGNOSIS — E876 Hypokalemia: Secondary | ICD-10-CM | POA: Diagnosis not present

## 2022-07-16 DIAGNOSIS — Z4781 Encounter for orthopedic aftercare following surgical amputation: Secondary | ICD-10-CM | POA: Diagnosis not present

## 2022-07-16 DIAGNOSIS — E113511 Type 2 diabetes mellitus with proliferative diabetic retinopathy with macular edema, right eye: Secondary | ICD-10-CM | POA: Diagnosis not present

## 2022-07-16 DIAGNOSIS — E11311 Type 2 diabetes mellitus with unspecified diabetic retinopathy with macular edema: Secondary | ICD-10-CM | POA: Diagnosis not present

## 2022-07-16 DIAGNOSIS — Z130 Encounter for screening for diseases of the blood and blood-forming organs and certain disorders involving the immune mechanism: Secondary | ICD-10-CM | POA: Diagnosis not present

## 2022-07-16 DIAGNOSIS — E119 Type 2 diabetes mellitus without complications: Secondary | ICD-10-CM | POA: Diagnosis not present

## 2022-07-16 DIAGNOSIS — Z515 Encounter for palliative care: Secondary | ICD-10-CM | POA: Diagnosis not present

## 2022-07-16 DIAGNOSIS — L89309 Pressure ulcer of unspecified buttock, unspecified stage: Secondary | ICD-10-CM | POA: Diagnosis not present

## 2022-07-16 DIAGNOSIS — M86272 Subacute osteomyelitis, left ankle and foot: Secondary | ICD-10-CM | POA: Diagnosis not present

## 2022-07-16 DIAGNOSIS — Z6839 Body mass index (BMI) 39.0-39.9, adult: Secondary | ICD-10-CM | POA: Diagnosis not present

## 2022-07-16 DIAGNOSIS — Z743 Need for continuous supervision: Secondary | ICD-10-CM | POA: Diagnosis not present

## 2022-07-16 DIAGNOSIS — E113512 Type 2 diabetes mellitus with proliferative diabetic retinopathy with macular edema, left eye: Secondary | ICD-10-CM | POA: Diagnosis not present

## 2022-07-16 DIAGNOSIS — J449 Chronic obstructive pulmonary disease, unspecified: Secondary | ICD-10-CM | POA: Diagnosis not present

## 2022-07-16 DIAGNOSIS — M6281 Muscle weakness (generalized): Secondary | ICD-10-CM | POA: Diagnosis not present

## 2022-07-16 DIAGNOSIS — N2581 Secondary hyperparathyroidism of renal origin: Secondary | ICD-10-CM | POA: Diagnosis not present

## 2022-07-16 DIAGNOSIS — E1159 Type 2 diabetes mellitus with other circulatory complications: Secondary | ICD-10-CM | POA: Diagnosis not present

## 2022-07-16 DIAGNOSIS — I38 Endocarditis, valve unspecified: Secondary | ICD-10-CM | POA: Diagnosis not present

## 2022-07-16 DIAGNOSIS — D649 Anemia, unspecified: Secondary | ICD-10-CM | POA: Diagnosis not present

## 2022-07-16 DIAGNOSIS — G43909 Migraine, unspecified, not intractable, without status migrainosus: Secondary | ICD-10-CM | POA: Diagnosis not present

## 2022-07-16 DIAGNOSIS — E785 Hyperlipidemia, unspecified: Secondary | ICD-10-CM | POA: Diagnosis not present

## 2022-07-16 DIAGNOSIS — I959 Hypotension, unspecified: Secondary | ICD-10-CM | POA: Diagnosis not present

## 2022-07-16 DIAGNOSIS — L03116 Cellulitis of left lower limb: Secondary | ICD-10-CM | POA: Diagnosis not present

## 2022-07-16 DIAGNOSIS — N186 End stage renal disease: Secondary | ICD-10-CM | POA: Diagnosis not present

## 2022-07-16 DIAGNOSIS — Z992 Dependence on renal dialysis: Secondary | ICD-10-CM | POA: Diagnosis not present

## 2022-07-16 DIAGNOSIS — I132 Hypertensive heart and chronic kidney disease with heart failure and with stage 5 chronic kidney disease, or end stage renal disease: Secondary | ICD-10-CM | POA: Diagnosis not present

## 2022-07-16 DIAGNOSIS — I209 Angina pectoris, unspecified: Secondary | ICD-10-CM | POA: Diagnosis not present

## 2022-07-16 DIAGNOSIS — I10 Essential (primary) hypertension: Secondary | ICD-10-CM | POA: Diagnosis not present

## 2022-07-16 DIAGNOSIS — R531 Weakness: Secondary | ICD-10-CM | POA: Diagnosis not present

## 2022-07-16 DIAGNOSIS — H4311 Vitreous hemorrhage, right eye: Secondary | ICD-10-CM | POA: Diagnosis not present

## 2022-07-16 DIAGNOSIS — E875 Hyperkalemia: Secondary | ICD-10-CM | POA: Diagnosis not present

## 2022-07-16 DIAGNOSIS — Z7401 Bed confinement status: Secondary | ICD-10-CM | POA: Diagnosis not present

## 2022-07-16 DIAGNOSIS — G629 Polyneuropathy, unspecified: Secondary | ICD-10-CM | POA: Diagnosis not present

## 2022-07-16 DIAGNOSIS — R112 Nausea with vomiting, unspecified: Secondary | ICD-10-CM | POA: Diagnosis not present

## 2022-07-16 DIAGNOSIS — I120 Hypertensive chronic kidney disease with stage 5 chronic kidney disease or end stage renal disease: Secondary | ICD-10-CM | POA: Diagnosis not present

## 2022-07-16 DIAGNOSIS — D631 Anemia in chronic kidney disease: Secondary | ICD-10-CM | POA: Diagnosis not present

## 2022-07-16 DIAGNOSIS — R079 Chest pain, unspecified: Secondary | ICD-10-CM | POA: Diagnosis not present

## 2022-07-16 DIAGNOSIS — I482 Chronic atrial fibrillation, unspecified: Secondary | ICD-10-CM | POA: Diagnosis not present

## 2022-07-16 DIAGNOSIS — R2681 Unsteadiness on feet: Secondary | ICD-10-CM | POA: Diagnosis not present

## 2022-07-16 DIAGNOSIS — Z13228 Encounter for screening for other metabolic disorders: Secondary | ICD-10-CM | POA: Diagnosis not present

## 2022-07-16 DIAGNOSIS — Z23 Encounter for immunization: Secondary | ICD-10-CM | POA: Diagnosis not present

## 2022-07-16 DIAGNOSIS — N250 Renal osteodystrophy: Secondary | ICD-10-CM | POA: Diagnosis not present

## 2022-07-16 LAB — GLUCOSE, CAPILLARY
Glucose-Capillary: 195 mg/dL — ABNORMAL HIGH (ref 70–99)
Glucose-Capillary: 207 mg/dL — ABNORMAL HIGH (ref 70–99)
Glucose-Capillary: 180 mg/dL — ABNORMAL HIGH (ref 70–99)
Glucose-Capillary: 221 mg/dL — ABNORMAL HIGH (ref 70–99)

## 2022-07-16 MED ORDER — CHLORHEXIDINE GLUCONATE CLOTH 2 % EX PADS
6.0000 | MEDICATED_PAD | Freq: Every day | CUTANEOUS | Status: DC
  Administered 2022-07-16: 6 via TOPICAL

## 2022-07-16 MED ORDER — OXYCODONE HCL 5 MG PO TABS
10.0000 mg | ORAL_TABLET | ORAL | Status: DC | PRN
  Administered 2022-07-16: 10 mg via ORAL

## 2022-07-16 NOTE — Telephone Encounter (Signed)
Palliative NP received call from patient's wife. Patient will be discharging from hospital to [facility] and would like for palliative services to continue. Palliative admin notified to request order to continue palliative services.

## 2022-07-16 NOTE — Progress Notes (Signed)
Patient with prophylactic sacral dressing, skin peeled out at the left side with bleeding when removing the old dressing. New dressing done with xeroform, guaze and foam dressing. Placed an order for wound consult.

## 2022-07-16 NOTE — Progress Notes (Addendum)
Contacted [place] to advise clinic that insurance Josem Kaufmann is pending for snf. Clinic advised that pt may d/c later today if Josem Kaufmann is received this afternoon so pt may or may not be at clinic tomorrow. Will provide update to clinic once confirmed.   Melven Sartorius Renal Navigator [phone]  Addendum at 3:10 pm: Advised by CSW that pt will d/c to snf this afternoon. Contacted [place] and spoke to Gene Autry, Therapist, sports. Clinic advised pt will d/c today and resume care tomorrow.

## 2022-07-16 NOTE — TOC Transition Note (Signed)
Transition of Care [facility]) - CM/SW Discharge Note   Patient Details  Name: [name] MRN: [id number] Date of Birth: 1955/11/05  [facility] CM/SW Contact:  Joanne Chars, LCSW Phone Number: 07/16/2022, 3:27 PM   Clinical Narrative:   Pt discharging to [facility].  RN call report to [phone].     Final next level of care: Skilled Nursing Facility Barriers to Discharge: Barriers Resolved   Patient Goals and CMS Choice Patient states their goals for this hospitalization and ongoing recovery are:: walk again      Discharge Placement              Patient chooses bed at:  [facility]) Patient to be transferred to facility by: [place] Name of family member notified: wife [name]--pt called her while CSW in room, no questions/concerns Patient and family notified of of transfer: 07/16/22  Discharge Plan and Services In-house Referral: Clinical Social Work   Post Acute Care Choice: [place]                               Social Determinants of Health (SDOH) Interventions     Readmission Risk Interventions    06/30/2021   11:53 AM  Readmission Risk Prevention Plan  Transportation Screening Complete  Medication Review Press photographer) Complete  HRI or [place] Complete  SW Recovery Care/Counseling Consult Complete  [place] Not Applicable

## 2022-07-16 NOTE — Discharge Summary (Signed)
Discharge Diagnoses:  Principal Problem:   S/P BKA (below knee amputation) unilateral, left (HCC) Active Problems:   Subacute osteomyelitis, left ankle and foot (HCC)   Chest wall pain   Surgeries: Procedure(s): LEFT BELOW KNEE AMPUTATION on 07/04/2022    Consultants: Treatment Team:  Claudia Desanctis, MD Roney Jaffe, MD  Discharged Condition: Improved  Hospital Course: [name] is an 66 y.o. male who was admitted 07/03/2022 with a chief complaint of osteomyelitis left foot, with a final diagnosis of Osteomyelitis Left Foot.  Patient was brought to the operating room on 07/04/2022 and underwent Procedure(s): LEFT BELOW KNEE AMPUTATION.    Patient was given perioperative antibiotics:  Anti-infectives (From admission, onward)    Start     Dose/Rate Route Frequency Ordered Stop   07/05/22 1200  ceFAZolin (ANCEF) IVPB  Status:  Discontinued       Note to Pharmacy: Indication:  GBS bacteremia  First Dose: Yes Last Day of Therapy:  07/23/22 Labs - Once weekly:  CBC/D and BMP, Labs - Every other week:  ESR and CRP Method of administration: IV Push Method of administration may be changed at the discretion of home infusion pharmacist based upon assessment of   2 g Intravenous Every T-Th-Sa (Hemodialysis) 07/04/22 1137 07/04/22 1426   07/05/22 1200  gentamicin (GARAMYCIN) IVPB  Status:  Discontinued       Note to Pharmacy: Indication:  GBS AV/MV IE - synergy Last Day of Therapy:  07/12/22 Labs - _0 0 mg 200 mL/hr over 30 Minutes Intravenous Every T-Th-Sa (Hemodialysis) 07/04/22 1429 07/12/22 2132   07/04/22 1800  ceFAZolin (ANCEF) IVPB 1 g/50 mL premix        1 g 100 mL/hr over 30 Minutes Intravenous Every 24 hours 07/03/22 1956 07/04/22 1913   07/03/22 1800  ceFAZolin (ANCEF) IVPB 2g/100 mL premix  Status:  Discontinued        2 g 200 mL/hr over 30 Minutes Intravenous Every 6 hours 07/03/22 1755 07/03/22 1956     .  Patient was given sequential compression devices, early ambulation, and aspirin for DVT prophylaxis.  Recent vital signs: Patient Vitals for the past 24 hrs:  BP Temp Temp src Pulse Resp SpO2  07/16/22 0716 (!) 110/57 98.5 F (36.9 C) -- 71 18 100 %  07/15/22 2136 124/66 98.7 F (37.1 C) Oral 82 18 100 %  07/15/22 1821 (!) 111/57 98.4 F (36.9 C) Oral -- 16 95 %  .  Recent laboratory studies: No results found.  Discharge Medications:   Allergies as of 07/16/2022       Reactions   Contrast Media [iodinated Contrast Media] Shortness Of Breath, Other (See Comments)   Difficulty breathing and altered mental status   Ivp Dye [iodinated Contrast Media] Anaphylaxis, Shortness Of Breath, Other (See Comments)   Breathing problems, altered mental state   Adhesive [tape] Rash, Other (See Comments)   Rash after 1  day of use Do not leave on longer then 3 days with out be changed   Latex Rash, Other (See Comments)   A severe rash appears after the first 24 hours of being placed        Medication List     STOP taking these medications    silver sulfADIAZINE 1 % cream Commonly known as: SILVADENE       TAKE these medications    amiodarone 200 MG tablet Commonly known as: PACERONE Take 0.5 tablets (100 mg total) by mouth daily.   amLODipine 5 MG tablet Commonly known as: NORVASC Take 5 mg by mouth daily as needed for dizziness. Take one tablet for BP > 160 mm Hg   apixaban 5 MG Tabs tablet Commonly known  as: ELIQUIS Take 1 tablet (5 mg total) by mouth 2 (two) times daily.   atorvastatin 40 MG tablet Commonly known as: LIPITOR Take 40 mg by mouth at bedtime.   ceFAZolin  IVPB Commonly known as: ANCEF Inject 2 g into the vein Every Tuesday,Thursday,and Saturday with dialysis for 25 days. Indication:  GBS bacteremia  First Dose: Yes Last Day of Therapy:  07/23/22 Labs - Once weekly:  CBC/D and BMP, Labs - Every other week:  ESR and CRP Method of administration: IV Push Method of administration may be changed at the discretion of home infusion pharmacist based upon assessment of the patient and/or caregiver's ability to self-administer the medication ordered.   Dialyvite/Zinc Tabs Take 1 tablet by mouth Every Tuesday,Thursday,and Saturday with dialysis.   famotidine 20 MG tablet Commonly known as: PEPCID Take 20 mg by mouth every other day.   fentaNYL 50 MCG/HR Commonly known as: [place] 1 patch onto the skin every 3 (three) days.   folic acid 1 MG tablet Commonly known as: FOLVITE Take 1 mg by mouth daily.   GLUCOSAMINE PO Take 1 tablet by mouth 2 (two) times daily.   insulin glargine 100 UNIT/ML injection Commonly known as: LANTUS Inject 0.2 mLs (20 Units total) into the skin at bedtime. What changed: how much to take   insulin lispro 100 UNIT/ML injection Commonly known as: HUMALOG Inject 4-13 Units into the skin 3 (three) times daily before meals. Only with meals if needed   ketoconazole 2 % cream Commonly known as: NIZORAL Apply 1 application topically 2 (two) times daily as needed for irritation.   lidocaine-prilocaine cream Commonly known as: EMLA Apply 1 application topically See admin instructions. Prior to Dialysis days Tuesday,Thursday and saturday   loratadine 10 MG tablet Commonly known as: CLARITIN Take 10 mg by mouth daily.   metoCLOPramide 5 MG tablet Commonly known as: Reglan Take 1 tablet (5 mg total) by mouth 4 (four) times daily -   before meals and at bedtime. What changed: when to take this   midodrine 10 MG tablet Commonly known as: PROAMATINE Take 1 tablet (10 mg total) by mouth every other day. One hour before dialysis What changed: when to take this   NEEDLE (REUSABLE) 22 G 22G X 1-1/2" Misc 1 Units by Does not apply route as directed. For B12 IM inj   nitroGLYCERIN 0.4 MG SL tablet Commonly known as: NITROSTAT Place 0.4 mg under the tongue every 5 (five) minutes as needed for chest pain. What changed: Another medication with the same name was removed. Continue taking this medication, and follow the directions you see here.   NovoLOG FlexPen 100 UNIT/ML FlexPen Generic drug: insulin aspart Inject 4 Units into the  skin 3 (three) times daily.   omega-3 acid ethyl esters 1 g capsule Commonly known as: LOVAZA Take 2 g by mouth 2 (two) times daily. Mini   ondansetron 4 MG disintegrating tablet Commonly known as: ZOFRAN-ODT Take 1 tablet by mouth 4 (four) times daily as needed for nausea/vomiting.   oxyCODONE-acetaminophen 7.5-325 MG tablet Commonly known as: Percocet Take 1 tablet by mouth every 4 (four) hours as needed for severe pain. What changed:  when to take this reasons to take this   pantoprazole 40 MG tablet Commonly known as: PROTONIX Take 1 tablet (40 mg total) by mouth 2 (two) times daily for 30 days, THEN 1 tablet (40 mg total) daily. Start taking on: Jan 14, 2022 What changed: See the new instructions.   Polyvinyl Alcohol-Povidone PF 1.4-0.6 % Soln Place 1 drop into both eyes 2 (two) times daily as needed (dry eyes).   pregabalin 100 MG capsule Commonly known as: LYRICA Take 100 mg by mouth 2 (two) times daily.   rOPINIRole 2 MG tablet Commonly known as: REQUIP Take 2 mg by mouth at bedtime.   SYRINGE 3CC/22GX1-1/2" 22G X 1-1/2" 3 ML Misc 1 Syringe by Does not apply route as directed. For b12 IM inj   tamsulosin 0.4 MG Caps capsule Commonly known as: FLOMAX Take 0.4 mg by  mouth daily.       ASK your doctor about these medications    gentamicin  IVPB Commonly known as: GARAMYCIN Inject 100 mg into the vein Every Tuesday,Thursday,and Saturday with dialysis for 14 days. Indication:  GBS AV/MV IE - synergy Last Day of Therapy:  07/12/22 Labs - Sunday/Monday:  CBC/D, BMP, and gentamicin trough. Labs - Thursday:  BMP and gentamicin trough Labs - Every other week:  ESR and CRP Method of administration: Elastomeric Method of administration may be changed at the discretion of home infusion pharmacist based upon assessment of the patient and/or caregiver's ability to self-administer the medication ordered. Ask about: Should I take this medication?        Diagnostic Studies: CT ABDOMEN PELVIS WO CONTRAST  Result Date: 07/03/2022 CLINICAL DATA:  Left leg wound, sepsis, chest pain, epigastric pain EXAM: CT ABDOMEN AND PELVIS WITHOUT CONTRAST TECHNIQUE: Multidetector CT imaging of the abdomen and pelvis was performed following the standard protocol without IV contrast. Unenhanced CT was performed per clinician order. Lack of IV contrast limits sensitivity and specificity, especially for evaluation of abdominal/pelvic solid viscera. RADIATION DOSE REDUCTION: This exam was performed according to the departmental dose-optimization program which includes automated exposure control, adjustment of the mA and/or kV according to patient size and/or use of iterative reconstruction technique. COMPARISON:  06/23/2022, 06/13/2021 FINDINGS: Lower chest: No acute pleural or parenchymal lung disease. Hepatobiliary: Cholecystectomy. Unremarkable unenhanced appearance of the liver. No biliary duct dilation. Pancreas: Stable 1.7 cm cystic region within the pancreatic body reference image 35/3, unchanged since 2020. Otherwise unremarkable unenhanced appearance. Spleen: Unremarkable unenhanced appearance. Adrenals/Urinary Tract: Bilateral renal atrophy consistent with end-stage renal disease.  No acute renal abnormalities. No urinary tract calculi or obstructive uropathy. The adrenals and bladder are unremarkable. Stomach/Bowel: No bowel obstruction or ileus. Postsurgical changes from prior sigmoid colon resection and reanastomosis. Normal appendix right lower quadrant. Diverticulosis of the descending colon without evidence of acute diverticulitis. No bowel wall thickening or inflammatory change. Vascular/Lymphatic: Diffuse atherosclerosis of the aorta and its branches. No pathologic adenopathy within the abdomen or pelvis. Reactive inguinal lymph nodes are again seen, left greater than right, decreased in prominence since  prior study and likely related to the known left lower extremity ulceration and chronic left foot osteomyelitis described previously. Reproductive: Prostate is unremarkable. Other: No free fluid or free intraperitoneal gas. Small fat containing supraumbilical ventral hernia unchanged. Postsurgical changes from prior colostomy takedown in the right lower quadrant. No bowel herniation. Musculoskeletal: No acute or destructive bony lesions. Stable postsurgical changes spanning L4 through S1. IMPRESSION: 1. No acute intra-abdominal or intrapelvic process. 2. Descending colonic diverticulosis without diverticulitis. 3. Persistent but decreased reactive lymph nodes primarily in the left inguinal region, related to the patient's known chronic left lower extremity ulceration and osteomyelitis. 4. 1.7 cm cyst within the pancreatic body, unchanged since 2022. If not previously evaluated, when the patient is clinically stable and able to follow directions and hold their breath (preferably as an outpatient) further evaluation with dedicated abdominal MRI should be considered. 5.  Aortic Atherosclerosis (ICD10-I70.0). Electronically Signed   By: Randa Ngo M.D.   On: 07/03/2022 16:33   DG Chest 2 View  Result Date: 07/03/2022 CLINICAL DATA:  Chest and abdominal pain EXAM: CHEST - 2 VIEW  COMPARISON:  06/23/2022 FINDINGS: Cardiomegaly. Both lungs are clear. Disc degenerative disease of the thoracic spine. IMPRESSION: Cardiomegaly without acute abnormality of the lungs. Electronically Signed   By: Delanna Ahmadi M.D.   On: 07/03/2022 13:20   ECHOCARDIOGRAM COMPLETE  Result Date: 06/27/2022    ECHOCARDIOGRAM REPORT   Patient Name:   [name] Date of Exam: 06/25/2022 Medical Rec #:  [id number]     Height:       74.0 in Accession #:    [id number]    Weight:       277.1 lb Date of Birth:  05/04/1956     BSA:          2.497 m Patient Age:    41 years      BP:           131/59 mmHg Patient Gender: M             HR:           74 bpm. Exam Location:  Inpatient Procedure: 2D Echo, Cardiac Doppler, Color Doppler and Intracardiac            Opacification Agent Indications:     Bacteremia  History:         Patient has prior history of Echocardiogram examinations. CAD,                  PAD; Risk Factors:Hypertension and Diabetes.  Sonographer:     Meagan Baucom RDCS, FE, PE Referring Phys:  White Diagnosing Phys: Adrian Prows MD IMPRESSIONS  1. Left ventricular ejection fraction, by estimation, is 55 to 60%. Left ventricular ejection fraction by PLAX is 63 %. The left ventricle has normal function. The left ventricle has no regional wall motion abnormalities. There is mild left ventricular hypertrophy. Left ventricular diastolic parameters are consistent with Grade II diastolic dysfunction (pseudonormalization). Elevated left ventricular end-diastolic pressure.  2. Right ventricular systolic function is normal. The right ventricular size is normal.  3. Left atrial size was mildly dilated.  4. The mitral valve is degenerative. Trivial mitral valve regurgitation. No evidence of mitral stenosis.  5. The aortic valve is tricuspid. There is mild calcification of the aortic valve. There is mild thickening of the aortic valve. Aortic valve regurgitation is not visualized. Mild aortic valve stenosis.  Comparison(s): No significant change from 06/17/2021, valvular calcification was  noted previously. Clinical correlation recommended. Conclusion(s)/Recommendation(s): Findings consistent with hypertensive heart disease. Findings concerning for Moderate AV and MV calcification, vegetations cannot be completely excluded. There appearas to be mobile mass on both leaflets, most probably related to calcification and reverbation, how ever vegetations cannot be excluded, would recommend Transesophageal Echocardiogram for clarification. FINDINGS  Left Ventricle: Left ventricular ejection fraction, by estimation, is 55 to 60%. Left ventricular ejection fraction by PLAX is 63 %. The left ventricle has normal function. The left ventricle has no regional wall motion abnormalities. Definity contrast agent was given IV to delineate the left ventricular endocardial borders. The left ventricular internal cavity size was normal in size. There is mild left ventricular hypertrophy. Left ventricular diastolic parameters are consistent with Grade II diastolic dysfunction (pseudonormalization). Elevated left ventricular end-diastolic pressure. Right Ventricle: The right ventricular size is normal. No increase in right ventricular wall thickness. Right ventricular systolic function is normal. Left Atrium: Left atrial size was mildly dilated. Right Atrium: Right atrial size was normal in size. Pericardium: There is no evidence of pericardial effusion. Mitral Valve: The mitral valve is degenerative in appearance. There is moderate thickening of the mitral valve leaflet(s). There is moderate calcification of the mitral valve leaflet(s). Normal mobility of the mitral valve leaflets. Mild mitral annular calcification. Trivial mitral valve regurgitation. No evidence of mitral valve stenosis. Tricuspid Valve: The tricuspid valve is normal in structure. Tricuspid valve regurgitation is not demonstrated. No evidence of tricuspid stenosis. Aortic  Valve: The aortic valve is tricuspid. There is mild calcification of the aortic valve. There is mild thickening of the aortic valve. Aortic valve regurgitation is not visualized. Mild aortic stenosis is present. Pulmonic Valve: The pulmonic valve was grossly normal. Pulmonic valve regurgitation is not visualized. Aorta: The aortic root is normal in size and structure. IAS/Shunts: No atrial level shunt detected by color flow Doppler.  LEFT VENTRICLE PLAX 2D LV EF:         Left            Diastology                ventricular     LV e' medial:    5.55 cm/s                ejection        LV E/e' medial:  19.3                fraction by     LV e' lateral:   9.46 cm/s                PLAX is 63      LV E/e' lateral: 11.3                %. LVIDd:         4.70 cm LVIDs:         3.10 cm LV PW:         1.20 cm LV IVS:        1.20 cm LVOT diam:     2.50 cm LV SV:         115 LV SV Index:   46 LVOT Area:     4.91 cm  RIGHT VENTRICLE RV S prime:     11.40 cm/s TAPSE (M-mode): 1.6 cm LEFT ATRIUM             Index        RIGHT ATRIUM  Index LA diam:        4.10 cm 1.64 cm/m   RA Area:     18.40 cm LA Vol (A2C):   65.4 ml 26.20 ml/m  RA Volume:   48.00 ml  19.23 ml/m LA Vol (A4C):   83.0 ml 33.25 ml/m LA Biplane Vol: 80.3 ml 32.16 ml/m  AORTIC VALVE LVOT Vmax:   104.00 cm/s LVOT Vmean:  68.700 cm/s LVOT VTI:    0.234 m  AORTA Ao Root diam: 4.10 cm Ao Asc diam:  3.80 cm MITRAL VALVE MV Area (PHT): 2.61 cm     SHUNTS MV Decel Time: 291 msec     Systemic VTI:  0.23 m MV E velocity: 107.00 cm/s  Systemic Diam: 2.50 cm MV A velocity: 123.00 cm/s MV E/A ratio:  0.87 Adrian Prows MD Electronically signed by Adrian Prows MD Signature Date/Time: 06/27/2022/8:50:54 PM    Final    CT CHEST ABDOMEN PELVIS WO CONTRAST  Result Date: 06/23/2022 CLINICAL DATA:  Left foot osteomyelitis. Clinical concern for sepsis. Diabetes. EXAM: CT CHEST, ABDOMEN AND PELVIS WITHOUT CONTRAST TECHNIQUE: Multidetector CT imaging of the chest, abdomen  and pelvis was performed following the standard protocol without IV contrast. RADIATION DOSE REDUCTION: This exam was performed according to the departmental dose-optimization program which includes automated exposure control, adjustment of the mA and/or kV according to patient size and/or use of iterative reconstruction technique. COMPARISON:  None Available. FINDINGS: CT CHEST FINDINGS Cardiovascular: Enlarged heart. Atheromatous calcifications, including the coronary arteries and aorta. Mediastinum/Nodes: No enlarged mediastinal, hilar, or axillary lymph nodes. Thyroid gland, trachea, and esophagus demonstrate no significant findings. Lungs/Pleura: Mild bibasilar dependent atelectasis. 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 Lower cervical spine fixation hardware. CT ABDOMEN PELVIS FINDINGS Hepatobiliary: No focal liver abnormality is seen. Status post cholecystectomy. No biliary dilatation. Pancreas: Mild pancreatic atrophy. Spleen: Normal in size without focal abnormality. Adrenals/Urinary Tract: Normal appearing adrenal glands. Stable small exophytic right renal cyst. Unremarkable left kidney, ureters and urinary bladder. Stomach/Bowel: Descending and sigmoid colon diverticula. Mid sigmoid colon anastomosis. Anastomosis involving a dilated distal small bowel loop containing fecalized material. Small appendicoliths in the proximal appendix without evidence of appendicitis. Unremarkable stomach. Vascular/Lymphatic: Atheromatous arterial calcifications without aneurysm. Multiple mildly enlarged left inguinal lymph nodes with central low density. The largest has a short axis diameter of 13 mm on image number 129/3, previously 9 mm. Reproductive: Prostate is unremarkable. Other: Small bilateral inguinal hernias containing fat. 3 supraumbilical ventral hernias containing fat. Midline infraumbilical scar and right lower quadrant anterior scar. Musculoskeletal: Interbody and pedicle  screw and rod fusion at the L4 through S1 levels. Lumbar spine degenerative changes. IMPRESSION: 1. Multiple mildly enlarged left inguinal lymph nodes with central low density. This is nonspecific with differential considerations including adenitis, reactive adenopathy and metastatic adenopathy. 2. No acute abnormality in the abdomen or pelvis. 3. Cardiomegaly. 4.  Calcific coronary artery and aortic atherosclerosis. 5. Colonic diverticulosis. 6. Multiple ventral hernias containing fat. Aortic Atherosclerosis (ICD10-I70.0). Electronically Signed   By: Claudie Revering M.D.   On: 06/23/2022 17:51   CT Foot Left Wo Contrast  Result Date: 06/23/2022 CLINICAL DATA:  Foot ulcer, concern for osteomyelitis EXAM: CT OF THE LEFT FOOT WITHOUT CONTRAST TECHNIQUE: Multidetector CT imaging of the left foot was performed according to the standard protocol. Multiplanar CT image reconstructions were also generated. RADIATION DOSE REDUCTION: This exam was performed according to the departmental dose-optimization program which includes automated exposure control, adjustment of the mA and/or  kV according to patient size and/or use of iterative reconstruction technique. COMPARISON:  X-ray 06/23/2022, MRI 01/12/2022 FINDINGS: Bones/Joint/Cartilage Bones are diffusely demineralized. 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 Cortical thickening and sclerosis of the third and fourth metatarsal bases and to a lesser degree within the cuboid compatible with sequela of chronic osteomyelitis. No evidence of a new erosion or aggressive periosteal reaction. No fracture or dislocation. Similar degenerative changes of the foot. No tibiotalar or subtalar joint effusions. Ligaments Suboptimally assessed by CT. Muscles and Tendons Denervation and amputation changes of the musculotendinous structures of the foot. No acute findings. No appreciable tenosynovial fluid collection. Soft tissues Shallow soft tissue ulceration at the lateral  aspect of the hindfoot in the region of the calcaneocuboid joint. Soft tissue edema at the lateral and dorsal aspects of the foot. No organized fluid collection. No soft tissue gas. IMPRESSION: 1. Shallow soft tissue ulceration at the lateral aspect of the hindfoot in the region of the calcaneocuboid joint with adjacent cellulitis. 2. Findings of chronic osteomyelitis involving the third and fourth metatarsal bases and adjacent cuboid. No evidence of a new erosion or aggressive periosteal reaction to suggest acute osteomyelitis by CT. Electronically Signed   By: Davina Poke D.O.   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 No recent fracture or dislocation is seen. Osteopenia is seen in bony structures. No focal lytic lesions are seen. Plantar spur is seen in calcaneus. Degenerative changes are noted in the intertarsal and tarsometatarsal joints. Arterial calcifications are seen in soft tissues. Overall, no significant interval changes are noted. IMPRESSION: No fracture or dislocation is seen. Postsurgical changes as described in the body of the report. There are no focal lytic lesions. If there is clinical suspicion for osteomyelitis, follow-up MRI may be considered. Electronically Signed   By: Elmer Picker M.D.   On: 06/23/2022 15:47   DG Chest 2 View  Result Date: 06/23/2022 CLINICAL DATA:  Suspected sepsis EXAM: CHEST - 2 VIEW COMPARISON:  01/11/2022 FINDINGS: The heart size and mediastinal contours are within normal limits. Both lungs are clear. The visualized skeletal structures are unremarkable. IMPRESSION: No active cardiopulmonary disease. Electronically Signed   By: Nelson Chimes M.D.   On: 06/23/2022 15:44    Patient benefited maximally from their hospital stay and there were no  complications.     Disposition: Discharge disposition: [facility]      Discharge Instructions     Call MD / Call 911   Complete by: As directed    If you experience chest pain or shortness of breath, CALL 911 and be transported to the hospital emergency room.  If you develope a fever above 101 F, pus (white drainage) or increased drainage or redness at the wound, or calf pain, call your surgeon's office.   Call MD / Call 911   Complete by: As directed    If you experience chest pain or shortness of breath, CALL 911 and be transported to the hospital emergency room.  If you develope a fever above 101 F, pus (white drainage) or increased drainage or redness at the wound, or calf pain, call your surgeon's office.   Call MD / Call 911   Complete by: As directed    If you experience chest pain or shortness  of breath, CALL 911 and be transported to the hospital emergency room.  If you develope a fever above 101 F, pus (white drainage) or increased drainage or redness at the wound, or calf pain, call your surgeon's office.   Constipation Prevention   Complete by: As directed    Drink plenty of fluids.  Prune juice may be helpful.  You may use a stool softener, such as Colace (over the counter) 100 mg twice a day.  Use MiraLax (over the counter) for constipation as needed.   Constipation Prevention   Complete by: As directed    Drink plenty of fluids.  Prune juice may be helpful.  You may use a stool softener, such as Colace (over the counter) 100 mg twice a day.  Use MiraLax (over the counter) for constipation as needed.   Constipation Prevention   Complete by: As directed    Drink plenty of fluids.  Prune juice may be helpful.  You may use a stool softener, such as Colace (over the counter) 100 mg twice a day.  Use MiraLax (over the counter) for constipation as needed.   Diet - low sodium heart healthy   Complete by: As directed    Diet - low sodium heart healthy   Complete by: As  directed    Diet - low sodium heart healthy   Complete by: As directed    Increase activity slowly as tolerated   Complete by: As directed    Increase activity slowly as tolerated   Complete by: As directed    Increase activity slowly as tolerated   Complete by: As directed    Negative Pressure Wound Therapy - Incisional   Complete by: As directed    Attach wound VAC dressing to the Praveena plus portable wound VAC pump for discharge.   Post-operative opioid taper instructions:   Complete by: As directed    POST-OPERATIVE OPIOID TAPER INSTRUCTIONS: It is important to wean off of your opioid medication as soon as possible. If you do not need pain medication after your surgery it is ok to stop day one. Opioids include: Codeine, Hydrocodone(Norco, Vicodin), Oxycodone(Percocet, oxycontin) and hydromorphone amongst others.  Long term and even short term use of opiods can cause: Increased pain response Dependence Constipation Depression Respiratory depression And more.  Withdrawal symptoms can include Flu like symptoms Nausea, vomiting And more Techniques to manage these symptoms Hydrate well Eat regular healthy meals Stay active Use relaxation techniques(deep breathing, meditating, yoga) Do Not substitute Alcohol to help with tapering If you have been on opioids for less than two weeks and do not have pain than it is ok to stop all together.  Plan to wean off of opioids This plan should start within one week post op of your joint replacement. Maintain the same interval or time between taking each dose and first decrease the dose.  Cut the total daily intake of opioids by one tablet each day Next start to increase the time between doses. The last dose that should be eliminated is the evening dose.      Post-operative opioid taper instructions:   Complete by: As directed    POST-OPERATIVE OPIOID TAPER INSTRUCTIONS: It is important to wean off of your opioid medication as soon  as possible. If you do not need pain medication after your surgery it is ok to stop day one. Opioids include: Codeine, Hydrocodone(Norco, Vicodin), Oxycodone(Percocet, oxycontin) and hydromorphone amongst others.  Long term and even short term use of opiods can  cause: Increased pain response Dependence Constipation Depression Respiratory depression And more.  Withdrawal symptoms can include Flu like symptoms Nausea, vomiting And more Techniques to manage these symptoms Hydrate well Eat regular healthy meals Stay active Use relaxation techniques(deep breathing, meditating, yoga) Do Not substitute Alcohol to help with tapering If you have been on opioids for less than two weeks and do not have pain than it is ok to stop all together.  Plan to wean off of opioids This plan should start within one week post op of your joint replacement. Maintain the same interval or time between taking each dose and first decrease the dose.  Cut the total daily intake of opioids by one tablet each day Next start to increase the time between doses. The last dose that should be eliminated is the evening dose.      Post-operative opioid taper instructions:   Complete by: As directed    POST-OPERATIVE OPIOID TAPER INSTRUCTIONS: It is important to wean off of your opioid medication as soon as possible. If you do not need pain medication after your surgery it is ok to stop day one. Opioids include: Codeine, Hydrocodone(Norco, Vicodin), Oxycodone(Percocet, oxycontin) and hydromorphone amongst others.  Long term and even short term use of opiods can cause: Increased pain response Dependence Constipation Depression Respiratory depression And more.  Withdrawal symptoms can include Flu like symptoms Nausea, vomiting And more Techniques to manage these symptoms Hydrate well Eat regular healthy meals Stay active Use relaxation techniques(deep breathing, meditating, yoga) Do Not substitute Alcohol to  help with tapering If you have been on opioids for less than two weeks and do not have pain than it is ok to stop all together.  Plan to wean off of opioids This plan should start within one week post op of your joint replacement. Maintain the same interval or time between taking each dose and first decrease the dose.  Cut the total daily intake of opioids by one tablet each day Next start to increase the time between doses. The last dose that should be eliminated is the evening dose.          Contact information for follow-up providers     Newt Minion, MD Follow up in 1 week(s).   Specialty: Orthopedic Surgery Contact information: [place] [address] [phone]              Contact information for after-discharge care     [facility] .   Service: Skilled Nursing Contact information: [place] [phone]                      Signed: Newt Minion 07/16/2022, 8:03 AM

## 2022-07-16 NOTE — Progress Notes (Signed)
[facility] Progress Note   Subjective:   Leg hurting, says he just got his pain meds. Denies SOB, CP, dizziness and nausea. Reports HD is going well here but had been going very poorly outpatient, worse when goal >5L. Hoping to d/c to SNF soon- pending insurance approval.   Objective Vitals:   07/15/22 0720 07/15/22 1821 07/15/22 2136 07/16/22 0716  BP: (!) 109/59 (!) 111/57 124/66 (!) 110/57  Pulse: 70  82 71  Resp: '18 16 18 18  '$ Temp: 98.7 F (37.1 C) 98.4 F (36.9 C) 98.7 F (37.1 C) 98.5 F (36.9 C)  TempSrc: Oral Oral Oral   SpO2: 96% 95% 100% 100%  Weight:      Height:       Physical Exam General: Alert male in NAD Heart: RRR, no murmurs, rubs or gallops  Lungs: CTA bilaterally without wheezing, rhonchi or rales Abdomen: Soft, non-distended, +BS Extremities: L BKA in brace, no edema R stump Dialysis Access:  LUE AVF + Bruit  Additional Objective Labs: Basic Metabolic Panel: Recent Labs  Lab 07/14/22 0803  NA 130*  K 4.5  CL 90*  CO2 24  GLUCOSE 193*  BUN 76*  CREATININE 8.68*  CALCIUM 8.5*  PHOS 6.7*   Liver Function Tests: Recent Labs  Lab 07/14/22 0803  ALBUMIN 2.4*   No results for input(s): "LIPASE", "AMYLASE" in the last 168 hours. CBC: Recent Labs  Lab 07/14/22 0803  WBC 7.0  HGB 8.9*  HCT 26.6*  MCV 89.9  PLT 276   Blood Culture    Component Value Date/Time   SDES BLOOD RIGHT FOREARM 06/25/2022 1258   SPECREQUEST  06/25/2022 1258    BOTTLES DRAWN AEROBIC AND ANAEROBIC Blood Culture adequate volume   CULT  06/25/2022 1258    NO GROWTH 5 DAYS Performed at [facility], [place] [address] [place], [place] [address]    REPTSTATUS 06/30/2022 FINAL 06/25/2022 1258    Cardiac Enzymes: No results for input(s): "CKTOTAL", "CKMB", "CKMBINDEX", "TROPONINI" in the last 168 hours. CBG: Recent Labs  Lab 07/15/22 0755 07/15/22 1217 07/15/22 1654 07/15/22 2028 07/16/22 0718  GLUCAP 130* 130* 283* 205* 195*   Iron  Studies: No results for input(s): "IRON", "TIBC", "TRANSFERRIN", "FERRITIN" in the last 72 hours. '@lablastinr3'$ @ Studies/Results: No results found. Medications:   ceFAZolin (ANCEF) IV 2 g (07/14/22 1211)    amiodarone  100 mg Oral Daily   apixaban  5 mg Oral BID   atorvastatin  40 mg Oral QHS   Chlorhexidine Gluconate Cloth  6 each Topical Q0600   darbepoetin (ARANESP) injection - DIALYSIS  100 mcg Subcutaneous Q Sat-1800   docusate sodium  100 mg Oral Daily   doxercalciferol  4 mcg Oral Q T,Th,Sa-HD   fentaNYL  1 patch Transdermal Q72H   insulin aspart  0-6 Units Subcutaneous TID WC   insulin aspart  3 Units Subcutaneous TID WC   insulin glargine-yfgn  35 Units Subcutaneous QHS   midodrine  10 mg Oral Q T,Th,Sa-HD   nutrition supplement (JUVEN)  1 packet Oral BID BM   pantoprazole  40 mg Oral Daily   pregabalin  100 mg Oral BID   rOPINIRole  2 mg Oral QHS   sevelamer carbonate  1,600 mg Oral TID WC   tamsulosin  0.4 mg Oral Daily   torsemide  80 mg Oral Once per day on Sun Mon Wed Fri   zinc sulfate  220 mg Oral Daily    Outpatient Dialysis Orders:  [facility]  4:15 450/800 112 kg 2.0 K/2.0 Ca AVF - No heparin  - mircera 100 mcg every 4 weeks (to be given on 10/28 - last outpatient) - hectorol 4 mcg every tx  Assessment/Plan: 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 Off abx at this time. PAD: Complicates wound healing ESRD: Continue HD on usual TTS schedule - next HD 11/7.  BP/volume: Well above prior outpatient EDW despite amputation - suspect scale error, but will also try to maximize UF with each EDW. On torsemide on non-HD days and midodrine with HD prn. Reviewing outpatient records - was not reaching that dry weight and was feeling awful with high UF goals. was leaving more 118kg range - see how low can get here and will adjust weight on d/c. 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 Secondary HPTH: CorrCa ok, Phos high - restarted home binder  (Renvela 2/meals) Nutrition: On protein supps for wound healing T2DM: Per primary Dispo: To SNF for rehab  Anice Paganini, PA-C 07/16/2022, 8:56 AM  [facility] Pager: [phone]

## 2022-07-16 NOTE — Progress Notes (Signed)
PT Cancellation Note  Patient Details Name: [name] MRN: [id number] DOB: [birth year]   Cancelled Treatment:    Reason Eval/Treat Not Completed: Other (comment).  Declined over HA, retry as time and pt allow.   Ramond Dial 07/16/2022, 3:03 PM  Mee Hives, PT PhD Acute Rehab Dept. Number: [place] [facility] [place]

## 2022-07-16 NOTE — TOC Progression Note (Addendum)
Transition of Care [facility]) - Progression Note    Patient Details  Name: TIMOHTY RENBARGER MRN: [id number] Date of Birth: Jun 29, 1956  [facility] CM/SW Contact  Joanne Chars, LCSW Phone Number: 07/16/2022, 8:54 AM  Clinical Narrative:   Per Patsey Berthold, insurance auth still pending at this time.   1245: [employer] called, needed PT notes, which were faxed.  1500: TC Paula, [place].  Josem Kaufmann has been approved.  1540: CSW confirmed restart for HD tomorrow.  Nevin Bloodgood not aware pt was HD.  [place] provided: TTS schedule, 1115 arrival, 1135 chair time.  Nevin Bloodgood was able to confirm with her transportation that they can accommodate this schedule for pt to restart outpt HD tomorrow.    Expected Discharge Plan: [place] Barriers to Discharge: Continued Medical Work up, SNF Pending bed offer  Expected Discharge Plan and Services Expected Discharge Plan: [place] In-house Referral: Clinical Social Work   Post Acute Care Choice: [place] Living arrangements for the past 2 months: Single Family Home Expected Discharge Date: 07/16/22                                     Social Determinants of Health (SDOH) Interventions    Readmission Risk Interventions    06/30/2021   11:53 AM  Readmission Risk Prevention Plan  Transportation Screening Complete  Medication Review Press photographer) Complete  HRI or [place] Complete  SW Recovery Care/Counseling Consult Complete  [place] Not Applicable

## 2022-07-16 NOTE — Plan of Care (Signed)
RN called report to nurse IDA at pt SNF/Rehab, pt discharging today

## 2022-07-16 NOTE — Care Management Important Message (Signed)
Important Message  Patient Details  Name: [name] MRN: [id number] Date of Birth: 09/02/1956   Medicare Important Message Given:  Yes     Hannah Beat 07/16/2022, 12:37 PM

## 2022-07-16 NOTE — Plan of Care (Signed)
  Problem: Coping: Goal: Ability to adjust to condition or change in health will improve Outcome: Progressing   Problem: Metabolic: Goal: Ability to maintain appropriate glucose levels will improve Outcome: Progressing   Problem: Skin Integrity: Goal: Risk for impaired skin integrity will decrease Outcome: Progressing   Problem: Activity: Goal: Ability to perform//tolerate increased activity and mobilize with assistive devices will improve Outcome: Progressing   Problem: Self-Care: Goal: Ability to meet self-care needs will improve Outcome: Progressing

## 2022-07-16 NOTE — Consult Note (Signed)
[facility] Nurse Consult Note: Reason for Consult: Skin tear at lateral edge of sacral silicone foam dressing; medical adhesive related skin injury (MARSI) Wound type:trauma Pressure Injury POA: N/A Measurement:Bedside RN to measure and document measurement on Nursing Flow Sheet with next dressing change Wound RFF:[id number], red, moist Drainage (amount, consistency, odor) scant serosanguinous Periwound:intact Dressing procedure/placement/frequency: I will provide guidance for nursing via the orders using a NS cleanse, followed by covering the affected area with xeroform gauze topped with dry gauze and topping with silicone foam. The sacral foam will be oriented so that the "tip" is pointing away from the anus.Xeroform is to be changed daily and the sacral foam reused for up to 3 days. Changing of the foam PRN soiling is requested.  Turning and repositioning to minimize time in the supine position is in place.  [facility] nursing team will not follow, but will remain available to this patient, the nursing and medical teams.  Please re-consult if needed.  Thank you for inviting [place] to participate in this patient's Plan of Care.  Maudie Flakes, MSN, RN, CNS, [place], Serita Grammes, [employer], [employer] phone:  [phone]

## 2022-07-16 NOTE — Progress Notes (Signed)
Patient ID: [name], male   DOB: [birth year], 66 y.o.   MRN: [id number] Patient is seen in follow-up status post left below-knee amputation.  Awaiting insurance authorization for discharge to skilled nursing.  Possible discharge today.

## 2022-07-17 ENCOUNTER — Telehealth: Payer: Self-pay

## 2022-07-17 DIAGNOSIS — I132 Hypertensive heart and chronic kidney disease with heart failure and with stage 5 chronic kidney disease, or end stage renal disease: Secondary | ICD-10-CM | POA: Diagnosis not present

## 2022-07-17 DIAGNOSIS — N2581 Secondary hyperparathyroidism of renal origin: Secondary | ICD-10-CM | POA: Diagnosis not present

## 2022-07-17 DIAGNOSIS — Z89512 Acquired absence of left leg below knee: Secondary | ICD-10-CM | POA: Diagnosis not present

## 2022-07-17 DIAGNOSIS — I38 Endocarditis, valve unspecified: Secondary | ICD-10-CM | POA: Diagnosis not present

## 2022-07-17 DIAGNOSIS — E1159 Type 2 diabetes mellitus with other circulatory complications: Secondary | ICD-10-CM | POA: Diagnosis not present

## 2022-07-17 DIAGNOSIS — M86272 Subacute osteomyelitis, left ankle and foot: Secondary | ICD-10-CM | POA: Diagnosis not present

## 2022-07-17 DIAGNOSIS — E876 Hypokalemia: Secondary | ICD-10-CM | POA: Diagnosis not present

## 2022-07-17 DIAGNOSIS — Z992 Dependence on renal dialysis: Secondary | ICD-10-CM | POA: Diagnosis not present

## 2022-07-17 DIAGNOSIS — N186 End stage renal disease: Secondary | ICD-10-CM | POA: Diagnosis not present

## 2022-07-17 DIAGNOSIS — D631 Anemia in chronic kidney disease: Secondary | ICD-10-CM | POA: Diagnosis not present

## 2022-07-17 NOTE — Telephone Encounter (Signed)
Nicolette Bang would like clarification on discharge orders for patient?  CB# [phone].  Please advise.  Thank you.

## 2022-07-18 ENCOUNTER — Ambulatory Visit: Payer: Medicare HMO | Admitting: Gastroenterology

## 2022-07-18 DIAGNOSIS — N319 Neuromuscular dysfunction of bladder, unspecified: Secondary | ICD-10-CM | POA: Diagnosis not present

## 2022-07-18 DIAGNOSIS — E1140 Type 2 diabetes mellitus with diabetic neuropathy, unspecified: Secondary | ICD-10-CM | POA: Diagnosis not present

## 2022-07-18 DIAGNOSIS — E119 Type 2 diabetes mellitus without complications: Secondary | ICD-10-CM | POA: Diagnosis not present

## 2022-07-18 DIAGNOSIS — R079 Chest pain, unspecified: Secondary | ICD-10-CM | POA: Diagnosis not present

## 2022-07-18 NOTE — Telephone Encounter (Signed)
I called and lm on vm to call back with details.

## 2022-07-19 DIAGNOSIS — I38 Endocarditis, valve unspecified: Secondary | ICD-10-CM | POA: Diagnosis not present

## 2022-07-19 DIAGNOSIS — Z992 Dependence on renal dialysis: Secondary | ICD-10-CM | POA: Diagnosis not present

## 2022-07-19 DIAGNOSIS — N186 End stage renal disease: Secondary | ICD-10-CM | POA: Diagnosis not present

## 2022-07-19 DIAGNOSIS — N2581 Secondary hyperparathyroidism of renal origin: Secondary | ICD-10-CM | POA: Diagnosis not present

## 2022-07-19 DIAGNOSIS — E876 Hypokalemia: Secondary | ICD-10-CM | POA: Diagnosis not present

## 2022-07-19 DIAGNOSIS — D631 Anemia in chronic kidney disease: Secondary | ICD-10-CM | POA: Diagnosis not present

## 2022-07-19 NOTE — Telephone Encounter (Signed)
From patient.

## 2022-07-21 DIAGNOSIS — E876 Hypokalemia: Secondary | ICD-10-CM | POA: Diagnosis not present

## 2022-07-21 DIAGNOSIS — N186 End stage renal disease: Secondary | ICD-10-CM | POA: Diagnosis not present

## 2022-07-21 DIAGNOSIS — N2581 Secondary hyperparathyroidism of renal origin: Secondary | ICD-10-CM | POA: Diagnosis not present

## 2022-07-21 DIAGNOSIS — Z992 Dependence on renal dialysis: Secondary | ICD-10-CM | POA: Diagnosis not present

## 2022-07-21 DIAGNOSIS — D631 Anemia in chronic kidney disease: Secondary | ICD-10-CM | POA: Diagnosis not present

## 2022-07-21 DIAGNOSIS — I38 Endocarditis, valve unspecified: Secondary | ICD-10-CM | POA: Diagnosis not present

## 2022-07-24 DIAGNOSIS — D509 Iron deficiency anemia, unspecified: Secondary | ICD-10-CM | POA: Diagnosis not present

## 2022-07-24 DIAGNOSIS — Z992 Dependence on renal dialysis: Secondary | ICD-10-CM | POA: Diagnosis not present

## 2022-07-24 DIAGNOSIS — N2581 Secondary hyperparathyroidism of renal origin: Secondary | ICD-10-CM | POA: Diagnosis not present

## 2022-07-24 DIAGNOSIS — D631 Anemia in chronic kidney disease: Secondary | ICD-10-CM | POA: Diagnosis not present

## 2022-07-24 DIAGNOSIS — N186 End stage renal disease: Secondary | ICD-10-CM | POA: Diagnosis not present

## 2022-07-24 DIAGNOSIS — I38 Endocarditis, valve unspecified: Secondary | ICD-10-CM | POA: Diagnosis not present

## 2022-07-25 ENCOUNTER — Telehealth: Payer: Self-pay

## 2022-07-25 ENCOUNTER — Ambulatory Visit (INDEPENDENT_AMBULATORY_CARE_PROVIDER_SITE_OTHER): Payer: Medicare HMO | Admitting: Family

## 2022-07-25 ENCOUNTER — Encounter: Payer: Self-pay | Admitting: Family

## 2022-07-25 DIAGNOSIS — Z89511 Acquired absence of right leg below knee: Secondary | ICD-10-CM

## 2022-07-25 DIAGNOSIS — Z89512 Acquired absence of left leg below knee: Secondary | ICD-10-CM

## 2022-07-25 NOTE — Telephone Encounter (Signed)
Okay, I will call and let him know.  Thank you.

## 2022-07-25 NOTE — Progress Notes (Signed)
Office Visit Note   Patient: [name]           Date of Birth: [birth year]           MRN: [id number] Visit Date: 07/25/2022              Requested by: [name], PA-C [address] [place],  [place] [address] PCP: [name], PA-C  Chief Complaint  Patient presents with   Left Leg - Routine Post Op    07/04/2022 left BKA kerecis graft       HPI: The patient is a 66 year old gentleman who presents today for 2 separate issues.  He is status post left below-knee amputation on October 25 of this year he is currently residing at rehab.  He is wearing a shrinker over dry dressing continues his limb protector.  He is status post a remote right below-knee amputation as well. his current socket is ill fitting.  He has been wearing over 2 layers of ply and still coming out of his socket today is not too large due to volume loss.  Is currently working with Merry Proud on new socket set up for the right.  Patient is a new left and existing right transtibial  amputee.  Patient's current comorbidities are not expected to impact the ability to function with the prescribed prosthesis. Patient verbally communicates a strong desire to use a prosthesis. Patient currently requires mobility aids to ambulate without a prosthesis.  Expects not to use mobility aids with a new prosthesis.  Patient is a K2 level ambulator that will use a prosthesis to walk around their home and the community over low level environmental barriers.      Assessment & Plan: Visit Diagnoses: No diagnosis found.  Plan: Given an order today for new socket set up with supplies for his right below-knee amputation.  Also an order for new prosthesis set up for his new amputation to his left, left below-knee amputation.  Follow-Up Instructions: No follow-ups on file.   Ortho Exam  Patient is alert, oriented, no adenopathy, well-dressed, normal affect, normal respiratory effort. On examination of the left residual limb  this is consolidating well staples are in place this is healing quite well there is one 1 cm in length area of the has not fully healed there is 1 drop of serosanguineous drainage.  Steri-Strip applied Staples harvested   continue daily Dial soap cleansing and dry dressings.  Continue shrinker around-the-clock.  Continue limb protector.  Imaging: No results found. No images are attached to the encounter.  Labs: Lab Results  Component Value Date   HGBA1C 7.6 (H) 01/11/2022   HGBA1C 7.9 (H) 06/14/2021   HGBA1C 5.7 (H) 09/16/2018   ESRSEDRATE 26 (H) 06/23/2022   ESRSEDRATE 40 (H) 01/13/2022   ESRSEDRATE 55 (H) 01/11/2022   CRP 14.0 (H) 06/23/2022   CRP 3.6 (H) 01/13/2022   CRP 4.2 (H) 01/11/2022   REPTSTATUS 06/30/2022 FINAL 06/25/2022   GRAMSTAIN  06/14/2021    NO ORGANISMS SEEN SQUAMOUS EPITHELIAL CELLS PRESENT ABUNDANT WBC PRESENT,BOTH PMN AND MONONUCLEAR MODERATE GRAM POSITIVE COCCI    CULT  06/25/2022    NO GROWTH 5 DAYS Performed at [facility], [place] [address], [place], [place] [address]    LABORGA GROUP B STREP(S.AGALACTIAE)ISOLATED 06/23/2022     Lab Results  Component Value Date   ALBUMIN 2.4 (L) 07/14/2022   ALBUMIN 2.3 (L) 07/09/2022   ALBUMIN 2.7 (L) 07/04/2022   PREALBUMIN 18 06/23/2022  PREALBUMIN 19.0 05/04/2016   PREALBUMIN 25.0 12/16/2015    Lab Results  Component Value Date   MG 1.5 (L) 06/29/2022   MG 1.5 (L) 06/28/2022   MG 1.8 01/14/2022   No results found for: "VD25OH"  Lab Results  Component Value Date   PREALBUMIN 18 06/23/2022   PREALBUMIN 19.0 05/04/2016   PREALBUMIN 25.0 12/16/2015      Latest Ref Rng & Units 07/14/2022    8:03 AM 07/06/2022    5:01 AM 07/05/2022    3:52 AM  CBC EXTENDED  WBC 4.0 - 10.5 K/uL 7.0  8.3  8.4   RBC 4.22 - 5.81 MIL/uL 2.96  2.82  2.69   Hemoglobin 13.0 - 17.0 g/dL 8.9  8.8  8.4   HCT 39.0 - 52.0 % 26.6  25.7  24.6   Platelets 150 - 400 K/uL 276  205  229      There is no height or  weight on file to calculate BMI.  Orders:  No orders of the defined types were placed in this encounter.  No orders of the defined types were placed in this encounter.    Procedures: No procedures performed  Clinical Data: No additional findings.  ROS:  All other systems negative, except as noted in the HPI. Review of Systems  Objective: Vital Signs: There were no vitals taken for this visit.  Specialty Comments:  No specialty comments available.  PMFS History: Patient Active Problem List   Diagnosis Date Noted   Chest wall pain 07/04/2022   S/P BKA (below knee amputation) unilateral, left ([place]) 07/04/2022   Subacute osteomyelitis, left ankle and foot ([place]) 07/03/2022   Atrial fibrillation, chronic ([place]) 01/13/2022   Physical deconditioning 01/13/2022   Anemia of renal disease 01/13/2022   Obesity (BMI 30-39.9) 01/13/2022   Decubitus ulcer of buttock 01/13/2022   Intractable nausea and vomiting with epigastric pain 01/11/2022   Chronic osteomyelitis ([place]) 01/11/2022   Proliferative diabetic retinopathy of left eye with macular edema associated with type 2 diabetes mellitus ([place]) 11/27/2021   Vitreous hemorrhage of right eye ([place]) 89/21/1941   Acute metabolic encephalopathy 74/04/1447   COVID 09/02/2021   Sepsis ([place]) 09/02/2021   Severe sepsis with lactic acidosis ([place]) 06/14/2021   Infection associated with implanted penile prosthesis ([place]) 06/14/2021   ESRD on hemodialysis ([place]) 06/14/2021   Paroxysmal atrial fibrillation with RVR ([place]) 06/14/2021   Carotid stenosis 06/15/2020   Dyslipidemia 06/15/2020   Diabetic macular edema of right eye with proliferative retinopathy associated with type 2 diabetes mellitus ([place]) 01/18/2020   Severe nonproliferative diabetic retinopathy of left eye, with macular edema, associated with type 2 diabetes mellitus ([place]) 01/04/2020   Retinal hemorrhage of right eye 01/04/2020   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   Pseudoclaudication  11/15/2018   Essential hypertension 09/18/2018   S/P BKA (below knee amputation), right ([place]) 09/18/2018   Amputation of toe of left foot ([place]) 09/18/2018   Peripheral artery disease ([place]) 09/18/2018   S/P carotid endarterectomy 09/18/2018   OSA not tolerating CPAP 09/18/2018   Chronic back pain 09/18/2018   Status post reversal of ileostomy 05/21/2018   Normocytic anemia 02/14/2018   Intra-abdominal abscess ([place]) 11/04/2017   Chronic venous hypertension (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 ([place]) 12/11/2016   PAD (peripheral artery disease) ([place]) 11/12/2016   Unilateral primary osteoarthritis, left knee 10/11/2016   History of right below knee amputation ([place]) 07/12/2016   Infected diabetic left foot ulcer with chronic osteomyelitis  Diabetic foot infection ([place]) 05/04/2016   Insulin dependent type 2 diabetes mellitus ([place]) 12/16/2015   Amputated toe ([place]) 10/21/2015   Cellulitis of left lower extremity 09/03/2015   Leg edema, left 09/03/2015   CAD (dz of distal, mid and proximal RCA with implantation of 3 overlapping drug-eluting stent,) 09/03/2015   Chronic diastolic heart failure, NYHA class 2 ([place]) 09/03/2015   Angina pectoris associated with type 2 diabetes mellitus ([place]) 10/28/2014   Hyperlipidemia 08/25/2014   Limb pain 03/20/2013   DJD (degenerative joint disease) 09/25/2012   Migraine 09/25/2012   Neuropathy 09/25/2012   Restless legs syndrome (RLS) 09/25/2012   Chronic obstructive pulmonary disease, unspecified ([place]) 04/25/2012   Unknown cause of morbidity or mortality 04/25/2012   Chronic total occlusion of artery of the extremities ([place]) 04/08/2012   Onychomycosis 02/01/2012   Occlusion and stenosis of carotid artery without mention of cerebral infarction 06/12/2011   GERD (gastroesophageal reflux disease) 05/08/2011   Barrett's esophagus without dysplasia 05/08/2011   Former tobacco use 02/01/2011   Past Medical History:  Diagnosis  Date   Carotid artery occlusion 11/10/10   LEFT CAROTID ENDARTERECTOMY   Chronic kidney disease    Complication of anesthesia    BP WENT UP AT [facility] "   COPD (chronic obstructive pulmonary disease) ([place])    pt denies this dx as of 06/01/20 - no inhaler    Diabetes mellitus without complication ([place])    Diverticulitis    Diverticulosis of colon (without mention of hemorrhage)    DJD (degenerative joint disease)    knees/hands/feet/back/neck   Fatty liver    Full dentures    GERD (gastroesophageal reflux disease)    H/O hiatal hernia    History of blood transfusion    with a past surical procedure per patient 06/01/20   Hyperlipidemia    Hypertension    Neuromuscular disorder ([place])    peripheral neuropathy   Non-pressure chronic ulcer of other part of left foot limited to breakdown of skin ([place]) 11/12/2016   Osteomyelitis (HCC)    left 5th metatarsal   PAD (peripheral artery disease) ([place])    Distal aortogram June 2012. Atherectomy left popliteal artery July 2012.    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 Before age 47   Hyperlipidemia Brother    Hypertension Brother    Hypertension Son    Arthritis Other        GRANDMOTHER   Hypertension Other  OTHER FAMILY MEMBERS    Past Surgical History:  Procedure Laterality Date    ABDOMINAL AORTOGRAM W/LOWER EXTREMITY N/A 06/23/2021   Procedure: ABDOMINAL AORTOGRAM W/LOWER EXTREMITY;  Surgeon: Nigel Mormon, MD;  Location: [facility];  Service: Cardiovascular;  Laterality: N/A;   AMPUTATION  11/05/2011   Procedure: AMPUTATION RAY;  Surgeon: Wylene Simmer, MD;  Location: [facility];  Service: Orthopedics;  Laterality: Right;  Amputation of Right 4&5th Toes   AMPUTATION Left 11/26/2012   Procedure: AMPUTATION RAY;  Surgeon: Wylene Simmer, MD;  Location: [facility];  Service: Orthopedics;  Laterality: Left;  fourth ray amputation   AMPUTATION Right 08/27/2014   Procedure: Transmetatarsal Amputation;  Surgeon: Newt Minion, MD;  Location: [facility];  Service: Orthopedics;  Laterality: Right;   AMPUTATION Right 01/14/2015   Procedure: AMPUTATION BELOW KNEE;  Surgeon: Newt Minion, MD;  Location: [facility];  Service: Orthopedics;  Laterality: Right;   AMPUTATION Left 10/21/2015   Procedure: Left Foot 5th Ray Amputation;  Surgeon: Newt Minion, MD;  Location: [facility];  Service: Orthopedics;  Laterality: Left;   AMPUTATION Left 07/04/2022   Procedure: LEFT BELOW KNEE AMPUTATION;  Surgeon: Newt Minion, MD;  Location: [facility];  Service: Orthopedics;  Laterality: Left;   ANTERIOR FUSION CERVICAL SPINE  02/06/06   C4-5, C5-6, C6-7; SURGEON DR. MAX COHEN   AV FISTULA PLACEMENT Left 06/02/2020   Procedure: ARTERIOVENOUS (AV) FISTULA CREATION LEFT;  Surgeon: Waynetta Sandy, MD;  Location: [facility];  Service: Vascular;  Laterality: Left;   BACK SURGERY     x 3   [place] Left 07/21/2020   Procedure: LEFT UPPER ARM ATERIOVENOUS SUPERFISTULALIZATION;  Surgeon: Waynetta Sandy, MD;  Location: [facility];  Service: Vascular;  Laterality: Left;   BELOW KNEE LEG AMPUTATION Right    CARDIAC CATHETERIZATION  10/31/04   2009   CAROTID ENDARTERECTOMY  11/10/10   CAROTID ENDARTERECTOMY Left 11/10/2010   Subtotal occlusion of left internal carotid artery with left hemispheric  transient ischemic attacks.   CAROTID STENT     CARPAL TUNNEL RELEASE Right 10/21/2013   Procedure: RIGHT CARPAL TUNNEL RELEASE;  Surgeon: Wynonia Sours, MD;  Location: [facility];  Service: Orthopedics;  Laterality: Right;   CHOLECYSTECTOMY     COLON SURGERY     COLONOSCOPY     COLOSTOMY REVERSAL  05/21/2018   ileostomy reversal   CYSTOSCOPY WITH STENT PLACEMENT Bilateral 01/13/2018   Procedure: CYSTOSCOPY WITH BILATERAL URETERAL CATHETER PLACEMENT;  Surgeon: Ardis Hughs, MD;  Location[facility];  Service: Urology;  Laterality: Bilateral;   ESOPHAGEAL MANOMETRY Bilateral 07/19/2014   Procedure: ESOPHAGEAL MANOMETRY (EM);  Surgeon: Jerene Bears, MD;  [facility];  Service: Gastroenterology;  Laterality: Bilateral;   EYE SURGERY Bilateral 2020   cataract   FEMORAL ARTERY STENT     x6   FINGER SURGERY     FOOT SURGERY  04/25/2016    EXCISION BASE 5TH METATARSAL AND PARTIAL CUBOID LEFT FOOT   HERNIA REPAIR     LEFT INGUINAL AND UMBILICAL REPAIRS   HERNIA REPAIR     I & D EXTREMITY Left 04/25/2016   Procedure: EXCISION BASE 5TH METATARSAL AND PARTIAL CUBOID LEFT FOOT;  Surgeon: Newt Minion, MD;  Location: [facility];  Service: Orthopedics;  Laterality: Left;   ILEOSTOMY  01/13/2018   Procedure: ILEOSTOMY;  Surgeon: Clovis Riley, MD;  Location[facility];  Service: General;;   ILEOSTOMY CLOSURE  N/A 05/21/2018   Procedure: ILEOSTOMY REVERSAL ERAS PATHWAY;  Surgeon: Clovis Riley, MD;  Location: [facility];  Service: General;  Laterality: N/A;   IR RADIOLOGIST EVAL & MGMT  11/19/2017   IR RADIOLOGIST EVAL & MGMT  12/03/2017   IR RADIOLOGIST EVAL & MGMT  12/18/2017   JOINT REPLACEMENT Right 2001   Total knee   LAMINECTOMY     X 3 LUMBAR AND X 2 CERVICAL SPINE OPERATIONS   LAPAROSCOPIC CHOLECYSTECTOMY W/ CHOLANGIOGRAPHY  11/09/04   SURGEON DR. McLean CATH AND CORONARY ANGIOGRAPHY N/A 09/16/2018   Procedure: LEFT HEART CATH AND CORONARY ANGIOGRAPHY;   Surgeon: Nigel Mormon, MD;  Location: [facility];  Service: Cardiovascular;  Laterality: N/A;   LEFT HEART CATHETERIZATION WITH CORONARY ANGIOGRAM N/A 10/29/2014   Procedure: LEFT HEART CATHETERIZATION WITH CORONARY ANGIOGRAM;  Surgeon: Laverda Page, MD;  Location[facility];  Service: Cardiovascular;  Laterality: N/A;   LIGATION OF COMPETING BRANCHES OF ARTERIOVENOUS FISTULA Left 07/21/2020   Procedure: LIGATION OF COMPETING BRANCHES OF LEFT UPPER ARM ARTERIOVENOUS FISTULA;  Surgeon: Waynetta Sandy, MD;  Location: [facility];  Service: Vascular;  Laterality: Left;   LOWER EXTREMITY ANGIOGRAM N/A 03/19/2012   Procedure: LOWER EXTREMITY ANGIOGRAM;  Surgeon: Burnell Blanks, MD;  Location: [facility];  Service: Cardiovascular;  Laterality: N/A;   LOWER EXTREMITY ANGIOGRAPHY N/A 06/20/2021   Procedure: LOWER EXTREMITY ANGIOGRAPHY;  Surgeon: Nigel Mormon, MD;  Location: [facility];  Service: Cardiovascular;  Laterality: N/A;   NECK SURGERY     PARTIAL COLECTOMY N/A 01/13/2018   Procedure: LAPAROSCOPIC ASSISTED   SIGMOID COLECTOMY ILEOSTOMY;  Surgeon: Clovis Riley, MD;  [facility];  Service: General;  Laterality: N/A;   PENILE PROSTHESIS IMPLANT  08/14/05   INFRAPUBIC INSERTION OF INFLATABLE PENILE PROSTHESIS; SURGEON DR. Amalia Hailey   PENILE PROSTHESIS IMPLANT     PERCUTANEOUS CORONARY STENT INTERVENTION (PCI-S) Right 10/29/2014   Procedure: PERCUTANEOUS CORONARY STENT INTERVENTION (PCI-S);  Surgeon: Laverda Page, MD;  Location: [facility];  Service: Cardiovascular;  Laterality: Right;   PERIPHERAL VASCULAR INTERVENTION Left 06/23/2021   Procedure: PERIPHERAL VASCULAR INTERVENTION;  Surgeon: Nigel Mormon, MD;  Location: [facility];  Service: Cardiovascular;  Laterality: Left;   REMOVAL OF PENILE PROSTHESIS N/A 06/14/2021   Procedure: Removal of THREE piece inflatable penile prosthesis;  Surgeon: Lucas Mallow, MD;   Location: [facility];  Service: Urology;  Laterality: N/A;   SHOULDER ARTHROSCOPY     SPINE SURGERY     TOE AMPUTATION Left    TONSILLECTOMY     TOTAL KNEE ARTHROPLASTY  07/2002   RIGHT KNEE ; SURGEON  DR. [name] ALSO HAD ARTHROSCOPIC RIGHT KNEE IN  10/2001   TOTAL KNEE ARTHROPLASTY     ULNAR NERVE TRANSPOSITION Right 10/21/2013   Procedure: RIGHT ELBOW  ULNAR NERVE DECOMPRESSION;  Surgeon: Wynonia Sours, MD;  Location: [facility];  Service: Orthopedics;  Laterality: Right;   Social History   Occupational History   Occupation: Magazine features editor: UNEMPLOYED  Tobacco Use   Smoking status: Former    Packs/day: 2.00    Years: 35.00    Total pack years: 70.00    Types: Cigarettes    Quit date: 10/28/2011    Years since quitting: 10.7   Smokeless tobacco: Never  Vaping Use   Vaping Use: Some days   Substances: Nicotine  Substance and Sexual Activity   Alcohol use: Not Currently    Comment: "not in a long time"   Drug use: Never   Sexual activity: Yes    Birth control/protection: Implant    Comment: penile implant

## 2022-07-25 NOTE — Telephone Encounter (Signed)
Patient is calling with questions about a nitroglycerin patch, he is residing at [facility]. He was previously prescribed these patches by Dr. Meridee Score, whom stopped the Rx on 07/16/2022. Patient is wanting [place] to refill since the other provider stopped.   Please advise, his number is [phone].

## 2022-07-25 NOTE — Telephone Encounter (Signed)
He does not need them anymore. It was given for the wound and not he has had amputation, no further need for this

## 2022-07-26 DIAGNOSIS — D509 Iron deficiency anemia, unspecified: Secondary | ICD-10-CM | POA: Diagnosis not present

## 2022-07-26 DIAGNOSIS — Z992 Dependence on renal dialysis: Secondary | ICD-10-CM | POA: Diagnosis not present

## 2022-07-26 DIAGNOSIS — N2581 Secondary hyperparathyroidism of renal origin: Secondary | ICD-10-CM | POA: Diagnosis not present

## 2022-07-26 DIAGNOSIS — N186 End stage renal disease: Secondary | ICD-10-CM | POA: Diagnosis not present

## 2022-07-26 DIAGNOSIS — Z4781 Encounter for orthopedic aftercare following surgical amputation: Secondary | ICD-10-CM | POA: Diagnosis not present

## 2022-07-26 DIAGNOSIS — I38 Endocarditis, valve unspecified: Secondary | ICD-10-CM | POA: Diagnosis not present

## 2022-07-26 DIAGNOSIS — M86272 Subacute osteomyelitis, left ankle and foot: Secondary | ICD-10-CM | POA: Diagnosis not present

## 2022-07-26 DIAGNOSIS — D631 Anemia in chronic kidney disease: Secondary | ICD-10-CM | POA: Diagnosis not present

## 2022-07-27 ENCOUNTER — Telehealth: Payer: Self-pay | Admitting: Orthopedic Surgery

## 2022-07-27 NOTE — Telephone Encounter (Signed)
SW pt, he is talking about his limb protector. I told him while he is lying in the bed he can take it off but when he gets up to transfer he needs to have it on at all times.

## 2022-07-27 NOTE — Telephone Encounter (Signed)
Pt called in stating that he have a wrap on his leg... Pt was wondering if he needs to take the wrap off so his leg can get some air... Pt requesting callback

## 2022-07-28 DIAGNOSIS — D509 Iron deficiency anemia, unspecified: Secondary | ICD-10-CM | POA: Diagnosis not present

## 2022-07-28 DIAGNOSIS — I38 Endocarditis, valve unspecified: Secondary | ICD-10-CM | POA: Diagnosis not present

## 2022-07-28 DIAGNOSIS — D631 Anemia in chronic kidney disease: Secondary | ICD-10-CM | POA: Diagnosis not present

## 2022-07-28 DIAGNOSIS — N2581 Secondary hyperparathyroidism of renal origin: Secondary | ICD-10-CM | POA: Diagnosis not present

## 2022-07-28 DIAGNOSIS — N186 End stage renal disease: Secondary | ICD-10-CM | POA: Diagnosis not present

## 2022-07-28 DIAGNOSIS — Z992 Dependence on renal dialysis: Secondary | ICD-10-CM | POA: Diagnosis not present

## 2022-07-30 DIAGNOSIS — Z992 Dependence on renal dialysis: Secondary | ICD-10-CM | POA: Diagnosis not present

## 2022-07-30 DIAGNOSIS — N2581 Secondary hyperparathyroidism of renal origin: Secondary | ICD-10-CM | POA: Diagnosis not present

## 2022-07-30 DIAGNOSIS — N186 End stage renal disease: Secondary | ICD-10-CM | POA: Diagnosis not present

## 2022-07-30 DIAGNOSIS — D509 Iron deficiency anemia, unspecified: Secondary | ICD-10-CM | POA: Diagnosis not present

## 2022-08-01 DIAGNOSIS — D509 Iron deficiency anemia, unspecified: Secondary | ICD-10-CM | POA: Diagnosis not present

## 2022-08-01 DIAGNOSIS — N186 End stage renal disease: Secondary | ICD-10-CM | POA: Diagnosis not present

## 2022-08-01 DIAGNOSIS — N2581 Secondary hyperparathyroidism of renal origin: Secondary | ICD-10-CM | POA: Diagnosis not present

## 2022-08-01 DIAGNOSIS — Z992 Dependence on renal dialysis: Secondary | ICD-10-CM | POA: Diagnosis not present

## 2022-08-03 DIAGNOSIS — Z4781 Encounter for orthopedic aftercare following surgical amputation: Secondary | ICD-10-CM | POA: Diagnosis not present

## 2022-08-03 DIAGNOSIS — Z89512 Acquired absence of left leg below knee: Secondary | ICD-10-CM | POA: Diagnosis not present

## 2022-08-03 DIAGNOSIS — H5201 Hypermetropia, right eye: Secondary | ICD-10-CM | POA: Diagnosis not present

## 2022-08-03 DIAGNOSIS — G8929 Other chronic pain: Secondary | ICD-10-CM | POA: Diagnosis not present

## 2022-08-04 DIAGNOSIS — N2581 Secondary hyperparathyroidism of renal origin: Secondary | ICD-10-CM | POA: Diagnosis not present

## 2022-08-04 DIAGNOSIS — D509 Iron deficiency anemia, unspecified: Secondary | ICD-10-CM | POA: Diagnosis not present

## 2022-08-04 DIAGNOSIS — Z992 Dependence on renal dialysis: Secondary | ICD-10-CM | POA: Diagnosis not present

## 2022-08-04 DIAGNOSIS — N186 End stage renal disease: Secondary | ICD-10-CM | POA: Diagnosis not present

## 2022-08-06 DIAGNOSIS — E11311 Type 2 diabetes mellitus with unspecified diabetic retinopathy with macular edema: Secondary | ICD-10-CM | POA: Diagnosis not present

## 2022-08-07 ENCOUNTER — Non-Acute Institutional Stay: Payer: Medicare HMO | Admitting: Hospice

## 2022-08-07 DIAGNOSIS — Z992 Dependence on renal dialysis: Secondary | ICD-10-CM | POA: Diagnosis not present

## 2022-08-07 DIAGNOSIS — D509 Iron deficiency anemia, unspecified: Secondary | ICD-10-CM | POA: Diagnosis not present

## 2022-08-07 DIAGNOSIS — R531 Weakness: Secondary | ICD-10-CM | POA: Diagnosis not present

## 2022-08-07 DIAGNOSIS — N186 End stage renal disease: Secondary | ICD-10-CM | POA: Diagnosis not present

## 2022-08-07 DIAGNOSIS — R112 Nausea with vomiting, unspecified: Secondary | ICD-10-CM

## 2022-08-07 DIAGNOSIS — Z23 Encounter for immunization: Secondary | ICD-10-CM | POA: Diagnosis not present

## 2022-08-07 DIAGNOSIS — G8929 Other chronic pain: Secondary | ICD-10-CM | POA: Diagnosis not present

## 2022-08-07 DIAGNOSIS — Z515 Encounter for palliative care: Secondary | ICD-10-CM | POA: Diagnosis not present

## 2022-08-07 DIAGNOSIS — E875 Hyperkalemia: Secondary | ICD-10-CM | POA: Diagnosis not present

## 2022-08-07 DIAGNOSIS — N2581 Secondary hyperparathyroidism of renal origin: Secondary | ICD-10-CM | POA: Diagnosis not present

## 2022-08-07 DIAGNOSIS — M549 Dorsalgia, unspecified: Secondary | ICD-10-CM | POA: Diagnosis not present

## 2022-08-07 NOTE — Progress Notes (Signed)
    [facility] Palliative [facility] Consult Note Telephone: [phone]  Fax: [phone]    Date of encounter: 06/22/2022 2:30PM PATIENT NAME: [name] [address] [place] [address]   [phone] (home)  DOB: Jul 19, 1956 MRN: [id number] PRIMARY CARE PROVIDER:    Fae [name],  [name] [address] [phone]  REFERRING PROVIDER:   [name], PA-C [address] [place],  [place] [address] [phone]  RESPONSIBLE PARTY:   Self Contact Information     Name Relation Home Work Mobile   [name], [name] Spouse  [phone]   [name], [name]   [phone]        I met face to face with patient in the facility. Palliative Care was asked to follow this patient by consultation request of  Harold Bender, PA-C to address advance care planning and complex medical decision making. This is a follow up visit; visits with patient before he left for dialysis.  NP called [name] and left her a voicemail with callback number  ASSESSMENT AND PLAN / RECOMMENDATIONS:  Goals of Care: Goals include to maximize quality of life and symptom management.  CODE STATUS: Full Code  Symptom Management/Plan:  ESRD-patient continues HD on Tuesday/Thursday/Saturday. He reports feeling tired on dialysis days, otherwise well tolerated.   Nausea- Controlled; continue Zofran 4 mg every 6 hours PRN.   Generalized weakness: worse on dialysis days. PT/OT is ongoing for strengthening, environmental adaptation and transfers. Bilateral BKA; plan to see Ortho on Friday for discussion on prosthetics.   Pain- Currently managed with fentanyl 50 mcg every 72 hours, percocet TID PRN, Lyrica 100 mg BID directed. Bowel regimen. No constipation. Follow up Palliative Care Visit: Palliative care will continue to follow for complex medical decision making, advance care planning, and clarification of goals. Return prn.   PPS:  40%  HOSPICE ELIGIBILITY/DIAGNOSIS: TBD  Chief Complaint: Palliative Medicine follow up visit.   HISTORY OF PRESENT ILLNESS:  [name] is a 66 y.o. year old male  with multiple morbidities requiring close monitoring/management with high risk of complications and morbidity: ESRD on hemodialysis Tuesday-Thursday-Saturday, chronic diastolic CHF, paroxysmal atrial fibrillation, type 2 diabetes, hypertension, hyperlipidemia, CAD, hx of PCI and stent, severe PAD, bilateral BKA, chronic pain, sinus bradycardia, anemia, chronic pain, vascular wound/left foot pain, hx of osteomyelitis. Chronic left foot vascular wound-receiving HH SN for wound care; follows up with Dr. Sharol [name] monthly.  History obtained from review of EMR, discussion with primary team, and interview with family, facility staff/caregiver and/or [name].  I reviewed available labs, medications, imaging, studies and related documents from the EMR.  Records reviewed and summarized above.   I spent  45 minutes providing this consultation; this includes time spent with patient/family, chart review and documentation. More than 50% of the time in this consultation was spent on counseling and coordinating communication. Thank you for the opportunity to participate in the care of [name].  Please call our office at [phone] if we can be of additional assistance.   Note: Portions of this note were generated with Lobbyist. Dictation errors may occur despite best attempts at proofreading.   Teodoro Spray, NP

## 2022-08-09 DIAGNOSIS — Z89512 Acquired absence of left leg below knee: Secondary | ICD-10-CM | POA: Diagnosis not present

## 2022-08-09 DIAGNOSIS — E875 Hyperkalemia: Secondary | ICD-10-CM | POA: Diagnosis not present

## 2022-08-09 DIAGNOSIS — D509 Iron deficiency anemia, unspecified: Secondary | ICD-10-CM | POA: Diagnosis not present

## 2022-08-09 DIAGNOSIS — E1122 Type 2 diabetes mellitus with diabetic chronic kidney disease: Secondary | ICD-10-CM | POA: Diagnosis not present

## 2022-08-09 DIAGNOSIS — N2581 Secondary hyperparathyroidism of renal origin: Secondary | ICD-10-CM | POA: Diagnosis not present

## 2022-08-09 DIAGNOSIS — N186 End stage renal disease: Secondary | ICD-10-CM | POA: Diagnosis not present

## 2022-08-09 DIAGNOSIS — Z23 Encounter for immunization: Secondary | ICD-10-CM | POA: Diagnosis not present

## 2022-08-09 DIAGNOSIS — Z992 Dependence on renal dialysis: Secondary | ICD-10-CM | POA: Diagnosis not present

## 2022-08-09 DIAGNOSIS — Z89511 Acquired absence of right leg below knee: Secondary | ICD-10-CM | POA: Diagnosis not present

## 2022-08-11 DIAGNOSIS — Z23 Encounter for immunization: Secondary | ICD-10-CM | POA: Diagnosis not present

## 2022-08-11 DIAGNOSIS — Z992 Dependence on renal dialysis: Secondary | ICD-10-CM | POA: Diagnosis not present

## 2022-08-11 DIAGNOSIS — D509 Iron deficiency anemia, unspecified: Secondary | ICD-10-CM | POA: Diagnosis not present

## 2022-08-11 DIAGNOSIS — N186 End stage renal disease: Secondary | ICD-10-CM | POA: Diagnosis not present

## 2022-08-11 DIAGNOSIS — N2581 Secondary hyperparathyroidism of renal origin: Secondary | ICD-10-CM | POA: Diagnosis not present

## 2022-08-13 ENCOUNTER — Encounter (HOSPITAL_COMMUNITY): Payer: Self-pay

## 2022-08-14 DIAGNOSIS — D509 Iron deficiency anemia, unspecified: Secondary | ICD-10-CM | POA: Diagnosis not present

## 2022-08-14 DIAGNOSIS — D631 Anemia in chronic kidney disease: Secondary | ICD-10-CM | POA: Diagnosis not present

## 2022-08-14 DIAGNOSIS — G8929 Other chronic pain: Secondary | ICD-10-CM | POA: Diagnosis not present

## 2022-08-14 DIAGNOSIS — Z89512 Acquired absence of left leg below knee: Secondary | ICD-10-CM | POA: Diagnosis not present

## 2022-08-14 DIAGNOSIS — N2581 Secondary hyperparathyroidism of renal origin: Secondary | ICD-10-CM | POA: Diagnosis not present

## 2022-08-14 DIAGNOSIS — N186 End stage renal disease: Secondary | ICD-10-CM | POA: Diagnosis not present

## 2022-08-14 DIAGNOSIS — Z992 Dependence on renal dialysis: Secondary | ICD-10-CM | POA: Diagnosis not present

## 2022-08-15 ENCOUNTER — Ambulatory Visit: Payer: Medicare HMO | Admitting: Family

## 2022-08-15 ENCOUNTER — Encounter: Payer: Self-pay | Admitting: Family

## 2022-08-15 DIAGNOSIS — Z89511 Acquired absence of right leg below knee: Secondary | ICD-10-CM

## 2022-08-15 DIAGNOSIS — Z89512 Acquired absence of left leg below knee: Secondary | ICD-10-CM

## 2022-08-15 NOTE — Progress Notes (Signed)
Post-Op Visit Note   Patient: [name]           Date of Birth: Sep 03, 1956           MRN: [id number] Visit Date: 08/15/2022 PCP: [name], PA-C  Chief Complaint:  Chief Complaint  Patient presents with   Left Leg - Routine Post Op    07/04/2022 left BKA kerecis graft    HPI:  HPI The patient is a 66 year old gentleman who is seen status post left below-knee amputation October 25.  Is currently residing he is skilled nursing for rehab. Ortho Exam On examination of the left residual limb this is well consolidated well-healed there is Steri-Strips in place these were harvested today there is 1 retained staple which was also harvested there is no gaping drainage erythema no sign of infection  Visit Diagnoses: No diagnosis found.  Plan: Proceed with prosthesis set up as scheduled.  He will follow-up in the office in 3 months or on an as-needed basis.  Follow-Up Instructions: Return in about 3 months (around 11/14/2022).   Imaging: No results found.  Orders:  No orders of the defined types were placed in this encounter.  No orders of the defined types were placed in this encounter.    PMFS History: Patient Active Problem List   Diagnosis Date Noted   Chest wall pain 07/04/2022   S/P BKA (below knee amputation) unilateral, left ([place]) 07/04/2022   Subacute osteomyelitis, left ankle and foot ([place]) 07/03/2022   Atrial fibrillation, chronic ([place]) 01/13/2022   Physical deconditioning 01/13/2022   Anemia of renal disease 01/13/2022   Obesity (BMI 30-39.9) 01/13/2022   Decubitus ulcer of buttock 01/13/2022   Intractable nausea and vomiting with epigastric pain 01/11/2022   Chronic osteomyelitis ([place]) 01/11/2022   Proliferative diabetic retinopathy of left eye with macular edema associated with type 2 diabetes mellitus ([place]) 11/27/2021   Vitreous hemorrhage of right eye ([place]) 29/47/6546   Acute metabolic encephalopathy 50/35/4656   COVID 09/02/2021   Sepsis ([place])  09/02/2021   Severe sepsis with lactic acidosis ([place]) 06/14/2021   Infection associated with implanted penile prosthesis ([place]) 06/14/2021   ESRD on hemodialysis ([place]) 06/14/2021   Paroxysmal atrial fibrillation with RVR ([place]) 06/14/2021   Carotid stenosis 06/15/2020   Dyslipidemia 06/15/2020   Diabetic macular edema of right eye with proliferative retinopathy associated with type 2 diabetes mellitus ([place]) 01/18/2020   Severe nonproliferative diabetic retinopathy of left eye, with macular edema, associated with type 2 diabetes mellitus ([place]) 01/04/2020   Retinal hemorrhage of right eye 01/04/2020   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   Pseudoclaudication 11/15/2018   Essential hypertension 09/18/2018   S/P BKA (below knee amputation), right ([place]) 09/18/2018   Amputation of toe of left foot ([place]) 09/18/2018   Peripheral artery disease ([place]) 09/18/2018   S/P carotid endarterectomy 09/18/2018   OSA not tolerating CPAP 09/18/2018   Chronic back pain 09/18/2018   Status post reversal of ileostomy 05/21/2018   Normocytic anemia 02/14/2018   Intra-abdominal abscess ([place]) 11/04/2017   Chronic venous hypertension (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 ([place]) 12/11/2016   PAD (peripheral artery disease) ([place]) 11/12/2016   Unilateral primary osteoarthritis, left knee 10/11/2016   History of right below knee amputation ([place]) 07/12/2016   Infected diabetic left foot ulcer with chronic osteomyelitis    Diabetic foot infection ([place]) 05/04/2016   Insulin dependent type 2 diabetes mellitus ([place]) 12/16/2015   Amputated toe ([place]) 10/21/2015   Cellulitis of left lower  extremity 09/03/2015   Leg edema, left 09/03/2015   CAD (dz of distal, mid and proximal RCA with implantation of 3 overlapping drug-eluting stent,) 09/03/2015   Chronic diastolic heart failure, NYHA class 2 ([place]) 09/03/2015   Angina pectoris associated with type 2 diabetes mellitus ([place]) 10/28/2014   Hyperlipidemia  08/25/2014   Limb pain 03/20/2013   DJD (degenerative joint disease) 09/25/2012   Migraine 09/25/2012   Neuropathy 09/25/2012   Restless legs syndrome (RLS) 09/25/2012   Chronic obstructive pulmonary disease, unspecified ([place]) 04/25/2012   Unknown cause of morbidity or mortality 04/25/2012   Chronic total occlusion of artery of the extremities ([place]) 04/08/2012   Onychomycosis 02/01/2012   Occlusion and stenosis of carotid artery without mention of cerebral infarction 06/12/2011   GERD (gastroesophageal reflux disease) 05/08/2011   Barrett's esophagus without dysplasia 05/08/2011   Former tobacco use 02/01/2011   Past Medical History:  Diagnosis Date   Carotid artery occlusion 11/10/10   LEFT CAROTID ENDARTERECTOMY   Chronic kidney disease    Complication of anesthesia    BP WENT UP AT [facility] "   COPD (chronic obstructive pulmonary disease) ([place])    pt denies this dx as of 06/01/20 - no inhaler    Diabetes mellitus without complication ([place])    Diverticulitis    Diverticulosis of colon (without mention of hemorrhage)    DJD (degenerative joint disease)    knees/hands/feet/back/neck   Fatty liver    Full dentures    GERD (gastroesophageal reflux disease)    H/O hiatal hernia    History of blood transfusion    with a past surical procedure per patient 06/01/20   Hyperlipidemia    Hypertension    Neuromuscular disorder ([place])    peripheral neuropathy   Non-pressure chronic ulcer of other part of left foot limited to breakdown of skin ([place]) 11/12/2016   Osteomyelitis (HCC)    left 5th metatarsal   PAD (peripheral artery disease) ([place])    Distal aortogram June 2012. Atherectomy left popliteal artery July 2012.    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   Unstable angina ([place]) 09/16/2018   Wears glasses     Family History  Problem Relation Age of Onset   Heart  disease Father        Before age 27-  CAD, BPG   Diabetes Father        Amputation   Cancer Father        PROSTATE   Hyperlipidemia Father    Hypertension Father    Heart attack Father        Triple BPG   Varicose Veins Father    Cancer Sister        Breast   Hyperlipidemia Sister    Hypertension Sister    Heart attack Brother    Colon cancer Brother    Diabetes Brother    Heart disease Brother 77       A-Fib. Before age 7   Hyperlipidemia Brother    Hypertension Brother    Hypertension Son    Arthritis Other        GRANDMOTHER   Hypertension Other        OTHER FAMILY MEMBERS    Past Surgical History:  Procedure Laterality Date   ABDOMINAL AORTOGRAM W/LOWER EXTREMITY N/A 06/23/2021   Procedure: ABDOMINAL AORTOGRAM  W/LOWER EXTREMITY;  Surgeon: Nigel Mormon, MD;  Location: [facility];  Service: Cardiovascular;  Laterality: N/A;   AMPUTATION  11/05/2011   Procedure: AMPUTATION RAY;  Surgeon: Wylene Simmer, MD;  Location: [facility];  Service: Orthopedics;  Laterality: Right;  Amputation of Right 4&5th Toes   AMPUTATION Left 11/26/2012   Procedure: AMPUTATION RAY;  Surgeon: Wylene Simmer, MD;  Location: [facility];  Service: Orthopedics;  Laterality: Left;  fourth ray amputation   AMPUTATION Right 08/27/2014   Procedure: Transmetatarsal Amputation;  Surgeon: Newt Minion, MD;  Location: [facility];  Service: Orthopedics;  Laterality: Right;   AMPUTATION Right 01/14/2015   Procedure: AMPUTATION BELOW KNEE;  Surgeon: Newt Minion, MD;  Location: [facility];  Service: Orthopedics;  Laterality: Right;   AMPUTATION Left 10/21/2015   Procedure: Left Foot 5th Ray Amputation;  Surgeon: Newt Minion, MD;  Location: [facility];  Service: Orthopedics;  Laterality: Left;   AMPUTATION Left 07/04/2022   Procedure: LEFT BELOW KNEE AMPUTATION;  Surgeon: Newt Minion, MD;  Location: [facility];  Service: Orthopedics;  Laterality: Left;   ANTERIOR FUSION CERVICAL SPINE  02/06/06   C4-5, C5-6, C6-7; SURGEON DR.  MAX COHEN   AV FISTULA PLACEMENT Left 06/02/2020   Procedure: ARTERIOVENOUS (AV) FISTULA CREATION LEFT;  Surgeon: Waynetta Sandy, MD;  Location: [facility];  Service: Vascular;  Laterality: Left;   BACK SURGERY     x 3   [place] Left 07/21/2020   Procedure: LEFT UPPER ARM ATERIOVENOUS SUPERFISTULALIZATION;  Surgeon: Waynetta Sandy, MD;  Location: [facility];  Service: Vascular;  Laterality: Left;   BELOW KNEE LEG AMPUTATION Right    CARDIAC CATHETERIZATION  10/31/04   2009   CAROTID ENDARTERECTOMY  11/10/10   CAROTID ENDARTERECTOMY Left 11/10/2010   Subtotal occlusion of left internal carotid artery with left hemispheric transient ischemic attacks.   CAROTID STENT     CARPAL TUNNEL RELEASE Right 10/21/2013   Procedure: RIGHT CARPAL TUNNEL RELEASE;  Surgeon: Wynonia Sours, MD;  Location: [facility];  Service: Orthopedics;  Laterality: Right;   CHOLECYSTECTOMY     COLON SURGERY     COLONOSCOPY     COLOSTOMY REVERSAL  05/21/2018   ileostomy reversal   CYSTOSCOPY WITH STENT PLACEMENT Bilateral 01/13/2018   Procedure: CYSTOSCOPY WITH BILATERAL URETERAL CATHETER PLACEMENT;  Surgeon: Ardis Hughs, MD;  [facility];  Service: Urology;  Laterality: Bilateral;   ESOPHAGEAL MANOMETRY Bilateral 07/19/2014   Procedure: ESOPHAGEAL MANOMETRY (EM);  Surgeon: Jerene Bears, MD;  [facility];  Service: Gastroenterology;  Laterality: Bilateral;   EYE SURGERY Bilateral 2020   cataract   FEMORAL ARTERY STENT     x6   FINGER SURGERY     FOOT SURGERY  04/25/2016    EXCISION BASE 5TH METATARSAL AND PARTIAL CUBOID LEFT FOOT   HERNIA REPAIR     LEFT INGUINAL AND UMBILICAL REPAIRS   HERNIA REPAIR     I & D EXTREMITY Left 04/25/2016   Procedure: EXCISION BASE 5TH METATARSAL AND PARTIAL CUBOID LEFT FOOT;  Surgeon: Newt Minion, MD;  Location: [facility];  Service: Orthopedics;  Laterality: Left;   ILEOSTOMY  01/13/2018   Procedure: ILEOSTOMY;  Surgeon:  Clovis Riley, MD;  [facility];  Service: General;;   ILEOSTOMY CLOSURE N/A 05/21/2018   Procedure: ILEOSTOMY REVERSAL ERAS PATHWAY;  Surgeon: Clovis Riley, MD;  Location: [facility];  Service: General;  Laterality: N/A;  IR RADIOLOGIST EVAL & MGMT  11/19/2017   IR RADIOLOGIST EVAL & MGMT  12/03/2017   IR RADIOLOGIST EVAL & MGMT  12/18/2017   JOINT REPLACEMENT Right 2001   Total knee   LAMINECTOMY     X 3 LUMBAR AND X 2 CERVICAL SPINE OPERATIONS   LAPAROSCOPIC CHOLECYSTECTOMY W/ CHOLANGIOGRAPHY  11/09/04   SURGEON DR. [place] CATH AND CORONARY ANGIOGRAPHY N/A 09/16/2018   Procedure: LEFT HEART CATH AND CORONARY ANGIOGRAPHY;  Surgeon: Nigel Mormon, MD;  Location: [facility];  Service: Cardiovascular;  Laterality: N/A;   LEFT HEART CATHETERIZATION WITH CORONARY ANGIOGRAM N/A 10/29/2014   Procedure: LEFT HEART CATHETERIZATION WITH CORONARY ANGIOGRAM;  Surgeon: Laverda Page, MD;  [facility];  Service: Cardiovascular;  Laterality: N/A;   LIGATION OF COMPETING BRANCHES OF ARTERIOVENOUS FISTULA Left 07/21/2020   Procedure: LIGATION OF COMPETING BRANCHES OF LEFT UPPER ARM ARTERIOVENOUS FISTULA;  Surgeon: Waynetta Sandy, MD;  Location: [facility];  Service: Vascular;  Laterality: Left;   LOWER EXTREMITY ANGIOGRAM N/A 03/19/2012   Procedure: LOWER EXTREMITY ANGIOGRAM;  Surgeon: Burnell Blanks, MD;  Location: [facility];  Service: Cardiovascular;  Laterality: N/A;   LOWER EXTREMITY ANGIOGRAPHY N/A 06/20/2021   Procedure: LOWER EXTREMITY ANGIOGRAPHY;  Surgeon: Nigel Mormon, MD;  Location: [facility];  Service: Cardiovascular;  Laterality: N/A;   NECK SURGERY     PARTIAL COLECTOMY N/A 01/13/2018   Procedure: LAPAROSCOPIC ASSISTED   SIGMOID COLECTOMY ILEOSTOMY;  Surgeon: Clovis Riley, MD;  [facility];  Service: General;  Laterality: N/A;   PENILE PROSTHESIS IMPLANT  08/14/05   INFRAPUBIC INSERTION OF INFLATABLE  PENILE PROSTHESIS; SURGEON DR. Amalia Hailey   PENILE PROSTHESIS IMPLANT     PERCUTANEOUS CORONARY STENT INTERVENTION (PCI-S) Right 10/29/2014   Procedure: PERCUTANEOUS CORONARY STENT INTERVENTION (PCI-S);  Surgeon: Laverda Page, MD;  Location: [facility];  Service: Cardiovascular;  Laterality: Right;   PERIPHERAL VASCULAR INTERVENTION Left 06/23/2021   Procedure: PERIPHERAL VASCULAR INTERVENTION;  Surgeon: Nigel Mormon, MD;  Location: [facility];  Service: Cardiovascular;  Laterality: Left;   REMOVAL OF PENILE PROSTHESIS N/A 06/14/2021   Procedure: Removal of THREE piece inflatable penile prosthesis;  Surgeon: Lucas Mallow, MD;  Location: [facility];  Service: Urology;  Laterality: N/A;   SHOULDER ARTHROSCOPY     SPINE SURGERY     TOE AMPUTATION Left    TONSILLECTOMY     TOTAL KNEE ARTHROPLASTY  07/2002   RIGHT KNEE ; SURGEON  DR. [name] ALSO HAD ARTHROSCOPIC RIGHT KNEE IN  10/2001   TOTAL KNEE ARTHROPLASTY     ULNAR NERVE TRANSPOSITION Right 10/21/2013   Procedure: RIGHT ELBOW  ULNAR NERVE DECOMPRESSION;  Surgeon: Wynonia Sours, MD;  Location: [facility];  Service: Orthopedics;  Laterality: Right;   Social History   Occupational History   Occupation: Magazine features editor: UNEMPLOYED  Tobacco Use   Smoking status: Former    Packs/day: 2.00    Years: 35.00    Total pack years: 70.00    Types: Cigarettes    Quit date: 10/28/2011    Years since quitting: 10.8   Smokeless tobacco: Never  Vaping Use   Vaping Use: Some days   Substances: Nicotine  Substance and Sexual Activity   Alcohol use: Not Currently    Comment: "not in a long time"   Drug use: Never   Sexual activity:  Yes    Birth control/protection: Implant    Comment: penile implant

## 2022-08-16 DIAGNOSIS — Z992 Dependence on renal dialysis: Secondary | ICD-10-CM | POA: Diagnosis not present

## 2022-08-16 DIAGNOSIS — N186 End stage renal disease: Secondary | ICD-10-CM | POA: Diagnosis not present

## 2022-08-16 DIAGNOSIS — D509 Iron deficiency anemia, unspecified: Secondary | ICD-10-CM | POA: Diagnosis not present

## 2022-08-16 DIAGNOSIS — N2581 Secondary hyperparathyroidism of renal origin: Secondary | ICD-10-CM | POA: Diagnosis not present

## 2022-08-17 DIAGNOSIS — E1140 Type 2 diabetes mellitus with diabetic neuropathy, unspecified: Secondary | ICD-10-CM | POA: Diagnosis not present

## 2022-08-18 DIAGNOSIS — Z992 Dependence on renal dialysis: Secondary | ICD-10-CM | POA: Diagnosis not present

## 2022-08-18 DIAGNOSIS — N186 End stage renal disease: Secondary | ICD-10-CM | POA: Diagnosis not present

## 2022-08-18 DIAGNOSIS — D509 Iron deficiency anemia, unspecified: Secondary | ICD-10-CM | POA: Diagnosis not present

## 2022-08-18 DIAGNOSIS — N2581 Secondary hyperparathyroidism of renal origin: Secondary | ICD-10-CM | POA: Diagnosis not present

## 2022-08-21 DIAGNOSIS — G8929 Other chronic pain: Secondary | ICD-10-CM | POA: Diagnosis not present

## 2022-08-21 DIAGNOSIS — N2581 Secondary hyperparathyroidism of renal origin: Secondary | ICD-10-CM | POA: Diagnosis not present

## 2022-08-21 DIAGNOSIS — N186 End stage renal disease: Secondary | ICD-10-CM | POA: Diagnosis not present

## 2022-08-21 DIAGNOSIS — I739 Peripheral vascular disease, unspecified: Secondary | ICD-10-CM | POA: Diagnosis not present

## 2022-08-21 DIAGNOSIS — D509 Iron deficiency anemia, unspecified: Secondary | ICD-10-CM | POA: Diagnosis not present

## 2022-08-21 DIAGNOSIS — D631 Anemia in chronic kidney disease: Secondary | ICD-10-CM | POA: Diagnosis not present

## 2022-08-21 DIAGNOSIS — Z992 Dependence on renal dialysis: Secondary | ICD-10-CM | POA: Diagnosis not present

## 2022-08-23 DIAGNOSIS — D509 Iron deficiency anemia, unspecified: Secondary | ICD-10-CM | POA: Diagnosis not present

## 2022-08-23 DIAGNOSIS — Z992 Dependence on renal dialysis: Secondary | ICD-10-CM | POA: Diagnosis not present

## 2022-08-23 DIAGNOSIS — N186 End stage renal disease: Secondary | ICD-10-CM | POA: Diagnosis not present

## 2022-08-23 DIAGNOSIS — N2581 Secondary hyperparathyroidism of renal origin: Secondary | ICD-10-CM | POA: Diagnosis not present

## 2022-08-23 DIAGNOSIS — D631 Anemia in chronic kidney disease: Secondary | ICD-10-CM | POA: Diagnosis not present

## 2022-08-25 DIAGNOSIS — D631 Anemia in chronic kidney disease: Secondary | ICD-10-CM | POA: Diagnosis not present

## 2022-08-25 DIAGNOSIS — N2581 Secondary hyperparathyroidism of renal origin: Secondary | ICD-10-CM | POA: Diagnosis not present

## 2022-08-25 DIAGNOSIS — Z992 Dependence on renal dialysis: Secondary | ICD-10-CM | POA: Diagnosis not present

## 2022-08-25 DIAGNOSIS — N186 End stage renal disease: Secondary | ICD-10-CM | POA: Diagnosis not present

## 2022-08-25 DIAGNOSIS — D509 Iron deficiency anemia, unspecified: Secondary | ICD-10-CM | POA: Diagnosis not present

## 2022-08-28 DIAGNOSIS — N186 End stage renal disease: Secondary | ICD-10-CM | POA: Diagnosis not present

## 2022-08-28 DIAGNOSIS — N2581 Secondary hyperparathyroidism of renal origin: Secondary | ICD-10-CM | POA: Diagnosis not present

## 2022-08-28 DIAGNOSIS — Z992 Dependence on renal dialysis: Secondary | ICD-10-CM | POA: Diagnosis not present

## 2022-08-29 DIAGNOSIS — Z89512 Acquired absence of left leg below knee: Secondary | ICD-10-CM | POA: Diagnosis not present

## 2022-08-30 DIAGNOSIS — N2581 Secondary hyperparathyroidism of renal origin: Secondary | ICD-10-CM | POA: Diagnosis not present

## 2022-08-30 DIAGNOSIS — I482 Chronic atrial fibrillation, unspecified: Secondary | ICD-10-CM | POA: Diagnosis not present

## 2022-08-30 DIAGNOSIS — Z4781 Encounter for orthopedic aftercare following surgical amputation: Secondary | ICD-10-CM | POA: Diagnosis not present

## 2022-08-30 DIAGNOSIS — N186 End stage renal disease: Secondary | ICD-10-CM | POA: Diagnosis not present

## 2022-08-30 DIAGNOSIS — Z89512 Acquired absence of left leg below knee: Secondary | ICD-10-CM | POA: Diagnosis not present

## 2022-08-30 DIAGNOSIS — Z992 Dependence on renal dialysis: Secondary | ICD-10-CM | POA: Diagnosis not present

## 2022-08-30 DIAGNOSIS — N319 Neuromuscular dysfunction of bladder, unspecified: Secondary | ICD-10-CM | POA: Diagnosis not present

## 2022-09-01 DIAGNOSIS — Z992 Dependence on renal dialysis: Secondary | ICD-10-CM | POA: Diagnosis not present

## 2022-09-01 DIAGNOSIS — N2581 Secondary hyperparathyroidism of renal origin: Secondary | ICD-10-CM | POA: Diagnosis not present

## 2022-09-01 DIAGNOSIS — N186 End stage renal disease: Secondary | ICD-10-CM | POA: Diagnosis not present

## 2022-09-04 DIAGNOSIS — Z992 Dependence on renal dialysis: Secondary | ICD-10-CM | POA: Diagnosis not present

## 2022-09-04 DIAGNOSIS — E875 Hyperkalemia: Secondary | ICD-10-CM | POA: Diagnosis not present

## 2022-09-04 DIAGNOSIS — N2581 Secondary hyperparathyroidism of renal origin: Secondary | ICD-10-CM | POA: Diagnosis not present

## 2022-09-04 DIAGNOSIS — N186 End stage renal disease: Secondary | ICD-10-CM | POA: Diagnosis not present

## 2022-09-05 DIAGNOSIS — E113512 Type 2 diabetes mellitus with proliferative diabetic retinopathy with macular edema, left eye: Secondary | ICD-10-CM | POA: Diagnosis not present

## 2022-09-05 DIAGNOSIS — E113511 Type 2 diabetes mellitus with proliferative diabetic retinopathy with macular edema, right eye: Secondary | ICD-10-CM | POA: Diagnosis not present

## 2022-09-06 DIAGNOSIS — Z992 Dependence on renal dialysis: Secondary | ICD-10-CM | POA: Diagnosis not present

## 2022-09-06 DIAGNOSIS — N2581 Secondary hyperparathyroidism of renal origin: Secondary | ICD-10-CM | POA: Diagnosis not present

## 2022-09-06 DIAGNOSIS — E875 Hyperkalemia: Secondary | ICD-10-CM | POA: Diagnosis not present

## 2022-09-06 DIAGNOSIS — N186 End stage renal disease: Secondary | ICD-10-CM | POA: Diagnosis not present

## 2022-09-07 DIAGNOSIS — E1140 Type 2 diabetes mellitus with diabetic neuropathy, unspecified: Secondary | ICD-10-CM | POA: Diagnosis not present

## 2022-09-08 DIAGNOSIS — E875 Hyperkalemia: Secondary | ICD-10-CM | POA: Diagnosis not present

## 2022-09-08 DIAGNOSIS — Z992 Dependence on renal dialysis: Secondary | ICD-10-CM | POA: Diagnosis not present

## 2022-09-08 DIAGNOSIS — N2581 Secondary hyperparathyroidism of renal origin: Secondary | ICD-10-CM | POA: Diagnosis not present

## 2022-09-08 DIAGNOSIS — N186 End stage renal disease: Secondary | ICD-10-CM | POA: Diagnosis not present

## 2022-09-09 DIAGNOSIS — E1122 Type 2 diabetes mellitus with diabetic chronic kidney disease: Secondary | ICD-10-CM | POA: Diagnosis not present

## 2022-09-09 DIAGNOSIS — Z992 Dependence on renal dialysis: Secondary | ICD-10-CM | POA: Diagnosis not present

## 2022-09-09 DIAGNOSIS — N186 End stage renal disease: Secondary | ICD-10-CM | POA: Diagnosis not present

## 2022-09-10 ENCOUNTER — Non-Acute Institutional Stay: Payer: Medicare HMO | Admitting: Hospice

## 2022-09-10 DIAGNOSIS — R52 Pain, unspecified: Secondary | ICD-10-CM | POA: Diagnosis not present

## 2022-09-10 DIAGNOSIS — Z992 Dependence on renal dialysis: Secondary | ICD-10-CM | POA: Diagnosis not present

## 2022-09-10 DIAGNOSIS — Z515 Encounter for palliative care: Secondary | ICD-10-CM

## 2022-09-10 DIAGNOSIS — R112 Nausea with vomiting, unspecified: Secondary | ICD-10-CM | POA: Diagnosis not present

## 2022-09-10 DIAGNOSIS — N186 End stage renal disease: Secondary | ICD-10-CM | POA: Diagnosis not present

## 2022-09-10 DIAGNOSIS — R531 Weakness: Secondary | ICD-10-CM

## 2022-09-10 NOTE — Progress Notes (Signed)
    [facility] Pall[facility] Consult Note Telephone: [phone]  Fax: [phone]    Date of encounter: 06/22/2022 2:30PM PATIENT NAME: [name] [address] [name] [address]   [phone] (home)  DOB: Aug 18, 1956 MRN: [id number] PRIMARY CARE PROVIDER:    Fae [name],  [place] [address] [phone]  REFERRING PROVIDER:   [name], PA-C [address] [place],  [place] [address] [phone]  RESPONSIBLE PARTY:   Self Contact Information     Name Relation Home Work Mobile   Harold, [name] Spouse  [phone]   Harold, [name]   [phone]        I met face to face with patient in the facility. Palliative Care was asked to follow this patient by consultation request of  Harold Bender, PA-C to address advance care planning and complex medical decision making.  ASSESSMENT AND PLAN / RECOMMENDATIONS:  Goals of Care: Goals include to maximize quality of life and symptom management.  CODE STATUS: Full Code  Symptom Management/Plan: ESRD-encourage compliance with HD on Tuesday/Thursday/Saturday, well tolerated. Continue dialyvite/zinc.   Nausea/vomiting- Controlled; continue Zofran 4 mg every 6 hours PRN.   Generalized weakness: worse on dialysis days. PT/OT is ongoing for strengthening, environmental adaptation and transfers. Bilateral BKA; patient affirmed plan to see Ortho on Friday for discussion on prosthetics.   Pain-generalized.  Continue Fentanyl, Percocet and Lyrica as ordered.  Continue bowel regimen to prevent constipation.  Consider plan to wean with risk of decompensation. Follow up Palliative Care Visit: Palliative care will continue to follow for complex medical decision making, advance care planning, and clarification of goals. Return prn.   PPS: 40%  HOSPICE ELIGIBILITY/DIAGNOSIS: TBD  Chief Complaint: Palliative Medicine follow up visit.    HISTORY OF PRESENT ILLNESS:  [name] is a 67 y.o. year old male  with multiple morbidities requiring close monitoring/management with high risk of complications and morbidity: ESRD on hemodialysis Tuesday-Thursday-Saturday, chronic diastolic CHF, paroxysmal atrial fibrillation, type 2 diabetes, hypertension, hyperlipidemia, CAD, hx of PCI and stent, severe PAD, bilateral BKA, chronic pain, sinus bradycardia, anemia, chronic pain, vascular wound/left foot pain, hx of osteomyelitis. Chronic left foot vascular wound-receiving HH SN for wound care; follows up with [name] monthly.  History obtained from review of EMR, discussion with primary team, and interview with family, facility staff/caregiver and/or [name].  I reviewed available labs, medications, imaging, studies and related documents from the EMR.  Records reviewed and summarized above. I spent  45 minutes providing this consultation; this includes time spent with patient/family, chart review and documentation. More than 50% of the time in this consultation was spent on counseling and coordinating communication. Thank you for the opportunity to participate in the care of [name].  Please call our office at [phone] if we can be of additional assistance.   Note: Portions of this note were generated with Lobbyist. Dictation errors may occur despite best attempts at proofreading.   Teodoro Spray, NP

## 2022-09-11 DIAGNOSIS — D631 Anemia in chronic kidney disease: Secondary | ICD-10-CM | POA: Diagnosis not present

## 2022-09-11 DIAGNOSIS — N2581 Secondary hyperparathyroidism of renal origin: Secondary | ICD-10-CM | POA: Diagnosis not present

## 2022-09-11 DIAGNOSIS — N186 End stage renal disease: Secondary | ICD-10-CM | POA: Diagnosis not present

## 2022-09-11 DIAGNOSIS — Z992 Dependence on renal dialysis: Secondary | ICD-10-CM | POA: Diagnosis not present

## 2022-09-11 DIAGNOSIS — D509 Iron deficiency anemia, unspecified: Secondary | ICD-10-CM | POA: Diagnosis not present

## 2022-09-11 DIAGNOSIS — E875 Hyperkalemia: Secondary | ICD-10-CM | POA: Diagnosis not present

## 2022-09-11 DIAGNOSIS — E1122 Type 2 diabetes mellitus with diabetic chronic kidney disease: Secondary | ICD-10-CM | POA: Diagnosis not present

## 2022-09-13 DIAGNOSIS — D631 Anemia in chronic kidney disease: Secondary | ICD-10-CM | POA: Diagnosis not present

## 2022-09-13 DIAGNOSIS — E1122 Type 2 diabetes mellitus with diabetic chronic kidney disease: Secondary | ICD-10-CM | POA: Diagnosis not present

## 2022-09-13 DIAGNOSIS — N2581 Secondary hyperparathyroidism of renal origin: Secondary | ICD-10-CM | POA: Diagnosis not present

## 2022-09-13 DIAGNOSIS — E875 Hyperkalemia: Secondary | ICD-10-CM | POA: Diagnosis not present

## 2022-09-13 DIAGNOSIS — Z992 Dependence on renal dialysis: Secondary | ICD-10-CM | POA: Diagnosis not present

## 2022-09-13 DIAGNOSIS — N186 End stage renal disease: Secondary | ICD-10-CM | POA: Diagnosis not present

## 2022-09-13 DIAGNOSIS — D509 Iron deficiency anemia, unspecified: Secondary | ICD-10-CM | POA: Diagnosis not present

## 2022-09-15 DIAGNOSIS — E875 Hyperkalemia: Secondary | ICD-10-CM | POA: Diagnosis not present

## 2022-09-15 DIAGNOSIS — N186 End stage renal disease: Secondary | ICD-10-CM | POA: Diagnosis not present

## 2022-09-15 DIAGNOSIS — Z992 Dependence on renal dialysis: Secondary | ICD-10-CM | POA: Diagnosis not present

## 2022-09-15 DIAGNOSIS — E1122 Type 2 diabetes mellitus with diabetic chronic kidney disease: Secondary | ICD-10-CM | POA: Diagnosis not present

## 2022-09-15 DIAGNOSIS — D509 Iron deficiency anemia, unspecified: Secondary | ICD-10-CM | POA: Diagnosis not present

## 2022-09-15 DIAGNOSIS — N2581 Secondary hyperparathyroidism of renal origin: Secondary | ICD-10-CM | POA: Diagnosis not present

## 2022-09-15 DIAGNOSIS — D631 Anemia in chronic kidney disease: Secondary | ICD-10-CM | POA: Diagnosis not present

## 2022-09-17 DIAGNOSIS — E113512 Type 2 diabetes mellitus with proliferative diabetic retinopathy with macular edema, left eye: Secondary | ICD-10-CM | POA: Diagnosis not present

## 2022-09-17 DIAGNOSIS — E1140 Type 2 diabetes mellitus with diabetic neuropathy, unspecified: Secondary | ICD-10-CM | POA: Diagnosis not present

## 2022-09-18 DIAGNOSIS — D509 Iron deficiency anemia, unspecified: Secondary | ICD-10-CM | POA: Diagnosis not present

## 2022-09-18 DIAGNOSIS — Z992 Dependence on renal dialysis: Secondary | ICD-10-CM | POA: Diagnosis not present

## 2022-09-18 DIAGNOSIS — N186 End stage renal disease: Secondary | ICD-10-CM | POA: Diagnosis not present

## 2022-09-18 DIAGNOSIS — D631 Anemia in chronic kidney disease: Secondary | ICD-10-CM | POA: Diagnosis not present

## 2022-09-18 DIAGNOSIS — E1122 Type 2 diabetes mellitus with diabetic chronic kidney disease: Secondary | ICD-10-CM | POA: Diagnosis not present

## 2022-09-18 DIAGNOSIS — E875 Hyperkalemia: Secondary | ICD-10-CM | POA: Diagnosis not present

## 2022-09-18 DIAGNOSIS — N2581 Secondary hyperparathyroidism of renal origin: Secondary | ICD-10-CM | POA: Diagnosis not present

## 2022-09-19 DIAGNOSIS — Z89511 Acquired absence of right leg below knee: Secondary | ICD-10-CM | POA: Diagnosis not present

## 2022-09-20 DIAGNOSIS — Z992 Dependence on renal dialysis: Secondary | ICD-10-CM | POA: Diagnosis not present

## 2022-09-20 DIAGNOSIS — N186 End stage renal disease: Secondary | ICD-10-CM | POA: Diagnosis not present

## 2022-09-20 DIAGNOSIS — N2581 Secondary hyperparathyroidism of renal origin: Secondary | ICD-10-CM | POA: Diagnosis not present

## 2022-09-20 DIAGNOSIS — D509 Iron deficiency anemia, unspecified: Secondary | ICD-10-CM | POA: Diagnosis not present

## 2022-09-20 DIAGNOSIS — E875 Hyperkalemia: Secondary | ICD-10-CM | POA: Diagnosis not present

## 2022-09-20 DIAGNOSIS — E1122 Type 2 diabetes mellitus with diabetic chronic kidney disease: Secondary | ICD-10-CM | POA: Diagnosis not present

## 2022-09-20 DIAGNOSIS — D631 Anemia in chronic kidney disease: Secondary | ICD-10-CM | POA: Diagnosis not present

## 2022-09-21 ENCOUNTER — Telehealth: Payer: Self-pay | Admitting: Orthopedic Surgery

## 2022-09-21 DIAGNOSIS — Z4781 Encounter for orthopedic aftercare following surgical amputation: Secondary | ICD-10-CM | POA: Diagnosis not present

## 2022-09-21 DIAGNOSIS — I482 Chronic atrial fibrillation, unspecified: Secondary | ICD-10-CM | POA: Diagnosis not present

## 2022-09-21 DIAGNOSIS — Z89512 Acquired absence of left leg below knee: Secondary | ICD-10-CM | POA: Diagnosis not present

## 2022-09-21 DIAGNOSIS — N319 Neuromuscular dysfunction of bladder, unspecified: Secondary | ICD-10-CM | POA: Diagnosis not present

## 2022-09-21 NOTE — Telephone Encounter (Signed)
SW pt, he is staying at [facility] right now. I told him I can give the nursing staff a verbal order to remove the remaining staple out.

## 2022-09-21 NOTE — Telephone Encounter (Signed)
Called [facility] at [phone], Gabriel Cirri, receptionist, answered and transferred me to nursing station. This rang several times, no answer, and no VM pick up. I will try again later to speak with a live person.

## 2022-09-21 NOTE — Telephone Encounter (Signed)
SW the med tech at [facility]. She states that PT noticed two staples left in incision. Nurse tried to get them out but was unsuccessful and he is scheduled to see [place] on Wednesday 1/17

## 2022-09-21 NOTE — Telephone Encounter (Signed)
Patient states [facility] told him he still has a staple in his incision, and patient states he also thinks there is still a staple in his incision. Please advise..[phone] or [phone]

## 2022-09-22 DIAGNOSIS — N2581 Secondary hyperparathyroidism of renal origin: Secondary | ICD-10-CM | POA: Diagnosis not present

## 2022-09-22 DIAGNOSIS — N186 End stage renal disease: Secondary | ICD-10-CM | POA: Diagnosis not present

## 2022-09-22 DIAGNOSIS — E1122 Type 2 diabetes mellitus with diabetic chronic kidney disease: Secondary | ICD-10-CM | POA: Diagnosis not present

## 2022-09-22 DIAGNOSIS — E875 Hyperkalemia: Secondary | ICD-10-CM | POA: Diagnosis not present

## 2022-09-22 DIAGNOSIS — D631 Anemia in chronic kidney disease: Secondary | ICD-10-CM | POA: Diagnosis not present

## 2022-09-22 DIAGNOSIS — Z992 Dependence on renal dialysis: Secondary | ICD-10-CM | POA: Diagnosis not present

## 2022-09-22 DIAGNOSIS — D509 Iron deficiency anemia, unspecified: Secondary | ICD-10-CM | POA: Diagnosis not present

## 2022-09-25 DIAGNOSIS — D509 Iron deficiency anemia, unspecified: Secondary | ICD-10-CM | POA: Diagnosis not present

## 2022-09-25 DIAGNOSIS — N2581 Secondary hyperparathyroidism of renal origin: Secondary | ICD-10-CM | POA: Diagnosis not present

## 2022-09-25 DIAGNOSIS — N186 End stage renal disease: Secondary | ICD-10-CM | POA: Diagnosis not present

## 2022-09-25 DIAGNOSIS — E1122 Type 2 diabetes mellitus with diabetic chronic kidney disease: Secondary | ICD-10-CM | POA: Diagnosis not present

## 2022-09-25 DIAGNOSIS — D631 Anemia in chronic kidney disease: Secondary | ICD-10-CM | POA: Diagnosis not present

## 2022-09-25 DIAGNOSIS — E875 Hyperkalemia: Secondary | ICD-10-CM | POA: Diagnosis not present

## 2022-09-25 DIAGNOSIS — Z992 Dependence on renal dialysis: Secondary | ICD-10-CM | POA: Diagnosis not present

## 2022-09-26 ENCOUNTER — Encounter: Payer: Self-pay | Admitting: Family

## 2022-09-26 ENCOUNTER — Ambulatory Visit (INDEPENDENT_AMBULATORY_CARE_PROVIDER_SITE_OTHER): Payer: Medicare HMO | Admitting: Family

## 2022-09-26 DIAGNOSIS — Z89512 Acquired absence of left leg below knee: Secondary | ICD-10-CM

## 2022-09-26 DIAGNOSIS — Z89511 Acquired absence of right leg below knee: Secondary | ICD-10-CM

## 2022-09-26 NOTE — Progress Notes (Signed)
Office Visit Note   Patient: [name]           Date of Birth: Oct 26, 1955           MRN: [id number] Visit Date: 09/26/2022              Requested by: [name], PA-C [address] [place],  [place] [address] PCP: [name], PA-C  Chief Complaint  Patient presents with   Left Leg - Routine Post Op    07/04/2022 left BKA       HPI: The patient is a 67 year old gentleman who is seen status post left below-knee amputation he is seen today for concern of retained suture x 2 to his left residual limb he has proceeded with prosthesis set up an appointment with clinic [facility] they were concerned for retained material.  Patient states he feels that he has a splinter denies fever chills no drainage  Assessment & Plan: Visit Diagnoses: No diagnosis found.  Plan: Continue daily Dial soap cleansing.  Proceed with prosthesis set up feel this will heal uneventfully.  Return for any worsening.  Follow-Up Instructions: No follow-ups on file.   Ortho Exam  Patient is alert, oriented, no adenopathy, well-dressed, normal affect, normal respiratory effort. On examination left residual limb this is consolidating quite well the incision is well-healed there is 1 area of the suture portal where there is some absorbable suture material being pushed out this was debrided and harvested with pickups.  There is no erythema scant bloody drainage no warmth  Imaging: No results found. No images are attached to the encounter.  Labs: Lab Results  Component Value Date   HGBA1C 7.6 (H) 01/11/2022   HGBA1C 7.9 (H) 06/14/2021   HGBA1C 5.7 (H) 09/16/2018   ESRSEDRATE 26 (H) 06/23/2022   ESRSEDRATE 40 (H) 01/13/2022   ESRSEDRATE 55 (H) 01/11/2022   CRP 14.0 (H) 06/23/2022   CRP 3.6 (H) 01/13/2022   CRP 4.2 (H) 01/11/2022   REPTSTATUS 06/30/2022 FINAL 06/25/2022   GRAMSTAIN  06/14/2021    NO ORGANISMS SEEN SQUAMOUS EPITHELIAL CELLS PRESENT ABUNDANT WBC PRESENT,BOTH PMN AND  MONONUCLEAR MODERATE GRAM POSITIVE COCCI    CULT  06/25/2022    NO GROWTH 5 DAYS Performed at [facility], [place] [address], [place], [place] [address]    LABORGA GROUP B STREP(S.AGALACTIAE)ISOLATED 06/23/2022     Lab Results  Component Value Date   ALBUMIN 2.4 (L) 07/14/2022   ALBUMIN 2.3 (L) 07/09/2022   ALBUMIN 2.7 (L) 07/04/2022   PREALBUMIN 18 06/23/2022   PREALBUMIN 19.0 05/04/2016   PREALBUMIN 25.0 12/16/2015    Lab Results  Component Value Date   MG 1.5 (L) 06/29/2022   MG 1.5 (L) 06/28/2022   MG 1.8 01/14/2022   No results found for: "VD25OH"  Lab Results  Component Value Date   PREALBUMIN 18 06/23/2022   PREALBUMIN 19.0 05/04/2016   PREALBUMIN 25.0 12/16/2015      Latest Ref Rng & Units 07/14/2022    8:03 AM 07/06/2022    5:01 AM 07/05/2022    3:52 AM  CBC EXTENDED  WBC 4.0 - 10.5 K/uL 7.0  8.3  8.4   RBC 4.22 - 5.81 MIL/uL 2.96  2.82  2.69   Hemoglobin 13.0 - 17.0 g/dL 8.9  8.8  8.4   HCT 39.0 - 52.0 % 26.6  25.7  24.6   Platelets 150 - 400 K/uL 276  205  229      There  is no height or weight on file to calculate BMI.  Orders:  No orders of the defined types were placed in this encounter.  No orders of the defined types were placed in this encounter.    Procedures: No procedures performed  Clinical Data: No additional findings.  ROS:  All other systems negative, except as noted in the HPI. Review of Systems  Objective: Vital Signs: There were no vitals taken for this visit.  Specialty Comments:  No specialty comments available.  PMFS History: Patient Active Problem List   Diagnosis Date Noted   Chest wall pain 07/04/2022   S/P BKA (below knee amputation) unilateral, left ([place]) 07/04/2022   Subacute osteomyelitis, left ankle and foot ([place]) 07/03/2022   Atrial fibrillation, chronic ([place]) 01/13/2022   Physical deconditioning 01/13/2022   Anemia of renal disease 01/13/2022   Obesity (BMI 30-39.9) 01/13/2022   Decubitus  ulcer of buttock 01/13/2022   Intractable nausea and vomiting with epigastric pain 01/11/2022   Chronic osteomyelitis ([place]) 01/11/2022   Proliferative diabetic retinopathy of left eye with macular edema associated with type 2 diabetes mellitus ([place]) 11/27/2021   Vitreous hemorrhage of right eye ([place]) 25/85/2778   Acute metabolic encephalopathy 24/23/5361   COVID 09/02/2021   Sepsis ([place]) 09/02/2021   Severe sepsis with lactic acidosis ([place]) 06/14/2021   Infection associated with implanted penile prosthesis ([place]) 06/14/2021   ESRD on hemodialysis ([place]) 06/14/2021   Paroxysmal atrial fibrillation with RVR ([place]) 06/14/2021   Carotid stenosis 06/15/2020   Dyslipidemia 06/15/2020   Diabetic macular edema of right eye with proliferative retinopathy associated with type 2 diabetes mellitus ([place]) 01/18/2020   Severe nonproliferative diabetic retinopathy of left eye, with macular edema, associated with type 2 diabetes mellitus ([place]) 01/04/2020   Retinal hemorrhage of right eye 01/04/2020   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   Pseudoclaudication 11/15/2018   Essential hypertension 09/18/2018   S/P BKA (below knee amputation), right ([place]) 09/18/2018   Amputation of toe of left foot ([place]) 09/18/2018   Peripheral artery disease ([place]) 09/18/2018   S/P carotid endarterectomy 09/18/2018   OSA not tolerating CPAP 09/18/2018   Chronic back pain 09/18/2018   Status post reversal of ileostomy 05/21/2018   Normocytic anemia 02/14/2018   Intra-abdominal abscess ([place]) 11/04/2017   Chronic venous hypertension (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 ([place]) 12/11/2016   PAD (peripheral artery disease) ([place]) 11/12/2016   Unilateral primary osteoarthritis, left knee 10/11/2016   History of right below knee amputation ([place]) 07/12/2016   Infected diabetic left foot ulcer with chronic osteomyelitis    Diabetic foot infection ([place]) 05/04/2016   Insulin dependent type 2 diabetes mellitus ([place])  12/16/2015   Amputated toe ([place]) 10/21/2015   Cellulitis of left lower extremity 09/03/2015   Leg edema, left 09/03/2015   CAD (dz of distal, mid and proximal RCA with implantation of 3 overlapping drug-eluting stent,) 09/03/2015   Chronic diastolic heart failure, NYHA class 2 ([place]) 09/03/2015   Angina pectoris associated with type 2 diabetes mellitus ([place]) 10/28/2014   Hyperlipidemia 08/25/2014   Limb pain 03/20/2013   DJD (degenerative joint disease) 09/25/2012   Migraine 09/25/2012   Neuropathy 09/25/2012   Restless legs syndrome (RLS) 09/25/2012   Chronic obstructive pulmonary disease, unspecified ([place]) 04/25/2012   Unknown cause of morbidity or mortality 04/25/2012   Chronic total occlusion of artery of the extremities ([place]) 04/08/2012   Onychomycosis 02/01/2012   Occlusion and stenosis of carotid artery without mention of cerebral infarction 06/12/2011   GERD (  gastroesophageal reflux disease) 05/08/2011   Barrett's esophagus without dysplasia 05/08/2011   Former tobacco use 02/01/2011   Past Medical History:  Diagnosis Date   Carotid artery occlusion 11/10/10   LEFT CAROTID ENDARTERECTOMY   Chronic kidney disease    Complication of anesthesia    BP WENT UP AT [facility] "   COPD (chronic obstructive pulmonary disease) ([place])    pt denies this dx as of 06/01/20 - no inhaler    Diabetes mellitus without complication ([place])    Diverticulitis    Diverticulosis of colon (without mention of hemorrhage)    DJD (degenerative joint disease)    knees/hands/feet/back/neck   Fatty liver    Full dentures    GERD (gastroesophageal reflux disease)    H/O hiatal hernia    History of blood transfusion    with a past surical procedure per patient 06/01/20   Hyperlipidemia    Hypertension    Neuromuscular disorder ([place])    peripheral neuropathy   Non-pressure chronic ulcer of other part of left foot limited to breakdown of skin ([place]) 11/12/2016   Osteomyelitis (HCC)    left 5th metatarsal   PAD  (peripheral artery disease) ([place])    Distal aortogram June 2012. Atherectomy left popliteal artery July 2012.    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 Before age 40   Hyperlipidemia Brother    Hypertension Brother    Hypertension Son    Arthritis Other        GRANDMOTHER   Hypertension Other        OTHER FAMILY MEMBERS    Past Surgical History:  Procedure Laterality Date   ABDOMINAL AORTOGRAM W/LOWER EXTREMITY N/A 06/23/2021   Procedure: ABDOMINAL AORTOGRAM W/LOWER EXTREMITY;  Surgeon: Nigel Mormon, MD;  Location: [facility];  Service: Cardiovascular;  Laterality: N/A;   AMPUTATION  11/05/2011   Procedure: AMPUTATION RAY;  Surgeon: Wylene Simmer, MD;  Location: [facility];  Service: Orthopedics;  Laterality: Right;  Amputation of Right 4&5th Toes   AMPUTATION Left 11/26/2012   Procedure: AMPUTATION RAY;  Surgeon: Wylene Simmer, MD;  Location: [facility];  Service: Orthopedics;  Laterality: Left;  fourth ray amputation   AMPUTATION Right 08/27/2014   Procedure: Transmetatarsal Amputation;  Surgeon: Newt Minion, MD;  Location: [facility];  Service: Orthopedics;  Laterality: Right;   AMPUTATION Right 01/14/2015   Procedure: AMPUTATION BELOW KNEE;  Surgeon: Newt Minion, MD;  Location: [facility];  Service: Orthopedics;  Laterality:  Right;    AMPUTATION Left 10/21/2015   Procedure: Left Foot 5th Ray Amputation;  Surgeon: Newt Minion, MD;  Location: [facility];  Service: Orthopedics;  Laterality: Left;   AMPUTATION Left 07/04/2022   Procedure: LEFT BELOW KNEE AMPUTATION;  Surgeon: Newt Minion, MD;  Location: [facility];  Service: Orthopedics;  Laterality: Left;   ANTERIOR FUSION CERVICAL SPINE  02/06/06   C4-5, C5-6, C6-7; SURGEON DR. MAX COHEN   AV FISTULA PLACEMENT Left 06/02/2020   Procedure: ARTERIOVENOUS (AV) FISTULA CREATION LEFT;  Surgeon: Waynetta Sandy, MD;  Location: [facility];  Service: Vascular;  Laterality: Left;   BACK SURGERY     x 3   [place] Left 07/21/2020   Procedure: LEFT UPPER ARM ATERIOVENOUS SUPERFISTULALIZATION;  Surgeon: Waynetta Sandy, MD;  Location: [facility];  Service: Vascular;  Laterality: Left;   BELOW KNEE LEG AMPUTATION Right    CARDIAC CATHETERIZATION  10/31/04   2009   CAROTID ENDARTERECTOMY  11/10/10   CAROTID ENDARTERECTOMY Left 11/10/2010   Subtotal occlusion of left internal carotid artery with left hemispheric transient ischemic attacks.   CAROTID STENT     CARPAL TUNNEL RELEASE Right 10/21/2013   Procedure: RIGHT CARPAL TUNNEL RELEASE;  Surgeon: Wynonia Sours, MD;  Location: [facility];  Service: Orthopedics;  Laterality: Right;   CHOLECYSTECTOMY     COLON SURGERY     COLONOSCOPY     COLOSTOMY REVERSAL  05/21/2018   ileostomy reversal   CYSTOSCOPY WITH STENT PLACEMENT Bilateral 01/13/2018   Procedure: CYSTOSCOPY WITH BILATERAL URETERAL CATHETER PLACEMENT;  Surgeon: Ardis Hughs, MD;  Location: [facility];  Service: Urology;  Laterality: Bilateral;   ESOPHAGEAL MANOMETRY Bilateral 07/19/2014   Procedure: ESOPHAGEAL MANOMETRY (EM);  Surgeon: Jerene Bears, MD;  [facility];  Service: Gastroenterology;  Laterality: Bilateral;   EYE SURGERY Bilateral 2020   cataract   FEMORAL ARTERY STENT     x6   FINGER SURGERY     FOOT SURGERY  04/25/2016     EXCISION BASE 5TH METATARSAL AND PARTIAL CUBOID LEFT FOOT   HERNIA REPAIR     LEFT INGUINAL AND UMBILICAL REPAIRS   HERNIA REPAIR     I & D EXTREMITY Left 04/25/2016   Procedure: EXCISION BASE 5TH METATARSAL AND PARTIAL CUBOID LEFT FOOT;  Surgeon: Newt Minion, MD;  Location: [facility];  Service: Orthopedics;  Laterality: Left;   ILEOSTOMY  01/13/2018   Procedure: ILEOSTOMY;  Surgeon: Clovis Riley, MD;  Location[facility];  Service: General;;   ILEOSTOMY CLOSURE N/A 05/21/2018   Procedure: ILEOSTOMY REVERSAL ERAS PATHWAY;  Surgeon: Clovis Riley, MD;  Location: [facility];  Service: General;  Laterality: N/A;   IR RADIOLOGIST EVAL & MGMT  11/19/2017   IR RADIOLOGIST EVAL & MGMT  12/03/2017   IR RADIOLOGIST EVAL & MGMT  12/18/2017   JOINT REPLACEMENT Right 2001   Total knee   LAMINECTOMY     X 3 LUMBAR AND X 2 CERVICAL SPINE OPERATIONS   LAPAROSCOPIC CHOLECYSTECTOMY W/ CHOLANGIOGRAPHY  11/09/04   SURGEON DR[employer] CATH AND CORONARY ANGIOGRAPHY N/A 09/16/2018   Procedure: LEFT HEART CATH AND CORONARY ANGIOGRAPHY;  Surgeon: Nigel Mormon, MD;  Location: [facility];  Service: Cardiovascular;  Laterality: N/A;   LEFT HEART CATHETERIZATION WITH CORONARY ANGIOGRAM N/A 10/29/2014   Procedure: LEFT HEART CATHETERIZATION WITH CORONARY ANGIOGRAM;  Surgeon: Laverda Page, MD;  Location: [facility]  [facility];  Service: Cardiovascular;  Laterality: N/A;   LIGATION OF COMPETING BRANCHES OF ARTERIOVENOUS FISTULA Left 07/21/2020   Procedure: LIGATION OF COMPETING BRANCHES OF LEFT UPPER ARM ARTERIOVENOUS FISTULA;  Surgeon: Waynetta Sandy, MD;  Location: [facility];  Service: Vascular;  Laterality: Left;   LOWER EXTREMITY ANGIOGRAM N/A 03/19/2012   Procedure: LOWER EXTREMITY ANGIOGRAM;  Surgeon: Burnell Blanks, MD;  Location: [facility] LAB;  Service: Cardiovascular;  Laterality: N/A;   LOWER EXTREMITY ANGIOGRAPHY N/A 06/20/2021   Procedure: LOWER EXTREMITY ANGIOGRAPHY;   Surgeon: Nigel Mormon, MD;  Location: [facility];  Service: Cardiovascular;  Laterality: N/A;   NECK SURGERY     PARTIAL COLECTOMY N/A 01/13/2018   Procedure: LAPAROSCOPIC ASSISTED   SIGMOID COLECTOMY ILEOSTOMY;  Surgeon: Clovis Riley, MD;  Location[facility];  Service: General;  Laterality: N/A;   PENILE PROSTHESIS IMPLANT  08/14/05   INFRAPUBIC INSERTION OF INFLATABLE PENILE PROSTHESIS; SURGEON DR. Amalia Hailey   PENILE PROSTHESIS IMPLANT     PERCUTANEOUS CORONARY STENT INTERVENTION (PCI-S) Right 10/29/2014   Procedure: PERCUTANEOUS CORONARY STENT INTERVENTION (PCI-S);  Surgeon: Laverda Page, MD;  Location: [facility];  Service: Cardiovascular;  Laterality: Right;   PERIPHERAL VASCULAR INTERVENTION Left 06/23/2021   Procedure: PERIPHERAL VASCULAR INTERVENTION;  Surgeon: Nigel Mormon, MD;  Location: [facility];  Service: Cardiovascular;  Laterality: Left;   REMOVAL OF PENILE PROSTHESIS N/A 06/14/2021   Procedure: Removal of THREE piece inflatable penile prosthesis;  Surgeon: Lucas Mallow, MD;  Location: [facility];  Service: Urology;  Laterality: N/A;   SHOULDER ARTHROSCOPY     SPINE SURGERY     TOE AMPUTATION Left    TONSILLECTOMY     TOTAL KNEE ARTHROPLASTY  07/2002   RIGHT KNEE ; SURGEON  DR. [name] ALSO HAD ARTHROSCOPIC RIGHT KNEE IN  10/2001   TOTAL KNEE ARTHROPLASTY     ULNAR NERVE TRANSPOSITION Right 10/21/2013   Procedure: RIGHT ELBOW  ULNAR NERVE DECOMPRESSION;  Surgeon: Wynonia Sours, MD;  Location: [facility];  Service: Orthopedics;  Laterality: Right;   Social History   Occupational History   Occupation: Magazine features editor: UNEMPLOYED  Tobacco Use   Smoking status: Former    Packs/day: 2.00    Years: 35.00    Total pack years: 70.00    Types: Cigarettes    Quit date: 10/28/2011    Years since quitting: 10.9   Smokeless tobacco: Never  Vaping Use   Vaping Use: Some days   Substances: Nicotine  Substance and Sexual  Activity   Alcohol use: Not Currently    Comment: "not in a long time"   Drug use: Never   Sexual activity: Yes    Birth control/protection: Implant    Comment: penile implant

## 2022-09-27 DIAGNOSIS — E1122 Type 2 diabetes mellitus with diabetic chronic kidney disease: Secondary | ICD-10-CM | POA: Diagnosis not present

## 2022-09-27 DIAGNOSIS — Z992 Dependence on renal dialysis: Secondary | ICD-10-CM | POA: Diagnosis not present

## 2022-09-27 DIAGNOSIS — Z89512 Acquired absence of left leg below knee: Secondary | ICD-10-CM | POA: Diagnosis not present

## 2022-09-27 DIAGNOSIS — E875 Hyperkalemia: Secondary | ICD-10-CM | POA: Diagnosis not present

## 2022-09-27 DIAGNOSIS — D631 Anemia in chronic kidney disease: Secondary | ICD-10-CM | POA: Diagnosis not present

## 2022-09-27 DIAGNOSIS — N186 End stage renal disease: Secondary | ICD-10-CM | POA: Diagnosis not present

## 2022-09-27 DIAGNOSIS — D509 Iron deficiency anemia, unspecified: Secondary | ICD-10-CM | POA: Diagnosis not present

## 2022-09-27 DIAGNOSIS — N2581 Secondary hyperparathyroidism of renal origin: Secondary | ICD-10-CM | POA: Diagnosis not present

## 2022-09-28 DIAGNOSIS — G8929 Other chronic pain: Secondary | ICD-10-CM | POA: Diagnosis not present

## 2022-09-28 DIAGNOSIS — M1990 Unspecified osteoarthritis, unspecified site: Secondary | ICD-10-CM | POA: Diagnosis not present

## 2022-09-29 DIAGNOSIS — D631 Anemia in chronic kidney disease: Secondary | ICD-10-CM | POA: Diagnosis not present

## 2022-09-29 DIAGNOSIS — E875 Hyperkalemia: Secondary | ICD-10-CM | POA: Diagnosis not present

## 2022-09-29 DIAGNOSIS — D509 Iron deficiency anemia, unspecified: Secondary | ICD-10-CM | POA: Diagnosis not present

## 2022-09-29 DIAGNOSIS — Z992 Dependence on renal dialysis: Secondary | ICD-10-CM | POA: Diagnosis not present

## 2022-09-29 DIAGNOSIS — E1122 Type 2 diabetes mellitus with diabetic chronic kidney disease: Secondary | ICD-10-CM | POA: Diagnosis not present

## 2022-09-29 DIAGNOSIS — N186 End stage renal disease: Secondary | ICD-10-CM | POA: Diagnosis not present

## 2022-09-29 DIAGNOSIS — N2581 Secondary hyperparathyroidism of renal origin: Secondary | ICD-10-CM | POA: Diagnosis not present

## 2022-10-02 ENCOUNTER — Other Ambulatory Visit: Payer: Self-pay | Admitting: Cardiology

## 2022-10-02 DIAGNOSIS — I480 Paroxysmal atrial fibrillation: Secondary | ICD-10-CM

## 2022-10-02 DIAGNOSIS — E1122 Type 2 diabetes mellitus with diabetic chronic kidney disease: Secondary | ICD-10-CM | POA: Diagnosis not present

## 2022-10-02 DIAGNOSIS — N186 End stage renal disease: Secondary | ICD-10-CM | POA: Diagnosis not present

## 2022-10-02 DIAGNOSIS — D509 Iron deficiency anemia, unspecified: Secondary | ICD-10-CM | POA: Diagnosis not present

## 2022-10-02 DIAGNOSIS — E875 Hyperkalemia: Secondary | ICD-10-CM | POA: Diagnosis not present

## 2022-10-02 DIAGNOSIS — D631 Anemia in chronic kidney disease: Secondary | ICD-10-CM | POA: Diagnosis not present

## 2022-10-02 DIAGNOSIS — N2581 Secondary hyperparathyroidism of renal origin: Secondary | ICD-10-CM | POA: Diagnosis not present

## 2022-10-02 DIAGNOSIS — Z992 Dependence on renal dialysis: Secondary | ICD-10-CM | POA: Diagnosis not present

## 2022-10-02 NOTE — Telephone Encounter (Signed)
Patient is requesting a refill .

## 2022-10-04 ENCOUNTER — Non-Acute Institutional Stay: Payer: Medicare HMO | Admitting: Hospice

## 2022-10-04 DIAGNOSIS — E1122 Type 2 diabetes mellitus with diabetic chronic kidney disease: Secondary | ICD-10-CM | POA: Diagnosis not present

## 2022-10-04 DIAGNOSIS — Z992 Dependence on renal dialysis: Secondary | ICD-10-CM | POA: Diagnosis not present

## 2022-10-04 DIAGNOSIS — D631 Anemia in chronic kidney disease: Secondary | ICD-10-CM | POA: Diagnosis not present

## 2022-10-04 DIAGNOSIS — N2581 Secondary hyperparathyroidism of renal origin: Secondary | ICD-10-CM | POA: Diagnosis not present

## 2022-10-04 DIAGNOSIS — L03116 Cellulitis of left lower limb: Secondary | ICD-10-CM | POA: Diagnosis not present

## 2022-10-04 DIAGNOSIS — E875 Hyperkalemia: Secondary | ICD-10-CM | POA: Diagnosis not present

## 2022-10-04 DIAGNOSIS — N186 End stage renal disease: Secondary | ICD-10-CM | POA: Diagnosis not present

## 2022-10-04 DIAGNOSIS — D509 Iron deficiency anemia, unspecified: Secondary | ICD-10-CM | POA: Diagnosis not present

## 2022-10-05 ENCOUNTER — Encounter: Payer: Self-pay | Admitting: Family

## 2022-10-05 ENCOUNTER — Ambulatory Visit (INDEPENDENT_AMBULATORY_CARE_PROVIDER_SITE_OTHER): Payer: Medicare HMO | Admitting: Family

## 2022-10-05 DIAGNOSIS — L02416 Cutaneous abscess of left lower limb: Secondary | ICD-10-CM

## 2022-10-05 DIAGNOSIS — Z89512 Acquired absence of left leg below knee: Secondary | ICD-10-CM

## 2022-10-05 DIAGNOSIS — Z89511 Acquired absence of right leg below knee: Secondary | ICD-10-CM

## 2022-10-05 NOTE — Progress Notes (Signed)
Post-Op Visit Note   Patient: [name]           Date of Birth: [birth year]           MRN: [id number] Visit Date: 10/05/2022 PCP: [name], PA-C  Chief Complaint:  Chief Complaint  Patient presents with   Left Leg - Pain    Hx BKA 07/04/2022    HPI:  HPI The patient is a 67 year old gentleman who is seen today in follow-up he was seen a little over a week ago in the office and at that time had some retained absorbable suture material that was exposed this was harvested.  Unfortunately today in the same area he has a wound with purulent drainage.  He is having some associated swelling.  Was started on doxycycline yesterday for this infection  Has been working with physical therapy as he continues in rehab his wife feels he is benefiting greatly from this therapy.  He has not yet feeling ready to return home he has not yet completed gait training with his new prosthesis he is unable to transfer comfortably independently Ortho Exam On examination of the left residual limb there is mild edema there is a lateral open area along his incision which is pinpoint in size able to express minimal purulence from this area this probes about 2 to 3 mm deep there is no tunneling no surrounding erythema or warmth  Visit Diagnoses: No diagnosis found.  Plan: Continue the course of doxycycline.  Would like to follow him in the office in about 2 weeks.  He may continue with his physical therapy May continue gait training.  Would benefit from physical therapy visits twice 2-3 times during each week.  Follow-Up Instructions: No follow-ups on file.   Imaging: No results found.  Orders:  No orders of the defined types were placed in this encounter.  No orders of the defined types were placed in this encounter.    PMFS History: Patient Active Problem List   Diagnosis Date Noted   Chest wall pain 07/04/2022   S/P BKA (below knee amputation) unilateral, left ([place]) 07/04/2022   Subacute  osteomyelitis, left ankle and foot ([place]) 07/03/2022   Atrial fibrillation, chronic ([place]) 01/13/2022   Physical deconditioning 01/13/2022   Anemia of renal disease 01/13/2022   Obesity (BMI 30-39.9) 01/13/2022   Decubitus ulcer of buttock 01/13/2022   Intractable nausea and vomiting with epigastric pain 01/11/2022   Chronic osteomyelitis ([place]) 01/11/2022   Proliferative diabetic retinopathy of left eye with macular edema associated with type 2 diabetes mellitus ([place]) 11/27/2021   Vitreous hemorrhage of right eye ([place]) 93/81/8299   Acute metabolic encephalopathy 37/16/9678   COVID 09/02/2021   Sepsis ([place]) 09/02/2021   Severe sepsis with lactic acidosis ([place]) 06/14/2021   Infection associated with implanted penile prosthesis ([place]) 06/14/2021   ESRD on hemodialysis ([place]) 06/14/2021   Paroxysmal atrial fibrillation with RVR ([place]) 06/14/2021   Carotid stenosis 06/15/2020   Dyslipidemia 06/15/2020   Diabetic macular edema of right eye with proliferative retinopathy associated with type 2 diabetes mellitus ([place]) 01/18/2020   Severe nonproliferative diabetic retinopathy of left eye, with macular edema, associated with type 2 diabetes mellitus ([place]) 01/04/2020   Retinal hemorrhage of right eye 01/04/2020   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   Pseudoclaudication 11/15/2018   Essential hypertension 09/18/2018   S/P BKA (below knee amputation), right ([place]) 09/18/2018   Amputation of toe of left foot ([place]) 09/18/2018   Peripheral artery disease ([place]) 09/18/2018  S/P carotid endarterectomy 09/18/2018   OSA not tolerating CPAP 09/18/2018   Chronic back pain 09/18/2018   Status post reversal of ileostomy 05/21/2018   Normocytic anemia 02/14/2018   Intra-abdominal abscess ([place]) 11/04/2017   Chronic venous hypertension (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 ([place]) 12/11/2016   PAD (peripheral artery disease) ([place]) 11/12/2016   Unilateral primary osteoarthritis, left knee  10/11/2016   History of right below knee amputation ([place]) 07/12/2016   Infected diabetic left foot ulcer with chronic osteomyelitis    Diabetic foot infection ([place]) 05/04/2016   Insulin dependent type 2 diabetes mellitus ([place]) 12/16/2015   Amputated toe ([place]) 10/21/2015   Cellulitis of left lower extremity 09/03/2015   Leg edema, left 09/03/2015   CAD (dz of distal, mid and proximal RCA with implantation of 3 overlapping drug-eluting stent,) 09/03/2015   Chronic diastolic heart failure, NYHA class 2 ([place]) 09/03/2015   Angina pectoris associated with type 2 diabetes mellitus ([place]) 10/28/2014   Hyperlipidemia 08/25/2014   Limb pain 03/20/2013   DJD (degenerative joint disease) 09/25/2012   Migraine 09/25/2012   Neuropathy 09/25/2012   Restless legs syndrome (RLS) 09/25/2012   Chronic obstructive pulmonary disease, unspecified ([place]) 04/25/2012   Unknown cause of morbidity or mortality 04/25/2012   Chronic total occlusion of artery of the extremities ([place]) 04/08/2012   Onychomycosis 02/01/2012   Occlusion and stenosis of carotid artery without mention of cerebral infarction 06/12/2011   GERD (gastroesophageal reflux disease) 05/08/2011   Barrett's esophagus without dysplasia 05/08/2011   Former tobacco use 02/01/2011   Past Medical History:  Diagnosis Date   Carotid artery occlusion 11/10/10   LEFT CAROTID ENDARTERECTOMY   Chronic kidney disease    Complication of anesthesia    BP WENT UP AT [facility] "   COPD (chronic obstructive pulmonary disease) ([place])    pt denies this dx as of 06/01/20 - no inhaler    Diabetes mellitus without complication ([place])    Diverticulitis    Diverticulosis of colon (without mention of hemorrhage)    DJD (degenerative joint disease)    knees/hands/feet/back/neck   Fatty liver    Full dentures    GERD (gastroesophageal reflux disease)    H/O hiatal hernia    History of blood transfusion    with a past surical procedure per patient 06/01/20   Hyperlipidemia     Hypertension    Neuromuscular disorder ([place])    peripheral neuropathy   Non-pressure chronic ulcer of other part of left foot limited to breakdown of skin ([place]) 11/12/2016   Osteomyelitis ([place])    left 5th metatarsal   PAD (peripheral artery disease) ([place])    Distal aortogram June 2012. Atherectomy left popliteal artery July 2012.    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   Unstable angina ([place]) 09/16/2018   Wears glasses     Family History  Problem Relation Age of Onset   Heart disease Father        Before age 66-  CAD, BPG   Diabetes Father        Amputation   Cancer Father        PROSTATE   Hyperlipidemia Father    Hypertension Father    Heart attack Father        Triple BPG   Varicose Veins Father  Cancer Sister        Breast   Hyperlipidemia Sister    Hypertension Sister    Heart attack Brother    Colon cancer Brother    Diabetes Brother    Heart disease Brother 19       A-Fib. Before age 37   Hyperlipidemia Brother    Hypertension Brother    Hypertension Son    Arthritis Other        GRANDMOTHER   Hypertension Other        OTHER FAMILY MEMBERS    Past Surgical History:  Procedure Laterality Date   ABDOMINAL AORTOGRAM W/LOWER EXTREMITY N/A 06/23/2021   Procedure: ABDOMINAL AORTOGRAM W/LOWER EXTREMITY;  Surgeon: Nigel Mormon, MD;  Location: [facility];  Service: Cardiovascular;  Laterality: N/A;   AMPUTATION  11/05/2011   Procedure: AMPUTATION RAY;  Surgeon: Wylene Simmer, MD;  Location: [facility];  Service: Orthopedics;  Laterality: Right;  Amputation of Right 4&5th Toes   AMPUTATION Left 11/26/2012   Procedure: AMPUTATION RAY;  Surgeon: Wylene Simmer, MD;  Location: [facility];  Service: Orthopedics;  Laterality: Left;  fourth ray amputation   AMPUTATION Right 08/27/2014   Procedure: Transmetatarsal Amputation;  Surgeon: Newt Minion, MD;  Location: [facility];  Service: Orthopedics;  Laterality: Right;   AMPUTATION Right 01/14/2015   Procedure: AMPUTATION BELOW KNEE;  Surgeon: Newt Minion, MD;  Location: [facility];  Service: Orthopedics;  Laterality: Right;   AMPUTATION Left 10/21/2015   Procedure: Left Foot 5th Ray Amputation;  Surgeon: Newt Minion, MD;  Location: [facility];  Service: Orthopedics;  Laterality: Left;   AMPUTATION Left 07/04/2022   Procedure: LEFT BELOW KNEE AMPUTATION;  Surgeon: Newt Minion, MD;  Location: [facility];  Service: Orthopedics;  Laterality: Left;   ANTERIOR FUSION CERVICAL SPINE  02/06/06   C4-5, C5-6, C6-7; SURGEON DR. MAX COHEN   AV FISTULA PLACEMENT Left 06/02/2020   Procedure: ARTERIOVENOUS (AV) FISTULA CREATION LEFT;  Surgeon: Waynetta Sandy, MD;  Location: [facility];  Service: Vascular;  Laterality: Left;   BACK SURGERY     x 3   [place] Left 07/21/2020   Procedure: LEFT UPPER ARM ATERIOVENOUS SUPERFISTULALIZATION;  Surgeon: Waynetta Sandy, MD;  Location: [facility];  Service: Vascular;  Laterality: Left;   BELOW KNEE LEG AMPUTATION Right    CARDIAC CATHETERIZATION  10/31/04   2009   CAROTID ENDARTERECTOMY  11/10/10   CAROTID ENDARTERECTOMY Left 11/10/2010   Subtotal occlusion of left internal carotid artery with left hemispheric transient ischemic attacks.   CAROTID STENT     CARPAL TUNNEL RELEASE Right 10/21/2013   Procedure: RIGHT CARPAL TUNNEL RELEASE;  Surgeon: Wynonia Sours, MD;  Location: [facility];  Service: Orthopedics;  Laterality: Right;   CHOLECYSTECTOMY     COLON SURGERY     COLONOSCOPY     COLOSTOMY REVERSAL  05/21/2018   ileostomy reversal   CYSTOSCOPY WITH STENT PLACEMENT Bilateral 01/13/2018   Procedure: CYSTOSCOPY WITH BILATERAL URETERAL CATHETER PLACEMENT;  Surgeon: Ardis Hughs, MD;  [facility];  Service: Urology;  Laterality: Bilateral;   ESOPHAGEAL MANOMETRY Bilateral 07/19/2014   Procedure: ESOPHAGEAL MANOMETRY (EM);   Surgeon: Jerene Bears, MD;  [facility];  Service: Gastroenterology;  Laterality: Bilateral;   EYE SURGERY Bilateral 2020   cataract   FEMORAL ARTERY STENT     x6   FINGER SURGERY     FOOT SURGERY  04/25/2016  EXCISION BASE 5TH METATARSAL AND PARTIAL CUBOID LEFT FOOT   HERNIA REPAIR     LEFT INGUINAL AND UMBILICAL REPAIRS   HERNIA REPAIR     I & D EXTREMITY Left 04/25/2016   Procedure: EXCISION BASE 5TH METATARSAL AND PARTIAL CUBOID LEFT FOOT;  Surgeon: Newt Minion, MD;  Location: [facility];  Service: Orthopedics;  Laterality: Left;   ILEOSTOMY  01/13/2018   Procedure: ILEOSTOMY;  Surgeon: Clovis Riley, MD;  [facility];  Service: General;;   ILEOSTOMY CLOSURE N/A 05/21/2018   Procedure: ILEOSTOMY REVERSAL ERAS PATHWAY;  Surgeon: Clovis Riley, MD;  Location: [facility];  Service: General;  Laterality: N/A;   IR RADIOLOGIST EVAL & MGMT  11/19/2017   IR RADIOLOGIST EVAL & MGMT  12/03/2017   IR RADIOLOGIST EVAL & MGMT  12/18/2017   JOINT REPLACEMENT Right 2001   Total knee   LAMINECTOMY     X 3 LUMBAR AND X 2 CERVICAL SPINE OPERATIONS   LAPAROSCOPIC CHOLECYSTECTOMY W/ CHOLANGIOGRAPHY  11/09/04   SURGEON DR. Milford CATH AND CORONARY ANGIOGRAPHY N/A 09/16/2018   Procedure: LEFT HEART CATH AND CORONARY ANGIOGRAPHY;  Surgeon: Nigel Mormon, MD;  Location: [facility];  Service: Cardiovascular;  Laterality: N/A;   LEFT HEART CATHETERIZATION WITH CORONARY ANGIOGRAM N/A 10/29/2014   Procedure: LEFT HEART CATHETERIZATION WITH CORONARY ANGIOGRAM;  Surgeon: Laverda Page, MD;  Location: [facility];  Service: Cardiovascular;  Laterality: N/A;   LIGATION OF COMPETING BRANCHES OF ARTERIOVENOUS FISTULA Left 07/21/2020   Procedure: LIGATION OF COMPETING BRANCHES OF LEFT UPPER ARM ARTERIOVENOUS FISTULA;  Surgeon: Waynetta Sandy, MD;  Location: [facility];  Service: Vascular;  Laterality: Left;   LOWER EXTREMITY ANGIOGRAM N/A 03/19/2012   Procedure:  LOWER EXTREMITY ANGIOGRAM;  Surgeon: Burnell Blanks, MD;  Location: [facility];  Service: Cardiovascular;  Laterality: N/A;   LOWER EXTREMITY ANGIOGRAPHY N/A 06/20/2021   Procedure: LOWER EXTREMITY ANGIOGRAPHY;  Surgeon: Nigel Mormon, MD;  Location: [facility];  Service: Cardiovascular;  Laterality: N/A;   NECK SURGERY     PARTIAL COLECTOMY N/A 01/13/2018   Procedure: LAPAROSCOPIC ASSISTED   SIGMOID COLECTOMY ILEOSTOMY;  Surgeon: Clovis Riley, MD;  [facility];  Service: General;  Laterality: N/A;   PENILE PROSTHESIS IMPLANT  08/14/05   INFRAPUBIC INSERTION OF INFLATABLE PENILE PROSTHESIS; SURGEON DR. Amalia Hailey   PENILE PROSTHESIS IMPLANT     PERCUTANEOUS CORONARY STENT INTERVENTION (PCI-S) Right 10/29/2014   Procedure: PERCUTANEOUS CORONARY STENT INTERVENTION (PCI-S);  Surgeon: Laverda Page, MD;  Location[facility];  Service: Cardiovascular;  Laterality: Right;   PERIPHERAL VASCULAR INTERVENTION Left 06/23/2021   Procedure: PERIPHERAL VASCULAR INTERVENTION;  Surgeon: Nigel Mormon, MD;  Location: [facility];  Service: Cardiovascular;  Laterality: Left;   REMOVAL OF PENILE PROSTHESIS N/A 06/14/2021   Procedure: Removal of THREE piece inflatable penile prosthesis;  Surgeon: Lucas Mallow, MD;  Location: [facility];  Service: Urology;  Laterality: N/A;   SHOULDER ARTHROSCOPY     SPINE SURGERY     TOE AMPUTATION Left    TONSILLECTOMY     TOTAL KNEE ARTHROPLASTY  07/2002   RIGHT KNEE ; SURGEON  DR. [name] ALSO HAD ARTHROSCOPIC RIGHT KNEE IN  10/2001   TOTAL KNEE ARTHROPLASTY     ULNAR NERVE TRANSPOSITION Right 10/21/2013   Procedure: RIGHT ELBOW  ULNAR NERVE DECOMPRESSION;  Surgeon: Wynonia Sours, MD;  Location: [facility];  Service: Orthopedics;  Laterality: Right;   Social History   Occupational History   Occupation: Magazine features editor: UNEMPLOYED  Tobacco Use   Smoking status: Former    Packs/day: 2.00    Years: 35.00     Total pack years: 70.00    Types: Cigarettes    Quit date: 10/28/2011    Years since quitting: 10.9   Smokeless tobacco: Never  Vaping Use   Vaping Use: Some days   Substances: Nicotine  Substance and Sexual Activity   Alcohol use: Not Currently    Comment: "not in a long time"   Drug use: Never   Sexual activity: Yes    Birth control/protection: Implant    Comment: penile implant

## 2022-10-06 DIAGNOSIS — Z992 Dependence on renal dialysis: Secondary | ICD-10-CM | POA: Diagnosis not present

## 2022-10-06 DIAGNOSIS — E875 Hyperkalemia: Secondary | ICD-10-CM | POA: Diagnosis not present

## 2022-10-06 DIAGNOSIS — D631 Anemia in chronic kidney disease: Secondary | ICD-10-CM | POA: Diagnosis not present

## 2022-10-06 DIAGNOSIS — N2581 Secondary hyperparathyroidism of renal origin: Secondary | ICD-10-CM | POA: Diagnosis not present

## 2022-10-06 DIAGNOSIS — D509 Iron deficiency anemia, unspecified: Secondary | ICD-10-CM | POA: Diagnosis not present

## 2022-10-06 DIAGNOSIS — E1122 Type 2 diabetes mellitus with diabetic chronic kidney disease: Secondary | ICD-10-CM | POA: Diagnosis not present

## 2022-10-06 DIAGNOSIS — N186 End stage renal disease: Secondary | ICD-10-CM | POA: Diagnosis not present

## 2022-10-09 DIAGNOSIS — E875 Hyperkalemia: Secondary | ICD-10-CM | POA: Diagnosis not present

## 2022-10-09 DIAGNOSIS — D631 Anemia in chronic kidney disease: Secondary | ICD-10-CM | POA: Diagnosis not present

## 2022-10-09 DIAGNOSIS — L03116 Cellulitis of left lower limb: Secondary | ICD-10-CM | POA: Diagnosis not present

## 2022-10-09 DIAGNOSIS — N186 End stage renal disease: Secondary | ICD-10-CM | POA: Diagnosis not present

## 2022-10-09 DIAGNOSIS — E1122 Type 2 diabetes mellitus with diabetic chronic kidney disease: Secondary | ICD-10-CM | POA: Diagnosis not present

## 2022-10-09 DIAGNOSIS — N2581 Secondary hyperparathyroidism of renal origin: Secondary | ICD-10-CM | POA: Diagnosis not present

## 2022-10-09 DIAGNOSIS — Z992 Dependence on renal dialysis: Secondary | ICD-10-CM | POA: Diagnosis not present

## 2022-10-09 DIAGNOSIS — D509 Iron deficiency anemia, unspecified: Secondary | ICD-10-CM | POA: Diagnosis not present

## 2022-10-10 ENCOUNTER — Non-Acute Institutional Stay: Payer: Medicare HMO | Admitting: Hospice

## 2022-10-10 ENCOUNTER — Ambulatory Visit: Payer: 59 | Admitting: Family

## 2022-10-10 DIAGNOSIS — Z515 Encounter for palliative care: Secondary | ICD-10-CM

## 2022-10-10 DIAGNOSIS — R531 Weakness: Secondary | ICD-10-CM

## 2022-10-10 DIAGNOSIS — Z992 Dependence on renal dialysis: Secondary | ICD-10-CM | POA: Diagnosis not present

## 2022-10-10 DIAGNOSIS — E1122 Type 2 diabetes mellitus with diabetic chronic kidney disease: Secondary | ICD-10-CM | POA: Diagnosis not present

## 2022-10-10 DIAGNOSIS — R112 Nausea with vomiting, unspecified: Secondary | ICD-10-CM | POA: Diagnosis not present

## 2022-10-10 DIAGNOSIS — R52 Pain, unspecified: Secondary | ICD-10-CM

## 2022-10-10 DIAGNOSIS — N186 End stage renal disease: Secondary | ICD-10-CM | POA: Diagnosis not present

## 2022-10-10 NOTE — Progress Notes (Signed)
    [facility] Pall[facility] Consult Note Telephone: [phone]  Fax: [phone]    Date of encounter: 06/22/2022 2:30PM PATIENT NAME: [name] [address] [place] [address]   [phone] (home)  DOB: Jan 17, 1956 MRN: [id number] PRIMARY CARE PROVIDER:    Fae Pippin,  [place] [address] [phone]  REFERRING PROVIDER:   [name], PA-C [address] [place],  [place] [address] [phone]  RESPONSIBLE PARTY:   Self Contact Information     Name Relation Home Work Mobile   Samuele, Storey Spouse  [phone]   Justis, Closser   [phone]        I met face to face with patient in the facility. Palliative Care was asked to follow this patient by consultation request of  [name], PA-C to address advance care planning and complex medical decision making.  ASSESSMENT AND PLAN / RECOMMENDATIONS:  Goals of Care: Goals include to maximize quality of life and symptom management.  CODE STATUS: Full Code  Symptom Management/Plan: ESRD- Education provided on need for continued compliance with hemodialysis - Tuesday/Thursday/Saturday, currently well tolerated. Continue dialyvite/zinc.   Nausea/vomiting- no complain of nausea/vomiting today; continue Zofran 4 mg every 6 hours PRN.   Generalized weakness/mobility: Recently received his new prosthesis . PT/OT is ongoing for strengthening, gait training and ambulation with the new prosthesis. Follow up with Ortho as planned.   Pain-generalized.  Managed with Fentanyl, Percocet and Lyrica as ordered.  Continue bowel regimen to prevent constipation.  Consider plan to wean with risk of decompensation. Follow up Palliative Care Visit: Palliative care will continue to follow for complex medical decision making, advance care planning, and clarification of goals. Return prn.   PPS: 40%  HOSPICE ELIGIBILITY/DIAGNOSIS:  TBD  Chief Complaint: Palliative Medicine follow up visit.   HISTORY OF PRESENT ILLNESS:  [name] is a 67 y.o. year old male  with multiple morbidities requiring close monitoring/management with high risk of complications and morbidity: ESRD on hemodialysis Tuesday-Thursday-Saturday, chronic diastolic CHF, paroxysmal atrial fibrillation, type 2 diabetes, hypertension, hyperlipidemia, CAD, hx of PCI and stent, severe PAD, bilateral BKA, chronic pain, sinus bradycardia, anemia, chronic pain, vascular wound/left foot pain, hx of osteomyelitis. Chronic left foot vascular wound-receiving HH SN for wound care; follows up with [name] monthly.  Patient reports he was started on Doxycycline for left residual limb cellulitis; edema and redness and warmth have cleared; encouraged to take antibiotics as ordered and to completion. No diarrhea, antibiotics well tolerated. He denies fever, pain/discomfort; overall content and happy at the prospect of his walking and going back home to his wife.  History obtained from review of EMR, discussion with primary team, and interview with family, facility staff/caregiver and/or [name].  I reviewed available labs, medications, imaging, studies and related documents from the EMR.  Records reviewed and summarized above.  I spent  35 minutes providing this consultation; this includes time spent with patient/family, chart review and documentation. More than 50% of the time in this consultation was spent on counseling and coordinating communication. Thank you for the opportunity to participate in the care of [name].  Please call our office at [phone] if we can be of additional assistance.   Note: Portions of this note were generated with Lobbyist. Dictation errors may occur despite best attempts at proofreading.   Teodoro Spray, NP

## 2022-10-11 DIAGNOSIS — N2581 Secondary hyperparathyroidism of renal origin: Secondary | ICD-10-CM | POA: Diagnosis not present

## 2022-10-11 DIAGNOSIS — Z992 Dependence on renal dialysis: Secondary | ICD-10-CM | POA: Diagnosis not present

## 2022-10-11 DIAGNOSIS — E875 Hyperkalemia: Secondary | ICD-10-CM | POA: Diagnosis not present

## 2022-10-11 DIAGNOSIS — D631 Anemia in chronic kidney disease: Secondary | ICD-10-CM | POA: Diagnosis not present

## 2022-10-11 DIAGNOSIS — N186 End stage renal disease: Secondary | ICD-10-CM | POA: Diagnosis not present

## 2022-10-11 DIAGNOSIS — D509 Iron deficiency anemia, unspecified: Secondary | ICD-10-CM | POA: Diagnosis not present

## 2022-10-13 DIAGNOSIS — D509 Iron deficiency anemia, unspecified: Secondary | ICD-10-CM | POA: Diagnosis not present

## 2022-10-13 DIAGNOSIS — Z992 Dependence on renal dialysis: Secondary | ICD-10-CM | POA: Diagnosis not present

## 2022-10-13 DIAGNOSIS — N186 End stage renal disease: Secondary | ICD-10-CM | POA: Diagnosis not present

## 2022-10-13 DIAGNOSIS — D631 Anemia in chronic kidney disease: Secondary | ICD-10-CM | POA: Diagnosis not present

## 2022-10-13 DIAGNOSIS — N2581 Secondary hyperparathyroidism of renal origin: Secondary | ICD-10-CM | POA: Diagnosis not present

## 2022-10-13 DIAGNOSIS — E875 Hyperkalemia: Secondary | ICD-10-CM | POA: Diagnosis not present

## 2022-10-15 DIAGNOSIS — E113511 Type 2 diabetes mellitus with proliferative diabetic retinopathy with macular edema, right eye: Secondary | ICD-10-CM | POA: Diagnosis not present

## 2022-10-16 DIAGNOSIS — Z89512 Acquired absence of left leg below knee: Secondary | ICD-10-CM | POA: Diagnosis not present

## 2022-10-16 DIAGNOSIS — Z992 Dependence on renal dialysis: Secondary | ICD-10-CM | POA: Diagnosis not present

## 2022-10-16 DIAGNOSIS — N2581 Secondary hyperparathyroidism of renal origin: Secondary | ICD-10-CM | POA: Diagnosis not present

## 2022-10-16 DIAGNOSIS — E875 Hyperkalemia: Secondary | ICD-10-CM | POA: Diagnosis not present

## 2022-10-16 DIAGNOSIS — N186 End stage renal disease: Secondary | ICD-10-CM | POA: Diagnosis not present

## 2022-10-16 DIAGNOSIS — Z4781 Encounter for orthopedic aftercare following surgical amputation: Secondary | ICD-10-CM | POA: Diagnosis not present

## 2022-10-16 DIAGNOSIS — D631 Anemia in chronic kidney disease: Secondary | ICD-10-CM | POA: Diagnosis not present

## 2022-10-16 DIAGNOSIS — N319 Neuromuscular dysfunction of bladder, unspecified: Secondary | ICD-10-CM | POA: Diagnosis not present

## 2022-10-16 DIAGNOSIS — I482 Chronic atrial fibrillation, unspecified: Secondary | ICD-10-CM | POA: Diagnosis not present

## 2022-10-16 DIAGNOSIS — D509 Iron deficiency anemia, unspecified: Secondary | ICD-10-CM | POA: Diagnosis not present

## 2022-10-17 DIAGNOSIS — E1140 Type 2 diabetes mellitus with diabetic neuropathy, unspecified: Secondary | ICD-10-CM | POA: Diagnosis not present

## 2022-10-18 ENCOUNTER — Encounter (HOSPITAL_COMMUNITY): Payer: Self-pay

## 2022-10-18 DIAGNOSIS — N186 End stage renal disease: Secondary | ICD-10-CM | POA: Diagnosis not present

## 2022-10-18 DIAGNOSIS — D631 Anemia in chronic kidney disease: Secondary | ICD-10-CM | POA: Diagnosis not present

## 2022-10-18 DIAGNOSIS — N2581 Secondary hyperparathyroidism of renal origin: Secondary | ICD-10-CM | POA: Diagnosis not present

## 2022-10-18 DIAGNOSIS — E875 Hyperkalemia: Secondary | ICD-10-CM | POA: Diagnosis not present

## 2022-10-18 DIAGNOSIS — D509 Iron deficiency anemia, unspecified: Secondary | ICD-10-CM | POA: Diagnosis not present

## 2022-10-18 DIAGNOSIS — Z992 Dependence on renal dialysis: Secondary | ICD-10-CM | POA: Diagnosis not present

## 2022-10-19 ENCOUNTER — Ambulatory Visit (INDEPENDENT_AMBULATORY_CARE_PROVIDER_SITE_OTHER): Payer: Medicare HMO | Admitting: Family

## 2022-10-19 DIAGNOSIS — L02416 Cutaneous abscess of left lower limb: Secondary | ICD-10-CM

## 2022-10-19 DIAGNOSIS — Z89512 Acquired absence of left leg below knee: Secondary | ICD-10-CM

## 2022-10-20 DIAGNOSIS — E875 Hyperkalemia: Secondary | ICD-10-CM | POA: Diagnosis not present

## 2022-10-20 DIAGNOSIS — D631 Anemia in chronic kidney disease: Secondary | ICD-10-CM | POA: Diagnosis not present

## 2022-10-20 DIAGNOSIS — N2581 Secondary hyperparathyroidism of renal origin: Secondary | ICD-10-CM | POA: Diagnosis not present

## 2022-10-20 DIAGNOSIS — Z992 Dependence on renal dialysis: Secondary | ICD-10-CM | POA: Diagnosis not present

## 2022-10-20 DIAGNOSIS — N186 End stage renal disease: Secondary | ICD-10-CM | POA: Diagnosis not present

## 2022-10-20 DIAGNOSIS — D509 Iron deficiency anemia, unspecified: Secondary | ICD-10-CM | POA: Diagnosis not present

## 2022-10-22 ENCOUNTER — Encounter (HOSPITAL_COMMUNITY): Payer: Self-pay

## 2022-10-22 DIAGNOSIS — Z89512 Acquired absence of left leg below knee: Secondary | ICD-10-CM | POA: Diagnosis not present

## 2022-10-22 DIAGNOSIS — Z741 Need for assistance with personal care: Secondary | ICD-10-CM | POA: Diagnosis not present

## 2022-10-22 DIAGNOSIS — M6281 Muscle weakness (generalized): Secondary | ICD-10-CM | POA: Diagnosis not present

## 2022-10-22 DIAGNOSIS — Z89511 Acquired absence of right leg below knee: Secondary | ICD-10-CM | POA: Diagnosis not present

## 2022-10-22 DIAGNOSIS — R2681 Unsteadiness on feet: Secondary | ICD-10-CM | POA: Diagnosis not present

## 2022-10-22 DIAGNOSIS — Z4781 Encounter for orthopedic aftercare following surgical amputation: Secondary | ICD-10-CM | POA: Diagnosis not present

## 2022-10-23 ENCOUNTER — Encounter: Payer: Self-pay | Admitting: Family

## 2022-10-23 DIAGNOSIS — D631 Anemia in chronic kidney disease: Secondary | ICD-10-CM | POA: Diagnosis not present

## 2022-10-23 DIAGNOSIS — Z89511 Acquired absence of right leg below knee: Secondary | ICD-10-CM | POA: Diagnosis not present

## 2022-10-23 DIAGNOSIS — R2681 Unsteadiness on feet: Secondary | ICD-10-CM | POA: Diagnosis not present

## 2022-10-23 DIAGNOSIS — Z992 Dependence on renal dialysis: Secondary | ICD-10-CM | POA: Diagnosis not present

## 2022-10-23 DIAGNOSIS — Z89512 Acquired absence of left leg below knee: Secondary | ICD-10-CM | POA: Diagnosis not present

## 2022-10-23 DIAGNOSIS — E875 Hyperkalemia: Secondary | ICD-10-CM | POA: Diagnosis not present

## 2022-10-23 DIAGNOSIS — D509 Iron deficiency anemia, unspecified: Secondary | ICD-10-CM | POA: Diagnosis not present

## 2022-10-23 DIAGNOSIS — Z4781 Encounter for orthopedic aftercare following surgical amputation: Secondary | ICD-10-CM | POA: Diagnosis not present

## 2022-10-23 DIAGNOSIS — N186 End stage renal disease: Secondary | ICD-10-CM | POA: Diagnosis not present

## 2022-10-23 DIAGNOSIS — Z741 Need for assistance with personal care: Secondary | ICD-10-CM | POA: Diagnosis not present

## 2022-10-23 DIAGNOSIS — M6281 Muscle weakness (generalized): Secondary | ICD-10-CM | POA: Diagnosis not present

## 2022-10-23 DIAGNOSIS — N2581 Secondary hyperparathyroidism of renal origin: Secondary | ICD-10-CM | POA: Diagnosis not present

## 2022-10-23 NOTE — Progress Notes (Signed)
Office Visit Note   Patient: [name]           Date of Birth: 1955-10-18           MRN: [id number] Visit Date: 10/19/2022              Requested by: [name], PA-C [address] [place],  [place] [address] PCP: [name], PA-C  Chief Complaint  Patient presents with   Right Leg - Follow-up    Hx BKA 07/04/2022      HPI: The patient is a 67 year old gentleman seen status post below-knee amputation on the left.  He has completed a course of antibiotics for an abscess, suture.  This is resolved.  He continues at skilled nursing with physical therapy has received his prosthesis for the left  Assessment & Plan: Visit Diagnoses: No diagnosis found.  Plan: Proceed with prosthesis gait training.  He will follow-up on an as-needed basis.  Follow-Up Instructions: No follow-ups on file.   Ortho Exam  Patient is alert, oriented, no adenopathy, well-dressed, normal affect, normal respiratory effort. On examination left residual limb this is well consolidated well-healed there is no open area no drainage the abscess and ulcer has resolved.  No warmth Imaging: No results found. No images are attached to the encounter.  Labs: Lab Results  Component Value Date   HGBA1C 7.6 (H) 01/11/2022   HGBA1C 7.9 (H) 06/14/2021   HGBA1C 5.7 (H) 09/16/2018   ESRSEDRATE 26 (H) 06/23/2022   ESRSEDRATE 40 (H) 01/13/2022   ESRSEDRATE 55 (H) 01/11/2022   CRP 14.0 (H) 06/23/2022   CRP 3.6 (H) 01/13/2022   CRP 4.2 (H) 01/11/2022   REPTSTATUS 06/30/2022 FINAL 06/25/2022   GRAMSTAIN  06/14/2021    NO ORGANISMS SEEN SQUAMOUS EPITHELIAL CELLS PRESENT ABUNDANT WBC PRESENT,BOTH PMN AND MONONUCLEAR MODERATE GRAM POSITIVE COCCI    CULT  06/25/2022    NO GROWTH 5 DAYS Performed at [facility], [place] [address] [place], [place] [address]    LABORGA GROUP B STREP(S.AGALACTIAE)ISOLATED 06/23/2022     Lab Results  Component Value Date   ALBUMIN 2.4 (L) 07/14/2022   ALBUMIN 2.3  (L) 07/09/2022   ALBUMIN 2.7 (L) 07/04/2022   PREALBUMIN 18 06/23/2022   PREALBUMIN 19.0 05/04/2016   PREALBUMIN 25.0 12/16/2015    Lab Results  Component Value Date   MG 1.5 (L) 06/29/2022   MG 1.5 (L) 06/28/2022   MG 1.8 01/14/2022   No results found for: "VD25OH"  Lab Results  Component Value Date   PREALBUMIN 18 06/23/2022   PREALBUMIN 19.0 05/04/2016   PREALBUMIN 25.0 12/16/2015      Latest Ref Rng & Units 07/14/2022    8:03 AM 07/06/2022    5:01 AM 07/05/2022    3:52 AM  CBC EXTENDED  WBC 4.0 - 10.5 K/uL 7.0  8.3  8.4   RBC 4.22 - 5.81 MIL/uL 2.96  2.82  2.69   Hemoglobin 13.0 - 17.0 g/dL 8.9  8.8  8.4   HCT 39.0 - 52.0 % 26.6  25.7  24.6   Platelets 150 - 400 K/uL 276  205  229      There is no height or weight on file to calculate BMI.  Orders:  No orders of the defined types were placed in this encounter.  No orders of the defined types were placed in this encounter.    Procedures: No procedures performed  Clinical Data: No additional findings.  ROS:  All  other systems negative, except as noted in the HPI. Review of Systems  Objective: Vital Signs: There were no vitals taken for this visit.  Specialty Comments:  No specialty comments available.  PMFS History: Patient Active Problem List   Diagnosis Date Noted   Chest wall pain 07/04/2022   S/P BKA (below knee amputation) unilateral, left ([place]) 07/04/2022   Subacute osteomyelitis, left ankle and foot ([place]) 07/03/2022   Atrial fibrillation, chronic ([place]) 01/13/2022   Physical deconditioning 01/13/2022   Anemia of renal disease 01/13/2022   Obesity (BMI 30-39.9) 01/13/2022   Decubitus ulcer of buttock 01/13/2022   Intractable nausea and vomiting with epigastric pain 01/11/2022   Chronic osteomyelitis ([place]) 01/11/2022   Proliferative diabetic retinopathy of left eye with macular edema associated with type 2 diabetes mellitus ([place]) 11/27/2021   Vitreous hemorrhage of right eye ([place])  0000000   Acute metabolic encephalopathy [id number]   COVID 09/02/2021   Sepsis ([place]) 09/02/2021   Severe sepsis with lactic acidosis ([place]) 06/14/2021   Infection associated with implanted penile prosthesis ([place]) 06/14/2021   ESRD on hemodialysis ([place]) 06/14/2021   Paroxysmal atrial fibrillation with RVR ([place]) 06/14/2021   Carotid stenosis 06/15/2020   Dyslipidemia 06/15/2020   Diabetic macular edema of right eye with proliferative retinopathy associated with type 2 diabetes mellitus ([place]) 01/18/2020   Severe nonproliferative diabetic retinopathy of left eye, with macular edema, associated with type 2 diabetes mellitus ([place]) 01/04/2020   Retinal hemorrhage of right eye 01/04/2020   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   Pseudoclaudication 11/15/2018   Essential hypertension 09/18/2018   S/P BKA (below knee amputation), right ([place]) 09/18/2018   Amputation of toe of left foot ([place]) 09/18/2018   Peripheral artery disease ([place]) 09/18/2018   S/P carotid endarterectomy 09/18/2018   OSA not tolerating CPAP 09/18/2018   Chronic back pain 09/18/2018   Status post reversal of ileostomy 05/21/2018   Normocytic anemia 02/14/2018   Intra-abdominal abscess ([place]) 11/04/2017   Chronic venous hypertension (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 ([place]) 12/11/2016   PAD (peripheral artery disease) ([place]) 11/12/2016   Unilateral primary osteoarthritis, left knee 10/11/2016   History of right below knee amputation ([place]) 07/12/2016   Infected diabetic left foot ulcer with chronic osteomyelitis    Diabetic foot infection ([place]) 05/04/2016   Insulin dependent type 2 diabetes mellitus ([place]) 12/16/2015   Amputated toe ([place]) 10/21/2015   Cellulitis of left lower extremity 09/03/2015   Leg edema, left 09/03/2015   CAD (dz of distal, mid and proximal RCA with implantation of 3 overlapping drug-eluting stent,) 09/03/2015   Chronic diastolic heart failure, NYHA class 2 ([place]) 09/03/2015    Angina pectoris associated with type 2 diabetes mellitus ([place]) 10/28/2014   Hyperlipidemia 08/25/2014   Limb pain 03/20/2013   DJD (degenerative joint disease) 09/25/2012   Migraine 09/25/2012   Neuropathy 09/25/2012   Restless legs syndrome (RLS) 09/25/2012   Chronic obstructive pulmonary disease, unspecified ([place]) 04/25/2012   Unknown cause of morbidity or mortality 04/25/2012   Chronic total occlusion of artery of the extremities ([place]) 04/08/2012   Onychomycosis 02/01/2012   Occlusion and stenosis of carotid artery without mention of cerebral infarction 06/12/2011   GERD (gastroesophageal reflux disease) 05/08/2011   Barrett's esophagus without dysplasia 05/08/2011   Former tobacco use 02/01/2011   Past Medical History:  Diagnosis Date   Carotid artery occlusion 11/10/10   LEFT CAROTID ENDARTERECTOMY   Chronic kidney disease    Complication of anesthesia    BP WENT UP  AT [facility] "   COPD (chronic obstructive pulmonary disease) ([place])    pt denies this dx as of 06/01/20 - no inhaler    Diabetes mellitus without complication ([place])    Diverticulitis    Diverticulosis of colon (without mention of hemorrhage)    DJD (degenerative joint disease)    knees/hands/feet/back/neck   Fatty liver    Full dentures    GERD (gastroesophageal reflux disease)    H/O hiatal hernia    History of blood transfusion    with a past surical procedure per patient 06/01/20   Hyperlipidemia    Hypertension    Neuromuscular disorder ([place])    peripheral neuropathy   Non-pressure chronic ulcer of other part of left foot limited to breakdown of skin ([place]) 11/12/2016   Osteomyelitis (HCC)    left 5th metatarsal   PAD (peripheral artery disease) ([place])    Distal aortogram June 2012. Atherectomy left popliteal artery July 2012.    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 Before age 57   Hyperlipidemia Brother    Hypertension Brother    Hypertension Son    Arthritis Other        GRANDMOTHER   Hypertension Other        OTHER FAMILY MEMBERS    Past Surgical History:  Procedure Laterality Date   ABDOMINAL AORTOGRAM W/LOWER EXTREMITY N/A 06/23/2021   Procedure: ABDOMINAL AORTOGRAM W/LOWER EXTREMITY;  Surgeon: Nigel Mormon, MD;  Location: [facility];  Service: Cardiovascular;  Laterality: N/A;   AMPUTATION  11/05/2011   Procedure: AMPUTATION RAY;  Surgeon: Wylene Simmer, MD;  Location: [facility];  Service: Orthopedics;  Laterality: Right;  Amputation of Right 4&5th Toes   AMPUTATION Left 11/26/2012   Procedure: AMPUTATION RAY;  Surgeon: Wylene Simmer, MD;  Location: [facility];  Service: Orthopedics;  Laterality: Left;  fourth ray amputation   AMPUTATION Right 08/27/2014   Procedure: Transmetatarsal Amputation;  Surgeon: Newt Minion, MD;  Location: [facility];  Service: Orthopedics;  Laterality: Right;   AMPUTATION Right 01/14/2015   Procedure: AMPUTATION BELOW KNEE;  Surgeon: Newt Minion, MD;  Location: [facility];  Service: Orthopedics;  Laterality: Right;   AMPUTATION Left 10/21/2015   Procedure: Left Foot 5th Ray Amputation;  Surgeon: Newt Minion, MD;  Location: [facility];  Service: Orthopedics;  Laterality: Left;   AMPUTATION Left 07/04/2022   Procedure: LEFT BELOW KNEE AMPUTATION;  Surgeon: Newt Minion, MD;  Location: [facility];  Service: Orthopedics;   Laterality: Left;   ANTERIOR FUSION CERVICAL SPINE  02/06/06   C4-5, C5-6, C6-7; SURGEON DR. MAX COHEN   AV FISTULA PLACEMENT Left 06/02/2020   Procedure: ARTERIOVENOUS (AV) FISTULA CREATION LEFT;  Surgeon: Waynetta Sandy, MD;  Location: [facility];  Service: Vascular;  Laterality: Left;   BACK SURGERY     x 3   [place] Left 07/21/2020   Procedure: LEFT UPPER ARM ATERIOVENOUS SUPERFISTULALIZATION;  Surgeon: Waynetta Sandy, MD;  Location: [facility];  Service: Vascular;  Laterality: Left;   BELOW KNEE LEG AMPUTATION Right    CARDIAC CATHETERIZATION  10/31/04   2009   CAROTID ENDARTERECTOMY  11/10/10   CAROTID ENDARTERECTOMY Left 11/10/2010   Subtotal occlusion of left internal carotid artery with left hemispheric transient ischemic attacks.   CAROTID STENT     CARPAL TUNNEL RELEASE Right 10/21/2013   Procedure: RIGHT CARPAL TUNNEL RELEASE;  Surgeon: Wynonia Sours, MD;  Location: [facility];  Service: Orthopedics;  Laterality: Right;   CHOLECYSTECTOMY     COLON SURGERY     COLONOSCOPY     COLOSTOMY REVERSAL  05/21/2018   ileostomy reversal   CYSTOSCOPY WITH STENT PLACEMENT Bilateral 01/13/2018   Procedure: CYSTOSCOPY WITH BILATERAL URETERAL CATHETER PLACEMENT;  Surgeon: Ardis Hughs, MD;  [facility];  Service: Urology;  Laterality: Bilateral;   ESOPHAGEAL MANOMETRY Bilateral 07/19/2014   Procedure: ESOPHAGEAL MANOMETRY (EM);  Surgeon: Jerene Bears, MD;  [facility];  Service: Gastroenterology;  Laterality: Bilateral;   EYE SURGERY Bilateral 2020   cataract   FEMORAL ARTERY STENT     x6   FINGER SURGERY     FOOT SURGERY  04/25/2016    EXCISION BASE 5TH METATARSAL AND PARTIAL CUBOID LEFT FOOT   HERNIA REPAIR     LEFT INGUINAL AND UMBILICAL REPAIRS   HERNIA REPAIR     I & D EXTREMITY Left 04/25/2016   Procedure: EXCISION BASE 5TH METATARSAL AND PARTIAL CUBOID LEFT FOOT;  Surgeon: Newt Minion, MD;  Location: [facility];  Service:  Orthopedics;  Laterality: Left;   ILEOSTOMY  01/13/2018   Procedure: ILEOSTOMY;  Surgeon: Clovis Riley, MD;  Location[facility];  Service: General;;   ILEOSTOMY CLOSURE N/A 05/21/2018   Procedure: ILEOSTOMY REVERSAL ERAS PATHWAY;  Surgeon: Clovis Riley, MD;  Location: [facility];  Service: General;  Laterality: N/A;   IR RADIOLOGIST EVAL & MGMT  11/19/2017   IR RADIOLOGIST EVAL & MGMT  12/03/2017   IR RADIOLOGIST EVAL & MGMT  12/18/2017   JOINT REPLACEMENT Right 2001   Total knee   LAMINECTOMY     X 3 LUMBAR AND X 2 CERVICAL SPINE OPERATIONS   LAPAROSCOPIC CHOLECYSTECTOMY W/ CHOLANGIOGRAPHY  11/09/04   SURGEON DR. [place] CATH AND CORONARY ANGIOGRAPHY N/A 09/16/2018   Procedure: LEFT HEART CATH AND CORONARY ANGIOGRAPHY;  Surgeon: Nigel Mormon, MD;  Location: [facility];  Service: Cardiovascular;  Laterality: N/A;   LEFT HEART CATHETERIZATION WITH CORONARY ANGIOGRAM N/A 10/29/2014   Procedure: LEFT HEART CATHETERIZATION WITH CORONARY ANGIOGRAM;  Surgeon: Laverda Page, MD;  Location: [facility];  Service: Cardiovascular;  Laterality: N/A;   LIGATION OF COMPETING BRANCHES OF ARTERIOVENOUS FISTULA Left 07/21/2020   Procedure: LIGATION OF COMPETING BRANCHES OF LEFT UPPER ARM ARTERIOVENOUS FISTULA;  Surgeon: Waynetta Sandy, MD;  Location: [facility];  Service: Vascular;  Laterality: Left;   LOWER EXTREMITY ANGIOGRAM  N/A 03/19/2012   Procedure: LOWER EXTREMITY ANGIOGRAM;  Surgeon: Burnell Blanks, MD;  Location: [facility];  Service: Cardiovascular;  Laterality: N/A;   LOWER EXTREMITY ANGIOGRAPHY N/A 06/20/2021   Procedure: LOWER EXTREMITY ANGIOGRAPHY;  Surgeon: Nigel Mormon, MD;  Location: [facility];  Service: Cardiovascular;  Laterality: N/A;   NECK SURGERY     PARTIAL COLECTOMY N/A 01/13/2018   Procedure: LAPAROSCOPIC ASSISTED   SIGMOID COLECTOMY ILEOSTOMY;  Surgeon: Clovis Riley, MD;  [facility];  Service: General;   Laterality: N/A;   PENILE PROSTHESIS IMPLANT  08/14/05   INFRAPUBIC INSERTION OF INFLATABLE PENILE PROSTHESIS; SURGEON DR. Amalia Hailey   PENILE PROSTHESIS IMPLANT     PERCUTANEOUS CORONARY STENT INTERVENTION (PCI-S) Right 10/29/2014   Procedure: PERCUTANEOUS CORONARY STENT INTERVENTION (PCI-S);  Surgeon: Laverda Page, MD;  Location: [facility];  Service: Cardiovascular;  Laterality: Right;   PERIPHERAL VASCULAR INTERVENTION Left 06/23/2021   Procedure: PERIPHERAL VASCULAR INTERVENTION;  Surgeon: Nigel Mormon, MD;  Location: [facility];  Service: Cardiovascular;  Laterality: Left;   REMOVAL OF PENILE PROSTHESIS N/A 06/14/2021   Procedure: Removal of THREE piece inflatable penile prosthesis;  Surgeon: Lucas Mallow, MD;  Location: [facility];  Service: Urology;  Laterality: N/A;   SHOULDER ARTHROSCOPY     SPINE SURGERY     TOE AMPUTATION Left    TONSILLECTOMY     TOTAL KNEE ARTHROPLASTY  07/2002   RIGHT KNEE ; SURGEON  DR. [name] ALSO HAD ARTHROSCOPIC RIGHT KNEE IN  10/2001   TOTAL KNEE ARTHROPLASTY     ULNAR NERVE TRANSPOSITION Right 10/21/2013   Procedure: RIGHT ELBOW  ULNAR NERVE DECOMPRESSION;  Surgeon: Wynonia Sours, MD;  Location: [facility];  Service: Orthopedics;  Laterality: Right;   Social History   Occupational History   Occupation: Magazine features editor: UNEMPLOYED  Tobacco Use   Smoking status: Former    Packs/day: 2.00    Years: 35.00    Total pack years: 70.00    Types: Cigarettes    Quit date: 10/28/2011    Years since quitting: 10.9   Smokeless tobacco: Never  Vaping Use   Vaping Use: Some days   Substances: Nicotine  Substance and Sexual Activity   Alcohol use: Not Currently    Comment: "not in a long time"   Drug use: Never   Sexual activity: Yes    Birth control/protection: Implant    Comment: penile implant

## 2022-10-24 ENCOUNTER — Encounter (HOSPITAL_COMMUNITY): Payer: Self-pay

## 2022-10-24 DIAGNOSIS — B354 Tinea corporis: Secondary | ICD-10-CM | POA: Diagnosis not present

## 2022-10-24 DIAGNOSIS — L259 Unspecified contact dermatitis, unspecified cause: Secondary | ICD-10-CM | POA: Diagnosis not present

## 2022-10-24 DIAGNOSIS — L299 Pruritus, unspecified: Secondary | ICD-10-CM | POA: Diagnosis not present

## 2022-10-24 DIAGNOSIS — I10 Essential (primary) hypertension: Secondary | ICD-10-CM | POA: Diagnosis not present

## 2022-10-25 DIAGNOSIS — D509 Iron deficiency anemia, unspecified: Secondary | ICD-10-CM | POA: Diagnosis not present

## 2022-10-25 DIAGNOSIS — N2581 Secondary hyperparathyroidism of renal origin: Secondary | ICD-10-CM | POA: Diagnosis not present

## 2022-10-25 DIAGNOSIS — Z992 Dependence on renal dialysis: Secondary | ICD-10-CM | POA: Diagnosis not present

## 2022-10-25 DIAGNOSIS — E875 Hyperkalemia: Secondary | ICD-10-CM | POA: Diagnosis not present

## 2022-10-25 DIAGNOSIS — D631 Anemia in chronic kidney disease: Secondary | ICD-10-CM | POA: Diagnosis not present

## 2022-10-25 DIAGNOSIS — N186 End stage renal disease: Secondary | ICD-10-CM | POA: Diagnosis not present

## 2022-10-26 DIAGNOSIS — Z4781 Encounter for orthopedic aftercare following surgical amputation: Secondary | ICD-10-CM | POA: Diagnosis not present

## 2022-10-26 DIAGNOSIS — Z89511 Acquired absence of right leg below knee: Secondary | ICD-10-CM | POA: Diagnosis not present

## 2022-10-26 DIAGNOSIS — Z741 Need for assistance with personal care: Secondary | ICD-10-CM | POA: Diagnosis not present

## 2022-10-26 DIAGNOSIS — M6281 Muscle weakness (generalized): Secondary | ICD-10-CM | POA: Diagnosis not present

## 2022-10-26 DIAGNOSIS — Z89512 Acquired absence of left leg below knee: Secondary | ICD-10-CM | POA: Diagnosis not present

## 2022-10-26 DIAGNOSIS — R2681 Unsteadiness on feet: Secondary | ICD-10-CM | POA: Diagnosis not present

## 2022-10-27 DIAGNOSIS — Z992 Dependence on renal dialysis: Secondary | ICD-10-CM | POA: Diagnosis not present

## 2022-10-27 DIAGNOSIS — N2581 Secondary hyperparathyroidism of renal origin: Secondary | ICD-10-CM | POA: Diagnosis not present

## 2022-10-27 DIAGNOSIS — D631 Anemia in chronic kidney disease: Secondary | ICD-10-CM | POA: Diagnosis not present

## 2022-10-27 DIAGNOSIS — N186 End stage renal disease: Secondary | ICD-10-CM | POA: Diagnosis not present

## 2022-10-27 DIAGNOSIS — D509 Iron deficiency anemia, unspecified: Secondary | ICD-10-CM | POA: Diagnosis not present

## 2022-10-27 DIAGNOSIS — E875 Hyperkalemia: Secondary | ICD-10-CM | POA: Diagnosis not present

## 2022-10-29 ENCOUNTER — Ambulatory Visit: Payer: Medicare HMO | Admitting: Cardiology

## 2022-10-29 ENCOUNTER — Encounter: Payer: Self-pay | Admitting: Cardiology

## 2022-10-29 VITALS — BP 139/62 | HR 68 | Resp 16 | Ht 74.0 in | Wt 248.0 lb

## 2022-10-29 DIAGNOSIS — M19049 Primary osteoarthritis, unspecified hand: Secondary | ICD-10-CM

## 2022-10-29 DIAGNOSIS — I2510 Atherosclerotic heart disease of native coronary artery without angina pectoris: Secondary | ICD-10-CM

## 2022-10-29 DIAGNOSIS — Z89512 Acquired absence of left leg below knee: Secondary | ICD-10-CM | POA: Diagnosis not present

## 2022-10-29 DIAGNOSIS — I480 Paroxysmal atrial fibrillation: Secondary | ICD-10-CM | POA: Diagnosis not present

## 2022-10-29 DIAGNOSIS — R2681 Unsteadiness on feet: Secondary | ICD-10-CM | POA: Diagnosis not present

## 2022-10-29 DIAGNOSIS — I951 Orthostatic hypotension: Secondary | ICD-10-CM

## 2022-10-29 DIAGNOSIS — M1990 Unspecified osteoarthritis, unspecified site: Secondary | ICD-10-CM | POA: Diagnosis not present

## 2022-10-29 DIAGNOSIS — Z741 Need for assistance with personal care: Secondary | ICD-10-CM | POA: Diagnosis not present

## 2022-10-29 DIAGNOSIS — Z992 Dependence on renal dialysis: Secondary | ICD-10-CM | POA: Diagnosis not present

## 2022-10-29 DIAGNOSIS — N186 End stage renal disease: Secondary | ICD-10-CM

## 2022-10-29 DIAGNOSIS — Z89511 Acquired absence of right leg below knee: Secondary | ICD-10-CM | POA: Diagnosis not present

## 2022-10-29 DIAGNOSIS — Z4781 Encounter for orthopedic aftercare following surgical amputation: Secondary | ICD-10-CM | POA: Diagnosis not present

## 2022-10-29 DIAGNOSIS — M6281 Muscle weakness (generalized): Secondary | ICD-10-CM | POA: Diagnosis not present

## 2022-10-29 DIAGNOSIS — G8929 Other chronic pain: Secondary | ICD-10-CM | POA: Diagnosis not present

## 2022-10-29 MED ORDER — TRAMADOL HCL 50 MG PO TABS: 50.0000 mg | ORAL_TABLET | Freq: Three times a day (TID) | ORAL | 0 refills | Status: DC | PRN

## 2022-10-29 MED ORDER — AMIODARONE HCL 200 MG PO TABS: 200.0000 mg | ORAL_TABLET | Freq: Every day | ORAL | 1 refills | Status: DC

## 2022-10-29 NOTE — Progress Notes (Signed)
Primary Physician/Referring:  [name], PA-C  Patient ID: [name], male    DOB: 09/23/1955, 67 y.o.   MRN: [id number]  Chief Complaint  Patient presents with   Coronary Artery Disease   Atrial Fibrillation   Follow-up   HPI:    [name]  is a 67 y.o. Caucasian male patient with severe peripheral arterial disease and has history of bilateral below-knee amputation, multiple peripheral interventions in the past, uncontrolled diabetes mellitus, ESRD, severe diabetic retinopathy, coronary artery disease angioplasty to the right coronary artery on 10/29/2014, right carotid endarterectomy in 2012 , OSA Unable to tolerate CPAP, chronic back pain and neck and back surgery in past and on chronic pain medications, paroxysmal atrial fibrillation on Eliquis.  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  He is presently in palliative care. Uunfortunately continues to smoke e-cigarettes, but has reduced this significantly.   Social History   Tobacco Use   Smoking status: Former    Packs/day: 2.00    Years: 35.00    Total pack years: 70.00    Types: Cigarettes    Quit date: 10/28/2011    Years since quitting: 11.0   Smokeless tobacco: Never  Substance Use Topics   Alcohol use: Not Currently    Comment: "not in a long time"   Marital Status: Married   ROS  Review of Systems  Cardiovascular:  Negative for chest pain, dyspnea on exertion and palpitations.  Neurological:  Negative for dizziness.   Objective      10/29/2022    2:23 PM 07/16/2022    7:41 PM 07/16/2022   12:11 PM  Vitals with BMI  Height 6' 2"$     Weight 248 lbs    BMI [id number]    Systolic [id number]  Diastolic 62 55 61  Pulse 68 72 72    Blood pressure 139/62, pulse 68, resp. rate 16, height 6' 2"$  (1.88 m), weight 248 lb (112.5 kg), SpO2 96 %. Body mass index is 31.84 kg/m.   Physical Exam Vitals reviewed.  Constitutional:      Appearance: He is obese.      Comments: Well built and moderately obese in no acute distress  Neck:     Vascular: Carotid bruit (bilateral) present. No JVD.  Cardiovascular:     Rate and Rhythm: Normal rate and regular rhythm.     Pulses:          Femoral pulses are 1+ on the right side with bruit and 1+ on the left side with bruit.      Popliteal pulses are 0 on the right side and 0 on the left side.        Right dorsalis pedis pulse not accessible and left dorsalis pedis pulse not accessible.     Heart sounds: Normal heart sounds. No murmur heard.    No gallop.     Comments: Left brachial artery AV Shunt noted Pulmonary:     Effort: Pulmonary effort is normal.     Breath sounds: Normal breath sounds.  Abdominal:     General: Abdomen is flat.     Palpations: Abdomen is soft.  Musculoskeletal:        General: Deformity (bilataral BKA) present.     Cervical back: Neck supple.    Laboratory examination:   Recent Labs    07/06/22 0501 07/09/22 0315 07/14/22 0803  NA 135 134* 130*  K 3.8 4.2 4.5  CL  94* 93* 90*  CO2 27 26 24  $ GLUCOSE 238* 172* 193*  BUN 31* 43* 76*  CREATININE 6.25* 6.57* 8.68*  CALCIUM 7.2* 7.7* 8.5*  GFRNONAA 9* 9* 6*      Latest Ref Rng & Units 07/14/2022    8:03 AM 07/09/2022    3:15 AM 07/06/2022    5:01 AM  CMP  Glucose 70 - 99 mg/dL 193  172  238   BUN 8 - 23 mg/dL 76  43  31   Creatinine 0.61 - 1.24 mg/dL 8.68  6.57  6.25   Sodium 135 - 145 mmol/L 130  134  135   Potassium 3.5 - 5.1 mmol/L 4.5  4.2  3.8   Chloride 98 - 111 mmol/L 90  93  94   CO2 22 - 32 mmol/L 24  26  27   $ Calcium 8.9 - 10.3 mg/dL 8.5  7.7  7.2       Latest Ref Rng & Units 07/14/2022    8:03 AM 07/06/2022    5:01 AM 07/05/2022    3:52 AM  CBC  WBC 4.0 - 10.5 K/uL 7.0  8.3  8.4   Hemoglobin 13.0 - 17.0 g/dL 8.9  8.8  8.4   Hematocrit 39.0 - 52.0 % 26.6  25.7  24.6   Platelets 150 - 400 K/uL 276  205  229    Lipid Panel     Component Value Date/Time   CHOL 117 05/30/2020 1646   TRIG 133  05/30/2020 1646   HDL 24 (L) 05/30/2020 1646   LDLCALC 69 05/30/2020 1646   HEMOGLOBIN A1C Lab Results  Component Value Date   HGBA1C 7.6 (H) 01/11/2022   MPG 171.42 01/11/2022   Allergies   Allergies  Allergen Reactions   Contrast Media [Iodinated Contrast Media] Shortness Of Breath and Other (See Comments)    Difficulty breathing and altered mental status     Ivp Dye [Iodinated Contrast Media] Anaphylaxis, Shortness Of Breath and Other (See Comments)    Breathing problems, altered mental state    Adhesive [Tape] Rash and Other (See Comments)    Rash after 1 day of use Do not leave on longer then 3 days with out be changed   Latex Rash and Other (See Comments)    A severe rash appears after the first 24 hours of being placed    Final Medications at End of Visit     Current Outpatient Medications:    amLODipine (NORVASC) 5 MG tablet, Take 5 mg by mouth daily as needed for dizziness. Take one tablet for BP > 160 mm Hg, Disp: , Rfl:    apixaban (ELIQUIS) 5 MG TABS tablet, Take 1 tablet (5 mg total) by mouth 2 (two) times daily., Disp: 60 tablet, Rfl: 1   atorvastatin (LIPITOR) 40 MG tablet, Take 40 mg by mouth at bedtime. , Disp: , Rfl:    B Complex-C-Zn-Folic Acid (DIALYVITE/ZINC) TABS, Take 1 tablet by mouth Every Tuesday,Thursday,and Saturday with dialysis., Disp: , Rfl:    famotidine (PEPCID) 20 MG tablet, Take 20 mg by mouth every other day., Disp: , Rfl:    fentaNYL (DURAGESIC) 50 MCG/HR, Place 1 patch onto the skin every 3 (three) days., Disp: 5 patch, Rfl: 0   folic acid (FOLVITE) 1 MG tablet, Take 1 mg by mouth daily., Disp: , Rfl:    Glucosamine HCl (GLUCOSAMINE PO), Take 1 tablet by mouth 2 (two) times daily., Disp: , Rfl:    insulin glargine (LANTUS)  100 UNIT/ML injection, Inject 0.2 mLs (20 Units total) into the skin at bedtime. (Patient taking differently: Inject 30 Units into the skin at bedtime.), Disp: 10 mL, Rfl: 0   insulin lispro (HUMALOG) 100 UNIT/ML  injection, Inject 4-13 Units into the skin 3 (three) times daily before meals. Only with meals if needed, Disp: , Rfl:    ketoconazole (NIZORAL) 2 % cream, Apply 1 application topically 2 (two) times daily as needed for irritation. , Disp: , Rfl: 1   lidocaine-prilocaine (EMLA) cream, Apply 1 application topically See admin instructions. Prior to Dialysis days Tuesday,Thursday and saturday, Disp: , Rfl:    loratadine (CLARITIN) 10 MG tablet, Take 10 mg by mouth daily., Disp: , Rfl:    metoCLOPramide (REGLAN) 5 MG tablet, Take 1 tablet (5 mg total) by mouth 4 (four) times daily -  before meals and at bedtime. (Patient taking differently: Take 5 mg by mouth 2 (two) times daily.), Disp: 360 tablet, Rfl: 1   midodrine (PROAMATINE) 10 MG tablet, Take 1 tablet (10 mg total) by mouth every other day. One hour before dialysis (Patient taking differently: Take 10 mg by mouth Every Tuesday,Thursday,and Saturday with dialysis. One hour before dialysis), Disp: 30 tablet, Rfl: 3   NEEDLE, REUSABLE, 22 G 22G X 1-1/2" MISC, 1 Units by Does not apply route as directed. For B12 IM inj, Disp: 10 each, Rfl: 0   nitroGLYCERIN (NITROSTAT) 0.4 MG SL tablet, Place 0.4 mg under the tongue every 5 (five) minutes as needed for chest pain., Disp: , Rfl:    NOVOLOG FLEXPEN 100 UNIT/ML FlexPen, Inject 4 Units into the skin 3 (three) times daily., Disp: 15 mL, Rfl: 11   omega-3 acid ethyl esters (LOVAZA) 1 g capsule, Take 2 g by mouth 2 (two) times daily. Mini, Disp: , Rfl:    ondansetron (ZOFRAN-ODT) 4 MG disintegrating tablet, Take 1 tablet by mouth 4 (four) times daily as needed for nausea/vomiting., Disp: , Rfl:    oxycodone-acetaminophen (LYNOX) 5-300 MG tablet, Take 1 tablet by mouth every 4 (four) hours as needed for pain., Disp: , Rfl:    oxyCODONE-acetaminophen (PERCOCET) 7.5-325 MG tablet, Take 1 tablet by mouth every 4 (four) hours as needed for severe pain., Disp: 20 tablet, Rfl: 0   pantoprazole (PROTONIX) 40 MG tablet,  Take 1 tablet (40 mg total) by mouth 2 (two) times daily for 30 days, THEN 1 tablet (40 mg total) daily. (Patient taking differently:  1 tablet (40 mg total) daily.), Disp: 120 tablet, Rfl: 0   Polyvinyl Alcohol-Povidone PF 1.4-0.6 % SOLN, Place 1 drop into both eyes 2 (two) times daily as needed (dry eyes). , Disp: , Rfl:    pregabalin (LYRICA) 100 MG capsule, Take 100 mg by mouth 2 (two) times daily., Disp: , Rfl:    rOPINIRole (REQUIP) 2 MG tablet, Take 2 mg by mouth at bedtime., Disp: , Rfl:    Syringe/Needle, Disp, (SYRINGE 3CC/22GX1-1/2") 22G X 1-1/2" 3 ML MISC, 1 Syringe by Does not apply route as directed. For b12 IM inj, Disp: 10 each, Rfl: 0   tamsulosin (FLOMAX) 0.4 MG CAPS capsule, Take 0.4 mg by mouth daily., Disp: , Rfl:    traMADol (ULTRAM) 50 MG tablet, Take 1 tablet (50 mg total) by mouth 3 (three) times daily as needed., Disp: 60 tablet, Rfl: 0   amiodarone (PACERONE) 200 MG tablet, Take 1 tablet (200 mg total) by mouth daily., Disp: 100 tablet, Rfl: 1   Cardiac Studies:   Lexiscan sestamibi  stress test 03/18/2017: 1. The resting electrocardiogram demonstrated normal sinus rhythm, RBBB and no resting arrhythmias. Stress EKG is non-diagnostic for ischemia as it a pharmacologic stress using Lexiscan. Occasional PVC noted. Stress symptoms included dyspnea, dizziness and chest pain. 2. The LV is dilated both at rest and stress images. The LV end diastolic volume was 123456. Perfusion images reveal inferior soft tissue attenuation artifact. There is no ischemia or scar. Overall left ventricular systolic function was normal without regional wall motion abnormalities. The left ventricular ejection fraction was calculated to be 51%. This is a low risk study. Compared to the study done on 10/15/2014, inferior wall ischemia and associated inferior hypokinesis is no longer present.  Coronary angiogram 09/17/18: Widely patent stent stents. Normal LVEF. Coronary Angiogram 10/29/2014: CAD s/p stenting of  the distal, mid and proximal RCA with implantation of 3 overlapping drug-eluting stent, from distal to proximal 2.5 x 38 mm, 2.75 x 38 mm and a 2.75 x 20 mm Promus premier DES.   Carotid artery duplex  06/09/2019:  Minimal stenosis in bilateral ICA of 1-15%. Heterogeneous plaque. Stenosis  in the right external carotid artery (<50%).  Left carotid endarterectomy site is patent.  Right vertebral artery flow is not well visualized. Antegrade left  vertebral artery flow.  No significant change from 03/01/2017. Follow up studies when clinically  Indicated.  Echocardiogram 06/17/2021: 1. Left ventricular ejection fraction, by estimation, is 55 to 60%. The left ventricle has normal function. The left ventricle has no regional wall motion abnormalities. There is mild concentric left ventricular hypertrophy. Left ventricular diastolic parameters are indeterminate.   2. Right ventricular systolic function is normal. The right ventricular size is normal.   3. Left atrial size was moderately dilated.   4. The mitral valve is normal in structure. No evidence of mitral valve regurgitation. Moderate mitral annular calcification.   5. The aortic valve is tricuspid. Aortic valve regurgitation is not visualized. Mild to moderate aortic valve sclerosis/calcification is present, without any evidence of aortic stenosis.  EKG:   EKG 10/29/2022: Sinus rhythm with first-degree AV block at rate of 68 beats minute, left axis deviation, left anterior fascicular block.  Right bundle branch block.  Trifascicular block.  Compared to 04/27/2022, first-degree AV block is new.  06/12/2021: Atypical atrial flutter with RVR at the rate of 106 bpm, left axis deviation, left anterior fascicular block.  Right bundle branch block.  Nonspecific T abnormality.   Assessment     ICD-10-CM   1. Paroxysmal atrial fibrillation with RVR (HCC)  I48.0 EKG 12-Lead    amiodarone (PACERONE) 200 MG tablet    2. Coronary artery disease  involving native coronary artery of native heart without angina pectoris  I25.10     3. Orthostatic hypotension  I95.1     4. ESRD on hemodialysis (HCC)  N18.6    Z99.2     5. Hand arthritis  M19.049 traMADol (ULTRAM) 50 MG tablet       Meds ordered this encounter  Medications   amiodarone (PACERONE) 200 MG tablet    Sig: Take 1 tablet (200 mg total) by mouth daily.    Dispense:  100 tablet    Refill:  1   traMADol (ULTRAM) 50 MG tablet    Sig: Take 1 tablet (50 mg total) by mouth 3 (three) times daily as needed.    Dispense:  60 tablet    Refill:  0    Medications Discontinued During This Encounter  Medication Reason   amiodarone (  PACERONE) 200 MG tablet Reorder  CHA2DS2-VASc Score is 4.  Yearly risk of stroke: 5% (A, HTN, Vasc Dz).  Score of 1=0.6; 2=2.2; 3=3.2; 4=4.8; 5=7.2; 6=9.8; 7=>9.8) -(CHF; HTN; vasc disease DM,  Male = 1; Age <65 =0; 65-74 = 1,  >75 =2; stroke/embolism= 2).     Recommendations:   [name]  is a 67 y.o. Caucasian male patient with severe peripheral arterial disease and has history of bilateral below-knee amputation, multiple peripheral interventions in the past, uncontrolled diabetes mellitus, ESRD, severe diabetic retinopathy, coronary artery disease angioplasty to the right coronary artery on 10/29/2014, right carotid endarterectomy in 2012 , OSA Unable to tolerate CPAP, chronic back pain and neck and back surgery in past and on chronic pain medications, paroxysmal atrial fibrillation on Eliquis.  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  He is presently in palliative care. Uunfortunately continues to smoke e-cigarettes, but has reduced this significantly.  1. Paroxysmal atrial fibrillation with RVR ([place]) He is maintaining sinus rhythm, he is also tolerating anticoagulation without bleeding diathesis.  He is going through dialysis without any difficulty and is maintaining blood pressure and has  been using midodrine as needed.  Presently on amiodarone 200 mg p.o. twice daily.  Will reduce the dose to 200 mg daily.  2. Coronary artery disease involving native coronary artery of native heart without angina pectoris No recurrence of angina pectoris.  No change in the EKG.  Except for development of first-degree AV block which is new.  3. Orthostatic hypotension Patient is presently doing well and has not had any dizziness or syncope, his physical activity is very limited and is presently in a rehab facility which he plans to go back home.  4. ESRD on hemodialysis ([place]) On hemodialysis without any complications and has not had any difficulty with hemodialysis either.  I will see him back in 6 months for follow-up.  Patient has developed severe arthritis involving his hands, I prescribed Ultram to be used on a as needed basis.  Patient is already on high doses of fentanyl.  I do not want to use Neurontin in view of orthostatic hypotension.    Adrian Prows, MD, [facility] 10/29/2022, 3:19 PM Office: [phone] Fax: [phone] Pager: [phone]

## 2022-10-30 ENCOUNTER — Other Ambulatory Visit: Payer: Self-pay

## 2022-10-30 DIAGNOSIS — I10 Essential (primary) hypertension: Secondary | ICD-10-CM | POA: Diagnosis not present

## 2022-10-30 DIAGNOSIS — M1993 Secondary osteoarthritis, unspecified site: Secondary | ICD-10-CM | POA: Diagnosis not present

## 2022-10-30 DIAGNOSIS — N186 End stage renal disease: Secondary | ICD-10-CM | POA: Diagnosis not present

## 2022-10-30 DIAGNOSIS — Z4781 Encounter for orthopedic aftercare following surgical amputation: Secondary | ICD-10-CM | POA: Diagnosis not present

## 2022-10-30 DIAGNOSIS — D509 Iron deficiency anemia, unspecified: Secondary | ICD-10-CM | POA: Diagnosis not present

## 2022-10-30 DIAGNOSIS — R2681 Unsteadiness on feet: Secondary | ICD-10-CM | POA: Diagnosis not present

## 2022-10-30 DIAGNOSIS — I482 Chronic atrial fibrillation, unspecified: Secondary | ICD-10-CM | POA: Diagnosis not present

## 2022-10-30 DIAGNOSIS — I5032 Chronic diastolic (congestive) heart failure: Secondary | ICD-10-CM | POA: Diagnosis not present

## 2022-10-30 DIAGNOSIS — E875 Hyperkalemia: Secondary | ICD-10-CM | POA: Diagnosis not present

## 2022-10-30 DIAGNOSIS — Z89511 Acquired absence of right leg below knee: Secondary | ICD-10-CM | POA: Diagnosis not present

## 2022-10-30 DIAGNOSIS — N2581 Secondary hyperparathyroidism of renal origin: Secondary | ICD-10-CM | POA: Diagnosis not present

## 2022-10-30 DIAGNOSIS — M6281 Muscle weakness (generalized): Secondary | ICD-10-CM | POA: Diagnosis not present

## 2022-10-30 DIAGNOSIS — Z89512 Acquired absence of left leg below knee: Secondary | ICD-10-CM | POA: Diagnosis not present

## 2022-10-30 DIAGNOSIS — Z992 Dependence on renal dialysis: Secondary | ICD-10-CM | POA: Diagnosis not present

## 2022-10-30 DIAGNOSIS — Z741 Need for assistance with personal care: Secondary | ICD-10-CM | POA: Diagnosis not present

## 2022-10-30 DIAGNOSIS — D631 Anemia in chronic kidney disease: Secondary | ICD-10-CM | POA: Diagnosis not present

## 2022-10-31 DIAGNOSIS — Z89511 Acquired absence of right leg below knee: Secondary | ICD-10-CM | POA: Diagnosis not present

## 2022-10-31 DIAGNOSIS — Z741 Need for assistance with personal care: Secondary | ICD-10-CM | POA: Diagnosis not present

## 2022-10-31 DIAGNOSIS — Z89512 Acquired absence of left leg below knee: Secondary | ICD-10-CM | POA: Diagnosis not present

## 2022-10-31 DIAGNOSIS — R2681 Unsteadiness on feet: Secondary | ICD-10-CM | POA: Diagnosis not present

## 2022-10-31 DIAGNOSIS — Z13818 Encounter for screening for other digestive system disorders: Secondary | ICD-10-CM | POA: Diagnosis not present

## 2022-10-31 DIAGNOSIS — I10 Essential (primary) hypertension: Secondary | ICD-10-CM | POA: Diagnosis not present

## 2022-10-31 DIAGNOSIS — Z1389 Encounter for screening for other disorder: Secondary | ICD-10-CM | POA: Diagnosis not present

## 2022-10-31 DIAGNOSIS — R945 Abnormal results of liver function studies: Secondary | ICD-10-CM | POA: Diagnosis not present

## 2022-10-31 DIAGNOSIS — E113512 Type 2 diabetes mellitus with proliferative diabetic retinopathy with macular edema, left eye: Secondary | ICD-10-CM | POA: Diagnosis not present

## 2022-10-31 DIAGNOSIS — Z4781 Encounter for orthopedic aftercare following surgical amputation: Secondary | ICD-10-CM | POA: Diagnosis not present

## 2022-10-31 DIAGNOSIS — Z1329 Encounter for screening for other suspected endocrine disorder: Secondary | ICD-10-CM | POA: Diagnosis not present

## 2022-10-31 DIAGNOSIS — M6281 Muscle weakness (generalized): Secondary | ICD-10-CM | POA: Diagnosis not present

## 2022-11-01 DIAGNOSIS — D631 Anemia in chronic kidney disease: Secondary | ICD-10-CM | POA: Diagnosis not present

## 2022-11-01 DIAGNOSIS — D509 Iron deficiency anemia, unspecified: Secondary | ICD-10-CM | POA: Diagnosis not present

## 2022-11-01 DIAGNOSIS — Z992 Dependence on renal dialysis: Secondary | ICD-10-CM | POA: Diagnosis not present

## 2022-11-01 DIAGNOSIS — E875 Hyperkalemia: Secondary | ICD-10-CM | POA: Diagnosis not present

## 2022-11-01 DIAGNOSIS — N186 End stage renal disease: Secondary | ICD-10-CM | POA: Diagnosis not present

## 2022-11-01 DIAGNOSIS — N2581 Secondary hyperparathyroidism of renal origin: Secondary | ICD-10-CM | POA: Diagnosis not present

## 2022-11-02 DIAGNOSIS — Z4781 Encounter for orthopedic aftercare following surgical amputation: Secondary | ICD-10-CM | POA: Diagnosis not present

## 2022-11-02 DIAGNOSIS — Z741 Need for assistance with personal care: Secondary | ICD-10-CM | POA: Diagnosis not present

## 2022-11-02 DIAGNOSIS — R2681 Unsteadiness on feet: Secondary | ICD-10-CM | POA: Diagnosis not present

## 2022-11-02 DIAGNOSIS — Z89512 Acquired absence of left leg below knee: Secondary | ICD-10-CM | POA: Diagnosis not present

## 2022-11-02 DIAGNOSIS — Z89511 Acquired absence of right leg below knee: Secondary | ICD-10-CM | POA: Diagnosis not present

## 2022-11-02 DIAGNOSIS — M6281 Muscle weakness (generalized): Secondary | ICD-10-CM | POA: Diagnosis not present

## 2022-11-03 DIAGNOSIS — R2681 Unsteadiness on feet: Secondary | ICD-10-CM | POA: Diagnosis not present

## 2022-11-03 DIAGNOSIS — Z741 Need for assistance with personal care: Secondary | ICD-10-CM | POA: Diagnosis not present

## 2022-11-03 DIAGNOSIS — N186 End stage renal disease: Secondary | ICD-10-CM | POA: Diagnosis not present

## 2022-11-03 DIAGNOSIS — Z89512 Acquired absence of left leg below knee: Secondary | ICD-10-CM | POA: Diagnosis not present

## 2022-11-03 DIAGNOSIS — E875 Hyperkalemia: Secondary | ICD-10-CM | POA: Diagnosis not present

## 2022-11-03 DIAGNOSIS — N2581 Secondary hyperparathyroidism of renal origin: Secondary | ICD-10-CM | POA: Diagnosis not present

## 2022-11-03 DIAGNOSIS — Z4781 Encounter for orthopedic aftercare following surgical amputation: Secondary | ICD-10-CM | POA: Diagnosis not present

## 2022-11-03 DIAGNOSIS — D631 Anemia in chronic kidney disease: Secondary | ICD-10-CM | POA: Diagnosis not present

## 2022-11-03 DIAGNOSIS — Z992 Dependence on renal dialysis: Secondary | ICD-10-CM | POA: Diagnosis not present

## 2022-11-03 DIAGNOSIS — M6281 Muscle weakness (generalized): Secondary | ICD-10-CM | POA: Diagnosis not present

## 2022-11-03 DIAGNOSIS — Z89511 Acquired absence of right leg below knee: Secondary | ICD-10-CM | POA: Diagnosis not present

## 2022-11-03 DIAGNOSIS — D509 Iron deficiency anemia, unspecified: Secondary | ICD-10-CM | POA: Diagnosis not present

## 2022-11-05 ENCOUNTER — Ambulatory Visit (INDEPENDENT_AMBULATORY_CARE_PROVIDER_SITE_OTHER): Payer: Medicare HMO | Admitting: Orthopedic Surgery

## 2022-11-05 DIAGNOSIS — Z89512 Acquired absence of left leg below knee: Secondary | ICD-10-CM | POA: Diagnosis not present

## 2022-11-05 DIAGNOSIS — S70312A Abrasion, left thigh, initial encounter: Secondary | ICD-10-CM | POA: Diagnosis not present

## 2022-11-06 ENCOUNTER — Encounter: Payer: Self-pay | Admitting: Orthopedic Surgery

## 2022-11-06 DIAGNOSIS — Z741 Need for assistance with personal care: Secondary | ICD-10-CM | POA: Diagnosis not present

## 2022-11-06 DIAGNOSIS — Z4781 Encounter for orthopedic aftercare following surgical amputation: Secondary | ICD-10-CM | POA: Diagnosis not present

## 2022-11-06 DIAGNOSIS — G8929 Other chronic pain: Secondary | ICD-10-CM | POA: Diagnosis not present

## 2022-11-06 DIAGNOSIS — E875 Hyperkalemia: Secondary | ICD-10-CM | POA: Diagnosis not present

## 2022-11-06 DIAGNOSIS — M1990 Unspecified osteoarthritis, unspecified site: Secondary | ICD-10-CM | POA: Diagnosis not present

## 2022-11-06 DIAGNOSIS — Z89512 Acquired absence of left leg below knee: Secondary | ICD-10-CM | POA: Diagnosis not present

## 2022-11-06 DIAGNOSIS — R2681 Unsteadiness on feet: Secondary | ICD-10-CM | POA: Diagnosis not present

## 2022-11-06 DIAGNOSIS — Z89511 Acquired absence of right leg below knee: Secondary | ICD-10-CM | POA: Diagnosis not present

## 2022-11-06 DIAGNOSIS — N2581 Secondary hyperparathyroidism of renal origin: Secondary | ICD-10-CM | POA: Diagnosis not present

## 2022-11-06 DIAGNOSIS — D631 Anemia in chronic kidney disease: Secondary | ICD-10-CM | POA: Diagnosis not present

## 2022-11-06 DIAGNOSIS — M6281 Muscle weakness (generalized): Secondary | ICD-10-CM | POA: Diagnosis not present

## 2022-11-06 DIAGNOSIS — N186 End stage renal disease: Secondary | ICD-10-CM | POA: Diagnosis not present

## 2022-11-06 DIAGNOSIS — Z992 Dependence on renal dialysis: Secondary | ICD-10-CM | POA: Diagnosis not present

## 2022-11-06 DIAGNOSIS — D509 Iron deficiency anemia, unspecified: Secondary | ICD-10-CM | POA: Diagnosis not present

## 2022-11-06 NOTE — Progress Notes (Signed)
Office Visit Note   Patient: [name]           Date of Birth: 08-Aug-1956           MRN: [id number] Visit Date: 11/05/2022              Requested by: [name], PA-C [address] [place],  [place] [address] PCP: [name], PA-C  Chief Complaint  Patient presents with   Left Leg - Wound Check    Hx left BKA      HPI: Patient is a 67 year old gentleman who is seen for ulcerations and abrasions of the left residual limb status post left BKA.  Patient has bilateral transtibial amputations.  Patient complains of abrasions medial left thigh  Assessment & Plan: Visit Diagnoses:  1. S/P BKA (below knee amputation) unilateral, left ([place])     Plan: Patient is provided prescription for [employer] to modify his socket.  Patient will need materials and supplies.  Patient will hold on using the prosthetic limb until the ulcer is healed.  He will continue with his stump shrinker.  Follow-Up Instructions: Return in about 4 weeks (around 12/03/2022).   Ortho Exam  Patient is alert, oriented, no adenopathy, well-dressed, normal affect, normal respiratory effort. Examination patient has an end bearing ulcer on the left below-knee amputation.  There are multiple ulcers over the medial left thigh the majority of these are healing nicely.  Patient is an existing left transtibial  amputee.  Patient's current comorbidities are not expected to impact the ability to function with the prescribed prosthesis. Patient verbally communicates a strong desire to use a prosthesis. Patient currently requires mobility aids to ambulate without a prosthesis.  Expects not to use mobility aids with a new prosthesis.  Patient is a K2 level ambulator that will use a prosthesis to walk around their home and the community over low level environmental barriers.      Imaging: No results found. No images are attached to the encounter.  Labs: Lab Results  Component Value Date   HGBA1C 7.6 (H)  01/11/2022   HGBA1C 7.9 (H) 06/14/2021   HGBA1C 5.7 (H) 09/16/2018   ESRSEDRATE 26 (H) 06/23/2022   ESRSEDRATE 40 (H) 01/13/2022   ESRSEDRATE 55 (H) 01/11/2022   CRP 14.0 (H) 06/23/2022   CRP 3.6 (H) 01/13/2022   CRP 4.2 (H) 01/11/2022   REPTSTATUS 06/30/2022 FINAL 06/25/2022   GRAMSTAIN  06/14/2021    NO ORGANISMS SEEN SQUAMOUS EPITHELIAL CELLS PRESENT ABUNDANT WBC PRESENT,BOTH PMN AND MONONUCLEAR MODERATE GRAM POSITIVE COCCI    CULT  06/25/2022    NO GROWTH 5 DAYS Performed at [facility], [place] [address], [place], [place] [address]    LABORGA GROUP B STREP(S.AGALACTIAE)ISOLATED 06/23/2022     Lab Results  Component Value Date   ALBUMIN 2.4 (L) 07/14/2022   ALBUMIN 2.3 (L) 07/09/2022   ALBUMIN 2.7 (L) 07/04/2022   PREALBUMIN 18 06/23/2022   PREALBUMIN 19.0 05/04/2016   PREALBUMIN 25.0 12/16/2015    Lab Results  Component Value Date   MG 1.5 (L) 06/29/2022   MG 1.5 (L) 06/28/2022   MG 1.8 01/14/2022   No results found for: "VD25OH"  Lab Results  Component Value Date   PREALBUMIN 18 06/23/2022   PREALBUMIN 19.0 05/04/2016   PREALBUMIN 25.0 12/16/2015      Latest Ref Rng & Units 07/14/2022    8:03 AM 07/06/2022    5:01 AM 07/05/2022    3:52 AM  CBC  EXTENDED  WBC 4.0 - 10.5 K/uL 7.0  8.3  8.4   RBC 4.22 - 5.81 MIL/uL 2.96  2.82  2.69   Hemoglobin 13.0 - 17.0 g/dL 8.9  8.8  8.4   HCT 39.0 - 52.0 % 26.6  25.7  24.6   Platelets 150 - 400 K/uL 276  205  229      There is no height or weight on file to calculate BMI.  Orders:  No orders of the defined types were placed in this encounter.  No orders of the defined types were placed in this encounter.    Procedures: No procedures performed  Clinical Data: No additional findings.  ROS:  All other systems negative, except as noted in the HPI. Review of Systems  Objective: Vital Signs: There were no vitals taken for this visit.  Specialty Comments:  No specialty comments available.  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 2 diabetes mellitus ([place]) 11/27/2021   Vitreous hemorrhage of right eye ([place]) 0000000   Acute metabolic encephalopathy [id number]   COVID 09/02/2021   Sepsis ([place]) 09/02/2021   Severe sepsis with lactic acidosis ([place]) 06/14/2021   Infection associated with implanted penile prosthesis ([place]) 06/14/2021   ESRD on hemodialysis ([place]) 06/14/2021   Paroxysmal atrial fibrillation with RVR ([place]) 06/14/2021   Carotid stenosis 06/15/2020   Dyslipidemia 06/15/2020   Diabetic macular edema of right eye with proliferative retinopathy associated with type 2 diabetes mellitus ([place]) 01/18/2020   Severe nonproliferative diabetic retinopathy of left eye, with macular edema, associated with type 2 diabetes mellitus ([place]) 01/04/2020   Retinal hemorrhage of right eye 01/04/2020   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   Pseudoclaudication 11/15/2018   Essential hypertension 09/18/2018   S/P BKA (below knee amputation), right ([place]) 09/18/2018   Amputation of toe of left foot ([place]) 09/18/2018   Peripheral artery disease ([place]) 09/18/2018   S/P carotid endarterectomy 09/18/2018   OSA not tolerating CPAP 09/18/2018   Chronic back pain 09/18/2018   Status post reversal of ileostomy 05/21/2018   Normocytic anemia 02/14/2018   Intra-abdominal abscess ([place]) 11/04/2017   Chronic venous hypertension (idiopathic)  with ulcer and inflammation of left lower extremity ([place]) 12/11/2016   PAD (peripheral artery disease) ([place]) 11/12/2016   Unilateral primary osteoarthritis, left knee 10/11/2016   History of right below knee amputation ([place]) 07/12/2016   Infected diabetic left foot ulcer with chronic osteomyelitis    Diabetic foot infection ([place]) 05/04/2016   Insulin dependent type 2 diabetes mellitus ([place]) 12/16/2015   Amputated toe ([place]) 10/21/2015   Cellulitis of left lower extremity 09/03/2015   Leg edema, left 09/03/2015   CAD (dz of distal, mid and proximal RCA with implantation of 3 overlapping drug-eluting stent,) 09/03/2015   Chronic diastolic heart failure, NYHA class 2 ([place]) 09/03/2015   Angina pectoris associated with type 2 diabetes mellitus ([place]) 10/28/2014   Hyperlipidemia 08/25/2014   Limb pain 03/20/2013   DJD (degenerative joint disease) 09/25/2012  Migraine 09/25/2012   Neuropathy 09/25/2012   Restless legs syndrome (RLS) 09/25/2012   Chronic obstructive pulmonary disease, unspecified ([place]) 04/25/2012   Unknown cause of morbidity or mortality 04/25/2012   Chronic total occlusion of artery of the extremities ([place]) 04/08/2012   Onychomycosis 02/01/2012   Occlusion and stenosis of carotid artery without mention of cerebral infarction 06/12/2011   GERD (gastroesophageal reflux disease) 05/08/2011   Barrett's esophagus without dysplasia 05/08/2011   Former tobacco use 02/01/2011   Past Medical History:  Diagnosis Date   Carotid artery occlusion 11/10/10   LEFT CAROTID ENDARTERECTOMY   Chronic kidney disease    Complication of anesthesia    BP WENT UP AT [facility] "   COPD (chronic obstructive pulmonary disease) ([place])    pt denies this dx as of 06/01/20 - no inhaler    Diabetes mellitus without complication ([place])    Diverticulitis    Diverticulosis of colon (without mention of hemorrhage)    DJD (degenerative joint disease)    knees/hands/feet/back/neck   Fatty liver    Full dentures     GERD (gastroesophageal reflux disease)    H/O hiatal hernia    History of blood transfusion    with a past surical procedure per patient 06/01/20   Hyperlipidemia    Hypertension    Neuromuscular disorder ([place])    peripheral neuropathy   Non-pressure chronic ulcer of other part of left foot limited to breakdown of skin ([place]) 11/12/2016   Osteomyelitis ([facility])    left 5th metatarsal   PAD (peripheral artery disease) ([place])    Distal aortogram June 2012. Atherectomy left popliteal artery July 2012.    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 Before age 7   Hyperlipidemia Brother    Hypertension Brother    Hypertension Son    Arthritis Other        GRANDMOTHER   Hypertension Other        OTHER FAMILY MEMBERS    Past Surgical History:  Procedure Laterality Date   ABDOMINAL AORTOGRAM W/LOWER EXTREMITY N/A 06/23/2021   Procedure: ABDOMINAL AORTOGRAM W/LOWER EXTREMITY;  Surgeon: Nigel Mormon, MD;  Location: [facility];  Service: Cardiovascular;  Laterality: N/A;   AMPUTATION  11/05/2011   Procedure: AMPUTATION RAY;  Surgeon: Wylene Simmer, MD;  Location: [facility];  Service: Orthopedics;  Laterality: Right;  Amputation of Right 4&5th Toes   AMPUTATION Left 11/26/2012   Procedure: AMPUTATION RAY;  Surgeon: Wylene Simmer, MD;  Location:  [facility];  Service: Orthopedics;  Laterality: Left;  fourth ray amputation   AMPUTATION Right 08/27/2014   Procedure: Transmetatarsal Amputation;  Surgeon: Newt Minion, MD;  Location: [facility];  Service: Orthopedics;  Laterality: Right;   AMPUTATION Right 01/14/2015   Procedure: AMPUTATION BELOW KNEE;  Surgeon: Newt Minion, MD;  Location: [place];  Service: Orthopedics;  Laterality: Right;   AMPUTATION Left 10/21/2015   Procedure: Left Foot 5th Ray Amputation;  Surgeon: Newt Minion, MD;  Location: [facility];  Service: Orthopedics;  Laterality: Left;   AMPUTATION Left 07/04/2022   Procedure: LEFT BELOW KNEE AMPUTATION;  Surgeon: Newt Minion, MD;  Location: [facility];  Service: Orthopedics;  Laterality: Left;   ANTERIOR FUSION CERVICAL SPINE  02/06/06   C4-5, C5-6, C6-7; SURGEON DR. MAX COHEN   AV FISTULA PLACEMENT Left 06/02/2020   Procedure: ARTERIOVENOUS (AV) FISTULA CREATION LEFT;  Surgeon: Waynetta Sandy, MD;  Location: [facility];  Service: Vascular;  Laterality: Left;   BACK SURGERY     x 3   [place] Left 07/21/2020   Procedure: LEFT UPPER ARM ATERIOVENOUS SUPERFISTULALIZATION;  Surgeon: Waynetta Sandy, MD;  Location: [facility];  Service: Vascular;  Laterality: Left;   BELOW KNEE LEG AMPUTATION Right    CARDIAC CATHETERIZATION  10/31/04   2009   CAROTID ENDARTERECTOMY  11/10/10   CAROTID ENDARTERECTOMY Left 11/10/2010   Subtotal occlusion of left internal carotid artery with left hemispheric transient ischemic attacks.   CAROTID STENT     CARPAL TUNNEL RELEASE Right 10/21/2013   Procedure: RIGHT CARPAL TUNNEL RELEASE;  Surgeon: Wynonia Sours, MD;  Location: [facility];  Service: Orthopedics;  Laterality: Right;   CHOLECYSTECTOMY     COLON SURGERY     COLONOSCOPY     COLOSTOMY REVERSAL  05/21/2018   ileostomy reversal   CYSTOSCOPY WITH STENT PLACEMENT Bilateral 01/13/2018   Procedure: CYSTOSCOPY WITH BILATERAL URETERAL CATHETER PLACEMENT;  Surgeon:  Ardis Hughs, MD;  [facility];  Service: Urology;  Laterality: Bilateral;   ESOPHAGEAL MANOMETRY Bilateral 07/19/2014   Procedure: ESOPHAGEAL MANOMETRY (EM);  Surgeon: Jerene Bears, MD;  [facility];  Service: Gastroenterology;  Laterality: Bilateral;   EYE SURGERY Bilateral 2020   cataract   FEMORAL ARTERY STENT     x6   FINGER SURGERY     FOOT SURGERY  04/25/2016    EXCISION BASE 5TH METATARSAL AND PARTIAL CUBOID LEFT FOOT   HERNIA REPAIR     LEFT INGUINAL AND UMBILICAL REPAIRS   HERNIA REPAIR     I & D EXTREMITY Left 04/25/2016   Procedure: EXCISION BASE 5TH METATARSAL AND PARTIAL CUBOID LEFT FOOT;  Surgeon: Newt Minion, MD;  Location: [facility];  Service: Orthopedics;  Laterality: Left;   ILEOSTOMY  01/13/2018   Procedure: ILEOSTOMY;  Surgeon: Clovis Riley, MD;  Location[facility];  Service: General;;   ILEOSTOMY CLOSURE N/A 05/21/2018   Procedure: ILEOSTOMY REVERSAL ERAS PATHWAY;  Surgeon: Clovis Riley, MD;  Location: [facility];  Service: General;  Laterality: N/A;   IR RADIOLOGIST EVAL & MGMT  11/19/2017   IR RADIOLOGIST EVAL & MGMT  12/03/2017   IR RADIOLOGIST EVAL & MGMT  12/18/2017   JOINT REPLACEMENT Right 2001   Total knee   LAMINECTOMY     X 3 LUMBAR AND X 2 CERVICAL SPINE OPERATIONS   LAPAROSCOPIC CHOLECYSTECTOMY W/ CHOLANGIOGRAPHY  11/09/04   SURGEON DR. Luella Cook  LEFT HEART CATH AND CORONARY ANGIOGRAPHY N/A 09/16/2018   Procedure: LEFT HEART CATH AND CORONARY ANGIOGRAPHY;  Surgeon: Nigel Mormon, MD;  Location: [facility];  Service: Cardiovascular;  Laterality: N/A;   LEFT HEART CATHETERIZATION WITH CORONARY ANGIOGRAM N/A 10/29/2014   Procedure: LEFT HEART CATHETERIZATION WITH CORONARY ANGIOGRAM;  Surgeon: Laverda Page, MD;  Location[facility];  Service: Cardiovascular;  Laterality: N/A;   LIGATION OF COMPETING BRANCHES OF ARTERIOVENOUS FISTULA Left 07/21/2020   Procedure: LIGATION OF COMPETING BRANCHES OF LEFT UPPER ARM  ARTERIOVENOUS FISTULA;  Surgeon: Waynetta Sandy, MD;  Location: [facility];  Service: Vascular;  Laterality: Left;   LOWER EXTREMITY ANGIOGRAM N/A 03/19/2012   Procedure: LOWER EXTREMITY ANGIOGRAM;  Surgeon: Burnell Blanks, MD;  Location[facility];  Service: Cardiovascular;  Laterality: N/A;   LOWER EXTREMITY ANGIOGRAPHY N/A 06/20/2021   Procedure: LOWER EXTREMITY ANGIOGRAPHY;  Surgeon: Nigel Mormon, MD;  Location: [facility];  Service: Cardiovascular;  Laterality: N/A;   NECK SURGERY     PARTIAL COLECTOMY N/A 01/13/2018   Procedure: LAPAROSCOPIC ASSISTED   SIGMOID COLECTOMY ILEOSTOMY;  Surgeon: Clovis Riley, MD;  [facility];  Service: General;  Laterality: N/A;   PENILE PROSTHESIS IMPLANT  08/14/05   INFRAPUBIC INSERTION OF INFLATABLE PENILE PROSTHESIS; SURGEON DR. Amalia Hailey   PENILE PROSTHESIS IMPLANT     PERCUTANEOUS CORONARY STENT INTERVENTION (PCI-S) Right 10/29/2014   Procedure: PERCUTANEOUS CORONARY STENT INTERVENTION (PCI-S);  Surgeon: Laverda Page, MD;  Location: [facility];  Service: Cardiovascular;  Laterality: Right;   PERIPHERAL VASCULAR INTERVENTION Left 06/23/2021   Procedure: PERIPHERAL VASCULAR INTERVENTION;  Surgeon: Nigel Mormon, MD;  Location: [facility];  Service: Cardiovascular;  Laterality: Left;   REMOVAL OF PENILE PROSTHESIS N/A 06/14/2021   Procedure: Removal of THREE piece inflatable penile prosthesis;  Surgeon: Lucas Mallow, MD;  Location: [facility];  Service: Urology;  Laterality: N/A;   SHOULDER ARTHROSCOPY     SPINE SURGERY     TOE AMPUTATION Left    TONSILLECTOMY     TOTAL KNEE ARTHROPLASTY  07/2002   RIGHT KNEE ; SURGEON  DR. [name] ALSO HAD ARTHROSCOPIC RIGHT KNEE IN  10/2001   TOTAL KNEE ARTHROPLASTY     ULNAR NERVE TRANSPOSITION Right 10/21/2013   Procedure: RIGHT ELBOW  ULNAR NERVE DECOMPRESSION;  Surgeon: Wynonia Sours, MD;  Location: [facility];  Service: Orthopedics;  Laterality:  Right;   Social History   Occupational History   Occupation: Magazine features editor: UNEMPLOYED  Tobacco Use   Smoking status: Former    Packs/day: 2.00    Years: 35.00    Total pack years: 70.00    Types: Cigarettes    Quit date: 10/28/2011    Years since quitting: 11.0   Smokeless tobacco: Never  Vaping Use   Vaping Use: Some days   Substances: Nicotine  Substance and Sexual Activity   Alcohol use: Not Currently    Comment: "not in a long time"   Drug use: Never   Sexual activity: Yes    Birth control/protection: Implant    Comment: penile implant

## 2022-11-07 DIAGNOSIS — M6281 Muscle weakness (generalized): Secondary | ICD-10-CM | POA: Diagnosis not present

## 2022-11-07 DIAGNOSIS — Z89511 Acquired absence of right leg below knee: Secondary | ICD-10-CM | POA: Diagnosis not present

## 2022-11-07 DIAGNOSIS — Z89512 Acquired absence of left leg below knee: Secondary | ICD-10-CM | POA: Diagnosis not present

## 2022-11-07 DIAGNOSIS — Z741 Need for assistance with personal care: Secondary | ICD-10-CM | POA: Diagnosis not present

## 2022-11-07 DIAGNOSIS — Z4781 Encounter for orthopedic aftercare following surgical amputation: Secondary | ICD-10-CM | POA: Diagnosis not present

## 2022-11-07 DIAGNOSIS — R2681 Unsteadiness on feet: Secondary | ICD-10-CM | POA: Diagnosis not present

## 2022-11-08 DIAGNOSIS — D509 Iron deficiency anemia, unspecified: Secondary | ICD-10-CM | POA: Diagnosis not present

## 2022-11-08 DIAGNOSIS — R2681 Unsteadiness on feet: Secondary | ICD-10-CM | POA: Diagnosis not present

## 2022-11-08 DIAGNOSIS — M6281 Muscle weakness (generalized): Secondary | ICD-10-CM | POA: Diagnosis not present

## 2022-11-08 DIAGNOSIS — E875 Hyperkalemia: Secondary | ICD-10-CM | POA: Diagnosis not present

## 2022-11-08 DIAGNOSIS — Z89511 Acquired absence of right leg below knee: Secondary | ICD-10-CM | POA: Diagnosis not present

## 2022-11-08 DIAGNOSIS — Z4781 Encounter for orthopedic aftercare following surgical amputation: Secondary | ICD-10-CM | POA: Diagnosis not present

## 2022-11-08 DIAGNOSIS — Z992 Dependence on renal dialysis: Secondary | ICD-10-CM | POA: Diagnosis not present

## 2022-11-08 DIAGNOSIS — N186 End stage renal disease: Secondary | ICD-10-CM | POA: Diagnosis not present

## 2022-11-08 DIAGNOSIS — Z89512 Acquired absence of left leg below knee: Secondary | ICD-10-CM | POA: Diagnosis not present

## 2022-11-08 DIAGNOSIS — D631 Anemia in chronic kidney disease: Secondary | ICD-10-CM | POA: Diagnosis not present

## 2022-11-08 DIAGNOSIS — N2581 Secondary hyperparathyroidism of renal origin: Secondary | ICD-10-CM | POA: Diagnosis not present

## 2022-11-08 DIAGNOSIS — Z741 Need for assistance with personal care: Secondary | ICD-10-CM | POA: Diagnosis not present

## 2022-11-08 DIAGNOSIS — E1122 Type 2 diabetes mellitus with diabetic chronic kidney disease: Secondary | ICD-10-CM | POA: Diagnosis not present

## 2022-11-08 NOTE — Telephone Encounter (Signed)
Error

## 2022-11-09 DIAGNOSIS — Z4781 Encounter for orthopedic aftercare following surgical amputation: Secondary | ICD-10-CM | POA: Diagnosis not present

## 2022-11-09 DIAGNOSIS — M6281 Muscle weakness (generalized): Secondary | ICD-10-CM | POA: Diagnosis not present

## 2022-11-09 DIAGNOSIS — Z89512 Acquired absence of left leg below knee: Secondary | ICD-10-CM | POA: Diagnosis not present

## 2022-11-10 DIAGNOSIS — E875 Hyperkalemia: Secondary | ICD-10-CM | POA: Diagnosis not present

## 2022-11-10 DIAGNOSIS — Z89512 Acquired absence of left leg below knee: Secondary | ICD-10-CM | POA: Diagnosis not present

## 2022-11-10 DIAGNOSIS — Z992 Dependence on renal dialysis: Secondary | ICD-10-CM | POA: Diagnosis not present

## 2022-11-10 DIAGNOSIS — Z4781 Encounter for orthopedic aftercare following surgical amputation: Secondary | ICD-10-CM | POA: Diagnosis not present

## 2022-11-10 DIAGNOSIS — N186 End stage renal disease: Secondary | ICD-10-CM | POA: Diagnosis not present

## 2022-11-10 DIAGNOSIS — N2581 Secondary hyperparathyroidism of renal origin: Secondary | ICD-10-CM | POA: Diagnosis not present

## 2022-11-10 DIAGNOSIS — M6281 Muscle weakness (generalized): Secondary | ICD-10-CM | POA: Diagnosis not present

## 2022-11-12 DIAGNOSIS — Z4781 Encounter for orthopedic aftercare following surgical amputation: Secondary | ICD-10-CM | POA: Diagnosis not present

## 2022-11-12 DIAGNOSIS — Z89512 Acquired absence of left leg below knee: Secondary | ICD-10-CM | POA: Diagnosis not present

## 2022-11-12 DIAGNOSIS — M6281 Muscle weakness (generalized): Secondary | ICD-10-CM | POA: Diagnosis not present

## 2022-11-13 DIAGNOSIS — N2581 Secondary hyperparathyroidism of renal origin: Secondary | ICD-10-CM | POA: Diagnosis not present

## 2022-11-13 DIAGNOSIS — N186 End stage renal disease: Secondary | ICD-10-CM | POA: Diagnosis not present

## 2022-11-13 DIAGNOSIS — E875 Hyperkalemia: Secondary | ICD-10-CM | POA: Diagnosis not present

## 2022-11-13 DIAGNOSIS — Z89512 Acquired absence of left leg below knee: Secondary | ICD-10-CM | POA: Diagnosis not present

## 2022-11-13 DIAGNOSIS — Z4781 Encounter for orthopedic aftercare following surgical amputation: Secondary | ICD-10-CM | POA: Diagnosis not present

## 2022-11-13 DIAGNOSIS — Z992 Dependence on renal dialysis: Secondary | ICD-10-CM | POA: Diagnosis not present

## 2022-11-13 DIAGNOSIS — M6281 Muscle weakness (generalized): Secondary | ICD-10-CM | POA: Diagnosis not present

## 2022-11-14 ENCOUNTER — Encounter: Payer: Self-pay | Admitting: Family

## 2022-11-14 ENCOUNTER — Ambulatory Visit: Payer: 59 | Admitting: Family

## 2022-11-14 ENCOUNTER — Ambulatory Visit (INDEPENDENT_AMBULATORY_CARE_PROVIDER_SITE_OTHER): Payer: Medicare HMO | Admitting: Family

## 2022-11-14 DIAGNOSIS — Z89512 Acquired absence of left leg below knee: Secondary | ICD-10-CM

## 2022-11-14 DIAGNOSIS — Z89511 Acquired absence of right leg below knee: Secondary | ICD-10-CM

## 2022-11-14 DIAGNOSIS — Z4781 Encounter for orthopedic aftercare following surgical amputation: Secondary | ICD-10-CM | POA: Diagnosis not present

## 2022-11-14 DIAGNOSIS — M6281 Muscle weakness (generalized): Secondary | ICD-10-CM | POA: Diagnosis not present

## 2022-11-14 NOTE — Progress Notes (Signed)
Office Visit Note   Patient: [name]           Date of Birth: 02-24-1956           MRN: [id number] Visit Date: 11/14/2022              Requested by: [name], PA-C [id number] [place],  [place] [address] PCP: [name], PA-C  Chief Complaint  Patient presents with   Left Leg - Follow-up    07/05/2023 left BKA       HPI: The patient is a 67 year old gentleman who is seen in follow-up for concern of ulcers to his left thigh.  He is status post recent left below-knee amputation when he began using his prosthesis he was concerned about some ulcers to the medial thigh.  At that time was seen by [name] instructed to stop using his prosthetic.  He has been wearing a shrinker since.  He continues to reside in [place].  Today he is in a power chair for mobility  Assessment & Plan: Visit Diagnoses: No diagnosis found.  Plan: Instructed on proper wear of the proximal basis.  He may resume prosthesis begin weightbearing.  Discussed using the shrinker having this folded over and do not allow him to roll down.  Follow-Up Instructions: Return in about 4 weeks (around 12/12/2022), or if symptoms worsen or fail to improve.   Ortho Exam  Patient is alert, oriented, no adenopathy, well-dressed, normal affect, normal respiratory effort. On examination of the left residual limb this is well consolidated well-healed to the medial thigh he has 3 areas of injury which are covered with eschar there is no drainage erythema or sign of infection.  These appear to be pressure injuries from his shrinker unfortunately there is a new area of pressure injury in the popliteal fossa where his shrinker today has rolled down.  The skin is intact.  Imaging: No results found. No images are attached to the encounter.  Labs: Lab Results  Component Value Date   HGBA1C 7.6 (H) 01/11/2022   HGBA1C 7.9 (H) 06/14/2021   HGBA1C 5.7 (H) 09/16/2018   ESRSEDRATE 26 (H) 06/23/2022   ESRSEDRATE 40  (H) 01/13/2022   ESRSEDRATE 55 (H) 01/11/2022   CRP 14.0 (H) 06/23/2022   CRP 3.6 (H) 01/13/2022   CRP 4.2 (H) 01/11/2022   REPTSTATUS 06/30/2022 FINAL 06/25/2022   GRAMSTAIN  06/14/2021    NO ORGANISMS SEEN SQUAMOUS EPITHELIAL CELLS PRESENT ABUNDANT WBC PRESENT,BOTH PMN AND MONONUCLEAR MODERATE GRAM POSITIVE COCCI    CULT  06/25/2022    NO GROWTH 5 DAYS Performed at [facility], [place] [address] [place], [place] [address]    LABORGA GROUP B STREP(S.AGALACTIAE)ISOLATED 06/23/2022     Lab Results  Component Value Date   ALBUMIN 2.4 (L) 07/14/2022   ALBUMIN 2.3 (L) 07/09/2022   ALBUMIN 2.7 (L) 07/04/2022   PREALBUMIN 18 06/23/2022   PREALBUMIN 19.0 05/04/2016   PREALBUMIN 25.0 12/16/2015    Lab Results  Component Value Date   MG 1.5 (L) 06/29/2022   MG 1.5 (L) 06/28/2022   MG 1.8 01/14/2022   No results found for: "VD25OH"  Lab Results  Component Value Date   PREALBUMIN 18 06/23/2022   PREALBUMIN 19.0 05/04/2016   PREALBUMIN 25.0 12/16/2015      Latest Ref Rng & Units 07/14/2022    8:03 AM 07/06/2022    5:01 AM 07/05/2022    3:52 AM  CBC EXTENDED  WBC  4.0 - 10.5 K/uL 7.0  8.3  8.4   RBC 4.22 - 5.81 MIL/uL 2.96  2.82  2.69   Hemoglobin 13.0 - 17.0 g/dL 8.9  8.8  8.4   HCT 39.0 - 52.0 % 26.6  25.7  24.6   Platelets 150 - 400 K/uL 276  205  229      There is no height or weight on file to calculate BMI.  Orders:  No orders of the defined types were placed in this encounter.  No orders of the defined types were placed in this encounter.    Procedures: No procedures performed  Clinical Data: No additional findings.  ROS:  All other systems negative, except as noted in the HPI. Review of Systems  Objective: Vital Signs: There were no vitals taken for this visit.  Specialty Comments:  No specialty comments available.  PMFS History: Patient Active Problem List   Diagnosis Date Noted   Chest wall pain 07/04/2022   S/P BKA (below knee  amputation) unilateral, left ([place]) 07/04/2022   Subacute osteomyelitis, left ankle and foot ([place]) 07/03/2022   Atrial fibrillation, chronic ([place]) 01/13/2022   Physical deconditioning 01/13/2022   Anemia of renal disease 01/13/2022   Obesity (BMI 30-39.9) 01/13/2022   Decubitus ulcer of buttock 01/13/2022   Intractable nausea and vomiting with epigastric pain 01/11/2022   Chronic osteomyelitis ([place]) 01/11/2022   Proliferative diabetic retinopathy of left eye with macular edema associated with type 2 diabetes mellitus ([place]) 11/27/2021   Vitreous hemorrhage of right eye ([place]) 0000000   Acute metabolic encephalopathy [id number]   COVID 09/02/2021   Sepsis ([place]) 09/02/2021   Severe sepsis with lactic acidosis ([place]) 06/14/2021   Infection associated with implanted penile prosthesis ([place]) 06/14/2021   ESRD on hemodialysis ([place]) 06/14/2021   Paroxysmal atrial fibrillation with RVR ([place]) 06/14/2021   Carotid stenosis 06/15/2020   Dyslipidemia 06/15/2020   Diabetic macular edema of right eye with proliferative retinopathy associated with type 2 diabetes mellitus ([place]) 01/18/2020   Severe nonproliferative diabetic retinopathy of left eye, with macular edema, associated with type 2 diabetes mellitus ([place]) 01/04/2020   Retinal hemorrhage of right eye 01/04/2020   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   Pseudoclaudication 11/15/2018   Essential hypertension 09/18/2018   S/P BKA (below knee amputation), right ([place]) 09/18/2018   Amputation of toe of left foot ([place]) 09/18/2018   Peripheral artery disease ([place]) 09/18/2018   S/P carotid endarterectomy 09/18/2018   OSA not tolerating CPAP 09/18/2018   Chronic back pain 09/18/2018   Status post reversal of ileostomy 05/21/2018   Normocytic anemia 02/14/2018   Intra-abdominal abscess ([place]) 11/04/2017   Chronic venous hypertension (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 ([place]) 12/11/2016   PAD (peripheral artery disease) ([place])  11/12/2016   Unilateral primary osteoarthritis, left knee 10/11/2016   History of right below knee amputation ([place]) 07/12/2016   Infected diabetic left foot ulcer with chronic osteomyelitis    Diabetic foot infection ([place]) 05/04/2016   Insulin dependent type 2 diabetes mellitus ([place]) 12/16/2015   Amputated toe ([place]) 10/21/2015   Cellulitis of left lower extremity 09/03/2015   Leg edema, left 09/03/2015   CAD (dz of distal, mid and proximal RCA with implantation of 3 overlapping drug-eluting stent,) 09/03/2015   Chronic diastolic heart failure, NYHA class 2 ([place]) 09/03/2015   Angina pectoris associated with type 2 diabetes mellitus ([place]) 10/28/2014   Hyperlipidemia 08/25/2014   Limb pain 03/20/2013   DJD (degenerative joint disease) 09/25/2012   Migraine  09/25/2012   Neuropathy 09/25/2012   Restless legs syndrome (RLS) 09/25/2012   Chronic obstructive pulmonary disease, unspecified ([place]) 04/25/2012   Unknown cause of morbidity or mortality 04/25/2012   Chronic total occlusion of artery of the extremities ([place]) 04/08/2012   Onychomycosis 02/01/2012   Occlusion and stenosis of carotid artery without mention of cerebral infarction 06/12/2011   GERD (gastroesophageal reflux disease) 05/08/2011   Barrett's esophagus without dysplasia 05/08/2011   Former tobacco use 02/01/2011   Past Medical History:  Diagnosis Date   Carotid artery occlusion 11/10/10   LEFT CAROTID ENDARTERECTOMY   Chronic kidney disease    Complication of anesthesia    BP WENT UP AT [facility] "   COPD (chronic obstructive pulmonary disease) ([place])    pt denies this dx as of 06/01/20 - no inhaler    Diabetes mellitus without complication ([place])    Diverticulitis    Diverticulosis of colon (without mention of hemorrhage)    DJD (degenerative joint disease)    knees/hands/feet/back/neck   Fatty liver    Full dentures    GERD (gastroesophageal reflux disease)    H/O hiatal hernia    History of blood transfusion    with a  past surical procedure per patient 06/01/20   Hyperlipidemia    Hypertension    Neuromuscular disorder ([place])    peripheral neuropathy   Non-pressure chronic ulcer of other part of left foot limited to breakdown of skin ([place]) 11/12/2016   Osteomyelitis (HCC)    left 5th metatarsal   PAD (peripheral artery disease) ([place])    Distal aortogram June 2012. Atherectomy left popliteal artery July 2012.    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 Before age 51   Hyperlipidemia Brother    Hypertension Brother    Hypertension Son    Arthritis Other        GRANDMOTHER   Hypertension Other        OTHER FAMILY MEMBERS    Past Surgical History:  Procedure Laterality Date   ABDOMINAL AORTOGRAM W/LOWER EXTREMITY N/A 06/23/2021   Procedure: ABDOMINAL AORTOGRAM W/LOWER EXTREMITY;  Surgeon: Nigel Mormon, MD;  Location: [facility];  Service: Cardiovascular;  Laterality: N/A;   AMPUTATION  11/05/2011   Procedure: AMPUTATION RAY;  Surgeon: Wylene Simmer, MD;  Location: [facility];  Service: Orthopedics;  Laterality: Right;  Amputation of Right 4&5th Toes   AMPUTATION Left 11/26/2012   Procedure: AMPUTATION RAY;  Surgeon: Wylene Simmer, MD;  Location: [facility];  Service: Orthopedics;  Laterality: Left;  fourth ray amputation   AMPUTATION Right 08/27/2014    Procedure: Transmetatarsal Amputation;  Surgeon: Newt Minion, MD;  Location: [facility];  Service: Orthopedics;  Laterality: Right;   AMPUTATION Right 01/14/2015   Procedure: AMPUTATION BELOW KNEE;  Surgeon: Newt Minion, MD;  Location: [facility];  Service: Orthopedics;  Laterality: Right;   AMPUTATION Left 10/21/2015   Procedure: Left Foot 5th Ray Amputation;  Surgeon: Newt Minion, MD;  Location: [facility];  Service: Orthopedics;  Laterality: Left;   AMPUTATION Left 07/04/2022   Procedure: LEFT BELOW KNEE AMPUTATION;  Surgeon: Newt Minion, MD;  Location: [facility];  Service: Orthopedics;  Laterality: Left;   ANTERIOR FUSION CERVICAL SPINE  02/06/06   C4-5, C5-6, C6-7; SURGEON DR. MAX COHEN   AV FISTULA PLACEMENT Left 06/02/2020   Procedure: ARTERIOVENOUS (AV) FISTULA CREATION LEFT;  Surgeon: Waynetta Sandy, MD;  Location: [facility];  Service: Vascular;  Laterality: Left;   BACK SURGERY     x 3   [place] Left 07/21/2020   Procedure: LEFT UPPER ARM ATERIOVENOUS SUPERFISTULALIZATION;  Surgeon: Waynetta Sandy, MD;  Location: [facility];  Service: Vascular;  Laterality: Left;   BELOW KNEE LEG AMPUTATION Right    CARDIAC CATHETERIZATION  10/31/04   2009   CAROTID ENDARTERECTOMY  11/10/10   CAROTID ENDARTERECTOMY Left 11/10/2010   Subtotal occlusion of left internal carotid artery with left hemispheric transient ischemic attacks.   CAROTID STENT     CARPAL TUNNEL RELEASE Right 10/21/2013   Procedure: RIGHT CARPAL TUNNEL RELEASE;  Surgeon: Wynonia Sours, MD;  Location: [facility];  Service: Orthopedics;  Laterality: Right;   CHOLECYSTECTOMY     COLON SURGERY     COLONOSCOPY     COLOSTOMY REVERSAL  05/21/2018   ileostomy reversal   CYSTOSCOPY WITH STENT PLACEMENT Bilateral 01/13/2018   Procedure: CYSTOSCOPY WITH BILATERAL URETERAL CATHETER PLACEMENT;  Surgeon: Ardis Hughs, MD;  [facility];  Service: Urology;  Laterality: Bilateral;   ESOPHAGEAL MANOMETRY  Bilateral 07/19/2014   Procedure: ESOPHAGEAL MANOMETRY (EM);  Surgeon: Jerene Bears, MD;  [facility];  Service: Gastroenterology;  Laterality: Bilateral;   EYE SURGERY Bilateral 2020   cataract   FEMORAL ARTERY STENT     x6   FINGER SURGERY     FOOT SURGERY  04/25/2016    EXCISION BASE 5TH METATARSAL AND PARTIAL CUBOID LEFT FOOT   HERNIA REPAIR     LEFT INGUINAL AND UMBILICAL REPAIRS   HERNIA REPAIR     I & D EXTREMITY Left 04/25/2016   Procedure: EXCISION BASE 5TH METATARSAL AND PARTIAL CUBOID LEFT FOOT;  Surgeon: Newt Minion, MD;  Location: [facility];  Service: Orthopedics;  Laterality: Left;   ILEOSTOMY  01/13/2018   Procedure: ILEOSTOMY;  Surgeon: Clovis Riley, MD;  [facility];  Service: General;;   ILEOSTOMY CLOSURE N/A 05/21/2018   Procedure: ILEOSTOMY REVERSAL ERAS PATHWAY;  Surgeon: Clovis Riley, MD;  Location: [facility];  Service: General;  Laterality: N/A;   IR RADIOLOGIST EVAL & MGMT  11/19/2017   IR RADIOLOGIST EVAL & MGMT  12/03/2017   IR RADIOLOGIST EVAL & MGMT  12/18/2017   JOINT REPLACEMENT Right 2001   Total knee   LAMINECTOMY     X 3 LUMBAR AND X 2 CERVICAL SPINE OPERATIONS   LAPAROSCOPIC CHOLECYSTECTOMY W/ CHOLANGIOGRAPHY  11/09/04   SURGEON DR. Luella Cook  LEFT HEART CATH AND CORONARY ANGIOGRAPHY N/A 09/16/2018   Procedure: LEFT HEART CATH AND CORONARY ANGIOGRAPHY;  Surgeon: Nigel Mormon, MD;  Location: [facility];  Service: Cardiovascular;  Laterality: N/A;   LEFT HEART CATHETERIZATION WITH CORONARY ANGIOGRAM N/A 10/29/2014   Procedure: LEFT HEART CATHETERIZATION WITH CORONARY ANGIOGRAM;  Surgeon: Laverda Page, MD;  Location: [facility];  Service: Cardiovascular;  Laterality: N/A;   LIGATION OF COMPETING BRANCHES OF ARTERIOVENOUS FISTULA Left 07/21/2020   Procedure: LIGATION OF COMPETING BRANCHES OF LEFT UPPER ARM ARTERIOVENOUS FISTULA;  Surgeon: Waynetta Sandy, MD;  Location: [facility];  Service: Vascular;  Laterality:  Left;   LOWER EXTREMITY ANGIOGRAM N/A 03/19/2012   Procedure: LOWER EXTREMITY ANGIOGRAM;  Surgeon: Burnell Blanks, MD;  Location: [facility] LAB;  Service: Cardiovascular;  Laterality: N/A;   LOWER EXTREMITY ANGIOGRAPHY N/A 06/20/2021   Procedure: LOWER EXTREMITY ANGIOGRAPHY;  Surgeon: Nigel Mormon, MD;  Location: [facility];  Service: Cardiovascular;  Laterality: N/A;   NECK SURGERY     PARTIAL COLECTOMY N/A 01/13/2018   Procedure: LAPAROSCOPIC ASSISTED   SIGMOID COLECTOMY ILEOSTOMY;  Surgeon: Clovis Riley, MD;  [facility];  Service: General;  Laterality: N/A;   PENILE PROSTHESIS IMPLANT  08/14/05   INFRAPUBIC INSERTION OF INFLATABLE PENILE PROSTHESIS; SURGEON DR. Amalia Hailey   PENILE PROSTHESIS IMPLANT     PERCUTANEOUS CORONARY STENT INTERVENTION (PCI-S) Right 10/29/2014   Procedure: PERCUTANEOUS CORONARY STENT INTERVENTION (PCI-S);  Surgeon: Laverda Page, MD;  Location: [facility];  Service: Cardiovascular;  Laterality: Right;   PERIPHERAL VASCULAR INTERVENTION Left 06/23/2021   Procedure: PERIPHERAL VASCULAR INTERVENTION;  Surgeon: Nigel Mormon, MD;  Location: [facility];  Service: Cardiovascular;  Laterality: Left;   REMOVAL OF PENILE PROSTHESIS N/A 06/14/2021   Procedure: Removal of THREE piece inflatable penile prosthesis;  Surgeon: Lucas Mallow, MD;  Location: [facility];  Service: Urology;  Laterality: N/A;   SHOULDER ARTHROSCOPY     SPINE SURGERY     TOE AMPUTATION Left    TONSILLECTOMY     TOTAL KNEE ARTHROPLASTY  07/2002   RIGHT KNEE ; SURGEON  DR. [name] ALSO HAD ARTHROSCOPIC RIGHT KNEE IN  10/2001   TOTAL KNEE ARTHROPLASTY     ULNAR NERVE TRANSPOSITION Right 10/21/2013   Procedure: RIGHT ELBOW  ULNAR NERVE DECOMPRESSION;  Surgeon: Wynonia Sours, MD;  Location: [facility];  Service: Orthopedics;  Laterality: Right;   Social History   Occupational History   Occupation: Magazine features editor: UNEMPLOYED  Tobacco Use    Smoking status: Former    Packs/day: 2.00    Years: 35.00    Total pack years: 70.00    Types: Cigarettes    Quit date: 10/28/2011    Years since quitting: 11.0   Smokeless tobacco: Never  Vaping Use   Vaping Use: Some days   Substances: Nicotine  Substance and Sexual Activity   Alcohol use: Not Currently    Comment: "not in a long time"   Drug use: Never   Sexual activity: Yes    Birth control/protection: Implant    Comment: penile implant

## 2022-11-15 DIAGNOSIS — N319 Neuromuscular dysfunction of bladder, unspecified: Secondary | ICD-10-CM | POA: Diagnosis not present

## 2022-11-15 DIAGNOSIS — Z992 Dependence on renal dialysis: Secondary | ICD-10-CM | POA: Diagnosis not present

## 2022-11-15 DIAGNOSIS — Z89512 Acquired absence of left leg below knee: Secondary | ICD-10-CM | POA: Diagnosis not present

## 2022-11-15 DIAGNOSIS — N186 End stage renal disease: Secondary | ICD-10-CM | POA: Diagnosis not present

## 2022-11-15 DIAGNOSIS — Z4781 Encounter for orthopedic aftercare following surgical amputation: Secondary | ICD-10-CM | POA: Diagnosis not present

## 2022-11-15 DIAGNOSIS — I482 Chronic atrial fibrillation, unspecified: Secondary | ICD-10-CM | POA: Diagnosis not present

## 2022-11-15 DIAGNOSIS — N2581 Secondary hyperparathyroidism of renal origin: Secondary | ICD-10-CM | POA: Diagnosis not present

## 2022-11-15 DIAGNOSIS — E875 Hyperkalemia: Secondary | ICD-10-CM | POA: Diagnosis not present

## 2022-11-16 DIAGNOSIS — E1140 Type 2 diabetes mellitus with diabetic neuropathy, unspecified: Secondary | ICD-10-CM | POA: Diagnosis not present

## 2022-11-16 DIAGNOSIS — Z89512 Acquired absence of left leg below knee: Secondary | ICD-10-CM | POA: Diagnosis not present

## 2022-11-16 DIAGNOSIS — Z4781 Encounter for orthopedic aftercare following surgical amputation: Secondary | ICD-10-CM | POA: Diagnosis not present

## 2022-11-16 DIAGNOSIS — S70312D Abrasion, left thigh, subsequent encounter: Secondary | ICD-10-CM | POA: Diagnosis not present

## 2022-11-16 DIAGNOSIS — M6281 Muscle weakness (generalized): Secondary | ICD-10-CM | POA: Diagnosis not present

## 2022-11-16 DIAGNOSIS — T879 Unspecified complications of amputation stump: Secondary | ICD-10-CM | POA: Diagnosis not present

## 2022-11-17 DIAGNOSIS — E875 Hyperkalemia: Secondary | ICD-10-CM | POA: Diagnosis not present

## 2022-11-17 DIAGNOSIS — Z992 Dependence on renal dialysis: Secondary | ICD-10-CM | POA: Diagnosis not present

## 2022-11-17 DIAGNOSIS — M6281 Muscle weakness (generalized): Secondary | ICD-10-CM | POA: Diagnosis not present

## 2022-11-17 DIAGNOSIS — Z89512 Acquired absence of left leg below knee: Secondary | ICD-10-CM | POA: Diagnosis not present

## 2022-11-17 DIAGNOSIS — N2581 Secondary hyperparathyroidism of renal origin: Secondary | ICD-10-CM | POA: Diagnosis not present

## 2022-11-17 DIAGNOSIS — Z4781 Encounter for orthopedic aftercare following surgical amputation: Secondary | ICD-10-CM | POA: Diagnosis not present

## 2022-11-17 DIAGNOSIS — N186 End stage renal disease: Secondary | ICD-10-CM | POA: Diagnosis not present

## 2022-11-19 ENCOUNTER — Emergency Department (HOSPITAL_COMMUNITY): Payer: Medicare HMO

## 2022-11-19 ENCOUNTER — Inpatient Hospital Stay (HOSPITAL_COMMUNITY)
Admission: EM | Admit: 2022-11-19 | Discharge: 2022-11-23 | DRG: 564 | Disposition: A | Discharge status: Skilled Nursing Facility | Payer: Medicare HMO | Source: Skilled Nursing Facility | Attending: Internal Medicine | Admitting: Internal Medicine

## 2022-11-19 DIAGNOSIS — T8744 Infection of amputation stump, left lower extremity: Secondary | ICD-10-CM | POA: Diagnosis not present

## 2022-11-19 DIAGNOSIS — M6281 Muscle weakness (generalized): Secondary | ICD-10-CM | POA: Diagnosis not present

## 2022-11-19 DIAGNOSIS — E113511 Type 2 diabetes mellitus with proliferative diabetic retinopathy with macular edema, right eye: Secondary | ICD-10-CM | POA: Diagnosis not present

## 2022-11-19 DIAGNOSIS — Z955 Presence of coronary angioplasty implant and graft: Secondary | ICD-10-CM

## 2022-11-19 DIAGNOSIS — A419 Sepsis, unspecified organism: Secondary | ICD-10-CM | POA: Diagnosis not present

## 2022-11-19 DIAGNOSIS — Z91041 Radiographic dye allergy status: Secondary | ICD-10-CM

## 2022-11-19 DIAGNOSIS — Z800 Family history of malignant neoplasm of digestive organs: Secondary | ICD-10-CM

## 2022-11-19 DIAGNOSIS — Z4781 Encounter for orthopedic aftercare following surgical amputation: Secondary | ICD-10-CM | POA: Diagnosis not present

## 2022-11-19 DIAGNOSIS — E785 Hyperlipidemia, unspecified: Secondary | ICD-10-CM | POA: Diagnosis present

## 2022-11-19 DIAGNOSIS — Z9049 Acquired absence of other specified parts of digestive tract: Secondary | ICD-10-CM

## 2022-11-19 DIAGNOSIS — Z7901 Long term (current) use of anticoagulants: Secondary | ICD-10-CM

## 2022-11-19 DIAGNOSIS — E1169 Type 2 diabetes mellitus with other specified complication: Secondary | ICD-10-CM | POA: Diagnosis present

## 2022-11-19 DIAGNOSIS — Z743 Need for continuous supervision: Secondary | ICD-10-CM | POA: Diagnosis not present

## 2022-11-19 DIAGNOSIS — I132 Hypertensive heart and chronic kidney disease with heart failure and with stage 5 chronic kidney disease, or end stage renal disease: Secondary | ICD-10-CM | POA: Diagnosis present

## 2022-11-19 DIAGNOSIS — Z83438 Family history of other disorder of lipoprotein metabolism and other lipidemia: Secondary | ICD-10-CM

## 2022-11-19 DIAGNOSIS — E1165 Type 2 diabetes mellitus with hyperglycemia: Secondary | ICD-10-CM | POA: Diagnosis present

## 2022-11-19 DIAGNOSIS — I5032 Chronic diastolic (congestive) heart failure: Secondary | ICD-10-CM | POA: Diagnosis present

## 2022-11-19 DIAGNOSIS — L03116 Cellulitis of left lower limb: Principal | ICD-10-CM | POA: Diagnosis present

## 2022-11-19 DIAGNOSIS — F17210 Nicotine dependence, cigarettes, uncomplicated: Secondary | ICD-10-CM | POA: Diagnosis present

## 2022-11-19 DIAGNOSIS — N2581 Secondary hyperparathyroidism of renal origin: Secondary | ICD-10-CM | POA: Diagnosis present

## 2022-11-19 DIAGNOSIS — R300 Dysuria: Secondary | ICD-10-CM | POA: Diagnosis present

## 2022-11-19 DIAGNOSIS — Z89512 Acquired absence of left leg below knee: Secondary | ICD-10-CM | POA: Diagnosis not present

## 2022-11-19 DIAGNOSIS — L0390 Cellulitis, unspecified: Secondary | ICD-10-CM | POA: Diagnosis present

## 2022-11-19 DIAGNOSIS — I10 Essential (primary) hypertension: Secondary | ICD-10-CM | POA: Diagnosis not present

## 2022-11-19 DIAGNOSIS — Z8673 Personal history of transient ischemic attack (TIA), and cerebral infarction without residual deficits: Secondary | ICD-10-CM

## 2022-11-19 DIAGNOSIS — K760 Fatty (change of) liver, not elsewhere classified: Secondary | ICD-10-CM | POA: Diagnosis present

## 2022-11-19 DIAGNOSIS — E1122 Type 2 diabetes mellitus with diabetic chronic kidney disease: Secondary | ICD-10-CM | POA: Diagnosis present

## 2022-11-19 DIAGNOSIS — M79601 Pain in right arm: Secondary | ICD-10-CM | POA: Diagnosis not present

## 2022-11-19 DIAGNOSIS — N400 Enlarged prostate without lower urinary tract symptoms: Secondary | ICD-10-CM | POA: Diagnosis present

## 2022-11-19 DIAGNOSIS — I482 Chronic atrial fibrillation, unspecified: Secondary | ICD-10-CM | POA: Diagnosis present

## 2022-11-19 DIAGNOSIS — Z8249 Family history of ischemic heart disease and other diseases of the circulatory system: Secondary | ICD-10-CM

## 2022-11-19 DIAGNOSIS — Z91040 Latex allergy status: Secondary | ICD-10-CM

## 2022-11-19 DIAGNOSIS — M79605 Pain in left leg: Secondary | ICD-10-CM | POA: Diagnosis not present

## 2022-11-19 DIAGNOSIS — L02416 Cutaneous abscess of left lower limb: Secondary | ICD-10-CM | POA: Diagnosis present

## 2022-11-19 DIAGNOSIS — R609 Edema, unspecified: Secondary | ICD-10-CM | POA: Diagnosis not present

## 2022-11-19 DIAGNOSIS — L7634 Postprocedural seroma of skin and subcutaneous tissue following other procedure: Secondary | ICD-10-CM | POA: Diagnosis present

## 2022-11-19 DIAGNOSIS — N186 End stage renal disease: Secondary | ICD-10-CM | POA: Diagnosis present

## 2022-11-19 DIAGNOSIS — K219 Gastro-esophageal reflux disease without esophagitis: Secondary | ICD-10-CM | POA: Diagnosis present

## 2022-11-19 DIAGNOSIS — Y835 Amputation of limb(s) as the cause of abnormal reaction of the patient, or of later complication, without mention of misadventure at the time of the procedure: Secondary | ICD-10-CM | POA: Diagnosis present

## 2022-11-19 DIAGNOSIS — E1142 Type 2 diabetes mellitus with diabetic polyneuropathy: Secondary | ICD-10-CM | POA: Diagnosis present

## 2022-11-19 DIAGNOSIS — Z833 Family history of diabetes mellitus: Secondary | ICD-10-CM

## 2022-11-19 DIAGNOSIS — E1151 Type 2 diabetes mellitus with diabetic peripheral angiopathy without gangrene: Secondary | ICD-10-CM | POA: Diagnosis present

## 2022-11-19 DIAGNOSIS — Z9841 Cataract extraction status, right eye: Secondary | ICD-10-CM

## 2022-11-19 DIAGNOSIS — Z992 Dependence on renal dialysis: Secondary | ICD-10-CM

## 2022-11-19 DIAGNOSIS — M869 Osteomyelitis, unspecified: Secondary | ICD-10-CM | POA: Diagnosis present

## 2022-11-19 DIAGNOSIS — Z794 Long term (current) use of insulin: Secondary | ICD-10-CM

## 2022-11-19 DIAGNOSIS — E113512 Type 2 diabetes mellitus with proliferative diabetic retinopathy with macular edema, left eye: Secondary | ICD-10-CM | POA: Diagnosis not present

## 2022-11-19 DIAGNOSIS — Z79899 Other long term (current) drug therapy: Secondary | ICD-10-CM

## 2022-11-19 DIAGNOSIS — I2510 Atherosclerotic heart disease of native coronary artery without angina pectoris: Secondary | ICD-10-CM | POA: Diagnosis present

## 2022-11-19 DIAGNOSIS — Z9842 Cataract extraction status, left eye: Secondary | ICD-10-CM

## 2022-11-19 DIAGNOSIS — D631 Anemia in chronic kidney disease: Secondary | ICD-10-CM | POA: Diagnosis present

## 2022-11-19 DIAGNOSIS — Z89511 Acquired absence of right leg below knee: Secondary | ICD-10-CM

## 2022-11-19 DIAGNOSIS — M354 Diffuse (eosinophilic) fasciitis: Secondary | ICD-10-CM | POA: Diagnosis present

## 2022-11-19 DIAGNOSIS — L7632 Postprocedural hematoma of skin and subcutaneous tissue following other procedure: Secondary | ICD-10-CM | POA: Diagnosis present

## 2022-11-19 DIAGNOSIS — R739 Hyperglycemia, unspecified: Secondary | ICD-10-CM | POA: Diagnosis not present

## 2022-11-19 DIAGNOSIS — J449 Chronic obstructive pulmonary disease, unspecified: Secondary | ICD-10-CM | POA: Diagnosis present

## 2022-11-19 LAB — COMPREHENSIVE METABOLIC PANEL
ALT: 13 U/L (ref 0–44)
AST: 20 U/L (ref 15–41)
Albumin: 2.8 g/dL — ABNORMAL LOW (ref 3.5–5.0)
Alkaline Phosphatase: 117 U/L (ref 38–126)
Anion gap: 17 — ABNORMAL HIGH (ref 5–15)
BUN: 42 mg/dL — ABNORMAL HIGH (ref 8–23)
CO2: 25 mmol/L (ref 22–32)
Calcium: 8.6 mg/dL — ABNORMAL LOW (ref 8.9–10.3)
Chloride: 92 mmol/L — ABNORMAL LOW (ref 98–111)
Creatinine, Ser: 6.9 mg/dL — ABNORMAL HIGH (ref 0.61–1.24)
GFR, Estimated: 8 mL/min — ABNORMAL LOW (ref >60)
Glucose, Bld: 313 mg/dL — ABNORMAL HIGH (ref 70–99)
Potassium: 3.8 mmol/L (ref 3.5–5.1)
Sodium: 134 mmol/L — ABNORMAL LOW (ref 135–145)
Total Bilirubin: 0.6 mg/dL (ref 0.3–1.2)
Total Protein: 6.7 g/dL (ref 6.5–8.1)

## 2022-11-19 LAB — LACTIC ACID, PLASMA: Lactic Acid, Venous: 1.7 mmol/L (ref 0.5–1.9)

## 2022-11-19 LAB — CBC WITH DIFFERENTIAL/PLATELET
Abs Immature Granulocytes: 0.02 10*3/uL (ref 0.00–0.07)
Basophils Absolute: 0 10*3/uL (ref 0.0–0.1)
Basophils Relative: 0 %
Eosinophils Absolute: 0.2 10*3/uL (ref 0.0–0.5)
Eosinophils Relative: 2 %
HCT: 33.6 % — ABNORMAL LOW (ref 39.0–52.0)
Hemoglobin: 11.2 g/dL — ABNORMAL LOW (ref 13.0–17.0)
Immature Granulocytes: 0 %
Lymphocytes Relative: 12 %
Lymphs Abs: 1.2 10*3/uL (ref 0.7–4.0)
MCH: 30.4 pg (ref 26.0–34.0)
MCHC: 33.3 g/dL (ref 30.0–36.0)
MCV: 91.3 fL (ref 80.0–100.0)
Monocytes Absolute: 0.8 10*3/uL (ref 0.1–1.0)
Monocytes Relative: 8 %
Neutro Abs: 8.3 10*3/uL — ABNORMAL HIGH (ref 1.7–7.7)
Neutrophils Relative %: 78 %
Platelets: 202 10*3/uL (ref 150–400)
RBC: 3.68 MIL/uL — ABNORMAL LOW (ref 4.22–5.81)
RDW: 12.9 % (ref 11.5–15.5)
WBC: 10.6 10*3/uL — ABNORMAL HIGH (ref 4.0–10.5)
nRBC: 0 % (ref 0.0–0.2)

## 2022-11-19 MED ORDER — SODIUM CHLORIDE 0.9 % IV SOLN
1.0000 g | Freq: Once | INTRAVENOUS | Status: AC
  Administered 2022-11-19: 1 g via INTRAVENOUS
  Filled 2022-11-19: qty 10

## 2022-11-19 MED ORDER — SODIUM CHLORIDE 0.9 % IV SOLN: 2.0000 g | Freq: Once | INTRAVENOUS | Status: DC

## 2022-11-19 MED ORDER — LACTATED RINGERS IV BOLUS
1000.0000 mL | Freq: Once | INTRAVENOUS | Status: AC
  Administered 2022-11-19: 1000 mL via INTRAVENOUS

## 2022-11-19 MED ORDER — VANCOMYCIN HCL 2000 MG/400ML IV SOLN
2000.0000 mg | Freq: Once | INTRAVENOUS | Status: AC
  Administered 2022-11-19: 2000 mg via INTRAVENOUS
  Filled 2022-11-19: qty 400

## 2022-11-19 MED ORDER — ACETAMINOPHEN 500 MG PO TABS
1000.0000 mg | ORAL_TABLET | Freq: Once | ORAL | Status: AC
  Administered 2022-11-19: 1000 mg via ORAL
  Filled 2022-11-19: qty 2

## 2022-11-19 NOTE — ED Provider Notes (Signed)
I saw and evaluated the patient, reviewed the resident's note and I agree with the findings and plan.   Patient presents with generalized weakness as well as left leg pain.  Patient's left lower extremity has prior amputation and does have some evidence of cellulitis.  He is febrile here.  Code sepsis protocol started and patient will require admission   Lacretia Leigh, MD 11/19/22 2138

## 2022-11-19 NOTE — ED Provider Notes (Signed)
[facility] Provider Note   CSN: [id number] Arrival date & time: 11/19/22  2043     History {Add pertinent medical, surgical, social history, OB history to HPI:1} Chief Complaint  Patient presents with   Leg Pain   Fever    [name] is a 67 y.o. male.   Leg Pain Associated symptoms: fever   Fever      Home Medications Prior to Admission medications   Medication Sig Start Date End Date Taking? Authorizing Provider  amiodarone (PACERONE) 200 MG tablet Take 1 tablet (200 mg total) by mouth daily. 10/29/22   Adrian Prows, MD  amLODipine (NORVASC) 5 MG tablet Take 5 mg by mouth daily as needed for dizziness. Take one tablet for BP > 160 mm Hg 01/30/21   [provider]  apixaban (ELIQUIS) 5 MG TABS tablet Take 1 tablet (5 mg total) by mouth 2 (two) times daily. 01/14/22 10/29/22  Mercy Riding, MD  atorvastatin (LIPITOR) 40 MG tablet Take 40 mg by mouth at bedtime.     [provider]  B Complex-C-Zn-Folic Acid (DIALYVITE/ZINC) TABS Take 1 tablet by mouth Every Tuesday,Thursday,and Saturday with dialysis. 10/04/21   [provider]  famotidine (PEPCID) 20 MG tablet Take 20 mg by mouth every other day.    [provider]  fentaNYL (DURAGESIC) 50 MCG/HR Place 1 patch onto the skin every 3 (three) days. 07/10/22   Newt Minion, MD  folic acid (FOLVITE) 1 MG tablet Take 1 mg by mouth daily. 09/18/21   [provider]  Glucosamine HCl (GLUCOSAMINE PO) Take 1 tablet by mouth 2 (two) times daily.    [provider]  insulin glargine (LANTUS) 100 UNIT/ML injection Inject 0.2 mLs (20 Units total) into the skin at bedtime. Patient taking differently: Inject 30 Units into the skin at bedtime. 06/28/22   [name], MD  insulin lispro (HUMALOG) 100 UNIT/ML injection Inject 4-13 Units into the skin 3 (three) times daily before meals. Only with meals if needed    [provider]   ketoconazole (NIZORAL) 2 % cream Apply 1 application topically 2 (two) times daily as needed for irritation.  03/07/18   [provider]  lidocaine-prilocaine (EMLA) cream Apply 1 application topically See admin instructions. Prior to Dialysis days Tuesday,Thursday and saturday 06/06/21   [provider]  loratadine (CLARITIN) 10 MG tablet Take 10 mg by mouth daily.    [provider]  metoCLOPramide (REGLAN) 5 MG tablet Take 1 tablet (5 mg total) by mouth 4 (four) times daily -  before meals and at bedtime. Patient taking differently: Take 5 mg by mouth 2 (two) times daily. 01/14/22 10/29/22  Mercy Riding, MD  midodrine (PROAMATINE) 10 MG tablet Take 1 tablet (10 mg total) by mouth every other day. One hour before dialysis Patient taking differently: Take 10 mg by mouth Every Tuesday,Thursday,and Saturday with dialysis. One hour before dialysis 09/07/21   Barb Merino, MD  NEEDLE, REUSABLE, 22 G 22G X 1-1/2" MISC 1 Units by Does not apply route as directed. For B12 IM inj 02/15/18   [place]gergawy, Silver Huguenin, MD  nitroGLYCERIN (NITROSTAT) 0.4 MG SL tablet Place 0.4 mg under the tongue every 5 (five) minutes as needed for chest pain. 01/06/22   [provider]  NOVOLOG FLEXPEN 100 UNIT/ML FlexPen Inject 4 Units into the skin 3 (three) times daily. 06/28/22   [name], MD  omega-3 acid  ethyl esters (LOVAZA) 1 g capsule Take 2 g by mouth 2 (two) times daily. Mini    [provider]  ondansetron (ZOFRAN-ODT) 4 MG disintegrating tablet Take 1 tablet by mouth 4 (four) times daily as needed for nausea/vomiting. 05/28/22   [provider]  oxycodone-acetaminophen (LYNOX) 5-300 MG tablet Take 1 tablet by mouth every 4 (four) hours as needed for pain.    [provider]  oxyCODONE-acetaminophen (PERCOCET) 7.5-325 MG tablet Take 1 tablet by mouth every 4 (four) hours as needed for severe pain. 07/10/22 07/10/23  Newt Minion, MD  pantoprazole  (PROTONIX) 40 MG tablet Take 1 tablet (40 mg total) by mouth 2 (two) times daily for 30 days, THEN 1 tablet (40 mg total) daily. Patient taking differently:  1 tablet (40 mg total) daily. 01/14/22 10/29/22  Mercy Riding, MD  Polyvinyl Alcohol-Povidone PF 1.4-0.6 % SOLN Place 1 drop into both eyes 2 (two) times daily as needed (dry eyes).     [provider]  pregabalin (LYRICA) 100 MG capsule Take 100 mg by mouth 2 (two) times daily. 09/18/21   [provider]  rOPINIRole (REQUIP) 2 MG tablet Take 2 mg by mouth at bedtime.    [provider]  Syringe/Needle, Disp, (SYRINGE 3CC/22GX1-1/2") 22G X 1-1/2" 3 ML MISC 1 Syringe by Does not apply route as directed. For b12 IM inj 02/15/18   Elgergawy, Silver Huguenin, MD  tamsulosin (FLOMAX) 0.4 MG CAPS capsule Take 0.4 mg by mouth daily.    [provider]      Allergies    Contrast media [iodinated contrast media], Ivp dye [iodinated contrast media], Adhesive [tape], and Latex    Review of Systems   Review of Systems  Constitutional:  Positive for fever.    Physical Exam Updated Vital Signs BP (!) 154/80 (BP Location: Right Arm)   Pulse 95   Temp (!) 101.5 F (38.6 C) (Oral)   Resp 16   SpO2 98%  Physical Exam  ED Results / Procedures / Treatments   Labs (all labs ordered are listed, but only abnormal results are displayed) Labs Reviewed  CULTURE, BLOOD (ROUTINE X 2)  CULTURE, BLOOD (ROUTINE X 2)  COMPREHENSIVE METABOLIC PANEL  LACTIC ACID, PLASMA  LACTIC ACID, PLASMA  CBC WITH DIFFERENTIAL/PLATELET  URINALYSIS, ROUTINE W REFLEX MICROSCOPIC  PROTIME-INR    EKG None  Radiology No results found.  Procedures Procedures  {Document cardiac monitor, telemetry assessment procedure when appropriate:1}  Medications Ordered in ED Medications - No data to display  ED Course/ Medical Decision Making/ A&P   {   Click here for ABCD2, HEART and other calculatorsREFRESH Note before signing :1}                           Medical Decision Making Amount and/or Complexity of Data Reviewed Labs: ordered.   ***  {Document critical care time when appropriate:1} {Document review of labs and clinical decision tools ie heart score, Chads2Vasc2 etc:1}  {Document your independent review of radiology images, and any outside records:1} {Document your discussion with family members, caretakers, and with consultants:1} {Document social determinants of health affecting pt's care:1} {Document your decision making why or why not admission, treatments were needed:1} Final Clinical Impression(s) / ED Diagnoses Final diagnoses:  None    Rx / DC Orders ED Discharge Orders     None

## 2022-11-19 NOTE — ED Triage Notes (Signed)
Pt arrives [facility] from [facility] with complaints of left leg pain, redness and swelling x 1 week. BIL amputee with history of sepsis from leg. Pt AOx3, dialysis left arm.

## 2022-11-20 ENCOUNTER — Encounter (HOSPITAL_COMMUNITY): Payer: Self-pay | Admitting: Internal Medicine

## 2022-11-20 ENCOUNTER — Inpatient Hospital Stay (HOSPITAL_COMMUNITY): Payer: Medicare HMO

## 2022-11-20 ENCOUNTER — Emergency Department (HOSPITAL_COMMUNITY): Payer: Medicare HMO

## 2022-11-20 ENCOUNTER — Other Ambulatory Visit: Payer: Self-pay

## 2022-11-20 DIAGNOSIS — L0390 Cellulitis, unspecified: Secondary | ICD-10-CM

## 2022-11-20 DIAGNOSIS — R52 Pain, unspecified: Secondary | ICD-10-CM | POA: Diagnosis not present

## 2022-11-20 DIAGNOSIS — L03119 Cellulitis of unspecified part of limb: Secondary | ICD-10-CM

## 2022-11-20 DIAGNOSIS — I482 Chronic atrial fibrillation, unspecified: Secondary | ICD-10-CM | POA: Diagnosis not present

## 2022-11-20 DIAGNOSIS — E1169 Type 2 diabetes mellitus with other specified complication: Secondary | ICD-10-CM | POA: Diagnosis not present

## 2022-11-20 DIAGNOSIS — E1165 Type 2 diabetes mellitus with hyperglycemia: Secondary | ICD-10-CM | POA: Diagnosis not present

## 2022-11-20 DIAGNOSIS — K760 Fatty (change of) liver, not elsewhere classified: Secondary | ICD-10-CM | POA: Diagnosis not present

## 2022-11-20 DIAGNOSIS — Z89512 Acquired absence of left leg below knee: Secondary | ICD-10-CM | POA: Diagnosis not present

## 2022-11-20 DIAGNOSIS — Z794 Long term (current) use of insulin: Secondary | ICD-10-CM | POA: Diagnosis not present

## 2022-11-20 DIAGNOSIS — L02419 Cutaneous abscess of limb, unspecified: Secondary | ICD-10-CM

## 2022-11-20 DIAGNOSIS — I5032 Chronic diastolic (congestive) heart failure: Secondary | ICD-10-CM | POA: Diagnosis not present

## 2022-11-20 DIAGNOSIS — J449 Chronic obstructive pulmonary disease, unspecified: Secondary | ICD-10-CM | POA: Diagnosis not present

## 2022-11-20 DIAGNOSIS — Z89511 Acquired absence of right leg below knee: Secondary | ICD-10-CM | POA: Diagnosis not present

## 2022-11-20 DIAGNOSIS — L7632 Postprocedural hematoma of skin and subcutaneous tissue following other procedure: Secondary | ICD-10-CM | POA: Diagnosis not present

## 2022-11-20 DIAGNOSIS — M7989 Other specified soft tissue disorders: Secondary | ICD-10-CM | POA: Diagnosis not present

## 2022-11-20 DIAGNOSIS — L03116 Cellulitis of left lower limb: Secondary | ICD-10-CM

## 2022-11-20 DIAGNOSIS — M869 Osteomyelitis, unspecified: Secondary | ICD-10-CM | POA: Diagnosis not present

## 2022-11-20 DIAGNOSIS — N2581 Secondary hyperparathyroidism of renal origin: Secondary | ICD-10-CM | POA: Diagnosis not present

## 2022-11-20 DIAGNOSIS — D631 Anemia in chronic kidney disease: Secondary | ICD-10-CM | POA: Diagnosis not present

## 2022-11-20 DIAGNOSIS — E785 Hyperlipidemia, unspecified: Secondary | ICD-10-CM | POA: Diagnosis present

## 2022-11-20 DIAGNOSIS — E1122 Type 2 diabetes mellitus with diabetic chronic kidney disease: Secondary | ICD-10-CM | POA: Diagnosis not present

## 2022-11-20 DIAGNOSIS — Z992 Dependence on renal dialysis: Secondary | ICD-10-CM | POA: Diagnosis not present

## 2022-11-20 DIAGNOSIS — Y835 Amputation of limb(s) as the cause of abnormal reaction of the patient, or of later complication, without mention of misadventure at the time of the procedure: Secondary | ICD-10-CM | POA: Diagnosis not present

## 2022-11-20 DIAGNOSIS — E1151 Type 2 diabetes mellitus with diabetic peripheral angiopathy without gangrene: Secondary | ICD-10-CM | POA: Diagnosis not present

## 2022-11-20 DIAGNOSIS — I5030 Unspecified diastolic (congestive) heart failure: Secondary | ICD-10-CM | POA: Diagnosis not present

## 2022-11-20 DIAGNOSIS — T8744 Infection of amputation stump, left lower extremity: Secondary | ICD-10-CM | POA: Diagnosis not present

## 2022-11-20 DIAGNOSIS — L7634 Postprocedural seroma of skin and subcutaneous tissue following other procedure: Secondary | ICD-10-CM | POA: Diagnosis not present

## 2022-11-20 DIAGNOSIS — N186 End stage renal disease: Secondary | ICD-10-CM | POA: Diagnosis not present

## 2022-11-20 DIAGNOSIS — L02416 Cutaneous abscess of left lower limb: Secondary | ICD-10-CM | POA: Diagnosis not present

## 2022-11-20 DIAGNOSIS — E1142 Type 2 diabetes mellitus with diabetic polyneuropathy: Secondary | ICD-10-CM | POA: Diagnosis not present

## 2022-11-20 DIAGNOSIS — I132 Hypertensive heart and chronic kidney disease with heart failure and with stage 5 chronic kidney disease, or end stage renal disease: Secondary | ICD-10-CM | POA: Diagnosis not present

## 2022-11-20 LAB — RENAL FUNCTION PANEL
Albumin: 2.2 g/dL — ABNORMAL LOW (ref 3.5–5.0)
Anion gap: 15 (ref 5–15)
BUN: 49 mg/dL — ABNORMAL HIGH (ref 8–23)
CO2: 23 mmol/L (ref 22–32)
Calcium: 7.9 mg/dL — ABNORMAL LOW (ref 8.9–10.3)
Chloride: 96 mmol/L — ABNORMAL LOW (ref 98–111)
Creatinine, Ser: 7.12 mg/dL — ABNORMAL HIGH (ref 0.61–1.24)
GFR, Estimated: 8 mL/min — ABNORMAL LOW (ref >60)
Glucose, Bld: 241 mg/dL — ABNORMAL HIGH (ref 70–99)
Phosphorus: 4.6 mg/dL (ref 2.5–4.6)
Potassium: 3.8 mmol/L (ref 3.5–5.1)
Sodium: 134 mmol/L — ABNORMAL LOW (ref 135–145)

## 2022-11-20 LAB — BLOOD GAS, VENOUS
Acid-Base Excess: 0.2 mmol/L (ref 0.0–2.0)
Bicarbonate: 26.9 mmol/L (ref 20.0–28.0)
O2 Saturation: 35.8 %
Patient temperature: 37
pCO2, Ven: 51 mmHg (ref 44–60)
pH, Ven: 7.33 (ref 7.25–7.43)
pO2, Ven: <31 mmHg — CL (ref 32–45)

## 2022-11-20 LAB — URINALYSIS, ROUTINE W REFLEX MICROSCOPIC
Bilirubin Urine: NEGATIVE
Glucose, UA: >=500 mg/dL — AB
Ketones, ur: NEGATIVE mg/dL
Nitrite: NEGATIVE
Protein, ur: 100 mg/dL — AB
Specific Gravity, Urine: 1.01 (ref 1.005–1.030)
WBC, UA: >50 WBC/hpf (ref 0–5)
pH: 7 (ref 5.0–8.0)

## 2022-11-20 LAB — CBG MONITORING, ED
Glucose-Capillary: 232 mg/dL — ABNORMAL HIGH (ref 70–99)
Glucose-Capillary: 185 mg/dL — ABNORMAL HIGH (ref 70–99)
Glucose-Capillary: 216 mg/dL — ABNORMAL HIGH (ref 70–99)

## 2022-11-20 LAB — CBC
HCT: 29.5 % — ABNORMAL LOW (ref 39.0–52.0)
Hemoglobin: 9.5 g/dL — ABNORMAL LOW (ref 13.0–17.0)
MCH: 29.7 pg (ref 26.0–34.0)
MCHC: 32.2 g/dL (ref 30.0–36.0)
MCV: 92.2 fL (ref 80.0–100.0)
Platelets: 169 10*3/uL (ref 150–400)
RBC: 3.2 MIL/uL — ABNORMAL LOW (ref 4.22–5.81)
RDW: 13 % (ref 11.5–15.5)
WBC: 10.1 10*3/uL (ref 4.0–10.5)
nRBC: 0 % (ref 0.0–0.2)

## 2022-11-20 LAB — LACTIC ACID, PLASMA: Lactic Acid, Venous: 1.4 mmol/L (ref 0.5–1.9)

## 2022-11-20 LAB — HEPATITIS B SURFACE ANTIGEN: Hepatitis B Surface Ag: NONREACTIVE

## 2022-11-20 LAB — GLUCOSE, CAPILLARY: Glucose-Capillary: 160 mg/dL — ABNORMAL HIGH (ref 70–99)

## 2022-11-20 LAB — MAGNESIUM: Magnesium: 1.8 mg/dL (ref 1.7–2.4)

## 2022-11-20 MED ORDER — INSULIN ASPART 100 UNIT/ML IJ SOLN: 0.0000 [IU] | Freq: Every day | INTRAMUSCULAR | Status: DC

## 2022-11-20 MED ORDER — DOXERCALCIFEROL 4 MCG/2ML IV SOLN
1.0000 ug | INTRAVENOUS | Status: DC
  Administered 2022-11-22: 1 ug via INTRAVENOUS
  Filled 2022-11-20 (×2): qty 2

## 2022-11-20 MED ORDER — ATORVASTATIN CALCIUM 40 MG PO TABS
40.0000 mg | ORAL_TABLET | Freq: Every day | ORAL | Status: DC
  Administered 2022-11-20 – 2022-11-22 (×3): 40 mg via ORAL
  Filled 2022-11-20 (×3): qty 1

## 2022-11-20 MED ORDER — HEPARIN SODIUM (PORCINE) 5000 UNIT/ML IJ SOLN
5000.0000 [IU] | Freq: Three times a day (TID) | INTRAMUSCULAR | Status: DC
  Administered 2022-11-21 (×2): 5000 [IU] via SUBCUTANEOUS
  Filled 2022-11-20 (×3): qty 1

## 2022-11-20 MED ORDER — SODIUM CHLORIDE 0.9 % IV SOLN: 2.0000 g | INTRAVENOUS | Status: DC

## 2022-11-20 MED ORDER — ACETAMINOPHEN 325 MG PO TABS
650.0000 mg | ORAL_TABLET | Freq: Four times a day (QID) | ORAL | Status: DC | PRN
  Administered 2022-11-20 – 2022-11-21 (×2): 650 mg via ORAL
  Filled 2022-11-20 (×2): qty 2

## 2022-11-20 MED ORDER — VANCOMYCIN HCL IN DEXTROSE 1-5 GM/200ML-% IV SOLN
1000.0000 mg | INTRAVENOUS | Status: DC
  Administered 2022-11-20 – 2022-11-22 (×2): 1000 mg via INTRAVENOUS
  Filled 2022-11-20 (×2): qty 200

## 2022-11-20 MED ORDER — INSULIN ASPART 100 UNIT/ML IJ SOLN
0.0000 [IU] | Freq: Three times a day (TID) | INTRAMUSCULAR | Status: DC
  Administered 2022-11-21 – 2022-11-23 (×8): 1 [IU] via SUBCUTANEOUS

## 2022-11-20 MED ORDER — FAMOTIDINE 20 MG PO TABS
20.0000 mg | ORAL_TABLET | ORAL | Status: DC
  Administered 2022-11-20 – 2022-11-22 (×2): 20 mg via ORAL
  Filled 2022-11-20 (×4): qty 1

## 2022-11-20 MED ORDER — POLYETHYLENE GLYCOL 3350 17 G PO PACK: 17.0000 g | PACK | Freq: Every day | ORAL | Status: DC | PRN

## 2022-11-20 MED ORDER — HEPARIN SODIUM (PORCINE) 1000 UNIT/ML DIALYSIS: 1000.0000 [IU] | INTRAMUSCULAR | Status: DC | PRN

## 2022-11-20 MED ORDER — LIDOCAINE HCL (PF) 1 % IJ SOLN: 5.0000 mL | INTRAMUSCULAR | Status: DC | PRN

## 2022-11-20 MED ORDER — LIDOCAINE-PRILOCAINE 2.5-2.5 % EX CREA: 1.0000 | TOPICAL_CREAM | CUTANEOUS | Status: DC | PRN

## 2022-11-20 MED ORDER — PROCHLORPERAZINE EDISYLATE 10 MG/2ML IJ SOLN: 5.0000 mg | Freq: Four times a day (QID) | INTRAMUSCULAR | Status: DC | PRN

## 2022-11-20 MED ORDER — ANTICOAGULANT SODIUM CITRATE 4% (200MG/5ML) IV SOLN
5.0000 mL | Status: DC | PRN
  Filled 2022-11-20: qty 5

## 2022-11-20 MED ORDER — PENTAFLUOROPROP-TETRAFLUOROETH EX AERO: 1.0000 | INHALATION_SPRAY | CUTANEOUS | Status: DC | PRN

## 2022-11-20 MED ORDER — DARBEPOETIN ALFA 40 MCG/0.4ML IJ SOSY
40.0000 ug | PREFILLED_SYRINGE | Freq: Once | INTRAMUSCULAR | Status: AC
  Administered 2022-11-20: 40 ug via SUBCUTANEOUS
  Filled 2022-11-20: qty 0.4

## 2022-11-20 MED ORDER — KETOROLAC TROMETHAMINE 15 MG/ML IJ SOLN
15.0000 mg | Freq: Once | INTRAMUSCULAR | Status: AC
  Administered 2022-11-20: 15 mg via INTRAVENOUS
  Filled 2022-11-20: qty 1

## 2022-11-20 MED ORDER — ALTEPLASE 2 MG IJ SOLR: 2.0000 mg | Freq: Once | INTRAMUSCULAR | Status: DC | PRN

## 2022-11-20 MED ORDER — SODIUM CHLORIDE 0.9 % IV SOLN
1.0000 g | INTRAVENOUS | Status: DC
  Administered 2022-11-20 – 2022-11-23 (×4): 1 g via INTRAVENOUS
  Filled 2022-11-20 (×6): qty 10

## 2022-11-20 MED ORDER — INSULIN ASPART 100 UNIT/ML IJ SOLN
0.0000 [IU] | INTRAMUSCULAR | Status: DC
  Administered 2022-11-20: 2 [IU] via SUBCUTANEOUS

## 2022-11-20 MED ORDER — TAMSULOSIN HCL 0.4 MG PO CAPS
0.4000 mg | ORAL_CAPSULE | Freq: Every day | ORAL | Status: DC
  Administered 2022-11-20 – 2022-11-23 (×4): 0.4 mg via ORAL
  Filled 2022-11-20 (×4): qty 1

## 2022-11-20 MED ORDER — CHLORHEXIDINE GLUCONATE CLOTH 2 % EX PADS: 6.0000 | MEDICATED_PAD | Freq: Every day | CUTANEOUS | Status: DC

## 2022-11-20 NOTE — ED Notes (Signed)
Pt transported to MRI 

## 2022-11-20 NOTE — Consult Note (Signed)
ORTHOPAEDIC CONSULTATION  REQUESTING PHYSICIAN: Barb Merino, MD  Chief Complaint: Cellulitis and swelling left transtibial amputation.  HPI: [name] is a 67 y.o. male who presents with swelling cellulitis and bleeding from the left transtibial amputation.  Patient complains of fever chills and pain in the residual limb.  Past Medical History:  Diagnosis Date   Carotid artery occlusion 11/10/10   LEFT CAROTID ENDARTERECTOMY   Chronic kidney disease    Complication of anesthesia    BP WENT UP AT [facility] "   COPD (chronic obstructive pulmonary disease) ([place])    pt denies this dx as of 06/01/20 - no inhaler    Diabetes mellitus without complication ([place])    Diverticulitis    Diverticulosis of colon (without mention of hemorrhage)    DJD (degenerative joint disease)    knees/hands/feet/back/neck   Fatty liver    Full dentures    GERD (gastroesophageal reflux disease)    H/O hiatal hernia    History of blood transfusion    with a past surical procedure per patient 06/01/20   Hyperlipidemia    Hypertension    Neuromuscular disorder ([place])    peripheral neuropathy   Non-pressure chronic ulcer of other part of left foot limited to breakdown of skin ([place]) 11/12/2016   Osteomyelitis (H[facility])    left 5th metatarsal   PAD (peripheral artery disease) ([place])    Distal aortogram June 2012. Atherectomy left popliteal artery July 2012.    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   Unstable angina ([place]) 09/16/2018   Wears glasses    Past Surgical History:  Procedure Laterality Date   ABDOMINAL AORTOGRAM W/LOWER EXTREMITY N/A 06/23/2021   Procedure: ABDOMINAL AORTOGRAM W/LOWER EXTREMITY;  Surgeon: Nigel Mormon, MD;  Location: [facility];  Service: Cardiovascular;  Laterality: N/A;   AMPUTATION  11/05/2011   Procedure: AMPUTATION RAY;  Surgeon: Wylene Simmer, MD;  Location: [facility];  Service: Orthopedics;  Laterality: Right;  Amputation of Right 4&5th Toes   AMPUTATION Left 11/26/2012   Procedure: AMPUTATION RAY;  Surgeon: Wylene Simmer, MD;  Location: [place];  Service: Orthopedics;  Laterality: Left;  fourth ray amputation   AMPUTATION Right 08/27/2014   Procedure: Transmetatarsal Amputation;  Surgeon: Newt Minion, MD;  Location: [place];  Service: Orthopedics;  Laterality: Right;   AMPUTATION Right 01/14/2015   Procedure: AMPUTATION BELOW KNEE;  Surgeon: Newt Minion, MD;  Location: [place];  Service: Orthopedics;  Laterality: Right;   AMPUTATION Left 10/21/2015   Procedure: Left Foot 5th Ray Amputation;  Surgeon: Newt Minion, MD;  Location: [place];  Service: Orthopedics;  Laterality: Left;   AMPUTATION Left 07/04/2022   Procedure: LEFT BELOW KNEE AMPUTATION;  Surgeon: Newt Minion, MD;  Location: [facility];  Service: Orthopedics;  Laterality: Left;   ANTERIOR FUSION CERVICAL SPINE  02/06/06   C4-5, C5-6, C6-7; SURGEON DR. MAX COHEN   AV FISTULA PLACEMENT Left 06/02/2020   Procedure: ARTERIOVENOUS (AV) FISTULA CREATION LEFT;  Surgeon: Waynetta Sandy, MD;  Location: [place];  Service: Vascular;  Laterality: Left;   BACK SURGERY     x 3   [place] Left 07/21/2020   Procedure: LEFT UPPER ARM ATERIOVENOUS SUPERFISTULALIZATION;  Surgeon: Waynetta Sandy, MD;  Location: [place];  Service: Vascular;  Laterality: Left;   BELOW KNEE LEG AMPUTATION Right    CARDIAC CATHETERIZATION  10/31/04   2009   CAROTID ENDARTERECTOMY  11/10/10   CAROTID ENDARTERECTOMY Left 11/10/2010   Subtotal occlusion of left internal carotid artery with left hemispheric transient ischemic attacks.   CAROTID STENT     CARPAL TUNNEL RELEASE Right 10/21/2013   Procedure: RIGHT CARPAL TUNNEL RELEASE;  Surgeon: Wynonia Sours, MD;  Location: [place];  Service: Orthopedics;  Laterality: Right;   CHOLECYSTECTOMY     COLON SURGERY      COLONOSCOPY     COLOSTOMY REVERSAL  05/21/2018   ileostomy reversal   CYSTOSCOPY WITH STENT PLACEMENT Bilateral 01/13/2018   Procedure: CYSTOSCOPY WITH BILATERAL URETERAL CATHETER PLACEMENT;  Surgeon: Ardis Hughs, MD;  [facility];  Service: Urology;  Laterality: Bilateral;   ESOPHAGEAL MANOMETRY Bilateral 07/19/2014   Procedure: ESOPHAGEAL MANOMETRY (EM);  Surgeon: Jerene Bears, MD;  [facility];  Service: Gastroenterology;  Laterality: Bilateral;   EYE SURGERY Bilateral 2020   cataract   FEMORAL ARTERY STENT     x6   FINGER SURGERY     FOOT SURGERY  04/25/2016    EXCISION BASE 5TH METATARSAL AND PARTIAL CUBOID LEFT FOOT   HERNIA REPAIR     LEFT INGUINAL AND UMBILICAL REPAIRS   HERNIA REPAIR     I & D EXTREMITY Left 04/25/2016   Procedure: EXCISION BASE 5TH METATARSAL AND PARTIAL CUBOID LEFT FOOT;  Surgeon: Newt Minion, MD;  Location: [facility];  Service: Orthopedics;  Laterality: Left;   ILEOSTOMY  01/13/2018   Procedure: ILEOSTOMY;  Surgeon: Clovis Riley, MD;  Location[facility];  Service: General;;   ILEOSTOMY CLOSURE N/A 05/21/2018   Procedure: ILEOSTOMY REVERSAL ERAS PATHWAY;  Surgeon: Clovis Riley, MD;  Location: [facility];  Service: General;  Laterality: N/A;   IR RADIOLOGIST EVAL & MGMT  11/19/2017   IR RADIOLOGIST EVAL & MGMT  12/03/2017   IR RADIOLOGIST EVAL & MGMT  12/18/2017   JOINT REPLACEMENT Right 2001   Total knee   LAMINECTOMY     X 3 LUMBAR AND X 2 CERVICAL SPINE OPERATIONS   LAPAROSCOPIC CHOLECYSTECTOMY W/ CHOLANGIOGRAPHY  11/09/04   SURGEON DR. [place] CATH AND CORONARY ANGIOGRAPHY N/A 09/16/2018   Procedure: LEFT HEART CATH AND CORONARY ANGIOGRAPHY;  Surgeon: Nigel Mormon, MD;  Location: [facility];  Service: Cardiovascular;  Laterality: N/A;   LEFT HEART CATHETERIZATION WITH CORONARY ANGIOGRAM N/A 10/29/2014   Procedure: LEFT HEART CATHETERIZATION WITH CORONARY ANGIOGRAM;  Surgeon: Laverda Page, MD;   Location: [facility];  Service: Cardiovascular;  Laterality: N/A;   LIGATION OF COMPETING BRANCHES OF ARTERIOVENOUS FISTULA Left 07/21/2020   Procedure: LIGATION OF COMPETING BRANCHES OF LEFT UPPER ARM ARTERIOVENOUS FISTULA;  Surgeon: Waynetta Sandy, MD;  Location: [place];  Service: Vascular;  Laterality: Left;   LOWER EXTREMITY ANGIOGRAM N/A 03/19/2012   Procedure: LOWER EXTREMITY ANGIOGRAM;  Surgeon: Burnell Blanks, MD;  Location: [facility];  Service: Cardiovascular;  Laterality: N/A;   LOWER EXTREMITY ANGIOGRAPHY N/A 06/20/2021   Procedure: LOWER EXTREMITY ANGIOGRAPHY;  Surgeon: Nigel Mormon, MD;  Location: [place] [facility];  Service: Cardiovascular;  Laterality: N/A;   NECK SURGERY     PARTIAL COLECTOMY N/A 01/13/2018   Procedure: LAPAROSCOPIC ASSISTED   SIGMOID COLECTOMY ILEOSTOMY;  Surgeon: Clovis Riley, MD;  [facility];  Service: General;  Laterality: N/A;   PENILE PROSTHESIS IMPLANT  08/14/05   INFRAPUBIC INSERTION OF INFLATABLE PENILE PROSTHESIS; SURGEON DR. Amalia Hailey   PENILE PROSTHESIS IMPLANT     PERCUTANEOUS CORONARY STENT INTERVENTION (PCI-S) Right 10/29/2014   Procedure: PERCUTANEOUS CORONARY STENT INTERVENTION (PCI-S);  Surgeon: Laverda Page, MD;  Location: [facility];  Service: Cardiovascular;  Laterality: Right;   PERIPHERAL VASCULAR INTERVENTION Left 06/23/2021   Procedure: PERIPHERAL VASCULAR INTERVENTION;  Surgeon: Nigel Mormon, MD;  Location: [facility];  Service: Cardiovascular;  Laterality: Left;   REMOVAL OF PENILE PROSTHESIS N/A 06/14/2021   Procedure: Removal of THREE piece inflatable penile prosthesis;  Surgeon: Lucas Mallow, MD;  Location: [place];  Service: Urology;  Laterality: N/A;   SHOULDER ARTHROSCOPY     SPINE SURGERY     TOE AMPUTATION Left    TONSILLECTOMY     TOTAL KNEE ARTHROPLASTY  07/2002   RIGHT KNEE ; SURGEON  DR. [name] ALSO HAD ARTHROSCOPIC RIGHT KNEE IN  10/2001   TOTAL KNEE ARTHROPLASTY      ULNAR NERVE TRANSPOSITION Right 10/21/2013   Procedure: RIGHT ELBOW  ULNAR NERVE DECOMPRESSION;  Surgeon: Wynonia Sours, MD;  Location: [place];  Service: Orthopedics;  Laterality: Right;   Social History   Socioeconomic History   Marital status: Married    Spouse name: Not on file   Number of children: 3   Years of education: Not on file   Highest education level: Not on file  Occupational History   Occupation: [employer]    Employer: UNEMPLOYED  Tobacco Use   Smoking status: Former    Packs/day: 2.00    Years: 35.00    Total pack years: 70.00    Types: Cigarettes    Quit date: 10/28/2011    Years since quitting: 11.0   Smokeless tobacco: Never  Vaping Use   Vaping Use: Some days   Substances: Nicotine  Substance and Sexual Activity   Alcohol use: Not Currently    Comment: "not in a long time"   Drug use: Never   Sexual activity: Yes    Birth control/protection: Implant    Comment: penile implant  Other Topics Concern   Not on file  Social History Narrative   ** Merged History Encounter **       HEAVY SMOKER AND CONTINUES TO SMOKE 1 PPD. DOES NOT EXERCISE REGULARLY.  Wife [phone] Lebron Quam). Has 2 sons and daughter. Still active       Social Determinants of Health   Financial Resource Strain: Not on file  Food Insecurity: No Food Insecurity (11/20/2022)   Hunger Vital Sign    Worried About Running Out of Food in the Last Year: Never true    Ran Out of Food in the Last Year: Never true  Transportation Needs: No Transportation Needs (11/20/2022)   PRAPARE - Hydrologist (Medical): No    Lack of Transportation (Non-Medical): No  Physical Activity: Not on file  Stress: Not on file  Social Connections: Not on file   Family History  Problem Relation Age of Onset   Heart disease Father        Before age 24-  CAD, BPG   Diabetes Father        Amputation   Cancer Father        PROSTATE   Hyperlipidemia  Father    Hypertension Father    Heart attack Father  Triple BPG   Varicose Veins Father    Cancer Sister        Breast   Hyperlipidemia Sister    Hypertension Sister    Heart attack Brother    Colon cancer Brother    Diabetes Brother    Heart disease Brother 81       A-Fib. Before age 45   Hyperlipidemia Brother    Hypertension Brother    Hypertension Son    Arthritis Other        GRANDMOTHER   Hypertension Other        OTHER FAMILY MEMBERS   - negative except otherwise stated in the family history section Allergies  Allergen Reactions   Contrast Media [Iodinated Contrast Media] Shortness Of Breath and Other (See Comments)    Difficulty breathing and altered mental status     Ivp Dye [Iodinated Contrast Media] Anaphylaxis, Shortness Of Breath and Other (See Comments)    Breathing problems, altered mental state    Adhesive [Tape] Rash and Other (See Comments)    Rash after 1 day of use Do not leave on longer then 3 days with out be changed   Latex Rash and Other (See Comments)    A severe rash appears after the first 24 hours of being placed   Prior to Admission medications   Medication Sig Start Date End Date Taking? Authorizing Provider  amiodarone (PACERONE) 200 MG tablet Take 1 tablet (200 mg total) by mouth daily. 10/29/22   Adrian Prows, MD  amLODipine (NORVASC) 5 MG tablet Take 5 mg by mouth daily as needed for dizziness. Take one tablet for BP > 160 mm Hg 01/30/21   [provider]  apixaban (ELIQUIS) 5 MG TABS tablet Take 1 tablet (5 mg total) by mouth 2 (two) times daily. 01/14/22 10/29/22  Mercy Riding, MD  atorvastatin (LIPITOR) 40 MG tablet Take 40 mg by mouth at bedtime.     [provider]  B Complex-C-Zn-Folic Acid (DIALYVITE/ZINC) TABS Take 1 tablet by mouth Every Tuesday,Thursday,and Saturday with dialysis. 10/04/21   [provider]  famotidine (PEPCID) 20 MG tablet Take 20 mg by mouth every other day.    [provider]  fentaNYL (DURAGESIC) 50 MCG/HR Place 1 patch onto the skin every 3 (three) days. 07/10/22   Newt Minion, MD  folic acid (FOLVITE) 1 MG tablet Take 1 mg by mouth daily. 09/18/21   [provider]  Glucosamine HCl (GLUCOSAMINE PO) Take 1 tablet by mouth 2 (two) times daily.    [provider]  insulin glargine (LANTUS) 100 UNIT/ML injection Inject 0.2 mLs (20 Units total) into the skin at bedtime. Patient taking differently: Inject 30 Units into the skin at bedtime. 06/28/22   [name], MD  insulin lispro (HUMALOG) 100 UNIT/ML injection Inject 4-13 Units into the skin 3 (three) times daily before meals. Only with meals if needed    [provider]  ketoconazole (NIZORAL) 2 % cream Apply 1 application topically 2 (two) times daily as needed for irritation.  03/07/18   [provider]  lidocaine-prilocaine (EMLA) cream Apply 1 application topically See admin instructions. Prior to Dialysis days Tuesday,Thursday and saturday 06/06/21   [provider]  loratadine (CLARITIN) 10 MG tablet Take 10 mg by mouth daily.    [provider]  metoCLOPramide (REGLAN) 5 MG tablet Take 1 tablet (5 mg total) by mouth 4 (four) times daily -  before meals and at bedtime.  Patient taking differently: Take 5 mg by mouth 2 (two) times daily. 01/14/22 10/29/22  Mercy Riding, MD  midodrine (PROAMATINE) 10 MG tablet Take 1 tablet (10 mg total) by mouth every other day. One hour before dialysis Patient taking differently: Take 10 mg by mouth Every Tuesday,Thursday,and Saturday with dialysis. One hour before dialysis 09/07/21   Barb Merino, MD  NEEDLE, REUSABLE, 22 G 22G X 1-1/2" MISC 1 Units by Does not apply route as directed. For B12 IM inj 02/15/18   Elgergawy, Silver Huguenin, MD  nitroGLYCERIN (NITROSTAT) 0.4 MG SL tablet Place 0.4 mg under the tongue every 5 (five) minutes as needed for chest pain. 01/06/22   [provider]  NOVOLOG FLEXPEN 100 UNIT/ML  FlexPen Inject 4 Units into the skin 3 (three) times daily. 06/28/22   [name], MD  omega-3 acid ethyl esters (LOVAZA) 1 g capsule Take 2 g by mouth 2 (two) times daily. Mini    [provider]  ondansetron (ZOFRAN-ODT) 4 MG disintegrating tablet Take 1 tablet by mouth 4 (four) times daily as needed for nausea/vomiting. 05/28/22   [provider]  oxycodone-acetaminophen (LYNOX) 5-300 MG tablet Take 1 tablet by mouth every 4 (four) hours as needed for pain.    [provider]  oxyCODONE-acetaminophen (PERCOCET) 7.5-325 MG tablet Take 1 tablet by mouth every 4 (four) hours as needed for severe pain. 07/10/22 07/10/23  Newt Minion, MD  pantoprazole (PROTONIX) 40 MG tablet Take 1 tablet (40 mg total) by mouth 2 (two) times daily for 30 days, THEN 1 tablet (40 mg total) daily. Patient taking differently:  1 tablet (40 mg total) daily. 01/14/22 10/29/22  Mercy Riding, MD  Polyvinyl Alcohol-Povidone PF 1.4-0.6 % SOLN Place 1 drop into both eyes 2 (two) times daily as needed (dry eyes).     [provider]  pregabalin (LYRICA) 100 MG capsule Take 100 mg by mouth 2 (two) times daily. 09/18/21   [provider]  rOPINIRole (REQUIP) 2 MG tablet Take 2 mg by mouth at bedtime.    [provider]  Syringe/Needle, Disp, (SYRINGE 3CC/22GX1-1/2") 22G X 1-1/2" 3 ML MISC 1 Syringe by Does not apply route as directed. For b12 IM inj 02/15/18   Elgergawy, Silver Huguenin, MD  tamsulosin (FLOMAX) 0.4 MG CAPS capsule Take 0.4 mg by mouth daily.    [provider]   MR TIBIA FIBULA LEFT WO CONTRAST  Result Date: 11/20/2022 CLINICAL DATA:  Pain and swelling involving the left lower extremity. History of below-knee amputation. EXAM: MRI OF LOWER LEFT EXTREMITY WITHOUT CONTRAST TECHNIQUE: Multiplanar, multisequence MR imaging of the left lower extremity was performed. No intravenous contrast was administered. COMPARISON:  CT scan, same date. FINDINGS: Diffuse  subcutaneous soft tissue swelling/edema and skin thickening consistent with cellulitis. There is also significant myofasciitis involving the below knee musculature. As demonstrated on the CT scan there is a large abscess wrapping around the amputation site of the tibia. This measures approximately 5.8 x 3.5 cm. This is adjacent to the amputation site and other low there is no gross changes of osteomyelitis in the tibia there is mild T2 signal abnormality suggesting early osteomyelitis. No findings for septic arthritis at the knee joint. IMPRESSION: 1. 5.8 x 3.5 cm abscess wrapping around the amputation site of the tibia. 2. Mild T2 signal abnormality in the tibia suggesting early osteomyelitis. 3. Diffuse cellulitis and myofasciitis. Electronically Signed   By: Marijo Sanes M.D.   On: 11/20/2022  07:19   CT EXTREMITY LOWER LEFT WO CONTRAST  Result Date: 11/20/2022 CLINICAL DATA:  Leg pain with redness and swelling EXAM: CT OF THE LOWER LEFT EXTREMITY WITHOUT CONTRAST TECHNIQUE: Multidetector CT imaging of the lower left extremity was performed according to the standard protocol. RADIATION DOSE REDUCTION: This exam was performed according to the departmental dose-optimization program which includes automated exposure control, adjustment of the mA and/or kV according to patient size and/or use of iterative reconstruction technique. COMPARISON:  CT 06/23/2022 FINDINGS: Bones/Joint/Cartilage No significant left hip effusion. No acute fracture or malalignment. No frank osseous destructive change. Small suprapatellar knee effusion. Status post below the knee amputation with relatively smooth cut margins. Moderate patellofemoral degenerative changes. At least mild medial and lateral tibiofemoral degenerative change. Ligaments Suboptimally assessed by CT. Muscles and Tendons Negative for intramuscular fluid collection.  Mild fatty atrophy. Soft tissues Prominent left inguinal lymph nodes measuring up to 11 mm. Moderate  severe subcutaneous fluid and edema worst at the level of distal thigh, knee and proximal lower leg. Suspicion of focal fluid collection within the distal stump, this measures 5.9 by 3.9 by 2.3 cm and is distal to the cut margin of the tibia. No internal gas bubbles. IMPRESSION: 1. Status post left below the knee amputation. Moderate to severe subcutaneous fluid and edema worst at the level of the distal thigh, knee and proximal lower leg. Suspicion of focal fluid collection within the distal stump, this measures 5.9 x 3.9 x 2.3 cm and is distal to the cut margin of the tibia. No internal gas bubbles but concern is raised for soft tissue abscess. No convincing osseous destructive change at the cut margins of the tibia and fibula. Further assessment with MRI may be considered. 2. Small suprapatellar knee effusion. Electronically Signed   By: Donavan Foil M.D.   On: 11/20/2022 00:49   DG Chest Portable 1 View  Result Date: 11/19/2022 CLINICAL DATA:  Sepsis EXAM: PORTABLE CHEST 1 VIEW COMPARISON:  Chest x-ray dated July 03, 2022 FINDINGS: Cardiac and mediastinal contours are unchanged. Mild linear left perihilar opacities, likely due to atelectasis. Lungs are otherwise clear. No evidence of pleural effusion or pneumothorax. IMPRESSION: Mild linear left perihilar opacities, likely due to atelectasis. Electronically Signed   By: Yetta Glassman M.D.   On: 11/19/2022 21:51   - pertinent xrays, CT, MRI studies were reviewed and independently interpreted  Positive ROS: All other systems have been reviewed and were otherwise negative with the exception of those mentioned in the HPI and as above.  Physical Exam: General: Alert, no acute distress Psychiatric: Patient is competent for consent with normal mood and affect Lymphatic: No axillary or cervical lymphadenopathy Cardiovascular: No pedal edema Respiratory: No cyanosis, no use of accessory musculature GI: No organomegaly, abdomen is soft and  non-tender    Images:  '@ENCIMAGES'$ @  Labs:  Lab Results  Component Value Date   HGBA1C 7.6 (H) 01/11/2022   HGBA1C 7.9 (H) 06/14/2021   HGBA1C 5.7 (H) 09/16/2018   ESRSEDRATE 26 (H) 06/23/2022   ESRSEDRATE 40 (H) 01/13/2022   ESRSEDRATE 55 (H) 01/11/2022   CRP 14.0 (H) 06/23/2022   CRP 3.6 (H) 01/13/2022   CRP 4.2 (H) 01/11/2022   REPTSTATUS PENDING 11/19/2022   GRAMSTAIN  06/14/2021    NO ORGANISMS SEEN SQUAMOUS EPITHELIAL CELLS PRESENT ABUNDANT WBC PRESENT,BOTH PMN AND MONONUCLEAR MODERATE GRAM POSITIVE COCCI    CULT  11/19/2022    NO GROWTH < 12 HOURS Performed at [facility], [address] [place], [place] [address]    LABORGA GROUP B STREP(S.AGALACTIAE)ISOLATED 06/23/2022    Lab Results  Component Value Date   ALBUMIN 2.2 (L) 11/20/2022   ALBUMIN 2.8 (L) 11/19/2022   ALBUMIN 2.4 (L) 07/14/2022   PREALBUMIN 18 06/23/2022   PREALBUMIN 19.0 05/04/2016   PREALBUMIN 25.0 12/16/2015        Latest Ref Rng & Units 11/20/2022    4:37 AM 11/19/2022    8:58 PM 07/14/2022    8:03 AM  CBC EXTENDED  WBC 4.0 - 10.5 K/uL 10.1  10.6  7.0   RBC 4.22 - 5.81 MIL/uL 3.20  3.68  2.96   Hemoglobin 13.0 - 17.0 g/dL 9.5  11.2  8.9   HCT 39.0 - 52.0 % 29.5  33.6  26.6   Platelets 150 - 400 K/uL 169  202  276   NEUT# 1.7 - 7.7 K/uL  8.3    Lymph# 0.7 - 4.0 K/uL  1.2      Neurologic: Patient does not have protective sensation bilateral lower extremities.   MUSCULOSKELETAL:   Skin: Examination there is cellulitis and pitting edema of the left lower extremity.  There is bloody drainage from the lateral surgical incision.  Patient is over 4 months out from surgery.  Laboratory studies shows a white cell count of 10.1 and hemoglobin 9.6.  Albumin 2.2 with a hemoglobin A1c of 7.6.  Review of the MRI scan shows a fluid collection at the distal tibia and there is a very small amount of edema in the bone.  No significant bony changes.  Assessment: Assessment: Draining  hematoma left transtibial amputation with cellulitis and swelling.  Plan: Plan: Would continue IV antibiotics.  With the bloody drainage this appears to be more of a hematoma than a abscess.  The very small amount of edema in the bone seems minimal and may resolve with antibiotics.  I will continue to follow the patient but do not feel he needs revision surgery at this time.  Thank you for the consult and the opportunity to see Mr. Joycie Peek, [place] [phone] 10:07 AM

## 2022-11-20 NOTE — ED Notes (Signed)
Pt is alert. Pt's leg is at [facility] and was not brought here. He has stump cover on but not leg.

## 2022-11-20 NOTE — ED Notes (Signed)
One set of blood cultures collected prior to antibiotic administration, second set not obtained prior to prevent delay of antibiotic administration

## 2022-11-20 NOTE — Consult Note (Signed)
[facility] Renal Consultation Note  Requesting MD: Barb Merino, MD Indication for Consultation:  ESRD  Chief complaint: left leg pain   HPI:  [name] is a 67 y.o. male with a history of ESRD on HD TTS, HTN, HFpEF, and PAD with prior BKA who presented to the hospital with left residual limb pain and swelling as well as confusion.  He states that his wife was concerned that he had an infection due to the confusion.  He feels back to normal now in terms of mental status.  He was febrile in the ER and medicine was called for admission.  He had CT which demonstrated subcutaneous fluid and edema at the level of the distal thigh and concern for abscess; also noted knee effusion.  Follow-up MRI left tib/fib demonstrated abscess and early osteomyelitis.  He has gotten cefepime and vanc.  Nephrology is consulted for management of ESRD and dialysis.  He states left BKA was 06/2022.     PMHx:   Past Medical History:  Diagnosis Date   Carotid artery occlusion 11/10/10   LEFT CAROTID ENDARTERECTOMY   Chronic kidney disease    Complication of anesthesia    BP WENT UP AT [facility] "   COPD (chronic obstructive pulmonary disease) ([place])    pt denies this dx as of 06/01/20 - no inhaler    Diabetes mellitus without complication ([place])    Diverticulitis    Diverticulosis of colon (without mention of hemorrhage)    DJD (degenerative joint disease)    knees/hands/feet/back/neck   Fatty liver    Full dentures    GERD (gastroesophageal reflux disease)    H/O hiatal hernia    History of blood transfusion    with a past surical procedure per patient 06/01/20   Hyperlipidemia    Hypertension    Neuromuscular disorder ([place])    peripheral neuropathy   Non-pressure chronic ulcer of other part of left foot limited to breakdown of skin ([place]) 11/12/2016   Osteomyelitis (HCC)    left 5th metatarsal   PAD (peripheral artery disease) ([place])    Distal aortogram June 2012. Atherectomy left popliteal  artery July 2012.    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   Unstable angina ([place]) 09/16/2018   Wears glasses     Past Surgical History:  Procedure Laterality Date   ABDOMINAL AORTOGRAM W/LOWER EXTREMITY N/A 06/23/2021   Procedure: ABDOMINAL AORTOGRAM W/LOWER EXTREMITY;  Surgeon: Nigel Mormon, MD;  Location: [place] [facility];  Service: Cardiovascular;  Laterality: N/A;   AMPUTATION  11/05/2011   Procedure: AMPUTATION RAY;  Surgeon: Wylene Simmer, MD;  Location: [place];  Service: Orthopedics;  Laterality: Right;  Amputation of Right 4&5th Toes   AMPUTATION Left 11/26/2012   Procedure: AMPUTATION RAY;  Surgeon: Wylene Simmer, MD;  Location: [place];  Service: Orthopedics;  Laterality: Left;  fourth ray amputation   AMPUTATION Right 08/27/2014   Procedure: Transmetatarsal Amputation;  Surgeon: Newt Minion, MD;  Location: [place];  Service: Orthopedics;  Laterality: Right;   AMPUTATION Right 01/14/2015   Procedure: AMPUTATION BELOW KNEE;  Surgeon: Newt Minion, MD;  Location: [place];  Service: Orthopedics;  Laterality: Right;   AMPUTATION Left 10/21/2015   Procedure: Left Foot 5th Ray Amputation;  Surgeon: Newt Minion, MD;  Location: [place];  Service: Orthopedics;  Laterality: Left;   AMPUTATION Left 07/04/2022   Procedure: LEFT BELOW KNEE AMPUTATION;  Surgeon: Newt Minion, MD;  Location: [employer];  Service: Orthopedics;  Laterality: Left;   ANTERIOR FUSION CERVICAL SPINE  02/06/06   C4-5, C5-6, C6-7; SURGEON DR. MAX COHEN   AV FISTULA PLACEMENT Left 06/02/2020   Procedure: ARTERIOVENOUS (AV) FISTULA CREATION LEFT;  Surgeon: Waynetta Sandy, MD;  Location: [place];  Service: Vascular;  Laterality: Left;   BACK SURGERY     x 3   [place] Left 07/21/2020   Procedure: LEFT UPPER ARM ATERIOVENOUS SUPERFISTULALIZATION;  Surgeon: Waynetta Sandy, MD;  Location: [place];  Service: Vascular;  Laterality: Left;   BELOW KNEE LEG AMPUTATION Right    CARDIAC CATHETERIZATION  10/31/04   2009   CAROTID ENDARTERECTOMY  11/10/10   CAROTID ENDARTERECTOMY Left 11/10/2010   Subtotal occlusion of left internal carotid artery with left hemispheric transient ischemic attacks.   CAROTID STENT     CARPAL TUNNEL RELEASE Right 10/21/2013   Procedure: RIGHT CARPAL TUNNEL RELEASE;  Surgeon: Wynonia Sours, MD;  Location: [place];  Service: Orthopedics;  Laterality: Right;   CHOLECYSTECTOMY     COLON SURGERY     COLONOSCOPY     COLOSTOMY REVERSAL  05/21/2018   ileostomy reversal   CYSTOSCOPY WITH STENT PLACEMENT Bilateral 01/13/2018   Procedure: CYSTOSCOPY WITH BILATERAL URETERAL CATHETER PLACEMENT;  Surgeon: Ardis Hughs, MD;  Location: [facility];  Service: Urology;  Laterality: Bilateral;   ESOPHAGEAL MANOMETRY Bilateral 07/19/2014   Procedure: ESOPHAGEAL MANOMETRY (EM);  Surgeon: Jerene Bears, MD;  Location: [facility];  Service: Gastroenterology;  Laterality: Bilateral;   EYE SURGERY Bilateral 2020   cataract   FEMORAL ARTERY STENT     x6   FINGER SURGERY     FOOT SURGERY  04/25/2016    EXCISION BASE 5TH METATARSAL AND PARTIAL CUBOID LEFT FOOT   HERNIA REPAIR     LEFT INGUINAL AND UMBILICAL REPAIRS   HERNIA REPAIR     I & D EXTREMITY Left 04/25/2016   Procedure: EXCISION BASE 5TH METATARSAL AND PARTIAL CUBOID LEFT FOOT;  Surgeon: Newt Minion, MD;  Location: [place];  Service: Orthopedics;  Laterality: Left;   ILEOSTOMY  01/13/2018   Procedure: ILEOSTOMY;  Surgeon: Clovis Riley, MD;  Location: [facility];  Service: General;;   ILEOSTOMY CLOSURE N/A 05/21/2018   Procedure: ILEOSTOMY REVERSAL ERAS PATHWAY;  Surgeon: Clovis Riley, MD;  Location: [place];  Service: General;  Laterality: N/A;   IR RADIOLOGIST EVAL & MGMT  11/19/2017   IR RADIOLOGIST EVAL & MGMT  12/03/2017   IR RADIOLOGIST EVAL & MGMT  12/18/2017   JOINT  REPLACEMENT Right 2001   Total knee   LAMINECTOMY     X 3 LUMBAR AND X 2 CERVICAL SPINE OPERATIONS   LAPAROSCOPIC CHOLECYSTECTOMY W/ CHOLANGIOGRAPHY  11/09/04   SURGEON DR. [place] CATH AND CORONARY ANGIOGRAPHY N/A 09/16/2018   Procedure: LEFT HEART CATH AND CORONARY ANGIOGRAPHY;  Surgeon: Nigel Mormon, MD;  Location: [place] [facility];  Service: Cardiovascular;  Laterality: N/A;   LEFT HEART CATHETERIZATION WITH CORONARY ANGIOGRAM N/A 10/29/2014   Procedure: LEFT HEART CATHETERIZATION WITH CORONARY ANGIOGRAM;  Surgeon: Laverda Page, MD;  Location: [facility];  Service: Cardiovascular;  Laterality: N/A;   LIGATION OF COMPETING BRANCHES OF ARTERIOVENOUS FISTULA Left 07/21/2020   Procedure: LIGATION OF COMPETING  BRANCHES OF LEFT UPPER ARM ARTERIOVENOUS FISTULA;  Surgeon: Waynetta Sandy, MD;  Location: [place];  Service: Vascular;  Laterality: Left;   LOWER EXTREMITY ANGIOGRAM N/A 03/19/2012   Procedure: LOWER EXTREMITY ANGIOGRAM;  Surgeon: Burnell Blanks, MD;  Location: [facility] CATH LAB;  Service: Cardiovascular;  Laterality: N/A;   LOWER EXTREMITY ANGIOGRAPHY N/A 06/20/2021   Procedure: LOWER EXTREMITY ANGIOGRAPHY;  Surgeon: Nigel Mormon, MD;  Location: [place] [facility];  Service: Cardiovascular;  Laterality: N/A;   NECK SURGERY     PARTIAL COLECTOMY N/A 01/13/2018   Procedure: LAPAROSCOPIC ASSISTED   SIGMOID COLECTOMY ILEOSTOMY;  Surgeon: Clovis Riley, MD;  Location: [facility];  Service: General;  Laterality: N/A;   PENILE PROSTHESIS IMPLANT  08/14/05   INFRAPUBIC INSERTION OF INFLATABLE PENILE PROSTHESIS; SURGEON DR. Amalia Hailey   PENILE PROSTHESIS IMPLANT     PERCUTANEOUS CORONARY STENT INTERVENTION (PCI-S) Right 10/29/2014   Procedure: PERCUTANEOUS CORONARY STENT INTERVENTION (PCI-S);  Surgeon: Laverda Page, MD;  [facility];  Service: Cardiovascular;  Laterality: Right;   PERIPHERAL VASCULAR INTERVENTION Left 06/23/2021    Procedure: PERIPHERAL VASCULAR INTERVENTION;  Surgeon: Nigel Mormon, MD;  Location: [place] [facility];  Service: Cardiovascular;  Laterality: Left;   REMOVAL OF PENILE PROSTHESIS N/A 06/14/2021   Procedure: Removal of THREE piece inflatable penile prosthesis;  Surgeon: Lucas Mallow, MD;  Location: [place];  Service: Urology;  Laterality: N/A;   SHOULDER ARTHROSCOPY     SPINE SURGERY     TOE AMPUTATION Left    TONSILLECTOMY     TOTAL KNEE ARTHROPLASTY  07/2002   RIGHT KNEE ; SURGEON  DR. [name] ALSO HAD ARTHROSCOPIC RIGHT KNEE IN  10/2001   TOTAL KNEE ARTHROPLASTY     ULNAR NERVE TRANSPOSITION Right 10/21/2013   Procedure: RIGHT ELBOW  ULNAR NERVE DECOMPRESSION;  Surgeon: Wynonia Sours, MD;  Location: [place];  Service: Orthopedics;  Laterality: Right;    Family Hx:  Family History  Problem Relation Age of Onset   Heart disease Father        Before age 85-  CAD, BPG   Diabetes Father        Amputation   Cancer Father        PROSTATE   Hyperlipidemia Father    Hypertension Father    Heart attack Father        Triple BPG   Varicose Veins Father    Cancer Sister        Breast   Hyperlipidemia Sister    Hypertension Sister    Heart attack Brother    Colon cancer Brother    Diabetes Brother    Heart disease Brother 72       A-Fib. Before age 74   Hyperlipidemia Brother    Hypertension Brother    Hypertension Son    Arthritis Other        GRANDMOTHER   Hypertension Other        OTHER FAMILY MEMBERS  No family history of ESRD    Social History:  reports that he quit smoking about 11 years ago. His smoking use included cigarettes. He has a 70.00 pack-year smoking history. He has never used smokeless tobacco. He reports that he does not currently use alcohol. He reports that he does not use drugs.  Allergies:  Allergies  Allergen Reactions   Contrast Media [Iodinated Contrast Media] Shortness Of Breath and Other (See Comments)    Difficulty  breathing and altered mental status     Ivp Dye [Iodinated Contrast Media] Anaphylaxis, Shortness Of Breath and Other (See Comments)    Breathing problems, altered mental state    Adhesive [Tape] Rash and Other (See Comments)    Rash after 1 day of use Do not leave on longer then 3 days with out be changed   Latex Rash and Other (See Comments)    A severe rash appears after the first 24 hours of being placed    Medications: Prior to Admission medications   Medication Sig Start Date End Date Taking? Authorizing Provider  amiodarone (PACERONE) 200 MG tablet Take 1 tablet (200 mg total) by mouth daily. Patient taking differently: Take 100 mg by mouth daily. 10/29/22  Yes Adrian Prows, MD  amLODipine (NORVASC) 5 MG tablet Take 5 mg by mouth daily as needed for dizziness. Take one tablet for BP > 160 mm Hg 01/30/21  Yes [provider]  apixaban (ELIQUIS) 5 MG TABS tablet Take 1 tablet (5 mg total) by mouth 2 (two) times daily. 01/14/22 10/29/22 Yes Mercy Riding, MD  atorvastatin (LIPITOR) 40 MG tablet Take 40 mg by mouth at bedtime.    Yes [provider]  B Complex-C-Zn-Folic Acid (DIALYVITE/ZINC) TABS Take 1 tablet by mouth Every Tuesday,Thursday,and Saturday with dialysis. 10/04/21  Yes [provider]  famotidine (PEPCID) 20 MG tablet Take 20 mg by mouth every other day.   Yes [provider]  fentaNYL (DURAGESIC) 50 MCG/HR Place 1 patch onto the skin every 3 (three) days. 07/10/22  Yes Newt Minion, MD  folic acid (FOLVITE) 1 MG tablet Take 1 mg by mouth daily. 09/18/21  Yes [provider]  Glucosamine HCl (GLUCOSAMINE PO) Take 500 mg by mouth 2 (two) times daily.   Yes [provider]  insulin aspart (NOVOLOG FLEXPEN) 100 UNIT/ML FlexPen Inject 0-12 Units into the skin See admin instructions. Inject as per sliding scale: If 0 - 69 = 0 initiate hypoglycemia orders;  70 - 150 = 0;  151 - 200 = 2;  201 - 250 = 4;  251 - 300 = 6;  301 - 350  = 8;  351 - 400 = 10;  401 - 450 = 12.  If >400, give 12 units, recheck in 1 hour. If still >400, call MD/NP for further orders. Subcutaneously with meals in addition to scheduled 4 unites with meals.   Yes [provider]  insulin glargine (LANTUS) 100 UNIT/ML injection Inject 0.2 mLs (20 Units total) into the skin at bedtime. 06/28/22  Yes [name], MD  lidocaine-prilocaine (EMLA) cream Apply 1 application topically See admin instructions. Prior to Dialysis days Tuesday,Thursday and saturday 06/06/21  Yes [provider]  loratadine (CLARITIN) 10 MG tablet Take 10 mg by mouth daily.   Yes [provider]  metoCLOPramide (REGLAN) 5 MG tablet Take 1 tablet (5 mg total) by mouth 4 (four) times daily -  before meals and at bedtime. Patient taking differently: Take 5 mg by mouth in the morning, at noon, in the evening, and at bedtime. 01/14/22 10/29/22 Yes Mercy Riding, MD  midodrine (PROAMATINE) 10 MG tablet Take 1 tablet (10 mg total) by mouth every other day. One hour before dialysis Patient taking differently: Take 10 mg by mouth Every Tuesday,Thursday,and Saturday with dialysis. One hour before dialysis 09/07/21  Yes Ghimire, Dante Gang, MD  nitroGLYCERIN (NITROSTAT) 0.4 MG SL tablet Place 0.4 mg under the tongue every 5 (  five) minutes as needed for chest pain. 01/06/22  Yes [provider]  NOVOLOG FLEXPEN 100 UNIT/ML FlexPen Inject 4 Units into the skin 3 (three) times daily. Patient taking differently: Inject 4 Units into the skin 3 (three) times daily. Give in addition to sliding scale insulin with meals. 06/28/22  Yes [name], MD  omega-3 acid ethyl esters (LOVAZA) 1 g capsule Take 2 g by mouth 2 (two) times daily.   Yes [provider]  ondansetron (ZOFRAN-ODT) 4 MG disintegrating tablet Take 1 tablet by mouth 4 (four) times daily as needed for nausea/vomiting. 05/28/22  Yes [provider]  oxyCODONE-acetaminophen  (PERCOCET) 5-325 mg TABS tablet Take 1 tablet by mouth every 8 (eight) hours as needed (As needed for pain).   Yes [provider]  pantoprazole (PROTONIX) 40 MG tablet Take 1 tablet (40 mg total) by mouth 2 (two) times daily for 30 days, THEN 1 tablet (40 mg total) daily. Patient taking differently:  1 tablet (40 mg total) twice daily. 01/14/22 10/29/22 Yes Mercy Riding, MD  Polyvinyl Alcohol-Povidone PF 1.4-0.6 % SOLN Place 1 drop into both eyes 2 (two) times daily as needed (dry eyes).    Yes [provider]  pregabalin (LYRICA) 100 MG capsule Take 100 mg by mouth 2 (two) times daily. 09/18/21  Yes [provider]  rOPINIRole (REQUIP) 2 MG tablet Take 2 mg by mouth at bedtime.   Yes [provider]  senna-docusate (SENOKOT-S) 8.6-50 MG tablet Take 2 tablets by mouth at bedtime.   Yes [provider]  tamsulosin (FLOMAX) 0.4 MG CAPS capsule Take 0.4 mg by mouth daily.   Yes [provider]  NEEDLE, REUSABLE, 22 G 22G X 1-1/2" MISC 1 Units by Does not apply route as directed. For B12 IM inj 02/15/18   Elgergawy, Silver Huguenin, MD  oxyCODONE-acetaminophen (PERCOCET) 7.5-325 MG tablet Take 1 tablet by mouth every 4 (four) hours as needed for severe pain. Patient not taking: Reported on 11/20/2022 07/10/22 07/10/23  Newt Minion, MD  Syringe/Needle, Disp, (SYRINGE 3CC/22GX1-1/2") 22G X 1-1/2" 3 ML MISC 1 Syringe by Does not apply route as directed. For b12 IM inj 02/15/18   Elgergawy, Silver Huguenin, MD    I have reviewed the patient's current and reported prior to admission medications.  Labs:     Latest Ref Rng & Units 11/20/2022    4:37 AM 11/19/2022    8:58 PM 07/14/2022    8:03 AM  BMP  Glucose 70 - 99 mg/dL 241  313  193   BUN 8 - 23 mg/dL 49  42  76   Creatinine 0.61 - 1.24 mg/dL 7.12  6.90  8.68   Sodium 135 - 145 mmol/L 134  134  130   Potassium 3.5 - 5.1 mmol/L 3.8  3.8  4.5   Chloride 98 - 111 mmol/L 96  92  90   CO2 22 - 32 mmol/L '23  25  24    '$ Calcium 8.9 - 10.3 mg/dL 7.9  8.6  8.5     Urinalysis    Component Value Date/Time   COLORURINE YELLOW 11/20/2022 0212   APPEARANCEUR CLOUDY (A) 11/20/2022 0212   LABSPEC 1.010 11/20/2022 0212   PHURINE 7.0 11/20/2022 0212   GLUCOSEU >=500 (A) 11/20/2022 0212   HGBUR SMALL (A) 11/20/2022 0212   BILIRUBINUR NEGATIVE 11/20/2022 0212   KETONESUR NEGATIVE 11/20/2022 0212   PROTEINUR 100 (A) 11/20/2022 0212   UROBILINOGEN 0.2 01/14/2015 0346  NITRITE NEGATIVE 11/20/2022 0212   LEUKOCYTESUR LARGE (A) 11/20/2022 0212     ROS:  Pertinent items noted in HPI and remainder of comprehensive ROS otherwise negative.  Physical Exam: Vitals:   11/20/22 0823 11/20/22 1115  BP:  (!) 176/55  Pulse:  79  Resp:  16  Temp: 98.6 F (37 C)   SpO2:  100%     General: adult male in bed in NAD  HEENT: NCAT Eyes: EOMI sclera anicteric Neck: supple trachea midline  Heart: S1S2 no rub Lungs: clear and unlabored on room air  Abdomen: soft/nt/obese habitus  Extremities: bilateral BKA and the left BKA is swollen with erythema  Skin: erythema left residual limb as above - marked  Neuro: alert and oriented x 3 provides hx and follows commands Psych normal mood and affect  Access LUE AVF bruit and thrill   Outpatient HD orders:  [place]  TTS 4 hours and 15 minutes BF 450 DF 800  EDW 109 kg (last outpatient post weight on 11/17/22 of 110.8 kg and got to 113.7 kg the two treatments prior to that) 2K / 2 Ca AVF  Meds: hectorol 1 mcg every treatment; mircera 30 mcg every 4 weeks Hb 10.6 last check - mircera hasn't been given outpatient since at least Dec and was ordered to start 3/1)  Assessment/Plan:  # Abscess left residual limb  - abx per primary team - on vanc and cefepime  - ortho has been consulted and recommends just abx for now  # ESRD - HD today per TTS schedule as staffing permits - would change from regular diet to renal DM diet   # HTN  - optimize volume with HD  - noted  midodrine appears pre-HD per prior regimen.  Last charted BP 170's.  Defer for now but may need to give with HD  # Heart failure with preserved EF  - optimize volume status with HD    # Anemia CKD  - start aranesp 40 mcg weekly while here   # Secondary hyperparathyroidism renal  - Continue hectorol   Disposition - just admitted to [facility] 11/20/2022, 1:39 PM

## 2022-11-20 NOTE — ED Provider Notes (Addendum)
Received patient as signout awaiting CT scan.  Patient treated for cellulitis of his left stump, has had previous BKA.  Patient was febrile, treated with broad-spectrum antibiotics.  CT has returned and there is a fluid collection as well as edema and signs of cellulitis.       Discussed with Annie Main, PA-C who is on-call for the patient's orthopedic surgeon, [name].  Patient should undergo MRI to evaluate for osteomyelitis.  Will arrange for [name] to see the patient in the morning.  Patient will be admitted to hospitalist service.   Orpah Greek, MD 11/20/22 0153    Orpah Greek, MD 11/20/22 [phone]

## 2022-11-20 NOTE — Progress Notes (Signed)
Pharmacy Antibiotic Note  [name] is a 67 y.o. male with ESRD on HD TTSat admitted on 11/19/2022 with cellulitis.  Pharmacy has been consulted for Vancomycin dosing.  Plan: Vancomycin 1000 mg IV after each HD    Temp (24hrs), Avg:100.8 F (38.2 C), Min:100.1 F (37.8 C), Max:101.5 F (38.6 C)  Recent Labs  Lab 11/19/22 2058  WBC 10.6*  CREATININE 6.90*  LATICACIDVEN 1.7    CrCl cannot be calculated (Unknown ideal weight.).    Allergies  Allergen Reactions   Contrast Media [Iodinated Contrast Media] Shortness Of Breath and Other (See Comments)    Difficulty breathing and altered mental status     Ivp Dye [Iodinated Contrast Media] Anaphylaxis, Shortness Of Breath and Other (See Comments)    Breathing problems, altered mental state    Adhesive [Tape] Rash and Other (See Comments)    Rash after 1 day of use Do not leave on longer then 3 days with out be changed   Latex Rash and Other (See Comments)    A severe rash appears after the first 24 hours of being placed    Caryl Pina 11/20/2022 3:15 AM

## 2022-11-20 NOTE — Progress Notes (Signed)
Post Hemodialysis Tx   11/20/22 1910  Vitals  Temp 98.7 F (37.1 C)  Pulse Rate 87  Resp (!) 22  BP 112/61  SpO2 97 %  O2 Device Room Air  Oxygen Therapy  Patient Activity (if Appropriate) In bed  Pulse Oximetry Type Continuous  Post Treatment  Dialyzer Clearance Lightly streaked  Duration of HD Treatment -hour(s) 3.5 hour(s)  Liters Processed 77.6  Fluid Removed (mL) 3500 mL  Tolerated HD Treatment Yes  Post-Hemodialysis Comments Tx completed and tolerated well, no ssues to report  AVG/AVF Arterial Site Held (minutes) 7 minutes  AVG/AVF Venous Site Held (minutes) 5 minutes

## 2022-11-20 NOTE — Progress Notes (Signed)
Patient was seen and examined.  Still in the emergency room.  In brief, 67 year old gentleman with history of ESRD on hemodialysis, bilateral below-knee amputation, coronary artery disease, severe peripheral arterial disease status post" as above presented from short-term rehab due to left leg pain and subjective fever.  Left leg stump was erythematous and edematous.  Tmax 101.5 in the ER.  Due to concern for cellulitis cultures were drawn and started on empiric antibiotics.  CT scan showed fluid collection.  MRI consistent with 6 x 4 cm abscess wrapping around the amputation site of the tPA.  Diffuse cellulitis and mild fasciitis.  Left leg below-knee amputation stump infection: Seen by [name].  Draining hematoma left transtibial amputation with cellulitis and swelling.  Currently recommending conservative management.  Blood cultures pending.  Will send local cultures. Continue antibiotics with vancomycin and cefepime. He may need revision surgery if no adequate improvement. ESRD patient on hemodialysis, TTS schedule.  Nephrology notified for dialysis need.   Total time spent: 35 minutes.  Same-day admit.  No charge visit.

## 2022-11-20 NOTE — ED Notes (Signed)
Patient transported to dialysis

## 2022-11-20 NOTE — H&P (Addendum)
History and Physical  [name] [id number] DOB: 01/07/1956 DOA: 11/19/2022  Referring physician: Dr. Betsey Holiday, EDP  PCP: [name], PA-C  Outpatient Specialists: Ortho Patient coming from: Home  Chief Complaint: Left leg pain  HPI: [name] is a 67 y.o. male with medical history significant for HFpEF 56 to 60%, hypertension, ESRD on HD, bilateral BKA, coronary artery disease, history of osteomyelitis, PAD status post amputation, who presented to [facility] ED from home due to left leg pain.  Associated with subjective fevers.  In the ED, left lower extremity is erythematous, edematous warm to the touch and tender on palpation.  Febrile with Tmax 101.5 in the ED.  Due to concern for cellulitis, cultures were obtained, and he was started on empiric IV antibiotics.  CT scan of left lower extremity showed fluid collection.  EDP discussed the case with orthopedic surgery who recommended MRI left lower extremity to rule out osteomyelitis.  They will see in consultation in the morning.  Admitted by [facility], hospitalist service.  ED Course: Tmax 101.5.  BP 101/56, pulse 65, respiratory 16, O2 saturation 93% on room air.  Lab studies remarkable for serum sodium 134, glucose 313, creatinine 6.90, anion gap 17, albumin 2.8, GFR 8.  Hemoglobin 11.2, WBC 10.6.  Review of Systems: Review of systems as noted in the HPI. All other systems reviewed and are negative.   Past Medical History:  Diagnosis Date   Carotid artery occlusion 11/10/10   LEFT CAROTID ENDARTERECTOMY   Chronic kidney disease    Complication of anesthesia    BP WENT UP AT [facility] "   COPD (chronic obstructive pulmonary disease) ([place])    pt denies this dx as of 06/01/20 - no inhaler    Diabetes mellitus without complication ([place])    Diverticulitis    Diverticulosis of colon (without mention of hemorrhage)    DJD (degenerative joint disease)    knees/hands/feet/back/neck   Fatty liver    Full dentures    GERD (gastroesophageal  reflux disease)    H/O hiatal hernia    History of blood transfusion    with a past surical procedure per patient 06/01/20   Hyperlipidemia    Hypertension    Neuromuscular disorder ([place])    peripheral neuropathy   Non-pressure chronic ulcer of other part of left foot limited to breakdown of skin ([place]) 11/12/2016   Osteomyelitis (HCC)    left 5th metatarsal   PAD (peripheral artery disease) ([place])    Distal aortogram June 2012. Atherectomy left popliteal artery July 2012.    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   Unstable angina ([place]) 09/16/2018   Wears glasses    Past Surgical History:  Procedure Laterality Date   ABDOMINAL AORTOGRAM W/LOWER EXTREMITY N/A 06/23/2021   Procedure: ABDOMINAL AORTOGRAM W/LOWER EXTREMITY;  Surgeon: Nigel Mormon, MD;  Location: [place] [facility];  Service: Cardiovascular;  Laterality: N/A;   AMPUTATION  11/05/2011   Procedure: AMPUTATION RAY;  Surgeon: Wylene Simmer, MD;  Location: [place];  Service: Orthopedics;  Laterality: Right;  Amputation of Right 4&5th Toes   AMPUTATION Left 11/26/2012   Procedure: AMPUTATION RAY;  Surgeon: Wylene Simmer, MD;  Location: [place];  Service: Orthopedics;  Laterality: Left;  fourth ray amputation   AMPUTATION Right 08/27/2014   Procedure: Transmetatarsal Amputation;  Surgeon: Beverely Low  Fernanda Drum, MD;  Location: [place];  Service: Orthopedics;  Laterality: Right;   AMPUTATION Right 01/14/2015   Procedure: AMPUTATION BELOW KNEE;  Surgeon: Newt Minion, MD;  Location: [place];  Service: Orthopedics;  Laterality: Right;   AMPUTATION Left 10/21/2015   Procedure: Left Foot 5th Ray Amputation;  Surgeon: Newt Minion, MD;  Location: [place];  Service: Orthopedics;  Laterality: Left;   AMPUTATION Left 07/04/2022   Procedure: LEFT BELOW KNEE AMPUTATION;  Surgeon: Newt Minion, MD;  Location: [place];  Service: Orthopedics;   Laterality: Left;   ANTERIOR FUSION CERVICAL SPINE  02/06/06   C4-5, C5-6, C6-7; SURGEON DR. MAX COHEN   AV FISTULA PLACEMENT Left 06/02/2020   Procedure: ARTERIOVENOUS (AV) FISTULA CREATION LEFT;  Surgeon: Waynetta Sandy, MD;  Location: [place];  Service: Vascular;  Laterality: Left;   BACK SURGERY     x 3   [place] Left 07/21/2020   Procedure: LEFT UPPER ARM ATERIOVENOUS SUPERFISTULALIZATION;  Surgeon: Waynetta Sandy, MD;  Location: [place];  Service: Vascular;  Laterality: Left;   BELOW KNEE LEG AMPUTATION Right    CARDIAC CATHETERIZATION  10/31/04   2009   CAROTID ENDARTERECTOMY  11/10/10   CAROTID ENDARTERECTOMY Left 11/10/2010   Subtotal occlusion of left internal carotid artery with left hemispheric transient ischemic attacks.   CAROTID STENT     CARPAL TUNNEL RELEASE Right 10/21/2013   Procedure: RIGHT CARPAL TUNNEL RELEASE;  Surgeon: Wynonia Sours, MD;  Location: [place];  Service: Orthopedics;  Laterality: Right;   CHOLECYSTECTOMY     COLON SURGERY     COLONOSCOPY     COLOSTOMY REVERSAL  05/21/2018   ileostomy reversal   CYSTOSCOPY WITH STENT PLACEMENT Bilateral 01/13/2018   Procedure: CYSTOSCOPY WITH BILATERAL URETERAL CATHETER PLACEMENT;  Surgeon: Ardis Hughs, MD;  Location: [facility];  Service: Urology;  Laterality: Bilateral;   ESOPHAGEAL MANOMETRY Bilateral 07/19/2014   Procedure: ESOPHAGEAL MANOMETRY (EM);  Surgeon: Jerene Bears, MD;  [facility];  Service: Gastroenterology;  Laterality: Bilateral;   EYE SURGERY Bilateral 2020   cataract   FEMORAL ARTERY STENT     x6   FINGER SURGERY     FOOT SURGERY  04/25/2016    EXCISION BASE 5TH METATARSAL AND PARTIAL CUBOID LEFT FOOT   HERNIA REPAIR     LEFT INGUINAL AND UMBILICAL REPAIRS   HERNIA REPAIR     I & D EXTREMITY Left 04/25/2016   Procedure: EXCISION BASE 5TH METATARSAL AND PARTIAL CUBOID LEFT FOOT;  Surgeon: Newt Minion, MD;  Location: [place];  Service:  Orthopedics;  Laterality: Left;   ILEOSTOMY  01/13/2018   Procedure: ILEOSTOMY;  Surgeon: Clovis Riley, MD;  Location[facility];  Service: General;;   ILEOSTOMY CLOSURE N/A 05/21/2018   Procedure: ILEOSTOMY REVERSAL ERAS PATHWAY;  Surgeon: Clovis Riley, MD;  Location: [place];  Service: General;  Laterality: N/A;   IR RADIOLOGIST EVAL & MGMT  11/19/2017   IR RADIOLOGIST EVAL & MGMT  12/03/2017   IR RADIOLOGIST EVAL & MGMT  12/18/2017   JOINT REPLACEMENT Right 2001   Total knee   LAMINECTOMY     X 3 LUMBAR AND X 2 CERVICAL SPINE OPERATIONS   LAPAROSCOPIC CHOLECYSTECTOMY W/ CHOLANGIOGRAPHY  11/09/04   SURGEON DR. [place] CATH AND CORONARY ANGIOGRAPHY N/A 09/16/2018   Procedure: LEFT HEART CATH AND CORONARY ANGIOGRAPHY;  Surgeon: Nigel Mormon, MD;  Location: [place] [facility];  Service: Cardiovascular;  Laterality: N/A;   LEFT HEART CATHETERIZATION WITH CORONARY ANGIOGRAM N/A 10/29/2014   Procedure: LEFT HEART CATHETERIZATION WITH CORONARY ANGIOGRAM;  Surgeon: Laverda Page, MD;  Location[facility];  Service: Cardiovascular;  Laterality: N/A;   LIGATION OF COMPETING BRANCHES OF ARTERIOVENOUS FISTULA Left 07/21/2020   Procedure: LIGATION OF COMPETING BRANCHES OF LEFT UPPER ARM ARTERIOVENOUS FISTULA;  Surgeon: Waynetta Sandy, MD;  Location: [place];  Service: Vascular;  Laterality: Left;   LOWER EXTREMITY ANGIOGRAM N/A 03/19/2012   Procedure: LOWER EXTREMITY ANGIOGRAM;  Surgeon: Burnell Blanks, MD;  Location: [facility] CATH LAB;  Service: Cardiovascular;  Laterality: N/A;   LOWER EXTREMITY ANGIOGRAPHY N/A 06/20/2021   Procedure: LOWER EXTREMITY ANGIOGRAPHY;  Surgeon: Nigel Mormon, MD;  Location: [place] [facility];  Service: Cardiovascular;  Laterality: N/A;   NECK SURGERY     PARTIAL COLECTOMY N/A 01/13/2018   Procedure: LAPAROSCOPIC ASSISTED   SIGMOID COLECTOMY ILEOSTOMY;  Surgeon: Clovis Riley, MD;  [facility];  Service: General;   Laterality: N/A;   PENILE PROSTHESIS IMPLANT  08/14/05   INFRAPUBIC INSERTION OF INFLATABLE PENILE PROSTHESIS; SURGEON DR. Amalia Hailey   PENILE PROSTHESIS IMPLANT     PERCUTANEOUS CORONARY STENT INTERVENTION (PCI-S) Right 10/29/2014   Procedure: PERCUTANEOUS CORONARY STENT INTERVENTION (PCI-S);  Surgeon: Laverda Page, MD;  Location: [facility] C[facility] LAB;  Service: Cardiovascular;  Laterality: Right;   PERIPHERAL VASCULAR INTERVENTION Left 06/23/2021   Procedure: PERIPHERAL VASCULAR INTERVENTION;  Surgeon: Nigel Mormon, MD;  Location: [place] [facility];  Service: Cardiovascular;  Laterality: Left;   REMOVAL OF PENILE PROSTHESIS N/A 06/14/2021   Procedure: Removal of THREE piece inflatable penile prosthesis;  Surgeon: Lucas Mallow, MD;  Location: [place];  Service: Urology;  Laterality: N/A;   SHOULDER ARTHROSCOPY     SPINE SURGERY     TOE AMPUTATION Left    TONSILLECTOMY     TOTAL KNEE ARTHROPLASTY  07/2002   RIGHT KNEE ; SURGEON  DR. [name] ALSO HAD ARTHROSCOPIC RIGHT KNEE IN  10/2001   TOTAL KNEE ARTHROPLASTY     ULNAR NERVE TRANSPOSITION Right 10/21/2013   Procedure: RIGHT ELBOW  ULNAR NERVE DECOMPRESSION;  Surgeon: Wynonia Sours, MD;  Location: [place];  Service: Orthopedics;  Laterality: Right;    Social History:  reports that he quit smoking about 11 years ago. His smoking use included cigarettes. He has a 70.00 pack-year smoking history. He has never used smokeless tobacco. He reports that he does not currently use alcohol. He reports that he does not use drugs.   Allergies  Allergen Reactions   Contrast Media [Iodinated Contrast Media] Shortness Of Breath and Other (See Comments)    Difficulty breathing and altered mental status     Ivp Dye [Iodinated Contrast Media] Anaphylaxis, Shortness Of Breath and Other (See Comments)    Breathing problems, altered mental state    Adhesive [Tape] Rash and Other (See Comments)    Rash after 1 day of use Do not leave on  longer then 3 days with out be changed   Latex Rash and Other (See Comments)    A severe rash appears after the first 24 hours of being placed    Family History  Problem Relation Age of Onset   Heart disease Father        Before age 42-  CAD, BPG   Diabetes Father        Amputation  Cancer Father        PROSTATE   Hyperlipidemia Father    Hypertension Father    Heart attack Father        Triple BPG   Varicose Veins Father    Cancer Sister        Breast   Hyperlipidemia Sister    Hypertension Sister    Heart attack Brother    Colon cancer Brother    Diabetes Brother    Heart disease Brother 33       A-Fib. Before age 21   Hyperlipidemia Brother    Hypertension Brother    Hypertension Son    Arthritis Other        GRANDMOTHER   Hypertension Other        OTHER FAMILY MEMBERS      Prior to Admission medications   Medication Sig Start Date End Date Taking? Authorizing Provider  amiodarone (PACERONE) 200 MG tablet Take 1 tablet (200 mg total) by mouth daily. 10/29/22   Adrian Prows, MD  amLODipine (NORVASC) 5 MG tablet Take 5 mg by mouth daily as needed for dizziness. Take one tablet for BP > 160 mm Hg 01/30/21   [provider]  apixaban (ELIQUIS) 5 MG TABS tablet Take 1 tablet (5 mg total) by mouth 2 (two) times daily. 01/14/22 10/29/22  Mercy Riding, MD  atorvastatin (LIPITOR) 40 MG tablet Take 40 mg by mouth at bedtime.     [provider]  B Complex-C-Zn-Folic Acid (DIALYVITE/ZINC) TABS Take 1 tablet by mouth Every Tuesday,Thursday,and Saturday with dialysis. 10/04/21   [provider]  famotidine (PEPCID) 20 MG tablet Take 20 mg by mouth every other day.    [provider]  fentaNYL (DURAGESIC) 50 MCG/HR Place 1 patch onto the skin every 3 (three) days. 07/10/22   Newt Minion, MD  folic acid (FOLVITE) 1 MG tablet Take 1 mg by mouth daily. 09/18/21   [provider]  Glucosamine HCl (GLUCOSAMINE PO) Take 1 tablet by mouth 2 (two)  times daily.    [provider]  insulin glargine (LANTUS) 100 UNIT/ML injection Inject 0.2 mLs (20 Units total) into the skin at bedtime. Patient taking differently: Inject 30 Units into the skin at bedtime. 06/28/22   [name], MD  insulin lispro (HUMALOG) 100 UNIT/ML injection Inject 4-13 Units into the skin 3 (three) times daily before meals. Only with meals if needed    [provider]  ketoconazole (NIZORAL) 2 % cream Apply 1 application topically 2 (two) times daily as needed for irritation.  03/07/18   [provider]  lidocaine-prilocaine (EMLA) cream Apply 1 application topically See admin instructions. Prior to Dialysis days Tuesday,Thursday and saturday 06/06/21   [provider]  loratadine (CLARITIN) 10 MG tablet Take 10 mg by mouth daily.    [provider]  metoCLOPramide (REGLAN) 5 MG tablet Take 1 tablet (5 mg total) by mouth 4 (four) times daily -  before meals and at bedtime. Patient taking differently: Take 5 mg by mouth 2 (two) times daily. 01/14/22 10/29/22  Mercy Riding, MD  midodrine (PROAMATINE) 10 MG tablet Take 1 tablet (10 mg total) by mouth every other day. One hour before dialysis Patient taking differently: Take 10 mg by mouth Every Tuesday,Thursday,and Saturday with dialysis. One hour before dialysis 09/07/21   Barb Merino, MD  NEEDLE, REUSABLE, 22 G 22G X 1-1/2" MISC 1 Units by Does not apply route as directed. For B12  IM inj 02/15/18   Elgergawy, Silver Huguenin, MD  nitroGLYCERIN (NITROSTAT) 0.4 MG SL tablet Place 0.4 mg under the tongue every 5 (five) minutes as needed for chest pain. 01/06/22   [provider]  NOVOLOG FLEXPEN 100 UNIT/ML FlexPen Inject 4 Units into the skin 3 (three) times daily. 06/28/22   [name], MD  omega-3 acid ethyl esters (LOVAZA) 1 g capsule Take 2 g by mouth 2 (two) times daily. Mini    [provider]  ondansetron (ZOFRAN-ODT) 4 MG disintegrating tablet  Take 1 tablet by mouth 4 (four) times daily as needed for nausea/vomiting. 05/28/22   [provider]  oxycodone-acetaminophen (LYNOX) 5-300 MG tablet Take 1 tablet by mouth every 4 (four) hours as needed for pain.    [provider]  oxyCODONE-acetaminophen (PERCOCET) 7.5-325 MG tablet Take 1 tablet by mouth every 4 (four) hours as needed for severe pain. 07/10/22 07/10/23  Newt Minion, MD  pantoprazole (PROTONIX) 40 MG tablet Take 1 tablet (40 mg total) by mouth 2 (two) times daily for 30 days, THEN 1 tablet (40 mg total) daily. Patient taking differently:  1 tablet (40 mg total) daily. 01/14/22 10/29/22  Mercy Riding, MD  Polyvinyl Alcohol-Povidone PF 1.4-0.6 % SOLN Place 1 drop into both eyes 2 (two) times daily as needed (dry eyes).     [provider]  pregabalin (LYRICA) 100 MG capsule Take 100 mg by mouth 2 (two) times daily. 09/18/21   [provider]  rOPINIRole (REQUIP) 2 MG tablet Take 2 mg by mouth at bedtime.    [provider]  Syringe/Needle, Disp, (SYRINGE 3CC/22GX1-1/2") 22G X 1-1/2" 3 ML MISC 1 Syringe by Does not apply route as directed. For b12 IM inj 02/15/18   Elgergawy, Silver Huguenin, MD  tamsulosin (FLOMAX) 0.4 MG CAPS capsule Take 0.4 mg by mouth daily.    [provider]    Physical Exam: BP (!) 120/59   Pulse 96   Temp 100.1 F (37.8 C) (Oral)   Resp (!) 21   SpO2 95%   General: 67 y.o. year-old male well developed well nourished in no acute distress.  Somnolent but easily arousable to voices. Cardiovascular: Regular rate and rhythm with no rubs or gallops.  No thyromegaly or JVD noted.  No lower extremity edema. 2/4 pulses in all 4 extremities. Respiratory: Clear to auscultation with no wheezes or rales. Good inspiratory effort. Abdomen: Soft nontender nondistended with normal bowel sounds x4 quadrants. Muskuloskeletal: No cyanosis, clubbing or edema noted bilaterally Neuro: CN II-XII intact, strength, sensation,  reflexes Skin: Left thump erythematous, edematous, warm and tender to the touch. Psychiatry: Unable to assess mood due to somnolence.          Labs on Admission:  Basic Metabolic Panel: Recent Labs  Lab 11/19/22 2058  NA 134*  K 3.8  CL 92*  CO2 25  GLUCOSE 313*  BUN 42*  CREATININE 6.90*  CALCIUM 8.6*   Liver Function Tests: Recent Labs  Lab 11/19/22 2058  AST 20  ALT 13  ALKPHOS 117  BILITOT 0.6  PROT 6.7  ALBUMIN 2.8*   No results for input(s): "LIPASE", "AMYLASE" in the last 168 hours. No results for input(s): "AMMONIA" in the last 168 hours. CBC: Recent Labs  Lab 11/19/22 2058  WBC 10.6*  NEUTROABS 8.3*  HGB 11.2*  HCT 33.6*  MCV 91.3  PLT 202   Cardiac Enzymes: No results for input(s): "CKTOTAL", "CKMB", "CKMBINDEX", "TROPONINI" in the  last 168 hours.  BNP (last 3 results) No results for input(s): "BNP" in the last 8760 hours.  ProBNP (last 3 results) No results for input(s): "PROBNP" in the last 8760 hours.  CBG: No results for input(s): "GLUCAP" in the last 168 hours.  Radiological Exams on Admission: CT EXTREMITY LOWER LEFT WO CONTRAST  Result Date: 11/20/2022 CLINICAL DATA:  Leg pain with redness and swelling EXAM: CT OF THE LOWER LEFT EXTREMITY WITHOUT CONTRAST TECHNIQUE: Multidetector CT imaging of the lower left extremity was performed according to the standard protocol. RADIATION DOSE REDUCTION: This exam was performed according to the departmental dose-optimization program which includes automated exposure control, adjustment of the mA and/or kV according to patient size and/or use of iterative reconstruction technique. COMPARISON:  CT 06/23/2022 FINDINGS: Bones/Joint/Cartilage No significant left hip effusion. No acute fracture or malalignment. No frank osseous destructive change. Small suprapatellar knee effusion. Status post below the knee amputation with relatively smooth cut margins. Moderate patellofemoral degenerative changes. At least  mild medial and lateral tibiofemoral degenerative change. Ligaments Suboptimally assessed by CT. Muscles and Tendons Negative for intramuscular fluid collection.  Mild fatty atrophy. Soft tissues Prominent left inguinal lymph nodes measuring up to 11 mm. Moderate severe subcutaneous fluid and edema worst at the level of distal thigh, knee and proximal lower leg. Suspicion of focal fluid collection within the distal stump, this measures 5.9 by 3.9 by 2.3 cm and is distal to the cut margin of the tibia. No internal gas bubbles. IMPRESSION: 1. Status post left below the knee amputation. Moderate to severe subcutaneous fluid and edema worst at the level of the distal thigh, knee and proximal lower leg. Suspicion of focal fluid collection within the distal stump, this measures 5.9 x 3.9 x 2.3 cm and is distal to the cut margin of the tibia. No internal gas bubbles but concern is raised for soft tissue abscess. No convincing osseous destructive change at the cut margins of the tibia and fibula. Further assessment with MRI may be considered. 2. Small suprapatellar knee effusion. Electronically Signed   By: Donavan Foil M.D.   On: 11/20/2022 00:49   DG Chest Portable 1 View  Result Date: 11/19/2022 CLINICAL DATA:  Sepsis EXAM: PORTABLE CHEST 1 VIEW COMPARISON:  Chest x-ray dated July 03, 2022 FINDINGS: Cardiac and mediastinal contours are unchanged. Mild linear left perihilar opacities, likely due to atelectasis. Lungs are otherwise clear. No evidence of pleural effusion or pneumothorax. IMPRESSION: Mild linear left perihilar opacities, likely due to atelectasis. Electronically Signed   By: Yetta Glassman M.D.   On: 11/19/2022 21:51    EKG: I independently viewed the EKG done and my findings are as followed:    Assessment/Plan Present on Admission:  Cellulitis  Active Problems:   Cellulitis  Left lower extremity cellulitis, concern for osteomyelitis Fluid collection seen on CT scan MRI left lower  extremity is pending to rule out osteomyelitis Orthopedic surgery consulted, will see in consultation in the morning Continue empiric IV antibiotics and follow peripheral blood cultures Monitor fever curve and WBC  Acute metabolic encephalopathy Somnolent but arousable to voices Avoid sedating agents. Follow-up VBG to rule out hypercapnia  Peripheral artery disease status post bilateral below the knee amputation Chronic atrial fibrillation on Eliquis DOAC and antiplatelets on hold until seen by orthopedic surgery  ESRD on HD Consult nephrology to resume hemodialysis while inpatient Volume status and electrolytes managed with hemodialysis.  Type 2 diabetes with hyperglycemia Obtain hemoglobin A1c Start insulin sliding scale every 4  hours while NPO.  Anemia of chronic disease No overt bleeding reported Monitor H&H  Hyperlipidemia Resume home Lipitor  GERD Resume home H2 blocker  BPH Resume home Flomax    DVT prophylaxis: Subcu heparin 3 times daily, unclear if he will have a procedure done within the next 24 hours.  Code Status: Full code.  Family Communication: None at bedside.  Disposition Plan: Admitted to telemetry surgical unit  Consults called: Orthopedic surgery  Admission status: Inpatient status.   Status is: Inpatient The patient requires at least 2 midnights for further evaluation and treatment of present condition.   Kayleen Memos MD [facility] Pager [phone]  If 7PM-7AM, please contact night-coverage [email or web address] Password [facility]  11/20/2022, 3:08 AM

## 2022-11-21 ENCOUNTER — Ambulatory Visit: Payer: Medicare HMO | Admitting: Family

## 2022-11-21 DIAGNOSIS — L02416 Cutaneous abscess of left lower limb: Secondary | ICD-10-CM

## 2022-11-21 DIAGNOSIS — L03116 Cellulitis of left lower limb: Secondary | ICD-10-CM | POA: Diagnosis not present

## 2022-11-21 LAB — RENAL FUNCTION PANEL
Albumin: 2.3 g/dL — ABNORMAL LOW (ref 3.5–5.0)
Anion gap: 13 (ref 5–15)
BUN: 33 mg/dL — ABNORMAL HIGH (ref 8–23)
CO2: 27 mmol/L (ref 22–32)
Calcium: 8.6 mg/dL — ABNORMAL LOW (ref 8.9–10.3)
Chloride: 96 mmol/L — ABNORMAL LOW (ref 98–111)
Creatinine, Ser: 5.17 mg/dL — ABNORMAL HIGH (ref 0.61–1.24)
GFR, Estimated: 11 mL/min — ABNORMAL LOW (ref >60)
Glucose, Bld: 216 mg/dL — ABNORMAL HIGH (ref 70–99)
Phosphorus: 4.1 mg/dL (ref 2.5–4.6)
Potassium: 3.9 mmol/L (ref 3.5–5.1)
Sodium: 136 mmol/L (ref 135–145)

## 2022-11-21 LAB — GLUCOSE, CAPILLARY
Glucose-Capillary: 200 mg/dL — ABNORMAL HIGH (ref 70–99)
Glucose-Capillary: 220 mg/dL — ABNORMAL HIGH (ref 70–99)
Glucose-Capillary: 154 mg/dL — ABNORMAL HIGH (ref 70–99)
Glucose-Capillary: 175 mg/dL — ABNORMAL HIGH (ref 70–99)

## 2022-11-21 LAB — CBC
HCT: 31.6 % — ABNORMAL LOW (ref 39.0–52.0)
Hemoglobin: 10.8 g/dL — ABNORMAL LOW (ref 13.0–17.0)
MCH: 30.2 pg (ref 26.0–34.0)
MCHC: 34.2 g/dL (ref 30.0–36.0)
MCV: 88.3 fL (ref 80.0–100.0)
Platelets: 194 10*3/uL (ref 150–400)
RBC: 3.58 MIL/uL — ABNORMAL LOW (ref 4.22–5.81)
RDW: 12.5 % (ref 11.5–15.5)
WBC: 7.5 10*3/uL (ref 4.0–10.5)
nRBC: 0 % (ref 0.0–0.2)

## 2022-11-21 LAB — HEMOGLOBIN A1C
Hgb A1c MFr Bld: 7 % — ABNORMAL HIGH (ref 4.8–5.6)
Mean Plasma Glucose: 154 mg/dL

## 2022-11-21 MED ORDER — MIDODRINE HCL 5 MG PO TABS: 10.0000 mg | ORAL_TABLET | ORAL | Status: DC

## 2022-11-21 MED ORDER — LIDOCAINE 5 % EX PTCH
1.0000 | MEDICATED_PATCH | Freq: Every day | CUTANEOUS | Status: DC
  Administered 2022-11-21: 1 via TRANSDERMAL
  Filled 2022-11-21 (×2): qty 1

## 2022-11-21 MED ORDER — FENTANYL 50 MCG/HR TD PT72
1.0000 | MEDICATED_PATCH | TRANSDERMAL | Status: DC
  Administered 2022-11-21: 1 via TRANSDERMAL
  Filled 2022-11-21: qty 1

## 2022-11-21 MED ORDER — OXYCODONE HCL 5 MG PO TABS
5.0000 mg | ORAL_TABLET | ORAL | Status: DC | PRN
  Administered 2022-11-21 – 2022-11-23 (×9): 5 mg via ORAL
  Filled 2022-11-21 (×9): qty 1

## 2022-11-21 MED ORDER — CHLORHEXIDINE GLUCONATE CLOTH 2 % EX PADS
6.0000 | MEDICATED_PAD | Freq: Every day | CUTANEOUS | Status: DC
  Administered 2022-11-22 – 2022-11-23 (×2): 6 via TOPICAL

## 2022-11-21 MED ORDER — ORAL CARE MOUTH RINSE: 15.0000 mL | OROMUCOSAL | Status: DC | PRN

## 2022-11-21 NOTE — Progress Notes (Signed)
Pt receives out-pt HD at [facility] on TTS. Will assist as needed.   Buel Molder Renal Navigator [phone] 

## 2022-11-21 NOTE — Progress Notes (Signed)
PROGRESS NOTE    [name]  [id number] DOB: 04-09-1956 DOA: 11/19/2022 PCP: [name], PA-C    Brief Narrative:  67 year old gentleman with history of ESRD on hemodialysis, bilateral below-knee amputation, coronary artery disease, severe peripheral arterial disease status post bilateral BKA presented from short-term rehab due to left leg pain and subjective fever. Left leg stump was erythematous and edematous. Tmax 101.5 in the ER. Due to concern for cellulitis cultures were drawn and started on empiric antibiotics. CT scan showed fluid collection. MRI consistent with 6 x 4 cm abscess wrapping around the amputation site of the tPA. Diffuse cellulitis and mild fasciitis.    Assessment & Plan:   Left leg below-knee amputation stump infection: Hematoma versus postop seroma.  Cellulitis. Seen by [name].  Draining hematoma left transtibial amputation with cellulitis and swelling.  Currently recommending conservative management.  Blood cultures pending.  Local cultures pending. Continue antibiotics with vancomycin and cefepime. Surgery not anticipating revision surgery. Adequate pain management. Work with PT OT.  Will refer back to a skilled nursing rehab.  ESRD patient on hemodialysis, TTS schedule.  Nephrology notified for dialysis need.  On the schedule.  Severe peripheral arterial disease, bilateral below-knee amputation, chronic atrial fibrillation on anticoagulation with Eliquis: Eliquis on hold.  Will continue to hold until confirmed determination that patient does not need any surgery.  Type 2 diabetes with hyperglycemia: On insulin.  Continue.  Stable.  DVT prophylaxis: heparin injection 5,000 Units Start: 11/20/22 1400   Code Status: Full code Family Communication: None at the bedside Disposition Plan: Status is: Inpatient Remains inpatient appropriate because: IV antibiotics     Consultants:  Orthopedics, [name]  Procedures:  None  Antimicrobials:   Vancomycin and cefepime 3/11---   Subjective: Patient seen and examined.  Pain relieved with oxycodone.  His left leg wound was squeezed by surgeons with some improvement.  He is eager to work with PT OT as he needs to go back to SNF.  Remains afebrile.  Received hemodialysis yesterday.  Objective: Vitals:   11/21/22 0028 11/21/22 0353 11/21/22 0755 11/21/22 1411  BP: (!) 156/69 (!) 166/62 (!) 169/68 (!) 161/65  Pulse: 71 78 79 69  Resp: '20 18  16  '$ Temp:  99.4 F (37.4 C) 98.5 F (36.9 C) 98.4 F (36.9 C)  TempSrc:  Oral Oral Oral  SpO2: 97% 96% 100% 98%    Intake/Output Summary (Last 24 hours) at 11/21/2022 1542 Last data filed at 11/21/2022 1200 Gross per 24 hour  Intake 760 ml  Output 3825 ml  Net -3065 ml   Filed Weights    Examination:  General exam: Appears calm and comfortable.  Alert oriented.  On room air. Respiratory system: No added sounds. Cardiovascular system: S1 & S2 heard, RRR.  Gastrointestinal system: Soft.  Nontender.  Bowel sound present. Central nervous system: Alert and oriented. No focal neurological deficits. Extremities: Symmetric 5 x 5 power. Right below-knee amputation stump clean and dry. Left below-knee amputation stump on compression bandage, erythematous skin, draining hematoma. Left upper extremity AV fistula with thrill.   Data Reviewed: I have personally reviewed following labs and imaging studies  CBC: Recent Labs  Lab 11/19/22 2058 11/20/22 0437 11/21/22 1040  WBC 10.6* 10.1 7.5  NEUTROABS 8.3*  --   --   HGB 11.2* 9.5* 10.8*  HCT 33.6* 29.5* 31.6*  MCV 91.3 92.2 88.3  PLT 202 169 [id number]   Basic Metabolic Panel: Recent Labs  Lab 11/19/22 2058 11/20/22 0437 11/21/22  1040  NA 134* 134* 136  K 3.8 3.8 3.9  CL 92* 96* 96*  CO2 '25 23 27  '$ GLUCOSE 313* 241* 216*  BUN 42* 49* 33*  CREATININE 6.90* 7.12* 5.17*  CALCIUM 8.6* 7.9* 8.6*  MG  --  1.8  --   PHOS  --  4.6 4.1   GFR: CrCl cannot be calculated (Unknown ideal  weight.). Liver Function Tests: Recent Labs  Lab 11/19/22 2058 11/20/22 0437 11/21/22 1040  AST 20  --   --   ALT 13  --   --   ALKPHOS 117  --   --   BILITOT 0.6  --   --   PROT 6.7  --   --   ALBUMIN 2.8* 2.2* 2.3*   No results for input(s): "LIPASE", "AMYLASE" in the last 168 hours. No results for input(s): "AMMONIA" in the last 168 hours. Coagulation Profile: No results for input(s): "INR", "PROTIME" in the last 168 hours. Cardiac Enzymes: No results for input(s): "CKTOTAL", "CKMB", "CKMBINDEX", "TROPONINI" in the last 168 hours. BNP (last 3 results) No results for input(s): "PROBNP" in the last 8760 hours. HbA1C: Recent Labs    11/20/22 0437  HGBA1C 7.0*   CBG: Recent Labs  Lab 11/20/22 0739 11/20/22 1131 11/20/22 2047 11/21/22 0756 11/21/22 1106  GLUCAP 185* 216* 160* 200* 220*   Lipid Profile: No results for input(s): "CHOL", "HDL", "LDLCALC", "TRIG", "CHOLHDL", "LDLDIRECT" in the last 72 hours. Thyroid Function Tests: No results for input(s): "TSH", "T4TOTAL", "FREET4", "T3FREE", "THYROIDAB" in the last 72 hours. Anemia Panel: No results for input(s): "VITAMINB12", "FOLATE", "FERRITIN", "TIBC", "IRON", "RETICCTPCT" in the last 72 hours. Sepsis Labs: Recent Labs  Lab 11/19/22 2058 11/20/22 0437  LATICACIDVEN 1.7 1.4    Recent Results (from the past 240 hour(s))  Culture, blood (Routine x 2)     Status: None (Preliminary result)   Collection Time: 11/19/22  9:03 PM   Specimen: BLOOD  Result Value Ref Range Status   Specimen Description BLOOD RIGHT ANTECUBITAL  Final   Special Requests   Final    BOTTLES DRAWN AEROBIC AND ANAEROBIC Blood Culture results may not be optimal due to an excessive volume of blood received in culture bottles   Culture   Final    NO GROWTH 2 DAYS Performed at [facility] [address] [place], [place] [address]    Report Status PENDING  Incomplete  Culture, blood (Routine x 2)     Status: None (Preliminary  result)   Collection Time: 11/20/22  9:14 PM   Specimen: BLOOD  Result Value Ref Range Status   Specimen Description BLOOD BLOOD RIGHT ARM  Final   Special Requests   Final    BOTTLES DRAWN AEROBIC AND ANAEROBIC Blood Culture results may not be optimal due to an inadequate volume of blood received in culture bottles   Culture   Final    NO GROWTH < 12 HOURS Performed at [facility], [place] [address] [place], [place] [address]    Report Status PENDING  Incomplete         Radiology Studies: MR TIBIA FIBULA LEFT WO CONTRAST  Result Date: 11/20/2022 CLINICAL DATA:  Pain and swelling involving the left lower extremity. History of below-knee amputation. EXAM: MRI OF LOWER LEFT EXTREMITY WITHOUT CONTRAST TECHNIQUE: Multiplanar, multisequence MR imaging of the left lower extremity was performed. No intravenous contrast was administered. COMPARISON:  CT scan, same date. FINDINGS: Diffuse subcutaneous soft tissue swelling/edema and  skin thickening consistent with cellulitis. There is also significant myofasciitis involving the below knee musculature. As demonstrated on the CT scan there is a large abscess wrapping around the amputation site of the tibia. This measures approximately 5.8 x 3.5 cm. This is adjacent to the amputation site and other low there is no gross changes of osteomyelitis in the tibia there is mild T2 signal abnormality suggesting early osteomyelitis. No findings for septic arthritis at the knee joint. IMPRESSION: 1. 5.8 x 3.5 cm abscess wrapping around the amputation site of the tibia. 2. Mild T2 signal abnormality in the tibia suggesting early osteomyelitis. 3. Diffuse cellulitis and myofasciitis. Electronically Signed   By: Marijo Sanes M.D.   On: 11/20/2022 07:19   CT EXTREMITY LOWER LEFT WO CONTRAST  Result Date: 11/20/2022 CLINICAL DATA:  Leg pain with redness and swelling EXAM: CT OF THE LOWER LEFT EXTREMITY WITHOUT CONTRAST TECHNIQUE: Multidetector CT imaging of the  lower left extremity was performed according to the standard protocol. RADIATION DOSE REDUCTION: This exam was performed according to the departmental dose-optimization program which includes automated exposure control, adjustment of the mA and/or kV according to patient size and/or use of iterative reconstruction technique. COMPARISON:  CT 06/23/2022 FINDINGS: Bones/Joint/Cartilage No significant left hip effusion. No acute fracture or malalignment. No frank osseous destructive change. Small suprapatellar knee effusion. Status post below the knee amputation with relatively smooth cut margins. Moderate patellofemoral degenerative changes. At least mild medial and lateral tibiofemoral degenerative change. Ligaments Suboptimally assessed by CT. Muscles and Tendons Negative for intramuscular fluid collection.  Mild fatty atrophy. Soft tissues Prominent left inguinal lymph nodes measuring up to 11 mm. Moderate severe subcutaneous fluid and edema worst at the level of distal thigh, knee and proximal lower leg. Suspicion of focal fluid collection within the distal stump, this measures 5.9 by 3.9 by 2.3 cm and is distal to the cut margin of the tibia. No internal gas bubbles. IMPRESSION: 1. Status post left below the knee amputation. Moderate to severe subcutaneous fluid and edema worst at the level of the distal thigh, knee and proximal lower leg. Suspicion of focal fluid collection within the distal stump, this measures 5.9 x 3.9 x 2.3 cm and is distal to the cut margin of the tibia. No internal gas bubbles but concern is raised for soft tissue abscess. No convincing osseous destructive change at the cut margins of the tibia and fibula. Further assessment with MRI may be considered. 2. Small suprapatellar knee effusion. Electronically Signed   By: Donavan Foil M.D.   On: 11/20/2022 00:49   DG Chest Portable 1 View  Result Date: 11/19/2022 CLINICAL DATA:  Sepsis EXAM: PORTABLE CHEST 1 VIEW COMPARISON:  Chest x-ray  dated July 03, 2022 FINDINGS: Cardiac and mediastinal contours are unchanged. Mild linear left perihilar opacities, likely due to atelectasis. Lungs are otherwise clear. No evidence of pleural effusion or pneumothorax. IMPRESSION: Mild linear left perihilar opacities, likely due to atelectasis. Electronically Signed   By: Yetta Glassman M.D.   On: 11/19/2022 21:51        Scheduled Meds:  atorvastatin  40 mg Oral QHS   Chlorhexidine Gluconate Cloth  6 each Topical Q0600   [START ON 11/22/2022] doxercalciferol  1 mcg Intravenous Q T,Th,Sa-HD   famotidine  20 mg Oral QODAY   heparin  5,000 Units Subcutaneous Q8H   insulin aspart  0-5 Units Subcutaneous QHS   insulin aspart  0-6 Units Subcutaneous TID WC   [START ON 11/22/2022] midodrine  10 mg Oral Q T,Th,Sa-HD   tamsulosin  0.4 mg Oral Daily   Continuous Infusions:  anticoagulant sodium citrate     ceFEPime (MAXIPIME) IV Stopped (11/21/22 0857)   vancomycin Stopped (11/20/22 1405)     LOS: 1 day    Time spent: 35 minutes    Barb Merino, MD [facility] Pager [phone]

## 2022-11-21 NOTE — Progress Notes (Signed)
[facility] Progress Note  Name: [name] MRN: [id number] DOB: November 15, 1955  Chief Complaint:  Confusion and leg swelling/redness   Subjective:  Feels ok today.  He had 150 mL UOP over 3/12 charted.  Last HD on 3/12 with 3.5 kg UF.  No dizziness or cramping with HD.  He states that he has been on midodrine 10 mg prior to HD for "a good while".  He states ortho just did a procedure on his leg at the bedside.   Review of systems:  Denies shortness of breath or chest pain Denies n/v  -------------- Background on consult:  [name] is a 67 y.o. male with a history of ESRD on HD TTS, HTN, HFpEF, and PAD with prior BKA who presented to the hospital with left residual limb pain and swelling as well as confusion.  He states that his wife was concerned that he had an infection due to the confusion.  He feels back to normal now in terms of mental status.  He was febrile in the ER and medicine was called for admission.  He had CT which demonstrated subcutaneous fluid and edema at the level of the distal thigh and concern for abscess; also noted knee effusion.  Follow-up MRI left tib/fib demonstrated abscess and early osteomyelitis.  He has gotten cefepime and vanc.  Nephrology is consulted for management of ESRD and dialysis.  He states left BKA was 06/2022.      Intake/Output Summary (Last 24 hours) at 11/21/2022 0926 Last data filed at 11/21/2022 0827 Gross per 24 hour  Intake 360 ml  Output 3650 ml  Net -3290 ml    Vitals:  Vitals:   11/20/22 2046 11/21/22 0028 11/21/22 0353 11/21/22 0755  BP: (!) 172/60 (!) 156/69 (!) 166/62 (!) 169/68  Pulse: 79 71 78 79  Resp: '19 20 18   '$ Temp: 99.3 F (37.4 C)  99.4 F (37.4 C) 98.5 F (36.9 C)  TempSrc: Oral  Oral Oral  SpO2: 96% 97% 96% 100%     Physical Exam:  General: adult male in bed in NAD  HEENT: NCAT Eyes: EOMI sclera anicteric Neck: supple trachea midline  Heart: S1S2 no rub Lungs: clear and unlabored on room  air  Abdomen: soft/nt/obese habitus  Extremities: bilateral BKA and the left BKA is wrapped.  Trace to 1+ edema left residual limb Neuro: alert and oriented x 3 provides hx and follows commands Psych normal mood and affect  Access LUE AVF bruit and thrill    Medications reviewed   Labs:     Latest Ref Rng & Units 11/20/2022    4:37 AM 11/19/2022    8:58 PM 07/14/2022    8:03 AM  BMP  Glucose 70 - 99 mg/dL 241  313  193   BUN 8 - 23 mg/dL 49  42  76   Creatinine 0.61 - 1.24 mg/dL 7.12  6.90  8.68   Sodium 135 - 145 mmol/L 134  134  130   Potassium 3.5 - 5.1 mmol/L 3.8  3.8  4.5   Chloride 98 - 111 mmol/L 96  92  90   CO2 22 - 32 mmol/L '23  25  24   '$ Calcium 8.9 - 10.3 mg/dL 7.9  8.6  8.5    Outpatient HD orders:  [place]  TTS 4 hours and 15 minutes BF 450 DF 800  EDW 109 kg (last outpatient post weight on 11/17/22 of 110.8 kg and got to 113.7 kg the  two treatments prior to that) 2K / 2 Ca AVF  Meds: hectorol 1 mcg every treatment; mircera 30 mcg every 4 weeks Hb 10.6 last check - mircera hasn't been given outpatient since at least Dec and was ordered to start 3/1)   Assessment/Plan:   # Abscess left residual limb  - abx per primary team - on vanc and cefepime  - ortho has been consulted and recommends just abx for now   # ESRD - HD per TTS schedule - renal panel today and in AM    # HTN  - optimize volume with HD  - noted midodrine appears pre-HD per established outpatient regimen - will order   # Heart failure with preserved EF  - optimize volume status with HD     # Anemia CKD  - on aranesp 40 mcg weekly on Thursdays while here   # Secondary hyperparathyroidism renal  - Continue hectorol    Disposition - per primary team   Claudia Desanctis, MD 11/21/2022 9:52 AM

## 2022-11-21 NOTE — Progress Notes (Signed)
Patient ID: [name], male   DOB: [birth year], 67 y.o.   MRN: [id number] Examination of the left leg shows significant improvement with the hematoma draining with decreased swelling.  The cellulitis is resolving.  I was able to express a little more of the hematoma.  There is no purulence no signs of an abscess.  Would continue the IV antibiotics.  At this time I doubt he would need any revision surgery.

## 2022-11-22 ENCOUNTER — Inpatient Hospital Stay (HOSPITAL_COMMUNITY): Payer: Medicare HMO

## 2022-11-22 DIAGNOSIS — L03116 Cellulitis of left lower limb: Secondary | ICD-10-CM | POA: Diagnosis not present

## 2022-11-22 DIAGNOSIS — R52 Pain, unspecified: Secondary | ICD-10-CM | POA: Diagnosis not present

## 2022-11-22 LAB — RENAL FUNCTION PANEL
Albumin: 2.3 g/dL — ABNORMAL LOW (ref 3.5–5.0)
Anion gap: 15 (ref 5–15)
BUN: 43 mg/dL — ABNORMAL HIGH (ref 8–23)
CO2: 25 mmol/L (ref 22–32)
Calcium: 8.7 mg/dL — ABNORMAL LOW (ref 8.9–10.3)
Chloride: 95 mmol/L — ABNORMAL LOW (ref 98–111)
Creatinine, Ser: 5.99 mg/dL — ABNORMAL HIGH (ref 0.61–1.24)
GFR, Estimated: 10 mL/min — ABNORMAL LOW (ref >60)
Glucose, Bld: 160 mg/dL — ABNORMAL HIGH (ref 70–99)
Phosphorus: 5.2 mg/dL — ABNORMAL HIGH (ref 2.5–4.6)
Potassium: 3.5 mmol/L (ref 3.5–5.1)
Sodium: 135 mmol/L (ref 135–145)

## 2022-11-22 LAB — GLUCOSE, CAPILLARY
Glucose-Capillary: 180 mg/dL — ABNORMAL HIGH (ref 70–99)
Glucose-Capillary: 173 mg/dL — ABNORMAL HIGH (ref 70–99)
Glucose-Capillary: 153 mg/dL — ABNORMAL HIGH (ref 70–99)
Glucose-Capillary: 167 mg/dL — ABNORMAL HIGH (ref 70–99)

## 2022-11-22 LAB — HEPATITIS B SURFACE ANTIBODY, QUANTITATIVE: Hep B S AB Quant (Post): 17.4 m[IU]/mL (ref >9.9)

## 2022-11-22 MED ORDER — DARBEPOETIN ALFA 40 MCG/0.4ML IJ SOSY: 40.0000 ug | PREFILLED_SYRINGE | INTRAMUSCULAR | Status: DC

## 2022-11-22 NOTE — Progress Notes (Addendum)
Called Ultrasound [phone] to follow up order (VAS [place] Upper Ext Venous Duplex),nobody answering.

## 2022-11-22 NOTE — Progress Notes (Signed)
Patient ID: [name], male   DOB: Dec 07, 1955, 67 y.o.   MRN: [id number] Examination of the left BKA the cellulitis has improved significantly from yesterday.  A small amount of residual hematoma was expressed from the wound.  This seems like it should resolve with his IV antibiotics.  Patient states he is unaware of him being on antibiotics.

## 2022-11-22 NOTE — Progress Notes (Signed)
OT Cancellation Note  Patient Details Name: [name] MRN: [id number] DOB: 1956/05/03   Cancelled Treatment:    Reason Eval/Treat Not Completed: Patient at procedure or test/ unavailable. At HD will return as schedule allows.   Elder Cyphers, OTR/L [facility] Acute Rehabilitation Office: [phone]   Magnus Ivan 11/22/2022, 10:30 AM

## 2022-11-22 NOTE — Procedures (Signed)
HD Note:  Some information was entered later than the data was gathered due to patient care needs. The stated time with the data is accurate   Received patient in bed to unit.  Alert and oriented.  Informed consent signed and in chart.   TX duration: 3.5 hours  Patient arrive diaphoretic and stated he felt hot.  Temp was within desired limits. Once on treatment he stabilized in his feeling of temperature.   During treatment his pulse began to elevate as his BP lowered.  UF was turned off.  Patient became diaphoretic again.  We were no able to meet the goal as the BP continued it's downward trend and the pulse stayed above 110 b/m.  See flowsheet for details.  Transported back to the room  Alert, without acute distress.  Hand-off given to patient's nurse.   Access used: Upper left arm fistula Access issues: None  Total UF removed: [facility]

## 2022-11-22 NOTE — Evaluation (Signed)
Physical Therapy Evaluation Patient Details Name: [name] MRN: [id number] DOB: Jun 03, 1956 Today's Date: 11/22/2022  History of Present Illness  67 year old gentleman admitted 3/11 for left residual lower limb cellulitis, and abscess. with history of ESRD on hemodialysis, bilateral below-knee amputation, coronary artery disease, severe peripheral arterial disease status post bilateral BKA  Clinical Impression  Pt admitted with above diagnosis. States he was working on ambulating with prosthesis at rehab PTA. Currently requires mod assist for bed mobility. Does not have his prosthesis here in the hospital with him (only shrinker and sleeve). He declines to get OOB, feeling tired from dialysis earlier. Worked on bed side seated balance, reaching towards limits of stability and midline recovery. Reviewed instructions for resting position of residual limbs in full extension and LE exercises to reduce atrophy. Pt currently with functional limitations due to the deficits listed below (see PT Problem List). Pt will benefit from skilled PT to increase their independence and safety with mobility to allow discharge to the venue listed below.          Recommendations for follow up therapy are one component of a multi-disciplinary discharge planning process, led by the attending physician.  Recommendations may be updated based on patient status, additional functional criteria and insurance authorization.  Follow Up Recommendations Skilled nursing-short term rehab (<3 hours/day) Can patient physically be transported by private vehicle: No    Assistance Recommended at Discharge Frequent or constant Supervision/Assistance  Patient can return home with the following  Two people to help with walking and/or transfers;A lot of help with bathing/dressing/bathroom;Assist for transportation;Help with stairs or ramp for entrance    Equipment Recommendations None recommended by PT  Recommendations for Other  Services       Functional Status Assessment Patient has had a recent decline in their functional status and demonstrates the ability to make significant improvements in function in a reasonable and predictable amount of time.     Precautions / Restrictions Precautions Precautions: Fall Restrictions Weight Bearing Restrictions:  (No orders, would presume NWB LLE until cleared by surgeon.)      Mobility  Bed Mobility Overal bed mobility: Needs Assistance Bed Mobility: Rolling, Sidelying to Sit, Sit to Sidelying Rolling: Min assist Sidelying to sit: Mod assist, HOB elevated     Sit to sidelying: Min guard, HOB elevated General bed mobility comments: Min assist to roll towards left, using rail for support, cues throughout, somewhat resistant to help at times. Mod assist for trunk support to rise to EOB. Able to lie back onto side with min guard and cues. Some assist to scoot up to [facility].    Transfers                   General transfer comment: Declines getting OOB agreeable to sit EOB only.    Ambulation/Gait                  Stairs            Wheelchair Mobility    Modified Rankin (Stroke Patients Only)       Balance                                             Pertinent Vitals/Pain Pain Assessment Pain Assessment: Faces Faces Pain Scale: Hurts a little bit Pain Location: LLE Pain Descriptors / Indicators: Aching Pain Intervention(s): Monitored during  session, Repositioned    Home Living Family/patient expects to be discharged to:: Skilled nursing facility Living Arrangements: Spouse/significant other;Children (son) Available Help at Discharge: Family;Available 24 hours/day Type of Home: House Home Access: Ramped entrance       Home Layout: One level Home Equipment: Conservation officer, nature (2 wheels);Crutches;Cane - single point;Tub bench;Grab bars - toilet;Grab bars - tub/shower;Hand held shower head;Wheelchair -  power;Wheelchair - manual;BSC/3in1;Rollator (4 wheels);Other (comment) Additional Comments: Information obtained primarily from prior admission, pt without good history recall today.    Prior Function Prior Level of Function : Driving;Needs assist             Mobility Comments: Prior to rehab pt was transfering to wheelchair but cannot walk. Since rehab pt states he was transferring with +1 assist to w/c and starting to walk with prosthesis.       Hand Dominance   Dominant Hand: Right    Extremity/Trunk Assessment   Upper Extremity Assessment Upper Extremity Assessment: Defer to OT evaluation    Lower Extremity Assessment Lower Extremity Assessment: Generalized weakness;LLE deficits/detail LLE Deficits / Details: Scant bleeding noted on sheets in bed.       Communication   Communication: No difficulties  Cognition Arousal/Alertness: Awake/alert Behavior During Therapy: WFL for tasks assessed/performed Overall Cognitive Status: No family/caregiver present to determine baseline cognitive functioning                                 General Comments: Delayed processing, decreased recall.        General Comments General comments (skin integrity, edema, etc.): Educated on LE positioning for knee extension.    Exercises Amputee Exercises Quad Sets: Strengthening, Both, 10 reps, Supine Hip Flexion/Marching: Strengthening, Both, 10 reps, Supine Other Exercises Other Exercises: Dangled EOB 5 min working on weight shifting reaching and midline recovery. Diffiuclty following cues at times.   Assessment/Plan    PT Assessment Patient needs continued PT services  PT Problem List Decreased range of motion;Decreased strength;Decreased activity tolerance;Decreased balance;Decreased mobility;Decreased cognition;Decreased knowledge of use of DME;Pain       PT Treatment Interventions DME instruction;Gait training;Functional mobility training;Therapeutic  activities;Therapeutic exercise;Balance training;Neuromuscular re-education;Patient/family education;Cognitive remediation;Modalities    PT Goals (Current goals can be found in the Care Plan section)  Acute Rehab PT Goals Patient Stated Goal: Go back to rehab PT Goal Formulation: With patient Time For Goal Achievement: 12/06/22 Potential to Achieve Goals: Good    Frequency Min 2X/week     Co-evaluation               AM-PAC PT "6 Clicks" Mobility  Outcome Measure Help needed turning from your back to your side while in a flat bed without using bedrails?: A Little Help needed moving from lying on your back to sitting on the side of a flat bed without using bedrails?: A Lot Help needed moving to and from a bed to a chair (including a wheelchair)?: Total Help needed standing up from a chair using your arms (e.g., wheelchair or bedside chair)?: Total Help needed to walk in hospital room?: Total Help needed climbing 3-5 steps with a railing? : Total 6 Click Score: 9    End of Session Equipment Utilized During Treatment: Gait belt Activity Tolerance: Patient tolerated treatment well Patient left: in bed;with call bell/phone within reach;with bed alarm set;Other (comment) (LLE extended at knee)   PT Visit Diagnosis: Muscle weakness (generalized) (M62.81);Difficulty in walking, not elsewhere classified (  R26.2);Pain Pain - Right/Left: Left Pain - part of body: Leg    Time: [id number] PT Time Calculation (min) (ACUTE ONLY): 21 min   Charges:   PT Evaluation $PT Eval Low Complexity: 1 Low          Candie Mile, PT, DPT Physical Therapist Acute Rehabilitation Services [place]   [name] 11/22/2022, 4:11 PM

## 2022-11-22 NOTE — Progress Notes (Signed)
Pt report given to Valerie,Hemodialysis RN.

## 2022-11-22 NOTE — Progress Notes (Signed)
PROGRESS NOTE    [name]  [id number] DOB: 30-Sep-1955 DOA: 11/19/2022 PCP: [name], PA-C    Brief Narrative:  67 year old gentleman with history of ESRD on hemodialysis, bilateral below-knee amputation, coronary artery disease, severe peripheral arterial disease status post bilateral BKA presented from short-term rehab due to left leg pain and subjective fever. Left leg stump was erythematous and edematous. Tmax 101.5 in the ER. Due to concern for cellulitis cultures were drawn and started on empiric antibiotics. CT scan showed fluid collection. MRI consistent with 6 x 4 cm abscess wrapping around the amputation site of the tPA. Diffuse cellulitis and mild fasciitis.    Assessment & Plan:   Left leg below-knee amputation stump infection: Hematoma versus postop seroma.  Cellulitis. Seen by [name].  Draining hematoma left transtibial amputation with cellulitis and swelling.  Currently recommending conservative management.   Blood cultures and wound cultures negative so far. Continue antibiotics with vancomycin and cefepime. Surgery not anticipating revision surgery. Adequate pain management. Work with PT OT.  Will refer back to a skilled nursing rehab.  ESRD patient on hemodialysis, TTS schedule.  Getting dialysis today.  Severe peripheral arterial disease, bilateral below-knee amputation, chronic atrial fibrillation on anticoagulation with Eliquis: Eliquis on hold.  Will continue to hold until cleared by surgery.  Type 2 diabetes with hyperglycemia: On insulin.  Continue.  Stable.  DVT prophylaxis: heparin injection 5,000 Units Start: 11/20/22 1400   Code Status: Full code Family Communication: None at the bedside Disposition Plan: Status is: Inpatient Remains inpatient appropriate because: IV antibiotics     Consultants:  Orthopedics, [name]  Procedures:  None  Antimicrobials:  Vancomycin and cefepime 3/11---   Subjective:  Receiving hemodialysis.   Complained of sweating and diaphoresis during dialysis around.  No other events. Wound inspected by [name], reportedly improving.  Objective: Vitals:   11/22/22 1231 11/22/22 1257 11/22/22 1322 11/22/22 1345  BP: (!) 140/53 (!) 127/54 (!) 150/31 (!) 160/60  Pulse:    100  Resp:    16  Temp:    98.5 F (36.9 C)  TempSrc:    Oral  SpO2:    100%  Weight:        Intake/Output Summary (Last 24 hours) at 11/22/2022 1354 Last data filed at 11/22/2022 0825 Gross per 24 hour  Intake --  Output 450 ml  Net -450 ml    Filed Weights   11/22/22 0825  Weight: 114.6 kg    Examination:  General exam: Appears calm and comfortable.  He was slightly anxious in the dialysis unit. Respiratory system: No added sounds. Cardiovascular system: S1 & S2 heard, RRR.  Gastrointestinal system: Soft.  Nontender.  Bowel sound present. Central nervous system: Alert and oriented. No focal neurological deficits. Extremities: Symmetric 5 x 5 power. Right below-knee amputation stump clean and dry. Left below-knee amputation stump on compression bandage, erythematous skin, draining hematoma. Left upper extremity AV fistula , receiving hemodialysis.   Data Reviewed: I have personally reviewed following labs and imaging studies  CBC: Recent Labs  Lab 11/19/22 2058 11/20/22 0437 11/21/22 1040  WBC 10.6* 10.1 7.5  NEUTROABS 8.3*  --   --   HGB 11.2* 9.5* 10.8*  HCT 33.6* 29.5* 31.6*  MCV 91.3 92.2 88.3  PLT 202 169 [id number]    Basic Metabolic Panel: Recent Labs  Lab 11/19/22 2058 11/20/22 0437 11/21/22 1040 11/22/22 0309  NA 134* 134* 136 135  K 3.8 3.8 3.9 3.5  CL 92* 96* 96*  95*  CO2 '25 23 27 25  '$ GLUCOSE 313* 241* 216* 160*  BUN 42* 49* 33* 43*  CREATININE 6.90* 7.12* 5.17* 5.99*  CALCIUM 8.6* 7.9* 8.6* 8.7*  MG  --  1.8  --   --   PHOS  --  4.6 4.1 5.2*    GFR: Estimated Creatinine Clearance: 15.9 mL/min (A) (by C-G formula based on SCr of 5.99 mg/dL (H)). Liver Function  Tests: Recent Labs  Lab 11/19/22 2058 11/20/22 0437 11/21/22 1040 11/22/22 0309  AST 20  --   --   --   ALT 13  --   --   --   ALKPHOS 117  --   --   --   BILITOT 0.6  --   --   --   PROT 6.7  --   --   --   ALBUMIN 2.8* 2.2* 2.3* 2.3*    No results for input(s): "LIPASE", "AMYLASE" in the last 168 hours. No results for input(s): "AMMONIA" in the last 168 hours. Coagulation Profile: No results for input(s): "INR", "PROTIME" in the last 168 hours. Cardiac Enzymes: No results for input(s): "CKTOTAL", "CKMB", "CKMBINDEX", "TROPONINI" in the last 168 hours. BNP (last 3 results) No results for input(s): "PROBNP" in the last 8760 hours. HbA1C: Recent Labs    11/20/22 0437  HGBA1C 7.0*    CBG: Recent Labs  Lab 11/21/22 1106 11/21/22 1616 11/21/22 2147 11/22/22 0733 11/22/22 1342  GLUCAP 220* 154* 175* 180* 173*    Lipid Profile: No results for input(s): "CHOL", "HDL", "LDLCALC", "TRIG", "CHOLHDL", "LDLDIRECT" in the last 72 hours. Thyroid Function Tests: No results for input(s): "TSH", "T4TOTAL", "FREET4", "T3FREE", "THYROIDAB" in the last 72 hours. Anemia Panel: No results for input(s): "VITAMINB12", "FOLATE", "FERRITIN", "TIBC", "IRON", "RETICCTPCT" in the last 72 hours. Sepsis Labs: Recent Labs  Lab 11/19/22 2058 11/20/22 0437  LATICACIDVEN 1.7 1.4     Recent Results (from the past 240 hour(s))  Culture, blood (Routine x 2)     Status: None (Preliminary result)   Collection Time: 11/19/22  9:03 PM   Specimen: BLOOD  Result Value Ref Range Status   Specimen Description BLOOD RIGHT ANTECUBITAL  Final   Special Requests   Final    BOTTLES DRAWN AEROBIC AND ANAEROBIC Blood Culture results may not be optimal due to an excessive volume of blood received in culture bottles   Culture   Final    NO GROWTH 3 DAYS Performed at [facility], [place] [address] [place], [place] [address]    Report Status PENDING  Incomplete  Culture, blood (Routine x 2)      Status: None (Preliminary result)   Collection Time: 11/20/22  9:14 PM   Specimen: BLOOD  Result Value Ref Range Status   Specimen Description BLOOD BLOOD RIGHT ARM  Final   Special Requests   Final    BOTTLES DRAWN AEROBIC AND ANAEROBIC Blood Culture results may not be optimal due to an inadequate volume of blood received in culture bottles   Culture   Final    NO GROWTH 2 DAYS Performed at [facility], [place] [address] [place], [place] [address]    Report Status PENDING  Incomplete         Radiology Studies: No results found.      Scheduled Meds:  atorvastatin  40 mg Oral QHS   Chlorhexidine Gluconate Cloth  6 each Topical Q0600   Chlorhexidine Gluconate Cloth  6 each Topical Q0600   [  START ON 11/27/2022] darbepoetin (ARANESP) injection - NON-DIALYSIS  40 mcg Subcutaneous Q Tue-1800   doxercalciferol  1 mcg Intravenous Q T,Th,Sa-HD   famotidine  20 mg Oral QODAY   fentaNYL  1 patch Transdermal Q72H   heparin  5,000 Units Subcutaneous Q8H   insulin aspart  0-5 Units Subcutaneous QHS   insulin aspart  0-6 Units Subcutaneous TID WC   lidocaine  1 patch Transdermal QHS   tamsulosin  0.4 mg Oral Daily   Continuous Infusions:  ceFEPime (MAXIPIME) IV 1 g (11/22/22 0759)   vancomycin Stopped (11/22/22 1158)     LOS: 2 days    Time spent: 35 minutes    Barb Merino, MD [facility] Pager [phone]

## 2022-11-22 NOTE — Progress Notes (Signed)
[facility] Progress Note  Name: DARKO YECK MRN: [id number] DOB: Jul 24, 1956  Chief Complaint:  Confusion and leg swelling/redness   Subjective:  Feels ok today. He had 575 mL UOP over 3/13 charted.  Last HD on 3/12 with 3.5 kg UF.  Per ortho note 3/13 he was able to express a little more of the hematoma and noted no purulence or sign of abscess.  He asks me to tell him when he has gotten his antibiotic and we reviewed past administration times as he was concerned that it wasn't happening.     Review of systems:    He has had some shortness of breath; no chest pain Denies n/v  -------------- Background on consult:  RAHZEL VIAU is a 67 y.o. male with a history of ESRD on HD TTS, HTN, HFpEF, and PAD with prior BKA who presented to the hospital with left residual limb pain and swelling as well as confusion.  He states that his wife was concerned that he had an infection due to the confusion.  He feels back to normal now in terms of mental status.  He was febrile in the ER and medicine was called for admission.  He had CT which demonstrated subcutaneous fluid and edema at the level of the distal thigh and concern for abscess; also noted knee effusion.  Follow-up MRI left tib/fib demonstrated abscess and early osteomyelitis.  He has gotten cefepime and vanc.  Nephrology is consulted for management of ESRD and dialysis.  He states left BKA was 06/2022.      Intake/Output Summary (Last 24 hours) at 11/22/2022 0729 Last data filed at 11/21/2022 2152 Gross per 24 hour  Intake 340 ml  Output 575 ml  Net -235 ml    Vitals:  Vitals:   11/21/22 0755 11/21/22 1411 11/21/22 1954 11/22/22 0457  BP: (!) 169/68 (!) 161/65 (!) 188/65 (!) 165/59  Pulse: 79 69 71 67  Resp:  '16 18 16  '$ Temp: 98.5 F (36.9 C) 98.4 F (36.9 C) 98.3 F (36.8 C) 97.8 F (36.6 C)  TempSrc: Oral Oral    SpO2: 100% 98% 98% 97%     Physical Exam:   General: adult male in bed in NAD  HEENT: NCAT Eyes:  EOMI sclera anicteric Neck: supple trachea midline  Heart: S1S2 no rub Lungs: clear and unlabored on room air  Abdomen: soft/nt/obese habitus  Extremities: bilateral BKA and the left BKA is wrapped.  Trace to 1+ edema left residual limb Neuro: alert and oriented x 3 provides hx and follows commands Psych normal mood and affect  Access LUE AVF bruit and thrill    Medications reviewed   Labs:     Latest Ref Rng & Units 11/22/2022    3:09 AM 11/21/2022   10:40 AM 11/20/2022    4:37 AM  BMP  Glucose 70 - 99 mg/dL 160  216  241   BUN 8 - 23 mg/dL 43  33  49   Creatinine 0.61 - 1.24 mg/dL 5.99  5.17  7.12   Sodium 135 - 145 mmol/L 135  136  134   Potassium 3.5 - 5.1 mmol/L 3.5  3.9  3.8   Chloride 98 - 111 mmol/L 95  96  96   CO2 22 - 32 mmol/L '25  27  23   '$ Calcium 8.9 - 10.3 mg/dL 8.7  8.6  7.9    Outpatient HD orders:  [place]  TTS 4 hours and 15 minutes BF 450  DF 800  EDW 109 kg (last outpatient post weight on 11/17/22 of 110.8 kg and got to 113.7 kg the two treatments prior to that) 2K / 2 Ca AVF  Meds: hectorol 1 mcg every treatment; mircera 30 mcg every 4 weeks Hb 10.6 last check - mircera hasn't been given outpatient since at least Dec and was ordered to start 3/1)   Assessment/Plan:   # Abscess left residual limb  - abx per primary team - on vanc and cefepime  - ortho has been consulted and recommends abx.  [name] is doubtful that patient would need a revision per charting - If antibiotics are requested with outpatient dialysis please reach out to nephrology and confirm the dosing and end date  - blood cultures NGTD   # ESRD - HD per TTS schedule - daily weights to guide care - none are charted    # HTN  - optimize volume with HD  - continue midodrine pre-HD per established outpatient regimen   # Heart failure with preserved EF  - optimize volume status with HD     # Anemia CKD  - on aranesp 40 mcg weekly on Tuesdays while here- follow trends for need to hold  or titrate   # Secondary hyperparathyroidism renal  - Continue hectorol    Disposition - per primary team   Claudia Desanctis, MD 11/22/2022 7:44 AM

## 2022-11-22 NOTE — Progress Notes (Signed)
Right upper extremity venous duplex has been completed. Preliminary results can be found in CV Proc through chart review.   11/22/22 4:25 PM [name] RVT

## 2022-11-22 NOTE — Progress Notes (Signed)
PT Cancellation Note  Patient Details Name: [name] MRN: [id number] DOB: 04-27-1956   Cancelled Treatment:    Reason Eval/Treat Not Completed: Patient at procedure or test/unavailable  Patient currently off unit for dialysis. Will follow-up for PT evaluation as schedule permits after HD session. Possibly tomorrow if still in dialysis later in the afternoon. Will check back.  Candie Mile, PT, DPT Physical Therapist Acute Rehabilitation Services [place] [facility] 11/22/2022, 10:31 AM

## 2022-11-22 NOTE — Progress Notes (Signed)
       CROSS COVER NOTE  Name: [name]  DOB: [birth year]  [id number]  DOA: 11/19/2022  Level of care: Telemetry Surgical    Date of Service   11/22/2022     HPI/Events of Note   Notified by RN of pt with c/o right arm pain, warm/ hot to touch. No redness or obvious swelling. IV intact.    Interventions/ Plan   Venous U/S doppler to r/o DVT        Raenette Rover, DNP, [place]

## 2022-11-22 NOTE — Procedures (Signed)
Seen and examined on dialysis.  Procedure supervised.  Blood pressure 187/45 and HR 76.  Tolerating goal.  LUE AVF in use. I increased the UF goal by 0.5 kg. Holding pre-HD midodrine for now.  Claudia Desanctis, MD 11/22/2022  8:38 AM

## 2022-11-22 NOTE — Evaluation (Signed)
Occupational Therapy Evaluation Patient Details Name: [name] MRN: [id number] DOB: February 14, 1956 Today's Date: 11/22/2022   History of Present Illness 67 year old gentleman admitted 3/11 for left residual lower limb cellulitis, and abscess. with history of ESRD on hemodialysis, bilateral below-knee amputation, coronary artery disease, severe peripheral arterial disease status post bilateral BKA   Clinical Impression   PTA, pt recently at rehab; pt with inconsistencies in history provided regarding PLOF. Upon eval, pt requires assist for UB and LB ADL. Pt with decreased cognition, difficulty following commands, decreased BUE strength, activity tolerance, and balance. Pt confused throughout session and requiring max cues and increased time for redirection. Good attempts to follow commands overall, but requires multimodal cueing. Recommending return to SNF for continued OT services to optimize safety and independence in ADL and IADL.      Recommendations for follow up therapy are one component of a multi-disciplinary discharge planning process, led by the attending physician.  Recommendations may be updated based on patient status, additional functional criteria and insurance authorization.   Follow Up Recommendations  Skilled nursing-short term rehab (<3 hours/day)     Assistance Recommended at Discharge Frequent or constant Supervision/Assistance  Patient can return home with the following Two people to help with walking and/or transfers;A lot of help with bathing/dressing/bathroom;Assistance with cooking/housework;Direct supervision/assist for financial management;Assist for transportation;Help with stairs or ramp for entrance;Direct supervision/assist for medications management    Functional Status Assessment  Patient has had a recent decline in their functional status and demonstrates the ability to make significant improvements in function in a reasonable and predictable amount of time.   Equipment Recommendations  Other (comment) (defer)    Recommendations for Other Services       Precautions / Restrictions Precautions Precautions: Fall Restrictions Weight Bearing Restrictions:  (no orders; would presume NWB LLE until cleared by surgeon)      Mobility Bed Mobility Overal bed mobility: Needs Assistance Bed Mobility: Rolling, Sidelying to Sit, Sit to Sidelying Rolling: Mod assist Sidelying to sit: Mod assist, HOB elevated     Sit to sidelying: Min assist General bed mobility comments: Assist for truncal support. BUE weak and difficulty using to roll. Max multimodal cues for sequencing    Transfers                   General transfer comment: Declines getting OOB agreeable to sit EOB only.      Balance                                           ADL either performed or assessed with clinical judgement   ADL Overall ADL's : Needs assistance/impaired Eating/Feeding: Set up;Bed level   Grooming: Set up;Bed level   Upper Body Bathing: Minimal assistance;Bed level   Lower Body Bathing: Maximal assistance   Upper Body Dressing : Minimal assistance;Bed level   Lower Body Dressing: Maximal assistance;Bed level;Total assistance     Toilet Transfer Details (indicate cue type and reason): deferred by pt           General ADL Comments: deferred by pt     Vision Baseline Vision/History: 0 No visual deficits Ability to See in Adequate Light: 0 Adequate Patient Visual Report: No change from baseline Vision Assessment?: No apparent visual deficits Additional Comments: Able to read signage on wall and buttons on remote     Perception  Praxis      Pertinent Vitals/Pain Pain Assessment Pain Assessment: Faces Faces Pain Scale: Hurts a little bit Pain Location: LLE Pain Descriptors / Indicators: Aching Pain Intervention(s): Limited activity within patient's tolerance, Monitored during session, Repositioned     Hand  Dominance Right   Extremity/Trunk Assessment Upper Extremity Assessment Upper Extremity Assessment: Generalized weakness (4-/5 BUE)   Lower Extremity Assessment Lower Extremity Assessment: Defer to PT evaluation LLE Deficits / Details: Scant bleeding noted on sheets in bed.       Communication Communication Communication: No difficulties;Expressive difficulties (intermittently repeating self during session)   Cognition Arousal/Alertness: Awake/alert Behavior During Therapy: WFL for tasks assessed/performed Overall Cognitive Status: No family/caregiver present to determine baseline cognitive functioning                                 General Comments: Delayed processing, decreased recall. OT retrieved a phone charger from floor on entry to reduce fall risk within the room, and after OT cleaned and gave to pt, pt begins to perseverate on wanting to be handed his cell phone (which was in pt's hand on arrival and he put in his bag). Despite max education to look in his bag, continued to state 5x "its hanging off the bed right there (pointing). Max redirection for pt to look in bag and then continued to ask OT to retrieve phone from floor after seeing it in his bag and picking it up. Pt oriented to year but not month     General Comments  educated on positioning of LLE in extension, and repositioned pillow, but once covered back up with sheet observed to return LLE to a flexed position.    Exercises     Shoulder Instructions      Home Living Family/patient expects to be discharged to:: Skilled nursing facility Living Arrangements: Spouse/significant other;Children (son) Available Help at Discharge: Family;Available 24 hours/day Type of Home: House Home Access: Ramped entrance     Home Layout: One level     Bathroom Shower/Tub: Walk-in shower;Tub/shower unit   Bathroom Toilet: Handicapped height Bathroom Accessibility: Yes   Home Equipment: Conservation officer, nature (2  wheels);Crutches;Cane - single point;Tub bench;Grab bars - toilet;Grab bars - tub/shower;Hand held shower head;Wheelchair - power;Wheelchair - manual;BSC/3in1;Rollator (4 wheels);Other (comment)   Additional Comments: Information obtained primarily from prior admission, pt without good history recall today.      Prior Functioning/Environment Prior Level of Function : Driving;Needs assist             Mobility Comments: Prior to rehab pt was transfering to wheelchair but cannot walk. Since rehab pt states he was transferring with +1 assist to w/c and starting to walk with prosthesis. ADLs Comments: Pt reports independent, but per chart review was receiving assist with mobility and transfers, and thus, likley assist with ADL        OT Problem List: Decreased strength;Decreased activity tolerance;Impaired balance (sitting and/or standing);Decreased coordination;Decreased cognition;Decreased safety awareness;Decreased knowledge of use of DME or AE;Decreased knowledge of precautions      OT Treatment/Interventions: Self-care/ADL training;Therapeutic exercise;DME and/or AE instruction;Patient/family education;Balance training;Therapeutic activities    OT Goals(Current goals can be found in the care plan section) Acute Rehab OT Goals Patient Stated Goal: none stated OT Goal Formulation: With patient Time For Goal Achievement: 12/06/22 Potential to Achieve Goals: Good  OT Frequency: Min 2X/week    Co-evaluation  AM-PAC OT "6 Clicks" Daily Activity     Outcome Measure Help from another person eating meals?: None Help from another person taking care of personal grooming?: A Little Help from another person toileting, which includes using toliet, bedpan, or urinal?: Total Help from another person bathing (including washing, rinsing, drying)?: A Lot Help from another person to put on and taking off regular upper body clothing?: A Little Help from another person to put on  and taking off regular lower body clothing?: Total 6 Click Score: 14   End of Session Nurse Communication: Mobility status  Activity Tolerance: Patient tolerated treatment well Patient left: in bed;with call bell/phone within reach;with bed alarm set  OT Visit Diagnosis: Unsteadiness on feet (R26.81);Muscle weakness (generalized) (M62.81);Other symptoms and signs involving cognitive function                Time: 1641-1700 OT Time Calculation (min): 19 min Charges:  OT General Charges $OT Visit: 1 Visit OT Evaluation $OT Eval Moderate Complexity: 1 Mod  [name], [name] [facility] Acute Rehabilitation Office: [phone]   Magnus Ivan 11/22/2022, 5:21 PM

## 2022-11-22 NOTE — Plan of Care (Signed)
  Problem: Coping: Goal: Ability to adjust to condition or change in health will improve Outcome: Progressing   Problem: Fluid Volume: Goal: Ability to maintain a balanced intake and output will improve Outcome: Progressing   Problem: Health Behavior/Discharge Planning: Goal: Ability to identify and utilize available resources and services will improve Outcome: Progressing   Problem: Metabolic: Goal: Ability to maintain appropriate glucose levels will improve Outcome: Progressing   Problem: Nutritional: Goal: Maintenance of adequate nutrition will improve Outcome: Progressing   Problem: Skin Integrity: Goal: Risk for impaired skin integrity will decrease Outcome: Progressing   Problem: Tissue Perfusion: Goal: Adequacy of tissue perfusion will improve Outcome: Progressing   

## 2022-11-23 DIAGNOSIS — L02416 Cutaneous abscess of left lower limb: Secondary | ICD-10-CM | POA: Diagnosis not present

## 2022-11-23 DIAGNOSIS — L03116 Cellulitis of left lower limb: Secondary | ICD-10-CM | POA: Diagnosis not present

## 2022-11-23 DIAGNOSIS — I5030 Unspecified diastolic (congestive) heart failure: Secondary | ICD-10-CM | POA: Diagnosis not present

## 2022-11-23 DIAGNOSIS — N2581 Secondary hyperparathyroidism of renal origin: Secondary | ICD-10-CM | POA: Diagnosis not present

## 2022-11-23 DIAGNOSIS — L0390 Cellulitis, unspecified: Secondary | ICD-10-CM | POA: Diagnosis not present

## 2022-11-23 DIAGNOSIS — N186 End stage renal disease: Secondary | ICD-10-CM | POA: Diagnosis not present

## 2022-11-23 DIAGNOSIS — I132 Hypertensive heart and chronic kidney disease with heart failure and with stage 5 chronic kidney disease, or end stage renal disease: Secondary | ICD-10-CM | POA: Diagnosis not present

## 2022-11-23 DIAGNOSIS — Z992 Dependence on renal dialysis: Secondary | ICD-10-CM | POA: Diagnosis not present

## 2022-11-23 DIAGNOSIS — Z7401 Bed confinement status: Secondary | ICD-10-CM | POA: Diagnosis not present

## 2022-11-23 DIAGNOSIS — S86999A Other injury of unspecified muscle(s) and tendon(s) at lower leg level, unspecified leg, initial encounter: Secondary | ICD-10-CM | POA: Diagnosis not present

## 2022-11-23 DIAGNOSIS — D631 Anemia in chronic kidney disease: Secondary | ICD-10-CM | POA: Diagnosis not present

## 2022-11-23 LAB — GLUCOSE, CAPILLARY
Glucose-Capillary: 190 mg/dL — ABNORMAL HIGH (ref 70–99)
Glucose-Capillary: 182 mg/dL — ABNORMAL HIGH (ref 70–99)

## 2022-11-23 MED ORDER — OXYCODONE HCL 5 MG PO TABS: 5.0000 mg | ORAL_TABLET | ORAL | 0 refills | Status: AC | PRN

## 2022-11-23 MED ORDER — DOXYCYCLINE HYCLATE 100 MG PO TABS: 100.0000 mg | ORAL_TABLET | Freq: Two times a day (BID) | ORAL | 0 refills | Status: AC

## 2022-11-23 MED ORDER — PREGABALIN 100 MG PO CAPS: 100.0000 mg | ORAL_CAPSULE | Freq: Two times a day (BID) | ORAL | 0 refills | Status: DC

## 2022-11-23 MED ORDER — DOXYCYCLINE HYCLATE 100 MG PO TABS
100.0000 mg | ORAL_TABLET | Freq: Two times a day (BID) | ORAL | Status: DC
  Administered 2022-11-23: 100 mg via ORAL
  Filled 2022-11-23: qty 1

## 2022-11-23 MED ORDER — FENTANYL 50 MCG/HR TD PT72: 1.0000 | MEDICATED_PATCH | TRANSDERMAL | 0 refills | Status: DC

## 2022-11-23 NOTE — TOC Transition Note (Signed)
Transition of Care [facility]) - CM/SW Discharge Note   Patient Details  Name: [name] MRN: [id number] Date of Birth: [birth year]  [facility]) CM/SW Contact:  Joanne Chars, LCSW Phone Number: 11/23/2022, 11:58 AM   Clinical Narrative:   Pt discharging to [facility].  RN call report to [phone].       Final next level of care: Skilled Nursing Facility Barriers to Discharge: No Barriers Identified   Patient Goals and CMS Choice   Choice offered to / list presented to : Patient, Spouse  Discharge Placement                Patient chooses bed at:  [facility]) Patient to be transferred to facility by: [facility] Name of family member notified: wife [name] Patient and family notified of of transfer: 11/23/22  Discharge Plan and Services Additional resources added to the After Visit Summary for   In-house Referral: Clinical Social Work   Post Acute Care Choice: [place]                               Social Determinants of Health ([place]) Interventions [place]: No Food Insecurity (11/20/2022)  Housing: Low Risk  (11/20/2022)  Transportation Needs: No Transportation Needs (11/20/2022)  Utilities: Not At Risk (11/20/2022)  Tobacco Use: Medium Risk (11/21/2022)     Readmission Risk Interventions    06/30/2021   11:53 AM  Readmission Risk Prevention Plan  Transportation Screening Complete  Medication Review (RN Care Manager) Complete  HRI or [place] Complete  SW Recovery Care/Counseling Consult Complete  [place] Not Applicable

## 2022-11-23 NOTE — NC FL2 (Signed)
[place] LEVEL OF CARE FORM     IDENTIFICATION  Patient Name: [name] Birthdate: 1955-10-21 Sex: male Admission Date (Current Location): 11/19/2022  [place] and [place] Number:  Kathleen Argue [id number] [place] and Address:  [facility] [place], [place] [address] [place], [place] [address]      Provider Number: [id number]  Attending Physician Name and Address:  Barb Merino, MD  Relative Name and Phone Number:  N[place]ll, Koss Spouse  [phone]    Current Level of Care: Hospital Recommended Level of Care: [place] Prior Approval Number:    Date Approved/Denied:   PASRR Number: [id number]  Discharge Plan: SNF    Current Diagnoses: Patient Active Problem List   Diagnosis Date Noted   Cellulitis 11/20/2022   Chest wall pain 07/04/2022   S/P BKA (below knee amputation) unilateral, left ([place]) 07/04/2022   Subacute osteomyelitis, left ankle and foot ([place]) 07/03/2022   Atrial fibrillation, chronic ([place]) 01/13/2022   Physical deconditioning 01/13/2022   Anemia of renal disease 01/13/2022   Obesity (BMI 30-39.9) 01/13/2022   Decubitus ulcer of buttock 01/13/2022   Intractable nausea and vomiting with epigastric pain 01/11/2022   Chronic osteomyelitis ([place]) 01/11/2022   Proliferative diabetic retinopathy of left eye with macular edema associated with type 2 diabetes mellitus ([place]) 11/27/2021   Vitreous hemorrhage of right eye ([place]) 0000000   Acute metabolic encephalopathy [id number]   COVID 09/02/2021   Sepsis (DISH) 09/02/2021   Severe sepsis with lactic acidosis ([place]) 06/14/2021   Infection associated with implanted penile prosthesis ([place]) 06/14/2021   ESRD on hemodialysis ([place]) 06/14/2021   Paroxysmal atrial fibrillation with RVR ([place]) 06/14/2021   Carotid stenosis 06/15/2020   Dyslipidemia 06/15/2020   Diabetic macular edema of right eye with proliferative retinopathy associated with type 2  diabetes mellitus ([place]) 01/18/2020   Severe nonproliferative diabetic retinopathy of left eye, with macular edema, associated with type 2 diabetes mellitus ([place]) 01/04/2020   Retinal hemorrhage of right eye 01/04/2020   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   Pseudoclaudication 11/15/2018   Essential hypertension 09/18/2018   S/P BKA (below knee amputation), right ([place]) 09/18/2018   Amputation of toe of left foot ([place]) 09/18/2018   Peripheral artery disease ([place]) 09/18/2018   S/P carotid endarterectomy 09/18/2018   OSA not tolerating CPAP 09/18/2018   Chronic back pain 09/18/2018   Status post reversal of ileostomy 05/21/2018   Normocytic anemia 02/14/2018   Intra-abdominal abscess ([place]) 11/04/2017   Chronic venous hypertension (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 ([place]) 12/11/2016   PAD (peripheral artery disease) ([place]) 11/12/2016   Unilateral primary osteoarthritis, left knee 10/11/2016   History of right below knee amputation ([place]) 07/12/2016   Infected diabetic left foot ulcer with chronic osteomyelitis    Diabetic foot infection ([place]) 05/04/2016   Insulin dependent type 2 diabetes mellitus ([place]) 12/16/2015   Amputated toe ([place]) 10/21/2015   Cellulitis of left lower extremity 09/03/2015   Leg edema, left 09/03/2015   CAD (dz of distal, mid and proximal RCA with implantation of 3 overlapping drug-eluting stent,) 09/03/2015   Chronic diastolic heart failure, NYHA class 2 ([place]) 09/03/2015   Angina pectoris associated with type 2 diabetes mellitus ([place]) 10/28/2014   Hyperlipidemia 08/25/2014   Limb pain 03/20/2013   DJD (degenerative joint disease) 09/25/2012   Migraine 09/25/2012   Neuropathy 09/25/2012   Restless legs syndrome (RLS) 09/25/2012   Chronic obstructive pulmonary disease, unspecified ([place]) 04/25/2012   Unknown cause of morbidity  or mortality 04/25/2012   Chronic total occlusion of artery of the extremities ([place]) 04/08/2012   Onychomycosis  02/01/2012   Occlusion and stenosis of carotid artery without mention of cerebral infarction 06/12/2011   GERD (gastroesophageal reflux disease) 05/08/2011   Barrett's esophagus without dysplasia 05/08/2011   Former tobacco use 02/01/2011    Orientation RESPIRATION BLADDER Height & Weight     Self, Time, Situation, Place  Normal Continent Weight: 245 lb 6 oz (111.3 kg) Height:     BEHAVIORAL SYMPTOMS/MOOD NEUROLOGICAL BOWEL NUTRITION STATUS      Continent Diet (see discharge summary)  AMBULATORY STATUS COMMUNICATION OF NEEDS Skin   Total Care Verbally Normal                       Personal Care Assistance Level of Assistance  Bathing, Feeding, Dressing Bathing Assistance: Maximum assistance Feeding assistance: Limited assistance Dressing Assistance: Maximum assistance     Functional Limitations Info  Sight, Hearing, Speech Sight Info: Adequate Hearing Info: Adequate Speech Info: Adequate    SPECIAL CARE FACTORS FREQUENCY  PT (By licensed PT), OT (By licensed OT)     PT Frequency: 5x week OT Frequency: 5x week            Contractures Contractures Info: Not present    Additional Factors Info  Code Status, Allergies, Insulin Sliding Scale Code Status Info: full Allergies Info: Contrast Media (Iodinated Contrast Media), Ivp Dye (Iodinated Contrast Media), Adhesive (Tape), Latex   Insulin Sliding Scale Info: Novolog: see discharge summary       Current Medications (11/23/2022):  This is the current hospital active medication list Current Facility-Administered Medications  Medication Dose Route Frequency Provider Last Rate Last Admin   acetaminophen (TYLENOL) tablet 650 mg  650 mg Oral Q6H PRN Irene Pap N, DO   650 mg at 11/21/22 0813   atorvastatin (LIPITOR) tablet 40 mg  40 mg Oral QHS Hall, Carole N, DO   40 mg at 11/22/22 2223   Chlorhexidine Gluconate Cloth 2 % PADS 6 each  6 each Topical Q0600 Claudia Desanctis, MD       Chlorhexidine Gluconate Cloth 2 %  PADS 6 each  6 each Topical Q0600 Claudia Desanctis, MD   6 each at 11/23/22 0619   [START ON 11/27/2022] Darbepoetin Alfa (ARANESP) injection 40 mcg  40 mcg Subcutaneous Q Tue-1800 Claudia Desanctis, MD       doxercalciferol (HECTOROL) injection 1 mcg  1 mcg Intravenous Q T,Th,Sa-HD Claudia Desanctis, MD   1 mcg at 11/22/22 [id number]   doxycycline (VIBRA-TABS) tablet 100 mg  100 mg Oral Q12H Barb Merino, MD       famotidine (PEPCID) tablet 20 mg  20 mg Oral QODAY Hall, Carole N, DO   20 mg at 11/22/22 0757   fentaNYL (DURAGESIC) 50 MCG/HR 1 patch  1 patch Transdermal Q72H Raenette Rover, NP   1 patch at 11/21/22 2101   heparin injection 5,000 Units  5,000 Units Subcutaneous Q8H Irene Pap N, DO   5,000 Units at 11/21/22 1644   insulin aspart (novoLOG) injection 0-5 Units  0-5 Units Subcutaneous QHS Hall, Carole N, DO       insulin aspart (novoLOG) injection 0-6 Units  0-6 Units Subcutaneous TID WC Irene Pap N, DO   1 Units at 11/23/22 0749   lidocaine (LIDODERM) 5 % 1 patch  1 patch Transdermal QHS Raenette Rover, NP   1 patch at 11/21/22 2355  Oral care mouth rinse  15 mL Mouth Rinse PRN Barb Merino, MD       oxyCODONE (Oxy IR/ROXICODONE) immediate release tablet 5 mg  5 mg Oral Q4H PRN Barb Merino, MD   5 mg at 11/23/22 1006   polyethylene glycol (MIRALAX / GLYCOLAX) packet 17 g  17 g Oral Daily PRN Irene Pap N, DO       prochlorperazine (COMPAZINE) injection 5 mg  5 mg Intravenous Q6H PRN Irene Pap N, DO       tamsulosin (FLOMAX) capsule 0.4 mg  0.4 mg Oral Daily Irene Pap N, DO   0.4 mg at 11/23/22 0745     Discharge Medications: Please see discharge summary for a list of discharge medications.  Relevant Imaging Results:  Relevant Lab Results:   Additional Information [id number].   HD patient  Joanne Chars, LCSW

## 2022-11-23 NOTE — Consult Note (Signed)
   [facility] CM Inpatient Consult   11/23/2022  JAMARREON MCCULLOUGH [birth year] [id number]  [facility] [employer] [ACO] Patient: [name] Medicare  Primary Care Provider:  [name], Hershal Coria, [facility]  Patient was screen for extreme high risk for 3 admissions in the past 6 months   If the patient goes to a [facility] affiliated facility then, patient can be followed by [place] [facility] PAC RN with traditional Medicare and approved Medicare Advantage plans.    Plan:   Patient went to a non-affiliated facility at [facility] Will sign off.  For questions or referrals, please contact:   Natividad Brood, RN BSN [place]  [phone] business mobile phone Toll free office [phone]  *[place]  [phone] Fax number: [phone] [email or web address]

## 2022-11-23 NOTE — Progress Notes (Signed)
[facility] Progress Note  Name: [name] MRN: [id number] DOB: [birth year]  Chief Complaint:  Confusion and leg swelling/redness   Subjective:  Last HD on 3/14 with 3.1 kg UF.  He had 200 mL UOP over 3/14 charted.  He is going to be discharging back to his SNF today.  He feels ok       Review of systems:     Shortness of breath resolved; no chest pain Denies n/v  -------------- Background on consult:  [name] is a 67 y.o. male with a history of ESRD on HD TTS, HTN, HFpEF, and PAD with prior BKA who presented to the hospital with left residual limb pain and swelling as well as confusion.  He states that his wife was concerned that he had an infection due to the confusion.  He feels back to normal now in terms of mental status.  He was febrile in the ER and medicine was called for admission.  He had CT which demonstrated subcutaneous fluid and edema at the level of the distal thigh and concern for abscess; also noted knee effusion.  Follow-up MRI left tib/fib demonstrated abscess and early osteomyelitis.  He has gotten cefepime and vanc.  Nephrology is consulted for management of ESRD and dialysis.  He states left BKA was 06/2022.      Intake/Output Summary (Last 24 hours) at 11/23/2022 1131 Last data filed at 11/22/2022 1304 Gross per 24 hour  Intake --  Output 3100 ml  Net -3100 ml    Vitals:  Vitals:   11/22/22 1345 11/22/22 1941 11/23/22 0413 11/23/22 0724  BP: (!) 160/60 (!) 157/68 (!) 183/74 (!) 162/46  Pulse: 100 87 85 79  Resp: 16 17 17 16   Temp: 98.5 F (36.9 C) 98.2 F (36.8 C) 98 F (36.7 C) 98.4 F (36.9 C)  TempSrc: Oral   Oral  SpO2: 100% 97% 96% 98%  Weight:         Physical Exam:    General: adult male in bed in NAD  HEENT: NCAT Eyes: EOMI sclera anicteric Neck: supple trachea midline  Heart: S1S2 no rub Lungs: clear and unlabored on room air  Abdomen: soft/nt/obese habitus  Extremities: bilateral BKA and the left BKA is  wrapped.  no edema left residual limb Neuro: alert and oriented x 3 provides hx and follows commands Psych normal mood and affect  Access LUE AVF bruit and thrill    Medications reviewed   Labs:     Latest Ref Rng & Units 11/22/2022    3:09 AM 11/21/2022   10:40 AM 11/20/2022    4:37 AM  BMP  Glucose 70 - 99 mg/dL 160  216  241   BUN 8 - 23 mg/dL 43  33  49   Creatinine 0.61 - 1.24 mg/dL 5.99  5.17  7.12   Sodium 135 - 145 mmol/L 135  136  134   Potassium 3.5 - 5.1 mmol/L 3.5  3.9  3.8   Chloride 98 - 111 mmol/L 95  96  96   CO2 22 - 32 mmol/L 25  27  23    Calcium 8.9 - 10.3 mg/dL 8.7  8.6  7.9    Outpatient HD orders:  [place]  TTS 4 hours and 15 minutes BF 450 DF 800  EDW 109 kg (last outpatient post weight on 11/17/22 of 110.8 kg and got to 113.7 kg the two treatments prior to that) 2K / 2 Ca AVF  Meds: hectorol  1 mcg every treatment; mircera 30 mcg every 4 weeks Hb 10.6 last check - mircera hasn't been given outpatient since at least Dec and was ordered to start 3/1)   Assessment/Plan:   # Cellulitis - left residual limb  - abx per primary team - he appears to have been transitioned to doxycycline - ortho has been consulted and recommends abx.  [name] is doubtful that patient would need a revision per charting - If antibiotics are requested with outpatient dialysis please reach out to nephrology and confirm the dosing and end date  - blood cultures NGTD   # ESRD - HD per TTS schedule - daily weights to guide care    # HTN  - optimize volume with HD  - he has been hypertensive here (A999333 systolic at one point on [id number]) so we have held his pre-HD midodrine.  Resume outpatient as appropriate   # Heart failure with preserved EF  - optimize volume status with HD     # Anemia CKD  - on aranesp 40 mcg weekly on Tuesdays while here- follow trends for need to hold or titrate   # Secondary hyperparathyroidism renal  - Continue hectorol    Disposition - per primary  team.  Nurse reports he is to be discharged today and see d/c order is in.  Spoke with renal NP re: same.   Claudia Desanctis, MD 11/23/2022 11:44 AM

## 2022-11-23 NOTE — TOC Initial Note (Signed)
Transition of Care [facility]) - Initial/Assessment Note    Patient Details  Name: [name] MRN: [id number] Date of Birth: 03-Jan-1956  Transition of Care [facility]) CM/SW Contact:    Joanne Chars, LCSW Phone Number: 11/23/2022, 11:55 AM  Clinical Narrative:     CSW met with pt for initial assessment.  Pt confirms he is in LTC at [facility] and is planning to return at [facility].  Discussed PT recommendation for PT at [facility].  Permission given to speak with wife [name].    CSW spoke with wife [name] and she also confirmed plan for pt to return to [facility], had questions about abx. RN notified and will call wife.  CSW spoke with [employer].  She confirmed pt is LTC there, discussed PT recommendation for STR. Pt is [employer], facility would do auth for this.  [place] agreeable to pt return today, they will pursue auth from the facility.    CSW messaged with Tracy/Renal regarding resumption of outpt HD.              Expected Discharge Plan: Skilled Nursing Facility Barriers to Discharge: No Barriers Identified   Patient Goals and CMS Choice Patient states their goals for this hospitalization and ongoing recovery are:: get back to rehab   Choice offered to / list presented to : Patient, Spouse      Expected Discharge Plan and Services In-house Referral: Clinical Social Work   Post Acute Care Choice: [place] Living arrangements for the past 2 months: [place] Expected Discharge Date: 11/23/22                                    Prior Living Arrangements/Services Living arrangements for the past 2 months: [place] Lives with:: Facility Resident Patient language and need for interpreter reviewed:: Yes Do you feel safe going back to the place where you live?: Yes      Need for Family Participation in Patient Care: Yes (Comment) Care giver support system in place?: Yes (comment) Current home services: Other (comment)  (na) Criminal Activity/Legal Involvement Pertinent to Current Situation/Hospitalization: No - Comment as needed  Activities of Daily Living Home Assistive Devices/Equipment: Prosthesis ADL Screening (condition at time of admission) Patient's cognitive ability adequate to safely complete daily activities?: Yes Is the patient deaf or have difficulty hearing?: No Does the patient have difficulty seeing, even when wearing glasses/contacts?: No Does the patient have difficulty concentrating, remembering, or making decisions?: No Patient able to express need for assistance with ADLs?: Yes Does the patient have difficulty dressing or bathing?: No Independently performs ADLs?: Yes (appropriate for developmental age) Does the patient have difficulty walking or climbing stairs?: Yes Weakness of Legs: Left Weakness of Arms/Hands: None  Permission Sought/Granted Permission sought to share information with : Family Supports Permission granted to share information with : Yes, Verbal Permission Granted  Share Information with NAME: wife [name]           Emotional Assessment Appearance:: Appears stated age Attitude/Demeanor/Rapport: Engaged Affect (typically observed): Appropriate, Pleasant Orientation: : Oriented to Self, Oriented to Place, Oriented to  Time, Oriented to Situation      Admission diagnosis:  Cellulitis [L03.90] Cellulitis of left lower extremity [L03.116] Sepsis, due to unspecified organism, unspecified whether acute organ dysfunction present [facility]) [A41.9] Patient Active Problem List   Diagnosis Date Noted   Cellulitis 11/20/2022   Chest  wall pain 07/04/2022   S/P BKA (below knee amputation) unilateral, left ([place]) 07/04/2022   Subacute osteomyelitis, left ankle and foot ([place]) 07/03/2022   Atrial fibrillation, chronic ([place]) 01/13/2022   Physical deconditioning 01/13/2022   Anemia of renal disease 01/13/2022   Obesity (BMI 30-39.9) 01/13/2022   Decubitus ulcer of buttock  01/13/2022   Intractable nausea and vomiting with epigastric pain 01/11/2022   Chronic osteomyelitis ([place]) 01/11/2022   Proliferative diabetic retinopathy of left eye with macular edema associated with type 2 diabetes mellitus ([place]) 11/27/2021   Vitreous hemorrhage of right eye ([place]) 0000000   Acute metabolic encephalopathy A999333   COVID 09/02/2021   Sepsis ([place]) 09/02/2021   Severe sepsis with lactic acidosis ([place]) 06/14/2021   Infection associated with implanted penile prosthesis ([place]) 06/14/2021   ESRD on hemodialysis ([place]) 06/14/2021   Paroxysmal atrial fibrillation with RVR ([place]) 06/14/2021   Carotid stenosis 06/15/2020   Dyslipidemia 06/15/2020   Diabetic macular edema of right eye with proliferative retinopathy associated with type 2 diabetes mellitus ([place]) 01/18/2020   Severe nonproliferative diabetic retinopathy of left eye, with macular edema, associated with type 2 diabetes mellitus ([place]) 01/04/2020   Retinal hemorrhage of right eye 01/04/2020   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   Pseudoclaudication 11/15/2018   Essential hypertension 09/18/2018   S/P BKA (below knee amputation), right ([place]) 09/18/2018   Amputation of toe of left foot ([place]) 09/18/2018   Peripheral artery disease ([place]) 09/18/2018   S/P carotid endarterectomy 09/18/2018   OSA not tolerating CPAP 09/18/2018   Chronic back pain 09/18/2018   Status post reversal of ileostomy 05/21/2018   Normocytic anemia 02/14/2018   Intra-abdominal abscess ([place]) 11/04/2017   Chronic venous hypertension (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 ([place]) 12/11/2016   PAD (peripheral artery disease) ([place]) 11/12/2016   Unilateral primary osteoarthritis, left knee 10/11/2016   History of right below knee amputation ([place]) 07/12/2016   Infected diabetic left foot ulcer with chronic osteomyelitis    Diabetic foot infection ([place]) 05/04/2016   Insulin dependent type 2 diabetes mellitus ([place]) 12/16/2015    Amputated toe ([place]) 10/21/2015   Cellulitis of left lower extremity 09/03/2015   Leg edema, left 09/03/2015   CAD (dz of distal, mid and proximal RCA with implantation of 3 overlapping drug-eluting stent,) 09/03/2015   Chronic diastolic heart failure, NYHA class 2 ([place]) 09/03/2015   Angina pectoris associated with type 2 diabetes mellitus ([place]) 10/28/2014   Hyperlipidemia 08/25/2014   Limb pain 03/20/2013   DJD (degenerative joint disease) 09/25/2012   Migraine 09/25/2012   Neuropathy 09/25/2012   Restless legs syndrome (RLS) 09/25/2012   Chronic obstructive pulmonary disease, unspecified ([place]) 04/25/2012   Unknown cause of morbidity or mortality 04/25/2012   Chronic total occlusion of artery of the extremities ([place]) 04/08/2012   Onychomycosis 02/01/2012   Occlusion and stenosis of carotid artery without mention of cerebral infarction 06/12/2011   GERD (gastroesophageal reflux disease) 05/08/2011   Barrett's esophagus without dysplasia 05/08/2011   Former tobacco use 02/01/2011   PCP:  [name], PA-C Pharmacy:   [facility] [place], [place] - [address] [place] [address] Phone: [phone] Fax: [phone]  [id number] [place], [place]. [address] [place] [address] Phone: [phone] Fax: [place], [place] - [address] [place] [address] Phone: [phone] Fax: [phone]     Social Determinants of  Health (SDOH) Social History: SDOH Screenings   Food Insecurity: No Food Insecurity (11/20/2022)  Housing: Low Risk  (11/20/2022)  Transportation Needs: No Transportation Needs (11/20/2022)  Utilities: Not At Risk (11/20/2022)  Tobacco Use: Medium Risk (11/21/2022)   SDOH Interventions:     Readmission Risk Interventions    06/30/2021   11:53 AM  Readmission Risk Prevention Plan  Transportation  Screening Complete  Medication Review (RN Care Manager) Complete  HRI or [place] Complete  SW Recovery Care/Counseling Consult Complete  Palliative Care Screening Not [place] Not Applicable

## 2022-11-23 NOTE — Progress Notes (Signed)
Wife updated on d/c meds and ABT. [facility]AR here to take pt to the facility. Alma Friendly at [facility] received report

## 2022-11-23 NOTE — Discharge Summary (Signed)
Physician Discharge Summary  [name] [id number] DOB: March 29, 1956 DOA: 11/19/2022  PCP: [name], PA-C  Admit date: 11/19/2022 Discharge date: 11/23/2022  Admitted From: Skilled nursing facility Disposition: Skilled nursing facility   Discharge Condition: Stable CODE STATUS: Stable Diet recommendation: Low-salt and low-carb diet  Discharge summary: 67 year old gentleman with history of ESRD on hemodialysis, bilateral below-knee amputation, coronary artery disease, severe peripheral arterial disease status post bilateral BKA presented from short-term rehab due to left leg pain and subjective fever. Left leg stump was erythematous and edematous. Tmax 101.5 in the ER. Due to concern for cellulitis cultures were drawn and started on empiric antibiotics. CT scan showed fluid collection. MRI consistent with 6 x 4 cm abscess wrapping around the amputation site of the tPA. Diffuse cellulitis and mild fasciitis.      Assessment & plan of care:   Left leg below-knee amputation stump infection: Hematoma versus postop seroma.  Cellulitis. Seen by [name].  Draining hematoma left transtibial amputation with cellulitis and swelling. Spontaneously drained.  Mostly hematoma. Blood cultures and wound cultures negative so far. Treated with vancomycin and cefepime.  Clinically improved.  Doxycycline for additional 7 days. Surgery not anticipating revision surgery. Adequate pain management.  Currently pain managed with fentanyl patch and oxycodone. Work with PT OT.  Will refer back to a skilled nursing rehab.   ESRD patient on hemodialysis, TTS schedule.  Getting dialysis on his schedule.   Severe peripheral arterial disease, bilateral below-knee amputation, chronic atrial fibrillation on anticoagulation with Eliquis:  Resume amiodarone, resume Eliquis.  No active bleeding.   Type 2 diabetes with hyperglycemia: On insulin.  Continue.  Stable.  Stable to discharge back to his skilled nursing  facility.  Discharge Diagnoses:  Active Problems:   Cellulitis    Discharge Instructions  Discharge Instructions     Call MD for:  redness, tenderness, or signs of infection (pain, swelling, redness, odor or green/yellow discharge around incision site)   Complete by: As directed    Call MD for:  severe uncontrolled pain   Complete by: As directed    Diet - low sodium heart healthy   Complete by: As directed    Diet Carb Modified   Complete by: As directed    Discharge wound care:   Complete by: As directed    Dry dressing left BKA stump daily and as needed for soiling   Increase activity slowly   Complete by: As directed       Allergies as of 11/23/2022       Reactions   Contrast Media [iodinated Contrast Media] Shortness Of Breath, Other (See Comments)   Difficulty breathing and altered mental status   Ivp Dye [iodinated Contrast Media] Anaphylaxis, Shortness Of Breath, Other (See Comments)   Breathing problems, altered mental state   Adhesive [tape] Rash, Other (See Comments)   Rash after 1 day of use Do not leave on longer then 3 days with out be changed   Latex Rash, Other (See Comments)   A severe rash appears after the first 24 hours of being placed        Medication List     STOP taking these medications    amLODipine 5 MG tablet Commonly known as: NORVASC   ondansetron 4 MG disintegrating tablet Commonly known as: ZOFRAN-ODT   oxyCODONE-acetaminophen 5-325 mg Tabs tablet Commonly known as: PERCOCET   oxyCODONE-acetaminophen 7.5-325 MG tablet Commonly known as: Percocet       TAKE these medications  amiodarone 200 MG tablet Commonly known as: PACERONE Take 1 tablet (200 mg total) by mouth daily. What changed: how much to take   apixaban 5 MG Tabs tablet Commonly known as: ELIQUIS Take 1 tablet (5 mg total) by mouth 2 (two) times daily.   atorvastatin 40 MG tablet Commonly known as: LIPITOR Take 40 mg by mouth at bedtime.    Dialyvite/Zinc Tabs Take 1 tablet by mouth Every Tuesday,Thursday,and Saturday with dialysis.   doxycycline 100 MG tablet Commonly known as: VIBRA-TABS Take 1 tablet (100 mg total) by mouth every 12 (twelve) hours for 7 days.   famotidine 20 MG tablet Commonly known as: PEPCID Take 20 mg by mouth every other day.   fentaNYL 50 MCG/HR Commonly known as: [place] 1 patch onto the skin every 3 (three) days.   folic acid 1 MG tablet Commonly known as: FOLVITE Take 1 mg by mouth daily.   GLUCOSAMINE PO Take 500 mg by mouth 2 (two) times daily.   insulin glargine 100 UNIT/ML injection Commonly known as: LANTUS Inject 0.2 mLs (20 Units total) into the skin at bedtime.   lidocaine-prilocaine cream Commonly known as: EMLA Apply 1 application topically See admin instructions. Prior to Dialysis days Tuesday,Thursday and saturday   loratadine 10 MG tablet Commonly known as: CLARITIN Take 10 mg by mouth daily.   metoCLOPramide 5 MG tablet Commonly known as: Reglan Take 1 tablet (5 mg total) by mouth 4 (four) times daily -  before meals and at bedtime. What changed: when to take this   midodrine 10 MG tablet Commonly known as: PROAMATINE Take 1 tablet (10 mg total) by mouth every other day. One hour before dialysis What changed: when to take this   NEEDLE (REUSABLE) 22 G 22G X 1-1/2" Misc 1 Units by Does not apply route as directed. For B12 IM inj   nitroGLYCERIN 0.4 MG SL tablet Commonly known as: NITROSTAT Place 0.4 mg under the tongue every 5 (five) minutes as needed for chest pain.   NovoLOG FlexPen 100 UNIT/ML FlexPen Generic drug: insulin aspart Inject 0-12 Units into the skin See admin instructions. Inject as per sliding scale: If 0 - 69 = 0 initiate hypoglycemia orders;  70 - 150 = 0;  151 - 200 = 2;  201 - 250 = 4;  251 - 300 = 6;  301 - 350 = 8;  351 - 400 = 10;  401 - 450 = 12.  If >400, give 12 units, recheck in 1 hour. If still >400, call MD/NP for  further orders. Subcutaneously with meals in addition to scheduled 4 unites with meals. What changed: Another medication with the same name was changed. Make sure you understand how and when to take each.   NovoLOG FlexPen 100 UNIT/ML FlexPen Generic drug: insulin aspart Inject 4 Units into the skin 3 (three) times daily. What changed: additional instructions   omega-3 acid ethyl esters 1 g capsule Commonly known as: LOVAZA Take 2 g by mouth 2 (two) times daily.   oxyCODONE 5 MG immediate release tablet Commonly known as: Oxy IR/ROXICODONE Take 1 tablet (5 mg total) by mouth every 4 (four) hours as needed for up to 5 days for moderate pain or severe pain.   pantoprazole 40 MG tablet Commonly known as: PROTONIX Take 1 tablet (40 mg total) by mouth 2 (two) times daily for 30 days, THEN 1 tablet (40 mg total) daily. Start taking on: Jan 14, 2022 What changed: See the new instructions.  Polyvinyl Alcohol-Povidone PF 1.4-0.6 % Soln Place 1 drop into both eyes 2 (two) times daily as needed (dry eyes).   pregabalin 100 MG capsule Commonly known as: LYRICA Take 1 capsule (100 mg total) by mouth 2 (two) times daily for 5 days.   rOPINIRole 2 MG tablet Commonly known as: REQUIP Take 2 mg by mouth at bedtime.   senna-docusate 8.6-50 MG tablet Commonly known as: Senokot-S Take 2 tablets by mouth at bedtime.   SYRINGE 3CC/22GX1-1/2" 22G X 1-1/2" 3 ML Misc 1 Syringe by Does not apply route as directed. For b12 IM inj   tamsulosin 0.4 MG Caps capsule Commonly known as: FLOMAX Take 0.4 mg by mouth daily.               Discharge Care Instructions  (From admission, onward)           Start     Ordered   11/23/22 0000  Discharge wound care:       Comments: Dry dressing left BKA stump daily and as needed for soiling   11/23/22 1127            Allergies  Allergen Reactions   Contrast Media [Iodinated Contrast Media] Shortness Of Breath and Other (See Comments)     Difficulty breathing and altered mental status     Ivp Dye [Iodinated Contrast Media] Anaphylaxis, Shortness Of Breath and Other (See Comments)    Breathing problems, altered mental state    Adhesive [Tape] Rash and Other (See Comments)    Rash after 1 day of use Do not leave on longer then 3 days with out be changed   Latex Rash and Other (See Comments)    A severe rash appears after the first 24 hours of being placed    Consultations: Orthopedics Nephrology   Procedures/Studies: VAS [place] UPPER EXTREMITY VENOUS DUPLEX  Result Date: 11/22/2022 UPPER VENOUS STUDY  Patient Name:  TAKU PEPIN  Date of Exam:   11/22/2022 Medical Rec #: [id number]      Accession #:    [id number] Date of Birth: [birth year]      Patient Gender: M Patient Age:   4 years Exam Location:  [facility] Procedure:      VAS [place] UPPER EXTREMITY VENOUS DUPLEX Referring Phys: ABIGAIL CHAVEZ --------------------------------------------------------------------------------  Indications: Pain Other Indications: Left AVF. Limitations: Poor ultrasound/tissue interface, body habitus and patient positioning, poor patient cooperation. Comparison Study: No prior studies. Performing Technologist: Oliver Hum RVT  Examination Guidelines: A complete evaluation includes B-mode imaging, spectral Doppler, color Doppler, and power Doppler as needed of all accessible portions of each vessel. Bilateral testing is considered an integral part of a complete examination. Limited examinations for reoccurring indications may be performed as noted.  Right Findings: +----------+------------+---------+-----------+----------+-------+ RIGHT     CompressiblePhasicitySpontaneousPropertiesSummary +----------+------------+---------+-----------+----------+-------+ IJV           Full       Yes       Yes                      +----------+------------+---------+-----------+----------+-------+ Subclavian    Full       Yes       Yes                       +----------+------------+---------+-----------+----------+-------+ Axillary      Full       Yes       Yes                      +----------+------------+---------+-----------+----------+-------+  Brachial      Full       Yes       Yes                      +----------+------------+---------+-----------+----------+-------+ Radial        Full                                          +----------+------------+---------+-----------+----------+-------+ Ulnar         Full                                          +----------+------------+---------+-----------+----------+-------+ Cephalic      Full                                          +----------+------------+---------+-----------+----------+-------+ Basilic       Full                                          +----------+------------+---------+-----------+----------+-------+  Left Findings: +----------+------------+---------+-----------+----------+-------+ LEFT      CompressiblePhasicitySpontaneousPropertiesSummary +----------+------------+---------+-----------+----------+-------+ Subclavian    Full       Yes       Yes                      +----------+------------+---------+-----------+----------+-------+  Summary:  Right: No evidence of deep vein thrombosis in the upper extremity. No evidence of superficial vein thrombosis in the upper extremity.  Left: No evidence of thrombosis in the subclavian.  *See table(s) above for measurements and observations.  Diagnosing physician: Orlie Pollen Electronically signed by Orlie Pollen on 11/22/2022 at 5:41:00 PM.    Final    MR TIBIA FIBULA LEFT WO CONTRAST  Result Date: 11/20/2022 CLINICAL DATA:  Pain and swelling involving the left lower extremity. History of below-knee amputation. EXAM: MRI OF LOWER LEFT EXTREMITY WITHOUT CONTRAST TECHNIQUE: Multiplanar, multisequence MR imaging of the left lower extremity was performed. No intravenous contrast was  administered. COMPARISON:  CT scan, same date. FINDINGS: Diffuse subcutaneous soft tissue swelling/edema and skin thickening consistent with cellulitis. There is also significant myofasciitis involving the below knee musculature. As demonstrated on the CT scan there is a large abscess wrapping around the amputation site of the tibia. This measures approximately 5.8 x 3.5 cm. This is adjacent to the amputation site and other low there is no gross changes of osteomyelitis in the tibia there is mild T2 signal abnormality suggesting early osteomyelitis. No findings for septic arthritis at the knee joint. IMPRESSION: 1. 5.8 x 3.5 cm abscess wrapping around the amputation site of the tibia. 2. Mild T2 signal abnormality in the tibia suggesting early osteomyelitis. 3. Diffuse cellulitis and myofasciitis. Electronically Signed   By: Marijo Sanes M.D.   On: 11/20/2022 07:19   CT EXTREMITY LOWER LEFT WO CONTRAST  Result Date: 11/20/2022 CLINICAL DATA:  Leg pain with redness and swelling EXAM: CT OF THE LOWER LEFT EXTREMITY WITHOUT CONTRAST TECHNIQUE: Multidetector CT imaging of the lower left extremity was performed according to the standard protocol. RADIATION DOSE REDUCTION: This exam  was performed according to the departmental dose-optimization program which includes automated exposure control, adjustment of the mA and/or kV according to patient size and/or use of iterative reconstruction technique. COMPARISON:  CT 06/23/2022 FINDINGS: Bones/Joint/Cartilage No significant left hip effusion. No acute fracture or malalignment. No frank osseous destructive change. Small suprapatellar knee effusion. Status post below the knee amputation with relatively smooth cut margins. Moderate patellofemoral degenerative changes. At least mild medial and lateral tibiofemoral degenerative change. Ligaments Suboptimally assessed by CT. Muscles and Tendons Negative for intramuscular fluid collection.  Mild fatty atrophy. Soft tissues  Prominent left inguinal lymph nodes measuring up to 11 mm. Moderate severe subcutaneous fluid and edema worst at the level of distal thigh, knee and proximal lower leg. Suspicion of focal fluid collection within the distal stump, this measures 5.9 by 3.9 by 2.3 cm and is distal to the cut margin of the tibia. No internal gas bubbles. IMPRESSION: 1. Status post left below the knee amputation. Moderate to severe subcutaneous fluid and edema worst at the level of the distal thigh, knee and proximal lower leg. Suspicion of focal fluid collection within the distal stump, this measures 5.9 x 3.9 x 2.3 cm and is distal to the cut margin of the tibia. No internal gas bubbles but concern is raised for soft tissue abscess. No convincing osseous destructive change at the cut margins of the tibia and fibula. Further assessment with MRI may be considered. 2. Small suprapatellar knee effusion. Electronically Signed   By: Donavan Foil M.D.   On: 11/20/2022 00:49   DG Chest Portable 1 View  Result Date: 11/19/2022 CLINICAL DATA:  Sepsis EXAM: PORTABLE CHEST 1 VIEW COMPARISON:  Chest x-ray dated July 03, 2022 FINDINGS: Cardiac and mediastinal contours are unchanged. Mild linear left perihilar opacities, likely due to atelectasis. Lungs are otherwise clear. No evidence of pleural effusion or pneumothorax. IMPRESSION: Mild linear left perihilar opacities, likely due to atelectasis. Electronically Signed   By: Yetta Glassman M.D.   On: 11/19/2022 21:51   (Echo, Carotid, EGD, Colonoscopy, ERCP)    Subjective: Patient seen and examined.  Denies any new complaints.  Pain is controlled with fentanyl patch and occasional use of oxycodone.  Afebrile.  Left leg stump much better now.   Discharge Exam: Vitals:   11/23/22 0413 11/23/22 0724  BP: (!) 183/74 (!) 162/46  Pulse: 85 79  Resp: 17 16  Temp: 98 F (36.7 C) 98.4 F (36.9 C)  SpO2: 96% 98%   Vitals:   11/22/22 1345 11/22/22 1941 11/23/22 0413 11/23/22 0724   BP: (!) 160/60 (!) 157/68 (!) 183/74 (!) 162/46  Pulse: 100 87 85 79  Resp: 16 17 17 16   Temp: 98.5 F (36.9 C) 98.2 F (36.8 C) 98 F (36.7 C) 98.4 F (36.9 C)  TempSrc: Oral   Oral  SpO2: 100% 97% 96% 98%  Weight:        General: Pt is alert, awake, not in acute distress Cardiovascular: RRR, S1/S2 +, no rubs, no gallops Respiratory: CTA bilaterally, no wheezing, no rhonchi Abdominal: Soft, NT, ND, bowel sounds + Extremities:  AV fistula left upper extremity. Right below-knee amputation stump clean and dry. Left below-knee amputation stump with slight opening on the lateral aspect, no redness.  Erythema and swelling has receded.     The results of significant diagnostics from this hospitalization (including imaging, microbiology, ancillary and laboratory) are listed below for reference.     Microbiology: Recent Results (from the past 240 hour(s))  Culture,  blood (Routine x 2)     Status: None (Preliminary result)   Collection Time: 11/19/22  9:03 PM   Specimen: BLOOD  Result Value Ref Range Status   Specimen Description BLOOD RIGHT ANTECUBITAL  Final   Special Requests   Final    BOTTLES DRAWN AEROBIC AND ANAEROBIC Blood Culture results may not be optimal due to an excessive volume of blood received in culture bottles   Culture   Final    NO GROWTH 4 DAYS Performed at [facility], [place] [address], [place], [place] [address]    Report Status PENDING  Incomplete  Culture, blood (Routine x 2)     Status: None (Preliminary result)   Collection Time: 11/20/22  9:14 PM   Specimen: BLOOD  Result Value Ref Range Status   Specimen Description BLOOD BLOOD RIGHT ARM  Final   Special Requests   Final    BOTTLES DRAWN AEROBIC AND ANAEROBIC Blood Culture results may not be optimal due to an inadequate volume of blood received in culture bottles   Culture   Final    NO GROWTH 3 DAYS Performed at [facility], [place] [address] [place], [place] [address]    Report  Status PENDING  Incomplete     Labs: BNP (last 3 results) No results for input(s): "BNP" in the last 8760 hours. Basic Metabolic Panel: Recent Labs  Lab 11/19/22 2058 11/20/22 0437 11/21/22 1040 11/22/22 0309  NA 134* 134* 136 135  K 3.8 3.8 3.9 3.5  CL 92* 96* 96* 95*  CO2 25 23 27 25   GLUCOSE 313* 241* 216* 160*  BUN 42* 49* 33* 43*  CREATININE 6.90* 7.12* 5.17* 5.99*  CALCIUM 8.6* 7.9* 8.6* 8.7*  MG  --  1.8  --   --   PHOS  --  4.6 4.1 5.2*   Liver Function Tests: Recent Labs  Lab 11/19/22 2058 11/20/22 0437 11/21/22 1040 11/22/22 0309  AST 20  --   --   --   ALT 13  --   --   --   ALKPHOS 117  --   --   --   BILITOT 0.6  --   --   --   PROT 6.7  --   --   --   ALBUMIN 2.8* 2.2* 2.3* 2.3*   No results for input(s): "LIPASE", "AMYLASE" in the last 168 hours. No results for input(s): "AMMONIA" in the last 168 hours. CBC: Recent Labs  Lab 11/19/22 2058 11/20/22 0437 11/21/22 1040  WBC 10.6* 10.1 7.5  NEUTROABS 8.3*  --   --   HGB 11.2* 9.5* 10.8*  HCT 33.6* 29.5* 31.6*  MCV 91.3 92.2 88.3  PLT 202 169 194   Cardiac Enzymes: No results for input(s): "CKTOTAL", "CKMB", "CKMBINDEX", "TROPONINI" in the last 168 hours. BNP: Invalid input(s): "POCBNP" CBG: Recent Labs  Lab 11/22/22 0733 11/22/22 1342 11/22/22 1614 11/22/22 1942 11/23/22 0721  GLUCAP 180* 173* 153* 167* 190*   D-Dimer No results for input(s): "DDIMER" in the last 72 hours. Hgb A1c No results for input(s): "HGBA1C" in the last 72 hours. Lipid Profile No results for input(s): "CHOL", "HDL", "LDLCALC", "TRIG", "CHOLHDL", "LDLDIRECT" in the last 72 hours. Thyroid function studies No results for input(s): "TSH", "T4TOTAL", "T3FREE", "THYROIDAB" in the last 72 hours.  Invalid input(s): "FREET3" Anemia work up No results for input(s): "VITAMINB12", "FOLATE", "FERRITIN", "TIBC", "IRON", "RETICCTPCT" in the last 72 hours. Urinalysis    Component Value Date/Time  COLORURINE YELLOW  11/20/2022 0212   APPEARANCEUR CLOUDY (A) 11/20/2022 0212   LABSPEC 1.010 11/20/2022 0212   PHURINE 7.0 11/20/2022 0212   GLUCOSEU >=500 (A) 11/20/2022 0212   HGBUR SMALL (A) 11/20/2022 0212   BILIRUBINUR NEGATIVE 11/20/2022 0212   KETONESUR NEGATIVE 11/20/2022 0212   PROTEINUR 100 (A) 11/20/2022 0212   UROBILINOGEN 0.2 01/14/2015 0346   NITRITE NEGATIVE 11/20/2022 0212   LEUKOCYTESUR LARGE (A) 11/20/2022 0212   Sepsis Labs Recent Labs  Lab 11/19/22 2058 11/20/22 0437 11/21/22 1040  WBC 10.6* 10.1 7.5   Microbiology Recent Results (from the past 240 hour(s))  Culture, blood (Routine x 2)     Status: None (Preliminary result)   Collection Time: 11/19/22  9:03 PM   Specimen: BLOOD  Result Value Ref Range Status   Specimen Description BLOOD RIGHT ANTECUBITAL  Final   Special Requests   Final    BOTTLES DRAWN AEROBIC AND ANAEROBIC Blood Culture results may not be optimal due to an excessive volume of blood received in culture bottles   Culture   Final    NO GROWTH 4 DAYS Performed at [facility], [place] [address], [place], [place] [address]    Report Status PENDING  Incomplete  Culture, blood (Routine x 2)     Status: None (Preliminary result)   Collection Time: 11/20/22  9:14 PM   Specimen: BLOOD  Result Value Ref Range Status   Specimen Description BLOOD BLOOD RIGHT ARM  Final   Special Requests   Final    BOTTLES DRAWN AEROBIC AND ANAEROBIC Blood Culture results may not be optimal due to an inadequate volume of blood received in culture bottles   Culture   Final    NO GROWTH 3 DAYS Performed at [facility], [place] [address], [place],  [address]    Report Status PENDING  Incomplete     Time coordinating discharge:  35 minutes  SIGNED:   Barb Merino, MD  [facility] 11/23/2022, 11:27 AM

## 2022-11-23 NOTE — Care Management Important Message (Signed)
Important Message  Patient Details  Name: TIMITHY DOHN MRN: [id number] Date of Birth: 04-30-1956   Medicare Important Message Given:  Yes     Hannah Beat 11/23/2022, 11:38 AM

## 2022-11-23 NOTE — Progress Notes (Signed)
Contacted by CSW regarding pt's return to snf today. Contacted [facility] to advise staff of pt's d/c today and that pt should resume care tomorrow.   Melven Sartorius Renal Navigator [phone]

## 2022-11-24 ENCOUNTER — Telehealth: Payer: Self-pay | Admitting: Nurse Practitioner

## 2022-11-24 LAB — CULTURE, BLOOD (ROUTINE X 2): Culture: NO GROWTH

## 2022-11-25 LAB — CULTURE, BLOOD (ROUTINE X 2): Culture: NO GROWTH

## 2022-11-27 DIAGNOSIS — Z992 Dependence on renal dialysis: Secondary | ICD-10-CM | POA: Diagnosis not present

## 2022-11-27 DIAGNOSIS — N2581 Secondary hyperparathyroidism of renal origin: Secondary | ICD-10-CM | POA: Diagnosis not present

## 2022-11-27 DIAGNOSIS — N186 End stage renal disease: Secondary | ICD-10-CM | POA: Diagnosis not present

## 2022-11-27 DIAGNOSIS — E875 Hyperkalemia: Secondary | ICD-10-CM | POA: Diagnosis not present

## 2022-11-29 DIAGNOSIS — E875 Hyperkalemia: Secondary | ICD-10-CM | POA: Diagnosis not present

## 2022-11-29 DIAGNOSIS — N2581 Secondary hyperparathyroidism of renal origin: Secondary | ICD-10-CM | POA: Diagnosis not present

## 2022-11-29 DIAGNOSIS — N186 End stage renal disease: Secondary | ICD-10-CM | POA: Diagnosis not present

## 2022-11-29 DIAGNOSIS — Z992 Dependence on renal dialysis: Secondary | ICD-10-CM | POA: Diagnosis not present

## 2022-11-30 DIAGNOSIS — E1142 Type 2 diabetes mellitus with diabetic polyneuropathy: Secondary | ICD-10-CM | POA: Diagnosis not present

## 2022-12-01 DIAGNOSIS — Z992 Dependence on renal dialysis: Secondary | ICD-10-CM | POA: Diagnosis not present

## 2022-12-01 DIAGNOSIS — N186 End stage renal disease: Secondary | ICD-10-CM | POA: Diagnosis not present

## 2022-12-01 DIAGNOSIS — E875 Hyperkalemia: Secondary | ICD-10-CM | POA: Diagnosis not present

## 2022-12-01 DIAGNOSIS — N2581 Secondary hyperparathyroidism of renal origin: Secondary | ICD-10-CM | POA: Diagnosis not present

## 2022-12-03 ENCOUNTER — Ambulatory Visit (INDEPENDENT_AMBULATORY_CARE_PROVIDER_SITE_OTHER): Payer: Medicare HMO | Admitting: Orthopedic Surgery

## 2022-12-03 DIAGNOSIS — Z89512 Acquired absence of left leg below knee: Secondary | ICD-10-CM | POA: Diagnosis not present

## 2022-12-03 DIAGNOSIS — Z89511 Acquired absence of right leg below knee: Secondary | ICD-10-CM | POA: Diagnosis not present

## 2022-12-05 ENCOUNTER — Non-Acute Institutional Stay: Payer: Medicare HMO | Admitting: Hospice

## 2022-12-05 ENCOUNTER — Encounter (HOSPITAL_COMMUNITY): Payer: Self-pay

## 2022-12-05 DIAGNOSIS — Z515 Encounter for palliative care: Secondary | ICD-10-CM | POA: Diagnosis not present

## 2022-12-05 DIAGNOSIS — Z992 Dependence on renal dialysis: Secondary | ICD-10-CM | POA: Diagnosis not present

## 2022-12-05 DIAGNOSIS — N186 End stage renal disease: Secondary | ICD-10-CM | POA: Diagnosis not present

## 2022-12-05 DIAGNOSIS — R531 Weakness: Secondary | ICD-10-CM

## 2022-12-05 DIAGNOSIS — G8929 Other chronic pain: Secondary | ICD-10-CM | POA: Diagnosis not present

## 2022-12-05 DIAGNOSIS — M549 Dorsalgia, unspecified: Secondary | ICD-10-CM | POA: Diagnosis not present

## 2022-12-05 NOTE — Progress Notes (Signed)
    [facility] Consult Note Telephone: [phone]  Fax: [phone]     PATIENT NAME: [name] [address] [place] [address]   [phone] (home)  DOB: 11-02-1955 MRN: [id number] PRIMARY CARE PROVIDER:    Fae Pippin,  [place] [address] [phone]  REFERRING PROVIDER:   [name], PA-C [address] [place],  [place] [address] [phone]  RESPONSIBLE PARTY:   Self Contact Information     Name Relation Home Work Mobile   Kemontae, Nazzal Spouse  [phone]   Yacine, Westenhaver   [phone]        I met face to face with patient in the facility. Palliative Care was asked to follow this patient by consultation request of  [name], PA-C to address advance care planning and complex medical decision making.   ASSESSMENT AND PLAN / RECOMMENDATIONS:  Goals of Care: Goals include to maximize quality of life and symptom management.  CODE STATUS: Full Code  Symptom Management/Plan: ESRD-Continue dialysis Tuesday/Thursday/Saturday, currently well tolerated. Continue dialyvite/zinc.   Generalized weakness/mobility:  PT/OT suspended due to recent cellulitis on left leg stump for which he was hospitalized 3/11 - 11/23/2022, has completed antibiotics, will resume PT/OT in a week for strengthening, gait training and ambulation with the new prosthesis. Follow up with Ortho as planned.   Pain-generalized.  Managed with Fentanyl, Percocet and Lyrica as ordered.  Continue to monitor for respiratory distress/advised reactions, and unalleviated pain. Constipation: Managed with Senna Docusate Sodium. Continue bowel regimen to prevent constipation.  Consider plan to wean with risk of decompensation. Follow up Palliative Care Visit: Palliative care will continue to follow for complex medical decision making, advance care planning, and clarification of goals. Return  prn.   PPS: 40%  HOSPICE ELIGIBILITY/DIAGNOSIS: TBD  Chief Complaint: Palliative Medicine follow up visit.   HISTORY OF PRESENT ILLNESS:  [name] is a 67 y.o. year old male  with multiple morbidities requiring close monitoring/management with high risk of complications and morbidity: ESRD on hemodialysis Tuesday-Thursday-Saturday, chronic diastolic CHF, paroxysmal atrial fibrillation, type 2 diabetes, hypertension, hyperlipidemia, CAD, hx of PCI and stent, severe PAD, bilateral BKA, chronic pain, sinus bradycardia, anemia, chronic pain, vascular wound/left foot pain, hx of osteomyelitis. Chronic left foot vascular wound-receiving HH SN for wound care; follows up with [name] monthly.  Patient in no distress today denies pain to left leg stump, reports overall doing well and looking forward to resuming physical/occupational therapy. History obtained from review of EMR, discussion with primary team, and interview with family, facility staff/caregiver and/or [name].  I reviewed available labs, medications, imaging, studies and related documents from the EMR.  Records reviewed and summarized above.  I spent  35 minutes providing this consultation; this includes time spent with patient/family, chart review and documentation. More than 50% of the time in this consultation was spent on counseling and coordinating communication. Thank you for the opportunity to participate in the care of [name].  Please call our office at [phone] if we can be of additional assistance.   Note: Portions of this note were generated with Lobbyist. Dictation errors may occur despite best attempts at proofreading.   Teodoro Spray, NP

## 2022-12-06 DIAGNOSIS — N186 End stage renal disease: Secondary | ICD-10-CM | POA: Diagnosis not present

## 2022-12-06 DIAGNOSIS — Z992 Dependence on renal dialysis: Secondary | ICD-10-CM | POA: Diagnosis not present

## 2022-12-06 DIAGNOSIS — E875 Hyperkalemia: Secondary | ICD-10-CM | POA: Diagnosis not present

## 2022-12-06 DIAGNOSIS — N2581 Secondary hyperparathyroidism of renal origin: Secondary | ICD-10-CM | POA: Diagnosis not present

## 2022-12-07 DIAGNOSIS — I10 Essential (primary) hypertension: Secondary | ICD-10-CM | POA: Diagnosis not present

## 2022-12-07 DIAGNOSIS — E113512 Type 2 diabetes mellitus with proliferative diabetic retinopathy with macular edema, left eye: Secondary | ICD-10-CM | POA: Diagnosis not present

## 2022-12-08 DIAGNOSIS — N186 End stage renal disease: Secondary | ICD-10-CM | POA: Diagnosis not present

## 2022-12-08 DIAGNOSIS — Z992 Dependence on renal dialysis: Secondary | ICD-10-CM | POA: Diagnosis not present

## 2022-12-08 DIAGNOSIS — E875 Hyperkalemia: Secondary | ICD-10-CM | POA: Diagnosis not present

## 2022-12-08 DIAGNOSIS — N2581 Secondary hyperparathyroidism of renal origin: Secondary | ICD-10-CM | POA: Diagnosis not present

## 2022-12-09 DIAGNOSIS — Z992 Dependence on renal dialysis: Secondary | ICD-10-CM | POA: Diagnosis not present

## 2022-12-09 DIAGNOSIS — N186 End stage renal disease: Secondary | ICD-10-CM | POA: Diagnosis not present

## 2022-12-09 DIAGNOSIS — E1122 Type 2 diabetes mellitus with diabetic chronic kidney disease: Secondary | ICD-10-CM | POA: Diagnosis not present

## 2022-12-11 DIAGNOSIS — E1122 Type 2 diabetes mellitus with diabetic chronic kidney disease: Secondary | ICD-10-CM | POA: Diagnosis not present

## 2022-12-11 DIAGNOSIS — D631 Anemia in chronic kidney disease: Secondary | ICD-10-CM | POA: Diagnosis not present

## 2022-12-11 DIAGNOSIS — D509 Iron deficiency anemia, unspecified: Secondary | ICD-10-CM | POA: Diagnosis not present

## 2022-12-11 DIAGNOSIS — E875 Hyperkalemia: Secondary | ICD-10-CM | POA: Diagnosis not present

## 2022-12-11 DIAGNOSIS — Z992 Dependence on renal dialysis: Secondary | ICD-10-CM | POA: Diagnosis not present

## 2022-12-11 DIAGNOSIS — N186 End stage renal disease: Secondary | ICD-10-CM | POA: Diagnosis not present

## 2022-12-11 DIAGNOSIS — L299 Pruritus, unspecified: Secondary | ICD-10-CM | POA: Diagnosis not present

## 2022-12-11 DIAGNOSIS — N2581 Secondary hyperparathyroidism of renal origin: Secondary | ICD-10-CM | POA: Diagnosis not present

## 2022-12-12 DIAGNOSIS — Z89512 Acquired absence of left leg below knee: Secondary | ICD-10-CM | POA: Diagnosis not present

## 2022-12-12 DIAGNOSIS — K5900 Constipation, unspecified: Secondary | ICD-10-CM | POA: Diagnosis not present

## 2022-12-12 DIAGNOSIS — I4891 Unspecified atrial fibrillation: Secondary | ICD-10-CM | POA: Diagnosis not present

## 2022-12-12 DIAGNOSIS — I10 Essential (primary) hypertension: Secondary | ICD-10-CM | POA: Diagnosis not present

## 2022-12-13 DIAGNOSIS — Z992 Dependence on renal dialysis: Secondary | ICD-10-CM | POA: Diagnosis not present

## 2022-12-13 DIAGNOSIS — D631 Anemia in chronic kidney disease: Secondary | ICD-10-CM | POA: Diagnosis not present

## 2022-12-13 DIAGNOSIS — N186 End stage renal disease: Secondary | ICD-10-CM | POA: Diagnosis not present

## 2022-12-13 DIAGNOSIS — E875 Hyperkalemia: Secondary | ICD-10-CM | POA: Diagnosis not present

## 2022-12-13 DIAGNOSIS — D509 Iron deficiency anemia, unspecified: Secondary | ICD-10-CM | POA: Diagnosis not present

## 2022-12-13 DIAGNOSIS — L299 Pruritus, unspecified: Secondary | ICD-10-CM | POA: Diagnosis not present

## 2022-12-13 DIAGNOSIS — E1122 Type 2 diabetes mellitus with diabetic chronic kidney disease: Secondary | ICD-10-CM | POA: Diagnosis not present

## 2022-12-13 DIAGNOSIS — N2581 Secondary hyperparathyroidism of renal origin: Secondary | ICD-10-CM | POA: Diagnosis not present

## 2022-12-16 ENCOUNTER — Encounter: Payer: Self-pay | Admitting: Orthopedic Surgery

## 2022-12-16 NOTE — Progress Notes (Signed)
Office Visit Note   Patient: [name]           Date of Birth: Aug 06, 1956           MRN: [id number] Visit Date: 12/03/2022              Requested by: Lonie Peak, PA-C [address] [place],  [place] [address] PCP: Lonie Peak, PA-C  Chief Complaint  Patient presents with   Left Leg - Follow-up    07/05/2023 left BKA       HPI: Patient is a 67 year old gentleman who is status post bilateral transtibial amputations.  Most recently left transtibial amputation July 04, 2022.  Patient is on dialysis.  Patient states the dressing has not been changed at [facility] since he was discharged.  Assessment & Plan: Visit Diagnoses:  1. S/P BKA (below knee amputation) unilateral, left (HCC)   2. S/P BKA (below knee amputation), right (HCC)     Plan: Recommend patient should start using his socket to help decrease the swelling recommended wearing the shrinker.  Daily dressing changes.  Follow-Up Instructions: Return in about 2 weeks (around 12/17/2022).   Ortho Exam  Patient is alert, oriented, no adenopathy, well-dressed, normal affect, normal respiratory effort. Examination the skin is macerated from the dressing not being changed.  His thigh ulcer is healed the cellulitis has resolved.  Imaging: No results found. No images are attached to the encounter.  Labs: Lab Results  Component Value Date   HGBA1C 7.0 (H) 11/20/2022   HGBA1C 7.6 (H) 01/11/2022   HGBA1C 7.9 (H) 06/14/2021   ESRSEDRATE 26 (H) 06/23/2022   ESRSEDRATE 40 (H) 01/13/2022   ESRSEDRATE 55 (H) 01/11/2022   CRP 14.0 (H) 06/23/2022   CRP 3.6 (H) 01/13/2022   CRP 4.2 (H) 01/11/2022   REPTSTATUS 11/25/2022 FINAL 11/20/2022   GRAMSTAIN  06/14/2021    NO ORGANISMS SEEN SQUAMOUS EPITHELIAL CELLS PRESENT ABUNDANT WBC PRESENT,BOTH PMN AND MONONUCLEAR MODERATE GRAM POSITIVE COCCI    CULT  11/20/2022    NO GROWTH 5 DAYS Performed at [facility], [address] [place], [place] [address]     LABORGA GROUP B STREP(S.AGALACTIAE)ISOLATED 06/23/2022     Lab Results  Component Value Date   ALBUMIN 2.3 (L) 11/22/2022   ALBUMIN 2.3 (L) 11/21/2022   ALBUMIN 2.2 (L) 11/20/2022   PREALBUMIN 18 06/23/2022   PREALBUMIN 19.0 05/04/2016   PREALBUMIN 25.0 12/16/2015    Lab Results  Component Value Date   MG 1.8 11/20/2022   MG 1.5 (L) 06/29/2022   MG 1.5 (L) 06/28/2022   No results found for: "VD25OH"  Lab Results  Component Value Date   PREALBUMIN 18 06/23/2022   PREALBUMIN 19.0 05/04/2016   PREALBUMIN 25.0 12/16/2015      Latest Ref Rng & Units 11/21/2022   10:40 AM 11/20/2022    4:37 AM 11/19/2022    8:58 PM  CBC EXTENDED  WBC 4.0 - 10.5 K/uL 7.5  10.1  10.6   RBC 4.22 - 5.81 MIL/uL 3.58  3.20  3.68   Hemoglobin 13.0 - 17.0 g/dL 82.9  9.5  56.2   HCT 13.0 - 52.0 % 31.6  29.5  33.6   Platelets 150 - 400 K/uL 194  169  202   NEUT# 1.7 - 7.7 K/uL   8.3   Lymph# 0.7 - 4.0 K/uL   1.2      There is no height or weight on file to calculate BMI.  Orders:  No orders of the defined types were placed in this encounter.  No orders of the defined types were placed in this encounter.    Procedures: No procedures performed  Clinical Data: No additional findings.  ROS:  All other systems negative, except as noted in the HPI. Review of Systems  Objective: Vital Signs: There were no vitals taken for this visit.  Specialty Comments:  No specialty comments available.  PMFS History: Patient Active Problem List   Diagnosis Date Noted   Cellulitis 11/20/2022   Chest wall pain 07/04/2022   S/P BKA (below knee amputation) unilateral, left 07/04/2022   Subacute osteomyelitis, left ankle and foot 07/03/2022   Atrial fibrillation, chronic 01/13/2022   Physical deconditioning 01/13/2022   Anemia of renal disease 01/13/2022   Obesity (BMI 30-39.9) 01/13/2022   Decubitus ulcer of buttock 01/13/2022   Intractable nausea and vomiting with epigastric pain 01/11/2022    Chronic osteomyelitis 01/11/2022   Proliferative diabetic retinopathy of left eye with macular edema associated with type 2 diabetes mellitus 11/27/2021   Vitreous hemorrhage of right eye 11/27/2021   Acute metabolic encephalopathy 09/05/2021   COVID 09/02/2021   Sepsis 09/02/2021   Severe sepsis with lactic acidosis 06/14/2021   Infection associated with implanted penile prosthesis 06/14/2021   ESRD on hemodialysis 06/14/2021   Paroxysmal atrial fibrillation with RVR 06/14/2021   Carotid stenosis 06/15/2020   Dyslipidemia 06/15/2020   Diabetic macular edema of right eye with proliferative retinopathy associated with type 2 diabetes mellitus 01/18/2020   Severe nonproliferative diabetic retinopathy of left eye, with macular edema, associated with type 2 diabetes mellitus 01/04/2020   Retinal hemorrhage of right eye 01/04/2020   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   Pseudoclaudication 11/15/2018   Essential hypertension 09/18/2018   S/P BKA (below knee amputation), right 09/18/2018   Amputation of toe of left foot 09/18/2018   Peripheral artery disease 09/18/2018   S/P carotid endarterectomy 09/18/2018   OSA not tolerating CPAP 09/18/2018   Chronic back pain 09/18/2018   Status post reversal of ileostomy 05/21/2018   Normocytic anemia 02/14/2018   Intra-abdominal abscess 11/04/2017   Chronic venous hypertension (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 12/11/2016   PAD (peripheral artery disease) 11/12/2016   Unilateral primary osteoarthritis, left knee 10/11/2016   History of right below knee amputation 07/12/2016   Infected diabetic left foot ulcer with chronic osteomyelitis    Diabetic foot infection 05/04/2016   Insulin dependent type 2 diabetes mellitus 12/16/2015   Amputated toe 10/21/2015   Cellulitis of left lower extremity 09/03/2015   Leg edema, left 09/03/2015   CAD (dz of distal, mid and proximal RCA with implantation of 3 overlapping drug-eluting  stent,) 09/03/2015   Chronic diastolic heart failure, NYHA class 2 09/03/2015   Angina pectoris associated with type 2 diabetes mellitus 10/28/2014   Hyperlipidemia 08/25/2014   Limb pain 03/20/2013   DJD (degenerative joint disease) 09/25/2012   Migraine 09/25/2012   Neuropathy 09/25/2012   Restless legs syndrome (RLS) 09/25/2012   Chronic obstructive pulmonary disease, unspecified 04/25/2012   Unknown cause of morbidity or mortality 04/25/2012   Chronic total occlusion of artery of the extremities 04/08/2012   Onychomycosis 02/01/2012   Occlusion and stenosis of carotid artery without mention of cerebral infarction 06/12/2011   GERD (gastroesophageal reflux disease) 05/08/2011   Barrett's esophagus without dysplasia 05/08/2011   Former tobacco use 02/01/2011   Past Medical History:  Diagnosis Date   Carotid artery occlusion 11/10/10   LEFT  CAROTID ENDARTERECTOMY   Chronic kidney disease    Complication of anesthesia    BP WENT UP AT [facility] "   COPD (chronic obstructive pulmonary disease)    pt denies this dx as of 06/01/20 - no inhaler    Diabetes mellitus without complication    Diverticulitis    Diverticulosis of colon (without mention of hemorrhage)    DJD (degenerative joint disease)    knees/hands/feet/back/neck   Fatty liver    Full dentures    GERD (gastroesophageal reflux disease)    H/O hiatal hernia    History of blood transfusion    with a past surical procedure per patient 06/01/20   Hyperlipidemia    Hypertension    Neuromuscular disorder    peripheral neuropathy   Non-pressure chronic ulcer of other part of left foot limited to breakdown of skin 11/12/2016   Osteomyelitis    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 Atherectomy left popliteal artery July 2012.    Pseudoclaudication 11/15/2018   Sleep apnea    pt denies this dx as of 06/01/20   Slurred speech    AS PER WIFE IN D/C NOTE 11/10/10   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   Unstable angina 09/16/2018   Wears glasses     Family History  Problem Relation Age of Onset   Heart disease Father        Before age 59-  CAD, BPG   Diabetes Father        Amputation   Cancer Father        PROSTATE   Hyperlipidemia Father    Hypertension Father    Heart attack Father        Triple BPG   Varicose Veins Father    Cancer Sister        Breast   Hyperlipidemia Sister    Hypertension Sister    Heart attack Brother    Colon cancer Brother    Diabetes Brother    Heart disease Brother 21       A-Fib. Before age 106   Hyperlipidemia Brother    Hypertension Brother    Hypertension Son    Arthritis Other        GRANDMOTHER   Hypertension Other        OTHER FAMILY MEMBERS    Past Surgical History:  Procedure Laterality Date   ABDOMINAL AORTOGRAM W/LOWER EXTREMITY N/A 06/23/2021   Procedure: ABDOMINAL AORTOGRAM W/LOWER EXTREMITY;  Surgeon: Elder Negus, MD;  [facility];  Service: Cardiovascular;  Laterality: N/A;   AMPUTATION  11/05/2011   Procedure: AMPUTATION RAY;  Surgeon: Toni Arthurs, MD;  Location: [facility];  Service: Orthopedics;  Laterality: Right;  Amputation of Right 4&5th Toes   AMPUTATION Left 11/26/2012   Procedure: AMPUTATION RAY;  Surgeon: Toni Arthurs, MD;  Location: [facility];  Service: Orthopedics;  Laterality: Left;  fourth ray amputation   AMPUTATION Right 08/27/2014   Procedure: Transmetatarsal Amputation;  Surgeon: Nadara Mustard, MD;  Location: [facility] OR;  Service: Orthopedics;  Laterality: Right;   AMPUTATION Right 01/14/2015   Procedure: AMPUTATION BELOW KNEE;  Surgeon: Nadara Mustard, MD;  [facility]: [facility];  Service: Orthopedics;  Laterality: Right;   AMPUTATION Left 10/21/2015   Procedure: Left Foot 5th Ray Amputation;  Surgeon: Nadara Mustard, MD;  Location: [facility] OR;  Service: Orthopedics;  Laterality: Left;   AMPUTATION Left 07/04/2022   Procedure: LEFT BELOW KNEE  AMPUTATION;  Surgeon: Nadara Mustarduda, Nemesis Rainwater V, MD;  [facility];   Service: Orthopedics;  Laterality: Left;   ANTERIOR FUSION CERVICAL SPINE  02/06/06   C4-5, C5-6, C6-7; SURGEON DR. MAX COHEN   AV FISTULA PLACEMENT Left 06/02/2020   Procedure: ARTERIOVENOUS (AV) FISTULA CREATION LEFT;  Surgeon: Maeola Harmanain, Brandon Christopher, MD;  [facility];  Service: Vascular;  Laterality: Left;   BACK SURGERY     x 3   BASCILIC VEIN TRANSPOSITION Left 07/21/2020   Procedure: LEFT UPPER ARM ATERIOVENOUS SUPERFISTULALIZATION;  Surgeon: Maeola Harmanain, Brandon Christopher, MD;  [facility];  Service: Vascular;  Laterality: Left;   BELOW KNEE LEG AMPUTATION Right    CARDIAC CATHETERIZATION  10/31/04   2009   CAROTID ENDARTERECTOMY  11/10/10   CAROTID ENDARTERECTOMY Left 11/10/2010   Subtotal occlusion of left internal carotid artery with left hemispheric transient ischemic attacks.   CAROTID STENT     CARPAL TUNNEL RELEASE Right 10/21/2013   Procedure: RIGHT CARPAL TUNNEL RELEASE;  Surgeon: Nicki ReaperGary R Kuzma, MD;  Location: [facility];  Service: Orthopedics;  Laterality: Right;   CHOLECYSTECTOMY     COLON SURGERY     COLONOSCOPY     COLOSTOMY REVERSAL  05/21/2018   ileostomy reversal   CYSTOSCOPY WITH STENT PLACEMENT Bilateral 01/13/2018   Procedure: CYSTOSCOPY WITH BILATERAL URETERAL CATHETER PLACEMENT;  Surgeon: Crist FatHerrick, Benjamin W, MD;  Location: [facility];  Service: Urology;  Laterality: Bilateral;   ESOPHAGEAL MANOMETRY Bilateral 07/19/2014   Procedure: ESOPHAGEAL MANOMETRY (EM);  Surgeon: Beverley FiedlerJay M Pyrtle, MD;  Location: [facility];  Service: Gastroenterology;  Laterality: Bilateral;   EYE SURGERY Bilateral 2020   cataract   FEMORAL ARTERY STENT     x6   FINGER SURGERY     FOOT SURGERY  04/25/2016    EXCISION BASE 5TH METATARSAL AND PARTIAL CUBOID LEFT FOOT   HERNIA REPAIR     LEFT INGUINAL AND UMBILICAL REPAIRS   HERNIA REPAIR     I & D EXTREMITY Left 04/25/2016   Procedure: EXCISION BASE 5TH METATARSAL AND PARTIAL CUBOID LEFT FOOT;  Surgeon: Nadara MustardMarcus V Brooklinn Longbottom, MD;   [facility]: [facility];  Service: Orthopedics;  Laterality: Left;   ILEOSTOMY  01/13/2018   Procedure: ILEOSTOMY;  Surgeon: Berna Bueonnor, Chelsea A, MD;  Location: [facility];  Service: General;;   ILEOSTOMY CLOSURE N/A 05/21/2018   Procedure: ILEOSTOMY REVERSAL ERAS PATHWAY;  Surgeon: Berna Bueonnor, Chelsea A, MD;  [facility]: [facility];  Service: General;  Laterality: N/A;   IR RADIOLOGIST EVAL & MGMT  11/19/2017   IR RADIOLOGIST EVAL & MGMT  12/03/2017   IR RADIOLOGIST EVAL & MGMT  12/18/2017   JOINT REPLACEMENT Right 2001   Total knee   LAMINECTOMY     X 3 LUMBAR AND X 2 CERVICAL SPINE OPERATIONS   LAPAROSCOPIC CHOLECYSTECTOMY W/ CHOLANGIOGRAPHY  11/09/04   SURGEON DR. Caleen EssexPAUL S. TOTH   LEFT HEART CATH AND CORONARY ANGIOGRAPHY N/A 09/16/2018   Procedure: LEFT HEART CATH AND CORONARY ANGIOGRAPHY;  Surgeon: Elder NegusPatwardhan, Manish J, MD;  [facility];  Service: Cardiovascular;  Laterality: N/A;   LEFT HEART CATHETERIZATION WITH CORONARY ANGIOGRAM N/A 10/29/2014   Procedure: LEFT HEART CATHETERIZATION WITH CORONARY ANGIOGRAM;  Surgeon: Pamella PertJagadeesh R Ganji, MD;  [facility];  Service: Cardiovascular;  Laterality: N/A;   LIGATION OF COMPETING BRANCHES OF ARTERIOVENOUS FISTULA Left 07/21/2020   Procedure: LIGATION OF COMPETING BRANCHES OF LEFT UPPER ARM ARTERIOVENOUS FISTULA;  Surgeon: Maeola Harmanain, Brandon Christopher, MD;  [facility];  Service: Vascular;  Laterality: Left;   LOWER EXTREMITY ANGIOGRAM N/A 03/19/2012   Procedure: LOWER EXTREMITY ANGIOGRAM;  Surgeon: Kathleene Hazel, MD;  Location: [facility] CATH LAB;  Service: Cardiovascular;  Laterality: N/A;   LOWER EXTREMITY ANGIOGRAPHY N/A 06/20/2021   Procedure: LOWER EXTREMITY ANGIOGRAPHY;  Surgeon: Elder Negus, MD;  [facility];  Service: Cardiovascular;  Laterality: N/A;   NECK SURGERY     PARTIAL COLECTOMY N/A 01/13/2018   Procedure: LAPAROSCOPIC ASSISTED   SIGMOID COLECTOMY ILEOSTOMY;  Surgeon: Berna Bue, MD;  [facility]: [facility];   Service: General;  Laterality: N/A;   PENILE PROSTHESIS IMPLANT  08/14/05   INFRAPUBIC INSERTION OF INFLATABLE PENILE PROSTHESIS; SURGEON DR. Logan Bores   PENILE PROSTHESIS IMPLANT     PERCUTANEOUS CORONARY STENT INTERVENTION (PCI-S) Right 10/29/2014   Procedure: PERCUTANEOUS CORONARY STENT INTERVENTION (PCI-S);  Surgeon: Pamella Pert, MD;  Location: [facility] CATH LAB;  Service: Cardiovascular;  Laterality: Right;   PERIPHERAL VASCULAR INTERVENTION Left 06/23/2021   Procedure: PERIPHERAL VASCULAR INTERVENTION;  Surgeon: Elder Negus, MD;  [facility];  Service: Cardiovascular;  Laterality: Left;   REMOVAL OF PENILE PROSTHESIS N/A 06/14/2021   Procedure: Removal of THREE piece inflatable penile prosthesis;  Surgeon: Crista Elliot, MD;  Location: [facility] OR;  Service: Urology;  Laterality: N/A;   SHOULDER ARTHROSCOPY     SPINE SURGERY     TOE AMPUTATION Left    TONSILLECTOMY     TOTAL KNEE ARTHROPLASTY  07/2002   RIGHT KNEE ; SURGEON  DR. [name] ALSO HAD ARTHROSCOPIC RIGHT KNEE IN  10/2001   TOTAL KNEE ARTHROPLASTY     ULNAR NERVE TRANSPOSITION Right 10/21/2013   Procedure: RIGHT ELBOW  ULNAR NERVE DECOMPRESSION;  Surgeon: Nicki Reaper, MD;  Location: [facility];  Service: Orthopedics;  Laterality: Right;   Social History   Occupational History   Occupation: Publishing rights manager: UNEMPLOYED  Tobacco Use   Smoking status: Former    Packs/day: 2.00    Years: 35.00    Additional pack years: 0.00    Total pack years: 70.00    Types: Cigarettes    Quit date: 10/28/2011    Years since quitting: 11.1   Smokeless tobacco: Never  Vaping Use   Vaping Use: Some days   Substances: Nicotine  Substance and Sexual Activity   Alcohol use: Not Currently    Comment: "not in a long time"   Drug use: Never   Sexual activity: Yes    Birth control/protection: Implant    Comment: penile implant

## 2022-12-18 DIAGNOSIS — G8929 Other chronic pain: Secondary | ICD-10-CM | POA: Diagnosis not present

## 2022-12-18 DIAGNOSIS — D631 Anemia in chronic kidney disease: Secondary | ICD-10-CM | POA: Diagnosis not present

## 2022-12-18 DIAGNOSIS — Z992 Dependence on renal dialysis: Secondary | ICD-10-CM | POA: Diagnosis not present

## 2022-12-18 DIAGNOSIS — E875 Hyperkalemia: Secondary | ICD-10-CM | POA: Diagnosis not present

## 2022-12-18 DIAGNOSIS — E1122 Type 2 diabetes mellitus with diabetic chronic kidney disease: Secondary | ICD-10-CM | POA: Diagnosis not present

## 2022-12-18 DIAGNOSIS — M1990 Unspecified osteoarthritis, unspecified site: Secondary | ICD-10-CM | POA: Diagnosis not present

## 2022-12-18 DIAGNOSIS — D509 Iron deficiency anemia, unspecified: Secondary | ICD-10-CM | POA: Diagnosis not present

## 2022-12-18 DIAGNOSIS — N186 End stage renal disease: Secondary | ICD-10-CM | POA: Diagnosis not present

## 2022-12-18 DIAGNOSIS — N2581 Secondary hyperparathyroidism of renal origin: Secondary | ICD-10-CM | POA: Diagnosis not present

## 2022-12-18 DIAGNOSIS — L299 Pruritus, unspecified: Secondary | ICD-10-CM | POA: Diagnosis not present

## 2022-12-20 DIAGNOSIS — E1122 Type 2 diabetes mellitus with diabetic chronic kidney disease: Secondary | ICD-10-CM | POA: Diagnosis not present

## 2022-12-20 DIAGNOSIS — N2581 Secondary hyperparathyroidism of renal origin: Secondary | ICD-10-CM | POA: Diagnosis not present

## 2022-12-20 DIAGNOSIS — L299 Pruritus, unspecified: Secondary | ICD-10-CM | POA: Diagnosis not present

## 2022-12-20 DIAGNOSIS — N186 End stage renal disease: Secondary | ICD-10-CM | POA: Diagnosis not present

## 2022-12-20 DIAGNOSIS — D509 Iron deficiency anemia, unspecified: Secondary | ICD-10-CM | POA: Diagnosis not present

## 2022-12-20 DIAGNOSIS — E875 Hyperkalemia: Secondary | ICD-10-CM | POA: Diagnosis not present

## 2022-12-20 DIAGNOSIS — Z992 Dependence on renal dialysis: Secondary | ICD-10-CM | POA: Diagnosis not present

## 2022-12-20 DIAGNOSIS — D631 Anemia in chronic kidney disease: Secondary | ICD-10-CM | POA: Diagnosis not present

## 2022-12-22 DIAGNOSIS — E875 Hyperkalemia: Secondary | ICD-10-CM | POA: Diagnosis not present

## 2022-12-22 DIAGNOSIS — E1122 Type 2 diabetes mellitus with diabetic chronic kidney disease: Secondary | ICD-10-CM | POA: Diagnosis not present

## 2022-12-22 DIAGNOSIS — D631 Anemia in chronic kidney disease: Secondary | ICD-10-CM | POA: Diagnosis not present

## 2022-12-22 DIAGNOSIS — L299 Pruritus, unspecified: Secondary | ICD-10-CM | POA: Diagnosis not present

## 2022-12-22 DIAGNOSIS — D509 Iron deficiency anemia, unspecified: Secondary | ICD-10-CM | POA: Diagnosis not present

## 2022-12-22 DIAGNOSIS — N186 End stage renal disease: Secondary | ICD-10-CM | POA: Diagnosis not present

## 2022-12-22 DIAGNOSIS — N2581 Secondary hyperparathyroidism of renal origin: Secondary | ICD-10-CM | POA: Diagnosis not present

## 2022-12-22 DIAGNOSIS — Z992 Dependence on renal dialysis: Secondary | ICD-10-CM | POA: Diagnosis not present

## 2022-12-24 ENCOUNTER — Ambulatory Visit (INDEPENDENT_AMBULATORY_CARE_PROVIDER_SITE_OTHER): Payer: Medicare HMO | Admitting: Orthopedic Surgery

## 2022-12-24 DIAGNOSIS — Z89512 Acquired absence of left leg below knee: Secondary | ICD-10-CM | POA: Diagnosis not present

## 2022-12-25 ENCOUNTER — Encounter: Payer: Self-pay | Admitting: Orthopedic Surgery

## 2022-12-25 DIAGNOSIS — D631 Anemia in chronic kidney disease: Secondary | ICD-10-CM | POA: Diagnosis not present

## 2022-12-25 DIAGNOSIS — N2581 Secondary hyperparathyroidism of renal origin: Secondary | ICD-10-CM | POA: Diagnosis not present

## 2022-12-25 DIAGNOSIS — Z992 Dependence on renal dialysis: Secondary | ICD-10-CM | POA: Diagnosis not present

## 2022-12-25 DIAGNOSIS — N186 End stage renal disease: Secondary | ICD-10-CM | POA: Diagnosis not present

## 2022-12-25 DIAGNOSIS — L299 Pruritus, unspecified: Secondary | ICD-10-CM | POA: Diagnosis not present

## 2022-12-25 DIAGNOSIS — D509 Iron deficiency anemia, unspecified: Secondary | ICD-10-CM | POA: Diagnosis not present

## 2022-12-25 DIAGNOSIS — E1122 Type 2 diabetes mellitus with diabetic chronic kidney disease: Secondary | ICD-10-CM | POA: Diagnosis not present

## 2022-12-25 DIAGNOSIS — E875 Hyperkalemia: Secondary | ICD-10-CM | POA: Diagnosis not present

## 2022-12-25 NOTE — Progress Notes (Signed)
Office Visit Note   Patient: ROEL DOUTHAT           Date of Birth: Dec 24, 1955           MRN: [id number] Visit Date: 12/24/2022              Requested by: Lonie Peak, PA-C [address] [place],  [place] [address] PCP: Lonie Peak, PA-C  Chief Complaint  Patient presents with   Left Leg - Follow-up    07/04/2022 left BKA       HPI: Patient is a 67 year old gentleman who is over 5 months status post left transtibial amputation.  Surgery on October 25.  Assessment & Plan: Visit Diagnoses:  1. S/P BKA (below knee amputation) unilateral, left     Plan: Patient may begin using the liner and advance to prosthetic use as tolerated.  Follow-Up Instructions: Return in about 4 weeks (around 01/21/2023).   Ortho Exam  Patient is alert, oriented, no adenopathy, well-dressed, normal affect, normal respiratory effort. Examination patient has 1 abrasion over the residual limb this was touched with silver nitrate this was superficial 10 mm in diameter 0.1 mm deep with no exposed bone tendon.  The residual limb has good consolidation in good shape.  Imaging: No results found. No images are attached to the encounter.  Labs: Lab Results  Component Value Date   HGBA1C 7.0 (H) 11/20/2022   HGBA1C 7.6 (H) 01/11/2022   HGBA1C 7.9 (H) 06/14/2021   ESRSEDRATE 26 (H) 06/23/2022   ESRSEDRATE 40 (H) 01/13/2022   ESRSEDRATE 55 (H) 01/11/2022   CRP 14.0 (H) 06/23/2022   CRP 3.6 (H) 01/13/2022   CRP 4.2 (H) 01/11/2022   REPTSTATUS 11/25/2022 FINAL 11/20/2022   GRAMSTAIN  06/14/2021    NO ORGANISMS SEEN SQUAMOUS EPITHELIAL CELLS PRESENT ABUNDANT WBC PRESENT,BOTH PMN AND MONONUCLEAR MODERATE GRAM POSITIVE COCCI    CULT  11/20/2022    NO GROWTH 5 DAYS Performed at [facility], [address], [place], [place] [address]    LABORGA GROUP B STREP(S.AGALACTIAE)ISOLATED 06/23/2022     Lab Results  Component Value Date   ALBUMIN 2.3 (L) 11/22/2022   ALBUMIN 2.3 (L)  11/21/2022   ALBUMIN 2.2 (L) 11/20/2022   PREALBUMIN 18 06/23/2022   PREALBUMIN 19.0 05/04/2016   PREALBUMIN 25.0 12/16/2015    Lab Results  Component Value Date   MG 1.8 11/20/2022   MG 1.5 (L) 06/29/2022   MG 1.5 (L) 06/28/2022   No results found for: "VD25OH"  Lab Results  Component Value Date   PREALBUMIN 18 06/23/2022   PREALBUMIN 19.0 05/04/2016   PREALBUMIN 25.0 12/16/2015      Latest Ref Rng & Units 11/21/2022   10:40 AM 11/20/2022    4:37 AM 11/19/2022    8:58 PM  CBC EXTENDED  WBC 4.0 - 10.5 K/uL 7.5  10.1  10.6   RBC 4.22 - 5.81 MIL/uL 3.58  3.20  3.68   Hemoglobin 13.0 - 17.0 g/dL 82.9  9.5  56.2   HCT 13.0 - 52.0 % 31.6  29.5  33.6   Platelets 150 - 400 K/uL 194  169  202   NEUT# 1.7 - 7.7 K/uL   8.3   Lymph# 0.7 - 4.0 K/uL   1.2      There is no height or weight on file to calculate BMI.  Orders:  No orders of the defined types were placed in this encounter.  No orders of the defined types were  placed in this encounter.    Procedures: No procedures performed  Clinical Data: No additional findings.  ROS:  All other systems negative, except as noted in the HPI. Review of Systems  Objective: Vital Signs: There were no vitals taken for this visit.  Specialty Comments:  No specialty comments available.  PMFS History: Patient Active Problem List   Diagnosis Date Noted   Cellulitis 11/20/2022   Chest wall pain 07/04/2022   S/P BKA (below knee amputation) unilateral, left 07/04/2022   Subacute osteomyelitis, left ankle and foot 07/03/2022   Atrial fibrillation, chronic 01/13/2022   Physical deconditioning 01/13/2022   Anemia of renal disease 01/13/2022   Obesity (BMI 30-39.9) 01/13/2022   Decubitus ulcer of buttock 01/13/2022   Intractable nausea and vomiting with epigastric pain 01/11/2022   Chronic osteomyelitis 01/11/2022   Proliferative diabetic retinopathy of left eye with macular edema associated with type 2 diabetes mellitus  11/27/2021   Vitreous hemorrhage of right eye 11/27/2021   Acute metabolic encephalopathy 09/05/2021   COVID 09/02/2021   Sepsis 09/02/2021   Severe sepsis with lactic acidosis 06/14/2021   Infection associated with implanted penile prosthesis 06/14/2021   ESRD on hemodialysis 06/14/2021   Paroxysmal atrial fibrillation with RVR 06/14/2021   Carotid stenosis 06/15/2020   Dyslipidemia 06/15/2020   Diabetic macular edema of right eye with proliferative retinopathy associated with type 2 diabetes mellitus 01/18/2020   Severe nonproliferative diabetic retinopathy of left eye, with macular edema, associated with type 2 diabetes mellitus 01/04/2020   Retinal hemorrhage of right eye 01/04/2020   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   Pseudoclaudication 11/15/2018   Essential hypertension 09/18/2018   S/P BKA (below knee amputation), right 09/18/2018   Amputation of toe of left foot 09/18/2018   Peripheral artery disease 09/18/2018   S/P carotid endarterectomy 09/18/2018   OSA not tolerating CPAP 09/18/2018   Chronic back pain 09/18/2018   Status post reversal of ileostomy 05/21/2018   Normocytic anemia 02/14/2018   Intra-abdominal abscess 11/04/2017   Chronic venous hypertension (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 12/11/2016   PAD (peripheral artery disease) 11/12/2016   Unilateral primary osteoarthritis, left knee 10/11/2016   History of right below knee amputation 07/12/2016   Infected diabetic left foot ulcer with chronic osteomyelitis    Diabetic foot infection 05/04/2016   Insulin dependent type 2 diabetes mellitus 12/16/2015   Amputated toe 10/21/2015   Cellulitis of left lower extremity 09/03/2015   Leg edema, left 09/03/2015   CAD (dz of distal, mid and proximal RCA with implantation of 3 overlapping drug-eluting stent,) 09/03/2015   Chronic diastolic heart failure, NYHA class 2 09/03/2015   Angina pectoris associated with type 2 diabetes mellitus  10/28/2014   Hyperlipidemia 08/25/2014   Limb pain 03/20/2013   DJD (degenerative joint disease) 09/25/2012   Migraine 09/25/2012   Neuropathy 09/25/2012   Restless legs syndrome (RLS) 09/25/2012   Chronic obstructive pulmonary disease, unspecified 04/25/2012   Unknown cause of morbidity or mortality 04/25/2012   Chronic total occlusion of artery of the extremities 04/08/2012   Onychomycosis 02/01/2012   Occlusion and stenosis of carotid artery without mention of cerebral infarction 06/12/2011   GERD (gastroesophageal reflux disease) 05/08/2011   Barrett's esophagus without dysplasia 05/08/2011   Former tobacco use 02/01/2011   Past Medical History:  Diagnosis Date   Carotid artery occlusion 11/10/10   LEFT CAROTID ENDARTERECTOMY   Chronic kidney disease    Complication of anesthesia    BP WENT UP AT [facility] "  COPD (chronic obstructive pulmonary disease)    pt denies this dx as of 06/01/20 - no inhaler    Diabetes mellitus without complication    Diverticulitis    Diverticulosis of colon (without mention of hemorrhage)    DJD (degenerative joint disease)    knees/hands/feet/back/neck   Fatty liver    Full dentures    GERD (gastroesophageal reflux disease)    H/O hiatal hernia    History of blood transfusion    with a past surical procedure per patient 06/01/20   Hyperlipidemia    Hypertension    Neuromuscular disorder    peripheral neuropathy   Non-pressure chronic ulcer of other part of left foot limited to breakdown of skin 11/12/2016   Osteomyelitis    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 Atherectomy left popliteal artery July 2012.    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 Before age 33   Hyperlipidemia Brother    Hypertension Brother    Hypertension Son    Arthritis Other        GRANDMOTHER   Hypertension Other        OTHER FAMILY MEMBERS    Past Surgical History:  Procedure Laterality Date   ABDOMINAL AORTOGRAM W/LOWER EXTREMITY N/A 06/23/2021   Procedure: ABDOMINAL AORTOGRAM W/LOWER EXTREMITY;  Surgeon: Elder Negus, MD;  Location: [facility];  Service: Cardiovascular;  Laterality: N/A;   AMPUTATION  11/05/2011   Procedure: AMPUTATION RAY;  Surgeon: Toni Arthurs, MD;  Location: [facility];  Service: Orthopedics;  Laterality: Right;  Amputation of Right 4&5th Toes   AMPUTATION Left 11/26/2012   Procedure: AMPUTATION RAY;  Surgeon: Toni Arthurs, MD;  Location: [facility] OR;  Service: Orthopedics;  Laterality: Left;  fourth ray amputation   AMPUTATION Right 08/27/2014   Procedure: Transmetatarsal Amputation;  Surgeon: Nadara Mustard, MD;  Location: [facility] OR;  Service: Orthopedics;  Laterality: Right;   AMPUTATION Right 01/14/2015   Procedure: AMPUTATION BELOW KNEE;  Surgeon: Nadara Mustard, MD;  Location: [facility] OR;  Service: Orthopedics;  Laterality: Right;   AMPUTATION Left 10/21/2015   Procedure: Left Foot 5th Ray Amputation;  Surgeon: Nadara Mustard, MD;  Location: [facility] OR;  Service: Orthopedics;  Laterality: Left;   AMPUTATION Left 07/04/2022   Procedure: LEFT BELOW KNEE AMPUTATION;  Surgeon: Nadara Mustard, MD;  Location: [facility] OR;  Service: Orthopedics;  Laterality: Left;   ANTERIOR FUSION CERVICAL SPINE  02/06/06   C4-5, C5-6, C6-7; SURGEON DR. MAX COHEN   AV FISTULA  PLACEMENT Left 06/02/2020   Procedure: ARTERIOVENOUS (AV) FISTULA CREATION LEFT;  Surgeon: Maeola Harman, MD;  Location: [facility] OR;  Service: Vascular;  Laterality: Left;   BACK SURGERY     x 3   BASCILIC VEIN TRANSPOSITION Left 07/21/2020   Procedure: LEFT UPPER ARM ATERIOVENOUS SUPERFISTULALIZATION;  Surgeon: Maeola Harman, MD;  Location: [facility] OR;  Service: Vascular;  Laterality: Left;   BELOW KNEE LEG AMPUTATION Right    CARDIAC CATHETERIZATION  10/31/04   2009   CAROTID ENDARTERECTOMY  11/10/10   CAROTID ENDARTERECTOMY Left 11/10/2010   Subtotal occlusion of left internal carotid artery with left hemispheric transient ischemic attacks.   CAROTID STENT     CARPAL TUNNEL RELEASE Right 10/21/2013   Procedure: RIGHT CARPAL TUNNEL RELEASE;  Surgeon: Nicki Reaper, MD;  Location: [facility];  Service: Orthopedics;  Laterality: Right;   CHOLECYSTECTOMY     COLON SURGERY     COLONOSCOPY     COLOSTOMY REVERSAL  05/21/2018   ileostomy reversal   CYSTOSCOPY WITH STENT PLACEMENT Bilateral 01/13/2018   Procedure: CYSTOSCOPY WITH BILATERAL URETERAL CATHETER PLACEMENT;  Surgeon: Crist Fat, MD;  Location: [facility];  Service: Urology;  Laterality: Bilateral;   ESOPHAGEAL MANOMETRY Bilateral 07/19/2014   Procedure: ESOPHAGEAL MANOMETRY (EM);  Surgeon: Beverley Fiedler, MD;  Location: [facility];  Service: Gastroenterology;  Laterality: Bilateral;   EYE SURGERY Bilateral 2020   cataract   FEMORAL ARTERY STENT     x6   FINGER SURGERY     FOOT SURGERY  04/25/2016    EXCISION BASE 5TH METATARSAL AND PARTIAL CUBOID LEFT FOOT   HERNIA REPAIR     LEFT INGUINAL AND UMBILICAL REPAIRS   HERNIA REPAIR     I & D EXTREMITY Left 04/25/2016   Procedure: EXCISION BASE 5TH METATARSAL AND PARTIAL CUBOID LEFT FOOT;  Surgeon: Nadara Mustard, MD;  Location: [facility];  Service: Orthopedics;  Laterality: Left;   ILEOSTOMY  01/13/2018   Procedure: ILEOSTOMY;  Surgeon: Berna Bue, MD;   Location: [facility];  Service: General;;   ILEOSTOMY CLOSURE N/A 05/21/2018   Procedure: ILEOSTOMY REVERSAL ERAS PATHWAY;  Surgeon: Berna Bue, MD;  Location: [facility];  Service: General;  Laterality: N/A;   IR RADIOLOGIST EVAL & MGMT  11/19/2017   IR RADIOLOGIST EVAL & MGMT  12/03/2017   IR RADIOLOGIST EVAL & MGMT  12/18/2017   JOINT REPLACEMENT Right 2001   Total knee   LAMINECTOMY     X 3 LUMBAR AND X 2 CERVICAL SPINE OPERATIONS   LAPAROSCOPIC CHOLECYSTECTOMY W/ CHOLANGIOGRAPHY  11/09/04   SURGEON DR. Caleen Essex   LEFT HEART CATH AND CORONARY ANGIOGRAPHY N/A 09/16/2018   Procedure: LEFT HEART CATH AND CORONARY ANGIOGRAPHY;  Surgeon: Elder Negus, MD;  [facility];  Service: Cardiovascular;  Laterality: N/A;   LEFT HEART CATHETERIZATION WITH CORONARY ANGIOGRAM N/A 10/29/2014   Procedure: LEFT HEART CATHETERIZATION WITH CORONARY ANGIOGRAM;  Surgeon: Pamella Pert, MD;  Location: [facility] CATH LAB;  Service: Cardiovascular;  Laterality: N/A;   LIGATION OF COMPETING BRANCHES OF ARTERIOVENOUS FISTULA Left 07/21/2020   Procedure: LIGATION OF COMPETING BRANCHES OF LEFT UPPER ARM ARTERIOVENOUS FISTULA;  Surgeon: Maeola Harman, MD;  Location: [facility] OR;  Service: Vascular;  Laterality: Left;   LOWER EXTREMITY ANGIOGRAM N/A 03/19/2012   Procedure: LOWER EXTREMITY ANGIOGRAM;  Surgeon: Nile Dear  Clifton James, MD;  Location: [facility];  Service: Cardiovascular;  Laterality: N/A;   LOWER EXTREMITY ANGIOGRAPHY N/A 06/20/2021   Procedure: LOWER EXTREMITY ANGIOGRAPHY;  Surgeon: Elder Negus, MD;  [facility]: [facility];  Service: Cardiovascular;  Laterality: N/A;   NECK SURGERY     PARTIAL COLECTOMY N/A 01/13/2018   Procedure: LAPAROSCOPIC ASSISTED   SIGMOID COLECTOMY ILEOSTOMY;  Surgeon: Berna Bue, MD;  Location: [facility];  Service: General;  Laterality: N/A;   PENILE PROSTHESIS IMPLANT  08/14/05   INFRAPUBIC INSERTION OF INFLATABLE PENILE PROSTHESIS; SURGEON  DR. Logan Bores   PENILE PROSTHESIS IMPLANT     PERCUTANEOUS CORONARY STENT INTERVENTION (PCI-S) Right 10/29/2014   Procedure: PERCUTANEOUS CORONARY STENT INTERVENTION (PCI-S);  Surgeon: Pamella Pert, MD;  Location: [facility] CATH LAB;  Service: Cardiovascular;  Laterality: Right;   PERIPHERAL VASCULAR INTERVENTION Left 06/23/2021   Procedure: PERIPHERAL VASCULAR INTERVENTION;  Surgeon: Elder Negus, MD;  [facility]: [facility];  Service: Cardiovascular;  Laterality: Left;   REMOVAL OF PENILE PROSTHESIS N/A 06/14/2021   Procedure: Removal of THREE piece inflatable penile prosthesis;  Surgeon: Crista Elliot, MD;  Location: [facility] OR;  Service: Urology;  Laterality: N/A;   SHOULDER ARTHROSCOPY     SPINE SURGERY     TOE AMPUTATION Left    TONSILLECTOMY     TOTAL KNEE ARTHROPLASTY  07/2002   RIGHT KNEE ; SURGEON  DR. [name] ALSO HAD ARTHROSCOPIC RIGHT KNEE IN  10/2001   TOTAL KNEE ARTHROPLASTY     ULNAR NERVE TRANSPOSITION Right 10/21/2013   Procedure: RIGHT ELBOW  ULNAR NERVE DECOMPRESSION;  Surgeon: Nicki Reaper, MD;  Location: [facility];  Service: Orthopedics;  Laterality: Right;   Social History   Occupational History   Occupation: Publishing rights manager: UNEMPLOYED  Tobacco Use   Smoking status: Former    Packs/day: 2.00    Years: 35.00    Additional pack years: 0.00    Total pack years: 70.00    Types: Cigarettes    Quit date: 10/28/2011    Years since quitting: 11.1   Smokeless tobacco: Never  Vaping Use   Vaping Use: Some days   Substances: Nicotine  Substance and Sexual Activity   Alcohol use: Not Currently    Comment: "not in a long time"   Drug use: Never   Sexual activity: Yes    Birth control/protection: Implant    Comment: penile implant

## 2022-12-27 DIAGNOSIS — E875 Hyperkalemia: Secondary | ICD-10-CM | POA: Diagnosis not present

## 2022-12-27 DIAGNOSIS — N186 End stage renal disease: Secondary | ICD-10-CM | POA: Diagnosis not present

## 2022-12-27 DIAGNOSIS — Z89512 Acquired absence of left leg below knee: Secondary | ICD-10-CM | POA: Diagnosis not present

## 2022-12-27 DIAGNOSIS — D631 Anemia in chronic kidney disease: Secondary | ICD-10-CM | POA: Diagnosis not present

## 2022-12-27 DIAGNOSIS — Z4781 Encounter for orthopedic aftercare following surgical amputation: Secondary | ICD-10-CM | POA: Diagnosis not present

## 2022-12-27 DIAGNOSIS — D509 Iron deficiency anemia, unspecified: Secondary | ICD-10-CM | POA: Diagnosis not present

## 2022-12-27 DIAGNOSIS — N2581 Secondary hyperparathyroidism of renal origin: Secondary | ICD-10-CM | POA: Diagnosis not present

## 2022-12-27 DIAGNOSIS — L299 Pruritus, unspecified: Secondary | ICD-10-CM | POA: Diagnosis not present

## 2022-12-27 DIAGNOSIS — Z992 Dependence on renal dialysis: Secondary | ICD-10-CM | POA: Diagnosis not present

## 2022-12-27 DIAGNOSIS — E1122 Type 2 diabetes mellitus with diabetic chronic kidney disease: Secondary | ICD-10-CM | POA: Diagnosis not present

## 2022-12-29 DIAGNOSIS — Z992 Dependence on renal dialysis: Secondary | ICD-10-CM | POA: Diagnosis not present

## 2022-12-29 DIAGNOSIS — L299 Pruritus, unspecified: Secondary | ICD-10-CM | POA: Diagnosis not present

## 2022-12-29 DIAGNOSIS — E875 Hyperkalemia: Secondary | ICD-10-CM | POA: Diagnosis not present

## 2022-12-29 DIAGNOSIS — D631 Anemia in chronic kidney disease: Secondary | ICD-10-CM | POA: Diagnosis not present

## 2022-12-29 DIAGNOSIS — D509 Iron deficiency anemia, unspecified: Secondary | ICD-10-CM | POA: Diagnosis not present

## 2022-12-29 DIAGNOSIS — N186 End stage renal disease: Secondary | ICD-10-CM | POA: Diagnosis not present

## 2022-12-29 DIAGNOSIS — E1122 Type 2 diabetes mellitus with diabetic chronic kidney disease: Secondary | ICD-10-CM | POA: Diagnosis not present

## 2022-12-29 DIAGNOSIS — N2581 Secondary hyperparathyroidism of renal origin: Secondary | ICD-10-CM | POA: Diagnosis not present

## 2022-12-31 DIAGNOSIS — Z794 Long term (current) use of insulin: Secondary | ICD-10-CM | POA: Diagnosis not present

## 2022-12-31 DIAGNOSIS — E113511 Type 2 diabetes mellitus with proliferative diabetic retinopathy with macular edema, right eye: Secondary | ICD-10-CM | POA: Diagnosis not present

## 2022-12-31 DIAGNOSIS — E113512 Type 2 diabetes mellitus with proliferative diabetic retinopathy with macular edema, left eye: Secondary | ICD-10-CM | POA: Diagnosis not present

## 2022-12-31 LAB — HM DIABETES EYE EXAM

## 2023-01-03 ENCOUNTER — Ambulatory Visit: Payer: Medicare HMO | Admitting: Cardiology

## 2023-01-03 DIAGNOSIS — D509 Iron deficiency anemia, unspecified: Secondary | ICD-10-CM | POA: Diagnosis not present

## 2023-01-03 DIAGNOSIS — E1122 Type 2 diabetes mellitus with diabetic chronic kidney disease: Secondary | ICD-10-CM | POA: Diagnosis not present

## 2023-01-03 DIAGNOSIS — N186 End stage renal disease: Secondary | ICD-10-CM | POA: Diagnosis not present

## 2023-01-03 DIAGNOSIS — N2581 Secondary hyperparathyroidism of renal origin: Secondary | ICD-10-CM | POA: Diagnosis not present

## 2023-01-03 DIAGNOSIS — Z992 Dependence on renal dialysis: Secondary | ICD-10-CM | POA: Diagnosis not present

## 2023-01-03 DIAGNOSIS — L299 Pruritus, unspecified: Secondary | ICD-10-CM | POA: Diagnosis not present

## 2023-01-03 DIAGNOSIS — D631 Anemia in chronic kidney disease: Secondary | ICD-10-CM | POA: Diagnosis not present

## 2023-01-03 DIAGNOSIS — E875 Hyperkalemia: Secondary | ICD-10-CM | POA: Diagnosis not present

## 2023-01-05 DIAGNOSIS — N186 End stage renal disease: Secondary | ICD-10-CM | POA: Diagnosis not present

## 2023-01-05 DIAGNOSIS — D509 Iron deficiency anemia, unspecified: Secondary | ICD-10-CM | POA: Diagnosis not present

## 2023-01-05 DIAGNOSIS — Z992 Dependence on renal dialysis: Secondary | ICD-10-CM | POA: Diagnosis not present

## 2023-01-05 DIAGNOSIS — N2581 Secondary hyperparathyroidism of renal origin: Secondary | ICD-10-CM | POA: Diagnosis not present

## 2023-01-05 DIAGNOSIS — L299 Pruritus, unspecified: Secondary | ICD-10-CM | POA: Diagnosis not present

## 2023-01-05 DIAGNOSIS — E875 Hyperkalemia: Secondary | ICD-10-CM | POA: Diagnosis not present

## 2023-01-05 DIAGNOSIS — E1122 Type 2 diabetes mellitus with diabetic chronic kidney disease: Secondary | ICD-10-CM | POA: Diagnosis not present

## 2023-01-05 DIAGNOSIS — D631 Anemia in chronic kidney disease: Secondary | ICD-10-CM | POA: Diagnosis not present

## 2023-01-07 DIAGNOSIS — M1990 Unspecified osteoarthritis, unspecified site: Secondary | ICD-10-CM | POA: Diagnosis not present

## 2023-01-07 DIAGNOSIS — G8929 Other chronic pain: Secondary | ICD-10-CM | POA: Diagnosis not present

## 2023-01-08 DIAGNOSIS — N186 End stage renal disease: Secondary | ICD-10-CM | POA: Diagnosis not present

## 2023-01-08 DIAGNOSIS — Z992 Dependence on renal dialysis: Secondary | ICD-10-CM | POA: Diagnosis not present

## 2023-01-08 DIAGNOSIS — E1122 Type 2 diabetes mellitus with diabetic chronic kidney disease: Secondary | ICD-10-CM | POA: Diagnosis not present

## 2023-01-09 ENCOUNTER — Ambulatory Visit: Payer: Medicare HMO | Admitting: Cardiology

## 2023-01-09 DIAGNOSIS — M6259 Muscle wasting and atrophy, not elsewhere classified, multiple sites: Secondary | ICD-10-CM | POA: Diagnosis not present

## 2023-01-09 DIAGNOSIS — Y835 Amputation of limb(s) as the cause of abnormal reaction of the patient, or of later complication, without mention of misadventure at the time of the procedure: Secondary | ICD-10-CM | POA: Diagnosis not present

## 2023-01-09 DIAGNOSIS — Z4781 Encounter for orthopedic aftercare following surgical amputation: Secondary | ICD-10-CM | POA: Diagnosis not present

## 2023-01-09 DIAGNOSIS — Z992 Dependence on renal dialysis: Secondary | ICD-10-CM | POA: Diagnosis not present

## 2023-01-09 NOTE — Progress Notes (Signed)
NO SHOW     Primary Physician/Referring:  Lonie Peak, PA-C  Patient ID: [name], male    DOB: 1956/04/05, 67 y.o.   MRN: [id number]  No chief complaint on file.  HPI:    [name]  is a 67 y.o. Caucasian male patient with severe peripheral arterial disease and has history of bilateral below-knee amputation, multiple peripheral interventions in the past, uncontrolled diabetes mellitus, ESRD, severe diabetic retinopathy, coronary artery disease angioplasty to the right coronary artery on 10/29/2014, right carotid endarterectomy in 2012 , OSA Unable to tolerate CPAP, chronic back pain and neck and back surgery in past and on chronic pain medications, paroxysmal atrial fibrillation on Eliquis.  His latest admission to the hospital on 11/19/2022 for cellulitis involving the left stump treated with antibiotics.   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  He is presently in palliative care. Uunfortunately continues to smoke e-cigarettes, but has reduced this significantly.   Social History   Tobacco Use   Smoking status: Former    Packs/day: 2.00    Years: 35.00    Additional pack years: 0.00    Total pack years: 70.00    Types: Cigarettes    Quit date: 10/28/2011    Years since quitting: 11.2   Smokeless tobacco: Never  Substance Use Topics   Alcohol use: Not Currently    Comment: "not in a long time"   Marital Status: Married   ROS  Review of Systems  Cardiovascular:  Negative for chest pain, dyspnea on exertion and palpitations.  Neurological:  Negative for dizziness.   Objective      11/23/2022   11:30 AM 11/23/2022    7:24 AM 11/23/2022    4:13 AM  Vitals with BMI  Systolic 181 162 409  Diastolic 70 46 74  Pulse 91 79 85    There were no vitals taken for this visit. There is no height or weight on file to calculate BMI.   Physical Exam Vitals reviewed.  Constitutional:      Appearance: He is obese.      Comments: Well built and moderately obese in no acute distress  Neck:     Vascular: Carotid bruit (bilateral) present. No JVD.  Cardiovascular:     Rate and Rhythm: Normal rate and regular rhythm.     Pulses:          Femoral pulses are 1+ on the right side with bruit and 1+ on the left side with bruit.      Popliteal pulses are 0 on the right side and 0 on the left side.        Right dorsalis pedis pulse not accessible and left dorsalis pedis pulse not accessible.     Heart sounds: Normal heart sounds. No murmur heard.    No gallop.     Comments: Left brachial artery AV Shunt noted Pulmonary:     Effort: Pulmonary effort is normal.     Breath sounds: Normal breath sounds.  Abdominal:     General: Abdomen is flat.     Palpations: Abdomen is soft.  Musculoskeletal:        General: Deformity (bilataral BKA) present.     Cervical back: Neck supple.    Laboratory examination:   Recent Labs    11/20/22 0437 11/21/22 1040 11/22/22 0309  NA 134* 136 135  K 3.8 3.9 3.5  CL 96* 96* 95*  CO2 23 27 25  GLUCOSE 241* 216* 160*  BUN 49* 33* 43*  CREATININE 7.12* 5.17* 5.99*  CALCIUM 7.9* 8.6* 8.7*  GFRNONAA 8* 11* 10*       Latest Ref Rng & Units 11/22/2022    3:09 AM 11/21/2022   10:40 AM 11/20/2022    4:37 AM  CMP  Glucose 70 - 99 mg/dL 284  132  440   BUN 8 - 23 mg/dL 43  33  49   Creatinine 0.61 - 1.24 mg/dL 1.02  7.25  3.66   Sodium 135 - 145 mmol/L 135  136  134   Potassium 3.5 - 5.1 mmol/L 3.5  3.9  3.8   Chloride 98 - 111 mmol/L 95  96  96   CO2 22 - 32 mmol/L 25  27  23    Calcium 8.9 - 10.3 mg/dL 8.7  8.6  7.9       Latest Ref Rng & Units 11/21/2022   10:40 AM 11/20/2022    4:37 AM 11/19/2022    8:58 PM  CBC  WBC 4.0 - 10.5 K/uL 7.5  10.1  10.6   Hemoglobin 13.0 - 17.0 g/dL 44.0  9.5  34.7   Hematocrit 39.0 - 52.0 % 31.6  29.5  33.6   Platelets 150 - 400 K/uL 194  169  202    Lipid Panel     Component Value Date/Time   CHOL 117 05/30/2020 1646   TRIG 133  05/30/2020 1646   HDL 24 (L) 05/30/2020 1646   LDLCALC 69 05/30/2020 1646   HEMOGLOBIN A1C Lab Results  Component Value Date   HGBA1C 7.0 (H) 11/20/2022   MPG 154 11/20/2022   Cardiac Studies:   Coronary angiogram 09/17/18: Widely patent stent stents. Normal LVEF. Coronary Angiogram 10/29/2014: CAD s/p stenting of the distal, mid and proximal RCA with implantation of 3 overlapping drug-eluting stent, from distal to proximal 2.5 x 38 mm, 2.75 x 38 mm and a 2.75 x 20 mm Promus premier DES.   Carotid artery duplex  06/09/2019:  Minimal stenosis in bilateral ICA of 1-15%. Heterogeneous plaque. Stenosis  in the right external carotid artery (<50%).  Left carotid endarterectomy site is patent.  Right vertebral artery flow is not well visualized. Antegrade left  vertebral artery flow.  No significant change from 03/01/2017. Follow up studies when clinically  Indicated.  Echocardiogram 06/25/2021: 1. Left ventricular ejection fraction, by estimation, is 55 to 60%. The left ventricle has normal function. The left ventricle has no regional wall motion abnormalities. There is mild concentric left ventricular hypertrophy. Left ventricular diastolic parameters are indeterminate.   2. Right ventricular systolic function is normal. The right ventricular size is normal.   3. Left atrial size was moderately dilated.   4. The mitral valve is normal in structure. No evidence of mitral valve regurgitation. Moderate mitral annular calcification.   5. The aortic valve is tricuspid. Aortic valve regurgitation is not visualized. Mild to moderate aortic valve sclerosis/calcification is present, without any evidence of aortic stenosis.  EKG:    EKG 10/29/2022: Sinus rhythm with first-degree AV block at rate of 68 beats minute, left axis deviation, left anterior fascicular block.  Right bundle branch block.  Trifascicular block.  Compared to 04/27/2022, first-degree AV block is new.  06/12/2021: Atypical atrial  flutter with RVR at the rate of 106 bpm, left axis deviation, left anterior fascicular block.  Right bundle branch block.  Nonspecific T abnormality.   Allergies   Allergies  Allergen Reactions  Contrast Media [Iodinated Contrast Media] Shortness Of Breath and Other (See Comments)    Difficulty breathing and altered mental status     Ivp Dye [Iodinated Contrast Media] Anaphylaxis, Shortness Of Breath and Other (See Comments)    Breathing problems, altered mental state    Adhesive [Tape] Rash and Other (See Comments)    Rash after 1 day of use Do not leave on longer then 3 days with out be changed   Latex Rash and Other (See Comments)    A severe rash appears after the first 24 hours of being placed    Current Outpatient Medications:    amiodarone (PACERONE) 200 MG tablet, Take 1 tablet (200 mg total) by mouth daily. (Patient taking differently: Take 100 mg by mouth daily.), Disp: 100 tablet, Rfl: 1   apixaban (ELIQUIS) 5 MG TABS tablet, Take 1 tablet (5 mg total) by mouth 2 (two) times daily., Disp: 60 tablet, Rfl: 1   atorvastatin (LIPITOR) 40 MG tablet, Take 40 mg by mouth at bedtime. , Disp: , Rfl:    B Complex-C-Zn-Folic Acid (DIALYVITE/ZINC) TABS, Take 1 tablet by mouth Every Tuesday,Thursday,and Saturday with dialysis., Disp: , Rfl:    famotidine (PEPCID) 20 MG tablet, Take 20 mg by mouth every other day., Disp: , Rfl:    fentaNYL (DURAGESIC) 50 MCG/HR, Place 1 patch onto the skin every 3 (three) days., Disp: 5 patch, Rfl: 0   folic acid (FOLVITE) 1 MG tablet, Take 1 mg by mouth daily., Disp: , Rfl:    Glucosamine HCl (GLUCOSAMINE PO), Take 500 mg by mouth 2 (two) times daily., Disp: , Rfl:    insulin aspart (NOVOLOG FLEXPEN) 100 UNIT/ML FlexPen, Inject 0-12 Units into the skin See admin instructions. Inject as per sliding scale: If 0 - 69 = 0 initiate hypoglycemia orders;  70 - 150 = 0;  151 - 200 = 2;  201 - 250 = 4;  251 - 300 = 6;  301 - 350 = 8;  351 - 400 = 10;  401 - 450 =  12.  If >400, give 12 units, recheck in 1 hour. If still >400, call MD/NP for further orders. Subcutaneously with meals in addition to scheduled 4 unites with meals., Disp: , Rfl:    insulin glargine (LANTUS) 100 UNIT/ML injection, Inject 0.2 mLs (20 Units total) into the skin at bedtime., Disp: 10 mL, Rfl: 0   lidocaine-prilocaine (EMLA) cream, Apply 1 application topically See admin instructions. Prior to Dialysis days Tuesday,Thursday and saturday, Disp: , Rfl:    loratadine (CLARITIN) 10 MG tablet, Take 10 mg by mouth daily., Disp: , Rfl:    metoCLOPramide (REGLAN) 5 MG tablet, Take 1 tablet (5 mg total) by mouth 4 (four) times daily -  before meals and at bedtime. (Patient taking differently: Take 5 mg by mouth in the morning, at noon, in the evening, and at bedtime.), Disp: 360 tablet, Rfl: 1   midodrine (PROAMATINE) 10 MG tablet, Take 1 tablet (10 mg total) by mouth every other day. One hour before dialysis (Patient taking differently: Take 10 mg by mouth Every Tuesday,Thursday,and Saturday with dialysis. One hour before dialysis), Disp: 30 tablet, Rfl: 3   NEEDLE, REUSABLE, 22 G 22G X 1-1/2" MISC, 1 Units by Does not apply route as directed. For B12 IM inj, Disp: 10 each, Rfl: 0   nitroGLYCERIN (NITROSTAT) 0.4 MG SL tablet, Place 0.4 mg under the tongue every 5 (five) minutes as needed for chest pain., Disp: , Rfl:  NOVOLOG FLEXPEN 100 UNIT/ML FlexPen, Inject 4 Units into the skin 3 (three) times daily. (Patient taking differently: Inject 4 Units into the skin 3 (three) times daily. Give in addition to sliding scale insulin with meals.), Disp: 15 mL, Rfl: 11   omega-3 acid ethyl esters (LOVAZA) 1 g capsule, Take 2 g by mouth 2 (two) times daily., Disp: , Rfl:    pantoprazole (PROTONIX) 40 MG tablet, Take 1 tablet (40 mg total) by mouth 2 (two) times daily for 30 days, THEN 1 tablet (40 mg total) daily. (Patient taking differently:  1 tablet (40 mg total) twice daily.), Disp: 120 tablet, Rfl: 0    Polyvinyl Alcohol-Povidone PF 1.4-0.6 % SOLN, Place 1 drop into both eyes 2 (two) times daily as needed (dry eyes). , Disp: , Rfl:    pregabalin (LYRICA) 100 MG capsule, Take 1 capsule (100 mg total) by mouth 2 (two) times daily for 5 days., Disp: 10 capsule, Rfl: 0   rOPINIRole (REQUIP) 2 MG tablet, Take 2 mg by mouth at bedtime., Disp: , Rfl:    senna-docusate (SENOKOT-S) 8.6-50 MG tablet, Take 2 tablets by mouth at bedtime., Disp: , Rfl:    Syringe/Needle, Disp, (SYRINGE 3CC/22GX1-1/2") 22G X 1-1/2" 3 ML MISC, 1 Syringe by Does not apply route as directed. For b12 IM inj, Disp: 10 each, Rfl: 0   tamsulosin (FLOMAX) 0.4 MG CAPS capsule, Take 0.4 mg by mouth daily., Disp: , Rfl:    Assessment     ICD-10-CM   1. Coronary artery disease of native artery of native heart with stable angina pectoris (HCC)  I25.118     2. Orthostatic hypotension  I95.1     3. Supine hypertension  I10        No orders of the defined types were placed in this encounter.   There are no discontinued medications. CHA2DS2-VASc Score is 4.  Yearly risk of stroke: 5% (A, HTN, Vasc Dz).  Score of 1=0.6; 2=2.2; 3=3.2; 4=4.8; 5=7.2; 6=9.8; 7=>9.8) -(CHF; HTN; vasc disease DM,  Male = 1; Age <65 =0; 65-74 = 1,  >75 =2; stroke/embolism= 2).     Recommendations:   [name]  is a 67 y.o. Caucasian male patient with severe peripheral arterial disease and has history of bilateral below-knee amputation, multiple peripheral interventions in the past, uncontrolled diabetes mellitus, ESRD, severe diabetic retinopathy, coronary artery disease angioplasty to the right coronary artery on 10/29/2014, right carotid endarterectomy in 2012 , OSA Unable to tolerate CPAP, chronic back pain and neck and back surgery in past and on chronic pain medications, paroxysmal atrial fibrillation on Eliquis.  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  He is presently in palliative care.  Uunfortunately continues to smoke e-cigarettes, but has reduced this significantly.  1. Paroxysmal atrial fibrillation with RVR (HCC) He is maintaining sinus rhythm, he is also tolerating anticoagulation without bleeding diathesis.  He is going through dialysis without any difficulty and is maintaining blood pressure and has been using midodrine as needed.  Presently on amiodarone 200 mg p.o. twice daily.  Will reduce the dose to 200 mg daily.  2. Coronary artery disease involving native coronary artery of native heart without angina pectoris No recurrence of angina pectoris.  No change in the EKG.  Except for development of first-degree AV block which is new.  3. Orthostatic hypotension Patient is presently doing well and has not had any dizziness or syncope, his physical activity is very  limited and is presently in a rehab facility which he plans to go back home.  4. ESRD on hemodialysis (HCC) On hemodialysis without any complications and has not had any difficulty with hemodialysis either.  I will see him back in 6 months for follow-up.  Patient has developed severe arthritis involving his hands, I prescribed Ultram to be used on a as needed basis.  Patient is already on high doses of fentanyl.  I do not want to use Neurontin in view of orthostatic hypotension.    Yates Decamp, MD, [facility] 01/09/2023, 7:33 AM Office: [phone] Fax: [phone] Pager: [phone]

## 2023-01-10 DIAGNOSIS — Y835 Amputation of limb(s) as the cause of abnormal reaction of the patient, or of later complication, without mention of misadventure at the time of the procedure: Secondary | ICD-10-CM | POA: Diagnosis not present

## 2023-01-10 DIAGNOSIS — Z992 Dependence on renal dialysis: Secondary | ICD-10-CM | POA: Diagnosis not present

## 2023-01-10 DIAGNOSIS — Z4781 Encounter for orthopedic aftercare following surgical amputation: Secondary | ICD-10-CM | POA: Diagnosis not present

## 2023-01-10 DIAGNOSIS — N2581 Secondary hyperparathyroidism of renal origin: Secondary | ICD-10-CM | POA: Diagnosis not present

## 2023-01-10 DIAGNOSIS — D509 Iron deficiency anemia, unspecified: Secondary | ICD-10-CM | POA: Diagnosis not present

## 2023-01-10 DIAGNOSIS — N186 End stage renal disease: Secondary | ICD-10-CM | POA: Diagnosis not present

## 2023-01-10 DIAGNOSIS — M6259 Muscle wasting and atrophy, not elsewhere classified, multiple sites: Secondary | ICD-10-CM | POA: Diagnosis not present

## 2023-01-10 DIAGNOSIS — E875 Hyperkalemia: Secondary | ICD-10-CM | POA: Diagnosis not present

## 2023-01-10 DIAGNOSIS — D631 Anemia in chronic kidney disease: Secondary | ICD-10-CM | POA: Diagnosis not present

## 2023-01-11 DIAGNOSIS — Y835 Amputation of limb(s) as the cause of abnormal reaction of the patient, or of later complication, without mention of misadventure at the time of the procedure: Secondary | ICD-10-CM | POA: Diagnosis not present

## 2023-01-11 DIAGNOSIS — Z992 Dependence on renal dialysis: Secondary | ICD-10-CM | POA: Diagnosis not present

## 2023-01-11 DIAGNOSIS — M6259 Muscle wasting and atrophy, not elsewhere classified, multiple sites: Secondary | ICD-10-CM | POA: Diagnosis not present

## 2023-01-11 DIAGNOSIS — N186 End stage renal disease: Secondary | ICD-10-CM | POA: Diagnosis not present

## 2023-01-11 DIAGNOSIS — Z91119 Patient's noncompliance with dietary regimen due to unspecified reason: Secondary | ICD-10-CM | POA: Diagnosis not present

## 2023-01-11 DIAGNOSIS — Z4781 Encounter for orthopedic aftercare following surgical amputation: Secondary | ICD-10-CM | POA: Diagnosis not present

## 2023-01-12 DIAGNOSIS — D631 Anemia in chronic kidney disease: Secondary | ICD-10-CM | POA: Diagnosis not present

## 2023-01-12 DIAGNOSIS — Z992 Dependence on renal dialysis: Secondary | ICD-10-CM | POA: Diagnosis not present

## 2023-01-12 DIAGNOSIS — Y835 Amputation of limb(s) as the cause of abnormal reaction of the patient, or of later complication, without mention of misadventure at the time of the procedure: Secondary | ICD-10-CM | POA: Diagnosis not present

## 2023-01-12 DIAGNOSIS — M6259 Muscle wasting and atrophy, not elsewhere classified, multiple sites: Secondary | ICD-10-CM | POA: Diagnosis not present

## 2023-01-12 DIAGNOSIS — Z4781 Encounter for orthopedic aftercare following surgical amputation: Secondary | ICD-10-CM | POA: Diagnosis not present

## 2023-01-12 DIAGNOSIS — N186 End stage renal disease: Secondary | ICD-10-CM | POA: Diagnosis not present

## 2023-01-12 DIAGNOSIS — N2581 Secondary hyperparathyroidism of renal origin: Secondary | ICD-10-CM | POA: Diagnosis not present

## 2023-01-12 DIAGNOSIS — D509 Iron deficiency anemia, unspecified: Secondary | ICD-10-CM | POA: Diagnosis not present

## 2023-01-12 DIAGNOSIS — E875 Hyperkalemia: Secondary | ICD-10-CM | POA: Diagnosis not present

## 2023-01-14 ENCOUNTER — Non-Acute Institutional Stay: Payer: Medicare HMO | Admitting: Hospice

## 2023-01-14 DIAGNOSIS — I482 Chronic atrial fibrillation, unspecified: Secondary | ICD-10-CM | POA: Diagnosis not present

## 2023-01-14 DIAGNOSIS — Z89512 Acquired absence of left leg below knee: Secondary | ICD-10-CM | POA: Diagnosis not present

## 2023-01-14 DIAGNOSIS — R531 Weakness: Secondary | ICD-10-CM

## 2023-01-14 DIAGNOSIS — N186 End stage renal disease: Secondary | ICD-10-CM

## 2023-01-14 DIAGNOSIS — R52 Pain, unspecified: Secondary | ICD-10-CM

## 2023-01-14 DIAGNOSIS — Z992 Dependence on renal dialysis: Secondary | ICD-10-CM

## 2023-01-14 DIAGNOSIS — N319 Neuromuscular dysfunction of bladder, unspecified: Secondary | ICD-10-CM | POA: Diagnosis not present

## 2023-01-14 DIAGNOSIS — Z4781 Encounter for orthopedic aftercare following surgical amputation: Secondary | ICD-10-CM | POA: Diagnosis not present

## 2023-01-14 DIAGNOSIS — K5901 Slow transit constipation: Secondary | ICD-10-CM

## 2023-01-14 DIAGNOSIS — Z515 Encounter for palliative care: Secondary | ICD-10-CM

## 2023-01-14 NOTE — Progress Notes (Signed)
Therapist, nutritional Palliative Care Consult Note Telephone: [phone]  Fax: [phone]     PATIENT NAME: [name] [address] [place] [address]   [phone] (home)  DOB: 1955/09/30 MRN: [id number] PRIMARY CARE PROVIDER:    Arlyss Queen,  [address] [place] [address] [phone]  REFERRING PROVIDER:   Lonie Peak, PA-C [address] [place],  [place] [address] [phone]  RESPONSIBLE PARTY:   Self Contact Information     Name Relation Home Work Mobile   Chayne, Bleau Spouse  [phone]   Maggie, Krippner   [phone]        I met face to face with patient in the facility. Palliative Care was asked to follow this patient by consultation request of  Lonie Peak, PA-C to address advance care planning and complex medical decision making.  Visit consisted of counseling and education dealing with the complex and emotionally intense issues of symptom management and palliative care in the setting of serious and potentially life-threatening illness. Palliative care team will continue to support patient, patient's family, and medical team.  ASSESSMENT AND PLAN / RECOMMENDATIONS:  Goals of Care: Goals include to maximize quality of life and symptom management.  CODE STATUS: Full Code  Symptom Management/Plan: ESRD-managed with hemodialysis.  Continue dialysis Tuesday/Thursday/Saturday, currently well tolerated. Continue dialyvite/zinc.   Generalized weakness/mobility:  PT/OT resumed, was suspended due to cellulitis on left leg stump for which he was hospitalized 3/11 - 11/23/2022, has completed antibiotics. PT/OT i is ongoing for strengthening of upper and lower extremities, trending to ambulate with prosthesis will follow.  Follow up with Ortho as planned.   Pain-generalized.  Managed with Fentanyl, Percocet and Lyrica as ordered.  Continue to monitor for respiratory distress/advised  reactions, and unalleviated pain. Constipation: Managed with Senna Docusate Sodium. Continue bowel regimen to prevent constipation.  Consider plan to wean with risk of decompensation. Follow up Palliative Care Visit: Palliative care will continue to follow for complex medical decision making, advance care planning, and clarification of goals. Return prn.   PPS: 40%  HOSPICE ELIGIBILITY/DIAGNOSIS: TBD  Chief Complaint: Palliative Medicine follow up visit.   HISTORY OF PRESENT ILLNESS:  [name] is a 67 y.o. year old male  with multiple morbidities requiring close monitoring/management with high risk of complications and morbidity: ESRD on hemodialysis Tuesday-Thursday-Saturday, chronic diastolic CHF, paroxysmal atrial fibrillation, type 2 diabetes, hypertension, hyperlipidemia, CAD, hx of PCI and stent, severe PAD, bilateral BKA, chronic pain, sinus bradycardia, anemia, chronic pain, vascular wound/left foot pain, hx of osteomyelitis. Chronic left foot vascular wound-receiving HH SN for wound care; follows up with Dr. Lajoyce Corners monthly.  Patient denies pain/discomfort, reports current pain regimen is effective.   History obtained from review of EMR, discussion with primary team, and interview with family, facility staff/caregiver and/or Mr. Doroteo Glassman.  I reviewed available labs, medications, imaging, studies and related documents from the EMR.  Records reviewed and summarized above.  I spent  35 minutes providing this consultation; this includes time spent with patient/family, chart review and documentation. More than 50% of the time in this consultation was spent on counseling and coordinating communication. Thank you for the opportunity to participate in the care of [name].  Please call our office at [phone] if we can be of additional assistance.   Note: Portions of this note were generated with Scientist, clinical (histocompatibility and immunogenetics). Dictation errors may occur despite best attempts at  proofreading.   Rosaura Carpenter, NP

## 2023-01-15 DIAGNOSIS — Z992 Dependence on renal dialysis: Secondary | ICD-10-CM | POA: Diagnosis not present

## 2023-01-15 DIAGNOSIS — D509 Iron deficiency anemia, unspecified: Secondary | ICD-10-CM | POA: Diagnosis not present

## 2023-01-15 DIAGNOSIS — N2581 Secondary hyperparathyroidism of renal origin: Secondary | ICD-10-CM | POA: Diagnosis not present

## 2023-01-15 DIAGNOSIS — Z4781 Encounter for orthopedic aftercare following surgical amputation: Secondary | ICD-10-CM | POA: Diagnosis not present

## 2023-01-15 DIAGNOSIS — I4510 Unspecified right bundle-branch block: Secondary | ICD-10-CM | POA: Diagnosis not present

## 2023-01-15 DIAGNOSIS — M6259 Muscle wasting and atrophy, not elsewhere classified, multiple sites: Secondary | ICD-10-CM | POA: Diagnosis not present

## 2023-01-15 DIAGNOSIS — R0902 Hypoxemia: Secondary | ICD-10-CM | POA: Diagnosis not present

## 2023-01-15 DIAGNOSIS — Y835 Amputation of limb(s) as the cause of abnormal reaction of the patient, or of later complication, without mention of misadventure at the time of the procedure: Secondary | ICD-10-CM | POA: Diagnosis not present

## 2023-01-15 DIAGNOSIS — E875 Hyperkalemia: Secondary | ICD-10-CM | POA: Diagnosis not present

## 2023-01-15 DIAGNOSIS — Z743 Need for continuous supervision: Secondary | ICD-10-CM | POA: Diagnosis not present

## 2023-01-15 DIAGNOSIS — N186 End stage renal disease: Secondary | ICD-10-CM | POA: Diagnosis not present

## 2023-01-15 DIAGNOSIS — R0789 Other chest pain: Secondary | ICD-10-CM | POA: Diagnosis not present

## 2023-01-15 DIAGNOSIS — D631 Anemia in chronic kidney disease: Secondary | ICD-10-CM | POA: Diagnosis not present

## 2023-01-15 DIAGNOSIS — I10 Essential (primary) hypertension: Secondary | ICD-10-CM | POA: Diagnosis not present

## 2023-01-16 DIAGNOSIS — Z992 Dependence on renal dialysis: Secondary | ICD-10-CM | POA: Diagnosis not present

## 2023-01-16 DIAGNOSIS — Z4781 Encounter for orthopedic aftercare following surgical amputation: Secondary | ICD-10-CM | POA: Diagnosis not present

## 2023-01-16 DIAGNOSIS — M6259 Muscle wasting and atrophy, not elsewhere classified, multiple sites: Secondary | ICD-10-CM | POA: Diagnosis not present

## 2023-01-16 DIAGNOSIS — Y835 Amputation of limb(s) as the cause of abnormal reaction of the patient, or of later complication, without mention of misadventure at the time of the procedure: Secondary | ICD-10-CM | POA: Diagnosis not present

## 2023-01-17 ENCOUNTER — Telehealth: Payer: Self-pay

## 2023-01-17 DIAGNOSIS — M6259 Muscle wasting and atrophy, not elsewhere classified, multiple sites: Secondary | ICD-10-CM | POA: Diagnosis not present

## 2023-01-17 DIAGNOSIS — D509 Iron deficiency anemia, unspecified: Secondary | ICD-10-CM | POA: Diagnosis not present

## 2023-01-17 DIAGNOSIS — N2581 Secondary hyperparathyroidism of renal origin: Secondary | ICD-10-CM | POA: Diagnosis not present

## 2023-01-17 DIAGNOSIS — E875 Hyperkalemia: Secondary | ICD-10-CM | POA: Diagnosis not present

## 2023-01-17 DIAGNOSIS — Z4781 Encounter for orthopedic aftercare following surgical amputation: Secondary | ICD-10-CM | POA: Diagnosis not present

## 2023-01-17 DIAGNOSIS — N186 End stage renal disease: Secondary | ICD-10-CM | POA: Diagnosis not present

## 2023-01-17 DIAGNOSIS — Y835 Amputation of limb(s) as the cause of abnormal reaction of the patient, or of later complication, without mention of misadventure at the time of the procedure: Secondary | ICD-10-CM | POA: Diagnosis not present

## 2023-01-17 DIAGNOSIS — D631 Anemia in chronic kidney disease: Secondary | ICD-10-CM | POA: Diagnosis not present

## 2023-01-17 DIAGNOSIS — Z992 Dependence on renal dialysis: Secondary | ICD-10-CM | POA: Diagnosis not present

## 2023-01-17 NOTE — Telephone Encounter (Signed)
Patient wife called and stated that patient has been having chest pains and has taken his Nitroglycerin, the last one was Tuesday 01/15/23 and the pain runs down the middle of his chest, and will last about 3 minutes. Today he was having chest pain and took two Nitroglycerin tablets. EMS was called to the facility and noted BP to be SBP 220. EMS advised to go to the ER but patient did not want to. Wife is concerned what to do in the meantime, since she moved her appointment in office to Monday. Patient is currently in na dialysis treatment and Nurse at dialysis told her that while listening to his heart, she heard a swishing sound.   I advised that if the patient continues to have chest pain that is not alleviated with Nitroglycerin and/or blood pressures remain under controlled to please go to the ED.

## 2023-01-17 NOTE — Telephone Encounter (Signed)
Agree, he missed his appointment yesterday

## 2023-01-18 DIAGNOSIS — M6259 Muscle wasting and atrophy, not elsewhere classified, multiple sites: Secondary | ICD-10-CM | POA: Diagnosis not present

## 2023-01-18 DIAGNOSIS — Z4781 Encounter for orthopedic aftercare following surgical amputation: Secondary | ICD-10-CM | POA: Diagnosis not present

## 2023-01-18 DIAGNOSIS — Z992 Dependence on renal dialysis: Secondary | ICD-10-CM | POA: Diagnosis not present

## 2023-01-18 DIAGNOSIS — Y835 Amputation of limb(s) as the cause of abnormal reaction of the patient, or of later complication, without mention of misadventure at the time of the procedure: Secondary | ICD-10-CM | POA: Diagnosis not present

## 2023-01-19 DIAGNOSIS — N2581 Secondary hyperparathyroidism of renal origin: Secondary | ICD-10-CM | POA: Diagnosis not present

## 2023-01-19 DIAGNOSIS — D631 Anemia in chronic kidney disease: Secondary | ICD-10-CM | POA: Diagnosis not present

## 2023-01-19 DIAGNOSIS — E875 Hyperkalemia: Secondary | ICD-10-CM | POA: Diagnosis not present

## 2023-01-19 DIAGNOSIS — D509 Iron deficiency anemia, unspecified: Secondary | ICD-10-CM | POA: Diagnosis not present

## 2023-01-19 DIAGNOSIS — Z992 Dependence on renal dialysis: Secondary | ICD-10-CM | POA: Diagnosis not present

## 2023-01-19 DIAGNOSIS — N186 End stage renal disease: Secondary | ICD-10-CM | POA: Diagnosis not present

## 2023-01-21 ENCOUNTER — Encounter: Payer: Self-pay | Admitting: Cardiology

## 2023-01-21 ENCOUNTER — Ambulatory Visit: Payer: Medicare HMO | Admitting: Cardiology

## 2023-01-21 VITALS — BP 152/63 | HR 71 | Resp 18 | Wt 250.1 lb

## 2023-01-21 DIAGNOSIS — I10 Essential (primary) hypertension: Secondary | ICD-10-CM

## 2023-01-21 DIAGNOSIS — I951 Orthostatic hypotension: Secondary | ICD-10-CM

## 2023-01-21 DIAGNOSIS — N186 End stage renal disease: Secondary | ICD-10-CM

## 2023-01-21 DIAGNOSIS — Y835 Amputation of limb(s) as the cause of abnormal reaction of the patient, or of later complication, without mention of misadventure at the time of the procedure: Secondary | ICD-10-CM | POA: Diagnosis not present

## 2023-01-21 DIAGNOSIS — M6259 Muscle wasting and atrophy, not elsewhere classified, multiple sites: Secondary | ICD-10-CM | POA: Diagnosis not present

## 2023-01-21 DIAGNOSIS — I25118 Atherosclerotic heart disease of native coronary artery with other forms of angina pectoris: Secondary | ICD-10-CM

## 2023-01-21 DIAGNOSIS — Z992 Dependence on renal dialysis: Secondary | ICD-10-CM | POA: Diagnosis not present

## 2023-01-21 DIAGNOSIS — Z4781 Encounter for orthopedic aftercare following surgical amputation: Secondary | ICD-10-CM | POA: Diagnosis not present

## 2023-01-21 MED ORDER — AMLODIPINE BESYLATE 10 MG PO TABS: 10.0000 mg | ORAL_TABLET | Freq: Every day | ORAL | 1 refills | Status: DC

## 2023-01-21 MED ORDER — ISOSORBIDE MONONITRATE ER 60 MG PO TB24
60.0000 mg | ORAL_TABLET | Freq: Every day | ORAL | 1 refills | Status: AC
Start: 2023-01-21 — End: 2024-02-11

## 2023-01-21 NOTE — Progress Notes (Signed)
Primary Physician/Referring:  Lonie Peak, PA-C  Patient ID: [name], male    DOB: 1955/12/13, 67 y.o.   MRN: [id number]  Chief Complaint  Patient presents with  . Chest Pain  . Follow-up    HPI:    [name]  is a 67 y.o. Caucasian male patient with severe peripheral arterial disease and has history of bilateral below-knee amputation, multiple peripheral interventions in the past, uncontrolled diabetes mellitus, ESRD, severe diabetic retinopathy, coronary artery disease angioplasty to the right coronary artery on 10/29/2014, right carotid endarterectomy in 2012 , OSA Unable to tolerate CPAP, chronic back pain and neck and back surgery in past and on chronic pain medications, paroxysmal atrial fibrillation on Eliquis.  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  Finally both the stumps have healed well and he is now being trained for using prosthesis.  I am seeing him for episodes of heaviness in the chest relieved with nitroglycerin.  Last episode 3 weeks ago. He has developed severe arthritis & peripheral neuropathy .  He is presently in palliative care.     Social History   Tobacco Use  . Smoking status: Every Day    Packs/day: 0.50    Years: 35.00    Additional pack years: 0.00    Total pack years: 17.50    Types: Cigarettes  . Smokeless tobacco: Never  Substance Use Topics  . Alcohol use: Not Currently    Comment: "not in a long time"   Marital Status: Married   ROS  Review of Systems  Cardiovascular:  Positive for chest pain. Negative for dyspnea on exertion and palpitations.  Neurological:  Negative for dizziness.   Objective      01/21/2023    2:09 PM 11/23/2022   11:30 AM 11/23/2022    7:24 AM  Vitals with BMI  Weight 250 lbs 2 oz    Systolic 152 181 829  Diastolic 63 70 46  Pulse 71 91 79    Blood pressure (!) 152/63, pulse 71, resp. rate 18,  weight 250 lb 1.6 oz (113.4 kg), SpO2 96 %. Body mass index is 32.11 kg/m.   Physical Exam Vitals reviewed.  Constitutional:      Appearance: He is obese.     Comments: Well built and moderately obese in no acute distress  Neck:     Vascular: Carotid bruit (bilateral) present. No JVD.  Cardiovascular:     Rate and Rhythm: Normal rate and regular rhythm.     Pulses:          Femoral pulses are 1+ on the right side and 1+ on the left side.       Right popliteal pulse not accessible and left popliteal pulse not accessible.     Heart sounds: Murmur heard.     No gallop.     Comments: Left brachial artery AV Shunt noted. Venous hum heard over the precordium. Pulmonary:     Effort: Pulmonary effort is normal.     Breath sounds: Normal breath sounds.  Abdominal:     General: Abdomen is flat.     Palpations: Abdomen is soft.  Musculoskeletal:        General: Deformity (bilataral BKA) present.   Laboratory examination:   Recent Labs    11/20/22 0437 11/21/22 1040 11/22/22 0309  NA 134* 136 135  K 3.8 3.9 3.5  CL  96* 96* 95*  CO2 23 27 25   GLUCOSE 241* 216* 160*  BUN 49* 33* 43*  CREATININE 7.12* 5.17* 5.99*  CALCIUM 7.9* 8.6* 8.7*  GFRNONAA 8* 11* 10*      Latest Ref Rng & Units 11/22/2022    3:09 AM 11/21/2022   10:40 AM 11/20/2022    4:37 AM  CMP  Glucose 70 - 99 mg/dL 161  096  045   BUN 8 - 23 mg/dL 43  33  49   Creatinine 0.61 - 1.24 mg/dL 4.09  8.11  9.14   Sodium 135 - 145 mmol/L 135  136  134   Potassium 3.5 - 5.1 mmol/L 3.5  3.9  3.8   Chloride 98 - 111 mmol/L 95  96  96   CO2 22 - 32 mmol/L 25  27  23    Calcium 8.9 - 10.3 mg/dL 8.7  8.6  7.9       Latest Ref Rng & Units 11/21/2022   10:40 AM 11/20/2022    4:37 AM 11/19/2022    8:58 PM  CBC  WBC 4.0 - 10.5 K/uL 7.5  10.1  10.6   Hemoglobin 13.0 - 17.0 g/dL 78.2  9.5  95.6   Hematocrit 39.0 - 52.0 % 31.6  29.5  33.6   Platelets 150 - 400 K/uL 194  169  202    Lipid Panel     Component Value Date/Time    CHOL 117 05/30/2020 1646   TRIG 133 05/30/2020 1646   HDL 24 (L) 05/30/2020 1646   LDLCALC 69 05/30/2020 1646   HEMOGLOBIN A1C Lab Results  Component Value Date   HGBA1C 7.0 (H) 11/20/2022   MPG 154 11/20/2022   Cardiac Studies:   Coronary angiogram 09/17/18: Widely patent stent stents. Normal LVEF. Coronary Angiogram 10/29/2014: CAD s/p stenting of the distal, mid and proximal RCA with implantation of 3 overlapping drug-eluting stent, from distal to proximal 2.5 x 38 mm, 2.75 x 38 mm and a 2.75 x 20 mm Promus premier DES.   Carotid artery duplex  06/09/2019:  Minimal stenosis in bilateral ICA of 1-15%. Heterogeneous plaque. Stenosis  in the right external carotid artery (<50%).  Left carotid endarterectomy site is patent.  Right vertebral artery flow is not well visualized. Antegrade left  vertebral artery flow.  No significant change from 03/01/2017. Follow up studies when clinically  Indicated.  Echocardiogram 06/25/2021: 1. Left ventricular ejection fraction, by estimation, is 55 to 60%. The left ventricle has normal function. The left ventricle has no regional wall motion abnormalities. There is mild concentric left ventricular hypertrophy. Left ventricular diastolic parameters are indeterminate.   2. Right ventricular systolic function is normal. The right ventricular size is normal.   3. Left atrial size was moderately dilated.   4. The mitral valve is normal in structure. No evidence of mitral valve regurgitation. Moderate mitral annular calcification.   5. The aortic valve is tricuspid. Aortic valve regurgitation is not visualized. Mild to moderate aortic valve sclerosis/calcification is present, without any evidence of aortic stenosis.  EKG:   EKG 01/21/2023: Sinus rhythm with first-degree block at that of 68 bpm, left atrial enlargement, left axis deviation, left anterior fascicular block.  Right bundle branch block.  Trifascicular block.  LVH.  Nonspecific T abnormality.   Compared to 10/29/2022, no significant change.   06/12/2021: Atypical atrial flutter with RVR at the rate of 106 bpm, left axis deviation, left anterior fascicular block.  Right bundle branch block.  Nonspecific  T abnormality.   Allergies   Allergies  Allergen Reactions  . Contrast Media [Iodinated Contrast Media] Shortness Of Breath and Other (See Comments)    Difficulty breathing and altered mental status    . Ivp Dye [Iodinated Contrast Media] Anaphylaxis, Shortness Of Breath and Other (See Comments)    Breathing problems, altered mental state   . Adhesive [Tape] Rash and Other (See Comments)    Rash after 1 day of use Do not leave on longer then 3 days with out be changed  . Latex Rash and Other (See Comments)    A severe rash appears after the first 24 hours of being placed    Current Outpatient Medications:  .  amiodarone (PACERONE) 200 MG tablet, Take 1 tablet (200 mg total) by mouth daily. (Patient taking differently: Take 100 mg by mouth daily.), Disp: 100 tablet, Rfl: 1 .  apixaban (ELIQUIS) 5 MG TABS tablet, Take 1 tablet (5 mg total) by mouth 2 (two) times daily., Disp: 60 tablet, Rfl: 1 .  atorvastatin (LIPITOR) 40 MG tablet, Take 40 mg by mouth at bedtime. , Disp: , Rfl:  .  B Complex-C-Zn-Folic Acid (DIALYVITE/ZINC) TABS, Take 1 tablet by mouth Every Tuesday,Thursday,and Saturday with dialysis., Disp: , Rfl:  .  famotidine (PEPCID) 20 MG tablet, Take 20 mg by mouth every other day., Disp: , Rfl:  .  fentaNYL (DURAGESIC) 50 MCG/HR, Place 1 patch onto the skin every 3 (three) days., Disp: 5 patch, Rfl: 0 .  folic acid (FOLVITE) 1 MG tablet, Take 1 mg by mouth daily., Disp: , Rfl:  .  Glucosamine HCl (GLUCOSAMINE PO), Take 500 mg by mouth 2 (two) times daily., Disp: , Rfl:  .  insulin aspart (NOVOLOG FLEXPEN) 100 UNIT/ML FlexPen, Inject 0-12 Units into the skin See admin instructions. Inject as per sliding scale: If 0 - 69 = 0 initiate hypoglycemia orders;  70 - 150 = 0;   151 - 200 = 2;  201 - 250 = 4;  251 - 300 = 6;  301 - 350 = 8;  351 - 400 = 10;  401 - 450 = 12.  If >400, give 12 units, recheck in 1 hour. If still >400, call MD/NP for further orders. Subcutaneously with meals in addition to scheduled 4 unites with meals., Disp: , Rfl:  .  insulin glargine (LANTUS) 100 UNIT/ML injection, Inject 0.2 mLs (20 Units total) into the skin at bedtime., Disp: 10 mL, Rfl: 0 .  isosorbide mononitrate (IMDUR) 60 MG 24 hr tablet, Take 1 tablet (60 mg total) by mouth daily., Disp: 90 tablet, Rfl: 1 .  lidocaine-prilocaine (EMLA) cream, Apply 1 application topically See admin instructions. Prior to Dialysis days Tuesday,Thursday and saturday, Disp: , Rfl:  .  loratadine (CLARITIN) 10 MG tablet, Take 10 mg by mouth daily., Disp: , Rfl:  .  metoCLOPramide (REGLAN) 5 MG tablet, Take 1 tablet (5 mg total) by mouth 4 (four) times daily -  before meals and at bedtime. (Patient taking differently: Take 5 mg by mouth in the morning, at noon, in the evening, and at bedtime.), Disp: 360 tablet, Rfl: 1 .  NEEDLE, REUSABLE, 22 G 22G X 1-1/2" MISC, 1 Units by Does not apply route as directed. For B12 IM inj, Disp: 10 each, Rfl: 0 .  nitroGLYCERIN (NITROSTAT) 0.4 MG SL tablet, Place 0.4 mg under the tongue every 5 (five) minutes as needed for chest pain., Disp: , Rfl:  .  omega-3 acid ethyl esters (LOVAZA)  1 g capsule, Take 2 g by mouth 2 (two) times daily., Disp: , Rfl:  .  pantoprazole (PROTONIX) 40 MG tablet, Take 1 tablet (40 mg total) by mouth 2 (two) times daily for 30 days, THEN 1 tablet (40 mg total) daily. (Patient taking differently:  1 tablet (40 mg total) twice daily.), Disp: 120 tablet, Rfl: 0 .  Polyvinyl Alcohol-Povidone PF 1.4-0.6 % SOLN, Place 1 drop into both eyes 2 (two) times daily as needed (dry eyes). , Disp: , Rfl:  .  pregabalin (LYRICA) 100 MG capsule, Take 1 capsule (100 mg total) by mouth 2 (two) times daily for 5 days., Disp: 10 capsule, Rfl: 0 .  rOPINIRole (REQUIP)  2 MG tablet, Take 2 mg by mouth at bedtime., Disp: , Rfl:  .  senna-docusate (SENOKOT-S) 8.6-50 MG tablet, Take 2 tablets by mouth at bedtime., Disp: , Rfl:  .  Syringe/Needle, Disp, (SYRINGE 3CC/22GX1-1/2") 22G X 1-1/2" 3 ML MISC, 1 Syringe by Does not apply route as directed. For b12 IM inj, Disp: 10 each, Rfl: 0 .  tamsulosin (FLOMAX) 0.4 MG CAPS capsule, Take 0.4 mg by mouth daily., Disp: , Rfl:  .  amLODipine (NORVASC) 10 MG tablet, Take 1 tablet (10 mg total) by mouth daily., Disp: 90 tablet, Rfl: 1   Assessment     ICD-10-CM   1. Coronary artery disease of native artery of native heart with stable angina pectoris (HCC)  I25.118 EKG 12-Lead    isosorbide mononitrate (IMDUR) 60 MG 24 hr tablet    2. Primary hypertension  I10 amLODipine (NORVASC) 10 MG tablet    3. Orthostatic hypotension  I95.1     4. Supine hypertension  I10        Meds ordered this encounter  Medications  . amLODipine (NORVASC) 10 MG tablet    Sig: Take 1 tablet (10 mg total) by mouth daily.    Dispense:  90 tablet    Refill:  1  . isosorbide mononitrate (IMDUR) 60 MG 24 hr tablet    Sig: Take 1 tablet (60 mg total) by mouth daily.    Dispense:  90 tablet    Refill:  1    Medications Discontinued During This Encounter  Medication Reason  . NOVOLOG FLEXPEN 100 UNIT/ML FlexPen   . midodrine (PROAMATINE) 10 MG tablet Discontinued by provider  . amLODipine (NORVASC) 5 MG tablet Reorder   CHA2DS2-VASc Score is 4.  Yearly risk of stroke: 5% (A, HTN, Vasc Dz).  Score of 1=0.6; 2=2.2; 3=3.2; 4=4.8; 5=7.2; 6=9.8; 7=>9.8) -(CHF; HTN; vasc disease DM,  Male = 1; Age <65 =0; 65-74 = 1,  >75 =2; stroke/embolism= 2).     Recommendations:   [name]  is a 67 y.o. Caucasian male patient with severe peripheral arterial disease and has history of bilateral below-knee amputation, multiple peripheral interventions in the past, uncontrolled diabetes mellitus, ESRD, severe diabetic retinopathy, coronary artery  disease angioplasty to the right coronary artery on 10/29/2014, right carotid endarterectomy in 2012 , OSA Unable to tolerate CPAP, chronic back pain and neck and back surgery in past and on chronic pain medications, paroxysmal atrial fibrillation on Eliquis.  1. 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  Finally both the stumps have healed well and he is now being trained for using prosthesis.  I am seeing him for episodes of heaviness in the chest relieved with nitroglycerin.  Last episode 3 weeks ago.  Suspect hypertension could be playing a role, since bilateral below-knee amputations, no further sepsis, as he is not ambulatory, presently on midodrine for previously noted orthostatic hypotension which I will discontinue.  Increase amlodipine from 5 mg to 10 mg daily.  Reviewed his chart, he has not had any issues with dialysis, neither has he had any issues with hypotension.  Will also add isosorbide mononitrate 60 mg daily.  In view of his underlying medical comorbidity, would not recommend invasive strategy unless needed.  Wife present and all questions answered.  - EKG 12-Lead - isosorbide mononitrate (IMDUR) 60 MG 24 hr tablet; Take 1 tablet (60 mg total) by mouth daily.  Dispense: 90 tablet; Refill: 1  2. Primary hypertension Please see above. - amLODipine (NORVASC) 10 MG tablet; Take 1 tablet (10 mg total) by mouth daily.  Dispense: 90 tablet; Refill: 1  3. Orthostatic hypotension Suspect orthostasis is now resolved.  Unable to check his blood pressure orthostatics in view of below-knee amputation status bilateral.  4. Supine hypertension Please see above discussion.  5. ESRD on hemodialysis (HCC) On hemodialysis without any complications and has not had any difficulty with hemodialysis either.   I will see him back in 3 months for follow-up.   Yates Decamp, MD, [facility] 01/21/2023, 3:49 PM Office: [phone] Fax: [phone] Pager: [phone]

## 2023-01-22 DIAGNOSIS — Y835 Amputation of limb(s) as the cause of abnormal reaction of the patient, or of later complication, without mention of misadventure at the time of the procedure: Secondary | ICD-10-CM | POA: Diagnosis not present

## 2023-01-22 DIAGNOSIS — Z4781 Encounter for orthopedic aftercare following surgical amputation: Secondary | ICD-10-CM | POA: Diagnosis not present

## 2023-01-22 DIAGNOSIS — Z992 Dependence on renal dialysis: Secondary | ICD-10-CM | POA: Diagnosis not present

## 2023-01-22 DIAGNOSIS — I5032 Chronic diastolic (congestive) heart failure: Secondary | ICD-10-CM | POA: Diagnosis not present

## 2023-01-22 DIAGNOSIS — I209 Angina pectoris, unspecified: Secondary | ICD-10-CM | POA: Diagnosis not present

## 2023-01-22 DIAGNOSIS — I10 Essential (primary) hypertension: Secondary | ICD-10-CM | POA: Diagnosis not present

## 2023-01-22 DIAGNOSIS — M6259 Muscle wasting and atrophy, not elsewhere classified, multiple sites: Secondary | ICD-10-CM | POA: Diagnosis not present

## 2023-01-22 DIAGNOSIS — I482 Chronic atrial fibrillation, unspecified: Secondary | ICD-10-CM | POA: Diagnosis not present

## 2023-01-23 DIAGNOSIS — M6259 Muscle wasting and atrophy, not elsewhere classified, multiple sites: Secondary | ICD-10-CM | POA: Diagnosis not present

## 2023-01-23 DIAGNOSIS — Z992 Dependence on renal dialysis: Secondary | ICD-10-CM | POA: Diagnosis not present

## 2023-01-23 DIAGNOSIS — Z4781 Encounter for orthopedic aftercare following surgical amputation: Secondary | ICD-10-CM | POA: Diagnosis not present

## 2023-01-23 DIAGNOSIS — Y835 Amputation of limb(s) as the cause of abnormal reaction of the patient, or of later complication, without mention of misadventure at the time of the procedure: Secondary | ICD-10-CM | POA: Diagnosis not present

## 2023-01-24 DIAGNOSIS — Z992 Dependence on renal dialysis: Secondary | ICD-10-CM | POA: Diagnosis not present

## 2023-01-24 DIAGNOSIS — D631 Anemia in chronic kidney disease: Secondary | ICD-10-CM | POA: Diagnosis not present

## 2023-01-24 DIAGNOSIS — N186 End stage renal disease: Secondary | ICD-10-CM | POA: Diagnosis not present

## 2023-01-24 DIAGNOSIS — E875 Hyperkalemia: Secondary | ICD-10-CM | POA: Diagnosis not present

## 2023-01-24 DIAGNOSIS — D509 Iron deficiency anemia, unspecified: Secondary | ICD-10-CM | POA: Diagnosis not present

## 2023-01-24 DIAGNOSIS — N2581 Secondary hyperparathyroidism of renal origin: Secondary | ICD-10-CM | POA: Diagnosis not present

## 2023-01-25 DIAGNOSIS — Y835 Amputation of limb(s) as the cause of abnormal reaction of the patient, or of later complication, without mention of misadventure at the time of the procedure: Secondary | ICD-10-CM | POA: Diagnosis not present

## 2023-01-25 DIAGNOSIS — M6259 Muscle wasting and atrophy, not elsewhere classified, multiple sites: Secondary | ICD-10-CM | POA: Diagnosis not present

## 2023-01-25 DIAGNOSIS — Z992 Dependence on renal dialysis: Secondary | ICD-10-CM | POA: Diagnosis not present

## 2023-01-25 DIAGNOSIS — Z4781 Encounter for orthopedic aftercare following surgical amputation: Secondary | ICD-10-CM | POA: Diagnosis not present

## 2023-01-26 DIAGNOSIS — M6259 Muscle wasting and atrophy, not elsewhere classified, multiple sites: Secondary | ICD-10-CM | POA: Diagnosis not present

## 2023-01-26 DIAGNOSIS — Z4781 Encounter for orthopedic aftercare following surgical amputation: Secondary | ICD-10-CM | POA: Diagnosis not present

## 2023-01-26 DIAGNOSIS — N186 End stage renal disease: Secondary | ICD-10-CM | POA: Diagnosis not present

## 2023-01-26 DIAGNOSIS — N2581 Secondary hyperparathyroidism of renal origin: Secondary | ICD-10-CM | POA: Diagnosis not present

## 2023-01-26 DIAGNOSIS — E875 Hyperkalemia: Secondary | ICD-10-CM | POA: Diagnosis not present

## 2023-01-26 DIAGNOSIS — Z992 Dependence on renal dialysis: Secondary | ICD-10-CM | POA: Diagnosis not present

## 2023-01-26 DIAGNOSIS — Y835 Amputation of limb(s) as the cause of abnormal reaction of the patient, or of later complication, without mention of misadventure at the time of the procedure: Secondary | ICD-10-CM | POA: Diagnosis not present

## 2023-01-26 DIAGNOSIS — D509 Iron deficiency anemia, unspecified: Secondary | ICD-10-CM | POA: Diagnosis not present

## 2023-01-26 DIAGNOSIS — D631 Anemia in chronic kidney disease: Secondary | ICD-10-CM | POA: Diagnosis not present

## 2023-01-28 ENCOUNTER — Ambulatory Visit (INDEPENDENT_AMBULATORY_CARE_PROVIDER_SITE_OTHER): Payer: Medicare HMO | Admitting: Orthopedic Surgery

## 2023-01-28 ENCOUNTER — Encounter: Payer: Self-pay | Admitting: Orthopedic Surgery

## 2023-01-28 DIAGNOSIS — M6259 Muscle wasting and atrophy, not elsewhere classified, multiple sites: Secondary | ICD-10-CM | POA: Diagnosis not present

## 2023-01-28 DIAGNOSIS — Z992 Dependence on renal dialysis: Secondary | ICD-10-CM | POA: Diagnosis not present

## 2023-01-28 DIAGNOSIS — Z89511 Acquired absence of right leg below knee: Secondary | ICD-10-CM

## 2023-01-28 DIAGNOSIS — Z4781 Encounter for orthopedic aftercare following surgical amputation: Secondary | ICD-10-CM | POA: Diagnosis not present

## 2023-01-28 DIAGNOSIS — Y835 Amputation of limb(s) as the cause of abnormal reaction of the patient, or of later complication, without mention of misadventure at the time of the procedure: Secondary | ICD-10-CM | POA: Diagnosis not present

## 2023-01-28 DIAGNOSIS — Z89512 Acquired absence of left leg below knee: Secondary | ICD-10-CM

## 2023-01-28 NOTE — Progress Notes (Signed)
Office Visit Note   Patient: [name]           Date of Birth: 12-12-1955           MRN: [id number] Visit Date: 01/28/2023              Requested by: Lonie Peak, PA-C [address] [place],  [place] [address] PCP: Lonie Peak, PA-C  Chief Complaint  Patient presents with   Left Leg - Follow-up    07/04/2022 left BKA       HPI: Patient is a 67 year old gentleman who is 7 months status post left transtibial amputation with a previous right transtibial amputation.  Patient ambulates in a motorized chair.  Assessment & Plan: Visit Diagnoses:  1. S/P BKA (below knee amputation) unilateral, left (HCC)   2. S/P BKA (below knee amputation), right [facility])     Plan: Patient will begin physical therapy for gait training.  Follow-Up Instructions: No follow-ups on file.   Ortho Exam  Patient is alert, oriented, no adenopathy, well-dressed, normal affect, normal respiratory effort. Examination both residual limbs are well-healed.  Patient has his prosthesis he will begin gait training.  Imaging: No results found. No images are attached to the encounter.  Labs: Lab Results  Component Value Date   HGBA1C 7.0 (H) 11/20/2022   HGBA1C 7.6 (H) 01/11/2022   HGBA1C 7.9 (H) 06/14/2021   ESRSEDRATE 26 (H) 06/23/2022   ESRSEDRATE 40 (H) 01/13/2022   ESRSEDRATE 55 (H) 01/11/2022   CRP 14.0 (H) 06/23/2022   CRP 3.6 (H) 01/13/2022   CRP 4.2 (H) 01/11/2022   REPTSTATUS 11/25/2022 FINAL 11/20/2022   GRAMSTAIN  06/14/2021    NO ORGANISMS SEEN SQUAMOUS EPITHELIAL CELLS PRESENT ABUNDANT WBC PRESENT,BOTH PMN AND MONONUCLEAR MODERATE GRAM POSITIVE COCCI    CULT  11/20/2022    NO GROWTH 5 DAYS Performed at [facility], [address], [place], [place] [address]    LABORGA GROUP B STREP(S.AGALACTIAE)ISOLATED 06/23/2022     Lab Results  Component Value Date   ALBUMIN 2.3 (L) 11/22/2022   ALBUMIN 2.3 (L) 11/21/2022   ALBUMIN 2.2 (L) 11/20/2022   PREALBUMIN 18  06/23/2022   PREALBUMIN 19.0 05/04/2016   PREALBUMIN 25.0 12/16/2015    Lab Results  Component Value Date   MG 1.8 11/20/2022   MG 1.5 (L) 06/29/2022   MG 1.5 (L) 06/28/2022   No results found for: "VD25OH"  Lab Results  Component Value Date   PREALBUMIN 18 06/23/2022   PREALBUMIN 19.0 05/04/2016   PREALBUMIN 25.0 12/16/2015      Latest Ref Rng & Units 11/21/2022   10:40 AM 11/20/2022    4:37 AM 11/19/2022    8:58 PM  CBC EXTENDED  WBC 4.0 - 10.5 K/uL 7.5  10.1  10.6   RBC 4.22 - 5.81 MIL/uL 3.58  3.20  3.68   Hemoglobin 13.0 - 17.0 g/dL 52.8  9.5  41.3   HCT 24.4 - 52.0 % 31.6  29.5  33.6   Platelets 150 - 400 K/uL 194  169  202   NEUT# 1.7 - 7.7 K/uL   8.3   Lymph# 0.7 - 4.0 K/uL   1.2      There is no height or weight on file to calculate BMI.  Orders:  No orders of the defined types were placed in this encounter.  No orders of the defined types were placed in this encounter.    Procedures: No procedures performed  Clinical Data:  No additional findings.  ROS:  All other systems negative, except as noted in the HPI. Review of Systems  Objective: Vital Signs: There were no vitals taken for this visit.  Specialty Comments:  No specialty comments available.  PMFS History: Patient Active Problem List   Diagnosis Date Noted   Cellulitis 11/20/2022   Chest wall pain 07/04/2022   S/P BKA (below knee amputation) unilateral, left (HCC) 07/04/2022   Subacute osteomyelitis, left ankle and foot (HCC) 07/03/2022   Atrial fibrillation, chronic (HCC) 01/13/2022   Physical deconditioning 01/13/2022   Anemia of renal disease 01/13/2022   Obesity (BMI 30-39.9) 01/13/2022   Decubitus ulcer of buttock 01/13/2022   Intractable nausea and vomiting with epigastric pain 01/11/2022   Chronic osteomyelitis (HCC) 01/11/2022   Proliferative diabetic retinopathy of left eye with macular edema associated with type 2 diabetes mellitus (HCC) 11/27/2021   Vitreous hemorrhage  of right eye (HCC) 11/27/2021   Acute metabolic encephalopathy 09/05/2021   COVID 09/02/2021   Sepsis (HCC) 09/02/2021   Severe sepsis with lactic acidosis (HCC) 06/14/2021   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) 06/14/2021   ESRD on hemodialysis (HCC) 06/14/2021   Paroxysmal atrial fibrillation with RVR (HCC) 06/14/2021   Carotid stenosis 06/15/2020   Dyslipidemia 06/15/2020   Diabetic macular edema of right eye with proliferative retinopathy associated with type 2 diabetes mellitus (HCC) 01/18/2020   Severe nonproliferative diabetic retinopathy of left eye, with macular edema, associated with type 2 diabetes mellitus (HCC) 01/04/2020   Retinal hemorrhage of right eye 01/04/2020   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   Pseudoclaudication 11/15/2018   Essential hypertension 09/18/2018   S/P BKA (below knee amputation), right (HCC) 09/18/2018   Amputation of toe of left foot (HCC) 09/18/2018   Peripheral artery disease (HCC) 09/18/2018   S/P carotid endarterectomy 09/18/2018   OSA not tolerating CPAP 09/18/2018   Chronic back pain 09/18/2018   Status post reversal of ileostomy 05/21/2018   Normocytic anemia 02/14/2018   Intra-abdominal abscess (HCC) 11/04/2017   Chronic venous hypertension (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 (HCC) 12/11/2016   PAD (peripheral artery disease) (HCC) 11/12/2016   Unilateral primary osteoarthritis, left knee 10/11/2016   History of right below knee amputation (HCC) 07/12/2016   Infected diabetic left foot ulcer with chronic osteomyelitis    Diabetic foot infection (HCC) 05/04/2016   Insulin dependent type 2 diabetes mellitus (HCC) 12/16/2015   Amputated toe (HCC) 10/21/2015   Cellulitis of left lower extremity 09/03/2015   Leg edema, left 09/03/2015   CAD (dz of distal, mid and proximal RCA with implantation of 3 overlapping drug-eluting stent,) 09/03/2015   Chronic diastolic heart failure, NYHA class 2  (HCC) 09/03/2015   Angina pectoris associated with type 2 diabetes mellitus (HCC) 10/28/2014   Hyperlipidemia 08/25/2014   Limb pain 03/20/2013   DJD (degenerative joint disease) 09/25/2012   Migraine 09/25/2012   Neuropathy 09/25/2012   Restless legs syndrome (RLS) 09/25/2012   Chronic obstructive pulmonary disease, unspecified (HCC) 04/25/2012   Unknown cause of morbidity or mortality 04/25/2012   Chronic total occlusion of artery of the extremities (HCC) 04/08/2012   Onychomycosis 02/01/2012   Occlusion and stenosis of carotid artery without mention of cerebral infarction 06/12/2011   GERD (gastroesophageal reflux disease) 05/08/2011   Barrett's esophagus without dysplasia 05/08/2011   Former tobacco use 02/01/2011   Past Medical History:  Diagnosis Date   Carotid artery occlusion 11/10/10   LEFT CAROTID ENDARTERECTOMY   Chronic kidney disease  Complication of anesthesia    BP WENT UP AT [facility] "   COPD (chronic obstructive pulmonary disease) (HCC)    pt denies this dx as of 06/01/20 - no inhaler    Diabetes mellitus without complication (HCC)    Diverticulitis    Diverticulosis of colon (without mention of hemorrhage)    DJD (degenerative joint disease)    knees/hands/feet/back/neck   Fatty liver    Full dentures    GERD (gastroesophageal reflux disease)    H/O hiatal hernia    History of blood transfusion    with a past surical procedure per patient 06/01/20   Hyperlipidemia    Hypertension    Neuromuscular disorder (HCC)    peripheral neuropathy   Non-pressure chronic ulcer of other part of left foot limited to breakdown of skin (HCC) 11/12/2016   Osteomyelitis (HCC)    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 Atherectomy left popliteal artery July 2012.    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 Before age 83   Hyperlipidemia Brother    Hypertension Brother    Hypertension Son    Arthritis Other        GRANDMOTHER   Hypertension Other        OTHER FAMILY MEMBERS    Past Surgical History:  Procedure Laterality Date   ABDOMINAL AORTOGRAM W/LOWER EXTREMITY N/A 06/23/2021   Procedure: ABDOMINAL AORTOGRAM W/LOWER EXTREMITY;  Surgeon: Elder Negus, MD;  [facility];  Service: Cardiovascular;  Laterality: N/A;   AMPUTATION  11/05/2011   Procedure: AMPUTATION RAY;  Surgeon: Toni Arthurs, MD;  Location: [facility] OR;  Service: Orthopedics;  Laterality: Right;  Amputation of Right 4&5th Toes   AMPUTATION Left 11/26/2012   Procedure: AMPUTATION RAY;  Surgeon: Toni Arthurs, MD;  Location: [facility] OR;  Service: Orthopedics;  Laterality: Left;  fourth ray amputation   AMPUTATION Right 08/27/2014   Procedure: Transmetatarsal Amputation;  Surgeon: Nadara Mustard, MD;  Location: [facility] OR;  Service: Orthopedics;  Laterality: Right;   AMPUTATION Right 01/14/2015   Procedure: AMPUTATION BELOW KNEE;  Surgeon: Nadara Mustard, MD;  Location: [facility];  Service: Orthopedics;  Laterality: Right;   AMPUTATION Left 10/21/2015   Procedure: Left Foot 5th Ray Amputation;  Surgeon: Nadara Mustard, MD;  Location: [facility] OR;  Service: Orthopedics;  Laterality: Left;   AMPUTATION Left 07/04/2022   Procedure: LEFT BELOW KNEE AMPUTATION;  Surgeon:  Nadara Mustard, MD;  Location: [facility] OR;   Service: Orthopedics;  Laterality: Left;   ANTERIOR FUSION CERVICAL SPINE  02/06/06   C4-5, C5-6, C6-7; SURGEON DR. MAX COHEN   AV FISTULA PLACEMENT Left 06/02/2020   Procedure: ARTERIOVENOUS (AV) FISTULA CREATION LEFT;  Surgeon: Maeola Harman, MD;  Location: [facility] OR;  Service: Vascular;  Laterality: Left;   BACK SURGERY     x 3   BASCILIC VEIN TRANSPOSITION Left 07/21/2020   Procedure: LEFT UPPER ARM ATERIOVENOUS SUPERFISTULALIZATION;  Surgeon: Maeola Harman, MD;  Location: [facility] OR;  Service: Vascular;  Laterality: Left;   BELOW KNEE LEG AMPUTATION Right    CARDIAC CATHETERIZATION  10/31/04   2009   CAROTID ENDARTERECTOMY  11/10/10   CAROTID ENDARTERECTOMY Left 11/10/2010   Subtotal occlusion of left internal carotid artery with left hemispheric transient ischemic attacks.   CAROTID STENT     CARPAL TUNNEL RELEASE Right 10/21/2013   Procedure: RIGHT CARPAL TUNNEL RELEASE;  Surgeon: Nicki Reaper, MD;  Location: [facility];  Service: Orthopedics;  Laterality: Right;   CHOLECYSTECTOMY     COLON SURGERY     COLONOSCOPY     COLOSTOMY REVERSAL  05/21/2018   ileostomy reversal   CYSTOSCOPY WITH STENT PLACEMENT Bilateral 01/13/2018   Procedure: CYSTOSCOPY WITH BILATERAL URETERAL CATHETER PLACEMENT;  Surgeon: Crist Fat, MD;  Location: [facility];  Service: Urology;  Laterality: Bilateral;   ESOPHAGEAL MANOMETRY Bilateral 07/19/2014   Procedure: ESOPHAGEAL MANOMETRY (EM);  Surgeon: Beverley Fiedler, MD;  Location: [facility];  Service: Gastroenterology;  Laterality: Bilateral;   EYE SURGERY Bilateral 2020   cataract   FEMORAL ARTERY STENT     x6   FINGER SURGERY     FOOT SURGERY  04/25/2016    EXCISION BASE 5TH METATARSAL AND PARTIAL CUBOID LEFT FOOT   HERNIA REPAIR     LEFT INGUINAL AND UMBILICAL REPAIRS   HERNIA REPAIR     I & D EXTREMITY Left 04/25/2016   Procedure: EXCISION BASE 5TH METATARSAL AND PARTIAL CUBOID LEFT FOOT;  Surgeon: Nadara Mustard, MD;   [facility]: [facility];  Service: Orthopedics;  Laterality: Left;   ILEOSTOMY  01/13/2018   Procedure: ILEOSTOMY;  Surgeon: Berna Bue, MD;  Location: [facility];  Service: General;;   ILEOSTOMY CLOSURE N/A 05/21/2018   Procedure: ILEOSTOMY REVERSAL ERAS PATHWAY;  Surgeon: Berna Bue, MD;  Location: [facility];  Service: General;  Laterality: N/A;   IR RADIOLOGIST EVAL & MGMT  11/19/2017   IR RADIOLOGIST EVAL & MGMT  12/03/2017   IR RADIOLOGIST EVAL & MGMT  12/18/2017   JOINT REPLACEMENT Right 2001   Total knee   LAMINECTOMY     X 3 LUMBAR AND X 2 CERVICAL SPINE OPERATIONS   LAPAROSCOPIC CHOLECYSTECTOMY W/ CHOLANGIOGRAPHY  11/09/04   SURGEON DR. Caleen Essex   LEFT HEART CATH AND CORONARY ANGIOGRAPHY N/A 09/16/2018   Procedure: LEFT HEART CATH AND CORONARY ANGIOGRAPHY;  Surgeon: Elder Negus, MD;  [facility];  Service: Cardiovascular;  Laterality: N/A;   LEFT HEART CATHETERIZATION WITH CORONARY ANGIOGRAM N/A 10/29/2014   Procedure: LEFT HEART CATHETERIZATION WITH CORONARY ANGIOGRAM;  Surgeon: Pamella Pert, MD;  Location: [facility] CATH LAB;  Service: Cardiovascular;  Laterality: N/A;   LIGATION OF COMPETING BRANCHES OF ARTERIOVENOUS FISTULA Left 07/21/2020   Procedure: LIGATION OF COMPETING BRANCHES OF LEFT UPPER ARM ARTERIOVENOUS FISTULA;  Surgeon: Maeola Harman, MD;  Location: [facility] OR;  Service:  Vascular;  Laterality: Left;   LOWER EXTREMITY ANGIOGRAM N/A 03/19/2012   Procedure: LOWER EXTREMITY ANGIOGRAM;  Surgeon: Kathleene Hazel, MD;  Location: [facility] CATH LAB;  Service: Cardiovascular;  Laterality: N/A;   LOWER EXTREMITY ANGIOGRAPHY N/A 06/20/2021   Procedure: LOWER EXTREMITY ANGIOGRAPHY;  Surgeon: Elder Negus, MD;  [facility];  Service: Cardiovascular;  Laterality: N/A;   NECK SURGERY     PARTIAL COLECTOMY N/A 01/13/2018   Procedure: LAPAROSCOPIC ASSISTED   SIGMOID COLECTOMY ILEOSTOMY;  Surgeon: Berna Bue, MD;  Location: [facility];   Service: General;  Laterality: N/A;   PENILE PROSTHESIS IMPLANT  08/14/05   INFRAPUBIC INSERTION OF INFLATABLE PENILE PROSTHESIS; SURGEON DR. Logan Bores   PENILE PROSTHESIS IMPLANT     PERCUTANEOUS CORONARY STENT INTERVENTION (PCI-S) Right 10/29/2014   Procedure: PERCUTANEOUS CORONARY STENT INTERVENTION (PCI-S);  Surgeon: Pamella Pert, MD;  Location: [facility] CATH LAB;  Service: Cardiovascular;  Laterality: Right;   PERIPHERAL VASCULAR INTERVENTION Left 06/23/2021   Procedure: PERIPHERAL VASCULAR INTERVENTION;  Surgeon: Elder Negus, MD;  [facility];  Service: Cardiovascular;  Laterality: Left;   REMOVAL OF PENILE PROSTHESIS N/A 06/14/2021   Procedure: Removal of THREE piece inflatable penile prosthesis;  Surgeon: Crista Elliot, MD;  Location: [facility] OR;  Service: Urology;  Laterality: N/A;   SHOULDER ARTHROSCOPY     SPINE SURGERY     TOE AMPUTATION Left    TONSILLECTOMY     TOTAL KNEE ARTHROPLASTY  07/2002   RIGHT KNEE ; SURGEON  DR. [name] ALSO HAD ARTHROSCOPIC RIGHT KNEE IN  10/2001   TOTAL KNEE ARTHROPLASTY     ULNAR NERVE TRANSPOSITION Right 10/21/2013   Procedure: RIGHT ELBOW  ULNAR NERVE DECOMPRESSION;  Surgeon: Nicki Reaper, MD;  Location: [facility];  Service: Orthopedics;  Laterality: Right;   Social History   Occupational History   Occupation: Publishing rights manager: UNEMPLOYED  Tobacco Use   Smoking status: Every Day    Packs/day: 0.50    Years: 35.00    Additional pack years: 0.00    Total pack years: 17.50    Types: Cigarettes   Smokeless tobacco: Never  Vaping Use   Vaping Use: Every day   Substances: Nicotine  Substance and Sexual Activity   Alcohol use: Not Currently    Comment: "not in a long time"   Drug use: Never   Sexual activity: Yes    Birth control/protection: Implant    Comment: penile implant

## 2023-01-29 DIAGNOSIS — N2581 Secondary hyperparathyroidism of renal origin: Secondary | ICD-10-CM | POA: Diagnosis not present

## 2023-01-29 DIAGNOSIS — Z89512 Acquired absence of left leg below knee: Secondary | ICD-10-CM | POA: Diagnosis not present

## 2023-01-29 DIAGNOSIS — Z992 Dependence on renal dialysis: Secondary | ICD-10-CM | POA: Diagnosis not present

## 2023-01-29 DIAGNOSIS — N186 End stage renal disease: Secondary | ICD-10-CM | POA: Diagnosis not present

## 2023-01-29 DIAGNOSIS — Z89511 Acquired absence of right leg below knee: Secondary | ICD-10-CM | POA: Diagnosis not present

## 2023-01-29 DIAGNOSIS — E875 Hyperkalemia: Secondary | ICD-10-CM | POA: Diagnosis not present

## 2023-01-29 DIAGNOSIS — D509 Iron deficiency anemia, unspecified: Secondary | ICD-10-CM | POA: Diagnosis not present

## 2023-01-29 DIAGNOSIS — D631 Anemia in chronic kidney disease: Secondary | ICD-10-CM | POA: Diagnosis not present

## 2023-01-30 DIAGNOSIS — Y835 Amputation of limb(s) as the cause of abnormal reaction of the patient, or of later complication, without mention of misadventure at the time of the procedure: Secondary | ICD-10-CM | POA: Diagnosis not present

## 2023-01-30 DIAGNOSIS — Z992 Dependence on renal dialysis: Secondary | ICD-10-CM | POA: Diagnosis not present

## 2023-01-30 DIAGNOSIS — M6259 Muscle wasting and atrophy, not elsewhere classified, multiple sites: Secondary | ICD-10-CM | POA: Diagnosis not present

## 2023-01-30 DIAGNOSIS — Z4781 Encounter for orthopedic aftercare following surgical amputation: Secondary | ICD-10-CM | POA: Diagnosis not present

## 2023-01-31 DIAGNOSIS — Z992 Dependence on renal dialysis: Secondary | ICD-10-CM | POA: Diagnosis not present

## 2023-01-31 DIAGNOSIS — E875 Hyperkalemia: Secondary | ICD-10-CM | POA: Diagnosis not present

## 2023-01-31 DIAGNOSIS — Y835 Amputation of limb(s) as the cause of abnormal reaction of the patient, or of later complication, without mention of misadventure at the time of the procedure: Secondary | ICD-10-CM | POA: Diagnosis not present

## 2023-01-31 DIAGNOSIS — N186 End stage renal disease: Secondary | ICD-10-CM | POA: Diagnosis not present

## 2023-01-31 DIAGNOSIS — D509 Iron deficiency anemia, unspecified: Secondary | ICD-10-CM | POA: Diagnosis not present

## 2023-01-31 DIAGNOSIS — Z4781 Encounter for orthopedic aftercare following surgical amputation: Secondary | ICD-10-CM | POA: Diagnosis not present

## 2023-01-31 DIAGNOSIS — N2581 Secondary hyperparathyroidism of renal origin: Secondary | ICD-10-CM | POA: Diagnosis not present

## 2023-01-31 DIAGNOSIS — M6259 Muscle wasting and atrophy, not elsewhere classified, multiple sites: Secondary | ICD-10-CM | POA: Diagnosis not present

## 2023-01-31 DIAGNOSIS — D631 Anemia in chronic kidney disease: Secondary | ICD-10-CM | POA: Diagnosis not present

## 2023-02-01 DIAGNOSIS — Y835 Amputation of limb(s) as the cause of abnormal reaction of the patient, or of later complication, without mention of misadventure at the time of the procedure: Secondary | ICD-10-CM | POA: Diagnosis not present

## 2023-02-01 DIAGNOSIS — M6259 Muscle wasting and atrophy, not elsewhere classified, multiple sites: Secondary | ICD-10-CM | POA: Diagnosis not present

## 2023-02-01 DIAGNOSIS — Z992 Dependence on renal dialysis: Secondary | ICD-10-CM | POA: Diagnosis not present

## 2023-02-01 DIAGNOSIS — Z4781 Encounter for orthopedic aftercare following surgical amputation: Secondary | ICD-10-CM | POA: Diagnosis not present

## 2023-02-02 DIAGNOSIS — N186 End stage renal disease: Secondary | ICD-10-CM | POA: Diagnosis not present

## 2023-02-02 DIAGNOSIS — N2581 Secondary hyperparathyroidism of renal origin: Secondary | ICD-10-CM | POA: Diagnosis not present

## 2023-02-02 DIAGNOSIS — Z992 Dependence on renal dialysis: Secondary | ICD-10-CM | POA: Diagnosis not present

## 2023-02-02 DIAGNOSIS — E875 Hyperkalemia: Secondary | ICD-10-CM | POA: Diagnosis not present

## 2023-02-02 DIAGNOSIS — D509 Iron deficiency anemia, unspecified: Secondary | ICD-10-CM | POA: Diagnosis not present

## 2023-02-02 DIAGNOSIS — D631 Anemia in chronic kidney disease: Secondary | ICD-10-CM | POA: Diagnosis not present

## 2023-02-03 DIAGNOSIS — Y835 Amputation of limb(s) as the cause of abnormal reaction of the patient, or of later complication, without mention of misadventure at the time of the procedure: Secondary | ICD-10-CM | POA: Diagnosis not present

## 2023-02-03 DIAGNOSIS — Z4781 Encounter for orthopedic aftercare following surgical amputation: Secondary | ICD-10-CM | POA: Diagnosis not present

## 2023-02-03 DIAGNOSIS — Z992 Dependence on renal dialysis: Secondary | ICD-10-CM | POA: Diagnosis not present

## 2023-02-03 DIAGNOSIS — M6259 Muscle wasting and atrophy, not elsewhere classified, multiple sites: Secondary | ICD-10-CM | POA: Diagnosis not present

## 2023-02-04 DIAGNOSIS — Y835 Amputation of limb(s) as the cause of abnormal reaction of the patient, or of later complication, without mention of misadventure at the time of the procedure: Secondary | ICD-10-CM | POA: Diagnosis not present

## 2023-02-04 DIAGNOSIS — Z4781 Encounter for orthopedic aftercare following surgical amputation: Secondary | ICD-10-CM | POA: Diagnosis not present

## 2023-02-04 DIAGNOSIS — Z992 Dependence on renal dialysis: Secondary | ICD-10-CM | POA: Diagnosis not present

## 2023-02-04 DIAGNOSIS — M6259 Muscle wasting and atrophy, not elsewhere classified, multiple sites: Secondary | ICD-10-CM | POA: Diagnosis not present

## 2023-02-05 DIAGNOSIS — Z4781 Encounter for orthopedic aftercare following surgical amputation: Secondary | ICD-10-CM | POA: Diagnosis not present

## 2023-02-05 DIAGNOSIS — M6259 Muscle wasting and atrophy, not elsewhere classified, multiple sites: Secondary | ICD-10-CM | POA: Diagnosis not present

## 2023-02-05 DIAGNOSIS — E875 Hyperkalemia: Secondary | ICD-10-CM | POA: Diagnosis not present

## 2023-02-05 DIAGNOSIS — N2581 Secondary hyperparathyroidism of renal origin: Secondary | ICD-10-CM | POA: Diagnosis not present

## 2023-02-05 DIAGNOSIS — N186 End stage renal disease: Secondary | ICD-10-CM | POA: Diagnosis not present

## 2023-02-05 DIAGNOSIS — D631 Anemia in chronic kidney disease: Secondary | ICD-10-CM | POA: Diagnosis not present

## 2023-02-05 DIAGNOSIS — Z992 Dependence on renal dialysis: Secondary | ICD-10-CM | POA: Diagnosis not present

## 2023-02-05 DIAGNOSIS — D509 Iron deficiency anemia, unspecified: Secondary | ICD-10-CM | POA: Diagnosis not present

## 2023-02-05 DIAGNOSIS — Y835 Amputation of limb(s) as the cause of abnormal reaction of the patient, or of later complication, without mention of misadventure at the time of the procedure: Secondary | ICD-10-CM | POA: Diagnosis not present

## 2023-02-06 DIAGNOSIS — Z992 Dependence on renal dialysis: Secondary | ICD-10-CM | POA: Diagnosis not present

## 2023-02-06 DIAGNOSIS — Y835 Amputation of limb(s) as the cause of abnormal reaction of the patient, or of later complication, without mention of misadventure at the time of the procedure: Secondary | ICD-10-CM | POA: Diagnosis not present

## 2023-02-06 DIAGNOSIS — Z4781 Encounter for orthopedic aftercare following surgical amputation: Secondary | ICD-10-CM | POA: Diagnosis not present

## 2023-02-06 DIAGNOSIS — M6259 Muscle wasting and atrophy, not elsewhere classified, multiple sites: Secondary | ICD-10-CM | POA: Diagnosis not present

## 2023-02-07 ENCOUNTER — Telehealth: Payer: Self-pay | Admitting: *Deleted

## 2023-02-07 DIAGNOSIS — N186 End stage renal disease: Secondary | ICD-10-CM | POA: Diagnosis not present

## 2023-02-07 DIAGNOSIS — D509 Iron deficiency anemia, unspecified: Secondary | ICD-10-CM | POA: Diagnosis not present

## 2023-02-07 DIAGNOSIS — N2581 Secondary hyperparathyroidism of renal origin: Secondary | ICD-10-CM | POA: Diagnosis not present

## 2023-02-07 DIAGNOSIS — D631 Anemia in chronic kidney disease: Secondary | ICD-10-CM | POA: Diagnosis not present

## 2023-02-07 DIAGNOSIS — E875 Hyperkalemia: Secondary | ICD-10-CM | POA: Diagnosis not present

## 2023-02-07 DIAGNOSIS — Z992 Dependence on renal dialysis: Secondary | ICD-10-CM | POA: Diagnosis not present

## 2023-02-07 NOTE — Progress Notes (Signed)
  Care Coordination   Note   02/07/2023 Name: [name] MRN: [id number] DOB: 04/06/1956  NUH MEEDS is a 67 y.o. year old male who sees Lonie Peak, [place] for primary care. I reached out to Chinita Pester by phone today to offer care coordination services.  Mr. Ferrigno was given information about Care Coordination services today including:   The Care Coordination services include support from the care team which includes your Nurse Coordinator, Clinical Social Worker, or Pharmacist.  The Care Coordination team is here to help remove barriers to the health concerns and goals most important to you. Care Coordination services are voluntary, and the patient may decline or stop services at any time by request to their care team member.   Care Coordination Consent Status: Patient agreed to services and verbal consent obtained.   Follow up plan:  Telephone appointment with care coordination team member scheduled for:  02/13/2023  Encounter Outcome:  Pt. Scheduled  Burman Nieves, CCMA Care Coordination Care Guide Direct Dial: [phone]

## 2023-02-08 DIAGNOSIS — N186 End stage renal disease: Secondary | ICD-10-CM | POA: Diagnosis not present

## 2023-02-08 DIAGNOSIS — E1122 Type 2 diabetes mellitus with diabetic chronic kidney disease: Secondary | ICD-10-CM | POA: Diagnosis not present

## 2023-02-08 DIAGNOSIS — Z992 Dependence on renal dialysis: Secondary | ICD-10-CM | POA: Diagnosis not present

## 2023-02-09 DIAGNOSIS — Z992 Dependence on renal dialysis: Secondary | ICD-10-CM | POA: Diagnosis not present

## 2023-02-09 DIAGNOSIS — D509 Iron deficiency anemia, unspecified: Secondary | ICD-10-CM | POA: Diagnosis not present

## 2023-02-09 DIAGNOSIS — E875 Hyperkalemia: Secondary | ICD-10-CM | POA: Diagnosis not present

## 2023-02-09 DIAGNOSIS — N186 End stage renal disease: Secondary | ICD-10-CM | POA: Diagnosis not present

## 2023-02-09 DIAGNOSIS — D631 Anemia in chronic kidney disease: Secondary | ICD-10-CM | POA: Diagnosis not present

## 2023-02-09 DIAGNOSIS — N2581 Secondary hyperparathyroidism of renal origin: Secondary | ICD-10-CM | POA: Diagnosis not present

## 2023-02-11 DIAGNOSIS — Z794 Long term (current) use of insulin: Secondary | ICD-10-CM | POA: Diagnosis not present

## 2023-02-11 DIAGNOSIS — Z720 Tobacco use: Secondary | ICD-10-CM | POA: Diagnosis not present

## 2023-02-11 DIAGNOSIS — T23221A Burn of second degree of single right finger (nail) except thumb, initial encounter: Secondary | ICD-10-CM | POA: Diagnosis not present

## 2023-02-11 DIAGNOSIS — F17200 Nicotine dependence, unspecified, uncomplicated: Secondary | ICD-10-CM | POA: Diagnosis not present

## 2023-02-11 DIAGNOSIS — Z4781 Encounter for orthopedic aftercare following surgical amputation: Secondary | ICD-10-CM | POA: Diagnosis not present

## 2023-02-11 DIAGNOSIS — E113512 Type 2 diabetes mellitus with proliferative diabetic retinopathy with macular edema, left eye: Secondary | ICD-10-CM | POA: Diagnosis not present

## 2023-02-11 DIAGNOSIS — E113511 Type 2 diabetes mellitus with proliferative diabetic retinopathy with macular edema, right eye: Secondary | ICD-10-CM | POA: Diagnosis not present

## 2023-02-11 DIAGNOSIS — Z992 Dependence on renal dialysis: Secondary | ICD-10-CM | POA: Diagnosis not present

## 2023-02-11 DIAGNOSIS — Y835 Amputation of limb(s) as the cause of abnormal reaction of the patient, or of later complication, without mention of misadventure at the time of the procedure: Secondary | ICD-10-CM | POA: Diagnosis not present

## 2023-02-11 DIAGNOSIS — M6259 Muscle wasting and atrophy, not elsewhere classified, multiple sites: Secondary | ICD-10-CM | POA: Diagnosis not present

## 2023-02-12 DIAGNOSIS — N2581 Secondary hyperparathyroidism of renal origin: Secondary | ICD-10-CM | POA: Diagnosis not present

## 2023-02-12 DIAGNOSIS — D631 Anemia in chronic kidney disease: Secondary | ICD-10-CM | POA: Diagnosis not present

## 2023-02-12 DIAGNOSIS — N186 End stage renal disease: Secondary | ICD-10-CM | POA: Diagnosis not present

## 2023-02-12 DIAGNOSIS — Z992 Dependence on renal dialysis: Secondary | ICD-10-CM | POA: Diagnosis not present

## 2023-02-12 DIAGNOSIS — D509 Iron deficiency anemia, unspecified: Secondary | ICD-10-CM | POA: Diagnosis not present

## 2023-02-12 DIAGNOSIS — E875 Hyperkalemia: Secondary | ICD-10-CM | POA: Diagnosis not present

## 2023-02-13 ENCOUNTER — Ambulatory Visit: Payer: Self-pay

## 2023-02-13 DIAGNOSIS — I10 Essential (primary) hypertension: Secondary | ICD-10-CM | POA: Diagnosis not present

## 2023-02-13 DIAGNOSIS — Z89512 Acquired absence of left leg below knee: Secondary | ICD-10-CM | POA: Diagnosis not present

## 2023-02-13 DIAGNOSIS — L89309 Pressure ulcer of unspecified buttock, unspecified stage: Secondary | ICD-10-CM | POA: Diagnosis not present

## 2023-02-13 DIAGNOSIS — I4891 Unspecified atrial fibrillation: Secondary | ICD-10-CM | POA: Diagnosis not present

## 2023-02-13 NOTE — Patient Outreach (Signed)
  Care Coordination   02/13/2023 Name: BROCH PELCHER MRN: [id number] DOB: Dec 14, 1955   Care Coordination Outreach Attempts:  An unsuccessful telephone outreach was attempted for a scheduled appointment today.  Follow Up Plan:  Additional outreach attempts will be made to offer the patient care coordination information and services.   Encounter Outcome:  No Answer   Care Coordination Interventions:  No, not indicated    Rowe Pavy, RN, BSN, [facility] [employer] [phone]

## 2023-02-14 DIAGNOSIS — D631 Anemia in chronic kidney disease: Secondary | ICD-10-CM | POA: Diagnosis not present

## 2023-02-14 DIAGNOSIS — Z992 Dependence on renal dialysis: Secondary | ICD-10-CM | POA: Diagnosis not present

## 2023-02-14 DIAGNOSIS — M6259 Muscle wasting and atrophy, not elsewhere classified, multiple sites: Secondary | ICD-10-CM | POA: Diagnosis not present

## 2023-02-14 DIAGNOSIS — M1389 Other specified arthritis, multiple sites: Secondary | ICD-10-CM | POA: Diagnosis not present

## 2023-02-14 DIAGNOSIS — G8929 Other chronic pain: Secondary | ICD-10-CM | POA: Diagnosis not present

## 2023-02-14 DIAGNOSIS — D509 Iron deficiency anemia, unspecified: Secondary | ICD-10-CM | POA: Diagnosis not present

## 2023-02-14 DIAGNOSIS — E875 Hyperkalemia: Secondary | ICD-10-CM | POA: Diagnosis not present

## 2023-02-14 DIAGNOSIS — Y835 Amputation of limb(s) as the cause of abnormal reaction of the patient, or of later complication, without mention of misadventure at the time of the procedure: Secondary | ICD-10-CM | POA: Diagnosis not present

## 2023-02-14 DIAGNOSIS — N186 End stage renal disease: Secondary | ICD-10-CM | POA: Diagnosis not present

## 2023-02-14 DIAGNOSIS — N2581 Secondary hyperparathyroidism of renal origin: Secondary | ICD-10-CM | POA: Diagnosis not present

## 2023-02-14 DIAGNOSIS — Z4781 Encounter for orthopedic aftercare following surgical amputation: Secondary | ICD-10-CM | POA: Diagnosis not present

## 2023-02-15 DIAGNOSIS — Z992 Dependence on renal dialysis: Secondary | ICD-10-CM | POA: Diagnosis not present

## 2023-02-15 DIAGNOSIS — Z4781 Encounter for orthopedic aftercare following surgical amputation: Secondary | ICD-10-CM | POA: Diagnosis not present

## 2023-02-15 DIAGNOSIS — M6259 Muscle wasting and atrophy, not elsewhere classified, multiple sites: Secondary | ICD-10-CM | POA: Diagnosis not present

## 2023-02-15 DIAGNOSIS — Y835 Amputation of limb(s) as the cause of abnormal reaction of the patient, or of later complication, without mention of misadventure at the time of the procedure: Secondary | ICD-10-CM | POA: Diagnosis not present

## 2023-02-16 DIAGNOSIS — Z992 Dependence on renal dialysis: Secondary | ICD-10-CM | POA: Diagnosis not present

## 2023-02-16 DIAGNOSIS — D631 Anemia in chronic kidney disease: Secondary | ICD-10-CM | POA: Diagnosis not present

## 2023-02-16 DIAGNOSIS — N186 End stage renal disease: Secondary | ICD-10-CM | POA: Diagnosis not present

## 2023-02-16 DIAGNOSIS — E875 Hyperkalemia: Secondary | ICD-10-CM | POA: Diagnosis not present

## 2023-02-16 DIAGNOSIS — M6259 Muscle wasting and atrophy, not elsewhere classified, multiple sites: Secondary | ICD-10-CM | POA: Diagnosis not present

## 2023-02-16 DIAGNOSIS — N2581 Secondary hyperparathyroidism of renal origin: Secondary | ICD-10-CM | POA: Diagnosis not present

## 2023-02-16 DIAGNOSIS — D509 Iron deficiency anemia, unspecified: Secondary | ICD-10-CM | POA: Diagnosis not present

## 2023-02-16 DIAGNOSIS — Y835 Amputation of limb(s) as the cause of abnormal reaction of the patient, or of later complication, without mention of misadventure at the time of the procedure: Secondary | ICD-10-CM | POA: Diagnosis not present

## 2023-02-16 DIAGNOSIS — Z4781 Encounter for orthopedic aftercare following surgical amputation: Secondary | ICD-10-CM | POA: Diagnosis not present

## 2023-02-18 DIAGNOSIS — M6259 Muscle wasting and atrophy, not elsewhere classified, multiple sites: Secondary | ICD-10-CM | POA: Diagnosis not present

## 2023-02-18 DIAGNOSIS — Y835 Amputation of limb(s) as the cause of abnormal reaction of the patient, or of later complication, without mention of misadventure at the time of the procedure: Secondary | ICD-10-CM | POA: Diagnosis not present

## 2023-02-18 DIAGNOSIS — Z992 Dependence on renal dialysis: Secondary | ICD-10-CM | POA: Diagnosis not present

## 2023-02-18 DIAGNOSIS — Z4781 Encounter for orthopedic aftercare following surgical amputation: Secondary | ICD-10-CM | POA: Diagnosis not present

## 2023-02-19 DIAGNOSIS — M6259 Muscle wasting and atrophy, not elsewhere classified, multiple sites: Secondary | ICD-10-CM | POA: Diagnosis not present

## 2023-02-19 DIAGNOSIS — Z992 Dependence on renal dialysis: Secondary | ICD-10-CM | POA: Diagnosis not present

## 2023-02-19 DIAGNOSIS — Z4781 Encounter for orthopedic aftercare following surgical amputation: Secondary | ICD-10-CM | POA: Diagnosis not present

## 2023-02-19 DIAGNOSIS — Y835 Amputation of limb(s) as the cause of abnormal reaction of the patient, or of later complication, without mention of misadventure at the time of the procedure: Secondary | ICD-10-CM | POA: Diagnosis not present

## 2023-02-21 DIAGNOSIS — D631 Anemia in chronic kidney disease: Secondary | ICD-10-CM | POA: Diagnosis not present

## 2023-02-21 DIAGNOSIS — N186 End stage renal disease: Secondary | ICD-10-CM | POA: Diagnosis not present

## 2023-02-21 DIAGNOSIS — N2581 Secondary hyperparathyroidism of renal origin: Secondary | ICD-10-CM | POA: Diagnosis not present

## 2023-02-21 DIAGNOSIS — E875 Hyperkalemia: Secondary | ICD-10-CM | POA: Diagnosis not present

## 2023-02-21 DIAGNOSIS — D509 Iron deficiency anemia, unspecified: Secondary | ICD-10-CM | POA: Diagnosis not present

## 2023-02-21 DIAGNOSIS — Z992 Dependence on renal dialysis: Secondary | ICD-10-CM | POA: Diagnosis not present

## 2023-02-22 DIAGNOSIS — Z4781 Encounter for orthopedic aftercare following surgical amputation: Secondary | ICD-10-CM | POA: Diagnosis not present

## 2023-02-22 DIAGNOSIS — Z992 Dependence on renal dialysis: Secondary | ICD-10-CM | POA: Diagnosis not present

## 2023-02-22 DIAGNOSIS — Y835 Amputation of limb(s) as the cause of abnormal reaction of the patient, or of later complication, without mention of misadventure at the time of the procedure: Secondary | ICD-10-CM | POA: Diagnosis not present

## 2023-02-22 DIAGNOSIS — M6259 Muscle wasting and atrophy, not elsewhere classified, multiple sites: Secondary | ICD-10-CM | POA: Diagnosis not present

## 2023-02-23 DIAGNOSIS — D509 Iron deficiency anemia, unspecified: Secondary | ICD-10-CM | POA: Diagnosis not present

## 2023-02-23 DIAGNOSIS — N2581 Secondary hyperparathyroidism of renal origin: Secondary | ICD-10-CM | POA: Diagnosis not present

## 2023-02-23 DIAGNOSIS — N186 End stage renal disease: Secondary | ICD-10-CM | POA: Diagnosis not present

## 2023-02-23 DIAGNOSIS — E875 Hyperkalemia: Secondary | ICD-10-CM | POA: Diagnosis not present

## 2023-02-23 DIAGNOSIS — D631 Anemia in chronic kidney disease: Secondary | ICD-10-CM | POA: Diagnosis not present

## 2023-02-23 DIAGNOSIS — Z992 Dependence on renal dialysis: Secondary | ICD-10-CM | POA: Diagnosis not present

## 2023-02-27 ENCOUNTER — Ambulatory Visit: Payer: Self-pay

## 2023-02-27 NOTE — Patient Outreach (Signed)
  Care Coordination   02/27/2023 Name: [name] MRN: [id number] DOB: 1956/03/13   Care Coordination Outreach Attempts:  An unsuccessful telephone outreach was attempted for a scheduled appointment today.  Follow Up Plan:  Additional outreach attempts will be made to offer the patient care coordination information and services.   Encounter Outcome:  No Answer   Care Coordination Interventions:  No, not indicated    Rowe Pavy, RN, BSN, [facility] [employer] [phone]

## 2023-02-28 DIAGNOSIS — N186 End stage renal disease: Secondary | ICD-10-CM | POA: Diagnosis not present

## 2023-02-28 DIAGNOSIS — N2581 Secondary hyperparathyroidism of renal origin: Secondary | ICD-10-CM | POA: Diagnosis not present

## 2023-02-28 DIAGNOSIS — D631 Anemia in chronic kidney disease: Secondary | ICD-10-CM | POA: Diagnosis not present

## 2023-02-28 DIAGNOSIS — Z992 Dependence on renal dialysis: Secondary | ICD-10-CM | POA: Diagnosis not present

## 2023-02-28 DIAGNOSIS — D509 Iron deficiency anemia, unspecified: Secondary | ICD-10-CM | POA: Diagnosis not present

## 2023-02-28 DIAGNOSIS — E875 Hyperkalemia: Secondary | ICD-10-CM | POA: Diagnosis not present

## 2023-03-02 DIAGNOSIS — E875 Hyperkalemia: Secondary | ICD-10-CM | POA: Diagnosis not present

## 2023-03-02 DIAGNOSIS — Z992 Dependence on renal dialysis: Secondary | ICD-10-CM | POA: Diagnosis not present

## 2023-03-02 DIAGNOSIS — D509 Iron deficiency anemia, unspecified: Secondary | ICD-10-CM | POA: Diagnosis not present

## 2023-03-02 DIAGNOSIS — N186 End stage renal disease: Secondary | ICD-10-CM | POA: Diagnosis not present

## 2023-03-02 DIAGNOSIS — D631 Anemia in chronic kidney disease: Secondary | ICD-10-CM | POA: Diagnosis not present

## 2023-03-02 DIAGNOSIS — N2581 Secondary hyperparathyroidism of renal origin: Secondary | ICD-10-CM | POA: Diagnosis not present

## 2023-03-05 DIAGNOSIS — Z992 Dependence on renal dialysis: Secondary | ICD-10-CM | POA: Diagnosis not present

## 2023-03-05 DIAGNOSIS — N186 End stage renal disease: Secondary | ICD-10-CM | POA: Diagnosis not present

## 2023-03-05 DIAGNOSIS — N2581 Secondary hyperparathyroidism of renal origin: Secondary | ICD-10-CM | POA: Diagnosis not present

## 2023-03-05 DIAGNOSIS — E875 Hyperkalemia: Secondary | ICD-10-CM | POA: Diagnosis not present

## 2023-03-05 DIAGNOSIS — D631 Anemia in chronic kidney disease: Secondary | ICD-10-CM | POA: Diagnosis not present

## 2023-03-05 DIAGNOSIS — D509 Iron deficiency anemia, unspecified: Secondary | ICD-10-CM | POA: Diagnosis not present

## 2023-03-09 DIAGNOSIS — N186 End stage renal disease: Secondary | ICD-10-CM | POA: Diagnosis not present

## 2023-03-09 DIAGNOSIS — E875 Hyperkalemia: Secondary | ICD-10-CM | POA: Diagnosis not present

## 2023-03-09 DIAGNOSIS — N2581 Secondary hyperparathyroidism of renal origin: Secondary | ICD-10-CM | POA: Diagnosis not present

## 2023-03-09 DIAGNOSIS — Z992 Dependence on renal dialysis: Secondary | ICD-10-CM | POA: Diagnosis not present

## 2023-03-09 DIAGNOSIS — D509 Iron deficiency anemia, unspecified: Secondary | ICD-10-CM | POA: Diagnosis not present

## 2023-03-09 DIAGNOSIS — D631 Anemia in chronic kidney disease: Secondary | ICD-10-CM | POA: Diagnosis not present

## 2023-03-10 DIAGNOSIS — N186 End stage renal disease: Secondary | ICD-10-CM | POA: Diagnosis not present

## 2023-03-10 DIAGNOSIS — E1122 Type 2 diabetes mellitus with diabetic chronic kidney disease: Secondary | ICD-10-CM | POA: Diagnosis not present

## 2023-03-10 DIAGNOSIS — Z992 Dependence on renal dialysis: Secondary | ICD-10-CM | POA: Diagnosis not present

## 2023-03-14 DIAGNOSIS — E875 Hyperkalemia: Secondary | ICD-10-CM | POA: Diagnosis not present

## 2023-03-14 DIAGNOSIS — N186 End stage renal disease: Secondary | ICD-10-CM | POA: Diagnosis not present

## 2023-03-14 DIAGNOSIS — Z992 Dependence on renal dialysis: Secondary | ICD-10-CM | POA: Diagnosis not present

## 2023-03-14 DIAGNOSIS — N2581 Secondary hyperparathyroidism of renal origin: Secondary | ICD-10-CM | POA: Diagnosis not present

## 2023-03-14 DIAGNOSIS — D509 Iron deficiency anemia, unspecified: Secondary | ICD-10-CM | POA: Diagnosis not present

## 2023-03-14 DIAGNOSIS — T7840XA Allergy, unspecified, initial encounter: Secondary | ICD-10-CM | POA: Diagnosis not present

## 2023-03-14 DIAGNOSIS — D631 Anemia in chronic kidney disease: Secondary | ICD-10-CM | POA: Diagnosis not present

## 2023-03-14 DIAGNOSIS — E1122 Type 2 diabetes mellitus with diabetic chronic kidney disease: Secondary | ICD-10-CM | POA: Diagnosis not present

## 2023-03-15 DIAGNOSIS — Z4781 Encounter for orthopedic aftercare following surgical amputation: Secondary | ICD-10-CM | POA: Diagnosis not present

## 2023-03-15 DIAGNOSIS — Z89512 Acquired absence of left leg below knee: Secondary | ICD-10-CM | POA: Diagnosis not present

## 2023-03-15 DIAGNOSIS — I482 Chronic atrial fibrillation, unspecified: Secondary | ICD-10-CM | POA: Diagnosis not present

## 2023-03-15 DIAGNOSIS — N319 Neuromuscular dysfunction of bladder, unspecified: Secondary | ICD-10-CM | POA: Diagnosis not present

## 2023-03-16 DIAGNOSIS — D509 Iron deficiency anemia, unspecified: Secondary | ICD-10-CM | POA: Diagnosis not present

## 2023-03-16 DIAGNOSIS — N186 End stage renal disease: Secondary | ICD-10-CM | POA: Diagnosis not present

## 2023-03-16 DIAGNOSIS — E1122 Type 2 diabetes mellitus with diabetic chronic kidney disease: Secondary | ICD-10-CM | POA: Diagnosis not present

## 2023-03-16 DIAGNOSIS — D631 Anemia in chronic kidney disease: Secondary | ICD-10-CM | POA: Diagnosis not present

## 2023-03-16 DIAGNOSIS — E875 Hyperkalemia: Secondary | ICD-10-CM | POA: Diagnosis not present

## 2023-03-16 DIAGNOSIS — T7840XA Allergy, unspecified, initial encounter: Secondary | ICD-10-CM | POA: Diagnosis not present

## 2023-03-16 DIAGNOSIS — Z992 Dependence on renal dialysis: Secondary | ICD-10-CM | POA: Diagnosis not present

## 2023-03-16 DIAGNOSIS — N2581 Secondary hyperparathyroidism of renal origin: Secondary | ICD-10-CM | POA: Diagnosis not present

## 2023-03-19 DIAGNOSIS — N186 End stage renal disease: Secondary | ICD-10-CM | POA: Diagnosis not present

## 2023-03-19 DIAGNOSIS — Z91158 Patient's noncompliance with renal dialysis for other reason: Secondary | ICD-10-CM | POA: Diagnosis not present

## 2023-03-20 ENCOUNTER — Ambulatory Visit: Payer: Medicare HMO | Admitting: Cardiology

## 2023-03-21 ENCOUNTER — Telehealth: Payer: Self-pay | Admitting: *Deleted

## 2023-03-21 DIAGNOSIS — N186 End stage renal disease: Secondary | ICD-10-CM | POA: Diagnosis not present

## 2023-03-21 DIAGNOSIS — E1122 Type 2 diabetes mellitus with diabetic chronic kidney disease: Secondary | ICD-10-CM | POA: Diagnosis not present

## 2023-03-21 DIAGNOSIS — D631 Anemia in chronic kidney disease: Secondary | ICD-10-CM | POA: Diagnosis not present

## 2023-03-21 DIAGNOSIS — D509 Iron deficiency anemia, unspecified: Secondary | ICD-10-CM | POA: Diagnosis not present

## 2023-03-21 DIAGNOSIS — Z992 Dependence on renal dialysis: Secondary | ICD-10-CM | POA: Diagnosis not present

## 2023-03-21 DIAGNOSIS — T7840XA Allergy, unspecified, initial encounter: Secondary | ICD-10-CM | POA: Diagnosis not present

## 2023-03-21 DIAGNOSIS — N2581 Secondary hyperparathyroidism of renal origin: Secondary | ICD-10-CM | POA: Diagnosis not present

## 2023-03-21 DIAGNOSIS — E875 Hyperkalemia: Secondary | ICD-10-CM | POA: Diagnosis not present

## 2023-03-21 NOTE — Progress Notes (Signed)
  Care Coordination Note  03/21/2023 Name: [name] MRN: [id number] DOB: 01/22/1956  [name] is a 67 y.o. year old male who is a primary care patient of Arlyss Queen and is actively engaged with the care management team. I reached out to Chinita Pester by phone today to assist with re-scheduling an initial visit with the RN Case Manager  Follow up plan: We have been unable to make contact with the patient for follow up.   Burman Nieves, CCMA Care Coordination Care Guide Direct Dial: [phone]

## 2023-03-23 DIAGNOSIS — T7840XA Allergy, unspecified, initial encounter: Secondary | ICD-10-CM | POA: Diagnosis not present

## 2023-03-23 DIAGNOSIS — E875 Hyperkalemia: Secondary | ICD-10-CM | POA: Diagnosis not present

## 2023-03-23 DIAGNOSIS — D631 Anemia in chronic kidney disease: Secondary | ICD-10-CM | POA: Diagnosis not present

## 2023-03-23 DIAGNOSIS — N186 End stage renal disease: Secondary | ICD-10-CM | POA: Diagnosis not present

## 2023-03-23 DIAGNOSIS — D509 Iron deficiency anemia, unspecified: Secondary | ICD-10-CM | POA: Diagnosis not present

## 2023-03-23 DIAGNOSIS — N2581 Secondary hyperparathyroidism of renal origin: Secondary | ICD-10-CM | POA: Diagnosis not present

## 2023-03-23 DIAGNOSIS — Z992 Dependence on renal dialysis: Secondary | ICD-10-CM | POA: Diagnosis not present

## 2023-03-23 DIAGNOSIS — E1122 Type 2 diabetes mellitus with diabetic chronic kidney disease: Secondary | ICD-10-CM | POA: Diagnosis not present

## 2023-03-26 DIAGNOSIS — N186 End stage renal disease: Secondary | ICD-10-CM | POA: Diagnosis not present

## 2023-03-26 DIAGNOSIS — Z91158 Patient's noncompliance with renal dialysis for other reason: Secondary | ICD-10-CM | POA: Diagnosis not present

## 2023-03-27 ENCOUNTER — Encounter: Payer: Self-pay | Admitting: Cardiology

## 2023-03-27 ENCOUNTER — Ambulatory Visit: Payer: Medicare HMO | Admitting: Cardiology

## 2023-03-27 VITALS — BP 124/63 | HR 75 | Resp 16 | Ht 74.0 in | Wt 250.0 lb

## 2023-03-27 DIAGNOSIS — I10 Essential (primary) hypertension: Secondary | ICD-10-CM | POA: Diagnosis not present

## 2023-03-27 DIAGNOSIS — N186 End stage renal disease: Secondary | ICD-10-CM

## 2023-03-27 DIAGNOSIS — I453 Trifascicular block: Secondary | ICD-10-CM | POA: Diagnosis not present

## 2023-03-27 DIAGNOSIS — I480 Paroxysmal atrial fibrillation: Secondary | ICD-10-CM

## 2023-03-27 DIAGNOSIS — I25118 Atherosclerotic heart disease of native coronary artery with other forms of angina pectoris: Secondary | ICD-10-CM | POA: Diagnosis not present

## 2023-03-27 DIAGNOSIS — Z992 Dependence on renal dialysis: Secondary | ICD-10-CM | POA: Diagnosis not present

## 2023-03-27 NOTE — Progress Notes (Signed)
Primary Physician/Referring:  Lonie Peak, PA-C  Patient ID: [name], male    DOB: [birth year], 67 y.o.   MRN: [id number]  Chief Complaint  Patient presents with   Coronary artery disease of native artery of native heart wi   Follow-up    2  months    HPI:    [name]  is a 67 y.o. Caucasian male patient with severe peripheral arterial disease and has history of bilateral below-knee amputation, multiple peripheral interventions in the past, uncontrolled diabetes mellitus, ESRD, severe diabetic retinopathy, coronary artery disease angioplasty to the right coronary artery on 10/29/2014, right carotid endarterectomy in 2012 , OSA Unable to tolerate CPAP, chronic back pain and neck and back surgery in past and on chronic pain medications, paroxysmal atrial fibrillation on Eliquis.  Patient is presently in the rehab facility since November 2023 as after amputation, his stump took long time to heal.  Both the stumps have healed well and he is now being trained for using prosthesis. He is presently in palliative care.  Had seen him 2 months ago for recurrence of angina pectoris.  I started him on isosorbide mononitrate he now presents for follow-up.  States that he has not had any recurrence of chest pain except after he saw me he took sublingual nitroglycerin twice and no recurrence since then.  Otherwise remains asymptomatic.  Unfortunately still smoking.  Social History   Tobacco Use   Smoking status: Every Day    Current packs/day: 0.50    Average packs/day: 0.5 packs/day for 35.0 years (17.5 ttl pk-yrs)    Types: Cigarettes   Smokeless tobacco: Never  Substance Use Topics   Alcohol use: Not Currently    Comment: "not in a long time"   Marital Status: Married   ROS  Review of Systems  Cardiovascular:  Negative for chest pain, dyspnea on exertion and palpitations.  Neurological:  Negative for dizziness.   Objective      03/27/2023    2:55 PM 01/21/2023    2:09 PM  11/23/2022   11:30 AM  Vitals with BMI  Height 6\' 2"     Weight 250 lbs 250 lbs 2 oz   BMI 32.08    Systolic 124 152 696  Diastolic 63 63 70  Pulse 75 71 91    Blood pressure 124/63, pulse 75, resp. rate 16, height 6\' 2"  (1.88 m), weight 250 lb (113.4 kg), SpO2 94%. Body mass index is 32.1 kg/m.   Physical Exam Vitals reviewed.  Constitutional:      Appearance: He is obese.     Comments: Well built and moderately obese in no acute distress  Neck:     Vascular: Carotid bruit (bilateral) present. No JVD.  Cardiovascular:     Rate and Rhythm: Normal rate and regular rhythm.     Pulses:          Femoral pulses are 1+ on the right side and 1+ on the left side.       Right popliteal pulse not accessible and left popliteal pulse not accessible.     Heart sounds: Murmur heard.     No gallop.     Comments: Left brachial artery AV Shunt noted. Venous hum heard over the precordium. Pulmonary:     Effort: Pulmonary effort is normal.     Breath sounds: Normal breath sounds.  Abdominal:     General: Abdomen is flat.     Palpations: Abdomen is soft.  Musculoskeletal:  General: Deformity (bilataral BKA) present.    Laboratory examination:   Recent Labs    11/20/22 0437 11/21/22 1040 11/22/22 0309  NA 134* 136 135  K 3.8 3.9 3.5  CL 96* 96* 95*  CO2 23 27 25   GLUCOSE 241* 216* 160*  BUN 49* 33* 43*  CREATININE 7.12* 5.17* 5.99*  CALCIUM 7.9* 8.6* 8.7*  GFRNONAA 8* 11* 10*      Latest Ref Rng & Units 11/22/2022    3:09 AM 11/21/2022   10:40 AM 11/20/2022    4:37 AM  CMP  Glucose 70 - 99 mg/dL 161  096  045   BUN 8 - 23 mg/dL 43  33  49   Creatinine 0.61 - 1.24 mg/dL 4.09  8.11  9.14   Sodium 135 - 145 mmol/L 135  136  134   Potassium 3.5 - 5.1 mmol/L 3.5  3.9  3.8   Chloride 98 - 111 mmol/L 95  96  96   CO2 22 - 32 mmol/L 25  27  23    Calcium 8.9 - 10.3 mg/dL 8.7  8.6  7.9       Latest Ref Rng & Units 11/21/2022   10:40 AM 11/20/2022    4:37 AM 11/19/2022    8:58  PM  CBC  WBC 4.0 - 10.5 K/uL 7.5  10.1  10.6   Hemoglobin 13.0 - 17.0 g/dL 78.2  9.5  95.6   Hematocrit 39.0 - 52.0 % 31.6  29.5  33.6   Platelets 150 - 400 K/uL 194  169  202    Lipid Panel     Component Value Date/Time   CHOL 117 05/30/2020 1646   TRIG 133 05/30/2020 1646   HDL 24 (L) 05/30/2020 1646   LDLCALC 69 05/30/2020 1646   HEMOGLOBIN A1C Lab Results  Component Value Date   HGBA1C 7.0 (H) 11/20/2022   MPG 154 11/20/2022   Cardiac Studies:   Coronary angiogram 09/17/18: Widely patent stent stents. Normal LVEF. Coronary Angiogram 10/29/2014: CAD s/p stenting of the distal, mid and proximal RCA with implantation of 3 overlapping drug-eluting stent, from distal to proximal 2.5 x 38 mm, 2.75 x 38 mm and a 2.75 x 20 mm Promus premier DES.   Carotid artery duplex  06/09/2019:  Minimal stenosis in bilateral ICA of 1-15%. Heterogeneous plaque. Stenosis  in the right external carotid artery (<50%).  Left carotid endarterectomy site is patent.  Right vertebral artery flow is not well visualized. Antegrade left  vertebral artery flow.  No significant change from 03/01/2017. Follow up studies when clinically  Indicated.  Echocardiogram 06/25/2021: 1. Left ventricular ejection fraction, by estimation, is 55 to 60%. The left ventricle has normal function. The left ventricle has no regional wall motion abnormalities. There is mild concentric left ventricular hypertrophy. Left ventricular diastolic parameters are indeterminate.   2. Right ventricular systolic function is normal. The right ventricular size is normal.   3. Left atrial size was moderately dilated.   4. The mitral valve is normal in structure. No evidence of mitral valve regurgitation. Moderate mitral annular calcification.   5. The aortic valve is tricuspid. Aortic valve regurgitation is not visualized. Mild to moderate aortic valve sclerosis/calcification is present, without any evidence of aortic stenosis.  EKG:   EKG  01/21/2023: Sinus rhythm with first-degree block at that of 68 bpm, left atrial enlargement, left axis deviation, left anterior fascicular block.  Right bundle branch block.  Trifascicular block.  LVH.  Nonspecific T abnormality.  Compared to 10/29/2022, no significant change.  06/12/2021: Atypical atrial flutter with RVR at the rate of 106 bpm, left axis deviation, left anterior fascicular block.  Right bundle branch block.  Nonspecific T abnormality.  Allergies   Allergies  Allergen Reactions   Contrast Media [Iodinated Contrast Media] Shortness Of Breath and Other (See Comments)    Difficulty breathing and altered mental status     Ivp Dye [Iodinated Contrast Media] Anaphylaxis, Shortness Of Breath and Other (See Comments)    Breathing problems, altered mental state    Adhesive [Tape] Rash and Other (See Comments)    Rash after 1 day of use Do not leave on longer then 3 days with out be changed   Latex Rash and Other (See Comments)    A severe rash appears after the first 24 hours of being placed    Current Outpatient Medications:    amiodarone (PACERONE) 200 MG tablet, Take 1 tablet (200 mg total) by mouth daily. (Patient taking differently: Take 100 mg by mouth daily.), Disp: 100 tablet, Rfl: 1   amLODipine (NORVASC) 10 MG tablet, Take 1 tablet (10 mg total) by mouth daily., Disp: 90 tablet, Rfl: 1   apixaban (ELIQUIS) 5 MG TABS tablet, Take 1 tablet (5 mg total) by mouth 2 (two) times daily., Disp: 60 tablet, Rfl: 1   atorvastatin (LIPITOR) 40 MG tablet, Take 40 mg by mouth at bedtime. , Disp: , Rfl:    B Complex-C-Zn-Folic Acid (DIALYVITE/ZINC) TABS, Take 1 tablet by mouth Every Tuesday,Thursday,and Saturday with dialysis., Disp: , Rfl:    famotidine (PEPCID) 20 MG tablet, Take 20 mg by mouth every other day., Disp: , Rfl:    fentaNYL (DURAGESIC) 50 MCG/HR, Place 1 patch onto the skin every 3 (three) days., Disp: 5 patch, Rfl: 0   folic acid (FOLVITE) 1 MG tablet, Take 1 mg by mouth  daily., Disp: , Rfl:    Glucosamine HCl (GLUCOSAMINE PO), Take 500 mg by mouth 2 (two) times daily., Disp: , Rfl:    insulin aspart (NOVOLOG FLEXPEN) 100 UNIT/ML FlexPen, Inject 0-12 Units into the skin See admin instructions. Inject as per sliding scale: If 0 - 69 = 0 initiate hypoglycemia orders;  70 - 150 = 0;  151 - 200 = 2;  201 - 250 = 4;  251 - 300 = 6;  301 - 350 = 8;  351 - 400 = 10;  401 - 450 = 12.  If >400, give 12 units, recheck in 1 hour. If still >400, call MD/NP for further orders. Subcutaneously with meals in addition to scheduled 4 unites with meals., Disp: , Rfl:    insulin glargine (LANTUS) 100 UNIT/ML injection, Inject 0.2 mLs (20 Units total) into the skin at bedtime., Disp: 10 mL, Rfl: 0   isosorbide mononitrate (IMDUR) 60 MG 24 hr tablet, Take 1 tablet (60 mg total) by mouth daily., Disp: 90 tablet, Rfl: 1   lidocaine-prilocaine (EMLA) cream, Apply 1 application topically See admin instructions. Prior to Dialysis days Tuesday,Thursday and saturday, Disp: , Rfl:    loratadine (CLARITIN) 10 MG tablet, Take 10 mg by mouth daily., Disp: , Rfl:    metoCLOPramide (REGLAN) 5 MG tablet, Take 1 tablet (5 mg total) by mouth 4 (four) times daily -  before meals and at bedtime. (Patient taking differently: Take 5 mg by mouth in the morning, at noon, in the evening, and at bedtime.), Disp: 360 tablet, Rfl: 1   NEEDLE, REUSABLE, 22 G 22G X 1-1/2"  MISC, 1 Units by Does not apply route as directed. For B12 IM inj, Disp: 10 each, Rfl: 0   nitroGLYCERIN (NITROSTAT) 0.4 MG SL tablet, Place 0.4 mg under the tongue every 5 (five) minutes as needed for chest pain., Disp: , Rfl:    omega-3 acid ethyl esters (LOVAZA) 1 g capsule, Take 2 g by mouth 2 (two) times daily., Disp: , Rfl:    pantoprazole (PROTONIX) 40 MG tablet, Take 1 tablet (40 mg total) by mouth 2 (two) times daily for 30 days, THEN 1 tablet (40 mg total) daily. (Patient taking differently:  1 tablet (40 mg total) twice daily.), Disp: 120  tablet, Rfl: 0   Polyvinyl Alcohol-Povidone PF 1.4-0.6 % SOLN, Place 1 drop into both eyes 2 (two) times daily as needed (dry eyes). , Disp: , Rfl:    pregabalin (LYRICA) 100 MG capsule, Take 1 capsule (100 mg total) by mouth 2 (two) times daily for 5 days., Disp: 10 capsule, Rfl: 0   rOPINIRole (REQUIP) 2 MG tablet, Take 2 mg by mouth at bedtime., Disp: , Rfl:    senna-docusate (SENOKOT-S) 8.6-50 MG tablet, Take 2 tablets by mouth at bedtime., Disp: , Rfl:    Syringe/Needle, Disp, (SYRINGE 3CC/22GX1-1/2") 22G X 1-1/2" 3 ML MISC, 1 Syringe by Does not apply route as directed. For b12 IM inj, Disp: 10 each, Rfl: 0   tamsulosin (FLOMAX) 0.4 MG CAPS capsule, Take 0.4 mg by mouth daily., Disp: , Rfl:    Assessment     ICD-10-CM   1. Coronary artery disease of native artery of native heart with stable angina pectoris (HCC)  I25.118     2. Primary hypertension  I10     3. 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 Paroxysmal atrial fibrillation (HCC)  I48.0        No orders of the defined types were placed in this encounter.   There are no discontinued medications.  CHA2DS2-VASc Score is 4.  Yearly risk of stroke: 5% (A, HTN, Vasc Dz).  Score of 1=0.6; 2=2.2; 3=3.2; 4=4.8; 5=7.2; 6=9.8; 7=>9.8) -(CHF; HTN; vasc disease DM,  Male = 1; Age <65 =0; 65-74 = 1,  >75 =2; stroke/embolism= 2).     Recommendations:   [name]  is a 67 y.o. Caucasian male patient with severe peripheral arterial disease and has history of bilateral below-knee amputation, multiple peripheral interventions in the past, uncontrolled diabetes mellitus, ESRD, severe diabetic retinopathy, coronary artery disease angioplasty to the right coronary artery on 10/29/2014, right carotid endarterectomy in 2012 , OSA Unable to tolerate CPAP, chronic back pain and neck and back surgery in past and on chronic pain medications, paroxysmal atrial fibrillation on Eliquis.  1. Coronary  artery disease of native artery of native heart with stable angina pectoris (HCC) On his last office visit about 2 months ago, I had started him on isosorbide mononitrate which he is tolerating since then he has noticed improvement in his anginal symptoms.  He has used sublingual nitroglycerin twice since I last saw him.  For now continue present medical therapy.  I do not plan on cardiac catheterization unless his anginal symptoms are worse.  2. Primary hypertension Blood pressure is well-controlled, in fact he is not on any medications during dialysis days.  No changes in the medications were done today.  3. ESRD on hemodialysis [facility]) Patient is present on hemodialysis.  4.  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  No indication for pacemaker.  5. Paroxysmal atrial fibrillation [facility]) Patient has paroxysmal atrial fibrillation, he is maintaining sinus rhythm, presently on Eliquis, continue the same.  I will see him back in 6 months or sooner if problems.    Yates Decamp, MD, [facility] 03/27/2023, 3:39 PM Office: [phone] Fax: [phone] Pager: [phone]

## 2023-03-28 DIAGNOSIS — D509 Iron deficiency anemia, unspecified: Secondary | ICD-10-CM | POA: Diagnosis not present

## 2023-03-28 DIAGNOSIS — N2581 Secondary hyperparathyroidism of renal origin: Secondary | ICD-10-CM | POA: Diagnosis not present

## 2023-03-28 DIAGNOSIS — D631 Anemia in chronic kidney disease: Secondary | ICD-10-CM | POA: Diagnosis not present

## 2023-03-28 DIAGNOSIS — Z992 Dependence on renal dialysis: Secondary | ICD-10-CM | POA: Diagnosis not present

## 2023-03-28 DIAGNOSIS — N186 End stage renal disease: Secondary | ICD-10-CM | POA: Diagnosis not present

## 2023-03-28 DIAGNOSIS — T7840XA Allergy, unspecified, initial encounter: Secondary | ICD-10-CM | POA: Diagnosis not present

## 2023-03-28 DIAGNOSIS — E1122 Type 2 diabetes mellitus with diabetic chronic kidney disease: Secondary | ICD-10-CM | POA: Diagnosis not present

## 2023-03-28 DIAGNOSIS — E875 Hyperkalemia: Secondary | ICD-10-CM | POA: Diagnosis not present

## 2023-03-30 ENCOUNTER — Other Ambulatory Visit: Payer: Self-pay

## 2023-03-30 ENCOUNTER — Encounter (HOSPITAL_COMMUNITY): Payer: Self-pay

## 2023-03-30 ENCOUNTER — Observation Stay (HOSPITAL_COMMUNITY)
Admission: EM | Admit: 2023-03-30 | Discharge: 2023-04-01 | Disposition: A | Discharge status: Skilled Nursing Facility | Payer: Medicare HMO | Attending: Family Medicine | Admitting: Family Medicine

## 2023-03-30 ENCOUNTER — Emergency Department (HOSPITAL_COMMUNITY): Payer: Medicare HMO

## 2023-03-30 DIAGNOSIS — R9431 Abnormal electrocardiogram [ECG] [EKG]: Secondary | ICD-10-CM | POA: Diagnosis not present

## 2023-03-30 DIAGNOSIS — R000 Tachycardia, unspecified: Secondary | ICD-10-CM | POA: Diagnosis not present

## 2023-03-30 DIAGNOSIS — R0789 Other chest pain: Secondary | ICD-10-CM

## 2023-03-30 DIAGNOSIS — R079 Chest pain, unspecified: Secondary | ICD-10-CM | POA: Diagnosis present

## 2023-03-30 DIAGNOSIS — Z743 Need for continuous supervision: Secondary | ICD-10-CM | POA: Diagnosis not present

## 2023-03-30 DIAGNOSIS — I700 Atherosclerosis of aorta: Secondary | ICD-10-CM | POA: Diagnosis not present

## 2023-03-30 DIAGNOSIS — J449 Chronic obstructive pulmonary disease, unspecified: Secondary | ICD-10-CM | POA: Diagnosis not present

## 2023-03-30 DIAGNOSIS — E1122 Type 2 diabetes mellitus with diabetic chronic kidney disease: Secondary | ICD-10-CM | POA: Diagnosis not present

## 2023-03-30 DIAGNOSIS — I25110 Atherosclerotic heart disease of native coronary artery with unstable angina pectoris: Secondary | ICD-10-CM | POA: Diagnosis not present

## 2023-03-30 DIAGNOSIS — K219 Gastro-esophageal reflux disease without esophagitis: Secondary | ICD-10-CM | POA: Diagnosis not present

## 2023-03-30 DIAGNOSIS — Z89511 Acquired absence of right leg below knee: Secondary | ICD-10-CM | POA: Diagnosis not present

## 2023-03-30 DIAGNOSIS — Z96653 Presence of artificial knee joint, bilateral: Secondary | ICD-10-CM | POA: Diagnosis not present

## 2023-03-30 DIAGNOSIS — Z720 Tobacco use: Secondary | ICD-10-CM | POA: Diagnosis not present

## 2023-03-30 DIAGNOSIS — I2510 Atherosclerotic heart disease of native coronary artery without angina pectoris: Secondary | ICD-10-CM | POA: Diagnosis present

## 2023-03-30 DIAGNOSIS — I120 Hypertensive chronic kidney disease with stage 5 chronic kidney disease or end stage renal disease: Secondary | ICD-10-CM | POA: Insufficient documentation

## 2023-03-30 DIAGNOSIS — Z91040 Latex allergy status: Secondary | ICD-10-CM | POA: Diagnosis not present

## 2023-03-30 DIAGNOSIS — I25119 Atherosclerotic heart disease of native coronary artery with unspecified angina pectoris: Secondary | ICD-10-CM

## 2023-03-30 DIAGNOSIS — I482 Chronic atrial fibrillation, unspecified: Secondary | ICD-10-CM | POA: Diagnosis present

## 2023-03-30 DIAGNOSIS — I169 Hypertensive crisis, unspecified: Secondary | ICD-10-CM

## 2023-03-30 DIAGNOSIS — E119 Type 2 diabetes mellitus without complications: Secondary | ICD-10-CM

## 2023-03-30 DIAGNOSIS — N186 End stage renal disease: Secondary | ICD-10-CM | POA: Diagnosis not present

## 2023-03-30 DIAGNOSIS — Z992 Dependence on renal dialysis: Secondary | ICD-10-CM | POA: Diagnosis not present

## 2023-03-30 DIAGNOSIS — G4733 Obstructive sleep apnea (adult) (pediatric): Secondary | ICD-10-CM | POA: Diagnosis present

## 2023-03-30 DIAGNOSIS — D649 Anemia, unspecified: Secondary | ICD-10-CM | POA: Diagnosis present

## 2023-03-30 DIAGNOSIS — I200 Unstable angina: Principal | ICD-10-CM | POA: Diagnosis present

## 2023-03-30 DIAGNOSIS — Z79899 Other long term (current) drug therapy: Secondary | ICD-10-CM | POA: Insufficient documentation

## 2023-03-30 DIAGNOSIS — I480 Paroxysmal atrial fibrillation: Secondary | ICD-10-CM

## 2023-03-30 DIAGNOSIS — R0602 Shortness of breath: Secondary | ICD-10-CM | POA: Diagnosis not present

## 2023-03-30 DIAGNOSIS — Z7901 Long term (current) use of anticoagulants: Secondary | ICD-10-CM | POA: Diagnosis not present

## 2023-03-30 DIAGNOSIS — Z794 Long term (current) use of insulin: Secondary | ICD-10-CM | POA: Insufficient documentation

## 2023-03-30 DIAGNOSIS — Z955 Presence of coronary angioplasty implant and graft: Secondary | ICD-10-CM

## 2023-03-30 DIAGNOSIS — I10 Essential (primary) hypertension: Secondary | ICD-10-CM | POA: Diagnosis not present

## 2023-03-30 DIAGNOSIS — R072 Precordial pain: Secondary | ICD-10-CM

## 2023-03-30 DIAGNOSIS — I5030 Unspecified diastolic (congestive) heart failure: Secondary | ICD-10-CM | POA: Diagnosis present

## 2023-03-30 DIAGNOSIS — R7989 Other specified abnormal findings of blood chemistry: Secondary | ICD-10-CM | POA: Diagnosis not present

## 2023-03-30 DIAGNOSIS — I739 Peripheral vascular disease, unspecified: Secondary | ICD-10-CM | POA: Diagnosis present

## 2023-03-30 DIAGNOSIS — I5032 Chronic diastolic (congestive) heart failure: Secondary | ICD-10-CM | POA: Diagnosis present

## 2023-03-30 LAB — HEPATIC FUNCTION PANEL
ALT: 27 U/L (ref 0–44)
AST: 24 U/L (ref 15–41)
Albumin: 3 g/dL — ABNORMAL LOW (ref 3.5–5.0)
Alkaline Phosphatase: 119 U/L (ref 38–126)
Bilirubin, Direct: 0.1 mg/dL (ref 0.0–0.2)
Indirect Bilirubin: 0.3 mg/dL (ref 0.3–0.9)
Total Bilirubin: 0.4 mg/dL (ref 0.3–1.2)
Total Protein: 6 g/dL — ABNORMAL LOW (ref 6.5–8.1)

## 2023-03-30 LAB — CBG MONITORING, ED: Glucose-Capillary: 150 mg/dL — ABNORMAL HIGH (ref 70–99)

## 2023-03-30 LAB — CBC
HCT: 32.5 % — ABNORMAL LOW (ref 39.0–52.0)
Hemoglobin: 10.7 g/dL — ABNORMAL LOW (ref 13.0–17.0)
MCH: 30.4 pg (ref 26.0–34.0)
MCHC: 32.9 g/dL (ref 30.0–36.0)
MCV: 92.3 fL (ref 80.0–100.0)
Platelets: 180 10*3/uL (ref 150–400)
RBC: 3.52 MIL/uL — ABNORMAL LOW (ref 4.22–5.81)
RDW: 13.2 % (ref 11.5–15.5)
WBC: 8.7 10*3/uL (ref 4.0–10.5)
nRBC: 0 % (ref 0.0–0.2)

## 2023-03-30 LAB — IRON AND TIBC
Iron: 81 ug/dL (ref 45–182)
Saturation Ratios: 36 % (ref 17.9–39.5)
TIBC: 224 ug/dL — ABNORMAL LOW (ref 250–450)
UIBC: 143 ug/dL

## 2023-03-30 LAB — RETICULOCYTES
Immature Retic Fract: 9 % (ref 2.3–15.9)
RBC.: 3.52 MIL/uL — ABNORMAL LOW (ref 4.22–5.81)
Retic Count, Absolute: 44.7 10*3/uL (ref 19.0–186.0)
Retic Ct Pct: 1.3 % (ref 0.4–3.1)

## 2023-03-30 LAB — MAGNESIUM: Magnesium: 1.8 mg/dL (ref 1.7–2.4)

## 2023-03-30 LAB — FOLATE: Folate: >40 ng/mL (ref >5.9)

## 2023-03-30 LAB — TROPONIN I (HIGH SENSITIVITY)
Troponin I (High Sensitivity): 24 ng/L — ABNORMAL HIGH (ref <18)
Troponin I (High Sensitivity): 26 ng/L — ABNORMAL HIGH (ref <18)
Troponin I (High Sensitivity): 30 ng/L — ABNORMAL HIGH (ref <18)

## 2023-03-30 LAB — BASIC METABOLIC PANEL
Anion gap: 14 (ref 5–15)
BUN: 38 mg/dL — ABNORMAL HIGH (ref 8–23)
CO2: 24 mmol/L (ref 22–32)
Calcium: 8.4 mg/dL — ABNORMAL LOW (ref 8.9–10.3)
Chloride: 97 mmol/L — ABNORMAL LOW (ref 98–111)
Creatinine, Ser: 5.97 mg/dL — ABNORMAL HIGH (ref 0.61–1.24)
GFR, Estimated: 10 mL/min — ABNORMAL LOW (ref >60)
Glucose, Bld: 171 mg/dL — ABNORMAL HIGH (ref 70–99)
Potassium: 4.3 mmol/L (ref 3.5–5.1)
Sodium: 135 mmol/L (ref 135–145)

## 2023-03-30 LAB — HEPARIN LEVEL (UNFRACTIONATED): Heparin Unfractionated: >1.1 IU/mL — ABNORMAL HIGH (ref 0.30–0.70)

## 2023-03-30 LAB — FERRITIN: Ferritin: 939 ng/mL — ABNORMAL HIGH (ref 24–336)

## 2023-03-30 LAB — TSH: TSH: 1.211 u[IU]/mL (ref 0.350–4.500)

## 2023-03-30 LAB — APTT: aPTT: 33 seconds (ref 24–36)

## 2023-03-30 LAB — HEMOGLOBIN A1C
Hgb A1c MFr Bld: 6.1 % — ABNORMAL HIGH (ref 4.8–5.6)
Mean Plasma Glucose: 128.37 mg/dL

## 2023-03-30 LAB — VITAMIN B12: Vitamin B-12: 432 pg/mL (ref 180–914)

## 2023-03-30 MED ORDER — ASPIRIN 81 MG PO TBEC
81.0000 mg | DELAYED_RELEASE_TABLET | Freq: Every day | ORAL | Status: DC
  Administered 2023-04-01: 81 mg via ORAL
  Filled 2023-03-30: qty 1

## 2023-03-30 MED ORDER — MORPHINE SULFATE (PF) 4 MG/ML IV SOLN
4.0000 mg | Freq: Once | INTRAVENOUS | Status: AC
  Administered 2023-03-30: 4 mg via INTRAVENOUS
  Filled 2023-03-30: qty 1

## 2023-03-30 MED ORDER — LORATADINE 10 MG PO TABS
10.0000 mg | ORAL_TABLET | Freq: Every day | ORAL | Status: DC
  Administered 2023-03-31 – 2023-04-01 (×2): 10 mg via ORAL
  Filled 2023-03-30 (×2): qty 1

## 2023-03-30 MED ORDER — NITROGLYCERIN IN D5W 200-5 MCG/ML-% IV SOLN
0.0000 ug/min | INTRAVENOUS | Status: DC
  Administered 2023-03-30: 5 ug/min via INTRAVENOUS
  Filled 2023-03-30: qty 250

## 2023-03-30 MED ORDER — ACETAMINOPHEN 325 MG PO TABS: 650.0000 mg | ORAL_TABLET | Freq: Four times a day (QID) | ORAL | Status: DC | PRN

## 2023-03-30 MED ORDER — HYDROCODONE-ACETAMINOPHEN 5-325 MG PO TABS: 1.0000 | ORAL_TABLET | ORAL | Status: DC | PRN

## 2023-03-30 MED ORDER — SODIUM CHLORIDE 0.9 % IV SOLN: 250.0000 mL | INTRAVENOUS | Status: DC | PRN

## 2023-03-30 MED ORDER — TAMSULOSIN HCL 0.4 MG PO CAPS
0.4000 mg | ORAL_CAPSULE | Freq: Every day | ORAL | Status: DC
  Administered 2023-03-31 – 2023-04-01 (×2): 0.4 mg via ORAL
  Filled 2023-03-30 (×2): qty 1

## 2023-03-30 MED ORDER — FAMOTIDINE 20 MG PO TABS
20.0000 mg | ORAL_TABLET | ORAL | Status: DC
  Administered 2023-03-31: 20 mg via ORAL
  Filled 2023-03-30: qty 1

## 2023-03-30 MED ORDER — ASPIRIN 81 MG PO TBEC
81.0000 mg | DELAYED_RELEASE_TABLET | Freq: Every day | ORAL | Status: DC
  Administered 2023-03-30 – 2023-03-31 (×2): 81 mg via ORAL
  Filled 2023-03-30 (×2): qty 1

## 2023-03-30 MED ORDER — AMLODIPINE BESYLATE 10 MG PO TABS
10.0000 mg | ORAL_TABLET | Freq: Every day | ORAL | Status: DC
  Administered 2023-03-31 – 2023-04-01 (×2): 10 mg via ORAL
  Filled 2023-03-30 (×2): qty 1

## 2023-03-30 MED ORDER — METOCLOPRAMIDE HCL 5 MG PO TABS: 5.0000 mg | ORAL_TABLET | Freq: Four times a day (QID) | ORAL | Status: DC | PRN

## 2023-03-30 MED ORDER — SODIUM CHLORIDE 0.9% FLUSH
3.0000 mL | Freq: Two times a day (BID) | INTRAVENOUS | Status: DC
  Administered 2023-03-31 – 2023-04-01 (×2): 3 mL via INTRAVENOUS

## 2023-03-30 MED ORDER — ACETAMINOPHEN 650 MG RE SUPP: 650.0000 mg | Freq: Four times a day (QID) | RECTAL | Status: DC | PRN

## 2023-03-30 MED ORDER — OXYCODONE-ACETAMINOPHEN 5-325 MG PO TABS: 1.0000 | ORAL_TABLET | Freq: Four times a day (QID) | ORAL | Status: DC | PRN

## 2023-03-30 MED ORDER — OXYCODONE-ACETAMINOPHEN 5-325 MG PO TABS: 2.0000 | ORAL_TABLET | Freq: Once | ORAL | Status: DC

## 2023-03-30 MED ORDER — INSULIN ASPART 100 UNIT/ML IJ SOLN
0.0000 [IU] | INTRAMUSCULAR | Status: DC
  Administered 2023-03-31 – 2023-04-01 (×2): 1 [IU] via SUBCUTANEOUS

## 2023-03-30 MED ORDER — OXYCODONE-ACETAMINOPHEN 5-325 MG PO TABS
1.0000 | ORAL_TABLET | Freq: Four times a day (QID) | ORAL | Status: DC | PRN
  Administered 2023-03-30 – 2023-03-31 (×2): 1 via ORAL
  Filled 2023-03-30 (×2): qty 1

## 2023-03-30 MED ORDER — NITROGLYCERIN 2 % TD OINT
0.5000 [in_us] | TOPICAL_OINTMENT | Freq: Once | TRANSDERMAL | Status: AC
  Administered 2023-03-30: 0.5 [in_us] via TOPICAL
  Filled 2023-03-30: qty 1

## 2023-03-30 MED ORDER — ISOSORBIDE MONONITRATE ER 60 MG PO TB24
60.0000 mg | ORAL_TABLET | Freq: Every day | ORAL | Status: DC
  Administered 2023-03-31: 60 mg via ORAL
  Filled 2023-03-30: qty 1

## 2023-03-30 MED ORDER — HEPARIN (PORCINE) 25000 UT/250ML-% IV SOLN
1700.0000 [IU]/h | INTRAVENOUS | Status: DC
  Administered 2023-03-30: 1400 [IU]/h via INTRAVENOUS
  Administered 2023-03-31: 1700 [IU]/h via INTRAVENOUS
  Filled 2023-03-30 (×2): qty 250

## 2023-03-30 MED ORDER — INSULIN GLARGINE-YFGN 100 UNIT/ML ~~LOC~~ SOLN
10.0000 [IU] | Freq: Every day | SUBCUTANEOUS | Status: DC
  Administered 2023-03-30 – 2023-03-31 (×2): 10 [IU] via SUBCUTANEOUS
  Filled 2023-03-30 (×4): qty 0.1

## 2023-03-30 MED ORDER — ATORVASTATIN CALCIUM 40 MG PO TABS
40.0000 mg | ORAL_TABLET | Freq: Every day | ORAL | Status: DC
  Administered 2023-03-30 – 2023-03-31 (×2): 40 mg via ORAL
  Filled 2023-03-30 (×2): qty 1

## 2023-03-30 MED ORDER — SODIUM CHLORIDE 0.9% FLUSH: 3.0000 mL | INTRAVENOUS | Status: DC | PRN

## 2023-03-30 MED ORDER — ONDANSETRON 4 MG PO TBDP: 4.0000 mg | ORAL_TABLET | Freq: Once | ORAL | Status: DC

## 2023-03-30 MED ORDER — PANTOPRAZOLE SODIUM 40 MG PO TBEC
40.0000 mg | DELAYED_RELEASE_TABLET | Freq: Every day | ORAL | Status: DC
  Administered 2023-03-31 – 2023-04-01 (×2): 40 mg via ORAL
  Filled 2023-03-30 (×2): qty 1

## 2023-03-30 MED ORDER — FOLIC ACID 1 MG PO TABS
1.0000 mg | ORAL_TABLET | Freq: Every day | ORAL | Status: DC
  Administered 2023-03-31 – 2023-04-01 (×2): 1 mg via ORAL
  Filled 2023-03-30 (×2): qty 1

## 2023-03-30 MED ORDER — AMIODARONE HCL 100 MG PO TABS
100.0000 mg | ORAL_TABLET | Freq: Every day | ORAL | Status: DC
  Administered 2023-03-31 – 2023-04-01 (×2): 100 mg via ORAL
  Filled 2023-03-30 (×2): qty 1

## 2023-03-30 MED ORDER — ROPINIROLE HCL 1 MG PO TABS
2.0000 mg | ORAL_TABLET | Freq: Every day | ORAL | Status: DC
  Administered 2023-03-30 – 2023-03-31 (×2): 2 mg via ORAL
  Filled 2023-03-30 (×2): qty 2

## 2023-03-30 MED ORDER — PREGABALIN 100 MG PO CAPS
100.0000 mg | ORAL_CAPSULE | Freq: Two times a day (BID) | ORAL | Status: DC
  Administered 2023-03-30 – 2023-04-01 (×4): 100 mg via ORAL
  Filled 2023-03-30 (×4): qty 1

## 2023-03-30 NOTE — Assessment & Plan Note (Signed)
You for allergy was notified by ER that patient has been admitted plan for hemodialysis tomorrow

## 2023-03-30 NOTE — Assessment & Plan Note (Signed)
Resume amiodarone 200 mg daily Hold Eliquis while on heparin

## 2023-03-30 NOTE — ED Provider Triage Note (Signed)
Emergency Medicine Provider Triage Evaluation Note  [name] , a 67 y.o. male  was evaluated in triage.  Pt complains of chest pain off and on for several days.  No clear exacerbating or alleviating factors.  Also some mild shortness of breath.  Pain is not pleuritic.  ESRD on Tuesday, Thursday, and Saturday dialysis.  Says that he last went on Thursday and when he went today and told him about the chest pain and the fact that he had taken nitroglycerin they referred him into the emergency department prior to giving him dialysis.  Review of Systems  Positive: Chest pain Negative: Vomiting, diaphoresis  Physical Exam  There were no vitals taken for this visit. Gen:   Awake, no distress   Resp:  Normal effort  Card:   Chest pain not reproducible MSK:   Moves extremities without difficulty, amputation of bilateral lower extremities  Medical Decision Making  Medically screening exam initiated at 1:25 PM.  Appropriate orders placed.  ABDULKARIM [name] was informed that the remainder of the evaluation will be completed by another provider, this initial triage assessment does not replace that evaluation, and the importance of remaining in the ED until their evaluation is complete.   Harold Baton, MD 03/30/23 [phone]

## 2023-03-30 NOTE — Assessment & Plan Note (Signed)
chest pain no EKG changes in the setting of chronic kidney disease likely due to demand ischemia and poor clearance, monitor on telemetry and cycle cardiac enzymes to trend.  ECHO in AM Cardiology is aware On heparin

## 2023-03-30 NOTE — Assessment & Plan Note (Signed)
-   Order Sensitive  SSI   - continue home insulin but decreased to   10units,  -  check TSH and HgA1C  - Hold by mouth medications

## 2023-03-30 NOTE — Assessment & Plan Note (Signed)
Patient denies having sleep apnea not on CPAP

## 2023-03-30 NOTE — ED Notes (Addendum)
ED TO INPATIENT HANDOFF REPORT  ED Nurse Name and Phone #:  Marcello Moores [phone]  S Name/Age/Gender [name] 67 y.o. male Room/Bed: 010C/010C  Code Status   Code Status: Prior  Home/SNF/Other Nursing Home (currently at [facility] for rehab, but normally lives at home) Patient oriented to: self, place, time, and situation Is this baseline? Yes   Triage Complete: Triage complete  Chief Complaint Unstable angina (HCC) [I20.0]  Triage Note Pt bib ems from dialysis; cp and sob x 3 days, taking nitroglycerine with some relief; dialysis not done today after notifying staff of cp ; cp originally 5/10; asa and nitroglycerine given pta; 3/10; placed on 2 L for comfort; clear equal lung sounds; 175/75, P 74, cbg 192; restricted on left; last full dialysis treatment this past Thursday   Allergies Allergies  Allergen Reactions   Contrast Media [Iodinated Contrast Media] Shortness Of Breath and Other (See Comments)    Difficulty breathing and altered mental status     Ivp Dye [Iodinated Contrast Media] Anaphylaxis, Shortness Of Breath and Other (See Comments)    Breathing problems, altered mental state    Adhesive [Tape] Rash and Other (See Comments)    Rash after 1 day of use Do not leave on longer then 3 days with out be changed   Latex Rash and Other (See Comments)    A severe rash appears after the first 24 hours of being placed    Level of Care/Admitting Diagnosis ED Disposition     ED Disposition  Admit   Condition  --   Comment  Hospital Area: [facility] [[id number]]  Level of Care: Progressive [102]  Admit to Progressive based on following criteria: CARDIOVASCULAR & THORACIC of moderate stability with acute coronary syndrome symptoms/low risk myocardial infarction/hypertensive urgency/arrhythmias/heart failure potentially compromising stability and stable post cardiovascular intervention patients.  Admit to Progressive based on following criteria:  NEPHROLOGY stable condition requiring close monitoring for AKI, requiring Hemodialysis or Peritoneal Dialysis either from expected electrolyte imbalance, acidosis, or fluid overload that can be managed by NIPPV or high flow oxygen.  May place patient in observation at [facility] or [facility] if equivalent level of care is available:: No  Covid Evaluation: Asymptomatic - no recent exposure (last 10 days) testing not required  Diagnosis: Unstable angina [facility] [[id number]]  Admitting Physician: Therisa Doyne [[id number]]  Attending Physician: Therisa Doyne [[id number]]          B Medical/Surgery History Past Medical History:  Diagnosis Date   Carotid artery occlusion 11/10/10   LEFT CAROTID ENDARTERECTOMY   Chronic kidney disease    Complication of anesthesia    BP WENT UP AT [facility] "   COPD (chronic obstructive pulmonary disease) (HCC)    pt denies this dx as of 06/01/20 - no inhaler    Diabetes mellitus without complication (HCC)    Diverticulitis    Diverticulosis of colon (without mention of hemorrhage)    DJD (degenerative joint disease)    knees/hands/feet/back/neck   Fatty liver    Full dentures    GERD (gastroesophageal reflux disease)    H/O hiatal hernia    History of blood transfusion    with a past surical procedure per patient 06/01/20   Hyperlipidemia    Hypertension    Neuromuscular disorder (HCC)    peripheral neuropathy   Non-pressure chronic ulcer of other part of left foot limited to breakdown of skin (HCC) 11/12/2016   Osteomyelitis (HCC)    left 5th metatarsal  PAD (peripheral artery disease) (HCC)    Distal aortogram June 2012. Atherectomy left popliteal artery July 2012.    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   Unstable angina (HCC) 09/16/2018   Wears glasses    Past Surgical History:  Procedure Laterality Date   ABDOMINAL AORTOGRAM  W/LOWER EXTREMITY N/A 06/23/2021   Procedure: ABDOMINAL AORTOGRAM W/LOWER EXTREMITY;  Surgeon: Elder Negus, MD;  [facility];  Service: Cardiovascular;  Laterality: N/A;   AMPUTATION  11/05/2011   Procedure: AMPUTATION RAY;  Surgeon: Toni Arthurs, MD;  Location: [facility] OR;  Service: Orthopedics;  Laterality: Right;  Amputation of Right 4&5th Toes   AMPUTATION Left 11/26/2012   Procedure: AMPUTATION RAY;  Surgeon: Toni Arthurs, MD;  Location: [facility] OR;  Service: Orthopedics;  Laterality: Left;  fourth ray amputation   AMPUTATION Right 08/27/2014   Procedure: Transmetatarsal Amputation;  Surgeon: Nadara Mustard, MD;  Location: [facility] OR;  Service: Orthopedics;  Laterality: Right;   AMPUTATION Right 01/14/2015   Procedure: AMPUTATION BELOW KNEE;  Surgeon: Nadara Mustard, MD;  [facility] OR;  Service: Orthopedics;  Laterality: Right;   AMPUTATION Left 10/21/2015   Procedure: Left Foot 5th Ray Amputation;  Surgeon: Nadara Mustard, MD;  Location: [facility] OR;  Service: Orthopedics;  Laterality: Left;   AMPUTATION Left 07/04/2022   Procedure: LEFT BELOW KNEE AMPUTATION;  Surgeon: Nadara Mustard, MD;  Location: [facility] OR;  Service: Orthopedics;  Laterality: Left;   ANTERIOR FUSION CERVICAL SPINE  02/06/06   C4-5, C5-6, C6-7; SURGEON DR. MAX COHEN   AV FISTULA PLACEMENT Left 06/02/2020   Procedure: ARTERIOVENOUS (AV) FISTULA CREATION LEFT;  Surgeon: Maeola Harman, MD;  Location: [facility] OR;  Service: Vascular;  Laterality: Left;   BACK SURGERY     x 3   BASCILIC VEIN TRANSPOSITION Left 07/21/2020   Procedure: LEFT UPPER ARM ATERIOVENOUS SUPERFISTULALIZATION;  Surgeon: Maeola Harman, MD;  Location: [facility] OR;  Service: Vascular;  Laterality: Left;   BELOW KNEE LEG AMPUTATION Right    CARDIAC CATHETERIZATION  10/31/04   2009   CAROTID ENDARTERECTOMY  11/10/10   CAROTID ENDARTERECTOMY Left 11/10/2010   Subtotal occlusion of left internal carotid artery with left hemispheric transient ischemic  attacks.   CAROTID STENT     CARPAL TUNNEL RELEASE Right 10/21/2013   Procedure: RIGHT CARPAL TUNNEL RELEASE;  Surgeon: Nicki Reaper, MD;  Location:  SURGERY CENTER;  Service: Orthopedics;  Laterality: Right;   CHOLECYSTECTOMY     COLON SURGERY     COLONOSCOPY     COLOSTOMY REVERSAL  05/21/2018   ileostomy reversal   CYSTOSCOPY WITH STENT PLACEMENT Bilateral 01/13/2018   Procedure: CYSTOSCOPY WITH BILATERAL URETERAL CATHETER PLACEMENT;  Surgeon: Crist Fat, MD;  Location: [facility] ORS;  Service: Urology;  Laterality: Bilateral;   ESOPHAGEAL MANOMETRY Bilateral 07/19/2014   Procedure: ESOPHAGEAL MANOMETRY (EM);  Surgeon: Beverley Fiedler, MD;  Location: [facility] ENDOSCOPY;  Service: Gastroenterology;  Laterality: Bilateral;   EYE SURGERY Bilateral 2020   cataract   FEMORAL ARTERY STENT     x6   FINGER SURGERY     FOOT SURGERY  04/25/2016    EXCISION BASE 5TH METATARSAL AND PARTIAL CUBOID LEFT FOOT   HERNIA REPAIR     LEFT INGUINAL AND UMBILICAL REPAIRS   HERNIA REPAIR  I & D EXTREMITY Left 04/25/2016   Procedure: EXCISION BASE 5TH METATARSAL AND PARTIAL CUBOID LEFT FOOT;  Surgeon: Nadara Mustard, MD;  [facility];  Service: Orthopedics;  Laterality: Left;   ILEOSTOMY  01/13/2018   Procedure: ILEOSTOMY;  Surgeon: Berna Bue, MD;  Location: [facility]S;  Service: General;;   ILEOSTOMY CLOSURE N/A 05/21/2018   Procedure: ILEOSTOMY REVERSAL ERAS PATHWAY;  Surgeon: Berna Bue, MD;  Location[facility];  Service: General;  Laterality: N/A;   IR RADIOLOGIST EVAL & MGMT  11/19/2017   IR RADIOLOGIST EVAL & MGMT  12/03/2017   IR RADIOLOGIST EVAL & MGMT  12/18/2017   JOINT REPLACEMENT Right 2001   Total knee   LAMINECTOMY     X 3 LUMBAR AND X 2 CERVICAL SPINE OPERATIONS   LAPAROSCOPIC CHOLECYSTECTOMY W/ CHOLANGIOGRAPHY  11/09/04   SURGEON DR. Caleen Essex   LEFT HEART CATH AND CORONARY ANGIOGRAPHY N/A 09/16/2018   Procedure: LEFT HEART CATH AND CORONARY ANGIOGRAPHY;  Surgeon: Elder Negus, MD;  [facility];  Service: Cardiovascular;  Laterality: N/A;   LEFT HEART CATHETERIZATION WITH CORONARY ANGIOGRAM N/A 10/29/2014   Procedure: LEFT HEART CATHETERIZATION WITH CORONARY ANGIOGRAM;  Surgeon: Pamella Pert, MD;  Location: [facility] CATH LAB;  Service: Cardiovascular;  Laterality: N/A;   LIGATION OF COMPETING BRANCHES OF ARTERIOVENOUS FISTULA Left 07/21/2020   Procedure: LIGATION OF COMPETING BRANCHES OF LEFT UPPER ARM ARTERIOVENOUS FISTULA;  Surgeon: Maeola Harman, MD;  Location: [facility] OR;  Service: Vascular;  Laterality: Left;   LOWER EXTREMITY ANGIOGRAM N/A 03/19/2012   Procedure: LOWER EXTREMITY ANGIOGRAM;  Surgeon: Kathleene Hazel, MD;  Location: [facility] CATH LAB;  Service: Cardiovascular;  Laterality: N/A;   LOWER EXTREMITY ANGIOGRAPHY N/A 06/20/2021   Procedure: LOWER EXTREMITY ANGIOGRAPHY;  Surgeon: Elder Negus, MD;  [facility];  Service: Cardiovascular;  Laterality: N/A;   NECK SURGERY     PARTIAL COLECTOMY N/A 01/13/2018   Procedure: LAPAROSCOPIC ASSISTED   SIGMOID COLECTOMY ILEOSTOMY;  Surgeon: Berna Bue, MD;  Location: [facility];  Service: General;  Laterality: N/A;   PENILE PROSTHESIS IMPLANT  08/14/05   INFRAPUBIC INSERTION OF INFLATABLE PENILE PROSTHESIS; SURGEON DR. Logan Bores   PENILE PROSTHESIS IMPLANT     PERCUTANEOUS CORONARY STENT INTERVENTION (PCI-S) Right 10/29/2014   Procedure: PERCUTANEOUS CORONARY STENT INTERVENTION (PCI-S);  Surgeon: Pamella Pert, MD;  Location: [facility] CATH LAB;  Service: Cardiovascular;  Laterality: Right;   PERIPHERAL VASCULAR INTERVENTION Left 06/23/2021   Procedure: PERIPHERAL VASCULAR INTERVENTION;  Surgeon: Elder Negus, MD;  [facility];  Service: Cardiovascular;  Laterality: Left;   REMOVAL OF PENILE PROSTHESIS N/A 06/14/2021   Procedure: Removal of THREE piece inflatable penile prosthesis;  Surgeon: Crista Elliot, MD;  Location: [facility] OR;  Service:  Urology;  Laterality: N/A;   SHOULDER ARTHROSCOPY     SPINE SURGERY     TOE AMPUTATION Left    TONSILLECTOMY     TOTAL KNEE ARTHROPLASTY  07/2002   RIGHT KNEE ; SURGEON  DR. [name] ALSO HAD ARTHROSCOPIC RIGHT KNEE IN  10/2001   TOTAL KNEE ARTHROPLASTY     ULNAR NERVE TRANSPOSITION Right 10/21/2013   Procedure: RIGHT ELBOW  ULNAR NERVE DECOMPRESSION;  Surgeon: Nicki Reaper, MD;  Location: [facility];  Service: Orthopedics;  Laterality: Right;     A IV Location/Drains/Wounds Patient Lines/Drains/Airways Status     Active Line/Drains/Airways  Name Placement date Placement time Site Days   Peripheral IV 03/30/23 20 G Right Antecubital 03/30/23  1600  Antecubital  less than 1   Fistula / Graft Left Upper arm Arteriovenous fistula 07/21/20  0825  Upper arm  982   Negative Pressure Wound Therapy Pretibial Left 07/04/22  0831  --  269   Pressure Injury 01/11/22 Coccyx Right;Left Stage 2 -  Partial thickness loss of dermis presenting as a shallow open injury with a red, pink wound bed without slough. 01/11/22  2159  -- 443   Pressure Injury 06/24/22 Thigh Left;Posterior Deep Tissue Pressure Injury - Purple or maroon localized area of discolored intact skin or blood-filled blister due to damage of underlying soft tissue from pressure and/or shear. 06/24/22  2043  -- 279   Wound / Incision (Open or Dehisced) 07/15/22 Skin tear Buttocks Left 07/15/22  2300  Buttocks  258            Intake/Output Last 24 hours  Intake/Output Summary (Last 24 hours) at 03/30/2023 2033 Last data filed at 03/30/2023 2033 Gross per 24 hour  Intake 1.52 ml  Output --  Net 1.52 ml    Labs/Imaging Results for orders placed or performed during the hospital encounter of 03/30/23 (from the past 48 hour(s))  Basic metabolic panel     Status: Abnormal   Collection Time: 03/30/23  1:44 PM  Result Value Ref Range   Sodium 135 135 - 145 mmol/L   Potassium 4.3 3.5 - 5.1 mmol/L   Chloride 97 (L) 98  - 111 mmol/L   CO2 24 22 - 32 mmol/L   Glucose, Bld 171 (H) 70 - 99 mg/dL    Comment: Glucose reference range applies only to samples taken after fasting for at least 8 hours.   BUN 38 (H) 8 - 23 mg/dL   Creatinine, Ser 6.96 (H) 0.61 - 1.24 mg/dL   Calcium 8.4 (L) 8.9 - 10.3 mg/dL   GFR, Estimated 10 (L) >60 mL/min    Comment: (NOTE) Calculated using the CKD-EPI Creatinine Equation (2021)    Anion gap 14 5 - 15    Comment: Performed at [facility], [address] [place], [place] [address]  CBC     Status: Abnormal   Collection Time: 03/30/23  1:44 PM  Result Value Ref Range   WBC 8.7 4.0 - 10.5 K/uL   RBC 3.52 (L) 4.22 - 5.81 MIL/uL   Hemoglobin 10.7 (L) 13.0 - 17.0 g/dL   HCT 41.3 (L) 24.4 - 01.0 %   MCV 92.3 80.0 - 100.0 fL   MCH 30.4 26.0 - 34.0 pg   MCHC 32.9 30.0 - 36.0 g/dL   RDW 27.2 53.6 - 64.4 %   Platelets 180 150 - 400 K/uL   nRBC 0.0 0.0 - 0.2 %    Comment: Performed at [facility], [address] [place], [place] [address]  Troponin I (High Sensitivity)     Status: Abnormal   Collection Time: 03/30/23  1:44 PM  Result Value Ref Range   Troponin I (High Sensitivity) 24 (H) <18 ng/L    Comment: (NOTE) Elevated high sensitivity troponin I (hsTnI) values and significant  changes across serial measurements may suggest ACS but many other  chronic and acute conditions are known to elevate hsTnI results.  Refer to the "Links" section for chest pain algorithms and additional  guidance. Performed at [facility], [address], [place], [place] [address]  Troponin I (High Sensitivity)     Status: Abnormal   Collection Time: 03/30/23  4:52 PM  Result Value Ref Range   Troponin I (High Sensitivity) 26 (H) <18 ng/L    Comment: (NOTE) Elevated high sensitivity troponin I (hsTnI) values and significant  changes across serial measurements may suggest ACS but many other  chronic and acute conditions are known to elevate hsTnI results.  Refer to the  "Links" section for chest pain algorithms and additional  guidance. Performed at [facility], [address] [place], [place] [address]   Heparin level (unfractionated)     Status: Abnormal   Collection Time: 03/30/23  7:01 PM  Result Value Ref Range   Heparin Unfractionated >1.10 (H) 0.30 - 0.70 IU/mL    Comment: (NOTE) The clinical reportable range upper limit is being lowered to >1.10 to align with the FDA approved guidance for the current laboratory assay.  If heparin results are below expected values, and patient dosage has  been confirmed, suggest follow up testing of antithrombin III levels. Performed at [facility], [address] [place], [place] [address]   APTT     Status: None   Collection Time: 03/30/23  7:01 PM  Result Value Ref Range   aPTT 33 24 - 36 seconds    Comment: Performed at [facility], [address] [place], [place] [address]  Magnesium     Status: None   Collection Time: 03/30/23  7:01 PM  Result Value Ref Range   Magnesium 1.8 1.7 - 2.4 mg/dL    Comment: Performed at [facility], [address] [place], [place] [address]  Reticulocytes     Status: Abnormal   Collection Time: 03/30/23  7:22 PM  Result Value Ref Range   Retic Ct Pct 1.3 0.4 - 3.1 %   RBC. 3.52 (L) 4.22 - 5.81 MIL/uL   Retic Count, Absolute 44.7 19.0 - 186.0 K/uL   Immature Retic Fract 9.0 2.3 - 15.9 %    Comment: Performed at [facility], [address], [place], [place] [address]  Hemoglobin A1c     Status: Abnormal   Collection Time: 03/30/23  7:22 PM  Result Value Ref Range   Hgb A1c MFr Bld 6.1 (H) 4.8 - 5.6 %    Comment: (NOTE) Pre diabetes:          5.7%-6.4%  Diabetes:              >6.4%  Glycemic control for   <7.0% adults with diabetes    Mean Plasma Glucose 128.37 mg/dL    Comment: Performed at [facility], [address], [place], [place] [address]  CBG monitoring, ED     Status: Abnormal   Collection Time: 03/30/23   8:09 PM  Result Value Ref Range   Glucose-Capillary 150 (H) 70 - 99 mg/dL    Comment: Glucose reference range applies only to samples taken after fasting for at least 8 hours.   *Note: Due to a large number of results and/or encounters for the requested time period, some results have not been displayed. A complete set of results can be found in Results Review.   DG Chest 2 View  Result Date: 03/30/2023 CLINICAL DATA:  Left-sided chest pain with shortness of breath for 3 days. Dialysis patient. EXAM: CHEST - 2 VIEW COMPARISON:  Radiographs 11/19/2022 and 06/23/2022. Chest CT 06/23/2022. FINDINGS: The heart size and mediastinal contours are  stable. There is aortic atherosclerosis. Interval improved aeration of both lungs without evidence of focal airspace disease, edema, pleural effusion or pneumothorax. Probable mild chronic central airway thickening. Previous lower cervical fusion noted. No acute osseous findings are seen. IMPRESSION: No evidence of acute cardiopulmonary process. Interval improved aeration of both lungs. Electronically Signed   By: Carey Bullocks M.D.   On: 03/30/2023 14:16    Pending Labs Unresulted Labs (From admission, onward)     Start     Ordered   04/01/23 0500  Heparin level (unfractionated)  Daily,   R      03/30/23 1902   04/01/23 0500  APTT  Daily,   R      03/30/23 1902   03/31/23 0500  Prealbumin  Tomorrow morning,   R        03/30/23 1921   03/31/23 0300  Heparin level (unfractionated)  Once-Timed,   URGENT        03/30/23 1902   03/31/23 0300  APTT  Once-Timed,   STAT        03/30/23 1902   03/30/23 1922  TSH  Add-on,   AD        03/30/23 1921   03/30/23 1922  Hepatic function panel  Add-on,   AD       Question:  Release to patient  Answer:  Immediate   03/30/23 1921   03/30/23 1922  Vitamin B12  (Anemia Panel (PNL))  Once,   URGENT        03/30/23 1921   03/30/23 1922  Folate  (Anemia Panel (PNL))  Once,   URGENT        03/30/23 1921   03/30/23 1922   Iron and TIBC  (Anemia Panel (PNL))  Once,   URGENT        03/30/23 1921   03/30/23 1922  Ferritin  (Anemia Panel (PNL))  Once,   URGENT        03/30/23 1921            Vitals/Pain Today's Vitals   03/30/23 1725 03/30/23 1728 03/30/23 1815 03/30/23 2032  BP: (!) 190/80     Pulse:      Resp: 17     Temp:   97.9 F (36.6 C)   TempSrc:   Oral   SpO2:  100%    PainSc:    2     Isolation Precautions No active isolations  Medications Medications  nitroGLYCERIN 50 mg in dextrose 5 % 250 mL (0.2 mg/mL) infusion (10 mcg/min Intravenous Infusion Verify 03/30/23 2033)  heparin ADULT infusion 100 units/mL (25000 units/245mL) (has no administration in time range)  insulin aspart (novoLOG) injection 0-6 Units ( Subcutaneous Not Given 03/30/23 2010)  nitroGLYCERIN (NITROGLYN) 2 % ointment 0.5 inch (0.5 inches Topical Given 03/30/23 1718)  morphine (PF) 4 MG/ML injection 4 mg (4 mg Intravenous Given 03/30/23 1717)    Mobility manual wheelchair     Focused Assessments Cardiac Assessment Handoff:    Lab Results  Component Value Date   CKTOTAL 101 06/27/2012   CKMB 2.2 11/02/2011   TROPONINI 0.03 (HH) 09/17/2018   Lab Results  Component Value Date   DDIMER <0.27 09/07/2021   Does the Patient currently have chest pain? Yes    R Recommendations: See Admitting Provider Note  Report given to:   Additional Notes:  Dialysis pt (left arm restricted). Bilteral BKA. Currently waiting on IV team to put in another line in order to start heparin. Dr.  Doutova aware.

## 2023-03-30 NOTE — Assessment & Plan Note (Signed)
Currently stable euvolemic.  Fluid management as per nephrology

## 2023-03-30 NOTE — Progress Notes (Signed)
ANTICOAGULATION CONSULT NOTE - Initial Consult  Pharmacy Consult for Heparin Indication: chest pain/ACS  Allergies  Allergen Reactions   Contrast Media [Iodinated Contrast Media] Shortness Of Breath and Other (See Comments)    Difficulty breathing and altered mental status     Ivp Dye [Iodinated Contrast Media] Anaphylaxis, Shortness Of Breath and Other (See Comments)    Breathing problems, altered mental state    Adhesive [Tape] Rash and Other (See Comments)    Rash after 1 day of use Do not leave on longer then 3 days with out be changed   Latex Rash and Other (See Comments)    A severe rash appears after the first 24 hours of being placed    Patient Measurements:   Heparin Dosing Weight: 105.9 kg  Vital Signs: Temp: 97.9 F (36.6 C) (07/20 1815) Temp Source: Oral (07/20 1815) BP: 190/80 (07/20 1725) Pulse Rate: 72 (07/20 1326)  Labs: Recent Labs    03/30/23 1344 03/30/23 1652  HGB 10.7*  --   HCT 32.5*  --   PLT 180  --   CREATININE 5.97*  --   TROPONINIHS 24* 26*    Estimated Creatinine Clearance: 15.9 mL/min (A) (by C-G formula based on SCr of 5.97 mg/dL (H)).   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   LEFT CAROTID ENDARTERECTOMY   Chronic kidney disease    Complication of anesthesia    BP WENT UP AT [facility] "   COPD (chronic obstructive pulmonary disease) (HCC)    pt denies this dx as of 06/01/20 - no inhaler    Diabetes mellitus without complication (HCC)    Diverticulitis    Diverticulosis of colon (without mention of hemorrhage)    DJD (degenerative joint disease)    knees/hands/feet/back/neck   Fatty liver    Full dentures    GERD (gastroesophageal reflux disease)    H/O hiatal hernia    History of blood transfusion    with a past surical procedure per patient 06/01/20   Hyperlipidemia    Hypertension    Neuromuscular disorder (HCC)    peripheral neuropathy   Non-pressure chronic ulcer of  other part of left foot limited to breakdown of skin (HCC) 11/12/2016   Osteomyelitis (HCC)    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 Atherectomy left popliteal artery July 2012.    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 Patient is presenting with chest pain. Heparin per pharmacy consult placed for chest pain/ACS.  Patient is on apixaban prior to arrival. Last dose 7/20am. Patient is stating that he takes this once daily he thinks, of note. Will require aPTT monitoring due to likely falsely high anti-Xa level secondary to DOAC use.  Hgb 10.7; plt 180  Goal of Therapy:  Heparin level 0.3-0.7 units/ml aPTT 66-102 seconds Monitor platelets by anticoagulation protocol: Yes   Plan:  No initial heparin bolus Start heparin infusion at  1400 units/hr Check aPTT & anti-Xa level in 8 hours and daily while on heparin Continue to monitor via aPTT until levels are correlated Continue to monitor H&H and platelets  Delmar Landau, PharmD, BCPS 03/30/2023 6:48 PM ED Clinical Pharmacist -  [phone]

## 2023-03-30 NOTE — Assessment & Plan Note (Signed)
>>  ASSESSMENT AND PLAN FOR PAD (PERIPHERAL ARTERY DISEASE) (HCC) WRITTEN ON 03/30/2023  9:27 PM BY DOUTOVA, ANASTASSIA, MD  Now status post bilateral BKA

## 2023-03-30 NOTE — Assessment & Plan Note (Signed)
Chronic-stable.

## 2023-03-30 NOTE — Assessment & Plan Note (Signed)
-   Spoke about importance of quitting spent 5 minutes discussing options for treatment, prior attempts at quitting, and dangers of smoking  -At this point patient is    interested in quitting  - refused nicotine patch   - nursing tobacco cessation protocol   

## 2023-03-30 NOTE — Assessment & Plan Note (Signed)
Chronic stable continue Protonix

## 2023-03-30 NOTE — Assessment & Plan Note (Signed)
Chest pain seems to be reproducible palpation.  Pain management as needed.  Given risk factors appreciate cardiology consult and recommendations

## 2023-03-30 NOTE — Subjective & Objective (Signed)
Presented w CP , went to HD today but given CP was sent to ER CP has been persistent CP improved with nitroglycerin but still on going Pt has hx of CAD Troponin has been flat

## 2023-03-30 NOTE — Assessment & Plan Note (Signed)
Now status post bilateral BKA

## 2023-03-30 NOTE — Assessment & Plan Note (Signed)
Chest pain with some elements of musculoskeletal but cannot rule out unstable angina.  Will continue to cycle cardiac enzymes so far appears to be stable.  Appreciate cardiology consult. Continue heparin and nitro as per cardiology. Daily aspirin 81 mg daily

## 2023-03-30 NOTE — ED Provider Notes (Cosign Needed Addendum)
[place] EMERGENCY DEPARTMENT AT [facility] Provider Note   CSN: [id number] Arrival date & time: 03/30/23  1313     History  Chief Complaint  Patient presents with   Chest Pain    [name] is a 67 y.o. male with past medical history of CAD, ESRD on HD Tuesday/Thursday/Saturday, COPD, and DM, chronic pain, GERD, HTN, HLD, neuromuscular disorder, PAD who presents to the ED complaining of chest pain.  He complains of intermittent chest pain for the last 3 days.  States that currently it is a 5/10, sepsis, nonradiating, feels like a pressure on his chest.  Associated with a "tightness" in his chest that makes him feel short of breath.  No known exacerbating factors.  No cough or congestion.  Pain is not pleuritic.  Has been adherent with dialysis schedule but was sent to the ED for evaluation today without completing treatment due to his chest pain.  He has been taking nitroglycerin at home with temporary relief of pain.  He denies nausea, vomiting, diaphoresis, radiating pain, abdominal pain, or other acute complaints today.  Nitroglycerin x 2 and aspirin given before arrival today.  He was initially placed on 2 L nasal cannula O2 for comfort by EMS but on arrival does have a normal oxygen saturation on room air.  Per chart review, last coronary angiogram appears to have been in January 2021 and patient had widely patent stents.  Last echo in October 2023 with EF of 55 to 60%.      Home Medications Prior to Admission medications   Medication Sig Start Date End Date Taking? Authorizing Provider  amiodarone (PACERONE) 200 MG tablet Take 1 tablet (200 mg total) by mouth daily. Patient taking differently: Take 100 mg by mouth daily. 10/29/22   Yates Decamp, MD  amLODipine (NORVASC) 10 MG tablet Take 1 tablet (10 mg total) by mouth daily. 01/21/23   Yates Decamp, MD  apixaban (ELIQUIS) 5 MG TABS tablet Take 1 tablet (5 mg total) by mouth 2 (two) times daily. 01/14/22 03/27/23  Almon Hercules, MD  atorvastatin (LIPITOR) 40 MG tablet Take 40 mg by mouth at bedtime.     [provider]  B Complex-C-Zn-Folic Acid (DIALYVITE/ZINC) TABS Take 1 tablet by mouth Every Tuesday,Thursday,and Saturday with dialysis. 10/04/21   [provider]  famotidine (PEPCID) 20 MG tablet Take 20 mg by mouth every other day.    [provider]  fentaNYL (DURAGESIC) 50 MCG/HR Place 1 patch onto the skin every 3 (three) days. 11/23/22   Dorcas Carrow, MD  folic acid (FOLVITE) 1 MG tablet Take 1 mg by mouth daily. 09/18/21   [provider]  Glucosamine HCl (GLUCOSAMINE PO) Take 500 mg by mouth 2 (two) times daily.    [provider]  insulin aspart (NOVOLOG FLEXPEN) 100 UNIT/ML FlexPen Inject 0-12 Units into the skin See admin instructions. Inject as per sliding scale: If 0 - 69 = 0 initiate hypoglycemia orders;  70 - 150 = 0;  151 - 200 = 2;  201 - 250 = 4;  251 - 300 = 6;  301 - 350 = 8;  351 - 400 = 10;  401 - 450 = 12.  If >400, give 12 units, recheck in 1 hour. If still >400, call MD/NP for further orders. Subcutaneously with meals in addition to scheduled 4 unites with meals.    [provider]  insulin glargine (LANTUS) 100 UNIT/ML injection Inject 0.2 mLs (20 Units  total) into the skin at bedtime. 06/28/22   Zigmund Daniel., MD  isosorbide mononitrate (IMDUR) 60 MG 24 hr tablet Take 1 tablet (60 mg total) by mouth daily. 01/21/23 04/21/23  Yates Decamp, MD  lidocaine-prilocaine (EMLA) cream Apply 1 application topically See admin instructions. Prior to Dialysis days Tuesday,Thursday and saturday 06/06/21   [provider]  loratadine (CLARITIN) 10 MG tablet Take 10 mg by mouth daily.    [provider]  metoCLOPramide (REGLAN) 5 MG tablet Take 1 tablet (5 mg total) by mouth 4 (four) times daily -  before meals and at bedtime. Patient taking differently: Take 5 mg by mouth in the morning, at noon, in the evening, and at bedtime.  01/14/22 03/27/23  Almon Hercules, MD  NEEDLE, REUSABLE, 22 G 22G X 1-1/2" MISC 1 Units by Does not apply route as directed. For B12 IM inj 02/15/18   Elgergawy, Leana Roe, MD  nitroGLYCERIN (NITROSTAT) 0.4 MG SL tablet Place 0.4 mg under the tongue every 5 (five) minutes as needed for chest pain. 01/06/22   [provider]  omega-3 acid ethyl esters (LOVAZA) 1 g capsule Take 2 g by mouth 2 (two) times daily.    [provider]  pantoprazole (PROTONIX) 40 MG tablet Take 1 tablet (40 mg total) by mouth 2 (two) times daily for 30 days, THEN 1 tablet (40 mg total) daily. Patient taking differently:  1 tablet (40 mg total) twice daily. 01/14/22 03/27/23  Almon Hercules, MD  Polyvinyl Alcohol-Povidone PF 1.4-0.6 % SOLN Place 1 drop into both eyes 2 (two) times daily as needed (dry eyes).     [provider]  pregabalin (LYRICA) 100 MG capsule Take 1 capsule (100 mg total) by mouth 2 (two) times daily for 5 days. 11/23/22 03/27/23  Dorcas Carrow, MD  rOPINIRole (REQUIP) 2 MG tablet Take 2 mg by mouth at bedtime.    [provider]  senna-docusate (SENOKOT-S) 8.6-50 MG tablet Take 2 tablets by mouth at bedtime.    [provider]  Syringe/Needle, Disp, (SYRINGE 3CC/22GX1-1/2") 22G X 1-1/2" 3 ML MISC 1 Syringe by Does not apply route as directed. For b12 IM inj 02/15/18   Elgergawy, Leana Roe, MD  tamsulosin (FLOMAX) 0.4 MG CAPS capsule Take 0.4 mg by mouth daily.    [provider]      Allergies    Contrast media [iodinated contrast media], Ivp dye [iodinated contrast media], Adhesive [tape], and Latex    Review of Systems   Review of Systems  All other systems reviewed and are negative.   Physical Exam Updated Vital Signs BP (!) 190/80   Pulse 72   Temp 97.9 F (36.6 C) (Oral)   Resp 17   SpO2 100%  Physical Exam Vitals and nursing note reviewed.  Constitutional:      General: He is not in acute distress.    Appearance: Normal appearance.  HENT:      Head: Normocephalic and atraumatic.     Mouth/Throat:     Mouth: Mucous membranes are moist.  Eyes:     Conjunctiva/sclera: Conjunctivae normal.  Cardiovascular:     Rate and Rhythm: Normal rate and regular rhythm.     Heart sounds: No murmur heard. Pulmonary:     Effort: Pulmonary effort is normal.     Breath sounds: Normal breath sounds.  Abdominal:     General: Abdomen is flat.     Palpations: Abdomen is soft.  Tenderness: There is no abdominal tenderness.  Musculoskeletal:     Cervical back: Neck supple.  Skin:    General: Skin is warm and dry.     Capillary Refill: Capillary refill takes less than 2 seconds.  Neurological:     Mental Status: He is alert. Mental status is at baseline.  Psychiatric:        Behavior: Behavior normal.     ED Results / Procedures / Treatments   Labs (all labs ordered are listed, but only abnormal results are displayed) Labs Reviewed  BASIC METABOLIC PANEL - Abnormal; Notable for the following components:      Result Value   Chloride 97 (*)    Glucose, Bld 171 (*)    BUN 38 (*)    Creatinine, Ser 5.97 (*)    Calcium 8.4 (*)    GFR, Estimated 10 (*)    All other components within normal limits  CBC - Abnormal; Notable for the following components:   RBC 3.52 (*)    Hemoglobin 10.7 (*)    HCT 32.5 (*)    All other components within normal limits  TROPONIN I (HIGH SENSITIVITY) - Abnormal; Notable for the following components:   Troponin I (High Sensitivity) 24 (*)    All other components within normal limits  TROPONIN I (HIGH SENSITIVITY) - Abnormal; Notable for the following components:   Troponin I (High Sensitivity) 26 (*)    All other components within normal limits  HEPARIN LEVEL (UNFRACTIONATED)  HEPARIN LEVEL (UNFRACTIONATED)  APTT  APTT    EKG   Radiology DG Chest 2 View  Result Date: 03/30/2023 CLINICAL DATA:  Left-sided chest pain with shortness of breath for 3 days. Dialysis patient. EXAM: CHEST - 2 VIEW  COMPARISON:  Radiographs 11/19/2022 and 06/23/2022. Chest CT 06/23/2022. FINDINGS: The heart size and mediastinal contours are stable. There is aortic atherosclerosis. Interval improved aeration of both lungs without evidence of focal airspace disease, edema, pleural effusion or pneumothorax. Probable mild chronic central airway thickening. Previous lower cervical fusion noted. No acute osseous findings are seen. IMPRESSION: No evidence of acute cardiopulmonary process. Interval improved aeration of both lungs. Electronically Signed   By: Carey Bullocks M.D.   On: 03/30/2023 14:16    Procedures Procedures    Medications Ordered in ED Medications  nitroGLYCERIN 50 mg in dextrose 5 % 250 mL (0.2 mg/mL) infusion (has no administration in time range)  heparin ADULT infusion 100 units/mL (25000 units/269mL) (has no administration in time range)  nitroGLYCERIN (NITROGLYN) 2 % ointment 0.5 inch (0.5 inches Topical Given 03/30/23 1718)  morphine (PF) 4 MG/ML injection 4 mg (4 mg Intravenous Given 03/30/23 1717)    ED Course/ Medical Decision Making/ A&P                             Medical Decision Making Amount and/or Complexity of Data Reviewed Labs: ordered. Decision-making details documented in ED Course. Radiology: ordered. Decision-making details documented in ED Course. ECG/medicine tests: ordered. Decision-making details documented in ED Course.  Risk Prescription drug management. Decision regarding hospitalization.   Medical Decision Making:   [name] is a 67 y.o. male who presented to the ED today with chest pain detailed above.    Patient's presentation is complicated by their history of CAD, CKD, COPD, DM, hypertension, hyperlipidemia, neuromuscular disorder, PAD.  Complete initial physical exam performed, notably the patient  was in no acute distress.  Lungs clear  to auscultation.  No respiratory distress.  Abdomen soft and nontender.  Neurologically intact. Reviewed and  confirmed nursing documentation for past medical history, family history, social history.    Initial Assessment:   With the patient's presentation of chest pain, the emergent differential diagnosis of chest pain includes: Acute coronary syndrome, pericarditis, aortic dissection, pulmonary embolism, tension pneumothorax, and esophageal rupture. Other urgent/non-acute considerations include, but are not limited to: chronic angina, aortic stenosis, cardiomyopathy, myocarditis, mitral valve prolapse, pulmonary hypertension, hypertrophic obstructive cardiomyopathy (HOCM), aortic insufficiency, right ventricular hypertrophy, pneumonia, pleuritis, bronchitis, pneumothorax, tumor, gastroesophageal reflux disease (GERD), esophageal spasm, Mallory-Weiss syndrome, peptic ulcer disease, biliary disease, pancreatitis, functional gastrointestinal pain, cervical or thoracic disk disease or arthritis, shoulder arthritis, costochondritis, subacromial bursitis, anxiety or panic attack, herpes zoster, breast disorders, chest wall tumors, thoracic outlet syndrome, mediastinitis.    Initial Plan:  Screening labs including CBC and Metabolic panel to evaluate for infectious or metabolic etiology of disease.  CXR to evaluate for structural/infectious intrathoracic pathology.  EKG and troponin to evaluate for cardiac pathology Objective evaluation as reviewed   Initial Study Results:   Laboratory  All laboratory results reviewed without evidence of clinically relevant pathology.   Exceptions include: Creatinine 5.97 similar to baseline, troponin 24 with delta at 26, hemoglobin 10.7  EKG EKG was reviewed independently. ST segments without concerns for elevations.   EKG: normal EKG, normal sinus rhythm, unchanged from previous tracings, normal sinus rhythm. No STEMI.   Radiology:  All images reviewed independently. Agree with radiology report at this time.   DG Chest 2 View  Result Date: 03/30/2023 CLINICAL DATA:   Left-sided chest pain with shortness of breath for 3 days. Dialysis patient. EXAM: CHEST - 2 VIEW COMPARISON:  Radiographs 11/19/2022 and 06/23/2022. Chest CT 06/23/2022. FINDINGS: The heart size and mediastinal contours are stable. There is aortic atherosclerosis. Interval improved aeration of both lungs without evidence of focal airspace disease, edema, pleural effusion or pneumothorax. Probable mild chronic central airway thickening. Previous lower cervical fusion noted. No acute osseous findings are seen. IMPRESSION: No evidence of acute cardiopulmonary process. Interval improved aeration of both lungs. Electronically Signed   By: Carey Bullocks M.D.   On: 03/30/2023 14:16      Consults: Case discussed with hospitalist, Dr. Adela Glimpse, who recommended consulting nephrology and cardiology. Re-consulted post discussion with cardiology and nephrology and will admit.  Case discussed with nephrology, Dr. Marisue Humble, who will put patient on inpatient dialysis schedule and plan to dialyze tomorrow.  Case discussed with Dr. Odis Hollingshead with cardiology who recommend placing on heparin and nitroglycerin, admitting to medicine, and he will see tomorrow.  Final Assessment and Plan:   67 year old male presents to ED c/o intermittent chest pain. No acute changes on EKG. Troponins similar to baseline with history of renal failure. Delta without significant changes. CXR negative. No evidence of volume overload. Pt remains with persistent chest pain despite NTG, home pain medication, morphine. Seen by cardiology in office earlier this week with good report. LCTA, do not suspect COPD exacerbation, no complaints of dyspnea. He is fairly hypertensive. Reports compliance with home medication regimen. Dialyzes T/R/Sat - no missed sessions apart from today when he was sent to ED secondary to chest pain. Will plan to admit for unstable angina. Pt agreeable with plan. Cardiology aware and will follow. Started on NTG and Heparin. Admitted  to hospital medicine.     Clinical Impression:  1. Unstable angina (HCC)   2. Chest pain, unspecified type  Admit          Final Clinical Impression(s) / ED Diagnoses Final diagnoses:  Unstable angina [facility])  Chest pain, unspecified type    Rx / DC Orders ED Discharge Orders     None         Tonette Lederer, PA-C 03/30/23 1904    Richardson Dopp 03/30/23 1953    Rondel Baton, MD 03/31/23 [phone]

## 2023-03-30 NOTE — Assessment & Plan Note (Signed)
Chronic stable obtain anemia panel 

## 2023-03-30 NOTE — ED Triage Notes (Signed)
Pt bib ems from dialysis; cp and sob x 3 days, taking nitroglycerine with some relief; dialysis not done today after notifying staff of cp ; cp originally 5/10; asa and nitroglycerine given pta; 3/10; placed on 2 L for comfort; clear equal lung sounds; 175/75, P 74, cbg 192; restricted on left; last full dialysis treatment this past Thursday

## 2023-03-30 NOTE — Assessment & Plan Note (Signed)
Resume home medications. Plan to hemodialyzed tomorrow as patient skipped hemodialysis today.  Secondary to chest pain. This should help her blood pressure as well Patient currently on nitro drip for chest pain but that should also help her blood pressure management

## 2023-03-30 NOTE — Assessment & Plan Note (Signed)
Cardiology is aware the patient is being admitted Given chest pain would like to start on heparin drip as well as nitro drip Make sure he is on aspirin daily Check lipid panel and LPA

## 2023-03-30 NOTE — H&P (Signed)
[name] QMV:[id number] DOB: 1955-12-24 DOA: 03/30/2023     PCP: Lonie Peak, PA-C   Outpatient Specialists:  CARDS:  Dr.  Jacinto Halim  NEphrology:   Dr. Signe Colt   Orthopedics Dr. Lajoyce Corners  Patient arrived to ER on 03/30/23 at 1313 Referred by Attending Rondel Baton, MD   Patient coming from:   [facility]  Chief Complaint:   Chief Complaint  Patient presents with   Chest Pain    HPI: [name] is a 67 y.o. male with medical history significant of ESRD, DM2, CAP, PAD sp bilateral BKA multiple peripheral interventions in the past,   severe diabetic retinopathy,, right carotid endarterectomy in 2012 , OSA Unable to tolerate CPAP, chronic back pain and neck and back surgery in past and on chronic pain medications, paroxysmal atrial fibrillation on Eliquis.     Presented with  Chest pain started with AM was a bit better with nitroglycerin not much wore with deep breath worse when moves CP worse with palpation Presented w CP , went to HD today but given CP was sent to ER CP has been persistent CP improved with nitroglycerin but still on going Pt has hx of CAD Troponin has been flat     Does  smoke half a pack a day  but interested in quitting  Did not respond well to the patch  Lab Results  Component Value Date   SARSCOV2NAA NEGATIVE 06/23/2022   SARSCOV2NAA POSITIVE (A) 09/02/2021   SARSCOV2NAA NEGATIVE 06/13/2021   SARSCOV2NAA NEGATIVE 07/20/2020        Regarding pertinent Chronic problems:     Hyperlipidemia - on statins Lipitor (atorvastatin)  Lipid Panel     Component Value Date/Time   CHOL 117 05/30/2020 1646   TRIG 133 05/30/2020 1646   HDL 24 (L) 05/30/2020 1646   LDLCALC 69 05/30/2020 1646   LABVLDL 24 05/30/2020 1646     HTN on NOrvasc, imdur   chronic CHF diastolic - last echo  Recent Results (from the past 28413 hour(s))  ECHOCARDIOGRAM COMPLETE   Collection Time: 06/25/22 11:33 AM  Result Value   Weight 4,433.89   Height 74    BP 131/59   S' Lateral 3.10   Area-P 1/2 2.61   Narrative      ECHOCARDIOGRAM REPORT      IMPRESSIONS    1. Left ventricular ejection fraction, by estimation, is 55 to 60%. Left ventricular ejection fraction by PLAX is 63 %. The left ventricle has normal function. The left ventricle has no regional wall motion abnormalities. There is mild left ventricular  hypertrophy. Left ventricular diastolic parameters are consistent with Grade II diastolic dysfunction (pseudonormalization). Elevated left ventricular end-diastolic pressure.  2. Right ventricular systolic function is normal. The right ventricular size is normal.  3. Left atrial size was mildly dilated.  4. The mitral valve is degenerative. Trivial mitral valve regurgitation. No evidence of mitral stenosis.  5. The aortic valve is tricuspid. There is mild calcification of the aortic valve. There is mild thickening of the aortic valve. Aortic valve regurgitation is not visualized. Mild aortic valve stenosis.              CAD  - On Aspirin, statin, betablocker, Plavix                 -  followed by cardiology                - last cardiac cath  coronary artery disease angioplasty to the right coronary artery on 10/29/2014    DM 2 -  Lab Results  Component Value Date   HGBA1C 7.0 (H) 11/20/2022   on insulin,    PAD sp bilateral BKA    obesity-   BMI Readings from Last 1 Encounters:  03/27/23 32.10 kg/m    GERD on protonix    OSA - pt now deneis noncompliant with CPAP   BPH - on Flomax   A. 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 No indication for pacemaker   ESRD on TH sat  Lab Results  Component Value Date   CREATININE 5.97 (H) 03/30/2023   CREATININE 5.99 (H) 11/22/2022   CREATININE 5.17 (H) 11/21/2022     BPH - on Flomax,          Chronic anemia - baseline hg Hemoglobin & Hematocrit  Recent Labs    11/20/22 0437 11/21/22 1040 03/30/23 1344  HGB 9.5* 10.8* 10.7*   Iron/TIBC/Ferritin/ %Sat    Component Value Date/Time   IRON 36 (L) 10/03/2020 1320   TIBC 183 (L) 10/03/2020 1320   FERRITIN 284 10/03/2020 1320   IRONPCTSAT 20 10/03/2020 1320    While in ER:    Persistent CP reproducible by palpation Cardiolgy to see pt inAM Rec nitroglycerin drip and heparin drip     Lab Orders         Basic metabolic panel         CBC         Heparin level (unfractionated)         Heparin level (unfractionated)         Heparin level (unfractionated)         APTT         APTT         APTT       CXR -  NON acute   Following Medications were ordered in ER: Medications  nitroGLYCERIN 50 mg in dextrose 5 % 250 mL (0.2 mg/mL) infusion (has no administration in time range)  heparin ADULT infusion 100 units/mL (25000 units/244mL) (has no administration in time range)  nitroGLYCERIN (NITROGLYN) 2 % ointment 0.5 inch (0.5 inches Topical Given 03/30/23 1718)  morphine (PF) 4 MG/ML injection 4 mg (4 mg Intravenous Given 03/30/23 1717)    _______________________________________________________ ER Provider Called:  cardiology    Dr. Jovita Gamma They Recommend admit to medicine   Will see in AM      ED Triage Vitals  Encounter Vitals Group     BP 03/30/23 1326 (!) 166/71     Systolic BP Percentile --      Diastolic BP Percentile --      Pulse Rate 03/30/23 1326 72     Resp 03/30/23 1350 20     Temp 03/30/23 1326 98.1 F (36.7 C)     Temp Source 03/30/23 1815 Oral     SpO2 03/30/23 1326 100 %     Weight --      Height --      Head Circumference --      Peak Flow --      Pain Score 03/30/23 1328 4     Pain Loc --  Pain Education --      Exclude from Growth Chart --   [id number]     _________________________________________ Significant initial  Findings: Abnormal Labs Reviewed  BASIC METABOLIC PANEL -  Abnormal; Notable for the following components:      Result Value   Chloride 97 (*)    Glucose, Bld 171 (*)    BUN 38 (*)    Creatinine, Ser 5.97 (*)    Calcium 8.4 (*)    GFR, Estimated 10 (*)    All other components within normal limits  CBC - Abnormal; Notable for the following components:   RBC 3.52 (*)    Hemoglobin 10.7 (*)    HCT 32.5 (*)    All other components within normal limits  TROPONIN I (HIGH SENSITIVITY) - Abnormal; Notable for the following components:   Troponin I (High Sensitivity) 24 (*)    All other components within normal limits  TROPONIN I (HIGH SENSITIVITY) - Abnormal; Notable for the following components:   Troponin I (High Sensitivity) 26 (*)    All other components within normal limits      _________________________ Troponin  ordered Cardiac Panel (last 3 results) Recent Labs    03/30/23 1344 03/30/23 1652  TROPONINIHS 24* 26*     ECG: Ordered Personally reviewed and interpreted by me showing: HR : 73 Rhythm:Sinus rhythm with 1st degree A-V block Right bundle branch block Left anterior fascicular block   Bifascicular block   Minimal voltage criteria for LVH, may be normal variant ( R in aVL ) Septal infarct , age undetermined Abnormal ECG When compared with ECG of 03-Jul-2022 12:15, Bifascicular block now present QTC 508  BNP (last 3 results) No results for input(s): "BNP" in the last 8760 hours.   COVID-19 Labs  No results for input(s): "DDIMER", "FERRITIN", "LDH", "CRP" in the last 72 hours.  Lab Results  Component Value Date   SARSCOV2NAA NEGATIVE 06/23/2022   SARSCOV2NAA POSITIVE (A) 09/02/2021   SARSCOV2NAA NEGATIVE 06/13/2021   SARSCOV2NAA NEGATIVE 07/20/2020     The recent clinical data is shown below. Vitals:   03/30/23 1350 03/30/23 1725 03/30/23 1728 03/30/23 1815  BP:  (!) 190/80    Pulse:      Resp: 20 17    Temp:    97.9 F (36.6 C)  TempSrc:    Oral  SpO2:   100%      WBC     Component Value  Date/Time   WBC 8.7 03/30/2023 1344   LYMPHSABS 1.2 11/19/2022 2058   MONOABS 0.8 11/19/2022 2058   EOSABS 0.2 11/19/2022 2058   BASOSABS 0.0 11/19/2022 2058       Results for orders placed or performed during the hospital encounter of 11/19/22  Culture, blood (Routine x 2)     Status: None   Collection Time: 11/19/22  9:03 PM   Specimen: BLOOD  Result Value Ref Range Status   Specimen Description BLOOD RIGHT ANTECUBITAL  Final   Special Requests   Final    BOTTLES DRAWN AEROBIC AND ANAEROBIC Blood Culture results may not be optimal due to an excessive volume of blood received in culture bottles   Culture   Final    NO GROWTH 5 DAYS Performed at [facility], [address] [place], [place] [address]    Report Status 11/24/2022 FINAL  Final  Culture, blood (Routine x 2)     Status: None   Collection Time: 11/20/22  9:14 PM   Specimen: BLOOD  Result Value Ref Range Status   Specimen Description BLOOD BLOOD RIGHT ARM  Final   Special Requests   Final    BOTTLES DRAWN AEROBIC AND ANAEROBIC Blood Culture results may not be optimal due to an inadequate volume of blood received in culture bottles   Culture   Final    NO GROWTH 5 DAYS Performed at [facility], [address], [place], [place] [address]    Report Status 11/25/2022 FINAL  Final   *Note: Due to a large number of results and/or encounters for the requested time period, some results have not been displayed. A complete set of results can be found in Results Review.     __________________________________________________________ Recent Labs  Lab 03/30/23 1344  NA 135  K 4.3  CO2 24  GLUCOSE 171*  BUN 38*  CREATININE 5.97*  CALCIUM 8.4*    Cr  stable,  Lab Results  Component Value Date   CREATININE 5.97 (H) 03/30/2023   CREATININE 5.99 (H) 11/22/2022   CREATININE 5.17 (H) 11/21/2022    Recent Labs  Lab 03/30/23 1922  AST 24  ALT 27  ALKPHOS 119  BILITOT 0.4  PROT 6.0*  ALBUMIN 3.0*    Lab Results  Component Value Date   CALCIUM 8.4 (L) 03/30/2023   PHOS 5.2 (H) 11/22/2022        Plt: Lab Results  Component Value Date   PLT 180 03/30/2023      Recent Labs  Lab 03/30/23 1344  WBC 8.7  HGB 10.7*  HCT 32.5*  MCV 92.3  PLT 180    HG/HCT stable,       Component Value Date/Time   HGB 10.7 (L) 03/30/2023 1344   HGB 11.5 (L) 05/30/2020 1646   HCT 32.5 (L) 03/30/2023 1344   HCT 35.0 (L) 05/30/2020 1646   MCV 92.3 03/30/2023 1344   MCV 86 05/30/2020 1646    _______________________________________________ Hospitalist was called for admission for   Unstable angina   Chest pain,  accelerate HTn   The following Work up has been ordered so far:  Orders Placed This Encounter  Procedures   DG Chest 2 View   Basic metabolic panel   CBC   Heparin level (unfractionated)   Heparin level (unfractionated)   Heparin level (unfractionated)   APTT   APTT   APTT   Document Height and Actual Weight   Consult to nephrology   Inpatient consult to Cardiology  Dr.Ganji primary cardiologist   heparin per pharmacy consult   ED EKG     OTHER Significant initial  Findings:  labs showing:     DM  labs:  HbA1C: Recent Labs    11/20/22 0437  HGBA1C 7.0*       CBG (last 3)  No results for input(s): "GLUCAP" in the last 72 hours.        Cultures:    Component Value Date/Time   SDES BLOOD BLOOD RIGHT ARM 11/20/2022 2114   SPECREQUEST  11/20/2022 2114    BOTTLES DRAWN AEROBIC AND ANAEROBIC Blood Culture results may not be optimal due to an inadequate volume of blood received in culture bottles   CULT  11/20/2022 2114    NO GROWTH 5 DAYS Performed at [facility], [address] [place], [place] [address]    REPTSTATUS 11/25/2022 FINAL 11/20/2022 2114     Radiological Exams on Admission: DG Chest 2 View  Result Date: 03/30/2023 CLINICAL DATA:  Left-sided chest pain with shortness  of breath for 3 days. Dialysis patient. EXAM: CHEST - 2 VIEW  COMPARISON:  Radiographs 11/19/2022 and 06/23/2022. Chest CT 06/23/2022. FINDINGS: The heart size and mediastinal contours are stable. There is aortic atherosclerosis. Interval improved aeration of both lungs without evidence of focal airspace disease, edema, pleural effusion or pneumothorax. Probable mild chronic central airway thickening. Previous lower cervical fusion noted. No acute osseous findings are seen. IMPRESSION: No evidence of acute cardiopulmonary process. Interval improved aeration of both lungs. Electronically Signed   By: Carey Bullocks M.D.   On: 03/30/2023 14:16   _______________________________________________________________________________________________________ Latest  Blood pressure (!) 190/80, pulse 72, temperature 97.9 F (36.6 C), temperature source Oral, resp. rate 17, SpO2 100%.   Vitals  labs and radiology finding personally reviewed  Review of Systems:    Pertinent positives include:  fatigue, chest pain Constitutional:  No weight loss, night sweats, Fevers, chills,  weight loss  HEENT:  No headaches, Difficulty swallowing,Tooth/dental problems,Sore throat,  No sneezing, itching, ear ache, nasal congestion, post nasal drip,  Cardio-vascular:  No , Orthopnea, PND, anasarca, dizziness, palpitations.no Bilateral lower extremity swelling  GI:  No heartburn, indigestion, abdominal pain, nausea, vomiting, diarrhea, change in bowel habits, loss of appetite, melena, blood in stool, hematemesis Resp:  no shortness of breath at rest. No dyspnea on exertion, No excess mucus, no productive cough, No non-productive cough, No coughing up of blood.No change in color of mucus.No wheezing. Skin:  no rash or lesions. No jaundice GU:  no dysuria, change in color of urine, no urgency or frequency. No straining to urinate.  No flank pain.  Musculoskeletal:  No joint pain or no joint swelling. No decreased range of motion. No back pain.  Psych:  No change in mood or affect. No  depression or anxiety. No memory loss.  Neuro: no localizing neurological complaints, no tingling, no weakness, no double vision, no gait abnormality, no slurred speech, no confusion  All systems reviewed and apart from HOPI all are negative _______________________________________________________________________________________________ Past Medical History:   Past Medical History:  Diagnosis Date   Carotid artery occlusion 11/10/10   LEFT CAROTID ENDARTERECTOMY   Chronic kidney disease    Complication of anesthesia    BP WENT UP AT [facility] "   COPD (chronic obstructive pulmonary disease) (HCC)    pt denies this dx as of 06/01/20 - no inhaler    Diabetes mellitus without complication (HCC)    Diverticulitis    Diverticulosis of colon (without mention of hemorrhage)    DJD (degenerative joint disease)    knees/hands/feet/back/neck   Fatty liver    Full dentures    GERD (gastroesophageal reflux disease)    H/O hiatal hernia    History of blood transfusion    with a past surical procedure per patient 06/01/20   Hyperlipidemia    Hypertension    Neuromuscular disorder (HCC)    peripheral neuropathy   Non-pressure chronic ulcer of other part of left foot limited to breakdown of skin (HCC) 11/12/2016   Osteomyelitis (HCC)    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 Atherectomy left popliteal artery July 2012.    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   Unstable angina (HCC) 09/16/2018   Wears glasses       Past Surgical  History:  Procedure Laterality Date   ABDOMINAL AORTOGRAM W/LOWER EXTREMITY N/A 06/23/2021   Procedure: ABDOMINAL AORTOGRAM W/LOWER EXTREMITY;  Surgeon: Elder Negus, MD;  [facility]: [facility];  Service: Cardiovascular;  Laterality: N/A;   AMPUTATION  11/05/2011   Procedure: AMPUTATION  RAY;  Surgeon: Toni Arthurs, MD;  Location: [facility] OR;  Service: Orthopedics;  Laterality: Right;  Amputation of Right 4&5th Toes   AMPUTATION Left 11/26/2012   Procedure: AMPUTATION RAY;  Surgeon: Toni Arthurs, MD;  Location: [facility] OR;  Service: Orthopedics;  Laterality: Left;  fourth ray amputation   AMPUTATION Right 08/27/2014   Procedure: Transmetatarsal Amputation;  Surgeon: Nadara Mustard, MD;  Location: [facility] OR;  Service: Orthopedics;  Laterality: Right;   AMPUTATION Right 01/14/2015   Procedure: AMPUTATION BELOW KNEE;  Surgeon: Nadara Mustard, MD;  Location: [facility];  Service: Orthopedics;  Laterality: Right;   AMPUTATION Left 10/21/2015   Procedure: Left Foot 5th Ray Amputation;  Surgeon: Nadara Mustard, MD;  Location: [facility] OR;  Service: Orthopedics;  Laterality: Left;   AMPUTATION Left 07/04/2022   Procedure: LEFT BELOW KNEE AMPUTATION;  Surgeon: Nadara Mustard, MD;  Location: [facility] OR;  Service: Orthopedics;  Laterality: Left;   ANTERIOR FUSION CERVICAL SPINE  02/06/06   C4-5, C5-6, C6-7; SURGEON DR. MAX COHEN   AV FISTULA PLACEMENT Left 06/02/2020   Procedure: ARTERIOVENOUS (AV) FISTULA CREATION LEFT;  Surgeon: Maeola Harman, MD;  Location: [facility] OR;  Service: Vascular;  Laterality: Left;   BACK SURGERY     x 3   BASCILIC VEIN TRANSPOSITION Left 07/21/2020   Procedure: LEFT UPPER ARM ATERIOVENOUS SUPERFISTULALIZATION;  Surgeon: Maeola Harman, MD;  Location: [facility] OR;  Service: Vascular;  Laterality: Left;   BELOW KNEE LEG AMPUTATION Right    CARDIAC CATHETERIZATION  10/31/04   2009   CAROTID ENDARTERECTOMY  11/10/10   CAROTID ENDARTERECTOMY Left 11/10/2010   Subtotal occlusion of left internal carotid artery with left hemispheric transient ischemic attacks.   CAROTID STENT     CARPAL TUNNEL RELEASE Right 10/21/2013   Procedure: RIGHT CARPAL TUNNEL RELEASE;  Surgeon: Nicki Reaper, MD;  Location: [facility];  Service: Orthopedics;  Laterality: Right;   CHOLECYSTECTOMY     COLON  SURGERY     COLONOSCOPY     COLOSTOMY REVERSAL  05/21/2018   ileostomy reversal   CYSTOSCOPY WITH STENT PLACEMENT Bilateral 01/13/2018   Procedure: CYSTOSCOPY WITH BILATERAL URETERAL CATHETER PLACEMENT;  Surgeon: Crist Fat, MD;  Location: [facility];  Service: Urology;  Laterality: Bilateral;   ESOPHAGEAL MANOMETRY Bilateral 07/19/2014   Procedure: ESOPHAGEAL MANOMETRY (EM);  Surgeon: Beverley Fiedler, MD;  Location: [facility];  Service: Gastroenterology;  Laterality: Bilateral;   EYE SURGERY Bilateral 2020   cataract   FEMORAL ARTERY STENT     x6   FINGER SURGERY     FOOT SURGERY  04/25/2016    EXCISION BASE 5TH METATARSAL AND PARTIAL CUBOID LEFT FOOT   HERNIA REPAIR     LEFT INGUINAL AND UMBILICAL REPAIRS   HERNIA REPAIR     I & D EXTREMITY Left 04/25/2016   Procedure: EXCISION BASE 5TH METATARSAL AND PARTIAL CUBOID LEFT FOOT;  Surgeon: Nadara Mustard, MD;  [facility];  Service: Orthopedics;  Laterality: Left;   ILEOSTOMY  01/13/2018   Procedure: ILEOSTOMY;  Surgeon: Berna Bue, MD;  Location: [facility];  Service: General;;   ILEOSTOMY CLOSURE N/A 05/21/2018   Procedure: ILEOSTOMY REVERSAL ERAS  PATHWAY;  Surgeon: Berna Bue, MD;  Location: [facility] OR;  Service: General;  Laterality: N/A;   IR RADIOLOGIST EVAL & MGMT  11/19/2017   IR RADIOLOGIST EVAL & MGMT  12/03/2017   IR RADIOLOGIST EVAL & MGMT  12/18/2017   JOINT REPLACEMENT Right 2001   Total knee   LAMINECTOMY     X 3 LUMBAR AND X 2 CERVICAL SPINE OPERATIONS   LAPAROSCOPIC CHOLECYSTECTOMY W/ CHOLANGIOGRAPHY  11/09/04   SURGEON DR. Caleen Essex   LEFT HEART CATH AND CORONARY ANGIOGRAPHY N/A 09/16/2018   Procedure: LEFT HEART CATH AND CORONARY ANGIOGRAPHY;  Surgeon: Elder Negus, MD;  [facility];  Service: Cardiovascular;  Laterality: N/A;   LEFT HEART CATHETERIZATION WITH CORONARY ANGIOGRAM N/A 10/29/2014   Procedure: LEFT HEART CATHETERIZATION WITH CORONARY ANGIOGRAM;  Surgeon: Pamella Pert,  MD;  Location[facility];  Service: Cardiovascular;  Laterality: N/A;   LIGATION OF COMPETING BRANCHES OF ARTERIOVENOUS FISTULA Left 07/21/2020   Procedure: LIGATION OF COMPETING BRANCHES OF LEFT UPPER ARM ARTERIOVENOUS FISTULA;  Surgeon: Maeola Harman, MD;  Location: [facility] OR;  Service: Vascular;  Laterality: Left;   LOWER EXTREMITY ANGIOGRAM N/A 03/19/2012   Procedure: LOWER EXTREMITY ANGIOGRAM;  Surgeon: Kathleene Hazel, MD;  Location: [facility] CATH LAB;  Service: Cardiovascular;  Laterality: N/A;   LOWER EXTREMITY ANGIOGRAPHY N/A 06/20/2021   Procedure: LOWER EXTREMITY ANGIOGRAPHY;  Surgeon: Elder Negus, MD;  [facility];  Service: Cardiovascular;  Laterality: N/A;   NECK SURGERY     PARTIAL COLECTOMY N/A 01/13/2018   Procedure: LAPAROSCOPIC ASSISTED   SIGMOID COLECTOMY ILEOSTOMY;  Surgeon: Berna Bue, MD;  Location: [facility];  Service: General;  Laterality: N/A;   PENILE PROSTHESIS IMPLANT  08/14/05   INFRAPUBIC INSERTION OF INFLATABLE PENILE PROSTHESIS; SURGEON DR. Logan Bores   PENILE PROSTHESIS IMPLANT     PERCUTANEOUS CORONARY STENT INTERVENTION (PCI-S) Right 10/29/2014   Procedure: PERCUTANEOUS CORONARY STENT INTERVENTION (PCI-S);  Surgeon: Pamella Pert, MD;  Location: [facility] CATH LAB;  Service: Cardiovascular;  Laterality: Right;   PERIPHERAL VASCULAR INTERVENTION Left 06/23/2021   Procedure: PERIPHERAL VASCULAR INTERVENTION;  Surgeon: Elder Negus, MD;  [facility];  Service: Cardiovascular;  Laterality: Left;   REMOVAL OF PENILE PROSTHESIS N/A 06/14/2021   Procedure: Removal of THREE piece inflatable penile prosthesis;  Surgeon: Crista Elliot, MD;  Location: [facility] OR;  Service: Urology;  Laterality: N/A;   SHOULDER ARTHROSCOPY     SPINE SURGERY     TOE AMPUTATION Left    TONSILLECTOMY     TOTAL KNEE ARTHROPLASTY  07/2002   RIGHT KNEE ; SURGEON  DR. [name] ALSO HAD ARTHROSCOPIC RIGHT KNEE IN  10/2001   TOTAL KNEE  ARTHROPLASTY     ULNAR NERVE TRANSPOSITION Right 10/21/2013   Procedure: RIGHT ELBOW  ULNAR NERVE DECOMPRESSION;  Surgeon: Nicki Reaper, MD;  Location: [facility];  Service: Orthopedics;  Laterality: Right;    Social History:  Ambulatory  wheelchair bound,      reports that he has been smoking cigarettes. He has a 17.5 pack-year smoking history. He has never used smokeless tobacco. He reports that he does not currently use alcohol. He reports that he does not use drugs.     Family History:   Family History  Problem Relation Age of Onset   Heart disease Father        Before age 4-  CAD, BPG  Diabetes Father        Amputation   Cancer Father        PROSTATE   Hyperlipidemia Father    Hypertension Father    Heart attack Father        Triple BPG   Varicose Veins Father    Cancer Sister        Breast   Hyperlipidemia Sister    Hypertension Sister    Heart attack Brother    Colon cancer Brother    Diabetes Brother    Heart disease Brother 41       A-Fib. Before age 25   Hyperlipidemia Brother    Hypertension Brother    Hypertension Son    Arthritis Other        GRANDMOTHER   Hypertension Other        OTHER FAMILY MEMBERS   ______________________________________________________________________________________________ Allergies: Allergies  Allergen Reactions   Contrast Media [Iodinated Contrast Media] Shortness Of Breath and Other (See Comments)    Difficulty breathing and altered mental status     Ivp Dye [Iodinated Contrast Media] Anaphylaxis, Shortness Of Breath and Other (See Comments)    Breathing problems, altered mental state    Adhesive [Tape] Rash and Other (See Comments)    Rash after 1 day of use Do not leave on longer then 3 days with out be changed   Latex Rash and Other (See Comments)    A severe rash appears after the first 24 hours of being placed     Prior to Admission medications   Medication Sig Start Date End Date Taking?  Authorizing Provider  amiodarone (PACERONE) 200 MG tablet Take 1 tablet (200 mg total) by mouth daily. Patient taking differently: Take 100 mg by mouth daily. 10/29/22   Yates Decamp, MD  amLODipine (NORVASC) 10 MG tablet Take 1 tablet (10 mg total) by mouth daily. 01/21/23   Yates Decamp, MD  apixaban (ELIQUIS) 5 MG TABS tablet Take 1 tablet (5 mg total) by mouth 2 (two) times daily. 01/14/22 03/27/23  Almon Hercules, MD  atorvastatin (LIPITOR) 40 MG tablet Take 40 mg by mouth at bedtime.     [provider]  B Complex-C-Zn-Folic Acid (DIALYVITE/ZINC) TABS Take 1 tablet by mouth Every Tuesday,Thursday,and Saturday with dialysis. 10/04/21   [provider]  famotidine (PEPCID) 20 MG tablet Take 20 mg by mouth every other day.    [provider]  fentaNYL (DURAGESIC) 50 MCG/HR Place 1 patch onto the skin every 3 (three) days. 11/23/22   Dorcas Carrow, MD  folic acid (FOLVITE) 1 MG tablet Take 1 mg by mouth daily. 09/18/21   [provider]  Glucosamine HCl (GLUCOSAMINE PO) Take 500 mg by mouth 2 (two) times daily.    [provider]  insulin aspart (NOVOLOG FLEXPEN) 100 UNIT/ML FlexPen Inject 0-12 Units into the skin See admin instructions. Inject as per sliding scale: If 0 - 69 = 0 initiate hypoglycemia orders;  70 - 150 = 0;  151 - 200 = 2;  201 - 250 = 4;  251 - 300 = 6;  301 - 350 = 8;  351 - 400 = 10;  401 - 450 = 12.  If >400, give 12 units, recheck in 1 hour. If still >400, call MD/NP for further orders. Subcutaneously with meals in addition to scheduled 4 unites with meals.    [provider]  insulin glargine (LANTUS) 100 UNIT/ML injection Inject 0.2 mLs (20 Units total) into the  skin at bedtime. 06/28/22   Zigmund Daniel., MD  isosorbide mononitrate (IMDUR) 60 MG 24 hr tablet Take 1 tablet (60 mg total) by mouth daily. 01/21/23 04/21/23  Yates Decamp, MD  lidocaine-prilocaine (EMLA) cream Apply 1 application topically See admin instructions.  Prior to Dialysis days Tuesday,Thursday and saturday 06/06/21   [provider]  loratadine (CLARITIN) 10 MG tablet Take 10 mg by mouth daily.    [provider]  metoCLOPramide (REGLAN) 5 MG tablet Take 1 tablet (5 mg total) by mouth 4 (four) times daily -  before meals and at bedtime. Patient taking differently: Take 5 mg by mouth in the morning, at noon, in the evening, and at bedtime. 01/14/22 03/27/23  Almon Hercules, MD  NEEDLE, REUSABLE, 22 G 22G X 1-1/2" MISC 1 Units by Does not apply route as directed. For B12 IM inj 02/15/18   Elgergawy, Leana Roe, MD  nitroGLYCERIN (NITROSTAT) 0.4 MG SL tablet Place 0.4 mg under the tongue every 5 (five) minutes as needed for chest pain. 01/06/22   [provider]  omega-3 acid ethyl esters (LOVAZA) 1 g capsule Take 2 g by mouth 2 (two) times daily.    [provider]  pantoprazole (PROTONIX) 40 MG tablet Take 1 tablet (40 mg total) by mouth 2 (two) times daily for 30 days, THEN 1 tablet (40 mg total) daily. Patient taking differently:  1 tablet (40 mg total) twice daily. 01/14/22 03/27/23  Almon Hercules, MD  Polyvinyl Alcohol-Povidone PF 1.4-0.6 % SOLN Place 1 drop into both eyes 2 (two) times daily as needed (dry eyes).     [provider]  pregabalin (LYRICA) 100 MG capsule Take 1 capsule (100 mg total) by mouth 2 (two) times daily for 5 days. 11/23/22 03/27/23  Dorcas Carrow, MD  rOPINIRole (REQUIP) 2 MG tablet Take 2 mg by mouth at bedtime.    [provider]  senna-docusate (SENOKOT-S) 8.6-50 MG tablet Take 2 tablets by mouth at bedtime.    [provider]  Syringe/Needle, Disp, (SYRINGE 3CC/22GX1-1/2") 22G X 1-1/2" 3 ML MISC 1 Syringe by Does not apply route as directed. For b12 IM inj 02/15/18   Elgergawy, Leana Roe, MD  tamsulosin (FLOMAX) 0.4 MG CAPS capsule Take 0.4 mg by mouth daily.    [provider]     ___________________________________________________________________________________________________ Physical Exam:    03/30/2023    5:25 PM 03/30/2023    1:26 PM 03/27/2023    2:55 PM  Vitals with BMI  Height   6\' 2"   Weight   250 lbs  BMI   32.08  Systolic 190 166 409  Diastolic 80 71 63  Pulse  72 75     1. General:  in No  Acute distress   Chronically ill   -appearing 2. Psychological: Alert and   Oriented 3. Head/ENT:     Dry Mucous Membranes                          Head Non traumatic, neck supple                          Poor Dentition 4. SKIN:  decreased Skin turgor,  Skin clean Dry and intact no rash    5. Heart: Regular rate and rhythm no  Murmur, no Rub or gallop 6. Lungs:   no wheezes or crackles   7. Abdomen: Soft,  non-tender,  Non distended   obese  bowel sounds present 8. Lower extremities: no clubbing, cyanosis, bilateral BKA 9. Neurologically Grossly intact, moving all 4 extremities equally   10. MSK: Normal range of motion    Chart has been reviewed  ______________________________________________________________________________________________  Assessment/Plan 67 y.o. male with medical history significant of ESRD, DM2, CAP, PAD sp bilateral BKA multiple peripheral interventions in the past,   severe diabetic retinopathy,, right carotid endarterectomy in 2012 , OSA Unable to tolerate CPAP, chronic back pain and neck and back surgery in past and on chronic pain medications, paroxysmal atrial fibrillation on Eliquis.      for   Unstable angina    Chest pain, unspecified type     Present on Admission:  Unstable angina (HCC)  Accelerated hypertension  CAD (dz of distal, mid and proximal RCA with implantation of 3 overlapping drug-eluting stent,)  Chronic diastolic heart failure, NYHA class 2 (HCC)  PAD (peripheral artery disease) (HCC)  Atrial fibrillation, chronic (HCC)  GERD (gastroesophageal reflux disease)  Normocytic anemia  Chronic  obstructive pulmonary disease, unspecified (HCC)  OSA not tolerating CPAP  Chest wall pain  Tobacco abuse  Prolonged QT interval  Elevated troponin     Accelerated hypertension Resume home medications. Plan to hemodialyzed tomorrow as patient skipped hemodialysis today.  Secondary to chest pain. This should help her blood pressure as well Patient currently on nitro drip for chest pain but that should also help her blood pressure management  ESRD on hemodialysis (HCC) You for allergy was notified by ER that patient has been admitted plan for hemodialysis tomorrow  CAD (dz of distal, mid and proximal RCA with implantation of 3 overlapping drug-eluting stent,) Cardiology is aware the patient is being admitted Given chest pain would like to start on heparin drip as well as nitro drip Make sure he is on aspirin daily Check lipid panel and LPA  Chronic diastolic heart failure, NYHA class 2 (HCC) Currently stable euvolemic.  Fluid management as per nephrology  Insulin dependent type 2 diabetes mellitus (HCC)  - Order Sensitive  SSI   - continue home insulin but decreased to  10units,  -  check TSH and HgA1C  - Hold by mouth medications    PAD (peripheral artery disease) (HCC) Now status post bilateral BKA  Atrial fibrillation, chronic (HCC) Resume amiodarone 200 mg daily Hold Eliquis while on heparin  GERD (gastroesophageal reflux disease) Chronic stable continue Protonix  Unstable angina (HCC) Chest pain with some elements of musculoskeletal but cannot rule out unstable angina.  Will continue to cycle cardiac enzymes so far appears to be stable.  Appreciate cardiology consult. Continue heparin and nitro as per cardiology. Daily aspirin 81 mg daily  Normocytic anemia Chronic stable obtain anemia panel  S/P BKA (below knee amputation), right (HCC) Chronic stable  Chronic obstructive pulmonary disease, unspecified (HCC) Stable continue home medications encourage tobacco  cessation  OSA not tolerating CPAP Patient denies having sleep apnea not on CPAP  Chest wall pain Chest pain seems to be reproducible palpation.  Pain management as needed.  Given risk factors appreciate cardiology consult and recommendations  Tobacco abuse  - Spoke about importance of quitting spent 5 minutes discussing options for treatment, prior attempts at quitting, and dangers of smoking  -At this point patient is    interested in quitting  - refused nicotine patch   - nursing tobacco cessation protocol   Prolonged QT interval - will monitor on tele avoid QT prolonging medications, rehydrate correct electrolytes  Elevated troponin   chest pain no EKG changes in the setting of chronic kidney disease likely due to demand ischemia and poor clearance, monitor on telemetry and cycle cardiac enzymes to trend.  ECHO in AM Cardiology is aware On heparin    Other plan as per orders.  DVT prophylaxis:  heparin   Code Status:    Code Status: Prior FULL CODE  as per patient   I had personally discussed CODE STATUS with patient   ACP none   Family Communication:   Family not at  Bedside    Diet reanal/diabetic   Disposition Plan:                             Back to current facility when stable                               Following barriers for discharge:                            Electrolytes corrected                               Anemia corrected                             Pain controlled with PO medications                               Afebrile, white count improving able to transition to PO antibiotics                             Will need to be able to tolerate PO                            Will likely need home health, home O2, set up                           Will need consultants to evaluate patient prior to discharge                        Diabetes care coordinator                                       Nutrition    consulted                                     Consults called:  Cardiology Dr. Jovita Gamma Nephrology Dr. Marisue Humble   Admission status:  ED Disposition     ED Disposition  Admit   Condition  --   Comment  Hospital Area: [facility] [[id number]]  Level of Care: Progressive [102]  Admit to Progressive based on following criteria: CARDIOVASCULAR & THORACIC of moderate stability with acute coronary syndrome symptoms/low risk myocardial infarction/hypertensive urgency/arrhythmias/heart failure potentially compromising stability and stable post cardiovascular intervention patients.  Admit  to Progressive based on following criteria: NEPHROLOGY stable condition requiring close monitoring for AKI, requiring Hemodialysis or Peritoneal Dialysis either from expected electrolyte imbalance, acidosis, or fluid overload that can be managed by NIPPV or high flow oxygen.  May place patient in observation at [facility] or [facility] if equivalent level of care is available:: No  Covid Evaluation: Asymptomatic - no recent exposure (last 10 days) testing not required  Diagnosis: Unstable angina [facility]) [[id number]]  Admitting Physician: Therisa Doyne [[id number]]  Attending Physician: Therisa Doyne [[id number]]           Obs     Level of care        progressive tele indefinitely please discontinue once patient no longer qualifies COVID-19 Labs     Ashonte Angelucci 03/30/2023, 9:37 PM    [facility]     after 2 AM please page floor coverage PA If 7AM-7PM, please contact the day team taking care of the patient using [email or web address]

## 2023-03-30 NOTE — Assessment & Plan Note (Signed)
Stable continue home medications encourage tobacco cessation

## 2023-03-30 NOTE — Assessment & Plan Note (Signed)
-  will monitor on tele avoid QT prolonging medications, rehydrate correct electrolytes ? ?

## 2023-03-30 NOTE — Progress Notes (Signed)
ON-CALL CARDIOLOGY 03/30/23  Patient's name: [name].   MRN: [id number].    DOB: 1956-01-16 Primary care provider: Lonie Peak, PA-C. Primary cardiologist: Dr. Yates Decamp  Interaction regarding this patient's care today: Presents to the ED via EMS for chest pain and shortness of breath from his dialysis center  Dialysis was not performed due to his symptoms of chest pain.  He has been having substernal discomfort, at times brought on by effort, improves with sublingual nitroglycerin but no resolution.  EKG shows sinus rhythm with first-degree AV block, right bundle branch block, left anterior fascicular block.  No acute STEMI  High sensitive troponins are essentially flat likely secondary to supply demand ischemia in the setting of hypertensive crisis.  Impression: Unstable angina Severe PAD status post bilateral amputations CAD with prior coronary interventions Diabetes with complications of end-stage renal disease, retinopathy, etc. Cigarette smoker  Recommendations: Though symptoms may be secondary to supply demand ischemia in the setting of hypertensive crisis he presents with chest pain concerning for unstable angina in the setting of multiple cardiovascular risk factors.  Recommend IV heparin per ACS protocol  IV nitroglycerin drip for chest pain and blood pressure management.  Full cardiology consultation tentatively planned for tomorrow  Remaining management per primary team  Consult requested by Peggyann Shoals PA-C whom I spoke to over the phone  Please reach out if any questions or concerns arise.  Tessa Lerner, [place], [facility]  Pager:  [phone] Office: [phone]

## 2023-03-31 ENCOUNTER — Observation Stay (HOSPITAL_BASED_OUTPATIENT_CLINIC_OR_DEPARTMENT_OTHER): Payer: Medicare HMO

## 2023-03-31 DIAGNOSIS — Z89511 Acquired absence of right leg below knee: Secondary | ICD-10-CM

## 2023-03-31 DIAGNOSIS — I5032 Chronic diastolic (congestive) heart failure: Secondary | ICD-10-CM

## 2023-03-31 DIAGNOSIS — I161 Hypertensive emergency: Secondary | ICD-10-CM | POA: Diagnosis not present

## 2023-03-31 DIAGNOSIS — E119 Type 2 diabetes mellitus without complications: Secondary | ICD-10-CM | POA: Diagnosis not present

## 2023-03-31 DIAGNOSIS — I482 Chronic atrial fibrillation, unspecified: Secondary | ICD-10-CM | POA: Diagnosis not present

## 2023-03-31 DIAGNOSIS — Z79899 Other long term (current) drug therapy: Secondary | ICD-10-CM | POA: Diagnosis not present

## 2023-03-31 DIAGNOSIS — I169 Hypertensive crisis, unspecified: Secondary | ICD-10-CM | POA: Diagnosis not present

## 2023-03-31 DIAGNOSIS — I480 Paroxysmal atrial fibrillation: Secondary | ICD-10-CM

## 2023-03-31 DIAGNOSIS — R072 Precordial pain: Secondary | ICD-10-CM

## 2023-03-31 DIAGNOSIS — Z955 Presence of coronary angioplasty implant and graft: Secondary | ICD-10-CM | POA: Diagnosis not present

## 2023-03-31 DIAGNOSIS — Z91040 Latex allergy status: Secondary | ICD-10-CM | POA: Diagnosis not present

## 2023-03-31 DIAGNOSIS — I10 Essential (primary) hypertension: Secondary | ICD-10-CM

## 2023-03-31 DIAGNOSIS — I5030 Unspecified diastolic (congestive) heart failure: Secondary | ICD-10-CM

## 2023-03-31 DIAGNOSIS — Z89512 Acquired absence of left leg below knee: Secondary | ICD-10-CM | POA: Diagnosis not present

## 2023-03-31 DIAGNOSIS — J449 Chronic obstructive pulmonary disease, unspecified: Secondary | ICD-10-CM | POA: Diagnosis not present

## 2023-03-31 DIAGNOSIS — I120 Hypertensive chronic kidney disease with stage 5 chronic kidney disease or end stage renal disease: Secondary | ICD-10-CM | POA: Diagnosis not present

## 2023-03-31 DIAGNOSIS — G4733 Obstructive sleep apnea (adult) (pediatric): Secondary | ICD-10-CM | POA: Diagnosis not present

## 2023-03-31 DIAGNOSIS — Z794 Long term (current) use of insulin: Secondary | ICD-10-CM | POA: Diagnosis not present

## 2023-03-31 DIAGNOSIS — I2510 Atherosclerotic heart disease of native coronary artery without angina pectoris: Secondary | ICD-10-CM | POA: Diagnosis not present

## 2023-03-31 DIAGNOSIS — Z992 Dependence on renal dialysis: Secondary | ICD-10-CM | POA: Diagnosis not present

## 2023-03-31 DIAGNOSIS — E1122 Type 2 diabetes mellitus with diabetic chronic kidney disease: Secondary | ICD-10-CM | POA: Diagnosis not present

## 2023-03-31 DIAGNOSIS — Z7901 Long term (current) use of anticoagulants: Secondary | ICD-10-CM | POA: Diagnosis not present

## 2023-03-31 DIAGNOSIS — N186 End stage renal disease: Secondary | ICD-10-CM

## 2023-03-31 DIAGNOSIS — I25110 Atherosclerotic heart disease of native coronary artery with unstable angina pectoris: Secondary | ICD-10-CM | POA: Diagnosis not present

## 2023-03-31 LAB — GLUCOSE, CAPILLARY
Glucose-Capillary: 107 mg/dL — ABNORMAL HIGH (ref 70–99)
Glucose-Capillary: 123 mg/dL — ABNORMAL HIGH (ref 70–99)
Glucose-Capillary: 103 mg/dL — ABNORMAL HIGH (ref 70–99)
Glucose-Capillary: 152 mg/dL — ABNORMAL HIGH (ref 70–99)
Glucose-Capillary: 117 mg/dL — ABNORMAL HIGH (ref 70–99)

## 2023-03-31 LAB — CBC
HCT: 29.2 % — ABNORMAL LOW (ref 39.0–52.0)
Hemoglobin: 9.8 g/dL — ABNORMAL LOW (ref 13.0–17.0)
MCH: 30.8 pg (ref 26.0–34.0)
MCHC: 33.6 g/dL (ref 30.0–36.0)
MCV: 91.8 fL (ref 80.0–100.0)
Platelets: 166 10*3/uL (ref 150–400)
RBC: 3.18 MIL/uL — ABNORMAL LOW (ref 4.22–5.81)
RDW: 13.1 % (ref 11.5–15.5)
WBC: 7.6 10*3/uL (ref 4.0–10.5)
nRBC: 0 % (ref 0.0–0.2)

## 2023-03-31 LAB — LIPID PANEL
Cholesterol: 104 mg/dL (ref 0–200)
HDL: 23 mg/dL — ABNORMAL LOW (ref >40)
LDL Cholesterol: 54 mg/dL (ref 0–99)
Total CHOL/HDL Ratio: 4.5 RATIO
Triglycerides: 135 mg/dL (ref <150)
VLDL: 27 mg/dL (ref 0–40)

## 2023-03-31 LAB — PREALBUMIN: Prealbumin: 23 mg/dL (ref 18–38)

## 2023-03-31 LAB — ECHOCARDIOGRAM COMPLETE
AR max vel: 2.87 cm2
AV Area VTI: 3.15 cm2
AV Area mean vel: 2.99 cm2
AV Mean grad: 8 mmHg
AV Peak grad: 14.7 mmHg
Ao pk vel: 1.92 m/s
Area-P 1/2: 3.27 cm2
MV VTI: 3.74 cm2
S' Lateral: 3 cm
Weight: 3887.15 oz

## 2023-03-31 LAB — D-DIMER, QUANTITATIVE: D-Dimer, Quant: 0.42 ug/mL-FEU (ref 0.00–0.50)

## 2023-03-31 LAB — COMPREHENSIVE METABOLIC PANEL
ALT: 25 U/L (ref 0–44)
AST: 21 U/L (ref 15–41)
Albumin: 2.7 g/dL — ABNORMAL LOW (ref 3.5–5.0)
Alkaline Phosphatase: 108 U/L (ref 38–126)
Anion gap: 17 — ABNORMAL HIGH (ref 5–15)
BUN: 46 mg/dL — ABNORMAL HIGH (ref 8–23)
CO2: 26 mmol/L (ref 22–32)
Calcium: 8.6 mg/dL — ABNORMAL LOW (ref 8.9–10.3)
Chloride: 95 mmol/L — ABNORMAL LOW (ref 98–111)
Creatinine, Ser: 6.32 mg/dL — ABNORMAL HIGH (ref 0.61–1.24)
GFR, Estimated: 9 mL/min — ABNORMAL LOW (ref >60)
Glucose, Bld: 106 mg/dL — ABNORMAL HIGH (ref 70–99)
Potassium: 3.9 mmol/L (ref 3.5–5.1)
Sodium: 138 mmol/L (ref 135–145)
Total Bilirubin: 0.5 mg/dL (ref 0.3–1.2)
Total Protein: 5.4 g/dL — ABNORMAL LOW (ref 6.5–8.1)

## 2023-03-31 LAB — PHOSPHORUS: Phosphorus: 5.8 mg/dL — ABNORMAL HIGH (ref 2.5–4.6)

## 2023-03-31 LAB — APTT: aPTT: 51 seconds — ABNORMAL HIGH (ref 24–36)

## 2023-03-31 LAB — MRSA NEXT GEN BY PCR, NASAL: MRSA by PCR Next Gen: DETECTED — AB

## 2023-03-31 LAB — MAGNESIUM: Magnesium: 1.8 mg/dL (ref 1.7–2.4)

## 2023-03-31 LAB — HEPATITIS B SURFACE ANTIGEN: Hepatitis B Surface Ag: NONREACTIVE

## 2023-03-31 LAB — HEPARIN LEVEL (UNFRACTIONATED): Heparin Unfractionated: >1.1 IU/mL — ABNORMAL HIGH (ref 0.30–0.70)

## 2023-03-31 LAB — TROPONIN I (HIGH SENSITIVITY): Troponin I (High Sensitivity): 26 ng/L — ABNORMAL HIGH (ref <18)

## 2023-03-31 MED ORDER — METOPROLOL SUCCINATE ER 25 MG PO TB24: 25.0000 mg | ORAL_TABLET | Freq: Every day | ORAL | Status: DC

## 2023-03-31 MED ORDER — APIXABAN 5 MG PO TABS
5.0000 mg | ORAL_TABLET | Freq: Two times a day (BID) | ORAL | Status: DC
  Administered 2023-03-31 – 2023-04-01 (×2): 5 mg via ORAL
  Filled 2023-03-31 (×2): qty 1

## 2023-03-31 MED ORDER — ISOSORBIDE MONONITRATE ER 60 MG PO TB24: 60.0000 mg | ORAL_TABLET | Freq: Two times a day (BID) | ORAL | Status: DC

## 2023-03-31 MED ORDER — MUPIROCIN 2 % EX OINT
1.0000 | TOPICAL_OINTMENT | Freq: Two times a day (BID) | CUTANEOUS | Status: DC
  Administered 2023-03-31 – 2023-04-01 (×3): 1 via NASAL
  Filled 2023-03-31 (×2): qty 22

## 2023-03-31 MED ORDER — FENTANYL 50 MCG/HR TD PT72
1.0000 | MEDICATED_PATCH | TRANSDERMAL | Status: DC
  Administered 2023-03-31: 1 via TRANSDERMAL
  Filled 2023-03-31: qty 1

## 2023-03-31 MED ORDER — LABETALOL HCL 200 MG PO TABS
200.0000 mg | ORAL_TABLET | Freq: Two times a day (BID) | ORAL | Status: DC
  Administered 2023-03-31 – 2023-04-01 (×3): 200 mg via ORAL
  Filled 2023-03-31 (×3): qty 1

## 2023-03-31 MED ORDER — CHLORHEXIDINE GLUCONATE CLOTH 2 % EX PADS
6.0000 | MEDICATED_PAD | Freq: Every day | CUTANEOUS | Status: DC
  Administered 2023-03-31 – 2023-04-01 (×2): 6 via TOPICAL

## 2023-03-31 MED ORDER — HYDRALAZINE HCL 50 MG PO TABS
50.0000 mg | ORAL_TABLET | Freq: Three times a day (TID) | ORAL | Status: DC
  Administered 2023-03-31 – 2023-04-01 (×4): 50 mg via ORAL
  Filled 2023-03-31 (×4): qty 1

## 2023-03-31 MED ORDER — ISOSORBIDE MONONITRATE ER 60 MG PO TB24
60.0000 mg | ORAL_TABLET | Freq: Every day | ORAL | Status: DC
  Administered 2023-04-01: 60 mg via ORAL
  Filled 2023-03-31: qty 1

## 2023-03-31 NOTE — Hospital Course (Signed)
[name] is a 67 y.o. male with a history of ESRD on hemodialysis, diabetes mellitus type 2, PAD, bilateral BKA, diabetic retinopathy, carotid artery stenosis status post endarterectomy, OSA, chronic back pain, paroxysmal atrial fibrillation on Eliquis.  Patient presented secondary to chest pain with concern for hypertensive emergency.  Cardiology consulted as well.  Patient started on nitroglycerin drip and antihypertensives adjusted.  Blood pressure.  Nephrology consulted for inpatient hemodialysis as patient may be volume overloaded secondary to missed hemodialysis.

## 2023-03-31 NOTE — Progress Notes (Signed)
PROGRESS NOTE    [name]  [name]:[id number] DOB: 07-Dec-1955 DOA: 03/30/2023 PCP: Lonie Peak, PA-C   Brief Narrative: [name] is a 67 y.o. male with a history of ESRD on hemodialysis, diabetes mellitus type 2, PAD, bilateral BKA, diabetic retinopathy, carotid artery stenosis status post endarterectomy, OSA, chronic back pain, paroxysmal atrial fibrillation on Eliquis.  Patient presented secondary to chest pain with concern for hypertensive emergency.  Cardiology consulted as well.  Patient started on nitroglycerin drip and antihypertensives adjusted.  Blood pressure.  Nephrology consulted for inpatient hemodialysis as patient may be volume overloaded secondary to missed hemodialysis.   Assessment and Plan:  Chest pain Concern for possible anginal symptoms. -Follow-up echocardiogram  Hypertensive emergency Present on admission.  Patient is on amlodipine and Imdur as an outpatient.  Possibly contributed to by missed dialysis as well.  Associated chest pain with concern for unstable angina.  Home antihypertensives resumed.  Nitroglycerin drip started. -Continue amlodipine and Imdur -Continue nitroglycerin IV per cardiology recommendations -Cardiology recommendations: start hydralazine and labetalol  ESRD on HD Patient was meant to have hemodialysis on 7/20 as an outpatient, which was deferred secondary to symptoms of chest pain. Nephrology consulted for inpatient hemodialysis needs.  Chronic diastolic heart failure Stable.  No evidence of acute heart failure.  PAD -Continue Lipitor and aspirin  Chronic atrial fibrillation Patient is on amiodarone and Eliquis as an outpatient.  Currently in sinus rhythm.  Eliquis held secondary to initiation of heparin drip. -Continue amiodarone -Restart Eliquis pending ongoing cardiology recommendations  GERD -Continue Protonix  Normocytic anemia Stable.  S/p right BKA Noted.  COPD Stable.  Chronic pain Patient is  prescribed fentanyl patch 50 mcg/h every 3 days and Lyrica.  OSA Not using CPAP per patient preference  Tobacco use Noted. Counseled on admission.  Prolonged QTc Associated bundle branch block affecting measurement.    DVT prophylaxis: Heparin IV Code Status:   Code Status: Full Code Family Communication: None at bedside Disposition Plan: Discharge pending continue cardiac workup/management   Consultants:  Cardiology Nephrology  Procedures:  None  Antimicrobials: None   Subjective: Patient reports continued chest pain at rest. No other issues. Convinced that he is not fluid overloaded.  Objective: BP (!) 178/64 (BP Location: Right Wrist)   Pulse 71   Temp 98.1 F (36.7 C) (Oral)   Resp 13   Wt 110.2 kg   SpO2 100%   BMI 31.19 kg/m   Examination:  General exam: Appears calm and comfortable Respiratory system: Clear to auscultation. Respiratory effort normal. Cardiovascular system: S1 & S2 heard, RRR. 1/6 systolic murmur Gastrointestinal system: Abdomen is nondistended, soft and nontender. Normal bowel sounds heard. Central nervous system: Alert and oriented. No focal neurological deficits. Musculoskeletal: Bilateral BKA. No reproducible chest pain Psychiatry: Judgement and insight appear normal. Mood & affect appropriate.    Data Reviewed: I have personally reviewed following labs and imaging studies  CBC Lab Results  Component Value Date   WBC 7.6 03/31/2023   RBC 3.18 (L) 03/31/2023   HGB 9.8 (L) 03/31/2023   HCT 29.2 (L) 03/31/2023   MCV 91.8 03/31/2023   MCH 30.8 03/31/2023   PLT 166 03/31/2023   MCHC 33.6 03/31/2023   RDW 13.1 03/31/2023   LYMPHSABS 1.2 11/19/2022   MONOABS 0.8 11/19/2022   EOSABS 0.2 11/19/2022   BASOSABS 0.0 11/19/2022     Last metabolic panel Lab Results  Component Value Date   NA 138 03/31/2023   K 3.9 03/31/2023  CL 95 (L) 03/31/2023   CO2 26 03/31/2023   BUN 46 (H) 03/31/2023   CREATININE 6.32 (H)  03/31/2023   GLUCOSE 106 (H) 03/31/2023   GFRNONAA 9 (L) 03/31/2023   GFRAA 16 (L) 05/30/2020   CALCIUM 8.6 (L) 03/31/2023   PHOS 5.8 (H) 03/31/2023   PROT 5.4 (L) 03/31/2023   ALBUMIN 2.7 (L) 03/31/2023   LABGLOB 2.6 05/30/2020   AGRATIO 1.4 05/30/2020   BILITOT 0.5 03/31/2023   ALKPHOS 108 03/31/2023   AST 21 03/31/2023   ALT 25 03/31/2023   ANIONGAP 17 (H) 03/31/2023    GFR: Estimated Creatinine Clearance: 14.8 mL/min (A) (by C-G formula based on SCr of 6.32 mg/dL (H)).  No results found for this or any previous visit (from the past 240 hour(s)).    Radiology Studies: DG Chest 2 View  Result Date: 03/30/2023 CLINICAL DATA:  Left-sided chest pain with shortness of breath for 3 days. Dialysis patient. EXAM: CHEST - 2 VIEW COMPARISON:  Radiographs 11/19/2022 and 06/23/2022. Chest CT 06/23/2022. FINDINGS: The heart size and mediastinal contours are stable. There is aortic atherosclerosis. Interval improved aeration of both lungs without evidence of focal airspace disease, edema, pleural effusion or pneumothorax. Probable mild chronic central airway thickening. Previous lower cervical fusion noted. No acute osseous findings are seen. IMPRESSION: No evidence of acute cardiopulmonary process. Interval improved aeration of both lungs. Electronically Signed   By: Carey Bullocks M.D.   On: 03/30/2023 14:16      LOS: 0 days    Harold Hawking, MD [facility] 03/31/2023, 7:41 AM   If 7PM-7AM, please contact night-coverage [email or web address]

## 2023-03-31 NOTE — Progress Notes (Signed)
ANTICOAGULATION CONSULT NOTE - Follow Up  Pharmacy Consult for Heparin Indication: chest pain/ACS  Allergies  Allergen Reactions   Contrast Media [Iodinated Contrast Media] Shortness Of Breath and Other (See Comments)    Difficulty breathing and altered mental status     Ivp Dye [Iodinated Contrast Media] Anaphylaxis, Shortness Of Breath and Other (See Comments)    Breathing problems, altered mental state    Adhesive [Tape] Rash and Other (See Comments)    Rash after 1 day of use Do not leave on longer then 3 days with out be changed   Latex Rash and Other (See Comments)    A severe rash appears after the first 24 hours of being placed    Patient Measurements: Weight: 110.2 kg (242 lb 15.2 oz) Heparin Dosing Weight: 105.9 kg  Vital Signs: Temp: 98 F (36.7 C) (07/20 2345) Temp Source: Oral (07/20 2345) BP: 164/59 (07/21 0215) Pulse Rate: 77 (07/20 2150)  Labs: Recent Labs    03/30/23 1344 03/30/23 1652 03/30/23 1901 03/30/23 1922 03/30/23 2307 03/31/23 0314  HGB 10.7*  --   --   --   --  9.8*  HCT 32.5*  --   --   --   --  29.2*  PLT 180  --   --   --   --  166  APTT  --   --  33  --   --  51*  HEPARINUNFRC  --   --  >1.10*  --   --  >1.10*  CREATININE 5.97*  --   --   --   --  6.32*  TROPONINIHS 24* 26*  --  30* 26*  --    Assessment: 66 yom with a history of CAD, ESRD on HD TuThSa, COPD, DM, chronic pain, GERD, HTN, HLD, neuromuscular disorder, PAD. Patient is presenting with chest pain. Heparin per pharmacy consult placed for chest pain/ACS.  Patient is on apixaban prior to arrival. Last dose 7/20am. Patient is stating that he takes this once daily he thinks, of note. Will require aPTT monitoring due to likely falsely high anti-Xa level secondary to DOAC use.  Hgb 10.7; plt 180  7/21 AM: aPTT 51 and heparin level >1.1. Given recent eliquis, will use aPTT which is subtherapeutic on 1400 units/hr. Per RN, no issues with the heparin infusion running  continuously. Hgb 10.7>9.8  Goal of Therapy:  Heparin level 0.3-0.7 units/ml aPTT 66-102 seconds Monitor platelets by anticoagulation protocol: Yes   Plan:  Increase heparin infusion to 1700 units/hr Check aPTT level in 8 hours and daily while on heparin Continue to monitor via aPTT until levels are correlated Continue to monitor H&H and platelets  Arabella Merles, PharmD. Clinical Pharmacist 03/31/2023 4:17 AM

## 2023-03-31 NOTE — Plan of Care (Signed)
Patient alert and oriented. Nitroglycerin gtt and heparin gtt stopped per order during this shift. Patient able to transition to oral antihypertensives. Educated on importance of adherence to Cardiac diet as family brought in [employer] fast food for patient. Emphasized salt intake due to dialysis and current blood pressure. Medicated as requested for pain with PRN regimen.   Problem: Coping: Goal: Ability to adjust to condition or change in health will improve Outcome: Progressing   Problem: Fluid Volume: Goal: Ability to maintain a balanced intake and output will improve Outcome: Progressing   Problem: Metabolic: Goal: Ability to maintain appropriate glucose levels will improve Outcome: Progressing   Problem: Nutritional: Goal: Maintenance of adequate nutrition will improve Outcome: Progressing Goal: Progress toward achieving an optimal weight will improve Outcome: Progressing   Problem: Clinical Measurements: Goal: Ability to maintain clinical measurements within normal limits will improve Outcome: Progressing Goal: Will remain free from infection Outcome: Progressing Goal: Diagnostic test results will improve Outcome: Progressing Goal: Respiratory complications will improve Outcome: Progressing Goal: Cardiovascular complication will be avoided Outcome: Progressing   Problem: Pain Managment: Goal: General experience of comfort will improve Outcome: Progressing

## 2023-03-31 NOTE — Progress Notes (Signed)
Lab called to report that patient is MRSA detected on his nasal swab. Initiated standing orders for MRSA positive screen. Dr. Caleb Popp notified via SecureChat of result.

## 2023-03-31 NOTE — Consult Note (Signed)
[facility] Renal Consultation Note    Indication for Consultation:  Management of ESRD/hemodialysis, anemia, hypertension/volume, and secondary hyperparathyroidism.  HPI: [name] is a 67 y.o. male with PMH including ESRD on dialysis, COPD, DM, CAD, PVD, and HTN who presented to the ED yesterday with chest pain.  Patient reports he began having chest pain about 3 days ago. Initially, it improved with nitroglycerin but yesterday he took nitroglycerin before HD with no improvement. Due to his ongoing CP, his dialysis center sent him to the ED for evaluation. He does ave a history of unstable angina. CXR without overt edema. BP significantly elevated. He reports his BP has been high consistently lately but does drop with dialysis. Troponins were flat. He was seen by cardiology who suspected demand ischemia due to hypertensive crisis and recommended IV heparin and nitroglycerin. Nephrology was consulted for management of ESRD. Outpatient notes reviewed, BP has been consistently high and he has been missing Tuesday dialysis treatments for the past 3 weeks.  Patient reports he continues to have chest pain today, unchanged since yesterday. He reports his appetite and energy level are low/stable. He denies SOB, orthopnea, PND, edema, abdominal pain, N/V/D. No recent fevers/chills. Denies urinary changes including dysuria, hematuria, fever, flank pain.   Past Medical History:  Diagnosis Date   Carotid artery occlusion 11/10/10   LEFT CAROTID ENDARTERECTOMY   Chronic kidney disease    Complication of anesthesia    BP WENT UP AT [facility] "   COPD (chronic obstructive pulmonary disease) (HCC)    pt denies this dx as of 06/01/20 - no inhaler    Diabetes mellitus without complication (HCC)    Diverticulitis    Diverticulosis of colon (without mention of hemorrhage)    DJD (degenerative joint disease)    knees/hands/feet/back/neck   Fatty liver    Full dentures    GERD (gastroesophageal  reflux disease)    H/O hiatal hernia    History of blood transfusion    with a past surical procedure per patient 06/01/20   Hyperlipidemia    Hypertension    Neuromuscular disorder (HCC)    peripheral neuropathy   Non-pressure chronic ulcer of other part of left foot limited to breakdown of skin (HCC) 11/12/2016   Osteomyelitis (HCC)    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 Atherectomy left popliteal artery July 2012.    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   Unstable angina (HCC) 09/16/2018   Wears glasses    Past Surgical History:  Procedure Laterality Date   ABDOMINAL AORTOGRAM W/LOWER EXTREMITY N/A 06/23/2021   Procedure: ABDOMINAL AORTOGRAM W/LOWER EXTREMITY;  Surgeon: Elder Negus, MD;  [facility];  Service: Cardiovascular;  Laterality: N/A;   AMPUTATION  11/05/2011   Procedure: AMPUTATION RAY;  Surgeon: Toni Arthurs, MD;  Location: [facility] OR;  Service: Orthopedics;  Laterality: Right;  Amputation of Right 4&5th Toes   AMPUTATION Left 11/26/2012   Procedure: AMPUTATION RAY;  Surgeon: Toni Arthurs, MD;  Location: [facility] OR;  Service: Orthopedics;  Laterality: Left;  fourth ray amputation   AMPUTATION Right 08/27/2014   Procedure: Transmetatarsal Amputation;  Surgeon: Nadara Mustard, MD;  Location: [facility] OR;  Service: Orthopedics;  Laterality: Right;   AMPUTATION Right 01/14/2015  Procedure: AMPUTATION BELOW KNEE;  Surgeon: Nadara Mustard, MD;  Location: [facility] OR;  Service: Orthopedics;  Laterality: Right;   AMPUTATION Left 10/21/2015   Procedure: Left Foot 5th Ray Amputation;  Surgeon: Nadara Mustard, MD;  Location: [facility] OR;  Service: Orthopedics;  Laterality: Left;   AMPUTATION Left 07/04/2022   Procedure: LEFT BELOW KNEE AMPUTATION;  Surgeon: Nadara Mustard, MD;  Location: [facility] OR;  Service: Orthopedics;   Laterality: Left;   ANTERIOR FUSION CERVICAL SPINE  02/06/06   C4-5, C5-6, C6-7; SURGEON DR. MAX COHEN   AV FISTULA PLACEMENT Left 06/02/2020   Procedure: ARTERIOVENOUS (AV) FISTULA CREATION LEFT;  Surgeon: Maeola Harman, MD;  Location: [facility] OR;  Service: Vascular;  Laterality: Left;   BACK SURGERY     x 3   BASCILIC VEIN TRANSPOSITION Left 07/21/2020   Procedure: LEFT UPPER ARM ATERIOVENOUS SUPERFISTULALIZATION;  Surgeon: Maeola Harman, MD;  Location: [facility] OR;  Service: Vascular;  Laterality: Left;   BELOW KNEE LEG AMPUTATION Right    CARDIAC CATHETERIZATION  10/31/04   2009   CAROTID ENDARTERECTOMY  11/10/10   CAROTID ENDARTERECTOMY Left 11/10/2010   Subtotal occlusion of left internal carotid artery with left hemispheric transient ischemic attacks.   CAROTID STENT     CARPAL TUNNEL RELEASE Right 10/21/2013   Procedure: RIGHT CARPAL TUNNEL RELEASE;  Surgeon: Nicki Reaper, MD;  Location: [facility];  Service: Orthopedics;  Laterality: Right;   CHOLECYSTECTOMY     COLON SURGERY     COLONOSCOPY     COLOSTOMY REVERSAL  05/21/2018   ileostomy reversal   CYSTOSCOPY WITH STENT PLACEMENT Bilateral 01/13/2018   Procedure: CYSTOSCOPY WITH BILATERAL URETERAL CATHETER PLACEMENT;  Surgeon: Crist Fat, MD;  Location: [facility];  Service: Urology;  Laterality: Bilateral;   ESOPHAGEAL MANOMETRY Bilateral 07/19/2014   Procedure: ESOPHAGEAL MANOMETRY (EM);  Surgeon: Beverley Fiedler, MD;  Location: [facility];  Service: Gastroenterology;  Laterality: Bilateral;   EYE SURGERY Bilateral 2020   cataract   FEMORAL ARTERY STENT     x6   FINGER SURGERY     FOOT SURGERY  04/25/2016    EXCISION BASE 5TH METATARSAL AND PARTIAL CUBOID LEFT FOOT   HERNIA REPAIR     LEFT INGUINAL AND UMBILICAL REPAIRS   HERNIA REPAIR     I & D EXTREMITY Left 04/25/2016   Procedure: EXCISION BASE 5TH METATARSAL AND PARTIAL CUBOID LEFT FOOT;  Surgeon: Nadara Mustard, MD;  [facility];  Service:  Orthopedics;  Laterality: Left;   ILEOSTOMY  01/13/2018   Procedure: ILEOSTOMY;  Surgeon: Berna Bue, MD;  Location: [facility];  Service: General;;   ILEOSTOMY CLOSURE N/A 05/21/2018   Procedure: ILEOSTOMY REVERSAL ERAS PATHWAY;  Surgeon: Berna Bue, MD;  Location: [facility];  Service: General;  Laterality: N/A;   IR RADIOLOGIST EVAL & MGMT  11/19/2017   IR RADIOLOGIST EVAL & MGMT  12/03/2017   IR RADIOLOGIST EVAL & MGMT  12/18/2017   JOINT REPLACEMENT Right 2001   Total knee   LAMINECTOMY     X 3 LUMBAR AND X 2 CERVICAL SPINE OPERATIONS   LAPAROSCOPIC CHOLECYSTECTOMY W/ CHOLANGIOGRAPHY  11/09/04   SURGEON DR. Caleen Essex   LEFT HEART CATH AND CORONARY ANGIOGRAPHY N/A 09/16/2018   Procedure: LEFT HEART CATH AND CORONARY ANGIOGRAPHY;  Surgeon: Elder Negus, MD;  [facility];  Service: Cardiovascular;  Laterality: N/A;   LEFT HEART CATHETERIZATION WITH CORONARY ANGIOGRAM N/A  10/29/2014   Procedure: LEFT HEART CATHETERIZATION WITH CORONARY ANGIOGRAM;  Surgeon: Pamella Pert, MD;  Location: [facility] CATH LAB;  Service: Cardiovascular;  Laterality: N/A;   LIGATION OF COMPETING BRANCHES OF ARTERIOVENOUS FISTULA Left 07/21/2020   Procedure: LIGATION OF COMPETING BRANCHES OF LEFT UPPER ARM ARTERIOVENOUS FISTULA;  Surgeon: Maeola Harman, MD;  Location: [facility] OR;  Service: Vascular;  Laterality: Left;   LOWER EXTREMITY ANGIOGRAM N/A 03/19/2012   Procedure: LOWER EXTREMITY ANGIOGRAM;  Surgeon: Kathleene Hazel, MD;  Location: [facility] CATH LAB;  Service: Cardiovascular;  Laterality: N/A;   LOWER EXTREMITY ANGIOGRAPHY N/A 06/20/2021   Procedure: LOWER EXTREMITY ANGIOGRAPHY;  Surgeon: Elder Negus, MD;  [facility];  Service: Cardiovascular;  Laterality: N/A;   NECK SURGERY     PARTIAL COLECTOMY N/A 01/13/2018   Procedure: LAPAROSCOPIC ASSISTED   SIGMOID COLECTOMY ILEOSTOMY;  Surgeon: Berna Bue, MD;  Location: [facility];  Service: General;   Laterality: N/A;   PENILE PROSTHESIS IMPLANT  08/14/05   INFRAPUBIC INSERTION OF INFLATABLE PENILE PROSTHESIS; SURGEON DR. Logan Bores   PENILE PROSTHESIS IMPLANT     PERCUTANEOUS CORONARY STENT INTERVENTION (PCI-S) Right 10/29/2014   Procedure: PERCUTANEOUS CORONARY STENT INTERVENTION (PCI-S);  Surgeon: Pamella Pert, MD;  Location: [facility] CATH LAB;  Service: Cardiovascular;  Laterality: Right;   PERIPHERAL VASCULAR INTERVENTION Left 06/23/2021   Procedure: PERIPHERAL VASCULAR INTERVENTION;  Surgeon: Elder Negus, MD;  [facility];  Service: Cardiovascular;  Laterality: Left;   REMOVAL OF PENILE PROSTHESIS N/A 06/14/2021   Procedure: Removal of THREE piece inflatable penile prosthesis;  Surgeon: Crista Elliot, MD;  Location: [facility] OR;  Service: Urology;  Laterality: N/A;   SHOULDER ARTHROSCOPY     SPINE SURGERY     TOE AMPUTATION Left    TONSILLECTOMY     TOTAL KNEE ARTHROPLASTY  07/2002   RIGHT KNEE ; SURGEON  DR. [name] ALSO HAD ARTHROSCOPIC RIGHT KNEE IN  10/2001   TOTAL KNEE ARTHROPLASTY     ULNAR NERVE TRANSPOSITION Right 10/21/2013   Procedure: RIGHT ELBOW  ULNAR NERVE DECOMPRESSION;  Surgeon: Nicki Reaper, MD;  Location: [facility];  Service: Orthopedics;  Laterality: Right;   Family History  Problem Relation Age of Onset   Heart disease Father        Before age 49-  CAD, BPG   Diabetes Father        Amputation   Cancer Father        PROSTATE   Hyperlipidemia Father    Hypertension Father    Heart attack Father        Triple BPG   Varicose Veins Father    Cancer Sister        Breast   Hyperlipidemia Sister    Hypertension Sister    Heart attack Brother    Colon cancer Brother    Diabetes Brother    Heart disease Brother 56       A-Fib. Before age 85   Hyperlipidemia Brother    Hypertension Brother    Hypertension Son    Arthritis Other        GRANDMOTHER   Hypertension Other        OTHER FAMILY MEMBERS   Social History:   reports that he has been smoking cigarettes. He has a 17.5 pack-year smoking history. He has never used smokeless tobacco. He reports that he does not currently use alcohol. He reports that he does not use  drugs.  ROS: As per HPI otherwise negative.  Physical Exam: Vitals:   03/31/23 0745 03/31/23 0815 03/31/23 0825 03/31/23 0830  BP: (!) 168/53 (!) 172/69 (!) 182/59 (!) 197/106  Pulse:   69   Resp: 14 14 15 14   Temp:      TempSrc:      SpO2:      Weight:         General: Alert male, in no acute distress. Head: Normocephalic, atraumatic, sclera non-icteric, mucus membranes are moist. Neck: JVD not elevated. Lungs: Clear bilaterally to auscultation without wheezes, rales, or rhonchi. Breathing is unlabored. Heart: RRR with normal S1, S2. No murmurs, rubs, or gallops appreciated. Abdomen: Soft, non-distended with normoactive bowel sounds.  Musculoskeletal:  Strength and tone appear normal for age. Lower extremities: B/l BKA, no edema Neuro: Alert and oriented X 3. Moves all extremities spontaneously. Psych:  Responds to questions appropriately with a normal affect. Dialysis Access: AVF + thrill/bruit  Allergies  Allergen Reactions   Contrast Media [Iodinated Contrast Media] Shortness Of Breath and Other (See Comments)    Difficulty breathing and altered mental status     Ivp Dye [Iodinated Contrast Media] Anaphylaxis, Shortness Of Breath and Other (See Comments)    Breathing problems, altered mental state    Adhesive [Tape] Rash and Other (See Comments)    Rash after 1 day of use Do not leave on longer then 3 days with out be changed   Latex Rash and Other (See Comments)    A severe rash appears after the first 24 hours of being placed   Prior to Admission medications   Medication Sig Start Date End Date Taking? Authorizing Provider  amiodarone (PACERONE) 200 MG tablet Take 1 tablet (200 mg total) by mouth daily. Patient taking differently: Take 100 mg by mouth daily.  10/29/22   Yates Decamp, MD  amLODipine (NORVASC) 10 MG tablet Take 1 tablet (10 mg total) by mouth daily. 01/21/23   Yates Decamp, MD  apixaban (ELIQUIS) 5 MG TABS tablet Take 1 tablet (5 mg total) by mouth 2 (two) times daily. 01/14/22 03/27/23  Almon Hercules, MD  atorvastatin (LIPITOR) 40 MG tablet Take 40 mg by mouth at bedtime.     [provider]  B Complex-C-Zn-Folic Acid (DIALYVITE/ZINC) TABS Take 1 tablet by mouth Every Tuesday,Thursday,and Saturday with dialysis. 10/04/21   [provider]  famotidine (PEPCID) 20 MG tablet Take 20 mg by mouth every other day.    [provider]  fentaNYL (DURAGESIC) 50 MCG/HR Place 1 patch onto the skin every 3 (three) days. 11/23/22   Dorcas Carrow, MD  folic acid (FOLVITE) 1 MG tablet Take 1 mg by mouth daily. 09/18/21   [provider]  Glucosamine HCl (GLUCOSAMINE PO) Take 500 mg by mouth 2 (two) times daily.    [provider]  insulin aspart (NOVOLOG FLEXPEN) 100 UNIT/ML FlexPen Inject 0-12 Units into the skin See admin instructions. Inject as per sliding scale: If 0 - 69 = 0 initiate hypoglycemia orders;  70 - 150 = 0;  151 - 200 = 2;  201 - 250 = 4;  251 - 300 = 6;  301 - 350 = 8;  351 - 400 = 10;  401 - 450 = 12.  If >400, give 12 units, recheck in 1 hour. If still >400, call MD/NP for further orders. Subcutaneously with meals in addition to scheduled 4 unites with meals.    [provider]  insulin glargine (  LANTUS) 100 UNIT/ML injection Inject 0.2 mLs (20 Units total) into the skin at bedtime. 06/28/22   Zigmund Daniel., MD  isosorbide mononitrate (IMDUR) 60 MG 24 hr tablet Take 1 tablet (60 mg total) by mouth daily. 01/21/23 04/21/23  Yates Decamp, MD  lidocaine-prilocaine (EMLA) cream Apply 1 application topically See admin instructions. Prior to Dialysis days Tuesday,Thursday and saturday 06/06/21   [provider]  loratadine (CLARITIN) 10 MG tablet Take 10 mg by mouth daily.     [provider]  metoCLOPramide (REGLAN) 5 MG tablet Take 1 tablet (5 mg total) by mouth 4 (four) times daily -  before meals and at bedtime. Patient taking differently: Take 5 mg by mouth in the morning, at noon, in the evening, and at bedtime. 01/14/22 03/27/23  Almon Hercules, MD  NEEDLE, REUSABLE, 22 G 22G X 1-1/2" MISC 1 Units by Does not apply route as directed. For B12 IM inj 02/15/18   Elgergawy, Leana Roe, MD  nitroGLYCERIN (NITROSTAT) 0.4 MG SL tablet Place 0.4 mg under the tongue every 5 (five) minutes as needed for chest pain. 01/06/22   [provider]  omega-3 acid ethyl esters (LOVAZA) 1 g capsule Take 2 g by mouth 2 (two) times daily.    [provider]  pantoprazole (PROTONIX) 40 MG tablet Take 1 tablet (40 mg total) by mouth 2 (two) times daily for 30 days, THEN 1 tablet (40 mg total) daily. Patient taking differently:  1 tablet (40 mg total) twice daily. 01/14/22 03/27/23  Almon Hercules, MD  Polyvinyl Alcohol-Povidone PF 1.4-0.6 % SOLN Place 1 drop into both eyes 2 (two) times daily as needed (dry eyes).     [provider]  pregabalin (LYRICA) 100 MG capsule Take 1 capsule (100 mg total) by mouth 2 (two) times daily for 5 days. 11/23/22 03/27/23  Dorcas Carrow, MD  rOPINIRole (REQUIP) 2 MG tablet Take 2 mg by mouth at bedtime.    [provider]  senna-docusate (SENOKOT-S) 8.6-50 MG tablet Take 2 tablets by mouth at bedtime.    [provider]  Syringe/Needle, Disp, (SYRINGE 3CC/22GX1-1/2") 22G X 1-1/2" 3 ML MISC 1 Syringe by Does not apply route as directed. For b12 IM inj 02/15/18   Elgergawy, Leana Roe, MD  tamsulosin (FLOMAX) 0.4 MG CAPS capsule Take 0.4 mg by mouth daily.    [provider]   Current Facility-Administered Medications  Medication Dose Route Frequency Provider Last Rate Last Admin   0.9 %  sodium chloride infusion  250 mL Intravenous PRN Doutova, Anastassia, MD       acetaminophen (TYLENOL) tablet 650 mg  650 mg  Oral Q6H PRN Doutova, Anastassia, MD       Or   acetaminophen (TYLENOL) suppository 650 mg  650 mg Rectal Q6H PRN Doutova, Anastassia, MD       amiodarone (PACERONE) tablet 100 mg  100 mg Oral Daily Doutova, Anastassia, MD       amLODipine (NORVASC) tablet 10 mg  10 mg Oral Daily Doutova, Anastassia, MD       aspirin EC tablet 81 mg  81 mg Oral Daily Doutova, Anastassia, MD       aspirin EC tablet 81 mg  81 mg Oral Daily Doutova, Anastassia, MD   81 mg at 03/30/23 2321   atorvastatin (LIPITOR) tablet 40 mg  40 mg Oral QHS Doutova, Anastassia, MD   40 mg at 03/30/23 2321   famotidine (PEPCID) tablet 20 mg  20  mg Oral Rogene Houston, MD       folic acid (FOLVITE) tablet 1 mg  1 mg Oral Daily Doutova, Anastassia, MD       heparin ADULT infusion 100 units/mL (25000 units/220mL)  1,700 Units/hr Intravenous Continuous Arabella Merles, RPH 17 mL/hr at 03/31/23 0629 1,700 Units/hr at 03/31/23 2956   HYDROcodone-acetaminophen (NORCO/VICODIN) 5-325 MG per tablet 1-2 tablet  1-2 tablet Oral Q4H PRN Therisa Doyne, MD       insulin aspart (novoLOG) injection 0-6 Units  0-6 Units Subcutaneous Q4H Doutova, Anastassia, MD       insulin glargine-yfgn (SEMGLEE) injection 10 Units  10 Units Subcutaneous QHS Therisa Doyne, MD   10 Units at 03/30/23 2321   isosorbide mononitrate (IMDUR) 24 hr tablet 60 mg  60 mg Oral Daily Doutova, Anastassia, MD       loratadine (CLARITIN) tablet 10 mg  10 mg Oral Daily Doutova, Anastassia, MD       metoCLOPramide (REGLAN) tablet 5 mg  5 mg Oral Q6H PRN Doutova, Anastassia, MD       nitroGLYCERIN 50 mg in dextrose 5 % 250 mL (0.2 mg/mL) infusion  0-200 mcg/min Intravenous Titrated Therisa Doyne, MD 12 mL/hr at 03/31/23 0826 40 mcg/min at 03/31/23 0826   oxyCODONE-acetaminophen (PERCOCET/ROXICET) 5-325 MG per tablet 1-2 tablet  1-2 tablet Oral Q6H PRN Therisa Doyne, MD   1 tablet at 03/31/23 0819   pantoprazole (PROTONIX) EC tablet 40 mg  40 mg Oral  Daily Doutova, Anastassia, MD       pregabalin (LYRICA) capsule 100 mg  100 mg Oral BID Therisa Doyne, MD   100 mg at 03/30/23 2321   rOPINIRole (REQUIP) tablet 2 mg  2 mg Oral QHS Doutova, Anastassia, MD   2 mg at 03/30/23 2321   sodium chloride flush (NS) 0.9 % injection 3 mL  3 mL Intravenous Q12H Doutova, Anastassia, MD       sodium chloride flush (NS) 0.9 % injection 3 mL  3 mL Intravenous PRN Doutova, Anastassia, MD       tamsulosin (FLOMAX) capsule 0.4 mg  0.4 mg Oral Daily Therisa Doyne, MD       Labs: Basic Metabolic Panel: Recent Labs  Lab 03/30/23 1344 03/31/23 0314  NA 135 138  K 4.3 3.9  CL 97* 95*  CO2 24 26  GLUCOSE 171* 106*  BUN 38* 46*  CREATININE 5.97* 6.32*  CALCIUM 8.4* 8.6*  PHOS  --  5.8*   Liver Function Tests: Recent Labs  Lab 03/30/23 1922 03/31/23 0314  AST 24 21  ALT 27 25  ALKPHOS 119 108  BILITOT 0.4 0.5  PROT 6.0* 5.4*  ALBUMIN 3.0* 2.7*   No results for input(s): "LIPASE", "AMYLASE" in the last 168 hours. No results for input(s): "AMMONIA" in the last 168 hours. CBC: Recent Labs  Lab 03/30/23 1344 03/31/23 0314  WBC 8.7 7.6  HGB 10.7* 9.8*  HCT 32.5* 29.2*  MCV 92.3 91.8  PLT 180 166   Cardiac Enzymes: No results for input(s): "CKTOTAL", "CKMB", "CKMBINDEX", "TROPONINI" in the last 168 hours. CBG: Recent Labs  Lab 03/30/23 2009 03/31/23 0005 03/31/23 0414  GLUCAP 150* 123* 107*   Iron Studies:  Recent Labs    03/30/23 1922  IRON 81  TIBC 224*  FERRITIN 939*   Studies/Results: DG Chest 2 View  Result Date: 03/30/2023 CLINICAL DATA:  Left-sided chest pain with shortness of breath for 3 days. Dialysis patient. EXAM: CHEST - 2 VIEW COMPARISON:  Radiographs 11/19/2022 and 06/23/2022. Chest CT 06/23/2022. FINDINGS: The heart size and mediastinal contours are stable. There is aortic atherosclerosis. Interval improved aeration of both lungs without evidence of focal airspace disease, edema, pleural effusion or  pneumothorax. Probable mild chronic central airway thickening. Previous lower cervical fusion noted. No acute osseous findings are seen. IMPRESSION: No evidence of acute cardiopulmonary process. Interval improved aeration of both lungs. Electronically Signed   By: Carey Bullocks M.D.   On: 03/30/2023 14:16    Dialysis Orders:  Center: [facility]  on TTS. 180NRe time: 4 hr BFR 450 DFR 800 EDW 11.3kg 2K 2Ca AVF 15g no heparin Last post HD weight 112.2kg Has been missing Tuesdays for the past 3 weeks Hectorol 1 mcg IV q HD  BP meds: amlodipine 10mg  daily, isosorbide mononitrate ER 60mg  daily, midodrine 10mg  pre HD (not taking lately)  Phos binder: Renvela 2 tabs per meal  Assessment/Plan:  Chest pain: Cardiology consulting. On IV heparin and nitroglycerin  ESRD:  Dialysis on TTS schedule, has been missing Tuesday treatments. Encourage compliance with three times per week HD. Will plan for HD this evening then resume TTS schedule.   Hypertension/volume: BP significantly elevated. Reports compliance with outpatient meds but does not take them prior to HD. Suspect he is volume overloaded as well due to missed HD (only attended 2 times per week lately per records and missed Saturday). May need lower EDW.  Anemia: Hgb 10.7. No ESA indicated at this time  Metabolic bone disease: Calcium controlled, phos elevated. Continue VDRA and phosphorus binders  Nutrition:  Alb low, recommend renal diet and protein supplements A.fib: rate currently controlled, on amiodarone and eliquis  Rogers Blocker, PA-C 03/31/2023, 8:57 AM  [facility] Pager: [phone]

## 2023-03-31 NOTE — Consult Note (Addendum)
CARDIOLOGY CONSULT NOTE  Patient ID: [name] MRN: [id number] DOB/AGE: Oct 18, 1955 67 y.o.  Admit date: 03/30/2023 Attending physician: [name], MD Primary Physician:  Lonie Peak, PA-C Outpatient Cardiology Provider: Dr. Yates Decamp  Inpatient Cardiologist: Tessa Lerner, DO, [facility]  Reason of consultation: Chest Pain  Referring physician: Peggyann Shoals PA-C    Chief complaint: chest pain   HPI:  [name] is a 67 y.o. Caucasian male who presents with a chief complaint of "chest pain." His past medical history and cardiovascular risk factors include: Severe peripheral artery disease with prior PVI, status post bilateral BKA, uncontrolled diabetes, end-stage renal disease, cardiomyopathy, coronary disease status history of coronary interventions, right carotid endarterectomy in 2012, OSA unable to tolerate CPAP, chronic back and neck pain, paroxysmal atrial fibrillation.  Presented to the ED yesterday for chest pain.  Cardiology was asked to further evaluate his precordial discomfort.  Chest pain is located over the left anterior wall, ache-like sensation, intensity 6 out of 10, nonradiating, duration 30-40 minutes, unable to quantify pain with exertion as he is currently not wearing his prosthesis given his bilateral BKA.  No significant change in discomfort since his last office with Dr. Jacinto Halim on 03/27/2023.  Blood pressures are not well-controlled with SBP ranging between 180-200 mmHg.  Patient states that he only gets his blood pressure medications at SNF on days he is not on dialysis.  He does not get any antihypertensive medications postdialysis based on blood pressure readings.  ALLERGIES: Allergies  Allergen Reactions   Contrast Media [Iodinated Contrast Media] Shortness Of Breath and Other (See Comments)    Difficulty breathing and altered mental status     Ivp Dye [Iodinated Contrast Media] Anaphylaxis, Shortness Of Breath and Other (See Comments)    Breathing  problems, altered mental state    Adhesive [Tape] Rash and Other (See Comments)    Rash after 1 day of use Do not leave on longer then 3 days with out be changed   Latex Rash and Other (See Comments)    A severe rash appears after the first 24 hours of being placed    PAST MEDICAL HISTORY: Past Medical History:  Diagnosis Date   Carotid artery occlusion 11/10/10   LEFT CAROTID ENDARTERECTOMY   Chronic kidney disease    Complication of anesthesia    BP WENT UP AT [facility] "   COPD (chronic obstructive pulmonary disease) (HCC)    pt denies this dx as of 06/01/20 - no inhaler    Diabetes mellitus without complication (HCC)    Diverticulitis    Diverticulosis of colon (without mention of hemorrhage)    DJD (degenerative joint disease)    knees/hands/feet/back/neck   Fatty liver    Full dentures    GERD (gastroesophageal reflux disease)    H/O hiatal hernia    History of blood transfusion    with a past surical procedure per patient 06/01/20   Hyperlipidemia    Hypertension    Neuromuscular disorder (HCC)    peripheral neuropathy   Non-pressure chronic ulcer of other part of left foot limited to breakdown of skin (HCC) 11/12/2016   Osteomyelitis (HCC)    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 Atherectomy left popliteal artery July 2012.    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   Unstable angina (HCC) 09/16/2018   Wears glasses     PAST SURGICAL HISTORY: Past Surgical History:  Procedure Laterality Date   ABDOMINAL AORTOGRAM W/LOWER EXTREMITY N/A 06/23/2021   Procedure: ABDOMINAL AORTOGRAM W/LOWER EXTREMITY;  Surgeon: Elder Negus, MD;  [facility];  Service: Cardiovascular;  Laterality: N/A;   AMPUTATION  11/05/2011   Procedure: AMPUTATION RAY;  Surgeon: Toni Arthurs, MD;  Location: [facility]  OR;  Service: Orthopedics;  Laterality: Right;  Amputation of Right 4&5th Toes   AMPUTATION Left 11/26/2012   Procedure: AMPUTATION RAY;  Surgeon: Toni Arthurs, MD;  Location: [facility] OR;  Service: Orthopedics;  Laterality: Left;  fourth ray amputation   AMPUTATION Right 08/27/2014   Procedure: Transmetatarsal Amputation;  Surgeon: Nadara Mustard, MD;  Location: [facility] OR;  Service: Orthopedics;  Laterality: Right;   AMPUTATION Right 01/14/2015   Procedure: AMPUTATION BELOW KNEE;  Surgeon: Nadara Mustard, MD;  [facility]: [facility] OR;  Service: Orthopedics;  Laterality: Right;   AMPUTATION Left 10/21/2015   Procedure: Left Foot 5th Ray Amputation;  Surgeon: Nadara Mustard, MD;  Location: [facility] OR;  Service: Orthopedics;  Laterality: Left;   AMPUTATION Left 07/04/2022   Procedure: LEFT BELOW KNEE AMPUTATION;  Surgeon: Nadara Mustard, MD;  Location: [facility] OR;  Service: Orthopedics;  Laterality: Left;   ANTERIOR FUSION CERVICAL SPINE  02/06/06   C4-5, C5-6, C6-7; SURGEON DR. MAX COHEN   AV FISTULA PLACEMENT Left 06/02/2020   Procedure: ARTERIOVENOUS (AV) FISTULA CREATION LEFT;  Surgeon: Maeola Harman, MD;  Location: [facility] OR;  Service: Vascular;  Laterality: Left;   BACK SURGERY     x 3   BASCILIC VEIN TRANSPOSITION Left 07/21/2020   Procedure: LEFT UPPER ARM ATERIOVENOUS SUPERFISTULALIZATION;  Surgeon: Maeola Harman, MD;  Location: [facility] OR;  Service: Vascular;  Laterality: Left;   BELOW KNEE LEG AMPUTATION Right    CARDIAC CATHETERIZATION  10/31/04   2009   CAROTID ENDARTERECTOMY  11/10/10   CAROTID ENDARTERECTOMY Left 11/10/2010   Subtotal occlusion of left internal carotid artery with left hemispheric transient ischemic attacks.   CAROTID STENT     CARPAL TUNNEL RELEASE Right 10/21/2013   Procedure: RIGHT CARPAL TUNNEL RELEASE;  Surgeon: Nicki Reaper, MD;  Location: [facility];  Service: Orthopedics;  Laterality: Right;   CHOLECYSTECTOMY     COLON SURGERY     COLONOSCOPY     COLOSTOMY  REVERSAL  05/21/2018   ileostomy reversal   CYSTOSCOPY WITH STENT PLACEMENT Bilateral 01/13/2018   Procedure: CYSTOSCOPY WITH BILATERAL URETERAL CATHETER PLACEMENT;  Surgeon: Crist Fat, MD;  Location: [facility] ORS;  Service: Urology;  Laterality: Bilateral;   ESOPHAGEAL MANOMETRY Bilateral 07/19/2014   Procedure: ESOPHAGEAL MANOMETRY (EM);  Surgeon: Beverley Fiedler, MD;  Location: [facility];  Service: Gastroenterology;  Laterality: Bilateral;   EYE SURGERY Bilateral 2020   cataract   FEMORAL ARTERY STENT     x6   FINGER SURGERY     FOOT SURGERY  04/25/2016    EXCISION BASE 5TH METATARSAL AND PARTIAL CUBOID LEFT FOOT   HERNIA REPAIR     LEFT INGUINAL AND UMBILICAL REPAIRS   HERNIA REPAIR     I & D EXTREMITY Left 04/25/2016   Procedure: EXCISION BASE 5TH METATARSAL AND PARTIAL CUBOID LEFT FOOT;  Surgeon: Nadara Mustard, MD;  [facility];  Service: Orthopedics;  Laterality: Left;   ILEOSTOMY  01/13/2018   Procedure: ILEOSTOMY;  Surgeon: Fredricka Bonine,  Lady Deutscher, MD;  Location: [facility];  Service: General;;   ILEOSTOMY CLOSURE N/A 05/21/2018   Procedure: ILEOSTOMY REVERSAL ERAS PATHWAY;  Surgeon: Berna Bue, MD;  Location: [facility];  Service: General;  Laterality: N/A;   IR RADIOLOGIST EVAL & MGMT  11/19/2017   IR RADIOLOGIST EVAL & MGMT  12/03/2017   IR RADIOLOGIST EVAL & MGMT  12/18/2017   JOINT REPLACEMENT Right 2001   Total knee   LAMINECTOMY     X 3 LUMBAR AND X 2 CERVICAL SPINE OPERATIONS   LAPAROSCOPIC CHOLECYSTECTOMY W/ CHOLANGIOGRAPHY  11/09/04   SURGEON DR. Caleen Essex   LEFT HEART CATH AND CORONARY ANGIOGRAPHY N/A 09/16/2018   Procedure: LEFT HEART CATH AND CORONARY ANGIOGRAPHY;  Surgeon: Elder Negus, MD;  [facility];  Service: Cardiovascular;  Laterality: N/A;   LEFT HEART CATHETERIZATION WITH CORONARY ANGIOGRAM N/A 10/29/2014   Procedure: LEFT HEART CATHETERIZATION WITH CORONARY ANGIOGRAM;  Surgeon: Pamella Pert, MD;  Location: [facility] CATH LAB;  Service:  Cardiovascular;  Laterality: N/A;   LIGATION OF COMPETING BRANCHES OF ARTERIOVENOUS FISTULA Left 07/21/2020   Procedure: LIGATION OF COMPETING BRANCHES OF LEFT UPPER ARM ARTERIOVENOUS FISTULA;  Surgeon: Maeola Harman, MD;  Location: [facility] OR;  Service: Vascular;  Laterality: Left;   LOWER EXTREMITY ANGIOGRAM N/A 03/19/2012   Procedure: LOWER EXTREMITY ANGIOGRAM;  Surgeon: Kathleene Hazel, MD;  Location: [facility] CATH LAB;  Service: Cardiovascular;  Laterality: N/A;   LOWER EXTREMITY ANGIOGRAPHY N/A 06/20/2021   Procedure: LOWER EXTREMITY ANGIOGRAPHY;  Surgeon: Elder Negus, MD;  [facility];  Service: Cardiovascular;  Laterality: N/A;   NECK SURGERY     PARTIAL COLECTOMY N/A 01/13/2018   Procedure: LAPAROSCOPIC ASSISTED   SIGMOID COLECTOMY ILEOSTOMY;  Surgeon: Berna Bue, MD;  Location: [facility];  Service: General;  Laterality: N/A;   PENILE PROSTHESIS IMPLANT  08/14/05   INFRAPUBIC INSERTION OF INFLATABLE PENILE PROSTHESIS; SURGEON DR. Logan Bores   PENILE PROSTHESIS IMPLANT     PERCUTANEOUS CORONARY STENT INTERVENTION (PCI-S) Right 10/29/2014   Procedure: PERCUTANEOUS CORONARY STENT INTERVENTION (PCI-S);  Surgeon: Pamella Pert, MD;  Location: [facility] CATH LAB;  Service: Cardiovascular;  Laterality: Right;   PERIPHERAL VASCULAR INTERVENTION Left 06/23/2021   Procedure: PERIPHERAL VASCULAR INTERVENTION;  Surgeon: Elder Negus, MD;  [facility];  Service: Cardiovascular;  Laterality: Left;   REMOVAL OF PENILE PROSTHESIS N/A 06/14/2021   Procedure: Removal of THREE piece inflatable penile prosthesis;  Surgeon: Crista Elliot, MD;  Location: [facility] OR;  Service: Urology;  Laterality: N/A;   SHOULDER ARTHROSCOPY     SPINE SURGERY     TOE AMPUTATION Left    TONSILLECTOMY     TOTAL KNEE ARTHROPLASTY  07/2002   RIGHT KNEE ; SURGEON  DR. [name] ALSO HAD ARTHROSCOPIC RIGHT KNEE IN  10/2001   TOTAL KNEE ARTHROPLASTY     ULNAR NERVE TRANSPOSITION  Right 10/21/2013   Procedure: RIGHT ELBOW  ULNAR NERVE DECOMPRESSION;  Surgeon: Nicki Reaper, MD;  Location: [facility];  Service: Orthopedics;  Laterality: Right;    FAMILY HISTORY: The patient's family history includes Arthritis in an other family member; Cancer in his father and sister; Colon cancer in his brother; Diabetes in his brother and father; Heart attack in his brother and father; Heart disease in his father; Heart disease (age of onset: 77) in his brother; Hyperlipidemia in his brother, father, and sister; Hypertension in his brother, father,  sister, son, and another family member; Varicose Veins in his father.   SOCIAL HISTORY:  The patient  reports that he has been smoking cigarettes. He has a 17.5 pack-year smoking history. He has never used smokeless tobacco. He reports that he does not currently use alcohol. He reports that he does not use drugs.  MEDICATIONS: Current Outpatient Medications  Medication Instructions   amiodarone (PACERONE) 200 mg, Oral, Daily   amLODipine (NORVASC) 10 mg, Oral, Daily   apixaban (ELIQUIS) 5 mg, Oral, 2 times daily   atorvastatin (LIPITOR) 40 mg, Oral, Daily at bedtime   B Complex-C-Zn-Folic Acid (DIALYVITE/ZINC) TABS 1 tablet, Oral, Every T-Th-Sa (Hemodialysis)   famotidine (PEPCID) 20 mg, Oral, Every other day   fentaNYL (DURAGESIC) 50 MCG/HR 1 patch, Transdermal, every 72 hours   folic acid (FOLVITE) 1 mg, Oral, Daily   Glucosamine HCl (GLUCOSAMINE PO) 500 mg, Oral, 2 times daily   insulin glargine (LANTUS) 20 Units, Subcutaneous, Daily at bedtime   isosorbide mononitrate (IMDUR) 60 mg, Oral, Daily   lidocaine-prilocaine (EMLA) cream 1 application , Topical, See admin instructions, Prior to Dialysis days Tuesday,Thursday and saturday   loratadine (CLARITIN) 10 mg, Oral, Daily   metoCLOPramide (REGLAN) 5 mg, Oral, 3 times daily before meals & bedtime   NEEDLE, REUSABLE, 22 G 22G X 1-1/2" MISC 1 Units, Does not apply, As  directed, For B12 IM inj   nitroGLYCERIN (NITROSTAT) 0.4 mg, Sublingual, Every 5 min PRN   NovoLOG FlexPen 0-12 Units, Subcutaneous, See admin instructions, Inject as per sliding scale: If 0 - 69 = 0 initiate hypoglycemia orders; <BR>70 - 150 = 0; <BR>151 - 200 = 2; <BR>201 - 250 = 4; <BR>251 - 300 = 6; <BR>301 - 350 = 8; <BR>351 - 400 = 10; <BR>401 - 450 = 12. <BR>If >400, give 12 units, recheck in 1 hour. If still >400, call MD/NP for further orders. Subcutaneously with meals in addition to scheduled 4 unites with meals. <BR><BR><BR>   omega-3 acid ethyl esters (LOVAZA) 2 g, Oral, 2 times daily   pantoprazole (PROTONIX) 40 MG tablet Take 1 tablet (40 mg total) by mouth 2 (two) times daily for 30 days, THEN 1 tablet (40 mg total) daily.   Polyvinyl Alcohol-Povidone PF 1.4-0.6 % SOLN 1 drop, Both Eyes, 2 times daily PRN   pregabalin (LYRICA) 100 mg, Oral, 2 times daily   rOPINIRole (REQUIP) 2 mg, Oral, Daily at bedtime   senna-docusate (SENOKOT-S) 8.6-50 MG tablet 2 tablets, Oral, Daily at bedtime   Syringe/Needle, Disp, (SYRINGE 3CC/22GX1-1/2") 22G X 1-1/2" 3 ML MISC 1 Syringe, Does not apply, As directed, For b12 IM inj   tamsulosin (FLOMAX) 0.4 mg, Oral, Daily    REVIEW OF SYSTEMS: Review of Systems  Cardiovascular:  Positive for chest pain (On arrival, currently asymptomatic). Negative for claudication, dyspnea on exertion, irregular heartbeat, leg swelling, near-syncope, orthopnea, palpitations, paroxysmal nocturnal dyspnea and syncope.  Respiratory:  Negative for shortness of breath.   Hematologic/Lymphatic: Negative for bleeding problem.  Musculoskeletal:  Negative for muscle cramps and myalgias.  Neurological:  Negative for dizziness and light-headedness.  All other systems reviewed and are negative.   PHYSICAL EXAMINATION: PHYSICAL EXAM: Temp:  [97.9 F (36.6 C)-98.2 F (36.8 C)] 98.2 F (36.8 C) (07/21 1601) Pulse Rate:  [69-82] 74 (07/21 1611) Resp:  [12-23] 20 (07/21  1630) BP: (90-211)/(32-136) 140/60 (07/21 1630) SpO2:  [94 %-100 %] 98 % (07/21 1601) Weight:  [110.2 kg] 110.2 kg (07/21 0415)  Intake/Output:  Intake/Output Summary (  Last 24 hours) at 03/31/2023 1645 Last data filed at 03/31/2023 1406 Gross per 24 hour  Intake 427.18 ml  Output 750 ml  Net -322.82 ml     Net IO Since Admission: -322.82 mL [03/31/23 1645]  Weights:     03/31/2023    4:15 AM 03/30/2023    9:45 PM 03/27/2023    2:55 PM  Last 3 Weights  Weight (lbs) 242 lb 15.2 oz 242 lb 15.2 oz 250 lb  Weight (kg) 110.2 kg 110.2 kg 113.399 kg   Today's Vitals   03/31/23 1601 03/31/23 1611 03/31/23 1615 03/31/23 1630  BP: (!) 121/49 (!) 150/49 (!) 150/48 (!) 140/60  Pulse: 70 74    Resp: 15 17 18 20   Temp: 98.2 F (36.8 C)     TempSrc: Oral     SpO2: 98%     Weight:      PainSc:       Body mass index is 31.19 kg/m.    Physical Exam  Constitutional:  Appears older than stated age,  hemodynamically stable.   Neck: No JVD present.  Cardiovascular: Normal rate, regular rhythm, S1 normal, S2 normal, intact distal pulses and normal pulses. Exam reveals no gallop, no S3 and no S4.  Murmur heard. Pulses:      Carotid pulses are  on the left side with bruit. Pulmonary/Chest: Effort normal and breath sounds normal. No stridor. He has no wheezes. He has no rales.  Abdominal: Soft. Bowel sounds are normal. He exhibits no distension. There is no abdominal tenderness.  Musculoskeletal:        General: No edema.     Cervical back: Neck supple.     Comments: Bilateral BKA  Neurological: He is alert and oriented to person, place, and time. He has intact cranial nerves (2-12).  Skin: Skin is warm and moist.    LAB RESULTS: Chemistry Recent Labs  Lab 03/30/23 1344 03/30/23 1922 03/31/23 0314  NA 135  --  138  K 4.3  --  3.9  CL 97*  --  95*  CO2 24  --  26  GLUCOSE 171*  --  106*  BUN 38*  --  46*  CREATININE 5.97*  --  6.32*  CALCIUM 8.4*  --  8.6*  PROT  --  6.0*  5.4*  ALBUMIN  --  3.0* 2.7*  AST  --  24 21  ALT  --  27 25  ALKPHOS  --  119 108  BILITOT  --  0.4 0.5  GFRNONAA 10*  --  9*  ANIONGAP 14  --  17*    Hematology Recent Labs  Lab 03/30/23 1344 03/30/23 1922 03/31/23 0314  WBC 8.7  --  7.6  RBC 3.52* 3.52* 3.18*  HGB 10.7*  --  9.8*  HCT 32.5*  --  29.2*  MCV 92.3  --  91.8  MCH 30.4  --  30.8  MCHC 32.9  --  33.6  RDW 13.2  --  13.1  PLT 180  --  166   High Sensitivity Troponin:   Recent Labs  Lab 03/30/23 1344 03/30/23 1652 03/30/23 1922 03/30/23 2307  TROPONINIHS 24* 26* 30* 26*     Cardiac EnzymesNo results for input(s): "TROPONINI" in the last 168 hours. No results for input(s): "TROPIPOC" in the last 168 hours.  BNPNo results for input(s): "BNP", "PROBNP" in the last 168 hours.  DDimer  Recent Labs  Lab 03/31/23 1402  DDIMER 0.42    Hemoglobin  A1c:  Lab Results  Component Value Date   HGBA1C 6.1 (H) 03/30/2023   MPG 128.37 03/30/2023   TSH  Recent Labs    03/30/23 1922  TSH 1.211   Lipid Panel  Lab Results  Component Value Date   CHOL 104 03/31/2023   HDL 23 (L) 03/31/2023   LDLCALC 54 03/31/2023   TRIG 135 03/31/2023   CHOLHDL 4.5 03/31/2023   Drugs of Abuse     Component Value Date/Time   LABOPIA NONE DETECTED 07/30/2016 1756   COCAINSCRNUR NONE DETECTED 07/30/2016 1756   LABBENZ NONE DETECTED 07/30/2016 1756   AMPHETMU NONE DETECTED 07/30/2016 1756   THCU NONE DETECTED 07/30/2016 1756   LABBARB NONE DETECTED 07/30/2016 1756      CARDIAC DATABASE: EKG: March 30, 2023: Sinus rhythm, first-degree AV block, 73 bpm, right bundle branch block, left anterior fascicular block, LVH per voltage criteria.  March 31, 2023: Sinus rhythm with first-degree AV block, 69 bpm, RBBB, left anterior fascicular block.  Echocardiogram: 03/31/2023  1. Left ventricular ejection fraction, by estimation, is 60 to 65%. The  left ventricle has normal function. The left ventricle has no regional  wall  motion abnormalities. There is mild concentric left ventricular  hypertrophy. Left ventricular diastolic  parameters are consistent with Grade I diastolic dysfunction (impaired  relaxation). Elevated left atrial pressure.   2. Right ventricular systolic function is normal. The right ventricular  size is normal.   3. Left atrial size was moderately dilated.   4. The mitral valve is normal in structure. No evidence of mitral valve  regurgitation. No evidence of mitral stenosis. Moderate mitral annular  calcification.   5. The aortic valve is tricuspid. Aortic valve regurgitation is not  visualized. Aortic valve sclerosis/calcification is present, without any  evidence of aortic stenosis.   6. The inferior vena cava is dilated in size with >50% respiratory  variability, suggesting right atrial pressure of 8 mmHg.   Heart Catheterization: Coronary angiogram 09/17/18: Widely patent stent stents. Normal LVEF.   Coronary Angiogram 10/29/2014: CAD s/p stenting of the distal, mid and proximal RCA with implantation of 3 overlapping drug-eluting stent, from distal to proximal 2.5 x 38 mm, 2.75 x 38 mm and a 2.75 x 20 mm Promus premier DES.    Carotid artery duplex  06/09/2019:  Minimal stenosis in bilateral ICA of 1-15%. Heterogeneous plaque. Stenosis  in the right external carotid artery (<50%).  Left carotid endarterectomy site is patent.  Right vertebral artery flow is not well visualized. Antegrade left  vertebral artery flow.  No significant change from 03/01/2017. Follow up studies when clinically  Indicated   Scheduled Meds:  amiodarone  100 mg Oral Daily   amLODipine  10 mg Oral Daily   apixaban  5 mg Oral BID   aspirin EC  81 mg Oral Daily   atorvastatin  40 mg Oral QHS   Chlorhexidine Gluconate Cloth  6 each Topical Q0600   famotidine  20 mg Oral QODAY   fentaNYL  1 patch Transdermal Q72H   folic acid  1 mg Oral Daily   hydrALAZINE  50 mg Oral Q8H   insulin aspart  0-6 Units  Subcutaneous Q4H   insulin glargine-yfgn  10 Units Subcutaneous QHS   [START ON 04/01/2023] isosorbide mononitrate  60 mg Oral Daily   labetalol  200 mg Oral BID   loratadine  10 mg Oral Daily   mupirocin ointment  1 Application Nasal BID   pantoprazole  40 mg  Oral Daily   pregabalin  100 mg Oral BID   rOPINIRole  2 mg Oral QHS   sodium chloride flush  3 mL Intravenous Q12H   tamsulosin  0.4 mg Oral Daily    Continuous Infusions:  sodium chloride     nitroGLYCERIN 5 mcg/min (03/31/23 1630)    PRN Meds: sodium chloride, acetaminophen **OR** acetaminophen, metoCLOPramide, oxyCODONE-acetaminophen, sodium chloride flush  IMPRESSION & RECOMMENDATIONS: [name] is a 67 y.o. Caucasian male whose past medical history and cardiovascular risk factors include: Severe peripheral artery disease with prior PVI, status post bilateral BKA, uncontrolled diabetes, end-stage renal disease, cardiomyopathy, coronary disease status history of coronary interventions, right carotid endarterectomy in 2012, OSA unable to tolerate CPAP, chronic back and neck pain, paroxysmal atrial fibrillation.  Impression:  Precordial chest pain Established coronary disease with prior coronary intervention. Hypertensive crisis -with SBP as high as 211 mmHg Known PAD status post PVI and bilateral BKA Cigarette smoker History of right carotid endarterectomy OSA not on CPAP Paroxysmal atrial fibrillation  Plan:  His precordial pain is not classic of cardiac discomfort-overall accuracy is limited as he is nonambulatory.  EKG shows a sinus rhythm without underlying ischemia or injury pattern.  High sensitive troponins have been slightly elevated but essentially flat likely due to reduced clearance given his ESRD and hypertensive crisis.  Echocardiogram this morning which notes preserved LVEF without regional wall motion abnormalities or significant valvular heart disease.  His presentation is not suggestive of acute  coronary syndrome.  Discontinue IV heparin drip.  However given his multiple cardiovascular risk factors and chest pain we did discuss ischemic workup during this hospitalization with either stress test or left heart catheterization with possible intervention.  However, patient wants to hold off additional testing at this time and focus on blood pressure management.  If the chest discomfort continues despite improvement of blood pressure management he would be agreeable with additional workup.  This is quite reasonable.  Start hydralazine 50 mg p.o. 3 times daily Start labetalol 200 mg p.o. twice daily Wean off nitro drip.  Reeducated him on the importance of improving his modifiable cardiovascular risk factors.  Hemodialysis per primary and nephrology team.  Patient currently states that his blood pressure pills are held on his dialysis days.  But given his uncontrolled hypertension I would think it is reasonable to give him blood pressure pills after his dialysis sessions if his SBP is greater than 140 mmHg.  This needs to be discussed with his facility.  Telemetry notes sinus rhythm. LDL levels are well-controlled at 54 mg/dL Last hemoglobin K4M [id number]  Total encounter time 63 minutes. *Total Encounter Time as defined by the Centers for Medicare and Medicaid Services includes, in addition to the face-to-face time of a patient visit (documented in the note above) non-face-to-face time: obtaining and reviewing outside history, ordering and reviewing medications, tests or procedures, care coordination (communications with other health care professionals or caregivers) and documentation in the medical record.  Patient's questions and concerns were addressed to his satisfaction. He voices understanding of the instructions provided during this encounter.   This note was created using a voice recognition software as a result there may be grammatical errors inadvertently enclosed that  do not reflect the nature of this encounter. Every attempt is made to correct such errors.  Delilah Shan [facility]  Pager:  [phone] Office: [phone] 03/31/2023, 4:45 PM

## 2023-03-31 NOTE — Progress Notes (Signed)
*  PRELIMINARY RESULTS* Echocardiogram 2D Echocardiogram has been performed.  [name] 03/31/2023, 9:57 AM

## 2023-04-01 ENCOUNTER — Ambulatory Visit: Payer: Medicare HMO | Admitting: Orthopedic Surgery

## 2023-04-01 DIAGNOSIS — I2510 Atherosclerotic heart disease of native coronary artery without angina pectoris: Secondary | ICD-10-CM | POA: Diagnosis not present

## 2023-04-01 DIAGNOSIS — R0902 Hypoxemia: Secondary | ICD-10-CM | POA: Diagnosis not present

## 2023-04-01 DIAGNOSIS — E119 Type 2 diabetes mellitus without complications: Secondary | ICD-10-CM | POA: Diagnosis not present

## 2023-04-01 DIAGNOSIS — Z955 Presence of coronary angioplasty implant and graft: Secondary | ICD-10-CM | POA: Diagnosis not present

## 2023-04-01 DIAGNOSIS — I161 Hypertensive emergency: Secondary | ICD-10-CM | POA: Diagnosis not present

## 2023-04-01 DIAGNOSIS — Z7901 Long term (current) use of anticoagulants: Secondary | ICD-10-CM | POA: Diagnosis not present

## 2023-04-01 DIAGNOSIS — Z89511 Acquired absence of right leg below knee: Secondary | ICD-10-CM | POA: Diagnosis not present

## 2023-04-01 DIAGNOSIS — I10 Essential (primary) hypertension: Secondary | ICD-10-CM

## 2023-04-01 DIAGNOSIS — I25110 Atherosclerotic heart disease of native coronary artery with unstable angina pectoris: Secondary | ICD-10-CM | POA: Diagnosis not present

## 2023-04-01 DIAGNOSIS — R072 Precordial pain: Secondary | ICD-10-CM | POA: Diagnosis not present

## 2023-04-01 DIAGNOSIS — I169 Hypertensive crisis, unspecified: Secondary | ICD-10-CM | POA: Diagnosis not present

## 2023-04-01 DIAGNOSIS — Z7401 Bed confinement status: Secondary | ICD-10-CM | POA: Diagnosis not present

## 2023-04-01 DIAGNOSIS — G4733 Obstructive sleep apnea (adult) (pediatric): Secondary | ICD-10-CM | POA: Diagnosis not present

## 2023-04-01 DIAGNOSIS — Z89512 Acquired absence of left leg below knee: Secondary | ICD-10-CM | POA: Diagnosis not present

## 2023-04-01 DIAGNOSIS — I480 Paroxysmal atrial fibrillation: Secondary | ICD-10-CM | POA: Diagnosis not present

## 2023-04-01 DIAGNOSIS — N186 End stage renal disease: Secondary | ICD-10-CM | POA: Diagnosis not present

## 2023-04-01 DIAGNOSIS — I120 Hypertensive chronic kidney disease with stage 5 chronic kidney disease or end stage renal disease: Secondary | ICD-10-CM | POA: Diagnosis not present

## 2023-04-01 LAB — GLUCOSE, CAPILLARY
Glucose-Capillary: 101 mg/dL — ABNORMAL HIGH (ref 70–99)
Glucose-Capillary: 111 mg/dL — ABNORMAL HIGH (ref 70–99)
Glucose-Capillary: 128 mg/dL — ABNORMAL HIGH (ref 70–99)
Glucose-Capillary: 164 mg/dL — ABNORMAL HIGH (ref 70–99)
Glucose-Capillary: 98 mg/dL (ref 70–99)

## 2023-04-01 MED ORDER — PREGABALIN 75 MG PO CAPS: 75.0000 mg | ORAL_CAPSULE | Freq: Every day | ORAL | 0 refills | Status: DC

## 2023-04-01 MED ORDER — LABETALOL HCL 200 MG PO TABS: 200.0000 mg | ORAL_TABLET | Freq: Two times a day (BID) | ORAL | Status: DC

## 2023-04-01 MED ORDER — FENTANYL 50 MCG/HR TD PT72: 1.0000 | MEDICATED_PATCH | TRANSDERMAL | 0 refills | Status: DC

## 2023-04-01 MED ORDER — HYDRALAZINE HCL 50 MG PO TABS: 50.0000 mg | ORAL_TABLET | Freq: Three times a day (TID) | ORAL | Status: DC

## 2023-04-01 NOTE — TOC Transition Note (Signed)
Transition of Care [facility]) - CM/SW Discharge Note   Patient Details  Name: [name] MRN: [id number] Date of Birth: [birth year]  Transition of Care [facility]) CM/SW Contact:  Leander Rams, LCSW Phone Number: 04/01/2023, 4:20 PM   Clinical Narrative:    Patient will DC to: [facility] Anticipated DC date: 04/01/2023 Family notified: Lawson Fiscal Transport by: Sharin Mons   Per MD patient ready for DC to [facility]. RN, patient, patient's family, and facility notified of DC. Discharge Summary and FL2 sent to facility. RN to call report prior to discharge [phone]. DC packet on chart. Ambulance transport requested for patient.   CSW will sign off for now as social work intervention is no longer needed. Please consult [place] again if new needs arise.    Final next level of care: Skilled Nursing Facility Barriers to Discharge: No Barriers Identified   Patient Goals and CMS Choice      Discharge Placement                Patient chooses bed at: [facility] Patient to be transferred to facility by: [facility] Name of family member notified: Lawson Fiscal Patient and family notified of of transfer: 04/01/23  Discharge Plan and Services Additional resources added to the After Visit Summary for                                       Social Determinants of Health (SDOH) Interventions SDOH Screenings   Food Insecurity: No Food Insecurity (03/30/2023)  Housing: Low Risk  (03/30/2023)  Transportation Needs: No Transportation Needs (03/30/2023)  Utilities: Not At Risk (03/30/2023)  Tobacco Use: High Risk (03/30/2023)     Readmission Risk Interventions    06/30/2021   11:53 AM  Readmission Risk Prevention Plan  Transportation Screening Complete  Medication Review Oceanographer) Complete  HRI or Home Care Consult Complete  SW Recovery Care/Counseling Consult Complete  Palliative Care Screening Not Applicable  Skilled Nursing Facility Not Applicable     Oletta Lamas, MSW, [place],  [place] Transitions of Care  Clinical Social Worker I

## 2023-04-01 NOTE — Progress Notes (Signed)
Patient ready for d/c to [facility] facility. Report has been called to the accepting nurseGwynneth Macleod). IV and tele has been d/c. Patient awaiting [facility] transport.

## 2023-04-01 NOTE — Discharge Summary (Signed)
Physician Discharge Summary   Patient: [name] MRN: [id number] DOB: [birth year]  Admit date:     03/30/2023  Discharge date: 04/01/23  Discharge Physician: Jacquelin Hawking, MD   PCP: Lonie Peak, PA-C   Recommendations at discharge:  PCP and cardiology follow-up Cardiology recommend to be more aggressive with blood pressure control even with hemodialysis days; recommendation to give antihypertensives on days of hemodialysis if blood pressure is elevated.  Discharge Diagnoses: Active Problems:   Accelerated hypertension   ESRD on hemodialysis (HCC)   CAD (dz of distal, mid and proximal RCA with implantation of 3 overlapping drug-eluting stent,)   Chronic diastolic heart failure, NYHA class 2 (HCC)   Insulin dependent type 2 diabetes mellitus (HCC)   PAD (peripheral artery disease) (HCC)   Atrial fibrillation, chronic (HCC)   GERD (gastroesophageal reflux disease)   Normocytic anemia   S/P BKA (below knee amputation), right (HCC)   OSA not tolerating CPAP   Chronic obstructive pulmonary disease, unspecified (HCC)   Chest wall pain   Unstable angina (HCC)   Tobacco abuse   Prolonged QT interval   Elevated troponin   Precordial pain   History of right coronary artery stent placement   Current use of long term anticoagulation   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)   Paroxysmal atrial fibrillation (HCC)   Hypertensive crisis  Resolved Problems:   * No resolved hospital problems. *  Hospital Course: [name] is a 67 y.o. male with a history of ESRD on hemodialysis, diabetes mellitus type 2, PAD, bilateral BKA, diabetic retinopathy, carotid artery stenosis status post endarterectomy, OSA, chronic back pain, paroxysmal atrial fibrillation on Eliquis.  Patient presented secondary to chest pain with concern for hypertensive emergency.  Cardiology consulted as well.  Patient started on nitroglycerin drip and antihypertensives adjusted.  Blood  pressure.  Nephrology consulted for inpatient hemodialysis as patient may be volume overloaded secondary to missed hemodialysis.  Assessment and Plan:  Chest pain Concern for possible anginal symptoms initially. Transthoracic Echocardiogram obtained and significant for normal LVEF of 60-65% with no regional wall motion abnormality. Chest pain resolved with improved blood pressure control. Ischemic workup deferred for now. Patient to follow-up with cardiology as an outpatient.   Hypertensive emergency Present on admission.  Patient is on amlodipine and Imdur as an outpatient.  Possibly contributed to by missed dialysis as well.  Associated chest pain with concern for unstable angina.  Home antihypertensives resumed.  Nitroglycerin drip started. Cardiology adjusted antihypertensives. Nitroglycerin weaned off. Patient to discharge on amlodipine 10 mg daily, hydralazine q8 hours daily, Imdur 60 mg daily and labetalol 200 mg BID.   ESRD on HD Patient was meant to have hemodialysis on 7/20 as an outpatient, which was deferred secondary to symptoms of chest pain. Nephrology consulted for inpatient hemodialysis needs.   Chronic diastolic heart failure Stable.  No evidence of acute heart failure.   PAD Continue Lipitor and aspirin   Chronic atrial fibrillation Patient is on amiodarone and Eliquis as an outpatient.  Currently in sinus rhythm.  Eliquis held secondary to initiation of heparin drip. Continue amiodarone. Eliquis restarted.  GERD Continue Protonix   Normocytic anemia Stable.   S/p right BKA Noted.   COPD Stable.   Chronic pain Patient is prescribed fentanyl patch 50 mcg/h every 3 days and Lyrica. Lyrica dose adjusted for renal disease.   OSA Not using CPAP per patient preference   Tobacco use Noted. Counseled on admission.   Prolonged  QTc Associated bundle branch block affecting measurement.  Consultants: Cardiology, Nephrology Procedures performed: Hemodialysis,  Transthoracic Echocardiogram   Disposition: Skilled nursing facility Diet recommendation: Renal diet   DISCHARGE MEDICATION: Allergies as of 04/01/2023       Reactions   Contrast Media [iodinated Contrast Media] Shortness Of Breath, Other (See Comments)   Difficulty breathing and altered mental status   Ivp Dye [iodinated Contrast Media] Anaphylaxis, Shortness Of Breath, Other (See Comments)   Breathing problems, altered mental state   Adhesive [tape] Rash, Other (See Comments)   Rash after 1 day of use Do not leave on longer then 3 days with out be changed   Latex Rash, Other (See Comments)   A severe rash appears after the first 24 hours of being placed        Medication List     TAKE these medications    amiodarone 200 MG tablet Commonly known as: PACERONE Take 1 tablet (200 mg total) by mouth daily. What changed: how much to take   amLODipine 10 MG tablet Commonly known as: NORVASC Take 1 tablet (10 mg total) by mouth daily.   apixaban 5 MG Tabs tablet Commonly known as: ELIQUIS Take 1 tablet (5 mg total) by mouth 2 (two) times daily.   atorvastatin 40 MG tablet Commonly known as: LIPITOR Take 40 mg by mouth at bedtime.   Dialyvite/Zinc Tabs Take 1 tablet by mouth Every Tuesday,Thursday,and Saturday with dialysis.   famotidine 20 MG tablet Commonly known as: PEPCID Take 20 mg by mouth every other day.   fentaNYL 50 MCG/HR Commonly known as: DURAGESIC Place 1 patch onto the skin every 3 (three) days.   folic acid 1 MG tablet Commonly known as: FOLVITE Take 1 mg by mouth daily.   GLUCOSAMINE PO Take 500 mg by mouth 2 (two) times daily.   hydrALAZINE 50 MG tablet Commonly known as: APRESOLINE Take 1 tablet (50 mg total) by mouth every 8 (eight) hours.   insulin glargine 100 UNIT/ML injection Commonly known as: LANTUS Inject 0.2 mLs (20 Units total) into the skin at bedtime.   isosorbide mononitrate 60 MG 24 hr tablet Commonly known as:  IMDUR Take 1 tablet (60 mg total) by mouth daily.   labetalol 200 MG tablet Commonly known as: NORMODYNE Take 1 tablet (200 mg total) by mouth 2 (two) times daily.   lidocaine-prilocaine cream Commonly known as: EMLA Apply 1 application topically See admin instructions. Prior to Dialysis days Tuesday,Thursday and saturday   loratadine 10 MG tablet Commonly known as: CLARITIN Take 10 mg by mouth daily.   metoCLOPramide 5 MG tablet Commonly known as: Reglan Take 1 tablet (5 mg total) by mouth 4 (four) times daily -  before meals and at bedtime. What changed: when to take this   NEEDLE (REUSABLE) 22 G 22G X 1-1/2" Misc 1 Units by Does not apply route as directed. For B12 IM inj   nitroGLYCERIN 0.4 MG SL tablet Commonly known as: NITROSTAT Place 0.4 mg under the tongue every 5 (five) minutes as needed for chest pain.   NovoLOG FlexPen 100 UNIT/ML FlexPen Generic drug: insulin aspart Inject 0-12 Units into the skin See admin instructions. Inject as per sliding scale: If 0 - 69 = 0 initiate hypoglycemia orders;  70 - 150 = 0;  151 - 200 = 2;  201 - 250 = 4;  251 - 300 = 6;  301 - 350 = 8;  351 - 400 = 10;  401 - 450 =  12.  If >400, give 12 units, recheck in 1 hour. If still >400, call MD/NP for further orders. Subcutaneously with meals in addition to scheduled 4 unites with meals.   omega-3 acid ethyl esters 1 g capsule Commonly known as: LOVAZA Take 2 g by mouth 2 (two) times daily.   pantoprazole 40 MG tablet Commonly known as: PROTONIX Take 1 tablet (40 mg total) by mouth 2 (two) times daily for 30 days, THEN 1 tablet (40 mg total) daily. Start taking on: Jan 14, 2022 What changed: See the new instructions.   Polyvinyl Alcohol-Povidone PF 1.4-0.6 % Soln Place 1 drop into both eyes 2 (two) times daily as needed (dry eyes).   pregabalin 75 MG capsule Commonly known as: LYRICA Take 1 capsule (75 mg total) by mouth daily for 5 days. What changed:  medication  strength how much to take when to take this   rOPINIRole 2 MG tablet Commonly known as: REQUIP Take 2 mg by mouth at bedtime.   senna-docusate 8.6-50 MG tablet Commonly known as: Senokot-S Take 2 tablets by mouth at bedtime.   SYRINGE 3CC/22GX1-1/2" 22G X 1-1/2" 3 ML Misc 1 Syringe by Does not apply route as directed. For b12 IM inj   tamsulosin 0.4 MG Caps capsule Commonly known as: FLOMAX Take 0.4 mg by mouth daily.        Follow-up Information     Yates Decamp, MD. Go on 05/07/2023.   Specialty: Cardiology Why: At 11:45 AM. Status post hospitalization. Reevaluation of chest pain. Contact information: [address] [place] [address] [phone]                Discharge Exam: BP (!) 149/48   Pulse 74   Temp 98.4 F (36.9 C) (Oral)   Resp 20   Wt 107.8 kg   SpO2 96%   BMI 30.51 kg/m   General exam: Appears calm and comfortable Respiratory system: Clear to auscultation. Respiratory effort normal. Cardiovascular system: S1 & S2 heard, RRR. Gastrointestinal system: Abdomen is nondistended, soft and nontender. No organomegaly or masses felt. Normal bowel sounds heard. Central nervous system: Alert and oriented. No focal neurological deficits. Musculoskeletal: No edema. Bilateral BKA Skin: No cyanosis. No rashes Psychiatry: Judgement and insight appear normal. Mood & affect appropriate.   Condition at discharge: stable  The results of significant diagnostics from this hospitalization (including imaging, microbiology, ancillary and laboratory) are listed below for reference.   Imaging Studies: ECHOCARDIOGRAM COMPLETE  Result Date: 03/31/2023    ECHOCARDIOGRAM REPORT   Patient Name:   [name] Date of Exam: 03/31/2023 Medical Rec #:  [id number]     Height:       74.0 in Accession #:    [id number]    Weight:       242.9 lb Date of Birth:  05/18/1956     BSA:          2.361 m Patient Age:    66 years      BP:           197/106 mmHg Patient  Gender: M             HR:           71 bpm. Exam Location:  Inpatient Procedure: 2D Echo, Cardiac Doppler and Color Doppler Indications:    Elevated Troponin; CAD  History:        Patient has prior history of Echocardiogram examinations, most  recent 06/25/2022. CAD, ESRD, Carotid Disease and PAD,                 Arrythmias:Atrial Fibrillation; Risk Factors:Current Smoker,                 Dyslipidemia, Diabetes, Hypertension and Sleep Apnea.  Sonographer:    Dondra Prader RVT RCS Referring Phys: [id number] ANASTASSIA DOUTOVA IMPRESSIONS  1. Left ventricular ejection fraction, by estimation, is 60 to 65%. The left ventricle has normal function. The left ventricle has no regional wall motion abnormalities. There is mild concentric left ventricular hypertrophy. Left ventricular diastolic parameters are consistent with Grade I diastolic dysfunction (impaired relaxation). Elevated left atrial pressure.  2. Right ventricular systolic function is normal. The right ventricular size is normal.  3. Left atrial size was moderately dilated.  4. The mitral valve is normal in structure. No evidence of mitral valve regurgitation. No evidence of mitral stenosis. Moderate mitral annular calcification.  5. The aortic valve is tricuspid. Aortic valve regurgitation is not visualized. Aortic valve sclerosis/calcification is present, without any evidence of aortic stenosis.  6. The inferior vena cava is dilated in size with >50% respiratory variability, suggesting right atrial pressure of 8 mmHg. FINDINGS  Left Ventricle: Left ventricular ejection fraction, by estimation, is 60 to 65%. The left ventricle has normal function. The left ventricle has no regional wall motion abnormalities. The left ventricular internal cavity size was normal in size. There is  mild concentric left ventricular hypertrophy. Left ventricular diastolic parameters are consistent with Grade I diastolic dysfunction (impaired relaxation). Elevated left atrial  pressure. Right Ventricle: The right ventricular size is normal. Right ventricular systolic function is normal. Left Atrium: Left atrial size was moderately dilated. Right Atrium: Right atrial size was normal in size. Pericardium: There is no evidence of pericardial effusion. Mitral Valve: The mitral valve is normal in structure. Moderate mitral annular calcification. No evidence of mitral valve regurgitation. No evidence of mitral valve stenosis. MV peak gradient, 6.2 mmHg. The mean mitral valve gradient is 3.0 mmHg. Tricuspid Valve: The tricuspid valve is normal in structure. Tricuspid valve regurgitation is trivial. No evidence of tricuspid stenosis. Aortic Valve: The aortic valve is tricuspid. Aortic valve regurgitation is not visualized. Aortic valve sclerosis/calcification is present, without any evidence of aortic stenosis. Aortic valve mean gradient measures 8.0 mmHg. Aortic valve peak gradient measures 14.7 mmHg. Aortic valve area, by VTI measures 3.15 cm. Pulmonic Valve: The pulmonic valve was normal in structure. Pulmonic valve regurgitation is trivial. No evidence of pulmonic stenosis. Aorta: The aortic root is normal in size and structure. Venous: The inferior vena cava is dilated in size with greater than 50% respiratory variability, suggesting right atrial pressure of 8 mmHg. IAS/Shunts: No atrial level shunt detected by color flow Doppler.  LEFT VENTRICLE PLAX 2D LVIDd:         5.30 cm   Diastology LVIDs:         3.00 cm   LV e' medial:    5.77 cm/s LV PW:         1.20 cm   LV E/e' medial:  21.1 LV IVS:        1.40 cm   LV e' lateral:   7.72 cm/s LVOT diam:     2.20 cm   LV E/e' lateral: 15.8 LV SV:         125 LV SV Index:   53 LVOT Area:     3.80 cm  RIGHT VENTRICLE  IVC RV Basal diam:  4.30 cm     IVC diam: 2.50 cm RV S prime:     13.80 cm/s TAPSE (M-mode): 2.5 cm LEFT ATRIUM              Index        RIGHT ATRIUM           Index LA diam:        5.00 cm  2.12 cm/m   RA Area:      21.90 cm LA Vol (A2C):   126.0 ml 53.37 ml/m  RA Volume:   72.00 ml  30.50 ml/m LA Vol (A4C):   102.7 ml 43.50 ml/m LA Biplane Vol: 121.0 ml 51.25 ml/m  AORTIC VALVE                     PULMONIC VALVE AV Area (Vmax):    2.87 cm      PV Vmax:       0.89 m/s AV Area (Vmean):   2.99 cm      PV Peak grad:  3.2 mmHg AV Area (VTI):     3.15 cm AV Vmax:           192.00 cm/s AV Vmean:          127.000 cm/s AV VTI:            0.398 m AV Peak Grad:      14.7 mmHg AV Mean Grad:      8.0 mmHg LVOT Vmax:         145.00 cm/s LVOT Vmean:        100.000 cm/s LVOT VTI:          0.330 m LVOT/AV VTI ratio: 0.83  AORTA Ao Root diam: 3.50 cm Ao Asc diam:  3.60 cm MITRAL VALVE MV Area (PHT): 3.27 cm     SHUNTS MV Area VTI:   3.74 cm     Systemic VTI:  0.33 m MV Peak grad:  6.2 mmHg     Systemic Diam: 2.20 cm MV Mean grad:  3.0 mmHg MV Vmax:       1.25 m/s MV Vmean:      83.0 cm/s MV Decel Time: 232 msec MV E velocity: 122.00 cm/s MV A velocity: 146.00 cm/s MV E/A ratio:  0.84 Olga Millers MD Electronically signed by Olga Millers MD Signature Date/Time: 03/31/2023/11:20:07 AM    Final    DG Chest 2 View  Result Date: 03/30/2023 CLINICAL DATA:  Left-sided chest pain with shortness of breath for 3 days. Dialysis patient. EXAM: CHEST - 2 VIEW COMPARISON:  Radiographs 11/19/2022 and 06/23/2022. Chest CT 06/23/2022. FINDINGS: The heart size and mediastinal contours are stable. There is aortic atherosclerosis. Interval improved aeration of both lungs without evidence of focal airspace disease, edema, pleural effusion or pneumothorax. Probable mild chronic central airway thickening. Previous lower cervical fusion noted. No acute osseous findings are seen. IMPRESSION: No evidence of acute cardiopulmonary process. Interval improved aeration of both lungs. Electronically Signed   By: Carey Bullocks M.D.   On: 03/30/2023 14:16    Microbiology: Results for orders placed or performed during the hospital encounter of 03/30/23   MRSA Next Gen by PCR, Nasal     Status: Abnormal   Collection Time: 03/31/23  7:21 AM   Specimen: Nasal Mucosa; Nasal Swab  Result Value Ref Range Status   MRSA by PCR Next Gen DETECTED (A) NOT DETECTED Final    Comment: CRITICAL  RESULT CALLED TO, READ BACK BY AND VERIFIED WITH: RN Ardeen Fillers [phone] [id number] BY J RAZZAK,MT (NOTE) The GeneXpert MRSA Assay (FDA approved for NASAL specimens only), is one component of a comprehensive MRSA colonization surveillance program. It is not intended to diagnose MRSA infection nor to guide or monitor treatment for MRSA infections. Test performance is not FDA approved in patients less than 6 years old. Performed at [facility], [address], [place], [place] [address]    *Note: Due to a large number of results and/or encounters for the requested time period, some results have not been displayed. A complete set of results can be found in Results Review.    Labs: CBC: Recent Labs  Lab 03/30/23 1344 03/31/23 0314  WBC 8.7 7.6  HGB 10.7* 9.8*  HCT 32.5* 29.2*  MCV 92.3 91.8  PLT 180 166   Basic Metabolic Panel: Recent Labs  Lab 03/30/23 1344 03/30/23 1901 03/31/23 0314  NA 135  --  138  K 4.3  --  3.9  CL 97*  --  95*  CO2 24  --  26  GLUCOSE 171*  --  106*  BUN 38*  --  46*  CREATININE 5.97*  --  6.32*  CALCIUM 8.4*  --  8.6*  MG  --  1.8 1.8  PHOS  --   --  5.8*   Liver Function Tests: Recent Labs  Lab 03/30/23 1922 03/31/23 0314  AST 24 21  ALT 27 25  ALKPHOS 119 108  BILITOT 0.4 0.5  PROT 6.0* 5.4*  ALBUMIN 3.0* 2.7*   CBG: Recent Labs  Lab 03/31/23 1147 03/31/23 1603 03/31/23 2119 04/01/23 0009 04/01/23 0642  GLUCAP 111* 152* 117* 101* 98    Discharge time spent: 35 minutes.  Signed: Jacquelin Hawking, MD [facility] 04/01/2023

## 2023-04-01 NOTE — Progress Notes (Addendum)
Subjective: Seen in room watching TV denies any chest pain /shortness of breath and has no complaints. thinks he will be discharged today // tolerated 4 L UF HD last night / below his outpatient HD  Objective Vital signs in last 24 hours: Vitals:   04/01/23 0755 04/01/23 0830 04/01/23 1014 04/01/23 1135  BP: (!) 161/47  (!) 149/48   Pulse: 66   74  Resp: 18 18 20    Temp: 98.4 F (36.9 C)     TempSrc: Oral   Oral  SpO2: 98%  96%   Weight:       Weight change: -2.4 kg  Physical Exam: General: Alert obese male NAD appropriate Heart: RRR, 1/6 SEM no rub or gallop Lungs: CTA bilaterally nonlabored breathing Abdomen: Obese NABS, soft NTND no ascites appreciated Extremities: Bilateral BKA trace edema Dialysis Access: Positive bruit left arm cephalic vein AV fistula  Problem/Plan: Chest pain= evaluated by cardiology /thought to be hypertensive crisis in etiology= medical treatment, continue hydralazine 50 mg 3 times daily continue labetalol 20 mg twice daily with home medications. ESRD -dialysis TTS, noted had missed Tuesday dialysis treatment tolerated 4 L UF HD yesterday/ stressed to the patient hemodialysis compliance with HD 3 times per week/below EDW will decrease to 108.5 HTN/volume -BP elevated  on admission improved with BP meds and  4 L UF Anemia -Hgb 9.8 was 10.7  , fu op no esa yet , notify op kid center . Secondary hyperparathyroidism -phosphorus 5.8 calcium stable continue VDRA and phosphorus binders A-fib= rate controlled on amiodarone and Eliquis as per cardiology  Lenny Pastel, PA-C [facility] [phone] 04/01/2023,12:26 PM  LOS: 0 days   Labs: Basic Metabolic Panel: Recent Labs  Lab 03/30/23 1344 03/31/23 0314  NA 135 138  K 4.3 3.9  CL 97* 95*  CO2 24 26  GLUCOSE 171* 106*  BUN 38* 46*  CREATININE 5.97* 6.32*  CALCIUM 8.4* 8.6*  PHOS  --  5.8*   Liver Function Tests: Recent Labs  Lab 03/30/23 1922 03/31/23 0314  AST 24 21   ALT 27 25  ALKPHOS 119 108  BILITOT 0.4 0.5  PROT 6.0* 5.4*  ALBUMIN 3.0* 2.7*   No results for input(s): "LIPASE", "AMYLASE" in the last 168 hours. No results for input(s): "AMMONIA" in the last 168 hours. CBC: Recent Labs  Lab 03/30/23 1344 03/31/23 0314  WBC 8.7 7.6  HGB 10.7* 9.8*  HCT 32.5* 29.2*  MCV 92.3 91.8  PLT 180 166   Cardiac Enzymes: No results for input(s): "CKTOTAL", "CKMB", "CKMBINDEX", "TROPONINI" in the last 168 hours. CBG: Recent Labs  Lab 03/31/23 1603 03/31/23 2119 04/01/23 0009 04/01/23 0642 04/01/23 1131  GLUCAP 152* 117* 101* 98 128*    Studies/Results: ECHOCARDIOGRAM COMPLETE  Result Date: 03/31/2023    ECHOCARDIOGRAM REPORT   Patient Name:   [name] Date of Exam: 03/31/2023 Medical Rec #:  [id number]     Height:       74.0 in Accession #:    [id number]    Weight:       242.9 lb Date of Birth:  08/11/1956     BSA:          2.361 m Patient Age:    67 years      BP:           197/106 mmHg Patient Gender: M             HR:  71 bpm. Exam Location:  Inpatient Procedure: 2D Echo, Cardiac Doppler and Color Doppler Indications:    Elevated Troponin; CAD  History:        Patient has prior history of Echocardiogram examinations, most                 recent 06/25/2022. CAD, ESRD, Carotid Disease and PAD,                 Arrythmias:Atrial Fibrillation; Risk Factors:Current Smoker,                 Dyslipidemia, Diabetes, Hypertension and Sleep Apnea.  Sonographer:    Dondra Prader RVT RCS Referring Phys: [id number] ANASTASSIA DOUTOVA IMPRESSIONS  1. Left ventricular ejection fraction, by estimation, is 60 to 65%. The left ventricle has normal function. The left ventricle has no regional wall motion abnormalities. There is mild concentric left ventricular hypertrophy. Left ventricular diastolic parameters are consistent with Grade I diastolic dysfunction (impaired relaxation). Elevated left atrial pressure.  2. Right ventricular systolic function is normal. The  right ventricular size is normal.  3. Left atrial size was moderately dilated.  4. The mitral valve is normal in structure. No evidence of mitral valve regurgitation. No evidence of mitral stenosis. Moderate mitral annular calcification.  5. The aortic valve is tricuspid. Aortic valve regurgitation is not visualized. Aortic valve sclerosis/calcification is present, without any evidence of aortic stenosis.  6. The inferior vena cava is dilated in size with >50% respiratory variability, suggesting right atrial pressure of 8 mmHg. FINDINGS  Left Ventricle: Left ventricular ejection fraction, by estimation, is 60 to 65%. The left ventricle has normal function. The left ventricle has no regional wall motion abnormalities. The left ventricular internal cavity size was normal in size. There is  mild concentric left ventricular hypertrophy. Left ventricular diastolic parameters are consistent with Grade I diastolic dysfunction (impaired relaxation). Elevated left atrial pressure. Right Ventricle: The right ventricular size is normal. Right ventricular systolic function is normal. Left Atrium: Left atrial size was moderately dilated. Right Atrium: Right atrial size was normal in size. Pericardium: There is no evidence of pericardial effusion. Mitral Valve: The mitral valve is normal in structure. Moderate mitral annular calcification. No evidence of mitral valve regurgitation. No evidence of mitral valve stenosis. MV peak gradient, 6.2 mmHg. The mean mitral valve gradient is 3.0 mmHg. Tricuspid Valve: The tricuspid valve is normal in structure. Tricuspid valve regurgitation is trivial. No evidence of tricuspid stenosis. Aortic Valve: The aortic valve is tricuspid. Aortic valve regurgitation is not visualized. Aortic valve sclerosis/calcification is present, without any evidence of aortic stenosis. Aortic valve mean gradient measures 8.0 mmHg. Aortic valve peak gradient measures 14.7 mmHg. Aortic valve area, by VTI measures  3.15 cm. Pulmonic Valve: The pulmonic valve was normal in structure. Pulmonic valve regurgitation is trivial. No evidence of pulmonic stenosis. Aorta: The aortic root is normal in size and structure. Venous: The inferior vena cava is dilated in size with greater than 50% respiratory variability, suggesting right atrial pressure of 8 mmHg. IAS/Shunts: No atrial level shunt detected by color flow Doppler.  LEFT VENTRICLE PLAX 2D LVIDd:         5.30 cm   Diastology LVIDs:         3.00 cm   LV e' medial:    5.77 cm/s LV PW:         1.20 cm   LV E/e' medial:  21.1 LV IVS:        1.40  cm   LV e' lateral:   7.72 cm/s LVOT diam:     2.20 cm   LV E/e' lateral: 15.8 LV SV:         125 LV SV Index:   53 LVOT Area:     3.80 cm  RIGHT VENTRICLE             IVC RV Basal diam:  4.30 cm     IVC diam: 2.50 cm RV S prime:     13.80 cm/s TAPSE (M-mode): 2.5 cm LEFT ATRIUM              Index        RIGHT ATRIUM           Index LA diam:        5.00 cm  2.12 cm/m   RA Area:     21.90 cm LA Vol (A2C):   126.0 ml 53.37 ml/m  RA Volume:   72.00 ml  30.50 ml/m LA Vol (A4C):   102.7 ml 43.50 ml/m LA Biplane Vol: 121.0 ml 51.25 ml/m  AORTIC VALVE                     PULMONIC VALVE AV Area (Vmax):    2.87 cm      PV Vmax:       0.89 m/s AV Area (Vmean):   2.99 cm      PV Peak grad:  3.2 mmHg AV Area (VTI):     3.15 cm AV Vmax:           192.00 cm/s AV Vmean:          127.000 cm/s AV VTI:            0.398 m AV Peak Grad:      14.7 mmHg AV Mean Grad:      8.0 mmHg LVOT Vmax:         145.00 cm/s LVOT Vmean:        100.000 cm/s LVOT VTI:          0.330 m LVOT/AV VTI ratio: 0.83  AORTA Ao Root diam: 3.50 cm Ao Asc diam:  3.60 cm MITRAL VALVE MV Area (PHT): 3.27 cm     SHUNTS MV Area VTI:   3.74 cm     Systemic VTI:  0.33 m MV Peak grad:  6.2 mmHg     Systemic Diam: 2.20 cm MV Mean grad:  3.0 mmHg MV Vmax:       1.25 m/s MV Vmean:      83.0 cm/s MV Decel Time: 232 msec MV E velocity: 122.00 cm/s MV A velocity: 146.00 cm/s MV E/A ratio:   0.84 Olga Millers MD Electronically signed by Olga Millers MD Signature Date/Time: 03/31/2023/11:20:07 AM    Final    DG Chest 2 View  Result Date: 03/30/2023 CLINICAL DATA:  Left-sided chest pain with shortness of breath for 3 days. Dialysis patient. EXAM: CHEST - 2 VIEW COMPARISON:  Radiographs 11/19/2022 and 06/23/2022. Chest CT 06/23/2022. FINDINGS: The heart size and mediastinal contours are stable. There is aortic atherosclerosis. Interval improved aeration of both lungs without evidence of focal airspace disease, edema, pleural effusion or pneumothorax. Probable mild chronic central airway thickening. Previous lower cervical fusion noted. No acute osseous findings are seen. IMPRESSION: No evidence of acute cardiopulmonary process. Interval improved aeration of both lungs. Electronically Signed   By: Carey Bullocks M.D.   On: 03/30/2023 14:16   Medications:  sodium chloride     nitroGLYCERIN Stopped (03/31/23 1633)    amiodarone  100 mg Oral Daily   amLODipine  10 mg Oral Daily   apixaban  5 mg Oral BID   aspirin EC  81 mg Oral Daily   atorvastatin  40 mg Oral QHS   Chlorhexidine Gluconate Cloth  6 each Topical Q0600   famotidine  20 mg Oral QODAY   fentaNYL  1 patch Transdermal Q72H   folic acid  1 mg Oral Daily   hydrALAZINE  50 mg Oral Q8H   insulin aspart  0-6 Units Subcutaneous Q4H   insulin glargine-yfgn  10 Units Subcutaneous QHS   isosorbide mononitrate  60 mg Oral Daily   labetalol  200 mg Oral BID   loratadine  10 mg Oral Daily   mupirocin ointment  1 Application Nasal BID   pantoprazole  40 mg Oral Daily   pregabalin  100 mg Oral BID   rOPINIRole  2 mg Oral QHS   sodium chloride flush  3 mL Intravenous Q12H   tamsulosin  0.4 mg Oral Daily

## 2023-04-01 NOTE — Discharge Instructions (Signed)
[name],  You are in the hospital because of some chest pain.  This appears to be secondary to your significantly elevated blood pressure.  The cardiologist was consulted and adjusted your blood pressure medications.  Thankfully, your blood pressure has improved and still has your chest pain.  While you are here, you also had hemodialysis which I think played a role in improving your blood pressure.  Please follow-up with your primary care physician in addition to your cardiologist.  Please take your new blood pressure medications as directed by your cardiologist recommendations.

## 2023-04-01 NOTE — Plan of Care (Signed)
  Problem: Coping: Goal: Ability to adjust to condition or change in health will improve Outcome: Progressing   Problem: Fluid Volume: Goal: Ability to maintain a balanced intake and output will improve Outcome: Progressing   

## 2023-04-01 NOTE — Progress Notes (Addendum)
Progress Note  Patient Name: [name] MRN: [id number] DOB: [birth year] Date of Encounter: 04/01/2023  Attending physician: [name], MD Primary care provider: Lonie Peak, PA-C Primary Cardiologist: Dr. Yates Decamp  Subjective: [name] is a 67 y.o. Caucasian male who was seen and examined at bedside  No chest pain. No events overnight.    Objective: Vital Signs in the last 24 hours: Temp:  [98.2 F (36.8 C)-98.7 F (37.1 C)] 98.4 F (36.9 C) (07/22 0755) Pulse Rate:  [25-143] 66 (07/22 0755) Resp:  [12-24] 20 (07/22 1014) BP: (90-191)/(28-113) 149/48 (07/22 1014) SpO2:  [74 %-100 %] 96 % (07/22 1014) Weight:  [107.8 kg] 107.8 kg (07/22 0648)  Intake/Output:  Intake/Output Summary (Last 24 hours) at 04/01/2023 1134 Last data filed at 04/01/2023 0803 Gross per 24 hour  Intake 363.11 ml  Output 4450 ml  Net -4086.89 ml    Net IO Since Admission: -4,503.77 mL [04/01/23 1134]  Weights:     04/01/2023    6:48 AM 03/31/2023    4:15 AM 03/30/2023    9:45 PM  Last 3 Weights  Weight (lbs) 237 lb 10.5 oz 242 lb 15.2 oz 242 lb 15.2 oz  Weight (kg) 107.8 kg 110.2 kg 110.2 kg      Telemetry:  Overnight telemetry shows SR, which I personally reviewed.   Physical examination: PHYSICAL EXAM: Vitals:   04/01/23 0750 04/01/23 0755 04/01/23 0830 04/01/23 1014  BP:  (!) 161/47  (!) 149/48  Pulse:  66    Resp: 14 18 18 20   Temp:  98.4 F (36.9 C)    TempSrc:  Oral    SpO2:  98%  96%  Weight:        Physical Exam  Constitutional: No distress.  Age appropriate, hemodynamically stable.   Neck: No JVD present.  Cardiovascular: Normal rate, regular rhythm, S1 normal, S2 normal, intact distal pulses and normal pulses. Exam reveals no gallop, no S3 and no S4.  Murmur heard. Pulses:      Carotid pulses are  on the left side with bruit. Pulmonary/Chest: Effort normal and breath sounds normal. No stridor. He has no wheezes. He has no rales.  Abdominal: Soft.  Bowel sounds are normal. He exhibits no distension. There is no abdominal tenderness.  Musculoskeletal:        General: No edema.     Cervical back: Neck supple.     Comments: HD graft LUE. Bilateral BKA  Neurological: He is alert and oriented to person, place, and time. He has intact cranial nerves (2-12).  Skin: Skin is warm and moist.    Lab Results: Chemistry Recent Labs  Lab 03/30/23 1344 03/30/23 1922 03/31/23 0314  NA 135  --  138  K 4.3  --  3.9  CL 97*  --  95*  CO2 24  --  26  GLUCOSE 171*  --  106*  BUN 38*  --  46*  CREATININE 5.97*  --  6.32*  CALCIUM 8.4*  --  8.6*  PROT  --  6.0* 5.4*  ALBUMIN  --  3.0* 2.7*  AST  --  24 21  ALT  --  27 25  ALKPHOS  --  119 108  BILITOT  --  0.4 0.5  GFRNONAA 10*  --  9*  ANIONGAP 14  --  17*    Hematology Recent Labs  Lab 03/30/23 1344 03/30/23 1922 03/31/23 0314  WBC 8.7  --  7.6  RBC 3.52*  3.52* 3.18*  HGB 10.7*  --  9.8*  HCT 32.5*  --  29.2*  MCV 92.3  --  91.8  MCH 30.4  --  30.8  MCHC 32.9  --  33.6  RDW 13.2  --  13.1  PLT 180  --  166   High Sensitivity Troponin:   Recent Labs  Lab 03/30/23 1344 03/30/23 1652 03/30/23 1922 03/30/23 2307  TROPONINIHS 24* 26* 30* 26*     Cardiac EnzymesNo results for input(s): "TROPONINI" in the last 168 hours. No results for input(s): "TROPIPOC" in the last 168 hours.  BNPNo results for input(s): "BNP", "PROBNP" in the last 168 hours.  DDimer  Recent Labs  Lab 03/31/23 1402  DDIMER 0.42    Hemoglobin A1c:  Lab Results  Component Value Date   HGBA1C 6.1 (H) 03/30/2023   MPG 128.37 03/30/2023   TSH  Recent Labs    03/30/23 1922  TSH 1.211   Lipid Panel  Lab Results  Component Value Date   CHOL 104 03/31/2023   HDL 23 (L) 03/31/2023   LDLCALC 54 03/31/2023   TRIG 135 03/31/2023   CHOLHDL 4.5 03/31/2023   Drugs of Abuse     Component Value Date/Time   LABOPIA NONE DETECTED 07/30/2016 1756   COCAINSCRNUR NONE DETECTED 07/30/2016 1756    LABBENZ NONE DETECTED 07/30/2016 1756   AMPHETMU NONE DETECTED 07/30/2016 1756   THCU NONE DETECTED 07/30/2016 1756   LABBARB NONE DETECTED 07/30/2016 1756      Imaging: ECHOCARDIOGRAM COMPLETE  Result Date: 03/31/2023    ECHOCARDIOGRAM REPORT   Patient Name:   [name] Date of Exam: 03/31/2023 Medical Rec #:  [id number]     Height:       74.0 in Accession #:    [id number]    Weight:       242.9 lb Date of Birth:  12-Jan-1956     BSA:          2.361 m Patient Age:    66 years      BP:           197/106 mmHg Patient Gender: M             HR:           71 bpm. Exam Location:  Inpatient Procedure: 2D Echo, Cardiac Doppler and Color Doppler Indications:    Elevated Troponin; CAD  History:        Patient has prior history of Echocardiogram examinations, most                 recent 06/25/2022. CAD, ESRD, Carotid Disease and PAD,                 Arrythmias:Atrial Fibrillation; Risk Factors:Current Smoker,                 Dyslipidemia, Diabetes, Hypertension and Sleep Apnea.  Sonographer:    Dondra Prader RVT RCS Referring Phys: [id number] ANASTASSIA DOUTOVA IMPRESSIONS  1. Left ventricular ejection fraction, by estimation, is 60 to 65%. The left ventricle has normal function. The left ventricle has no regional wall motion abnormalities. There is mild concentric left ventricular hypertrophy. Left ventricular diastolic parameters are consistent with Grade I diastolic dysfunction (impaired relaxation). Elevated left atrial pressure.  2. Right ventricular systolic function is normal. The right ventricular size is normal.  3. Left atrial size was moderately dilated.  4. The mitral valve is normal in structure. No evidence of mitral valve regurgitation.  No evidence of mitral stenosis. Moderate mitral annular calcification.  5. The aortic valve is tricuspid. Aortic valve regurgitation is not visualized. Aortic valve sclerosis/calcification is present, without any evidence of aortic stenosis.  6. The inferior vena cava is  dilated in size with >50% respiratory variability, suggesting right atrial pressure of 8 mmHg. FINDINGS  Left Ventricle: Left ventricular ejection fraction, by estimation, is 60 to 65%. The left ventricle has normal function. The left ventricle has no regional wall motion abnormalities. The left ventricular internal cavity size was normal in size. There is  mild concentric left ventricular hypertrophy. Left ventricular diastolic parameters are consistent with Grade I diastolic dysfunction (impaired relaxation). Elevated left atrial pressure. Right Ventricle: The right ventricular size is normal. Right ventricular systolic function is normal. Left Atrium: Left atrial size was moderately dilated. Right Atrium: Right atrial size was normal in size. Pericardium: There is no evidence of pericardial effusion. Mitral Valve: The mitral valve is normal in structure. Moderate mitral annular calcification. No evidence of mitral valve regurgitation. No evidence of mitral valve stenosis. MV peak gradient, 6.2 mmHg. The mean mitral valve gradient is 3.0 mmHg. Tricuspid Valve: The tricuspid valve is normal in structure. Tricuspid valve regurgitation is trivial. No evidence of tricuspid stenosis. Aortic Valve: The aortic valve is tricuspid. Aortic valve regurgitation is not visualized. Aortic valve sclerosis/calcification is present, without any evidence of aortic stenosis. Aortic valve mean gradient measures 8.0 mmHg. Aortic valve peak gradient measures 14.7 mmHg. Aortic valve area, by VTI measures 3.15 cm. Pulmonic Valve: The pulmonic valve was normal in structure. Pulmonic valve regurgitation is trivial. No evidence of pulmonic stenosis. Aorta: The aortic root is normal in size and structure. Venous: The inferior vena cava is dilated in size with greater than 50% respiratory variability, suggesting right atrial pressure of 8 mmHg. IAS/Shunts: No atrial level shunt detected by color flow Doppler.  LEFT VENTRICLE PLAX 2D LVIDd:          5.30 cm   Diastology LVIDs:         3.00 cm   LV e' medial:    5.77 cm/s LV PW:         1.20 cm   LV E/e' medial:  21.1 LV IVS:        1.40 cm   LV e' lateral:   7.72 cm/s LVOT diam:     2.20 cm   LV E/e' lateral: 15.8 LV SV:         125 LV SV Index:   53 LVOT Area:     3.80 cm  RIGHT VENTRICLE             IVC RV Basal diam:  4.30 cm     IVC diam: 2.50 cm RV S prime:     13.80 cm/s TAPSE (M-mode): 2.5 cm LEFT ATRIUM              Index        RIGHT ATRIUM           Index LA diam:        5.00 cm  2.12 cm/m   RA Area:     21.90 cm LA Vol (A2C):   126.0 ml 53.37 ml/m  RA Volume:   72.00 ml  30.50 ml/m LA Vol (A4C):   102.7 ml 43.50 ml/m LA Biplane Vol: 121.0 ml 51.25 ml/m  AORTIC VALVE  PULMONIC VALVE AV Area (Vmax):    2.87 cm      PV Vmax:       0.89 m/s AV Area (Vmean):   2.99 cm      PV Peak grad:  3.2 mmHg AV Area (VTI):     3.15 cm AV Vmax:           192.00 cm/s AV Vmean:          127.000 cm/s AV VTI:            0.398 m AV Peak Grad:      14.7 mmHg AV Mean Grad:      8.0 mmHg LVOT Vmax:         145.00 cm/s LVOT Vmean:        100.000 cm/s LVOT VTI:          0.330 m LVOT/AV VTI ratio: 0.83  AORTA Ao Root diam: 3.50 cm Ao Asc diam:  3.60 cm MITRAL VALVE MV Area (PHT): 3.27 cm     SHUNTS MV Area VTI:   3.74 cm     Systemic VTI:  0.33 m MV Peak grad:  6.2 mmHg     Systemic Diam: 2.20 cm MV Mean grad:  3.0 mmHg MV Vmax:       1.25 m/s MV Vmean:      83.0 cm/s MV Decel Time: 232 msec MV E velocity: 122.00 cm/s MV A velocity: 146.00 cm/s MV E/A ratio:  0.84 Olga Millers MD Electronically signed by Olga Millers MD Signature Date/Time: 03/31/2023/11:20:07 AM    Final    DG Chest 2 View  Result Date: 03/30/2023 CLINICAL DATA:  Left-sided chest pain with shortness of breath for 3 days. Dialysis patient. EXAM: CHEST - 2 VIEW COMPARISON:  Radiographs 11/19/2022 and 06/23/2022. Chest CT 06/23/2022. FINDINGS: The heart size and mediastinal contours are stable. There is aortic  atherosclerosis. Interval improved aeration of both lungs without evidence of focal airspace disease, edema, pleural effusion or pneumothorax. Probable mild chronic central airway thickening. Previous lower cervical fusion noted. No acute osseous findings are seen. IMPRESSION: No evidence of acute cardiopulmonary process. Interval improved aeration of both lungs. Electronically Signed   By: Carey Bullocks M.D.   On: 03/30/2023 14:16    CARDIAC DATABASE: EKG: March 30, 2023: Sinus rhythm, first-degree AV block, 73 bpm, right bundle branch block, left anterior fascicular block, LVH per voltage criteria.   March 31, 2023: Sinus rhythm with first-degree AV block, 69 bpm, RBBB, left anterior fascicular block.   Echocardiogram: 03/31/2023  1. Left ventricular ejection fraction, by estimation, is 60 to 65%. The  left ventricle has normal function. The left ventricle has no regional  wall motion abnormalities. There is mild concentric left ventricular  hypertrophy. Left ventricular diastolic  parameters are consistent with Grade I diastolic dysfunction (impaired  relaxation). Elevated left atrial pressure.   2. Right ventricular systolic function is normal. The right ventricular  size is normal.   3. Left atrial size was moderately dilated.   4. The mitral valve is normal in structure. No evidence of mitral valve  regurgitation. No evidence of mitral stenosis. Moderate mitral annular  calcification.   5. The aortic valve is tricuspid. Aortic valve regurgitation is not  visualized. Aortic valve sclerosis/calcification is present, without any  evidence of aortic stenosis.   6. The inferior vena cava is dilated in size with >50% respiratory  variability, suggesting right atrial pressure of 8 mmHg.    Heart Catheterization: Coronary  angiogram 09/17/18: Widely patent stent stents. Normal LVEF.    Coronary Angiogram 10/29/2014: CAD s/p stenting of the distal, mid and proximal RCA with implantation of 3  overlapping drug-eluting stent, from distal to proximal 2.5 x 38 mm, 2.75 x 38 mm and a 2.75 x 20 mm Promus premier DES.     Carotid artery duplex  06/09/2019:  Minimal stenosis in bilateral ICA of 1-15%. Heterogeneous plaque. Stenosis  in the right external carotid artery (<50%).  Left carotid endarterectomy site is patent.  Right vertebral artery flow is not well visualized. Antegrade left  vertebral artery flow.  No significant change from 03/01/2017. Follow up studies when clinically  Indicated   Scheduled Meds:  amiodarone  100 mg Oral Daily   amLODipine  10 mg Oral Daily   apixaban  5 mg Oral BID   aspirin EC  81 mg Oral Daily   atorvastatin  40 mg Oral QHS   Chlorhexidine Gluconate Cloth  6 each Topical Q0600   famotidine  20 mg Oral QODAY   fentaNYL  1 patch Transdermal Q72H   folic acid  1 mg Oral Daily   hydrALAZINE  50 mg Oral Q8H   insulin aspart  0-6 Units Subcutaneous Q4H   insulin glargine-yfgn  10 Units Subcutaneous QHS   isosorbide mononitrate  60 mg Oral Daily   labetalol  200 mg Oral BID   loratadine  10 mg Oral Daily   mupirocin ointment  1 Application Nasal BID   pantoprazole  40 mg Oral Daily   pregabalin  100 mg Oral BID   rOPINIRole  2 mg Oral QHS   sodium chloride flush  3 mL Intravenous Q12H   tamsulosin  0.4 mg Oral Daily    Continuous Infusions:  sodium chloride     nitroGLYCERIN Stopped (03/31/23 1633)    PRN Meds: sodium chloride, acetaminophen **OR** acetaminophen, metoCLOPramide, oxyCODONE-acetaminophen, sodium chloride flush   IMPRESSION & RECOMMENDATIONS: [name] is a 67 y.o. Caucasian male whose past medical history and cardiac risk factors include: Severe peripheral artery disease with prior PVI, status post bilateral BKA, uncontrolled diabetes, end-stage renal disease, cardiomyopathy, coronary disease status history of coronary interventions, right carotid endarterectomy in 2012, OSA unable to tolerate CPAP, chronic back and neck  pain, paroxysmal atrial fibrillation.   Impression: Precordial pain. Established coronary disease with prior coronary interventions. Hypertensive crisis on arrival with SBP >200 mmHg. Known PAD status post PVI and bilateral BKA. Cigarette smoking. History of right carotid endarterectomy. OSA not on CPAP. Paroxysmal atrial fibrillation. Long-term oral anticoagulation ESRD.  Recommendations: Patient's precordial discomfort on arrival was not classic of cardiac discomfort.  High sensitive troponin slightly elevated but essentially flat.  EKG illustrated sinus rhythm without underlying ischemia or injury pattern.  Echocardiogram noted preserved LVEF without regional wall motion abnormalities.  His precordial discomfort on arrival likely secondary to hypertensive crisis.  Shared decision was to uptitrate antihypertensive medications and reevaluate.  With uptitration of blood pressure medications his SBP's have significantly improved and his pain is now resolved.  Yesterday, patient chose to hold off on ischemic workup (including stress testing and left heart catheterization) and have it reevaluated once blood pressures are better controlled.  He continues to feel the same which is reasonable.   Continue hydralazine 50 mg p.o. 3 times daily.  Continue labetalol 200 mg p.o. twice daily.  Continue remaining home blood pressure medications.  Reemphasized importance of secondary prevention and improving him modifiable risk factors.  Outpatient follow-up scheduled  with Dr. Jacinto Halim.  Discharge paperwork updated.  Patient's questions and concerns were addressed to his satisfaction. He voices understanding of the instructions provided during this encounter.   This note was created using a voice recognition software as a result there may be grammatical errors inadvertently enclosed that do not reflect the nature of this encounter. Every attempt is made to correct such errors.  Delilah Shan  [facility]  Pager:  [phone] Office: [phone] 04/01/2023, 11:34 AM       Addendum:  Dr. Caleb Popp requested that I call his wife to answer her questions.  Called Dolby,[name] Delores (Spouse) at [phone] [facility]) answered her questions regarding his presentation and plan of care. She wanted to know the medication changes. She will be his advocate at this SNF and bring him to his follow up appt or sooner if needed.   Brookley Spitler [place], DO, FACC 7:00 PM 04/01/23

## 2023-04-01 NOTE — Consult Note (Signed)
   [facility] CM Inpatient Consult   04/01/2023  FERGUSON GERTNER 1956/03/09 [id number]  [facility] [[facility]]  Accountable Care Organization [ACO] Patient: [name]  Primary Care Provider:  Lonie Peak, PA-C with [facility]   Patient was reviewed for observation stay and assess for post hospital barriers to care  Patient was screened for hospitalization patient admitted from [facility] SNF noted.     Met with patient at the bedside as patient was previously active with a community [employer] and was trying to follow up. Patient endorses he is returning to [facility].   However, this is not a [facility] affiliated facility.  Plan:   Will update Community RN CC of disposition and follow up in [place]/PING for disposition.  For questions or referrals, please contact:   Charlesetta Shanks, RN BSN CCM [facility] Liaison  [phone] business mobile phone Toll free office [phone]  *Concierge Line  [phone] Fax number: [phone] [email or web address]

## 2023-04-01 NOTE — Final Progress Note (Signed)
[facility] Patient Discharge Orders- [facility] CLINIC: [id number]  Patient's name: QUINTAVIOUS RINCK Admit/DC Dates: 03/30/2023 -   Discharge Diagnoses: Chest pain 2/2 HTN  emergency per Card consult    Htn  Emergency = missed hd  and Card added Hydralazine and Labetalol   Aranesp: Given: no   Date and amount of last dose: no  Last Hgb: 9.8 < 10.7  fu trend  PRBC's Given: 0 Date/# of units: 0 ESA dose for discharge: mircera 0 mcg IV q 2 weeks  IV Iron dose at discharge: 0  Heparin change: no  EDW Change: yes New EDW:  108.5  ( but bed wt)  Bath Change: no  Access intervention/Change: no Details:  Hectorol/Calcitriol change: No  Discharge Labs: Calcium8.6 Phosphorus 5.8 Albumin 2.7 K+ 3.9  IV Antibiotics: no Details:  On Coumadin?: no Last INR: Next INR: Managed By:   OTHER/APPTS/LAB ORDERS: Cards ( Dr Odis Hollingshead , Dr  Jacinto Halim group )    D/C Meds to be reconciled by nurse after every discharge.  Completed By:   Reviewed by: MD:______ RN_______

## 2023-04-01 NOTE — Progress Notes (Signed)
Received patient in bed to unit.  Alert and oriented.  Informed consent signed and in chart.   TX duration: 4 hours  Patient tolerated well.  Transported back to the room  Alert, without acute distress.  Hand-off given to patient's nurse.   Access used: fistula Access issues: none  Total UF removed: 4000 mls Medication(s) given: none Post HD VS: 122/91 Post HD weight: 107.8 kg     04/01/23 0609  Vitals  Temp 98.5 F (36.9 C)  Temp Source Oral  BP (!) 122/54  MAP (mmHg) 74  BP Location Right Arm  BP Method Automatic  Patient Position (if appropriate) Lying  Pulse Rate Source Monitor  ECG Heart Rate 60  Resp 18  Oxygen Therapy  SpO2 99 %  O2 Device Room Air  Patient Activity (if Appropriate) In bed  Pulse Oximetry Type Continuous  Post Treatment  Dialyzer Clearance Lightly streaked  Duration of HD Treatment -hour(s) 4 hour(s)  Hemodialysis Intake (mL) 0 mL  Liters Processed 96  Fluid Removed (mL) 4000 mL  Tolerated HD Treatment Yes  Post-Hemodialysis Comments HD tx completed as scheduled, tolerated well, no acute distress, pt is stable.  AVG/AVF Arterial Site Held (minutes) 10 minutes  AVG/AVF Venous Site Held (minutes) 10 minutes  Note  Patient Observations pt is in bed resting.  Fistula / Graft Left Upper arm Arteriovenous fistula  Placement Date/Time: 07/21/20 0825   Placed prior to admission: No  Orientation: Left  Access Location: Upper arm  Access Type: Arteriovenous fistula  Site Condition No complications  Fistula / Graft Assessment Present;Thrill;Bruit  Status Deaccessed  Drainage Description None

## 2023-04-02 ENCOUNTER — Emergency Department (HOSPITAL_COMMUNITY): Payer: Medicare HMO

## 2023-04-02 ENCOUNTER — Encounter (HOSPITAL_COMMUNITY): Payer: Self-pay | Admitting: Emergency Medicine

## 2023-04-02 ENCOUNTER — Other Ambulatory Visit: Payer: Self-pay

## 2023-04-02 ENCOUNTER — Emergency Department (HOSPITAL_COMMUNITY)
Admission: EM | Admit: 2023-04-02 | Discharge: 2023-04-02 | Disposition: A | Discharge status: Home / Self Care | Payer: Medicare HMO | Attending: Emergency Medicine | Admitting: Emergency Medicine

## 2023-04-02 DIAGNOSIS — Z7901 Long term (current) use of anticoagulants: Secondary | ICD-10-CM | POA: Diagnosis not present

## 2023-04-02 DIAGNOSIS — R9082 White matter disease, unspecified: Secondary | ICD-10-CM | POA: Insufficient documentation

## 2023-04-02 DIAGNOSIS — I10 Essential (primary) hypertension: Secondary | ICD-10-CM | POA: Diagnosis not present

## 2023-04-02 DIAGNOSIS — E1122 Type 2 diabetes mellitus with diabetic chronic kidney disease: Secondary | ICD-10-CM | POA: Insufficient documentation

## 2023-04-02 DIAGNOSIS — Z794 Long term (current) use of insulin: Secondary | ICD-10-CM | POA: Insufficient documentation

## 2023-04-02 DIAGNOSIS — R7989 Other specified abnormal findings of blood chemistry: Secondary | ICD-10-CM | POA: Diagnosis not present

## 2023-04-02 DIAGNOSIS — Z79899 Other long term (current) drug therapy: Secondary | ICD-10-CM | POA: Diagnosis not present

## 2023-04-02 DIAGNOSIS — Z7401 Bed confinement status: Secondary | ICD-10-CM | POA: Diagnosis not present

## 2023-04-02 DIAGNOSIS — N186 End stage renal disease: Secondary | ICD-10-CM | POA: Diagnosis not present

## 2023-04-02 DIAGNOSIS — Z91040 Latex allergy status: Secondary | ICD-10-CM | POA: Insufficient documentation

## 2023-04-02 DIAGNOSIS — Z992 Dependence on renal dialysis: Secondary | ICD-10-CM | POA: Diagnosis not present

## 2023-04-02 DIAGNOSIS — I6782 Cerebral ischemia: Secondary | ICD-10-CM | POA: Diagnosis not present

## 2023-04-02 DIAGNOSIS — T7840XA Allergy, unspecified, initial encounter: Secondary | ICD-10-CM | POA: Diagnosis not present

## 2023-04-02 DIAGNOSIS — R519 Headache, unspecified: Secondary | ICD-10-CM | POA: Diagnosis not present

## 2023-04-02 DIAGNOSIS — I959 Hypotension, unspecified: Secondary | ICD-10-CM | POA: Diagnosis not present

## 2023-04-02 DIAGNOSIS — R079 Chest pain, unspecified: Secondary | ICD-10-CM | POA: Diagnosis not present

## 2023-04-02 DIAGNOSIS — R072 Precordial pain: Secondary | ICD-10-CM | POA: Insufficient documentation

## 2023-04-02 DIAGNOSIS — Z743 Need for continuous supervision: Secondary | ICD-10-CM | POA: Diagnosis not present

## 2023-04-02 DIAGNOSIS — I120 Hypertensive chronic kidney disease with stage 5 chronic kidney disease or end stage renal disease: Secondary | ICD-10-CM | POA: Diagnosis not present

## 2023-04-02 DIAGNOSIS — D509 Iron deficiency anemia, unspecified: Secondary | ICD-10-CM | POA: Diagnosis not present

## 2023-04-02 DIAGNOSIS — E875 Hyperkalemia: Secondary | ICD-10-CM | POA: Diagnosis not present

## 2023-04-02 DIAGNOSIS — R739 Hyperglycemia, unspecified: Secondary | ICD-10-CM | POA: Diagnosis not present

## 2023-04-02 DIAGNOSIS — G9389 Other specified disorders of brain: Secondary | ICD-10-CM | POA: Diagnosis not present

## 2023-04-02 DIAGNOSIS — R0789 Other chest pain: Secondary | ICD-10-CM | POA: Diagnosis not present

## 2023-04-02 DIAGNOSIS — R071 Chest pain on breathing: Secondary | ICD-10-CM | POA: Diagnosis not present

## 2023-04-02 DIAGNOSIS — N2581 Secondary hyperparathyroidism of renal origin: Secondary | ICD-10-CM | POA: Diagnosis not present

## 2023-04-02 DIAGNOSIS — D631 Anemia in chronic kidney disease: Secondary | ICD-10-CM | POA: Diagnosis not present

## 2023-04-02 LAB — CBC
HCT: 30.6 % — ABNORMAL LOW (ref 39.0–52.0)
Hemoglobin: 10.2 g/dL — ABNORMAL LOW (ref 13.0–17.0)
MCH: 31.8 pg (ref 26.0–34.0)
MCHC: 33.3 g/dL (ref 30.0–36.0)
MCV: 95.3 fL (ref 80.0–100.0)
Platelets: 174 10*3/uL (ref 150–400)
RBC: 3.21 MIL/uL — ABNORMAL LOW (ref 4.22–5.81)
RDW: 13.3 % (ref 11.5–15.5)
WBC: 8.5 10*3/uL (ref 4.0–10.5)
nRBC: 0 % (ref 0.0–0.2)

## 2023-04-02 LAB — TROPONIN I (HIGH SENSITIVITY)
Troponin I (High Sensitivity): 18 ng/L — ABNORMAL HIGH (ref <18)
Troponin I (High Sensitivity): 19 ng/L — ABNORMAL HIGH (ref <18)

## 2023-04-02 LAB — BASIC METABOLIC PANEL
Anion gap: 14 (ref 5–15)
BUN: 26 mg/dL — ABNORMAL HIGH (ref 8–23)
CO2: 27 mmol/L (ref 22–32)
Calcium: 8 mg/dL — ABNORMAL LOW (ref 8.9–10.3)
Chloride: 95 mmol/L — ABNORMAL LOW (ref 98–111)
Creatinine, Ser: 3.95 mg/dL — ABNORMAL HIGH (ref 0.61–1.24)
GFR, Estimated: 16 mL/min — ABNORMAL LOW (ref >60)
Glucose, Bld: 183 mg/dL — ABNORMAL HIGH (ref 70–99)
Potassium: 3.6 mmol/L (ref 3.5–5.1)
Sodium: 136 mmol/L (ref 135–145)

## 2023-04-02 LAB — LIPOPROTEIN A (LPA): Lipoprotein (a): 226.9 nmol/L — ABNORMAL HIGH (ref <75.0)

## 2023-04-02 LAB — HEPATITIS B SURFACE ANTIBODY, QUANTITATIVE: Hep B S AB Quant (Post): 37.2 m[IU]/mL

## 2023-04-02 MED ORDER — MORPHINE SULFATE (PF) 2 MG/ML IV SOLN
2.0000 mg | Freq: Once | INTRAVENOUS | Status: AC
  Administered 2023-04-02: 2 mg via INTRAVENOUS
  Filled 2023-04-02: qty 1

## 2023-04-02 NOTE — Discharge Instructions (Signed)
It was a pleasure taking care of you today!   Your workup was negative in the ED. You may apply ice or heat to the affected area for 15 minutes at a time.  Ensure to place a barrier between your skin and and the ice.  Continue to take your medications as prescribed. Call your cardiologist to set up a follow up appointment regarding todays ED visit. Follow-up with your primary care provider for evaluation of your symptoms. If you do not have a primary care provider, you may follow-up with the [facility] department as needed.  You may return to the ED if you are experiencing increasing/worsening chest pain, shortness of breath, or worsening symptoms.

## 2023-04-02 NOTE — ED Provider Triage Note (Signed)
Emergency Medicine Provider Triage Evaluation Note  [name] , a 67 y.o. male  was evaluated in triage.  Pt complains of concerns for left chest wall pain onset 1 hour PTA. Notes that he wasn't able to get his full dialysis session today and was only able to complete 1 hour of his dialysis session. Notes that his pain is worse with inspiration and movement. History of 3 stents placed and caths in the past.  His cardiologist is Dr. Gwynneth Macleod.  Has a history of diabetes and high blood pressure and is compliant with his medications.  Denies shortness of breath, nausea, vomiting. Denies history of MI.   Review of Systems  Positive:  Negative:   Physical Exam  BP (!) 120/57   Pulse 79   Temp 98.7 F (37.1 C) (Oral)   Resp 19   Ht 6\' 2"  (1.88 m)   Wt 107 kg   SpO2 98%   BMI 30.29 kg/m  Gen:   Awake, no distress   Resp:  Normal effort  MSK:   Moves extremities without difficulty  Other:  No chest wall TTP. No overlying skin changes or palpable deformities. Thrill noted to left AV fistula.   Medical Decision Making  Medically screening exam initiated at 2:18 PM.  Appropriate orders placed.  [name] was informed that the remainder of the evaluation will be completed by another provider, this initial triage assessment does not replace that evaluation, and the importance of remaining in the ED until their evaluation is complete.  2:19 PM - Discussed with RN that patient is in need of a room immediately. RN aware and working on room placement.    Joram Venson A, PA-C 04/02/23 1421

## 2023-04-02 NOTE — ED Notes (Signed)
Ptar called, 2nd in line

## 2023-04-02 NOTE — Progress Notes (Signed)
ON-CALL CARDIOLOGY 04/02/23  Patient's name: [name].   MRN: [id number].    DOB: 1956-05-05 Primary care provider: Lonie Peak, PA-C. Primary cardiologist: Dr. Yates Decamp  Requesting provider: Karenann Cai, PA-C  Interaction regarding this patient's care today: Discharged from the hospital back to SNF on 04/01/2023   Presents today from his dialysis after being treated for 1.5 hours for chest pain.  His chest pain is not suggestive of anginal discomfort.  High sensitive troponins are slightly elevated but essentially flat, EKG not consistent with ACS, recently had an echocardiogram less than a week ago.   Impression:   ICD-10-CM   1. Precordial pain  R07.2       Meds ordered this encounter  Medications   morphine (PF) 2 MG/ML injection 2 mg    Orders Placed This Encounter  Procedures   DG Chest 1 View   CT Head Wo Contrast   Basic metabolic panel   CBC   ED Cardiac monitoring   Inpatient consult to Cardiology  [facility] Cardiology   Consult to nephrology   EKG 12-Lead   ED EKG   Insert peripheral IV    Recommendations: Since he presents again to the hospital for chest pain recommended either hospital admission with cardiology consult for ischemic evaluation versus outpatient evaluation.  I was informed by ED the patient prefers outpatient evaluation.  Informed Dr. Jacinto Halim, his primary cardiologist, and plan is to consider setting him up for elective left heart catheterization with possible PCI.  Telephone encounter total time: 10 minutes  Delilah Shan [facility]  Pager:  [phone] Office: [phone]

## 2023-04-02 NOTE — ED Provider Notes (Signed)
EMERGENCY DEPARTMENT AT [facility] Provider Note   CSN: [id number] Arrival date & time: 04/02/23  1408     History  Chief Complaint  Patient presents with   Chest Pain    [name] is a 67 y.o. male with a PMHx of DM, HTN, COPD, ESRD (dialysis TThSa), who presents to the ED with concerns for left chest wall pain onset 1 hour PTA. Notes that he wasn't able to get his full dialysis session today and was only able to complete 1 hour of his dialysis session. Notes that his pain is worse with inspiration and movement. History of 3 stents placed and caths in the past.  His cardiologist is Dr. Jacinto Halim.  Has a history of diabetes and high blood pressure and is compliant with his medications.  Denies shortness of breath, nausea, vomiting. Denies history of MI.    Per pt chart review: Pt was discharged from admission yesterday. Pt was offered continued interventions of a LHC, but patient declined yesterday.    The history is provided by the patient. No language interpreter was used.       Home Medications Prior to Admission medications   Medication Sig Start Date End Date Taking? Authorizing Provider  amiodarone (PACERONE) 200 MG tablet Take 1 tablet (200 mg total) by mouth daily. Patient taking differently: Take 100 mg by mouth daily. 10/29/22   Yates Decamp, MD  amLODipine (NORVASC) 10 MG tablet Take 1 tablet (10 mg total) by mouth daily. 01/21/23   Yates Decamp, MD  apixaban (ELIQUIS) 5 MG TABS tablet Take 1 tablet (5 mg total) by mouth 2 (two) times daily. 01/14/22 03/27/23  Almon Hercules, MD  atorvastatin (LIPITOR) 40 MG tablet Take 40 mg by mouth at bedtime.     [provider]  B Complex-C-Zn-Folic Acid (DIALYVITE/ZINC) TABS Take 1 tablet by mouth Every Tuesday,Thursday,and Saturday with dialysis. 10/04/21   [provider]  famotidine (PEPCID) 20 MG tablet Take 20 mg by mouth every other day.    [provider]  fentaNYL (DURAGESIC) 50 MCG/HR  Place 1 patch onto the skin every 3 (three) days. 04/01/23   [name], MD  folic acid (FOLVITE) 1 MG tablet Take 1 mg by mouth daily. 09/18/21   [provider]  Glucosamine HCl (GLUCOSAMINE PO) Take 500 mg by mouth 2 (two) times daily.    [provider]  hydrALAZINE (APRESOLINE) 50 MG tablet Take 1 tablet (50 mg total) by mouth every 8 (eight) hours. 04/01/23   [name], MD  insulin aspart (NOVOLOG FLEXPEN) 100 UNIT/ML FlexPen Inject 0-12 Units into the skin See admin instructions. Inject as per sliding scale: If 0 - 69 = 0 initiate hypoglycemia orders;  70 - 150 = 0;  151 - 200 = 2;  201 - 250 = 4;  251 - 300 = 6;  301 - 350 = 8;  351 - 400 = 10;  401 - 450 = 12.  If >400, give 12 units, recheck in 1 hour. If still >400, call MD/NP for further orders. Subcutaneously with meals in addition to scheduled 4 unites with meals.    [provider]  insulin glargine (LANTUS) 100 UNIT/ML injection Inject 0.2 mLs (20 Units total) into the skin at bedtime. 06/28/22   Zigmund Daniel., MD  isosorbide mononitrate (IMDUR) 60 MG 24 hr tablet Take 1 tablet (60 mg total) by mouth daily. 01/21/23 04/21/23  Yates Decamp, MD  labetalol (  NORMODYNE) 200 MG tablet Take 1 tablet (200 mg total) by mouth 2 (two) times daily. 04/01/23   [name], MD  lidocaine-prilocaine (EMLA) cream Apply 1 application topically See admin instructions. Prior to Dialysis days Tuesday,Thursday and saturday 06/06/21   [provider]  loratadine (CLARITIN) 10 MG tablet Take 10 mg by mouth daily.    [provider]  metoCLOPramide (REGLAN) 5 MG tablet Take 1 tablet (5 mg total) by mouth 4 (four) times daily -  before meals and at bedtime. Patient taking differently: Take 5 mg by mouth in the morning, at noon, in the evening, and at bedtime. 01/14/22 03/27/23  Almon Hercules, MD  NEEDLE, REUSABLE, 22 G 22G X 1-1/2" MISC 1 Units by Does not apply route as directed. For B12 IM inj  02/15/18   Elgergawy, Leana Roe, MD  nitroGLYCERIN (NITROSTAT) 0.4 MG SL tablet Place 0.4 mg under the tongue every 5 (five) minutes as needed for chest pain. 01/06/22   [provider]  omega-3 acid ethyl esters (LOVAZA) 1 g capsule Take 2 g by mouth 2 (two) times daily.    [provider]  pantoprazole (PROTONIX) 40 MG tablet Take 1 tablet (40 mg total) by mouth 2 (two) times daily for 30 days, THEN 1 tablet (40 mg total) daily. Patient taking differently:  1 tablet (40 mg total) twice daily. 01/14/22 03/27/23  Almon Hercules, MD  Polyvinyl Alcohol-Povidone PF 1.4-0.6 % SOLN Place 1 drop into both eyes 2 (two) times daily as needed (dry eyes).     [provider]  pregabalin (LYRICA) 75 MG capsule Take 1 capsule (75 mg total) by mouth daily for 5 days. 04/01/23 04/06/23  [name], MD  rOPINIRole (REQUIP) 2 MG tablet Take 2 mg by mouth at bedtime.    [provider]  senna-docusate (SENOKOT-S) 8.6-50 MG tablet Take 2 tablets by mouth at bedtime.    [provider]  Syringe/Needle, Disp, (SYRINGE 3CC/22GX1-1/2") 22G X 1-1/2" 3 ML MISC 1 Syringe by Does not apply route as directed. For b12 IM inj 02/15/18   Elgergawy, Leana Roe, MD  tamsulosin (FLOMAX) 0.4 MG CAPS capsule Take 0.4 mg by mouth daily.    [provider]      Allergies    Contrast media [iodinated contrast media], Ivp dye [iodinated contrast media], Adhesive [tape], and Latex    Review of Systems   Review of Systems  Cardiovascular:  Positive for chest pain.  All other systems reviewed and are negative.   Physical Exam Updated Vital Signs BP (!) 120/57   Pulse 79   Temp 98.7 F (37.1 C) (Oral)   Resp 19   Ht 6\' 2"  (1.88 m)   Wt 107 kg   SpO2 98%   BMI 30.29 kg/m  Physical Exam Vitals and nursing note reviewed.  Constitutional:      General: He is not in acute distress.    Appearance: Normal appearance.  Eyes:     General: No scleral icterus.    Extraocular  Movements: Extraocular movements intact.  Cardiovascular:     Rate and Rhythm: Normal rate.  Pulmonary:     Effort: Pulmonary effort is normal. No respiratory distress.  Chest:     Comments: No appreciable chest wall TTP Abdominal:     Palpations: Abdomen is soft. There is no mass.     Tenderness: There is no abdominal tenderness.  Musculoskeletal:        General: Normal range  of motion.     Cervical back: Neck supple.     Comments: Bilateral BKA  Skin:    General: Skin is warm and dry.     Findings: No rash.  Neurological:     Mental Status: He is alert.     Sensory: Sensation is intact.     Motor: Motor function is intact.  Psychiatric:        Behavior: Behavior normal.     ED Results / Procedures / Treatments   Labs (all labs ordered are listed, but only abnormal results are displayed) Labs Reviewed  BASIC METABOLIC PANEL - Abnormal; Notable for the following components:      Result Value   Chloride 95 (*)    Glucose, Bld 183 (*)    BUN 26 (*)    Creatinine, Ser 3.95 (*)    Calcium 8.0 (*)    GFR, Estimated 16 (*)    All other components within normal limits  CBC - Abnormal; Notable for the following components:   RBC 3.21 (*)    Hemoglobin 10.2 (*)    HCT 30.6 (*)    All other components within normal limits  TROPONIN I (HIGH SENSITIVITY) - Abnormal; Notable for the following components:   Troponin I (High Sensitivity) 18 (*)    All other components within normal limits  TROPONIN I (HIGH SENSITIVITY) - Abnormal; Notable for the following components:   Troponin I (High Sensitivity) 19 (*)    All other components within normal limits    EKG EKG Interpretation Date/Time:  Tuesday April 02 2023 14:15:20 EDT Ventricular Rate:  81 PR Interval:  216 QRS Duration:  144 QT Interval:  466 QTC Calculation: 541 R Axis:   -66  Text Interpretation: Sinus rhythm with 1st degree A-V block Right bundle branch block Left anterior fascicular block Minimal voltage  criteria for LVH, may be normal variant ( R in aVL ) Abnormal ECG Confirmed by Gloris Manchester [phone]) on 04/02/2023 4:54:34 PM  Radiology CT Head Wo Contrast  Result Date: 04/02/2023 CLINICAL DATA:  Headache. EXAM: CT HEAD WITHOUT CONTRAST TECHNIQUE: Contiguous axial images were obtained from the base of the skull through the vertex without intravenous contrast. RADIATION DOSE REDUCTION: This exam was performed according to the departmental dose-optimization program which includes automated exposure control, adjustment of the mA and/or kV according to patient size and/or use of iterative reconstruction technique. COMPARISON:  None Available. FINDINGS: Brain: There is mild cerebral atrophy with widening of the extra-axial spaces and ventricular dilatation. There are areas of decreased attenuation within the white matter tracts of the supratentorial brain, consistent with microvascular disease changes. This is most prominent within the right posterior parietal region, near the vertex. Vascular: No hyperdense vessel or unexpected calcification. Skull: Normal. Negative for fracture or focal lesion. Sinuses/Orbits: No acute finding. Other: None. IMPRESSION: 1. Generalized cerebral atrophy with chronic white matter small vessel ischemic changes. 2. No acute intracranial abnormality. Electronically Signed   By: Aram Candela M.D.   On: 04/02/2023 21:07   DG Chest 1 View  Result Date: 04/02/2023 CLINICAL DATA:  Chest pain EXAM: CHEST  1 VIEW COMPARISON:  Chest x-ray 03/30/2023 FINDINGS: There is no focal lung infiltrate, pleural effusion or pneumothorax. The cardiomediastinal silhouette is within normal limits. Questionable faint small nodular density seen in the right upper lobe. No acute fractures are identified. IMPRESSION: 1. No acute cardiopulmonary process. 2. Questionable faint small nodular density in the right upper lobe. Follow-up nonemergent chest CT is recommended.  Electronically Signed   By: Darliss Cheney M.D.   On: 04/02/2023 15:22    Procedures Procedures    Medications Ordered in ED Medications  morphine (PF) 2 MG/ML injection 2 mg (2 mg Intravenous Given 04/02/23 1857)    ED Course/ Medical Decision Making/ A&P Clinical Course as of 04/02/23 2117  Tue Apr 02, 2023  1916 Pt re-evaluated and noted improvement of his symptoms with treatment regimen in the ED. discussed with patient and wife Lawson Fiscal at bedside regarding plans for lab and imaging and consult with specialist. [SB]  2001 Consult to cardiologist, Dr. Odis Hollingshead who reviewed patient lab and imaging studies.  Cardiologist notes this is likely precordial pain and low suspicion at this time for ACS.  Cardiologist notes that patient can either follow-up in the outpatient setting with Dr. Jacinto Halim or if patient wants to be admitted then he will be available for consult. [SB]  2012 Consult to nephrology, Dr. Signe Colt who notes that patient was nauseous, sweaty and clammy prior to his dialysis session today.  Dr. Signe Colt notes that she initiated patient's dispo to the emergency department for further evaluation of her symptoms.  Dr. Signe Colt notes that patient does not need emergent hemodialysis tonight or tomorrow and can follow-up on his schedule on Thursday. [SB]  2044 Discussed with patient and wife Lawson Fiscal via phone call regarding recommendations for either discharge and outpatient follow up with Cardiologist or admission to the hospital and consult in the hospital. Patient and wife opt for outpatient management with his cardiologist, Dr. Jacinto Halim.  [SB]  2112 Discussed with patient CT scan findings and discharge treatment plan. Pt appreciative. Pt appears safe for discharge at this time back to facility.  [SB]  2116 Called patient wife Lawson Fiscal who confirmed HIPAA compliant patient information. Lawson Fiscal confirmed two identifiers for patient.  Discussed with patient's wife Lawson Fiscal regarding CT scan findings as well as discharge treatment plan.  Patient's wife Lawson Fiscal  appreciative at this time.  Answered all available questions.   [SB]    Clinical Course User Index [SB] Caedin Mogan A, PA-C                             Medical Decision Making Amount and/or Complexity of Data Reviewed Labs: ordered. Radiology: ordered.  Risk Prescription drug management.   Patient with a PMHx of DM, HTN, COPD, ESRD (dialysis TThSa),  who presents to the ED with chest pain x 1 hour PTA.  No prior history of MI. Multiple previous caths and stents. Vital signs stable, patient not hypoxic or tachycardic. On exam patient with no chest wall TTP. No overlying skin changes or palpable deformities. Thrill noted to left AV fistula.  without acute cardiovascular, respiratory, abdominal findings. Differential diagnosis includes ACS, PNA, pneumothorax.      Additional history obtained:  External records from outside source obtained and reviewed including: Pt was discharged from admission yesterday. Pt was offered continued interventions of a LHC, but patient declined yesterday.    Labs:  I ordered, and personally interpreted labs.  The pertinent results include:   Initial troponin at 18, delta troponin at 19 CBC CBC without leukocytosis, hemoglobin at 10.2 improved from previous value otherwise unremarkable BMP with slightly elevated creatinine at 3.95, BUN elevated at 26, GFR decreased to 16, improved from previous value  Imaging: I ordered imaging studies including CXR, CT head I independently visualized and interpreted imaging which showed:  1. No acute cardiopulmonary process.  2. Questionable faint small nodular density in the right upper lobe.  Follow-up nonemergent chest CT is recommended.  CT head with  1. Generalized cerebral atrophy with chronic white matter small  vessel ischemic changes.  2. No acute intracranial abnormality.   I agree with the radiologist interpretation  Medications:  I ordered medication including morphine for symptom  management Reevaluation of the patient after these medicines and interventions, I reevaluated the patient and found that they have improved I have reviewed the patients home medicines and have made adjustments as needed  Consultations: I requested consultation with the Cardiologist, Dr. Odis Hollingshead and discussed lab and imaging findings as well as pertinent plan - they recommend: Patient can either follow-up in the outpatient setting with cardiologist Dr. Jacinto Halim or if patient feels that they need to be admitted cardiologist will be available for consult -Consultation with nephrologist, Dr. Signe Colt who notes that patient does not need an emergent hemodialysis session tonight or tomorrow and patient can follow-up with her schedule on Thursday     Disposition: Presented suspicious for chest wall pain likely in the setting of precordial pain. Troponins flat, chest x-ray negative, low suspicion for ACS at this time. Pt symptoms improved with treatment regimen in the ED. Chest x-ray without acute findings, vital signs stable, doubt aortic dissection, pneumonia, or pneumothorax at this time.  Patient reevaluated prior to consult and vital signs stable, no acute distress, patient resting comfortably on stretcher. After consideration of the diagnostic results and the patients response to treatment, I feel that the patient would benefit from Discharge home.  Discussed with patient and wife at bedside regarding importance of patient following up with his cardiologist Dr. Gwynneth Macleod closely regarding today's ED visit.  Also discussed with patient and wife that he will proceed with his dialysis treatments as scheduled on Thursday.  Supportive care measures and strict turn precautions discussed with patient and wife at bedside regarding plans for discharge.  Also instructed patient to follow-up with cardiologist closely regarding today's ED visit.  Answered all available questions.  Patient for safe discharge at this time.   This  chart was dictated using voice recognition software, Dragon. Despite the best efforts of this provider to proofread and correct errors, errors may still occur which can change documentation meaning.   Final Clinical Impression(s) / ED Diagnoses Final diagnoses:  Precordial pain    Rx / DC Orders ED Discharge Orders     None         Armistead Sult A, PA-C 04/02/23 2118    Gloris Manchester, MD 04/08/23 2224

## 2023-04-02 NOTE — ED Triage Notes (Signed)
Per [facility] pt coming from dialysis, received 1.5 hour of his 4 hour treatment. Diaylsis staff state patient was flushed and drowsy- began c/o chest pain. Patient was admitted recently for cardiac work up and states they adjusted his BP meds and discharged from hospital yesterday.

## 2023-04-03 DIAGNOSIS — F4321 Adjustment disorder with depressed mood: Secondary | ICD-10-CM | POA: Diagnosis not present

## 2023-04-04 ENCOUNTER — Telehealth: Payer: Self-pay | Admitting: Cardiology

## 2023-04-04 DIAGNOSIS — E1122 Type 2 diabetes mellitus with diabetic chronic kidney disease: Secondary | ICD-10-CM | POA: Diagnosis not present

## 2023-04-04 DIAGNOSIS — N186 End stage renal disease: Secondary | ICD-10-CM | POA: Diagnosis not present

## 2023-04-04 DIAGNOSIS — D631 Anemia in chronic kidney disease: Secondary | ICD-10-CM | POA: Diagnosis not present

## 2023-04-04 DIAGNOSIS — E875 Hyperkalemia: Secondary | ICD-10-CM | POA: Diagnosis not present

## 2023-04-04 DIAGNOSIS — N2581 Secondary hyperparathyroidism of renal origin: Secondary | ICD-10-CM | POA: Diagnosis not present

## 2023-04-04 DIAGNOSIS — T7840XA Allergy, unspecified, initial encounter: Secondary | ICD-10-CM | POA: Diagnosis not present

## 2023-04-04 DIAGNOSIS — Z992 Dependence on renal dialysis: Secondary | ICD-10-CM | POA: Diagnosis not present

## 2023-04-04 DIAGNOSIS — D509 Iron deficiency anemia, unspecified: Secondary | ICD-10-CM | POA: Diagnosis not present

## 2023-04-04 NOTE — Telephone Encounter (Signed)
In view of recurrent hospitalization with chest pain, I had scheduled him for cardiac catheterization for this coming Friday that is tomorrow on 04/05/2023.  Spoke to his wife, patient feels that since medications have been changed with regard to hypertension management in the hospital, he is feeling better and he would like to schedule an appointment to see me and if symptoms were to come back or have recurrence then we will consider cardiac catheterization otherwise he would like to continue medical therapy.  I have agreed to this plan, advised him that chest pain is musculoskeletal and but it is extremely difficult to state whether he has had progression of coronary artery disease as well.  Reassuring that his EKG no changes, troponins have been negative.  However patient is extremely anxious and we are trying to avoid repeat hospitalizations and also repeat emergency room visits.  Patient has been scheduled to see me on 04/12/2023.

## 2023-04-05 ENCOUNTER — Encounter (HOSPITAL_COMMUNITY): Admission: RE | Payer: Self-pay | Source: Home / Self Care

## 2023-04-05 ENCOUNTER — Ambulatory Visit (HOSPITAL_COMMUNITY): Admission: RE | Admit: 2023-04-05 | Payer: Medicare HMO | Source: Home / Self Care | Admitting: Cardiology

## 2023-04-05 DIAGNOSIS — S80811A Abrasion, right lower leg, initial encounter: Secondary | ICD-10-CM | POA: Diagnosis not present

## 2023-04-05 DIAGNOSIS — G8929 Other chronic pain: Secondary | ICD-10-CM | POA: Diagnosis not present

## 2023-04-05 DIAGNOSIS — M1990 Unspecified osteoarthritis, unspecified site: Secondary | ICD-10-CM | POA: Diagnosis not present

## 2023-04-05 SURGERY — LEFT HEART CATH AND CORONARY ANGIOGRAPHY
Anesthesia: LOCAL

## 2023-04-06 DIAGNOSIS — E1122 Type 2 diabetes mellitus with diabetic chronic kidney disease: Secondary | ICD-10-CM | POA: Diagnosis not present

## 2023-04-06 DIAGNOSIS — Z992 Dependence on renal dialysis: Secondary | ICD-10-CM | POA: Diagnosis not present

## 2023-04-06 DIAGNOSIS — E875 Hyperkalemia: Secondary | ICD-10-CM | POA: Diagnosis not present

## 2023-04-06 DIAGNOSIS — T7840XA Allergy, unspecified, initial encounter: Secondary | ICD-10-CM | POA: Diagnosis not present

## 2023-04-06 DIAGNOSIS — N186 End stage renal disease: Secondary | ICD-10-CM | POA: Diagnosis not present

## 2023-04-06 DIAGNOSIS — D631 Anemia in chronic kidney disease: Secondary | ICD-10-CM | POA: Diagnosis not present

## 2023-04-06 DIAGNOSIS — D509 Iron deficiency anemia, unspecified: Secondary | ICD-10-CM | POA: Diagnosis not present

## 2023-04-06 DIAGNOSIS — N2581 Secondary hyperparathyroidism of renal origin: Secondary | ICD-10-CM | POA: Diagnosis not present

## 2023-04-08 ENCOUNTER — Telehealth: Payer: Self-pay

## 2023-04-08 DIAGNOSIS — E113512 Type 2 diabetes mellitus with proliferative diabetic retinopathy with macular edema, left eye: Secondary | ICD-10-CM | POA: Diagnosis not present

## 2023-04-08 DIAGNOSIS — E113511 Type 2 diabetes mellitus with proliferative diabetic retinopathy with macular edema, right eye: Secondary | ICD-10-CM | POA: Diagnosis not present

## 2023-04-08 DIAGNOSIS — Z794 Long term (current) use of insulin: Secondary | ICD-10-CM | POA: Diagnosis not present

## 2023-04-08 LAB — HM DIABETES EYE EXAM

## 2023-04-08 NOTE — Telephone Encounter (Signed)
Transition Care Management Follow-up Telephone Call Date of discharge and from where: [facility] 7/23 How have you been since you were released from the hospital? Doing ok Any questions or concerns? No  Items Reviewed: Did the pt receive and understand the discharge instructions provided? Yes  Medications obtained and verified? Yes  Other? No  Any new allergies since your discharge? No  Dietary orders reviewed? No Do you have support at home? Yes     Follow up appointments reviewed:  PCP Hospital f/u appt confirmed? No  Scheduled to see  on  @ . Specialist Hospital f/u appt confirmed? Yes  Scheduled to see  on  @ . Are transportation arrangements needed? No  If their condition worsens, is the pt aware to call PCP or go to the Emergency Dept.? Yes Was the patient provided with contact information for the PCP's office or ED? Yes Was to pt encouraged to call back with questions or concerns? Yes   Len[facility] Guide, [facility] [phone] [address] [place], [place], [place] [address] Phone: [phone] Email: [email or web address]

## 2023-04-10 DIAGNOSIS — Z992 Dependence on renal dialysis: Secondary | ICD-10-CM | POA: Diagnosis not present

## 2023-04-10 DIAGNOSIS — I509 Heart failure, unspecified: Secondary | ICD-10-CM | POA: Diagnosis not present

## 2023-04-10 DIAGNOSIS — I10 Essential (primary) hypertension: Secondary | ICD-10-CM | POA: Diagnosis not present

## 2023-04-10 DIAGNOSIS — N186 End stage renal disease: Secondary | ICD-10-CM | POA: Diagnosis not present

## 2023-04-10 DIAGNOSIS — I2510 Atherosclerotic heart disease of native coronary artery without angina pectoris: Secondary | ICD-10-CM | POA: Diagnosis not present

## 2023-04-10 DIAGNOSIS — E1122 Type 2 diabetes mellitus with diabetic chronic kidney disease: Secondary | ICD-10-CM | POA: Diagnosis not present

## 2023-04-11 DIAGNOSIS — N2581 Secondary hyperparathyroidism of renal origin: Secondary | ICD-10-CM | POA: Diagnosis not present

## 2023-04-11 DIAGNOSIS — Z992 Dependence on renal dialysis: Secondary | ICD-10-CM | POA: Diagnosis not present

## 2023-04-11 DIAGNOSIS — D631 Anemia in chronic kidney disease: Secondary | ICD-10-CM | POA: Diagnosis not present

## 2023-04-11 DIAGNOSIS — E875 Hyperkalemia: Secondary | ICD-10-CM | POA: Diagnosis not present

## 2023-04-11 DIAGNOSIS — T7840XA Allergy, unspecified, initial encounter: Secondary | ICD-10-CM | POA: Diagnosis not present

## 2023-04-11 DIAGNOSIS — N186 End stage renal disease: Secondary | ICD-10-CM | POA: Diagnosis not present

## 2023-04-11 DIAGNOSIS — D509 Iron deficiency anemia, unspecified: Secondary | ICD-10-CM | POA: Diagnosis not present

## 2023-04-11 DIAGNOSIS — R52 Pain, unspecified: Secondary | ICD-10-CM | POA: Diagnosis not present

## 2023-04-12 ENCOUNTER — Ambulatory Visit: Payer: Medicare HMO | Admitting: Cardiology

## 2023-04-12 DIAGNOSIS — N186 End stage renal disease: Secondary | ICD-10-CM | POA: Diagnosis not present

## 2023-04-12 DIAGNOSIS — Z992 Dependence on renal dialysis: Secondary | ICD-10-CM | POA: Diagnosis not present

## 2023-04-12 DIAGNOSIS — M1990 Unspecified osteoarthritis, unspecified site: Secondary | ICD-10-CM | POA: Diagnosis not present

## 2023-04-12 DIAGNOSIS — G8929 Other chronic pain: Secondary | ICD-10-CM | POA: Diagnosis not present

## 2023-04-13 DIAGNOSIS — Z992 Dependence on renal dialysis: Secondary | ICD-10-CM | POA: Diagnosis not present

## 2023-04-13 DIAGNOSIS — E875 Hyperkalemia: Secondary | ICD-10-CM | POA: Diagnosis not present

## 2023-04-13 DIAGNOSIS — N186 End stage renal disease: Secondary | ICD-10-CM | POA: Diagnosis not present

## 2023-04-13 DIAGNOSIS — R52 Pain, unspecified: Secondary | ICD-10-CM | POA: Diagnosis not present

## 2023-04-13 DIAGNOSIS — T7840XA Allergy, unspecified, initial encounter: Secondary | ICD-10-CM | POA: Diagnosis not present

## 2023-04-13 DIAGNOSIS — N2581 Secondary hyperparathyroidism of renal origin: Secondary | ICD-10-CM | POA: Diagnosis not present

## 2023-04-13 DIAGNOSIS — D631 Anemia in chronic kidney disease: Secondary | ICD-10-CM | POA: Diagnosis not present

## 2023-04-13 DIAGNOSIS — D509 Iron deficiency anemia, unspecified: Secondary | ICD-10-CM | POA: Diagnosis not present

## 2023-04-16 DIAGNOSIS — Z992 Dependence on renal dialysis: Secondary | ICD-10-CM | POA: Diagnosis not present

## 2023-04-16 DIAGNOSIS — R52 Pain, unspecified: Secondary | ICD-10-CM | POA: Diagnosis not present

## 2023-04-16 DIAGNOSIS — N186 End stage renal disease: Secondary | ICD-10-CM | POA: Diagnosis not present

## 2023-04-16 DIAGNOSIS — N2581 Secondary hyperparathyroidism of renal origin: Secondary | ICD-10-CM | POA: Diagnosis not present

## 2023-04-16 DIAGNOSIS — D631 Anemia in chronic kidney disease: Secondary | ICD-10-CM | POA: Diagnosis not present

## 2023-04-16 DIAGNOSIS — T7840XA Allergy, unspecified, initial encounter: Secondary | ICD-10-CM | POA: Diagnosis not present

## 2023-04-16 DIAGNOSIS — D509 Iron deficiency anemia, unspecified: Secondary | ICD-10-CM | POA: Diagnosis not present

## 2023-04-16 DIAGNOSIS — E875 Hyperkalemia: Secondary | ICD-10-CM | POA: Diagnosis not present

## 2023-04-18 DIAGNOSIS — T7840XA Allergy, unspecified, initial encounter: Secondary | ICD-10-CM | POA: Diagnosis not present

## 2023-04-18 DIAGNOSIS — D631 Anemia in chronic kidney disease: Secondary | ICD-10-CM | POA: Diagnosis not present

## 2023-04-18 DIAGNOSIS — R52 Pain, unspecified: Secondary | ICD-10-CM | POA: Diagnosis not present

## 2023-04-18 DIAGNOSIS — E875 Hyperkalemia: Secondary | ICD-10-CM | POA: Diagnosis not present

## 2023-04-18 DIAGNOSIS — Z992 Dependence on renal dialysis: Secondary | ICD-10-CM | POA: Diagnosis not present

## 2023-04-18 DIAGNOSIS — N2581 Secondary hyperparathyroidism of renal origin: Secondary | ICD-10-CM | POA: Diagnosis not present

## 2023-04-18 DIAGNOSIS — N186 End stage renal disease: Secondary | ICD-10-CM | POA: Diagnosis not present

## 2023-04-18 DIAGNOSIS — D509 Iron deficiency anemia, unspecified: Secondary | ICD-10-CM | POA: Diagnosis not present

## 2023-04-19 DIAGNOSIS — G8929 Other chronic pain: Secondary | ICD-10-CM | POA: Diagnosis not present

## 2023-04-19 DIAGNOSIS — M1990 Unspecified osteoarthritis, unspecified site: Secondary | ICD-10-CM | POA: Diagnosis not present

## 2023-04-20 DIAGNOSIS — E875 Hyperkalemia: Secondary | ICD-10-CM | POA: Diagnosis not present

## 2023-04-20 DIAGNOSIS — D631 Anemia in chronic kidney disease: Secondary | ICD-10-CM | POA: Diagnosis not present

## 2023-04-20 DIAGNOSIS — D509 Iron deficiency anemia, unspecified: Secondary | ICD-10-CM | POA: Diagnosis not present

## 2023-04-20 DIAGNOSIS — N2581 Secondary hyperparathyroidism of renal origin: Secondary | ICD-10-CM | POA: Diagnosis not present

## 2023-04-20 DIAGNOSIS — Z992 Dependence on renal dialysis: Secondary | ICD-10-CM | POA: Diagnosis not present

## 2023-04-20 DIAGNOSIS — R52 Pain, unspecified: Secondary | ICD-10-CM | POA: Diagnosis not present

## 2023-04-20 DIAGNOSIS — N186 End stage renal disease: Secondary | ICD-10-CM | POA: Diagnosis not present

## 2023-04-20 DIAGNOSIS — T7840XA Allergy, unspecified, initial encounter: Secondary | ICD-10-CM | POA: Diagnosis not present

## 2023-04-23 DIAGNOSIS — D509 Iron deficiency anemia, unspecified: Secondary | ICD-10-CM | POA: Diagnosis not present

## 2023-04-23 DIAGNOSIS — T7840XA Allergy, unspecified, initial encounter: Secondary | ICD-10-CM | POA: Diagnosis not present

## 2023-04-23 DIAGNOSIS — E875 Hyperkalemia: Secondary | ICD-10-CM | POA: Diagnosis not present

## 2023-04-23 DIAGNOSIS — Z992 Dependence on renal dialysis: Secondary | ICD-10-CM | POA: Diagnosis not present

## 2023-04-23 DIAGNOSIS — N2581 Secondary hyperparathyroidism of renal origin: Secondary | ICD-10-CM | POA: Diagnosis not present

## 2023-04-23 DIAGNOSIS — D631 Anemia in chronic kidney disease: Secondary | ICD-10-CM | POA: Diagnosis not present

## 2023-04-23 DIAGNOSIS — R52 Pain, unspecified: Secondary | ICD-10-CM | POA: Diagnosis not present

## 2023-04-23 DIAGNOSIS — N186 End stage renal disease: Secondary | ICD-10-CM | POA: Diagnosis not present

## 2023-04-24 ENCOUNTER — Ambulatory Visit: Payer: Medicare HMO | Admitting: Cardiology

## 2023-04-24 ENCOUNTER — Encounter: Payer: Self-pay | Admitting: Cardiology

## 2023-04-24 VITALS — BP 126/49 | HR 58 | Resp 16 | Ht 74.0 in

## 2023-04-24 DIAGNOSIS — I480 Paroxysmal atrial fibrillation: Secondary | ICD-10-CM

## 2023-04-24 DIAGNOSIS — I10 Essential (primary) hypertension: Secondary | ICD-10-CM

## 2023-04-24 DIAGNOSIS — R110 Nausea: Secondary | ICD-10-CM | POA: Diagnosis not present

## 2023-04-24 DIAGNOSIS — I25118 Atherosclerotic heart disease of native coronary artery with other forms of angina pectoris: Secondary | ICD-10-CM | POA: Diagnosis not present

## 2023-04-24 MED ORDER — ONDANSETRON HCL 4 MG PO TABS: 4.0000 mg | ORAL_TABLET | Freq: Three times a day (TID) | ORAL | 0 refills | Status: DC | PRN

## 2023-04-24 MED ORDER — AMLODIPINE BESYLATE 5 MG PO TABS
5.0000 mg | ORAL_TABLET | Freq: Every day | ORAL | Status: DC
Start: 2023-04-24 — End: 2023-05-28

## 2023-04-24 NOTE — Progress Notes (Signed)
Primary Physician/Referring:  Lonie Peak, PA-C  Patient ID: [name], male    DOB: 1956/04/03, 67 y.o.   MRN: [id number]  Chief Complaint  Patient presents with   Medication Management    HPI:    [name]  is a 67 y.o. Caucasian male patient with severe peripheral arterial disease and has history of bilateral below-knee amputation, multiple peripheral interventions in the past, uncontrolled diabetes mellitus, ESRD, severe diabetic retinopathy, coronary artery disease angioplasty to the right coronary artery on 10/29/2014, right carotid endarterectomy in 2012 , OSA Unable to tolerate CPAP, chronic back pain and neck and back surgery in past and on chronic pain medications, paroxysmal atrial fibrillation on Eliquis.  Patient is presently in the rehab facility since November 2023. He is presently in palliative care.  He has had multiple hospital admission with hypertensive emergency and also chest pain, I will schedule him for cardiac catheterization after discussion with his wife, decided to do conservative therapy.  Patient now presents for follow-up and states that he has not had any chest discomfort since hospitalization.  His blood pressure is not well-controlled but states that he feels very sleepy all the time and also is nauseous.  Unfortunately still smoking.  Social History   Tobacco Use   Smoking status: Every Day    Current packs/day: 0.50    Average packs/day: 0.5 packs/day for 35.0 years (17.5 ttl pk-yrs)    Types: Cigarettes   Smokeless tobacco: Never  Substance Use Topics   Alcohol use: Not Currently    Comment: "not in a long time"   Marital Status: Married   ROS  Review of Systems  Cardiovascular:  Negative for chest pain, dyspnea on exertion and palpitations.  Neurological:  Negative for dizziness.   Objective      04/24/2023    3:14 PM 04/02/2023   10:57 PM 04/02/2023    7:15 PM  Vitals with BMI  Height 6\' 2"     Systolic 126 124 191  Diastolic  49 78 60  Pulse 58 68 72    Blood pressure (!) 126/49, pulse (!) 58, resp. rate 16, height 6\' 2"  (1.88 m), SpO2 94%. Body mass index is 30.29 kg/m.   Physical Exam Vitals reviewed.  Constitutional:      Appearance: He is obese.  Neck:     Vascular: Carotid bruit (bilateral) present. No JVD.  Cardiovascular:     Rate and Rhythm: Normal rate and regular rhythm.     Pulses:          Femoral pulses are 1+ on the right side and 1+ on the left side.       Right popliteal pulse not accessible and left popliteal pulse not accessible.     Heart sounds: Murmur heard.     No gallop.     Comments: Left brachial artery AV Shunt noted. Venous hum heard over the precordium. Pulmonary:     Effort: Pulmonary effort is normal.     Breath sounds: Normal breath sounds.  Abdominal:     General: Abdomen is flat.     Palpations: Abdomen is soft.  Musculoskeletal:        General: Deformity (bilataral BKA) present.    Laboratory examination:   Recent Labs    03/30/23 1344 03/31/23 0314 04/02/23 1420  NA 135 138 136  K 4.3 3.9 3.6  CL 97* 95* 95*  CO2 24 26 27   GLUCOSE 171* 106* 183*  BUN 38* 46* 26*  CREATININE 5.97* 6.32* 3.95*  CALCIUM 8.4* 8.6* 8.0*  GFRNONAA 10* 9* 16*      Latest Ref Rng & Units 04/02/2023    2:20 PM 03/31/2023    3:14 AM 03/30/2023    7:22 PM  CMP  Glucose 70 - 99 mg/dL 191  478    BUN 8 - 23 mg/dL 26  46    Creatinine 2.95 - 1.24 mg/dL 6.21  3.08    Sodium 657 - 145 mmol/L 136  138    Potassium 3.5 - 5.1 mmol/L 3.6  3.9    Chloride 98 - 111 mmol/L 95  95    CO2 22 - 32 mmol/L 27  26    Calcium 8.9 - 10.3 mg/dL 8.0  8.6    Total Protein 6.5 - 8.1 g/dL  5.4  6.0   Total Bilirubin 0.3 - 1.2 mg/dL  0.5  0.4   Alkaline Phos 38 - 126 U/L  108  119   AST 15 - 41 U/L  21  24   ALT 0 - 44 U/L  25  27       Latest Ref Rng & Units 04/02/2023    2:20 PM 03/31/2023    3:14 AM 03/30/2023    1:44 PM  CBC  WBC 4.0 - 10.5 K/uL 8.5  7.6  8.7   Hemoglobin 13.0 - 17.0  g/dL 84.6  9.8  96.2   Hematocrit 39.0 - 52.0 % 30.6  29.2  32.5   Platelets 150 - 400 K/uL 174  166  180    Lipid Panel     Component Value Date/Time   CHOL 104 03/31/2023 0314   CHOL 117 05/30/2020 1646   TRIG 135 03/31/2023 0314   HDL 23 (L) 03/31/2023 0314   HDL 24 (L) 05/30/2020 1646   CHOLHDL 4.5 03/31/2023 0314   VLDL 27 03/31/2023 0314   LDLCALC 54 03/31/2023 0314   LDLCALC 69 05/30/2020 1646   HEMOGLOBIN A1C Lab Results  Component Value Date   HGBA1C 6.1 (H) 03/30/2023   MPG 128.37 03/30/2023   Cardiac Studies:   Coronary angiogram 09/17/18: Widely patent stent stents. Normal LVEF. Coronary Angiogram 10/29/2014: CAD s/p stenting of the distal, mid and proximal RCA with implantation of 3 overlapping drug-eluting stent, from distal to proximal 2.5 x 38 mm, 2.75 x 38 mm and a 2.75 x 20 mm Promus premier DES.   Carotid artery duplex  06/09/2019:  Minimal stenosis in bilateral ICA of 1-15%. Heterogeneous plaque. Stenosis  in the right external carotid artery (<50%).  Left carotid endarterectomy site is patent.  Right vertebral artery flow is not well visualized. Antegrade left  vertebral artery flow.  No significant change from 03/01/2017. Follow up studies when clinically  Indicated.  Echocardiogram 03/31/2023:   1. Left ventricular ejection fraction, by estimation, is 60 to 65%. The left ventricle has normal function. The left ventricle has no regional wall motion abnormalities. There is mild concentric left ventricular hypertrophy. Left ventricular diastolic  parameters are consistent with Grade I diastolic dysfunction (impaired relaxation). Elevated left atrial pressure.  2. Right ventricular systolic function is normal. The right ventricular size is normal.  3. Left atrial size was moderately dilated.  4. The mitral valve is normal in structure. No evidence of mitral valve regurgitation. No evidence of mitral stenosis. Moderate mitral annular calcification.  5. The  aortic valve is tricuspid. Aortic valve regurgitation is not visualized. Aortic valve sclerosis/calcification is present, without any evidence of aortic  stenosis.  6. The inferior vena cava is dilated in size with >50% respiratory variability, suggesting right atrial pressure of 8 mmHg.  EKG:   EKG 04/24/2023: Sinus rhythm with fascicular block at the rate of 47 bpm, left atrial enlargement, IVCD, borderline criteria for LVH.  Compared to 01/21/2023, no significant change.  06/12/2021: Atypical atrial flutter with RVR at the rate of 106 bpm, left axis deviation, left anterior fascicular block.  Right bundle branch block.  Nonspecific T abnormality.  Allergies   Allergies  Allergen Reactions   Contrast Media [Iodinated Contrast Media] Shortness Of Breath and Other (See Comments)    Difficulty breathing and altered mental status     Ivp Dye [Iodinated Contrast Media] Anaphylaxis, Shortness Of Breath and Other (See Comments)    Breathing problems, altered mental state    Adhesive [Tape] Rash and Other (See Comments)    Rash after 1 day of use Do not leave on longer then 3 days with out be changed   Latex Rash and Other (See Comments)    A severe rash appears after the first 24 hours of being placed    Current Outpatient Medications:    amiodarone (PACERONE) 200 MG tablet, Take 1 tablet (200 mg total) by mouth daily. (Patient taking differently: Take 100 mg by mouth daily.), Disp: 100 tablet, Rfl: 1   apixaban (ELIQUIS) 5 MG TABS tablet, Take 1 tablet (5 mg total) by mouth 2 (two) times daily., Disp: 60 tablet, Rfl: 1   atorvastatin (LIPITOR) 40 MG tablet, Take 40 mg by mouth at bedtime. , Disp: , Rfl:    B Complex-C-Zn-Folic Acid (DIALYVITE/ZINC) TABS, Take 1 tablet by mouth Every Tuesday,Thursday,and Saturday with dialysis., Disp: , Rfl:    famotidine (PEPCID) 20 MG tablet, Take 20 mg by mouth every other day., Disp: , Rfl:    fentaNYL (DURAGESIC) 50 MCG/HR, Place 1 patch onto the skin  every 3 (three) days., Disp: 2 patch, Rfl: 0   folic acid (FOLVITE) 1 MG tablet, Take 1 mg by mouth daily., Disp: , Rfl:    hydrALAZINE (APRESOLINE) 50 MG tablet, Take 1 tablet (50 mg total) by mouth every 8 (eight) hours., Disp: , Rfl:    insulin aspart (NOVOLOG FLEXPEN) 100 UNIT/ML FlexPen, Inject 0-12 Units into the skin See admin instructions. Inject as per sliding scale: If 0 - 69 = 0 initiate hypoglycemia orders;  70 - 150 = 0;  151 - 200 = 2;  201 - 250 = 4;  251 - 300 = 6;  301 - 350 = 8;  351 - 400 = 10;  401 - 450 = 12.  If >400, give 12 units, recheck in 1 hour. If still >400, call MD/NP for further orders. Subcutaneously with meals in addition to scheduled 4 unites with meals., Disp: , Rfl:    insulin glargine (LANTUS) 100 UNIT/ML injection, Inject 0.2 mLs (20 Units total) into the skin at bedtime., Disp: 10 mL, Rfl: 0   isosorbide mononitrate (IMDUR) 60 MG 24 hr tablet, Take 1 tablet (60 mg total) by mouth daily., Disp: 90 tablet, Rfl: 1   lidocaine-prilocaine (EMLA) cream, Apply 1 application topically See admin instructions. Prior to Dialysis days Tuesday,Thursday and saturday, Disp: , Rfl:    loratadine (CLARITIN) 10 MG tablet, Take 10 mg by mouth daily., Disp: , Rfl:    metoCLOPramide (REGLAN) 5 MG tablet, Take 1 tablet (5 mg total) by mouth 4 (four) times daily -  before meals and at bedtime. (Patient taking  differently: Take 5 mg by mouth in the morning, at noon, in the evening, and at bedtime.), Disp: 360 tablet, Rfl: 1   NEEDLE, REUSABLE, 22 G 22G X 1-1/2" MISC, 1 Units by Does not apply route as directed. For B12 IM inj, Disp: 10 each, Rfl: 0   omega-3 acid ethyl esters (LOVAZA) 1 g capsule, Take 2 g by mouth 2 (two) times daily., Disp: , Rfl:    ondansetron (ZOFRAN) 4 MG tablet, Take 1 tablet (4 mg total) by mouth every 8 (eight) hours as needed for nausea or vomiting., Disp: 60 tablet, Rfl: 0   pantoprazole (PROTONIX) 40 MG tablet, Take 1 tablet (40 mg total) by mouth 2 (two)  times daily for 30 days, THEN 1 tablet (40 mg total) daily. (Patient taking differently:  1 tablet (40 mg total) twice daily.), Disp: 120 tablet, Rfl: 0   Polyvinyl Alcohol-Povidone PF 1.4-0.6 % SOLN, Place 1 drop into both eyes 2 (two) times daily as needed (dry eyes). , Disp: , Rfl:    pregabalin (LYRICA) 75 MG capsule, Take 1 capsule (75 mg total) by mouth daily for 5 days., Disp: 5 capsule, Rfl: 0   rOPINIRole (REQUIP) 2 MG tablet, Take 2 mg by mouth at bedtime., Disp: , Rfl:    senna-docusate (SENOKOT-S) 8.6-50 MG tablet, Take 2 tablets by mouth at bedtime., Disp: , Rfl:    Syringe/Needle, Disp, (SYRINGE 3CC/22GX1-1/2") 22G X 1-1/2" 3 ML MISC, 1 Syringe by Does not apply route as directed. For b12 IM inj, Disp: 10 each, Rfl: 0   tamsulosin (FLOMAX) 0.4 MG CAPS capsule, Take 0.4 mg by mouth daily., Disp: , Rfl:    amLODipine (NORVASC) 5 MG tablet, Take 1 tablet (5 mg total) by mouth daily., Disp: , Rfl:    labetalol (NORMODYNE) 200 MG tablet, Take 1 tablet (200 mg total) by mouth 2 (two) times daily., Disp: , Rfl:    nitroGLYCERIN (NITROSTAT) 0.4 MG SL tablet, Place 0.4 mg under the tongue every 5 (five) minutes as needed for chest pain., Disp: , Rfl:    Assessment     ICD-10-CM   1. Coronary artery disease of native artery of native heart with stable angina pectoris (HCC)  I25.118     2. Paroxysmal atrial fibrillation (HCC)  I48.0 EKG 12-Lead    3. Primary hypertension  I10 amLODipine (NORVASC) 5 MG tablet    4. Nausea  R11.0 ondansetron (ZOFRAN) 4 MG tablet       Meds ordered this encounter  Medications   ondansetron (ZOFRAN) 4 MG tablet    Sig: Take 1 tablet (4 mg total) by mouth every 8 (eight) hours as needed for nausea or vomiting.    Dispense:  60 tablet    Refill:  0   amLODipine (NORVASC) 5 MG tablet    Sig: Take 1 tablet (5 mg total) by mouth daily.    Medications Discontinued During This Encounter  Medication Reason   Glucosamine HCl (GLUCOSAMINE PO) Discontinued by  provider   amLODipine (NORVASC) 10 MG tablet     CHA2DS2-VASc Score is 4.  Yearly risk of stroke: 5% (A, HTN, Vasc Dz).  Score of 1=0.6; 2=2.2; 3=3.2; 4=4.8; 5=7.2; 6=9.8; 7=>9.8) -(CHF; HTN; vasc disease DM,  Male = 1; Age <65 =0; 65-74 = 1,  >75 =2; stroke/embolism= 2).     Recommendations:   [name]  is a 67 y.o. Caucasian male patient with severe peripheral arterial disease and has history of bilateral below-knee amputation, multiple  peripheral interventions in the past, uncontrolled diabetes mellitus, ESRD, severe diabetic retinopathy, coronary artery disease angioplasty to the right coronary artery on 10/29/2014, right carotid endarterectomy in 2012 , OSA Unable to tolerate CPAP, chronic back pain and neck and back surgery in past and on chronic pain medications, paroxysmal atrial fibrillation on Eliquis.  1. Coronary artery disease of native artery of native heart with stable angina pectoris [facility]) Patient has had multiple hospital admission being sent over from the dialysis or from the extended care facility and I had initially set him up for cardiac catheterization in view of recurrent admissions but after discussions with his wife we are considering medical therapy only, which I completely agree.  Fortunately he has not had any recurrence of chest pain.  2. Paroxysmal atrial fibrillation (HCC) He is maintaining sinus rhythm, he has not had any bleeding complication.  Continue the same. - EKG 12-Lead  3. Primary hypertension Patient complains of significant fatigue, sleepiness.  I suspect this is related to aggressive control of his hypertension, patient has been hypertensive for a long time and has had multiple admission to the hospital with hypertensive urgency.  Finally the blood pressure much improved, I will only reduce amlodipine from 10 mg to 5 mg daily, suspect he needs higher perfusion pressure to his brain in view of arteriosclerosis. - amLODipine (NORVASC) 5 MG tablet;  Take 1 tablet (5 mg total) by mouth daily.  4. Nausea Patient has constant nausea, suspect he could be developing diabetic gastroparesis.  Will prescribe him Zofran 4 mg 3 times daily as needed.  Patient is palliative care which is very appropriate.  I will see him back in 3 months.  I spoke to his wife over the telephone. - ondansetron (ZOFRAN) 4 MG tablet; Take 1 tablet (4 mg total) by mouth every 8 (eight) hours as needed for nausea or vomiting.  Dispense: 60 tablet; Refill: 0   Yates Decamp, MD, [facility] 04/24/2023, 6:57 PM Office: [phone] Fax: [phone] Pager: [phone]

## 2023-04-25 DIAGNOSIS — Z992 Dependence on renal dialysis: Secondary | ICD-10-CM | POA: Diagnosis not present

## 2023-04-25 DIAGNOSIS — N2581 Secondary hyperparathyroidism of renal origin: Secondary | ICD-10-CM | POA: Diagnosis not present

## 2023-04-25 DIAGNOSIS — I10 Essential (primary) hypertension: Secondary | ICD-10-CM | POA: Diagnosis not present

## 2023-04-25 DIAGNOSIS — T7840XA Allergy, unspecified, initial encounter: Secondary | ICD-10-CM | POA: Diagnosis not present

## 2023-04-25 DIAGNOSIS — N186 End stage renal disease: Secondary | ICD-10-CM | POA: Diagnosis not present

## 2023-04-25 DIAGNOSIS — D631 Anemia in chronic kidney disease: Secondary | ICD-10-CM | POA: Diagnosis not present

## 2023-04-25 DIAGNOSIS — R52 Pain, unspecified: Secondary | ICD-10-CM | POA: Diagnosis not present

## 2023-04-25 DIAGNOSIS — D509 Iron deficiency anemia, unspecified: Secondary | ICD-10-CM | POA: Diagnosis not present

## 2023-04-25 DIAGNOSIS — E875 Hyperkalemia: Secondary | ICD-10-CM | POA: Diagnosis not present

## 2023-04-25 DIAGNOSIS — I5032 Chronic diastolic (congestive) heart failure: Secondary | ICD-10-CM | POA: Diagnosis not present

## 2023-04-29 ENCOUNTER — Ambulatory Visit: Payer: Medicare HMO | Admitting: Cardiology

## 2023-04-29 DIAGNOSIS — Z515 Encounter for palliative care: Secondary | ICD-10-CM | POA: Diagnosis not present

## 2023-04-30 DIAGNOSIS — R52 Pain, unspecified: Secondary | ICD-10-CM | POA: Diagnosis not present

## 2023-04-30 DIAGNOSIS — N2581 Secondary hyperparathyroidism of renal origin: Secondary | ICD-10-CM | POA: Diagnosis not present

## 2023-04-30 DIAGNOSIS — T7840XA Allergy, unspecified, initial encounter: Secondary | ICD-10-CM | POA: Diagnosis not present

## 2023-04-30 DIAGNOSIS — N186 End stage renal disease: Secondary | ICD-10-CM | POA: Diagnosis not present

## 2023-04-30 DIAGNOSIS — D509 Iron deficiency anemia, unspecified: Secondary | ICD-10-CM | POA: Diagnosis not present

## 2023-04-30 DIAGNOSIS — E875 Hyperkalemia: Secondary | ICD-10-CM | POA: Diagnosis not present

## 2023-04-30 DIAGNOSIS — D631 Anemia in chronic kidney disease: Secondary | ICD-10-CM | POA: Diagnosis not present

## 2023-04-30 DIAGNOSIS — Z992 Dependence on renal dialysis: Secondary | ICD-10-CM | POA: Diagnosis not present

## 2023-05-02 DIAGNOSIS — R52 Pain, unspecified: Secondary | ICD-10-CM | POA: Diagnosis not present

## 2023-05-02 DIAGNOSIS — T7840XA Allergy, unspecified, initial encounter: Secondary | ICD-10-CM | POA: Diagnosis not present

## 2023-05-02 DIAGNOSIS — D509 Iron deficiency anemia, unspecified: Secondary | ICD-10-CM | POA: Diagnosis not present

## 2023-05-02 DIAGNOSIS — D631 Anemia in chronic kidney disease: Secondary | ICD-10-CM | POA: Diagnosis not present

## 2023-05-02 DIAGNOSIS — N186 End stage renal disease: Secondary | ICD-10-CM | POA: Diagnosis not present

## 2023-05-02 DIAGNOSIS — N2581 Secondary hyperparathyroidism of renal origin: Secondary | ICD-10-CM | POA: Diagnosis not present

## 2023-05-02 DIAGNOSIS — E875 Hyperkalemia: Secondary | ICD-10-CM | POA: Diagnosis not present

## 2023-05-02 DIAGNOSIS — Z992 Dependence on renal dialysis: Secondary | ICD-10-CM | POA: Diagnosis not present

## 2023-05-07 ENCOUNTER — Ambulatory Visit: Payer: Medicare HMO | Admitting: Cardiology

## 2023-05-07 DIAGNOSIS — D631 Anemia in chronic kidney disease: Secondary | ICD-10-CM | POA: Diagnosis not present

## 2023-05-07 DIAGNOSIS — Z992 Dependence on renal dialysis: Secondary | ICD-10-CM | POA: Diagnosis not present

## 2023-05-07 DIAGNOSIS — E875 Hyperkalemia: Secondary | ICD-10-CM | POA: Diagnosis not present

## 2023-05-07 DIAGNOSIS — D509 Iron deficiency anemia, unspecified: Secondary | ICD-10-CM | POA: Diagnosis not present

## 2023-05-07 DIAGNOSIS — N186 End stage renal disease: Secondary | ICD-10-CM | POA: Diagnosis not present

## 2023-05-07 DIAGNOSIS — R52 Pain, unspecified: Secondary | ICD-10-CM | POA: Diagnosis not present

## 2023-05-07 DIAGNOSIS — N2581 Secondary hyperparathyroidism of renal origin: Secondary | ICD-10-CM | POA: Diagnosis not present

## 2023-05-07 DIAGNOSIS — T7840XA Allergy, unspecified, initial encounter: Secondary | ICD-10-CM | POA: Diagnosis not present

## 2023-05-08 DIAGNOSIS — I871 Compression of vein: Secondary | ICD-10-CM | POA: Diagnosis not present

## 2023-05-08 DIAGNOSIS — N186 End stage renal disease: Secondary | ICD-10-CM | POA: Diagnosis not present

## 2023-05-08 DIAGNOSIS — Z992 Dependence on renal dialysis: Secondary | ICD-10-CM | POA: Diagnosis not present

## 2023-05-08 DIAGNOSIS — T82858A Stenosis of vascular prosthetic devices, implants and grafts, initial encounter: Secondary | ICD-10-CM | POA: Diagnosis not present

## 2023-05-09 DIAGNOSIS — I482 Chronic atrial fibrillation, unspecified: Secondary | ICD-10-CM | POA: Diagnosis not present

## 2023-05-09 DIAGNOSIS — N2581 Secondary hyperparathyroidism of renal origin: Secondary | ICD-10-CM | POA: Diagnosis not present

## 2023-05-09 DIAGNOSIS — D509 Iron deficiency anemia, unspecified: Secondary | ICD-10-CM | POA: Diagnosis not present

## 2023-05-09 DIAGNOSIS — T7840XA Allergy, unspecified, initial encounter: Secondary | ICD-10-CM | POA: Diagnosis not present

## 2023-05-09 DIAGNOSIS — N186 End stage renal disease: Secondary | ICD-10-CM | POA: Diagnosis not present

## 2023-05-09 DIAGNOSIS — I132 Hypertensive heart and chronic kidney disease with heart failure and with stage 5 chronic kidney disease, or end stage renal disease: Secondary | ICD-10-CM | POA: Diagnosis not present

## 2023-05-09 DIAGNOSIS — R52 Pain, unspecified: Secondary | ICD-10-CM | POA: Diagnosis not present

## 2023-05-09 DIAGNOSIS — E875 Hyperkalemia: Secondary | ICD-10-CM | POA: Diagnosis not present

## 2023-05-09 DIAGNOSIS — Z992 Dependence on renal dialysis: Secondary | ICD-10-CM | POA: Diagnosis not present

## 2023-05-09 DIAGNOSIS — D631 Anemia in chronic kidney disease: Secondary | ICD-10-CM | POA: Diagnosis not present

## 2023-05-11 DIAGNOSIS — D631 Anemia in chronic kidney disease: Secondary | ICD-10-CM | POA: Diagnosis not present

## 2023-05-11 DIAGNOSIS — N186 End stage renal disease: Secondary | ICD-10-CM | POA: Diagnosis not present

## 2023-05-11 DIAGNOSIS — R52 Pain, unspecified: Secondary | ICD-10-CM | POA: Diagnosis not present

## 2023-05-11 DIAGNOSIS — D509 Iron deficiency anemia, unspecified: Secondary | ICD-10-CM | POA: Diagnosis not present

## 2023-05-11 DIAGNOSIS — E1122 Type 2 diabetes mellitus with diabetic chronic kidney disease: Secondary | ICD-10-CM | POA: Diagnosis not present

## 2023-05-11 DIAGNOSIS — Z992 Dependence on renal dialysis: Secondary | ICD-10-CM | POA: Diagnosis not present

## 2023-05-11 DIAGNOSIS — E875 Hyperkalemia: Secondary | ICD-10-CM | POA: Diagnosis not present

## 2023-05-11 DIAGNOSIS — N2581 Secondary hyperparathyroidism of renal origin: Secondary | ICD-10-CM | POA: Diagnosis not present

## 2023-05-11 DIAGNOSIS — T7840XA Allergy, unspecified, initial encounter: Secondary | ICD-10-CM | POA: Diagnosis not present

## 2023-05-14 DIAGNOSIS — N186 End stage renal disease: Secondary | ICD-10-CM | POA: Diagnosis not present

## 2023-05-14 DIAGNOSIS — N2581 Secondary hyperparathyroidism of renal origin: Secondary | ICD-10-CM | POA: Diagnosis not present

## 2023-05-14 DIAGNOSIS — Z992 Dependence on renal dialysis: Secondary | ICD-10-CM | POA: Diagnosis not present

## 2023-05-15 ENCOUNTER — Telehealth: Payer: Self-pay

## 2023-05-15 ENCOUNTER — Ambulatory Visit: Payer: Medicare HMO | Admitting: Cardiology

## 2023-05-15 DIAGNOSIS — R21 Rash and other nonspecific skin eruption: Secondary | ICD-10-CM | POA: Diagnosis not present

## 2023-05-15 DIAGNOSIS — Z89512 Acquired absence of left leg below knee: Secondary | ICD-10-CM | POA: Diagnosis not present

## 2023-05-15 DIAGNOSIS — L89309 Pressure ulcer of unspecified buttock, unspecified stage: Secondary | ICD-10-CM | POA: Diagnosis not present

## 2023-05-15 DIAGNOSIS — L89892 Pressure ulcer of other site, stage 2: Secondary | ICD-10-CM | POA: Diagnosis not present

## 2023-05-15 NOTE — Telephone Encounter (Signed)
Patient wife called to inform us that patient was las seen 04/24/2023 and was told patient Labetalol 200mg  was going decrease due to dizziness. Patient wife wants to know if you could give him a new prescribe to give it to the nursing home

## 2023-05-15 NOTE — Telephone Encounter (Signed)
I reduced Amlodipine from 10 mg to 5 mg and not labetalol. Is his BP been low, Do not want to stop the meds as he gets severe hypertension if we change anything

## 2023-05-16 DIAGNOSIS — N2581 Secondary hyperparathyroidism of renal origin: Secondary | ICD-10-CM | POA: Diagnosis not present

## 2023-05-16 DIAGNOSIS — M159 Polyosteoarthritis, unspecified: Secondary | ICD-10-CM | POA: Diagnosis not present

## 2023-05-16 DIAGNOSIS — N186 End stage renal disease: Secondary | ICD-10-CM | POA: Diagnosis not present

## 2023-05-16 DIAGNOSIS — G8929 Other chronic pain: Secondary | ICD-10-CM | POA: Diagnosis not present

## 2023-05-16 DIAGNOSIS — Z992 Dependence on renal dialysis: Secondary | ICD-10-CM | POA: Diagnosis not present

## 2023-05-20 NOTE — Telephone Encounter (Signed)
Called patient to inform him about his message above. Patient understood

## 2023-05-23 DIAGNOSIS — Z992 Dependence on renal dialysis: Secondary | ICD-10-CM | POA: Diagnosis not present

## 2023-05-23 DIAGNOSIS — Z91158 Patient's noncompliance with renal dialysis for other reason: Secondary | ICD-10-CM | POA: Diagnosis not present

## 2023-05-23 DIAGNOSIS — N186 End stage renal disease: Secondary | ICD-10-CM | POA: Diagnosis not present

## 2023-05-24 DIAGNOSIS — G8929 Other chronic pain: Secondary | ICD-10-CM | POA: Diagnosis not present

## 2023-05-24 DIAGNOSIS — M159 Polyosteoarthritis, unspecified: Secondary | ICD-10-CM | POA: Diagnosis not present

## 2023-05-28 ENCOUNTER — Emergency Department (HOSPITAL_COMMUNITY): Payer: Medicare HMO

## 2023-05-28 ENCOUNTER — Encounter (HOSPITAL_COMMUNITY): Payer: Self-pay | Admitting: Nephrology

## 2023-05-28 ENCOUNTER — Other Ambulatory Visit: Payer: Self-pay

## 2023-05-28 ENCOUNTER — Inpatient Hospital Stay (HOSPITAL_COMMUNITY)
Admission: EM | Admit: 2023-05-28 | Discharge: 2023-05-31 | DRG: 871 | Disposition: A | Discharge status: Skilled Nursing Facility | Payer: Medicare HMO | Source: Skilled Nursing Facility | Attending: Internal Medicine | Admitting: Internal Medicine

## 2023-05-28 ENCOUNTER — Inpatient Hospital Stay (HOSPITAL_COMMUNITY): Payer: Medicare HMO

## 2023-05-28 DIAGNOSIS — K760 Fatty (change of) liver, not elsewhere classified: Secondary | ICD-10-CM | POA: Diagnosis present

## 2023-05-28 DIAGNOSIS — I959 Hypotension, unspecified: Secondary | ICD-10-CM | POA: Diagnosis not present

## 2023-05-28 DIAGNOSIS — E872 Acidosis, unspecified: Secondary | ICD-10-CM | POA: Diagnosis not present

## 2023-05-28 DIAGNOSIS — R401 Stupor: Secondary | ICD-10-CM | POA: Diagnosis not present

## 2023-05-28 DIAGNOSIS — Z8249 Family history of ischemic heart disease and other diseases of the circulatory system: Secondary | ICD-10-CM

## 2023-05-28 DIAGNOSIS — E11319 Type 2 diabetes mellitus with unspecified diabetic retinopathy without macular edema: Secondary | ICD-10-CM | POA: Diagnosis present

## 2023-05-28 DIAGNOSIS — Z91041 Radiographic dye allergy status: Secondary | ICD-10-CM

## 2023-05-28 DIAGNOSIS — E871 Hypo-osmolality and hyponatremia: Secondary | ICD-10-CM | POA: Diagnosis present

## 2023-05-28 DIAGNOSIS — I2489 Other forms of acute ischemic heart disease: Secondary | ICD-10-CM | POA: Diagnosis present

## 2023-05-28 DIAGNOSIS — E1122 Type 2 diabetes mellitus with diabetic chronic kidney disease: Secondary | ICD-10-CM | POA: Diagnosis not present

## 2023-05-28 DIAGNOSIS — G4733 Obstructive sleep apnea (adult) (pediatric): Secondary | ICD-10-CM | POA: Diagnosis present

## 2023-05-28 DIAGNOSIS — I5032 Chronic diastolic (congestive) heart failure: Secondary | ICD-10-CM | POA: Diagnosis present

## 2023-05-28 DIAGNOSIS — G9341 Metabolic encephalopathy: Secondary | ICD-10-CM | POA: Diagnosis present

## 2023-05-28 DIAGNOSIS — G894 Chronic pain syndrome: Secondary | ICD-10-CM | POA: Diagnosis present

## 2023-05-28 DIAGNOSIS — Z89512 Acquired absence of left leg below knee: Secondary | ICD-10-CM

## 2023-05-28 DIAGNOSIS — Z794 Long term (current) use of insulin: Secondary | ICD-10-CM

## 2023-05-28 DIAGNOSIS — E7800 Pure hypercholesterolemia, unspecified: Secondary | ICD-10-CM | POA: Diagnosis not present

## 2023-05-28 DIAGNOSIS — N250 Renal osteodystrophy: Secondary | ICD-10-CM | POA: Diagnosis not present

## 2023-05-28 DIAGNOSIS — Z79891 Long term (current) use of opiate analgesic: Secondary | ICD-10-CM

## 2023-05-28 DIAGNOSIS — E875 Hyperkalemia: Secondary | ICD-10-CM | POA: Diagnosis present

## 2023-05-28 DIAGNOSIS — K219 Gastro-esophageal reflux disease without esophagitis: Secondary | ICD-10-CM | POA: Diagnosis present

## 2023-05-28 DIAGNOSIS — Z993 Dependence on wheelchair: Secondary | ICD-10-CM

## 2023-05-28 DIAGNOSIS — I480 Paroxysmal atrial fibrillation: Secondary | ICD-10-CM | POA: Diagnosis present

## 2023-05-28 DIAGNOSIS — K2270 Barrett's esophagus without dysplasia: Secondary | ICD-10-CM | POA: Diagnosis present

## 2023-05-28 DIAGNOSIS — Z955 Presence of coronary angioplasty implant and graft: Secondary | ICD-10-CM

## 2023-05-28 DIAGNOSIS — I120 Hypertensive chronic kidney disease with stage 5 chronic kidney disease or end stage renal disease: Secondary | ICD-10-CM | POA: Diagnosis not present

## 2023-05-28 DIAGNOSIS — G8929 Other chronic pain: Secondary | ICD-10-CM | POA: Diagnosis present

## 2023-05-28 DIAGNOSIS — K5750 Diverticulosis of both small and large intestine without perforation or abscess without bleeding: Secondary | ICD-10-CM | POA: Diagnosis not present

## 2023-05-28 DIAGNOSIS — L89226 Pressure-induced deep tissue damage of left hip: Secondary | ICD-10-CM | POA: Diagnosis present

## 2023-05-28 DIAGNOSIS — E669 Obesity, unspecified: Secondary | ICD-10-CM | POA: Diagnosis present

## 2023-05-28 DIAGNOSIS — R4182 Altered mental status, unspecified: Secondary | ICD-10-CM | POA: Diagnosis not present

## 2023-05-28 DIAGNOSIS — F17210 Nicotine dependence, cigarettes, uncomplicated: Secondary | ICD-10-CM | POA: Diagnosis present

## 2023-05-28 DIAGNOSIS — L89152 Pressure ulcer of sacral region, stage 2: Secondary | ICD-10-CM | POA: Diagnosis not present

## 2023-05-28 DIAGNOSIS — E1151 Type 2 diabetes mellitus with diabetic peripheral angiopathy without gangrene: Secondary | ICD-10-CM | POA: Diagnosis present

## 2023-05-28 DIAGNOSIS — Z96651 Presence of right artificial knee joint: Secondary | ICD-10-CM | POA: Diagnosis present

## 2023-05-28 DIAGNOSIS — D631 Anemia in chronic kidney disease: Secondary | ICD-10-CM | POA: Diagnosis not present

## 2023-05-28 DIAGNOSIS — J690 Pneumonitis due to inhalation of food and vomit: Secondary | ICD-10-CM | POA: Diagnosis present

## 2023-05-28 DIAGNOSIS — Z83438 Family history of other disorder of lipoprotein metabolism and other lipidemia: Secondary | ICD-10-CM

## 2023-05-28 DIAGNOSIS — I672 Cerebral atherosclerosis: Secondary | ICD-10-CM | POA: Diagnosis not present

## 2023-05-28 DIAGNOSIS — J449 Chronic obstructive pulmonary disease, unspecified: Secondary | ICD-10-CM | POA: Diagnosis present

## 2023-05-28 DIAGNOSIS — I6523 Occlusion and stenosis of bilateral carotid arteries: Secondary | ICD-10-CM | POA: Diagnosis not present

## 2023-05-28 DIAGNOSIS — Z1152 Encounter for screening for COVID-19: Secondary | ICD-10-CM

## 2023-05-28 DIAGNOSIS — Z79899 Other long term (current) drug therapy: Secondary | ICD-10-CM

## 2023-05-28 DIAGNOSIS — A419 Sepsis, unspecified organism: Principal | ICD-10-CM | POA: Diagnosis present

## 2023-05-28 DIAGNOSIS — E8779 Other fluid overload: Secondary | ICD-10-CM | POA: Diagnosis not present

## 2023-05-28 DIAGNOSIS — Z91158 Patient's noncompliance with renal dialysis for other reason: Secondary | ICD-10-CM

## 2023-05-28 DIAGNOSIS — Z743 Need for continuous supervision: Secondary | ICD-10-CM | POA: Diagnosis not present

## 2023-05-28 DIAGNOSIS — Z89511 Acquired absence of right leg below knee: Secondary | ICD-10-CM

## 2023-05-28 DIAGNOSIS — Z8673 Personal history of transient ischemic attack (TIA), and cerebral infarction without residual deficits: Secondary | ICD-10-CM

## 2023-05-28 DIAGNOSIS — N186 End stage renal disease: Secondary | ICD-10-CM | POA: Diagnosis not present

## 2023-05-28 DIAGNOSIS — I10 Essential (primary) hypertension: Secondary | ICD-10-CM | POA: Diagnosis not present

## 2023-05-28 DIAGNOSIS — R404 Transient alteration of awareness: Secondary | ICD-10-CM | POA: Diagnosis not present

## 2023-05-28 DIAGNOSIS — Z800 Family history of malignant neoplasm of digestive organs: Secondary | ICD-10-CM

## 2023-05-28 DIAGNOSIS — R590 Localized enlarged lymph nodes: Secondary | ICD-10-CM | POA: Diagnosis not present

## 2023-05-28 DIAGNOSIS — R7989 Other specified abnormal findings of blood chemistry: Secondary | ICD-10-CM | POA: Diagnosis present

## 2023-05-28 DIAGNOSIS — I4430 Unspecified atrioventricular block: Secondary | ICD-10-CM | POA: Diagnosis not present

## 2023-05-28 DIAGNOSIS — E119 Type 2 diabetes mellitus without complications: Secondary | ICD-10-CM

## 2023-05-28 DIAGNOSIS — J9601 Acute respiratory failure with hypoxia: Secondary | ICD-10-CM

## 2023-05-28 DIAGNOSIS — I132 Hypertensive heart and chronic kidney disease with heart failure and with stage 5 chronic kidney disease, or end stage renal disease: Secondary | ICD-10-CM | POA: Diagnosis present

## 2023-05-28 DIAGNOSIS — Z91040 Latex allergy status: Secondary | ICD-10-CM

## 2023-05-28 DIAGNOSIS — R918 Other nonspecific abnormal finding of lung field: Secondary | ICD-10-CM | POA: Diagnosis not present

## 2023-05-28 DIAGNOSIS — Z7901 Long term (current) use of anticoagulants: Secondary | ICD-10-CM

## 2023-05-28 DIAGNOSIS — J984 Other disorders of lung: Secondary | ICD-10-CM | POA: Diagnosis not present

## 2023-05-28 DIAGNOSIS — I2510 Atherosclerotic heart disease of native coronary artery without angina pectoris: Secondary | ICD-10-CM | POA: Diagnosis present

## 2023-05-28 DIAGNOSIS — R6520 Severe sepsis without septic shock: Secondary | ICD-10-CM | POA: Diagnosis present

## 2023-05-28 DIAGNOSIS — Z6832 Body mass index (BMI) 32.0-32.9, adult: Secondary | ICD-10-CM

## 2023-05-28 DIAGNOSIS — I482 Chronic atrial fibrillation, unspecified: Secondary | ICD-10-CM | POA: Diagnosis not present

## 2023-05-28 DIAGNOSIS — R1084 Generalized abdominal pain: Secondary | ICD-10-CM | POA: Diagnosis not present

## 2023-05-28 DIAGNOSIS — R509 Fever, unspecified: Secondary | ICD-10-CM | POA: Diagnosis not present

## 2023-05-28 DIAGNOSIS — Z992 Dependence on renal dialysis: Secondary | ICD-10-CM

## 2023-05-28 DIAGNOSIS — E785 Hyperlipidemia, unspecified: Secondary | ICD-10-CM | POA: Diagnosis present

## 2023-05-28 DIAGNOSIS — N189 Chronic kidney disease, unspecified: Secondary | ICD-10-CM | POA: Diagnosis present

## 2023-05-28 DIAGNOSIS — I5030 Unspecified diastolic (congestive) heart failure: Secondary | ICD-10-CM | POA: Diagnosis present

## 2023-05-28 DIAGNOSIS — Z8042 Family history of malignant neoplasm of prostate: Secondary | ICD-10-CM

## 2023-05-28 DIAGNOSIS — I739 Peripheral vascular disease, unspecified: Secondary | ICD-10-CM | POA: Diagnosis present

## 2023-05-28 DIAGNOSIS — G9340 Encephalopathy, unspecified: Secondary | ICD-10-CM

## 2023-05-28 DIAGNOSIS — Z833 Family history of diabetes mellitus: Secondary | ICD-10-CM

## 2023-05-28 HISTORY — DX: End stage renal disease: N18.6

## 2023-05-28 HISTORY — DX: Dependence on renal dialysis: Z99.2

## 2023-05-28 LAB — CBC WITH DIFFERENTIAL/PLATELET
Abs Immature Granulocytes: 0.27 10*3/uL — ABNORMAL HIGH (ref 0.00–0.07)
Basophils Absolute: 0 10*3/uL (ref 0.0–0.1)
Basophils Relative: 0 %
Eosinophils Absolute: 0 10*3/uL (ref 0.0–0.5)
Eosinophils Relative: 0 %
HCT: 32 % — ABNORMAL LOW (ref 39.0–52.0)
Hemoglobin: 10.3 g/dL — ABNORMAL LOW (ref 13.0–17.0)
Immature Granulocytes: 1 %
Lymphocytes Relative: 3 %
Lymphs Abs: 0.7 10*3/uL (ref 0.7–4.0)
MCH: 32.1 pg (ref 26.0–34.0)
MCHC: 32.2 g/dL (ref 30.0–36.0)
MCV: 99.7 fL (ref 80.0–100.0)
Monocytes Absolute: 1 10*3/uL (ref 0.1–1.0)
Monocytes Relative: 4 %
Neutro Abs: 21.5 10*3/uL — ABNORMAL HIGH (ref 1.7–7.7)
Neutrophils Relative %: 92 %
Platelets: 196 10*3/uL (ref 150–400)
RBC: 3.21 MIL/uL — ABNORMAL LOW (ref 4.22–5.81)
RDW: 13.9 % (ref 11.5–15.5)
WBC: 23.5 10*3/uL — ABNORMAL HIGH (ref 4.0–10.5)
nRBC: 0 % (ref 0.0–0.2)

## 2023-05-28 LAB — COMPREHENSIVE METABOLIC PANEL
ALT: 18 U/L (ref 0–44)
AST: 18 U/L (ref 15–41)
Albumin: 2.7 g/dL — ABNORMAL LOW (ref 3.5–5.0)
Alkaline Phosphatase: 97 U/L (ref 38–126)
Anion gap: 13 (ref 5–15)
BUN: 48 mg/dL — ABNORMAL HIGH (ref 8–23)
CO2: 25 mmol/L (ref 22–32)
Calcium: 8.2 mg/dL — ABNORMAL LOW (ref 8.9–10.3)
Chloride: 97 mmol/L — ABNORMAL LOW (ref 98–111)
Creatinine, Ser: 7.32 mg/dL — ABNORMAL HIGH (ref 0.61–1.24)
GFR, Estimated: 8 mL/min — ABNORMAL LOW (ref >60)
Glucose, Bld: 159 mg/dL — ABNORMAL HIGH (ref 70–99)
Potassium: 6.7 mmol/L (ref 3.5–5.1)
Sodium: 135 mmol/L (ref 135–145)
Total Bilirubin: 0.7 mg/dL (ref 0.3–1.2)
Total Protein: 6.1 g/dL — ABNORMAL LOW (ref 6.5–8.1)

## 2023-05-28 LAB — URINALYSIS, W/ REFLEX TO CULTURE (INFECTION SUSPECTED)
Bacteria, UA: NONE SEEN
Bilirubin Urine: NEGATIVE
Glucose, UA: 150 mg/dL — AB
Hgb urine dipstick: NEGATIVE
Ketones, ur: NEGATIVE mg/dL
Nitrite: NEGATIVE
Protein, ur: >=300 mg/dL — AB
Specific Gravity, Urine: 1.014 (ref 1.005–1.030)
pH: 8 (ref 5.0–8.0)

## 2023-05-28 LAB — I-STAT VENOUS BLOOD GAS, ED
Acid-Base Excess: 1 mmol/L (ref 0.0–2.0)
Bicarbonate: 25.8 mmol/L (ref 20.0–28.0)
Calcium, Ion: 1.02 mmol/L — ABNORMAL LOW (ref 1.15–1.40)
HCT: 32 % — ABNORMAL LOW (ref 39.0–52.0)
Hemoglobin: 10.9 g/dL — ABNORMAL LOW (ref 13.0–17.0)
O2 Saturation: 88 %
Potassium: 6.5 mmol/L (ref 3.5–5.1)
Sodium: 134 mmol/L — ABNORMAL LOW (ref 135–145)
TCO2: 27 mmol/L (ref 22–32)
pCO2, Ven: 42.6 mmHg — ABNORMAL LOW (ref 44–60)
pH, Ven: 7.39 (ref 7.25–7.43)
pO2, Ven: 56 mmHg — ABNORMAL HIGH (ref 32–45)

## 2023-05-28 LAB — I-STAT CHEM 8, ED
BUN: 43 mg/dL — ABNORMAL HIGH (ref 8–23)
Calcium, Ion: 1.02 mmol/L — ABNORMAL LOW (ref 1.15–1.40)
Chloride: 99 mmol/L (ref 98–111)
Creatinine, Ser: 7.9 mg/dL — ABNORMAL HIGH (ref 0.61–1.24)
Glucose, Bld: 156 mg/dL — ABNORMAL HIGH (ref 70–99)
HCT: 32 % — ABNORMAL LOW (ref 39.0–52.0)
Hemoglobin: 10.9 g/dL — ABNORMAL LOW (ref 13.0–17.0)
Potassium: 6.4 mmol/L (ref 3.5–5.1)
Sodium: 134 mmol/L — ABNORMAL LOW (ref 135–145)
TCO2: 24 mmol/L (ref 22–32)

## 2023-05-28 LAB — AMMONIA: Ammonia: 20 umol/L (ref 9–35)

## 2023-05-28 LAB — CBG MONITORING, ED
Glucose-Capillary: 145 mg/dL — ABNORMAL HIGH (ref 70–99)
Glucose-Capillary: 128 mg/dL — ABNORMAL HIGH (ref 70–99)
Glucose-Capillary: 126 mg/dL — ABNORMAL HIGH (ref 70–99)

## 2023-05-28 LAB — ETHANOL: Alcohol, Ethyl (B): <10 mg/dL (ref <10)

## 2023-05-28 LAB — I-STAT CG4 LACTIC ACID, ED
Lactic Acid, Venous: 1.9 mmol/L (ref 0.5–1.9)
Lactic Acid, Venous: 2.1 mmol/L (ref 0.5–1.9)

## 2023-05-28 LAB — RESP PANEL BY RT-PCR (RSV, FLU A&B, COVID)  RVPGX2
Influenza A by PCR: NEGATIVE
Influenza B by PCR: NEGATIVE
Resp Syncytial Virus by PCR: NEGATIVE
SARS Coronavirus 2 by RT PCR: NEGATIVE

## 2023-05-28 LAB — TSH: TSH: 0.771 u[IU]/mL (ref 0.350–4.500)

## 2023-05-28 LAB — TROPONIN I (HIGH SENSITIVITY)
Troponin I (High Sensitivity): 354 ng/L (ref <18)
Troponin I (High Sensitivity): 633 ng/L (ref <18)

## 2023-05-28 LAB — HEPATITIS B SURFACE ANTIGEN: Hepatitis B Surface Ag: NONREACTIVE

## 2023-05-28 LAB — LIPASE, BLOOD: Lipase: 19 U/L (ref 11–51)

## 2023-05-28 LAB — VITAMIN B12: Vitamin B-12: 411 pg/mL (ref 180–914)

## 2023-05-28 MED ORDER — ACETAMINOPHEN 650 MG RE SUPP
650.0000 mg | Freq: Once | RECTAL | Status: AC
  Administered 2023-05-28: 650 mg via RECTAL
  Filled 2023-05-28: qty 1

## 2023-05-28 MED ORDER — SODIUM BICARBONATE 8.4 % IV SOLN
50.0000 meq | Freq: Once | INTRAVENOUS | Status: AC
  Administered 2023-05-28: 50 meq via INTRAVENOUS
  Filled 2023-05-28: qty 50

## 2023-05-28 MED ORDER — SODIUM CHLORIDE 0.9% FLUSH: 3.0000 mL | INTRAVENOUS | Status: DC | PRN

## 2023-05-28 MED ORDER — SODIUM CHLORIDE 0.9 % IV SOLN: 250.0000 mL | INTRAVENOUS | Status: DC | PRN

## 2023-05-28 MED ORDER — SODIUM CHLORIDE 0.9 % IV SOLN
2.0000 g | Freq: Once | INTRAVENOUS | Status: AC
  Administered 2023-05-28: 2 g via INTRAVENOUS
  Filled 2023-05-28: qty 12.5

## 2023-05-28 MED ORDER — INSULIN ASPART 100 UNIT/ML IJ SOLN
0.0000 [IU] | Freq: Three times a day (TID) | INTRAMUSCULAR | Status: DC
  Administered 2023-05-30 – 2023-05-31 (×2): 1 [IU] via SUBCUTANEOUS

## 2023-05-28 MED ORDER — ONDANSETRON HCL 4 MG/2ML IJ SOLN: 4.0000 mg | Freq: Four times a day (QID) | INTRAMUSCULAR | Status: DC | PRN

## 2023-05-28 MED ORDER — ONDANSETRON HCL 4 MG PO TABS: 4.0000 mg | ORAL_TABLET | Freq: Four times a day (QID) | ORAL | Status: DC | PRN

## 2023-05-28 MED ORDER — METRONIDAZOLE 500 MG/100ML IV SOLN
500.0000 mg | Freq: Two times a day (BID) | INTRAVENOUS | Status: DC
  Administered 2023-05-28 – 2023-05-29 (×2): 500 mg via INTRAVENOUS
  Filled 2023-05-28 (×2): qty 100

## 2023-05-28 MED ORDER — CALCIUM GLUCONATE-NACL 1-0.675 GM/50ML-% IV SOLN
1.0000 g | Freq: Once | INTRAVENOUS | Status: AC
  Administered 2023-05-28: 1000 mg via INTRAVENOUS
  Filled 2023-05-28: qty 50

## 2023-05-28 MED ORDER — DEXTROSE 50 % IV SOLN
1.0000 | Freq: Once | INTRAVENOUS | Status: AC
  Administered 2023-05-28: 50 mL via INTRAVENOUS
  Filled 2023-05-28: qty 50

## 2023-05-28 MED ORDER — VANCOMYCIN HCL 2000 MG/400ML IV SOLN
2000.0000 mg | Freq: Once | INTRAVENOUS | Status: AC
  Administered 2023-05-28: 2000 mg via INTRAVENOUS
  Filled 2023-05-28: qty 400

## 2023-05-28 MED ORDER — SODIUM CHLORIDE 0.9 % IV BOLUS
500.0000 mL | Freq: Once | INTRAVENOUS | Status: AC
  Administered 2023-05-28: 500 mL via INTRAVENOUS

## 2023-05-28 MED ORDER — INSULIN ASPART 100 UNIT/ML IV SOLN
5.0000 [IU] | Freq: Once | INTRAVENOUS | Status: AC
  Administered 2023-05-28: 5 [IU] via INTRAVENOUS

## 2023-05-28 MED ORDER — METRONIDAZOLE 500 MG/100ML IV SOLN: 500.0000 mg | Freq: Once | INTRAVENOUS | Status: DC

## 2023-05-28 MED ORDER — SODIUM CHLORIDE 0.9% FLUSH
3.0000 mL | Freq: Two times a day (BID) | INTRAVENOUS | Status: DC
  Administered 2023-05-29 – 2023-05-31 (×4): 3 mL via INTRAVENOUS

## 2023-05-28 MED ORDER — ACETAMINOPHEN 650 MG RE SUPP: 650.0000 mg | Freq: Four times a day (QID) | RECTAL | Status: DC | PRN

## 2023-05-28 MED ORDER — CHLORHEXIDINE GLUCONATE CLOTH 2 % EX PADS
6.0000 | MEDICATED_PAD | Freq: Every day | CUTANEOUS | Status: DC
  Administered 2023-05-30 – 2023-05-31 (×2): 6 via TOPICAL

## 2023-05-28 MED ORDER — ACETAMINOPHEN 325 MG PO TABS
650.0000 mg | ORAL_TABLET | Freq: Four times a day (QID) | ORAL | Status: DC | PRN
  Administered 2023-05-28 – 2023-05-31 (×4): 650 mg via ORAL
  Filled 2023-05-28 (×4): qty 2

## 2023-05-28 MED ORDER — NICOTINE 21 MG/24HR TD PT24
21.0000 mg | MEDICATED_PATCH | Freq: Every day | TRANSDERMAL | Status: DC
  Administered 2023-05-29 – 2023-05-30 (×3): 21 mg via TRANSDERMAL
  Filled 2023-05-28 (×3): qty 1

## 2023-05-28 NOTE — Assessment & Plan Note (Signed)
Continue lipitor  ?

## 2023-05-28 NOTE — ED Notes (Signed)
Report received from Keenan Bachelor. Assumed care of pt at this time.

## 2023-05-28 NOTE — Progress Notes (Signed)
[facility] is following sepsis bundle.

## 2023-05-28 NOTE — Assessment & Plan Note (Addendum)
Transient confusion in setting of sepsis, likely infectious as source vs. Uremia due to missed dialysis  Will check metabolic labs Continue broad spectrum antibiotics  Head CT with no acute finding but previous remote CVA with no known hx of CVA. Echo 7/24 with no shunt. On eliquis/lipitor. Check brain MRI.  Dialysis per nephrology  Anti-pyretics

## 2023-05-28 NOTE — ED Triage Notes (Addendum)
Pt arrives via EMS from [facility]. Staff called ems due to patient's AMS. Upon arrival patient is febrile. He will open his eyes when you call out his name. Pt did not get dialysis this morning.

## 2023-05-28 NOTE — Assessment & Plan Note (Addendum)
Well controlled A1C of 6.1 in July 2024  Poor PO intake. Hold long acting for tonight  SSI and accuchecks qac/hs

## 2023-05-28 NOTE — Assessment & Plan Note (Signed)
Hgb 10.3, stable

## 2023-05-28 NOTE — ED Notes (Signed)
Wife Lawson Fiscal [phone] would like an update asap, she says he's due to dialysis today as well

## 2023-05-28 NOTE — Assessment & Plan Note (Addendum)
On HD TTSaturday. Last dialysis on Thursday 05/23/23 Hyperkalemia with no EKG changes Given calcium gluconate, sodium bicarb, novolog in ED Nephrology consulted and appreciate  HD per nephrology

## 2023-05-28 NOTE — Assessment & Plan Note (Signed)
-  history of bilateral below-knee amputation -multiple peripheral interventions in the past -continue eliquis/lipitor

## 2023-05-28 NOTE — Assessment & Plan Note (Addendum)
-  Oxygen of 88% on RA on arrival and requiring 2L to maintain oxygenation  -CXR with possible pneumonia vs. Missed dialysis x 2 sessions  -Stable on 2L on [place] -venous pH wnl  -antibiotics/pneumonia work up -troponin mildly elevated, but no chest pain or acute EKG changes  -nephrology to see for HD -fentanyl patch removed (on oral narcotics and fentanyl patch. Will hold this for now, continue oral pain medication PRN )  -continue to wean oxygen as tolerated

## 2023-05-28 NOTE — Consult Note (Signed)
Renal Service Consult Note [facility]  [name] 05/28/2023 Harold Krabbe, MD Requesting Physician: Dr. Artis Flock  Reason for Consult:  HPI: The patient is a 67 y.o. year-old w/ PMH as below who presented to ED w/ feeling sick, fatigued and weak at his SNF. No f/c/s, no n/v/d. +coughing. No SOB. On presentation to the ED pt was confused. Last HD was 05/23/23. In the ED CXR showed patchy bilat airspace disease, poss pna. WBC 23K, trop 354, K+ 6.4, creat 7.32.  Pt was given IV vanc, cefepime. Also IV Ca gluc, insulin/ IV d50, IV sod bicarb and 500 cc bolus. Pt was admitted. We are asked to see for dialysis.   Pt seen in room. No c/o's at this time.   ROS - denies CP, no joint pain, no HA, no blurry vision, no rash, no diarrhea, no nausea/ vomiting, no dysuria, no difficulty voiding   Past Medical History  Past Medical History:  Diagnosis Date   Carotid artery occlusion 11/10/2010   LEFT CAROTID ENDARTERECTOMY   Complication of anesthesia    BP WENT UP AT [facility] "   COPD (chronic obstructive pulmonary disease) (HCC)    pt denies this dx as of 06/01/20 - no inhaler    Diabetes mellitus without complication (HCC)    Diverticulitis    Diverticulosis of colon (without mention of hemorrhage)    DJD (degenerative joint disease)    knees/hands/feet/back/neck   ESRD (end stage renal disease) on dialysis (HCC)    Fatty liver    Full dentures    GERD (gastroesophageal reflux disease)    H/O hiatal hernia    History of blood transfusion    with a past surical procedure per patient 06/01/20   Hyperlipidemia    Hypertension    Neuromuscular disorder (HCC)    peripheral neuropathy   Non-pressure chronic ulcer of other part of left foot limited to breakdown of skin (HCC) 11/12/2016   Osteomyelitis (HCC)    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 Atherectomy left popliteal artery July 2012.    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   Unstable angina (HCC) 09/16/2018   Wears glasses    Past Surgical History  Past Surgical History:  Procedure Laterality Date   ABDOMINAL AORTOGRAM W/LOWER EXTREMITY N/A 06/23/2021   Procedure: ABDOMINAL AORTOGRAM W/LOWER EXTREMITY;  Surgeon: Elder Negus, MD;  [facility];  Service: Cardiovascular;  Laterality: N/A;   AMPUTATION  11/05/2011   Procedure: AMPUTATION RAY;  Surgeon: Toni Arthurs, MD;  Location: [facility] OR;  Service: Orthopedics;  Laterality: Right;  Amputation of Right 4&5th Toes   AMPUTATION Left 11/26/2012   Procedure: AMPUTATION RAY;  Surgeon: Toni Arthurs, MD;  Location: [facility] OR;  Service: Orthopedics;  Laterality: Left;  fourth ray amputation   AMPUTATION Right 08/27/2014   Procedure: Transmetatarsal Amputation;  Surgeon: Nadara Mustard, MD;  Location: [facility] OR;  Service: Orthopedics;  Laterality: Right;   AMPUTATION Right 01/14/2015   Procedure: AMPUTATION BELOW KNEE;  Surgeon: Nadara Mustard, MD;  Location: [facility];  Service: Orthopedics;  Laterality: Right;   AMPUTATION Left 10/21/2015   Procedure: Left Foot 5th Ray Amputation;  Surgeon: Nadara Mustard, MD;  Location: [facility] OR;  Service: Orthopedics;  Laterality: Left;   AMPUTATION Left 07/04/2022   Procedure: LEFT BELOW KNEE AMPUTATION;  Surgeon: Nadara Mustard, MD;  Location: [facility] OR;  Service: Orthopedics;  Laterality: Left;   ANTERIOR FUSION CERVICAL SPINE  02/06/06   C4-5, C5-6, C6-7; SURGEON DR. MAX COHEN   AV FISTULA PLACEMENT Left 06/02/2020   Procedure: ARTERIOVENOUS (AV) FISTULA CREATION LEFT;  Surgeon: Maeola Harman, MD;  Location: [facility] OR;  Service: Vascular;  Laterality: Left;   BACK SURGERY     x 3   BASCILIC VEIN TRANSPOSITION Left 07/21/2020   Procedure: LEFT UPPER ARM ATERIOVENOUS SUPERFISTULALIZATION;  Surgeon: Maeola Harman, MD;  Location: [facility] OR;   Service: Vascular;  Laterality: Left;   BELOW KNEE LEG AMPUTATION Right    CARDIAC CATHETERIZATION  10/31/04   2009   CAROTID ENDARTERECTOMY  11/10/10   CAROTID ENDARTERECTOMY Left 11/10/2010   Subtotal occlusion of left internal carotid artery with left hemispheric transient ischemic attacks.   CAROTID STENT     CARPAL TUNNEL RELEASE Right 10/21/2013   Procedure: RIGHT CARPAL TUNNEL RELEASE;  Surgeon: Nicki Reaper, MD;  Location: [facility];  Service: Orthopedics;  Laterality: Right;   CHOLECYSTECTOMY     COLON SURGERY     COLONOSCOPY     COLOSTOMY REVERSAL  05/21/2018   ileostomy reversal   CYSTOSCOPY WITH STENT PLACEMENT Bilateral 01/13/2018   Procedure: CYSTOSCOPY WITH BILATERAL URETERAL CATHETER PLACEMENT;  Surgeon: Crist Fat, MD;  Location: [facility];  Service: Urology;  Laterality: Bilateral;   ESOPHAGEAL MANOMETRY Bilateral 07/19/2014   Procedure: ESOPHAGEAL MANOMETRY (EM);  Surgeon: Beverley Fiedler, MD;  Location: [facility];  Service: Gastroenterology;  Laterality: Bilateral;   EYE SURGERY Bilateral 2020   cataract   FEMORAL ARTERY STENT     x6   FINGER SURGERY     FOOT SURGERY  04/25/2016    EXCISION BASE 5TH METATARSAL AND PARTIAL CUBOID LEFT FOOT   HERNIA REPAIR     LEFT INGUINAL AND UMBILICAL REPAIRS   HERNIA REPAIR     I & D EXTREMITY Left 04/25/2016   Procedure: EXCISION BASE 5TH METATARSAL AND PARTIAL CUBOID LEFT FOOT;  Surgeon: Nadara Mustard, MD;  [facility];  Service: Orthopedics;  Laterality: Left;   ILEOSTOMY  01/13/2018   Procedure: ILEOSTOMY;  Surgeon: Berna Bue, MD;  Location: [facility];  Service: General;;   ILEOSTOMY CLOSURE N/A 05/21/2018   Procedure: ILEOSTOMY REVERSAL ERAS PATHWAY;  Surgeon: Berna Bue, MD;  Location: [facility];  Service: General;  Laterality: N/A;   IR RADIOLOGIST EVAL & MGMT  11/19/2017   IR RADIOLOGIST EVAL & MGMT  12/03/2017   IR RADIOLOGIST EVAL & MGMT  12/18/2017   JOINT REPLACEMENT Right 2001   Total knee    LAMINECTOMY     X 3 LUMBAR AND X 2 CERVICAL SPINE OPERATIONS   LAPAROSCOPIC CHOLECYSTECTOMY W/ CHOLANGIOGRAPHY  11/09/04   SURGEON DR. Caleen Essex   LEFT HEART CATH AND CORONARY ANGIOGRAPHY N/A 09/16/2018   Procedure: LEFT HEART CATH AND CORONARY ANGIOGRAPHY;  Surgeon: Elder Negus, MD;  [facility];  Service: Cardiovascular;  Laterality: N/A;   LEFT HEART CATHETERIZATION WITH CORONARY ANGIOGRAM N/A 10/29/2014   Procedure: LEFT HEART CATHETERIZATION WITH CORONARY ANGIOGRAM;  Surgeon: Pamella Pert, MD;  Location: [facility] CATH LAB;  Service: Cardiovascular;  Laterality: N/A;   LIGATION OF COMPETING BRANCHES OF ARTERIOVENOUS FISTULA Left 07/21/2020   Procedure: LIGATION OF COMPETING BRANCHES OF LEFT  UPPER ARM ARTERIOVENOUS FISTULA;  Surgeon: Maeola Harman, MD;  Location: [facility] OR;  Service: Vascular;  Laterality: Left;   LOWER EXTREMITY ANGIOGRAM N/A 03/19/2012   Procedure: LOWER EXTREMITY ANGIOGRAM;  Surgeon: Kathleene Hazel, MD;  Location: [facility] CATH LAB;  Service: Cardiovascular;  Laterality: N/A;   LOWER EXTREMITY ANGIOGRAPHY N/A 06/20/2021   Procedure: LOWER EXTREMITY ANGIOGRAPHY;  Surgeon: Elder Negus, MD;  [facility];  Service: Cardiovascular;  Laterality: N/A;   NECK SURGERY     PARTIAL COLECTOMY N/A 01/13/2018   Procedure: LAPAROSCOPIC ASSISTED   SIGMOID COLECTOMY ILEOSTOMY;  Surgeon: Berna Bue, MD;  Location: [facility];  Service: General;  Laterality: N/A;   PENILE PROSTHESIS IMPLANT  08/14/05   INFRAPUBIC INSERTION OF INFLATABLE PENILE PROSTHESIS; SURGEON DR. Logan Bores   PENILE PROSTHESIS IMPLANT     PERCUTANEOUS CORONARY STENT INTERVENTION (PCI-S) Right 10/29/2014   Procedure: PERCUTANEOUS CORONARY STENT INTERVENTION (PCI-S);  Surgeon: Pamella Pert, MD;  Location: [facility] CATH LAB;  Service: Cardiovascular;  Laterality: Right;   PERIPHERAL VASCULAR INTERVENTION Left 06/23/2021   Procedure: PERIPHERAL VASCULAR INTERVENTION;   Surgeon: Elder Negus, MD;  [facility];  Service: Cardiovascular;  Laterality: Left;   REMOVAL OF PENILE PROSTHESIS N/A 06/14/2021   Procedure: Removal of THREE piece inflatable penile prosthesis;  Surgeon: Crista Elliot, MD;  Location: [facility] OR;  Service: Urology;  Laterality: N/A;   SHOULDER ARTHROSCOPY     SPINE SURGERY     TOE AMPUTATION Left    TONSILLECTOMY     TOTAL KNEE ARTHROPLASTY  07/2002   RIGHT KNEE ; SURGEON  DR. [name] ALSO HAD ARTHROSCOPIC RIGHT KNEE IN  10/2001   TOTAL KNEE ARTHROPLASTY     ULNAR NERVE TRANSPOSITION Right 10/21/2013   Procedure: RIGHT ELBOW  ULNAR NERVE DECOMPRESSION;  Surgeon: Nicki Reaper, MD;  Location: [facility];  Service: Orthopedics;  Laterality: Right;   Family History  Family History  Problem Relation Age of Onset   Heart disease Father        Before age 92-  CAD, BPG   Diabetes Father        Amputation   Cancer Father        PROSTATE   Hyperlipidemia Father    Hypertension Father    Heart attack Father        Triple BPG   Varicose Veins Father    Cancer Sister        Breast   Hyperlipidemia Sister    Hypertension Sister    Heart attack Brother    Colon cancer Brother    Diabetes Brother    Heart disease Brother 49       A-Fib. Before age 48   Hyperlipidemia Brother    Hypertension Brother    Hypertension Son    Arthritis Other        GRANDMOTHER   Hypertension Other        OTHER FAMILY MEMBERS   Social History  reports that he has been smoking cigarettes. He has a 17.5 pack-year smoking history. He has never used smokeless tobacco. He reports that he does not currently use alcohol. He reports that he does not use drugs. Allergies  Allergies  Allergen Reactions   Contrast Media [Iodinated Contrast Media] Shortness Of Breath and Other (See Comments)    Difficulty breathing and altered mental status     Ivp Dye [Iodinated Contrast Media] Anaphylaxis, Shortness Of Breath and Other (  See  Comments)    Breathing problems, altered mental state    Adhesive [Tape] Rash and Other (See Comments)    Rash after 1 day of use Do not leave on longer then 3 days with out be changed   Latex Rash and Other (See Comments)    A severe rash appears after the first 24 hours of being placed   Home medications Prior to Admission medications   Medication Sig Start Date End Date Taking? Authorizing Provider  amiodarone (PACERONE) 200 MG tablet Take 1 tablet (200 mg total) by mouth daily. 10/29/22  Yes Yates Decamp, MD  amLODipine (NORVASC) 10 MG tablet Take 10 mg by mouth daily.   Yes [provider]  apixaban (ELIQUIS) 5 MG TABS tablet Take 1 tablet (5 mg total) by mouth 2 (two) times daily. 01/14/22 05/28/23 Yes Almon Hercules, MD  atorvastatin (LIPITOR) 40 MG tablet Take 40 mg by mouth at bedtime.    Yes [provider]  B Complex-C-Zn-Folic Acid (DIALYVITE/ZINC) TABS Take 1 tablet by mouth Every Tuesday,Thursday,and Saturday with dialysis. 10/04/21  Yes [provider]  docusate sodium (COLACE) 100 MG capsule Take 100 mg by mouth 2 (two) times daily.   Yes [provider]  famotidine (PEPCID) 20 MG tablet Take 20 mg by mouth every other day.   Yes [provider]  fentaNYL (DURAGESIC) 50 MCG/HR Place 1 patch onto the skin every 3 (three) days. 04/01/23  Yes [name], MD  folic acid (FOLVITE) 1 MG tablet Take 1 mg by mouth daily. 09/18/21  Yes [provider]  gabapentin (NEURONTIN) 100 MG capsule Take 100 mg by mouth 3 (three) times daily.   Yes [provider]  hydrALAZINE (APRESOLINE) 50 MG tablet Take 1 tablet (50 mg total) by mouth every 8 (eight) hours. 04/01/23  Yes [name], MD  Icosapent Ethyl 0.5 g CAPS Take 1 capsule by mouth daily.   Yes [provider]  insulin aspart (NOVOLOG FLEXPEN) 100 UNIT/ML FlexPen Inject 0-12 Units into the skin See admin instructions. Inject 4 units into the skin with meals in  addition to sliding scale as needed. Inject as per sliding scale: If 0 - 69 = 0 initiate hypoglycemia orders;  70 - 150 = 0;  151 - 200 = 2;  201 - 250 = 4;  251 - 300 = 6;  301 - 350 = 8;  351 - 400 = 10;  401 - 450 = 12.  If >400, give 12 units, recheck in 1 hour. If still >400, call MD/NP for further orders. Subcutaneously with meals in addition to scheduled 4 unites with meals.   Yes [provider]  insulin glargine (LANTUS) 100 UNIT/ML injection Inject 0.2 mLs (20 Units total) into the skin at bedtime. 06/28/22  Yes Zigmund Daniel., MD  isosorbide mononitrate (IMDUR) 60 MG 24 hr tablet Take 1 tablet (60 mg total) by mouth daily. 01/21/23 05/28/23 Yes Yates Decamp, MD  labetalol (NORMODYNE) 200 MG tablet Take 1 tablet (200 mg total) by mouth 2 (two) times daily. 04/01/23  Yes [name], MD  loratadine (CLARITIN) 10 MG tablet Take 10 mg by mouth daily.   Yes [provider]  metoCLOPramide (REGLAN) 5 MG tablet Take 1 tablet (5 mg total) by mouth 4 (four) times daily -  before meals and at bedtime. 01/14/22 05/28/23 Yes Almon Hercules, MD  midodrine (PROAMATINE) 10 MG tablet Take 10 mg by mouth daily as  needed (SBP less than 100.).   Yes [provider]  nitroGLYCERIN (NITROSTAT) 0.4 MG SL tablet Place 0.4 mg under the tongue every 5 (five) minutes as needed for chest pain. 01/06/22  Yes [provider]  ondansetron (ZOFRAN) 4 MG tablet Take 1 tablet (4 mg total) by mouth every 8 (eight) hours as needed for nausea or vomiting. 04/24/23  Yes Yates Decamp, MD  oxyCODONE-acetaminophen (PERCOCET/ROXICET) 5-325 MG tablet Take 1 tablet by mouth every 8 (eight) hours as needed for severe pain.   Yes [provider]  Polyvinyl Alcohol-Povidone PF 1.4-0.6 % SOLN Place 1 drop into both eyes 2 (two) times daily as needed (dry eyes).    Yes [provider]  rOPINIRole (REQUIP) 2 MG tablet Take 2 mg by mouth at bedtime.   Yes [provider]   senna-docusate (SENOKOT-S) 8.6-50 MG tablet Take 2 tablets by mouth at bedtime.   Yes [provider]  tamsulosin (FLOMAX) 0.4 MG CAPS capsule Take 0.4 mg by mouth daily.   Yes [provider]  Zinc Oxide (DESITIN EX) Apply topically See admin instructions. Apply to buttocks topically 2 times a day for dry skin.   Yes [provider]  fluconazole (DIFLUCAN) 150 MG tablet Take 150 mg by mouth daily. Patient not taking: Reported on 05/28/2023    [provider]  lidocaine-prilocaine (EMLA) cream Apply 1 application topically See admin instructions. Prior to Dialysis days Tuesday,Thursday and saturday 06/06/21   [provider]  NEEDLE, REUSABLE, 22 G 22G X 1-1/2" MISC 1 Units by Does not apply route as directed. For B12 IM inj 02/15/18   Elgergawy, Leana Roe, MD  Syringe/Needle, Disp, (SYRINGE 3CC/22GX1-1/2") 22G X 1-1/2" 3 ML MISC 1 Syringe by Does not apply route as directed. For b12 IM inj 02/15/18   Elgergawy, Leana Roe, MD     Vitals:   05/28/23 1515 05/28/23 1530 05/28/23 1536 05/28/23 1540  BP: (!) 133/30 (!) 124/48 (!) 104/92 (!) 104/92  Pulse: 70 69 69 68  Resp: 19  17   Temp:   (!) 101 F (38.3 C)   TempSrc:   Oral   SpO2: 95% 97% 97% 97%   Exam Gen alert, no distress No rash, cyanosis or gangrene Sclera anicteric, throat clear  No jvd or bruits Chest clear bilat to bases, no rales/ wheezing RRR no MRG, bilat LE amp Abd soft ntnd no mass or ascites +bs GU normal male MS open wound of R amputation, no drainage Ext mild bilat LE edema Neuro is alert, Ox 2 , nf    L AVF+bruit      Home meds include - amiodarone, norvasc 5 mg, eliquis, lipitor, colace, pepcid, fentanyl patch, gabapentin, hydralazine 50 tid, icosapent, insulin aspart/ glargine, imdur 60, labetalol, reglan, midodrine 10mg  prn, sl ntg, percocet, requip, flomax, prns/ vits/ supps     OP HD: [facility]   4h  450 /800  107.9kg  2/2 bath  L AVF  Heparin none - last  OP HD 9/14, post wt 115.7kg - venofer 100 mg three times per week thru 10/05 - hectorol 1 mcg IV three times per week - mircera 75 mcg q 2 wks, last 9/14, due 9/28 - came off 3-7kg over most of the last 3 wks    CXR 9/17 - IMPRESSION: Patchy bibasilar airspace disease greatest at the right lung base, which could reflect pneumonia given clinical presentation   Assessment/ Plan: Sepsis - w/ fevers, Reina Fuse, AMS. Cx's sent  and IV abx started per pmd. CXR w/ poss PNA. CT a/p pending. Per pmd.  Hyperkalemia - K+ 6.4 in ED, got temporizing measures in ED. Plan HD sometime overnight tonight.  Acute hypoxic resp failure - due to pna vs pulm edema.  ESRD - on HD TTS. Missed 1 HD last week on 9/12. Plan is for HD tonight on schedule.  BP / volume - at least 7kg over dry wt. BP's low, some improvement w/ IV abx and IVF's. CXR may be c/w early edema. Plan HD tonight, get vol off as tolerated. Holding home BP lowering meds x 4 due to hypotension.   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 Follow.  MBD ckd - CCa in range, will add on phos. Follow.  Chronic pain - on fentanyl patch and prn percocet at home.  AMS - multifactorial H/o CAD s/p PCI's DM2 on insulin PAF - on eliquis and amiodarone      Vinson Moselle  MD [facility] 05/28/2023, 5:00 PM  Recent Labs  Lab 05/28/23 1243 05/28/23 1317  HGB 10.3* 10.9*  10.9*  ALBUMIN 2.7*  --   CALCIUM 8.2*  --   CREATININE 7.32* 7.90*  K 6.7* 6.5*  6.4*   Inpatient medications:  insulin aspart  0-6 Units Subcutaneous TID WC    metronidazole

## 2023-05-28 NOTE — H&P (Signed)
History and Physical    Patient: [name] NWG:[id number] DOB: May 28, 1956 DOA: 05/28/2023 DOS: the patient was seen and examined on 05/28/2023 PCP: Lonie Peak, PA-C  Patient coming from: SN[facility] - [facility]    Chief Complaint: confused/concern for infection   HPI: [name] is a 67 y.o. male with medical history significant of ESRD (dialysis TTSat, last had on Thursday), T2DM, CAD, HTN, HLD, GERD,  PAD sp bilateral BKA multiple peripheral interventions in the past,   severe diabetic retinopathy,, right carotid endarterectomy in 2012 , OSA Unable to tolerate CPAP, chronic back pain and neck and back surgery in past and on chronic pain medications, paroxysmal atrial fibrillation on Eliquis, GERD who presented to ED with concerns for AMS and infection at his SNF. He tells me he just felt sick. He has been more fatigued/weaker. Denies any fever/chills, N/V/D. He was coughing when he came in, but tells me he hasn't felt short of breath or been coughing.   Per his wife he has felt bad for the past few days, but  he couldn't put his finger on it. He started to shake around 7pm last night and went to bed around 9pm.  This AM the nurse told her he did not get up at his usual time and at 11am he was lethargic and they couldn't get him up to his chair. He missed dialysis this past Saturday. Last dialysis was Thursday was 05/23/23. She also states he has had mild cough, not productive.    Denies any fever/chills, vision changes/headaches, chest pain or palpitations, shortness of breath or cough, abdominal pain, N/V/D.    He does smoke, about 1 PPD. He does not drink alcohol.   ER Course:  vitals: temp: 101.3, bp: 148/63, HR: 64, RR: 15, oxygen: 88%RA Pertinent labs: wbc: 23.5, hgb: 10.3, troponin 354>   potassium: 6.7, BUN: 48, creatinine: 7.32, lactic acid: 1.9>2.1  CXR: Patchy bibasilar airspace disease greatest at the right lung base, which could reflect pneumonia given clinical  presentation. In ED: code sepsis. Given vanc, cefepime in ED. Given calcium gluconate, novolog, sodium bicarb and 500cc bolus. BC obtained. Nephrology consulted.     Review of Systems: As mentioned in the history of present illness. All other systems reviewed and are negative. Past Medical History:  Diagnosis Date   Carotid artery occlusion 11/10/2010   LEFT CAROTID ENDARTERECTOMY   Complication of anesthesia    BP WENT UP AT [facility] "   COPD (chronic obstructive pulmonary disease) (HCC)    pt denies this dx as of 06/01/20 - no inhaler    Diabetes mellitus without complication (HCC)    Diverticulitis    Diverticulosis of colon (without mention of hemorrhage)    DJD (degenerative joint disease)    knees/hands/feet/back/neck   ESRD (end stage renal disease) on dialysis (HCC)    Fatty liver    Full dentures    GERD (gastroesophageal reflux disease)    H/O hiatal hernia    History of blood transfusion    with a past surical procedure per patient 06/01/20   Hyperlipidemia    Hypertension    Neuromuscular disorder (HCC)    peripheral neuropathy   Non-pressure chronic ulcer of other part of left foot limited to breakdown of skin (HCC) 11/12/2016   Osteomyelitis (HCC)    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 Atherectomy left popliteal artery July 2012.    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   Unstable angina (HCC) 09/16/2018   Wears glasses    Past Surgical History:  Procedure Laterality Date   ABDOMINAL AORTOGRAM W/LOWER EXTREMITY N/A 06/23/2021   Procedure: ABDOMINAL AORTOGRAM W/LOWER EXTREMITY;  Surgeon: Elder Negus, MD;  [facility]: [facility];  Service: Cardiovascular;  Laterality: N/A;   AMPUTATION  11/05/2011   Procedure: AMPUTATION RAY;  Surgeon: Toni Arthurs, MD;  Location: [facility];   Service: Orthopedics;  Laterality: Right;  Amputation of Right 4&5th Toes   AMPUTATION Left 11/26/2012   Procedure: AMPUTATION RAY;  Surgeon: Toni Arthurs, MD;  Location: [facility];  Service: Orthopedics;  Laterality: Left;  fourth ray amputation   AMPUTATION Right 08/27/2014   Procedure: Transmetatarsal Amputation;  Surgeon: Nadara Mustard, MD;  Location: [facility] OR;  Service: Orthopedics;  Laterality: Right;   AMPUTATION Right 01/14/2015   Procedure: AMPUTATION BELOW KNEE;  Surgeon: Nadara Mustard, MD;  Location: [facility] OR;  Service: Orthopedics;  Laterality: Right;   AMPUTATION Left 10/21/2015   Procedure: Left Foot 5th Ray Amputation;  Surgeon: Nadara Mustard, MD;  Location: [facility] OR;  Service: Orthopedics;  Laterality: Left;   AMPUTATION Left 07/04/2022   Procedure: LEFT BELOW KNEE AMPUTATION;  Surgeon: Nadara Mustard, MD;  Location: [facility] OR;  Service: Orthopedics;  Laterality: Left;   ANTERIOR FUSION CERVICAL SPINE  02/06/06   C4-5, C5-6, C6-7; SURGEON DR. MAX COHEN   AV FISTULA PLACEMENT Left 06/02/2020   Procedure: ARTERIOVENOUS (AV) FISTULA CREATION LEFT;  Surgeon: Maeola Harman, MD;  Location[facility];  Service: Vascular;  Laterality: Left;   BACK SURGERY     x 3   BASCILIC VEIN TRANSPOSITION Left 07/21/2020   Procedure: LEFT UPPER ARM ATERIOVENOUS SUPERFISTULALIZATION;  Surgeon: Maeola Harman, MD;  Location: [facility] OR;  Service: Vascular;  Laterality: Left;   BELOW KNEE LEG AMPUTATION Right    CARDIAC CATHETERIZATION  10/31/04   2009   CAROTID ENDARTERECTOMY  11/10/10   CAROTID ENDARTERECTOMY Left 11/10/2010   Subtotal occlusion of left internal carotid artery with left hemispheric transient ischemic attacks.   CAROTID STENT     CARPAL TUNNEL RELEASE Right 10/21/2013   Procedure: RIGHT CARPAL TUNNEL RELEASE;  Surgeon: Nicki Reaper, MD;  Location: [facility];  Service: Orthopedics;  Laterality: Right;   CHOLECYSTECTOMY     COLON SURGERY     COLONOSCOPY     COLOSTOMY REVERSAL   05/21/2018   ileostomy reversal   CYSTOSCOPY WITH STENT PLACEMENT Bilateral 01/13/2018   Procedure: CYSTOSCOPY WITH BILATERAL URETERAL CATHETER PLACEMENT;  Surgeon: Crist Fat, MD;  Location: [facility];  Service: Urology;  Laterality: Bilateral;   ESOPHAGEAL MANOMETRY Bilateral 07/19/2014   Procedure: ESOPHAGEAL MANOMETRY (EM);  Surgeon: Beverley Fiedler, MD;  Location: [facility];  Service: Gastroenterology;  Laterality: Bilateral;   EYE SURGERY Bilateral 2020   cataract   FEMORAL ARTERY STENT     x6   FINGER SURGERY     FOOT SURGERY  04/25/2016    EXCISION BASE 5TH METATARSAL AND PARTIAL CUBOID LEFT FOOT   HERNIA REPAIR     LEFT INGUINAL AND UMBILICAL REPAIRS   HERNIA REPAIR     I & D EXTREMITY Left 04/25/2016   Procedure: EXCISION BASE 5TH METATARSAL AND PARTIAL CUBOID LEFT FOOT;  Surgeon: [name]  Lajoyce Corners, MD;  Location: [facility];  Service: Orthopedics;  Laterality: Left;   ILEOSTOMY  01/13/2018   Procedure: ILEOSTOMY;  Surgeon: Berna Bue, MD;  Location: [facility];  Service: General;;   ILEOSTOMY CLOSURE N/A 05/21/2018   Procedure: ILEOSTOMY REVERSAL ERAS PATHWAY;  Surgeon: Berna Bue, MD;  Location: [facility];  Service: General;  Laterality: N/A;   IR RADIOLOGIST EVAL & MGMT  11/19/2017   IR RADIOLOGIST EVAL & MGMT  12/03/2017   IR RADIOLOGIST EVAL & MGMT  12/18/2017   JOINT REPLACEMENT Right 2001   Total knee   LAMINECTOMY     X 3 LUMBAR AND X 2 CERVICAL SPINE OPERATIONS   LAPAROSCOPIC CHOLECYSTECTOMY W/ CHOLANGIOGRAPHY  11/09/04   SURGEON DR. Caleen Essex   LEFT HEART CATH AND CORONARY ANGIOGRAPHY N/A 09/16/2018   Procedure: LEFT HEART CATH AND CORONARY ANGIOGRAPHY;  Surgeon: Elder Negus, MD;  [facility];  Service: Cardiovascular;  Laterality: N/A;   LEFT HEART CATHETERIZATION WITH CORONARY ANGIOGRAM N/A 10/29/2014   Procedure: LEFT HEART CATHETERIZATION WITH CORONARY ANGIOGRAM;  Surgeon: Pamella Pert, MD;  Location: [facility] CATH LAB;  Service:  Cardiovascular;  Laterality: N/A;   LIGATION OF COMPETING BRANCHES OF ARTERIOVENOUS FISTULA Left 07/21/2020   Procedure: LIGATION OF COMPETING BRANCHES OF LEFT UPPER ARM ARTERIOVENOUS FISTULA;  Surgeon: Maeola Harman, MD;  Location: [facility] OR;  Service: Vascular;  Laterality: Left;   LOWER EXTREMITY ANGIOGRAM N/A 03/19/2012   Procedure: LOWER EXTREMITY ANGIOGRAM;  Surgeon: Kathleene Hazel, MD;  Location: [facility] CATH LAB;  Service: Cardiovascular;  Laterality: N/A;   LOWER EXTREMITY ANGIOGRAPHY N/A 06/20/2021   Procedure: LOWER EXTREMITY ANGIOGRAPHY;  Surgeon: Elder Negus, MD;  [facility];  Service: Cardiovascular;  Laterality: N/A;   NECK SURGERY     PARTIAL COLECTOMY N/A 01/13/2018   Procedure: LAPAROSCOPIC ASSISTED   SIGMOID COLECTOMY ILEOSTOMY;  Surgeon: Berna Bue, MD;  Location: [facility];  Service: General;  Laterality: N/A;   PENILE PROSTHESIS IMPLANT  08/14/05   INFRAPUBIC INSERTION OF INFLATABLE PENILE PROSTHESIS; SURGEON DR. Logan Bores   PENILE PROSTHESIS IMPLANT     PERCUTANEOUS CORONARY STENT INTERVENTION (PCI-S) Right 10/29/2014   Procedure: PERCUTANEOUS CORONARY STENT INTERVENTION (PCI-S);  Surgeon: Pamella Pert, MD;  Location: [facility] CATH LAB;  Service: Cardiovascular;  Laterality: Right;   PERIPHERAL VASCULAR INTERVENTION Left 06/23/2021   Procedure: PERIPHERAL VASCULAR INTERVENTION;  Surgeon: Elder Negus, MD;  [facility];  Service: Cardiovascular;  Laterality: Left;   REMOVAL OF PENILE PROSTHESIS N/A 06/14/2021   Procedure: Removal of THREE piece inflatable penile prosthesis;  Surgeon: Crista Elliot, MD;  Location: [facility] OR;  Service: Urology;  Laterality: N/A;   SHOULDER ARTHROSCOPY     SPINE SURGERY     TOE AMPUTATION Left    TONSILLECTOMY     TOTAL KNEE ARTHROPLASTY  07/2002   RIGHT KNEE ; SURGEON  DR. [name] ALSO HAD ARTHROSCOPIC RIGHT KNEE IN  10/2001   TOTAL KNEE ARTHROPLASTY     ULNAR NERVE TRANSPOSITION  Right 10/21/2013   Procedure: RIGHT ELBOW  ULNAR NERVE DECOMPRESSION;  Surgeon: Nicki Reaper, MD;  Location: [facility];  Service: Orthopedics;  Laterality: Right;   Social History:  reports that he has been smoking cigarettes. He has a 17.5 pack-year smoking history. He has never used smokeless tobacco. He reports that he does not currently use alcohol. He reports that he does not use drugs.  Allergies  Allergen Reactions   Contrast Media [Iodinated Contrast Media] Shortness Of Breath and Other (See Comments)    Difficulty breathing and altered mental status     Ivp Dye [Iodinated Contrast Media] Anaphylaxis, Shortness Of Breath and Other (See Comments)    Breathing problems, altered mental state    Adhesive [Tape] Rash and Other (See Comments)    Rash after 1 day of use Do not leave on longer then 3 days with out be changed   Latex Rash and Other (See Comments)    A severe rash appears after the first 24 hours of being placed    Family History  Problem Relation Age of Onset   Heart disease Father        Before age 65-  CAD, BPG   Diabetes Father        Amputation   Cancer Father        PROSTATE   Hyperlipidemia Father    Hypertension Father    Heart attack Father        Triple BPG   Varicose Veins Father    Cancer Sister        Breast   Hyperlipidemia Sister    Hypertension Sister    Heart attack Brother    Colon cancer Brother    Diabetes Brother    Heart disease Brother 69       A-Fib. Before age 65   Hyperlipidemia Brother    Hypertension Brother    Hypertension Son    Arthritis Other        GRANDMOTHER   Hypertension Other        OTHER FAMILY MEMBERS    Prior to Admission medications   Medication Sig Start Date End Date Taking? Authorizing Provider  amiodarone (PACERONE) 200 MG tablet Take 1 tablet (200 mg total) by mouth daily. 10/29/22  Yes Yates Decamp, MD  amLODipine (NORVASC) 10 MG tablet Take 10 mg by mouth daily.   Yes [provider]  apixaban (ELIQUIS) 5 MG TABS tablet Take 1 tablet (5 mg total) by mouth 2 (two) times daily. 01/14/22 05/28/23 Yes Almon Hercules, MD  atorvastatin (LIPITOR) 40 MG tablet Take 40 mg by mouth at bedtime.    Yes [provider]  B Complex-C-Zn-Folic Acid (DIALYVITE/ZINC) TABS Take 1 tablet by mouth Every Tuesday,Thursday,and Saturday with dialysis. 10/04/21  Yes [provider]  docusate sodium (COLACE) 100 MG capsule Take 100 mg by mouth 2 (two) times daily.   Yes [provider]  famotidine (PEPCID) 20 MG tablet Take 20 mg by mouth every other day.   Yes [provider]  fentaNYL (DURAGESIC) 50 MCG/HR Place 1 patch onto the skin every 3 (three) days. 04/01/23  Yes [name], MD  folic acid (FOLVITE) 1 MG tablet Take 1 mg by mouth daily. 09/18/21  Yes [provider]  gabapentin (NEURONTIN) 100 MG capsule Take 100 mg by mouth 3 (three) times daily.   Yes [provider]  hydrALAZINE (APRESOLINE) 50 MG tablet Take 1 tablet (50 mg total) by mouth every 8 (eight) hours. 04/01/23  Yes [name], MD  Icosapent Ethyl 0.5 g CAPS Take 1 capsule by mouth daily.   Yes [provider]  insulin aspart (NOVOLOG FLEXPEN) 100 UNIT/ML FlexPen Inject 0-12 Units into the skin See admin instructions. Inject 4 units into the skin with meals in addition to sliding scale as needed. Inject as per sliding scale: If 0 - 69 =  0 initiate hypoglycemia orders;  70 - 150 = 0;  151 - 200 = 2;  201 - 250 = 4;  251 - 300 = 6;  301 - 350 = 8;  351 - 400 = 10;  401 - 450 = 12.  If >400, give 12 units, recheck in 1 hour. If still >400, call MD/NP for further orders. Subcutaneously with meals in addition to scheduled 4 unites with meals.   Yes [provider]  insulin glargine (LANTUS) 100 UNIT/ML injection Inject 0.2 mLs (20 Units total) into the skin at bedtime. 06/28/22  Yes Zigmund Daniel., MD  isosorbide mononitrate (IMDUR) 60 MG  24 hr tablet Take 1 tablet (60 mg total) by mouth daily. 01/21/23 05/28/23 Yes Yates Decamp, MD  labetalol (NORMODYNE) 200 MG tablet Take 1 tablet (200 mg total) by mouth 2 (two) times daily. 04/01/23  Yes [name], MD  loratadine (CLARITIN) 10 MG tablet Take 10 mg by mouth daily.   Yes [provider]  metoCLOPramide (REGLAN) 5 MG tablet Take 1 tablet (5 mg total) by mouth 4 (four) times daily -  before meals and at bedtime. 01/14/22 05/28/23 Yes Almon Hercules, MD  midodrine (PROAMATINE) 10 MG tablet Take 10 mg by mouth daily as needed (SBP less than 100.).   Yes [provider]  nitroGLYCERIN (NITROSTAT) 0.4 MG SL tablet Place 0.4 mg under the tongue every 5 (five) minutes as needed for chest pain. 01/06/22  Yes [provider]  ondansetron (ZOFRAN) 4 MG tablet Take 1 tablet (4 mg total) by mouth every 8 (eight) hours as needed for nausea or vomiting. 04/24/23  Yes Yates Decamp, MD  oxyCODONE-acetaminophen (PERCOCET/ROXICET) 5-325 MG tablet Take 1 tablet by mouth every 8 (eight) hours as needed for severe pain.   Yes [provider]  Polyvinyl Alcohol-Povidone PF 1.4-0.6 % SOLN Place 1 drop into both eyes 2 (two) times daily as needed (dry eyes).    Yes [provider]  rOPINIRole (REQUIP) 2 MG tablet Take 2 mg by mouth at bedtime.   Yes [provider]  senna-docusate (SENOKOT-S) 8.6-50 MG tablet Take 2 tablets by mouth at bedtime.   Yes [provider]  tamsulosin (FLOMAX) 0.4 MG CAPS capsule Take 0.4 mg by mouth daily.   Yes [provider]  Zinc Oxide (DESITIN EX) Apply topically See admin instructions. Apply to buttocks topically 2 times a day for dry skin.   Yes [provider]  fluconazole (DIFLUCAN) 150 MG tablet Take 150 mg by mouth daily. Patient not taking: Reported on 05/28/2023    [provider]  lidocaine-prilocaine (EMLA) cream Apply 1 application topically See admin instructions. Prior to Dialysis  days Tuesday,Thursday and saturday 06/06/21   [provider]  NEEDLE, REUSABLE, 22 G 22G X 1-1/2" MISC 1 Units by Does not apply route as directed. For B12 IM inj 02/15/18   Elgergawy, Leana Roe, MD  Syringe/Needle, Disp, (SYRINGE 3CC/22GX1-1/2") 22G X 1-1/2" 3 ML MISC 1 Syringe by Does not apply route as directed. For b12 IM inj 02/15/18   Elgergawy, Leana Roe, MD    Physical Exam: Vitals:   05/28/23 1530 05/28/23 1536 05/28/23 1540 05/28/23 1805  BP: (!) 124/48 (!) 104/92 (!) 104/92   Pulse: 69 69 68   Resp:  17    Temp:  (!) 101 F (38.3 C)  99.8 F (37.7 C)  TempSrc:  Oral  Oral  SpO2: 97% 97% 97%  General:  Appears calm and comfortable and is in NAD Eyes:  PERRL, EOMI, normal lids, iris ENT:  grossly normal hearing, lips & tongue, dry mucous membranes; appropriate dentition Neck:  no LAD, masses or thyromegaly; no carotid bruits Cardiovascular:  RRR, no m/r/g. Bilateral amputation  Respiratory:   CTA bilaterally with no wheezes/rales/rhonchi.  Normal respiratory effort. Abdomen:  soft, NT, ND, NABS Back:   normal alignment, no CVAT Skin:  no rash or induration seen on limited exam. Open wound of right amputation stub. No erythema or drainage. Mild decubitus ulcer, but scabbed over.  Musculoskeletal:  grossly normal tone BUE  Psychiatric:  grossly normal mood and affect, speech fluent and appropriate, Aox2.  Neurologic:  CN 2-12 grossly intact, moves all extremities in coordinated fashion, sensation intact   Radiological Exams on Admission: Independently reviewed - see discussion in A/P where applicable  CT CHEST ABDOMEN PELVIS WO CONTRAST  Result Date: 05/28/2023 CLINICAL DATA:  Abdominal pain. Decreased responsiveness. Fever. Dialysis patient. EXAM: CT CHEST, ABDOMEN AND PELVIS WITHOUT CONTRAST TECHNIQUE: Multidetector CT imaging of the chest, abdomen and pelvis was performed following the standard protocol without IV contrast. RADIATION DOSE REDUCTION: This exam was  performed according to the departmental dose-optimization program which includes automated exposure control, adjustment of the mA and/or kV according to patient size and/or use of iterative reconstruction technique. COMPARISON:  Abdominopelvic CT 07/03/2022 FINDINGS: CT CHEST FINDINGS Cardiovascular: Borderline cardiomegaly. There are coronary artery calcifications. No pericardial effusion. Moderate aortic atherosclerosis. No aortic aneurysm. Mediastinum/Nodes: Scattered small mediastinal lymph nodes, not enlarged by size criteria. Assessment for hilar adenopathy is limited on this unenhanced exam. There is no esophageal wall thickening. Lungs/Pleura: Dependent atelectasis in the lung bases and perifissural upper lobes. Minimal retained mucus and debris within the distal trachea and central airways. Trace pleural thickening without significant effusion. Musculoskeletal: Thoracic spondylosis with spurring. Remote posterior left rib fractures. There are no acute or suspicious osseous abnormalities. No obvious musculoskeletal infection on this unenhanced exam. There is mild generalized subcutaneous edema. CT ABDOMEN PELVIS FINDINGS Hepatobiliary: Cholecystectomy. No definite biliary dilatation, mild motion artifact. Unremarkable unenhanced appearance of the liver. Pancreas: Parenchymal atrophy. No ductal dilatation or inflammation. The known cystic structure in the pancreatic body measuring 13 mm is not well-defined on the current exam. This is grossly stable. Spleen: Enlarged spanning 15 cm cranial caudal. Adrenals/Urinary Tract: No adrenal nodule. Bilateral renal parenchymal atrophy. There is perinephric stranding. No hydronephrosis. No definite intrarenal calculi. Both ureters are decompressed. Urinary bladder is only minimally distended. Stomach/Bowel: The stomach is decompressed. There is a small duodenal diverticulum. No bowel obstruction or inflammation. Colonic diverticulosis without diverticulitis. Moderate  colonic stool burden. The appendix is not definitively seen. Vascular/Lymphatic: Advanced aortic and branch atherosclerosis. There multiple small retroperitoneal and periportal nodes, not enlarged by size criteria. There is an enlarged right external iliac node at 14 mm. This is slightly enlarged from prior exam. Reproductive: Prostate is unremarkable. Other: No free air or ascites. Postsurgical change of the anterior abdominal wall. Small fat containing supraumbilical ventral abdominal wall hernia. Suspect scarring in the right lower abdomen. There is mild subcutaneous edema of the right anterior upper thigh, series 3, image 25. No soft tissue gas. No focal fluid collection. Musculoskeletal: L4-S1 lumbar fusion hardware. Degenerative change in the upper lumbar spine. No acute osseous findings. No findings to suggest musculoskeletal infection on this unenhanced exam. IMPRESSION: 1. Mild subcutaneous edema of the right anterior upper thigh, can be seen with cellulitis. No soft tissue gas or  focal fluid collection. 2. No other acute findings or explanation for fever. 3. Minimal retained mucus and debris within the distal trachea and central airways, can be seen with bronchitis or aspiration. Minor dependent atelectasis in both lungs. 4. Mildly enlarged right external iliac node at 14 mm, slightly enlarged from prior exam is nonspecific, likely reactive. 5. Colonic diverticulosis without diverticulitis. 6. Splenomegaly. 7. The known cystic structure in the pancreatic body measuring 13 mm is not well-defined on the current exam, however grossly stable from prior imaging. Given stability over multiple exams, no specific imaging follow-up is needed. Aortic Atherosclerosis (ICD10-I70.0). Electronically Signed   By: Narda Rutherford M.D.   On: 05/28/2023 18:11   CT Head Wo Contrast  Result Date: 05/28/2023 CLINICAL DATA:  Altered mental status EXAM: CT HEAD WITHOUT CONTRAST TECHNIQUE: Contiguous axial images were  obtained from the base of the skull through the vertex without intravenous contrast. RADIATION DOSE REDUCTION: This exam was performed according to the departmental dose-optimization program which includes automated exposure control, adjustment of the mA and/or kV according to patient size and/or use of iterative reconstruction technique. COMPARISON:  04/02/2023 FINDINGS: Brain: Periventricular white matter and corona radiata hypodensities favor chronic ischemic microvascular white matter disease. Suspected remote prior strokes in the left frontal lobe and right parietal vertex, appearance not changed from 04/02/2023. Otherwise, the brainstem, cerebellum, cerebral peduncles, thalamus, basal ganglia, basilar cisterns, and ventricular system appear within normal limits. No intracranial hemorrhage, mass lesion, or acute CVA. Vascular: There is atherosclerotic calcification of the cavernous carotid arteries bilaterally. Skull: Unremarkable Sinuses/Orbits: Unremarkable Other: No supplemental non-categorized findings. IMPRESSION: 1. No acute intracranial findings. 2. Periventricular white matter and corona radiata hypodensities favor chronic ischemic microvascular white matter disease. 3. Suspected remote prior strokes in the left frontal lobe and right parietal vertex. 4. Atherosclerosis. Electronically Signed   By: Gaylyn Rong M.D.   On: 05/28/2023 18:03   DG Chest Port 1 View  Result Date: 05/28/2023 CLINICAL DATA:  Fever, altered level of consciousness EXAM: PORTABLE CHEST 1 VIEW COMPARISON:  04/02/2023 FINDINGS: Single frontal view of the chest demonstrates an unremarkable cardiac silhouette. There is patchy bibasilar airspace disease, right greater than left. No effusion or pneumothorax. No acute bony abnormalities. IMPRESSION: 1. Patchy bibasilar airspace disease greatest at the right lung base, which could reflect pneumonia given clinical presentation. Electronically Signed   By: Sharlet Salina M.D.    On: 05/28/2023 14:55    EKG: Independently reviewed.  NSR with rate 63; nonspecific ST changes with no evidence of acute ischemia. LAFB and RBBB similar to past    Labs on Admission: I have personally reviewed the available labs and imaging studies at the time of the admission.  Pertinent labs:   wbc: 23.5,  hgb: 10.3 troponin 354>    potassium: 6.7,  BUN: 48,  creatinine: 7.32,  lactic acid: 1.9>2.1   Assessment and Plan: Principal Problem:   Severe sepsis (HCC) Active Problems:   ESRD on hemodialysis with hyperkalemia   Acute respiratory failure with hypoxia (HCC)   Acute encephalopathy   CAD (dz of distal, mid and proximal RCA with implantation of 3 overlapping drug-eluting stent, with elevated troponin   Insulin dependent type 2 diabetes mellitus (HCC)   Paroxysmal atrial fibrillation (HCC)   Benign hypertension   Peripheral artery disease (HCC)   Hyperlipidemia   Chronic diastolic heart failure, NYHA class 2 (HCC)   Barrett's esophagus without dysplasia   Anemia of renal disease   Chronic back pain  Elevated troponin    Assessment and Plan: * Severe sepsis (HCC) 67 year old presenting with confusion, fever and concerns for infection found to be septic with fever to 101.3, leukocytosis, lactic acidosis and some hypotension  -admit to progressive -given 500cc IVF bolus in ED  -pan cultured -CXR with possible pneumonia and will work this up, but he denies coughing or shortness of breath even though hypoxic to 88% on RA -check urine antigens, continue broad spectrum antibiotics -CT abdomen/pelvis pending: denies abdominal pain, N/V/D  -he has some altered mental status, head CT pending but no neurological deficits on exam and this is improving.  -open wound on right amputation stump, but appears superficial with no drainage/erythema   ESRD on hemodialysis with hyperkalemia On HD TTSaturday. Last dialysis on Thursday 05/23/23 Hyperkalemia with no EKG changes Given  calcium gluconate, sodium bicarb, novolog in ED Nephrology consulted and appreciate  HD per nephrology   Acute respiratory failure with hypoxia (HCC) -Oxygen of 88% on RA on arrival and requiring 2L to maintain oxygenation  -CXR with possible pneumonia vs. Missed dialysis x 2 sessions  -Stable on 2L on [place] -venous pH wnl  -antibiotics/pneumonia work up -troponin mildly elevated, but no chest pain or acute EKG changes  -nephrology to see for HD -fentanyl patch removed (on oral narcotics and fentanyl patch. Will hold this for now, continue oral pain medication PRN )  -continue to wean oxygen as tolerated   Acute encephalopathy Transient confusion in setting of sepsis, likely infectious as source vs. Uremia due to missed dialysis  Will check metabolic labs Continue broad spectrum antibiotics  Head CT with no acute finding but previous remote CVA with no known hx of CVA. Echo 7/24 with no shunt. On eliquis/lipitor. Check brain MRI.  Dialysis per nephrology  Anti-pyretics   CAD (dz of distal, mid and proximal RCA with implantation of 3 overlapping drug-eluting stent, with elevated troponin Troponin 354>delta pending No chest pain and no acute changes on EKG EDP discussed with cardiology, continue to monitor no intervention Likely demand ischemia in setting of acute respiratory failure  On telemetry, continue to follow delta troponin Followed by Dr. Nadara Eaton and had discussion with family about LHC in August 2024, but opted for conservative management  Continue medical management with lipitor, labatelol, imdur and eliquis   Insulin dependent type 2 diabetes mellitus (HCC) Well controlled A1C of 6.1 in July 2024  Poor PO intake. Hold long acting for tonight  SSI and accuchecks qac/hs   Paroxysmal atrial fibrillation (HCC) NSR, rate controlled Continue eliquis, amiodarone  Hold labetalol with hypotension   Benign hypertension Hypotensive and wife states he has been running 80s  systolic Sepsis could be contributing factor Hold anti-HTN: norvasc, imdur, labetaolol, hydralazine.  Continue PRN midodrine   Peripheral artery disease (HCC) -history of bilateral below-knee amputation -multiple peripheral interventions in the past -continue eliquis/lipitor   Chronic diastolic heart failure, NYHA class 2 (HCC) Echo in 12/2022 with normal EF and grade 1 DD  Volume control per dialysis   Hyperlipidemia Continue lipitor   Barrett's esophagus without dysplasia Continue pepcid, no PPI   Anemia of renal disease Hgb 10.3, stable   Chronic back pain Hold fentanyl patch Continue low dose gabapentin and oral pain medication PRN      Advance Care Planning:   Code Status: Full Code   Consults: nephrology:   DVT Prophylaxis: eliquis   Family Communication: updated wife by phone.   Severity of Illness: The appropriate patient status for  this patient is INPATIENT. Inpatient status is judged to be reasonable and necessary in order to provide the required intensity of service to ensure the patient's safety. The patient's presenting symptoms, physical exam findings, and initial radiographic and laboratory data in the context of their chronic comorbidities is felt to place them at high risk for further clinical deterioration. Furthermore, it is not anticipated that the patient will be medically stable for discharge from the hospital within 2 midnights of admission.   * I certify that at the point of admission it is my clinical judgment that the patient will require inpatient hospital care spanning beyond 2 midnights from the point of admission due to high intensity of service, high risk for further deterioration and high frequency of surveillance required.*  Author: Orland Mustard, MD 05/28/2023 6:21 PM  For on call review [email or web address].

## 2023-05-28 NOTE — ED Notes (Signed)
Patient transported to dialysis at this time.

## 2023-05-28 NOTE — Assessment & Plan Note (Addendum)
67 year old presenting with confusion, fever and concerns for infection found to be septic with fever to 101.3, leukocytosis, lactic acidosis and some hypotension  -admit to progressive -given 500cc IVF bolus in ED  -pan cultured -CXR with possible pneumonia and will work this up, but he denies coughing or shortness of breath even though hypoxic to 88% on RA -check urine antigens, continue broad spectrum antibiotics -CT abdomen/pelvis pending: denies abdominal pain, N/V/D  -he has some altered mental status, head CT pending but no neurological deficits on exam and this is improving.  -open wound on right amputation stump, but appears superficial with no drainage/erythema

## 2023-05-28 NOTE — Progress Notes (Signed)
ED Pharmacy Antibiotic Sign Off An antibiotic consult was received from an ED provider for vancomycin and cefepime per pharmacy dosing for vancomycin. A chart review was completed to assess appropriateness.   The following one time order(s) were placed:  Vancomycin 2000mg  IV x1 Cefepime 2000mg  IV x1  Further antibiotic and/or antibiotic pharmacy consults should be ordered by the admitting provider if indicated.   Thank you for allowing pharmacy to be a part of this patient's care.   Smitty Cords, [facility]  Clinical Pharmacist 05/28/23 1:24 PM

## 2023-05-28 NOTE — Assessment & Plan Note (Signed)
Echo in 12/2022 with normal EF and grade 1 DD  Volume control per dialysis

## 2023-05-28 NOTE — Assessment & Plan Note (Addendum)
Troponin 354>delta pending No chest pain and no acute changes on EKG EDP discussed with cardiology, continue to monitor no intervention Likely demand ischemia in setting of acute respiratory failure  On telemetry, continue to follow delta troponin Followed by Dr. Nadara Eaton and had discussion with family about LHC in August 2024, but opted for conservative management  Continue medical management with lipitor, labatelol, imdur and eliquis

## 2023-05-28 NOTE — ED Notes (Signed)
CRITICAL VALUE STICKER  CRITICAL VALUE:K+ 6.7, troponin 354  RECEIVER (on-site recipient of call):I. Maize Brittingham  DATE & TIME NOTIFIED: 05/28/23 1406  MESSENGER (representative from lab):lab  MD NOTIFIED: [place]  TIME OF NOTIFICATION:1407  RESPONSE:

## 2023-05-28 NOTE — ED Provider Notes (Signed)
[place] EMERGENCY DEPARTMENT AT [facility] Provider Note   CSN: [id number] Arrival date & time: 05/28/23  1229     History  Chief Complaint  Patient presents with   Altered Mental Status   Fever    [name] is a 67 y.o. male.  67 year old male with prior medical history as detailed below presents from [facility] facility for evaluation.  Patient with decreased responsiveness x 1 day.  Patient is febrile x 1 day.  EMS reports fever.    Patient is somnolent but arousable with verbal stimulation.  He answers questions appropriately.  He denies specific complaint.  Initial oral temperature is 101.3.  Patient is on dialysis.  His last dialysis session was reportedly completed on Saturday.  He was due for dialysis this morning.  Fentanyl patch present on left anterior chest wall on arrival.  This was removed on arrival.  The history is provided by the patient, medical records and the EMS personnel.       Home Medications Prior to Admission medications   Medication Sig Start Date End Date Taking? Authorizing Provider  amiodarone (PACERONE) 200 MG tablet Take 1 tablet (200 mg total) by mouth daily. Patient taking differently: Take 100 mg by mouth daily. 10/29/22   Yates Decamp, MD  amLODipine (NORVASC) 5 MG tablet Take 1 tablet (5 mg total) by mouth daily. 04/24/23   Yates Decamp, MD  apixaban (ELIQUIS) 5 MG TABS tablet Take 1 tablet (5 mg total) by mouth 2 (two) times daily. 01/14/22 04/24/23  Almon Hercules, MD  atorvastatin (LIPITOR) 40 MG tablet Take 40 mg by mouth at bedtime.     [provider]  B Complex-C-Zn-Folic Acid (DIALYVITE/ZINC) TABS Take 1 tablet by mouth Every Tuesday,Thursday,and Saturday with dialysis. 10/04/21   [provider]  famotidine (PEPCID) 20 MG tablet Take 20 mg by mouth every other day.    [provider]  fentaNYL (DURAGESIC) 50 MCG/HR Place 1 patch onto the skin every 3 (three) days. 04/01/23   [name], MD   folic acid (FOLVITE) 1 MG tablet Take 1 mg by mouth daily. 09/18/21   [provider]  hydrALAZINE (APRESOLINE) 50 MG tablet Take 1 tablet (50 mg total) by mouth every 8 (eight) hours. 04/01/23   [name], MD  insulin aspart (NOVOLOG FLEXPEN) 100 UNIT/ML FlexPen Inject 0-12 Units into the skin See admin instructions. Inject as per sliding scale: If 0 - 69 = 0 initiate hypoglycemia orders;  70 - 150 = 0;  151 - 200 = 2;  201 - 250 = 4;  251 - 300 = 6;  301 - 350 = 8;  351 - 400 = 10;  401 - 450 = 12.  If >400, give 12 units, recheck in 1 hour. If still >400, call MD/NP for further orders. Subcutaneously with meals in addition to scheduled 4 unites with meals.    [provider]  insulin glargine (LANTUS) 100 UNIT/ML injection Inject 0.2 mLs (20 Units total) into the skin at bedtime. 06/28/22   Zigmund Daniel., MD  isosorbide mononitrate (IMDUR) 60 MG 24 hr tablet Take 1 tablet (60 mg total) by mouth daily. 01/21/23 04/24/23  Yates Decamp, MD  labetalol (NORMODYNE) 200 MG tablet Take 1 tablet (200 mg total) by mouth 2 (two) times daily. 04/01/23   [name], MD  lidocaine-prilocaine (EMLA) cream Apply 1 application topically See admin instructions. Prior to Dialysis days Tuesday,Thursday and saturday 06/06/21  [provider]  loratadine (CLARITIN) 10 MG tablet Take 10 mg by mouth daily.    [provider]  metoCLOPramide (REGLAN) 5 MG tablet Take 1 tablet (5 mg total) by mouth 4 (four) times daily -  before meals and at bedtime. Patient taking differently: Take 5 mg by mouth in the morning, at noon, in the evening, and at bedtime. 01/14/22 04/24/23  Almon Hercules, MD  NEEDLE, REUSABLE, 22 G 22G X 1-1/2" MISC 1 Units by Does not apply route as directed. For B12 IM inj 02/15/18   Elgergawy, Leana Roe, MD  nitroGLYCERIN (NITROSTAT) 0.4 MG SL tablet Place 0.4 mg under the tongue every 5 (five) minutes as needed for chest pain. 01/06/22   [provider]  omega-3 acid ethyl esters (LOVAZA) 1 g capsule Take 2 g by mouth 2 (two) times daily.    [provider]  ondansetron (ZOFRAN) 4 MG tablet Take 1 tablet (4 mg total) by mouth every 8 (eight) hours as needed for nausea or vomiting. 04/24/23   Yates Decamp, MD  pantoprazole (PROTONIX) 40 MG tablet Take 1 tablet (40 mg total) by mouth 2 (two) times daily for 30 days, THEN 1 tablet (40 mg total) daily. Patient taking differently:  1 tablet (40 mg total) twice daily. 01/14/22 04/24/23  Almon Hercules, MD  Polyvinyl Alcohol-Povidone PF 1.4-0.6 % SOLN Place 1 drop into both eyes 2 (two) times daily as needed (dry eyes).     [provider]  pregabalin (LYRICA) 75 MG capsule Take 1 capsule (75 mg total) by mouth daily for 5 days. 04/01/23 04/24/23  [name], MD  rOPINIRole (REQUIP) 2 MG tablet Take 2 mg by mouth at bedtime.    [provider]  senna-docusate (SENOKOT-S) 8.6-50 MG tablet Take 2 tablets by mouth at bedtime.    [provider]  Syringe/Needle, Disp, (SYRINGE 3CC/22GX1-1/2") 22G X 1-1/2" 3 ML MISC 1 Syringe by Does not apply route as directed. For b12 IM inj 02/15/18   Elgergawy, Leana Roe, MD  tamsulosin (FLOMAX) 0.4 MG CAPS capsule Take 0.4 mg by mouth daily.    [provider]      Allergies    Contrast media [iodinated contrast media], Ivp dye [iodinated contrast media], Adhesive [tape], and Latex    Review of Systems   Review of Systems  Unable to perform ROS: Acuity of condition    Physical Exam Updated Vital Signs BP (!) 148/63   Pulse 64   Temp (!) 101.3 F (38.5 C)   Resp 15   SpO2 95%  Physical Exam Vitals and nursing note reviewed.  Constitutional:      General: He is not in acute distress.    Appearance: He is well-developed.     Comments: Somnolent, arousable with verbal stimulation, answers questions slowly  HENT:     Head: Normocephalic and atraumatic.  Eyes:     Conjunctiva/sclera: Conjunctivae normal.     Pupils:  Pupils are equal, round, and reactive to light.  Cardiovascular:     Rate and Rhythm: Normal rate and regular rhythm.     Heart sounds: Normal heart sounds.  Pulmonary:     Effort: Pulmonary effort is normal. No respiratory distress.     Comments: Decreased breath sounds at bases. Abdominal:     General: There is no distension.     Palpations: Abdomen is soft.     Tenderness: There is no abdominal tenderness.  Musculoskeletal:  General: No deformity. Normal range of motion.     Cervical back: Normal range of motion and neck supple.     Comments: Fistula in left upper extremity without overlying erythema.  Palpable thrill present.  Status post bilateral lower extremity amputation.  Skin:    General: Skin is warm and dry.  Neurological:     General: No focal deficit present.     Mental Status: He is oriented to person, place, and time.     ED Results / Procedures / Treatments   Labs (all labs ordered are listed, but only abnormal results are displayed) Labs Reviewed  CULTURE, BLOOD (ROUTINE X 2)  CULTURE, BLOOD (ROUTINE X 2)  RESP PANEL BY RT-PCR (RSV, FLU A&B, COVID)  [id number]  URINE CULTURE  CBC WITH DIFFERENTIAL/PLATELET  LIPASE, BLOOD  COMPREHENSIVE METABOLIC PANEL  ETHANOL  URINALYSIS, W/ REFLEX TO CULTURE (INFECTION SUSPECTED)  I-STAT CHEM 8, ED  I-STAT CG4 LACTIC ACID, ED  CBG MONITORING, ED  I-STAT VENOUS BLOOD GAS, ED  TROPONIN I (HIGH SENSITIVITY)    EKG None  Radiology No results found.  Procedures Procedures    Medications Ordered in ED Medications  acetaminophen (TYLENOL) suppository 650 mg (has no administration in time range)    ED Course/ Medical Decision Making/ A&P                                 Medical Decision Making Amount and/or Complexity of Data Reviewed Labs: ordered. Radiology: ordered.  Risk OTC drugs. Prescription drug management. Decision regarding hospitalization.    Medical Screen Complete  This patient  presented to the ED with complaint of fever, ams, ESRD.  This complaint involves an extensive number of treatment options. The initial differential diagnosis includes, but is not limited to, viral versus bacterial infection, metabolic abnormality, intracranial pathology such as hemorrhage, etc.  This presentation is: Acute, Chronic, Self-Limited, Previously Undiagnosed, Uncertain Prognosis, Complicated, Systemic Symptoms, and Threat to Life/Bodily Function  Patient with medical history significant for ESRD on HD, CAD, chronic diastolic heart failure, insulin-dependent diabetes, PAD, A-fib, GERD, hypertension presents with confusion, alteration in mental status.  Patient with fever on arrival.  Screening labs and imaging ordered.  X-ray is suggestive of pneumonia.  Patient with mild high hypoxia and significant cough on exam.  Broad-spectrum antibiotics ordered.  Patient with well-preserved hemodynamics.  Aggressive fluid resuscitation limited given patient's dialysis status.  It is unclear based on his exam and lab findings as to when his last dialysis session was.  Patient would benefit from admission.  Hospitalist service made aware of case and will evaluate for admission.  Nephrology is aware of case and will follow in consultation.  Patient will require dialysis as an inpatient.  Dr. Jacinto Halim with cardiology is aware of patient's case and elevated troponin.  He recommends continued trending of troponin.   Additional history obtained:  Additional history obtained from EMS External records from outside sources obtained and reviewed including prior ED visits and prior Inpatient records.    Lab Tests:  I ordered and personally interpreted labs.  The pertinent results include: CBC, CMP, lactic acid, UA   Imaging Studies ordered:  I ordered imaging studies including chest x-ray, CT head, CT chest abdomen pelvis  I agree with the radiologist interpretation.   Cardiac  Monitoring:  The patient was maintained on a cardiac monitor.  I personally viewed and interpreted the cardiac monitor which showed an underlying  rhythm of: NSR   Medicines ordered:  I ordered medication including spectrum antibiotics for suspected infection Reevaluation of the patient after these medicines showed that the patient: improved    Problem List / ED Course:  Fever   Reevaluation:  After the interventions noted above, I reevaluated the patient and found that they have: improved   Disposition:  After consideration of the diagnostic results and the patients response to treatment, I feel that the patent would benefit from admission.   CRITICAL CARE Performed by: Wynetta Fines   Total critical care time: 30 minutes  Critical care time was exclusive of separately billable procedures and treating other patients.  Critical care was necessary to treat or prevent imminent or life-threatening deterioration.  Critical care was time spent personally by me on the following activities: development of treatment plan with patient and/or surrogate as well as nursing, discussions with consultants, evaluation of patient's response to treatment, examination of patient, obtaining history from patient or surrogate, ordering and performing treatments and interventions, ordering and review of laboratory studies, ordering and review of radiographic studies, pulse oximetry and re-evaluation of patient's condition.          Final Clinical Impression(s) / ED Diagnoses Final diagnoses:  Sepsis, due to unspecified organism, unspecified whether acute organ dysfunction present [facility])    Rx / DC Orders ED Discharge Orders     None         Wynetta Fines, MD 05/28/23 1726

## 2023-05-28 NOTE — Assessment & Plan Note (Signed)
Hypotensive and wife states he has been running 80s systolic Sepsis could be contributing factor Hold anti-HTN: norvasc, imdur, labetaolol, hydralazine.  Continue PRN midodrine

## 2023-05-28 NOTE — Assessment & Plan Note (Signed)
Hold fentanyl patch Continue low dose gabapentin and oral pain medication PRN

## 2023-05-28 NOTE — Assessment & Plan Note (Addendum)
NSR, rate controlled Continue eliquis, amiodarone  Hold labetalol with hypotension

## 2023-05-28 NOTE — Assessment & Plan Note (Signed)
Continue pepcid, no PPI

## 2023-05-29 ENCOUNTER — Inpatient Hospital Stay (HOSPITAL_COMMUNITY): Payer: Medicare HMO

## 2023-05-29 ENCOUNTER — Encounter (HOSPITAL_COMMUNITY): Payer: Self-pay | Admitting: Family Medicine

## 2023-05-29 DIAGNOSIS — R6520 Severe sepsis without septic shock: Secondary | ICD-10-CM | POA: Diagnosis not present

## 2023-05-29 DIAGNOSIS — A419 Sepsis, unspecified organism: Secondary | ICD-10-CM | POA: Diagnosis not present

## 2023-05-29 LAB — GLUCOSE, CAPILLARY
Glucose-Capillary: 126 mg/dL — ABNORMAL HIGH (ref 70–99)
Glucose-Capillary: 126 mg/dL — ABNORMAL HIGH (ref 70–99)
Glucose-Capillary: 123 mg/dL — ABNORMAL HIGH (ref 70–99)
Glucose-Capillary: 206 mg/dL — ABNORMAL HIGH (ref 70–99)

## 2023-05-29 LAB — CBG MONITORING, ED: Glucose-Capillary: 130 mg/dL — ABNORMAL HIGH (ref 70–99)

## 2023-05-29 LAB — TROPONIN I (HIGH SENSITIVITY): Troponin I (High Sensitivity): 574 ng/L (ref <18)

## 2023-05-29 MED ORDER — OXYCODONE-ACETAMINOPHEN 5-325 MG PO TABS
1.0000 | ORAL_TABLET | Freq: Three times a day (TID) | ORAL | Status: DC | PRN
  Administered 2023-05-29 – 2023-05-31 (×4): 1 via ORAL
  Filled 2023-05-29 (×4): qty 1

## 2023-05-29 MED ORDER — SODIUM CHLORIDE 0.9 % IV SOLN
2.0000 g | Freq: Every day | INTRAVENOUS | Status: DC
  Administered 2023-05-29 – 2023-05-31 (×3): 2 g via INTRAVENOUS
  Filled 2023-05-29 (×3): qty 20

## 2023-05-29 MED ORDER — DOXYCYCLINE HYCLATE 100 MG PO TABS
100.0000 mg | ORAL_TABLET | Freq: Two times a day (BID) | ORAL | Status: DC
  Administered 2023-05-29 – 2023-05-31 (×5): 100 mg via ORAL
  Filled 2023-05-29 (×5): qty 1

## 2023-05-29 MED ORDER — ATORVASTATIN CALCIUM 40 MG PO TABS
40.0000 mg | ORAL_TABLET | Freq: Every day | ORAL | Status: DC
  Administered 2023-05-29 – 2023-05-30 (×2): 40 mg via ORAL
  Filled 2023-05-29 (×2): qty 1

## 2023-05-29 MED ORDER — LORATADINE 10 MG PO TABS
10.0000 mg | ORAL_TABLET | Freq: Every day | ORAL | Status: DC
  Administered 2023-05-29 – 2023-05-31 (×3): 10 mg via ORAL
  Filled 2023-05-29 (×3): qty 1

## 2023-05-29 MED ORDER — CHLORHEXIDINE GLUCONATE CLOTH 2 % EX PADS
6.0000 | MEDICATED_PAD | Freq: Every day | CUTANEOUS | Status: DC
  Administered 2023-05-29 – 2023-05-31 (×2): 6 via TOPICAL

## 2023-05-29 MED ORDER — GABAPENTIN 100 MG PO CAPS
100.0000 mg | ORAL_CAPSULE | Freq: Three times a day (TID) | ORAL | Status: DC
  Administered 2023-05-29 – 2023-05-31 (×5): 100 mg via ORAL
  Filled 2023-05-29 (×5): qty 1

## 2023-05-29 MED ORDER — APIXABAN 5 MG PO TABS
5.0000 mg | ORAL_TABLET | Freq: Two times a day (BID) | ORAL | Status: DC
  Administered 2023-05-29 – 2023-05-31 (×5): 5 mg via ORAL
  Filled 2023-05-29 (×5): qty 1

## 2023-05-29 MED ORDER — AMIODARONE HCL 200 MG PO TABS
200.0000 mg | ORAL_TABLET | Freq: Every day | ORAL | Status: DC
  Administered 2023-05-29 – 2023-05-31 (×3): 200 mg via ORAL
  Filled 2023-05-29 (×3): qty 1

## 2023-05-29 MED ORDER — FAMOTIDINE 20 MG PO TABS
20.0000 mg | ORAL_TABLET | ORAL | Status: DC
  Administered 2023-05-29 – 2023-05-31 (×2): 20 mg via ORAL
  Filled 2023-05-29 (×2): qty 1

## 2023-05-29 MED ORDER — ROPINIROLE HCL 1 MG PO TABS
2.0000 mg | ORAL_TABLET | Freq: Every day | ORAL | Status: DC
  Administered 2023-05-29 – 2023-05-30 (×2): 2 mg via ORAL
  Filled 2023-05-29 (×2): qty 2

## 2023-05-29 MED ORDER — LIDOCAINE-PRILOCAINE 2.5-2.5 % EX CREA: 1.0000 | TOPICAL_CREAM | CUTANEOUS | Status: DC

## 2023-05-29 MED ORDER — RENA-VITE PO TABS
1.0000 | ORAL_TABLET | Freq: Every day | ORAL | Status: DC
  Administered 2023-05-29 – 2023-05-30 (×2): 1 via ORAL
  Filled 2023-05-29 (×2): qty 1

## 2023-05-29 MED ORDER — FENTANYL 25 MCG/HR TD PT72
1.0000 | MEDICATED_PATCH | TRANSDERMAL | Status: DC
  Administered 2023-05-29: 1 via TRANSDERMAL
  Filled 2023-05-29: qty 1

## 2023-05-29 MED ORDER — MIDODRINE HCL 5 MG PO TABS: 10.0000 mg | ORAL_TABLET | Freq: Every day | ORAL | Status: DC | PRN

## 2023-05-29 MED ORDER — METOCLOPRAMIDE HCL 10 MG PO TABS
5.0000 mg | ORAL_TABLET | Freq: Three times a day (TID) | ORAL | Status: DC
  Administered 2023-05-29 – 2023-05-31 (×7): 5 mg via ORAL
  Filled 2023-05-29 (×7): qty 1

## 2023-05-29 MED ORDER — TAMSULOSIN HCL 0.4 MG PO CAPS
0.4000 mg | ORAL_CAPSULE | Freq: Every day | ORAL | Status: DC
  Administered 2023-05-29 – 2023-05-31 (×3): 0.4 mg via ORAL
  Filled 2023-05-29 (×3): qty 1

## 2023-05-29 MED ORDER — DOCUSATE SODIUM 100 MG PO CAPS
100.0000 mg | ORAL_CAPSULE | Freq: Two times a day (BID) | ORAL | Status: DC
  Administered 2023-05-29 – 2023-05-30 (×3): 100 mg via ORAL
  Filled 2023-05-29 (×4): qty 1

## 2023-05-29 MED ORDER — HEPARIN SODIUM (PORCINE) 5000 UNIT/ML IJ SOLN: 5000.0000 [IU] | Freq: Three times a day (TID) | INTRAMUSCULAR | Status: DC

## 2023-05-29 MED ORDER — ICOSAPENT ETHYL 1 G PO CAPS
1.0000 g | ORAL_CAPSULE | ORAL | Status: DC
  Administered 2023-05-29 – 2023-05-31 (×2): 1 g via ORAL
  Filled 2023-05-29 (×2): qty 1

## 2023-05-29 MED ORDER — FOLIC ACID 1 MG PO TABS
1.0000 mg | ORAL_TABLET | Freq: Every day | ORAL | Status: DC
  Administered 2023-05-29 – 2023-05-31 (×3): 1 mg via ORAL
  Filled 2023-05-29 (×3): qty 1

## 2023-05-29 MED ORDER — SENNOSIDES-DOCUSATE SODIUM 8.6-50 MG PO TABS
2.0000 | ORAL_TABLET | Freq: Every day | ORAL | Status: DC
  Administered 2023-05-29 – 2023-05-30 (×2): 2 via ORAL
  Filled 2023-05-29 (×2): qty 2

## 2023-05-29 MED ORDER — DOXERCALCIFEROL 4 MCG/2ML IV SOLN
1.0000 ug | INTRAVENOUS | Status: DC
  Administered 2023-05-30: 1 ug via INTRAVENOUS
  Filled 2023-05-29 (×2): qty 2

## 2023-05-29 NOTE — Progress Notes (Signed)
   05/29/23 0135  Vitals  BP (!) 157/54  MAP (mmHg) 84  BP Location Right Arm  BP Method Automatic  Patient Position (if appropriate) Lying  Pulse Rate 73  Pulse Rate Source Monitor  ECG Heart Rate 74  Resp 16  Oxygen Therapy  O2 Device Nasal Cannula  O2 Flow Rate (L/min) 2 L/min  Pulse Oximetry Type Continuous  During Treatment Monitoring  Blood Flow Rate (mL/min) 399 mL/min  Arterial Pressure (mmHg) -171.3 mmHg  Venous Pressure (mmHg) 214.73 mmHg  TMP (mmHg) 11.11 mmHg  Ultrafiltration Rate (mL/min) 1052 mL/min  Dialysate Flow Rate (mL/min) 299 ml/min  Dialysate Potassium Concentration 2  Dialysate Calcium Concentration 2.5  Duration of HD Treatment -hour(s) 3.47 hour(s)  Cumulative Fluid Removed (mL) per Treatment  2972.49  Intra-Hemodialysis Comments Tx completed  Post Treatment  Dialyzer Clearance Lightly streaked  Liters Processed 84  Fluid Removed (mL) 3000 mL  Tolerated HD Treatment Yes  Post-Hemodialysis Comments treatment uneventful  AVG/AVF Arterial Site Held (minutes) 10 minutes  AVG/AVF Venous Site Held (minutes) 10 minutes   Pt tolerated treatment well. Pt return to E/R. Hand off to M.Fairly RN

## 2023-05-29 NOTE — ED Notes (Signed)
Pt returned back to room from dialysis at this time.

## 2023-05-29 NOTE — ED Notes (Signed)
ED TO INPATIENT HANDOFF REPORT  ED Nurse Name and Phone #: Morrie Sheldon [phone]  S Name/Age/Gender [name] 67 y.o. male Room/Bed: 046C/046C  Code Status   Code Status: Full Code  Home/SNF/Other Skilled nursing facility Patient oriented to: self and place Is this baseline? Yes   Triage Complete: Triage complete  Chief Complaint Severe sepsis (HCC) [A41.9, R65.20]  Triage Note Pt arrives via EMS from [facility]. Staff called ems due to patient's AMS. Upon arrival patient is febrile. He will open his eyes when you call out his name. Pt did not get dialysis this morning.    Allergies Allergies  Allergen Reactions   Contrast Media [Iodinated Contrast Media] Shortness Of Breath and Other (See Comments)    Difficulty breathing and altered mental status     Ivp Dye [Iodinated Contrast Media] Anaphylaxis, Shortness Of Breath and Other (See Comments)    Breathing problems, altered mental state    Adhesive [Tape] Rash and Other (See Comments)    Rash after 1 day of use Do not leave on longer then 3 days with out be changed   Latex Rash and Other (See Comments)    A severe rash appears after the first 24 hours of being placed    Level of Care/Admitting Diagnosis ED Disposition     ED Disposition  Admit   Condition  --   Comment  Hospital Area: [facility] [[id number]]  Level of Care: Progressive [102]  Admit to Progressive based on following criteria: MULTISYSTEM THREATS such as stable sepsis, metabolic/electrolyte imbalance with or without encephalopathy that is responding to early treatment.  May admit patient to [facility] or [facility] if equivalent level of care is available:: No  Covid Evaluation: Confirmed COVID Negative  Diagnosis: Severe sepsis [facility]) [[id number]]  Admitting Physician: Orland Mustard [[id number]]  Attending Physician: Orland Mustard [phone]  Certification:: I certify this patient will need inpatient services for at least 2 midnights   Expected Medical Readiness: 05/31/2023          B Medical/Surgery History Past Medical History:  Diagnosis Date   Carotid artery occlusion 11/10/2010   LEFT CAROTID ENDARTERECTOMY   Complication of anesthesia    BP WENT UP AT [facility] "   COPD (chronic obstructive pulmonary disease) (HCC)    pt denies this dx as of 06/01/20 - no inhaler    Diabetes mellitus without complication (HCC)    Diverticulitis    Diverticulosis of colon (without mention of hemorrhage)    DJD (degenerative joint disease)    knees/hands/feet/back/neck   ESRD (end stage renal disease) on dialysis (HCC)    Fatty liver    Full dentures    GERD (gastroesophageal reflux disease)    H/O hiatal hernia    History of blood transfusion    with a past surical procedure per patient 06/01/20   Hyperlipidemia    Hypertension    Neuromuscular disorder (HCC)    peripheral neuropathy   Non-pressure chronic ulcer of other part of left foot limited to breakdown of skin (HCC) 11/12/2016   Osteomyelitis (HCC)    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 Atherectomy left popliteal artery July 2012.    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   Unstable angina (HCC) 09/16/2018  Wears glasses    Past Surgical History:  Procedure Laterality Date   ABDOMINAL AORTOGRAM W/LOWER EXTREMITY N/A 06/23/2021   Procedure: ABDOMINAL AORTOGRAM W/LOWER EXTREMITY;  Surgeon: Elder Negus, MD;  [facility];  Service: Cardiovascular;  Laterality: N/A;   AMPUTATION  11/05/2011   Procedure: AMPUTATION RAY;  Surgeon: Toni Arthurs, MD;  Location: [facility] OR;  Service: Orthopedics;  Laterality: Right;  Amputation of Right 4&5th Toes   AMPUTATION Left 11/26/2012   Procedure: AMPUTATION RAY;  Surgeon: Toni Arthurs, MD;  Location: [facility] OR;  Service: Orthopedics;   Laterality: Left;  fourth ray amputation   AMPUTATION Right 08/27/2014   Procedure: Transmetatarsal Amputation;  Surgeon: Nadara Mustard, MD;  Location[facility] OR;  Service: Orthopedics;  Laterality: Right;   AMPUTATION Right 01/14/2015   Procedure: AMPUTATION BELOW KNEE;  Surgeon: Nadara Mustard, MD;  [facility] OR;  Service: Orthopedics;  Laterality: Right;   AMPUTATION Left 10/21/2015   Procedure: Left Foot 5th Ray Amputation;  Surgeon: Nadara Mustard, MD;  Location: [facility] OR;  Service: Orthopedics;  Laterality: Left;   AMPUTATION Left 07/04/2022   Procedure: LEFT BELOW KNEE AMPUTATION;  Surgeon: Nadara Mustard, MD;  Location: [facility] OR;  Service: Orthopedics;  Laterality: Left;   ANTERIOR FUSION CERVICAL SPINE  02/06/06   C4-5, C5-6, C6-7; SURGEON DR. MAX COHEN   AV FISTULA PLACEMENT Left 06/02/2020   Procedure: ARTERIOVENOUS (AV) FISTULA CREATION LEFT;  Surgeon: Maeola Harman, MD;  Location: [facility] OR;  Service: Vascular;  Laterality: Left;   BACK SURGERY     x 3   BASCILIC VEIN TRANSPOSITION Left 07/21/2020   Procedure: LEFT UPPER ARM ATERIOVENOUS SUPERFISTULALIZATION;  Surgeon: Maeola Harman, MD;  Location: [facility] OR;  Service: Vascular;  Laterality: Left;   BELOW KNEE LEG AMPUTATION Right    CARDIAC CATHETERIZATION  10/31/04   2009   CAROTID ENDARTERECTOMY  11/10/10   CAROTID ENDARTERECTOMY Left 11/10/2010   Subtotal occlusion of left internal carotid artery with left hemispheric transient ischemic attacks.   CAROTID STENT     CARPAL TUNNEL RELEASE Right 10/21/2013   Procedure: RIGHT CARPAL TUNNEL RELEASE;  Surgeon: Nicki Reaper, MD;  Location: [facility];  Service: Orthopedics;  Laterality: Right;   CHOLECYSTECTOMY     COLON SURGERY     COLONOSCOPY     COLOSTOMY REVERSAL  05/21/2018   ileostomy reversal   CYSTOSCOPY WITH STENT PLACEMENT Bilateral 01/13/2018   Procedure: CYSTOSCOPY WITH BILATERAL URETERAL CATHETER PLACEMENT;  Surgeon: Crist Fat, MD;  Location:  [facility] ORS;  Service: Urology;  Laterality: Bilateral;   ESOPHAGEAL MANOMETRY Bilateral 07/19/2014   Procedure: ESOPHAGEAL MANOMETRY (EM);  Surgeon: Beverley Fiedler, MD;  Location: [facility];  Service: Gastroenterology;  Laterality: Bilateral;   EYE SURGERY Bilateral 2020   cataract   FEMORAL ARTERY STENT     x6   FINGER SURGERY     FOOT SURGERY  04/25/2016    EXCISION BASE 5TH METATARSAL AND PARTIAL CUBOID LEFT FOOT   HERNIA REPAIR     LEFT INGUINAL AND UMBILICAL REPAIRS   HERNIA REPAIR     I & D EXTREMITY Left 04/25/2016   Procedure: EXCISION BASE 5TH METATARSAL AND PARTIAL CUBOID LEFT FOOT;  Surgeon: Nadara Mustard, MD;  [facility];  Service: Orthopedics;  Laterality: Left;   ILEOSTOMY  01/13/2018   Procedure: ILEOSTOMY;  Surgeon: Berna Bue, MD;  Location: [facility];  Service: General;;   ILEOSTOMY CLOSURE N/A  05/21/2018   Procedure: ILEOSTOMY REVERSAL ERAS PATHWAY;  Surgeon: Berna Bue, MD;  Location: [facility] OR;  Service: General;  Laterality: N/A;   IR RADIOLOGIST EVAL & MGMT  11/19/2017   IR RADIOLOGIST EVAL & MGMT  12/03/2017   IR RADIOLOGIST EVAL & MGMT  12/18/2017   JOINT REPLACEMENT Right 2001   Total knee   LAMINECTOMY     X 3 LUMBAR AND X 2 CERVICAL SPINE OPERATIONS   LAPAROSCOPIC CHOLECYSTECTOMY W/ CHOLANGIOGRAPHY  11/09/04   SURGEON DR. Caleen Essex   LEFT HEART CATH AND CORONARY ANGIOGRAPHY N/A 09/16/2018   Procedure: LEFT HEART CATH AND CORONARY ANGIOGRAPHY;  Surgeon: Elder Negus, MD;  [facility];  Service: Cardiovascular;  Laterality: N/A;   LEFT HEART CATHETERIZATION WITH CORONARY ANGIOGRAM N/A 10/29/2014   Procedure: LEFT HEART CATHETERIZATION WITH CORONARY ANGIOGRAM;  Surgeon: Pamella Pert, MD;  Location: [facility] CATH LAB;  Service: Cardiovascular;  Laterality: N/A;   LIGATION OF COMPETING BRANCHES OF ARTERIOVENOUS FISTULA Left 07/21/2020   Procedure: LIGATION OF COMPETING BRANCHES OF LEFT UPPER ARM ARTERIOVENOUS FISTULA;  Surgeon: Maeola Harman, MD;  Location: [facility] OR;  Service: Vascular;  Laterality: Left;   LOWER EXTREMITY ANGIOGRAM N/A 03/19/2012   Procedure: LOWER EXTREMITY ANGIOGRAM;  Surgeon: Kathleene Hazel, MD;  Location: [facility] CATH LAB;  Service: Cardiovascular;  Laterality: N/A;   LOWER EXTREMITY ANGIOGRAPHY N/A 06/20/2021   Procedure: LOWER EXTREMITY ANGIOGRAPHY;  Surgeon: Elder Negus, MD;  [facility];  Service: Cardiovascular;  Laterality: N/A;   NECK SURGERY     PARTIAL COLECTOMY N/A 01/13/2018   Procedure: LAPAROSCOPIC ASSISTED   SIGMOID COLECTOMY ILEOSTOMY;  Surgeon: Berna Bue, MD;  Location: [facility];  Service: General;  Laterality: N/A;   PENILE PROSTHESIS IMPLANT  08/14/05   INFRAPUBIC INSERTION OF INFLATABLE PENILE PROSTHESIS; SURGEON DR. Logan Bores   PENILE PROSTHESIS IMPLANT     PERCUTANEOUS CORONARY STENT INTERVENTION (PCI-S) Right 10/29/2014   Procedure: PERCUTANEOUS CORONARY STENT INTERVENTION (PCI-S);  Surgeon: Pamella Pert, MD;  Location: [facility] CATH LAB;  Service: Cardiovascular;  Laterality: Right;   PERIPHERAL VASCULAR INTERVENTION Left 06/23/2021   Procedure: PERIPHERAL VASCULAR INTERVENTION;  Surgeon: Elder Negus, MD;  [facility];  Service: Cardiovascular;  Laterality: Left;   REMOVAL OF PENILE PROSTHESIS N/A 06/14/2021   Procedure: Removal of THREE piece inflatable penile prosthesis;  Surgeon: Crista Elliot, MD;  Location: [facility] OR;  Service: Urology;  Laterality: N/A;   SHOULDER ARTHROSCOPY     SPINE SURGERY     TOE AMPUTATION Left    TONSILLECTOMY     TOTAL KNEE ARTHROPLASTY  07/2002   RIGHT KNEE ; SURGEON  DR. [name] ALSO HAD ARTHROSCOPIC RIGHT KNEE IN  10/2001   TOTAL KNEE ARTHROPLASTY     ULNAR NERVE TRANSPOSITION Right 10/21/2013   Procedure: RIGHT ELBOW  ULNAR NERVE DECOMPRESSION;  Surgeon: Nicki Reaper, MD;  Location: [facility];  Service: Orthopedics;  Laterality: Right;     A IV  Location/Drains/Wounds Patient Lines/Drains/Airways Status     Active Line/Drains/Airways     Name Placement date Placement time Site Days   Peripheral IV 05/28/23 18 G Right Antecubital 05/28/23  1300  Antecubital  1   Fistula / Graft Left Upper arm Arteriovenous fistula 07/21/20  0825  Upper arm  1042   Negative Pressure Wound Therapy Pretibial Left 07/04/22  0831  --  329   Pressure Injury 01/11/22  Coccyx Right;Left Stage 2 -  Partial thickness loss of dermis presenting as a shallow open injury with a red, pink wound bed without slough. 01/11/22  2159  -- 503   Pressure Injury 06/24/22 Thigh Left;Posterior Deep Tissue Pressure Injury - Purple or maroon localized area of discolored intact skin or blood-filled blister due to damage of underlying soft tissue from pressure and/or shear. 06/24/22  2043  -- 339   Wound / Incision (Open or Dehisced) 07/15/22 Skin tear Buttocks Left 07/15/22  2300  Buttocks  318   Wound / Incision (Open or Dehisced) 03/30/23 Non-pressure wound Sacrum 03/30/23  2150  Sacrum  60            Intake/Output Last 24 hours  Intake/Output Summary (Last 24 hours) at 05/29/2023 1220 Last data filed at 05/29/2023 1050 Gross per 24 hour  Intake 797.32 ml  Output 3000 ml  Net -2202.68 ml    Labs/Imaging Results for orders placed or performed during the hospital encounter of 05/28/23 (from the past 48 hour(s))  Resp panel by RT-PCR (RSV, Flu A&B, Covid) Anterior Nasal Swab     Status: None   Collection Time: 05/28/23 12:43 PM   Specimen: Anterior Nasal Swab  Result Value Ref Range   SARS Coronavirus 2 by RT PCR NEGATIVE NEGATIVE   Influenza A by PCR NEGATIVE NEGATIVE   Influenza B by PCR NEGATIVE NEGATIVE    Comment: (NOTE) The Xpert Xpress SARS-CoV-2/FLU/RSV plus assay is intended as an aid in the diagnosis of influenza from Nasopharyngeal swab specimens and should not be used as a sole basis for treatment. Nasal washings and aspirates are unacceptable for  Xpert Xpress SARS-CoV-2/FLU/RSV testing.  Fact Sheet for Patients: BloggerCourse.com  Fact Sheet for Healthcare Providers: SeriousBroker.it  This test is not yet approved or cleared by the [place] FDA and has been authorized for detection and/or diagnosis of SARS-CoV-2 by FDA under an Emergency Use Authorization (EUA). This EUA will remain in effect (meaning this test can be used) for the duration of the COVID-19 declaration under Section 564(b)(1) of the Act, 21 U.S.C. section 360bbb-3(b)(1), unless the authorization is terminated or revoked.     Resp Syncytial Virus by PCR NEGATIVE NEGATIVE    Comment: (NOTE) Fact Sheet for Patients: BloggerCourse.com  Fact Sheet for Healthcare Providers: SeriousBroker.it  This test is not yet approved or cleared by the [place] FDA and has been authorized for detection and/or diagnosis of SARS-CoV-2 by FDA under an Emergency Use Authorization (EUA). This EUA will remain in effect (meaning this test can be used) for the duration of the COVID-19 declaration under Section 564(b)(1) of the Act, 21 U.S.C. section 360bbb-3(b)(1), unless the authorization is terminated or revoked.  Performed at [facility], [address], [place], [place] [address]   CBC with Differential     Status: Abnormal   Collection Time: 05/28/23 12:43 PM  Result Value Ref Range   WBC 23.5 (H) 4.0 - 10.5 K/uL   RBC 3.21 (L) 4.22 - 5.81 MIL/uL   Hemoglobin 10.3 (L) 13.0 - 17.0 g/dL   HCT 95.2 (L) 84.1 - 32.4 %   MCV 99.7 80.0 - 100.0 fL   MCH 32.1 26.0 - 34.0 pg   MCHC 32.2 30.0 - 36.0 g/dL   RDW 40.1 02.7 - 25.3 %   Platelets 196 150 - 400 K/uL   nRBC 0.0 0.0 - 0.2 %   Neutrophils Relative % 92 %   Neutro Abs 21.5 (H) 1.7 -  7.7 K/uL   Lymphocytes Relative 3 %   Lymphs Abs 0.7 0.7 - 4.0 K/uL   Monocytes Relative 4 %   Monocytes Absolute 1.0 0.1  - 1.0 K/uL   Eosinophils Relative 0 %   Eosinophils Absolute 0.0 0.0 - 0.5 K/uL   Basophils Relative 0 %   Basophils Absolute 0.0 0.0 - 0.1 K/uL   Immature Granulocytes 1 %   Abs Immature Granulocytes 0.27 (H) 0.00 - 0.07 K/uL    Comment: Performed at [facility], [address] [place], [place] [address]  Troponin I (High Sensitivity)     Status: Abnormal   Collection Time: 05/28/23 12:43 PM  Result Value Ref Range   Troponin I (High Sensitivity) 354 (HH) <18 ng/L    Comment: CRITICAL RESULT CALLED TO, READ BACK BY AND VERIFIED WITH ISAAC EVANS,RN AT 1406 05/28/2023 BY ZBEECH. (NOTE) Elevated high sensitivity troponin I (hsTnI) values and significant  changes across serial measurements may suggest ACS but many other  chronic and acute conditions are known to elevate hsTnI results.  Refer to the "Links" section for chest pain algorithms and additional  guidance. Performed at [facility], [address] [place], [place] [address]   Lipase, blood     Status: None   Collection Time: 05/28/23 12:43 PM  Result Value Ref Range   Lipase 19 11 - 51 U/L    Comment: Performed at [facility], [address] [place], [place] [address]  Comprehensive metabolic panel     Status: Abnormal   Collection Time: 05/28/23 12:43 PM  Result Value Ref Range   Sodium 135 135 - 145 mmol/L   Potassium 6.7 (HH) 3.5 - 5.1 mmol/L    Comment: CRITICAL RESULT CALLED TO, READ BACK BY AND VERIFIED WITH ISAAC EVANS,RN AT 1407 05/28/2023 BY ZBEECH.   Chloride 97 (L) 98 - 111 mmol/L   CO2 25 22 - 32 mmol/L   Glucose, Bld 159 (H) 70 - 99 mg/dL    Comment: Glucose reference range applies only to samples taken after fasting for at least 8 hours.   BUN 48 (H) 8 - 23 mg/dL   Creatinine, Ser 4.69 (H) 0.61 - 1.24 mg/dL   Calcium 8.2 (L) 8.9 - 10.3 mg/dL   Total Protein 6.1 (L) 6.5 - 8.1 g/dL   Albumin 2.7 (L) 3.5 - 5.0 g/dL   AST 18 15 - 41 U/L   ALT 18 0 - 44 U/L   Alkaline Phosphatase 97 38  - 126 U/L   Total Bilirubin 0.7 0.3 - 1.2 mg/dL   GFR, Estimated 8 (L) >60 mL/min    Comment: (NOTE) Calculated using the CKD-EPI Creatinine Equation (2021)    Anion gap 13 5 - 15    Comment: Performed at [facility], [address], [place], [place] [address]  Ethanol     Status: None   Collection Time: 05/28/23 12:43 PM  Result Value Ref Range   Alcohol, Ethyl (B) <10 <10 mg/dL    Comment: (NOTE) Lowest detectable limit for serum alcohol is 10 mg/dL.  For medical purposes only. Performed at [facility], [address] [place], [place] [address]   Culture, blood (routine x 2)     Status: None (Preliminary result)   Collection Time: 05/28/23  1:00 PM   Specimen: BLOOD  Result Value Ref Range   Specimen Description BLOOD SITE NOT SPECIFIED    Special Requests  BOTTLES DRAWN AEROBIC AND ANAEROBIC Blood Culture adequate volume   Culture      NO GROWTH < 24 HOURS Performed at [facility], [address] [place], [place] [address]    Report Status PENDING   I-stat chem 8, ED     Status: Abnormal   Collection Time: 05/28/23  1:17 PM  Result Value Ref Range   Sodium 134 (L) 135 - 145 mmol/L   Potassium 6.4 (HH) 3.5 - 5.1 mmol/L   Chloride 99 98 - 111 mmol/L   BUN 43 (H) 8 - 23 mg/dL   Creatinine, Ser 6.57 (H) 0.61 - 1.24 mg/dL   Glucose, Bld 846 (H) 70 - 99 mg/dL    Comment: Glucose reference range applies only to samples taken after fasting for at least 8 hours.   Calcium, Ion 1.02 (L) 1.15 - 1.40 mmol/L   TCO2 24 22 - 32 mmol/L   Hemoglobin 10.9 (L) 13.0 - 17.0 g/dL   HCT 96.2 (L) 95.2 - 84.1 %   Comment NOTIFIED PHYSICIAN   I-Stat venous blood gas, ED     Status: Abnormal   Collection Time: 05/28/23  1:17 PM  Result Value Ref Range   pH, Ven 7.390 7.25 - 7.43   pCO2, Ven 42.6 (L) 44 - 60 mmHg   pO2, Ven 56 (H) 32 - 45 mmHg   Bicarbonate 25.8 20.0 - 28.0 mmol/L   TCO2 27 22 - 32 mmol/L   O2 Saturation 88 %   Acid-Base Excess 1.0 0.0 - 2.0  mmol/L   Sodium 134 (L) 135 - 145 mmol/L   Potassium 6.5 (HH) 3.5 - 5.1 mmol/L   Calcium, Ion 1.02 (L) 1.15 - 1.40 mmol/L   HCT 32.0 (L) 39.0 - 52.0 %   Hemoglobin 10.9 (L) 13.0 - 17.0 g/dL   Sample type VENOUS    Comment NOTIFIED PHYSICIAN   CBG monitoring, ED     Status: Abnormal   Collection Time: 05/28/23  1:20 PM  Result Value Ref Range   Glucose-Capillary 145 (H) 70 - 99 mg/dL    Comment: Glucose reference range applies only to samples taken after fasting for at least 8 hours.  I-Stat Lactic Acid     Status: None   Collection Time: 05/28/23  1:23 PM  Result Value Ref Range   Lactic Acid, Venous 1.9 0.5 - 1.9 mmol/L  Culture, blood (routine x 2)     Status: None (Preliminary result)   Collection Time: 05/28/23  1:53 PM   Specimen: BLOOD RIGHT ARM  Result Value Ref Range   Specimen Description BLOOD RIGHT ARM    Special Requests      BOTTLES DRAWN AEROBIC ONLY Blood Culture adequate volume   Culture      NO GROWTH < 24 HOURS Performed at [facility], [address], [place], [place] [address]    Report Status PENDING   I-Stat Lactic Acid     Status: Abnormal   Collection Time: 05/28/23  2:18 PM  Result Value Ref Range   Lactic Acid, Venous 2.1 (HH) 0.5 - 1.9 mmol/L   Comment NOTIFIED PHYSICIAN   Urinalysis, w/ Reflex to Culture (Infection Suspected) -Urine, Clean Catch     Status: Abnormal   Collection Time: 05/28/23  2:27 PM  Result Value Ref Range   Specimen Source URINE, CLEAN CATCH    Color, Urine YELLOW YELLOW   APPearance CLEAR CLEAR   Specific Gravity, Urine 1.014 1.005 - 1.030  pH 8.0 5.0 - 8.0   Glucose, UA 150 (A) NEGATIVE mg/dL   Hgb urine dipstick NEGATIVE NEGATIVE   Bilirubin Urine NEGATIVE NEGATIVE   Ketones, ur NEGATIVE NEGATIVE mg/dL   Protein, ur >=938 (A) NEGATIVE mg/dL   Nitrite NEGATIVE NEGATIVE   Leukocytes,Ua TRACE (A) NEGATIVE   RBC / HPF 6-10 0 - 5 RBC/hpf   WBC, UA 21-50 0 - 5 WBC/hpf    Comment:        Reflex urine culture not  performed if WBC <=10, OR if Squamous epithelial cells >5. If Squamous epithelial cells >5 suggest recollection.    Bacteria, UA NONE SEEN NONE SEEN   Squamous Epithelial / HPF 0-5 0 - 5 /HPF    Comment: Performed at [facility], [address], [place], [place] [address]  Troponin I (High Sensitivity)     Status: Abnormal   Collection Time: 05/28/23  4:46 PM  Result Value Ref Range   Troponin I (High Sensitivity) 633 (HH) <18 ng/L    Comment: CRITICAL VALUE NOTED. VALUE IS CONSISTENT WITH PREVIOUSLY REPORTED/CALLED VALUE (NOTE) Elevated high sensitivity troponin I (hsTnI) values and significant  changes across serial measurements may suggest ACS but many other  chronic and acute conditions are known to elevate hsTnI results.  Refer to the "Links" section for chest pain algorithms and additional  guidance. Performed at [facility], [address], [place], [place] [address]   CBG monitoring, ED     Status: Abnormal   Collection Time: 05/28/23  4:48 PM  Result Value Ref Range   Glucose-Capillary 128 (H) 70 - 99 mg/dL    Comment: Glucose reference range applies only to samples taken after fasting for at least 8 hours.  Ammonia     Status: None   Collection Time: 05/28/23  5:48 PM  Result Value Ref Range   Ammonia 20 9 - 35 umol/L    Comment: Performed at [facility], [address], [place], [place] [address]  Vitamin B12     Status: None   Collection Time: 05/28/23  5:48 PM  Result Value Ref Range   Vitamin B-12 411 180 - 914 pg/mL    Comment: (NOTE) This assay is not validated for testing neonatal or myeloproliferative syndrome specimens for Vitamin B12 levels. Performed at [facility], [address] [place], [place] [address]   TSH     Status: None   Collection Time: 05/28/23  5:48 PM  Result Value Ref Range   TSH 0.771 0.350 - 4.500 uIU/mL    Comment: Performed by a 3rd Generation assay with a functional sensitivity of <=0.01  uIU/mL. Performed at [facility], [address], [place], [place] [address]   CBG monitoring, ED     Status: Abnormal   Collection Time: 05/28/23  5:51 PM  Result Value Ref Range   Glucose-Capillary 126 (H) 70 - 99 mg/dL    Comment: Glucose reference range applies only to samples taken after fasting for at least 8 hours.  Hepatitis B surface antigen     Status: None   Collection Time: 05/28/23  8:45 PM  Result Value Ref Range   Hepatitis B Surface Ag NON REACTIVE NON REACTIVE    Comment: Performed at [facility], [address], [place], [place] [address]  Hepatitis panel, acute     Status: None   Collection Time: 05/28/23 11:00 PM  Result Value Ref Range   Hepatitis  B Surface Ag NON REACTIVE NON REACTIVE   HCV Ab NON REACTIVE NON REACTIVE    Comment: (NOTE) Nonreactive HCV antibody screen is consistent with no HCV infections,  unless recent infection is suspected or other evidence exists to indicate HCV infection.     Hep A IgM NON REACTIVE NON REACTIVE   Hep B C IgM NON REACTIVE NON REACTIVE    Comment: Performed at [facility], [address] [place], [place] [address]  Basic metabolic panel     Status: Abnormal   Collection Time: 05/29/23  4:19 AM  Result Value Ref Range   Sodium 132 (L) 135 - 145 mmol/L   Potassium 4.5 3.5 - 5.1 mmol/L   Chloride 97 (L) 98 - 111 mmol/L   CO2 22 22 - 32 mmol/L   Glucose, Bld 117 (H) 70 - 99 mg/dL    Comment: Glucose reference range applies only to samples taken after fasting for at least 8 hours.   BUN 27 (H) 8 - 23 mg/dL   Creatinine, Ser 9.14 (H) 0.61 - 1.24 mg/dL   Calcium 8.0 (L) 8.9 - 10.3 mg/dL   GFR, Estimated 15 (L) >60 mL/min    Comment: (NOTE) Calculated using the CKD-EPI Creatinine Equation (2021)    Anion gap 13 5 - 15    Comment: Performed at [facility], [address], [place], [place] [address]  CBC     Status: Abnormal   Collection Time: 05/29/23  4:19 AM  Result Value Ref Range   WBC  15.3 (H) 4.0 - 10.5 K/uL   RBC 3.25 (L) 4.22 - 5.81 MIL/uL   Hemoglobin 10.2 (L) 13.0 - 17.0 g/dL   HCT 62.1 (L) 30.8 - 65.7 %   MCV 99.4 80.0 - 100.0 fL   MCH 31.4 26.0 - 34.0 pg   MCHC 31.6 30.0 - 36.0 g/dL   RDW 84.6 96.2 - 95.2 %   Platelets 171 150 - 400 K/uL   nRBC 0.0 0.0 - 0.2 %    Comment: Performed at [facility], [address], [place], [place] [address]  CBG monitoring, ED     Status: Abnormal   Collection Time: 05/29/23  7:59 AM  Result Value Ref Range   Glucose-Capillary 130 (H) 70 - 99 mg/dL    Comment: Glucose reference range applies only to samples taken after fasting for at least 8 hours.  Troponin I (High Sensitivity)     Status: Abnormal   Collection Time: 05/29/23  9:19 AM  Result Value Ref Range   Troponin I (High Sensitivity) 574 (HH) <18 ng/L    Comment: CRITICAL VALUE NOTED. VALUE IS CONSISTENT WITH PREVIOUSLY REPORTED/CALLED VALUE (NOTE) Elevated high sensitivity troponin I (hsTnI) values and significant  changes across serial measurements may suggest ACS but many other  chronic and acute conditions are known to elevate hsTnI results.  Refer to the "Links" section for chest pain algorithms and additional  guidance. Performed at [facility], [address], [place], [place] [address]    *Note: Due to a large number of results and/or encounters for the requested time period, some results have not been displayed. A complete set of results can be found in Results Review.   CT CHEST ABDOMEN PELVIS WO CONTRAST  Result Date: 05/28/2023 CLINICAL DATA:  Abdominal pain. Decreased responsiveness. Fever. Dialysis patient. EXAM: CT CHEST, ABDOMEN AND PELVIS WITHOUT CONTRAST TECHNIQUE: Multidetector CT imaging of the chest, abdomen and pelvis was performed  following the standard protocol without IV contrast. RADIATION DOSE REDUCTION: This exam was performed according to the departmental dose-optimization program which includes automated exposure control,  adjustment of the mA and/or kV according to patient size and/or use of iterative reconstruction technique. COMPARISON:  Abdominopelvic CT 07/03/2022 FINDINGS: CT CHEST FINDINGS Cardiovascular: Borderline cardiomegaly. There are coronary artery calcifications. No pericardial effusion. Moderate aortic atherosclerosis. No aortic aneurysm. Mediastinum/Nodes: Scattered small mediastinal lymph nodes, not enlarged by size criteria. Assessment for hilar adenopathy is limited on this unenhanced exam. There is no esophageal wall thickening. Lungs/Pleura: Dependent atelectasis in the lung bases and perifissural upper lobes. Minimal retained mucus and debris within the distal trachea and central airways. Trace pleural thickening without significant effusion. Musculoskeletal: Thoracic spondylosis with spurring. Remote posterior left rib fractures. There are no acute or suspicious osseous abnormalities. No obvious musculoskeletal infection on this unenhanced exam. There is mild generalized subcutaneous edema. CT ABDOMEN PELVIS FINDINGS Hepatobiliary: Cholecystectomy. No definite biliary dilatation, mild motion artifact. Unremarkable unenhanced appearance of the liver. Pancreas: Parenchymal atrophy. No ductal dilatation or inflammation. The known cystic structure in the pancreatic body measuring 13 mm is not well-defined on the current exam. This is grossly stable. Spleen: Enlarged spanning 15 cm cranial caudal. Adrenals/Urinary Tract: No adrenal nodule. Bilateral renal parenchymal atrophy. There is perinephric stranding. No hydronephrosis. No definite intrarenal calculi. Both ureters are decompressed. Urinary bladder is only minimally distended. Stomach/Bowel: The stomach is decompressed. There is a small duodenal diverticulum. No bowel obstruction or inflammation. Colonic diverticulosis without diverticulitis. Moderate colonic stool burden. The appendix is not definitively seen. Vascular/Lymphatic: Advanced aortic and branch  atherosclerosis. There multiple small retroperitoneal and periportal nodes, not enlarged by size criteria. There is an enlarged right external iliac node at 14 mm. This is slightly enlarged from prior exam. Reproductive: Prostate is unremarkable. Other: No free air or ascites. Postsurgical change of the anterior abdominal wall. Small fat containing supraumbilical ventral abdominal wall hernia. Suspect scarring in the right lower abdomen. There is mild subcutaneous edema of the right anterior upper thigh, series 3, image 25. No soft tissue gas. No focal fluid collection. Musculoskeletal: L4-S1 lumbar fusion hardware. Degenerative change in the upper lumbar spine. No acute osseous findings. No findings to suggest musculoskeletal infection on this unenhanced exam. IMPRESSION: 1. Mild subcutaneous edema of the right anterior upper thigh, can be seen with cellulitis. No soft tissue gas or focal fluid collection. 2. No other acute findings or explanation for fever. 3. Minimal retained mucus and debris within the distal trachea and central airways, can be seen with bronchitis or aspiration. Minor dependent atelectasis in both lungs. 4. Mildly enlarged right external iliac node at 14 mm, slightly enlarged from prior exam is nonspecific, likely reactive. 5. Colonic diverticulosis without diverticulitis. 6. Splenomegaly. 7. The known cystic structure in the pancreatic body measuring 13 mm is not well-defined on the current exam, however grossly stable from prior imaging. Given stability over multiple exams, no specific imaging follow-up is needed. Aortic Atherosclerosis (ICD10-I70.0). Electronically Signed   By: Narda Rutherford M.D.   On: 05/28/2023 18:11   CT Head Wo Contrast  Result Date: 05/28/2023 CLINICAL DATA:  Altered mental status EXAM: CT HEAD WITHOUT CONTRAST TECHNIQUE: Contiguous axial images were obtained from the base of the skull through the vertex without intravenous contrast. RADIATION DOSE REDUCTION:  This exam was performed according to the departmental dose-optimization program which includes automated exposure control, adjustment of the mA and/or kV according to patient size and/or use of iterative reconstruction  technique. COMPARISON:  04/02/2023 FINDINGS: Brain: Periventricular white matter and corona radiata hypodensities favor chronic ischemic microvascular white matter disease. Suspected remote prior strokes in the left frontal lobe and right parietal vertex, appearance not changed from 04/02/2023. Otherwise, the brainstem, cerebellum, cerebral peduncles, thalamus, basal ganglia, basilar cisterns, and ventricular system appear within normal limits. No intracranial hemorrhage, mass lesion, or acute CVA. Vascular: There is atherosclerotic calcification of the cavernous carotid arteries bilaterally. Skull: Unremarkable Sinuses/Orbits: Unremarkable Other: No supplemental non-categorized findings. IMPRESSION: 1. No acute intracranial findings. 2. Periventricular white matter and corona radiata hypodensities favor chronic ischemic microvascular white matter disease. 3. Suspected remote prior strokes in the left frontal lobe and right parietal vertex. 4. Atherosclerosis. Electronically Signed   By: Gaylyn Rong M.D.   On: 05/28/2023 18:03   DG Chest Port 1 View  Result Date: 05/28/2023 CLINICAL DATA:  Fever, altered level of consciousness EXAM: PORTABLE CHEST 1 VIEW COMPARISON:  04/02/2023 FINDINGS: Single frontal view of the chest demonstrates an unremarkable cardiac silhouette. There is patchy bibasilar airspace disease, right greater than left. No effusion or pneumothorax. No acute bony abnormalities. IMPRESSION: 1. Patchy bibasilar airspace disease greatest at the right lung base, which could reflect pneumonia given clinical presentation. Electronically Signed   By: Sharlet Salina M.D.   On: 05/28/2023 14:55    Pending Labs Unresulted Labs (From admission, onward)     Start     Ordered    05/30/23 0500  CBC  Tomorrow morning,   R        05/29/23 0829   05/30/23 0500  Basic metabolic panel  Tomorrow morning,   R        05/29/23 0829   05/29/23 1059  Phosphorus  Add-on,   AD        05/29/23 1058   05/28/23 1943  Hepatitis B surface antibody,quantitative  (New Admission Hemo Labs (Hepatitis B))  Once,   R        05/28/23 1943   05/28/23 1803  Strep pneumoniae urinary antigen  (COPD / Pneumonia / Cellulitis / Lower Extremity Wound)  Once,   R        05/28/23 1805   05/28/23 1803  Legionella Pneumophila Serogp 1 Ur Ag  (COPD / Pneumonia / Cellulitis / Lower Extremity Wound)  Once,   R        05/28/23 1805   05/28/23 1427  Urine Culture  Once,   R        05/28/23 1427   05/28/23 1243  Urine Culture  Once,   URGENT       Question:  Indication  Answer:  Sepsis   05/28/23 1243            Vitals/Pain Today's Vitals   05/29/23 0632 05/29/23 0757 05/29/23 1014 05/29/23 1023  BP:  (!) 168/63 (!) 171/60   Pulse: 87 82 81   Resp:  18 16   Temp:  99.6 F (37.6 C) 99.2 F (37.3 C)   TempSrc:  Oral Oral   SpO2: 99% 98% 96%   PainSc:  6   5     Isolation Precautions No active isolations  Medications Medications  metroNIDAZOLE (FLAGYL) IVPB 500 mg (0 mg Intravenous Stopped 05/29/23 0515)  insulin aspart (novoLOG) injection 0-6 Units ( Subcutaneous Not Given 05/29/23 0802)  nicotine (NICODERM CQ - dosed in mg/24 hours) patch 21 mg (21 mg Transdermal Patch Applied 05/29/23 0346)  Chlorhexidine Gluconate Cloth 2 % PADS 6 each (6 each  Topical Not Given 05/29/23 0645)  sodium chloride flush (NS) 0.9 % injection 3 mL (3 mLs Intravenous Given 05/29/23 1023)  sodium chloride flush (NS) 0.9 % injection 3 mL (has no administration in time range)  0.9 %  sodium chloride infusion (has no administration in time range)  acetaminophen (TYLENOL) tablet 650 mg (650 mg Oral Given 05/29/23 0914)    Or  acetaminophen (TYLENOL) suppository 650 mg ( Rectal See Alternative 05/29/23 0914)   ondansetron (ZOFRAN) tablet 4 mg (has no administration in time range)    Or  ondansetron (ZOFRAN) injection 4 mg (has no administration in time range)  heparin injection 5,000 Units (has no administration in time range)  cefTRIAXone (ROCEPHIN) 2 g in sodium chloride 0.9 % 100 mL IVPB (0 g Intravenous Stopped 05/29/23 1050)  doxycycline (VIBRA-TABS) tablet 100 mg (100 mg Oral Given 05/29/23 1018)  doxercalciferol (HECTOROL) injection 1 mcg (has no administration in time range)  Chlorhexidine Gluconate Cloth 2 % PADS 6 each (has no administration in time range)  acetaminophen (TYLENOL) suppository 650 mg (650 mg Rectal Given 05/28/23 1402)  vancomycin (VANCOREADY) IVPB 2000 mg/400 mL (0 mg Intravenous Stopped 05/28/23 1642)  ceFEPIme (MAXIPIME) 2 g in sodium chloride 0.9 % 100 mL IVPB (0 g Intravenous Stopped 05/28/23 1426)  insulin aspart (novoLOG) injection 5 Units (5 Units Intravenous Given 05/28/23 1402)    And  dextrose 50 % solution 50 mL (50 mLs Intravenous Given 05/28/23 1359)  sodium bicarbonate injection 50 mEq (50 mEq Intravenous Given 05/28/23 1359)  calcium gluconate 1 g/ 50 mL sodium chloride IVPB (0 mg Intravenous Stopped 05/28/23 1417)  sodium chloride 0.9 % bolus 500 mL (0 mLs Intravenous Stopped 05/28/23 2010)    Mobility power wheelchair     Focused Assessments See chart   R Recommendations: See Admitting Provider Note  Report given to:   Additional Notes: see chart

## 2023-05-29 NOTE — Progress Notes (Signed)
[facility] Progress Note  Subjective: seen in ED bed, no c/o's.   Vitals:   05/29/23 0631 05/29/23 0632 05/29/23 0757 05/29/23 1014  BP:   (!) 168/63 (!) 171/60  Pulse: 87 87 82 81  Resp:   18 16  Temp:   99.6 F (37.6 C) 99.2 F (37.3 C)  TempSrc:   Oral Oral  SpO2: 99% 99% 98% 96%    Exam: Gen no distress, lying 30 deg in ED stretcher No jvd or bruits Chest clear bilat to bases RRR no MRG Abd soft ntnd no mass or ascites +bs MS bilat BKA Ext 1-2+ bilat LE > UE edema Neuro is lethargic but awakens easily, Ox 2 , nf    L AVF+bruit     Home meds include - amiodarone, norvasc 5 mg, eliquis, lipitor, colace, pepcid, fentanyl patch, gabapentin, hydralazine 50 tid, icosapent, insulin aspart/ glargine, imdur 60, labetalol, reglan, midodrine 10mg  prn, sl ntg, percocet, requip, flomax, prns/ vits/ supps     OP HD: [facility]   4h  450 /800  107.9kg  2/2 bath  L AVF  Heparin none - last OP HD 9/14, post wt 115.7kg - venofer 100 mg three times per week thru 10/05 - hectorol 1 mcg IV three times per week - mircera 75 mcg q 2 wks, last 9/14, due 9/28 - came off 3-7kg over most of the last 3 wks      CXR 9/17 - IMPRESSION: Patchy bibasilar airspace disease greatest at the right lung base, which could reflect pneumonia given clinical presentation     Assessment/ Plan: Sepsis - w/ fevers, Reina Fuse, AMS. Cx's sent and IV abx started per pmd. CXR w/ poss PNA. CT a/p pending. Per pmd.  Hyperkalemia - resolved w/ HD  Acute hypoxic resp failure/ vol overload/ poss pna - [place] O2 at 2 lpm. 3 L  off w/ HD last night. Cont to lower vol.  ESRD - on HD TTS. Missed 1 HD last week on 9/12. Had HD last night here. Next HD Thursday.  HTN - BP's low yesterday, improving now. Resume home BP meds as needed per pmd.  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 Follow.  MBD ckd - CCa in range. Follow.  Chronic pain - on fentanyl patch and prn percocet at home.  AMS - multifactorial PAF -  on eliquis and amiodarone      Vinson Moselle MD  [facility] 05/29/2023, 10:51 AM  Recent Labs  Lab 05/28/23 1243 05/28/23 1317 05/29/23 0419  HGB 10.3* 10.9*  10.9* 10.2*  ALBUMIN 2.7*  --   --   CALCIUM 8.2*  --  8.0*  CREATININE 7.32* 7.90* 4.18*  K 6.7* 6.5*  6.4* 4.5   No results for input(s): "IRON", "TIBC", "FERRITIN" in the last 168 hours. Inpatient medications:  Chlorhexidine Gluconate Cloth  6 each Topical Q0600   doxycycline  100 mg Oral Q12H   heparin injection (subcutaneous)  5,000 Units Subcutaneous Q8H   insulin aspart  0-6 Units Subcutaneous TID WC   nicotine  21 mg Transdermal QHS   sodium chloride flush  3 mL Intravenous Q12H    sodium chloride     cefTRIAXone (ROCEPHIN)  IV 2 g (05/29/23 1020)   metronidazole Stopped (05/29/23 0515)   sodium chloride, acetaminophen **OR** acetaminophen, ondansetron **OR** ondansetron (ZOFRAN) IV, sodium chloride flush

## 2023-05-29 NOTE — ED Notes (Signed)
Checked patient blood sugar it was 126 notified RN of blood sugar patient is resting with call bell in reach

## 2023-05-29 NOTE — Progress Notes (Signed)
Pt receives out-pt HD at [facility] on [facility]TS. Will assist as needed.   Olivia Canter Renal Navigator [phone]

## 2023-05-29 NOTE — TOC Initial Note (Signed)
Transition of Care [facility]) - Initial/Assessment Note    Patient Details  Name: [name] MRN: [id number] Date of Birth: 1956/07/17  [facility] CM/SW Contact:    Mearl Latin, LCSW Phone Number: 05/29/2023, 4:41 PM  Clinical Narrative:                 Patient was admitted from [facility] long term care. CSW will continue to follow.   Expected Discharge Plan: Skilled Nursing Facility Barriers to Discharge: Continued Medical Work up   Patient Goals and CMS Choice            Expected Discharge Plan and Services In-house Referral: Clinical Social Work   Post Acute Care Choice: Skilled Nursing Facility Living arrangements for the past 2 months: Skilled Nursing Facility                                      Prior Living Arrangements/Services Living arrangements for the past 2 months: Skilled Nursing Facility Lives with:: Facility Resident                   Activities of Daily Living Home Assistive Devices/Equipment: None ADL Screening (condition at time of admission) Patient's cognitive ability adequate to safely complete daily activities?: Yes Is the patient deaf or have difficulty hearing?: No Does the patient have difficulty seeing, even when wearing glasses/contacts?: No Does the patient have difficulty concentrating, remembering, or making decisions?: Yes Patient able to express need for assistance with ADLs?: Yes Does the patient have difficulty dressing or bathing?: Yes Independently performs ADLs?: No Communication: Independent Dressing (OT): Needs assistance Is this a change from baseline?: Pre-admission baseline Grooming: Needs assistance Is this a change from baseline?: Pre-admission baseline Feeding: Independent Bathing: Needs assistance Is this a change from baseline?: Pre-admission baseline Toileting: Needs assistance Is this a change from baseline?: Pre-admission baseline In/Out Bed: Needs assistance Is this a change  from baseline?: Pre-admission baseline Walks in Home: Needs assistance Is this a change from baseline?: Pre-admission baseline Does the patient have difficulty walking or climbing stairs?: Yes Weakness of Legs: None (bilateral BKA) Weakness of Arms/Hands: None  Permission Sought/Granted                  Emotional Assessment       Orientation: : Oriented to Self, Oriented to Place, Oriented to  Time, Oriented to Situation Alcohol / Substance Use: Not Applicable Psych Involvement: No (comment)  Admission diagnosis:  Severe sepsis (HCC) [A41.9, R65.20] Sepsis, due to unspecified organism, unspecified whether acute organ dysfunction present [facility]) [A41.9] Patient Active Problem List   Diagnosis Date Noted   Severe sepsis (HCC) 05/28/2023   Acute encephalopathy 05/28/2023   Acute respiratory failure with hypoxia (HCC) 05/28/2023   Benign hypertension 04/01/2023   Precordial pain 03/31/2023   History of right coronary artery stent placement 03/31/2023   Current use of long term anticoagulation 03/31/2023   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) 03/31/2023   Paroxysmal atrial fibrillation (HCC) 03/31/2023   Hypertensive crisis 03/31/2023   Unstable angina (HCC) 03/30/2023   Tobacco abuse 03/30/2023   Prolonged QT interval 03/30/2023   Elevated troponin 03/30/2023   Cellulitis 11/20/2022   Chest wall pain 07/04/2022   S/P BKA (below knee amputation) unilateral, left (HCC) 07/04/2022   Subacute osteomyelitis, left ankle and foot (HCC) 07/03/2022   Physical deconditioning 01/13/2022  Anemia of renal disease 01/13/2022   Obesity (BMI 30-39.9) 01/13/2022   Decubitus ulcer of buttock 01/13/2022   Intractable nausea and vomiting with epigastric pain 01/11/2022   Chronic osteomyelitis (HCC) 01/11/2022   Proliferative diabetic retinopathy of left eye with macular edema associated with type 2 diabetes mellitus (HCC) 11/27/2021   Vitreous hemorrhage of  right eye (HCC) 11/27/2021   Sepsis (HCC) 09/02/2021   Severe sepsis with lactic acidosis (HCC) 06/14/2021   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) 06/14/2021   ESRD on hemodialysis with hyperkalemia 06/14/2021   Paroxysmal atrial fibrillation with RVR (HCC) 06/14/2021   Carotid stenosis 06/15/2020   Dyslipidemia 06/15/2020   Diabetic macular edema of right eye with proliferative retinopathy associated with type 2 diabetes mellitus (HCC) 01/18/2020   Severe nonproliferative diabetic retinopathy of left eye, with macular edema, associated with type 2 diabetes mellitus (HCC) 01/04/2020   Retinal hemorrhage of right eye 01/04/2020   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   Pseudoclaudication 11/15/2018   S/P BKA (below knee amputation), right (HCC) 09/18/2018   Amputation of toe of left foot (HCC) 09/18/2018   Peripheral artery disease (HCC) 09/18/2018   S/P carotid endarterectomy 09/18/2018   OSA not tolerating CPAP 09/18/2018   Chronic back pain 09/18/2018   Status post reversal of ileostomy 05/21/2018   Normocytic anemia 02/14/2018   Intra-abdominal abscess (HCC) 11/04/2017   Chronic venous hypertension (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 (HCC) 12/11/2016   PAD (peripheral artery disease) (HCC) 11/12/2016   Unilateral primary osteoarthritis, left knee 10/11/2016   History of right below knee amputation (HCC) 07/12/2016   Infected diabetic left foot ulcer with chronic osteomyelitis    Diabetic foot infection (HCC) 05/04/2016   Insulin dependent type 2 diabetes mellitus (HCC) 12/16/2015   Amputated toe (HCC) 10/21/2015   Cellulitis of left lower extremity 09/03/2015   Leg edema, left 09/03/2015   CAD (dz of distal, mid and proximal RCA with implantation of 3 overlapping drug-eluting stent, with elevated troponin 09/03/2015   Chronic diastolic heart failure, NYHA class 2 (HCC) 09/03/2015   Hyperlipidemia 08/25/2014   Limb pain 03/20/2013    DJD (degenerative joint disease) 09/25/2012   Migraine 09/25/2012   Neuropathy 09/25/2012   Restless legs syndrome (RLS) 09/25/2012   Chronic obstructive pulmonary disease, unspecified (HCC) 04/25/2012   Unknown cause of morbidity or mortality 04/25/2012   Chronic total occlusion of artery of the extremities (HCC) 04/08/2012   Onychomycosis 02/01/2012   Occlusion and stenosis of carotid artery without mention of cerebral infarction 06/12/2011   Barrett's esophagus without dysplasia 05/08/2011   Former tobacco use 02/01/2011   PCP:  Lonie Peak, PA-C Pharmacy:   [id number] [place], [place] - [address] AT [address] [place] [address] Phone: [phone] Fax: [phone]  [id number] - [place], [place] - [address] [address] Phone: [phone] Fax: [phone]  [facility] - [place], [place] - [address] [place] [address] Phone: [phone] Fax: [phone]     Social Determinants of Health (SDOH) Social History: SDOH Screenings   Food Insecurity: No Food Insecurity (05/28/2023)  Housing: Low Risk  (05/28/2023)  Transportation Needs: No Transportation Needs (05/28/2023)  Utilities: Not At Risk (05/28/2023)  Tobacco Use: High Risk (04/24/2023)   SDOH Interventions:     Readmission Risk Interventions    05/29/2023    4:41 PM 06/30/2021   11:53 AM  Readmission Risk Prevention  Plan  Transportation Screening Complete Complete  Medication Review Oceanographer) Complete Complete  PCP or Specialist appointment within 3-5 days of discharge Complete   HRI or Home Care Consult Complete Complete  SW Recovery Care/Counseling Consult Complete Complete  Palliative Care Screening Not Applicable Not Applicable  Skilled Nursing Facility Complete Not Applicable

## 2023-05-29 NOTE — ED Notes (Signed)
Patient transported to MRI

## 2023-05-29 NOTE — Progress Notes (Signed)
Pt unable to lay flat due to pain. Moved pt over to MR table and he stated that his back and neck were hurting and was unable to hold still. He then refused the exam. Pt was then moved back to ED stretcher and sent back to ED.

## 2023-05-29 NOTE — ED Notes (Signed)
Pt stated to staff that no one had been in his room all day however this RN, ED techs & providers have all been at beside repeatedly.  Pt confused as to what medications he has been given, this RN went through all medications given to pt throughout stay 3 times this morning.  Pt on the phone with wife Lawson Fiscal who requested provider call her, provider made aware.

## 2023-05-29 NOTE — Progress Notes (Signed)
PROGRESS NOTE  [name]  XBM:[id number] DOB: [birth year] DOA: 05/28/2023 PCP: Lonie Peak, PA-C   Brief Narrative: Patient is a 67 year old male with the history of ESRD on dialysis, type 2 diabetes, coronary artery disease, hypertension, hyperlipidemia, GERD, peripheral vascular disease status post bilateral BKA, diabetic retinopathy, OSA but not tolerating CPAP, chronic back pain, paroxysmal A-fib on Eliquis, GERD who presented with confusion.  He was sent from his SNF for Concern of underlying Infectious process.  Patient reported being more weak, coughing.  No report of fever or chills at home.  On.  Recently was febrile, blood pressure stable.  He was saturating 88% on room air.  Labs show WBC of 23.5, potassium of 6.7, creatinine of 7.3.  Chest x-ray showed patchy bibasilar airspace disease more on the right, suspicious for pneumonia.  Patient was started on  antibiotics and admitted for further management.  Assessment & Plan:  Principal Problem:   Severe sepsis (HCC) Active Problems:   ESRD on hemodialysis with hyperkalemia   Acute respiratory failure with hypoxia (HCC)   Acute encephalopathy   CAD (dz of distal, mid and proximal RCA with implantation of 3 overlapping drug-eluting stent, with elevated troponin   Insulin dependent type 2 diabetes mellitus (HCC)   Paroxysmal atrial fibrillation (HCC)   Benign hypertension   Peripheral artery disease (HCC)   Hyperlipidemia   Chronic diastolic heart failure, NYHA class 2 (HCC)   Barrett's esophagus without dysplasia   Anemia of renal disease   Chronic back pain   Elevated troponin   Severe sepsis: Presented with confusion, fever, weakness.  Found to be febrile on presentation with leukocytosis, elevated lactate.  Given 500 cc of IV fluid in the emergency department.  Chest x-ray suspicious for bibasilar pneumonia.  Currently on broad spectrum antibiotics.  Sepsis could be secondary to pneumonia.  Follow-up cultures.  Afebrile  this morning. Patient also has a open wound on the right amputation stump but it appears superficial without any drainage or erythema.  CT chest/abdomen/pelvis did not show any clear signs of underlying infectious process but showed mild subcutaneous edema of the right anterior upper thigh.  On examination there is superficial ulcer on the right BKA stump but no signs of cellulitis.  Continue current antibiotics.  He has mild leukocytosis.  Acute hypoxic respiratory failure/ CAP: Continue antibiotics.  On 2 L of oxygen per minute, try to wean the oxygen.  Not on oxygen at home.  Confusion: CT head did not show any acute findings.  Pending MRI.  Likely from sepsis.  Currently he is alert and oriented  Hyponatremia: Likely from volume overload.  Volume management as per dialysis.  ESRD on dialysis/hyperkalemia: Nephrology following.  Hyperkalemia has resolved after he was given calcium gluconate, sodium bicarb.  He has dialysis access on the left arm  Elevated troponin/coronary artery disease: Troponin of 63 on presentation.  His chest pain is atypical, complains of chest pain bilaterally.  Likely this is demand ischemia , patient is ESRD.  Will check troponin again.  ED physician discussed with cardiology, recommended to continue to monitor without any intervention.  He follows with Dr. Jacinto Halim as an outpatient.  On Norvasc, labetalol, hydralazine, Imdur  Peripheral artery disease: Bilateral below-knee amputation.  On Eliquis, Lipitor  Chronic diastolic CHF: Volume management as per dialysis.  Echo on 4/21 showed normal EF, grade 1 diastolic dysfunction  Hyperlipidemia: On Lipitor  History of GERD: Continue PPI  Normocytic anemia: Hemoglobin stable  Chronic back pain: On fentanyl  patch at home.  Also on gabapentin      DVT prophylaxis:heparin [place]     Code Status: Full Code  Family Communication: None at bedside  Patient status:Inpatient  Patient is from :SNF  Anticipated discharge  to:SnF  Estimated DC date:1-2 days   Consultants: None  Procedures:None  Antimicrobials:  Anti-infectives (From admission, onward)    Start     Dose/Rate Route Frequency Ordered Stop   05/28/23 1700  metroNIDAZOLE (FLAGYL) IVPB 500 mg        500 mg 100 mL/hr over 60 Minutes Intravenous Every 12 hours 05/28/23 1649     05/28/23 1700  metroNIDAZOLE (FLAGYL) IVPB 500 mg  Status:  Discontinued        500 mg 100 mL/hr over 60 Minutes Intravenous  Once 05/28/23 1649 05/28/23 1654   05/28/23 1330  vancomycin (VANCOREADY) IVPB 2000 mg/400 mL        2,000 mg 200 mL/hr over 120 Minutes Intravenous  Once 05/28/23 1324 05/28/23 1642   05/28/23 1330  ceFEPIme (MAXIPIME) 2 g in sodium chloride 0.9 % 100 mL IVPB        2 g 200 mL/hr over 30 Minutes Intravenous  Once 05/28/23 1324 05/28/23 1426       Subjective: Patient seen and examined at bedside today.  During my evaluation, he was comfortable lying in bed.  On 2 L of oxygen per minute.  Does not look in any kind of distress.  When asked, he says everything is bothering him.  When asked where the pain, he said the pain is everywhere.  He complains of bilateral chest discomfort but he looks comfortable.  Objective: Vitals:   05/29/23 0630 05/29/23 0631 05/29/23 0632 05/29/23 0757  BP:    (!) 168/63  Pulse: 86 87 87 82  Resp:    18  Temp:    99.6 F (37.6 C)  TempSrc:    Oral  SpO2: 100% 99% 99% 98%    Intake/Output Summary (Last 24 hours) at 05/29/2023 0829 Last data filed at 05/29/2023 0515 Gross per 24 hour  Intake 700 ml  Output 3000 ml  Net -2300 ml   There were no vitals filed for this visit.  Examination:  General exam: Overall comfortable, not in distress,obese HEENT: PERRL Respiratory system: Diminished sounds bilaterally, mild basilar crackles Cardiovascular system: S1 & S2 heard, RRR.  Gastrointestinal system: Abdomen is nondistended, soft and nontender. Central nervous system: Alert and oriented Extremities:   no clubbing ,no cyanosis, AV fistula on the left arm, bilateral BKA Skin: No rashes, no icterus  .  Superficial noninfected ulcer on the right BKA stump   Data Reviewed: I have personally reviewed following labs and imaging studies  CBC: Recent Labs  Lab 05/28/23 1243 05/28/23 1317 05/29/23 0419  WBC 23.5*  --  15.3*  NEUTROABS 21.5*  --   --   HGB 10.3* 10.9*  10.9* 10.2*  HCT 32.0* 32.0*  32.0* 32.3*  MCV 99.7  --  99.4  PLT 196  --  171   Basic Metabolic Panel: Recent Labs  Lab 05/28/23 1243 05/28/23 1317 05/29/23 0419  NA 135 134*  134* 132*  K 6.7* 6.5*  6.4* 4.5  CL 97* 99 97*  CO2 25  --  22  GLUCOSE 159* 156* 117*  BUN 48* 43* 27*  CREATININE 7.32* 7.90* 4.18*  CALCIUM 8.2*  --  8.0*     Recent Results (from the past 240 hour(s))  Resp panel by RT-PCR (  RSV, Flu A&B, Covid) Anterior Nasal Swab     Status: None   Collection Time: 05/28/23 12:43 PM   Specimen: Anterior Nasal Swab  Result Value Ref Range Status   SARS Coronavirus 2 by RT PCR NEGATIVE NEGATIVE Final   Influenza A by PCR NEGATIVE NEGATIVE Final   Influenza B by PCR NEGATIVE NEGATIVE Final    Comment: (NOTE) The Xpert Xpress SARS-CoV-2/FLU/RSV plus assay is intended as an aid in the diagnosis of influenza from Nasopharyngeal swab specimens and should not be used as a sole basis for treatment. Nasal washings and aspirates are unacceptable for Xpert Xpress SARS-CoV-2/FLU/RSV testing.  Fact Sheet for Patients: BloggerCourse.com  Fact Sheet for Healthcare Providers: SeriousBroker.it  This test is not yet approved or cleared by the [place] FDA and has been authorized for detection and/or diagnosis of SARS-CoV-2 by FDA under an Emergency Use Authorization (EUA). This EUA will remain in effect (meaning this test can be used) for the duration of the COVID-19 declaration under Section 564(b)(1) of the Act, 21 U.S.C. section  360bbb-3(b)(1), unless the authorization is terminated or revoked.     Resp Syncytial Virus by PCR NEGATIVE NEGATIVE Final    Comment: (NOTE) Fact Sheet for Patients: BloggerCourse.com  Fact Sheet for Healthcare Providers: SeriousBroker.it  This test is not yet approved or cleared by the [place] FDA and has been authorized for detection and/or diagnosis of SARS-CoV-2 by FDA under an Emergency Use Authorization (EUA). This EUA will remain in effect (meaning this test can be used) for the duration of the COVID-19 declaration under Section 564(b)(1) of the Act, 21 U.S.C. section 360bbb-3(b)(1), unless the authorization is terminated or revoked.  Performed at [facility], [address], [place], [place] [address]      Radiology Studies: CT CHEST ABDOMEN PELVIS WO CONTRAST  Result Date: 05/28/2023 CLINICAL DATA:  Abdominal pain. Decreased responsiveness. Fever. Dialysis patient. EXAM: CT CHEST, ABDOMEN AND PELVIS WITHOUT CONTRAST TECHNIQUE: Multidetector CT imaging of the chest, abdomen and pelvis was performed following the standard protocol without IV contrast. RADIATION DOSE REDUCTION: This exam was performed according to the departmental dose-optimization program which includes automated exposure control, adjustment of the mA and/or kV according to patient size and/or use of iterative reconstruction technique. COMPARISON:  Abdominopelvic CT 07/03/2022 FINDINGS: CT CHEST FINDINGS Cardiovascular: Borderline cardiomegaly. There are coronary artery calcifications. No pericardial effusion. Moderate aortic atherosclerosis. No aortic aneurysm. Mediastinum/Nodes: Scattered small mediastinal lymph nodes, not enlarged by size criteria. Assessment for hilar adenopathy is limited on this unenhanced exam. There is no esophageal wall thickening. Lungs/Pleura: Dependent atelectasis in the lung bases and perifissural upper lobes. Minimal  retained mucus and debris within the distal trachea and central airways. Trace pleural thickening without significant effusion. Musculoskeletal: Thoracic spondylosis with spurring. Remote posterior left rib fractures. There are no acute or suspicious osseous abnormalities. No obvious musculoskeletal infection on this unenhanced exam. There is mild generalized subcutaneous edema. CT ABDOMEN PELVIS FINDINGS Hepatobiliary: Cholecystectomy. No definite biliary dilatation, mild motion artifact. Unremarkable unenhanced appearance of the liver. Pancreas: Parenchymal atrophy. No ductal dilatation or inflammation. The known cystic structure in the pancreatic body measuring 13 mm is not well-defined on the current exam. This is grossly stable. Spleen: Enlarged spanning 15 cm cranial caudal. Adrenals/Urinary Tract: No adrenal nodule. Bilateral renal parenchymal atrophy. There is perinephric stranding. No hydronephrosis. No definite intrarenal calculi. Both ureters are decompressed. Urinary bladder is only minimally distended. Stomach/Bowel: The stomach is decompressed. There is a small duodenal diverticulum.  No bowel obstruction or inflammation. Colonic diverticulosis without diverticulitis. Moderate colonic stool burden. The appendix is not definitively seen. Vascular/Lymphatic: Advanced aortic and branch atherosclerosis. There multiple small retroperitoneal and periportal nodes, not enlarged by size criteria. There is an enlarged right external iliac node at 14 mm. This is slightly enlarged from prior exam. Reproductive: Prostate is unremarkable. Other: No free air or ascites. Postsurgical change of the anterior abdominal wall. Small fat containing supraumbilical ventral abdominal wall hernia. Suspect scarring in the right lower abdomen. There is mild subcutaneous edema of the right anterior upper thigh, series 3, image 25. No soft tissue gas. No focal fluid collection. Musculoskeletal: L4-S1 lumbar fusion hardware.  Degenerative change in the upper lumbar spine. No acute osseous findings. No findings to suggest musculoskeletal infection on this unenhanced exam. IMPRESSION: 1. Mild subcutaneous edema of the right anterior upper thigh, can be seen with cellulitis. No soft tissue gas or focal fluid collection. 2. No other acute findings or explanation for fever. 3. Minimal retained mucus and debris within the distal trachea and central airways, can be seen with bronchitis or aspiration. Minor dependent atelectasis in both lungs. 4. Mildly enlarged right external iliac node at 14 mm, slightly enlarged from prior exam is nonspecific, likely reactive. 5. Colonic diverticulosis without diverticulitis. 6. Splenomegaly. 7. The known cystic structure in the pancreatic body measuring 13 mm is not well-defined on the current exam, however grossly stable from prior imaging. Given stability over multiple exams, no specific imaging follow-up is needed. Aortic Atherosclerosis (ICD10-I70.0). Electronically Signed   By: Narda Rutherford M.D.   On: 05/28/2023 18:11   CT Head Wo Contrast  Result Date: 05/28/2023 CLINICAL DATA:  Altered mental status EXAM: CT HEAD WITHOUT CONTRAST TECHNIQUE: Contiguous axial images were obtained from the base of the skull through the vertex without intravenous contrast. RADIATION DOSE REDUCTION: This exam was performed according to the departmental dose-optimization program which includes automated exposure control, adjustment of the mA and/or kV according to patient size and/or use of iterative reconstruction technique. COMPARISON:  04/02/2023 FINDINGS: Brain: Periventricular white matter and corona radiata hypodensities favor chronic ischemic microvascular white matter disease. Suspected remote prior strokes in the left frontal lobe and right parietal vertex, appearance not changed from 04/02/2023. Otherwise, the brainstem, cerebellum, cerebral peduncles, thalamus, basal ganglia, basilar cisterns, and  ventricular system appear within normal limits. No intracranial hemorrhage, mass lesion, or acute CVA. Vascular: There is atherosclerotic calcification of the cavernous carotid arteries bilaterally. Skull: Unremarkable Sinuses/Orbits: Unremarkable Other: No supplemental non-categorized findings. IMPRESSION: 1. No acute intracranial findings. 2. Periventricular white matter and corona radiata hypodensities favor chronic ischemic microvascular white matter disease. 3. Suspected remote prior strokes in the left frontal lobe and right parietal vertex. 4. Atherosclerosis. Electronically Signed   By: Gaylyn Rong M.D.   On: 05/28/2023 18:03   DG Chest Port 1 View  Result Date: 05/28/2023 CLINICAL DATA:  Fever, altered level of consciousness EXAM: PORTABLE CHEST 1 VIEW COMPARISON:  04/02/2023 FINDINGS: Single frontal view of the chest demonstrates an unremarkable cardiac silhouette. There is patchy bibasilar airspace disease, right greater than left. No effusion or pneumothorax. No acute bony abnormalities. IMPRESSION: 1. Patchy bibasilar airspace disease greatest at the right lung base, which could reflect pneumonia given clinical presentation. Electronically Signed   By: Sharlet Salina M.D.   On: 05/28/2023 14:55    Scheduled Meds:  Chlorhexidine Gluconate Cloth  6 each Topical Q0600   insulin aspart  0-6 Units Subcutaneous TID WC   nicotine  21 mg Transdermal QHS   sodium chloride flush  3 mL Intravenous Q12H   Continuous Infusions:  sodium chloride     metronidazole Stopped (05/29/23 0515)     LOS: 1 day   Burnadette Pop, MD [facility] P9/18/2024, 8:29 AM

## 2023-05-29 NOTE — ED Notes (Signed)
Got patient on the monitor patient is resting with call bell in reach  ?

## 2023-05-30 DIAGNOSIS — J9601 Acute respiratory failure with hypoxia: Secondary | ICD-10-CM | POA: Diagnosis not present

## 2023-05-30 DIAGNOSIS — N186 End stage renal disease: Secondary | ICD-10-CM

## 2023-05-30 DIAGNOSIS — I482 Chronic atrial fibrillation, unspecified: Secondary | ICD-10-CM | POA: Diagnosis not present

## 2023-05-30 DIAGNOSIS — Z992 Dependence on renal dialysis: Secondary | ICD-10-CM

## 2023-05-30 DIAGNOSIS — A419 Sepsis, unspecified organism: Secondary | ICD-10-CM | POA: Diagnosis not present

## 2023-05-30 LAB — LEGIONELLA PNEUMOPHILA SEROGP 1 UR AG: L. pneumophila Serogp 1 Ur Ag: NEGATIVE

## 2023-05-30 LAB — URINE CULTURE: Culture: NO GROWTH

## 2023-05-30 LAB — CBC
HCT: 27.5 % — ABNORMAL LOW (ref 39.0–52.0)
Hemoglobin: 9 g/dL — ABNORMAL LOW (ref 13.0–17.0)
MCH: 31.7 pg (ref 26.0–34.0)
MCHC: 32.7 g/dL (ref 30.0–36.0)
MCV: 96.8 fL (ref 80.0–100.0)
Platelets: 185 10*3/uL (ref 150–400)
RBC: 2.84 MIL/uL — ABNORMAL LOW (ref 4.22–5.81)
RDW: 13.9 % (ref 11.5–15.5)
WBC: 9.7 10*3/uL (ref 4.0–10.5)
nRBC: 0 % (ref 0.0–0.2)

## 2023-05-30 LAB — BASIC METABOLIC PANEL
Anion gap: 12 (ref 5–15)
BUN: 46 mg/dL — ABNORMAL HIGH (ref 8–23)
CO2: 25 mmol/L (ref 22–32)
Calcium: 7.7 mg/dL — ABNORMAL LOW (ref 8.9–10.3)
Chloride: 95 mmol/L — ABNORMAL LOW (ref 98–111)
Creatinine, Ser: 5.42 mg/dL — ABNORMAL HIGH (ref 0.61–1.24)
GFR, Estimated: 11 mL/min — ABNORMAL LOW (ref >60)
Glucose, Bld: 152 mg/dL — ABNORMAL HIGH (ref 70–99)
Potassium: 4.1 mmol/L (ref 3.5–5.1)
Sodium: 132 mmol/L — ABNORMAL LOW (ref 135–145)

## 2023-05-30 LAB — HEPATITIS B SURFACE ANTIBODY, QUANTITATIVE: Hep B S AB Quant (Post): 15.9 m[IU]/mL

## 2023-05-30 LAB — GLUCOSE, CAPILLARY
Glucose-Capillary: 159 mg/dL — ABNORMAL HIGH (ref 70–99)
Glucose-Capillary: 200 mg/dL — ABNORMAL HIGH (ref 70–99)
Glucose-Capillary: 150 mg/dL — ABNORMAL HIGH (ref 70–99)
Glucose-Capillary: 183 mg/dL — ABNORMAL HIGH (ref 70–99)

## 2023-05-30 MED ORDER — DIPHENHYDRAMINE HCL 25 MG PO CAPS
50.0000 mg | ORAL_CAPSULE | Freq: Once | ORAL | Status: AC
  Administered 2023-05-30: 50 mg via ORAL
  Filled 2023-05-30: qty 2

## 2023-05-30 NOTE — Procedures (Signed)
I was present at this dialysis session, have reviewed the session and made  appropriate changes Vinson Moselle MD  [facility] 05/30/2023, 1:10 PM

## 2023-05-30 NOTE — Progress Notes (Signed)
PROGRESS NOTE        PATIENT DETAILS Name: [name] Age: 67 y.o. Sex: male Date of Birth: 01/19/1956 Admit Date: 05/28/2023 Admitting Physician Orland Mustard, MD ZOX:[id number], Harrold Donath, PA-C  Brief Summary: Patient is a 67 y.o.  male with history of ESRD on HD, PAD-s/p bilateral BKA, PAF on Eliquis, CAD, HTN, HLD, DM-2-who presented from his nursing facility with confusion/weakness-he was found to have severe sepsis-suspicion for aspiration pneumonia and subsequently admitted to the hospitalist service.  See below for further details  Significant events: 9/17>> admit to [facility]  Significant studies: 9/17>> CT chest/abdomen/pelvis: Retained mucus/debris in the distal trachea and central airways-dependent atelectasis both bases 9/17>> CT head: No acute findings.  Significant microbiology data: 9/17>> COVID/influenza/RSV PCR: Negative 9/17>> urine culture: No growth 9/17>> blood culture: No growth  Procedures: None  Consults: Nephrology  Subjective: Feels-afebrile overnight-no cough.  Completely awake and alert this morning.  Objective: Vitals: Blood pressure (!) 157/48, pulse 68, temperature 98.8 F (37.1 C), resp. rate 15, weight (S) 113.5 kg, SpO2 98%.   Exam: Gen Exam:Alert awake-not in any distress HEENT:atraumatic, normocephalic Chest: B/L clear to auscultation anteriorly CVS:S1S2 regular Abdomen:soft non tender, non distended Extremities: Bilateral BKA-small abrasion/superficial ulcer in right BKA area. Neurology: Non focal Skin: no rash  Pertinent Labs/Radiology:    Latest Ref Rng & Units 05/30/2023    6:21 AM 05/29/2023    4:19 AM 05/28/2023    1:17 PM  CBC  WBC 4.0 - 10.5 K/uL 9.7  15.3    Hemoglobin 13.0 - 17.0 g/dL 9.0  40.9  81.1    91.4   Hematocrit 39.0 - 52.0 % 27.5  32.3  32.0    32.0   Platelets 150 - 400 K/uL 185  171      Lab Results  Component Value Date   NA 132 (L) 05/30/2023   K 4.1 05/30/2023   CL 95 (L)  05/30/2023   CO2 25 05/30/2023      Assessment/Plan: Severe sepsis likely secondary to aspiration pneumonia Acute hypoxic respiratory failure due to aspiration pneumonia Overall improved-sepsis physiology as resolved  Remains on just 2 L of oxygen overnight  Continue Rocephin/doxycycline SLP eval to ensure no major issues with swallowing given suspicion for aspiration pneumonia  Acute metabolic encephalopathy Likely secondary to above Improved-completely awake and alert CT imaging negative Awaiting MRI brain  Hyponatremia Due to fluid overload Mild-asymptomatic Should improve with further HD sessions  ESRD on HD TTS Nephrology following and directing care  Normocytic anemia Secondary to CKD Iron/iron deferred to nephrology service  Minimally elevated troponin Likely demand ischemia-also ESRD patient He does not have any chest pain or shortness of breath. ED physician had discussed with cardiologist-Dr. Ganji-recommendations were for just supportive care Recent echo in July 2024 with stable EF.  HTN BP slowly creeping up Amlodipine/hydralazine/Imdur/labetalol remain on hold-slowly resume over the next several days.  Chronic HFpEF Euvolemic Volume removal with HD  PAF Sinus rhythm Telemetry monitoring Amiodarone/Eliquis   CAD s/p PCI 2016 Continue statin Suspect not on ASA as on Eliquis Resume beta-blocker over the next several days  PAD s/p bilateral BKA Right carotid endarterectomy 2012 Eliquis/Lipitor  HLD Statin  GERD PPI  Chronic pain syndrome Neurontin Transdermal fentanyl/Percocet Appears stable  PAD s/p bilateral BKA Eliquis/Lipitor  OSA Intolerant to CPAP prior notes  Cystic structure in pancreatic body  13 mm Grossly stable per radiology from prior exam No specific follow-up imaging recommended by radiology  Obesity: Estimated body mass index is 32.13 kg/m as calculated from the following:   Height as of 04/24/23: 6\' 2"  (1.88  m).   Weight as of this encounter: 113.5 kg.   Code status:   Code Status: Full Code   DVT Prophylaxis: apixaban (ELIQUIS) tablet 5 mg     Family Communication: None at bedside   Disposition Plan: Status is: Inpatient Remains inpatient appropriate because: Severity of illness   Planned Discharge Destination:Skilled nursing facility   Diet: Diet Order             Diet Carb Modified Fluid consistency: Thin; Room service appropriate? Yes  Diet effective now                     Antimicrobial agents: Anti-infectives (From admission, onward)    Start     Dose/Rate Route Frequency Ordered Stop   05/29/23 1000  cefTRIAXone (ROCEPHIN) 2 g in sodium chloride 0.9 % 100 mL IVPB        2 g 200 mL/hr over 30 Minutes Intravenous Daily 05/29/23 0934     05/29/23 1000  doxycycline (VIBRA-TABS) tablet 100 mg        100 mg Oral Every 12 hours 05/29/23 0934     05/28/23 1700  metroNIDAZOLE (FLAGYL) IVPB 500 mg  Status:  Discontinued        500 mg 100 mL/hr over 60 Minutes Intravenous Every 12 hours 05/28/23 1649 05/29/23 1524   05/28/23 1700  metroNIDAZOLE (FLAGYL) IVPB 500 mg  Status:  Discontinued        500 mg 100 mL/hr over 60 Minutes Intravenous  Once 05/28/23 1649 05/28/23 1654   05/28/23 1330  vancomycin (VANCOREADY) IVPB 2000 mg/400 mL        2,000 mg 200 mL/hr over 120 Minutes Intravenous  Once 05/28/23 1324 05/28/23 1642   05/28/23 1330  ceFEPIme (MAXIPIME) 2 g in sodium chloride 0.9 % 100 mL IVPB        2 g 200 mL/hr over 30 Minutes Intravenous  Once 05/28/23 1324 05/28/23 1426        MEDICATIONS: Scheduled Meds:  amiodarone  200 mg Oral Daily   apixaban  5 mg Oral BID   atorvastatin  40 mg Oral QHS   Chlorhexidine Gluconate Cloth  6 each Topical Q0600   Chlorhexidine Gluconate Cloth  6 each Topical Q0600   docusate sodium  100 mg Oral BID   doxercalciferol  1 mcg Intravenous Q T,Th,Sa-HD   doxycycline  100 mg Oral Q12H   famotidine  20 mg Oral QODAY    fentaNYL  1 patch Transdermal Q72H   folic acid  1 mg Oral Daily   gabapentin  100 mg Oral TID   icosapent Ethyl  1 g Oral QODAY   insulin aspart  0-6 Units Subcutaneous TID WC   loratadine  10 mg Oral Daily   metoCLOPramide  5 mg Oral TID AC & HS   multivitamin  1 tablet Oral QHS   nicotine  21 mg Transdermal QHS   rOPINIRole  2 mg Oral QHS   senna-docusate  2 tablet Oral QHS   sodium chloride flush  3 mL Intravenous Q12H   tamsulosin  0.4 mg Oral Daily   Continuous Infusions:  sodium chloride     cefTRIAXone (ROCEPHIN)  IV Stopped (05/29/23 1050)   PRN Meds:.sodium chloride, acetaminophen **OR**  acetaminophen, midodrine, ondansetron **OR** ondansetron (ZOFRAN) IV, oxyCODONE-acetaminophen, sodium chloride flush   I have personally reviewed following labs and imaging studies  LABORATORY DATA: CBC: Recent Labs  Lab 05/28/23 1243 05/28/23 1317 05/29/23 0419 05/30/23 0621  WBC 23.5*  --  15.3* 9.7  NEUTROABS 21.5*  --   --   --   HGB 10.3* 10.9*  10.9* 10.2* 9.0*  HCT 32.0* 32.0*  32.0* 32.3* 27.5*  MCV 99.7  --  99.4 96.8  PLT 196  --  171 185    Basic Metabolic Panel: Recent Labs  Lab 05/28/23 1243 05/28/23 1317 05/29/23 0419 05/30/23 0621  NA 135 134*  134* 132* 132*  K 6.7* 6.5*  6.4* 4.5 4.1  CL 97* 99 97* 95*  CO2 25  --  22 25  GLUCOSE 159* 156* 117* 152*  BUN 48* 43* 27* 46*  CREATININE 7.32* 7.90* 4.18* 5.42*  CALCIUM 8.2*  --  8.0* 7.7*    GFR: Estimated Creatinine Clearance: 17.5 mL/min (A) (by C-G formula based on SCr of 5.42 mg/dL (H)).  Liver Function Tests: Recent Labs  Lab 05/28/23 1243  AST 18  ALT 18  ALKPHOS 97  BILITOT 0.7  PROT 6.1*  ALBUMIN 2.7*   Recent Labs  Lab 05/28/23 1243  LIPASE 19   Recent Labs  Lab 05/28/23 1748  AMMONIA 20    Coagulation Profile: No results for input(s): "INR", "PROTIME" in the last 168 hours.  Cardiac Enzymes: No results for input(s): "CKTOTAL", "CKMB", "CKMBINDEX", "TROPONINI" in  the last 168 hours.  BNP (last 3 results) No results for input(s): "PROBNP" in the last 8760 hours.  Lipid Profile: No results for input(s): "CHOL", "HDL", "LDLCALC", "TRIG", "CHOLHDL", "LDLDIRECT" in the last 72 hours.  Thyroid Function Tests: Recent Labs    05/28/23 1748  TSH 0.771    Anemia Panel: Recent Labs    05/28/23 1748  VITAMINB12 411    Urine analysis:    Component Value Date/Time   COLORURINE YELLOW 05/28/2023 1427   APPEARANCEUR CLEAR 05/28/2023 1427   LABSPEC 1.014 05/28/2023 1427   PHURINE 8.0 05/28/2023 1427   GLUCOSEU 150 (A) 05/28/2023 1427   HGBUR NEGATIVE 05/28/2023 1427   BILIRUBINUR NEGATIVE 05/28/2023 1427   KETONESUR NEGATIVE 05/28/2023 1427   PROTEINUR >=300 (A) 05/28/2023 1427   UROBILINOGEN 0.2 01/14/2015 0346   NITRITE NEGATIVE 05/28/2023 1427   LEUKOCYTESUR TRACE (A) 05/28/2023 1427    Sepsis Labs: Lactic Acid, Venous    Component Value Date/Time   LATICACIDVEN 2.1 (HH) 05/28/2023 1418    MICROBIOLOGY: Recent Results (from the past 240 hour(s))  Resp panel by RT-PCR (RSV, Flu A&B, Covid) Anterior Nasal Swab     Status: None   Collection Time: 05/28/23 12:43 PM   Specimen: Anterior Nasal Swab  Result Value Ref Range Status   SARS Coronavirus 2 by RT PCR NEGATIVE NEGATIVE Final   Influenza A by PCR NEGATIVE NEGATIVE Final   Influenza B by PCR NEGATIVE NEGATIVE Final    Comment: (NOTE) The Xpert Xpress SARS-CoV-2/FLU/RSV plus assay is intended as an aid in the diagnosis of influenza from Nasopharyngeal swab specimens and should not be used as a sole basis for treatment. Nasal washings and aspirates are unacceptable for Xpert Xpress SARS-CoV-2/FLU/RSV testing.  Fact Sheet for Patients: BloggerCourse.com  Fact Sheet for Healthcare Providers: SeriousBroker.it  This test is not yet approved or cleared by the [place] FDA and has been authorized for detection and/or  diagnosis of SARS-CoV-2 by FDA  under an Emergency Use Authorization (EUA). This EUA will remain in effect (meaning this test can be used) for the duration of the COVID-19 declaration under Section 564(b)(1) of the Act, 21 U.S.C. section 360bbb-3(b)(1), unless the authorization is terminated or revoked.     Resp Syncytial Virus by PCR NEGATIVE NEGATIVE Final    Comment: (NOTE) Fact Sheet for Patients: BloggerCourse.com  Fact Sheet for Healthcare Providers: SeriousBroker.it  This test is not yet approved or cleared by the [place] FDA and has been authorized for detection and/or diagnosis of SARS-CoV-2 by FDA under an Emergency Use Authorization (EUA). This EUA will remain in effect (meaning this test can be used) for the duration of the COVID-19 declaration under Section 564(b)(1) of the Act, 21 U.S.C. section 360bbb-3(b)(1), unless the authorization is terminated or revoked.  Performed at [facility], [address], [place], [place] [address]   Urine Culture     Status: None   Collection Time: 05/28/23 12:43 PM   Specimen: Urine, Catheterized  Result Value Ref Range Status   Specimen Description URINE, CATHETERIZED  Final   Special Requests NONE  Final   Culture   Final    NO GROWTH Performed at [facility], [address] [place], [place] [address]    Report Status 05/30/2023 FINAL  Final  Culture, blood (routine x 2)     Status: None (Preliminary result)   Collection Time: 05/28/23  1:00 PM   Specimen: BLOOD  Result Value Ref Range Status   Specimen Description BLOOD SITE NOT SPECIFIED  Final   Special Requests   Final    BOTTLES DRAWN AEROBIC AND ANAEROBIC Blood Culture adequate volume   Culture   Final    NO GROWTH 2 DAYS Performed at [facility], [address], [place], [place] [address]    Report Status PENDING  Incomplete  Culture, blood (routine x 2)     Status: None (Preliminary  result)   Collection Time: 05/28/23  1:53 PM   Specimen: BLOOD RIGHT ARM  Result Value Ref Range Status   Specimen Description BLOOD RIGHT ARM  Final   Special Requests   Final    BOTTLES DRAWN AEROBIC ONLY Blood Culture adequate volume   Culture   Final    NO GROWTH 2 DAYS Performed at [facility], [address], [place], [place] [address]    Report Status PENDING  Incomplete    RADIOLOGY STUDIES/RESULTS: CT CHEST ABDOMEN PELVIS WO CONTRAST  Result Date: 05/28/2023 CLINICAL DATA:  Abdominal pain. Decreased responsiveness. Fever. Dialysis patient. EXAM: CT CHEST, ABDOMEN AND PELVIS WITHOUT CONTRAST TECHNIQUE: Multidetector CT imaging of the chest, abdomen and pelvis was performed following the standard protocol without IV contrast. RADIATION DOSE REDUCTION: This exam was performed according to the departmental dose-optimization program which includes automated exposure control, adjustment of the mA and/or kV according to patient size and/or use of iterative reconstruction technique. COMPARISON:  Abdominopelvic CT 07/03/2022 FINDINGS: CT CHEST FINDINGS Cardiovascular: Borderline cardiomegaly. There are coronary artery calcifications. No pericardial effusion. Moderate aortic atherosclerosis. No aortic aneurysm. Mediastinum/Nodes: Scattered small mediastinal lymph nodes, not enlarged by size criteria. Assessment for hilar adenopathy is limited on this unenhanced exam. There is no esophageal wall thickening. Lungs/Pleura: Dependent atelectasis in the lung bases and perifissural upper lobes. Minimal retained mucus and debris within the distal trachea and central airways. Trace pleural thickening without significant effusion. Musculoskeletal: Thoracic spondylosis with spurring. Remote posterior left rib fractures. There are  no acute or suspicious osseous abnormalities. No obvious musculoskeletal infection on this unenhanced exam. There is mild generalized subcutaneous edema. CT ABDOMEN PELVIS  FINDINGS Hepatobiliary: Cholecystectomy. No definite biliary dilatation, mild motion artifact. Unremarkable unenhanced appearance of the liver. Pancreas: Parenchymal atrophy. No ductal dilatation or inflammation. The known cystic structure in the pancreatic body measuring 13 mm is not well-defined on the current exam. This is grossly stable. Spleen: Enlarged spanning 15 cm cranial caudal. Adrenals/Urinary Tract: No adrenal nodule. Bilateral renal parenchymal atrophy. There is perinephric stranding. No hydronephrosis. No definite intrarenal calculi. Both ureters are decompressed. Urinary bladder is only minimally distended. Stomach/Bowel: The stomach is decompressed. There is a small duodenal diverticulum. No bowel obstruction or inflammation. Colonic diverticulosis without diverticulitis. Moderate colonic stool burden. The appendix is not definitively seen. Vascular/Lymphatic: Advanced aortic and branch atherosclerosis. There multiple small retroperitoneal and periportal nodes, not enlarged by size criteria. There is an enlarged right external iliac node at 14 mm. This is slightly enlarged from prior exam. Reproductive: Prostate is unremarkable. Other: No free air or ascites. Postsurgical change of the anterior abdominal wall. Small fat containing supraumbilical ventral abdominal wall hernia. Suspect scarring in the right lower abdomen. There is mild subcutaneous edema of the right anterior upper thigh, series 3, image 25. No soft tissue gas. No focal fluid collection. Musculoskeletal: L4-S1 lumbar fusion hardware. Degenerative change in the upper lumbar spine. No acute osseous findings. No findings to suggest musculoskeletal infection on this unenhanced exam. IMPRESSION: 1. Mild subcutaneous edema of the right anterior upper thigh, can be seen with cellulitis. No soft tissue gas or focal fluid collection. 2. No other acute findings or explanation for fever. 3. Minimal retained mucus and debris within the distal  trachea and central airways, can be seen with bronchitis or aspiration. Minor dependent atelectasis in both lungs. 4. Mildly enlarged right external iliac node at 14 mm, slightly enlarged from prior exam is nonspecific, likely reactive. 5. Colonic diverticulosis without diverticulitis. 6. Splenomegaly. 7. The known cystic structure in the pancreatic body measuring 13 mm is not well-defined on the current exam, however grossly stable from prior imaging. Given stability over multiple exams, no specific imaging follow-up is needed. Aortic Atherosclerosis (ICD10-I70.0). Electronically Signed   By: Narda Rutherford M.D.   On: 05/28/2023 18:11   CT Head Wo Contrast  Result Date: 05/28/2023 CLINICAL DATA:  Altered mental status EXAM: CT HEAD WITHOUT CONTRAST TECHNIQUE: Contiguous axial images were obtained from the base of the skull through the vertex without intravenous contrast. RADIATION DOSE REDUCTION: This exam was performed according to the departmental dose-optimization program which includes automated exposure control, adjustment of the mA and/or kV according to patient size and/or use of iterative reconstruction technique. COMPARISON:  04/02/2023 FINDINGS: Brain: Periventricular white matter and corona radiata hypodensities favor chronic ischemic microvascular white matter disease. Suspected remote prior strokes in the left frontal lobe and right parietal vertex, appearance not changed from 04/02/2023. Otherwise, the brainstem, cerebellum, cerebral peduncles, thalamus, basal ganglia, basilar cisterns, and ventricular system appear within normal limits. No intracranial hemorrhage, mass lesion, or acute CVA. Vascular: There is atherosclerotic calcification of the cavernous carotid arteries bilaterally. Skull: Unremarkable Sinuses/Orbits: Unremarkable Other: No supplemental non-categorized findings. IMPRESSION: 1. No acute intracranial findings. 2. Periventricular white matter and corona radiata hypodensities  favor chronic ischemic microvascular white matter disease. 3. Suspected remote prior strokes in the left frontal lobe and right parietal vertex. 4. Atherosclerosis. Electronically Signed   By: Gaylyn Rong M.D.   On: 05/28/2023 18:03  DG Chest Port 1 View  Result Date: 05/28/2023 CLINICAL DATA:  Fever, altered level of consciousness EXAM: PORTABLE CHEST 1 VIEW COMPARISON:  04/02/2023 FINDINGS: Single frontal view of the chest demonstrates an unremarkable cardiac silhouette. There is patchy bibasilar airspace disease, right greater than left. No effusion or pneumothorax. No acute bony abnormalities. IMPRESSION: 1. Patchy bibasilar airspace disease greatest at the right lung base, which could reflect pneumonia given clinical presentation. Electronically Signed   By: Sharlet Salina M.D.   On: 05/28/2023 14:55     LOS: 2 days   Jeoffrey Massed, MD  [facility]    To contact the attending provider between 7A-7P or the covering provider during after hours 7P-7A, please log into the web site [email or web address] and access using universal [place] password for that web site. If you do not have the password, please call the hospital operator.  05/30/2023, 8:51 AM

## 2023-05-30 NOTE — Progress Notes (Signed)
SLP Cancellation Note  Patient Details Name: [name] MRN: [id number] DOB: 1955-11-14   Cancelled treatment:       Reason Eval/Treat Not Completed: Patient at procedure or test/unavailable. Will continue efforts    Royce Macadamia 05/30/2023, 12:58 PM

## 2023-05-30 NOTE — Progress Notes (Signed)
[facility] Progress Note  Subjective: seen in HD. Had 3 L uf w/ HD yesterday. Feeling a bit better.   Vitals:   05/30/23 1107 05/30/23 1130 05/30/23 1207 05/30/23 1229  BP: 125/60 (!) 147/36 (!) 159/44 (!) 174/52  Pulse: 74 74 74 75  Resp: 17 (!) 21 18 16   Temp:      TempSrc:      SpO2: 95% 97% 93% 99%  Weight:        Exam: Gen no distress, more aler No jvd or bruits Chest clear bilat to bases RRR no MRG Abd soft ntnd no mass or ascites +bs MS bilat BKA Ext 1-2+ bilat LE > UE edema Neuro more alert today, nf    L AVF+bruit    Renal-related home meds: - norvasc 10mg  daily - gabapentin 100 tid - hydralazine 50 tid - imdur 60 daily - labetalol 200 bid - midodrine 10mg  daily prn sbp <100    OP HD: [facility]   4h  450 /800  107.9kg  2/2 bath  L AVF  Heparin none - last OP HD 9/14, post wt 115.7kg - venofer 100 mg three times per week thru 10/05 - hectorol 1 mcg IV three times per week - mircera 75 mcg q 2 wks, last 9/14, due 9/28 - came off 3-7kg over most of the last 3 wks      CXR 9/17 - IMPRESSION: Patchy bibasilar airspace disease greatest at the right lung base, which could reflect pneumonia given clinical presentation     Assessment/ Plan: Sepsis - w/ fevers, Reina Fuse, AMS. Cx's sent and IV abx started per pmd. CXR w/ poss PNA. Per pmd.  Hyperkalemia - resolved  Acute hypoxic resp failure/ vol overload/ poss pna - getting IV abx and O2 support. 3 L  off w/ HD 9/18. Max UF w/ HD again today.  ESRD - on HD TTS. Missed 1 HD last week. Had HD here Wed. On HD again today.  HTN - BP's low on admission, better/ wnl now. Home meds on hold.  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 Follow.  MBD ckd - CCa in range. Follow.  Chronic pain - on fentanyl patch and percocet at home.  AMS - multifactorial PAF - on eliquis and amiodarone      Vinson Moselle MD  [facility] 05/30/2023, 1:10 PM  Recent Labs  Lab 05/28/23 1243 05/28/23 1317 05/29/23 0419  05/30/23 0621  HGB 10.3*   < > 10.2* 9.0*  ALBUMIN 2.7*  --   --   --   CALCIUM 8.2*  --  8.0* 7.7*  CREATININE 7.32*   < > 4.18* 5.42*  K 6.7*   < > 4.5 4.1   < > = values in this interval not displayed.   No results for input(s): "IRON", "TIBC", "FERRITIN" in the last 168 hours. Inpatient medications:  amiodarone  200 mg Oral Daily   apixaban  5 mg Oral BID   atorvastatin  40 mg Oral QHS   Chlorhexidine Gluconate Cloth  6 each Topical Q0600   Chlorhexidine Gluconate Cloth  6 each Topical Q0600   docusate sodium  100 mg Oral BID   doxercalciferol  1 mcg Intravenous Q T,Th,Sa-HD   doxycycline  100 mg Oral Q12H   famotidine  20 mg Oral QODAY   fentaNYL  1 patch Transdermal Q72H   folic acid  1 mg Oral Daily   gabapentin  100 mg Oral TID   icosapent  Ethyl  1 g Oral QODAY   insulin aspart  0-6 Units Subcutaneous TID WC   loratadine  10 mg Oral Daily   metoCLOPramide  5 mg Oral TID AC & HS   multivitamin  1 tablet Oral QHS   nicotine  21 mg Transdermal QHS   rOPINIRole  2 mg Oral QHS   senna-docusate  2 tablet Oral QHS   sodium chloride flush  3 mL Intravenous Q12H   tamsulosin  0.4 mg Oral Daily    sodium chloride     cefTRIAXone (ROCEPHIN)  IV Stopped (05/29/23 1050)   sodium chloride, acetaminophen **OR** acetaminophen, midodrine, ondansetron **OR** ondansetron (ZOFRAN) IV, oxyCODONE-acetaminophen, sodium chloride flush

## 2023-05-30 NOTE — Progress Notes (Signed)
PT Cancellation Note  Patient Details Name: [name] MRN: [id number] DOB: Feb 08, 1956   Cancelled Treatment:    Reason Eval/Treat Not Completed: Patient at procedure or test/unavailable (HD)   [name] [name] 05/30/2023, 8:45 AM [name], PT Acute Rehabilitation Services Office: [phone]

## 2023-05-30 NOTE — Progress Notes (Signed)
   05/30/23 1331  Vitals  Temp 98.4 F (36.9 C)  Pulse Rate 80  Resp 18  BP (!) 169/48  SpO2 98 %  O2 Device Room Air  Oxygen Therapy  Pulse Oximetry Type Continuous  Post Treatment  Dialyzer Clearance Clear  Hemodialysis Intake (mL) 0 mL  Liters Processed 75.6  Fluid Removed (mL) 3000 mL  Tolerated HD Treatment Yes  Post-Hemodialysis Comments Tx. Completed without difficulties, pt. voice no complaints. Report given to 5W bedside RN Julieanne Cotton. Medication Admin Per order  AVG/AVF Arterial Site Held (minutes) 5 minutes  AVG/AVF Venous Site Held (minutes) 5 minutes   Received patient in bed to unit.  Alert and oriented.  Informed consent signed and in chart.   TX duration:3.25  Patient tolerated well.  Transported back to the room  Alert, without acute distress.  Hand-off given to patient's nurse.   Access used: Yes Access issues: No  Total UF removed: 3000 Medication(s) given: See MAR Post HD VS: See Above Grid Post HD weight: 110.4 kg   [facility]

## 2023-05-30 NOTE — Progress Notes (Signed)
OT Cancellation Note  Patient Details Name: [name] MRN: [id number] DOB: 1956-03-01   Cancelled Treatment:    Reason Eval/Treat Not Completed: Patient at procedure or test/ unavailable (HD)  Evern Bio 05/30/2023, 10:40 AM Berna Spare, OTR/L Acute Rehabilitation Services Office: [phone]

## 2023-05-31 DIAGNOSIS — E7800 Pure hypercholesterolemia, unspecified: Secondary | ICD-10-CM

## 2023-05-31 DIAGNOSIS — I10 Essential (primary) hypertension: Secondary | ICD-10-CM | POA: Diagnosis not present

## 2023-05-31 DIAGNOSIS — I5032 Chronic diastolic (congestive) heart failure: Secondary | ICD-10-CM

## 2023-05-31 DIAGNOSIS — I482 Chronic atrial fibrillation, unspecified: Secondary | ICD-10-CM | POA: Diagnosis not present

## 2023-05-31 DIAGNOSIS — A419 Sepsis, unspecified organism: Secondary | ICD-10-CM | POA: Diagnosis not present

## 2023-05-31 DIAGNOSIS — Z7401 Bed confinement status: Secondary | ICD-10-CM | POA: Diagnosis not present

## 2023-05-31 DIAGNOSIS — R6520 Severe sepsis without septic shock: Secondary | ICD-10-CM | POA: Diagnosis not present

## 2023-05-31 LAB — RENAL FUNCTION PANEL
Albumin: 2.6 g/dL — ABNORMAL LOW (ref 3.5–5.0)
Anion gap: 9 (ref 5–15)
BUN: 29 mg/dL — ABNORMAL HIGH (ref 8–23)
CO2: 26 mmol/L (ref 22–32)
Calcium: 8.1 mg/dL — ABNORMAL LOW (ref 8.9–10.3)
Chloride: 98 mmol/L (ref 98–111)
Creatinine, Ser: 4.19 mg/dL — ABNORMAL HIGH (ref 0.61–1.24)
GFR, Estimated: 15 mL/min — ABNORMAL LOW (ref >60)
Glucose, Bld: 154 mg/dL — ABNORMAL HIGH (ref 70–99)
Phosphorus: 3.5 mg/dL (ref 2.5–4.6)
Potassium: 3.8 mmol/L (ref 3.5–5.1)
Sodium: 133 mmol/L — ABNORMAL LOW (ref 135–145)

## 2023-05-31 LAB — CBC
HCT: 32.3 % — ABNORMAL LOW (ref 39.0–52.0)
Hemoglobin: 10.3 g/dL — ABNORMAL LOW (ref 13.0–17.0)
MCH: 30.4 pg (ref 26.0–34.0)
MCHC: 31.9 g/dL (ref 30.0–36.0)
MCV: 95.3 fL (ref 80.0–100.0)
Platelets: 215 10*3/uL (ref 150–400)
RBC: 3.39 MIL/uL — ABNORMAL LOW (ref 4.22–5.81)
RDW: 13.3 % (ref 11.5–15.5)
WBC: 7.5 10*3/uL (ref 4.0–10.5)
nRBC: 0 % (ref 0.0–0.2)

## 2023-05-31 LAB — GLUCOSE, CAPILLARY
Glucose-Capillary: 152 mg/dL — ABNORMAL HIGH (ref 70–99)
Glucose-Capillary: 148 mg/dL — ABNORMAL HIGH (ref 70–99)

## 2023-05-31 MED ORDER — OXYCODONE-ACETAMINOPHEN 5-325 MG PO TABS: 1.0000 | ORAL_TABLET | Freq: Three times a day (TID) | ORAL | 0 refills | Status: DC | PRN

## 2023-05-31 MED ORDER — HYDROMORPHONE HCL 1 MG/ML IJ SOLN
0.5000 mg | INTRAMUSCULAR | Status: AC
  Administered 2023-05-31: 0.5 mg via INTRAVENOUS
  Filled 2023-05-31: qty 0.5

## 2023-05-31 MED ORDER — METOCLOPRAMIDE HCL 5 MG PO TABS: 5.0000 mg | ORAL_TABLET | Freq: Three times a day (TID) | ORAL | Status: DC | PRN

## 2023-05-31 MED ORDER — FENTANYL 25 MCG/HR TD PT72: 1.0000 | MEDICATED_PATCH | TRANSDERMAL | 0 refills | Status: DC

## 2023-05-31 MED ORDER — ISOSORBIDE MONONITRATE ER 60 MG PO TB24
60.0000 mg | ORAL_TABLET | Freq: Every day | ORAL | Status: DC
  Filled 2023-05-31: qty 1

## 2023-05-31 MED ORDER — AMOXICILLIN-POT CLAVULANATE 500-125 MG PO TABS
1.0000 | ORAL_TABLET | Freq: Every day | ORAL | Status: AC
Start: 2023-06-01 — End: 2023-06-04

## 2023-05-31 MED ORDER — LABETALOL HCL 200 MG PO TABS
100.0000 mg | ORAL_TABLET | Freq: Two times a day (BID) | ORAL | Status: DC
  Administered 2023-05-31: 100 mg via ORAL
  Filled 2023-05-31: qty 1

## 2023-05-31 MED ORDER — AMLODIPINE BESYLATE 10 MG PO TABS
10.0000 mg | ORAL_TABLET | Freq: Every day | ORAL | Status: DC
  Administered 2023-05-31: 10 mg via ORAL
  Filled 2023-05-31: qty 1

## 2023-05-31 MED ORDER — HYDRALAZINE HCL 50 MG PO TABS
50.0000 mg | ORAL_TABLET | Freq: Three times a day (TID) | ORAL | Status: DC
  Administered 2023-05-31: 50 mg via ORAL
  Filled 2023-05-31: qty 1

## 2023-05-31 NOTE — Progress Notes (Signed)
PT Cancellation Note  Patient Details Name: [name] MRN: [id number] DOB: [birth year]   Cancelled Treatment:    Reason Eval/Treat Not Completed: PT screened, no needs identified, will sign off  Patient is a LTC resident and has orders to return to facility today. No PT evaluation indicated.    Jerolyn Center, PT Acute Rehabilitation Services  Office [phone]  Zena Amos 05/31/2023, 9:59 AM

## 2023-05-31 NOTE — [place] FL2 (Signed)
[place] MEDICAID FL2 LEVEL OF CARE FORM     IDENTIFICATION  Patient Name: NABEEL KUCHER Birthdate: 08/23/1956 Sex: male Admission Date (Current Location): 05/28/2023  [facility] and [place] Number:  Producer, television/film/video and Address:  [facility], [address] [place], [place] [address]      Provider Number: [id number]  Attending Physician Name and Address:  Maretta Bees, MD  Relative Name and Phone Number:       Current Level of Care: Hospital Recommended Level of Care: Skilled Nursing Facility Prior Approval Number:    Date Approved/Denied:   PASRR Number: [id number]  Discharge Plan: SNF    Current Diagnoses: Patient Active Problem List   Diagnosis Date Noted   Severe sepsis (HCC) 05/28/2023   Acute encephalopathy 05/28/2023   Acute respiratory failure with hypoxia (HCC) 05/28/2023   Benign hypertension 04/01/2023   Precordial pain 03/31/2023   History of right coronary artery stent placement 03/31/2023   Current use of long term anticoagulation 03/31/2023   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) 03/31/2023   Paroxysmal atrial fibrillation (HCC) 03/31/2023   Hypertensive crisis 03/31/2023   Unstable angina (HCC) 03/30/2023   Tobacco abuse 03/30/2023   Prolonged QT interval 03/30/2023   Elevated troponin 03/30/2023   Cellulitis 11/20/2022   Chest wall pain 07/04/2022   S/P BKA (below knee amputation) unilateral, left (HCC) 07/04/2022   Subacute osteomyelitis, left ankle and foot (HCC) 07/03/2022   Physical deconditioning 01/13/2022   Anemia of renal disease 01/13/2022   Obesity (BMI 30-39.9) 01/13/2022   Decubitus ulcer of buttock 01/13/2022   Intractable nausea and vomiting with epigastric pain 01/11/2022   Chronic osteomyelitis (HCC) 01/11/2022   Proliferative diabetic retinopathy of left eye with macular edema associated with type 2 diabetes mellitus (HCC) 11/27/2021   Vitreous hemorrhage of right  eye (HCC) 11/27/2021   Sepsis (HCC) 09/02/2021   Severe sepsis with lactic acidosis (HCC) 06/14/2021   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) 06/14/2021   ESRD on hemodialysis with hyperkalemia 06/14/2021   Paroxysmal atrial fibrillation with RVR (HCC) 06/14/2021   Carotid stenosis 06/15/2020   Dyslipidemia 06/15/2020   Diabetic macular edema of right eye with proliferative retinopathy associated with type 2 diabetes mellitus (HCC) 01/18/2020   Severe nonproliferative diabetic retinopathy of left eye, with macular edema, associated with type 2 diabetes mellitus (HCC) 01/04/2020   Retinal hemorrhage of right eye 01/04/2020   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   Pseudoclaudication 11/15/2018   S/P BKA (below knee amputation), right (HCC) 09/18/2018   Amputation of toe of left foot (HCC) 09/18/2018   Peripheral artery disease (HCC) 09/18/2018   S/P carotid endarterectomy 09/18/2018   OSA not tolerating CPAP 09/18/2018   Chronic back pain 09/18/2018   Status post reversal of ileostomy 05/21/2018   Normocytic anemia 02/14/2018   Intra-abdominal abscess (HCC) 11/04/2017   Chronic venous hypertension (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 (HCC) 12/11/2016   PAD (peripheral artery disease) (HCC) 11/12/2016   Unilateral primary osteoarthritis, left knee 10/11/2016   History of right below knee amputation (HCC) 07/12/2016   Infected diabetic left foot ulcer with chronic osteomyelitis    Diabetic foot infection (HCC) 05/04/2016   Insulin dependent type 2 diabetes mellitus (HCC) 12/16/2015   Amputated toe (HCC) 10/21/2015   Cellulitis of left lower extremity 09/03/2015   Leg edema, left 09/03/2015   CAD (dz of distal, mid and proximal RCA with implantation of 3 overlapping  drug-eluting stent, with elevated troponin 09/03/2015   Chronic diastolic heart failure, NYHA class 2 (HCC) 09/03/2015   Hyperlipidemia 08/25/2014   Limb pain 03/20/2013   DJD  (degenerative joint disease) 09/25/2012   Migraine 09/25/2012   Neuropathy 09/25/2012   Restless legs syndrome (RLS) 09/25/2012   Chronic obstructive pulmonary disease, unspecified (HCC) 04/25/2012   Unknown cause of morbidity or mortality 04/25/2012   Chronic total occlusion of artery of the extremities (HCC) 04/08/2012   Onychomycosis 02/01/2012   Occlusion and stenosis of carotid artery without mention of cerebral infarction 06/12/2011   Barrett's esophagus without dysplasia 05/08/2011   Former tobacco use 02/01/2011    Orientation RESPIRATION BLADDER Height & Weight     Self, Time, Situation, Place  Normal Incontinent, External catheter Weight: (S) 243 lb 6.2 oz (110.4 kg) (Bed Scale) Height:     BEHAVIORAL SYMPTOMS/MOOD NEUROLOGICAL BOWEL NUTRITION STATUS      Continent Diet (See dc summary)  AMBULATORY STATUS COMMUNICATION OF NEEDS Skin   Extensive Assist Verbally Normal                       Personal Care Assistance Level of Assistance  Bathing, Feeding, Dressing Bathing Assistance: Maximum assistance Feeding assistance: Limited assistance Dressing Assistance: Maximum assistance     Functional Limitations Info             SPECIAL CARE FACTORS FREQUENCY                       Contractures Contractures Info: Not present    Additional Factors Info  Code Status, Allergies Code Status Info: Full Allergies Info: Contrast Media (Iodinated Contrast Media), Ivp Dye (Iodinated Contrast Media), Adhesive (Tape), Latex           Current Medications (05/31/2023):  This is the current hospital active medication list Current Facility-Administered Medications  Medication Dose Route Frequency Provider Last Rate Last Admin   0.9 %  sodium chloride infusion  250 mL Intravenous PRN Orland Mustard, MD       acetaminophen (TYLENOL) tablet 650 mg  650 mg Oral Q6H PRN Orland Mustard, MD   650 mg at 05/31/23 0017   Or   acetaminophen (TYLENOL) suppository 650 mg  650  mg Rectal Q6H PRN Orland Mustard, MD       amiodarone (PACERONE) tablet 200 mg  200 mg Oral Daily Orland Mustard, MD   200 mg at 05/30/23 1406   amLODipine (NORVASC) tablet 10 mg  10 mg Oral Daily Ghimire, Werner Lean, MD       apixaban Everlene Balls) tablet 5 mg  5 mg Oral BID Orland Mustard, MD   5 mg at 05/30/23 2154   atorvastatin (LIPITOR) tablet 40 mg  40 mg Oral QHS Orland Mustard, MD   40 mg at 05/30/23 2154   cefTRIAXone (ROCEPHIN) 2 g in sodium chloride 0.9 % 100 mL IVPB  2 g Intravenous Daily Maretta Bees, MD   Stopped at 05/30/23 1453   Chlorhexidine Gluconate Cloth 2 % PADS 6 each  6 each Topical Q0600 Delano Metz, MD   6 each at 05/31/23 0656   Chlorhexidine Gluconate Cloth 2 % PADS 6 each  6 each Topical Q0600 Delano Metz, MD   6 each at 05/31/23 0656   docusate sodium (COLACE) capsule 100 mg  100 mg Oral BID Orland Mustard, MD   100 mg at 05/30/23 2154   doxercalciferol (HECTOROL) injection 1 mcg  1 mcg Intravenous  Salley Slaughter, MD   1 mcg at 05/30/23 1151   doxycycline (VIBRA-TABS) tablet 100 mg  100 mg Oral Q12H Maretta Bees, MD   100 mg at 05/30/23 2154   famotidine (PEPCID) tablet 20 mg  20 mg Oral Emelda Fear, MD   20 mg at 05/29/23 1440   fentaNYL (DURAGESIC) 25 MCG/HR 1 patch  1 patch Transdermal Q72H Burnadette Pop, MD   1 patch at 05/29/23 1427   folic acid (FOLVITE) tablet 1 mg  1 mg Oral Daily Orland Mustard, MD   1 mg at 05/30/23 1406   gabapentin (NEURONTIN) capsule 100 mg  100 mg Oral TID Orland Mustard, MD   100 mg at 05/30/23 2154   hydrALAZINE (APRESOLINE) tablet 50 mg  50 mg Oral Q8H Ghimire, Werner Lean, MD       icosapent Ethyl (VASCEPA) 1 g capsule 1 g  1 g Oral Emelda Fear, MD   1 g at 05/29/23 1439   insulin aspart (novoLOG) injection 0-6 Units  0-6 Units Subcutaneous TID WC Orland Mustard, MD   1 Units at 05/30/23 1418   [START ON 06/01/2023] isosorbide mononitrate (IMDUR) 24 hr tablet 60 mg  60 mg Oral Daily  Ghimire, Werner Lean, MD       labetalol (NORMODYNE) tablet 100 mg  100 mg Oral BID Ghimire, Shanker M, MD       loratadine (CLARITIN) tablet 10 mg  10 mg Oral Daily Orland Mustard, MD   10 mg at 05/30/23 1406   metoCLOPramide (REGLAN) tablet 5 mg  5 mg Oral TID AC & HS Orland Mustard, MD   5 mg at 05/31/23 0656   midodrine (PROAMATINE) tablet 10 mg  10 mg Oral Daily PRN Orland Mustard, MD       multivitamin (RENA-VIT) tablet 1 tablet  1 tablet Oral QHS Orland Mustard, MD   1 tablet at 05/30/23 2154   nicotine (NICODERM CQ - dosed in mg/24 hours) patch 21 mg  21 mg Transdermal QHS Orland Mustard, MD   21 mg at 05/30/23 2154   ondansetron (ZOFRAN) tablet 4 mg  4 mg Oral Q6H PRN Orland Mustard, MD       Or   ondansetron [facility]) injection 4 mg  4 mg Intravenous Q6H PRN Orland Mustard, MD       oxyCODONE-acetaminophen (PERCOCET/ROXICET) 5-325 MG per tablet 1 tablet  1 tablet Oral Q8H PRN Orland Mustard, MD   1 tablet at 05/31/23 0732   rOPINIRole (REQUIP) tablet 2 mg  2 mg Oral QHS Orland Mustard, MD   2 mg at 05/30/23 2155   senna-docusate (Senokot-S) tablet 2 tablet  2 tablet Oral QHS Orland Mustard, MD   2 tablet at 05/30/23 2154   sodium chloride flush (NS) 0.9 % injection 3 mL  3 mL Intravenous Q12H Orland Mustard, MD   3 mL at 05/29/23 2044   sodium chloride flush (NS) 0.9 % injection 3 mL  3 mL Intravenous PRN Orland Mustard, MD       tamsulosin [facility]) capsule 0.4 mg  0.4 mg Oral Daily Orland Mustard, MD   0.4 mg at 05/30/23 1405     Discharge Medications: Please see discharge summary for a list of discharge medications.  Relevant Imaging Results:  Relevant Lab Results:   Additional Information ss#[id number].   HD patient  Mearl Latin, LCSW

## 2023-05-31 NOTE — Progress Notes (Signed)
D/C order noted. Contacted [facility] to advise clinic of pt's d/c today and that pt should resume care tomorrow.   Olivia Canter Renal Navigator [phone]

## 2023-05-31 NOTE — Plan of Care (Signed)
Patient is discharging back to skilled facility today.

## 2023-05-31 NOTE — Evaluation (Signed)
Clinical/Bedside Swallow Evaluation Patient Details  Name: [name] MRN: [id number] Date of Birth: 09-28-1955  Today's Date: 05/31/2023 Time: SLP Start Time (ACUTE ONLY): 4098 SLP Stop Time (ACUTE ONLY): 0840 SLP Time Calculation (min) (ACUTE ONLY): 7 min  Past Medical History:  Past Medical History:  Diagnosis Date   Carotid artery occlusion 11/10/2010   LEFT CAROTID ENDARTERECTOMY   Complication of anesthesia    BP WENT UP AT [facility] "   COPD (chronic obstructive pulmonary disease) (HCC)    pt denies this dx as of 06/01/20 - no inhaler    Diabetes mellitus without complication (HCC)    Diverticulitis    Diverticulosis of colon (without mention of hemorrhage)    DJD (degenerative joint disease)    knees/hands/feet/back/neck   ESRD (end stage renal disease) on dialysis (HCC)    Fatty liver    Full dentures    GERD (gastroesophageal reflux disease)    H/O hiatal hernia    History of blood transfusion    with a past surical procedure per patient 06/01/20   Hyperlipidemia    Hypertension    Neuromuscular disorder (HCC)    peripheral neuropathy   Non-pressure chronic ulcer of other part of left foot limited to breakdown of skin (HCC) 11/12/2016   Osteomyelitis (HCC)    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 Atherectomy left popliteal artery July 2012.    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   Unstable angina (HCC) 09/16/2018   Wears glasses    Past Surgical History:  Past Surgical History:  Procedure Laterality Date   ABDOMINAL AORTOGRAM W/LOWER EXTREMITY N/A 06/23/2021   Procedure: ABDOMINAL AORTOGRAM W/LOWER EXTREMITY;  Surgeon: Elder Negus, MD;  [facility];  Service: Cardiovascular;  Laterality: N/A;   AMPUTATION  11/05/2011   Procedure: AMPUTATION RAY;  Surgeon:  Toni Arthurs, MD;  Location: [facility] OR;  Service: Orthopedics;  Laterality: Right;  Amputation of Right 4&5th Toes   AMPUTATION Left 11/26/2012   Procedure: AMPUTATION RAY;  Surgeon: Toni Arthurs, MD;  Location: [facility] OR;  Service: Orthopedics;  Laterality: Left;  fourth ray amputation   AMPUTATION Right 08/27/2014   Procedure: Transmetatarsal Amputation;  Surgeon: Nadara Mustard, MD;  Location: [facility] OR;  Service: Orthopedics;  Laterality: Right;   AMPUTATION Right 01/14/2015   Procedure: AMPUTATION BELOW KNEE;  Surgeon: Nadara Mustard, MD;  Location[facility] OR;  Service: Orthopedics;  Laterality: Right;   AMPUTATION Left 10/21/2015   Procedure: Left Foot 5th Ray Amputation;  Surgeon: Nadara Mustard, MD;  Location: [facility] OR;  Service: Orthopedics;  Laterality: Left;   AMPUTATION Left 07/04/2022   Procedure: LEFT BELOW KNEE AMPUTATION;  Surgeon: Nadara Mustard, MD;  Location: [facility] OR;  Service: Orthopedics;  Laterality: Left;   ANTERIOR FUSION CERVICAL SPINE  02/06/06   C4-5, C5-6, C6-7; SURGEON DR. MAX COHEN   AV FISTULA PLACEMENT Left 06/02/2020   Procedure: ARTERIOVENOUS (AV) FISTULA CREATION LEFT;  Surgeon: Maeola Harman, MD;  Location: [facility] OR;  Service: Vascular;  Laterality: Left;   BACK SURGERY     x 3   BASCILIC VEIN TRANSPOSITION Left 07/21/2020   Procedure: LEFT UPPER ARM ATERIOVENOUS SUPERFISTULALIZATION;  Surgeon: Maeola Harman, MD;  Location: [facility] OR;  Service: Vascular;  Laterality: Left;   BELOW KNEE LEG AMPUTATION Right    CARDIAC CATHETERIZATION  10/31/04   2009   CAROTID ENDARTERECTOMY  11/10/10   CAROTID ENDARTERECTOMY Left 11/10/2010   Subtotal occlusion of left internal carotid artery with left hemispheric transient ischemic attacks.   CAROTID STENT     CARPAL TUNNEL RELEASE Right 10/21/2013   Procedure: RIGHT CARPAL TUNNEL RELEASE;  Surgeon: Nicki Reaper, MD;  Location: [facility];  Service: Orthopedics;  Laterality: Right;   CHOLECYSTECTOMY     COLON SURGERY      COLONOSCOPY     COLOSTOMY REVERSAL  05/21/2018   ileostomy reversal   CYSTOSCOPY WITH STENT PLACEMENT Bilateral 01/13/2018   Procedure: CYSTOSCOPY WITH BILATERAL URETERAL CATHETER PLACEMENT;  Surgeon: Crist Fat, MD;  Location: [facility]S;  Service: Urology;  Laterality: Bilateral;   ESOPHAGEAL MANOMETRY Bilateral 07/19/2014   Procedure: ESOPHAGEAL MANOMETRY (EM);  Surgeon: Beverley Fiedler, MD;  Location: [facility];  Service: Gastroenterology;  Laterality: Bilateral;   EYE SURGERY Bilateral 2020   cataract   FEMORAL ARTERY STENT     x6   FINGER SURGERY     FOOT SURGERY  04/25/2016    EXCISION BASE 5TH METATARSAL AND PARTIAL CUBOID LEFT FOOT   HERNIA REPAIR     LEFT INGUINAL AND UMBILICAL REPAIRS   HERNIA REPAIR     I & D EXTREMITY Left 04/25/2016   Procedure: EXCISION BASE 5TH METATARSAL AND PARTIAL CUBOID LEFT FOOT;  Surgeon: Nadara Mustard, MD;  [facility];  Service: Orthopedics;  Laterality: Left;   ILEOSTOMY  01/13/2018   Procedure: ILEOSTOMY;  Surgeon: Berna Bue, MD;  Location: [facility];  Service: General;;   ILEOSTOMY CLOSURE N/A 05/21/2018   Procedure: ILEOSTOMY REVERSAL ERAS PATHWAY;  Surgeon: Berna Bue, MD;  Location[facility];  Service: General;  Laterality: N/A;   IR RADIOLOGIST EVAL & MGMT  11/19/2017   IR RADIOLOGIST EVAL & MGMT  12/03/2017   IR RADIOLOGIST EVAL & MGMT  12/18/2017   JOINT REPLACEMENT Right 2001   Total knee   LAMINECTOMY     X 3 LUMBAR AND X 2 CERVICAL SPINE OPERATIONS   LAPAROSCOPIC CHOLECYSTECTOMY W/ CHOLANGIOGRAPHY  11/09/04   SURGEON DR. Caleen Essex   LEFT HEART CATH AND CORONARY ANGIOGRAPHY N/A 09/16/2018   Procedure: LEFT HEART CATH AND CORONARY ANGIOGRAPHY;  Surgeon: Elder Negus, MD;  [facility];  Service: Cardiovascular;  Laterality: N/A;   LEFT HEART CATHETERIZATION WITH CORONARY ANGIOGRAM N/A 10/29/2014   Procedure: LEFT HEART CATHETERIZATION WITH CORONARY ANGIOGRAM;  Surgeon: Pamella Pert, MD;   Location: [facility] CATH LAB;  Service: Cardiovascular;  Laterality: N/A;   LIGATION OF COMPETING BRANCHES OF ARTERIOVENOUS FISTULA Left 07/21/2020   Procedure: LIGATION OF COMPETING BRANCHES OF LEFT UPPER ARM ARTERIOVENOUS FISTULA;  Surgeon: Maeola Harman, MD;  Location: [facility] OR;  Service: Vascular;  Laterality: Left;   LOWER EXTREMITY ANGIOGRAM N/A 03/19/2012   Procedure: LOWER EXTREMITY ANGIOGRAM;  Surgeon: Kathleene Hazel, MD;  Location: [facility] CATH LAB;  Service: Cardiovascular;  Laterality: N/A;   LOWER EXTREMITY ANGIOGRAPHY N/A 06/20/2021   Procedure: LOWER EXTREMITY ANGIOGRAPHY;  Surgeon: Elder Negus, MD;  [facility];  Service: Cardiovascular;  Laterality: N/A;   NECK SURGERY     PARTIAL COLECTOMY N/A 01/13/2018   Procedure: LAPAROSCOPIC ASSISTED   SIGMOID COLECTOMY ILEOSTOMY;  Surgeon: Berna Bue, MD;  Location: [facility];  Service: General;  Laterality: N/A;   PENILE PROSTHESIS IMPLANT  08/14/05   INFRAPUBIC INSERTION OF INFLATABLE PENILE PROSTHESIS; SURGEON DR. Logan Bores   PENILE PROSTHESIS IMPLANT     PERCUTANEOUS CORONARY STENT INTERVENTION (PCI-S) Right 10/29/2014   Procedure: PERCUTANEOUS CORONARY STENT INTERVENTION (PCI-S);  Surgeon: Pamella Pert, MD;  Location: [facility] CATH LAB;  Service: Cardiovascular;  Laterality: Right;   PERIPHERAL VASCULAR INTERVENTION Left 06/23/2021   Procedure: PERIPHERAL VASCULAR INTERVENTION;  Surgeon: Elder Negus, MD;  [facility];  Service: Cardiovascular;  Laterality: Left;   REMOVAL OF PENILE PROSTHESIS N/A 06/14/2021   Procedure: Removal of THREE piece inflatable penile prosthesis;  Surgeon: Crista Elliot, MD;  Location: [facility] OR;  Service: Urology;  Laterality: N/A;   SHOULDER ARTHROSCOPY     SPINE SURGERY     TOE AMPUTATION Left    TONSILLECTOMY     TOTAL KNEE ARTHROPLASTY  07/2002   RIGHT KNEE ; SURGEON  DR. [name] ALSO HAD ARTHROSCOPIC RIGHT KNEE IN  10/2001   TOTAL KNEE ARTHROPLASTY      ULNAR NERVE TRANSPOSITION Right 10/21/2013   Procedure: RIGHT ELBOW  ULNAR NERVE DECOMPRESSION;  Surgeon: Nicki Reaper, MD;  Location: [facility];  Service: Orthopedics;  Laterality: Right;   HPI:  Patient is a 67 y.o.  male with history of ESRD on HD, PAD-s/p bilateral BKA, PAF on Eliquis, CAD, HTN, HLD, DM-2-who presented from his nursing facility with confusion/weakness-he was found to have severe sepsis-suspicion for aspiration pneumonia and subsequently admitted to the hospitalist service. CT chest/abdomen/pelvis: Retained mucus/debris in the distal trachea and central airways-dependent atelectasis both bases. BSE 10/23 when he endorsed globus sensation, no s/s aspiration, reg/thin recommended.    Assessment / Plan / Recommendation  Clinical Impression  Pt denied swallow difficulty except sometimes when he tries to swallow multiple pills at once. He has a history of GERD in chart but today denied having any symptoms. Last year he did have globus sensation during bedside swallow assessment but today he denies. His oromotor exam was intact and has upper denutre plate, no lower dentition and does not wear them while eating. He presents with functional oropharyngeal swallow today across textures. Multiple sips thin consumed via straw without s/s aspiration. Oral mastication of cracker was timely and complete. He denied pharyngeal globus sensation.  Do not suspect aspiration during the swallow - if there are concerns if could be post prandial. Encouraged pt to stay upright after meals. Recommend continue regular texture, thin liquids, take pills 1-2 at at time with water. No further ST needed. SLP Visit Diagnosis: Dysphagia, unspecified (R13.10)    Aspiration Risk  No limitations    Diet Recommendation Regular;Thin liquid    Liquid Administration via: Straw;Cup Medication Administration: Whole meds with liquid (1-2 at a time) Supervision: Patient able to self feed Postural  Changes: Seated upright at 90 degrees    Other  Recommendations Oral Care Recommendations: Oral care BID    Recommendations for follow up therapy are one component of a multi-disciplinary discharge planning process, led by the attending physician.  Recommendations may be updated based on patient status, additional functional criteria and insurance authorization.  Follow up Recommendations No SLP follow up      Assistance Recommended at Discharge    Functional Status Assessment Patient has not had a recent decline in their functional status  Frequency and Duration            Prognosis  Swallow Study   General Date of Onset: 05/28/23 HPI: Patient is a 67 y.o.  male with history of ESRD on HD, PAD-s/p bilateral BKA, PAF on Eliquis, CAD, HTN, HLD, DM-2-who presented from his nursing facility with confusion/weakness-he was found to have severe sepsis-suspicion for aspiration pneumonia and subsequently admitted to the hospitalist service. CT chest/abdomen/pelvis: Retained mucus/debris in the distal trachea and central airways-dependent atelectasis both bases. BSE 10/23 when he endorsed globus sensation, no s/s aspiration, reg/thin recommended. Type of Study: Bedside Swallow Evaluation Previous Swallow Assessment:  (see HPI) Diet Prior to this Study: Regular;Thin liquids (Level 0) Temperature Spikes Noted: No Respiratory Status: Room air History of Recent Intubation: No Behavior/Cognition: Alert;Cooperative;Pleasant mood Oral Cavity Assessment: Within Functional Limits Oral Care Completed by SLP: No Oral Cavity - Dentition: Dentures, top (missing lower, does not wear bottom dentures while eating) Vision: Functional for self-feeding Self-Feeding Abilities: Able to feed self Patient Positioning: Upright in bed Baseline Vocal Quality: Normal Volitional Cough: Strong Volitional Swallow: Able to elicit    Oral/Motor/Sensory Function Overall Oral Motor/Sensory Function: Within  functional limits   Ice Chips Ice chips: Not tested   Thin Liquid Thin Liquid: Within functional limits Presentation: Straw    Nectar Thick Nectar Thick Liquid: Not tested   Honey Thick Honey Thick Liquid: Not tested   Puree Puree: Within functional limits   Solid     Solid: Within functional limits      Roque Cash, Breck Coons 05/31/2023,8:55 AM

## 2023-05-31 NOTE — Discharge Planning (Signed)
[facility] Patient Discharge Orders- [facility] CLINIC: [facility]  Patient's name: [name] Admit/DC Dates: 05/28/2023 - 05/31/2023  Discharge Diagnoses: Sepsis 2/2 Aspiration PNA  Acute Metabolic Encephalopathy Acute Hypoxic respiratory failure  Aranesp: Given: NA   Date and amount of last dose: NA  Last Hgb: 10.3 PRBC's Given: None Date/# of units: NA ESA dose for discharge: mircera 75  mcg IV q 2 weeks  IV Iron dose at discharge: Resume iron load when patient completes oral ABX  Heparin change: NA  EDW Change: Yes New EDW: 109 kg  Bath Change: No  Access intervention/Change: No Details:  Hectorol/Calcitriol change: NA  Discharge Labs: Calcium 8.1 Phosphorus 3.5 Albumin 2.6 K+ 3.8  IV Antibiotics: No-oral augmentin appropriately dosed  Details:  On Coumadin?: No-on apixaban Last INR: Next INR: Managed By:   OTHER/APPTS/LAB ORDERS:    D/C Meds to be reconciled by nurse after every discharge.  Completed By: Alonna Buckler [facility] [phone]    Reviewed by: MD:______ RN_______

## 2023-05-31 NOTE — Plan of Care (Signed)
Problem: Education: Goal: Knowledge of the prescribed therapeutic regimen will improve Outcome: Progressing Goal: Ability to verbalize activity precautions or restrictions will improve Outcome: Progressing Goal: Understanding of discharge needs will improve Outcome: Progressing   Problem: Activity: Goal: Ability to perform//tolerate increased activity and mobilize with assistive devices will improve Outcome: Progressing   Problem: Clinical Measurements: Goal: Postoperative complications will be avoided or minimized Outcome: Progressing   Problem: Self-Care: Goal: Ability to meet self-care needs will improve Outcome: Progressing   Problem: Self-Concept: Goal: Ability to maintain and perform role responsibilities to the fullest extent possible will improve Outcome: Progressing   Problem: Pain Management: Goal: Pain level will decrease with appropriate interventions Outcome: Progressing   Problem: Skin Integrity: Goal: Demonstration of wound healing without infection will improve Outcome: Progressing   Problem: Fluid Volume: Goal: Hemodynamic stability will improve Outcome: Progressing   Problem: Clinical Measurements: Goal: Diagnostic test results will improve Outcome: Progressing Goal: Signs and symptoms of infection will decrease Outcome: Progressing   Problem: Respiratory: Goal: Ability to maintain adequate ventilation will improve Outcome: Progressing   Problem: Education: Goal: Ability to describe self-care measures that may prevent or decrease complications (Diabetes Survival Skills Education) will improve Outcome: Progressing Goal: Individualized Educational Video(s) Outcome: Progressing   Problem: Coping: Goal: Ability to adjust to condition or change in health will improve Outcome: Progressing   Problem: Fluid Volume: Goal: Ability to maintain a balanced intake and output will improve Outcome: Progressing   Problem: Health Behavior/Discharge  Planning: Goal: Ability to identify and utilize available resources and services will improve Outcome: Progressing Goal: Ability to manage health-related needs will improve Outcome: Progressing   Problem: Metabolic: Goal: Ability to maintain appropriate glucose levels will improve Outcome: Progressing   Problem: Nutritional: Goal: Maintenance of adequate nutrition will improve Outcome: Progressing Goal: Progress toward achieving an optimal weight will improve Outcome: Progressing   Problem: Skin Integrity: Goal: Risk for impaired skin integrity will decrease Outcome: Progressing   Problem: Tissue Perfusion: Goal: Adequacy of tissue perfusion will improve Outcome: Progressing   Problem: Activity: Goal: Ability to tolerate increased activity will improve Outcome: Progressing   Problem: Clinical Measurements: Goal: Ability to maintain a body temperature in the normal range will improve Outcome: Progressing   Problem: Respiratory: Goal: Ability to maintain adequate ventilation will improve Outcome: Progressing Goal: Ability to maintain a clear airway will improve Outcome: Progressing   Problem: Education: Goal: Knowledge of General Education information will improve Description: Including pain rating scale, medication(s)/side effects and non-pharmacologic comfort measures Outcome: Progressing   Problem: Health Behavior/Discharge Planning: Goal: Ability to manage health-related needs will improve Outcome: Progressing   Problem: Clinical Measurements: Goal: Ability to maintain clinical measurements within normal limits will improve Outcome: Progressing Goal: Will remain free from infection Outcome: Progressing Goal: Diagnostic test results will improve Outcome: Progressing Goal: Respiratory complications will improve Outcome: Progressing Goal: Cardiovascular complication will be avoided Outcome: Progressing   Problem: Activity: Goal: Risk for activity intolerance  will decrease Outcome: Progressing   Problem: Nutrition: Goal: Adequate nutrition will be maintained Outcome: Progressing   Problem: Coping: Goal: Level of anxiety will decrease Outcome: Progressing   Problem: Elimination: Goal: Will not experience complications related to bowel motility Outcome: Progressing Goal: Will not experience complications related to urinary retention Outcome: Progressing   Problem: Pain Managment: Goal: General experience of comfort will improve Outcome: Progressing   Problem: Safety: Goal: Ability to remain free from injury will improve Outcome: Progressing   Problem: Skin Integrity: Goal: Risk for impaired skin  integrity will decrease Outcome: Progressing

## 2023-05-31 NOTE — TOC Transition Note (Signed)
Transition of Care [facility]) - CM/SW Discharge Note   Patient Details  Name: [name] MRN: [id number] Date of Birth: November 26, 1955  Transition of Care [facility]) CM/SW Contact:  Mearl Latin, LCSW Phone Number: 05/31/2023, 1:27 PM   Clinical Narrative:    Patient will DC to: [facility] Anticipated DC date: 05/31/23 Family notified: Pt stated he made family aware Transport by: Sharin Mons   Per MD patient ready for DC to [facility]. RN to call report prior to discharge [phone] room [address] [employer]). RN, patient, patient's family, and facility notified of DC. Discharge Summary and FL2 sent to facility. DC packet on chart. Ambulance transport requested for patient.   CSW will sign off for now as social work intervention is no longer needed. Please consult [place] again if new needs arise.     Final next level of care: Skilled Nursing Facility Barriers to Discharge: Barriers Resolved   Patient Goals and CMS Choice CMS Medicare.gov Compare Post Acute Care list provided to:: Patient Choice offered to / list presented to : Patient  Discharge Placement     Existing PASRR number confirmed : 05/31/23          Patient chooses bed at: [facility] Patient to be transferred to facility by: [facility] Name of family member notified: Pt notified family Patient and family notified of of transfer: 05/31/23  Discharge Plan and Services Additional resources added to the After Visit Summary for   In-house Referral: Clinical Social Work   Post Acute Care Choice: Skilled Nursing Facility                               Social Determinants of Health (SDOH) Interventions SDOH Screenings   Food Insecurity: No Food Insecurity (05/28/2023)  Housing: Low Risk  (05/28/2023)  Transportation Needs: No Transportation Needs (05/28/2023)  Utilities: Not At Risk (05/28/2023)  Tobacco Use: High Risk (04/24/2023)     Readmission Risk Interventions    05/29/2023    4:41 PM 06/30/2021   11:53 AM   Readmission Risk Prevention Plan  Transportation Screening Complete Complete  Medication Review [employer]) Complete Complete  PCP or Specialist appointment within 3-5 days of discharge Complete   HRI or Home Care Consult Complete Complete  SW Recovery Care/Counseling Consult Complete Complete  Palliative Care Screening Not Applicable Not Applicable  Skilled Nursing Facility Complete Not Applicable

## 2023-05-31 NOTE — Progress Notes (Signed)
OT Screen Note  Patient Details Name: RUEBEN LINS MRN: [id number] DOB: [birth year]   Cancelled Treatment:    Reason Eval/Treat Not Completed: OT screened, no needs identified, will sign off (Patient is a LTC resident and has orders to return to facility today. Max A needed for ADLs. OT signing off as pt to continue with level of care when DC'd)  05/31/2023  AB, OTR/L  Acute Rehabilitation Services  Office: [phone]  Tristan Schroeder 05/31/2023, 12:17 PM

## 2023-05-31 NOTE — Progress Notes (Signed)
Report called to [facility] report given to [employer]. Patient to transport via [facility].

## 2023-05-31 NOTE — Care Management Important Message (Signed)
Important Message  Patient Details  Name: [name] MRN: [id number] Date of Birth: 12-May-1956   Medicare Important Message Given:  Yes     Eden Rho 05/31/2023, 3:01 PM

## 2023-05-31 NOTE — Discharge Summary (Signed)
PATIENT DETAILS Name: [name] Age: 67 y.o. Sex: male Date of Birth: 06/12/1956 MRN: [id number]. Admitting Physician: Orland Mustard, MD NWG:[id number], Aldean Jewett  Admit Date: 05/28/2023 Discharge date: 05/31/2023  Recommendations for Outpatient Follow-up:  Follow up with PCP in 1-2 weeks Please obtain CMP/CBC in one week  Admitted From:  SNF  Disposition: Skilled nursing facility   Discharge Condition: good  CODE STATUS:   Code Status: Full Code   Diet recommendation:  Diet Order             Diet - low sodium heart healthy           Diet general           Diet Carb Modified Fluid consistency: Thin; Room service appropriate? Yes  Diet effective now                    Brief Summary: Patient is a 67 y.o.  male with history of ESRD on HD, PAD-s/p bilateral BKA, PAF on Eliquis, CAD, HTN, HLD, DM-2-who presented from his nursing facility with confusion/weakness-he was found to have severe sepsis-suspicion for aspiration pneumonia and subsequently admitted to the hospitalist service.  See below for further details   Significant events: 9/17>> admit to [facility]   Significant studies: 9/17>> CT chest/abdomen/pelvis: Retained mucus/debris in the distal trachea and central airways-dependent atelectasis both bases 9/17>> CT head: No acute findings.   Significant microbiology data: 9/17>> COVID/influenza/RSV PCR: Negative 9/17>> urine culture: No growth 9/17>> blood culture: No growth   Procedures: None   Consults: Nephrology  Brief Hospital Course: Severe sepsis likely secondary to aspiration pneumonia Acute hypoxic respiratory failure due to aspiration pneumonia Improved-sepsis physiology has resolved-leukocytosis has normalized On room air this morning Treated with Rocephin/doxycycline-will transition to Augmentin on discharge.   No overt signs of aspiration on my exam-needs ongoing SLP follow-up at SNF.   Acute metabolic encephalopathy Likely secondary  to above Improved-completely awake and alert CT imaging negative Do not think any further imaging with MRI is indicated at this time as patient is back to baseline.   Hyponatremia Due to fluid overload Mild-asymptomatic Should improve with further HD sessions   ESRD on HD TTS Nephrology following and directing care Discussed with Dr.Schertz-nephrologist-okay to discharge-patient will need to be resumed on his usual dialysis schedule.   Normocytic anemia Secondary to CKD Iron/iron deferred to nephrology service   Minimally elevated troponin Likely demand ischemia-also ESRD patient He does not have any chest pain or shortness of breath. ED physician had discussed with cardiologist-Dr. Ganji-recommendations were for just supportive care Recent echo in July 2024 with stable EF.   HTN BP elevated-will resume all of his antihypertensives-all antihypertensives were initially held as his blood pressure was soft.   Chronic HFpEF Euvolemic Volume removal with HD   PAF Sinus rhythm Telemetry monitoring Amiodarone/Eliquis    CAD s/p PCI 2016 Continue statin Suspect not on ASA as on Eliquis Continue beta-blocker   PAD s/p bilateral BKA Right carotid endarterectomy 2012 Eliquis/Lipitor   HLD Statin  DM-2 CBG stable with just SSI Continue to hold long-acting insulin on discharge-reassess at SNF whether this can restarted.  Recent Labs    05/30/23 1631 05/30/23 2026 05/31/23 0824  GLUCAP 150* 183* 152*      GERD PPI   Chronic pain syndrome Neurontin Transdermal fentanyl/Percocet Appears stable   PAD s/p bilateral BKA Eliquis/Lipitor   OSA Intolerant to CPAP prior notes   Cystic structure in pancreatic body 13 mm  Grossly stable per radiology from prior exam No specific follow-up imaging recommended by radiology    Obesity: Estimated body mass index is 31.25 kg/m as calculated from the following:   Height as of 04/24/23: 6\' 2"  (1.88 m).   Weight as of  this encounter: 110.4 kg.   RN pressure injury documentation: Pressure Injury 01/11/22 Coccyx Right;Left Stage 2 -  Partial thickness loss of dermis presenting as a shallow open injury with a red, pink wound bed without slough. (Active)  01/11/22 2159  Location: Coccyx  Location Orientation: Right;Left  Staging: Stage 2 -  Partial thickness loss of dermis presenting as a shallow open injury with a red, pink wound bed without slough.  Wound Description (Comments):   Present on Admission: Yes     Pressure Injury 06/24/22 Thigh Left;Posterior Deep Tissue Pressure Injury - Purple or maroon localized area of discolored intact skin or blood-filled blister due to damage of underlying soft tissue from pressure and/or shear. (Active)  06/24/22 2043  Location: Thigh  Location Orientation: Left;Posterior  Staging: Deep Tissue Pressure Injury - Purple or maroon localized area of discolored intact skin or blood-filled blister due to damage of underlying soft tissue from pressure and/or shear.  Wound Description (Comments):   Present on Admission:      Discharge Diagnoses:  Principal Problem:   Severe sepsis (HCC) Active Problems:   ESRD on hemodialysis with hyperkalemia   Acute respiratory failure with hypoxia (HCC)   Acute encephalopathy   CAD (dz of distal, mid and proximal RCA with implantation of 3 overlapping drug-eluting stent, with elevated troponin   Insulin dependent type 2 diabetes mellitus (HCC)   Paroxysmal atrial fibrillation (HCC)   Benign hypertension   Peripheral artery disease (HCC)   Hyperlipidemia   Chronic diastolic heart failure, NYHA class 2 (HCC)   Barrett's esophagus without dysplasia   Anemia of renal disease   Chronic back pain   Elevated troponin   Discharge Instructions:  Activity:  As tolerated with Full fall precautions use walker/cane & assistance as needed   Discharge Instructions     Call MD for:  difficulty breathing, headache or visual  disturbances   Complete by: As directed    Call MD for:  temperature >100.4   Complete by: As directed    Diet - low sodium heart healthy   Complete by: As directed    Diet general   Complete by: As directed    Discharge instructions   Complete by: As directed    Follow with Primary MD  Lonie Peak, PA-C in 1-2 weeks  Please get a complete blood count and chemistry panel checked by your Primary MD at your next visit, and again as instructed by your Primary MD.  Get Medicines reviewed and adjusted: Please take all your medications with you for your next visit with your Primary MD  Laboratory/radiological data: Please request your Primary MD to go over all hospital tests and procedure/radiological results at the follow up, please ask your Primary MD to get all Hospital records sent to his/her office.  In some cases, they will be blood work, cultures and biopsy results pending at the time of your discharge. Please request that your primary care M.D. follows up on these results.  Also Note the following: If you experience worsening of your admission symptoms, develop shortness of breath, life threatening emergency, suicidal or homicidal thoughts you must seek medical attention immediately by calling 911 or calling your MD immediately  if symptoms less severe.  You must read complete instructions/literature along with all the possible adverse reactions/side effects for all the Medicines you take and that have been prescribed to you. Take any new Medicines after you have completely understood and accpet all the possible adverse reactions/side effects.   Do not drive when taking Pain medications or sleeping medications (Benzodaizepines)  Do not take more than prescribed Pain, Sleep and Anxiety Medications. It is not advisable to combine anxiety,sleep and pain medications without talking with your primary care practitioner  Special Instructions: If you have smoked or chewed Tobacco  in the  last 2 yrs please stop smoking, stop any regular Alcohol  and or any Recreational drug use.  Wear Seat belts while driving.  Please note: You were cared for by a hospitalist during your hospital stay. Once you are discharged, your primary care physician will handle any further medical issues. Please note that NO REFILLS for any discharge medications will be authorized once you are discharged, as it is imperative that you return to your primary care physician (or establish a relationship with a primary care physician if you do not have one) for your post hospital discharge needs so that they can reassess your need for medications and monitor your lab values.   Increase activity slowly   Complete by: As directed       Allergies as of 05/31/2023       Reactions   Contrast Media [iodinated Contrast Media] Shortness Of Breath, Other (See Comments)   Difficulty breathing and altered mental status   Ivp Dye [iodinated Contrast Media] Anaphylaxis, Shortness Of Breath, Other (See Comments)   Breathing problems, altered mental state   Adhesive [tape] Rash, Other (See Comments)   Rash after 1 day of use Do not leave on longer then 3 days with out be changed   Latex Rash, Other (See Comments)   A severe rash appears after the first 24 hours of being placed        Medication List     STOP taking these medications    fentaNYL 50 MCG/HR Commonly known as: DURAGESIC Replaced by: fentaNYL 25 MCG/HR   insulin glargine 100 UNIT/ML injection Commonly known as: LANTUS       TAKE these medications    amiodarone 200 MG tablet Commonly known as: PACERONE Take 1 tablet (200 mg total) by mouth daily.   amLODipine 10 MG tablet Commonly known as: NORVASC Take 10 mg by mouth daily.   amoxicillin-clavulanate 500-125 MG tablet Commonly known as: Augmentin Take 1 tablet by mouth daily for 3 days. Start taking on: June 01, 2023   apixaban 5 MG Tabs tablet Commonly known as: ELIQUIS Take  1 tablet (5 mg total) by mouth 2 (two) times daily.   atorvastatin 40 MG tablet Commonly known as: LIPITOR Take 40 mg by mouth at bedtime.   DESITIN EX Apply topically See admin instructions. Apply to buttocks topically 2 times a day for dry skin.   Dialyvite/Zinc Tabs Take 1 tablet by mouth Every Tuesday,Thursday,and Saturday with dialysis.   docusate sodium 100 MG capsule Commonly known as: COLACE Take 100 mg by mouth 2 (two) times daily.   famotidine 20 MG tablet Commonly known as: PEPCID Take 20 mg by mouth every other day.   fentaNYL 25 MCG/HR Commonly known as: DURAGESIC Place 1 patch onto the skin every 3 (three) days. Start taking on: June 01, 2023 Replaces: fentaNYL 50 MCG/HR   folic acid 1 MG tablet Commonly known as: FOLVITE Take 1  mg by mouth daily.   gabapentin 100 MG capsule Commonly known as: NEURONTIN Take 100 mg by mouth 3 (three) times daily.   hydrALAZINE 50 MG tablet Commonly known as: APRESOLINE Take 1 tablet (50 mg total) by mouth every 8 (eight) hours.   Icosapent Ethyl 0.5 g Caps Take 1 capsule by mouth daily.   isosorbide mononitrate 60 MG 24 hr tablet Commonly known as: IMDUR Take 1 tablet (60 mg total) by mouth daily.   labetalol 200 MG tablet Commonly known as: NORMODYNE Take 1 tablet (200 mg total) by mouth 2 (two) times daily.   lidocaine-prilocaine cream Commonly known as: EMLA Apply 1 application topically See admin instructions. Prior to Dialysis days Tuesday,Thursday and saturday   loratadine 10 MG tablet Commonly known as: CLARITIN Take 10 mg by mouth daily.   metoCLOPramide 5 MG tablet Commonly known as: Reglan Take 1 tablet (5 mg total) by mouth every 8 (eight) hours as needed for nausea. What changed:  when to take this reasons to take this   midodrine 10 MG tablet Commonly known as: PROAMATINE Take 10 mg by mouth daily as needed (SBP less than 100.).   NEEDLE (REUSABLE) 22 G 22G X 1-1/2" Misc 1 Units by  Does not apply route as directed. For B12 IM inj   nitroGLYCERIN 0.4 MG SL tablet Commonly known as: NITROSTAT Place 0.4 mg under the tongue every 5 (five) minutes as needed for chest pain.   NovoLOG FlexPen 100 UNIT/ML FlexPen Generic drug: insulin aspart Inject 0-12 Units into the skin See admin instructions. Inject 4 units into the skin with meals in addition to sliding scale as needed. Inject as per sliding scale: If 0 - 69 = 0 initiate hypoglycemia orders;  70 - 150 = 0;  151 - 200 = 2;  201 - 250 = 4;  251 - 300 = 6;  301 - 350 = 8;  351 - 400 = 10;  401 - 450 = 12.  If >400, give 12 units, recheck in 1 hour. If still >400, call MD/NP for further orders. Subcutaneously with meals in addition to scheduled 4 unites with meals.   ondansetron 4 MG tablet Commonly known as: Zofran Take 1 tablet (4 mg total) by mouth every 8 (eight) hours as needed for nausea or vomiting.   oxyCODONE-acetaminophen 5-325 MG tablet Commonly known as: PERCOCET/ROXICET Take 1 tablet by mouth every 8 (eight) hours as needed for severe pain.   Polyvinyl Alcohol-Povidone PF 1.4-0.6 % Soln Place 1 drop into both eyes 2 (two) times daily as needed (dry eyes).   rOPINIRole 2 MG tablet Commonly known as: REQUIP Take 2 mg by mouth at bedtime.   senna-docusate 8.6-50 MG tablet Commonly known as: Senokot-S Take 2 tablets by mouth at bedtime.   SYRINGE 3CC/22GX1-1/2" 22G X 1-1/2" 3 ML Misc 1 Syringe by Does not apply route as directed. For b12 IM inj   tamsulosin 0.4 MG Caps capsule Commonly known as: FLOMAX Take 0.4 mg by mouth daily.        Allergies  Allergen Reactions   Contrast Media [Iodinated Contrast Media] Shortness Of Breath and Other (See Comments)    Difficulty breathing and altered mental status     Ivp Dye [Iodinated Contrast Media] Anaphylaxis, Shortness Of Breath and Other (See Comments)    Breathing problems, altered mental state    Adhesive [Tape] Rash and Other (See  Comments)    Rash after 1 day of use Do not leave on  longer then 3 days with out be changed   Latex Rash and Other (See Comments)    A severe rash appears after the first 24 hours of being placed     Other Procedures/Studies: CT CHEST ABDOMEN PELVIS WO CONTRAST  Result Date: 05/28/2023 CLINICAL DATA:  Abdominal pain. Decreased responsiveness. Fever. Dialysis patient. EXAM: CT CHEST, ABDOMEN AND PELVIS WITHOUT CONTRAST TECHNIQUE: Multidetector CT imaging of the chest, abdomen and pelvis was performed following the standard protocol without IV contrast. RADIATION DOSE REDUCTION: This exam was performed according to the departmental dose-optimization program which includes automated exposure control, adjustment of the mA and/or kV according to patient size and/or use of iterative reconstruction technique. COMPARISON:  Abdominopelvic CT 07/03/2022 FINDINGS: CT CHEST FINDINGS Cardiovascular: Borderline cardiomegaly. There are coronary artery calcifications. No pericardial effusion. Moderate aortic atherosclerosis. No aortic aneurysm. Mediastinum/Nodes: Scattered small mediastinal lymph nodes, not enlarged by size criteria. Assessment for hilar adenopathy is limited on this unenhanced exam. There is no esophageal wall thickening. Lungs/Pleura: Dependent atelectasis in the lung bases and perifissural upper lobes. Minimal retained mucus and debris within the distal trachea and central airways. Trace pleural thickening without significant effusion. Musculoskeletal: Thoracic spondylosis with spurring. Remote posterior left rib fractures. There are no acute or suspicious osseous abnormalities. No obvious musculoskeletal infection on this unenhanced exam. There is mild generalized subcutaneous edema. CT ABDOMEN PELVIS FINDINGS Hepatobiliary: Cholecystectomy. No definite biliary dilatation, mild motion artifact. Unremarkable unenhanced appearance of the liver. Pancreas: Parenchymal atrophy. No ductal dilatation or  inflammation. The known cystic structure in the pancreatic body measuring 13 mm is not well-defined on the current exam. This is grossly stable. Spleen: Enlarged spanning 15 cm cranial caudal. Adrenals/Urinary Tract: No adrenal nodule. Bilateral renal parenchymal atrophy. There is perinephric stranding. No hydronephrosis. No definite intrarenal calculi. Both ureters are decompressed. Urinary bladder is only minimally distended. Stomach/Bowel: The stomach is decompressed. There is a small duodenal diverticulum. No bowel obstruction or inflammation. Colonic diverticulosis without diverticulitis. Moderate colonic stool burden. The appendix is not definitively seen. Vascular/Lymphatic: Advanced aortic and branch atherosclerosis. There multiple small retroperitoneal and periportal nodes, not enlarged by size criteria. There is an enlarged right external iliac node at 14 mm. This is slightly enlarged from prior exam. Reproductive: Prostate is unremarkable. Other: No free air or ascites. Postsurgical change of the anterior abdominal wall. Small fat containing supraumbilical ventral abdominal wall hernia. Suspect scarring in the right lower abdomen. There is mild subcutaneous edema of the right anterior upper thigh, series 3, image 25. No soft tissue gas. No focal fluid collection. Musculoskeletal: L4-S1 lumbar fusion hardware. Degenerative change in the upper lumbar spine. No acute osseous findings. No findings to suggest musculoskeletal infection on this unenhanced exam. IMPRESSION: 1. Mild subcutaneous edema of the right anterior upper thigh, can be seen with cellulitis. No soft tissue gas or focal fluid collection. 2. No other acute findings or explanation for fever. 3. Minimal retained mucus and debris within the distal trachea and central airways, can be seen with bronchitis or aspiration. Minor dependent atelectasis in both lungs. 4. Mildly enlarged right external iliac node at 14 mm, slightly enlarged from prior  exam is nonspecific, likely reactive. 5. Colonic diverticulosis without diverticulitis. 6. Splenomegaly. 7. The known cystic structure in the pancreatic body measuring 13 mm is not well-defined on the current exam, however grossly stable from prior imaging. Given stability over multiple exams, no specific imaging follow-up is needed. Aortic Atherosclerosis (ICD10-I70.0). Electronically Signed   By: Ivette Loyal.D.  On: 05/28/2023 18:11   CT Head Wo Contrast  Result Date: 05/28/2023 CLINICAL DATA:  Altered mental status EXAM: CT HEAD WITHOUT CONTRAST TECHNIQUE: Contiguous axial images were obtained from the base of the skull through the vertex without intravenous contrast. RADIATION DOSE REDUCTION: This exam was performed according to the departmental dose-optimization program which includes automated exposure control, adjustment of the mA and/or kV according to patient size and/or use of iterative reconstruction technique. COMPARISON:  04/02/2023 FINDINGS: Brain: Periventricular white matter and corona radiata hypodensities favor chronic ischemic microvascular white matter disease. Suspected remote prior strokes in the left frontal lobe and right parietal vertex, appearance not changed from 04/02/2023. Otherwise, the brainstem, cerebellum, cerebral peduncles, thalamus, basal ganglia, basilar cisterns, and ventricular system appear within normal limits. No intracranial hemorrhage, mass lesion, or acute CVA. Vascular: There is atherosclerotic calcification of the cavernous carotid arteries bilaterally. Skull: Unremarkable Sinuses/Orbits: Unremarkable Other: No supplemental non-categorized findings. IMPRESSION: 1. No acute intracranial findings. 2. Periventricular white matter and corona radiata hypodensities favor chronic ischemic microvascular white matter disease. 3. Suspected remote prior strokes in the left frontal lobe and right parietal vertex. 4. Atherosclerosis. Electronically Signed   By: Gaylyn Rong M.D.   On: 05/28/2023 18:03   DG Chest Port 1 View  Result Date: 05/28/2023 CLINICAL DATA:  Fever, altered level of consciousness EXAM: PORTABLE CHEST 1 VIEW COMPARISON:  04/02/2023 FINDINGS: Single frontal view of the chest demonstrates an unremarkable cardiac silhouette. There is patchy bibasilar airspace disease, right greater than left. No effusion or pneumothorax. No acute bony abnormalities. IMPRESSION: 1. Patchy bibasilar airspace disease greatest at the right lung base, which could reflect pneumonia given clinical presentation. Electronically Signed   By: Sharlet Salina M.D.   On: 05/28/2023 14:55     TODAY-DAY OF DISCHARGE:  Subjective:   [name] today has no headache,no chest abdominal pain,no new weakness tingling or numbness, feels much better wants to go home today.   Objective:   Blood pressure 102/62, pulse 76, temperature 98.7 F (37.1 C), temperature source Oral, resp. rate 13, weight (S) 110.4 kg, SpO2 97%.  Intake/Output Summary (Last 24 hours) at 05/31/2023 0911 Last data filed at 05/30/2023 1331 Gross per 24 hour  Intake --  Output 3000 ml  Net -3000 ml   Filed Weights   05/30/23 0812 05/30/23 1324  Weight: (S) 113.5 kg (S) 110.4 kg    Exam: Awake Alert, Oriented *3, No new F.N deficits, Normal affect [place].AT,PERRAL Supple Neck,No JVD, No cervical lymphadenopathy appriciated.  Symmetrical Chest wall movement, Good air movement bilaterally, CTAB RRR,No Gallops,Rubs or new Murmurs, No Parasternal Heave +ve B.Sounds, Abd Soft, Non tender, No organomegaly appriciated, No rebound -guarding or rigidity. No Cyanosis, Clubbing or edema, No new Rash or bruise   PERTINENT RADIOLOGIC STUDIES: No results found.   PERTINENT LAB RESULTS: CBC: Recent Labs    05/30/23 0621 05/31/23 0716  WBC 9.7 7.5  HGB 9.0* 10.3*  HCT 27.5* 32.3*  PLT 185 215   CMET CMP     Component Value Date/Time   NA 133 (L) 05/31/2023 0716   NA 139 05/30/2020 1646    K 3.8 05/31/2023 0716   CL 98 05/31/2023 0716   CO2 26 05/31/2023 0716   GLUCOSE 154 (H) 05/31/2023 0716   BUN 29 (H) 05/31/2023 0716   BUN 27 05/30/2020 1646   CREATININE 4.19 (H) 05/31/2023 0716   CREATININE 1.00 02/01/2012 1028   CALCIUM 8.1 (L) 05/31/2023 0716   PROT 6.1 (L)  05/28/2023 1243   PROT 6.3 05/30/2020 1646   ALBUMIN 2.6 (L) 05/31/2023 0716   ALBUMIN 3.7 (L) 05/30/2020 1646   AST 18 05/28/2023 1243   ALT 18 05/28/2023 1243   ALKPHOS 97 05/28/2023 1243   BILITOT 0.7 05/28/2023 1243   BILITOT 0.3 05/30/2020 1646   GFR 85.57 03/11/2012 1606   GFRNONAA 15 (L) 05/31/2023 0716   GFRNONAA 83 02/01/2012 1028    GFR Estimated Creatinine Clearance: 22.3 mL/min (A) (by C-G formula based on SCr of 4.19 mg/dL (H)). Recent Labs    05/28/23 1243  LIPASE 19   No results for input(s): "CKTOTAL", "CKMB", "CKMBINDEX", "TROPONINI" in the last 72 hours. Invalid input(s): "POCBNP" No results for input(s): "DDIMER" in the last 72 hours. No results for input(s): "HGBA1C" in the last 72 hours. No results for input(s): "CHOL", "HDL", "LDLCALC", "TRIG", "CHOLHDL", "LDLDIRECT" in the last 72 hours. Recent Labs    05/28/23 1748  TSH 0.771   Recent Labs    05/28/23 1748  VITAMINB12 411   Coags: No results for input(s): "INR" in the last 72 hours.  Invalid input(s): "PT" Microbiology: Recent Results (from the past 240 hour(s))  Resp panel by RT-PCR (RSV, Flu A&B, Covid) Anterior Nasal Swab     Status: None   Collection Time: 05/28/23 12:43 PM   Specimen: Anterior Nasal Swab  Result Value Ref Range Status   SARS Coronavirus 2 by RT PCR NEGATIVE NEGATIVE Final   Influenza A by PCR NEGATIVE NEGATIVE Final   Influenza B by PCR NEGATIVE NEGATIVE Final    Comment: (NOTE) The Xpert Xpress SARS-CoV-2/FLU/RSV plus assay is intended as an aid in the diagnosis of influenza from Nasopharyngeal swab specimens and should not be used as a sole basis for treatment. Nasal washings  and aspirates are unacceptable for Xpert Xpress SARS-CoV-2/FLU/RSV testing.  Fact Sheet for Patients: BloggerCourse.com  Fact Sheet for Healthcare Providers: SeriousBroker.it  This test is not yet approved or cleared by the [place] FDA and has been authorized for detection and/or diagnosis of SARS-CoV-2 by FDA under an Emergency Use Authorization (EUA). This EUA will remain in effect (meaning this test can be used) for the duration of the COVID-19 declaration under Section 564(b)(1) of the Act, 21 U.S.C. section 360bbb-3(b)(1), unless the authorization is terminated or revoked.     Resp Syncytial Virus by PCR NEGATIVE NEGATIVE Final    Comment: (NOTE) Fact Sheet for Patients: BloggerCourse.com  Fact Sheet for Healthcare Providers: SeriousBroker.it  This test is not yet approved or cleared by the [place] FDA and has been authorized for detection and/or diagnosis of SARS-CoV-2 by FDA under an Emergency Use Authorization (EUA). This EUA will remain in effect (meaning this test can be used) for the duration of the COVID-19 declaration under Section 564(b)(1) of the Act, 21 U.S.C. section 360bbb-3(b)(1), unless the authorization is terminated or revoked.  Performed at [facility], [address] [place], [place] [address]   Urine Culture     Status: None   Collection Time: 05/28/23 12:43 PM   Specimen: Urine, Catheterized  Result Value Ref Range Status   Specimen Description URINE, CATHETERIZED  Final   Special Requests NONE  Final   Culture   Final    NO GROWTH Performed at [facility], [address], [place], [place] [address]    Report Status 05/30/2023 FINAL  Final  Culture, blood (routine x 2)     Status: None (Preliminary result)   Collection  Time: 05/28/23  1:00 PM   Specimen: BLOOD  Result Value Ref Range Status   Specimen Description  BLOOD SITE NOT SPECIFIED  Final   Special Requests   Final    BOTTLES DRAWN AEROBIC AND ANAEROBIC Blood Culture adequate volume   Culture   Final    NO GROWTH 2 DAYS Performed at [facility], [address], [place], [place] [address]    Report Status PENDING  Incomplete  Culture, blood (routine x 2)     Status: None (Preliminary result)   Collection Time: 05/28/23  1:53 PM   Specimen: BLOOD RIGHT ARM  Result Value Ref Range Status   Specimen Description BLOOD RIGHT ARM  Final   Special Requests   Final    BOTTLES DRAWN AEROBIC ONLY Blood Culture adequate volume   Culture   Final    NO GROWTH 2 DAYS Performed at [facility], [address] [place], [place] [address]    Report Status PENDING  Incomplete    FURTHER DISCHARGE INSTRUCTIONS:  Get Medicines reviewed and adjusted: Please take all your medications with you for your next visit with your Primary MD  Laboratory/radiological data: Please request your Primary MD to go over all hospital tests and procedure/radiological results at the follow up, please ask your Primary MD to get all Hospital records sent to his/her office.  In some cases, they will be blood work, cultures and biopsy results pending at the time of your discharge. Please request that your primary care M.D. goes through all the records of your hospital data and follows up on these results.  Also Note the following: If you experience worsening of your admission symptoms, develop shortness of breath, life threatening emergency, suicidal or homicidal thoughts you must seek medical attention immediately by calling 911 or calling your MD immediately  if symptoms less severe.  You must read complete instructions/literature along with all the possible adverse reactions/side effects for all the Medicines you take and that have been prescribed to you. Take any new Medicines after you have completely understood and accpet all the possible adverse reactions/side  effects.   Do not drive when taking Pain medications or sleeping medications (Benzodaizepines)  Do not take more than prescribed Pain, Sleep and Anxiety Medications. It is not advisable to combine anxiety,sleep and pain medications without talking with your primary care practitioner  Special Instructions: If you have smoked or chewed Tobacco  in the last 2 yrs please stop smoking, stop any regular Alcohol  and or any Recreational drug use.  Wear Seat belts while driving.  Please note: You were cared for by a hospitalist during your hospital stay. Once you are discharged, your primary care physician will handle any further medical issues. Please note that NO REFILLS for any discharge medications will be authorized once you are discharged, as it is imperative that you return to your primary care physician (or establish a relationship with a primary care physician if you do not have one) for your post hospital discharge needs so that they can reassess your need for medications and monitor your lab values.  Total Time spent coordinating discharge including counseling, education and face to face time equals greater than 30 minutes.  SignedJeoffrey Massed 05/31/2023 9:11 AM

## 2023-05-31 NOTE — Progress Notes (Signed)
[facility] Progress Note  Subjective: seen in room, had HD yest w/ another 3 L UF.   Vitals:   05/31/23 0439 05/31/23 0825 05/31/23 0826 05/31/23 1123  BP: 102/62   (!) 167/57  Pulse:  76    Resp: 13   18  Temp: 98.7 F (37.1 C)   99 F (37.2 C)  TempSrc: Oral Oral Oral Oral  SpO2:      Weight:        Exam: Gen alert, much better No jvd or bruits Chest clear bilat to bases RRR no MRG Abd soft ntnd no mass or ascites +bs MS bilat BKA Ext 1+ bilat LE > UE edema Neuro improved    L AVF+bruit    Renal-related home meds: - norvasc 10mg  daily - gabapentin 100 tid - hydralazine 50 tid - imdur 60 daily - labetalol 200 bid - midodrine 10mg  daily prn sbp <100    OP HD: [facility]   4h  450 /800  107.9kg  2/2 bath  L AVF  Heparin none - last OP HD 9/14, post wt 115.7kg - venofer 100 mg three times per week thru 10/05 - hectorol 1 mcg IV three times per week - mircera 75 mcg q 2 wks, last 9/14, due 9/28 - came off 3-7kg over most of the last 3 wks      CXR 9/17 - IMPRESSION: Patchy bibasilar airspace disease greatest at the right lung base, which could reflect pneumonia given clinical presentation     Assessment/ Plan: Sepsis - w/ fevers, Reina Fuse, AMS. Cx's sent and IV abx started per pmd. CXR w/ poss PNA. For possible dc today back to SNF.  Hyperkalemia - resolved  Acute hypoxic resp failure/ vol overload/ poss pna - getting IV abx and O2 support. Had HD x 2 here w/ 6 L UF total. 2 kg up after HD yest.  ESRD - on HD TTS. Missed 1 HD last week. Had HD here x 2. For dc today.  HTN - BP's low on admission, better/ wnl now. Home meds on hold.  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 Follow.  MBD ckd - CCa in range. Follow.  Chronic pain - on fentanyl patch and percocet at home.  AMS - multifactorial PAF - on eliquis and amiodarone      Vinson Moselle MD  [facility] 05/31/2023, 11:48 AM  Recent Labs  Lab 05/28/23 1243 05/28/23 1317 05/30/23 0621  05/31/23 0716  HGB 10.3*   < > 9.0* 10.3*  ALBUMIN 2.7*  --   --  2.6*  CALCIUM 8.2*   < > 7.7* 8.1*  PHOS  --   --   --  3.5  CREATININE 7.32*   < > 5.42* 4.19*  K 6.7*   < > 4.1 3.8   < > = values in this interval not displayed.   No results for input(s): "IRON", "TIBC", "FERRITIN" in the last 168 hours. Inpatient medications:  amiodarone  200 mg Oral Daily   amLODipine  10 mg Oral Daily   apixaban  5 mg Oral BID   atorvastatin  40 mg Oral QHS   Chlorhexidine Gluconate Cloth  6 each Topical Q0600   Chlorhexidine Gluconate Cloth  6 each Topical Q0600   docusate sodium  100 mg Oral BID   doxercalciferol  1 mcg Intravenous Q T,Th,Sa-HD   doxycycline  100 mg Oral Q12H   famotidine  20 mg Oral QODAY   fentaNYL  1  patch Transdermal Q72H   folic acid  1 mg Oral Daily   gabapentin  100 mg Oral TID   hydrALAZINE  50 mg Oral Q8H   icosapent Ethyl  1 g Oral QODAY   insulin aspart  0-6 Units Subcutaneous TID WC   [START ON 06/01/2023] isosorbide mononitrate  60 mg Oral Daily   labetalol  100 mg Oral BID   loratadine  10 mg Oral Daily   metoCLOPramide  5 mg Oral TID AC & HS   multivitamin  1 tablet Oral QHS   nicotine  21 mg Transdermal QHS   rOPINIRole  2 mg Oral QHS   senna-docusate  2 tablet Oral QHS   sodium chloride flush  3 mL Intravenous Q12H   tamsulosin  0.4 mg Oral Daily    sodium chloride     cefTRIAXone (ROCEPHIN)  IV 2 g (05/31/23 1025)   sodium chloride, acetaminophen **OR** acetaminophen, midodrine, ondansetron **OR** ondansetron (ZOFRAN) IV, oxyCODONE-acetaminophen, sodium chloride flush

## 2023-06-02 LAB — CULTURE, BLOOD (ROUTINE X 2)
Culture: NO GROWTH
Culture: NO GROWTH
Special Requests: ADEQUATE
Special Requests: ADEQUATE

## 2023-06-03 DIAGNOSIS — J690 Pneumonitis due to inhalation of food and vomit: Secondary | ICD-10-CM | POA: Diagnosis not present

## 2023-06-03 DIAGNOSIS — G9341 Metabolic encephalopathy: Secondary | ICD-10-CM | POA: Diagnosis not present

## 2023-06-03 NOTE — Consult Note (Signed)
[facility]) Accountable Care Organization [facility]) [facility] Liaison Note  06/03/2023  [name] [birth year] [id number]  Covering for Charlesetta Shanks, RN (Liaison for [facility])  Location: [facility] Liaison screened the patient remotely at [facility].  Insurance: [employer]   [name] is a 67 y.o. male who is a Primary Care Patient of Lonie Peak, Cordelia Poche. The patient was screened for readmission hospitalization with noted extreme risk score for unplanned readmission risk with 2 IP/1 ED in 6 months.  The patient was assessed for potential [facility]) Care Management service needs for post hospital transition for care coordination. Review of patient's electronic medical record reveals patient was admitted for Sepsis. Pt discharged to SNF and the facility will address pt's ongoing needs.  Plan: Pt discharged to a non-affiliated facility and will not be followed by the PAC-RN with [facility].  [facility] Care Management/Population Health does not replace or interfere with any arrangements made by the Inpatient Transition of Care team.  For questions contact:   Elliot Cousin, RN, [facility] Liaison [place]   Population Health Office Hours MTWF  8:00 am-6:00 pm [phone] mobile [phone] [Office toll free line] Office Hours are M-F 8:30 - 5 pm [email or web address]

## 2023-06-05 DIAGNOSIS — N186 End stage renal disease: Secondary | ICD-10-CM | POA: Diagnosis not present

## 2023-06-05 DIAGNOSIS — R52 Pain, unspecified: Secondary | ICD-10-CM | POA: Diagnosis not present

## 2023-06-05 DIAGNOSIS — Z5181 Encounter for therapeutic drug level monitoring: Secondary | ICD-10-CM | POA: Diagnosis not present

## 2023-06-05 DIAGNOSIS — Z515 Encounter for palliative care: Secondary | ICD-10-CM | POA: Diagnosis not present

## 2023-06-06 DIAGNOSIS — Z515 Encounter for palliative care: Secondary | ICD-10-CM | POA: Diagnosis not present

## 2023-06-10 DIAGNOSIS — E1122 Type 2 diabetes mellitus with diabetic chronic kidney disease: Secondary | ICD-10-CM | POA: Diagnosis not present

## 2023-06-10 DIAGNOSIS — Z794 Long term (current) use of insulin: Secondary | ICD-10-CM | POA: Diagnosis not present

## 2023-06-10 DIAGNOSIS — E113511 Type 2 diabetes mellitus with proliferative diabetic retinopathy with macular edema, right eye: Secondary | ICD-10-CM | POA: Diagnosis not present

## 2023-06-10 DIAGNOSIS — E113512 Type 2 diabetes mellitus with proliferative diabetic retinopathy with macular edema, left eye: Secondary | ICD-10-CM | POA: Diagnosis not present

## 2023-06-10 DIAGNOSIS — Z992 Dependence on renal dialysis: Secondary | ICD-10-CM | POA: Diagnosis not present

## 2023-06-10 DIAGNOSIS — N186 End stage renal disease: Secondary | ICD-10-CM | POA: Diagnosis not present

## 2023-06-13 DIAGNOSIS — Z91158 Patient's noncompliance with renal dialysis for other reason: Secondary | ICD-10-CM | POA: Diagnosis not present

## 2023-06-13 DIAGNOSIS — N186 End stage renal disease: Secondary | ICD-10-CM | POA: Diagnosis not present

## 2023-06-13 DIAGNOSIS — Z992 Dependence on renal dialysis: Secondary | ICD-10-CM | POA: Diagnosis not present

## 2023-06-20 DIAGNOSIS — M25561 Pain in right knee: Secondary | ICD-10-CM | POA: Diagnosis not present

## 2023-06-20 DIAGNOSIS — M25562 Pain in left knee: Secondary | ICD-10-CM | POA: Diagnosis not present

## 2023-06-21 DIAGNOSIS — Z89511 Acquired absence of right leg below knee: Secondary | ICD-10-CM | POA: Diagnosis not present

## 2023-06-21 DIAGNOSIS — Z89512 Acquired absence of left leg below knee: Secondary | ICD-10-CM | POA: Diagnosis not present

## 2023-07-02 DIAGNOSIS — Z515 Encounter for palliative care: Secondary | ICD-10-CM | POA: Diagnosis not present

## 2023-07-05 DIAGNOSIS — N186 End stage renal disease: Secondary | ICD-10-CM | POA: Diagnosis not present

## 2023-07-05 DIAGNOSIS — E1165 Type 2 diabetes mellitus with hyperglycemia: Secondary | ICD-10-CM | POA: Diagnosis not present

## 2023-07-05 DIAGNOSIS — J449 Chronic obstructive pulmonary disease, unspecified: Secondary | ICD-10-CM | POA: Diagnosis not present

## 2023-07-05 DIAGNOSIS — Z992 Dependence on renal dialysis: Secondary | ICD-10-CM | POA: Diagnosis not present

## 2023-07-11 DIAGNOSIS — Z992 Dependence on renal dialysis: Secondary | ICD-10-CM | POA: Diagnosis not present

## 2023-07-11 DIAGNOSIS — N186 End stage renal disease: Secondary | ICD-10-CM | POA: Diagnosis not present

## 2023-07-11 DIAGNOSIS — Z91158 Patient's noncompliance with renal dialysis for other reason: Secondary | ICD-10-CM | POA: Diagnosis not present

## 2023-07-11 DIAGNOSIS — E1122 Type 2 diabetes mellitus with diabetic chronic kidney disease: Secondary | ICD-10-CM | POA: Diagnosis not present

## 2023-07-12 DIAGNOSIS — Y835 Amputation of limb(s) as the cause of abnormal reaction of the patient, or of later complication, without mention of misadventure at the time of the procedure: Secondary | ICD-10-CM | POA: Diagnosis not present

## 2023-07-12 DIAGNOSIS — Z741 Need for assistance with personal care: Secondary | ICD-10-CM | POA: Diagnosis not present

## 2023-07-12 DIAGNOSIS — Z89512 Acquired absence of left leg below knee: Secondary | ICD-10-CM | POA: Diagnosis not present

## 2023-07-12 DIAGNOSIS — M6281 Muscle weakness (generalized): Secondary | ICD-10-CM | POA: Diagnosis not present

## 2023-07-12 DIAGNOSIS — Z89511 Acquired absence of right leg below knee: Secondary | ICD-10-CM | POA: Diagnosis not present

## 2023-07-12 DIAGNOSIS — Z993 Dependence on wheelchair: Secondary | ICD-10-CM | POA: Diagnosis not present

## 2023-07-15 DIAGNOSIS — Z89512 Acquired absence of left leg below knee: Secondary | ICD-10-CM | POA: Diagnosis not present

## 2023-07-15 DIAGNOSIS — M6281 Muscle weakness (generalized): Secondary | ICD-10-CM | POA: Diagnosis not present

## 2023-07-15 DIAGNOSIS — Z993 Dependence on wheelchair: Secondary | ICD-10-CM | POA: Diagnosis not present

## 2023-07-15 DIAGNOSIS — Y835 Amputation of limb(s) as the cause of abnormal reaction of the patient, or of later complication, without mention of misadventure at the time of the procedure: Secondary | ICD-10-CM | POA: Diagnosis not present

## 2023-07-15 DIAGNOSIS — Z741 Need for assistance with personal care: Secondary | ICD-10-CM | POA: Diagnosis not present

## 2023-07-15 DIAGNOSIS — Z89511 Acquired absence of right leg below knee: Secondary | ICD-10-CM | POA: Diagnosis not present

## 2023-07-16 DIAGNOSIS — Z741 Need for assistance with personal care: Secondary | ICD-10-CM | POA: Diagnosis not present

## 2023-07-16 DIAGNOSIS — Z89512 Acquired absence of left leg below knee: Secondary | ICD-10-CM | POA: Diagnosis not present

## 2023-07-16 DIAGNOSIS — Y835 Amputation of limb(s) as the cause of abnormal reaction of the patient, or of later complication, without mention of misadventure at the time of the procedure: Secondary | ICD-10-CM | POA: Diagnosis not present

## 2023-07-16 DIAGNOSIS — Z89511 Acquired absence of right leg below knee: Secondary | ICD-10-CM | POA: Diagnosis not present

## 2023-07-16 DIAGNOSIS — M6281 Muscle weakness (generalized): Secondary | ICD-10-CM | POA: Diagnosis not present

## 2023-07-16 DIAGNOSIS — Z993 Dependence on wheelchair: Secondary | ICD-10-CM | POA: Diagnosis not present

## 2023-07-17 ENCOUNTER — Telehealth: Payer: Self-pay | Admitting: Orthopedic Surgery

## 2023-07-17 DIAGNOSIS — Y835 Amputation of limb(s) as the cause of abnormal reaction of the patient, or of later complication, without mention of misadventure at the time of the procedure: Secondary | ICD-10-CM | POA: Diagnosis not present

## 2023-07-17 DIAGNOSIS — Z89511 Acquired absence of right leg below knee: Secondary | ICD-10-CM | POA: Diagnosis not present

## 2023-07-17 DIAGNOSIS — M6281 Muscle weakness (generalized): Secondary | ICD-10-CM | POA: Diagnosis not present

## 2023-07-17 DIAGNOSIS — Z741 Need for assistance with personal care: Secondary | ICD-10-CM | POA: Diagnosis not present

## 2023-07-17 DIAGNOSIS — Z89512 Acquired absence of left leg below knee: Secondary | ICD-10-CM | POA: Diagnosis not present

## 2023-07-17 DIAGNOSIS — Z993 Dependence on wheelchair: Secondary | ICD-10-CM | POA: Diagnosis not present

## 2023-07-17 NOTE — Telephone Encounter (Signed)
Rx written and faxed to [facility]

## 2023-07-17 NOTE — Telephone Encounter (Signed)
Pt called in asking if we can call [facility] and request the grey shrinkers for him please advise Trey Paula at [facility] should know what this is about

## 2023-07-18 ENCOUNTER — Emergency Department (HOSPITAL_COMMUNITY): Payer: Medicare HMO

## 2023-07-18 ENCOUNTER — Encounter (HOSPITAL_COMMUNITY): Payer: Self-pay

## 2023-07-18 ENCOUNTER — Other Ambulatory Visit: Payer: Self-pay

## 2023-07-18 ENCOUNTER — Observation Stay (HOSPITAL_COMMUNITY)
Admission: EM | Admit: 2023-07-18 | Discharge: 2023-07-20 | Disposition: A | Discharge status: Skilled Nursing Facility | Payer: Medicare HMO | Attending: Emergency Medicine | Admitting: Emergency Medicine

## 2023-07-18 DIAGNOSIS — Z743 Need for continuous supervision: Secondary | ICD-10-CM | POA: Diagnosis not present

## 2023-07-18 DIAGNOSIS — G9340 Encephalopathy, unspecified: Principal | ICD-10-CM | POA: Diagnosis present

## 2023-07-18 DIAGNOSIS — Z992 Dependence on renal dialysis: Secondary | ICD-10-CM | POA: Diagnosis not present

## 2023-07-18 DIAGNOSIS — Z89511 Acquired absence of right leg below knee: Secondary | ICD-10-CM | POA: Diagnosis not present

## 2023-07-18 DIAGNOSIS — R110 Nausea: Secondary | ICD-10-CM | POA: Diagnosis not present

## 2023-07-18 DIAGNOSIS — A419 Sepsis, unspecified organism: Secondary | ICD-10-CM | POA: Insufficient documentation

## 2023-07-18 DIAGNOSIS — I120 Hypertensive chronic kidney disease with stage 5 chronic kidney disease or end stage renal disease: Secondary | ICD-10-CM | POA: Insufficient documentation

## 2023-07-18 DIAGNOSIS — E875 Hyperkalemia: Secondary | ICD-10-CM

## 2023-07-18 DIAGNOSIS — I2510 Atherosclerotic heart disease of native coronary artery without angina pectoris: Secondary | ICD-10-CM | POA: Diagnosis not present

## 2023-07-18 DIAGNOSIS — Z91040 Latex allergy status: Secondary | ICD-10-CM | POA: Insufficient documentation

## 2023-07-18 DIAGNOSIS — Z7901 Long term (current) use of anticoagulants: Secondary | ICD-10-CM

## 2023-07-18 DIAGNOSIS — J449 Chronic obstructive pulmonary disease, unspecified: Secondary | ICD-10-CM | POA: Diagnosis present

## 2023-07-18 DIAGNOSIS — I6782 Cerebral ischemia: Secondary | ICD-10-CM | POA: Diagnosis not present

## 2023-07-18 DIAGNOSIS — Z1152 Encounter for screening for COVID-19: Secondary | ICD-10-CM | POA: Diagnosis not present

## 2023-07-18 DIAGNOSIS — R918 Other nonspecific abnormal finding of lung field: Secondary | ICD-10-CM | POA: Diagnosis not present

## 2023-07-18 DIAGNOSIS — R0989 Other specified symptoms and signs involving the circulatory and respiratory systems: Secondary | ICD-10-CM | POA: Diagnosis not present

## 2023-07-18 DIAGNOSIS — R4182 Altered mental status, unspecified: Secondary | ICD-10-CM | POA: Diagnosis present

## 2023-07-18 DIAGNOSIS — G9341 Metabolic encephalopathy: Secondary | ICD-10-CM | POA: Diagnosis not present

## 2023-07-18 DIAGNOSIS — I480 Paroxysmal atrial fibrillation: Secondary | ICD-10-CM | POA: Diagnosis not present

## 2023-07-18 DIAGNOSIS — Z96653 Presence of artificial knee joint, bilateral: Secondary | ICD-10-CM | POA: Insufficient documentation

## 2023-07-18 DIAGNOSIS — Z79899 Other long term (current) drug therapy: Secondary | ICD-10-CM | POA: Insufficient documentation

## 2023-07-18 DIAGNOSIS — I739 Peripheral vascular disease, unspecified: Secondary | ICD-10-CM | POA: Diagnosis present

## 2023-07-18 DIAGNOSIS — R0902 Hypoxemia: Secondary | ICD-10-CM | POA: Diagnosis not present

## 2023-07-18 DIAGNOSIS — N281 Cyst of kidney, acquired: Secondary | ICD-10-CM | POA: Diagnosis not present

## 2023-07-18 DIAGNOSIS — E119 Type 2 diabetes mellitus without complications: Secondary | ICD-10-CM

## 2023-07-18 DIAGNOSIS — R531 Weakness: Secondary | ICD-10-CM | POA: Diagnosis not present

## 2023-07-18 DIAGNOSIS — E1122 Type 2 diabetes mellitus with diabetic chronic kidney disease: Secondary | ICD-10-CM | POA: Insufficient documentation

## 2023-07-18 DIAGNOSIS — N186 End stage renal disease: Secondary | ICD-10-CM

## 2023-07-18 DIAGNOSIS — R404 Transient alteration of awareness: Secondary | ICD-10-CM | POA: Diagnosis not present

## 2023-07-18 DIAGNOSIS — I959 Hypotension, unspecified: Secondary | ICD-10-CM | POA: Diagnosis not present

## 2023-07-18 LAB — RENAL FUNCTION PANEL
Albumin: 2.8 g/dL — ABNORMAL LOW (ref 3.5–5.0)
Albumin: 3.1 g/dL — ABNORMAL LOW (ref 3.5–5.0)
Anion gap: 13 (ref 5–15)
Anion gap: 16 — ABNORMAL HIGH (ref 5–15)
BUN: 75 mg/dL — ABNORMAL HIGH (ref 8–23)
BUN: 41 mg/dL — ABNORMAL HIGH (ref 8–23)
CO2: 19 mmol/L — ABNORMAL LOW (ref 22–32)
CO2: 23 mmol/L (ref 22–32)
Calcium: 7.6 mg/dL — ABNORMAL LOW (ref 8.9–10.3)
Calcium: 7.8 mg/dL — ABNORMAL LOW (ref 8.9–10.3)
Chloride: 100 mmol/L (ref 98–111)
Chloride: 95 mmol/L — ABNORMAL LOW (ref 98–111)
Creatinine, Ser: 9.41 mg/dL — ABNORMAL HIGH (ref 0.61–1.24)
Creatinine, Ser: 6.1 mg/dL — ABNORMAL HIGH (ref 0.61–1.24)
GFR, Estimated: 6 mL/min — ABNORMAL LOW (ref >60)
GFR, Estimated: 9 mL/min — ABNORMAL LOW (ref >60)
Glucose, Bld: 140 mg/dL — ABNORMAL HIGH (ref 70–99)
Glucose, Bld: 111 mg/dL — ABNORMAL HIGH (ref 70–99)
Phosphorus: 6.6 mg/dL — ABNORMAL HIGH (ref 2.5–4.6)
Phosphorus: 4.1 mg/dL (ref 2.5–4.6)
Potassium: 5.3 mmol/L — ABNORMAL HIGH (ref 3.5–5.1)
Potassium: 4 mmol/L (ref 3.5–5.1)
Sodium: 132 mmol/L — ABNORMAL LOW (ref 135–145)
Sodium: 134 mmol/L — ABNORMAL LOW (ref 135–145)

## 2023-07-18 LAB — CBC WITH DIFFERENTIAL/PLATELET
Abs Immature Granulocytes: 0.05 10*3/uL (ref 0.00–0.07)
Basophils Absolute: 0.1 10*3/uL (ref 0.0–0.1)
Basophils Relative: 1 %
Eosinophils Absolute: 0 10*3/uL (ref 0.0–0.5)
Eosinophils Relative: 0 %
HCT: 32.8 % — ABNORMAL LOW (ref 39.0–52.0)
Hemoglobin: 10.7 g/dL — ABNORMAL LOW (ref 13.0–17.0)
Immature Granulocytes: 1 %
Lymphocytes Relative: 2 %
Lymphs Abs: 0.2 10*3/uL — ABNORMAL LOW (ref 0.7–4.0)
MCH: 30.3 pg (ref 26.0–34.0)
MCHC: 32.6 g/dL (ref 30.0–36.0)
MCV: 92.9 fL (ref 80.0–100.0)
Monocytes Absolute: 0.4 10*3/uL (ref 0.1–1.0)
Monocytes Relative: 4 %
Neutro Abs: 9.2 10*3/uL — ABNORMAL HIGH (ref 1.7–7.7)
Neutrophils Relative %: 92 %
Platelets: 142 10*3/uL — ABNORMAL LOW (ref 150–400)
RBC: 3.53 MIL/uL — ABNORMAL LOW (ref 4.22–5.81)
RDW: 13.7 % (ref 11.5–15.5)
WBC: 9.9 10*3/uL (ref 4.0–10.5)
nRBC: 0 % (ref 0.0–0.2)

## 2023-07-18 LAB — CBC
HCT: 30.7 % — ABNORMAL LOW (ref 39.0–52.0)
Hemoglobin: 10.1 g/dL — ABNORMAL LOW (ref 13.0–17.0)
MCH: 30.9 pg (ref 26.0–34.0)
MCHC: 32.9 g/dL (ref 30.0–36.0)
MCV: 93.9 fL (ref 80.0–100.0)
Platelets: 114 10*3/uL — ABNORMAL LOW (ref 150–400)
RBC: 3.27 MIL/uL — ABNORMAL LOW (ref 4.22–5.81)
RDW: 13.7 % (ref 11.5–15.5)
WBC: 7.3 10*3/uL (ref 4.0–10.5)
nRBC: 0 % (ref 0.0–0.2)

## 2023-07-18 LAB — AMMONIA: Ammonia: 26 umol/L (ref 9–35)

## 2023-07-18 LAB — I-STAT VENOUS BLOOD GAS, ED
Acid-base deficit: 4 mmol/L — ABNORMAL HIGH (ref 0.0–2.0)
Bicarbonate: 20.1 mmol/L (ref 20.0–28.0)
Calcium, Ion: 0.9 mmol/L — ABNORMAL LOW (ref 1.15–1.40)
HCT: 33 % — ABNORMAL LOW (ref 39.0–52.0)
Hemoglobin: 11.2 g/dL — ABNORMAL LOW (ref 13.0–17.0)
O2 Saturation: 87 %
Potassium: 6.5 mmol/L (ref 3.5–5.1)
Sodium: 132 mmol/L — ABNORMAL LOW (ref 135–145)
TCO2: 21 mmol/L — ABNORMAL LOW (ref 22–32)
pCO2, Ven: 31.3 mm[Hg] — ABNORMAL LOW (ref 44–60)
pH, Ven: 7.415 (ref 7.25–7.43)
pO2, Ven: 51 mm[Hg] — ABNORMAL HIGH (ref 32–45)

## 2023-07-18 LAB — HEPATITIS B SURFACE ANTIGEN: Hepatitis B Surface Ag: NONREACTIVE

## 2023-07-18 LAB — COMPREHENSIVE METABOLIC PANEL
ALT: 27 U/L (ref 0–44)
AST: 36 U/L (ref 15–41)
Albumin: 3.1 g/dL — ABNORMAL LOW (ref 3.5–5.0)
Alkaline Phosphatase: 96 U/L (ref 38–126)
Anion gap: 12 (ref 5–15)
BUN: 75 mg/dL — ABNORMAL HIGH (ref 8–23)
CO2: 20 mmol/L — ABNORMAL LOW (ref 22–32)
Calcium: 7.7 mg/dL — ABNORMAL LOW (ref 8.9–10.3)
Chloride: 101 mmol/L (ref 98–111)
Creatinine, Ser: 9.52 mg/dL — ABNORMAL HIGH (ref 0.61–1.24)
GFR, Estimated: 5 mL/min — ABNORMAL LOW (ref >60)
Glucose, Bld: 142 mg/dL — ABNORMAL HIGH (ref 70–99)
Potassium: 6.6 mmol/L (ref 3.5–5.1)
Sodium: 133 mmol/L — ABNORMAL LOW (ref 135–145)
Total Bilirubin: 0.7 mg/dL (ref <1.2)
Total Protein: 6.4 g/dL — ABNORMAL LOW (ref 6.5–8.1)

## 2023-07-18 LAB — RESP PANEL BY RT-PCR (RSV, FLU A&B, COVID)  RVPGX2
Influenza A by PCR: NEGATIVE
Influenza B by PCR: NEGATIVE
Resp Syncytial Virus by PCR: NEGATIVE
SARS Coronavirus 2 by RT PCR: NEGATIVE

## 2023-07-18 LAB — APTT: aPTT: 31 s (ref 24–36)

## 2023-07-18 LAB — PROTIME-INR
INR: 1.4 — ABNORMAL HIGH (ref 0.8–1.2)
Prothrombin Time: 17.3 s — ABNORMAL HIGH (ref 11.4–15.2)

## 2023-07-18 LAB — GLUCOSE, CAPILLARY
Glucose-Capillary: 98 mg/dL (ref 70–99)
Glucose-Capillary: 126 mg/dL — ABNORMAL HIGH (ref 70–99)

## 2023-07-18 LAB — I-STAT CG4 LACTIC ACID, ED
Lactic Acid, Venous: 1.4 mmol/L (ref 0.5–1.9)
Lactic Acid, Venous: 0.8 mmol/L (ref 0.5–1.9)

## 2023-07-18 LAB — CBG MONITORING, ED: Glucose-Capillary: 148 mg/dL — ABNORMAL HIGH (ref 70–99)

## 2023-07-18 MED ORDER — SODIUM ZIRCONIUM CYCLOSILICATE 10 G PO PACK
10.0000 g | PACK | Freq: Once | ORAL | Status: AC
  Administered 2023-07-18: 10 g via ORAL
  Filled 2023-07-18: qty 1

## 2023-07-18 MED ORDER — APIXABAN 5 MG PO TABS
5.0000 mg | ORAL_TABLET | Freq: Two times a day (BID) | ORAL | Status: DC
  Administered 2023-07-18 – 2023-07-20 (×4): 5 mg via ORAL
  Filled 2023-07-18 (×4): qty 1

## 2023-07-18 MED ORDER — SODIUM CHLORIDE 0.9 % IV SOLN
1.0000 g | INTRAVENOUS | Status: DC
  Administered 2023-07-18: 1 g via INTRAVENOUS
  Filled 2023-07-18 (×2): qty 10

## 2023-07-18 MED ORDER — AMIODARONE HCL 200 MG PO TABS
200.0000 mg | ORAL_TABLET | Freq: Every day | ORAL | Status: DC
  Administered 2023-07-18 – 2023-07-20 (×3): 200 mg via ORAL
  Filled 2023-07-18 (×3): qty 1

## 2023-07-18 MED ORDER — INSULIN ASPART 100 UNIT/ML IJ SOLN
0.0000 [IU] | Freq: Every day | INTRAMUSCULAR | Status: DC
Start: 2023-07-18 — End: 2023-07-21
  Administered 2023-07-19: 2 [IU] via SUBCUTANEOUS

## 2023-07-18 MED ORDER — HEPARIN SODIUM (PORCINE) 1000 UNIT/ML DIALYSIS
1000.0000 [IU] | INTRAMUSCULAR | Status: DC | PRN
Start: 2023-07-18 — End: 2023-07-18

## 2023-07-18 MED ORDER — METRONIDAZOLE 500 MG PO TABS
500.0000 mg | ORAL_TABLET | Freq: Two times a day (BID) | ORAL | Status: DC
  Administered 2023-07-18 – 2023-07-19 (×2): 500 mg via ORAL
  Filled 2023-07-18 (×2): qty 1

## 2023-07-18 MED ORDER — AMLODIPINE BESYLATE 10 MG PO TABS
10.0000 mg | ORAL_TABLET | Freq: Every day | ORAL | Status: DC
  Administered 2023-07-18 – 2023-07-20 (×3): 10 mg via ORAL
  Filled 2023-07-18 (×3): qty 1

## 2023-07-18 MED ORDER — SODIUM CHLORIDE 0.9 % IV SOLN
2.0000 g | Freq: Once | INTRAVENOUS | Status: AC
  Administered 2023-07-18: 2 g via INTRAVENOUS
  Filled 2023-07-18: qty 12.5

## 2023-07-18 MED ORDER — PENTAFLUOROPROP-TETRAFLUOROETH EX AERO: 1.0000 | INHALATION_SPRAY | CUTANEOUS | Status: DC | PRN

## 2023-07-18 MED ORDER — LIDOCAINE-PRILOCAINE 2.5-2.5 % EX CREA: 1.0000 | TOPICAL_CREAM | CUTANEOUS | Status: DC | PRN

## 2023-07-18 MED ORDER — METRONIDAZOLE 500 MG/100ML IV SOLN
500.0000 mg | Freq: Once | INTRAVENOUS | Status: AC
Start: 2023-07-18 — End: 2023-07-18
  Administered 2023-07-18: 500 mg via INTRAVENOUS
  Filled 2023-07-18: qty 100

## 2023-07-18 MED ORDER — ACETAMINOPHEN 650 MG RE SUPP: 650.0000 mg | Freq: Four times a day (QID) | RECTAL | Status: DC | PRN

## 2023-07-18 MED ORDER — POLYETHYLENE GLYCOL 3350 17 G PO PACK: 17.0000 g | PACK | Freq: Every day | ORAL | Status: DC | PRN

## 2023-07-18 MED ORDER — LIDOCAINE 4 % EX CREA
TOPICAL_CREAM | Freq: Three times a day (TID) | CUTANEOUS | Status: DC | PRN
  Filled 2023-07-18: qty 5

## 2023-07-18 MED ORDER — TAMSULOSIN HCL 0.4 MG PO CAPS
0.4000 mg | ORAL_CAPSULE | Freq: Every day | ORAL | Status: DC
  Administered 2023-07-18 – 2023-07-20 (×3): 0.4 mg via ORAL
  Filled 2023-07-18 (×3): qty 1

## 2023-07-18 MED ORDER — ANTICOAGULANT SODIUM CITRATE 4% (200MG/5ML) IV SOLN: 5.0000 mL | Status: DC | PRN

## 2023-07-18 MED ORDER — CHLORHEXIDINE GLUCONATE CLOTH 2 % EX PADS
6.0000 | MEDICATED_PAD | Freq: Every day | CUTANEOUS | Status: DC
Start: 2023-07-19 — End: 2023-07-21
  Administered 2023-07-19 – 2023-07-20 (×2): 6 via TOPICAL

## 2023-07-18 MED ORDER — ROPINIROLE HCL 1 MG PO TABS: 2.0000 mg | ORAL_TABLET | Freq: Every day | ORAL | Status: DC

## 2023-07-18 MED ORDER — VANCOMYCIN HCL IN DEXTROSE 1-5 GM/200ML-% IV SOLN
1000.0000 mg | Freq: Once | INTRAVENOUS | Status: DC
Start: 2023-07-18 — End: 2023-07-18

## 2023-07-18 MED ORDER — ACETAMINOPHEN 325 MG PO TABS: 650.0000 mg | ORAL_TABLET | Freq: Four times a day (QID) | ORAL | Status: DC | PRN

## 2023-07-18 MED ORDER — LIDOCAINE HCL (PF) 1 % IJ SOLN: 5.0000 mL | INTRAMUSCULAR | Status: DC | PRN

## 2023-07-18 MED ORDER — HEPARIN SODIUM (PORCINE) 1000 UNIT/ML DIALYSIS
20.0000 [IU]/kg | INTRAMUSCULAR | Status: DC | PRN
  Administered 2023-07-18: 2200 [IU] via INTRAVENOUS_CENTRAL
  Filled 2023-07-18 (×2): qty 3

## 2023-07-18 MED ORDER — SODIUM CHLORIDE 0.9% FLUSH
10.0000 mL | Freq: Two times a day (BID) | INTRAVENOUS | Status: DC
  Administered 2023-07-18 – 2023-07-20 (×5): 10 mL via INTRAVENOUS

## 2023-07-18 MED ORDER — VANCOMYCIN HCL IN DEXTROSE 1-5 GM/200ML-% IV SOLN
1000.0000 mg | Freq: Once | INTRAVENOUS | Status: AC
  Administered 2023-07-18: 1000 mg via INTRAVENOUS
  Filled 2023-07-18: qty 200

## 2023-07-18 MED ORDER — ATORVASTATIN CALCIUM 40 MG PO TABS
40.0000 mg | ORAL_TABLET | Freq: Every day | ORAL | Status: DC
  Administered 2023-07-18 – 2023-07-20 (×3): 40 mg via ORAL
  Filled 2023-07-18 (×3): qty 1

## 2023-07-18 MED ORDER — CALCIUM GLUCONATE-NACL 1-0.675 GM/50ML-% IV SOLN
1.0000 g | Freq: Once | INTRAVENOUS | Status: AC
Start: 2023-07-18 — End: 2023-07-18
  Administered 2023-07-18: 1000 mg via INTRAVENOUS
  Filled 2023-07-18: qty 50

## 2023-07-18 MED ORDER — ALTEPLASE 2 MG IJ SOLR: 2.0000 mg | Freq: Once | INTRAMUSCULAR | Status: DC | PRN

## 2023-07-18 MED ORDER — GERHARDT'S BUTT CREAM
TOPICAL_CREAM | Freq: Three times a day (TID) | CUTANEOUS | Status: DC | PRN
  Filled 2023-07-18: qty 1

## 2023-07-18 MED ORDER — SODIUM CHLORIDE 0.9 % IV BOLUS (SEPSIS)
500.0000 mL | Freq: Once | INTRAVENOUS | Status: AC
Start: 2023-07-18 — End: 2023-07-18
  Administered 2023-07-18: 500 mL via INTRAVENOUS

## 2023-07-18 MED ORDER — FENTANYL 50 MCG/HR TD PT72
1.0000 | MEDICATED_PATCH | TRANSDERMAL | Status: DC
  Administered 2023-07-18: 1 via TRANSDERMAL
  Filled 2023-07-18: qty 1

## 2023-07-18 MED ORDER — VANCOMYCIN HCL IN DEXTROSE 1-5 GM/200ML-% IV SOLN
1000.0000 mg | INTRAVENOUS | Status: DC
  Administered 2023-07-18: 1000 mg via INTRAVENOUS
  Filled 2023-07-18: qty 200

## 2023-07-18 MED ORDER — METRONIDAZOLE 500 MG/100ML IV SOLN: 500.0000 mg | Freq: Two times a day (BID) | INTRAVENOUS | Status: DC

## 2023-07-18 MED ORDER — INSULIN ASPART 100 UNIT/ML IJ SOLN
0.0000 [IU] | Freq: Three times a day (TID) | INTRAMUSCULAR | Status: DC
Start: 2023-07-19 — End: 2023-07-21
  Administered 2023-07-19: 2 [IU] via SUBCUTANEOUS
  Administered 2023-07-19: 1 [IU] via SUBCUTANEOUS
  Administered 2023-07-19 – 2023-07-20 (×3): 2 [IU] via SUBCUTANEOUS
  Administered 2023-07-20: 1 [IU] via SUBCUTANEOUS

## 2023-07-18 MED ORDER — ACETAMINOPHEN 500 MG PO TABS
1000.0000 mg | ORAL_TABLET | Freq: Once | ORAL | Status: AC
  Administered 2023-07-18: 1000 mg via ORAL
  Filled 2023-07-18: qty 2

## 2023-07-18 MED ORDER — FAMOTIDINE 20 MG PO TABS
20.0000 mg | ORAL_TABLET | Freq: Every day | ORAL | Status: DC
Start: 2023-07-18 — End: 2023-07-21
  Administered 2023-07-18 – 2023-07-20 (×3): 20 mg via ORAL
  Filled 2023-07-18 (×3): qty 1

## 2023-07-18 MED ORDER — HEPARIN SODIUM (PORCINE) 5000 UNIT/ML IJ SOLN
5000.0000 [IU] | Freq: Three times a day (TID) | INTRAMUSCULAR | Status: DC
  Filled 2023-07-18: qty 1

## 2023-07-18 MED ORDER — LIDOCAINE 5 % EX PTCH: 1.0000 | MEDICATED_PATCH | Freq: Every day | CUTANEOUS | Status: DC | PRN

## 2023-07-18 NOTE — ED Notes (Signed)
Fentanyl patch that was on patient arrival removed shortly after arrival. Wasted with RN Windle Guard

## 2023-07-18 NOTE — Hospital Course (Addendum)
[name] is a 67 y.o. male with pertinent PMH of ESRD on TTS HD, osteomyelitis, and recent treatment for Sepsis due to aspiration pneumonia who presented to [facility] ED on 11/7 with altered mental status and was admitted for acute encephalopathy, uremia, and hyperkalemia.   Acute Encephalopathy Uremia vs Gabapentin Toxicity This patient typically misses dialysis once per week due to fecal incontinence. He two of his previous three scheduled sessions. His encephalopathy improved after urgent dialysis on admission day and appeared fully resolved on his first morning. Initially was code sepsis due to AMS and fever to 100.6. Broad abx were started: vanc, cefepime, metro. Thereafter he was afebrile, exhibited no hypotension, no leukocytosis. Favored encephalopathy due to uremia vs gabapentin toxicity so stopped empiric antibiotics. Pt had complained of weakness, nausea, and edema for a few weeks, favoring a progressive uremia. Pt was kept in hospital for one additional night for observation after stopping antibiotics. At discharge, recommend stopping gabapentin. Solutions to improve patient comfort at dialysis re incontinence may make him more compliant. - Cranial imaging negative. - Held gabapentin - Blood cultures without growth - Brief Vancomycin, Cefepime, Metronidazole stopped after improvement - Held unnecessary sedating medicines   Hyperkalemia ESRD on TTS HD K 6.6 at admission and patient sent to urgent dialysis. BUN 75 improved to 49, baseline 40. Cr 9.53 improved to 6.8, baseline around 5. No EKG changes. No chest pain. Likely due to missed dialysis and corrected there.  Urgent dialysis on Thursday and routine on Saturday.   COPD without exacerbation Pt with wheezes on exam. No hypoxia or dyspnea. No home meds for this. We gave him duonebs. Consider maintenance inhaler going forward.   Feeling well today. BM yesterday no NV, CP, SOB. No fevers chills. Feels back to baseline. Will get dialysis  today and then will go back to [facility] where he stays. Patient voiced understanding of the plan.

## 2023-07-18 NOTE — Progress Notes (Signed)
Received patient in bed to unit.  Alert and oriented.  Informed consent signed and in chart.   TX duration:3.5  Patient tolerated well.  Transported back to the room  Alert, without acute distress.  Hand-off given to patient's nurse.   Access used: right AVF Access issues: none  Total UF removed: 3L Medication(s) given: none    07/18/23 1737  Vitals  Temp 98.8 F (37.1 C)  Temp Source Oral  BP (!) 194/60  MAP (mmHg) 99  BP Location Right Arm  BP Method Automatic  Patient Position (if appropriate) Lying  Pulse Rate 76  Pulse Rate Source Monitor  ECG Heart Rate 76  Resp 18  Oxygen Therapy  SpO2 96 %  O2 Device Room Air  During Treatment Monitoring  Blood Flow Rate (mL/min) 399 mL/min  Arterial Pressure (mmHg) -8.68 mmHg  Venous Pressure (mmHg) 225.64 mmHg  TMP (mmHg) 13.73 mmHg  Ultrafiltration Rate (mL/min) 1023 mL/min  Dialysate Flow Rate (mL/min) 300 ml/min  Duration of HD Treatment -hour(s) 3.45 hour(s)  Cumulative Fluid Removed (mL) per Treatment  2954.05  HD Safety Checks Performed Yes  Intra-Hemodialysis Comments Tx completed;Tolerated well  Dialysis Fluid Bolus Normal Saline  Bolus Amount (mL) 300 mL      Lenor Provencher [facility]

## 2023-07-18 NOTE — Consult Note (Addendum)
[facility] Renal Consultation Note    Indication for Consultation:  Management of ESRD/hemodialysis, anemia, hypertension/volume, and secondary hyperparathyroidism.  HPI: [name] is a 67 y.o. male with PMH including ERD on dialysis, COPD, DM, HTN, and osteomyelitis who presented to the ED with reports of nausea. He resides in a SNF.  He was discharged from [facility] on 05/31/23 with severer sepsis 2/2 aspiration pneumonia and acute metabolic encephalopathy. Patient awakens with voice but is a poor historian. He denies fever, SOB, CP, dizziness. He does endorse weakness and generally "feeling bad." States he missed dialysis. Upon review of outside records, appears he missed HD on 10/31, attended on 11/2, then missed 11/5 and today. Dialysis notes state he has been complaining of weakness and edema for a couple weeks and has been missing HD often. He has notable asterixis on exam, he reports he always has it but is not a reliable historian at present. Admission labs notable for K+ 6.6, BUN 75, Cr 9.53, Ca 7.7, Alb 3.1, Hgb 10.7, WBC 9.9. BP elevated, HR in 60s-70s on exam. Temp was 100.6 and sepsis protocol was started. He is currently receiving cefepime and vancomycin. We attempted to reach his wife for further history/consent but she did not answer the phone. Patient does give verbal consent to HD. CXR and CT head/abdomen are pending.   Past Medical History:  Diagnosis Date   Carotid artery occlusion 11/10/2010   LEFT CAROTID ENDARTERECTOMY   Complication of anesthesia    BP WENT UP AT [facility] "   COPD (chronic obstructive pulmonary disease) (HCC)    pt denies this dx as of 06/01/20 - no inhaler    Diabetes mellitus without complication (HCC)    Diverticulitis    Diverticulosis of colon (without mention of hemorrhage)    DJD (degenerative joint disease)    knees/hands/feet/back/neck   ESRD (end stage renal disease) on dialysis (HCC)    Fatty liver    Full dentures    GERD  (gastroesophageal reflux disease)    H/O hiatal hernia    History of blood transfusion    with a past surical procedure per patient 06/01/20   Hyperlipidemia    Hypertension    Neuromuscular disorder (HCC)    peripheral neuropathy   Non-pressure chronic ulcer of other part of left foot limited to breakdown of skin (HCC) 11/12/2016   Osteomyelitis (HCC)    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 Atherectomy left popliteal artery July 2012.    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   Unstable angina (HCC) 09/16/2018   Wears glasses    Past Surgical History:  Procedure Laterality Date   ABDOMINAL AORTOGRAM W/LOWER EXTREMITY N/A 06/23/2021   Procedure: ABDOMINAL AORTOGRAM W/LOWER EXTREMITY;  Surgeon: Elder Negus, MD;  [facility];  Service: Cardiovascular;  Laterality: N/A;   AMPUTATION  11/05/2011   Procedure: AMPUTATION RAY;  Surgeon: Toni Arthurs, MD;  Location: [facility] OR;  Service: Orthopedics;  Laterality: Right;  Amputation of Right 4&5th Toes   AMPUTATION Left 11/26/2012   Procedure: AMPUTATION RAY;  Surgeon: Toni Arthurs, MD;  Location: [facility] OR;  Service: Orthopedics;  Laterality: Left;  fourth ray amputation   AMPUTATION Right 08/27/2014   Procedure: Transmetatarsal Amputation;  Surgeon: Aldean Baker  V, MD;  Location: [facility] OR;  Service: Orthopedics;  Laterality: Right;   AMPUTATION Right 01/14/2015   Procedure: AMPUTATION BELOW KNEE;  Surgeon: Nadara Mustard, MD;  Location: [facility] OR;  Service: Orthopedics;  Laterality: Right;   AMPUTATION Left 10/21/2015   Procedure: Left Foot 5th Ray Amputation;  Surgeon: Nadara Mustard, MD;  Location: [facility] OR;  Service: Orthopedics;  Laterality: Left;   AMPUTATION Left 07/04/2022   Procedure: LEFT BELOW KNEE AMPUTATION;  Surgeon: Nadara Mustard, MD;  Location: [facility]  OR;  Service: Orthopedics;  Laterality: Left;   ANTERIOR FUSION CERVICAL SPINE  02/06/06   C4-5, C5-6, C6-7; SURGEON DR. MAX COHEN   AV FISTULA PLACEMENT Left 06/02/2020   Procedure: ARTERIOVENOUS (AV) FISTULA CREATION LEFT;  Surgeon: Maeola Harman, MD;  Location: [facility] OR;  Service: Vascular;  Laterality: Left;   BACK SURGERY     x 3   BASCILIC VEIN TRANSPOSITION Left 07/21/2020   Procedure: LEFT UPPER ARM ATERIOVENOUS SUPERFISTULALIZATION;  Surgeon: Maeola Harman, MD;  Location: [facility] OR;  Service: Vascular;  Laterality: Left;   BELOW KNEE LEG AMPUTATION Right    CARDIAC CATHETERIZATION  10/31/04   2009   CAROTID ENDARTERECTOMY  11/10/10   CAROTID ENDARTERECTOMY Left 11/10/2010   Subtotal occlusion of left internal carotid artery with left hemispheric transient ischemic attacks.   CAROTID STENT     CARPAL TUNNEL RELEASE Right 10/21/2013   Procedure: RIGHT CARPAL TUNNEL RELEASE;  Surgeon: Nicki Reaper, MD;  Location: [facility];  Service: Orthopedics;  Laterality: Right;   CHOLECYSTECTOMY     COLON SURGERY     COLONOSCOPY     COLOSTOMY REVERSAL  05/21/2018   ileostomy reversal   CYSTOSCOPY WITH STENT PLACEMENT Bilateral 01/13/2018   Procedure: CYSTOSCOPY WITH BILATERAL URETERAL CATHETER PLACEMENT;  Surgeon: Crist Fat, MD;  Location: [facility] ORS;  Service: Urology;  Laterality: Bilateral;   ESOPHAGEAL MANOMETRY Bilateral 07/19/2014   Procedure: ESOPHAGEAL MANOMETRY (EM);  Surgeon: Beverley Fiedler, MD;  Location: [facility];  Service: Gastroenterology;  Laterality: Bilateral;   EYE SURGERY Bilateral 2020   cataract   FEMORAL ARTERY STENT     x6   FINGER SURGERY     FOOT SURGERY  04/25/2016    EXCISION BASE 5TH METATARSAL AND PARTIAL CUBOID LEFT FOOT   HERNIA REPAIR     LEFT INGUINAL AND UMBILICAL REPAIRS   HERNIA REPAIR     I & D EXTREMITY Left 04/25/2016   Procedure: EXCISION BASE 5TH METATARSAL AND PARTIAL CUBOID LEFT FOOT;  Surgeon: Nadara Mustard, MD;   [facility];  Service: Orthopedics;  Laterality: Left;   ILEOSTOMY  01/13/2018   Procedure: ILEOSTOMY;  Surgeon: Berna Bue, MD;  Location: [facility];  Service: General;;   ILEOSTOMY CLOSURE N/A 05/21/2018   Procedure: ILEOSTOMY REVERSAL ERAS PATHWAY;  Surgeon: Berna Bue, MD;  Location: [facility];  Service: General;  Laterality: N/A;   IR RADIOLOGIST EVAL & MGMT  11/19/2017   IR RADIOLOGIST EVAL & MGMT  12/03/2017   IR RADIOLOGIST EVAL & MGMT  12/18/2017   JOINT REPLACEMENT Right 2001   Total knee   LAMINECTOMY     X 3 LUMBAR AND X 2 CERVICAL SPINE OPERATIONS   LAPAROSCOPIC CHOLECYSTECTOMY W/ CHOLANGIOGRAPHY  11/09/04   SURGEON DR. Caleen Essex   LEFT HEART CATH AND CORONARY ANGIOGRAPHY N/A 09/16/2018   Procedure: LEFT HEART CATH AND CORONARY ANGIOGRAPHY;  Surgeon: Elder Negus, MD;  [facility];  Service: Cardiovascular;  Laterality: N/A;   LEFT HEART CATHETERIZATION WITH CORONARY ANGIOGRAM N/A 10/29/2014   Procedure: LEFT HEART CATHETERIZATION WITH CORONARY ANGIOGRAM;  Surgeon: Pamella Pert, MD;  Location: [facility] CATH LAB;  Service: Cardiovascular;  Laterality: N/A;   LIGATION OF COMPETING BRANCHES OF ARTERIOVENOUS FISTULA Left 07/21/2020   Procedure: LIGATION OF COMPETING BRANCHES OF LEFT UPPER ARM ARTERIOVENOUS FISTULA;  Surgeon: Maeola Harman, MD;  Location: [facility] OR;  Service: Vascular;  Laterality: Left;   LOWER EXTREMITY ANGIOGRAM N/A 03/19/2012   Procedure: LOWER EXTREMITY ANGIOGRAM;  Surgeon: Kathleene Hazel, MD;  Location: [facility] CATH LAB;  Service: Cardiovascular;  Laterality: N/A;   LOWER EXTREMITY ANGIOGRAPHY N/A 06/20/2021   Procedure: LOWER EXTREMITY ANGIOGRAPHY;  Surgeon: Elder Negus, MD;  [facility];  Service: Cardiovascular;  Laterality: N/A;   NECK SURGERY     PARTIAL COLECTOMY N/A 01/13/2018   Procedure: LAPAROSCOPIC ASSISTED   SIGMOID COLECTOMY ILEOSTOMY;  Surgeon: Berna Bue, MD;  Location: [facility]S;   Service: General;  Laterality: N/A;   PENILE PROSTHESIS IMPLANT  08/14/05   INFRAPUBIC INSERTION OF INFLATABLE PENILE PROSTHESIS; SURGEON DR. Logan Bores   PENILE PROSTHESIS IMPLANT     PERCUTANEOUS CORONARY STENT INTERVENTION (PCI-S) Right 10/29/2014   Procedure: PERCUTANEOUS CORONARY STENT INTERVENTION (PCI-S);  Surgeon: Pamella Pert, MD;  Location: [facility] CATH LAB;  Service: Cardiovascular;  Laterality: Right;   PERIPHERAL VASCULAR INTERVENTION Left 06/23/2021   Procedure: PERIPHERAL VASCULAR INTERVENTION;  Surgeon: Elder Negus, MD;  [facility];  Service: Cardiovascular;  Laterality: Left;   REMOVAL OF PENILE PROSTHESIS N/A 06/14/2021   Procedure: Removal of THREE piece inflatable penile prosthesis;  Surgeon: Crista Elliot, MD;  Location: [facility] OR;  Service: Urology;  Laterality: N/A;   SHOULDER ARTHROSCOPY     SPINE SURGERY     TOE AMPUTATION Left    TONSILLECTOMY     TOTAL KNEE ARTHROPLASTY  07/2002   RIGHT KNEE ; SURGEON  DR. [name] ALSO HAD ARTHROSCOPIC RIGHT KNEE IN  10/2001   TOTAL KNEE ARTHROPLASTY     ULNAR NERVE TRANSPOSITION Right 10/21/2013   Procedure: RIGHT ELBOW  ULNAR NERVE DECOMPRESSION;  Surgeon: Nicki Reaper, MD;  Location: [facility];  Service: Orthopedics;  Laterality: Right;   Family History  Problem Relation Age of Onset   Heart disease Father        Before age 63-  CAD, BPG   Diabetes Father        Amputation   Cancer Father        PROSTATE   Hyperlipidemia Father    Hypertension Father    Heart attack Father        Triple BPG   Varicose Veins Father    Cancer Sister        Breast   Hyperlipidemia Sister    Hypertension Sister    Heart attack Brother    Colon cancer Brother    Diabetes Brother    Heart disease Brother 73       A-Fib. Before age 52   Hyperlipidemia Brother    Hypertension Brother    Hypertension Son    Arthritis Other        GRANDMOTHER   Hypertension Other        OTHER FAMILY MEMBERS    Social History:  reports that he has been smoking cigarettes. He has a 17.5 pack-year smoking history. He has never  used smokeless tobacco. He reports that he does not currently use alcohol. He reports that he does not use drugs.  ROS: As per HPI otherwise negative.  Physical Exam: Vitals:   07/18/23 1200 07/18/23 1215 07/18/23 1230 07/18/23 1307  BP: 138/61 (!) 131/59 (!) 155/64 (!) 163/81  Pulse: 67 66 69 70  Resp: 17 17 18 20   Temp:    98.1 F (36.7 C)  TempSrc:    Axillary  SpO2: 92% 93% 97% 98%  Weight:      Height:         General: Ill appearing male, awakens to voice but falls back asleep quickly. Head: Normocephalic, atraumatic, sclera non-icteric, mucus membranes are moist. Neck: Supple without lymphadenopathy/masses. JVD not elevated. Lungs: Diminished bases anteriorly, no wheezing/rhonchi/rales auscultated. Breathing is unlabored. Heart: RRR with normal S1, S2. No murmurs, rubs, or gallops appreciated. Abdomen: Soft, mildly distended, mild TTP RUQ. No rebound/guarding. No obvious abdominal masses. Musculoskeletal:  Strength and tone appear normal for age. Lower extremities: 1+ edema bilateral lower extremities Neuro:  Moves all extremities spontaneously. Dialysis Access: AVF + t/b  Allergies  Allergen Reactions   Contrast Media [Iodinated Contrast Media] Shortness Of Breath and Other (See Comments)    Difficulty breathing and altered mental status     Ivp Dye [Iodinated Contrast Media] Anaphylaxis, Shortness Of Breath and Other (See Comments)    Breathing problems, altered mental state    Adhesive [Tape] Rash and Other (See Comments)    Rash after 1 day of use Do not leave on longer then 3 days with out be changed   Latex Rash and Other (See Comments)    A severe rash appears after the first 24 hours of being placed   Prior to Admission medications   Medication Sig Start Date End Date Taking? Authorizing Provider  amiodarone (PACERONE) 200 MG tablet Take 1  tablet (200 mg total) by mouth daily. 10/29/22   Yates Decamp, MD  amLODipine (NORVASC) 10 MG tablet Take 10 mg by mouth daily.    [provider]  apixaban (ELIQUIS) 5 MG TABS tablet Take 1 tablet (5 mg total) by mouth 2 (two) times daily. 01/14/22 05/28/23  Almon Hercules, MD  atorvastatin (LIPITOR) 40 MG tablet Take 40 mg by mouth at bedtime.     [provider]  B Complex-C-Zn-Folic Acid (DIALYVITE/ZINC) TABS Take 1 tablet by mouth Every Tuesday,Thursday,and Saturday with dialysis. 10/04/21   [provider]  docusate sodium (COLACE) 100 MG capsule Take 100 mg by mouth 2 (two) times daily.    [provider]  famotidine (PEPCID) 20 MG tablet Take 20 mg by mouth every other day.    [provider]  fentaNYL (DURAGESIC) 25 MCG/HR Place 1 patch onto the skin every 3 (three) days. 06/01/23   Ghimire, Werner Lean, MD  folic acid (FOLVITE) 1 MG tablet Take 1 mg by mouth daily. 09/18/21   [provider]  gabapentin (NEURONTIN) 100 MG capsule Take 100 mg by mouth 3 (three) times daily.    [provider]  hydrALAZINE (APRESOLINE) 50 MG tablet Take 1 tablet (50 mg total) by mouth every 8 (eight) hours. 04/01/23   [name], MD  Icosapent Ethyl 0.5 g CAPS Take 1 capsule by mouth daily.    [provider]  insulin aspart (NOVOLOG FLEXPEN) 100 UNIT/ML FlexPen Inject 0-12 Units into the skin See admin instructions. Inject 4 units into the skin with meals in addition to sliding scale as needed.  Inject as per sliding scale: If 0 - 69 = 0 initiate hypoglycemia orders;  70 - 150 = 0;  151 - 200 = 2;  201 - 250 = 4;  251 - 300 = 6;  301 - 350 = 8;  351 - 400 = 10;  401 - 450 = 12.  If >400, give 12 units, recheck in 1 hour. If still >400, call MD/NP for further orders. Subcutaneously with meals in addition to scheduled 4 unites with meals.    [provider]  isosorbide mononitrate (IMDUR) 60 MG 24 hr tablet Take 1 tablet (60 mg total)  by mouth daily. 01/21/23 05/28/23  Yates Decamp, MD  labetalol (NORMODYNE) 200 MG tablet Take 1 tablet (200 mg total) by mouth 2 (two) times daily. 04/01/23   [name], MD  lidocaine-prilocaine (EMLA) cream Apply 1 application topically See admin instructions. Prior to Dialysis days Tuesday,Thursday and saturday 06/06/21   [provider]  loratadine (CLARITIN) 10 MG tablet Take 10 mg by mouth daily.    [provider]  metoCLOPramide (REGLAN) 5 MG tablet Take 1 tablet (5 mg total) by mouth every 8 (eight) hours as needed for nausea. 05/31/23 11/27/23  Ghimire, Werner Lean, MD  midodrine (PROAMATINE) 10 MG tablet Take 10 mg by mouth daily as needed (SBP less than 100.).    [provider]  NEEDLE, REUSABLE, 22 G 22G X 1-1/2" MISC 1 Units by Does not apply route as directed. For B12 IM inj 02/15/18   Elgergawy, Leana Roe, MD  nitroGLYCERIN (NITROSTAT) 0.4 MG SL tablet Place 0.4 mg under the tongue every 5 (five) minutes as needed for chest pain. 01/06/22   [provider]  ondansetron (ZOFRAN) 4 MG tablet Take 1 tablet (4 mg total) by mouth every 8 (eight) hours as needed for nausea or vomiting. 04/24/23   Yates Decamp, MD  oxyCODONE-acetaminophen (PERCOCET/ROXICET) 5-325 MG tablet Take 1 tablet by mouth every 8 (eight) hours as needed for severe pain. 05/31/23   Ghimire, Werner Lean, MD  Polyvinyl Alcohol-Povidone PF 1.4-0.6 % SOLN Place 1 drop into both eyes 2 (two) times daily as needed (dry eyes).     [provider]  rOPINIRole (REQUIP) 2 MG tablet Take 2 mg by mouth at bedtime.    [provider]  senna-docusate (SENOKOT-S) 8.6-50 MG tablet Take 2 tablets by mouth at bedtime.    [provider]  Syringe/Needle, Disp, (SYRINGE 3CC/22GX1-1/2") 22G X 1-1/2" 3 ML MISC 1 Syringe by Does not apply route as directed. For b12 IM inj 02/15/18   Elgergawy, Leana Roe, MD  tamsulosin (FLOMAX) 0.4 MG CAPS capsule Take 0.4 mg by mouth daily.    [provider]  Zinc Oxide (DESITIN EX) Apply topically See admin instructions. Apply to buttocks topically 2 times a day for dry skin.    [provider]   Current Facility-Administered Medications  Medication Dose Route Frequency Provider Last Rate Last Admin   alteplase (CATHFLO ACTIVASE) injection 2 mg  2 mg Intracatheter Once PRN Maxie Barb, MD       anticoagulant sodium citrate solution 5 mL  5 mL Intracatheter PRN Maxie Barb, MD       [START ON 07/19/2023] Chlorhexidine Gluconate Cloth 2 % PADS 6 each  6 each Topical Q0600 Maxie Barb, MD       heparin injection 1,000 Units  1,000 Units Intracatheter PRN Maxie Barb, MD       heparin  injection 2,200 Units  20 Units/kg Dialysis PRN Maxie Barb, MD       lidocaine (PF) (XYLOCAINE) 1 % injection 5 mL  5 mL Intradermal PRN Maxie Barb, MD       lidocaine-prilocaine (EMLA) cream 1 Application  1 Application Topical PRN Maxie Barb, MD       pentafluoroprop-tetrafluoroeth Peggye Pitt) aerosol 1 Application  1 Application Topical PRN Maxie Barb, MD       sodium chloride flush (NS) 0.9 % injection 10 mL  10 mL Intravenous Q12H Ford, Kelsey N, PA-C   10 mL at 07/18/23 1109   Current Outpatient Medications  Medication Sig Dispense Refill   amiodarone (PACERONE) 200 MG tablet Take 1 tablet (200 mg total) by mouth daily. 100 tablet 1   amLODipine (NORVASC) 10 MG tablet Take 10 mg by mouth daily.     apixaban (ELIQUIS) 5 MG TABS tablet Take 1 tablet (5 mg total) by mouth 2 (two) times daily. 60 tablet 1   atorvastatin (LIPITOR) 40 MG tablet Take 40 mg by mouth at bedtime.      B Complex-C-Zn-Folic Acid (DIALYVITE/ZINC) TABS Take 1 tablet by mouth Every Tuesday,Thursday,and Saturday with dialysis.     docusate sodium (COLACE) 100 MG capsule Take 100 mg by mouth 2 (two) times daily.     famotidine (PEPCID) 20 MG tablet Take 20 mg by mouth every other day.      fentaNYL (DURAGESIC) 25 MCG/HR Place 1 patch onto the skin every 3 (three) days. 3 patch 0   folic acid (FOLVITE) 1 MG tablet Take 1 mg by mouth daily.     gabapentin (NEURONTIN) 100 MG capsule Take 100 mg by mouth 3 (three) times daily.     hydrALAZINE (APRESOLINE) 50 MG tablet Take 1 tablet (50 mg total) by mouth every 8 (eight) hours.     Icosapent Ethyl 0.5 g CAPS Take 1 capsule by mouth daily.     insulin aspart (NOVOLOG FLEXPEN) 100 UNIT/ML FlexPen Inject 0-12 Units into the skin See admin instructions. Inject 4 units into the skin with meals in addition to sliding scale as needed. Inject as per sliding scale: If 0 - 69 = 0 initiate hypoglycemia orders;  70 - 150 = 0;  151 - 200 = 2;  201 - 250 = 4;  251 - 300 = 6;  301 - 350 = 8;  351 - 400 = 10;  401 - 450 = 12.  If >400, give 12 units, recheck in 1 hour. If still >400, call MD/NP for further orders. Subcutaneously with meals in addition to scheduled 4 unites with meals.     isosorbide mononitrate (IMDUR) 60 MG 24 hr tablet Take 1 tablet (60 mg total) by mouth daily. 90 tablet 1   labetalol (NORMODYNE) 200 MG tablet Take 1 tablet (200 mg total) by mouth 2 (two) times daily.     lidocaine-prilocaine (EMLA) cream Apply 1 application topically See admin instructions. Prior to Dialysis days Tuesday,Thursday and saturday     loratadine (CLARITIN) 10 MG tablet Take 10 mg by mouth daily.     metoCLOPramide (REGLAN) 5 MG tablet Take 1 tablet (5 mg total) by mouth every 8 (eight) hours as needed for nausea.     midodrine (PROAMATINE) 10 MG tablet Take 10 mg by mouth daily as needed (SBP less than 100.).     NEEDLE, REUSABLE, 22 G 22G X 1-1/2" MISC 1 Units by Does not apply route as directed. For  B12 IM inj 10 each 0   nitroGLYCERIN (NITROSTAT) 0.4 MG SL tablet Place 0.4 mg under the tongue every 5 (five) minutes as needed for chest pain.     ondansetron (ZOFRAN) 4 MG tablet Take 1 tablet (4 mg total) by mouth every 8 (eight) hours as needed for  nausea or vomiting. 60 tablet 0   oxyCODONE-acetaminophen (PERCOCET/ROXICET) 5-325 MG tablet Take 1 tablet by mouth every 8 (eight) hours as needed for severe pain. 15 tablet 0   Polyvinyl Alcohol-Povidone PF 1.4-0.6 % SOLN Place 1 drop into both eyes 2 (two) times daily as needed (dry eyes).      rOPINIRole (REQUIP) 2 MG tablet Take 2 mg by mouth at bedtime.     senna-docusate (SENOKOT-S) 8.6-50 MG tablet Take 2 tablets by mouth at bedtime.     Syringe/Needle, Disp, (SYRINGE 3CC/22GX1-1/2") 22G X 1-1/2" 3 ML MISC 1 Syringe by Does not apply route as directed. For b12 IM inj 10 each 0   tamsulosin (FLOMAX) 0.4 MG CAPS capsule Take 0.4 mg by mouth daily.     Zinc Oxide (DESITIN EX) Apply topically See admin instructions. Apply to buttocks topically 2 times a day for dry skin.     Labs: Basic Metabolic Panel: Recent Labs  Lab 07/18/23 1111 07/18/23 1118  NA 133* 132*  K 6.6* 6.5*  CL 101  --   CO2 20*  --   GLUCOSE 142*  --   BUN 75*  --   CREATININE 9.52*  --   CALCIUM 7.7*  --    Liver Function Tests: Recent Labs  Lab 07/18/23 1111  AST 36  ALT 27  ALKPHOS 96  BILITOT 0.7  PROT 6.4*  ALBUMIN 3.1*   No results for input(s): "LIPASE", "AMYLASE" in the last 168 hours. Recent Labs  Lab 07/18/23 1111  AMMONIA 26   CBC: Recent Labs  Lab 07/18/23 1111 07/18/23 1118  WBC 9.9  --   NEUTROABS 9.2*  --   HGB 10.7* 11.2*  HCT 32.8* 33.0*  MCV 92.9  --   PLT 142*  --    Cardiac Enzymes: No results for input(s): "CKTOTAL", "CKMB", "CKMBINDEX", "TROPONINI" in the last 168 hours. CBG: Recent Labs  Lab 07/18/23 1027  GLUCAP 148*   Iron Studies: No results for input(s): "IRON", "TIBC", "TRANSFERRIN", "FERRITIN" in the last 72 hours. Studies/Results: No results found.  Dialysis Orders: Center: [facility]  on TTS . 180NRe 4 hours 15 min BFR 450 DFR 800 EDW 107.4kg 2K2Ca AVF 15g No heparin Venofer 100mg  IV q HD- first dose given 07/13/23 Hectorol IV q  HD  Assessment/Plan:  Encephalopathy: Patient with AMS and reported nausea and weakness over the past several weeks. He was febrile on arrival and sepsis protocol was started. May also be a component of uremia, missed two dialysis sessions recently. Hyperkalemia: K+ 6.6 due to missed HD. Urgent HD today, dialysis unit is aware Asterixis: Likely due to missed HD +/- gabapentin. Would recommend holding gabapentin.   ESRD:  TTS schedule but poor compliance lately due to feeling weak. HD today, continue TTS schedule  Hypertension/volume: BP moderately elevated. Some edema on exam, CXR is pending but does not appear SOB. UFG 2-3L today.   Anemia: Hgb 11.2, no ESA indicated at this time. Hold iron due to sepsis work up  Metabolic bone disease: Calcium slightly low, monitor, may need higher K+ bath. Will check phos and resume binders once he is tolerating PO  T2DM: Management per  primary team  Rogers Blocker, PA-C 07/18/2023, 1:10 PM  [facility] Pager: [phone]  Nephrology attending: Seen and examined at Dialysis unit. Chart reviewed and I agree with above.  ESRD on HD, missed HD, presented from SNF for AMS, sepsis, likely uremic.  Labs with hyperkalemia. HD now. D/w primary team.   [name],  CKA

## 2023-07-18 NOTE — Progress Notes (Signed)
Transport went to bring pt do dialysis and was told the patient has to wait 30 minutes to come to dialysis after his CT

## 2023-07-18 NOTE — Progress Notes (Signed)
ED Pharmacy Antibiotic Sign Off An antibiotic consult was received from an ED provider for vancomycin and cefepime per pharmacy dosing for sepsis. A chart review was completed to assess appropriateness.   The following one time order(s) were placed:  Vancomycin 2000mg   Cefepime 2g   Further antibiotic and/or antibiotic pharmacy consults should be ordered by the admitting provider if indicated.   Thank you for allowing pharmacy to be a part of this patient's care.   Estill Batten, PharmD, BCCCP  Clinical Pharmacist 07/18/23 11:07 AM

## 2023-07-18 NOTE — ED Notes (Addendum)
Patient unable to sign dialysis consent due to AMS. Verbal consent given to [employer] PA.

## 2023-07-18 NOTE — Progress Notes (Signed)
Pharmacy Antibiotic Note  [name] is a 67 y.o. male admitted on 07/18/2023 with sepsis, unclear source.  Pharmacy has been consulted for vancomycin and cefepime dosing. Patient is on HD TTS PTA. Patient is also on metronidazole.   Note: he is had similar episodes in the past when he had septic pneumonia and UTI. He missed dialysis Tuesday (two days ago) and last Thursday and typically refuses it once per week because he has bowel incontinence that is exacerbated at dialysis.   CT and CXR unremarkable for infection. WBC wnl, afebrile. No recent positive cultures in epic. Patient is s/p HD 11/7 in the hospital. He received vancomycin load before HD.   Plan:  Vancomycin 1000mg  post HD TTS  Cefepime 1g q24hr Metronidazole per team Monitor cultures, clinical status, HD schedule, vancomycin level Narrow abx as able and f/u duration    Height: 6\' 2"  (188 cm) Weight:  (uta on stretcher) IBW/kg (Calculated) : 82.2  Temp (24hrs), Avg:99.3 F (37.4 C), Min:98.1 F (36.7 C), Max:100.6 F (38.1 C)  Recent Labs  Lab 07/18/23 1111 07/18/23 1124 07/18/23 1254 07/18/23 1353  WBC 9.9  --   --  7.3  CREATININE 9.52*  --   --  9.41*  LATICACIDVEN  --  1.4 0.8  --     Estimated Creatinine Clearance: 9.7 mL/min (A) (by C-G formula based on SCr of 9.41 mg/dL (H)).    Allergies  Allergen Reactions   Contrast Media [Iodinated Contrast Media] Shortness Of Breath and Other (See Comments)    Difficulty breathing and altered mental status     Ivp Dye [Iodinated Contrast Media] Anaphylaxis, Shortness Of Breath and Other (See Comments)    Breathing problems, altered mental state    Latex Rash and Other (See Comments)    A severe rash appears after the first 24 hours of being placed   Tape Other (See Comments) and Rash    Rash after 1 day of use  Do not leave on longer then 3 days with out be changed    Antimicrobials this admission: vanc 11/7 >>  Cefe 11/7 >> MTZ  11/7 >>   Dose  adjustments this admission: N/a  Microbiology results: 11/7 BCx:    Thank you for allowing pharmacy to be a part of this patient's care.  Alphia Moh, PharmD, BCPS, BCCP Clinical Pharmacist  Please check AMION for all [facility] phone numbers After 10:00 PM, call Main Pharmacy [phone]

## 2023-07-18 NOTE — Sepsis Progress Note (Signed)
eLink is following this Code Sepsis. °

## 2023-07-18 NOTE — H&P (Addendum)
Date: 07/18/2023               Patient Name:  [name] MRN: [id number]  DOB: [birth year] Age / Sex: 67 y.o., male   PCP: Lonie Peak, PA-C         Medical Service: Internal Medicine Teaching Service         Attending Physician: Dr. Dickie La, MD      First Contact: Dr. Katheran James, DO Pager [phone]    Second Contact: Dr. Modena Slater, DO Pager [phone]         After Hours (After 5p/  First Contact Pager: [phone]  weekends / holidays): Second Contact Pager: [phone]   SUBJECTIVE   Chief Complaint: Altered Mental Status  History of Present Illness:  Mr [name] is a 67 yo M with PMH ESRD on TTS HD, PAD, CAD, COPD, HTN, osteomyelitis who presented to [facility] ED via EMS on 11/7 due to altered mental status. 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  This morning, facility staff found him to be somnolent, confused, and shaking in his wheelchair.  He was unable to follow commands and was not oriented to self, location, or time.  Although forehead temperature was measured at 97.7 the patient was felt to be warm by nursing staff and he was sent to the ED.  Staff reports that he is had similar episodes in the past when he had septic pneumonia and UTI.  Over the previous days, his only complaint to his wife and his caregivers were neck pain that he has experienced chronically and knee pain that was evaluated with an x-ray at at [facility].  He has not complained of cough, fever, chills, abdominal pain, N/V, or dysuria.  History provided by nursing caregivers and wife as pt's is limited due to AMS.  Upon questioning, Mr Vi makes no acute complaints and reports he is in the hospital because he "moved his wheelchair the wrong way" this morning.  Pt has had multiple recent hospitalizations for hospitalizations resulting in similar alterations in consciousness. He missed  dialysis Tuesday (two days ago) and last Thursday and typically refuses it once per week because he has bowel incontinence that is exacerbated at dialysis. He does have a sacral wound that is known to his nursing facility and was recently told it had improved.  ED Course: He arrived to [facility] ED via EMS midmorning on 11/7. He was found to be somnolent but arousable, answering questions appropriately, and complaining of Nausea. Hemodynamics stable including BP, pulse. Respiratory rate 22 and febrile 100.6. Code sepsis was initiated. On examination he was somnolent, Aox3, rhonchi in lungs. Hyperkalemic to 6.6.  Past Medical History Past Medical History:  Diagnosis Date   Carotid artery occlusion 11/10/2010   LEFT CAROTID ENDARTERECTOMY   Complication of anesthesia    BP WENT UP AT [facility] "   COPD (chronic obstructive pulmonary disease) (HCC)    pt denies this dx as of 06/01/20 - no inhaler    Diabetes mellitus without complication (HCC)    Diverticulitis    Diverticulosis of colon (without mention of hemorrhage)    DJD (degenerative joint disease)    knees/hands/feet/back/neck   ESRD (end stage renal disease) on dialysis (HCC)    Fatty liver    Full dentures    GERD (gastroesophageal reflux disease)    H/O hiatal hernia  History of blood transfusion    with a past surical procedure per patient 06/01/20   Hyperlipidemia    Hypertension    Neuromuscular disorder (HCC)    peripheral neuropathy   Non-pressure chronic ulcer of other part of left foot limited to breakdown of skin (HCC) 11/12/2016   Osteomyelitis (HCC)    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 Atherectomy left popliteal artery July 2012.    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   Unstable angina (HCC) 09/16/2018   Wears glasses      Meds:   Amiodarone 200 mg daily Amlodipine 10 mg daily Atorvastatin 40 mg daily Fentanyl patch 72-hour 50 mcg/h Folic acid 1 mg daily Isosorbide mononitrate extended release 24-hour 60 mg daily Lidocaine cream Loratadine 10 mg daily Pepcid 20 mg daily B complex vitamin and folic acid 1 tablet by mouth Tuesday, Thursday, Saturday Ropinirole 2 mg nightly Senna docusate 8.6-50 mg 2 tablets nightly Tamsulosin 0.4 mg daily Apixaban 5 mg twice daily Colace 100 mg twice daily Labetalol 200 mg twice daily Gabapentin 100 mg 3 times daily Hydralazine 50 mg every 8 hours NovoLog 4 units with meals NovoLog sliding scale Metoclopramide 5 mg every 8 hours Midodrine 10 mg prn Nitroglycerin tablets for chest pain Zofran 4 mg every 8 hours Oxycodone-acetaminophen 5-325 mg every 8 hours Icosapent Ethyl 0.6 grams  Past Surgical History Past Surgical History:  Procedure Laterality Date   ABDOMINAL AORTOGRAM W/LOWER EXTREMITY N/A 06/23/2021   Procedure: ABDOMINAL AORTOGRAM W/LOWER EXTREMITY;  Surgeon: Elder Negus, MD;  [facility];  Service: Cardiovascular;  Laterality: N/A;   AMPUTATION  11/05/2011   Procedure: AMPUTATION RAY;  Surgeon: Toni Arthurs, MD;  Location: [facility] OR;  Service: Orthopedics;  Laterality: Right;  Amputation of Right 4&5th Toes   AMPUTATION Left 11/26/2012   Procedure: AMPUTATION RAY;  Surgeon: Toni Arthurs, MD;  Location: [facility] OR;  Service: Orthopedics;  Laterality: Left;  fourth ray amputation   AMPUTATION Right 08/27/2014   Procedure: Transmetatarsal Amputation;  Surgeon: Nadara Mustard, MD;  Location: [facility] OR;  Service: Orthopedics;  Laterality: Right;   AMPUTATION Right 01/14/2015   Procedure: AMPUTATION BELOW KNEE;  Surgeon: Nadara Mustard, MD;  Location: [facility] OR;  Service: Orthopedics;  Laterality: Right;   AMPUTATION Left 10/21/2015   Procedure: Left Foot 5th Ray Amputation;  Surgeon: Nadara Mustard, MD;  Location: [facility] OR;  Service: Orthopedics;  Laterality: Left;    AMPUTATION Left 07/04/2022   Procedure: LEFT BELOW KNEE AMPUTATION;  Surgeon: Nadara Mustard, MD;  Location: [facility] OR;  Service: Orthopedics;  Laterality: Left;   ANTERIOR FUSION CERVICAL SPINE  02/06/06   C4-5, C5-6, C6-7; SURGEON DR. MAX COHEN   AV FISTULA PLACEMENT Left 06/02/2020   Procedure: ARTERIOVENOUS (AV) FISTULA CREATION LEFT;  Surgeon: Maeola Harman, MD;  Location: [facility] OR;  Service: Vascular;  Laterality: Left;   BACK SURGERY     x 3   BASCILIC VEIN TRANSPOSITION Left 07/21/2020   Procedure: LEFT UPPER ARM ATERIOVENOUS SUPERFISTULALIZATION;  Surgeon: Maeola Harman, MD;  Location: [facility] OR;  Service: Vascular;  Laterality: Left;   BELOW KNEE LEG AMPUTATION Right    CARDIAC CATHETERIZATION  10/31/04   2009   CAROTID ENDARTERECTOMY  11/10/10  CAROTID ENDARTERECTOMY Left 11/10/2010   Subtotal occlusion of left internal carotid artery with left hemispheric transient ischemic attacks.   CAROTID STENT     CARPAL TUNNEL RELEASE Right 10/21/2013   Procedure: RIGHT CARPAL TUNNEL RELEASE;  Surgeon: Nicki Reaper, MD;  Location: [facility];  Service: Orthopedics;  Laterality: Right;   CHOLECYSTECTOMY     COLON SURGERY     COLONOSCOPY     COLOSTOMY REVERSAL  05/21/2018   ileostomy reversal   CYSTOSCOPY WITH STENT PLACEMENT Bilateral 01/13/2018   Procedure: CYSTOSCOPY WITH BILATERAL URETERAL CATHETER PLACEMENT;  Surgeon: Crist Fat, MD;  Location: [facility] ORS;  Service: Urology;  Laterality: Bilateral;   ESOPHAGEAL MANOMETRY Bilateral 07/19/2014   Procedure: ESOPHAGEAL MANOMETRY (EM);  Surgeon: Beverley Fiedler, MD;  Location: [facility];  Service: Gastroenterology;  Laterality: Bilateral;   EYE SURGERY Bilateral 2020   cataract   FEMORAL ARTERY STENT     x6   FINGER SURGERY     FOOT SURGERY  04/25/2016    EXCISION BASE 5TH METATARSAL AND PARTIAL CUBOID LEFT FOOT   HERNIA REPAIR     LEFT INGUINAL AND UMBILICAL REPAIRS   HERNIA REPAIR     I & D EXTREMITY Left  04/25/2016   Procedure: EXCISION BASE 5TH METATARSAL AND PARTIAL CUBOID LEFT FOOT;  Surgeon: Nadara Mustard, MD;  Location: [facility];  Service: Orthopedics;  Laterality: Left;   ILEOSTOMY  01/13/2018   Procedure: ILEOSTOMY;  Surgeon: Berna Bue, MD;  Location: [facility];  Service: General;;   ILEOSTOMY CLOSURE N/A 05/21/2018   Procedure: ILEOSTOMY REVERSAL ERAS PATHWAY;  Surgeon: Berna Bue, MD;  Location: [facility];  Service: General;  Laterality: N/A;   IR RADIOLOGIST EVAL & MGMT  11/19/2017   IR RADIOLOGIST EVAL & MGMT  12/03/2017   IR RADIOLOGIST EVAL & MGMT  12/18/2017   JOINT REPLACEMENT Right 2001   Total knee   LAMINECTOMY     X 3 LUMBAR AND X 2 CERVICAL SPINE OPERATIONS   LAPAROSCOPIC CHOLECYSTECTOMY W/ CHOLANGIOGRAPHY  11/09/04   SURGEON DR. Caleen Essex   LEFT HEART CATH AND CORONARY ANGIOGRAPHY N/A 09/16/2018   Procedure: LEFT HEART CATH AND CORONARY ANGIOGRAPHY;  Surgeon: Elder Negus, MD;  [facility];  Service: Cardiovascular;  Laterality: N/A;   LEFT HEART CATHETERIZATION WITH CORONARY ANGIOGRAM N/A 10/29/2014   Procedure: LEFT HEART CATHETERIZATION WITH CORONARY ANGIOGRAM;  Surgeon: Pamella Pert, MD;  Location: [facility] CATH LAB;  Service: Cardiovascular;  Laterality: N/A;   LIGATION OF COMPETING BRANCHES OF ARTERIOVENOUS FISTULA Left 07/21/2020   Procedure: LIGATION OF COMPETING BRANCHES OF LEFT UPPER ARM ARTERIOVENOUS FISTULA;  Surgeon: Maeola Harman, MD;  Location: [facility] OR;  Service: Vascular;  Laterality: Left;   LOWER EXTREMITY ANGIOGRAM N/A 03/19/2012   Procedure: LOWER EXTREMITY ANGIOGRAM;  Surgeon: Kathleene Hazel, MD;  Location: [facility] CATH LAB;  Service: Cardiovascular;  Laterality: N/A;   LOWER EXTREMITY ANGIOGRAPHY N/A 06/20/2021   Procedure: LOWER EXTREMITY ANGIOGRAPHY;  Surgeon: Elder Negus, MD;  [facility];  Service: Cardiovascular;  Laterality: N/A;   NECK SURGERY     PARTIAL COLECTOMY N/A 01/13/2018    Procedure: LAPAROSCOPIC ASSISTED   SIGMOID COLECTOMY ILEOSTOMY;  Surgeon: Berna Bue, MD;  Location: [facility];  Service: General;  Laterality: N/A;   PENILE PROSTHESIS IMPLANT  08/14/05   INFRAPUBIC INSERTION OF INFLATABLE PENILE PROSTHESIS; SURGEON DR. Logan Bores   PENILE PROSTHESIS IMPLANT  PERCUTANEOUS CORONARY STENT INTERVENTION (PCI-S) Right 10/29/2014   Procedure: PERCUTANEOUS CORONARY STENT INTERVENTION (PCI-S);  Surgeon: Pamella Pert, MD;  Location: [facility] CATH LAB;  Service: Cardiovascular;  Laterality: Right;   PERIPHERAL VASCULAR INTERVENTION Left 06/23/2021   Procedure: PERIPHERAL VASCULAR INTERVENTION;  Surgeon: Elder Negus, MD;  [facility];  Service: Cardiovascular;  Laterality: Left;   REMOVAL OF PENILE PROSTHESIS N/A 06/14/2021   Procedure: Removal of THREE piece inflatable penile prosthesis;  Surgeon: Crista Elliot, MD;  Location: [facility] OR;  Service: Urology;  Laterality: N/A;   SHOULDER ARTHROSCOPY     SPINE SURGERY     TOE AMPUTATION Left    TONSILLECTOMY     TOTAL KNEE ARTHROPLASTY  07/2002   RIGHT KNEE ; SURGEON  DR. [name] ALSO HAD ARTHROSCOPIC RIGHT KNEE IN  10/2001   TOTAL KNEE ARTHROPLASTY     ULNAR NERVE TRANSPOSITION Right 10/21/2013   Procedure: RIGHT ELBOW  ULNAR NERVE DECOMPRESSION;  Surgeon: Nicki Reaper, MD;  Location: [facility];  Service: Orthopedics;  Laterality: Right;    Social:  Lives at [facility] recovering from [place], hopes to return home Occupation: retired Support: Wife and facility staff Level of Function: Limited mobility, otherwise independent PCP: Lonie Peak, PA-C Substances: None reported  Family History:  Noncontributory  Allergies: Allergies as of 07/18/2023 - Review Complete 07/18/2023  Allergen Reaction Noted   Contrast media [iodinated contrast media] Shortness Of Breath and Other (See Comments) 09/16/2018   Ivp dye [iodinated contrast media] Anaphylaxis, Shortness Of Breath, and  Other (See Comments) 05/27/2013   Latex Rash and Other (See Comments) 09/02/2021   Tape Other (See Comments) and Rash 10/31/2011    Review of Systems: A complete ROS was negative except as per HPI.   OBJECTIVE:   Physical Exam: Blood pressure (!) 128/59, pulse 62, temperature 98.8 F (37.1 C), temperature source Oral, resp. rate 10, height 6\' 2"  (1.88 m), weight 108.9 kg, SpO2 96%.  Constitutional: Tired-appearing man. In no acute distress. HENT: Normocephalic, atraumatic,  Eyes: Sclera non-icteric, PERRL, EOM intact Neck:normal atraumatic, no neck masses, normal thyroid, no jvd Cardio:Regular rate and rhythm. No murmurs, rubs, or gallops. 2+ bilateral radial and dorsalis pedis  pulses. Pulm:Clear to auscultation bilaterally. Normal work of breathing on room air. Abdomen: Soft, non-tender, non-distended, positive bowel sounds. AVW:[id number] for extremity edema. Skin:Warm and dry. Neuro:Alert and oriented x3. No focal deficit noted. Psych:Pleasant mood and affect.  Labs: CBC    Component Value Date/Time   WBC 7.3 07/18/2023 1353   RBC 3.27 (L) 07/18/2023 1353   HGB 10.1 (L) 07/18/2023 1353   HGB 11.5 (L) 05/30/2020 1646   HCT 30.7 (L) 07/18/2023 1353   HCT 35.0 (L) 05/30/2020 1646   PLT 114 (L) 07/18/2023 1353   PLT 236 05/30/2020 1646   MCV 93.9 07/18/2023 1353   MCV 86 05/30/2020 1646   MCH 30.9 07/18/2023 1353   MCHC 32.9 07/18/2023 1353   RDW 13.7 07/18/2023 1353   RDW 13.9 05/30/2020 1646   LYMPHSABS 0.2 (L) 07/18/2023 1111   MONOABS 0.4 07/18/2023 1111   EOSABS 0.0 07/18/2023 1111   BASOSABS 0.1 07/18/2023 1111     CMP     Component Value Date/Time   NA 132 (L) 07/18/2023 1353   NA 139 05/30/2020 1646   K 5.3 (H) 07/18/2023 1353   CL 100 07/18/2023 1353   CO2 19 (L) 07/18/2023 1353   GLUCOSE 140 (H) 07/18/2023 1353  BUN 75 (H) 07/18/2023 1353   BUN 27 05/30/2020 1646   CREATININE 9.41 (H) 07/18/2023 1353   CREATININE 1.00 02/01/2012 1028   CALCIUM  7.6 (L) 07/18/2023 1353   PROT 6.4 (L) 07/18/2023 1111   PROT 6.3 05/30/2020 1646   ALBUMIN 2.8 (L) 07/18/2023 1353   ALBUMIN 3.7 (L) 05/30/2020 1646   AST 36 07/18/2023 1111   ALT 27 07/18/2023 1111   ALKPHOS 96 07/18/2023 1111   BILITOT 0.7 07/18/2023 1111   BILITOT 0.3 05/30/2020 1646   GFRNONAA 6 (L) 07/18/2023 1353   GFRNONAA 83 02/01/2012 1028   GFRAA 16 (L) 05/30/2020 1646   GFRAA >89 02/01/2012 1028    Imaging: DG Chest Port 1 View  Result Date: 07/18/2023 CLINICAL DATA:  Nauseous EXAM: PORTABLE CHEST 1 VIEW COMPARISON:  Chest x-ray dated May 28, 2023 FINDINGS: Cardiac and mediastinal contours are unchanged when accounting for differences in exam technique. Low lung volumes with hypoventilatory changes. Mild bibasilar opacities which are likely due to atelectasis. Linear interface of the left hemithorax. IMPRESSION: 1. Linear interface of the left hemithorax, likely due to an overlying skin fold although pneumothorax can not be entirely excluded. Recommend repeat chest x-ray. 2. Low lung volumes with mild bibasilar opacities which are likely due to atelectasis. Electronically Signed   By: Allegra Lai M.D.   On: 07/18/2023 13:25     EKG: personally reviewed my interpretation is sinus rhythm with first degree block. Consistent with prior EKG.  ASSESSMENT & PLAN:   Assessment & Plan by Problem: Principal Problem:   Acute encephalopathy   Exavior Kimmons Compean is a 67 y.o. male with pertinent PMH of ESRD on TTS HD, osteomyelitis, and recent treatment for Sepsis due to aspiration pneumonia  who presented with altered mental status and is admitted for Acute Encephalopathy and hyperkalemia.  Acute Encephalopathy Code Sepsis Pt presenting with confusion, fever to 100.6, somnolence and intiated code sepsis in ED with empiric antibiotics and fluids. At present, there is no leukocytosis or lactic acidosis and he is hemodynamically stable. Cranial imaging negative. Toxic ingestion  unlikely in this patient that lives in a SNF. Metabolics reflect derangements from missing dialysis and his present condition could be related to uremia or gabapentin toxicity and may improve after dialysis. Meanwhile, will hold sedating medicines. Per history from patient's wife, his current condition is similar to past sepsis due to aspiration pneumonia and UTI.  Although there is no apparent source of infection, will continue antibiotic coverage and monitor. Will check MRSA nares. - Blood cultures pending - UA to be collected - Vancomycin, Cefepime, Metronidazole - Hold sedating medicines - Emergent dialysis underway  Hyperkalemia Uremia ESRD on TTS HD K 6.6. BUN 75, baseline 40s. Cr 9.53, baseline around 5. Due to hyperkalemia, pt sent to urgent dialysis from ED and he was seen there at admission. Fortunately there are no EKG changes, K is improved midway in dialysis. This is a patient with multiple missed dialysis sessions - about one per week - including this past Tuesday and Thursday. Per dialysis notes, pt had complained of weakness, nausea, and edema for a few weeks, favoring a progressive uremia. - Emergent dialysis underway - Will recheck RFP shortly  Chronic Stable Issues Paroxysmal Afib - Amiodarone 200 mg daily. Apixaban 5 mg twice daily T2DM on insulin - SSI PAD - Atorvastatin 40 mg daily Barrett's Esophagus - Pepcid 20 mg daily BPH - Tamsulosin 0.4 mg daily HTN - Amlodipine 10 mg daily. Hold other home meds,  can be continued at later date. Restless Legs- hold Ropinirole 2 mg nightly Chronic Pain - Resume fentanyl patch 72-hour 50 mcg/h. Hold Gabapentin 100 mg 3 times daily. Hold Oxycodone-acetaminophen 5-325 mg every 8 hours. Continue Lidocaine cream   Diet: NPO pending resolution of encephalopathy VTE: DOAC IVF: None,None Code: Full  Dispo: Admit patient to Observation with expected length of stay less than 2 midnights.  Signed: Katheran James, DO Internal  Medicine Resident PGY-1  07/18/2023, 3:09 PM   Dr. Katheran James, DO Pager [phone]  Please contact the on call pager after 5 pm and on weekends at [phone].

## 2023-07-18 NOTE — ED Provider Notes (Signed)
[place] EMERGENCY DEPARTMENT AT [facility] Provider Note   CSN: [id number] Arrival date & time: 07/18/23  1021     History  Chief Complaint  Patient presents with   Nausea   Weakness    [name] is a 67 y.o. male.  [name] is a 67 y.o. male with a history of CAD, peripheral artery disease, fatty liver, ESRD on hemodialysis, COPD, hypertension, who presents to the emergency department via EMS from [facility] skilled nursing facility for evaluation of altered mental status and reported nausea.  Per EMS personnel they report that staff at nursing facility reported that patient was more somnolent than usual and difficult to wake this morning, EMS reported that he would answer questions appropriately but would quickly fall back asleep, arousable to verbal stimuli.  Patient only complaining of nausea, denies any pain.  Per facility staff patient last dialyzed on Saturday, refused to dialysis on Tuesday.  Staff at the facility reported normal temperature although reports that he felt warm, concern for fever.  Patient somnolent and able to provide limited history.  Level 5 Caveat: AMS  The history is provided by the patient, the nursing home, the EMS personnel and medical records.  Weakness      Home Medications Prior to Admission medications   Medication Sig Start Date End Date Taking? Authorizing Provider  amiodarone (PACERONE) 200 MG tablet Take 1 tablet (200 mg total) by mouth daily. 10/29/22  Yes Yates Decamp, MD  amLODipine (NORVASC) 10 MG tablet Take 10 mg by mouth daily.   Yes [provider]  apixaban (ELIQUIS) 5 MG TABS tablet Take 1 tablet (5 mg total) by mouth 2 (two) times daily. 01/14/22 07/18/23 Yes Almon Hercules, MD  atorvastatin (LIPITOR) 40 MG tablet Take 40 mg by mouth at bedtime.    Yes [provider]  B Complex-C-Zn-Folic Acid (DIALYVITE/ZINC) TABS Take 1 tablet by mouth Every Tuesday,Thursday,and Saturday with dialysis. 10/04/21   Yes [provider]  docusate sodium (COLACE) 100 MG capsule Take 100 mg by mouth 2 (two) times daily.   Yes [provider]  famotidine (PEPCID) 20 MG tablet Take 20 mg by mouth every other day.   Yes [provider]  fentaNYL (DURAGESIC) 25 MCG/HR Place 1 patch onto the skin every 3 (three) days. 06/01/23  Yes Ghimire, Werner Lean, MD  folic acid (FOLVITE) 1 MG tablet Take 1 mg by mouth daily. 09/18/21  Yes [provider]  gabapentin (NEURONTIN) 100 MG capsule Take 100 mg by mouth 3 (three) times daily.   Yes [provider]  hydrALAZINE (APRESOLINE) 50 MG tablet Take 1 tablet (50 mg total) by mouth every 8 (eight) hours. 04/01/23  Yes [name], MD  Icosapent Ethyl 0.5 g CAPS Take 1 capsule by mouth daily.   Yes [provider]  insulin aspart (NOVOLOG FLEXPEN) 100 UNIT/ML FlexPen Inject 0-12 Units into the skin See admin instructions. Inject 4 units into the skin with meals in addition to sliding scale as needed. Inject as per sliding scale: If 0 - 69 = 0 initiate hypoglycemia orders;  70 - 150 = 0;  151 - 200 = 2;  201 - 250 = 4;  251 - 300 = 6;  301 - 350 = 8;  351 - 400 = 10;  401 - 450 = 12.  If >400, give 12 units, recheck in 1 hour. If still >400, call MD/NP for further orders. Subcutaneously with meals in  addition to scheduled 4 unites with meals.   Yes [provider]  insulin aspart (NOVOLOG) 100 UNIT/ML injection Inject 4 Units into the skin 3 (three) times daily with meals.   Yes [provider]  isosorbide mononitrate (IMDUR) 60 MG 24 hr tablet Take 1 tablet (60 mg total) by mouth daily. 01/21/23 07/18/23 Yes Yates Decamp, MD  labetalol (NORMODYNE) 200 MG tablet Take 1 tablet (200 mg total) by mouth 2 (two) times daily. 04/01/23  Yes [name], MD  lidocaine-prilocaine (EMLA) cream Apply 1 application topically See admin instructions. Prior to Dialysis days Tuesday,Thursday and saturday 06/06/21  Yes  [provider]  loratadine (CLARITIN) 10 MG tablet Take 10 mg by mouth daily.   Yes [provider]  metoCLOPramide (REGLAN) 5 MG tablet Take 1 tablet (5 mg total) by mouth every 8 (eight) hours as needed for nausea. 05/31/23 11/27/23 Yes Ghimire, Werner Lean, MD  midodrine (PROAMATINE) 10 MG tablet Take 10 mg by mouth daily as needed (SBP less than 100.).   Yes [provider]  nitroGLYCERIN (NITROSTAT) 0.4 MG SL tablet Place 0.4 mg under the tongue every 5 (five) minutes as needed for chest pain. 01/06/22  Yes [provider]  ondansetron (ZOFRAN) 4 MG tablet Take 1 tablet (4 mg total) by mouth every 8 (eight) hours as needed for nausea or vomiting. 04/24/23  Yes Yates Decamp, MD  oxyCODONE-acetaminophen (PERCOCET/ROXICET) 5-325 MG tablet Take 1 tablet by mouth every 8 (eight) hours as needed for severe pain. 05/31/23  Yes Ghimire, Werner Lean, MD  rOPINIRole (REQUIP) 2 MG tablet Take 2 mg by mouth at bedtime.   Yes [provider]  senna-docusate (SENOKOT-S) 8.6-50 MG tablet Take 2 tablets by mouth at bedtime.   Yes [provider]  tamsulosin (FLOMAX) 0.4 MG CAPS capsule Take 0.4 mg by mouth daily.   Yes [provider]  NEEDLE, REUSABLE, 22 G 22G X 1-1/2" MISC 1 Units by Does not apply route as directed. For B12 IM inj 02/15/18   Elgergawy, Leana Roe, MD  Syringe/Needle, Disp, (SYRINGE 3CC/22GX1-1/2") 22G X 1-1/2" 3 ML MISC 1 Syringe by Does not apply route as directed. For b12 IM inj 02/15/18   Elgergawy, Leana Roe, MD      Allergies    Contrast media [iodinated contrast media], Ivp dye [iodinated contrast media], Latex, and Tape    Review of Systems   Review of Systems  Unable to perform ROS: Mental status change    Physical Exam Updated Vital Signs BP (!) 138/55   Pulse 60   Temp (!) 100.6 F (38.1 C) (Rectal)   Resp (!) 22   Ht 6\' 2"  (1.88 m)   Wt 108.9 kg   SpO2 97%   BMI 30.81 kg/m  Physical Exam Vitals and nursing note  reviewed.  Constitutional:      General: He is not in acute distress.    Appearance: Normal appearance. He is well-developed. He is ill-appearing. He is not diaphoretic.     Comments: Somnolent, but arouses to voice, ill-appearing  HENT:     Head: Normocephalic and atraumatic.     Mouth/Throat:     Mouth: Mucous membranes are moist.     Pharynx: Oropharynx is clear.  Eyes:     General:        Right eye: No discharge.        Left eye: No discharge.     Comments: Pupils 2mm, equal, round unresponsive bilaterally  Cardiovascular:  Rate and Rhythm: Normal rate and regular rhythm.     Pulses: Normal pulses.     Heart sounds: Normal heart sounds.  Pulmonary:     Effort: Pulmonary effort is normal. No respiratory distress.     Breath sounds: Rhonchi present. No wheezing or rales.     Comments: Respirations equal and unlabored, patient able to speak in short sentences, lungs with decreased air movement and some scattered rhonchi throughout, but satting well on room air.  Fentanyl patch noted to left upper chest wall which was removed continued capillary Abdominal:     General: Bowel sounds are normal. There is no distension.     Palpations: Abdomen is soft. There is no mass.     Tenderness: There is no abdominal tenderness. There is no guarding.     Comments: Abdomen soft, nondistended, nontender to palpation in all quadrants without guarding or peritoneal signs  Musculoskeletal:        General: No deformity.     Cervical back: Neck supple.     Comments: Some edema noted to bilateral hands, bilateral AKA  Skin:    General: Skin is warm and dry.     Capillary Refill: Capillary refill takes less than 2 seconds.  Neurological:     Mental Status: He is oriented to person, place, and time.     Coordination: Coordination normal.     Comments: Speech is clear, able to follow commands CN III-XII intact Normal strength in upper and lower extremities bilaterally  Sensation normal to light  and sharp touch  Psychiatric:        Mood and Affect: Mood normal.        Behavior: Behavior normal.     ED Results / Procedures / Treatments   Labs (all labs ordered are listed, but only abnormal results are displayed) Labs Reviewed  COMPREHENSIVE METABOLIC PANEL - Abnormal; Notable for the following components:      Result Value   Sodium 133 (*)    Potassium 6.6 (*)    CO2 20 (*)    Glucose, Bld 142 (*)    BUN 75 (*)    Creatinine, Ser 9.52 (*)    Calcium 7.7 (*)    Total Protein 6.4 (*)    Albumin 3.1 (*)    GFR, Estimated 5 (*)    All other components within normal limits  CBC WITH DIFFERENTIAL/PLATELET - Abnormal; Notable for the following components:   RBC 3.53 (*)    Hemoglobin 10.7 (*)    HCT 32.8 (*)    Platelets 142 (*)    Neutro Abs 9.2 (*)    Lymphs Abs 0.2 (*)    All other components within normal limits  PROTIME-INR - Abnormal; Notable for the following components:   Prothrombin Time 17.3 (*)    INR 1.4 (*)    All other components within normal limits  RENAL FUNCTION PANEL - Abnormal; Notable for the following components:   Sodium 132 (*)    Potassium 5.3 (*)    CO2 19 (*)    Glucose, Bld 140 (*)    BUN 75 (*)    Creatinine, Ser 9.41 (*)    Calcium 7.6 (*)    Phosphorus 6.6 (*)    Albumin 2.8 (*)    GFR, Estimated 6 (*)    All other components within normal limits  CBC - Abnormal; Notable for the following components:   RBC 3.27 (*)    Hemoglobin 10.1 (*)    HCT  30.7 (*)    Platelets 114 (*)    All other components within normal limits  CBG MONITORING, ED - Abnormal; Notable for the following components:   Glucose-Capillary 148 (*)    All other components within normal limits  I-STAT VENOUS BLOOD GAS, ED - Abnormal; Notable for the following components:   pCO2, Ven 31.3 (*)    pO2, Ven 51 (*)    TCO2 21 (*)    Acid-base deficit 4.0 (*)    Sodium 132 (*)    Potassium 6.5 (*)    Calcium, Ion 0.90 (*)    HCT 33.0 (*)    Hemoglobin 11.2 (*)     All other components within normal limits  RESP PANEL BY RT-PCR (RSV, FLU A&B, COVID)  RVPGX2  CULTURE, BLOOD (ROUTINE X 2)  CULTURE, BLOOD (ROUTINE X 2)  APTT  AMMONIA  HEPATITIS B SURFACE ANTIGEN  URINALYSIS, W/ REFLEX TO CULTURE (INFECTION SUSPECTED)  HEPATITIS B SURFACE ANTIBODY, QUANTITATIVE  I-STAT CG4 LACTIC ACID, ED  I-STAT CG4 LACTIC ACID, ED    EKG EKG Interpretation Date/Time:  Thursday July 18 2023 10:26:23 EST Ventricular Rate:  60 PR Interval:  242 QRS Duration:  173 QT Interval:  493 QTC Calculation: 493 R Axis:   -69  Text Interpretation: Sinus rhythm Prolonged PR interval RBBB and LAFB No significant change since last tracing Confirmed by Elayne Snare ([id number]) on 07/18/2023 11:09:25 AM  Radiology DG Chest Port 1 View  Result Date: 07/18/2023 CLINICAL DATA:  Nauseous EXAM: PORTABLE CHEST 1 VIEW COMPARISON:  Chest x-ray dated May 28, 2023 FINDINGS: Cardiac and mediastinal contours are unchanged when accounting for differences in exam technique. Low lung volumes with hypoventilatory changes. Mild bibasilar opacities which are likely due to atelectasis. Linear interface of the left hemithorax. IMPRESSION: 1. Linear interface of the left hemithorax, likely due to an overlying skin fold although pneumothorax can not be entirely excluded. Recommend repeat chest x-ray. 2. Low lung volumes with mild bibasilar opacities which are likely due to atelectasis. Electronically Signed   By: Allegra Lai M.D.   On: 07/18/2023 13:25    Procedures .Critical Care  Performed by: Dartha Lodge, PA-C Authorized by: Dartha Lodge, PA-C   Critical care provider statement:    Critical care time (minutes):  45   Critical care time was exclusive of:  Separately billable procedures and treating other patients   Critical care was necessary to treat or prevent imminent or life-threatening deterioration of the following conditions:  Renal failure   Critical care was time  spent personally by me on the following activities:  Blood draw for specimens, development of treatment plan with patient or surrogate, discussions with primary provider, examination of patient, evaluation of patient's response to treatment, ordering and review of laboratory studies, ordering and review of radiographic studies, review of old charts and re-evaluation of patient's condition   Care discussed with: admitting provider       Medications Ordered in ED Medications  sodium chloride flush (NS) 0.9 % injection 10 mL (10 mLs Intravenous Given 07/18/23 1109)  Chlorhexidine Gluconate Cloth 2 % PADS 6 each (has no administration in time range)  pentafluoroprop-tetrafluoroeth (GEBAUERS) aerosol 1 Application (has no administration in time range)  lidocaine (PF) (XYLOCAINE) 1 % injection 5 mL (has no administration in time range)  lidocaine-prilocaine (EMLA) cream 1 Application (has no administration in time range)  heparin injection 1,000 Units (has no administration in time range)  anticoagulant sodium citrate solution 5 mL (  has no administration in time range)  alteplase (CATHFLO ACTIVASE) injection 2 mg (has no administration in time range)  heparin injection 2,200 Units (2,200 Units Dialysis Given 07/18/23 1354)  heparin injection 5,000 Units (has no administration in time range)  acetaminophen (TYLENOL) tablet 650 mg (has no administration in time range)    Or  acetaminophen (TYLENOL) suppository 650 mg (has no administration in time range)  polyethylene glycol (MIRALAX / GLYCOLAX) packet 17 g (has no administration in time range)  sodium chloride 0.9 % bolus 500 mL (0 mLs Intravenous Stopped 07/18/23 1236)  ceFEPIme (MAXIPIME) 2 g in sodium chloride 0.9 % 100 mL IVPB (0 g Intravenous Stopped 07/18/23 1203)  metroNIDAZOLE (FLAGYL) IVPB 500 mg (0 mg Intravenous Stopped 07/18/23 1235)  acetaminophen (TYLENOL) tablet 1,000 mg (1,000 mg Oral Given 07/18/23 1133)  vancomycin (VANCOCIN) IVPB 1000  mg/200 mL premix (1,000 mg Intravenous New Bag/Given 07/18/23 1127)    And  vancomycin (VANCOCIN) IVPB 1000 mg/200 mL premix (1,000 mg Intravenous New Bag/Given 07/18/23 1126)  calcium gluconate 1 g/ 50 mL sodium chloride IVPB (0 mg Intravenous Stopped 07/18/23 1308)  sodium zirconium cyclosilicate (LOKELMA) packet 10 g (10 g Oral Given 07/18/23 1255)    ED Course/ Medical Decision Making/ A&P                                 Medical Decision Making Amount and/or Complexity of Data Reviewed Labs: ordered. Radiology: ordered. ECG/medicine tests: ordered.  Risk OTC drugs. Prescription drug management. Decision regarding hospitalization.   67 y.o. male presents to the ED with complaints of altered mental status, this involves an extensive number of treatment options, and is a complaint that carries with it a high risk of complications and morbidity.  The differential diagnosis includes infection/sepsis, metabolic encephalopathy, uremia, hyperkalemia, hyperammonemia, intracranial hemorrhage or mass.  On arrival ptsomnolent and ill-appearing, arouses to verbal stimuli but quickly falls back asleep, on arrival patient is noted to be febrile and mildly tachypneic but the remainder patient's vitals are stable, code sepsis initiated.  Additional history obtained from EMS personnel and nursing facility staff. Previous records obtained and reviewed   I ordered medication including broad-spectrum antibiotics for fever of unknown origin and possible sepsis, 500 cc fluid bolus, given patient's dialysis status, will hold on additional fluids pending initial lactic acid, patient has not exhibited any hypotension.  Patient also given Tylenol for fever, and calcium for hyperkalemia  Lab Tests:  I Ordered, reviewed, and interpreted labs, which included: No leukocytosis and normal hemoglobin.  Patient noted to be hyperkalemic with potassium of 6.6, patient has not received dialysis since Saturday, elevated  BUN of 75, higher than patient's typical baseline of 20-40, suspect this is the likely source of his encephalopathy, ammonia is normal, lactic acid is normal and the remainder of patient's labs are reassuring.  In the setting of fever on arrival COVID, flu and RSV testing completed and these were all negative.  Imaging Studies ordered:  I ordered imaging studies which included CXR, head and chest/abd/pel CT, I independently visualized and interpreted imaging which showed low lung volumes and some mild bibasilar opacities on CXR, head CT without intracranial bleed or mass, CT chest & abd without obvious abnormality, pending radiologist read upon pt admission  ED Course:   Critical interventions: Dialysis, calcium and lokelma for hyperkalemia, amd pt remains on cardiac monitor, broad spectrum anx given for possibel sepsis in setting of  fever and encephalopathy.  I consulted nephrology and discussed lab and imaging findings with Dr. Ronalee Belts, recommends giving [facility] in addition to calcium for hyperkalemia, and will work on getting patient up for dialysis this afternoon.  Medicine consulted for admission and case discussed with internal medicine teaching service, who will see and admit the patient.  Portions of this note were generated with Scientist, clinical (histocompatibility and immunogenetics). Dictation errors may occur despite best attempts at proofreading.         Final Clinical Impression(s) / ED Diagnoses Final diagnoses:  Encephalopathy, unspecified type  Hyperkalemia  ESRD needing dialysis (HCC)  Sepsis, due to unspecified organism, unspecified whether acute organ dysfunction present [facility])    Rx / DC Orders ED Discharge Orders     None         Dartha Lodge, Cordelia Poche 07/19/23 2028    Elayne Snare K, DO 07/19/23 2305

## 2023-07-18 NOTE — ED Triage Notes (Signed)
Pt arrives via EMS from [facility] due for dialysis today. Pt arousable, answers questions appropriately. Nauseous. VSS in transport.

## 2023-07-19 DIAGNOSIS — N186 End stage renal disease: Secondary | ICD-10-CM

## 2023-07-19 DIAGNOSIS — G9340 Encephalopathy, unspecified: Secondary | ICD-10-CM | POA: Diagnosis not present

## 2023-07-19 DIAGNOSIS — G9341 Metabolic encephalopathy: Secondary | ICD-10-CM | POA: Diagnosis not present

## 2023-07-19 DIAGNOSIS — E875 Hyperkalemia: Secondary | ICD-10-CM

## 2023-07-19 LAB — RENAL FUNCTION PANEL
Albumin: 2.8 g/dL — ABNORMAL LOW (ref 3.5–5.0)
Anion gap: 9 (ref 5–15)
BUN: 49 mg/dL — ABNORMAL HIGH (ref 8–23)
CO2: 25 mmol/L (ref 22–32)
Calcium: 7.8 mg/dL — ABNORMAL LOW (ref 8.9–10.3)
Chloride: 99 mmol/L (ref 98–111)
Creatinine, Ser: 6.83 mg/dL — ABNORMAL HIGH (ref 0.61–1.24)
GFR, Estimated: 8 mL/min — ABNORMAL LOW (ref >60)
Glucose, Bld: 123 mg/dL — ABNORMAL HIGH (ref 70–99)
Phosphorus: 5.2 mg/dL — ABNORMAL HIGH (ref 2.5–4.6)
Potassium: 4.4 mmol/L (ref 3.5–5.1)
Sodium: 133 mmol/L — ABNORMAL LOW (ref 135–145)

## 2023-07-19 LAB — CBC
HCT: 32.4 % — ABNORMAL LOW (ref 39.0–52.0)
Hemoglobin: 10.6 g/dL — ABNORMAL LOW (ref 13.0–17.0)
MCH: 29.9 pg (ref 26.0–34.0)
MCHC: 32.7 g/dL (ref 30.0–36.0)
MCV: 91.5 fL (ref 80.0–100.0)
Platelets: 121 10*3/uL — ABNORMAL LOW (ref 150–400)
RBC: 3.54 MIL/uL — ABNORMAL LOW (ref 4.22–5.81)
RDW: 13.7 % (ref 11.5–15.5)
WBC: 5.7 10*3/uL (ref 4.0–10.5)
nRBC: 0 % (ref 0.0–0.2)

## 2023-07-19 LAB — GLUCOSE, CAPILLARY
Glucose-Capillary: 145 mg/dL — ABNORMAL HIGH (ref 70–99)
Glucose-Capillary: 156 mg/dL — ABNORMAL HIGH (ref 70–99)
Glucose-Capillary: 194 mg/dL — ABNORMAL HIGH (ref 70–99)
Glucose-Capillary: 235 mg/dL — ABNORMAL HIGH (ref 70–99)

## 2023-07-19 LAB — HEPATITIS B SURFACE ANTIBODY, QUANTITATIVE: Hep B S AB Quant (Post): 10.3 m[IU]/mL

## 2023-07-19 LAB — HEMOGLOBIN A1C
Hgb A1c MFr Bld: 5.7 % — ABNORMAL HIGH (ref 4.8–5.6)
Mean Plasma Glucose: 116.89 mg/dL

## 2023-07-19 LAB — HIV ANTIBODY (ROUTINE TESTING W REFLEX): HIV Screen 4th Generation wRfx: NONREACTIVE

## 2023-07-19 MED ORDER — OXYCODONE-ACETAMINOPHEN 5-325 MG PO TABS
1.0000 | ORAL_TABLET | Freq: Three times a day (TID) | ORAL | Status: DC | PRN
  Administered 2023-07-19 – 2023-07-20 (×2): 1 via ORAL
  Filled 2023-07-19 (×2): qty 1

## 2023-07-19 MED ORDER — ROPINIROLE HCL 1 MG PO TABS
2.0000 mg | ORAL_TABLET | Freq: Every day | ORAL | Status: DC
  Administered 2023-07-19: 2 mg via ORAL
  Filled 2023-07-19: qty 2

## 2023-07-19 MED ORDER — IPRATROPIUM-ALBUTEROL 0.5-2.5 (3) MG/3ML IN SOLN: 3.0000 mL | RESPIRATORY_TRACT | Status: DC | PRN

## 2023-07-19 MED ORDER — CHLORHEXIDINE GLUCONATE CLOTH 2 % EX PADS
6.0000 | MEDICATED_PAD | Freq: Every day | CUTANEOUS | Status: DC
  Administered 2023-07-19 – 2023-07-20 (×2): 6 via TOPICAL

## 2023-07-19 MED ORDER — HYDRALAZINE HCL 50 MG PO TABS
50.0000 mg | ORAL_TABLET | Freq: Three times a day (TID) | ORAL | Status: DC
  Administered 2023-07-19 – 2023-07-20 (×3): 50 mg via ORAL
  Filled 2023-07-19 (×4): qty 1

## 2023-07-19 MED ORDER — IPRATROPIUM-ALBUTEROL 0.5-2.5 (3) MG/3ML IN SOLN
3.0000 mL | RESPIRATORY_TRACT | Status: DC
  Administered 2023-07-19: 3 mL via RESPIRATORY_TRACT
  Filled 2023-07-19: qty 3

## 2023-07-19 MED ORDER — ISOSORBIDE MONONITRATE ER 60 MG PO TB24
60.0000 mg | ORAL_TABLET | Freq: Every day | ORAL | Status: DC
  Administered 2023-07-19 – 2023-07-20 (×2): 60 mg via ORAL
  Filled 2023-07-19 (×2): qty 1

## 2023-07-19 NOTE — Progress Notes (Signed)
OT Cancellation Note  Patient Details Name: [name] MRN: [id number] DOB: Apr 05, 1956   Cancelled Treatment:    Reason Eval/Treat Not Completed: OT screened, no needs identified, will sign off Pt LTC resident at [facility]. Receives assist with transfers and BADL tasks from staff. Recommend staff OT evaluate pt upon return to SNF to determine any need for OT services.   Limmie Patricia, OTR/L,CBIS  Supplemental OT - [facility] and [facility] Secure Chat Preferred   07/19/2023, 1:26 PM

## 2023-07-19 NOTE — Progress Notes (Signed)
PT Cancellation Note  Patient Details Name: [name] MRN: [id number] DOB: 09-08-1956   Cancelled Treatment:    Reason Eval/Treat Not Completed: PT screened, no needs identified, will sign off. Pt declines PT evaluation. Pt reports staff assist him in transfers with use of hoyer lift. Pt reports rarely sitting up without back support. Pt does not appear to have acute PT needs at this time. PT recommends return to [facility] when medically appropriate.   Arlyss Gandy 07/19/2023, 12:33 PM

## 2023-07-19 NOTE — Progress Notes (Addendum)
HD#0 SUBJECTIVE:  Patient Summary: Mr [name] is a 67 yo M with PMH ESRD on TTS HD, COPD who presented to [facility] ED via EMS on 11/7 due to altered mental status and is admitted for acute encephalopathy and hyperkalemia.  Overnight Events: None  Interim History: Pt with some complaint of generalized pain which is his baseline. He denies acute concerns. Denies fevers, chills, confusion, headache, dysuria, abdominal pain. He recalls the events of yesterday evening.  OBJECTIVE:  Vital Signs: Vitals:   07/18/23 1739 07/18/23 1850 07/19/23 0455 07/19/23 0743  BP: (!) 179/65 (!) 176/67 (!) 148/55 (!) 151/54  Pulse: 79 82 68 66  Resp: 17 18  17   Temp:  99.4 F (37.4 C) 99 F (37.2 C) 98.7 F (37.1 C)  TempSrc:  Oral  Oral  SpO2: 97% 100% 95% 95%  Weight:  108.9 kg    Height:       Supplemental O2: Room Air SpO2: 95 %  Filed Weights   07/18/23 1023 07/18/23 1850  Weight: 108.9 kg 108.9 kg     Intake/Output Summary (Last 24 hours) at 07/19/2023 1109 Last data filed at 07/19/2023 0800 Gross per 24 hour  Intake 1271.22 ml  Output 3300 ml  Net -2028.78 ml   Net IO Since Admission: -2,028.78 mL [07/19/23 1109]  Physical Exam: Physical Exam Constitutional:      General: He is not in acute distress.    Appearance: Normal appearance. He is not ill-appearing.  HENT:     Mouth/Throat:     Mouth: Mucous membranes are moist.  Cardiovascular:     Rate and Rhythm: Normal rate and regular rhythm.     Pulses: Normal pulses.  Pulmonary:     Effort: Pulmonary effort is normal.     Breath sounds: Wheezing present.  Abdominal:     General: Abdomen is flat.     Palpations: Abdomen is soft.     Tenderness: There is no abdominal tenderness.  Musculoskeletal:        General: No tenderness.     Comments: Bilateral BKAs. No acute rashes/lesions appreciated.  Skin:    General: Skin is warm and dry.     Capillary Refill: Capillary refill takes less than 2 seconds.  Neurological:      General: No focal deficit present.     Mental Status: He is alert and oriented to person, place, and time.  Psychiatric:        Mood and Affect: Mood normal.        Behavior: Behavior normal.     Patient Lines/Drains/Airways Status     Active Line/Drains/Airways     Name Placement date Placement time Site Days   Peripheral IV 07/18/23 18 G 1.16" Right Antecubital 07/18/23  1047  Antecubital  1   Fistula / Graft Left Upper arm Arteriovenous fistula 07/21/20  0825  Upper arm  1093   External Urinary Catheter 07/18/23  1850  --  1   Wound / Incision (Open or Dehisced) 03/30/23 (IAD) Incontinence Associated Dermatitis;Skin tear Sacrum Bilateral brief saturated upon admission to unit. small areas 2x2 cm of skin tear on bilateral buttocks, cleansed and mepilex foam applied to protect s 03/30/23  2150  Sacrum  111   Wound / Incision (Open or Dehisced) 07/18/23 (IAD) Incontinence Associated Dermatitis;Skin tear Thigh Left;Posterior 4x 5 cm skin tear on posterior of L thigh, mepilex applied 07/18/23  1850  Thigh  1  ASSESSMENT/PLAN:  Assessment: Principal Problem:   Acute encephalopathy  [name] is a 67 y.o. male with pertinent PMH of ESRD on TTS HD, osteomyelitis, and recent treatment for Sepsis due to aspiration pneumonia  who presented with altered mental status and is admitted for Acute Encephalopathy and hyperkalemia.   Acute Encephalopathy Uremia vs Gabapentin Toxicity Encephalopathy resolving. He was more alert yesterday after dialysis and has improved further this morning. Aox3. Answers all questions fully and appropriately. Afebrile overnight, no hypotension, no leukocytosis, infection appears unlikely at this point. Favor encephalopathy due to uremia, so will stop empiric antibiotics. BUN improved after dialysis. Will keep patient for observation one more day, discharge tomorrow if remains well. - Cranial imaging negative. - Hold gabapentin - Will check MRSA  nares. - Prelim blood cultures without growth - Stop Vancomycin, Cefepime, Metronidazole - Hold unnecessary sedating medicines   Hyperkalemia ESRD on TTS HD K 6.6 at admission now 4.4 this am after urgent dialysis yesterday. BUN 49 from 75, approaching baseline. Cr 6.8 from 9.53, baseline around 5. No EKG changes. No chest pain. This is a patient with multiple missed dialysis sessions - about one per week - including this past Tuesday and Thursday. Per dialysis notes, pt had complained of weakness, nausea, and edema for a few weeks, favoring a progressive uremia. If here, he will get dialysis tomorrow. - Monitor electrolytes   COPD without exacerbation Pt with wheezes on exam. No hypoxia or dyspnea. No home meds for this. Will give him duonebs. Consider discharge with inhaler.  Chronic Stable Issues Paroxysmal Afib - Amiodarone 200 mg daily. Apixaban 5 mg twice daily T2DM on insulin - SSI PAD - Atorvastatin 40 mg daily Barrett's Esophagus - Pepcid 20 mg daily BPH - Tamsulosin 0.4 mg daily HTN - Amlodipine 10 mg daily. Resume Imdur 60 every day. Resume hydralazine 50 TID. Restless Legs- hold Ropinirole 2 mg nightly Chronic Pain - Resume fentanyl patch 72-hour 50 mcg/h. Hold Gabapentin 100 mg 3 times daily. Resume Oxycodone-acetaminophen 5-325 mg every 8 hours. Continue Lidocaine cream.  Best Practice: Diet: Regular diet IVF: None VTE: Doac Code: Full AB: None Therapy Recs: pending Family Contact: Ravindra Altschuler, [phone]  DISPO: Anticipated discharge tomorrow to Skilled nursing facility pending  Medical workup .  Signature: Katheran James, D.O.  Internal Medicine Resident, PGY-1 [facility]  Pager: [phone] 11:09 AM, 07/19/2023   Please contact the on call pager after 5 pm and on weekends at [phone].

## 2023-07-19 NOTE — Plan of Care (Signed)

## 2023-07-19 NOTE — Progress Notes (Signed)
[facility] Progress Note   Subjective:   Patient seen and examined at bedside.  Tolerated dialysis well yesterday.  Denies CP, SOB, edema, abdominal pain and n/v/d.  Objective Vitals:   07/18/23 1739 07/18/23 1850 07/19/23 0455 07/19/23 0743  BP: (!) 179/65 (!) 176/67 (!) 148/55 (!) 151/54  Pulse: 79 82 68 66  Resp: 17 18  17   Temp:  99.4 F (37.4 C) 99 F (37.2 C) 98.7 F (37.1 C)  TempSrc:  Oral  Oral  SpO2: 97% 100% 95% 95%  Weight:  108.9 kg    Height:       Physical Exam General:alert male in NAD Heart:RRR Lungs:+wheezing/rhonchi, nml WOB on RA Abdomen:soft, NTND Extremities:no LE edema Dialysis Access: RU AVF +b/t   Filed Weights   07/18/23 1023 07/18/23 1850  Weight: 108.9 kg 108.9 kg    Intake/Output Summary (Last 24 hours) at 07/19/2023 0820 Last data filed at 07/19/2023 0200 Gross per 24 hour  Intake 1051.22 ml  Output 3300 ml  Net -2248.78 ml    Additional Objective Labs: Basic Metabolic Panel: Recent Labs  Lab 07/18/23 1353 07/18/23 1927 07/19/23 0537  NA 132* 134* 133*  K 5.3* 4.0 4.4  CL 100 95* 99  CO2 19* 23 25  GLUCOSE 140* 111* 123*  BUN 75* 41* 49*  CREATININE 9.41* 6.10* 6.83*  CALCIUM 7.6* 7.8* 7.8*  PHOS 6.6* 4.1 5.2*   Liver Function Tests: Recent Labs  Lab 07/18/23 1111 07/18/23 1353 07/18/23 1927 07/19/23 0537  AST 36  --   --   --   ALT 27  --   --   --   ALKPHOS 96  --   --   --   BILITOT 0.7  --   --   --   PROT 6.4*  --   --   --   ALBUMIN 3.1* 2.8* 3.1* 2.8*   CBC: Recent Labs  Lab 07/18/23 1111 07/18/23 1118 07/18/23 1353 07/19/23 0537  WBC 9.9  --  7.3 5.7  NEUTROABS 9.2*  --   --   --   HGB 10.7* 11.2* 10.1* 10.6*  HCT 32.8* 33.0* 30.7* 32.4*  MCV 92.9  --  93.9 91.5  PLT 142*  --  114* 121*   Blood Culture    Component Value Date/Time   SDES BLOOD RIGHT ARM 07/18/2023 1111   SPECREQUEST  07/18/2023 1111    BOTTLES DRAWN AEROBIC AND ANAEROBIC Blood Culture adequate volume    CULT  07/18/2023 1111    NO GROWTH < 24 HOURS Performed at [facility], [address], [place], [place] [address]    REPTSTATUS PENDING 07/18/2023 1111    CBG: Recent Labs  Lab 07/18/23 1027 07/18/23 1852 07/18/23 2046 07/19/23 0739  GLUCAP 148* 98 126* 145*    Studies/Results: CT CHEST ABDOMEN PELVIS WO CONTRAST  Result Date: 07/18/2023 CLINICAL DATA:  Sepsis, nausea, weakness EXAM: CT CHEST, ABDOMEN AND PELVIS WITHOUT CONTRAST TECHNIQUE: Multidetector CT imaging of the chest, abdomen and pelvis was performed following the standard protocol without IV contrast. RADIATION DOSE REDUCTION: This exam was performed according to the departmental dose-optimization program which includes automated exposure control, adjustment of the mA and/or kV according to patient size and/or use of iterative reconstruction technique. COMPARISON:  05/28/2023, 06/13/2021, 06/19/2019 FINDINGS: CT CHEST FINDINGS Cardiovascular: Aortic atherosclerosis. Aortic valve calcifications. Cardiomegaly. Three-vessel coronary artery calcifications. No pericardial effusion. Mediastinum/Nodes: No enlarged mediastinal, hilar, or axillary lymph nodes. Thyroid gland, trachea, and esophagus demonstrate  no significant findings. Lungs/Pleura: Diffuse bilateral bronchial wall thickening. Dependent bibasilar scarring or atelectasis. No pleural effusion or pneumothorax. Musculoskeletal: No chest wall abnormality. No acute osseous findings. CT ABDOMEN PELVIS FINDINGS Hepatobiliary: No focal liver abnormality is seen. Status post cholecystectomy. Postoperative biliary ductal dilatation. Pancreas: Unchanged fluid attenuation cystic lesion in the ventral pancreatic neck measuring 1.8 x 1.8 cm (series 2, image 77). No pancreatic ductal dilatation or surrounding inflammatory changes. Spleen: Normal in size without significant abnormality. Adrenals/Urinary Tract: Adrenal glands are unremarkable. Simple, benign right renal cortical cysts, for  which no further follow-up or characterization is required kidneys are otherwise normal, without renal calculi, obvious solid lesion, or hydronephrosis. Bladder is unremarkable. Stomach/Bowel: Stomach is within normal limits. Appendix appears normal. No evidence of bowel wall thickening, distention, or inflammatory changes. Evidence of prior diverting right lower quadrant ileostomy. Moderate burden of stool and stool balls throughout the colon. Vascular/Lymphatic: Aortic atherosclerosis. Unchanged prominent subcentimeter retroperitoneal lymph nodes. Reproductive: No mass or other abnormality. Other: No abdominal wall hernia or abnormality. No ascites. Musculoskeletal: No acute osseous findings. Disc degenerative disease and bridging osteophytosis throughout the thoracic and lumbar spine, in keeping with DISH. IMPRESSION: 1. No acute noncontrast CT findings of the chest, abdomen, or pelvis to explain sepsis. 2. Diffuse bilateral bronchial wall thickening, consistent with nonspecific infectious or inflammatory bronchitis. Dependent bibasilar atelectasis or consolidation. 3. Moderate burden of stool and stool balls throughout the colon. 4. 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 This is minimally if at all enlarged in comparison to prior examinations dating back to 06/19/2019, and given size and well established indolent behavior, is benign requiring further follow-up or characterization. 5. Cardiomegaly and coronary artery disease. 6. Aortic valve calcifications. Correlate for echocardiographic evidence of aortic valve dysfunction. Aortic Atherosclerosis (ICD10-I70.0). Electronically Signed   By: Jearld Lesch M.D.   On: 07/18/2023 15:49   CT Head Wo Contrast  Result Date: 07/18/2023 CLINICAL DATA:  Altered level of consciousness, nausea, weakness EXAM: CT HEAD WITHOUT CONTRAST TECHNIQUE: Contiguous axial images were obtained  from the base of the skull through the vertex without intravenous contrast. RADIATION DOSE REDUCTION: This exam was performed according to the departmental dose-optimization program which includes automated exposure control, adjustment of the mA and/or kV according to patient size and/or use of iterative reconstruction technique. COMPARISON:  05/28/2023 FINDINGS: Brain: Stable chronic ischemic changes within the periventricular white matter, left frontal lobe, and right parietal lobe. No evidence of acute infarct or hemorrhage. The lateral ventricles and midline structures are stable. No acute extra-axial fluid collections. No mass effect. Vascular: No hyperdense vessel or unexpected calcification. Skull: Normal. Negative for fracture or focal lesion. Sinuses/Orbits: No acute finding. Other: None. IMPRESSION: 1. Stable chronic ischemic changes as above. No acute intracranial process. Electronically Signed   By: Sharlet Salina M.D.   On: 07/18/2023 15:26   DG Chest Port 1 View  Result Date: 07/18/2023 CLINICAL DATA:  Nauseous EXAM: PORTABLE CHEST 1 VIEW COMPARISON:  Chest x-ray dated May 28, 2023 FINDINGS: Cardiac and mediastinal contours are unchanged when accounting for differences in exam technique. Low lung volumes with hypoventilatory changes. Mild bibasilar opacities which are likely due to atelectasis. Linear interface of the left hemithorax. IMPRESSION: 1. Linear interface of the left hemithorax, likely due to an overlying skin fold although pneumothorax can not be entirely excluded. Recommend repeat chest x-ray. 2. Low lung volumes with mild bibasilar opacities which are  likely due to atelectasis. Electronically Signed   By: Allegra Lai M.D.   On: 07/18/2023 13:25    Medications:  ceFEPime (MAXIPIME) IV Stopped (07/18/23 2045)   vancomycin Stopped (07/18/23 2110)    amiodarone  200 mg Oral Daily   amLODipine  10 mg Oral Daily   apixaban  5 mg Oral BID   atorvastatin  40 mg Oral Daily    Chlorhexidine Gluconate Cloth  6 each Topical Q0600   famotidine  20 mg Oral Daily   fentaNYL  1 patch Transdermal Q72H   insulin aspart  0-5 Units Subcutaneous QHS   insulin aspart  0-9 Units Subcutaneous TID WC   metroNIDAZOLE  500 mg Oral Q12H   sodium chloride flush  10 mL Intravenous Q12H   tamsulosin  0.4 mg Oral Daily    Dialysis Orders:  Center: [facility]  on TTS . 180NRe 4 hours 15 min BFR 450 DFR 800 EDW 107.4kg 2K2Ca AVF 15g No heparin Venofer 100mg  IV q HD- first dose given 07/13/23 Hectorol IV q HD   Assessment/Plan:  Encephalopathy: Patient with AMS and reported nausea and weakness over the past several weeks. He was febrile on arrival and sepsis protocol was started. May also be a component of uremia, increased non compliance recently. Appears close to baseline today. Hyperkalemia: K+ 6.6 on arrival due to missed HD. Urgent HD yesterday. Now resolved post HD. Asterixis/Tremor: Likely due to missed HD +/- gabapentin. Would recommend holding gabapentin. He reports tremor is chronic and has been present 2+ years.  ESRD:  TTS schedule but poor compliance lately due to feeling weak. HD yesterday per regular schedule. Next HD tomorrow.   Hypertension/volume: BP moderately elevated. Continue home meds.  Does not appear grossly overloaded, CT chest reassuring. On RA with no SOB. Close to dry weight, possible weight loss, will challenge EDW with HD tomorrow.   Anemia: Hgb 10.6, no ESA indicated at this time. Hold iron due to sepsis work up  Metabolic bone disease: Corrected Ca ok. Phos in goal.  Continue home meds.   T2DM: Management per primary team Nutrition - Renal diet w/fluid restrictions. Protein supplements.   Virgina Norfolk, PA-C [facility] 07/19/2023,8:20 AM  LOS: 0 days

## 2023-07-19 NOTE — TOC Progression Note (Signed)
Transition of Care [facility]) - Progression Note    Patient Details  Name: [name] MRN: [id number] Date of Birth: 05/28/1956  Transition of Care [facility]) CM/SW Contact  Jerel Sardina A [place], [place] Phone Number: 07/19/2023, 12:38 PM  Clinical Narrative:     CSW reached out to [facility], spoke with Colin Mulders, stated pt can return when medically stable. Per provider note, possible DC tomorrow, can return over the weekend.   TOC will continue to follow.        Expected Discharge Plan and Services                                               Social Determinants of Health (SDOH) Interventions SDOH Screenings   Food Insecurity: No Food Insecurity (07/19/2023)  Housing: Low Risk  (07/19/2023)  Transportation Needs: No Transportation Needs (07/19/2023)  Utilities: Not At Risk (07/19/2023)  Tobacco Use: High Risk (07/18/2023)    Readmission Risk Interventions    05/29/2023    4:41 PM 06/30/2021   11:53 AM  Readmission Risk Prevention Plan  Transportation Screening Complete Complete  Medication Review [employer]) Complete Complete  PCP or Specialist appointment within 3-5 days of discharge Complete   HRI or Home Care Consult Complete Complete  SW Recovery Care/Counseling Consult Complete Complete  Palliative Care Screening Not Applicable Not Applicable  Skilled Nursing Facility Complete Not Applicable

## 2023-07-19 NOTE — Progress Notes (Signed)
Pt receives out-pt HD at [facility] on TTS. Will assist as needed.   Veleta Yamamoto Renal Navigator [phone] 

## 2023-07-20 DIAGNOSIS — R531 Weakness: Secondary | ICD-10-CM | POA: Diagnosis not present

## 2023-07-20 DIAGNOSIS — G9341 Metabolic encephalopathy: Secondary | ICD-10-CM | POA: Diagnosis not present

## 2023-07-20 DIAGNOSIS — Z7401 Bed confinement status: Secondary | ICD-10-CM | POA: Diagnosis not present

## 2023-07-20 DIAGNOSIS — I10 Essential (primary) hypertension: Secondary | ICD-10-CM | POA: Diagnosis not present

## 2023-07-20 LAB — RENAL FUNCTION PANEL
Albumin: 2.6 g/dL — ABNORMAL LOW (ref 3.5–5.0)
Anion gap: 14 (ref 5–15)
BUN: 62 mg/dL — ABNORMAL HIGH (ref 8–23)
CO2: 24 mmol/L (ref 22–32)
Calcium: 7.9 mg/dL — ABNORMAL LOW (ref 8.9–10.3)
Chloride: 97 mmol/L — ABNORMAL LOW (ref 98–111)
Creatinine, Ser: 7.77 mg/dL — ABNORMAL HIGH (ref 0.61–1.24)
GFR, Estimated: 7 mL/min — ABNORMAL LOW (ref >60)
Glucose, Bld: 200 mg/dL — ABNORMAL HIGH (ref 70–99)
Phosphorus: 6.5 mg/dL — ABNORMAL HIGH (ref 2.5–4.6)
Potassium: 4.2 mmol/L (ref 3.5–5.1)
Sodium: 135 mmol/L (ref 135–145)

## 2023-07-20 LAB — CBC
HCT: 29.2 % — ABNORMAL LOW (ref 39.0–52.0)
Hemoglobin: 9.6 g/dL — ABNORMAL LOW (ref 13.0–17.0)
MCH: 30.1 pg (ref 26.0–34.0)
MCHC: 32.9 g/dL (ref 30.0–36.0)
MCV: 91.5 fL (ref 80.0–100.0)
Platelets: 128 10*3/uL — ABNORMAL LOW (ref 150–400)
RBC: 3.19 MIL/uL — ABNORMAL LOW (ref 4.22–5.81)
RDW: 13.8 % (ref 11.5–15.5)
WBC: 4.4 10*3/uL (ref 4.0–10.5)
nRBC: 0 % (ref 0.0–0.2)

## 2023-07-20 LAB — GLUCOSE, CAPILLARY
Glucose-Capillary: 173 mg/dL — ABNORMAL HIGH (ref 70–99)
Glucose-Capillary: 182 mg/dL — ABNORMAL HIGH (ref 70–99)
Glucose-Capillary: 133 mg/dL — ABNORMAL HIGH (ref 70–99)

## 2023-07-20 MED ORDER — LIDOCAINE 5 % EX PTCH: 1.0000 | MEDICATED_PATCH | Freq: Every day | CUTANEOUS | Status: DC | PRN

## 2023-07-20 MED ORDER — GERHARDT'S BUTT CREAM: 1.0000 | TOPICAL_CREAM | Freq: Three times a day (TID) | CUTANEOUS | Status: DC | PRN

## 2023-07-20 NOTE — Discharge Summary (Addendum)
Name: [name] MRN: [id number] DOB: Mar 28, 1956 67 y.o. PCP: Lonie Peak, PA-C  Date of Admission: 07/18/2023 10:21 AM Date of Discharge: 07/20/2023 Attending Physician: Dr. 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)   Paroxysmal atrial fibrillation (HCC)   Hyperkalemia   ESRD needing dialysis [facility])    Discharge Medications: Allergies as of 07/20/2023       Reactions   Contrast Media [iodinated Contrast Media] Shortness Of Breath, Other (See Comments)   Difficulty breathing and altered mental status   Ivp Dye [iodinated Contrast Media] Anaphylaxis, Shortness Of Breath, Other (See Comments)   Breathing problems, altered mental state   Latex Rash, Other (See Comments)   A severe rash appears after the first 24 hours of being placed   Tape Other (See Comments), Rash   Rash after 1 day of use Do not leave on longer then 3 days with out be changed        Medication List     STOP taking these medications    metoCLOPramide 5 MG tablet Commonly known as: Reglan       TAKE these medications    amiodarone 200 MG tablet Commonly known as: PACERONE Take 1 tablet (200 mg total) by mouth daily.   amLODipine 10 MG tablet Commonly known as: NORVASC Take 10 mg by mouth daily.   apixaban 5 MG Tabs tablet Commonly known as: ELIQUIS Take 1 tablet (5 mg total) by mouth 2 (two) times daily.   atorvastatin 40 MG tablet Commonly known as: LIPITOR Take 40 mg by mouth at bedtime.   Dialyvite/Zinc Tabs Take 1 tablet by mouth Every Tuesday,Thursday,and Saturday with dialysis.   docusate sodium 100 MG capsule Commonly known as:  COLACE Take 100 mg by mouth 2 (two) times daily.   famotidine 20 MG tablet Commonly known as: PEPCID Take 20 mg by mouth every other day.   fentaNYL 25 MCG/HR Commonly known as: DURAGESIC Place 1 patch onto the skin every 3 (three) days.   folic acid 1 MG tablet Commonly known as: FOLVITE Take 1 mg by mouth daily.   gabapentin 100 MG capsule Commonly known as: NEURONTIN Take 100 mg by mouth 3 (three) times daily.   [name]'s butt cream Crea Apply 1 Application topically 3 (three) times daily as needed for irritation.   hydrALAZINE 50 MG tablet Commonly known as: APRESOLINE Take 1 tablet (50 mg total) by mouth every 8 (eight) hours.   Icosapent Ethyl 0.5 g Caps Take 1 capsule by mouth daily.   isosorbide mononitrate 60 MG 24 hr tablet Commonly known as: IMDUR Take 1 tablet (60 mg total) by mouth daily.   labetalol 200 MG tablet Commonly known as: NORMODYNE Take 1 tablet (200 mg total) by mouth 2 (two) times daily.   lidocaine 5 % Commonly known as: LIDODERM Place 1 patch onto the skin daily as needed. Remove & Discard patch within 12 hours or as directed by MD   lidocaine-prilocaine cream Commonly known as: EMLA Apply 1 application topically See admin instructions. Prior to Dialysis days Tuesday,Thursday and saturday   loratadine 10 MG tablet Commonly known as: CLARITIN Take 10 mg by mouth daily.   midodrine  10 MG tablet Commonly known as: PROAMATINE Take 10 mg by mouth daily as needed (SBP less than 100.).   NEEDLE (REUSABLE) 22 G 22G X 1-1/2" Misc 1 Units by Does not apply route as directed. For B12 IM inj   nitroGLYCERIN 0.4 MG SL tablet Commonly known as: NITROSTAT Place 0.4 mg under the tongue every 5 (five) minutes as needed for chest pain.   NovoLOG FlexPen 100 UNIT/ML FlexPen Generic drug: insulin aspart Inject 0-12 Units into the skin See admin instructions. Inject 4 units into the skin with meals in addition to sliding scale as needed. Inject  as per sliding scale: If 0 - 69 = 0 initiate hypoglycemia orders;  70 - 150 = 0;  151 - 200 = 2;  201 - 250 = 4;  251 - 300 = 6;  301 - 350 = 8;  351 - 400 = 10;  401 - 450 = 12.  If >400, give 12 units, recheck in 1 hour. If still >400, call MD/NP for further orders. Subcutaneously with meals in addition to scheduled 4 unites with meals.   insulin aspart 100 UNIT/ML injection Commonly known as: novoLOG Inject 4 Units into the skin 3 (three) times daily with meals.   ondansetron 4 MG tablet Commonly known as: Zofran Take 1 tablet (4 mg total) by mouth every 8 (eight) hours as needed for nausea or vomiting.   oxyCODONE-acetaminophen 5-325 MG tablet Commonly known as: PERCOCET/ROXICET Take 1 tablet by mouth every 8 (eight) hours as needed for severe pain.   rOPINIRole 2 MG tablet Commonly known as: REQUIP Take 2 mg by mouth at bedtime.   senna-docusate 8.6-50 MG tablet Commonly known as: Senokot-S Take 2 tablets by mouth at bedtime.   SYRINGE 3CC/22GX1-1/2" 22G X 1-1/2" 3 ML Misc 1 Syringe by Does not apply route as directed. For b12 IM inj   tamsulosin 0.4 MG Caps capsule Commonly known as: FLOMAX Take 0.4 mg by mouth daily.        Disposition and follow-up:   Mr.Eriberto L Ochocki was discharged from [facility] in Stable condition.  At the hospital follow up visit please address:  1.  Follow-up:  a.  ESRD: Ensure patient continues to have dialysis outpatient.  Tuesday, Thursday, Saturdays    b.  Encephalopathy: Resolved at discharge.  Likely was secondary to uremia.  Continue monitor mental status outpatient.   c.  Hypertension: Patient is on both antihypertensives as well as midodrine. Continue to monitor outpatient.   2.  Labs / imaging needed at time of follow-up: Renal function panel, CBC  3.  Pending labs/ test needing follow-up: None  4.  Medication Changes  1 ) Discontinue midodrine and gabapentin  Follow-up Appointments:  Follow-up  Information     Lonie Peak, PA-C. Call in 2 day(s).   Specialty: Physician Assistant Why: Please call your PCP in 2 days to make hospital follow up Contact information: [id number] [place] [address] [phone]                 Hospital Course by problem list: STATON BYFIELD is a 67 y.o. male with pertinent PMH of ESRD on TTS HD, osteomyelitis, and recent treatment for Sepsis due to aspiration pneumonia who presented to [facility] ED on 11/7 with altered mental status and was admitted for acute encephalopathy, uremia, and hyperkalemia.   Acute Encephalopathy Uremia vs Gabapentin Toxicity This patient typically misses dialysis once per week due to fecal incontinence.  He missed two of his previous three scheduled sessions. His encephalopathy improved after urgent dialysis on admission day and appeared fully resolved on his first morning. Initially was code sepsis due to AMS and fever to 100.6. Broad abx were started: vanc, cefepime, metronidazole. Thereafter he was afebrile, exhibited no hypotension, no leukocytosis. Favored encephalopathy due to uremia vs gabapentin toxicity so stopped empiric antibiotics. Pt had complained of weakness, nausea, and edema for a few weeks, favoring a progressive uremia. Pt was kept in hospital for one additional night for observation after stopping antibiotics. At discharge, recommend stopping gabapentin. Solutions to improve patient comfort at dialysis incontinence may make him more compliant.   Hyperkalemia ESRD on TTS HD K 6.6 at admission and patient sent to urgent dialysis. BUN 75 improved to 49, baseline 40. Cr 9.53 improved to 6.8, baseline around 5. No EKG changes. No chest pain. Likely due to missed dialysis and corrected there.  Urgent dialysis on Thursday and routine on Saturday 11/09. Can resume regular schedule HD outpatient.    COPD without exacerbation Pt with wheezes on exam. No hypoxia or dyspnea. No home meds for this. We gave him  duonebs. Consider maintenance inhaler going forward.  Discharge Subjective:  Feeling well today. BM yesterday no NV, CP, SOB. No fevers chills. Feels back to baseline. Will get dialysis today and then will go back to [facility] where he stays. Patient voiced understanding of the plan.   Discharge Exam:   BP (!) 147/52   Pulse 69   Temp 98.3 F (36.8 C) (Oral)   Resp 17   Ht 6\' 2"  (1.88 m)   Wt 110.7 kg   SpO2 97%   BMI 31.33 kg/m  Constitutional: Resting in bed, no acute distress HENT: normocephalic atraumatic, mucous membranes moist Cardiovascular: regular rate and rhythm, no m/r/g Pulmonary/Chest: normal work of breathing on room air, lungs clear to auscultation bilaterally Abdominal: soft, non-tender, non-distended MSK: Bilateral BKAs appreciated  Pertinent Labs, Studies, and Procedures:     Latest Ref Rng & Units 07/20/2023    4:00 AM 07/19/2023    5:37 AM 07/18/2023    1:53 PM  CBC  WBC 4.0 - 10.5 K/uL 4.4  5.7  7.3   Hemoglobin 13.0 - 17.0 g/dL 9.6  13.2  44.0   Hematocrit 39.0 - 52.0 % 29.2  32.4  30.7   Platelets 150 - 400 K/uL 128  121  114        Latest Ref Rng & Units 07/20/2023    4:00 AM 07/19/2023    5:37 AM 07/18/2023    7:27 PM  CMP  Glucose 70 - 99 mg/dL 102  725  366   BUN 8 - 23 mg/dL 62  49  41   Creatinine 0.61 - 1.24 mg/dL 4.40  3.47  4.25   Sodium 135 - 145 mmol/L 135  133  134   Potassium 3.5 - 5.1 mmol/L 4.2  4.4  4.0   Chloride 98 - 111 mmol/L 97  99  95   CO2 22 - 32 mmol/L 24  25  23    Calcium 8.9 - 10.3 mg/dL 7.9  7.8  7.8     CT CHEST ABDOMEN PELVIS WO CONTRAST  Result Date: 07/18/2023 CLINICAL DATA:  Sepsis, nausea, weakness EXAM: CT CHEST, ABDOMEN AND PELVIS WITHOUT CONTRAST TECHNIQUE: Multidetector CT imaging of the chest, abdomen and pelvis was performed following the standard protocol without IV contrast. RADIATION DOSE REDUCTION: This exam was performed according to the  departmental dose-optimization program which includes  automated exposure control, adjustment of the mA and/or kV according to patient size and/or use of iterative reconstruction technique. COMPARISON:  05/28/2023, 06/13/2021, 06/19/2019 FINDINGS: CT CHEST FINDINGS Cardiovascular: Aortic atherosclerosis. Aortic valve calcifications. Cardiomegaly. Three-vessel coronary artery calcifications. No pericardial effusion. Mediastinum/Nodes: No enlarged mediastinal, hilar, or axillary lymph nodes. Thyroid gland, trachea, and esophagus demonstrate no significant findings. Lungs/Pleura: Diffuse bilateral bronchial wall thickening. Dependent bibasilar scarring or atelectasis. No pleural effusion or pneumothorax. Musculoskeletal: No chest wall abnormality. No acute osseous findings. CT ABDOMEN PELVIS FINDINGS Hepatobiliary: No focal liver abnormality is seen. Status post cholecystectomy. Postoperative biliary ductal dilatation. Pancreas: Unchanged fluid attenuation cystic lesion in the ventral pancreatic neck measuring 1.8 x 1.8 cm (series 2, image 77). No pancreatic ductal dilatation or surrounding inflammatory changes. Spleen: Normal in size without significant abnormality. Adrenals/Urinary Tract: Adrenal glands are unremarkable. Simple, benign right renal cortical cysts, for which no further follow-up or characterization is required kidneys are otherwise normal, without renal calculi, obvious solid lesion, or hydronephrosis. Bladder is unremarkable. Stomach/Bowel: Stomach is within normal limits. Appendix appears normal. No evidence of bowel wall thickening, distention, or inflammatory changes. Evidence of prior diverting right lower quadrant ileostomy. Moderate burden of stool and stool balls throughout the colon. Vascular/Lymphatic: Aortic atherosclerosis. Unchanged prominent subcentimeter retroperitoneal lymph nodes. Reproductive: No mass or other abnormality. Other: No abdominal wall hernia or abnormality. No ascites. Musculoskeletal: No acute osseous findings. Disc  degenerative disease and bridging osteophytosis throughout the thoracic and lumbar spine, in keeping with DISH. IMPRESSION: 1. No acute noncontrast CT findings of the chest, abdomen, or pelvis to explain sepsis. 2. Diffuse bilateral bronchial wall thickening, consistent with nonspecific infectious or inflammatory bronchitis. Dependent bibasilar atelectasis or consolidation. 3. Moderate burden of stool and stool balls throughout the colon. 4. 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 This is minimally if at all enlarged in comparison to prior examinations dating back to 06/19/2019, and given size and well established indolent behavior, is benign requiring further follow-up or characterization. 5. Cardiomegaly and coronary artery disease. 6. Aortic valve calcifications. Correlate for echocardiographic evidence of aortic valve dysfunction. Aortic Atherosclerosis (ICD10-I70.0). Electronically Signed   By: Jearld Lesch M.D.   On: 07/18/2023 15:49   CT Head Wo Contrast  Result Date: 07/18/2023 CLINICAL DATA:  Altered level of consciousness, nausea, weakness EXAM: CT HEAD WITHOUT CONTRAST TECHNIQUE: Contiguous axial images were obtained from the base of the skull through the vertex without intravenous contrast. RADIATION DOSE REDUCTION: This exam was performed according to the departmental dose-optimization program which includes automated exposure control, adjustment of the mA and/or kV according to patient size and/or use of iterative reconstruction technique. COMPARISON:  05/28/2023 FINDINGS: Brain: Stable chronic ischemic changes within the periventricular white matter, left frontal lobe, and right parietal lobe. No evidence of acute infarct or hemorrhage. The lateral ventricles and midline structures are stable. No acute extra-axial fluid collections. No mass effect. Vascular: No hyperdense vessel or unexpected calcification.  Skull: Normal. Negative for fracture or focal lesion. Sinuses/Orbits: No acute finding. Other: None. IMPRESSION: 1. Stable chronic ischemic changes as above. No acute intracranial process. Electronically Signed   By: Sharlet Salina M.D.   On: 07/18/2023 15:26   DG Chest Port 1 View  Result Date: 07/18/2023 CLINICAL DATA:  Nauseous EXAM: PORTABLE CHEST 1 VIEW COMPARISON:  Chest x-ray dated May 28, 2023 FINDINGS: Cardiac and mediastinal contours are unchanged when  accounting for differences in exam technique. Low lung volumes with hypoventilatory changes. Mild bibasilar opacities which are likely due to atelectasis. Linear interface of the left hemithorax. IMPRESSION: 1. Linear interface of the left hemithorax, likely due to an overlying skin fold although pneumothorax can not be entirely excluded. Recommend repeat chest x-ray. 2. Low lung volumes with mild bibasilar opacities which are likely due to atelectasis. Electronically Signed   By: Allegra Lai M.D.   On: 07/18/2023 13:25     Discharge Instructions:  Discharge instructions   Complete by: As directed    Mr. Serigo Coutts  It was a pleasure taking care of you at [facility]. You were admitted for altered mental status thought to be from missing dialysis.  We are discharging now that you are feeling better.  Please follow the following instructions.   1) You will be going back to [facility] which they will take great care of you there.  2) Stop taking midodrine, you can take all of your other medications as indicated.  Stop taking gabapentin  3) We are not changing any other medications.  Please follow-up with your primary care physician in 2 weeks.  Do not miss any dialysis appointments.  4) If you develop any fevers, chills, shortness of breath, or have further altered mental status, please return back to the emergency department.  Take care,  Dr. Modena Slater, DO   Increase activity slowly   Complete by: As directed     Increase activity slowly   Complete by: As directed    No wound care   Complete by: As directed    No wound care   Complete by: As directed        Signed: Modena Slater, DO 07/20/2023, 12:35 PM   Pager: [phone]

## 2023-07-20 NOTE — TOC Transition Note (Signed)
Transition of Care [facility]) - CM/SW Discharge Note   Patient Details  Name: [name] MRN: [id number] Date of Birth: [birth year]  [facility] CM/SW Contact:  Deatra Robinson, [place] Phone Number: 07/20/2023, 12:45 PM   Clinical Narrative: pt for dc back to [facility] today where he is a LTC/SNF resident. Spoke to [place] at [facility] who confirmed they are prepared to admit pt to room 203B. Pt's wife Lawson Fiscal aware of dc and reports agreeable. RN provided with number for report and [facility] will be arranged for transport after HD. SW signing off at dc.   Dellie Burns, MSW, LCSW [phone] (coverage)        Final next level of care: Skilled Nursing Facility Barriers to Discharge: Barriers Resolved   Patient Goals and CMS Choice      Discharge Placement                Patient chooses bed at: [facility] Patient to be transferred to facility by: [facility] Name of family member notified: [name]/wife Patient and family notified of of transfer: 07/20/23  Discharge Plan and Services Additional resources added to the After Visit Summary for                                       Social Determinants of Health (SDOH) Interventions SDOH Screenings   Food Insecurity: No Food Insecurity (07/19/2023)  Housing: Low Risk  (07/19/2023)  Transportation Needs: No Transportation Needs (07/19/2023)  Utilities: Not At Risk (07/19/2023)  Tobacco Use: High Risk (07/18/2023)     Readmission Risk Interventions    05/29/2023    4:41 PM 06/30/2021   11:53 AM  Readmission Risk Prevention Plan  Transportation Screening Complete Complete  Medication Review [employer]) Complete Complete  PCP or Specialist appointment within 3-5 days of discharge Complete   HRI or Home Care Consult Complete Complete  SW Recovery Care/Counseling Consult Complete Complete  Palliative Care Screening Not Applicable Not Applicable  Skilled Nursing Facility Complete Not Applicable

## 2023-07-20 NOTE — Progress Notes (Signed)
[facility] Progress Note   Subjective:   Patient seen and examined at bedside.  Feeling better today.  Denies CP, SOB, abdominal pain and n/v/d.   Objective Vitals:   07/19/23 1637 07/19/23 2035 07/20/23 0458 07/20/23 0735  BP: (!) 123/47 (!) 126/46 (!) 132/54 (!) 141/50  Pulse: 63 63 61 63  Resp: 17 20 18 16   Temp: 98.1 F (36.7 C) 99.1 F (37.3 C) 98.7 F (37.1 C) 98.8 F (37.1 C)  TempSrc:    Oral  SpO2: 100% 98% 97% 100%  Weight:      Height:       Physical Exam General:chronically ill appearing male in NAD Heart:RRR, no mrg Lungs:+wheezing, nml WOB on RA Abdomen:soft, NTND Extremities:no LE edema, b/l BKA Dialysis Access: RU AVF +b/t   Filed Weights   07/18/23 1023 07/18/23 1850  Weight: 108.9 kg 108.9 kg    Intake/Output Summary (Last 24 hours) at 07/20/2023 1031 Last data filed at 07/20/2023 0700 Gross per 24 hour  Intake 120 ml  Output 200 ml  Net -80 ml    Additional Objective Labs: Basic Metabolic Panel: Recent Labs  Lab 07/18/23 1927 07/19/23 0537 07/20/23 0400  NA 134* 133* 135  K 4.0 4.4 4.2  CL 95* 99 97*  CO2 23 25 24   GLUCOSE 111* 123* 200*  BUN 41* 49* 62*  CREATININE 6.10* 6.83* 7.77*  CALCIUM 7.8* 7.8* 7.9*  PHOS 4.1 5.2* 6.5*   Liver Function Tests: Recent Labs  Lab 07/18/23 1111 07/18/23 1353 07/18/23 1927 07/19/23 0537 07/20/23 0400  AST 36  --   --   --   --   ALT 27  --   --   --   --   ALKPHOS 96  --   --   --   --   BILITOT 0.7  --   --   --   --   PROT 6.4*  --   --   --   --   ALBUMIN 3.1*   < > 3.1* 2.8* 2.6*   < > = values in this interval not displayed.   CBC: Recent Labs  Lab 07/18/23 1111 07/18/23 1118 07/18/23 1353 07/19/23 0537 07/20/23 0400  WBC 9.9  --  7.3 5.7 4.4  NEUTROABS 9.2*  --   --   --   --   HGB 10.7*   < > 10.1* 10.6* 9.6*  HCT 32.8*   < > 30.7* 32.4* 29.2*  MCV 92.9  --  93.9 91.5 91.5  PLT 142*  --  114* 121* 128*   < > = values in this interval not displayed.    CBG: Recent Labs  Lab 07/19/23 0739 07/19/23 1119 07/19/23 1639 07/19/23 2039 07/20/23 0711  GLUCAP 145* 156* 194* 235* 173*    Studies/Results: CT CHEST ABDOMEN PELVIS WO CONTRAST  Result Date: 07/18/2023 CLINICAL DATA:  Sepsis, nausea, weakness EXAM: CT CHEST, ABDOMEN AND PELVIS WITHOUT CONTRAST TECHNIQUE: Multidetector CT imaging of the chest, abdomen and pelvis was performed following the standard protocol without IV contrast. RADIATION DOSE REDUCTION: This exam was performed according to the departmental dose-optimization program which includes automated exposure control, adjustment of the mA and/or kV according to patient size and/or use of iterative reconstruction technique. COMPARISON:  05/28/2023, 06/13/2021, 06/19/2019 FINDINGS: CT CHEST FINDINGS Cardiovascular: Aortic atherosclerosis. Aortic valve calcifications. Cardiomegaly. Three-vessel coronary artery calcifications. No pericardial effusion. Mediastinum/Nodes: No enlarged mediastinal, hilar, or axillary lymph nodes. Thyroid gland, trachea, and esophagus demonstrate no significant  findings. Lungs/Pleura: Diffuse bilateral bronchial wall thickening. Dependent bibasilar scarring or atelectasis. No pleural effusion or pneumothorax. Musculoskeletal: No chest wall abnormality. No acute osseous findings. CT ABDOMEN PELVIS FINDINGS Hepatobiliary: No focal liver abnormality is seen. Status post cholecystectomy. Postoperative biliary ductal dilatation. Pancreas: Unchanged fluid attenuation cystic lesion in the ventral pancreatic neck measuring 1.8 x 1.8 cm (series 2, image 77). No pancreatic ductal dilatation or surrounding inflammatory changes. Spleen: Normal in size without significant abnormality. Adrenals/Urinary Tract: Adrenal glands are unremarkable. Simple, benign right renal cortical cysts, for which no further follow-up or characterization is required kidneys are otherwise normal, without renal calculi, obvious solid lesion, or  hydronephrosis. Bladder is unremarkable. Stomach/Bowel: Stomach is within normal limits. Appendix appears normal. No evidence of bowel wall thickening, distention, or inflammatory changes. Evidence of prior diverting right lower quadrant ileostomy. Moderate burden of stool and stool balls throughout the colon. Vascular/Lymphatic: Aortic atherosclerosis. Unchanged prominent subcentimeter retroperitoneal lymph nodes. Reproductive: No mass or other abnormality. Other: No abdominal wall hernia or abnormality. No ascites. Musculoskeletal: No acute osseous findings. Disc degenerative disease and bridging osteophytosis throughout the thoracic and lumbar spine, in keeping with DISH. IMPRESSION: 1. No acute noncontrast CT findings of the chest, abdomen, or pelvis to explain sepsis. 2. Diffuse bilateral bronchial wall thickening, consistent with nonspecific infectious or inflammatory bronchitis. Dependent bibasilar atelectasis or consolidation. 3. Moderate burden of stool and stool balls throughout the colon. 4. 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 This is minimally if at all enlarged in comparison to prior examinations dating back to 06/19/2019, and given size and well established indolent behavior, is benign requiring further follow-up or characterization. 5. Cardiomegaly and coronary artery disease. 6. Aortic valve calcifications. Correlate for echocardiographic evidence of aortic valve dysfunction. Aortic Atherosclerosis (ICD10-I70.0). Electronically Signed   By: Jearld Lesch M.D.   On: 07/18/2023 15:49   CT Head Wo Contrast  Result Date: 07/18/2023 CLINICAL DATA:  Altered level of consciousness, nausea, weakness EXAM: CT HEAD WITHOUT CONTRAST TECHNIQUE: Contiguous axial images were obtained from the base of the skull through the vertex without intravenous contrast. RADIATION DOSE REDUCTION: This exam was performed according  to the departmental dose-optimization program which includes automated exposure control, adjustment of the mA and/or kV according to patient size and/or use of iterative reconstruction technique. COMPARISON:  05/28/2023 FINDINGS: Brain: Stable chronic ischemic changes within the periventricular white matter, left frontal lobe, and right parietal lobe. No evidence of acute infarct or hemorrhage. The lateral ventricles and midline structures are stable. No acute extra-axial fluid collections. No mass effect. Vascular: No hyperdense vessel or unexpected calcification. Skull: Normal. Negative for fracture or focal lesion. Sinuses/Orbits: No acute finding. Other: None. IMPRESSION: 1. Stable chronic ischemic changes as above. No acute intracranial process. Electronically Signed   By: Sharlet Salina M.D.   On: 07/18/2023 15:26   DG Chest Port 1 View  Result Date: 07/18/2023 CLINICAL DATA:  Nauseous EXAM: PORTABLE CHEST 1 VIEW COMPARISON:  Chest x-ray dated May 28, 2023 FINDINGS: Cardiac and mediastinal contours are unchanged when accounting for differences in exam technique. Low lung volumes with hypoventilatory changes. Mild bibasilar opacities which are likely due to atelectasis. Linear interface of the left hemithorax. IMPRESSION: 1. Linear interface of the left hemithorax, likely due to an overlying skin fold although pneumothorax can not be entirely excluded. Recommend repeat chest x-ray. 2. Low lung volumes with mild bibasilar opacities which are likely due  to atelectasis. Electronically Signed   By: Allegra Lai M.D.   On: 07/18/2023 13:25    Medications:   amiodarone  200 mg Oral Daily   amLODipine  10 mg Oral Daily   apixaban  5 mg Oral BID   atorvastatin  40 mg Oral Daily   Chlorhexidine Gluconate Cloth  6 each Topical Q0600   Chlorhexidine Gluconate Cloth  6 each Topical Q0600   famotidine  20 mg Oral Daily   fentaNYL  1 patch Transdermal Q72H   hydrALAZINE  50 mg Oral Q8H   insulin  aspart  0-5 Units Subcutaneous QHS   insulin aspart  0-9 Units Subcutaneous TID WC   isosorbide mononitrate  60 mg Oral Daily   rOPINIRole  2 mg Oral QHS   sodium chloride flush  10 mL Intravenous Q12H   tamsulosin  0.4 mg Oral Daily    Dialysis Orders:  Center: [facility]  on TTS . 180NRe 4 hours 15 min BFR 450 DFR 800 EDW 107.4kg 2K2Ca AVF 15g No heparin Venofer 100mg  IV q HD- first dose given 07/13/23 Hectorol IV q HD   Assessment/Plan:  Encephalopathy: Patient with AMS and reported nausea and weakness over the past several weeks. He was febrile on arrival and sepsis protocol was started. May also be a component of uremia, increased non compliance recently. Appears at baseline today. Hyperkalemia: 2/2 to missed HD. Now resolved.  Asterixis/Tremor: Likely due to missed HD +/- gabapentin. Would recommend holding gabapentin. He reports tremor is chronic and has been present 2+ years.  ESRD:  TTS schedule but poor compliance lately due to feeling weak. HD to per regular schedule.  Hypertension/volume: BP moderately elevated. Continue home meds.  Does not appear grossly overloaded, CT chest reassuring. On RA with no SOB. Close to dry weight, possible weight loss, will challenge EDW with HD today.  Anemia: Hgb 9.6, monitor. Hold iron due to sepsis work up  Metabolic bone disease: Corrected Ca ok. Phos elevated. Continue home meds.   T2DM: Management per primary team Nutrition - Renal diet w/fluid restrictions. Protein supplements.   Virgina Norfolk, PA-C [facility] 07/20/2023,10:31 AM  LOS: 0 days

## 2023-07-20 NOTE — Procedures (Signed)
HD Note:  Some information was entered later than the data was gathered due to patient care needs. The stated time with the data is accurate.  Received patient in bed to unit.   Alert and oriented.   Informed consent signed and in chart.   Access used: Upper left arm fistula Access issues: None  Patient tolerated treatment well.   TX duration: 4 hours  Alert, without acute distress.  Total UF removed: 3000 ml  Hand-off given to patient's nurse.   Transported back to the room   Keontre Defino L. Dareen Piano, RN Kidney Dialysis Unit.

## 2023-07-20 NOTE — Discharge Instructions (Addendum)
[name]  It was a pleasure taking care of you at [facility]. You were admitted for altered mental status thought to be from missing dialysis.  We are discharging now that you are feeling better.  Please follow the following instructions.   1) You will be going back to [facility] which they will take great care of you there.  2) We are not changing any other medications.  Please follow-up with your primary care physician in 2 weeks.  Do not miss any dialysis appointments.  3) If you develop any fevers, chills, shortness of breath, or have further altered mental status, please return back to the emergency department.  Take care,  Dr. Modena Slater, DO

## 2023-07-20 NOTE — Plan of Care (Signed)
  Problem: Education: Goal: Ability to describe self-care measures that may prevent or decrease complications (Diabetes Survival Skills Education) will improve Outcome: Progressing   

## 2023-07-20 NOTE — [place] FL2 (Signed)
[place] MEDICAID FL2 LEVEL OF CARE FORM     IDENTIFICATION  Patient Name: [name] Birthdate: 03/25/1956 Sex: male Admission Date (Current Location): 07/18/2023  [facility] and [place] Number:  Producer, television/film/video and Address:  [facility], [address] [place], [place] [address]      Provider Number: [id number]  Attending Physician Name and Address:  Reymundo Poll, MD  Relative Name and Phone Number:       Current Level of Care: Hospital Recommended Level of Care: Skilled Nursing Facility Prior Approval Number:    Date Approved/Denied:   PASRR Number: [id number]  Discharge Plan: SNF    Current Diagnoses: Patient Active Problem List   Diagnosis Date Noted   Hyperkalemia 07/19/2023   ESRD needing dialysis (HCC) 07/19/2023   Severe sepsis (HCC) 05/28/2023   Acute encephalopathy 05/28/2023   Acute respiratory failure with hypoxia (HCC) 05/28/2023   Benign hypertension 04/01/2023   Precordial pain 03/31/2023   History of right coronary artery stent placement 03/31/2023   Current use of long term anticoagulation 03/31/2023   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) 03/31/2023   Paroxysmal atrial fibrillation (HCC) 03/31/2023   Hypertensive crisis 03/31/2023   Unstable angina (HCC) 03/30/2023   Tobacco abuse 03/30/2023   Prolonged QT interval 03/30/2023   Elevated troponin 03/30/2023   Cellulitis 11/20/2022   Chest wall pain 07/04/2022   S/P BKA (below knee amputation) unilateral, left (HCC) 07/04/2022   Subacute osteomyelitis, left ankle and foot (HCC) 07/03/2022   Physical deconditioning 01/13/2022   Anemia of renal disease 01/13/2022   Obesity (BMI 30-39.9) 01/13/2022   Decubitus ulcer of buttock 01/13/2022   Intractable nausea and vomiting with epigastric pain 01/11/2022   Chronic osteomyelitis (HCC) 01/11/2022   Proliferative diabetic retinopathy of left eye with macular edema associated with type  2 diabetes mellitus (HCC) 11/27/2021   Vitreous hemorrhage of right eye (HCC) 11/27/2021   Sepsis (HCC) 09/02/2021   Severe sepsis with lactic acidosis (HCC) 06/14/2021   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) 06/14/2021   ESRD on hemodialysis with hyperkalemia 06/14/2021   Paroxysmal atrial fibrillation with RVR (HCC) 06/14/2021   Carotid stenosis 06/15/2020   Dyslipidemia 06/15/2020   Diabetic macular edema of right eye with proliferative retinopathy associated with type 2 diabetes mellitus (HCC) 01/18/2020   Severe nonproliferative diabetic retinopathy of left eye, with macular edema, associated with type 2 diabetes mellitus (HCC) 01/04/2020   Retinal hemorrhage of right eye 01/04/2020   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   Pseudoclaudication 11/15/2018   S/P BKA (below knee amputation), right (HCC) 09/18/2018   Amputation of toe of left foot (HCC) 09/18/2018   Peripheral artery disease (HCC) 09/18/2018   S/P carotid endarterectomy 09/18/2018   OSA not tolerating CPAP 09/18/2018   Chronic back pain 09/18/2018   Status post reversal of ileostomy 05/21/2018   Normocytic anemia 02/14/2018   Intra-abdominal abscess (HCC) 11/04/2017   Chronic venous hypertension (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 (HCC) 12/11/2016   PAD (peripheral artery disease) (HCC) 11/12/2016   Unilateral primary osteoarthritis, left knee 10/11/2016   History of right below knee amputation (HCC) 07/12/2016   Infected diabetic left foot ulcer with chronic osteomyelitis    Diabetic foot infection (HCC) 05/04/2016   Insulin dependent type 2 diabetes mellitus (HCC) 12/16/2015   Amputated toe (HCC) 10/21/2015   Cellulitis of left lower extremity 09/03/2015   Leg edema, left 09/03/2015   CAD (dz of  distal, mid and proximal RCA with implantation of 3 overlapping drug-eluting stent, with elevated troponin 09/03/2015   Chronic diastolic heart failure, NYHA class 2 (HCC)  09/03/2015   Hyperlipidemia 08/25/2014   Limb pain 03/20/2013   DJD (degenerative joint disease) 09/25/2012   Migraine 09/25/2012   Neuropathy 09/25/2012   Restless legs syndrome (RLS) 09/25/2012   Chronic obstructive pulmonary disease, unspecified (HCC) 04/25/2012   Unknown cause of morbidity or mortality 04/25/2012   Chronic total occlusion of artery of the extremities (HCC) 04/08/2012   Onychomycosis 02/01/2012   Occlusion and stenosis of carotid artery without mention of cerebral infarction 06/12/2011   Barrett's esophagus without dysplasia 05/08/2011   Former tobacco use 02/01/2011    Orientation RESPIRATION BLADDER Height & Weight     Self, Time, Situation, Place  Normal Incontinent, External catheter Weight: 240 lb 1.3 oz (108.9 kg) Height:  6\' 2"  (188 cm)  BEHAVIORAL SYMPTOMS/MOOD NEUROLOGICAL BOWEL NUTRITION STATUS      Continent Diet (DC summary)  AMBULATORY STATUS COMMUNICATION OF NEEDS Skin   Extensive Assist Verbally Other (Comment) (skin tear on sacrum and thigh;)                       Personal Care Assistance Level of Assistance  Bathing, Feeding, Dressing Bathing Assistance: Maximum assistance Feeding assistance: Limited assistance Dressing Assistance: Maximum assistance     Functional Limitations Info             SPECIAL CARE FACTORS FREQUENCY                       Contractures Contractures Info: Not present    Additional Factors Info  Code Status, Allergies, Insulin Sliding Scale Code Status Info: Full Allergies Info: Contrast Media (Iodinated Contrast Media), Ivp Dye (Iodinated Contrast Media), Latex, Tape   Insulin Sliding Scale Info: See dc summary       Current Medications (07/20/2023):  This is the current hospital active medication list Current Facility-Administered Medications  Medication Dose Route Frequency Provider Last Rate Last Admin   amiodarone (PACERONE) tablet 200 mg  200 mg Oral Daily Modena Slater, DO   200 mg at  07/20/23 0834   amLODipine (NORVASC) tablet 10 mg  10 mg Oral Daily Modena Slater, DO   10 mg at 07/20/23 0834   apixaban (ELIQUIS) tablet 5 mg  5 mg Oral BID Modena Slater, DO   5 mg at 07/20/23 1610   atorvastatin (LIPITOR) tablet 40 mg  40 mg Oral Daily Modena Slater, DO   40 mg at 07/20/23 0834   Chlorhexidine Gluconate Cloth 2 % PADS 6 each  6 each Topical Q0600 Maxie Barb, MD   6 each at 07/20/23 [phone]   Chlorhexidine Gluconate Cloth 2 % PADS 6 each  6 each Topical Q0600 Penninger, Lillia Abed, [place]   6 each at 07/20/23 0613   famotidine (PEPCID) tablet 20 mg  20 mg Oral Daily Modena Slater, DO   20 mg at 07/20/23 0833   fentaNYL (DURAGESIC) 50 MCG/HR 1 patch  1 patch Transdermal Q72H Katheran James, DO   1 patch at 07/18/23 1950   [name]'s butt cream   Topical TID PRN Katheran James, DO       hydrALAZINE (APRESOLINE) tablet 50 mg  50 mg Oral Q8H Patel, Amar, DO   50 mg at 07/20/23 0843   insulin aspart (novoLOG) injection 0-5 Units  0-5 Units Subcutaneous QHS Modena Slater, DO   2 Units  at 07/19/23 2231   insulin aspart (novoLOG) injection 0-9 Units  0-9 Units Subcutaneous TID WC Modena Slater, DO   2 Units at 07/20/23 0834   ipratropium-albuterol (DUONEB) 0.5-2.5 (3) MG/3ML nebulizer solution 3 mL  3 mL Nebulization Q4H PRN Dickie La, MD       isosorbide mononitrate (IMDUR) 24 hr tablet 60 mg  60 mg Oral Daily Modena Slater, DO   60 mg at 07/20/23 0833   lidocaine (LIDODERM) 5 % 1 patch  1 patch Transdermal Daily PRN Katheran James, DO       lidocaine (LMX) 4 % cream   Topical TID PRN Katheran James, DO       oxyCODONE-acetaminophen (PERCOCET/ROXICET) 5-325 MG per tablet 1 tablet  1 tablet Oral Q8H PRN Katheran James, DO   1 tablet at 07/20/23 0843   polyethylene glycol (MIRALAX / GLYCOLAX) packet 17 g  17 g Oral Daily PRN Modena Slater, DO       rOPINIRole (REQUIP) tablet 2 mg  2 mg Oral QHS Juberg, Christopher, DO   2 mg at 07/19/23 2229   sodium chloride flush (NS) 0.9 %  injection 10 mL  10 mL Intravenous Q12H Jodi Geralds N, PA-C   10 mL at 07/20/23 1610   tamsulosin (FLOMAX) capsule 0.4 mg  0.4 mg Oral Daily Modena Slater, DO   0.4 mg at 07/20/23 9604     Discharge Medications: Please see discharge summary for a list of discharge medications.  Relevant Imaging Results:  Relevant Lab Results:   Additional Information ss#[id number].   HD patient [facility] on TTS)  Mearl Latin, LCSW

## 2023-07-20 NOTE — Discharge Planning (Signed)
[facility] Patient Discharge Orders- [facility] CLINIC: [facility]  Patient's name: [name] Admit/DC Dates: 07/18/2023 - 07/20/23  Discharge Diagnoses: Acute encephalopathy ?uremia  Hyperkalemia Asterixis/tremor - gabapentin stopped  Aranesp: Given: no   Last Hgb: 9.6 PRBC's Given: no  ESA dose for discharge: no change IV Iron dose at discharge: no  Heparin change: no  EDW Change: no   Bath Change: no  Access intervention/Change: no Details:  Hectorol/Calcitriol change: no  Discharge Labs: Calcium 7.9 Phosphorus 6.5 Albumin 2.6 K+ 4.2  IV Antibiotics: no Details:  On Coumadin?: no Last INR: Next INR: Managed By:   OTHER/APPTS/LAB ORDERS:    D/C Meds to be reconciled by nurse after every discharge.  Completed By: Virgina Norfolk, PA-C   Reviewed by: MD:______ RN_______

## 2023-07-20 NOTE — Progress Notes (Signed)
Discharge orders received. Patient to be returning to [facility] after dialysis today. Report called to [facility] from [employer].

## 2023-07-20 NOTE — Plan of Care (Signed)

## 2023-07-22 NOTE — Progress Notes (Signed)
Late Note Entry- Nov. 11, 2024  Pt was d/c on Saturday. Contacted [facility] this morning to advise clinic of pt's d/c date and that pt should resume care tomorrow.   Olivia Canter Renal Navigator [phone]

## 2023-07-23 LAB — CULTURE, BLOOD (ROUTINE X 2)
Culture: NO GROWTH
Culture: NO GROWTH
Special Requests: ADEQUATE

## 2023-07-24 DIAGNOSIS — I739 Peripheral vascular disease, unspecified: Secondary | ICD-10-CM | POA: Diagnosis not present

## 2023-07-24 DIAGNOSIS — J449 Chronic obstructive pulmonary disease, unspecified: Secondary | ICD-10-CM | POA: Diagnosis not present

## 2023-07-24 DIAGNOSIS — E119 Type 2 diabetes mellitus without complications: Secondary | ICD-10-CM | POA: Diagnosis not present

## 2023-07-24 DIAGNOSIS — G9340 Encephalopathy, unspecified: Secondary | ICD-10-CM | POA: Diagnosis not present

## 2023-07-25 DIAGNOSIS — G8929 Other chronic pain: Secondary | ICD-10-CM | POA: Diagnosis not present

## 2023-07-25 DIAGNOSIS — M1990 Unspecified osteoarthritis, unspecified site: Secondary | ICD-10-CM | POA: Diagnosis not present

## 2023-07-31 ENCOUNTER — Ambulatory Visit: Payer: Medicare HMO | Admitting: Cardiology

## 2023-07-31 DIAGNOSIS — Z4781 Encounter for orthopedic aftercare following surgical amputation: Secondary | ICD-10-CM | POA: Diagnosis not present

## 2023-07-31 DIAGNOSIS — N319 Neuromuscular dysfunction of bladder, unspecified: Secondary | ICD-10-CM | POA: Diagnosis not present

## 2023-08-01 DIAGNOSIS — N319 Neuromuscular dysfunction of bladder, unspecified: Secondary | ICD-10-CM | POA: Diagnosis not present

## 2023-08-01 DIAGNOSIS — Z4781 Encounter for orthopedic aftercare following surgical amputation: Secondary | ICD-10-CM | POA: Diagnosis not present

## 2023-08-02 DIAGNOSIS — N319 Neuromuscular dysfunction of bladder, unspecified: Secondary | ICD-10-CM | POA: Diagnosis not present

## 2023-08-02 DIAGNOSIS — Z4781 Encounter for orthopedic aftercare following surgical amputation: Secondary | ICD-10-CM | POA: Diagnosis not present

## 2023-08-03 NOTE — Progress Notes (Deleted)
Cardiology Office Note:  .   Date:  08/03/2023  ID:  [name], DOB 03-12-1956, MRN [id number] PCP: Lonie Peak, PA-C  [facility] Providers Cardiologist:  None { Click to update primary MD,subspecialty MD or APP then REFRESH:1}  History of Present Illness: .   [name] is a 67 y.o. Caucasian male patient with severe peripheral arterial disease and has history of bilateral below-knee amputation, multiple peripheral interventions in the past, uncontrolled diabetes mellitus, ESRD, severe diabetic retinopathy, coronary artery disease angioplasty to the right coronary artery on 10/29/2014, right carotid endarterectomy in 2012 , OSA Unable to tolerate CPAP, chronic back pain and neck and back surgery in past and on chronic pain medications, paroxysmal atrial fibrillation on Eliquis.   Patient is presently in the rehab facility since November 2023. He is presently in palliative care.  He has had multiple hospital admission with hypertensive emergency and also chest pain, I was scheduling him for cardiac catheterization however, after discussion with his wife, decided to do conservative therapy.  Discussed the use of AI scribe software for clinical note transcription with the patient, who gave verbal consent to proceed.  History of Present Illness            ROS  Labs   Lab Results  Component Value Date   CHOL 104 03/31/2023   HDL 23 (L) 03/31/2023   LDLCALC 54 03/31/2023   TRIG 135 03/31/2023   CHOLHDL 4.5 03/31/2023   Lab Results  Component Value Date   NA 135 07/20/2023   K 4.2 07/20/2023   CO2 24 07/20/2023   GLUCOSE 200 (H) 07/20/2023   BUN 62 (H) 07/20/2023   CREATININE 7.77 (H) 07/20/2023   CALCIUM 7.9 (L) 07/20/2023   GFR 85.57 03/11/2012   GFRNONAA 7 (L) 07/20/2023      Latest Ref Rng & Units 07/20/2023    4:00 AM 07/19/2023    5:37 AM 07/18/2023    7:27 PM  BMP  Glucose 70 - 99 mg/dL 409  811  914   BUN 8 - 23 mg/dL 62  49  41   Creatinine 0.61  - 1.24 mg/dL 7.82  9.56  2.13   Sodium 135 - 145 mmol/L 135  133  134   Potassium 3.5 - 5.1 mmol/L 4.2  4.4  4.0   Chloride 98 - 111 mmol/L 97  99  95   CO2 22 - 32 mmol/L 24  25  23    Calcium 8.9 - 10.3 mg/dL 7.9  7.8  7.8        Latest Ref Rng & Units 07/20/2023    4:00 AM 07/19/2023    5:37 AM 07/18/2023    1:53 PM  CBC  WBC 4.0 - 10.5 K/uL 4.4  5.7  7.3   Hemoglobin 13.0 - 17.0 g/dL 9.6  08.6  57.8   Hematocrit 39.0 - 52.0 % 29.2  32.4  30.7   Platelets 150 - 400 K/uL 128  121  114     External Labs:  ***  Physical Exam:   VS:  There were no vitals taken for this visit.   Wt Readings from Last 3 Encounters:  07/20/23 244 lb 0.8 oz (110.7 kg)  05/30/23 (S) 243 lb 6.2 oz (110.4 kg)  04/02/23 235 lb 14.3 oz (107 kg)     Physical Exam  Studies Reviewed: Marland Kitchen    Coronary angiogram 09/17/18:  Widely patent stent stents. Normal LVEF.    Coronary Angiogram 10/29/2014:  CAD s/p stenting of the distal, mid and proximal RCA with implantation of 3 overlapping drug-eluting stent, from distal to proximal 2.5 x 38 mm, 2.75 x 38 mm and a 2.75 x 20 mm Promus premier DES.    ECHOCARDIOGRAM COMPLETE 03/31/2023  1. Left ventricular ejection fraction, by estimation, is 60 to 65%. The left ventricle has normal function. The left ventricle has no regional wall motion abnormalities. There is mild concentric left ventricular hypertrophy. Left ventricular diastolic parameters are consistent with Grade I diastolic dysfunction (impaired relaxation). Elevated left atrial pressure. 2. Right ventricular systolic function is normal. The right ventricular size is normal. 3. Left atrial size was moderately dilated. 4. The mitral valve is normal in structure. No evidence of mitral valve regurgitation. No evidence of mitral stenosis. Moderate mitral annular calcification. 5. The aortic valve is tricuspid. Aortic valve regurgitation is not visualized. Aortic valve sclerosis/calcification is present, without any  evidence of aortic stenosis. 6. The inferior vena cava is dilated in size with >50% respiratory variability, suggesting right atrial pressure of 8 mmHg.  EKG:         EKG 04/24/2023: Sinus rhythm with fascicular block at the rate of 47 bpm, left atrial enlargement, IVCD, borderline criteria for LVH. Compared to 01/21/2023, no significant change.   Medications and allergies    Allergies  Allergen Reactions   Contrast Media [Iodinated Contrast Media] Shortness Of Breath and Other (See Comments)    Difficulty breathing and altered mental status     Ivp Dye [Iodinated Contrast Media] Anaphylaxis, Shortness Of Breath and Other (See Comments)    Breathing problems, altered mental state    Latex Rash and Other (See Comments)    A severe rash appears after the first 24 hours of being placed   Tape Other (See Comments) and Rash    Rash after 1 day of use  Do not leave on longer then 3 days with out be changed    No outpatient medications have been marked as taking for the 08/05/23 encounter (Appointment) with Yates Decamp, MD.     ASSESSMENT AND PLAN: .      ICD-10-CM   1. Coronary artery disease of native artery of native heart with stable angina pectoris (HCC)  I25.118     2. Paroxysmal atrial fibrillation (HCC)  I48.0     3. Primary hypertension  I10     4. 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 Coronary artery disease of native artery of native heart with stable angina pectoris (HCC) ***  2. Paroxysmal atrial fibrillation (HCC) ***  3. Primary hypertension ***  4. Orthostatic hypotension ***  5. Trifascicular block ***   Assessment and Plan                {Are you ordering a CV Procedure (e.g. stress test, cath, DCCV, TEE, etc)?   Press F2        :[id number]}   Signed,  Yates Decamp, MD, [facility] 08/03/2023, 2:56 PM [facility] [address] [place], [place] [address] Phone: [phone]. Fax:  [phone]

## 2023-08-05 ENCOUNTER — Ambulatory Visit: Payer: Medicare HMO | Attending: Cardiology | Admitting: Cardiology

## 2023-08-05 DIAGNOSIS — E113512 Type 2 diabetes mellitus with proliferative diabetic retinopathy with macular edema, left eye: Secondary | ICD-10-CM | POA: Diagnosis not present

## 2023-08-05 DIAGNOSIS — I480 Paroxysmal atrial fibrillation: Secondary | ICD-10-CM

## 2023-08-05 DIAGNOSIS — I453 Trifascicular block: Secondary | ICD-10-CM

## 2023-08-05 DIAGNOSIS — I25118 Atherosclerotic heart disease of native coronary artery with other forms of angina pectoris: Secondary | ICD-10-CM

## 2023-08-05 DIAGNOSIS — I10 Essential (primary) hypertension: Secondary | ICD-10-CM

## 2023-08-05 DIAGNOSIS — M1990 Unspecified osteoarthritis, unspecified site: Secondary | ICD-10-CM | POA: Diagnosis not present

## 2023-08-05 DIAGNOSIS — I951 Orthostatic hypotension: Secondary | ICD-10-CM

## 2023-08-05 DIAGNOSIS — G8929 Other chronic pain: Secondary | ICD-10-CM | POA: Diagnosis not present

## 2023-08-06 ENCOUNTER — Encounter: Payer: Self-pay | Admitting: Cardiology

## 2023-08-06 DIAGNOSIS — N319 Neuromuscular dysfunction of bladder, unspecified: Secondary | ICD-10-CM | POA: Diagnosis not present

## 2023-08-06 DIAGNOSIS — Z4781 Encounter for orthopedic aftercare following surgical amputation: Secondary | ICD-10-CM | POA: Diagnosis not present

## 2023-08-08 DIAGNOSIS — N319 Neuromuscular dysfunction of bladder, unspecified: Secondary | ICD-10-CM | POA: Diagnosis not present

## 2023-08-08 DIAGNOSIS — Z4781 Encounter for orthopedic aftercare following surgical amputation: Secondary | ICD-10-CM | POA: Diagnosis not present

## 2023-08-09 DIAGNOSIS — Z515 Encounter for palliative care: Secondary | ICD-10-CM | POA: Diagnosis not present

## 2023-08-10 DIAGNOSIS — N319 Neuromuscular dysfunction of bladder, unspecified: Secondary | ICD-10-CM | POA: Diagnosis not present

## 2023-08-10 DIAGNOSIS — E1122 Type 2 diabetes mellitus with diabetic chronic kidney disease: Secondary | ICD-10-CM | POA: Diagnosis not present

## 2023-08-10 DIAGNOSIS — Z992 Dependence on renal dialysis: Secondary | ICD-10-CM | POA: Diagnosis not present

## 2023-08-10 DIAGNOSIS — Z4781 Encounter for orthopedic aftercare following surgical amputation: Secondary | ICD-10-CM | POA: Diagnosis not present

## 2023-08-10 DIAGNOSIS — N186 End stage renal disease: Secondary | ICD-10-CM | POA: Diagnosis not present

## 2023-08-12 DIAGNOSIS — Z89512 Acquired absence of left leg below knee: Secondary | ICD-10-CM | POA: Diagnosis not present

## 2023-08-12 DIAGNOSIS — Z794 Long term (current) use of insulin: Secondary | ICD-10-CM | POA: Diagnosis not present

## 2023-08-12 DIAGNOSIS — Z961 Presence of intraocular lens: Secondary | ICD-10-CM | POA: Diagnosis not present

## 2023-08-12 DIAGNOSIS — E113512 Type 2 diabetes mellitus with proliferative diabetic retinopathy with macular edema, left eye: Secondary | ICD-10-CM | POA: Diagnosis not present

## 2023-08-12 DIAGNOSIS — Y835 Amputation of limb(s) as the cause of abnormal reaction of the patient, or of later complication, without mention of misadventure at the time of the procedure: Secondary | ICD-10-CM | POA: Diagnosis not present

## 2023-08-12 DIAGNOSIS — Z993 Dependence on wheelchair: Secondary | ICD-10-CM | POA: Diagnosis not present

## 2023-08-12 DIAGNOSIS — E113511 Type 2 diabetes mellitus with proliferative diabetic retinopathy with macular edema, right eye: Secondary | ICD-10-CM | POA: Diagnosis not present

## 2023-08-12 LAB — HM DIABETES EYE EXAM

## 2023-08-14 DIAGNOSIS — Z993 Dependence on wheelchair: Secondary | ICD-10-CM | POA: Diagnosis not present

## 2023-08-14 DIAGNOSIS — Y835 Amputation of limb(s) as the cause of abnormal reaction of the patient, or of later complication, without mention of misadventure at the time of the procedure: Secondary | ICD-10-CM | POA: Diagnosis not present

## 2023-08-14 DIAGNOSIS — Z89512 Acquired absence of left leg below knee: Secondary | ICD-10-CM | POA: Diagnosis not present

## 2023-08-16 DIAGNOSIS — E1165 Type 2 diabetes mellitus with hyperglycemia: Secondary | ICD-10-CM | POA: Diagnosis not present

## 2023-08-22 DIAGNOSIS — Z992 Dependence on renal dialysis: Secondary | ICD-10-CM | POA: Diagnosis not present

## 2023-08-22 DIAGNOSIS — G8929 Other chronic pain: Secondary | ICD-10-CM | POA: Diagnosis not present

## 2023-08-22 DIAGNOSIS — N186 End stage renal disease: Secondary | ICD-10-CM | POA: Diagnosis not present

## 2023-08-26 DIAGNOSIS — M159 Polyosteoarthritis, unspecified: Secondary | ICD-10-CM | POA: Diagnosis not present

## 2023-08-26 DIAGNOSIS — G8929 Other chronic pain: Secondary | ICD-10-CM | POA: Diagnosis not present

## 2023-09-09 DIAGNOSIS — G8929 Other chronic pain: Secondary | ICD-10-CM | POA: Diagnosis not present

## 2023-09-09 DIAGNOSIS — M159 Polyosteoarthritis, unspecified: Secondary | ICD-10-CM | POA: Diagnosis not present

## 2023-09-10 DIAGNOSIS — E1122 Type 2 diabetes mellitus with diabetic chronic kidney disease: Secondary | ICD-10-CM | POA: Diagnosis not present

## 2023-09-10 DIAGNOSIS — N186 End stage renal disease: Secondary | ICD-10-CM | POA: Diagnosis not present

## 2023-09-10 DIAGNOSIS — Z992 Dependence on renal dialysis: Secondary | ICD-10-CM | POA: Diagnosis not present

## 2023-09-16 DIAGNOSIS — Y835 Amputation of limb(s) as the cause of abnormal reaction of the patient, or of later complication, without mention of misadventure at the time of the procedure: Secondary | ICD-10-CM | POA: Diagnosis not present

## 2023-09-16 DIAGNOSIS — Z515 Encounter for palliative care: Secondary | ICD-10-CM | POA: Diagnosis not present

## 2023-09-16 DIAGNOSIS — Z89512 Acquired absence of left leg below knee: Secondary | ICD-10-CM | POA: Diagnosis not present

## 2023-09-16 DIAGNOSIS — Z89511 Acquired absence of right leg below knee: Secondary | ICD-10-CM | POA: Diagnosis not present

## 2023-09-16 DIAGNOSIS — Z993 Dependence on wheelchair: Secondary | ICD-10-CM | POA: Diagnosis not present

## 2023-09-17 DIAGNOSIS — Z993 Dependence on wheelchair: Secondary | ICD-10-CM | POA: Diagnosis not present

## 2023-09-17 DIAGNOSIS — Z89512 Acquired absence of left leg below knee: Secondary | ICD-10-CM | POA: Diagnosis not present

## 2023-09-17 DIAGNOSIS — Z89511 Acquired absence of right leg below knee: Secondary | ICD-10-CM | POA: Diagnosis not present

## 2023-09-17 DIAGNOSIS — Y835 Amputation of limb(s) as the cause of abnormal reaction of the patient, or of later complication, without mention of misadventure at the time of the procedure: Secondary | ICD-10-CM | POA: Diagnosis not present

## 2023-09-18 DIAGNOSIS — Y835 Amputation of limb(s) as the cause of abnormal reaction of the patient, or of later complication, without mention of misadventure at the time of the procedure: Secondary | ICD-10-CM | POA: Diagnosis not present

## 2023-09-18 DIAGNOSIS — Z89512 Acquired absence of left leg below knee: Secondary | ICD-10-CM | POA: Diagnosis not present

## 2023-09-18 DIAGNOSIS — Z993 Dependence on wheelchair: Secondary | ICD-10-CM | POA: Diagnosis not present

## 2023-09-18 DIAGNOSIS — Z89511 Acquired absence of right leg below knee: Secondary | ICD-10-CM | POA: Diagnosis not present

## 2023-09-19 DIAGNOSIS — Z89511 Acquired absence of right leg below knee: Secondary | ICD-10-CM | POA: Diagnosis not present

## 2023-09-19 DIAGNOSIS — Y835 Amputation of limb(s) as the cause of abnormal reaction of the patient, or of later complication, without mention of misadventure at the time of the procedure: Secondary | ICD-10-CM | POA: Diagnosis not present

## 2023-09-19 DIAGNOSIS — I10 Essential (primary) hypertension: Secondary | ICD-10-CM | POA: Diagnosis not present

## 2023-09-19 DIAGNOSIS — Z89512 Acquired absence of left leg below knee: Secondary | ICD-10-CM | POA: Diagnosis not present

## 2023-09-19 DIAGNOSIS — Z993 Dependence on wheelchair: Secondary | ICD-10-CM | POA: Diagnosis not present

## 2023-09-20 DIAGNOSIS — Z89511 Acquired absence of right leg below knee: Secondary | ICD-10-CM | POA: Diagnosis not present

## 2023-09-20 DIAGNOSIS — Z89512 Acquired absence of left leg below knee: Secondary | ICD-10-CM | POA: Diagnosis not present

## 2023-09-20 DIAGNOSIS — Z993 Dependence on wheelchair: Secondary | ICD-10-CM | POA: Diagnosis not present

## 2023-09-20 DIAGNOSIS — Y835 Amputation of limb(s) as the cause of abnormal reaction of the patient, or of later complication, without mention of misadventure at the time of the procedure: Secondary | ICD-10-CM | POA: Diagnosis not present

## 2023-09-22 ENCOUNTER — Emergency Department (HOSPITAL_COMMUNITY): Payer: Medicare HMO

## 2023-09-22 ENCOUNTER — Encounter (HOSPITAL_COMMUNITY): Payer: Self-pay

## 2023-09-22 ENCOUNTER — Inpatient Hospital Stay (HOSPITAL_COMMUNITY)
Admission: EM | Admit: 2023-09-22 | Discharge: 2023-09-25 | DRG: 871 | Disposition: A | Discharge status: Skilled Nursing Facility | Payer: Medicare HMO | Source: Skilled Nursing Facility | Attending: Internal Medicine | Admitting: Internal Medicine

## 2023-09-22 ENCOUNTER — Other Ambulatory Visit: Payer: Self-pay

## 2023-09-22 DIAGNOSIS — R509 Fever, unspecified: Secondary | ICD-10-CM | POA: Diagnosis not present

## 2023-09-22 DIAGNOSIS — A0472 Enterocolitis due to Clostridium difficile, not specified as recurrent: Secondary | ICD-10-CM | POA: Diagnosis present

## 2023-09-22 DIAGNOSIS — M549 Dorsalgia, unspecified: Secondary | ICD-10-CM | POA: Diagnosis present

## 2023-09-22 DIAGNOSIS — G8929 Other chronic pain: Secondary | ICD-10-CM | POA: Diagnosis present

## 2023-09-22 DIAGNOSIS — Z800 Family history of malignant neoplasm of digestive organs: Secondary | ICD-10-CM

## 2023-09-22 DIAGNOSIS — K760 Fatty (change of) liver, not elsewhere classified: Secondary | ICD-10-CM | POA: Diagnosis present

## 2023-09-22 DIAGNOSIS — E119 Type 2 diabetes mellitus without complications: Secondary | ICD-10-CM | POA: Diagnosis not present

## 2023-09-22 DIAGNOSIS — I120 Hypertensive chronic kidney disease with stage 5 chronic kidney disease or end stage renal disease: Secondary | ICD-10-CM | POA: Diagnosis present

## 2023-09-22 DIAGNOSIS — R7989 Other specified abnormal findings of blood chemistry: Secondary | ICD-10-CM | POA: Diagnosis present

## 2023-09-22 DIAGNOSIS — J449 Chronic obstructive pulmonary disease, unspecified: Secondary | ICD-10-CM | POA: Diagnosis present

## 2023-09-22 DIAGNOSIS — N186 End stage renal disease: Secondary | ICD-10-CM | POA: Diagnosis not present

## 2023-09-22 DIAGNOSIS — Z7901 Long term (current) use of anticoagulants: Secondary | ICD-10-CM

## 2023-09-22 DIAGNOSIS — Z8249 Family history of ischemic heart disease and other diseases of the circulatory system: Secondary | ICD-10-CM

## 2023-09-22 DIAGNOSIS — Z955 Presence of coronary angioplasty implant and graft: Secondary | ICD-10-CM

## 2023-09-22 DIAGNOSIS — R6510 Systemic inflammatory response syndrome (SIRS) of non-infectious origin without acute organ dysfunction: Secondary | ICD-10-CM | POA: Diagnosis not present

## 2023-09-22 DIAGNOSIS — Z8261 Family history of arthritis: Secondary | ICD-10-CM

## 2023-09-22 DIAGNOSIS — Z992 Dependence on renal dialysis: Secondary | ICD-10-CM

## 2023-09-22 DIAGNOSIS — R051 Acute cough: Secondary | ICD-10-CM | POA: Diagnosis not present

## 2023-09-22 DIAGNOSIS — Z8042 Family history of malignant neoplasm of prostate: Secondary | ICD-10-CM

## 2023-09-22 DIAGNOSIS — A0811 Acute gastroenteropathy due to Norwalk agent: Secondary | ICD-10-CM | POA: Diagnosis present

## 2023-09-22 DIAGNOSIS — R531 Weakness: Secondary | ICD-10-CM | POA: Diagnosis not present

## 2023-09-22 DIAGNOSIS — E1122 Type 2 diabetes mellitus with diabetic chronic kidney disease: Secondary | ICD-10-CM | POA: Diagnosis present

## 2023-09-22 DIAGNOSIS — Z83438 Family history of other disorder of lipoprotein metabolism and other lipidemia: Secondary | ICD-10-CM

## 2023-09-22 DIAGNOSIS — Z89512 Acquired absence of left leg below knee: Secondary | ICD-10-CM

## 2023-09-22 DIAGNOSIS — Z79899 Other long term (current) drug therapy: Secondary | ICD-10-CM

## 2023-09-22 DIAGNOSIS — I10 Essential (primary) hypertension: Secondary | ICD-10-CM | POA: Diagnosis present

## 2023-09-22 DIAGNOSIS — I2510 Atherosclerotic heart disease of native coronary artery without angina pectoris: Secondary | ICD-10-CM | POA: Diagnosis present

## 2023-09-22 DIAGNOSIS — I700 Atherosclerosis of aorta: Secondary | ICD-10-CM | POA: Diagnosis not present

## 2023-09-22 DIAGNOSIS — E1142 Type 2 diabetes mellitus with diabetic polyneuropathy: Secondary | ICD-10-CM | POA: Diagnosis present

## 2023-09-22 DIAGNOSIS — Z803 Family history of malignant neoplasm of breast: Secondary | ICD-10-CM

## 2023-09-22 DIAGNOSIS — A419 Sepsis, unspecified organism: Secondary | ICD-10-CM | POA: Diagnosis not present

## 2023-09-22 DIAGNOSIS — I739 Peripheral vascular disease, unspecified: Secondary | ICD-10-CM | POA: Diagnosis present

## 2023-09-22 DIAGNOSIS — G894 Chronic pain syndrome: Secondary | ICD-10-CM | POA: Diagnosis present

## 2023-09-22 DIAGNOSIS — Z96651 Presence of right artificial knee joint: Secondary | ICD-10-CM | POA: Diagnosis present

## 2023-09-22 DIAGNOSIS — Z89511 Acquired absence of right leg below knee: Secondary | ICD-10-CM

## 2023-09-22 DIAGNOSIS — Z1152 Encounter for screening for COVID-19: Secondary | ICD-10-CM

## 2023-09-22 DIAGNOSIS — Z743 Need for continuous supervision: Secondary | ICD-10-CM | POA: Diagnosis not present

## 2023-09-22 DIAGNOSIS — D631 Anemia in chronic kidney disease: Secondary | ICD-10-CM | POA: Diagnosis present

## 2023-09-22 DIAGNOSIS — I959 Hypotension, unspecified: Secondary | ICD-10-CM | POA: Diagnosis not present

## 2023-09-22 DIAGNOSIS — Z794 Long term (current) use of insulin: Secondary | ICD-10-CM

## 2023-09-22 DIAGNOSIS — I480 Paroxysmal atrial fibrillation: Secondary | ICD-10-CM | POA: Diagnosis not present

## 2023-09-22 DIAGNOSIS — Z91041 Radiographic dye allergy status: Secondary | ICD-10-CM

## 2023-09-22 DIAGNOSIS — Z888 Allergy status to other drugs, medicaments and biological substances status: Secondary | ICD-10-CM

## 2023-09-22 DIAGNOSIS — E785 Hyperlipidemia, unspecified: Secondary | ICD-10-CM | POA: Diagnosis present

## 2023-09-22 DIAGNOSIS — Z9049 Acquired absence of other specified parts of digestive tract: Secondary | ICD-10-CM

## 2023-09-22 DIAGNOSIS — L89309 Pressure ulcer of unspecified buttock, unspecified stage: Secondary | ICD-10-CM | POA: Diagnosis present

## 2023-09-22 DIAGNOSIS — K219 Gastro-esophageal reflux disease without esophagitis: Secondary | ICD-10-CM | POA: Diagnosis present

## 2023-09-22 DIAGNOSIS — Z91048 Other nonmedicinal substance allergy status: Secondary | ICD-10-CM

## 2023-09-22 DIAGNOSIS — E1151 Type 2 diabetes mellitus with diabetic peripheral angiopathy without gangrene: Secondary | ICD-10-CM | POA: Diagnosis present

## 2023-09-22 DIAGNOSIS — Z91040 Latex allergy status: Secondary | ICD-10-CM

## 2023-09-22 DIAGNOSIS — Z833 Family history of diabetes mellitus: Secondary | ICD-10-CM

## 2023-09-22 DIAGNOSIS — L89152 Pressure ulcer of sacral region, stage 2: Secondary | ICD-10-CM | POA: Diagnosis present

## 2023-09-22 DIAGNOSIS — F17210 Nicotine dependence, cigarettes, uncomplicated: Secondary | ICD-10-CM | POA: Diagnosis present

## 2023-09-22 DIAGNOSIS — R0902 Hypoxemia: Secondary | ICD-10-CM | POA: Diagnosis not present

## 2023-09-22 DIAGNOSIS — Z8673 Personal history of transient ischemic attack (TIA), and cerebral infarction without residual deficits: Secondary | ICD-10-CM

## 2023-09-22 LAB — CBC WITH DIFFERENTIAL/PLATELET
Abs Immature Granulocytes: 0.04 K/uL (ref 0.00–0.07)
Basophils Absolute: 0 K/uL (ref 0.0–0.1)
Basophils Relative: 0 %
Eosinophils Absolute: 0.1 K/uL (ref 0.0–0.5)
Eosinophils Relative: 1 %
HCT: 35.4 % — ABNORMAL LOW (ref 39.0–52.0)
Hemoglobin: 11.7 g/dL — ABNORMAL LOW (ref 13.0–17.0)
Immature Granulocytes: 0 %
Lymphocytes Relative: 3 %
Lymphs Abs: 0.3 K/uL — ABNORMAL LOW (ref 0.7–4.0)
MCH: 31 pg (ref 26.0–34.0)
MCHC: 33.1 g/dL (ref 30.0–36.0)
MCV: 93.9 fL (ref 80.0–100.0)
Monocytes Absolute: 0.5 K/uL (ref 0.1–1.0)
Monocytes Relative: 6 %
Neutro Abs: 8.3 K/uL — ABNORMAL HIGH (ref 1.7–7.7)
Neutrophils Relative %: 90 %
Platelets: 155 K/uL (ref 150–400)
RBC: 3.77 MIL/uL — ABNORMAL LOW (ref 4.22–5.81)
RDW: 13.6 % (ref 11.5–15.5)
WBC: 9.3 K/uL (ref 4.0–10.5)
nRBC: 0 % (ref 0.0–0.2)

## 2023-09-22 LAB — CBC
HCT: 33.4 % — ABNORMAL LOW (ref 39.0–52.0)
Hemoglobin: 10.9 g/dL — ABNORMAL LOW (ref 13.0–17.0)
MCH: 31.1 pg (ref 26.0–34.0)
MCHC: 32.6 g/dL (ref 30.0–36.0)
MCV: 95.4 fL (ref 80.0–100.0)
Platelets: 149 10*3/uL — ABNORMAL LOW (ref 150–400)
RBC: 3.5 MIL/uL — ABNORMAL LOW (ref 4.22–5.81)
RDW: 13.7 % (ref 11.5–15.5)
WBC: 7.6 10*3/uL (ref 4.0–10.5)
nRBC: 0 % (ref 0.0–0.2)

## 2023-09-22 LAB — BASIC METABOLIC PANEL
Anion gap: 10 (ref 5–15)
BUN: 35 mg/dL — ABNORMAL HIGH (ref 8–23)
CO2: 28 mmol/L (ref 22–32)
Calcium: 7.8 mg/dL — ABNORMAL LOW (ref 8.9–10.3)
Chloride: 99 mmol/L (ref 98–111)
Creatinine, Ser: 4.66 mg/dL — ABNORMAL HIGH (ref 0.61–1.24)
GFR, Estimated: 13 mL/min — ABNORMAL LOW (ref >60)
Glucose, Bld: 140 mg/dL — ABNORMAL HIGH (ref 70–99)
Potassium: 4 mmol/L (ref 3.5–5.1)
Sodium: 137 mmol/L (ref 135–145)

## 2023-09-22 LAB — URINALYSIS, ROUTINE W REFLEX MICROSCOPIC
Bilirubin Urine: NEGATIVE
Glucose, UA: 150 mg/dL — AB
Hgb urine dipstick: NEGATIVE
Ketones, ur: NEGATIVE mg/dL
Nitrite: NEGATIVE
Protein, ur: >=300 mg/dL — AB
Specific Gravity, Urine: 1.013 (ref 1.005–1.030)
pH: 7 (ref 5.0–8.0)

## 2023-09-22 LAB — COMPREHENSIVE METABOLIC PANEL WITH GFR
ALT: 17 U/L (ref 0–44)
AST: 19 U/L (ref 15–41)
Albumin: 2.9 g/dL — ABNORMAL LOW (ref 3.5–5.0)
Alkaline Phosphatase: 96 U/L (ref 38–126)
Anion gap: 13 (ref 5–15)
BUN: 28 mg/dL — ABNORMAL HIGH (ref 8–23)
CO2: 29 mmol/L (ref 22–32)
Calcium: 8.1 mg/dL — ABNORMAL LOW (ref 8.9–10.3)
Chloride: 97 mmol/L — ABNORMAL LOW (ref 98–111)
Creatinine, Ser: 4.27 mg/dL — ABNORMAL HIGH (ref 0.61–1.24)
GFR, Estimated: 14 mL/min — ABNORMAL LOW
Glucose, Bld: 172 mg/dL — ABNORMAL HIGH (ref 70–99)
Potassium: 4.2 mmol/L (ref 3.5–5.1)
Sodium: 139 mmol/L (ref 135–145)
Total Bilirubin: 0.5 mg/dL (ref 0.0–1.2)
Total Protein: 6.1 g/dL — ABNORMAL LOW (ref 6.5–8.1)

## 2023-09-22 LAB — RESP PANEL BY RT-PCR (RSV, FLU A&B, COVID)  RVPGX2
Influenza A by PCR: NEGATIVE
Influenza B by PCR: NEGATIVE
Resp Syncytial Virus by PCR: NEGATIVE
SARS Coronavirus 2 by RT PCR: NEGATIVE

## 2023-09-22 LAB — I-STAT CG4 LACTIC ACID, ED: Lactic Acid, Venous: 1 mmol/L (ref 0.5–1.9)

## 2023-09-22 LAB — GLUCOSE, CAPILLARY
Glucose-Capillary: 117 mg/dL — ABNORMAL HIGH (ref 70–99)
Glucose-Capillary: 146 mg/dL — ABNORMAL HIGH (ref 70–99)

## 2023-09-22 LAB — CBG MONITORING, ED: Glucose-Capillary: 165 mg/dL — ABNORMAL HIGH (ref 70–99)

## 2023-09-22 MED ORDER — ACETAMINOPHEN 500 MG PO TABS
1000.0000 mg | ORAL_TABLET | Freq: Once | ORAL | Status: AC
  Administered 2023-09-22: 1000 mg via ORAL
  Filled 2023-09-22: qty 2

## 2023-09-22 MED ORDER — OXYCODONE-ACETAMINOPHEN 5-325 MG PO TABS
1.0000 | ORAL_TABLET | Freq: Three times a day (TID) | ORAL | Status: DC | PRN
  Administered 2023-09-22 – 2023-09-25 (×6): 1 via ORAL
  Filled 2023-09-22 (×6): qty 1

## 2023-09-22 MED ORDER — LABETALOL HCL 200 MG PO TABS
200.0000 mg | ORAL_TABLET | Freq: Two times a day (BID) | ORAL | Status: DC
  Administered 2023-09-22 – 2023-09-25 (×5): 200 mg via ORAL
  Filled 2023-09-22 (×6): qty 1

## 2023-09-22 MED ORDER — ATORVASTATIN CALCIUM 40 MG PO TABS
40.0000 mg | ORAL_TABLET | Freq: Every day | ORAL | Status: DC
  Administered 2023-09-22 – 2023-09-24 (×3): 40 mg via ORAL
  Filled 2023-09-22 (×3): qty 1

## 2023-09-22 MED ORDER — ISOSORBIDE MONONITRATE ER 60 MG PO TB24
60.0000 mg | ORAL_TABLET | Freq: Every day | ORAL | Status: DC
  Administered 2023-09-22 – 2023-09-25 (×4): 60 mg via ORAL
  Filled 2023-09-22 (×2): qty 1
  Filled 2023-09-22: qty 2
  Filled 2023-09-22: qty 1

## 2023-09-22 MED ORDER — OXYCODONE HCL 5 MG PO TABS
5.0000 mg | ORAL_TABLET | Freq: Once | ORAL | Status: AC
  Administered 2023-09-22: 5 mg via ORAL
  Filled 2023-09-22: qty 1

## 2023-09-22 MED ORDER — HYDRALAZINE HCL 50 MG PO TABS
50.0000 mg | ORAL_TABLET | Freq: Three times a day (TID) | ORAL | Status: DC
  Administered 2023-09-22 – 2023-09-25 (×8): 50 mg via ORAL
  Filled 2023-09-22 (×5): qty 1
  Filled 2023-09-22: qty 2
  Filled 2023-09-22 (×2): qty 1
  Filled 2023-09-22: qty 2

## 2023-09-22 MED ORDER — SODIUM CHLORIDE 0.9 % IV SOLN: 500.0000 mg | Freq: Once | INTRAVENOUS | Status: DC

## 2023-09-22 MED ORDER — APIXABAN 5 MG PO TABS
5.0000 mg | ORAL_TABLET | Freq: Two times a day (BID) | ORAL | Status: DC
  Administered 2023-09-22 – 2023-09-25 (×7): 5 mg via ORAL
  Filled 2023-09-22 (×7): qty 1

## 2023-09-22 MED ORDER — SODIUM CHLORIDE 0.9% FLUSH
3.0000 mL | Freq: Two times a day (BID) | INTRAVENOUS | Status: DC
  Administered 2023-09-22 – 2023-09-24 (×6): 3 mL via INTRAVENOUS

## 2023-09-22 MED ORDER — VANCOMYCIN HCL IN DEXTROSE 1-5 GM/200ML-% IV SOLN: 1000.0000 mg | INTRAVENOUS | Status: DC

## 2023-09-22 MED ORDER — GABAPENTIN 100 MG PO CAPS
100.0000 mg | ORAL_CAPSULE | Freq: Three times a day (TID) | ORAL | Status: DC
  Administered 2023-09-22 – 2023-09-25 (×10): 100 mg via ORAL
  Filled 2023-09-22 (×10): qty 1

## 2023-09-22 MED ORDER — ROPINIROLE HCL 1 MG PO TABS
2.0000 mg | ORAL_TABLET | Freq: Every day | ORAL | Status: DC
  Administered 2023-09-22 – 2023-09-24 (×3): 2 mg via ORAL
  Filled 2023-09-22 (×3): qty 2

## 2023-09-22 MED ORDER — TAMSULOSIN HCL 0.4 MG PO CAPS
0.4000 mg | ORAL_CAPSULE | Freq: Every day | ORAL | Status: DC
  Administered 2023-09-22 – 2023-09-25 (×4): 0.4 mg via ORAL
  Filled 2023-09-22 (×4): qty 1

## 2023-09-22 MED ORDER — FAMOTIDINE 20 MG PO TABS
20.0000 mg | ORAL_TABLET | ORAL | Status: DC
  Administered 2023-09-22 – 2023-09-24 (×2): 20 mg via ORAL
  Filled 2023-09-22 (×2): qty 1

## 2023-09-22 MED ORDER — SENNOSIDES-DOCUSATE SODIUM 8.6-50 MG PO TABS
2.0000 | ORAL_TABLET | Freq: Every day | ORAL | Status: DC
  Administered 2023-09-23: 2 via ORAL
  Filled 2023-09-22 (×2): qty 2

## 2023-09-22 MED ORDER — AMIODARONE HCL 200 MG PO TABS
200.0000 mg | ORAL_TABLET | Freq: Every day | ORAL | Status: DC
  Administered 2023-09-22 – 2023-09-25 (×4): 200 mg via ORAL
  Filled 2023-09-22 (×4): qty 1

## 2023-09-22 MED ORDER — METRONIDAZOLE 500 MG/100ML IV SOLN
500.0000 mg | Freq: Two times a day (BID) | INTRAVENOUS | Status: DC
  Administered 2023-09-22 – 2023-09-23 (×3): 500 mg via INTRAVENOUS
  Filled 2023-09-22 (×3): qty 100

## 2023-09-22 MED ORDER — SODIUM CHLORIDE 0.9 % IV SOLN
2.0000 g | Freq: Once | INTRAVENOUS | Status: AC
  Administered 2023-09-22: 2 g via INTRAVENOUS
  Filled 2023-09-22: qty 12.5

## 2023-09-22 MED ORDER — GERHARDT'S BUTT CREAM
TOPICAL_CREAM | Freq: Every day | CUTANEOUS | Status: DC
  Filled 2023-09-22 (×2): qty 60

## 2023-09-22 MED ORDER — INSULIN ASPART 100 UNIT/ML IJ SOLN
0.0000 [IU] | Freq: Three times a day (TID) | INTRAMUSCULAR | Status: DC
  Administered 2023-09-22: 1 [IU] via SUBCUTANEOUS

## 2023-09-22 MED ORDER — FENTANYL 25 MCG/HR TD PT72
1.0000 | MEDICATED_PATCH | TRANSDERMAL | Status: DC
  Administered 2023-09-22 – 2023-09-25 (×2): 1 via TRANSDERMAL
  Filled 2023-09-22 (×2): qty 1

## 2023-09-22 MED ORDER — VANCOMYCIN HCL 2000 MG/400ML IV SOLN
2000.0000 mg | Freq: Once | INTRAVENOUS | Status: AC
  Administered 2023-09-22: 2000 mg via INTRAVENOUS
  Filled 2023-09-22: qty 400

## 2023-09-22 MED ORDER — AMLODIPINE BESYLATE 10 MG PO TABS
10.0000 mg | ORAL_TABLET | Freq: Every day | ORAL | Status: DC
Start: 2023-09-22 — End: 2023-09-25
  Administered 2023-09-22 – 2023-09-25 (×4): 10 mg via ORAL
  Filled 2023-09-22 (×3): qty 1
  Filled 2023-09-22: qty 2

## 2023-09-22 MED ORDER — ACETAMINOPHEN 650 MG RE SUPP: 650.0000 mg | Freq: Four times a day (QID) | RECTAL | Status: DC | PRN

## 2023-09-22 MED ORDER — ACETAMINOPHEN 325 MG PO TABS: 650.0000 mg | ORAL_TABLET | Freq: Four times a day (QID) | ORAL | Status: DC | PRN

## 2023-09-22 MED ORDER — INSULIN ASPART 100 UNIT/ML IJ SOLN: 0.0000 [IU] | Freq: Every day | INTRAMUSCULAR | Status: DC

## 2023-09-22 MED ORDER — SODIUM CHLORIDE 0.9 % IV SOLN
1.0000 g | INTRAVENOUS | Status: DC
  Administered 2023-09-23: 1 g via INTRAVENOUS
  Filled 2023-09-22: qty 10

## 2023-09-22 MED ORDER — SODIUM CHLORIDE 0.9 % IV SOLN
1.0000 g | Freq: Once | INTRAVENOUS | Status: DC
  Filled 2023-09-22: qty 10

## 2023-09-22 NOTE — ED Triage Notes (Signed)
 Pt bib EMS from [facility] for generalized weakness. EMS reports that pt went to dialysis today and when he returned, he was lethargic. EMS also reports that pt has a wound on his buttocks.

## 2023-09-22 NOTE — Care Management (Signed)
 Spoke to wife regarding MOON since patient is ALOC and lethargic. She wants to speak with a Recruitment consultant. Sent a message to Mallory Shirk regarding  this.

## 2023-09-22 NOTE — Care Management Obs Status (Signed)
 MEDICARE OBSERVATION STATUS NOTIFICATION   Patient Details  Name: [name] MRN: [id number] Date of Birth: 12-13-1955   Medicare Observation Status Notification Given:  Yes    Lockie Pares, RN 09/22/2023, 3:24 PM

## 2023-09-22 NOTE — ED Notes (Signed)
 Wife called wanting an update [phone]

## 2023-09-22 NOTE — Progress Notes (Signed)
 Pharmacy Antibiotic Note  [name] is a 68 y.o. male with ESRD on HD TTSat admitted from SNF on 09/22/2023 with AMS/sepsis.  Pharmacy has been consulted for Vancomycin  and Cefepime   dosing.  Plan: Vancomycin  2000 mg IV now, then 1000 mg IV after each HD Cefepime  1 g IV daily  Height: 6' 2 (188 cm) Weight: 122.5 kg (270 lb) IBW/kg (Calculated) : 82.2  Temp (24hrs), Avg:100.9 F (38.3 C), Min:99.6 F (37.6 C), Max:102.2 F (39 C)  Recent Labs  Lab 09/22/23 0123 09/22/23 0149  WBC 9.3  --   CREATININE 4.27*  --   LATICACIDVEN  --  1.0    Estimated Creatinine Clearance: 22.7 mL/min (A) (by C-G formula based on SCr of 4.27 mg/dL (H)).    Allergies  Allergen Reactions   Contrast Media [Iodinated Contrast Media] Shortness Of Breath and Other (See Comments)    Difficulty breathing and altered mental status     Ivp Dye [Iodinated Contrast Media] Anaphylaxis, Shortness Of Breath and Other (See Comments)    Breathing problems, altered mental state    Latex Rash and Other (See Comments)    A severe rash appears after the first 24 hours of being placed   Tape Other (See Comments) and Rash    Rash after 1 day of use  Do not leave on longer then 3 days with out be changed     Dail Cordella Misty 09/22/2023 5:54 AM

## 2023-09-22 NOTE — ED Provider Notes (Addendum)
 [facility] AT [facility] Provider Note   CSN: [id number] Arrival date & time: 09/22/23  0111     History  Chief Complaint  Patient presents with   Weakness    [name] is a 68 y.o. male.  The history is provided by the patient and medical records.  Weakness  68 year old male with history of ESRD on HD, diabetes, peripheral vascular disease status post bilateral BKA's, presenting to the ED with altered mental status.  He is a resident at [facility], went to dialysis today and upon returning he was found to be lethargic and warm to the touch.  Did not have a fever with EMS.  He does have a wound on his buttocks.  He reports some cough recently.  Denies any known COVID or flu outbreak in facility.  He is on daily Eliquis .  No falls or head trauma reported.  Home Medications Prior to Admission medications   Medication Sig Start Date End Date Taking? Authorizing Provider  amiodarone  (PACERONE ) 200 MG tablet Take 1 tablet (200 mg total) by mouth daily. 10/29/22   Ladona Heinz, MD  amLODipine  (NORVASC ) 10 MG tablet Take 10 mg by mouth daily.    [provider]  apixaban  (ELIQUIS ) 5 MG TABS tablet Take 1 tablet (5 mg total) by mouth 2 (two) times daily. 01/14/22 07/18/23  Gonfa, Taye T, MD  atorvastatin  (LIPITOR ) 40 MG tablet Take 40 mg by mouth at bedtime.     [provider]  B Complex-C-Zn-Folic Acid  (DIALYVITE /ZINC ) TABS Take 1 tablet by mouth Every Tuesday,Thursday,and Saturday with dialysis. 10/04/21   [provider]  docusate sodium  (COLACE) 100 MG capsule Take 100 mg by mouth 2 (two) times daily.    [provider]  famotidine  (PEPCID ) 20 MG tablet Take 20 mg by mouth every other day.    [provider]  fentaNYL  (DURAGESIC ) 25 MCG/HR Place 1 patch onto the skin every 3 (three) days. 06/01/23   Ghimire, Donalda HERO, MD  folic acid  (FOLVITE ) 1 MG tablet Take 1 mg by mouth daily. 09/18/21   [provider]   gabapentin  (NEURONTIN ) 100 MG capsule Take 100 mg by mouth 3 (three) times daily.    [provider]  hydrALAZINE  (APRESOLINE ) 50 MG tablet Take 1 tablet (50 mg total) by mouth every 8 (eight) hours. 04/01/23   Briana Elgin LABOR, MD  Icosapent  Ethyl 0.5 g CAPS Take 1 capsule by mouth daily.    [provider]  insulin  aspart (NOVOLOG  FLEXPEN) 100 UNIT/ML FlexPen Inject 0-12 Units into the skin See admin instructions. Inject 4 units into the skin with meals in addition to sliding scale as needed. Inject as per sliding scale: If 0 - 69 = 0 initiate hypoglycemia orders;  70 - 150 = 0;  151 - 200 = 2;  201 - 250 = 4;  251 - 300 = 6;  301 - 350 = 8;  351 - 400 = 10;  401 - 450 = 12.  If >400, give 12 units, recheck in 1 hour. If still >400, call MD/NP for further orders. Subcutaneously with meals in addition to scheduled 4 unites with meals.    [provider]  insulin  aspart (NOVOLOG ) 100 UNIT/ML injection Inject 4 Units into the skin 3 (three) times daily with meals.    [provider]  isosorbide  mononitrate (IMDUR ) 60 MG 24 hr tablet Take 1 tablet (60 mg total) by mouth daily. 01/21/23 07/18/23  Ladona,  Gordy, MD  labetalol  (NORMODYNE ) 200 MG tablet Take 1 tablet (200 mg total) by mouth 2 (two) times daily. 04/01/23   Briana Elgin LABOR, MD  lidocaine  (LIDODERM ) 5 % Place 1 patch onto the skin daily as needed. Remove & Discard patch within 12 hours or as directed by MD 07/20/23   Tobie Gaines, DO  lidocaine -prilocaine  (EMLA ) cream Apply 1 application topically See admin instructions. Prior to Dialysis days Tuesday,Thursday and saturday 06/06/21   [provider]  loratadine  (CLARITIN ) 10 MG tablet Take 10 mg by mouth daily.    [provider]  midodrine  (PROAMATINE ) 10 MG tablet Take 10 mg by mouth daily as needed (SBP less than 100.).    [provider]  NEEDLE, REUSABLE, 22 G 22G X 1-1/2 MISC 1 Units by Does not apply route as directed. For B12  IM inj 02/15/18   Elgergawy, Brayton RAMAN, MD  nitroGLYCERIN  (NITROSTAT ) 0.4 MG SL tablet Place 0.4 mg under the tongue every 5 (five) minutes as needed for chest pain. 01/06/22   [provider]  Nystatin ([name]'S BUTT CREAM) CREA Apply 1 Application topically 3 (three) times daily as needed for irritation. 07/20/23   Tobie Gaines, DO  ondansetron  (ZOFRAN ) 4 MG tablet Take 1 tablet (4 mg total) by mouth every 8 (eight) hours as needed for nausea or vomiting. 04/24/23   Ladona Gordy, MD  oxyCODONE -acetaminophen  (PERCOCET /ROXICET) 5-325 MG tablet Take 1 tablet by mouth every 8 (eight) hours as needed for severe pain. 05/31/23   Ghimire, Donalda HERO, MD  rOPINIRole  (REQUIP ) 2 MG tablet Take 2 mg by mouth at bedtime.    [provider]  senna-docusate (SENOKOT-S) 8.6-50 MG tablet Take 2 tablets by mouth at bedtime.    [provider]  Syringe/Needle, Disp, (SYRINGE 3CC/22GX1-1/2) 22G X 1-1/2 3 ML MISC 1 Syringe by Does not apply route as directed. For b12 IM inj 02/15/18   Elgergawy, Brayton RAMAN, MD  tamsulosin  (FLOMAX ) 0.4 MG CAPS capsule Take 0.4 mg by mouth daily.    [provider]      Allergies    Contrast media [iodinated contrast media], Ivp dye [iodinated contrast media], Latex, and Tape    Review of Systems   Review of Systems  Neurological:  Positive for weakness.  All other systems reviewed and are negative.   Physical Exam Updated Vital Signs BP (!) 109/53   Pulse 77   Temp (!) 100.8 F (38.2 C) (Rectal)   Resp 17   Ht 6' 2 (1.88 m)   Wt 122.5 kg   SpO2 98%   BMI 34.67 kg/m   Physical Exam Vitals and nursing note reviewed.  Constitutional:      Appearance: He is well-developed.     Comments: Very warm to touch  HENT:     Head: Normocephalic and atraumatic.  Eyes:     Conjunctiva/sclera: Conjunctivae normal.     Pupils: Pupils are equal, round, and reactive to light.  Cardiovascular:     Rate and Rhythm: Normal rate and regular rhythm.      Heart sounds: Normal heart sounds.  Pulmonary:     Effort: Pulmonary effort is normal.     Breath sounds: Normal breath sounds.  Abdominal:     General: Bowel sounds are normal.     Palpations: Abdomen is soft.  Genitourinary:    Comments: Stage 1 decubitus ulcer of sacral area, appears clean and wound paste present, no bleeding, no drainage Musculoskeletal:  General: Normal range of motion.     Cervical back: Normal range of motion.     Comments: Bilateral BKA's Fistula LUE-- bandaged, thrill present  Skin:    General: Skin is warm and dry.  Neurological:     Mental Status: He is alert and oriented to person, place, and time.     Comments: AAOx3, answering questions and following commands appropriately     ED Results / Procedures / Treatments   Labs (all labs ordered are listed, but only abnormal results are displayed) Labs Reviewed  CBC WITH DIFFERENTIAL/PLATELET - Abnormal; Notable for the following components:      Result Value   RBC 3.77 (*)    Hemoglobin 11.7 (*)    HCT 35.4 (*)    Neutro Abs 8.3 (*)    Lymphs Abs 0.3 (*)    All other components within normal limits  COMPREHENSIVE METABOLIC PANEL - Abnormal; Notable for the following components:   Chloride 97 (*)    Glucose, Bld 172 (*)    BUN 28 (*)    Creatinine, Ser 4.27 (*)    Calcium  8.1 (*)    Total Protein 6.1 (*)    Albumin  2.9 (*)    GFR, Estimated 14 (*)    All other components within normal limits  RESP PANEL BY RT-PCR (RSV, FLU A&B, COVID)  RVPGX2  CULTURE, BLOOD (ROUTINE X 2)  CULTURE, BLOOD (ROUTINE X 2)  I-STAT CG4 LACTIC ACID, ED    EKG None  Radiology DG Chest Port 1 View Result Date: 09/22/2023 CLINICAL DATA:  Sepsis EXAM: PORTABLE CHEST 1 VIEW COMPARISON:  CT chest dated 07/18/2023 FINDINGS: Mild bibasilar atelectasis/scarring. No pleural effusion or pneumothorax. The heart is normal in size.  Thoracic aortic calcifications. IMPRESSION: No acute cardiopulmonary disease. Mild  bibasilar atelectasis/scarring. Electronically Signed   By: Pinkie Pebbles M.D.   On: 09/22/2023 02:37    Procedures Procedures    CRITICAL CARE Performed by: Olam CHRISTELLA Slocumb   Total critical care time: 45 minutes  Critical care time was exclusive of separately billable procedures and treating other patients.  Critical care was necessary to treat or prevent imminent or life-threatening deterioration.  Critical care was time spent personally by me on the following activities: development of treatment plan with patient and/or surrogate as well as nursing, discussions with consultants, evaluation of patient's response to treatment, examination of patient, obtaining history from patient or surrogate, ordering and performing treatments and interventions, ordering and review of laboratory studies, ordering and review of radiographic studies, pulse oximetry and re-evaluation of patient's condition.   Medications Ordered in ED Medications  cefTRIAXone  (ROCEPHIN ) 1 g in sodium chloride  0.9 % 100 mL IVPB (has no administration in time range)  azithromycin  (ZITHROMAX ) 500 mg in sodium chloride  0.9 % 250 mL IVPB (has no administration in time range)  acetaminophen  (TYLENOL ) tablet 1,000 mg (1,000 mg Oral Given 09/22/23 0200)  oxyCODONE  (Oxy IR/ROXICODONE ) immediate release tablet 5 mg (5 mg Oral Given 09/22/23 0254)    ED Course/ Medical Decision Making/ A&P                                 Medical Decision Making Amount and/or Complexity of Data Reviewed Labs: ordered. Radiology: ordered and independent interpretation performed. ECG/medicine tests: ordered and independent interpretation performed.  Risk OTC drugs. Prescription drug management. Decision regarding hospitalization.   68 year old male presenting to the ED with altered mental  status.  Was normal prior to his dialysis session, seemed lethargic and altered upon returning to facility.  Febrile to 102F on arrival.  He does  have a wet cough.  He denies any COVID or flu exposures.  Septic workup pursued including labs, RVP, chest x-ray.     Lactate is normal at 1.0.  No leukocytosis.  Chemistry is consistent with known HD status.  Potassium is normal.  Chest x-ray without acute findings.  RVP is negative.  Patient did have some hypoxia here into the upper 80s, he was placed on 2 L and is saturating in the mid to upper 90s now.  Question if he has a pneumonia not yet present on x-ray as he does have wet cough.  Started on broad-spectrum antibiotics, blood cultures are pending.  He will require admission.  Discussed with hospitalist, Dr. Charlton-- will admit for ongoing care.  Final Clinical Impression(s) / ED Diagnoses Final diagnoses:  Fever, unspecified fever cause  Acute cough    Rx / DC Orders ED Discharge Orders     None         Jarold Olam HERO, PA-C 09/22/23 0408    Jarold Olam HERO, PA-C 09/22/23 0408    Bari Charmaine FALCON, MD 09/22/23 [phone]

## 2023-09-22 NOTE — Progress Notes (Addendum)
 Brief same day note:  Patient is a 68 year old male with history of ESRD on dialysis, hypertension, insulin -dependent diabetes type 2, COPD, chronic pain syndrome, peripheral artery disease status post bilateral BKA, coronary artery disease, paroxysmal A-fib on Eliquis  who presented with lethargy from skilled nursing facility.  No shortness of breath, cough, abdominal pain.  On presentation, he was mildly febrile with a stable heart rate and blood pressure.  Lactic acid level was 1.  No acute findings were found on chest x-ray.  Blood cultures obtained.  Started on broad spectrum antibiotics.  Patient seen and examined at bedside today in the emergency department.  Given evaluation hemodynamically stable, sleeping on the bed.  Drowsy but alert and oriented.  Denies any chest pain or shortness of breath.  No abdomen pain.  Had 3 episodes of diarrhea which is new problem,started this morning. Lungs are clear on auscultation, abdomen was benign on examination.  Assessment and plan  SIRS: Presented with lethargy, low grade fever.  No leukocytosis.  Culture sent.  Continue broad-spectrum antibiotics. Report of UTIs in the past.  Will get UA.  Wife also concerned about possible infection of the sacral wound.  No report of drainage or foul smell.  Will consult wound care   ESRD:  Last dialysis on 1/11.  On TTS schedule.  Has AV fistula on the left upper extremity.  Will consult nephrology if he is still here on Tuesday  Coronary artery disease: No anginal symptoms.  On Eliquis , Lipitor , Imdur  at home.  Denies chest pain  Paroxysmal A-fib: Currently on normal sinus rhythm.  On Eliquis , amiodarone   Hypertension: Blood pressure stable.  On amlodipine  hydralazine , labetalol , Imdur .  Currently being continued  Insulin -dependent diabetes type 2: Last A1c 5.7.  Currently on sliding scale  Chronic pain syndrome: Takes fentanyl  patch, gabapentin  and as needed Percocet .  Has bilateral BKA.

## 2023-09-22 NOTE — H&P (Signed)
 History and Physical    [name] FMW:[id number] DOB: 08/06/1956 DOA: 09/22/2023  PCP: Montey Lot, PA-C   Patient coming from: SNF  Chief Complaint: Lethargic   HPI: [name] is a 68 y.o. male with medical history significant for hypertension, insulin -dependent diabetes mellitus, COPD, chronic pain, PAD status post bilateral BKA, CAD, PAF on Eliquis , and ESRD on hemodialysis who presents with lethargy.  Patient was reportedly in his usual state prior to his dialysis appointment yesterday but was noted to be lethargic when he moved returned to his SNF after dialysis.  EMS was called for transport to the hospital.  Patient denies shortness of breath, change in his chronic cough, abdominal pain, vomiting, diarrhea, headache, or neck stiffness.  He reports chronic dysuria that is unchanged.  He denies flank pain.  ED Course: Upon arrival to the ED, patient is found to be febrile 39 C with normal heart rate and stable blood pressure.  Labs are most notable for normal potassium, normal bicarbonate, BUN 28, albumin  2.9, normal WBC, lactic acid 1.0, and negative respiratory virus panel.  No acute findings are noted on chest x-ray.  Blood cultures were collected in the ED, antibiotics were ordered, and the patient was treated with acetaminophen  and oxycodone .  Review of Systems:  All other systems reviewed and apart from HPI, are negative.  Past Medical History:  Diagnosis Date   Carotid artery occlusion 11/10/2010   LEFT CAROTID ENDARTERECTOMY   Complication of anesthesia    BP WENT UP AT [facility]    COPD (chronic obstructive pulmonary disease) (HCC)    pt denies this dx as of 06/01/20 - no inhaler    Diabetes mellitus without complication (HCC)    Diverticulitis    Diverticulosis of colon (without mention of hemorrhage)    DJD (degenerative joint disease)    knees/hands/feet/back/neck   ESRD (end stage renal disease) on dialysis (HCC)    Fatty liver    Full dentures    GERD  (gastroesophageal reflux disease)    H/O hiatal hernia    History of blood transfusion    with a past surical procedure per patient 06/01/20   Hyperlipidemia    Hypertension    Neuromuscular disorder (HCC)    peripheral neuropathy   Non-pressure chronic ulcer of other part of left foot limited to breakdown of skin (HCC) 11/12/2016   Osteomyelitis (HCC)    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 Atherectomy left popliteal artery July 2012.    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   Unstable angina (HCC) 09/16/2018   Wears glasses     Past Surgical History:  Procedure Laterality Date   ABDOMINAL AORTOGRAM W/LOWER EXTREMITY N/A 06/23/2021   Procedure: ABDOMINAL AORTOGRAM W/LOWER EXTREMITY;  Surgeon: Elmira Newman PARAS, MD;  [facility]: [facility];  Service: Cardiovascular;  Laterality: N/A;   AMPUTATION  11/05/2011   Procedure: AMPUTATION RAY;  Surgeon: Norleen Armor, MD;  Location: [facility];  Service: Orthopedics;  Laterality: Right;  Amputation of Right 4&5th Toes   AMPUTATION Left 11/26/2012   Procedure: AMPUTATION RAY;  Surgeon: Norleen Armor, MD;  Location: [facility];  Service: Orthopedics;  Laterality: Left;  fourth ray amputation   AMPUTATION Right 08/27/2014   Procedure: Transmetatarsal Amputation;  Surgeon: Jerona  Harden GAILS, MD;  Location: [facility];  Service: Orthopedics;  Laterality: Right;   AMPUTATION Right 01/14/2015   Procedure: AMPUTATION BELOW KNEE;  Surgeon: Jerona Harden GAILS, MD;  Location: [facility];  Service: Orthopedics;  Laterality: Right;   AMPUTATION Left 10/21/2015   Procedure: Left Foot 5th Ray Amputation;  Surgeon: Jerona Harden GAILS, MD;  Location: [facility] OR;  Service: Orthopedics;  Laterality: Left;   AMPUTATION Left 07/04/2022   Procedure: LEFT BELOW KNEE AMPUTATION;  Surgeon: Harden Jerona GAILS, MD;  Location: [facility]  OR;  Service: Orthopedics;  Laterality: Left;   ANTERIOR FUSION CERVICAL SPINE  02/06/06   C4-5, C5-6, C6-7; SURGEON DR. MAX COHEN   AV FISTULA PLACEMENT Left 06/02/2020   Procedure: ARTERIOVENOUS (AV) FISTULA CREATION LEFT;  Surgeon: Sheree Penne Bruckner, MD;  Location: [facility] OR;  Service: Vascular;  Laterality: Left;   BACK SURGERY     x 3   BASCILIC VEIN TRANSPOSITION Left 07/21/2020   Procedure: LEFT UPPER ARM ATERIOVENOUS SUPERFISTULALIZATION;  Surgeon: Sheree Penne Bruckner, MD;  Location: [facility] OR;  Service: Vascular;  Laterality: Left;   BELOW KNEE LEG AMPUTATION Right    CARDIAC CATHETERIZATION  10/31/04   2009   CAROTID ENDARTERECTOMY  11/10/10   CAROTID ENDARTERECTOMY Left 11/10/2010   Subtotal occlusion of left internal carotid artery with left hemispheric transient ischemic attacks.   CAROTID STENT     CARPAL TUNNEL RELEASE Right 10/21/2013   Procedure: RIGHT CARPAL TUNNEL RELEASE;  Surgeon: Arley JONELLE Curia, MD;  Location: [facility];  Service: Orthopedics;  Laterality: Right;   CHOLECYSTECTOMY     COLON SURGERY     COLONOSCOPY     COLOSTOMY REVERSAL  05/21/2018   ileostomy reversal   CYSTOSCOPY WITH STENT PLACEMENT Bilateral 01/13/2018   Procedure: CYSTOSCOPY WITH BILATERAL URETERAL CATHETER PLACEMENT;  Surgeon: Cam Morene ORN, MD;  Location: [facility];  Service: Urology;  Laterality: Bilateral;   ESOPHAGEAL MANOMETRY Bilateral 07/19/2014   Procedure: ESOPHAGEAL MANOMETRY (EM);  Surgeon: Gordy CHRISTELLA Starch, MD;  Location: [facility];  Service: Gastroenterology;  Laterality: Bilateral;   EYE SURGERY Bilateral 2020   cataract   FEMORAL ARTERY STENT     x6   FINGER SURGERY     FOOT SURGERY  04/25/2016    EXCISION BASE 5TH METATARSAL AND PARTIAL CUBOID LEFT FOOT   HERNIA REPAIR     LEFT INGUINAL AND UMBILICAL REPAIRS   HERNIA REPAIR     I & D EXTREMITY Left 04/25/2016   Procedure: EXCISION BASE 5TH METATARSAL AND PARTIAL CUBOID LEFT FOOT;  Surgeon: Jerona GAILS Harden, MD;   [facility];  Service: Orthopedics;  Laterality: Left;   ILEOSTOMY  01/13/2018   Procedure: ILEOSTOMY;  Surgeon: Signe Mitzie LABOR, MD;  Location: [facility];  Service: General;;   ILEOSTOMY CLOSURE N/A 05/21/2018   Procedure: ILEOSTOMY REVERSAL ERAS PATHWAY;  Surgeon: Signe Mitzie LABOR, MD;  [facility];  Service: General;  Laterality: N/A;   IR RADIOLOGIST EVAL & MGMT  11/19/2017   IR RADIOLOGIST EVAL & MGMT  12/03/2017   IR RADIOLOGIST EVAL & MGMT  12/18/2017   JOINT REPLACEMENT Right 2001   Total knee   LAMINECTOMY     X 3 LUMBAR AND X 2 CERVICAL SPINE OPERATIONS   LAPAROSCOPIC CHOLECYSTECTOMY W/ CHOLANGIOGRAPHY  11/09/04   SURGEON DR. DEWARD CANDIE NULL   LEFT HEART CATH AND CORONARY ANGIOGRAPHY N/A 09/16/2018   Procedure: LEFT HEART CATH AND CORONARY ANGIOGRAPHY;  Surgeon: Elmira Newman PARAS, MD;  [facility];  Service: Cardiovascular;  Laterality: N/A;   LEFT HEART CATHETERIZATION WITH CORONARY ANGIOGRAM N/A 10/29/2014   Procedure: LEFT HEART CATHETERIZATION WITH CORONARY ANGIOGRAM;  Surgeon: Erick JONELLE Bergamo, MD;  Location: [facility] CATH LAB;  Service: Cardiovascular;  Laterality: N/A;   LIGATION OF COMPETING BRANCHES OF ARTERIOVENOUS FISTULA Left 07/21/2020   Procedure: LIGATION OF COMPETING BRANCHES OF LEFT UPPER ARM ARTERIOVENOUS FISTULA;  Surgeon: Sheree Penne Bruckner, MD;  Location: [facility] OR;  Service: Vascular;  Laterality: Left;   LOWER EXTREMITY ANGIOGRAM N/A 03/19/2012   Procedure: LOWER EXTREMITY ANGIOGRAM;  Surgeon: Bruckner JONETTA Cash, MD;  Location: [facility] CATH LAB;  Service: Cardiovascular;  Laterality: N/A;   LOWER EXTREMITY ANGIOGRAPHY N/A 06/20/2021   Procedure: LOWER EXTREMITY ANGIOGRAPHY;  Surgeon: Elmira Newman PARAS, MD;  [facility];  Service: Cardiovascular;  Laterality: N/A;   NECK SURGERY     PARTIAL COLECTOMY N/A 01/13/2018   Procedure: LAPAROSCOPIC ASSISTED   SIGMOID COLECTOMY ILEOSTOMY;  Surgeon: Signe Mitzie LABOR, MD;  Location: [facility];   Service: General;  Laterality: N/A;   PENILE PROSTHESIS IMPLANT  08/14/05   INFRAPUBIC INSERTION OF INFLATABLE PENILE PROSTHESIS; SURGEON DR. JANIT   PENILE PROSTHESIS IMPLANT     PERCUTANEOUS CORONARY STENT INTERVENTION (PCI-S) Right 10/29/2014   Procedure: PERCUTANEOUS CORONARY STENT INTERVENTION (PCI-S);  Surgeon: Erick JONELLE Bergamo, MD;  Location: [facility] CATH LAB;  Service: Cardiovascular;  Laterality: Right;   PERIPHERAL VASCULAR INTERVENTION Left 06/23/2021   Procedure: PERIPHERAL VASCULAR INTERVENTION;  Surgeon: Elmira Newman PARAS, MD;  [facility];  Service: Cardiovascular;  Laterality: Left;   REMOVAL OF PENILE PROSTHESIS N/A 06/14/2021   Procedure: Removal of THREE piece inflatable penile prosthesis;  Surgeon: Carolee Sherwood JONETTA DOUGLAS, MD;  Location: [facility] OR;  Service: Urology;  Laterality: N/A;   SHOULDER ARTHROSCOPY     SPINE SURGERY     TOE AMPUTATION Left    TONSILLECTOMY     TOTAL KNEE ARTHROPLASTY  07/2002   RIGHT KNEE ; SURGEON  DR. [name] ALSO HAD ARTHROSCOPIC RIGHT KNEE IN  10/2001   TOTAL KNEE ARTHROPLASTY     ULNAR NERVE TRANSPOSITION Right 10/21/2013   Procedure: RIGHT ELBOW  ULNAR NERVE DECOMPRESSION;  Surgeon: Arley JONELLE Curia, MD;  Location: [facility];  Service: Orthopedics;  Laterality: Right;    Social History:   reports that he has been smoking cigarettes. He has a 17.5 pack-year smoking history. He has never used smokeless tobacco. He reports that he does not currently use alcohol . He reports that he does not use drugs.  Allergies  Allergen Reactions   Contrast Media [Iodinated Contrast Media] Shortness Of Breath and Other (See Comments)    Difficulty breathing and altered mental status     Ivp Dye [Iodinated Contrast Media] Anaphylaxis, Shortness Of Breath and Other (See Comments)    Breathing problems, altered mental state    Latex Rash and Other (See Comments)    A severe rash appears after the first 24 hours of being placed   Tape  Other (See Comments) and Rash    Rash after 1 day of use  Do not leave on longer then 3 days with out be changed    Family History  Problem Relation Age of Onset   Heart disease Father        Before age 64-  CAD, BPG   Diabetes Father        Amputation   Cancer Father  PROSTATE   Hyperlipidemia Father    Hypertension Father    Heart attack Father        Triple BPG   Varicose Veins Father    Cancer Sister        Breast   Hyperlipidemia Sister    Hypertension Sister    Heart attack Brother    Colon cancer Brother    Diabetes Brother    Heart disease Brother 51       A-Fib. Before age 40   Hyperlipidemia Brother    Hypertension Brother    Hypertension Son    Arthritis Other        GRANDMOTHER   Hypertension Other        OTHER FAMILY MEMBERS     Prior to Admission medications   Medication Sig Start Date End Date Taking? Authorizing Provider  amiodarone  (PACERONE ) 200 MG tablet Take 1 tablet (200 mg total) by mouth daily. 10/29/22   Ladona Heinz, MD  amLODipine  (NORVASC ) 10 MG tablet Take 10 mg by mouth daily.    [provider]  apixaban  (ELIQUIS ) 5 MG TABS tablet Take 1 tablet (5 mg total) by mouth 2 (two) times daily. 01/14/22 07/18/23  Gonfa, Taye T, MD  atorvastatin  (LIPITOR ) 40 MG tablet Take 40 mg by mouth at bedtime.     [provider]  B Complex-C-Zn-Folic Acid  (DIALYVITE /ZINC ) TABS Take 1 tablet by mouth Every Tuesday,Thursday,and Saturday with dialysis. 10/04/21   [provider]  docusate sodium  (COLACE) 100 MG capsule Take 100 mg by mouth 2 (two) times daily.    [provider]  famotidine  (PEPCID ) 20 MG tablet Take 20 mg by mouth every other day.    [provider]  fentaNYL  (DURAGESIC ) 25 MCG/HR Place 1 patch onto the skin every 3 (three) days. 06/01/23   Ghimire, Donalda HERO, MD  folic acid  (FOLVITE ) 1 MG tablet Take 1 mg by mouth daily. 09/18/21   [provider]  gabapentin  (NEURONTIN ) 100 MG capsule Take  100 mg by mouth 3 (three) times daily.    [provider]  hydrALAZINE  (APRESOLINE ) 50 MG tablet Take 1 tablet (50 mg total) by mouth every 8 (eight) hours. 04/01/23   Briana Elgin LABOR, MD  Icosapent  Ethyl 0.5 g CAPS Take 1 capsule by mouth daily.    [provider]  insulin  aspart (NOVOLOG  FLEXPEN) 100 UNIT/ML FlexPen Inject 0-12 Units into the skin See admin instructions. Inject 4 units into the skin with meals in addition to sliding scale as needed. Inject as per sliding scale: If 0 - 69 = 0 initiate hypoglycemia orders;  70 - 150 = 0;  151 - 200 = 2;  201 - 250 = 4;  251 - 300 = 6;  301 - 350 = 8;  351 - 400 = 10;  401 - 450 = 12.  If >400, give 12 units, recheck in 1 hour. If still >400, call MD/NP for further orders. Subcutaneously with meals in addition to scheduled 4 unites with meals.    [provider]  insulin  aspart (NOVOLOG ) 100 UNIT/ML injection Inject 4 Units into the skin 3 (three) times daily with meals.    [provider]  isosorbide  mononitrate (IMDUR ) 60 MG 24 hr tablet Take 1 tablet (60 mg total) by mouth daily. 01/21/23 07/18/23  Ladona Heinz, MD  labetalol  (NORMODYNE ) 200 MG tablet Take 1 tablet (200 mg total) by mouth 2 (two) times daily. 04/01/23   Briana Elgin LABOR, MD  lidocaine  (  LIDODERM ) 5 % Place 1 patch onto the skin daily as needed. Remove & Discard patch within 12 hours or as directed by MD 07/20/23   Tobie Gaines, DO  lidocaine -prilocaine  (EMLA ) cream Apply 1 application topically See admin instructions. Prior to Dialysis days Tuesday,Thursday and saturday 06/06/21   [provider]  loratadine  (CLARITIN ) 10 MG tablet Take 10 mg by mouth daily.    [provider]  midodrine  (PROAMATINE ) 10 MG tablet Take 10 mg by mouth daily as needed (SBP less than 100.).    [provider]  NEEDLE, REUSABLE, 22 G 22G X 1-1/2 MISC 1 Units by Does not apply route as directed. For B12 IM inj 02/15/18   Elgergawy, Brayton RAMAN, MD   nitroGLYCERIN  (NITROSTAT ) 0.4 MG SL tablet Place 0.4 mg under the tongue every 5 (five) minutes as needed for chest pain. 01/06/22   [provider]  Nystatin ([name]'S BUTT CREAM) CREA Apply 1 Application topically 3 (three) times daily as needed for irritation. 07/20/23   Tobie Gaines, DO  ondansetron  (ZOFRAN ) 4 MG tablet Take 1 tablet (4 mg total) by mouth every 8 (eight) hours as needed for nausea or vomiting. 04/24/23   Ladona Heinz, MD  oxyCODONE -acetaminophen  (PERCOCET /ROXICET) 5-325 MG tablet Take 1 tablet by mouth every 8 (eight) hours as needed for severe pain. 05/31/23   Ghimire, Donalda HERO, MD  rOPINIRole  (REQUIP ) 2 MG tablet Take 2 mg by mouth at bedtime.    [provider]  senna-docusate (SENOKOT-S) 8.6-50 MG tablet Take 2 tablets by mouth at bedtime.    [provider]  Syringe/Needle, Disp, (SYRINGE 3CC/22GX1-1/2) 22G X 1-1/2 3 ML MISC 1 Syringe by Does not apply route as directed. For b12 IM inj 02/15/18   Elgergawy, Brayton RAMAN, MD  tamsulosin  (FLOMAX ) 0.4 MG CAPS capsule Take 0.4 mg by mouth daily.    [provider]    Physical Exam: Vitals:   09/22/23 0128 09/22/23 0239 09/22/23 0345 09/22/23 0401  BP: (!) 147/76 132/60 (!) 109/53   Pulse: 94 88 77   Resp: (!) 25 19 17    Temp: (!) 102.2 F (39 C)   (!) 100.8 F (38.2 C)  TempSrc: Rectal   Rectal  SpO2: 90% 96% 98%   Weight:      Height:         Constitutional: NAD, no pallor or diaphoresis   Eyes: PERTLA, lids and conjunctivae normal ENMT: Mucous membranes are moist. Posterior pharynx clear of any exudate or lesions.   Neck: supple, no masses  Respiratory: no wheezing, no crackles. No accessory muscle use.  Cardiovascular: S1 & S2 heard, regular rate and rhythm. No extremity edema.  Abdomen: No distension, no tenderness, soft. Bowel sounds active.  Musculoskeletal: no clubbing / cyanosis. No joint deformity upper and lower extremities. No meningismus.   Skin: no significant rashes,  lesions, ulcers. Warm, dry, well-perfused. Neurologic: CN 2-12 grossly intact. Moving all extremities. Sleeping, wakes to voice and is oriented to person, place, and situation.  Psychiatric: Calm. Cooperative.    Labs and Imaging on Admission: I have personally reviewed following labs and imaging studies  CBC: Recent Labs  Lab 09/22/23 0123  WBC 9.3  NEUTROABS 8.3*  HGB 11.7*  HCT 35.4*  MCV 93.9  PLT 155   Basic Metabolic Panel: Recent Labs  Lab 09/22/23 0123  NA 139  K 4.2  CL 97*  CO2 29  GLUCOSE 172*  BUN 28*  CREATININE 4.27*  CALCIUM  8.1*   GFR:  Estimated Creatinine Clearance: 22.7 mL/min (A) (by C-G formula based on SCr of 4.27 mg/dL (H)). Liver Function Tests: Recent Labs  Lab 09/22/23 0123  AST 19  ALT 17  ALKPHOS 96  BILITOT 0.5  PROT 6.1*  ALBUMIN  2.9*   No results for input(s): LIPASE, AMYLASE in the last 168 hours. No results for input(s): AMMONIA in the last 168 hours. Coagulation Profile: No results for input(s): INR, PROTIME in the last 168 hours. Cardiac Enzymes: No results for input(s): CKTOTAL, CKMB, CKMBINDEX, TROPONINI in the last 168 hours. BNP (last 3 results) No results for input(s): PROBNP in the last 8760 hours. HbA1C: No results for input(s): HGBA1C in the last 72 hours. CBG: No results for input(s): GLUCAP in the last 168 hours. Lipid Profile: No results for input(s): CHOL, HDL, LDLCALC, TRIG, CHOLHDL, LDLDIRECT in the last 72 hours. Thyroid  Function Tests: No results for input(s): TSH, T4TOTAL, FREET4, T3FREE, THYROIDAB in the last 72 hours. Anemia Panel: No results for input(s): VITAMINB12, FOLATE, FERRITIN, TIBC, IRON, RETICCTPCT in the last 72 hours. Urine analysis:    Component Value Date/Time   COLORURINE YELLOW 05/28/2023 1427   APPEARANCEUR CLEAR 05/28/2023 1427   LABSPEC 1.014 05/28/2023 1427   PHURINE 8.0 05/28/2023 1427   GLUCOSEU 150 (A) 05/28/2023  1427   HGBUR NEGATIVE 05/28/2023 1427   BILIRUBINUR NEGATIVE 05/28/2023 1427   KETONESUR NEGATIVE 05/28/2023 1427   PROTEINUR >=300 (A) 05/28/2023 1427   UROBILINOGEN 0.2 01/14/2015 0346   NITRITE NEGATIVE 05/28/2023 1427   LEUKOCYTESUR TRACE (A) 05/28/2023 1427   Sepsis Labs: @LABRCNTIP (procalcitonin:4,lacticidven:4) ) Recent Results (from the past 240 hours)  Resp panel by RT-PCR (RSV, Flu A&B, Covid) Anterior Nasal Swab     Status: None   Collection Time: 09/22/23  1:23 AM   Specimen: Anterior Nasal Swab  Result Value Ref Range Status   SARS Coronavirus 2 by RT PCR NEGATIVE NEGATIVE Final   Influenza A by PCR NEGATIVE NEGATIVE Final   Influenza B by PCR NEGATIVE NEGATIVE Final    Comment: (NOTE) The Xpert Xpress SARS-CoV-2/FLU/RSV plus assay is intended as an aid in the diagnosis of influenza from Nasopharyngeal swab specimens and should not be used as a sole basis for treatment. Nasal washings and aspirates are unacceptable for Xpert Xpress SARS-CoV-2/FLU/RSV testing.  Fact Sheet for Patients: bloggercourse.com  Fact Sheet for Healthcare Providers: seriousbroker.it  This test is not yet approved or cleared by the [place]  FDA and has been authorized for detection and/or diagnosis of SARS-CoV-2 by FDA under an Emergency Use Authorization (EUA). This EUA will remain in effect (meaning this test can be used) for the duration of the COVID-19 declaration under Section 564(b)(1) of the Act, 21 U.S.C. section 360bbb-3(b)(1), unless the authorization is terminated or revoked.     Resp Syncytial Virus by PCR NEGATIVE NEGATIVE Final    Comment: (NOTE) Fact Sheet for Patients: bloggercourse.com  Fact Sheet for Healthcare Providers: seriousbroker.it  This test is not yet approved or cleared by the [place]  FDA and has been authorized for detection and/or diagnosis of  SARS-CoV-2 by FDA under an Emergency Use Authorization (EUA). This EUA will remain in effect (meaning this test can be used) for the duration of the COVID-19 declaration under Section 564(b)(1) of the Act, 21 U.S.C. section 360bbb-3(b)(1), unless the authorization is terminated or revoked.  Performed at [facility], [address], [place], [place] [address]      Radiological Exams on Admission: DG Chest [facility] 1 View Result  Date: 09/22/2023 CLINICAL DATA:  Sepsis EXAM: PORTABLE CHEST 1 VIEW COMPARISON:  CT chest dated 07/18/2023 FINDINGS: Mild bibasilar atelectasis/scarring. No pleural effusion or pneumothorax. The heart is normal in size.  Thoracic aortic calcifications. IMPRESSION: No acute cardiopulmonary disease. Mild bibasilar atelectasis/scarring. Electronically Signed   By: Pinkie Pebbles M.D.   On: 09/22/2023 02:37    EKG: Independently reviewed. Sinus rhythm, RBBB.   Assessment/Plan   1. SIRS  - Febrile and mildly tachypneic on arrival; no infectious source has been identified - Treat with broad-spectrum antibiotics for now while following cultures and clinical course    2. ESRD  - Completed HD 09/21/23 per pt report  - Restrict fluids, renally-dose medications    3. CAD  - No anginal symptoms  - Continue Eliquis , Lipitor , Imdur     4. PAF  - Eliquis , amiodarone    5. HTN  - Hydralazine , labetalol , amlodipine    6. Insulin -dependent DM  - A1c was 5.7% in November 2024  - Check CBGs, use low-intensity SSI   7. Chronic pain  - Prescription database reviewed, plan to continue fentanyl  patch, gabapentin , and as-needed Percocet     DVT prophylaxis: Eliquis   Code Status: Full  Level of Care: Level of care: Telemetry Medical Family Communication: None present  Disposition Plan:  Patient is from: SNF  Anticipated d/c is to: SNF  Anticipated d/c date is: Possibly as early as 09/23/23  Patient currently: Pending clinical improvement, cultures  Consults  called: None  Admission status: Observation     Evalene GORMAN Sprinkles, MD [facility]  09/22/2023, 4:31 AM

## 2023-09-22 NOTE — TOC Initial Note (Signed)
 Transition of Care [facility]) - Initial/Assessment Note    Patient Details  Name: [name] MRN: [id number] Date of Birth: Oct 31, 1955  [facility] CM/SW Contact:    Gwenn Julien [place], [place] Phone Number: 09/22/2023, 5:26 PM  Clinical Narrative: Spoke to pt's wife Katheryn who confirmed pt has been a resident a [facility] for 8 months and they plan is for return at dc. Per wife, pt has been receiving PT/OT intermittently during SNF stay contingent on [employer] approval. Left message with Dena in admissions to confirm pt's status. Will provide updates as available.   Julien Gwenn, MSW, LCSW [phone] (coverage)              Expected Discharge Plan: Skilled Nursing Facility Barriers to Discharge: Continued Medical Work up   Patient Goals and CMS Choice            Expected Discharge Plan and Services     Post Acute Care Choice: Skilled Nursing Facility Living arrangements for the past 2 months: Skilled Nursing Facility                                      Prior Living Arrangements/Services Living arrangements for the past 2 months: Skilled Nursing Facility   Patient language and need for interpreter reviewed:: Yes Do you feel safe going back to the place where you live?: Yes      Need for Family Participation in Patient Care: Yes (Comment) Care giver support system in place?: Yes (comment)   Criminal Activity/Legal Involvement Pertinent to Current Situation/Hospitalization: No - Comment as needed  Activities of Daily Living   ADL Screening (condition at time of admission) Independently performs ADLs?: No Does the patient have a NEW difficulty with bathing/dressing/toileting/self-feeding that is expected to last >3 days?: No Does the patient have a NEW difficulty with getting in/out of bed, walking, or climbing stairs that is expected to last >3 days?: No Does the patient have a NEW difficulty with communication that is expected to last >3  days?: No Is the patient deaf or have difficulty hearing?: No Does the patient have difficulty seeing, even when wearing glasses/contacts?: No Does the patient have difficulty concentrating, remembering, or making decisions?: No  Permission Sought/Granted Permission sought to share information with : Facility Industrial/product Designer granted to share information with : Yes, Verbal Permission Granted              Emotional Assessment       Orientation: : Oriented to Self, Fluctuating Orientation (Suspected and/or reported Sundowners), Oriented to Place Alcohol  / Substance Use: Not Applicable Psych Involvement: No (comment)  Admission diagnosis:  SIRS (systemic inflammatory response syndrome) (HCC) [R65.10] Fever, unspecified fever cause [R50.9] Acute cough [R05.1] Patient Active Problem List   Diagnosis Date Noted   SIRS (systemic inflammatory response syndrome) (HCC) 09/22/2023   Hyperkalemia 07/19/2023   ESRD needing dialysis (HCC) 07/19/2023   Severe sepsis (HCC) 05/28/2023   Acute encephalopathy 05/28/2023   Acute respiratory failure with hypoxia (HCC) 05/28/2023   Benign hypertension 04/01/2023   Precordial pain 03/31/2023   History of right coronary artery stent placement 03/31/2023   Current use of long term anticoagulation 03/31/2023   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) 03/31/2023   Paroxysmal atrial fibrillation (HCC) 03/31/2023   Hypertensive crisis 03/31/2023   Unstable angina (HCC) 03/30/2023   Tobacco abuse 03/30/2023   Prolonged  QT interval 03/30/2023   Elevated troponin 03/30/2023   Cellulitis 11/20/2022   Chest wall pain 07/04/2022   S/P BKA (below knee amputation) unilateral, left (HCC) 07/04/2022   Subacute osteomyelitis, left ankle and foot (HCC) 07/03/2022   Physical deconditioning 01/13/2022   Anemia of renal disease 01/13/2022   Obesity (BMI 30-39.9) 01/13/2022   Decubitus ulcer of buttock 01/13/2022    Intractable nausea and vomiting with epigastric pain 01/11/2022   Chronic osteomyelitis (HCC) 01/11/2022   Proliferative diabetic retinopathy of left eye with macular edema associated with type 2 diabetes mellitus (HCC) 11/27/2021   Vitreous hemorrhage of right eye (HCC) 11/27/2021   Sepsis (HCC) 09/02/2021   Severe sepsis with lactic acidosis (HCC) 06/14/2021   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) 06/14/2021   ESRD on hemodialysis with hyperkalemia 06/14/2021   Paroxysmal atrial fibrillation with RVR (HCC) 06/14/2021   Carotid stenosis 06/15/2020   Dyslipidemia 06/15/2020   Diabetic macular edema of right eye with proliferative retinopathy associated with type 2 diabetes mellitus (HCC) 01/18/2020   Severe nonproliferative diabetic retinopathy of left eye, with macular edema, associated with type 2 diabetes mellitus (HCC) 01/04/2020   Retinal hemorrhage of right eye 01/04/2020   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   Pseudoclaudication 11/15/2018   S/P BKA (below knee amputation), right (HCC) 09/18/2018   Amputation of toe of left foot (HCC) 09/18/2018   Peripheral artery disease (HCC) 09/18/2018   S/P carotid endarterectomy 09/18/2018   OSA not tolerating CPAP 09/18/2018   Chronic back pain 09/18/2018   Status post reversal of ileostomy 05/21/2018   Normocytic anemia 02/14/2018   Intra-abdominal abscess (HCC) 11/04/2017   Chronic venous hypertension (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 (HCC) 12/11/2016   PAD (peripheral artery disease) (HCC) 11/12/2016   Unilateral primary osteoarthritis, left knee 10/11/2016   History of right below knee amputation (HCC) 07/12/2016   Infected diabetic left foot ulcer with chronic osteomyelitis    Diabetic foot infection (HCC) 05/04/2016   Insulin  dependent type 2 diabetes mellitus (HCC) 12/16/2015   Amputated toe (HCC) 10/21/2015   Cellulitis of left lower extremity 09/03/2015   Leg edema, left  09/03/2015   CAD (dz of distal, mid and proximal RCA with implantation of 3 overlapping drug-eluting stent, with elevated troponin 09/03/2015   Chronic diastolic heart failure, NYHA class 2 (HCC) 09/03/2015   Hyperlipidemia 08/25/2014   Limb pain 03/20/2013   DJD (degenerative joint disease) 09/25/2012   Migraine 09/25/2012   Neuropathy 09/25/2012   Restless legs syndrome (RLS) 09/25/2012   Chronic obstructive pulmonary disease, unspecified (HCC) 04/25/2012   Unknown cause of morbidity or mortality 04/25/2012   Chronic total occlusion of artery of the extremities (HCC) 04/08/2012   Onychomycosis 02/01/2012   Occlusion and stenosis of carotid artery without mention of cerebral infarction 06/12/2011   Barrett's esophagus without dysplasia 05/08/2011   Former tobacco use 02/01/2011   PCP:  Montey Lot, PA-C Pharmacy:   [id number] [place], [place] - [address] AT [address] [place] [address] Phone: [phone] Fax: [phone]  [id number] [place], [place] - [address] [place] [address] Phone: [phone] Fax: [phone]  [facility] - [place], [place] - [address] [place] [address] Phone: [phone] Fax: [phone]     Social Drivers of Health (SDOH) Social History: SDOH Screenings   Food Insecurity: No Food Insecurity (09/22/2023)  Housing: Low Risk  (09/22/2023)  Transportation  Needs: No Transportation Needs (09/22/2023)  Utilities: Not At Risk (09/22/2023)  Social Connections: Moderately Isolated (09/22/2023)  Tobacco Use: High Risk (09/22/2023)   SDOH Interventions:     Readmission Risk Interventions    05/29/2023    4:41 PM 06/30/2021   11:53 AM  Readmission Risk Prevention Plan  Transportation Screening Complete Complete  Medication Review Oceanographer) Complete Complete  PCP or Specialist appointment within 3-5 days of  discharge Complete   HRI or Home Care Consult Complete Complete  SW Recovery Care/Counseling Consult Complete Complete  Palliative Care Screening Not Applicable Not Applicable  Skilled Nursing Facility Complete Not Applicable

## 2023-09-22 NOTE — Consult Note (Signed)
 WOC Nurse Consult Note: Reason for Consult: sacral wound Patient from SNF, admitted for weakness Wound type:Stage 2 Pressure Injury Pressure Injury POA: Yes Measurement: see nursing flow sheets Wound bed: clean and pink Drainage (amount, consistency, odor) none documented  Periwound: intact  Dressing procedure/placement/frequency: Discussed with bedside nursing, continue nursing skin care order set, manage with foam dressing. Change every 3 days and PRN soilage.  Turn and reposition per hospital policy    Re consult if needed, will not follow at this time. Thanks  Cecely Rengel [employer], RN,CWOCN, CNS, CWON-AP [phone])

## 2023-09-22 NOTE — Progress Notes (Addendum)
 Patient has arrived to the unit via stretcher from ED. A/O x 4. VSS. Placed on tele NSR. Stage 2 pressure ulcer noted to patient sacrum.Partial thickness, clean, pink, not necrotic. New foam dressing applied.  Oriented patient to the room and staff. Education provided regarding safety precaution and patient verbalize understating. Call bell within reach.

## 2023-09-23 DIAGNOSIS — J449 Chronic obstructive pulmonary disease, unspecified: Secondary | ICD-10-CM | POA: Diagnosis not present

## 2023-09-23 DIAGNOSIS — F17210 Nicotine dependence, cigarettes, uncomplicated: Secondary | ICD-10-CM | POA: Diagnosis not present

## 2023-09-23 DIAGNOSIS — A0811 Acute gastroenteropathy due to Norwalk agent: Secondary | ICD-10-CM | POA: Diagnosis not present

## 2023-09-23 DIAGNOSIS — R197 Diarrhea, unspecified: Secondary | ICD-10-CM | POA: Diagnosis not present

## 2023-09-23 DIAGNOSIS — D631 Anemia in chronic kidney disease: Secondary | ICD-10-CM | POA: Diagnosis not present

## 2023-09-23 DIAGNOSIS — E1142 Type 2 diabetes mellitus with diabetic polyneuropathy: Secondary | ICD-10-CM | POA: Diagnosis not present

## 2023-09-23 DIAGNOSIS — I120 Hypertensive chronic kidney disease with stage 5 chronic kidney disease or end stage renal disease: Secondary | ICD-10-CM | POA: Diagnosis not present

## 2023-09-23 DIAGNOSIS — N186 End stage renal disease: Secondary | ICD-10-CM | POA: Diagnosis not present

## 2023-09-23 DIAGNOSIS — I480 Paroxysmal atrial fibrillation: Secondary | ICD-10-CM | POA: Diagnosis not present

## 2023-09-23 DIAGNOSIS — K760 Fatty (change of) liver, not elsewhere classified: Secondary | ICD-10-CM | POA: Diagnosis not present

## 2023-09-23 DIAGNOSIS — Z1152 Encounter for screening for COVID-19: Secondary | ICD-10-CM | POA: Diagnosis not present

## 2023-09-23 DIAGNOSIS — Z794 Long term (current) use of insulin: Secondary | ICD-10-CM | POA: Diagnosis not present

## 2023-09-23 DIAGNOSIS — Z992 Dependence on renal dialysis: Secondary | ICD-10-CM | POA: Diagnosis not present

## 2023-09-23 DIAGNOSIS — A419 Sepsis, unspecified organism: Secondary | ICD-10-CM | POA: Diagnosis not present

## 2023-09-23 DIAGNOSIS — Z89511 Acquired absence of right leg below knee: Secondary | ICD-10-CM | POA: Diagnosis not present

## 2023-09-23 DIAGNOSIS — Z91040 Latex allergy status: Secondary | ICD-10-CM | POA: Diagnosis not present

## 2023-09-23 DIAGNOSIS — E1122 Type 2 diabetes mellitus with diabetic chronic kidney disease: Secondary | ICD-10-CM | POA: Diagnosis not present

## 2023-09-23 DIAGNOSIS — E1151 Type 2 diabetes mellitus with diabetic peripheral angiopathy without gangrene: Secondary | ICD-10-CM | POA: Diagnosis not present

## 2023-09-23 DIAGNOSIS — E119 Type 2 diabetes mellitus without complications: Secondary | ICD-10-CM | POA: Diagnosis not present

## 2023-09-23 DIAGNOSIS — Z743 Need for continuous supervision: Secondary | ICD-10-CM | POA: Diagnosis not present

## 2023-09-23 DIAGNOSIS — N250 Renal osteodystrophy: Secondary | ICD-10-CM | POA: Diagnosis not present

## 2023-09-23 DIAGNOSIS — A0472 Enterocolitis due to Clostridium difficile, not specified as recurrent: Secondary | ICD-10-CM | POA: Diagnosis not present

## 2023-09-23 DIAGNOSIS — G894 Chronic pain syndrome: Secondary | ICD-10-CM | POA: Diagnosis not present

## 2023-09-23 DIAGNOSIS — Z79899 Other long term (current) drug therapy: Secondary | ICD-10-CM | POA: Diagnosis not present

## 2023-09-23 DIAGNOSIS — R6510 Systemic inflammatory response syndrome (SIRS) of non-infectious origin without acute organ dysfunction: Secondary | ICD-10-CM | POA: Diagnosis not present

## 2023-09-23 DIAGNOSIS — I2510 Atherosclerotic heart disease of native coronary artery without angina pectoris: Secondary | ICD-10-CM | POA: Diagnosis not present

## 2023-09-23 DIAGNOSIS — E785 Hyperlipidemia, unspecified: Secondary | ICD-10-CM | POA: Diagnosis not present

## 2023-09-23 DIAGNOSIS — M549 Dorsalgia, unspecified: Secondary | ICD-10-CM | POA: Diagnosis not present

## 2023-09-23 DIAGNOSIS — L89152 Pressure ulcer of sacral region, stage 2: Secondary | ICD-10-CM | POA: Diagnosis not present

## 2023-09-23 DIAGNOSIS — Z89512 Acquired absence of left leg below knee: Secondary | ICD-10-CM | POA: Diagnosis not present

## 2023-09-23 DIAGNOSIS — R531 Weakness: Secondary | ICD-10-CM | POA: Diagnosis not present

## 2023-09-23 DIAGNOSIS — K219 Gastro-esophageal reflux disease without esophagitis: Secondary | ICD-10-CM | POA: Diagnosis not present

## 2023-09-23 LAB — BASIC METABOLIC PANEL
Anion gap: 14 (ref 5–15)
BUN: 48 mg/dL — ABNORMAL HIGH (ref 8–23)
CO2: 24 mmol/L (ref 22–32)
Calcium: 8 mg/dL — ABNORMAL LOW (ref 8.9–10.3)
Chloride: 97 mmol/L — ABNORMAL LOW (ref 98–111)
Creatinine, Ser: 5.94 mg/dL — ABNORMAL HIGH (ref 0.61–1.24)
GFR, Estimated: 10 mL/min — ABNORMAL LOW (ref >60)
Glucose, Bld: 124 mg/dL — ABNORMAL HIGH (ref 70–99)
Potassium: 3.9 mmol/L (ref 3.5–5.1)
Sodium: 135 mmol/L (ref 135–145)

## 2023-09-23 LAB — GLUCOSE, CAPILLARY
Glucose-Capillary: 127 mg/dL — ABNORMAL HIGH (ref 70–99)
Glucose-Capillary: 151 mg/dL — ABNORMAL HIGH (ref 70–99)
Glucose-Capillary: 127 mg/dL — ABNORMAL HIGH (ref 70–99)
Glucose-Capillary: 134 mg/dL — ABNORMAL HIGH (ref 70–99)

## 2023-09-23 LAB — C DIFFICILE QUICK SCREEN W PCR REFLEX
C Diff antigen: POSITIVE — AB
C Diff interpretation: DETECTED
C Diff toxin: POSITIVE — AB

## 2023-09-23 LAB — CBC
HCT: 29.7 % — ABNORMAL LOW (ref 39.0–52.0)
Hemoglobin: 9.8 g/dL — ABNORMAL LOW (ref 13.0–17.0)
MCH: 31.1 pg (ref 26.0–34.0)
MCHC: 33 g/dL (ref 30.0–36.0)
MCV: 94.3 fL (ref 80.0–100.0)
Platelets: 134 10*3/uL — ABNORMAL LOW (ref 150–400)
RBC: 3.15 MIL/uL — ABNORMAL LOW (ref 4.22–5.81)
RDW: 13.9 % (ref 11.5–15.5)
WBC: 6.4 10*3/uL (ref 4.0–10.5)
nRBC: 0 % (ref 0.0–0.2)

## 2023-09-23 MED ORDER — VANCOMYCIN 50 MG/ML ORAL SOLUTION
125.0000 mg | Freq: Four times a day (QID) | ORAL | Status: DC
  Administered 2023-09-23 – 2023-09-25 (×8): 125 mg via ORAL
  Filled 2023-09-23 (×9): qty 2.5

## 2023-09-23 NOTE — Progress Notes (Signed)
 PROGRESS NOTE  [name]  FMW:[id number] DOB: Nov 16, 1955 DOA: 09/22/2023 PCP: Montey Lot, PA-C   Brief Narrative: Patient is a 68 year old male with history of ESRD on dialysis, hypertension, insulin -dependent diabetes type 2, COPD, chronic pain syndrome, peripheral artery disease status post bilateral BKA, coronary artery disease, paroxysmal A-fib on Eliquis  who presented with lethargy from skilled nursing facility.  No shortness of breath, cough, abdominal pain.  On presentation, he was mildly febrile with a stable heart rate and blood pressure.  Lactic acid level was 1.  No acute findings were found on chest x-ray.  Blood cultures obtained.  Started on broad spectrum antibiotics.  Hospital course was remarkable for diarrhea.  Assessment & Plan:  Principal Problem:   SIRS (systemic inflammatory response syndrome) (HCC) Active Problems:   Decubitus ulcer of buttock   CAD (dz of distal, mid and proximal RCA with implantation of 3 overlapping drug-eluting stent, with elevated troponin   Insulin  dependent type 2 diabetes mellitus (HCC)   Paroxysmal atrial fibrillation (HCC)   Benign hypertension   Peripheral artery disease (HCC)   Chronic back pain   Chronic obstructive pulmonary disease, unspecified (HCC)   ESRD needing dialysis (HCC)   SIRS: Presented with lethargy, low grade fever.  No leukocytosis.  Culture sent.  Continue broad-spectrum antibiotics for today. Report of UTIs in the past.  Has a stage II sacral wound.  UA not suspicious for UTI.  No report of drainage or foul smell from sacral wound.  Sepsis physiology has resolved.  Cultures have not shown any growth.  Respiratory viral panel negative.  Low threshold to discontinue antibiotics  Diarrhea: New problem that started after he presented to the hospital.  Will check C. difficile, GI pathogen panel.  Currently has rectal tube.  ESRD:  Last dialysis on 1/11.  On TTS schedule.  Has AV fistula on the left upper  extremity.  Will consult nephrology  Coronary artery disease: No anginal symptoms.  On Eliquis , Lipitor , Imdur  at home.  Denies chest pain  Paroxysmal A-fib: Currently on normal sinus rhythm.  On Eliquis , amiodarone   Hypertension: Blood pressure stable.  On amlodipine  hydralazine , labetalol , Imdur .  Currently being continued  Insulin -dependent diabetes type 2: Last A1c 5.7.  Currently on sliding scale  Chronic pain syndrome: Takes fentanyl  patch, gabapentin  and as needed Percocet .  Has bilateral BKA.         DVT prophylaxis: apixaban  (ELIQUIS ) tablet 5 mg     Code Status: Full Code  Family Communication: Discussed with wife on phone on 1/12  Patient status:obs  Patient is from :home  Anticipated discharge un:ynfz  Estimated DC date:1-2 days   Consultants: None  Procedures:None  Antimicrobials:  Anti-infectives (From admission, onward)    Start     Dose/Rate Route Frequency Ordered Stop   09/24/23 1200  vancomycin  (VANCOCIN ) IVPB 1000 mg/200 mL premix        1,000 mg 200 mL/hr over 60 Minutes Intravenous Every T-Th-Sa (Hemodialysis) 09/22/23 0557     09/23/23 1000  ceFEPIme  (MAXIPIME ) 1 g in sodium chloride  0.9 % 100 mL IVPB        1 g 200 mL/hr over 30 Minutes Intravenous Every 24 hours 09/22/23 0557     09/22/23 0600  metroNIDAZOLE  (FLAGYL ) IVPB 500 mg        500 mg 100 mL/hr over 60 Minutes Intravenous 2 times daily 09/22/23 0431 09/29/23 0959   09/22/23 0500  ceFEPIme  (MAXIPIME ) 2 g in sodium chloride  0.9 % 100 mL  IVPB        2 g 200 mL/hr over 30 Minutes Intravenous  Once 09/22/23 0431 09/22/23 0508   09/22/23 0500  vancomycin  (VANCOREADY) IVPB 2000 mg/400 mL        2,000 mg 200 mL/hr over 120 Minutes Intravenous  Once 09/22/23 0431 09/22/23 0847   09/22/23 0400  cefTRIAXone  (ROCEPHIN ) 1 g in sodium chloride  0.9 % 100 mL IVPB  Status:  Discontinued        1 g 200 mL/hr over 30 Minutes Intravenous  Once 09/22/23 0346 09/22/23 0431   09/22/23 0400   azithromycin  (ZITHROMAX ) 500 mg in sodium chloride  0.9 % 250 mL IVPB  Status:  Discontinued        500 mg 250 mL/hr over 60 Minutes Intravenous  Once 09/22/23 0346 09/22/23 0431       Subjective: Patient seen and examined at bedside today.  Hemodynamically stable lying in bed.  He looks comfortable today.  Blood pressure stable.  Afebrile.  He is alert and oriented.  He is having some diarrhea since yesterday.  Any abdominal pain, nausea or vomiting.  Objective: Vitals:   09/23/23 0009 09/23/23 0406 09/23/23 0630 09/23/23 0812  BP: (!) 126/39 (!) 144/58 (!) 145/55 (!) 118/24  Pulse: 64 69 71 73  Resp: 19 19  18   Temp: 98.3 F (36.8 C) 98.3 F (36.8 C)  98.2 F (36.8 C)  TempSrc: Oral Oral  Oral  SpO2: 100% 100% 98% 97%  Weight:      Height:        Intake/Output Summary (Last 24 hours) at 09/23/2023 1303 Last data filed at 09/23/2023 0530 Gross per 24 hour  Intake 730 ml  Output 430 ml  Net 300 ml   Filed Weights   09/22/23 0125  Weight: 122.5 kg    Examination:  General exam: Overall comfortable, not in distress HEENT: PERRL Respiratory system:  no wheezes or crackles  Cardiovascular system: S1 & S2 heard, RRR.  Gastrointestinal system: Abdomen is nondistended, soft and nontender. Central nervous system: Alert and oriented Extremities: No edema, no clubbing ,no cyanosis, AV fistula on the left upper extremity Skin: Bilateral BKA   Data Reviewed: I have personally reviewed following labs and imaging studies  CBC: Recent Labs  Lab 09/22/23 0123 09/22/23 0524 09/23/23 0501  WBC 9.3 7.6 6.4  NEUTROABS 8.3*  --   --   HGB 11.7* 10.9* 9.8*  HCT 35.4* 33.4* 29.7*  MCV 93.9 95.4 94.3  PLT 155 149* 134*   Basic Metabolic Panel: Recent Labs  Lab 09/22/23 0123 09/22/23 0524 09/23/23 0501  NA 139 137 135  K 4.2 4.0 3.9  CL 97* 99 97*  CO2 29 28 24   GLUCOSE 172* 140* 124*  BUN 28* 35* 48*  CREATININE 4.27* 4.66* 5.94*  CALCIUM  8.1* 7.8* 8.0*      Recent Results (from the past 240 hours)  Blood culture (routine x 2)     Status: None (Preliminary result)   Collection Time: 09/22/23  1:23 AM   Specimen: BLOOD  Result Value Ref Range Status   Specimen Description BLOOD BLOOD RIGHT ARM  Final   Special Requests   Final    BOTTLES DRAWN AEROBIC AND ANAEROBIC Blood Culture adequate volume   Culture   Final    NO GROWTH 1 DAY Performed at [facility], [address], [place], [place] [address]    Report Status PENDING  Incomplete  Resp panel by RT-PCR (RSV, Flu A&B,  Covid) Anterior Nasal Swab     Status: None   Collection Time: 09/22/23  1:23 AM   Specimen: Anterior Nasal Swab  Result Value Ref Range Status   SARS Coronavirus 2 by RT PCR NEGATIVE NEGATIVE Final   Influenza A by PCR NEGATIVE NEGATIVE Final   Influenza B by PCR NEGATIVE NEGATIVE Final    Comment: (NOTE) The Xpert Xpress SARS-CoV-2/FLU/RSV plus assay is intended as an aid in the diagnosis of influenza from Nasopharyngeal swab specimens and should not be used as a sole basis for treatment. Nasal washings and aspirates are unacceptable for Xpert Xpress SARS-CoV-2/FLU/RSV testing.  Fact Sheet for Patients: bloggercourse.com  Fact Sheet for Healthcare Providers: seriousbroker.it  This test is not yet approved or cleared by the [place]  FDA and has been authorized for detection and/or diagnosis of SARS-CoV-2 by FDA under an Emergency Use Authorization (EUA). This EUA will remain in effect (meaning this test can be used) for the duration of the COVID-19 declaration under Section 564(b)(1) of the Act, 21 U.S.C. section 360bbb-3(b)(1), unless the authorization is terminated or revoked.     Resp Syncytial Virus by PCR NEGATIVE NEGATIVE Final    Comment: (NOTE) Fact Sheet for Patients: bloggercourse.com  Fact Sheet for Healthcare  Providers: seriousbroker.it  This test is not yet approved or cleared by the [place]  FDA and has been authorized for detection and/or diagnosis of SARS-CoV-2 by FDA under an Emergency Use Authorization (EUA). This EUA will remain in effect (meaning this test can be used) for the duration of the COVID-19 declaration under Section 564(b)(1) of the Act, 21 U.S.C. section 360bbb-3(b)(1), unless the authorization is terminated or revoked.  Performed at [facility], [address] [place], [place] [address]   Blood culture (routine x 2)     Status: None (Preliminary result)   Collection Time: 09/22/23  1:28 AM   Specimen: BLOOD  Result Value Ref Range Status   Specimen Description BLOOD BLOOD RIGHT ARM  Final   Special Requests   Final    BOTTLES DRAWN AEROBIC AND ANAEROBIC Blood Culture results may not be optimal due to an inadequate volume of blood received in culture bottles   Culture   Final    NO GROWTH 1 DAY Performed at [facility], [address] [place], [place] [address]    Report Status PENDING  Incomplete     Radiology Studies: DG Chest Port 1 View Result Date: 09/22/2023 CLINICAL DATA:  Sepsis EXAM: PORTABLE CHEST 1 VIEW COMPARISON:  CT chest dated 07/18/2023 FINDINGS: Mild bibasilar atelectasis/scarring. No pleural effusion or pneumothorax. The heart is normal in size.  Thoracic aortic calcifications. IMPRESSION: No acute cardiopulmonary disease. Mild bibasilar atelectasis/scarring. Electronically Signed   By: Pinkie Pebbles M.D.   On: 09/22/2023 02:37    Scheduled Meds:  amiodarone   200 mg Oral Daily   amLODipine   10 mg Oral Daily   apixaban   5 mg Oral BID   atorvastatin   40 mg Oral QHS   famotidine   20 mg Oral QODAY   fentaNYL   1 patch Transdermal Q72H   gabapentin   100 mg Oral TID   [name]'s butt cream   Topical Daily   hydrALAZINE   50 mg Oral Q8H   insulin  aspart  0-5 Units Subcutaneous QHS   insulin  aspart   0-6 Units Subcutaneous TID WC   isosorbide  mononitrate  60 mg Oral Daily   labetalol   200 mg Oral BID   rOPINIRole   2 mg Oral  QHS   senna-docusate  2 tablet Oral QHS   sodium chloride  flush  3 mL Intravenous Q12H   tamsulosin   0.4 mg Oral Daily   Continuous Infusions:  ceFEPime  (MAXIPIME ) IV 1 g (09/23/23 1057)   metronidazole  500 mg (09/23/23 0916)   [START ON 09/24/2023] vancomycin        LOS: 0 days   Ivonne Mustache, MD [facility] P1/13/2025, 1:03 PM

## 2023-09-23 NOTE — Progress Notes (Signed)
 Requested to see pt due to pt's need for HD tomorrow and pt for possible d/c tomorrow. Upon chart review, noted pt from snf. Confirmed with CSW that pt is from snf. Pt receives out-pt HD at [facility] on TTS 11:05 am arrival for 11:15 am chair time. CSW has contacted snf to see if snf can transport pt to HD clinic tomorrow if pt is d/c to snf early tomorrow am. Attending and nephrologist aware of this info as well. Will assist as needed.   Randine Mungo Renal Navigator [phone]

## 2023-09-23 NOTE — Plan of Care (Signed)
  Problem: Education: Goal: Ability to describe self-care measures that may prevent or decrease complications (Diabetes Survival Skills Education) will improve Outcome: Progressing Goal: Individualized Educational Video(s) Outcome: Progressing   Problem: Coping: Goal: Ability to adjust to condition or change in health will improve Outcome: Progressing   Problem: Fluid Volume: Goal: Ability to maintain a balanced intake and output will improve Outcome: Progressing   Problem: Health Behavior/Discharge Planning: Goal: Ability to identify and utilize available resources and services will improve Outcome: Progressing Goal: Ability to manage health-related needs will improve Outcome: Progressing   Problem: Metabolic: Goal: Ability to maintain appropriate glucose levels will improve Outcome: Progressing   Problem: Nutritional: Goal: Maintenance of adequate nutrition will improve Outcome: Progressing Goal: Progress toward achieving an optimal weight will improve Outcome: Progressing   Problem: Skin Integrity: Goal: Risk for impaired skin integrity will decrease Outcome: Progressing   Problem: Tissue Perfusion: Goal: Adequacy of tissue perfusion will improve Outcome: Progressing   Problem: Fluid Volume: Goal: Hemodynamic stability will improve Outcome: Progressing   Problem: Clinical Measurements: Goal: Diagnostic test results will improve Outcome: Progressing Goal: Signs and symptoms of infection will decrease Outcome: Progressing   Problem: Respiratory: Goal: Ability to maintain adequate ventilation will improve Outcome: Progressing   Problem: Education: Goal: Knowledge of General Education information will improve Description: Including pain rating scale, medication(s)/side effects and non-pharmacologic comfort measures Outcome: Progressing   Problem: Health Behavior/Discharge Planning: Goal: Ability to manage health-related needs will improve Outcome:  Progressing   Problem: Clinical Measurements: Goal: Ability to maintain clinical measurements within normal limits will improve Outcome: Progressing Goal: Will remain free from infection Outcome: Progressing Goal: Diagnostic test results will improve Outcome: Progressing Goal: Respiratory complications will improve Outcome: Progressing Goal: Cardiovascular complication will be avoided Outcome: Progressing   Problem: Activity: Goal: Risk for activity intolerance will decrease Outcome: Progressing   Problem: Nutrition: Goal: Adequate nutrition will be maintained Outcome: Progressing   Problem: Coping: Goal: Level of anxiety will decrease Outcome: Progressing   Problem: Elimination: Goal: Will not experience complications related to bowel motility Outcome: Progressing Goal: Will not experience complications related to urinary retention Outcome: Progressing   Problem: Pain Management: Goal: General experience of comfort will improve Outcome: Progressing   Problem: Safety: Goal: Ability to remain free from injury will improve Outcome: Progressing   Problem: Skin Integrity: Goal: Risk for impaired skin integrity will decrease Outcome: Progressing

## 2023-09-24 DIAGNOSIS — R6510 Systemic inflammatory response syndrome (SIRS) of non-infectious origin without acute organ dysfunction: Secondary | ICD-10-CM | POA: Diagnosis not present

## 2023-09-24 LAB — BASIC METABOLIC PANEL
Anion gap: 13 (ref 5–15)
BUN: 58 mg/dL — ABNORMAL HIGH (ref 8–23)
CO2: 24 mmol/L (ref 22–32)
Calcium: 7.9 mg/dL — ABNORMAL LOW (ref 8.9–10.3)
Chloride: 97 mmol/L — ABNORMAL LOW (ref 98–111)
Creatinine, Ser: 7 mg/dL — ABNORMAL HIGH (ref 0.61–1.24)
GFR, Estimated: 8 mL/min — ABNORMAL LOW (ref >60)
Glucose, Bld: 124 mg/dL — ABNORMAL HIGH (ref 70–99)
Potassium: 4 mmol/L (ref 3.5–5.1)
Sodium: 134 mmol/L — ABNORMAL LOW (ref 135–145)

## 2023-09-24 LAB — GASTROINTESTINAL PANEL BY PCR, STOOL (REPLACES STOOL CULTURE)

## 2023-09-24 LAB — HEPATITIS B SURFACE ANTIGEN: Hepatitis B Surface Ag: NONREACTIVE

## 2023-09-24 LAB — GLUCOSE, CAPILLARY
Glucose-Capillary: 134 mg/dL — ABNORMAL HIGH (ref 70–99)
Glucose-Capillary: 126 mg/dL — ABNORMAL HIGH (ref 70–99)
Glucose-Capillary: 140 mg/dL — ABNORMAL HIGH (ref 70–99)
Glucose-Capillary: 135 mg/dL — ABNORMAL HIGH (ref 70–99)
Glucose-Capillary: 127 mg/dL — ABNORMAL HIGH (ref 70–99)
Glucose-Capillary: 161 mg/dL — ABNORMAL HIGH (ref 70–99)

## 2023-09-24 NOTE — Progress Notes (Signed)
 PROGRESS NOTE  [name]  FMW:[id number] DOB: 1956/04/30 DOA: 09/22/2023 PCP: Montey Lot, PA-C   Brief Narrative: Patient is a 67 year old male with history of ESRD on dialysis, hypertension, insulin -dependent diabetes type 2, COPD, chronic pain syndrome, peripheral artery disease status post bilateral BKA, coronary artery disease, paroxysmal A-fib on Eliquis  who presented with lethargy, low-grade fever, abdominal discomfort from skilled nursing facility.   On presentation, he was mildly febrile with a stable heart rate and blood pressure.  Lactic acid level was 1.  No acute findings were found on chest x-ray.  Blood cultures obtained.  Started on broad spectrum antibiotics.  Patient reported diarrhea right after admission in the emergency department.  C. difficile, norovirus came out to be positive.  Currently on oral vancomycin . Plan for discharge back to SNF when diarrhea is better  Assessment & Plan:  Principal Problem:   SIRS (systemic inflammatory response syndrome) (HCC) Active Problems:   Decubitus ulcer of buttock   CAD (dz of distal, mid and proximal RCA with implantation of 3 overlapping drug-eluting stent, with elevated troponin   Insulin  dependent type 2 diabetes mellitus (HCC)   Paroxysmal atrial fibrillation (HCC)   Benign hypertension   Peripheral artery disease (HCC)   Chronic back pain   Chronic obstructive pulmonary disease, unspecified (HCC)   ESRD needing dialysis (HCC)   SIRS: Presented with lethargy, low grade fever.  No leukocytosis.  Cultures sent.  Report of UTIs in the past.  Has a stage II sacral wound.  UA was noted to strongly suspicious for UTI.  No report of drainage or foul smell from sacral wound.  Sepsis physiology has resolved.  Cultures have not shown any growth.  Respiratory viral panel negative.  Antibiotics discontinued.  Most likely from C. Difficile  C. difficile/norovirus infection: New problem that started after right after presented to  the hospital.  This is not hospital-acquired C. difficile .  C. difficile, norovirus came out  to be positive.  On rectal tube we will discontinue rectal tube after diarrhea improves.  ESRD:  Last dialysis on 1/11.  On TTS schedule.  Has AV fistula on the left upper extremity.  Nephrology consulted for dialysis today  Coronary artery disease: No anginal symptoms.  On Eliquis , Lipitor , Imdur  at home.  Denies chest pain  Paroxysmal A-fib: Currently on normal sinus rhythm.  On Eliquis , amiodarone   Hypertension: Blood pressure stable.  On amlodipine  hydralazine , labetalol , Imdur .  Currently being continued  Insulin -dependent diabetes type 2: Last A1c 5.7.  Currently on sliding scale  Chronic pain syndrome: Takes fentanyl  patch, gabapentin  and as needed Percocet  at baseline.  Has bilateral BKA.            DVT prophylaxis: apixaban  (ELIQUIS ) tablet 5 mg     Code Status: Full Code  Family Communication: Discussed with wife on phone on 1/14  Patient status:Inpatient  Patient is from :SNF  Anticipated discharge to:SNf  Estimated DC date:1-2 days, awaiting improvement in the diarrhea   Consultants: Nephrology  Procedures:None  Antimicrobials:  Anti-infectives (From admission, onward)    Start     Dose/Rate Route Frequency Ordered Stop   09/24/23 1200  vancomycin  (VANCOCIN ) IVPB 1000 mg/200 mL premix  Status:  Discontinued        1,000 mg 200 mL/hr over 60 Minutes Intravenous Every T-Th-Sa (Hemodialysis) 09/22/23 0557 09/23/23 1659   09/23/23 1800  vancomycin  (VANCOCIN ) 50 mg/mL oral solution SOLN 125 mg        125 mg Oral Every  6 hours 09/23/23 1659 10/03/23 1759   09/23/23 1000  ceFEPIme  (MAXIPIME ) 1 g in sodium chloride  0.9 % 100 mL IVPB  Status:  Discontinued        1 g 200 mL/hr over 30 Minutes Intravenous Every 24 hours 09/22/23 0557 09/23/23 1659   09/22/23 0600  metroNIDAZOLE  (FLAGYL ) IVPB 500 mg  Status:  Discontinued        500 mg 100 mL/hr over 60 Minutes  Intravenous 2 times daily 09/22/23 0431 09/23/23 1659   09/22/23 0500  ceFEPIme  (MAXIPIME ) 2 g in sodium chloride  0.9 % 100 mL IVPB        2 g 200 mL/hr over 30 Minutes Intravenous  Once 09/22/23 0431 09/22/23 0508   09/22/23 0500  vancomycin  (VANCOREADY) IVPB 2000 mg/400 mL        2,000 mg 200 mL/hr over 120 Minutes Intravenous  Once 09/22/23 0431 09/22/23 0847   09/22/23 0400  cefTRIAXone  (ROCEPHIN ) 1 g in sodium chloride  0.9 % 100 mL IVPB  Status:  Discontinued        1 g 200 mL/hr over 30 Minutes Intravenous  Once 09/22/23 0346 09/22/23 0431   09/22/23 0400  azithromycin  (ZITHROMAX ) 500 mg in sodium chloride  0.9 % 250 mL IVPB  Status:  Discontinued        500 mg 250 mL/hr over 60 Minutes Intravenous  Once 09/22/23 0346 09/22/23 0431       Subjective: Patient seen and the bedside today.  Hemodynamically stable lying in bed.  He had few episodes of loose stool this morning.  Rectal tube still in place.  Has some mild abdominal discomfort but not in any kind of distress, looks very comfortable.  Abdomen is soft and nondistended and nontender.  Does not feel ready to go back to nursing facility today  Objective: Vitals:   09/23/23 1711 09/23/23 2150 09/24/23 0506 09/24/23 0803  BP: (!) 122/49 (!) 138/53 (!) 158/67 (!) 158/48  Pulse: 63 66 66 71  Resp:  16 18 18   Temp:  99 F (37.2 C) 98.3 F (36.8 C) 98.2 F (36.8 C)  TempSrc:  Oral    SpO2:  97% 95% 97%  Weight:      Height:        Intake/Output Summary (Last 24 hours) at 09/24/2023 1112 Last data filed at 09/24/2023 1033 Gross per 24 hour  Intake 610 ml  Output 1020 ml  Net -410 ml   Filed Weights   09/22/23 0125  Weight: 122.5 kg    Examination:  General exam: Overall comfortable, not in distress HEENT: PERRL Respiratory system:  no wheezes or crackles  Cardiovascular system: S1 & S2 heard, RRR.  Gastrointestinal system: Abdomen is obese,, soft and nontender. Central nervous system: Alert and  oriented Extremities: No edema, no clubbing ,no cyanosis, AV fistula in the left upper extremity, bilateral BKA Skin: sacral ulcer   Data Reviewed: I have personally reviewed following labs and imaging studies  CBC: Recent Labs  Lab 09/22/23 0123 09/22/23 0524 09/23/23 0501  WBC 9.3 7.6 6.4  NEUTROABS 8.3*  --   --   HGB 11.7* 10.9* 9.8*  HCT 35.4* 33.4* 29.7*  MCV 93.9 95.4 94.3  PLT 155 149* 134*   Basic Metabolic Panel: Recent Labs  Lab 09/22/23 0123 09/22/23 0524 09/23/23 0501 09/24/23 0501  NA 139 137 135 134*  K 4.2 4.0 3.9 4.0  CL 97* 99 97* 97*  CO2 29 28 24 24   GLUCOSE 172* 140* 124* 124*  BUN 28* 35* 48* 58*  CREATININE 4.27* 4.66* 5.94* 7.00*  CALCIUM  8.1* 7.8* 8.0* 7.9*     Recent Results (from the past 240 hours)  Blood culture (routine x 2)     Status: None (Preliminary result)   Collection Time: 09/22/23  1:23 AM   Specimen: BLOOD  Result Value Ref Range Status   Specimen Description BLOOD BLOOD RIGHT ARM  Final   Special Requests   Final    BOTTLES DRAWN AEROBIC AND ANAEROBIC Blood Culture adequate volume   Culture   Final    NO GROWTH 2 DAYS Performed at [facility], [address], [place], [place] [address]    Report Status PENDING  Incomplete  Resp panel by RT-PCR (RSV, Flu A&B, Covid) Anterior Nasal Swab     Status: None   Collection Time: 09/22/23  1:23 AM   Specimen: Anterior Nasal Swab  Result Value Ref Range Status   SARS Coronavirus 2 by RT PCR NEGATIVE NEGATIVE Final   Influenza A by PCR NEGATIVE NEGATIVE Final   Influenza B by PCR NEGATIVE NEGATIVE Final    Comment: (NOTE) The Xpert Xpress SARS-CoV-2/FLU/RSV plus assay is intended as an aid in the diagnosis of influenza from Nasopharyngeal swab specimens and should not be used as a sole basis for treatment. Nasal washings and aspirates are unacceptable for Xpert Xpress SARS-CoV-2/FLU/RSV testing.  Fact Sheet for  Patients: bloggercourse.com  Fact Sheet for Healthcare Providers: seriousbroker.it  This test is not yet approved or cleared by the [place]  FDA and has been authorized for detection and/or diagnosis of SARS-CoV-2 by FDA under an Emergency Use Authorization (EUA). This EUA will remain in effect (meaning this test can be used) for the duration of the COVID-19 declaration under Section 564(b)(1) of the Act, 21 U.S.C. section 360bbb-3(b)(1), unless the authorization is terminated or revoked.     Resp Syncytial Virus by PCR NEGATIVE NEGATIVE Final    Comment: (NOTE) Fact Sheet for Patients: bloggercourse.com  Fact Sheet for Healthcare Providers: seriousbroker.it  This test is not yet approved or cleared by the [place]  FDA and has been authorized for detection and/or diagnosis of SARS-CoV-2 by FDA under an Emergency Use Authorization (EUA). This EUA will remain in effect (meaning this test can be used) for the duration of the COVID-19 declaration under Section 564(b)(1) of the Act, 21 U.S.C. section 360bbb-3(b)(1), unless the authorization is terminated or revoked.  Performed at [facility], [address], [place], [place] [address]   Blood culture (routine x 2)     Status: None (Preliminary result)   Collection Time: 09/22/23  1:28 AM   Specimen: BLOOD  Result Value Ref Range Status   Specimen Description BLOOD BLOOD RIGHT ARM  Final   Special Requests   Final    BOTTLES DRAWN AEROBIC AND ANAEROBIC Blood Culture results may not be optimal due to an inadequate volume of blood received in culture bottles   Culture   Final    NO GROWTH 2 DAYS Performed at [facility], [address] [place], [place] [address]    Report Status PENDING  Incomplete  Gastrointestinal Panel by PCR , Stool     Status: Abnormal   Collection Time: 09/23/23 12:43 PM   Specimen:  STOOL  Result Value Ref Range Status   Campylobacter species NOT DETECTED NOT DETECTED Final   Plesimonas shigelloides NOT DETECTED NOT DETECTED Final   Salmonella species NOT DETECTED NOT DETECTED Final  Yersinia enterocolitica NOT DETECTED NOT DETECTED Final   Vibrio species NOT DETECTED NOT DETECTED Final   Vibrio cholerae NOT DETECTED NOT DETECTED Final   Enteroaggregative E coli (EAEC) NOT DETECTED NOT DETECTED Final   Enteropathogenic E coli (EPEC) NOT DETECTED NOT DETECTED Final   Enterotoxigenic E coli (ETEC) NOT DETECTED NOT DETECTED Final   Shiga like toxin producing E coli (STEC) NOT DETECTED NOT DETECTED Final   Shigella/Enteroinvasive E coli (EIEC) NOT DETECTED NOT DETECTED Final   Cryptosporidium NOT DETECTED NOT DETECTED Final   Cyclospora cayetanensis NOT DETECTED NOT DETECTED Final   Entamoeba histolytica NOT DETECTED NOT DETECTED Final   Giardia lamblia NOT DETECTED NOT DETECTED Final   Adenovirus F40/41 NOT DETECTED NOT DETECTED Final   Astrovirus NOT DETECTED NOT DETECTED Final   Norovirus GI/GII DETECTED (A) NOT DETECTED Final    Comment: RESULT CALLED TO, READ BACK BY AND VERIFIED WITH: SCOTT GENTRY 09/24/23 0952 MW    Rotavirus A NOT DETECTED NOT DETECTED Final   Sapovirus (I, II, IV, and V) NOT DETECTED NOT DETECTED Final    Comment: Performed at [facility], [address] [place], [place] [address]  C Difficile Quick Screen w PCR reflex     Status: Abnormal   Collection Time: 09/23/23 12:43 PM   Specimen: STOOL  Result Value Ref Range Status   C Diff antigen POSITIVE (A) NEGATIVE Final   C Diff toxin POSITIVE (A) NEGATIVE Final   C Diff interpretation Toxin producing C. difficile detected.  Final    Comment: CRITICAL RESULT CALLED TO, READ BACK BY AND VERIFIED WITH: RN SCOTT GENTRY ON 09/23/23 @ 1657 BY DRT Performed at [facility], [address] [place], [place] [address]      Radiology Studies: No results found.   Scheduled  Meds:  amiodarone   200 mg Oral Daily   amLODipine   10 mg Oral Daily   apixaban   5 mg Oral BID   atorvastatin   40 mg Oral QHS   famotidine   20 mg Oral QODAY   fentaNYL   1 patch Transdermal Q72H   gabapentin   100 mg Oral TID   [name]'s butt cream   Topical Daily   hydrALAZINE   50 mg Oral Q8H   insulin  aspart  0-5 Units Subcutaneous QHS   insulin  aspart  0-6 Units Subcutaneous TID WC   isosorbide  mononitrate  60 mg Oral Daily   labetalol   200 mg Oral BID   rOPINIRole   2 mg Oral QHS   senna-docusate  2 tablet Oral QHS   sodium chloride  flush  3 mL Intravenous Q12H   tamsulosin   0.4 mg Oral Daily   vancomycin   125 mg Oral Q6H   Continuous Infusions:     LOS: 1 day   Ivonne Mustache, MD [facility] P1/14/2025, 11:12 AM

## 2023-09-24 NOTE — Consult Note (Signed)
 [name] Admit Date: 09/22/2023 09/24/2023 [name] [name] Requesting Physician:  Jillian MD  Reason for Consult:  ESRD comanagement HPI:  68M ESRD THS at [facility] using AVF presented to the ED 09/21/2022 with lethargy, fever after his dialysis on 09/20/2022.  Started on broad-spectrum antibiotics, noted to have significant diarrhea during acute admission and now diagnosed with CDI and norovirus started on oral vancomycin .  Last HD 1/11, left 0.3 kg above EDW.  Continue with potassium 4.0, BUN 58, bicarbonate 24.  Most recent hemoglobin today of 9.8  Outpatient HD orders: THS, 4 hours; F180; EDW 106.8 kg; BFR 450 DFR 800; 2K 2Ca bath; no heparin ; uses AV F with 15-gauge needles; no recent ESA.  Today he has no complaints.  He has a rectal tube.  PMH Incudes: PAD with history of BKA CAD History of A-fib on apixaban  DM2 Hypertension  I/Os: I/O last 3 completed shifts: In: 1340 [P.O.:1180; Other:60; IV Piggyback:100] Out: 1350 [Urine:700; Stool:650]   ROS Balance of 12 systems is negative w/ exceptions as above  PMH  Past Medical History:  Diagnosis Date   Carotid artery occlusion 11/10/2010   LEFT CAROTID ENDARTERECTOMY   Complication of anesthesia    BP WENT UP AT [facility]    COPD (chronic obstructive pulmonary disease) (HCC)    pt denies this dx as of 06/01/20 - no inhaler    Diabetes mellitus without complication (HCC)    Diverticulitis    Diverticulosis of colon (without mention of hemorrhage)    DJD (degenerative joint disease)    knees/hands/feet/back/neck   ESRD (end stage renal disease) on dialysis (HCC)    Fatty liver    Full dentures    GERD (gastroesophageal reflux disease)    H/O hiatal hernia    History of blood transfusion    with a past surical procedure per patient 06/01/20   Hyperlipidemia    Hypertension    Neuromuscular disorder (HCC)    peripheral neuropathy   Non-pressure chronic ulcer of other part of left foot limited to breakdown of skin (HCC)  11/12/2016   Osteomyelitis (HCC)    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 Atherectomy left popliteal artery July 2012.    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   Unstable angina (HCC) 09/16/2018   Wears glasses    PSH  Past Surgical History:  Procedure Laterality Date   ABDOMINAL AORTOGRAM W/LOWER EXTREMITY N/A 06/23/2021   Procedure: ABDOMINAL AORTOGRAM W/LOWER EXTREMITY;  Surgeon: Elmira Newman PARAS, MD;  [facility];  Service: Cardiovascular;  Laterality: N/A;   AMPUTATION  11/05/2011   Procedure: AMPUTATION RAY;  Surgeon: Norleen Armor, MD;  Location: [facility] OR;  Service: Orthopedics;  Laterality: Right;  Amputation of Right 4&5th Toes   AMPUTATION Left 11/26/2012   Procedure: AMPUTATION RAY;  Surgeon: Norleen Armor, MD;  Location: [facility] OR;  Service: Orthopedics;  Laterality: Left;  fourth ray amputation   AMPUTATION Right 08/27/2014   Procedure: Transmetatarsal Amputation;  Surgeon: Jerona Harden GAILS, MD;  Location: [facility] OR;  Service: Orthopedics;  Laterality: Right;   AMPUTATION Right 01/14/2015   Procedure: AMPUTATION BELOW KNEE;  Surgeon: Jerona Harden GAILS, MD;  Location: [facility] OR;  Service: Orthopedics;  Laterality: Right;   AMPUTATION Left 10/21/2015   Procedure:  Left Foot 5th Ray Amputation;  Surgeon: Jerona Harden GAILS, MD;  Location: [facility] OR;  Service: Orthopedics;  Laterality: Left;   AMPUTATION Left 07/04/2022   Procedure: LEFT BELOW KNEE AMPUTATION;  Surgeon: Harden Jerona GAILS, MD;  Location: [facility] OR;  Service: Orthopedics;  Laterality: Left;   ANTERIOR FUSION CERVICAL SPINE  02/06/06   C4-5, C5-6, C6-7; SURGEON DR. MAX COHEN   AV FISTULA PLACEMENT Left 06/02/2020   Procedure: ARTERIOVENOUS (AV) FISTULA CREATION LEFT;  Surgeon: Sheree Penne Bruckner, MD;  Location: [facility] OR;  Service: Vascular;  Laterality: Left;    BACK SURGERY     x 3   BASCILIC VEIN TRANSPOSITION Left 07/21/2020   Procedure: LEFT UPPER ARM ATERIOVENOUS SUPERFISTULALIZATION;  Surgeon: Sheree Penne Bruckner, MD;  Location: [facility] OR;  Service: Vascular;  Laterality: Left;   BELOW KNEE LEG AMPUTATION Right    CARDIAC CATHETERIZATION  10/31/04   2009   CAROTID ENDARTERECTOMY  11/10/10   CAROTID ENDARTERECTOMY Left 11/10/2010   Subtotal occlusion of left internal carotid artery with left hemispheric transient ischemic attacks.   CAROTID STENT     CARPAL TUNNEL RELEASE Right 10/21/2013   Procedure: RIGHT CARPAL TUNNEL RELEASE;  Surgeon: Arley JONELLE Curia, MD;  Location: [facility];  Service: Orthopedics;  Laterality: Right;   CHOLECYSTECTOMY     COLON SURGERY     COLONOSCOPY     COLOSTOMY REVERSAL  05/21/2018   ileostomy reversal   CYSTOSCOPY WITH STENT PLACEMENT Bilateral 01/13/2018   Procedure: CYSTOSCOPY WITH BILATERAL URETERAL CATHETER PLACEMENT;  Surgeon: Cam Morene ORN, MD;  Location: [facility];  Service: Urology;  Laterality: Bilateral;   ESOPHAGEAL MANOMETRY Bilateral 07/19/2014   Procedure: ESOPHAGEAL MANOMETRY (EM);  Surgeon: Gordy CHRISTELLA Starch, MD;  Location: [facility];  Service: Gastroenterology;  Laterality: Bilateral;   EYE SURGERY Bilateral 2020   cataract   FEMORAL ARTERY STENT     x6   FINGER SURGERY     FOOT SURGERY  04/25/2016    EXCISION BASE 5TH METATARSAL AND PARTIAL CUBOID LEFT FOOT   HERNIA REPAIR     LEFT INGUINAL AND UMBILICAL REPAIRS   HERNIA REPAIR     I & D EXTREMITY Left 04/25/2016   Procedure: EXCISION BASE 5TH METATARSAL AND PARTIAL CUBOID LEFT FOOT;  Surgeon: Jerona GAILS Harden, MD;  [facility];  Service: Orthopedics;  Laterality: Left;   ILEOSTOMY  01/13/2018   Procedure: ILEOSTOMY;  Surgeon: Signe Mitzie LABOR, MD;  Location: [facility];  Service: General;;   ILEOSTOMY CLOSURE N/A 05/21/2018   Procedure: ILEOSTOMY REVERSAL ERAS PATHWAY;  Surgeon: Signe Mitzie LABOR, MD;  Location: [facility];  Service:  General;  Laterality: N/A;   IR RADIOLOGIST EVAL & MGMT  11/19/2017   IR RADIOLOGIST EVAL & MGMT  12/03/2017   IR RADIOLOGIST EVAL & MGMT  12/18/2017   JOINT REPLACEMENT Right 2001   Total knee   LAMINECTOMY     X 3 LUMBAR AND X 2 CERVICAL SPINE OPERATIONS   LAPAROSCOPIC CHOLECYSTECTOMY W/ CHOLANGIOGRAPHY  11/09/04   SURGEON DR. DEWARD CANDIE NULL   LEFT HEART CATH AND CORONARY ANGIOGRAPHY N/A 09/16/2018   Procedure: LEFT HEART CATH AND CORONARY ANGIOGRAPHY;  Surgeon: Elmira Newman PARAS, MD;  [facility];  Service: Cardiovascular;  Laterality: N/A;   LEFT HEART CATHETERIZATION WITH CORONARY ANGIOGRAM N/A 10/29/2014   Procedure: LEFT HEART CATHETERIZATION WITH CORONARY ANGIOGRAM;  Surgeon: Erick JONELLE Bergamo, MD;  Location: [facility] CATH LAB;  Service: Cardiovascular;  Laterality: N/A;  LIGATION OF COMPETING BRANCHES OF ARTERIOVENOUS FISTULA Left 07/21/2020   Procedure: LIGATION OF COMPETING BRANCHES OF LEFT UPPER ARM ARTERIOVENOUS FISTULA;  Surgeon: Sheree Penne Bruckner, MD;  Location: [facility] OR;  Service: Vascular;  Laterality: Left;   LOWER EXTREMITY ANGIOGRAM N/A 03/19/2012   Procedure: LOWER EXTREMITY ANGIOGRAM;  Surgeon: Bruckner JONETTA Cash, MD;  Location: [facility] CATH LAB;  Service: Cardiovascular;  Laterality: N/A;   LOWER EXTREMITY ANGIOGRAPHY N/A 06/20/2021   Procedure: LOWER EXTREMITY ANGIOGRAPHY;  Surgeon: Elmira Newman PARAS, MD;  [facility];  Service: Cardiovascular;  Laterality: N/A;   NECK SURGERY     PARTIAL COLECTOMY N/A 01/13/2018   Procedure: LAPAROSCOPIC ASSISTED   SIGMOID COLECTOMY ILEOSTOMY;  Surgeon: Signe Mitzie LABOR, MD;  Location: [facility];  Service: General;  Laterality: N/A;   PENILE PROSTHESIS IMPLANT  08/14/05   INFRAPUBIC INSERTION OF INFLATABLE PENILE PROSTHESIS; SURGEON DR. JANIT   PENILE PROSTHESIS IMPLANT     PERCUTANEOUS CORONARY STENT INTERVENTION (PCI-S) Right 10/29/2014   Procedure: PERCUTANEOUS CORONARY STENT INTERVENTION (PCI-S);   Surgeon: Erick JONELLE Bergamo, MD;  Location: [facility] CATH LAB;  Service: Cardiovascular;  Laterality: Right;   PERIPHERAL VASCULAR INTERVENTION Left 06/23/2021   Procedure: PERIPHERAL VASCULAR INTERVENTION;  Surgeon: Elmira Newman PARAS, MD;  [facility];  Service: Cardiovascular;  Laterality: Left;   REMOVAL OF PENILE PROSTHESIS N/A 06/14/2021   Procedure: Removal of THREE piece inflatable penile prosthesis;  Surgeon: Carolee Sherwood JONETTA DOUGLAS, MD;  Location: [facility] OR;  Service: Urology;  Laterality: N/A;   SHOULDER ARTHROSCOPY     SPINE SURGERY     TOE AMPUTATION Left    TONSILLECTOMY     TOTAL KNEE ARTHROPLASTY  07/2002   RIGHT KNEE ; SURGEON  DR. [name] ALSO HAD ARTHROSCOPIC RIGHT KNEE IN  10/2001   TOTAL KNEE ARTHROPLASTY     ULNAR NERVE TRANSPOSITION Right 10/21/2013   Procedure: RIGHT ELBOW  ULNAR NERVE DECOMPRESSION;  Surgeon: Arley JONELLE Curia, MD;  Location: [facility];  Service: Orthopedics;  Laterality: Right;   FH  Family History  Problem Relation Age of Onset   Heart disease Father        Before age 19-  CAD, BPG   Diabetes Father        Amputation   Cancer Father        PROSTATE   Hyperlipidemia Father    Hypertension Father    Heart attack Father        Triple BPG   Varicose Veins Father    Cancer Sister        Breast   Hyperlipidemia Sister    Hypertension Sister    Heart attack Brother    Colon cancer Brother    Diabetes Brother    Heart disease Brother 30       A-Fib. Before age 25   Hyperlipidemia Brother    Hypertension Brother    Hypertension Son    Arthritis Other        GRANDMOTHER   Hypertension Other        OTHER FAMILY MEMBERS   SH  reports that he has been smoking cigarettes. He has a 17.5 pack-year smoking history. He has never used smokeless tobacco. He reports that he does not currently use alcohol . He reports that he does not use drugs. Allergies  Allergies  Allergen Reactions   Contrast Media [Iodinated Contrast Media]  Shortness Of Breath and Other (See Comments)    Difficulty breathing and altered  mental status     Ivp Dye [Iodinated Contrast Media] Anaphylaxis, Shortness Of Breath and Other (See Comments)    Breathing problems, altered mental state    Latex Rash and Other (See Comments)    A severe rash appears after the first 24 hours of being placed   Tape Other (See Comments) and Rash    Rash after 1 day of use  Do not leave on longer then 3 days with out be changed   Home medications Prior to Admission medications   Medication Sig Start Date End Date Taking? Authorizing Provider  amiodarone  (PACERONE ) 200 MG tablet Take 1 tablet (200 mg total) by mouth daily. 10/29/22  Yes Ladona Heinz, MD  amLODipine  (NORVASC ) 10 MG tablet Take 10 mg by mouth daily.   Yes [provider]  apixaban  (ELIQUIS ) 5 MG TABS tablet Take 1 tablet (5 mg total) by mouth 2 (two) times daily. 01/14/22 03/09/24 Yes Gonfa, Taye T, MD  atorvastatin  (LIPITOR ) 40 MG tablet Take 40 mg by mouth at bedtime.    Yes [provider]  B Complex-C-Zn-Folic Acid  (DIALYVITE /ZINC ) TABS Take 1 tablet by mouth Every Tuesday,Thursday,and Saturday with dialysis. 10/04/21  Yes [provider]  docusate sodium  (COLACE) 100 MG capsule Take 100 mg by mouth 2 (two) times daily.   Yes [provider]  famotidine  (PEPCID ) 20 MG tablet Take 20 mg by mouth every other day.   Yes [provider]  fentaNYL  (DURAGESIC ) 50 MCG/HR 1 patch every 3 (three) days. 07/25/23  Yes [provider]  folic acid  (FOLVITE ) 1 MG tablet Take 1 mg by mouth daily. 09/18/21  Yes [provider]  gabapentin  (NEURONTIN ) 100 MG capsule Take 100 mg by mouth 3 (three) times daily.   Yes [provider]  hydrALAZINE  (APRESOLINE ) 50 MG tablet Take 1 tablet (50 mg total) by mouth every 8 (eight) hours. Patient taking differently: Take 50 mg by mouth 4 (four) times daily. 04/01/23  Yes Briana Elgin LABOR, MD  Icosapent  Ethyl  0.5 g CAPS Take 1 capsule by mouth daily.   Yes [provider]  insulin  aspart (NOVOLOG  FLEXPEN) 100 UNIT/ML FlexPen Inject 0-12 Units into the skin See admin instructions. Inject 4 units into the skin with meals in addition to sliding scale as needed. Inject as per sliding scale: If 0 - 69 = 0 initiate hypoglycemia orders;  70 - 150 = 0;  151 - 200 = 2;  201 - 250 = 4;  251 - 300 = 6;  301 - 350 = 8;  351 - 400 = 10;  401 - 450 = 12.  If >400, give 12 units, recheck in 1 hour. If still >400, call MD/NP for further orders. Subcutaneously with meals in addition to scheduled 4 unites with meals.   Yes [provider]  insulin  aspart (NOVOLOG ) 100 UNIT/ML injection Inject 4 Units into the skin 3 (three) times daily with meals.   Yes [provider]  isosorbide  mononitrate (IMDUR ) 60 MG 24 hr tablet Take 1 tablet (60 mg total) by mouth daily. 01/21/23 01/08/24 Yes Ladona Heinz, MD  labetalol  (NORMODYNE ) 200 MG tablet Take 1 tablet (200 mg total) by mouth 2 (two) times daily. 04/01/23  Yes Briana Elgin LABOR, MD  lidocaine -prilocaine  (EMLA ) cream Apply 1 application topically See admin instructions. Prior to Dialysis days Tuesday,Thursday and saturday 06/06/21  Yes [provider]  loratadine  (CLARITIN ) 10 MG tablet Take 10 mg by mouth daily.   Yes [provider]  midodrine  (PROAMATINE ) 10 MG tablet Take 10 mg by mouth daily as needed (SBP less than 100.).   Yes [provider]  mineral oil-hydrophilic petrolatum (AQUAPHOR) ointment Apply 1 Application topically as needed for dry skin or irritation (Upper thigh).   Yes [provider]  nitroGLYCERIN  (NITROSTAT ) 0.4 MG SL tablet Place 0.4 mg under the tongue every 5 (five) minutes as needed for chest pain. 01/06/22  Yes [provider]  Nystatin ([name]'S BUTT CREAM) CREA Apply 1 Application topically 3 (three) times daily as needed for irritation. Apply to irritated area on sacrum   Yes  [provider]  nystatin cream (MYCOSTATIN) Apply 1 Application topically daily. Apply to  upper thigh   Yes [provider]  ondansetron  (ZOFRAN ) 4 MG tablet Take 1 tablet (4 mg total) by mouth every 8 (eight) hours as needed for nausea or vomiting. 04/24/23  Yes Ladona Heinz, MD  oxyCODONE -acetaminophen  (PERCOCET /ROXICET) 5-325 MG tablet Take 1 tablet by mouth every 8 (eight) hours as needed for severe pain. 05/31/23  Yes Ghimire, Donalda HERO, MD  promethazine  (PHENERGAN ) 12.5 MG suppository Place 12.5 mg rectally daily as needed for nausea or vomiting.   Yes [provider]  rOPINIRole  (REQUIP ) 2 MG tablet Take 2 mg by mouth at bedtime.   Yes [provider]  senna-docusate (SENOKOT-S) 8.6-50 MG tablet Take 2 tablets by mouth at bedtime.   Yes [provider]  tamsulosin  (FLOMAX ) 0.4 MG CAPS capsule Take 0.4 mg by mouth daily.   Yes [provider]  trolamine salicylate (ASPERCREME) 10 % cream Apply 1 Application topically as needed for muscle pain. Apply to dialysis shunt/port as needed for pain/   Yes [provider]    Current Medications Scheduled Meds:  amiodarone   200 mg Oral Daily   amLODipine   10 mg Oral Daily   apixaban   5 mg Oral BID   atorvastatin   40 mg Oral QHS   famotidine   20 mg Oral QODAY   fentaNYL   1 patch Transdermal Q72H   gabapentin   100 mg Oral TID   [name]'s butt cream   Topical Daily   hydrALAZINE   50 mg Oral Q8H   insulin  aspart  0-5 Units Subcutaneous QHS   insulin  aspart  0-6 Units Subcutaneous TID WC   isosorbide  mononitrate  60 mg Oral Daily   labetalol   200 mg Oral BID   rOPINIRole   2 mg Oral QHS   senna-docusate  2 tablet Oral QHS   sodium chloride  flush  3 mL Intravenous Q12H   tamsulosin   0.4 mg Oral Daily   vancomycin   125 mg Oral Q6H   Continuous Infusions: PRN Meds:.acetaminophen  **OR** acetaminophen , oxyCODONE -acetaminophen   CBC Recent Labs  Lab 09/22/23 0123 09/22/23 0524  09/23/23 0501  WBC 9.3 7.6 6.4  NEUTROABS 8.3*  --   --   HGB 11.7* 10.9* 9.8*  HCT 35.4* 33.4* 29.7*  MCV 93.9 95.4 94.3  PLT 155 149* 134*   Basic Metabolic Panel Recent Labs  Lab 09/22/23 0123 09/22/23 0524 09/23/23 0501 09/24/23 0501  NA 139 137 135 134*  K 4.2 4.0 3.9 4.0  CL 97* 99 97* 97*  CO2 29 28 24 24   GLUCOSE 172* 140* 124* 124*  BUN 28* 35* 48* 58*  CREATININE 4.27* 4.66* 5.94* 7.00*  CALCIUM  8.1* 7.8* 8.0* 7.9*    Physical Exam  Blood pressure (!) 158/48, pulse 71, temperature 98.2 F (36.8 C), resp. rate 18, height 6' 2 (1.88 m), weight  122.5 kg, SpO2 97%. GEN: Chronically ill-appearing, NAD ENT: NCAT EYES: EOMI CV: Regular, normal S1 and S2 PULM: Clear bilaterally ABD: Soft, bowel sounds present SKIN: No rashes or lesions EXT: No edema in lower extremity amputation stumps LUE AVF +B/T   Assessment 63M ESRD iHD THS here with CDI and Norovirus infection  ESRD on HD THS at [facility] using LUE AVF Diarrhea 2/2 CDI + norovirus, started on vancomycin  Anemia: no recent outpt ESA, Hb 9.8 CTM CKD-BMD: Ca 7.9, corrects to normal, no recent P, cont outpt mgmt HTN/Vol: bed weigth seems inaccurate. EDW was correct probably at arrival.  Given #2, not much UF today CAD/PAD hx/o BKA  Plan HD today, 3h, no heparin , 1L UF max, using AVF, 3K bath  [name] Harold Gasman, MD  09/24/2023, 10:52 AM

## 2023-09-24 NOTE — Plan of Care (Signed)
  Problem: Education: Goal: Ability to describe self-care measures that may prevent or decrease complications (Diabetes Survival Skills Education) will improve Outcome: Progressing Goal: Individualized Educational Video(s) Outcome: Progressing   Problem: Coping: Goal: Ability to adjust to condition or change in health will improve Outcome: Progressing   Problem: Fluid Volume: Goal: Ability to maintain a balanced intake and output will improve Outcome: Progressing   Problem: Health Behavior/Discharge Planning: Goal: Ability to identify and utilize available resources and services will improve Outcome: Progressing Goal: Ability to manage health-related needs will improve Outcome: Progressing   Problem: Metabolic: Goal: Ability to maintain appropriate glucose levels will improve Outcome: Progressing   Problem: Nutritional: Goal: Maintenance of adequate nutrition will improve Outcome: Progressing Goal: Progress toward achieving an optimal weight will improve Outcome: Progressing   Problem: Skin Integrity: Goal: Risk for impaired skin integrity will decrease Outcome: Progressing   Problem: Tissue Perfusion: Goal: Adequacy of tissue perfusion will improve Outcome: Progressing   Problem: Fluid Volume: Goal: Hemodynamic stability will improve Outcome: Progressing   Problem: Clinical Measurements: Goal: Diagnostic test results will improve Outcome: Progressing Goal: Signs and symptoms of infection will decrease Outcome: Progressing   Problem: Respiratory: Goal: Ability to maintain adequate ventilation will improve Outcome: Progressing   Problem: Education: Goal: Knowledge of General Education information will improve Description: Including pain rating scale, medication(s)/side effects and non-pharmacologic comfort measures Outcome: Progressing   Problem: Health Behavior/Discharge Planning: Goal: Ability to manage health-related needs will improve Outcome:  Progressing   Problem: Clinical Measurements: Goal: Ability to maintain clinical measurements within normal limits will improve Outcome: Progressing Goal: Will remain free from infection Outcome: Progressing Goal: Diagnostic test results will improve Outcome: Progressing Goal: Respiratory complications will improve Outcome: Progressing Goal: Cardiovascular complication will be avoided Outcome: Progressing   Problem: Activity: Goal: Risk for activity intolerance will decrease Outcome: Progressing   Problem: Nutrition: Goal: Adequate nutrition will be maintained Outcome: Progressing   Problem: Coping: Goal: Level of anxiety will decrease Outcome: Progressing   Problem: Elimination: Goal: Will not experience complications related to bowel motility Outcome: Progressing Goal: Will not experience complications related to urinary retention Outcome: Progressing   Problem: Pain Management: Goal: General experience of comfort will improve Outcome: Progressing   Problem: Safety: Goal: Ability to remain free from injury will improve Outcome: Progressing   Problem: Skin Integrity: Goal: Risk for impaired skin integrity will decrease Outcome: Progressing

## 2023-09-25 ENCOUNTER — Observation Stay (HOSPITAL_COMMUNITY)
Admission: EM | Admit: 2023-09-25 | Discharge: 2023-09-27 | Disposition: A | Discharge status: Skilled Nursing Facility | Payer: Medicare HMO | Attending: Internal Medicine | Admitting: Internal Medicine

## 2023-09-25 ENCOUNTER — Ambulatory Visit: Payer: Medicare HMO | Admitting: Cardiology

## 2023-09-25 DIAGNOSIS — Z794 Long term (current) use of insulin: Secondary | ICD-10-CM | POA: Insufficient documentation

## 2023-09-25 DIAGNOSIS — G894 Chronic pain syndrome: Secondary | ICD-10-CM | POA: Diagnosis not present

## 2023-09-25 DIAGNOSIS — Z79899 Other long term (current) drug therapy: Secondary | ICD-10-CM | POA: Insufficient documentation

## 2023-09-25 DIAGNOSIS — F17210 Nicotine dependence, cigarettes, uncomplicated: Secondary | ICD-10-CM | POA: Diagnosis not present

## 2023-09-25 DIAGNOSIS — R6510 Systemic inflammatory response syndrome (SIRS) of non-infectious origin without acute organ dysfunction: Secondary | ICD-10-CM | POA: Insufficient documentation

## 2023-09-25 DIAGNOSIS — I2510 Atherosclerotic heart disease of native coronary artery without angina pectoris: Secondary | ICD-10-CM | POA: Insufficient documentation

## 2023-09-25 DIAGNOSIS — I120 Hypertensive chronic kidney disease with stage 5 chronic kidney disease or end stage renal disease: Secondary | ICD-10-CM | POA: Insufficient documentation

## 2023-09-25 DIAGNOSIS — E1122 Type 2 diabetes mellitus with diabetic chronic kidney disease: Secondary | ICD-10-CM | POA: Insufficient documentation

## 2023-09-25 DIAGNOSIS — N186 End stage renal disease: Secondary | ICD-10-CM | POA: Diagnosis not present

## 2023-09-25 DIAGNOSIS — Z91040 Latex allergy status: Secondary | ICD-10-CM | POA: Diagnosis not present

## 2023-09-25 DIAGNOSIS — I480 Paroxysmal atrial fibrillation: Secondary | ICD-10-CM | POA: Insufficient documentation

## 2023-09-25 DIAGNOSIS — A0472 Enterocolitis due to Clostridium difficile, not specified as recurrent: Principal | ICD-10-CM | POA: Diagnosis present

## 2023-09-25 LAB — GLUCOSE, CAPILLARY
Glucose-Capillary: 127 mg/dL — ABNORMAL HIGH (ref 70–99)
Glucose-Capillary: 129 mg/dL — ABNORMAL HIGH (ref 70–99)

## 2023-09-25 LAB — CBC
HCT: 29.7 % — ABNORMAL LOW (ref 39.0–52.0)
Hemoglobin: 10.2 g/dL — ABNORMAL LOW (ref 13.0–17.0)
MCH: 30.9 pg (ref 26.0–34.0)
MCHC: 34.3 g/dL (ref 30.0–36.0)
MCV: 90 fL (ref 80.0–100.0)
Platelets: 136 10*3/uL — ABNORMAL LOW (ref 150–400)
RBC: 3.3 MIL/uL — ABNORMAL LOW (ref 4.22–5.81)
RDW: 13.4 % (ref 11.5–15.5)
WBC: 6.8 10*3/uL (ref 4.0–10.5)
nRBC: 0 % (ref 0.0–0.2)

## 2023-09-25 LAB — HEPATITIS B SURFACE ANTIBODY, QUANTITATIVE: Hep B S AB Quant (Post): 9.5 m[IU]/mL — ABNORMAL LOW

## 2023-09-25 LAB — BASIC METABOLIC PANEL
Anion gap: 16 — ABNORMAL HIGH (ref 5–15)
Anion gap: 9 (ref 5–15)
BUN: 70 mg/dL — ABNORMAL HIGH (ref 8–23)
BUN: 37 mg/dL — ABNORMAL HIGH (ref 8–23)
CO2: 22 mmol/L (ref 22–32)
CO2: 27 mmol/L (ref 22–32)
Calcium: 8 mg/dL — ABNORMAL LOW (ref 8.9–10.3)
Calcium: 8.3 mg/dL — ABNORMAL LOW (ref 8.9–10.3)
Chloride: 95 mmol/L — ABNORMAL LOW (ref 98–111)
Chloride: 96 mmol/L — ABNORMAL LOW (ref 98–111)
Creatinine, Ser: 7.74 mg/dL — ABNORMAL HIGH (ref 0.61–1.24)
Creatinine, Ser: 5.14 mg/dL — ABNORMAL HIGH (ref 0.61–1.24)
GFR, Estimated: 7 mL/min — ABNORMAL LOW (ref >60)
GFR, Estimated: 11 mL/min — ABNORMAL LOW (ref >60)
Glucose, Bld: 123 mg/dL — ABNORMAL HIGH (ref 70–99)
Glucose, Bld: 170 mg/dL — ABNORMAL HIGH (ref 70–99)
Potassium: 4 mmol/L (ref 3.5–5.1)
Potassium: 4.4 mmol/L (ref 3.5–5.1)
Sodium: 133 mmol/L — ABNORMAL LOW (ref 135–145)
Sodium: 132 mmol/L — ABNORMAL LOW (ref 135–145)

## 2023-09-25 LAB — CBC WITH DIFFERENTIAL/PLATELET
Abs Immature Granulocytes: 0.03 10*3/uL (ref 0.00–0.07)
Basophils Absolute: 0 10*3/uL (ref 0.0–0.1)
Basophils Relative: 0 %
Eosinophils Absolute: 0.4 10*3/uL (ref 0.0–0.5)
Eosinophils Relative: 5 %
HCT: 32.7 % — ABNORMAL LOW (ref 39.0–52.0)
Hemoglobin: 11.1 g/dL — ABNORMAL LOW (ref 13.0–17.0)
Immature Granulocytes: 0 %
Lymphocytes Relative: 12 %
Lymphs Abs: 0.9 10*3/uL (ref 0.7–4.0)
MCH: 30.7 pg (ref 26.0–34.0)
MCHC: 33.9 g/dL (ref 30.0–36.0)
MCV: 90.6 fL (ref 80.0–100.0)
Monocytes Absolute: 0.5 10*3/uL (ref 0.1–1.0)
Monocytes Relative: 7 %
Neutro Abs: 5.2 10*3/uL (ref 1.7–7.7)
Neutrophils Relative %: 76 %
Platelets: 159 10*3/uL (ref 150–400)
RBC: 3.61 MIL/uL — ABNORMAL LOW (ref 4.22–5.81)
RDW: 13.2 % (ref 11.5–15.5)
WBC: 7 10*3/uL (ref 4.0–10.5)
nRBC: 0 % (ref 0.0–0.2)

## 2023-09-25 LAB — CBG MONITORING, ED: Glucose-Capillary: 139 mg/dL — ABNORMAL HIGH (ref 70–99)

## 2023-09-25 MED ORDER — OXYCODONE-ACETAMINOPHEN 5-325 MG PO TABS
1.0000 | ORAL_TABLET | Freq: Once | ORAL | Status: AC
  Administered 2023-09-25: 1 via ORAL
  Filled 2023-09-25: qty 1

## 2023-09-25 MED ORDER — AMIODARONE HCL 200 MG PO TABS
200.0000 mg | ORAL_TABLET | Freq: Every day | ORAL | Status: DC
  Administered 2023-09-26 – 2023-09-27 (×2): 200 mg via ORAL
  Filled 2023-09-25 (×2): qty 1

## 2023-09-25 MED ORDER — INSULIN ASPART 100 UNIT/ML IJ SOLN: 0.0000 [IU] | Freq: Three times a day (TID) | INTRAMUSCULAR | Status: DC

## 2023-09-25 MED ORDER — ROPINIROLE HCL 0.5 MG PO TABS
2.0000 mg | ORAL_TABLET | Freq: Every day | ORAL | Status: DC
  Administered 2023-09-25 – 2023-09-27 (×3): 2 mg via ORAL
  Filled 2023-09-25: qty 4
  Filled 2023-09-25: qty 2
  Filled 2023-09-25: qty 4

## 2023-09-25 MED ORDER — TAMSULOSIN HCL 0.4 MG PO CAPS
0.4000 mg | ORAL_CAPSULE | Freq: Every day | ORAL | Status: DC
  Administered 2023-09-26 – 2023-09-27 (×2): 0.4 mg via ORAL
  Filled 2023-09-25 (×2): qty 1

## 2023-09-25 MED ORDER — ALBUTEROL SULFATE (2.5 MG/3ML) 0.083% IN NEBU: 2.5000 mg | INHALATION_SOLUTION | Freq: Four times a day (QID) | RESPIRATORY_TRACT | Status: DC | PRN

## 2023-09-25 MED ORDER — INSULIN ASPART 100 UNIT/ML IJ SOLN: 0.0000 [IU] | Freq: Every day | INTRAMUSCULAR | Status: DC

## 2023-09-25 MED ORDER — GABAPENTIN 100 MG PO CAPS
100.0000 mg | ORAL_CAPSULE | Freq: Three times a day (TID) | ORAL | Status: DC
  Administered 2023-09-25 – 2023-09-27 (×7): 100 mg via ORAL
  Filled 2023-09-25 (×7): qty 1

## 2023-09-25 MED ORDER — OXYCODONE HCL 5 MG PO TABS
5.0000 mg | ORAL_TABLET | Freq: Four times a day (QID) | ORAL | Status: DC | PRN
  Administered 2023-09-25 – 2023-09-27 (×6): 5 mg via ORAL
  Filled 2023-09-25 (×6): qty 1

## 2023-09-25 MED ORDER — HYDRALAZINE HCL 50 MG PO TABS
50.0000 mg | ORAL_TABLET | Freq: Three times a day (TID) | ORAL | Status: DC
  Administered 2023-09-25 – 2023-09-27 (×6): 50 mg via ORAL
  Filled 2023-09-25: qty 2
  Filled 2023-09-25 (×4): qty 1
  Filled 2023-09-25: qty 2

## 2023-09-25 MED ORDER — LABETALOL HCL 200 MG PO TABS
200.0000 mg | ORAL_TABLET | Freq: Two times a day (BID) | ORAL | Status: DC
Start: 2023-09-25 — End: 2023-09-28
  Administered 2023-09-25 – 2023-09-27 (×5): 200 mg via ORAL
  Filled 2023-09-25 (×6): qty 1

## 2023-09-25 MED ORDER — ACETAMINOPHEN 325 MG PO TABS
650.0000 mg | ORAL_TABLET | Freq: Four times a day (QID) | ORAL | Status: DC | PRN
  Filled 2023-09-25: qty 2

## 2023-09-25 MED ORDER — VANCOMYCIN HCL 125 MG PO CAPS
125.0000 mg | ORAL_CAPSULE | Freq: Four times a day (QID) | ORAL | Status: DC
  Administered 2023-09-26 – 2023-09-27 (×7): 125 mg via ORAL
  Filled 2023-09-25 (×11): qty 1

## 2023-09-25 MED ORDER — ATORVASTATIN CALCIUM 40 MG PO TABS
40.0000 mg | ORAL_TABLET | Freq: Every day | ORAL | Status: DC
Start: 2023-09-25 — End: 2023-09-28
  Administered 2023-09-25 – 2023-09-27 (×3): 40 mg via ORAL
  Filled 2023-09-25 (×3): qty 1

## 2023-09-25 MED ORDER — FENTANYL 25 MCG/HR TD PT72: 1.0000 | MEDICATED_PATCH | TRANSDERMAL | Status: DC

## 2023-09-25 MED ORDER — FAMOTIDINE 20 MG PO TABS
20.0000 mg | ORAL_TABLET | ORAL | Status: DC
  Administered 2023-09-26: 20 mg via ORAL
  Filled 2023-09-25: qty 1

## 2023-09-25 MED ORDER — HEPARIN SODIUM (PORCINE) 1000 UNIT/ML DIALYSIS: 1000.0000 [IU] | INTRAMUSCULAR | Status: DC | PRN

## 2023-09-25 MED ORDER — BISACODYL 5 MG PO TBEC: 5.0000 mg | DELAYED_RELEASE_TABLET | Freq: Every day | ORAL | Status: DC | PRN

## 2023-09-25 MED ORDER — ISOSORBIDE MONONITRATE ER 60 MG PO TB24
60.0000 mg | ORAL_TABLET | Freq: Every day | ORAL | Status: DC
  Administered 2023-09-26 – 2023-09-27 (×2): 60 mg via ORAL
  Filled 2023-09-25: qty 2
  Filled 2023-09-25: qty 1

## 2023-09-25 MED ORDER — HYDRALAZINE HCL 20 MG/ML IJ SOLN: 10.0000 mg | Freq: Four times a day (QID) | INTRAMUSCULAR | Status: DC | PRN

## 2023-09-25 MED ORDER — POLYETHYLENE GLYCOL 3350 17 G PO PACK: 17.0000 g | PACK | Freq: Every day | ORAL | Status: DC | PRN

## 2023-09-25 MED ORDER — ACETAMINOPHEN 650 MG RE SUPP: 650.0000 mg | Freq: Four times a day (QID) | RECTAL | Status: DC | PRN

## 2023-09-25 MED ORDER — INSULIN ASPART 100 UNIT/ML IJ SOLN
0.0000 [IU] | Freq: Three times a day (TID) | INTRAMUSCULAR | Status: DC
  Administered 2023-09-26: 1 [IU] via SUBCUTANEOUS
  Administered 2023-09-27 (×2): 2 [IU] via SUBCUTANEOUS

## 2023-09-25 MED ORDER — AMLODIPINE BESYLATE 10 MG PO TABS
10.0000 mg | ORAL_TABLET | Freq: Every day | ORAL | Status: DC
  Administered 2023-09-26 – 2023-09-27 (×2): 10 mg via ORAL
  Filled 2023-09-25: qty 1
  Filled 2023-09-25: qty 2

## 2023-09-25 MED ORDER — VANCOMYCIN 50 MG/ML ORAL SOLUTION: 125.0000 mg | Freq: Four times a day (QID) | ORAL | Status: DC

## 2023-09-25 MED ORDER — APIXABAN 5 MG PO TABS
5.0000 mg | ORAL_TABLET | Freq: Two times a day (BID) | ORAL | Status: DC
  Administered 2023-09-25 – 2023-09-27 (×5): 5 mg via ORAL
  Filled 2023-09-25 (×5): qty 1

## 2023-09-25 MED ORDER — ANTICOAGULANT SODIUM CITRATE 4% (200MG/5ML) IV SOLN: 5.0000 mL | Status: DC | PRN

## 2023-09-25 NOTE — ED Provider Notes (Signed)
 Emergency Department Provider Note   I have reviewed the triage vital signs and the nursing notes.   HISTORY  Chief Complaint Needs placement   HPI [name] is a 68 y.o. male presents to the emergency department for reevaluation after being sent home earlier today.  He was previously admitted to the hospitalist service and discharged to [facility].  Upon returning home the staff report that he is unable to go to his outpatient dialysis due to restrictions regarding his C. difficile and requirement of quarantine.  Patient tells me his last diarrhea was this morning.  No fevers.  No abdominal pain.  He is complaining of his chronic back pain.   Past Medical History:  Diagnosis Date   Carotid artery occlusion 11/10/2010   LEFT CAROTID ENDARTERECTOMY   Complication of anesthesia    BP WENT UP AT [facility] "   COPD (chronic obstructive pulmonary disease) (HCC)    pt denies this dx as of 06/01/20 - no inhaler    Diabetes mellitus without complication (HCC)    Diverticulitis    Diverticulosis of colon (without mention of hemorrhage)    DJD (degenerative joint disease)    knees/hands/feet/back/neck   ESRD (end stage renal disease) on dialysis (HCC)    Fatty liver    Full dentures    GERD (gastroesophageal reflux disease)    H/O hiatal hernia    History of blood transfusion    with a past surical procedure per patient 06/01/20   Hyperlipidemia    Hypertension    Neuromuscular disorder (HCC)    peripheral neuropathy   Non-pressure chronic ulcer of other part of left foot limited to breakdown of skin (HCC) 11/12/2016   Osteomyelitis (HCC)    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 Atherectomy left popliteal artery July 2012.    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 ENT: No sore throat. Cardiovascular: Denies chest pain. Respiratory: Denies shortness of breath. Gastrointestinal: No abdominal pain.  No nausea, no vomiting.  No diarrhea.  No constipation. Genitourinary: Negative for dysuria. Musculoskeletal: Negative for back pain. Skin: Negative for rash. Neurological: Negative for headaches, focal weakness or numbness.  ____________________________________________   PHYSICAL EXAM:  VITAL SIGNS: ED Triage Vitals  Encounter Vitals Group     BP 09/25/23 1610 (!) 142/64     Pulse Rate 09/25/23 1610 84     Resp 09/25/23 1610 16     Temp 09/25/23 1610 99.4 F (37.4 C)     Temp Source 09/25/23 1610 Oral     SpO2 09/25/23 1610 97 %   Constitutional: Alert and oriented. Well appearing and in no acute distress. Eyes: Conjunctivae are normal.  Head: Atraumatic. Nose: No congestion/rhinnorhea. Mouth/Throat: Mucous membranes are moist.   Neck: No stridor.   Cardiovascular: Normal rate, regular rhythm. Good peripheral circulation. Grossly normal heart sounds.   Respiratory: Normal respiratory effort.  No retractions. Lungs CTAB. Gastrointestinal: Soft and nontender. No distention.  Musculoskeletal:  No gross deformities of extremities. Neurologic:  Normal speech and language.  Skin:  Skin is warm, dry and intact. No rash noted.   ____________________________________________  PROCEDURES  Procedure(s) performed:   Procedures  None  ____________________________________________   INITIAL IMPRESSION / ASSESSMENT AND PLAN / ED COURSE  Pertinent labs & imaging results that were available during my care of the patient were reviewed by me and considered in my medical decision making (see chart for details).   This patient is Presenting for Evaluation of diarrhea, which does require a range of treatment options, and is a complaint that  involves a moderate risk of morbidity and mortality.  The Differential Diagnoses include c diff colitis, viral enteritis, colitis, etc.  Critical Interventions-    Medications  oxyCODONE -acetaminophen  (PERCOCET /ROXICET) 5-325 MG per tablet 1 tablet (1 tablet Oral Given 09/25/23 1707)    Reassessment after intervention: back pain improved.   Consult complete with [facility]. Plan for re-admit.   Medical Decision Making: Summary:  Patient returns to the emergency department after recent discharge.  He is unable to have outpatient dialysis and thus had to return to the hospital.  I reached out to the medicine service to discuss the situation.  They plan to readmit, coordinate dialysis, discharge when able.   Patient's presentation is most consistent with acute presentation with potential threat to life or bodily function.   Disposition: admit  ____________________________________________  FINAL CLINICAL IMPRESSION(S) / ED DIAGNOSES  Final diagnoses:  C. difficile colitis     Note:  This document was prepared using Dragon voice recognition software and may include unintentional dictation errors.  Abby Hocking, MD, [facility]    Zawadi Aplin, Shereen Dike, MD 09/25/23 [phone]

## 2023-09-25 NOTE — Discharge Summary (Signed)
 Physician Discharge Summary  [name] ZOX:[id number] DOB: [birth year] DOA: 09/22/2023  PCP: Aloha Arnold, PA-C  Admit date: 09/22/2023 Discharge date: 09/25/2023  Admitted From: SNF Disposition:  SnF  Discharge Condition:Stable CODE STATUS:FULL Diet recommendation: Renal  Brief/Interim Summary: Patient is a 68 year old male with history of ESRD on dialysis, hypertension, insulin -dependent diabetes type 2, COPD, chronic pain syndrome, peripheral artery disease status post bilateral BKA, coronary artery disease, paroxysmal A-fib on Eliquis  who presented with lethargy, low-grade fever, abdominal discomfort from skilled nursing facility.   On presentation, he was mildly febrile with a stable heart rate and blood pressure.  Lactic acid level was 1.  No acute findings were found on chest x-ray.  Blood cultures obtained.  Started on broad spectrum antibiotics.  Patient reported diarrhea right after admission in the emergency department.  C. difficile, norovirus came out to be positive.  Currently on oral vancomycin .  Diarrhea better now.  Medically stable for discharge back to skilled nursing facility.  Following problems were addressed during the hospitalization:  SIRS: Presented with lethargy, low grade fever.  No leukocytosis.   Report of UTIs in the past.  Has a stage II sacral wound.  UA was not strongly suspicious for UTI.  No report of drainage or foul smell from sacral wound.  Sepsis physiology has resolved.  Cultures have not shown any growth.  Respiratory viral panel negative.  Antibiotics discontinued. This waslikely from C. Difficile   C. difficile/norovirus infection: New problem that started after right after presented to the hospital.  This is not hospital-acquired C. difficile .  C. difficile, norovirus came out  to be positive.  Diarrhea has improved after he started on oral vancomycin .  Rectal tube will be removed before discharge  ESRD:  On TTS schedule.  Has AV fistula on the  left upper extremity.  Nephrology consulted for dialysis    Coronary artery disease: No anginal symptoms.  On Eliquis , Lipitor, Imdur  at home.  Denies chest pain   Paroxysmal A-fib: Currently on normal sinus rhythm.  On Eliquis , amiodarone    Hypertension: Blood pressure stable.  On amlodipine  hydralazine , labetalol , Imdur .  Currently being continued   Insulin -dependent diabetes type 2: Last A1c 5.7.  Currently on sliding scale   Chronic pain syndrome: Takes fentanyl  patch, gabapentin  and as needed Percocet  at baseline.  Has bilateral BKA.  Discharge Diagnoses:  Principal Problem:   SIRS (systemic inflammatory response syndrome) (HCC) Active Problems:   Decubitus ulcer of buttock   CAD (dz of distal, mid and proximal RCA with implantation of 3 overlapping drug-eluting stent, with elevated troponin   Insulin  dependent type 2 diabetes mellitus (HCC)   Paroxysmal atrial fibrillation (HCC)   Benign hypertension   Peripheral artery disease (HCC)   Chronic back pain   Chronic obstructive pulmonary disease, unspecified (HCC)   ESRD needing dialysis [facility])    Discharge Instructions  Discharge Instructions     Diet - low sodium heart healthy   Complete by: As directed    Discharge instructions   Complete by: As directed    1)Please take your medications as instructed   Discharge wound care:   Complete by: As directed    As per wound care nurse   Increase activity slowly   Complete by: As directed       Allergies as of 09/25/2023       Reactions   Contrast Media [iodinated Contrast Media] Shortness Of Breath, Other (See Comments)   Difficulty breathing and altered mental status  Ivp Dye [iodinated Contrast Media] Anaphylaxis, Shortness Of Breath, Other (See Comments)   Breathing problems, altered mental state   Latex Rash, Other (See Comments)   A severe rash appears after the first 24 hours of being placed   Tape Other (See Comments), Rash   Rash after 1 day of use Do not  leave on longer then 3 days with out be changed        Medication List     STOP taking these medications    docusate sodium  100 MG capsule Commonly known as: COLACE   senna-docusate 8.6-50 MG tablet Commonly known as: Senokot-S       TAKE these medications    amiodarone  200 MG tablet Commonly known as: PACERONE  Take 1 tablet (200 mg total) by mouth daily.   amLODipine  10 MG tablet Commonly known as: NORVASC  Take 10 mg by mouth daily.   apixaban  5 MG Tabs tablet Commonly known as: ELIQUIS  Take 1 tablet (5 mg total) by mouth 2 (two) times daily.   atorvastatin  40 MG tablet Commonly known as: LIPITOR Take 40 mg by mouth at bedtime.   Dialyvite /Zinc  Tabs Take 1 tablet by mouth Every Tuesday,Thursday,and Saturday with dialysis.   famotidine  20 MG tablet Commonly known as: PEPCID  Take 20 mg by mouth every other day.   fentaNYL  50 MCG/HR Commonly known as: DURAGESIC  1 patch every 3 (three) days.   folic acid  1 MG tablet Commonly known as: FOLVITE  Take 1 mg by mouth daily.   gabapentin  100 MG capsule Commonly known as: NEURONTIN  Take 100 mg by mouth 3 (three) times daily.   [name]'s butt cream Crea Apply 1 Application topically 3 (three) times daily as needed for irritation. Apply to irritated area on sacrum   hydrALAZINE  50 MG tablet Commonly known as: APRESOLINE  Take 1 tablet (50 mg total) by mouth every 8 (eight) hours. What changed: when to take this   Icosapent  Ethyl 0.5 g Caps Take 1 capsule by mouth daily.   isosorbide  mononitrate 60 MG 24 hr tablet Commonly known as: IMDUR  Take 1 tablet (60 mg total) by mouth daily.   labetalol  200 MG tablet Commonly known as: NORMODYNE  Take 1 tablet (200 mg total) by mouth 2 (two) times daily.   lidocaine -prilocaine  cream Commonly known as: EMLA  Apply 1 application topically See admin instructions. Prior to Dialysis days Tuesday,Thursday and saturday   loratadine  10 MG tablet Commonly known as:  CLARITIN  Take 10 mg by mouth daily.   midodrine  10 MG tablet Commonly known as: PROAMATINE  Take 10 mg by mouth daily as needed (SBP less than 100.).   mineral oil-hydrophilic petrolatum ointment Apply 1 Application topically as needed for dry skin or irritation (Upper thigh).   nitroGLYCERIN  0.4 MG SL tablet Commonly known as: NITROSTAT  Place 0.4 mg under the tongue every 5 (five) minutes as needed for chest pain.   NovoLOG  FlexPen 100 UNIT/ML FlexPen Generic drug: insulin  aspart Inject 0-12 Units into the skin See admin instructions. Inject 4 units into the skin with meals in addition to sliding scale as needed. Inject as per sliding scale: If 0 - 69 = 0 initiate hypoglycemia orders;  70 - 150 = 0;  151 - 200 = 2;  201 - 250 = 4;  251 - 300 = 6;  301 - 350 = 8;  351 - 400 = 10;  401 - 450 = 12.  If >400, give 12 units, recheck in 1 hour. If still >400, call MD/NP for further orders. Subcutaneously  with meals in addition to scheduled 4 unites with meals.   insulin  aspart 100 UNIT/ML injection Commonly known as: novoLOG  Inject 4 Units into the skin 3 (three) times daily with meals.   nystatin cream Commonly known as: MYCOSTATIN Apply 1 Application topically daily. Apply to  upper thigh   ondansetron  4 MG tablet Commonly known as: Zofran  Take 1 tablet (4 mg total) by mouth every 8 (eight) hours as needed for nausea or vomiting.   oxyCODONE -acetaminophen  5-325 MG tablet Commonly known as: PERCOCET /ROXICET Take 1 tablet by mouth every 8 (eight) hours as needed for severe pain.   promethazine  12.5 MG suppository Commonly known as: PHENERGAN  Place 12.5 mg rectally daily as needed for nausea or vomiting.   rOPINIRole  2 MG tablet Commonly known as: REQUIP  Take 2 mg by mouth at bedtime.   tamsulosin  0.4 MG Caps capsule Commonly known as: FLOMAX  Take 0.4 mg by mouth daily.   trolamine salicylate 10 % cream Commonly known as: ASPERCREME Apply 1 Application topically as  needed for muscle pain. Apply to dialysis shunt/port as needed for pain/   vancomycin  50 mg/mL Soln oral solution Commonly known as: VANCOCIN  Take 2.5 mLs (125 mg total) by mouth every 6 (six) hours for 8 days.               Discharge Care Instructions  (From admission, onward)           Start     Ordered   09/25/23 0000  Discharge wound care:       Comments: As per wound care nurse   09/25/23 1155            Contact information for after-discharge care     Destination     [facility] .   Service: Skilled Nursing Contact information: [address] [place]   [address] [phone]                    Allergies  Allergen Reactions   Contrast Media [Iodinated Contrast Media] Shortness Of Breath and Other (See Comments)    Difficulty breathing and altered mental status     Ivp Dye [Iodinated Contrast Media] Anaphylaxis, Shortness Of Breath and Other (See Comments)    Breathing problems, altered mental state    Latex Rash and Other (See Comments)    A severe rash appears after the first 24 hours of being placed   Tape Other (See Comments) and Rash    Rash after 1 day of use  Do not leave on longer then 3 days with out be changed    Consultations: Nephrology   Procedures/Studies: DG Chest Port 1 View Result Date: 09/22/2023 CLINICAL DATA:  Sepsis EXAM: PORTABLE CHEST 1 VIEW COMPARISON:  CT chest dated 07/18/2023 FINDINGS: Mild bibasilar atelectasis/scarring. No pleural effusion or pneumothorax. The heart is normal in size.  Thoracic aortic calcifications. IMPRESSION: No acute cardiopulmonary disease. Mild bibasilar atelectasis/scarring. Electronically Signed   By: Zadie Herter M.D.   On: 09/22/2023 02:37      Subjective: Patient seen and examined at the dialysis suite.  Comfortable.  Afebrile.  Not in any kind of distress.  Diarrhea has slowed down.  Abdomen is soft, nontender and nondistended.  Medically  stable for discharge to SNF today. I called and discussed with wife about dc planning  Discharge Exam: Vitals:   09/25/23 1100 09/25/23 1130  BP:  (!) 155/66  Pulse: 87 78  Resp: 15 17  Temp:  SpO2: 98% 100%   Vitals:   09/25/23 1000 09/25/23 1030 09/25/23 1100 09/25/23 1130  BP: (!) 156/64 (!) 142/59  (!) 155/66  Pulse: 80 81 87 78  Resp: 18 17 15 17   Temp:      TempSrc:      SpO2: 98% 97% 98% 100%  Weight:      Height:        General: Pt is alert, awake, not in acute distress Cardiovascular: RRR, S1/S2 +, no rubs, no gallops, dialysis cath. Respiratory: CTA bilaterally, no wheezing, no rhonchi Abdominal: Soft, NT, ND, bowel sounds + Extremities: Bilateral BKA    The results of significant diagnostics from this hospitalization (including imaging, microbiology, ancillary and laboratory) are listed below for reference.     Microbiology: Recent Results (from the past 240 hours)  Blood culture (routine x 2)     Status: None (Preliminary result)   Collection Time: 09/22/23  1:23 AM   Specimen: BLOOD  Result Value Ref Range Status   Specimen Description BLOOD BLOOD RIGHT ARM  Final   Special Requests   Final    BOTTLES DRAWN AEROBIC AND ANAEROBIC Blood Culture adequate volume   Culture   Final    NO GROWTH 3 DAYS Performed at [facility], [address] [place], [place] [address]    Report Status PENDING  Incomplete  Resp panel by RT-PCR (RSV, Flu A&B, Covid) Anterior Nasal Swab     Status: None   Collection Time: 09/22/23  1:23 AM   Specimen: Anterior Nasal Swab  Result Value Ref Range Status   SARS Coronavirus 2 by RT PCR NEGATIVE NEGATIVE Final   Influenza A by PCR NEGATIVE NEGATIVE Final   Influenza B by PCR NEGATIVE NEGATIVE Final    Comment: (NOTE) The Xpert Xpress SARS-CoV-2/FLU/RSV plus assay is intended as an aid in the diagnosis of influenza from Nasopharyngeal swab specimens and should not be used as a sole basis for treatment. Nasal washings  and aspirates are unacceptable for Xpert Xpress SARS-CoV-2/FLU/RSV testing.  Fact Sheet for Patients: BloggerCourse.com  Fact Sheet for Healthcare Providers: SeriousBroker.it  This test is not yet approved or cleared by the [place]  FDA and has been authorized for detection and/or diagnosis of SARS-CoV-2 by FDA under an Emergency Use Authorization (EUA). This EUA will remain in effect (meaning this test can be used) for the duration of the COVID-19 declaration under Section 564(b)(1) of the Act, 21 U.S.C. section 360bbb-3(b)(1), unless the authorization is terminated or revoked.     Resp Syncytial Virus by PCR NEGATIVE NEGATIVE Final    Comment: (NOTE) Fact Sheet for Patients: BloggerCourse.com  Fact Sheet for Healthcare Providers: SeriousBroker.it  This test is not yet approved or cleared by the [place]  FDA and has been authorized for detection and/or diagnosis of SARS-CoV-2 by FDA under an Emergency Use Authorization (EUA). This EUA will remain in effect (meaning this test can be used) for the duration of the COVID-19 declaration under Section 564(b)(1) of the Act, 21 U.S.C. section 360bbb-3(b)(1), unless the authorization is terminated or revoked.  Performed at [facility], [address] [place], [place] [address]   Blood culture (routine x 2)     Status: None (Preliminary result)   Collection Time: 09/22/23  1:28 AM   Specimen: BLOOD  Result Value Ref Range Status   Specimen Description BLOOD BLOOD RIGHT ARM  Final   Special Requests   Final    BOTTLES DRAWN AEROBIC AND ANAEROBIC  Blood Culture results may not be optimal due to an inadequate volume of blood received in culture bottles   Culture   Final    NO GROWTH 3 DAYS Performed at [facility], [address], [place], [place] [address]    Report Status PENDING  Incomplete   Gastrointestinal Panel by PCR , Stool     Status: Abnormal   Collection Time: 09/23/23 12:43 PM   Specimen: STOOL  Result Value Ref Range Status   Campylobacter species NOT DETECTED NOT DETECTED Final   Plesimonas shigelloides NOT DETECTED NOT DETECTED Final   Salmonella species NOT DETECTED NOT DETECTED Final   Yersinia enterocolitica NOT DETECTED NOT DETECTED Final   Vibrio species NOT DETECTED NOT DETECTED Final   Vibrio cholerae NOT DETECTED NOT DETECTED Final   Enteroaggregative E coli (EAEC) NOT DETECTED NOT DETECTED Final   Enteropathogenic E coli (EPEC) NOT DETECTED NOT DETECTED Final   Enterotoxigenic E coli (ETEC) NOT DETECTED NOT DETECTED Final   Shiga like toxin producing E coli (STEC) NOT DETECTED NOT DETECTED Final   Shigella/Enteroinvasive E coli (EIEC) NOT DETECTED NOT DETECTED Final   Cryptosporidium NOT DETECTED NOT DETECTED Final   Cyclospora cayetanensis NOT DETECTED NOT DETECTED Final   Entamoeba histolytica NOT DETECTED NOT DETECTED Final   Giardia lamblia NOT DETECTED NOT DETECTED Final   Adenovirus F40/41 NOT DETECTED NOT DETECTED Final   Astrovirus NOT DETECTED NOT DETECTED Final   Norovirus GI/GII DETECTED (A) NOT DETECTED Final    Comment: RESULT CALLED TO, READ BACK BY AND VERIFIED WITH: SCOTT GENTRY 09/24/23 0952 MW    Rotavirus A NOT DETECTED NOT DETECTED Final   Sapovirus (I, II, IV, and V) NOT DETECTED NOT DETECTED Final    Comment: Performed at [facility], [address] [place], [place] [address]  C Difficile Quick Screen w PCR reflex     Status: Abnormal   Collection Time: 09/23/23 12:43 PM   Specimen: STOOL  Result Value Ref Range Status   C Diff antigen POSITIVE (A) NEGATIVE Final   C Diff toxin POSITIVE (A) NEGATIVE Final   C Diff interpretation Toxin producing C. difficile detected.  Final    Comment: CRITICAL RESULT CALLED TO, READ BACK BY AND VERIFIED WITH: RN SCOTT GENTRY ON 09/23/23 @ 1657 BY DRT Performed at [facility], [address] [place], [place] [address]      Labs: BNP (last 3 results) No results for input(s): "BNP" in the last 8760 hours. Basic Metabolic Panel: Recent Labs  Lab 09/22/23 0123 09/22/23 0524 09/23/23 0501 09/24/23 0501 09/25/23 0626  NA 139 137 135 134* 133*  K 4.2 4.0 3.9 4.0 4.0  CL 97* 99 97* 97* 95*  CO2 29 28 24 24 22   GLUCOSE 172* 140* 124* 124* 123*  BUN 28* 35* 48* 58* 70*  CREATININE 4.27* 4.66* 5.94* 7.00* 7.74*  CALCIUM  8.1* 7.8* 8.0* 7.9* 8.0*   Liver Function Tests: Recent Labs  Lab 09/22/23 0123  AST 19  ALT 17  ALKPHOS 96  BILITOT 0.5  PROT 6.1*  ALBUMIN  2.9*   No results for input(s): "LIPASE", "AMYLASE" in the last 168 hours. No results for input(s): "AMMONIA" in the last 168 hours. CBC: Recent Labs  Lab 09/22/23 0123 09/22/23 0524 09/23/23 0501 09/25/23 0626  WBC 9.3 7.6 6.4 6.8  NEUTROABS 8.3*  --   --   --   HGB 11.7* 10.9* 9.8* 10.2*  HCT 35.4* 33.4* 29.7* 29.7*  MCV 93.9 95.4 94.3 90.0  PLT 155 149* 134* 136*   Cardiac Enzymes: No results for input(s): "CKTOTAL", "CKMB", "CKMBINDEX", "TROPONINI" in the last 168 hours. BNP: Invalid input(s): "POCBNP" CBG: Recent Labs  Lab 09/24/23 0803 09/24/23 1203 09/24/23 1556 09/24/23 2127 09/25/23 0743  GLUCAP 140* 135* 127* 161* 127*   D-Dimer No results for input(s): "DDIMER" in the last 72 hours. Hgb A1c No results for input(s): "HGBA1C" in the last 72 hours. Lipid Profile No results for input(s): "CHOL", "HDL", "LDLCALC", "TRIG", "CHOLHDL", "LDLDIRECT" in the last 72 hours. Thyroid  function studies No results for input(s): "TSH", "T4TOTAL", "T3FREE", "THYROIDAB" in the last 72 hours.  Invalid input(s): "FREET3" Anemia work up No results for input(s): "VITAMINB12", "FOLATE", "FERRITIN", "TIBC", "IRON", "RETICCTPCT" in the last 72 hours. Urinalysis    Component Value Date/Time   COLORURINE YELLOW 09/22/2023 1510   APPEARANCEUR CLEAR 09/22/2023 1510   LABSPEC  1.013 09/22/2023 1510   PHURINE 7.0 09/22/2023 1510   GLUCOSEU 150 (A) 09/22/2023 1510   HGBUR NEGATIVE 09/22/2023 1510   BILIRUBINUR NEGATIVE 09/22/2023 1510   KETONESUR NEGATIVE 09/22/2023 1510   PROTEINUR >=300 (A) 09/22/2023 1510   UROBILINOGEN 0.2 01/14/2015 0346   NITRITE NEGATIVE 09/22/2023 1510   LEUKOCYTESUR SMALL (A) 09/22/2023 1510   Sepsis Labs Recent Labs  Lab 09/22/23 0123 09/22/23 0524 09/23/23 0501 09/25/23 0626  WBC 9.3 7.6 6.4 6.8   Microbiology Recent Results (from the past 240 hours)  Blood culture (routine x 2)     Status: None (Preliminary result)   Collection Time: 09/22/23  1:23 AM   Specimen: BLOOD  Result Value Ref Range Status   Specimen Description BLOOD BLOOD RIGHT ARM  Final   Special Requests   Final    BOTTLES DRAWN AEROBIC AND ANAEROBIC Blood Culture adequate volume   Culture   Final    NO GROWTH 3 DAYS Performed at [facility], [address] [place], [place] [address]    Report Status PENDING  Incomplete  Resp panel by RT-PCR (RSV, Flu A&B, Covid) Anterior Nasal Swab     Status: None   Collection Time: 09/22/23  1:23 AM   Specimen: Anterior Nasal Swab  Result Value Ref Range Status   SARS Coronavirus 2 by RT PCR NEGATIVE NEGATIVE Final   Influenza A by PCR NEGATIVE NEGATIVE Final   Influenza B by PCR NEGATIVE NEGATIVE Final    Comment: (NOTE) The Xpert Xpress SARS-CoV-2/FLU/RSV plus assay is intended as an aid in the diagnosis of influenza from Nasopharyngeal swab specimens and should not be used as a sole basis for treatment. Nasal washings and aspirates are unacceptable for Xpert Xpress SARS-CoV-2/FLU/RSV testing.  Fact Sheet for Patients: BloggerCourse.com  Fact Sheet for Healthcare Providers: SeriousBroker.it  This test is not yet approved or cleared by the [place]  FDA and has been authorized for detection and/or diagnosis of SARS-CoV-2 by FDA under an  Emergency Use Authorization (EUA). This EUA will remain in effect (meaning this test can be used) for the duration of the COVID-19 declaration under Section 564(b)(1) of the Act, 21 U.S.C. section 360bbb-3(b)(1), unless the authorization is terminated or revoked.     Resp Syncytial Virus by PCR NEGATIVE NEGATIVE Final    Comment: (NOTE) Fact Sheet for Patients: BloggerCourse.com  Fact Sheet for Healthcare Providers: SeriousBroker.it  This test is not yet approved or cleared by the [place]  FDA and has been authorized for detection and/or diagnosis of SARS-CoV-2 by FDA under an Emergency Use Authorization (  EUA). This EUA will remain in effect (meaning this test can be used) for the duration of the COVID-19 declaration under Section 564(b)(1) of the Act, 21 U.S.C. section 360bbb-3(b)(1), unless the authorization is terminated or revoked.  Performed at [facility], [address] [place], [place] [address]   Blood culture (routine x 2)     Status: None (Preliminary result)   Collection Time: 09/22/23  1:28 AM   Specimen: BLOOD  Result Value Ref Range Status   Specimen Description BLOOD BLOOD RIGHT ARM  Final   Special Requests   Final    BOTTLES DRAWN AEROBIC AND ANAEROBIC Blood Culture results may not be optimal due to an inadequate volume of blood received in culture bottles   Culture   Final    NO GROWTH 3 DAYS Performed at [facility], [address], [place], [place] [address]    Report Status PENDING  Incomplete  Gastrointestinal Panel by PCR , Stool     Status: Abnormal   Collection Time: 09/23/23 12:43 PM   Specimen: STOOL  Result Value Ref Range Status   Campylobacter species NOT DETECTED NOT DETECTED Final   Plesimonas shigelloides NOT DETECTED NOT DETECTED Final   Salmonella species NOT DETECTED NOT DETECTED Final   Yersinia enterocolitica NOT DETECTED NOT DETECTED Final   Vibrio species NOT  DETECTED NOT DETECTED Final   Vibrio cholerae NOT DETECTED NOT DETECTED Final   Enteroaggregative E coli (EAEC) NOT DETECTED NOT DETECTED Final   Enteropathogenic E coli (EPEC) NOT DETECTED NOT DETECTED Final   Enterotoxigenic E coli (ETEC) NOT DETECTED NOT DETECTED Final   Shiga like toxin producing E coli (STEC) NOT DETECTED NOT DETECTED Final   Shigella/Enteroinvasive E coli (EIEC) NOT DETECTED NOT DETECTED Final   Cryptosporidium NOT DETECTED NOT DETECTED Final   Cyclospora cayetanensis NOT DETECTED NOT DETECTED Final   Entamoeba histolytica NOT DETECTED NOT DETECTED Final   Giardia lamblia NOT DETECTED NOT DETECTED Final   Adenovirus F40/41 NOT DETECTED NOT DETECTED Final   Astrovirus NOT DETECTED NOT DETECTED Final   Norovirus GI/GII DETECTED (A) NOT DETECTED Final    Comment: RESULT CALLED TO, READ BACK BY AND VERIFIED WITH: SCOTT GENTRY 09/24/23 0952 MW    Rotavirus A NOT DETECTED NOT DETECTED Final   Sapovirus (I, II, IV, and V) NOT DETECTED NOT DETECTED Final    Comment: Performed at [facility], [address] [place], [place] [address]  C Difficile Quick Screen w PCR reflex     Status: Abnormal   Collection Time: 09/23/23 12:43 PM   Specimen: STOOL  Result Value Ref Range Status   C Diff antigen POSITIVE (A) NEGATIVE Final   C Diff toxin POSITIVE (A) NEGATIVE Final   C Diff interpretation Toxin producing C. difficile detected.  Final    Comment: CRITICAL RESULT CALLED TO, READ BACK BY AND VERIFIED WITH: RN SCOTT GENTRY ON 09/23/23 @ 1657 BY DRT Performed at [facility], [address], [place], [place] [address]     Please note: You were cared for by a hospitalist during your hospital stay. Once you are discharged, your primary care physician will handle any further medical issues. Please note that NO REFILLS for any discharge medications will be authorized once you are discharged, as it is imperative that you return to your primary care physician (or  establish a relationship with a primary care physician if you do not have one) for your post hospital discharge needs  so that they can reassess your need for medications and monitor your lab values.    Time coordinating discharge: 40 minutes  SIGNED:   Leona Rake, MD  [facility] 09/25/2023, 11:56 AM Pager [phone]  If 7PM-7AM, please contact night-coverage [email or web address] Password [email or web address]

## 2023-09-25 NOTE — Progress Notes (Signed)
 D/C order noted. Contacted [facility] to be advised of pt's d/c today and that pt should resume care tomorrow.   Olivia Canter Renal Navigator [phone]

## 2023-09-25 NOTE — H&P (Signed)
 [facility] History and Physical  [name] ZOX:[id number] DOB: 06/03/1956 DOA: 09/25/2023 PCP: Aloha Arnold, PA-C  Presented from: [facility] Chief Complaint: Unable to accept because of C. difficile  History of Present Illness: [name] is a 68 y.o. male with PMH significant for ESRD-HD-TTS, DM2, HTN, PAD s/p bilateral BKA, CAD, paroxysmal A-fib on Eliquis , COPD, chronic pain syndrome. Patient has been living at medical group for about 2 years 1/12, he was sent to ED for lethargy, low-grade fever.  Right after he came to the hospital, he developed diarrhea.  His stool sample tested positive for C. difficile and norovirus.  He was started on oral vancomycin  after his diarrhea stopped and he was discharged back to [facility].  However it turns out that his outpatient dialysis center will not be able to take patient with C. difficile until he completes 14-day isolation.  It seems that this information was learned only when patient reached [facility] and hence they sent him right back to the hospital. Patient has not had any new symptoms in the interval.  Vital signs unremarkable Most recent labs from this morning with WBC count 6.8, hemoglobin 10.2, platelet 136, BUN/creatinine 70/7.74 No new labs or imaging was obtained this afternoon.  At the time of my evaluation, patient was sitting up in bed.  Not in distress.  Frustrated.  No new symptoms to share.  Review of Systems:  All systems were reviewed and were negative unless otherwise mentioned in the HPI   Past medical history: Past Medical History:  Diagnosis Date   Carotid artery occlusion 11/10/2010   LEFT CAROTID ENDARTERECTOMY   Complication of anesthesia    BP WENT UP AT [facility] "   COPD (chronic obstructive pulmonary disease) (HCC)    pt denies this dx as of 06/01/20 - no inhaler    Diabetes mellitus without complication (HCC)    Diverticulitis    Diverticulosis of colon (without mention of  hemorrhage)    DJD (degenerative joint disease)    knees/hands/feet/back/neck   ESRD (end stage renal disease) on dialysis (HCC)    Fatty liver    Full dentures    GERD (gastroesophageal reflux disease)    H/O hiatal hernia    History of blood transfusion    with a past surical procedure per patient 06/01/20   Hyperlipidemia    Hypertension    Neuromuscular disorder (HCC)    peripheral neuropathy   Non-pressure chronic ulcer of other part of left foot limited to breakdown of skin (HCC) 11/12/2016   Osteomyelitis (HCC)    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 Atherectomy left popliteal artery July 2012.    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   Unstable angina (HCC) 09/16/2018   Wears glasses     Past surgical history: Past Surgical History:  Procedure Laterality Date   ABDOMINAL AORTOGRAM W/LOWER EXTREMITY N/A 06/23/2021   Procedure: ABDOMINAL AORTOGRAM W/LOWER EXTREMITY;  Surgeon: Cody Das, MD;  [facility]: [facility];  Service: Cardiovascular;  Laterality: N/A;   AMPUTATION  11/05/2011   Procedure: AMPUTATION RAY;  Surgeon: Amada Backer, MD;  Location: [facility] OR;  Service: Orthopedics;  Laterality: Right;  Amputation of Right 4&5th Toes   AMPUTATION Left 11/26/2012  Procedure: AMPUTATION RAY;  Surgeon: Amada Backer, MD;  Location: [facility] OR;  Service: Orthopedics;  Laterality: Left;  fourth ray amputation   AMPUTATION Right 08/27/2014   Procedure: Transmetatarsal Amputation;  Surgeon: Timothy Ford, MD;  Location: [facility] OR;  Service: Orthopedics;  Laterality: Right;   AMPUTATION Right 01/14/2015   Procedure: AMPUTATION BELOW KNEE;  Surgeon: Timothy Ford, MD;  [facility] OR;  Service: Orthopedics;  Laterality: Right;   AMPUTATION Left 10/21/2015   Procedure: Left Foot 5th Ray Amputation;   Surgeon: Timothy Ford, MD;  Location: [facility] OR;  Service: Orthopedics;  Laterality: Left;   AMPUTATION Left 07/04/2022   Procedure: LEFT BELOW KNEE AMPUTATION;  Surgeon: Timothy Ford, MD;  Location: [facility] OR;  Service: Orthopedics;  Laterality: Left;   ANTERIOR FUSION CERVICAL SPINE  02/06/06   C4-5, C5-6, C6-7; SURGEON DR. MAX COHEN   AV FISTULA PLACEMENT Left 06/02/2020   Procedure: ARTERIOVENOUS (AV) FISTULA CREATION LEFT;  Surgeon: Adine Hoof, MD;  Location: [facility] OR;  Service: Vascular;  Laterality: Left;   BACK SURGERY     x 3   BASCILIC VEIN TRANSPOSITION Left 07/21/2020   Procedure: LEFT UPPER ARM ATERIOVENOUS SUPERFISTULALIZATION;  Surgeon: Adine Hoof, MD;  Location: [facility] OR;  Service: Vascular;  Laterality: Left;   BELOW KNEE LEG AMPUTATION Right    CARDIAC CATHETERIZATION  10/31/04   2009   CAROTID ENDARTERECTOMY  11/10/10   CAROTID ENDARTERECTOMY Left 11/10/2010   Subtotal occlusion of left internal carotid artery with left hemispheric transient ischemic attacks.   CAROTID STENT     CARPAL TUNNEL RELEASE Right 10/21/2013   Procedure: RIGHT CARPAL TUNNEL RELEASE;  Surgeon: Kemp Patter, MD;  Location: [facility];  Service: Orthopedics;  Laterality: Right;   CHOLECYSTECTOMY     COLON SURGERY     COLONOSCOPY     COLOSTOMY REVERSAL  05/21/2018   ileostomy reversal   CYSTOSCOPY WITH STENT PLACEMENT Bilateral 01/13/2018   Procedure: CYSTOSCOPY WITH BILATERAL URETERAL CATHETER PLACEMENT;  Surgeon: Andrez Banker, MD;  Location: [facility] ORS;  Service: Urology;  Laterality: Bilateral;   ESOPHAGEAL MANOMETRY Bilateral 07/19/2014   Procedure: ESOPHAGEAL MANOMETRY (EM);  Surgeon: Nannette Babe, MD;  [facility]: [facility]OS[facility];  Service: Gastroenterology;  Laterality: Bilateral;   EYE SURGERY Bilateral 2020   cataract   FEMORAL ARTERY STENT     x6   FINGER SURGERY     FOOT SURGERY  04/25/2016    EXCISION BASE 5TH METATARSAL AND PARTIAL CUBOID LEFT FOOT   HERNIA  REPAIR     LEFT INGUINAL AND UMBILICAL REPAIRS   HERNIA REPAIR     I & D EXTREMITY Left 04/25/2016   Procedure: EXCISION BASE 5TH METATARSAL AND PARTIAL CUBOID LEFT FOOT;  Surgeon: Timothy Ford, MD;  [facility];  Service: Orthopedics;  Laterality: Left;   ILEOSTOMY  01/13/2018   Procedure: ILEOSTOMY;  Surgeon: Adalberto Acton, MD;  Location: [facility] ORS;  Service: General;;   ILEOSTOMY CLOSURE N/A 05/21/2018   Procedure: ILEOSTOMY REVERSAL ERAS PATHWAY;  Surgeon: Adalberto Acton, MD;  [facility];  Service: General;  Laterality: N/A;   IR RADIOLOGIST EVAL & MGMT  11/19/2017   IR RADIOLOGIST EVAL & MGMT  12/03/2017   IR RADIOLOGIST EVAL & MGMT  12/18/2017   JOINT REPLACEMENT Right 2001   Total knee   LAMINECTOMY     X 3 LUMBAR AND X 2 CERVICAL SPINE OPERATIONS   LAPAROSCOPIC CHOLECYSTECTOMY W/ CHOLANGIOGRAPHY  11/09/04   SURGEON DR. Annetta Barter   LEFT HEART CATH AND CORONARY ANGIOGRAPHY N/A 09/16/2018   Procedure: LEFT HEART CATH AND CORONARY ANGIOGRAPHY;  Surgeon: Cody Das, MD;  [facility];  Service: Cardiovascular;  Laterality: N/A;   LEFT HEART CATHETERIZATION WITH CORONARY ANGIOGRAM N/A 10/29/2014   Procedure: LEFT HEART CATHETERIZATION WITH CORONARY ANGIOGRAM;  Surgeon: Jessica Morn, MD;  Location: [facility] CATH LAB;  Service: Cardiovascular;  Laterality: N/A;   LIGATION OF COMPETING BRANCHES OF ARTERIOVENOUS FISTULA Left 07/21/2020   Procedure: LIGATION OF COMPETING BRANCHES OF LEFT UPPER ARM ARTERIOVENOUS FISTULA;  Surgeon: Adine Hoof, MD;  Location: [facility] OR;  Service: Vascular;  Laterality: Left;   LOWER EXTREMITY ANGIOGRAM N/A 03/19/2012   Procedure: LOWER EXTREMITY ANGIOGRAM;  Surgeon: Odie Benne, MD;  Location: [facility] CATH LAB;  Service: Cardiovascular;  Laterality: N/A;   LOWER EXTREMITY ANGIOGRAPHY N/A 06/20/2021   Procedure: LOWER EXTREMITY ANGIOGRAPHY;  Surgeon: Cody Das, MD;  [facility];  Service:  Cardiovascular;  Laterality: N/A;   NECK SURGERY     PARTIAL COLECTOMY N/A 01/13/2018   Procedure: LAPAROSCOPIC ASSISTED   SIGMOID COLECTOMY ILEOSTOMY;  Surgeon: Adalberto Acton, MD;  Location: [facility] ORS;  Service: General;  Laterality: N/A;   PENILE PROSTHESIS IMPLANT  08/14/05   INFRAPUBIC INSERTION OF INFLATABLE PENILE PROSTHESIS; SURGEON DR. Luster Salters   PENILE PROSTHESIS IMPLANT     PERCUTANEOUS CORONARY STENT INTERVENTION (PCI-S) Right 10/29/2014   Procedure: PERCUTANEOUS CORONARY STENT INTERVENTION (PCI-S);  Surgeon: Jessica Morn, MD;  Location: [facility] CATH LAB;  Service: Cardiovascular;  Laterality: Right;   PERIPHERAL VASCULAR INTERVENTION Left 06/23/2021   Procedure: PERIPHERAL VASCULAR INTERVENTION;  Surgeon: Cody Das, MD;  [facility];  Service: Cardiovascular;  Laterality: Left;   REMOVAL OF PENILE PROSTHESIS N/A 06/14/2021   Procedure: Removal of THREE piece inflatable penile prosthesis;  Surgeon: Samson Croak, MD;  Location: [facility] OR;  Service: Urology;  Laterality: N/A;   SHOULDER ARTHROSCOPY     SPINE SURGERY     TOE AMPUTATION Left    TONSILLECTOMY     TOTAL KNEE ARTHROPLASTY  07/2002   RIGHT KNEE ; SURGEON  DR. [name] ALSO HAD ARTHROSCOPIC RIGHT KNEE IN  10/2001   TOTAL KNEE ARTHROPLASTY     ULNAR NERVE TRANSPOSITION Right 10/21/2013   Procedure: RIGHT ELBOW  ULNAR NERVE DECOMPRESSION;  Surgeon: Kemp Patter, MD;  Location:  SURGERY CENTER;  Service: Orthopedics;  Laterality: Right;    Social History:  reports that he has been smoking cigarettes. He has a 17.5 pack-year smoking history. He has never used smokeless tobacco. He reports that he does not currently use alcohol . He reports that he does not use drugs.  Allergies:  Allergies  Allergen Reactions   Contrast Media [Iodinated Contrast Media] Shortness Of Breath and Other (See Comments)    Difficulty breathing and altered mental status     Ivp Dye [Iodinated Contrast Media]  Anaphylaxis, Shortness Of Breath and Other (See Comments)    Breathing problems, altered mental state    Latex Rash and Other (See Comments)    A severe rash appears after the first 24 hours of being placed   Tape Other (See Comments) and Rash    Rash after 1 day of use  Do not leave on longer then 3 days with out be changed   Contrast media [iodinated contrast media], Ivp dye [iodinated contrast media], Latex,  and Tape   Family history:  Family History  Problem Relation Age of Onset   Heart disease Father        Before age 67-  CAD, BPG   Diabetes Father        Amputation   Cancer Father        PROSTATE   Hyperlipidemia Father    Hypertension Father    Heart attack Father        Triple BPG   Varicose Veins Father    Cancer Sister        Breast   Hyperlipidemia Sister    Hypertension Sister    Heart attack Brother    Colon cancer Brother    Diabetes Brother    Heart disease Brother 33       A-Fib. Before age 92   Hyperlipidemia Brother    Hypertension Brother    Hypertension Son    Arthritis Other        GRANDMOTHER   Hypertension Other        OTHER FAMILY MEMBERS     Physical Exam: Vitals:   09/25/23 1610  BP: (!) 142/64  Pulse: 84  Resp: 16  Temp: 99.4 F (37.4 C)  TempSrc: Oral  SpO2: 97%   Wt Readings from Last 3 Encounters:  09/25/23 104 kg  07/20/23 110.7 kg  05/30/23 (S) 110.4 kg   There is no height or weight on file to calculate BMI.  General exam: Pleasant, elderly Caucasian male. Skin: No rashes, lesions or ulcers. HEENT: Atraumatic, normocephalic, no obvious bleeding Lungs: Clear to auscultation bilaterally,  CVS: S1, S2, no murmur,   GI/Abd: Soft, nontender, nondistended, bowel sound present,   CNS: Alert, awake, oriented x 3. Psychiatry: Frustrated Extremities: Prior bilateral BKA  status   ----------------------------------------------------------------------------------------------------------------------------------------- ----------------------------------------------------------------------------------------------------------------------------------------- -----------------------------------------------------------------------------------------------------------------------------------------  Assessment/Plan:  C. difficile/norovirus diarrhea Diagnosed on stool studies on 1/13 Was started on oral vancomycin  with which diarrhea slowed down. Resume oral vancomycin   ESRD-HD-TTS Nephrology consulted  Type 2 diabetes mellitus A1c 5.7 on 07/19/2023 Start SSI/Accu-Cheks Recent Labs  Lab 09/24/23 1203 09/24/23 1556 09/24/23 2127 09/25/23 0743 09/25/23 1330  GLUCAP 135* 127* 161* 127* 129*   Hypertension Continue amlodipine , hydralazine , labetalol , Imdur   PAD s/p bilateral BKA Continue Eliquis , Lipitor, Imdur   CAD Currently no anginal symptoms Continue Eliquis , Lipitor, Imdur   paroxysmal A-fib Normal sinus rhythm on amiodarone  Continue chronic anticoagulation with Eliquis   Chronic pain syndrome Chronically on fentanyl  patch, gabapentin , Requip , Percocet  as needed  Mobility: Bilateral AKA status  Goals of care:   Code Status: Full Code    DVT prophylaxis: apixaban  (ELIQUIS ) tablet 5 mg   Antimicrobials: Oral vancomycin  Fluid: None Consultants: Nephrology Family Communication: None at bedside  Dispo: The patient is from: [facility]              Anticipated d/c is to: Child psychotherapist involved  Diet: Diet Order             Diet renal/carb modified with fluid restriction Diet-HS Snack? Nothing; Fluid restriction: 1200 mL Fluid; Room service appropriate? Yes; Fluid consistency: Thin  Diet effective now                    ------------------------------------------------------------------------------------- Severity of Illness: The  appropriate patient status for this patient is OBSERVATION. Observation status is judged to be reasonable and necessary in order to provide the required intensity of service to ensure the patient's safety. The patient's presenting symptoms, physical exam findings, and initial radiographic and laboratory  data in the context of their medical condition is felt to place them at decreased risk for further clinical deterioration. Furthermore, it is anticipated that the patient will be medically stable for discharge from the hospital within 2 midnights of admission.  -------------------------------------------------------------------------------------   Home Meds: Prior to Admission medications   Medication Sig Start Date End Date Taking? Authorizing Provider  amiodarone  (PACERONE ) 200 MG tablet Take 1 tablet (200 mg total) by mouth daily. 10/29/22   Knox Perl, MD  amLODipine  (NORVASC ) 10 MG tablet Take 10 mg by mouth daily.    [provider]  apixaban  (ELIQUIS ) 5 MG TABS tablet Take 1 tablet (5 mg total) by mouth 2 (two) times daily. 01/14/22 03/09/24  Gonfa, Taye T, MD  atorvastatin  (LIPITOR) 40 MG tablet Take 40 mg by mouth at bedtime.     [provider]  B Complex-C-Zn-Folic Acid  (DIALYVITE /ZINC ) TABS Take 1 tablet by mouth Every Tuesday,Thursday,and Saturday with dialysis. 10/04/21   [provider]  famotidine  (PEPCID ) 20 MG tablet Take 20 mg by mouth every other day.    [provider]  fentaNYL  (DURAGESIC ) 50 MCG/HR 1 patch every 3 (three) days. 07/25/23   [provider]  folic acid  (FOLVITE ) 1 MG tablet Take 1 mg by mouth daily. 09/18/21   [provider]  gabapentin  (NEURONTIN ) 100 MG capsule Take 100 mg by mouth 3 (three) times daily.    [provider]  hydrALAZINE  (APRESOLINE ) 50 MG tablet Take 1 tablet (50 mg total) by mouth every 8 (eight) hours. Patient taking differently: Take 50 mg by mouth 4 (four) times daily. 04/01/23   Verlyn Goad, MD  Icosapent  Ethyl 0.5 g CAPS Take 1 capsule by mouth daily.    [provider]  insulin  aspart (NOVOLOG  FLEXPEN) 100 UNIT/ML FlexPen Inject 0-12 Units into the skin See admin instructions. Inject 4 units into the skin with meals in addition to sliding scale as needed. Inject as per sliding scale: If 0 - 69 = 0 initiate hypoglycemia orders;  70 - 150 = 0;  151 - 200 = 2;  201 - 250 = 4;  251 - 300 = 6;  301 - 350 = 8;  351 - 400 = 10;  401 - 450 = 12.  If >400, give 12 units, recheck in 1 hour. If still >400, call MD/NP for further orders. Subcutaneously with meals in addition to scheduled 4 unites with meals.    [provider]  insulin  aspart (NOVOLOG ) 100 UNIT/ML injection Inject 4 Units into the skin 3 (three) times daily with meals.    [provider]  isosorbide  mononitrate (IMDUR ) 60 MG 24 hr tablet Take 1 tablet (60 mg total) by mouth daily. 01/21/23 01/08/24  Knox Perl, MD  labetalol  (NORMODYNE ) 200 MG tablet Take 1 tablet (200 mg total) by mouth 2 (two) times daily. 04/01/23   Verlyn Goad, MD  lidocaine -prilocaine  (EMLA ) cream Apply 1 application topically See admin instructions. Prior to Dialysis days Tuesday,Thursday and saturday 06/06/21   [provider]  loratadine  (CLARITIN ) 10 MG tablet Take 10 mg by mouth daily.    [provider]  midodrine  (PROAMATINE ) 10 MG tablet Take 10 mg by mouth daily as needed (SBP less than 100.).    [provider]  mineral oil-hydrophilic petrolatum (AQUAPHOR) ointment Apply 1 Application topically as needed for dry skin or irritation (Upper thigh).    [provider]  nitroGLYCERIN  (NITROSTAT ) 0.4 MG SL tablet Place 0.4 mg  under the tongue every 5 (five) minutes as needed for chest pain. 01/06/22   [provider]  Nystatin ([name]'S BUTT CREAM) CREA Apply 1 Application topically 3 (three) times daily as needed for irritation. Apply to irritated area on sacrum     [provider]  nystatin cream (MYCOSTATIN) Apply 1 Application topically daily. Apply to  upper thigh    [provider]  ondansetron  (ZOFRAN ) 4 MG tablet Take 1 tablet (4 mg total) by mouth every 8 (eight) hours as needed for nausea or vomiting. 04/24/23   Knox Perl, MD  oxyCODONE -acetaminophen  (PERCOCET /ROXICET) 5-325 MG tablet Take 1 tablet by mouth every 8 (eight) hours as needed for severe pain. 05/31/23   Ghimire, Estil Heman, MD  promethazine  (PHENERGAN ) 12.5 MG suppository Place 12.5 mg rectally daily as needed for nausea or vomiting.    [provider]  rOPINIRole  (REQUIP ) 2 MG tablet Take 2 mg by mouth at bedtime.    [provider]  tamsulosin  (FLOMAX ) 0.4 MG CAPS capsule Take 0.4 mg by mouth daily.    [provider]  trolamine salicylate (ASPERCREME) 10 % cream Apply 1 Application topically as needed for muscle pain. Apply to dialysis shunt/port as needed for pain/    [provider]  vancomycin  (VANCOCIN ) 50 mg/mL SOLN oral solution Take 2.5 mLs (125 mg total) by mouth every 6 (six) hours for 8 days. 09/25/23 10/03/23  Leona Rake, MD    Labs on Admission:   CBC: Recent Labs  Lab 09/22/23 0123 09/22/23 0524 09/23/23 0501 09/25/23 0626  WBC 9.3 7.6 6.4 6.8  NEUTROABS 8.3*  --   --   --   HGB 11.7* 10.9* 9.8* 10.2*  HCT 35.4* 33.4* 29.7* 29.7*  MCV 93.9 95.4 94.3 90.0  PLT 155 149* 134* 136*    Basic Metabolic Panel: Recent Labs  Lab 09/22/23 0123 09/22/23 0524 09/23/23 0501 09/24/23 0501 09/25/23 0626  NA 139 137 135 134* 133*  K 4.2 4.0 3.9 4.0 4.0  CL 97* 99 97* 97* 95*  CO2 29 28 24 24 22   GLUCOSE 172* 140* 124* 124* 123*  BUN 28* 35* 48* 58* 70*  CREATININE 4.27* 4.66* 5.94* 7.00* 7.74*  CALCIUM  8.1* 7.8* 8.0* 7.9* 8.0*    Liver Function Tests: Recent Labs  Lab 09/22/23 0123  AST 19  ALT 17  ALKPHOS 96  BILITOT 0.5  PROT 6.1*  ALBUMIN  2.9*   No results for input(s): "LIPASE", "AMYLASE" in  the last 168 hours. No results for input(s): "AMMONIA" in the last 168 hours.  Cardiac Enzymes: No results for input(s): "CKTOTAL", "CKMB", "CKMBINDEX", "TROPONINI" in the last 168 hours.  BNP (last 3 results) No results for input(s): "BNP" in the last 8760 hours.  ProBNP (last 3 results) No results for input(s): "PROBNP" in the last 8760 hours.  CBG: Recent Labs  Lab 09/24/23 1203 09/24/23 1556 09/24/23 2127 09/25/23 0743 09/25/23 1330  GLUCAP 135* 127* 161* 127* 129*    Lipase     Component Value Date/Time   LIPASE 19 05/28/2023 1243     Urinalysis    Component Value Date/Time   COLORURINE YELLOW 09/22/2023 1510   APPEARANCEUR CLEAR 09/22/2023 1510   LABSPEC 1.013 09/22/2023 1510   PHURINE 7.0 09/22/2023 1510   GLUCOSEU 150 (A) 09/22/2023 1510   HGBUR NEGATIVE 09/22/2023 1510   BILIRUBINUR NEGATIVE 09/22/2023 1510   KETONESUR NEGATIVE 09/22/2023 1510   PROTEINUR >=300 (A) 09/22/2023 1510   UROBILINOGEN 0.2 01/14/2015 0346  NITRITE NEGATIVE 09/22/2023 1510   LEUKOCYTESUR SMALL (A) 09/22/2023 1510     Drugs of Abuse     Component Value Date/Time   LABOPIA NONE DETECTED 07/30/2016 1756   COCAINSCRNUR NONE DETECTED 07/30/2016 1756   LABBENZ NONE DETECTED 07/30/2016 1756   AMPHETMU NONE DETECTED 07/30/2016 1756   THCU NONE DETECTED 07/30/2016 1756   LABBARB NONE DETECTED 07/30/2016 1756      Radiological Exams on Admission: No results found.   Signed, Hoyt Macleod, MD [facility] 09/25/2023

## 2023-09-25 NOTE — Plan of Care (Signed)
  Problem: Education: Goal: Ability to describe self-care measures that may prevent or decrease complications (Diabetes Survival Skills Education) will improve Outcome: Progressing Goal: Individualized Educational Video(s) Outcome: Progressing   Problem: Coping: Goal: Ability to adjust to condition or change in health will improve Outcome: Progressing   Problem: Fluid Volume: Goal: Ability to maintain a balanced intake and output will improve Outcome: Progressing   Problem: Health Behavior/Discharge Planning: Goal: Ability to identify and utilize available resources and services will improve Outcome: Progressing Goal: Ability to manage health-related needs will improve Outcome: Progressing   Problem: Metabolic: Goal: Ability to maintain appropriate glucose levels will improve Outcome: Progressing   Problem: Nutritional: Goal: Maintenance of adequate nutrition will improve Outcome: Progressing Goal: Progress toward achieving an optimal weight will improve Outcome: Progressing   Problem: Skin Integrity: Goal: Risk for impaired skin integrity will decrease Outcome: Progressing   Problem: Tissue Perfusion: Goal: Adequacy of tissue perfusion will improve Outcome: Progressing   Problem: Fluid Volume: Goal: Hemodynamic stability will improve Outcome: Progressing   Problem: Clinical Measurements: Goal: Diagnostic test results will improve Outcome: Progressing Goal: Signs and symptoms of infection will decrease Outcome: Progressing   Problem: Respiratory: Goal: Ability to maintain adequate ventilation will improve Outcome: Progressing   Problem: Education: Goal: Knowledge of General Education information will improve Description: Including pain rating scale, medication(s)/side effects and non-pharmacologic comfort measures Outcome: Progressing   Problem: Health Behavior/Discharge Planning: Goal: Ability to manage health-related needs will improve Outcome:  Progressing   Problem: Clinical Measurements: Goal: Ability to maintain clinical measurements within normal limits will improve Outcome: Progressing Goal: Will remain free from infection Outcome: Progressing Goal: Diagnostic test results will improve Outcome: Progressing Goal: Respiratory complications will improve Outcome: Progressing Goal: Cardiovascular complication will be avoided Outcome: Progressing   Problem: Activity: Goal: Risk for activity intolerance will decrease Outcome: Progressing   Problem: Nutrition: Goal: Adequate nutrition will be maintained Outcome: Progressing   Problem: Coping: Goal: Level of anxiety will decrease Outcome: Progressing   Problem: Elimination: Goal: Will not experience complications related to bowel motility Outcome: Progressing Goal: Will not experience complications related to urinary retention Outcome: Progressing   Problem: Pain Management: Goal: General experience of comfort will improve Outcome: Progressing   Problem: Safety: Goal: Ability to remain free from injury will improve Outcome: Progressing   Problem: Skin Integrity: Goal: Risk for impaired skin integrity will decrease Outcome: Progressing

## 2023-09-25 NOTE — ED Provider Triage Note (Signed)
 Emergency Medicine Provider Triage Evaluation Note  [name] , a 68 y.o. male  was evaluated in triage.  Pt complains of continued diarrhea. Patient was discharged from [facility] today with c diff. Plan was to go to [facility] and continue outpatient HD. Patient cannot be transported to HD from [facility] and requires quarantine.   Review of Systems  Positive: diarrhea Negative: Abdominal pain or fever  Physical Exam  BP (!) 142/64 (BP Location: Right Arm)   Pulse 84   Temp 99.4 F (37.4 C) (Oral)   Resp 16   SpO2 97%  Gen:   Awake, no distress   Resp:  Normal effort  MSK:   Moves extremities without difficulty  Other:    Medical Decision Making  Medically screening exam initiated at 4:53 PM.  Appropriate orders placed.  KAYSON NEWBY was informed that the remainder of the evaluation will be completed by another provider, this initial triage assessment does not replace that evaluation, and the importance of remaining in the ED until their evaluation is complete.     Roberts Ching, MD 09/25/23 2312

## 2023-09-25 NOTE — Procedures (Signed)
 HD Note:  Some information was entered later than the data was gathered due to patient care needs. The stated time with the data is accurate.  Received patient in bed to unit.   Alert and oriented.  Patient stated that he has pain related to the flexiseal inserted in his rectum.    Informed consent signed and in chart.   Access used: upper left arm fistula Access issues: No issues  Patient tolerated treatment well.   TX duration: 3 hours  Alert, without acute distress.  Total UF removed: 1000 ml  Hand-off given to patient's nurse.   Transported back to the room   Kit Brubacher L. Alva Jewels, RN [facility].

## 2023-09-25 NOTE — ED Triage Notes (Signed)
 Pt was discharged today from [facility] and was supposed to go to [facility] facility. [facility] brought pt to ED as [facility] refused to accept pt due to current C.diff. Pt is on dialysis [facility] and facility is unable to provide transport to dialysis due to isolation precautions. Needs placement. Per [facility], pt needs to be on isolation precautions for 14 days before going back to facility.

## 2023-09-25 NOTE — TOC Progression Note (Signed)
 Transition of Care [facility]) - Progression Note    Patient Details  Name: [name] MRN: [id number] Date of Birth: 12-Oct-1955  Transition of Care [facility] CM/SW Contact  Loray Akard A [place], [place] Phone Number: 09/25/2023, 4:18 PM  Clinical Narrative:     CSW spoke with Renay Carota at [facility] as CSW was informed of pt's return back to the hospital.   She stated that the facility was fine taking pt back today at discharge. 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 She stated that clinic said that they have a 14 day isolation rule before he would be able to return for dialysis.   She stated that pt can return to [facility] when pt's discharge date is determined.   Expected Discharge Plan: Skilled Nursing Facility Barriers to Discharge: Barriers Resolved  Expected Discharge Plan and Services     Post Acute Care Choice: Skilled Nursing Facility Living arrangements for the past 2 months: Skilled Nursing Facility Expected Discharge Date: 09/25/23                                     Social Determinants of Health (SDOH) Interventions SDOH Screenings   Food Insecurity: No Food Insecurity (09/22/2023)  Housing: Low Risk  (09/22/2023)  Transportation Needs: No Transportation Needs (09/22/2023)  Utilities: Not At Risk (09/22/2023)  Social Connections: Moderately Isolated (09/22/2023)  Tobacco Use: High Risk (09/22/2023)    Readmission Risk Interventions    05/29/2023    4:41 PM 06/30/2021   11:53 AM  Readmission Risk Prevention Plan  Transportation Screening Complete Complete  Medication Review [employer]) Complete Complete  PCP or Specialist appointment within 3-5 days of discharge Complete   HRI or Home Care Consult Complete Complete  SW Recovery Care/Counseling Consult Complete Complete  Palliative Care Screening Not Applicable Not Applicable  Skilled Nursing  Facility Complete Not Applicable

## 2023-09-25 NOTE — Procedures (Signed)
 I was present at this dialysis session. I have reviewed the session and made appropriate changes.   K of 4.0 on 3 K bath.  Using AVF LUE.  UF goal of 1L BP permitting.  Hb stable 10.2.  BPs stable on RA>   Pt says feeling 'a little bit' better.    Filed Weights   09/22/23 0125 09/25/23 0859  Weight: 122.5 kg 104 kg    Recent Labs  Lab 09/25/23 0626  NA 133*  K 4.0  CL 95*  CO2 22  GLUCOSE 123*  BUN 70*  CREATININE 7.74*  CALCIUM  8.0*    Recent Labs  Lab 09/22/23 0123 09/22/23 0524 09/23/23 0501 09/25/23 0626  WBC 9.3 7.6 6.4 6.8  NEUTROABS 8.3*  --   --   --   HGB 11.7* 10.9* 9.8* 10.2*  HCT 35.4* 33.4* 29.7* 29.7*  MCV 93.9 95.4 94.3 90.0  PLT 155 149* 134* 136*    Scheduled Meds:  amiodarone   200 mg Oral Daily   amLODipine   10 mg Oral Daily   apixaban   5 mg Oral BID   atorvastatin   40 mg Oral QHS   famotidine   20 mg Oral QODAY   fentaNYL   1 patch Transdermal Q72H   gabapentin   100 mg Oral TID   [name]'s butt cream   Topical Daily   hydrALAZINE   50 mg Oral Q8H   insulin  aspart  0-5 Units Subcutaneous QHS   insulin  aspart  0-6 Units Subcutaneous TID WC   isosorbide  mononitrate  60 mg Oral Daily   labetalol   200 mg Oral BID   rOPINIRole   2 mg Oral QHS   senna-docusate  2 tablet Oral QHS   sodium chloride  flush  3 mL Intravenous Q12H   tamsulosin   0.4 mg Oral Daily   vancomycin   125 mg Oral Q6H   Continuous Infusions:  anticoagulant sodium citrate      PRN Meds:.acetaminophen  **OR** acetaminophen , anticoagulant sodium citrate , heparin , oxyCODONE -acetaminophen    Fawn Hooks  MD 09/25/2023, 9:43 AM

## 2023-09-26 ENCOUNTER — Encounter (HOSPITAL_COMMUNITY): Payer: Self-pay | Admitting: Internal Medicine

## 2023-09-26 ENCOUNTER — Other Ambulatory Visit: Payer: Self-pay

## 2023-09-26 DIAGNOSIS — A0472 Enterocolitis due to Clostridium difficile, not specified as recurrent: Secondary | ICD-10-CM | POA: Diagnosis not present

## 2023-09-26 LAB — GLUCOSE, CAPILLARY
Glucose-Capillary: 114 mg/dL — ABNORMAL HIGH (ref 70–99)
Glucose-Capillary: 143 mg/dL — ABNORMAL HIGH (ref 70–99)

## 2023-09-26 LAB — CBC
HCT: 28.6 % — ABNORMAL LOW (ref 39.0–52.0)
Hemoglobin: 10 g/dL — ABNORMAL LOW (ref 13.0–17.0)
MCH: 31.8 pg (ref 26.0–34.0)
MCHC: 35 g/dL (ref 30.0–36.0)
MCV: 91.1 fL (ref 80.0–100.0)
Platelets: 192 10*3/uL (ref 150–400)
RBC: 3.14 MIL/uL — ABNORMAL LOW (ref 4.22–5.81)
RDW: 13.5 % (ref 11.5–15.5)
WBC: 6.7 10*3/uL (ref 4.0–10.5)
nRBC: 0 % (ref 0.0–0.2)

## 2023-09-26 LAB — BASIC METABOLIC PANEL
Anion gap: 11 (ref 5–15)
BUN: 44 mg/dL — ABNORMAL HIGH (ref 8–23)
CO2: 25 mmol/L (ref 22–32)
Calcium: 7.9 mg/dL — ABNORMAL LOW (ref 8.9–10.3)
Chloride: 94 mmol/L — ABNORMAL LOW (ref 98–111)
Creatinine, Ser: 5.81 mg/dL — ABNORMAL HIGH (ref 0.61–1.24)
GFR, Estimated: 10 mL/min — ABNORMAL LOW (ref >60)
Glucose, Bld: 143 mg/dL — ABNORMAL HIGH (ref 70–99)
Potassium: 4.4 mmol/L (ref 3.5–5.1)
Sodium: 130 mmol/L — ABNORMAL LOW (ref 135–145)

## 2023-09-26 LAB — CBG MONITORING, ED
Glucose-Capillary: 147 mg/dL — ABNORMAL HIGH (ref 70–99)
Glucose-Capillary: 112 mg/dL — ABNORMAL HIGH (ref 70–99)

## 2023-09-26 MED ORDER — VANCOMYCIN 50 MG/ML ORAL SOLUTION: 125.0000 mg | Freq: Four times a day (QID) | ORAL | Status: AC

## 2023-09-26 NOTE — Discharge Summary (Signed)
Physician Discharge Summary  BETTIE QUEBODEAUX ZOX:[id number] DOB: [birth year] DOA: 09/25/2023  PCP: Lonie Peak, PA-C  Admit date: 09/25/2023 Discharge date: 09/26/2023  Admitted From: SNF Disposition:  SnF  Discharge Condition:Stable CODE STATUS:FULL Diet recommendation: Renal  Brief/Interim Summary: Patient is a 68 year old male with history of ESRD on dialysis, hypertension, insulin-dependent diabetes type 2, COPD, chronic pain syndrome, peripheral artery disease status post bilateral BKA, coronary artery disease, paroxysmal A-fib on Eliquis who presented with lethargy, low-grade fever, abdominal discomfort from skilled nursing facility.   On presentation, he was mildly febrile with a stable heart rate and blood pressure.  Lactic acid level was 1.  No acute findings were found on chest x-ray.  Blood cultures obtained.  Started on broad spectrum antibiotics.  Patient reported diarrhea right after admission in the emergency department.  C. difficile, norovirus came out to be positive.  Currently on oral vancomycin.  He was discharged on 1/15 to his facility but returned by his facility because there was misunderstanding  about contact precaution at his dialysis center.  TOC, nephrology, renal navigator consulted.  Now his facility ready  to accept him again as well as his outpatient dialysis center.  Patient seen and examined at the bedside this morning.  He denies any abdominal pain or diarrhea.  Continue oral vancomycin to finish the remaining course.  Following problems were addressed during the hospitalization:  SIRS: Presented with lethargy, low grade fever.  No leukocytosis.   Report of UTIs in the past.  Has a stage II sacral wound.  UA was not strongly suspicious for UTI.  No report of drainage or foul smell from sacral wound.  Sepsis physiology has resolved.  Cultures have not shown any growth.  Respiratory viral panel negative.  Antibiotics discontinued. This was likely from C. Difficile    C. difficile/norovirus infection: New problem that started after right after presented to the hospital.  This is not hospital-acquired C. difficile .  C. difficile, norovirus came out  to be positive.  Diarrhea  has resolved.  Continue oral vancomycin to finish the course  ESRD:  On TTS schedule.  Has AV fistula on the left upper extremity.  Nephrology consulted for dialysis    Coronary artery disease: No anginal symptoms.  On Eliquis, Lipitor, Imdur at home.  Denies chest pain   Paroxysmal A-fib: Currently on normal sinus rhythm.  On Eliquis, amiodarone   Hypertension: Blood pressure stable.  On amlodipine hydralazine, labetalol, Imdur.  Currently being continued   Insulin-dependent diabetes type 2: Last A1c 5.7.  Currently on sliding scale   Chronic pain syndrome: Takes fentanyl patch, gabapentin and as needed Percocet at baseline.  Has bilateral BKA.  Discharge Diagnoses:  Principal Problem:   C. difficile diarrhea    Discharge Instructions  Discharge Instructions     Diet - low sodium heart healthy   Complete by: As directed    Renal diet   Discharge instructions   Complete by: As directed    1)Please take your medications as instructed   Increase activity slowly   Complete by: As directed       Allergies as of 09/26/2023       Reactions   Contrast Media [iodinated Contrast Media] Shortness Of Breath, Other (See Comments)   Difficulty breathing and altered mental status   Ivp Dye [iodinated Contrast Media] Anaphylaxis, Shortness Of Breath, Other (See Comments)   Breathing problems, altered mental state   Latex Rash, Other (See Comments)   A severe rash  appears after the first 24 hours of being placed   Tape Other (See Comments), Rash   Rash after 1 day of use Do not leave on longer then 3 days with out be changed        Medication List     TAKE these medications    amiodarone 200 MG tablet Commonly known as: PACERONE Take 1 tablet (200 mg total) by mouth  daily.   amLODipine 10 MG tablet Commonly known as: NORVASC Take 10 mg by mouth daily.   apixaban 5 MG Tabs tablet Commonly known as: ELIQUIS Take 1 tablet (5 mg total) by mouth 2 (two) times daily.   atorvastatin 40 MG tablet Commonly known as: LIPITOR Take 40 mg by mouth at bedtime.   Dialyvite/Zinc Tabs Take 1 tablet by mouth Every Tuesday,Thursday,and Saturday with dialysis.   famotidine 20 MG tablet Commonly known as: PEPCID Take 20 mg by mouth every other day.   fentaNYL 50 MCG/HR Commonly known as: DURAGESIC 1 patch every 3 (three) days.   folic acid 1 MG tablet Commonly known as: FOLVITE Take 1 mg by mouth daily.   gabapentin 100 MG capsule Commonly known as: NEURONTIN Take 100 mg by mouth 3 (three) times daily.   [name]'s butt cream Crea Apply 1 Application topically 3 (three) times daily as needed for irritation. Apply to irritated area on sacrum   hydrALAZINE 50 MG tablet Commonly known as: APRESOLINE Take 1 tablet (50 mg total) by mouth every 8 (eight) hours. What changed: when to take this   Icosapent Ethyl 0.5 g Caps Take 1 capsule by mouth daily.   isosorbide mononitrate 60 MG 24 hr tablet Commonly known as: IMDUR Take 1 tablet (60 mg total) by mouth daily.   labetalol 200 MG tablet Commonly known as: NORMODYNE Take 1 tablet (200 mg total) by mouth 2 (two) times daily.   lidocaine-prilocaine cream Commonly known as: EMLA Apply 1 application topically See admin instructions. Prior to Dialysis days Tuesday,Thursday and saturday   loratadine 10 MG tablet Commonly known as: CLARITIN Take 10 mg by mouth daily.   midodrine 10 MG tablet Commonly known as: PROAMATINE Take 10 mg by mouth daily as needed (SBP less than 100.).   mineral oil-hydrophilic petrolatum ointment Apply 1 Application topically as needed for dry skin or irritation (Upper thigh).   nitroGLYCERIN 0.4 MG SL tablet Commonly known as: NITROSTAT Place 0.4 mg under the tongue  every 5 (five) minutes as needed for chest pain.   NovoLOG FlexPen 100 UNIT/ML FlexPen Generic drug: insulin aspart Inject 0-12 Units into the skin See admin instructions. Inject 4 units into the skin with meals in addition to sliding scale as needed. Inject as per sliding scale: If 0 - 69 = 0 initiate hypoglycemia orders;  70 - 150 = 0;  151 - 200 = 2;  201 - 250 = 4;  251 - 300 = 6;  301 - 350 = 8;  351 - 400 = 10;  401 - 450 = 12.  If >400, give 12 units, recheck in 1 hour. If still >400, call MD/NP for further orders. Subcutaneously with meals in addition to scheduled 4 unites with meals.   insulin aspart 100 UNIT/ML injection Commonly known as: novoLOG Inject 4 Units into the skin 3 (three) times daily with meals.   nystatin cream Commonly known as: MYCOSTATIN Apply 1 Application topically daily. Apply to  upper thigh   ondansetron 4 MG tablet Commonly known as: Zofran Take 1  tablet (4 mg total) by mouth every 8 (eight) hours as needed for nausea or vomiting.   oxyCODONE-acetaminophen 5-325 MG tablet Commonly known as: PERCOCET/ROXICET Take 1 tablet by mouth every 8 (eight) hours as needed for severe pain.   promethazine 12.5 MG suppository Commonly known as: PHENERGAN Place 12.5 mg rectally daily as needed for nausea or vomiting.   rOPINIRole 2 MG tablet Commonly known as: REQUIP Take 2 mg by mouth at bedtime.   tamsulosin 0.4 MG Caps capsule Commonly known as: FLOMAX Take 0.4 mg by mouth daily.   trolamine salicylate 10 % cream Commonly known as: ASPERCREME Apply 1 Application topically as needed for muscle pain. Apply to dialysis shunt/port as needed for pain/   vancomycin 50 mg/mL Soln oral solution Commonly known as: VANCOCIN Take 2.5 mLs (125 mg total) by mouth every 6 (six) hours for 30 doses.         Allergies  Allergen Reactions   Contrast Media [Iodinated Contrast Media] Shortness Of Breath and Other (See Comments)    Difficulty breathing and  altered mental status     Ivp Dye [Iodinated Contrast Media] Anaphylaxis, Shortness Of Breath and Other (See Comments)    Breathing problems, altered mental state    Latex Rash and Other (See Comments)    A severe rash appears after the first 24 hours of being placed   Tape Other (See Comments) and Rash    Rash after 1 day of use  Do not leave on longer then 3 days with out be changed    Consultations: Nephrology   Procedures/Studies: DG Chest Port 1 View Result Date: 09/22/2023 CLINICAL DATA:  Sepsis EXAM: PORTABLE CHEST 1 VIEW COMPARISON:  CT chest dated 07/18/2023 FINDINGS: Mild bibasilar atelectasis/scarring. No pleural effusion or pneumothorax. The heart is normal in size.  Thoracic aortic calcifications. IMPRESSION: No acute cardiopulmonary disease. Mild bibasilar atelectasis/scarring. Electronically Signed   By: Charline Bills M.D.   On: 09/22/2023 02:37      Subjective: Patient seen and examined at bedside in the emergency department.  Hemodynamically stable.  Denies any abdomen pain or diarrhea today.  Comfortable  Discharge Exam: Vitals:   09/26/23 0716 09/26/23 1137  BP:  (!) 139/55  Pulse:  (!) 56  Resp:  20  Temp: 97.9 F (36.6 C) 98.3 F (36.8 C)  SpO2:  98%   Vitals:   09/26/23 0700 09/26/23 0716 09/26/23 1137 09/26/23 1341  BP: (!) 129/102  (!) 139/55   Pulse: 86  (!) 56   Resp: 19  20   Temp:  97.9 F (36.6 C) 98.3 F (36.8 C)   TempSrc:  Oral Oral   SpO2: 99%  98%   Weight:    103 kg  Height:    6\' 2"  (1.88 m)    General: Pt is alert, awake, not in acute distress Cardiovascular: RRR, S1/S2 +, no rubs, no gallops, dialysis cath. Respiratory: CTA bilaterally, no wheezing, no rhonchi Abdominal: Soft, NT, ND, bowel sounds + Extremities: Bilateral BKA    The results of significant diagnostics from this hospitalization (including imaging, microbiology, ancillary and laboratory) are listed below for reference.     Microbiology: Recent  Results (from the past 240 hours)  Blood culture (routine x 2)     Status: None (Preliminary result)   Collection Time: 09/22/23  1:23 AM   Specimen: BLOOD  Result Value Ref Range Status   Specimen Description BLOOD BLOOD RIGHT ARM  Final   Special Requests  Final    BOTTLES DRAWN AEROBIC AND ANAEROBIC Blood Culture adequate volume   Culture   Final    NO GROWTH 4 DAYS Performed at [facility], [address], [place], [place] [address]    Report Status PENDING  Incomplete  Resp panel by RT-PCR (RSV, Flu A&B, Covid) Anterior Nasal Swab     Status: None   Collection Time: 09/22/23  1:23 AM   Specimen: Anterior Nasal Swab  Result Value Ref Range Status   SARS Coronavirus 2 by RT PCR NEGATIVE NEGATIVE Final   Influenza A by PCR NEGATIVE NEGATIVE Final   Influenza B by PCR NEGATIVE NEGATIVE Final    Comment: (NOTE) The Xpert Xpress SARS-CoV-2/FLU/RSV plus assay is intended as an aid in the diagnosis of influenza from Nasopharyngeal swab specimens and should not be used as a sole basis for treatment. Nasal washings and aspirates are unacceptable for Xpert Xpress SARS-CoV-2/FLU/RSV testing.  Fact Sheet for Patients: BloggerCourse.com  Fact Sheet for Healthcare Providers: SeriousBroker.it  This test is not yet approved or cleared by the [place] FDA and has been authorized for detection and/or diagnosis of SARS-CoV-2 by FDA under an Emergency Use Authorization (EUA). This EUA will remain in effect (meaning this test can be used) for the duration of the COVID-19 declaration under Section 564(b)(1) of the Act, 21 U.S.C. section 360bbb-3(b)(1), unless the authorization is terminated or revoked.     Resp Syncytial Virus by PCR NEGATIVE NEGATIVE Final    Comment: (NOTE) Fact Sheet for Patients: BloggerCourse.com  Fact Sheet for Healthcare  Providers: SeriousBroker.it  This test is not yet approved or cleared by the [place] FDA and has been authorized for detection and/or diagnosis of SARS-CoV-2 by FDA under an Emergency Use Authorization (EUA). This EUA will remain in effect (meaning this test can be used) for the duration of the COVID-19 declaration under Section 564(b)(1) of the Act, 21 U.S.C. section 360bbb-3(b)(1), unless the authorization is terminated or revoked.  Performed at [facility], [address] [place], [place] [address]   Blood culture (routine x 2)     Status: None (Preliminary result)   Collection Time: 09/22/23  1:28 AM   Specimen: BLOOD  Result Value Ref Range Status   Specimen Description BLOOD BLOOD RIGHT ARM  Final   Special Requests   Final    BOTTLES DRAWN AEROBIC AND ANAEROBIC Blood Culture results may not be optimal due to an inadequate volume of blood received in culture bottles   Culture   Final    NO GROWTH 4 DAYS Performed at [facility], [address] [place], [place] [address]    Report Status PENDING  Incomplete  Gastrointestinal Panel by PCR , Stool     Status: Abnormal   Collection Time: 09/23/23 12:43 PM   Specimen: STOOL  Result Value Ref Range Status   Campylobacter species NOT DETECTED NOT DETECTED Final   Plesimonas shigelloides NOT DETECTED NOT DETECTED Final   Salmonella species NOT DETECTED NOT DETECTED Final   Yersinia enterocolitica NOT DETECTED NOT DETECTED Final   Vibrio species NOT DETECTED NOT DETECTED Final   Vibrio cholerae NOT DETECTED NOT DETECTED Final   Enteroaggregative E coli (EAEC) NOT DETECTED NOT DETECTED Final   Enteropathogenic E coli (EPEC) NOT DETECTED NOT DETECTED Final   Enterotoxigenic E coli (ETEC) NOT DETECTED NOT DETECTED Final   Shiga like toxin producing E coli (STEC) NOT DETECTED NOT DETECTED Final   Shigella/Enteroinvasive E coli (  EIEC) NOT DETECTED NOT DETECTED Final   Cryptosporidium NOT  DETECTED NOT DETECTED Final   Cyclospora cayetanensis NOT DETECTED NOT DETECTED Final   Entamoeba histolytica NOT DETECTED NOT DETECTED Final   Giardia lamblia NOT DETECTED NOT DETECTED Final   Adenovirus F40/41 NOT DETECTED NOT DETECTED Final   Astrovirus NOT DETECTED NOT DETECTED Final   Norovirus GI/GII DETECTED (A) NOT DETECTED Final    Comment: RESULT CALLED TO, READ BACK BY AND VERIFIED WITH: SCOTT GENTRY 09/24/23 0952 MW    Rotavirus A NOT DETECTED NOT DETECTED Final   Sapovirus (I, II, IV, and V) NOT DETECTED NOT DETECTED Final    Comment: Performed at [facility], [address] [place], [place] [address]  C Difficile Quick Screen w PCR reflex     Status: Abnormal   Collection Time: 09/23/23 12:43 PM   Specimen: STOOL  Result Value Ref Range Status   C Diff antigen POSITIVE (A) NEGATIVE Final   C Diff toxin POSITIVE (A) NEGATIVE Final   C Diff interpretation Toxin producing C. difficile detected.  Final    Comment: CRITICAL RESULT CALLED TO, READ BACK BY AND VERIFIED WITH: RN SCOTT GENTRY ON 09/23/23 @ 1657 BY DRT Performed at [facility], [address] [place], [place] [address]      Labs: BNP (last 3 results) No results for input(s): "BNP" in the last 8760 hours. Basic Metabolic Panel: Recent Labs  Lab 09/23/23 0501 09/24/23 0501 09/25/23 0626 09/25/23 1717 09/26/23 0320  NA 135 134* 133* 132* 130*  K 3.9 4.0 4.0 4.4 4.4  CL 97* 97* 95* 96* 94*  CO2 24 24 22 27 25   GLUCOSE 124* 124* 123* 170* 143*  BUN 48* 58* 70* 37* 44*  CREATININE 5.94* 7.00* 7.74* 5.14* 5.81*  CALCIUM 8.0* 7.9* 8.0* 8.3* 7.9*   Liver Function Tests: Recent Labs  Lab 09/22/23 0123  AST 19  ALT 17  ALKPHOS 96  BILITOT 0.5  PROT 6.1*  ALBUMIN 2.9*   No results for input(s): "LIPASE", "AMYLASE" in the last 168 hours. No results for input(s): "AMMONIA" in the last 168 hours. CBC: Recent Labs  Lab 09/22/23 0123 09/22/23 0524 09/23/23 0501 09/25/23 0626  09/25/23 1717 09/26/23 0320  WBC 9.3 7.6 6.4 6.8 7.0 6.7  NEUTROABS 8.3*  --   --   --  5.2  --   HGB 11.7* 10.9* 9.8* 10.2* 11.1* 10.0*  HCT 35.4* 33.4* 29.7* 29.7* 32.7* 28.6*  MCV 93.9 95.4 94.3 90.0 90.6 91.1  PLT 155 149* 134* 136* 159 192   Cardiac Enzymes: No results for input(s): "CKTOTAL", "CKMB", "CKMBINDEX", "TROPONINI" in the last 168 hours. BNP: Invalid input(s): "POCBNP" CBG: Recent Labs  Lab 09/25/23 0743 09/25/23 1330 09/25/23 2303 09/26/23 0925 09/26/23 1145  GLUCAP 127* 129* 139* 147* 112*   D-Dimer No results for input(s): "DDIMER" in the last 72 hours. Hgb A1c No results for input(s): "HGBA1C" in the last 72 hours. Lipid Profile No results for input(s): "CHOL", "HDL", "LDLCALC", "TRIG", "CHOLHDL", "LDLDIRECT" in the last 72 hours. Thyroid function studies No results for input(s): "TSH", "T4TOTAL", "T3FREE", "THYROIDAB" in the last 72 hours.  Invalid input(s): "FREET3" Anemia work up No results for input(s): "VITAMINB12", "FOLATE", "FERRITIN", "TIBC", "IRON", "RETICCTPCT" in the last 72 hours. Urinalysis    Component Value Date/Time   COLORURINE YELLOW 09/22/2023 1510   APPEARANCEUR CLEAR 09/22/2023 1510   LABSPEC 1.013 09/22/2023 1510   PHURINE 7.0 09/22/2023 1510   GLUCOSEU 150 (A)  09/22/2023 1510   HGBUR NEGATIVE 09/22/2023 1510   BILIRUBINUR NEGATIVE 09/22/2023 1510   KETONESUR NEGATIVE 09/22/2023 1510   PROTEINUR >=300 (A) 09/22/2023 1510   UROBILINOGEN 0.2 01/14/2015 0346   NITRITE NEGATIVE 09/22/2023 1510   LEUKOCYTESUR SMALL (A) 09/22/2023 1510   Sepsis Labs Recent Labs  Lab 09/23/23 0501 09/25/23 0626 09/25/23 1717 09/26/23 0320  WBC 6.4 6.8 7.0 6.7   Microbiology Recent Results (from the past 240 hours)  Blood culture (routine x 2)     Status: None (Preliminary result)   Collection Time: 09/22/23  1:23 AM   Specimen: BLOOD  Result Value Ref Range Status   Specimen Description BLOOD BLOOD RIGHT ARM  Final   Special  Requests   Final    BOTTLES DRAWN AEROBIC AND ANAEROBIC Blood Culture adequate volume   Culture   Final    NO GROWTH 4 DAYS Performed at [facility], [address] [place], [place] [address]    Report Status PENDING  Incomplete  Resp panel by RT-PCR (RSV, Flu A&B, Covid) Anterior Nasal Swab     Status: None   Collection Time: 09/22/23  1:23 AM   Specimen: Anterior Nasal Swab  Result Value Ref Range Status   SARS Coronavirus 2 by RT PCR NEGATIVE NEGATIVE Final   Influenza A by PCR NEGATIVE NEGATIVE Final   Influenza B by PCR NEGATIVE NEGATIVE Final    Comment: (NOTE) The Xpert Xpress SARS-CoV-2/FLU/RSV plus assay is intended as an aid in the diagnosis of influenza from Nasopharyngeal swab specimens and should not be used as a sole basis for treatment. Nasal washings and aspirates are unacceptable for Xpert Xpress SARS-CoV-2/FLU/RSV testing.  Fact Sheet for Patients: BloggerCourse.com  Fact Sheet for Healthcare Providers: SeriousBroker.it  This test is not yet approved or cleared by the [place] FDA and has been authorized for detection and/or diagnosis of SARS-CoV-2 by FDA under an Emergency Use Authorization (EUA). This EUA will remain in effect (meaning this test can be used) for the duration of the COVID-19 declaration under Section 564(b)(1) of the Act, 21 U.S.C. section 360bbb-3(b)(1), unless the authorization is terminated or revoked.     Resp Syncytial Virus by PCR NEGATIVE NEGATIVE Final    Comment: (NOTE) Fact Sheet for Patients: BloggerCourse.com  Fact Sheet for Healthcare Providers: SeriousBroker.it  This test is not yet approved or cleared by the [place] FDA and has been authorized for detection and/or diagnosis of SARS-CoV-2 by FDA under an Emergency Use Authorization (EUA). This EUA will remain in effect (meaning this test can be used) for  the duration of the COVID-19 declaration under Section 564(b)(1) of the Act, 21 U.S.C. section 360bbb-3(b)(1), unless the authorization is terminated or revoked.  Performed at [facility], [address] [place], [place] [address]   Blood culture (routine x 2)     Status: None (Preliminary result)   Collection Time: 09/22/23  1:28 AM   Specimen: BLOOD  Result Value Ref Range Status   Specimen Description BLOOD BLOOD RIGHT ARM  Final   Special Requests   Final    BOTTLES DRAWN AEROBIC AND ANAEROBIC Blood Culture results may not be optimal due to an inadequate volume of blood received in culture bottles   Culture   Final    NO GROWTH 4 DAYS Performed at [facility], [address] [place], [place] [address]    Report Status PENDING  Incomplete  Gastrointestinal Panel by PCR , Stool  Status: Abnormal   Collection Time: 09/23/23 12:43 PM   Specimen: STOOL  Result Value Ref Range Status   Campylobacter species NOT DETECTED NOT DETECTED Final   Plesimonas shigelloides NOT DETECTED NOT DETECTED Final   Salmonella species NOT DETECTED NOT DETECTED Final   Yersinia enterocolitica NOT DETECTED NOT DETECTED Final   Vibrio species NOT DETECTED NOT DETECTED Final   Vibrio cholerae NOT DETECTED NOT DETECTED Final   Enteroaggregative E coli (EAEC) NOT DETECTED NOT DETECTED Final   Enteropathogenic E coli (EPEC) NOT DETECTED NOT DETECTED Final   Enterotoxigenic E coli (ETEC) NOT DETECTED NOT DETECTED Final   Shiga like toxin producing E coli (STEC) NOT DETECTED NOT DETECTED Final   Shigella/Enteroinvasive E coli (EIEC) NOT DETECTED NOT DETECTED Final   Cryptosporidium NOT DETECTED NOT DETECTED Final   Cyclospora cayetanensis NOT DETECTED NOT DETECTED Final   Entamoeba histolytica NOT DETECTED NOT DETECTED Final   Giardia lamblia NOT DETECTED NOT DETECTED Final   Adenovirus F40/41 NOT DETECTED NOT DETECTED Final   Astrovirus NOT DETECTED NOT DETECTED Final   Norovirus  GI/GII DETECTED (A) NOT DETECTED Final    Comment: RESULT CALLED TO, READ BACK BY AND VERIFIED WITH: SCOTT GENTRY 09/24/23 0952 MW    Rotavirus A NOT DETECTED NOT DETECTED Final   Sapovirus (I, II, IV, and V) NOT DETECTED NOT DETECTED Final    Comment: Performed at [facility], [address] [place], [place] [address]  C Difficile Quick Screen w PCR reflex     Status: Abnormal   Collection Time: 09/23/23 12:43 PM   Specimen: STOOL  Result Value Ref Range Status   C Diff antigen POSITIVE (A) NEGATIVE Final   C Diff toxin POSITIVE (A) NEGATIVE Final   C Diff interpretation Toxin producing C. difficile detected.  Final    Comment: CRITICAL RESULT CALLED TO, READ BACK BY AND VERIFIED WITH: RN SCOTT GENTRY ON 09/23/23 @ 1657 BY DRT Performed at [facility], [address], [place], [place] [address]     Please note: You were cared for by a hospitalist during your hospital stay. Once you are discharged, your primary care physician will handle any further medical issues. Please note that NO REFILLS for any discharge medications will be authorized once you are discharged, as it is imperative that you return to your primary care physician (or establish a relationship with a primary care physician if you do not have one) for your post hospital discharge needs so that they can reassess your need for medications and monitor your lab values.    Time coordinating discharge: 40 minutes  SIGNED:   Burnadette Pop, MD  [facility] 09/26/2023, 1:52 PM Pager [phone]  If 7PM-7AM, please contact night-coverage [email or web address] Password [email or web address]

## 2023-09-26 NOTE — Care Management Obs Status (Signed)
MEDICARE OBSERVATION STATUS NOTIFICATION   Patient Details  Name: LARS HARTKOPF MRN: [id number] Date of Birth: [birth year]  Called patient's wife Iaan Krabill [phone] and verbally explained form .  Wife voiced understanding. Hard copy left at bedside.  Medicare Observation Status Notification Given:  Yes    Kingsley Plan, RN 09/26/2023, 4:10 PM

## 2023-09-26 NOTE — TOC Progression Note (Signed)
Transition of Care [facility]) - Progression Note    Patient Details  Name: [name] MRN: [id number] Date of Birth: [birth year]  Transition of Care [facility]) CM/SW Contact  Carley Hammed, LCSW Phone Number: 09/26/2023, 3:53 PM  Clinical Narrative:    CSW received a call from Facility and DON who noted they are unable to accept pt at this time due to ongoing diarrhea and concerns about HD. Facility states pt was having loose stools when they attempted to DC yesterday, which is why the facility sent him back. Per facility pt will need to be quarantined for 14 days with at least 3 days no loose stools prior to DC.  CSW advised the medical team to this, it was reiterated that pt has not had loose stools and is medically stable. Renal Navigator also had discussed this with HD clinic and they reported it was not a concern. CSW Requested supervisor review case, this was escalated to the [facility] director.   Renal navigator noting she has discussed pt with HD clinic and they are prepared to take pt back for HD at DC with no concerns. CSW discussed update with spouse and questions were answered. Spouse noted concern as well for pt being discharged back so soon. TOC will continue to follow for DC needs.      Barriers to Discharge: Barriers Resolved  Expected Discharge Plan and Services         Expected Discharge Date: 09/26/23                                     Social Determinants of Health (SDOH) Interventions SDOH Screenings   Food Insecurity: No Food Insecurity (09/26/2023)  Housing: Low Risk  (09/26/2023)  Transportation Needs: No Transportation Needs (09/26/2023)  Utilities: Not At Risk (09/26/2023)  Social Connections: Moderately Isolated (09/26/2023)  Tobacco Use: High Risk (09/26/2023)    Readmission Risk Interventions    05/29/2023    4:41 PM 06/30/2021   11:53 AM  Readmission Risk Prevention Plan  Transportation Screening Complete Complete  Medication Review Oceanographer)  Complete Complete  PCP or Specialist appointment within 3-5 days of discharge Complete   HRI or Home Care Consult Complete Complete  SW Recovery Care/Counseling Consult Complete Complete  Palliative Care Screening Not Applicable Not Applicable  Skilled Nursing Facility Complete Not Applicable

## 2023-09-26 NOTE — Progress Notes (Addendum)
1:15pm: CSW made additional attempts to reach [facility] and admissions at [facility] without success.  CSW spoke with unit CSW Jess to inform her of information.  12pm: CSW made several attempts to reach [place] at [facility] without success to determine if facility could provide patient with transportation to HD on Friday and Saturday this week.  8:40am: CSW spoke with [place] admissions at [facility] to discuss patient. Colin Mulders states she is did not speak with the HD clinic herself but was told by the person who did that the clinic could not treat patient as he has C Diff. CSW informed Colin Mulders that French Ana, renal navigator spoke with staff at the HD clinic who states they did not speak with anyone at [facility] and that they can treat patient. Colin Mulders states she has to speak with a supervisor regarding this and will return call to CSW.  Edwin Dada, MSW, LCSW Transitions of Care  Clinical Social Worker II [phone]

## 2023-09-26 NOTE — Progress Notes (Addendum)
Contacted by hospitalist and nephrologist of pt's re-admission due to snf stating that out-pt HD clinic cannot treat pt with cdiff. Contacted [facility] this morning and spoke to Consulting civil engineer. Pt's chart reviewed and there is no notation at clinic of clinic staff speaking with snf staff to say pt cannot receive out-pt HD without 2 week isolation period. Charge RN states that clinic can treat cdiff pts and that clinic has a protocol in place. Clinic can treat cdiff pts as long as clinic is aware of diagnosis and can take proper precautions. This info was provided to hospitalist and nephrologist this am. Case also discussed with ED CSW who plans to reach out to snf. Pt receives out-pt HD at [facility] on TTS. Pt arrives at 11:00 for 11:15 chair time. Will assist as needed.   Olivia Canter Renal Navigator [phone]  Addendum at 5:07 pm: Spoke to charge RN at [facility]. Clinic advised that pt remains hospitalized and navigator will provide update regarding pt's return to snf once known/confirmed. Charge RN confirms that clinic is prepared to accept pt back when pt returns to snf. Will assist as needed.

## 2023-09-26 NOTE — TOC Transition Note (Signed)
Transition of Care [facility]) - Discharge Note   Patient Details  Name: [name] MRN: [id number] Date of Birth: [birth year]  Transition of Care [facility]) CM/SW Contact:  Carley Hammed, LCSW Phone Number: 09/26/2023, 1:52 PM   Clinical Narrative:     Pt to be transported to [facility] via [place]. Nurse to call report to [phone].  Final next level of care: Skilled Nursing Facility Barriers to Discharge: Barriers Resolved   Patient Goals and CMS Choice            Discharge Placement                       Discharge Plan and Services Additional resources added to the After Visit Summary for                                       Social Drivers of Health (SDOH) Interventions SDOH Screenings   Food Insecurity: No Food Insecurity (09/26/2023)  Housing: Low Risk  (09/26/2023)  Transportation Needs: No Transportation Needs (09/26/2023)  Utilities: Not At Risk (09/26/2023)  Social Connections: Moderately Isolated (09/26/2023)  Tobacco Use: High Risk (09/26/2023)     Readmission Risk Interventions    05/29/2023    4:41 PM 06/30/2021   11:53 AM  Readmission Risk Prevention Plan  Transportation Screening Complete Complete  Medication Review Oceanographer) Complete Complete  PCP or Specialist appointment within 3-5 days of discharge Complete   HRI or Home Care Consult Complete Complete  SW Recovery Care/Counseling Consult Complete Complete  Palliative Care Screening Not Applicable Not Applicable  Skilled Nursing Facility Complete Not Applicable

## 2023-09-26 NOTE — Social Work (Signed)
CSW spoke with pt's spouse to advise of impending discharge. Spouse agitated and verbally aggressive, complaining about hospital stay. CSW advised that pt had been on the unit for less than an hour and was attempting to assist her but not privy to prior hospitalization incidents. Spouse began asking facility specific questions about quarantine and CDC guidelines, CSW deferred to facility but spouse became upset again stating CSW should know this. Spouse asking for a printed copy of medication list and DC summary. CSW advised that these could be requested through medical records or viewed through MyChart. Spouse stating pt has a right to these things, CSW advised he does have a right but it needs to be requested through the right channels.  Spouse became hostile while discussing billing and CSW had to respectfully end the conversation as it was not helpful or respectful. Spouse states "that is because you cannot do your job".  Pt to discharge back to facility.

## 2023-09-27 DIAGNOSIS — Z7401 Bed confinement status: Secondary | ICD-10-CM | POA: Diagnosis not present

## 2023-09-27 DIAGNOSIS — I959 Hypotension, unspecified: Secondary | ICD-10-CM | POA: Diagnosis not present

## 2023-09-27 DIAGNOSIS — Z743 Need for continuous supervision: Secondary | ICD-10-CM | POA: Diagnosis not present

## 2023-09-27 LAB — CULTURE, BLOOD (ROUTINE X 2)
Culture: NO GROWTH
Culture: NO GROWTH
Special Requests: ADEQUATE

## 2023-09-27 LAB — GLUCOSE, CAPILLARY
Glucose-Capillary: 143 mg/dL — ABNORMAL HIGH (ref 70–99)
Glucose-Capillary: 159 mg/dL — ABNORMAL HIGH (ref 70–99)
Glucose-Capillary: 163 mg/dL — ABNORMAL HIGH (ref 70–99)
Glucose-Capillary: 210 mg/dL — ABNORMAL HIGH (ref 70–99)

## 2023-09-27 LAB — HEPATITIS B SURFACE ANTIGEN: Hepatitis B Surface Ag: NONREACTIVE

## 2023-09-27 MED ORDER — MUPIROCIN 2 % EX OINT
1.0000 | TOPICAL_OINTMENT | Freq: Two times a day (BID) | CUTANEOUS | Status: DC
  Administered 2023-09-27: 1 via NASAL
  Filled 2023-09-27: qty 22

## 2023-09-27 MED ORDER — SEVELAMER CARBONATE 800 MG PO TABS
800.0000 mg | ORAL_TABLET | Freq: Three times a day (TID) | ORAL | Status: DC
  Administered 2023-09-27 (×2): 800 mg via ORAL
  Filled 2023-09-27 (×2): qty 1

## 2023-09-27 MED ORDER — PROSOURCE PLUS PO LIQD: 30.0000 mL | Freq: Two times a day (BID) | ORAL | Status: DC

## 2023-09-27 NOTE — TOC Progression Note (Addendum)
Transition of Care [facility]) - Progression Note    Patient Details  Name: [name] MRN: [id number] Date of Birth: 01/27/1956  Transition of Care [facility]) CM/SW Contact  Carley Hammed, LCSW Phone Number: 09/27/2023, 2:44 PM  Clinical Narrative:    [facility] supervisor worked with [employer] and CSW and Supervisor were advised that pt can DC back with no barriers. CSW had spoken with spouse earlier and she was being told they were terminating care for pt as he would be gone for 14 days.  Administrator confirmed that they cannot do that for LTC patients and pt is welcome to return at DC, but it may be a different bed. CSW advised spouse of this and that administration stated pt was welcome to come back, MD ready to DC. CSW spoke with Toniann Fail at [facility] who stated that the situation had not been sorted out, and they were still declining to accept pt due to the HD issue. The comment was that the HD center did not get the whole story about pt's illness. Pt no longer with any abdominal pain or diarrhea. TOC supervisor to follow up with Administration again, to figure out the barriers. Per [place] Adas their medical director is saying no to pt coming back. CSW advised by administrator that she would follow up with CSW when this is sorted. Per renal navigator, HD facility is still agreeable to take pt tomorrow. TOC will continue to follow.   4:35 CSW spoke with Toniann Fail at [facility] who confirmed they are able to accept pt back. CSW advised medical team and spouse.   Barriers to Discharge: Barriers Resolved  Expected Discharge Plan and Services         Expected Discharge Date: 09/26/23                                     Social Determinants of Health (SDOH) Interventions SDOH Screenings   Food Insecurity: No Food Insecurity (09/26/2023)  Housing: Low Risk  (09/26/2023)  Transportation Needs: No Transportation Needs (09/26/2023)  Utilities: Not At Risk (09/26/2023)  Social Connections:  Moderately Isolated (09/26/2023)  Tobacco Use: High Risk (09/26/2023)    Readmission Risk Interventions    05/29/2023    4:41 PM 06/30/2021   11:53 AM  Readmission Risk Prevention Plan  Transportation Screening Complete Complete  Medication Review Oceanographer) Complete Complete  PCP or Specialist appointment within 3-5 days of discharge Complete   HRI or Home Care Consult Complete Complete  SW Recovery Care/Counseling Consult Complete Complete  Palliative Care Screening Not Applicable Not Applicable  Skilled Nursing Facility Complete Not Applicable

## 2023-09-27 NOTE — Progress Notes (Signed)
Contacted [facility] earlier this afternoon to advise staff pt is for possible d/c back to snf this afternoon if snf situation can be resolved. Clinic staff advised navigator that pt's chair is still available for tomorrow and pt can be treated. Inquired if any additional information was needed or required for pt at this time. Navigator advised no additional information needed. Clinic staff are aware that pt has cdiff and will take proper precautions per protocols. Clinic staff advised that pt has not had any additional diarrhea or abdominal pain per attending. Renal PA has sent orders to clinic for tomorrow's treatment. Once pt's d/c was confirmed by CSW, navigator contacted clinic once again to confirm pt's d/c for today and that pt should resume care tomorrow as discussed with previous staff member.   Olivia Canter Renal Navigator [phone]

## 2023-09-27 NOTE — Discharge Planning (Signed)
[facility] Patient Discharge Orders - [facility] CLINIC: [facility]  Patient's name: [name] Admit/DC Dates: 09/25/2023 - 09/27/23  DISCHARGE DIAGNOSES: C.Diff + Norovirus - diarrhea resolved at this time, finishing course of PO Vanc  ESRD   HD ORDER CHANGES: Heparin change: no EDW Change: YES New EDW: 103kg Bath Change: no  ANEMIA MANAGEMENT: Aranesp: Given: no   ESA dose for discharge: Per protocol IV Iron dose at discharge: Per protocol Transfusion: Given: no  BONE/MINERAL MEDICATIONS: Hectorol/Calcitriol change: no Sensipar/Parsabiv change: no  ACCESS INTERVENTION/CHANGE: no Details:  RECENT LABS: Recent Labs  Lab 09/22/23 0123 09/22/23 0524 09/26/23 0320  HGB 11.7*   < > 10.0*  NA 139   < > 130*  K 4.2   < > 4.4  CALCIUM 8.1*   < > 7.9*  ALBUMIN 2.9*  --   --    < > = values in this interval not displayed.    IV ANTIBIOTICS: no Details: Will be finishing course of PO Vanc  OTHER ANTICOAGULATION: On Coumadin?: no  OTHER/APPTS/LAB ORDERS: - Going back to SNF  D/C Meds to be reconciled by nurse after every discharge.  Completed By: Ozzie Hoyle, PA-C [facility] Pager [phone]   Reviewed by: MD:______ RN_______

## 2023-09-27 NOTE — Progress Notes (Signed)
No charge progress note.  Patient is a 68 year old male with history of ESRD on dialysis, hypertension, insulin-dependent diabetes type 2, COPD, chronic pain syndrome, peripheral artery disease status post bilateral BKA, coronary artery disease, paroxysmal A-fib on Eliquis who presented with lethargy, low-grade fever, abdominal discomfort from skilled nursing facility.  Patient was tested positive for C. difficile colitis and norovirus.  Started on p.o. vancomycin to finish a 10-day course.  Diarrhea resolved and no more abdominal pain.  Patient was discharged yesterday but apparently his facility was refusing to take him back stating that he need to be quarantined for 14 days which is completely nonsense.  TOC leadership got involved and patient likely will go back to his facility today.  Patient with no more diarrhea or abdominal pain.  Eating and drinking fine.  His dialysis facility has no concern.  -Will continue current management and he will go back to his facility likely today.

## 2023-09-27 NOTE — TOC Transition Note (Signed)
Transition of Care [facility]) - Discharge Note   Patient Details  Name: [name] MRN: [id number] Date of Birth: 1955-10-05  Transition of Care [facility]) CM/SW Contact:  Carley Hammed, LCSW Phone Number: 09/27/2023, 4:32 PM   Clinical Narrative:    Pt to be transported to [facility] via [place]. Nurse to call report to [phone].   Final next level of care: Skilled Nursing Facility Barriers to Discharge: Barriers Resolved   Patient Goals and CMS Choice            Discharge Placement              Patient chooses bed at: [facility] Patient to be transferred to facility by: [facility] Name of family member notified: Kaelum Mcanulty Patient and family notified of of transfer: 09/27/23  Discharge Plan and Services Additional resources added to the After Visit Summary for                                       Social Drivers of Health (SDOH) Interventions SDOH Screenings   Food Insecurity: No Food Insecurity (09/26/2023)  Housing: Low Risk  (09/26/2023)  Transportation Needs: No Transportation Needs (09/26/2023)  Utilities: Not At Risk (09/26/2023)  Social Connections: Moderately Isolated (09/26/2023)  Tobacco Use: High Risk (09/26/2023)     Readmission Risk Interventions    05/29/2023    4:41 PM 06/30/2021   11:53 AM  Readmission Risk Prevention Plan  Transportation Screening Complete Complete  Medication Review Oceanographer) Complete Complete  PCP or Specialist appointment within 3-5 days of discharge Complete   HRI or Home Care Consult Complete Complete  SW Recovery Care/Counseling Consult Complete Complete  Palliative Care Screening Not Applicable Not Applicable  Skilled Nursing Facility Complete Not Applicable

## 2023-09-27 NOTE — Progress Notes (Addendum)
  [facility] Progress Note   Subjective:   Seen in room - no CP/dyspnea. No further diarrhea. Awaiting SNF to accept him back. Next HD for tomorrow.  Objective Vitals:   09/26/23 2151 09/27/23 0413 09/27/23 0542 09/27/23 0831  BP: (!) 135/51 (!) 129/55 (!) 129/55 (!) 139/56  Pulse: 61 (!) 57  60  Resp:  17  15  Temp:  98.5 F (36.9 C)  98.9 F (37.2 C)  TempSrc:  Oral  Oral  SpO2:  97%  97%  Weight:      Height:       Physical Exam General: Well appearing man, NAD. Room air Heart: RRR; no murmur Lungs: CTA anteriorly Abdomen: soft, non-tender Extremities: no LE edema Dialysis Access:  LUE AVF +t/b  Additional Objective Labs: Basic Metabolic Panel: Recent Labs  Lab 09/25/23 0626 09/25/23 1717 09/26/23 0320  NA 133* 132* 130*  K 4.0 4.4 4.4  CL 95* 96* 94*  CO2 22 27 25   GLUCOSE 123* 170* 143*  BUN 70* 37* 44*  CREATININE 7.74* 5.14* 5.81*  CALCIUM 8.0* 8.3* 7.9*   Liver Function Tests: Recent Labs  Lab 09/22/23 0123  AST 19  ALT 17  ALKPHOS 96  BILITOT 0.5  PROT 6.1*  ALBUMIN 2.9*   CBC: Recent Labs  Lab 09/22/23 0123 09/22/23 0524 09/23/23 0501 09/25/23 0626 09/25/23 1717 09/26/23 0320  WBC 9.3 7.6 6.4 6.8 7.0 6.7  NEUTROABS 8.3*  --   --   --  5.2  --   HGB 11.7* 10.9* 9.8* 10.2* 11.1* 10.0*  HCT 35.4* 33.4* 29.7* 29.7* 32.7* 28.6*  MCV 93.9 95.4 94.3 90.0 90.6 91.1  PLT 155 149* 134* 136* 159 192   Medications:   amiodarone  200 mg Oral Daily   amLODipine  10 mg Oral Daily   apixaban  5 mg Oral BID   atorvastatin  40 mg Oral QHS   famotidine  20 mg Oral QODAY   [START ON 09/28/2023] fentaNYL  1 patch Transdermal Q72H   gabapentin  100 mg Oral TID   hydrALAZINE  50 mg Oral Q8H   insulin aspart  0-5 Units Subcutaneous QHS   insulin aspart  0-9 Units Subcutaneous TID WC   isosorbide mononitrate  60 mg Oral Daily   labetalol  200 mg Oral BID   mupirocin ointment  1 Application Nasal BID   rOPINIRole  2 mg Oral QHS    tamsulosin  0.4 mg Oral Daily   vancomycin  125 mg Oral Q6H    Dialysis Orders TTS - [facility] 4hr, 450/800, EDW 106.8kg, 2K/2Ca bath, AVF, no heparin - no recent ESA  Assessment/Plan: C.diff + Norovirus: Improved, diarrhea resolved. Finishing course PO Vanc ESRD: Continue HD on usual MWF schedule - next HD tomorrow. HTN/volume: BP stable, below prior EDW - lower on discharge. Continue home meds. Anemia of ESRD: Hgb 10 - no ESA for now. Secondary HPTH: CorrCa ok, Phos pending - has not been getting binders this admit, resume home meds (sevelamer) Nutrition: Alb low, adding supps CAD/PAD/Hx BKA Dispo: Ready for discharge, being held up by SNF accepting him back  Ozzie Hoyle, PA-C 09/27/2023, 9:44 AM  [employer]

## 2023-09-28 LAB — HEPATITIS B SURFACE ANTIBODY, QUANTITATIVE: Hep B S AB Quant (Post): 9.5 m[IU]/mL — ABNORMAL LOW

## 2023-10-02 DIAGNOSIS — Z992 Dependence on renal dialysis: Secondary | ICD-10-CM | POA: Diagnosis not present

## 2023-10-02 DIAGNOSIS — N186 End stage renal disease: Secondary | ICD-10-CM | POA: Diagnosis not present

## 2023-10-02 DIAGNOSIS — I10 Essential (primary) hypertension: Secondary | ICD-10-CM | POA: Diagnosis not present

## 2023-10-02 DIAGNOSIS — E119 Type 2 diabetes mellitus without complications: Secondary | ICD-10-CM | POA: Diagnosis not present

## 2023-10-04 DIAGNOSIS — G8929 Other chronic pain: Secondary | ICD-10-CM | POA: Diagnosis not present

## 2023-10-04 DIAGNOSIS — M1990 Unspecified osteoarthritis, unspecified site: Secondary | ICD-10-CM | POA: Diagnosis not present

## 2023-10-11 DIAGNOSIS — Z992 Dependence on renal dialysis: Secondary | ICD-10-CM | POA: Diagnosis not present

## 2023-10-11 DIAGNOSIS — E1122 Type 2 diabetes mellitus with diabetic chronic kidney disease: Secondary | ICD-10-CM | POA: Diagnosis not present

## 2023-10-11 DIAGNOSIS — N186 End stage renal disease: Secondary | ICD-10-CM | POA: Diagnosis not present

## 2023-10-14 DIAGNOSIS — E113512 Type 2 diabetes mellitus with proliferative diabetic retinopathy with macular edema, left eye: Secondary | ICD-10-CM | POA: Diagnosis not present

## 2023-10-14 DIAGNOSIS — E113511 Type 2 diabetes mellitus with proliferative diabetic retinopathy with macular edema, right eye: Secondary | ICD-10-CM | POA: Diagnosis not present

## 2023-10-14 DIAGNOSIS — Z794 Long term (current) use of insulin: Secondary | ICD-10-CM | POA: Diagnosis not present

## 2023-10-15 DIAGNOSIS — N186 End stage renal disease: Secondary | ICD-10-CM | POA: Diagnosis not present

## 2023-10-15 DIAGNOSIS — G8929 Other chronic pain: Secondary | ICD-10-CM | POA: Diagnosis not present

## 2023-10-15 DIAGNOSIS — Z91158 Patient's noncompliance with renal dialysis for other reason: Secondary | ICD-10-CM | POA: Diagnosis not present

## 2023-10-15 DIAGNOSIS — M1990 Unspecified osteoarthritis, unspecified site: Secondary | ICD-10-CM | POA: Diagnosis not present

## 2023-10-28 DIAGNOSIS — E113511 Type 2 diabetes mellitus with proliferative diabetic retinopathy with macular edema, right eye: Secondary | ICD-10-CM | POA: Diagnosis not present

## 2023-10-28 DIAGNOSIS — E1165 Type 2 diabetes mellitus with hyperglycemia: Secondary | ICD-10-CM | POA: Diagnosis not present

## 2023-10-28 DIAGNOSIS — Z91148 Patient's other noncompliance with medication regimen for other reason: Secondary | ICD-10-CM | POA: Diagnosis not present

## 2023-10-28 DIAGNOSIS — N186 End stage renal disease: Secondary | ICD-10-CM | POA: Diagnosis not present

## 2023-10-28 DIAGNOSIS — Z794 Long term (current) use of insulin: Secondary | ICD-10-CM | POA: Diagnosis not present

## 2023-10-28 DIAGNOSIS — E113512 Type 2 diabetes mellitus with proliferative diabetic retinopathy with macular edema, left eye: Secondary | ICD-10-CM | POA: Diagnosis not present

## 2023-10-28 DIAGNOSIS — Z992 Dependence on renal dialysis: Secondary | ICD-10-CM | POA: Diagnosis not present

## 2023-10-28 DIAGNOSIS — Z961 Presence of intraocular lens: Secondary | ICD-10-CM | POA: Diagnosis not present

## 2023-10-29 DIAGNOSIS — M1990 Unspecified osteoarthritis, unspecified site: Secondary | ICD-10-CM | POA: Diagnosis not present

## 2023-10-29 DIAGNOSIS — G8929 Other chronic pain: Secondary | ICD-10-CM | POA: Diagnosis not present

## 2023-11-04 DIAGNOSIS — E119 Type 2 diabetes mellitus without complications: Secondary | ICD-10-CM | POA: Diagnosis not present

## 2023-11-04 DIAGNOSIS — E113512 Type 2 diabetes mellitus with proliferative diabetic retinopathy with macular edema, left eye: Secondary | ICD-10-CM | POA: Diagnosis not present

## 2023-11-04 DIAGNOSIS — I10 Essential (primary) hypertension: Secondary | ICD-10-CM | POA: Diagnosis not present

## 2023-11-05 DIAGNOSIS — E1165 Type 2 diabetes mellitus with hyperglycemia: Secondary | ICD-10-CM | POA: Diagnosis not present

## 2023-11-08 DIAGNOSIS — E1122 Type 2 diabetes mellitus with diabetic chronic kidney disease: Secondary | ICD-10-CM | POA: Diagnosis not present

## 2023-11-08 DIAGNOSIS — N186 End stage renal disease: Secondary | ICD-10-CM | POA: Diagnosis not present

## 2023-11-08 DIAGNOSIS — Z992 Dependence on renal dialysis: Secondary | ICD-10-CM | POA: Diagnosis not present

## 2023-11-11 DIAGNOSIS — E113512 Type 2 diabetes mellitus with proliferative diabetic retinopathy with macular edema, left eye: Secondary | ICD-10-CM | POA: Diagnosis not present

## 2023-11-15 DIAGNOSIS — I10 Essential (primary) hypertension: Secondary | ICD-10-CM | POA: Diagnosis not present

## 2023-11-15 DIAGNOSIS — G8929 Other chronic pain: Secondary | ICD-10-CM | POA: Diagnosis not present

## 2023-11-15 DIAGNOSIS — M1990 Unspecified osteoarthritis, unspecified site: Secondary | ICD-10-CM | POA: Diagnosis not present

## 2023-11-19 ENCOUNTER — Emergency Department (HOSPITAL_COMMUNITY)

## 2023-11-19 ENCOUNTER — Other Ambulatory Visit: Payer: Self-pay

## 2023-11-19 ENCOUNTER — Observation Stay (HOSPITAL_COMMUNITY)
Admission: EM | Admit: 2023-11-19 | Discharge: 2023-11-20 | Disposition: A | Discharge status: Skilled Nursing Facility | Attending: Internal Medicine | Admitting: Internal Medicine

## 2023-11-19 ENCOUNTER — Encounter (HOSPITAL_COMMUNITY): Payer: Self-pay | Admitting: Emergency Medicine

## 2023-11-19 DIAGNOSIS — Z89421 Acquired absence of other right toe(s): Secondary | ICD-10-CM | POA: Diagnosis not present

## 2023-11-19 DIAGNOSIS — R5381 Other malaise: Secondary | ICD-10-CM | POA: Diagnosis present

## 2023-11-19 DIAGNOSIS — F17210 Nicotine dependence, cigarettes, uncomplicated: Secondary | ICD-10-CM | POA: Insufficient documentation

## 2023-11-19 DIAGNOSIS — Z932 Ileostomy status: Secondary | ICD-10-CM | POA: Diagnosis not present

## 2023-11-19 DIAGNOSIS — R21 Rash and other nonspecific skin eruption: Secondary | ICD-10-CM | POA: Insufficient documentation

## 2023-11-19 DIAGNOSIS — Z96651 Presence of right artificial knee joint: Secondary | ICD-10-CM | POA: Insufficient documentation

## 2023-11-19 DIAGNOSIS — N186 End stage renal disease: Secondary | ICD-10-CM

## 2023-11-19 DIAGNOSIS — L89309 Pressure ulcer of unspecified buttock, unspecified stage: Secondary | ICD-10-CM | POA: Diagnosis present

## 2023-11-19 DIAGNOSIS — Z992 Dependence on renal dialysis: Secondary | ICD-10-CM | POA: Diagnosis not present

## 2023-11-19 DIAGNOSIS — Z794 Long term (current) use of insulin: Secondary | ICD-10-CM | POA: Diagnosis not present

## 2023-11-19 DIAGNOSIS — I132 Hypertensive heart and chronic kidney disease with heart failure and with stage 5 chronic kidney disease, or end stage renal disease: Secondary | ICD-10-CM | POA: Diagnosis not present

## 2023-11-19 DIAGNOSIS — I480 Paroxysmal atrial fibrillation: Secondary | ICD-10-CM | POA: Diagnosis present

## 2023-11-19 DIAGNOSIS — I5032 Chronic diastolic (congestive) heart failure: Secondary | ICD-10-CM | POA: Diagnosis not present

## 2023-11-19 DIAGNOSIS — R079 Chest pain, unspecified: Secondary | ICD-10-CM | POA: Insufficient documentation

## 2023-11-19 DIAGNOSIS — E875 Hyperkalemia: Secondary | ICD-10-CM | POA: Diagnosis not present

## 2023-11-19 DIAGNOSIS — J449 Chronic obstructive pulmonary disease, unspecified: Secondary | ICD-10-CM | POA: Insufficient documentation

## 2023-11-19 DIAGNOSIS — Z89422 Acquired absence of other left toe(s): Secondary | ICD-10-CM | POA: Diagnosis not present

## 2023-11-19 DIAGNOSIS — E8779 Other fluid overload: Secondary | ICD-10-CM

## 2023-11-19 DIAGNOSIS — Z89511 Acquired absence of right leg below knee: Secondary | ICD-10-CM | POA: Diagnosis not present

## 2023-11-19 DIAGNOSIS — E8770 Fluid overload, unspecified: Secondary | ICD-10-CM

## 2023-11-19 DIAGNOSIS — I10 Essential (primary) hypertension: Secondary | ICD-10-CM | POA: Diagnosis present

## 2023-11-19 DIAGNOSIS — I5030 Unspecified diastolic (congestive) heart failure: Secondary | ICD-10-CM | POA: Diagnosis present

## 2023-11-19 DIAGNOSIS — R001 Bradycardia, unspecified: Principal | ICD-10-CM | POA: Diagnosis present

## 2023-11-19 DIAGNOSIS — Z89512 Acquired absence of left leg below knee: Secondary | ICD-10-CM | POA: Diagnosis not present

## 2023-11-19 DIAGNOSIS — R5383 Other fatigue: Secondary | ICD-10-CM | POA: Diagnosis present

## 2023-11-19 DIAGNOSIS — Z955 Presence of coronary angioplasty implant and graft: Secondary | ICD-10-CM | POA: Insufficient documentation

## 2023-11-19 DIAGNOSIS — Z7901 Long term (current) use of anticoagulants: Secondary | ICD-10-CM | POA: Diagnosis not present

## 2023-11-19 DIAGNOSIS — J984 Other disorders of lung: Secondary | ICD-10-CM | POA: Diagnosis not present

## 2023-11-19 DIAGNOSIS — R0902 Hypoxemia: Secondary | ICD-10-CM | POA: Diagnosis not present

## 2023-11-19 DIAGNOSIS — Z91040 Latex allergy status: Secondary | ICD-10-CM | POA: Diagnosis not present

## 2023-11-19 DIAGNOSIS — I959 Hypotension, unspecified: Secondary | ICD-10-CM | POA: Diagnosis not present

## 2023-11-19 DIAGNOSIS — I2510 Atherosclerotic heart disease of native coronary artery without angina pectoris: Secondary | ICD-10-CM | POA: Diagnosis not present

## 2023-11-19 DIAGNOSIS — E1122 Type 2 diabetes mellitus with diabetic chronic kidney disease: Secondary | ICD-10-CM | POA: Insufficient documentation

## 2023-11-19 DIAGNOSIS — E119 Type 2 diabetes mellitus without complications: Secondary | ICD-10-CM

## 2023-11-19 DIAGNOSIS — D649 Anemia, unspecified: Secondary | ICD-10-CM | POA: Insufficient documentation

## 2023-11-19 DIAGNOSIS — R000 Tachycardia, unspecified: Secondary | ICD-10-CM | POA: Diagnosis not present

## 2023-11-19 DIAGNOSIS — R0602 Shortness of breath: Secondary | ICD-10-CM | POA: Diagnosis not present

## 2023-11-19 DIAGNOSIS — R918 Other nonspecific abnormal finding of lung field: Secondary | ICD-10-CM | POA: Diagnosis not present

## 2023-11-19 LAB — CBC WITH DIFFERENTIAL/PLATELET
Abs Immature Granulocytes: 0.01 10*3/uL (ref 0.00–0.07)
Basophils Absolute: 0.1 10*3/uL (ref 0.0–0.1)
Basophils Relative: 1 %
Eosinophils Absolute: 0.3 10*3/uL (ref 0.0–0.5)
Eosinophils Relative: 6 %
HCT: 30.1 % — ABNORMAL LOW (ref 39.0–52.0)
Hemoglobin: 9.9 g/dL — ABNORMAL LOW (ref 13.0–17.0)
Immature Granulocytes: 0 %
Lymphocytes Relative: 15 %
Lymphs Abs: 0.8 10*3/uL (ref 0.7–4.0)
MCH: 31.7 pg (ref 26.0–34.0)
MCHC: 32.9 g/dL (ref 30.0–36.0)
MCV: 96.5 fL (ref 80.0–100.0)
Monocytes Absolute: 0.6 10*3/uL (ref 0.1–1.0)
Monocytes Relative: 12 %
Neutro Abs: 3.3 10*3/uL (ref 1.7–7.7)
Neutrophils Relative %: 66 %
Platelets: 146 10*3/uL — ABNORMAL LOW (ref 150–400)
RBC: 3.12 MIL/uL — ABNORMAL LOW (ref 4.22–5.81)
RDW: 14.3 % (ref 11.5–15.5)
WBC: 5 10*3/uL (ref 4.0–10.5)
nRBC: 0 % (ref 0.0–0.2)

## 2023-11-19 LAB — COMPREHENSIVE METABOLIC PANEL
ALT: 14 U/L (ref 0–44)
AST: 15 U/L (ref 15–41)
Albumin: 3 g/dL — ABNORMAL LOW (ref 3.5–5.0)
Alkaline Phosphatase: 75 U/L (ref 38–126)
Anion gap: 12 (ref 5–15)
BUN: 43 mg/dL — ABNORMAL HIGH (ref 8–23)
CO2: 25 mmol/L (ref 22–32)
Calcium: 7.8 mg/dL — ABNORMAL LOW (ref 8.9–10.3)
Chloride: 95 mmol/L — ABNORMAL LOW (ref 98–111)
Creatinine, Ser: 6.39 mg/dL — ABNORMAL HIGH (ref 0.61–1.24)
GFR, Estimated: 9 mL/min — ABNORMAL LOW (ref >60)
Glucose, Bld: 129 mg/dL — ABNORMAL HIGH (ref 70–99)
Potassium: 5.2 mmol/L — ABNORMAL HIGH (ref 3.5–5.1)
Sodium: 132 mmol/L — ABNORMAL LOW (ref 135–145)
Total Bilirubin: 0.6 mg/dL (ref 0.0–1.2)
Total Protein: 5.8 g/dL — ABNORMAL LOW (ref 6.5–8.1)

## 2023-11-19 LAB — CBC
HCT: 31.5 % — ABNORMAL LOW (ref 39.0–52.0)
Hemoglobin: 10.2 g/dL — ABNORMAL LOW (ref 13.0–17.0)
MCH: 31.6 pg (ref 26.0–34.0)
MCHC: 32.4 g/dL (ref 30.0–36.0)
MCV: 97.5 fL (ref 80.0–100.0)
Platelets: 148 10*3/uL — ABNORMAL LOW (ref 150–400)
RBC: 3.23 MIL/uL — ABNORMAL LOW (ref 4.22–5.81)
RDW: 14.3 % (ref 11.5–15.5)
WBC: 5.4 10*3/uL (ref 4.0–10.5)
nRBC: 0 % (ref 0.0–0.2)

## 2023-11-19 LAB — I-STAT CHEM 8, ED
BUN: 41 mg/dL — ABNORMAL HIGH (ref 8–23)
Calcium, Ion: 0.94 mmol/L — ABNORMAL LOW (ref 1.15–1.40)
Chloride: 96 mmol/L — ABNORMAL LOW (ref 98–111)
Creatinine, Ser: 7.2 mg/dL — ABNORMAL HIGH (ref 0.61–1.24)
Glucose, Bld: 125 mg/dL — ABNORMAL HIGH (ref 70–99)
HCT: 29 % — ABNORMAL LOW (ref 39.0–52.0)
Hemoglobin: 9.9 g/dL — ABNORMAL LOW (ref 13.0–17.0)
Potassium: 5.3 mmol/L — ABNORMAL HIGH (ref 3.5–5.1)
Sodium: 133 mmol/L — ABNORMAL LOW (ref 135–145)
TCO2: 29 mmol/L (ref 22–32)

## 2023-11-19 LAB — CBG MONITORING, ED: Glucose-Capillary: 194 mg/dL — ABNORMAL HIGH (ref 70–99)

## 2023-11-19 LAB — TROPONIN I (HIGH SENSITIVITY)
Troponin I (High Sensitivity): 161 ng/L (ref <18)
Troponin I (High Sensitivity): 169 ng/L (ref <18)

## 2023-11-19 LAB — HEPATITIS B SURFACE ANTIGEN: Hepatitis B Surface Ag: NONREACTIVE

## 2023-11-19 LAB — I-STAT CG4 LACTIC ACID, ED
Lactic Acid, Venous: 1.3 mmol/L (ref 0.5–1.9)
Lactic Acid, Venous: 1 mmol/L (ref 0.5–1.9)

## 2023-11-19 LAB — LIPASE, BLOOD: Lipase: 23 U/L (ref 11–51)

## 2023-11-19 MED ORDER — ATORVASTATIN CALCIUM 40 MG PO TABS
40.0000 mg | ORAL_TABLET | Freq: Every day | ORAL | Status: DC
  Administered 2023-11-19: 40 mg via ORAL
  Filled 2023-11-19: qty 1

## 2023-11-19 MED ORDER — GABAPENTIN 100 MG PO CAPS
100.0000 mg | ORAL_CAPSULE | Freq: Three times a day (TID) | ORAL | Status: DC
Start: 2023-11-19 — End: 2023-11-20
  Administered 2023-11-19: 100 mg via ORAL
  Filled 2023-11-19: qty 1

## 2023-11-19 MED ORDER — INSULIN ASPART 100 UNIT/ML IJ SOLN: 0.0000 [IU] | Freq: Three times a day (TID) | INTRAMUSCULAR | Status: DC

## 2023-11-19 MED ORDER — APIXABAN 5 MG PO TABS: 5.0000 mg | ORAL_TABLET | Freq: Two times a day (BID) | ORAL | Status: DC

## 2023-11-19 MED ORDER — HEPARIN SODIUM (PORCINE) 5000 UNIT/ML IJ SOLN: 5000.0000 [IU] | Freq: Three times a day (TID) | INTRAMUSCULAR | Status: DC

## 2023-11-19 MED ORDER — APIXABAN 5 MG PO TABS
5.0000 mg | ORAL_TABLET | Freq: Two times a day (BID) | ORAL | Status: DC
  Administered 2023-11-19: 5 mg via ORAL
  Filled 2023-11-19: qty 1

## 2023-11-19 MED ORDER — AMIODARONE HCL 200 MG PO TABS: 200.0000 mg | ORAL_TABLET | Freq: Every day | ORAL | Status: DC

## 2023-11-19 MED ORDER — OXYCODONE-ACETAMINOPHEN 5-325 MG PO TABS
1.0000 | ORAL_TABLET | Freq: Three times a day (TID) | ORAL | Status: DC | PRN
  Administered 2023-11-19 – 2023-11-20 (×2): 1 via ORAL
  Filled 2023-11-19 (×2): qty 1

## 2023-11-19 MED ORDER — ACETAMINOPHEN 325 MG PO TABS
650.0000 mg | ORAL_TABLET | Freq: Four times a day (QID) | ORAL | Status: DC
  Administered 2023-11-19 – 2023-11-20 (×2): 650 mg via ORAL
  Filled 2023-11-19 (×2): qty 2

## 2023-11-19 MED ORDER — FAMOTIDINE 20 MG PO TABS: 20.0000 mg | ORAL_TABLET | ORAL | Status: DC

## 2023-11-19 MED ORDER — CHLORHEXIDINE GLUCONATE CLOTH 2 % EX PADS: 6.0000 | MEDICATED_PAD | Freq: Every day | CUTANEOUS | Status: DC

## 2023-11-19 NOTE — ED Provider Notes (Signed)
 Discussed with Dr. Melanee Spry with nephrology, he will arrange dialysis I discussed the case with the internal medicine resident, they will come to admit the patient to the hospital   Harold Hong, MD 11/19/23 1622

## 2023-11-19 NOTE — Consult Note (Signed)
 [facility] Renal Consultation Note    Indication for Consultation:  Management of ESRD/hemodialysis; anemia, hypertension/volume and secondary hyperparathyroidism   HPI: [name] is a 68 y.o. male with PMH of ESRD on HD TTS, HTN, DM, PAF, CAD s/p PCI,  PAD s/p BKA. Presents to ED via EMS. Dialysis provider notes bradycardic and hypotensive during HD.  Received about of HD today. HR 40s.   Labs on admit: Na 133, K 5.2, BUN 43, Ca 7.8 HS Troponin 161, Hgb 9.9  HR 44 BP 131/62 Sinus brady on monitor.  Cardiology consulted.  Seen and examined in the ED. O2 sats 98% on room air. He's alert, oriented. Endorses some dizziness. Says he felt like he was breathing "funny". Denies chest pain, nausea/vomiting/diarrhea. Chronic volume overload. Has been leaving 10 -15 kg over dry weight   Past Medical History:  Diagnosis Date   Carotid artery occlusion 11/10/2010   LEFT CAROTID ENDARTERECTOMY   Complication of anesthesia    BP WENT UP AT [facility] "   COPD (chronic obstructive pulmonary disease) (HCC)    pt denies this dx as of 06/01/20 - no inhaler    Diabetes mellitus without complication (HCC)    Diverticulitis    Diverticulosis of colon (without mention of hemorrhage)    DJD (degenerative joint disease)    knees/hands/feet/back/neck   ESRD (end stage renal disease) on dialysis (HCC)    Fatty liver    Full dentures    GERD (gastroesophageal reflux disease)    H/O hiatal hernia    History of blood transfusion    with a past surical procedure per patient 06/01/20   Hyperlipidemia    Hypertension    Neuromuscular disorder (HCC)    peripheral neuropathy   Non-pressure chronic ulcer of other part of left foot limited to breakdown of skin (HCC) 11/12/2016   Osteomyelitis (HCC)    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 Atherectomy left popliteal artery July 2012.    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   Unstable angina (HCC) 09/16/2018   Wears glasses    Past Surgical History:  Procedure Laterality Date   ABDOMINAL AORTOGRAM W/LOWER EXTREMITY N/A 06/23/2021   Procedure: ABDOMINAL AORTOGRAM W/LOWER EXTREMITY;  Surgeon: Elder Negus, MD;  [facility];  Service: Cardiovascular;  Laterality: N/A;   AMPUTATION  11/05/2011   Procedure: AMPUTATION RAY;  Surgeon: Toni Arthurs, MD;  Location: [facility] OR;  Service: Orthopedics;  Laterality: Right;  Amputation of Right 4&5th Toes   AMPUTATION Left 11/26/2012   Procedure: AMPUTATION RAY;  Surgeon: Toni Arthurs, MD;  [facility]: [facility];  Service: Orthopedics;  Laterality: Left;  fourth ray amputation   AMPUTATION Right 08/27/2014   Procedure: Transmetatarsal Amputation;  Surgeon: Nadara Mustard, MD;  Location: [facility] OR;  Service: Orthopedics;  Laterality: Right;   AMPUTATION Right 01/14/2015   Procedure: AMPUTATION BELOW KNEE;  Surgeon: Nadara Mustard, MD;  [facility]: [facility];  Service: Orthopedics;  Laterality: Right;   AMPUTATION Left 10/21/2015   Procedure: Left Foot 5th Ray Amputation;  Surgeon: Nadara Mustard, MD;  Location: [facility] OR;  Service: Orthopedics;  Laterality: Left;   AMPUTATION Left 07/04/2022   Procedure: LEFT BELOW KNEE AMPUTATION;  Surgeon: Nadara Mustard, MD;  [facility];  Service: Orthopedics;  Laterality: Left;   ANTERIOR FUSION CERVICAL SPINE  02/06/06   C4-5, C5-6, C6-7; SURGEON DR. MAX COHEN   AV FISTULA PLACEMENT Left 06/02/2020   Procedure: ARTERIOVENOUS (AV) FISTULA CREATION LEFT;  Surgeon: Maeola Harman, MD;  Location: [facility] OR;  Service: Vascular;  Laterality: Left;   BACK SURGERY     x 3   BASCILIC VEIN TRANSPOSITION Left 07/21/2020   Procedure: LEFT UPPER ARM ATERIOVENOUS SUPERFISTULALIZATION;  Surgeon: Maeola Harman, MD;  Location: [facility] OR;  Service: Vascular;  Laterality: Left;    BELOW KNEE LEG AMPUTATION Right    CARDIAC CATHETERIZATION  10/31/04   2009   CAROTID ENDARTERECTOMY  11/10/10   CAROTID ENDARTERECTOMY Left 11/10/2010   Subtotal occlusion of left internal carotid artery with left hemispheric transient ischemic attacks.   CAROTID STENT     CARPAL TUNNEL RELEASE Right 10/21/2013   Procedure: RIGHT CARPAL TUNNEL RELEASE;  Surgeon: Nicki Reaper, MD;  Location: [facility];  Service: Orthopedics;  Laterality: Right;   CHOLECYSTECTOMY     COLON SURGERY     COLONOSCOPY     COLOSTOMY REVERSAL  05/21/2018   ileostomy reversal   CYSTOSCOPY WITH STENT PLACEMENT Bilateral 01/13/2018   Procedure: CYSTOSCOPY WITH BILATERAL URETERAL CATHETER PLACEMENT;  Surgeon: Crist Fat, MD;  Location: [facility];  Service: Urology;  Laterality: Bilateral;   ESOPHAGEAL MANOMETRY Bilateral 07/19/2014   Procedure: ESOPHAGEAL MANOMETRY (EM);  Surgeon: Beverley Fiedler, MD;  Location: [facility];  Service: Gastroenterology;  Laterality: Bilateral;   EYE SURGERY Bilateral 2020   cataract   FEMORAL ARTERY STENT     x6   FINGER SURGERY     FOOT SURGERY  04/25/2016    EXCISION BASE 5TH METATARSAL AND PARTIAL CUBOID LEFT FOOT   HERNIA REPAIR     LEFT INGUINAL AND UMBILICAL REPAIRS   HERNIA REPAIR     I & D EXTREMITY Left 04/25/2016   Procedure: EXCISION BASE 5TH METATARSAL AND PARTIAL CUBOID LEFT FOOT;  Surgeon: Nadara Mustard, MD;  [facility];  Service: Orthopedics;  Laterality: Left;   ILEOSTOMY  01/13/2018   Procedure: ILEOSTOMY;  Surgeon: Berna Bue, MD;  Location: [facility];  Service: General;;   ILEOSTOMY CLOSURE N/A 05/21/2018   Procedure: ILEOSTOMY REVERSAL ERAS PATHWAY;  Surgeon: Berna Bue, MD;  [facility];  Service: General;  Laterality: N/A;   IR RADIOLOGIST EVAL & MGMT  11/19/2017   IR RADIOLOGIST EVAL & MGMT  12/03/2017   IR RADIOLOGIST EVAL & MGMT  12/18/2017   JOINT REPLACEMENT Right 2001   Total knee   LAMINECTOMY     X 3 LUMBAR AND X 2  CERVICAL SPINE OPERATIONS   LAPAROSCOPIC CHOLECYSTECTOMY W/ CHOLANGIOGRAPHY  11/09/04   SURGEON DR. Caleen Essex   LEFT HEART CATH AND CORONARY ANGIOGRAPHY N/A 09/16/2018   Procedure: LEFT HEART CATH AND CORONARY ANGIOGRAPHY;  Surgeon: Elder Negus, MD;  [facility];  Service: Cardiovascular;  Laterality: N/A;   LEFT HEART CATHETERIZATION WITH CORONARY ANGIOGRAM N/A 10/29/2014   Procedure: LEFT HEART CATHETERIZATION WITH CORONARY ANGIOGRAM;  Surgeon: Pamella Pert, MD;  Location: [facility] CATH LAB;  Service: Cardiovascular;  Laterality: N/A;   LIGATION OF COMPETING BRANCHES OF ARTERIOVENOUS FISTULA Left 07/21/2020   Procedure: LIGATION OF COMPETING BRANCHES OF LEFT UPPER ARM ARTERIOVENOUS FISTULA;  Surgeon: Maeola Harman, MD;  Location: [facility] OR;  Service: Vascular;  Laterality: Left;  LOWER EXTREMITY ANGIOGRAM N/A 03/19/2012   Procedure: LOWER EXTREMITY ANGIOGRAM;  Surgeon: Kathleene Hazel, MD;  Location: [facility] CATH LAB;  Service: Cardiovascular;  Laterality: N/A;   LOWER EXTREMITY ANGIOGRAPHY N/A 06/20/2021   Procedure: LOWER EXTREMITY ANGIOGRAPHY;  Surgeon: Elder Negus, MD;  [facility];  Service: Cardiovascular;  Laterality: N/A;   NECK SURGERY     PARTIAL COLECTOMY N/A 01/13/2018   Procedure: LAPAROSCOPIC ASSISTED   SIGMOID COLECTOMY ILEOSTOMY;  Surgeon: Berna Bue, MD;  Location: [facility];  Service: General;  Laterality: N/A;   PENILE PROSTHESIS IMPLANT  08/14/05   INFRAPUBIC INSERTION OF INFLATABLE PENILE PROSTHESIS; SURGEON DR. Logan Bores   PENILE PROSTHESIS IMPLANT     PERCUTANEOUS CORONARY STENT INTERVENTION (PCI-S) Right 10/29/2014   Procedure: PERCUTANEOUS CORONARY STENT INTERVENTION (PCI-S);  Surgeon: Pamella Pert, MD;  Location: [facility] CATH LAB;  Service: Cardiovascular;  Laterality: Right;   PERIPHERAL VASCULAR INTERVENTION Left 06/23/2021   Procedure: PERIPHERAL VASCULAR INTERVENTION;  Surgeon: Elder Negus, MD;   [facility];  Service: Cardiovascular;  Laterality: Left;   REMOVAL OF PENILE PROSTHESIS N/A 06/14/2021   Procedure: Removal of THREE piece inflatable penile prosthesis;  Surgeon: Crista Elliot, MD;  Location: [facility] OR;  Service: Urology;  Laterality: N/A;   SHOULDER ARTHROSCOPY     SPINE SURGERY     TOE AMPUTATION Left    TONSILLECTOMY     TOTAL KNEE ARTHROPLASTY  07/2002   RIGHT KNEE ; SURGEON  DR. [name] ALSO HAD ARTHROSCOPIC RIGHT KNEE IN  10/2001   TOTAL KNEE ARTHROPLASTY     ULNAR NERVE TRANSPOSITION Right 10/21/2013   Procedure: RIGHT ELBOW  ULNAR NERVE DECOMPRESSION;  Surgeon: Nicki Reaper, MD;  Location: [facility];  Service: Orthopedics;  Laterality: Right;   Family History  Problem Relation Age of Onset   Heart disease Father        Before age 30-  CAD, BPG   Diabetes Father        Amputation   Cancer Father        PROSTATE   Hyperlipidemia Father    Hypertension Father    Heart attack Father        Triple BPG   Varicose Veins Father    Cancer Sister        Breast   Hyperlipidemia Sister    Hypertension Sister    Heart attack Brother    Colon cancer Brother    Diabetes Brother    Heart disease Brother 51       A-Fib. Before age 108   Hyperlipidemia Brother    Hypertension Brother    Hypertension Son    Arthritis Other        GRANDMOTHER   Hypertension Other        OTHER FAMILY MEMBERS   Social History:  reports that he has been smoking cigarettes. He has a 17.5 pack-year smoking history. He has never used smokeless tobacco. He reports that he does not currently use alcohol. He reports that he does not use drugs. Allergies  Allergen Reactions   Contrast Media [Iodinated Contrast Media] Shortness Of Breath and Other (See Comments)    Difficulty breathing and altered mental status     Ivp Dye [Iodinated Contrast Media] Anaphylaxis, Shortness Of Breath and Other (See Comments)    Breathing problems, altered mental state     Latex Rash and Other (See Comments)    A severe rash appears  after the first 24 hours of being placed   Tape Other (See Comments) and Rash    Rash after 1 day of use  Do not leave on longer then 3 days with out be changed   Prior to Admission medications   Medication Sig Start Date End Date Taking? Authorizing Provider  amiodarone (PACERONE) 200 MG tablet Take 1 tablet (200 mg total) by mouth daily. 10/29/22   Yates Decamp, MD  amLODipine (NORVASC) 10 MG tablet Take 10 mg by mouth daily.    [provider]  apixaban (ELIQUIS) 5 MG TABS tablet Take 1 tablet (5 mg total) by mouth 2 (two) times daily. 01/14/22 03/09/24  Almon Hercules, MD  atorvastatin (LIPITOR) 40 MG tablet Take 40 mg by mouth at bedtime.     [provider]  B Complex-C-Zn-Folic Acid (DIALYVITE/ZINC) TABS Take 1 tablet by mouth Every Tuesday,Thursday,and Saturday with dialysis. 10/04/21   [provider]  famotidine (PEPCID) 20 MG tablet Take 20 mg by mouth every other day.    [provider]  fentaNYL (DURAGESIC) 50 MCG/HR 1 patch every 3 (three) days. 07/25/23   [provider]  folic acid (FOLVITE) 1 MG tablet Take 1 mg by mouth daily. 09/18/21   [provider]  gabapentin (NEURONTIN) 100 MG capsule Take 100 mg by mouth 3 (three) times daily.    [provider]  hydrALAZINE (APRESOLINE) 50 MG tablet Take 1 tablet (50 mg total) by mouth every 8 (eight) hours. Patient taking differently: Take 50 mg by mouth 4 (four) times daily. 04/01/23   [name], MD  Icosapent Ethyl 0.5 g CAPS Take 1 capsule by mouth daily.    [provider]  insulin aspart (NOVOLOG FLEXPEN) 100 UNIT/ML FlexPen Inject 0-12 Units into the skin See admin instructions. Inject 4 units into the skin with meals in addition to sliding scale as needed. Inject as per sliding scale: If 0 - 69 = 0 initiate hypoglycemia orders;  70 - 150 = 0;  151 - 200 = 2;  201 - 250 = 4;  251 - 300 = 6;  301 -  350 = 8;  351 - 400 = 10;  401 - 450 = 12.  If >400, give 12 units, recheck in 1 hour. If still >400, call MD/NP for further orders. Subcutaneously with meals in addition to scheduled 4 unites with meals.    [provider]  insulin aspart (NOVOLOG) 100 UNIT/ML injection Inject 4 Units into the skin 3 (three) times daily with meals.    [provider]  isosorbide mononitrate (IMDUR) 60 MG 24 hr tablet Take 1 tablet (60 mg total) by mouth daily. 01/21/23 01/08/24  Yates Decamp, MD  labetalol (NORMODYNE) 200 MG tablet Take 1 tablet (200 mg total) by mouth 2 (two) times daily. 04/01/23   [name], MD  lidocaine-prilocaine (EMLA) cream Apply 1 application topically See admin instructions. Prior to Dialysis days Tuesday,Thursday and saturday 06/06/21   [provider]  loratadine (CLARITIN) 10 MG tablet Take 10 mg by mouth daily.    [provider]  midodrine (PROAMATINE) 10 MG tablet Take 10 mg by mouth daily as needed (SBP less than 100.).    [provider]  mineral oil-hydrophilic petrolatum (AQUAPHOR) ointment Apply 1 Application topically as needed for dry skin or irritation (Upper thigh).    [provider]  nitroGLYCERIN (NITROSTAT) 0.4 MG SL tablet Place 0.4 mg under the tongue every 5 (five) minutes  as needed for chest pain. 01/06/22   [provider]  Nystatin ([name]'S BUTT CREAM) CREA Apply 1 Application topically 3 (three) times daily as needed for irritation. Apply to irritated area on sacrum    [provider]  nystatin cream (MYCOSTATIN) Apply 1 Application topically daily. Apply to  upper thigh    [provider]  ondansetron (ZOFRAN) 4 MG tablet Take 1 tablet (4 mg total) by mouth every 8 (eight) hours as needed for nausea or vomiting. 04/24/23   Yates Decamp, MD  oxyCODONE-acetaminophen (PERCOCET/ROXICET) 5-325 MG tablet Take 1 tablet by mouth every 8 (eight) hours as needed for severe pain. 05/31/23    Ghimire, Werner Lean, MD  promethazine (PHENERGAN) 12.5 MG suppository Place 12.5 mg rectally daily as needed for nausea or vomiting.    [provider]  rOPINIRole (REQUIP) 2 MG tablet Take 2 mg by mouth at bedtime.    [provider]  tamsulosin (FLOMAX) 0.4 MG CAPS capsule Take 0.4 mg by mouth daily.    [provider]  trolamine salicylate (ASPERCREME) 10 % cream Apply 1 Application topically as needed for muscle pain. Apply to dialysis shunt/port as needed for pain/    [provider]   No current facility-administered medications for this encounter.   Current Outpatient Medications  Medication Sig Dispense Refill   amiodarone (PACERONE) 200 MG tablet Take 1 tablet (200 mg total) by mouth daily. 100 tablet 1   amLODipine (NORVASC) 10 MG tablet Take 10 mg by mouth daily.     apixaban (ELIQUIS) 5 MG TABS tablet Take 1 tablet (5 mg total) by mouth 2 (two) times daily. 60 tablet 1   atorvastatin (LIPITOR) 40 MG tablet Take 40 mg by mouth at bedtime.      B Complex-C-Zn-Folic Acid (DIALYVITE/ZINC) TABS Take 1 tablet by mouth Every Tuesday,Thursday,and Saturday with dialysis.     famotidine (PEPCID) 20 MG tablet Take 20 mg by mouth every other day.     fentaNYL (DURAGESIC) 50 MCG/HR 1 patch every 3 (three) days.     folic acid (FOLVITE) 1 MG tablet Take 1 mg by mouth daily.     gabapentin (NEURONTIN) 100 MG capsule Take 100 mg by mouth 3 (three) times daily.     hydrALAZINE (APRESOLINE) 50 MG tablet Take 1 tablet (50 mg total) by mouth every 8 (eight) hours. (Patient taking differently: Take 50 mg by mouth 4 (four) times daily.)     Icosapent Ethyl 0.5 g CAPS Take 1 capsule by mouth daily.     insulin aspart (NOVOLOG FLEXPEN) 100 UNIT/ML FlexPen Inject 0-12 Units into the skin See admin instructions. Inject 4 units into the skin with meals in addition to sliding scale as needed. Inject as per sliding scale: If 0 - 69 = 0 initiate hypoglycemia orders;  70 - 150  = 0;  151 - 200 = 2;  201 - 250 = 4;  251 - 300 = 6;  301 - 350 = 8;  351 - 400 = 10;  401 - 450 = 12.  If >400, give 12 units, recheck in 1 hour. If still >400, call MD/NP for further orders. Subcutaneously with meals in addition to scheduled 4 unites with meals.     insulin aspart (NOVOLOG) 100 UNIT/ML injection Inject 4 Units into the skin 3 (three) times daily with meals.     isosorbide mononitrate (IMDUR) 60 MG 24 hr tablet Take 1 tablet (60 mg total) by mouth daily. 90 tablet 1  labetalol (NORMODYNE) 200 MG tablet Take 1 tablet (200 mg total) by mouth 2 (two) times daily.     lidocaine-prilocaine (EMLA) cream Apply 1 application topically See admin instructions. Prior to Dialysis days Tuesday,Thursday and saturday     loratadine (CLARITIN) 10 MG tablet Take 10 mg by mouth daily.     midodrine (PROAMATINE) 10 MG tablet Take 10 mg by mouth daily as needed (SBP less than 100.).     mineral oil-hydrophilic petrolatum (AQUAPHOR) ointment Apply 1 Application topically as needed for dry skin or irritation (Upper thigh).     nitroGLYCERIN (NITROSTAT) 0.4 MG SL tablet Place 0.4 mg under the tongue every 5 (five) minutes as needed for chest pain.     Nystatin ([name]'S BUTT CREAM) CREA Apply 1 Application topically 3 (three) times daily as needed for irritation. Apply to irritated area on sacrum     nystatin cream (MYCOSTATIN) Apply 1 Application topically daily. Apply to  upper thigh     ondansetron (ZOFRAN) 4 MG tablet Take 1 tablet (4 mg total) by mouth every 8 (eight) hours as needed for nausea or vomiting. 60 tablet 0   oxyCODONE-acetaminophen (PERCOCET/ROXICET) 5-325 MG tablet Take 1 tablet by mouth every 8 (eight) hours as needed for severe pain. 15 tablet 0   promethazine (PHENERGAN) 12.5 MG suppository Place 12.5 mg rectally daily as needed for nausea or vomiting.     rOPINIRole (REQUIP) 2 MG tablet Take 2 mg by mouth at bedtime.     tamsulosin (FLOMAX) 0.4 MG CAPS capsule Take 0.4 mg  by mouth daily.     trolamine salicylate (ASPERCREME) 10 % cream Apply 1 Application topically as needed for muscle pain. Apply to dialysis shunt/port as needed for pain/     ROS: As per HPI otherwise negative.  Physical Exam: Vitals:   11/19/23 1354 11/19/23 1403 11/19/23 1515  BP:  123/77 (!) 112/52  Pulse:  (!) 45 (!) 41  Resp:  12 18  Temp: 98.8 F (37.1 C)    TempSrc: Oral    SpO2:  94% 95%     General: Alert, sitting up, nad, on room air  Head: NCAT sclera not icteric Neck: Supple. No JVD appreciated  Lungs: Distant, Normal wob  Heart: Bradycardic  Abdomen: soft non-tender, no masses  Lower extremities:bilateral BKA + stump edema  Neuro: A & O X 3. Moves all extremities spontaneously. Psych:  Responds to questions appropriately  Dialysis Access: LUE AVF +bruit   Labs: Basic Metabolic Panel: Recent Labs  Lab 11/19/23 1359 11/19/23 1436  NA 132* 133*  K 5.2* 5.3*  CL 95* 96*  CO2 25  --   GLUCOSE 129* 125*  BUN 43* 41*  CREATININE 6.39* 7.20*  CALCIUM 7.8*  --    Liver Function Tests: Recent Labs  Lab 11/19/23 1359  AST 15  ALT 14  ALKPHOS 75  BILITOT 0.6  PROT 5.8*  ALBUMIN 3.0*   Recent Labs  Lab 11/19/23 1359  LIPASE 23   No results for input(s): "AMMONIA" in the last 168 hours. CBC: Recent Labs  Lab 11/19/23 1359 11/19/23 1436  WBC 5.0  --   NEUTROABS 3.3  --   HGB 9.9* 9.9*  HCT 30.1* 29.0*  MCV 96.5  --   PLT 146*  --    Cardiac Enzymes: No results for input(s): "CKTOTAL", "CKMB", "CKMBINDEX", "TROPONINI" in the last 168 hours. CBG: No results for input(s): "GLUCAP" in the last 168 hours. Iron Studies: No results for input(s): "IRON", "  TIBC", "TRANSFERRIN", "FERRITIN" in the last 72 hours. Studies/Results: No results found.  Dialysis Orders:  [facility] TTS 4:15 450/800 EDW 107kg 2K/2Ca  AVF No heparin Not reaching EDW. Post HD wt 3/8 - 117kg -Mircera 150 q 2 wks (last 2/22) Venofer 50 q wk -Hectorol 2 q  HD -  Assessment/Plan: Symptomatic bradycardia - cardiology consulted. K 5.2 after dialysis today, less likely related to hyperkalemia.  Elevated troponin/CAD - per cardiology  ESRD -  HD TTS. Truncated treatment today. Resume HD in the am.  Chronic volume overload - ongoing issue. No respiratory distress. UF as able.  Hypertension  - amlodipine, hydralazine, labetalol on med list. Also on midodrine-- Hold BP meds to allow for UF.  Anemia  - On ESA as outpatient. Follow trends here  Metabolic bone disease -  Continue home meds PAfib - on amiodarone, Eliquis.   Tomasa Blase PA-C [facility] 11/19/2023, 3:59 PM

## 2023-11-19 NOTE — Hospital Course (Addendum)
  Bradycardia [name] presented to [place] from his dialysis center due to concerns for hypotension and bradycardia in the 70s.  On arrival to the ED patient was still bradycardic in the 60s.  Cardiology was consulted who saw the patient.  His bradycardia was thought to be due to his labetalol so we held it.. We also held his hydralazine, amlodipine, and Imdur.  Patient heart rate resolved to normal limits after these medications were held.  In the setting of his resolved bradycardia which was the main reason why the patient was admitted, we followed up with cardiology and nephrology who thought it was appropriate to discharge at this time.  Patient is agreeable to the plan and was discharged back to his nursing facility.  Plan was to continue to hold these medications until he follows up with his nephrologist to reassess and resume when appropriate.  Chest pain This patient also reported chest tightness which is concerning for atypical angina.  Pain was somewhat reproducible on palpation.  Did an EKG which was reassuring against ischemia and his troponins peaked on delta.  He was continued on supportive care throughout his hospitalization.  ESRD On HD Tuesday Thursday and Saturday end-stage renal disease.  Acknowledges that he sometimes misses sessions .  Nephrology scheduled having dialyzed today.  Paroxysmal A-fib His Eliquis and amiodarone was continued

## 2023-11-19 NOTE — Consult Note (Signed)
 Cardiology Consultation   Patient ID: DEMITRI KUCINSKI MRN: [id number]; DOB: 02-28-1956  Admit date: 11/19/2023 Date of Consult: 11/19/2023  PCP:  Lonie Peak, PA-C   [facility] Providers Cardiologist:  None        Patient Profile:   [name] is a 68 y.o. male with a hx of severe PAD with multiple prior interventions, s/p bilateral BKA, uncontrolled DM with severe diabetic retinopathy, CAD s/p angioplasty to RCA, prior right carotid endarterectomy 2012, OSA, ESRD (Tues/Thurs/Sat), tobacco use, paroxysmal atrial fibrillation, hypertension who is being seen 11/19/2023 for the evaluation of bradycardia at the request of Dr. Rodena Medin.  History of Present Illness:   [name] presented to the ED with EMS today from his dialysis facility. He was only able to complete 45 min before developing hypotension, bradycardia, fatigue. In the ED, BP 123/77, 112/36mmHg, HR 41bpm. Labs notable for potassium 5.2, creatinine 6.39, albumin 3.0, troponin 161.   Patient has been followed regularly by Dr. Jacinto Halim, last seen 04/24/23. Has history of multiple admissions secondary to hypertensive emergency and chest pain. At his last office visit, appears that there was discussion of more conservative management of cardiac conditions.     Past Medical History:  Diagnosis Date   Carotid artery occlusion 11/10/2010   LEFT CAROTID ENDARTERECTOMY   Complication of anesthesia    BP WENT UP AT [facility] "   COPD (chronic obstructive pulmonary disease) (HCC)    pt denies this dx as of 06/01/20 - no inhaler    Diabetes mellitus without complication (HCC)    Diverticulitis    Diverticulosis of colon (without mention of hemorrhage)    DJD (degenerative joint disease)    knees/hands/feet/back/neck   ESRD (end stage renal disease) on dialysis (HCC)    Fatty liver    Full dentures    GERD (gastroesophageal reflux disease)    H/O hiatal hernia    History of blood transfusion    with a past surical  procedure per patient 06/01/20   Hyperlipidemia    Hypertension    Neuromuscular disorder (HCC)    peripheral neuropathy   Non-pressure chronic ulcer of other part of left foot limited to breakdown of skin (HCC) 11/12/2016   Osteomyelitis (HCC)    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 Atherectomy left popliteal artery July 2012.    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   Unstable angina (HCC) 09/16/2018   Wears glasses     Past Surgical History:  Procedure Laterality Date   ABDOMINAL AORTOGRAM W/LOWER EXTREMITY N/A 06/23/2021   Procedure: ABDOMINAL AORTOGRAM W/LOWER EXTREMITY;  Surgeon: Elder Negus, MD;  [facility];  Service: Cardiovascular;  Laterality: N/A;   AMPUTATION  11/05/2011   Procedure: AMPUTATION RAY;  Surgeon: Toni Arthurs, MD;  Location: [facility] OR;  Service: Orthopedics;  Laterality: Right;  Amputation of Right 4&5th Toes   AMPUTATION Left 11/26/2012   Procedure: AMPUTATION RAY;  Surgeon: Toni Arthurs, MD;  Location: [facility] OR;  Service: Orthopedics;  Laterality: Left;  fourth ray amputation   AMPUTATION Right 08/27/2014   Procedure: Transmetatarsal Amputation;  Surgeon: Nadara Mustard, MD;  Location: [facility] OR;  Service: Orthopedics;  Laterality: Right;   AMPUTATION Right 01/14/2015   Procedure: AMPUTATION BELOW KNEE;  Surgeon: Nadara Mustard, MD;  Location: [facility] OR;  Service: Orthopedics;  Laterality: Right;   AMPUTATION Left 10/21/2015   Procedure: Left Foot 5th Ray Amputation;  Surgeon: Nadara Mustard, MD;  Location: [facility] OR;  Service: Orthopedics;  Laterality: Left;   AMPUTATION Left 07/04/2022   Procedure: LEFT BELOW KNEE AMPUTATION;  Surgeon: Nadara Mustard, MD;  Location: [facility] OR;  Service: Orthopedics;  Laterality: Left;   ANTERIOR FUSION CERVICAL SPINE  02/06/06   C4-5, C5-6, C6-7;  SURGEON DR. MAX COHEN   AV FISTULA PLACEMENT Left 06/02/2020   Procedure: ARTERIOVENOUS (AV) FISTULA CREATION LEFT;  Surgeon: Maeola Harman, MD;  Location: [facility] OR;  Service: Vascular;  Laterality: Left;   BACK SURGERY     x 3   BASCILIC VEIN TRANSPOSITION Left 07/21/2020   Procedure: LEFT UPPER ARM ATERIOVENOUS SUPERFISTULALIZATION;  Surgeon: Maeola Harman, MD;  Location: [facility] OR;  Service: Vascular;  Laterality: Left;   BELOW KNEE LEG AMPUTATION Right    CARDIAC CATHETERIZATION  10/31/04   2009   CAROTID ENDARTERECTOMY  11/10/10   CAROTID ENDARTERECTOMY Left 11/10/2010   Subtotal occlusion of left internal carotid artery with left hemispheric transient ischemic attacks.   CAROTID STENT     CARPAL TUNNEL RELEASE Right 10/21/2013   Procedure: RIGHT CARPAL TUNNEL RELEASE;  Surgeon: Nicki Reaper, MD;  Location: [facility];  Service: Orthopedics;  Laterality: Right;   CHOLECYSTECTOMY     COLON SURGERY     COLONOSCOPY     COLOSTOMY REVERSAL  05/21/2018   ileostomy reversal   CYSTOSCOPY WITH STENT PLACEMENT Bilateral 01/13/2018   Procedure: CYSTOSCOPY WITH BILATERAL URETERAL CATHETER PLACEMENT;  Surgeon: Crist Fat, MD;  Location: [facility] ORS;  Service: Urology;  Laterality: Bilateral;   ESOPHAGEAL MANOMETRY Bilateral 07/19/2014   Procedure: ESOPHAGEAL MANOMETRY (EM);  Surgeon: Beverley Fiedler, MD;  Location: [facility];  Service: Gastroenterology;  Laterality: Bilateral;   EYE SURGERY Bilateral 2020   cataract   FEMORAL ARTERY STENT     x6   FINGER SURGERY     FOOT SURGERY  04/25/2016    EXCISION BASE 5TH METATARSAL AND PARTIAL CUBOID LEFT FOOT   HERNIA REPAIR     LEFT INGUINAL AND UMBILICAL REPAIRS   HERNIA REPAIR     I & D EXTREMITY Left 04/25/2016   Procedure: EXCISION BASE 5TH METATARSAL AND PARTIAL CUBOID LEFT FOOT;  Surgeon: Nadara Mustard, MD;  [facility];  Service: Orthopedics;  Laterality: Left;   ILEOSTOMY  01/13/2018   Procedure: ILEOSTOMY;   Surgeon: Berna Bue, MD;  Location: [facility];  Service: General;;   ILEOSTOMY CLOSURE N/A 05/21/2018   Procedure: ILEOSTOMY REVERSAL ERAS PATHWAY;  Surgeon: Berna Bue, MD;  Location: [facility];  Service: General;  Laterality: N/A;   IR RADIOLOGIST EVAL & MGMT  11/19/2017   IR RADIOLOGIST EVAL & MGMT  12/03/2017   IR RADIOLOGIST EVAL & MGMT  12/18/2017   JOINT REPLACEMENT Right 2001   Total knee   LAMINECTOMY     X 3 LUMBAR AND X 2 CERVICAL SPINE OPERATIONS   LAPAROSCOPIC CHOLECYSTECTOMY W/ CHOLANGIOGRAPHY  11/09/04   SURGEON DR. Caleen Essex   LEFT HEART CATH AND CORONARY ANGIOGRAPHY N/A 09/16/2018   Procedure: LEFT HEART CATH AND CORONARY ANGIOGRAPHY;  Surgeon: Elder Negus, MD;  [facility];  Service: Cardiovascular;  Laterality: N/A;   LEFT HEART CATHETERIZATION WITH CORONARY ANGIOGRAM N/A 10/29/2014   Procedure: LEFT  HEART CATHETERIZATION WITH CORONARY ANGIOGRAM;  Surgeon: Pamella Pert, MD;  Location: [facility] CATH LAB;  Service: Cardiovascular;  Laterality: N/A;   LIGATION OF COMPETING BRANCHES OF ARTERIOVENOUS FISTULA Left 07/21/2020   Procedure: LIGATION OF COMPETING BRANCHES OF LEFT UPPER ARM ARTERIOVENOUS FISTULA;  Surgeon: Maeola Harman, MD;  Location: [facility] OR;  Service: Vascular;  Laterality: Left;   LOWER EXTREMITY ANGIOGRAM N/A 03/19/2012   Procedure: LOWER EXTREMITY ANGIOGRAM;  Surgeon: Kathleene Hazel, MD;  Location: [facility] CATH LAB;  Service: Cardiovascular;  Laterality: N/A;   LOWER EXTREMITY ANGIOGRAPHY N/A 06/20/2021   Procedure: LOWER EXTREMITY ANGIOGRAPHY;  Surgeon: Elder Negus, MD;  [facility];  Service: Cardiovascular;  Laterality: N/A;   NECK SURGERY     PARTIAL COLECTOMY N/A 01/13/2018   Procedure: LAPAROSCOPIC ASSISTED   SIGMOID COLECTOMY ILEOSTOMY;  Surgeon: Berna Bue, MD;  Location: [facility];  Service: General;  Laterality: N/A;   PENILE PROSTHESIS IMPLANT  08/14/05   INFRAPUBIC INSERTION OF  INFLATABLE PENILE PROSTHESIS; SURGEON DR. Logan Bores   PENILE PROSTHESIS IMPLANT     PERCUTANEOUS CORONARY STENT INTERVENTION (PCI-S) Right 10/29/2014   Procedure: PERCUTANEOUS CORONARY STENT INTERVENTION (PCI-S);  Surgeon: Pamella Pert, MD;  Location: [facility] CATH LAB;  Service: Cardiovascular;  Laterality: Right;   PERIPHERAL VASCULAR INTERVENTION Left 06/23/2021   Procedure: PERIPHERAL VASCULAR INTERVENTION;  Surgeon: Elder Negus, MD;  [facility];  Service: Cardiovascular;  Laterality: Left;   REMOVAL OF PENILE PROSTHESIS N/A 06/14/2021   Procedure: Removal of THREE piece inflatable penile prosthesis;  Surgeon: Crista Elliot, MD;  Location: [facility] OR;  Service: Urology;  Laterality: N/A;   SHOULDER ARTHROSCOPY     SPINE SURGERY     TOE AMPUTATION Left    TONSILLECTOMY     TOTAL KNEE ARTHROPLASTY  07/2002   RIGHT KNEE ; SURGEON  DR. [name] ALSO HAD ARTHROSCOPIC RIGHT KNEE IN  10/2001   TOTAL KNEE ARTHROPLASTY     ULNAR NERVE TRANSPOSITION Right 10/21/2013   Procedure: RIGHT ELBOW  ULNAR NERVE DECOMPRESSION;  Surgeon: Nicki Reaper, MD;  Location: [facility];  Service: Orthopedics;  Laterality: Right;     Home Medications:  Prior to Admission medications   Medication Sig Start Date End Date Taking? Authorizing Provider  amiodarone (PACERONE) 200 MG tablet Take 1 tablet (200 mg total) by mouth daily. 10/29/22   Yates Decamp, MD  amLODipine (NORVASC) 10 MG tablet Take 10 mg by mouth daily.    [provider]  apixaban (ELIQUIS) 5 MG TABS tablet Take 1 tablet (5 mg total) by mouth 2 (two) times daily. 01/14/22 03/09/24  Almon Hercules, MD  atorvastatin (LIPITOR) 40 MG tablet Take 40 mg by mouth at bedtime.     [provider]  B Complex-C-Zn-Folic Acid (DIALYVITE/ZINC) TABS Take 1 tablet by mouth Every Tuesday,Thursday,and Saturday with dialysis. 10/04/21   [provider]  famotidine (PEPCID) 20 MG tablet Take 20 mg by mouth every other  day.    [provider]  fentaNYL (DURAGESIC) 50 MCG/HR 1 patch every 3 (three) days. 07/25/23   [provider]  folic acid (FOLVITE) 1 MG tablet Take 1 mg by mouth daily. 09/18/21   [provider]  gabapentin (NEURONTIN) 100 MG capsule Take 100 mg by mouth 3 (three) times daily.    [provider]  hydrALAZINE (APRESOLINE) 50 MG tablet Take 1 tablet (50 mg total) by mouth every 8 (eight) hours.  Patient taking differently: Take 50 mg by mouth 4 (four) times daily. 04/01/23   [name], MD  Icosapent Ethyl 0.5 g CAPS Take 1 capsule by mouth daily.    [provider]  insulin aspart (NOVOLOG FLEXPEN) 100 UNIT/ML FlexPen Inject 0-12 Units into the skin See admin instructions. Inject 4 units into the skin with meals in addition to sliding scale as needed. Inject as per sliding scale: If 0 - 69 = 0 initiate hypoglycemia orders;  70 - 150 = 0;  151 - 200 = 2;  201 - 250 = 4;  251 - 300 = 6;  301 - 350 = 8;  351 - 400 = 10;  401 - 450 = 12.  If >400, give 12 units, recheck in 1 hour. If still >400, call MD/NP for further orders. Subcutaneously with meals in addition to scheduled 4 unites with meals.    [provider]  insulin aspart (NOVOLOG) 100 UNIT/ML injection Inject 4 Units into the skin 3 (three) times daily with meals.    [provider]  isosorbide mononitrate (IMDUR) 60 MG 24 hr tablet Take 1 tablet (60 mg total) by mouth daily. 01/21/23 01/08/24  Yates Decamp, MD  labetalol (NORMODYNE) 200 MG tablet Take 1 tablet (200 mg total) by mouth 2 (two) times daily. 04/01/23   [name], MD  lidocaine-prilocaine (EMLA) cream Apply 1 application topically See admin instructions. Prior to Dialysis days Tuesday,Thursday and saturday 06/06/21   [provider]  loratadine (CLARITIN) 10 MG tablet Take 10 mg by mouth daily.    [provider]  midodrine (PROAMATINE) 10 MG tablet Take 10 mg by mouth daily as needed (SBP  less than 100.).    [provider]  mineral oil-hydrophilic petrolatum (AQUAPHOR) ointment Apply 1 Application topically as needed for dry skin or irritation (Upper thigh).    [provider]  nitroGLYCERIN (NITROSTAT) 0.4 MG SL tablet Place 0.4 mg under the tongue every 5 (five) minutes as needed for chest pain. 01/06/22   [provider]  Nystatin ([name]'S BUTT CREAM) CREA Apply 1 Application topically 3 (three) times daily as needed for irritation. Apply to irritated area on sacrum    [provider]  nystatin cream (MYCOSTATIN) Apply 1 Application topically daily. Apply to  upper thigh    [provider]  ondansetron (ZOFRAN) 4 MG tablet Take 1 tablet (4 mg total) by mouth every 8 (eight) hours as needed for nausea or vomiting. 04/24/23   Yates Decamp, MD  oxyCODONE-acetaminophen (PERCOCET/ROXICET) 5-325 MG tablet Take 1 tablet by mouth every 8 (eight) hours as needed for severe pain. 05/31/23   Ghimire, Werner Lean, MD  promethazine (PHENERGAN) 12.5 MG suppository Place 12.5 mg rectally daily as needed for nausea or vomiting.    [provider]  rOPINIRole (REQUIP) 2 MG tablet Take 2 mg by mouth at bedtime.    [provider]  tamsulosin (FLOMAX) 0.4 MG CAPS capsule Take 0.4 mg by mouth daily.    [provider]  trolamine salicylate (ASPERCREME) 10 % cream Apply 1 Application topically as needed for muscle pain. Apply to dialysis shunt/port as needed for pain/    [provider]    Inpatient Medications: Scheduled Meds:  Continuous Infusions:  PRN Meds:   Allergies:    Allergies  Allergen Reactions   Contrast Media [Iodinated Contrast Media] Shortness Of Breath and Other (See Comments)    Difficulty breathing and altered mental status  Ivp Dye [Iodinated Contrast Media] Anaphylaxis, Shortness Of Breath and Other (See Comments)    Breathing problems, altered mental state    Latex Rash and Other (See  Comments)    A severe rash appears after the first 24 hours of being placed   Tape Other (See Comments) and Rash    Rash after 1 day of use  Do not leave on longer then 3 days with out be changed    Social History:   Social History   Socioeconomic History   Marital status: Married    Spouse name: Not on file   Number of children: 3   Years of education: Not on file   Highest education level: Not on file  Occupational History   Occupation: TRUCK DRIVER    Employer: UNEMPLOYED  Tobacco Use   Smoking status: Every Day    Current packs/day: 0.50    Average packs/day: 0.5 packs/day for 35.0 years (17.5 ttl pk-yrs)    Types: Cigarettes   Smokeless tobacco: Never  Vaping Use   Vaping status: Every Day   Substances: Nicotine  Substance and Sexual Activity   Alcohol use: Not Currently    Comment: "not in a long time"   Drug use: Never   Sexual activity: Yes    Birth control/protection: Implant    Comment: penile implant  Other Topics Concern   Not on file  Social History Narrative   ** Merged History Encounter **       HEAVY SMOKER AND CONTINUES TO SMOKE 1 PPD. DOES NOT EXERCISE REGULARLY.  Wife [phone] Otho Najjar). Has 2 sons and daughter. Still active       Social Drivers of Corporate investment banker Strain: Not on file  Food Insecurity: No Food Insecurity (09/26/2023)   Hunger Vital Sign    Worried About Running Out of Food in the Last Year: Never true    Ran Out of Food in the Last Year: Never true  Transportation Needs: No Transportation Needs (09/26/2023)   PRAPARE - Administrator, Civil Service (Medical): No    Lack of Transportation (Non-Medical): No  Physical Activity: Not on file  Stress: Not on file  Social Connections: Moderately Isolated (09/26/2023)   Social Connection and Isolation Panel [NHANES]    Frequency of Communication with Friends and Family: More than three times a week    Frequency of Social Gatherings with Friends and  Family: More than three times a week    Attends Religious Services: Never    Database administrator or Organizations: No    Attends Banker Meetings: Never    Marital Status: Married  Catering manager Violence: Not At Risk (09/26/2023)   Humiliation, Afraid, Rape, and Kick questionnaire    Fear of Current or Ex-Partner: No    Emotionally Abused: No    Physically Abused: No    Sexually Abused: No    Family History:    Family History  Problem Relation Age of Onset   Heart disease Father        Before age 97-  CAD, BPG   Diabetes Father        Amputation   Cancer Father        PROSTATE   Hyperlipidemia Father    Hypertension Father    Heart attack Father        Triple BPG   Varicose Veins Father    Cancer Sister        Breast  Hyperlipidemia Sister    Hypertension Sister    Heart attack Brother    Colon cancer Brother    Diabetes Brother    Heart disease Brother 34       A-Fib. Before age 29   Hyperlipidemia Brother    Hypertension Brother    Hypertension Son    Arthritis Other        GRANDMOTHER   Hypertension Other        OTHER FAMILY MEMBERS     ROS:  Please see the history of present illness.   All other ROS reviewed and negative.     Physical Exam/Data:   Vitals:   11/19/23 1354 11/19/23 1403 11/19/23 1515 11/19/23 1612  BP:  123/77 (!) 112/52 131/61  Pulse:  (!) 45 (!) 41 (!) 44  Resp:  12 18 17   Temp: 98.8 F (37.1 C)   98.1 F (36.7 C)  TempSrc: Oral     SpO2:  94% 95% 98%   No intake or output data in the 24 hours ending 11/19/23 1619    09/26/2023    1:41 PM 09/25/2023    8:59 AM 09/22/2023    1:25 AM  Last 3 Weights  Weight (lbs) 227 lb 1.2 oz 229 lb 4.5 oz 270 lb  Weight (kg) 103 kg 104 kg 122.471 kg     There is no height or weight on file to calculate BMI.  General:  Well nourished, well developed, sleepy appearing HEENT: normal Neck: no JVD Vascular: No carotid bruits; Distal pulses 2+ bilaterally Cardiac:  normal S1,  S2; RRR; no murmur  Lungs:  clear to auscultation bilaterally, no wheezing, rhonchi or rales  Abd: soft, nontender, no hepatomegaly  Ext: bilateral BKA, 2+ pitting edema bilaterally.  Musculoskeletal:  No deformities, BUE and BLE strength normal and equal Skin: warm and dry  Neuro:  CNs 2-12 intact, no focal abnormalities noted Psych:  Normal affect   EKG:  The EKG was personally reviewed and demonstrates:  sinus bradycardia with 1st degree AVB Telemetry:  Telemetry was personally reviewed and demonstrates:  sinus bradycardia, long PR  Relevant CV Studies:  03/31/23 TTE  IMPRESSIONS     1. Left ventricular ejection fraction, by estimation, is 60 to 65%. The  left ventricle has normal function. The left ventricle has no regional  wall motion abnormalities. There is mild concentric left ventricular  hypertrophy. Left ventricular diastolic  parameters are consistent with Grade I diastolic dysfunction (impaired  relaxation). Elevated left atrial pressure.   2. Right ventricular systolic function is normal. The right ventricular  size is normal.   3. Left atrial size was moderately dilated.   4. The mitral valve is normal in structure. No evidence of mitral valve  regurgitation. No evidence of mitral stenosis. Moderate mitral annular  calcification.   5. The aortic valve is tricuspid. Aortic valve regurgitation is not  visualized. Aortic valve sclerosis/calcification is present, without any  evidence of aortic stenosis.   6. The inferior vena cava is dilated in size with >50% respiratory  variability, suggesting right atrial pressure of 8 mmHg.   FINDINGS   Left Ventricle: Left ventricular ejection fraction, by estimation, is 60  to 65%. The left ventricle has normal function. The left ventricle has no  regional wall motion abnormalities. The left ventricular internal cavity  size was normal in size. There is   mild concentric left ventricular hypertrophy. Left ventricular  diastolic  parameters are consistent with Grade I diastolic dysfunction (impaired  relaxation). Elevated left atrial pressure.   Right Ventricle: The right ventricular size is normal. Right ventricular  systolic function is normal.   Left Atrium: Left atrial size was moderately dilated.   Right Atrium: Right atrial size was normal in size.   Pericardium: There is no evidence of pericardial effusion.   Mitral Valve: The mitral valve is normal in structure. Moderate mitral  annular calcification. No evidence of mitral valve regurgitation. No  evidence of mitral valve stenosis. MV peak gradient, 6.2 mmHg. The mean  mitral valve gradient is 3.0 mmHg.   Tricuspid Valve: The tricuspid valve is normal in structure. Tricuspid  valve regurgitation is trivial. No evidence of tricuspid stenosis.   Aortic Valve: The aortic valve is tricuspid. Aortic valve regurgitation is  not visualized. Aortic valve sclerosis/calcification is present, without  any evidence of aortic stenosis. Aortic valve mean gradient measures 8.0  mmHg. Aortic valve peak gradient  measures 14.7 mmHg. Aortic valve area, by VTI measures 3.15 cm.   Pulmonic Valve: The pulmonic valve was normal in structure. Pulmonic valve  regurgitation is trivial. No evidence of pulmonic stenosis.   Aorta: The aortic root is normal in size and structure.   Venous: The inferior vena cava is dilated in size with greater than 50%  respiratory variability, suggesting right atrial pressure of 8 mmHg.   IAS/Shunts: No atrial level shunt detected by color flow Doppler.   Laboratory Data:  High Sensitivity Troponin:   Recent Labs  Lab 11/19/23 1359  TROPONINIHS 161*     Chemistry Recent Labs  Lab 11/19/23 1359 11/19/23 1436  NA 132* 133*  K 5.2* 5.3*  CL 95* 96*  CO2 25  --   GLUCOSE 129* 125*  BUN 43* 41*  CREATININE 6.39* 7.20*  CALCIUM 7.8*  --   GFRNONAA 9*  --   ANIONGAP 12  --     Recent Labs  Lab 11/19/23 1359   PROT 5.8*  ALBUMIN 3.0*  AST 15  ALT 14  ALKPHOS 75  BILITOT 0.6   Lipids No results for input(s): "CHOL", "TRIG", "HDL", "LABVLDL", "LDLCALC", "CHOLHDL" in the last 168 hours.  Hematology Recent Labs  Lab 11/19/23 1359 11/19/23 1436  WBC 5.0  --   RBC 3.12*  --   HGB 9.9* 9.9*  HCT 30.1* 29.0*  MCV 96.5  --   MCH 31.7  --   MCHC 32.9  --   RDW 14.3  --   PLT 146*  --    Thyroid No results for input(s): "TSH", "FREET4" in the last 168 hours.  BNPNo results for input(s): "BNP", "PROBNP" in the last 168 hours.  DDimer No results for input(s): "DDIMER" in the last 168 hours.   Radiology/Studies:  No results found.   Assessment and Plan:   Paroxysmal atrial fibrillation Bradycardia Patient with history of paroxysmal atrial fibrillation (no recent occurrence) was brought in from dialysis today with hypotension and bradycardia. HR in the ED on ECG 46 bpm, sinus rhythm with 1st degree AVB. No evidence of high grade AV block Overall low concern regarding bradycardia, likely exacerbated by home beta blocker. No indication for pacemaker/with significant co-morbidities, patient would likely not be a candidate for a pacemaker. Stop Labetalol (patient taking 200mg  BID). Washout is typically 16-18 hours after last dose. Monitor HR closely on telemetry.  Continue Eliquis and Amiodarone (low association with bradycardia).   CAD Elevated troponin  Patient s/p angioplasty to RCA in 2016. Has since had multiple admissions with chest pain.  Per outpatient notes, conservative management has been discussed. No recurrence of chest pain in recent timeframe.  Troponin elevation is not consistent with ACS, rather demand ischema in patient with ESRD.  No ASA with Eliquis Continue Atorvastatin Resume Imdur this admission as BP allows  Hypertension Patient with hypotension in the setting of dialysis today. PTA med list includes Amlodipine 10mg , Imdur 60mg . Continue holding home  anti-hypertensives with low normal BP.   HFpEF Last echo from 03/31/23 with LVEF 60-65%, grade I DD. 2+ pitting edema in bilateral knee stumps today.  Volume management per HD. Patient states that he is "15L up."  Risk Assessment/Risk Scores:          CHA2DS2-VASc Score = 4   This indicates a 4.8% annual risk of stroke. The patient's score is based upon: CHF History: 0 HTN History: 1 Diabetes History: 1 Stroke History: 0 Vascular Disease History: 1 Age Score: 1 Gender Score: 0         For questions or updates, please contact [facility] Please consult [email or web address] for contact info under    Signed, Perlie Gold, PA-C  11/19/2023 4:19 PM

## 2023-11-19 NOTE — H&P (Cosign Needed)
 Date: 11/19/2023         Patient Name:  [name] MRN: [id number]  DOB: 03-17-1956 Age / Sex: 68 y.o., male   PCP: Lonie Peak, PA-C         Medical Service: Internal Medicine Teaching Service         Attending Physician: Dr. Mercie Eon, MD    First Contact: Dr. Kathleen Lime, MD Pager [phone] Pager: [phone]  Second Contact: Dr. Marrianne Mood, MD Pager [phone] Pager: [phone]       After Hours (After 5p/  First Contact Pager: [phone]  weekends / holidays): Second Contact Pager: [phone]   Chief Concern:   History of Present Illness: [name] is a 68 year old male with a history of end-stage renal disease on hemodialysis Tuesdays, Thursdays and Saturdays, also history of hypertension, COPD, CHF ,PAF on Eliquis who presented to the ED via EMS from his dialysis center due to concerns for fatigue and bradycardia in the 40s.  Patient reports that he was in usual state of health until this morning when he felt his breathing was " funny".  He reports he missed dialysis last Thursday but showed up on Saturday and had his dialysis session.  This morning right before his dialysis session,he  had severe shortness of breath and weakness that he did not have before. The provider at the dialysis center recommended he comes into the hospital after 45 min into his dialysis session due to hypotension in the 70s systolics.He denies any chest pain, palpitations, or shortness of breath.  Upon arriving to the ED, nephrology and cardiology was consulted  was consulted who agreed to see the patient. Dr Valentino Nose agreed to resume his dialysis session tomorrow and IMTS consulted for admission. He is a bit guarded and doesn't give much information. He was on his phone with someone during the entire time this history was being taken.    Allergies: Contrast allergy Latex   Past Medical History: Patient Active Problem List   Diagnosis Date Noted   Bradycardia 11/19/2023   Rash and  nonspecific skin eruption 11/19/2023   C. difficile colitis 09/25/2023   SIRS (systemic inflammatory response syndrome) (HCC) 09/22/2023   Hyperkalemia 07/19/2023   ESRD on dialysis (HCC) 07/19/2023   Severe sepsis (HCC) 05/28/2023   Acute encephalopathy 05/28/2023   Acute respiratory failure with hypoxia (HCC) 05/28/2023   Benign hypertension 04/01/2023   Precordial pain 03/31/2023   History of right coronary artery stent placement 03/31/2023   Current use of long term anticoagulation 03/31/2023   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) 03/31/2023   Paroxysmal A-fib (HCC) 03/31/2023   Hypertensive crisis 03/31/2023   Unstable angina (HCC) 03/30/2023   Tobacco abuse 03/30/2023   Prolonged QT interval 03/30/2023   Elevated troponin 03/30/2023   Cellulitis 11/20/2022   Chest pain 07/04/2022   S/P BKA (below knee amputation) unilateral, left (HCC) 07/04/2022   Subacute osteomyelitis, left ankle and foot (HCC) 07/03/2022   Malaise 01/13/2022   Anemia of renal disease 01/13/2022   Obesity (BMI 30-39.9) 01/13/2022   Decubitus ulcer of buttock 01/13/2022   Intractable nausea and vomiting with epigastric pain 01/11/2022   Chronic osteomyelitis (HCC) 01/11/2022   Proliferative diabetic retinopathy of left eye with macular edema associated with type 2 diabetes mellitus (HCC) 11/27/2021   Vitreous hemorrhage of right eye (HCC) 11/27/2021   Sepsis (HCC) 09/02/2021   Severe sepsis with lactic acidosis (HCC) 06/14/2021   Infection associated with  implanted penile prosthesis (HCC) 06/14/2021   ESRD on hemodialysis with hyperkalemia 06/14/2021   Paroxysmal atrial fibrillation with RVR (HCC) 06/14/2021   Carotid stenosis 06/15/2020   Dyslipidemia 06/15/2020   Diabetic macular edema of right eye with proliferative retinopathy associated with type 2 diabetes mellitus (HCC) 01/18/2020   Severe nonproliferative diabetic retinopathy of left eye, with macular edema,  associated with type 2 diabetes mellitus (HCC) 01/04/2020   Retinal hemorrhage of right eye 01/04/2020   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   Pseudoclaudication 11/15/2018   S/P BKA (below knee amputation), right (HCC) 09/18/2018   Amputation of toe of left foot (HCC) 09/18/2018   Peripheral artery disease (HCC) 09/18/2018   S/P carotid endarterectomy 09/18/2018   OSA not tolerating CPAP 09/18/2018   Chronic back pain 09/18/2018   Status post reversal of ileostomy 05/21/2018   Normocytic anemia 02/14/2018   Intra-abdominal abscess (HCC) 11/04/2017   Chronic venous hypertension (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 (HCC) 12/11/2016   PAD (peripheral artery disease) (HCC) 11/12/2016   Unilateral primary osteoarthritis, left knee 10/11/2016   History of right below knee amputation (HCC) 07/12/2016   Infected diabetic left foot ulcer with chronic osteomyelitis    Diabetic foot infection (HCC) 05/04/2016   Insulin dependent type 2 diabetes mellitus (HCC) 12/16/2015   Amputated toe (HCC) 10/21/2015   Cellulitis of left lower extremity 09/03/2015   Leg edema, left 09/03/2015   CAD (dz of distal, mid and proximal RCA with implantation of 3 overlapping drug-eluting stent, with elevated troponin 09/03/2015   Chronic diastolic heart failure, NYHA class 2 (HCC) 09/03/2015   Hyperlipidemia 08/25/2014   Limb pain 03/20/2013   DJD (degenerative joint disease) 09/25/2012   Migraine 09/25/2012   Neuropathy 09/25/2012   Restless legs syndrome (RLS) 09/25/2012   Chronic obstructive pulmonary disease, unspecified (HCC) 04/25/2012   Unknown cause of morbidity or mortality 04/25/2012   Chronic total occlusion of artery of the extremities (HCC) 04/08/2012   Onychomycosis 02/01/2012   Occlusion and stenosis of carotid artery without mention of cerebral infarction 06/12/2011   Barrett's esophagus without dysplasia 05/08/2011   Former tobacco use 02/01/2011   Past Medical History:   Diagnosis Date   Carotid artery occlusion 11/10/2010   LEFT CAROTID ENDARTERECTOMY   Complication of anesthesia    BP WENT UP AT [facility] "   COPD (chronic obstructive pulmonary disease) (HCC)    pt denies this dx as of 06/01/20 - no inhaler    Diabetes mellitus without complication (HCC)    Diverticulitis    Diverticulosis of colon (without mention of hemorrhage)    DJD (degenerative joint disease)    knees/hands/feet/back/neck   ESRD (end stage renal disease) on dialysis (HCC)    Fatty liver    Full dentures    GERD (gastroesophageal reflux disease)    H/O hiatal hernia    History of blood transfusion    with a past surical procedure per patient 06/01/20   Hyperlipidemia    Hypertension    Neuromuscular disorder (HCC)    peripheral neuropathy   Non-pressure chronic ulcer of other part of left foot limited to breakdown of skin (HCC) 11/12/2016   Osteomyelitis (HCC)    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 Atherectomy left popliteal artery July 2012.    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   Unstable angina (HCC) 09/16/2018   Wears glasses      Medications:  Current Facility-Administered Medications:    acetaminophen (TYLENOL) tablet 650 mg, 650 mg, Oral, Q6H, Marrianne Mood, MD, 650 mg at 11/19/23 1852   [START ON 11/20/2023] amiodarone (PACERONE) tablet 200 mg, 200 mg, Oral, Daily, Marrianne Mood, MD   apixaban (ELIQUIS) tablet 5 mg, 5 mg, Oral, BID, Marrianne Mood, MD   atorvastatin (LIPITOR) tablet 40 mg, 40 mg, Oral, QHS, Marrianne Mood, MD   Melene Muller ON 11/20/2023] Chlorhexidine Gluconate Cloth 2 % PADS 6 each, 6 each, Topical, Q0600, Tomasa Blase, PA-C   [START ON 11/21/2023] famotidine (PEPCID) tablet 20 mg, 20 mg, Oral, Garnette Gunner, MD   gabapentin (NEURONTIN)  capsule 100 mg, 100 mg, Oral, TID, Marrianne Mood, MD   Melene Muller ON 11/20/2023] insulin aspart (novoLOG) injection 0-20 Units, 0-20 Units, Subcutaneous, TID WC, Marrianne Mood, MD   oxyCODONE-acetaminophen (PERCOCET/ROXICET) 5-325 MG per tablet 1 tablet, 1 tablet, Oral, Q8H PRN, Marrianne Mood, MD  Current Outpatient Medications:    amiodarone (PACERONE) 200 MG tablet, Take 1 tablet (200 mg total) by mouth daily. (Patient taking differently: Take 200 mg by mouth in the morning.), Disp: 100 tablet, Rfl: 1   amLODipine (NORVASC) 10 MG tablet, Take 10 mg by mouth in the morning., Disp: , Rfl:    apixaban (ELIQUIS) 5 MG TABS tablet, Take 1 tablet (5 mg total) by mouth 2 (two) times daily., Disp: 60 tablet, Rfl: 1   atorvastatin (LIPITOR) 40 MG tablet, Take 40 mg by mouth at bedtime. , Disp: , Rfl:    B Complex-C-Zn-Folic Acid (DIALYVITE/ZINC) TABS, Take 1 tablet by mouth Every Tuesday,Thursday,and Saturday with dialysis., Disp: , Rfl:    famotidine (PEPCID) 20 MG tablet, Take 20 mg by mouth every other day., Disp: , Rfl:    fentaNYL (DURAGESIC) 50 MCG/HR, 1 patch every 3 (three) days., Disp: , Rfl:    folic acid (FOLVITE) 1 MG tablet, Take 1 mg by mouth in the morning., Disp: , Rfl:    gabapentin (NEURONTIN) 100 MG capsule, Take 100 mg by mouth 3 (three) times daily., Disp: , Rfl:    hydrALAZINE (APRESOLINE) 50 MG tablet, Take 50 mg by mouth every 8 (eight) hours., Disp: , Rfl:    Icosapent Ethyl 0.5 g CAPS, Take 1 capsule by mouth in the morning., Disp: , Rfl:    insulin aspart (NOVOLOG FLEXPEN) 100 UNIT/ML FlexPen, Inject 0-12 Units into the skin See admin instructions. Inject 4 units into the skin with meals in addition to sliding scale as needed. Inject as per sliding scale: If 0 - 69 = 0 initiate hypoglycemia orders;  70 - 150 = 0;  151 - 200 = 2;  201 - 250 = 4;  251 - 300 = 6;  301 - 350 = 8;  351 - 400 = 10;  401 - 450 = 12.  If >400, give 12 units, recheck in 1 hour. If still >400, call  MD/NP for further orders. Subcutaneously with meals in addition to scheduled 4 unites with meals., Disp: , Rfl:    insulin aspart (NOVOLOG) 100 UNIT/ML injection, Inject 4 Units into the skin 3 (three) times daily with meals., Disp: , Rfl:    isosorbide mononitrate (IMDUR) 60 MG 24 hr tablet, Take 1 tablet (60 mg total) by mouth daily. (Patient taking differently: Take 60 mg by mouth in the morning.), Disp: 90 tablet,  Rfl: 1   labetalol (NORMODYNE) 200 MG tablet, Take 1 tablet (200 mg total) by mouth 2 (two) times daily., Disp: , Rfl:    lidocaine-prilocaine (EMLA) cream, Apply 1 application topically See admin instructions. Prior to Dialysis days Tuesday,Thursday and saturday, Disp: , Rfl:    liver oil-zinc oxide (DESITIN) 40 % ointment, Apply 1 Application topically as needed for irritation., Disp: , Rfl:    loratadine (CLARITIN) 10 MG tablet, Take 10 mg by mouth in the morning., Disp: , Rfl:    midodrine (PROAMATINE) 10 MG tablet, Take 10 mg by mouth daily as needed (SBP less than 100.)., Disp: , Rfl:    mineral oil-hydrophilic petrolatum (AQUAPHOR) ointment, Apply 1 Application topically as needed for dry skin or irritation (Upper thigh)., Disp: , Rfl:    nitroGLYCERIN (NITROSTAT) 0.4 MG SL tablet, Place 0.4 mg under the tongue every 5 (five) minutes as needed for chest pain., Disp: , Rfl:    oxyCODONE-acetaminophen (PERCOCET/ROXICET) 5-325 MG tablet, Take 1 tablet by mouth every 8 (eight) hours as needed for severe pain., Disp: 15 tablet, Rfl: 0   promethazine (PHENERGAN) 12.5 MG suppository, Place 12.5 mg rectally daily as needed for nausea or vomiting., Disp: , Rfl:    rOPINIRole (REQUIP) 2 MG tablet, Take 2 mg by mouth at bedtime., Disp: , Rfl:    tamsulosin (FLOMAX) 0.4 MG CAPS capsule, Take 0.4 mg by mouth in the morning., Disp: , Rfl:    Surgical History: Past Surgical History:  Procedure Laterality Date   ABDOMINAL AORTOGRAM W/LOWER EXTREMITY N/A 06/23/2021   Procedure: ABDOMINAL  AORTOGRAM W/LOWER EXTREMITY;  Surgeon: Elder Negus, MD;  [facility]: [facility];  Service: Cardiovascular;  Laterality: N/A;   AMPUTATION  11/05/2011   Procedure: AMPUTATION RAY;  Surgeon: Toni Arthurs, MD;  Location: [facility];  Service: Orthopedics;  Laterality: Right;  Amputation of Right 4&5th Toes   AMPUTATION Left 11/26/2012   Procedure: AMPUTATION RAY;  Surgeon: Toni Arthurs, MD;  Location: [facility];  Service: Orthopedics;  Laterality: Left;  fourth ray amputation   AMPUTATION Right 08/27/2014   Procedure: Transmetatarsal Amputation;  Surgeon: Nadara Mustard, MD;  Location[facility];  Service: Orthopedics;  Laterality: Right;   AMPUTATION Right 01/14/2015   Procedure: AMPUTATION BELOW KNEE;  Surgeon: Nadara Mustard, MD;  Location: [facility] OR;  Service: Orthopedics;  Laterality: Right;   AMPUTATION Left 10/21/2015   Procedure: Left Foot 5th Ray Amputation;  Surgeon: Nadara Mustard, MD;  Location: [facility] OR;  Service: Orthopedics;  Laterality: Left;   AMPUTATION Left 07/04/2022   Procedure: LEFT BELOW KNEE AMPUTATION;  Surgeon: Nadara Mustard, MD;  Location: [facility] OR;  Service: Orthopedics;  Laterality: Left;   ANTERIOR FUSION CERVICAL SPINE  02/06/06   C4-5, C5-6, C6-7; SURGEON DR. MAX COHEN   AV FISTULA PLACEMENT Left 06/02/2020   Procedure: ARTERIOVENOUS (AV) FISTULA CREATION LEFT;  Surgeon: Maeola Harman, MD;  Location: [facility] OR;  Service: Vascular;  Laterality: Left;   BACK SURGERY     x 3   BASCILIC VEIN TRANSPOSITION Left 07/21/2020   Procedure: LEFT UPPER ARM ATERIOVENOUS SUPERFISTULALIZATION;  Surgeon: Maeola Harman, MD;  Location: [facility] OR;  Service: Vascular;  Laterality: Left;   BELOW KNEE LEG AMPUTATION Right    CARDIAC CATHETERIZATION  10/31/04   2009   CAROTID ENDARTERECTOMY  11/10/10   CAROTID ENDARTERECTOMY Left 11/10/2010   Subtotal occlusion of left internal carotid artery with left hemispheric transient ischemic attacks.   CAROTID STENT  CARPAL TUNNEL RELEASE Right  10/21/2013   Procedure: RIGHT CARPAL TUNNEL RELEASE;  Surgeon: Nicki Reaper, MD;  Location: [facility];  Service: Orthopedics;  Laterality: Right;   CHOLECYSTECTOMY     COLON SURGERY     COLONOSCOPY     COLOSTOMY REVERSAL  05/21/2018   ileostomy reversal   CYSTOSCOPY WITH STENT PLACEMENT Bilateral 01/13/2018   Procedure: CYSTOSCOPY WITH BILATERAL URETERAL CATHETER PLACEMENT;  Surgeon: Crist Fat, MD;  Location: [facility];  Service: Urology;  Laterality: Bilateral;   ESOPHAGEAL MANOMETRY Bilateral 07/19/2014   Procedure: ESOPHAGEAL MANOMETRY (EM);  Surgeon: Beverley Fiedler, MD;  Location: [facility];  Service: Gastroenterology;  Laterality: Bilateral;   EYE SURGERY Bilateral 2020   cataract   FEMORAL ARTERY STENT     x6   FINGER SURGERY     FOOT SURGERY  04/25/2016    EXCISION BASE 5TH METATARSAL AND PARTIAL CUBOID LEFT FOOT   HERNIA REPAIR     LEFT INGUINAL AND UMBILICAL REPAIRS   HERNIA REPAIR     I & D EXTREMITY Left 04/25/2016   Procedure: EXCISION BASE 5TH METATARSAL AND PARTIAL CUBOID LEFT FOOT;  Surgeon: Nadara Mustard, MD;  Location: [facility];  Service: Orthopedics;  Laterality: Left;   ILEOSTOMY  01/13/2018   Procedure: ILEOSTOMY;  Surgeon: Berna Bue, MD;  Location: [facility];  Service: General;;   ILEOSTOMY CLOSURE N/A 05/21/2018   Procedure: ILEOSTOMY REVERSAL ERAS PATHWAY;  Surgeon: Berna Bue, MD;  Location: [facility];  Service: General;  Laterality: N/A;   IR RADIOLOGIST EVAL & MGMT  11/19/2017   IR RADIOLOGIST EVAL & MGMT  12/03/2017   IR RADIOLOGIST EVAL & MGMT  12/18/2017   JOINT REPLACEMENT Right 2001   Total knee   LAMINECTOMY     X 3 LUMBAR AND X 2 CERVICAL SPINE OPERATIONS   LAPAROSCOPIC CHOLECYSTECTOMY W/ CHOLANGIOGRAPHY  11/09/04   SURGEON DR. Caleen Essex   LEFT HEART CATH AND CORONARY ANGIOGRAPHY N/A 09/16/2018   Procedure: LEFT HEART CATH AND CORONARY ANGIOGRAPHY;  Surgeon: Elder Negus, MD;  [facility];  Service:  Cardiovascular;  Laterality: N/A;   LEFT HEART CATHETERIZATION WITH CORONARY ANGIOGRAM N/A 10/29/2014   Procedure: LEFT HEART CATHETERIZATION WITH CORONARY ANGIOGRAM;  Surgeon: Pamella Pert, MD;  Location: [facility] CATH LAB;  Service: Cardiovascular;  Laterality: N/A;   LIGATION OF COMPETING BRANCHES OF ARTERIOVENOUS FISTULA Left 07/21/2020   Procedure: LIGATION OF COMPETING BRANCHES OF LEFT UPPER ARM ARTERIOVENOUS FISTULA;  Surgeon: Maeola Harman, MD;  Location: [facility] OR;  Service: Vascular;  Laterality: Left;   LOWER EXTREMITY ANGIOGRAM N/A 03/19/2012   Procedure: LOWER EXTREMITY ANGIOGRAM;  Surgeon: Kathleene Hazel, MD;  Location: [facility] CATH LAB;  Service: Cardiovascular;  Laterality: N/A;   LOWER EXTREMITY ANGIOGRAPHY N/A 06/20/2021   Procedure: LOWER EXTREMITY ANGIOGRAPHY;  Surgeon: Elder Negus, MD;  [facility];  Service: Cardiovascular;  Laterality: N/A;   NECK SURGERY     PARTIAL COLECTOMY N/A 01/13/2018   Procedure: LAPAROSCOPIC ASSISTED   SIGMOID COLECTOMY ILEOSTOMY;  Surgeon: Berna Bue, MD;  Location: [facility];  Service: General;  Laterality: N/A;   PENILE PROSTHESIS IMPLANT  08/14/05   INFRAPUBIC INSERTION OF INFLATABLE PENILE PROSTHESIS; SURGEON DR. Logan Bores   PENILE PROSTHESIS IMPLANT     PERCUTANEOUS CORONARY STENT INTERVENTION (PCI-S) Right 10/29/2014   Procedure: PERCUTANEOUS CORONARY STENT INTERVENTION (PCI-S);  Surgeon: Pamella Pert, MD;  Location: [facility] CATH  LAB;  Service: Cardiovascular;  Laterality: Right;   PERIPHERAL VASCULAR INTERVENTION Left 06/23/2021   Procedure: PERIPHERAL VASCULAR INTERVENTION;  Surgeon: Elder Negus, MD;  [facility]: [facility];  Service: Cardiovascular;  Laterality: Left;   REMOVAL OF PENILE PROSTHESIS N/A 06/14/2021   Procedure: Removal of THREE piece inflatable penile prosthesis;  Surgeon: Crista Elliot, MD;  Location: [facility] OR;  Service: Urology;  Laterality: N/A;   SHOULDER ARTHROSCOPY      SPINE SURGERY     TOE AMPUTATION Left    TONSILLECTOMY     TOTAL KNEE ARTHROPLASTY  07/2002   RIGHT KNEE ; SURGEON  DR. [name] ALSO HAD ARTHROSCOPIC RIGHT KNEE IN  10/2001   TOTAL KNEE ARTHROPLASTY     ULNAR NERVE TRANSPOSITION Right 10/21/2013   Procedure: RIGHT ELBOW  ULNAR NERVE DECOMPRESSION;  Surgeon: Nicki Reaper, MD;  Location: [facility];  Service: Orthopedics;  Laterality: Right;    Family History:  Family History  Problem Relation Age of Onset   Heart disease Father        Before age 54-  CAD, BPG   Diabetes Father        Amputation   Cancer Father        PROSTATE   Hyperlipidemia Father    Hypertension Father    Heart attack Father        Triple BPG   Varicose Veins Father    Cancer Sister        Breast   Hyperlipidemia Sister    Hypertension Sister    Heart attack Brother    Colon cancer Brother    Diabetes Brother    Heart disease Brother 73       A-Fib. Before age 7   Hyperlipidemia Brother    Hypertension Brother    Hypertension Son    Arthritis Other        GRANDMOTHER   Hypertension Other        OTHER FAMILY MEMBERS    Social History:  Social History   Socioeconomic History   Marital status: Married    Spouse name: Not on file   Number of children: 3   Years of education: Not on file   Highest education level: Not on file  Occupational History   Occupation: TRUCK DRIVER    Employer: UNEMPLOYED  Tobacco Use   Smoking status: Every Day    Current packs/day: 0.50    Average packs/day: 0.5 packs/day for 35.0 years (17.5 ttl pk-yrs)    Types: Cigarettes   Smokeless tobacco: Never  Vaping Use   Vaping status: Every Day   Substances: Nicotine  Substance and Sexual Activity   Alcohol use: Not Currently    Comment: "not in a long time"   Drug use: Never   Sexual activity: Yes    Birth control/protection: Implant    Comment: penile implant  Other Topics Concern   Not on file  Social History Narrative   ** Merged History  Encounter **       HEAVY SMOKER AND CONTINUES TO SMOKE 1 PPD. DOES NOT EXERCISE REGULARLY.  Wife [phone] Otho Najjar). Has 2 sons and daughter. Still active       Social Drivers of Corporate investment banker Strain: Not on file  Food Insecurity: No Food Insecurity (11/19/2023)   Hunger Vital Sign    Worried About Running Out of Food in the Last Year: Never true    Ran Out of Food  in the Last Year: Never true  Transportation Needs: No Transportation Needs (11/19/2023)   PRAPARE - Administrator, Civil Service (Medical): No    Lack of Transportation (Non-Medical): No  Physical Activity: Not on file  Stress: Not on file  Social Connections: Moderately Isolated (11/19/2023)   Social Connection and Isolation Panel [NHANES]    Frequency of Communication with Friends and Family: More than three times a week    Frequency of Social Gatherings with Friends and Family: More than three times a week    Attends Religious Services: Never    Database administrator or Organizations: No    Attends Banker Meetings: Never    Marital Status: Married  Catering manager Violence: Not At Risk (11/19/2023)   Humiliation, Afraid, Rape, and Kick questionnaire    Fear of Current or Ex-Partner: No    Emotionally Abused: No    Physically Abused: No    Sexually Abused: No     Physical Exam:  Blood pressure 131/61, pulse (!) 44, temperature 98.1 F (36.7 C), resp. rate 17, SpO2 98%.  Constitutional: well-appearing man, sitting in bed, in no acute distress HENT: normocephalic atraumatic, mucous membranes moist Cardiovascular:  Elevated JVD Pulmonary/Chest: Crackles appreciated  Abdominal: soft, non-tender, non-distended. .no fluid wave  Neurological: alert & oriented x 3 MSK: Bilateral BKA . Amputation site looks C/D/I Skin: warm and dry Psych: Normal mood and affect   EKG:  Sinus bradycardia  Labs:    Latest Ref Rng & Units 11/19/2023    2:36 PM 11/19/2023     1:59 PM 09/26/2023    3:20 AM  CBC  WBC 4.0 - 10.5 K/uL  5.0  6.7   Hemoglobin 13.0 - 17.0 g/dL 9.9  9.9  16.1   Hematocrit 39.0 - 52.0 % 29.0  30.1  28.6   Platelets 150 - 400 K/uL  146  192      Images and other studies:  Imaging: DG Chest Port 1 View Result Date: 11/19/2023 CLINICAL DATA:  Shortness of breath and chest pain. EXAM: PORTABLE CHEST 1 VIEW COMPARISON:  09/22/2023 FINDINGS: Mild cardiomegaly. Aortic atherosclerosis. Stable mediastinal contours. Patchy airspace disease in the lung bases is increased from prior exam. No pulmonary edema, pneumothorax or large pleural effusion. Lower cervical spine hardware partially included. IMPRESSION: 1. Patchy airspace disease in the lung bases, increased from prior exam. This may represent atelectasis or pneumonia. 2. Mild cardiomegaly. Electronically Signed   By: Narda Rutherford M.D.   On: 11/19/2023 16:30      Assessment & Plan:  VIVAN AGOSTINO is a 68 y.o.  very poor health status including ESRD on dialysis, chronic diastolic heart failure, bilateral BKA's, presents from dialysis with weakness and was found to be bradycardic and hypotensive.  Principal Problem:   Bradycardia Active Problems:   Chronic diastolic heart failure, NYHA class 2 (HCC)   Insulin dependent type 2 diabetes mellitus (HCC)   Malaise   Decubitus ulcer of buttock   Chest pain   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)   Paroxysmal A-fib (HCC)   Benign hypertension   ESRD on dialysis (HCC)   Rash and nonspecific skin eruption    #Bradycardia At the time of my assessment he is stable with rates in the 40s.  He feels weak and worn out but his blood pressure is okay.  EKG looks like sinus bradycardia with first-degree AV block.  He is on some AV nodal agents  which we will hold overnight.  No indication for atropine right now.  This bradycardia is not from ACS.  Monitor on cardiac telemetry floor.  Appreciate cardiology's input. - Hold  labetalol - Restart amiodarone tomorrow  #Chest pain Atypical for angina, feels like tightness on left side of chest, does not radiate, somewhat reproducible to palpation there.  Is not worse with exertion.  EKG is reassuring against ischemia and his troponins are elevated but flat, reassuring against MI in this person with ESRD.  Continue supportive care.  #Malaise Symptoms started at dialysis, I cannot attribute all of his symptoms to his bradycardia because his blood pressure is better now but he still feels really washed out.  He has a rash over his fistula site.  I do worry about a bacteremia in this person.  I am going to get blood cultures but will defer starting antibiotics for now.  #ESRD On dialysis Tuesday Thursday and Saturday.  Acknowledges that he sometimes misses sessions because he feels poorly.  He knows that he is volume up now.  He looks volume overloaded with JVD to earlobes while upright.  Respiratory status is stable.  Electrolytes are okay.  Nephrology is following and he will be dialyzed as soon as they are busy schedule allows.  #Chronic diastolic heart failure Stable, does not appear to be in a low output state.  Volume management per HD.  #Paroxysmal A-fib In sinus bradycardia now.  Will continue Eliquis for anticoagulation.  Restart amio tomorrow.  #Bilateral BKA In setting of PAD and diabetes.  #Hypertension Holding antihypertensives for now.   #Rash Over fistula.  Well-demarcated.  Apparently he is using lidocaine patches over this area.  It could be irritant contact dermatitis, blood cultures per above, defer antibiotics.  #Decubitus ulcer of buttock Looks like stage II, present on admission.   Level of care: Telemetry cardiac Diet: Renal diet VTE: Eliquis Code: Full code  Signed: Kathleen Lime, MD 11/19/2023, 8:37 PM

## 2023-11-19 NOTE — ED Provider Notes (Signed)
 [place] EMERGENCY DEPARTMENT AT [facility] Provider Note   CSN: [id number] Arrival date & time: 11/19/23  1347     History  No chief complaint on file.   [name] is a 68 y.o. male.  68 year old male with medical history of ESRD on dialysis, hypertension, insulin-dependent diabetes type 2, COPD, chronic pain syndrome, peripheral artery disease status post bilateral BKA, coronary artery disease, paroxysmal A-fib on Eliquis presents with complaint of fatigue.  Patient was at dialysis.  He completed approximately 45 minutes of dialysis prior to being sent to the ED for evaluation secondary to fatigue and heart rate into the 40s.  Patient denies chest pain or shortness of breath.  He reports that he started to feel weak yesterday.  He presented as per his normal for his Tuesday dialysis.  He is typically on a Tuesday, Thursday, Saturday schedule.  His last dialysis session was on Saturday.  Dialysis reported that the patient became hypotensive during dialysis today.  Blood pressure systolic was in the 70s.  Patient reportedly is at least 15 L overloaded.  Only 1 L was taken off today.  The history is provided by the patient and medical records.       Home Medications Prior to Admission medications   Medication Sig Start Date End Date Taking? Authorizing Provider  amiodarone (PACERONE) 200 MG tablet Take 1 tablet (200 mg total) by mouth daily. 10/29/22   Yates Decamp, MD  amLODipine (NORVASC) 10 MG tablet Take 10 mg by mouth daily.    [provider]  apixaban (ELIQUIS) 5 MG TABS tablet Take 1 tablet (5 mg total) by mouth 2 (two) times daily. 01/14/22 03/09/24  Almon Hercules, MD  atorvastatin (LIPITOR) 40 MG tablet Take 40 mg by mouth at bedtime.     [provider]  B Complex-C-Zn-Folic Acid (DIALYVITE/ZINC) TABS Take 1 tablet by mouth Every Tuesday,Thursday,and Saturday with dialysis. 10/04/21   [provider]  famotidine (PEPCID) 20 MG tablet  Take 20 mg by mouth every other day.    [provider]  fentaNYL (DURAGESIC) 50 MCG/HR 1 patch every 3 (three) days. 07/25/23   [provider]  folic acid (FOLVITE) 1 MG tablet Take 1 mg by mouth daily. 09/18/21   [provider]  gabapentin (NEURONTIN) 100 MG capsule Take 100 mg by mouth 3 (three) times daily.    [provider]  hydrALAZINE (APRESOLINE) 50 MG tablet Take 1 tablet (50 mg total) by mouth every 8 (eight) hours. Patient taking differently: Take 50 mg by mouth 4 (four) times daily. 04/01/23   [name], MD  Icosapent Ethyl 0.5 g CAPS Take 1 capsule by mouth daily.    [provider]  insulin aspart (NOVOLOG FLEXPEN) 100 UNIT/ML FlexPen Inject 0-12 Units into the skin See admin instructions. Inject 4 units into the skin with meals in addition to sliding scale as needed. Inject as per sliding scale: If 0 - 69 = 0 initiate hypoglycemia orders;  70 - 150 = 0;  151 - 200 = 2;  201 - 250 = 4;  251 - 300 = 6;  301 - 350 = 8;  351 - 400 = 10;  401 - 450 = 12.  If >400, give 12 units, recheck in 1 hour. If still >400, call MD/NP for further orders. Subcutaneously with meals in addition to scheduled 4 unites with meals.    [provider]  insulin aspart (NOVOLOG) 100 UNIT/ML injection Inject  4 Units into the skin 3 (three) times daily with meals.    [provider]  isosorbide mononitrate (IMDUR) 60 MG 24 hr tablet Take 1 tablet (60 mg total) by mouth daily. 01/21/23 01/08/24  Yates Decamp, MD  labetalol (NORMODYNE) 200 MG tablet Take 1 tablet (200 mg total) by mouth 2 (two) times daily. 04/01/23   [name], MD  lidocaine-prilocaine (EMLA) cream Apply 1 application topically See admin instructions. Prior to Dialysis days Tuesday,Thursday and saturday 06/06/21   [provider]  loratadine (CLARITIN) 10 MG tablet Take 10 mg by mouth daily.    [provider]  midodrine (PROAMATINE) 10 MG tablet Take 10 mg  by mouth daily as needed (SBP less than 100.).    [provider]  mineral oil-hydrophilic petrolatum (AQUAPHOR) ointment Apply 1 Application topically as needed for dry skin or irritation (Upper thigh).    [provider]  nitroGLYCERIN (NITROSTAT) 0.4 MG SL tablet Place 0.4 mg under the tongue every 5 (five) minutes as needed for chest pain. 01/06/22   [provider]  Nystatin ([name]'S BUTT CREAM) CREA Apply 1 Application topically 3 (three) times daily as needed for irritation. Apply to irritated area on sacrum    [provider]  nystatin cream (MYCOSTATIN) Apply 1 Application topically daily. Apply to  upper thigh    [provider]  ondansetron (ZOFRAN) 4 MG tablet Take 1 tablet (4 mg total) by mouth every 8 (eight) hours as needed for nausea or vomiting. 04/24/23   Yates Decamp, MD  oxyCODONE-acetaminophen (PERCOCET/ROXICET) 5-325 MG tablet Take 1 tablet by mouth every 8 (eight) hours as needed for severe pain. 05/31/23   Ghimire, Werner Lean, MD  promethazine (PHENERGAN) 12.5 MG suppository Place 12.5 mg rectally daily as needed for nausea or vomiting.    [provider]  rOPINIRole (REQUIP) 2 MG tablet Take 2 mg by mouth at bedtime.    [provider]  tamsulosin (FLOMAX) 0.4 MG CAPS capsule Take 0.4 mg by mouth daily.    [provider]  trolamine salicylate (ASPERCREME) 10 % cream Apply 1 Application topically as needed for muscle pain. Apply to dialysis shunt/port as needed for pain/    [provider]      Allergies    Contrast media [iodinated contrast media], Ivp dye [iodinated contrast media], Latex, and Tape    Review of Systems   Review of Systems  All other systems reviewed and are negative.   Physical Exam Updated Vital Signs Temp 98.8 F (37.1 C) (Oral)  Physical Exam Vitals and nursing note reviewed.  Constitutional:      General: He is not in acute distress.    Appearance: Normal  appearance. He is well-developed.  HENT:     Head: Normocephalic and atraumatic.  Eyes:     Conjunctiva/sclera: Conjunctivae normal.     Pupils: Pupils are equal, round, and reactive to light.  Cardiovascular:     Rate and Rhythm: Regular rhythm. Bradycardia present.     Heart sounds: Normal heart sounds.  Pulmonary:     Effort: Pulmonary effort is normal. No respiratory distress.     Breath sounds: Normal breath sounds.  Abdominal:     General: There is no distension.     Palpations: Abdomen is soft.     Tenderness: There is no abdominal tenderness.  Musculoskeletal:        General: No deformity. Normal range of motion.     Cervical back: Normal range  of motion and neck supple.     Comments: Status post bilateral BKA  Skin:    General: Skin is warm and dry.  Neurological:     General: No focal deficit present.     Mental Status: He is alert and oriented to person, place, and time.     ED Results / Procedures / Treatments   Labs (all labs ordered are listed, but only abnormal results are displayed) Labs Reviewed  CBC WITH DIFFERENTIAL/PLATELET - Abnormal; Notable for the following components:      Result Value   RBC 3.12 (*)    Hemoglobin 9.9 (*)    HCT 30.1 (*)    Platelets 146 (*)    All other components within normal limits  I-STAT CHEM 8, ED - Abnormal; Notable for the following components:   Sodium 133 (*)    Potassium 5.3 (*)    Chloride 96 (*)    BUN 41 (*)    Creatinine, Ser 7.20 (*)    Glucose, Bld 125 (*)    Calcium, Ion 0.94 (*)    Hemoglobin 9.9 (*)    HCT 29.0 (*)    All other components within normal limits  COMPREHENSIVE METABOLIC PANEL  LIPASE, BLOOD  I-STAT CG4 LACTIC ACID, ED  TROPONIN I (HIGH SENSITIVITY)    EKG None  Radiology No results found.  Procedures Procedures    Medications Ordered in ED Medications - No data to display  ED Course/ Medical Decision Making/ A&P                                 Medical Decision  Making Amount and/or Complexity of Data Reviewed Labs: ordered. Radiology: ordered.    Medical Screen Complete  This patient presented to the ED with complaint of weakness, bradycardia.  This complaint involves an extensive number of treatment options. The initial differential diagnosis includes, but is not limited to, metabolic abnormality, arrhythmia, etc.  This presentation is: Acute, Chronic, Self-Limited, Previously Undiagnosed, Uncertain Prognosis, Complicated, Systemic Symptoms, and Threat to Life/Bodily Function  Patient with multiple comorbidities including ESRD on HD presents with weakness, bradycardia into the 40's.  Patient known to Dr. Jacinto Halim with cardiology. Cardiology aware of case and will evaluate.   Screening labs ordered.  Screening imaging ordered.  Patient appears to be comfortable on initial evaluation with blood pressure in the 120s to 130s systolic.  Patient will require admission.  Oncoming EDP aware of pending studies.      Co morbidities that complicated the patient's evaluation  See HPI   Additional history obtained: External records from outside sources obtained and reviewed including prior ED visits and prior Inpatient records.    Lab Tests:  I ordered and personally interpreted labs.  The pertinent results include:  CBC CMP lipase  Istat chem8 trop   Problem List / ED Course:  Missed dialysis, bradycardia, weakness   Disposition:  After consideration of the diagnostic results and the patients response to treatment, I feel that the patent would benefit from completion of ED evaluation.   CRITICAL CARE Performed by: Wynetta Fines   Total critical care time: 30 minutes  Critical care time was exclusive of separately billable procedures and treating other patients.  Critical care was necessary to treat or prevent imminent or life-threatening deterioration.  Critical care was time spent personally by me on the following  activities: development of treatment plan with patient and/or surrogate  as well as nursing, discussions with consultants, evaluation of patient's response to treatment, examination of patient, obtaining history from patient or surrogate, ordering and performing treatments and interventions, ordering and review of laboratory studies, ordering and review of radiographic studies, pulse oximetry and re-evaluation of patient's condition.          Final Clinical Impression(s) / ED Diagnoses Final diagnoses:  Bradycardia    Rx / DC Orders ED Discharge Orders     None         Wynetta Fines, MD 11/19/23 1554

## 2023-11-19 NOTE — ED Triage Notes (Signed)
 Pt BIBA from dialysis w/ c/o hypotension from facility. 45 mins into dialysis pt became hypotensive w/ BP in 70's systolic. Denies D/V/N, SOB, Chest pain. Pt has a LAD blockage and dialysis stated that he is 15L overloaded and only got 1L off today.

## 2023-11-20 DIAGNOSIS — N186 End stage renal disease: Secondary | ICD-10-CM | POA: Diagnosis not present

## 2023-11-20 DIAGNOSIS — R001 Bradycardia, unspecified: Secondary | ICD-10-CM

## 2023-11-20 DIAGNOSIS — Z992 Dependence on renal dialysis: Secondary | ICD-10-CM | POA: Diagnosis not present

## 2023-11-20 DIAGNOSIS — Z743 Need for continuous supervision: Secondary | ICD-10-CM | POA: Diagnosis not present

## 2023-11-20 DIAGNOSIS — E8779 Other fluid overload: Secondary | ICD-10-CM

## 2023-11-20 DIAGNOSIS — E875 Hyperkalemia: Secondary | ICD-10-CM | POA: Diagnosis not present

## 2023-11-20 DIAGNOSIS — I10 Essential (primary) hypertension: Secondary | ICD-10-CM | POA: Diagnosis not present

## 2023-11-20 DIAGNOSIS — I499 Cardiac arrhythmia, unspecified: Secondary | ICD-10-CM | POA: Diagnosis not present

## 2023-11-20 DIAGNOSIS — E8770 Fluid overload, unspecified: Secondary | ICD-10-CM

## 2023-11-20 DIAGNOSIS — Z7401 Bed confinement status: Secondary | ICD-10-CM | POA: Diagnosis not present

## 2023-11-20 LAB — BASIC METABOLIC PANEL
Anion gap: 12 (ref 5–15)
BUN: 55 mg/dL — ABNORMAL HIGH (ref 8–23)
CO2: 26 mmol/L (ref 22–32)
Calcium: 7.9 mg/dL — ABNORMAL LOW (ref 8.9–10.3)
Chloride: 96 mmol/L — ABNORMAL LOW (ref 98–111)
Creatinine, Ser: 7.13 mg/dL — ABNORMAL HIGH (ref 0.61–1.24)
GFR, Estimated: 8 mL/min — ABNORMAL LOW (ref >60)
Glucose, Bld: 120 mg/dL — ABNORMAL HIGH (ref 70–99)
Potassium: 5.6 mmol/L — ABNORMAL HIGH (ref 3.5–5.1)
Sodium: 134 mmol/L — ABNORMAL LOW (ref 135–145)

## 2023-11-20 LAB — GLUCOSE, CAPILLARY: Glucose-Capillary: 161 mg/dL — ABNORMAL HIGH (ref 70–99)

## 2023-11-20 LAB — HEPATITIS B SURFACE ANTIBODY, QUANTITATIVE: Hep B S AB Quant (Post): 15.1 m[IU]/mL

## 2023-11-20 MED ORDER — ANTICOAGULANT SODIUM CITRATE 4% (200MG/5ML) IV SOLN: 5.0000 mL | Status: DC | PRN

## 2023-11-20 MED ORDER — GERHARDT'S BUTT CREAM
TOPICAL_CREAM | Freq: Two times a day (BID) | CUTANEOUS | Status: DC
  Filled 2023-11-20: qty 60

## 2023-11-20 MED ORDER — ALTEPLASE 2 MG IJ SOLR: 2.0000 mg | Freq: Once | INTRAMUSCULAR | Status: DC | PRN

## 2023-11-20 MED ORDER — HEPARIN SODIUM (PORCINE) 1000 UNIT/ML DIALYSIS: 1000.0000 [IU] | INTRAMUSCULAR | Status: DC | PRN

## 2023-11-20 MED ORDER — PENTAFLUOROPROP-TETRAFLUOROETH EX AERO: 1.0000 | INHALATION_SPRAY | CUTANEOUS | Status: DC | PRN

## 2023-11-20 MED ORDER — LIDOCAINE HCL (PF) 1 % IJ SOLN: 5.0000 mL | INTRAMUSCULAR | Status: DC | PRN

## 2023-11-20 MED ORDER — ALBUMIN HUMAN 25 % IV SOLN
25.0000 g | Freq: Once | INTRAVENOUS | Status: AC | PRN
  Administered 2023-11-20: 25 g via INTRAVENOUS

## 2023-11-20 MED ORDER — MIDODRINE HCL 5 MG PO TABS
10.0000 mg | ORAL_TABLET | Freq: Once | ORAL | Status: AC | PRN
  Administered 2023-11-20: 10 mg via ORAL
  Filled 2023-11-20: qty 2

## 2023-11-20 MED ORDER — LIDOCAINE-PRILOCAINE 2.5-2.5 % EX CREA: 1.0000 | TOPICAL_CREAM | CUTANEOUS | Status: DC | PRN

## 2023-11-20 NOTE — Care Management Obs Status (Signed)
 MEDICARE OBSERVATION STATUS NOTIFICATION   Patient Details  Name: [name] MRN: [id number] Date of Birth: [birth year]   Medicare Observation Status Notification Given:  Yes    Oletta Cohn, RN 11/20/2023, 8:46 AM

## 2023-11-20 NOTE — Procedures (Signed)
 I was present at this dialysis session. I have reviewed the session itself and made appropriate changes.   There were no vitals filed for this visit.  Recent Labs  Lab 11/20/23 0444  NA 134*  K 5.6*  CL 96*  CO2 26  GLUCOSE 120*  BUN 55*  CREATININE 7.13*  CALCIUM 7.9*    Recent Labs  Lab 11/19/23 1359 11/19/23 1436 11/19/23 2255  WBC 5.0  --  5.4  NEUTROABS 3.3  --   --   HGB 9.9* 9.9* 10.2*  HCT 30.1* 29.0* 31.5*  MCV 96.5  --  97.5  PLT 146*  --  148*    Scheduled Meds:  acetaminophen  650 mg Oral Q6H   amiodarone  200 mg Oral Daily   apixaban  5 mg Oral BID   atorvastatin  40 mg Oral QHS   Chlorhexidine Gluconate Cloth  6 each Topical Q0600   [START ON 11/21/2023] famotidine  20 mg Oral QODAY   gabapentin  100 mg Oral TID   [name]'s butt cream   Topical BID   insulin aspart  0-20 Units Subcutaneous TID WC   Continuous Infusions:  anticoagulant sodium citrate     PRN Meds:.alteplase, anticoagulant sodium citrate, heparin, lidocaine (PF), lidocaine-prilocaine, oxyCODONE-acetaminophen, pentafluoroprop-tetrafluoroeth   Louie Bun,  MD 11/20/2023, 10:33 AM

## 2023-11-20 NOTE — Consult Note (Signed)
 WOC Nurse Consult Note: patient is familiar to WOC team from previous admission with stage 2 PI to Buttock  Reason for Consult: Stage 2 buttock  Wound type: Stage 2 Pressure Injury Buttock  Pressure Injury POA: Yes Measurement: per nursing flowsheet  Wound bed: pink moist  Drainage (amount, consistency, odor) as per nursing flowsheet Periwound:  Dressing procedure/placement/frequency:  Cleanse bilateral buttocks with soap and water, dry and apply [name]'s Butt Cream 2 times a day. As per skin care protocol apply silicone foam to Stage 2 Pressure Injury, lift foam daily to assess wound. Change foam q3 days and prn soiling.   IF PATIENT DEVELOPS ANY NECROTIC TISSUE (BLACK,BROWN,YELLOW) TO WOUND PLEASE RE-CONSULT WOC TEAM.   POC discussed with primary nurse. WOC team will not follow. Re-consult if further needs arise.   Thank you,    Priscella Mann MSN, RN-BC, [employer] [phone]

## 2023-11-20 NOTE — Progress Notes (Signed)
 Received patient in bed to unit.  Alert and oriented.  Informed consent signed and in chart.   TX duration: 4 hours  Patient tolerated well.  Transported back to the room  Alert, without acute distress.  Hand-off given to patient's nurse.   Access used: Left AV Fistula Access issues: none  Total UF removed: 4 L Medication(s) given: midodrine, albumin   11/20/23 1246  Vitals  Temp 97.7 F (36.5 C)  Temp Source Oral  BP (!) 155/64  MAP (mmHg) 91  BP Location Right Arm  BP Method Automatic  Patient Position (if appropriate) Lying  Pulse Rate 65  Pulse Rate Source Monitor  ECG Heart Rate 65  Resp 11  Oxygen Therapy  SpO2 99 %  O2 Device Nasal Cannula  O2 Flow Rate (L/min) 2 L/min  During Treatment Monitoring  Duration of HD Treatment -hour(s) 4 hour(s)  Cumulative Fluid Removed (mL) per Treatment  4000  HD Safety Checks Performed Yes  Intra-Hemodialysis Comments Tx completed  Dialysis Fluid Bolus Normal Saline  Bolus Amount (mL) 300 mL  Post Treatment  Dialyzer Clearance Lightly streaked  Liters Processed 96  Fluid Removed (mL) 4000 mL  Tolerated HD Treatment Yes  Fistula / Graft Left Upper arm Arteriovenous fistula  Placement Date/Time: 07/21/20 0825   Placed prior to admission: No  Orientation: Left  Access Location: Upper arm  Access Type: Arteriovenous fistula  Status Deaccessed     Stacie Glaze LPN [facility]

## 2023-11-20 NOTE — TOC Transition Note (Signed)
 Transition of Care [facility]) - Discharge Note   Patient Details  Name: [name] MRN: [id number] Date of Birth: 01-Jul-1956  Transition of Care [facility] CM/SW Contact:  Leva Baine A [place], LCSW Phone Number: 11/20/2023, 4:33 PM   Clinical Narrative:     Patient will DC to: [facility]  Anticipated DC date: 11/20/23  Family notified: Melrose Nakayama  Transport by: Sharin Mons      Per MD patient ready for DC to [facility]. RN, patient, patient's family, and facility notified of DC. DC summary  sent to facility. RN to call report prior to discharge [phone]). DC packet on chart. Ambulance transport requested for patient.     CSW will sign off for now as social work intervention is no longer needed. Please consult [place] again if new needs arise.   Final next level of care: Skilled Nursing Facility Barriers to Discharge: Barriers Resolved   Patient Goals and CMS Choice            Discharge Placement              Patient chooses bed at: [facility] Patient to be transferred to facility by: [facility] Name of family member notified: Dolores Patient and family notified of of transfer: 11/20/23  Discharge Plan and Services Additional resources added to the After Visit Summary for                                       Social Drivers of Health (SDOH) Interventions SDOH Screenings   Food Insecurity: No Food Insecurity (11/19/2023)  Housing: Low Risk  (11/19/2023)  Transportation Needs: No Transportation Needs (11/19/2023)  Utilities: Not At Risk (11/19/2023)  Social Connections: Moderately Isolated (11/19/2023)  Tobacco Use: High Risk (11/19/2023)     Readmission Risk Interventions    05/29/2023    4:41 PM 06/30/2021   11:53 AM  Readmission Risk Prevention Plan  Transportation Screening Complete Complete  Medication Review [employer]) Complete Complete  PCP or Specialist appointment within 3-5 days of discharge Complete   HRI or Home Care Consult Complete  Complete  SW Recovery Care/Counseling Consult Complete Complete  Palliative Care Screening Not Applicable Not Applicable  Skilled Nursing Facility Complete Not Applicable

## 2023-11-20 NOTE — Discharge Instructions (Addendum)
 Dear Mr Thal,  It was a pleasure taking care of you at [facility]. You were admitted for low heart rate and low blood pressure and treated for bradycardia. We are discharging you home now that you are doing better. Please follow the following instructions.  1) For your hypotension, we stopped one of your medications that we thought could be causing this.I held  your amlodipine ,hydralazine and Imdur.I also stopped your labetalol. Please follow up with your primary care physician to see when its appropriate to resume this medication if appropriate   2)  Please continue to attend your dialysis session as scheduled . Please follow up with cardiology as usual.  Take care,  Dr. Kathleen Lime, MD

## 2023-11-20 NOTE — Progress Notes (Cosign Needed Addendum)
 Name: [name] MRN: [id number] DOB: [birth year] 68 y.o. PCP: Lonie Peak, PA-C  Date of Admission: 11/19/2023  1:47 PM Date of Discharge: 11/20/2022 Attending Physician: Dr.  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)   Paroxysmal A-fib (HCC)   Benign hypertension   ESRD on dialysis (HCC)   Rash and nonspecific skin eruption   Hypervolemia    Discharge Medications: Allergies as of 11/20/2023       Reactions   Contrast Media [iodinated Contrast Media] Shortness Of Breath, Other (See Comments)   Difficulty breathing and altered mental status   Ivp Dye [iodinated Contrast Media] Anaphylaxis, Shortness Of Breath, Other (See Comments)   Breathing problems, altered mental state   Latex Rash, Other (See Comments)   A severe rash appears after the first 24 hours of being placed   Tape Other (See Comments), Rash   Rash after 1 day of use Do not leave on longer then 3 days with out be changed        Medication List     PAUSE taking these medications    amLODipine 10 MG tablet Wait to take this until your doctor or other care provider tells you to start again. Commonly known as: NORVASC Take 10 mg by mouth in the morning.   hydrALAZINE 50 MG tablet Wait to take this until your doctor or other care provider tells you to start again. Commonly known as: APRESOLINE Take 50 mg by mouth every 8 (eight) hours.   isosorbide mononitrate 60 MG 24 hr tablet Wait to take this until your doctor or other care provider tells you to start again. Commonly known as: IMDUR Take 1 tablet (60 mg total) by mouth daily.       STOP taking these medications    labetalol 200 MG tablet Commonly known as: NORMODYNE        TAKE these medications    amiodarone 200 MG tablet Commonly known as: PACERONE Take 1 tablet (200 mg total) by mouth daily. What changed: when to take this   apixaban 5 MG Tabs tablet Commonly known as: ELIQUIS Take 1 tablet (5 mg total) by mouth 2 (two) times daily.   atorvastatin 40 MG tablet Commonly known as: LIPITOR Take 40 mg by mouth at bedtime.   Dialyvite/Zinc Tabs Take 1 tablet by mouth Every Tuesday,Thursday,and Saturday with dialysis.   famotidine 20 MG tablet Commonly known as: PEPCID Take 20 mg by mouth every other day.   fentaNYL 50 MCG/HR Commonly known as: DURAGESIC 1 patch every 3 (three) days.   folic acid 1 MG tablet Commonly known as: FOLVITE Take 1 mg by mouth in the morning.   gabapentin 100 MG capsule Commonly known as: NEURONTIN Take 100 mg by mouth 3 (three) times daily.   Icosapent Ethyl 0.5 g Caps Take 1 capsule by mouth in the morning.   lidocaine-prilocaine cream Commonly known as: EMLA Apply 1 application topically See admin instructions. Prior to Dialysis days Tuesday,Thursday and saturday   liver oil-zinc oxide 40 % ointment Commonly known as: DESITIN Apply 1 Application topically as needed for irritation.   loratadine 10 MG tablet Commonly known as: CLARITIN Take 10 mg by mouth in the morning.  midodrine 10 MG tablet Commonly known as: PROAMATINE Take 10 mg by mouth daily as needed (SBP less than 100.).   mineral oil-hydrophilic petrolatum ointment Apply 1 Application topically as needed for dry skin or irritation (Upper thigh).   nitroGLYCERIN 0.4 MG SL tablet Commonly known as: NITROSTAT Place 0.4 mg under the tongue every 5 (five) minutes as needed for chest pain.   NovoLOG FlexPen 100 UNIT/ML FlexPen Generic drug: insulin aspart Inject 0-12 Units into the skin See admin instructions. Inject 4 units into the skin with meals in addition to sliding scale as needed. Inject as per sliding scale: If 0 - 69 = 0  initiate hypoglycemia orders;  70 - 150 = 0;  151 - 200 = 2;  201 - 250 = 4;  251 - 300 = 6;  301 - 350 = 8;  351 - 400 = 10;  401 - 450 = 12.  If >400, give 12 units, recheck in 1 hour. If still >400, call MD/NP for further orders. Subcutaneously with meals in addition to scheduled 4 unites with meals.   insulin aspart 100 UNIT/ML injection Commonly known as: novoLOG Inject 4 Units into the skin 3 (three) times daily with meals.   oxyCODONE-acetaminophen 5-325 MG tablet Commonly known as: PERCOCET/ROXICET Take 1 tablet by mouth every 8 (eight) hours as needed for severe pain.   promethazine 12.5 MG suppository Commonly known as: PHENERGAN Place 12.5 mg rectally daily as needed for nausea or vomiting.   rOPINIRole 2 MG tablet Commonly known as: REQUIP Take 2 mg by mouth at bedtime.   tamsulosin 0.4 MG Caps capsule Commonly known as: FLOMAX Take 0.4 mg by mouth in the morning.        Disposition and follow-up:   [name] was discharged from [facility] in Good condition.  At the hospital follow up visit please address:  1.  Follow-up:  a.  For his bradycardia, we held his labetalol and stopped his Imdur, hydralazine and amlodipine. Kindly  reassess and gradually resume these medications when appropriate.  Please ensure he follows up with cardiology as scheduled    b.  For his end-stage renal disease, patient promises to continue his scheduled dialysis on Tuesday Thursdays and Saturdays.  Kindly continue to encourage weight patient and his dialysis sessions  2.  Labs / imaging needed at time of follow-up: N/A  3.  Pending labs/ test needing follow-up: N/A  4.  Medication Changes Labetalol - STOPPED Imbur  - HELD Hydralazine  - HELD Amlodipine - HELD   Follow-up Appointments:  Contact information for after-discharge care     Destination     [facility] .   Service: Skilled Nursing [address] [place] [address] [phone]                     Hospital Course by problem list:  Bradycardia [name] presented to [place] from his dialysis center due to concerns for hypotension and bradycardia in the 70s.  On arrival to the ED patient was still bradycardic in the 60s.  Cardiology was consulted who saw the patient.  His bradycardia was thought to be due to his labetalol so we held it.. We also held his hydralazine, amlodipine, and Imdur.  Patient heart rate resolved to normal limits after these medications were held.  In the setting of his resolved bradycardia which was the main reason why the patient was admitted, we  followed up with cardiology and nephrology who thought it was appropriate to discharge at this time.  Patient is agreeable to the plan and was discharged back to his nursing facility.  Plan was to continue to hold these medications until he follows up with his nephrologist to reassess and resume when appropriate.  Chest pain This patient also reported chest tightness which is concerning for atypical angina.  Pain was somewhat reproducible on palpation.  Did an EKG which was reassuring against ischemia and his troponins peaked on delta.  He was continued on supportive care throughout his hospitalization.  ESRD On HD Tuesday Thursday and Saturday end-stage renal disease.  Acknowledges that he sometimes misses sessions .  Nephrology scheduled having dialyzed today.  Paroxysmal A-fib His Eliquis and amiodarone was continued        Discharge Subjective: Patient was seen today while getting HD.  He seemed much better than he came in.  Reports his breathing is getting better and had no other concerns.  Plans to discharge will discuss with the patient and he agreed to return back to his nursing facility.  Discharge Exam:   BP (!) 171/68 (BP Location: Right Arm)   Pulse 70   Temp 98.2 F (36.8 C) (Oral)   Resp 13   SpO2 98%  Constitutional:  well-appearing man, sitting in bed getting HD, in no acute distress HENT: normocephalic atraumatic, mucous membranes moist Cardiovascular:  Elevated JVD, No murmurs heard  Pulmonary/Chest: Crackles appreciated  Abdominal: soft, non-tender, non-distended. .no fluid wave  Neurological: alert & oriented x 3 MSK: Bilateral BKA . Amputation site looks C/D/I. Bilateral LE edema  Skin: warm and dry Psych: Normal mood and affect  Pertinent Labs, Studies, and Procedures:     Latest Ref Rng & Units 11/19/2023   10:55 PM 11/19/2023    2:36 PM 11/19/2023    1:59 PM  CBC  WBC 4.0 - 10.5 K/uL 5.4   5.0   Hemoglobin 13.0 - 17.0 g/dL 16.1  9.9  9.9   Hematocrit 39.0 - 52.0 % 31.5  29.0  30.1   Platelets 150 - 400 K/uL 148   146        Latest Ref Rng & Units 11/20/2023    4:44 AM 11/19/2023    2:36 PM 11/19/2023    1:59 PM  CMP  Glucose 70 - 99 mg/dL 096  045  409   BUN 8 - 23 mg/dL 55  41  43   Creatinine 0.61 - 1.24 mg/dL 8.11  9.14  7.82   Sodium 135 - 145 mmol/L 134  133  132   Potassium 3.5 - 5.1 mmol/L 5.6  5.3  5.2   Chloride 98 - 111 mmol/L 96  96  95   CO2 22 - 32 mmol/L 26   25   Calcium 8.9 - 10.3 mg/dL 7.9   7.8   Total Protein 6.5 - 8.1 g/dL   5.8   Total Bilirubin 0.0 - 1.2 mg/dL   0.6   Alkaline Phos 38 - 126 U/L   75   AST 15 - 41 U/L   15   ALT 0 - 44 U/L   14     DG Chest Port 1 View Result Date: 11/19/2023 CLINICAL DATA:  Shortness of breath and chest pain. EXAM: PORTABLE CHEST 1 VIEW COMPARISON:  09/22/2023 FINDINGS: Mild cardiomegaly. Aortic atherosclerosis. Stable mediastinal contours. Patchy airspace disease in the lung bases is increased from prior exam. No pulmonary edema, pneumothorax or  large pleural effusion. Lower cervical spine hardware partially included. IMPRESSION: 1. Patchy airspace disease in the lung bases, increased from prior exam. This may represent atelectasis or pneumonia. 2. Mild cardiomegaly. Electronically Signed   By: Narda Rutherford M.D.   On:  11/19/2023 16:30     Discharge Instructions: Discharge Instructions     Diet - low sodium heart healthy   Complete by: As directed    Discharge instructions   Complete by: As directed    Dear [name],  It was a pleasure taking care of you at [facility]. You were admitted for low heart rate and low blood pressure and treated for bradycardia. We are discharging you home now that you are doing better. Please follow the following instructions.  1) For your hypotension, we stopped one of your medications that we thought could be causing this.I held  your amlodipine ,hydralazine and Imdur.I also stopped your labetalol. Please follow up with your primary care physician to see when its appropriate to resume this medication if appropriate   2)  Please continue to attend your dialysis session as scheduled . Please follow up with cardiology as usual.  Take care,  Dr. Kathleen Lime, MD   Increase activity slowly   Complete by: As directed    Increase activity slowly   Complete by: As directed    No wound care   Complete by: As directed    No wound care   Complete by: As directed        Signed: Kathleen Lime, MD Internal Medicine Teaching Service Pager [phone]

## 2023-11-20 NOTE — Discharge Planning (Signed)
 [facility] Patient Discharge Orders- [facility] CLINIC: [facility]   Patient's name: [name] Admit/DC Dates: 11/19/2023 - 11/20/2023  Discharge Diagnoses: Bradycardia -- Resolved. Labetalol discontinued   Recent Labs  Lab 11/19/23 1359 11/19/23 1436 11/19/23 2255 11/20/23 0444  HGB 9.9*   < > 10.2*  --   K 5.2*   < >  --  5.6*  CALCIUM 7.8*  --   --  7.9*  ALBUMIN 3.0*  --   --   --    < > = values in this interval not displayed.   Aranesp: Given: --   Date of last dose/amount: --   PRBC's Given: -- Date/# of units: -- ESA dose for discharge: No change   Outpatient Dialysis Orders:  -Heparin: No change   -[place]  No change    -EDW: No change.   Access intervention/Change: --  IV antibiotics: --    OTHER/APPTS   Completed by: Tomasa Blase PA-C   D/C Meds to be reconciled by nurse after every discharge.    Reviewed by: MD:______ RN_______

## 2023-11-20 NOTE — Progress Notes (Signed)
 D/C order noted. Contacted [facility] to be advised of pt's d/c today and that pt should resume care tomorrow.   Olivia Canter Renal Navigator [phone]

## 2023-11-20 NOTE — Progress Notes (Signed)
 Nephrology Follow-Up Consult note   Assessment/Recommendations: [name] is a/an 68 y.o. male with a past medical history significant for esrd, admitted for bradycardia.      Dialysis Orders:  [facility] TTS 4:15 450/800 EDW 107kg 2K/2Ca  AVF No heparin Not reaching EDW. Post HD wt 3/8 - 117kg -Mircera 150 q 2 wks (last 2/22) Venofer 50 q wk -Hectorol 2 q HD   Assessment/Plan: Symptomatic bradycardia -possibly related to beta-blocker.  Has improved.  Continue to monitor.  Cardiology following Elevated troponin/CAD - per cardiology  ESRD -  HD TTS. Receiving dialysis this morning.  Plan for dialysis again tomorrow if he remains inpatient to continue on schedule. Chronic volume overload - ongoing issue. No respiratory distress. UF as able.  Hypertension  - amlodipine, hydralazine, labetalol on med list. Also on midodrine.  Holding blood pressure medications.  Consider restarting amlodipine as needed.  Would not start beta-blocker back.  Only midodrine if needed on dialysis Anemia  - On ESA as outpatient. Follow trends here  Metabolic bone disease -  Continue home meds PAfib - on amiodarone, Eliquis.    Recommendations conveyed to primary service.    Darnell Level [facility] 11/20/2023 10:30 AM  ___________________________________________________________  CC: bradycardia  Interval History/Subjective: Says he does not feel great with some shortness of breath.  Otherwise no complaints.  Heart rates have improved.   Medications:  Current Facility-Administered Medications  Medication Dose Route Frequency Provider Last Rate Last Admin   acetaminophen (TYLENOL) tablet 650 mg  650 mg Oral Q6H Marrianne Mood, MD   650 mg at 11/20/23 0748   alteplase (CATHFLO ACTIVASE) injection 2 mg  2 mg Intracatheter Once PRN Tomasa Blase, PA-C       amiodarone (PACERONE) tablet 200 mg  200 mg Oral Daily Marrianne Mood, MD       anticoagulant sodium citrate solution  5 mL  5 mL Intracatheter PRN Ejigiri, Megan Mans, PA-C       apixaban Everlene Balls) tablet 5 mg  5 mg Oral BID Marrianne Mood, MD   5 mg at 11/19/23 2155   atorvastatin (LIPITOR) tablet 40 mg  40 mg Oral Alisia Ferrari, MD   40 mg at 11/19/23 2155   Chlorhexidine Gluconate Cloth 2 % PADS 6 each  6 each Topical Q0600 Tomasa Blase, PA-C       [START ON 11/21/2023] famotidine (PEPCID) tablet 20 mg  20 mg Oral Garnette Gunner, MD       gabapentin (NEURONTIN) capsule 100 mg  100 mg Oral TID Marrianne Mood, MD   100 mg at 11/19/23 2155   [name]'s butt cream   Topical BID Mercie Eon, MD       heparin injection 1,000 Units  1,000 Units Intracatheter PRN Ejigiri, Megan Mans, PA-C       insulin aspart (novoLOG) injection 0-20 Units  0-20 Units Subcutaneous TID WC Marrianne Mood, MD       lidocaine (PF) (XYLOCAINE) 1 % injection 5 mL  5 mL Intradermal PRN Tomasa Blase, PA-C       lidocaine-prilocaine (EMLA) cream 1 Application  1 Application Topical PRN Tomasa Blase, PA-C       oxyCODONE-acetaminophen (PERCOCET/ROXICET) 5-325 MG per tablet 1 tablet  1 tablet Oral Q8H PRN Marrianne Mood, MD   1 tablet at 11/20/23 0818   pentafluoroprop-tetrafluoroeth (GEBAUERS) aerosol 1 Application  1 Application Topical PRN Tomasa Blase, PA-C  Review of Systems: 10 systems reviewed and negative except per interval history/subjective  Physical Exam: Vitals:   11/20/23 0930 11/20/23 1000  BP: (!) 145/66 (!) 148/67  Pulse: 62 63  Resp: 16 15  Temp:    SpO2: 98% 99%   No intake/output data recorded. No intake or output data in the 24 hours ending 11/20/23 1030 Constitutional: well-appearing, no acute distress ENMT: ears and nose without scars or lesions, MMM CV: normal rate, bilateral stump edema, bilateral BKA Respiratory: clear to auscultation, normal work of breathing Gastrointestinal: soft, non-tender, no palpable masses or  hernias Skin: no visible lesions or rashes Psych: alert, judgement/insight appropriate, appropriate mood and affect   Test Results I personally reviewed new and old clinical labs and radiology tests Lab Results  Component Value Date   NA 134 (L) 11/20/2023   K 5.6 (H) 11/20/2023   CL 96 (L) 11/20/2023   CO2 26 11/20/2023   BUN 55 (H) 11/20/2023   CREATININE 7.13 (H) 11/20/2023   GFR 85.57 03/11/2012   CALCIUM 7.9 (L) 11/20/2023   ALBUMIN 3.0 (L) 11/19/2023   PHOS 6.5 (H) 07/20/2023    CBC Recent Labs  Lab 11/19/23 1359 11/19/23 1436 11/19/23 2255  WBC 5.0  --  5.4  NEUTROABS 3.3  --   --   HGB 9.9* 9.9* 10.2*  HCT 30.1* 29.0* 31.5*  MCV 96.5  --  97.5  PLT 146*  --  148*

## 2023-11-20 NOTE — Progress Notes (Signed)
 Pt receives out-pt HD at [facility] on TTS. Will assist as needed.   Olivia Canter Renal Navigator [phone]

## 2023-11-20 NOTE — Progress Notes (Signed)
   Patient Name: KARL ERWAY Date of Encounter: 11/20/2023 [facility] Cardiologist: None Ganji  Interval Summary  .    See in HD, doing well tolerating run for last 1 hour.   Vital Signs .    Vitals:   11/20/23 0845 11/20/23 0850 11/20/23 0900 11/20/23 0930  BP: (!) 64/54 (!) 107/41 (!) 128/54 (!) 145/66  Pulse: (!) 44 (!) 47 (!) 56 62  Resp: 13 13 14 16   Temp:      TempSrc:      SpO2: 98% 99% 100% 98%   No intake or output data in the 24 hours ending 11/20/23 0949    09/26/2023    1:41 PM 09/25/2023    8:59 AM 09/22/2023    1:25 AM  Last 3 Weights  Weight (lbs) 227 lb 1.2 oz 229 lb 4.5 oz 270 lb  Weight (kg) 103 kg 104 kg 122.471 kg      Telemetry/ECG    SR/SB - Personally Reviewed  Physical Exam .   GEN: No acute distress.   Neck: No JVD Cardiac: RRR, 2/6 systolic murmur Respiratory: Clear to auscultation bilaterally. GI: Soft, nontender, non-distended  MS: bilateral amputation  Assessment & Plan .     Paroxysmal atrial fibrillation Bradycardia - bradycardia resolved, HR 62 with normal BP in dialysis.   - discontinue labetalol - continue amiodarone 200 mg daily and eliquis 5 mg BID  CAD Elevated troponin  Patient s/p angioplasty to RCA in 2016. Has since had multiple admissions with chest pain. Per outpatient notes, conservative management has been discussed. No recurrence of chest pain in recent timeframe.  -No current chest pain Troponin elevation is not consistent with ACS, rather demand ischema in patient with ESRD.  No ASA with Eliquis Continue Atorvastatin 40 mg daily Resume Imdur this admission as BP allows   Hypertension Patient with hypotension in the setting of dialysis . PTA med list includes Amlodipine 10mg , Imdur 60mg . Continue holding home anti-hypertensives with low normal BP. Resume as able or tolerated.   HFpEF Last echo from 03/31/23 with LVEF 60-65%, grade I DD. 2+ pitting edema in bilateral knee stumps .  Volume  management per HD.  Tolerating HD currently  Pt has cardiology follow up arranged. HR trending in correct direction off of labetalol   For questions or updates, please contact [facility] Please consult [email or web address] for contact info under        Signed, Parke Poisson, MD

## 2023-11-20 NOTE — Progress Notes (Addendum)
 Attempted to call [facility] x2 to provide report but no answer. Patient discharging back to facility today via [facility]. AVS printed and placed in d/c packet.

## 2023-11-20 NOTE — Progress Notes (Signed)
 t

## 2023-11-20 NOTE — Progress Notes (Signed)
 Transition of Care [facility]) - Inpatient Brief Assessment   Patient Details  Name: [name] MRN: [id number] Date of Birth: [birth year]  [facility] of [facility]) CM/SW Contact:    Oletta Cohn, RN Phone Number: 11/20/2023, 8:40 AM   Clinical Narrative: From [facility]; RNCM contacted [facility] to notify of pt ED encounter. HD is scheduled for TTS at [facility].  TOC will continue to follow.  Transition of Care Asessment: Insurance and Status: Insurance coverage has been reviewed Patient has primary care physician: Yes Home environment has been reviewed: from [facility] Prior level of function:: (P) needs assistance with ADLs and ambulation Prior/Current Home Services: (P) Current home services Social Drivers of Health Review: (P) SDOH reviewed needs interventions Readmission risk has been reviewed: (P) Yes Transition of care needs: (P) transition of care needs identified, TOC will continue to follow

## 2023-11-21 DIAGNOSIS — I953 Hypotension of hemodialysis: Secondary | ICD-10-CM | POA: Diagnosis not present

## 2023-11-21 NOTE — Discharge Summary (Signed)
 Name: [name] MRN: [id number] DOB: [birth year] 68 y.o. PCP: Lonie Peak, PA-C  Date of Admission: 11/19/2023  1:47 PM Date of Discharge: 11/20/2022 Attending Physician: Dr.  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)   Paroxysmal A-fib (HCC)   Benign hypertension   ESRD on dialysis (HCC)   Rash and nonspecific skin eruption   Hypervolemia    Discharge Medications: Allergies as of 11/20/2023       Reactions   Contrast Media [iodinated Contrast Media] Shortness Of Breath, Other (See Comments)   Difficulty breathing and altered mental status   Ivp Dye [iodinated Contrast Media] Anaphylaxis, Shortness Of Breath, Other (See Comments)   Breathing problems, altered mental state   Latex Rash, Other (See Comments)   A severe rash appears after the first 24 hours of being placed   Tape Other (See Comments), Rash   Rash after 1 day of use Do not leave on longer then 3 days with out be changed        Medication List     PAUSE taking these medications    amLODipine 10 MG tablet Wait to take this until your doctor or other care provider tells you to start again. Commonly known as: NORVASC Take 10 mg by mouth in the morning.   hydrALAZINE 50 MG tablet Wait to take this until your doctor or other care provider tells you to start again. Commonly known as: APRESOLINE Take 50 mg by mouth every 8 (eight) hours.   isosorbide mononitrate 60 MG 24 hr tablet Wait to take this until your doctor or other care provider tells you to start again. Commonly known as: IMDUR Take 1 tablet (60 mg total) by mouth daily.       STOP taking these medications    labetalol 200 MG tablet Commonly known as: NORMODYNE        TAKE these medications    amiodarone 200 MG tablet Commonly known as: PACERONE Take 1 tablet (200 mg total) by mouth daily. What changed: when to take this   apixaban 5 MG Tabs tablet Commonly known as: ELIQUIS Take 1 tablet (5 mg total) by mouth 2 (two) times daily.   atorvastatin 40 MG tablet Commonly known as: LIPITOR Take 40 mg by mouth at bedtime.   Dialyvite/Zinc Tabs Take 1 tablet by mouth Every Tuesday,Thursday,and Saturday with dialysis.   famotidine 20 MG tablet Commonly known as: PEPCID Take 20 mg by mouth every other day.   fentaNYL 50 MCG/HR Commonly known as: DURAGESIC 1 patch every 3 (three) days.   folic acid 1 MG tablet Commonly known as: FOLVITE Take 1 mg by mouth in the morning.   gabapentin 100 MG capsule Commonly known as: NEURONTIN Take 100 mg by mouth 3 (three) times daily.   Icosapent Ethyl 0.5 g Caps Take 1 capsule by mouth in the morning.   lidocaine-prilocaine cream Commonly known as: EMLA Apply 1 application topically See admin instructions. Prior to Dialysis days Tuesday,Thursday and saturday   liver oil-zinc oxide 40 % ointment Commonly known as: DESITIN Apply 1 Application topically as needed for irritation.   loratadine 10 MG tablet Commonly known as: CLARITIN Take 10 mg by mouth in the morning.  midodrine 10 MG tablet Commonly known as: PROAMATINE Take 10 mg by mouth daily as needed (SBP less than 100.).   mineral oil-hydrophilic petrolatum ointment Apply 1 Application topically as needed for dry skin or irritation (Upper thigh).   nitroGLYCERIN 0.4 MG SL tablet Commonly known as: NITROSTAT Place 0.4 mg under the tongue every 5 (five) minutes as needed for chest pain.   NovoLOG FlexPen 100 UNIT/ML FlexPen Generic drug: insulin aspart Inject 0-12 Units into the skin See admin instructions. Inject 4 units into the skin with meals in addition to sliding scale as needed. Inject as per sliding scale: If 0 - 69 = 0  initiate hypoglycemia orders;  70 - 150 = 0;  151 - 200 = 2;  201 - 250 = 4;  251 - 300 = 6;  301 - 350 = 8;  351 - 400 = 10;  401 - 450 = 12.  If >400, give 12 units, recheck in 1 hour. If still >400, call MD/NP for further orders. Subcutaneously with meals in addition to scheduled 4 unites with meals.   insulin aspart 100 UNIT/ML injection Commonly known as: novoLOG Inject 4 Units into the skin 3 (three) times daily with meals.   oxyCODONE-acetaminophen 5-325 MG tablet Commonly known as: PERCOCET/ROXICET Take 1 tablet by mouth every 8 (eight) hours as needed for severe pain.   promethazine 12.5 MG suppository Commonly known as: PHENERGAN Place 12.5 mg rectally daily as needed for nausea or vomiting.   rOPINIRole 2 MG tablet Commonly known as: REQUIP Take 2 mg by mouth at bedtime.   tamsulosin 0.4 MG Caps capsule Commonly known as: FLOMAX Take 0.4 mg by mouth in the morning.        Disposition and follow-up:   Harold.[name] was discharged from [facility] in Good condition.  At the hospital follow up visit please address:  1.  Follow-up:  a.  For his bradycardia, we held his labetalol and stopped his Imdur, hydralazine and amlodipine. Kindly  reassess and gradually resume these medications when appropriate.  Please ensure he follows up with cardiology as scheduled    b.  For his end-stage renal disease, patient promises to continue his scheduled dialysis on Tuesday Thursdays and Saturdays.  Kindly continue to encourage weight patient and his dialysis sessions  2.  Labs / imaging needed at time of follow-up: N/A  3.  Pending labs/ test needing follow-up: N/A  4.  Medication Changes Labetalol - STOPPED Imbur  - HELD Hydralazine  - HELD Amlodipine - HELD   Follow-up Appointments:  Contact information for after-discharge care     Destination     [facility] .   Service: Skilled Nursing [address] [place] [address] [phone]                     Hospital Course by problem list:  Bradycardia [name] presented to [place] from his dialysis center due to concerns for hypotension and bradycardia in the 70s.  On arrival to the ED patient was still bradycardic in the 60s.  Cardiology was consulted who saw the patient.  His bradycardia was thought to be due to his labetalol so we held it.. We also held his hydralazine, amlodipine, and Imdur.  Patient heart rate resolved to normal limits after these medications were held.  In the setting of his resolved bradycardia which was the main reason why the patient was admitted, we  followed up with cardiology and nephrology who thought it was appropriate to discharge at this time.  Patient is agreeable to the plan and was discharged back to his nursing facility.  Plan was to continue to hold these medications until he follows up with his nephrologist to reassess and resume when appropriate.  Chest pain This patient also reported chest tightness which is concerning for atypical angina.  Pain was somewhat reproducible on palpation.  Did an EKG which was reassuring against ischemia and his troponins peaked on delta.  He was continued on supportive care throughout his hospitalization.  ESRD On HD Tuesday Thursday and Saturday end-stage renal disease.  Acknowledges that he sometimes misses sessions .  Nephrology scheduled having dialyzed today.  Paroxysmal A-fib His Eliquis and amiodarone was continued        Discharge Subjective: Patient was seen today while getting HD.  He seemed much better than he came in.  Reports his breathing is getting better and had no other concerns.  Plans to discharge will discuss with the patient and he agreed to return back to his nursing facility.  Discharge Exam:   BP (!) 171/68 (BP Location: Right Arm)   Pulse 70   Temp 98.2 F (36.8 C) (Oral)   Resp 13   SpO2 98%  Constitutional:  well-appearing man, sitting in bed getting HD, in no acute distress HENT: normocephalic atraumatic, mucous membranes moist Cardiovascular:  Elevated JVD, No murmurs heard  Pulmonary/Chest: Crackles appreciated  Abdominal: soft, non-tender, non-distended. .no fluid wave  Neurological: alert & oriented x 3 MSK: Bilateral BKA . Amputation site looks C/D/I. Bilateral LE edema  Skin: warm and dry Psych: Normal mood and affect  Pertinent Labs, Studies, and Procedures:     Latest Ref Rng & Units 11/19/2023   10:55 PM 11/19/2023    2:36 PM 11/19/2023    1:59 PM  CBC  WBC 4.0 - 10.5 K/uL 5.4   5.0   Hemoglobin 13.0 - 17.0 g/dL 16.1  9.9  9.9   Hematocrit 39.0 - 52.0 % 31.5  29.0  30.1   Platelets 150 - 400 K/uL 148   146        Latest Ref Rng & Units 11/20/2023    4:44 AM 11/19/2023    2:36 PM 11/19/2023    1:59 PM  CMP  Glucose 70 - 99 mg/dL 096  045  409   BUN 8 - 23 mg/dL 55  41  43   Creatinine 0.61 - 1.24 mg/dL 8.11  9.14  7.82   Sodium 135 - 145 mmol/L 134  133  132   Potassium 3.5 - 5.1 mmol/L 5.6  5.3  5.2   Chloride 98 - 111 mmol/L 96  96  95   CO2 22 - 32 mmol/L 26   25   Calcium 8.9 - 10.3 mg/dL 7.9   7.8   Total Protein 6.5 - 8.1 g/dL   5.8   Total Bilirubin 0.0 - 1.2 mg/dL   0.6   Alkaline Phos 38 - 126 U/L   75   AST 15 - 41 U/L   15   ALT 0 - 44 U/L   14     DG Chest Port 1 View Result Date: 11/19/2023 CLINICAL DATA:  Shortness of breath and chest pain. EXAM: PORTABLE CHEST 1 VIEW COMPARISON:  09/22/2023 FINDINGS: Mild cardiomegaly. Aortic atherosclerosis. Stable mediastinal contours. Patchy airspace disease in the lung bases is increased from prior exam. No pulmonary edema, pneumothorax or  large pleural effusion. Lower cervical spine hardware partially included. IMPRESSION: 1. Patchy airspace disease in the lung bases, increased from prior exam. This may represent atelectasis or pneumonia. 2. Mild cardiomegaly. Electronically Signed   By: Narda Rutherford M.D.   On:  11/19/2023 16:30     Discharge Instructions: Discharge Instructions     Diet - low sodium heart healthy   Complete by: As directed    Discharge instructions   Complete by: As directed    Dear [name],  It was a pleasure taking care of you at [facility]. You were admitted for low heart rate and low blood pressure and treated for bradycardia. We are discharging you home now that you are doing better. Please follow the following instructions.  1) For your hypotension, we stopped one of your medications that we thought could be causing this.I held  your amlodipine ,hydralazine and Imdur.I also stopped your labetalol. Please follow up with your primary care physician to see when its appropriate to resume this medication if appropriate   2)  Please continue to attend your dialysis session as scheduled . Please follow up with cardiology as usual.  Take care,  Dr. Kathleen Lime, MD   Increase activity slowly   Complete by: As directed    Increase activity slowly   Complete by: As directed    No wound care   Complete by: As directed    No wound care   Complete by: As directed        Signed: Kathleen Lime, MD Internal Medicine Teaching Service Pager [phone]

## 2023-11-23 ENCOUNTER — Emergency Department (HOSPITAL_COMMUNITY)

## 2023-11-23 ENCOUNTER — Inpatient Hospital Stay (HOSPITAL_COMMUNITY)
Admission: EM | Admit: 2023-11-23 | Discharge: 2023-11-25 | DRG: 323 | Disposition: A | Discharge status: Skilled Nursing Facility | Source: Skilled Nursing Facility | Attending: Internal Medicine | Admitting: Internal Medicine

## 2023-11-23 ENCOUNTER — Other Ambulatory Visit: Payer: Self-pay

## 2023-11-23 ENCOUNTER — Encounter (HOSPITAL_COMMUNITY): Payer: Self-pay

## 2023-11-23 DIAGNOSIS — Z91041 Radiographic dye allergy status: Secondary | ICD-10-CM

## 2023-11-23 DIAGNOSIS — I452 Bifascicular block: Secondary | ICD-10-CM | POA: Diagnosis present

## 2023-11-23 DIAGNOSIS — D696 Thrombocytopenia, unspecified: Secondary | ICD-10-CM | POA: Diagnosis present

## 2023-11-23 DIAGNOSIS — I5032 Chronic diastolic (congestive) heart failure: Secondary | ICD-10-CM | POA: Diagnosis present

## 2023-11-23 DIAGNOSIS — D539 Nutritional anemia, unspecified: Secondary | ICD-10-CM | POA: Diagnosis present

## 2023-11-23 DIAGNOSIS — Z89511 Acquired absence of right leg below knee: Secondary | ICD-10-CM

## 2023-11-23 DIAGNOSIS — I214 Non-ST elevation (NSTEMI) myocardial infarction: Secondary | ICD-10-CM | POA: Diagnosis not present

## 2023-11-23 DIAGNOSIS — I161 Hypertensive emergency: Secondary | ICD-10-CM | POA: Diagnosis present

## 2023-11-23 DIAGNOSIS — G2581 Restless legs syndrome: Secondary | ICD-10-CM | POA: Diagnosis present

## 2023-11-23 DIAGNOSIS — Z833 Family history of diabetes mellitus: Secondary | ICD-10-CM

## 2023-11-23 DIAGNOSIS — G8929 Other chronic pain: Secondary | ICD-10-CM | POA: Diagnosis present

## 2023-11-23 DIAGNOSIS — I10 Essential (primary) hypertension: Secondary | ICD-10-CM | POA: Diagnosis present

## 2023-11-23 DIAGNOSIS — D631 Anemia in chronic kidney disease: Secondary | ICD-10-CM | POA: Diagnosis present

## 2023-11-23 DIAGNOSIS — Z8249 Family history of ischemic heart disease and other diseases of the circulatory system: Secondary | ICD-10-CM

## 2023-11-23 DIAGNOSIS — E113512 Type 2 diabetes mellitus with proliferative diabetic retinopathy with macular edema, left eye: Secondary | ICD-10-CM | POA: Diagnosis present

## 2023-11-23 DIAGNOSIS — E785 Hyperlipidemia, unspecified: Secondary | ICD-10-CM | POA: Diagnosis present

## 2023-11-23 DIAGNOSIS — Z79899 Other long term (current) drug therapy: Secondary | ICD-10-CM

## 2023-11-23 DIAGNOSIS — Z7982 Long term (current) use of aspirin: Secondary | ICD-10-CM

## 2023-11-23 DIAGNOSIS — Z9049 Acquired absence of other specified parts of digestive tract: Secondary | ICD-10-CM

## 2023-11-23 DIAGNOSIS — Z992 Dependence on renal dialysis: Secondary | ICD-10-CM

## 2023-11-23 DIAGNOSIS — R079 Chest pain, unspecified: Secondary | ICD-10-CM

## 2023-11-23 DIAGNOSIS — F17210 Nicotine dependence, cigarettes, uncomplicated: Secondary | ICD-10-CM | POA: Diagnosis present

## 2023-11-23 DIAGNOSIS — Z83438 Family history of other disorder of lipoprotein metabolism and other lipidemia: Secondary | ICD-10-CM

## 2023-11-23 DIAGNOSIS — R42 Dizziness and giddiness: Secondary | ICD-10-CM | POA: Diagnosis not present

## 2023-11-23 DIAGNOSIS — I959 Hypotension, unspecified: Secondary | ICD-10-CM | POA: Diagnosis not present

## 2023-11-23 DIAGNOSIS — Z7902 Long term (current) use of antithrombotics/antiplatelets: Secondary | ICD-10-CM

## 2023-11-23 DIAGNOSIS — Z91048 Other nonmedicinal substance allergy status: Secondary | ICD-10-CM

## 2023-11-23 DIAGNOSIS — Z89512 Acquired absence of left leg below knee: Secondary | ICD-10-CM

## 2023-11-23 DIAGNOSIS — Z803 Family history of malignant neoplasm of breast: Secondary | ICD-10-CM

## 2023-11-23 DIAGNOSIS — J449 Chronic obstructive pulmonary disease, unspecified: Secondary | ICD-10-CM | POA: Diagnosis present

## 2023-11-23 DIAGNOSIS — Z8673 Personal history of transient ischemic attack (TIA), and cerebral infarction without residual deficits: Secondary | ICD-10-CM

## 2023-11-23 DIAGNOSIS — Z91040 Latex allergy status: Secondary | ICD-10-CM

## 2023-11-23 DIAGNOSIS — N186 End stage renal disease: Secondary | ICD-10-CM | POA: Diagnosis present

## 2023-11-23 DIAGNOSIS — N2581 Secondary hyperparathyroidism of renal origin: Secondary | ICD-10-CM | POA: Diagnosis present

## 2023-11-23 DIAGNOSIS — Z794 Long term (current) use of insulin: Secondary | ICD-10-CM

## 2023-11-23 DIAGNOSIS — I480 Paroxysmal atrial fibrillation: Secondary | ICD-10-CM | POA: Diagnosis present

## 2023-11-23 DIAGNOSIS — Z8042 Family history of malignant neoplasm of prostate: Secondary | ICD-10-CM

## 2023-11-23 DIAGNOSIS — L259 Unspecified contact dermatitis, unspecified cause: Secondary | ICD-10-CM | POA: Diagnosis present

## 2023-11-23 DIAGNOSIS — R001 Bradycardia, unspecified: Principal | ICD-10-CM | POA: Diagnosis present

## 2023-11-23 DIAGNOSIS — R531 Weakness: Secondary | ICD-10-CM | POA: Diagnosis not present

## 2023-11-23 DIAGNOSIS — M898X9 Other specified disorders of bone, unspecified site: Secondary | ICD-10-CM | POA: Diagnosis present

## 2023-11-23 DIAGNOSIS — R0989 Other specified symptoms and signs involving the circulatory and respiratory systems: Secondary | ICD-10-CM | POA: Diagnosis not present

## 2023-11-23 DIAGNOSIS — Z832 Family history of diseases of the blood and blood-forming organs and certain disorders involving the immune mechanism: Secondary | ICD-10-CM

## 2023-11-23 DIAGNOSIS — E1122 Type 2 diabetes mellitus with diabetic chronic kidney disease: Secondary | ICD-10-CM | POA: Diagnosis present

## 2023-11-23 DIAGNOSIS — Z800 Family history of malignant neoplasm of digestive organs: Secondary | ICD-10-CM

## 2023-11-23 DIAGNOSIS — K219 Gastro-esophageal reflux disease without esophagitis: Secondary | ICD-10-CM | POA: Diagnosis present

## 2023-11-23 DIAGNOSIS — E1140 Type 2 diabetes mellitus with diabetic neuropathy, unspecified: Secondary | ICD-10-CM | POA: Diagnosis present

## 2023-11-23 DIAGNOSIS — R918 Other nonspecific abnormal finding of lung field: Secondary | ICD-10-CM | POA: Diagnosis not present

## 2023-11-23 DIAGNOSIS — Z7901 Long term (current) use of anticoagulants: Secondary | ICD-10-CM

## 2023-11-23 DIAGNOSIS — Z955 Presence of coronary angioplasty implant and graft: Secondary | ICD-10-CM

## 2023-11-23 DIAGNOSIS — I132 Hypertensive heart and chronic kidney disease with heart failure and with stage 5 chronic kidney disease, or end stage renal disease: Secondary | ICD-10-CM | POA: Diagnosis present

## 2023-11-23 DIAGNOSIS — Z95828 Presence of other vascular implants and grafts: Secondary | ICD-10-CM

## 2023-11-23 DIAGNOSIS — I2510 Atherosclerotic heart disease of native coronary artery without angina pectoris: Secondary | ICD-10-CM | POA: Diagnosis present

## 2023-11-23 DIAGNOSIS — F17290 Nicotine dependence, other tobacco product, uncomplicated: Secondary | ICD-10-CM | POA: Diagnosis present

## 2023-11-23 DIAGNOSIS — Z8261 Family history of arthritis: Secondary | ICD-10-CM

## 2023-11-23 DIAGNOSIS — Z96651 Presence of right artificial knee joint: Secondary | ICD-10-CM | POA: Diagnosis present

## 2023-11-23 DIAGNOSIS — E1151 Type 2 diabetes mellitus with diabetic peripheral angiopathy without gangrene: Secondary | ICD-10-CM | POA: Diagnosis present

## 2023-11-23 DIAGNOSIS — R0902 Hypoxemia: Secondary | ICD-10-CM | POA: Diagnosis not present

## 2023-11-23 DIAGNOSIS — K760 Fatty (change of) liver, not elsewhere classified: Secondary | ICD-10-CM | POA: Diagnosis present

## 2023-11-23 LAB — COMPREHENSIVE METABOLIC PANEL
ALT: 11 U/L (ref 0–44)
AST: 20 U/L (ref 15–41)
Albumin: 2.9 g/dL — ABNORMAL LOW (ref 3.5–5.0)
Alkaline Phosphatase: 74 U/L (ref 38–126)
Anion gap: 15 (ref 5–15)
BUN: 28 mg/dL — ABNORMAL HIGH (ref 8–23)
CO2: 22 mmol/L (ref 22–32)
Calcium: 7.4 mg/dL — ABNORMAL LOW (ref 8.9–10.3)
Chloride: 98 mmol/L (ref 98–111)
Creatinine, Ser: 5.08 mg/dL — ABNORMAL HIGH (ref 0.61–1.24)
GFR, Estimated: 12 mL/min — ABNORMAL LOW (ref >60)
Glucose, Bld: 163 mg/dL — ABNORMAL HIGH (ref 70–99)
Potassium: 4.1 mmol/L (ref 3.5–5.1)
Sodium: 135 mmol/L (ref 135–145)
Total Bilirubin: 0.6 mg/dL (ref 0.0–1.2)
Total Protein: 5.2 g/dL — ABNORMAL LOW (ref 6.5–8.1)

## 2023-11-23 LAB — TROPONIN I (HIGH SENSITIVITY)
Troponin I (High Sensitivity): 336 ng/L (ref <18)
Troponin I (High Sensitivity): 381 ng/L (ref <18)
Troponin I (High Sensitivity): 442 ng/L (ref <18)
Troponin I (High Sensitivity): 418 ng/L (ref <18)

## 2023-11-23 LAB — I-STAT CHEM 8, ED
BUN: 32 mg/dL — ABNORMAL HIGH (ref 8–23)
Calcium, Ion: 0.8 mmol/L — CL (ref 1.15–1.40)
Chloride: 102 mmol/L (ref 98–111)
Creatinine, Ser: 5.5 mg/dL — ABNORMAL HIGH (ref 0.61–1.24)
Glucose, Bld: 160 mg/dL — ABNORMAL HIGH (ref 70–99)
HCT: 28 % — ABNORMAL LOW (ref 39.0–52.0)
Hemoglobin: 9.5 g/dL — ABNORMAL LOW (ref 13.0–17.0)
Potassium: 4 mmol/L (ref 3.5–5.1)
Sodium: 135 mmol/L (ref 135–145)
TCO2: 26 mmol/L (ref 22–32)

## 2023-11-23 LAB — CBC WITH DIFFERENTIAL/PLATELET
Abs Immature Granulocytes: 0.02 10*3/uL (ref 0.00–0.07)
Basophils Absolute: 0 10*3/uL (ref 0.0–0.1)
Basophils Relative: 1 %
Eosinophils Absolute: 0.2 10*3/uL (ref 0.0–0.5)
Eosinophils Relative: 4 %
HCT: 29.6 % — ABNORMAL LOW (ref 39.0–52.0)
Hemoglobin: 9.3 g/dL — ABNORMAL LOW (ref 13.0–17.0)
Immature Granulocytes: 0 %
Lymphocytes Relative: 13 %
Lymphs Abs: 0.7 10*3/uL (ref 0.7–4.0)
MCH: 31.6 pg (ref 26.0–34.0)
MCHC: 31.4 g/dL (ref 30.0–36.0)
MCV: 100.7 fL — ABNORMAL HIGH (ref 80.0–100.0)
Monocytes Absolute: 0.5 10*3/uL (ref 0.1–1.0)
Monocytes Relative: 9 %
Neutro Abs: 4.3 10*3/uL (ref 1.7–7.7)
Neutrophils Relative %: 73 %
Platelets: 118 10*3/uL — ABNORMAL LOW (ref 150–400)
RBC: 2.94 MIL/uL — ABNORMAL LOW (ref 4.22–5.81)
RDW: 14 % (ref 11.5–15.5)
WBC: 5.8 10*3/uL (ref 4.0–10.5)
nRBC: 0 % (ref 0.0–0.2)

## 2023-11-23 LAB — GLUCOSE, CAPILLARY
Glucose-Capillary: 210 mg/dL — ABNORMAL HIGH (ref 70–99)
Glucose-Capillary: 162 mg/dL — ABNORMAL HIGH (ref 70–99)

## 2023-11-23 MED ORDER — FAMOTIDINE 20 MG PO TABS
20.0000 mg | ORAL_TABLET | ORAL | Status: DC
  Administered 2023-11-24: 20 mg via ORAL
  Filled 2023-11-23: qty 1

## 2023-11-23 MED ORDER — OXYCODONE-ACETAMINOPHEN 5-325 MG PO TABS
1.0000 | ORAL_TABLET | Freq: Three times a day (TID) | ORAL | Status: DC | PRN
  Administered 2023-11-23 – 2023-11-25 (×6): 1 via ORAL
  Filled 2023-11-23 (×6): qty 1

## 2023-11-23 MED ORDER — MORPHINE SULFATE (PF) 2 MG/ML IV SOLN
2.0000 mg | Freq: Once | INTRAVENOUS | Status: AC
  Administered 2023-11-23: 2 mg via INTRAVENOUS
  Filled 2023-11-23: qty 1

## 2023-11-23 MED ORDER — INSULIN ASPART 100 UNIT/ML IJ SOLN
0.0000 [IU] | Freq: Three times a day (TID) | INTRAMUSCULAR | Status: DC
  Administered 2023-11-23: 7 [IU] via SUBCUTANEOUS
  Administered 2023-11-24 (×2): 4 [IU] via SUBCUTANEOUS
  Administered 2023-11-25 (×2): 7 [IU] via SUBCUTANEOUS

## 2023-11-23 MED ORDER — ATORVASTATIN CALCIUM 40 MG PO TABS
40.0000 mg | ORAL_TABLET | Freq: Every day | ORAL | Status: DC
  Administered 2023-11-23 – 2023-11-24 (×2): 40 mg via ORAL
  Filled 2023-11-23 (×2): qty 1

## 2023-11-23 MED ORDER — CHLORHEXIDINE GLUCONATE CLOTH 2 % EX PADS
6.0000 | MEDICATED_PAD | Freq: Every day | CUTANEOUS | Status: DC
  Administered 2023-11-24 – 2023-11-25 (×2): 6 via TOPICAL

## 2023-11-23 MED ORDER — HEPARIN (PORCINE) 25000 UT/250ML-% IV SOLN
1800.0000 [IU]/h | INTRAVENOUS | Status: DC
  Administered 2023-11-23: 1400 [IU]/h via INTRAVENOUS
  Administered 2023-11-24: 1800 [IU]/h via INTRAVENOUS
  Filled 2023-11-23 (×2): qty 250

## 2023-11-23 MED ORDER — GABAPENTIN 100 MG PO CAPS
100.0000 mg | ORAL_CAPSULE | Freq: Three times a day (TID) | ORAL | Status: DC
  Administered 2023-11-23 – 2023-11-25 (×6): 100 mg via ORAL
  Filled 2023-11-23 (×7): qty 1

## 2023-11-23 MED ORDER — APIXABAN 5 MG PO TABS
5.0000 mg | ORAL_TABLET | Freq: Two times a day (BID) | ORAL | Status: DC
Start: 2023-11-23 — End: 2023-11-23

## 2023-11-23 NOTE — ED Notes (Signed)
 RN applied abx ointment to fistula.

## 2023-11-23 NOTE — Consult Note (Signed)
 [facility] Renal Consultation Note    Indication for Consultation:  Management of ESRD/hemodialysis, anemia, hypertension/volume, and secondary hyperparathyroidism.  HPI: [name] is a 68 y.o. male with PMH including ERD on dialysis, DM, CAD, HTN, PAD. Patient was just discharged on 11/20/23 after admission gor bradycardia. Labetalol, hydralazine and amlodipine were stopped. He presented to the ED from his SNF with dizziness, bradycardia and SOB. His HR was 40's, BP 106/51, O2 sat 89% on RA but resolved on 2L [place]. He did nto attend dialysis today due to fatigue/SOB. Cardiology was consulted for evaluation of bradycardia. Labs today ntoable for Na 135 K+ 4.0 BUN 32 Cr 5.5 Alb 2.9 WBC 5.8 Hgb 9.5 Plt 118. CXR showed likely small pleural effusions, no pulmonary edema. Pt reports his symptoms resolved when he left the hospital but returned this AM. He is not sure if it was before or after AM meds were given. Reports fatigue, weakness. No chest pain at present. Denies dizziness, HA, blurry vision, SOB. Reports mild nausea but no vomiting. No recent fevers or chills. Reports some peripheral edema but states he always has this unless he has his stump shrinker on.    Of note, he does have some redness around his AVF. Reports it started a few weeks ago and is itchy. No warmth, pain or drainage. Most likely contact dermatitis, may need to try alternate numbing agent pre-HD.   Past Medical History:  Diagnosis Date   Carotid artery occlusion 11/10/2010   LEFT CAROTID ENDARTERECTOMY   Complication of anesthesia    BP WENT UP AT [facility] "   COPD (chronic obstructive pulmonary disease) (HCC)    pt denies this dx as of 06/01/20 - no inhaler    Diabetes mellitus without complication (HCC)    Diverticulitis    Diverticulosis of colon (without mention of hemorrhage)    DJD (degenerative joint disease)    knees/hands/feet/back/neck   ESRD (end stage renal disease) on dialysis (HCC)    Fatty liver     Full dentures    GERD (gastroesophageal reflux disease)    H/O hiatal hernia    History of blood transfusion    with a past surical procedure per patient 06/01/20   Hyperlipidemia    Hypertension    Neuromuscular disorder (HCC)    peripheral neuropathy   Non-pressure chronic ulcer of other part of left foot limited to breakdown of skin (HCC) 11/12/2016   Osteomyelitis (HCC)    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 Atherectomy left popliteal artery July 2012.    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   Unstable angina (HCC) 09/16/2018   Wears glasses    Past Surgical History:  Procedure Laterality Date   ABDOMINAL AORTOGRAM W/LOWER EXTREMITY N/A 06/23/2021   Procedure: ABDOMINAL AORTOGRAM W/LOWER EXTREMITY;  Surgeon: Elder Negus, MD;  [facility]: [facility];  Service: Cardiovascular;  Laterality: N/A;   AMPUTATION  11/05/2011   Procedure: AMPUTATION RAY;  Surgeon: Toni Arthurs, MD;  Location: [facility] OR;  Service: Orthopedics;  Laterality: Right;  Amputation of Right 4&5th Toes   AMPUTATION Left 11/26/2012   Procedure: AMPUTATION RAY;  Surgeon: Toni Arthurs, MD;  Location: [facility] OR;  Service: Orthopedics;  Laterality: Left;  fourth ray amputation   AMPUTATION  Right 08/27/2014   Procedure: Transmetatarsal Amputation;  Surgeon: Nadara Mustard, MD;  Location: [facility] OR;  Service: Orthopedics;  Laterality: Right;   AMPUTATION Right 01/14/2015   Procedure: AMPUTATION BELOW KNEE;  Surgeon: Nadara Mustard, MD;  Location: [facility] OR;  Service: Orthopedics;  Laterality: Right;   AMPUTATION Left 10/21/2015   Procedure: Left Foot 5th Ray Amputation;  Surgeon: Nadara Mustard, MD;  Location: [facility] OR;  Service: Orthopedics;  Laterality: Left;   AMPUTATION Left 07/04/2022   Procedure: LEFT BELOW KNEE AMPUTATION;  Surgeon: Nadara Mustard, MD;  Location: [facility] OR;  Service: Orthopedics;  Laterality: Left;   ANTERIOR FUSION CERVICAL SPINE  02/06/06   C4-5, C5-6, C6-7; SURGEON DR. MAX COHEN   AV FISTULA PLACEMENT Left 06/02/2020   Procedure: ARTERIOVENOUS (AV) FISTULA CREATION LEFT;  Surgeon: Maeola Harman, MD;  Location: [facility] OR;  Service: Vascular;  Laterality: Left;   BACK SURGERY     x 3   BASCILIC VEIN TRANSPOSITION Left 07/21/2020   Procedure: LEFT UPPER ARM ATERIOVENOUS SUPERFISTULALIZATION;  Surgeon: Maeola Harman, MD;  Location: [facility] OR;  Service: Vascular;  Laterality: Left;   BELOW KNEE LEG AMPUTATION Right    CARDIAC CATHETERIZATION  10/31/04   2009   CAROTID ENDARTERECTOMY  11/10/10   CAROTID ENDARTERECTOMY Left 11/10/2010   Subtotal occlusion of left internal carotid artery with left hemispheric transient ischemic attacks.   CAROTID STENT     CARPAL TUNNEL RELEASE Right 10/21/2013   Procedure: RIGHT CARPAL TUNNEL RELEASE;  Surgeon: Nicki Reaper, MD;  Location: [facility];  Service: Orthopedics;  Laterality: Right;   CHOLECYSTECTOMY     COLON SURGERY     COLONOSCOPY     COLOSTOMY REVERSAL  05/21/2018   ileostomy reversal   CYSTOSCOPY WITH STENT PLACEMENT Bilateral 01/13/2018   Procedure: CYSTOSCOPY WITH BILATERAL URETERAL CATHETER PLACEMENT;  Surgeon: Crist Fat, MD;  Location: [facility] ORS;  Service: Urology;  Laterality: Bilateral;   ESOPHAGEAL MANOMETRY Bilateral 07/19/2014   Procedure: ESOPHAGEAL MANOMETRY (EM);  Surgeon: Beverley Fiedler, MD;  Location: [facility];  Service: Gastroenterology;  Laterality: Bilateral;   EYE SURGERY Bilateral 2020   cataract   FEMORAL ARTERY STENT     x6   FINGER SURGERY     FOOT SURGERY  04/25/2016    EXCISION BASE 5TH METATARSAL AND PARTIAL CUBOID LEFT FOOT   HERNIA REPAIR     LEFT INGUINAL AND UMBILICAL REPAIRS   HERNIA REPAIR     I & D EXTREMITY Left 04/25/2016   Procedure: EXCISION BASE 5TH METATARSAL AND PARTIAL CUBOID LEFT FOOT;   Surgeon: Nadara Mustard, MD;  [facility]: [facility];  Service: Orthopedics;  Laterality: Left;   ILEOSTOMY  01/13/2018   Procedure: ILEOSTOMY;  Surgeon: Berna Bue, MD;  Location: [facility];  Service: General;;   ILEOSTOMY CLOSURE N/A 05/21/2018   Procedure: ILEOSTOMY REVERSAL ERAS PATHWAY;  Surgeon: Berna Bue, MD;  Location: [facility];  Service: General;  Laterality: N/A;   IR RADIOLOGIST EVAL & MGMT  11/19/2017   IR RADIOLOGIST EVAL & MGMT  12/03/2017   IR RADIOLOGIST EVAL & MGMT  12/18/2017   JOINT REPLACEMENT Right 2001   Total knee   LAMINECTOMY     X 3 LUMBAR AND X 2 CERVICAL SPINE OPERATIONS   LAPAROSCOPIC CHOLECYSTECTOMY W/ CHOLANGIOGRAPHY  11/09/04   SURGEON DR. Caleen Essex   LEFT HEART CATH AND CORONARY ANGIOGRAPHY N/A 09/16/2018   Procedure: LEFT HEART CATH  AND CORONARY ANGIOGRAPHY;  Surgeon: Elder Negus, MD;  [facility];  Service: Cardiovascular;  Laterality: N/A;   LEFT HEART CATHETERIZATION WITH CORONARY ANGIOGRAM N/A 10/29/2014   Procedure: LEFT HEART CATHETERIZATION WITH CORONARY ANGIOGRAM;  Surgeon: Pamella Pert, MD;  Location: [facility] CATH LAB;  Service: Cardiovascular;  Laterality: N/A;   LIGATION OF COMPETING BRANCHES OF ARTERIOVENOUS FISTULA Left 07/21/2020   Procedure: LIGATION OF COMPETING BRANCHES OF LEFT UPPER ARM ARTERIOVENOUS FISTULA;  Surgeon: Maeola Harman, MD;  Location: [facility] OR;  Service: Vascular;  Laterality: Left;   LOWER EXTREMITY ANGIOGRAM N/A 03/19/2012   Procedure: LOWER EXTREMITY ANGIOGRAM;  Surgeon: Kathleene Hazel, MD;  Location: [facility] CATH LAB;  Service: Cardiovascular;  Laterality: N/A;   LOWER EXTREMITY ANGIOGRAPHY N/A 06/20/2021   Procedure: LOWER EXTREMITY ANGIOGRAPHY;  Surgeon: Elder Negus, MD;  [facility];  Service: Cardiovascular;  Laterality: N/A;   NECK SURGERY     PARTIAL COLECTOMY N/A 01/13/2018   Procedure: LAPAROSCOPIC ASSISTED   SIGMOID COLECTOMY ILEOSTOMY;  Surgeon: Berna Bue, MD;  Location: [facility];  Service: General;  Laterality: N/A;   PENILE PROSTHESIS IMPLANT  08/14/05   INFRAPUBIC INSERTION OF INFLATABLE PENILE PROSTHESIS; SURGEON DR. Logan Bores   PENILE PROSTHESIS IMPLANT     PERCUTANEOUS CORONARY STENT INTERVENTION (PCI-S) Right 10/29/2014   Procedure: PERCUTANEOUS CORONARY STENT INTERVENTION (PCI-S);  Surgeon: Pamella Pert, MD;  Location: [facility] CATH LAB;  Service: Cardiovascular;  Laterality: Right;   PERIPHERAL VASCULAR INTERVENTION Left 06/23/2021   Procedure: PERIPHERAL VASCULAR INTERVENTION;  Surgeon: Elder Negus, MD;  [facility];  Service: Cardiovascular;  Laterality: Left;   REMOVAL OF PENILE PROSTHESIS N/A 06/14/2021   Procedure: Removal of THREE piece inflatable penile prosthesis;  Surgeon: Crista Elliot, MD;  Location: [facility] OR;  Service: Urology;  Laterality: N/A;   SHOULDER ARTHROSCOPY     SPINE SURGERY     TOE AMPUTATION Left    TONSILLECTOMY     TOTAL KNEE ARTHROPLASTY  07/2002   RIGHT KNEE ; SURGEON  DR. [name] ALSO HAD ARTHROSCOPIC RIGHT KNEE IN  10/2001   TOTAL KNEE ARTHROPLASTY     ULNAR NERVE TRANSPOSITION Right 10/21/2013   Procedure: RIGHT ELBOW  ULNAR NERVE DECOMPRESSION;  Surgeon: Nicki Reaper, MD;  Location: [facility];  Service: Orthopedics;  Laterality: Right;   Family History  Problem Relation Age of Onset   Heart disease Father        Before age 31-  CAD, BPG   Diabetes Father        Amputation   Cancer Father        PROSTATE   Hyperlipidemia Father    Hypertension Father    Heart attack Father        Triple BPG   Varicose Veins Father    Cancer Sister        Breast   Hyperlipidemia Sister    Hypertension Sister    Heart attack Brother    Colon cancer Brother    Diabetes Brother    Heart disease Brother 48       A-Fib. Before age 44   Hyperlipidemia Brother    Hypertension Brother    Hypertension Son    Arthritis Other        GRANDMOTHER   Hypertension Other         OTHER FAMILY MEMBERS   Social History:  reports that he has been smoking  cigarettes. He has a 17.5 pack-year smoking history. He has never used smokeless tobacco. He reports that he does not currently use alcohol. He reports that he does not use drugs.  ROS: As per HPI otherwise negative.   Physical Exam: Vitals:   11/23/23 1230 11/23/23 1346 11/23/23 1413 11/23/23 1415  BP: (!) 109/41 (!) 121/45 (!) 118/49 (!) 113/51  Pulse: (!) 46 (!) 48 (!) 45 (!) 45  Resp: 18 17 16 18   Temp:  98.8 F (37.1 C)    TempSrc:      SpO2: 100% 100% 99% 100%  Weight:      Height:         General: Well developed, well nourished, alert male in no acute distress. Head: Normocephalic, atraumatic, sclera non-icteric, mucus membranes are moist. Lungs: Clear bilaterally to auscultation without wheezes, rales, or rhonchi. Breathing is unlabored. Heart: bradycardic with normal S1, S2. No murmurs, rubs, or gallops appreciated. Abdomen: Soft, non-distended with normoactive bowel sounds.  Lower extremities: 2+ stump edema bilaterally Neuro: Alert and oriented X 3. Moves all extremities spontaneously. Psych:  Responds to questions appropriately with a normal affect. Dialysis Access: LUE AVF + t/b, mild erythema surrounding fistula, no redness/drainage  Allergies  Allergen Reactions   Contrast Media [Iodinated Contrast Media] Shortness Of Breath and Other (See Comments)    Difficulty breathing and altered mental status     Ivp Dye [Iodinated Contrast Media] Anaphylaxis, Shortness Of Breath and Other (See Comments)    Breathing problems, altered mental state    Latex Rash and Other (See Comments)    A severe rash appears after the first 24 hours of being placed   Tape Other (See Comments) and Rash    Rash after 1 day of use  Do not leave on longer then 3 days with out be changed   Prior to Admission medications   Medication Sig Start Date End Date Taking? Authorizing Provider  amiodarone  (PACERONE) 200 MG tablet Take 1 tablet (200 mg total) by mouth daily. Patient taking differently: Take 200 mg by mouth in the morning. 10/29/22   Yates Decamp, MD  amLODipine (NORVASC) 10 MG tablet Take 10 mg by mouth in the morning.    [provider]  apixaban (ELIQUIS) 5 MG TABS tablet Take 1 tablet (5 mg total) by mouth 2 (two) times daily. 01/14/22 03/09/24  Almon Hercules, MD  atorvastatin (LIPITOR) 40 MG tablet Take 40 mg by mouth at bedtime.     [provider]  B Complex-C-Zn-Folic Acid (DIALYVITE/ZINC) TABS Take 1 tablet by mouth Every Tuesday,Thursday,and Saturday with dialysis. 10/04/21   [provider]  famotidine (PEPCID) 20 MG tablet Take 20 mg by mouth every other day.    [provider]  fentaNYL (DURAGESIC) 50 MCG/HR 1 patch every 3 (three) days. 07/25/23   [provider]  folic acid (FOLVITE) 1 MG tablet Take 1 mg by mouth in the morning. 09/18/21   [provider]  gabapentin (NEURONTIN) 100 MG capsule Take 100 mg by mouth 3 (three) times daily.    [provider]  hydrALAZINE (APRESOLINE) 50 MG tablet Take 50 mg by mouth every 8 (eight) hours.    [provider]  Icosapent Ethyl 0.5 g CAPS Take 1 capsule by mouth in the morning.    [provider]  insulin aspart (NOVOLOG FLEXPEN) 100 UNIT/ML FlexPen Inject 0-12 Units into the skin See admin instructions. Inject 4 units into the skin with meals in addition to  sliding scale as needed. Inject as per sliding scale: If 0 - 69 = 0 initiate hypoglycemia orders;  70 - 150 = 0;  151 - 200 = 2;  201 - 250 = 4;  251 - 300 = 6;  301 - 350 = 8;  351 - 400 = 10;  401 - 450 = 12.  If >400, give 12 units, recheck in 1 hour. If still >400, call MD/NP for further orders. Subcutaneously with meals in addition to scheduled 4 unites with meals.    [provider]  insulin aspart (NOVOLOG) 100 UNIT/ML injection Inject 4 Units into the skin 3 (three) times daily  with meals.    [provider]  isosorbide mononitrate (IMDUR) 60 MG 24 hr tablet Take 1 tablet (60 mg total) by mouth daily. Patient taking differently: Take 60 mg by mouth in the morning. 01/21/23 01/08/24  Yates Decamp, MD  lidocaine-prilocaine (EMLA) cream Apply 1 application topically See admin instructions. Prior to Dialysis days Tuesday,Thursday and saturday 06/06/21   [provider]  liver oil-zinc oxide (DESITIN) 40 % ointment Apply 1 Application topically as needed for irritation.    [provider]  loratadine (CLARITIN) 10 MG tablet Take 10 mg by mouth in the morning.    [provider]  midodrine (PROAMATINE) 10 MG tablet Take 10 mg by mouth daily as needed (SBP less than 100.).    [provider]  mineral oil-hydrophilic petrolatum (AQUAPHOR) ointment Apply 1 Application topically as needed for dry skin or irritation (Upper thigh).    [provider]  nitroGLYCERIN (NITROSTAT) 0.4 MG SL tablet Place 0.4 mg under the tongue every 5 (five) minutes as needed for chest pain. 01/06/22   [provider]  oxyCODONE-acetaminophen (PERCOCET/ROXICET) 5-325 MG tablet Take 1 tablet by mouth every 8 (eight) hours as needed for severe pain. 05/31/23   Ghimire, Werner Lean, MD  promethazine (PHENERGAN) 12.5 MG suppository Place 12.5 mg rectally daily as needed for nausea or vomiting.    [provider]  rOPINIRole (REQUIP) 2 MG tablet Take 2 mg by mouth at bedtime.    [provider]  tamsulosin (FLOMAX) 0.4 MG CAPS capsule Take 0.4 mg by mouth in the morning.    [provider]   No current facility-administered medications for this encounter.   Current Outpatient Medications  Medication Sig Dispense Refill   amiodarone (PACERONE) 200 MG tablet Take 1 tablet (200 mg total) by mouth daily. (Patient taking differently: Take 200 mg by mouth in the morning.) 100 tablet 1   [Paused] amLODipine (NORVASC) 10 MG tablet Take  10 mg by mouth in the morning.     apixaban (ELIQUIS) 5 MG TABS tablet Take 1 tablet (5 mg total) by mouth 2 (two) times daily. 60 tablet 1   atorvastatin (LIPITOR) 40 MG tablet Take 40 mg by mouth at bedtime.      B Complex-C-Zn-Folic Acid (DIALYVITE/ZINC) TABS Take 1 tablet by mouth Every Tuesday,Thursday,and Saturday with dialysis.     famotidine (PEPCID) 20 MG tablet Take 20 mg by mouth every other day.     fentaNYL (DURAGESIC) 50 MCG/HR 1 patch every 3 (three) days.     folic acid (FOLVITE) 1 MG tablet Take 1 mg by mouth in the morning.     gabapentin (NEURONTIN) 100 MG capsule Take 100 mg by mouth 3 (three) times daily.     [Paused] hydrALAZINE (APRESOLINE) 50 MG tablet Take 50 mg by mouth every 8 (eight) hours.  Icosapent Ethyl 0.5 g CAPS Take 1 capsule by mouth in the morning.     insulin aspart (NOVOLOG FLEXPEN) 100 UNIT/ML FlexPen Inject 0-12 Units into the skin See admin instructions. Inject 4 units into the skin with meals in addition to sliding scale as needed. Inject as per sliding scale: If 0 - 69 = 0 initiate hypoglycemia orders;  70 - 150 = 0;  151 - 200 = 2;  201 - 250 = 4;  251 - 300 = 6;  301 - 350 = 8;  351 - 400 = 10;  401 - 450 = 12.  If >400, give 12 units, recheck in 1 hour. If still >400, call MD/NP for further orders. Subcutaneously with meals in addition to scheduled 4 unites with meals.     insulin aspart (NOVOLOG) 100 UNIT/ML injection Inject 4 Units into the skin 3 (three) times daily with meals.     [Paused] isosorbide mononitrate (IMDUR) 60 MG 24 hr tablet Take 1 tablet (60 mg total) by mouth daily. (Patient taking differently: Take 60 mg by mouth in the morning.) 90 tablet 1   lidocaine-prilocaine (EMLA) cream Apply 1 application topically See admin instructions. Prior to Dialysis days Tuesday,Thursday and saturday     liver oil-zinc oxide (DESITIN) 40 % ointment Apply 1 Application topically as needed for irritation.     loratadine (CLARITIN) 10 MG tablet  Take 10 mg by mouth in the morning.     midodrine (PROAMATINE) 10 MG tablet Take 10 mg by mouth daily as needed (SBP less than 100.).     mineral oil-hydrophilic petrolatum (AQUAPHOR) ointment Apply 1 Application topically as needed for dry skin or irritation (Upper thigh).     nitroGLYCERIN (NITROSTAT) 0.4 MG SL tablet Place 0.4 mg under the tongue every 5 (five) minutes as needed for chest pain.     oxyCODONE-acetaminophen (PERCOCET/ROXICET) 5-325 MG tablet Take 1 tablet by mouth every 8 (eight) hours as needed for severe pain. 15 tablet 0   promethazine (PHENERGAN) 12.5 MG suppository Place 12.5 mg rectally daily as needed for nausea or vomiting.     rOPINIRole (REQUIP) 2 MG tablet Take 2 mg by mouth at bedtime.     tamsulosin (FLOMAX) 0.4 MG CAPS capsule Take 0.4 mg by mouth in the morning.     Labs: Basic Metabolic Panel: Recent Labs  Lab 11/19/23 1359 11/19/23 1436 11/20/23 0444 11/23/23 1138 11/23/23 1153  NA 132*   < > 134* 135 135  K 5.2*   < > 5.6* 4.1 4.0  CL 95*   < > 96* 98 102  CO2 25  --  26 22  --   GLUCOSE 129*   < > 120* 163* 160*  BUN 43*   < > 55* 28* 32*  CREATININE 6.39*   < > 7.13* 5.08* 5.50*  CALCIUM 7.8*  --  7.9* 7.4*  --    < > = values in this interval not displayed.   Liver Function Tests: Recent Labs  Lab 11/19/23 1359 11/23/23 1138  AST 15 20  ALT 14 11  ALKPHOS 75 74  BILITOT 0.6 0.6  PROT 5.8* 5.2*  ALBUMIN 3.0* 2.9*   Recent Labs  Lab 11/19/23 1359  LIPASE 23   No results for input(s): "AMMONIA" in the last 168 hours. CBC: Recent Labs  Lab 11/19/23 1359 11/19/23 1436 11/19/23 2255 11/23/23 1138 11/23/23 1153  WBC 5.0  --  5.4 5.8  --   NEUTROABS 3.3  --   --  4.3  --   HGB 9.9*   < > 10.2* 9.3* 9.5*  HCT 30.1*   < > 31.5* 29.6* 28.0*  MCV 96.5  --  97.5 100.7*  --   PLT 146*  --  148* 118*  --    < > = values in this interval not displayed.   Cardiac Enzymes: No results for input(s): "CKTOTAL", "CKMB", "CKMBINDEX",  "TROPONINI" in the last 168 hours. CBG: Recent Labs  Lab 11/19/23 2242 11/20/23 1354  GLUCAP 194* 161*   Iron Studies: No results for input(s): "IRON", "TIBC", "TRANSFERRIN", "FERRITIN" in the last 72 hours. Studies/Results: DG Chest Port 1 View Result Date: 11/23/2023 CLINICAL DATA:  Weakness and chest pain. EXAM: PORTABLE CHEST 1 VIEW COMPARISON:  11/19/2023 and 09/22/2023.  CT, 07/18/2023. FINDINGS: Cardiac silhouette borderline enlarged. No mediastinal or hilar masses. Hazy lung base opacities are noted accentuated by relatively low lung volumes. Remainder of the lungs is clear. Suspect small effusions. No pneumothorax. Skeletal structures are grossly intact. IMPRESSION: Low lung volumes with hazy lung base opacities suspected to be small effusions with atelectasis. Cannot exclude pneumonia. No pulmonary edema. Electronically Signed   By: Amie Portland M.D.   On: 11/23/2023 11:57    Dialysis Orders:  [facility] TTS 4:15 450/800 EDW 107kg 2K/2Ca  AVF No heparin Not reaching EDW. Post HD wt 3/8 - 117kg -Mircera 150 q 2 wks (last dose 11/21/23) -Hectorol 2 q HD  Assessment/Plan:  Sinus bradycardia; Recent admission for the same. Beta blocker was discontinued. Cardiology is on board. On midodrine pre-HD, will hold for now until bradycardia improves.   ESRD:  Typically on TTS schedule, missed today due to above events. No emergent indication for HD at present, K+ is 4.0. Will plan for HD tonight/tomorrow as schedule allows.   Hypertension/volume: BP soft/stable. He does have stump edema which he reports is chronic, not reaching his EDW lately. UF goal 3L as tolerated.   Anemia: Hgb 9.5, received mircera on 3/13.   Metabolic bone disease: Continue VDRA and binders. Corrected Calcium at goal, will follow phos.   Rogers Blocker, PA-C 11/23/2023, 2:26 PM  [facility] Pager: [phone]

## 2023-11-23 NOTE — H&P (Signed)
 Date: 11/23/2023         Patient Name:  [name] MRN: [id number]  DOB: 1956/06/25 Age / Sex: 68 y.o., male   PCP: Lonie Peak, PA-C         Medical Service: Internal Medicine Teaching Service         Attending Physician: Dr. Gust Rung, DO    First Contact: Dr. Kathleen Lime, MD Pager [phone] Pager: [phone]  Second Contact: Dr. Marrianne Mood, MD Pager [phone] Pager: [phone]       After Hours (After 5p/  First Contact Pager: [phone]  weekends / holidays): Second Contact Pager: [phone]   Chief Concern: Refractory Bradycardia and hypotension   History of Present Illness: [name] is a 68yo man with T2DM, ESRD (TTS HD), HFpEF, Afib, PAD s/p b/l BKA who presents with hypotension and bradycardia from his nursing home.  Of notes , this patient is a bounce back who got discharged last Wednesday. He was discharged after being admitted for the same reason right about 45 minutes into his dialysis session.During that time, his bradycardia and hypotensive resolved after stopping his betablocker.  Today, he presents with similar symptoms right before his transport to his dialysis center. He said he felt "woozy" and felt weak. Patient reports he felt great after leaving this hospital and hasn't had any issues until this morning. He also reported a heaviness in his chest but doesn't endorse any chest pain whatsoever.    Allergies: Allergies  Allergen Reactions   Contrast Media [Iodinated Contrast Media] Shortness Of Breath and Other (See Comments)    Difficulty breathing and altered mental status     Ivp Dye [Iodinated Contrast Media] Anaphylaxis, Shortness Of Breath and Other (See Comments)    Breathing problems, altered mental state    Latex Rash and Other (See Comments)    A severe rash appears after the first 24 hours of being placed   Tape Other (See Comments) and Rash    Rash after 1 day of use  Do not leave on longer then 3 days with out be changed      Past Medical History: Patient Active Problem List   Diagnosis Date Noted   Hypervolemia 11/20/2023   Bradycardia 11/19/2023   Rash and nonspecific skin eruption 11/19/2023   C. difficile colitis 09/25/2023   SIRS (systemic inflammatory response syndrome) (HCC) 09/22/2023   Hyperkalemia 07/19/2023   ESRD on dialysis (HCC) 07/19/2023   Severe sepsis (HCC) 05/28/2023   Acute encephalopathy 05/28/2023   Acute respiratory failure with hypoxia (HCC) 05/28/2023   Precordial pain 03/31/2023   History of right coronary artery stent placement 03/31/2023   Current use of long term anticoagulation 03/31/2023   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) 03/31/2023   PAF (paroxysmal atrial fibrillation) (HCC) 03/31/2023   Hypertensive crisis 03/31/2023   Unstable angina (HCC) 03/30/2023   Tobacco abuse 03/30/2023   Prolonged QT interval 03/30/2023   Elevated troponin 03/30/2023   Cellulitis 11/20/2022   Chest pain 07/04/2022   S/P BKA (below knee amputation) unilateral, left (HCC) 07/04/2022   Subacute osteomyelitis, left ankle and foot (HCC) 07/03/2022   Malaise 01/13/2022   Anemia of renal disease 01/13/2022   Obesity (BMI 30-39.9) 01/13/2022   Decubitus ulcer of buttock 01/13/2022   Intractable nausea and vomiting with epigastric pain 01/11/2022   Chronic osteomyelitis (HCC) 01/11/2022   Proliferative diabetic retinopathy of left eye with macular edema associated with type 2 diabetes mellitus (HCC)  11/27/2021   Vitreous hemorrhage of right eye (HCC) 11/27/2021   Sepsis (HCC) 09/02/2021   Severe sepsis with lactic acidosis (HCC) 06/14/2021   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) 06/14/2021   ESRD on hemodialysis with hyperkalemia 06/14/2021   Paroxysmal atrial fibrillation with RVR (HCC) 06/14/2021   Carotid stenosis 06/15/2020   Dyslipidemia 06/15/2020   Diabetic macular edema of right eye with proliferative retinopathy associated  with type 2 diabetes mellitus (HCC) 01/18/2020   Severe nonproliferative diabetic retinopathy of left eye, with macular edema, associated with type 2 diabetes mellitus (HCC) 01/04/2020   Retinal hemorrhage of right eye 01/04/2020   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   Pseudoclaudication 11/15/2018   S/P BKA (below knee amputation), right (HCC) 09/18/2018   Amputation of toe of left foot (HCC) 09/18/2018   Peripheral artery disease (HCC) 09/18/2018   S/P carotid endarterectomy 09/18/2018   OSA not tolerating CPAP 09/18/2018   Chronic pain 09/18/2018   Status post reversal of ileostomy 05/21/2018   Normocytic anemia 02/14/2018   Intra-abdominal abscess (HCC) 11/04/2017   Chronic venous hypertension (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 (HCC) 12/11/2016   PAD (peripheral artery disease) (HCC) 11/12/2016   Unilateral primary osteoarthritis, left knee 10/11/2016   History of right below knee amputation (HCC) 07/12/2016   Type 2 diabetes mellitus with diabetic neuropathy (HCC) 05/09/2016   Infected diabetic left foot ulcer with chronic osteomyelitis    Diabetic foot infection (HCC) 05/04/2016   Insulin dependent type 2 diabetes mellitus (HCC) 12/16/2015   Amputated toe (HCC) 10/21/2015   Cellulitis of left lower extremity 09/03/2015   Leg edema, left 09/03/2015   CAD (dz of distal, mid and proximal RCA with implantation of 3 overlapping drug-eluting stent, with elevated troponin 09/03/2015   Chronic diastolic heart failure, NYHA class 2 (HCC) 09/03/2015   Hyperlipidemia 08/25/2014   Limb pain 03/20/2013   DJD (degenerative joint disease) 09/25/2012   Migraine 09/25/2012   Neuropathy 09/25/2012   Restless legs syndrome (RLS) 09/25/2012   Chronic obstructive pulmonary disease, unspecified (HCC) 04/25/2012   Unknown cause of morbidity or mortality 04/25/2012   Chronic total occlusion of artery of the extremities (HCC) 04/08/2012   Onychomycosis 02/01/2012    Occlusion and stenosis of carotid artery without mention of cerebral infarction 06/12/2011   Barrett's esophagus without dysplasia 05/08/2011   Former tobacco use 02/01/2011   Past Medical History:  Diagnosis Date   Carotid artery occlusion 11/10/2010   LEFT CAROTID ENDARTERECTOMY   Complication of anesthesia    BP WENT UP AT [facility] "   COPD (chronic obstructive pulmonary disease) (HCC)    pt denies this dx as of 06/01/20 - no inhaler    Diabetes mellitus without complication (HCC)    Diverticulitis    Diverticulosis of colon (without mention of hemorrhage)    DJD (degenerative joint disease)    knees/hands/feet/back/neck   ESRD (end stage renal disease) on dialysis (HCC)    Fatty liver    Full dentures    GERD (gastroesophageal reflux disease)    H/O hiatal hernia    History of blood transfusion    with a past surical procedure per patient 06/01/20   Hyperlipidemia    Hypertension    Neuromuscular disorder (HCC)    peripheral neuropathy   Non-pressure chronic ulcer of other part of left foot limited to breakdown of skin (HCC) 11/12/2016   Osteomyelitis (HCC)    left 5th metatarsal   PAD (peripheral artery disease) (HCC)  Distal aortogram June 2012. Atherectomy left popliteal artery July 2012.    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   Unstable angina (HCC) 09/16/2018   Wears glasses     Medications:  Current Facility-Administered Medications:    atorvastatin (LIPITOR) tablet 40 mg, 40 mg, Oral, QHS, Marrianne Mood, MD   Melene Muller ON 11/24/2023] Chlorhexidine Gluconate Cloth 2 % PADS 6 each, 6 each, Topical, Q0600, Collins, Samantha G, PA-C   [START ON 11/24/2023] famotidine (PEPCID) tablet 20 mg, 20 mg, Oral, QODAY, Marrianne Mood, MD   gabapentin (NEURONTIN) capsule 100 mg, 100 mg, Oral, TID, Marrianne Mood, MD, 100 mg at 11/23/23 1541    heparin ADULT infusion 100 units/mL (25000 units/231mL), 1,400 Units/hr, Intravenous, Continuous, Hoffman, Erik C, DO   insulin aspart (novoLOG) injection 0-20 Units, 0-20 Units, Subcutaneous, TID WC, Marrianne Mood, MD, 7 Units at 11/23/23 1708   oxyCODONE-acetaminophen (PERCOCET/ROXICET) 5-325 MG per tablet 1 tablet, 1 tablet, Oral, Q8H PRN, Marrianne Mood, MD, 1 tablet at 11/23/23 1542   Surgical History: Past Surgical History:  Procedure Laterality Date   ABDOMINAL AORTOGRAM W/LOWER EXTREMITY N/A 06/23/2021   Procedure: ABDOMINAL AORTOGRAM W/LOWER EXTREMITY;  Surgeon: Elder Negus, MD;  [facility]: [facility];  Service: Cardiovascular;  Laterality: N/A;   AMPUTATION  11/05/2011   Procedure: AMPUTATION RAY;  Surgeon: Toni Arthurs, MD;  Location: [facility];  Service: Orthopedics;  Laterality: Right;  Amputation of Right 4&5th Toes   AMPUTATION Left 11/26/2012   Procedure: AMPUTATION RAY;  Surgeon: Toni Arthurs, MD;  Location: [facility];  Service: Orthopedics;  Laterality: Left;  fourth ray amputation   AMPUTATION Right 08/27/2014   Procedure: Transmetatarsal Amputation;  Surgeon: Nadara Mustard, MD;  Location: [facility] OR;  Service: Orthopedics;  Laterality: Right;   AMPUTATION Right 01/14/2015   Procedure: AMPUTATION BELOW KNEE;  Surgeon: Nadara Mustard, MD;  Location: [facility];  Service: Orthopedics;  Laterality: Right;   AMPUTATION Left 10/21/2015   Procedure: Left Foot 5th Ray Amputation;  Surgeon: Nadara Mustard, MD;  Location: [facility] OR;  Service: Orthopedics;  Laterality: Left;   AMPUTATION Left 07/04/2022   Procedure: LEFT BELOW KNEE AMPUTATION;  Surgeon: Nadara Mustard, MD;  Location: [facility] OR;  Service: Orthopedics;  Laterality: Left;   ANTERIOR FUSION CERVICAL SPINE  02/06/06   C4-5, C5-6, C6-7; SURGEON DR. MAX COHEN   AV FISTULA PLACEMENT Left 06/02/2020   Procedure: ARTERIOVENOUS (AV) FISTULA CREATION LEFT;  Surgeon: Maeola Harman, MD;  Location: [facility] OR;  Service: Vascular;   Laterality: Left;   BACK SURGERY     x 3   BASCILIC VEIN TRANSPOSITION Left 07/21/2020   Procedure: LEFT UPPER ARM ATERIOVENOUS SUPERFISTULALIZATION;  Surgeon: Maeola Harman, MD;  Location: [facility] OR;  Service: Vascular;  Laterality: Left;   BELOW KNEE LEG AMPUTATION Right    CARDIAC CATHETERIZATION  10/31/04   2009   CAROTID ENDARTERECTOMY  11/10/10   CAROTID ENDARTERECTOMY Left 11/10/2010   Subtotal occlusion of left internal carotid artery with left hemispheric transient ischemic attacks.   CAROTID STENT     CARPAL TUNNEL RELEASE Right 10/21/2013   Procedure: RIGHT CARPAL TUNNEL RELEASE;  Surgeon: Nicki Reaper, MD;  Location: [facility];  Service: Orthopedics;  Laterality: Right;   CHOLECYSTECTOMY     COLON SURGERY  COLONOSCOPY     COLOSTOMY REVERSAL  05/21/2018   ileostomy reversal   CYSTOSCOPY WITH STENT PLACEMENT Bilateral 01/13/2018   Procedure: CYSTOSCOPY WITH BILATERAL URETERAL CATHETER PLACEMENT;  Surgeon: Crist Fat, MD;  Location: [facility];  Service: Urology;  Laterality: Bilateral;   ESOPHAGEAL MANOMETRY Bilateral 07/19/2014   Procedure: ESOPHAGEAL MANOMETRY (EM);  Surgeon: Beverley Fiedler, MD;  Location: [facility];  Service: Gastroenterology;  Laterality: Bilateral;   EYE SURGERY Bilateral 2020   cataract   FEMORAL ARTERY STENT     x6   FINGER SURGERY     FOOT SURGERY  04/25/2016    EXCISION BASE 5TH METATARSAL AND PARTIAL CUBOID LEFT FOOT   HERNIA REPAIR     LEFT INGUINAL AND UMBILICAL REPAIRS   HERNIA REPAIR     I & D EXTREMITY Left 04/25/2016   Procedure: EXCISION BASE 5TH METATARSAL AND PARTIAL CUBOID LEFT FOOT;  Surgeon: Nadara Mustard, MD;  Location: [facility];  Service: Orthopedics;  Laterality: Left;   ILEOSTOMY  01/13/2018   Procedure: ILEOSTOMY;  Surgeon: Berna Bue, MD;  Location: [facility];  Service: General;;   ILEOSTOMY CLOSURE N/A 05/21/2018   Procedure: ILEOSTOMY REVERSAL ERAS PATHWAY;  Surgeon: Berna Bue, MD;  Location:  [facility] OR;  Service: General;  Laterality: N/A;   IR RADIOLOGIST EVAL & MGMT  11/19/2017   IR RADIOLOGIST EVAL & MGMT  12/03/2017   IR RADIOLOGIST EVAL & MGMT  12/18/2017   JOINT REPLACEMENT Right 2001   Total knee   LAMINECTOMY     X 3 LUMBAR AND X 2 CERVICAL SPINE OPERATIONS   LAPAROSCOPIC CHOLECYSTECTOMY W/ CHOLANGIOGRAPHY  11/09/04   SURGEON DR. Caleen Essex   LEFT HEART CATH AND CORONARY ANGIOGRAPHY N/A 09/16/2018   Procedure: LEFT HEART CATH AND CORONARY ANGIOGRAPHY;  Surgeon: Elder Negus, MD;  [facility];  Service: Cardiovascular;  Laterality: N/A;   LEFT HEART CATHETERIZATION WITH CORONARY ANGIOGRAM N/A 10/29/2014   Procedure: LEFT HEART CATHETERIZATION WITH CORONARY ANGIOGRAM;  Surgeon: Pamella Pert, MD;  Location: [facility] CATH LAB;  Service: Cardiovascular;  Laterality: N/A;   LIGATION OF COMPETING BRANCHES OF ARTERIOVENOUS FISTULA Left 07/21/2020   Procedure: LIGATION OF COMPETING BRANCHES OF LEFT UPPER ARM ARTERIOVENOUS FISTULA;  Surgeon: Maeola Harman, MD;  Location: [facility] OR;  Service: Vascular;  Laterality: Left;   LOWER EXTREMITY ANGIOGRAM N/A 03/19/2012   Procedure: LOWER EXTREMITY ANGIOGRAM;  Surgeon: Kathleene Hazel, MD;  Location: [facility] CATH LAB;  Service: Cardiovascular;  Laterality: N/A;   LOWER EXTREMITY ANGIOGRAPHY N/A 06/20/2021   Procedure: LOWER EXTREMITY ANGIOGRAPHY;  Surgeon: Elder Negus, MD;  [facility];  Service: Cardiovascular;  Laterality: N/A;   NECK SURGERY     PARTIAL COLECTOMY N/A 01/13/2018   Procedure: LAPAROSCOPIC ASSISTED   SIGMOID COLECTOMY ILEOSTOMY;  Surgeon: Berna Bue, MD;  Location: [facility];  Service: General;  Laterality: N/A;   PENILE PROSTHESIS IMPLANT  08/14/05   INFRAPUBIC INSERTION OF INFLATABLE PENILE PROSTHESIS; SURGEON DR. Logan Bores   PENILE PROSTHESIS IMPLANT     PERCUTANEOUS CORONARY STENT INTERVENTION (PCI-S) Right 10/29/2014   Procedure: PERCUTANEOUS CORONARY STENT INTERVENTION  (PCI-S);  Surgeon: Pamella Pert, MD;  Location: [facility] CATH LAB;  Service: Cardiovascular;  Laterality: Right;   PERIPHERAL VASCULAR INTERVENTION Left 06/23/2021   Procedure: PERIPHERAL VASCULAR INTERVENTION;  Surgeon: Elder Negus, MD;  [facility];  Service: Cardiovascular;  Laterality: Left;   REMOVAL OF PENILE  PROSTHESIS N/A 06/14/2021   Procedure: Removal of THREE piece inflatable penile prosthesis;  Surgeon: Crista Elliot, MD;  Location: [facility] OR;  Service: Urology;  Laterality: N/A;   SHOULDER ARTHROSCOPY     SPINE SURGERY     TOE AMPUTATION Left    TONSILLECTOMY     TOTAL KNEE ARTHROPLASTY  07/2002   RIGHT KNEE ; SURGEON  DR. [name] ALSO HAD ARTHROSCOPIC RIGHT KNEE IN  10/2001   TOTAL KNEE ARTHROPLASTY     ULNAR NERVE TRANSPOSITION Right 10/21/2013   Procedure: RIGHT ELBOW  ULNAR NERVE DECOMPRESSION;  Surgeon: Nicki Reaper, MD;  Location: [facility];  Service: Orthopedics;  Laterality: Right;     Family History:  Family History  Problem Relation Age of Onset   Heart disease Father        Before age 11-  CAD, BPG   Diabetes Father        Amputation   Cancer Father        PROSTATE   Hyperlipidemia Father    Hypertension Father    Heart attack Father        Triple BPG   Varicose Veins Father    Cancer Sister        Breast   Hyperlipidemia Sister    Hypertension Sister    Heart attack Brother    Colon cancer Brother    Diabetes Brother    Heart disease Brother 45       A-Fib. Before age 61   Hyperlipidemia Brother    Hypertension Brother    Hypertension Son    Arthritis Other        GRANDMOTHER   Hypertension Other        OTHER FAMILY MEMBERS     Social History:  Currently lives at a nursing home. Dependent on all his iADLs but can do few thing on his own like, make a phone call. He smokes 1 pack per day since his teenage years but has reduced it to about 0.5 pack a day now. Follows up with Lonie Peak PA for his  primary care needs. He elects to be FULL code at this time.   Physical Exam: Blood pressure (!) 109/41, pulse (!) 46, temperature 98.7 F (37.1 C), temperature source Oral, resp. rate 18, height 6\' 2"  (1.88 m), weight 111.1 kg, SpO2 100%.  Constitutional: well-appearing man, sitting in bed , in no acute distress HENT: normocephalic atraumatic, mucous membranes moist Cardiovascular: regular rate and rhythm, no m/r/g, no  JVD Pulmonary/Chest: normal work of breathing on room air, lungs clear to auscultation bilaterally. No crackles  Abdominal: soft, non-tender, non-distended. No fluid wave no asterixis Neurological: alert & oriented x 3 MSK: no gross abnormalities. No pitting edema.  Skin: warm and dry Psych: Normal mood and affect   EKG:  Sinus bradycardia with right bundle branch block and left anterior fascicular block  Labs:    Latest Ref Rng & Units 11/23/2023   11:53 AM 11/23/2023   11:38 AM 11/19/2023   10:55 PM  CBC  WBC 4.0 - 10.5 K/uL  5.8  5.4   Hemoglobin 13.0 - 17.0 g/dL 9.5  9.3  59.5   Hematocrit 39.0 - 52.0 % 28.0  29.6  31.5   Platelets 150 - 400 K/uL  118  148        Latest Ref Rng & Units 11/23/2023   11:53 AM 11/23/2023   11:38 AM 11/20/2023    4:44 AM  BMP  Glucose 70 - 99 mg/dL 188  416  606   BUN 8 - 23 mg/dL 32  28  55   Creatinine 0.61 - 1.24 mg/dL 3.01  6.01  0.93   Sodium 135 - 145 mmol/L 135  135  134   Potassium 3.5 - 5.1 mmol/L 4.0  4.1  5.6   Chloride 98 - 111 mmol/L 102  98  96   CO2 22 - 32 mmol/L  22  26   Calcium 8.9 - 10.3 mg/dL  7.4  7.9    Troponin 235 < 381   Images and other studies:  Imaging: DG Chest Port 1 View Result Date: 11/23/2023 CLINICAL DATA:  Weakness and chest pain. EXAM: PORTABLE CHEST 1 VIEW COMPARISON:  11/19/2023 and 09/22/2023.  CT, 07/18/2023. FINDINGS: Cardiac silhouette borderline enlarged. No mediastinal or hilar masses. Hazy lung base opacities are noted accentuated by relatively low lung volumes. Remainder of  the lungs is clear. Suspect small effusions. No pneumothorax. Skeletal structures are grossly intact. IMPRESSION: Low lung volumes with hazy lung base opacities suspected to be small effusions with atelectasis. Cannot exclude pneumonia. No pulmonary edema. Electronically Signed   By: Amie Portland M.D.   On: 11/23/2023 11:57      Assessment & Plan:  [name] is a 68 y.o. T2DM, ESRD (TTS HD), HFpEF, Afib, PAD s/p b/l BKA who presents with hypotension and bradycardia and admitted for further work up.  Principal Problem:   Bradycardia Active Problems:   Type 2 diabetes mellitus with diabetic neuropathy (HCC)   S/P BKA (below knee amputation), right (HCC)   Chronic pain   Chest pain   PAF (paroxysmal atrial fibrillation) (HCC)  #Bradycardia ESRD patient of HD here for a BP of 118/49 and a HR of 46.Presented with similar presentation few days ago, thought to be beta blocker induced and his labetalol was stopped.He reports he hasn't taken this at nursing home since discharged.I wonder why he is still hypotensive before his dialysis. Given his recurrent bradycardia after washout of the suspected medication, I think its reasonable to consult cardiology for further evaluation if this patient might need a pacemaker vs other surgical intervention    #Chest pain Continues to reports chest heaviness. Doesn't seem like ACS in nature but can't de definitely ruled out.Normal EF on ECHO in July 2024. EKG consistent with sinus bradycardia with right bundle branch block and left anterior fascicular block  . CMP reviewed,potassium WNL. Trops however elevated at 336 from 169 from last admission which is concerning to me. Paged cardiology and are concerned for a possible in-stent stenosis with sinus bradycardia and may benefit from left heart cath on Monday - Cardiology recs appreciated - F/U on cath on Monday   - Trend trops   #ESRD Nephrology consulted, plan is to dialyze  him within the 24 hrs. No  indication for  emergent dialysis at this time. Dr Jon Gills assistance greatly appreciated.   #Chronic diastolic heart failure Stable, does not appear to be in a low output state.  Volume management per HD  #Chronic pain #Peripheral Neuropathy Continue home Percocet  Continue home gabapentin   #PAF - Holding home Eliquis and initiating Heparin for a cath on Monday 3/17  #Bilateral BKA In setting of PAD and diabetes. Site looks clean, dry and intact    #Hypertension Holding antihypertensives for now.  Level of care: To the cardiac Diet: Regular diet IVF: N/A VTE:  Heparin Code: Full code Surrogate: Wife  Signed: Criss Alvine  Mickie Bail, MD 11/23/2023, 5:44 PM

## 2023-11-23 NOTE — Progress Notes (Signed)
 ANTICOAGULATION CONSULT NOTE  Pharmacy Consult for Heparin Indication: chest pain/ACS  Allergies  Allergen Reactions   Contrast Media [Iodinated Contrast Media] Shortness Of Breath and Other (See Comments)    Difficulty breathing and altered mental status     Ivp Dye [Iodinated Contrast Media] Anaphylaxis, Shortness Of Breath and Other (See Comments)    Breathing problems, altered mental state    Latex Rash and Other (See Comments)    A severe rash appears after the first 24 hours of being placed   Tape Other (See Comments) and Rash    Rash after 1 day of use  Do not leave on longer then 3 days with out be changed    Patient Measurements: Height: 6\' 2"  (188 cm) Weight: 111.1 kg (245 lb) IBW/kg (Calculated) : 82.2 Heparin Dosing Weight: 105.3 kg  Vital Signs: Temp: 98.8 F (37.1 C) (03/15 1346) Temp Source: Oral (03/15 1136) BP: 113/51 (03/15 1415) Pulse Rate: 45 (03/15 1415)  Labs: Recent Labs    11/23/23 1138 11/23/23 1153 11/23/23 1326  HGB 9.3* 9.5*  --   HCT 29.6* 28.0*  --   PLT 118*  --   --   CREATININE 5.08* 5.50*  --   TROPONINIHS 336*  --  381*    Estimated Creatinine Clearance: 16.8 mL/min (A) (by C-G formula based on SCr of 5.5 mg/dL (H)).   Medical History: Past Medical History:  Diagnosis Date   Carotid artery occlusion 11/10/2010   LEFT CAROTID ENDARTERECTOMY   Complication of anesthesia    BP WENT UP AT [facility] "   COPD (chronic obstructive pulmonary disease) (HCC)    pt denies this dx as of 06/01/20 - no inhaler    Diabetes mellitus without complication (HCC)    Diverticulitis    Diverticulosis of colon (without mention of hemorrhage)    DJD (degenerative joint disease)    knees/hands/feet/back/neck   ESRD (end stage renal disease) on dialysis (HCC)    Fatty liver    Full dentures    GERD (gastroesophageal reflux disease)    H/O hiatal hernia    History of blood transfusion    with a past surical procedure per patient 06/01/20    Hyperlipidemia    Hypertension    Neuromuscular disorder (HCC)    peripheral neuropathy   Non-pressure chronic ulcer of other part of left foot limited to breakdown of skin (HCC) 11/12/2016   Osteomyelitis (HCC)    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 Atherectomy left popliteal artery July 2012.    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 Patient is presenting with bradycardia. Heparin per pharmacy consult placed for chest pain/ACS.  Patient is on apixaban prior to arrival. Last dose 3/15am. Will require aPTT monitoring due to likely falsely high anti-Xa level secondary to DOAC use.  Hgb 9.5; plt 118  Goal of Therapy:  Heparin level 0.3-0.7 units/ml aPTT 66-102 seconds Monitor platelets by anticoagulation protocol: Yes   Plan:  No initial heparin bolus Start heparin infusion at 1400 units/hr at 10pm tonight Check aPTT & anti-Xa level in 8 hours and daily while on heparin Continue to monitor via aPTT until levels are correlated Continue to monitor H&H and platelets  Delmar Landau, PharmD, BCPS 11/23/2023 2:58 PM ED Clinical Pharmacist -  [phone]

## 2023-11-23 NOTE — Consult Note (Signed)
 Cardiology Consultation   Patient ID: [name] MRN: [id number]; DOB: April 23, 1956  Admit date: 11/23/2023 Date of Consult: 11/23/2023  PCP:  Lonie Peak, PA-C   [facility] Providers Cardiologist:  None        Patient Profile:   [name] is a 68 y.o. male with a hx of multiple co-morbidities, type 2 diabetes, ESRD, HFpEF, paroxysmal atrial fibrillation, PAD status post bilateral BKA who is being seen 11/23/2023 for the evaluation of sinus bradycardia and CP at the request of Dr. Mikey Bussing.  History of Present Illness:   [name] was recently admitted a few days ago.  He was coming in with hypotension and bradycardia to the 40s.  He had missed a dialysis session last Thursday and felt very short of breath prior to his planned dialysis on Saturday.  He underwent 45 minutes of dialysis and then the session was stopped due to hypotension. At that time he was on amiodarone and labetalol.  His labetalol was subsequently stopped.  His amiodarone was continued.  His antihypertensives were adjusted as well.  He was planned to stop hydralazine, labetalol and amlodipine at least for the short-term.  He now returns to the emergency room with sinus bradycardia in the 40s still.   He reports that he has consistent chest pressure.  He notes he had chest pressure prior to his last admission.  He also reports feeling dizzy along with this and having shortness of breath. In terms of his cardiac history, he had a normal EF in July 2024.  Normal RV function.  No significant valve disease.  He had a cardiac cath by Dr. Rosemary Holms showing nonobstructive CAD with patent RCA stents.  This was in the setting of hypertensive emergency. he has history of paroxysmal atrial fibrillation.  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  Labs Trop 336-> 381 Hgb 9.3, MCV 100 -macrocytic anemia Platelets 118 thrombocytopenia   Past  Medical History:  Diagnosis Date   Carotid artery occlusion 11/10/2010   LEFT CAROTID ENDARTERECTOMY   Complication of anesthesia    BP WENT UP AT [facility] "   COPD (chronic obstructive pulmonary disease) (HCC)    pt denies this dx as of 06/01/20 - no inhaler    Diabetes mellitus without complication (HCC)    Diverticulitis    Diverticulosis of colon (without mention of hemorrhage)    DJD (degenerative joint disease)    knees/hands/feet/back/neck   ESRD (end stage renal disease) on dialysis (HCC)    Fatty liver    Full dentures    GERD (gastroesophageal reflux disease)    H/O hiatal hernia    History of blood transfusion    with a past surical procedure per patient 06/01/20   Hyperlipidemia    Hypertension    Neuromuscular disorder (HCC)    peripheral neuropathy   Non-pressure chronic ulcer of other part of left foot limited to breakdown of skin (HCC) 11/12/2016   Osteomyelitis (HCC)    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 Atherectomy left popliteal artery July 2012.    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   Unstable angina (HCC) 09/16/2018   Wears glasses     Past Surgical  History:  Procedure Laterality Date   ABDOMINAL AORTOGRAM W/LOWER EXTREMITY N/A 06/23/2021   Procedure: ABDOMINAL AORTOGRAM W/LOWER EXTREMITY;  Surgeon: Elder Negus, MD;  [facility];  Service: Cardiovascular;  Laterality: N/A;   AMPUTATION  11/05/2011   Procedure: AMPUTATION RAY;  Surgeon: Toni Arthurs, MD;  Location: [facility] OR;  Service: Orthopedics;  Laterality: Right;  Amputation of Right 4&5th Toes   AMPUTATION Left 11/26/2012   Procedure: AMPUTATION RAY;  Surgeon: Toni Arthurs, MD;  Location: [facility] OR;  Service: Orthopedics;  Laterality: Left;  fourth ray amputation   AMPUTATION Right 08/27/2014   Procedure:  Transmetatarsal Amputation;  Surgeon: Nadara Mustard, MD;  Location[facility] OR;  Service: Orthopedics;  Laterality: Right;   AMPUTATION Right 01/14/2015   Procedure: AMPUTATION BELOW KNEE;  Surgeon: Nadara Mustard, MD;  Location: [facility] OR;  Service: Orthopedics;  Laterality: Right;   AMPUTATION Left 10/21/2015   Procedure: Left Foot 5th Ray Amputation;  Surgeon: Nadara Mustard, MD;  Location: [facility] OR;  Service: Orthopedics;  Laterality: Left;   AMPUTATION Left 07/04/2022   Procedure: LEFT BELOW KNEE AMPUTATION;  Surgeon: Nadara Mustard, MD;  Location: [facility] OR;  Service: Orthopedics;  Laterality: Left;   ANTERIOR FUSION CERVICAL SPINE  02/06/06   C4-5, C5-6, C6-7; SURGEON DR. MAX COHEN   AV FISTULA PLACEMENT Left 06/02/2020   Procedure: ARTERIOVENOUS (AV) FISTULA CREATION LEFT;  Surgeon: Maeola Harman, MD;  Location: [facility] OR;  Service: Vascular;  Laterality: Left;   BACK SURGERY     x 3   BASCILIC VEIN TRANSPOSITION Left 07/21/2020   Procedure: LEFT UPPER ARM ATERIOVENOUS SUPERFISTULALIZATION;  Surgeon: Maeola Harman, MD;  Location: [facility] OR;  Service: Vascular;  Laterality: Left;   BELOW KNEE LEG AMPUTATION Right    CARDIAC CATHETERIZATION  10/31/04   2009   CAROTID ENDARTERECTOMY  11/10/10   CAROTID ENDARTERECTOMY Left 11/10/2010   Subtotal occlusion of left internal carotid artery with left hemispheric transient ischemic attacks.   CAROTID STENT     CARPAL TUNNEL RELEASE Right 10/21/2013   Procedure: RIGHT CARPAL TUNNEL RELEASE;  Surgeon: Nicki Reaper, MD;  Location: [facility];  Service: Orthopedics;  Laterality: Right;   CHOLECYSTECTOMY     COLON SURGERY     COLONOSCOPY     COLOSTOMY REVERSAL  05/21/2018   ileostomy reversal   CYSTOSCOPY WITH STENT PLACEMENT Bilateral 01/13/2018   Procedure: CYSTOSCOPY WITH BILATERAL URETERAL CATHETER PLACEMENT;  Surgeon: Crist Fat, MD;  Location: [facility]S;  Service: Urology;  Laterality: Bilateral;   ESOPHAGEAL MANOMETRY Bilateral  07/19/2014   Procedure: ESOPHAGEAL MANOMETRY (EM);  Surgeon: Beverley Fiedler, MD;  Location: [facility];  Service: Gastroenterology;  Laterality: Bilateral;   EYE SURGERY Bilateral 2020   cataract   FEMORAL ARTERY STENT     x6   FINGER SURGERY     FOOT SURGERY  04/25/2016    EXCISION BASE 5TH METATARSAL AND PARTIAL CUBOID LEFT FOOT   HERNIA REPAIR     LEFT INGUINAL AND UMBILICAL REPAIRS   HERNIA REPAIR     I & D EXTREMITY Left 04/25/2016   Procedure: EXCISION BASE 5TH METATARSAL AND PARTIAL CUBOID LEFT FOOT;  Surgeon: Nadara Mustard, MD;  [facility]: [facility];  Service: Orthopedics;  Laterality: Left;   ILEOSTOMY  01/13/2018   Procedure: ILEOSTOMY;  Surgeon: Berna Bue, MD;  Location: [facility];  Service: General;;   ILEOSTOMY CLOSURE N/A 05/21/2018   Procedure: ILEOSTOMY REVERSAL ERAS  PATHWAY;  Surgeon: Berna Bue, MD;  Location: [facility] OR;  Service: General;  Laterality: N/A;   IR RADIOLOGIST EVAL & MGMT  11/19/2017   IR RADIOLOGIST EVAL & MGMT  12/03/2017   IR RADIOLOGIST EVAL & MGMT  12/18/2017   JOINT REPLACEMENT Right 2001   Total knee   LAMINECTOMY     X 3 LUMBAR AND X 2 CERVICAL SPINE OPERATIONS   LAPAROSCOPIC CHOLECYSTECTOMY W/ CHOLANGIOGRAPHY  11/09/04   SURGEON DR. Caleen Essex   LEFT HEART CATH AND CORONARY ANGIOGRAPHY N/A 09/16/2018   Procedure: LEFT HEART CATH AND CORONARY ANGIOGRAPHY;  Surgeon: Elder Negus, MD;  [facility];  Service: Cardiovascular;  Laterality: N/A;   LEFT HEART CATHETERIZATION WITH CORONARY ANGIOGRAM N/A 10/29/2014   Procedure: LEFT HEART CATHETERIZATION WITH CORONARY ANGIOGRAM;  Surgeon: Pamella Pert, MD;  Location: [facility] CATH LAB;  Service: Cardiovascular;  Laterality: N/A;   LIGATION OF COMPETING BRANCHES OF ARTERIOVENOUS FISTULA Left 07/21/2020   Procedure: LIGATION OF COMPETING BRANCHES OF LEFT UPPER ARM ARTERIOVENOUS FISTULA;  Surgeon: Maeola Harman, MD;  Location: [facility] OR;  Service: Vascular;  Laterality: Left;    LOWER EXTREMITY ANGIOGRAM N/A 03/19/2012   Procedure: LOWER EXTREMITY ANGIOGRAM;  Surgeon: Kathleene Hazel, MD;  Location: [facility] CATH LAB;  Service: Cardiovascular;  Laterality: N/A;   LOWER EXTREMITY ANGIOGRAPHY N/A 06/20/2021   Procedure: LOWER EXTREMITY ANGIOGRAPHY;  Surgeon: Elder Negus, MD;  [facility];  Service: Cardiovascular;  Laterality: N/A;   NECK SURGERY     PARTIAL COLECTOMY N/A 01/13/2018   Procedure: LAPAROSCOPIC ASSISTED   SIGMOID COLECTOMY ILEOSTOMY;  Surgeon: Berna Bue, MD;  Location: [facility];  Service: General;  Laterality: N/A;   PENILE PROSTHESIS IMPLANT  08/14/05   INFRAPUBIC INSERTION OF INFLATABLE PENILE PROSTHESIS; SURGEON DR. Logan Bores   PENILE PROSTHESIS IMPLANT     PERCUTANEOUS CORONARY STENT INTERVENTION (PCI-S) Right 10/29/2014   Procedure: PERCUTANEOUS CORONARY STENT INTERVENTION (PCI-S);  Surgeon: Pamella Pert, MD;  Location: [facility] CATH LAB;  Service: Cardiovascular;  Laterality: Right;   PERIPHERAL VASCULAR INTERVENTION Left 06/23/2021   Procedure: PERIPHERAL VASCULAR INTERVENTION;  Surgeon: Elder Negus, MD;  [facility];  Service: Cardiovascular;  Laterality: Left;   REMOVAL OF PENILE PROSTHESIS N/A 06/14/2021   Procedure: Removal of THREE piece inflatable penile prosthesis;  Surgeon: Crista Elliot, MD;  Location: [facility] OR;  Service: Urology;  Laterality: N/A;   SHOULDER ARTHROSCOPY     SPINE SURGERY     TOE AMPUTATION Left    TONSILLECTOMY     TOTAL KNEE ARTHROPLASTY  07/2002   RIGHT KNEE ; SURGEON  DR. [name] ALSO HAD ARTHROSCOPIC RIGHT KNEE IN  10/2001   TOTAL KNEE ARTHROPLASTY     ULNAR NERVE TRANSPOSITION Right 10/21/2013   Procedure: RIGHT ELBOW  ULNAR NERVE DECOMPRESSION;  Surgeon: Nicki Reaper, MD;  Location: [facility];  Service: Orthopedics;  Laterality: Right;     Home Medications:  Prior to Admission medications   Medication Sig Start Date End Date Taking? Authorizing  Provider  amiodarone (PACERONE) 200 MG tablet Take 1 tablet (200 mg total) by mouth daily. Patient taking differently: Take 200 mg by mouth in the morning. 10/29/22   Yates Decamp, MD  amLODipine (NORVASC) 10 MG tablet Take 10 mg by mouth in the morning.    [provider]  apixaban (ELIQUIS) 5 MG TABS tablet Take 1 tablet (5 mg total) by  mouth 2 (two) times daily. 01/14/22 03/09/24  Almon Hercules, MD  atorvastatin (LIPITOR) 40 MG tablet Take 40 mg by mouth at bedtime.     [provider]  B Complex-C-Zn-Folic Acid (DIALYVITE/ZINC) TABS Take 1 tablet by mouth Every Tuesday,Thursday,and Saturday with dialysis. 10/04/21   [provider]  famotidine (PEPCID) 20 MG tablet Take 20 mg by mouth every other day.    [provider]  fentaNYL (DURAGESIC) 50 MCG/HR 1 patch every 3 (three) days. 07/25/23   [provider]  folic acid (FOLVITE) 1 MG tablet Take 1 mg by mouth in the morning. 09/18/21   [provider]  gabapentin (NEURONTIN) 100 MG capsule Take 100 mg by mouth 3 (three) times daily.    [provider]  hydrALAZINE (APRESOLINE) 50 MG tablet Take 50 mg by mouth every 8 (eight) hours.    [provider]  Icosapent Ethyl 0.5 g CAPS Take 1 capsule by mouth in the morning.    [provider]  insulin aspart (NOVOLOG FLEXPEN) 100 UNIT/ML FlexPen Inject 0-12 Units into the skin See admin instructions. Inject 4 units into the skin with meals in addition to sliding scale as needed. Inject as per sliding scale: If 0 - 69 = 0 initiate hypoglycemia orders;  70 - 150 = 0;  151 - 200 = 2;  201 - 250 = 4;  251 - 300 = 6;  301 - 350 = 8;  351 - 400 = 10;  401 - 450 = 12.  If >400, give 12 units, recheck in 1 hour. If still >400, call MD/NP for further orders. Subcutaneously with meals in addition to scheduled 4 unites with meals.    [provider]  insulin aspart (NOVOLOG) 100 UNIT/ML injection Inject 4 Units into the skin 3  (three) times daily with meals.    [provider]  isosorbide mononitrate (IMDUR) 60 MG 24 hr tablet Take 1 tablet (60 mg total) by mouth daily. Patient taking differently: Take 60 mg by mouth in the morning. 01/21/23 01/08/24  Yates Decamp, MD  lidocaine-prilocaine (EMLA) cream Apply 1 application topically See admin instructions. Prior to Dialysis days Tuesday,Thursday and saturday 06/06/21   [provider]  liver oil-zinc oxide (DESITIN) 40 % ointment Apply 1 Application topically as needed for irritation.    [provider]  loratadine (CLARITIN) 10 MG tablet Take 10 mg by mouth in the morning.    [provider]  midodrine (PROAMATINE) 10 MG tablet Take 10 mg by mouth daily as needed (SBP less than 100.).    [provider]  mineral oil-hydrophilic petrolatum (AQUAPHOR) ointment Apply 1 Application topically as needed for dry skin or irritation (Upper thigh).    [provider]  nitroGLYCERIN (NITROSTAT) 0.4 MG SL tablet Place 0.4 mg under the tongue every 5 (five) minutes as needed for chest pain. 01/06/22   [provider]  oxyCODONE-acetaminophen (PERCOCET/ROXICET) 5-325 MG tablet Take 1 tablet by mouth every 8 (eight) hours as needed for severe pain. 05/31/23   Ghimire, Werner Lean, MD  promethazine (PHENERGAN) 12.5 MG suppository Place 12.5 mg rectally daily as needed for nausea or vomiting.    [provider]  rOPINIRole (REQUIP) 2 MG tablet Take 2 mg by mouth at bedtime.    [provider]  tamsulosin (FLOMAX) 0.4 MG CAPS capsule Take 0.4 mg by mouth in the morning.    [provider]    Inpatient Medications: Scheduled Meds:  Continuous  Infusions:  PRN Meds:   Allergies:    Allergies  Allergen Reactions   Contrast Media [Iodinated Contrast Media] Shortness Of Breath and Other (See Comments)    Difficulty breathing and altered mental status     Ivp Dye [Iodinated Contrast Media] Anaphylaxis,  Shortness Of Breath and Other (See Comments)    Breathing problems, altered mental state    Latex Rash and Other (See Comments)    A severe rash appears after the first 24 hours of being placed   Tape Other (See Comments) and Rash    Rash after 1 day of use  Do not leave on longer then 3 days with out be changed    Social History:   Social History   Socioeconomic History   Marital status: Married    Spouse name: Not on file   Number of children: 3   Years of education: Not on file   Highest education level: Not on file  Occupational History   Occupation: TRUCK DRIVER    Employer: UNEMPLOYED  Tobacco Use   Smoking status: Every Day    Current packs/day: 0.50    Average packs/day: 0.5 packs/day for 35.0 years (17.5 ttl pk-yrs)    Types: Cigarettes   Smokeless tobacco: Never  Vaping Use   Vaping status: Every Day   Substances: Nicotine  Substance and Sexual Activity   Alcohol use: Not Currently    Comment: "not in a long time"   Drug use: Never   Sexual activity: Yes    Birth control/protection: Implant    Comment: penile implant  Other Topics Concern   Not on file  Social History Narrative   ** Merged History Encounter **       HEAVY SMOKER AND CONTINUES TO SMOKE 1 PPD. DOES NOT EXERCISE REGULARLY.  Wife [phone] Otho Najjar). Has 2 sons and daughter. Still active       Social Drivers of Corporate investment banker Strain: Not on file  Food Insecurity: No Food Insecurity (11/19/2023)   Hunger Vital Sign    Worried About Running Out of Food in the Last Year: Never true    Ran Out of Food in the Last Year: Never true  Transportation Needs: No Transportation Needs (11/19/2023)   PRAPARE - Administrator, Civil Service (Medical): No    Lack of Transportation (Non-Medical): No  Physical Activity: Not on file  Stress: Not on file  Social Connections: Moderately Isolated (11/19/2023)   Social Connection and Isolation Panel [NHANES]    Frequency of  Communication with Friends and Family: More than three times a week    Frequency of Social Gatherings with Friends and Family: More than three times a week    Attends Religious Services: Never    Database administrator or Organizations: No    Attends Banker Meetings: Never    Marital Status: Married  Catering manager Violence: Not At Risk (11/19/2023)   Humiliation, Afraid, Rape, and Kick questionnaire    Fear of Current or Ex-Partner: No    Emotionally Abused: No    Physically Abused: No    Sexually Abused: No    Family History:    Family History  Problem Relation Age of Onset   Heart disease Father        Before age 37-  CAD, BPG   Diabetes Father        Amputation   Cancer Father        PROSTATE  Hyperlipidemia Father    Hypertension Father    Heart attack Father        Triple BPG   Varicose Veins Father    Cancer Sister        Breast   Hyperlipidemia Sister    Hypertension Sister    Heart attack Brother    Colon cancer Brother    Diabetes Brother    Heart disease Brother 68       A-Fib. Before age 38   Hyperlipidemia Brother    Hypertension Brother    Hypertension Son    Arthritis Other        GRANDMOTHER   Hypertension Other        OTHER FAMILY MEMBERS     ROS:  Please see the history of present illness.   All other ROS reviewed and negative.     Physical Exam/Data:   Vitals:   11/23/23 1136 11/23/23 1200 11/23/23 1230 11/23/23 1346  BP: (!) 112/49 (!) 110/39 (!) 109/41 (!) 121/45  Pulse: (!) 45 (!) 41 (!) 46 (!) 48  Resp: 15 13 18 17   Temp: 98.7 F (37.1 C)   98.8 F (37.1 C)  TempSrc: Oral     SpO2: 100% 100% 100% 100%  Weight:      Height:       No intake or output data in the 24 hours ending 11/23/23 1405    11/23/2023   11:33 AM 11/20/2023   12:54 PM 09/26/2023    1:41 PM  Last 3 Weights  Weight (lbs) 245 lb -- 227 lb 1.2 oz  Weight (kg) 111.131 kg -- 103 kg     Body mass index is 31.46 kg/m.  General:  Well nourished,  well developed, in no acute distress HEENT: normal Neck: no JVD Vascular: No carotid bruits; Distal pulses 2+ bilaterally Cardiac:  normal S1, S2; RRR; no murmur  Lungs:  clear to auscultation bilaterally, no wheezing, rhonchi or rales  Abd: soft, nontender, no hepatomegaly  Ext: no edema Musculoskeletal:  BL BKA Skin: warm and dry  Neuro:  CNs 2-12 intact, no focal abnormalities noted Psych:  Normal affect   EKG:  The EKG was personally reviewed and demonstrates:  sinus rhythm RBBB, LAFB  Telemetry:  Telemetry was personally reviewed and demonstrates:  NSR  Relevant CV Studies: TTE 03/31/2023 1. Left ventricular ejection fraction, by estimation, is 60 to 65%. The  left ventricle has normal function. The left ventricle has no regional  wall motion abnormalities. There is mild concentric left ventricular  hypertrophy. Left ventricular diastolic  parameters are consistent with Grade I diastolic dysfunction (impaired  relaxation). Elevated left atrial pressure.   2. Right ventricular systolic function is normal. The right ventricular  size is normal.   3. Left atrial size was moderately dilated.   4. The mitral valve is normal in structure. No evidence of mitral valve  regurgitation. No evidence of mitral stenosis. Moderate mitral annular  calcification.   5. The aortic valve is tricuspid. Aortic valve regurgitation is not  visualized. Aortic valve sclerosis/calcification is present, without any  evidence of aortic stenosis.   6. The inferior vena cava is dilated in size with >50% respiratory  variability, suggesting right atrial pressure of 8 mmHg.   LHC 09/16/2018 LM: Normal LCx: Normal LAD: High Diag 1 40-50% disease LCx: Normal RCA: Patent prox to distal RCA stents. Mild 20% prox late lumen loss LVEDP 32 mmHg   Conclusion: Nonobstructive CAD with patent RCA stents Elevated  LVEDP 32 mmHg Hypertensive emergency.  Laboratory Data:  High Sensitivity Troponin:   Recent  Labs  Lab 11/19/23 1359 11/19/23 1612 11/23/23 1138  TROPONINIHS 161* 169* 336*     Chemistry Recent Labs  Lab 11/19/23 1359 11/19/23 1436 11/20/23 0444 11/23/23 1138 11/23/23 1153  NA 132*   < > 134* 135 135  K 5.2*   < > 5.6* 4.1 4.0  CL 95*   < > 96* 98 102  CO2 25  --  26 22  --   GLUCOSE 129*   < > 120* 163* 160*  BUN 43*   < > 55* 28* 32*  CREATININE 6.39*   < > 7.13* 5.08* 5.50*  CALCIUM 7.8*  --  7.9* 7.4*  --   GFRNONAA 9*  --  8* 12*  --   ANIONGAP 12  --  12 15  --    < > = values in this interval not displayed.    Recent Labs  Lab 11/19/23 1359 11/23/23 1138  PROT 5.8* 5.2*  ALBUMIN 3.0* 2.9*  AST 15 20  ALT 14 11  ALKPHOS 75 74  BILITOT 0.6 0.6   Lipids No results for input(s): "CHOL", "TRIG", "HDL", "LABVLDL", "LDLCALC", "CHOLHDL" in the last 168 hours.  Hematology Recent Labs  Lab 11/19/23 1359 11/19/23 1436 11/19/23 2255 11/23/23 1138 11/23/23 1153  WBC 5.0  --  5.4 5.8  --   RBC 3.12*  --  3.23* 2.94*  --   HGB 9.9*   < > 10.2* 9.3* 9.5*  HCT 30.1*   < > 31.5* 29.6* 28.0*  MCV 96.5  --  97.5 100.7*  --   MCH 31.7  --  31.6 31.6  --   MCHC 32.9  --  32.4 31.4  --   RDW 14.3  --  14.3 14.0  --   PLT 146*  --  148* 118*  --    < > = values in this interval not displayed.   Thyroid No results for input(s): "TSH", "FREET4" in the last 168 hours.  BNPNo results for input(s): "BNP", "PROBNP" in the last 168 hours.  DDimer No results for input(s): "DDIMER" in the last 168 hours.   Radiology/Studies:  [facility] Chest Port 1 View Result Date: 11/23/2023 CLINICAL DATA:  Weakness and chest pain. EXAM: PORTABLE CHEST 1 VIEW COMPARISON:  11/19/2023 and 09/22/2023.  CT, 07/18/2023. FINDINGS: Cardiac silhouette borderline enlarged. No mediastinal or hilar masses. Hazy lung base opacities are noted accentuated by relatively low lung volumes. Remainder of the lungs is clear. Suspect small effusions. No pneumothorax. Skeletal structures are grossly intact.  IMPRESSION: Low lung volumes with hazy lung base opacities suspected to be small effusions with atelectasis. Cannot exclude pneumonia. No pulmonary edema. Electronically Signed   By: Amie Portland M.D.   On: 11/23/2023 11:57   DG Chest Port 1 View Result Date: 11/19/2023 CLINICAL DATA:  Shortness of breath and chest pain. EXAM: PORTABLE CHEST 1 VIEW COMPARISON:  09/22/2023 FINDINGS: Mild cardiomegaly. Aortic atherosclerosis. Stable mediastinal contours. Patchy airspace disease in the lung bases is increased from prior exam. No pulmonary edema, pneumothorax or large pleural effusion. Lower cervical spine hardware partially included. IMPRESSION: 1. Patchy airspace disease in the lung bases, increased from prior exam. This may represent atelectasis or pneumonia. 2. Mild cardiomegaly. Electronically Signed   By: Narda Rutherford M.D.   On: 11/19/2023 16:30     Assessment and Plan:  Possible NSTEMI Hx of RCA PCI 2016 He reports  persistent chest pressure at rest, he was recently admitted and now returns for this same issue.  He is certainly high risk for ACS.  Troponin level can be elevated in patients with dialysis.  However with risk we will plan to manage as NSTEMI.  Has possible in-stent stenosis with sinus bradycardia -Will plan for left heart cath on Monday -Start heparin gtt -Hold Eliquis -Continue Lipitor -No beta-blocker with sinus bradycardia  Informed Consent   Shared Decision Making/Informed Consent The risks [stroke (1 in 1000), death (1 in 1000), kidney failure [usually temporary] (1 in 500), bleeding (1 in 200), allergic reaction [possibly serious] (1 in 200)], benefits (diagnostic support and management of coronary artery disease) and alternatives of a cardiac catheterization were discussed in detail with [name] and he is willing to proceed.      PAF -Sinus bradycardia -Holding Eliquis   Sinus Bradycardia He is currently stable.  Labetalol was recently stopped.  Can stop  his amiodarone.  No indication for a pacemaker.   Other Macrocytic anemia Chronic Thrombocytopenia; Plt>50   Risk Assessment/Risk Scores:       For questions or updates, please contact [facility] Please consult [email or web address] for contact info under    Signed, Maisie Fus, MD  11/23/2023 2:05 PM

## 2023-11-23 NOTE — ED Provider Notes (Signed)
 [place] EMERGENCY DEPARTMENT AT [facility] Provider Note   CSN: [id number] Arrival date & time: 11/23/23  1126     History {Add pertinent medical, surgical, social history, OB history to HPI:1} Chief Complaint  Patient presents with   Bradycardia    [name] is a 68 y.o. male.  68 year old male with prior medical history as below presents for evaluation.  Patient arrives from [facility].  Patient reports onset of weakness, slow heart rate, fatigue this morning.  He reports that symptoms began approximately 30 to 45 minutes after medication administration this morning.  Of note, patient with recent admission earlier this week for similar complaints.  See brief discharge summary below.  Patient thinks that his labetalol was discontinued as of his last admission.  "68yo man with a complex medical history including ESRD, hypertension, and paroxysmal Afib who presented from his dialysis center with symptomatic (lightheadedness, dyspnea) bradycardia (to 40s) and hypotension (SBP in 70s at HD). This was in the setting of taking home labetalol, hydralazine, amlodipine, and imdur. We held all blood pressure medicines. His BP quickly improved without intervention. His HR improved over the course of 24 hours with beta blocker washout. He received dialysis here and tolerated it well.   He will discharge back to his long term care facility at [facility]. We have stopped his labetalol. We also held his amlodipine, hydralazine and imdur at discharge - Nephrology may opt to restart at their discretion."   The history is provided by the patient and medical records.       Home Medications Prior to Admission medications   Medication Sig Start Date End Date Taking? Authorizing Provider  amiodarone (PACERONE) 200 MG tablet Take 1 tablet (200 mg total) by mouth daily. Patient taking differently: Take 200 mg by mouth in the morning. 10/29/22   Yates Decamp, MD  amLODipine (NORVASC)  10 MG tablet Take 10 mg by mouth in the morning.    [provider]  apixaban (ELIQUIS) 5 MG TABS tablet Take 1 tablet (5 mg total) by mouth 2 (two) times daily. 01/14/22 03/09/24  Almon Hercules, MD  atorvastatin (LIPITOR) 40 MG tablet Take 40 mg by mouth at bedtime.     [provider]  B Complex-C-Zn-Folic Acid (DIALYVITE/ZINC) TABS Take 1 tablet by mouth Every Tuesday,Thursday,and Saturday with dialysis. 10/04/21   [provider]  famotidine (PEPCID) 20 MG tablet Take 20 mg by mouth every other day.    [provider]  fentaNYL (DURAGESIC) 50 MCG/HR 1 patch every 3 (three) days. 07/25/23   [provider]  folic acid (FOLVITE) 1 MG tablet Take 1 mg by mouth in the morning. 09/18/21   [provider]  gabapentin (NEURONTIN) 100 MG capsule Take 100 mg by mouth 3 (three) times daily.    [provider]  hydrALAZINE (APRESOLINE) 50 MG tablet Take 50 mg by mouth every 8 (eight) hours.    [provider]  Icosapent Ethyl 0.5 g CAPS Take 1 capsule by mouth in the morning.    [provider]  insulin aspart (NOVOLOG FLEXPEN) 100 UNIT/ML FlexPen Inject 0-12 Units into the skin See admin instructions. Inject 4 units into the skin with meals in addition to sliding scale as needed. Inject as per sliding scale: If 0 - 69 = 0 initiate hypoglycemia orders;  70 - 150 = 0;  151 - 200 = 2;  201 - 250 = 4;  251 - 300 = 6;  301 - 350 = 8;  351 - 400 = 10;  401 - 450 = 12.  If >400, give 12 units, recheck in 1 hour. If still >400, call MD/NP for further orders. Subcutaneously with meals in addition to scheduled 4 unites with meals.    [provider]  insulin aspart (NOVOLOG) 100 UNIT/ML injection Inject 4 Units into the skin 3 (three) times daily with meals.    [provider]  isosorbide mononitrate (IMDUR) 60 MG 24 hr tablet Take 1 tablet (60 mg total) by mouth daily. Patient taking differently: Take 60 mg by mouth  in the morning. 01/21/23 01/08/24  Yates Decamp, MD  lidocaine-prilocaine (EMLA) cream Apply 1 application topically See admin instructions. Prior to Dialysis days Tuesday,Thursday and saturday 06/06/21   [provider]  liver oil-zinc oxide (DESITIN) 40 % ointment Apply 1 Application topically as needed for irritation.    [provider]  loratadine (CLARITIN) 10 MG tablet Take 10 mg by mouth in the morning.    [provider]  midodrine (PROAMATINE) 10 MG tablet Take 10 mg by mouth daily as needed (SBP less than 100.).    [provider]  mineral oil-hydrophilic petrolatum (AQUAPHOR) ointment Apply 1 Application topically as needed for dry skin or irritation (Upper thigh).    [provider]  nitroGLYCERIN (NITROSTAT) 0.4 MG SL tablet Place 0.4 mg under the tongue every 5 (five) minutes as needed for chest pain. 01/06/22   [provider]  oxyCODONE-acetaminophen (PERCOCET/ROXICET) 5-325 MG tablet Take 1 tablet by mouth every 8 (eight) hours as needed for severe pain. 05/31/23   Ghimire, Werner Lean, MD  promethazine (PHENERGAN) 12.5 MG suppository Place 12.5 mg rectally daily as needed for nausea or vomiting.    [provider]  rOPINIRole (REQUIP) 2 MG tablet Take 2 mg by mouth at bedtime.    [provider]  tamsulosin (FLOMAX) 0.4 MG CAPS capsule Take 0.4 mg by mouth in the morning.    [provider]      Allergies    Contrast media [iodinated contrast media], Ivp dye [iodinated contrast media], Latex, and Tape    Review of Systems   Review of Systems  All other systems reviewed and are negative.   Physical Exam Updated Vital Signs BP (!) 112/49 (BP Location: Right Arm)   Pulse (!) 45   Temp 98.7 F (37.1 C) (Oral)   Resp 15   Ht 6\' 2"  (1.88 m)   Wt 111.1 kg   SpO2 100%   BMI 31.46 kg/m  Physical Exam Vitals and nursing note reviewed.  Constitutional:      General: He is not in acute distress.     Appearance: Normal appearance. He is well-developed.  HENT:     Head: Normocephalic and atraumatic.  Eyes:     Conjunctiva/sclera: Conjunctivae normal.     Pupils: Pupils are equal, round, and reactive to light.  Cardiovascular:     Rate and Rhythm: Regular rhythm. Bradycardia present.     Heart sounds: Normal heart sounds.  Pulmonary:     Effort: Pulmonary effort is normal. No respiratory distress.     Breath sounds: Normal breath sounds.  Abdominal:     General: There is no distension.     Palpations: Abdomen is soft.     Tenderness: There is no abdominal tenderness.  Musculoskeletal:        General: No deformity. Normal range of motion.     Cervical back: Normal  range of motion and neck supple.  Skin:    General: Skin is warm and dry.  Neurological:     General: No focal deficit present.     Mental Status: He is alert and oriented to person, place, and time.     ED Results / Procedures / Treatments   Labs (all labs ordered are listed, but only abnormal results are displayed) Labs Reviewed  CBC WITH DIFFERENTIAL/PLATELET  COMPREHENSIVE METABOLIC PANEL  I-STAT CHEM 8, ED  TROPONIN I (HIGH SENSITIVITY)    EKG None  Radiology No results found.  Procedures Procedures  {Document cardiac monitor, telemetry assessment procedure when appropriate:1}  Medications Ordered in ED Medications - No data to display  ED Course/ Medical Decision Making/ A&P   {   Click here for ABCD2, HEART and other calculatorsREFRESH Note before signing :1}                              Medical Decision Making Amount and/or Complexity of Data Reviewed Labs: ordered. Radiology: ordered.    Medical Screen Complete  This patient presented to the ED with complaint of ***.  This complaint involves an extensive number of treatment options. The initial differential diagnosis includes, but is not limited to, ***  This presentation is:  {IllnessRisk:19196::"***","Acute","Chronic","Self-Limited","Previously Undiagnosed","Uncertain Prognosis","Complicated","Systemic Symptoms","Threat to Life/Bodily Function"}    Co morbidities that complicated the patient's evaluation  ***   Additional history obtained:  Additional history obtained from {History source:19196::"EMS","Spouse","Family","Friend","Caregiver"} External records from outside sources obtained and reviewed including prior ED visits and prior Inpatient records.    Lab Tests:  I ordered and personally interpreted labs.  The pertinent results include:  ***   Imaging Studies ordered:  I ordered imaging studies including ***  I independently visualized and interpreted obtained imaging which showed *** I agree with the radiologist interpretation.   Cardiac Monitoring:  The patient was maintained on a cardiac monitor.  I personally viewed and interpreted the cardiac monitor which showed an underlying rhythm of: ***   Medicines ordered:  I ordered medication including ***  for ***  Reevaluation of the patient after these medicines showed that the patient: {resolved/improved/worsened:23923::"improved"}    Test Considered:  ***   Critical Interventions:  ***   Consultations Obtained:  I consulted ***,  and discussed lab and imaging findings as well as pertinent plan of care.    Problem List / ED Course:  ***   Reevaluation:  After the interventions noted above, I reevaluated the patient and found that they have: {resolved/improved/worsened:23923::"improved"}   Social Determinants of Health:  ***   Disposition:  After consideration of the diagnostic results and the patients response to treatment, I feel that the patent would benefit from ***.    {Document critical care time when appropriate:1} {Document review of labs and clinical decision tools ie heart score, Chads2Vasc2 etc:1}  {Document your independent review of radiology  images, and any outside records:1} {Document your discussion with family members, caretakers, and with consultants:1} {Document social determinants of health affecting pt's care:1} {Document your decision making why or why not admission, treatments were needed:1} Final Clinical Impression(s) / ED Diagnoses Final diagnoses:  None    Rx / DC Orders ED Discharge Orders     None

## 2023-11-23 NOTE — ED Notes (Signed)
 Pt states he is having a hard time breathing. Pt placed on nasal canula for comfort at 2L. Prior to placement pt was O2 100% RA. Pt says his chest is hurting as well. Pt heart rhythm 45 sinus bradycardia. RN notified Dr. Rodena Medin for interventions.

## 2023-11-23 NOTE — ED Triage Notes (Signed)
 PT BIB [facility] coming from [facility]. 945AM onset of dizziness, bradycardia, and SOB.  HR 40s SB 106/51 RA 89% 2L [place] - resolved SOB and at 99%  324 aspirin 40 mL NS bolus  HX HD T/Th/Sat (supposed to go at 1030 today)

## 2023-11-23 NOTE — ED Notes (Signed)
 Pt requested plastic wrap be removed from fistula site and wipe arm off.  Pt requesting abx ointment. RN notified EDP

## 2023-11-23 NOTE — Progress Notes (Addendum)
 Pt received from ED, patient is alert and oriented *4, vital signs obtained, CCMD notified, orientation given about the unit, all needs met.   Patient complaint of chest pain and pressure over his left chest 6/10, MD notified via secure chat about chest pain and HR, said they will evaluate him before turning over to night team.  Pain med was given in ED an hour ago.   Patient has moisture associated dermatitis on his sacrum and B/L posterior thighs, cleansed and barrier cream applied .   11/23/23 1636  Vitals  Temp 98.3 F (36.8 C)  Temp Source Oral  BP (!) 114/51  MAP (mmHg) 72  BP Location Right Arm  BP Method Automatic  Patient Position (if appropriate) Lying  Pulse Rate (!) 46  Pulse Rate Source Monitor  ECG Heart Rate (!) 42  Resp 15  Level of Consciousness  Level of Consciousness Alert  Oxygen Therapy  SpO2 100 %  O2 Device Room Air

## 2023-11-23 NOTE — Progress Notes (Signed)
 Reassessed prior to sign out. Still having some "tightness" across his chest, rates 6/10 relative to worst he felt today. No distress, skin is warm and dry, radial pulse is slow and regular. Troponin rise from 336 > 442. EKG stable from prior, sinus bradycardia without new ST-segment changes.  Chest discomfort - trend troponin to peak, heparin gtt to start tonight Bradycardia - BP safe, no atropine at present ESRD - no urgent HD indication  Marrianne Mood MD 11/23/2023, 6:42 PM

## 2023-11-23 NOTE — Hospital Course (Addendum)
  NSTEMI Bradycardia Hypotension This patient presented with hypotension and bradycardia in the 40s.  He also reported chest discomfort that he described as " heaviness".  His troponins continue to trend up and cardiology was consulted.  Throughout his hospitalization, his chest pain continued to get worse and refractory to nitroglycerin.  Patient was sent to the Cath Lab and found to have:  Severe 2 vessel obstructive CAD. The culprit lesion is a 99% thrombotic stenosis in the proximal LCx. There are focal lesion within stents in the ostial/proximal RCA and mid RCA Markedly elevated LVEDP 35 mm Hg Successful PCI with DES of the proximal LCX Partially successful PCI of the proximal RCA lesion with Shockwave lithotripsy and high pressure [place] balloon - reduced lesion to 50% but incomplete expansion. Unable to cross the lesion in the mid RCA with balloon. Postop, his hypotension and bradycardia had resolved as well as his chest pain.He converted to a-fib, asymptomatic- rates between 90-120 after his cath lab but reverted back to sinus rhythm short after. He has otherwise stable and agreed with plans to discharge back to his facility after HD   ESRD On HD Tuesday Thursday and Saturday end-stage renal disease.    Paroxysmal A-fib His Eliquis and amiodarone was continued  Type 2 diabetes with diabetic neuropathy Started on SSI and monitored blood glucose daily   Paroxysmal A-fib Sinus bradycardia now.  No beta-blockers.  Amiodarone discontinued today as well.  Stopping Eliquis at present in favor of heparin infusion for ACS.

## 2023-11-24 ENCOUNTER — Encounter (HOSPITAL_COMMUNITY)
Admission: EM | Disposition: A | Discharge status: Skilled Nursing Facility | Payer: Self-pay | Source: Skilled Nursing Facility | Attending: Internal Medicine

## 2023-11-24 ENCOUNTER — Inpatient Hospital Stay (HOSPITAL_COMMUNITY)

## 2023-11-24 DIAGNOSIS — Z743 Need for continuous supervision: Secondary | ICD-10-CM | POA: Diagnosis not present

## 2023-11-24 DIAGNOSIS — J9600 Acute respiratory failure, unspecified whether with hypoxia or hypercapnia: Secondary | ICD-10-CM | POA: Diagnosis not present

## 2023-11-24 DIAGNOSIS — R001 Bradycardia, unspecified: Secondary | ICD-10-CM | POA: Diagnosis present

## 2023-11-24 DIAGNOSIS — D539 Nutritional anemia, unspecified: Secondary | ICD-10-CM | POA: Diagnosis not present

## 2023-11-24 DIAGNOSIS — D631 Anemia in chronic kidney disease: Secondary | ICD-10-CM | POA: Diagnosis not present

## 2023-11-24 DIAGNOSIS — J449 Chronic obstructive pulmonary disease, unspecified: Secondary | ICD-10-CM | POA: Diagnosis not present

## 2023-11-24 DIAGNOSIS — Z992 Dependence on renal dialysis: Secondary | ICD-10-CM | POA: Diagnosis not present

## 2023-11-24 DIAGNOSIS — E1122 Type 2 diabetes mellitus with diabetic chronic kidney disease: Secondary | ICD-10-CM | POA: Diagnosis not present

## 2023-11-24 DIAGNOSIS — Z89512 Acquired absence of left leg below knee: Secondary | ICD-10-CM | POA: Diagnosis not present

## 2023-11-24 DIAGNOSIS — E1140 Type 2 diabetes mellitus with diabetic neuropathy, unspecified: Secondary | ICD-10-CM | POA: Diagnosis not present

## 2023-11-24 DIAGNOSIS — I25700 Atherosclerosis of coronary artery bypass graft(s), unspecified, with unstable angina pectoris: Secondary | ICD-10-CM | POA: Diagnosis not present

## 2023-11-24 DIAGNOSIS — I2510 Atherosclerotic heart disease of native coronary artery without angina pectoris: Secondary | ICD-10-CM

## 2023-11-24 DIAGNOSIS — R079 Chest pain, unspecified: Secondary | ICD-10-CM | POA: Diagnosis not present

## 2023-11-24 DIAGNOSIS — D696 Thrombocytopenia, unspecified: Secondary | ICD-10-CM | POA: Diagnosis not present

## 2023-11-24 DIAGNOSIS — I161 Hypertensive emergency: Secondary | ICD-10-CM | POA: Diagnosis not present

## 2023-11-24 DIAGNOSIS — I480 Paroxysmal atrial fibrillation: Secondary | ICD-10-CM | POA: Diagnosis not present

## 2023-11-24 DIAGNOSIS — E8779 Other fluid overload: Secondary | ICD-10-CM | POA: Diagnosis not present

## 2023-11-24 DIAGNOSIS — K760 Fatty (change of) liver, not elsewhere classified: Secondary | ICD-10-CM | POA: Diagnosis not present

## 2023-11-24 DIAGNOSIS — I120 Hypertensive chronic kidney disease with stage 5 chronic kidney disease or end stage renal disease: Secondary | ICD-10-CM | POA: Diagnosis not present

## 2023-11-24 DIAGNOSIS — I132 Hypertensive heart and chronic kidney disease with heart failure and with stage 5 chronic kidney disease, or end stage renal disease: Secondary | ICD-10-CM | POA: Diagnosis not present

## 2023-11-24 DIAGNOSIS — E1151 Type 2 diabetes mellitus with diabetic peripheral angiopathy without gangrene: Secondary | ICD-10-CM | POA: Diagnosis not present

## 2023-11-24 DIAGNOSIS — I214 Non-ST elevation (NSTEMI) myocardial infarction: Secondary | ICD-10-CM | POA: Diagnosis present

## 2023-11-24 DIAGNOSIS — I5032 Chronic diastolic (congestive) heart failure: Secondary | ICD-10-CM | POA: Diagnosis not present

## 2023-11-24 DIAGNOSIS — R0602 Shortness of breath: Secondary | ICD-10-CM | POA: Diagnosis not present

## 2023-11-24 DIAGNOSIS — E113512 Type 2 diabetes mellitus with proliferative diabetic retinopathy with macular edema, left eye: Secondary | ICD-10-CM | POA: Diagnosis not present

## 2023-11-24 DIAGNOSIS — N250 Renal osteodystrophy: Secondary | ICD-10-CM | POA: Diagnosis not present

## 2023-11-24 DIAGNOSIS — F17210 Nicotine dependence, cigarettes, uncomplicated: Secondary | ICD-10-CM | POA: Diagnosis not present

## 2023-11-24 DIAGNOSIS — G2581 Restless legs syndrome: Secondary | ICD-10-CM | POA: Diagnosis not present

## 2023-11-24 DIAGNOSIS — N2581 Secondary hyperparathyroidism of renal origin: Secondary | ICD-10-CM | POA: Diagnosis not present

## 2023-11-24 DIAGNOSIS — N186 End stage renal disease: Secondary | ICD-10-CM | POA: Diagnosis not present

## 2023-11-24 DIAGNOSIS — Z95828 Presence of other vascular implants and grafts: Secondary | ICD-10-CM | POA: Diagnosis not present

## 2023-11-24 DIAGNOSIS — Z7401 Bed confinement status: Secondary | ICD-10-CM | POA: Diagnosis not present

## 2023-11-24 DIAGNOSIS — E785 Hyperlipidemia, unspecified: Secondary | ICD-10-CM | POA: Diagnosis not present

## 2023-11-24 DIAGNOSIS — Z89511 Acquired absence of right leg below knee: Secondary | ICD-10-CM | POA: Diagnosis not present

## 2023-11-24 DIAGNOSIS — I452 Bifascicular block: Secondary | ICD-10-CM | POA: Diagnosis not present

## 2023-11-24 HISTORY — PX: CORONARY STENT INTERVENTION: CATH118234

## 2023-11-24 HISTORY — PX: CORONARY LITHOTRIPSY: CATH118330

## 2023-11-24 HISTORY — PX: LEFT HEART CATH AND CORONARY ANGIOGRAPHY: CATH118249

## 2023-11-24 LAB — CBC
HCT: 29.6 % — ABNORMAL LOW (ref 39.0–52.0)
HCT: 33.3 % — ABNORMAL LOW (ref 39.0–52.0)
Hemoglobin: 9.8 g/dL — ABNORMAL LOW (ref 13.0–17.0)
Hemoglobin: 10.8 g/dL — ABNORMAL LOW (ref 13.0–17.0)
MCH: 31.7 pg (ref 26.0–34.0)
MCH: 31 pg (ref 26.0–34.0)
MCHC: 33.1 g/dL (ref 30.0–36.0)
MCHC: 32.4 g/dL (ref 30.0–36.0)
MCV: 95.8 fL (ref 80.0–100.0)
MCV: 95.7 fL (ref 80.0–100.0)
Platelets: 128 10*3/uL — ABNORMAL LOW (ref 150–400)
Platelets: 146 10*3/uL — ABNORMAL LOW (ref 150–400)
RBC: 3.09 MIL/uL — ABNORMAL LOW (ref 4.22–5.81)
RBC: 3.48 MIL/uL — ABNORMAL LOW (ref 4.22–5.81)
RDW: 14.1 % (ref 11.5–15.5)
RDW: 14 % (ref 11.5–15.5)
WBC: 6 10*3/uL (ref 4.0–10.5)
WBC: 8.5 10*3/uL (ref 4.0–10.5)
nRBC: 0 % (ref 0.0–0.2)
nRBC: 0 % (ref 0.0–0.2)

## 2023-11-24 LAB — POCT ACTIVATED CLOTTING TIME
Activated Clotting Time: 256 s
Activated Clotting Time: 279 s
Activated Clotting Time: 285 s
Activated Clotting Time: 389 s
Activated Clotting Time: 118 s

## 2023-11-24 LAB — CULTURE, BLOOD (ROUTINE X 2)
Culture: NO GROWTH
Culture: NO GROWTH
Special Requests: ADEQUATE

## 2023-11-24 LAB — ECHOCARDIOGRAM COMPLETE
AR max vel: 1.48 cm2
AV Area VTI: 1.4 cm2
AV Area mean vel: 1.4 cm2
AV Mean grad: 12 mmHg
AV Peak grad: 22.4 mmHg
Ao pk vel: 2.37 m/s
Area-P 1/2: 2.84 cm2
Calc EF: 65.5 %
Height: 74 in
MV VTI: 1.74 cm2
S' Lateral: 3.3 cm
Single Plane A2C EF: 62.5 %
Single Plane A4C EF: 68.1 %
Weight: 4137.59 [oz_av]

## 2023-11-24 LAB — GLUCOSE, CAPILLARY
Glucose-Capillary: 155 mg/dL — ABNORMAL HIGH (ref 70–99)
Glucose-Capillary: 185 mg/dL — ABNORMAL HIGH (ref 70–99)

## 2023-11-24 LAB — RENAL FUNCTION PANEL
Albumin: 2.9 g/dL — ABNORMAL LOW (ref 3.5–5.0)
Anion gap: 16 — ABNORMAL HIGH (ref 5–15)
BUN: 38 mg/dL — ABNORMAL HIGH (ref 8–23)
CO2: 24 mmol/L (ref 22–32)
Calcium: 7.9 mg/dL — ABNORMAL LOW (ref 8.9–10.3)
Chloride: 94 mmol/L — ABNORMAL LOW (ref 98–111)
Creatinine, Ser: 6.49 mg/dL — ABNORMAL HIGH (ref 0.61–1.24)
GFR, Estimated: 9 mL/min — ABNORMAL LOW (ref >60)
Glucose, Bld: 225 mg/dL — ABNORMAL HIGH (ref 70–99)
Phosphorus: 6.5 mg/dL — ABNORMAL HIGH (ref 2.5–4.6)
Potassium: 4.2 mmol/L (ref 3.5–5.1)
Sodium: 134 mmol/L — ABNORMAL LOW (ref 135–145)

## 2023-11-24 LAB — CREATININE, SERUM
Creatinine, Ser: 7.05 mg/dL — ABNORMAL HIGH (ref 0.61–1.24)
GFR, Estimated: 8 mL/min — ABNORMAL LOW (ref >60)

## 2023-11-24 LAB — TROPONIN I (HIGH SENSITIVITY)
Troponin I (High Sensitivity): 323 ng/L (ref <18)
Troponin I (High Sensitivity): 319 ng/L (ref <18)

## 2023-11-24 LAB — APTT
aPTT: 38 s — ABNORMAL HIGH (ref 24–36)
aPTT: 91 s — ABNORMAL HIGH (ref 24–36)

## 2023-11-24 LAB — HEPARIN LEVEL (UNFRACTIONATED): Heparin Unfractionated: >1.1 [IU]/mL — ABNORMAL HIGH (ref 0.30–0.70)

## 2023-11-24 LAB — MRSA NEXT GEN BY PCR, NASAL: MRSA by PCR Next Gen: DETECTED — AB

## 2023-11-24 SURGERY — LEFT HEART CATH AND CORONARY ANGIOGRAPHY
Anesthesia: LOCAL

## 2023-11-24 MED ORDER — METHYLPREDNISOLONE SODIUM SUCC 125 MG IJ SOLR
INTRAMUSCULAR | Status: AC
Start: 2023-11-24 — End: ?
  Filled 2023-11-24: qty 2

## 2023-11-24 MED ORDER — FENTANYL CITRATE (PF) 100 MCG/2ML IJ SOLN
INTRAMUSCULAR | Status: AC
  Filled 2023-11-24: qty 2

## 2023-11-24 MED ORDER — ATROPINE SULFATE 1 MG/10ML IJ SOSY
PREFILLED_SYRINGE | INTRAMUSCULAR | Status: AC
  Filled 2023-11-24: qty 10

## 2023-11-24 MED ORDER — HYDROMORPHONE HCL 1 MG/ML IJ SOLN
INTRAMUSCULAR | Status: DC | PRN
  Administered 2023-11-24: 1 mg via INTRAVENOUS
  Administered 2023-11-24: .5 mg via INTRAVENOUS

## 2023-11-24 MED ORDER — SODIUM CHLORIDE 0.9% FLUSH
3.0000 mL | Freq: Two times a day (BID) | INTRAVENOUS | Status: DC
  Administered 2023-11-24 – 2023-11-25 (×2): 3 mL via INTRAVENOUS

## 2023-11-24 MED ORDER — CLOPIDOGREL BISULFATE 300 MG PO TABS
ORAL_TABLET | ORAL | Status: DC | PRN
  Administered 2023-11-24: 600 mg via ORAL

## 2023-11-24 MED ORDER — HYDRALAZINE HCL 20 MG/ML IJ SOLN
10.0000 mg | INTRAMUSCULAR | Status: AC | PRN
  Administered 2023-11-24: 10 mg via INTRAVENOUS
  Filled 2023-11-24: qty 1

## 2023-11-24 MED ORDER — SODIUM CHLORIDE 0.9 % IV SOLN: 250.0000 mL | INTRAVENOUS | Status: AC | PRN

## 2023-11-24 MED ORDER — IOHEXOL 350 MG/ML SOLN
INTRAVENOUS | Status: DC | PRN
  Administered 2023-11-24: 160 mL

## 2023-11-24 MED ORDER — CHLORHEXIDINE GLUCONATE CLOTH 2 % EX PADS
6.0000 | MEDICATED_PAD | Freq: Every day | CUTANEOUS | Status: DC
Start: 2023-11-25 — End: 2023-11-30
  Administered 2023-11-25: 6 via TOPICAL

## 2023-11-24 MED ORDER — ATROPINE SULFATE 1 MG/10ML IJ SOSY
PREFILLED_SYRINGE | INTRAMUSCULAR | Status: DC | PRN
  Administered 2023-11-24: 1 mg via INTRAVENOUS

## 2023-11-24 MED ORDER — NITROGLYCERIN IN D5W 200-5 MCG/ML-% IV SOLN
0.0000 ug/min | INTRAVENOUS | Status: DC
  Administered 2023-11-24: 5 ug/min via INTRAVENOUS
  Administered 2023-11-24: 10 ug/min via INTRAVENOUS
  Filled 2023-11-24: qty 250

## 2023-11-24 MED ORDER — LABETALOL HCL 5 MG/ML IV SOLN
10.0000 mg | Freq: Once | INTRAVENOUS | Status: AC
  Administered 2023-11-24: 10 mg via INTRAVENOUS
  Filled 2023-11-24: qty 4

## 2023-11-24 MED ORDER — HYDROMORPHONE HCL 1 MG/ML IJ SOLN
INTRAMUSCULAR | Status: AC
  Filled 2023-11-24: qty 1

## 2023-11-24 MED ORDER — SODIUM CHLORIDE 0.9 % IV SOLN: INTRAVENOUS | Status: DC

## 2023-11-24 MED ORDER — HYDROMORPHONE HCL 1 MG/ML IJ SOLN
INTRAMUSCULAR | Status: AC
  Filled 2023-11-24: qty 0.5

## 2023-11-24 MED ORDER — MIDAZOLAM HCL 2 MG/2ML IJ SOLN
INTRAMUSCULAR | Status: AC
  Filled 2023-11-24: qty 2

## 2023-11-24 MED ORDER — FENTANYL CITRATE (PF) 100 MCG/2ML IJ SOLN
INTRAMUSCULAR | Status: DC | PRN
  Administered 2023-11-24 (×2): 50 ug via INTRAVENOUS

## 2023-11-24 MED ORDER — HEPARIN (PORCINE) IN NACL 1000-0.9 UT/500ML-% IV SOLN
INTRAVENOUS | Status: DC | PRN
  Administered 2023-11-24: 500 mL

## 2023-11-24 MED ORDER — HEPARIN SODIUM (PORCINE) 1000 UNIT/ML IJ SOLN
INTRAMUSCULAR | Status: AC
  Filled 2023-11-24: qty 10

## 2023-11-24 MED ORDER — MORPHINE SULFATE (PF) 2 MG/ML IV SOLN
2.0000 mg | Freq: Once | INTRAVENOUS | Status: AC
  Administered 2023-11-24: 2 mg via INTRAVENOUS
  Filled 2023-11-24: qty 1

## 2023-11-24 MED ORDER — MIDAZOLAM HCL 2 MG/2ML IJ SOLN
INTRAMUSCULAR | Status: DC | PRN
  Administered 2023-11-24: 2 mg via INTRAVENOUS

## 2023-11-24 MED ORDER — HEPARIN SODIUM (PORCINE) 5000 UNIT/ML IJ SOLN
5000.0000 [IU] | Freq: Three times a day (TID) | INTRAMUSCULAR | Status: DC
  Administered 2023-11-25: 5000 [IU] via SUBCUTANEOUS
  Filled 2023-11-24: qty 1

## 2023-11-24 MED ORDER — FAMOTIDINE IN NACL 20-0.9 MG/50ML-% IV SOLN
INTRAVENOUS | Status: AC | PRN
  Administered 2023-11-24: 20 mg via INTRAVENOUS

## 2023-11-24 MED ORDER — SODIUM CHLORIDE 0.9% FLUSH: 3.0000 mL | INTRAVENOUS | Status: DC | PRN

## 2023-11-24 MED ORDER — LIDOCAINE HCL (PF) 1 % IJ SOLN
INTRAMUSCULAR | Status: DC | PRN
  Administered 2023-11-24: 10 mL via INTRADERMAL

## 2023-11-24 MED ORDER — HEPARIN SODIUM (PORCINE) 1000 UNIT/ML IJ SOLN
INTRAMUSCULAR | Status: DC | PRN
  Administered 2023-11-24: 4000 [IU] via INTRAVENOUS
  Administered 2023-11-24: 8000 [IU] via INTRAVENOUS
  Administered 2023-11-24: 2000 [IU] via INTRAVENOUS

## 2023-11-24 MED ORDER — LIDOCAINE HCL (PF) 1 % IJ SOLN
INTRAMUSCULAR | Status: AC
  Filled 2023-11-24: qty 30

## 2023-11-24 MED ORDER — ASPIRIN 81 MG PO CHEW: 81.0000 mg | CHEWABLE_TABLET | ORAL | Status: DC

## 2023-11-24 MED ORDER — CLOPIDOGREL BISULFATE 300 MG PO TABS
ORAL_TABLET | ORAL | Status: AC
  Filled 2023-11-24: qty 2

## 2023-11-24 MED ORDER — MUPIROCIN 2 % EX OINT
1.0000 | TOPICAL_OINTMENT | Freq: Two times a day (BID) | CUTANEOUS | Status: DC
  Administered 2023-11-25 (×2): 1 via NASAL
  Filled 2023-11-24: qty 22

## 2023-11-24 MED ORDER — FAMOTIDINE IN NACL 20-0.9 MG/50ML-% IV SOLN
INTRAVENOUS | Status: AC
  Filled 2023-11-24: qty 50

## 2023-11-24 MED ORDER — ACETAMINOPHEN 325 MG PO TABS: 650.0000 mg | ORAL_TABLET | ORAL | Status: DC | PRN

## 2023-11-24 MED ORDER — CLOPIDOGREL BISULFATE 75 MG PO TABS
75.0000 mg | ORAL_TABLET | Freq: Every day | ORAL | Status: DC
  Administered 2023-11-25: 75 mg via ORAL
  Filled 2023-11-24: qty 1

## 2023-11-24 MED ORDER — METHYLPREDNISOLONE SODIUM SUCC 125 MG IJ SOLR
INTRAMUSCULAR | Status: DC | PRN
  Administered 2023-11-24: 125 mg via INTRAVENOUS

## 2023-11-24 MED ORDER — ORAL CARE MOUTH RINSE: 15.0000 mL | OROMUCOSAL | Status: DC | PRN

## 2023-11-24 MED ORDER — HYDROMORPHONE HCL 1 MG/ML IJ SOLN
1.0000 mg | Freq: Once | INTRAMUSCULAR | Status: AC
  Administered 2023-11-25: 1 mg via INTRAVENOUS
  Filled 2023-11-24: qty 1

## 2023-11-24 MED ORDER — ALBUTEROL SULFATE (2.5 MG/3ML) 0.083% IN NEBU: 2.5000 mg | INHALATION_SOLUTION | RESPIRATORY_TRACT | Status: DC | PRN

## 2023-11-24 MED ORDER — ASPIRIN 81 MG PO TBEC
81.0000 mg | DELAYED_RELEASE_TABLET | Freq: Every day | ORAL | Status: DC
  Administered 2023-11-24 – 2023-11-25 (×2): 81 mg via ORAL
  Filled 2023-11-24 (×2): qty 1

## 2023-11-24 SURGICAL SUPPLY — 30 items
BALL SAPPHIRE NC24 3.0X8 (BALLOONS) ×1 IMPLANT
BALL SAPPHIRE NC24 3.25X12 (BALLOONS) ×1 IMPLANT
BALLN EMERGE MR 2.5X12 (BALLOONS) ×1 IMPLANT
BALLN ~~LOC~~ EMERGE MR 2.5X12 (BALLOONS) ×1 IMPLANT
BALLN ~~LOC~~ EMERGE MR 2.75X8 (BALLOONS) ×1 IMPLANT
BALLOON EMERGE MR 2.5X12 (BALLOONS) IMPLANT
BALLOON SAPPHIRE NC24 3.0X8 (BALLOONS) IMPLANT
BALLOON SAPPHIRE NC24 3.25X12 (BALLOONS) IMPLANT
BALLOON ~~LOC~~ EMERGE MR 2.5X12 (BALLOONS) IMPLANT
BALLOON ~~LOC~~ EMERGE MR 2.75X8 (BALLOONS) IMPLANT
CATH INFINITI 5FR MULTPACK ANG (CATHETERS) IMPLANT
CATH LAUNCHER 6FR AL.75 (CATHETERS) IMPLANT
CATH LAUNCHER 6FR EBU 4 (CATHETERS) IMPLANT
CATH LAUNCHER 6FR EBU3.5 (CATHETERS) IMPLANT
CATH LAUNCHER 6FR JR4 (CATHETERS) IMPLANT
CATH SHOCKWAVE C2 2.5X12 (CATHETERS) IMPLANT
CATH VISTA GUIDE 6FR XBRCA MPK (CATHETERS) IMPLANT
ELECT DEFIB PAD ADLT CADENCE (PAD) IMPLANT
GLIDESHEATH SLEND SS 6F .021 (SHEATH) IMPLANT
KIT ENCORE 26 ADVANTAGE (KITS) IMPLANT
KIT MICROPUNCTURE NIT STIFF (SHEATH) IMPLANT
PACK CARDIAC CATHETERIZATION (CUSTOM PROCEDURE TRAY) ×2 IMPLANT
SET ATX-X65L (MISCELLANEOUS) IMPLANT
SHEATH PINNACLE 6F 10CM (SHEATH) IMPLANT
SHEATH PINNACLE 7F 10CM (SHEATH) IMPLANT
SHEATH PROBE COVER 6X72 (BAG) IMPLANT
STENT SYNERGY XD 3.0X16 (Permanent Stent) IMPLANT
STENT SYS SYNERGY XD 3.0X16 (Permanent Stent) ×1 IMPLANT
WIRE ASAHI PROWATER 180CM (WIRE) IMPLANT
WIRE EMERALD 3MM-J .035X260CM (WIRE) IMPLANT

## 2023-11-24 NOTE — Plan of Care (Signed)
  Problem: Clinical Measurements: Goal: Will remain free from infection Outcome: Progressing   Problem: Clinical Measurements: Goal: Diagnostic test results will improve Outcome: Progressing   Problem: Clinical Measurements: Goal: Respiratory complications will improve Outcome: Progressing   Problem: Clinical Measurements: Goal: Cardiovascular complication will be avoided Outcome: Progressing   Problem: Activity: Goal: Risk for activity intolerance will decrease Outcome: Progressing   Problem: Nutrition: Goal: Adequate nutrition will be maintained Outcome: Progressing

## 2023-11-24 NOTE — Progress Notes (Signed)
 Right femoral arterial sheath removed at 2305 pressure held for 25 minutes. No complications, site level 0 no hematoma no bleeding, gauze Tegaderm dressing placed on site.

## 2023-11-24 NOTE — Progress Notes (Signed)
 Patient's chest pain was not relieved with IV analgesics and Nitroglycerine drip, Nitroglycerine drip was titrated as per the order, informed to the cardiology and his attending MD about the patient's consistent chest pain, decided to do heart cath today, Pre-op checklist completed.   Patient transferred to the Cath lab, Report given to the RN, family was at bedside,.

## 2023-11-24 NOTE — Progress Notes (Signed)
 Heparin paused for 10 mins from 1155 as per the request of lab to draw heparin level from same extremity.

## 2023-11-24 NOTE — Care Management Obs Status (Signed)
 MEDICARE OBSERVATION STATUS NOTIFICATION   Patient Details  Name: [name] MRN: [id number] Date of Birth: 1956/01/20   Medicare Observation Status Notification Given:  Yes    Mikiala Fugett G., RN 11/24/2023, 9:32 AM

## 2023-11-24 NOTE — Progress Notes (Signed)
 ANTICOAGULATION CONSULT NOTE  Pharmacy Consult for Heparin Indication: chest pain/ACS  Allergies  Allergen Reactions   Contrast Media [Iodinated Contrast Media] Shortness Of Breath and Other (See Comments)    Difficulty breathing and altered mental status     Ivp Dye [Iodinated Contrast Media] Anaphylaxis, Shortness Of Breath and Other (See Comments)    Breathing problems, altered mental state    Latex Rash and Other (See Comments)    A severe rash appears after the first 24 hours of being placed   Tape Other (See Comments) and Rash    Rash after 1 day of use  Do not leave on longer then 3 days with out be changed    Patient Measurements: Height: 6\' 2"  (188 cm) Weight: 117.3 kg (258 lb 9.6 oz) IBW/kg (Calculated) : 82.2 Heparin Dosing Weight: 105.3 kg  Vital Signs: Temp: 98.5 F (36.9 C) (03/16 1139) Temp Source: Oral (03/16 1139) BP: 133/59 (03/16 1228) Pulse Rate: 58 (03/16 1228)  Labs: Recent Labs    11/23/23 1138 11/23/23 1153 11/23/23 1326 11/23/23 1646 11/23/23 1839 11/24/23 0836 11/24/23 1213  HGB 9.3* 9.5*  --   --   --  9.8*  --   HCT 29.6* 28.0*  --   --   --  29.6*  --   PLT 118*  --   --   --   --  128*  --   APTT  --   --   --   --   --   --  38*  HEPARINUNFRC  --   --   --   --   --   --  >1.10*  CREATININE 5.08* 5.50*  --   --   --  6.49*  --   TROPONINIHS 336*  --  381* 442* 418*  --   --     Estimated Creatinine Clearance: 14.6 mL/min (A) (by C-G formula based on SCr of 6.49 mg/dL (H)).   Medical History: Past Medical History:  Diagnosis Date   Carotid artery occlusion 11/10/2010   LEFT CAROTID ENDARTERECTOMY   Complication of anesthesia    BP WENT UP AT [facility] "   COPD (chronic obstructive pulmonary disease) (HCC)    pt denies this dx as of 06/01/20 - no inhaler    Diabetes mellitus without complication (HCC)    Diverticulitis    Diverticulosis of colon (without mention of hemorrhage)    DJD (degenerative joint disease)     knees/hands/feet/back/neck   ESRD (end stage renal disease) on dialysis (HCC)    Fatty liver    Full dentures    GERD (gastroesophageal reflux disease)    H/O hiatal hernia    History of blood transfusion    with a past surical procedure per patient 06/01/20   Hyperlipidemia    Hypertension    Neuromuscular disorder (HCC)    peripheral neuropathy   Non-pressure chronic ulcer of other part of left foot limited to breakdown of skin (HCC) 11/12/2016   Osteomyelitis (HCC)    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 Atherectomy left popliteal artery July 2012.    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 (Patient taking differently: Take 200 mg by mouth in the morning.) 100 tablet 1 11/23/2023   apixaban (ELIQUIS) 5 MG TABS tablet Take 1 tablet (5 mg total) by mouth 2 (two) times daily. 60 tablet 1 11/23/2023 Morning   atorvastatin (LIPITOR) 40 MG tablet Take 40 mg by mouth at bedtime.    Past Week   B Complex-C-Zn-Folic Acid (DIALYVITE/ZINC) TABS Take 1 tablet by mouth Every Tuesday,Thursday,and Saturday with dialysis.   11/23/2023   famotidine (PEPCID) 20 MG tablet Take 20 mg by mouth every other day.   11/23/2023   folic acid (FOLVITE) 1 MG tablet Take 1 mg by mouth in the morning.   11/23/2023   gabapentin (NEURONTIN) 100 MG capsule Take 100 mg by mouth 3 (three) times daily.   11/23/2023   Icosapent Ethyl 0.5 g CAPS Take 1 capsule by mouth in the morning.   11/23/2023   insulin aspart (NOVOLOG FLEXPEN) 100 UNIT/ML FlexPen Inject 0-12 Units into the skin See admin instructions. Inject 4 units  into the skin with meals in addition to sliding scale as needed. Inject as per sliding scale: If 0 - 69 = 0 initiate hypoglycemia orders;  70 - 150 = 0;  151 - 200 = 2;  201 - 250 = 4;  251 - 300 = 6;  301 - 350 = 8;  351 - 400 = 10;  401 - 450 = 12.  If >400, give 12 units, recheck in 1 hour. If still >400, call MD/NP for further orders. Subcutaneously with meals in addition to scheduled 4 unites with meals.   11/23/2023   insulin aspart (NOVOLOG) 100 UNIT/ML injection Inject 4 Units into the skin 3 (three) times daily with meals.   11/23/2023   lidocaine-prilocaine (EMLA) cream Apply 1 application topically See admin instructions. Prior to Dialysis days Tuesday,Thursday and saturday   11/23/2023   liver oil-zinc oxide (DESITIN) 40 % ointment Apply 1 Application topically as needed for irritation.   Taking As Needed   loratadine (CLARITIN) 10 MG tablet Take 10 mg by mouth in the morning.   11/23/2023   midodrine (PROAMATINE) 10 MG tablet Take 10 mg by mouth daily as needed (SBP less than 100.).   Taking As Needed   mineral oil-hydrophilic petrolatum (AQUAPHOR) ointment Apply 1 Application topically as needed for dry skin or irritation (Upper thigh).   Taking As Needed   nitroGLYCERIN (NITROSTAT) 0.4 MG SL tablet Place 0.4 mg under the tongue every 5 (five) minutes as needed for chest pain.   Taking As Needed   oxyCODONE-acetaminophen (PERCOCET/ROXICET) 5-325 MG tablet Take 1 tablet by mouth every 8 (eight) hours as needed for severe pain. 15 tablet 0 Past Week   promethazine (PHENERGAN) 12.5 MG suppository Place 12.5 mg rectally daily as needed for nausea or vomiting.   Taking As Needed   rOPINIRole (REQUIP) 2 MG tablet Take 2 mg by mouth at bedtime.   Past Week   tamsulosin (FLOMAX) 0.4 MG CAPS capsule Take 0.4 mg by mouth in the morning.   11/23/2023   [Paused] amLODipine (NORVASC) 10 MG tablet Take 10 mg by mouth in the morning.   11/21/2023   fentaNYL (DURAGESIC) 50 MCG/HR 1 patch every 3  (three) days.   11/21/2023   [Paused] hydrALAZINE (APRESOLINE) 50 MG tablet Take 50 mg by mouth every 8 (eight) hours. (Patient not taking:  Reported on 11/24/2023)   Not Taking   [Paused] isosorbide mononitrate (IMDUR) 60 MG 24 hr tablet Take 1 tablet (60 mg total) by mouth daily. (Patient taking differently: Take 60 mg by mouth in the morning.) 90 tablet 1 11/21/2023   labetalol (NORMODYNE) 200 MG tablet Take 200 mg by mouth 2 (two) times daily. (Patient not taking: Reported on 11/24/2023)   Not Taking   Scheduled:   aspirin EC  81 mg Oral Daily   atorvastatin  40 mg Oral QHS   Chlorhexidine Gluconate Cloth  6 each Topical Q0600   famotidine  20 mg Oral QODAY   gabapentin  100 mg Oral TID   insulin aspart  0-20 Units Subcutaneous TID WC   Infusions:   heparin 1,400 Units/hr (11/23/23 2211)   nitroGLYCERIN 15 mcg/min (11/24/23 1232)   PRN: albuterol, oxyCODONE-acetaminophen  Assessment: 67 yom with a history of atrial fibrillation on Eliquis (last dose 3/15 AM) who presented with bradycardia, possibly NSTEMI. Heparin per pharmacy consult placed for chest pain/ACS.  aPTT is subtherapeutic (38) and heparin level is supratherapeutic (> 1.10) due to recent DOAC use. Infusion was paused for 10 minutes per lab request to draw heparin level from same extremity as heparin infusion. No issues with infusion or s/sx of bleeding per RN. CBC stable.  Planning for [facility] [place] tomorrow.  Goal of Therapy:  Heparin level 0.3-0.7 units/ml aPTT 66-102 seconds Monitor platelets by anticoagulation protocol: Yes   Plan:  Increase heparin infusion to 1800 units/hr F/u 8hr aPTT Daily aPTT and HL Continue to monitor via aPTT until levels are correlated Continue to monitor H&H and platelets F/u ability to transition back to PTA Eliquis  Nicole Kindred, PharmD PGY1 Pharmacy Resident 11/24/2023 12:49 PM

## 2023-11-24 NOTE — H&P (View-Only) (Signed)
 Called to bedside for ongoing 8/10 chest pain. NTG gtt is being titrated up. On my assessment, he states he still had 8/10 chest pressure despite NTG drip, but also while eating his lunch. Pt does not appear in distress.   Stat EKG appears stable, still with bradycardia and bifascicular block. However bradycardia may suggest RCA etiology.   Case discussed with Dr. Servando Salina. I have ordered 2 mg IV morphine and another set of cardiac troponin x 2. Will continue to titrate NTG and give him one hour. If we are unable to control his chest pain with increasing NTG and morphine, will likely need to activate cath lab today.   Marcelino Duster, PA-C 11/24/2023, 1:30 PM [phone] [facility] [address] [place], [place] [address]

## 2023-11-24 NOTE — Progress Notes (Signed)
  Order obtained to start nitroglycerine for ongoing chest pain, patient had only one IV access and he was getting heparin infusion through that so, confirmed with the pharmacist about compatibility between NTG with 5D and heparin, they are compatible as per her.  Nitroglycerine started as per the order, patient continues to complain of chest pain and tightness 8/10,.  Blood pressure will be monitor every , patient is still bradycardiac, denies dizziness, lightheadedness.   11/24/23 1205  Vitals  BP (!) 140/61  MAP (mmHg) 84  BP Location Right Arm  BP Method Automatic  Patient Position (if appropriate) Lying  Pulse Rate (!) 56  Pulse Rate Source Monitor  ECG Heart Rate (!) 57  Resp 20  MEWS COLOR  MEWS Score Color Green  Oxygen Therapy  SpO2 97 %  Pain Assessment  Pain Scale 0-10  Pain Score 8  Pain Type Acute pain  Pain Location Chest  Pain Intervention(s) Medication (See eMAR)  MEWS Score  MEWS Temp 0  MEWS Systolic 0  MEWS Pulse 0  MEWS RR 0  MEWS LOC 0  MEWS Score 0

## 2023-11-24 NOTE — Interval H&P Note (Signed)
 History and Physical Interval Note:  11/24/2023 4:25 PM  [name]  has presented today for surgery, with the diagnosis of NSTEMI.  The various methods of treatment have been discussed with the patient and family. After consideration of risks, benefits and other options for treatment, the patient has consented to  Procedure(s): LEFT HEART CATH AND CORONARY ANGIOGRAPHY (N/A) as a surgical intervention.  The patient's history has been reviewed, patient examined, no change in status, stable for surgery.  I have reviewed the patient's chart and labs.  Questions were answered to the patient's satisfaction.   Cath Lab Visit (complete for each Cath Lab visit)  Clinical Evaluation Leading to the Procedure:   ACS: Yes.    Non-ACS:    Anginal Classification: CCS IV  Anti-ischemic medical therapy: Maximal Therapy (2 or more classes of medications)  Non-Invasive Test Results: No non-invasive testing performed  Prior CABG: No previous CABG        [name] 11/24/2023 4:25 PM

## 2023-11-24 NOTE — Progress Notes (Signed)
 Rounding Note    Patient Name: [name] Date of Encounter: 11/24/2023  [facility] Cardiologist: None   Subjective   He reports persistent chest pressure and shortness of breath.  Looks comfortable  Inpatient Medications    Scheduled Meds:  aspirin EC  81 mg Oral Daily   atorvastatin  40 mg Oral QHS   Chlorhexidine Gluconate Cloth  6 each Topical Q0600   famotidine  20 mg Oral QODAY   gabapentin  100 mg Oral TID   insulin aspart  0-20 Units Subcutaneous TID WC   Continuous Infusions:  heparin 1,400 Units/hr (11/23/23 2211)   nitroGLYCERIN     PRN Meds: albuterol, oxyCODONE-acetaminophen   Vital Signs    Vitals:   11/24/23 0002 11/24/23 0241 11/24/23 0348 11/24/23 0724  BP: (!) 108/47  (!) 110/55 (!) 115/50  Pulse: (!) 59 63 (!) 42 (!) 41  Resp: 16 (!) 23 20 15   Temp: 98.2 F (36.8 C)  98.5 F (36.9 C) 97.7 F (36.5 C)  TempSrc: Oral  Oral Axillary  SpO2: 97% 100% 99% 98%  Weight:  117.3 kg    Height:        Intake/Output Summary (Last 24 hours) at 11/24/2023 1111 Last data filed at 11/24/2023 0757 Gross per 24 hour  Intake 950.9 ml  Output 0 ml  Net 950.9 ml      11/24/2023    2:41 AM 11/23/2023   11:33 AM 11/20/2023   12:54 PM  Last 3 Weights  Weight (lbs) 258 lb 9.6 oz 245 lb --  Weight (kg) 117.3 kg 111.131 kg --      Telemetry    Sinus rhythm- Personally Reviewed  ECG    No new - Personally Reviewed  Physical Exam   Vitals:   11/24/23 0348 11/24/23 0724  BP: (!) 110/55 (!) 115/50  Pulse: (!) 42 (!) 41  Resp: 20 15  Temp: 98.5 F (36.9 C) 97.7 F (36.5 C)  SpO2: 99% 98%    General:  Well nourished, well developed, in no acute distress HEENT: normal Neck: no JVD Vascular: No carotid bruits; Distal pulses 2+ bilaterally Cardiac:  normal S1, S2; RRR; no murmur  Lungs:  clear to auscultation bilaterally, no wheezing, rhonchi or rales  Abd: soft, nontender, no hepatomegaly  Ext: no edema Musculoskeletal:  BL  BKA Skin: warm and dry  Neuro:  CNs 2-12 intact, no focal abnormalities noted Psych:  Normal affect   Labs    High Sensitivity Troponin:   Recent Labs  Lab 11/19/23 1612 11/23/23 1138 11/23/23 1326 11/23/23 1646 11/23/23 1839  TROPONINIHS 169* 336* 381* 442* 418*     Chemistry Recent Labs  Lab 11/19/23 1359 11/19/23 1436 11/20/23 0444 11/23/23 1138 11/23/23 1153 11/24/23 0836  NA 132*   < > 134* 135 135 134*  K 5.2*   < > 5.6* 4.1 4.0 4.2  CL 95*   < > 96* 98 102 94*  CO2 25  --  26 22  --  24  GLUCOSE 129*   < > 120* 163* 160* 225*  BUN 43*   < > 55* 28* 32* 38*  CREATININE 6.39*   < > 7.13* 5.08* 5.50* 6.49*  CALCIUM 7.8*  --  7.9* 7.4*  --  7.9*  PROT 5.8*  --   --  5.2*  --   --   ALBUMIN 3.0*  --   --  2.9*  --  2.9*  AST 15  --   --  20  --   --   ALT 14  --   --  11  --   --   ALKPHOS 75  --   --  74  --   --   BILITOT 0.6  --   --  0.6  --   --   GFRNONAA 9*  --  8* 12*  --  9*  ANIONGAP 12  --  12 15  --  16*   < > = values in this interval not displayed.    Lipids No results for input(s): "CHOL", "TRIG", "HDL", "LABVLDL", "LDLCALC", "CHOLHDL" in the last 168 hours.  Hematology Recent Labs  Lab 11/19/23 2255 11/23/23 1138 11/23/23 1153 11/24/23 0836  WBC 5.4 5.8  --  6.0  RBC 3.23* 2.94*  --  3.09*  HGB 10.2* 9.3* 9.5* 9.8*  HCT 31.5* 29.6* 28.0* 29.6*  MCV 97.5 100.7*  --  95.8  MCH 31.6 31.6  --  31.7  MCHC 32.4 31.4  --  33.1  RDW 14.3 14.0  --  14.1  PLT 148* 118*  --  128*   Thyroid No results for input(s): "TSH", "FREET4" in the last 168 hours.  BNPNo results for input(s): "BNP", "PROBNP" in the last 168 hours.  DDimer No results for input(s): "DDIMER" in the last 168 hours.   Radiology    DG Chest Port 1 View Result Date: 11/23/2023 CLINICAL DATA:  Weakness and chest pain. EXAM: PORTABLE CHEST 1 VIEW COMPARISON:  11/19/2023 and 09/22/2023.  CT, 07/18/2023. FINDINGS: Cardiac silhouette borderline enlarged. No mediastinal or hilar  masses. Hazy lung base opacities are noted accentuated by relatively low lung volumes. Remainder of the lungs is clear. Suspect small effusions. No pneumothorax. Skeletal structures are grossly intact. IMPRESSION: Low lung volumes with hazy lung base opacities suspected to be small effusions with atelectasis. Cannot exclude pneumonia. No pulmonary edema. Electronically Signed   By: [name] M.D.   On: 11/23/2023 11:57    Cardiac Studies   TTE 03/31/2023 1. Left ventricular ejection fraction, by estimation, is 60 to 65%. The  left ventricle has normal function. The left ventricle has no regional  wall motion abnormalities. There is mild concentric left ventricular  hypertrophy. Left ventricular diastolic  parameters are consistent with Grade I diastolic dysfunction (impaired  relaxation). Elevated left atrial pressure.   2. Right ventricular systolic function is normal. The right ventricular  size is normal.   3. Left atrial size was moderately dilated.   4. The mitral valve is normal in structure. No evidence of mitral valve  regurgitation. No evidence of mitral stenosis. Moderate mitral annular  calcification.   5. The aortic valve is tricuspid. Aortic valve regurgitation is not  visualized. Aortic valve sclerosis/calcification is present, without any  evidence of aortic stenosis.   6. The inferior vena cava is dilated in size with >50% respiratory  variability, suggesting right atrial pressure of 8 mmHg.    LHC 09/16/2018 LM: Normal LCx: Normal LAD: High Diag 1 40-50% disease LCx: Normal RCA: Patent prox to distal RCA stents. Mild 20% prox late lumen loss LVEDP 32 mmHg   Conclusion: Nonobstructive CAD with patent RCA stents Elevated LVEDP 32 mmHg Hypertensive emergency    Assessment & Plan    [name] was recently admitted a few days ago.  He was coming in with hypotension and bradycardia to the 40s.  He had missed a dialysis session last Thursday and felt very short of  breath prior to  his planned dialysis on Saturday.  He underwent 45 minutes of dialysis and then the session was stopped due to hypotension. At that time he was on amiodarone and labetalol.  His labetalol was subsequently stopped.  His amiodarone was continued.  His antihypertensives were adjusted as well.  He was planned to stop hydralazine, labetalol and amlodipine at least for the short-term.  He then returned to the emergency room on 3/15 with sinus bradycardia in the 40s still.  He also was reporting persistent chest pressure  Possible NSTEMI Hx of RCA PCI 2016 He reports persistent chest pressure at rest, he was recently admitted and now returns for this same issue.  He is certainly high risk for ACS. Trop 336-> 381  Troponin level can be elevated in patients with dialysis.  However with risk we will plan to manage as NSTEMI.  Has possible in-stent stenosis with sinus bradycardia -Will plan for left heart cath on Monday; n.p.o. at midnight -Continue heparin gtt -Added a nitro drip for chest pain -Hold Eliquis -Continue Lipitor - asa prior to cath -No beta-blocker with sinus bradycardia   Informed Consent Shared Decision Making/Informed Consent The risks [stroke (1 in 1000), death (1 in 1000), kidney failure [usually temporary] (1 in 500), bleeding (1 in 200), allergic reaction [possibly serious] (1 in 200)], benefits (diagnostic support and management of coronary artery disease) and alternatives of a cardiac catheterization were discussed in detail with Mr. Zeiter and he is willing to proceed.    PAF -Sinus bradycardia -Holding Eliquis     Sinus Bradycardia He is currently stable.  Labetalol was recently stopped.  Can stop his amiodarone.  No indication for a pacemaker.    Other Macrocytic anemia Chronic Thrombocytopenia; Plt>50    For questions or updates, please contact [facility] Please consult [email or web address] for contact info under        Signed, Maisie Fus,  MD  11/24/2023, 11:11 AM

## 2023-11-24 NOTE — Progress Notes (Signed)
 HD#0 SUBJECTIVE:  Patient Summary:  [name] is a 68 y.o. T2DM, ESRD (TTS HD), HFpEF, Afib, PAD s/p b/l BKA who presents with hypotension and bradycardia and chest pain, concerning for ischemia and scheduled for CAT tomorrow .  Overnight Events: NAEO  Interim History: Patient remains stable. Seen at bedside today,didn't have any concerns. Continues to reports chest heaviness.  OBJECTIVE:  Vital Signs: Vitals:   11/24/23 0002 11/24/23 0241 11/24/23 0348 11/24/23 0724  BP: (!) 108/47  (!) 110/55 (!) 115/50  Pulse: (!) 59 63 (!) 42 (!) 41  Resp: 16 (!) 23 20 15   Temp: 98.2 F (36.8 C)  98.5 F (36.9 C) 97.7 F (36.5 C)  TempSrc: Oral  Oral Axillary  SpO2: 97% 100% 99% 98%  Weight:  117.3 kg    Height:       Supplemental O2: Nasal Cannula SpO2: 98 %  Filed Weights   11/23/23 1133 11/24/23 0241  Weight: 111.1 kg 117.3 kg     Intake/Output Summary (Last 24 hours) at 11/24/2023 1044 Last data filed at 11/24/2023 0757 Gross per 24 hour  Intake 950.9 ml  Output 0 ml  Net 950.9 ml   Net IO Since Admission: 950.9 mL [11/24/23 1044]  Physical Exam: Physical Exam Constitutional:      Appearance: Normal appearance. He is obese.  HENT:     Head: Normocephalic and atraumatic.  Cardiovascular:     Rate and Rhythm: Normal rate and regular rhythm.  Pulmonary:     Comments: On 2L [place]. Crackles heard  Neurological:     General: No focal deficit present.     Mental Status: He is alert and oriented to person, place, and time.  Psychiatric:        Mood and Affect: Mood normal.        Behavior: Behavior normal.     Patient Lines/Drains/Airways Status     Active Line/Drains/Airways     Name Placement date Placement time Site Days   Peripheral IV 11/24/23 22 G 1" Right;Lateral Wrist 11/24/23  0340  Wrist  less than 1   Fistula / Graft Left Upper arm Arteriovenous fistula 07/21/20  0825  Upper arm  1221   External Urinary Catheter 11/23/23  1850  --  1   Wound /  Incision (Open or Dehisced) 03/30/23 (IAD) Incontinence Associated Dermatitis;Skin tear Sacrum Bilateral brief saturated upon admission to unit. small areas 2x2 cm of skin tear on bilateral buttocks, cleansed and mepilex foam applied to protect s 03/30/23  2150  Sacrum  239   Wound / Incision (Open or Dehisced) 07/18/23 (IAD) Incontinence Associated Dermatitis;Skin tear Thigh Left;Posterior 4x 5 cm skin tear on posterior of L thigh, mepilex applied 07/18/23  1850  Thigh  129             ASSESSMENT/PLAN:  Assessment: Principal Problem:   Bradycardia Active Problems:   Type 2 diabetes mellitus with diabetic neuropathy (HCC)   S/P BKA (below knee amputation), right (HCC)   Chronic pain   Chest pain   PAF (paroxysmal atrial fibrillation) (HCC)   Plan: #Bradycardia #Chest pains Continues to endorse chest heaviness. Bradycardic at 41 .Trops peaked at 418. Cardiology is following. Plan is for CAT lab tomorrow due to concerns for ischemia. Cards rec appreciated. Follow up post-op . Will continue to hold amiodarone.    #ESRD - Continue scheduled dialysis    #Chronic diastolic heart failure Volume management with HD   #Chronic pain #Peripheral Neuropathy Continue home  Percocet  Continue home gabapentin    #PAF - On heparin    #Bilateral BKA No active concerns    #Hypertension Continue to hold antihypertensives    Best Practice: Diet: Regular diet IVF: Fluids: none , Rate: None VTE: Heparin  Code: Full AB: none  Family Contact: Wife , called and notified. DISPO: Anticipated discharge in 2 days to Nursing Home pending  pending Cath .  Signature: Kathleen Lime , MD Internal Medicine Resident, PGY-1 [facility]  Pager: [phone] 10:44 AM, 11/24/2023   Please contact the on call pager after 5 pm and on weekends at [phone].

## 2023-11-24 NOTE — Progress Notes (Signed)
 [facility] Progress Note   Subjective:   Pt seen in room, states he is feeling "rough." Reports ongoing fatigue and tightness in his chest. He was started on heparin drip overnight. Reports dizziness and shortness of breath with exertion. Denies abdominal pain, N/V/D. Did not have HD last night due to high census.   Objective Vitals:   11/24/23 0002 11/24/23 0241 11/24/23 0348 11/24/23 0724  BP: (!) 108/47  (!) 110/55 (!) 115/50  Pulse: (!) 59 63 (!) 42 (!) 41  Resp: 16 (!) 23 20 15   Temp: 98.2 F (36.8 C)  98.5 F (36.9 C) 97.7 F (36.5 C)  TempSrc: Oral  Oral Axillary  SpO2: 97% 100% 99% 98%  Weight:  117.3 kg    Height:       Physical Exam General: Alert male in NAD Heart: Bradycardic, regular rhythm, no murmur  Lungs: CTA bilaterally, respirations unlabored on O2 [address] [place] Abdomen: Soft, non-distended, +BS Extremities: Trace stump edema bilaterally Dialysis Access: AVF + t/b  Additional Objective Labs: Basic Metabolic Panel: Recent Labs  Lab 11/20/23 0444 11/23/23 1138 11/23/23 1153 11/24/23 0836  NA 134* 135 135 134*  K 5.6* 4.1 4.0 4.2  CL 96* 98 102 94*  CO2 26 22  --  24  GLUCOSE 120* 163* 160* 225*  BUN 55* 28* 32* 38*  CREATININE 7.13* 5.08* 5.50* 6.49*  CALCIUM 7.9* 7.4*  --  7.9*  PHOS  --   --   --  6.5*   Liver Function Tests: Recent Labs  Lab 11/19/23 1359 11/23/23 1138 11/24/23 0836  AST 15 20  --   ALT 14 11  --   ALKPHOS 75 74  --   BILITOT 0.6 0.6  --   PROT 5.8* 5.2*  --   ALBUMIN 3.0* 2.9* 2.9*   Recent Labs  Lab 11/19/23 1359  LIPASE 23   CBC: Recent Labs  Lab 11/19/23 1359 11/19/23 1436 11/19/23 2255 11/23/23 1138 11/23/23 1153 11/24/23 0836  WBC 5.0  --  5.4 5.8  --  6.0  NEUTROABS 3.3  --   --  4.3  --   --   HGB 9.9*   < > 10.2* 9.3* 9.5* 9.8*  HCT 30.1*   < > 31.5* 29.6* 28.0* 29.6*  MCV 96.5  --  97.5 100.7*  --  95.8  PLT 146*  --  148* 118*  --  128*   < > = values in this interval not  displayed.   Blood Culture    Component Value Date/Time   SDES BLOOD SITE NOT SPECIFIED 11/19/2023 1805   SPECREQUEST  11/19/2023 1805    BOTTLES DRAWN AEROBIC AND ANAEROBIC Blood Culture results may not be optimal due to an inadequate volume of blood received in culture bottles   CULT  11/19/2023 1805    NO GROWTH 5 DAYS Performed at [facility], [address], [place], [place] [address]    REPTSTATUS 11/24/2023 FINAL 11/19/2023 1805    Cardiac Enzymes: No results for input(s): "CKTOTAL", "CKMB", "CKMBINDEX", "TROPONINI" in the last 168 hours. CBG: Recent Labs  Lab 11/19/23 2242 11/20/23 1354 11/23/23 1643 11/23/23 2118 11/24/23 0634  GLUCAP 194* 161* 210* 162* 155*   Iron Studies: No results for input(s): "IRON", "TIBC", "TRANSFERRIN", "FERRITIN" in the last 72 hours. @lablastinr3 @ Studies/Results: DG Chest Port 1 View Result Date: 11/23/2023 CLINICAL DATA:  Weakness and chest pain. EXAM: PORTABLE CHEST 1 VIEW COMPARISON:  11/19/2023 and 09/22/2023.  CT, 07/18/2023.  FINDINGS: Cardiac silhouette borderline enlarged. No mediastinal or hilar masses. Hazy lung base opacities are noted accentuated by relatively low lung volumes. Remainder of the lungs is clear. Suspect small effusions. No pneumothorax. Skeletal structures are grossly intact. IMPRESSION: Low lung volumes with hazy lung base opacities suspected to be small effusions with atelectasis. Cannot exclude pneumonia. No pulmonary edema. Electronically Signed   By: Amie Portland M.D.   On: 11/23/2023 11:57   Medications:  heparin 1,400 Units/hr (11/23/23 2211)    aspirin EC  81 mg Oral Daily   atorvastatin  40 mg Oral QHS   Chlorhexidine Gluconate Cloth  6 each Topical Q0600   famotidine  20 mg Oral QODAY   gabapentin  100 mg Oral TID   insulin aspart  0-20 Units Subcutaneous TID WC    Dialysis Orders: [facility] TTS 4:15 450/800 EDW 107kg 2K/2Ca  AVF No heparin Not reaching EDW. Post HD wt 3/8 - 117kg -Mircera 150  q 2 wks (last dose 11/21/23) -Hectorol 2 q HD    Assessment/Plan:  Sinus bradycardia; Recent admission for the same. Beta blocker was discontinued. Cardiology is on board. On midodrine pre-HD, will hold for now until bradycardia improves. Mgt per cardiology, planned for heart cath tomorrow.   ESRD:  Typically on TTS schedule, missed Saturday due to above events. No emergent indication for HD at present, K+ is 4.2, O2 sat at goal.  Currently on heparin drip which would need to be stopped to bring him to the HD unit. Will plan for HD tomorrow.   Hypertension/volume: BP soft/stable. He does have stump edema which he reports is chronic. CXR on admission without pulmonary edema  Anemia: Hgb 9.8, received mircera on 3/13.   Metabolic bone disease: Continue VDRA and binders. Corrected Calcium at goal, phos 6.5.   Rogers Blocker, PA-C 11/24/2023, 10:41 AM  [facility] Pager: [phone]

## 2023-11-24 NOTE — Progress Notes (Signed)
  Echocardiogram 2D Echocardiogram has been performed.  Ocie Doyne RDCS 11/24/2023, 3:15 PM

## 2023-11-24 NOTE — Progress Notes (Signed)
 Called to bedside for ongoing 8/10 chest pain. NTG gtt is being titrated up. On my assessment, he states he still had 8/10 chest pressure despite NTG drip, but also while eating his lunch. Pt does not appear in distress.   Stat EKG appears stable, still with bradycardia and bifascicular block. However bradycardia may suggest RCA etiology.   Case discussed with Dr. Servando Salina. I have ordered 2 mg IV morphine and another set of cardiac troponin x 2. Will continue to titrate NTG and give him one hour. If we are unable to control his chest pain with increasing NTG and morphine, will likely need to activate cath lab today.   Marcelino Duster, PA-C 11/24/2023, 1:30 PM [phone] [facility] [address] [place], [place] [address]

## 2023-11-25 ENCOUNTER — Encounter (HOSPITAL_COMMUNITY): Payer: Self-pay | Admitting: Cardiology

## 2023-11-25 DIAGNOSIS — R001 Bradycardia, unspecified: Secondary | ICD-10-CM | POA: Diagnosis not present

## 2023-11-25 DIAGNOSIS — I25700 Atherosclerosis of coronary artery bypass graft(s), unspecified, with unstable angina pectoris: Secondary | ICD-10-CM

## 2023-11-25 DIAGNOSIS — Z992 Dependence on renal dialysis: Secondary | ICD-10-CM | POA: Diagnosis not present

## 2023-11-25 DIAGNOSIS — N186 End stage renal disease: Secondary | ICD-10-CM

## 2023-11-25 DIAGNOSIS — I214 Non-ST elevation (NSTEMI) myocardial infarction: Secondary | ICD-10-CM

## 2023-11-25 LAB — BASIC METABOLIC PANEL
Anion gap: 17 — ABNORMAL HIGH (ref 5–15)
BUN: 47 mg/dL — ABNORMAL HIGH (ref 8–23)
CO2: 21 mmol/L — ABNORMAL LOW (ref 22–32)
Calcium: 7.9 mg/dL — ABNORMAL LOW (ref 8.9–10.3)
Chloride: 91 mmol/L — ABNORMAL LOW (ref 98–111)
Creatinine, Ser: 7.31 mg/dL — ABNORMAL HIGH (ref 0.61–1.24)
GFR, Estimated: 7 mL/min — ABNORMAL LOW (ref >60)
Glucose, Bld: 266 mg/dL — ABNORMAL HIGH (ref 70–99)
Potassium: 5.6 mmol/L — ABNORMAL HIGH (ref 3.5–5.1)
Sodium: 129 mmol/L — ABNORMAL LOW (ref 135–145)

## 2023-11-25 LAB — CBC
HCT: 32.1 % — ABNORMAL LOW (ref 39.0–52.0)
Hemoglobin: 10.8 g/dL — ABNORMAL LOW (ref 13.0–17.0)
MCH: 31.7 pg (ref 26.0–34.0)
MCHC: 33.6 g/dL (ref 30.0–36.0)
MCV: 94.1 fL (ref 80.0–100.0)
Platelets: 143 10*3/uL — ABNORMAL LOW (ref 150–400)
RBC: 3.41 MIL/uL — ABNORMAL LOW (ref 4.22–5.81)
RDW: 14 % (ref 11.5–15.5)
WBC: 6.1 10*3/uL (ref 4.0–10.5)
nRBC: 0 % (ref 0.0–0.2)

## 2023-11-25 LAB — GLUCOSE, CAPILLARY
Glucose-Capillary: 195 mg/dL — ABNORMAL HIGH (ref 70–99)
Glucose-Capillary: 244 mg/dL — ABNORMAL HIGH (ref 70–99)
Glucose-Capillary: 205 mg/dL — ABNORMAL HIGH (ref 70–99)

## 2023-11-25 LAB — APTT: aPTT: 30 s (ref 24–36)

## 2023-11-25 LAB — POCT ACTIVATED CLOTTING TIME: Activated Clotting Time: 147 s

## 2023-11-25 SURGERY — LEFT HEART CATH AND CORONARY ANGIOGRAPHY
Anesthesia: LOCAL

## 2023-11-25 MED ORDER — ASPIRIN 81 MG PO TBEC: 81.0000 mg | DELAYED_RELEASE_TABLET | Freq: Every day | ORAL | Status: AC

## 2023-11-25 MED ORDER — METOPROLOL SUCCINATE ER 25 MG PO TB24
25.0000 mg | ORAL_TABLET | Freq: Every day | ORAL | Status: DC
  Administered 2023-11-25: 25 mg via ORAL
  Filled 2023-11-25: qty 1

## 2023-11-25 MED ORDER — METOPROLOL SUCCINATE ER 25 MG PO TB24: 25.0000 mg | ORAL_TABLET | Freq: Every day | ORAL | Status: DC

## 2023-11-25 MED ORDER — ATORVASTATIN CALCIUM 80 MG PO TABS: 80.0000 mg | ORAL_TABLET | Freq: Every day | ORAL | Status: DC

## 2023-11-25 MED ORDER — FENTANYL 50 MCG/HR TD PT72
1.0000 | MEDICATED_PATCH | TRANSDERMAL | Status: DC
  Administered 2023-11-25: 1 via TRANSDERMAL
  Filled 2023-11-25: qty 1

## 2023-11-25 MED ORDER — ATORVASTATIN CALCIUM 80 MG PO TABS: 80.0000 mg | ORAL_TABLET | Freq: Every day | ORAL | Status: AC

## 2023-11-25 MED ORDER — CLOPIDOGREL BISULFATE 75 MG PO TABS: 75.0000 mg | ORAL_TABLET | Freq: Every day | ORAL | Status: DC

## 2023-11-25 MED ORDER — APIXABAN 5 MG PO TABS
5.0000 mg | ORAL_TABLET | Freq: Two times a day (BID) | ORAL | Status: DC
Start: 2023-11-25 — End: 2023-11-26
  Administered 2023-11-25: 5 mg via ORAL
  Filled 2023-11-25: qty 1

## 2023-11-25 NOTE — Plan of Care (Signed)

## 2023-11-25 NOTE — TOC Transition Note (Addendum)
 Transition of Care [facility]) - Discharge Note   Patient Details  Name: [name] MRN: [id number] Date of Birth: Mar 16, 1956  Transition of Care [facility] CM/SW Contact:  Nicanor Bake Phone Number: [phone] 11/25/2023, 3:34 PM   Clinical Narrative:   HF CSW called and spoke with Toniann Fail from [facility] SN[facility]. Toniann Fail stated that she does not need anything for the pt just an updated dc summary. Facility is aware that the pt hs to complete dialysis and will arrive after. The number for PTAR is [phone]. CSW provided the number to the nurse who stated that she will be able to call once the pt is ready for dc.    Pts room #203  The number to call for report is [phone]      Barriers to Discharge: Continued Medical Work up   Patient Goals and CMS Choice Patient states their goals for this hospitalization and ongoing recovery are:: wants to complete SNF rehab CMS Medicare.gov Compare Post Acute Care list provided to:: Patient Choice offered to / list presented to : Patient [place] ownership interest in [facility].provided to:: Patient    Discharge Placement                       Discharge Plan and Services Additional resources added to the After Visit Summary for     Discharge Planning Services: CM Consult Post Acute Care Choice: Skilled Nursing Facility                               Social Drivers of Health (SDOH) Interventions SDOH Screenings   Food Insecurity: No Food Insecurity (11/23/2023)  Housing: Low Risk  (11/23/2023)  Transportation Needs: No Transportation Needs (11/23/2023)  Utilities: Not At Risk (11/23/2023)  Social Connections: Moderately Isolated (11/23/2023)  Tobacco Use: High Risk (11/23/2023)     Readmission Risk Interventions    05/29/2023    4:41 PM 06/30/2021   11:53 AM  Readmission Risk Prevention Plan  Transportation Screening Complete Complete  Medication Review Oceanographer) Complete Complete  PCP or  Specialist appointment within 3-5 days of discharge Complete   HRI or Home Care Consult Complete Complete  SW Recovery Care/Counseling Consult Complete Complete  Palliative Care Screening Not Applicable Not Applicable  Skilled Nursing Facility Complete Not Applicable

## 2023-11-25 NOTE — Progress Notes (Signed)
 HD#1 SUBJECTIVE:  Patient Summary:  [name] is a 68 y.o. T2DM, ESRD (TTS HD), HFpEF, Afib, PAD s/p b/l BKA who presents with hypotension and bradycardia and chest pain, concerning for ischemia ,POD1 Heart Cath.  Overnight Events: Had a heart cath that showed 99% thrombotic stenosis in the proximal LCx. There were focal lesion within stents in the ostial/proximal RCA and mid RCA and markedly elevated LVEDP 35 mm Hg,  Interim History: Patient seen at bedside. Getting ready to eat breakfast.He reports his chest pain is resolved and wants to go back to his nursing home after dialysis.  OBJECTIVE:  Vital Signs: Vitals:   11/25/23 0630 11/25/23 0700 11/25/23 0730 11/25/23 0811  BP: (!) 71/60 (!) 109/42 (!) 145/88 (!) 156/68  Pulse: 62 62 61 69  Resp: 20 12  18   Temp:    98.5 F (36.9 C)  TempSrc:    Oral  SpO2: 95% 96% 95% 95%  Weight:      Height:       Supplemental O2: Nasal Cannula SpO2: 95 % O2 Flow Rate (L/min): 4 L/min  Filed Weights   11/23/23 1133 11/24/23 0241 11/25/23 0500  Weight: 111.1 kg 117.3 kg 118.2 kg     Intake/Output Summary (Last 24 hours) at 11/25/2023 1000 Last data filed at 11/25/2023 0800 Gross per 24 hour  Intake 840 ml  Output 500 ml  Net 340 ml   Net IO Since Admission: 1,290.9 mL [11/25/23 1000]  Physical Exam: Physical Exam Constitutional:      Appearance: Normal appearance. He is obese.  HENT:     Head: Normocephalic and atraumatic.  Cardiovascular:     Rate and Rhythm: Normal rate and regular rhythm.     Comments: Mildly volume overloaded.Cath site looks dry,clean and intact Musculoskeletal:     Comments: Bilateral BKA   Neurological:     General: No focal deficit present.     Mental Status: He is alert and oriented to person, place, and time.  Psychiatric:        Mood and Affect: Mood normal.        Behavior: Behavior normal.     Patient Lines/Drains/Airways Status     Active Line/Drains/Airways     Name Placement date  Placement time Site Days   Peripheral IV 11/24/23 22 G 1" Right;Lateral Wrist 11/24/23  0340  Wrist  less than 1   Fistula / Graft Left Upper arm Arteriovenous fistula 07/21/20  0825  Upper arm  1221   External Urinary Catheter 11/23/23  1850  --  1   Wound / Incision (Open or Dehisced) 03/30/23 (IAD) Incontinence Associated Dermatitis;Skin tear Sacrum Bilateral brief saturated upon admission to unit. small areas 2x2 cm of skin tear on bilateral buttocks, cleansed and mepilex foam applied to protect s 03/30/23  2150  Sacrum  239   Wound / Incision (Open or Dehisced) 07/18/23 (IAD) Incontinence Associated Dermatitis;Skin tear Thigh Left;Posterior 4x 5 cm skin tear on posterior of L thigh, mepilex applied 07/18/23  1850  Thigh  129             ASSESSMENT/PLAN:  Assessment: Principal Problem:   Bradycardia Active Problems:   Type 2 diabetes mellitus with diabetic neuropathy (HCC)   S/P BKA (below knee amputation), right (HCC)   Chronic pain   Chest pain   PAF (paroxysmal atrial fibrillation) (HCC)   Plan: #Bradycardia #Chest pains #NSTEMI Chest pain resolved post cath which reassures me that his coronary artery occlusion was  most likely causing his symptoms.I appreciate cardiology's assistance.No longer bradycardic . Will continue DAPT with ASA 81 mg for one month, Plavix 75 mg daily for 12 months per cards.   #ESRD #Electrolytes derangement - Suspects electrolytes derangement is mostly due to not being dialyzed for many days. Will need dialysis today .F/U with nephrology    #Chronic diastolic heart failure Volume management with HD   #Chronic pain #Peripheral Neuropathy Continue home Percocet  Continue home gabapentin    #PAF - On heparin injections    #Bilateral BKA No active concerns    #Hypertension BP 156/68 but I anticipate it will go down after dialysis .Continue to hold antihypertensives until after dialysis    Best Practice: Diet: Regular diet IVF:  Fluids: none , Rate: None VTE: heparin injection 5,000 Units Start: 11/25/23 0600Heparin  Code: Full AB: none  Family Contact: Wife , called and notified. DISPO: Anticipated discharge in 1 days to Nursing Home pending  pending dialysis .  Signature: Kathleen Lime , MD Internal Medicine Resident, PGY-1 [facility]  Pager: [phone] 10:00 AM, 11/25/2023   Please contact the on call pager after 5 pm and on weekends at [phone].

## 2023-11-25 NOTE — Progress Notes (Signed)
 Received patient in bed to unit.  Alert and oriented.  Informed consent signed and in chart.   TX duration:3.25  Patient tolerated well.  Transported back to the room  Alert, without acute distress.  Hand-off given to patient's nurse.   Access used: LAVF Access issues: scabbing and skin breakdown  Total UF removed: 4.8L - >5kg above EDW Medication(s) given: none   11/25/23 1825  Vitals  Temp 98.6 F (37 C)  BP (!) 142/59  MAP (mmHg) 82  Pulse Rate 69  ECG Heart Rate 69  Resp 19  Oxygen Therapy  SpO2 94 %  During Treatment Monitoring  Blood Flow Rate (mL/min) 399 mL/min  Arterial Pressure (mmHg) -171.5 mmHg  Venous Pressure (mmHg) 219.59 mmHg  TMP (mmHg) 11.51 mmHg  Ultrafiltration Rate (mL/min) 609 mL/min  Dialysate Flow Rate (mL/min) 299 ml/min  Duration of HD Treatment -hour(s) 3.21 hour(s)  Cumulative Fluid Removed (mL) per Treatment  4822.85  HD Safety Checks Performed Yes  Intra-Hemodialysis Comments Tx completed (tx completed per pt request)  Dialysis Fluid Bolus Normal Saline  Bolus Amount (mL) 300 mL  Post Treatment  Dialyzer Clearance Lightly streaked  Liters Processed 78  Fluid Removed (mL) 4800 mL  Tolerated HD Treatment Yes  AVG/AVF Arterial Site Held (minutes) 5 minutes  AVG/AVF Venous Site Held (minutes) 5 minutes  Fistula / Graft Left Upper arm Arteriovenous fistula  Placement Date/Time: 07/21/20 0825   Placed prior to admission: No  Orientation: Left  Access Location: Upper arm  Access Type: Arteriovenous fistula  Fistula / Graft Assessment Present;Thrill;Bruit  Status Deaccessed      Freddi Starr, RN [facility]

## 2023-11-25 NOTE — Discharge Summary (Signed)
 Name: [name] MRN: [id number] DOB: 09/25/1955 68 y.o. PCP: Lonie Peak, PA-C  Date of Admission: 11/23/2023 11:26 AM Date of Discharge: 11/25/2023 Attending Physician: Dr.  Lafonda Mosses  Discharge Diagnosis: Principal Problem:   NSTEMI (non-ST elevated myocardial infarction) [facility]) Active Problems:   Type 2 diabetes mellitus with diabetic neuropathy (HCC)   S/P BKA (below knee amputation), right (HCC)   Chronic pain   PAF (paroxysmal atrial fibrillation) (HCC)    Discharge Medications: Allergies as of 11/25/2023       Reactions   Contrast Media [iodinated Contrast Media] Shortness Of Breath, Other (See Comments)   Difficulty breathing and altered mental status   Ivp Dye [iodinated Contrast Media] Anaphylaxis, Shortness Of Breath, Other (See Comments)   Breathing problems, altered mental state   Latex Rash, Other (See Comments)   A severe rash appears after the first 24 hours of being placed   Tape Other (See Comments), Rash   Rash after 1 day of use Do not leave on longer then 3 days with out be changed        Medication List     PAUSE taking these medications    amLODipine 10 MG tablet Wait to take this until your doctor or other care provider tells you to start again. Commonly known as: NORVASC Take 10 mg by mouth in the morning.   hydrALAZINE 50 MG tablet Wait to take this until your doctor or other care provider tells you to start again. Commonly known as: APRESOLINE Take 50 mg by mouth every 8 (eight) hours.   isosorbide mononitrate 60 MG 24 hr tablet Wait to take this until your doctor or other care provider tells you to start again. Commonly known as: IMDUR Take 1 tablet (60 mg total) by mouth daily.       STOP taking these medications    labetalol 200 MG tablet Commonly known as: NORMODYNE       TAKE these medications    amiodarone 200 MG tablet Commonly known as: PACERONE Take 1 tablet (200 mg total) by mouth daily. What changed: when  to take this   apixaban 5 MG Tabs tablet Commonly known as: ELIQUIS Take 1 tablet (5 mg total) by mouth 2 (two) times daily.   aspirin EC 81 MG tablet Take 1 tablet (81 mg total) by mouth daily. Swallow whole.   atorvastatin 80 MG tablet Commonly known as: LIPITOR Take 1 tablet (80 mg total) by mouth at bedtime. What changed:  medication strength how much to take   clopidogrel 75 MG tablet Commonly known as: PLAVIX Take 1 tablet (75 mg total) by mouth daily with breakfast.   Dialyvite/Zinc Tabs Take 1 tablet by mouth Every Tuesday,Thursday,and Saturday with dialysis.   famotidine 20 MG tablet Commonly known as: PEPCID Take 20 mg by mouth every other day.   fentaNYL 50 MCG/HR Commonly known as: DURAGESIC 1 patch every 3 (three) days.   folic acid 1 MG tablet Commonly known as: FOLVITE Take 1 mg by mouth in the morning.   gabapentin 100 MG capsule Commonly known as: NEURONTIN Take 100 mg by mouth 3 (three) times daily.   Icosapent Ethyl 0.5 g Caps Take 1 capsule by mouth in the morning.   lidocaine-prilocaine cream Commonly known as: EMLA Apply 1 application topically See admin instructions. Prior to Dialysis days Tuesday,Thursday and saturday   liver oil-zinc oxide 40 % ointment Commonly known as: DESITIN Apply 1 Application topically as needed for irritation.   loratadine  10 MG tablet Commonly known as: CLARITIN Take 10 mg by mouth in the morning.   metoprolol succinate 25 MG 24 hr tablet Commonly known as: TOPROL-XL Take 1 tablet (25 mg total) by mouth daily. Start taking on: November 26, 2023   midodrine 10 MG tablet Commonly known as: PROAMATINE Take 10 mg by mouth daily as needed (SBP less than 100.).   mineral oil-hydrophilic petrolatum ointment Apply 1 Application topically as needed for dry skin or irritation (Upper thigh).   nitroGLYCERIN 0.4 MG SL tablet Commonly known as: NITROSTAT Place 0.4 mg under the tongue every 5 (five) minutes as  needed for chest pain.   NovoLOG FlexPen 100 UNIT/ML FlexPen Generic drug: insulin aspart Inject 0-12 Units into the skin See admin instructions. Inject 4 units into the skin with meals in addition to sliding scale as needed. Inject as per sliding scale: If 0 - 69 = 0 initiate hypoglycemia orders;  70 - 150 = 0;  151 - 200 = 2;  201 - 250 = 4;  251 - 300 = 6;  301 - 350 = 8;  351 - 400 = 10;  401 - 450 = 12.  If >400, give 12 units, recheck in 1 hour. If still >400, call MD/NP for further orders. Subcutaneously with meals in addition to scheduled 4 unites with meals.   insulin aspart 100 UNIT/ML injection Commonly known as: novoLOG Inject 4 Units into the skin 3 (three) times daily with meals.   oxyCODONE-acetaminophen 5-325 MG tablet Commonly known as: PERCOCET/ROXICET Take 1 tablet by mouth every 8 (eight) hours as needed for severe pain.   promethazine 12.5 MG suppository Commonly known as: PHENERGAN Place 12.5 mg rectally daily as needed for nausea or vomiting.   rOPINIRole 2 MG tablet Commonly known as: REQUIP Take 2 mg by mouth at bedtime.   tamsulosin 0.4 MG Caps capsule Commonly known as: FLOMAX Take 0.4 mg by mouth in the morning.        Disposition and follow-up:   [name] was discharged from [facility] in Good condition.  At the hospital follow up visit please address:  1.  Follow-up:  a. For his NSTEMI, he is placed on Aspirin for one month and Plavix for 12 months. Please ensure adherence to this regimen. Assess his chest pain .     b. For his end-stage renal disease, patient promises to continue his scheduled dialysis on Tuesday Thursdays and Saturdays. Kindly continue to encourage patient and his dialysis sessions   2.  Labs / imaging needed at time of follow-up: None  3.  Pending labs/ test needing follow-up:   4.  Medication Changes Aspirin for one month and Plavix for 12 months Resume metoprolol succinate 25 MG 24 hr  tablet 11/26/2023 Labetalol - STOPPED Imbur  - HELD Hydralazine  - HELD Amlodipine - HELD   Follow-up Appointments:  Follow-up Information     Tereso Newcomer T, PA-C Follow up.   Specialties: Cardiology, Physician Assistant Why: Hospital follow-up with Cardiology scheduled for 12/02/2023 at 2:20pm. Please arrive 15 minutes early for check-in. if this date/ times does not work for you, please call our office to reschedule. Contact information: [address] [place] [address] [phone]                 Hospital Course by problem list:  NSTEMI Bradycardia Hypotension This patient presented with hypotension and bradycardia in the 40s.  He also reported chest  discomfort that he described as " heaviness".  His troponins continue to trend up and cardiology was consulted.  Throughout his hospitalization, his chest pain continued to get worse and refractory to nitroglycerin.  Patient was sent to the Cath Lab and found to have:  Severe 2 vessel obstructive CAD. The culprit lesion is a 99% thrombotic stenosis in the proximal LCx. There are focal lesion within stents in the ostial/proximal RCA and mid RCA Markedly elevated LVEDP 35 mm Hg Successful PCI with DES of the proximal LCX Partially successful PCI of the proximal RCA lesion with Shockwave lithotripsy and high pressure [place] balloon - reduced lesion to 50% but incomplete expansion. Unable to cross the lesion in the mid RCA with balloon. Postop, his hypotension and bradycardia had resolved as well as his chest pain.He converted to a-fib, asymptomatic- rates between 90-120 after his cath lab but reverted back to sinus rhythm shortly after. He is otherwise stable and agreed with plans to discharge back to his facility after HD   ESRD On HD Tuesday Thursday and Saturday end-stage renal disease.    Paroxysmal A-fib His Eliquis and amiodarone was continued  Type 2 diabetes with diabetic neuropathy Started on SSI  and monitored blood glucose daily   Paroxysmal A-fib Sinus bradycardia now.  No beta-blockers.  Amiodarone discontinued today as well.  Stopping Eliquis at present in favor of heparin infusion for ACS.   Discharge Subjective: Patient seen at bedside. Getting ready to eat breakfast.He reports his chest pain is resolved and wants to go back to his nursing home after dialysis.    Discharge Exam:   BP (!) 137/54   Pulse 67   Temp 98.9 F (37.2 C) (Oral)   Resp 15   Ht 6\' 2"  (1.88 m)   Wt 118.5 kg   SpO2 96%   BMI 33.54 kg/m  Constitutional: well-appearing man, sitting in chair, in no acute distress HENT: normocephalic atraumatic, mucous membranes moist Eyes: conjunctiva non-erythematous Neck: supple Cardiovascular: regular rate and rhythm, no m/r/g Pulmonary/Chest: normal work of breathing on room air, lungs clear to auscultation bilaterally Abdominal: soft, non-tender, non-distended MSK: normal bulk and tone Neurological: alert & oriented x 3, 5/5 strength in bilateral upper and lower extremities, normal gait Skin: warm and dry Psych: normal mood and affect   Pertinent Labs, Studies, and Procedures:     Latest Ref Rng & Units 11/25/2023    3:02 AM 11/24/2023    8:47 PM 11/24/2023    8:36 AM  CBC  WBC 4.0 - 10.5 K/uL 6.1  8.5  6.0   Hemoglobin 13.0 - 17.0 g/dL 78.2  95.6  9.8   Hematocrit 39.0 - 52.0 % 32.1  33.3  29.6   Platelets 150 - 400 K/uL 143  146  128        Latest Ref Rng & Units 11/25/2023    3:02 AM 11/24/2023    8:47 PM 11/24/2023    8:36 AM  CMP  Glucose 70 - 99 mg/dL 213   086   BUN 8 - 23 mg/dL 47   38   Creatinine 5.78 - 1.24 mg/dL 4.69  6.29  5.28   Sodium 135 - 145 mmol/L 129   134   Potassium 3.5 - 5.1 mmol/L 5.6   4.2   Chloride 98 - 111 mmol/L 91   94   CO2 22 - 32 mmol/L 21   24   Calcium 8.9 - 10.3 mg/dL 7.9   7.9  CARDIAC CATHETERIZATION Addendum Date: 11/25/2023   Ost 1st Diag lesion is 50% stenosed.   Mid RCA to Dist RCA lesion is 80%  stenosed.   Ost RCA to Prox RCA lesion is 90% stenosed.   Ost Cx to Prox Cx lesion is 99% stenosed.   A drug-eluting stent was successfully placed using a STENT SYS SYNERGY XD 3.0X16.   Balloon angioplasty was performed using a BALL SAPPHIRE NC24 3.0X8.   Post intervention, there is a 0% residual stenosis.   Post intervention, there is a 50% residual stenosis.   Post intervention, there is a 80% residual stenosis.   LV end diastolic pressure is severely elevated.   Recommend to resume Apixaban, at currently prescribed dose and frequency on 11/25/2023.   Recommend concurrent antiplatelet therapy of Aspirin 81 mg for 1 month and Clopidogrel 75mg  daily for 12 months . Severe 2 vessel obstructive CAD. The culprit lesion is a 99% thrombotic stenosis in the proximal LCx. There are focal lesion within stents in the ostial/proximal RCA and mid RCA Markedly elevated LVEDP 35 mm Hg Successful PCI with DES of the proximal LCX Partially successful PCI of the proximal RCA lesion with Shockwave lithotripsy and high pressure [place] balloon - reduced lesion to 50% but incomplete expansion. Unable to cross the lesion in the mid RCA with balloon. Plan: DAPT with ASA 81 mg for one month, Plavix 75 mg daily for 12 months. If no bleeding could resume Eliquis tomorrow. Needs more aggressive dialysis with marked elevated EDP. I would treat the residual disease in the RCA medically. There is a high risk for restenosis. If he remains symptomatic would have to consider atherectomy as his only option but this is not ideal in previously stented vessel.   Result Date: 11/25/2023   Ost 1st Diag lesion is 50% stenosed.   Mid RCA to Dist RCA lesion is 80% stenosed.   Ost RCA to Prox RCA lesion is 90% stenosed.   Ost Cx to Prox Cx lesion is 99% stenosed.   A drug-eluting stent was successfully placed using a STENT SYS SYNERGY XD 3.0X16.   Balloon angioplasty was performed using a BALL SAPPHIRE NC24 3.0X8.   Post intervention, there is a 50% residual  stenosis.   Post intervention, there is a 50% residual stenosis.   Post intervention, there is a 80% residual stenosis.   LV end diastolic pressure is severely elevated.   Recommend to resume Apixaban, at currently prescribed dose and frequency on 11/25/2023.   Recommend concurrent antiplatelet therapy of Aspirin 81 mg for 1 month and Clopidogrel 75mg  daily for 12 months . Severe 2 vessel obstructive CAD. The culprit lesion is a 99% thrombotic stenosis in the proximal LCx. There are focal lesion within stents in the ostial/proximal RCA and mid RCA Markedly elevated LVEDP 35 mm Hg Successful PCI with DES of the proximal LCX Partially successful PCI of the proximal RCA lesion with Shockwave lithotripsy and high pressure [place] balloon - reduced lesion to 50% but incomplete expansion. Unable to cross the lesion in the mid RCA with balloon. Plan: DAPT with ASA 81 mg for one month, Plavix 75 mg daily for 12 months. If no bleeding could resume Eliquis tomorrow. Needs more aggressive dialysis with marked elevated EDP. I would treat the residual disease in the RCA medically. There is a high risk for restenosis. If he remains symptomatic would have to consider atherectomy as his only option but this is not ideal in previously stented vessel.   ECHOCARDIOGRAM COMPLETE  Result Date: 11/24/2023    ECHOCARDIOGRAM REPORT   Patient Name:   [name] Date of Exam: 11/24/2023 Medical Rec #:  [id number]     Height:       74.0 in Accession #:    [id number]    Weight:       258.6 lb Date of Birth:  Jan 03, 1956     BSA:          2.424 m Patient Age:    67 years      BP:           131/56 mmHg Patient Gender: M             HR:           60 bpm. Exam Location:  Inpatient Procedure: 2D Echo, Cardiac Doppler and Color Doppler (Both Spectral and Color            Flow Doppler were utilized during procedure). Indications:    Chest pain  History:        Patient has prior history of Echocardiogram examinations, most                 recent  03/31/2023. CAD, PAD, Arrythmias:Atrial Fibrillation and                 Bradycardia; Risk Factors:Sleep Apnea, Dyslipidemia, Diabetes                 and Hypertension. ESRD, carotid disease.  Sonographer:    Vern Claude Referring Phys: [id number] ANGELA NICOLE DUKE IMPRESSIONS  1. Left ventricular ejection fraction, by estimation, is 60 to 65%. The left ventricle has normal function. The left ventricle has no regional wall motion abnormalities. There is mild concentric left ventricular hypertrophy. Left ventricular diastolic parameters are consistent with Grade II diastolic dysfunction (pseudonormalization). Elevated left ventricular end-diastolic pressure.  2. Right ventricular systolic function is normal. The right ventricular size is mildly enlarged. Tricuspid regurgitation signal is inadequate for assessing PA pressure.  3. Left atrial size was mildly dilated.  4. The mitral valve is normal in structure. No evidence of mitral valve regurgitation. No evidence of mitral stenosis.  5. The aortic valve is calcified. There is mild calcification of the aortic valve. There is moderate thickening of the aortic valve. Aortic valve regurgitation is not visualized. Mild aortic valve stenosis. Aortic valve area, by VTI measures 1.40 cm. Aortic valve mean gradient measures 12.0 mmHg. Aortic valve Vmax measures 2.37 m/s.  6. The inferior vena cava is normal in size with greater than 50% respiratory variability, suggesting right atrial pressure of 3 mmHg. FINDINGS  Left Ventricle: Left ventricular ejection fraction, by estimation, is 60 to 65%. The left ventricle has normal function. The left ventricle has no regional wall motion abnormalities. The left ventricular internal cavity size was normal in size. There is  mild concentric left ventricular hypertrophy. Left ventricular diastolic parameters are consistent with Grade II diastolic dysfunction (pseudonormalization). Elevated left ventricular end-diastolic pressure. Right  Ventricle: The right ventricular size is mildly enlarged. No increase in right ventricular wall thickness. Right ventricular systolic function is normal. Tricuspid regurgitation signal is inadequate for assessing PA pressure. Left Atrium: Left atrial size was mildly dilated. Right Atrium: Right atrial size was normal in size. Pericardium: There is no evidence of pericardial effusion. Mitral Valve: The mitral valve is normal in structure. Mild to moderate mitral annular calcification. No evidence of mitral valve regurgitation. No evidence of mitral valve stenosis. MV peak gradient, 10.4  mmHg. The mean mitral valve gradient is 3.5 mmHg. Tricuspid Valve: The tricuspid valve is normal in structure. Tricuspid valve regurgitation is not demonstrated. No evidence of tricuspid stenosis. Aortic Valve: The aortic valve is calcified. There is mild calcification of the aortic valve. There is moderate thickening of the aortic valve. Aortic valve regurgitation is not visualized. Mild aortic stenosis is present. Aortic valve mean gradient measures 12.0 mmHg. Aortic valve peak gradient measures 22.4 mmHg. Aortic valve area, by VTI measures 1.40 cm. Pulmonic Valve: The pulmonic valve was normal in structure. Pulmonic valve regurgitation is trivial. No evidence of pulmonic stenosis. Aorta: The aortic root is normal in size and structure. Venous: The inferior vena cava is normal in size with greater than 50% respiratory variability, suggesting right atrial pressure of 3 mmHg. IAS/Shunts: No atrial level shunt detected by color flow Doppler.  LEFT VENTRICLE PLAX 2D LVIDd:         5.10 cm      Diastology LVIDs:         3.30 cm      LV e' medial:    6.64 cm/s LV PW:         1.20 cm      LV E/e' medial:  20.2 LV IVS:        1.30 cm      LV e' lateral:   8.49 cm/s LVOT diam:     2.30 cm      LV E/e' lateral: 15.8 LV SV:         82 LV SV Index:   34 LVOT Area:     4.15 cm  LV Volumes (MOD) LV vol d, MOD A2C: 252.0 ml LV vol d, MOD A4C:  323.0 ml LV vol s, MOD A2C: 94.6 ml LV vol s, MOD A4C: 103.0 ml LV SV MOD A2C:     157.4 ml LV SV MOD A4C:     323.0 ml LV SV MOD BP:      187.8 ml RIGHT VENTRICLE            IVC RV Basal diam:  5.20 cm    IVC diam: 2.10 cm RV Mid diam:    3.80 cm RV S prime:     9.36 cm/s TAPSE (M-mode): 2.1 cm LEFT ATRIUM             Index        RIGHT ATRIUM           Index LA diam:        4.80 cm 1.98 cm/m   RA Area:     20.10 cm LA Vol (A2C):   83.9 ml 34.61 ml/m  RA Volume:   61.20 ml  25.24 ml/m LA Vol (A4C):   82.6 ml 34.07 ml/m LA Biplane Vol: 84.9 ml 35.02 ml/m  AORTIC VALVE                     PULMONIC VALVE AV Area (Vmax):    1.48 cm      PV Vmax:       1.08 m/s AV Area (Vmean):   1.40 cm      PV Peak grad:  4.6 mmHg AV Area (VTI):     1.40 cm AV Vmax:           236.50 cm/s AV Vmean:          162.500 cm/s AV VTI:            0.589  m AV Peak Grad:      22.4 mmHg AV Mean Grad:      12.0 mmHg LVOT Vmax:         84.20 cm/s LVOT Vmean:        54.800 cm/s LVOT VTI:          0.198 m LVOT/AV VTI ratio: 0.34  AORTA Ao Root diam: 3.40 cm Ao Asc diam:  3.30 cm MITRAL VALVE MV Area (PHT): 2.84 cm     SHUNTS MV Area VTI:   1.74 cm     Systemic VTI:  0.20 m MV Peak grad:  10.4 mmHg    Systemic Diam: 2.30 cm MV Mean grad:  3.5 mmHg MV Vmax:       1.62 m/s MV Vmean:      86.8 cm/s MV Decel Time: 267 msec MV E velocity: 134.00 cm/s MV A velocity: 88.35 cm/s MV E/A ratio:  1.52 Kardie Tobb DO Electronically signed by Thomasene Ripple DO Signature Date/Time: 11/24/2023/5:04:26 PM    Final    DG Chest Port 1 View Result Date: 11/23/2023 CLINICAL DATA:  Weakness and chest pain. EXAM: PORTABLE CHEST 1 VIEW COMPARISON:  11/19/2023 and 09/22/2023.  CT, 07/18/2023. FINDINGS: Cardiac silhouette borderline enlarged. No mediastinal or hilar masses. Hazy lung base opacities are noted accentuated by relatively low lung volumes. Remainder of the lungs is clear. Suspect small effusions. No pneumothorax. Skeletal structures are grossly intact.  IMPRESSION: Low lung volumes with hazy lung base opacities suspected to be small effusions with atelectasis. Cannot exclude pneumonia. No pulmonary edema. Electronically Signed   By: Amie Portland M.D.   On: 11/23/2023 11:57     Discharge Instructions: Discharge Instructions     Amb Referral to Cardiac Rehabilitation   Complete by: As directed    Diagnosis:  Coronary Stents NSTEMI     After initial evaluation and assessments completed: Virtual Based Care may be provided alone or in conjunction with Phase 2 Cardiac Rehab based on patient barriers.: Yes   Intensive Cardiac Rehabilitation (ICR) [facility] location only OR Traditional Cardiac Rehabilitation (TCR) *If criteria for ICR are not met will enroll in TCR [facility] only): Yes   Call MD for:  difficulty breathing, headache or visual disturbances   Complete by: As directed    Diet - low sodium heart healthy   Complete by: As directed    Diet - low sodium heart healthy   Complete by: As directed    Discharge instructions   Complete by: As directed    To [name] or their caretakers,  They were admitted to [facility] on 11/23/2023 for evaluation and treatment of:  Principal Problem:   NSTEMI (non-ST elevated myocardial infarction) [facility]) Active Problems:   Type 2 diabetes mellitus with diabetic neuropathy (HCC)   S/P BKA (below knee amputation), right (HCC)   Chronic pain   PAF (paroxysmal atrial fibrillation) (HCC)  Resolved Problems:   Hypertension   Bradycardia  The evaluation suggested Chest pain . They were treated for NSTEMI with a Cath.  They were discharged from the hospital on 11/25/23. I recommend the following after leaving the hospital: Remember take your aspirin and plavix as discussed. Be sure to remind your facility nurse if you think you are not getting it.   For questions about your care plan, until you are able to see your primary doctor: Call [phone]. Dial 0 for the operator. Ask  for the internal medicine resident on call.  Kathleen Lime. MD 11/25/2023, 11:23 AM   Increase activity slowly   Complete by: As directed    Increase activity slowly   Complete by: As directed    No wound care   Complete by: As directed    No wound care   Complete by: As directed        Signed: Kathleen Lime, MD Internal Medicine Teaching Service Pager [phone]

## 2023-11-25 NOTE — Progress Notes (Signed)
 D/C order noted. Contacted [facility] to be advised of pt's d/c today and that pt should resume care tomorrow.   Olivia Canter Renal Navigator [phone]

## 2023-11-25 NOTE — Progress Notes (Addendum)
 Rounding Note    Patient Name: [name] Date of Encounter: 11/25/2023  [facility] Cardiologist: None   Subjective   Went for cath yesterday due to ongoing CP  Cath with severe 2v CAD -> PCI DES prox LCX and partially successful PTCA/Shockwave lithotripsy to pRCA. Unable to cross lesion in distal RCA LVEDP 35  Resting comfortably in bed eating lunch.   Inpatient Medications    Scheduled Meds:  apixaban  5 mg Oral BID   aspirin EC  81 mg Oral Daily   atorvastatin  80 mg Oral QHS   Chlorhexidine Gluconate Cloth  6 each Topical Q0600   Chlorhexidine Gluconate Cloth  6 each Topical Daily   clopidogrel  75 mg Oral Q breakfast   famotidine  20 mg Oral QODAY   fentaNYL  1 patch Transdermal Q72H   gabapentin  100 mg Oral TID   insulin aspart  0-20 Units Subcutaneous TID WC   metoprolol succinate  25 mg Oral Daily   mupirocin ointment  1 Application Nasal BID   sodium chloride flush  3 mL Intravenous Q12H   Continuous Infusions:  sodium chloride     PRN Meds: sodium chloride, acetaminophen, albuterol, mouth rinse, oxyCODONE-acetaminophen, sodium chloride flush   Vital Signs    Vitals:   11/25/23 1100 11/25/23 1142 11/25/23 1200 11/25/23 1308  BP: (!) 128/48  (!) 152/71 114/69  Pulse: (!) 47  70 67  Resp: 13  (!) 22 20  Temp:  98.8 F (37.1 C)    TempSrc:  Oral    SpO2: 93%  95% 95%  Weight:      Height:        Intake/Output Summary (Last 24 hours) at 11/25/2023 1334 Last data filed at 11/25/2023 1200 Gross per 24 hour  Intake 1080 ml  Output 500 ml  Net 580 ml      11/25/2023    5:00 AM 11/24/2023    2:41 AM 11/23/2023   11:33 AM  Last 3 Weights  Weight (lbs) 260 lb 9.3 oz 258 lb 9.6 oz 245 lb  Weight (kg) 118.2 kg 117.3 kg 111.131 kg      Telemetry    NSR 60s-70s (Personally reviewed)    Physical Exam   Vitals:   11/25/23 1200 11/25/23 1308  BP: (!) 152/71 114/69  Pulse: 70 67  Resp: (!) 22 20  Temp:    SpO2: 95% 95%    General:  well appearing.  No respiratory difficulty HEENT: normal Neck: supple. JVD flat.  Cor: PMI nondisplaced. Regular rate & rhythm. No rubs, gallops or murmurs. Lungs: clear Extremities: no cyanosis, clubbing, rash, edema. bilateral BKA Neuro: alert & oriented x 3. Affect pleasant.   Labs   High Sensitivity Troponin:   Recent Labs  Lab 11/23/23 1326 11/23/23 1646 11/23/23 1839 11/24/23 1359 11/24/23 1504  TROPONINIHS 381* 442* 418* 323* 319*     Chemistry Recent Labs  Lab 11/19/23 1359 11/19/23 1436 11/23/23 1138 11/23/23 1153 11/24/23 0836 11/24/23 2047 11/25/23 0302  NA 132*   < > 135 135 134*  --  129*  K 5.2*   < > 4.1 4.0 4.2  --  5.6*  CL 95*   < > 98 102 94*  --  91*  CO2 25   < > 22  --  24  --  21*  GLUCOSE 129*   < > 163* 160* 225*  --  266*  BUN 43*   < > 28*  32* 38*  --  47*  CREATININE 6.39*   < > 5.08* 5.50* 6.49* 7.05* 7.31*  CALCIUM 7.8*   < > 7.4*  --  7.9*  --  7.9*  PROT 5.8*  --  5.2*  --   --   --   --   ALBUMIN 3.0*  --  2.9*  --  2.9*  --   --   AST 15  --  20  --   --   --   --   ALT 14  --  11  --   --   --   --   ALKPHOS 75  --  74  --   --   --   --   BILITOT 0.6  --  0.6  --   --   --   --   GFRNONAA 9*   < > 12*  --  9* 8* 7*  ANIONGAP 12   < > 15  --  16*  --  17*   < > = values in this interval not displayed.    Lipids No results for input(s): "CHOL", "TRIG", "HDL", "LABVLDL", "LDLCALC", "CHOLHDL" in the last 168 hours.  Hematology Recent Labs  Lab 11/24/23 0836 11/24/23 2047 11/25/23 0302  WBC 6.0 8.5 6.1  RBC 3.09* 3.48* 3.41*  HGB 9.8* 10.8* 10.8*  HCT 29.6* 33.3* 32.1*  MCV 95.8 95.7 94.1  MCH 31.7 31.0 31.7  MCHC 33.1 32.4 33.6  RDW 14.1 14.0 14.0  PLT 128* 146* 143*   Thyroid No results for input(s): "TSH", "FREET4" in the last 168 hours.  BNPNo results for input(s): "BNP", "PROBNP" in the last 168 hours.  DDimer No results for input(s): "DDIMER" in the last 168 hours.   Radiology    CARDIAC  CATHETERIZATION Addendum Date: 11/25/2023   Ost 1st Diag lesion is 50% stenosed.   Mid RCA to Dist RCA lesion is 80% stenosed.   Ost RCA to Prox RCA lesion is 90% stenosed.   Ost Cx to Prox Cx lesion is 99% stenosed.   A drug-eluting stent was successfully placed using a STENT SYS SYNERGY XD 3.0X16.   Balloon angioplasty was performed using a BALL SAPPHIRE NC24 3.0X8.   Post intervention, there is a 0% residual stenosis.   Post intervention, there is a 50% residual stenosis.   Post intervention, there is a 80% residual stenosis.   LV end diastolic pressure is severely elevated.   Recommend to resume Apixaban, at currently prescribed dose and frequency on 11/25/2023.   Recommend concurrent antiplatelet therapy of Aspirin 81 mg for 1 month and Clopidogrel 75mg  daily for 12 months . Severe 2 vessel obstructive CAD. The culprit lesion is a 99% thrombotic stenosis in the proximal LCx. There are focal lesion within stents in the ostial/proximal RCA and mid RCA Markedly elevated LVEDP 35 mm Hg Successful PCI with DES of the proximal LCX Partially successful PCI of the proximal RCA lesion with Shockwave lithotripsy and high pressure Archer balloon - reduced lesion to 50% but incomplete expansion. Unable to cross the lesion in the mid RCA with balloon. Plan: DAPT with ASA 81 mg for one month, Plavix 75 mg daily for 12 months. If no bleeding could resume Eliquis tomorrow. Needs more aggressive dialysis with marked elevated EDP. I would treat the residual disease in the RCA medically. There is a high risk for restenosis. If he remains symptomatic would have to consider atherectomy as his only option but this is not  ideal in previously stented vessel.   Result Date: 11/25/2023   Ost 1st Diag lesion is 50% stenosed.   Mid RCA to Dist RCA lesion is 80% stenosed.   Ost RCA to Prox RCA lesion is 90% stenosed.   Ost Cx to Prox Cx lesion is 99% stenosed.   A drug-eluting stent was successfully placed using a STENT SYS SYNERGY XD  3.0X16.   Balloon angioplasty was performed using a BALL SAPPHIRE NC24 3.0X8.   Post intervention, there is a 50% residual stenosis.   Post intervention, there is a 50% residual stenosis.   Post intervention, there is a 80% residual stenosis.   LV end diastolic pressure is severely elevated.   Recommend to resume Apixaban, at currently prescribed dose and frequency on 11/25/2023.   Recommend concurrent antiplatelet therapy of Aspirin 81 mg for 1 month and Clopidogrel 75mg  daily for 12 months . Severe 2 vessel obstructive CAD. The culprit lesion is a 99% thrombotic stenosis in the proximal LCx. There are focal lesion within stents in the ostial/proximal RCA and mid RCA Markedly elevated LVEDP 35 mm Hg Successful PCI with DES of the proximal LCX Partially successful PCI of the proximal RCA lesion with Shockwave lithotripsy and high pressure Lesslie balloon - reduced lesion to 50% but incomplete expansion. Unable to cross the lesion in the mid RCA with balloon. Plan: DAPT with ASA 81 mg for one month, Plavix 75 mg daily for 12 months. If no bleeding could resume Eliquis tomorrow. Needs more aggressive dialysis with marked elevated EDP. I would treat the residual disease in the RCA medically. There is a high risk for restenosis. If he remains symptomatic would have to consider atherectomy as his only option but this is not ideal in previously stented vessel.   ECHOCARDIOGRAM COMPLETE Result Date: 11/24/2023    ECHOCARDIOGRAM REPORT   Patient Name:   [name] Date of Exam: 11/24/2023 Medical Rec #:  [id number]     Height:       74.0 in Accession #:    [id number]    Weight:       258.6 lb Date of Birth:  1955-11-02     BSA:          2.424 m Patient Age:    67 years      BP:           131/56 mmHg Patient Gender: M             HR:           60 bpm. Exam Location:  Inpatient Procedure: 2D Echo, Cardiac Doppler and Color Doppler (Both Spectral and Color            Flow Doppler were utilized during procedure). Indications:     Chest pain  History:        Patient has prior history of Echocardiogram examinations, most                 recent 03/31/2023. CAD, PAD, Arrythmias:Atrial Fibrillation and                 Bradycardia; Risk Factors:Sleep Apnea, Dyslipidemia, Diabetes                 and Hypertension. ESRD, carotid disease.  Sonographer:    Vern Claude Referring Phys: [id number] ANGELA NICOLE DUKE IMPRESSIONS  1. Left ventricular ejection fraction, by estimation, is 60 to 65%. The left ventricle has normal function. The left ventricle has no regional wall  motion abnormalities. There is mild concentric left ventricular hypertrophy. Left ventricular diastolic parameters are consistent with Grade II diastolic dysfunction (pseudonormalization). Elevated left ventricular end-diastolic pressure.  2. Right ventricular systolic function is normal. The right ventricular size is mildly enlarged. Tricuspid regurgitation signal is inadequate for assessing PA pressure.  3. Left atrial size was mildly dilated.  4. The mitral valve is normal in structure. No evidence of mitral valve regurgitation. No evidence of mitral stenosis.  5. The aortic valve is calcified. There is mild calcification of the aortic valve. There is moderate thickening of the aortic valve. Aortic valve regurgitation is not visualized. Mild aortic valve stenosis. Aortic valve area, by VTI measures 1.40 cm. Aortic valve mean gradient measures 12.0 mmHg. Aortic valve Vmax measures 2.37 m/s.  6. The inferior vena cava is normal in size with greater than 50% respiratory variability, suggesting right atrial pressure of 3 mmHg. FINDINGS  Left Ventricle: Left ventricular ejection fraction, by estimation, is 60 to 65%. The left ventricle has normal function. The left ventricle has no regional wall motion abnormalities. The left ventricular internal cavity size was normal in size. There is  mild concentric left ventricular hypertrophy. Left ventricular diastolic parameters are consistent  with Grade II diastolic dysfunction (pseudonormalization). Elevated left ventricular end-diastolic pressure. Right Ventricle: The right ventricular size is mildly enlarged. No increase in right ventricular wall thickness. Right ventricular systolic function is normal. Tricuspid regurgitation signal is inadequate for assessing PA pressure. Left Atrium: Left atrial size was mildly dilated. Right Atrium: Right atrial size was normal in size. Pericardium: There is no evidence of pericardial effusion. Mitral Valve: The mitral valve is normal in structure. Mild to moderate mitral annular calcification. No evidence of mitral valve regurgitation. No evidence of mitral valve stenosis. MV peak gradient, 10.4 mmHg. The mean mitral valve gradient is 3.5 mmHg. Tricuspid Valve: The tricuspid valve is normal in structure. Tricuspid valve regurgitation is not demonstrated. No evidence of tricuspid stenosis. Aortic Valve: The aortic valve is calcified. There is mild calcification of the aortic valve. There is moderate thickening of the aortic valve. Aortic valve regurgitation is not visualized. Mild aortic stenosis is present. Aortic valve mean gradient measures 12.0 mmHg. Aortic valve peak gradient measures 22.4 mmHg. Aortic valve area, by VTI measures 1.40 cm. Pulmonic Valve: The pulmonic valve was normal in structure. Pulmonic valve regurgitation is trivial. No evidence of pulmonic stenosis. Aorta: The aortic root is normal in size and structure. Venous: The inferior vena cava is normal in size with greater than 50% respiratory variability, suggesting right atrial pressure of 3 mmHg. IAS/Shunts: No atrial level shunt detected by color flow Doppler.  LEFT VENTRICLE PLAX 2D LVIDd:         5.10 cm      Diastology LVIDs:         3.30 cm      LV e' medial:    6.64 cm/s LV PW:         1.20 cm      LV E/e' medial:  20.2 LV IVS:        1.30 cm      LV e' lateral:   8.49 cm/s LVOT diam:     2.30 cm      LV E/e' lateral: 15.8 LV SV:          82 LV SV Index:   34 LVOT Area:     4.15 cm  LV Volumes (MOD) LV vol d, MOD A2C: 252.0 ml LV vol  d, MOD A4C: 323.0 ml LV vol s, MOD A2C: 94.6 ml LV vol s, MOD A4C: 103.0 ml LV SV MOD A2C:     157.4 ml LV SV MOD A4C:     323.0 ml LV SV MOD BP:      187.8 ml RIGHT VENTRICLE            IVC RV Basal diam:  5.20 cm    IVC diam: 2.10 cm RV Mid diam:    3.80 cm RV S prime:     9.36 cm/s TAPSE (M-mode): 2.1 cm LEFT ATRIUM             Index        RIGHT ATRIUM           Index LA diam:        4.80 cm 1.98 cm/m   RA Area:     20.10 cm LA Vol (A2C):   83.9 ml 34.61 ml/m  RA Volume:   61.20 ml  25.24 ml/m LA Vol (A4C):   82.6 ml 34.07 ml/m LA Biplane Vol: 84.9 ml 35.02 ml/m  AORTIC VALVE                     PULMONIC VALVE AV Area (Vmax):    1.48 cm      PV Vmax:       1.08 m/s AV Area (Vmean):   1.40 cm      PV Peak grad:  4.6 mmHg AV Area (VTI):     1.40 cm AV Vmax:           236.50 cm/s AV Vmean:          162.500 cm/s AV VTI:            0.589 m AV Peak Grad:      22.4 mmHg AV Mean Grad:      12.0 mmHg LVOT Vmax:         84.20 cm/s LVOT Vmean:        54.800 cm/s LVOT VTI:          0.198 m LVOT/AV VTI ratio: 0.34  AORTA Ao Root diam: 3.40 cm Ao Asc diam:  3.30 cm MITRAL VALVE MV Area (PHT): 2.84 cm     SHUNTS MV Area VTI:   1.74 cm     Systemic VTI:  0.20 m MV Peak grad:  10.4 mmHg    Systemic Diam: 2.30 cm MV Mean grad:  3.5 mmHg MV Vmax:       1.62 m/s MV Vmean:      86.8 cm/s MV Decel Time: 267 msec MV E velocity: 134.00 cm/s MV A velocity: 88.35 cm/s MV E/A ratio:  1.52 Kardie Tobb DO Electronically signed by Thomasene Ripple DO Signature Date/Time: 11/24/2023/5:04:26 PM    Final    Cardiac Studies   TTE 03/31/2023 1. Left ventricular ejection fraction, by estimation, is 60 to 65%. The  left ventricle has normal function. The left ventricle has no regional  wall motion abnormalities. There is mild concentric left ventricular  hypertrophy. Left ventricular diastolic  parameters are consistent with Grade I  diastolic dysfunction (impaired  relaxation). Elevated left atrial pressure.   2. Right ventricular systolic function is normal. The right ventricular  size is normal.   3. Left atrial size was moderately dilated.   4. The mitral valve is normal in structure. No evidence of mitral valve  regurgitation. No evidence of mitral stenosis. Moderate mitral annular  calcification.   5. The aortic valve is tricuspid. Aortic valve regurgitation is not  visualized. Aortic valve sclerosis/calcification is present, without any  evidence of aortic stenosis.   6. The inferior vena cava is dilated in size with >50% respiratory  variability, suggesting right atrial pressure of 8 mmHg.   Cath 11/25/23  Intervention     Assessment & Plan   Mr. Weatherford was recently admitted a few days ago.  He was coming in with hypotension and bradycardia to the 40s.  He had missed a dialysis session last Thursday and felt very short of breath prior to his planned dialysis on Saturday.  He underwent 45 minutes of dialysis and then the session was stopped due to hypotension. At that time he was on amiodarone and labetalol.  His labetalol was subsequently stopped.  His amiodarone was continued.  His antihypertensives were adjusted as well.  He was planned to stop hydralazine, labetalol and amlodipine at least for the short-term.  He then returned to the emergency room on 3/15 with sinus bradycardia in the 40s still.  He also was reporting persistent chest pressure  1. CAD/NSTEMI - Hx of RCA PCI 2016 - peak hstrop 442 - Cath 11/24/23 with severe 2v CAD -> PCI DES (3.0x16 Synergy) prox LCX and partially successful PTCA/Shockwave lithotripsy to pRCA down to 50%. Unable to cross distal RCA lesion  LVEDP 35 - Continue statin - Continue DAPT. Resume Eliquis today. Drop ASA in 30 days  - Start metoprolol XL 25 mg daily. Bradycardia seems to be stable, primarily occurring during the night. Consider OP sleep study.  - Can go to floor  vs stable for discharge post iHD if primary ok with that.   2. PAF - Remains in sinus - Restart Eliquis  3. ESRD - volume status elevated on cath  - will need HD  4. PAD with B BKA - stable    Cardiac meds at discharge:  Eliquis 5 mg BID Amlodipine 5 mg daily (can start tomorrow) ASA 81 mg daily (stop after 30 days) Plavix 75 mg daily Metoprolol XL 25 mg daily Midodrine 10 mg pre-HD "PRN"  Will arrange close f/u with [facility].  Continue ASA/Plavix/Eliquis. After 30 days can drop ASA and continue Plavix/Eliquis.   Arvilla Meres, MD  5:39 PM

## 2023-11-25 NOTE — Progress Notes (Signed)
  [facility] Progress Note   Subjective:   Pt seen in room, states he is feeling "rough." Reports ongoing fatigue and tightness in his chest. He was started on heparin drip overnight. Reports dizziness and shortness of breath with exertion. Denies abdominal pain, N/V/D. Did not have HD last night due to high census.   Objective Vitals:   11/25/23 1100 11/25/23 1142 11/25/23 1200 11/25/23 1308  BP: (!) 128/48  (!) 152/71 114/69  Pulse: (!) 47  70 67  Resp: 13  (!) 22 20  Temp:  98.8 F (37.1 C)    TempSrc:  Oral    SpO2: 93%  95% 95%  Weight:      Height:       Physical Exam General: Alert male in NAD Heart: Bradycardic, regular rhythm, no murmur  Lungs: CTA bilaterally, respirations unlabored on O2 [address] [place] Abdomen: Soft, non-distended, +BS Extremities: Trace stump edema bilaterally Dialysis Access: AVF + t/b  Dialysis Orders: [facility] TTS  4h   B450   107kg   2K bath   AVF   Heparin none - not reaching EDW. Post HD wt 3/8 - 117kg -Mircera 150 q 2 wks (last dose 11/21/23) -Hectorol 2 q HD    CXR 3/15 - no gross edema, bibasilar infiltrates vs atx  Assessment/Plan:  Sinus bradycardia; Recent admission for the same. Beta blocker was discontinued. Cardiology is on board. On midodrine pre-HD, holding until bradycardia improves. LHC on 3/16 yesterday showed severe 2V CAD w/ 99% prox LCx, LVEDP 35. Underwent PCI to RCA, partially successful.   ESRD: is on HD TTS. HD off schedule today since he missed HD this weekend.    Hypertension/volume: BP soft/stable. He does have stump edema which he reports is chronic. CXR on admission without pulmonary edema. Max UF w/ HD today.   Anemia: Hgb 9.8, received mircera on 3/13.   Metabolic bone disease: Continue VDRA and binders. Corrected Calcium at goal, phos 6.5.   Harold Moselle  MD  [facility] 11/25/2023, 1:19 PM  Recent Labs  Lab 11/23/23 1138 11/23/23 1153 11/24/23 0836 11/24/23 2047 11/25/23 0302  HGB 9.3*   < > 9.8* 10.8*  10.8*  ALBUMIN 2.9*  --  2.9*  --   --   CALCIUM 7.4*  --  7.9*  --  7.9*  PHOS  --   --  6.5*  --   --   CREATININE 5.08*   < > 6.49* 7.05* 7.31*  K 4.1   < > 4.2  --  5.6*   < > = values in this interval not displayed.    Inpatient medications:  apixaban  5 mg Oral BID   aspirin EC  81 mg Oral Daily   atorvastatin  80 mg Oral QHS   Chlorhexidine Gluconate Cloth  6 each Topical Q0600   Chlorhexidine Gluconate Cloth  6 each Topical Daily   clopidogrel  75 mg Oral Q breakfast   famotidine  20 mg Oral QODAY   fentaNYL  1 patch Transdermal Q72H   gabapentin  100 mg Oral TID   insulin aspart  0-20 Units Subcutaneous TID WC   metoprolol succinate  25 mg Oral Daily   mupirocin ointment  1 Application Nasal BID   sodium chloride flush  3 mL Intravenous Q12H    sodium chloride     sodium chloride, acetaminophen, albuterol, mouth rinse, oxyCODONE-acetaminophen, sodium chloride flush

## 2023-11-25 NOTE — TOC Initial Note (Signed)
 Transition of Care [facility]) - Initial/Assessment Note    Patient Details  Name: [name] MRN: [id number] Date of Birth: 1955-12-11  Transition of Care [facility]) CM/SW Contact:    Elliot Cousin, RN Phone Number:  [phone] 11/25/2023, 3:24 PM  Clinical Narrative:                  TOC CM spoke to pt and states he is from [facility] and plan is dc back.  Updated TOC CSW for follow up for dc back to SNF.    Expected Discharge Plan: Skilled Nursing Facility Barriers to Discharge: Continued Medical Work up   Patient Goals and CMS Choice Patient states their goals for this hospitalization and ongoing recovery are:: wants to complete SNF rehab CMS Medicare.gov Compare Post Acute Care list provided to:: Patient Choice offered to / list presented to : Patient Alicia ownership interest in [facility].provided to:: Patient    Expected Discharge Plan and Services   Discharge Planning Services: CM Consult Post Acute Care Choice: Skilled Nursing Facility Living arrangements for the past 2 months: Skilled Nursing Facility Expected Discharge Date: 11/25/23                                    Prior Living Arrangements/Services Living arrangements for the past 2 months: Skilled Nursing Facility Lives with:: Facility Resident Patient language and need for interpreter reviewed:: Yes Do you feel safe going back to the place where you live?: Yes      Need for Family Participation in Patient Care: Yes (Comment) Care giver support system in place?: Yes (comment)   Criminal Activity/Legal Involvement Pertinent to Current Situation/Hospitalization: No - Comment as needed  Activities of Daily Living   ADL Screening (condition at time of admission) Independently performs ADLs?: No Does the patient have a NEW difficulty with bathing/dressing/toileting/self-feeding that is expected to last >3 days?: No Does the patient have a NEW difficulty with getting in/out of  bed, walking, or climbing stairs that is expected to last >3 days?: No Does the patient have a NEW difficulty with communication that is expected to last >3 days?: No Is the patient deaf or have difficulty hearing?: No Does the patient have difficulty seeing, even when wearing glasses/contacts?: No Does the patient have difficulty concentrating, remembering, or making decisions?: No  Permission Sought/Granted Permission sought to share information with : PCP, Family Supports Permission granted to share information with : Yes, Verbal Permission Granted  Share Information with NAME: Jacqueline Delapena  Permission granted to share info w AGENCY: SNF  Permission granted to share info w Relationship: wife  Permission granted to share info w Contact Information: [phone]  Emotional Assessment Appearance:: Appears stated age Attitude/Demeanor/Rapport: Engaged Affect (typically observed): Appropriate Orientation: : Oriented to Self, Oriented to Place, Oriented to  Time, Oriented to Situation   Psych Involvement: No (comment)  Admission diagnosis:  Bradycardia [R00.1] Patient Active Problem List   Diagnosis Date Noted   NSTEMI (non-ST elevated myocardial infarction) (HCC) 11/25/2023   Hypervolemia 11/20/2023   Rash and nonspecific skin eruption 11/19/2023   C. 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) 03/31/2023   PAF (paroxysmal atrial fibrillation) (HCC) 03/31/2023   Hypertensive crisis 03/31/2023   Unstable angina (HCC) 03/30/2023    Tobacco abuse 03/30/2023   Prolonged QT interval 03/30/2023   Elevated troponin 03/30/2023   Cellulitis 11/20/2022   S/P BKA (below knee amputation) unilateral, left (HCC) 07/04/2022   Subacute osteomyelitis, left ankle and foot (HCC) 07/03/2022   Malaise 01/13/2022   Anemia of renal disease 01/13/2022   Obesity (BMI 30-39.9) 01/13/2022   Decubitus ulcer of buttock 01/13/2022   Intractable nausea and vomiting with epigastric pain 01/11/2022   Chronic osteomyelitis (HCC) 01/11/2022   Proliferative diabetic retinopathy of left eye with macular edema associated with type 2 diabetes mellitus (HCC) 11/27/2021   Vitreous hemorrhage of right eye (HCC) 11/27/2021   Sepsis (HCC) 09/02/2021   Severe sepsis with lactic acidosis (HCC) 06/14/2021   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) 06/14/2021   ESRD on hemodialysis with hyperkalemia 06/14/2021   Paroxysmal atrial fibrillation with RVR (HCC) 06/14/2021   Carotid stenosis 06/15/2020   Dyslipidemia 06/15/2020   Diabetic macular edema of right eye with proliferative retinopathy associated with type 2 diabetes mellitus (HCC) 01/18/2020   Severe nonproliferative diabetic retinopathy of left eye, with macular edema, associated with type 2 diabetes mellitus (HCC) 01/04/2020   Retinal hemorrhage of right eye 01/04/2020   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   Pseudoclaudication 11/15/2018   S/P BKA (below knee amputation), right (HCC) 09/18/2018   Amputation of toe of left foot (HCC) 09/18/2018   Peripheral artery disease (HCC) 09/18/2018   S/P carotid endarterectomy 09/18/2018   OSA not tolerating CPAP 09/18/2018   Chronic pain 09/18/2018   Status post reversal of ileostomy 05/21/2018   Normocytic anemia 02/14/2018   Intra-abdominal abscess (HCC) 11/04/2017   Chronic venous hypertension (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 (HCC) 12/11/2016   PAD (peripheral artery disease) (HCC) 11/12/2016   Unilateral  primary osteoarthritis, left knee 10/11/2016   History of right below knee amputation (HCC) 07/12/2016   Type 2 diabetes mellitus with diabetic neuropathy (HCC) 05/09/2016   Infected diabetic left foot ulcer with chronic osteomyelitis    Diabetic foot infection (HCC) 05/04/2016   Insulin dependent type 2 diabetes mellitus (HCC) 12/16/2015   Amputated toe (HCC) 10/21/2015   Cellulitis of left lower extremity 09/03/2015   Leg edema, left 09/03/2015   CAD (dz of distal, mid and proximal RCA with implantation of 3 overlapping drug-eluting stent, with elevated troponin 09/03/2015   Chronic diastolic heart failure, NYHA class 2 (HCC) 09/03/2015   Hyperlipidemia 08/25/2014   Limb pain 03/20/2013   DJD (degenerative joint disease) 09/25/2012   Migraine 09/25/2012   Neuropathy 09/25/2012   Restless legs syndrome (RLS) 09/25/2012   Chronic obstructive pulmonary disease, unspecified (HCC) 04/25/2012   Unknown cause of morbidity or mortality 04/25/2012   Chronic total occlusion of artery of the extremities (HCC) 04/08/2012   Onychomycosis 02/01/2012   Occlusion and stenosis of carotid artery without mention of cerebral infarction 06/12/2011   Barrett's esophagus without dysplasia 05/08/2011   Former tobacco use 02/01/2011   PCP:  Lonie Peak, PA-C Pharmacy:   [facility] [phone] [place], [place] - [address] AT [address] [place] [address] Phone: [phone] Fax: [phone]  [id number] - [place],  - [address] [place]  [place] [address] Phone: [phone] Fax: [phone]  [facility] - [place], [place] - [address] [place] [address] Phone: [phone] Fax: [phone]     Social Drivers of Health (SDOH) Social History: SDOH Screenings   Food Insecurity: No Food Insecurity (11/23/2023)  Housing: Low Risk  (11/23/2023)  Transportation Needs: No Transportation Needs  (11/23/2023)  Utilities: Not At Risk (11/23/2023)  Social Connections: Moderately Isolated (11/23/2023)  Tobacco Use: High Risk (11/23/2023)   SDOH Interventions:     Readmission Risk Interventions    05/29/2023    4:41 PM 06/30/2021   11:53 AM  Readmission Risk Prevention Plan  Transportation Screening Complete Complete  Medication Review Oceanographer) Complete Complete  PCP or Specialist appointment within 3-5 days of discharge Complete   HRI or Home Care Consult Complete Complete  SW Recovery Care/Counseling Consult Complete Complete  Palliative Care Screening Not Applicable Not Applicable  Skilled Nursing Facility Complete Not Applicable

## 2023-11-25 NOTE — Discharge Instructions (Addendum)
 To Mr. JULLIAN CLAYSON or their caretakers,  They were admitted to [facility] on 11/23/2023 for evaluation and treatment of:  Principal Problem:   NSTEMI (non-ST elevated myocardial infarction) [facility]) Active Problems:   Type 2 diabetes mellitus with diabetic neuropathy (HCC)   S/P BKA (below knee amputation), right (HCC)   Chronic pain   PAF (paroxysmal atrial fibrillation) (HCC)  Resolved Problems:   Hypertension   Bradycardia  The evaluation suggested Chest pain . They were treated for NSTEMI with a Cath.  They were discharged from the hospital on 11/25/23. I recommend the following after leaving the hospital: Remember take your aspirin and plavix as discussed. Be sure to remind your facility nurse if you think you are not getting it.   For questions about your care plan, until you are able to see your primary doctor: Call [phone]. Dial 0 for the operator. Ask for the internal medicine resident on call.  Kathleen Lime. MD 11/25/2023, 11:23 AM

## 2023-11-25 NOTE — Progress Notes (Signed)
 CARDIAC REHAB PHASE I      Post MI/stent education including restrictions, risk factors, exercise guidelines, antiplatelet therapy importance, MI booklet, NTG use, heart healthy diabetic diet, smoking cessation and CRP2 reviewed. All questions and concerns addressed. Will refer to [facility] for CRP2 per protocol. Pt has multiple health issues that will impede CRP2 participation.   0900-0930 Woodroe Chen, RN BSN 11/25/2023 9:41 AM

## 2023-11-26 DIAGNOSIS — Z91158 Patient's noncompliance with renal dialysis for other reason: Secondary | ICD-10-CM | POA: Diagnosis not present

## 2023-11-26 DIAGNOSIS — Z992 Dependence on renal dialysis: Secondary | ICD-10-CM | POA: Diagnosis not present

## 2023-11-26 DIAGNOSIS — N186 End stage renal disease: Secondary | ICD-10-CM | POA: Diagnosis not present

## 2023-11-26 DIAGNOSIS — I214 Non-ST elevation (NSTEMI) myocardial infarction: Secondary | ICD-10-CM | POA: Diagnosis not present

## 2023-11-26 NOTE — Discharge Planning (Addendum)
 [facility] Patient Discharge Orders- [facility] CLINIC: [facility]  Patient's name: ANTUANE EASTRIDGE Admit/DC Dates: 11/23/2023 - 11/25/2023  Discharge Diagnoses: Bradycardia - Labetalol stopped, now on Metoprolol. Appears Midodrine was held but now resumed. CAD -  s/p LHC 3/16 showed severe 2V CAD w/ 99% prox LCx, LVEDP 35. Underwent PCI to RCA, partially successful. On triple therapy  Aranesp: Given: No    Last Hgb: 10.8 PRBC's Given: No  ESA dose for discharge: Resume mircera 150 mcg IV q 2 weeks  IV Iron dose at discharge: Resume weekly Venofer  Heparin change: No  EDW Change: No. Noted 2 shortened txs in outpatient  Bath Change: No  Access intervention/Change: No  Hectorol change: No  Discharge Labs: Calcium 7.9 K+ 5.6  IV Antibiotics: No  On Coumadin?: No. On triple therapy-see dc summary   OTHER/APPTS/LAB ORDERS: Re-check K+ level on HD 11/27/23    D/C Meds to be reconciled by nurse after every discharge.  Completed By: Salome Holmes, NP   Reviewed by: MD:______ RN_______

## 2023-11-27 DIAGNOSIS — E1140 Type 2 diabetes mellitus with diabetic neuropathy, unspecified: Secondary | ICD-10-CM | POA: Diagnosis not present

## 2023-11-27 DIAGNOSIS — Z5181 Encounter for therapeutic drug level monitoring: Secondary | ICD-10-CM | POA: Diagnosis not present

## 2023-11-27 DIAGNOSIS — R52 Pain, unspecified: Secondary | ICD-10-CM | POA: Diagnosis not present

## 2023-11-27 DIAGNOSIS — I214 Non-ST elevation (NSTEMI) myocardial infarction: Secondary | ICD-10-CM | POA: Diagnosis not present

## 2023-12-02 ENCOUNTER — Ambulatory Visit: Attending: Physician Assistant | Admitting: Physician Assistant

## 2023-12-02 ENCOUNTER — Encounter: Payer: Self-pay | Admitting: Physician Assistant

## 2023-12-02 VITALS — BP 138/72 | HR 76 | Ht 74.0 in | Wt 255.0 lb

## 2023-12-02 DIAGNOSIS — N186 End stage renal disease: Secondary | ICD-10-CM

## 2023-12-02 DIAGNOSIS — I739 Peripheral vascular disease, unspecified: Secondary | ICD-10-CM

## 2023-12-02 DIAGNOSIS — I5032 Chronic diastolic (congestive) heart failure: Secondary | ICD-10-CM

## 2023-12-02 DIAGNOSIS — R001 Bradycardia, unspecified: Secondary | ICD-10-CM

## 2023-12-02 DIAGNOSIS — I10 Essential (primary) hypertension: Secondary | ICD-10-CM

## 2023-12-02 DIAGNOSIS — I480 Paroxysmal atrial fibrillation: Secondary | ICD-10-CM | POA: Diagnosis not present

## 2023-12-02 DIAGNOSIS — Z720 Tobacco use: Secondary | ICD-10-CM

## 2023-12-02 DIAGNOSIS — I214 Non-ST elevation (NSTEMI) myocardial infarction: Secondary | ICD-10-CM

## 2023-12-02 DIAGNOSIS — I350 Nonrheumatic aortic (valve) stenosis: Secondary | ICD-10-CM | POA: Diagnosis not present

## 2023-12-02 DIAGNOSIS — Z992 Dependence on renal dialysis: Secondary | ICD-10-CM | POA: Diagnosis not present

## 2023-12-02 NOTE — Assessment & Plan Note (Signed)
 Long history of smoking since age 68. Quit smoking and switched to vaping but resumed smoking due to restrictions at the nursing home. Discussed benefits of discontinuing smoking, including reduced risk of myocardial infarction, vascular disease, stroke, and cancer. Quitting smoking after a myocardial infarction significantly reduces the risk of another myocardial infarction.  He is not ready to quit.

## 2023-12-02 NOTE — Assessment & Plan Note (Signed)
 History of extensive PCI with overlapping DES x 3 in 2016 to the RCA.  He had a recent non-ST elevation myocardial infarction (NSTEMI) which was treated with a drug-eluting stent to the proximal LCX and partially successful PCI with shockwave lithotripsy and high-pressure non-compliant balloon to mid RCA, resulting in 50% residual stenosis.  He is doing well without recurrent chest discomfort to suggest angina.  He remains on triple therapy with aspirin, Plavix, and Eliquis. Aspirin is to be discontinued after 30 days.  He will continue on Eliquis 5 mg twice daily and Plavix 75 mg daily. Lipitor 80 mg daily is also continued. - Continue aspirin until April 16th, then discontinue. - Continue Eliquis 5 mg twice daily. - Continue Plavix 75 mg daily. - Continue Lipitor 80 mg daily.

## 2023-12-02 NOTE — Progress Notes (Signed)
 Cardiology Office Note:    Date:  12/02/2023  ID:  BRENT TAILLON, DOB [birth year], MRN [id number] PCP: Lonie Peak, PA-C  [facility] Providers Cardiologist:  Yates Decamp, MD       Patient Profile:      Coronary artery disease  S/p extensive PCI to RCA in 2016 w 3 overlapping DES  NSTEMI in 11/2023 s/p 3 x 16 mm DES to pLCx, partially successful PCI (Shockwave lithotripsy, high pressure [place] balloon) to mRCA reduced to 50% stenosis  LHC 11/24/2023: D1 ostial 50; ostial LCx 99 thrombotic; ostial RCA 90 ISR, mid to distal RCA 80 ISR (HFpEF) heart failure with preserved ejection fraction  TTE 11/24/2023: EF 60-65, no RWMA, mild LVH, GRII DD, normal RVSF, mild LAE, mild aortic stenosis (mean 12, V-max 237 cm/s, DI 0.34) Paroxysmal atrial fibrillation Amiodarone Rx  ESRD on hemodialysis TThSa Peripheral arterial disease S/p R BKA S/p toe amputations left foot S/p balloon angioplasty to L ATA in 06/2021 (Dr. Rosemary Holms) S/p L BKA 06/2022 Carotid artery disease S/p R CEA in 2012 Carotid US 08/1020: R ICA 1-39 Diabetes mellitus OSA          Discussed the use of AI scribe software for clinical note transcription with the patient, who gave verbal consent to proceed.  History of Present Illness [name] is a 68 y.o. male returns for post hospital follow up. He was admitted 3/15-3/17. He initially presented (3/11-3/12) with hypotension and bradycardia with heart rates in the 40s.  Labetalol was stopped and he was continued on amiodarone.  He presented back to the hospital with recurrent bradycardia and chest pressure.  He ruled in for non-STEMI and underwent cardiac catheterization demonstrating severe two-vessel CAD with significant in-stent restenosis in the RCA and severe stenosis in the LCx.  LCx was treated with DES.  He underwent PCI which was partially successful shockwave lithotripsy and balloon angioplasty reducing stenosis to 50%.  Distal RCA lesion could not be crossed.   DAPT plus Eliquis was recommended for 30 days.  Then, aspirin can be stopped.  Bradycardia seen to be occurring mainly at night and recommendation was to consider outpatient sleep study.  He was started back on Toprol-XL 25 mg daily for beta-blocker therapy.  He is here alone.  He resides at [facility].  He arrives in a motorized scooter.  Since discharge, he has not had chest symptoms such as pressure or heaviness or significant shortness of breath. He can lay flat on his back to sleep but prefers a slight angle. He goes to dialysis on Tuesday, Thursday, and Saturday, with no issues of syncope or difficulty reaching his dry weight. His systolic blood pressure has been noted to reach 170 mmHg at times. Amlodipine, Imdur and hydralazine were held during his hospital stay.  He is working with physical therapy to get fitted for prosthetic limbs. He has a history of sleep apnea but does not currently use a CPAP machine, as he did not tolerate it well in the past. No snoring is reported, and he has not pursued further sleep studies. He was noted to have episodes of bradycardia during sleep in the hospital. He has been smoking since age 32 and had switched to vaping before returning to smoking due to restrictions at the nursing home. He wants to return to vaping once he leaves the facility.   Review of Systems  Gastrointestinal:  Negative for hematochezia and melena.  Genitourinary:  Negative for hematuria.  -See HPI  Studies Reviewed:   EKG Interpretation Date/Time:  Monday December 02 2023 14:47:01 EDT Ventricular Rate:  77 PR Interval:  268 QRS Duration:  166 QT Interval:  486 QTC Calculation: 549 R Axis:   -70  Text Interpretation: Sinus rhythm with 1st degree A-V block Right bundle branch block Left anterior fascicular block Bifascicular block Prolonged QT No significant change since last tracing Confirmed by Tereso Newcomer [phone]) on 12/02/2023 3:03:12 PM   Results LABS TSH: 0.771  (05/2023) ALT: 11 (11/23/2023)    Risk Assessment/Calculations:    CHA2DS2-VASc Score = 4   This indicates a 4.8% annual risk of stroke. The patient's score is based upon: CHF History: 0 HTN History: 1 Diabetes History: 1 Stroke History: 0 Vascular Disease History: 1 Age Score: 1 Gender Score: 0            Physical Exam:   VS:  BP 138/72   Pulse 76   Ht 6\' 2"  (1.88 m)   Wt 255 lb (115.7 kg)   SpO2 98%   BMI 32.74 kg/m    Wt Readings from Last 3 Encounters:  12/02/23 255 lb (115.7 kg)  11/25/23 250 lb 10.6 oz (113.7 kg)  09/26/23 227 lb 1.2 oz (103 kg)    Constitutional:      Appearance: Healthy appearance. Not in distress.  Neck:     Vascular: No JVR.  Pulmonary:     Breath sounds: No wheezing. Bibasilar Rales present.  Cardiovascular:     Normal rate. Regular rhythm.     Murmurs: There is a grade 2/6 systolic murmur at the URSB.  Edema:    Peripheral edema absent.  Abdominal:     Palpations: Abdomen is soft.  Musculoskeletal:     Right Lower Extremity: Right leg is amputated below knee.     Left Lower Extremity: Left leg is amputated below knee.       Assessment and Plan:   Assessment & Plan NSTEMI (non-ST elevated myocardial infarction) (HCC) History of extensive PCI with overlapping DES x 3 in 2016 to the RCA.  He had a recent non-ST elevation myocardial infarction (NSTEMI) which was treated with a drug-eluting stent to the proximal LCX and partially successful PCI with shockwave lithotripsy and high-pressure non-compliant balloon to mid RCA, resulting in 50% residual stenosis.  He is doing well without recurrent chest discomfort to suggest angina.  He remains on triple therapy with aspirin, Plavix, and Eliquis. Aspirin is to be discontinued after 30 days.  He will continue on Eliquis 5 mg twice daily and Plavix 75 mg daily. Lipitor 80 mg daily is also continued. - Continue aspirin until April 16th, then discontinue. - Continue Eliquis 5 mg twice daily. -  Continue Plavix 75 mg daily. - Continue Lipitor 80 mg daily. PAF (paroxysmal atrial fibrillation) (HCC) Maintaining sinus rhythm. On amiodarone 200 mg daily, Eliquis 5 mg twice daily, and metoprolol succinate 25 mg daily. TSH was normal in September 2024, and ALT was normal in March 2025. - Continue amiodarone 200 mg daily. - Continue Eliquis 5 mg twice daily. - Continue metoprolol succinate 25 mg daily. Chronic heart failure with preserved ejection fraction (HCC) Echocardiogram from November 24, 2023, shows an ejection fraction of 60-65% with moderate diastolic dysfunction. Volume is managed by dialysis. GDMT limited by hypotension and ESRD. ESRD on hemodialysis with hyperkalemia On hemodialysis on Tuesday, Thursday, and Saturday. Bradycardia Bradycardia during sleep in the hospital. History of sleep apnea but unable to tolerate CPAP therapy 10-15 years ago. Recommendation  was to proceed with outpatient sleep testing, but he is not interested in pursuing this.  We discussed treating sleep apnea could reduce risk of worsening heart failure and atrial fibrillation. Nonrheumatic aortic valve stenosis Mild by recent echocardiogram.  Consider repeat echocardiogram in 2 to 3 years. Primary hypertension Blood pressure is well-controlled, but reports some readings of 170.  He was taken off amlodipine, isosorbide and hydralazine in the hospital.  Continue to monitor blood pressure, and if several readings are above 150/90, consider resuming low-dose amlodipine or low-dose isosorbide.  We discussed the risks of uncontrolled blood pressure. - Consider resuming low-dose amlodipine or low-dose isosorbide if blood pressure readings are consistently above 150/90. PAD (peripheral artery disease) (HCC) S/p bilateral BKAs. Currently residing at a skilled nursing facility and working with physical therapy to get fitted for prosthetic limbs. Tobacco abuse Long history of smoking since age 79. Quit smoking and  switched to vaping but resumed smoking due to restrictions at the nursing home. Discussed benefits of discontinuing smoking, including reduced risk of myocardial infarction, vascular disease, stroke, and cancer. Quitting smoking after a myocardial infarction significantly reduces the risk of another myocardial infarction.  He is not ready to quit.    Cardiac Rehabilitation Eligibility Assessment  The patient is NOT ready to start cardiac rehabilitation due to: The patient is non-ambulatory or cannot walk a total of 30 minutes in 1 day.     Dispo:  Return in 30 days (on 01/01/2024) for Scheduled Follow Up, w/ Dr. Jacinto Halim.  Signed, Tereso Newcomer, PA-C

## 2023-12-02 NOTE — Assessment & Plan Note (Signed)
 On hemodialysis on Tuesday, Thursday, and Saturday.

## 2023-12-02 NOTE — Assessment & Plan Note (Signed)
 Echocardiogram from November 24, 2023, shows an ejection fraction of 60-65% with moderate diastolic dysfunction. Volume is managed by dialysis. GDMT limited by hypotension and ESRD.

## 2023-12-02 NOTE — Assessment & Plan Note (Signed)
>>  ASSESSMENT AND PLAN FOR PAD (PERIPHERAL ARTERY DISEASE) (HCC) WRITTEN ON 12/02/2023  5:32 PM BY WEAVER, SCOTT T, PA-C  S/p bilateral BKAs. Currently residing at a skilled nursing facility and working with physical therapy to get fitted for prosthetic limbs.

## 2023-12-02 NOTE — Assessment & Plan Note (Signed)
 Maintaining sinus rhythm. On amiodarone 200 mg daily, Eliquis 5 mg twice daily, and metoprolol succinate 25 mg daily. TSH was normal in September 2024, and ALT was normal in March 2025. - Continue amiodarone 200 mg daily. - Continue Eliquis 5 mg twice daily. - Continue metoprolol succinate 25 mg daily.

## 2023-12-02 NOTE — Assessment & Plan Note (Signed)
 S/p bilateral BKAs. Currently residing at a skilled nursing facility and working with physical therapy to get fitted for prosthetic limbs.

## 2023-12-02 NOTE — Patient Instructions (Signed)
 Medication Instructions:  Your physician has recommended you make the following change in your medication:   AFTER 12/25/23 STOP ASPIRIN  *If you need a refill on your cardiac medications before your next appointment, please call your pharmacy*   Lab Work: None ordered   If you have labs (blood work) drawn today and your tests are completely normal, you will receive your results only by: MyChart Message (if you have MyChart) OR A paper copy in the mail If you have any lab test that is abnormal or we need to change your treatment, we will call you to review the results.   Testing/Procedures: None ordered   Follow-Up: At [facility], you and your health needs are our priority.  As part of our continuing mission to provide you with exceptional heart care, we have created designated Provider Care Teams.  These Care Teams include your primary Cardiologist (physician) and Advanced Practice Providers (APPs -  Physician Assistants and Nurse Practitioners) who all work together to provide you with the care you need, when you need it.  We recommend signing up for the patient portal called "MyChart".  Sign up information is provided on this After Visit Summary.  MyChart is used to connect with patients for Virtual Visits (Telemedicine).  Patients are able to view lab/test results, encounter notes, upcoming appointments, etc.  Non-urgent messages can be sent to your provider as well.   To learn more about what you can do with MyChart, go to [email or web address].    Your next appointment:   As scheduled   Provider:   Yates Decamp, MD     Other Instructions IF Y OUR BLOOD PRESSURE IS GREATER THAN 150/90 3 TIMES IN A ROW, CALL THE OFFICE    1st Floor: - Lobby - Registration  - Pharmacy  - Lab - Cafe  2nd Floor: - PV Lab - Diagnostic Testing (echo, CT, nuclear med)  3rd Floor: - Vacant  4th Floor: - TCTS (cardiothoracic surgery) - AFib Clinic - Structural Heart  Clinic - Vascular Surgery  - Vascular Ultrasound  5th Floor: - HeartCare Cardiology (general and EP) - Clinical Pharmacy for coumadin, hypertension, lipid, weight-loss medications, and med management appointments    Valet parking services will be available as well.

## 2023-12-05 DIAGNOSIS — Z91158 Patient's noncompliance with renal dialysis for other reason: Secondary | ICD-10-CM | POA: Diagnosis not present

## 2023-12-05 DIAGNOSIS — R059 Cough, unspecified: Secondary | ICD-10-CM | POA: Diagnosis not present

## 2023-12-05 DIAGNOSIS — Z992 Dependence on renal dialysis: Secondary | ICD-10-CM | POA: Diagnosis not present

## 2023-12-05 DIAGNOSIS — N186 End stage renal disease: Secondary | ICD-10-CM | POA: Diagnosis not present

## 2023-12-06 DIAGNOSIS — Z79899 Other long term (current) drug therapy: Secondary | ICD-10-CM | POA: Diagnosis not present

## 2023-12-06 DIAGNOSIS — F4321 Adjustment disorder with depressed mood: Secondary | ICD-10-CM | POA: Diagnosis not present

## 2023-12-06 DIAGNOSIS — F03B4 Unspecified dementia, moderate, with anxiety: Secondary | ICD-10-CM | POA: Diagnosis not present

## 2023-12-06 DIAGNOSIS — I10 Essential (primary) hypertension: Secondary | ICD-10-CM | POA: Diagnosis not present

## 2023-12-06 DIAGNOSIS — E785 Hyperlipidemia, unspecified: Secondary | ICD-10-CM | POA: Diagnosis not present

## 2023-12-06 DIAGNOSIS — E559 Vitamin D deficiency, unspecified: Secondary | ICD-10-CM | POA: Diagnosis not present

## 2023-12-09 DIAGNOSIS — E113511 Type 2 diabetes mellitus with proliferative diabetic retinopathy with macular edema, right eye: Secondary | ICD-10-CM | POA: Diagnosis not present

## 2023-12-09 DIAGNOSIS — N186 End stage renal disease: Secondary | ICD-10-CM | POA: Diagnosis not present

## 2023-12-09 DIAGNOSIS — E113512 Type 2 diabetes mellitus with proliferative diabetic retinopathy with macular edema, left eye: Secondary | ICD-10-CM | POA: Diagnosis not present

## 2023-12-09 DIAGNOSIS — Z992 Dependence on renal dialysis: Secondary | ICD-10-CM | POA: Diagnosis not present

## 2023-12-09 DIAGNOSIS — H43813 Vitreous degeneration, bilateral: Secondary | ICD-10-CM | POA: Diagnosis not present

## 2023-12-09 DIAGNOSIS — Z794 Long term (current) use of insulin: Secondary | ICD-10-CM | POA: Diagnosis not present

## 2023-12-09 DIAGNOSIS — E1122 Type 2 diabetes mellitus with diabetic chronic kidney disease: Secondary | ICD-10-CM | POA: Diagnosis not present

## 2023-12-10 DIAGNOSIS — E785 Hyperlipidemia, unspecified: Secondary | ICD-10-CM | POA: Diagnosis not present

## 2023-12-10 DIAGNOSIS — Z91158 Patient's noncompliance with renal dialysis for other reason: Secondary | ICD-10-CM | POA: Diagnosis not present

## 2023-12-10 DIAGNOSIS — Z992 Dependence on renal dialysis: Secondary | ICD-10-CM | POA: Diagnosis not present

## 2023-12-10 DIAGNOSIS — N186 End stage renal disease: Secondary | ICD-10-CM | POA: Diagnosis not present

## 2023-12-23 DIAGNOSIS — E113512 Type 2 diabetes mellitus with proliferative diabetic retinopathy with macular edema, left eye: Secondary | ICD-10-CM | POA: Diagnosis not present

## 2023-12-23 DIAGNOSIS — H02836 Dermatochalasis of left eye, unspecified eyelid: Secondary | ICD-10-CM | POA: Diagnosis not present

## 2023-12-23 DIAGNOSIS — Z794 Long term (current) use of insulin: Secondary | ICD-10-CM | POA: Diagnosis not present

## 2023-12-23 DIAGNOSIS — E113511 Type 2 diabetes mellitus with proliferative diabetic retinopathy with macular edema, right eye: Secondary | ICD-10-CM | POA: Diagnosis not present

## 2023-12-23 DIAGNOSIS — Z515 Encounter for palliative care: Secondary | ICD-10-CM | POA: Diagnosis not present

## 2023-12-23 DIAGNOSIS — H02833 Dermatochalasis of right eye, unspecified eyelid: Secondary | ICD-10-CM | POA: Diagnosis not present

## 2023-12-23 DIAGNOSIS — H43813 Vitreous degeneration, bilateral: Secondary | ICD-10-CM | POA: Diagnosis not present

## 2023-12-30 DIAGNOSIS — E1165 Type 2 diabetes mellitus with hyperglycemia: Secondary | ICD-10-CM | POA: Diagnosis not present

## 2023-12-30 DIAGNOSIS — N186 End stage renal disease: Secondary | ICD-10-CM | POA: Diagnosis not present

## 2023-12-30 DIAGNOSIS — Z992 Dependence on renal dialysis: Secondary | ICD-10-CM | POA: Diagnosis not present

## 2023-12-30 DIAGNOSIS — E785 Hyperlipidemia, unspecified: Secondary | ICD-10-CM | POA: Diagnosis not present

## 2023-12-31 NOTE — Progress Notes (Unsigned)
 Cardiology Office Note:  .   Date:  01/01/2024  ID:  [name], DOB 11/21/1955, MRN [id number] PCP: Aloha Arnold, PA-C  [facility] Providers Cardiologist:  Knox Perl, MD   History of Present Illness: .   [name] is a 68 y.o. Caucasian male patient with coronary artery disease with history of PCI and stenting to RCA in 2016, presenting with NSTEMI on 11/24/2023 underwent cardiac catheterization and had in-stent restenosis to RCA and severe stenosis in LCx, LCx was treated with DES, ISR versus partially successful with inability to expand the stents completely due to severe calcification in the RCA.  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  Discussed the use of AI scribe software for clinical note transcription with the patient, who gave verbal consent to proceed.  History of Present Illness The patient, with a history of heart blockages and high blood pressure, reports feeling well since his last hospital admission.  He reports that his blood pressure can be high at times, and he has not been placed back on any blood pressure medications since his hospital stay. He also takes amiodarone  for AFib, which has been maintaining a regular rhythm. The patient is also on dialysis, and he reports that his blood pressure drops after dialysis sessions. He has been feeling rough after these sessions.  Labs   Lab Results  Component Value Date   CHOL 104 03/31/2023   HDL 23 (L) 03/31/2023   LDLCALC 54 03/31/2023   TRIG 135 03/31/2023   CHOLHDL 4.5 03/31/2023   Lab Results  Component Value Date   NA 129 (L) 11/25/2023   K 5.6 (H) 11/25/2023   CO2 21 (L) 11/25/2023   GLUCOSE 266 (H) 11/25/2023   BUN 47 (H) 11/25/2023   CREATININE 7.31 (H) 11/25/2023    CALCIUM  7.9 (L) 11/25/2023   GFR 85.57 03/11/2012   GFRNONAA 7 (L) 11/25/2023      Latest Ref Rng & Units 11/25/2023    3:02 AM 11/24/2023    8:47 PM 11/24/2023    8:36 AM  CBC  WBC 4.0 - 10.5 K/uL 6.1  8.5  6.0   Hemoglobin 13.0 - 17.0 g/dL 14.7  82.9  9.8   Hematocrit 39.0 - 52.0 % 32.1  33.3  29.6   Platelets 150 - 400 K/uL 143  146  128    Lab Results  Component Value Date   HGBA1C 5.7 (H) 07/19/2023    Lab Results  Component Value Date   TSH 0.771 05/28/2023    Review of Systems  Cardiovascular:  Negative for chest pain, dyspnea on exertion and leg swelling.   Physical Exam:   VS:  BP 130/60 (BP Location: Right Arm, Patient Position: Sitting, Cuff Size: Large)   Pulse 70   Resp 16   Ht 6\' 2"  (1.88 m)   Wt 248 lb (112.5 kg) Comment: Patient reported  SpO2 97%   BMI 31.84 kg/m    Wt Readings from Last 3 Encounters:  01/01/24 248 lb (112.5 kg)  12/02/23 255 lb (115.7 kg)  11/25/23 250 lb 10.6 oz (113.7 kg)    Physical Exam Constitutional:      Appearance: He is obese.  Neck:     Vascular: No carotid bruit or JVD.  Cardiovascular:     Rate and Rhythm: Normal rate  and regular rhythm.     Pulses:           Right popliteal pulse not accessible and left popliteal pulse not accessible.     Heart sounds: Murmur heard.     Harsh midsystolic murmur is present with a grade of 2/6 at the upper right sternal border.     No gallop.  Pulmonary:     Effort: Pulmonary effort is normal.     Breath sounds: Normal breath sounds.  Abdominal:     General: Bowel sounds are normal.     Palpations: Abdomen is soft.  Musculoskeletal:        General: Deformity (Bilateral BKA) present.    Studies Reviewed: Aaron Aas  CAD, hyper   Cardiac Catheterization 11/24/2023: 11/24/2023  In-stent restenosis to RCA and severe stenosis in LCx, LCx was treated with DES, ISR versus partially successful     EKG:         Medications and allergies    Allergies  Allergen Reactions   Contrast  Media [Iodinated Contrast Media] Shortness Of Breath and Other (See Comments)    Difficulty breathing and altered mental status     Ivp Dye [Iodinated Contrast Media] Anaphylaxis, Shortness Of Breath and Other (See Comments)    Breathing problems, altered mental state    Latex Rash and Other (See Comments)    A severe rash appears after the first 24 hours of being placed   Tape Other (See Comments) and Rash    Rash after 1 day of use  Do not leave on longer then 3 days with out be changed     Current Outpatient Medications:    apixaban  (ELIQUIS ) 5 MG TABS tablet, Take 1 tablet (5 mg total) by mouth 2 (two) times daily., Disp: 60 tablet, Rfl: 1   atorvastatin  (LIPITOR) 80 MG tablet, Take 1 tablet (80 mg total) by mouth at bedtime., Disp: , Rfl:    B Complex-C-Zn-Folic Acid  (DIALYVITE /ZINC ) TABS, Take 1 tablet by mouth Every Tuesday,Thursday,and Saturday with dialysis., Disp: , Rfl:    carvedilol  (COREG ) 3.125 MG tablet, Take 1 tablet (3.125 mg total) by mouth 2 (two) times daily with a meal., Disp: , Rfl:    clopidogrel  (PLAVIX ) 75 MG tablet, Take 1 tablet (75 mg total) by mouth daily with breakfast., Disp: , Rfl:    famotidine  (PEPCID ) 20 MG tablet, Take 20 mg by mouth every other day., Disp: , Rfl:    fentaNYL  (DURAGESIC ) 50 MCG/HR, 1 patch every 3 (three) days., Disp: , Rfl:    folic acid  (FOLVITE ) 1 MG tablet, Take 1 mg by mouth in the morning., Disp: , Rfl:    gabapentin  (NEURONTIN ) 100 MG capsule, Take 100 mg by mouth 3 (three) times daily., Disp: , Rfl:    [Paused] hydrALAZINE  (APRESOLINE ) 50 MG tablet, Take 50 mg by mouth every 8 (eight) hours., Disp: , Rfl:    Icosapent  Ethyl 0.5 g CAPS, Take 1 capsule by mouth in the morning., Disp: , Rfl:    insulin  aspart (NOVOLOG  FLEXPEN) 100 UNIT/ML FlexPen, Inject 0-12 Units into the skin See admin instructions. Inject 4 units into the skin with meals in addition to sliding scale as needed. Inject as per sliding scale: If 0 - 69 = 0 initiate  hypoglycemia orders;  70 - 150 = 0;  151 - 200 = 2;  201 - 250 = 4;  251 - 300 = 6;  301 - 350 = 8;  351 - 400 = 10;  401 -  450 = 12.  If >400, give 12 units, recheck in 1 hour. If still >400, call MD/NP for further orders. Subcutaneously with meals in addition to scheduled 4 unites with meals., Disp: , Rfl:    lidocaine -prilocaine  (EMLA ) cream, Apply 1 application topically See admin instructions. Prior to Dialysis days Tuesday,Thursday and saturday, Disp: , Rfl:    liver oil-zinc  oxide (DESITIN) 40 % ointment, Apply 1 Application topically as needed for irritation., Disp: , Rfl:    loratadine  (CLARITIN ) 10 MG tablet, Take 10 mg by mouth in the morning., Disp: , Rfl:    midodrine  (PROAMATINE ) 10 MG tablet, Take 10 mg by mouth daily as needed (SBP less than 100.)., Disp: , Rfl:    mineral oil-hydrophilic petrolatum (AQUAPHOR) ointment, Apply 1 Application topically as needed for dry skin or irritation (Upper thigh)., Disp: , Rfl:    nitroGLYCERIN  (NITROSTAT ) 0.4 MG SL tablet, Place 0.4 mg under the tongue every 5 (five) minutes as needed for chest pain., Disp: , Rfl:    oxyCODONE -acetaminophen  (PERCOCET /ROXICET) 5-325 MG tablet, Take 1 tablet by mouth every 8 (eight) hours as needed for severe pain., Disp: 15 tablet, Rfl: 0   promethazine  (PHENERGAN ) 12.5 MG suppository, Place 12.5 mg rectally daily as needed for nausea or vomiting., Disp: , Rfl:    rOPINIRole  (REQUIP ) 2 MG tablet, Take 2 mg by mouth at bedtime., Disp: , Rfl:    tamsulosin  (FLOMAX ) 0.4 MG CAPS capsule, Take 0.4 mg by mouth in the morning., Disp: , Rfl:    amiodarone  (PACERONE ) 200 MG tablet, Take 0.5 tablets (100 mg total) by mouth daily., Disp: , Rfl:    [Paused] amLODipine  (NORVASC ) 10 MG tablet, Take 10 mg by mouth in the morning. (Patient not taking: Reported on 01/01/2024), Disp: , Rfl:    [Paused] isosorbide  mononitrate (IMDUR ) 60 MG 24 hr tablet, Take 1 tablet (60 mg total) by mouth daily. (Patient not taking: Reported on  12/02/2023), Disp: 90 tablet, Rfl: 1   Meds ordered this encounter  Medications   amiodarone  (PACERONE ) 200 MG tablet    Sig: Take 0.5 tablets (100 mg total) by mouth daily.   carvedilol  (COREG ) 3.125 MG tablet    Sig: Take 1 tablet (3.125 mg total) by mouth 2 (two) times daily with a meal.     Medications Discontinued During This Encounter  Medication Reason   insulin  aspart (NOVOLOG ) 100 UNIT/ML injection Duplicate   amiodarone  (PACERONE ) 200 MG tablet    metoprolol  succinate (TOPROL -XL) 25 MG 24 hr tablet Change in therapy     ASSESSMENT AND PLAN: .      ICD-10-CM   1. Coronary artery disease of native artery of native heart with stable angina pectoris (HCC)  I25.118 carvedilol  (COREG ) 3.125 MG tablet    2. Chronic heart failure with preserved ejection fraction (HCC)  I50.32 carvedilol  (COREG ) 3.125 MG tablet    3. Primary hypertension  I10     4. Paroxysmal atrial fibrillation with RVR (HCC)  I48.0 amiodarone  (PACERONE ) 200 MG tablet      Assessment and Plan Assessment & Plan Coronary artery disease with stents   He was recently hospitalized due to significant coronary artery blockage, with previous stents occluded, requiring two additional stents. Smoking contributes to stent occlusion. Advise smoking cessation to prevent further occlusion.  Tobacco use   Continued smoking exacerbates coronary artery disease and stent occlusion. Advise smoking cessation.  Atrial fibrillation   He is on amiodarone  200 mg daily, maintaining a regular rhythm, allowing for dose  reduction. Reduce amiodarone  to 100 mg once daily.  Hypertension   Blood pressure fluctuates, sometimes exceeding 200 mmHg, especially around dialysis sessions. Previous antihypertensive medications were paused due to hypotension risk post-dialysis. Current blood pressure is 130/60 mmHg. Initiate carvedilol  3.125 mg twice daily, holding on dialysis days. Discontinue metoprolol  succinate.  End stage renal disease on  dialysis   Blood pressure management is complicated by dialysis, with a risk of hypotension post-dialysis. Hold carvedilol  on dialysis days to prevent hypotension.  Signed,  Knox Perl, MD, [facility] 01/01/2024, 12:57 PM [facility] [address] [place], [place] [address] Phone: [phone]. Fax:  [phone]

## 2024-01-01 ENCOUNTER — Encounter: Payer: Self-pay | Admitting: Cardiology

## 2024-01-01 ENCOUNTER — Ambulatory Visit: Payer: Medicare HMO | Attending: Cardiology | Admitting: Cardiology

## 2024-01-01 VITALS — BP 130/60 | HR 70 | Resp 16 | Ht 74.0 in | Wt 248.0 lb

## 2024-01-01 DIAGNOSIS — I480 Paroxysmal atrial fibrillation: Secondary | ICD-10-CM | POA: Diagnosis not present

## 2024-01-01 DIAGNOSIS — I10 Essential (primary) hypertension: Secondary | ICD-10-CM

## 2024-01-01 DIAGNOSIS — I25118 Atherosclerotic heart disease of native coronary artery with other forms of angina pectoris: Secondary | ICD-10-CM

## 2024-01-01 DIAGNOSIS — I5032 Chronic diastolic (congestive) heart failure: Secondary | ICD-10-CM

## 2024-01-01 DIAGNOSIS — Z992 Dependence on renal dialysis: Secondary | ICD-10-CM

## 2024-01-01 MED ORDER — AMIODARONE HCL 200 MG PO TABS: 100.0000 mg | ORAL_TABLET | Freq: Every day | ORAL | Status: DC

## 2024-01-01 MED ORDER — CARVEDILOL 3.125 MG PO TABS: 3.1250 mg | ORAL_TABLET | Freq: Two times a day (BID) | ORAL | Status: DC

## 2024-01-01 NOTE — Patient Instructions (Signed)
 Medication Instructions:  Your physician has recommended you make the following change in your medication:  Stop metoprolol  succinate Decrease amiodarone  to 100 mg daily  Start carvedilol  3.125 mg by mouth twice daily   *If you need a refill on your cardiac medications before your next appointment, please call your pharmacy*  Lab Work: none If you have labs (blood work) drawn today and your tests are completely normal, you will receive your results only by: MyChart Message (if you have MyChart) OR A paper copy in the mail If you have any lab test that is abnormal or we need to change your treatment, we will call you to review the results.  Testing/Procedures: none  Follow-Up: At [facility], you and your health needs are our priority.  As part of our continuing mission to provide you with exceptional heart care, our providers are all part of one team.  This team includes your primary Cardiologist (physician) and Advanced Practice Providers or APPs (Physician Assistants and Nurse Practitioners) who all work together to provide you with the care you need, when you need it.  Your next appointment:   6 month(s)  Provider:   Knox Perl, MD     We recommend signing up for the patient portal called "MyChart".  Sign up information is provided on this After Visit Summary.  MyChart is used to connect with patients for Virtual Visits (Telemedicine).  Patients are able to view lab/test results, encounter notes, upcoming appointments, etc.  Non-urgent messages can be sent to your provider as well.   To learn more about what you can do with MyChart, go to [email or web address].   Other Instructions       1st Floor: - Lobby - Registration  - Pharmacy  - Lab - Cafe  2nd Floor: - PV Lab - Diagnostic Testing (echo, CT, nuclear med)  3rd Floor: - Vacant  4th Floor: - TCTS (cardiothoracic surgery) - AFib Clinic - Structural Heart Clinic - Vascular Surgery  - Vascular  Ultrasound  5th Floor: - HeartCare Cardiology (general and EP) - Clinical Pharmacy for coumadin, hypertension, lipid, weight-loss medications, and med management appointments    Valet parking services will be available as well.

## 2024-01-02 DIAGNOSIS — N186 End stage renal disease: Secondary | ICD-10-CM | POA: Diagnosis not present

## 2024-01-02 DIAGNOSIS — I2510 Atherosclerotic heart disease of native coronary artery without angina pectoris: Secondary | ICD-10-CM | POA: Diagnosis not present

## 2024-01-02 DIAGNOSIS — Z992 Dependence on renal dialysis: Secondary | ICD-10-CM | POA: Diagnosis not present

## 2024-01-02 DIAGNOSIS — I482 Chronic atrial fibrillation, unspecified: Secondary | ICD-10-CM | POA: Diagnosis not present

## 2024-01-08 DIAGNOSIS — Z992 Dependence on renal dialysis: Secondary | ICD-10-CM | POA: Diagnosis not present

## 2024-01-08 DIAGNOSIS — N186 End stage renal disease: Secondary | ICD-10-CM | POA: Diagnosis not present

## 2024-01-08 DIAGNOSIS — E1122 Type 2 diabetes mellitus with diabetic chronic kidney disease: Secondary | ICD-10-CM | POA: Diagnosis not present

## 2024-01-08 DIAGNOSIS — I120 Hypertensive chronic kidney disease with stage 5 chronic kidney disease or end stage renal disease: Secondary | ICD-10-CM | POA: Diagnosis not present

## 2024-01-09 DIAGNOSIS — M1990 Unspecified osteoarthritis, unspecified site: Secondary | ICD-10-CM | POA: Diagnosis not present

## 2024-01-09 DIAGNOSIS — G8929 Other chronic pain: Secondary | ICD-10-CM | POA: Diagnosis not present

## 2024-01-22 DIAGNOSIS — H02833 Dermatochalasis of right eye, unspecified eyelid: Secondary | ICD-10-CM | POA: Diagnosis not present

## 2024-01-22 DIAGNOSIS — Z794 Long term (current) use of insulin: Secondary | ICD-10-CM | POA: Diagnosis not present

## 2024-01-22 DIAGNOSIS — H43813 Vitreous degeneration, bilateral: Secondary | ICD-10-CM | POA: Diagnosis not present

## 2024-01-22 DIAGNOSIS — E113511 Type 2 diabetes mellitus with proliferative diabetic retinopathy with macular edema, right eye: Secondary | ICD-10-CM | POA: Diagnosis not present

## 2024-01-22 DIAGNOSIS — E113512 Type 2 diabetes mellitus with proliferative diabetic retinopathy with macular edema, left eye: Secondary | ICD-10-CM | POA: Diagnosis not present

## 2024-01-22 DIAGNOSIS — H02836 Dermatochalasis of left eye, unspecified eyelid: Secondary | ICD-10-CM | POA: Diagnosis not present

## 2024-01-30 DIAGNOSIS — Z992 Dependence on renal dialysis: Secondary | ICD-10-CM | POA: Diagnosis not present

## 2024-01-30 DIAGNOSIS — G8929 Other chronic pain: Secondary | ICD-10-CM | POA: Diagnosis not present

## 2024-01-30 DIAGNOSIS — Z91158 Patient's noncompliance with renal dialysis for other reason: Secondary | ICD-10-CM | POA: Diagnosis not present

## 2024-01-30 DIAGNOSIS — N186 End stage renal disease: Secondary | ICD-10-CM | POA: Diagnosis not present

## 2024-01-31 ENCOUNTER — Inpatient Hospital Stay (HOSPITAL_COMMUNITY)
Admission: EM | Admit: 2024-01-31 | Discharge: 2024-02-04 | DRG: 371 | Disposition: A | Discharge status: Skilled Nursing Facility | Source: Skilled Nursing Facility | Attending: Internal Medicine | Admitting: Internal Medicine

## 2024-01-31 ENCOUNTER — Emergency Department (HOSPITAL_COMMUNITY)

## 2024-01-31 ENCOUNTER — Encounter (HOSPITAL_COMMUNITY): Payer: Self-pay

## 2024-01-31 ENCOUNTER — Other Ambulatory Visit: Payer: Self-pay

## 2024-01-31 DIAGNOSIS — E1122 Type 2 diabetes mellitus with diabetic chronic kidney disease: Secondary | ICD-10-CM | POA: Diagnosis present

## 2024-01-31 DIAGNOSIS — Z79891 Long term (current) use of opiate analgesic: Secondary | ICD-10-CM

## 2024-01-31 DIAGNOSIS — Z91158 Patient's noncompliance with renal dialysis for other reason: Secondary | ICD-10-CM

## 2024-01-31 DIAGNOSIS — G2581 Restless legs syndrome: Secondary | ICD-10-CM | POA: Diagnosis present

## 2024-01-31 DIAGNOSIS — L89151 Pressure ulcer of sacral region, stage 1: Secondary | ICD-10-CM | POA: Diagnosis present

## 2024-01-31 DIAGNOSIS — A0471 Enterocolitis due to Clostridium difficile, recurrent: Secondary | ICD-10-CM | POA: Diagnosis not present

## 2024-01-31 DIAGNOSIS — Z7902 Long term (current) use of antithrombotics/antiplatelets: Secondary | ICD-10-CM

## 2024-01-31 DIAGNOSIS — Z89512 Acquired absence of left leg below knee: Secondary | ICD-10-CM

## 2024-01-31 DIAGNOSIS — Z955 Presence of coronary angioplasty implant and graft: Secondary | ICD-10-CM

## 2024-01-31 DIAGNOSIS — Z83438 Family history of other disorder of lipoprotein metabolism and other lipidemia: Secondary | ICD-10-CM

## 2024-01-31 DIAGNOSIS — Z992 Dependence on renal dialysis: Secondary | ICD-10-CM

## 2024-01-31 DIAGNOSIS — K219 Gastro-esophageal reflux disease without esophagitis: Secondary | ICD-10-CM | POA: Diagnosis present

## 2024-01-31 DIAGNOSIS — E860 Dehydration: Secondary | ICD-10-CM | POA: Diagnosis present

## 2024-01-31 DIAGNOSIS — Z794 Long term (current) use of insulin: Secondary | ICD-10-CM

## 2024-01-31 DIAGNOSIS — R197 Diarrhea, unspecified: Secondary | ICD-10-CM | POA: Diagnosis not present

## 2024-01-31 DIAGNOSIS — Z91041 Radiographic dye allergy status: Secondary | ICD-10-CM

## 2024-01-31 DIAGNOSIS — E669 Obesity, unspecified: Secondary | ICD-10-CM | POA: Diagnosis present

## 2024-01-31 DIAGNOSIS — N186 End stage renal disease: Secondary | ICD-10-CM | POA: Diagnosis present

## 2024-01-31 DIAGNOSIS — J449 Chronic obstructive pulmonary disease, unspecified: Secondary | ICD-10-CM | POA: Diagnosis present

## 2024-01-31 DIAGNOSIS — Z833 Family history of diabetes mellitus: Secondary | ICD-10-CM

## 2024-01-31 DIAGNOSIS — I10 Essential (primary) hypertension: Secondary | ICD-10-CM | POA: Diagnosis not present

## 2024-01-31 DIAGNOSIS — Z9049 Acquired absence of other specified parts of digestive tract: Secondary | ICD-10-CM

## 2024-01-31 DIAGNOSIS — I480 Paroxysmal atrial fibrillation: Secondary | ICD-10-CM | POA: Diagnosis present

## 2024-01-31 DIAGNOSIS — M170 Bilateral primary osteoarthritis of knee: Secondary | ICD-10-CM | POA: Diagnosis present

## 2024-01-31 DIAGNOSIS — Z96651 Presence of right artificial knee joint: Secondary | ICD-10-CM | POA: Diagnosis present

## 2024-01-31 DIAGNOSIS — A0472 Enterocolitis due to Clostridium difficile, not specified as recurrent: Secondary | ICD-10-CM | POA: Diagnosis present

## 2024-01-31 DIAGNOSIS — Z9109 Other allergy status, other than to drugs and biological substances: Secondary | ICD-10-CM

## 2024-01-31 DIAGNOSIS — G8929 Other chronic pain: Secondary | ICD-10-CM | POA: Diagnosis present

## 2024-01-31 DIAGNOSIS — L89891 Pressure ulcer of other site, stage 1: Secondary | ICD-10-CM | POA: Diagnosis present

## 2024-01-31 DIAGNOSIS — E1142 Type 2 diabetes mellitus with diabetic polyneuropathy: Secondary | ICD-10-CM | POA: Diagnosis present

## 2024-01-31 DIAGNOSIS — Z91040 Latex allergy status: Secondary | ICD-10-CM

## 2024-01-31 DIAGNOSIS — I252 Old myocardial infarction: Secondary | ICD-10-CM

## 2024-01-31 DIAGNOSIS — K760 Fatty (change of) liver, not elsewhere classified: Secondary | ICD-10-CM | POA: Diagnosis present

## 2024-01-31 DIAGNOSIS — Z79899 Other long term (current) drug therapy: Secondary | ICD-10-CM

## 2024-01-31 DIAGNOSIS — Z6833 Body mass index (BMI) 33.0-33.9, adult: Secondary | ICD-10-CM

## 2024-01-31 DIAGNOSIS — M1909 Primary osteoarthritis, other specified site: Secondary | ICD-10-CM | POA: Diagnosis present

## 2024-01-31 DIAGNOSIS — E785 Hyperlipidemia, unspecified: Secondary | ICD-10-CM | POA: Diagnosis present

## 2024-01-31 DIAGNOSIS — G253 Myoclonus: Secondary | ICD-10-CM | POA: Diagnosis present

## 2024-01-31 DIAGNOSIS — M479 Spondylosis, unspecified: Secondary | ICD-10-CM | POA: Diagnosis present

## 2024-01-31 DIAGNOSIS — F17210 Nicotine dependence, cigarettes, uncomplicated: Secondary | ICD-10-CM | POA: Diagnosis present

## 2024-01-31 DIAGNOSIS — M19042 Primary osteoarthritis, left hand: Secondary | ICD-10-CM | POA: Diagnosis present

## 2024-01-31 DIAGNOSIS — E872 Acidosis, unspecified: Secondary | ICD-10-CM | POA: Diagnosis present

## 2024-01-31 DIAGNOSIS — R935 Abnormal findings on diagnostic imaging of other abdominal regions, including retroperitoneum: Secondary | ICD-10-CM | POA: Diagnosis not present

## 2024-01-31 DIAGNOSIS — M19041 Primary osteoarthritis, right hand: Secondary | ICD-10-CM | POA: Diagnosis present

## 2024-01-31 DIAGNOSIS — E1151 Type 2 diabetes mellitus with diabetic peripheral angiopathy without gangrene: Secondary | ICD-10-CM | POA: Diagnosis present

## 2024-01-31 DIAGNOSIS — E8770 Fluid overload, unspecified: Secondary | ICD-10-CM | POA: Diagnosis present

## 2024-01-31 DIAGNOSIS — Z8249 Family history of ischemic heart disease and other diseases of the circulatory system: Secondary | ICD-10-CM

## 2024-01-31 DIAGNOSIS — Z7901 Long term (current) use of anticoagulants: Secondary | ICD-10-CM

## 2024-01-31 DIAGNOSIS — L8990 Pressure ulcer of unspecified site, unspecified stage: Secondary | ICD-10-CM | POA: Insufficient documentation

## 2024-01-31 DIAGNOSIS — K529 Noninfective gastroenteritis and colitis, unspecified: Secondary | ICD-10-CM | POA: Diagnosis not present

## 2024-01-31 DIAGNOSIS — Z8673 Personal history of transient ischemic attack (TIA), and cerebral infarction without residual deficits: Secondary | ICD-10-CM

## 2024-01-31 DIAGNOSIS — D631 Anemia in chronic kidney disease: Secondary | ICD-10-CM | POA: Diagnosis present

## 2024-01-31 DIAGNOSIS — E113512 Type 2 diabetes mellitus with proliferative diabetic retinopathy with macular edema, left eye: Secondary | ICD-10-CM | POA: Diagnosis not present

## 2024-01-31 DIAGNOSIS — I2510 Atherosclerotic heart disease of native coronary artery without angina pectoris: Secondary | ICD-10-CM | POA: Diagnosis present

## 2024-01-31 DIAGNOSIS — G4730 Sleep apnea, unspecified: Secondary | ICD-10-CM | POA: Diagnosis present

## 2024-01-31 DIAGNOSIS — Z89511 Acquired absence of right leg below knee: Secondary | ICD-10-CM

## 2024-01-31 DIAGNOSIS — R109 Unspecified abdominal pain: Secondary | ICD-10-CM | POA: Diagnosis not present

## 2024-01-31 DIAGNOSIS — G9341 Metabolic encephalopathy: Secondary | ICD-10-CM | POA: Diagnosis present

## 2024-01-31 DIAGNOSIS — N2581 Secondary hyperparathyroidism of renal origin: Secondary | ICD-10-CM | POA: Diagnosis present

## 2024-01-31 DIAGNOSIS — R1011 Right upper quadrant pain: Secondary | ICD-10-CM | POA: Diagnosis not present

## 2024-01-31 DIAGNOSIS — I120 Hypertensive chronic kidney disease with stage 5 chronic kidney disease or end stage renal disease: Secondary | ICD-10-CM | POA: Diagnosis present

## 2024-01-31 LAB — COMPREHENSIVE METABOLIC PANEL WITH GFR
ALT: 11 U/L (ref 0–44)
AST: 17 U/L (ref 15–41)
Albumin: 2.3 g/dL — ABNORMAL LOW (ref 3.5–5.0)
Alkaline Phosphatase: 88 U/L (ref 38–126)
Anion gap: 17 — ABNORMAL HIGH (ref 5–15)
BUN: 89 mg/dL — ABNORMAL HIGH (ref 8–23)
CO2: 15 mmol/L — ABNORMAL LOW (ref 22–32)
Calcium: 5.8 mg/dL — CL (ref 8.9–10.3)
Chloride: 106 mmol/L (ref 98–111)
Creatinine, Ser: 10.65 mg/dL — ABNORMAL HIGH (ref 0.61–1.24)
GFR, Estimated: 5 mL/min — ABNORMAL LOW (ref >60)
Glucose, Bld: 160 mg/dL — ABNORMAL HIGH (ref 70–99)
Potassium: 3.8 mmol/L (ref 3.5–5.1)
Sodium: 138 mmol/L (ref 135–145)
Total Bilirubin: 0.6 mg/dL (ref 0.0–1.2)
Total Protein: 4.6 g/dL — ABNORMAL LOW (ref 6.5–8.1)

## 2024-01-31 LAB — CBC WITH DIFFERENTIAL/PLATELET
Abs Immature Granulocytes: 0.05 10*3/uL (ref 0.00–0.07)
Basophils Absolute: 0 10*3/uL (ref 0.0–0.1)
Basophils Relative: 0 %
Eosinophils Absolute: 0.3 10*3/uL (ref 0.0–0.5)
Eosinophils Relative: 3 %
HCT: 29.4 % — ABNORMAL LOW (ref 39.0–52.0)
Hemoglobin: 9.5 g/dL — ABNORMAL LOW (ref 13.0–17.0)
Immature Granulocytes: 1 %
Lymphocytes Relative: 7 %
Lymphs Abs: 0.7 10*3/uL (ref 0.7–4.0)
MCH: 29.8 pg (ref 26.0–34.0)
MCHC: 32.3 g/dL (ref 30.0–36.0)
MCV: 92.2 fL (ref 80.0–100.0)
Monocytes Absolute: 0.8 10*3/uL (ref 0.1–1.0)
Monocytes Relative: 8 %
Neutro Abs: 8.2 10*3/uL — ABNORMAL HIGH (ref 1.7–7.7)
Neutrophils Relative %: 81 %
Platelets: 154 10*3/uL (ref 150–400)
RBC: 3.19 MIL/uL — ABNORMAL LOW (ref 4.22–5.81)
RDW: 15.9 % — ABNORMAL HIGH (ref 11.5–15.5)
WBC: 10.1 10*3/uL (ref 4.0–10.5)
nRBC: 0 % (ref 0.0–0.2)

## 2024-01-31 LAB — MAGNESIUM: Magnesium: 1.3 mg/dL — ABNORMAL LOW (ref 1.7–2.4)

## 2024-01-31 LAB — PHOSPHORUS: Phosphorus: 9.2 mg/dL — ABNORMAL HIGH (ref 2.5–4.6)

## 2024-01-31 LAB — LIPASE, BLOOD: Lipase: 20 U/L (ref 11–51)

## 2024-01-31 MED ORDER — CALCIUM GLUCONATE-NACL 1-0.675 GM/50ML-% IV SOLN
1.0000 g | Freq: Once | INTRAVENOUS | Status: AC
  Administered 2024-02-01: 1000 mg via INTRAVENOUS
  Filled 2024-01-31: qty 50

## 2024-01-31 MED ORDER — METRONIDAZOLE 500 MG/100ML IV SOLN
500.0000 mg | Freq: Three times a day (TID) | INTRAVENOUS | Status: DC
  Administered 2024-02-01 (×2): 500 mg via INTRAVENOUS
  Filled 2024-01-31 (×2): qty 100

## 2024-01-31 MED ORDER — SODIUM CHLORIDE 0.9 % IV SOLN
1.0000 g | Freq: Once | INTRAVENOUS | Status: AC
  Administered 2024-01-31: 1 g via INTRAVENOUS
  Filled 2024-01-31: qty 10

## 2024-01-31 MED ORDER — SODIUM BICARBONATE 8.4 % IV SOLN
50.0000 meq | Freq: Once | INTRAVENOUS | Status: AC
  Administered 2024-02-01: 50 meq via INTRAVENOUS
  Filled 2024-01-31: qty 50

## 2024-01-31 MED ORDER — MAGNESIUM SULFATE 2 GM/50ML IV SOLN
2.0000 g | Freq: Once | INTRAVENOUS | Status: AC
  Administered 2024-02-01: 2 g via INTRAVENOUS
  Filled 2024-01-31: qty 50

## 2024-01-31 NOTE — ED Provider Notes (Signed)
 [place] EMERGENCY DEPARTMENT AT [facility] Provider Note   CSN: [id number] Arrival date & time: 01/31/24  1733     History  No chief complaint on file.   [name] is a 68 y.o. male with a medical history of end-stage renal disease on dialysis, paroxysmal A-fib, presenting from [facility] long term care staff with concern for diarrhea and weakness.  Patient reports that he has had frequent loose stools for the past 4 days.  He says he has been eating okay but feeling queasy with diminished appetite.  He feels somewhat dehydrated.  He feels that he has missed about 3 sessions of dialysis due to the amount of diarrhea that he is having.  He did have C. difficile back in January but says that this feels "different" because it is not as watery, and also he has not been on antibiotics recently.  Patient has a history of cholecystectomy and common bile duct dilatation  I reviewed external records.  Patient most recently discharged from the hospital 2 months ago on March 2025, after admission for an NSTEMI with left heart cath and successful drug-eluting stent in the proximal LAC and partially successful lithotripsy and ballooning of proximal RCA lesion.  He was noted to have bradycardia at the time.  He was started on triple therapy with aspirin , Plavix  and Eliquis , then drops to Plavix  and Eliquis  currently for the next 12 months.  C Diff positive and treated in Jan 2025, also had norovirus at the time, treated with oral vancomycin   HPI     Home Medications Prior to Admission medications   Medication Sig Start Date End Date Taking? Authorizing Provider  amiodarone  (PACERONE ) 200 MG tablet Take 0.5 tablets (100 mg total) by mouth daily. 01/01/24   Knox Perl, MD  amLODipine  (NORVASC ) 10 MG tablet Take 10 mg by mouth in the morning. Patient not taking: Reported on 01/01/2024    [provider]  apixaban  (ELIQUIS ) 5 MG TABS tablet Take 1 tablet (5 mg total) by mouth 2  (two) times daily. 01/14/22 03/09/24  Gonfa, Taye T, MD  atorvastatin  (LIPITOR) 80 MG tablet Take 1 tablet (80 mg total) by mouth at bedtime. 11/25/23   Amoako, Prince, MD  B Complex-C-Zn-Folic Acid  (DIALYVITE /ZINC ) TABS Take 1 tablet by mouth Every Tuesday,Thursday,and Saturday with dialysis. 10/04/21   [provider]  carvedilol  (COREG ) 3.125 MG tablet Take 1 tablet (3.125 mg total) by mouth 2 (two) times daily with a meal. 01/01/24   Knox Perl, MD  clopidogrel  (PLAVIX ) 75 MG tablet Take 1 tablet (75 mg total) by mouth daily with breakfast. 11/25/23 11/24/24  Amoako, Prince, MD  famotidine  (PEPCID ) 20 MG tablet Take 20 mg by mouth every other day.    [provider]  fentaNYL  (DURAGESIC ) 50 MCG/HR 1 patch every 3 (three) days. 07/25/23   [provider]  folic acid  (FOLVITE ) 1 MG tablet Take 1 mg by mouth in the morning. 09/18/21   [provider]  gabapentin  (NEURONTIN ) 100 MG capsule Take 100 mg by mouth 3 (three) times daily.    [provider]  hydrALAZINE  (APRESOLINE ) 50 MG tablet Take 50 mg by mouth every 8 (eight) hours.    [provider]  Icosapent  Ethyl 0.5 g CAPS Take 1 capsule by mouth in the morning.    [provider]  insulin  aspart (NOVOLOG  FLEXPEN) 100 UNIT/ML FlexPen Inject 0-12 Units into the skin See admin instructions. Inject 4 units into the skin with  meals in addition to sliding scale as needed. Inject as per sliding scale: If 0 - 69 = 0 initiate hypoglycemia orders;  70 - 150 = 0;  151 - 200 = 2;  201 - 250 = 4;  251 - 300 = 6;  301 - 350 = 8;  351 - 400 = 10;  401 - 450 = 12.  If >400, give 12 units, recheck in 1 hour. If still >400, call MD/NP for further orders. Subcutaneously with meals in addition to scheduled 4 unites with meals.    [provider]  isosorbide  mononitrate (IMDUR ) 60 MG 24 hr tablet Take 1 tablet (60 mg total) by mouth daily. Patient not taking: Reported on 12/02/2023 01/21/23 01/08/24   Knox Perl, MD  lidocaine -prilocaine  (EMLA ) cream Apply 1 application topically See admin instructions. Prior to Dialysis days Tuesday,Thursday and saturday 06/06/21   [provider]  liver oil-zinc  oxide (DESITIN) 40 % ointment Apply 1 Application topically as needed for irritation.    [provider]  loratadine  (CLARITIN ) 10 MG tablet Take 10 mg by mouth in the morning.    [provider]  midodrine  (PROAMATINE ) 10 MG tablet Take 10 mg by mouth daily as needed (SBP less than 100.).    [provider]  mineral oil-hydrophilic petrolatum (AQUAPHOR) ointment Apply 1 Application topically as needed for dry skin or irritation (Upper thigh).    [provider]  nitroGLYCERIN  (NITROSTAT ) 0.4 MG SL tablet Place 0.4 mg under the tongue every 5 (five) minutes as needed for chest pain. 01/06/22   [provider]  oxyCODONE -acetaminophen  (PERCOCET /ROXICET) 5-325 MG tablet Take 1 tablet by mouth every 8 (eight) hours as needed for severe pain. 05/31/23   Ghimire, Estil Heman, MD  promethazine  (PHENERGAN ) 12.5 MG suppository Place 12.5 mg rectally daily as needed for nausea or vomiting.    [provider]  rOPINIRole  (REQUIP ) 2 MG tablet Take 2 mg by mouth at bedtime.    [provider]  tamsulosin  (FLOMAX ) 0.4 MG CAPS capsule Take 0.4 mg by mouth in the morning.    [provider]      Allergies    Contrast media [iodinated contrast media], Ivp dye [iodinated contrast media], Latex, and Tape    Review of Systems   Review of Systems  Physical Exam Updated Vital Signs BP (!) 107/59   Pulse 72   Temp 100.2 F (37.9 C)   Resp 11   Wt 111.6 kg   SpO2 94%   BMI 31.58 kg/m  Physical Exam Constitutional:      General: He is not in acute distress.    Appearance: He is obese.  HENT:     Head: Normocephalic and atraumatic.  Eyes:     Conjunctiva/sclera: Conjunctivae normal.     Pupils: Pupils are equal, round, and reactive  to light.  Cardiovascular:     Rate and Rhythm: Normal rate. Rhythm irregular.  Pulmonary:     Effort: Pulmonary effort is normal. No respiratory distress.  Abdominal:     General: There is no distension.     Tenderness: There is no abdominal tenderness.  Musculoskeletal:     Comments: BKA bilaterally  Skin:    General: Skin is warm and dry.  Neurological:     General: No focal deficit present.     Mental Status: He is alert. Mental status is at baseline.  Psychiatric:        Mood and Affect: Mood normal.  Behavior: Behavior normal.     ED Results / Procedures / Treatments   Labs (all labs ordered are listed, but only abnormal results are displayed) Labs Reviewed  COMPREHENSIVE METABOLIC PANEL WITH GFR - Abnormal; Notable for the following components:      Result Value   CO2 15 (*)    Glucose, Bld 160 (*)    BUN 89 (*)    Creatinine, Ser 10.65 (*)    Calcium  5.8 (*)    Total Protein 4.6 (*)    Albumin  2.3 (*)    GFR, Estimated 5 (*)    Anion gap 17 (*)    All other components within normal limits  CBC WITH DIFFERENTIAL/PLATELET - Abnormal; Notable for the following components:   RBC 3.19 (*)    Hemoglobin 9.5 (*)    HCT 29.4 (*)    RDW 15.9 (*)    Neutro Abs 8.2 (*)    All other components within normal limits  MAGNESIUM  - Abnormal; Notable for the following components:   Magnesium  1.3 (*)    All other components within normal limits  PHOSPHORUS - Abnormal; Notable for the following components:   Phosphorus 9.2 (*)    All other components within normal limits  C DIFFICILE QUICK SCREEN W PCR REFLEX    GASTROINTESTINAL PANEL BY PCR, STOOL (REPLACES STOOL CULTURE)  LIPASE, BLOOD    EKG None  Radiology CT ABDOMEN PELVIS WO CONTRAST Result Date: 01/31/2024 CLINICAL DATA:  Right upper quadrant pain diarrhea EXAM: CT ABDOMEN AND PELVIS WITHOUT CONTRAST TECHNIQUE: Multidetector CT imaging of the abdomen and pelvis was performed following the standard protocol  without IV contrast. RADIATION DOSE REDUCTION: This exam was performed according to the departmental dose-optimization program which includes automated exposure control, adjustment of the mA and/or kV according to patient size and/or use of iterative reconstruction technique. COMPARISON:  CT 07/18/2023 FINDINGS: Lower chest: Lung bases demonstrate mild dependent atelectasis. Trace pleural effusions. Cardiomegaly with coronary vascular calcification. Aortic valvular calcification. Mitral calcification. Hepatobiliary: Cholecystectomy. No focal hepatic abnormality or biliary dilatation. Pancreas: No inflammation. Atrophic. Stable cystic lesion at the body of the pancreas measuring about 2.0 by 1.8 cm on series 3, image 31. Spleen: Normal in size without focal abnormality. Adrenals/Urinary Tract: Adrenal glands are normal. Atrophic kidneys without hydronephrosis. Urinary bladder is slightly thick walled with mild perivesical stranding Stomach/Bowel: Stomach nonenlarged. No dilated small bowel. Diffuse colon wall thickening with mild pericolonic stranding at the ascending colon and descending and rectosigmoid colon. Postsurgical changes at the sigmoid colon. Circumferential wall thickening at the rectum. Vascular/Lymphatic: Advanced aortic atherosclerosis without aneurysm. Multiple small retroperitoneal lymph nodes, all subcentimeter. Reproductive: Negative prostate Other: Negative for pelvic effusion or free air. Some perirectal stranding. Musculoskeletal: Spinal fusion hardware L4 through S1. No acute osseous abnormality IMPRESSION: 1. Findings consistent with diffuse proctocolitis likely infectious (? C diff) or inflammatory. No evidence for perforation. 2. Mild bladder wall thickening with perivesical stranding, correlate with urinalysis to exclude cystitis. 3. Cardiomegaly with trace pleural effusions. 4. Atrophic kidneys. 5. Stable small cystic pancreatic lesion. 6. Aortic atherosclerosis. Aortic Atherosclerosis  (ICD10-I70.0). Electronically Signed   By: Esmeralda Hedge M.D.   On: 01/31/2024 21:05    Procedures .Critical Care  Performed by: Arvilla Birmingham, MD Authorized by: Arvilla Birmingham, MD   Critical care provider statement:    Critical care time (minutes):  45   Critical care time was exclusive of:  Separately billable procedures and treating other patients   Critical care was  necessary to treat or prevent imminent or life-threatening deterioration of the following conditions:  Metabolic crisis   Critical care was time spent personally by me on the following activities:  Ordering and performing treatments and interventions, ordering and review of laboratory studies, ordering and review of radiographic studies, pulse oximetry, review of old charts, examination of patient and evaluation of patient's response to treatment   Care discussed with: admitting provider   Comments:     Electrolyte repletion     Medications Ordered in ED Medications  cefTRIAXone  (ROCEPHIN ) 1 g in sodium chloride  0.9 % 100 mL IVPB (1 g Intravenous New Bag/Given 01/31/24 2349)  metroNIDAZOLE  (FLAGYL ) IVPB 500 mg (has no administration in time range)  sodium bicarbonate  injection 50 mEq (has no administration in time range)  magnesium  sulfate IVPB 2 g 50 mL (has no administration in time range)  calcium  gluconate 1 g/ 50 mL sodium chloride  IVPB (has no administration in time range)    ED Course/ Medical Decision Making/ A&P Clinical Course as of 01/31/24 2354  Fri Jan 31, 2024  2333 Pt admitted to hospitalist dr Michell Ahumada - requesting antibiotics rocephin  and flagyl  empiric coverage for colitis/c.diff while awaiting stool, and electrolyte repletion, which is ordered, Mag and Ca and bicarb [MT]    Clinical Course User Index [MT] Arvilla Birmingham, MD                                 Medical Decision Making Amount and/or Complexity of Data Reviewed Labs: ordered. Radiology: ordered.  Risk Prescription drug  management. Decision regarding hospitalization.   This patient presents to the ED with concern for diarrhea, poor appetite. This involves an extensive number of treatment options, and is a complaint that carries with it a high risk of complications and morbidity.  The differential diagnosis includes gastritis versus colitis versus other  Patient is also missed a few sessions of dialysis.  He does not appear volume overloaded, and he does produce urine.  However we will check his electrolyte levels.  Recurrent C. difficile or other infectious colitis would be on the differential.  The patient is not on antibiotics recently per his med list recollection  Co-morbidities that complicate the patient evaluation: History of prior C. difficile, at risk of recurrence  Additional history obtained from EMS  External records from outside source obtained and reviewed including hospital records from January and March including discharge summary  I ordered and personally interpreted labs.  The pertinent results include: Hypocalcemia, chronic kidney disease, hypomagnesemia, some elevated phosphorus at 9.2.  Bicarb low at 15.  I ordered imaging studies including CT abdomen pelvis I independently visualized and interpreted imaging which showed concern for proctocolitis I agree with the radiologist interpretation  The patient was maintained on a cardiac monitor.  I personally viewed and interpreted the cardiac monitored which showed an underlying rhythm of: Sinus rhythm  I ordered medication including IV antibiotics for colitis, IV bicarb and electrolyte repletion  I have reviewed the patients home medicines and have made adjustments as needed  Test Considered: Doubt AAA, sepsis  After the interventions noted above, I reevaluated the patient and found that they have: stayed the same  At this time there is no emergent indication for dialysis.  The patient will be stable to be admitted to the hospitalist  overnight for electrolyte repletion and for follow-up on stool studies, which he has not yet been able to  provide a sample for.  Disposition:  After consideration of the diagnostic results and the patients response to treatment, I feel that the patient would benefit from medical admission.         Final Clinical Impression(s) / ED Diagnoses Final diagnoses:  Hypomagnesemia  Colitis  Hypocalcemia    Rx / DC Orders ED Discharge Orders     None         Jonah Nestle, Janalyn Me, MD 01/31/24 2354

## 2024-01-31 NOTE — ED Triage Notes (Signed)
 Pt BIB [facility] from [facility] with c/o diarrhea and abdominal pain x5 days. Pt missed dialysis 3 times d/t diarrhea. Pt has hx of cdiff but reports it feels different than last time. Pt reports RUQ abdominal pain that is worse on palpation. Pt has bilateral BKAs. VSS per EMS.

## 2024-02-01 DIAGNOSIS — Z992 Dependence on renal dialysis: Secondary | ICD-10-CM | POA: Diagnosis not present

## 2024-02-01 DIAGNOSIS — E1142 Type 2 diabetes mellitus with diabetic polyneuropathy: Secondary | ICD-10-CM | POA: Diagnosis not present

## 2024-02-01 DIAGNOSIS — Z743 Need for continuous supervision: Secondary | ICD-10-CM | POA: Diagnosis not present

## 2024-02-01 DIAGNOSIS — E669 Obesity, unspecified: Secondary | ICD-10-CM | POA: Diagnosis not present

## 2024-02-01 DIAGNOSIS — E1151 Type 2 diabetes mellitus with diabetic peripheral angiopathy without gangrene: Secondary | ICD-10-CM | POA: Diagnosis not present

## 2024-02-01 DIAGNOSIS — Z89511 Acquired absence of right leg below knee: Secondary | ICD-10-CM | POA: Diagnosis not present

## 2024-02-01 DIAGNOSIS — E1122 Type 2 diabetes mellitus with diabetic chronic kidney disease: Secondary | ICD-10-CM | POA: Diagnosis not present

## 2024-02-01 DIAGNOSIS — A0472 Enterocolitis due to Clostridium difficile, not specified as recurrent: Secondary | ICD-10-CM | POA: Diagnosis not present

## 2024-02-01 DIAGNOSIS — E872 Acidosis, unspecified: Secondary | ICD-10-CM | POA: Diagnosis not present

## 2024-02-01 DIAGNOSIS — I120 Hypertensive chronic kidney disease with stage 5 chronic kidney disease or end stage renal disease: Secondary | ICD-10-CM | POA: Diagnosis not present

## 2024-02-01 DIAGNOSIS — K760 Fatty (change of) liver, not elsewhere classified: Secondary | ICD-10-CM | POA: Diagnosis not present

## 2024-02-01 DIAGNOSIS — L89151 Pressure ulcer of sacral region, stage 1: Secondary | ICD-10-CM | POA: Diagnosis not present

## 2024-02-01 DIAGNOSIS — I480 Paroxysmal atrial fibrillation: Secondary | ICD-10-CM | POA: Diagnosis not present

## 2024-02-01 DIAGNOSIS — Z794 Long term (current) use of insulin: Secondary | ICD-10-CM | POA: Diagnosis not present

## 2024-02-01 DIAGNOSIS — N2581 Secondary hyperparathyroidism of renal origin: Secondary | ICD-10-CM | POA: Diagnosis not present

## 2024-02-01 DIAGNOSIS — R197 Diarrhea, unspecified: Secondary | ICD-10-CM | POA: Diagnosis not present

## 2024-02-01 DIAGNOSIS — E8779 Other fluid overload: Secondary | ICD-10-CM | POA: Diagnosis not present

## 2024-02-01 DIAGNOSIS — G9341 Metabolic encephalopathy: Secondary | ICD-10-CM | POA: Diagnosis not present

## 2024-02-01 DIAGNOSIS — N186 End stage renal disease: Secondary | ICD-10-CM | POA: Diagnosis not present

## 2024-02-01 DIAGNOSIS — L89891 Pressure ulcer of other site, stage 1: Secondary | ICD-10-CM | POA: Diagnosis not present

## 2024-02-01 DIAGNOSIS — Z89512 Acquired absence of left leg below knee: Secondary | ICD-10-CM | POA: Diagnosis not present

## 2024-02-01 DIAGNOSIS — D631 Anemia in chronic kidney disease: Secondary | ICD-10-CM | POA: Diagnosis not present

## 2024-02-01 DIAGNOSIS — G2581 Restless legs syndrome: Secondary | ICD-10-CM | POA: Diagnosis not present

## 2024-02-01 DIAGNOSIS — G253 Myoclonus: Secondary | ICD-10-CM | POA: Diagnosis not present

## 2024-02-01 DIAGNOSIS — A0471 Enterocolitis due to Clostridium difficile, recurrent: Secondary | ICD-10-CM | POA: Diagnosis not present

## 2024-02-01 DIAGNOSIS — Z7401 Bed confinement status: Secondary | ICD-10-CM | POA: Diagnosis not present

## 2024-02-01 DIAGNOSIS — R531 Weakness: Secondary | ICD-10-CM | POA: Diagnosis not present

## 2024-02-01 DIAGNOSIS — J449 Chronic obstructive pulmonary disease, unspecified: Secondary | ICD-10-CM | POA: Diagnosis not present

## 2024-02-01 LAB — GASTROINTESTINAL PANEL BY PCR, STOOL (REPLACES STOOL CULTURE)

## 2024-02-01 LAB — RENAL FUNCTION PANEL
Albumin: 2.5 g/dL — ABNORMAL LOW (ref 3.5–5.0)
Anion gap: 22 — ABNORMAL HIGH (ref 5–15)
BUN: 104 mg/dL — ABNORMAL HIGH (ref 8–23)
CO2: 14 mmol/L — ABNORMAL LOW (ref 22–32)
Calcium: 6.8 mg/dL — ABNORMAL LOW (ref 8.9–10.3)
Chloride: 102 mmol/L (ref 98–111)
Creatinine, Ser: 12.57 mg/dL — ABNORMAL HIGH (ref 0.61–1.24)
GFR, Estimated: 4 mL/min — ABNORMAL LOW (ref >60)
Glucose, Bld: 106 mg/dL — ABNORMAL HIGH (ref 70–99)
Phosphorus: 10.9 mg/dL — ABNORMAL HIGH (ref 2.5–4.6)
Potassium: 4.6 mmol/L (ref 3.5–5.1)
Sodium: 138 mmol/L (ref 135–145)

## 2024-02-01 LAB — CBC
HCT: 32 % — ABNORMAL LOW (ref 39.0–52.0)
Hemoglobin: 10.5 g/dL — ABNORMAL LOW (ref 13.0–17.0)
MCH: 29.5 pg (ref 26.0–34.0)
MCHC: 32.8 g/dL (ref 30.0–36.0)
MCV: 89.9 fL (ref 80.0–100.0)
Platelets: 162 10*3/uL (ref 150–400)
RBC: 3.56 MIL/uL — ABNORMAL LOW (ref 4.22–5.81)
RDW: 15.8 % — ABNORMAL HIGH (ref 11.5–15.5)
WBC: 11.5 10*3/uL — ABNORMAL HIGH (ref 4.0–10.5)
nRBC: 0 % (ref 0.0–0.2)

## 2024-02-01 LAB — HEMOGLOBIN A1C
Hgb A1c MFr Bld: 5.4 % (ref 4.8–5.6)
Mean Plasma Glucose: 108.28 mg/dL

## 2024-02-01 LAB — C DIFFICILE QUICK SCREEN W PCR REFLEX
C Diff antigen: POSITIVE — AB
C Diff interpretation: DETECTED
C Diff toxin: POSITIVE — AB

## 2024-02-01 LAB — GLUCOSE, CAPILLARY
Glucose-Capillary: 112 mg/dL — ABNORMAL HIGH (ref 70–99)
Glucose-Capillary: 97 mg/dL (ref 70–99)

## 2024-02-01 LAB — HEPATITIS B SURFACE ANTIGEN: Hepatitis B Surface Ag: NONREACTIVE

## 2024-02-01 LAB — LACTIC ACID, PLASMA: Lactic Acid, Venous: 1 mmol/L (ref 0.5–1.9)

## 2024-02-01 MED ORDER — VANCOMYCIN HCL 125 MG PO CAPS: 125.0000 mg | ORAL_CAPSULE | Freq: Four times a day (QID) | ORAL | Status: DC

## 2024-02-01 MED ORDER — INSULIN ASPART 100 UNIT/ML IJ SOLN
0.0000 [IU] | Freq: Three times a day (TID) | INTRAMUSCULAR | Status: DC
  Administered 2024-02-02 – 2024-02-04 (×5): 1 [IU] via SUBCUTANEOUS

## 2024-02-01 MED ORDER — ACETAMINOPHEN 325 MG PO TABS
ORAL_TABLET | ORAL | Status: AC
  Filled 2024-02-01: qty 2

## 2024-02-01 MED ORDER — CARVEDILOL 3.125 MG PO TABS: 3.1250 mg | ORAL_TABLET | Freq: Two times a day (BID) | ORAL | Status: DC

## 2024-02-01 MED ORDER — FAMOTIDINE 20 MG PO TABS
20.0000 mg | ORAL_TABLET | ORAL | Status: DC
  Administered 2024-02-01 – 2024-02-03 (×2): 20 mg via ORAL
  Filled 2024-02-01 (×4): qty 1

## 2024-02-01 MED ORDER — ACETAMINOPHEN 325 MG PO TABS
650.0000 mg | ORAL_TABLET | Freq: Four times a day (QID) | ORAL | Status: DC | PRN
  Administered 2024-02-01 – 2024-02-02 (×3): 650 mg via ORAL
  Filled 2024-02-01 (×2): qty 2

## 2024-02-01 MED ORDER — SEVELAMER CARBONATE 800 MG PO TABS
1600.0000 mg | ORAL_TABLET | Freq: Three times a day (TID) | ORAL | Status: DC
  Administered 2024-02-02 – 2024-02-04 (×7): 1600 mg via ORAL
  Filled 2024-02-01 (×7): qty 2

## 2024-02-01 MED ORDER — MAGNESIUM OXIDE -MG SUPPLEMENT 400 (240 MG) MG PO TABS
400.0000 mg | ORAL_TABLET | Freq: Every day | ORAL | Status: DC
  Administered 2024-02-01 – 2024-02-04 (×4): 400 mg via ORAL
  Filled 2024-02-01 (×4): qty 1

## 2024-02-01 MED ORDER — APIXABAN 5 MG PO TABS
5.0000 mg | ORAL_TABLET | Freq: Two times a day (BID) | ORAL | Status: DC
Start: 2024-02-01 — End: 2024-02-04
  Administered 2024-02-01 – 2024-02-04 (×7): 5 mg via ORAL
  Filled 2024-02-01 (×7): qty 1

## 2024-02-01 MED ORDER — PENTAFLUOROPROP-TETRAFLUOROETH EX AERO: 1.0000 | INHALATION_SPRAY | CUTANEOUS | Status: DC | PRN

## 2024-02-01 MED ORDER — HEPARIN SODIUM (PORCINE) 1000 UNIT/ML DIALYSIS: 1000.0000 [IU] | INTRAMUSCULAR | Status: DC | PRN

## 2024-02-01 MED ORDER — FOLIC ACID 1 MG PO TABS
1.0000 mg | ORAL_TABLET | Freq: Every morning | ORAL | Status: DC
  Administered 2024-02-01 – 2024-02-04 (×4): 1 mg via ORAL
  Filled 2024-02-01 (×6): qty 1

## 2024-02-01 MED ORDER — TAMSULOSIN HCL 0.4 MG PO CAPS
0.4000 mg | ORAL_CAPSULE | Freq: Every morning | ORAL | Status: DC
  Administered 2024-02-01 – 2024-02-04 (×4): 0.4 mg via ORAL
  Filled 2024-02-01 (×5): qty 1

## 2024-02-01 MED ORDER — DOXERCALCIFEROL 4 MCG/2ML IV SOLN
INTRAVENOUS | Status: AC
  Filled 2024-02-01: qty 2

## 2024-02-01 MED ORDER — CHLORHEXIDINE GLUCONATE CLOTH 2 % EX PADS
6.0000 | MEDICATED_PAD | Freq: Every day | CUTANEOUS | Status: DC
  Administered 2024-02-01 – 2024-02-04 (×4): 6 via TOPICAL

## 2024-02-01 MED ORDER — CLOPIDOGREL BISULFATE 75 MG PO TABS
75.0000 mg | ORAL_TABLET | Freq: Every day | ORAL | Status: DC
  Administered 2024-02-02 – 2024-02-04 (×3): 75 mg via ORAL
  Filled 2024-02-01 (×3): qty 1

## 2024-02-01 MED ORDER — LIDOCAINE-PRILOCAINE 2.5-2.5 % EX CREA: 1.0000 | TOPICAL_CREAM | CUTANEOUS | Status: DC | PRN

## 2024-02-01 MED ORDER — INSULIN ASPART 100 UNIT/ML IJ SOLN: 0.0000 [IU] | Freq: Every day | INTRAMUSCULAR | Status: DC

## 2024-02-01 MED ORDER — FENTANYL 50 MCG/HR TD PT72
1.0000 | MEDICATED_PATCH | TRANSDERMAL | Status: DC
  Administered 2024-02-01 – 2024-02-04 (×2): 1 via TRANSDERMAL
  Filled 2024-02-01 (×2): qty 1

## 2024-02-01 MED ORDER — VANCOMYCIN HCL 125 MG PO CAPS
125.0000 mg | ORAL_CAPSULE | Freq: Four times a day (QID) | ORAL | Status: DC
  Administered 2024-02-01 – 2024-02-04 (×13): 125 mg via ORAL
  Filled 2024-02-01 (×17): qty 1

## 2024-02-01 MED ORDER — CARVEDILOL 3.125 MG PO TABS
3.1250 mg | ORAL_TABLET | ORAL | Status: DC
  Administered 2024-02-02 – 2024-02-03 (×4): 3.125 mg via ORAL
  Filled 2024-02-01 (×5): qty 1

## 2024-02-01 MED ORDER — DOXERCALCIFEROL 4 MCG/2ML IV SOLN
2.0000 ug | INTRAVENOUS | Status: DC
  Administered 2024-02-01: 2 ug via INTRAVENOUS
  Filled 2024-02-01 (×2): qty 2

## 2024-02-01 MED ORDER — MIDODRINE HCL 5 MG PO TABS
10.0000 mg | ORAL_TABLET | Freq: Once | ORAL | Status: AC
  Administered 2024-02-01: 10 mg via ORAL
  Filled 2024-02-01: qty 2

## 2024-02-01 MED ORDER — ATORVASTATIN CALCIUM 80 MG PO TABS
80.0000 mg | ORAL_TABLET | Freq: Every day | ORAL | Status: DC
  Administered 2024-02-01 – 2024-02-03 (×3): 80 mg via ORAL
  Filled 2024-02-01 (×3): qty 1

## 2024-02-01 MED ORDER — ACETAMINOPHEN 650 MG RE SUPP: 650.0000 mg | Freq: Four times a day (QID) | RECTAL | Status: DC | PRN

## 2024-02-01 MED ORDER — ANTICOAGULANT SODIUM CITRATE 4% (200MG/5ML) IV SOLN: 5.0000 mL | Status: DC | PRN

## 2024-02-01 MED ORDER — LIDOCAINE HCL (PF) 1 % IJ SOLN: 5.0000 mL | INTRAMUSCULAR | Status: DC | PRN

## 2024-02-01 MED ORDER — ALTEPLASE 2 MG IJ SOLR: 2.0000 mg | Freq: Once | INTRAMUSCULAR | Status: DC | PRN

## 2024-02-01 MED ORDER — CARVEDILOL 3.125 MG PO TABS
3.1250 mg | ORAL_TABLET | ORAL | Status: DC
  Administered 2024-02-04: 3.125 mg via ORAL

## 2024-02-01 NOTE — Consult Note (Addendum)
 [facility] Renal Consultation Note    Indication for Consultation:  Management of ESRD/hemodialysis; anemia, hypertension/volume and secondary hyperparathyroidism  ZOX:WRUEAV, Elana Grayer, PA-C  HPI: [name] is a 68 y.o. male with ESRD on HD TTS at [facility]. His past medical history is significant for CAD s/p recent PCI, hx c diff colitis in January 2025, bilateral BKA, HTN, and dyslipidemia, He presents to the ED from his facility c/o ongoing diarrhea, abdominal pain, and AMS. He was found to have a fever. CT showed diffuse proctocolitis concerning for possible C-diff. He is admitted.  Seen and examined patient at bedside. He's currently alert/oriented and endorses ongoing diarrhea. His last HD was on 5/15 (full treatment) so he's missed total 3 treatments. He's remains on RA. Recent labs include: Cr 12.57, K+ 4.6, Ca 6.8, phos 10.2, Hgb 9.5, lactic acid 1, and Hgb 10.5. He denies SOB, CP, palpitations, and N/V. Plan for HD today per his usual schedule.  Past Medical History:  Diagnosis Date   Carotid artery occlusion 11/10/2010   LEFT CAROTID ENDARTERECTOMY   Complication of anesthesia    BP WENT UP AT [facility] "   COPD (chronic obstructive pulmonary disease) (HCC)    pt denies this dx as of 06/01/20 - no inhaler    Diabetes mellitus without complication (HCC)    Diverticulitis    Diverticulosis of colon (without mention of hemorrhage)    DJD (degenerative joint disease)    knees/hands/feet/back/neck   ESRD (end stage renal disease) on dialysis (HCC)    Fatty liver    Full dentures    GERD (gastroesophageal reflux disease)    H/O hiatal hernia    History of blood transfusion    with a past surical procedure per patient 06/01/20   Hyperlipidemia    Hypertension    Neuromuscular disorder (HCC)    peripheral neuropathy   Non-pressure chronic ulcer of other part of left foot limited to breakdown of skin (HCC) 11/12/2016   Osteomyelitis (HCC)    left  5th metatarsal   PAD (peripheral artery disease) (HCC)    Distal aortogram June 2012. Atherectomy left popliteal artery July 2012.    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   Unstable angina (HCC) 09/16/2018   Wears glasses    Past Surgical History:  Procedure Laterality Date   ABDOMINAL AORTOGRAM W/LOWER EXTREMITY N/A 06/23/2021   Procedure: ABDOMINAL AORTOGRAM W/LOWER EXTREMITY;  Surgeon: Cody Das, MD;  [facility]: [facility] INVASIVE CV LAB;  Service: Cardiovascular;  Laterality: N/A;   AMPUTATION  11/05/2011   Procedure: AMPUTATION RAY;  Surgeon: Amada Backer, MD;  Location: [facility] OR;  Service: Orthopedics;  Laterality: Right;  Amputation of Right 4&5th Toes   AMPUTATION Left 11/26/2012   Procedure: AMPUTATION RAY;  Surgeon: Amada Backer, MD;  Location: [facility] OR;  Service: Orthopedics;  Laterality: Left;  fourth ray amputation   AMPUTATION Right 08/27/2014   Procedure: Transmetatarsal Amputation;  Surgeon: Timothy Ford, MD;  Location: [facility] OR;  Service: Orthopedics;  Laterality: Right;   AMPUTATION Right 01/14/2015   Procedure: AMPUTATION BELOW KNEE;  Surgeon: Timothy Ford, MD;  Location: [facility] OR;  Service: Orthopedics;  Laterality: Right;   AMPUTATION Left 10/21/2015   Procedure: Left Foot 5th Ray Amputation;  Surgeon: Timothy Ford, MD;  Location: [facility] OR;  Service:  Orthopedics;  Laterality: Left;   AMPUTATION Left 07/04/2022   Procedure: LEFT BELOW KNEE AMPUTATION;  Surgeon: Timothy Ford, MD;  Location: [facility] OR;  Service: Orthopedics;  Laterality: Left;   ANTERIOR FUSION CERVICAL SPINE  02/06/06   C4-5, C5-6, C6-7; SURGEON DR. MAX COHEN   AV FISTULA PLACEMENT Left 06/02/2020   Procedure: ARTERIOVENOUS (AV) FISTULA CREATION LEFT;  Surgeon: Adine Hoof, MD;  Location: [facility] OR;  Service: Vascular;  Laterality: Left;   BACK SURGERY     x 3   BASCILIC VEIN  TRANSPOSITION Left 07/21/2020   Procedure: LEFT UPPER ARM ATERIOVENOUS SUPERFISTULALIZATION;  Surgeon: Adine Hoof, MD;  Location: [facility] OR;  Service: Vascular;  Laterality: Left;   BELOW KNEE LEG AMPUTATION Right    CARDIAC CATHETERIZATION  10/31/04   2009   CAROTID ENDARTERECTOMY  11/10/10   CAROTID ENDARTERECTOMY Left 11/10/2010   Subtotal occlusion of left internal carotid artery with left hemispheric transient ischemic attacks.   CAROTID STENT     CARPAL TUNNEL RELEASE Right 10/21/2013   Procedure: RIGHT CARPAL TUNNEL RELEASE;  Surgeon: Kemp Patter, MD;  Location: [facility];  Service: Orthopedics;  Laterality: Right;   CHOLECYSTECTOMY     COLON SURGERY     COLONOSCOPY     COLOSTOMY REVERSAL  05/21/2018   ileostomy reversal   CORONARY LITHOTRIPSY N/A 11/24/2023   Procedure: CORONARY LITHOTRIPSY;  Surgeon: Swaziland, Peter M, MD;  Location: [facility] INVASIVE CV LAB;  Service: Cardiovascular;  Laterality: N/A;   CORONARY STENT INTERVENTION N/A 11/24/2023   Procedure: CORONARY STENT INTERVENTION;  Surgeon: Swaziland, Peter M, MD;  Location: [facility] INVASIVE CV LAB;  Service: Cardiovascular;  Laterality: N/A;   CYSTOSCOPY WITH STENT PLACEMENT Bilateral 01/13/2018   Procedure: CYSTOSCOPY WITH BILATERAL URETERAL CATHETER PLACEMENT;  Surgeon: Andrez Banker, MD;  Location: [facility] ORS;  Service: Urology;  Laterality: Bilateral;   ESOPHAGEAL MANOMETRY Bilateral 07/19/2014   Procedure: ESOPHAGEAL MANOMETRY (EM);  Surgeon: Nannette Babe, MD;  Location: [facility] ENDOSCOPY;  Service: Gastroenterology;  Laterality: Bilateral;   EYE SURGERY Bilateral 2020   cataract   FEMORAL ARTERY STENT     x6   FINGER SURGERY     FOOT SURGERY  04/25/2016    EXCISION BASE 5TH METATARSAL AND PARTIAL CUBOID LEFT FOOT   HERNIA REPAIR     LEFT INGUINAL AND UMBILICAL REPAIRS   HERNIA REPAIR     I & D EXTREMITY Left 04/25/2016   Procedure: EXCISION BASE 5TH METATARSAL AND PARTIAL CUBOID LEFT FOOT;  Surgeon: Timothy Ford, MD;  Location: [facility] OR;  Service: Orthopedics;  Laterality: Left;   ILEOSTOMY  01/13/2018   Procedure: ILEOSTOMY;  Surgeon: Adalberto Acton, MD;  Location: [facility] ORS;  Service: General;;   ILEOSTOMY CLOSURE N/A 05/21/2018   Procedure: ILEOSTOMY REVERSAL ERAS PATHWAY;  Surgeon: Adalberto Acton, MD;  Location: [facility] OR;  Service: General;  Laterality: N/A;   IR RADIOLOGIST EVAL & MGMT  11/19/2017   IR RADIOLOGIST EVAL & MGMT  12/03/2017   IR RADIOLOGIST EVAL & MGMT  12/18/2017   JOINT REPLACEMENT Right 2001   Total knee   LAMINECTOMY     X 3 LUMBAR AND X 2 CERVICAL SPINE OPERATIONS   LAPAROSCOPIC CHOLECYSTECTOMY W/ CHOLANGIOGRAPHY  11/09/04   SURGEON DR. Annetta Barter   LEFT HEART CATH AND CORONARY ANGIOGRAPHY N/A 09/16/2018   Procedure: LEFT HEART CATH AND CORONARY ANGIOGRAPHY;  Surgeon: Cody Das, MD;  [facility];  Service: Cardiovascular;  Laterality: N/A;   LEFT HEART CATH AND CORONARY ANGIOGRAPHY N/A 11/24/2023   Procedure: LEFT HEART CATH AND CORONARY ANGIOGRAPHY;  Surgeon: Swaziland, Peter M, MD;  Location: [facility] INVASIVE CV LAB;  Service: Cardiovascular;  Laterality: N/A;   LEFT HEART CATHETERIZATION WITH CORONARY ANGIOGRAM N/A 10/29/2014   Procedure: LEFT HEART CATHETERIZATION WITH CORONARY ANGIOGRAM;  Surgeon: Jessica Morn, MD;  Location: [facility] CATH LAB;  Service: Cardiovascular;  Laterality: N/A;   LIGATION OF COMPETING BRANCHES OF ARTERIOVENOUS FISTULA Left 07/21/2020   Procedure: LIGATION OF COMPETING BRANCHES OF LEFT UPPER ARM ARTERIOVENOUS FISTULA;  Surgeon: Adine Hoof, MD;  Location: [facility] OR;  Service: Vascular;  Laterality: Left;   LOWER EXTREMITY ANGIOGRAM N/A 03/19/2012   Procedure: LOWER EXTREMITY ANGIOGRAM;  Surgeon: Odie Benne, MD;  Location: [facility] CATH LAB;  Service: Cardiovascular;  Laterality: N/A;   LOWER EXTREMITY ANGIOGRAPHY N/A 06/20/2021   Procedure: LOWER EXTREMITY ANGIOGRAPHY;  Surgeon: Cody Das, MD;  [facility]: [facility]  INVASIVE CV LAB;  Service: Cardiovascular;  Laterality: N/A;   NECK SURGERY     PARTIAL COLECTOMY N/A 01/13/2018   Procedure: LAPAROSCOPIC ASSISTED   SIGMOID COLECTOMY ILEOSTOMY;  Surgeon: Adalberto Acton, MD;  Location: [facility] ORS;  Service: General;  Laterality: N/A;   PENILE PROSTHESIS IMPLANT  08/14/05   INFRAPUBIC INSERTION OF INFLATABLE PENILE PROSTHESIS; SURGEON DR. Luster Salters   PENILE PROSTHESIS IMPLANT     PERCUTANEOUS CORONARY STENT INTERVENTION (PCI-S) Right 10/29/2014   Procedure: PERCUTANEOUS CORONARY STENT INTERVENTION (PCI-S);  Surgeon: Jessica Morn, MD;  Location: [facility] CATH LAB;  Service: Cardiovascular;  Laterality: Right;   PERIPHERAL VASCULAR INTERVENTION Left 06/23/2021   Procedure: PERIPHERAL VASCULAR INTERVENTION;  Surgeon: Cody Das, MD;  [facility] INV[facility] CV LAB;  Service: Cardiovascular;  Laterality: Left;   REMOVAL OF PENILE PROSTHESIS N/A 06/14/2021   Procedure: Removal of THREE piece inflatable penile prosthesis;  Surgeon: Samson Croak, MD;  Location: [facility] OR;  Service: Urology;  Laterality: N/A;   SHOULDER ARTHROSCOPY     SPINE SURGERY     TOE AMPUTATION Left    TONSILLECTOMY     TOTAL KNEE ARTHROPLASTY  07/2002   RIGHT KNEE ; SURGEON  DR. [name] ALSO HAD ARTHROSCOPIC RIGHT KNEE IN  10/2001   TOTAL KNEE ARTHROPLASTY     ULNAR NERVE TRANSPOSITION Right 10/21/2013   Procedure: RIGHT ELBOW  ULNAR NERVE DECOMPRESSION;  Surgeon: Kemp Patter, MD;  Location: [facility];  Service: Orthopedics;  Laterality: Right;   Family History  Problem Relation Age of Onset   Heart disease Father        Before age 30-  CAD, BPG   Diabetes Father        Amputation   Cancer Father        PROSTATE   Hyperlipidemia Father    Hypertension Father    Heart attack Father        Triple BPG   Varicose Veins Father    Cancer Sister        Breast   Hyperlipidemia Sister    Hypertension Sister    Heart attack Brother    Colon cancer Brother    Diabetes  Brother    Heart disease Brother 51       A-Fib. Before age 33   Hyperlipidemia Brother    Hypertension Brother    Hypertension Son    Arthritis Other        GRANDMOTHER   Hypertension Other  OTHER FAMILY MEMBERS   Social History:  reports that he has been smoking cigarettes. He has a 17.5 pack-year smoking history. He has never used smokeless tobacco. He reports that he does not currently use alcohol . He reports that he does not use drugs. Allergies  Allergen Reactions   Contrast Media [Iodinated Contrast Media] Shortness Of Breath and Other (See Comments)    Difficulty breathing and altered mental status     Ivp Dye [Iodinated Contrast Media] Anaphylaxis, Shortness Of Breath and Other (See Comments)    Breathing problems, altered mental state    Latex Rash and Other (See Comments)    A severe rash appears after the first 24 hours of being placed   Tape Other (See Comments) and Rash    Rash after 1 day of use  Do not leave on longer then 3 days with out be changed   Prior to Admission medications   Medication Sig Start Date End Date Taking? Authorizing Provider  amiodarone  (PACERONE ) 200 MG tablet Take 0.5 tablets (100 mg total) by mouth daily. 01/01/24  Yes Knox Perl, MD  apixaban  (ELIQUIS ) 5 MG TABS tablet Take 1 tablet (5 mg total) by mouth 2 (two) times daily. 01/14/22 03/09/24 Yes Gonfa, Taye T, MD  atorvastatin  (LIPITOR) 80 MG tablet Take 1 tablet (80 mg total) by mouth at bedtime. 11/25/23  Yes Amoako, Prince, MD  B Complex-C-Zn-Folic Acid  (DIALYVITE /ZINC ) TABS Take 1 tablet by mouth Every Tuesday,Thursday,and Saturday with dialysis. 10/04/21  Yes [provider]  carvedilol  (COREG ) 3.125 MG tablet Take 1 tablet (3.125 mg total) by mouth 2 (two) times daily with a meal. Patient taking differently: Take 3.125 mg by mouth See admin instructions. Take one tablet by mouth at bedtime on Tuesday, Thursday, Saturday. Take one tablet two times a day every Monday,  Wednesday, Friday, Sunday. Hold if SBP less than 110 01/01/24  Yes Knox Perl, MD  clopidogrel  (PLAVIX ) 75 MG tablet Take 1 tablet (75 mg total) by mouth daily with breakfast. 11/25/23 11/24/24 Yes Amoako, Prince, MD  famotidine  (PEPCID ) 20 MG tablet Take 20 mg by mouth every other day.   Yes [provider]  fentaNYL  (DURAGESIC ) 50 MCG/HR Place 1 patch onto the skin every 3 (three) days. 07/25/23  Yes [provider]  folic acid  (FOLVITE ) 1 MG tablet Take 1 mg by mouth in the morning. 09/18/21  Yes [provider]  gabapentin  (NEURONTIN ) 100 MG capsule Take 100 mg by mouth 3 (three) times daily.   Yes [provider]  insulin  aspart (NOVOLOG  FLEXPEN) 100 UNIT/ML FlexPen Inject 0-12 Units into the skin See admin instructions. Inject 4 units into the skin with meals in addition to sliding scale as needed. Inject as per sliding scale: If 0 - 69 = 0 initiate hypoglycemia orders;  70 - 150 = 0;  151 - 200 = 2;  201 - 250 = 4;  251 - 300 = 6;  301 - 350 = 8;  351 - 400 = 10;  401 - 450 = 12.  If >400, give 12 units, recheck in 1 hour. If still >400, call MD/NP for further orders. Subcutaneously with meals in addition to scheduled 4 untes with meals.   Yes [provider]  lidocaine -prilocaine  (EMLA ) cream Apply 1 application topically See admin instructions. Prior to Dialysis days Tuesday,Thursday and saturday 06/06/21  Yes [provider]  loratadine  (CLARITIN ) 10 MG tablet Take 10 mg by mouth in the morning.   Yes [provider]  midodrine  (PROAMATINE ) 10 MG tablet Take 10 mg by mouth daily as needed (SBP less than 100.).   Yes [provider]  mineral oil-hydrophilic petrolatum (AQUAPHOR) ointment Apply 1 Application topically as needed for dry skin or irritation (Upper thigh).   Yes [provider]  nitroGLYCERIN  (NITROSTAT ) 0.4 MG SL tablet Place 0.4 mg under the tongue every 5 (five) minutes as needed for chest pain.  01/06/22  Yes [provider]  oxyCODONE -acetaminophen  (PERCOCET /ROXICET) 5-325 MG tablet Take 1 tablet by mouth every 8 (eight) hours as needed for severe pain. 05/31/23  Yes Ghimire, Estil Heman, MD  promethazine  (PHENERGAN ) 12.5 MG suppository Place 12.5 mg rectally daily as needed for nausea or vomiting.   Yes [provider]  rOPINIRole  (REQUIP ) 2 MG tablet Take 2 mg by mouth at bedtime.   Yes [provider]  tamsulosin  (FLOMAX ) 0.4 MG CAPS capsule Take 0.4 mg by mouth in the morning.   Yes [provider]  amLODipine  (NORVASC ) 10 MG tablet Take 10 mg by mouth in the morning. Patient not taking: Reported on 01/01/2024    [provider]  hydrALAZINE  (APRESOLINE ) 50 MG tablet Take 50 mg by mouth every 8 (eight) hours. Patient not taking: Reported on 02/01/2024    [provider]  Icosapent  Ethyl 0.5 g CAPS Take 1 capsule by mouth in the morning. Patient not taking: Reported on 02/01/2024    [provider]  isosorbide  mononitrate (IMDUR ) 60 MG 24 hr tablet Take 1 tablet (60 mg total) by mouth daily. Patient not taking: Reported on 12/02/2023 01/21/23 01/08/24  Knox Perl, MD  liver oil-zinc  oxide (DESITIN) 40 % ointment Apply 1 Application topically as needed for irritation. Patient not taking: Reported on 02/01/2024    [provider]   Current Facility-Administered Medications  Medication Dose Route Frequency Provider Last Rate Last Admin   acetaminophen  (TYLENOL ) tablet 650 mg  650 mg Oral Q6H PRN Etter Hermann., MD   650 mg at 02/01/24 8295   Or   acetaminophen  (TYLENOL ) suppository 650 mg  650 mg Rectal Q6H PRN Etter Hermann., MD       alteplase  (CATHFLO ACTIVASE ) injection 2 mg  2 mg Intracatheter Once PRN Tiaja Hagan E, NP       anticoagulant sodium citrate  solution 5 mL  5 mL Intracatheter PRN Collins, Samantha G, PA-C       apixaban  (ELIQUIS ) tablet 5 mg  5 mg Oral BID Etter Hermann., MD    5 mg at 02/01/24 1234   atorvastatin  (LIPITOR) tablet 80 mg  80 mg Oral QHS Etter Hermann., MD       carvedilol  (COREG ) tablet 3.125 mg  3.125 mg Oral BID WC Etter Hermann., MD       Chlorhexidine  Gluconate Cloth 2 % PADS 6 each  6 each Topical Q0600 Kitty Perkins, NP   6 each at 02/01/24 1259   [START ON 02/02/2024] clopidogrel  (PLAVIX ) tablet 75 mg  75 mg Oral Q breakfast Etter Hermann., MD       famotidine  (PEPCID ) tablet 20 mg  20 mg Oral QODAY Powell, A Caldwell Jr., MD       fentaNYL  (DURAGESIC ) 50 MCG/HR 1 patch  1 patch Transdermal Q72H Etter Hermann., MD       folic acid  (FOLVITE ) tablet 1 mg  1 mg Oral q AM Etter Hermann., MD       heparin   injection 1,000 Units  1,000 Units Dialysis PRN Kitty Perkins, NP       insulin  aspart (novoLOG ) injection 0-5 Units  0-5 Units Subcutaneous QHS Etter Hermann., MD       insulin  aspart (novoLOG ) injection 0-6 Units  0-6 Units Subcutaneous TID WC Etter Hermann., MD       lidocaine  (PF) (XYLOCAINE ) 1 % injection 5 mL  5 mL Intradermal PRN Kitty Perkins, NP       lidocaine -prilocaine  (EMLA ) cream 1 Application  1 Application Topical PRN Kitty Perkins, NP       magnesium  oxide (MAG-OX) tablet 400 mg  400 mg Oral Daily Etter Hermann., MD   400 mg at 02/01/24 4401   pentafluoroprop-tetrafluoroeth (GEBAUERS) aerosol 1 Application  1 Application Topical PRN Kitty Perkins, NP       tamsulosin  (FLOMAX ) capsule 0.4 mg  0.4 mg Oral q AM Etter Hermann., MD       vancomycin  (VANCOCIN ) capsule 125 mg  125 mg Oral QID Etter Hermann., MD   125 mg at 02/01/24 0272   Labs: Basic Metabolic Panel: Recent Labs  Lab 01/31/24 1822 02/01/24 1119  NA 138 138  K 3.8 4.6  CL 106 102  CO2 15* 14*  GLUCOSE 160* 106*  BUN 89* 104*  CREATININE 10.65* 12.57*  CALCIUM  5.8* 6.8*  PHOS 9.2* 10.9*   Liver Function Tests: Recent Labs  Lab 01/31/24 1822  02/01/24 1119  AST 17  --   ALT 11  --   ALKPHOS 88  --   BILITOT 0.6  --   PROT 4.6*  --   ALBUMIN  2.3* 2.5*   Recent Labs  Lab 01/31/24 1822  LIPASE 20   No results for input(s): "AMMONIA" in the last 168 hours. CBC: Recent Labs  Lab 01/31/24 1822 02/01/24 1119  WBC 10.1 11.5*  NEUTROABS 8.2*  --   HGB 9.5* 10.5*  HCT 29.4* 32.0*  MCV 92.2 89.9  PLT 154 162   Cardiac Enzymes: No results for input(s): "CKTOTAL", "CKMB", "CKMBINDEX", "TROPONINI" in the last 168 hours. CBG: Recent Labs  Lab 02/01/24 1155  GLUCAP 112*   Iron Studies: No results for input(s): "IRON", "TIBC", "TRANSFERRIN", "FERRITIN" in the last 72 hours. Studies/Results: CT ABDOMEN PELVIS WO CONTRAST Result Date: 01/31/2024 CLINICAL DATA:  Right upper quadrant pain diarrhea EXAM: CT ABDOMEN AND PELVIS WITHOUT CONTRAST TECHNIQUE: Multidetector CT imaging of the abdomen and pelvis was performed following the standard protocol without IV contrast. RADIATION DOSE REDUCTION: This exam was performed according to the departmental dose-optimization program which includes automated exposure control, adjustment of the mA and/or kV according to patient size and/or use of iterative reconstruction technique. COMPARISON:  CT 07/18/2023 FINDINGS: Lower chest: Lung bases demonstrate mild dependent atelectasis. Trace pleural effusions. Cardiomegaly with coronary vascular calcification. Aortic valvular calcification. Mitral calcification. Hepatobiliary: Cholecystectomy. No focal hepatic abnormality or biliary dilatation. Pancreas: No inflammation. Atrophic. Stable cystic lesion at the body of the pancreas measuring about 2.0 by 1.8 cm on series 3, image 31. Spleen: Normal in size without focal abnormality. Adrenals/Urinary Tract: Adrenal glands are normal. Atrophic kidneys without hydronephrosis. Urinary bladder is slightly thick walled with mild perivesical stranding Stomach/Bowel: Stomach nonenlarged. No dilated small bowel.  Diffuse colon wall thickening with mild pericolonic stranding at the ascending colon and descending and rectosigmoid colon. Postsurgical changes at the sigmoid colon. Circumferential wall thickening at the rectum. Vascular/Lymphatic: Advanced aortic atherosclerosis without aneurysm.  Multiple small retroperitoneal lymph nodes, all subcentimeter. Reproductive: Negative prostate Other: Negative for pelvic effusion or free air. Some perirectal stranding. Musculoskeletal: Spinal fusion hardware L4 through S1. No acute osseous abnormality IMPRESSION: 1. Findings consistent with diffuse proctocolitis likely infectious (? C diff) or inflammatory. No evidence for perforation. 2. Mild bladder wall thickening with perivesical stranding, correlate with urinalysis to exclude cystitis. 3. Cardiomegaly with trace pleural effusions. 4. Atrophic kidneys. 5. Stable small cystic pancreatic lesion. 6. Aortic atherosclerosis. Aortic Atherosclerosis (ICD10-I70.0). Electronically Signed   By: Esmeralda Hedge M.D.   On: 01/31/2024 21:05    ROS: All others negative except those listed in HPI.   Physical Exam: Vitals:   02/01/24 0955 02/01/24 1000 02/01/24 1019 02/01/24 1052  BP: 133/60 128/65  130/74  Pulse:    72  Resp: 16 18  18   Temp:    97.7 F (36.5 C)  TempSrc:      SpO2:   97% 99%  Weight:         General: Awake, alert,  NAD, on RA Head: Sclera not icteric  Lungs: Clear anteriorly. No wheeze, rales or rhonchi. Breathing is unlabored. Heart: RRR. No murmur, rubs or gallops.  Abdomen: soft, large, non-tender Lower extremities: B/L BKA with (+) edema at stumps Neuro: AAOx3. Moves all extremities spontaneously. Dialysis Access: L AVF (+) B/T  Dialysis Orders:  TTS - [facility] 4hrs79min, BFR 450, DFR 800,  EDW 107kg, 2K/ 2Ca Heparin  None Mircera: Noted was last given on 5/3. Last Hgb from 5/15 was at goal. Hectorol  2mcg IV qHD - last 01/23/24  Assessment/Plan: C-diff colitis (hx  c-diff 09/2023) - On PO Vanc, GI panel pending, per Primary ESRD -  on HD TTS. Last hD 5/15. Plan for HD today per his usual schedule. Thankfully, renal labs appear stable so no indication for urgent treatment Hypertension/volume  - He's overloaded 2nd missed HD but he's on RA and not in acute resp distress which is reassuring. Bps are soft. Noted Midodrine  ordered PRN in outpatient. Ordered Midodrine  10mg  X1 pre-hD today. Ok to give another dose mid-run if needed. Will consider having Midodrine  on board with HD treatments. Anemia of CKD - Hgb 10.5 which is stable. Hold off on restarting ESA for now. Secondary Hyperparathyroidism -  Corr Ca low and phos very high. Resume binders and VDRA. Ordered 3Ca bath with HD today Nutrition - Renal diet with fluid restriction  Jadene Maxwell, NP [facility] 02/01/2024, 1:29 PM

## 2024-02-01 NOTE — Procedures (Signed)
 Patient was seen on dialysis and the procedure was supervised.  BFR 400  Via AVF BP is  138/74.   Patient appears to be tolerating treatment well  Krislynn Gronau A Donae Kueker 02/01/2024

## 2024-02-01 NOTE — Progress Notes (Signed)
 Patient was taken for dialysis via bed.

## 2024-02-01 NOTE — ED Notes (Signed)
 2 W called aware patient is coming

## 2024-02-01 NOTE — Progress Notes (Addendum)
 Received patient in bed to unit.  Alert and oriented.  Informed consent signed and in chart.   TX duration:3 hours   Patient tolerated well.  Transported back to the room  Alert, without acute distress.  Hand-off given to patient's nurse.   Access used: R Upper Arm Fistula Access issues: none  Total UF removed: 4L Medication(s) given: Tylenol , Hecotral   02/01/24 1822  Vitals  Temp 98.9 F (37.2 C)  Temp Source Oral  BP (!) 118/46  Pulse Rate (!) 31  ECG Heart Rate (!) 109  Resp (!) 29  Oxygen  Therapy  SpO2 98 %  O2 Device Room Air  During Treatment Monitoring  HD Safety Checks Performed Yes  Intra-Hemodialysis Comments Tx completed  Dialysis Fluid Bolus Normal Saline  Bolus Amount (mL) 300 mL  Post Treatment  Dialyzer Clearance Lightly streaked  Liters Processed 72  Fluid Removed (mL) 4000 mL  Tolerated HD Treatment Yes  AVG/AVF Arterial Site Held (minutes) 7 minutes  AVG/AVF Venous Site Held (minutes) 7 minutes  Fistula / Graft Left Upper arm Arteriovenous fistula  Placement Date/Time: 07/21/20 0825   Placed prior to admission: No  Orientation: Left  Access Location: Upper arm  Access Type: Arteriovenous fistula  Status Deaccessed     Luciano Ruths LPN [facility]

## 2024-02-01 NOTE — H&P (Addendum)
 History and Physical    [name] FAO:[id number] DOB: 07-05-1956 DOA: 01/31/2024  PCP: Erman Hayward, DO  Patient coming from: [place]  I have personally briefly reviewed patient's old medical records in [facility]  Chief Complaint: diarrhea and abdominal pain  HPI: [name] is [name] 68 y.o. male with medical history significant of ESRD (TTS), CAD s/p recent PCI, c diff colitis in January 2025, bilateral BKA, HTN, dyslipidemia, and multiple other medical issues here with c/o diarrhea and abdominal pain.  History obtained with assistance from the patient's wife as he's mildly confused.  He notes he's here with diarrhea and missed dialysis sessions.  Notes sore bottom from frequent stools.  Denies fevers.  Notes chills.  No cough, no CP, no SOB.  Can't clearly say when his lats dialysis session was, but reportedly missed 3 sessions.  Smokes, doesn't drink.   His wife adds that he's been confused recently.  Waxing and waning, sometimes he's his normal self, other times he's out of it.  Also notes abdominal discomfort and swelling.  She notes he's been at [facility] for rehab.    ED Course: Labs, imaging, abx.  Admit for c diff colitis.  Carryover admission.   Review of Systems: As per HPI otherwise all other systems reviewed and are negative.  Past Medical History:  Diagnosis Date   Carotid artery occlusion 11/10/2010   LEFT CAROTID ENDARTERECTOMY   Complication of anesthesia    BP WENT UP AT [facility] "   COPD (chronic obstructive pulmonary disease) (HCC)    pt denies this dx as of 06/01/20 - no inhaler    Diabetes mellitus without complication (HCC)    Diverticulitis    Diverticulosis of colon (without mention of hemorrhage)    DJD (degenerative joint disease)    knees/hands/feet/back/neck   ESRD (end stage renal disease) on dialysis (HCC)    Fatty liver    Full dentures    GERD (gastroesophageal reflux disease)    H/O hiatal hernia    History of blood transfusion     with Tariah Transue past surical procedure per patient 06/01/20   Hyperlipidemia    Hypertension    Neuromuscular disorder (HCC)    peripheral neuropathy   Non-pressure chronic ulcer of other part of left foot limited to breakdown of skin (HCC) 11/12/2016   Osteomyelitis (HCC)    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 Atherectomy left popliteal artery July 2012.    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   Unstable angina (HCC) 09/16/2018   Wears glasses     Past Surgical History:  Procedure Laterality Date   ABDOMINAL AORTOGRAM W/LOWER EXTREMITY N/Kyshawn Teal 06/23/2021   Procedure: ABDOMINAL AORTOGRAM W/LOWER EXTREMITY;  Surgeon: Cody Das, MD;  [facility] INV[facility] LAB;  Service: Cardiovascular;  Laterality: N/Destynee Stringfellow;   AMPUTATION  11/05/2011   Procedure: AMPUTATION RAY;  Surgeon: Amada Backer, MD;  Location: [facility] OR;  Service: Orthopedics;  Laterality: Right;  Amputation of Right 4&5th Toes   AMPUTATION Left 11/26/2012   Procedure: AMPUTATION RAY;  Surgeon: Amada Backer, MD;  Location: [facility] OR;  Service: Orthopedics;  Laterality: Left;  fourth ray amputation   AMPUTATION Right 08/27/2014   Procedure: Transmetatarsal Amputation;  Surgeon: Timothy Ford, MD;  [facility]: [facility] OR;  Service: Orthopedics;  Laterality: Right;   AMPUTATION Right 01/14/2015   Procedure: AMPUTATION BELOW KNEE;  Surgeon: Timothy Ford, MD;  [facility];  Service: Orthopedics;  Laterality: Right;   AMPUTATION Left 10/21/2015   Procedure: Left Foot 5th Ray Amputation;  Surgeon: Timothy Ford, MD;  Location: [facility] OR;  Service: Orthopedics;  Laterality: Left;   AMPUTATION Left 07/04/2022   Procedure: LEFT BELOW KNEE AMPUTATION;  Surgeon: Timothy Ford, MD;  Location: [facility] OR;  Service: Orthopedics;  Laterality: Left;   ANTERIOR FUSION CERVICAL SPINE  02/06/06    C4-5, C5-6, C6-7; SURGEON DR. MAX COHEN   AV FISTULA PLACEMENT Left 06/02/2020   Procedure: ARTERIOVENOUS (AV) FISTULA CREATION LEFT;  Surgeon: Adine Hoof, MD;  Location: [facility] OR;  Service: Vascular;  Laterality: Left;   BACK SURGERY     x 3   BASCILIC VEIN TRANSPOSITION Left 07/21/2020   Procedure: LEFT UPPER ARM ATERIOVENOUS SUPERFISTULALIZATION;  Surgeon: Adine Hoof, MD;  Location: [facility] OR;  Service: Vascular;  Laterality: Left;   BELOW KNEE LEG AMPUTATION Right    CARDIAC CATHETERIZATION  10/31/04   2009   CAROTID ENDARTERECTOMY  11/10/10   CAROTID ENDARTERECTOMY Left 11/10/2010   Subtotal occlusion of left internal carotid artery with left hemispheric transient ischemic attacks.   CAROTID STENT     CARPAL TUNNEL RELEASE Right 10/21/2013   Procedure: RIGHT CARPAL TUNNEL RELEASE;  Surgeon: Kemp Patter, MD;  Location: [facility];  Service: Orthopedics;  Laterality: Right;   CHOLECYSTECTOMY     COLON SURGERY     COLONOSCOPY     COLOSTOMY REVERSAL  05/21/2018   ileostomy reversal   CORONARY LITHOTRIPSY N/Stevana Dufner 11/24/2023   Procedure: CORONARY LITHOTRIPSY;  Surgeon: Swaziland, Peter M, MD;  Location: [facility] INVASIVE CV LAB;  Service: Cardiovascular;  Laterality: N/Brogen Duell;   CORONARY STENT INTERVENTION N/Kidus Delman 11/24/2023   Procedure: CORONARY STENT INTERVENTION;  Surgeon: Swaziland, Peter M, MD;  Location: [facility] INVASIVE CV LAB;  Service: Cardiovascular;  Laterality: N/Silvie Obremski;   CYSTOSCOPY WITH STENT PLACEMENT Bilateral 01/13/2018   Procedure: CYSTOSCOPY WITH BILATERAL URETERAL CATHETER PLACEMENT;  Surgeon: Andrez Banker, MD;  Location: [facility] ORS;  Service: Urology;  Laterality: Bilateral;   ESOPHAGEAL MANOMETRY Bilateral 07/19/2014   Procedure: ESOPHAGEAL MANOMETRY (EM);  Surgeon: Nannette Babe, MD;  [facility]: [facility] ENDOSCOPY;  Service: Gastroenterology;  Laterality: Bilateral;   EYE SURGERY Bilateral 2020   cataract   FEMORAL ARTERY STENT     x6   FINGER SURGERY     FOOT SURGERY   04/25/2016    EXCISION BASE 5TH METATARSAL AND PARTIAL CUBOID LEFT FOOT   HERNIA REPAIR     LEFT INGUINAL AND UMBILICAL REPAIRS   HERNIA REPAIR     I & D EXTREMITY Left 04/25/2016   Procedure: EXCISION BASE 5TH METATARSAL AND PARTIAL CUBOID LEFT FOOT;  Surgeon: Timothy Ford, MD;  [facility];  Service: Orthopedics;  Laterality: Left;   ILEOSTOMY  01/13/2018   Procedure: ILEOSTOMY;  Surgeon: Adalberto Acton, MD;  Location: [facility] ORS;  Service: General;;   ILEOSTOMY CLOSURE N/Junko Ohagan 05/21/2018   Procedure: ILEOSTOMY REVERSAL ERAS PATHWAY;  Surgeon: Adalberto Acton, MD;  [facility];  Service: General;  Laterality: N/Gowri Suchan;   IR RADIOLOGIST EVAL & MGMT  11/19/2017   IR RADIOLOGIST EVAL & MGMT  12/03/2017   IR RADIOLOGIST EVAL & MGMT  12/18/2017   JOINT REPLACEMENT Right 2001   Total knee  LAMINECTOMY     X 3 LUMBAR AND X 2 CERVICAL SPINE OPERATIONS   LAPAROSCOPIC CHOLECYSTECTOMY W/ CHOLANGIOGRAPHY  11/09/04   SURGEON DR. Annetta Barter   LEFT HEART CATH AND CORONARY ANGIOGRAPHY N/Raynette Arras 09/16/2018   Procedure: LEFT HEART CATH AND CORONARY ANGIOGRAPHY;  Surgeon: Cody Das, MD;  [facility];  Service: Cardiovascular;  Laterality: N/Jewelene Mairena;   LEFT HEART CATH AND CORONARY ANGIOGRAPHY N/Requan Hardge 11/24/2023   Procedure: LEFT HEART CATH AND CORONARY ANGIOGRAPHY;  Surgeon: Swaziland, Peter M, MD;  Location: [facility] INVASIVE CV LAB;  Service: Cardiovascular;  Laterality: N/Tieshia Rettinger;   LEFT HEART CATHETERIZATION WITH CORONARY ANGIOGRAM N/Cayne Yom 10/29/2014   Procedure: LEFT HEART CATHETERIZATION WITH CORONARY ANGIOGRAM;  Surgeon: Jessica Morn, MD;  Location: [facility] CATH LAB;  Service: Cardiovascular;  Laterality: N/Laurice Kimmons;   LIGATION OF COMPETING BRANCHES OF ARTERIOVENOUS FISTULA Left 07/21/2020   Procedure: LIGATION OF COMPETING BRANCHES OF LEFT UPPER ARM ARTERIOVENOUS FISTULA;  Surgeon: Adine Hoof, MD;  Location: [facility] OR;  Service: Vascular;  Laterality: Left;   LOWER EXTREMITY ANGIOGRAM N/Samentha Perham 03/19/2012    Procedure: LOWER EXTREMITY ANGIOGRAM;  Surgeon: Odie Benne, MD;  Location: [facility] CATH LAB;  Service: Cardiovascular;  Laterality: N/Siara Gorder;   LOWER EXTREMITY ANGIOGRAPHY N/Horton Ellithorpe 06/20/2021   Procedure: LOWER EXTREMITY ANGIOGRAPHY;  Surgeon: Cody Das, MD;  [facility];  Service: Cardiovascular;  Laterality: N/Dayron Odland;   NECK SURGERY     PARTIAL COLECTOMY N/Cay Kath 01/13/2018   Procedure: LAPAROSCOPIC ASSISTED   SIGMOID COLECTOMY ILEOSTOMY;  Surgeon: Adalberto Acton, MD;  Location: [facility] ORS;  Service: General;  Laterality: N/Aubryn Spinola;   PENILE PROSTHESIS IMPLANT  08/14/05   INFRAPUBIC INSERTION OF INFLATABLE PENILE PROSTHESIS; SURGEON DR. Luster Salters   PENILE PROSTHESIS IMPLANT     PERCUTANEOUS CORONARY STENT INTERVENTION (PCI-S) Right 10/29/2014   Procedure: PERCUTANEOUS CORONARY STENT INTERVENTION (PCI-S);  Surgeon: Jessica Morn, MD;  Location: [facility] CATH LAB;  Service: Cardiovascular;  Laterality: Right;   PERIPHERAL VASCULAR INTERVENTION Left 06/23/2021   Procedure: PERIPHERAL VASCULAR INTERVENTION;  Surgeon: Cody Das, MD;  [facility];  Service: Cardiovascular;  Laterality: Left;   REMOVAL OF PENILE PROSTHESIS N/Blimie Vaness 06/14/2021   Procedure: Removal of THREE piece inflatable penile prosthesis;  Surgeon: Samson Croak, MD;  Location: [facility] OR;  Service: Urology;  Laterality: N/Raniah Karan;   SHOULDER ARTHROSCOPY     SPINE SURGERY     TOE AMPUTATION Left    TONSILLECTOMY     TOTAL KNEE ARTHROPLASTY  07/2002   RIGHT KNEE ; SURGEON  DR. [name] ALSO HAD ARTHROSCOPIC RIGHT KNEE IN  10/2001   TOTAL KNEE ARTHROPLASTY     ULNAR NERVE TRANSPOSITION Right 10/21/2013   Procedure: RIGHT ELBOW  ULNAR NERVE DECOMPRESSION;  Surgeon: Kemp Patter, MD;  Location: [facility];  Service: Orthopedics;  Laterality: Right;    Social History  reports that he has been smoking cigarettes. He has Elijah Phommachanh 17.5 pack-year smoking history. He has never used smokeless tobacco. He reports that  he does not currently use alcohol . He reports that he does not use drugs.  Allergies  Allergen Reactions   Contrast Media [Iodinated Contrast Media] Shortness Of Breath and Other (See Comments)    Difficulty breathing and altered mental status     Ivp Dye [Iodinated Contrast Media] Anaphylaxis, Shortness Of Breath and Other (See Comments)    Breathing problems, altered mental state    Latex Rash and Other (See Comments)  Tomicka Lover severe rash appears after the first 24 hours of being placed   Tape Other (See Comments) and Rash    Rash after 1 day of use  Do not leave on longer then 3 days with out be changed    Family History  Problem Relation Age of Onset   Heart disease Father        Before age 68-  CAD, BPG   Diabetes Father        Amputation   Cancer Father        PROSTATE   Hyperlipidemia Father    Hypertension Father    Heart attack Father        Triple BPG   Varicose Veins Father    Cancer Sister        Breast   Hyperlipidemia Sister    Hypertension Sister    Heart attack Brother    Colon cancer Brother    Diabetes Brother    Heart disease Brother 39       Nygeria Lager-Fib. Before age 29   Hyperlipidemia Brother    Hypertension Brother    Hypertension Son    Arthritis Other        GRANDMOTHER   Hypertension Other        OTHER FAMILY MEMBERS   Prior to Admission medications   Medication Sig Start Date End Date Taking? Authorizing Provider  amiodarone  (PACERONE ) 200 MG tablet Take 0.5 tablets (100 mg total) by mouth daily. 01/01/24   Knox Perl, MD  amLODipine  (NORVASC ) 10 MG tablet Take 10 mg by mouth in the morning. Patient not taking: Reported on 01/01/2024    [provider]  apixaban  (ELIQUIS ) 5 MG TABS tablet Take 1 tablet (5 mg total) by mouth 2 (two) times daily. 01/14/22 03/09/24  Gonfa, Taye T, MD  atorvastatin  (LIPITOR) 80 MG tablet Take 1 tablet (80 mg total) by mouth at bedtime. 11/25/23   Amoako, Prince, MD  B Complex-C-Zn-Folic Acid  (DIALYVITE /ZINC ) TABS  Take 1 tablet by mouth Every Tuesday,Thursday,and Saturday with dialysis. 10/04/21   [provider]  carvedilol  (COREG ) 3.125 MG tablet Take 1 tablet (3.125 mg total) by mouth 2 (two) times daily with Alexis Mizuno meal. 01/01/24   Knox Perl, MD  clopidogrel  (PLAVIX ) 75 MG tablet Take 1 tablet (75 mg total) by mouth daily with breakfast. 11/25/23 11/24/24  Fay Hoop, MD  famotidine  (PEPCID ) 20 MG tablet Take 20 mg by mouth every other day.    [provider]  fentaNYL  (DURAGESIC ) 50 MCG/HR 1 patch every 3 (three) days. 07/25/23   [provider]  folic acid  (FOLVITE ) 1 MG tablet Take 1 mg by mouth in the morning. 09/18/21   [provider]  gabapentin  (NEURONTIN ) 100 MG capsule Take 100 mg by mouth 3 (three) times daily.    [provider]  hydrALAZINE  (APRESOLINE ) 50 MG tablet Take 50 mg by mouth every 8 (eight) hours.    [provider]  Icosapent  Ethyl 0.5 g CAPS Take 1 capsule by mouth in the morning.    [provider]  insulin  aspart (NOVOLOG  FLEXPEN) 100 UNIT/ML FlexPen Inject 0-12 Units into the skin See admin instructions. Inject 4 units into the skin with meals in addition to sliding scale as needed. Inject as per sliding scale: If 0 - 69 = 0 initiate hypoglycemia orders;  70 - 150 = 0;  151 - 200 = 2;  201 - 250 = 4;  251 - 300 = 6;  301 - 350 =  8;  351 - 400 = 10;  401 - 450 = 12.  If >400, give 12 units, recheck in 1 hour. If still >400, call MD/NP for further orders. Subcutaneously with meals in addition to scheduled 4 unites with meals.    [provider]  isosorbide  mononitrate (IMDUR ) 60 MG 24 hr tablet Take 1 tablet (60 mg total) by mouth daily. Patient not taking: Reported on 12/02/2023 01/21/23 01/08/24  Knox Perl, MD  lidocaine -prilocaine  (EMLA ) cream Apply 1 application topically See admin instructions. Prior to Dialysis days Tuesday,Thursday and saturday 06/06/21   [provider]  liver oil-zinc  oxide  (DESITIN) 40 % ointment Apply 1 Application topically as needed for irritation.    [provider]  loratadine  (CLARITIN ) 10 MG tablet Take 10 mg by mouth in the morning.    [provider]  midodrine  (PROAMATINE ) 10 MG tablet Take 10 mg by mouth daily as needed (SBP less than 100.).    [provider]  mineral oil-hydrophilic petrolatum (AQUAPHOR) ointment Apply 1 Application topically as needed for dry skin or irritation (Upper thigh).    [provider]  nitroGLYCERIN  (NITROSTAT ) 0.4 MG SL tablet Place 0.4 mg under the tongue every 5 (five) minutes as needed for chest pain. 01/06/22   [provider]  oxyCODONE -acetaminophen  (PERCOCET /ROXICET) 5-325 MG tablet Take 1 tablet by mouth every 8 (eight) hours as needed for severe pain. 05/31/23   Ghimire, Estil Heman, MD  promethazine  (PHENERGAN ) 12.5 MG suppository Place 12.5 mg rectally daily as needed for nausea or vomiting.    [provider]  rOPINIRole  (REQUIP ) 2 MG tablet Take 2 mg by mouth at bedtime.    [provider]  tamsulosin  (FLOMAX ) 0.4 MG CAPS capsule Take 0.4 mg by mouth in the morning.    [provider]    Physical Exam: Vitals:   02/01/24 0700 02/01/24 0730 02/01/24 0745 02/01/24 0748  BP: 96/74 (!) 114/49 (!) 133/58   Pulse: 80  80   Resp: 10 (!) 22 15   Temp:    (!) 100.5 F (38.1 C)  TempSrc:    Oral  SpO2: 97%  97%   Weight:        Constitutional: appears confused Vitals:   02/01/24 0700 02/01/24 0730 02/01/24 0745 02/01/24 0748  BP: 96/74 (!) 114/49 (!) 133/58   Pulse: 80  80   Resp: 10 (!) 22 15   Temp:    (!) 100.5 F (38.1 C)  TempSrc:    Oral  SpO2: 97%  97%   Weight:       Eyes: PERRL ENMT: Mucous membranes are moist.  Neck: normal, supple Respiratory: unlabored Cardiovascular: RRR, palpable thrill over LUE dialysis access Abdomen: diffusely TTP Musculoskeletal: bilateral BKA with edema Skin: no rashes, lesions, ulcers. No  induration Neurologic: Shawnte Winton&Ox3, but poor attention, seems to almost fall asleep multiple times during our discussion - diffuse myoclonic jerking Psychiatric: Normal judgment and insight. Alert and oriented x 3. Normal mood.   Labs on Admission: I have personally reviewed following labs and imaging studies  CBC: Recent Labs  Lab 01/31/24 1822  WBC 10.1  NEUTROABS 8.2*  HGB 9.5*  HCT 29.4*  MCV 92.2  PLT 154    Basic Metabolic Panel: Recent Labs  Lab 01/31/24 1822  NA 138  K 3.8  CL 106  CO2 15*  GLUCOSE 160*  BUN 89*  CREATININE 10.65*  CALCIUM  5.8*  MG 1.3*  PHOS 9.2*    GFR: Estimated  Creatinine Clearance: 8.7 mL/min (Japheth Diekman) (by C-G formula based on SCr of 10.65 mg/dL (H)).  Liver Function Tests: Recent Labs  Lab 01/31/24 1822  AST 17  ALT 11  ALKPHOS 88  BILITOT 0.6  PROT 4.6*  ALBUMIN  2.3*    Urine analysis:    Component Value Date/Time   COLORURINE YELLOW 09/22/2023 1510   APPEARANCEUR CLEAR 09/22/2023 1510   LABSPEC 1.013 09/22/2023 1510   PHURINE 7.0 09/22/2023 1510   GLUCOSEU 150 (Alyah Boehning) 09/22/2023 1510   HGBUR NEGATIVE 09/22/2023 1510   BILIRUBINUR NEGATIVE 09/22/2023 1510   KETONESUR NEGATIVE 09/22/2023 1510   PROTEINUR >=300 (Magaby Rumberger) 09/22/2023 1510   UROBILINOGEN 0.2 01/14/2015 0346   NITRITE NEGATIVE 09/22/2023 1510   LEUKOCYTESUR SMALL (Noach Calvillo) 09/22/2023 1510    Radiological Exams on Admission: CT ABDOMEN PELVIS WO CONTRAST Result Date: 01/31/2024 CLINICAL DATA:  Right upper quadrant pain diarrhea EXAM: CT ABDOMEN AND PELVIS WITHOUT CONTRAST TECHNIQUE: Multidetector CT imaging of the abdomen and pelvis was performed following the standard protocol without IV contrast. RADIATION DOSE REDUCTION: This exam was performed according to the departmental dose-optimization program which includes automated exposure control, adjustment of the mA and/or kV according to patient size and/or use of iterative reconstruction technique. COMPARISON:  CT 07/18/2023 FINDINGS:  Lower chest: Lung bases demonstrate mild dependent atelectasis. Trace pleural effusions. Cardiomegaly with coronary vascular calcification. Aortic valvular calcification. Mitral calcification. Hepatobiliary: Cholecystectomy. No focal hepatic abnormality or biliary dilatation. Pancreas: No inflammation. Atrophic. Stable cystic lesion at the body of the pancreas measuring about 2.0 by 1.8 cm on series 3, image 31. Spleen: Normal in size without focal abnormality. Adrenals/Urinary Tract: Adrenal glands are normal. Atrophic kidneys without hydronephrosis. Urinary bladder is slightly thick walled with mild perivesical stranding Stomach/Bowel: Stomach nonenlarged. No dilated small bowel. Diffuse colon wall thickening with mild pericolonic stranding at the ascending colon and descending and rectosigmoid colon. Postsurgical changes at the sigmoid colon. Circumferential wall thickening at the rectum. Vascular/Lymphatic: Advanced aortic atherosclerosis without aneurysm. Multiple small retroperitoneal lymph nodes, all subcentimeter. Reproductive: Negative prostate Other: Negative for pelvic effusion or free air. Some perirectal stranding. Musculoskeletal: Spinal fusion hardware L4 through S1. No acute osseous abnormality IMPRESSION: 1. Findings consistent with diffuse proctocolitis likely infectious (? C diff) or inflammatory. No evidence for perforation. 2. Mild bladder wall thickening with perivesical stranding, correlate with urinalysis to exclude cystitis. 3. Cardiomegaly with trace pleural effusions. 4. Atrophic kidneys. 5. Stable small cystic pancreatic lesion. 6. Aortic atherosclerosis. Aortic Atherosclerosis (ICD10-I70.0). Electronically Signed   By: Esmeralda Hedge M.D.   On: 01/31/2024 21:05    EKG: Independently reviewed. pending  Assessment/Plan Principal Problem:   C. difficile colitis    Assessment and Plan:  Diarrhea  C Difficile Colitis  History of C Difficile infection 09/2023 Rules out for sepsis  by SIRS criteria, but he has fever CT with diffuse proctocolitis  C diff positive (doesn't meet definition for recurrent c diff reappearance within 2 months) GI pathogen panel pending Oral vancomycin   In the future, given multiple episodes of C diff infection this year, would consider prophylactic vanc if he needs abx in the future.  Suspect his risk factor is residing in [place] rehab facility.  No recent abx that I got on history.  Acute Metabolic Encephalopathy Myoclonic Jerking  Due to uremia with missed dialysis sessions Follow with dialysis Hold gabapentin  and oxycodone  for now   Volume Overload ESRD (TTS Dialysis) Discussed with renal, he's missed dialysis past several days, myoclonic jerks, mild confusion Volume  per renal  Electrolyte Abnormalities Hypocalcemia Hypomagnesemia Hyperphosphatemia Related to missed dialysis and diarrhea Replaced and follow with dialysis   Anion Gap Metabolic Acidosis  Suspect primarily related to his ESRD Will check lactic and UA for ketones   Bladder Wall Thickening Unclear significance, follow UA  Atrial Fibrillation Eliquis , amiodarone   Coronary Artery Disease  Recent PCI with DES of proximal LCX and partially successful PCI of proximal RCA lesion with shockwave lithotripsy and high pressure [place] balloon (11/24/2023) Continue eliquis  and plavix  (plan for 12 months plavix , was on ASA x1 month after cath) No CP statin  Hypertension Amlodipine  appears to have been d/c'd (or is on hold) as well as imdur  Notably has midodrine  for prn hypotension  T2DM SSI Follow A1c (last 5.7 07/2023)  Dyslipidemia Statin Resume icosapent  ethyl if able to confirm   Chronic Pain Gabapentin  on hold with his AMS Hold oxycodone  with his AMS Continue fentanyl  patch cautiously  COPD No wheezing No home meds?  RLS Resume ropinirole  if confirmed   Med rec not done - he's not reliable historian right now.  Wife doesn't have list available,  but willing to bring it in.  Was able to confirm some meds with wife and [facility], would follow final med rec.      DVT prophylaxis: eliquis   Code Status:   full  Family Communication:  Wife over phone  Disposition Plan:   Patient is from:  [facility]  Anticipated DC to:  [place]  Anticipated DC date:  Pending improvement in diarrhea  Anticipated DC barriers: Ongoing diarrhea, encephalopathy  Consults called:  renal  Admission status:  inpatient   Severity of Illness: The appropriate patient status for this patient is INPATIENT. Inpatient status is judged to be reasonable and necessary in order to provide the required intensity of service to ensure the patient's safety. The patient's presenting symptoms, physical exam findings, and initial radiographic and laboratory data in the context of their chronic comorbidities is felt to place them at high risk for further clinical deterioration. Furthermore, it is not anticipated that the patient will be medically stable for discharge from the hospital within 2 midnights of admission.   * I certify that at the point of admission it is my clinical judgment that the patient will require inpatient hospital care spanning beyond 2 midnights from the point of admission due to high intensity of service, high risk for further deterioration and high frequency of surveillance required.Donnetta Gains MD [facility]  How to contact the [facility] Attending or Consulting provider 7A - 7P or covering provider during after hours 7P -7A, for this patient?   Check the care team in [facility] and look for Shaneil Yazdi) attending/consulting [facility] provider listed and b) the [facility] team listed Log into [email or web address] and use [facility]'s universal password to access. If you do not have the password, please contact the hospital operator. Locate the [facility] provider you are looking for under [facility] and page to Edin Skarda number that you can be directly reached. If you still  have difficulty reaching the provider, please page the [facility] (Director on Call) for the Hospitalists listed on amion for assistance.  02/01/2024, 8:54 AM

## 2024-02-01 NOTE — Progress Notes (Signed)
 Patient transferred from the ED . Alert and oriented. On RA. Connected to cardiac monitor box 19. Will continue to monitor

## 2024-02-01 NOTE — Progress Notes (Signed)
 Patient came with Fentanyl  patch on his L/ upper chest. RN removed that per MD.

## 2024-02-02 DIAGNOSIS — A0472 Enterocolitis due to Clostridium difficile, not specified as recurrent: Secondary | ICD-10-CM | POA: Diagnosis not present

## 2024-02-02 LAB — COMPREHENSIVE METABOLIC PANEL WITH GFR
ALT: 11 U/L (ref 0–44)
AST: 12 U/L — ABNORMAL LOW (ref 15–41)
Albumin: 2.3 g/dL — ABNORMAL LOW (ref 3.5–5.0)
Alkaline Phosphatase: 92 U/L (ref 38–126)
Anion gap: 18 — ABNORMAL HIGH (ref 5–15)
BUN: 68 mg/dL — ABNORMAL HIGH (ref 8–23)
CO2: 20 mmol/L — ABNORMAL LOW (ref 22–32)
Calcium: 7.2 mg/dL — ABNORMAL LOW (ref 8.9–10.3)
Chloride: 100 mmol/L (ref 98–111)
Creatinine, Ser: 9.62 mg/dL — ABNORMAL HIGH (ref 0.61–1.24)
GFR, Estimated: 5 mL/min — ABNORMAL LOW (ref >60)
Glucose, Bld: 109 mg/dL — ABNORMAL HIGH (ref 70–99)
Potassium: 4 mmol/L (ref 3.5–5.1)
Sodium: 138 mmol/L (ref 135–145)
Total Bilirubin: 1.2 mg/dL (ref 0.0–1.2)
Total Protein: 5 g/dL — ABNORMAL LOW (ref 6.5–8.1)

## 2024-02-02 LAB — CBC
HCT: 33.6 % — ABNORMAL LOW (ref 39.0–52.0)
Hemoglobin: 10.8 g/dL — ABNORMAL LOW (ref 13.0–17.0)
MCH: 28.9 pg (ref 26.0–34.0)
MCHC: 32.1 g/dL (ref 30.0–36.0)
MCV: 89.8 fL (ref 80.0–100.0)
Platelets: 183 10*3/uL (ref 150–400)
RBC: 3.74 MIL/uL — ABNORMAL LOW (ref 4.22–5.81)
RDW: 15.9 % — ABNORMAL HIGH (ref 11.5–15.5)
WBC: 15.3 10*3/uL — ABNORMAL HIGH (ref 4.0–10.5)
nRBC: 0 % (ref 0.0–0.2)

## 2024-02-02 LAB — GLUCOSE, CAPILLARY
Glucose-Capillary: 105 mg/dL — ABNORMAL HIGH (ref 70–99)
Glucose-Capillary: 105 mg/dL — ABNORMAL HIGH (ref 70–99)
Glucose-Capillary: 154 mg/dL — ABNORMAL HIGH (ref 70–99)
Glucose-Capillary: 156 mg/dL — ABNORMAL HIGH (ref 70–99)

## 2024-02-02 NOTE — Progress Notes (Signed)
 Subjective:  HD last night-  removed 4000 tolerated well - labs reflective of HD-   says feels rough-  belly still hurting but asking for breakfast  Objective Vital signs in last 24 hours: Vitals:   02/01/24 1840 02/01/24 2141 02/02/24 0007 02/02/24 0446  BP:  (!) 124/57 132/63 (!) 120/47  Pulse:  89 100 (!) 54  Resp:  20 20 19   Temp:  (!) 100.6 F (38.1 C) (!) 102 F (38.9 C) 99.1 F (37.3 C)  TempSrc:  Oral Oral   SpO2:   97% 93%  Weight: 99.4 kg      Weight change: -8.185 kg  Intake/Output Summary (Last 24 hours) at 02/02/2024 1610 Last data filed at 02/01/2024 1822 Gross per 24 hour  Intake 236 ml  Output 4000 ml  Net -3764 ml    Dialysis Orders:  TTS - [facility]-  AVF 4hrs56min, BFR 450, DFR 800,  EDW 107kg, 2K/ 2Ca Heparin  None Mircera: Noted was last given on 5/3. Last Hgb from 5/15 was at goal. Hectorol  2mcg IV qHD - last 01/23/24   Assessment/Plan: C-diff colitis (hx c-diff 09/2023) - On PO Vanc,  per Primary ESRD -  on HD TTS. Last OP HD 5/15.  HD 5/24 here  per his usual schedule. I think he can wait til 5/27 for next tx to keep him on schedule via AVF Hypertension/volume  - He's overloaded 2nd missed HD but he's on RA and not in acute resp distress which is reassuring. Bps are soft. Noted Midodrine  ordered PRN in outpatient. Ordered Midodrine  10mg  X1 pre-hD and PRN.   Anemia of CKD - Hgb 10.5 which is stable. Hold off on restarting ESA for now. Secondary Hyperparathyroidism -  Corr Ca low and phos very high. Resume binders and VDRA. Ordered 3Ca bath with HD  Nutrition - Renal diet with fluid restriction     [name]    Labs: Basic Metabolic Panel: Recent Labs  Lab 01/31/24 1822 02/01/24 1119 02/02/24 0752  NA 138 138 138  K 3.8 4.6 4.0  CL 106 102 100  CO2 15* 14* 20*  GLUCOSE 160* 106* 109*  BUN 89* 104* 68*  CREATININE 10.65* 12.57* 9.62*  CALCIUM  5.8* 6.8* 7.2*  PHOS 9.2* 10.9*  --    Liver Function  Tests: Recent Labs  Lab 01/31/24 1822 02/01/24 1119 02/02/24 0752  AST 17  --  12*  ALT 11  --  11  ALKPHOS 88  --  92  BILITOT 0.6  --  1.2  PROT 4.6*  --  5.0*  ALBUMIN  2.3* 2.5* 2.3*   Recent Labs  Lab 01/31/24 1822  LIPASE 20   No results for input(s): "AMMONIA" in the last 168 hours. CBC: Recent Labs  Lab 01/31/24 1822 02/01/24 1119 02/02/24 0752  WBC 10.1 11.5* 15.3*  NEUTROABS 8.2*  --   --   HGB 9.5* 10.5* 10.8*  HCT 29.4* 32.0* 33.6*  MCV 92.2 89.9 89.8  PLT 154 162 183   Cardiac Enzymes: No results for input(s): "CKTOTAL", "CKMB", "CKMBINDEX", "TROPONINI" in the last 168 hours. CBG: Recent Labs  Lab 02/01/24 1155 02/01/24 2200 02/02/24 0846  GLUCAP 112* 97 105*    Iron Studies: No results for input(s): "IRON", "TIBC", "TRANSFERRIN", "FERRITIN" in the last 72 hours. Studies/Results: CT ABDOMEN PELVIS WO CONTRAST Result Date: 01/31/2024 CLINICAL DATA:  Right upper quadrant pain diarrhea EXAM: CT ABDOMEN AND PELVIS WITHOUT CONTRAST TECHNIQUE: Multidetector CT imaging of the abdomen  and pelvis was performed following the standard protocol without IV contrast. RADIATION DOSE REDUCTION: This exam was performed according to the departmental dose-optimization program which includes automated exposure control, adjustment of the mA and/or kV according to patient size and/or use of iterative reconstruction technique. COMPARISON:  CT 07/18/2023 FINDINGS: Lower chest: Lung bases demonstrate mild dependent atelectasis. Trace pleural effusions. Cardiomegaly with coronary vascular calcification. Aortic valvular calcification. Mitral calcification. Hepatobiliary: Cholecystectomy. No focal hepatic abnormality or biliary dilatation. Pancreas: No inflammation. Atrophic. Stable cystic lesion at the body of the pancreas measuring about 2.0 by 1.8 cm on series 3, image 31. Spleen: Normal in size without focal abnormality. Adrenals/Urinary Tract: Adrenal glands are normal. Atrophic  kidneys without hydronephrosis. Urinary bladder is slightly thick walled with mild perivesical stranding Stomach/Bowel: Stomach nonenlarged. No dilated small bowel. Diffuse colon wall thickening with mild pericolonic stranding at the ascending colon and descending and rectosigmoid colon. Postsurgical changes at the sigmoid colon. Circumferential wall thickening at the rectum. Vascular/Lymphatic: Advanced aortic atherosclerosis without aneurysm. Multiple small retroperitoneal lymph nodes, all subcentimeter. Reproductive: Negative prostate Other: Negative for pelvic effusion or free air. Some perirectal stranding. Musculoskeletal: Spinal fusion hardware L4 through S1. No acute osseous abnormality IMPRESSION: 1. Findings consistent with diffuse proctocolitis likely infectious (? C diff) or inflammatory. No evidence for perforation. 2. Mild bladder wall thickening with perivesical stranding, correlate with urinalysis to exclude cystitis. 3. Cardiomegaly with trace pleural effusions. 4. Atrophic kidneys. 5. Stable small cystic pancreatic lesion. 6. Aortic atherosclerosis. Aortic Atherosclerosis (ICD10-I70.0). Electronically Signed   By: Esmeralda Hedge M.D.   On: 01/31/2024 21:05   Medications: Infusions:   Scheduled Medications:  apixaban   5 mg Oral BID   atorvastatin   80 mg Oral QHS   carvedilol   3.125 mg Oral 2 times per day on Sunday Monday Wednesday Friday   carvedilol   3.125 mg Oral Once per day on Tuesday Thursday Saturday   Chlorhexidine  Gluconate Cloth  6 each Topical Q0600   clopidogrel   75 mg Oral Q breakfast   doxercalciferol   2 mcg Intravenous Q T,Th,Sa-HD   famotidine   20 mg Oral QODAY   fentaNYL   1 patch Transdermal Q72H   folic acid   1 mg Oral q AM   insulin  aspart  0-5 Units Subcutaneous QHS   insulin  aspart  0-6 Units Subcutaneous TID WC   magnesium  oxide  400 mg Oral Daily   sevelamer  carbonate  1,600 mg Oral TID WC   tamsulosin   0.4 mg Oral q AM   vancomycin   125 mg Oral QID     have reviewed scheduled and prn medications.  Physical Exam: General: chronically ill appearing but non toxic Heart: tachy Lungs: dec BS at bases Abdomen: obese, distended, tender Extremities: pitting edema but likely third spacing- bilat BKA Dialysis Access: left upper arm AVF-  patent    02/02/2024,9:05 AM  LOS: 1 day

## 2024-02-02 NOTE — Plan of Care (Signed)

## 2024-02-02 NOTE — Progress Notes (Signed)
 PROGRESS NOTE    [name]  UJW:[id number] DOB: 03-10-1956 DOA: 01/31/2024 PCP: Aloha Arnold, [facility] course:  68 y.o. male with HTN, DM 2, COPD, PAD s/p bilateral BKA, paroxysmal A-fib on Eliquis , ESRD, CAD s/p recent PCI, c diff colitis in January 2025, bilateral BKA, HTN admitted from [facility] rehab for recurrent diarrhea and missed HD resulting in worsened confusion and lethargy. Patient seen by renal and had emergent dialysis night of admission.  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   Subjective:  Patient states that he feels  better.  Notes that he was having 3-4 bowel movements a day, has not had any since early this morning.  Denies abdominal pain, shortness of breath or confusion.  Exam:  General: Chronically ill-appearing man with bilateral BKA sitting in bed in NAD, normal respiratory effort Eyes: sclera anicteric, conjuctiva mild injection bilaterally CVS: S1-S2, regular  Respiratory:  decreased air entry bilaterally secondary to decreased inspiratory effort, rales at bases  GI: Obese, NABS, soft, minimal tenderness to deep palpation throughout LE: Warm and well-perfused Neuro: A/O x 3,  grossly nonfocal.  Psych: patient is logical and coherent, judgement and insight appear normal, mood and affect appropriate to situation.  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 difficile January 2025 C. difficile antigen and toxin are both positive on admission CT scan does show diffuse proctocolitis consistent with C. Difficile The rest of the GI panel is negative Patient appears to be responding to oral vancomycin  with decreased frequency of stools overnight.   Acute Metabolic Encephalopathy  Myoclonic Jerking  Volume Overload ESRD with missed dialysis  Electrolyte (Ca, MG and Phosphate) abnormalities  AG metabolic acidosis  All of these are resolved with emergent  dialysis last night. Continue to hold gabapentin  and oxycodone  for now     HTN Resolved with dialysis Continue as needed midodrine  in cases of hypotension  DM 2 Last hemoglobin A1c was 5.7 Sugars are under excellent control  Chronic pain Continue fentanyl  patch Oxycodone  and gabapentin  were held because of altered mental status last night Today patient no longer seems confused, can add back if warranted  Atrial Fibrillation Continue amiodarone  for rate and rhythm control Continue Eliquis  for ongoing stroke prevention  Coronary Artery Disease  Patient denies any chest pain and shortness of breath has resolved after dialysis Continue Plavix  x 12 months (aspirin  has been discontinued after 1 month) Recent PCI with DES of proximal LCX and partially successful PCI of proximal RCA lesion with shockwave lithotripsy and high pressure Bennett balloon (11/24/2023) Continue statin     DVT prophylaxis: El;iquis Code Status: Full Family Communication: None today Disposition: SNF       Pressure Injury 02/01/24 Coccyx Right;Left Stage 1 -  Intact skin with non-blanchable redness of a localized area usually over a bony prominence. redness (Active)  02/01/24 1300  Location: Coccyx  Location Orientation: Right;Left  Staging: Stage 1 -  Intact skin with non-blanchable redness of a localized area usually over a bony prominence.  Wound Description (Comments): redness  Present on Admission: Yes     Pressure Injury 02/01/24 Thigh Left;Posterior;Proximal Stage 1 -  Intact skin with non-blanchable redness of a localized area usually over a bony prominence. erythema , redness (Active)  02/01/24 1300  Location: Thigh  Location Orientation: Left;Posterior;Proximal  Staging: Stage 1 -  Intact skin with non-blanchable redness of a localized area usually over a  bony prominence. (L/ ischeal tuberosity site)  Wound Description (Comments): erythema , redness  Present on Admission: Yes     Diet Orders  (From admission, onward)     Start     Ordered   02/01/24 0945  Diet renal with fluid restriction Fluid restriction: 1200 mL Fluid; Room service appropriate? Yes; Fluid consistency: Thin  Diet effective now       Question Answer Comment  Fluid restriction: 1200 mL Fluid   Room service appropriate? Yes   Fluid consistency: Thin      02/01/24 0945            Objective: Vitals:   02/01/24 2141 02/02/24 0007 02/02/24 0446 02/02/24 1244  BP: (!) 124/57 132/63 (!) 120/47 135/63  Pulse: 89 100 (!) 54 94  Resp: 20 20 19 19   Temp: (!) 100.6 F (38.1 C) (!) 102 F (38.9 C) 99.1 F (37.3 C) 99.5 F (37.5 C)  TempSrc: Oral Oral  Oral  SpO2:  97% 93% 98%  Weight:        Intake/Output Summary (Last 24 hours) at 02/02/2024 1714 Last data filed at 02/01/2024 1822 Gross per 24 hour  Intake --  Output 4000 ml  Net -4000 ml   Filed Weights   01/31/24 1745 02/01/24 1458 02/01/24 1840  Weight: 111.6 kg 103.4 kg 99.4 kg    Scheduled Meds:  apixaban   5 mg Oral BID   atorvastatin   80 mg Oral QHS   carvedilol   3.125 mg Oral 2 times per day on Sunday Monday Wednesday Friday   carvedilol   3.125 mg Oral Once per day on Tuesday Thursday Saturday   Chlorhexidine  Gluconate Cloth  6 each Topical Q0600   clopidogrel   75 mg Oral Q breakfast   doxercalciferol   2 mcg Intravenous Q T,Th,Sa-HD   famotidine   20 mg Oral QODAY   fentaNYL   1 patch Transdermal Q72H   folic acid   1 mg Oral q AM   insulin  aspart  0-5 Units Subcutaneous QHS   insulin  aspart  0-6 Units Subcutaneous TID WC   magnesium  oxide  400 mg Oral Daily   sevelamer  carbonate  1,600 mg Oral TID WC   tamsulosin   0.4 mg Oral q AM   vancomycin   125 mg Oral QID   Continuous Infusions:  Nutritional status     Body mass index is 28.14 kg/m.  Data Reviewed:   CBC: Recent Labs  Lab 01/31/24 1822 02/01/24 1119 02/02/24 0752  WBC 10.1 11.5* 15.3*  NEUTROABS 8.2*  --   --   HGB 9.5* 10.5* 10.8*  HCT 29.4* 32.0* 33.6*  MCV  92.2 89.9 89.8  PLT 154 162 183   Basic Metabolic Panel: Recent Labs  Lab 01/31/24 1822 02/01/24 1119 02/02/24 0752  NA 138 138 138  K 3.8 4.6 4.0  CL 106 102 100  CO2 15* 14* 20*  GLUCOSE 160* 106* 109*  BUN 89* 104* 68*  CREATININE 10.65* 12.57* 9.62*  CALCIUM  5.8* 6.8* 7.2*  MG 1.3*  --   --   PHOS 9.2* 10.9*  --    GFR: Estimated Creatinine Clearance: 9.1 mL/min (A) (by C-G formula based on SCr of 9.62 mg/dL (H)). Liver Function Tests: Recent Labs  Lab 01/31/24 1822 02/01/24 1119 02/02/24 0752  AST 17  --  12*  ALT 11  --  11  ALKPHOS 88  --  92  BILITOT 0.6  --  1.2  PROT 4.6*  --  5.0*  ALBUMIN  2.3* 2.5*  2.3*   Recent Labs  Lab 01/31/24 1822  LIPASE 20   No results for input(s): "AMMONIA" in the last 168 hours. Coagulation Profile: No results for input(s): "INR", "PROTIME" in the last 168 hours. Cardiac Enzymes: No results for input(s): "CKTOTAL", "CKMB", "CKMBINDEX", "TROPONINI" in the last 168 hours. BNP (last 3 results) No results for input(s): "PROBNP" in the last 8760 hours. HbA1C: Recent Labs    02/01/24 1119  HGBA1C 5.4   CBG: Recent Labs  Lab 02/01/24 1155 02/01/24 2200 02/02/24 0846 02/02/24 1236  GLUCAP 112* 97 105* 105*   Lipid Profile: No results for input(s): "CHOL", "HDL", "LDLCALC", "TRIG", "CHOLHDL", "LDLDIRECT" in the last 72 hours. Thyroid  Function Tests: No results for input(s): "TSH", "T4TOTAL", "FREET4", "T3FREE", "THYROIDAB" in the last 72 hours. Anemia Panel: No results for input(s): "VITAMINB12", "FOLATE", "FERRITIN", "TIBC", "IRON", "RETICCTPCT" in the last 72 hours. Sepsis Labs: Recent Labs  Lab 02/01/24 1119  LATICACIDVEN 1.0    Recent Results (from the past 240 hours)  C Difficile Quick Screen w PCR reflex     Status: Abnormal   Collection Time: 02/01/24  3:40 AM   Specimen: STOOL  Result Value Ref Range Status   C Diff antigen POSITIVE (A) NEGATIVE Final   C Diff toxin POSITIVE (A) NEGATIVE Final   C  Diff interpretation Toxin producing C. difficile detected.  Final    Comment: CRITICAL RESULTS CALLED TO, READ BACK BY AND VERIFIED WITH RN K.HAMBRICK ON 02/01/24 AT 0425 BY NM Performed at [facility], [address], [place], [place] [address]   Gastrointestinal Panel by PCR , Stool     Status: None   Collection Time: 02/01/24  3:40 AM   Specimen: STOOL  Result Value Ref Range Status   Campylobacter species NOT DETECTED NOT DETECTED Final   Plesimonas shigelloides NOT DETECTED NOT DETECTED Final   Salmonella species NOT DETECTED NOT DETECTED Final   Yersinia enterocolitica NOT DETECTED NOT DETECTED Final   Vibrio species NOT DETECTED NOT DETECTED Final   Vibrio cholerae NOT DETECTED NOT DETECTED Final   Enteroaggregative E coli (EAEC) NOT DETECTED NOT DETECTED Final   Enteropathogenic E coli (EPEC) NOT DETECTED NOT DETECTED Final   Enterotoxigenic E coli (ETEC) NOT DETECTED NOT DETECTED Final   Shiga like toxin producing E coli (STEC) NOT DETECTED NOT DETECTED Final   Shigella/Enteroinvasive E coli (EIEC) NOT DETECTED NOT DETECTED Final   Cryptosporidium NOT DETECTED NOT DETECTED Final   Cyclospora cayetanensis NOT DETECTED NOT DETECTED Final   Entamoeba histolytica NOT DETECTED NOT DETECTED Final   Giardia lamblia NOT DETECTED NOT DETECTED Final   Adenovirus F40/41 NOT DETECTED NOT DETECTED Final   Astrovirus NOT DETECTED NOT DETECTED Final   Norovirus GI/GII NOT DETECTED NOT DETECTED Final   Rotavirus A NOT DETECTED NOT DETECTED Final   Sapovirus (I, II, IV, and V) NOT DETECTED NOT DETECTED Final    Comment: Performed at [facility], [address] [place], [place] [address]         Radiology Studies: CT ABDOMEN PELVIS WO CONTRAST Result Date: 01/31/2024 CLINICAL DATA:  Right upper quadrant pain diarrhea EXAM: CT ABDOMEN AND PELVIS WITHOUT CONTRAST TECHNIQUE: Multidetector CT imaging of the abdomen and pelvis was performed following the standard protocol  without IV contrast. RADIATION DOSE REDUCTION: This exam was performed according to the departmental dose-optimization program which includes automated exposure control, adjustment of the mA and/or kV according to patient size and/or use of iterative reconstruction technique. COMPARISON:  CT 07/18/2023 FINDINGS: Lower chest: Lung bases demonstrate mild dependent atelectasis. Trace pleural effusions. Cardiomegaly with coronary vascular calcification. Aortic valvular calcification. Mitral calcification. Hepatobiliary: Cholecystectomy. No focal hepatic abnormality or biliary dilatation. Pancreas: No inflammation. Atrophic. Stable cystic lesion at the body of the pancreas measuring about 2.0 by 1.8 cm on series 3, image 31. Spleen: Normal in size without focal abnormality. Adrenals/Urinary Tract: Adrenal glands are normal. Atrophic kidneys without hydronephrosis. Urinary bladder is slightly thick walled with mild perivesical stranding Stomach/Bowel: Stomach nonenlarged. No dilated small bowel. Diffuse colon wall thickening with mild pericolonic stranding at the ascending colon and descending and rectosigmoid colon. Postsurgical changes at the sigmoid colon. Circumferential wall thickening at the rectum. Vascular/Lymphatic: Advanced aortic atherosclerosis without aneurysm. Multiple small retroperitoneal lymph nodes, all subcentimeter. Reproductive: Negative prostate Other: Negative for pelvic effusion or free air. Some perirectal stranding. Musculoskeletal: Spinal fusion hardware L4 through S1. No acute osseous abnormality IMPRESSION: 1. Findings consistent with diffuse proctocolitis likely infectious (? C diff) or inflammatory. No evidence for perforation. 2. Mild bladder wall thickening with perivesical stranding, correlate with urinalysis to exclude cystitis. 3. Cardiomegaly with trace pleural effusions. 4. Atrophic kidneys. 5. Stable small cystic pancreatic lesion. 6. Aortic atherosclerosis. Aortic Atherosclerosis  (ICD10-I70.0). Electronically Signed   By: Esmeralda Hedge M.D.   On: 01/31/2024 21:05           LOS: 1 day   Time spent= 35 mins    Magdalene School, MD [facility]  If 7PM-7AM, please contact night-coverage  02/02/2024, 5:14 PM

## 2024-02-02 NOTE — Hospital Course (Addendum)
 Hospital course:  68 y.o. male with HTN, DM 2, COPD, PAD s/p bilateral BKA, paroxysmal A-fib on Eliquis , ESRD, CAD s/p recent PCI, c diff colitis in January 2025, bilateral BKA, HTN admitted from [facility] rehab for recurrent diarrhea and missed HD resulting in worsened confusion and lethargy. Patient seen by renal and had emergent dialysis night of admission.  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   Subjective:  Patient states that he feels well.  Has more energy.  Is not sure if he is having diarrhea but does not think so.   Exam:  General: Patient looks much more energetic, less confused, sitting in bed in NAD, normal respiratory effort Eyes: sclera anicteric, conjuctiva mild injection bilaterally CVS: S1-S2, regular  Respiratory:  decreased air entry bilaterally secondary to decreased inspiratory effort, rales at bases  GI: Obese, NABS, soft, minimal tenderness to deep palpation throughout LE: Warm and well-perfused Neuro: A/O x 3,  grossly nonfocal.  Psych: patient is logical and coherent, judgement and insight appear normal, mood and affect appropriate to situation.  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 difficile January 2025 C. difficile antigen and toxin are both positive on admission CT scan does show diffuse proctocolitis consistent with C. Difficile The rest of the GI panel is negative Per RN, patient has much decreased bowel movements and only had 1 little stool today.  Patient can likely be discharged back to SNF tomorrow   Acute Metabolic Encephalopathy  Myoclonic Jerking  Volume Overload ESRD with missed dialysis  Electrolyte (Ca, MG and Phosphate) abnormalities  AG metabolic acidosis  All of these are resolved with emergent dialysis last night. Continue to hold gabapentin  and oxycodone  for now     HTN Resolved with dialysis Continue as needed midodrine  in cases of  hypotension  DM 2 Last hemoglobin A1c was 5.7 Sugars are under excellent control  Chronic pain Continue fentanyl  patch Oxycodone  and gabapentin  were held because of altered mental status last night Today patient no longer seems confused, can add back if warranted  Atrial Fibrillation Continue amiodarone  for rate and rhythm control Continue Eliquis  for ongoing stroke prevention  Coronary Artery Disease  Patient denies any chest pain and shortness of breath has resolved after dialysis Continue Plavix  x 12 months (aspirin  has been discontinued after 1 month) Recent PCI with DES of proximal LCX and partially successful PCI of proximal RCA lesion with shockwave lithotripsy and high pressure [place] balloon (11/24/2023) Continue statin     DVT prophylaxis: El;iquis Code Status: Full Family Communication: None today Disposition: SNF

## 2024-02-03 DIAGNOSIS — A0472 Enterocolitis due to Clostridium difficile, not specified as recurrent: Secondary | ICD-10-CM | POA: Diagnosis not present

## 2024-02-03 LAB — GLUCOSE, CAPILLARY
Glucose-Capillary: 152 mg/dL — ABNORMAL HIGH (ref 70–99)
Glucose-Capillary: 153 mg/dL — ABNORMAL HIGH (ref 70–99)
Glucose-Capillary: 160 mg/dL — ABNORMAL HIGH (ref 70–99)
Glucose-Capillary: 167 mg/dL — ABNORMAL HIGH (ref 70–99)

## 2024-02-03 MED ORDER — CHLORHEXIDINE GLUCONATE CLOTH 2 % EX PADS
6.0000 | MEDICATED_PAD | Freq: Every day | CUTANEOUS | Status: DC
  Administered 2024-02-03 – 2024-02-04 (×2): 6 via TOPICAL

## 2024-02-03 NOTE — Progress Notes (Signed)
 PROGRESS NOTE    [name]  HYW:[id number] DOB: 26-Aug-1956 DOA: 01/31/2024 PCP: Aloha Arnold, [facility] course:  68 y.o. male with HTN, DM 2, COPD, PAD s/p bilateral BKA, paroxysmal A-fib on Eliquis , ESRD, CAD s/p recent PCI, c diff colitis in January 2025, bilateral BKA, HTN admitted from [facility] rehab for recurrent diarrhea and missed HD resulting in worsened confusion and lethargy. Patient seen by renal and had emergent dialysis night of admission.  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   Subjective:  Patient states that he feels well.  Has more energy.  Is not sure if he is having diarrhea but does not think so.   Exam:  General: Patient looks much more energetic, less confused, sitting in bed in NAD, normal respiratory effort Eyes: sclera anicteric, conjuctiva mild injection bilaterally CVS: S1-S2, regular  Respiratory:  decreased air entry bilaterally secondary to decreased inspiratory effort, rales at bases  GI: Obese, NABS, soft, minimal tenderness to deep palpation throughout LE: Warm and well-perfused Neuro: A/O x 3,  grossly nonfocal.  Psych: patient is logical and coherent, judgement and insight appear normal, mood and affect appropriate to situation.  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 difficile January 2025 C. difficile antigen and toxin are both positive on admission CT scan does show diffuse proctocolitis consistent with C. Difficile The rest of the GI panel is negative Per RN, patient has much decreased bowel movements and only had 1 little stool today.  Patient can likely be discharged back to SNF tomorrow   Acute Metabolic Encephalopathy  Myoclonic Jerking  Volume Overload ESRD with missed dialysis  Electrolyte (Ca, MG and Phosphate) abnormalities  AG metabolic acidosis  All of these are resolved with emergent dialysis last night. Continue to  hold gabapentin  and oxycodone  for now     HTN Resolved with dialysis Continue as needed midodrine  in cases of hypotension  DM 2 Last hemoglobin A1c was 5.7 Sugars are under excellent control  Chronic pain Continue fentanyl  patch Oxycodone  and gabapentin  were held because of altered mental status last night Today patient no longer seems confused, can add back if warranted  Atrial Fibrillation Continue amiodarone  for rate and rhythm control Continue Eliquis  for ongoing stroke prevention  Coronary Artery Disease  Patient denies any chest pain and shortness of breath has resolved after dialysis Continue Plavix  x 12 months (aspirin  has been discontinued after 1 month) Recent PCI with DES of proximal LCX and partially successful PCI of proximal RCA lesion with shockwave lithotripsy and high pressure [place] balloon (11/24/2023) Continue statin     DVT prophylaxis: [place] Code Status: Full Family Communication: None today Disposition: SNF      Pressure Injury 02/01/24 Coccyx Right;Left Stage 1 -  Intact skin with non-blanchable redness of a localized area usually over a bony prominence. redness (Active)  02/01/24 1300  Location: Coccyx  Location Orientation: Right;Left  Staging: Stage 1 -  Intact skin with non-blanchable redness of a localized area usually over a bony prominence.  Wound Description (Comments): redness  Present on Admission: Yes     Pressure Injury 02/01/24 Thigh Left;Posterior;Proximal Stage 1 -  Intact skin with non-blanchable redness of a localized area usually over a bony prominence. erythema , redness (Active)  02/01/24 1300  Location: Thigh  Location Orientation: Left;Posterior;Proximal  Staging: Stage 1 -  Intact skin with non-blanchable redness of a localized area usually over  a bony prominence. (L/ ischeal tuberosity site)  Wound Description (Comments): erythema , redness  Present on Admission: Yes     Diet Orders (From admission, onward)     Start      Ordered   02/01/24 0945  Diet renal with fluid restriction Fluid restriction: 1200 mL Fluid; Room service appropriate? Yes; Fluid consistency: Thin  Diet effective now       Question Answer Comment  Fluid restriction: 1200 mL Fluid   Room service appropriate? Yes   Fluid consistency: Thin      02/01/24 0945            Objective: Vitals:   02/03/24 0520 02/03/24 0753 02/03/24 1231 02/03/24 1616  BP: 116/64 (!) 115/54 (!) 123/36 130/69  Pulse: 89 75 73 67  Resp: 20 19 20 18   Temp: 98.5 F (36.9 C) 98.6 F (37 C) 99.7 F (37.6 C) 99 F (37.2 C)  TempSrc: Oral Oral Oral   SpO2: 96% 99% 95% 100%  Weight:        Intake/Output Summary (Last 24 hours) at 02/03/2024 1806 Last data filed at 02/03/2024 1227 Gross per 24 hour  Intake 480 ml  Output --  Net 480 ml   Filed Weights   01/31/24 1745 02/01/24 1458 02/01/24 1840  Weight: 111.6 kg 103.4 kg 99.4 kg    Scheduled Meds:  apixaban   5 mg Oral BID   atorvastatin   80 mg Oral QHS   carvedilol   3.125 mg Oral 2 times per day on Sunday Monday Wednesday Friday   carvedilol   3.125 mg Oral Once per day on Tuesday Thursday Saturday   Chlorhexidine  Gluconate Cloth  6 each Topical Q0600   Chlorhexidine  Gluconate Cloth  6 each Topical Q0600   clopidogrel   75 mg Oral Q breakfast   doxercalciferol   2 mcg Intravenous Q T,Th,Sa-HD   famotidine   20 mg Oral QODAY   fentaNYL   1 patch Transdermal Q72H   folic acid   1 mg Oral q AM   insulin  aspart  0-5 Units Subcutaneous QHS   insulin  aspart  0-6 Units Subcutaneous TID WC   magnesium  oxide  400 mg Oral [name]   sevelamer  carbonate  1,600 mg Oral TID WC   tamsulosin   0.4 mg Oral q AM   vancomycin   125 mg Oral QID   Continuous Infusions:  Nutritional status     Body mass index is 28.14 kg/m.  Data Reviewed:   CBC: Recent Labs  Lab 01/31/24 1822 02/01/24 1119 02/02/24 0752  WBC 10.1 11.5* 15.3*  NEUTROABS 8.2*  --   --   HGB 9.5* 10.5* 10.8*  HCT 29.4* 32.0* 33.6*   MCV 92.2 89.9 89.8  PLT 154 162 183   Basic Metabolic Panel: Recent Labs  Lab 01/31/24 1822 02/01/24 1119 02/02/24 0752  NA 138 138 138  K 3.8 4.6 4.0  CL 106 102 100  CO2 15* 14* 20*  GLUCOSE 160* 106* 109*  BUN 89* 104* 68*  CREATININE 10.65* 12.57* 9.62*  CALCIUM  5.8* 6.8* 7.2*  MG 1.3*  --   --   PHOS 9.2* 10.9*  --    GFR: Estimated Creatinine Clearance: 9.1 mL/min (A) (by C-G formula based on SCr of 9.62 mg/dL (H)). Liver Function Tests: Recent Labs  Lab 01/31/24 1822 02/01/24 1119 02/02/24 0752  AST 17  --  12*  ALT 11  --  11  ALKPHOS 88  --  92  BILITOT 0.6  --  1.2  PROT 4.6*  --  5.0*  ALBUMIN  2.3* 2.5* 2.3*   Recent Labs  Lab 01/31/24 1822  LIPASE 20   No results for input(s): "AMMONIA" in the last 168 hours. Coagulation Profile: No results for input(s): "INR", "PROTIME" in the last 168 hours. Cardiac Enzymes: No results for input(s): "CKTOTAL", "CKMB", "CKMBINDEX", "TROPONINI" in the last 168 hours. BNP (last 3 results) No results for input(s): "PROBNP" in the last 8760 hours. HbA1C: Recent Labs    02/01/24 1119  HGBA1C 5.4   CBG: Recent Labs  Lab 02/02/24 1817 02/02/24 2114 02/03/24 0756 02/03/24 1229 02/03/24 1614  GLUCAP 154* 156* 152* 153* 160*   Lipid Profile: No results for input(s): "CHOL", "HDL", "LDLCALC", "TRIG", "CHOLHDL", "LDLDIRECT" in the last 72 hours. Thyroid  Function Tests: No results for input(s): "TSH", "T4TOTAL", "FREET4", "T3FREE", "THYROIDAB" in the last 72 hours. Anemia Panel: No results for input(s): "VITAMINB12", "FOLATE", "FERRITIN", "TIBC", "IRON", "RETICCTPCT" in the last 72 hours. Sepsis Labs: Recent Labs  Lab 02/01/24 1119  LATICACIDVEN 1.0    Recent Results (from the past 240 hours)  C Difficile Quick Screen w PCR reflex     Status: Abnormal   Collection Time: 02/01/24  3:40 AM   Specimen: STOOL  Result Value Ref Range Status   C Diff antigen POSITIVE (A) NEGATIVE Final   C Diff toxin  POSITIVE (A) NEGATIVE Final   C Diff interpretation Toxin producing C. difficile detected.  Final    Comment: CRITICAL RESULTS CALLED TO, READ BACK BY AND VERIFIED WITH RN K.HAMBRICK ON 02/01/24 AT 0425 BY NM Performed at [facility], [address], [place], [place] [address]   Gastrointestinal Panel by PCR , Stool     Status: None   Collection Time: 02/01/24  3:40 AM   Specimen: STOOL  Result Value Ref Range Status   Campylobacter species NOT DETECTED NOT DETECTED Final   Plesimonas shigelloides NOT DETECTED NOT DETECTED Final   Salmonella species NOT DETECTED NOT DETECTED Final   Yersinia enterocolitica NOT DETECTED NOT DETECTED Final   Vibrio species NOT DETECTED NOT DETECTED Final   Vibrio cholerae NOT DETECTED NOT DETECTED Final   Enteroaggregative E coli (EAEC) NOT DETECTED NOT DETECTED Final   Enteropathogenic E coli (EPEC) NOT DETECTED NOT DETECTED Final   Enterotoxigenic E coli (ETEC) NOT DETECTED NOT DETECTED Final   Shiga like toxin producing E coli (STEC) NOT DETECTED NOT DETECTED Final   Shigella/Enteroinvasive E coli (EIEC) NOT DETECTED NOT DETECTED Final   Cryptosporidium NOT DETECTED NOT DETECTED Final   Cyclospora cayetanensis NOT DETECTED NOT DETECTED Final   Entamoeba histolytica NOT DETECTED NOT DETECTED Final   Giardia lamblia NOT DETECTED NOT DETECTED Final   Adenovirus F40/41 NOT DETECTED NOT DETECTED Final   Astrovirus NOT DETECTED NOT DETECTED Final   Norovirus GI/GII NOT DETECTED NOT DETECTED Final   Rotavirus A NOT DETECTED NOT DETECTED Final   Sapovirus (I, II, IV, and V) NOT DETECTED NOT DETECTED Final    Comment: Performed at [facility], [address], [place], [place] [address]         Radiology Studies: No results found.         LOS: 2 days   Time spent= 35 mins    Magdalene School, MD [facility]  If 7PM-7AM, please contact night-coverage  02/03/2024, 6:06 PM

## 2024-02-03 NOTE — Plan of Care (Signed)

## 2024-02-03 NOTE — Progress Notes (Signed)
 [facility] Progress Note  Subjective:  BP's good Pt w/o complaints  Vitals:   02/02/24 1823 02/02/24 2029 02/03/24 0520 02/03/24 0753  BP: (!) 152/66 (!) 148/71 116/64 (!) 115/54  Pulse: 73 75 89 75  Resp: 18 16 20 19   Temp: 98.3 F (36.8 C) 98.2 F (36.8 C) 98.5 F (36.9 C) 98.6 F (37 C)  TempSrc:   Oral Oral  SpO2: 100% 98% 96% 99%  Weight:        Exam: General: chronically ill appearing, no distress Heart: RRR , no RG Lungs: dec BS at bases Abdomen: obese, distended, tender Extremities: bilat BKA w/ 1-2+ bilat edema Dialysis Access: LUA AVF+bruit       OP HD: [facility]  4h   [id number]   107kg   AVF  Heparin  none Mircera 225 mcg last given 5/3 Hectorol  2mcg IV three times per week   Assessment/ Plan: C-diff colitis (hx c-diff 09/2023): on PO Vanc,  per pmd  ESRD:  on HD TTS. Last OP HD 5/15.  HD here 5/24, next HD tomorrow.  BP: bp's wnl, on coreg  here Volume: well below dry wt, on RA. Still w/ bilat stump edema, will get what we can w/ next HD.  Anemia of CKD - Hgb 10.5, no esa needs for now. Follow.  Secondary Hyperparathyroidism -  Corr Ca low and phos high. Getting binders and VDRA. Ordered 3Ca bath w/ HD.  Nutrition - Renal diet with fluid restriction  Larry Poag MD  [facility] 02/03/2024, 9:43 AM  Recent Labs  Lab 01/31/24 1822 02/01/24 1119 02/02/24 0752  HGB 9.5* 10.5* 10.8*  ALBUMIN  2.3* 2.5* 2.3*  CALCIUM  5.8* 6.8* 7.2*  PHOS 9.2* 10.9*  --   CREATININE 10.65* 12.57* 9.62*  K 3.8 4.6 4.0   No results for input(s): "IRON", "TIBC", "FERRITIN" in the last 168 hours. Inpatient medications:  apixaban   5 mg Oral BID   atorvastatin   80 mg Oral QHS   carvedilol   3.125 mg Oral 2 times per day on Sunday Monday Wednesday Friday   carvedilol   3.125 mg Oral Once per day on Tuesday Thursday Saturday   Chlorhexidine  Gluconate Cloth  6 each Topical Q0600   clopidogrel   75 mg Oral Q breakfast   doxercalciferol   2 mcg Intravenous Q T,Th,Sa-HD    famotidine   20 mg Oral QODAY   fentaNYL   1 patch Transdermal Q72H   folic acid   1 mg Oral q AM   insulin  aspart  0-5 Units Subcutaneous QHS   insulin  aspart  0-6 Units Subcutaneous TID WC   magnesium  oxide  400 mg Oral Daily   sevelamer  carbonate  1,600 mg Oral TID WC   tamsulosin   0.4 mg Oral q AM   vancomycin   125 mg Oral QID    acetaminophen  **OR** acetaminophen 

## 2024-02-04 ENCOUNTER — Encounter (HOSPITAL_COMMUNITY): Payer: Self-pay | Admitting: Family Medicine

## 2024-02-04 DIAGNOSIS — L8990 Pressure ulcer of unspecified site, unspecified stage: Secondary | ICD-10-CM | POA: Insufficient documentation

## 2024-02-04 DIAGNOSIS — A0472 Enterocolitis due to Clostridium difficile, not specified as recurrent: Secondary | ICD-10-CM | POA: Diagnosis not present

## 2024-02-04 LAB — HEPATITIS B SURFACE ANTIBODY, QUANTITATIVE: Hep B S AB Quant (Post): 12.1 m[IU]/mL

## 2024-02-04 LAB — GLUCOSE, CAPILLARY
Glucose-Capillary: 180 mg/dL — ABNORMAL HIGH (ref 70–99)
Glucose-Capillary: 203 mg/dL — ABNORMAL HIGH (ref 70–99)

## 2024-02-04 MED ORDER — MAGNESIUM OXIDE -MG SUPPLEMENT 400 (240 MG) MG PO TABS: 400.0000 mg | ORAL_TABLET | Freq: Every day | ORAL | Status: DC

## 2024-02-04 MED ORDER — SEVELAMER CARBONATE 800 MG PO TABS: 1600.0000 mg | ORAL_TABLET | Freq: Three times a day (TID) | ORAL | Status: DC

## 2024-02-04 MED ORDER — AMLODIPINE BESYLATE 10 MG PO TABS
10.0000 mg | ORAL_TABLET | Freq: Every day | ORAL | Status: DC
  Administered 2024-02-04: 10 mg via ORAL
  Filled 2024-02-04: qty 1

## 2024-02-04 MED ORDER — HYDRALAZINE HCL 50 MG PO TABS
50.0000 mg | ORAL_TABLET | Freq: Three times a day (TID) | ORAL | Status: DC
  Administered 2024-02-04: 50 mg via ORAL
  Filled 2024-02-04: qty 1

## 2024-02-04 MED ORDER — VANCOMYCIN HCL 125 MG PO CAPS: 125.0000 mg | ORAL_CAPSULE | Freq: Four times a day (QID) | ORAL | Status: DC

## 2024-02-04 NOTE — Progress Notes (Signed)
 Transport arrived for pt, pt left with all belongings and paperwork

## 2024-02-04 NOTE — Progress Notes (Addendum)
 D/C order noted. Contacted [facility] to be advised of pt's d/c today and that pt should resume care on Thursday. Clinic also advised of pt's cdiff diagnosis and that diarrhea has improved per d/c summary.   Lauraine Polite Renal Navigator [phone]  Addendum at 4:55 pm: Contacted [facility] and spoke to clinic manager to confirm clinic is prepared to resume care for pt on Thursday and that pt's cdiff diagnosis will not be an issue. Clinic manager confirms clinic aware of pt's diagnosis and prepared to resume pt's care on Thursday. This info was provided to CSW/team .

## 2024-02-04 NOTE — Progress Notes (Signed)
   02/04/24 1230  Vitals  Temp 98.1 F (36.7 C)  Pulse Rate (!) 108  Resp (!) 22  BP (!) 190/93  SpO2 100 %  Weight 116.8 kg  Type of Weight Post-Dialysis  Post Treatment  Dialyzer Clearance Lightly streaked  Hemodialysis Intake (mL) 0 mL  Liters Processed 78.5  Fluid Removed (mL) 5000 mL  Tolerated HD Treatment Yes  AVG/AVF Arterial Site Held (minutes) 6 minutes  AVG/AVF Venous Site Held (minutes) 6 minutes   Received patient in bed to unit.  Alert and oriented.  Informed consent signed and in chart.   TX duration:3.5hrs  Patient tolerated well.  Transported back to the room  Alert, without acute distress.  Hand-off given to patient's nurse.   Access used: LAVF Access issues: none  Total UF removed: 5L  Medication(s) given: none   [facility]

## 2024-02-04 NOTE — Discharge Summary (Signed)
 Physician Discharge Summary   Patient: [name] MRN: [id number] DOB: 26-Jan-1956  Admit date:     01/31/2024  Discharge date: 02/04/24  Discharge Physician: Luna Salinas   PCP: Aloha Arnold, PA-C   Recommendations at discharge:  Please obtain CBC and BMP on follow-up Continue with routine dialysis Please ensure completion of antibiotics for C. difficile colitis Follow-up with primary care provider  Discharge Diagnoses: Principal Problem:   C. difficile colitis Active Problems:   Hypomagnesemia   Hypocalcemia   Pressure injury of skin   Hospital Course: Hospital course:  68 y.o. male with HTN, DM 2, COPD, PAD s/p bilateral BKA, paroxysmal A-fib on Eliquis , ESRD, CAD s/p recent PCI, c diff colitis in January 2025, bilateral BKA, HTN admitted from [facility] rehab for recurrent diarrhea and missed HD resulting in worsened confusion and lethargy. Patient seen by renal and had emergent dialysis night of admission.  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  C. difficile antigen and toxin are both positive on admission CT scan does show diffuse proctocolitis consistent with C. Difficile. The rest of the GI panel is negative. Diarrhea resolved and patient feels much improved.  He will complete a 10-day course of p.o. vancomycin .  If he developed further recurrence he need to follow-up with gastroenterology.  Patient with significantly elevated blood pressure so midodrine  was discontinued and he was started on amlodipine  and hydralazine .  Need regular monitoring and follow-up with primary care provider for further assistance.  Patient will continue the rest of his home medications and follow-up with his providers for further assistance.  Subjective: Patient was seen during dialysis today.  No new concern.  Feeling much improved and no bowel movement since this morning.  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 difficile January 2025 C. difficile antigen and toxin are both positive on admission CT scan does show diffuse proctocolitis consistent with C. Difficile The rest of the GI panel is negative -Will complete 10-day course of p.o. vancomycin   Acute Metabolic Encephalopathy  Myoclonic Jerking  Volume Overload ESRD with missed dialysis  Electrolyte (Ca, MG and Phosphate) abnormalities  AG metabolic acidosis  All of these are resolved with emergent dialysis last night. Continue to hold gabapentin  and oxycodone  for now     HTN Blood pressure elevated.  Home midodrine  was discontinued and patient was started on amlodipine  and hydralazine  which she was taking before.  DM 2 Last hemoglobin A1c was 5.7 Sugars are under excellent control  Chronic pain Continue fentanyl  patch Oxycodone  and gabapentin  were held because of altered mental status last night Can resume as needed on discharge-avoid excessive sedation.  Atrial Fibrillation Continue amiodarone  for rate and rhythm control Continue Eliquis  for ongoing stroke prevention  Coronary Artery Disease  Patient denies any chest pain and shortness of breath has resolved after dialysis Continue Plavix  x 12 months (aspirin  has been discontinued after 1 month) Recent PCI with DES of proximal LCX and partially successful PCI of proximal RCA lesion with shockwave lithotripsy and high pressure [place] balloon (11/24/2023) Continue statin      Pain control - [place]  Controlled Substance Reporting System database was reviewed. and patient was instructed, not to drive, operate heavy machinery, perform activities at heights, swimming or participation in water  activities or provide baby-sitting services while on Pain, Sleep and Anxiety Medications; until their outpatient Physician has advised to do so again. Also recommended to not to take  more than prescribed Pain, Sleep and Anxiety Medications.  Consultants:  Nephrology Procedures performed: Hemodialysis Disposition: Skilled nursing facility Diet recommendation:  Discharge Diet Orders (From admission, onward)     Start     Ordered   02/04/24 0000  Diet - low sodium heart healthy        02/04/24 1306           Renal diet DISCHARGE MEDICATION: Allergies as of 02/04/2024       Reactions   Contrast Media [iodinated Contrast Media] Shortness Of Breath, Other (See Comments)   Difficulty breathing and altered mental status   Ivp Dye [iodinated Contrast Media] Anaphylaxis, Shortness Of Breath, Other (See Comments)   Breathing problems, altered mental state   Latex Rash, Other (See Comments)   A severe rash appears after the first 24 hours of being placed   Tape Other (See Comments), Rash   Rash after 1 day of use Do not leave on longer then 3 days with out be changed        Medication List     STOP taking these medications    Icosapent  Ethyl 0.5 g Caps   liver oil-zinc  oxide 40 % ointment Commonly known as: DESITIN   midodrine  10 MG tablet Commonly known as: PROAMATINE        TAKE these medications    amiodarone  200 MG tablet Commonly known as: PACERONE  Take 0.5 tablets (100 mg total) by mouth daily.   amLODipine  10 MG tablet Commonly known as: NORVASC  Take 10 mg by mouth in the morning.   apixaban  5 MG Tabs tablet Commonly known as: ELIQUIS  Take 1 tablet (5 mg total) by mouth 2 (two) times daily.   atorvastatin  80 MG tablet Commonly known as: LIPITOR Take 1 tablet (80 mg total) by mouth at bedtime.   carvedilol  3.125 MG tablet Commonly known as: Coreg  Take 1 tablet (3.125 mg total) by mouth 2 (two) times daily with a meal. What changed:  when to take this additional instructions   clopidogrel  75 MG tablet Commonly known as: PLAVIX  Take 1 tablet (75 mg total) by mouth daily with breakfast.   Dialyvite /Zinc  Tabs Take 1 tablet by mouth Every Tuesday,Thursday,and Saturday with dialysis.   famotidine  20  MG tablet Commonly known as: PEPCID  Take 20 mg by mouth every other day.   fentaNYL  50 MCG/HR Commonly known as: DURAGESIC  Place 1 patch onto the skin every 3 (three) days.   folic acid  1 MG tablet Commonly known as: FOLVITE  Take 1 mg by mouth in the morning.   gabapentin  100 MG capsule Commonly known as: NEURONTIN  Take 100 mg by mouth 3 (three) times daily.   hydrALAZINE  50 MG tablet Commonly known as: APRESOLINE  Take 50 mg by mouth every 8 (eight) hours.   isosorbide  mononitrate 60 MG 24 hr tablet Commonly known as: IMDUR  Take 1 tablet (60 mg total) by mouth daily.   lidocaine -prilocaine  cream Commonly known as: EMLA  Apply 1 application topically See admin instructions. Prior to Dialysis days Tuesday,Thursday and saturday   loratadine  10 MG tablet Commonly known as: CLARITIN  Take 10 mg by mouth in the morning.   magnesium  oxide 400 (240 Mg) MG tablet Commonly known as: MAG-OX Take 1 tablet (400 mg total) by mouth daily.   mineral oil-hydrophilic petrolatum ointment Apply 1 Application topically as needed for dry skin or irritation (Upper thigh).   nitroGLYCERIN  0.4 MG SL tablet Commonly known as: NITROSTAT  Place 0.4 mg under the tongue every 5 (five) minutes as needed  for chest pain.   NovoLOG  FlexPen 100 UNIT/ML FlexPen Generic drug: insulin  aspart Inject 0-12 Units into the skin See admin instructions. Inject 4 units into the skin with meals in addition to sliding scale as needed. Inject as per sliding scale: If 0 - 69 = 0 initiate hypoglycemia orders;  70 - 150 = 0;  151 - 200 = 2;  201 - 250 = 4;  251 - 300 = 6;  301 - 350 = 8;  351 - 400 = 10;  401 - 450 = 12.  If >400, give 12 units, recheck in 1 hour. If still >400, call MD/NP for further orders. Subcutaneously with meals in addition to scheduled 4 untes with meals.   oxyCODONE -acetaminophen  5-325 MG tablet Commonly known as: PERCOCET /ROXICET Take 1 tablet by mouth every 8 (eight) hours as needed for  severe pain.   promethazine  12.5 MG suppository Commonly known as: PHENERGAN  Place 12.5 mg rectally daily as needed for nausea or vomiting.   rOPINIRole  2 MG tablet Commonly known as: REQUIP  Take 2 mg by mouth at bedtime.   sevelamer  carbonate 800 MG tablet Commonly known as: RENVELA  Take 2 tablets (1,600 mg total) by mouth 3 (three) times daily with meals.   tamsulosin  0.4 MG Caps capsule Commonly known as: FLOMAX  Take 0.4 mg by mouth in the morning.   vancomycin  125 MG capsule Commonly known as: VANCOCIN  Take 1 capsule (125 mg total) by mouth 4 (four) times daily for 7 days.               Discharge Care Instructions  (From admission, onward)           Start     Ordered   02/04/24 0000  Discharge wound care:       Comments: Apply gel dressing to the affected area and change position frequently   02/04/24 1306            Follow-up Information     Aloha Arnold, PA-C. Schedule an appointment as soon as possible for a visit.   Specialty: Physician Assistant Contact information: [address] [place] [address] [phone]                Discharge Exam: Cle[place]on Curls Weights   02/01/24 1840 02/04/24 0830 02/04/24 1230  Weight: 99.4 kg 121.9 kg 116.8 kg   General.  Obese gentleman, in no acute distress. Pulmonary.  Lungs clear bilaterally, normal respiratory effort. CV.  Regular rate and rhythm, no JVD, rub or murmur. Abdomen.  Soft, nontender, nondistended, BS positive. CNS.  Alert and oriented .  No focal neurologic deficit. Extremities.  Bilateral BKA Psychiatry.  Judgment and insight appears normal.   Condition at discharge: stable  The results of significant diagnostics from this hospitalization (including imaging, microbiology, ancillary and laboratory) are listed below for reference.   Imaging Studies: CT ABDOMEN PELVIS WO CONTRAST Result Date: 01/31/2024 CLINICAL DATA:  Right upper quadrant pain diarrhea EXAM: CT ABDOMEN AND  PELVIS WITHOUT CONTRAST TECHNIQUE: Multidetector CT imaging of the abdomen and pelvis was performed following the standard protocol without IV contrast. RADIATION DOSE REDUCTION: This exam was performed according to the departmental dose-optimization program which includes automated exposure control, adjustment of the mA and/or kV according to patient size and/or use of iterative reconstruction technique. COMPARISON:  CT 07/18/2023 FINDINGS: Lower chest: Lung bases demonstrate mild dependent atelectasis. Trace pleural effusions. Cardiomegaly with coronary vascular calcification. Aortic valvular calcification. Mitral calcification. Hepatobiliary: Cholecystectomy. No focal hepatic abnormality or biliary  dilatation. Pancreas: No inflammation. Atrophic. Stable cystic lesion at the body of the pancreas measuring about 2.0 by 1.8 cm on series 3, image 31. Spleen: Normal in size without focal abnormality. Adrenals/Urinary Tract: Adrenal glands are normal. Atrophic kidneys without hydronephrosis. Urinary bladder is slightly thick walled with mild perivesical stranding Stomach/Bowel: Stomach nonenlarged. No dilated small bowel. Diffuse colon wall thickening with mild pericolonic stranding at the ascending colon and descending and rectosigmoid colon. Postsurgical changes at the sigmoid colon. Circumferential wall thickening at the rectum. Vascular/Lymphatic: Advanced aortic atherosclerosis without aneurysm. Multiple small retroperitoneal lymph nodes, all subcentimeter. Reproductive: Negative prostate Other: Negative for pelvic effusion or free air. Some perirectal stranding. Musculoskeletal: Spinal fusion hardware L4 through S1. No acute osseous abnormality IMPRESSION: 1. Findings consistent with diffuse proctocolitis likely infectious (? C diff) or inflammatory. No evidence for perforation. 2. Mild bladder wall thickening with perivesical stranding, correlate with urinalysis to exclude cystitis. 3. Cardiomegaly with trace  pleural effusions. 4. Atrophic kidneys. 5. Stable small cystic pancreatic lesion. 6. Aortic atherosclerosis. Aortic Atherosclerosis (ICD10-I70.0). Electronically Signed   By: Esmeralda Hedge M.D.   On: 01/31/2024 21:05    Microbiology: Results for orders placed or performed during the hospital encounter of 01/31/24  C Difficile Quick Screen w PCR reflex     Status: Abnormal   Collection Time: 02/01/24  3:40 AM   Specimen: STOOL  Result Value Ref Range Status   C Diff antigen POSITIVE (A) NEGATIVE Final   C Diff toxin POSITIVE (A) NEGATIVE Final   C Diff interpretation Toxin producing C. difficile detected.  Final    Comment: CRITICAL RESULTS CALLED TO, READ BACK BY AND VERIFIED WITH RN K.HAMBRICK ON 02/01/24 AT 0425 BY NM Performed at [facility], [address], [place], [place] [address]   Gastrointestinal Panel by PCR , Stool     Status: None   Collection Time: 02/01/24  3:40 AM   Specimen: STOOL  Result Value Ref Range Status   Campylobacter species NOT DETECTED NOT DETECTED Final   Plesimonas shigelloides NOT DETECTED NOT DETECTED Final   Salmonella species NOT DETECTED NOT DETECTED Final   Yersinia enterocolitica NOT DETECTED NOT DETECTED Final   Vibrio species NOT DETECTED NOT DETECTED Final   Vibrio cholerae NOT DETECTED NOT DETECTED Final   Enteroaggregative E coli (EAEC) NOT DETECTED NOT DETECTED Final   Enteropathogenic E coli (EPEC) NOT DETECTED NOT DETECTED Final   Enterotoxigenic E coli (ETEC) NOT DETECTED NOT DETECTED Final   Shiga like toxin producing E coli (STEC) NOT DETECTED NOT DETECTED Final   Shigella/Enteroinvasive E coli (EIEC) NOT DETECTED NOT DETECTED Final   Cryptosporidium NOT DETECTED NOT DETECTED Final   Cyclospora cayetanensis NOT DETECTED NOT DETECTED Final   Entamoeba histolytica NOT DETECTED NOT DETECTED Final   Giardia lamblia NOT DETECTED NOT DETECTED Final   Adenovirus F40/41 NOT DETECTED NOT DETECTED Final   Astrovirus NOT DETECTED NOT  DETECTED Final   Norovirus GI/GII NOT DETECTED NOT DETECTED Final   Rotavirus A NOT DETECTED NOT DETECTED Final   Sapovirus (I, II, IV, and V) NOT DETECTED NOT DETECTED Final    Comment: Performed at [facility], [address] [place], [place] [address]   *Note: Due to a large number of results and/or encounters for the requested time period, some results have not been displayed. A complete set of results can be found in Results Review.    Labs: CBC: Recent Labs  Lab 01/31/24 1822 02/01/24 1119 02/02/24 0981  WBC 10.1 11.5* 15.3*  NEUTROABS 8.2*  --   --   HGB 9.5* 10.5* 10.8*  HCT 29.4* 32.0* 33.6*  MCV 92.2 89.9 89.8  PLT 154 162 183   Basic Metabolic Panel: Recent Labs  Lab 01/31/24 1822 02/01/24 1119 02/02/24 0752  NA 138 138 138  K 3.8 4.6 4.0  CL 106 102 100  CO2 15* 14* 20*  GLUCOSE 160* 106* 109*  BUN 89* 104* 68*  CREATININE 10.65* 12.57* 9.62*  CALCIUM  5.8* 6.8* 7.2*  MG 1.3*  --   --   PHOS 9.2* 10.9*  --    Liver Function Tests: Recent Labs  Lab 01/31/24 1822 02/01/24 1119 02/02/24 0752  AST 17  --  12*  ALT 11  --  11  ALKPHOS 88  --  92  BILITOT 0.6  --  1.2  PROT 4.6*  --  5.0*  ALBUMIN  2.3* 2.5* 2.3*   CBG: Recent Labs  Lab 02/02/24 2114 02/03/24 0756 02/03/24 1229 02/03/24 1614 02/03/24 2017  GLUCAP 156* 152* 153* 160* 167*    Discharge time spent: greater than 30 minutes.  This record has been created using Conservation officer, historic buildings. Errors have been sought and corrected,but may not always be located. Such creation errors do not reflect on the standard of care.   Signed: Luna Salinas, MD [facility] 02/04/2024

## 2024-02-04 NOTE — Progress Notes (Signed)
  [facility] Progress Note  Subjective:  Pt seen in HD unit Wants to pull more in HD like 4-5 L  Pt w/o complaints  Vitals:   02/04/24 0941 02/04/24 1000 02/04/24 1030 02/04/24 1100  BP:  (!) 154/83 (!) 195/78 (!) 169/77  Pulse: 95 (!) 57 (!) 109 99  Resp: (!) 22 (!) 22 17 (!) 23  Temp:      TempSrc:      SpO2: 100% 100% 100% 100%  Weight:        Exam: General: chronically ill appearing, no distress Heart: RRR , no RG Lungs: dec BS at bases Abdomen: obese, distended, tender Extremities: bilat BKA w/ 1-2+ bilat stump edema Dialysis Access: LUA AVF+bruit    Renal-related home meds: Midodrine  10mg  prn  Norvasc  10 every day Hydralazine  50 tid  Coreg  3.125 mg as directed     OP HD: [facility]  4h   [id number]   107kg   AVF  Heparin  none Mircera 225 mcg last given 5/3 Hectorol  2mcg IV three times per week   Assessment/ Plan: C-diff colitis (hx c-diff 09/2023): on PO Vanc,  per pmd  ESRD:  on HD TTS. Last OP HD 5/15.  HD here 5/24, next HD tomorrow.  BP: bp's still high, on coreg , norvasc  and hydralazine  Volume: on RA, sig bilat stump edema, max UF w/ HD today Anemia of CKD - Hgb 10.5, no esa needs for now. Follow.  Secondary Hyperparathyroidism -  Corr Ca low and phos high. Getting binders and VDRA. Ordered 3Ca bath w/ HD.  Nutrition - renal diet with fluid restriction  Harold Poag MD  [facility] 02/04/2024, 11:02 AM  Recent Labs  Lab 01/31/24 1822 02/01/24 1119 02/02/24 0752  HGB 9.5* 10.5* 10.8*  ALBUMIN  2.3* 2.5* 2.3*  CALCIUM  5.8* 6.8* 7.2*  PHOS 9.2* 10.9*  --   CREATININE 10.65* 12.57* 9.62*  K 3.8 4.6 4.0   No results for input(s): "IRON", "TIBC", "FERRITIN" in the last 168 hours. Inpatient medications:  amLODipine   10 mg Oral Daily   apixaban   5 mg Oral BID   atorvastatin   80 mg Oral QHS   carvedilol   3.125 mg Oral 2 times per day on Sunday Monday Wednesday Friday   carvedilol   3.125 mg Oral Once per day on Tuesday Thursday Saturday    Chlorhexidine  Gluconate Cloth  6 each Topical Q0600   clopidogrel   75 mg Oral Q breakfast   doxercalciferol   2 mcg Intravenous Q T,Th,Sa-HD   famotidine   20 mg Oral QODAY   fentaNYL   1 patch Transdermal Q72H   folic acid   1 mg Oral q AM   hydrALAZINE   50 mg Oral Q8H   insulin  aspart  0-5 Units Subcutaneous QHS   insulin  aspart  0-6 Units Subcutaneous TID WC   magnesium  oxide  400 mg Oral Daily   sevelamer  carbonate  1,600 mg Oral TID WC   tamsulosin   0.4 mg Oral q AM   vancomycin   125 mg Oral QID    acetaminophen  **OR** acetaminophen 

## 2024-02-04 NOTE — Discharge Planning (Signed)
 [facility] Patient Discharge Orders- [facility] CLINIC: [facility]  Patient's name: [name] Admit/DC Dates: 01/31/2024 - 02/04/2024  Discharge Diagnoses: C. Diff colitis     Aranesp : Given: no   Date and amount of last dose: N/A  Last Hgb: 10.8 PRBC's Given: no Date/# of units: N/A ESA dose for discharge: mircera 225 mcg IV q 2 weeks  IV Iron dose at discharge: none  Heparin  change: no  EDW Change: no New EDW:   Bath Change: no  Access intervention/Change: no Details:  Hectorol /Calcitriol change: no  Discharge Labs: Calcium  7.2 Phosphorus 10.9 Albumin  2.3 K+ 4.0  IV Antibiotics: no Details:  On Coumadin?: no Last INR: Next INR: Managed By:   OTHER/APPTS/LAB ORDERS:    D/C Meds to be reconciled by nurse after every discharge.  Completed By: Ramona Burner, PA-C 02/04/2024, 8:37 PM  [facility] Pager: [phone]    Reviewed by: MD:______ RN_______

## 2024-02-04 NOTE — TOC Transition Note (Signed)
 Transition of Care [facility]) - Discharge Note   Patient Details  Name: [name] MRN: [id number] Date of Birth: December 10, 1955  Transition of Care [facility]) CM/SW Contact:  Reiss Mowrey A [place], LCSW Phone Number: 02/04/2024, 4:34 PM   Clinical Narrative:     Patient will DC to: [facility]  Anticipated DC date: 02/04/24  Family notified: Emory Harps  Transport by: Lyna Sandhoff      Per MD patient ready for DC to [facility]. RN, patient, patient's family, and facility notified of DC. Discharge Summary and FL2 sent to facility. RN to call report prior to discharge [phone]). DC packet on chart. Ambulance transport requested for patient. CSW was notified pt can return with Cdiff  to [facility]. Renal navigator Lauraine Polite reached out to [facility] and said they have been made aware of pt's cdiff and are prepared to resume care on Thursday.      CSW will sign off for now as social work intervention is no longer needed. Please consult us  again if new needs arise.   Final next level of care: Skilled Nursing Facility Barriers to Discharge: Barriers Resolved   Patient Goals and CMS Choice            Discharge Placement              Patient chooses bed at: [facility] Patient to be transferred to facility by: [facility] Name of family member notified: Leotha Rang Patient and family notified of of transfer: 02/04/24  Discharge Plan and Services Additional resources added to the After Visit Summary for                                       Social Drivers of Health (SDOH) Interventions SDOH Screenings   Food Insecurity: No Food Insecurity (02/02/2024)  Housing: Low Risk  (02/02/2024)  Transportation Needs: No Transportation Needs (02/02/2024)  Utilities: Not At Risk (02/02/2024)  Social Connections: Moderately Isolated (02/02/2024)  Tobacco Use: High Risk (02/04/2024)     Readmission Risk Interventions    05/29/2023    4:41 PM 06/30/2021   11:53 AM  Readmission Risk  Prevention Plan  Transportation Screening Complete Complete  Medication Review Oceanographer) Complete Complete  PCP or Specialist appointment within 3-5 days of discharge Complete   HRI or Home Care Consult Complete Complete  SW Recovery Care/Counseling Consult Complete Complete  Palliative Care Screening Not Applicable Not Applicable  Skilled Nursing Facility Complete Not Applicable

## 2024-02-04 NOTE — Progress Notes (Signed)
 Called [facility] and spoke to [place], Charity fundraiser. Gave report to [name], all questions were answered.   1611- Was notified that the pt was able to go continue with discharge and going to HD would not be an issue. Paperwork is completed and ready to go when [facility] arrives

## 2024-02-04 NOTE — Plan of Care (Signed)

## 2024-02-05 DIAGNOSIS — A0472 Enterocolitis due to Clostridium difficile, not specified as recurrent: Secondary | ICD-10-CM | POA: Diagnosis not present

## 2024-02-05 DIAGNOSIS — I10 Essential (primary) hypertension: Secondary | ICD-10-CM | POA: Diagnosis not present

## 2024-02-08 DIAGNOSIS — N186 End stage renal disease: Secondary | ICD-10-CM | POA: Diagnosis not present

## 2024-02-08 DIAGNOSIS — E1122 Type 2 diabetes mellitus with diabetic chronic kidney disease: Secondary | ICD-10-CM | POA: Diagnosis not present

## 2024-02-08 DIAGNOSIS — Z992 Dependence on renal dialysis: Secondary | ICD-10-CM | POA: Diagnosis not present

## 2024-02-09 DIAGNOSIS — E11628 Type 2 diabetes mellitus with other skin complications: Secondary | ICD-10-CM | POA: Diagnosis not present

## 2024-02-09 DIAGNOSIS — Y835 Amputation of limb(s) as the cause of abnormal reaction of the patient, or of later complication, without mention of misadventure at the time of the procedure: Secondary | ICD-10-CM | POA: Diagnosis not present

## 2024-02-09 DIAGNOSIS — M6281 Muscle weakness (generalized): Secondary | ICD-10-CM | POA: Diagnosis not present

## 2024-02-09 DIAGNOSIS — Z993 Dependence on wheelchair: Secondary | ICD-10-CM | POA: Diagnosis not present

## 2024-02-09 DIAGNOSIS — R293 Abnormal posture: Secondary | ICD-10-CM | POA: Diagnosis not present

## 2024-02-09 DIAGNOSIS — Z89511 Acquired absence of right leg below knee: Secondary | ICD-10-CM | POA: Diagnosis not present

## 2024-02-09 DIAGNOSIS — Z89512 Acquired absence of left leg below knee: Secondary | ICD-10-CM | POA: Diagnosis not present

## 2024-02-11 ENCOUNTER — Emergency Department (HOSPITAL_COMMUNITY)

## 2024-02-11 ENCOUNTER — Encounter (HOSPITAL_COMMUNITY): Payer: Self-pay

## 2024-02-11 ENCOUNTER — Other Ambulatory Visit: Payer: Self-pay

## 2024-02-11 ENCOUNTER — Inpatient Hospital Stay (HOSPITAL_COMMUNITY)
Admission: EM | Admit: 2024-02-11 | Discharge: 2024-02-21 | DRG: 871 | Disposition: A | Discharge status: Skilled Nursing Facility | Source: Ambulatory Visit | Attending: Critical Care Medicine | Admitting: Critical Care Medicine

## 2024-02-11 DIAGNOSIS — E119 Type 2 diabetes mellitus without complications: Secondary | ICD-10-CM

## 2024-02-11 DIAGNOSIS — K219 Gastro-esophageal reflux disease without esophagitis: Secondary | ICD-10-CM | POA: Diagnosis not present

## 2024-02-11 DIAGNOSIS — J9811 Atelectasis: Secondary | ICD-10-CM | POA: Diagnosis not present

## 2024-02-11 DIAGNOSIS — K2270 Barrett's esophagus without dysplasia: Secondary | ICD-10-CM | POA: Diagnosis present

## 2024-02-11 DIAGNOSIS — R6521 Severe sepsis with septic shock: Secondary | ICD-10-CM | POA: Diagnosis present

## 2024-02-11 DIAGNOSIS — M19071 Primary osteoarthritis, right ankle and foot: Secondary | ICD-10-CM | POA: Diagnosis present

## 2024-02-11 DIAGNOSIS — G2581 Restless legs syndrome: Secondary | ICD-10-CM | POA: Diagnosis not present

## 2024-02-11 DIAGNOSIS — J440 Chronic obstructive pulmonary disease with acute lower respiratory infection: Secondary | ICD-10-CM | POA: Diagnosis not present

## 2024-02-11 DIAGNOSIS — N186 End stage renal disease: Secondary | ICD-10-CM | POA: Diagnosis not present

## 2024-02-11 DIAGNOSIS — R9431 Abnormal electrocardiogram [ECG] [EKG]: Secondary | ICD-10-CM | POA: Diagnosis not present

## 2024-02-11 DIAGNOSIS — R0902 Hypoxemia: Secondary | ICD-10-CM | POA: Diagnosis not present

## 2024-02-11 DIAGNOSIS — D631 Anemia in chronic kidney disease: Secondary | ICD-10-CM | POA: Diagnosis present

## 2024-02-11 DIAGNOSIS — R0989 Other specified symptoms and signs involving the circulatory and respiratory systems: Secondary | ICD-10-CM | POA: Diagnosis not present

## 2024-02-11 DIAGNOSIS — N2581 Secondary hyperparathyroidism of renal origin: Secondary | ICD-10-CM | POA: Diagnosis not present

## 2024-02-11 DIAGNOSIS — I5032 Chronic diastolic (congestive) heart failure: Secondary | ICD-10-CM | POA: Diagnosis not present

## 2024-02-11 DIAGNOSIS — R570 Cardiogenic shock: Secondary | ICD-10-CM | POA: Diagnosis present

## 2024-02-11 DIAGNOSIS — Z7401 Bed confinement status: Secondary | ICD-10-CM | POA: Diagnosis not present

## 2024-02-11 DIAGNOSIS — Z7902 Long term (current) use of antithrombotics/antiplatelets: Secondary | ICD-10-CM

## 2024-02-11 DIAGNOSIS — K5730 Diverticulosis of large intestine without perforation or abscess without bleeding: Secondary | ICD-10-CM | POA: Diagnosis not present

## 2024-02-11 DIAGNOSIS — A0472 Enterocolitis due to Clostridium difficile, not specified as recurrent: Secondary | ICD-10-CM | POA: Diagnosis not present

## 2024-02-11 DIAGNOSIS — Z8249 Family history of ischemic heart disease and other diseases of the circulatory system: Secondary | ICD-10-CM

## 2024-02-11 DIAGNOSIS — Z89512 Acquired absence of left leg below knee: Secondary | ICD-10-CM

## 2024-02-11 DIAGNOSIS — J811 Chronic pulmonary edema: Secondary | ICD-10-CM | POA: Diagnosis not present

## 2024-02-11 DIAGNOSIS — R918 Other nonspecific abnormal finding of lung field: Secondary | ICD-10-CM | POA: Diagnosis not present

## 2024-02-11 DIAGNOSIS — R911 Solitary pulmonary nodule: Secondary | ICD-10-CM | POA: Diagnosis present

## 2024-02-11 DIAGNOSIS — I452 Bifascicular block: Secondary | ICD-10-CM | POA: Diagnosis not present

## 2024-02-11 DIAGNOSIS — Z83438 Family history of other disorder of lipoprotein metabolism and other lipidemia: Secondary | ICD-10-CM

## 2024-02-11 DIAGNOSIS — E8721 Acute metabolic acidosis: Secondary | ICD-10-CM | POA: Diagnosis present

## 2024-02-11 DIAGNOSIS — E1151 Type 2 diabetes mellitus with diabetic peripheral angiopathy without gangrene: Secondary | ICD-10-CM | POA: Diagnosis present

## 2024-02-11 DIAGNOSIS — A419 Sepsis, unspecified organism: Principal | ICD-10-CM | POA: Diagnosis present

## 2024-02-11 DIAGNOSIS — Z7901 Long term (current) use of anticoagulants: Secondary | ICD-10-CM

## 2024-02-11 DIAGNOSIS — I517 Cardiomegaly: Secondary | ICD-10-CM | POA: Diagnosis not present

## 2024-02-11 DIAGNOSIS — Z89511 Acquired absence of right leg below knee: Secondary | ICD-10-CM

## 2024-02-11 DIAGNOSIS — E875 Hyperkalemia: Secondary | ICD-10-CM | POA: Diagnosis present

## 2024-02-11 DIAGNOSIS — G8929 Other chronic pain: Secondary | ICD-10-CM | POA: Diagnosis present

## 2024-02-11 DIAGNOSIS — Z8261 Family history of arthritis: Secondary | ICD-10-CM

## 2024-02-11 DIAGNOSIS — M19041 Primary osteoarthritis, right hand: Secondary | ICD-10-CM | POA: Diagnosis present

## 2024-02-11 DIAGNOSIS — E860 Dehydration: Secondary | ICD-10-CM | POA: Diagnosis not present

## 2024-02-11 DIAGNOSIS — E1122 Type 2 diabetes mellitus with diabetic chronic kidney disease: Secondary | ICD-10-CM | POA: Diagnosis not present

## 2024-02-11 DIAGNOSIS — N189 Chronic kidney disease, unspecified: Secondary | ICD-10-CM | POA: Diagnosis present

## 2024-02-11 DIAGNOSIS — J449 Chronic obstructive pulmonary disease, unspecified: Secondary | ICD-10-CM | POA: Diagnosis not present

## 2024-02-11 DIAGNOSIS — I495 Sick sinus syndrome: Secondary | ICD-10-CM | POA: Diagnosis not present

## 2024-02-11 DIAGNOSIS — K760 Fatty (change of) liver, not elsewhere classified: Secondary | ICD-10-CM | POA: Diagnosis present

## 2024-02-11 DIAGNOSIS — M19042 Primary osteoarthritis, left hand: Secondary | ICD-10-CM | POA: Diagnosis present

## 2024-02-11 DIAGNOSIS — I4891 Unspecified atrial fibrillation: Secondary | ICD-10-CM | POA: Diagnosis not present

## 2024-02-11 DIAGNOSIS — Z743 Need for continuous supervision: Secondary | ICD-10-CM | POA: Diagnosis not present

## 2024-02-11 DIAGNOSIS — D72829 Elevated white blood cell count, unspecified: Secondary | ICD-10-CM | POA: Diagnosis not present

## 2024-02-11 DIAGNOSIS — E872 Acidosis, unspecified: Secondary | ICD-10-CM

## 2024-02-11 DIAGNOSIS — M170 Bilateral primary osteoarthritis of knee: Secondary | ICD-10-CM | POA: Diagnosis present

## 2024-02-11 DIAGNOSIS — M47812 Spondylosis without myelopathy or radiculopathy, cervical region: Secondary | ICD-10-CM | POA: Diagnosis present

## 2024-02-11 DIAGNOSIS — J189 Pneumonia, unspecified organism: Secondary | ICD-10-CM | POA: Diagnosis not present

## 2024-02-11 DIAGNOSIS — J9601 Acute respiratory failure with hypoxia: Secondary | ICD-10-CM | POA: Diagnosis not present

## 2024-02-11 DIAGNOSIS — N281 Cyst of kidney, acquired: Secondary | ICD-10-CM | POA: Diagnosis not present

## 2024-02-11 DIAGNOSIS — Z9109 Other allergy status, other than to drugs and biological substances: Secondary | ICD-10-CM

## 2024-02-11 DIAGNOSIS — Z8619 Personal history of other infectious and parasitic diseases: Secondary | ICD-10-CM

## 2024-02-11 DIAGNOSIS — J90 Pleural effusion, not elsewhere classified: Secondary | ICD-10-CM | POA: Diagnosis not present

## 2024-02-11 DIAGNOSIS — I2510 Atherosclerotic heart disease of native coronary artery without angina pectoris: Secondary | ICD-10-CM | POA: Diagnosis not present

## 2024-02-11 DIAGNOSIS — Z7189 Other specified counseling: Secondary | ICD-10-CM | POA: Diagnosis not present

## 2024-02-11 DIAGNOSIS — Z452 Encounter for adjustment and management of vascular access device: Secondary | ICD-10-CM | POA: Diagnosis not present

## 2024-02-11 DIAGNOSIS — I252 Old myocardial infarction: Secondary | ICD-10-CM

## 2024-02-11 DIAGNOSIS — I4892 Unspecified atrial flutter: Secondary | ICD-10-CM | POA: Diagnosis not present

## 2024-02-11 DIAGNOSIS — Z794 Long term (current) use of insulin: Secondary | ICD-10-CM

## 2024-02-11 DIAGNOSIS — I314 Cardiac tamponade: Secondary | ICD-10-CM

## 2024-02-11 DIAGNOSIS — Z833 Family history of diabetes mellitus: Secondary | ICD-10-CM

## 2024-02-11 DIAGNOSIS — Z955 Presence of coronary angioplasty implant and graft: Secondary | ICD-10-CM

## 2024-02-11 DIAGNOSIS — I3139 Other pericardial effusion (noninflammatory): Secondary | ICD-10-CM | POA: Diagnosis not present

## 2024-02-11 DIAGNOSIS — E440 Moderate protein-calorie malnutrition: Secondary | ICD-10-CM | POA: Diagnosis not present

## 2024-02-11 DIAGNOSIS — Z91041 Radiographic dye allergy status: Secondary | ICD-10-CM

## 2024-02-11 DIAGNOSIS — Z8673 Personal history of transient ischemic attack (TIA), and cerebral infarction without residual deficits: Secondary | ICD-10-CM

## 2024-02-11 DIAGNOSIS — I312 Hemopericardium, not elsewhere classified: Secondary | ICD-10-CM | POA: Diagnosis not present

## 2024-02-11 DIAGNOSIS — Z79899 Other long term (current) drug therapy: Secondary | ICD-10-CM

## 2024-02-11 DIAGNOSIS — I10 Essential (primary) hypertension: Secondary | ICD-10-CM | POA: Diagnosis not present

## 2024-02-11 DIAGNOSIS — I4430 Unspecified atrioventricular block: Secondary | ICD-10-CM | POA: Diagnosis present

## 2024-02-11 DIAGNOSIS — R001 Bradycardia, unspecified: Secondary | ICD-10-CM | POA: Diagnosis not present

## 2024-02-11 DIAGNOSIS — I739 Peripheral vascular disease, unspecified: Secondary | ICD-10-CM | POA: Diagnosis not present

## 2024-02-11 DIAGNOSIS — I953 Hypotension of hemodialysis: Secondary | ICD-10-CM | POA: Diagnosis present

## 2024-02-11 DIAGNOSIS — Z96651 Presence of right artificial knee joint: Secondary | ICD-10-CM | POA: Diagnosis present

## 2024-02-11 DIAGNOSIS — L89151 Pressure ulcer of sacral region, stage 1: Secondary | ICD-10-CM | POA: Diagnosis not present

## 2024-02-11 DIAGNOSIS — R531 Weakness: Secondary | ICD-10-CM | POA: Diagnosis not present

## 2024-02-11 DIAGNOSIS — I480 Paroxysmal atrial fibrillation: Secondary | ICD-10-CM | POA: Diagnosis not present

## 2024-02-11 DIAGNOSIS — I25118 Atherosclerotic heart disease of native coronary artery with other forms of angina pectoris: Secondary | ICD-10-CM

## 2024-02-11 DIAGNOSIS — Z91040 Latex allergy status: Secondary | ICD-10-CM

## 2024-02-11 DIAGNOSIS — Z6829 Body mass index (BMI) 29.0-29.9, adult: Secondary | ICD-10-CM

## 2024-02-11 DIAGNOSIS — R5381 Other malaise: Secondary | ICD-10-CM

## 2024-02-11 DIAGNOSIS — G4733 Obstructive sleep apnea (adult) (pediatric): Secondary | ICD-10-CM | POA: Diagnosis present

## 2024-02-11 DIAGNOSIS — I319 Disease of pericardium, unspecified: Secondary | ICD-10-CM | POA: Diagnosis not present

## 2024-02-11 DIAGNOSIS — E785 Hyperlipidemia, unspecified: Secondary | ICD-10-CM | POA: Diagnosis present

## 2024-02-11 DIAGNOSIS — R011 Cardiac murmur, unspecified: Secondary | ICD-10-CM | POA: Diagnosis present

## 2024-02-11 DIAGNOSIS — R578 Other shock: Secondary | ICD-10-CM | POA: Diagnosis not present

## 2024-02-11 DIAGNOSIS — R579 Shock, unspecified: Secondary | ICD-10-CM | POA: Diagnosis not present

## 2024-02-11 DIAGNOSIS — I959 Hypotension, unspecified: Secondary | ICD-10-CM | POA: Diagnosis not present

## 2024-02-11 DIAGNOSIS — R768 Other specified abnormal immunological findings in serum: Secondary | ICD-10-CM | POA: Diagnosis present

## 2024-02-11 DIAGNOSIS — N400 Enlarged prostate without lower urinary tract symptoms: Secondary | ICD-10-CM | POA: Diagnosis present

## 2024-02-11 DIAGNOSIS — F17290 Nicotine dependence, other tobacco product, uncomplicated: Secondary | ICD-10-CM | POA: Diagnosis present

## 2024-02-11 DIAGNOSIS — F17210 Nicotine dependence, cigarettes, uncomplicated: Secondary | ICD-10-CM | POA: Diagnosis present

## 2024-02-11 DIAGNOSIS — I120 Hypertensive chronic kidney disease with stage 5 chronic kidney disease or end stage renal disease: Secondary | ICD-10-CM | POA: Diagnosis not present

## 2024-02-11 DIAGNOSIS — Z992 Dependence on renal dialysis: Secondary | ICD-10-CM | POA: Diagnosis not present

## 2024-02-11 DIAGNOSIS — R34 Anuria and oliguria: Secondary | ICD-10-CM | POA: Diagnosis present

## 2024-02-11 DIAGNOSIS — K5900 Constipation, unspecified: Secondary | ICD-10-CM | POA: Diagnosis not present

## 2024-02-11 DIAGNOSIS — M19072 Primary osteoarthritis, left ankle and foot: Secondary | ICD-10-CM | POA: Diagnosis present

## 2024-02-11 DIAGNOSIS — E1165 Type 2 diabetes mellitus with hyperglycemia: Secondary | ICD-10-CM | POA: Diagnosis present

## 2024-02-11 DIAGNOSIS — E8729 Other acidosis: Secondary | ICD-10-CM | POA: Diagnosis not present

## 2024-02-11 DIAGNOSIS — Z4682 Encounter for fitting and adjustment of non-vascular catheter: Secondary | ICD-10-CM | POA: Diagnosis not present

## 2024-02-11 DIAGNOSIS — I509 Heart failure, unspecified: Secondary | ICD-10-CM | POA: Diagnosis not present

## 2024-02-11 DIAGNOSIS — I132 Hypertensive heart and chronic kidney disease with heart failure and with stage 5 chronic kidney disease, or end stage renal disease: Secondary | ICD-10-CM | POA: Diagnosis present

## 2024-02-11 DIAGNOSIS — Z515 Encounter for palliative care: Secondary | ICD-10-CM | POA: Diagnosis not present

## 2024-02-11 DIAGNOSIS — E1142 Type 2 diabetes mellitus with diabetic polyneuropathy: Secondary | ICD-10-CM | POA: Diagnosis present

## 2024-02-11 LAB — CBC WITH DIFFERENTIAL/PLATELET
Abs Immature Granulocytes: 0.09 10*3/uL — ABNORMAL HIGH (ref 0.00–0.07)
Basophils Absolute: 0.1 10*3/uL (ref 0.0–0.1)
Basophils Relative: 1 %
Eosinophils Absolute: 0.1 10*3/uL (ref 0.0–0.5)
Eosinophils Relative: 1 %
HCT: 34.2 % — ABNORMAL LOW (ref 39.0–52.0)
Hemoglobin: 10.5 g/dL — ABNORMAL LOW (ref 13.0–17.0)
Immature Granulocytes: 1 %
Lymphocytes Relative: 9 %
Lymphs Abs: 1.1 10*3/uL (ref 0.7–4.0)
MCH: 28.8 pg (ref 26.0–34.0)
MCHC: 30.7 g/dL (ref 30.0–36.0)
MCV: 94 fL (ref 80.0–100.0)
Monocytes Absolute: 1.1 10*3/uL — ABNORMAL HIGH (ref 0.1–1.0)
Monocytes Relative: 8 %
Neutro Abs: 10.6 10*3/uL — ABNORMAL HIGH (ref 1.7–7.7)
Neutrophils Relative %: 80 %
Platelets: 278 10*3/uL (ref 150–400)
RBC: 3.64 MIL/uL — ABNORMAL LOW (ref 4.22–5.81)
RDW: 16.1 % — ABNORMAL HIGH (ref 11.5–15.5)
WBC: 13 10*3/uL — ABNORMAL HIGH (ref 4.0–10.5)
nRBC: 0 % (ref 0.0–0.2)

## 2024-02-11 LAB — COMPREHENSIVE METABOLIC PANEL WITH GFR
ALT: 17 U/L (ref 0–44)
AST: 22 U/L (ref 15–41)
Albumin: 2.6 g/dL — ABNORMAL LOW (ref 3.5–5.0)
Alkaline Phosphatase: 82 U/L (ref 38–126)
Anion gap: 16 — ABNORMAL HIGH (ref 5–15)
BUN: 41 mg/dL — ABNORMAL HIGH (ref 8–23)
CO2: 24 mmol/L (ref 22–32)
Calcium: 8 mg/dL — ABNORMAL LOW (ref 8.9–10.3)
Chloride: 93 mmol/L — ABNORMAL LOW (ref 98–111)
Creatinine, Ser: 6.26 mg/dL — ABNORMAL HIGH (ref 0.61–1.24)
GFR, Estimated: 9 mL/min — ABNORMAL LOW (ref >60)
Glucose, Bld: 240 mg/dL — ABNORMAL HIGH (ref 70–99)
Potassium: 5 mmol/L (ref 3.5–5.1)
Sodium: 133 mmol/L — ABNORMAL LOW (ref 135–145)
Total Bilirubin: 0.8 mg/dL (ref 0.0–1.2)
Total Protein: 5.6 g/dL — ABNORMAL LOW (ref 6.5–8.1)

## 2024-02-11 LAB — I-STAT CG4 LACTIC ACID, ED
Lactic Acid, Venous: 2.4 mmol/L (ref 0.5–1.9)
Lactic Acid, Venous: 2.5 mmol/L (ref 0.5–1.9)

## 2024-02-11 LAB — TROPONIN I (HIGH SENSITIVITY)
Troponin I (High Sensitivity): 44 ng/L — ABNORMAL HIGH (ref <18)
Troponin I (High Sensitivity): 46 ng/L — ABNORMAL HIGH (ref <18)

## 2024-02-11 LAB — HEPATITIS B SURFACE ANTIGEN: Hepatitis B Surface Ag: NONREACTIVE

## 2024-02-11 LAB — PROTIME-INR
INR: 2.6 — ABNORMAL HIGH (ref 0.8–1.2)
Prothrombin Time: 27.8 s — ABNORMAL HIGH (ref 11.4–15.2)

## 2024-02-11 LAB — CBG MONITORING, ED: Glucose-Capillary: 228 mg/dL — ABNORMAL HIGH (ref 70–99)

## 2024-02-11 MED ORDER — FAMOTIDINE 20 MG PO TABS
20.0000 mg | ORAL_TABLET | ORAL | Status: DC
  Filled 2024-02-11: qty 1

## 2024-02-11 MED ORDER — SODIUM CHLORIDE 0.9 % IV BOLUS
500.0000 mL | Freq: Once | INTRAVENOUS | Status: AC
  Administered 2024-02-11: 500 mL via INTRAVENOUS

## 2024-02-11 MED ORDER — ATORVASTATIN CALCIUM 80 MG PO TABS
80.0000 mg | ORAL_TABLET | Freq: Every day | ORAL | Status: DC
  Administered 2024-02-11 – 2024-02-20 (×10): 80 mg via ORAL
  Filled 2024-02-11 (×4): qty 1
  Filled 2024-02-11: qty 2
  Filled 2024-02-11 (×5): qty 1

## 2024-02-11 MED ORDER — DIALYVITE/ZINC PO TABS: 1.0000 | ORAL_TABLET | ORAL | Status: DC

## 2024-02-11 MED ORDER — MIDODRINE HCL 5 MG PO TABS
10.0000 mg | ORAL_TABLET | ORAL | Status: AC
  Administered 2024-02-11: 10 mg via ORAL
  Filled 2024-02-11: qty 2

## 2024-02-11 MED ORDER — INSULIN ASPART 100 UNIT/ML IJ SOLN
0.0000 [IU] | Freq: Three times a day (TID) | INTRAMUSCULAR | Status: DC
  Administered 2024-02-12 (×2): 1 [IU] via SUBCUTANEOUS
  Administered 2024-02-13: 2 [IU] via SUBCUTANEOUS

## 2024-02-11 MED ORDER — MIDODRINE HCL 5 MG PO TABS: 5.0000 mg | ORAL_TABLET | Freq: Three times a day (TID) | ORAL | Status: DC

## 2024-02-11 MED ORDER — SEVELAMER CARBONATE 800 MG PO TABS
1600.0000 mg | ORAL_TABLET | Freq: Three times a day (TID) | ORAL | Status: DC
  Administered 2024-02-14 – 2024-02-21 (×18): 1600 mg via ORAL
  Filled 2024-02-11 (×19): qty 2

## 2024-02-11 MED ORDER — RENA-VITE PO TABS
1.0000 | ORAL_TABLET | ORAL | Status: DC
  Administered 2024-02-13: 1 via ORAL
  Filled 2024-02-11: qty 1

## 2024-02-11 MED ORDER — ROPINIROLE HCL 0.5 MG PO TABS
2.0000 mg | ORAL_TABLET | Freq: Every day | ORAL | Status: DC
  Administered 2024-02-11 – 2024-02-20 (×10): 2 mg via ORAL
  Filled 2024-02-11: qty 4
  Filled 2024-02-11 (×3): qty 2
  Filled 2024-02-11: qty 4
  Filled 2024-02-11 (×6): qty 2

## 2024-02-11 MED ORDER — CHLORHEXIDINE GLUCONATE CLOTH 2 % EX PADS
6.0000 | MEDICATED_PAD | Freq: Every day | CUTANEOUS | Status: DC
  Administered 2024-02-13 – 2024-02-21 (×7): 6 via TOPICAL

## 2024-02-11 MED ORDER — APIXABAN 5 MG PO TABS
5.0000 mg | ORAL_TABLET | Freq: Two times a day (BID) | ORAL | Status: DC
  Administered 2024-02-11 – 2024-02-12 (×2): 5 mg via ORAL
  Filled 2024-02-11 (×2): qty 1

## 2024-02-11 MED ORDER — TAMSULOSIN HCL 0.4 MG PO CAPS
0.4000 mg | ORAL_CAPSULE | Freq: Every morning | ORAL | Status: DC
  Administered 2024-02-12: 0.4 mg via ORAL
  Filled 2024-02-11: qty 1

## 2024-02-11 MED ORDER — CLOPIDOGREL BISULFATE 75 MG PO TABS
75.0000 mg | ORAL_TABLET | Freq: Every day | ORAL | Status: DC
  Administered 2024-02-13 – 2024-02-17 (×4): 75 mg via ORAL
  Filled 2024-02-11 (×6): qty 1

## 2024-02-11 MED ORDER — SODIUM CHLORIDE 0.9 % IV BOLUS
500.0000 mL | Freq: Once | INTRAVENOUS | Status: AC
Start: 2024-02-11 — End: 2024-02-11
  Administered 2024-02-11: 500 mL via INTRAVENOUS

## 2024-02-11 MED ORDER — MIDODRINE HCL 5 MG PO TABS
5.0000 mg | ORAL_TABLET | Freq: Once | ORAL | Status: AC
  Administered 2024-02-11: 5 mg via ORAL
  Filled 2024-02-11: qty 1

## 2024-02-11 MED ORDER — ALBUMIN HUMAN 25 % IV SOLN
25.0000 g | Freq: Four times a day (QID) | INTRAVENOUS | Status: AC
  Administered 2024-02-11 – 2024-02-12 (×2): 25 g via INTRAVENOUS
  Filled 2024-02-11 (×2): qty 100

## 2024-02-11 MED ORDER — VANCOMYCIN HCL 125 MG PO CAPS
125.0000 mg | ORAL_CAPSULE | Freq: Four times a day (QID) | ORAL | Status: DC
  Administered 2024-02-11 – 2024-02-12 (×2): 125 mg via ORAL
  Filled 2024-02-11 (×5): qty 1

## 2024-02-11 MED ORDER — INSULIN ASPART 100 UNIT/ML IJ SOLN
0.0000 [IU] | Freq: Every day | INTRAMUSCULAR | Status: DC
  Administered 2024-02-11: 2 [IU] via SUBCUTANEOUS

## 2024-02-11 NOTE — ED Notes (Signed)
 EDP made aware of pts BP. EDP at the bedside

## 2024-02-11 NOTE — ED Notes (Signed)
 Called lab to add on troponin to CMP

## 2024-02-11 NOTE — H&P (Signed)
 History and Physical  [name] ZOX:[id number] DOB: 30-Jul-1956 DOA: 02/11/2024  Referring physician: Paris Bolds, PA-EDP  PCP: Aloha Arnold, PA-C  Outpatient Specialists: Nephrology, cardiology. Patient coming from: Home from hemodialysis center.  Chief Complaint: Low blood pressure and hemodialysis.  HPI: [name] is a 68 y.o. male with medical history significant for ESRD TTS, hypertension, hyperlipidemia, type 2 diabetes, COPD, peripheral artery disease status post bilateral below the knee amputation, paroxysmal A-fib on Eliquis , coronary artery disease status post PCI with drug-eluting stent placement on 11/24/2023 on Plavix  (also on Eliquis -presumably completed 1 month of aspirin  81 mg), history of C. difficile colitis in January 2025, recently admitted on 01/31/2024 through 02/04/2024 due to diarrhea secondary to C. difficile colitis and abdominal pain, who presents to the ER via EMS from hemodialysis center due to hypotension.  His session was aborted after 1 hour due to the hypotension.  In the ER, SBP in the 80s.  EDP discussed the case with nephrology recommended admission for treatment of his hypotension and further hemodialysis.  He received 1 dose of midodrine  5 mg x 1 with improvement of his blood pressure.  Admitted by [facility], hospitalist service.  At the time of this visit, the patient is alert oriented x 4.  He denies having any nausea abdominal pain fever chills or cardiopulmonary symptoms.  Another dose of midodrine  10 mg x 1 added to maintain MAP greater than 65.  ED Course: Temperature 98.2.  BP 95/60, pulse 86, respiration rate 24, O2 saturation 100% on room air.  Lab studies notable for WBC 13.0, hemoglobin 10.5, neutrophil count 1.6.  Serum sodium 133, glucose 240, anion gap 16, serum bicarb 24.  Review of Systems: Review of systems as noted in the HPI. All other systems reviewed and are negative.   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   LEFT CAROTID ENDARTERECTOMY   Complication of anesthesia    BP WENT UP AT [facility] "   COPD (chronic obstructive pulmonary disease) (HCC)    pt denies this dx as of 06/01/20 - no inhaler    Diabetes mellitus without complication (HCC)    Diverticulitis    Diverticulosis of colon (without mention of hemorrhage)    DJD (degenerative joint disease)    knees/hands/feet/back/neck   ESRD (end stage renal disease) on dialysis (HCC)    Fatty liver    Full dentures    GERD (gastroesophageal reflux disease)    H/O hiatal hernia    History of blood transfusion    with a past surical procedure per patient 06/01/20   Hyperlipidemia    Hypertension    Neuromuscular disorder (HCC)    peripheral neuropathy   Non-pressure chronic ulcer of other part of left foot limited to breakdown of skin (HCC) 11/12/2016   Osteomyelitis (HCC)    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 Atherectomy left popliteal artery July 2012.    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   Unstable angina (HCC) 09/16/2018   Wears glasses    Past Surgical History:  Procedure Laterality Date   ABDOMINAL AORTOGRAM W/LOWER EXTREMITY N/A 06/23/2021   Procedure: ABDOMINAL AORTOGRAM W/LOWER EXTREMITY;  Surgeon: Cody Das, MD;  [facility]: [facility];  Service: Cardiovascular;  Laterality: N/A;   AMPUTATION  11/05/2011   Procedure: AMPUTATION RAY;  Surgeon: Amada Backer, MD;  Location: [facility] OR;  Service: Orthopedics;  Laterality: Right;  Amputation of Right 4&5th Toes   AMPUTATION Left 11/26/2012   Procedure: AMPUTATION RAY;  Surgeon: Amada Backer, MD;  Location: [facility] OR;  Service: Orthopedics;  Laterality: Left;  fourth ray amputation   AMPUTATION Right 08/27/2014   Procedure: Transmetatarsal Amputation;  Surgeon: Timothy Ford,  MD;  Location: [facility] OR;  Service: Orthopedics;  Laterality: Right;   AMPUTATION Right 01/14/2015   Procedure: AMPUTATION BELOW KNEE;  Surgeon: Timothy Ford, MD;  Location: [facility] OR;  Service: Orthopedics;  Laterality: Right;   AMPUTATION Left 10/21/2015   Procedure: Left Foot 5th Ray Amputation;  Surgeon: Timothy Ford, MD;  Location: [facility] OR;  Service: Orthopedics;  Laterality: Left;   AMPUTATION Left 07/04/2022   Procedure: LEFT BELOW KNEE AMPUTATION;  Surgeon: Timothy Ford, MD;  Location: [facility] OR;  Service: Orthopedics;  Laterality: Left;   ANTERIOR FUSION CERVICAL SPINE  02/06/06   C4-5, C5-6, C6-7; SURGEON DR. MAX COHEN   AV FISTULA PLACEMENT Left 06/02/2020   Procedure: ARTERIOVENOUS (AV) FISTULA CREATION LEFT;  Surgeon: Adine Hoof, MD;  Location: [facility] OR;  Service: Vascular;  Laterality: Left;   BACK SURGERY     x 3   BASCILIC VEIN TRANSPOSITION Left 07/21/2020   Procedure: LEFT UPPER ARM ATERIOVENOUS SUPERFISTULALIZATION;  Surgeon: Adine Hoof, MD;  Location: [facility] OR;  Service: Vascular;  Laterality: Left;   BELOW KNEE LEG AMPUTATION Right    CARDIAC CATHETERIZATION  10/31/04   2009   CAROTID ENDARTERECTOMY  11/10/10   CAROTID ENDARTERECTOMY Left 11/10/2010   Subtotal occlusion of left internal carotid artery with left hemispheric transient ischemic attacks.   CAROTID STENT     CARPAL TUNNEL RELEASE Right 10/21/2013   Procedure: RIGHT CARPAL TUNNEL RELEASE;  Surgeon: Kemp Patter, MD;  Location: [facility];  Service: Orthopedics;  Laterality: Right;   CHOLECYSTECTOMY     COLON SURGERY     COLONOSCOPY     COLOSTOMY REVERSAL  05/21/2018   ileostomy reversal   CORONARY LITHOTRIPSY N/A 11/24/2023   Procedure: CORONARY LITHOTRIPSY;  Surgeon: Swaziland, Peter M, MD;  Location: [facility] INVASIVE CV LAB;  Service: Cardiovascular;  Laterality: N/A;   CORONARY STENT INTERVENTION N/A 11/24/2023   Procedure: CORONARY STENT INTERVENTION;  Surgeon: Swaziland, Peter M, MD;   Location: [facility] INVASIVE CV LAB;  Service: Cardiovascular;  Laterality: N/A;   CYSTOSCOPY WITH STENT PLACEMENT Bilateral 01/13/2018   Procedure: CYSTOSCOPY WITH BILATERAL URETERAL CATHETER PLACEMENT;  Surgeon: Andrez Banker, MD;  Location: [facility] ORS;  Service: Urology;  Laterality: Bilateral;   ESOPHAGEAL MANOMETRY Bilateral 07/19/2014   Procedure: ESOPHAGEAL MANOMETRY (EM);  Surgeon: Nannette Babe, MD;  Location: [facility];  Service: Gastroenterology;  Laterality: Bilateral;   EYE SURGERY Bilateral 2020   cataract   FEMORAL ARTERY STENT     x6   FINGER SURGERY     FOOT SURGERY  04/25/2016    EXCISION BASE 5TH METATARSAL AND PARTIAL CUBOID LEFT FOOT   HERNIA REPAIR     LEFT INGUINAL AND UMBILICAL REPAIRS   HERNIA REPAIR     I & D EXTREMITY Left 04/25/2016   Procedure: EXCISION BASE 5TH METATARSAL AND PARTIAL CUBOID LEFT FOOT;  Surgeon: Timothy Ford, MD;  Location: [facility] OR;  Service: Orthopedics;  Laterality: Left;   ILEOSTOMY  01/13/2018   Procedure: ILEOSTOMY;  Surgeon: Adalberto Acton, MD;  Location: [facility] ORS;  Service: General;;   ILEOSTOMY CLOSURE N/A 05/21/2018   Procedure: ILEOSTOMY REVERSAL ERAS PATHWAY;  Surgeon: Adalberto Acton, MD;  Location: [facility] OR;  Service: General;  Laterality: N/A;   IR RADIOLOGIST EVAL & MGMT  11/19/2017   IR RADIOLOGIST EVAL & MGMT  12/03/2017   IR RADIOLOGIST EVAL & MGMT  12/18/2017   JOINT REPLACEMENT Right 2001   Total knee   LAMINECTOMY     X 3 LUMBAR AND X 2 CERVICAL SPINE OPERATIONS   LAPAROSCOPIC CHOLECYSTECTOMY W/ CHOLANGIOGRAPHY  11/09/04   SURGEON DR. Annetta Barter   LEFT HEART CATH AND CORONARY ANGIOGRAPHY N/A 09/16/2018   Procedure: LEFT HEART CATH AND CORONARY ANGIOGRAPHY;  Surgeon: Cody Das, MD;  [facility];  Service: Cardiovascular;  Laterality: N/A;   LEFT HEART CATH AND CORONARY ANGIOGRAPHY N/A 11/24/2023   Procedure: LEFT HEART CATH AND CORONARY ANGIOGRAPHY;  Surgeon: Swaziland, Peter M, MD;  Location: [facility] INVASIVE CV  LAB;  Service: Cardiovascular;  Laterality: N/A;   LEFT HEART CATHETERIZATION WITH CORONARY ANGIOGRAM N/A 10/29/2014   Procedure: LEFT HEART CATHETERIZATION WITH CORONARY ANGIOGRAM;  Surgeon: Jessica Morn, MD;  Location: [facility] CATH LAB;  Service: Cardiovascular;  Laterality: N/A;   LIGATION OF COMPETING BRANCHES OF ARTERIOVENOUS FISTULA Left 07/21/2020   Procedure: LIGATION OF COMPETING BRANCHES OF LEFT UPPER ARM ARTERIOVENOUS FISTULA;  Surgeon: Adine Hoof, MD;  Location: [facility] OR;  Service: Vascular;  Laterality: Left;   LOWER EXTREMITY ANGIOGRAM N/A 03/19/2012   Procedure: LOWER EXTREMITY ANGIOGRAM;  Surgeon: Odie Benne, MD;  Location: [facility] CATH LAB;  Service: Cardiovascular;  Laterality: N/A;   LOWER EXTREMITY ANGIOGRAPHY N/A 06/20/2021   Procedure: LOWER EXTREMITY ANGIOGRAPHY;  Surgeon: Cody Das, MD;  [facility];  Service: Cardiovascular;  Laterality: N/A;   NECK SURGERY     PARTIAL COLECTOMY N/A 01/13/2018   Procedure: LAPAROSCOPIC ASSISTED   SIGMOID COLECTOMY ILEOSTOMY;  Surgeon: Adalberto Acton, MD;  Location: [facility] ORS;  Service: General;  Laterality: N/A;   PENILE PROSTHESIS IMPLANT  08/14/05   INFRAPUBIC INSERTION OF INFLATABLE PENILE PROSTHESIS; SURGEON DR. Luster Salters   PENILE PROSTHESIS IMPLANT     PERCUTANEOUS CORONARY STENT INTERVENTION (PCI-S) Right 10/29/2014   Procedure: PERCUTANEOUS CORONARY STENT INTERVENTION (PCI-S);  Surgeon: Jessica Morn, MD;  Location: [facility] CATH LAB;  Service: Cardiovascular;  Laterality: Right;   PERIPHERAL VASCULAR INTERVENTION Left 06/23/2021   Procedure: PERIPHERAL VASCULAR INTERVENTION;  Surgeon: Cody Das, MD;  Location: [facility];  Service: Cardiovascular;  Laterality: Left;   REMOVAL OF PENILE PROSTHESIS N/A 06/14/2021   Procedure: Removal of THREE piece inflatable penile prosthesis;  Surgeon: Samson Croak, MD;  Location: [facility] OR;  Service: Urology;  Laterality: N/A;   SHOULDER  ARTHROSCOPY     SPINE SURGERY     TOE AMPUTATION Left    TONSILLECTOMY     TOTAL KNEE ARTHROPLASTY  07/2002   RIGHT KNEE ; SURGEON  DR. [name] ALSO HAD ARTHROSCOPIC RIGHT KNEE IN  10/2001   TOTAL KNEE ARTHROPLASTY     ULNAR NERVE TRANSPOSITION Right 10/21/2013   Procedure: RIGHT ELBOW  ULNAR NERVE DECOMPRESSION;  Surgeon: Kemp Patter, MD;  Location: [facility];  Service: Orthopedics;  Laterality: Right;    Social History:  reports that he has been smoking cigarettes. He has a 17.5 pack-year smoking history. He has never used smokeless tobacco. He  reports that he does not currently use alcohol . He reports that he does not use drugs.   Allergies  Allergen Reactions   Contrast Media [Iodinated Contrast Media] Shortness Of Breath and Other (See Comments)    Difficulty breathing and altered mental status     Ivp Dye [Iodinated Contrast Media] Anaphylaxis, Shortness Of Breath and Other (See Comments)    Breathing problems, altered mental state    Latex Rash and Other (See Comments)    A severe rash appears after the first 24 hours of being placed   Tape Other (See Comments) and Rash    Rash after 1 day of use  Do not leave on longer then 3 days with out be changed    Family History  Problem Relation Age of Onset   Heart disease Father        Before age 61-  CAD, BPG   Diabetes Father        Amputation   Cancer Father        PROSTATE   Hyperlipidemia Father    Hypertension Father    Heart attack Father        Triple BPG   Varicose Veins Father    Cancer Sister        Breast   Hyperlipidemia Sister    Hypertension Sister    Heart attack Brother    Colon cancer Brother    Diabetes Brother    Heart disease Brother 61       A-Fib. Before age 50   Hyperlipidemia Brother    Hypertension Brother    Hypertension Son    Arthritis Other        GRANDMOTHER   Hypertension Other        OTHER FAMILY MEMBERS      Prior to Admission medications   Medication Sig  Start Date End Date Taking? Authorizing Provider  amiodarone  (PACERONE ) 200 MG tablet Take 0.5 tablets (100 mg total) by mouth daily. Patient taking differently: Take 200 mg by mouth in the morning. 01/01/24  Yes Knox Perl, MD  amLODipine  (NORVASC ) 10 MG tablet Take 10 mg by mouth in the morning.   Yes [provider]  apixaban  (ELIQUIS ) 5 MG TABS tablet Take 1 tablet (5 mg total) by mouth 2 (two) times daily. 01/14/22 03/09/24 Yes Gonfa, Taye T, MD  atorvastatin  (LIPITOR) 80 MG tablet Take 1 tablet (80 mg total) by mouth at bedtime. 11/25/23  Yes Fay Hoop, MD  B Complex-C-Zn-Folic Acid  (DIALYVITE /ZINC ) TABS Take 1 tablet by mouth Every Tuesday,Thursday,and Saturday with dialysis. 10/04/21  Yes [provider]  carvedilol  (COREG ) 3.125 MG tablet Take 1 tablet (3.125 mg total) by mouth 2 (two) times daily with a meal. 01/01/24  Yes Knox Perl, MD  clopidogrel  (PLAVIX ) 75 MG tablet Take 1 tablet (75 mg total) by mouth daily with breakfast. 11/25/23 11/24/24 Yes Amoako, Prince, MD  famotidine  (PEPCID ) 20 MG tablet Take 20 mg by mouth every other day.   Yes [provider]  fentaNYL  (DURAGESIC ) 50 MCG/HR Place 1 patch onto the skin every 3 (three) days. 07/25/23  Yes [provider]  folic acid  (FOLVITE ) 1 MG tablet Take 1 mg by mouth in the morning. 09/18/21  Yes [provider]  gabapentin  (NEURONTIN ) 100 MG capsule Take 100 mg by mouth 3 (three) times daily.   Yes [provider]  hydrALAZINE  (APRESOLINE ) 50 MG tablet Take 50 mg by mouth every 8 (eight) hours.   Yes [provider]  insulin  aspart (NOVOLOG  FLEXPEN) 100 UNIT/ML FlexPen Inject 0-12 Units into the skin See admin instructions. Inject 4 units into the skin with meals in addition to sliding scale as needed. Inject as per sliding scale: If 0 - 69 = 0 initiate hypoglycemia orders;  70 - 150 = 0;  151 - 200 = 2;  201 - 250 = 4;  251 - 300 = 6;  301 - 350 = 8;  351 - 400 = 10;   401 - 450 = 12.  If >400, give 12 units, recheck in 1 hour. If still >400, call MD/NP for further orders. Subcutaneously with meals in addition to scheduled 4 untes with meals.   Yes [provider]  isosorbide  mononitrate (IMDUR ) 60 MG 24 hr tablet Take 1 tablet (60 mg total) by mouth daily. 01/21/23 02/11/24 Yes Knox Perl, MD  lidocaine -prilocaine  (EMLA ) cream Apply 1 application topically See admin instructions. Prior to Dialysis days Tuesday,Thursday and saturday 06/06/21  Yes [provider]  loratadine  (CLARITIN ) 10 MG tablet Take 10 mg by mouth in the morning.   Yes [provider]  magnesium  oxide (MAG-OX) 400 (240 Mg) MG tablet Take 1 tablet (400 mg total) by mouth daily. 02/04/24  Yes Amin, Sumayya, MD  mineral oil-hydrophilic petrolatum (AQUAPHOR) ointment Apply 1 Application topically as needed for dry skin or irritation (Upper thigh).   Yes [provider]  nitroGLYCERIN  (NITROSTAT ) 0.4 MG SL tablet Place 0.4 mg under the tongue every 5 (five) minutes as needed for chest pain. 01/06/22  Yes [provider]  oxyCODONE -acetaminophen  (PERCOCET /ROXICET) 5-325 MG tablet Take 1 tablet by mouth every 8 (eight) hours as needed for severe pain. 05/31/23  Yes Ghimire, Estil Heman, MD  promethazine  (PHENERGAN ) 12.5 MG suppository Place 12.5 mg rectally daily as needed for nausea or vomiting.   Yes [provider]  rOPINIRole  (REQUIP ) 2 MG tablet Take 2 mg by mouth at bedtime.   Yes [provider]  sevelamer  carbonate (RENVELA ) 800 MG tablet Take 2 tablets (1,600 mg total) by mouth 3 (three) times daily with meals. 02/04/24  Yes Luna Salinas, MD  tamsulosin  (FLOMAX ) 0.4 MG CAPS capsule Take 0.4 mg by mouth in the morning.   Yes [provider]  vancomycin  (VANCOCIN ) 125 MG capsule Take 1 capsule (125 mg total) by mouth 4 (four) times daily for 7 days. 02/04/24 02/11/24 Yes Luna Salinas, MD    Physical Exam: BP (!) 81/60 (BP Location:  Right Arm)   Pulse 80   Temp 98.2 F (36.8 C) (Oral)   Resp 12   Ht 6\' 4"  (1.93 m)   Wt 113.4 kg   SpO2 94%   BMI 30.43 kg/m   General: 68 y.o. year-old male well developed well nourished in no acute distress.  Alert and oriented x3. Cardiovascular: Regular rate and rhythm with no rubs or gallops.  No thyromegaly or JVD noted.  No lower extremity edema. 2/4 pulses in all 4 extremities. Respiratory: Clear to auscultation with no wheezes or rales. Good inspiratory effort. Abdomen: Soft nontender nondistended with normal bowel sounds x4 quadrants. Muskuloskeletal: No cyanosis, clubbing or edema noted bilaterally.  Bilateral below the knee amputation. Neuro: CN II-XII intact, strength, sensation, reflexes Skin: No ulcerative lesions noted or rashes Psychiatry: Judgement and insight appear normal. Mood is appropriate for condition and setting          Labs on Admission:  Basic Metabolic Panel: Recent Labs  Lab 02/11/24 1445  NA 133*  K 5.0  CL 93*  CO2 24  GLUCOSE 240*  BUN 41*  CREATININE 6.26*  CALCIUM  8.0*   Liver Function Tests: Recent Labs  Lab 02/11/24 1445  AST 22  ALT 17  ALKPHOS 82  BILITOT 0.8  PROT 5.6*  ALBUMIN  2.6*   No results for input(s): "LIPASE", "AMYLASE" in the last 168 hours. No results for input(s): "AMMONIA" in the last 168 hours. CBC: Recent Labs  Lab 02/11/24 1445  WBC 13.0*  NEUTROABS 10.6*  HGB 10.5*  HCT 34.2*  MCV 94.0  PLT 278   Cardiac Enzymes: No results for input(s): "CKTOTAL", "CKMB", "CKMBINDEX", "TROPONINI" in the last 168 hours.  BNP (last 3 results) No results for input(s): "BNP" in the last 8760 hours.  ProBNP (last 3 results) No results for input(s): "PROBNP" in the last 8760 hours.  CBG: No results for input(s): "GLUCAP" in the last 168 hours.  Radiological Exams on Admission: DG Chest 2 View Result Date: 02/11/2024 CLINICAL DATA:  Hypertension.  Weakness. EXAM: CHEST - 2 VIEW COMPARISON:  November 23, 2023.  FINDINGS: Stable cardiomegaly. Right lung is clear. Small left pleural effusion is noted with adjacent atelectasis. Bony thorax is unremarkable. IMPRESSION: Small left pleural effusion with adjacent atelectasis. Electronically Signed   By: Rosalene Colon M.D.   On: 02/11/2024 16:37    EKG: I independently viewed the EKG done and my findings are as followed: Atrial fibrillation rate of 91.  Nonspecific ST-T changes.  QTc 546.  Assessment/Plan Present on Admission:  Hypotension  Principal Problem:   Hypotension  Hypotension suspect multifactorial in the setting of recent C. difficile colitis Continue to hold off home oral antihypertensives Midodrine  10 mg 3 times daily IV albumin  25 g x 2 doses Resume home p.o. vancomycin  Monitor for recurrence of watery stools Maintain MAP greater than 65  ESRD on HD TTS Nephrology consulted Plan to resume hemodialysis in the inpatient setting Volume status and electrolytes managed with hemodialysis.  C. difficile colitis, POA Resume home p.o. vancomycin  Monitor for recurrence of watery stools Enteric precautions  Lactic acidosis, secondary to hypotension Lactic acid 2.5, 2.4 Received 500 cc IV fluid bolus x 2 in the ER. Continue volume repletion with IV albumin  Trend lactic acid  Leukocytosis, suspect residual from C. difficile colitis Continue to treat underlying condition Monitor fever curve and WBCs Repeat CBC in the morning  Coronary artery disease status post PCI with drug-eluting stent placement on 11/24/2023 No reported anginal symptoms Resume home Plavix  and high intensity Lipitor The patient is also on Eliquis   Paroxysmal A-fib on Eliquis  Rate is currently controlled Resume home Eliquis   Anemia of chronic disease in the setting of ESRD Hemoglobin is currently stable 10.5 Continue to monitor  Peripheral artery disease status post bilateral below the knee amputation Continue fall precautions  Type 2 diabetes with  hyperglycemia Continue insulin  coverage Renal: Modified diet  BPH Resume home Flomax  tomorrow when blood pressure is stable.  Hyperlipidemia Resume home Lipitor  Restless leg syndrome Resume home ropinirole   GERD Resume home H2 blocker   Critical care time: 55 minutes.   DVT prophylaxis: Home Eliquis .  Code Status: Full code.  Family Communication: None at bedside.  Disposition Plan: Admitted to progressive care unit.  Consults called: Nephrology.  Admission status: Inpatient status.   Status is: The patient requires at least 2 midnights for further evaluation and treatment of present condition.   Bary Boss MD [facility] Pager [phone]  If 7PM-7AM, please contact night-coverage [email or web address] Password [email or web address]  02/11/2024, 8:52 PM

## 2024-02-11 NOTE — ED Triage Notes (Signed)
 Pt bib ems from dialysis for hypotension and generalized weakness. Pt received 1 hour of dialysis when his BP dropped. 86/46 manually with ems.  Pt has been feeling week for the past week. Taking oral vanc for recent c diff infection.  Pt lives at [place]. Pt a.o, denies pain.

## 2024-02-11 NOTE — ED Notes (Signed)
 Patient aware that we need urine sample for testing, unable at this time. Pt given instruction on providing urine sample when able to do so.

## 2024-02-11 NOTE — Progress Notes (Signed)
 Informed of ESRD patient in ER. Sent in from HD unit for hypotension. Received 500cc of isotonic fluids here but no real response. Apparently patient is asymptomatic and non toxic. Outpatient HD orders: [place]. TTS. 4hrs , AVF 15g flow rates: 450/800. 2k, 2cal. EDW 107kg. UF profile #2. No heparin .  Meds: Venofer  50mg  once weekly, hectorol  2mcg qtreatment,  Recommendations: -recommend admit, hold all home BP meds, can start midodrine  5mg  BID with holding parameters (seems that this was stopped at his previous admission per review of his chart), can titrate accordingly. Plan for HD tomorrow with midodrine  support to see how he does. Full consult to follow tomorrow. Please call with any questions/concerns in the interim.  Cristi Donalds, MD [facility]

## 2024-02-11 NOTE — ED Provider Notes (Signed)
 [place] EMERGENCY DEPARTMENT AT [facility] Provider Note   CSN: [id number] Arrival date & time: 02/11/24  1426     History  Chief Complaint  Patient presents with   Weakness   Hypotension    [name] is a 68 y.o. male with past medical history of Barrett's esophagus, HFpEF, CAD, insulin -dependent T2DM, PAD, BKA x 2, COPD, HLD, ESRD on HD (TThuSat), prolonged QT, PAF, NSTEMI presents to emergency department for evaluation of generalized weakness and decreased appetite over the past week.  He also complains of chest pain and shortness of breath that started this morning however he is unable to quantify and specify pain.  Unsure whether it is exertional but he does feel it at rest. No radiation. Denies cough, congestion, fevers, known sick contacts.  He went to dialysis today and had a liter dialyzed when they noticed hypotension of 86/46 and sent him to ED via EMS for evaluation.  Of note, he was recently admitted for C. difficile. Reports diarrhea 3x a day for past week but no diarrhea today. He is currently finishing oral Vanco tomorrow.  Denies vomiting.    Weakness Associated symptoms: no abdominal pain, no chest pain, no cough, no diarrhea, no dizziness, no fever, no headaches, no nausea, no seizures, no shortness of breath and no vomiting        Home Medications Prior to Admission medications   Medication Sig Start Date End Date Taking? Authorizing Provider  amiodarone  (PACERONE ) 200 MG tablet Take 0.5 tablets (100 mg total) by mouth daily. Patient taking differently: Take 200 mg by mouth in the morning. 01/01/24  Yes Knox Perl, MD  amLODipine  (NORVASC ) 10 MG tablet Take 10 mg by mouth in the morning.   Yes [provider]  apixaban  (ELIQUIS ) 5 MG TABS tablet Take 1 tablet (5 mg total) by mouth 2 (two) times daily. 01/14/22 03/09/24 Yes Gonfa, Taye T, MD  atorvastatin  (LIPITOR) 80 MG tablet Take 1 tablet (80 mg total) by mouth at bedtime. 11/25/23  Yes  Fay Hoop, MD  B Complex-C-Zn-Folic Acid  (DIALYVITE /ZINC ) TABS Take 1 tablet by mouth Every Tuesday,Thursday,and Saturday with dialysis. 10/04/21  Yes [provider]  carvedilol  (COREG ) 3.125 MG tablet Take 1 tablet (3.125 mg total) by mouth 2 (two) times daily with a meal. 01/01/24  Yes Knox Perl, MD  clopidogrel  (PLAVIX ) 75 MG tablet Take 1 tablet (75 mg total) by mouth daily with breakfast. 11/25/23 11/24/24 Yes Amoako, Prince, MD  famotidine  (PEPCID ) 20 MG tablet Take 20 mg by mouth every other day.   Yes [provider]  fentaNYL  (DURAGESIC ) 50 MCG/HR Place 1 patch onto the skin every 3 (three) days. 07/25/23  Yes [provider]  folic acid  (FOLVITE ) 1 MG tablet Take 1 mg by mouth in the morning. 09/18/21  Yes [provider]  gabapentin  (NEURONTIN ) 100 MG capsule Take 100 mg by mouth 3 (three) times daily.   Yes [provider]  hydrALAZINE  (APRESOLINE ) 50 MG tablet Take 50 mg by mouth every 8 (eight) hours.   Yes [provider]  insulin  aspart (NOVOLOG  FLEXPEN) 100 UNIT/ML FlexPen Inject 0-12 Units into the skin See admin instructions. Inject 4 units into the skin with meals in addition to sliding scale as needed. Inject as per sliding scale: If 0 - 69 = 0 initiate hypoglycemia orders;  70 - 150 = 0;  151 - 200 = 2;  201 - 250 = 4;  251 - 300 = 6;  301 - 350 = 8;  351 - 400 = 10;  401 - 450 = 12.  If >400, give 12 units, recheck in 1 hour. If still >400, call MD/NP for further orders. Subcutaneously with meals in addition to scheduled 4 untes with meals.   Yes [provider]  isosorbide  mononitrate (IMDUR ) 60 MG 24 hr tablet Take 1 tablet (60 mg total) by mouth daily. 01/21/23 02/11/24 Yes Knox Perl, MD  lidocaine -prilocaine  (EMLA ) cream Apply 1 application topically See admin instructions. Prior to Dialysis days Tuesday,Thursday and saturday 06/06/21  Yes [provider]  loratadine  (CLARITIN ) 10 MG tablet Take 10  mg by mouth in the morning.   Yes [provider]  magnesium  oxide (MAG-OX) 400 (240 Mg) MG tablet Take 1 tablet (400 mg total) by mouth daily. 02/04/24  Yes Amin, Sumayya, MD  mineral oil-hydrophilic petrolatum (AQUAPHOR) ointment Apply 1 Application topically as needed for dry skin or irritation (Upper thigh).   Yes [provider]  nitroGLYCERIN  (NITROSTAT ) 0.4 MG SL tablet Place 0.4 mg under the tongue every 5 (five) minutes as needed for chest pain. 01/06/22  Yes [provider]  oxyCODONE -acetaminophen  (PERCOCET /ROXICET) 5-325 MG tablet Take 1 tablet by mouth every 8 (eight) hours as needed for severe pain. 05/31/23  Yes Ghimire, Estil Heman, MD  promethazine  (PHENERGAN ) 12.5 MG suppository Place 12.5 mg rectally daily as needed for nausea or vomiting.   Yes [provider]  rOPINIRole  (REQUIP ) 2 MG tablet Take 2 mg by mouth at bedtime.   Yes [provider]  sevelamer  carbonate (RENVELA ) 800 MG tablet Take 2 tablets (1,600 mg total) by mouth 3 (three) times daily with meals. 02/04/24  Yes Luna Salinas, MD  tamsulosin  (FLOMAX ) 0.4 MG CAPS capsule Take 0.4 mg by mouth in the morning.   Yes [provider]  vancomycin  (VANCOCIN ) 125 MG capsule Take 1 capsule (125 mg total) by mouth 4 (four) times daily for 7 days. 02/04/24 02/11/24 Yes Amin, Sumayya, MD      Allergies    Contrast media [iodinated contrast media], Ivp dye [iodinated contrast media], Latex, and Tape    Review of Systems   Review of Systems  Constitutional:  Negative for chills, fatigue and fever.  Respiratory:  Negative for cough, chest tightness, shortness of breath and wheezing.   Cardiovascular:  Negative for chest pain and palpitations.  Gastrointestinal:  Negative for abdominal pain, constipation, diarrhea, nausea and vomiting.  Neurological:  Positive for weakness. Negative for dizziness, seizures, light-headedness, numbness and headaches.    Physical Exam Updated Vital  Signs BP (!) 91/49   Pulse (!) 131   Temp 98.2 F (36.8 C) (Oral)   Resp 17   Ht 6\' 4"  (1.93 m)   Wt 113.4 kg   SpO2 91%   BMI 30.43 kg/m  Physical Exam Vitals and nursing note reviewed.  Constitutional:      General: He is not in acute distress.    Appearance: Normal appearance.  HENT:     Head: Normocephalic and atraumatic.  Eyes:     Conjunctiva/sclera: Conjunctivae normal.  Cardiovascular:     Rate and Rhythm: Normal rate.  Pulmonary:     Effort: Pulmonary effort is normal. No respiratory distress.  Chest:     Chest wall: No tenderness.  Abdominal:     Tenderness: There is generalized abdominal tenderness.     Hernia: A hernia is present. Hernia is present in the umbilical area.     Comments: Ecchymosis around  hernia  Musculoskeletal:     Right lower leg: 1+ Pitting Edema present.     Left lower leg: 1+ Pitting Edema present.     Comments: Fistula LUE with palpable thrill     Right Lower Extremity: Right leg is amputated below knee.     Left Lower Extremity: Left leg is amputated below knee.  Skin:    Capillary Refill: Capillary refill takes less than 2 seconds.     Coloration: Skin is not jaundiced or pale.  Neurological:     Mental Status: He is alert and oriented to person, place, and time. Mental status is at baseline.     Motor: Weakness (generalized) present.     Comments: Grip strength equal bilaterally.  No pronator drift.  Following commands appropriately.  No visual disturbances.     ED Results / Procedures / Treatments   Labs (all labs ordered are listed, but only abnormal results are displayed) Labs Reviewed  COMPREHENSIVE METABOLIC PANEL WITH GFR - Abnormal; Notable for the following components:      Result Value   Sodium 133 (*)    Chloride 93 (*)    Glucose, Bld 240 (*)    BUN 41 (*)    Creatinine, Ser 6.26 (*)    Calcium  8.0 (*)    Total Protein 5.6 (*)    Albumin  2.6 (*)    GFR, Estimated 9 (*)    Anion gap 16 (*)    All other  components within normal limits  CBC WITH DIFFERENTIAL/PLATELET - Abnormal; Notable for the following components:   WBC 13.0 (*)    RBC 3.64 (*)    Hemoglobin 10.5 (*)    HCT 34.2 (*)    RDW 16.1 (*)    Neutro Abs 10.6 (*)    Monocytes Absolute 1.1 (*)    Abs Immature Granulocytes 0.09 (*)    All other components within normal limits  PROTIME-INR - Abnormal; Notable for the following components:   Prothrombin Time 27.8 (*)    INR 2.6 (*)    All other components within normal limits  I-STAT CG4 LACTIC ACID, ED - Abnormal; Notable for the following components:   Lactic Acid, Venous 2.4 (*)    All other components within normal limits  I-STAT CG4 LACTIC ACID, ED - Abnormal; Notable for the following components:   Lactic Acid, Venous 2.5 (*)    All other components within normal limits  TROPONIN I (HIGH SENSITIVITY) - Abnormal; Notable for the following components:   Troponin I (High Sensitivity) 44 (*)    All other components within normal limits  TROPONIN I (HIGH SENSITIVITY) - Abnormal; Notable for the following components:   Troponin I (High Sensitivity) 46 (*)    All other components within normal limits  CULTURE, BLOOD (ROUTINE X 2)  CULTURE, BLOOD (ROUTINE X 2)  HEPATITIS B SURFACE ANTIGEN  URINALYSIS, W/ REFLEX TO CULTURE (INFECTION SUSPECTED)  HEPATITIS B SURFACE ANTIBODY, QUANTITATIVE  CBC  BASIC METABOLIC PANEL WITH GFR  MAGNESIUM   PHOSPHORUS  LACTIC ACID, PLASMA  LACTIC ACID, PLASMA    EKG None  Radiology DG Chest 2 View Result Date: 02/11/2024 CLINICAL DATA:  Hypertension.  Weakness. EXAM: CHEST - 2 VIEW COMPARISON:  November 23, 2023. FINDINGS: Stable cardiomegaly. Right lung is clear. Small left pleural effusion is noted with adjacent atelectasis. Bony thorax is unremarkable. IMPRESSION: Small left pleural effusion with adjacent atelectasis. Electronically Signed   By: Rosalene Colon M.D.   On: 02/11/2024 16:37  Procedures Procedures    Medications  Ordered in ED Medications  Chlorhexidine  Gluconate Cloth 2 % PADS 6 each (has no administration in time range)  apixaban  (ELIQUIS ) tablet 5 mg (has no administration in time range)  atorvastatin  (LIPITOR) tablet 80 mg (has no administration in time range)  rOPINIRole  (REQUIP ) tablet 2 mg (has no administration in time range)  tamsulosin  (FLOMAX ) capsule 0.4 mg (has no administration in time range)  sevelamer  carbonate (RENVELA ) tablet 1,600 mg (has no administration in time range)  famotidine  (PEPCID ) tablet 20 mg (has no administration in time range)  clopidogrel  (PLAVIX ) tablet 75 mg (has no administration in time range)  insulin  aspart (novoLOG ) injection 0-6 Units (has no administration in time range)  insulin  aspart (novoLOG ) injection 0-5 Units (has no administration in time range)  midodrine  (PROAMATINE ) tablet 10 mg (has no administration in time range)  vancomycin  (VANCOCIN ) capsule 125 mg (has no administration in time range)  albumin  human 25 % solution 25 g (has no administration in time range)  multivitamin (RENA-VIT) tablet 1 tablet (has no administration in time range)  sodium chloride  0.9 % bolus 500 mL (0 mLs Intravenous Stopped 02/11/24 1638)  midodrine  (PROAMATINE ) tablet 5 mg (5 mg Oral Given 02/11/24 1932)  sodium chloride  0.9 % bolus 500 mL (0 mLs Intravenous Stopped 02/11/24 2055)    ED Course/ Medical Decision Making/ A&P                                 Medical Decision Making Amount and/or Complexity of Data Reviewed Labs: ordered. Radiology: ordered.  Risk Prescription drug management. Decision regarding hospitalization.   Patient presents to the ED for concern of hypotension, this involves an extensive number of treatment options, and is a complaint that carries with it a high risk of complications and morbidity.  The differential diagnosis includes hemorrhage, sepsis, hypotension secondary to BP medications, dehydration, postdialysis treatment  hypotension   Co morbidities that complicate the patient evaluation  See HPI   Additional history obtained:  Additional history obtained from Nursing and Past Admission   External records from outside source obtained and reviewed including triage RN note, recent admission from 01/31/2024 for C. difficile colitis   Lab Tests:  I Ordered, and personally interpreted labs.  The pertinent results include:   Lactic acid 2.4 to 2.5   Imaging Studies ordered:  I ordered imaging studies including CXR  I independently visualized and interpreted imaging which showed Small left pleural effusion with adjacent atelectasis  I agree with the radiologist interpretation   Cardiac Monitoring:  The patient was maintained on a cardiac monitor.  I personally viewed and interpreted the cardiac monitored which showed an underlying rhythm of: Atrial fibrillation at 91 bpm with RBBB and LAFB per previous EKGs   Medicines ordered and prescription drug management:  I ordered medication including NS 500 cc x 2 and midodrine  for hypotension Reevaluation of the patient after these medicines showed that the patient improved I have reviewed the patients home medicines and have made adjustments as needed    Consultations Obtained:  I requested consultation with nephrology Dr. Zelda Hickman,  and discussed lab and imaging findings as well as pertinent plan - they recommend:  Midrodrine 5mg  today and obs with hospitalist admission overnight to monitor BP and likely dialysis tomorrow See his note I requested consultation with hospitalist Dr. Reesa Cannon,  and discussed lab and imaging findings as well as pertinent  plan - accepts patient for admission   Problem List / ED Course:  Hypotension Was started on amlodipine  and hydralazine  which he was taking prior as well as discontinuing midodrine  for elevated blood pressures during last admission - may be contributing to low BP Could also be due to hypovolemia  from diarrhea although diarrhea resolved today per pt Did order lactic, BC for possible sepsis. No tachycardia nor fever in ED. No cough congestion, urinary symptoms  ESRD on HD Patient has multiple vague complaints.  He complains of generalized weakness, fatigue.  Currently on oral Vanco for C. difficile.  Last dose is tomorrow per patient Does not have any infectious complaints to include cough, congestion.  Does report some diarrhea this week but much improved following oral vanc and none today Provided 500 mL NS over 2 hours to not overload patient.  Following, he does not appear short of breath with no rales on exam Was on midodrine  in past however this was discontinued following most recent admission.Provided one dose per nephrology recommendation with no improvement to BP. Provided an additional 500cc NS bolus.  Chest pain Very vague/indifferent with questioning.  Would not specify what chest pain feels like and just says "all of it". Troponin flat at 44 and 46. EKG shows A-fib with RBBB, LAFB which is similar to previous EKGs Improved since being in ED  Shortness of breath Does not appear to be in respiratory distress with no tachypnea nor hypoxia.  Maintaining saturations without oxygenation.   Chest x-ray shows small left pleural effusion.  Low suspicion for fluid overload as he did have 1 hour of dialysis today.  No significant edema to leg   Reevaluation:  After the interventions noted above, I reevaluated the patient and found that they have :improved    Dispostion:  After consideration of the diagnostic results and the patients response to treatment, I feel that the patent would benefit from admission for obs and BP management.   Discussed ED workup, dispo with pt who expresses understanding and agrees with plan  Staffed patient with Dr. Charlee Conine who reviewed ED workup and individually assessed patient alongside me Final Clinical Impression(s) / ED Diagnoses Final  diagnoses:  Hypotension, unspecified hypotension type  ESRD (end stage renal disease) on dialysis [facility])    Rx / DC Orders ED Discharge Orders     None         Royann Cords, PA 02/11/24 2223    Carin Charleston, MD 02/11/24 [phone]

## 2024-02-12 ENCOUNTER — Inpatient Hospital Stay (HOSPITAL_COMMUNITY)

## 2024-02-12 ENCOUNTER — Encounter (HOSPITAL_COMMUNITY): Payer: Self-pay | Admitting: Internal Medicine

## 2024-02-12 DIAGNOSIS — Z8619 Personal history of other infectious and parasitic diseases: Secondary | ICD-10-CM

## 2024-02-12 DIAGNOSIS — E872 Acidosis, unspecified: Secondary | ICD-10-CM

## 2024-02-12 DIAGNOSIS — R579 Shock, unspecified: Secondary | ICD-10-CM

## 2024-02-12 DIAGNOSIS — I480 Paroxysmal atrial fibrillation: Secondary | ICD-10-CM

## 2024-02-12 DIAGNOSIS — K219 Gastro-esophageal reflux disease without esophagitis: Secondary | ICD-10-CM

## 2024-02-12 DIAGNOSIS — I959 Hypotension, unspecified: Secondary | ICD-10-CM | POA: Diagnosis not present

## 2024-02-12 DIAGNOSIS — R001 Bradycardia, unspecified: Secondary | ICD-10-CM

## 2024-02-12 DIAGNOSIS — N400 Enlarged prostate without lower urinary tract symptoms: Secondary | ICD-10-CM

## 2024-02-12 DIAGNOSIS — N186 End stage renal disease: Secondary | ICD-10-CM | POA: Diagnosis not present

## 2024-02-12 DIAGNOSIS — E119 Type 2 diabetes mellitus without complications: Secondary | ICD-10-CM

## 2024-02-12 DIAGNOSIS — D631 Anemia in chronic kidney disease: Secondary | ICD-10-CM

## 2024-02-12 DIAGNOSIS — Z992 Dependence on renal dialysis: Secondary | ICD-10-CM

## 2024-02-12 DIAGNOSIS — D72829 Elevated white blood cell count, unspecified: Secondary | ICD-10-CM

## 2024-02-12 DIAGNOSIS — I739 Peripheral vascular disease, unspecified: Secondary | ICD-10-CM

## 2024-02-12 DIAGNOSIS — J449 Chronic obstructive pulmonary disease, unspecified: Secondary | ICD-10-CM

## 2024-02-12 DIAGNOSIS — E8729 Other acidosis: Secondary | ICD-10-CM

## 2024-02-12 DIAGNOSIS — A0472 Enterocolitis due to Clostridium difficile, not specified as recurrent: Secondary | ICD-10-CM

## 2024-02-12 LAB — HEPATITIS B SURFACE ANTIBODY, QUANTITATIVE: Hep B S AB Quant (Post): 16.7 m[IU]/mL

## 2024-02-12 LAB — BASIC METABOLIC PANEL WITH GFR
Anion gap: 20 — ABNORMAL HIGH (ref 5–15)
Anion gap: 23 — ABNORMAL HIGH (ref 5–15)
BUN: 52 mg/dL — ABNORMAL HIGH (ref 8–23)
BUN: 61 mg/dL — ABNORMAL HIGH (ref 8–23)
CO2: 23 mmol/L (ref 22–32)
CO2: 18 mmol/L — ABNORMAL LOW (ref 22–32)
Calcium: 8.5 mg/dL — ABNORMAL LOW (ref 8.9–10.3)
Calcium: 8.8 mg/dL — ABNORMAL LOW (ref 8.9–10.3)
Chloride: 90 mmol/L — ABNORMAL LOW (ref 98–111)
Chloride: 93 mmol/L — ABNORMAL LOW (ref 98–111)
Creatinine, Ser: 7.44 mg/dL — ABNORMAL HIGH (ref 0.61–1.24)
Creatinine, Ser: 7.85 mg/dL — ABNORMAL HIGH (ref 0.61–1.24)
GFR, Estimated: 7 mL/min — ABNORMAL LOW (ref >60)
GFR, Estimated: 7 mL/min — ABNORMAL LOW (ref >60)
Glucose, Bld: 183 mg/dL — ABNORMAL HIGH (ref 70–99)
Glucose, Bld: 201 mg/dL — ABNORMAL HIGH (ref 70–99)
Potassium: 5.8 mmol/L — ABNORMAL HIGH (ref 3.5–5.1)
Potassium: 5.7 mmol/L — ABNORMAL HIGH (ref 3.5–5.1)
Sodium: 133 mmol/L — ABNORMAL LOW (ref 135–145)
Sodium: 134 mmol/L — ABNORMAL LOW (ref 135–145)

## 2024-02-12 LAB — CBC
HCT: 33.6 % — ABNORMAL LOW (ref 39.0–52.0)
HCT: 34.7 % — ABNORMAL LOW (ref 39.0–52.0)
Hemoglobin: 10.5 g/dL — ABNORMAL LOW (ref 13.0–17.0)
Hemoglobin: 11.1 g/dL — ABNORMAL LOW (ref 13.0–17.0)
MCH: 29.4 pg (ref 26.0–34.0)
MCH: 29.3 pg (ref 26.0–34.0)
MCHC: 31.3 g/dL (ref 30.0–36.0)
MCHC: 32 g/dL (ref 30.0–36.0)
MCV: 94.1 fL (ref 80.0–100.0)
MCV: 91.6 fL (ref 80.0–100.0)
Platelets: 307 10*3/uL (ref 150–400)
Platelets: 373 10*3/uL (ref 150–400)
RBC: 3.57 MIL/uL — ABNORMAL LOW (ref 4.22–5.81)
RBC: 3.79 MIL/uL — ABNORMAL LOW (ref 4.22–5.81)
RDW: 16.5 % — ABNORMAL HIGH (ref 11.5–15.5)
RDW: 16.7 % — ABNORMAL HIGH (ref 11.5–15.5)
WBC: 16 10*3/uL — ABNORMAL HIGH (ref 4.0–10.5)
WBC: 27.8 10*3/uL — ABNORMAL HIGH (ref 4.0–10.5)
nRBC: 0 % (ref 0.0–0.2)
nRBC: 0 % (ref 0.0–0.2)

## 2024-02-12 LAB — POCT I-STAT 7, (LYTES, BLD GAS, ICA,H+H)
Acid-base deficit: 3 mmol/L — ABNORMAL HIGH (ref 0.0–2.0)
Bicarbonate: 21.9 mmol/L (ref 20.0–28.0)
Calcium, Ion: 1.06 mmol/L — ABNORMAL LOW (ref 1.15–1.40)
HCT: 34 % — ABNORMAL LOW (ref 39.0–52.0)
Hemoglobin: 11.6 g/dL — ABNORMAL LOW (ref 13.0–17.0)
O2 Saturation: 94 %
Patient temperature: 98
Potassium: 5.6 mmol/L — ABNORMAL HIGH (ref 3.5–5.1)
Sodium: 130 mmol/L — ABNORMAL LOW (ref 135–145)
TCO2: 23 mmol/L (ref 22–32)
pCO2 arterial: 36.4 mmHg (ref 32–48)
pH, Arterial: 7.387 (ref 7.35–7.45)
pO2, Arterial: 71 mmHg — ABNORMAL LOW (ref 83–108)

## 2024-02-12 LAB — CBG MONITORING, ED
Glucose-Capillary: 173 mg/dL — ABNORMAL HIGH (ref 70–99)
Glucose-Capillary: 150 mg/dL — ABNORMAL HIGH (ref 70–99)

## 2024-02-12 LAB — GLUCOSE, CAPILLARY
Glucose-Capillary: 151 mg/dL — ABNORMAL HIGH (ref 70–99)
Glucose-Capillary: 194 mg/dL — ABNORMAL HIGH (ref 70–99)
Glucose-Capillary: 200 mg/dL — ABNORMAL HIGH (ref 70–99)

## 2024-02-12 LAB — MAGNESIUM
Magnesium: 2.2 mg/dL (ref 1.7–2.4)
Magnesium: 2.1 mg/dL (ref 1.7–2.4)

## 2024-02-12 LAB — TROPONIN I (HIGH SENSITIVITY)
Troponin I (High Sensitivity): 52 ng/L — ABNORMAL HIGH (ref <18)
Troponin I (High Sensitivity): 55 ng/L — ABNORMAL HIGH (ref <18)

## 2024-02-12 LAB — BRAIN NATRIURETIC PEPTIDE: B Natriuretic Peptide: 387.9 pg/mL — ABNORMAL HIGH (ref 0.0–100.0)

## 2024-02-12 LAB — BLOOD GAS, VENOUS
Acid-base deficit: 3 mmol/L — ABNORMAL HIGH (ref 0.0–2.0)
Bicarbonate: 22.7 mmol/L (ref 20.0–28.0)
O2 Saturation: 76.6 %
Patient temperature: 37
pCO2, Ven: 42 mmHg — ABNORMAL LOW (ref 44–60)
pH, Ven: 7.34 (ref 7.25–7.43)
pO2, Ven: 49 mmHg — ABNORMAL HIGH (ref 32–45)

## 2024-02-12 LAB — CG4 I-STAT (LACTIC ACID): Lactic Acid, Venous: 2.4 mmol/L (ref 0.5–1.9)

## 2024-02-12 LAB — LACTIC ACID, PLASMA
Lactic Acid, Venous: 2.6 mmol/L (ref 0.5–1.9)
Lactic Acid, Venous: 5.5 mmol/L (ref 0.5–1.9)

## 2024-02-12 LAB — PHOSPHORUS: Phosphorus: 8.8 mg/dL — ABNORMAL HIGH (ref 2.5–4.6)

## 2024-02-12 MED ORDER — MELATONIN 5 MG PO TABS
5.0000 mg | ORAL_TABLET | Freq: Every evening | ORAL | Status: DC | PRN
  Administered 2024-02-16 – 2024-02-19 (×4): 5 mg via ORAL
  Filled 2024-02-12 (×5): qty 1

## 2024-02-12 MED ORDER — LACTATED RINGERS IV SOLN: INTRAVENOUS | Status: DC

## 2024-02-12 MED ORDER — VANCOMYCIN 50 MG/ML ORAL SOLUTION
125.0000 mg | Freq: Four times a day (QID) | ORAL | Status: DC
  Administered 2024-02-12: 125 mg
  Filled 2024-02-12 (×2): qty 2.5

## 2024-02-12 MED ORDER — VANCOMYCIN HCL 1.25 G IV SOLR
1250.0000 mg | INTRAVENOUS | Status: DC
  Administered 2024-02-13 – 2024-02-15 (×3): 1250 mg via INTRAVENOUS
  Filled 2024-02-12 (×4): qty 25

## 2024-02-12 MED ORDER — MIDODRINE HCL 5 MG PO TABS
10.0000 mg | ORAL_TABLET | ORAL | Status: AC
  Administered 2024-02-12: 10 mg via ORAL
  Filled 2024-02-12: qty 2

## 2024-02-12 MED ORDER — VASOPRESSIN 20 UNITS/100 ML INFUSION FOR SHOCK
0.0000 [IU]/min | INTRAVENOUS | Status: DC
  Administered 2024-02-12 – 2024-02-16 (×10): 0.04 [IU]/min via INTRAVENOUS
  Filled 2024-02-12 (×11): qty 100

## 2024-02-12 MED ORDER — PRISMASOL BGK 4/2.5 32-4-2.5 MEQ/L EC SOLN
Status: DC
Start: 2024-02-12 — End: 2024-02-13

## 2024-02-12 MED ORDER — PIPERACILLIN-TAZOBACTAM 3.375 G IVPB 30 MIN
3.3750 g | Freq: Four times a day (QID) | INTRAVENOUS | Status: DC
  Administered 2024-02-12 – 2024-02-13 (×3): 3.375 g via INTRAVENOUS
  Filled 2024-02-12 (×3): qty 50

## 2024-02-12 MED ORDER — FENTANYL CITRATE PF 50 MCG/ML IJ SOSY
25.0000 ug | PREFILLED_SYRINGE | Freq: Once | INTRAMUSCULAR | Status: AC
  Administered 2024-02-12: 25 ug via INTRAVENOUS

## 2024-02-12 MED ORDER — ARFORMOTEROL TARTRATE 15 MCG/2ML IN NEBU
15.0000 ug | INHALATION_SOLUTION | Freq: Two times a day (BID) | RESPIRATORY_TRACT | Status: DC
  Administered 2024-02-12 – 2024-02-21 (×17): 15 ug via RESPIRATORY_TRACT
  Filled 2024-02-12 (×18): qty 2

## 2024-02-12 MED ORDER — LACTATED RINGERS IV BOLUS
500.0000 mL | Freq: Once | INTRAVENOUS | Status: AC
  Administered 2024-02-12: 500 mL via INTRAVENOUS

## 2024-02-12 MED ORDER — NOREPINEPHRINE 4 MG/250ML-% IV SOLN
0.0000 ug/min | INTRAVENOUS | Status: DC
  Administered 2024-02-12: 10 ug/min via INTRAVENOUS
  Administered 2024-02-12: 2 ug/min via INTRAVENOUS

## 2024-02-12 MED ORDER — HEPARIN SODIUM (PORCINE) 1000 UNIT/ML DIALYSIS
1000.0000 [IU] | INTRAMUSCULAR | Status: DC | PRN
  Administered 2024-02-16 (×2): 1400 [IU] via INTRAVENOUS_CENTRAL
  Filled 2024-02-12: qty 6
  Filled 2024-02-12: qty 3

## 2024-02-12 MED ORDER — ACETAMINOPHEN 325 MG PO TABS
650.0000 mg | ORAL_TABLET | Freq: Four times a day (QID) | ORAL | Status: DC | PRN
  Administered 2024-02-12 – 2024-02-13 (×2): 650 mg via ORAL
  Filled 2024-02-12 (×3): qty 2

## 2024-02-12 MED ORDER — VANCOMYCIN HCL 1.25 G IV SOLR: 1250.0000 mg | Freq: Once | INTRAVENOUS | Status: DC

## 2024-02-12 MED ORDER — REVEFENACIN 175 MCG/3ML IN SOLN
175.0000 ug | Freq: Every day | RESPIRATORY_TRACT | Status: DC
  Administered 2024-02-13 – 2024-02-21 (×8): 175 ug via RESPIRATORY_TRACT
  Filled 2024-02-12 (×9): qty 3

## 2024-02-12 MED ORDER — VANCOMYCIN 50 MG/ML ORAL SOLUTION
125.0000 mg | Freq: Four times a day (QID) | ORAL | Status: AC
  Administered 2024-02-12 – 2024-02-13 (×4): 125 mg via ORAL
  Filled 2024-02-12 (×6): qty 2.5

## 2024-02-12 MED ORDER — ALBUMIN HUMAN 25 % IV SOLN
25.0000 g | Freq: Once | INTRAVENOUS | Status: AC
  Administered 2024-02-12: 25 g via INTRAVENOUS
  Filled 2024-02-12: qty 100

## 2024-02-12 MED ORDER — ORAL CARE MOUTH RINSE: 15.0000 mL | OROMUCOSAL | Status: DC | PRN

## 2024-02-12 MED ORDER — PROCHLORPERAZINE EDISYLATE 10 MG/2ML IJ SOLN: 5.0000 mg | Freq: Four times a day (QID) | INTRAMUSCULAR | Status: DC | PRN

## 2024-02-12 MED ORDER — NOREPINEPHRINE 16 MG/250ML-% IV SOLN
0.0000 ug/min | INTRAVENOUS | Status: DC
  Administered 2024-02-12: 2 ug/min via INTRAVENOUS
  Administered 2024-02-13: 25 ug/min via INTRAVENOUS
  Administered 2024-02-13: 35 ug/min via INTRAVENOUS
  Administered 2024-02-14: 15 ug/min via INTRAVENOUS
  Administered 2024-02-15: 2 ug/min via INTRAVENOUS
  Filled 2024-02-12 (×5): qty 250

## 2024-02-12 MED ORDER — NOREPINEPHRINE 4 MG/250ML-% IV SOLN: 0.0000 ug/min | INTRAVENOUS | Status: DC

## 2024-02-12 MED ORDER — IPRATROPIUM-ALBUTEROL 0.5-2.5 (3) MG/3ML IN SOLN: 3.0000 mL | RESPIRATORY_TRACT | Status: DC | PRN

## 2024-02-12 MED ORDER — HEPARIN (PORCINE) 25000 UT/250ML-% IV SOLN
1600.0000 [IU]/h | INTRAVENOUS | Status: DC
  Administered 2024-02-12 – 2024-02-13 (×2): 1400 [IU]/h via INTRAVENOUS
  Filled 2024-02-12: qty 250

## 2024-02-12 MED ORDER — ATROPINE SULFATE 1 MG/10ML IJ SOSY
PREFILLED_SYRINGE | INTRAMUSCULAR | Status: AC
  Filled 2024-02-12: qty 10

## 2024-02-12 MED ORDER — SODIUM CHLORIDE 0.9 % IV SOLN: 500.0000 [IU]/h | INTRAVENOUS | Status: DC

## 2024-02-12 MED ORDER — MIDODRINE HCL 5 MG PO TABS
10.0000 mg | ORAL_TABLET | Freq: Three times a day (TID) | ORAL | Status: DC
  Filled 2024-02-12: qty 2

## 2024-02-12 MED ORDER — VANCOMYCIN HCL 2000 MG/400ML IV SOLN
2000.0000 mg | Freq: Once | INTRAVENOUS | Status: AC
  Administered 2024-02-12: 2000 mg via INTRAVENOUS
  Filled 2024-02-12: qty 400

## 2024-02-12 MED ORDER — SODIUM CHLORIDE 0.9 % IV SOLN
4.0000 g | Freq: Once | INTRAVENOUS | Status: AC
  Administered 2024-02-12: 4 g via INTRAVENOUS
  Filled 2024-02-12: qty 40

## 2024-02-12 MED ORDER — DOPAMINE-DEXTROSE 3.2-5 MG/ML-% IV SOLN: 5.0000 ug/kg/min | INTRAVENOUS | Status: DC

## 2024-02-12 MED ORDER — FENTANYL CITRATE PF 50 MCG/ML IJ SOSY
PREFILLED_SYRINGE | INTRAMUSCULAR | Status: AC
Start: 2024-02-12 — End: 2024-02-13
  Filled 2024-02-12: qty 1

## 2024-02-12 MED ORDER — POLYETHYLENE GLYCOL 3350 17 G PO PACK: 17.0000 g | PACK | Freq: Every day | ORAL | Status: DC | PRN

## 2024-02-12 MED ORDER — ATROPINE SULFATE 1 MG/10ML IJ SOSY: 1.0000 mg | PREFILLED_SYRINGE | INTRAMUSCULAR | Status: DC | PRN

## 2024-02-12 MED ORDER — NOREPINEPHRINE 16 MG/250ML-% IV SOLN: 0.0000 ug/min | INTRAVENOUS | Status: DC

## 2024-02-12 NOTE — Assessment & Plan Note (Signed)
 Patient has recent history of C. difficile colitis and completed p.o. vancomycin  on 02/10/2024.  No ongoing symptoms

## 2024-02-12 NOTE — Assessment & Plan Note (Signed)
-   Continue home ropinirole

## 2024-02-12 NOTE — Assessment & Plan Note (Signed)
 Patient just completed 1 hour before becoming hypotensive during his prior session yesterday. -Nephrology is on board

## 2024-02-12 NOTE — Assessment & Plan Note (Signed)
 Persistent intermittent hypotension, intermittent bradycardia with sinus pauses resulted in rapid response called. Transient improvement with IV fluid bolus. Patient received multiple doses of midodrine . PCCM was consulted as patient likely need pressors.

## 2024-02-12 NOTE — Progress Notes (Signed)
 Pt admitted to rm 12 from ed. Pt appeared lethargic. Oxygen  dropped. Slow heart rate. Rapid response called and present in the bed.   Oral [place], RN

## 2024-02-12 NOTE — Consult Note (Addendum)
 NAME:  [name], MRN:  [id number], DOB:  02/24/1956, LOS: 1 ADMISSION DATE:  02/11/2024, CONSULTATION DATE:  02/12/2024 REFERRING MD:  Arbie Knock CHIEF COMPLAINT:  hypotension   History of Present Illness:  68 year old male with PMH of COPD, insulin -dependent T2DM, GERD, CAD, PAD, HLD, HTN, OSA, ESRD on HD TTS, recent admission for Cdiff colitis who presented to the emergency department on 6/3 weakness and hypotension, decreased PO intake. He was reportedly at dialysis when he became hypotensive after 1L removed to 80s systolic. In ED, Na 133, Cl 93, BUN 41/sCr 6.26, AG 16, WBC 13.0, hgb 10.5, trop 44, lactic 2.4. In ED he received 1L IVF, midodrine  with improvement in his BP. He was admitted to hospitalist. Hospitalist gave albumin  25g x2.   Pertinent  Medical History  COPD, insulin -dependent T2DM, GERD, CAD, PAD, HLD, HTN, OSA, ESRD on HD TTS, recent admission for Cdiff colitis   Significant Hospital Events: Including procedures, antibiotic start and stop dates in addition to other pertinent events   6/3 admitted after presenting for hypotension 6/4 rapid response called for bradycardic episodes and hypotension. PCCM consulted.   Interim History / Subjective:   Patient does complain of chest pain  Updated patient's wife via phone. Patient reports he would want everything done possible.   Objective    Blood pressure 122/82, pulse (!) 55, temperature (!) 97.4 F (36.3 C), temperature source Oral, resp. rate 12, height 6\' 4"  (1.93 m), weight 113.4 kg, SpO2 97%.        Intake/Output Summary (Last 24 hours) at 02/12/2024 1342 Last data filed at 02/11/2024 2345 Gross per 24 hour  Intake 600 ml  Output --  Net 600 ml   Filed Weights   02/11/24 1517  Weight: 113.4 kg    Examination: General: elderly male, chronically ill appearing HENT: [place]/AT, moist mucous membranes Lungs: diminished breath sounds Cardiovascular: distant heart sounds  Abdomen: soft, non-tender,  non-distended Extremities: bilateral BKA, warm Neuro: alert, responsive GU: n/a  Resolved Hospital Problem list     Assessment & Plan:   Shock/Lactic Acidosis Concern for cardiogenic shock vs septic shock - MAP goal 65 or greater - start levophed - check echo - placing central line and arterial line - blood cultures show no growth to date - starting vancomycin  and zosyn  for broad coverage  Bradycardia Coronary Artery Disease s/p stent to ostial/proximal RCA and mid RCA, PCI proximal RCA 11/2023 Paroxysmal Atrial Fibrillation - continue telemetry monitoring - cardiology consulted, consider heart catheterization and temporary pacing - starting levophed infusion and dopamine - check troponins and serial EKGs - echo ordered - stop eliquis  and start heparin  drip per pharmacy - continue Lipitor/plavix   C. Diff Colitis - continue PO vancomycin  (to be completed 6/3 or 6/4 from prior admission)  COPD  - brovana and yupelri nebs   ESRD  Hyperkalemia - nephrology following  - starting CRRT  DMII - SSI  Concern for dysphagia - will need cortrak  PAD - statin/plavix  as above   Best Practice (right click and "Reselect all SmartList Selections" daily)   Diet/type: NPO DVT prophylaxis: systemic heparin  Pressure ulcer(s). Clinically undetermined if the pressure ulcer was present on admission, see nursing notes GI prophylaxis: H2B Lines: Central line and Arterial Line Foley:  N/A Code Status:  full code Last date of multidisciplinary goals of care discussion [Discussed with patient, he wishes to be full code]  Labs   CBC: Recent Labs  Lab 02/11/24 1445 02/12/24 0522  WBC 13.0* 16.0*  NEUTROABS 10.6*  --   HGB 10.5* 10.5*  HCT 34.2* 33.6*  MCV 94.0 94.1  PLT 278 307    Basic Metabolic Panel: Recent Labs  Lab 02/11/24 1445 02/12/24 0522  NA 133* 133*  K 5.0 5.8*  CL 93* 90*  CO2 24 23  GLUCOSE 240* 183*  BUN 41* 52*  CREATININE 6.26* 7.44*  CALCIUM   8.0* 8.5*  MG  --  2.2  PHOS  --  8.8*   GFR: Estimated Creatinine Clearance: 12.9 mL/min (A) (by C-G formula based on SCr of 7.44 mg/dL (H)). Recent Labs  Lab 02/11/24 1445 02/11/24 1452 02/11/24 1745 02/12/24 0123 02/12/24 0522  WBC 13.0*  --   --   --  16.0*  LATICACIDVEN  --  2.4* 2.5* 2.6*  --     Liver Function Tests: Recent Labs  Lab 02/11/24 1445  AST 22  ALT 17  ALKPHOS 82  BILITOT 0.8  PROT 5.6*  ALBUMIN  2.6*   No results for input(s): "LIPASE", "AMYLASE" in the last 168 hours. No results for input(s): "AMMONIA" in the last 168 hours.  ABG    Component Value Date/Time   HCO3 20.1 07/18/2023 1118   TCO2 26 11/23/2023 1153   ACIDBASEDEF 4.0 (H) 07/18/2023 1118   O2SAT 87 07/18/2023 1118     Coagulation Profile: Recent Labs  Lab 02/11/24 1445  INR 2.6*    Cardiac Enzymes: No results for input(s): "CKTOTAL", "CKMB", "CKMBINDEX", "TROPONINI" in the last 168 hours.  HbA1C: Hgb A1c MFr Bld  Date/Time Value Ref Range Status  02/01/2024 11:19 AM 5.4 4.8 - 5.6 % Final    Comment:    (NOTE) Pre diabetes:          5.7%-6.4%  Diabetes:              >6.4%  Glycemic control for   <7.0% adults with diabetes   07/19/2023 05:37 AM 5.7 (H) 4.8 - 5.6 % Final    Comment:    (NOTE) Pre diabetes:          5.7%-6.4%  Diabetes:              >6.4%  Glycemic control for   <7.0% adults with diabetes     CBG: Recent Labs  Lab 02/11/24 2232 02/12/24 0839 02/12/24 1225 02/12/24 1319  GLUCAP 228* 173* 150* 151*    Review of Systems:   As above  Past Medical History:  He,  has a past medical history of Carotid artery occlusion (11/10/2010), Complication of anesthesia, COPD (chronic obstructive pulmonary disease) (HCC), Diabetes mellitus without complication (HCC), Diverticulitis, Diverticulosis of colon (without mention of hemorrhage), DJD (degenerative joint disease), ESRD (end stage renal disease) on dialysis (HCC), Fatty liver, Full dentures, GERD  (gastroesophageal reflux disease), H/O hiatal hernia, History of blood transfusion, Hyperlipidemia, Hypertension, Neuromuscular disorder (HCC), Non-pressure chronic ulcer of other part of left foot limited to breakdown of skin (HCC) (11/12/2016), Osteomyelitis (HCC), PAD (peripheral artery disease) (HCC), Pseudoclaudication (11/15/2018), Sleep apnea, Slurred speech, 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   Surgical History:   Past Surgical History:  Procedure Laterality Date   ABDOMINAL AORTOGRAM W/LOWER EXTREMITY N/A 06/23/2021   Procedure: ABDOMINAL AORTOGRAM W/LOWER EXTREMITY;  Surgeon: Cody Das, MD;  [facility];  Service: Cardiovascular;  Laterality: N/A;   AMPUTATION  11/05/2011   Procedure: AMPUTATION RAY;  Surgeon: Amada Backer, MD;  Location: [facility] OR;  Service: Orthopedics;  Laterality: Right;  Amputation of  Right 4&5th Toes   AMPUTATION Left 11/26/2012   Procedure: AMPUTATION RAY;  Surgeon: Amada Backer, MD;  Location: [facility] OR;  Service: Orthopedics;  Laterality: Left;  fourth ray amputation   AMPUTATION Right 08/27/2014   Procedure: Transmetatarsal Amputation;  Surgeon: Timothy Ford, MD;  [facility] OR;  Service: Orthopedics;  Laterality: Right;   AMPUTATION Right 01/14/2015   Procedure: AMPUTATION BELOW KNEE;  Surgeon: Timothy Ford, MD;  Location: [facility] OR;  Service: Orthopedics;  Laterality: Right;   AMPUTATION Left 10/21/2015   Procedure: Left Foot 5th Ray Amputation;  Surgeon: Timothy Ford, MD;  Location: [facility] OR;  Service: Orthopedics;  Laterality: Left;   AMPUTATION Left 07/04/2022   Procedure: LEFT BELOW KNEE AMPUTATION;  Surgeon: Timothy Ford, MD;  Location: [facility] OR;  Service: Orthopedics;  Laterality: Left;   ANTERIOR FUSION CERVICAL SPINE  02/06/06   C4-5, C5-6, C6-7; SURGEON DR. MAX COHEN   AV FISTULA PLACEMENT Left 06/02/2020   Procedure: ARTERIOVENOUS (AV) FISTULA CREATION LEFT;  Surgeon: Adine Hoof, MD;  Location: [facility] OR;  Service: Vascular;  Laterality: Left;   BACK SURGERY     x 3   BASCILIC VEIN TRANSPOSITION Left 07/21/2020   Procedure: LEFT UPPER ARM ATERIOVENOUS SUPERFISTULALIZATION;  Surgeon: Adine Hoof, MD;  Location: [facility] OR;  Service: Vascular;  Laterality: Left;   BELOW KNEE LEG AMPUTATION Right    CARDIAC CATHETERIZATION  10/31/04   2009   CAROTID ENDARTERECTOMY  11/10/10   CAROTID ENDARTERECTOMY Left 11/10/2010   Subtotal occlusion of left internal carotid artery with left hemispheric transient ischemic attacks.   CAROTID STENT     CARPAL TUNNEL RELEASE Right 10/21/2013   Procedure: RIGHT CARPAL TUNNEL RELEASE;  Surgeon: Kemp Patter, MD;  Location: [facility];  Service: Orthopedics;  Laterality: Right;   CHOLECYSTECTOMY     COLON SURGERY     COLONOSCOPY     COLOSTOMY REVERSAL  05/21/2018   ileostomy reversal   CORONARY LITHOTRIPSY N/A 11/24/2023   Procedure: CORONARY LITHOTRIPSY;  Surgeon: Swaziland, Peter M, MD;  Location: [facility] INVASIVE CV LAB;  Service: Cardiovascular;  Laterality: N/A;   CORONARY STENT INTERVENTION N/A 11/24/2023   Procedure: CORONARY STENT INTERVENTION;  Surgeon: Swaziland, Peter M, MD;  Location: [facility] INVASIVE CV LAB;  Service: Cardiovascular;  Laterality: N/A;   CYSTOSCOPY WITH STENT PLACEMENT Bilateral 01/13/2018   Procedure: CYSTOSCOPY WITH BILATERAL URETERAL CATHETER PLACEMENT;  Surgeon: Andrez Banker, MD;  Location: [facility] ORS;  Service: Urology;  Laterality: Bilateral;   ESOPHAGEAL MANOMETRY Bilateral 07/19/2014   Procedure: ESOPHAGEAL MANOMETRY (EM);  Surgeon: Nannette Babe, MD;  Location: [facility];  Service: Gastroenterology;  Laterality: Bilateral;   EYE SURGERY Bilateral 2020   cataract   FEMORAL ARTERY STENT     x6   FINGER SURGERY     FOOT SURGERY  04/25/2016    EXCISION BASE 5TH METATARSAL AND PARTIAL CUBOID LEFT FOOT   HERNIA REPAIR     LEFT INGUINAL AND UMBILICAL REPAIRS   HERNIA REPAIR     I & D  EXTREMITY Left 04/25/2016   Procedure: EXCISION BASE 5TH METATARSAL AND PARTIAL CUBOID LEFT FOOT;  Surgeon: Timothy Ford, MD;  Location: [facility] OR;  Service: Orthopedics;  Laterality: Left;   ILEOSTOMY  01/13/2018   Procedure: ILEOSTOMY;  Surgeon: Adalberto Acton, MD;  Location: [facility] ORS;  Service: General;;   ILEOSTOMY CLOSURE N/A 05/21/2018   Procedure: ILEOSTOMY REVERSAL ERAS PATHWAY;  Surgeon: Adalberto Acton,  MD;  Location: [facility] OR;  Service: General;  Laterality: N/A;   IR RADIOLOGIST EVAL & MGMT  11/19/2017   IR RADIOLOGIST EVAL & MGMT  12/03/2017   IR RADIOLOGIST EVAL & MGMT  12/18/2017   JOINT REPLACEMENT Right 2001   Total knee   LAMINECTOMY     X 3 LUMBAR AND X 2 CERVICAL SPINE OPERATIONS   LAPAROSCOPIC CHOLECYSTECTOMY W/ CHOLANGIOGRAPHY  11/09/04   SURGEON DR. Annetta Barter   LEFT HEART CATH AND CORONARY ANGIOGRAPHY N/A 09/16/2018   Procedure: LEFT HEART CATH AND CORONARY ANGIOGRAPHY;  Surgeon: Cody Das, MD;  [facility];  Service: Cardiovascular;  Laterality: N/A;   LEFT HEART CATH AND CORONARY ANGIOGRAPHY N/A 11/24/2023   Procedure: LEFT HEART CATH AND CORONARY ANGIOGRAPHY;  Surgeon: Swaziland, Peter M, MD;  Location: [facility] INVASIVE CV LAB;  Service: Cardiovascular;  Laterality: N/A;   LEFT HEART CATHETERIZATION WITH CORONARY ANGIOGRAM N/A 10/29/2014   Procedure: LEFT HEART CATHETERIZATION WITH CORONARY ANGIOGRAM;  Surgeon: Jessica Morn, MD;  Location: [facility] CATH LAB;  Service: Cardiovascular;  Laterality: N/A;   LIGATION OF COMPETING BRANCHES OF ARTERIOVENOUS FISTULA Left 07/21/2020   Procedure: LIGATION OF COMPETING BRANCHES OF LEFT UPPER ARM ARTERIOVENOUS FISTULA;  Surgeon: Adine Hoof, MD;  Location: [facility] OR;  Service: Vascular;  Laterality: Left;   LOWER EXTREMITY ANGIOGRAM N/A 03/19/2012   Procedure: LOWER EXTREMITY ANGIOGRAM;  Surgeon: Odie Benne, MD;  Location: [facility] CATH LAB;  Service: Cardiovascular;  Laterality: N/A;   LOWER EXTREMITY  ANGIOGRAPHY N/A 06/20/2021   Procedure: LOWER EXTREMITY ANGIOGRAPHY;  Surgeon: Cody Das, MD;  [facility];  Service: Cardiovascular;  Laterality: N/A;   NECK SURGERY     PARTIAL COLECTOMY N/A 01/13/2018   Procedure: LAPAROSCOPIC ASSISTED   SIGMOID COLECTOMY ILEOSTOMY;  Surgeon: Adalberto Acton, MD;  Location: [facility]S;  Service: General;  Laterality: N/A;   PENILE PROSTHESIS IMPLANT  08/14/05   INFRAPUBIC INSERTION OF INFLATABLE PENILE PROSTHESIS; SURGEON DR. Luster Salters   PENILE PROSTHESIS IMPLANT     PERCUTANEOUS CORONARY STENT INTERVENTION (PCI-S) Right 10/29/2014   Procedure: PERCUTANEOUS CORONARY STENT INTERVENTION (PCI-S);  Surgeon: Jessica Morn, MD;  Location: [facility] CATH LAB;  Service: Cardiovascular;  Laterality: Right;   PERIPHERAL VASCULAR INTERVENTION Left 06/23/2021   Procedure: PERIPHERAL VASCULAR INTERVENTION;  Surgeon: Cody Das, MD;  [facility]: [facility];  Service: Cardiovascular;  Laterality: Left;   REMOVAL OF PENILE PROSTHESIS N/A 06/14/2021   Procedure: Removal of THREE piece inflatable penile prosthesis;  Surgeon: Samson Croak, MD;  Location: [facility] OR;  Service: Urology;  Laterality: N/A;   SHOULDER ARTHROSCOPY     SPINE SURGERY     TOE AMPUTATION Left    TONSILLECTOMY     TOTAL KNEE ARTHROPLASTY  07/2002   RIGHT KNEE ; SURGEON  DR. [name] ALSO HAD ARTHROSCOPIC RIGHT KNEE IN  10/2001   TOTAL KNEE ARTHROPLASTY     ULNAR NERVE TRANSPOSITION Right 10/21/2013   Procedure: RIGHT ELBOW  ULNAR NERVE DECOMPRESSION;  Surgeon: Kemp Patter, MD;  Location: [facility];  Service: Orthopedics;  Laterality: Right;     Social History:   reports that he has been smoking cigarettes. He has a 17.5 pack-year smoking history. He has never used smokeless tobacco. He reports that he does not currently use alcohol . He reports that he does not use drugs.   Family History:  His family history includes Arthritis in an other  family member;  Cancer in his father and sister; Colon cancer in his brother; Diabetes in his brother and father; Heart attack in his brother and father; Heart disease in his father; Heart disease (age of onset: 41) in his brother; Hyperlipidemia in his brother, father, and sister; Hypertension in his brother, father, sister, son, and another family member; Varicose Veins in his father.   Allergies Allergies  Allergen Reactions   Contrast Media [Iodinated Contrast Media] Shortness Of Breath and Other (See Comments)    Difficulty breathing and altered mental status     Ivp Dye [Iodinated Contrast Media] Anaphylaxis, Shortness Of Breath and Other (See Comments)    Breathing problems, altered mental state    Latex Rash and Other (See Comments)    A severe rash appears after the first 24 hours of being placed   Tape Other (See Comments) and Rash    Rash after 1 day of use  Do not leave on longer then 3 days with out be changed     Home Medications  Prior to Admission medications   Medication Sig Start Date End Date Taking? Authorizing Provider  amiodarone  (PACERONE ) 200 MG tablet Take 0.5 tablets (100 mg total) by mouth daily. Patient taking differently: Take 200 mg by mouth in the morning. 01/01/24  Yes Knox Perl, MD  amLODipine  (NORVASC ) 10 MG tablet Take 10 mg by mouth in the morning.   Yes [provider]  apixaban  (ELIQUIS ) 5 MG TABS tablet Take 1 tablet (5 mg total) by mouth 2 (two) times daily. 01/14/22 03/09/24 Yes Gonfa, Taye T, MD  atorvastatin  (LIPITOR) 80 MG tablet Take 1 tablet (80 mg total) by mouth at bedtime. 11/25/23  Yes Amoako, Prince, MD  B Complex-C-Zn-Folic Acid  (DIALYVITE /ZINC ) TABS Take 1 tablet by mouth Every Tuesday,Thursday,and Saturday with dialysis. 10/04/21  Yes [provider]  carvedilol  (COREG ) 3.125 MG tablet Take 1 tablet (3.125 mg total) by mouth 2 (two) times daily with a meal. 01/01/24  Yes Knox Perl, MD  clopidogrel  (PLAVIX ) 75 MG tablet Take 1 tablet (75  mg total) by mouth daily with breakfast. 11/25/23 11/24/24 Yes Amoako, Prince, MD  famotidine  (PEPCID ) 20 MG tablet Take 20 mg by mouth every other day.   Yes [provider]  fentaNYL  (DURAGESIC ) 50 MCG/HR Place 1 patch onto the skin every 3 (three) days. 07/25/23  Yes [provider]  folic acid  (FOLVITE ) 1 MG tablet Take 1 mg by mouth in the morning. 09/18/21  Yes [provider]  gabapentin  (NEURONTIN ) 100 MG capsule Take 100 mg by mouth 3 (three) times daily.   Yes [provider]  hydrALAZINE  (APRESOLINE ) 50 MG tablet Take 50 mg by mouth every 8 (eight) hours.   Yes [provider]  insulin  aspart (NOVOLOG  FLEXPEN) 100 UNIT/ML FlexPen Inject 0-12 Units into the skin See admin instructions. Inject 4 units into the skin with meals in addition to sliding scale as needed. Inject as per sliding scale: If 0 - 69 = 0 initiate hypoglycemia orders;  70 - 150 = 0;  151 - 200 = 2;  201 - 250 = 4;  251 - 300 = 6;  301 - 350 = 8;  351 - 400 = 10;  401 - 450 = 12.  If >400, give 12 units, recheck in 1 hour. If still >400, call MD/NP for further orders. Subcutaneously with meals in addition to scheduled 4 untes with meals.   Yes [provider]  isosorbide  mononitrate (IMDUR ) 60  MG 24 hr tablet Take 1 tablet (60 mg total) by mouth daily. 01/21/23 02/11/24 Yes Knox Perl, MD  lidocaine -prilocaine  (EMLA ) cream Apply 1 application topically See admin instructions. Prior to Dialysis days Tuesday,Thursday and saturday 06/06/21  Yes [provider]  loratadine  (CLARITIN ) 10 MG tablet Take 10 mg by mouth in the morning.   Yes [provider]  magnesium  oxide (MAG-OX) 400 (240 Mg) MG tablet Take 1 tablet (400 mg total) by mouth daily. 02/04/24  Yes Amin, Sumayya, MD  mineral oil-hydrophilic petrolatum (AQUAPHOR) ointment Apply 1 Application topically as needed for dry skin or irritation (Upper thigh).   Yes [provider]  nitroGLYCERIN   (NITROSTAT ) 0.4 MG SL tablet Place 0.4 mg under the tongue every 5 (five) minutes as needed for chest pain. 01/06/22  Yes [provider]  oxyCODONE -acetaminophen  (PERCOCET /ROXICET) 5-325 MG tablet Take 1 tablet by mouth every 8 (eight) hours as needed for severe pain. 05/31/23  Yes Ghimire, Estil Heman, MD  promethazine  (PHENERGAN ) 12.5 MG suppository Place 12.5 mg rectally daily as needed for nausea or vomiting.   Yes [provider]  rOPINIRole  (REQUIP ) 2 MG tablet Take 2 mg by mouth at bedtime.   Yes [provider]  sevelamer  carbonate (RENVELA ) 800 MG tablet Take 2 tablets (1,600 mg total) by mouth 3 (three) times daily with meals. 02/04/24  Yes Luna Salinas, MD  tamsulosin  (FLOMAX ) 0.4 MG CAPS capsule Take 0.4 mg by mouth in the morning.   Yes [provider]     Critical care time: 40 min    Duaine German, MD [facility] Office: [phone]   See Amion for personal pager PCCM on call pager [phone] until 7pm. Please call Elink 7p-7a. [phone]

## 2024-02-12 NOTE — Assessment & Plan Note (Signed)
 Continue home H2 blocker.

## 2024-02-12 NOTE — Assessment & Plan Note (Signed)
>>  ASSESSMENT AND PLAN FOR PAD (PERIPHERAL ARTERY DISEASE) (HCC) WRITTEN ON 02/12/2024  2:54 PM BY AMIN, SUMAYYA, MD  S/p bilateral BKA - Continue Plavix  and statin

## 2024-02-12 NOTE — Assessment & Plan Note (Signed)
-  Continue with SSI

## 2024-02-12 NOTE — Procedures (Signed)
 Central Venous Catheter Insertion Procedure Note  [name]  [id number]  Sep 01, 1956  Date:02/12/24  Time:3:27 PM   Provider Performing:Hamed Debella [name]  [name]   Procedure: Insertion of Non-tunneled Central Venous Catheter(36556)with US  guidance (57846)    Indication(s) Medication administration, Difficult access, and Hemodialysis  Consent Unable to obtain consent due to emergent nature of procedure.  Anesthesia Topical only with 1% lidocaine    Timeout Verified patient identification, verified procedure, site/side was marked, verified correct patient position, special equipment/implants available, medications/allergies/relevant history reviewed, required imaging and test results available.  Sterile Technique Maximal sterile technique including full sterile barrier drape, hand hygiene, sterile gown, sterile gloves, mask, hair covering, sterile ultrasound probe cover (if used).  Procedure Description Area of catheter insertion was cleaned with chlorhexidine  and draped in sterile fashion.   With real-time ultrasound guidance a HD catheter was placed into the left internal jugular vein.  Nonpulsatile blood flow and easy flushing noted in all ports.  The catheter was sutured in place and sterile dressing applied.  Complications/Tolerance None; patient tolerated the procedure well. Chest X-ray is ordered to verify placement for internal jugular or subclavian cannulation.  Chest x-ray is not ordered for femoral cannulation.  EBL Minimal  Specimen(s) None   [name], PA-C [facility] 02/12/24 3:27 PM  Please see [email or web address] for pager details.  From 7A-7P if no response, please call [phone] After hours, please call ELink [phone]

## 2024-02-12 NOTE — Progress Notes (Signed)
 PHARMACY - ANTICOAGULATION CONSULT NOTE  Pharmacy Consult for heparin  Indication: atrial fibrillation  Allergies  Allergen Reactions   Contrast Media [Iodinated Contrast Media] Shortness Of Breath and Other (See Comments)    Difficulty breathing and altered mental status     Ivp Dye [Iodinated Contrast Media] Anaphylaxis, Shortness Of Breath and Other (See Comments)    Breathing problems, altered mental state    Latex Rash and Other (See Comments)    A severe rash appears after the first 24 hours of being placed   Tape Other (See Comments) and Rash    Rash after 1 day of use  Do not leave on longer then 3 days with out be changed    Patient Measurements: Height: 6\' 4"  (193 cm) Weight: 113.4 kg (250 lb) IBW/kg (Calculated) : 86.8 HEPARIN  DW (KG): 110  Vital Signs: Temp: 97.4 F (36.3 C) (06/04 1217) Temp Source: Oral (06/04 1217) BP: 122/82 (06/04 1221) Pulse Rate: 55 (06/04 1245)  Labs: Recent Labs    02/11/24 1445 02/11/24 1738 02/12/24 0522 02/12/24 1343  HGB 10.5*  --  10.5*  --   HCT 34.2*  --  33.6*  --   PLT 278  --  307  --   LABPROT 27.8*  --   --   --   INR 2.6*  --   --   --   CREATININE 6.26*  --  7.44*  --   TROPONINIHS 44* 46*  --  52*    Estimated Creatinine Clearance: 12.9 mL/min (A) (by C-G formula based on SCr of 7.44 mg/dL (H)).   Medical History: Past Medical History:  Diagnosis Date   Carotid artery occlusion 11/10/2010   LEFT CAROTID ENDARTERECTOMY   Complication of anesthesia    BP WENT UP AT [facility] "   COPD (chronic obstructive pulmonary disease) (HCC)    pt denies this dx as of 06/01/20 - no inhaler    Diabetes mellitus without complication (HCC)    Diverticulitis    Diverticulosis of colon (without mention of hemorrhage)    DJD (degenerative joint disease)    knees/hands/feet/back/neck   ESRD (end stage renal disease) on dialysis (HCC)    Fatty liver    Full dentures    GERD (gastroesophageal reflux disease)    H/O hiatal  hernia    History of blood transfusion    with a past surical procedure per patient 06/01/20   Hyperlipidemia    Hypertension    Neuromuscular disorder (HCC)    peripheral neuropathy   Non-pressure chronic ulcer of other part of left foot limited to breakdown of skin (HCC) 11/12/2016   Osteomyelitis (HCC)    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 Atherectomy left popliteal artery July 2012.    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 Pt is on apixaban  PTA for AF, pharmacy to transition to IV heparin , last dose of apixaban  this am.  Goal of Therapy:  Heparin  level 0.3-0.7 units/ml aPTT 66-102 seconds Monitor platelets by anticoagulation protocol: Yes   Plan:  Heparin  1400 units/h no bolus at 2200 Check aPTT and heparin  level at 0600  Levin Reamer, PharmD,  BCPS, [facility] Clinical Pharmacist [phone] Please check AMION for all [facility] Pharmacy numbers 02/12/2024

## 2024-02-12 NOTE — Assessment & Plan Note (Signed)
 Worsening leukocytosis.  No obvious infection. -CT abdomen and chest was ordered to rule out any underlying source.  Preliminary blood cultures negative.

## 2024-02-12 NOTE — Assessment & Plan Note (Signed)
 Hemoglobin seems stable around baseline -Continue to monitor

## 2024-02-12 NOTE — Progress Notes (Signed)
 Arrived to complete ordered echocardiogram at 2:35pm- PT off the floor not in room.

## 2024-02-12 NOTE — Hospital Course (Addendum)
 Taken from H&P.  [name] is a 68 y.o. male with medical history significant for ESRD TTS, hypertension, hyperlipidemia, type 2 diabetes, COPD, peripheral artery disease status post bilateral below the knee amputation, paroxysmal A-fib on Eliquis , coronary artery disease status post PCI with drug-eluting stent placement on 11/24/2023 on Plavix  (also on Eliquis -presumably completed 1 month of aspirin  81 mg), history of C. difficile colitis in January 2025, recently admitted on 01/31/2024 through 02/04/2024 due to diarrhea secondary to C. difficile colitis and abdominal pain, who presents to the ER via EMS from hemodialysis center due to hypotension.  His session was aborted after 1 hour due to the hypotension.   On presentation systolic blood pressure in 80s, some improvement with midodrine .  Labs with leukocytosis at 13, sodium 133, glucose 240, bicarb 24.  Home antihypertensives were held and started on midodrine  10 mg 3 times daily.  6/4: Blood pressure at 89/55, labs with potassium of 5.8, renal function consistent with ESRD, worsening leukocytosis at 16, lactic acid at 2.6, blood cultures pending, chest x-ray with left small pleural with some adjacent atelectasis.  Patient was feeling very lethargic, and another bolus of 500 cc was given due to decreasing blood pressure, resulted in some improvement initially. Later when patient transferred to floor from ED, he had a 12 to 15-second sinus pause and became unresponsive, rapid response was initially called and when they were about to start recording him he became responsive again.  Still very lethargic and becoming hypotensive.  Intermittent bradycardia.  Echocardiogram and troponin was ordered.  PCCM was consulted and patient will be transferred to ICU for pressors.

## 2024-02-12 NOTE — Assessment & Plan Note (Signed)
 Continue home statin

## 2024-02-12 NOTE — Consult Note (Signed)
 Renal Service Consult Note [facility]  [name] 02/12/2024 Lynae Sandifer, MD Requesting Physician: Dr. Ariel Begun   Reason for Consult: ESRD pt w/ hypotension HPI: The patient is a 68 y.o. year-old w/ PMH as below who presented to ED sent from HD unit due to low BP's. Pt feeling weak for about 7 days. Taking po vanc for recent c diff infection. Lives at SNF. In ED BP 80/56, HR 89, RR 15-23, temp 97.8, 97% on RA.  K+ 5.8, bun 52, creat 7.4, Ca 8.5, phos 8.8, LA 2.6, WBC 16K, Hb 10.5.  Blood cx's sent. Pt rec'd IV alb 25gm, LR 500, NS 1 L bolus, LR at 75 cc/hr, midodrine  5mg  x 1, then midodrine  10mg  later in the evening and 10mg  again this am. Pt admitted to progressive floor.  We are asked to see for dialysis.    Pt seen in ED room. Pt appears a bit confused. Can answer simple questions. Is lethargic. Poor historian at this moment.    ROS - denies CP, no joint pain, no HA, no blurry vision, no rash, no diarrhea, no nausea/ vomiting  PMH: Left carotid endarterectomy COPD Diabetes type 2 Diverticulitis DJD ESRD on hemodialysis Fatty liver GERD HL HTN PAD Sleep apnea  Past Surgical History  Past Surgical History:  Procedure Laterality Date   ABDOMINAL AORTOGRAM W/LOWER EXTREMITY N/A 06/23/2021   Procedure: ABDOMINAL AORTOGRAM W/LOWER EXTREMITY;  Surgeon: Cody Das, MD;  Location: [facility] INV[facility]IVE CV LAB;  Service: Cardiovascular;  Laterality: N/A;   AMPUTATION  11/05/2011   Procedure: AMPUTATION RAY;  Surgeon: Amada Backer, MD;  Location: [facility] OR;  Service: Orthopedics;  Laterality: Right;  Amputation of Right 4&5th Toes   AMPUTATION Left 11/26/2012   Procedure: AMPUTATION RAY;  Surgeon: Amada Backer, MD;  Location: [facility] OR;  Service: Orthopedics;  Laterality: Left;  fourth ray amputation   AMPUTATION Right 08/27/2014   Procedure: Transmetatarsal Amputation;  Surgeon: Timothy Ford, MD;  Location: [facility] OR;  Service: Orthopedics;  Laterality: Right;   AMPUTATION  Right 01/14/2015   Procedure: AMPUTATION BELOW KNEE;  Surgeon: Timothy Ford, MD;  Location: [facility] OR;  Service: Orthopedics;  Laterality: Right;   AMPUTATION Left 10/21/2015   Procedure: Left Foot 5th Ray Amputation;  Surgeon: Timothy Ford, MD;  Location: [facility] OR;  Service: Orthopedics;  Laterality: Left;   AMPUTATION Left 07/04/2022   Procedure: LEFT BELOW KNEE AMPUTATION;  Surgeon: Timothy Ford, MD;  Location: [facility] OR;  Service: Orthopedics;  Laterality: Left;   ANTERIOR FUSION CERVICAL SPINE  02/06/06   C4-5, C5-6, C6-7; SURGEON DR. MAX COHEN   AV FISTULA PLACEMENT Left 06/02/2020   Procedure: ARTERIOVENOUS (AV) FISTULA CREATION LEFT;  Surgeon: Adine Hoof, MD;  Location: [facility] OR;  Service: Vascular;  Laterality: Left;   BACK SURGERY     x 3   BASCILIC VEIN TRANSPOSITION Left 07/21/2020   Procedure: LEFT UPPER ARM ATERIOVENOUS SUPERFISTULALIZATION;  Surgeon: Adine Hoof, MD;  Location: [facility] OR;  Service: Vascular;  Laterality: Left;   BELOW KNEE LEG AMPUTATION Right    CARDIAC CATHETERIZATION  10/31/04   2009   CAROTID ENDARTERECTOMY  11/10/10   CAROTID ENDARTERECTOMY Left 11/10/2010   Subtotal occlusion of left internal carotid artery with left hemispheric transient ischemic attacks.   CAROTID STENT     CARPAL TUNNEL RELEASE Right 10/21/2013   Procedure: RIGHT CARPAL TUNNEL RELEASE;  Surgeon: Kemp Patter, MD;  Location: [facility];  Service:  Orthopedics;  Laterality: Right;   CHOLECYSTECTOMY     COLON SURGERY     COLONOSCOPY     COLOSTOMY REVERSAL  05/21/2018   ileostomy reversal   CORONARY LITHOTRIPSY N/A 11/24/2023   Procedure: CORONARY LITHOTRIPSY;  Surgeon: Swaziland, Peter M, MD;  Location: [facility] INVASIVE CV LAB;  Service: Cardiovascular;  Laterality: N/A;   CORONARY STENT INTERVENTION N/A 11/24/2023   Procedure: CORONARY STENT INTERVENTION;  Surgeon: Swaziland, Peter M, MD;  Location: [facility] INVASIVE CV LAB;  Service: Cardiovascular;  Laterality: N/A;   CYSTOSCOPY  WITH STENT PLACEMENT Bilateral 01/13/2018   Procedure: CYSTOSCOPY WITH BILATERAL URETERAL CATHETER PLACEMENT;  Surgeon: Andrez Banker, MD;  Location: [facility] ORS;  Service: Urology;  Laterality: Bilateral;   ESOPHAGEAL MANOMETRY Bilateral 07/19/2014   Procedure: ESOPHAGEAL MANOMETRY (EM);  Surgeon: Nannette Babe, MD;  Location: [facility] ENDOSCOPY;  Service: Gastroenterology;  Laterality: Bilateral;   EYE SURGERY Bilateral 2020   cataract   FEMORAL ARTERY STENT     x6   FINGER SURGERY     FOOT SURGERY  04/25/2016    EXCISION BASE 5TH METATARSAL AND PARTIAL CUBOID LEFT FOOT   HERNIA REPAIR     LEFT INGUINAL AND UMBILICAL REPAIRS   HERNIA REPAIR     I & D EXTREMITY Left 04/25/2016   Procedure: EXCISION BASE 5TH METATARSAL AND PARTIAL CUBOID LEFT FOOT;  Surgeon: Timothy Ford, MD;  Location: [facility];  Service: Orthopedics;  Laterality: Left;   ILEOSTOMY  01/13/2018   Procedure: ILEOSTOMY;  Surgeon: Adalberto Acton, MD;  Location: [facility] ORS;  Service: General;;   ILEOSTOMY CLOSURE N/A 05/21/2018   Procedure: ILEOSTOMY REVERSAL ERAS PATHWAY;  Surgeon: Adalberto Acton, MD;  Location: [facility] OR;  Service: General;  Laterality: N/A;   IR RADIOLOGIST EVAL & MGMT  11/19/2017   IR RADIOLOGIST EVAL & MGMT  12/03/2017   IR RADIOLOGIST EVAL & MGMT  12/18/2017   JOINT REPLACEMENT Right 2001   Total knee   LAMINECTOMY     X 3 LUMBAR AND X 2 CERVICAL SPINE OPERATIONS   LAPAROSCOPIC CHOLECYSTECTOMY W/ CHOLANGIOGRAPHY  11/09/04   SURGEON DR. Annetta Barter   LEFT HEART CATH AND CORONARY ANGIOGRAPHY N/A 09/16/2018   Procedure: LEFT HEART CATH AND CORONARY ANGIOGRAPHY;  Surgeon: Cody Das, MD;  [facility]: [facility];  Service: Cardiovascular;  Laterality: N/A;   LEFT HEART CATH AND CORONARY ANGIOGRAPHY N/A 11/24/2023   Procedure: LEFT HEART CATH AND CORONARY ANGIOGRAPHY;  Surgeon: Swaziland, Peter M, MD;  Location: [facility] INVASIVE CV LAB;  Service: Cardiovascular;  Laterality: N/A;   LEFT HEART CATHETERIZATION WITH  CORONARY ANGIOGRAM N/A 10/29/2014   Procedure: LEFT HEART CATHETERIZATION WITH CORONARY ANGIOGRAM;  Surgeon: Jessica Morn, MD;  Location: [facility] CATH LAB;  Service: Cardiovascular;  Laterality: N/A;   LIGATION OF COMPETING BRANCHES OF ARTERIOVENOUS FISTULA Left 07/21/2020   Procedure: LIGATION OF COMPETING BRANCHES OF LEFT UPPER ARM ARTERIOVENOUS FISTULA;  Surgeon: Adine Hoof, MD;  Location: [facility] OR;  Service: Vascular;  Laterality: Left;   LOWER EXTREMITY ANGIOGRAM N/A 03/19/2012   Procedure: LOWER EXTREMITY ANGIOGRAM;  Surgeon: Odie Benne, MD;  Location: [facility] CATH LAB;  Service: Cardiovascular;  Laterality: N/A;   LOWER EXTREMITY ANGIOGRAPHY N/A 06/20/2021   Procedure: LOWER EXTREMITY ANGIOGRAPHY;  Surgeon: Cody Das, MD;  [facility]: [facility];  Service: Cardiovascular;  Laterality: N/A;   NECK SURGERY     PARTIAL COLECTOMY N/A 01/13/2018   Procedure: LAPAROSCOPIC ASSISTED  SIGMOID COLECTOMY ILEOSTOMY;  Surgeon: Adalberto Acton, MD;  Location: [facility] ORS;  Service: General;  Laterality: N/A;   PENILE PROSTHESIS IMPLANT  08/14/05   INFRAPUBIC INSERTION OF INFLATABLE PENILE PROSTHESIS; SURGEON DR. Luster Salters   PENILE PROSTHESIS IMPLANT     PERCUTANEOUS CORONARY STENT INTERVENTION (PCI-S) Right 10/29/2014   Procedure: PERCUTANEOUS CORONARY STENT INTERVENTION (PCI-S);  Surgeon: Jessica Morn, MD;  Location: [facility] CATH LAB;  Service: Cardiovascular;  Laterality: Right;   PERIPHERAL VASCULAR INTERVENTION Left 06/23/2021   Procedure: PERIPHERAL VASCULAR INTERVENTION;  Surgeon: Cody Das, MD;  [facility]: [facility];  Service: Cardiovascular;  Laterality: Left;   REMOVAL OF PENILE PROSTHESIS N/A 06/14/2021   Procedure: Removal of THREE piece inflatable penile prosthesis;  Surgeon: Samson Croak, MD;  Location: [facility] OR;  Service: Urology;  Laterality: N/A;   SHOULDER ARTHROSCOPY     SPINE SURGERY     TOE AMPUTATION Left    TONSILLECTOMY     TOTAL  KNEE ARTHROPLASTY  07/2002   RIGHT KNEE ; SURGEON  DR. [name] ALSO HAD ARTHROSCOPIC RIGHT KNEE IN  10/2001   TOTAL KNEE ARTHROPLASTY     ULNAR NERVE TRANSPOSITION Right 10/21/2013   Procedure: RIGHT ELBOW  ULNAR NERVE DECOMPRESSION;  Surgeon: Kemp Patter, MD;  Location: [facility];  Service: Orthopedics;  Laterality: Right;   Family History  Family History  Problem Relation Age of Onset   Heart disease Father        Before age 23-  CAD, BPG   Diabetes Father        Amputation   Cancer Father        PROSTATE   Hyperlipidemia Father    Hypertension Father    Heart attack Father        Triple BPG   Varicose Veins Father    Cancer Sister        Breast   Hyperlipidemia Sister    Hypertension Sister    Heart attack Brother    Colon cancer Brother    Diabetes Brother    Heart disease Brother 28       A-Fib. Before age 60   Hyperlipidemia Brother    Hypertension Brother    Hypertension Son    Arthritis Other        GRANDMOTHER   Hypertension Other        OTHER FAMILY MEMBERS   Social History  reports that he has been smoking cigarettes. He has a 17.5 pack-year smoking history. He has never used smokeless tobacco. He reports that he does not currently use alcohol . He reports that he does not use drugs. Allergies  Allergies  Allergen Reactions   Contrast Media [Iodinated Contrast Media] Shortness Of Breath and Other (See Comments)    Difficulty breathing and altered mental status     Ivp Dye [Iodinated Contrast Media] Anaphylaxis, Shortness Of Breath and Other (See Comments)    Breathing problems, altered mental state    Latex Rash and Other (See Comments)    A severe rash appears after the first 24 hours of being placed   Tape Other (See Comments) and Rash    Rash after 1 day of use  Do not leave on longer then 3 days with out be changed   Home medications Prior to Admission medications   Medication Sig Start Date End Date Taking? Authorizing Provider   amiodarone  (PACERONE ) 200 MG tablet Take 0.5 tablets (100 mg total) by mouth daily. Patient  taking differently: Take 200 mg by mouth in the morning. 01/01/24  Yes Knox Perl, MD  amLODipine  (NORVASC ) 10 MG tablet Take 10 mg by mouth in the morning.   Yes [provider]  apixaban  (ELIQUIS ) 5 MG TABS tablet Take 1 tablet (5 mg total) by mouth 2 (two) times daily. 01/14/22 03/09/24 Yes Gonfa, Taye T, MD  atorvastatin  (LIPITOR) 80 MG tablet Take 1 tablet (80 mg total) by mouth at bedtime. 11/25/23  Yes Amoako, Prince, MD  B Complex-C-Zn-Folic Acid  (DIALYVITE /ZINC ) TABS Take 1 tablet by mouth Every Tuesday,Thursday,and Saturday with dialysis. 10/04/21  Yes [provider]  carvedilol  (COREG ) 3.125 MG tablet Take 1 tablet (3.125 mg total) by mouth 2 (two) times daily with a meal. 01/01/24  Yes Knox Perl, MD  clopidogrel  (PLAVIX ) 75 MG tablet Take 1 tablet (75 mg total) by mouth daily with breakfast. 11/25/23 11/24/24 Yes Amoako, Prince, MD  famotidine  (PEPCID ) 20 MG tablet Take 20 mg by mouth every other day.   Yes [provider]  fentaNYL  (DURAGESIC ) 50 MCG/HR Place 1 patch onto the skin every 3 (three) days. 07/25/23  Yes [provider]  folic acid  (FOLVITE ) 1 MG tablet Take 1 mg by mouth in the morning. 09/18/21  Yes [provider]  gabapentin  (NEURONTIN ) 100 MG capsule Take 100 mg by mouth 3 (three) times daily.   Yes [provider]  hydrALAZINE  (APRESOLINE ) 50 MG tablet Take 50 mg by mouth every 8 (eight) hours.   Yes [provider]  insulin  aspart (NOVOLOG  FLEXPEN) 100 UNIT/ML FlexPen Inject 0-12 Units into the skin See admin instructions. Inject 4 units into the skin with meals in addition to sliding scale as needed. Inject as per sliding scale: If 0 - 69 = 0 initiate hypoglycemia orders;  70 - 150 = 0;  151 - 200 = 2;  201 - 250 = 4;  251 - 300 = 6;  301 - 350 = 8;  351 - 400 = 10;  401 - 450 = 12.  If >400, give 12 units, recheck  in 1 hour. If still >400, call MD/NP for further orders. Subcutaneously with meals in addition to scheduled 4 untes with meals.   Yes [provider]  isosorbide  mononitrate (IMDUR ) 60 MG 24 hr tablet Take 1 tablet (60 mg total) by mouth daily. 01/21/23 02/11/24 Yes Knox Perl, MD  lidocaine -prilocaine  (EMLA ) cream Apply 1 application topically See admin instructions. Prior to Dialysis days Tuesday,Thursday and saturday 06/06/21  Yes [provider]  loratadine  (CLARITIN ) 10 MG tablet Take 10 mg by mouth in the morning.   Yes [provider]  magnesium  oxide (MAG-OX) 400 (240 Mg) MG tablet Take 1 tablet (400 mg total) by mouth daily. 02/04/24  Yes Amin, Sumayya, MD  mineral oil-hydrophilic petrolatum (AQUAPHOR) ointment Apply 1 Application topically as needed for dry skin or irritation (Upper thigh).   Yes [provider]  nitroGLYCERIN  (NITROSTAT ) 0.4 MG SL tablet Place 0.4 mg under the tongue every 5 (five) minutes as needed for chest pain. 01/06/22  Yes [provider]  oxyCODONE -acetaminophen  (PERCOCET /ROXICET) 5-325 MG tablet Take 1 tablet by mouth every 8 (eight) hours as needed for severe pain. 05/31/23  Yes Ghimire, Estil Heman, MD  promethazine  (PHENERGAN ) 12.5 MG suppository Place 12.5 mg rectally daily as needed for nausea or vomiting.   Yes [provider]  rOPINIRole  (REQUIP ) 2 MG tablet Take 2 mg by mouth at bedtime.   Yes [provider]  sevelamer   carbonate (RENVELA ) 800 MG tablet Take 2 tablets (1,600 mg total) by mouth 3 (three) times daily with meals. 02/04/24  Yes Luna Salinas, MD  tamsulosin  (FLOMAX ) 0.4 MG CAPS capsule Take 0.4 mg by mouth in the morning.   Yes [provider]     Vitals:   02/12/24 1100 02/12/24 1103 02/12/24 1112 02/12/24 1115  BP: 91/60 101/67 (!) 84/66 (!) 144/128  Pulse: 72 84 76 80  Resp: (!) 28 (!) 28 (!) 24 (!) 27  Temp:      TempSrc:      SpO2: 99% 98% 98% 96%  Weight:      Height:        Exam Gen patient is lethargic, trying to talk to someone not there Answers simple questions w/ yes/ no No rash, cyanosis or gangrene Sclera anicteric, throat clear  No jvd or bruits Chest clear bilat to bases, no rales/ wheezing RRR no MRG Abd soft ntnd no mass or ascites +bs GU nl male MS no joint effusions or deformity Ext 1+ bilat pitting LE edema, no other edema Neuro is as above     LUA AVF+bruit       Renal-related home meds: Renvela  2 AC 3 times daily Others: Eliquis , Lipitor, MVI, Plavix , Pepcid , Requip , Flomax , p.o. vancomycin     OP HD: [facility] 4h  B450   107kg   AVF  Heparin  none Last OP HD 5/31, post wt 112.5kg Partial HD 6/03, post wt 116.5kg (up 9kg), 1.5hr session, sent to ed Hectorol  2 mcg iv three times per week  Assessment/ Plan: Hypotension: in setting of recent Cdif colitis. Got fluid boluses in the ED. Midodrine  added 10mg  tid. Po vanc resumed here.  Recent Cdif colitis: continued on po vanc per pmd  Lethargy: answers questions, looks fatigued ESRD: on HD TTS. Has 90 min yesterday. Plan HD upstairs today.  BP: is not on any BP lowering meds at this time. Midodrine  added here yesterday/ this am.  Volume: has chronic volume overload issues. Up 6 kg by wts. No resp issues.  Anemia of esrd: Hb 10-12 here, follow.   Secondary hyperparathyroidism: CCa in range, phos a bit high. Cont binders ac.  PAF: on eliquis  CAD: sp PCI w/ DES in march 2025 PAD: sp bilat amputations      Larry Poag  MD [facility] 02/12/2024, 11:53 AM  Recent Labs  Lab 02/11/24 1445 02/12/24 0522  HGB 10.5* 10.5*  ALBUMIN  2.6*  --   CALCIUM  8.0* 8.5*  PHOS  --  8.8*  CREATININE 6.26* 7.44*  K 5.0 5.8*   Inpatient medications:  apixaban   5 mg Oral BID   atorvastatin   80 mg Oral QHS   Chlorhexidine  Gluconate Cloth  6 each Topical Q0600   clopidogrel   75 mg Oral Q breakfast   [START ON 02/13/2024] famotidine   20 mg Oral QODAY   insulin  aspart  0-5 Units Subcutaneous  QHS   insulin  aspart  0-6 Units Subcutaneous TID WC   midodrine   10 mg Oral TID WC   [START ON 02/13/2024] multivitamin  1 tablet Oral Once per day on Tuesday Thursday Saturday   rOPINIRole   2 mg Oral QHS   sevelamer  carbonate  1,600 mg Oral TID WC   tamsulosin   0.4 mg Oral q AM   vancomycin   125 mg Oral QID    lactated ringers  75 mL/hr at 02/12/24 1116   acetaminophen , melatonin, polyethylene glycol, prochlorperazine 

## 2024-02-12 NOTE — Significant Event (Addendum)
 Rapid Response Event Note   Reason for Call :  Code Blue page--cancelled en route to bedside.  Called by CN to assess pt en route to 5W  Initial Focused Assessment:  Upon arrival to room, pt alert though coloring grey in face and not in distress--not currently hooked up to pulse ox. Immediately asked for NRB as portable probe placed and found to be low 80s. Oxygen  slowly improved. Pt remains A&Ox4. Denies CP, dizziness, palpitations. Bilateral BKA noted. Skin warm and dry.  Patient with multiple non-sustained bradycardic episodes while in room after obtaining EKG. Noted to have decrease responsiveness during episodes.   102/61 (71) HR 80 RR 6 O2 81% RA  CBG 151  Interventions/Plan of Care:  NRB 15L EKG Labs  CCM consult, to bedside ICU tx 2H21, interventions following Cards consult  Event Summary:  MD Notified: S. Ariel Begun MD, Ebony Goldstein MD Call Time: [phone] Arrival Time: 1319 End Time: 1440  Ever Hiss, RN

## 2024-02-12 NOTE — Assessment & Plan Note (Signed)
 S/p bilateral BKA - Continue Plavix  and statin

## 2024-02-12 NOTE — Assessment & Plan Note (Signed)
 No concern of exacerbation at this time -As needed bronchodilator

## 2024-02-12 NOTE — Assessment & Plan Note (Signed)
-   Holding home Flomax  due to hypotension

## 2024-02-12 NOTE — Consult Note (Addendum)
 Advanced Heart Failure Team Consult Note   Primary Physician: Aloha Arnold, PA-C Cardiologist:  Knox Perl, MD  Reason for Consultation: Symptomatic bradycardia/pauses  HPI:    [name] is seen today for evaluation of symptomatic bradycardia/pauses at the request of Dr. Zaida Hertz with CCM. 68 y.o. male with history of PAF, PAD s/p bilateral BKA, DM II, ESRD on HD, HTN, CAD with PCI to RCA in 2016 and NSTEMI 03/25 s/p PCI/DES to p LCX and partially successful PTCA/shockwave lithotripsy p RCA, C.diff colitis 01/25, tobacco use.  Recently discharged 02/04/24 to SNF after admission for diarrhea d/t recurrent C. diff colitis, and acute metabolic encephalopathy with electrolyte abnormalities after missing HD.  Presented to ED yesterday with multiple complaints including weakness, decreased appetite, chest pain, diarrhea and shortness of breath. Went to dialysis earlier in the day. During session he was hypotensive and sent to ED via EMS. Home BP medications were stopped and he was started on midodrine . Lactic acid 2.5>2.4. Given NS, LR and IV albumin .  Per chart review he appeared more lethargic this afternoon. He became hypoxic and had bouts of bradycardia with transient drop in heart rate to 20s and multiple pauses on telemetry (longest > 6 seconds). During episodes he would have transient decreased responsiveness. Rapid response called. He was moved to the ICU for further management.   Home Medications Prior to Admission medications   Medication Sig Start Date End Date Taking? Authorizing Provider  amiodarone  (PACERONE ) 200 MG tablet Take 0.5 tablets (100 mg total) by mouth daily. Patient taking differently: Take 200 mg by mouth in the morning. 01/01/24  Yes Knox Perl, MD  amLODipine  (NORVASC ) 10 MG tablet Take 10 mg by mouth in the morning.   Yes [provider]  apixaban  (ELIQUIS ) 5 MG TABS tablet Take 1 tablet (5 mg total) by mouth 2 (two) times daily. 01/14/22 03/09/24 Yes  Gonfa, Taye T, MD  atorvastatin  (LIPITOR) 80 MG tablet Take 1 tablet (80 mg total) by mouth at bedtime. 11/25/23  Yes Amoako, Prince, MD  B Complex-C-Zn-Folic Acid  (DIALYVITE /ZINC ) TABS Take 1 tablet by mouth Every Tuesday,Thursday,and Saturday with dialysis. 10/04/21  Yes [provider]  carvedilol  (COREG ) 3.125 MG tablet Take 1 tablet (3.125 mg total) by mouth 2 (two) times daily with a meal. 01/01/24  Yes Knox Perl, MD  clopidogrel  (PLAVIX ) 75 MG tablet Take 1 tablet (75 mg total) by mouth daily with breakfast. 11/25/23 11/24/24 Yes Amoako, Prince, MD  famotidine  (PEPCID ) 20 MG tablet Take 20 mg by mouth every other day.   Yes [provider]  fentaNYL  (DURAGESIC ) 50 MCG/HR Place 1 patch onto the skin every 3 (three) days. 07/25/23  Yes [provider]  folic acid  (FOLVITE ) 1 MG tablet Take 1 mg by mouth in the morning. 09/18/21  Yes [provider]  gabapentin  (NEURONTIN ) 100 MG capsule Take 100 mg by mouth 3 (three) times daily.   Yes [provider]  hydrALAZINE  (APRESOLINE ) 50 MG tablet Take 50 mg by mouth every 8 (eight) hours.   Yes [provider]  insulin  aspart (NOVOLOG  FLEXPEN) 100 UNIT/ML FlexPen Inject 0-12 Units into the skin See admin instructions. Inject 4 units into the skin with meals in addition to sliding scale as needed. Inject as per sliding scale: If 0 - 69 = 0 initiate hypoglycemia orders;  70 - 150 = 0;  151 - 200 = 2;  201 - 250 = 4;  251 - 300 = 6;  301 -  350 = 8;  351 - 400 = 10;  401 - 450 = 12.  If >400, give 12 units, recheck in 1 hour. If still >400, call MD/NP for further orders. Subcutaneously with meals in addition to scheduled 4 untes with meals.   Yes [provider]  isosorbide  mononitrate (IMDUR ) 60 MG 24 hr tablet Take 1 tablet (60 mg total) by mouth daily. 01/21/23 02/11/24 Yes Knox Perl, MD  lidocaine -prilocaine  (EMLA ) cream Apply 1 application topically See admin instructions. Prior to Dialysis  days Tuesday,Thursday and saturday 06/06/21  Yes [provider]  loratadine  (CLARITIN ) 10 MG tablet Take 10 mg by mouth in the morning.   Yes [provider]  magnesium  oxide (MAG-OX) 400 (240 Mg) MG tablet Take 1 tablet (400 mg total) by mouth daily. 02/04/24  Yes Amin, Sumayya, MD  mineral oil-hydrophilic petrolatum (AQUAPHOR) ointment Apply 1 Application topically as needed for dry skin or irritation (Upper thigh).   Yes [provider]  nitroGLYCERIN  (NITROSTAT ) 0.4 MG SL tablet Place 0.4 mg under the tongue every 5 (five) minutes as needed for chest pain. 01/06/22  Yes [provider]  oxyCODONE -acetaminophen  (PERCOCET /ROXICET) 5-325 MG tablet Take 1 tablet by mouth every 8 (eight) hours as needed for severe pain. 05/31/23  Yes Ghimire, Estil Heman, MD  promethazine  (PHENERGAN ) 12.5 MG suppository Place 12.5 mg rectally daily as needed for nausea or vomiting.   Yes [provider]  rOPINIRole  (REQUIP ) 2 MG tablet Take 2 mg by mouth at bedtime.   Yes [provider]  sevelamer  carbonate (RENVELA ) 800 MG tablet Take 2 tablets (1,600 mg total) by mouth 3 (three) times daily with meals. 02/04/24  Yes Luna Salinas, MD  tamsulosin  (FLOMAX ) 0.4 MG CAPS capsule Take 0.4 mg by mouth in the morning.   Yes [provider]    Past Medical History: Past Medical History:  Diagnosis Date   Carotid artery occlusion 11/10/2010   LEFT CAROTID ENDARTERECTOMY   Complication of anesthesia    BP WENT UP AT [facility] "   COPD (chronic obstructive pulmonary disease) (HCC)    pt denies this dx as of 06/01/20 - no inhaler    Diabetes mellitus without complication (HCC)    Diverticulitis    Diverticulosis of colon (without mention of hemorrhage)    DJD (degenerative joint disease)    knees/hands/feet/back/neck   ESRD (end stage renal disease) on dialysis (HCC)    Fatty liver    Full dentures    GERD (gastroesophageal reflux disease)    H/O hiatal hernia     History of blood transfusion    with a past surical procedure per patient 06/01/20   Hyperlipidemia    Hypertension    Neuromuscular disorder (HCC)    peripheral neuropathy   Non-pressure chronic ulcer of other part of left foot limited to breakdown of skin (HCC) 11/12/2016   Osteomyelitis (HCC)    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 Atherectomy left popliteal artery July 2012.    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   Unstable angina (HCC) 09/16/2018   Wears glasses     Past Surgical History: Past Surgical History:  Procedure Laterality Date   ABDOMINAL AORTOGRAM W/LOWER EXTREMITY N/A 06/23/2021   Procedure: ABDOMINAL AORTOGRAM W/LOWER EXTREMITY;  Surgeon: Cody Das, MD;  [facility]: [facility];  Service: Cardiovascular;  Laterality: N/A;   AMPUTATION  11/05/2011   Procedure: AMPUTATION RAY;  Surgeon: Amada Backer, MD;  Location: [facility] OR;  Service: Orthopedics;  Laterality: Right;  Amputation of Right 4&5th Toes   AMPUTATION Left 11/26/2012   Procedure: AMPUTATION RAY;  Surgeon: Amada Backer, MD;  Location: [facility] OR;  Service: Orthopedics;  Laterality: Left;  fourth ray amputation   AMPUTATION Right 08/27/2014   Procedure: Transmetatarsal Amputation;  Surgeon: Timothy Ford, MD;  Location[facility] OR;  Service: Orthopedics;  Laterality: Right;   AMPUTATION Right 01/14/2015   Procedure: AMPUTATION BELOW KNEE;  Surgeon: Timothy Ford, MD;  Location: [facility] OR;  Service: Orthopedics;  Laterality: Right;   AMPUTATION Left 10/21/2015   Procedure: Left Foot 5th Ray Amputation;  Surgeon: Timothy Ford, MD;  Location: [facility] OR;  Service: Orthopedics;  Laterality: Left;   AMPUTATION Left 07/04/2022   Procedure: LEFT BELOW KNEE AMPUTATION;  Surgeon: Timothy Ford, MD;  Location: [facility] OR;  Service: Orthopedics;   Laterality: Left;   ANTERIOR FUSION CERVICAL SPINE  02/06/06   C4-5, C5-6, C6-7; SURGEON DR. MAX COHEN   AV FISTULA PLACEMENT Left 06/02/2020   Procedure: ARTERIOVENOUS (AV) FISTULA CREATION LEFT;  Surgeon: Adine Hoof, MD;  Location: [facility] OR;  Service: Vascular;  Laterality: Left;   BACK SURGERY     x 3   BASCILIC VEIN TRANSPOSITION Left 07/21/2020   Procedure: LEFT UPPER ARM ATERIOVENOUS SUPERFISTULALIZATION;  Surgeon: Adine Hoof, MD;  Location: [facility] OR;  Service: Vascular;  Laterality: Left;   BELOW KNEE LEG AMPUTATION Right    CARDIAC CATHETERIZATION  10/31/04   2009   CAROTID ENDARTERECTOMY  11/10/10   CAROTID ENDARTERECTOMY Left 11/10/2010   Subtotal occlusion of left internal carotid artery with left hemispheric transient ischemic attacks.   CAROTID STENT     CARPAL TUNNEL RELEASE Right 10/21/2013   Procedure: RIGHT CARPAL TUNNEL RELEASE;  Surgeon: Kemp Patter, MD;  Location: [facility];  Service: Orthopedics;  Laterality: Right;   CHOLECYSTECTOMY     COLON SURGERY     COLONOSCOPY     COLOSTOMY REVERSAL  05/21/2018   ileostomy reversal   CORONARY LITHOTRIPSY N/A 11/24/2023   Procedure: CORONARY LITHOTRIPSY;  Surgeon: Swaziland, Peter M, MD;  Location: [facility] INVASIVE CV LAB;  Service: Cardiovascular;  Laterality: N/A;   CORONARY STENT INTERVENTION N/A 11/24/2023   Procedure: CORONARY STENT INTERVENTION;  Surgeon: Swaziland, Peter M, MD;  Location: [facility] INVASIVE CV LAB;  Service: Cardiovascular;  Laterality: N/A;   CYSTOSCOPY WITH STENT PLACEMENT Bilateral 01/13/2018   Procedure: CYSTOSCOPY WITH BILATERAL URETERAL CATHETER PLACEMENT;  Surgeon: Andrez Banker, MD;  Location: [facility] ORS;  Service: Urology;  Laterality: Bilateral;   ESOPHAGEAL MANOMETRY Bilateral 07/19/2014   Procedure: ESOPHAGEAL MANOMETRY (EM);  Surgeon: Nannette Babe, MD;  Location: [facility];  Service: Gastroenterology;  Laterality: Bilateral;   EYE SURGERY Bilateral 2020   cataract    FEMORAL ARTERY STENT     x6   FINGER SURGERY     FOOT SURGERY  04/25/2016    EXCISION BASE 5TH METATARSAL AND PARTIAL CUBOID LEFT FOOT   HERNIA REPAIR     LEFT INGUINAL AND UMBILICAL REPAIRS   HERNIA REPAIR     I & D EXTREMITY Left 04/25/2016   Procedure: EXCISION BASE 5TH METATARSAL AND PARTIAL CUBOID LEFT FOOT;  Surgeon: Timothy Ford, MD;  Location: [facility];  Service: Orthopedics;  Laterality: Left;   ILEOSTOMY  01/13/2018   Procedure: ILEOSTOMY;  Surgeon: Adalberto Acton, MD;  Location: [facility]S;  Service: General;;   ILEOSTOMY CLOSURE N/A 05/21/2018   Procedure: ILEOSTOMY REVERSAL ERAS PATHWAY;  Surgeon: Adalberto Acton, MD;  Location: [facility];  Service: General;  Laterality: N/A;   IR RADIOLOGIST EVAL & MGMT  11/19/2017   IR RADIOLOGIST EVAL & MGMT  12/03/2017   IR RADIOLOGIST EVAL & MGMT  12/18/2017   JOINT REPLACEMENT Right 2001   Total knee   LAMINECTOMY     X 3 LUMBAR AND X 2 CERVICAL SPINE OPERATIONS   LAPAROSCOPIC CHOLECYSTECTOMY W/ CHOLANGIOGRAPHY  11/09/04   SURGEON DR. Annetta Barter   LEFT HEART CATH AND CORONARY ANGIOGRAPHY N/A 09/16/2018   Procedure: LEFT HEART CATH AND CORONARY ANGIOGRAPHY;  Surgeon: Cody Das, MD;  [facility];  Service: Cardiovascular;  Laterality: N/A;   LEFT HEART CATH AND CORONARY ANGIOGRAPHY N/A 11/24/2023   Procedure: LEFT HEART CATH AND CORONARY ANGIOGRAPHY;  Surgeon: Swaziland, Peter M, MD;  Location: [facility] INVASIVE CV LAB;  Service: Cardiovascular;  Laterality: N/A;   LEFT HEART CATHETERIZATION WITH CORONARY ANGIOGRAM N/A 10/29/2014   Procedure: LEFT HEART CATHETERIZATION WITH CORONARY ANGIOGRAM;  Surgeon: Jessica Morn, MD;  Location: [facility] CATH LAB;  Service: Cardiovascular;  Laterality: N/A;   LIGATION OF COMPETING BRANCHES OF ARTERIOVENOUS FISTULA Left 07/21/2020   Procedure: LIGATION OF COMPETING BRANCHES OF LEFT UPPER ARM ARTERIOVENOUS FISTULA;  Surgeon: Adine Hoof, MD;  Location: [facility] OR;  Service: Vascular;   Laterality: Left;   LOWER EXTREMITY ANGIOGRAM N/A 03/19/2012   Procedure: LOWER EXTREMITY ANGIOGRAM;  Surgeon: Odie Benne, MD;  Location: [facility] CATH LAB;  Service: Cardiovascular;  Laterality: N/A;   LOWER EXTREMITY ANGIOGRAPHY N/A 06/20/2021   Procedure: LOWER EXTREMITY ANGIOGRAPHY;  Surgeon: Cody Das, MD;  [facility];  Service: Cardiovascular;  Laterality: N/A;   NECK SURGERY     PARTIAL COLECTOMY N/A 01/13/2018   Procedure: LAPAROSCOPIC ASSISTED   SIGMOID COLECTOMY ILEOSTOMY;  Surgeon: Adalberto Acton, MD;  Location: [facility]S;  Service: General;  Laterality: N/A;   PENILE PROSTHESIS IMPLANT  08/14/05   INFRAPUBIC INSERTION OF INFLATABLE PENILE PROSTHESIS; SURGEON DR. Luster Salters   PENILE PROSTHESIS IMPLANT     PERCUTANEOUS CORONARY STENT INTERVENTION (PCI-S) Right 10/29/2014   Procedure: PERCUTANEOUS CORONARY STENT INTERVENTION (PCI-S);  Surgeon: Jessica Morn, MD;  Location: [facility] CATH LAB;  Service: Cardiovascular;  Laterality: Right;   PERIPHERAL VASCULAR INTERVENTION Left 06/23/2021   Procedure: PERIPHERAL VASCULAR INTERVENTION;  Surgeon: Cody Das, MD;  [facility];  Service: Cardiovascular;  Laterality: Left;   REMOVAL OF PENILE PROSTHESIS N/A 06/14/2021   Procedure: Removal of THREE piece inflatable penile prosthesis;  Surgeon: Samson Croak, MD;  Location: [facility] OR;  Service: Urology;  Laterality: N/A;   SHOULDER ARTHROSCOPY     SPINE SURGERY     TOE AMPUTATION Left    TONSILLECTOMY     TOTAL KNEE ARTHROPLASTY  07/2002   RIGHT KNEE ; SURGEON  DR. [name] ALSO HAD ARTHROSCOPIC RIGHT KNEE IN  10/2001   TOTAL KNEE ARTHROPLASTY     ULNAR NERVE TRANSPOSITION Right 10/21/2013   Procedure: RIGHT ELBOW  ULNAR NERVE DECOMPRESSION;  Surgeon: Kemp Patter, MD;  Location: [facility];  Service: Orthopedics;  Laterality: Right;    Family History: Family History  Problem Relation Age of Onset  Heart disease Father         Before age 55-  CAD, BPG   Diabetes Father        Amputation   Cancer Father        PROSTATE   Hyperlipidemia Father    Hypertension Father    Heart attack Father        Triple BPG   Varicose Veins Father    Cancer Sister        Breast   Hyperlipidemia Sister    Hypertension Sister    Heart attack Brother    Colon cancer Brother    Diabetes Brother    Heart disease Brother 26       A-Fib. Before age 73   Hyperlipidemia Brother    Hypertension Brother    Hypertension Son    Arthritis Other        GRANDMOTHER   Hypertension Other        OTHER FAMILY MEMBERS    Social History: Social History   Socioeconomic History   Marital status: Married    Spouse name: Not on file   Number of children: 3   Years of education: Not on file   Highest education level: Not on file  Occupational History   Occupation: TRUCK DRIVER    Employer: UNEMPLOYED  Tobacco Use   Smoking status: Every Day    Current packs/day: 0.50    Average packs/day: 0.5 packs/day for 35.0 years (17.5 ttl pk-yrs)    Types: Cigarettes   Smokeless tobacco: Never  Vaping Use   Vaping status: Every Day   Substances: Nicotine   Substance and Sexual Activity   Alcohol  use: Not Currently    Comment: "not in a long time"   Drug use: Never   Sexual activity: Yes    Birth control/protection: Implant    Comment: penile implant  Other Topics Concern   Not on file  Social History Narrative   ** Merged History Encounter **       HEAVY SMOKER AND CONTINUES TO SMOKE 1 PPD. DOES NOT EXERCISE REGULARLY.  Wife [phone] Clance Crigler). Has 2 sons and daughter. Still active       Social Drivers of Corporate investment banker Strain: Not on file  Food Insecurity: No Food Insecurity (02/02/2024)   Hunger Vital Sign    Worried About Running Out of Food in the Last Year: Never true    Ran Out of Food in the Last Year: Never true  Transportation Needs: No Transportation Needs (02/02/2024)   PRAPARE -  Administrator, Civil Service (Medical): No    Lack of Transportation (Non-Medical): No  Physical Activity: Not on file  Stress: Not on file  Social Connections: Moderately Isolated (02/02/2024)   Social Connection and Isolation Panel [NHANES]    Frequency of Communication with Friends and Family: More than three times a week    Frequency of Social Gatherings with Friends and Family: More than three times a week    Attends Religious Services: Never    Database administrator or Organizations: No    Attends Banker Meetings: Never    Marital Status: Married    Allergies:  Allergies  Allergen Reactions   Contrast Media [Iodinated Contrast Media] Shortness Of Breath and Other (See Comments)    Difficulty breathing and altered mental status     Ivp Dye [Iodinated Contrast Media] Anaphylaxis, Shortness Of Breath and Other (See Comments)    Breathing problems,  altered mental state    Latex Rash and Other (See Comments)    A severe rash appears after the first 24 hours of being placed   Tape Other (See Comments) and Rash    Rash after 1 day of use  Do not leave on longer then 3 days with out be changed    Objective:    Vital Signs:   Temp:  [97.4 F (36.3 C)-98.7 F (37.1 C)] 97.4 F (36.3 C) (06/04 1217) Pulse Rate:  [34-96] 55 (06/04 1245) Resp:  [8-29] 12 (06/04 1245) BP: (66-144)/(35-128) 122/82 (06/04 1221) SpO2:  [82 %-100 %] 97 % (06/04 1245) Weight:  [113.4 kg] 113.4 kg (06/03 1517)    Weight change: Filed Weights   02/11/24 1517  Weight: 113.4 kg    Intake/Output:   Intake/Output Summary (Last 24 hours) at 02/12/2024 1442 Last data filed at 02/11/2024 2345 Gross per 24 hour  Intake 600 ml  Output --  Net 600 ml      Physical Exam    General: Acute on chronically ill appearing Neuro: Awake. Lethargic. Exam limited - seen during resuscitation   Telemetry   Currently Afib 80s   Labs   Basic Metabolic Panel: Recent Labs   Lab 02/11/24 1445 02/12/24 0522  NA 133* 133*  K 5.0 5.8*  CL 93* 90*  CO2 24 23  GLUCOSE 240* 183*  BUN 41* 52*  CREATININE 6.26* 7.44*  CALCIUM  8.0* 8.5*  MG  --  2.2  PHOS  --  8.8*    Liver Function Tests: Recent Labs  Lab 02/11/24 1445  AST 22  ALT 17  ALKPHOS 82  BILITOT 0.8  PROT 5.6*  ALBUMIN  2.6*   No results for input(s): "LIPASE", "AMYLASE" in the last 168 hours. No results for input(s): "AMMONIA" in the last 168 hours.  CBC: Recent Labs  Lab 02/11/24 1445 02/12/24 0522  WBC 13.0* 16.0*  NEUTROABS 10.6*  --   HGB 10.5* 10.5*  HCT 34.2* 33.6*  MCV 94.0 94.1  PLT 278 307    Cardiac Enzymes: No results for input(s): "CKTOTAL", "CKMB", "CKMBINDEX", "TROPONINI" in the last 168 hours.  BNP: BNP (last 3 results) No results for input(s): "BNP" in the last 8760 hours.  ProBNP (last 3 results) No results for input(s): "PROBNP" in the last 8760 hours.   CBG: Recent Labs  Lab 02/11/24 2232 02/12/24 0839 02/12/24 1225 02/12/24 1319  GLUCAP 228* 173* 150* 151*    Coagulation Studies: Recent Labs    02/11/24 1445  LABPROT 27.8*  INR 2.6*     Imaging   DG Chest 2 View Result Date: 02/11/2024 CLINICAL DATA:  Hypertension.  Weakness. EXAM: CHEST - 2 VIEW COMPARISON:  November 23, 2023. FINDINGS: Stable cardiomegaly. Right lung is clear. Small left pleural effusion is noted with adjacent atelectasis. Bony thorax is unremarkable. IMPRESSION: Small left pleural effusion with adjacent atelectasis. Electronically Signed   By: Rosalene Colon M.D.   On: 02/11/2024 16:37     Medications:     Current Medications:  apixaban   5 mg Oral BID   atorvastatin   80 mg Oral QHS   atropine        Chlorhexidine  Gluconate Cloth  6 each Topical Q0600   clopidogrel   75 mg Oral Q breakfast   [START ON 02/13/2024] famotidine   20 mg Oral QODAY   insulin  aspart  0-5 Units Subcutaneous QHS   insulin  aspart  0-6 Units Subcutaneous TID WC   midodrine   10 mg  Oral TID  WC   [START ON 02/13/2024] multivitamin  1 tablet Oral Once per day on Tuesday Thursday Saturday   rOPINIRole   2 mg Oral QHS   sevelamer  carbonate  1,600 mg Oral TID WC   tamsulosin   0.4 mg Oral q AM   vancomycin   125 mg Oral QID    Infusions:  lactated ringers  75 mL/hr at 02/12/24 1116   norepinephrine (LEVOPHED) Adult infusion 2 mcg/min (02/12/24 1439)      Patient Profile   68 y.o. male with history of PAD s/p bilateral BKA, ESRD on HD, recurrent C.diff colitis on treatment, CAD, PAF, DM II, tobacco use.  Admitted after he became hypotensive during HD session. Transferred to the ICU afternoon of 06/04 for bradycardia/pauses and decreased responsiveness.  Assessment/Plan   Severe bradycardia/pauses -transient bradycardia down to 20s with pauses up to 6 seconds in duration -stop home amiodarone  and beta blocker -Now on 3 of dopamine -Rhythm stable, Afib 80s-90s -Hold off on TVP for now as long as rhythm remains stable. Plan to continue dopamine overnight.  2. Shock -Etiology uncertain. ? Mixed cardiogenic/septic shock. -Lactic acid up to 5.5, trend to clearance -On Dopamine + NE, wean NE as able -Has been on vancomycin  for Cdiff, also adding zosyn  empirically per CCM -Blood cultures pending -WBCs 16K, no fever -Check echo  3. CAD  -Stents to RCA in 2016 -NSTEMI 03/25 s/p PCI/DES LCX, PTCA/shockwave lithotripsy p RCA which was partially successful -On plavix . No aspirin  with need for anticoagulation  4. AF -History of paroxysmal afib -Back in AF this admit, rate currently controlled but has been bradycardic as above -Hold amiodarone  and beta blocker -Heparin  gtt  5. ESRD on HD -Appears volume up. Management with HD, CRRT for now   Length of Stay: 1  FINCH, LINDSAY N, PA-C  02/12/2024, 2:42 PM    Advanced Heart Failure Team Pager [phone] (M-F; 7a - 5p)  Please contact [facility] for night-coverage after hours (4p -7a ) and weekends on [email or web address]    Patient seen with PA, I formulated the plan and agree with the above note.   Patient is chronically ill with ESRD, PAD s/p bilateral BKAs, CAD s/p multiple PCIs including in 3/25, recent C difficile colitis, PAF.    Patient was admitted with hypotension after HD, started on midodrine .  Noted to have long pauses on telemetry today.   He was brought to 2H, started on dopamine @ 3, now in atrial fibrillation with rate 90s-100s. He was begun on CVVH.   General: NAD Neck: JPV 10-12 cm, no thyromegaly or thyroid  nodule.  Lungs: Clear to auscultation bilaterally with normal respiratory effort. CV: Nondisplaced PMI.  Heart irregular S1/S2, no S3/S4, 1/6 SEM RUSB.  No peripheral edema.  No carotid bruit.   Abdomen: Soft, nontender, no hepatosplenomegaly, no distention.  Skin: Intact without lesions or rashes.  Neurologic: Mildly confused but follows commands.  Psych: Normal affect. Extremities: No clubbing or cyanosis.  HEENT: Normal.   1. Bradycardia: Patient is on Coreg  and amiodarone  at home.  Noted to have long pauses up to 6 seconds today.  No syncope. HR now 90s-100s on dopamine.  - Stop amiodarone  and Coreg .  - Continue dopamine @ 3 for now.  Will try to wean in the morning.  - If he has further bradycardic episodes, will need TTVP.  - May need PPM though ESRD and recent infection make this higher risk.  2. Shock: Hypotensive with HD, hypotensive earlier today associated with bradycardia  and long pauses.  ?Infection/sepsis playing a role.  WBCs 16, afebrile. Echo in 3/25 showed EF 60-65%, mild LVH, mild RVE with normal systolic function, mild AS. On dopamine 3, NE 6.  - Cover with broad spectrum abx, vancomycin /Zosyn .  Per CCM.  - Continue midodrine  10 tid.  - Wean NE as able.  - Repeat echo.  3. C difficile diarrhea: Completing course of po vancomycin .  4. ESRD: Mildly volume overloaded on exam today. - Starting CVVH.  5. Atrial fibrillation: Currently in AF with rate in 90s.   -  No nodal blockers with recent bradycardia.  - Continue heparin  gtt.  6. CAD: NSTEMI 3/25 s/p PCI/DES LCX, PTCA/shockwave lithotripsy p RCA. No chest pain.  - Continue Plavix .  Has not been on ASA with anticoagulation.  - Continue atorvastatin .   CRITICAL CARE Performed by: Peder Bourdon  Total critical care time: 70 minutes  Critical care time was exclusive of separately billable procedures and treating other patients.  Critical care was necessary to treat or prevent imminent or life-threatening deterioration.  Critical care was time spent personally by me on the following activities: development of treatment plan with patient and/or surrogate as well as nursing, discussions with consultants, evaluation of patient's response to treatment, examination of patient, obtaining history from patient or surrogate, ordering and performing treatments and interventions, ordering and review of laboratory studies, ordering and review of radiographic studies, pulse oximetry and re-evaluation of patient's condition.   Peder Bourdon 02/12/2024 7:39 PM

## 2024-02-12 NOTE — ED Notes (Addendum)
 Pt refused oral medications. States he doesn't think he can swallow them. Pt offered water  and he refused. MD Ariel Begun notified

## 2024-02-12 NOTE — Progress Notes (Signed)
 Pt moved to 2H21- came back to complete stat echo- pt in the middle of sterile procedure @ 3:22pm.

## 2024-02-12 NOTE — Procedures (Signed)
 Arterial Catheter Insertion Procedure Note  [name]  [id number]  28-Apr-1956  Date:02/12/24  Time:3:27 PM    Provider Performing: [name]    Procedure: Insertion of Arterial Line (21308) with US  guidance (65784)   Indication(s) Blood pressure monitoring and/or need for frequent ABGs  Consent Unable to obtain consent due to emergent nature of procedure.  Anesthesia None   Time Out Verified patient identification, verified procedure, site/side was marked, verified correct patient position, special equipment/implants available, medications/allergies/relevant history reviewed, required imaging and test results available.   Sterile Technique Maximal sterile technique including full sterile barrier drape, hand hygiene, sterile gown, sterile gloves, mask, hair covering, sterile ultrasound probe cover (if used).   Procedure Description Area of catheter insertion was cleaned with chlorhexidine  and draped in sterile fashion. With real-time ultrasound guidance an arterial catheter was placed into the right axillary artery.  Appropriate arterial tracings confirmed on monitor.     Complications/Tolerance None; patient tolerated the procedure well.   EBL Minimal   Specimen(s) None   [name], PA-C [facility] 02/12/24 3:28 PM  Please see [email or web address] for pager details.  From 7A-7P if no response, please call [phone] After hours, please call ELink [phone]

## 2024-02-12 NOTE — Assessment & Plan Note (Addendum)
 Worsening lactic acidosis likely with recurrent hypotension. No other obvious infection at this time. History of C. difficile infection recently but no ongoing diarrhea. - Will do CT chest, abdomen and pelvis to rule out any obvious infection - Preliminary blood cultures negative - Continue to monitor

## 2024-02-12 NOTE — Progress Notes (Signed)
 eLink Physician-Brief Progress Note Patient Name: [name] DOB: 1956/05/27 MRN: [id number]   Date of Service  02/12/2024  HPI/Events of Note  68 y.o. male with medical history significant for ESRD TTS, hypertension, hyperlipidemia, type 2 diabetes, COPD, peripheral artery disease status post bilateral below the knee amputation, paroxysmal A-fib on Eliquis , coronary artery disease status post PCI with drug-eluting stent placement on 11/24/2023 on Plavix  (also on Eliquis -presumably completed 1 month of aspirin  81 mg), history of C. difficile colitis in January 2025, recently admitted on 01/31/2024 through 02/04/2024 due to diarrhea secondary to C. difficile colitis and abdominal pain, who presents to the ER via EMS from hemodialysis center due to hypotension.   Norepinephrine at 30 mcg, on ongoing dopamine  eICU Interventions  MAP goal greater than 65 Albumin  25% bolus Calcium  supplementation Add vasopressin   0345 - Refractory Hypotension. Add Epi, maintain NE/Vaso, Fix Dopamine to 5. Pulling on CRRT  0448 -A-fib with rates now in the 120s.  Expected with the dopamine/epi.  No changes at this time.  Intervention Category Intermediate Interventions: Hypotension - evaluation and management  Evelyne Makepeace 02/12/2024, 10:15 PM

## 2024-02-12 NOTE — Progress Notes (Signed)
 Progress Note   Patient: [name] ZOX:[id number] DOB: 1956/06/29 DOA: 02/11/2024     1 DOS: the patient was seen and examined on 02/12/2024   Brief hospital course: Taken from H&P.  [name] is a 68 y.o. male with medical history significant for ESRD TTS, hypertension, hyperlipidemia, type 2 diabetes, COPD, peripheral artery disease status post bilateral below the knee amputation, paroxysmal A-fib on Eliquis , coronary artery disease status post PCI with drug-eluting stent placement on 11/24/2023 on Plavix  (also on Eliquis -presumably completed 1 month of aspirin  81 mg), history of C. difficile colitis in January 2025, recently admitted on 01/31/2024 through 02/04/2024 due to diarrhea secondary to C. difficile colitis and abdominal pain, who presents to the ER via EMS from hemodialysis center due to hypotension.  His session was aborted after 1 hour due to the hypotension.   On presentation systolic blood pressure in 80s, some improvement with midodrine .  Labs with leukocytosis at 13, sodium 133, glucose 240, bicarb 24.  Home antihypertensives were held and started on midodrine  10 mg 3 times daily.  6/4: Blood pressure at 89/55, labs with potassium of 5.8, renal function consistent with ESRD, worsening leukocytosis at 16, lactic acid at 2.6, blood cultures pending, chest x-ray with left small pleural with some adjacent atelectasis.  Patient was feeling very lethargic, and another bolus of 500 cc was given due to decreasing blood pressure, resulted in some improvement initially. Later when patient transferred to floor from ED, he had a 12 to 15-second sinus pause and became unresponsive, rapid response was initially called and when they were about to start recording him he became responsive again.  Still very lethargic and becoming hypotensive.  Intermittent bradycardia.  Echocardiogram and troponin was ordered.  PCCM was consulted and patient will be transferred to ICU for pressors.  Assessment  and Plan: * Hypotension Persistent intermittent hypotension, intermittent bradycardia with sinus pauses resulted in rapid response called. Transient improvement with IV fluid bolus. Patient received multiple doses of midodrine . PCCM was consulted as patient likely need pressors.  ESRD (end stage renal disease) on dialysis [facility]) Patient just completed 1 hour before becoming hypotensive during his prior session yesterday. -Nephrology is on board  Lactic acidosis Worsening lactic acidosis likely with recurrent hypotension. No other obvious infection at this time. History of C. difficile infection recently but no ongoing diarrhea. - Will do CT chest, abdomen and pelvis to rule out any obvious infection - Preliminary blood cultures negative - Continue to monitor  History of Clostridioides difficile colitis Patient has recent history of C. difficile colitis and completed p.o. vancomycin  on 02/10/2024.  No ongoing symptoms  Diabetes mellitus without complication (HCC) - Continue with SSI  Paroxysmal atrial fibrillation (HCC) Not on any nodal blocking agent. Currently in sinus rhythm with intermittent sinus bradycardia. - Continue with Eliquis   Anemia due to chronic kidney disease Hemoglobin seems stable around baseline -Continue to monitor  Leukocytosis Worsening leukocytosis.  No obvious infection. -CT abdomen and chest was ordered to rule out any underlying source.  Preliminary blood cultures negative.  PAD (peripheral artery disease) (HCC) S/p bilateral BKA - Continue Plavix  and statin  Restless legs syndrome (RLS) - Continue home ropinirole   Hyperlipidemia - Continue home statin  BPH (benign prostatic hyperplasia) - Holding home Flomax  due to hypotension  COPD (chronic obstructive pulmonary disease) (HCC) No concern of exacerbation at this time -As needed bronchodilator  GERD (gastroesophageal reflux disease) - Continue home H2 blocker      Subjective: Patient  was seen and examined in today.  Appears lethargic.  Oriented to self only.  Denies any pain or recent illness.  Denies nausea, vomiting or diarrhea.  Physical Exam: Vitals:   02/12/24 1115 02/12/24 1217 02/12/24 1221 02/12/24 1245  BP: (!) 144/128  122/82   Pulse: 80   (!) 55  Resp: (!) 27  (!) 29 12  Temp:  (!) 97.4 F (36.3 C)    TempSrc:  Oral    SpO2: 96%   97%  Weight:      Height:       General.  Ill-appearing gentleman, in no acute distress. Pulmonary.  Lungs clear bilaterally, normal respiratory effort. CV.  Regular rate and rhythm, intermittent sinus bradycardia Abdomen.  Soft, nontender, nondistended, BS positive. CNS.  Somnolent, oriented to self only, no apparent deficit Extremities.  Bilateral BKA Psychiatry.  Judgment and insight appears impaired.  Data Reviewed: Prior data reviewed  Family Communication: Talked with wife on phone.  Disposition: Status is: Inpatient Remains inpatient appropriate because: Severity of illness.  Planned Discharge Destination: Skilled nursing facility  Time spent: 55 minutes  This record has been created using Conservation officer, historic buildings. Errors have been sought and corrected,but may not always be located. Such creation errors do not reflect on the standard of care.   Author: Luna Salinas, MD 02/12/2024 2:56 PM  For on call review [email or web address].

## 2024-02-12 NOTE — Assessment & Plan Note (Signed)
 Not on any nodal blocking agent. Currently in sinus rhythm with intermittent sinus bradycardia. - Continue with Eliquis 

## 2024-02-12 NOTE — ED Notes (Signed)
 Nephrology at bedside

## 2024-02-12 NOTE — Progress Notes (Addendum)
 Pharmacy Antibiotic Note  [name] is a 68 y.o. male admitted on 02/11/2024 with sepsis.  Pharmacy has been consulted for vancomycin  and zosyn  dosing.  Recent admission for Cdiff colitis - now representing with weakness and hypotension at dialysis. Had rapid response episode for bradycardia and hypotension - now transferred to ICU. WBC increasing to 16, LA 2.4 now trending up to 5.5. Hx ESRD - now plan for CRRT.   Per notes, plan to continue PO vanc for Cdiff treatment for now.   Plan: Vancomycin  2g IV once then 1250 mg IV every 24 hours while on CRRT Zosyn  3.375g IV every 6 hours  while on CRRT Monitor cx results, clinical pic, and vanc levels as appropriate   Height: 6\' 4"  (193 cm) Weight: 113.4 kg (250 lb) IBW/kg (Calculated) : 86.8  Temp (24hrs), Avg:98.1 F (36.7 C), Min:97.4 F (36.3 C), Max:98.7 F (37.1 C)  Recent Labs  Lab 02/11/24 1445 02/11/24 1452 02/11/24 1745 02/12/24 0123 02/12/24 0522 02/12/24 1343  WBC 13.0*  --   --   --  16.0*  --   CREATININE 6.26*  --   --   --  7.44*  --   LATICACIDVEN  --  2.4* 2.5* 2.6*  --  5.5*    Estimated Creatinine Clearance: 12.9 mL/min (A) (by C-G formula based on SCr of 7.44 mg/dL (H)).    Allergies  Allergen Reactions   Contrast Media [Iodinated Contrast Media] Shortness Of Breath and Other (See Comments)    Difficulty breathing and altered mental status     Ivp Dye [Iodinated Contrast Media] Anaphylaxis, Shortness Of Breath and Other (See Comments)    Breathing problems, altered mental state    Latex Rash and Other (See Comments)    A severe rash appears after the first 24 hours of being placed   Tape Other (See Comments) and Rash    Rash after 1 day of use  Do not leave on longer then 3 days with out be changed    Antimicrobials this admission: Vanc 6/4 >>  Zosyn  6/4 >>  Vanc PO (from PTA) >>   Dose adjustments this admission: N/A  Microbiology results: 6/3 BCx: ngtd 6/3 MRSA PCR: ordered  Thank  you for allowing pharmacy to participate in this patient's care,  Nieves Bars, PharmD, BCCCP Clinical Pharmacist  Phone: [phone] 02/12/2024 4:33 PM  Please check AMION for all [facility] phone numbers After 10:00 PM, call Main Pharmacy [phone]

## 2024-02-13 ENCOUNTER — Encounter (HOSPITAL_COMMUNITY): Payer: Self-pay | Admitting: Cardiology

## 2024-02-13 ENCOUNTER — Inpatient Hospital Stay (HOSPITAL_COMMUNITY)

## 2024-02-13 ENCOUNTER — Encounter (HOSPITAL_COMMUNITY)
Admission: EM | Disposition: A | Discharge status: Skilled Nursing Facility | Payer: Self-pay | Source: Ambulatory Visit | Attending: Critical Care Medicine

## 2024-02-13 DIAGNOSIS — J9601 Acute respiratory failure with hypoxia: Secondary | ICD-10-CM

## 2024-02-13 DIAGNOSIS — R9431 Abnormal electrocardiogram [ECG] [EKG]: Secondary | ICD-10-CM | POA: Diagnosis not present

## 2024-02-13 DIAGNOSIS — I3139 Other pericardial effusion (noninflammatory): Secondary | ICD-10-CM

## 2024-02-13 DIAGNOSIS — R570 Cardiogenic shock: Secondary | ICD-10-CM

## 2024-02-13 DIAGNOSIS — E8721 Acute metabolic acidosis: Secondary | ICD-10-CM

## 2024-02-13 DIAGNOSIS — A419 Sepsis, unspecified organism: Principal | ICD-10-CM

## 2024-02-13 HISTORY — PX: PERICARDIOCENTESIS: CATH118255

## 2024-02-13 HISTORY — PX: TEMPORARY PACEMAKER: CATH118268

## 2024-02-13 LAB — GLUCOSE, CAPILLARY
Glucose-Capillary: 145 mg/dL — ABNORMAL HIGH (ref 70–99)
Glucose-Capillary: 164 mg/dL — ABNORMAL HIGH (ref 70–99)
Glucose-Capillary: 199 mg/dL — ABNORMAL HIGH (ref 70–99)
Glucose-Capillary: 230 mg/dL — ABNORMAL HIGH (ref 70–99)

## 2024-02-13 LAB — POCT I-STAT 7, (LYTES, BLD GAS, ICA,H+H)
Acid-base deficit: 14 mmol/L — ABNORMAL HIGH (ref 0.0–2.0)
Acid-base deficit: 9 mmol/L — ABNORMAL HIGH (ref 0.0–2.0)
Acid-base deficit: 4 mmol/L — ABNORMAL HIGH (ref 0.0–2.0)
Bicarbonate: 11.7 mmol/L — ABNORMAL LOW (ref 20.0–28.0)
Bicarbonate: 15.4 mmol/L — ABNORMAL LOW (ref 20.0–28.0)
Bicarbonate: 21 mmol/L (ref 20.0–28.0)
Calcium, Ion: 1.08 mmol/L — ABNORMAL LOW (ref 1.15–1.40)
Calcium, Ion: 1.09 mmol/L — ABNORMAL LOW (ref 1.15–1.40)
Calcium, Ion: 1.14 mmol/L — ABNORMAL LOW (ref 1.15–1.40)
HCT: 35 % — ABNORMAL LOW (ref 39.0–52.0)
HCT: 29 % — ABNORMAL LOW (ref 39.0–52.0)
HCT: 29 % — ABNORMAL LOW (ref 39.0–52.0)
Hemoglobin: 11.9 g/dL — ABNORMAL LOW (ref 13.0–17.0)
Hemoglobin: 9.9 g/dL — ABNORMAL LOW (ref 13.0–17.0)
Hemoglobin: 9.9 g/dL — ABNORMAL LOW (ref 13.0–17.0)
O2 Saturation: 96 %
O2 Saturation: 97 %
O2 Saturation: 98 %
Patient temperature: 97.4
Patient temperature: 97.1
Patient temperature: 98.1
Potassium: 5.9 mmol/L — ABNORMAL HIGH (ref 3.5–5.1)
Potassium: 5.3 mmol/L — ABNORMAL HIGH (ref 3.5–5.1)
Potassium: 4.9 mmol/L (ref 3.5–5.1)
Sodium: 131 mmol/L — ABNORMAL LOW (ref 135–145)
Sodium: 136 mmol/L (ref 135–145)
Sodium: 135 mmol/L (ref 135–145)
TCO2: 12 mmol/L — ABNORMAL LOW (ref 22–32)
TCO2: 16 mmol/L — ABNORMAL LOW (ref 22–32)
TCO2: 22 mmol/L (ref 22–32)
pCO2 arterial: 25.2 mmHg — ABNORMAL LOW (ref 32–48)
pCO2 arterial: 27.2 mmHg — ABNORMAL LOW (ref 32–48)
pCO2 arterial: 36.7 mmHg (ref 32–48)
pH, Arterial: 7.27 — ABNORMAL LOW (ref 7.35–7.45)
pH, Arterial: 7.358 (ref 7.35–7.45)
pH, Arterial: 7.364 (ref 7.35–7.45)
pO2, Arterial: 90 mmHg (ref 83–108)
pO2, Arterial: 85 mmHg (ref 83–108)
pO2, Arterial: 112 mmHg — ABNORMAL HIGH (ref 83–108)

## 2024-02-13 LAB — ECHOCARDIOGRAM LIMITED
Height: 76 in
Weight: 4197.56 [oz_av]

## 2024-02-13 LAB — RENAL FUNCTION PANEL
Albumin: 3.4 g/dL — ABNORMAL LOW (ref 3.5–5.0)
Albumin: 2.8 g/dL — ABNORMAL LOW (ref 3.5–5.0)
Anion gap: 19 — ABNORMAL HIGH (ref 5–15)
Anion gap: 19 — ABNORMAL HIGH (ref 5–15)
BUN: 45 mg/dL — ABNORMAL HIGH (ref 8–23)
BUN: 44 mg/dL — ABNORMAL HIGH (ref 8–23)
CO2: 19 mmol/L — ABNORMAL LOW (ref 22–32)
CO2: 21 mmol/L — ABNORMAL LOW (ref 22–32)
Calcium: 9.1 mg/dL (ref 8.9–10.3)
Calcium: 8.9 mg/dL (ref 8.9–10.3)
Chloride: 97 mmol/L — ABNORMAL LOW (ref 98–111)
Chloride: 98 mmol/L (ref 98–111)
Creatinine, Ser: 5.23 mg/dL — ABNORMAL HIGH (ref 0.61–1.24)
Creatinine, Ser: 4.89 mg/dL — ABNORMAL HIGH (ref 0.61–1.24)
GFR, Estimated: 11 mL/min — ABNORMAL LOW (ref >60)
GFR, Estimated: 12 mL/min — ABNORMAL LOW (ref >60)
Glucose, Bld: 187 mg/dL — ABNORMAL HIGH (ref 70–99)
Glucose, Bld: 238 mg/dL — ABNORMAL HIGH (ref 70–99)
Phosphorus: 6.6 mg/dL — ABNORMAL HIGH (ref 2.5–4.6)
Phosphorus: 6.9 mg/dL — ABNORMAL HIGH (ref 2.5–4.6)
Potassium: 5.4 mmol/L — ABNORMAL HIGH (ref 3.5–5.1)
Potassium: 5.1 mmol/L (ref 3.5–5.1)
Sodium: 135 mmol/L (ref 135–145)
Sodium: 138 mmol/L (ref 135–145)

## 2024-02-13 LAB — CBC
HCT: 36.6 % — ABNORMAL LOW (ref 39.0–52.0)
HCT: 30.5 % — ABNORMAL LOW (ref 39.0–52.0)
Hemoglobin: 11.2 g/dL — ABNORMAL LOW (ref 13.0–17.0)
Hemoglobin: 9.6 g/dL — ABNORMAL LOW (ref 13.0–17.0)
MCH: 28.6 pg (ref 26.0–34.0)
MCH: 29.2 pg (ref 26.0–34.0)
MCHC: 30.6 g/dL (ref 30.0–36.0)
MCHC: 31.5 g/dL (ref 30.0–36.0)
MCV: 93.6 fL (ref 80.0–100.0)
MCV: 92.7 fL (ref 80.0–100.0)
Platelets: 456 10*3/uL — ABNORMAL HIGH (ref 150–400)
Platelets: 331 10*3/uL (ref 150–400)
RBC: 3.91 MIL/uL — ABNORMAL LOW (ref 4.22–5.81)
RBC: 3.29 MIL/uL — ABNORMAL LOW (ref 4.22–5.81)
RDW: 16.8 % — ABNORMAL HIGH (ref 11.5–15.5)
RDW: 16.9 % — ABNORMAL HIGH (ref 11.5–15.5)
WBC: 29.7 10*3/uL — ABNORMAL HIGH (ref 4.0–10.5)
WBC: 30 10*3/uL — ABNORMAL HIGH (ref 4.0–10.5)
nRBC: 0.1 % (ref 0.0–0.2)
nRBC: 0.1 % (ref 0.0–0.2)

## 2024-02-13 LAB — COOXEMETRY PANEL
Carboxyhemoglobin: 1.1 % (ref 0.5–1.5)
Methemoglobin: <0.7 % (ref 0.0–1.5)
O2 Saturation: 86.5 %
Total hemoglobin: 9.5 g/dL — ABNORMAL LOW (ref 12.0–16.0)

## 2024-02-13 LAB — ECHOCARDIOGRAM COMPLETE
Area-P 1/2: 3.79 cm2
Height: 76 in
S' Lateral: 2.4 cm
Weight: 4197.56 [oz_av]

## 2024-02-13 LAB — BODY FLUID CELL COUNT WITH DIFFERENTIAL
Eos, Fluid: 3 %
Lymphs, Fluid: 6 %
Monocyte-Macrophage-Serous Fluid: 3 % — ABNORMAL LOW (ref 50–90)
Neutrophil Count, Fluid: 88 % — ABNORMAL HIGH (ref 0–25)
Total Nucleated Cell Count, Fluid: 6750 uL — ABNORMAL HIGH (ref 0–1000)

## 2024-02-13 LAB — APTT
aPTT: 46 s — ABNORMAL HIGH (ref 24–36)
aPTT: 42 s — ABNORMAL HIGH (ref 24–36)
aPTT: 44 s — ABNORMAL HIGH (ref 24–36)

## 2024-02-13 LAB — CG4 I-STAT (LACTIC ACID)
Lactic Acid, Venous: 12.4 mmol/L (ref 0.5–1.9)
Lactic Acid, Venous: 13.4 mmol/L (ref 0.5–1.9)
Lactic Acid, Venous: 5.8 mmol/L (ref 0.5–1.9)

## 2024-02-13 LAB — MRSA NEXT GEN BY PCR, NASAL: MRSA by PCR Next Gen: DETECTED — AB

## 2024-02-13 LAB — HEPARIN LEVEL (UNFRACTIONATED)
Heparin Unfractionated: <0.1 [IU]/mL — ABNORMAL LOW (ref 0.30–0.70)
Heparin Unfractionated: >1.1 [IU]/mL — ABNORMAL HIGH (ref 0.30–0.70)

## 2024-02-13 LAB — MAGNESIUM: Magnesium: 2.5 mg/dL — ABNORMAL HIGH (ref 1.7–2.4)

## 2024-02-13 SURGERY — PERICARDIOCENTESIS
Anesthesia: LOCAL

## 2024-02-13 MED ORDER — PRISMASOL BGK 4/2.5 32-4-2.5 MEQ/L EC SOLN
Status: DC
Start: 2024-02-13 — End: 2024-02-16

## 2024-02-13 MED ORDER — PRISMASOL BGK 0/2.5 32-2.5 MEQ/L EC SOLN: Status: DC

## 2024-02-13 MED ORDER — EPINEPHRINE HCL 5 MG/250ML IV SOLN IN NS
0.5000 ug/min | INTRAVENOUS | Status: DC
  Administered 2024-02-13: 0.5 ug/min via INTRAVENOUS
  Filled 2024-02-13: qty 250

## 2024-02-13 MED ORDER — VANCOMYCIN 50 MG/ML ORAL SOLUTION
125.0000 mg | Freq: Every day | ORAL | Status: DC
  Administered 2024-02-14 – 2024-02-16 (×3): 125 mg via ORAL
  Filled 2024-02-13 (×4): qty 2.5

## 2024-02-13 MED ORDER — OXYCODONE-ACETAMINOPHEN 5-325 MG PO TABS: 1.0000 | ORAL_TABLET | Freq: Four times a day (QID) | ORAL | Status: DC | PRN

## 2024-02-13 MED ORDER — SODIUM BICARBONATE 8.4 % IV SOLN
INTRAVENOUS | Status: AC
Start: 2024-02-13 — End: ?
  Filled 2024-02-13: qty 50

## 2024-02-13 MED ORDER — THIAMINE MONONITRATE 100 MG PO TABS
100.0000 mg | ORAL_TABLET | Freq: Every day | ORAL | Status: DC
  Administered 2024-02-13 – 2024-02-17 (×5): 100 mg
  Filled 2024-02-13 (×5): qty 1

## 2024-02-13 MED ORDER — SODIUM BICARBONATE 8.4 % IV SOLN
INTRAVENOUS | Status: DC | PRN
  Administered 2024-02-13 (×3): 50 meq via INTRAVENOUS

## 2024-02-13 MED ORDER — RENA-VITE PO TABS
1.0000 | ORAL_TABLET | Freq: Every day | ORAL | Status: DC
  Administered 2024-02-13 – 2024-02-20 (×8): 1 via ORAL
  Filled 2024-02-13 (×8): qty 1

## 2024-02-13 MED ORDER — SODIUM BICARBONATE 8.4 % IV SOLN
100.0000 meq | Freq: Once | INTRAVENOUS | Status: AC
  Administered 2024-02-13: 100 meq via INTRAVENOUS

## 2024-02-13 MED ORDER — SODIUM CHLORIDE 0.9 % IV SOLN
2.0000 g | Freq: Two times a day (BID) | INTRAVENOUS | Status: DC
  Administered 2024-02-13 – 2024-02-16 (×7): 2 g via INTRAVENOUS
  Filled 2024-02-13 (×7): qty 12.5

## 2024-02-13 MED ORDER — SODIUM CHLORIDE 0.9 % IV SOLN: INTRAVENOUS | Status: DC

## 2024-02-13 MED ORDER — INSULIN ASPART 100 UNIT/ML IJ SOLN
0.0000 [IU] | INTRAMUSCULAR | Status: DC
  Administered 2024-02-13 (×2): 1 [IU] via SUBCUTANEOUS
  Administered 2024-02-14 (×2): 2 [IU] via SUBCUTANEOUS
  Administered 2024-02-14 (×2): 1 [IU] via SUBCUTANEOUS
  Administered 2024-02-15: 2 [IU] via SUBCUTANEOUS
  Administered 2024-02-15: 1 [IU] via SUBCUTANEOUS
  Administered 2024-02-15 – 2024-02-16 (×4): 2 [IU] via SUBCUTANEOUS
  Administered 2024-02-16 – 2024-02-18 (×7): 1 [IU] via SUBCUTANEOUS
  Administered 2024-02-18: 2 [IU] via SUBCUTANEOUS

## 2024-02-13 MED ORDER — MUPIROCIN 2 % EX OINT
1.0000 | TOPICAL_OINTMENT | Freq: Two times a day (BID) | CUTANEOUS | Status: AC
  Administered 2024-02-13 – 2024-02-18 (×9): 1 via NASAL
  Filled 2024-02-13: qty 22

## 2024-02-13 MED ORDER — CHLORHEXIDINE GLUCONATE CLOTH 2 % EX PADS
6.0000 | MEDICATED_PAD | Freq: Every day | CUTANEOUS | Status: AC
Start: 2024-02-13 — End: 2024-02-17
  Administered 2024-02-13 – 2024-02-16 (×4): 6 via TOPICAL

## 2024-02-13 MED ORDER — OXYCODONE-ACETAMINOPHEN 5-325 MG PO TABS
1.0000 | ORAL_TABLET | Freq: Three times a day (TID) | ORAL | Status: DC | PRN
  Administered 2024-02-13: 1 via ORAL
  Filled 2024-02-13: qty 1

## 2024-02-13 MED ORDER — LIDOCAINE HCL (PF) 1 % IJ SOLN
INTRAMUSCULAR | Status: AC
Start: 2024-02-13 — End: ?
  Filled 2024-02-13: qty 30

## 2024-02-13 MED ORDER — CALCIUM CHLORIDE 10 % IV SOLN
INTRAVENOUS | Status: AC
  Filled 2024-02-13: qty 10

## 2024-02-13 MED ORDER — CALCIUM CHLORIDE 10 % IV SOLN
INTRAVENOUS | Status: DC | PRN
  Administered 2024-02-13: 1 g via INTRAVENOUS

## 2024-02-13 MED ORDER — PRISMASOL BGK 2/3.5 32-2-3.5 MEQ/L EC SOLN: Status: DC

## 2024-02-13 MED ORDER — OXYCODONE-ACETAMINOPHEN 5-325 MG PO TABS
2.0000 | ORAL_TABLET | Freq: Four times a day (QID) | ORAL | Status: DC | PRN
  Filled 2024-02-13: qty 2

## 2024-02-13 MED ORDER — SODIUM BICARBONATE 8.4 % IV SOLN
INTRAVENOUS | Status: AC
  Filled 2024-02-13: qty 100

## 2024-02-13 MED ORDER — LIDOCAINE HCL (PF) 1 % IJ SOLN
INTRAMUSCULAR | Status: DC | PRN
  Administered 2024-02-13: 5 mL

## 2024-02-13 SURGICAL SUPPLY — 12 items
CABLE ADAPT PACING TEMP 12FT (ADAPTER) IMPLANT
ELECT DEFIB PAD ADLT CADENCE (PAD) IMPLANT
KIT MICROPUNCTURE NIT STIFF (SHEATH) IMPLANT
MAT PREVALON FULL STRYKER (MISCELLANEOUS) IMPLANT
SHEATH PINNACLE 6F 10CM (SHEATH) IMPLANT
SHEATH PROBE COVER 6X72 (BAG) IMPLANT
SHIELD CATHGARD ARROW (MISCELLANEOUS) IMPLANT
TRAY PERICARDIOCENTESIS 6FX60 (TRAY / TRAY PROCEDURE) IMPLANT
WIRE EMERALD 3MM-J .035X150CM (WIRE) IMPLANT
WIRE MICRO SET 5FR 12 (WIRE) IMPLANT
WIRE MICROINTRODUCER 60CM (WIRE) IMPLANT
WIRE PACING TEMP ST TIP 5 (CATHETERS) IMPLANT

## 2024-02-13 NOTE — H&P (View-Only) (Signed)
 Patient ID: [name], male   DOB: [birth year], 68 y.o.   MRN: [id number]     Advanced Heart Failure Rounding Note  Cardiologist: Knox Perl, MD  Chief Complaint: Shock Subjective:    Steadily increasing pressor requirement overnight, now on epinephrine  1.5, NE 40, vasopressin 0.04.  No further bradycardia, remains in AF with mild RVR 100s-110s.  Have not been able to actively pull fluid via CVVH due to hypotension.  CVP 11-12 today.   WBCs up to 30, afebrile.  Now on empiric vancomycin  + Zosyn , also on po vancomycin  for C difficile treatment.  Not having diarrhea now.   Awake/alert but confused this morning.   Echo this morning reviewed at bedside, large pericardial effusion/appears to have tamponade physiology.    Objective:   Weight Range: 119 kg Body mass index is 31.93 kg/m.   Vital Signs:   Temp:  [97.4 F (36.3 C)-98.7 F (37.1 C)] 97.9 F (36.6 C) (06/05 0700) Pulse Rate:  [50-129] 109 (06/05 0700) Resp:  [6-33] 28 (06/05 0700) BP: (77-144)/(43-128) 88/73 (06/04 1420) SpO2:  [81 %-100 %] 99 % (06/05 0700) Arterial Line BP: (77-123)/(34-55) 92/45 (06/05 0700) Weight:  [782 kg] 119 kg (06/05 0500)    Weight change: Filed Weights   02/11/24 1517 02/13/24 0500  Weight: 113.4 kg 119 kg    Intake/Output:   Intake/Output Summary (Last 24 hours) at 02/13/2024 0755 Last data filed at 02/13/2024 0700 Gross per 24 hour  Intake 2242.39 ml  Output 2059 ml  Net 183.39 ml      Physical Exam    General:  Chronically ill-appearing HEENT: Normal Neck: Supple. JVP 10 cm. Carotids 2+ bilat; no bruits. No lymphadenopathy or thyromegaly appreciated. Cor: PMI nondisplaced. Irregular rate & rhythm. No rubs, gallops or murmurs. Lungs: Clear Abdomen: Soft, nontender, nondistended. No hepatosplenomegaly. No bruits or masses. Good bowel sounds. Extremities: Bilateral BKAs Neuro: Awake but confused.    Telemetry   Tachy, irregular 100s-110s atrial fibrillation (personally  reviewed)   Labs    CBC Recent Labs    02/11/24 1445 02/12/24 0522 02/12/24 1638 02/12/24 1651 02/13/24 0518  WBC 13.0*   < > 27.8*  --  29.7*  NEUTROABS 10.6*  --   --   --   --   HGB 10.5*   < > 11.1* 11.6* 11.2*  HCT 34.2*   < > 34.7* 34.0* 36.6*  MCV 94.0   < > 91.6  --  93.6  PLT 278   < > 373  --  456*   < > = values in this interval not displayed.   Basic Metabolic Panel Recent Labs    95/62/13 0522 02/12/24 1638 02/12/24 1651 02/13/24 0518  NA 133* 134* 130* 135  K 5.8* 5.7* 5.6* 5.4*  CL 90* 93*  --  97*  CO2 23 18*  --  19*  GLUCOSE 183* 201*  --  187*  BUN 52* 61*  --  45*  CREATININE 7.44* 7.85*  --  5.23*  CALCIUM  8.5* 8.8*  --  9.1  MG 2.2 2.1  --  2.5*  PHOS 8.8*  --   --  6.6*   Liver Function Tests Recent Labs    02/11/24 1445 02/13/24 0518  AST 22  --   ALT 17  --   ALKPHOS 82  --   BILITOT 0.8  --   PROT 5.6*  --   ALBUMIN  2.6* 3.4*   No results for input(s): "LIPASE", "AMYLASE" in the  last 72 hours. Cardiac Enzymes No results for input(s): "CKTOTAL", "CKMB", "CKMBINDEX", "TROPONINI" in the last 72 hours.  BNP: BNP (last 3 results) Recent Labs    02/12/24 1638  BNP 387.9*    ProBNP (last 3 results) No results for input(s): "PROBNP" in the last 8760 hours.   D-Dimer No results for input(s): "DDIMER" in the last 72 hours. Hemoglobin A1C No results for input(s): "HGBA1C" in the last 72 hours. Fasting Lipid Panel No results for input(s): "CHOL", "HDL", "LDLCALC", "TRIG", "CHOLHDL", "LDLDIRECT" in the last 72 hours. Thyroid  Function Tests No results for input(s): "TSH", "T4TOTAL", "T3FREE", "THYROIDAB" in the last 72 hours.  Invalid input(s): "FREET3"  Other results:   Imaging    DG Abd 1 View Result Date: 02/12/2024 CLINICAL DATA:  Nasogastric tube placement. EXAM: ABDOMEN - 1 VIEW COMPARISON:  None Available. FINDINGS: Nasogastric tube terminates in the gastric antrum. Defibrillator pads overlie the lower left chest  and left upper quadrant. IMPRESSION: Nasogastric tube terminates in the gastric antrum. Electronically Signed   By: Shearon Denis M.D.   On: 02/12/2024 16:46   DG CHEST PORT 1 VIEW Result Date: 02/12/2024 CLINICAL DATA:  Central line placement. EXAM: PORTABLE CHEST 1 VIEW COMPARISON:  02/11/2024 and CT chest 07/18/2023. FINDINGS: Left IJ central line is in the region of the brachiocephalic vein junction and may be directed posteriorly, within the azygous vein. No pneumothorax. Nasogastric tube is followed into the stomach with the tip projecting beyond the inferior margin of the image. A defibrillator pad overlies the lower left chest. Heart is enlarged. Thoracic aorta is calcified. Mild interstitial prominence and indistinctness. Left lower lobe collapse/consolidation. Probable left pleural effusion. IMPRESSION: 1. Left IJ central line appears to be directed posteriorly, suggesting azygous vein placement. No pneumothorax. 2. Left lower lobe collapse/consolidation. 3. Left pleural effusion. 4. Pulmonary edema. Electronically Signed   By: Shearon Denis M.D.   On: 02/12/2024 16:46     Medications:     Scheduled Medications:  arformoterol  15 mcg Nebulization BID   atorvastatin   80 mg Oral QHS   Chlorhexidine  Gluconate Cloth  6 each Topical Q0600   clopidogrel   75 mg Oral Q breakfast   famotidine   20 mg Oral QODAY   insulin  aspart  0-5 Units Subcutaneous QHS   insulin  aspart  0-6 Units Subcutaneous TID WC   multivitamin  1 tablet Oral Once per day on Tuesday Thursday Saturday   revefenacin  175 mcg Nebulization Daily   rOPINIRole   2 mg Oral QHS   sevelamer  carbonate  1,600 mg Oral TID WC   vancomycin   125 mg Oral QID    Infusions:  DOPamine 5 mcg/kg/min (02/13/24 0700)   epinephrine  1.5 mcg/min (02/13/24 0700)   heparin  1,400 Units/hr (02/13/24 0700)   norepinephrine (LEVOPHED) Adult infusion 40 mcg/min (02/13/24 0700)   piperacillin -tazobactam Stopped (02/13/24 0634)   PrismaSol BGK  2/3.5     prismasol BGK 4/2.5 400 mL/hr at 02/12/24 1751   prismasol BGK 4/2.5 400 mL/hr at 02/13/24 0600   vancomycin      vasopressin 0.04 Units/min (02/13/24 0700)    PRN Medications: acetaminophen , atropine , heparin , ipratropium-albuterol , melatonin, mouth rinse, polyethylene glycol, prochlorperazine     Assessment/Plan   1. Pericardial effusion: Echo reviewed at bedside this morning.  There is a large pericardial effusion that appears to have tamponade physiology. He is in atrial fibrillation with RVR in 110s, steadily increasing pressor requirement.  CVP 11-12.  - I think he will need pericardiocentesis.  Will need to  hold heparin  gtt.  Discussed risks/benefits with patient's son who gives permission for procedure.  2. Bradycardia: Patient was on Coreg  and amiodarone  at home.  Noted to have long pauses up to 6 seconds earlier yesterday.  Initially started on dopamine, now in AF with HR 110s.  Dopamine stopped due to tachycardia, no further bradycardia so far. ?bradycardia related to his large pericardial effusion.  - Off nodal blockers.  - Will place TTVP to enable us  to safely control his AF/RVR.   - May need PPM though ESRD and recent infection make this higher risk.  3. Shock: Hypotensive with HD prior to admission, hypotensive yesterday associated with bradycardia and long pauses.  Echo in 3/25 showed EF 60-65%, mild LVH, mild RVE with normal systolic function, mild AS.  Initially worried that septic shock was main issue here, but echo done this morning shows large pericardial effusion with concern for tamponade. Currently on epinephrine  1.5, NE 40, vasopressin 0.04.  - Needs pericardiocentesis this morning as above.  - Wean pressors post-tap.  4. ID: WBCs up 16 => 30, CXR with possible LLL PNA.  Afebrile.   - Broad spectrum coverage with vancomycin /Zosyn .  5. C difficile diarrhea: Diarrhea resolved.  Has been completing course of po vancomycin .  6. ESRD: Ongoing CVVH with CVP  11-12.  Unable to pull fluid currently due to hypotension.  7. Atrial fibrillation: Currently in AF with rate in 110s.  - No nodal blockers with recent bradycardia.  - Has been on heparin  gtt, hold for pericardiocentesis.  - Will use amiodarone  gtt when we have TTVP.  8. CAD: NSTEMI 3/25 s/p PCI/DES LCX, PTCA/shockwave lithotripsy p RCA. No chest pain.  - Continue Plavix .  Has not been on ASA with anticoagulation.  - Continue atorvastatin .   CRITICAL CARE Performed by: Peder Bourdon  Total critical care time: 45 minutes  Critical care time was exclusive of separately billable procedures and treating other patients.  Critical care was necessary to treat or prevent imminent or life-threatening deterioration.  Critical care was time spent personally by me on the following activities: development of treatment plan with patient and/or surrogate as well as nursing, discussions with consultants, evaluation of patient's response to treatment, examination of patient, obtaining history from patient or surrogate, ordering and performing treatments and interventions, ordering and review of laboratory studies, ordering and review of radiographic studies, pulse oximetry and re-evaluation of patient's condition.   Length of Stay: 2  Peder Bourdon, MD  02/13/2024, 7:55 AM  Advanced Heart Failure Team Pager [phone] (M-F; 7a - 5p)  Please contact [facility] for night-coverage after hours (5p -7a ) and weekends on [email or web address]

## 2024-02-13 NOTE — Consult Note (Signed)
 NAME:  [name], MRN:  [id number], DOB:  03-Sep-1956, LOS: 2 ADMISSION DATE:  02/11/2024, CONSULTATION DATE:  02/12/2024 REFERRING MD:  Arbie Knock CHIEF COMPLAINT:  hypotension   History of Present Illness:  68 year old male with PMH of COPD, insulin -dependent T2DM, GERD, CAD, PAD, HLD, HTN, OSA, ESRD on HD TTS, recent admission for Cdiff colitis who presented to the emergency department on 6/3 weakness and hypotension, decreased PO intake. He was reportedly at dialysis when he became hypotensive after 1L removed to 80s systolic. In ED, Na 133, Cl 93, BUN 41/sCr 6.26, AG 16, WBC 13.0, hgb 10.5, trop 44, lactic 2.4. In ED he received 1L IVF, midodrine  with improvement in his BP. He was admitted to hospitalist. Hospitalist gave albumin  25g x2.   Pertinent  Medical History  COPD, insulin -dependent T2DM, GERD, CAD, PAD, HLD, HTN, OSA, ESRD on HD TTS, recent admission for Cdiff colitis   Significant Hospital Events: Including procedures, antibiotic start and stop dates in addition to other pertinent events   6/3 admitted after presenting for hypotension 6/4 rapid response called for bradycardic episodes and hypotension. PCCM consulted.   Interim History / Subjective:  Patient is in A-fib with RVR heart rate ranging between 110-120 Increasing vasopressor requirement, currently on Levophed, vasopressin and epinephrine , dopamine was titrated off Turned off CRRT due to worsening hypotension Afebrile   Objective    Blood pressure (!) 88/73, pulse (!) 109, temperature 97.9 F (36.6 C), temperature source Axillary, resp. rate (!) 28, height 6\' 4"  (1.93 m), weight 119 kg, SpO2 99%. CVP:  [0 mmHg-31 mmHg] 13 mmHg      Intake/Output Summary (Last 24 hours) at 02/13/2024 4098 Last data filed at 02/13/2024 0700 Gross per 24 hour  Intake 2242.39 ml  Output 2059 ml  Net 183.39 ml   Filed Weights   02/11/24 1517 02/13/24 0500  Weight: 113.4 kg 119 kg    Examination: General: Acute on chronically  ill-appearing elderly male, lying on the bed HEENT: [place]/AT, eyes anicteric.  moist mucus membranes Neuro: Alert, awake following commands Chest: Reduced air entry on right lower lung zone Heart: Irregularly irregular, tachycardic Abdomen: Soft, nontender, nondistended, bowel sounds present Extremities: Bilateral AKA  Labs and images reviewed  Resolved Hospital Problem list     Assessment & Plan:  Combined septic due to right lower lobe pneumonia/cardiogenic shock, due to large pericardial effusion with cardiac tamponade Lactic acidosis/acute metabolic acidosis Patient progressively got more hypotensive Now on 3 vasopressor support with Levophed at 40 mics, vasopressin at 0.04 and epinephrine  at 5 Dopamine was titrated off X-ray chest is suggestive of right lower lobe pneumonia Sputum culture is pending Blood cultures were sent Continue broad-spectrum antibiotics with vancomycin  and cefepime  Follow-up MRSA nasal screen Echocardiogram showing large pericardial effusion with early tamponade physiology Patient is going for pericardiocentesis with possible pericardial drain Lactate has trended up to 12 Will give him 2 A of bicarbonate  Symptomatic bradycardia in the setting of AV nodal blocking agents with beta-blocker and amiodarone  Coronary Artery Disease s/p stent to ostial/proximal RCA and mid RCA, PCI proximal RCA 11/2023 Paroxysmal Atrial Fibrillation with RVR Patient had multiple pauses At home he is on Coreg  and amiodarone  AV nodal blocking agents are on hold Bradycardia and pauses has resolved Continue telemetry monitoring Now he is in A-fib with RVR Dopamine is off Continue Plavix  Anticoagulation are on hold for pericardiocentesis, at home he is on Eliquis  Continue statin  Recent C. Diff Colitis Patient was diagnosed with C. difficile  colitis on his last admission, he was on vancomycin  which was supposed to be completed on 6/4. Trying to find out if patient received  therapeutic treatment and completed it, if he did then we will place him on prophylactic dose  Acute respiratory failure with hypoxia Right lower lobe pneumonia Continue nasal cannula oxygen , currently on 5 L ABG showed PaO2 95 Titrate with O2 sat goal 92% On antibiotics  COPD, not in exacerbation Continue brovana and yupelri nebs   ESRD  Hyperkalemia Patient did not tolerate CRRT in the setting of increasing vasopressor requirement CRRT is on hold until he has pericardiocentesis Closely monitor electrolytes  DMII Continue insulin  with CBG goal 140-180  Concern for dysphagia Speech and swallow evaluation pending  PAD Continue statin and Plavix    Best Practice (right click and "Reselect all SmartList Selections" daily)   Diet/type: NPO DVT prophylaxis: Systemic heparin  is on hold for pericardiocentesis Pressure ulcer(s). Clinically undetermined if the pressure ulcer was present on admission, see nursing notes GI prophylaxis: H2B Lines: Central line and Arterial Line Foley:  N/A Code Status:  full code Last date of multidisciplinary goals of care discussion [Discussed with patient, he wishes to be full code]  Labs   CBC: Recent Labs  Lab 02/11/24 1445 02/12/24 0522 02/12/24 1638 02/12/24 1651 02/13/24 0518  WBC 13.0* 16.0* 27.8*  --  29.7*  NEUTROABS 10.6*  --   --   --   --   HGB 10.5* 10.5* 11.1* 11.6* 11.2*  HCT 34.2* 33.6* 34.7* 34.0* 36.6*  MCV 94.0 94.1 91.6  --  93.6  PLT 278 307 373  --  456*    Basic Metabolic Panel: Recent Labs  Lab 02/11/24 1445 02/12/24 0522 02/12/24 1638 02/12/24 1651 02/13/24 0518  NA 133* 133* 134* 130* 135  K 5.0 5.8* 5.7* 5.6* 5.4*  CL 93* 90* 93*  --  97*  CO2 24 23 18*  --  19*  GLUCOSE 240* 183* 201*  --  187*  BUN 41* 52* 61*  --  45*  CREATININE 6.26* 7.44* 7.85*  --  5.23*  CALCIUM  8.0* 8.5* 8.8*  --  9.1  MG  --  2.2 2.1  --  2.5*  PHOS  --  8.8*  --   --  6.6*   GFR: Estimated Creatinine Clearance:  18.8 mL/min (A) (by C-G formula based on SCr of 5.23 mg/dL (H)). Recent Labs  Lab 02/11/24 1445 02/11/24 1452 02/11/24 1745 02/12/24 0123 02/12/24 0522 02/12/24 1343 02/12/24 1638 02/12/24 1709 02/13/24 0518  WBC 13.0*  --   --   --  16.0*  --  27.8*  --  29.7*  LATICACIDVEN  --    < > 2.5* 2.6*  --  5.5*  --  2.4*  --    < > = values in this interval not displayed.    Liver Function Tests: Recent Labs  Lab 02/11/24 1445 02/13/24 0518  AST 22  --   ALT 17  --   ALKPHOS 82  --   BILITOT 0.8  --   PROT 5.6*  --   ALBUMIN  2.6* 3.4*   No results for input(s): "LIPASE", "AMYLASE" in the last 168 hours. No results for input(s): "AMMONIA" in the last 168 hours.  ABG    Component Value Date/Time   PHART 7.387 02/12/2024 1651   PCO2ART 36.4 02/12/2024 1651   PO2ART 71 (L) 02/12/2024 1651   HCO3 21.9 02/12/2024 1651   TCO2 23 02/12/2024 1651  ACIDBASEDEF 3.0 (H) 02/12/2024 1651   O2SAT 94 02/12/2024 1651     Coagulation Profile: Recent Labs  Lab 02/11/24 1445  INR 2.6*    Cardiac Enzymes: No results for input(s): "CKTOTAL", "CKMB", "CKMBINDEX", "TROPONINI" in the last 168 hours.  HbA1C: Hgb A1c MFr Bld  Date/Time Value Ref Range Status  02/01/2024 11:19 AM 5.4 4.8 - 5.6 % Final    Comment:    (NOTE) Pre diabetes:          5.7%-6.4%  Diabetes:              >6.4%  Glycemic control for   <7.0% adults with diabetes   07/19/2023 05:37 AM 5.7 (H) 4.8 - 5.6 % Final    Comment:    (NOTE) Pre diabetes:          5.7%-6.4%  Diabetes:              >6.4%  Glycemic control for   <7.0% adults with diabetes     CBG: Recent Labs  Lab 02/12/24 1225 02/12/24 1319 02/12/24 1653 02/12/24 2119 02/13/24 0616  GLUCAP 150* 151* 194* 200* 145*   The patient is critically ill due to combined septic and cardiogenic shock critical care was necessary to treat or prevent imminent or life-threatening deterioration.  Critical care was time spent personally by me on the  following activities: development of treatment plan with patient and/or surrogate as well as nursing, discussions with consultants, evaluation of patient's response to treatment, examination of patient, obtaining history from patient or surrogate, ordering and performing treatments and interventions, ordering and review of laboratory studies, ordering and review of radiographic studies, pulse oximetry, re-evaluation of patient's condition and participation in multidisciplinary rounds.   During this encounter critical care time was devoted to patient care services described in this note for 46 minutes.     Trevor Fudge, MD [facility] See Amion for pager If no response to pager, please call [phone] until 7pm After 7pm, Please call E-link [phone]

## 2024-02-13 NOTE — Progress Notes (Signed)
  Echocardiogram 2D Echocardiogram has been performed for cardiac tamponade with doctor present in cath lab.   [name], RDCS 02/13/2024, 11:00 AM

## 2024-02-13 NOTE — Progress Notes (Signed)
 Patient was taken to cath lab, he became hypotensive, with increasing vasopressors requirement. I was called to cath lab to assist with hemodynamic management.  Patient's BP was 70/40, with MAP in high 40's.  He was on 5 mics of epinephrine , 35 mics of Levophed and vasopressin at 0.04.  I increased Levophed to 40 mics, patient remained hypotensive, epinephrine  was slowly increased up to 20 mics.  He was given 3 A of bicarbonate and 1 g of calcium  without much improvement in blood pressure  His norepinephrine was further increased to 60 mics slowly, still patient's blood pressure remained 70/40.  As soon as he had pericardial drain in place, with the drainage of pericardial fluid his blood pressure started improving, slowly vasopressors were titrated down, patient started feeling better  Total 1280 cc dark bloody fluid was drained and pericardial drain kept in.   Additional Critical care time spent 37 min    Trevor Fudge, MD

## 2024-02-13 NOTE — Progress Notes (Addendum)
 Patient ID: [name], male   DOB: [birth year], 68 y.o.   MRN: [id number]     Advanced Heart Failure Rounding Note  Cardiologist: Knox Perl, MD  Chief Complaint: Shock Subjective:    Steadily increasing pressor requirement overnight, now on epinephrine  1.5, NE 40, vasopressin 0.04.  No further bradycardia, remains in AF with mild RVR 100s-110s.  Have not been able to actively pull fluid via CVVH due to hypotension.  CVP 11-12 today.   WBCs up to 30, afebrile.  Now on empiric vancomycin  + Zosyn , also on po vancomycin  for C difficile treatment.  Not having diarrhea now.   Awake/alert but confused this morning.   Echo this morning reviewed at bedside, large pericardial effusion/appears to have tamponade physiology.    Objective:   Weight Range: 119 kg Body mass index is 31.93 kg/m.   Vital Signs:   Temp:  [97.4 F (36.3 C)-98.7 F (37.1 C)] 97.9 F (36.6 C) (06/05 0700) Pulse Rate:  [50-129] 109 (06/05 0700) Resp:  [6-33] 28 (06/05 0700) BP: (77-144)/(43-128) 88/73 (06/04 1420) SpO2:  [81 %-100 %] 99 % (06/05 0700) Arterial Line BP: (77-123)/(34-55) 92/45 (06/05 0700) Weight:  [782 kg] 119 kg (06/05 0500)    Weight change: Filed Weights   02/11/24 1517 02/13/24 0500  Weight: 113.4 kg 119 kg    Intake/Output:   Intake/Output Summary (Last 24 hours) at 02/13/2024 0755 Last data filed at 02/13/2024 0700 Gross per 24 hour  Intake 2242.39 ml  Output 2059 ml  Net 183.39 ml      Physical Exam    General:  Chronically ill-appearing HEENT: Normal Neck: Supple. JVP 10 cm. Carotids 2+ bilat; no bruits. No lymphadenopathy or thyromegaly appreciated. Cor: PMI nondisplaced. Irregular rate & rhythm. No rubs, gallops or murmurs. Lungs: Clear Abdomen: Soft, nontender, nondistended. No hepatosplenomegaly. No bruits or masses. Good bowel sounds. Extremities: Bilateral BKAs Neuro: Awake but confused.    Telemetry   Tachy, irregular 100s-110s atrial fibrillation (personally  reviewed)   Labs    CBC Recent Labs    02/11/24 1445 02/12/24 0522 02/12/24 1638 02/12/24 1651 02/13/24 0518  WBC 13.0*   < > 27.8*  --  29.7*  NEUTROABS 10.6*  --   --   --   --   HGB 10.5*   < > 11.1* 11.6* 11.2*  HCT 34.2*   < > 34.7* 34.0* 36.6*  MCV 94.0   < > 91.6  --  93.6  PLT 278   < > 373  --  456*   < > = values in this interval not displayed.   Basic Metabolic Panel Recent Labs    95/62/13 0522 02/12/24 1638 02/12/24 1651 02/13/24 0518  NA 133* 134* 130* 135  K 5.8* 5.7* 5.6* 5.4*  CL 90* 93*  --  97*  CO2 23 18*  --  19*  GLUCOSE 183* 201*  --  187*  BUN 52* 61*  --  45*  CREATININE 7.44* 7.85*  --  5.23*  CALCIUM  8.5* 8.8*  --  9.1  MG 2.2 2.1  --  2.5*  PHOS 8.8*  --   --  6.6*   Liver Function Tests Recent Labs    02/11/24 1445 02/13/24 0518  AST 22  --   ALT 17  --   ALKPHOS 82  --   BILITOT 0.8  --   PROT 5.6*  --   ALBUMIN  2.6* 3.4*   No results for input(s): "LIPASE", "AMYLASE" in the  last 72 hours. Cardiac Enzymes No results for input(s): "CKTOTAL", "CKMB", "CKMBINDEX", "TROPONINI" in the last 72 hours.  BNP: BNP (last 3 results) Recent Labs    02/12/24 1638  BNP 387.9*    ProBNP (last 3 results) No results for input(s): "PROBNP" in the last 8760 hours.   D-Dimer No results for input(s): "DDIMER" in the last 72 hours. Hemoglobin A1C No results for input(s): "HGBA1C" in the last 72 hours. Fasting Lipid Panel No results for input(s): "CHOL", "HDL", "LDLCALC", "TRIG", "CHOLHDL", "LDLDIRECT" in the last 72 hours. Thyroid  Function Tests No results for input(s): "TSH", "T4TOTAL", "T3FREE", "THYROIDAB" in the last 72 hours.  Invalid input(s): "FREET3"  Other results:   Imaging    DG Abd 1 View Result Date: 02/12/2024 CLINICAL DATA:  Nasogastric tube placement. EXAM: ABDOMEN - 1 VIEW COMPARISON:  None Available. FINDINGS: Nasogastric tube terminates in the gastric antrum. Defibrillator pads overlie the lower left chest  and left upper quadrant. IMPRESSION: Nasogastric tube terminates in the gastric antrum. Electronically Signed   By: Shearon Denis M.D.   On: 02/12/2024 16:46   DG CHEST PORT 1 VIEW Result Date: 02/12/2024 CLINICAL DATA:  Central line placement. EXAM: PORTABLE CHEST 1 VIEW COMPARISON:  02/11/2024 and CT chest 07/18/2023. FINDINGS: Left IJ central line is in the region of the brachiocephalic vein junction and may be directed posteriorly, within the azygous vein. No pneumothorax. Nasogastric tube is followed into the stomach with the tip projecting beyond the inferior margin of the image. A defibrillator pad overlies the lower left chest. Heart is enlarged. Thoracic aorta is calcified. Mild interstitial prominence and indistinctness. Left lower lobe collapse/consolidation. Probable left pleural effusion. IMPRESSION: 1. Left IJ central line appears to be directed posteriorly, suggesting azygous vein placement. No pneumothorax. 2. Left lower lobe collapse/consolidation. 3. Left pleural effusion. 4. Pulmonary edema. Electronically Signed   By: Shearon Denis M.D.   On: 02/12/2024 16:46     Medications:     Scheduled Medications:  arformoterol  15 mcg Nebulization BID   atorvastatin   80 mg Oral QHS   Chlorhexidine  Gluconate Cloth  6 each Topical Q0600   clopidogrel   75 mg Oral Q breakfast   famotidine   20 mg Oral QODAY   insulin  aspart  0-5 Units Subcutaneous QHS   insulin  aspart  0-6 Units Subcutaneous TID WC   multivitamin  1 tablet Oral Once per day on Tuesday Thursday Saturday   revefenacin  175 mcg Nebulization Daily   rOPINIRole   2 mg Oral QHS   sevelamer  carbonate  1,600 mg Oral TID WC   vancomycin   125 mg Oral QID    Infusions:  DOPamine 5 mcg/kg/min (02/13/24 0700)   epinephrine  1.5 mcg/min (02/13/24 0700)   heparin  1,400 Units/hr (02/13/24 0700)   norepinephrine (LEVOPHED) Adult infusion 40 mcg/min (02/13/24 0700)   piperacillin -tazobactam Stopped (02/13/24 0634)   PrismaSol BGK  2/3.5     prismasol BGK 4/2.5 400 mL/hr at 02/12/24 1751   prismasol BGK 4/2.5 400 mL/hr at 02/13/24 0600   vancomycin      vasopressin 0.04 Units/min (02/13/24 0700)    PRN Medications: acetaminophen , atropine , heparin , ipratropium-albuterol , melatonin, mouth rinse, polyethylene glycol, prochlorperazine     Assessment/Plan   1. Pericardial effusion: Echo reviewed at bedside this morning.  There is a large pericardial effusion that appears to have tamponade physiology. He is in atrial fibrillation with RVR in 110s, steadily increasing pressor requirement.  CVP 11-12.  - I think he will need pericardiocentesis.  Will need to  hold heparin  gtt.  Discussed risks/benefits with patient's son who gives permission for procedure.  2. Bradycardia: Patient was on Coreg  and amiodarone  at home.  Noted to have long pauses up to 6 seconds earlier yesterday.  Initially started on dopamine, now in AF with HR 110s.  Dopamine stopped due to tachycardia, no further bradycardia so far. ?bradycardia related to his large pericardial effusion.  - Off nodal blockers.  - Will place TTVP to enable us  to safely control his AF/RVR.   - May need PPM though ESRD and recent infection make this higher risk.  3. Shock: Hypotensive with HD prior to admission, hypotensive yesterday associated with bradycardia and long pauses.  Echo in 3/25 showed EF 60-65%, mild LVH, mild RVE with normal systolic function, mild AS.  Initially worried that septic shock was main issue here, but echo done this morning shows large pericardial effusion with concern for tamponade. Currently on epinephrine  1.5, NE 40, vasopressin 0.04.  - Needs pericardiocentesis this morning as above.  - Wean pressors post-tap.  4. ID: WBCs up 16 => 30, CXR with possible LLL PNA.  Afebrile.   - Broad spectrum coverage with vancomycin /Zosyn .  5. C difficile diarrhea: Diarrhea resolved.  Has been completing course of po vancomycin .  6. ESRD: Ongoing CVVH with CVP  11-12.  Unable to pull fluid currently due to hypotension.  7. Atrial fibrillation: Currently in AF with rate in 110s.  - No nodal blockers with recent bradycardia.  - Has been on heparin  gtt, hold for pericardiocentesis.  - Will use amiodarone  gtt when we have TTVP.  8. CAD: NSTEMI 3/25 s/p PCI/DES LCX, PTCA/shockwave lithotripsy p RCA. No chest pain.  - Continue Plavix .  Has not been on ASA with anticoagulation.  - Continue atorvastatin .   CRITICAL CARE Performed by: Peder Bourdon  Total critical care time: 45 minutes  Critical care time was exclusive of separately billable procedures and treating other patients.  Critical care was necessary to treat or prevent imminent or life-threatening deterioration.  Critical care was time spent personally by me on the following activities: development of treatment plan with patient and/or surrogate as well as nursing, discussions with consultants, evaluation of patient's response to treatment, examination of patient, obtaining history from patient or surrogate, ordering and performing treatments and interventions, ordering and review of laboratory studies, ordering and review of radiographic studies, pulse oximetry and re-evaluation of patient's condition.   Length of Stay: 2  Peder Bourdon, MD  02/13/2024, 7:55 AM  Advanced Heart Failure Team Pager [phone] (M-F; 7a - 5p)  Please contact [facility] for night-coverage after hours (5p -7a ) and weekends on [email or web address]

## 2024-02-13 NOTE — Interval H&P Note (Signed)
 History and Physical Interval Note:  02/13/2024 8:41 AM  Marcey Server  has presented today for surgery, with the diagnosis of pericardiocentesis.  The various methods of treatment have been discussed with the patient and family. After consideration of risks, benefits and other options for treatment, the patient has consented to  Procedure(s): PERICARDIOCENTESIS (N/A) as a surgical intervention.  The patient's history has been reviewed, patient examined, no change in status, stable for surgery.  I have reviewed the patient's chart and labs.  Questions were answered to the patient's satisfaction.     Zyionna Pesce J Jerrika Ledlow

## 2024-02-13 NOTE — Inpatient Diabetes Management (Signed)
 Inpatient Diabetes Program Recommendations  AACE/ADA: New Consensus Statement on Inpatient Glycemic Control (2015)  Target Ranges:  Prepandial:   less than 140 mg/dL      Peak postprandial:   less than 180 mg/dL (1-2 hours)      Critically ill patients:  140 - 180 mg/dL   Lab Results  Component Value Date   GLUCAP 145 (H) 02/13/2024   HGBA1C 5.4 02/01/2024    Review of Glycemic Control  Latest Reference Range & Units 02/12/24 13:19 02/12/24 16:53 02/12/24 21:19 02/13/24 06:16  Glucose-Capillary 70 - 99 mg/dL 147 (H) 829 (H) 562 (H) 145 (H)   Diabetes history: DM 2 Outpatient Diabetes medications:  Novolog  0-12 units tid with meals  Current orders for Inpatient glycemic control:  Novolog  0-6 units tid with meals and HS  Inpatient Diabetes Program Recommendations:    Consider changing Novolog  (0-6 units) correction to q 4 hours.  Thanks,  Josefa Ni, RN, BC-ADM Inpatient Diabetes Coordinator Pager [phone]  (8a-5p)

## 2024-02-13 NOTE — Progress Notes (Signed)
 Initial Nutrition Assessment  DOCUMENTATION CODES:   Non-severe (moderate) malnutrition in context of chronic illness  INTERVENTION:   Plan for Cortrak tomorrow  Recommend holding Renvela  until taking oral diet  Tube Feeding Recommendations:  Vital 1.5 at 20 ml/hr with goal of 60 ml/hr Pro-Source TF20 60 mL BID TF providing 137 g of protein, 2320 kcals, 1094 mL  Add Thiamine 100 mg daily Add Renal MVI daily  NUTRITION DIAGNOSIS:   Moderate Malnutrition related to chronic illness as evidenced by moderate fat depletion, moderate muscle depletion, edema.   GOAL:   Patient will meet greater than or equal to 90% of their needs  MONITOR:   Labs, Weight trends, Diet advancement, Skin, I & O's  REASON FOR ASSESSMENT:   Consult Assessment of nutrition requirement/status  ASSESSMENT:   68 yo male admitted with shock, combined septic with RLL pneuminia and cardiogenic with large pericardial effusion with cardiac tamponade. +lactic acidosis, metabolic acidosis.  PMH includes COPD, DM, GERd, CAD, PAD, HLD, HTN, OSA, ESRD on HD, recent C.diff colitis. Surgical hx includes bilateral BKA, colectomy with ileostomy (reversed)  6/03 Admitted to 5W 6/04 Rapid response due to hypotension and bradycardia 6/05 Worsening shock, ECHO: pericardial effusion with cardiac tamponade requiring pericardiocentesis with 1280 mL drained, drain left in place  Pt remains encephalopathic, able to get pt to wake up but unable to obtain nutrition history  Worsening shock overnight, ECHO with pericardial effusion with cardiac tamponade. Pericardiocentesis with 1280 mL of dark bloody fluid drained, pericardial drain remains in place. Post procedure, pt improving hemodynamically with decreasing pressor requirements  Currently on CRRT  Lactic Acid trending down, currently 5.8.  Levophed now down to 18, Epinephrine  at 5, Vasopressin at 0.04, Dopamine OFF MAPs: 40s-50s this afternoon Diastolic: as low as  27, 30-40s range  Despite improvement in vasopressor requirements, recommend holding on initiation of trickle TF today given MAPs and Diastolic Pressures  Received partial iHD on 6/03 (1.5 hours), post wt 116.5 kg. EDW 107 kg. Current wt 119 kg  NPO, NG tube with tip in stomach  Pt with bilateral BKA. Noted height of 6'4", unable to clarify if this is accurate pre-amputation height with pt at this time   Noted pt with extensive surgical hx including hx of colectomy requiring ileostomy which has since been reversed.   Noted pt recently discharged on 5/27 to SNF after admission for recurrent C.diff colitis and AMS with electrolyte abnormalities   Labs:  Sodium 138 (L) Potassium 5.1 (wdl) BUN 44 Creatinine 4.89 Phosphorus 6.9 (H)  Meds:  SS novolog  Rena-Vite Renvela  with meals   NUTRITION - FOCUSED PHYSICAL EXAM:  Flowsheet Row Most Recent Value  Orbital Region Moderate depletion  Upper Arm Region Unable to assess  Thoracic and Lumbar Region Unable to assess  Buccal Region Severe depletion  Temple Region Moderate depletion  Clavicle Bone Region Moderate depletion  Clavicle and Acromion Bone Region Moderate depletion  Scapular Bone Region Moderate depletion  Dorsal Hand Severe depletion  Patellar Region Unable to assess  Anterior Thigh Region Unable to assess  Posterior Calf Region --  [bilateral BKA]  Edema (RD Assessment) Moderate  Hair Reviewed  Eyes Unable to assess  Mouth Reviewed  [edentulous, purple spots on tongue]  Skin Reviewed  Nails Reviewed        Diet Order:   Diet Order     None       EDUCATION NEEDS:   Not appropriate for education at this time  Skin:  Skin Assessment:  Skin Integrity Issues: Skin Integrity Issues:: Stage I Stage I: coccyx  Last BM:  PTA  Height:   Ht Readings from Last 1 Encounters:  02/11/24 6\' 4"  (1.93 m)    Weight:   Wt Readings from Last 1 Encounters:  02/13/24 119 kg    BMI:  Body mass index is 31.93  kg/m.  Estimated Nutritional Needs:   Kcal:  2200-2400 kcals  Protein:  120-140 g  Fluid:  1000 mL plus UOP   Norvel Beer MS, RDN, LDN, CNSC Registered Dietitian 3 Clinical Nutrition RD Inpatient Contact Info in [place]

## 2024-02-13 NOTE — Progress Notes (Signed)
 [facility] Progress Note  Subjective:  I/O yest +853 cc This am echo showed pericardial effusion w/ tamponade  Shock get worse --> pt went for pericardiocentesis this am Post-procedure, tachycardia resolved and BP's starting to come up  Vitals:   02/13/24 1021 02/13/24 1026 02/13/24 1031 02/13/24 1036  BP:      Pulse: 92 96 98 (!) 111  Resp: (!) 26 (!) 28 (!) 26 (!) 26  Temp:      TempSrc:      SpO2: 100% 100% 100% 100%  Weight:      Height:        Exam: Gen patient is lethargic in ICU, [place] O2 No jvd or bruits Chest clear bilat to bases RRR no MRG Abd soft ntnd no mass or ascites +bs Ext 1+ bilat pitting LE edema Neuro is as above     LUA AVF+bruit      Renal-related home meds: Renvela  2 AC 3 times daily Others: Eliquis , Lipitor, MVI, Plavix , Pepcid , Requip , Flomax , p.o. vancomycin       OP HD: [facility] 4h  B450   107kg   AVF  Heparin  none Last OP HD 5/31, post wt 112.5kg Partial HD 6/03, post wt 116.5kg (up 9kg), 1.5hr session, sent to ed Hectorol  2 mcg iv three times per week   Assessment/ Plan: Pericardial effusion w/ tamponade: worsening shock overnight, echo today w/ pericardial effusion + tamponade. Went for pericardiocentesis this am. HR has improved and shock may be improving as well post-procedure. Per CCM/ cardiology.  Recent Cdif colitis: continued on po vanc per pmd  ESRD: started CRRT here on 6/04. Cont for now. Usual is HD TTS.  BP: in shock due to #1, on pressors Volume: hx of chronic vol overload, UF 50-75 cc/hr w/ CRRT as tol Anemia of esrd: Hb 10-12 here, follow.   Secondary hyperparathyroidism: CCa in range, phos a bit high. Cont binders if eating.  PAF: on eliquis  CAD: sp PCI w/ DES in march 2025         Rob Shakara Tweedy MD  [facility] 02/13/2024, 12:43 PM  Recent Labs  Lab 02/11/24 1445 02/12/24 0522 02/12/24 1638 02/12/24 1651 02/13/24 0518 02/13/24 0840  HGB 10.5* 10.5* 11.1*   < > 11.2* 11.9*  ALBUMIN  2.6*  --   --    --  3.4*  --   CALCIUM  8.0* 8.5* 8.8*  --  9.1  --   PHOS  --  8.8*  --   --  6.6*  --   CREATININE 6.26* 7.44* 7.85*  --  5.23*  --   K 5.0 5.8* 5.7*   < > 5.4* 5.9*   < > = values in this interval not displayed.   No results for input(s): "IRON", "TIBC", "FERRITIN" in the last 168 hours. Inpatient medications:  arformoterol  15 mcg Nebulization BID   atorvastatin   80 mg Oral QHS   Chlorhexidine  Gluconate Cloth  6 each Topical Q0600   clopidogrel   75 mg Oral Q breakfast   famotidine   20 mg Oral QODAY   insulin  aspart  0-5 Units Subcutaneous QHS   insulin  aspart  0-6 Units Subcutaneous TID WC   multivitamin  1 tablet Oral Once per day on Tuesday Thursday Saturday   revefenacin  175 mcg Nebulization Daily   rOPINIRole   2 mg Oral QHS   sevelamer  carbonate  1,600 mg Oral TID WC   sodium bicarbonate   100 mEq Intravenous Once   vancomycin   125 mg Oral  QID   [START ON 02/14/2024] vancomycin   125 mg Oral Daily    ceFEPime  (MAXIPIME ) IV     DOPamine 5 mcg/kg/min (02/13/24 0700)   epinephrine  5 mcg/min (02/13/24 0957)   heparin  1,600 Units/hr (02/13/24 1241)   norepinephrine (LEVOPHED) Adult infusion 25 mcg/min (02/13/24 1206)   prismasol BGK 0/2.5     prismasol BGK 0/2.5     PrismaSol BGK 2/3.5 1,500 mL/hr at 02/13/24 1200   vancomycin      vasopressin 0.04 Units/min (02/13/24 0700)   acetaminophen , atropine , heparin , ipratropium-albuterol , melatonin, mouth rinse, polyethylene glycol, prochlorperazine 

## 2024-02-13 NOTE — Progress Notes (Addendum)
 PHARMACY - ANTICOAGULATION CONSULT NOTE  Pharmacy Consult for heparin  Indication: atrial fibrillation  Allergies  Allergen Reactions   Contrast Media [Iodinated Contrast Media] Shortness Of Breath and Other (See Comments)    Difficulty breathing and altered mental status     Ivp Dye [Iodinated Contrast Media] Anaphylaxis, Shortness Of Breath and Other (See Comments)    Breathing problems, altered mental state    Latex Rash and Other (See Comments)    A severe rash appears after the first 24 hours of being placed   Tape Other (See Comments) and Rash    Rash after 1 day of use  Do not leave on longer then 3 days with out be changed    Patient Measurements: Height: 6\' 4"  (193 cm) Weight: 119 kg (262 lb 5.6 oz) IBW/kg (Calculated) : 86.8 HEPARIN  DW (KG): 110  Vital Signs: Temp: 97.9 F (36.6 C) (06/05 0700) Temp Source: Axillary (06/05 0700) BP: 138/93 (06/05 0941) Pulse Rate: 111 (06/05 1036)  Labs: Recent Labs    02/11/24 1445 02/11/24 1738 02/12/24 0522 02/12/24 1343 02/12/24 1638 02/12/24 1651 02/13/24 0518 02/13/24 0759 02/13/24 0840  HGB 10.5*  --  10.5*  --  11.1* 11.6* 11.2*  --  11.9*  HCT 34.2*  --  33.6*  --  34.7* 34.0* 36.6*  --  35.0*  PLT 278  --  307  --  373  --  456*  --   --   APTT  --   --   --   --   --   --  46* 42*  --   LABPROT 27.8*  --   --   --   --   --   --   --   --   INR 2.6*  --   --   --   --   --   --   --   --   HEPARINUNFRC  --   --   --   --   --   --  <0.10* >1.10*  --   CREATININE 6.26*  --  7.44*  --  7.85*  --  5.23*  --   --   TROPONINIHS 44* 46*  --  52* 55*  --   --   --   --     Estimated Creatinine Clearance: 18.8 mL/min (A) (by C-G formula based on SCr of 5.23 mg/dL (H)).   Medical History: Past Medical History:  Diagnosis Date   Carotid artery occlusion 11/10/2010   LEFT CAROTID ENDARTERECTOMY   Complication of anesthesia    BP WENT UP AT [facility] "   COPD (chronic obstructive pulmonary disease) (HCC)    pt  denies this dx as of 06/01/20 - no inhaler    Diabetes mellitus without complication (HCC)    Diverticulitis    Diverticulosis of colon (without mention of hemorrhage)    DJD (degenerative joint disease)    knees/hands/feet/back/neck   ESRD (end stage renal disease) on dialysis (HCC)    Fatty liver    Full dentures    GERD (gastroesophageal reflux disease)    H/O hiatal hernia    History of blood transfusion    with a past surical procedure per patient 06/01/20   Hyperlipidemia    Hypertension    Neuromuscular disorder (HCC)    peripheral neuropathy   Non-pressure chronic ulcer of other part of left foot limited to breakdown of skin (HCC) 11/12/2016   Osteomyelitis (HCC)    left 5th  metatarsal   PAD (peripheral artery disease) (HCC)    Distal aortogram June 2012. Atherectomy left popliteal artery July 2012.    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 Pt is on apixaban  PTA for AF, pharmacy to transition to IV heparin , last dose of apixaban  6/4 am.  -aPTT 42 sec, heparin  level > 1.1 (from Eliquis ) on 1400 units/hr -CBC stable -s/p emergent pericardiocentesis 6/5 with 1280 mL hemorrhagic fluid drained, heparin  restarted post procedure  Goal of Therapy:  Heparin  level 0.3-0.7 units/ml aPTT 66-102 seconds Monitor platelets by anticoagulation protocol: Yes   Plan:  Increase heparin  IV to 1600 units/hr 8h aPTT Daily aPTT, heparin  level, CBC  ADDENDUM - now w/ 90cc bloody output from drain.  Hold heparin  per AHF.  Restart pending hemostasis.  Cecillia Cogan, PharmD Clinical Pharmacist 02/13/2024  12:21 PM

## 2024-02-13 NOTE — Progress Notes (Signed)
 eLink Physician-Brief Progress Note Patient Name: [name] DOB: 11/26/1955 MRN: [id number]   Date of Service  02/13/2024  HPI/Events of Note  Patient is n.p.o., CBGs currently AC/at bedtime  Family asking if the patient can resume home fentanyl  patch -50 mcg  eICU Interventions  Switch insulin  and CBGs to every 4 hours  Home fentanyl  patch is not appropriate at this time.  Add sliding scale to the oxycodone    0350 -pulled out his NG tube, replace and obtain KUB  0448 -had significant difficulty passing feeding tube, radiographically in the distal esophagus.  Can remove the tube and attempt coretrak later today  Intervention Category Intermediate Interventions: Hyperglycemia - evaluation and treatment  [name] 02/13/2024, 8:40 PM

## 2024-02-14 ENCOUNTER — Inpatient Hospital Stay (HOSPITAL_COMMUNITY)

## 2024-02-14 DIAGNOSIS — I312 Hemopericardium, not elsewhere classified: Secondary | ICD-10-CM

## 2024-02-14 DIAGNOSIS — J189 Pneumonia, unspecified organism: Secondary | ICD-10-CM

## 2024-02-14 DIAGNOSIS — R6521 Severe sepsis with septic shock: Secondary | ICD-10-CM

## 2024-02-14 DIAGNOSIS — E440 Moderate protein-calorie malnutrition: Secondary | ICD-10-CM | POA: Insufficient documentation

## 2024-02-14 DIAGNOSIS — I509 Heart failure, unspecified: Secondary | ICD-10-CM

## 2024-02-14 LAB — CBC
HCT: 29.2 % — ABNORMAL LOW (ref 39.0–52.0)
HCT: 28.3 % — ABNORMAL LOW (ref 39.0–52.0)
Hemoglobin: 9.2 g/dL — ABNORMAL LOW (ref 13.0–17.0)
Hemoglobin: 8.9 g/dL — ABNORMAL LOW (ref 13.0–17.0)
MCH: 29.2 pg (ref 26.0–34.0)
MCH: 29.3 pg (ref 26.0–34.0)
MCHC: 31.5 g/dL (ref 30.0–36.0)
MCHC: 31.4 g/dL (ref 30.0–36.0)
MCV: 92.7 fL (ref 80.0–100.0)
MCV: 93.1 fL (ref 80.0–100.0)
Platelets: 311 10*3/uL (ref 150–400)
Platelets: 284 10*3/uL (ref 150–400)
RBC: 3.15 MIL/uL — ABNORMAL LOW (ref 4.22–5.81)
RBC: 3.04 MIL/uL — ABNORMAL LOW (ref 4.22–5.81)
RDW: 16.7 % — ABNORMAL HIGH (ref 11.5–15.5)
RDW: 17 % — ABNORMAL HIGH (ref 11.5–15.5)
WBC: 22.3 10*3/uL — ABNORMAL HIGH (ref 4.0–10.5)
WBC: 20.3 10*3/uL — ABNORMAL HIGH (ref 4.0–10.5)
nRBC: 0 % (ref 0.0–0.2)
nRBC: 0 % (ref 0.0–0.2)

## 2024-02-14 LAB — ECHOCARDIOGRAM LIMITED
Est EF: 55
Height: 76 in
S' Lateral: 2.9 cm
Weight: 4035.3 [oz_av]

## 2024-02-14 LAB — COOXEMETRY PANEL
Carboxyhemoglobin: 1.5 % (ref 0.5–1.5)
Methemoglobin: <0.7 % (ref 0.0–1.5)
O2 Saturation: 68.6 %
Total hemoglobin: 9 g/dL — ABNORMAL LOW (ref 12.0–16.0)

## 2024-02-14 LAB — GLUCOSE, CAPILLARY
Glucose-Capillary: 178 mg/dL — ABNORMAL HIGH (ref 70–99)
Glucose-Capillary: 177 mg/dL — ABNORMAL HIGH (ref 70–99)
Glucose-Capillary: 108 mg/dL — ABNORMAL HIGH (ref 70–99)
Glucose-Capillary: 143 mg/dL — ABNORMAL HIGH (ref 70–99)
Glucose-Capillary: 235 mg/dL — ABNORMAL HIGH (ref 70–99)
Glucose-Capillary: 215 mg/dL — ABNORMAL HIGH (ref 70–99)

## 2024-02-14 LAB — RENAL FUNCTION PANEL
Albumin: 2.8 g/dL — ABNORMAL LOW (ref 3.5–5.0)
Albumin: 2.6 g/dL — ABNORMAL LOW (ref 3.5–5.0)
Anion gap: 14 (ref 5–15)
Anion gap: 15 (ref 5–15)
BUN: 34 mg/dL — ABNORMAL HIGH (ref 8–23)
BUN: 29 mg/dL — ABNORMAL HIGH (ref 8–23)
CO2: 24 mmol/L (ref 22–32)
CO2: 22 mmol/L (ref 22–32)
Calcium: 8.6 mg/dL — ABNORMAL LOW (ref 8.9–10.3)
Calcium: 8.7 mg/dL — ABNORMAL LOW (ref 8.9–10.3)
Chloride: 98 mmol/L (ref 98–111)
Chloride: 100 mmol/L (ref 98–111)
Creatinine, Ser: 3.53 mg/dL — ABNORMAL HIGH (ref 0.61–1.24)
Creatinine, Ser: 2.96 mg/dL — ABNORMAL HIGH (ref 0.61–1.24)
GFR, Estimated: 18 mL/min — ABNORMAL LOW (ref >60)
GFR, Estimated: 22 mL/min — ABNORMAL LOW (ref >60)
Glucose, Bld: 181 mg/dL — ABNORMAL HIGH (ref 70–99)
Glucose, Bld: 182 mg/dL — ABNORMAL HIGH (ref 70–99)
Phosphorus: 4.2 mg/dL (ref 2.5–4.6)
Phosphorus: 3.4 mg/dL (ref 2.5–4.6)
Potassium: 4.3 mmol/L (ref 3.5–5.1)
Potassium: 4.2 mmol/L (ref 3.5–5.1)
Sodium: 136 mmol/L (ref 135–145)
Sodium: 137 mmol/L (ref 135–145)

## 2024-02-14 LAB — PROTEIN, BODY FLUID (OTHER): Total Protein, Body Fluid Other: 5 g/dL

## 2024-02-14 LAB — CYTOLOGY - NON PAP

## 2024-02-14 LAB — GLUCOSE, BODY FLUID OTHER: Glucose, Body Fluid Other: 104 mg/dL

## 2024-02-14 LAB — MAGNESIUM: Magnesium: 2.1 mg/dL (ref 1.7–2.4)

## 2024-02-14 LAB — PH, BODY FLUID: pH, Body Fluid: 7.1

## 2024-02-14 LAB — CREATININE, BODY FLUID OTHER: Creatinine, Body Fluid: 5.5 mg/dL

## 2024-02-14 MED ORDER — VITAL 1.5 CAL PO LIQD
1000.0000 mL | ORAL | Status: DC
  Administered 2024-02-14: 1000 mL

## 2024-02-14 MED ORDER — OXYCODONE-ACETAMINOPHEN 5-325 MG PO TABS
1.0000 | ORAL_TABLET | Freq: Four times a day (QID) | ORAL | Status: DC | PRN
  Administered 2024-02-16: 1 via ORAL
  Filled 2024-02-14: qty 1

## 2024-02-14 MED ORDER — PROSOURCE TF20 ENFIT COMPATIBL EN LIQD: 60.0000 mL | Freq: Every day | ENTERAL | Status: DC

## 2024-02-14 MED ORDER — OXYCODONE-ACETAMINOPHEN 5-325 MG PO TABS
2.0000 | ORAL_TABLET | Freq: Four times a day (QID) | ORAL | Status: DC | PRN
  Administered 2024-02-15 – 2024-02-20 (×7): 2 via ORAL
  Filled 2024-02-14 (×7): qty 2

## 2024-02-14 MED ORDER — HYDROMORPHONE HCL 1 MG/ML IJ SOLN
1.0000 mg | INTRAMUSCULAR | Status: DC | PRN
  Administered 2024-02-14 – 2024-02-21 (×8): 1 mg via INTRAVENOUS
  Filled 2024-02-14 (×9): qty 1

## 2024-02-14 MED ORDER — MIDODRINE HCL 5 MG PO TABS
5.0000 mg | ORAL_TABLET | Freq: Three times a day (TID) | ORAL | Status: DC
  Filled 2024-02-14: qty 1

## 2024-02-14 MED ORDER — INSULIN GLARGINE-YFGN 100 UNIT/ML ~~LOC~~ SOLN
5.0000 [IU] | Freq: Every day | SUBCUTANEOUS | Status: DC
  Administered 2024-02-14: 5 [IU] via SUBCUTANEOUS
  Filled 2024-02-14 (×2): qty 0.05

## 2024-02-14 MED ORDER — ORAL CARE MOUTH RINSE
15.0000 mL | OROMUCOSAL | Status: DC
  Administered 2024-02-15 (×2): 15 mL via OROMUCOSAL

## 2024-02-14 MED ORDER — HYDROMORPHONE HCL 1 MG/ML IJ SOLN
0.5000 mg | INTRAMUSCULAR | Status: DC | PRN
  Administered 2024-02-14: 0.5 mg via INTRAVENOUS
  Filled 2024-02-14: qty 0.5

## 2024-02-14 MED ORDER — MIDODRINE HCL 5 MG PO TABS
10.0000 mg | ORAL_TABLET | Freq: Three times a day (TID) | ORAL | Status: DC
  Administered 2024-02-14 – 2024-02-20 (×19): 10 mg via ORAL
  Filled 2024-02-14 (×19): qty 2

## 2024-02-14 MED ORDER — PROSOURCE TF20 ENFIT COMPATIBL EN LIQD
60.0000 mL | Freq: Two times a day (BID) | ENTERAL | Status: DC
  Administered 2024-02-14 – 2024-02-16 (×5): 60 mL
  Filled 2024-02-14 (×5): qty 60

## 2024-02-14 NOTE — TOC Progression Note (Signed)
 Transition of Care [facility]) - Progression Note    Patient Details  Name: PADRAIG NHAN MRN: [id number] Date of Birth: 06-13-1956  Transition of Care [facility]) CM/SW Contact  Ernst Heap Phone Number: [phone] 02/14/2024, 3:14 PM  Clinical Narrative:   2:58 PM- HF CSW attempted to meet with patient at bedside. Patient had family and nurse at bedside. CSW will follow up at a more appropriate time.   3:36 PM- HF CSW called the patients wife and left a VM. Will continue to make attempts to speak with the patient and/or family.   TOC will continue following.     Expected Discharge Plan: Skilled Nursing Facility Barriers to Discharge: Continued Medical Work up  Expected Discharge Plan and Services                                               Social Determinants of Health (SDOH) Interventions SDOH Screenings   Food Insecurity: No Food Insecurity (02/02/2024)  Housing: Low Risk  (02/02/2024)  Transportation Needs: No Transportation Needs (02/02/2024)  Utilities: Not At Risk (02/02/2024)  Social Connections: Moderately Isolated (02/02/2024)  Tobacco Use: High Risk (02/11/2024)    Readmission Risk Interventions    05/29/2023    4:41 PM 06/30/2021   11:53 AM  Readmission Risk Prevention Plan  Transportation Screening Complete Complete  Medication Review Oceanographer) Complete Complete  PCP or Specialist appointment within 3-5 days of discharge Complete   HRI or Home Care Consult Complete Complete  SW Recovery Care/Counseling Consult Complete Complete  Palliative Care Screening Not Applicable Not Applicable  Skilled Nursing Facility Complete Not Applicable

## 2024-02-14 NOTE — TOC Initial Note (Signed)
 Transition of Care [facility]) - Initial/Assessment Note    Patient Details  Name: [name] MRN: [id number] Date of Birth: 07/11/1956  Transition of Care [facility]) CM/SW Contact:    Benjiman Bras, RN Phone Number: [phone] 02/14/2024, 10:15 AM  Clinical Narrative:                  Patient is from [facility], [facility] CSW will follow for dc needs.    Expected Discharge Plan: Skilled Nursing Facility Barriers to Discharge: Continued Medical Work up   Patient Goals and CMS Choice            Expected Discharge Plan and Services                                              Prior Living Arrangements/Services                       Activities of Daily Living      Permission Sought/Granted                  Emotional Assessment              Admission diagnosis:  ESRD (end stage renal disease) on dialysis (HCC) [N18.6, Z99.2] Hypotension [I95.9] Hypotension, unspecified hypotension type [I95.9] Patient Active Problem List   Diagnosis Date Noted   Malnutrition of moderate degree 02/14/2024   History of Clostridioides difficile colitis 02/12/2024   Leukocytosis 02/12/2024   BPH (benign prostatic hyperplasia) 02/12/2024   Lactic acidosis 02/12/2024   Hypotension 02/11/2024   Hypomagnesemia 02/04/2024   Hypocalcemia 02/04/2024   Pressure injury of skin 02/04/2024   NSTEMI (non-ST elevated myocardial infarction) (HCC) 11/25/2023   Hypervolemia 11/20/2023   Rash and nonspecific skin eruption 11/19/2023   C. difficile colitis 09/25/2023   SIRS (systemic inflammatory response syndrome) (HCC) 09/22/2023   Hyperkalemia 07/19/2023   ESRD (end stage renal disease) on dialysis (HCC) 07/19/2023   Severe sepsis (HCC) 05/28/2023   Acute encephalopathy 05/28/2023   Acute respiratory failure with hypoxia (HCC) 05/28/2023   Precordial pain 03/31/2023   History of right coronary artery stent placement 03/31/2023   Current use of long term  anticoagulation 03/31/2023   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) 03/31/2023   Paroxysmal atrial fibrillation (HCC) 03/31/2023   Hypertensive crisis 03/31/2023   Unstable angina (HCC) 03/30/2023   Tobacco abuse 03/30/2023   Prolonged QT interval 03/30/2023   Elevated troponin 03/30/2023   Cellulitis 11/20/2022   S/P BKA (below knee amputation) unilateral, left (HCC) 07/04/2022   Subacute osteomyelitis, left ankle and foot (HCC) 07/03/2022   Malaise 01/13/2022   Anemia due to chronic kidney disease 01/13/2022   Obesity (BMI 30-39.9) 01/13/2022   Decubitus ulcer of buttock 01/13/2022   Intractable nausea and vomiting with epigastric pain 01/11/2022   Chronic osteomyelitis (HCC) 01/11/2022   Proliferative diabetic retinopathy of left eye with macular edema associated with type 2 diabetes mellitus (HCC) 11/27/2021   Vitreous hemorrhage of right eye (HCC) 11/27/2021   Sepsis (HCC) 09/02/2021   Severe sepsis with lactic acidosis (HCC) 06/14/2021   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) 06/14/2021   ESRD on hemodialysis with hyperkalemia 06/14/2021   Paroxysmal atrial fibrillation with RVR (HCC) 06/14/2021   Carotid stenosis 06/15/2020   Dyslipidemia 06/15/2020   Diabetic  macular edema of right eye with proliferative retinopathy associated with type 2 diabetes mellitus (HCC) 01/18/2020   Severe nonproliferative diabetic retinopathy of left eye, with macular edema, associated with type 2 diabetes mellitus (HCC) 01/04/2020   Retinal hemorrhage of right eye 01/04/2020   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   Pseudoclaudication 11/15/2018   S/P BKA (below knee amputation), right (HCC) 09/18/2018   Amputation of toe of left foot (HCC) 09/18/2018   Peripheral artery disease (HCC) 09/18/2018   S/P carotid endarterectomy 09/18/2018   OSA not tolerating CPAP 09/18/2018   Chronic pain 09/18/2018   Status post reversal of ileostomy 05/21/2018    Normocytic anemia 02/14/2018   Intra-abdominal abscess (HCC) 11/04/2017   Chronic venous hypertension (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 (HCC) 12/11/2016   PAD (peripheral artery disease) (HCC) 11/12/2016   Unilateral primary osteoarthritis, left knee 10/11/2016   History of right below knee amputation (HCC) 07/12/2016   Type 2 diabetes mellitus with diabetic neuropathy (HCC) 05/09/2016   Infected diabetic left foot ulcer with chronic osteomyelitis    Diabetic foot infection (HCC) 05/04/2016   Diabetes mellitus without complication (HCC) 12/16/2015   Amputated toe (HCC) 10/21/2015   Cellulitis of left lower extremity 09/03/2015   Leg edema, left 09/03/2015   CAD (dz of distal, mid and proximal RCA with implantation of 3 overlapping drug-eluting stent, with elevated troponin 09/03/2015   (HFpEF) heart failure with preserved ejection fraction (HCC) 09/03/2015   Hyperlipidemia 08/25/2014   Limb pain 03/20/2013   DJD (degenerative joint disease) 09/25/2012   Migraine 09/25/2012   Neuropathy 09/25/2012   Restless legs syndrome (RLS) 09/25/2012   COPD (chronic obstructive pulmonary disease) (HCC) 04/25/2012   Unknown cause of morbidity or mortality 04/25/2012   Chronic total occlusion of artery of the extremities (HCC) 04/08/2012   Onychomycosis 02/01/2012   Occlusion and stenosis of carotid artery without mention of cerebral infarction 06/12/2011   GERD (gastroesophageal reflux disease) 05/08/2011   Barrett's esophagus without dysplasia 05/08/2011   Former tobacco use 02/01/2011   PCP:  Aloha Arnold, PA-C Pharmacy:   [id number] [place], [place] - [address] AT [address] [place] [address] Phone: [phone] Fax: [phone]  [id number] - [place], [place] - [address] [place] [address] Phone: [phone] Fax: [phone]  [facility] - [place], [place] - [address] [place] [address] Phone: [phone] Fax: [phone]     Social Drivers of Health (SDOH) Social History: SDOH Screenings   Food Insecurity: No Food Insecurity (02/02/2024)  Housing: Low Risk  (02/02/2024)  Transportation Needs: No Transportation Needs (02/02/2024)  Utilities: Not At Risk (02/02/2024)  Social Connections: Moderately Isolated (02/02/2024)  Tobacco Use: High Risk (02/11/2024)   SDOH Interventions:     Readmission Risk Interventions    05/29/2023    4:41 PM 06/30/2021   11:53 AM  Readmission Risk Prevention Plan  Transportation Screening Complete Complete  Medication Review Oceanographer) Complete Complete  PCP or Specialist appointment within 3-5 days of discharge Complete   HRI or Home Care Consult Complete Complete  SW Recovery Care/Counseling Consult Complete Complete  Palliative Care Screening Not Applicable Not Applicable  Skilled Nursing Facility Complete Not Applicable

## 2024-02-14 NOTE — Progress Notes (Signed)
 Patient ID: [name], male   DOB: 26-Oct-1955, 68 y.o.   MRN: [id number]     Advanced Heart Failure Rounding Note  Cardiologist: Knox Perl, MD  Chief Complaint: Shock Subjective:    Yesterday had pericardiocentesis with 1280 cc bloody fluid removed and had placement of temporary transvenous pacemaker.  He had a further 570 cc of bloody fluid overnight.   He has not been pacing and is now in NSR in 70s with PVCs.   He remains on NE 12, vasopressin 0.04.  Has been hard to wean off pressors due to wide pulse pressure.  CVVH running even to net 50 cc/hr negative, CVP 7.   WBCs 30 => 22, afebrile.  Now on empiric vancomycin  + cefepime , also on po vancomycin  for C difficile treatment.  Not having diarrhea now.   Awake/alert but confused this morning.    Objective:   Weight Range: 114.4 kg Body mass index is 30.7 kg/m.   Vital Signs:   Temp:  [98.3 F (36.8 C)-98.5 F (36.9 C)] 98.5 F (36.9 C) (06/05 2100) Pulse Rate:  [64-181] 73 (06/06 0745) Resp:  [14-34] 22 (06/06 0745) BP: (83-147)/(63-137) 138/93 (06/05 0941) SpO2:  [94 %-100 %] 99 % (06/06 0745) Arterial Line BP: (73-224)/(20-46) 128/35 (06/06 0745) Weight:  [114.4 kg] 114.4 kg (06/06 0500) Last BM Date : 02/13/24  Weight change: Filed Weights   02/11/24 1517 02/13/24 0500 02/14/24 0500  Weight: 113.4 kg 119 kg 114.4 kg    Intake/Output:   Intake/Output Summary (Last 24 hours) at 02/14/2024 0821 Last data filed at 02/14/2024 0700 Gross per 24 hour  Intake 1415.99 ml  Output 1876.9 ml  Net -460.91 ml      Physical Exam    General: Chronically ill-appearing Neck: No JVD, no thyromegaly or thyroid  nodule.  Lungs: Clear to auscultation bilaterally with normal respiratory effort. CV: Nondisplaced PMI.  Heart regular S1/S2, no S3/S4, no murmur.  No peripheral edema.   Abdomen: Soft, nontender, no hepatosplenomegaly, no distention.  Skin: Intact without lesions or rashes.  Neurologic: Alert and oriented x 3.   Psych: Normal affect. Extremities: Bilateral BKAs HEENT: Normal.   Telemetry   NSR 70s with PVCs (personally reviewed)   Labs    CBC Recent Labs    02/11/24 1445 02/12/24 0522 02/13/24 1537 02/13/24 1550 02/14/24 0440  WBC 13.0*   < > 30.0*  --  22.3*  NEUTROABS 10.6*  --   --   --   --   HGB 10.5*   < > 9.6* 9.9* 9.2*  HCT 34.2*   < > 30.5* 29.0* 29.2*  MCV 94.0   < > 92.7  --  92.7  PLT 278   < > 331  --  311   < > = values in this interval not displayed.   Basic Metabolic Panel Recent Labs    40/10/27 0518 02/13/24 0840 02/13/24 1537 02/13/24 1550 02/14/24 0440  NA 135   < > 138 135 136  K 5.4*   < > 5.1 4.9 4.3  CL 97*  --  98  --  98  CO2 19*  --  21*  --  24  GLUCOSE 187*  --  238*  --  181*  BUN 45*  --  44*  --  34*  CREATININE 5.23*  --  4.89*  --  3.53*  CALCIUM  9.1  --  8.9  --  8.6*  MG 2.5*  --   --   --  2.1  PHOS 6.6*  --  6.9*  --  4.2   < > = values in this interval not displayed.   Liver Function Tests Recent Labs    02/11/24 1445 02/13/24 0518 02/13/24 1537 02/14/24 0440  AST 22  --   --   --   ALT 17  --   --   --   ALKPHOS 82  --   --   --   BILITOT 0.8  --   --   --   PROT 5.6*  --   --   --   ALBUMIN  2.6*   < > 2.8* 2.8*   < > = values in this interval not displayed.   No results for input(s): "LIPASE", "AMYLASE" in the last 72 hours. Cardiac Enzymes No results for input(s): "CKTOTAL", "CKMB", "CKMBINDEX", "TROPONINI" in the last 72 hours.  BNP: BNP (last 3 results) Recent Labs    02/12/24 1638  BNP 387.9*    ProBNP (last 3 results) No results for input(s): "PROBNP" in the last 8760 hours.   D-Dimer No results for input(s): "DDIMER" in the last 72 hours. Hemoglobin A1C No results for input(s): "HGBA1C" in the last 72 hours. Fasting Lipid Panel No results for input(s): "CHOL", "HDL", "LDLCALC", "TRIG", "CHOLHDL", "LDLDIRECT" in the last 72 hours. Thyroid  Function Tests No results for input(s): "TSH",  "T4TOTAL", "T3FREE", "THYROIDAB" in the last 72 hours.  Invalid input(s): "FREET3"  Other results:   Imaging    DG Abd 1 View Result Date: 02/14/2024 CLINICAL DATA:  Nasogastric tube placement EXAM: ABDOMEN - 1 VIEW COMPARISON:  02/12/2024 FINDINGS: Nasogastric tube appears looped within the distal esophagus with its tip position within the mid esophagus above the superior margin of the examination. Transvenous pacemaker lead is now seen approaching from the superior vena cava with its tip in indeterminate position. This may be within the right ventricle, coronary sinus, or potentially the left hepatic vein. Small left pleural effusion. Nonobstructive bowel gas pattern. No gross free intraperitoneal gas. IMPRESSION: 1. Nasogastric tube appears looped within the distal esophagus with its tip position within the mid esophagus above the superior margin of the examination. 2. Transvenous pacemaker lead in indeterminate position. Correlation for appropriate device function is recommended. If indicated, this could be better assessed with CT imaging. Electronically Signed   By: Worthy Heads M.D.   On: 02/14/2024 04:16   DG Chest Port 1 View Result Date: 02/13/2024 CLINICAL DATA:  [id number] Temporary transvenous cardiac pacemaker present [id number] EXAM: PORTABLE CHEST - 1 VIEW COMPARISON:  February 12, 2024 FINDINGS: Right IJ approach catheter sheath/transvenous pacemaker terminates in the right ventricle. Left IJ approach hemodialysis catheter terminates in the region of the left brachiocephalic vein. Likely cardiac lead overlying the midline chest and right upper chest. Bilateral perihilar interstitial opacities with patchy airspace opacities in both lung bases. Small to moderate left pleural effusion. Mild cardiomegaly. Cervical fusion hardware again noted. Eight down IMPRESSION: 1. Right IJ approach transvenous pacemaker terminates in the region of the right ventricle. 2. Similarly positioned left IJ central venous  catheter terminating in the region of the left brachiocephalic vein. 3. Lower lung volumes. Bilateral perihilar interstitial opacities, which may represent mild interstitial edema. Small to moderate left pleural effusion with patchy bibasilar airspace opacities, either atelectasis or changes of developing pulmonary edema. Electronically Signed   By: Rance Burrows M.D.   On: 02/13/2024 15:59   ECHOCARDIOGRAM LIMITED Result Date: 02/13/2024    ECHOCARDIOGRAM LIMITED REPORT   Patient  Name:   [name] Date of Exam: 02/13/2024 Medical Rec #:  [id number]     Height:       76.0 in Accession #:    [id number]    Weight:       262.3 lb Date of Birth:  02/03/1956     BSA:          2.486 m Patient Age:    67 years      BP:           138/93 mmHg Patient Gender: M             HR:           78 bpm. Exam Location:  Inpatient Procedure: 2D Echo and Saline Contrast Bubble Study (Both Spectral and Color            Flow Doppler were utilized during procedure). Indications:    Cardiac Tamponade  History:        Patient has prior history of Echocardiogram examinations, most                 recent 02/13/2024. CAD, COPD, Signs/Symptoms:Shortness of Breath;                 Risk Factors:Dyslipidemia, Former Smoker and Diabetes.  Sonographer:    Kip Peon RDCS Referring Phys: [id number] [facility] J PATWARDHAN  Sonographer Comments: Image acquisition challenging due to uncooperative patient. IMPRESSIONS  1. Post pericardiocentesis - marked reduction in pericardial fluid (subcentimeter). RV colapse is no longer present.  2. Left ventricular ejection fraction, by estimation, is 65 to 70%. The left ventricle has normal function. FINDINGS  Left Ventricle: Left ventricular ejection fraction, by estimation, is 65 to 70%. The left ventricle has normal function. Pericardium: Post pericardiocentesis - marked reduction in pericardial fluid (subcentimeter). RV colapse is no longer present. IAS/Shunts: Agitated saline contrast was given intravenously  to evaluate for intracardiac shunting. IVC IVC diam: 2.20 cm Dorothye Gathers MD Electronically signed by Dorothye Gathers MD Signature Date/Time: 02/13/2024/11:12:14 AM    Final    CARDIAC CATHETERIZATION Result Date: 02/13/2024 Successful temporary transvenous pacemaker placement through right intrajugular vein Successful pericardiocentesis, following 1280 cc hemorrhagic fluid drained Pericardial drain and temporary pacemaker sutured in place CRITICAL CARE Performed by: Fransico Ivy  Total critical care time: 35 minutes  Critical care time was exclusive of separately billable procedures and treating other patients.  Critical care was necessary to treat or prevent imminent or life-threatening deterioration.  Critical care was time spent personally by me on the following activities: In addition to billable procedures as above, I personally monitored and adjusted patient's vasopressor drips.  Given patient's hypotension to 70/30 mmHg, active off on Levophed drip up to 55 mics, down to 20 mics by the end of the procedure with improvement in hemodynamics.  Patient is also on vasopressin and epinephrine  drips. Manish J Patwardhan, MD    Medications:     Scheduled Medications:  arformoterol  15 mcg Nebulization BID   atorvastatin   80 mg Oral QHS   Chlorhexidine  Gluconate Cloth  6 each Topical Q0600   Chlorhexidine  Gluconate Cloth  6 each Topical Daily   clopidogrel   75 mg Oral Q breakfast   famotidine   20 mg Oral QODAY   insulin  aspart  0-6 Units Subcutaneous Q4H   midodrine   5 mg Oral Q8H   multivitamin  1 tablet Oral QHS   mupirocin  ointment  1 Application Nasal BID   revefenacin  175 mcg Nebulization Daily  rOPINIRole   2 mg Oral QHS   sevelamer  carbonate  1,600 mg Oral TID WC   thiamine  100 mg Per Tube Daily   vancomycin   125 mg Oral Daily    Infusions:  ceFEPime  (MAXIPIME ) IV Stopped (02/13/24 2323)   DOPamine Stopped (02/13/24 0834)   epinephrine  Stopped (02/13/24 1901)   heparin  Stopped  (02/13/24 1451)   norepinephrine (LEVOPHED) Adult infusion 12 mcg/min (02/14/24 0700)   PrismaSol BGK 2/3.5 1,500 mL/hr at 02/14/24 0813   prismasol BGK 4/2.5 400 mL/hr at 02/14/24 0644   prismasol BGK 4/2.5 400 mL/hr at 02/13/24 2250   vancomycin  Stopped (02/13/24 2019)   vasopressin 0.04 Units/min (02/14/24 0815)    PRN Medications: acetaminophen , atropine , heparin , oxyCODONE -acetaminophen  **OR** oxyCODONE -acetaminophen  **OR** HYDROmorphone  (DILAUDID ) injection **OR** HYDROmorphone  (DILAUDID ) injection, ipratropium-albuterol , melatonin, mouth rinse, polyethylene glycol, prochlorperazine     Assessment/Plan   1. Pericardial effusion: On 6/5 echo there was a large pericardial effusion with tamponade, pericardiocentesis with 1280 cc bloody fluid out.  Had 570 cc bloody fluid out overnight.  - Maintain drain in place, still with significant bloody output.  - Heparin  gtt held with ongoing bloody output and hgb decreasing.  - Limited echo today to assess effusion.   2. Bradycardia: Patient was on Coreg  and amiodarone  at home.  Noted to have long pauses up to 6 seconds on 6/4.  Initially started on dopamine, went into AF with HR 110s.  Dopamine stopped due to tachycardia.  Now has TTVP.  He is in NSR and now off dopamine.  No pacing overnight.  - Off nodal blockers.  - Maintain TTVP today.  - May need PPM though ESRD and recent infection make this higher risk.  3. Shock: Hypotensive with HD prior to admission, hypotensive yesterday associated with bradycardia and long pauses.  Echo in 3/25 showed EF 60-65%, mild LVH, mild RVE with normal systolic function, mild AS.  Initially worried that septic shock was main issue here, but echo 6/5 morning showed large pericardial effusion with concern for tamponade, LV EF 60-65%. Currently on NE 12, vasopressin 0.04.  - Add midodrine  10 mg tid.  - Aim for SBP > 110 given wide pulse pressure (no AI on echo) with pressor wean.  4. ID: WBCs 16 => 30 => 22, CXR  with possible LLL PNA.  Afebrile.   - Broad spectrum coverage with vancomycin /cefepime .  5. C difficile diarrhea: Diarrhea resolved.  Has been completing course of po vancomycin .  6. ESRD: CVVH currently, not ready for iHD with pressors requirement. CVP 7.  - Aim for UF net even to net negative 50 cc/hr today.  7. Atrial fibrillation: Now back in NSR.  - No nodal blockers with recent bradycardia.  - Heparin  gtt on hold with falling hgb and ongoing bloody pericardial drain output.   8. CAD: NSTEMI 3/25 s/p PCI/DES LCX, PTCA/shockwave lithotripsy p RCA. No chest pain.  - Continue Plavix .  Has not been on ASA with anticoagulation.  - Continue atorvastatin .  9. Anemia: Hgb falling, 9.2 today.  Follow closely, pm CBC.   CRITICAL CARE Performed by: Peder Bourdon  Total critical care time: 45 minutes  Critical care time was exclusive of separately billable procedures and treating other patients.  Critical care was necessary to treat or prevent imminent or life-threatening deterioration.  Critical care was time spent personally by me on the following activities: development of treatment plan with patient and/or surrogate as well as nursing, discussions with consultants, evaluation of patient's response to treatment, examination of  patient, obtaining history from patient or surrogate, ordering and performing treatments and interventions, ordering and review of laboratory studies, ordering and review of radiographic studies, pulse oximetry and re-evaluation of patient's condition.   Length of Stay: 3  Peder Bourdon, MD  02/14/2024, 8:21 AM  Advanced Heart Failure Team Pager [phone] (M-F; 7a - 5p)  Please contact [facility] for night-coverage after hours (5p -7a ) and weekends on [email or web address]

## 2024-02-14 NOTE — Evaluation (Signed)
 Clinical/Bedside Swallow Evaluation Patient Details  Name: [name] MRN: [id number] Date of Birth: 01-25-1956  Today's Date: 02/14/2024 Time: SLP Start Time (ACUTE ONLY): 1210 SLP Stop Time (ACUTE ONLY): 1219 SLP Time Calculation (min) (ACUTE ONLY): 9 min  Past Medical History:  Past Medical History:  Diagnosis Date   Carotid artery occlusion 11/10/2010   LEFT CAROTID ENDARTERECTOMY   Complication of anesthesia    BP WENT UP AT [facility] "   COPD (chronic obstructive pulmonary disease) (HCC)    pt denies this dx as of 06/01/20 - no inhaler    Diabetes mellitus without complication (HCC)    Diverticulitis    Diverticulosis of colon (without mention of hemorrhage)    DJD (degenerative joint disease)    knees/hands/feet/back/neck   ESRD (end stage renal disease) on dialysis (HCC)    Fatty liver    Full dentures    GERD (gastroesophageal reflux disease)    H/O hiatal hernia    History of blood transfusion    with a past surical procedure per patient 06/01/20   Hyperlipidemia    Hypertension    Neuromuscular disorder (HCC)    peripheral neuropathy   Non-pressure chronic ulcer of other part of left foot limited to breakdown of skin (HCC) 11/12/2016   Osteomyelitis (HCC)    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 Atherectomy left popliteal artery July 2012.    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   Unstable angina (HCC) 09/16/2018   Wears glasses    Past Surgical History:  Past Surgical History:  Procedure Laterality Date   ABDOMINAL AORTOGRAM W/LOWER EXTREMITY N/A 06/23/2021   Procedure: ABDOMINAL AORTOGRAM W/LOWER EXTREMITY;  Surgeon: Cody Das, MD;  [facility];  Service: Cardiovascular;  Laterality: N/A;   AMPUTATION  11/05/2011   Procedure: AMPUTATION RAY;  Surgeon:  Amada Backer, MD;  Location: [facility] OR;  Service: Orthopedics;  Laterality: Right;  Amputation of Right 4&5th Toes   AMPUTATION Left 11/26/2012   Procedure: AMPUTATION RAY;  Surgeon: Amada Backer, MD;  Location: [facility] OR;  Service: Orthopedics;  Laterality: Left;  fourth ray amputation   AMPUTATION Right 08/27/2014   Procedure: Transmetatarsal Amputation;  Surgeon: Timothy Ford, MD;  Location: [facility] OR;  Service: Orthopedics;  Laterality: Right;   AMPUTATION Right 01/14/2015   Procedure: AMPUTATION BELOW KNEE;  Surgeon: Timothy Ford, MD;  Location: [facility] OR;  Service: Orthopedics;  Laterality: Right;   AMPUTATION Left 10/21/2015   Procedure: Left Foot 5th Ray Amputation;  Surgeon: Timothy Ford, MD;  Location: [facility] OR;  Service: Orthopedics;  Laterality: Left;   AMPUTATION Left 07/04/2022   Procedure: LEFT BELOW KNEE AMPUTATION;  Surgeon: Timothy Ford, MD;  Location: [facility] OR;  Service: Orthopedics;  Laterality: Left;   ANTERIOR FUSION CERVICAL SPINE  02/06/06   C4-5, C5-6, C6-7; SURGEON DR. MAX COHEN   AV FISTULA PLACEMENT Left 06/02/2020   Procedure: ARTERIOVENOUS (AV) FISTULA CREATION LEFT;  Surgeon: Adine Hoof, MD;  Location: [facility] OR;  Service: Vascular;  Laterality: Left;   BACK SURGERY     x 3   BASCILIC VEIN TRANSPOSITION Left 07/21/2020   Procedure: LEFT UPPER ARM ATERIOVENOUS SUPERFISTULALIZATION;  Surgeon: Adine Hoof, MD;  Location: [facility] OR;  Service: Vascular;  Laterality: Left;   BELOW KNEE LEG AMPUTATION Right    CARDIAC CATHETERIZATION  10/31/04   2009   CAROTID ENDARTERECTOMY  11/10/10   CAROTID ENDARTERECTOMY Left 11/10/2010   Subtotal occlusion of left internal carotid artery with left hemispheric transient ischemic attacks.   CAROTID STENT     CARPAL TUNNEL RELEASE Right 10/21/2013   Procedure: RIGHT CARPAL TUNNEL RELEASE;  Surgeon: Kemp Patter, MD;  Location: [facility];  Service: Orthopedics;  Laterality: Right;   CHOLECYSTECTOMY     COLON SURGERY      COLONOSCOPY     COLOSTOMY REVERSAL  05/21/2018   ileostomy reversal   CORONARY LITHOTRIPSY N/A 11/24/2023   Procedure: CORONARY LITHOTRIPSY;  Surgeon: Swaziland, Peter M, MD;  Location: [facility] INVASIVE CV LAB;  Service: Cardiovascular;  Laterality: N/A;   CORONARY STENT INTERVENTION N/A 11/24/2023   Procedure: CORONARY STENT INTERVENTION;  Surgeon: Swaziland, Peter M, MD;  Location: [facility] INVASIVE CV LAB;  Service: Cardiovascular;  Laterality: N/A;   CYSTOSCOPY WITH STENT PLACEMENT Bilateral 01/13/2018   Procedure: CYSTOSCOPY WITH BILATERAL URETERAL CATHETER PLACEMENT;  Surgeon: Andrez Banker, MD;  Location: [facility] ORS;  Service: Urology;  Laterality: Bilateral;   ESOPHAGEAL MANOMETRY Bilateral 07/19/2014   Procedure: ESOPHAGEAL MANOMETRY (EM);  Surgeon: Nannette Babe, MD;  Location: [facility];  Service: Gastroenterology;  Laterality: Bilateral;   EYE SURGERY Bilateral 2020   cataract   FEMORAL ARTERY STENT     x6   FINGER SURGERY     FOOT SURGERY  04/25/2016    EXCISION BASE 5TH METATARSAL AND PARTIAL CUBOID LEFT FOOT   HERNIA REPAIR     LEFT INGUINAL AND UMBILICAL REPAIRS   HERNIA REPAIR     I & D EXTREMITY Left 04/25/2016   Procedure: EXCISION BASE 5TH METATARSAL AND PARTIAL CUBOID LEFT FOOT;  Surgeon: Timothy Ford, MD;  Location: [facility];  Service: Orthopedics;  Laterality: Left;   ILEOSTOMY  01/13/2018   Procedure: ILEOSTOMY;  Surgeon: Adalberto Acton, MD;  Location: [facility];  Service: General;;   ILEOSTOMY CLOSURE N/A 05/21/2018   Procedure: ILEOSTOMY REVERSAL ERAS PATHWAY;  Surgeon: Adalberto Acton, MD;  Location: [facility];  Service: General;  Laterality: N/A;   IR RADIOLOGIST EVAL & MGMT  11/19/2017   IR RADIOLOGIST EVAL & MGMT  12/03/2017   IR RADIOLOGIST EVAL & MGMT  12/18/2017   JOINT REPLACEMENT Right 2001   Total knee   LAMINECTOMY     X 3 LUMBAR AND X 2 CERVICAL SPINE OPERATIONS   LAPAROSCOPIC CHOLECYSTECTOMY W/ CHOLANGIOGRAPHY  11/09/04   SURGEON DR. Annetta Barter   LEFT HEART CATH AND  CORONARY ANGIOGRAPHY N/A 09/16/2018   Procedure: LEFT HEART CATH AND CORONARY ANGIOGRAPHY;  Surgeon: Cody Das, MD;  [facility];  Service: Cardiovascular;  Laterality: N/A;   LEFT HEART CATH AND CORONARY ANGIOGRAPHY N/A 11/24/2023   Procedure: LEFT HEART CATH AND CORONARY ANGIOGRAPHY;  Surgeon: Swaziland, Peter M, MD;  Location: [facility] INVASIVE CV LAB;  Service: Cardiovascular;  Laterality: N/A;   LEFT HEART CATHETERIZATION WITH CORONARY ANGIOGRAM N/A 10/29/2014   Procedure: LEFT HEART CATHETERIZATION WITH CORONARY ANGIOGRAM;  Surgeon: Jessica Morn, MD;  Location: [facility] CATH LAB;  Service: Cardiovascular;  Laterality: N/A;   LIGATION OF COMPETING BRANCHES OF ARTERIOVENOUS FISTULA Left 07/21/2020   Procedure: LIGATION OF COMPETING BRANCHES OF LEFT UPPER ARM ARTERIOVENOUS FISTULA;  Surgeon: Adine Hoof, MD;  Location: [facility] OR;  Service: Vascular;  Laterality: Left;  LOWER EXTREMITY ANGIOGRAM N/A 03/19/2012   Procedure: LOWER EXTREMITY ANGIOGRAM;  Surgeon: Odie Benne, MD;  Location: [facility] CATH LAB;  Service: Cardiovascular;  Laterality: N/A;   LOWER EXTREMITY ANGIOGRAPHY N/A 06/20/2021   Procedure: LOWER EXTREMITY ANGIOGRAPHY;  Surgeon: Cody Das, MD;  [facility]: [facility];  Service: Cardiovascular;  Laterality: N/A;   NECK SURGERY     PARTIAL COLECTOMY N/A 01/13/2018   Procedure: LAPAROSCOPIC ASSISTED   SIGMOID COLECTOMY ILEOSTOMY;  Surgeon: Adalberto Acton, MD;  Location: [facility]S;  Service: General;  Laterality: N/A;   PENILE PROSTHESIS IMPLANT  08/14/05   INFRAPUBIC INSERTION OF INFLATABLE PENILE PROSTHESIS; SURGEON DR. Luster Salters   PENILE PROSTHESIS IMPLANT     PERCUTANEOUS CORONARY STENT INTERVENTION (PCI-S) Right 10/29/2014   Procedure: PERCUTANEOUS CORONARY STENT INTERVENTION (PCI-S);  Surgeon: Jessica Morn, MD;  Location: [facility] CATH LAB;  Service: Cardiovascular;  Laterality: Right;   PERICARDIOCENTESIS N/A 02/13/2024   Procedure:  PERICARDIOCENTESIS;  Surgeon: Cody Das, MD;  [facility];  Service: Cardiovascular;  Laterality: N/A;   PERIPHERAL VASCULAR INTERVENTION Left 06/23/2021   Procedure: PERIPHERAL VASCULAR INTERVENTION;  Surgeon: Cody Das, MD;  [facility];  Service: Cardiovascular;  Laterality: Left;   REMOVAL OF PENILE PROSTHESIS N/A 06/14/2021   Procedure: Removal of THREE piece inflatable penile prosthesis;  Surgeon: Samson Croak, MD;  Location: [facility] OR;  Service: Urology;  Laterality: N/A;   SHOULDER ARTHROSCOPY     SPINE SURGERY     TEMPORARY PACEMAKER N/A 02/13/2024   Procedure: TEMPORARY PACEMAKER;  Surgeon: Cody Das, MD;  [facility]: [facility];  Service: Cardiovascular;  Laterality: N/A;   TOE AMPUTATION Left    TONSILLECTOMY     TOTAL KNEE ARTHROPLASTY  07/2002   RIGHT KNEE ; SURGEON  DR. [name] ALSO HAD ARTHROSCOPIC RIGHT KNEE IN  10/2001   TOTAL KNEE ARTHROPLASTY     ULNAR NERVE TRANSPOSITION Right 10/21/2013   Procedure: RIGHT ELBOW  ULNAR NERVE DECOMPRESSION;  Surgeon: Kemp Patter, MD;  Location: [facility];  Service: Orthopedics;  Laterality: Right;   HPI:  68 year old male with recent admission for Cdiff colitis who presented to the emergency department on 6/3 weakness and hypotension, decreased PO intake. He was reportedly at dialysis when he became hypotensive after 1L removed to 80s systolic. Pt seen by SLP in prior years, once for a CT scan showing debris in airway. Clinical exams unrevealing other than reports of GER-like symptoms. PMH of COPD, insulin -dependent T2DM, GERD, CAD, PAD, HLD, HTN, OSA, ESRD on HD TTS,    Assessment / Plan / Recommendation  Clinical Impression  Pt presents as very lethargic initially, does wake up with stimuli and answers questions with Y/N or short comments. Cannot follow all commands. Pt has clear vocal quality, stable respiratory function, no intubation this admission.  Pt is able to drink consecutively from a straw and accept teaspoons of puree. No signs of dysphagia or aspiration. He is very lethargic. Did not attempt chewable solids. Pt recommended to initiate a full liquid diet and can has sips if awake. Ok to advance to solids when he is fully alert. Will f/u to make sure diet advanced. SLP Visit Diagnosis: Dysphagia, unspecified (R13.10)    Aspiration Risk  Mild aspiration risk    Diet Recommendation Thin liquid    Liquid Administration via: Straw;Cup Medication Administration: Whole meds with liquid Supervision: Staff to assist with self feeding Compensations: Slow  rate;Small sips/bites Postural Changes: Seated upright at 90 degrees    Other  Recommendations Oral Care Recommendations: Oral care BID     Assistance Recommended at Discharge    Functional Status Assessment Patient has had a recent decline in their functional status and demonstrates the ability to make significant improvements in function in a reasonable and predictable amount of time.  Frequency and Duration min 2x/week  1 week       Prognosis Prognosis for improved oropharyngeal function: Good      Swallow Study   General HPI: 69 year old male with recent admission for Cdiff colitis who presented to the emergency department on 6/3 weakness and hypotension, decreased PO intake. He was reportedly at dialysis when he became hypotensive after 1L removed to 80s systolic. Pt seen by SLP in prior years, once for a CT scan showing debris in airway. Clinical exams unrevealing other than reports of GER-like symptoms. PMH of COPD, insulin -dependent T2DM, GERD, CAD, PAD, HLD, HTN, OSA, ESRD on HD TTS, Type of Study: Bedside Swallow Evaluation Previous Swallow Assessment: see HPI Diet Prior to this Study: NPO;Cortrak/Small bore NG tube Temperature Spikes Noted: No Respiratory Status: Room air History of Recent Intubation: No Behavior/Cognition: Lethargic/Drowsy;Requires cueing Oral  Cavity Assessment: Within Functional Limits Oral Care Completed by SLP: No Oral Cavity - Dentition: Edentulous;Dentures, top Vision: Impaired for self-feeding Self-Feeding Abilities: Total assist Patient Positioning: Upright in bed Baseline Vocal Quality: Normal Volitional Cough: Strong Volitional Swallow: Able to elicit    Oral/Motor/Sensory Function Overall Oral Motor/Sensory Function: Other (comment) (doesnt follow all commands but appears WNL)   Ice Chips Ice chips: Within functional limits Presentation: Spoon   Thin Liquid Thin Liquid: Within functional limits Presentation: Straw    Nectar Thick Nectar Thick Liquid: Not tested   Honey Thick Honey Thick Liquid: Not tested   Puree Puree: Within functional limits   Solid     Solid: Not tested      Ladawn Boullion, [name] 02/14/2024,1:24 PM

## 2024-02-14 NOTE — Progress Notes (Signed)
 NAME:  [name], MRN:  [id number], DOB:  [birth year], LOS: 3 ADMISSION DATE:  02/11/2024, CONSULTATION DATE:  02/12/2024 REFERRING MD:  Arbie Knock CHIEF COMPLAINT:  hypotension   History of Present Illness:  68 year old male with PMH of COPD, insulin -dependent T2DM, GERD, CAD, PAD, HLD, HTN, OSA, ESRD on HD TTS, recent admission for Cdiff colitis who presented to the emergency department on 6/3 weakness and hypotension, decreased PO intake. He was reportedly at dialysis when he became hypotensive after 1L removed to 80s systolic. In ED, Na 133, Cl 93, BUN 41/sCr 6.26, AG 16, WBC 13.0, hgb 10.5, trop 44, lactic 2.4. In ED he received 1L IVF, midodrine  with improvement in his BP. He was admitted to hospitalist. Hospitalist gave albumin  25g x2.   Pertinent  Medical History  COPD, insulin -dependent T2DM, GERD, CAD, PAD, HLD, HTN, OSA, ESRD on HD TTS, recent admission for Cdiff colitis   Significant Hospital Events: Including procedures, antibiotic start and stop dates in addition to other pertinent events   6/3 admitted after presenting for hypotension 6/4 rapid response called for bradycardic episodes and hypotension. PCCM consulted.  6/5 pericardial drain; initial drainage 1280cc, 570cc out in subsequent <24 hrs. TVP placed.   Interim History / Subjective:  No diarrhea this morning. Overnight had high OP from pericardial drain.   Objective    Blood pressure (!) 138/93, pulse 74, temperature 98.5 F (36.9 C), temperature source Axillary, resp. rate (!) 23, height 6\' 4"  (1.93 m), weight 114.4 kg, SpO2 99%. CVP:  [0 mmHg-20 mmHg] 8 mmHg      Intake/Output Summary (Last 24 hours) at 02/14/2024 0936 Last data filed at 02/14/2024 0900 Gross per 24 hour  Intake 1459.37 ml  Output 2084.9 ml  Net -625.53 ml   Filed Weights   02/11/24 1517 02/13/24 0500 02/14/24 0500  Weight: 113.4 kg 119 kg 114.4 kg    Examination: General: ill appearing man lying in bed in NAD HEENT: [place]/AT, eyes  anicteric Neuro: lethargic but arouses to stimulation,, moving all extremities Chest: breathing comfortably on [place], CTAB Heart: intermittently pacing, reg rate Abdomen: obese, soft, NT Extremities:  b/l BKA, mild thigh edema     Pericardial fluid: 6750 WBC, 88% PMNs, 6% lymphs  [place] 69% LA 12.4> 5.8 BUN 34 Cr 3.53 WBC 22.3 H/H 9.2/29.2 Platelets 311 Glucose 200s Blood cultures NGTD Pericardial fluid: few PMN, no organisms Pericardial fluid cytology pending  Resolved Hospital Problem list     Assessment & Plan:  Combined shock-  septic due to right lower lobe pneumonia, obstructive shock, due to large pericardial effusion with cardiac tamponade> relieved by pericardial drain Lactic acidosis due to shock -con't holding apixaban  due to bloody output from drain -con't antibiotics--vanc, cefepime   -follow cultures -con't percardial drain; appreciate cardiology's management   Symptomatic bradycardia in the setting of AV nodal blocking agents with beta-blocker and amiodarone ; s/p TVP Coronary Artery Disease s/p stent to ostial/proximal RCA and mid RCA, PCI proximal RCA 11/2023 Paroxysmal Atrial Fibrillation with RVR -TVP per cardiology -con't percardial drain -plavix , no aspirin  (usually on apixaban ), statin -Bblocker & amiodarone  on hold -holding PTA antihypertensives -con't vasopressors as needed to maintain MAP >65- NE, vaso. No longer requiring epi.  Recent C. Diff Colitis- during last admission, he was on vancomycin  which was supposed to be completed on 6/4. -prophylactic oral vanc -switched to cefepime  to reduce risk of recurrence   Acute respiratory failure with hypoxia Right lower lobe pneumonia -supplemental O2 to maintain SpO2 >90% -HAP antibiotics-  cefepime  & vanc; plan for 7 days  COPD, not acutely exacerbated -con't yupelri & brovana  ESRD  Hyperkalemia; resolved -CRRT per nephrology -renally dose meds  DMII -start glargine 5 units daily -SSI PRN   -goal BG 140-180  Concern for dysphagia -SLP consulted  PAD -statin, plavix   Anemia due to ESRD; 2g drop in Hb in the past few days -transfuse for Hb <7 or hemodynamically significant bleeding  Moderate protein energy malnutrition -cortrak today -start TF  Prolonged Qtc (even corrected for BBB) -avoid QT prolonging meds; d/c pepcid   Best Practice (right click and "Reselect all SmartList Selections" daily)   Diet/type: tubefeeds DVT prophylaxis: SCD; AC on hold Pressure ulcer(s). Clinically undetermined if the pressure ulcer was present on admission, see nursing notes GI prophylaxis: H2B Lines: Central line and Arterial Line Foley:  N/A Code Status:  full code Last date of multidisciplinary goals of care discussion [Discussed with patient, he wishes to be full code]  Labs   CBC: Recent Labs  Lab 02/11/24 1445 02/12/24 0522 02/12/24 1638 02/12/24 1651 02/13/24 0518 02/13/24 0840 02/13/24 1145 02/13/24 1537 02/13/24 1550 02/14/24 0440  WBC 13.0* 16.0* 27.8*  --  29.7*  --   --  30.0*  --  22.3*  NEUTROABS 10.6*  --   --   --   --   --   --   --   --   --   HGB 10.5* 10.5* 11.1*   < > 11.2* 11.9* 9.9* 9.6* 9.9* 9.2*  HCT 34.2* 33.6* 34.7*   < > 36.6* 35.0* 29.0* 30.5* 29.0* 29.2*  MCV 94.0 94.1 91.6  --  93.6  --   --  92.7  --  92.7  PLT 278 307 373  --  456*  --   --  331  --  311   < > = values in this interval not displayed.    Basic Metabolic Panel: Recent Labs  Lab 02/12/24 0522 02/12/24 1638 02/12/24 1651 02/13/24 0518 02/13/24 0840 02/13/24 1145 02/13/24 1537 02/13/24 1550 02/14/24 0440  NA 133* 134*   < > 135 131* 136 138 135 136  K 5.8* 5.7*   < > 5.4* 5.9* 5.3* 5.1 4.9 4.3  CL 90* 93*  --  97*  --   --  98  --  98  CO2 23 18*  --  19*  --   --  21*  --  24  GLUCOSE 183* 201*  --  187*  --   --  238*  --  181*  BUN 52* 61*  --  45*  --   --  44*  --  34*  CREATININE 7.44* 7.85*  --  5.23*  --   --  4.89*  --  3.53*  CALCIUM  8.5* 8.8*  --   9.1  --   --  8.9  --  8.6*  MG 2.2 2.1  --  2.5*  --   --   --   --  2.1  PHOS 8.8*  --   --  6.6*  --   --  6.9*  --  4.2   < > = values in this interval not displayed.   GFR: Estimated Creatinine Clearance: 27.3 mL/min (A) (by C-G formula based on SCr of 3.53 mg/dL (H)). Recent Labs  Lab 02/12/24 1638 02/12/24 1709 02/13/24 0518 02/13/24 0844 02/13/24 1149 02/13/24 1537 02/13/24 1544 02/14/24 0440  WBC 27.8*  --  29.7*  --   --  30.0*  --  22.3*  LATICACIDVEN  --  2.4*  --  12.4* 13.4*  --  5.8*  --     This patient is critically ill with multiple organ system failure which requires frequent high complexity decision making, assessment, support, evaluation, and titration of therapies. This was completed through the application of advanced monitoring technologies and extensive interpretation of multiple databases. During this encounter critical care time was devoted to patient care services described in this note for 42 minutes.  Joesph Mussel, DO 02/14/24 10:55 AM [facility]  For contact information, see Amion. If no response to pager, please call PCCM consult pager. After hours, 7PM- 7AM, please call Elink.

## 2024-02-14 NOTE — Procedures (Signed)
 Cortrak  Person Inserting Tube:  [name], Kerstin Peeling, RD Tube Type:  Cortrak - 43 inches Tube Size:  10 Tube Location:  Left nare Initial Placement:  Stomach Secured by: Bridle Technique Used to Measure Tube Placement:  Marking at nare/corner of mouth Cortrak Secured At:  75 cm   Cortrak Tube Team Note:  Consult received to place a Cortrak feeding tube.   No x-ray is required. RN may begin using tube.   If the tube becomes dislodged please keep the tube and contact the Cortrak team at [email or web address] for replacement.  If after hours and replacement cannot be delayed, place a NG tube and confirm placement with an abdominal x-ray.    Frederik Jansky, RD Registered Dietitian  See Amion for more information

## 2024-02-14 NOTE — Progress Notes (Addendum)
 Nutrition Follow-up  DOCUMENTATION CODES:  Non-severe (moderate) malnutrition in context of chronic illness  INTERVENTION:   Recommend holding Renvela  if unable to take in oral diet  Initiate TF via Cortrak:  Vital 1.5 at 20 ml/hr and DO NOT ADVANCE When appropriate, increase by 10ml/hr q12 hours until goal rate of 37ml/hr achieved  Pro-Source TF20 60 mL BID  TF providing 137 g of protein, 2320 kcals, 1094 mL  Continue Thiamine  100 mg daily  Continue Renal MVI daily  Monitor magnesium , potassium, and phosphorus daily for at least 3 days, MD to replete as needed  Monitor SLP notes for diet advancement and tolerance  NUTRITION DIAGNOSIS:  Moderate Malnutrition related to chronic illness as evidenced by moderate fat depletion, moderate muscle depletion, edema. - remains applicable  GOAL:  Patient will meet greater than or equal to 90% of their needs - progressing  MONITOR:  Labs, Weight trends, Diet advancement, Skin, I & O's  REASON FOR ASSESSMENT:  Consult Assessment of nutrition requirement/status  ASSESSMENT:  68 yo male admitted with shock, combined septic with RLL pneuminia and cardiogenic with large pericardial effusion with cardiac tamponade. +lactic acidosis, metabolic acidosis.  PMH includes COPD, DM, GERD, CAD, PAD, HLD, HTN, OSA, ESRD on HD, recent C.diff colitis. Surgical hx includes bilateral BKA, colectomy with ileostomy (reversed)  6/03 Admitted to 5W 6/04 Rapid response due to hypotension and bradycardia 6/05 Worsening shock, ECHO: pericardial effusion with cardiac tamponade requiring pericardiocentesis with 1280 mL drained, drain left in place; CRRT started 6/06 Cortrak placed; trickle feeds initiated   Remains on CRRT. Pt had significant output from pericardial drain overnight. Pressor requirements coming down, but difficult to wean.   MD agreeable to starting trickle feeds today, but holding there until more hemodynamically stable as evidenced by  improvement in MAPs and diastolic pressures.    Levophed  now down to 10, vasopressin  remains at 0.04. Epinephrine  stopped this morning. MAPs: 50s-60s this afternoon. Improved from yesterday. Diastolic: remains in the 30s CVP 9   Advanced to full liquid diet. Unable to start solid diet due to lethargy and not being able to follow all commands. No signs of dysphagia/aspiration with drinking. Did not attempt chewable solids. Speech stating he is appropriate to advance to solids when he is fully alert.  Admit Weight: 113.4kg Current Weight: 114.4kg EDW: 107kg   Still above EDW. CVVH running net even to net negative 50cc/hr. Pt with bilateral BKA. Noted height of 6'4" and unable to clarify if this is accurate pre-amputation height with pt yesterday. Remains confused today.    Intake/Output Summary (Last 24 hours) at 02/14/2024 1526 Last data filed at 02/14/2024 1200 Gross per 24 hour  Intake 1162.27 ml  Output 1920.1 ml  Net -757.83 ml    Net IO Since Admission: 187.49 mL [02/14/24 1526]    Noted pt recently discharged on 5/27 to SNF after admission for recurrent C.diff colitis and AMS with electrolyte abnormalities. Reported decreased PO intake upon arrival to ED. Risk for refeeding. Trending electrolytes.  Drains/Lines: Cortrak (10Fr): 75cm placed 02/14/2024 LUE: AVF: +t/b RIJ: venous sheath LIJ: triple lumen non-tunneled dialysis catheter A-line: R brachial Pericardial drain (6Fr): overnight + 20ml today  Sodium improved and trending normal. WBC elevated, but trending down. Blood sugars elevated, despite NPO status. Will likely need insulin  regimen adjustment s/p initiation of tube feedings. Monitoring hemoglobin closely.   Labs:  Sodium 136 (wdl) Potassium 5.1 (wdl) BUN 44 Creatinine 4.89 Phosphorus 6.9 (H) WBC 30.0>22.3>20.3 (H) CBGs 181-238 x24 hours  A1c 5.4 (01/2024)   Meds:  SS novolog  Semglee  Rena-Vite Renvela  (when PO intake resumes) Thiamine  IV ABX  Diet  Order:   Diet Order             Diet full liquid Room service appropriate? No; Fluid consistency: Thin  Diet effective now            EDUCATION NEEDS:  Not appropriate for education at this time  Skin:  Skin Assessment: Skin Integrity Issues: Skin Integrity Issues:: Stage I Stage I: coccyx  Last BM:  6/6 - type 3/5 x2  Height:  Ht Readings from Last 1 Encounters:  02/11/24 6\' 4"  (1.93 m)   Weight:  Wt Readings from Last 1 Encounters:  02/14/24 114.4 kg   BMI:  Body mass index is 30.7 kg/m.  Estimated Nutritional Needs:   Kcal:  2200-2400 kcals  Protein:  120-140 g  Fluid:  1000 mL plus UOP  Con Decant MS, RD, LDN Registered Dietitian Clinical Nutrition RD Inpatient Contact Info in Amion

## 2024-02-14 NOTE — Progress Notes (Signed)
 [facility] Progress Note  Subjective:  I/O yest net neg 530 cc Labs look good Wbc down 22K Levo at 7-12 micrograms/min this am  Vitals:   02/14/24 0915 02/14/24 0930 02/14/24 0945 02/14/24 1000  BP:      Pulse: 72 74 75 73  Resp: (!) 23 (!) 21 (!) 22 (!) 26  Temp:      TempSrc:      SpO2: 99% 98% 97% 97%  Weight:      Height:        Exam: Gen patient is lethargic in ICU, [place] O2 No jvd or bruits Chest clear bilat to bases RRR no MRG Abd soft ntnd no mass or ascites +bs Ext 1+ bilat pitting LE edema Neuro is as above     LUA AVF+bruit      Renal-related home meds: Renvela  2 AC 3 times daily Others: Eliquis , Lipitor, MVI, Plavix , Pepcid , Requip , Flomax , p.o. vancomycin       OP HD: [facility] 4h  B450   107kg   AVF  Heparin  none Last OP HD 5/31, post wt 112.5kg Partial HD 6/03, post wt 116.5kg (up 9kg), 1.5hr session, sent to ed Hectorol  2 mcg iv three times per week   Assessment/ Plan: Pericardial effusion w/ tamponade: s/p pericardiocentesis 6/05, per cards  Shock: remains on pressor support, coming down this am ESRD: usual HD TTS. Started CRRT on 6/05 due to shock. Cont CRRT.  Volume: hx of chronic vol overload, up 7kg mostly in LE's. UF 0-50 cc/hr net negative.  Anemia of esrd: Hb 9-11  here, follow.   Secondary hyperparathyroidism: CCa in range, phos a bit high. Cont binders if eating.  PAF: on eliquis  CAD: sp PCI w/ DES in march 2025 Recent Cdif colitis: on po vanc per pmd          Larry Poag MD  [facility] 02/14/2024, 11:40 AM  Recent Labs  Lab 02/13/24 1537 02/13/24 1550 02/14/24 0440  HGB 9.6* 9.9* 9.2*  ALBUMIN  2.8*  --  2.8*  CALCIUM  8.9  --  8.6*  PHOS 6.9*  --  4.2  CREATININE 4.89*  --  3.53*  K 5.1 4.9 4.3   No results for input(s): "IRON", "TIBC", "FERRITIN" in the last 168 hours. Inpatient medications:  arformoterol  15 mcg Nebulization BID   atorvastatin   80 mg Oral QHS   Chlorhexidine  Gluconate Cloth  6 each  Topical Q0600   Chlorhexidine  Gluconate Cloth  6 each Topical Daily   clopidogrel   75 mg Oral Q breakfast   insulin  aspart  0-6 Units Subcutaneous Q4H   insulin  glargine-yfgn  5 Units Subcutaneous Daily   midodrine   10 mg Oral Q8H   multivitamin  1 tablet Oral QHS   mupirocin  ointment  1 Application Nasal BID   revefenacin  175 mcg Nebulization Daily   rOPINIRole   2 mg Oral QHS   sevelamer  carbonate  1,600 mg Oral TID WC   thiamine  100 mg Per Tube Daily   vancomycin   125 mg Oral Daily    ceFEPime  (MAXIPIME ) IV 2 g (02/14/24 1112)   norepinephrine (LEVOPHED) Adult infusion 7 mcg/min (02/14/24 1000)   PrismaSol BGK 2/3.5 1,500 mL/hr at 02/14/24 0813   prismasol BGK 4/2.5 400 mL/hr at 02/14/24 0644   prismasol BGK 4/2.5 400 mL/hr at 02/13/24 2250   vancomycin  Stopped (02/13/24 2019)   vasopressin 0.04 Units/min (02/14/24 1000)   acetaminophen , atropine , heparin , oxyCODONE -acetaminophen  **OR** oxyCODONE -acetaminophen  **OR** HYDROmorphone  (DILAUDID ) injection **OR** HYDROmorphone  (DILAUDID )  injection, ipratropium-albuterol , melatonin, mouth rinse, polyethylene glycol, prochlorperazine 

## 2024-02-15 ENCOUNTER — Inpatient Hospital Stay (HOSPITAL_COMMUNITY)

## 2024-02-15 LAB — RENAL FUNCTION PANEL
Albumin: 2.5 g/dL — ABNORMAL LOW (ref 3.5–5.0)
Albumin: 2.6 g/dL — ABNORMAL LOW (ref 3.5–5.0)
Anion gap: 9 (ref 5–15)
Anion gap: 8 (ref 5–15)
BUN: 28 mg/dL — ABNORMAL HIGH (ref 8–23)
BUN: 26 mg/dL — ABNORMAL HIGH (ref 8–23)
CO2: 24 mmol/L (ref 22–32)
CO2: 25 mmol/L (ref 22–32)
Calcium: 8.9 mg/dL (ref 8.9–10.3)
Calcium: 8.7 mg/dL — ABNORMAL LOW (ref 8.9–10.3)
Chloride: 102 mmol/L (ref 98–111)
Chloride: 102 mmol/L (ref 98–111)
Creatinine, Ser: 2.6 mg/dL — ABNORMAL HIGH (ref 0.61–1.24)
Creatinine, Ser: 2.24 mg/dL — ABNORMAL HIGH (ref 0.61–1.24)
GFR, Estimated: 26 mL/min — ABNORMAL LOW (ref >60)
GFR, Estimated: 31 mL/min — ABNORMAL LOW (ref >60)
Glucose, Bld: 242 mg/dL — ABNORMAL HIGH (ref 70–99)
Glucose, Bld: 245 mg/dL — ABNORMAL HIGH (ref 70–99)
Phosphorus: 2.5 mg/dL (ref 2.5–4.6)
Phosphorus: 1.9 mg/dL — ABNORMAL LOW (ref 2.5–4.6)
Potassium: 3.9 mmol/L (ref 3.5–5.1)
Potassium: 3.9 mmol/L (ref 3.5–5.1)
Sodium: 135 mmol/L (ref 135–145)
Sodium: 135 mmol/L (ref 135–145)

## 2024-02-15 LAB — CBC
HCT: 27.3 % — ABNORMAL LOW (ref 39.0–52.0)
Hemoglobin: 8.5 g/dL — ABNORMAL LOW (ref 13.0–17.0)
MCH: 28.9 pg (ref 26.0–34.0)
MCHC: 31.1 g/dL (ref 30.0–36.0)
MCV: 92.9 fL (ref 80.0–100.0)
Platelets: 245 10*3/uL (ref 150–400)
RBC: 2.94 MIL/uL — ABNORMAL LOW (ref 4.22–5.81)
RDW: 16.9 % — ABNORMAL HIGH (ref 11.5–15.5)
WBC: 16 10*3/uL — ABNORMAL HIGH (ref 4.0–10.5)
nRBC: 0.1 % (ref 0.0–0.2)

## 2024-02-15 LAB — HEMOGLOBIN AND HEMATOCRIT, BLOOD
HCT: 27 % — ABNORMAL LOW (ref 39.0–52.0)
Hemoglobin: 8.6 g/dL — ABNORMAL LOW (ref 13.0–17.0)

## 2024-02-15 LAB — CULTURE, GROUP A STREP (THRC)

## 2024-02-15 LAB — GLUCOSE, CAPILLARY
Glucose-Capillary: 184 mg/dL — ABNORMAL HIGH (ref 70–99)
Glucose-Capillary: 186 mg/dL — ABNORMAL HIGH (ref 70–99)
Glucose-Capillary: 211 mg/dL — ABNORMAL HIGH (ref 70–99)
Glucose-Capillary: 212 mg/dL — ABNORMAL HIGH (ref 70–99)
Glucose-Capillary: 227 mg/dL — ABNORMAL HIGH (ref 70–99)
Glucose-Capillary: 236 mg/dL — ABNORMAL HIGH (ref 70–99)

## 2024-02-15 LAB — COOXEMETRY PANEL
Carboxyhemoglobin: 1.6 % — ABNORMAL HIGH (ref 0.5–1.5)
Methemoglobin: <0.7 % (ref 0.0–1.5)
O2 Saturation: 73.9 %
Total hemoglobin: 8.5 g/dL — ABNORMAL LOW (ref 12.0–16.0)

## 2024-02-15 LAB — MAGNESIUM: Magnesium: 2 mg/dL (ref 1.7–2.4)

## 2024-02-15 MED ORDER — SODIUM PHOSPHATES 45 MMOLE/15ML IV SOLN
30.0000 mmol | Freq: Once | INTRAVENOUS | Status: AC
  Administered 2024-02-15: 30 mmol via INTRAVENOUS
  Filled 2024-02-15: qty 10

## 2024-02-15 MED ORDER — VITAL 1.5 CAL PO LIQD
1000.0000 mL | ORAL | Status: DC
  Administered 2024-02-15 – 2024-02-16 (×2): 1000 mL
  Filled 2024-02-15: qty 1000

## 2024-02-15 MED ORDER — PRISMASOL BGK 4/2.5 32-4-2.5 MEQ/L EC SOLN
Status: DC
Start: 2024-02-15 — End: 2024-02-16

## 2024-02-15 MED ORDER — ORAL CARE MOUTH RINSE
15.0000 mL | OROMUCOSAL | Status: DC
  Administered 2024-02-15 – 2024-02-17 (×5): 15 mL via OROMUCOSAL

## 2024-02-15 MED ORDER — INSULIN GLARGINE-YFGN 100 UNIT/ML ~~LOC~~ SOLN
5.0000 [IU] | Freq: Two times a day (BID) | SUBCUTANEOUS | Status: DC
  Administered 2024-02-15 (×2): 5 [IU] via SUBCUTANEOUS
  Filled 2024-02-15 (×4): qty 0.05

## 2024-02-15 MED ORDER — INSULIN ASPART 100 UNIT/ML IJ SOLN
2.0000 [IU] | INTRAMUSCULAR | Status: DC
  Administered 2024-02-15 – 2024-02-16 (×7): 2 [IU] via SUBCUTANEOUS

## 2024-02-15 MED ORDER — ORAL CARE MOUTH RINSE: 15.0000 mL | OROMUCOSAL | Status: DC | PRN

## 2024-02-15 NOTE — Progress Notes (Signed)
 Patient ID: [name], male   DOB: December 01, 1955, 68 y.o.   MRN: [id number]     Advanced Heart Failure Rounding Note  Cardiologist: Knox Perl, MD  Chief Complaint: Shock Subjective:    Only 30mL of drain output yesterday, pressor requirement imrpoving.  Given no residual effusion on echo and my bedside echo I attempt to put drain this morning.  There was an extraordinary amount of resistance with pulling the drain, patient with a significant degree of pain during this time.  Under slow tension, over 20 cm so drain was removed, though difficult.  There was increase in drain amount and given pain the decision was made to stop and obtain CT scan to confirm drain placement.  Drain was noted to be within the pericardium, though had 400 mL of output since it was withdrawn.  Given increase in output as well as significant discomfort, will elect to leave in for an additional day.  Drain output decreasing over the course of the day.   Objective:   Weight Range: 111.5 kg Body mass index is 29.92 kg/m.   Vital Signs:   Temp:  [96.8 F (36 C)-98.4 F (36.9 C)] 96.8 F (36 C) (06/07 1500) Pulse Rate:  [66-121] 88 (06/07 1404) Resp:  [11-32] 24 (06/07 1404) SpO2:  [96 %-100 %] 100 % (06/07 1404) Arterial Line BP: (102-265)/(33-69) 116/44 (06/07 1404) Weight:  [111.5 kg] 111.5 kg (06/07 0500) Last BM Date : 02/14/24  Weight change: Filed Weights   02/13/24 0500 02/14/24 0500 02/15/24 0500  Weight: 119 kg 114.4 kg 111.5 kg    Intake/Output:   Intake/Output Summary (Last 24 hours) at 02/15/2024 1629 Last data filed at 02/15/2024 1600 Gross per 24 hour  Intake 2237.14 ml  Output 3390.9 ml  Net -1153.76 ml      Physical Exam    General: Chronically ill-appearing Lungs: Normal work of breathing CV: Irregular rate and rhythm, systolic murmur Abdomen: Soft, nontender, no hepatosplenomegaly, no distention.  Skin: Intact without lesions or rashes.  Neurologic: Alert and oriented x 3.   Psych: Normal affect. Extremities: Bilateral BKAs  Telemetry   Atrial fibrillation  Labs    CBC Recent Labs    02/14/24 1202 02/15/24 0330 02/15/24 1145  WBC 20.3* 16.0*  --   HGB 8.9* 8.5* 8.6*  HCT 28.3* 27.3* 27.0*  MCV 93.1 92.9  --   PLT 284 245  --    Basic Metabolic Panel Recent Labs    40/98/11 0440 02/14/24 1536 02/15/24 0330  NA 136 137 135  K 4.3 4.2 3.9  CL 98 100 102  CO2 24 22 24   GLUCOSE 181* 182* 242*  BUN 34* 29* 28*  CREATININE 3.53* 2.96* 2.60*  CALCIUM  8.6* 8.7* 8.9  MG 2.1  --  2.0  PHOS 4.2 3.4 2.5   Liver Function Tests Recent Labs    02/14/24 1536 02/15/24 0330  ALBUMIN  2.6* 2.5*   No results for input(s): "LIPASE", "AMYLASE" in the last 72 hours. Cardiac Enzymes No results for input(s): "CKTOTAL", "CKMB", "CKMBINDEX", "TROPONINI" in the last 72 hours.  BNP: BNP (last 3 results) Recent Labs    02/12/24 1638  BNP 387.9*    ProBNP (last 3 results) No results for input(s): "PROBNP" in the last 8760 hours.   D-Dimer No results for input(s): "DDIMER" in the last 72 hours. Hemoglobin A1C No results for input(s): "HGBA1C" in the last 72 hours. Fasting Lipid Panel No results for input(s): "CHOL", "HDL", "LDLCALC", "TRIG", "CHOLHDL", "LDLDIRECT"  in the last 72 hours. Thyroid  Function Tests No results for input(s): "TSH", "T4TOTAL", "T3FREE", "THYROIDAB" in the last 72 hours.  Invalid input(s): "FREET3"  Other results:   Medications:     Scheduled Medications:  arformoterol   15 mcg Nebulization BID   atorvastatin   80 mg Oral QHS   Chlorhexidine  Gluconate Cloth  6 each Topical Q0600   Chlorhexidine  Gluconate Cloth  6 each Topical Daily   clopidogrel   75 mg Oral Q breakfast   feeding supplement (PROSource TF20)  60 mL Per Tube BID   insulin  aspart  0-6 Units Subcutaneous Q4H   insulin  aspart  2 Units Subcutaneous Q4H   insulin  glargine-yfgn  5 Units Subcutaneous BID   midodrine   10 mg Oral Q8H   multivitamin  1  tablet Oral QHS   mupirocin  ointment  1 Application Nasal BID   mouth rinse  15 mL Mouth Rinse 4 times per day   revefenacin   175 mcg Nebulization Daily   rOPINIRole   2 mg Oral QHS   sevelamer  carbonate  1,600 mg Oral TID WC   thiamine   100 mg Per Tube Daily   vancomycin   125 mg Oral Daily    Infusions:  ceFEPime  (MAXIPIME ) IV Stopped (02/15/24 1233)   feeding supplement (VITAL 1.5 CAL) 1,000 mL (02/15/24 1603)   norepinephrine  (LEVOPHED ) Adult infusion 2 mcg/min (02/15/24 1600)   prismasol  BGK 4/2.5 400 mL/hr at 02/15/24 1528   prismasol  BGK 4/2.5 400 mL/hr at 02/14/24 1154   prismasol  BGK 4/2.5 1,500 mL/hr at 02/15/24 1528   vancomycin  Stopped (02/14/24 2233)   vasopressin  0.04 Units/min (02/15/24 1600)    PRN Medications: acetaminophen , atropine , heparin , oxyCODONE -acetaminophen  **OR** oxyCODONE -acetaminophen  **OR** HYDROmorphone  (DILAUDID ) injection **OR** HYDROmorphone  (DILAUDID ) injection, ipratropium-albuterol , melatonin, mouth rinse, polyethylene glycol, prochlorperazine     Assessment/Plan   1. Pericardial effusion: On 6/5 echo there was a large pericardial effusion with tamponade, pericardiocentesis with 1280 cc bloody fluid out.  Drainaged slowed 6/7 - Increase in drainage and significant pain while attempting to pull drain. Suspect some fibrosis, was concerned about drain placement but confirmed by CT - [place] and leave today, hopefully pull tomorrow - Hold heparin  gtt  2. Bradycardia: Patient was on Coreg  and amiodarone  at home.  Noted to have long pauses up to 6 seconds on 6/4.  Initially started on dopamine , went into AF with HR 110s.  Dopamine  stopped due to tachycardia.  Now has TTVP. Not requiring, rate turned to 40. Will likely remove tomorrow after he is off pressors.  - Off nodal blockers.  - Maintain TTVP today.  - Extremely poor candidate for device therapy   3. Shock: Hypotensive with HD prior to admission, improving slowly, NE at 2.  - Add midodrine  10  mg tid.  - Aim for SBP > 100 given wide pulse pressure (no AI on echo) with pressor wean.   4. ID: WBCs 16 => 30 => 22, CXR with possible LLL PNA.  Afebrile.   - Broad spectrum coverage with vancomycin /cefepime    5. C difficile diarrhea: Diarrhea resolved.  Has been completing course of po vancomycin .   6. ESRD: CVVH currently, not ready for iHD with pressors requirement. CVP 8 - Aim for UF net even to net negative 50 cc/hr today.   7. Atrial fibrillation: Now back in NSR.  - No nodal blockers with recent bradycardia.  - Heparin  gtt on hold with falling hgb and ongoing bloody pericardial drain output.    8. CAD: NSTEMI 3/25 s/p PCI/DES LCX, PTCA/shockwave lithotripsy  p RCA. No chest pain.  - Continue Plavix .  Has not been on ASA with anticoagulation.  - Continue atorvastatin .   9. Anemia: Hgb falling, 8.5 today.  CRITICAL CARE Performed by: Lauralee Poll  Total critical care time: 60 minutes  Critical care time was exclusive of separately billable procedures and treating other patients.  Critical care was necessary to treat or prevent imminent or life-threatening deterioration.  Critical care was time spent personally by me on the following activities: development of treatment plan with patient and/or surrogate as well as nursing, discussions with consultants, evaluation of patient's response to treatment, examination of patient, obtaining history from patient or surrogate, ordering and performing treatments and interventions, ordering and review of laboratory studies, ordering and review of radiographic studies, pulse oximetry and re-evaluation of patient's condition.   Length of Stay: 4  Lauralee Poll, MD  02/15/2024, 4:29 PM  Advanced Heart Failure Team Pager [phone] (M-F; 7a - 5p)  Please contact [facility] for night-coverage after hours (5p -7a ) and weekends on [email or web address]

## 2024-02-15 NOTE — Plan of Care (Signed)
  Problem: Education: Goal: Ability to describe self-care measures that may prevent or decrease complications (Diabetes Survival Skills Education) will improve Outcome: Progressing Goal: Individualized Educational Video(s) Outcome: Progressing   Problem: Coping: Goal: Ability to adjust to condition or change in health will improve Outcome: Progressing   Problem: Fluid Volume: Goal: Ability to maintain a balanced intake and output will improve Outcome: Progressing   Problem: Health Behavior/Discharge Planning: Goal: Ability to identify and utilize available resources and services will improve Outcome: Progressing Goal: Ability to manage health-related needs will improve Outcome: Progressing   Problem: Metabolic: Goal: Ability to maintain appropriate glucose levels will improve Outcome: Progressing   Problem: Nutritional: Goal: Maintenance of adequate nutrition will improve Outcome: Progressing Goal: Progress toward achieving an optimal weight will improve Outcome: Progressing   Problem: Skin Integrity: Goal: Risk for impaired skin integrity will decrease Outcome: Progressing   Problem: Tissue Perfusion: Goal: Adequacy of tissue perfusion will improve Outcome: Progressing   Problem: Education: Goal: Knowledge of General Education information will improve Description: Including pain rating scale, medication(s)/side effects and non-pharmacologic comfort measures Outcome: Progressing   Problem: Health Behavior/Discharge Planning: Goal: Ability to manage health-related needs will improve Outcome: Progressing   Problem: Clinical Measurements: Goal: Ability to maintain clinical measurements within normal limits will improve Outcome: Progressing Goal: Will remain free from infection Outcome: Progressing Goal: Diagnostic test results will improve Outcome: Progressing Goal: Respiratory complications will improve Outcome: Progressing Goal: Cardiovascular complication will  be avoided Outcome: Progressing   Problem: Activity: Goal: Risk for activity intolerance will decrease Outcome: Progressing   Problem: Nutrition: Goal: Adequate nutrition will be maintained Outcome: Progressing   Problem: Coping: Goal: Level of anxiety will decrease Outcome: Progressing   Problem: Elimination: Goal: Will not experience complications related to bowel motility Outcome: Progressing Goal: Will not experience complications related to urinary retention Outcome: Progressing   Problem: Pain Managment: Goal: General experience of comfort will improve and/or be controlled Outcome: Progressing   Problem: Safety: Goal: Ability to remain free from injury will improve Outcome: Progressing   Problem: Skin Integrity: Goal: Risk for impaired skin integrity will decrease Outcome: Progressing   Problem: Education: Goal: Understanding of CV disease, CV risk reduction, and recovery process will improve Outcome: Progressing Goal: Individualized Educational Video(s) Outcome: Progressing   Problem: Activity: Goal: Ability to return to baseline activity level will improve Outcome: Progressing   Problem: Cardiovascular: Goal: Ability to achieve and maintain adequate cardiovascular perfusion will improve Outcome: Progressing Goal: Vascular access site(s) Level 0-1 will be maintained Outcome: Progressing   Problem: Health Behavior/Discharge Planning: Goal: Ability to safely manage health-related needs after discharge will improve Outcome: Progressing   Problem: Education: Goal: Understanding of CV disease, CV risk reduction, and recovery process will improve Outcome: Progressing Goal: Individualized Educational Video(s) Outcome: Progressing   Problem: Activity: Goal: Ability to return to baseline activity level will improve Outcome: Progressing   Problem: Cardiovascular: Goal: Ability to achieve and maintain adequate cardiovascular perfusion will improve Outcome:  Progressing Goal: Vascular access site(s) Level 0-1 will be maintained Outcome: Progressing   Problem: Health Behavior/Discharge Planning: Goal: Ability to safely manage health-related needs after discharge will improve Outcome: Progressing

## 2024-02-15 NOTE — Progress Notes (Signed)
 NAME:  [name], MRN:  [id number], DOB:  August 06, 1956, LOS: 4 ADMISSION DATE:  02/11/2024, CONSULTATION DATE:  02/12/2024 REFERRING MD:  Arbie Knock CHIEF COMPLAINT:  hypotension   History of Present Illness:  68 year old male with PMH of COPD, insulin -dependent T2DM, GERD, CAD, PAD, HLD, HTN, OSA, ESRD on HD TTS, recent admission for Cdiff colitis who presented to the emergency department on 6/3 weakness and hypotension, decreased PO intake. He was reportedly at dialysis when he became hypotensive after 1L removed to 80s systolic. In ED, Na 133, Cl 93, BUN 41/sCr 6.26, AG 16, WBC 13.0, hgb 10.5, trop 44, lactic 2.4. In ED he received 1L IVF, midodrine  with improvement in his BP. He was admitted to hospitalist. Hospitalist gave albumin  25g x2.   Pertinent  Medical History  COPD, insulin -dependent T2DM, GERD, CAD, PAD, HLD, HTN, OSA, ESRD on HD TTS, recent admission for Cdiff colitis   Significant Hospital Events: Including procedures, antibiotic start and stop dates in addition to other pertinent events   6/3 admitted after presenting for hypotension 6/4 rapid response called for bradycardic episodes and hypotension. PCCM consulted.  6/5 pericardial drain; initial drainage 1280cc, 570cc out in subsequent <24 hrs. TVP placed.   Interim History / Subjective:  No diarrhea.  Remains on vaso, NE, CRRT.   Objective    Blood pressure (!) 138/93, pulse 82, temperature (!) 97.5 F (36.4 C), temperature source Axillary, resp. rate (!) 27, height 6\' 4"  (1.93 m), weight 111.5 kg, SpO2 99%. CVP:  [6 mmHg-16 mmHg] 12 mmHg      Intake/Output Summary (Last 24 hours) at 02/15/2024 0819 Last data filed at 02/15/2024 0800 Gross per 24 hour  Intake 2052.65 ml  Output 2941.3 ml  Net -888.65 ml   Filed Weights   02/13/24 0500 02/14/24 0500 02/15/24 0500  Weight: 119 kg 114.4 kg 111.5 kg    Examination: General: chronically ill appearing man sitting up in bed in NAD HEENT: [place]/AT, eyes anicteric Neuro:  lethargic, arouses to stimulation, answering some questions Chest: breathing comfortably on [address], reduced basilar breath sounds, no rhonchi or wheezing Heart: S1S2, reg rate, irreg rhtyhm. Mild dark bloody output from pericardial drain. Abdomen: obese, soft, NT Extremities:  b/l BKA, no significant edema.      Pericardial fluid: 6750 WBC, 88% PMNs, 6% lymphs  Coox 74% BUN 44 Cr 4.89 WBC 16 H/H 8.5/27.3 Platelets 245 Glucose 200s Blood cultures NGTD Pericardial fluid: few PMN, no organisms Pericardial fluid cytology pending  Resolved Hospital Problem list     Assessment & Plan:  Combined shock-  septic due to right lower lobe pneumonia, obstructive shock, due to large pericardial effusion with cardiac tamponade> relieved by pericardial drain Lactic acidosis due to shock -con't holding apixaban  until pericardial drain removed -con't antibiotics- vanc, cefepime   -con't following cultures -pericardial drain management per cardiology -Etiology of pericarditis and effusion unknown-- suspect HD, but came in with ok BUN. Possibly serositis from hydralazine - DILE? Check ANA today.  Symptomatic bradycardia in the setting of AV nodal blocking agents with beta-blocker and amiodarone ; s/p TVP Rate controlled Afib Coronary Artery Disease s/p stent to ostial/proximal RCA and mid RCA, PCI proximal RCA 11/2023 Paroxysmal Atrial Fibrillation with RVR -TVP per cardiology -pericardial drain- removing today -con't statin, plavix , no aspirin .  -Bblocker and amiodarone  on hold -con't holding PTA antihypertensives -con't vasopressors as needed to maintain MAP >65- NE, vaso. No longer requiring epi.  Recent C. Diff Colitis- during last admission, he was on vancomycin  which was  supposed to be completed on 6/4. -prophylactic oral vanc -switched zosyn  to cefepime  to reduce C diff recurrence risk   Acute respiratory failure with hypoxia Right lower lobe pneumonia Bilateral pleural effusions> likely  2/2 ESRD. L>R, both dependent. -con't supplemental O2 to maintain SpO2 >90% -con't pulling with CRRT -con't HAP antibiotics- cefepime  & vanc; plan for 7 days ( day #4) -if pressors not able to come off or rising, need to consider left thora. R effusion not likely large enough to drain  COPD, not acutely exacerbated -con't brovana  & yupelri   ESRD  Hyperkalemia; resolved -CRRT per nephrology -renally dose meds, avoid nephrotoxic meds  DMII with uncontrolled hyperglycemia -increase glargine 5 units BID -SSI PRN -adding aspart 2 units q4h TF coverage -goal BG 140-180  Concern for dysphagia -SLP consult -con't TF via cortrak  PAD -plavix , statin; can restart aspirin  once pericardial drainage slowed  Anemia due to ESRD; 3g drop in Hb in the past few days likely due to hemorrhagic pericarditis -transfuse for Hb <7 or hemodynamically significant bleeding -monitor  Moderate protein energy malnutrition -cortrak ; advance TF to 30cc/h with pressors decreasing  Prolonged Qtc (even corrected for BBB) -avoid Qtc prolonging meds -monitor on tele  GG pulmonary nodule RUL; incidental finding  -needs OP follow up CT in 6-12 months  Best Practice (right click and "Reselect all SmartList Selections" daily)   Diet/type: tubefeeds DVT prophylaxis: SCD; AC on hold Pressure ulcer(s). Clinically undetermined if the pressure ulcer was present on admission, see nursing notes GI prophylaxis: H2B Lines: Central line and Arterial Line Foley:  N/A Code Status:  full code Last date of multidisciplinary goals of care discussion [Discussed with patient, he wishes to be full code]  Labs   CBC: Recent Labs  Lab 02/11/24 1445 02/12/24 0522 02/13/24 0518 02/13/24 0840 02/13/24 1537 02/13/24 1550 02/14/24 0440 02/14/24 1202 02/15/24 0330  WBC 13.0*   < > 29.7*  --  30.0*  --  22.3* 20.3* 16.0*  NEUTROABS 10.6*  --   --   --   --   --   --   --   --   HGB 10.5*   < > 11.2*   < > 9.6* 9.9*  9.2* 8.9* 8.5*  HCT 34.2*   < > 36.6*   < > 30.5* 29.0* 29.2* 28.3* 27.3*  MCV 94.0   < > 93.6  --  92.7  --  92.7 93.1 92.9  PLT 278   < > 456*  --  331  --  311 284 245   < > = values in this interval not displayed.    Basic Metabolic Panel: Recent Labs  Lab 02/12/24 0522 02/12/24 1638 02/12/24 1651 02/13/24 0518 02/13/24 0840 02/13/24 1537 02/13/24 1550 02/14/24 0440 02/14/24 1536 02/15/24 0330  NA 133* 134*   < > 135   < > 138 135 136 137 135  K 5.8* 5.7*   < > 5.4*   < > 5.1 4.9 4.3 4.2 3.9  CL 90* 93*  --  97*  --  98  --  98 100 102  CO2 23 18*  --  19*  --  21*  --  24 22 24   GLUCOSE 183* 201*  --  187*  --  238*  --  181* 182* 242*  BUN 52* 61*  --  45*  --  44*  --  34* 29* 28*  CREATININE 7.44* 7.85*  --  5.23*  --  4.89*  --  3.53* 2.96* 2.60*  CALCIUM  8.5* 8.8*  --  9.1  --  8.9  --  8.6* 8.7* 8.9  MG 2.2 2.1  --  2.5*  --   --   --  2.1  --  2.0  PHOS 8.8*  --   --  6.6*  --  6.9*  --  4.2 3.4 2.5   < > = values in this interval not displayed.   GFR: Estimated Creatinine Clearance: 36.7 mL/min (A) (by C-G formula based on SCr of 2.6 mg/dL (H)). Recent Labs  Lab 02/12/24 1709 02/13/24 0518 02/13/24 0844 02/13/24 1149 02/13/24 1537 02/13/24 1544 02/14/24 0440 02/14/24 1202 02/15/24 0330  WBC  --    < >  --   --  30.0*  --  22.3* 20.3* 16.0*  LATICACIDVEN 2.4*  --  12.4* 13.4*  --  5.8*  --   --   --    < > = values in this interval not displayed.    This patient is critically ill with multiple organ system failure which requires frequent high complexity decision making, assessment, support, evaluation, and titration of therapies. This was completed through the application of advanced monitoring technologies and extensive interpretation of multiple databases. During this encounter critical care time was devoted to patient care services described in this note for 41 minutes.  Joesph Mussel, DO 02/15/24 3:38 PM [facility]  For  contact information, see Amion. If no response to pager, please call PCCM consult pager. After hours, 7PM- 7AM, please call Elink.

## 2024-02-15 NOTE — Progress Notes (Signed)
 [name] Progress Note  Subjective:  I/O net neg 0.8 L yesterday I/O net eng 820 cc today so far Levo gtt down from 15-20 yest to 2- 6 micrograms/min today Wt's are down   Vitals:   02/15/24 1049 02/15/24 1050 02/15/24 1051 02/15/24 1052  BP:      Pulse: 84 82 76 78  Resp: 19 (!) 23 14 14   Temp:      TempSrc:      SpO2: 100% 100% 96% 99%  Weight:      Height:        Exam: Gen lethargic, on room air today No jvd or bruits Chest clear bilat to bases RRR no MRG Abd soft ntnd no mass or ascites +bs Ext 1+ bilat pitting LE edema Neuro is as above     LUA AVF+bruit      Renal-related home meds: Renvela  2 AC 3 times daily Others: Eliquis , Lipitor, MVI, Plavix , Pepcid , Requip , Flomax , p.o. vancomycin       OP HD: [facility] 4h  B450   107kg   AVF  Heparin  none Last OP HD 5/31, post wt 112.5kg Partial HD 6/03, post wt 116.5kg (up 9kg), 1.5hr session, sent to ed Hectorol  2 mcg iv three times per week   Assessment/ Plan: Pericardial effusion/ tamponade: s/p pericardiocentesis 6/05 per cardiology Shock: pressor support has come down considerably ESRD: usual HD TTS. Started CRRT on 6/05 due to shock. Cont CRRT.  Volume: hx of chronic vol overload, up 7kg mostly in LE's. ^UF to 50-100 cc/hr as pressors almost off.  Anemia of esrd: Hb 9-11  here, follow.   Secondary hyperparathyroidism: CCa in range, phos a bit high. Cont binders if eating.  PAF: on eliquis  CAD: sp PCI w/ DES in march 2025 Recent Cdif colitis: on po vanc per pmd          Larry Poag MD  [facility] 02/15/2024, 10:58 AM  Recent Labs  Lab 02/14/24 1202 02/14/24 1536 02/15/24 0330  HGB 8.9*  --  8.5*  ALBUMIN   --  2.6* 2.5*  CALCIUM   --  8.7* 8.9  PHOS  --  3.4 2.5  CREATININE  --  2.96* 2.60*  K  --  4.2 3.9   No results for input(s): "IRON", "TIBC", "FERRITIN" in the last 168 hours. Inpatient medications:  arformoterol   15 mcg Nebulization BID   atorvastatin   80 mg Oral QHS    Chlorhexidine  Gluconate Cloth  6 each Topical Q0600   Chlorhexidine  Gluconate Cloth  6 each Topical Daily   clopidogrel   75 mg Oral Q breakfast   feeding supplement (PROSource TF20)  60 mL Per Tube BID   insulin  aspart  0-6 Units Subcutaneous Q4H   insulin  glargine-yfgn  5 Units Subcutaneous BID   midodrine   10 mg Oral Q8H   multivitamin  1 tablet Oral QHS   mupirocin  ointment  1 Application Nasal BID   mouth rinse  15 mL Mouth Rinse 4 times per day   revefenacin   175 mcg Nebulization Daily   rOPINIRole   2 mg Oral QHS   sevelamer  carbonate  1,600 mg Oral TID WC   thiamine   100 mg Per Tube Daily   vancomycin   125 mg Oral Daily    ceFEPime  (MAXIPIME ) IV Stopped (02/15/24 0011)   feeding supplement (VITAL 1.5 CAL) 20 mL/hr at 02/15/24 1000   norepinephrine  (LEVOPHED ) Adult infusion 2 mcg/min (02/15/24 1000)   PrismaSol  BGK 2/3.5 1,500 mL/hr at 02/15/24 0728   prismasol   BGK 4/2.5 400 mL/hr at 02/14/24 1918   prismasol  BGK 4/2.5 400 mL/hr at 02/14/24 1154   vancomycin  Stopped (02/14/24 2233)   vasopressin  0.04 Units/min (02/15/24 1000)   acetaminophen , atropine , heparin , oxyCODONE -acetaminophen  **OR** oxyCODONE -acetaminophen  **OR** HYDROmorphone  (DILAUDID ) injection **OR** HYDROmorphone  (DILAUDID ) injection, ipratropium-albuterol , melatonin, mouth rinse, polyethylene glycol, prochlorperazine 

## 2024-02-15 NOTE — Plan of Care (Signed)

## 2024-02-16 LAB — CBC
HCT: 27.4 % — ABNORMAL LOW (ref 39.0–52.0)
Hemoglobin: 8.7 g/dL — ABNORMAL LOW (ref 13.0–17.0)
MCH: 29.6 pg (ref 26.0–34.0)
MCHC: 31.8 g/dL (ref 30.0–36.0)
MCV: 93.2 fL (ref 80.0–100.0)
Platelets: 183 10*3/uL (ref 150–400)
RBC: 2.94 MIL/uL — ABNORMAL LOW (ref 4.22–5.81)
RDW: 16.9 % — ABNORMAL HIGH (ref 11.5–15.5)
WBC: 11.3 10*3/uL — ABNORMAL HIGH (ref 4.0–10.5)
nRBC: 0 % (ref 0.0–0.2)

## 2024-02-16 LAB — CULTURE, BLOOD (ROUTINE X 2)
Culture: NO GROWTH
Culture: NO GROWTH
Special Requests: ADEQUATE
Special Requests: ADEQUATE

## 2024-02-16 LAB — GLUCOSE, CAPILLARY
Glucose-Capillary: 169 mg/dL — ABNORMAL HIGH (ref 70–99)
Glucose-Capillary: 197 mg/dL — ABNORMAL HIGH (ref 70–99)
Glucose-Capillary: 205 mg/dL — ABNORMAL HIGH (ref 70–99)
Glucose-Capillary: 146 mg/dL — ABNORMAL HIGH (ref 70–99)
Glucose-Capillary: 117 mg/dL — ABNORMAL HIGH (ref 70–99)
Glucose-Capillary: 162 mg/dL — ABNORMAL HIGH (ref 70–99)

## 2024-02-16 LAB — RENAL FUNCTION PANEL
Albumin: 2.7 g/dL — ABNORMAL LOW (ref 3.5–5.0)
Anion gap: 9 (ref 5–15)
BUN: 22 mg/dL (ref 8–23)
CO2: 24 mmol/L (ref 22–32)
Calcium: 8.5 mg/dL — ABNORMAL LOW (ref 8.9–10.3)
Chloride: 103 mmol/L (ref 98–111)
Creatinine, Ser: 1.91 mg/dL — ABNORMAL HIGH (ref 0.61–1.24)
GFR, Estimated: 37 mL/min — ABNORMAL LOW (ref >60)
Glucose, Bld: 191 mg/dL — ABNORMAL HIGH (ref 70–99)
Phosphorus: 2.3 mg/dL — ABNORMAL LOW (ref 2.5–4.6)
Potassium: 3.9 mmol/L (ref 3.5–5.1)
Sodium: 136 mmol/L (ref 135–145)

## 2024-02-16 LAB — COOXEMETRY PANEL
Carboxyhemoglobin: 1.9 % — ABNORMAL HIGH (ref 0.5–1.5)
Methemoglobin: <0.7 % (ref 0.0–1.5)
O2 Saturation: 77.5 %
Total hemoglobin: 8.9 g/dL — ABNORMAL LOW (ref 12.0–16.0)

## 2024-02-16 LAB — BODY FLUID CULTURE W GRAM STAIN: Culture: NO GROWTH

## 2024-02-16 LAB — MAGNESIUM: Magnesium: 2.2 mg/dL (ref 1.7–2.4)

## 2024-02-16 MED ORDER — VITAL 1.5 CAL PO LIQD: 1000.0000 mL | ORAL | Status: DC

## 2024-02-16 MED ORDER — VANCOMYCIN VARIABLE DOSE PER UNSTABLE RENAL FUNCTION (PHARMACIST DOSING): Status: AC

## 2024-02-16 MED ORDER — VITAL 1.5 CAL PO LIQD
1000.0000 mL | ORAL | Status: DC
  Administered 2024-02-16: 1000 mL

## 2024-02-16 MED ORDER — INSULIN GLARGINE-YFGN 100 UNIT/ML ~~LOC~~ SOLN
7.0000 [IU] | Freq: Two times a day (BID) | SUBCUTANEOUS | Status: DC
  Administered 2024-02-16 – 2024-02-21 (×9): 7 [IU] via SUBCUTANEOUS
  Filled 2024-02-16 (×13): qty 0.07

## 2024-02-16 MED ORDER — SODIUM CHLORIDE 0.9 % IV SOLN
2.0000 g | INTRAVENOUS | Status: AC
  Administered 2024-02-17 – 2024-02-18 (×2): 2 g via INTRAVENOUS
  Filled 2024-02-16 (×2): qty 12.5

## 2024-02-16 MED ORDER — INSULIN ASPART 100 UNIT/ML IJ SOLN: 3.0000 [IU] | INTRAMUSCULAR | Status: DC

## 2024-02-16 MED ORDER — GABAPENTIN 100 MG PO CAPS
100.0000 mg | ORAL_CAPSULE | Freq: Every day | ORAL | Status: DC
  Administered 2024-02-17 – 2024-02-20 (×4): 100 mg via ORAL
  Filled 2024-02-16 (×4): qty 1

## 2024-02-16 NOTE — Plan of Care (Signed)
  Problem: Education: Goal: Ability to describe self-care measures that may prevent or decrease complications (Diabetes Survival Skills Education) will improve Outcome: Progressing Goal: Individualized Educational Video(s) Outcome: Progressing   Problem: Coping: Goal: Ability to adjust to condition or change in health will improve Outcome: Progressing   Problem: Fluid Volume: Goal: Ability to maintain a balanced intake and output will improve Outcome: Progressing   Problem: Health Behavior/Discharge Planning: Goal: Ability to identify and utilize available resources and services will improve Outcome: Progressing Goal: Ability to manage health-related needs will improve Outcome: Progressing   Problem: Metabolic: Goal: Ability to maintain appropriate glucose levels will improve Outcome: Progressing   Problem: Nutritional: Goal: Maintenance of adequate nutrition will improve Outcome: Progressing Goal: Progress toward achieving an optimal weight will improve Outcome: Progressing   Problem: Skin Integrity: Goal: Risk for impaired skin integrity will decrease Outcome: Progressing   Problem: Tissue Perfusion: Goal: Adequacy of tissue perfusion will improve Outcome: Progressing   Problem: Education: Goal: Knowledge of General Education information will improve Description: Including pain rating scale, medication(s)/side effects and non-pharmacologic comfort measures Outcome: Progressing   Problem: Health Behavior/Discharge Planning: Goal: Ability to manage health-related needs will improve Outcome: Progressing   Problem: Clinical Measurements: Goal: Ability to maintain clinical measurements within normal limits will improve Outcome: Progressing Goal: Will remain free from infection Outcome: Progressing Goal: Diagnostic test results will improve Outcome: Progressing Goal: Respiratory complications will improve Outcome: Progressing Goal: Cardiovascular complication will  be avoided Outcome: Progressing   Problem: Activity: Goal: Risk for activity intolerance will decrease Outcome: Progressing   Problem: Nutrition: Goal: Adequate nutrition will be maintained Outcome: Progressing   Problem: Coping: Goal: Level of anxiety will decrease Outcome: Progressing   Problem: Elimination: Goal: Will not experience complications related to bowel motility Outcome: Progressing Goal: Will not experience complications related to urinary retention Outcome: Progressing   Problem: Pain Managment: Goal: General experience of comfort will improve and/or be controlled Outcome: Progressing   Problem: Safety: Goal: Ability to remain free from injury will improve Outcome: Progressing   Problem: Skin Integrity: Goal: Risk for impaired skin integrity will decrease Outcome: Progressing   Problem: Education: Goal: Understanding of CV disease, CV risk reduction, and recovery process will improve Outcome: Progressing Goal: Individualized Educational Video(s) Outcome: Progressing   Problem: Activity: Goal: Ability to return to baseline activity level will improve Outcome: Progressing   Problem: Cardiovascular: Goal: Ability to achieve and maintain adequate cardiovascular perfusion will improve Outcome: Progressing Goal: Vascular access site(s) Level 0-1 will be maintained Outcome: Progressing   Problem: Health Behavior/Discharge Planning: Goal: Ability to safely manage health-related needs after discharge will improve Outcome: Progressing   Problem: Education: Goal: Understanding of CV disease, CV risk reduction, and recovery process will improve Outcome: Progressing Goal: Individualized Educational Video(s) Outcome: Progressing   Problem: Activity: Goal: Ability to return to baseline activity level will improve Outcome: Progressing   Problem: Cardiovascular: Goal: Ability to achieve and maintain adequate cardiovascular perfusion will improve Outcome:  Progressing Goal: Vascular access site(s) Level 0-1 will be maintained Outcome: Progressing   Problem: Health Behavior/Discharge Planning: Goal: Ability to safely manage health-related needs after discharge will improve Outcome: Progressing

## 2024-02-16 NOTE — Progress Notes (Signed)
 NAME:  [name], MRN:  [id number], DOB:  June 20, 1956, LOS: 5 ADMISSION DATE:  02/11/2024, CONSULTATION DATE:  02/12/2024 REFERRING MD:  Arbie Knock CHIEF COMPLAINT:  hypotension   History of Present Illness:  68 year old male with PMH of COPD, insulin -dependent T2DM, GERD, CAD, PAD, HLD, HTN, OSA, ESRD on HD TTS, recent admission for Cdiff colitis who presented to the emergency department on 6/3 weakness and hypotension, decreased PO intake. He was reportedly at dialysis when he became hypotensive after 1L removed to 80s systolic. In ED, Na 133, Cl 93, BUN 41/sCr 6.26, AG 16, WBC 13.0, hgb 10.5, trop 44, lactic 2.4. In ED he received 1L IVF, midodrine  with improvement in his BP. He was admitted to hospitalist. Hospitalist gave albumin  25g x2.   Pertinent  Medical History  COPD, insulin -dependent T2DM, GERD, CAD, PAD, HLD, HTN, OSA, ESRD on HD TTS, recent admission for Cdiff colitis   Significant Hospital Events: Including procedures, antibiotic start and stop dates in addition to other pertinent events   6/3 admitted after presenting for hypotension 6/4 rapid response called for bradycardic episodes and hypotension. PCCM consulted.  6/5 pericardial drain; initial drainage 1280cc, 570cc out in subsequent <24 hrs. TVP placed. 6/8 TVP, pericardial drain d/c  Interim History / Subjective:  Had increased drainage from pericardial drain yesterday after attempted removal> left in the rest of the day.   Objective    Blood pressure (!) 138/93, pulse 78, temperature 98.2 F (36.8 C), temperature source Oral, resp. rate (!) 21, height 6\' 4"  (1.93 m), weight 110.5 kg, SpO2 98%. CVP:  [0 mmHg-29 mmHg] 6 mmHg      Intake/Output Summary (Last 24 hours) at 02/16/2024 0756 Last data filed at 02/16/2024 0700 Gross per 24 hour  Intake 2201.92 ml  Output 3809.8 ml  Net -1607.88 ml   Filed Weights   02/14/24 0500 02/15/24 0500 02/16/24 0500  Weight: 114.4 kg 111.5 kg 110.5 kg    Examination: General:  chronically ill appearing man lying in bed in NAD HEENT: [place]/AT, eyes anicteric Neuro: lethargic but arouses to stimulation, mumbling some. Answering some questions but not always accurate.  Chest: breathing comfortably on RA, no wheezing or rhonchi Heart: S1S2, reg rate & rhythm, not requiring pacing,  Abdomen: obese, soft, NT Extremities:  BLE BKA GU: external catheter      Pericardial fluid: 6750 WBC, 88% PMNs, 6% lymphs  Coox 78% BUN 22, Cr 1.91 on CRRT WBC 11.3 H/H 8.7/27.4 Platelets 183 Glucose 200> 100s Blood cultures NGTD Pericardial fluid: few PMN, no organisms> NG, final Pericardial fluid cytology: no malignant cells, erythrocytes and mixed inflammatory cells   Resolved Hospital Problem list     Assessment & Plan:  Combined shock-  septic due to right lower lobe pneumonia, obstructive shock, due to large pericardial effusion with cardiac tamponade> relieved by pericardial drain Lactic acidosis due to shock -con't holding apixaban  with bleeding previously; defer timing of restarting to cardiolgoy -con't plavix  with recent stents -con't empiric vanc & cefepime  -Etiology of pericarditis and effusion unknown-- suspect HD, but came in with ok BUN. Possibly serositis from hydralazine - DILE? ANA pending. -midodrine  10mg  TID  Symptomatic bradycardia in the setting of AV nodal blocking agents with beta-blocker and amiodarone ; s/p TVP Rate controlled Afib Coronary Artery Disease s/p stent to ostial/proximal RCA and mid RCA, PCI proximal RCA 11/2023 Paroxysmal Atrial Fibrillation , currently rate controlled  -TVP and pericardial drain d/c today -statin, plavix , holding aspirin   -holding amioarone and Bblocker for now -con't  holding PTA antihypertensives -weaning off pressors today- NE & vaso. Able to come off both this afternoon.  Recent C. Diff Colitis- during last admission, he was on vancomycin  which was supposed to be completed on 6/4. -oral vanc prophylactically with  need for ongoing antibiotics; plan for 7 days (#5) -cefepime  chosen due to lower c diff risk  Acute respiratory failure with hypoxia Right lower lobe pneumonia Bilateral pleural effusions> likely 2/2 ESRD. L>R, both dependent. -weaned off oxygen  -pulmonary hygiene -volume management with HD; coming off CRRT today and transition back to iHD this week -CXR AM to follow up effusion  COPD, not acutely exacerbated -con't yupelri  and brovana   ESRD  Hyperkalemia; resolved -CRRT> transitioning off back to iHD this week -renally dose meds, avoid nephrotoxic meds  DMII with uncontrolled hyperglycemia -increase glargine to 7 units BID -SSI PRN -inrease aspart to 3 units q4h TF coverage with increase in TF rate> d/c when he d/c his cortrak -goal BG 140-180  Concern for dysphagia -SLP consulted- ok for liquid diet; ADAT per SLP recs -con't TF via cortrak-- can increase to goal per protocol   PAD -statin, plavix , holding aspirin  until bleeding into pericardium seems low risk  Anemia due to ESRD; 3g drop in Hb in the past few days likely due to hemorrhagic pericarditis -transfuse for Hb <7 or hemodynamically significant bleeding -monitor  Moderate protein energy malnutrition; self discontinued his cortrak today -cortrak may need to be replaced tomorrow if not eating enough  Prolonged Qtc (even corrected for BBB) -tele monitoring -avoid QT prolonging meds  GG pulmonary nodule RUL; incidental finding  -needs OP follow up CT in 6-12 months  Best Practice (right click and "Reselect all SmartList Selections" daily)   Diet/type: tubefeeds DVT prophylaxis: SCD; AC on hold Pressure ulcer(s). Clinically undetermined if the pressure ulcer was present on admission, see nursing notes GI prophylaxis: N/A Lines: Central line and Arterial Line Foley:  N/A Code Status:  full code Last date of multidisciplinary goals of care discussion [Discussed with patient, he wishes to be full  code]  Labs   CBC: Recent Labs  Lab 02/11/24 1445 02/12/24 0522 02/13/24 1537 02/13/24 1550 02/14/24 0440 02/14/24 1202 02/15/24 0330 02/15/24 1145 02/16/24 0429  WBC 13.0*   < > 30.0*  --  22.3* 20.3* 16.0*  --  11.3*  NEUTROABS 10.6*  --   --   --   --   --   --   --   --   HGB 10.5*   < > 9.6*   < > 9.2* 8.9* 8.5* 8.6* 8.7*  HCT 34.2*   < > 30.5*   < > 29.2* 28.3* 27.3* 27.0* 27.4*  MCV 94.0   < > 92.7  --  92.7 93.1 92.9  --  93.2  PLT 278   < > 331  --  311 284 245  --  183   < > = values in this interval not displayed.    Basic Metabolic Panel: Recent Labs  Lab 02/12/24 1638 02/12/24 1651 02/13/24 0518 02/13/24 0840 02/14/24 0440 02/14/24 1536 02/15/24 0330 02/15/24 1609 02/16/24 0429  NA 134*   < > 135   < > 136 137 135 135 136  K 5.7*   < > 5.4*   < > 4.3 4.2 3.9 3.9 3.9  CL 93*  --  97*   < > 98 100 102 102 103  CO2 18*  --  19*   < > 24 22 24  25  24  GLUCOSE 201*  --  187*   < > 181* 182* 242* 245* 191*  BUN 61*  --  45*   < > 34* 29* 28* 26* 22  CREATININE 7.85*  --  5.23*   < > 3.53* 2.96* 2.60* 2.24* 1.91*  CALCIUM  8.8*  --  9.1   < > 8.6* 8.7* 8.9 8.7* 8.5*  MG 2.1  --  2.5*  --  2.1  --  2.0  --  2.2  PHOS  --   --  6.6*   < > 4.2 3.4 2.5 1.9* 2.3*   < > = values in this interval not displayed.   GFR: Estimated Creatinine Clearance: 49.7 mL/min (A) (by C-G formula based on SCr of 1.91 mg/dL (H)). Recent Labs  Lab 02/12/24 1709 02/13/24 0518 02/13/24 0844 02/13/24 1149 02/13/24 1537 02/13/24 1544 02/14/24 0440 02/14/24 1202 02/15/24 0330 02/16/24 0429  WBC  --    < >  --   --    < >  --  22.3* 20.3* 16.0* 11.3*  LATICACIDVEN 2.4*  --  12.4* 13.4*  --  5.8*  --   --   --   --    < > = values in this interval not displayed.    This patient is critically ill with multiple organ system failure which requires frequent high complexity decision making, assessment, support, evaluation, and titration of therapies. This was completed through the  application of advanced monitoring technologies and extensive interpretation of multiple databases. During this encounter critical care time was devoted to patient care services described in this note for 39 minutes.  Joesph Mussel, DO 02/16/24 5:32 PM [facility]  For contact information, see Amion. If no response to pager, please call PCCM consult pager. After hours, 7PM- 7AM, please call Elink.

## 2024-02-16 NOTE — Progress Notes (Addendum)
 [facility] Progress Note  Subjective:  I/O net neg 0.8 L yesterday I/O net eng 820 cc today so far Levo gtt down from 15-20 yest to 2- 6 micrograms/min today Wt's are down   Vitals:   02/16/24 0600 02/16/24 0700 02/16/24 0750 02/16/24 0854  BP:      Pulse: 76 78    Resp: 17 (!) 21    Temp:   98.2 F (36.8 C)   TempSrc:   Oral   SpO2: 100% 98%  100%  Weight:      Height:        Exam: Gen lethargic, on room air today No jvd or bruits Chest clear bilat to bases RRR no MRG Abd soft ntnd no mass or ascites +bs Ext 1+ bilat pitting LE edema Neuro is as above     LUA AVF+bruit      Renal-related home meds: Renvela  2 AC 3 times daily Others: Eliquis , Lipitor, MVI, Plavix , Pepcid , Requip , Flomax , p.o. vancomycin       OP HD: [facility] 4h  B450   107kg   AVF  Heparin  none Last OP HD 5/31, post wt 112.5kg Partial HD 6/03, post wt 116.5kg (up 9kg), 1.5hr session, sent to ed Hectorol  2 mcg iv three times per week   Assessment/ Plan: Pericardial effusion/ tamponade: s/p pericardiocentesis 6/05 per cardiology Shock: off levo, and off vaso gtt this afternoon AMS: remains confused ESRD: usual HD TTS. Started CRRT on 6/05 due to hypotension. Today is off pressors. Will dc CRRT. Plan iHD on 6/10.  Volume: h/o chronic vol issues, net neg 2.4 L since admit. 3kg over today Anemia of esrd: Hb 9-11  here, follow.   Secondary hyperparathyroidism: CCa in range, phos low d/t CRRT. Holding binders.  PAF: on eliquis  CAD: sp PCI w/ DES in march 2025          Rob Shasha Buchbinder MD  [facility] 02/16/2024, 9:06 AM  Recent Labs  Lab 02/15/24 1145 02/15/24 1609 02/16/24 0429  HGB 8.6*  --  8.7*  ALBUMIN   --  2.6* 2.7*  CALCIUM   --  8.7* 8.5*  PHOS  --  1.9* 2.3*  CREATININE  --  2.24* 1.91*  K  --  3.9 3.9   No results for input(s): "IRON", "TIBC", "FERRITIN" in the last 168 hours. Inpatient medications:  arformoterol   15 mcg Nebulization BID   atorvastatin   80 mg  Oral QHS   Chlorhexidine  Gluconate Cloth  6 each Topical Q0600   Chlorhexidine  Gluconate Cloth  6 each Topical Daily   clopidogrel   75 mg Oral Q breakfast   feeding supplement (PROSource TF20)  60 mL Per Tube BID   insulin  aspart  0-6 Units Subcutaneous Q4H   insulin  aspart  2 Units Subcutaneous Q4H   insulin  glargine-yfgn  7 Units Subcutaneous BID   midodrine   10 mg Oral Q8H   multivitamin  1 tablet Oral QHS   mupirocin  ointment  1 Application Nasal BID   mouth rinse  15 mL Mouth Rinse 4 times per day   revefenacin   175 mcg Nebulization Daily   rOPINIRole   2 mg Oral QHS   sevelamer  carbonate  1,600 mg Oral TID WC   thiamine   100 mg Per Tube Daily   vancomycin   125 mg Oral Daily    ceFEPime  (MAXIPIME ) IV Stopped (02/15/24 2254)   feeding supplement (VITAL 1.5 CAL) 30 mL/hr at 02/16/24 0800   norepinephrine  (LEVOPHED ) Adult infusion Stopped (02/16/24 1610)   prismasol  BGK  4/2.5 400 mL/hr at 02/16/24 0331   prismasol  BGK 4/2.5 400 mL/hr at 02/14/24 1154   prismasol  BGK 4/2.5 1,500 mL/hr at 02/16/24 0052   vancomycin  Stopped (02/15/24 2013)   vasopressin  0.03 Units/min (02/16/24 0800)   acetaminophen , atropine , heparin , oxyCODONE -acetaminophen  **OR** oxyCODONE -acetaminophen  **OR** HYDROmorphone  (DILAUDID ) injection **OR** HYDROmorphone  (DILAUDID ) injection, ipratropium-albuterol , melatonin, mouth rinse, polyethylene glycol, prochlorperazine 

## 2024-02-16 NOTE — Progress Notes (Signed)
 eLink Physician-Brief Progress Note Patient Name: [name] DOB: 1956/09/08 MRN: [id number]   Date of Service  02/16/2024  HPI/Events of Note  Patient given scheduled Melatonin that is ineffective, wide awake. Progressively more confused. On CRRT  eICU Interventions  Resumed Gabapentin  100 mg q HS. He takes this 3 times a day. Will monitor response to tonight's dose before titrating up.     Intervention Category Intermediate Interventions: Other:  [name] Gains 02/16/2024, 11:59 PM

## 2024-02-16 NOTE — Progress Notes (Signed)
 Patient ID: [name], male   DOB: Aug 21, 1956, 68 y.o.   MRN: [id number]     Advanced Heart Failure Rounding Note  Cardiologist: Knox Perl, MD  Chief Complaint: Shock Subjective:    Removed pericardial drain with minimal resistance, TVP removed as well. Off NE, still on CRRT. Remains confused, unclear baseline.    Objective:   Weight Range: 110.5 kg Body mass index is 29.65 kg/m.   Vital Signs:   Temp:  [96.8 F (36 C)-98.2 F (36.8 C)] 98.1 F (36.7 C) (06/08 1127) Pulse Rate:  [61-86] 80 (06/08 1115) Resp:  [11-27] 26 (06/08 1115) SpO2:  [96 %-100 %] 99 % (06/08 1115) Arterial Line BP: (101-255)/(31-250) 122/41 (06/08 1115) Weight:  [110.5 kg] 110.5 kg (06/08 0500) Last BM Date : 02/15/24  Weight change: Filed Weights   02/14/24 0500 02/15/24 0500 02/16/24 0500  Weight: 114.4 kg 111.5 kg 110.5 kg    Intake/Output:   Intake/Output Summary (Last 24 hours) at 02/16/2024 1419 Last data filed at 02/16/2024 1300 Gross per 24 hour  Intake 1957.85 ml  Output 3602.3 ml  Net -1644.45 ml      Physical Exam    General: Chronically ill-appearing Lungs: Normal work of breathing CV: regular rate and rhythm, systolic murmur Abdomen: Soft, nontender, no hepatosplenomegaly, no distention.  Skin: Intact without lesions or rashes.  Neurologic: Alert and oriented to person.  Psych: Normal affect. Extremities: Bilateral BKAs  Telemetry   Atrial fibrillation  Labs    CBC Recent Labs    02/15/24 0330 02/15/24 1145 02/16/24 0429  WBC 16.0*  --  11.3*  HGB 8.5* 8.6* 8.7*  HCT 27.3* 27.0* 27.4*  MCV 92.9  --  93.2  PLT 245  --  183   Basic Metabolic Panel Recent Labs    40/98/11 0330 02/15/24 1609 02/16/24 0429  NA 135 135 136  K 3.9 3.9 3.9  CL 102 102 103  CO2 24 25 24   GLUCOSE 242* 245* 191*  BUN 28* 26* 22  CREATININE 2.60* 2.24* 1.91*  CALCIUM  8.9 8.7* 8.5*  MG 2.0  --  2.2  PHOS 2.5 1.9* 2.3*   Liver Function Tests Recent Labs     02/15/24 1609 02/16/24 0429  ALBUMIN  2.6* 2.7*      Medications:     Scheduled Medications:  arformoterol   15 mcg Nebulization BID   atorvastatin   80 mg Oral QHS   Chlorhexidine  Gluconate Cloth  6 each Topical Q0600   Chlorhexidine  Gluconate Cloth  6 each Topical Daily   clopidogrel   75 mg Oral Q breakfast   feeding supplement (PROSource TF20)  60 mL Per Tube BID   insulin  aspart  0-6 Units Subcutaneous Q4H   insulin  aspart  2 Units Subcutaneous Q4H   insulin  glargine-yfgn  7 Units Subcutaneous BID   midodrine   10 mg Oral Q8H   multivitamin  1 tablet Oral QHS   mupirocin  ointment  1 Application Nasal BID   mouth rinse  15 mL Mouth Rinse 4 times per day   revefenacin   175 mcg Nebulization Daily   rOPINIRole   2 mg Oral QHS   sevelamer  carbonate  1,600 mg Oral TID WC   thiamine   100 mg Per Tube Daily   vancomycin   125 mg Oral Daily   vancomycin  variable dose per unstable renal function (pharmacist dosing)   Does not apply See admin instructions    Infusions:  [START ON 02/17/2024] ceFEPime  (MAXIPIME ) IV     feeding supplement (VITAL  1.5 CAL) 40 mL/hr at 02/16/24 1300   norepinephrine  (LEVOPHED ) Adult infusion Stopped (02/16/24 4098)   vasopressin  Stopped (02/16/24 1112)    PRN Medications: acetaminophen , atropine , oxyCODONE -acetaminophen  **OR** oxyCODONE -acetaminophen  **OR** HYDROmorphone  (DILAUDID ) injection **OR** HYDROmorphone  (DILAUDID ) injection, ipratropium-albuterol , melatonin, mouth rinse, polyethylene glycol, prochlorperazine     Assessment/Plan   1. Pericardial effusion: On 6/5 echo there was a large pericardial effusion with tamponade, pericardiocentesis with 1280 cc bloody fluid out.  Drainage slowed 6/7 - Pericardial drain removed 6/8 - Limited echo tomorrow - Holding heparin  gtt, if echo stable could consider removing  2. Bradycardia: Patient was on Coreg  and amiodarone  at home.  Noted to have long pauses up to 6 seconds on 6/4.  TVP placed but has not  required. Removed 6/8.   - Off nodal blockers.  - Extremely poor candidate for device therapy   3. Shock: Hypotensive with HD prior to admission, improving slowly, now coming off vaso. - Add midodrine  10 mg tid.  - Aim for SBP > 100 given wide pulse pressure (no AI on echo) with pressor wean.   4. ID: WBCs now down to 11, CXR with possible LLL PNA.  Afebrile.   - Broad spectrum coverage with vancomycin /cefepime    5. C difficile diarrhea: Diarrhea resolved.  Has been completing course of po vancomycin .   6. ESRD: CVVH currently, hopefully transition to iHD soon - Appreciate nephrology  7. Atrial fibrillation: Now back in NSR.  - No nodal blockers with recent bradycardia.  - Heparin  gtt on hold with falling hgb and ongoing bloody pericardial drain output.  Could consider resuming tomorrow  8. CAD: NSTEMI 3/25 s/p PCI/DES LCX, PTCA/shockwave lithotripsy p RCA. No chest pain.  - Continue Plavix .  Has not been on ASA with anticoagulation.  - Continue atorvastatin .   9. Anemia: Hgb falling, 8.5 today.  CRITICAL CARE Performed by: Lauralee Poll  Total critical care time: 35 minutes  Critical care time was exclusive of separately billable procedures and treating other patients.  Critical care was necessary to treat or prevent imminent or life-threatening deterioration.  Critical care was time spent personally by me on the following activities: development of treatment plan with patient and/or surrogate as well as nursing, discussions with consultants, evaluation of patient's response to treatment, examination of patient, obtaining history from patient or surrogate, ordering and performing treatments and interventions, ordering and review of laboratory studies, ordering and review of radiographic studies, pulse oximetry and re-evaluation of patient's condition.   Length of Stay: 5  Lauralee Poll, MD  02/16/2024, 2:19 PM  Advanced Heart Failure Team Pager [phone] (M-F; 7a -  5p)  Please contact [facility] for night-coverage after hours (5p -7a ) and weekends on [email or web address]

## 2024-02-17 ENCOUNTER — Inpatient Hospital Stay (HOSPITAL_COMMUNITY)

## 2024-02-17 DIAGNOSIS — I3139 Other pericardial effusion (noninflammatory): Secondary | ICD-10-CM

## 2024-02-17 LAB — BASIC METABOLIC PANEL WITH GFR
Anion gap: 9 (ref 5–15)
BUN: 29 mg/dL — ABNORMAL HIGH (ref 8–23)
CO2: 22 mmol/L (ref 22–32)
Calcium: 7.8 mg/dL — ABNORMAL LOW (ref 8.9–10.3)
Chloride: 104 mmol/L (ref 98–111)
Creatinine, Ser: 2.81 mg/dL — ABNORMAL HIGH (ref 0.61–1.24)
GFR, Estimated: 24 mL/min — ABNORMAL LOW (ref >60)
Glucose, Bld: 143 mg/dL — ABNORMAL HIGH (ref 70–99)
Potassium: 3.8 mmol/L (ref 3.5–5.1)
Sodium: 135 mmol/L (ref 135–145)

## 2024-02-17 LAB — ENA+DNA/DS+ANTICH+CENTRO+JO...
Anti JO-1: <0.2 AI (ref 0.0–0.9)
Centromere Ab Screen: <0.2 AI (ref 0.0–0.9)
Chromatin Ab SerPl-aCnc: <0.2 AI (ref 0.0–0.9)
ENA SM Ab Ser-aCnc: <0.2 AI (ref 0.0–0.9)
Ribonucleic Protein: 0.6 AI (ref 0.0–0.9)
SSA (Ro) (ENA) Antibody, IgG: 0.2 AI (ref 0.0–0.9)
SSB (La) (ENA) Antibody, IgG: <0.2 AI (ref 0.0–0.9)
Scleroderma (Scl-70) (ENA) Antibody, IgG: <0.2 AI (ref 0.0–0.9)
ds DNA Ab: <1 [IU]/mL (ref 0–9)

## 2024-02-17 LAB — COOXEMETRY PANEL
Carboxyhemoglobin: 1.8 % — ABNORMAL HIGH (ref 0.5–1.5)
Methemoglobin: <0.7 % (ref 0.0–1.5)
O2 Saturation: 84.6 %
Total hemoglobin: 9 g/dL — ABNORMAL LOW (ref 12.0–16.0)

## 2024-02-17 LAB — CBC
HCT: 28.3 % — ABNORMAL LOW (ref 39.0–52.0)
Hemoglobin: 8.9 g/dL — ABNORMAL LOW (ref 13.0–17.0)
MCH: 29.6 pg (ref 26.0–34.0)
MCHC: 31.4 g/dL (ref 30.0–36.0)
MCV: 94 fL (ref 80.0–100.0)
Platelets: 145 10*3/uL — ABNORMAL LOW (ref 150–400)
RBC: 3.01 MIL/uL — ABNORMAL LOW (ref 4.22–5.81)
RDW: 17.3 % — ABNORMAL HIGH (ref 11.5–15.5)
WBC: 7.8 10*3/uL (ref 4.0–10.5)
nRBC: 0 % (ref 0.0–0.2)

## 2024-02-17 LAB — GLUCOSE, CAPILLARY
Glucose-Capillary: 129 mg/dL — ABNORMAL HIGH (ref 70–99)
Glucose-Capillary: 134 mg/dL — ABNORMAL HIGH (ref 70–99)
Glucose-Capillary: 144 mg/dL — ABNORMAL HIGH (ref 70–99)
Glucose-Capillary: 153 mg/dL — ABNORMAL HIGH (ref 70–99)
Glucose-Capillary: 156 mg/dL — ABNORMAL HIGH (ref 70–99)
Glucose-Capillary: 160 mg/dL — ABNORMAL HIGH (ref 70–99)

## 2024-02-17 LAB — ECHOCARDIOGRAM LIMITED
Height: 76 in
Weight: 3897.73 [oz_av]

## 2024-02-17 LAB — ANA W/REFLEX IF POSITIVE: Anti Nuclear Antibody (ANA): POSITIVE — AB

## 2024-02-17 LAB — VANCOMYCIN, RANDOM: Vancomycin Rm: 20 ug/mL

## 2024-02-17 MED ORDER — CLOPIDOGREL BISULFATE 75 MG PO TABS
75.0000 mg | ORAL_TABLET | Freq: Every day | ORAL | Status: DC
  Administered 2024-02-18 – 2024-02-21 (×4): 75 mg via ORAL
  Filled 2024-02-17 (×4): qty 1

## 2024-02-17 MED ORDER — ENSURE PLUS HIGH PROTEIN PO LIQD
237.0000 mL | Freq: Two times a day (BID) | ORAL | Status: DC
  Administered 2024-02-17: 237 mL via ORAL

## 2024-02-17 MED ORDER — VANCOMYCIN 50 MG/ML ORAL SOLUTION
125.0000 mg | Freq: Every day | ORAL | Status: DC
  Administered 2024-02-17 – 2024-02-21 (×4): 125 mg via ORAL
  Filled 2024-02-17 (×5): qty 2.5

## 2024-02-17 MED ORDER — CHLORHEXIDINE GLUCONATE CLOTH 2 % EX PADS
6.0000 | MEDICATED_PAD | Freq: Every day | CUTANEOUS | Status: DC
  Administered 2024-02-17 – 2024-02-18 (×2): 6 via TOPICAL

## 2024-02-17 MED ORDER — FUROSEMIDE 10 MG/ML IJ SOLN
160.0000 mg | Freq: Once | INTRAVENOUS | Status: AC
  Administered 2024-02-17: 160 mg via INTRAVENOUS
  Filled 2024-02-17: qty 2

## 2024-02-17 MED ORDER — COLCHICINE 0.3 MG HALF TABLET
0.3000 mg | ORAL_TABLET | Freq: Every day | ORAL | Status: DC
  Administered 2024-02-17 – 2024-02-21 (×4): 0.3 mg via ORAL
  Filled 2024-02-17 (×5): qty 1

## 2024-02-17 MED ORDER — THIAMINE MONONITRATE 100 MG PO TABS
100.0000 mg | ORAL_TABLET | Freq: Every day | ORAL | Status: DC
  Administered 2024-02-18 – 2024-02-21 (×3): 100 mg via ORAL
  Filled 2024-02-17 (×3): qty 1

## 2024-02-17 NOTE — Progress Notes (Signed)
 NAME:  [name], MRN:  [id number], DOB:  07-06-1956, LOS: 6 ADMISSION DATE:  02/11/2024, CONSULTATION DATE:  02/12/2024 REFERRING MD:  Arbie Knock CHIEF COMPLAINT:  hypotension   History of Present Illness:  68 year old male with PMH of COPD, insulin -dependent T2DM, GERD, CAD, PAD, HLD, HTN, OSA, ESRD on HD TTS, recent admission for Cdiff colitis who presented to the emergency department on 6/3 weakness and hypotension, decreased PO intake. He was reportedly at dialysis when he became hypotensive after 1L removed to 80s systolic. In ED, Na 133, Cl 93, BUN 41/sCr 6.26, AG 16, WBC 13.0, hgb 10.5, trop 44, lactic 2.4. In ED he received 1L IVF, midodrine  with improvement in his BP. He was admitted to hospitalist. Hospitalist gave albumin  25g x2.   Pertinent  Medical History  COPD, insulin -dependent T2DM, GERD, CAD, PAD, HLD, HTN, OSA, ESRD on HD TTS, recent admission for Cdiff colitis   Significant Hospital Events: Including procedures, antibiotic start and stop dates in addition to other pertinent events   6/3 admitted after presenting for hypotension 6/4 rapid response called for bradycardic episodes and hypotension. PCCM consulted.  6/5 pericardial drain; initial drainage 1280cc, 570cc out in subsequent <24 hrs. TVP placed. 6/8 TVP, pericardial drain d/c. Self-d/c cortrak.  Interim History / Subjective:  Confused overnight- restarted on gabapentin .  Eating well this morning.   Objective    Blood pressure (!) 138/93, pulse 75, temperature 97.8 F (36.6 C), temperature source Axillary, resp. rate (!) 24, height 6\' 4"  (1.93 m), weight 110.5 kg, SpO2 100%. CVP:  [2 mmHg-21 mmHg] 2 mmHg      Intake/Output Summary (Last 24 hours) at 02/17/2024 0956 Last data filed at 02/16/2024 1600 Gross per 24 hour  Intake 384.08 ml  Output 406 ml  Net -21.92 ml   Filed Weights   02/14/24 0500 02/15/24 0500 02/16/24 0500  Weight: 114.4 kg 111.5 kg 110.5 kg    Examination: General: chronically ill  appearing man lying in bed in NAD; off CRRT HEENT: [place]/AT, eyes anicteric Neuro: awake, not particularly interactive Chest: breathing comfortably on RA, reduced left basilar breath sounds, no rhonchi or wheezing Heart: S1S2, irreg rhtyhm, rate in 90s. Afib on tele.  Abdomen: obese, soft, NT Extremities:  BKA BLE, +edema     Pericardial fluid: 6750 WBC, 88% PMNs, 6% lymphs  Coox 85% BUN 29, Cr 2.81 on CRRT holiday WBC 7.8 H/H 8.9/28.3 Platelets 145 Glucose 100s Vanc random 20 CXR personally reviewed> layering pleural effusion on the R, smaller but persistent left sided effusion.  EKG: NSR, LAD, RBBB  Blood cultures NG final Pericardial fluid: few PMN, no organisms> NG, final Pericardial fluid cytology: no malignant cells, erythrocytes and mixed inflammatory cells   Resolved Hospital Problem list     Assessment & Plan:  Combined shock-  septic due to right lower lobe pneumonia, obstructive shock, due to large pericardial effusion with cardiac tamponade> relieved by pericardial drain.  Lactic acidosis due to shock -Apixaban  has been on hold due to hemorrhagic pericardial effusion; resume per AHF's recommendations> wait until tomorrow.  -con't plavix  with recent stents, holding aspirin  -complete 7 days of cefepime  & vanc, then stop -Etiology of pericarditis and effusion unknown-- suspect HD, but came in with ok BUN. Possibly serositis from hydralazine - DILE? 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  -con't midodrine  10mg  TID  Symptomatic bradycardia in the setting of AV nodal blocking agents with beta-blocker and amiodarone ; s/p TVP Rate controlled Afib Coronary Artery Disease s/p  stent to ostial/proximal RCA and mid RCA, PCI proximal RCA 11/2023 Paroxysmal atrial fibrillation , currently rate controlled  -con't plavix  and statin, holding aspirin  -holding amiodarone  and Bblocker; HR is slowly coming up with Afib but staying below 100 today. High  risk for recurrent HB and braycardia if he goes back on amiodarone . -con't holding PTA antihypertensives -off pressors today  Recent C. Diff Colitis- during last admission, he was on vancomycin  which was supposed to be completed on 6/4. -complete 7 days of cefepime  tomorrow -oral vanc to decrease risk of c diff recurrence  Acute respiratory failure with hypoxia Right lower lobe pneumonia Bilateral pleural effusions> likely 2/2 ESRD. L>R, both dependent. -pulmonary hygiene -ewaned off O2 -patient declined L thora today -volume management with HD  COPD, not acutely exacerbated -brovana  & yupelri   ESRD  Hyperkalemia; resolved -strict I/O -renally dose meds, avoid nephrotoxic meds -iHD tomorrow  DMII with uncontrolled hyperglycemia -glargine 7 units BID --SSI PRN -goal BG 140-180  Concern for dysphagia Moderate protein energy malnutrition; self discontinued his cortrak today -SLP consulted; ADAC -refused cortrak -encourage PO intake  PAD -holding aspirin  -statin, plavix   Anemia due to ESRD; 3g drop in Hb in the past few days likely due to hemorrhagic pericarditis -transfuse for Hb <7 or hemodynamically significant bleeding   Prolonged Qtc (even corrected for BBB) -avoid QT prolonging meds -tele monitoring -monitor electrolytes  GG pulmonary nodule RUL; incidental finding  -needs OP follow up CT in 6-12 months   Palliative care consulted - complex disease, baseline disability, recurrent hospitalizations.   Best Practice (right click and "Reselect all SmartList Selections" daily)   Diet/type: dysphagia diet (see orders) DVT prophylaxis: SCD; AC on hold Pressure ulcer(s). Clinically undetermined if the pressure ulcer was present on admission, see nursing notes GI prophylaxis: N/A Lines: Central line and Arterial Line Foley:  N/A Code Status:  full code Last date of multidisciplinary goals of care discussion [Discussed with patient, he wishes to be full  code]  Labs   CBC: Recent Labs  Lab 02/11/24 1445 02/12/24 0522 02/14/24 0440 02/14/24 1202 02/15/24 0330 02/15/24 1145 02/16/24 0429 02/17/24 0636  WBC 13.0*   < > 22.3* 20.3* 16.0*  --  11.3* 7.8  NEUTROABS 10.6*  --   --   --   --   --   --   --   HGB 10.5*   < > 9.2* 8.9* 8.5* 8.6* 8.7* 8.9*  HCT 34.2*   < > 29.2* 28.3* 27.3* 27.0* 27.4* 28.3*  MCV 94.0   < > 92.7 93.1 92.9  --  93.2 94.0  PLT 278   < > 311 284 245  --  183 145*   < > = values in this interval not displayed.    Basic Metabolic Panel: Recent Labs  Lab 02/12/24 1638 02/12/24 1651 02/13/24 0518 02/13/24 0840 02/14/24 0440 02/14/24 1536 02/15/24 0330 02/15/24 1609 02/16/24 0429 02/17/24 0636  NA 134*   < > 135   < > 136 137 135 135 136 135  K 5.7*   < > 5.4*   < > 4.3 4.2 3.9 3.9 3.9 3.8  CL 93*  --  97*   < > 98 100 102 102 103 104  CO2 18*  --  19*   < > 24 22 24 25 24 22   GLUCOSE 201*  --  187*   < > 181* 182* 242* 245* 191* 143*  BUN 61*  --  45*   < > 34*  29* 28* 26* 22 29*  CREATININE 7.85*  --  5.23*   < > 3.53* 2.96* 2.60* 2.24* 1.91* 2.81*  CALCIUM  8.8*  --  9.1   < > 8.6* 8.7* 8.9 8.7* 8.5* 7.8*  MG 2.1  --  2.5*  --  2.1  --  2.0  --  2.2  --   PHOS  --   --  6.6*   < > 4.2 3.4 2.5 1.9* 2.3*  --    < > = values in this interval not displayed.   GFR: Estimated Creatinine Clearance: 33.8 mL/min (A) (by C-G formula based on SCr of 2.81 mg/dL (H)). Recent Labs  Lab 02/12/24 1709 02/13/24 0518 02/13/24 0844 02/13/24 1149 02/13/24 1537 02/13/24 1544 02/14/24 0440 02/14/24 1202 02/15/24 0330 02/16/24 0429 02/17/24 0636  WBC  --    < >  --   --    < >  --    < > 20.3* 16.0* 11.3* 7.8  LATICACIDVEN 2.4*  --  12.4* 13.4*  --  5.8*  --   --   --   --   --    < > = values in this interval not displayed.     Joesph Mussel, DO 02/17/24 4:31 PM [facility]  For contact information, see Amion. If no response to pager, please call PCCM consult pager. After  hours, 7PM- 7AM, please call Elink.

## 2024-02-17 NOTE — Progress Notes (Addendum)
 Patient ID: LENA FIELDHOUSE, male   DOB: June 18, 1956, 68 y.o.   MRN: [id number]     Advanced Heart Failure Rounding Note  Cardiologist: Knox Perl, MD  Chief Complaint: Shock Subjective:    6/5: tamponade s/p pericardiocentesis; started on CRRT  6/8: TVP and pericardial drain removed  Co-ox 85%. CVP <5. Off pressors. Stopping CRRT, planning for iHD tomorrow.  Confused. WBC down to 7.8 today. On ABX. Hgb stable. CXR this morning with persistent opacities consistent with pulm edema. L sided effusion improving Echo today.   Confused this morning. Appears delirious. Oriented to self and place. No SOB or pain.   Objective:    Weight Range: 110.5 kg Body mass index is 29.65 kg/m.   Vital Signs:   Temp:  [97.8 F (36.6 C)-98.1 F (36.7 C)] 97.8 F (36.6 C) (06/09 0329) Pulse Rate:  [75-110] 83 (06/09 1000) Resp:  [14-29] 22 (06/09 1000) SpO2:  [98 %-100 %] 99 % (06/09 1000) Arterial Line BP: (104-139)/(39-63) 104/39 (06/09 1000) Last BM Date : 02/15/24  Weight change: Filed Weights   02/14/24 0500 02/15/24 0500 02/16/24 0500  Weight: 114.4 kg 111.5 kg 110.5 kg   Intake/Output:  Intake/Output Summary (Last 24 hours) at 02/17/2024 1016 Last data filed at 02/16/2024 1600 Gross per 24 hour  Intake 344.51 ml  Output 281 ml  Net 63.51 ml    Physical Exam   CVP 2 General: Chronically-ill appearing. No distress on RA Cardiac: JVP ~7cm. S1 and S2 present.  Abdomen: Obese, non-tender, non-distended.  Extremities: Warm and dry. Trace thigh edema. B/L BKAs Neuro: A/Ox2, confused to situation Lines/Devices:  R Aline, LIJ HDL, LUE AVF CVL   Telemetry   In and out of AF. Currently in AF in 80-90s (personally reviewed)  Labs    CBC Recent Labs    02/16/24 0429 02/17/24 0636  WBC 11.3* 7.8  HGB 8.7* 8.9*  HCT 27.4* 28.3*  MCV 93.2 94.0  PLT 183 145*   Basic Metabolic Panel Recent Labs    13/08/65 0330 02/15/24 1609 02/16/24 0429 02/17/24 0636  NA 135 135 136 135  K  3.9 3.9 3.9 3.8  CL 102 102 103 104  CO2 24 25 24 22   GLUCOSE 242* 245* 191* 143*  BUN 28* 26* 22 29*  CREATININE 2.60* 2.24* 1.91* 2.81*  CALCIUM  8.9 8.7* 8.5* 7.8*  MG 2.0  --  2.2  --   PHOS 2.5 1.9* 2.3*  --    Liver Function Tests Recent Labs    02/15/24 1609 02/16/24 0429  ALBUMIN  2.6* 2.7*   Medications:    Scheduled Medications:  arformoterol   15 mcg Nebulization BID   atorvastatin   80 mg Oral QHS   Chlorhexidine  Gluconate Cloth  6 each Topical Q0600   Chlorhexidine  Gluconate Cloth  6 each Topical Daily   [START ON 02/18/2024] clopidogrel   75 mg Oral Q breakfast   feeding supplement  237 mL Oral BID BM   gabapentin   100 mg Oral QHS   insulin  aspart  0-6 Units Subcutaneous Q4H   insulin  glargine-yfgn  7 Units Subcutaneous BID   midodrine   10 mg Oral Q8H   multivitamin  1 tablet Oral QHS   mupirocin  ointment  1 Application Nasal BID   mouth rinse  15 mL Mouth Rinse 4 times per day   revefenacin   175 mcg Nebulization Daily   rOPINIRole   2 mg Oral QHS   sevelamer  carbonate  1,600 mg Oral TID WC   [START ON  02/18/2024] thiamine   100 mg Oral Daily   vancomycin   125 mg Oral Daily   vancomycin  variable dose per unstable renal function (pharmacist dosing)   Does not apply See admin instructions   Infusions:  ceFEPime  (MAXIPIME ) IV     PRN Medications: acetaminophen , atropine , oxyCODONE -acetaminophen  **OR** oxyCODONE -acetaminophen  **OR** HYDROmorphone  (DILAUDID ) injection **OR** HYDROmorphone  (DILAUDID ) injection, ipratropium-albuterol , melatonin, mouth rinse, polyethylene glycol, prochlorperazine   Assessment/Plan   1. Pericardial effusion: On 6/5 echo there was a large pericardial effusion with tamponade, pericardiocentesis with 1280 cc bloody fluid out.  Drainage slowed 6/7. - Pericardial drain removed 6/8 - Off heparin  gtt - Limited echo today - pending results will discuss started back on heparin   2. Bradycardia: Patient was on Coreg  and amiodarone  at home. Noted  to have long pauses up to 6 seconds on 6/4.  TVP placed but has not required. Removed 6/8.   - Off nodal blockers.  - Extremely poor candidate for device therapy - AF with now rising rates, continue to follow. May need low dose rate controlling agent eventually, will hold off on now.    3. Shock: Hypotensive with HD prior to admission, improving slowly, now coming off vaso. - continue midodrine  10 mg tid - Aim for SBP > 100 given wide pulse pressure (no AI on echo) with pressor wean.   4. ID: WBCs now down to 7.8, CXR with possible LLL PNA.  Afebrile.   - Broad spectrum coverage with vancomycin /cefepime  - improving L effusion noted on CXR.    5. C difficile diarrhea: Diarrhea resolved.  On PO vanc. Mgmt per primary.  6. ESRD: Off CRRT, transition to [facility] tomorrow. Oliguric. - Appreciate nephrology - given persistent pulmonary edema, could consider diuretic agent on non-HD days.   7. Atrial fibrillation: In and out of AF overnight - No nodal blockers with recent bradycardia.  - Limited echo today. If no pericardial effusion, discuss starting back on heparin .   8. CAD: NSTEMI 3/25 s/p PCI/DES LCX, PTCA/shockwave lithotripsy p RCA. No chest pain.  - Continue Plavix .  Has not been on ASA with anticoagulation.  - Continue atorvastatin .   9. Anemia: Hgb fell to 8.5, now 8.9 today.  Length of Stay: 6  CRITICAL CARE Performed by: [place] Lee  Total critical care time: 13 minutes  -Critical care time was exclusive of separately billable procedures and treating other patients. -Critical care was necessary to treat or prevent imminent or life-threatening deterioration. -Critical care was time spent personally by me on the following activities: development of treatment plan with patient and/or surrogate as well as nursing, discussions with consultants, evaluation of patient's response to treatment, examination of patient, obtaining history from patient or surrogate, ordering and performing  treatments and interventions, ordering and review of laboratory studies, ordering and review of radiographic studies, pulse oximetry and re-evaluation of patient's condition.  [place] Lee, NP  02/17/2024, 10:16 AM  Advanced Heart Failure Team Pager [phone] (M-F; 7a - 5p)   Agree with above.   Pericardial drain pulled yesterday with some difficulty. Denies CP or SOB. Echo today with no effusion. EF 40-45% RV moderately down (apical sparing)  In/out of AF overnight with 6 second post conversion pauses  Confused at times   Being treated for persistent C. diff  General:  Chronically ill appearing. No resp difficulty HEENT: normal Neck: supple. JVP to jaw  Cor: Reg  Lungs: clear Abdomen: soft, nontender, nondistended. No hepatosplenomegaly. No bruits or masses. Good bowel sounds. Extremities: no cyanosis, clubbing, rash, 2-3+ edema  Neuro: conversant but confused  Remains tenuous.   Etiology of pericardial effusion is unclear (? [place]). CT C/A/P no mass. Cytology negative. Will hold [facility] for now. (Continue Plavix  for recent stents)   In/out of AF with post-conversion pauses. He is very poor PPM candidate due to high risk of infection. May need to restart amio carefully.   Remains volume overload. Will give IV lasix  today prior to HD tomorrow.   Given overall condition would consider Palliative Care consultation.   CRITICAL CARE Performed by: Jules Oar  Total critical care time: 45 minutes  Critical care time was exclusive of separately billable procedures and treating other patients.  Critical care was necessary to treat or prevent imminent or life-threatening deterioration.  Critical care was time spent personally by me (independent of midlevel providers or residents) on the following activities: development of treatment plan with patient and/or surrogate as well as nursing, discussions with consultants, evaluation of patient's response to treatment, examination of  patient, obtaining history from patient or surrogate, ordering and performing treatments and interventions, ordering and review of laboratory studies, ordering and review of radiographic studies, pulse oximetry and re-evaluation of patient's condition.  Jules Oar, MD  3:26 PM

## 2024-02-17 NOTE — Progress Notes (Signed)
 Speech Language Pathology Treatment: Dysphagia  Patient Details Name: [name] MRN: [id number] DOB: Jan 10, 1956 Today's Date: 02/17/2024 Time: 1020-1030 SLP Time Calculation (min) (ACUTE ONLY): 10 min  Assessment / Plan / Recommendation Clinical Impression  Pt alert, still confused, requires some assistance for self feeding, but demonstrates ability to masticate and drink water  without concerns. Top denture in place. Resume regular diet and thin liquids. SLP will sign off.   HPI HPI: 68 year old male with recent admission for Cdiff colitis who presented to the emergency department on 6/3 weakness and hypotension, decreased PO intake. He was reportedly at dialysis when he became hypotensive after 1L removed to 80s systolic. Pt seen by SLP in prior years, once for a CT scan showing debris in airway. Clinical exams unrevealing other than reports of GER-like symptoms. PMH of COPD, insulin -dependent T2DM, GERD, CAD, PAD, HLD, HTN, OSA, ESRD on HD TTS,      SLP Plan  All goals met          Recommendations  Diet recommendations: Regular;Thin liquid Supervision: Staff to assist with self feeding                              All goals met     [name], [name]  02/17/2024, 10:46 AM

## 2024-02-17 NOTE — Progress Notes (Addendum)
 Pharmacy Antibiotic Note  [name] is a 68 y.o. male admitted on 02/11/2024 with sepsis.  Pharmacy has been consulted for vancomycin  dosing.  Hx ESRD (HD TTS) - was on CRRT from 6/5>6/8, plan to start iHD on 6/10 On PO vanc for CDIFF prophylaxis s/p full dose treatment course  WBC 7.8 WNL, afeb, on room air, VSS 6/5 MRSA PCR positive, Bcx and Peritoneal fld cx no growth  VR 20, therapeutic - plan for 7 days of treatment of HAP (last day 6/10) Level should remain the same until next HD   Plan: No need for additional vancomycin  doses due to ESRD and plan for next HD 6/10 Continue Cefepime  2g IV every 24h, last dose 6/10 AM Monitor CBC, renal function, and continued improvement once off antibiotics   Height: 6\' 4"  (193 cm) Weight: 110.5 kg (243 lb 9.7 oz) IBW/kg (Calculated) : 86.8  Temp (24hrs), Avg:97.9 F (36.6 C), Min:97.8 F (36.6 C), Max:98.1 F (36.7 C)  Recent Labs  Lab 02/12/24 1343 02/12/24 1638 02/12/24 1709 02/13/24 0518 02/13/24 0844 02/13/24 1149 02/13/24 1537 02/13/24 1544 02/14/24 0440 02/14/24 1202 02/14/24 1536 02/15/24 0330 02/15/24 1609 02/16/24 0429 02/17/24 0609 02/17/24 0636  WBC  --    < >  --    < >  --   --    < >  --  22.3* 20.3*  --  16.0*  --  11.3*  --  7.8  CREATININE  --    < >  --    < >  --   --    < >  --  3.53*  --  2.96* 2.60* 2.24* 1.91*  --  2.81*  LATICACIDVEN 5.5*  --  2.4*  --  12.4* 13.4*  --  5.8*  --   --   --   --   --   --   --   --   VANCORANDOM  --   --   --   --   --   --   --   --   --   --   --   --   --   --  20  --    < > = values in this interval not displayed.    Estimated Creatinine Clearance: 33.8 mL/min (A) (by C-G formula based on SCr of 2.81 mg/dL (H)).    Allergies  Allergen Reactions   Contrast Media [Iodinated Contrast Media] Shortness Of Breath and Other (See Comments)    Difficulty breathing and altered mental status     Ivp Dye [Iodinated Contrast Media] Anaphylaxis, Shortness Of Breath and  Other (See Comments)    Breathing problems, altered mental state    Latex Rash and Other (See Comments)    A severe rash appears after the first 24 hours of being placed   Tape Other (See Comments) and Rash    Rash after 1 day of use  Do not leave on longer then 3 days with out be changed    Antimicrobials this admission: PO vanco 5/24 > ppx dosing  for recurrent C diff6/4>> Vanc 6/4 > [6/10] Zosyn  6/4> 6/5  Cefepime  6/5 > [6/10]  Microbiology results: 6/3 BCx: no growth final 6/5 MRSA: positive 6/5 Peritoneal fld cx: no growth final 6/4 GAS negative  Thank you for allowing pharmacy to participate in this patient's care,  Jerrel Mor, PharmD PGY1 Pharmacy Resident 02/17/2024 1:07 PM  Please check AMION for all [facility] Pharmacy phone numbers After  10:00 PM, call Main Pharmacy [phone]

## 2024-02-17 NOTE — Progress Notes (Signed)
 [facility] NEPHROLOGY PROGRESS NOTE  Assessment/ Plan: Pt is a 68 y.o. yo male   OP HD: [facility] TTS 4h  B450   107kg   AVF  Heparin  none Last OP HD 5/31, post wt 112.5kg Partial HD 6/03, post wt 116.5kg (up 9kg), 1.5hr session, sent to ed Hectorol  2 mcg iv three times per week  # Pericardial effusion/ tamponade: s/p pericardiocentesis 6/05 per cardiology.  # Shock: off levo and vaso.  On midodrine  now, monitor BP.   # ESRD: usual HD TTS. CRRT 6/5-6/8. Plan iHD on 6/10.   # Anemia of esrd: Hb 9-11  here, follow.   # Secondary hyperparathyroidism: CCa in range, phos low d/t CRRT.  Currently on sevelamer  as he started eating.   #PAF: on eliquis  #CAD: sp PCI w/ DES in march 2025   Subjective: Seen and examined.  No new event.  No nausea, vomiting, chest pain or shortness of breath. Objective Vital signs in last 24 hours: Vitals:   02/17/24 0830 02/17/24 0845 02/17/24 0900 02/17/24 1000  BP:      Pulse: 79 75 78 83  Resp: (!) 22 (!) 24 (!) 21 (!) 22  Temp:      TempSrc:      SpO2: 99% 100% 99% 99%  Weight:      Height:       Weight change:   Intake/Output Summary (Last 24 hours) at 02/17/2024 1054 Last data filed at 02/16/2024 1600 Gross per 24 hour  Intake 344.51 ml  Output 281 ml  Net 63.51 ml       Labs: RENAL PANEL Recent Labs  Lab 02/12/24 1638 02/12/24 1651 02/13/24 0518 02/13/24 0840 02/14/24 0440 02/14/24 1536 02/15/24 0330 02/15/24 1609 02/16/24 0429 02/17/24 0636  NA 134*   < > 135   < > 136 137 135 135 136 135  K 5.7*   < > 5.4*   < > 4.3 4.2 3.9 3.9 3.9 3.8  CL 93*  --  97*   < > 98 100 102 102 103 104  CO2 18*  --  19*   < > 24 22 24 25 24 22   GLUCOSE 201*  --  187*   < > 181* 182* 242* 245* 191* 143*  BUN 61*  --  45*   < > 34* 29* 28* 26* 22 29*  CREATININE 7.85*  --  5.23*   < > 3.53* 2.96* 2.60* 2.24* 1.91* 2.81*  CALCIUM  8.8*  --  9.1   < > 8.6* 8.7* 8.9 8.7* 8.5* 7.8*  MG 2.1  --  2.5*  --  2.1  --  2.0  --  2.2   --   PHOS  --   --  6.6*   < > 4.2 3.4 2.5 1.9* 2.3*  --   ALBUMIN   --   --  3.4*   < > 2.8* 2.6* 2.5* 2.6* 2.7*  --    < > = values in this interval not displayed.    Liver Function Tests: Recent Labs  Lab 02/11/24 1445 02/13/24 0518 02/15/24 0330 02/15/24 1609 02/16/24 0429  AST 22  --   --   --   --   ALT 17  --   --   --   --   ALKPHOS 82  --   --   --   --   BILITOT 0.8  --   --   --   --   PROT 5.6*  --   --   --   --  ALBUMIN  2.6*   < > 2.5* 2.6* 2.7*   < > = values in this interval not displayed.   No results for input(s): "LIPASE", "AMYLASE" in the last 168 hours. No results for input(s): "AMMONIA" in the last 168 hours. CBC: Recent Labs    03/30/23 1922 03/31/23 0314 05/28/23 1748 05/29/23 0419 02/14/24 1202 02/15/24 0330 02/15/24 1145 02/16/24 0429 02/17/24 0636  HGB  --    < >  --    < > 8.9* 8.5* 8.6* 8.7* 8.9*  MCV  --    < >  --    < > 93.1 92.9  --  93.2 94.0  VITAMINB12 432  --  411  --   --   --   --   --   --   FOLATE >40.0  --   --   --   --   --   --   --   --   FERRITIN 939*  --   --   --   --   --   --   --   --   TIBC 224*  --   --   --   --   --   --   --   --   IRON 81  --   --   --   --   --   --   --   --   RETICCTPCT 1.3  --   --   --   --   --   --   --   --    < > = values in this interval not displayed.    Cardiac Enzymes: No results for input(s): "CKTOTAL", "CKMB", "CKMBINDEX", "TROPONINI" in the last 168 hours. CBG: Recent Labs  Lab 02/16/24 1551 02/16/24 1927 02/16/24 2311 02/17/24 0329 02/17/24 0738  GLUCAP 146* 117* 162* 144* 134*    Iron Studies: No results for input(s): "IRON", "TIBC", "TRANSFERRIN", "FERRITIN" in the last 72 hours. Studies/Results: DG CHEST PORT 1 VIEW Result Date: 02/17/2024 CLINICAL DATA:  Pleural effusion EXAM: PORTABLE CHEST 1 VIEW COMPARISON:  Chest x-ray performed February 13, 2024 FINDINGS: Left-sided approach vascular catheter with tip overlying the SVC. Small bilateral pleural effusions. Heart  is enlarged. No pneumothorax. Central pulmonary vascular congestion. IMPRESSION: 1. Modest improvement in small left effusion with associated consolidation. 2. Trace right pleural effusion. 3. Central pulmonary vascular congestion. Electronically Signed   By: Reagan Camera M.D.   On: 02/17/2024 08:29    Medications: Infusions:  ceFEPime  (MAXIPIME ) IV      Scheduled Medications:  arformoterol   15 mcg Nebulization BID   atorvastatin   80 mg Oral QHS   Chlorhexidine  Gluconate Cloth  6 each Topical Q0600   Chlorhexidine  Gluconate Cloth  6 each Topical Daily   [START ON 02/18/2024] clopidogrel   75 mg Oral Q breakfast   feeding supplement  237 mL Oral BID BM   gabapentin   100 mg Oral QHS   insulin  aspart  0-6 Units Subcutaneous Q4H   insulin  glargine-yfgn  7 Units Subcutaneous BID   midodrine   10 mg Oral Q8H   multivitamin  1 tablet Oral QHS   mupirocin  ointment  1 Application Nasal BID   mouth rinse  15 mL Mouth Rinse 4 times per day   revefenacin   175 mcg Nebulization Daily   rOPINIRole   2 mg Oral QHS   sevelamer  carbonate  1,600 mg Oral TID WC   [START ON 02/18/2024] thiamine   100 mg Oral Daily  vancomycin   125 mg Oral Daily   vancomycin  variable dose per unstable renal function (pharmacist dosing)   Does not apply See admin instructions    have reviewed scheduled and prn medications.  Physical Exam: General:NAD, comfortable Heart:RRR, s1s2 nl Lungs:clear b/l, no crackle Abdomen:soft, Non-tender, non-distended Extremities:No edema Dialysis Access: AV fistula.  [name] [name] 02/17/2024,10:54 AM  LOS: 6 days

## 2024-02-17 NOTE — Progress Notes (Signed)
 Nutrition Follow-up  DOCUMENTATION CODES:  Non-severe (moderate) malnutrition in context of chronic illness  INTERVENTION:  Initiate calorie count to assess need for replacement of Cortrak  Continue Thiamine  100 mg daily   Continue Renal MVI daily  Add Ensure Plus High Protein po BID, each supplement provides 350 kcal and 20 grams of protein.   Add Magic cup TID with meals, each supplement provides 290 kcal and 9 grams of protein   Assistance with meals to maximize intake    NUTRITION DIAGNOSIS:  Moderate Malnutrition related to chronic illness as evidenced by moderate fat depletion, moderate muscle depletion, edema. - remains applicable  GOAL:  Patient will meet greater than or equal to 90% of their needs - progressing  MONITOR:  Labs, Weight trends, Diet advancement, Skin, I & O's  REASON FOR ASSESSMENT:  Consult Assessment of nutrition requirement/status  ASSESSMENT:  68 yo male admitted with shock, combined septic with RLL pneuminia and cardiogenic with large pericardial effusion with cardiac tamponade. +lactic acidosis, metabolic acidosis.  PMH includes COPD, DM, GERD, CAD, PAD, HLD, HTN, OSA, ESRD on HD, recent C.diff colitis. Surgical hx includes bilateral BKA, colectomy with ileostomy (reversed)  6/03 Admitted to 5W 6/04 Rapid response due to hypotension and bradycardia 6/05 Worsening shock, ECHO: pericardial effusion with cardiac tamponade requiring pericardiocentesis with 1280 mL drained, drain left in place; CRRT started 6/06 Cortrak placed; trickle feeds initiated 6/08  pericardial drain and TVP removed; self discontinued Cortrak 6/09 advanced to regular diet  Remains somewhat confused. Pt off pressors and CRRT. Pt removed his own Cortrak yesterday. Will hold off on Cortrak replacement today as RN reports tolerance of breakfast meal with adequate intake. Will initiate calorie count to assess over next two days and replace Cortrak, if indicated, on Wednesday. MD  aware and agreeable.   Advanced to regular diet today. Previously unable to start solid diet due to lethargy and not being able to follow all commands.   Admit Weight (6/3): 113.4kg 6/5: 119kg Current Weight(6/8): 110.5kg EDW: 107kg   CRRT discontinued with next HD tx scheduled 6/10. Considering diuretic, given persistent pulmonary edema. Some generalized non-pitting edema continues.  Net IO Since Admission: -2,493.91 mL [02/17/24 1345]   Drains/Lines: LUE: AVF: +t/b LIJ: triple lumen non-tunneled dialysis catheter A-line: R brachial   Sodium improved and trending normal. WBC within desirable range. Blood sugars and Hgb stabilizing. Sevelamer  re-initiated now that diet resumed.   Labs:  Sodium 135 (wdl) Potassium 3.8 (wdl) BUN 22>29 (H) Creatinine 1.91>2.81 (H) Phosphorus 1.9>2.3(L) WBC 16.0>11.3>7.8 (wdl) CBGs 143-191 x24 hours A1c 5.4 (01/2024)   Meds:  SS novolog  Semglee  Rena-Vite Renvela   Thiamine  IV ABX    Diet Order:   Diet Order             Diet full liquid Room service appropriate? No; Fluid consistency: Thin  Diet effective now            EDUCATION NEEDS:  Not appropriate for education at this time  Skin:  Skin Assessment: Skin Integrity Issues: Skin Integrity Issues:: Stage I Stage I: R coccyx, L thigh  Last BM:  6/7 - type 2 x1  Height:  Ht Readings from Last 1 Encounters:  02/11/24 6\' 4"  (1.93 m)   Weight:  Wt Readings from Last 1 Encounters:  02/16/24 110.5 kg    BMI:  Body mass index is 29.65 kg/m.  Estimated Nutritional Needs:   Kcal:  2200-2400 kcals  Protein:  120-140 g  Fluid:  1000 mL plus UOP  Con Decant MS, RD, LDN Registered Dietitian Clinical Nutrition RD Inpatient Contact Info in Amion

## 2024-02-18 DIAGNOSIS — N186 End stage renal disease: Secondary | ICD-10-CM

## 2024-02-18 DIAGNOSIS — I4891 Unspecified atrial fibrillation: Secondary | ICD-10-CM

## 2024-02-18 DIAGNOSIS — I495 Sick sinus syndrome: Secondary | ICD-10-CM

## 2024-02-18 DIAGNOSIS — R5381 Other malaise: Secondary | ICD-10-CM

## 2024-02-18 DIAGNOSIS — R578 Other shock: Secondary | ICD-10-CM

## 2024-02-18 LAB — RENAL FUNCTION PANEL
Albumin: 2.4 g/dL — ABNORMAL LOW (ref 3.5–5.0)
Anion gap: 10 (ref 5–15)
BUN: 40 mg/dL — ABNORMAL HIGH (ref 8–23)
CO2: 22 mmol/L (ref 22–32)
Calcium: 8 mg/dL — ABNORMAL LOW (ref 8.9–10.3)
Chloride: 104 mmol/L (ref 98–111)
Creatinine, Ser: 4.23 mg/dL — ABNORMAL HIGH (ref 0.61–1.24)
GFR, Estimated: 14 mL/min — ABNORMAL LOW (ref >60)
Glucose, Bld: 115 mg/dL — ABNORMAL HIGH (ref 70–99)
Phosphorus: 3.5 mg/dL (ref 2.5–4.6)
Potassium: 4.2 mmol/L (ref 3.5–5.1)
Sodium: 136 mmol/L (ref 135–145)

## 2024-02-18 LAB — GLUCOSE, CAPILLARY
Glucose-Capillary: 114 mg/dL — ABNORMAL HIGH (ref 70–99)
Glucose-Capillary: 124 mg/dL — ABNORMAL HIGH (ref 70–99)
Glucose-Capillary: 133 mg/dL — ABNORMAL HIGH (ref 70–99)
Glucose-Capillary: 189 mg/dL — ABNORMAL HIGH (ref 70–99)
Glucose-Capillary: 204 mg/dL — ABNORMAL HIGH (ref 70–99)
Glucose-Capillary: 236 mg/dL — ABNORMAL HIGH (ref 70–99)

## 2024-02-18 LAB — CBC
HCT: 29 % — ABNORMAL LOW (ref 39.0–52.0)
Hemoglobin: 9 g/dL — ABNORMAL LOW (ref 13.0–17.0)
MCH: 29.6 pg (ref 26.0–34.0)
MCHC: 31 g/dL (ref 30.0–36.0)
MCV: 95.4 fL (ref 80.0–100.0)
Platelets: 140 10*3/uL — ABNORMAL LOW (ref 150–400)
RBC: 3.04 MIL/uL — ABNORMAL LOW (ref 4.22–5.81)
RDW: 18 % — ABNORMAL HIGH (ref 11.5–15.5)
WBC: 8 10*3/uL (ref 4.0–10.5)
nRBC: 0 % (ref 0.0–0.2)

## 2024-02-18 LAB — COOXEMETRY PANEL
Carboxyhemoglobin: 1.4 % (ref 0.5–1.5)
Methemoglobin: <0.7 % (ref 0.0–1.5)
O2 Saturation: 87.3 %
Total hemoglobin: 9 g/dL — ABNORMAL LOW (ref 12.0–16.0)

## 2024-02-18 LAB — MAGNESIUM: Magnesium: 2.3 mg/dL (ref 1.7–2.4)

## 2024-02-18 MED ORDER — HEPARIN SODIUM (PORCINE) 1000 UNIT/ML DIALYSIS
20.0000 [IU]/kg | INTRAMUSCULAR | Status: DC | PRN
Start: 2024-02-18 — End: 2024-02-20

## 2024-02-18 MED ORDER — GERHARDT'S BUTT CREAM
TOPICAL_CREAM | CUTANEOUS | Status: DC | PRN
  Filled 2024-02-18: qty 60

## 2024-02-18 MED ORDER — LIDOCAINE-PRILOCAINE 2.5-2.5 % EX CREA
1.0000 | TOPICAL_CREAM | CUTANEOUS | Status: DC | PRN
Start: 2024-02-18 — End: 2024-02-20

## 2024-02-18 MED ORDER — ALTEPLASE 2 MG IJ SOLR
2.0000 mg | Freq: Once | INTRAMUSCULAR | Status: DC | PRN
Start: 2024-02-18 — End: 2024-02-20

## 2024-02-18 MED ORDER — ANTICOAGULANT SODIUM CITRATE 4% (200MG/5ML) IV SOLN: 5.0000 mL | Status: DC | PRN

## 2024-02-18 MED ORDER — ORAL CARE MOUTH RINSE: 15.0000 mL | OROMUCOSAL | Status: DC | PRN

## 2024-02-18 MED ORDER — HEPARIN SODIUM (PORCINE) 1000 UNIT/ML DIALYSIS
1000.0000 [IU] | INTRAMUSCULAR | Status: DC | PRN
Start: 2024-02-18 — End: 2024-02-20

## 2024-02-18 MED ORDER — LIDOCAINE HCL (PF) 1 % IJ SOLN: 5.0000 mL | INTRAMUSCULAR | Status: DC | PRN

## 2024-02-18 MED ORDER — PENTAFLUOROPROP-TETRAFLUOROETH EX AERO: 1.0000 | INHALATION_SPRAY | CUTANEOUS | Status: DC | PRN

## 2024-02-18 NOTE — Progress Notes (Signed)
 Pt receives out-pt HD at [facility] [place] on TTS 11:40 am chair time. Will assist as needed.   Lauraine Polite Renal Navigator [phone]

## 2024-02-18 NOTE — Plan of Care (Signed)
 Problem: Education: Goal: Ability to describe self-care measures that may prevent or decrease complications (Diabetes Survival Skills Education) will improve 02/18/2024 0104 by Elvira Hammersmith, RN Outcome: Progressing 02/18/2024 0104 by Elvira Hammersmith, RN Outcome: Progressing 02/18/2024 0104 by Elvira Hammersmith, RN Outcome: Progressing Goal: Individualized Educational Video(s) 02/18/2024 0104 by Elvira Hammersmith, RN Outcome: Progressing 02/18/2024 0104 by Elvira Hammersmith, RN Outcome: Progressing 02/18/2024 0104 by Elvira Hammersmith, RN Outcome: Progressing   Problem: Coping: Goal: Ability to adjust to condition or change in health will improve 02/18/2024 0104 by Elvira Hammersmith, RN Outcome: Progressing 02/18/2024 0104 by Elvira Hammersmith, RN Outcome: Progressing 02/18/2024 0104 by Elvira Hammersmith, RN Outcome: Progressing   Problem: Fluid Volume: Goal: Ability to maintain a balanced intake and output will improve 02/18/2024 0104 by Elvira Hammersmith, RN Outcome: Progressing 02/18/2024 0104 by Elvira Hammersmith, RN Outcome: Progressing 02/18/2024 0104 by Elvira Hammersmith, RN Outcome: Progressing   Problem: Health Behavior/Discharge Planning: Goal: Ability to identify and utilize available resources and services will improve 02/18/2024 0104 by Elvira Hammersmith, RN Outcome: Progressing 02/18/2024 0104 by Elvira Hammersmith, RN Outcome: Progressing 02/18/2024 0104 by Elvira Hammersmith, RN Outcome: Progressing Goal: Ability to manage health-related needs will improve 02/18/2024 0104 by Elvira Hammersmith, RN Outcome: Progressing 02/18/2024 0104 by Elvira Hammersmith, RN Outcome: Progressing 02/18/2024 0104 by Elvira Hammersmith, RN Outcome: Progressing   Problem: Metabolic: Goal: Ability to maintain appropriate glucose levels will improve 02/18/2024 0104 by Elvira Hammersmith, RN Outcome: Progressing 02/18/2024 0104 by Elvira Hammersmith, RN Outcome:  Progressing 02/18/2024 0104 by Elvira Hammersmith, RN Outcome: Progressing   Problem: Nutritional: Goal: Maintenance of adequate nutrition will improve 02/18/2024 0104 by Elvira Hammersmith, RN Outcome: Progressing 02/18/2024 0104 by Elvira Hammersmith, RN Outcome: Progressing 02/18/2024 0104 by Elvira Hammersmith, RN Outcome: Progressing Goal: Progress toward achieving an optimal weight will improve 02/18/2024 0104 by Elvira Hammersmith, RN Outcome: Progressing 02/18/2024 0104 by Elvira Hammersmith, RN Outcome: Progressing 02/18/2024 0104 by Elvira Hammersmith, RN Outcome: Progressing   Problem: Skin Integrity: Goal: Risk for impaired skin integrity will decrease 02/18/2024 0104 by Elvira Hammersmith, RN Outcome: Progressing 02/18/2024 0104 by Elvira Hammersmith, RN Outcome: Progressing 02/18/2024 0104 by Elvira Hammersmith, RN Outcome: Progressing   Problem: Tissue Perfusion: Goal: Adequacy of tissue perfusion will improve 02/18/2024 0104 by Elvira Hammersmith, RN Outcome: Progressing 02/18/2024 0104 by Elvira Hammersmith, RN Outcome: Progressing 02/18/2024 0104 by Elvira Hammersmith, RN Outcome: Progressing   Problem: Education: Goal: Knowledge of General Education information will improve Description: Including pain rating scale, medication(s)/side effects and non-pharmacologic comfort measures 02/18/2024 0104 by Elvira Hammersmith, RN Outcome: Progressing 02/18/2024 0104 by Elvira Hammersmith, RN Outcome: Progressing 02/18/2024 0104 by Elvira Hammersmith, RN Outcome: Progressing   Problem: Health Behavior/Discharge Planning: Goal: Ability to manage health-related needs will improve 02/18/2024 0104 by Elvira Hammersmith, RN Outcome: Progressing 02/18/2024 0104 by Elvira Hammersmith, RN Outcome: Progressing 02/18/2024 0104 by Elvira Hammersmith, RN Outcome: Progressing   Problem: Clinical Measurements: Goal: Ability to maintain clinical measurements within normal limits will  improve 02/18/2024 0104 by Elvira Hammersmith, RN Outcome: Progressing 02/18/2024 0104 by Elvira Hammersmith, RN Outcome: Progressing 02/18/2024 0104 by Elvira Hammersmith, RN Outcome: Progressing Goal: Will remain free from infection 02/18/2024 0104 by Elvira Hammersmith, RN Outcome: Progressing 02/18/2024 0104 by Elvira Hammersmith, RN Outcome: Progressing 02/18/2024 0104 by  Nadir Vasques, Elder Greening, RN Outcome: Progressing Goal: Diagnostic test results will improve 02/18/2024 0104 by Elvira Hammersmith, RN Outcome: Progressing 02/18/2024 0104 by Elvira Hammersmith, RN Outcome: Progressing 02/18/2024 0104 by Elvira Hammersmith, RN Outcome: Progressing Goal: Respiratory complications will improve 02/18/2024 0104 by Elvira Hammersmith, RN Outcome: Progressing 02/18/2024 0104 by Elvira Hammersmith, RN Outcome: Progressing 02/18/2024 0104 by Elvira Hammersmith, RN Outcome: Progressing Goal: Cardiovascular complication will be avoided 02/18/2024 0104 by Elvira Hammersmith, RN Outcome: Progressing 02/18/2024 0104 by Elvira Hammersmith, RN Outcome: Progressing 02/18/2024 0104 by Elvira Hammersmith, RN Outcome: Progressing   Problem: Activity: Goal: Risk for activity intolerance will decrease 02/18/2024 0104 by Elvira Hammersmith, RN Outcome: Progressing 02/18/2024 0104 by Elvira Hammersmith, RN Outcome: Progressing 02/18/2024 0104 by Elvira Hammersmith, RN Outcome: Progressing   Problem: Nutrition: Goal: Adequate nutrition will be maintained 02/18/2024 0104 by Elvira Hammersmith, RN Outcome: Progressing 02/18/2024 0104 by Elvira Hammersmith, RN Outcome: Progressing 02/18/2024 0104 by Elvira Hammersmith, RN Outcome: Progressing   Problem: Coping: Goal: Level of anxiety will decrease 02/18/2024 0104 by Elvira Hammersmith, RN Outcome: Progressing 02/18/2024 0104 by Elvira Hammersmith, RN Outcome: Progressing 02/18/2024 0104 by Elvira Hammersmith, RN Outcome: Progressing   Problem:  Elimination: Goal: Will not experience complications related to bowel motility 02/18/2024 0104 by Elvira Hammersmith, RN Outcome: Progressing 02/18/2024 0104 by Elvira Hammersmith, RN Outcome: Progressing 02/18/2024 0104 by Elvira Hammersmith, RN Outcome: Progressing Goal: Will not experience complications related to urinary retention 02/18/2024 0104 by Elvira Hammersmith, RN Outcome: Progressing 02/18/2024 0104 by Elvira Hammersmith, RN Outcome: Progressing 02/18/2024 0104 by Elvira Hammersmith, RN Outcome: Progressing   Problem: Pain Managment: Goal: General experience of comfort will improve and/or be controlled 02/18/2024 0104 by Elvira Hammersmith, RN Outcome: Progressing 02/18/2024 0104 by Elvira Hammersmith, RN Outcome: Progressing 02/18/2024 0104 by Elvira Hammersmith, RN Outcome: Progressing   Problem: Safety: Goal: Ability to remain free from injury will improve 02/18/2024 0104 by Elvira Hammersmith, RN Outcome: Progressing 02/18/2024 0104 by Elvira Hammersmith, RN Outcome: Progressing 02/18/2024 0104 by Elvira Hammersmith, RN Outcome: Progressing   Problem: Skin Integrity: Goal: Risk for impaired skin integrity will decrease 02/18/2024 0104 by Elvira Hammersmith, RN Outcome: Progressing 02/18/2024 0104 by Elvira Hammersmith, RN Outcome: Progressing 02/18/2024 0104 by Elvira Hammersmith, RN Outcome: Progressing   Problem: Education: Goal: Understanding of CV disease, CV risk reduction, and recovery process will improve 02/18/2024 0104 by Elvira Hammersmith, RN Outcome: Progressing 02/18/2024 0104 by Elvira Hammersmith, RN Outcome: Progressing 02/18/2024 0104 by Elvira Hammersmith, RN Outcome: Progressing Goal: Individualized Educational Video(s) 02/18/2024 0104 by Elvira Hammersmith, RN Outcome: Progressing 02/18/2024 0104 by Elvira Hammersmith, RN Outcome: Progressing 02/18/2024 0104 by Elvira Hammersmith, RN Outcome: Progressing   Problem: Activity: Goal: Ability to  return to baseline activity level will improve 02/18/2024 0104 by Elvira Hammersmith, RN Outcome: Progressing 02/18/2024 0104 by Elvira Hammersmith, RN Outcome: Progressing 02/18/2024 0104 by Elvira Hammersmith, RN Outcome: Progressing   Problem: Cardiovascular: Goal: Ability to achieve and maintain adequate cardiovascular perfusion will improve 02/18/2024 0104 by Elvira Hammersmith, RN Outcome: Progressing 02/18/2024 0104 by Elvira Hammersmith, RN Outcome: Progressing 02/18/2024 0104 by Elvira Hammersmith, RN Outcome: Progressing Goal: Vascular access site(s) Level 0-1 will be maintained 02/18/2024 0104 by Elvira Hammersmith, RN Outcome: Progressing 02/18/2024 0104 by Elvira Hammersmith, RN Outcome:  Progressing 02/18/2024 0104 by Elvira Hammersmith, RN Outcome: Progressing   Problem: Health Behavior/Discharge Planning: Goal: Ability to safely manage health-related needs after discharge will improve 02/18/2024 0104 by Elvira Hammersmith, RN Outcome: Progressing 02/18/2024 0104 by Elvira Hammersmith, RN Outcome: Progressing 02/18/2024 0104 by Elvira Hammersmith, RN Outcome: Progressing   Problem: Education: Goal: Understanding of CV disease, CV risk reduction, and recovery process will improve 02/18/2024 0104 by Elvira Hammersmith, RN Outcome: Progressing 02/18/2024 0104 by Elvira Hammersmith, RN Outcome: Progressing 02/18/2024 0104 by Elvira Hammersmith, RN Outcome: Progressing Goal: Individualized Educational Video(s) 02/18/2024 0104 by Elvira Hammersmith, RN Outcome: Progressing 02/18/2024 0104 by Elvira Hammersmith, RN Outcome: Progressing 02/18/2024 0104 by Elvira Hammersmith, RN Outcome: Progressing   Problem: Activity: Goal: Ability to return to baseline activity level will improve 02/18/2024 0104 by Elvira Hammersmith, RN Outcome: Progressing 02/18/2024 0104 by Elvira Hammersmith, RN Outcome: Progressing 02/18/2024 0104 by Elvira Hammersmith, RN Outcome: Progressing    Problem: Cardiovascular: Goal: Ability to achieve and maintain adequate cardiovascular perfusion will improve 02/18/2024 0104 by Elvira Hammersmith, RN Outcome: Progressing 02/18/2024 0104 by Elvira Hammersmith, RN Outcome: Progressing 02/18/2024 0104 by Elvira Hammersmith, RN Outcome: Progressing Goal: Vascular access site(s) Level 0-1 will be maintained 02/18/2024 0104 by Elvira Hammersmith, RN Outcome: Progressing 02/18/2024 0104 by Elvira Hammersmith, RN Outcome: Progressing 02/18/2024 0104 by Elvira Hammersmith, RN Outcome: Progressing   Problem: Health Behavior/Discharge Planning: Goal: Ability to safely manage health-related needs after discharge will improve 02/18/2024 0104 by Elvira Hammersmith, RN Outcome: Progressing 02/18/2024 0104 by Elvira Hammersmith, RN Outcome: Progressing 02/18/2024 0104 by Elvira Hammersmith, RN Outcome: Progressing

## 2024-02-18 NOTE — Progress Notes (Signed)
 Calorie Count Note  48 hour calorie count ordered.  Diet: Regular, Thin liquids Supplements: Ensure Plus High Protein, Magic Cup  Estimated Nutritional Needs:  Kcal:  2200-2400 kcals Protein:  120-140 gm Fluid:  1000 mL plus UOP  6/09 Dinner: 378 calories, 17 gm protein 6/10 Breakfast: 852 calories, 27 gm protein 6/10 Lunch: did not eat per RN Supplements: 145 calories, 5 gm protein  Total intake: 1375 kcal (63% of minimum estimated needs)  49 protein (41% of minimum estimated needs)  Nutrition Diagnosis: Moderate Malnutrition related to chronic illness as evidenced by moderate fat depletion, moderate muscle depletion, edema   Goal: Patient will meet greater than or equal to 90% of their needs   Intervention:  Ensure Plus High Protein po BID, each supplement provides 350 kcal and 20 grams of protein Magic cup TID with meals, each supplement provides 290 kcal and 9 grams of protein Renal Multivitamin w/ minerals daily   Doneta Furbish RD, LDN Clinical Dietitian

## 2024-02-18 NOTE — Progress Notes (Signed)
 [name] NEPHROLOGY PROGRESS NOTE  Assessment/ Plan: Pt is a 68 y.o. yo male   OP HD: [facility] 4h  B450   107kg   AVF  Heparin  none Last OP HD 5/31, post wt 112.5kg Partial HD 6/03, post wt 116.5kg (up 9kg), 1.5hr session, sent to ed Hectorol  2 mcg iv three times per week  # Pericardial effusion/ tamponade: s/p pericardiocentesis 6/05 per cardiology.  # Shock: off levo and vaso.  On midodrine  now, monitor BP.   # ESRD: usual HD TTS. CRRT 6/5-6/8. Plan iHD today.   # Anemia of esrd: Hb 9-11  here, follow.   # Secondary hyperparathyroidism: CCa in range, phos low d/t CRRT.  Currently on sevelamer  as he started eating.   #PAF: on eliquis  #CAD: sp PCI w/ DES in march 2025   Subjective: Seen and examined.  Denies nausea, vomiting, chest pain, shortness of breath.  No new event.  Discussed with nurse.  Objective Vital signs in last 24 hours: Vitals:   02/18/24 0800 02/18/24 0900 02/18/24 0904 02/18/24 0930  BP:   126/74   Pulse: 90 79 89 86  Resp: (!) 21 16 17 18   Temp:      TempSrc:      SpO2: 100% 100% 99% 100%  Weight:      Height:       Weight change:   Intake/Output Summary (Last 24 hours) at 02/18/2024 1122 Last data filed at 02/18/2024 0600 Gross per 24 hour  Intake 878.88 ml  Output 950 ml  Net -71.12 ml       Labs: RENAL PANEL Recent Labs  Lab 02/13/24 0518 02/13/24 0840 02/14/24 0440 02/14/24 1536 02/15/24 0330 02/15/24 1609 02/16/24 0429 02/17/24 0636 02/18/24 0802  NA 135   < > 136 137 135 135 136 135 136  K 5.4*   < > 4.3 4.2 3.9 3.9 3.9 3.8 4.2  CL 97*   < > 98 100 102 102 103 104 104  CO2 19*   < > 24 22 24 25 24 22 22   GLUCOSE 187*   < > 181* 182* 242* 245* 191* 143* 115*  BUN 45*   < > 34* 29* 28* 26* 22 29* 40*  CREATININE 5.23*   < > 3.53* 2.96* 2.60* 2.24* 1.91* 2.81* 4.23*  CALCIUM  9.1   < > 8.6* 8.7* 8.9 8.7* 8.5* 7.8* 8.0*  MG 2.5*  --  2.1  --  2.0  --  2.2  --  2.3  PHOS 6.6*   < > 4.2 3.4 2.5 1.9*  2.3*  --  3.5  ALBUMIN  3.4*   < > 2.8* 2.6* 2.5* 2.6* 2.7*  --  2.4*   < > = values in this interval not displayed.    Liver Function Tests: Recent Labs  Lab 02/11/24 1445 02/13/24 0518 02/15/24 1609 02/16/24 0429 02/18/24 0802  AST 22  --   --   --   --   ALT 17  --   --   --   --   ALKPHOS 82  --   --   --   --   BILITOT 0.8  --   --   --   --   PROT 5.6*  --   --   --   --   ALBUMIN  2.6*   < > 2.6* 2.7* 2.4*   < > = values in this interval not displayed.   No results for input(s): "LIPASE", "AMYLASE" in  the last 168 hours. No results for input(s): "AMMONIA" in the last 168 hours. CBC: Recent Labs    03/30/23 1922 03/31/23 0314 05/28/23 1748 05/29/23 0419 02/15/24 0330 02/15/24 1145 02/16/24 0429 02/17/24 0636 02/18/24 0802  HGB  --    < >  --    < > 8.5* 8.6* 8.7* 8.9* 9.0*  MCV  --    < >  --    < > 92.9  --  93.2 94.0 95.4  VITAMINB12 432  --  411  --   --   --   --   --   --   FOLATE >40.0  --   --   --   --   --   --   --   --   FERRITIN 939*  --   --   --   --   --   --   --   --   TIBC 224*  --   --   --   --   --   --   --   --   IRON 81  --   --   --   --   --   --   --   --   RETICCTPCT 1.3  --   --   --   --   --   --   --   --    < > = values in this interval not displayed.    Cardiac Enzymes: No results for input(s): "CKTOTAL", "CKMB", "CKMBINDEX", "TROPONINI" in the last 168 hours. CBG: Recent Labs  Lab 02/17/24 1558 02/17/24 1953 02/17/24 2341 02/18/24 0503 02/18/24 0755  GLUCAP 129* 153* 160* 124* 114*    Iron Studies: No results for input(s): "IRON", "TIBC", "TRANSFERRIN", "FERRITIN" in the last 72 hours. Studies/Results: ECHOCARDIOGRAM LIMITED Result Date: 02/17/2024    ECHOCARDIOGRAM LIMITED REPORT   Patient Name:   [name] Date of Exam: 02/17/2024 Medical Rec #:  [id number]     Height:       76.0 in Accession #:    [id number]    Weight:       243.6 lb Date of Birth:  12/19/1955     BSA:          2.409 m Patient Age:    67 years       BP:           116/49 mmHg Patient Gender: M             HR:           99 bpm. Exam Location:  Inpatient Procedure: Limited Echo (Both Spectral and Color Flow Doppler were utilized            during procedure). Indications:    Pericardial effusion I31.3  History:        Patient has prior history of Echocardiogram examinations, most                 recent 02/14/2024. COPD; Risk Factors:Hypertension.  Sonographer:    Astrid Blamer Referring Phys: [id number] [place] LEE IMPRESSIONS  1. No evidence of pericardial effusion. Moderate pleural effusion.  2. Left ventricular ejection fraction, by estimation, is 50 to 55%. The left ventricle has low normal function.  3. The mitral valve is abnormal. Severe mitral annular calcification.  4. The aortic valve is calcified. FINDINGS  Left Ventricle: Left ventricular ejection fraction, by estimation, is 50 to 55%. The left ventricle has low normal  function. Pericardium: No evidence of pericardial effusion. There is no evidence of pericardial effusion. Mitral Valve: The mitral valve is abnormal. Severe mitral annular calcification. Aortic Valve: The aortic valve is calcified. Additional Comments: There is a moderate pleural effusion.  Arta Lark Electronically signed by Arta Lark Signature Date/Time: 02/17/2024/1:55:52 PM    Final    DG CHEST PORT 1 VIEW Result Date: 02/17/2024 CLINICAL DATA:  Pleural effusion EXAM: PORTABLE CHEST 1 VIEW COMPARISON:  Chest x-ray performed February 13, 2024 FINDINGS: Left-sided approach vascular catheter with tip overlying the SVC. Small bilateral pleural effusions. Heart is enlarged. No pneumothorax. Central pulmonary vascular congestion. IMPRESSION: 1. Modest improvement in small left effusion with associated consolidation. 2. Trace right pleural effusion. 3. Central pulmonary vascular congestion. Electronically Signed   By: Reagan Camera M.D.   On: 02/17/2024 08:29    Medications: Infusions:  anticoagulant sodium citrate      ceFEPime   (MAXIPIME ) IV Stopped (02/17/24 1217)    Scheduled Medications:  arformoterol   15 mcg Nebulization BID   atorvastatin   80 mg Oral QHS   Chlorhexidine  Gluconate Cloth  6 each Topical Q0600   Chlorhexidine  Gluconate Cloth  6 each Topical Q0600   clopidogrel   75 mg Oral Q breakfast   colchicine  0.3 mg Oral Daily   feeding supplement  237 mL Oral BID BM   gabapentin   100 mg Oral QHS   insulin  aspart  0-6 Units Subcutaneous Q4H   insulin  glargine-yfgn  7 Units Subcutaneous BID   midodrine   10 mg Oral Q8H   multivitamin  1 tablet Oral QHS   mupirocin  ointment  1 Application Nasal BID   revefenacin   175 mcg Nebulization Daily   rOPINIRole   2 mg Oral QHS   sevelamer  carbonate  1,600 mg Oral TID WC   thiamine   100 mg Oral Daily   vancomycin   125 mg Oral Daily   vancomycin  variable dose per unstable renal function (pharmacist dosing)   Does not apply See admin instructions    have reviewed scheduled and prn medications.  Physical Exam: General:NAD, comfortable Heart:RRR, s1s2 nl Lungs:clear b/l, no crackle Abdomen:soft, Non-tender, non-distended Extremities:No edema Dialysis Access: AV fistula.  Ardean Simonich Prasad Hser Belanger 02/18/2024,11:22 AM  LOS: 7 days

## 2024-02-18 NOTE — IPAL (Signed)
  Interdisciplinary Goals of Care Family Meeting   Date carried out:: 02/18/2024  Location of the meeting: Phone conference  Member's involved: Physician and Family Member or next of kin  Durable Power of Attorney or Environmental health practitioner: wife    Discussion: We discussed goals of care for [name] .  We reviewed his care and overall poor health with multiple recent admissions. We discussed the plan for iHD today and that CRRT is not a long-term strategy he can use for dialysis. She said he wants to continue all aggressive care based on his previous discussions, but she said he doesn't understand a lot of what has been happening.   Code status: Full Code- did not discuss today  Disposition: Continue current acute care   Time spent for the meeting: 10 min.  [name] 02/18/2024, 11:28 AM

## 2024-02-18 NOTE — Progress Notes (Signed)
 NAME:  [name], MRN:  [id number], DOB:  09/18/1955, LOS: 7 ADMISSION DATE:  02/11/2024, CONSULTATION DATE:  02/12/2024 REFERRING MD:  Arbie Knock CHIEF COMPLAINT:  hypotension   History of Present Illness:  68 year old male with PMH of COPD, insulin -dependent T2DM, GERD, CAD, PAD, HLD, HTN, OSA, ESRD on HD TTS, recent admission for Cdiff colitis who presented to the emergency department on 6/3 weakness and hypotension, decreased PO intake. He was reportedly at dialysis when he became hypotensive after 1L removed to 80s systolic. In ED, Na 133, Cl 93, BUN 41/sCr 6.26, AG 16, WBC 13.0, hgb 10.5, trop 44, lactic 2.4. In ED he received 1L IVF, midodrine  with improvement in his BP. He was admitted to hospitalist. Hospitalist gave albumin  25g x2.   Pertinent  Medical History  COPD, insulin -dependent T2DM, GERD, CAD, PAD, HLD, HTN, OSA, ESRD on HD TTS, recent admission for Cdiff colitis   Significant Hospital Events: Including procedures, antibiotic start and stop dates in addition to other pertinent events   6/3 admitted after presenting for hypotension 6/4 rapid response called for bradycardic episodes and hypotension. PCCM consulted.  6/5 pericardial drain; initial drainage 1280cc, 570cc out in subsequent <24 hrs. TVP placed. 6/8 TVP, pericardial drain d/c. Self-d/c cortrak.  Interim History / Subjective:  Remains off CRRT, planning for iHD today. He denies complaints.   Objective    Blood pressure 126/74, pulse 86, temperature 97.8 F (36.6 C), temperature source Axillary, resp. rate 18, height 6\' 4"  (1.93 m), weight 110.5 kg, SpO2 100%. CVP:  [1 mmHg-10 mmHg] 4 mmHg      Intake/Output Summary (Last 24 hours) at 02/18/2024 1208 Last data filed at 02/18/2024 0600 Gross per 24 hour  Intake 768.77 ml  Output 550 ml  Net 218.77 ml   Filed Weights   02/14/24 0500 02/15/24 0500 02/16/24 0500  Weight: 114.4 kg 111.5 kg 110.5 kg    Examination: General: chronically ill appearing man  lying in bed in NAD HEENT: [place]/AT, eyes anicteric Neuro: lethargic, arouses to stimulation and answering some questions. Moving arms, globally weak. Chest: breathing comfortably on RA, reduced basilar breath sounds Heart: S1S2, reg rate, irreg rhythm Abdomen: obese, soft, NT Extremities:  ongoing edema, BLE BKA     Pericardial fluid: 6750 WBC, 88% PMNs, 6% lymphs  Coox 87% BUN 40, Cr 4.23 on CRRT holiday WBC 8 H/H 9/29 Platelets 140 Glucose 110-180  Resolved Hospital Problem list     Assessment & Plan:  Combined shock-  septic due to right lower lobe pneumonia, obstructive shock, due to large pericardial effusion with cardiac tamponade> relieved by pericardial drain. Drain removed. Lactic acidosis due to shock -Apixaban  has been on hold for hemorrhagic pericardial effusion. Need to determine when we can rechallenge with heparin .  -plavix , holding aspirin  -Last day of antibiotics today -Etiology of pericarditis and effusion unknown-- suspect HD, but came in with ok BUN. Possibly serositis from hydralazine - DILE? 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  -Continue midodrine  10 mg 3 times daily  Symptomatic bradycardia in the setting of AV nodal blocking agents with beta-blocker and amiodarone ; s/p TVP Rate controlled Afib Coronary Artery Disease s/p stent to ostial/proximal RCA and mid RCA, PCI proximal RCA 11/2023 Paroxysmal atrial fibrillation , currently rate controlled  - Continue holding aspirin , continue daily statin and Plavix  - Not needing rate control, continue holding amiodarone  and beta-blocker --Continue holding PTA antihypertensives - Remains off pressors.  Ate cereal to tolerate intermittent hemodialysis.  Recent C.  Diff Colitis- during last admission, he was on vancomycin  which was supposed to be completed on 6/4. -Continue oral vancomycin  for 5 days after broad-spectrum antibiotics  Acute respiratory failure with hypoxia Right  lower lobe pneumonia Bilateral pleural effusions> likely 2/2 ESRD. L>R, both dependent. -Pulmonary hygiene - Has declined thoracentesis on the left.  Volume management with intermittent hemodialysis  COPD, not acutely exacerbated -Continue Brovana  and Yupelri   ESRD  Hyperkalemia; resolved -Underwent hemodialysis today - Strict I's/O - Renally dose meds  DMII with uncontrolled hyperglycemia -Continue glargine 7 units twice daily - Sliding scale insulin  as needed - Can add mealtime insulin  if p.o. intake warrants -Goal blood glucose 140-180  Concern for dysphagia Moderate protein energy malnutrition; self discontinued his cortrak today -Encourage p.o. intake - Refused core track  PAD -Continue statin and Plavix   Anemia due to ESRD; 3g drop in Hb in the past few days likely due to hemorrhagic pericarditis Transfuse for hemoglobin less than 7 or hemodynamically significant bleeding  Prolonged Qtc (even corrected for BBB) -Avoid TC prolonging meds - Telemetry monitoring  GG pulmonary nodule RUL; incidental finding  -needs OP follow up CT in 6-12 months   Palliative care consulted - complex disease, baseline disability, recurrent hospitalizations.  Discussed briefly with wife today.  She indicated further discussions that he wants to continue aggressive care.  Best Practice (right click and "Reselect all SmartList Selections" daily)   Diet/type: dysphagia diet (see orders) DVT prophylaxis: SCD; AC on hold Pressure ulcer(s). Clinically undetermined if the pressure ulcer was present on admission, see nursing notes GI prophylaxis: N/A Lines: Central line and Arterial Line- will need to talk to nephro to see when temp HD catheter can be d/c Foley:  N/A Code Status:  full code Last date of multidisciplinary goals of care discussion [Discussed with patient, he wishes to be full code]  Labs   CBC: Recent Labs  Lab 02/11/24 1445 02/12/24 0522 02/14/24 1202 02/15/24 0330  02/15/24 1145 02/16/24 0429 02/17/24 0636 02/18/24 0802  WBC 13.0*   < > 20.3* 16.0*  --  11.3* 7.8 8.0  NEUTROABS 10.6*  --   --   --   --   --   --   --   HGB 10.5*   < > 8.9* 8.5* 8.6* 8.7* 8.9* 9.0*  HCT 34.2*   < > 28.3* 27.3* 27.0* 27.4* 28.3* 29.0*  MCV 94.0   < > 93.1 92.9  --  93.2 94.0 95.4  PLT 278   < > 284 245  --  183 145* 140*   < > = values in this interval not displayed.    Basic Metabolic Panel: Recent Labs  Lab 02/13/24 0518 02/13/24 0840 02/14/24 0440 02/14/24 1536 02/15/24 0330 02/15/24 1609 02/16/24 0429 02/17/24 0636 02/18/24 0802  NA 135   < > 136 137 135 135 136 135 136  K 5.4*   < > 4.3 4.2 3.9 3.9 3.9 3.8 4.2  CL 97*   < > 98 100 102 102 103 104 104  CO2 19*   < > 24 22 24 25 24 22 22   GLUCOSE 187*   < > 181* 182* 242* 245* 191* 143* 115*  BUN 45*   < > 34* 29* 28* 26* 22 29* 40*  CREATININE 5.23*   < > 3.53* 2.96* 2.60* 2.24* 1.91* 2.81* 4.23*  CALCIUM  9.1   < > 8.6* 8.7* 8.9 8.7* 8.5* 7.8* 8.0*  MG 2.5*  --  2.1  --  2.0  --  2.2  --  2.3  PHOS 6.6*   < > 4.2 3.4 2.5 1.9* 2.3*  --  3.5   < > = values in this interval not displayed.   GFR: Estimated Creatinine Clearance: 22.4 mL/min (A) (by C-G formula based on SCr of 4.23 mg/dL (H)). Recent Labs  Lab 02/12/24 1709 02/13/24 0518 02/13/24 0844 02/13/24 1149 02/13/24 1537 02/13/24 1544 02/14/24 0440 02/15/24 0330 02/16/24 0429 02/17/24 0636 02/18/24 0802  WBC  --    < >  --   --    < >  --    < > 16.0* 11.3* 7.8 8.0  LATICACIDVEN 2.4*  --  12.4* 13.4*  --  5.8*  --   --   --   --   --    < > = values in this interval not displayed.      Joesph Mussel, DO 02/18/24 12:38 PM [facility]  For contact information, see Amion. If no response to pager, please call PCCM consult pager. After hours, 7PM- 7AM, please call Elink.

## 2024-02-18 NOTE — Progress Notes (Signed)
 Patient ID: [name], male   DOB: August 14, 1956, 68 y.o.   MRN: [id number]     Advanced Heart Failure Rounding Note  Cardiologist: Knox Perl, MD  Chief Complaint: Shock Subjective:    6/5: tamponade s/p pericardiocentesis; started on CRRT  6/8: TVP and pericardial drain removed  Echo EF 45% effusion resolved.   Now off amio due to post conversion pauses.   Off inotropes. Co-ox 87% Peed 950 with IV lasix  yesterday  Confused at times. Feels ok  Denies CP or SOB.   Objective:    Weight Range: 110.5 kg Body mass index is 29.65 kg/m.   Vital Signs:   Temp:  [97.1 F (36.2 C)-98.3 F (36.8 C)] 98.3 F (36.8 C) (06/09 2330) Pulse Rate:  [62-95] 86 (06/10 0930) Resp:  [14-28] 18 (06/10 0930) BP: (126)/(74) 126/74 (06/10 0904) SpO2:  [99 %-100 %] 100 % (06/10 0930) Arterial Line BP: (83-129)/(31-56) 106/41 (06/10 0930) Last BM Date : 02/15/24  Weight change: Filed Weights   02/14/24 0500 02/15/24 0500 02/16/24 0500  Weight: 114.4 kg 111.5 kg 110.5 kg   Intake/Output:  Intake/Output Summary (Last 24 hours) at 02/18/2024 1108 Last data filed at 02/18/2024 0600 Gross per 24 hour  Intake 878.88 ml  Output 950 ml  Net -71.12 ml    Physical Exam   General:  Sitting up in bed  No resp difficulty HEENT: normal Neck: supple. no JVD. Carotids 2+ bilat; no bruits. No lymphadenopathy or thryomegaly appreciated. Cor: Irregular rate & rhythm. No rubs, gallops or murmurs. Lungs: clear Abdomen: soft, nontender, nondistended. No hepatosplenomegaly. No bruits or masses. Good bowel sounds. Extremities: no cyanosis, clubbing, rash, s/p B BKA Neuro: alert confused   Telemetry   In/out AF with 4 sec post-conversion pause. Rates 70-80s in AF Personally reviewed  Labs    CBC Recent Labs    02/17/24 0636 02/18/24 0802  WBC 7.8 8.0  HGB 8.9* 9.0*  HCT 28.3* 29.0*  MCV 94.0 95.4  PLT 145* 140*   Basic Metabolic Panel Recent Labs    25/95/63 0429 02/17/24 0636  02/18/24 0802  NA 136 135 136  K 3.9 3.8 4.2  CL 103 104 104  CO2 24 22 22   GLUCOSE 191* 143* 115*  BUN 22 29* 40*  CREATININE 1.91* 2.81* 4.23*  CALCIUM  8.5* 7.8* 8.0*  MG 2.2  --  2.3  PHOS 2.3*  --  3.5   Liver Function Tests Recent Labs    02/16/24 0429 02/18/24 0802  ALBUMIN  2.7* 2.4*   Medications:    Scheduled Medications:  arformoterol   15 mcg Nebulization BID   atorvastatin   80 mg Oral QHS   Chlorhexidine  Gluconate Cloth  6 each Topical Q0600   Chlorhexidine  Gluconate Cloth  6 each Topical Q0600   clopidogrel   75 mg Oral Q breakfast   colchicine  0.3 mg Oral Daily   feeding supplement  237 mL Oral BID BM   gabapentin   100 mg Oral QHS   insulin  aspart  0-6 Units Subcutaneous Q4H   insulin  glargine-yfgn  7 Units Subcutaneous BID   midodrine   10 mg Oral Q8H   multivitamin  1 tablet Oral QHS   mupirocin  ointment  1 Application Nasal BID   revefenacin   175 mcg Nebulization Daily   rOPINIRole   2 mg Oral QHS   sevelamer  carbonate  1,600 mg Oral TID WC   thiamine   100 mg Oral Daily   vancomycin   125 mg Oral Daily   vancomycin  variable  dose per unstable renal function (pharmacist dosing)   Does not apply See admin instructions   Infusions:  anticoagulant sodium citrate      ceFEPime  (MAXIPIME ) IV Stopped (02/17/24 1217)   PRN Medications: acetaminophen , alteplase , anticoagulant sodium citrate , atropine , [name]'s butt cream, heparin , heparin , oxyCODONE -acetaminophen  **OR** oxyCODONE -acetaminophen  **OR** HYDROmorphone  (DILAUDID ) injection **OR** HYDROmorphone  (DILAUDID ) injection, ipratropium-albuterol , lidocaine  (PF), lidocaine -prilocaine , melatonin, mouth rinse, pentafluoroprop-tetrafluoroeth, polyethylene glycol, prochlorperazine   Assessment/Plan   1. Pericardial effusion: On 6/5 echo there was a large pericardial effusion with tamponade, pericardiocentesis with 1280 cc bloody fluid out.   - Pericardial drain removed 6/8. Cytology negative - Limited echo 6/9 EF  45% No significant effusion - pending results will discuss started back on heparin   2. AF with tachy-brady syndrome: Patient was on Coreg  and amiodarone  at home. Noted to have long pauses up to 6 seconds on 6/4.  TVP placed but has not required. Removed 6/8.   - Off nodal blockers.  - Extremely poor candidate for device therapy - Now in AF with post-conversion pauses (asymptomatic), Not candidate for amio with heart block on admit - Will leave in AF. Remains rate controlled - Can restart AC tomorrow (holding due to effusion)   3. Shock: Hypotensive with HD prior to admission, improving slowly, now coming off vaso. - mixed septic/cardiogenic (tamponade) - continue midodrine  10 mg tid - now off pressors  4. ID: CXR with possible LLL PNA.  Afebrile.   - Broad spectrum coverage with vancomycin /cefepime  - improving L effusion noted on CXR.  - CCM managing   5. C difficile diarrhea: - CCM managing  6. ESRD: Off CRRT, transition to [facility] tomorrow. Oliguric. - Appreciate nephrology - continue iHD  7. CAD: NSTEMI 3/25 s/p PCI/DES LCX, PTCA/shockwave lithotripsy p RCA. No chest pain.  - Continue Plavix .  Has not been on ASA with anticoagulation.  - Continue atorvastatin .    Jules Oar, MD  02/18/2024, 11:08 AM  Advanced Heart Failure Team Pager [phone] (M-F; 7a - 5p)

## 2024-02-19 DIAGNOSIS — Z515 Encounter for palliative care: Secondary | ICD-10-CM

## 2024-02-19 DIAGNOSIS — Z7189 Other specified counseling: Secondary | ICD-10-CM

## 2024-02-19 DIAGNOSIS — I314 Cardiac tamponade: Secondary | ICD-10-CM

## 2024-02-19 LAB — RENAL FUNCTION PANEL
Albumin: 2.3 g/dL — ABNORMAL LOW (ref 3.5–5.0)
Anion gap: 10 (ref 5–15)
BUN: 25 mg/dL — ABNORMAL HIGH (ref 8–23)
CO2: 25 mmol/L (ref 22–32)
Calcium: 7.7 mg/dL — ABNORMAL LOW (ref 8.9–10.3)
Chloride: 99 mmol/L (ref 98–111)
Creatinine, Ser: 3.14 mg/dL — ABNORMAL HIGH (ref 0.61–1.24)
GFR, Estimated: 21 mL/min — ABNORMAL LOW (ref >60)
Glucose, Bld: 178 mg/dL — ABNORMAL HIGH (ref 70–99)
Phosphorus: 2.9 mg/dL (ref 2.5–4.6)
Potassium: 4 mmol/L (ref 3.5–5.1)
Sodium: 134 mmol/L — ABNORMAL LOW (ref 135–145)

## 2024-02-19 LAB — CBC
HCT: 29.1 % — ABNORMAL LOW (ref 39.0–52.0)
Hemoglobin: 9.3 g/dL — ABNORMAL LOW (ref 13.0–17.0)
MCH: 30.1 pg (ref 26.0–34.0)
MCHC: 32 g/dL (ref 30.0–36.0)
MCV: 94.2 fL (ref 80.0–100.0)
Platelets: 120 10*3/uL — ABNORMAL LOW (ref 150–400)
RBC: 3.09 MIL/uL — ABNORMAL LOW (ref 4.22–5.81)
RDW: 18.2 % — ABNORMAL HIGH (ref 11.5–15.5)
WBC: 7.3 10*3/uL (ref 4.0–10.5)
nRBC: 0 % (ref 0.0–0.2)

## 2024-02-19 LAB — GLUCOSE, CAPILLARY
Glucose-Capillary: 112 mg/dL — ABNORMAL HIGH (ref 70–99)
Glucose-Capillary: 136 mg/dL — ABNORMAL HIGH (ref 70–99)
Glucose-Capillary: 146 mg/dL — ABNORMAL HIGH (ref 70–99)
Glucose-Capillary: 158 mg/dL — ABNORMAL HIGH (ref 70–99)
Glucose-Capillary: 190 mg/dL — ABNORMAL HIGH (ref 70–99)

## 2024-02-19 LAB — COOXEMETRY PANEL
Carboxyhemoglobin: 1.6 % — ABNORMAL HIGH (ref 0.5–1.5)
Methemoglobin: <0.7 % (ref 0.0–1.5)
O2 Saturation: 78.3 %
Total hemoglobin: 9.6 g/dL — ABNORMAL LOW (ref 12.0–16.0)

## 2024-02-19 LAB — HEPARIN LEVEL (UNFRACTIONATED): Heparin Unfractionated: >1.1 [IU]/mL — ABNORMAL HIGH (ref 0.30–0.70)

## 2024-02-19 MED ORDER — INSULIN ASPART 100 UNIT/ML IJ SOLN
0.0000 [IU] | Freq: Three times a day (TID) | INTRAMUSCULAR | Status: DC
  Administered 2024-02-19 – 2024-02-21 (×3): 1 [IU] via SUBCUTANEOUS

## 2024-02-19 MED ORDER — INSULIN ASPART 100 UNIT/ML IJ SOLN
2.0000 [IU] | Freq: Three times a day (TID) | INTRAMUSCULAR | Status: DC
  Administered 2024-02-19 – 2024-02-21 (×5): 2 [IU] via SUBCUTANEOUS

## 2024-02-19 MED ORDER — HEPARIN (PORCINE) 25000 UT/250ML-% IV SOLN
1400.0000 [IU]/h | INTRAVENOUS | Status: DC
  Administered 2024-02-19: 1400 [IU]/h via INTRAVENOUS
  Filled 2024-02-19: qty 250

## 2024-02-19 MED ORDER — POTASSIUM CHLORIDE 10 MEQ/50ML IV SOLN: 10.0000 meq | INTRAVENOUS | Status: DC

## 2024-02-19 MED ORDER — HEPARIN (PORCINE) 25000 UT/250ML-% IV SOLN
700.0000 [IU]/h | INTRAVENOUS | Status: DC
  Administered 2024-02-19: 1100 [IU]/h via INTRAVENOUS
  Filled 2024-02-19: qty 250

## 2024-02-19 MED ORDER — CHLORHEXIDINE GLUCONATE CLOTH 2 % EX PADS
6.0000 | MEDICATED_PAD | Freq: Every day | CUTANEOUS | Status: DC
Start: 2024-02-19 — End: 2024-02-19
  Administered 2024-02-19: 6 via TOPICAL

## 2024-02-19 NOTE — Progress Notes (Signed)
 Calorie Count Note  48 hour calorie count ordered and now complete. Results below. Noted, patient mentation and intake greatly improved. Also noted to have refused Cortrak placement Monday. After review of calorie count, can proceed without replacement of Cortrak. Continue current interventions and encourage PO and supplement consumption to augment intake.    Diet: Regular, Thin liquids Supplements: Ensure Plus High Protein, Magic Cup  Estimated Nutritional Needs:  Kcal:  2200-2400 kcals Protein:  120-140 gm Fluid:  1000 mL plus UOP   6/10 Dinner: 500 kcals, 18g protein 6/11 Breakfast: 595 kcals, 28g protein 6/11 Lunch: 475 kcals, 18g protein Supplements: 0 kcals, 0g protein   Total intake DAY 2: 1570 kcal (71% minimum estimated needs) 64g protein (53% of minimum estimated needs)  Total intake DAY 1: 1375 kcal (63% of minimum estimated needs)  49g protein (41% of minimum estimated needs)   Nutrition Diagnosis: Moderate Malnutrition related to chronic illness as evidenced by moderate fat depletion, moderate muscle depletion, edema - remains applicable   Goal: Patient will meet greater than or equal to 90% of their needs - progressing   Intervention:  Ensure Plus High Protein po BID, each supplement provides 350 kcal and 20 grams of protein Magic cup TID with meals, each supplement provides 290 kcal and 9 grams of protein Renal Multivitamin w/ minerals daily  Con Decant MS, RD, LDN Registered Dietitian Clinical Nutrition RD Inpatient Contact Info in Amion

## 2024-02-19 NOTE — TOC Progression Note (Addendum)
 Transition of Care [facility]) - Progression Note    Patient Details  Name: [name] MRN: [id number] Date of Birth: 03/06/1956  [facility]) CM/SW Contact  Benjiman Bras, RN Phone Number: [phone] 02/19/2024, 11:15 AM  Clinical Narrative:     TOC CM spoke to pt at bedside. Pt gave permission to speak to wife. Pt is a resident [facility] SNF. Will continue to follow for dc back to facility when medically stable.   Expected Discharge Plan: Skilled Nursing Facility Barriers to Discharge: Continued Medical Work up  Expected Discharge Plan and Services   Discharge Planning Services: CM Consult Post Acute Care Choice: Skilled Nursing Facility Living arrangements for the past 2 months: Skilled Nursing Facility                                       Social Determinants of Health (SDOH) Interventions SDOH Screenings   Food Insecurity: No Food Insecurity (02/14/2024)  Housing: Low Risk  (02/14/2024)  Transportation Needs: No Transportation Needs (02/14/2024)  Utilities: Not At Risk (02/14/2024)  Social Connections: Moderately Isolated (02/14/2024)  Tobacco Use: High Risk (02/11/2024)    Readmission Risk Interventions    05/29/2023    4:41 PM 06/30/2021   11:53 AM  Readmission Risk Prevention Plan  Transportation Screening Complete Complete  Medication Review Oceanographer) Complete Complete  PCP or Specialist appointment within 3-5 days of discharge Complete   HRI or Home Care Consult Complete Complete  SW Recovery Care/Counseling Consult Complete Complete  Palliative Care Screening Not Applicable Not Applicable  Skilled Nursing Facility Complete Not Applicable

## 2024-02-19 NOTE — Progress Notes (Signed)
 [name] NEPHROLOGY PROGRESS NOTE  Assessment/ Plan: Pt is a 68 y.o. yo male   OP HD: [facility] 4h  B450   107kg   AVF  Heparin  none Last OP HD 5/31, post wt 112.5kg Partial HD 6/03, post wt 116.5kg (up 9kg), 1.5hr session, sent to ed Hectorol  2 mcg iv three times per week  # Pericardial effusion/ tamponade: s/p pericardiocentesis 6/05 per cardiology.  # Shock: off levo and vaso.  On midodrine  now, monitor BP.   # ESRD: usual HD TTS. CRRT 6/5-6/8.  Tolerated dialysis well yesterday with almost 2 L UF, next HD tomorrow.  Likely transfer to floor today.  # Anemia of esrd: Hb 9-11  here, follow.   # Secondary hyperparathyroidism: CCa in range, phos low d/t CRRT.  Currently on sevelamer  as he started eating.   #PAF: on eliquis  #CAD: sp PCI w/ DES in march 2025   Subjective: Seen and examined.  No nausea, vomiting, chest pain or shortness of breath. Objective Vital signs in last 24 hours: Vitals:   02/19/24 0744 02/19/24 0800 02/19/24 0900 02/19/24 1002  BP:  (!) 124/56 (!) 97/54 101/75  Pulse:   69   Resp:  (!) 21 15   Temp:  98.4 F (36.9 C)    TempSrc:  Oral    SpO2: 97%  98%   Weight:    110.5 kg  Height:       Weight change:   Intake/Output Summary (Last 24 hours) at 02/19/2024 1102 Last data filed at 02/19/2024 0700 Gross per 24 hour  Intake 250 ml  Output 2025 ml  Net -1775 ml       Labs: RENAL PANEL Recent Labs  Lab 02/13/24 0518 02/13/24 0840 02/14/24 0440 02/14/24 1536 02/15/24 0330 02/15/24 1609 02/16/24 0429 02/17/24 0636 02/18/24 0802  NA 135   < > 136 137 135 135 136 135 136  K 5.4*   < > 4.3 4.2 3.9 3.9 3.9 3.8 4.2  CL 97*   < > 98 100 102 102 103 104 104  CO2 19*   < > 24 22 24 25 24 22 22   GLUCOSE 187*   < > 181* 182* 242* 245* 191* 143* 115*  BUN 45*   < > 34* 29* 28* 26* 22 29* 40*  CREATININE 5.23*   < > 3.53* 2.96* 2.60* 2.24* 1.91* 2.81* 4.23*  CALCIUM  9.1   < > 8.6* 8.7* 8.9 8.7* 8.5* 7.8* 8.0*  MG 2.5*   --  2.1  --  2.0  --  2.2  --  2.3  PHOS 6.6*   < > 4.2 3.4 2.5 1.9* 2.3*  --  3.5  ALBUMIN  3.4*   < > 2.8* 2.6* 2.5* 2.6* 2.7*  --  2.4*   < > = values in this interval not displayed.    Liver Function Tests: Recent Labs  Lab 02/15/24 1609 02/16/24 0429 02/18/24 0802  ALBUMIN  2.6* 2.7* 2.4*   No results for input(s): LIPASE, AMYLASE in the last 168 hours. No results for input(s): AMMONIA in the last 168 hours. CBC: Recent Labs    03/30/23 1922 03/31/23 0314 05/28/23 1748 05/29/23 0419 02/15/24 0330 02/15/24 1145 02/16/24 0429 02/17/24 0636 02/18/24 0802  HGB  --    < >  --    < > 8.5* 8.6* 8.7* 8.9* 9.0*  MCV  --    < >  --    < > 92.9  --  93.2 94.0 95.4  VITAMINB12 432  --  411  --   --   --   --   --   --   FOLATE >40.0  --   --   --   --   --   --   --   --   FERRITIN 939*  --   --   --   --   --   --   --   --   TIBC 224*  --   --   --   --   --   --   --   --   IRON 81  --   --   --   --   --   --   --   --   RETICCTPCT 1.3  --   --   --   --   --   --   --   --    < > = values in this interval not displayed.    Cardiac Enzymes: No results for input(s): CKTOTAL, CKMB, CKMBINDEX, TROPONINI in the last 168 hours. CBG: Recent Labs  Lab 02/18/24 1529 02/18/24 2018 02/18/24 2339 02/19/24 0433 02/19/24 0854  GLUCAP 133* 236* 204* 112* 146*    Iron Studies: No results for input(s): IRON, TIBC, TRANSFERRIN, FERRITIN in the last 72 hours. Studies/Results: ECHOCARDIOGRAM LIMITED Result Date: 02/17/2024    ECHOCARDIOGRAM LIMITED REPORT   Patient Name:   [name] Date of Exam: 02/17/2024 Medical Rec #:  [id number]     Height:       76.0 in Accession #:    [id number]    Weight:       243.6 lb Date of Birth:  [birth year]     BSA:          2.409 m Patient Age:    67 years      BP:           116/49 mmHg Patient Gender: M             HR:           99 bpm. Exam Location:  Inpatient Procedure: Limited Echo (Both Spectral and Color Flow Doppler were  utilized            during procedure). Indications:    Pericardial effusion I31.3  History:        Patient has prior history of Echocardiogram examinations, most                 recent 02/14/2024. COPD; Risk Factors:Hypertension.  Sonographer:    Astrid Blamer Referring Phys: [id number] [place] LEE IMPRESSIONS  1. No evidence of pericardial effusion. Moderate pleural effusion.  2. Left ventricular ejection fraction, by estimation, is 50 to 55%. The left ventricle has low normal function.  3. The mitral valve is abnormal. Severe mitral annular calcification.  4. The aortic valve is calcified. FINDINGS  Left Ventricle: Left ventricular ejection fraction, by estimation, is 50 to 55%. The left ventricle has low normal function. Pericardium: No evidence of pericardial effusion. There is no evidence of pericardial effusion. Mitral Valve: The mitral valve is abnormal. Severe mitral annular calcification. Aortic Valve: The aortic valve is calcified. Additional Comments: There is a moderate pleural effusion.  Arta Lark Electronically signed by Arta Lark Signature Date/Time: 02/17/2024/1:55:52 PM    Final     Medications: Infusions:  anticoagulant sodium citrate       Scheduled Medications:  arformoterol   15 mcg Nebulization BID   atorvastatin   80 mg Oral QHS   Chlorhexidine  Gluconate Cloth  6 each Topical Q0600   Chlorhexidine  Gluconate Cloth  6 each Topical Q0600   clopidogrel   75 mg Oral Q breakfast   colchicine  0.3 mg Oral Daily   feeding supplement  237 mL Oral BID BM   gabapentin   100 mg Oral QHS   insulin  aspart  0-6 Units Subcutaneous TID WC   insulin  aspart  2 Units Subcutaneous TID WC   insulin  glargine-yfgn  7 Units Subcutaneous BID   midodrine   10 mg Oral Q8H   multivitamin  1 tablet Oral QHS   revefenacin   175 mcg Nebulization Daily   rOPINIRole   2 mg Oral QHS   sevelamer  carbonate  1,600 mg Oral TID WC   thiamine   100 mg Oral Daily   vancomycin   125 mg Oral Daily    have reviewed  scheduled and prn medications.  Physical Exam: General:NAD, comfortable Heart:RRR, s1s2 nl Lungs:clear b/l, no crackle Abdomen:soft, Non-tender, non-distended Extremities:No edema Dialysis Access: AV fistula.  Kelce Bouton [place] 02/19/2024,11:02 AM  LOS: 8 days

## 2024-02-19 NOTE — Progress Notes (Addendum)
 PHARMACY - ANTICOAGULATION CONSULT NOTE  Pharmacy Consult for heparin  Indication: atrial fibrillation  Allergies  Allergen Reactions   Contrast Media [Iodinated Contrast Media] Shortness Of Breath and Other (See Comments)    Difficulty breathing and altered mental status     Ivp Dye [Iodinated Contrast Media] Anaphylaxis, Shortness Of Breath and Other (See Comments)    Breathing problems, altered mental state    Latex Rash and Other (See Comments)    A severe rash appears after the first 24 hours of being placed   Tape Other (See Comments) and Rash    Rash after 1 day of use  Do not leave on longer then 3 days with out be changed    Patient Measurements: Height: 6' 4 (193 cm) Weight: 110.5 kg (243 lb 9.7 oz) IBW/kg (Calculated) : 86.8 HEPARIN  DW (KG): 110  Vital Signs: Temp: 98.5 F (36.9 C) (06/11 1550) Temp Source: Oral (06/11 1100) BP: 119/61 (06/11 1550) Pulse Rate: 66 (06/11 1550)  Labs: Recent Labs    02/17/24 0636 02/18/24 0802 02/19/24 1111 02/19/24 1822  HGB 8.9* 9.0* 9.3*  --   HCT 28.3* 29.0* 29.1*  --   PLT 145* 140* 120*  --   HEPARINUNFRC  --   --   --  >1.10*  CREATININE 2.81* 4.23* 3.14*  --     Estimated Creatinine Clearance: 30.2 mL/min (A) (by C-G formula based on SCr of 3.14 mg/dL (H)).   Medical History: Past Medical History:  Diagnosis Date   Carotid artery occlusion 11/10/2010   LEFT CAROTID ENDARTERECTOMY   Complication of anesthesia    BP WENT UP AT [facility]    COPD (chronic obstructive pulmonary disease) (HCC)    pt denies this dx as of 06/01/20 - no inhaler    Diabetes mellitus without complication (HCC)    Diverticulitis    Diverticulosis of colon (without mention of hemorrhage)    DJD (degenerative joint disease)    knees/hands/feet/back/neck   ESRD (end stage renal disease) on dialysis (HCC)    Fatty liver    Full dentures    GERD (gastroesophageal reflux disease)    H/O hiatal hernia    History of blood transfusion     with a past surical procedure per patient 06/01/20   Hyperlipidemia    Hypertension    Neuromuscular disorder (HCC)    peripheral neuropathy   Non-pressure chronic ulcer of other part of left foot limited to breakdown of skin (HCC) 11/12/2016   Osteomyelitis (HCC)    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 Atherectomy left popliteal artery July 2012.    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 Previously transitioned from Eliquis  to IV heparin  this admission but was discontinued s/p emergent pericardiocentesis 6/5 due to large amount of  hemorrhagic fluid amd placement of pericardial drain. Patient now stable, pericardial drain out 6/8 and since no signs/symptoms of bleeding. Hgb 9.3, Plt 120, low stable. Pharmacy consulted to restart IV heparin .  Due to recent pericardial bleeding will start heparin  infusion  without bolus, lower initial therapeutic goal and titrate conservatively to ensure he tolerates.  6/11 PM - initial heparin  level >1.1 on 1400 units/hr.  Restricted L arm access, heparin  running through R PIV upper arm and drawn from R hand.  No bleeding noted.  Goal of Therapy:  Heparin  level 0.3-0.5 units/ml Monitor platelets by anticoagulation protocol: Yes   Plan:  Hold heparin  x 1 h Restart at reduced rate of heparin  IV 1100 units/hr 8 hour heparin  level Continue to monitor CBC and for signs/symptoms of bleeding  Cecillia Cogan, PharmD Clinical Pharmacist 02/19/2024  7:31 PM

## 2024-02-19 NOTE — Progress Notes (Signed)
 NAME:  [name], MRN:  [id number], DOB:  Mar 07, 1956, LOS: 8 ADMISSION DATE:  02/11/2024, CONSULTATION DATE:  02/12/2024 REFERRING MD:  Arbie Knock CHIEF COMPLAINT:  hypotension   History of Present Illness:  68 year old male with PMH of COPD, insulin -dependent T2DM, GERD, CAD, PAD, HLD, HTN, OSA, ESRD on HD TTS, recent admission for Cdiff colitis who presented to the emergency department on 6/3 weakness and hypotension, decreased PO intake. He was reportedly at dialysis when he became hypotensive after 1L removed to 80s systolic. In ED, Na 133, Cl 93, BUN 41/sCr 6.26, AG 16, WBC 13.0, hgb 10.5, trop 44, lactic 2.4. In ED he received 1L IVF, midodrine  with improvement in his BP. He was admitted to hospitalist. Hospitalist gave albumin  25g x2.   Pertinent  Medical History  COPD, insulin -dependent T2DM, GERD, CAD, PAD, HLD, HTN, OSA, ESRD on HD TTS, recent admission for Cdiff colitis   Significant Hospital Events: Including procedures, antibiotic start and stop dates in addition to other pertinent events   6/3 admitted after presenting for hypotension 6/4 rapid response called for bradycardic episodes and hypotension. PCCM consulted.  6/5 pericardial drain; initial drainage 1280cc, 570cc out in subsequent <24 hrs. TVP placed. 6/8 TVP, pericardial drain d/c. Self-d/c cortrak.  Interim History / Subjective:  Tolerated iHd this morning with 1.8L removed.   Objective    Blood pressure 101/75, pulse 69, temperature 98.4 F (36.9 C), temperature source Oral, resp. rate 15, height 6' 4 (1.93 m), weight 110.5 kg, SpO2 98%. CVP:  [1 mmHg-14 mmHg] 9 mmHg      Intake/Output Summary (Last 24 hours) at 02/19/2024 1043 Last data filed at 02/19/2024 0700 Gross per 24 hour  Intake 250 ml  Output 2025 ml  Net -1775 ml   Filed Weights   02/16/24 0500 02/19/24 0100 02/19/24 1002  Weight: 110.5 kg 110.5 kg 110.5 kg    Examination: General: chronically ill appearing man lying in bed in NAD watching  cartoons Neck: no erythema around HD line HEENT: [place]/AT, eyes anicteric Neuro: awake, watching TV, somewhat interactive.  Chest: breathing comfortably on RA, CTAB anteriorly, reduced basilar breath sounds Heart: S1S2, reg rate, irreg rhythm Abdomen: obese, soft, NT Extremities:  improving edema.      Pericardial fluid: 6750 WBC, 88% PMNs, 6% lymphs  [place] 78%  Resolved Hospital Problem list     Assessment & Plan:  Combined shock-  septic due to right lower lobe pneumonia, obstructive shock, due to large pericardial effusion with cardiac tamponade> relieved by pericardial drain. Drain removed. Lactic acidosis due to shock -start heparin  today -plavix , con't holding aspirin  -completed course of antibiotics -has been off pressors for 2-3 days -Etiology of pericarditis and effusion unknown-- suspect HD, but came in with ok BUN. Possibly serositis from hydralazine - DILE? 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  -continue midodrine  10mg  TID  Symptomatic bradycardia in the setting of AV nodal blocking agents with beta-blocker and amiodarone ; s/p TVP Rate controlled Afib Coronary Artery Disease s/p stent to ostial/proximal RCA and mid RCA, PCI proximal RCA 11/2023 Paroxysmal atrial fibrillation , currently rate controlled  - con't plavix  and statin, holding aspirin  to ensure no recurrent bleeding  -has not needed rate control; keep off Bblocker and amiodarone  due to pauses and previous heart block requiring RVP -holding PTA antihypertensives -stable off pressors  Recent C. Diff Colitis- during last admission, he was on vancomycin  which was supposed to be completed on 6/4. -complete 5 days of oral vanc  after stopping systemic antibiotics -enteric precautions  Acute respiratory failure with hypoxia Right lower lobe pneumonia Bilateral pleural effusions> likely 2/2 ESRD. L>R, both dependent. -pulmonary hygiene - he declined thora on the left; con't  using iHD to pull fluid as tolerated  COPD, not acutely exacerbated -brovana , yupelri   ESRD  Hyperkalemia; resolved -iHD this morning -renal diet -renally dose meds, avoid nephrotoxic meds  DMII with uncontrolled hyperglycemia -Continue glargine 7 units twice daily -SSI PRN- change to [facility] -add aspart 2 units TIDAC for meal coverage -goal BG 140-180  Concern for dysphagia Moderate protein energy malnutrition; self discontinued his cortrak today -kcal count, he refused cortrak -renal diet  PAD -plavix , statin  Anemia due to ESRD; 3g drop in Hb in the past few days likely due to hemorrhagic pericarditis -transfuse for Hb <7 or hemodynamically significant bleeding  Prolonged Qtc (even corrected for BBB) -tele monitoring -monitor electrolytes  GG pulmonary nodule RUL; incidental finding  -needs OP follow up CT in 6-12 months   Palliative care consulted - complex disease, baseline disability, recurrent hospitalizations.   No family at bedside today.  Potentially stable for transfer out of ICU today if Nephrology ok with HD in dialysis unit moving forward.   Best Practice (right click and Reselect all SmartList Selections daily)   Diet/type: dysphagia diet (see orders) DVT prophylaxis: heparin  gtt Pressure ulcer(s). Clinically undetermined if the pressure ulcer was present on admission, see nursing notes GI prophylaxis: N/A Lines: Central line and Arterial Line-hopefully can d/c temp HD catheter today Foley:  N/A Code Status:  full code Last date of multidisciplinary goals of care discussion [Discussed with patient, he wishes to be full code]  Labs   CBC: Recent Labs  Lab 02/14/24 1202 02/15/24 0330 02/15/24 1145 02/16/24 0429 02/17/24 0636 02/18/24 0802  WBC 20.3* 16.0*  --  11.3* 7.8 8.0  HGB 8.9* 8.5* 8.6* 8.7* 8.9* 9.0*  HCT 28.3* 27.3* 27.0* 27.4* 28.3* 29.0*  MCV 93.1 92.9  --  93.2 94.0 95.4  PLT 284 245  --  183 145* 140*    Basic Metabolic  Panel: Recent Labs  Lab 02/13/24 0518 02/13/24 0840 02/14/24 0440 02/14/24 1536 02/15/24 0330 02/15/24 1609 02/16/24 0429 02/17/24 0636 02/18/24 0802  NA 135   < > 136 137 135 135 136 135 136  K 5.4*   < > 4.3 4.2 3.9 3.9 3.9 3.8 4.2  CL 97*   < > 98 100 102 102 103 104 104  CO2 19*   < > 24 22 24 25 24 22 22   GLUCOSE 187*   < > 181* 182* 242* 245* 191* 143* 115*  BUN 45*   < > 34* 29* 28* 26* 22 29* 40*  CREATININE 5.23*   < > 3.53* 2.96* 2.60* 2.24* 1.91* 2.81* 4.23*  CALCIUM  9.1   < > 8.6* 8.7* 8.9 8.7* 8.5* 7.8* 8.0*  MG 2.5*  --  2.1  --  2.0  --  2.2  --  2.3  PHOS 6.6*   < > 4.2 3.4 2.5 1.9* 2.3*  --  3.5   < > = values in this interval not displayed.   GFR: Estimated Creatinine Clearance: 22.4 mL/min (A) (by C-G formula based on SCr of 4.23 mg/dL (H)). Recent Labs  Lab 02/12/24 1709 02/13/24 0518 02/13/24 0844 02/13/24 1149 02/13/24 1537 02/13/24 1544 02/14/24 0440 02/15/24 0330 02/16/24 0429 02/17/24 0636 02/18/24 0802  WBC  --    < >  --   --    < >  --    < >  16.0* 11.3* 7.8 8.0  LATICACIDVEN 2.4*  --  12.4* 13.4*  --  5.8*  --   --   --   --   --    < > = values in this interval not displayed.      Joesph Mussel, DO 02/19/24 10:59 AM [facility]  For contact information, see Amion. If no response to pager, please call PCCM consult pager. After hours, 7PM- 7AM, please call Elink.

## 2024-02-19 NOTE — Plan of Care (Signed)
  Problem: Education: Goal: Ability to describe self-care measures that may prevent or decrease complications (Diabetes Survival Skills Education) will improve Outcome: Progressing Goal: Individualized Educational Video(s) Outcome: Progressing   Problem: Coping: Goal: Ability to adjust to condition or change in health will improve Outcome: Progressing   Problem: Fluid Volume: Goal: Ability to maintain a balanced intake and output will improve Outcome: Progressing   Problem: Health Behavior/Discharge Planning: Goal: Ability to identify and utilize available resources and services will improve Outcome: Progressing Goal: Ability to manage health-related needs will improve Outcome: Progressing   Problem: Metabolic: Goal: Ability to maintain appropriate glucose levels will improve Outcome: Progressing   Problem: Nutritional: Goal: Maintenance of adequate nutrition will improve Outcome: Progressing Goal: Progress toward achieving an optimal weight will improve Outcome: Progressing   Problem: Skin Integrity: Goal: Risk for impaired skin integrity will decrease Outcome: Progressing   Problem: Tissue Perfusion: Goal: Adequacy of tissue perfusion will improve Outcome: Progressing   Problem: Education: Goal: Knowledge of General Education information will improve Description: Including pain rating scale, medication(s)/side effects and non-pharmacologic comfort measures Outcome: Progressing   Problem: Health Behavior/Discharge Planning: Goal: Ability to manage health-related needs will improve Outcome: Progressing   Problem: Clinical Measurements: Goal: Ability to maintain clinical measurements within normal limits will improve Outcome: Progressing Goal: Will remain free from infection Outcome: Progressing Goal: Diagnostic test results will improve Outcome: Progressing Goal: Respiratory complications will improve Outcome: Progressing Goal: Cardiovascular complication will  be avoided Outcome: Progressing   Problem: Activity: Goal: Risk for activity intolerance will decrease Outcome: Progressing   Problem: Nutrition: Goal: Adequate nutrition will be maintained Outcome: Progressing   Problem: Coping: Goal: Level of anxiety will decrease Outcome: Progressing   Problem: Elimination: Goal: Will not experience complications related to bowel motility Outcome: Progressing Goal: Will not experience complications related to urinary retention Outcome: Progressing   Problem: Pain Managment: Goal: General experience of comfort will improve and/or be controlled Outcome: Progressing   Problem: Safety: Goal: Ability to remain free from injury will improve Outcome: Progressing   Problem: Skin Integrity: Goal: Risk for impaired skin integrity will decrease Outcome: Progressing   Problem: Education: Goal: Understanding of CV disease, CV risk reduction, and recovery process will improve Outcome: Progressing Goal: Individualized Educational Video(s) Outcome: Progressing   Problem: Activity: Goal: Ability to return to baseline activity level will improve Outcome: Progressing   Problem: Cardiovascular: Goal: Ability to achieve and maintain adequate cardiovascular perfusion will improve Outcome: Progressing Goal: Vascular access site(s) Level 0-1 will be maintained Outcome: Progressing   Problem: Health Behavior/Discharge Planning: Goal: Ability to safely manage health-related needs after discharge will improve Outcome: Progressing   Problem: Education: Goal: Understanding of CV disease, CV risk reduction, and recovery process will improve Outcome: Progressing Goal: Individualized Educational Video(s) Outcome: Progressing   Problem: Activity: Goal: Ability to return to baseline activity level will improve Outcome: Progressing   Problem: Cardiovascular: Goal: Ability to achieve and maintain adequate cardiovascular perfusion will improve Outcome:  Progressing Goal: Vascular access site(s) Level 0-1 will be maintained Outcome: Progressing   Problem: Health Behavior/Discharge Planning: Goal: Ability to safely manage health-related needs after discharge will improve Outcome: Progressing

## 2024-02-19 NOTE — Progress Notes (Signed)
 PHARMACY - ANTICOAGULATION CONSULT NOTE  Pharmacy Consult for heparin  Indication: atrial fibrillation  Allergies  Allergen Reactions   Contrast Media [Iodinated Contrast Media] Shortness Of Breath and Other (See Comments)    Difficulty breathing and altered mental status     Ivp Dye [Iodinated Contrast Media] Anaphylaxis, Shortness Of Breath and Other (See Comments)    Breathing problems, altered mental state    Latex Rash and Other (See Comments)    A severe rash appears after the first 24 hours of being placed   Tape Other (See Comments) and Rash    Rash after 1 day of use  Do not leave on longer then 3 days with out be changed    Patient Measurements: Height: 6' 4 (193 cm) Weight: 110.5 kg (243 lb 9.7 oz) IBW/kg (Calculated) : 86.8 HEPARIN  DW (KG): 110  Vital Signs: Temp: 98.4 F (36.9 C) (06/11 0800) Temp Source: Oral (06/11 0800) BP: 101/75 (06/11 1002) Pulse Rate: 69 (06/11 0900)  Labs: Recent Labs    02/17/24 0636 02/18/24 0802  HGB 8.9* 9.0*  HCT 28.3* 29.0*  PLT 145* 140*  CREATININE 2.81* 4.23*    Estimated Creatinine Clearance: 22.4 mL/min (A) (by C-G formula based on SCr of 4.23 mg/dL (H)).   Medical History: Past Medical History:  Diagnosis Date   Carotid artery occlusion 11/10/2010   LEFT CAROTID ENDARTERECTOMY   Complication of anesthesia    BP WENT UP AT [facility]    COPD (chronic obstructive pulmonary disease) (HCC)    pt denies this dx as of 06/01/20 - no inhaler    Diabetes mellitus without complication (HCC)    Diverticulitis    Diverticulosis of colon (without mention of hemorrhage)    DJD (degenerative joint disease)    knees/hands/feet/back/neck   ESRD (end stage renal disease) on dialysis (HCC)    Fatty liver    Full dentures    GERD (gastroesophageal reflux disease)    H/O hiatal hernia    History of blood transfusion    with a past surical procedure per patient 06/01/20   Hyperlipidemia    Hypertension    Neuromuscular  disorder (HCC)    peripheral neuropathy   Non-pressure chronic ulcer of other part of left foot limited to breakdown of skin (HCC) 11/12/2016   Osteomyelitis (HCC)    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 Atherectomy left popliteal artery July 2012.    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 Previously transitioned from Eliquis  to IV heparin  this admission but was discontinued s/p emergent pericardiocentesis 6/5 due to large amount of  hemorrhagic fluid amd placement of pericardial drain. Patient now stable, pericardial drain out 6/8 and since no signs/symptoms of bleeding. Hgb 9.3, Plt 120, low stable. Pharmacy consulted to restart IV heparin .  Due to recent pericardial bleeding will start heparin  infusion without bolus, lower initial therapeutic goal and titrate conservatively to ensure he tolerates.  Goal of Therapy:  Heparin  level 0.3-0.5 units/ml Monitor platelets by anticoagulation protocol: Yes   Plan:  Start heparin  infusion at 1400 units/hr Check heparin  level level in 6 hours and daily while on heparin  Continue to monitor CBC and for signs/symptoms of bleeding  Jerrel Mor, PharmD PGY1 Pharmacy Resident 02/19/2024 10:36 AM

## 2024-02-19 NOTE — Consult Note (Signed)
 Palliative Medicine Inpatient Consult Note  Consulting Provider: Joesph Mussel, DO   Reason for consult:   Palliative Care Consult Services Palliative Medicine Consult  Reason for Consult? Bedbound, ESRD, multiple hospitalizations   02/19/2024  HPI:  Per intake H&P -->  68 year old male with PMH of COPD, insulin -dependent T2DM, GERD, CAD, PAD, HLD, HTN, OSA, ESRD on HD TTS, recent admission for Cdiff colitis who presented to the emergency department on 6/3 weakness and hypotension, decreased PO intake. He was reportedly at dialysis when he became hypotensive after 1L removed to 80s systolic. Palliative care has been asked to support additional goals of care conversations.   Clinical Assessment/Goals of Care:  *Please note that this is a verbal dictation therefore any spelling or grammatical errors are due to the Dragon Medical One system interpretation.  I have reviewed medical records including EPIC notes, labs and imaging, received report from bedside RN, assessed the patient who is lying in bed in NAD - alert and oriented.    I met with Shonta to further discuss diagnosis prognosis, GOC, EOL wishes, disposition and options.   I introduced Palliative Medicine as specialized medical care for people living with serious illness. It focuses on providing relief from the symptoms and stress of a serious illness. The goal is to improve quality of life for both the patient and the family.  Medical History Review and Understanding:  A review of patients past medical history inclusive of COPD, GERD, CAD, HLD, HTN, OSA, ESRD, and T2DM was completed.   Social History:  Jerauld is from [place], [place]. He and his wife, Jodi have been married for the past 36 years in September. He has three children from his first two marriages and his wife has two children from a prior marriage. He has one grandchild. He formerly worked as a Engineer, maintenance, Personnel officer, and Product/process development scientist. He use to like  hunting and fishing. He is a man of the Holiness faith.   Functional and Nutritional State:  Preceding hospitalization, Damon has lived at [facility] where he has been for over one year. He has required assistance with all bADLs. He hs a fair appetite.   Advance Directives:  A detailed discussion was had today regarding advanced directives.  Krishawn has not advanced directives on file, she shares with me that his knows what his wishes are.   Code Status:  Concepts specific to code status, artifical feeding and hydration, continued IV antibiotics and rehospitalization was had.  The difference between a aggressive medical intervention path  and a palliative comfort care path for this patient at this time was had.   Encouraged patient/family to consider DNR/DNI status understanding evidenced based poor outcomes in similar hospitalized patient, as the cause of arrest is likely associated with advanced chronic/terminal illness rather than an easily reversible acute cardio-pulmonary event. I explained that DNR/DNI does not change the medical plan and it only comes into effect after a person has arrested (died).  It is a protective measure to keep us  from harming the patient in their last moments of life.  As of presently, Elijah would want resuscitative efforts made to prolong life though he would not want to live on artificial measures indefinitely.    Provided Hard Choices for [employer] booklet.   Discussion:  Garrus and I discussed the events leading to his present hospitalization inclusive of his BP dropping while at [facility].   We discussed his prolonged hospital course inclusive of treatment for septic and cardiogenic  shock due to  c-diff, presumed LLL PNA, and a pericardial effusion. We discussed his pericardial effusion required drain placement.   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 He was doing fairly well preceding that and getting  around with a prosthetic and electric wheelchair. He shared to decline after his second amputation to the point where he had to be custodialized at a facility and is not predominately bed bound.   From the perspective of joy - Brok does enjoy seeing and speaking with his family. This does provide him with quality of life.    At this time, Damein is hoping to improve as his body is able.  ___________________  I called and spoke with Avanell Bob, Aravind's wife this afternoon. We discussed the above conversation(s). Avanell Bob feels for the most part what was shared is accurate. She feels that what she understands is that Valente could have a significant event at anytime which could take his life. She shares understanding of Zackeriah's cardiovascular disease and possible inability to tolerate dialysis in the future.   We reviewed dialysis treatments and if/when they get to a point where body;s are unable to tolerate them. Avanell Bob shares that she is hoping we can get together with either Dr. Berry Bristol or Dr. Christianne Cowper as Irwin Manual trusts each of them to further discuss patients prognosis. She would like to be present for this.  Discussed the importance of continued conversation with family and their  medical providers regarding overall plan of care and treatment options, ensuring decisions are within the context of the patients values and GOCs.  Decision Maker: Liebler,[name] Delores (Spouse): [phone] (Mobile)   SUMMARY OF RECOMMENDATIONS   Full Code / Full scope of care at this time -- Patient does not he would not want to live on a ventilator or artificial measures indefinitely  Patients wife is hopeful that a meeting can be arranged with Dr. Berry Bristol or Dr. Christianne Cowper to further support goals of care conversations as she understands his disease burden is great  Plan for ongoing PMT support  Code Status/Advance Care Planning: FULL CODE  Palliative Prophylaxis:  Aspiration, Bowel Regimen, Delirium Protocol, Frequent Pain Assessment, Oral  Care, Palliative Wound Care, and Turn Reposition  Additional Recommendations (Limitations, Scope, Preferences): Continue current care  Psycho-social/Spiritual:  Desire for further Chaplaincy support: Holiness denomination Additional Recommendations: Education on chronic disease burden   Prognosis: Limited overall.   Discharge Planning: Discharge plan to be determined.   Vitals:   02/19/24 0630 02/19/24 0700  BP: (!) 111/56 (!) 102/55  Pulse:    Resp: 16 16  Temp:    SpO2:      Intake/Output Summary (Last 24 hours) at 02/19/2024 0737 Last data filed at 02/19/2024 0700 Gross per 24 hour  Intake 250 ml  Output 2025 ml  Net -1775 ml   Last Weight  Most recent update: 02/19/2024  1:01 AM    Weight  110.5 kg (243 lb 9.7 oz)            Gen:  Chronically ill appearing elderly Caucasian M  HEENT: moist mucous membranes CV: Regular rate and rhythm  PULM:  On RA, breathing is even and nonlabored ABD: soft/nontender  EXT: BL BKA, (+) edema  Neuro: Alert and oriented x3   PPS: 30%   This conversation/these recommendations were discussed with patient primary care team, Dr. Fulton Job _____________________________ [facility] Palliative Medicine Team Team Cell Phone: [phone] Please utilize secure chat  with additional questions, if there is no response within 30 minutes please call the above phone number  Total Time: 75 Billing based on MDM: High  Palliative Medicine Team providers are available by phone from 7am to 7pm daily and can be reached through the team cell phone.  Should this patient require assistance outside of these hours, please call the patient's attending physician.

## 2024-02-19 NOTE — Progress Notes (Signed)
   02/19/24 0431  Vitals  Temp 98.3 F (36.8 C)  Temp Source Axillary  BP 116/65  MAP (mmHg) 80  BP Location Right Arm  BP Method Automatic  Patient Position (if appropriate) Lying  Pulse Rate 71  Pulse Rate Source Monitor  ECG Heart Rate 71  Resp (!) 21  During Treatment Monitoring  Blood Flow Rate (mL/min) 399 mL/min  Arterial Pressure (mmHg) -154.54 mmHg  Venous Pressure (mmHg) 204.23 mmHg  TMP (mmHg) 2.83 mmHg  Ultrafiltration Rate (mL/min) 1087 mL/min  Dialysate Flow Rate (mL/min) 300 ml/min  Dialysate Potassium Concentration 3  Dialysate Calcium  Concentration 2.5  Duration of HD Treatment -hour(s) 3.45 hour(s)  Cumulative Fluid Removed (mL) per Treatment  1849.19  HD Safety Checks Performed Yes  Intra-Hemodialysis Comments Tx completed  Post Treatment  Dialyzer Clearance Lightly streaked  Liters Processed 84  Fluid Removed (mL) 1900 mL  Tolerated HD Treatment Yes  AVG/AVF Arterial Site Held (minutes) 8 minutes  AVG/AVF Venous Site Held (minutes) 7 minutes  Fistula / Graft Left Upper arm Arteriovenous fistula  Placement Date/Time: 07/21/20 0825   Placed prior to admission: No  Orientation: Left  Access Location: Upper arm  Access Type: Arteriovenous fistula  Site Condition No complications  Fistula / Graft Assessment Present;Thrill;Bruit  Status Deaccessed  Needle Size 15  Drainage Description None  Hemodialysis Catheter Left Internal jugular Triple lumen Temporary (Non-Tunneled)  Placement Date/Time: 02/12/24 1700   Placed prior to admission: No  Orientation: Left  Access Location: Internal jugular  Hemodialysis Catheter Type: Triple lumen Temporary (Non-Tunneled)  Site Condition No complications  Dressing Type Transparent  Dressing Status Antimicrobial disc/dressing in place;Clean, Dry, Intact

## 2024-02-19 NOTE — Progress Notes (Addendum)
 Patient ID: [name], male   DOB: [birth year], 68 y.o.   MRN: [id number]     Advanced Heart Failure Rounding Note  Cardiologist: Knox Perl, MD  Chief Complaint: Pericardial Effusion Subjective:    6/5: tamponade s/p pericardiocentesis; started on CRRT  6/8: TVP and pericardial drain removed 6/9: Echo EF 45% effusion resolved.   Remains in SR overnight. Coox stable 78.  CRRT overnight 1.9L removed. Oliguric 125cc  Intermittently confused. Sitting up in bed, eating breakfast. Reports feeling back to usual self, although mentation is still questionable. No SOB. Baseline back pain.   Objective:    Weight Range: 110.5 kg Body mass index is 29.65 kg/m.   Vital Signs:   Temp:  [97.8 F (36.6 C)-98.8 F (37.1 C)] 98.4 F (36.9 C) (06/11 0800) Pulse Rate:  [61-77] 69 (06/11 0900) Resp:  [12-23] 15 (06/11 0900) BP: (90-136)/(37-101) 101/75 (06/11 1002) SpO2:  [95 %-100 %] 98 % (06/11 0900) Arterial Line BP: (88-122)/(34-48) 109/41 (06/11 0900) Weight:  [110.5 kg] 110.5 kg (06/11 1002) Last BM Date : 02/15/24  Weight change: Filed Weights   02/16/24 0500 02/19/24 0100 02/19/24 1002  Weight: 110.5 kg 110.5 kg 110.5 kg   Intake/Output:  Intake/Output Summary (Last 24 hours) at 02/19/2024 1050 Last data filed at 02/19/2024 0700 Gross per 24 hour  Intake 250 ml  Output 2025 ml  Net -1775 ml    Physical Exam   CVP 7 General: Haggard appearing. No distress on RA Cardiac: S1 and S2 present. No murmurs or rub. Extremities: Warm and dry.  1+ b/l thigh edema.  Neuro: Alert and oriented x3. Affect pleasant. Moves all extremities without difficulty. Lines/Devices:  RIJ HDL  Telemetry   SR in 70s (personally reviewed)  Labs    CBC Recent Labs    02/17/24 0636 02/18/24 0802  WBC 7.8 8.0  HGB 8.9* 9.0*  HCT 28.3* 29.0*  MCV 94.0 95.4  PLT 145* 140*   Basic Metabolic Panel Recent Labs    44/03/47 0636 02/18/24 0802  NA 135 136  K 3.8 4.2  CL 104 104  CO2 22  22  GLUCOSE 143* 115*  BUN 29* 40*  CREATININE 2.81* 4.23*  CALCIUM  7.8* 8.0*  MG  --  2.3  PHOS  --  3.5   Liver Function Tests Recent Labs    02/18/24 0802  ALBUMIN  2.4*   Medications:    Scheduled Medications:  arformoterol   15 mcg Nebulization BID   atorvastatin   80 mg Oral QHS   Chlorhexidine  Gluconate Cloth  6 each Topical Q0600   Chlorhexidine  Gluconate Cloth  6 each Topical Q0600   clopidogrel   75 mg Oral Q breakfast   colchicine  0.3 mg Oral Daily   feeding supplement  237 mL Oral BID BM   gabapentin   100 mg Oral QHS   insulin  aspart  0-6 Units Subcutaneous Q4H   insulin  glargine-yfgn  7 Units Subcutaneous BID   midodrine   10 mg Oral Q8H   multivitamin  1 tablet Oral QHS   revefenacin   175 mcg Nebulization Daily   rOPINIRole   2 mg Oral QHS   sevelamer  carbonate  1,600 mg Oral TID WC   thiamine   100 mg Oral Daily   vancomycin   125 mg Oral Daily   Infusions:  anticoagulant sodium citrate      PRN Medications: acetaminophen , alteplase , anticoagulant sodium citrate , atropine , [name]'s butt cream, heparin , heparin , oxyCODONE -acetaminophen  **OR** oxyCODONE -acetaminophen  **OR** HYDROmorphone  (DILAUDID ) injection **OR** HYDROmorphone  (DILAUDID ) injection, ipratropium-albuterol , lidocaine  (PF),  lidocaine -prilocaine , melatonin, mouth rinse, pentafluoroprop-tetrafluoroeth, polyethylene glycol, prochlorperazine   Assessment/Plan   1. Pericardial effusion: On 6/5 echo there was a large pericardial effusion with tamponade, pericardiocentesis with 1280 cc bloody fluid out.   - Pericardial drain removed 6/8. Cytology negative - Limited echo 6/9 EF 45% No significant effusion - Resume heparin , follow for signs of tamponade/re-accumulation  2. AF with tachy-brady syndrome: Patient was on Coreg  and amiodarone  at home. Noted to have long pauses up to 6 seconds on 6/4.  TVP placed but has not required. Removed 6/8.   - Off nodal blockers.  - Extremely poor candidate for device  therapy - Now in/out AF with post-conversion pauses (asymptomatic), Not candidate for amio with heart block on admit - In NSR this morning - Resume heparin    3. Shock: Hypotensive with HD prior to admission, improving slowly, now coming off vaso. - mixed septic/cardiogenic (tamponade) - continue midodrine  10 mg tid - now off pressors  4. ID: CXR with possible LLL PNA.  Afebrile.   - Broad spectrum coverage with vancomycin /cefepime  - improving L effusion noted on CXR.  - CCM managing   5. C difficile diarrhea: - CCM managing  6. ESRD: Off CRRT. iHD overnight, tolerated. Oliguric. Made 1L UOP with IV Lasix  160 on 6/9.  - Appreciate nephrology - continue iHD  7. CAD: NSTEMI 3/25 s/p PCI/DES LCX, PTCA/shockwave lithotripsy p RCA. No chest pain.  - Continue Plavix .  Has not been on ASA with anticoagulation.  - Continue atorvastatin .   [place] Lee, NP  02/19/2024, 10:50 AM  Advanced Heart Failure Team Pager [phone] (M-F; 7a - 5p)   Patient seen and examined with the above-signed Advanced Practice Provider and/or Housestaff. I personally reviewed laboratory data, imaging studies and relevant notes. I independently examined the patient and formulated the important aspects of the plan. I have edited the note to reflect any of my changes or salient points. I have personally discussed the plan with the patient and/or family.  Conversant but intermittently confused. Denies CP or SOB. Back in NSR. Heparin  restarted (was off due to pericardial effusion). No evidence of bleeding. Hemodynamically stable.   General:  Sitting up in bed . No resp difficulty HEENT: normal Neck: supple. no JVD. Carotids 2+ bilat; no bruits. No lymphadenopathy or thryomegaly appreciated. Cor: Regular rate & rhythm. No rubs, gallops or murmurs. Lungs: clear Abdomen: soft, nontender, nondistended. No hepatosplenomegaly. No bruits or masses. Good bowel sounds. Extremities: no cyanosis, clubbing, rash, edema s/p B  AKA Neuro: alert & orientedx3, cranial nerves grossly intact. moves all 4 extremities w/o difficulty. Affect pleasant  Stable from cardiac perspective. If tolerates heparin  today would switch to DOAC tomorrow. Now back in NSR. No candidate for amio or b-blocker with h/o CHB on admit.   Can go to floor. Will need repeat echo in 4 weeks  Tolerating HD  Palliative Care discussions ongoing   Jules Oar, MD  3:52 PM

## 2024-02-19 NOTE — TOC Progression Note (Addendum)
 Transition of Care [facility]) - Progression Note    Patient Details  Name: [name] MRN: [id number] Date of Birth: [birth year]  Transition of Care [facility] CM/SW Contact  Ernst Heap Phone Number: [phone] 02/19/2024, 2:51 PM  Clinical Narrative:   2:51 PM- HF CSW attempted to make contact with the patients wife by phone. [phone]- Several attempts have been made to this number. The mailbox is still full and I could not leave any messages.   2:53 PM- HF CSW received a call back from the patients wife who stated that the patient has been living at [facility] for a year. Patients wife stated that he has [facility] services through [facility] and receiving wound care, HHPT, and HHOT. Patients wife stated that he uses a wheelchair. Patients wife stated that he has a PCP. CSW explained that a hospital follow up appointment is typically scheduled closer towards dc.   TOC will continue following.     Expected Discharge Plan: Skilled Nursing Facility Barriers to Discharge: Continued Medical Work up  Expected Discharge Plan and Services   Discharge Planning Services: CM Consult Post Acute Care Choice: Skilled Nursing Facility Living arrangements for the past 2 months: Skilled Nursing Facility                                       Social Determinants of Health (SDOH) Interventions SDOH Screenings   Food Insecurity: No Food Insecurity (02/14/2024)  Housing: Low Risk  (02/14/2024)  Transportation Needs: No Transportation Needs (02/14/2024)  Utilities: Not At Risk (02/14/2024)  Social Connections: Moderately Isolated (02/14/2024)  Tobacco Use: High Risk (02/11/2024)    Readmission Risk Interventions    05/29/2023    4:41 PM 06/30/2021   11:53 AM  Readmission Risk Prevention Plan  Transportation Screening Complete Complete  Medication Review Oceanographer) Complete Complete  PCP or Specialist appointment within 3-5 days of discharge Complete   HRI or Home Care  Consult Complete Complete  SW Recovery Care/Counseling Consult Complete Complete  Palliative Care Screening Not Applicable Not Applicable  Skilled Nursing Facility Complete Not Applicable

## 2024-02-20 DIAGNOSIS — I2510 Atherosclerotic heart disease of native coronary artery without angina pectoris: Secondary | ICD-10-CM

## 2024-02-20 DIAGNOSIS — E440 Moderate protein-calorie malnutrition: Secondary | ICD-10-CM

## 2024-02-20 DIAGNOSIS — E785 Hyperlipidemia, unspecified: Secondary | ICD-10-CM

## 2024-02-20 DIAGNOSIS — Z7901 Long term (current) use of anticoagulants: Secondary | ICD-10-CM

## 2024-02-20 DIAGNOSIS — I314 Cardiac tamponade: Secondary | ICD-10-CM

## 2024-02-20 DIAGNOSIS — G2581 Restless legs syndrome: Secondary | ICD-10-CM

## 2024-02-20 LAB — CBC
HCT: 28.7 % — ABNORMAL LOW (ref 39.0–52.0)
Hemoglobin: 8.8 g/dL — ABNORMAL LOW (ref 13.0–17.0)
MCH: 29.2 pg (ref 26.0–34.0)
MCHC: 30.7 g/dL (ref 30.0–36.0)
MCV: 95.3 fL (ref 80.0–100.0)
Platelets: 121 10*3/uL — ABNORMAL LOW (ref 150–400)
RBC: 3.01 MIL/uL — ABNORMAL LOW (ref 4.22–5.81)
RDW: 18.3 % — ABNORMAL HIGH (ref 11.5–15.5)
WBC: 6.9 10*3/uL (ref 4.0–10.5)
nRBC: 0 % (ref 0.0–0.2)

## 2024-02-20 LAB — APTT
aPTT: 51 s — ABNORMAL HIGH (ref 24–36)
aPTT: 37 s — ABNORMAL HIGH (ref 24–36)

## 2024-02-20 LAB — GLUCOSE, CAPILLARY
Glucose-Capillary: 169 mg/dL — ABNORMAL HIGH (ref 70–99)
Glucose-Capillary: 121 mg/dL — ABNORMAL HIGH (ref 70–99)
Glucose-Capillary: 155 mg/dL — ABNORMAL HIGH (ref 70–99)
Glucose-Capillary: 130 mg/dL — ABNORMAL HIGH (ref 70–99)
Glucose-Capillary: 176 mg/dL — ABNORMAL HIGH (ref 70–99)
Glucose-Capillary: 181 mg/dL — ABNORMAL HIGH (ref 70–99)

## 2024-02-20 LAB — RENAL FUNCTION PANEL
Albumin: 2.3 g/dL — ABNORMAL LOW (ref 3.5–5.0)
Anion gap: 9 (ref 5–15)
BUN: 33 mg/dL — ABNORMAL HIGH (ref 8–23)
CO2: 26 mmol/L (ref 22–32)
Calcium: 7.6 mg/dL — ABNORMAL LOW (ref 8.9–10.3)
Chloride: 99 mmol/L (ref 98–111)
Creatinine, Ser: 4.24 mg/dL — ABNORMAL HIGH (ref 0.61–1.24)
GFR, Estimated: 14 mL/min — ABNORMAL LOW (ref >60)
Glucose, Bld: 109 mg/dL — ABNORMAL HIGH (ref 70–99)
Phosphorus: 4 mg/dL (ref 2.5–4.6)
Potassium: 4.4 mmol/L (ref 3.5–5.1)
Sodium: 134 mmol/L — ABNORMAL LOW (ref 135–145)

## 2024-02-20 LAB — HEPARIN LEVEL (UNFRACTIONATED)
Heparin Unfractionated: 1.02 [IU]/mL — ABNORMAL HIGH (ref 0.30–0.70)
Heparin Unfractionated: 0.89 [IU]/mL — ABNORMAL HIGH (ref 0.30–0.70)

## 2024-02-20 MED ORDER — NEPRO/CARBSTEADY PO LIQD: 237.0000 mL | Freq: Two times a day (BID) | ORAL | Status: DC

## 2024-02-20 MED ORDER — MIDODRINE HCL 5 MG PO TABS
7.5000 mg | ORAL_TABLET | Freq: Three times a day (TID) | ORAL | Status: DC
  Administered 2024-02-20 – 2024-02-21 (×2): 7.5 mg via ORAL
  Filled 2024-02-20 (×2): qty 2

## 2024-02-20 MED ORDER — DARBEPOETIN ALFA 100 MCG/0.5ML IJ SOSY
100.0000 ug | PREFILLED_SYRINGE | INTRAMUSCULAR | Status: DC
  Filled 2024-02-20: qty 0.5

## 2024-02-20 MED ORDER — PROSOURCE PLUS PO LIQD
30.0000 mL | Freq: Two times a day (BID) | ORAL | Status: DC
  Administered 2024-02-21: 30 mL via ORAL
  Filled 2024-02-20: qty 30

## 2024-02-20 NOTE — Progress Notes (Addendum)
 PROGRESS NOTE  [name]:[id number] DOB: April 22, 1956   PCP: Aloha Arnold, PA-C  Patient is from: LTC  DOA: 02/11/2024 LOS: 9  Chief complaints Chief Complaint  Patient presents with   Weakness   Hypotension     Brief Narrative / Interim history: 68 year old male with PMH of COPD, insulin -dependent T2DM, GERD, CAD, PAD, bilateral BKA, HLD, HTN, OSA, ESRD on HD TTS, recent admission for Cdiff colitis who presented to the emergency department on 6/3 weakness and hypotension, decreased PO intake. He was reportedly at dialysis when he became hypotensive after 1L removed to 80s systolic. In ED, Na 133, Cl 93, BUN 41/sCr 6.26, AG 16, WBC 13.0, hgb 10.5, trop 44, lactic 2.4. In ED he received 1L IVF, midodrine  with improvement in his BP. He was admitted to hospitalist. Hospitalist gave albumin  25g x2.   Patient had bradycardic episode and hypotension on 6/4 and PCCM consulted.  TTE on 6/5 with LVEF of 60 to 65%, severe concentric LVH, indeterminate DD, right ventricular pressure and volume overload, and large pericardial effusion with tamponade physiology.  Cardiology and advanced heart failure team consulted.  He underwent pericardiocentesis with removal of 1280 cc right away, and 570 cc in less than 24 hours.  He had TVP placed for bradycardia.  Eventually, TVP and pericardial drains discontinued on 6/8, and he was transferred to [facility] on 6/12.    Subjective: Seen and examined earlier this morning.  No major events overnight or this morning while on HD.  He is sleepy but wakes to voice.  No complaints.  Objective: Vitals:   02/20/24 1300 02/20/24 1330 02/20/24 1400 02/20/24 1409  BP: 130/78 135/88    Pulse: 66 66    Resp: (!) 24 17    Temp:   98.1 F (36.7 C)   TempSrc:   Oral   SpO2: 99% 98%    Weight:    108.6 kg  Height:        Examination:  GENERAL: No apparent distress.  Nontoxic. HEENT: MMM.  Vision and hearing grossly intact.  NECK: Supple.  No  apparent JVD.  RESP:  No IWOB.  Fair aeration bilaterally. CVS:  RRR. Heart sounds normal.  ABD/GI/GU: BS+. Abd soft, NTND.  MSK/EXT: Bilateral BKA SKIN: no apparent skin lesion or wound NEURO: AA.  Oriented appropriately.  No apparent focal neuro deficit. PSYCH: Calm. Normal affect.   Consultants:  Critical care Advanced heart failure team Cardiology  Procedures: 6/5-pericardiocentesis and TVP  Microbiology summarized: 6/5-MRSA PCR screen positive 6/4-strep A-negative 6/5-blood cultures NGTD  Assessment and plan: Combined shock-septic due to RLL pneumonia, obstructive from large pericardial effusion with cardiac tamponade Lactic acidosis due to shock -Plavix  resumed on 6/10 -Heparin  resumed on 6/11 -Aspirin  remains on hold. -Completed course of antibiotics - Decrease midodrine .  Severe pericardial effusion with tamponade: Unknown etiology of pericardial effusion. -Underwent pericardiocentesis with removal of 1280 cc right away, and 570 cc in less than 24 hours -Continue low-dose colchicine -Repeat echo in 4 to 6 weeks   Symptomatic bradycardia in the setting of beta-blocker and amiodarone ; s/p TVP Paroxysmal atrial fibrillation: Rate controlled. -Avoid nodal blocking agents. -On IV heparin  for anticoagulation -Optimize electrolytes -Continue telemetry monitoring  Coronary Artery Disease s/p stent to ostial/proximal RCA and mid RCA, PCI proximal RCA 11/2023 -Con't plavix  and statin, holding aspirin  to ensure no recurrent bleeding  -Avoid nodal blocking agents -Continue holding antihypertensive meds given   Recent C. Diff Colitis- during last admission, he was on vancomycin   which was supposed to be completed on 6/4. -Complete 5 days of oral vanc after stopping systemic antibiotics - Continue enteric precautions   Acute respiratory failure with hypoxia Right lower lobe pneumonia Bilateral pleural effusions> likely 2/2 ESRD. L>R, both dependent. -Pulmonary  hygiene -Declined thora on the left while in ICU; con't using iHD to pull fluid as tolerated   COPD, not acutely exacerbated -brovana , yupelri    ESRD  Hyperkalemia; resolved -iHD this morning -renal diet -renally dose meds, avoid nephrotoxic meds   DMII with uncontrolled hyperglycemia -Continue glargine 7 units twice daily -SSI-very sensitive- change to ACHS -Continue NovoLog  3 times daily with meals --Goal BG 140-180   Concern for dysphagia Moderate protein energy malnutrition; self discontinued his cortrak today -kcal count, he refused cortrak -renal diet   PAD/bilateral BKA -plavix , statin   Anemia due to ESRD; 3g drop in Hb in the past few days likely due to hemorrhagic pericarditis -transfuse for Hb <7 or hemodynamically significant bleeding   Prolonged Qtc (even corrected for BBB) -tele monitoring -monitor electrolytes   GG pulmonary nodule RUL; incidental finding  -needs OP follow up CT in 6-12 months   Moderate malnutrition Body mass index is 29.14 kg/m. Nutrition Problem: Moderate Malnutrition Etiology: chronic illness Signs/Symptoms: moderate fat depletion, moderate muscle depletion, edema Interventions: Refer to RD note for recommendations    DVT prophylaxis:  On full dose anticoagulation  Code Status: Full code Family Communication: None at bedside. Addendum-patient's wife updated over the phone Level of care: Telemetry Medical Status is: Inpatient Remains inpatient appropriate because: Shock, pericardial effusion, bradycardia/AV block   Final disposition: SNF   55 minutes with more than 50% spent in reviewing records, counseling patient/family and coordinating care.   Sch Meds:  Scheduled Meds:  (feeding supplement) PROSource Plus  30 mL Oral BID BM   arformoterol   15 mcg Nebulization BID   atorvastatin   80 mg Oral QHS   Chlorhexidine  Gluconate Cloth  6 each Topical Q0600   clopidogrel   75 mg Oral Q breakfast   colchicine  0.3 mg Oral  Daily   darbepoetin (ARANESP ) injection - DIALYSIS  100 mcg Subcutaneous Q Thu-1800   feeding supplement  237 mL Oral BID BM   feeding supplement (NEPRO CARB STEADY)  237 mL Oral BID BM   gabapentin   100 mg Oral QHS   insulin  aspart  0-6 Units Subcutaneous TID WC   insulin  aspart  2 Units Subcutaneous TID WC   insulin  glargine-yfgn  7 Units Subcutaneous BID   midodrine   10 mg Oral Q8H   multivitamin  1 tablet Oral QHS   revefenacin   175 mcg Nebulization Daily   rOPINIRole   2 mg Oral QHS   sevelamer  carbonate  1,600 mg Oral TID WC   thiamine   100 mg Oral Daily   vancomycin   125 mg Oral Daily   Continuous Infusions:  heparin  850 Units/hr (02/20/24 1156)   PRN Meds:.acetaminophen , atropine , [name]'s butt cream, oxyCODONE -acetaminophen  **OR** oxyCODONE -acetaminophen  **OR** HYDROmorphone  (DILAUDID ) injection **OR** HYDROmorphone  (DILAUDID ) injection, ipratropium-albuterol , melatonin, mouth rinse, polyethylene glycol, prochlorperazine   Antimicrobials: Anti-infectives (From admission, onward)    Start     Dose/Rate Route Frequency Ordered Stop   02/17/24 1100  ceFEPIme  (MAXIPIME ) 2 g in sodium chloride  0.9 % 100 mL IVPB        2 g 200 mL/hr over 30 Minutes Intravenous Every 24 hours 02/16/24 1332 02/18/24 1231   02/17/24 1100  vancomycin  (VANCOCIN ) 50 mg/mL oral solution SOLN 125 mg  125 mg Oral Daily 02/17/24 0846 02/23/24 2359   02/16/24 1328  vancomycin  variable dose per unstable renal function (pharmacist dosing)         Does not apply See admin instructions 02/16/24 1332 02/18/24 2359   02/14/24 1000  vancomycin  (VANCOCIN ) 50 mg/mL oral solution SOLN 125 mg  Status:  Discontinued        125 mg Oral Daily 02/13/24 0935 02/17/24 0846   02/13/24 1900  Vancomycin  (VANCOCIN ) 1,250 mg in sodium chloride  0.9 % 250 mL IVPB  Status:  Discontinued        1,250 mg 166.7 mL/hr over 90 Minutes Intravenous  Once 02/12/24 1634 02/12/24 1635   02/13/24 1900  Vancomycin  (VANCOCIN ) 1,250 mg  in sodium chloride  0.9 % 250 mL IVPB  Status:  Discontinued        1,250 mg 166.7 mL/hr over 90 Minutes Intravenous Every 24 hours 02/12/24 1635 02/16/24 1332   02/13/24 1100  ceFEPIme  (MAXIPIME ) 2 g in sodium chloride  0.9 % 100 mL IVPB  Status:  Discontinued        2 g 200 mL/hr over 30 Minutes Intravenous Every 12 hours 02/13/24 0830 02/16/24 1332   02/12/24 2200  vancomycin  (VANCOCIN ) 50 mg/mL oral solution SOLN 125 mg        125 mg Oral 4 times daily 02/12/24 1930 02/13/24 2359   02/12/24 1800  vancomycin  (VANCOCIN ) 50 mg/mL oral solution SOLN 125 mg  Status:  Discontinued        125 mg Per Tube 4 times daily 02/12/24 1544 02/12/24 1930   02/12/24 1800  piperacillin -tazobactam (ZOSYN ) IVPB 3.375 g  Status:  Discontinued        3.375 g 100 mL/hr over 30 Minutes Intravenous Every 6 hours 02/12/24 1634 02/13/24 0830   02/12/24 1730  vancomycin  (VANCOREADY) IVPB 2000 mg/400 mL        2,000 mg 200 mL/hr over 120 Minutes Intravenous  Once 02/12/24 1634 02/12/24 2056   02/11/24 2130  vancomycin  (VANCOCIN ) capsule 125 mg  Status:  Discontinued        125 mg Oral 4 times daily 02/11/24 2115 02/12/24 1544        I have personally reviewed the following labs and images: CBC: Recent Labs  Lab 02/16/24 0429 02/17/24 0636 02/18/24 0802 02/19/24 1111 02/20/24 0633  WBC 11.3* 7.8 8.0 7.3 6.9  HGB 8.7* 8.9* 9.0* 9.3* 8.8*  HCT 27.4* 28.3* 29.0* 29.1* 28.7*  MCV 93.2 94.0 95.4 94.2 95.3  PLT 183 145* 140* 120* 121*   BMP &GFR Recent Labs  Lab 02/14/24 0440 02/14/24 1536 02/15/24 0330 02/15/24 1609 02/16/24 0429 02/17/24 0636 02/18/24 0802 02/19/24 1111 02/20/24 0633  NA 136   < > 135 135 136 135 136 134* 134*  K 4.3   < > 3.9 3.9 3.9 3.8 4.2 4.0 4.4  CL 98   < > 102 102 103 104 104 99 99  CO2 24   < > 24 25 24 22 22 25 26   GLUCOSE 181*   < > 242* 245* 191* 143* 115* 178* 109*  BUN 34*   < > 28* 26* 22 29* 40* 25* 33*  CREATININE 3.53*   < > 2.60* 2.24* 1.91* 2.81* 4.23*  3.14* 4.24*  CALCIUM  8.6*   < > 8.9 8.7* 8.5* 7.8* 8.0* 7.7* 7.6*  MG 2.1  --  2.0  --  2.2  --  2.3  --   --   PHOS 4.2   < >  2.5 1.9* 2.3*  --  3.5 2.9 4.0   < > = values in this interval not displayed.   Estimated Creatinine Clearance: 22.2 mL/min (A) (by C-G formula based on SCr of 4.24 mg/dL (H)). Liver & Pancreas: Recent Labs  Lab 02/15/24 1609 02/16/24 0429 02/18/24 0802 02/19/24 1111 02/20/24 6295  ALBUMIN  2.6* 2.7* 2.4* 2.3* 2.3*   No results for input(s): LIPASE, AMYLASE in the last 168 hours. No results for input(s): AMMONIA in the last 168 hours. Diabetic: No results for input(s): HGBA1C in the last 72 hours. Recent Labs  Lab 02/19/24 1602 02/19/24 1929 02/19/24 2357 02/20/24 0407 02/20/24 0930  GLUCAP 169* 190* 136* 121* 155*   Cardiac Enzymes: No results for input(s): CKTOTAL, CKMB, CKMBINDEX, TROPONINI in the last 168 hours. No results for input(s): PROBNP in the last 8760 hours. Coagulation Profile: No results for input(s): INR, PROTIME in the last 168 hours. Thyroid  Function Tests: No results for input(s): TSH, T4TOTAL, FREET4, T3FREE, THYROIDAB in the last 72 hours. Lipid Profile: No results for input(s): CHOL, HDL, LDLCALC, TRIG, CHOLHDL, LDLDIRECT in the last 72 hours. Anemia Panel: No results for input(s): VITAMINB12, FOLATE, FERRITIN, TIBC, IRON, RETICCTPCT in the last 72 hours. Urine analysis:    Component Value Date/Time   COLORURINE YELLOW 09/22/2023 1510   APPEARANCEUR CLEAR 09/22/2023 1510   LABSPEC 1.013 09/22/2023 1510   PHURINE 7.0 09/22/2023 1510   GLUCOSEU 150 (A) 09/22/2023 1510   HGBUR NEGATIVE 09/22/2023 1510   BILIRUBINUR NEGATIVE 09/22/2023 1510   KETONESUR NEGATIVE 09/22/2023 1510   PROTEINUR >=300 (A) 09/22/2023 1510   UROBILINOGEN 0.2 01/14/2015 0346   NITRITE NEGATIVE 09/22/2023 1510   LEUKOCYTESUR SMALL (A) 09/22/2023 1510   Sepsis Labs: Invalid input(s):  PROCALCITONIN, LACTICIDVEN  Microbiology: Recent Results (from the past 240 hours)  Culture, blood (Routine x 2)     Status: None   Collection Time: 02/11/24  2:46 PM   Specimen: BLOOD RIGHT ARM  Result Value Ref Range Status   Specimen Description BLOOD RIGHT ARM  Final   Special Requests   Final    BOTTLES DRAWN AEROBIC AND ANAEROBIC Blood Culture adequate volume   Culture   Final    NO GROWTH 5 DAYS Performed at [facility], [address], [place], [place] [address]    Report Status 02/16/2024 FINAL  Final  Culture, blood (Routine x 2)     Status: None   Collection Time: 02/11/24  3:19 PM   Specimen: BLOOD  Result Value Ref Range Status   Specimen Description BLOOD RIGHT ANTECUBITAL  Final   Special Requests   Final    BOTTLES DRAWN AEROBIC AND ANAEROBIC Blood Culture adequate volume   Culture   Final    NO GROWTH 5 DAYS Performed at [facility], [address] [place], [place] [address]    Report Status 02/16/2024 FINAL  Final  Culture, group A strep     Status: None   Collection Time: 02/12/24  9:10 AM   Specimen: Throat  Result Value Ref Range Status   Specimen Description THROAT  Final   Special Requests NONE  Final   Culture   Final    NO GROUP A STREP (S.PYOGENES) ISOLATED Performed at [facility], [address] [place], [place] [address]    Report Status 02/15/2024 FINAL  Final  Body fluid culture w Gram Stain     Status: None   Collection Time: 02/13/24  9:31 AM  Specimen: PATH Cytology Misc. fluid; Body Fluid  Result Value Ref Range Status   Specimen Description PERITONEAL  Final   Special Requests NONE  Final   Gram Stain   Final    FEW WBC PRESENT, PREDOMINANTLY PMN NO ORGANISMS SEEN    Culture   Final    NO GROWTH 3 DAYS Performed at [facility], [address], [place], [place] [address]    Report Status 02/16/2024 FINAL  Final  MRSA Next Gen by PCR, Nasal     Status: Abnormal   Collection Time: 02/13/24 12:16  PM   Specimen: Nasal Mucosa; Nasal Swab  Result Value Ref Range Status   MRSA by PCR Next Gen DETECTED (A) NOT DETECTED Final    Comment: RESULT CALLED TO, READ BACK BY AND VERIFIED WITH: RN SANTEZ ALVAREZ ON 02/13/24 @ 1544 BY DRT (NOTE) The GeneXpert MRSA Assay (FDA approved for NASAL specimens only), is one component of a comprehensive MRSA colonization surveillance program. It is not intended to diagnose MRSA infection nor to guide or monitor treatment for MRSA infections. Test performance is not FDA approved in patients less than 16 years old. Performed at [facility], [address], [place], [place] [address]     Radiology Studies: No results found.    Lenzie Montesano T. Ameriah Lint [facility]  If 7PM-7AM, please contact night-coverage [email or web address] 02/20/2024, 3:13 PM

## 2024-02-20 NOTE — Progress Notes (Addendum)
 [facility] Progress Note   Subjective:Seen on HD. He has no complaints. UF as tolerated   Objective Vitals:   02/20/24 0756 02/20/24 0819 02/20/24 0955 02/20/24 1011  BP:  129/61 120/64 (!) 108/58  Pulse: 64 63 62   Resp: 16 18 (!) 24   Temp:  97.9 F (36.6 C) 98 F (36.7 C)   TempSrc:      SpO2:  100%    Weight:   110.6 kg   Height:       Physical Exam General: Chronically ill appearing male in NAD Heart: S1,S2 RRR No M/R/G Aflutter on monitor variable block Lungs: CTAB A/P Abdomen: NABS, NT Extremities: Bilateral BKA no stump edema Dialysis Access: L AVF cannulated    Additional Objective Labs: Basic Metabolic Panel: Recent Labs  Lab 02/18/24 0802 02/19/24 1111 02/20/24 0633  NA 136 134* 134*  K 4.2 4.0 4.4  CL 104 99 99  CO2 22 25 26   GLUCOSE 115* 178* 109*  BUN 40* 25* 33*  CREATININE 4.23* 3.14* 4.24*  CALCIUM  8.0* 7.7* 7.6*  PHOS 3.5 2.9 4.0   Liver Function Tests: Recent Labs  Lab 02/18/24 0802 02/19/24 1111 02/20/24 0633  ALBUMIN  2.4* 2.3* 2.3*   No results for input(s): LIPASE, AMYLASE in the last 168 hours. CBC: Recent Labs  Lab 02/16/24 0429 02/17/24 0636 02/18/24 0802 02/19/24 1111 02/20/24 0633  WBC 11.3* 7.8 8.0 7.3 6.9  HGB 8.7* 8.9* 9.0* 9.3* 8.8*  HCT 27.4* 28.3* 29.0* 29.1* 28.7*  MCV 93.2 94.0 95.4 94.2 95.3  PLT 183 145* 140* 120* 121*   Blood Culture    Component Value Date/Time   SDES PERITONEAL 02/13/2024 0931   SPECREQUEST NONE 02/13/2024 0931   CULT  02/13/2024 0931    NO GROWTH 3 DAYS Performed at [facility], [address], [place], [place] [address]    REPTSTATUS 02/16/2024 FINAL 02/13/2024 0931    Cardiac Enzymes: No results for input(s): CKTOTAL, CKMB, CKMBINDEX, TROPONINI in the last 168 hours. CBG: Recent Labs  Lab 02/19/24 1602 02/19/24 1929 02/19/24 2357 02/20/24 0407 02/20/24 0930  GLUCAP 169* 190* 136* 121* 155*   Iron Studies: No results for  input(s): IRON, TIBC, TRANSFERRIN, FERRITIN in the last 72 hours. @lablastinr3 @ Studies/Results: No results found. Medications:  anticoagulant sodium citrate      heparin  1,100 Units/hr (02/20/24 0746)    arformoterol   15 mcg Nebulization BID   atorvastatin   80 mg Oral QHS   Chlorhexidine  Gluconate Cloth  6 each Topical Q0600   clopidogrel   75 mg Oral Q breakfast   colchicine  0.3 mg Oral Daily   feeding supplement  237 mL Oral BID BM   gabapentin   100 mg Oral QHS   insulin  aspart  0-6 Units Subcutaneous TID WC   insulin  aspart  2 Units Subcutaneous TID WC   insulin  glargine-yfgn  7 Units Subcutaneous BID   midodrine   10 mg Oral Q8H   multivitamin  1 tablet Oral QHS   revefenacin   175 mcg Nebulization Daily   rOPINIRole   2 mg Oral QHS   sevelamer  carbonate  1,600 mg Oral TID WC   thiamine   100 mg Oral Daily   vancomycin   125 mg Oral Daily     [facility] 4[facility]  B450   107kg   AVF  Heparin  none Last OP HD 5/31, post wt 112.5kg Partial HD 6/03, post wt 116.5kg (up 9kg), 1.5hr session, sent to ed Hectorol  2 mcg iv  three times per week Mircera 150 mcg IV q 2 weeks (last dose Mircera 225 mcg  01/11/2024)  Assessment plan:    # Pericardial effusion/ tamponade: s/p pericardiocentesis 6/05 per cardiology.   # Shock: off levo and vaso.  On midodrine  now, monitor BP.   # AF with tachy-brady syndrome-AF in monitor today. BP Stable. Per cards.    # ESRD: usual HD TTS. CRRT 6/5-6/8.  Tolerated dialysis well yesterday with almost 2 L UF. Now out of ICU, tolerating IHD well. Next HD 02/22/2024   # Anemia of esrd: Hb 8.8  here. Resume ESA today.  # Secondary hyperparathyroidism: CCa in range, phos low d/t CRRT.  Currently on sevelamer  as he started eating. PO4 at goal. Corrected Ca at goal    #PAF: on eliquis   #CAD: sp PCI w/ DES in march 2025  # Nutrition: Very low albumin . Add protein supplements, fluid restrictions with meals      Sidney Silberman H. Zanaiya Calabria  NP-C 02/20/2024, 10:22 AM  [employer] [phone]

## 2024-02-20 NOTE — Progress Notes (Signed)
 PHARMACY - ANTICOAGULATION CONSULT NOTE  Pharmacy Consult for heparin  Indication: atrial fibrillation  Allergies  Allergen Reactions   Contrast Media [Iodinated Contrast Media] Shortness Of Breath and Other (See Comments)    Difficulty breathing and altered mental status     Ivp Dye [Iodinated Contrast Media] Anaphylaxis, Shortness Of Breath and Other (See Comments)    Breathing problems, altered mental state    Latex Rash and Other (See Comments)    A severe rash appears after the first 24 hours of being placed   Tape Other (See Comments) and Rash    Rash after 1 day of use  Do not leave on longer then 3 days with out be changed    Patient Measurements: Height: 6' 4 (193 cm) Weight: 108.6 kg (239 lb 6.7 oz) IBW/kg (Calculated) : 86.8 HEPARIN  DW (KG): 110  Vital Signs: Temp: 98.4 F (36.9 C) (06/12 1515) Temp Source: Oral (06/12 1400) BP: 139/65 (06/12 1515) Pulse Rate: 67 (06/12 1515)  Labs: Recent Labs    02/18/24 0802 02/19/24 1111 02/19/24 1822 02/20/24 0633 02/20/24 1226 02/20/24 1758  HGB 9.0* 9.3*  --  8.8*  --   --   HCT 29.0* 29.1*  --  28.7*  --   --   PLT 140* 120*  --  121*  --   --   APTT  --   --   --   --  51* 37*  HEPARINUNFRC  --   --  >1.10* 1.02*  --  0.89*  CREATININE 4.23* 3.14*  --  4.24*  --   --     Estimated Creatinine Clearance: 22.2 mL/min (A) (by C-G formula based on SCr of 4.24 mg/dL (H)).   Medical History: Past Medical History:  Diagnosis Date   Carotid artery occlusion 11/10/2010   LEFT CAROTID ENDARTERECTOMY   Complication of anesthesia    BP WENT UP AT [facility]    COPD (chronic obstructive pulmonary disease) (HCC)    pt denies this dx as of 06/01/20 - no inhaler    Diabetes mellitus without complication (HCC)    Diverticulitis    Diverticulosis of colon (without mention of hemorrhage)    DJD (degenerative joint disease)    knees/hands/feet/back/neck   ESRD (end stage renal disease) on dialysis (HCC)    Fatty liver     Full dentures    GERD (gastroesophageal reflux disease)    H/O hiatal hernia    History of blood transfusion    with a past surical procedure per patient 06/01/20   Hyperlipidemia    Hypertension    Neuromuscular disorder (HCC)    peripheral neuropathy   Non-pressure chronic ulcer of other part of left foot limited to breakdown of skin (HCC) 11/12/2016   Osteomyelitis (HCC)    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 Atherectomy left popliteal artery July 2012.    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 Previously transitioned from Eliquis  to IV heparin  this admission but was discontinued s/p emergent pericardiocentesis 6/5 due to  large amount of  hemorrhagic fluid amd placement of pericardial drain. Patient now stable, pericardial drain out 6/8 and since no signs/symptoms of bleeding. Hgb 9.3, Plt 120, low stable. Pharmacy consulted to restart IV heparin .  Due to recent pericardial bleeding will start heparin  infusion without bolus, lower initial therapeutic goal and titrate conservatively to ensure he tolerates.    6/12:   Heparin  level = 1.02, above goal 0.3-0.5, on heparin  1100 units/hr.  Restricted L arm access.  No bleeding reported.  Hgb 8.8, low stable in 9s-8s and pltc 121 low/stable .   Patient in hemodialysis.   Contacted RN in Hemodialysis to hold heparin  drip x 1hour as indicated below in plan.   PM: HL 0.89 on heparin  850 units/hr. No issues with the infusion or bleeding reported per RN.   Goal of Therapy:  Heparin  level 0.3-0.5 units/ml Monitor platelets by anticoagulation  protocol: Yes   Plan:  Decrease heparin  to 700 units/hr Next heparin  level with AM labs Daily HL, aPTT, and CBC Continue to monitor CBC and for signs/symptoms of bleeding  Armanda Bern, PharmD, BCPS Clinical Pharmacist 02/20/2024  6:58 PM  Please check AMION for all [facility] phone numbers After 10:00 PM, call Main Pharmacy [phone]

## 2024-02-20 NOTE — Progress Notes (Addendum)
  Progress Note  Patient Name: [name] Date of Encounter: 02/20/2024 [facility] Cardiologist: Knox Perl, MD    Interval Summary     68 yo with hx of atrial fib, ESRD,  CAD  Admitted with hypotension during dialysis Was found to have pericardial tamponade  Is s/p pericaridiocentesis with 1280 cc of bloody fluid removed.  Cytology was negative   He was in the CCU until today  The plan is to repeat echo in 4 weeks     Vital Signs Vitals:   02/20/24 1030 02/20/24 1100 02/20/24 1130 02/20/24 1200  BP: 126/67 124/66 124/67 137/62  Pulse: 65 68 69   Resp: (!) 21 (!) 23 19   Temp:      TempSrc:      SpO2: 95% 98% 100%   Weight:      Height:        Intake/Output Summary (Last 24 hours) at 02/20/2024 1227 Last data filed at 02/20/2024 0746 Gross per 24 hour  Intake 122.1 ml  Output --  Net 122.1 ml      02/20/2024    9:55 AM 02/19/2024   10:02 AM 02/19/2024    1:00 AM  Last 3 Weights  Weight (lbs) 243 lb 13.3 oz 243 lb 9.7 oz 243 lb 9.7 oz  Weight (kg) 110.6 kg 110.5 kg 110.5 kg      Telemetry/ECG  Atrial flutter with regular ventricular response    - Personally Reviewed  Physical Exam  GEN: No acute distress.   Neck: No JVD Cardiac: RRR, no murmurs, rubs, or gallops.  Respiratory: Clear to auscultation bilaterally. GI: Soft, nontender, non-distended  MS:  s/p bilat AKA  Assessment & Plan    Pericardial tamponade :   s/p drainage.  Unclear etiology.  Cytology was negative .  Continue current plans , current meds   Will need a follow up echo in 4 weeks .   2.  CAD :   s/p PCI in March 2025.  Cont plavix  and eliquis  . no angina   3.  PAF / Atrial flutter  :  was on eliquis  on admission , is currently on heparin          For questions or updates, please contact [facility] Please consult [email or web address] for contact info under       Signed, Ahmad Alert, MD

## 2024-02-20 NOTE — Progress Notes (Signed)
 Pt completed HD treatment without issue.  02/20/24 1400  Vitals  Temp 98.1 F (36.7 C)  Temp Source Oral  BP Location Right Arm  BP Method Automatic  Patient Position (if appropriate) Lying  During Treatment Monitoring  HD Safety Checks Performed Yes  Intra-Hemodialysis Comments Tx completed  Post Treatment  Dialyzer Clearance Lightly streaked  Hemodialysis Intake (mL) 0 mL  Liters Processed 84  Fluid Removed (mL) 2000 mL  Tolerated HD Treatment Yes  Post-Hemodialysis Comments goal met.  AVG/AVF Arterial Site Held (minutes) 10 minutes  AVG/AVF Venous Site Held (minutes) 10 minutes  Fistula / Graft Left Upper arm Arteriovenous fistula  Placement Date/Time: 07/21/20 0825   Placed prior to admission: No  Orientation: Left  Access Location: Upper arm  Access Type: Arteriovenous fistula  Site Condition No complications  Fistula / Graft Assessment Present;Thrill;Bruit  Status Deaccessed  Needle Size 15  Drainage Description None

## 2024-02-20 NOTE — Progress Notes (Signed)
 PHARMACY - ANTICOAGULATION CONSULT NOTE  Pharmacy Consult for heparin  Indication: atrial fibrillation  Allergies  Allergen Reactions   Contrast Media [Iodinated Contrast Media] Shortness Of Breath and Other (See Comments)    Difficulty breathing and altered mental status     Ivp Dye [Iodinated Contrast Media] Anaphylaxis, Shortness Of Breath and Other (See Comments)    Breathing problems, altered mental state    Latex Rash and Other (See Comments)    A severe rash appears after the first 24 hours of being placed   Tape Other (See Comments) and Rash    Rash after 1 day of use  Do not leave on longer then 3 days with out be changed    Patient Measurements: Height: 6' 4 (193 cm) Weight: 110.6 kg (243 lb 13.3 oz) IBW/kg (Calculated) : 86.8 HEPARIN  DW (KG): 110  Vital Signs: Temp: 98 F (36.7 C) (06/12 0955) Temp Source: Oral (06/12 0411) BP: 126/67 (06/12 1030) Pulse Rate: 62 (06/12 1002)  Labs: Recent Labs    02/18/24 0802 02/19/24 1111 02/19/24 1822 02/20/24 0633  HGB 9.0* 9.3*  --  8.8*  HCT 29.0* 29.1*  --  28.7*  PLT 140* 120*  --  121*  HEPARINUNFRC  --   --  >1.10* 1.02*  CREATININE 4.23* 3.14*  --  4.24*    Estimated Creatinine Clearance: 22.4 mL/min (A) (by C-G formula based on SCr of 4.24 mg/dL (H)).   Medical History: Past Medical History:  Diagnosis Date   Carotid artery occlusion 11/10/2010   LEFT CAROTID ENDARTERECTOMY   Complication of anesthesia    BP WENT UP AT [facility]    COPD (chronic obstructive pulmonary disease) (HCC)    pt denies this dx as of 06/01/20 - no inhaler    Diabetes mellitus without complication (HCC)    Diverticulitis    Diverticulosis of colon (without mention of hemorrhage)    DJD (degenerative joint disease)    knees/hands/feet/back/neck   ESRD (end stage renal disease) on dialysis (HCC)    Fatty liver    Full dentures    GERD (gastroesophageal reflux disease)    H/O hiatal hernia    History of blood transfusion     with a past surical procedure per patient 06/01/20   Hyperlipidemia    Hypertension    Neuromuscular disorder (HCC)    peripheral neuropathy   Non-pressure chronic ulcer of other part of left foot limited to breakdown of skin (HCC) 11/12/2016   Osteomyelitis (HCC)    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 Atherectomy left popliteal artery July 2012.    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 Previously transitioned from Eliquis  to IV heparin  this admission but was discontinued s/p emergent pericardiocentesis 6/5 due to large amount of  hemorrhagic fluid amd placement of pericardial drain. Patient now stable, pericardial drain out 6/8 and since no signs/symptoms of bleeding. Hgb 9.3, Plt 120, low stable. Pharmacy consulted to restart IV heparin .  Due to recent pericardial bleeding will start heparin  infusion without bolus,  lower initial therapeutic goal and titrate conservatively to ensure he tolerates.    6/12:   Heparin  level = 1.02, above goal 0.3-0.5, on heparin  1100 units/hr.  Restricted L arm access.  No bleeding reported.  Hgb 8.8, low stable in 9s-8s and pltc 121 low/stable .   Patient in hemodialysis.   Contacted RN in Hemodialysis to hold heparin  drip x 1hour as indicated below in plan.   Goal of Therapy:  Heparin  level 0.3-0.5 units/ml Monitor platelets by anticoagulation protocol: Yes   Plan:  Hold heparin  x 1 hour (held at 10:26 in HD) Then resume heparin  at 850 units/hr Will add on aPTT to the prior AM heparin  level to see if apixaban  still effecting heparin  level.  Check 6 hour  HL, aPTT after rate decreased.  Daily HL, aPTT, and CBC Continue to monitor CBC and for signs/symptoms of bleeding  Alisa Irish, RPh Clinical Pharmacist 02/20/2024  11:37 AM

## 2024-02-20 NOTE — TOC Progression Note (Signed)
 Transition of Care [facility]) - Progression Note    Patient Details  Name: [name] MRN: [id number] Date of Birth: 06-Jul-1956  Transition of Care [facility]) CM/SW Contact  Elspeth Hals, LCSW Phone Number: 02/20/2024, 3:42 PM  Clinical Narrative:   CSW message with Hillis Lu, who confirmed pt is LTC resident and can return at DC.  Per Jenette Mitchell, no auth, no FL2 needed, just DC summary.  CSW spoke with pt and wife, who also confirm this plan.  Wife [name] said she was contacted about a meeting on Friday, will check with MD.     Expected Discharge Plan: Skilled Nursing Facility Barriers to Discharge: Continued Medical Work up  Expected Discharge Plan and Services   Discharge Planning Services: CM Consult Post Acute Care Choice: Skilled Nursing Facility Living arrangements for the past 2 months: Skilled Nursing Facility                                       Social Determinants of Health (SDOH) Interventions SDOH Screenings   Food Insecurity: No Food Insecurity (02/14/2024)  Housing: Low Risk  (02/14/2024)  Transportation Needs: No Transportation Needs (02/14/2024)  Utilities: Not At Risk (02/14/2024)  Social Connections: Moderately Isolated (02/14/2024)  Tobacco Use: High Risk (02/11/2024)    Readmission Risk Interventions    05/29/2023    4:41 PM 06/30/2021   11:53 AM  Readmission Risk Prevention Plan  Transportation Screening Complete Complete  Medication Review Oceanographer) Complete Complete  PCP or Specialist appointment within 3-5 days of discharge Complete   HRI or Home Care Consult Complete Complete  SW Recovery Care/Counseling Consult Complete Complete  Palliative Care Screening Not Applicable Not Applicable  Skilled Nursing Facility Complete Not Applicable

## 2024-02-21 LAB — GLUCOSE, CAPILLARY
Glucose-Capillary: 127 mg/dL — ABNORMAL HIGH (ref 70–99)
Glucose-Capillary: 181 mg/dL — ABNORMAL HIGH (ref 70–99)

## 2024-02-21 LAB — MAGNESIUM: Magnesium: 1.8 mg/dL (ref 1.7–2.4)

## 2024-02-21 LAB — RENAL FUNCTION PANEL
Albumin: 2.4 g/dL — ABNORMAL LOW (ref 3.5–5.0)
Anion gap: 11 (ref 5–15)
BUN: 22 mg/dL (ref 8–23)
CO2: 27 mmol/L (ref 22–32)
Calcium: 7.7 mg/dL — ABNORMAL LOW (ref 8.9–10.3)
Chloride: 98 mmol/L (ref 98–111)
Creatinine, Ser: 3.55 mg/dL — ABNORMAL HIGH (ref 0.61–1.24)
GFR, Estimated: 18 mL/min — ABNORMAL LOW (ref >60)
Glucose, Bld: 136 mg/dL — ABNORMAL HIGH (ref 70–99)
Phosphorus: 3.3 mg/dL (ref 2.5–4.6)
Potassium: 3.9 mmol/L (ref 3.5–5.1)
Sodium: 136 mmol/L (ref 135–145)

## 2024-02-21 LAB — CBC
HCT: 29.4 % — ABNORMAL LOW (ref 39.0–52.0)
Hemoglobin: 9 g/dL — ABNORMAL LOW (ref 13.0–17.0)
MCH: 29.4 pg (ref 26.0–34.0)
MCHC: 30.6 g/dL (ref 30.0–36.0)
MCV: 96.1 fL (ref 80.0–100.0)
Platelets: 136 10*3/uL — ABNORMAL LOW (ref 150–400)
RBC: 3.06 MIL/uL — ABNORMAL LOW (ref 4.22–5.81)
RDW: 18.3 % — ABNORMAL HIGH (ref 11.5–15.5)
WBC: 6 10*3/uL (ref 4.0–10.5)
nRBC: 0 % (ref 0.0–0.2)

## 2024-02-21 LAB — APTT: aPTT: 34 s (ref 24–36)

## 2024-02-21 LAB — HEPARIN LEVEL (UNFRACTIONATED): Heparin Unfractionated: 0.72 [IU]/mL — ABNORMAL HIGH (ref 0.30–0.70)

## 2024-02-21 MED ORDER — COLCHICINE 0.6 MG PO TABS: 0.3000 mg | ORAL_TABLET | Freq: Every day | ORAL | Status: AC

## 2024-02-21 MED ORDER — VANCOMYCIN HCL 125 MG PO CAPS: 125.0000 mg | ORAL_CAPSULE | Freq: Every day | ORAL | Status: AC

## 2024-02-21 MED ORDER — APIXABAN 5 MG PO TABS
5.0000 mg | ORAL_TABLET | Freq: Two times a day (BID) | ORAL | Status: DC
  Administered 2024-02-21: 5 mg via ORAL
  Filled 2024-02-21: qty 1

## 2024-02-21 MED ORDER — DARBEPOETIN ALFA 100 MCG/0.5ML IJ SOSY
100.0000 ug | PREFILLED_SYRINGE | INTRAMUSCULAR | Status: DC
  Filled 2024-02-21: qty 0.5

## 2024-02-21 MED ORDER — ISOSORBIDE MONONITRATE ER 30 MG PO TB24: 30.0000 mg | ORAL_TABLET | Freq: Every day | ORAL | Status: DC

## 2024-02-21 NOTE — Progress Notes (Signed)
 [facility] Progress Note   Subjective: No new complaints. DC orders noted. HD tomorrow at OP center on schedule    Objective Vitals:   02/20/24 1946 02/20/24 2007 02/21/24 0558 02/21/24 0825  BP:  (!) 115/54 (!) 153/71   Pulse:  64 64 66  Resp:  19 19 16   Temp:  98.4 F (36.9 C) 98 F (36.7 C)   TempSrc:      SpO2: 97% 99% 99%   Weight:      Height:       Physical Exam General: Chronically ill appearing male in NAD Heart: S1,S2 RRR No M/R/G  Lungs: CTAB A/P Abdomen: NABS, NT Extremities: Bilateral BKA no stump edema Dialysis Access: L AVF cannulated Additional Objective Labs: Basic Metabolic Panel: Recent Labs  Lab 02/19/24 1111 02/20/24 0633 02/21/24 0604  NA 134* 134* 136  K 4.0 4.4 3.9  CL 99 99 98  CO2 25 26 27   GLUCOSE 178* 109* 136*  BUN 25* 33* 22  CREATININE 3.14* 4.24* 3.55*  CALCIUM  7.7* 7.6* 7.7*  PHOS 2.9 4.0 3.3   Liver Function Tests: Recent Labs  Lab 02/19/24 1111 02/20/24 0633 02/21/24 0604  ALBUMIN  2.3* 2.3* 2.4*   No results for input(s): LIPASE, AMYLASE in the last 168 hours. CBC: Recent Labs  Lab 02/17/24 0636 02/18/24 0802 02/19/24 1111 02/20/24 0633 02/21/24 0604  WBC 7.8 8.0 7.3 6.9 6.0  HGB 8.9* 9.0* 9.3* 8.8* 9.0*  HCT 28.3* 29.0* 29.1* 28.7* 29.4*  MCV 94.0 95.4 94.2 95.3 96.1  PLT 145* 140* 120* 121* 136*   Blood Culture    Component Value Date/Time   SDES PERITONEAL 02/13/2024 0931   SPECREQUEST NONE 02/13/2024 0931   CULT  02/13/2024 0931    NO GROWTH 3 DAYS Performed at [facility], [address], [place], [place] [address]    REPTSTATUS 02/16/2024 FINAL 02/13/2024 0931    Cardiac Enzymes: No results for input(s): CKTOTAL, CKMB, CKMBINDEX, TROPONINI in the last 168 hours. CBG: Recent Labs  Lab 02/20/24 0930 02/20/24 1556 02/20/24 2016 02/20/24 2105 02/21/24 0613  GLUCAP 155* 130* 176* 181* 127*   Iron Studies: No results for input(s): IRON, TIBC,  TRANSFERRIN, FERRITIN in the last 72 hours. @lablastinr3 @ Studies/Results: No results found. Medications:   (feeding supplement) PROSource Plus  30 mL Oral BID BM   apixaban   5 mg Oral BID   arformoterol   15 mcg Nebulization BID   atorvastatin   80 mg Oral QHS   Chlorhexidine  Gluconate Cloth  6 each Topical Q0600   clopidogrel   75 mg Oral Q breakfast   colchicine   0.3 mg Oral Daily   darbepoetin (ARANESP ) injection - DIALYSIS  100 mcg Subcutaneous Q Fri-1800   feeding supplement  237 mL Oral BID BM   feeding supplement (NEPRO CARB STEADY)  237 mL Oral BID BM   gabapentin   100 mg Oral QHS   insulin  aspart  0-6 Units Subcutaneous TID WC   insulin  aspart  2 Units Subcutaneous TID WC   insulin  glargine-yfgn  7 Units Subcutaneous BID   multivitamin  1 tablet Oral QHS   revefenacin   175 mcg Nebulization Daily   rOPINIRole   2 mg Oral QHS   sevelamer  carbonate  1,600 mg Oral TID WC   thiamine   100 mg Oral Daily   vancomycin   125 mg Oral Daily     OP HD: [place] T[place] 4h  B450   107kg   AVF  Heparin  none Last OP HD  5/31, post wt 112.5kg Partial HD 6/03, post wt 116.5kg (up 9kg), 1.5hr session, sent to ed Hectorol  2 mcg iv three times per week Mircera 150 mcg IV q 2 weeks (last dose Mircera 225 mcg  01/11/2024)   Assessment plan:    # Pericardial effusion/ tamponade: s/p pericardiocentesis 6/05 per cardiology.   # Shock: off levo and vaso.  On midodrine  now, monitor BP.    # AF with tachy-brady syndrome-AF in monitor today. BP Stable. Per cards.    # ESRD: usual HD TTS. CRRT 6/5-6/8.  Tolerated dialysis well yesterday with almost 2 L UF. Now out of ICU, tolerating IHD well. Next HD 02/22/2024 at OP Center   # Anemia of esrd: Hb 8.8  here. Resume ESA today.   # Secondary hyperparathyroidism: CCa in range, phos low d/t CRRT.  Currently on sevelamer  as he started eating. PO4 at goal. Corrected Ca at goal    #PAF: on eliquis    #CAD: sp PCI w/ DES in march 2025   #  Nutrition: Very low albumin . Add protein supplements, fluid restrictions with meals    Erandi Lemma H. Jaymen Fetch NP-C 02/21/2024, 11:18 AM  [employer] [phone]

## 2024-02-21 NOTE — Progress Notes (Signed)
 OT Cancellation Note - OT Screen   Patient Details Name: [name] MRN: [id number] DOB: Feb 07, 1956   Cancelled Treatment:    Reason Eval/Treat Not Completed: OT screened, no needs identified, will sign off (Pt is a resident of [facility]. At baseline, he requires up to Max assist with ADLs, a hoyer-style lift for transfers, and performs functional mobility with a w/c. Pt is currently at baseline with no skilled OT needs identified. OT signing off.)  Ronnell Coins., OTR/L, MA Acute Rehab [phone]   Walt Gunner 02/21/2024, 12:11 PM

## 2024-02-21 NOTE — Discharge Planning (Signed)
 [facility] Patient Discharge Orders- [facility] CLINIC: [facility]  Patient's name: [name] Admit/DC Dates: 02/11/2024 - 02/21/2024  Discharge Diagnoses: Combined shock-septic due to RLL pneumonia, obstructive from large pericardial effusion with cardiac tamponade   Severe pericardial effusion with tamponade  Recent C diff Colitis  Aranesp : Given: No   Date and amount of last dose: NA   PRBC's Given: No Date/# of units: NA ESA dose for discharge: mircera 100 mcg IV q 2 weeks  IV Iron dose at discharge: Per protocol continue weekly iron as ordered Last Hgb: 9.0  Heparin  change: No  EDW Change: Yes  New EDW: 108 kg  [place]ath Change: No  Access intervention/Change: No Details:  Hectorol /Calcitriol change: No  Discharge Labs: Calcium  7.7 Phosphorus 3.3 Albumin  2.4 K+ 3.9  IV Antibiotics: No  Details:  On Coumadin?: No. Plavix  and Apixaban  Last INR: Next INR: Managed By:   OTHER/APPTS/LAB ORDERS:  Update med list. No beta blockers or amiodarone .   D/C Meds to be reconciled by nurse after every discharge.  Completed By: Jacobo Masters [facility] [phone]    Reviewed by: MD:______ RN_______

## 2024-02-21 NOTE — Progress Notes (Signed)
 PHARMACY - ANTICOAGULATION CONSULT NOTE  Pharmacy Consult for heparin  Indication: atrial fibrillation  Allergies  Allergen Reactions   Contrast Media [Iodinated Contrast Media] Shortness Of Breath and Other (See Comments)    Difficulty breathing and altered mental status     Ivp Dye [Iodinated Contrast Media] Anaphylaxis, Shortness Of Breath and Other (See Comments)    Breathing problems, altered mental state    Latex Rash and Other (See Comments)    A severe rash appears after the first 24 hours of being placed   Tape Other (See Comments) and Rash    Rash after 1 day of use  Do not leave on longer then 3 days with out be changed    Patient Measurements: Height: 6' 4 (193 cm) Weight: 108.6 kg (239 lb 6.7 oz) IBW/kg (Calculated) : 86.8 HEPARIN  DW (KG): 110  Vital Signs: Temp: 98 F (36.7 C) (06/13 0558) BP: 153/71 (06/13 0558) Pulse Rate: 66 (06/13 0825)  Labs: Recent Labs    02/19/24 1111 02/19/24 1822 02/20/24 0633 02/20/24 1226 02/20/24 1758 02/21/24 0604  HGB 9.3*  --  8.8*  --   --  9.0*  HCT 29.1*  --  28.7*  --   --  29.4*  PLT 120*  --  121*  --   --  136*  APTT  --   --   --  51* 37* 34  HEPARINUNFRC  --    < > 1.02*  --  0.89* 0.72*  CREATININE 3.14*  --  4.24*  --   --  3.55*   < > = values in this interval not displayed.    Estimated Creatinine Clearance: 26.5 mL/min (A) (by C-G formula based on SCr of 3.55 mg/dL (H)).   Medical History: Past Medical History:  Diagnosis Date   Carotid artery occlusion 11/10/2010   LEFT CAROTID ENDARTERECTOMY   Complication of anesthesia    BP WENT UP AT [facility]    COPD (chronic obstructive pulmonary disease) (HCC)    pt denies this dx as of 06/01/20 - no inhaler    Diabetes mellitus without complication (HCC)    Diverticulitis    Diverticulosis of colon (without mention of hemorrhage)    DJD (degenerative joint disease)    knees/hands/feet/back/neck   ESRD (end stage renal disease) on dialysis (HCC)     Fatty liver    Full dentures    GERD (gastroesophageal reflux disease)    H/O hiatal hernia    History of blood transfusion    with a past surical procedure per patient 06/01/20   Hyperlipidemia    Hypertension    Neuromuscular disorder (HCC)    peripheral neuropathy   Non-pressure chronic ulcer of other part of left foot limited to breakdown of skin (HCC) 11/12/2016   Osteomyelitis (HCC)    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 Atherectomy left popliteal artery July 2012.    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 Previously transitioned from Eliquis  to IV heparin  this admission but was discontinued s/p  emergent pericardiocentesis 6/5 due to large amount of  hemorrhagic fluid amd placement of pericardial drain. Patient now stable, pericardial drain out 6/8 and since no signs/symptoms of bleeding. Hgb 9.3, Plt 120, low stable. Pharmacy consulted to restart IV heparin .  Heparin  level and aptt still not correlating 8 days after last dose. Discussed with MD, who is ok with switching back to apixaban .   Goal of Therapy:  Heparin  level 0.3-0.5 units/ml Monitor platelets by anticoagulation protocol: Yes   Plan:  Stop heparin  Start apixaban  5 mg BID  Monitor signs/symptoms of bleeding  Dorene Gang, PharmD, BCPS, BCCP Clinical Pharmacist  Please check AMION for all [facility] phone numbers After 10:00 PM, call Main Pharmacy [phone]

## 2024-02-21 NOTE — Care Management Important Message (Signed)
 Important Message  Patient Details  Name: [name] MRN: [id number] Date of Birth: 09-06-1956   Important Message Given:  Yes - Medicare IM     Felix Host 02/21/2024, 11:51 AM

## 2024-02-21 NOTE — Progress Notes (Signed)
 D/C order noted and advised by CSW yesterday of pt's likely d/c today. Contacted [facility] to be advised of pt's d/c today and that pt should resume care tomorrow.   Lauraine Polite Renal Navigator [phone]

## 2024-02-21 NOTE — Discharge Summary (Signed)
 Physician Discharge Summary  ART LEVAN ZOX:[id number] DOB: [birth year] DOA: 02/11/2024  PCP: Aloha Arnold, PA-C  Admit date: 02/11/2024 Discharge date: 02/21/24  Admitted From: LTC Disposition: LTAC Recommendations for Outpatient Follow-up:  Follow up with cardiology and nephrology in 1 to 2 weeks Check BP, fluid status, CMP and CBC in 1 week Please follow up on the following pending results: None   Discharge Condition: Stable CODE STATUS: Full code   Hospital course 68 year old male with PMH of COPD, insulin -dependent T2DM, GERD, CAD, PAD, bilateral BKA, HLD, HTN, OSA, ESRD on HD TTS, recent admission for Cdiff colitis who presented to the emergency department on 6/3 weakness and hypotension, decreased PO intake. He was reportedly at dialysis when he became hypotensive after 1L removed to 80s systolic. In ED, Na 133, Cl 93, BUN 41/sCr 6.26, AG 16, WBC 13.0, hgb 10.5, trop 44, lactic 2.4. In ED he received 1L IVF, midodrine  with improvement in his BP. He was admitted to hospitalist. Hospitalist gave albumin  25g x2.    Patient had bradycardic episode and hypotension on 6/4 and PCCM consulted.  TTE on 6/5 with LVEF of 60 to 65%, severe concentric LVH, indeterminate DD, right ventricular pressure and volume overload, and large pericardial effusion with tamponade physiology.  Cardiology and advanced heart failure team consulted.  He underwent pericardiocentesis with removal of 1280 cc right away, and 570 cc in less than 24 hours.  He had TVP placed for bradycardia.  Eventually, TVP and pericardial drains discontinued on 6/8, and he was transferred to [facility] hospitalist service on 6/12.   Patient came off midodrine .  Some of home antihypertensive meds resumed.   See individual problem list below for more.   Problems addressed during this hospitalization Combined shock-septic due to RLL pneumonia, obstructive from large pericardial effusion with cardiac tamponade Lactic acidosis due to  shock -Plavix  resumed on 6/10 -Heparin  resumed on 6/11 and he was transitioned to p.o. Eliquis  on discharge.  -Completed course of antibiotics -Midodrine  discontinued.   Severe pericardial effusion with tamponade: Unknown etiology of pericardial effusion. -Underwent pericardiocentesis with removal of 1280 cc right away, and 570 cc in less than 24 hours -Continue low-dose colchicine  -Repeat echo in 4 to 6 weeks   Symptomatic bradycardia in the setting of beta-blocker and amiodarone ; s/p TVP Paroxysmal atrial fibrillation: Rate controlled. -Coreg  and amiodarone  discontinued due to symptomatic bradycardia. -Anticoagulation as above -Optimize electrolytes   Coronary Artery Disease s/p stent to ostial/proximal RCA and mid RCA, PCI proximal RCA 11/2023 -Con't plavix  and statin, holding aspirin  to ensure no recurrent bleeding  -Avoid nodal blocking agents -Resumed Imdur  at reduced dose   Recent C. Diff Colitis- during last admission, he was on vancomycin  which was supposed to be completed on 6/4. -Complete 5 days of oral vanc after stopping systemic antibiotics   Acute respiratory failure with hypoxia: Resolved. Right lower lobe pneumonia: Resolved. Bilateral pleural effusions> likely 2/2 ESRD. L>R, both dependent. -Pulmonary hygiene -Declined thora on the left while in ICU; con't using iHD to pull fluid as tolerated   COPD, not acutely exacerbated - Continue home meds   ESRD/hyperkalemia -Hemodialysis per nephrology   DMII with uncontrolled hyperglycemia - Continue home insulin    Concern for dysphagia: Resolved. Moderate protein energy malnutrition; self discontinued his cortrak today   PAD/bilateral BKA -plavix , statin   Anemia due to ESRD; 3g drop in Hb in the past few days likely due to hemorrhagic pericarditis -transfuse for Hb <7 or hemodynamically significant bleeding   Prolonged Qtc (  even corrected for BBB) - Avoid QT prolonging drugs   GG pulmonary nodule RUL;  incidental finding  -needs OP follow up CT in 6-12 months  Chronic pain -Continue home fentanyl  and oxycodone    Moderate malnutrition Nutrition Problem: Moderate Malnutrition Etiology: chronic illness Signs/Symptoms: moderate fat depletion, moderate muscle depletion, edema Interventions: Refer to RD note for recommendations   Time spent 35 minutes  Vital signs Vitals:   02/20/24 1515 02/20/24 1946 02/20/24 2007 02/21/24 0558  BP: 139/65  (!) 115/54 (!) 153/71  Pulse: 67  64 64  Temp: 98.4 F (36.9 C)  98.4 F (36.9 C) 98 F (36.7 C)  Resp: 18  19 19   Height:      Weight:      SpO2: 100% 97% 99% 99%  TempSrc:      BMI (Calculated):         Discharge exam  GENERAL: No apparent distress.  Nontoxic. HEENT: MMM.  Vision and hearing grossly intact.  NECK: Supple.  No apparent JVD.  RESP:  No IWOB.  Fair aeration bilaterally. CVS:  RRR. Heart sounds normal.  ABD/GI/GU: BS+. Abd soft, NTND.  MSK/EXT: Bilateral BKA SKIN: no apparent skin lesion or wound NEURO: AA.  Oriented appropriately.  No apparent focal neuro deficit. PSYCH: Calm. Normal affect.   Discharge Instructions Discharge Instructions     Diet - low sodium heart healthy   Complete by: As directed    Diet Carb Modified   Complete by: As directed    Discharge wound care:   Complete by: As directed    Pressure Injury 02/01/24 Coccyx Right;Left Stage 1 -  Intact skin with non-blanchable redness of a localized area usually over a bony prominence. redness 19 days Pressure Injury 02/01/24 Thigh Left;Posterior;Proximal Stage 1 -  Intact skin with non-blanchable redness of a localized area usually over a bony prominence. erythema , redness   Increase activity slowly   Complete by: As directed       Allergies as of 02/21/2024       Reactions   Contrast Media [iodinated Contrast Media] Shortness Of Breath, Other (See Comments)   Difficulty breathing and altered mental status   Ivp Dye [iodinated Contrast  Media] Anaphylaxis, Shortness Of Breath, Other (See Comments)   Breathing problems, altered mental state   Latex Rash, Other (See Comments)   A severe rash appears after the first 24 hours of being placed   Tape Other (See Comments), Rash   Rash after 1 day of use Do not leave on longer then 3 days with out be changed        Medication List     STOP taking these medications    amiodarone  200 MG tablet Commonly known as: PACERONE    amLODipine  10 MG tablet Commonly known as: NORVASC    carvedilol  3.125 MG tablet Commonly known as: Coreg    hydrALAZINE  50 MG tablet Commonly known as: APRESOLINE    magnesium  oxide 400 (240 Mg) MG tablet Commonly known as: MAG-OX       TAKE these medications    apixaban  5 MG Tabs tablet Commonly known as: ELIQUIS  Take 1 tablet (5 mg total) by mouth 2 (two) times daily.   atorvastatin  80 MG tablet Commonly known as: LIPITOR  Take 1 tablet (80 mg total) by mouth at bedtime.   clopidogrel  75 MG tablet Commonly known as: PLAVIX  Take 1 tablet (75 mg total) by mouth daily with breakfast.   colchicine  0.6 MG tablet Take 0.5 tablets (0.3 mg total) by  mouth daily.   Dialyvite /Zinc  Tabs Take 1 tablet by mouth Every Tuesday,Thursday,and Saturday with dialysis.   famotidine  20 MG tablet Commonly known as: PEPCID  Take 20 mg by mouth every other day.   fentaNYL  50 MCG/HR Commonly known as: DURAGESIC  Place 1 patch onto the skin every 3 (three) days.   folic acid  1 MG tablet Commonly known as: FOLVITE  Take 1 mg by mouth in the morning.   gabapentin  100 MG capsule Commonly known as: NEURONTIN  Take 100 mg by mouth 3 (three) times daily.   isosorbide  mononitrate 30 MG 24 hr tablet Commonly known as: IMDUR  Take 1 tablet (30 mg total) by mouth daily. What changed:  medication strength how much to take   lidocaine -prilocaine  cream Commonly known as: EMLA  Apply 1 application topically See admin instructions. Prior to Dialysis days  Tuesday,Thursday and saturday   loratadine  10 MG tablet Commonly known as: CLARITIN  Take 10 mg by mouth in the morning.   mineral oil-hydrophilic petrolatum ointment Apply 1 Application topically as needed for dry skin or irritation (Upper thigh).   nitroGLYCERIN  0.4 MG SL tablet Commonly known as: NITROSTAT  Place 0.4 mg under the tongue every 5 (five) minutes as needed for chest pain.   NovoLOG  FlexPen 100 UNIT/ML FlexPen Generic drug: insulin  aspart Inject 0-12 Units into the skin See admin instructions. Inject 4 units into the skin with meals in addition to sliding scale as needed. Inject as per sliding scale: If 0 - 69 = 0 initiate hypoglycemia orders;  70 - 150 = 0;  151 - 200 = 2;  201 - 250 = 4;  251 - 300 = 6;  301 - 350 = 8;  351 - 400 = 10;  401 - 450 = 12.  If >400, give 12 units, recheck in 1 hour. If still >400, call MD/NP for further orders. Subcutaneously with meals in addition to scheduled 4 untes with meals.   oxyCODONE -acetaminophen  5-325 MG tablet Commonly known as: PERCOCET /ROXICET Take 1 tablet by mouth every 8 (eight) hours as needed for severe pain.   promethazine  12.5 MG suppository Commonly known as: PHENERGAN  Place 12.5 mg rectally daily as needed for nausea or vomiting.   rOPINIRole  2 MG tablet Commonly known as: REQUIP  Take 2 mg by mouth at bedtime.   sevelamer  carbonate 800 MG tablet Commonly known as: RENVELA  Take 2 tablets (1,600 mg total) by mouth 3 (three) times daily with meals.   tamsulosin  0.4 MG Caps capsule Commonly known as: FLOMAX  Take 0.4 mg by mouth in the morning.   vancomycin  125 MG capsule Commonly known as: VANCOCIN  Take 1 capsule (125 mg total) by mouth daily for 4 days. What changed: when to take this               Discharge Care Instructions  (From admission, onward)           Start     Ordered   02/21/24 0000  Discharge wound care:       Comments: Pressure Injury 02/01/24 Coccyx Right;Left Stage 1 -   Intact skin with non-blanchable redness of a localized area usually over a bony prominence. redness 19 days Pressure Injury 02/01/24 Thigh Left;Posterior;Proximal Stage 1 -  Intact skin with non-blanchable redness of a localized area usually over a bony prominence. erythema , redness   02/21/24 0810            Consultations: Critical care Advanced heart failure team Cardiology    Procedures/Studies: 6/5-pericardiocentesis and TVP  ECHOCARDIOGRAM LIMITED Result Date: 02/17/2024    ECHOCARDIOGRAM LIMITED REPORT   Patient Name:   PRIMO INNIS Date of Exam: 02/17/2024 Medical Rec #:  [id number]     Height:       76.0 in Accession #:    [id number]    Weight:       243.6 lb Date of Birth:  August 14, 1956     BSA:          2.409 m Patient Age:    67 years      BP:           116/49 mmHg Patient Gender: M             HR:           99 bpm. Exam Location:  Inpatient Procedure: Limited Echo (Both Spectral and Color Flow Doppler were utilized            during procedure). Indications:    Pericardial effusion I31.3  History:        Patient has prior history of Echocardiogram examinations, most                 recent 02/14/2024. COPD; Risk Factors:Hypertension.  Sonographer:    Astrid Blamer Referring Phys: [id number] [place] LEE IMPRESSIONS  1. No evidence of pericardial effusion. Moderate pleural effusion.  2. Left ventricular ejection fraction, by estimation, is 50 to 55%. The left ventricle has low normal function.  3. The mitral valve is abnormal. Severe mitral annular calcification.  4. The aortic valve is calcified. FINDINGS  Left Ventricle: Left ventricular ejection fraction, by estimation, is 50 to 55%. The left ventricle has low normal function. Pericardium: No evidence of pericardial effusion. There is no evidence of pericardial effusion. Mitral Valve: The mitral valve is abnormal. Severe mitral annular calcification. Aortic Valve: The aortic valve is calcified. Additional Comments: There is a moderate pleural  effusion.  Arta Lark Electronically signed by Arta Lark Signature Date/Time: 02/17/2024/1:55:52 PM    Final    DG CHEST PORT 1 VIEW Result Date: 02/17/2024 CLINICAL DATA:  Pleural effusion EXAM: PORTABLE CHEST 1 VIEW COMPARISON:  Chest x-ray performed February 13, 2024 FINDINGS: Left-sided approach vascular catheter with tip overlying the SVC. Small bilateral pleural effusions. Heart is enlarged. No pneumothorax. Central pulmonary vascular congestion. IMPRESSION: 1. Modest improvement in small left effusion with associated consolidation. 2. Trace right pleural effusion. 3. Central pulmonary vascular congestion. Electronically Signed   By: Reagan Camera M.D.   On: 02/17/2024 08:29   CT CHEST ABDOMEN PELVIS WO CONTRAST Result Date: 02/15/2024 CLINICAL DATA:  Follow-up pericardial drain placement. EXAM: CT CHEST, ABDOMEN AND PELVIS WITHOUT CONTRAST TECHNIQUE: Multidetector CT imaging of the chest, abdomen and pelvis was performed following the standard protocol without IV contrast. RADIATION DOSE REDUCTION: This exam was performed according to the departmental dose-optimization program which includes automated exposure control, adjustment of the mA and/or kV according to patient size and/or use of iterative reconstruction technique. COMPARISON:  CT AP from 01/31/2024.  CT 07/18/2023 FINDINGS: CT CHEST FINDINGS Cardiovascular: There is a small caliber, percutaneous catheter which enters from a left lower anterior chest wall approach and terminates in the pericardial space overlying the left ventricle. Small residual pericardial fluid noted, image 50/3 and image 44/3. This measures 5 mm in thickness over the anterior left side of heart. Small foci of gas identified within the pericardial space compatible with recent instrumentation. Aortic atherosclerosis. Three vessel coronary artery calcifications. Calcifications of the mitral and aortic  valve. Mediastinum/Nodes: Thyroid  gland, trachea, and esophagus appear  normal. Is there is a transvenous pacer which enters from a right IJ approach and terminates in the right ventricle. Left IJ catheter is identified with tip terminating in the left cysts subclavian vein just proximal to the SVC. Lungs/Pleura: Small right and moderate left pleural effusions. Complete atelectasis of the left lower lobe with partial atelectasis of the lingula. Subsegmental atelectasis scratch set compressive type atelectasis overlies the right pleural effusion. New ground-glass nodule within the anteromedial right upper lobe measures 1.6 cm. Musculoskeletal: Thoracic spondylosis. Postoperative changes within the lower cervical spine. No acute or suspicious osseous findings. CT ABDOMEN PELVIS FINDINGS Hepatobiliary: No suspicious liver lesion. Is the cholecystectomy. No significant bile duct dilatation. Pancreas: Unchanged cystic lesion along the body of pancreas measuring 2.4 x 1.6 cm, image 66/3. This is unchanged compared with 01/11/2022. No main duct dilatation or inflammation. Spleen: Normal in size without focal abnormality. Adrenals/Urinary Tract: Normal adrenal glands. No signs of obstructive uropathy. Bilateral renal cortical atrophy. Multiple small right kidney cysts are noted measuring less than 1 cm. No specific follow-up imaging recommended at this time. Urinary bladder appears decompressed. Stomach/Bowel: Stomach appears normal. No pathologic dilatation of the large or small bowel loops. The appendix is visualized and appears normal. No bowel wall thickening, inflammation or distension. Previous colonic resection with anastomotic suture chain identified at the level of the distal sigmoid colon. Sigmoid diverticulosis without signs of acute diverticulitis. Moderate retained stool identified in the rectum. Vascular/Lymphatic: Aortic atherosclerosis. No aneurysm. Multiple small retroperitoneal lymph nodes identified none of which are pathologic by size criteria. Reproductive: Prostate is  unremarkable. Other: Small volume of ascites noted within the abdomen and pelvis. In the left lower quadrant this measures 6 Hounsfield units. No loculated fluid collections identified at this time. Musculoskeletal: There is diffuse edema extending along bilateral flanks, left greater than right. Fluid in subcutaneous soft tissue stranding noted within the mons pubis, image 138/3. Extensive postoperative changes scratch status post PLIF L4 through S1. No acute or suspicious osseous findings. IMPRESSION: 1. There is a small caliber, percutaneous catheter which enters from a left lower anterior chest wall approach and terminates in the pericardial space overlying the left ventricle. Small residual pericardial fluid noted. 2. Small right and moderate left pleural effusions. Complete atelectasis of the left lower lobe with partial atelectasis of the lingula. 3. New ground-glass nodule within the anteromedial right upper lobe measures 1.6 cm. Most likely infectious or inflammatory in etiology. Initial follow-up with CT at 6-12 months is recommended to confirm persistence. If persistent, repeat CT is recommended every 2 years until 5 years of stability has been established. This recommendation follows the consensus statement: Guidelines for Management of Incidental Pulmonary Nodules Detected on CT Images: From the Fleischner Society 2017; Radiology 2017; 284:228-243. 4. Small volume of ascites within the abdomen and pelvis. 5. Diffuse edema extending along bilateral flanks, left greater than right. Fluid in subcutaneous soft tissue stranding noted within the mons pubis. 6. Unchanged cystic lesion along the body of pancreas measuring 2.4 x 1.6 cm. This is unchanged compared with 01/11/2022. 7.  Aortic Atherosclerosis (ICD10-I70.0). Electronically Signed   By: Kimberley Penman M.D.   On: 02/15/2024 10:45   ECHOCARDIOGRAM LIMITED Result Date: 02/14/2024    ECHOCARDIOGRAM LIMITED REPORT   Patient Name:   QUINTYN DOMBEK Date of  Exam: 02/14/2024 Medical Rec #:  [id number]     Height:       76.0 in Accession #:  [id number]    Weight:       252.2 lb Date of Birth:  [birth year]     BSA:          2.445 m Patient Age:    67 years      BP:           138/93 mmHg Patient Gender: M             HR:           68 bpm. Exam Location:  Inpatient Procedure: 2D Echo, Limited Echo, Cardiac Doppler and Color Doppler (Both            Spectral and Color Flow Doppler were utilized during procedure). Indications:    CHF  History:        Patient has prior history of Echocardiogram examinations, most                 recent 02/13/2024. CAD, COPD; Risk Factors:Diabetes, Dyslipidemia                 and Former Smoker.  Sonographer:    Janette Medley Referring Phys: [id number] DALTON S MCLEAN IMPRESSIONS  1. Left ventricular ejection fraction, by estimation, is 55%. The left ventricle has normal function. The left ventricle has no regional wall motion abnormalities. There is severe left ventricular hypertrophy.  2. Right ventricular systolic function is mildly reduced. The right ventricular size is mildly enlarged.  3. Left atrial size was moderately dilated.  4. No color flow done on septum.  5. Right atrial size was moderately dilated.  6. The mitral valve is abnormal. No evidence of mitral valve regurgitation. No evidence of mitral stenosis. Severe mitral annular calcification.  7. The aortic valve is tricuspid. There is moderate calcification of the aortic valve. There is moderate thickening of the aortic valve. Aortic valve regurgitation is not visualized. Aortic valve sclerosis/calcification is present, without any evidence of aortic stenosis.  8. The inferior vena cava is dilated in size with <50% respiratory variability, suggesting right atrial pressure of 15 mmHg. FINDINGS  Left Ventricle: Left ventricular ejection fraction, by estimation, is 55%. The left ventricle has normal function. The left ventricle has no regional wall motion abnormalities. The left ventricular  internal cavity size was normal in size. There is severe left ventricular hypertrophy. Right Ventricle: The right ventricular size is mildly enlarged. No increase in right ventricular wall thickness. Right ventricular systolic function is mildly reduced. Left Atrium: Left atrial size was moderately dilated. Right Atrium: Right atrial size was moderately dilated. Pericardium: There is no evidence of pericardial effusion. Mitral Valve: The mitral valve is abnormal. There is moderate thickening of the mitral valve leaflet(s). There is moderate calcification of the mitral valve leaflet(s). Severe mitral annular calcification. No evidence of mitral valve stenosis. Tricuspid Valve: The tricuspid valve is normal in structure. Tricuspid valve regurgitation is not demonstrated. No evidence of tricuspid stenosis. Aortic Valve: The aortic valve is tricuspid. There is moderate calcification of the aortic valve. There is moderate thickening of the aortic valve. Aortic valve regurgitation is not visualized. Aortic valve sclerosis/calcification is present, without any  evidence of aortic stenosis. Pulmonic Valve: The pulmonic valve was normal in structure. Pulmonic valve regurgitation is not visualized. No evidence of pulmonic stenosis. Aorta: The aortic root is normal in size and structure. Venous: The inferior vena cava is dilated in size with less than 50% respiratory variability, suggesting right atrial pressure of 15 mmHg. IAS/Shunts: The interatrial septum was not well  visualized. LEFT VENTRICLE PLAX 2D LVIDd:         4.80 cm LVIDs:         2.90 cm LV PW:         1.20 cm LV IVS:        1.60 cm LVOT diam:     2.10 cm LVOT Area:     3.46 cm  IVC IVC diam: 2.50 cm LEFT ATRIUM         Index LA diam:    4.30 cm 1.76 cm/m   SHUNTS Systemic Diam: 2.10 cm Janelle Mediate MD Electronically signed by Janelle Mediate MD Signature Date/Time: 02/14/2024/11:17:32 AM    Final    DG Abd 1 View Result Date: 02/14/2024 CLINICAL DATA:  Nasogastric  tube placement EXAM: ABDOMEN - 1 VIEW COMPARISON:  02/12/2024 FINDINGS: Nasogastric tube appears looped within the distal esophagus with its tip position within the mid esophagus above the superior margin of the examination. Transvenous pacemaker lead is now seen approaching from the superior vena cava with its tip in indeterminate position. This may be within the right ventricle, coronary sinus, or potentially the left hepatic vein. Small left pleural effusion. Nonobstructive bowel gas pattern. No gross free intraperitoneal gas. IMPRESSION: 1. Nasogastric tube appears looped within the distal esophagus with its tip position within the mid esophagus above the superior margin of the examination. 2. Transvenous pacemaker lead in indeterminate position. Correlation for appropriate device function is recommended. If indicated, this could be better assessed with CT imaging. Electronically Signed   By: Worthy Heads M.D.   On: 02/14/2024 04:16   DG Chest Port 1 View Result Date: 02/13/2024 CLINICAL DATA:  [id number] Temporary transvenous cardiac pacemaker present [id number] EXAM: PORTABLE CHEST - 1 VIEW COMPARISON:  February 12, 2024 FINDINGS: Right IJ approach catheter sheath/transvenous pacemaker terminates in the right ventricle. Left IJ approach hemodialysis catheter terminates in the region of the left brachiocephalic vein. Likely cardiac lead overlying the midline chest and right upper chest. Bilateral perihilar interstitial opacities with patchy airspace opacities in both lung bases. Small to moderate left pleural effusion. Mild cardiomegaly. Cervical fusion hardware again noted. Eight down IMPRESSION: 1. Right IJ approach transvenous pacemaker terminates in the region of the right ventricle. 2. Similarly positioned left IJ central venous catheter terminating in the region of the left brachiocephalic vein. 3. Lower lung volumes. Bilateral perihilar interstitial opacities, which may represent mild interstitial edema. Small  to moderate left pleural effusion with patchy bibasilar airspace opacities, either atelectasis or changes of developing pulmonary edema. Electronically Signed   By: Rance Burrows M.D.   On: 02/13/2024 15:59   ECHOCARDIOGRAM LIMITED Result Date: 02/13/2024    ECHOCARDIOGRAM LIMITED REPORT   Patient Name:   ERVIN HENSLEY Date of Exam: 02/13/2024 Medical Rec #:  [id number]     Height:       76.0 in Accession #:    [id number]    Weight:       262.3 lb Date of Birth:  05-08-1956     BSA:          2.486 m Patient Age:    67 years      BP:           138/93 mmHg Patient Gender: M             HR:           78 bpm. Exam Location:  Inpatient Procedure: 2D Echo and Saline Contrast Bubble Study (Both Spectral and  Color            Flow Doppler were utilized during procedure). Indications:    Cardiac Tamponade  History:        Patient has prior history of Echocardiogram examinations, most                 recent 02/13/2024. CAD, COPD, Signs/Symptoms:Shortness of Breath;                 Risk Factors:Dyslipidemia, Former Smoker and Diabetes.  Sonographer:    Kip Peon RDCS Referring Phys: [id number] [facility] PATWARDHAN  Sonographer Comments: Image acquisition challenging due to uncooperative patient. IMPRESSIONS  1. Post pericardiocentesis - marked reduction in pericardial fluid (subcentimeter). RV colapse is no longer present.  2. Left ventricular ejection fraction, by estimation, is 65 to 70%. The left ventricle has normal function. FINDINGS  Left Ventricle: Left ventricular ejection fraction, by estimation, is 65 to 70%. The left ventricle has normal function. Pericardium: Post pericardiocentesis - marked reduction in pericardial fluid (subcentimeter). RV colapse is no longer present. IAS/Shunts: Agitated saline contrast was given intravenously to evaluate for intracardiac shunting. IVC IVC diam: 2.20 cm Dorothye Gathers MD Electronically signed by Dorothye Gathers MD Signature Date/Time: 02/13/2024/11:12:14 AM    Final    CARDIAC  CATHETERIZATION Result Date: 02/13/2024 Successful temporary transvenous pacemaker placement through right intrajugular vein Successful pericardiocentesis, following 1280 cc hemorrhagic fluid drained Pericardial drain and temporary pacemaker sutured in place CRITICAL CARE Performed by: Fransico Ivy  Total critical care time: 35 minutes  Critical care time was exclusive of separately billable procedures and treating other patients.  Critical care was necessary to treat or prevent imminent or life-threatening deterioration.  Critical care was time spent personally by me on the following activities: In addition to billable procedures as above, I personally monitored and adjusted patient's vasopressor drips.  Given patient's hypotension to 70/30 mmHg, active off on Levophed  drip up to 55 mics, down to 20 mics by the end of the procedure with improvement in hemodynamics.  Patient is also on vasopressin  and epinephrine  drips. Cody Das, MD  ECHOCARDIOGRAM COMPLETE Result Date: 02/13/2024    ECHOCARDIOGRAM REPORT   Patient Name:   MIGUELANGEL KORN Date of Exam: 02/13/2024 Medical Rec #:  [id number]     Height:       76.0 in Accession #:    [id number]    Weight:       262.3 lb Date of Birth:  [birth year]     BSA:          2.486 m Patient Age:    67 years      BP:           119/70 mmHg Patient Gender: M             HR:           106 bpm. Exam Location:  Inpatient Procedure: 2D Echo, Cardiac Doppler and Color Doppler (Both Spectral and Color            Flow Doppler were utilized during procedure). Indications:    Abnormal ECG  History:        Patient has prior history of Echocardiogram examinations, most                 recent 11/24/2023.  Sonographer:    Janette Medley Referring Phys: [id number] [name] IMPRESSIONS  1. Large pericardial effusion. Findings are consistent with cardiac tamponade.  2. Left ventricular ejection fraction,  by estimation, is 60 to 65%. The left ventricle has normal function. The left  ventricle has no regional wall motion abnormalities. There is severe concentric left ventricular hypertrophy. Left ventricular diastolic  parameters are indeterminate. There is the interventricular septum is flattened in systole and diastole, consistent with right ventricular pressure and volume overload.  3. Right ventricular systolic function is normal. The right ventricular size is normal.  4. The mitral valve is normal in structure. No evidence of mitral valve regurgitation. No evidence of mitral stenosis. Severe mitral annular calcification.  5. The aortic valve is calcified. There is severe calcifcation of the aortic valve. There is severe thickening of the aortic valve. Aortic valve regurgitation is not visualized. Aortic valve sclerosis is present, with no evidence of aortic valve stenosis.  6. The inferior vena cava is dilated in size with <50% respiratory variability, suggesting right atrial pressure of 15 mmHg. Conclusion(s)/Recommendation(s): Patient proceeding with pericardiocentesis. FINDINGS  Left Ventricle: Left ventricular ejection fraction, by estimation, is 60 to 65%. The left ventricle has normal function. The left ventricle has no regional wall motion abnormalities. The left ventricular internal cavity size was normal in size. There is  severe concentric left ventricular hypertrophy. The interventricular septum is flattened in systole and diastole, consistent with right ventricular pressure and volume overload. Left ventricular diastolic parameters are indeterminate. Right Ventricle: The right ventricular size is normal. No increase in right ventricular wall thickness. Right ventricular systolic function is normal. Left Atrium: Left atrial size was normal in size. Right Atrium: Right atrial size was normal in size. Pericardium: A large pericardial effusion is present. There is diastolic collapse of the right ventricular free wall and excessive respiratory variation in the mitral valve spectral  Doppler velocities. There is evidence of cardiac tamponade. Mitral Valve: The mitral valve is normal in structure. Severe mitral annular calcification. No evidence of mitral valve regurgitation. No evidence of mitral valve stenosis. Tricuspid Valve: The tricuspid valve is normal in structure. Tricuspid valve regurgitation is not demonstrated. No evidence of tricuspid stenosis. Aortic Valve: The aortic valve is calcified. There is severe calcifcation of the aortic valve. There is severe thickening of the aortic valve. Aortic valve regurgitation is not visualized. Aortic valve sclerosis is present, with no evidence of aortic valve stenosis. Pulmonic Valve: The pulmonic valve was normal in structure. Pulmonic valve regurgitation is not visualized. No evidence of pulmonic stenosis. Aorta: The aortic root is normal in size and structure. Venous: The inferior vena cava is dilated in size with less than 50% respiratory variability, suggesting right atrial pressure of 15 mmHg. IAS/Shunts: No atrial level shunt detected by color flow Doppler.  LEFT VENTRICLE PLAX 2D LVIDd:         3.45 cm   Diastology LVIDs:         2.40 cm   LV e' medial:    7.29 cm/s LV PW:         1.50 cm   LV E/e' medial:  8.7 LV IVS:        2.15 cm   LV e' lateral:   8.16 cm/s LVOT diam:     1.90 cm   LV E/e' lateral: 7.8 LV SV:         30 LV SV Index:   12 LVOT Area:     2.84 cm  RIGHT VENTRICLE            IVC RV S prime:     7.51 cm/s  IVC diam: 2.50 cm LEFT  ATRIUM             Index        RIGHT ATRIUM           Index LA diam:        3.40 cm 1.37 cm/m   RA Area:     16.10 cm LA Vol (A2C):   44.9 ml 18.06 ml/m  RA Volume:   47.20 ml  18.99 ml/m LA Vol (A4C):   56.1 ml 22.57 ml/m LA Biplane Vol: 53.1 ml 21.36 ml/m  AORTIC VALVE LVOT Vmax:   80.20 cm/s LVOT Vmean:  51.400 cm/s LVOT VTI:    0.106 m  AORTA Ao Root diam: 3.30 cm Ao Asc diam:  3.30 cm MITRAL VALVE MV Area (PHT): 3.79 cm    SHUNTS MV Decel Time: 200 msec    Systemic VTI:  0.11 m MV E  velocity: 63.40 cm/s  Systemic Diam: 1.90 cm Dorothye Gathers MD Electronically signed by Dorothye Gathers MD Signature Date/Time: 02/13/2024/10:47:36 AM    Final    DG Abd 1 View Result Date: 02/12/2024 CLINICAL DATA:  Nasogastric tube placement. EXAM: ABDOMEN - 1 VIEW COMPARISON:  None Available. FINDINGS: Nasogastric tube terminates in the gastric antrum. Defibrillator pads overlie the lower left chest and left upper quadrant. IMPRESSION: Nasogastric tube terminates in the gastric antrum. Electronically Signed   By: Shearon Denis M.D.   On: 02/12/2024 16:46   DG CHEST PORT 1 VIEW Result Date: 02/12/2024 CLINICAL DATA:  Central line placement. EXAM: PORTABLE CHEST 1 VIEW COMPARISON:  02/11/2024 and CT chest 07/18/2023. FINDINGS: Left IJ central line is in the region of the brachiocephalic vein junction and may be directed posteriorly, within the azygous vein. No pneumothorax. Nasogastric tube is followed into the stomach with the tip projecting beyond the inferior margin of the image. A defibrillator pad overlies the lower left chest. Heart is enlarged. Thoracic aorta is calcified. Mild interstitial prominence and indistinctness. Left lower lobe collapse/consolidation. Probable left pleural effusion. IMPRESSION: 1. Left IJ central line appears to be directed posteriorly, suggesting azygous vein placement. No pneumothorax. 2. Left lower lobe collapse/consolidation. 3. Left pleural effusion. 4. Pulmonary edema. Electronically Signed   By: Shearon Denis M.D.   On: 02/12/2024 16:46   DG Chest 2 View Result Date: 02/11/2024 CLINICAL DATA:  Hypertension.  Weakness. EXAM: CHEST - 2 VIEW COMPARISON:  November 23, 2023. FINDINGS: Stable cardiomegaly. Right lung is clear. Small left pleural effusion is noted with adjacent atelectasis. Bony thorax is unremarkable. IMPRESSION: Small left pleural effusion with adjacent atelectasis. Electronically Signed   By: Rosalene Colon M.D.   On: 02/11/2024 16:37   CT ABDOMEN PELVIS WO  CONTRAST Result Date: 01/31/2024 CLINICAL DATA:  Right upper quadrant pain diarrhea EXAM: CT ABDOMEN AND PELVIS WITHOUT CONTRAST TECHNIQUE: Multidetector CT imaging of the abdomen and pelvis was performed following the standard protocol without IV contrast. RADIATION DOSE REDUCTION: This exam was performed according to the departmental dose-optimization program which includes automated exposure control, adjustment of the mA and/or kV according to patient size and/or use of iterative reconstruction technique. COMPARISON:  CT 07/18/2023 FINDINGS: Lower chest: Lung bases demonstrate mild dependent atelectasis. Trace pleural effusions. Cardiomegaly with coronary vascular calcification. Aortic valvular calcification. Mitral calcification. Hepatobiliary: Cholecystectomy. No focal hepatic abnormality or biliary dilatation. Pancreas: No inflammation. Atrophic. Stable cystic lesion at the body of the pancreas measuring about 2.0 by 1.8 cm on series 3, image 31. Spleen: Normal in size without focal abnormality. Adrenals/Urinary Tract:  Adrenal glands are normal. Atrophic kidneys without hydronephrosis. Urinary bladder is slightly thick walled with mild perivesical stranding Stomach/Bowel: Stomach nonenlarged. No dilated small bowel. Diffuse colon wall thickening with mild pericolonic stranding at the ascending colon and descending and rectosigmoid colon. Postsurgical changes at the sigmoid colon. Circumferential wall thickening at the rectum. Vascular/Lymphatic: Advanced aortic atherosclerosis without aneurysm. Multiple small retroperitoneal lymph nodes, all subcentimeter. Reproductive: Negative prostate Other: Negative for pelvic effusion or free air. Some perirectal stranding. Musculoskeletal: Spinal fusion hardware L4 through S1. No acute osseous abnormality IMPRESSION: 1. Findings consistent with diffuse proctocolitis likely infectious (? C diff) or inflammatory. No evidence for perforation. 2. Mild bladder wall  thickening with perivesical stranding, correlate with urinalysis to exclude cystitis. 3. Cardiomegaly with trace pleural effusions. 4. Atrophic kidneys. 5. Stable small cystic pancreatic lesion. 6. Aortic atherosclerosis. Aortic Atherosclerosis (ICD10-I70.0). Electronically Signed   By: Esmeralda Hedge M.D.   On: 01/31/2024 21:05       The results of significant diagnostics from this hospitalization (including imaging, microbiology, ancillary and laboratory) are listed below for reference.     Microbiology: Recent Results (from the past 240 hours)  Culture, blood (Routine x 2)     Status: None   Collection Time: 02/11/24  2:46 PM   Specimen: BLOOD RIGHT ARM  Result Value Ref Range Status   Specimen Description BLOOD RIGHT ARM  Final   Special Requests   Final    BOTTLES DRAWN AEROBIC AND ANAEROBIC Blood Culture adequate volume   Culture   Final    NO GROWTH 5 DAYS Performed at [facility], [address], [place], [place] [address]    Report Status 02/16/2024 FINAL  Final  Culture, blood (Routine x 2)     Status: None   Collection Time: 02/11/24  3:19 PM   Specimen: BLOOD  Result Value Ref Range Status   Specimen Description BLOOD RIGHT ANTECUBITAL  Final   Special Requests   Final    BOTTLES DRAWN AEROBIC AND ANAEROBIC Blood Culture adequate volume   Culture   Final    NO GROWTH 5 DAYS Performed at [facility], [address] [place], [place] [address]    Report Status 02/16/2024 FINAL  Final  Culture, group A strep     Status: None   Collection Time: 02/12/24  9:10 AM   Specimen: Throat  Result Value Ref Range Status   Specimen Description THROAT  Final   Special Requests NONE  Final   Culture   Final    NO GROUP A STREP (S.PYOGENES) ISOLATED Performed at [facility], [address], [place], [place] [address]    Report Status 02/15/2024 FINAL  Final  Body fluid culture w Gram Stain     Status: None   Collection Time: 02/13/24  9:31 AM   Specimen:  PATH Cytology Misc. fluid; Body Fluid  Result Value Ref Range Status   Specimen Description PERITONEAL  Final   Special Requests NONE  Final   Gram Stain   Final    FEW WBC PRESENT, PREDOMINANTLY PMN NO ORGANISMS SEEN    Culture   Final    NO GROWTH 3 DAYS Performed at [facility], [address] [place], [place] [address]    Report Status 02/16/2024 FINAL  Final  MRSA Next Gen by PCR, Nasal     Status: Abnormal   Collection Time: 02/13/24 12:16 PM   Specimen: Nasal Mucosa; Nasal Swab  Result Value  Ref Range Status   MRSA by PCR Next Gen DETECTED (A) NOT DETECTED Final    Comment: RESULT CALLED TO, READ BACK BY AND VERIFIED WITH: RN SANTEZ ALVAREZ ON 02/13/24 @ 1544 BY DRT (NOTE) The GeneXpert MRSA Assay (FDA approved for NASAL specimens only), is one component of a comprehensive MRSA colonization surveillance program. It is not intended to diagnose MRSA infection nor to guide or monitor treatment for MRSA infections. Test performance is not FDA approved in patients less than 82 years old. Performed at [facility], [address] [place], [place] [address]      Labs:  CBC: Recent Labs  Lab 02/17/24 0636 02/18/24 0802 02/19/24 1111 02/20/24 0633 02/21/24 0604  WBC 7.8 8.0 7.3 6.9 6.0  HGB 8.9* 9.0* 9.3* 8.8* 9.0*  HCT 28.3* 29.0* 29.1* 28.7* 29.4*  MCV 94.0 95.4 94.2 95.3 96.1  PLT 145* 140* 120* 121* 136*   BMP &GFR Recent Labs  Lab 02/15/24 0330 02/15/24 1609 02/16/24 0429 02/17/24 0636 02/18/24 0802 02/19/24 1111 02/20/24 0633 02/21/24 0604  NA 135   < > 136 135 136 134* 134* 136  K 3.9   < > 3.9 3.8 4.2 4.0 4.4 3.9  CL 102   < > 103 104 104 99 99 98  CO2 24   < > 24 22 22 25 26 27   GLUCOSE 242*   < > 191* 143* 115* 178* 109* 136*  BUN 28*   < > 22 29* 40* 25* 33* 22  CREATININE 2.60*   < > 1.91* 2.81* 4.23* 3.14* 4.24* 3.55*  CALCIUM  8.9   < > 8.5* 7.8* 8.0* 7.7* 7.6* 7.7*  MG 2.0  --  2.2  --  2.3  --   --  1.8  PHOS 2.5   < > 2.3*   --  3.5 2.9 4.0 3.3   < > = values in this interval not displayed.   Estimated Creatinine Clearance: 26.5 mL/min (A) (by C-G formula based on SCr of 3.55 mg/dL (H)). Liver & Pancreas: Recent Labs  Lab 02/16/24 0429 02/18/24 0802 02/19/24 1111 02/20/24 6045 02/21/24 0604  ALBUMIN  2.7* 2.4* 2.3* 2.3* 2.4*   No results for input(s): LIPASE, AMYLASE in the last 168 hours. No results for input(s): AMMONIA in the last 168 hours. Diabetic: No results for input(s): HGBA1C in the last 72 hours. Recent Labs  Lab 02/20/24 0930 02/20/24 1556 02/20/24 2016 02/20/24 2105 02/21/24 0613  GLUCAP 155* 130* 176* 181* 127*   Cardiac Enzymes: No results for input(s): CKTOTAL, CKMB, CKMBINDEX, TROPONINI in the last 168 hours. No results for input(s): PROBNP in the last 8760 hours. Coagulation Profile: No results for input(s): INR, PROTIME in the last 168 hours. Thyroid  Function Tests: No results for input(s): TSH, T4TOTAL, FREET4, T3FREE, THYROIDAB in the last 72 hours. Lipid Profile: No results for input(s): CHOL, HDL, LDLCALC, TRIG, CHOLHDL, LDLDIRECT in the last 72 hours. Anemia Panel: No results for input(s): VITAMINB12, FOLATE, FERRITIN, TIBC, IRON, RETICCTPCT in the last 72 hours. Urine analysis:    Component Value Date/Time   COLORURINE YELLOW 09/22/2023 1510   APPEARANCEUR CLEAR 09/22/2023 1510   LABSPEC 1.013 09/22/2023 1510   PHURINE 7.0 09/22/2023 1510   GLUCOSEU 150 (A) 09/22/2023 1510   HGBUR NEGATIVE 09/22/2023 1510   BILIRUBINUR NEGATIVE 09/22/2023 1510   KETONESUR NEGATIVE 09/22/2023 1510   PROTEINUR >=300 (A) 09/22/2023 1510   UROBILINOGEN 0.2 01/14/2015 0346   NITRITE NEGATIVE 09/22/2023 1510   LEUKOCYTESUR SMALL (A)  09/22/2023 1510   Sepsis Labs: Invalid input(s): PROCALCITONIN, LACTICIDVEN   SIGNED:  Leibish Mcgregor T Emmabelle Fear, MD  [facility] 02/21/2024, 8:11 AM

## 2024-02-21 NOTE — Progress Notes (Signed)
 Patient noticed to be sitting on top of his cellphone adaptor, noticed indentation on right buttocks, inquired with patient if he has not felt that his adaptor is under him and he stated that he didn't. Removed adaptor, skin is intact apart from noticeable redness and indentation.

## 2024-02-21 NOTE — Progress Notes (Signed)
 PT Cancellation Note  Patient Details Name: TAVITA EASTHAM MRN: [id number] DOB: 1956-01-08   Cancelled Treatment:    Reason Eval/Treat Not Completed: PT screened, no needs identified, will sign off. Pt is a resident of [facility]. At baseline, he requires up to Max assist with ADLs, a hoyer-style lift for transfers, and performs functional mobility with a w/c. Pt demonstrates functional UE ROM and strength, denies concerns about mobilizing in his MWC, and declines PT evaluation at this time. PT signing off.   Rexie Catena 02/21/2024, 12:36 PM

## 2024-02-21 NOTE — Plan of Care (Signed)
 Prepared AVS for discharge to SNF.

## 2024-02-21 NOTE — Plan of Care (Signed)
  Problem: Metabolic: Goal: Ability to maintain appropriate glucose levels will improve Outcome: Not Progressing   Problem: Fluid Volume: Goal: Ability to maintain a balanced intake and output will improve Outcome: Not Progressing   Problem: Safety: Goal: Ability to remain free from injury will improve Outcome: Not Progressing   Problem: Cardiovascular: Goal: Ability to achieve and maintain adequate cardiovascular perfusion will improve Outcome: Not Progressing Goal: Vascular access site(s) Level 0-1 will be maintained Outcome: Not Progressing

## 2024-02-21 NOTE — Progress Notes (Cosign Needed Addendum)
  Progress Note  Patient Name: [name] Date of Encounter: 02/21/2024 [facility] Cardiologist: Knox Perl, MD   Interval Summary    Patient with history of PAF, PAD s/p bilateral BKA, DM II, ESRD on HD, HTN, CAD with PCI to RCA in 2016 and NSTEMI 03/25 s/p PCI/DES to p LCX and partially successful PTCA/shockwave lithotripsy p RCA, C.diff colitis 01/25, tobacco use. Patient admitted with weakness, decreased appetite, chest pain, diarrhea, dyspnea. On day of admission, had experienced hypotension in HD. Patient found to be acutely ill with shock and required vasopressors. He was also found with pericardial effusion concerning for tamponade and underwent pericardiocentesis on 6/5. removed. Drain taken out 6/8. Patient moved out of ICU on 6/11.  Today patient feeling well and is looking forward to d/c. Denies focal cardiac complaints, no palpitations, dizziness/lightheadedness, dyspnea.  Vital Signs Vitals:   02/20/24 1946 02/20/24 2007 02/21/24 0558 02/21/24 0825  BP:  (!) 115/54 (!) 153/71   Pulse:  64 64 66  Resp:  19 19 16   Temp:  98.4 F (36.9 C) 98 F (36.7 C)   TempSrc:      SpO2: 97% 99% 99%   Weight:      Height:        Intake/Output Summary (Last 24 hours) at 02/21/2024 1138 Last data filed at 02/21/2024 0539 Gross per 24 hour  Intake 401.98 ml  Output 2000 ml  Net -1598.02 ml      02/20/2024    2:09 PM 02/20/2024    9:55 AM 02/19/2024   10:02 AM  Last 3 Weights  Weight (lbs) 239 lb 6.7 oz 243 lb 13.3 oz 243 lb 9.7 oz  Weight (kg) 108.6 kg 110.6 kg 110.5 kg      Telemetry/ECG  Rate controlled atrial flutter with variable conduction. No prolonged pauses seen - Personally Reviewed  Physical Exam  GEN: No acute distress.   Neck: No JVD Cardiac: irregularly irregular, no murmurs, rubs, or gallops.  Respiratory: Clear to auscultation bilaterally. GI: Soft, nontender, non-distended  MS: No significant edema. Bilateral BKA.  Assessment & Plan    Pericardial effusion Patient found with pericardial effusion/tamponade on admission, had pericardiocentesis on 6/5 with blood fluid removed. Drain removed on 6/8. Fluid cytology negative. Repeat limited echo on 6/9 was without significant re-accumulation. LVEF on that echo 45%.  Patient will need repeat echo upon discharge. ~4 weeks.  Atrial fibrillation with tachy-brady syndrome Patient on Coreg  and Amiodarone  PTA. Earlier this admission had pauses of up to 6 seconds prompting TVP. Fortunately, patient did not require and this was able to be removed 6/8. Rate controlled atrial flutter on overnight telemetry 6/12-6/13. Continue to avoid AV nodal agents. Amiodarone  also discontinued this admission. Patient a poor candidate for pacemaker due to significant co-morbidities and significant chronic illness.  CAD Patient with NSTEMI March 2025, had PCI with stenting to LCX and PTCA shockwave lithotripsy to proximal RCA.  Continue Plavix  and anticoagulation. Does not need ASA on Eliquis .  Continue statin    For questions or updates, please contact [facility] Please consult [email or web address] for contact info under       Signed, Leala Prince, PA-C

## 2024-02-21 NOTE — TOC Transition Note (Signed)
 Transition of Care [facility]) - Discharge Note   Patient Details  Name: [name] MRN: [id number] Date of Birth: May 06, 1956  Transition of Care [facility]) CM/SW Contact:  Elspeth Hals, LCSW Phone Number: 02/21/2024, 11:45 AM   Clinical Narrative:   Pt discharging to [facility].  RN call report to [phone].   PTAR called [phone].   1000: CSW confirmed with Hillis Lu that they can receive pt today.     Final next level of care: Skilled Nursing Facility Barriers to Discharge: Barriers Resolved   Patient Goals and CMS Choice   CMS Medicare.gov Compare Post Acute Care list provided to:: Patient Choice offered to / list presented to : Patient      Discharge Placement              Patient [facility] bed at: [facility] Patient to be transferred to facility by: ptar Name of family member notified: wife Avanell Bob Patient and family notified of of transfer: 02/21/24  Discharge Plan and Services Additional resources added to the After Visit Summary for     Discharge Planning Services: CM Consult Post Acute Care Choice: Skilled Nursing Facility                               Social Drivers of Health (SDOH) Interventions SDOH Screenings   Food Insecurity: No Food Insecurity (02/14/2024)  Housing: Low Risk  (02/14/2024)  Transportation Needs: No Transportation Needs (02/14/2024)  Utilities: Not At Risk (02/14/2024)  Social Connections: Moderately Isolated (02/14/2024)  Tobacco Use: High Risk (02/11/2024)     Readmission Risk Interventions    05/29/2023    4:41 PM 06/30/2021   11:53 AM  Readmission Risk Prevention Plan  Transportation Screening Complete Complete  Medication Review Oceanographer) Complete Complete  PCP or Specialist appointment within 3-5 days of discharge Complete   HRI or Home Care Consult Complete Complete  SW Recovery Care/Counseling Consult Complete Complete  Palliative Care Screening Not Applicable Not Applicable  Skilled Nursing Facility  Complete Not Applicable

## 2024-02-24 DIAGNOSIS — N186 End stage renal disease: Secondary | ICD-10-CM | POA: Diagnosis not present

## 2024-02-24 DIAGNOSIS — Z992 Dependence on renal dialysis: Secondary | ICD-10-CM | POA: Diagnosis not present

## 2024-02-24 DIAGNOSIS — I5030 Unspecified diastolic (congestive) heart failure: Secondary | ICD-10-CM | POA: Diagnosis not present

## 2024-02-24 DIAGNOSIS — D631 Anemia in chronic kidney disease: Secondary | ICD-10-CM | POA: Diagnosis not present

## 2024-03-02 DIAGNOSIS — Z515 Encounter for palliative care: Secondary | ICD-10-CM | POA: Diagnosis not present

## 2024-03-03 DIAGNOSIS — A0472 Enterocolitis due to Clostridium difficile, not specified as recurrent: Secondary | ICD-10-CM | POA: Diagnosis not present

## 2024-03-03 DIAGNOSIS — I10 Essential (primary) hypertension: Secondary | ICD-10-CM | POA: Diagnosis not present

## 2024-03-08 NOTE — Progress Notes (Unsigned)
 OFFICE NOTE:    Date:  03/09/2024  ID:  [name], DOB [birth year], MRN [id number] PCP: Montey Lot, PA-C  [facility] Providers Cardiologist:  Gordy Bergamo, MD       Patient Profile:  Coronary artery disease  S/p extensive PCI to RCA in 2016 w 3 overlapping DES  NSTEMI in 11/2023 s/p 3 x 16 mm DES to pLCx, partially successful PCI (Shockwave lithotripsy, high pressure Payne balloon) to mRCA reduced to 50% stenosis  LHC 11/24/2023: D1 ostial 50; ostial LCx 99 thrombotic; ostial RCA 90 ISR, mid to distal RCA 80 ISR (HFpEF) heart failure with preserved ejection fraction  TTE 11/24/2023: EF 60-65, no RWMA, mild LVH, GRII DD, normal RVSF, mild LAE, mild aortic stenosis (mean 12, V-max 237 cm/s, DI 0.34) Paroxysmal atrial fibrillation Tachy Brady syndrome Amiodarone  Rx >> DC'd during admit 02/2024 w shock, brady, pauses; req'd temp pacer; poor candidate for PPM Pericardial effusion s/p pericardiocentesis 02/2024 TTE 02/12/2024: Large pericardial effusion with tamponade, EF 60-65 Limited TTE 02/14/2024: EF 55, severe LVH, no pericardial effusion Limited TTE 02/17/2024: No pericardial effusion, EF 50-55 ESRD on hemodialysis TThSa Peripheral arterial disease S/p R BKA S/p toe amputations left foot S/p balloon angioplasty to L ATA in 06/2021 (Dr. 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 History of Present Illness [name] is a 68 y.o. male  He was last seen by Dr. Bergamo in 12/2023. He was admitted 5/23-5/27 with diarrhea presumed to be due to recurrent CDiff colitis tx with Vancomycin . He was admitted 6/3-6/13 with hypotension and bradycardia. He had pauses up to 6 seconds and was placed on vasopressors. His Amiodarone  and Carvedilol  were  stopped. He was covered for pneumonia with antibiotics. He was noted to have a large pericardial effusion on TTE with tamponade physiology. He underwent pericardiocentesis with removal of 1280 mL of bloody fluid (cytology neg for malignant cells). Drain was left in for several days then removed. He had recurrent AF w RVR and had temp pacer placed to allow for rate control. Follow up TTE showed effusion had resolved. Temp pacer was eventually removed. He had intermittent AFib/Flutter but rate was controlled. He was felt to be a very poor candidate for device implant. He was ultimately started back on anticoagulation. Plan was to repeat TTE in 4 weeks. Echocardiogram has not been ordered yet.  DC summary indicates he was placed on low dose colchicine .   He is here alone. He currently lives at [facility]. He has not had recent chest discomfort, pain, or pressure, and denies dizziness or syncope. He goes to dialysis on Tuesdays, Thursdays, and Saturdays. His blood pressure has been running high at times at his living facility. He still smokes cigarettes.   ROS-See HPI    Studies Reviewed:  EKG Interpretation Date/Time:  Monday March 09 2024 10:17:00 EDT Ventricular Rate:  78 PR Interval:  240 QRS Duration:  160 QT Interval:  456 QTC Calculation: 519 R Axis:   -59  Text Interpretation: Sinus rhythm with 1st degree A-V block Right bundle branch block Left anterior fascicular block Bifascicular block Septal infarct , age undetermined Non-specific ST-t changes Confirmed by Lelon Hamilton [id number]) on 03/09/2024 10:27:19  AM    Risk Assessment/Calculations: CHA2DS2-VASc Score = 4   This indicates a 4.8% annual risk of stroke. The patient's score is based upon: CHF History: 0 HTN History: 1 Diabetes History: 1 Stroke History: 0 Vascular Disease History: 1 Age Score: 1 Gender Score: 0    HYPERTENSION CONTROL Vitals:   03/09/24 1007 03/09/24 1719  BP: (!) 160/70 (!) 140/80    The patient's  blood pressure is elevated above target today.  In order to address the patient's elevated BP: A current anti-hypertensive medication was adjusted today.         Physical Exam:  VS:  BP (!) 140/80   Pulse 78   Wt 250 lb (113.4 kg)   SpO2 97%   BMI 30.43 kg/m        Wt Readings from Last 3 Encounters:  03/09/24 250 lb (113.4 kg)  02/20/24 239 lb 6.7 oz (108.6 kg)  02/04/24 257 lb 8 oz (116.8 kg)    Constitutional:      Appearance: Healthy appearance. Not in distress.  Pulmonary:     Breath sounds: Normal breath sounds. No wheezing. No rales.  Cardiovascular:     Normal rate. Regular rhythm.  Edema:    Peripheral edema present.    Thigh: bilateral 1+ edema of the thigh. Abdominal:     Palpations: Abdomen is soft.  Musculoskeletal:     Amputation Right Lower Extremity: Right leg is amputated below knee.     Amputation Left Lower Extremity: Left leg is amputated below knee.       Assessment and Plan:    Assessment & Plan Pericardial effusion Patient was recently admitted with cardiogenic shock in the setting of pericardial effusion with tamponade physiology and bradycardia.  He underwent pericardiocentesis with removal of > 1L of bloody fluid. Cytology was neg for malignant cells.  Limited echo prior to discharge demonstrated no recurrent effusion.  He was placed on low-dose colchicine  to prevent recurrence.   - Continue colchicine  0.3 mg daily for now.   - Arrange follow-up limited TTE in the next 2 weeks to assess for recurrent pericardial effusion. - Follow up 3 mos Paroxysmal atrial fibrillation with RVR (HCC) Tachycardia-bradycardia syndrome (HCC) As noted, he was recently admitted with cardiogenic shock in the setting of pericardial effusion with tamponade physiology as well as significant bradycardia and 6 sec pauses.  Carvedilol  and amiodarone  were both discontinued.  He did have a temporary pacer for short period of time but this was eventually discontinued.  He  had recurrent bouts of AFib during his admission. Initially his HR was elevated but was controlled prior to DC. Today, he is back in NSR. He was felt to be a poor candidate for device implantation given risk for infection.  He is currently maintaining sinus rhythm.  He is tolerating anticoagulation.   - Continue Eliquis  5 mg twice daily. Coronary artery disease involving native coronary artery of native heart without angina pectoris History of extensive PCI with overlapping DES x 3 in 2016 to the RCA.  He had a non-ST elevation myocardial infarction (NSTEMI) in 11/2023 which was treated with a drug-eluting stent to the proximal LCX and partially successful PCI with shockwave lithotripsy and high-pressure non-compliant balloon to mid RCA, resulting in 50% residual stenosis.  He is currently doing well without chest discomfort to suggest angina.  Continue isosorbide  mononitrate, as needed nitroglycerin , Lipitor  80 mg daily, Plavix  75 mg daily. Chronic heart failure with preserved ejection fraction (HCC) Volume management per dialysis.  ESRD on hemodialysis with hyperkalemia He is on Tues, Thurs, Sat dialysis.  Primary hypertension Blood pressure has been elevated.  He has had some readings in the 100 systolic range.  Amlodipine  was stopped during his recent admission in the setting of hypotension and shock.  I will increase his isosorbide  mononitrate to 60 mg daily.  If his pressure continues to run high, we can resume amlodipine . Tobacco abuse Long history of smoking since age 10. He has reduced his smoking. Cessation has been recommended.       Dispo:  Return in about 3 months (around 06/09/2024) for Routine Follow Up, w/ Dr. Ladona, or Glendia Ferrier, PA-C.  Signed, Glendia Ferrier, PA-C

## 2024-03-09 ENCOUNTER — Ambulatory Visit: Attending: Physician Assistant | Admitting: Physician Assistant

## 2024-03-09 ENCOUNTER — Encounter: Payer: Self-pay | Admitting: Physician Assistant

## 2024-03-09 VITALS — BP 140/80 | HR 78 | Wt 250.0 lb

## 2024-03-09 DIAGNOSIS — Z720 Tobacco use: Secondary | ICD-10-CM | POA: Diagnosis not present

## 2024-03-09 DIAGNOSIS — I495 Sick sinus syndrome: Secondary | ICD-10-CM

## 2024-03-09 DIAGNOSIS — E1122 Type 2 diabetes mellitus with diabetic chronic kidney disease: Secondary | ICD-10-CM | POA: Diagnosis not present

## 2024-03-09 DIAGNOSIS — N186 End stage renal disease: Secondary | ICD-10-CM | POA: Diagnosis not present

## 2024-03-09 DIAGNOSIS — I10 Essential (primary) hypertension: Secondary | ICD-10-CM | POA: Diagnosis not present

## 2024-03-09 DIAGNOSIS — I2510 Atherosclerotic heart disease of native coronary artery without angina pectoris: Secondary | ICD-10-CM | POA: Diagnosis not present

## 2024-03-09 DIAGNOSIS — I480 Paroxysmal atrial fibrillation: Secondary | ICD-10-CM

## 2024-03-09 DIAGNOSIS — I3139 Other pericardial effusion (noninflammatory): Secondary | ICD-10-CM | POA: Diagnosis not present

## 2024-03-09 DIAGNOSIS — Z91158 Patient's noncompliance with renal dialysis for other reason: Secondary | ICD-10-CM | POA: Diagnosis not present

## 2024-03-09 DIAGNOSIS — I5032 Chronic diastolic (congestive) heart failure: Secondary | ICD-10-CM | POA: Diagnosis not present

## 2024-03-09 DIAGNOSIS — Z992 Dependence on renal dialysis: Secondary | ICD-10-CM

## 2024-03-09 DIAGNOSIS — G8929 Other chronic pain: Secondary | ICD-10-CM | POA: Diagnosis not present

## 2024-03-09 NOTE — Patient Instructions (Addendum)
 Medication Instructions:  Your physician has recommended you make the following change in your medication:   1.  INCREASE the Imdur  (Isosorbide ) to 60 mg taking 1daily  *If you need a refill on your cardiac medications before your next appointment, please call your pharmacy*  Lab Work: None ordered  If you have labs (blood work) drawn today and your tests are completely normal, you will receive your results only by: MyChart Message (if you have MyChart) OR A paper copy in the mail If you have any lab test that is abnormal or we need to change your treatment, we will call you to review the results.  Testing/Procedures: Your physician has requested that you have an limited echocardiogram in 2 WEEKS. Echocardiography is a painless test that uses sound waves to create images of your heart. It provides your doctor with information about the size and shape of your heart and how well your heart's chambers and valves are working. This procedure takes approximately one hour. There are no restrictions for this procedure. Please do NOT wear cologne, perfume, aftershave, or lotions (deodorant is allowed). Please arrive 15 minutes prior to your appointment time.  Please note: We ask at that you not bring children with you during ultrasound (echo/ vascular) testing. Due to room size and safety concerns, children are not allowed in the ultrasound rooms during exams. Our front office staff cannot provide observation of children in our lobby area while testing is being conducted. An adult accompanying a patient to their appointment will only be allowed in the ultrasound room at the discretion of the ultrasound technician under special circumstances. We apologize for any inconvenience.   Follow-Up: At [facility], you and your health needs are our priority.  As part of our continuing mission to provide you with exceptional heart care, our providers are all part of one team.  This team includes your  primary Cardiologist (physician) and Advanced Practice Providers or APPs (Physician Assistants and Nurse Practitioners) who all work together to provide you with the care you need, when you need it.  Your next appointment:   2-3 month(s)  Provider:   Gordy Bergamo, MD or Glendia Ferrier, PA-C          We recommend signing up for the patient portal called MyChart.  Sign up information is provided on this After Visit Summary.  MyChart is used to connect with patients for Virtual Visits (Telemedicine).  Patients are able to view lab/test results, encounter notes, upcoming appointments, etc.  Non-urgent messages can be sent to your provider as well.   To learn more about what you can do with MyChart, go to [email or web address].   Other Instructions

## 2024-03-09 NOTE — Assessment & Plan Note (Signed)
Volume management per dialysis.  

## 2024-03-09 NOTE — Assessment & Plan Note (Signed)
 History of extensive PCI with overlapping DES x 3 in 2016 to the RCA.  He had a non-ST elevation myocardial infarction (NSTEMI) in 11/2023 which was treated with a drug-eluting stent to the proximal LCX and partially successful PCI with shockwave lithotripsy and high-pressure non-compliant balloon to mid RCA, resulting in 50% residual stenosis.  He is currently doing well without chest discomfort to suggest angina.  Continue isosorbide  mononitrate, as needed nitroglycerin , Lipitor  80 mg daily, Plavix  75 mg daily.

## 2024-03-09 NOTE — Assessment & Plan Note (Signed)
 He is on Tues, Thurs, Sat dialysis.

## 2024-03-09 NOTE — Assessment & Plan Note (Signed)
 Long history of smoking since age 68. He has reduced his smoking. Cessation has been recommended.

## 2024-03-09 NOTE — Assessment & Plan Note (Addendum)
 As noted, he was recently admitted with cardiogenic shock in the setting of pericardial effusion with tamponade physiology as well as significant bradycardia and 6 sec pauses.  Carvedilol  and amiodarone  were both discontinued.  He did have a temporary pacer for short period of time but this was eventually discontinued.  He had recurrent bouts of AFib during his admission. Initially his HR was elevated but was controlled prior to DC. Today, he is back in NSR. He was felt to be a poor candidate for device implantation given risk for infection.  He is currently maintaining sinus rhythm.  He is tolerating anticoagulation.   - Continue Eliquis  5 mg twice daily.

## 2024-03-11 ENCOUNTER — Telehealth: Payer: Self-pay | Admitting: Orthopedic Surgery

## 2024-03-11 DIAGNOSIS — E11628 Type 2 diabetes mellitus with other skin complications: Secondary | ICD-10-CM | POA: Diagnosis not present

## 2024-03-11 DIAGNOSIS — R293 Abnormal posture: Secondary | ICD-10-CM | POA: Diagnosis not present

## 2024-03-11 DIAGNOSIS — Z993 Dependence on wheelchair: Secondary | ICD-10-CM | POA: Diagnosis not present

## 2024-03-11 DIAGNOSIS — Z89512 Acquired absence of left leg below knee: Secondary | ICD-10-CM | POA: Diagnosis not present

## 2024-03-11 DIAGNOSIS — Y835 Amputation of limb(s) as the cause of abnormal reaction of the patient, or of later complication, without mention of misadventure at the time of the procedure: Secondary | ICD-10-CM | POA: Diagnosis not present

## 2024-03-11 NOTE — Telephone Encounter (Signed)
 Pt called stating he just called and left a message for the dr and gave the wrong number. Dont see message that pt called. Assume pt want a call back from [name]'s office. Pt did not leave a reason for call back. Pt phone number is [phone].

## 2024-03-13 DIAGNOSIS — Z993 Dependence on wheelchair: Secondary | ICD-10-CM | POA: Diagnosis not present

## 2024-03-13 DIAGNOSIS — Y835 Amputation of limb(s) as the cause of abnormal reaction of the patient, or of later complication, without mention of misadventure at the time of the procedure: Secondary | ICD-10-CM | POA: Diagnosis not present

## 2024-03-13 DIAGNOSIS — E11628 Type 2 diabetes mellitus with other skin complications: Secondary | ICD-10-CM | POA: Diagnosis not present

## 2024-03-13 DIAGNOSIS — R293 Abnormal posture: Secondary | ICD-10-CM | POA: Diagnosis not present

## 2024-03-13 DIAGNOSIS — Z89512 Acquired absence of left leg below knee: Secondary | ICD-10-CM | POA: Diagnosis not present

## 2024-03-16 DIAGNOSIS — Z993 Dependence on wheelchair: Secondary | ICD-10-CM | POA: Diagnosis not present

## 2024-03-16 DIAGNOSIS — E11628 Type 2 diabetes mellitus with other skin complications: Secondary | ICD-10-CM | POA: Diagnosis not present

## 2024-03-16 DIAGNOSIS — Z89512 Acquired absence of left leg below knee: Secondary | ICD-10-CM | POA: Diagnosis not present

## 2024-03-16 DIAGNOSIS — R293 Abnormal posture: Secondary | ICD-10-CM | POA: Diagnosis not present

## 2024-03-16 DIAGNOSIS — Y835 Amputation of limb(s) as the cause of abnormal reaction of the patient, or of later complication, without mention of misadventure at the time of the procedure: Secondary | ICD-10-CM | POA: Diagnosis not present

## 2024-03-18 ENCOUNTER — Emergency Department (HOSPITAL_COMMUNITY)
Admission: EM | Admit: 2024-03-18 | Discharge: 2024-03-19 | Disposition: A | Discharge status: Home / Self Care | Attending: Emergency Medicine | Admitting: Emergency Medicine

## 2024-03-18 ENCOUNTER — Encounter: Payer: Self-pay | Admitting: Physician Assistant

## 2024-03-18 ENCOUNTER — Ambulatory Visit (HOSPITAL_COMMUNITY)
Admission: RE | Admit: 2024-03-18 | Discharge: 2024-03-18 | Disposition: A | Discharge status: Home / Self Care | Source: Ambulatory Visit | Attending: Physician Assistant | Admitting: Physician Assistant

## 2024-03-18 ENCOUNTER — Encounter (HOSPITAL_COMMUNITY): Payer: Self-pay

## 2024-03-18 ENCOUNTER — Ambulatory Visit: Payer: Self-pay | Admitting: Physician Assistant

## 2024-03-18 ENCOUNTER — Emergency Department (HOSPITAL_COMMUNITY)

## 2024-03-18 DIAGNOSIS — I5032 Chronic diastolic (congestive) heart failure: Secondary | ICD-10-CM

## 2024-03-18 DIAGNOSIS — J90 Pleural effusion, not elsewhere classified: Secondary | ICD-10-CM | POA: Diagnosis not present

## 2024-03-18 DIAGNOSIS — Z7901 Long term (current) use of anticoagulants: Secondary | ICD-10-CM | POA: Insufficient documentation

## 2024-03-18 DIAGNOSIS — I350 Nonrheumatic aortic (valve) stenosis: Secondary | ICD-10-CM | POA: Insufficient documentation

## 2024-03-18 DIAGNOSIS — Z992 Dependence on renal dialysis: Secondary | ICD-10-CM | POA: Diagnosis not present

## 2024-03-18 DIAGNOSIS — R410 Disorientation, unspecified: Secondary | ICD-10-CM | POA: Insufficient documentation

## 2024-03-18 DIAGNOSIS — I4510 Unspecified right bundle-branch block: Secondary | ICD-10-CM | POA: Diagnosis not present

## 2024-03-18 DIAGNOSIS — N186 End stage renal disease: Secondary | ICD-10-CM | POA: Insufficient documentation

## 2024-03-18 DIAGNOSIS — I4430 Unspecified atrioventricular block: Secondary | ICD-10-CM | POA: Diagnosis not present

## 2024-03-18 DIAGNOSIS — I120 Hypertensive chronic kidney disease with stage 5 chronic kidney disease or end stage renal disease: Secondary | ICD-10-CM | POA: Diagnosis not present

## 2024-03-18 DIAGNOSIS — Z794 Long term (current) use of insulin: Secondary | ICD-10-CM | POA: Insufficient documentation

## 2024-03-18 DIAGNOSIS — I4891 Unspecified atrial fibrillation: Secondary | ICD-10-CM | POA: Insufficient documentation

## 2024-03-18 DIAGNOSIS — I3139 Other pericardial effusion (noninflammatory): Secondary | ICD-10-CM

## 2024-03-18 DIAGNOSIS — Z91040 Latex allergy status: Secondary | ICD-10-CM | POA: Diagnosis not present

## 2024-03-18 DIAGNOSIS — J9811 Atelectasis: Secondary | ICD-10-CM | POA: Diagnosis not present

## 2024-03-18 DIAGNOSIS — R0989 Other specified symptoms and signs involving the circulatory and respiratory systems: Secondary | ICD-10-CM | POA: Diagnosis not present

## 2024-03-18 DIAGNOSIS — R531 Weakness: Secondary | ICD-10-CM | POA: Diagnosis not present

## 2024-03-18 DIAGNOSIS — I10 Essential (primary) hypertension: Secondary | ICD-10-CM | POA: Diagnosis not present

## 2024-03-18 LAB — ECHOCARDIOGRAM LIMITED
AR max vel: 1.01 cm2
AV Area VTI: 1.08 cm2
AV Area mean vel: 0.97 cm2
AV Mean grad: 12 mmHg
AV Peak grad: 22.1 mmHg
Ao pk vel: 2.35 m/s
S' Lateral: 3.5 cm

## 2024-03-18 NOTE — ED Provider Notes (Signed)
 [place] EMERGENCY DEPARTMENT AT [facility] Provider Note   CSN: [id number] Arrival date & time: 03/18/24  2327     Patient presents with: Weakness (Pt arrived by ems d/t missing dialysis yesterday. Per ems, pt is now more confused with worsening weakness. Per ems, pt has hx of refusing dialysis and has lower legs amputated.)   [name] is a 68 y.o. male.  {Add pertinent medical, surgical, social history, OB history to YEP:67052} The history is provided by the patient and medical records.  Weakness  68 year old male with history of peripheral arterial disease, hyperlipidemia, GERD, end-stage renal disease on hemodialysis, A-fib on Eliquis , presenting to the ED with generalized weakness and not feeling well.  Patient has missed his last 2 dialysis sessions-- Tuesday and Saturday.  States he missed because he was having bowel movements issues.  He does apparently have an issue with HD compliance.  He denies any chest pain or SOB.  No fever, chills, vomiting, or abdominal pain.  Prior to Admission medications   Medication Sig Start Date End Date Taking? Authorizing Provider  apixaban  (ELIQUIS ) 5 MG TABS tablet Take 1 tablet (5 mg total) by mouth 2 (two) times daily. 01/14/22 03/09/24  Gonfa, Taye T, MD  atorvastatin  (LIPITOR ) 80 MG tablet Take 1 tablet (80 mg total) by mouth at bedtime. 11/25/23   Amoako, Prince, MD  B Complex-C-Zn-Folic Acid  (DIALYVITE /ZINC ) TABS Take 1 tablet by mouth Every Tuesday,Thursday,and Saturday with dialysis. 10/04/21   [provider]  clopidogrel  (PLAVIX ) 75 MG tablet Take 1 tablet (75 mg total) by mouth daily with breakfast. 11/25/23 11/24/24  Renne Homans, MD  colchicine  0.6 MG tablet Take 0.5 tablets (0.3 mg total) by mouth daily. 02/21/24   Gonfa, Taye T, MD  famotidine  (PEPCID ) 20 MG tablet Take 20 mg by mouth every other day.    [provider]  fentaNYL  (DURAGESIC ) 50 MCG/HR Place 1 patch onto the skin every 3 (three) days.  07/25/23   [provider]  folic acid  (FOLVITE ) 1 MG tablet Take 1 mg by mouth in the morning. 09/18/21   [provider]  gabapentin  (NEURONTIN ) 100 MG capsule Take 100 mg by mouth 3 (three) times daily.    [provider]  insulin  aspart (NOVOLOG  FLEXPEN) 100 UNIT/ML FlexPen Inject 0-12 Units into the skin See admin instructions. Inject 4 units into the skin with meals in addition to sliding scale as needed. Inject as per sliding scale: If 0 - 69 = 0 initiate hypoglycemia orders;  70 - 150 = 0;  151 - 200 = 2;  201 - 250 = 4;  251 - 300 = 6;  301 - 350 = 8;  351 - 400 = 10;  401 - 450 = 12.  If >400, give 12 units, recheck in 1 hour. If still >400, call MD/NP for further orders. Subcutaneously with meals in addition to scheduled 4 untes with meals.    [provider]  isosorbide  mononitrate (IMDUR ) 30 MG 24 hr tablet Take 1 tablet (30 mg total) by mouth daily. 02/21/24 05/21/24  Gonfa, Taye T, MD  lidocaine -prilocaine  (EMLA ) cream Apply 1 application topically See admin instructions. Prior to Dialysis days Tuesday,Thursday and saturday 06/06/21   [provider]  loratadine  (CLARITIN ) 10 MG tablet Take 10 mg by mouth in the morning.    [provider]  mineral oil-hydrophilic petrolatum (AQUAPHOR) ointment Apply 1 Application topically as needed for dry skin or irritation (Upper thigh).  [provider]  nitroGLYCERIN  (NITROSTAT ) 0.4 MG SL tablet Place 0.4 mg under the tongue every 5 (five) minutes as needed for chest pain. 01/06/22   [provider]  oxyCODONE -acetaminophen  (PERCOCET /ROXICET) 5-325 MG tablet Take 1 tablet by mouth every 8 (eight) hours as needed for severe pain. 05/31/23   Ghimire, Donalda HERO, MD  promethazine  (PHENERGAN ) 12.5 MG suppository Place 12.5 mg rectally daily as needed for nausea or vomiting.    [provider]  rOPINIRole  (REQUIP ) 2 MG tablet Take 2 mg by mouth at bedtime.    [provider]  sevelamer  carbonate (RENVELA ) 800 MG tablet Take 2 tablets (1,600 mg total) by mouth 3 (three) times daily with meals. 02/04/24   Caleen Qualia, MD  tamsulosin  (FLOMAX ) 0.4 MG CAPS capsule Take 0.4 mg by mouth in the morning.    [provider]    Allergies: Contrast media [iodinated contrast media], Ivp dye [iodinated contrast media], Latex, and Tape    Review of Systems  Neurological:  Positive for weakness.  All other systems reviewed and are negative.   Updated Vital Signs There were no vitals taken for this visit.  Physical Exam Vitals and nursing note reviewed.  Constitutional:      Appearance: He is well-developed.  HENT:     Head: Normocephalic and atraumatic.  Eyes:     Conjunctiva/sclera: Conjunctivae normal.     Pupils: Pupils are equal, round, and reactive to light.  Cardiovascular:     Rate and Rhythm: Normal rate and regular rhythm.     Heart sounds: Normal heart sounds.  Pulmonary:     Effort: Pulmonary effort is normal.     Breath sounds: Normal breath sounds.  Abdominal:     General: Bowel sounds are normal.     Palpations: Abdomen is soft.  Musculoskeletal:        General: Normal range of motion.     Cervical back: Normal range of motion.     Comments: Bilateral BKA's  Skin:    General: Skin is warm and dry.  Neurological:     Mental Status: He is alert and oriented to person, place, and time.     (all labs ordered are listed, but only abnormal results are displayed) Labs Reviewed  CBC WITH DIFFERENTIAL/PLATELET  BASIC METABOLIC PANEL WITH GFR    EKG: None  Radiology: ECHOCARDIOGRAM LIMITED Result Date: 03/18/2024    ECHOCARDIOGRAM LIMITED REPORT   Patient Name:   [name] Date of Exam: 03/18/2024 Medical Rec #:  [id number]     Height:       76.0 in Accession #:    [id number]    Weight:       250.0 lb Date of Birth:  [birth year]     BSA:          2.436 m Patient Age:    67 years      BP:           181/92 mmHg Patient  Gender: M             HR:           73 bpm. Exam Location:  [address] Procedure: Limited Echo, Cardiac Doppler and Color Doppler (Both Spectral and            Color Flow Doppler were utilized during procedure). Indications:     Pericardial Effusion I31.39  CHF with preserved EF I50.32  History:         Patient has prior history of Echocardiogram examinations. CHF,                  PAD and COPD, Signs/Symptoms:Hypertensive Heart Disease; Risk                  Factors:Hypertension and Diabetes. Hyperlipidemia, H/o Hiatal                  hernia                  Patient's echo was done in a wheelchair. IVC was not                  visualized.  Sonographer:     Cherene Ravens Referring Phys:  [id number] GLENDIA DASEN WEAVER Diagnosing Phys: Wilbert Bihari MD IMPRESSIONS  1. Poor images at patient's echo was performed while sitting in a wheelchair.  2. Left ventricular ejection fraction, by estimation, is 55 to 60%. The left ventricle has normal function. Left ventricular endocardial border not optimally defined to evaluate regional wall motion. There is moderate concentric left ventricular hypertrophy. Left ventricular diastolic function could not be evaluated.  3. Right ventricular systolic function is normal. The right ventricular size is normal.  4. Left atrial size was moderately dilated.  5. The mitral valve is degenerative. Mild mitral valve regurgitation. No evidence of mitral stenosis. Moderate mitral annular calcification.  6. The aortic valve is calcified. There is moderate calcification of the aortic valve. There is moderate thickening of the aortic valve. Aortic valve regurgitation is not visualized. Mild to moderate aortic valve stenosis. Aortic valve area, by VTI measures 1.08 cm. Aortic valve mean gradient measures 12.0 mmHg. Aortic valve Vmax measures 2.35 m/s. Although the VMax and mean AVG are c/w mild AS, the DVI and SVI are low. Findings most c/w at least mild to moderate low flow low gradient  aortic stenosis.  7. Aortic dilatation noted. There is mild dilatation of the aortic root, measuring 39 mm.  8. Moderate pleural effusion in the left lateral region.  9. Trivial pericardial effusion is present. The pericardial effusion is posterior to the left ventricle. FINDINGS  Left Ventricle: Left ventricular ejection fraction, by estimation, is 55 to 60%. The left ventricle has normal function. Left ventricular endocardial border not optimally defined to evaluate regional wall motion. The left ventricular internal cavity size was normal in size. There is moderate concentric left ventricular hypertrophy. Left ventricular diastolic function could not be evaluated. Right Ventricle: The right ventricular size is normal. No increase in right ventricular wall thickness. Right ventricular systolic function is normal. Left Atrium: Left atrial size was moderately dilated. Right Atrium: Right atrial size was normal in size. Pericardium: Trivial pericardial effusion is present. The pericardial effusion is posterior to the left ventricle. Mitral Valve: The mitral valve is degenerative in appearance. There is mild thickening of the mitral valve leaflet(s). There is moderate calcification of the mitral valve leaflet(s). Moderate mitral annular calcification. Mild mitral valve regurgitation.  No evidence of mitral valve stenosis. Tricuspid Valve: The tricuspid valve is normal in structure. Tricuspid valve regurgitation is trivial. No evidence of tricuspid stenosis. Aortic Valve: The aortic valve is calcified. There is moderate calcification of the aortic valve. There is moderate thickening of the aortic valve. Aortic valve regurgitation is not visualized. Mild to moderate aortic stenosis is present. Aortic valve mean gradient measures 12.0 mmHg. Aortic valve  peak gradient measures 22.1 mmHg. Aortic valve area, by VTI measures 1.08 cm. Pulmonic Valve: The pulmonic valve was normal in structure. Pulmonic valve regurgitation is  trivial. No evidence of pulmonic stenosis. Aorta: Aortic dilatation noted. There is mild dilatation of the aortic root, measuring 39 mm. Venous: The inferior vena cava was not well visualized. IAS/Shunts: No atrial level shunt detected by color flow Doppler. Additional Comments: There is a moderate pleural effusion in the left lateral region.  LEFT VENTRICLE PLAX 2D LVIDd:         5.10 cm LVIDs:         3.50 cm LV PW:         1.60 cm LV IVS:        1.40 cm LVOT diam:     2.00 cm LV SV:         49 LV SV Index:   20 LVOT Area:     3.14 cm  RIGHT VENTRICLE RV S prime:     4.24 cm/s TAPSE (M-mode): 5.9 cm LEFT ATRIUM              Index LA diam:        4.80 cm  1.97 cm/m LA Vol (A2C):   121.0 ml 49.68 ml/m LA Vol (A4C):   77.4 ml  31.78 ml/m LA Biplane Vol: 97.8 ml  40.15 ml/m  AORTIC VALVE AV Area (Vmax):    1.01 cm AV Area (Vmean):   0.97 cm AV Area (VTI):     1.08 cm AV Vmax:           235.00 cm/s AV Vmean:          159.500 cm/s AV VTI:            0.452 m AV Peak Grad:      22.1 mmHg AV Mean Grad:      12.0 mmHg LVOT Vmax:         75.60 cm/s LVOT Vmean:        49.300 cm/s LVOT VTI:          0.156 m LVOT/AV VTI ratio: 0.35  AORTA Ao Root diam: 3.90 cm Ao Asc diam:  3.50 cm TRICUSPID VALVE TR Peak grad:   15.8 mmHg TR Vmax:        199.00 cm/s  SHUNTS Systemic VTI:  0.16 m Systemic Diam: 2.00 cm Wilbert Bihari MD Electronically signed by Wilbert Bihari MD Signature Date/Time: 03/18/2024/12:32:27 PM    Final (Updated)     {Document cardiac monitor, telemetry assessment procedure when appropriate:32947} Procedures   Medications Ordered in the ED - No data to display    {Click here for ABCD2, HEART and other calculators REFRESH Note before signing:1}                              Medical Decision Making Amount and/or Complexity of Data Reviewed Labs: ordered. Radiology: ordered. ECG/medicine tests: ordered.   ***  {Document critical care time when appropriate  Document review of labs and clinical  decision tools ie CHADS2VASC2, etc  Document your independent review of radiology images and any outside records  Document your discussion with family members, caretakers and with consultants  Document social determinants of health affecting pt's care  Document your decision making why or why not admission, treatments were needed:32947:::1}   Final diagnoses:  None    ED Discharge Orders     None

## 2024-03-19 DIAGNOSIS — R410 Disorientation, unspecified: Secondary | ICD-10-CM | POA: Diagnosis not present

## 2024-03-19 DIAGNOSIS — Z7401 Bed confinement status: Secondary | ICD-10-CM | POA: Diagnosis not present

## 2024-03-19 DIAGNOSIS — R531 Weakness: Secondary | ICD-10-CM | POA: Diagnosis not present

## 2024-03-19 DIAGNOSIS — Z743 Need for continuous supervision: Secondary | ICD-10-CM | POA: Diagnosis not present

## 2024-03-19 DIAGNOSIS — R4182 Altered mental status, unspecified: Secondary | ICD-10-CM | POA: Diagnosis not present

## 2024-03-19 LAB — CBC WITH DIFFERENTIAL/PLATELET
Abs Immature Granulocytes: 0.02 K/uL (ref 0.00–0.07)
Basophils Absolute: 0.1 K/uL (ref 0.0–0.1)
Basophils Relative: 1 %
Eosinophils Absolute: 0.4 K/uL (ref 0.0–0.5)
Eosinophils Relative: 5 %
HCT: 30.4 % — ABNORMAL LOW (ref 39.0–52.0)
Hemoglobin: 9.2 g/dL — ABNORMAL LOW (ref 13.0–17.0)
Immature Granulocytes: 0 %
Lymphocytes Relative: 10 %
Lymphs Abs: 0.7 K/uL (ref 0.7–4.0)
MCH: 30 pg (ref 26.0–34.0)
MCHC: 30.3 g/dL (ref 30.0–36.0)
MCV: 99 fL (ref 80.0–100.0)
Monocytes Absolute: 0.4 K/uL (ref 0.1–1.0)
Monocytes Relative: 6 %
Neutro Abs: 5.6 K/uL (ref 1.7–7.7)
Neutrophils Relative %: 78 %
Platelets: 138 K/uL — ABNORMAL LOW (ref 150–400)
RBC: 3.07 MIL/uL — ABNORMAL LOW (ref 4.22–5.81)
RDW: 17.1 % — ABNORMAL HIGH (ref 11.5–15.5)
WBC: 7.1 K/uL (ref 4.0–10.5)
nRBC: 0 % (ref 0.0–0.2)

## 2024-03-19 LAB — HEPATITIS B SURFACE ANTIGEN: Hepatitis B Surface Ag: NONREACTIVE

## 2024-03-19 LAB — BASIC METABOLIC PANEL WITH GFR
Anion gap: 15 (ref 5–15)
BUN: 54 mg/dL — ABNORMAL HIGH (ref 8–23)
CO2: 22 mmol/L (ref 22–32)
Calcium: 8.3 mg/dL — ABNORMAL LOW (ref 8.9–10.3)
Chloride: 103 mmol/L (ref 98–111)
Creatinine, Ser: 7.33 mg/dL — ABNORMAL HIGH (ref 0.61–1.24)
GFR, Estimated: 7 mL/min — ABNORMAL LOW (ref >60)
Glucose, Bld: 82 mg/dL (ref 70–99)
Potassium: 5.7 mmol/L — ABNORMAL HIGH (ref 3.5–5.1)
Sodium: 140 mmol/L (ref 135–145)

## 2024-03-19 MED ORDER — ANTICOAGULANT SODIUM CITRATE 4% (200MG/5ML) IV SOLN
5.0000 mL | Status: DC | PRN
Start: 2024-03-19 — End: 2024-03-19

## 2024-03-19 MED ORDER — HEPARIN SODIUM (PORCINE) 1000 UNIT/ML DIALYSIS: 1000.0000 [IU] | INTRAMUSCULAR | Status: DC | PRN

## 2024-03-19 MED ORDER — LIDOCAINE HCL (PF) 1 % IJ SOLN
5.0000 mL | INTRAMUSCULAR | Status: DC | PRN
Start: 2024-03-19 — End: 2024-03-19

## 2024-03-19 MED ORDER — PENTAFLUOROPROP-TETRAFLUOROETH EX AERO
1.0000 | INHALATION_SPRAY | CUTANEOUS | Status: DC | PRN
Start: 2024-03-19 — End: 2024-03-19

## 2024-03-19 MED ORDER — SODIUM ZIRCONIUM CYCLOSILICATE 10 G PO PACK
10.0000 g | PACK | Freq: Once | ORAL | Status: AC
  Administered 2024-03-19: 10 g via ORAL
  Filled 2024-03-19: qty 1

## 2024-03-19 MED ORDER — LIDOCAINE-PRILOCAINE 2.5-2.5 % EX CREA
1.0000 | TOPICAL_CREAM | CUTANEOUS | Status: DC | PRN
Start: 2024-03-19 — End: 2024-03-19

## 2024-03-19 MED ORDER — ALTEPLASE 2 MG IJ SOLR
2.0000 mg | Freq: Once | INTRAMUSCULAR | Status: DC | PRN
Start: 2024-03-19 — End: 2024-03-19

## 2024-03-19 MED ORDER — CHLORHEXIDINE GLUCONATE CLOTH 2 % EX PADS: 6.0000 | MEDICATED_PAD | Freq: Every day | CUTANEOUS | Status: DC

## 2024-03-19 NOTE — Procedures (Signed)
 Asked to see this patient for hospital dialysis. Pt had missed 1-2 HD sessions, K+ 5.9 and feeling weak+ confusion. Pt was seen in HD unit. Pt is alert and interacts normally. States is feeling better. Asking for pain medications. Will be sent back to ED after HD.    I was present at the procedure, reviewed the HD regimen and made appropriate changes.   Myer Fret MD  [facility] 03/19/2024, 12:43 PM

## 2024-03-19 NOTE — Progress Notes (Signed)
 Informed of patient in ER. Discussed with Olam Slocumb PA-C. Presents with not feeling well and weakness+confusion. Missed HD. On room air, K 5.7. Last outpatient HD was on 03/14/24, missed since then. Outpatient HD orders: [place]. TTS. 4hrs 15 min. F180. 108kg. 2k, 2cal. LUE AVF flow rates: 450/800. UF Profile #2. Heparin : none. Meds: Venofer  50mg  weekly, hectorol  2mcg three times per week, mircera 200mcg every 2 weeks (last dose 7/5).  Recommendations: -will arrange for HD 1st shift in AM -agree with med management for hyperK in the meantime: lokelma  -to be seen in AM -please call with any questions/concerns in the meantime  Ephriam Stank, MD [facility]

## 2024-03-19 NOTE — Procedures (Signed)
 Received patient in bed to unit.  Alert and oriented.  Informed consent signed and in chart.   TX duration: 3.5 hours  Patient tolerated well.  Transported back to the ED.  Alert, without acute distress.  Hand-off given to patient's nurse.   Access used: left arm avf Access issues: none  Total UF removed: 4 liters  Greig Silvan, RN [facility]

## 2024-03-19 NOTE — Telephone Encounter (Signed)
 This message was sent in error the pt was not called he is in the hospital after missing 2 dialysis days. Dr. Harden aware.

## 2024-03-19 NOTE — Discharge Instructions (Signed)
 Resume your usual dialysis schedule, return for new or worsening symptoms.

## 2024-03-20 DIAGNOSIS — Z515 Encounter for palliative care: Secondary | ICD-10-CM | POA: Diagnosis not present

## 2024-03-20 LAB — HEPATITIS B SURFACE ANTIBODY, QUANTITATIVE: Hep B S AB Quant (Post): 11.3 m[IU]/mL

## 2024-03-20 NOTE — Progress Notes (Signed)
 Late Note Entry- March 20, 2024  Contacted [facility] to be advised pt came to ED yesterday, had HD, and was then d/c. Clinic advised to expect pt tomorrow.   Randine Mungo Renal Navigator [phone]

## 2024-03-21 DIAGNOSIS — Z993 Dependence on wheelchair: Secondary | ICD-10-CM | POA: Diagnosis not present

## 2024-03-21 DIAGNOSIS — R293 Abnormal posture: Secondary | ICD-10-CM | POA: Diagnosis not present

## 2024-03-21 DIAGNOSIS — Z89512 Acquired absence of left leg below knee: Secondary | ICD-10-CM | POA: Diagnosis not present

## 2024-03-21 DIAGNOSIS — E11628 Type 2 diabetes mellitus with other skin complications: Secondary | ICD-10-CM | POA: Diagnosis not present

## 2024-03-21 DIAGNOSIS — Y835 Amputation of limb(s) as the cause of abnormal reaction of the patient, or of later complication, without mention of misadventure at the time of the procedure: Secondary | ICD-10-CM | POA: Diagnosis not present

## 2024-03-23 DIAGNOSIS — Z91158 Patient's noncompliance with renal dialysis for other reason: Secondary | ICD-10-CM | POA: Diagnosis not present

## 2024-03-23 DIAGNOSIS — N186 End stage renal disease: Secondary | ICD-10-CM | POA: Diagnosis not present

## 2024-03-24 ENCOUNTER — Encounter (HOSPITAL_COMMUNITY): Payer: Self-pay

## 2024-03-24 ENCOUNTER — Inpatient Hospital Stay (HOSPITAL_COMMUNITY)
Admission: EM | Admit: 2024-03-24 | Discharge: 2024-03-28 | DRG: 592 | Disposition: A | Discharge status: Skilled Nursing Facility | Attending: Internal Medicine | Admitting: Internal Medicine

## 2024-03-24 ENCOUNTER — Other Ambulatory Visit: Payer: Self-pay

## 2024-03-24 DIAGNOSIS — E876 Hypokalemia: Secondary | ICD-10-CM | POA: Diagnosis present

## 2024-03-24 DIAGNOSIS — A0471 Enterocolitis due to Clostridium difficile, recurrent: Secondary | ICD-10-CM | POA: Diagnosis present

## 2024-03-24 DIAGNOSIS — Z91040 Latex allergy status: Secondary | ICD-10-CM

## 2024-03-24 DIAGNOSIS — L89312 Pressure ulcer of right buttock, stage 2: Secondary | ICD-10-CM | POA: Diagnosis present

## 2024-03-24 DIAGNOSIS — Z91199 Patient's noncompliance with other medical treatment and regimen due to unspecified reason: Secondary | ICD-10-CM

## 2024-03-24 DIAGNOSIS — Z8249 Family history of ischemic heart disease and other diseases of the circulatory system: Secondary | ICD-10-CM

## 2024-03-24 DIAGNOSIS — D631 Anemia in chronic kidney disease: Secondary | ICD-10-CM | POA: Diagnosis present

## 2024-03-24 DIAGNOSIS — I2510 Atherosclerotic heart disease of native coronary artery without angina pectoris: Secondary | ICD-10-CM | POA: Diagnosis present

## 2024-03-24 DIAGNOSIS — Z9049 Acquired absence of other specified parts of digestive tract: Secondary | ICD-10-CM

## 2024-03-24 DIAGNOSIS — Z96651 Presence of right artificial knee joint: Secondary | ICD-10-CM | POA: Diagnosis present

## 2024-03-24 DIAGNOSIS — E785 Hyperlipidemia, unspecified: Secondary | ICD-10-CM | POA: Diagnosis present

## 2024-03-24 DIAGNOSIS — N3000 Acute cystitis without hematuria: Secondary | ICD-10-CM | POA: Diagnosis not present

## 2024-03-24 DIAGNOSIS — Z955 Presence of coronary angioplasty implant and graft: Secondary | ICD-10-CM

## 2024-03-24 DIAGNOSIS — Z993 Dependence on wheelchair: Secondary | ICD-10-CM | POA: Diagnosis not present

## 2024-03-24 DIAGNOSIS — R197 Diarrhea, unspecified: Secondary | ICD-10-CM | POA: Diagnosis not present

## 2024-03-24 DIAGNOSIS — L89159 Pressure ulcer of sacral region, unspecified stage: Secondary | ICD-10-CM | POA: Diagnosis present

## 2024-03-24 DIAGNOSIS — L89896 Pressure-induced deep tissue damage of other site: Secondary | ICD-10-CM | POA: Diagnosis present

## 2024-03-24 DIAGNOSIS — Z89611 Acquired absence of right leg above knee: Secondary | ICD-10-CM

## 2024-03-24 DIAGNOSIS — Z79899 Other long term (current) drug therapy: Secondary | ICD-10-CM

## 2024-03-24 DIAGNOSIS — F17210 Nicotine dependence, cigarettes, uncomplicated: Secondary | ICD-10-CM | POA: Diagnosis present

## 2024-03-24 DIAGNOSIS — L89152 Pressure ulcer of sacral region, stage 2: Secondary | ICD-10-CM | POA: Diagnosis not present

## 2024-03-24 DIAGNOSIS — Z7901 Long term (current) use of anticoagulants: Secondary | ICD-10-CM

## 2024-03-24 DIAGNOSIS — R293 Abnormal posture: Secondary | ICD-10-CM | POA: Diagnosis not present

## 2024-03-24 DIAGNOSIS — G8929 Other chronic pain: Secondary | ICD-10-CM | POA: Diagnosis present

## 2024-03-24 DIAGNOSIS — Z992 Dependence on renal dialysis: Secondary | ICD-10-CM

## 2024-03-24 DIAGNOSIS — Z833 Family history of diabetes mellitus: Secondary | ICD-10-CM

## 2024-03-24 DIAGNOSIS — Z89511 Acquired absence of right leg below knee: Secondary | ICD-10-CM | POA: Diagnosis present

## 2024-03-24 DIAGNOSIS — Z91041 Radiographic dye allergy status: Secondary | ICD-10-CM

## 2024-03-24 DIAGNOSIS — N2581 Secondary hyperparathyroidism of renal origin: Secondary | ICD-10-CM | POA: Diagnosis present

## 2024-03-24 DIAGNOSIS — Z91048 Other nonmedicinal substance allergy status: Secondary | ICD-10-CM

## 2024-03-24 DIAGNOSIS — R110 Nausea: Secondary | ICD-10-CM | POA: Diagnosis not present

## 2024-03-24 DIAGNOSIS — N189 Chronic kidney disease, unspecified: Secondary | ICD-10-CM | POA: Diagnosis present

## 2024-03-24 DIAGNOSIS — E11628 Type 2 diabetes mellitus with other skin complications: Secondary | ICD-10-CM | POA: Diagnosis not present

## 2024-03-24 DIAGNOSIS — E66811 Obesity, class 1: Secondary | ICD-10-CM | POA: Diagnosis present

## 2024-03-24 DIAGNOSIS — Z89512 Acquired absence of left leg below knee: Secondary | ICD-10-CM

## 2024-03-24 DIAGNOSIS — A0472 Enterocolitis due to Clostridium difficile, not specified as recurrent: Secondary | ICD-10-CM | POA: Diagnosis not present

## 2024-03-24 DIAGNOSIS — Z6832 Body mass index (BMI) 32.0-32.9, adult: Secondary | ICD-10-CM

## 2024-03-24 DIAGNOSIS — N186 End stage renal disease: Secondary | ICD-10-CM | POA: Diagnosis present

## 2024-03-24 DIAGNOSIS — Y835 Amputation of limb(s) as the cause of abnormal reaction of the patient, or of later complication, without mention of misadventure at the time of the procedure: Secondary | ICD-10-CM | POA: Diagnosis not present

## 2024-03-24 DIAGNOSIS — L89322 Pressure ulcer of left buttock, stage 2: Secondary | ICD-10-CM | POA: Diagnosis present

## 2024-03-24 DIAGNOSIS — I959 Hypotension, unspecified: Secondary | ICD-10-CM | POA: Diagnosis not present

## 2024-03-24 DIAGNOSIS — E1151 Type 2 diabetes mellitus with diabetic peripheral angiopathy without gangrene: Secondary | ICD-10-CM | POA: Diagnosis present

## 2024-03-24 DIAGNOSIS — K219 Gastro-esophageal reflux disease without esophagitis: Secondary | ICD-10-CM | POA: Diagnosis present

## 2024-03-24 DIAGNOSIS — E1122 Type 2 diabetes mellitus with diabetic chronic kidney disease: Secondary | ICD-10-CM | POA: Diagnosis present

## 2024-03-24 DIAGNOSIS — Z794 Long term (current) use of insulin: Secondary | ICD-10-CM

## 2024-03-24 DIAGNOSIS — I120 Hypertensive chronic kidney disease with stage 5 chronic kidney disease or end stage renal disease: Secondary | ICD-10-CM | POA: Diagnosis present

## 2024-03-24 DIAGNOSIS — I480 Paroxysmal atrial fibrillation: Secondary | ICD-10-CM | POA: Diagnosis present

## 2024-03-24 DIAGNOSIS — L22 Diaper dermatitis: Secondary | ICD-10-CM | POA: Diagnosis present

## 2024-03-24 DIAGNOSIS — E1140 Type 2 diabetes mellitus with diabetic neuropathy, unspecified: Secondary | ICD-10-CM | POA: Diagnosis present

## 2024-03-24 DIAGNOSIS — J449 Chronic obstructive pulmonary disease, unspecified: Secondary | ICD-10-CM | POA: Diagnosis present

## 2024-03-24 DIAGNOSIS — Z7902 Long term (current) use of antithrombotics/antiplatelets: Secondary | ICD-10-CM

## 2024-03-24 DIAGNOSIS — Z83438 Family history of other disorder of lipoprotein metabolism and other lipidemia: Secondary | ICD-10-CM

## 2024-03-24 DIAGNOSIS — R1084 Generalized abdominal pain: Secondary | ICD-10-CM | POA: Diagnosis not present

## 2024-03-24 DIAGNOSIS — Z8673 Personal history of transient ischemic attack (TIA), and cerebral infarction without residual deficits: Secondary | ICD-10-CM

## 2024-03-24 LAB — URINALYSIS, ROUTINE W REFLEX MICROSCOPIC
Bilirubin Urine: NEGATIVE
Glucose, UA: >=500 mg/dL — AB
Hgb urine dipstick: NEGATIVE
Ketones, ur: NEGATIVE mg/dL
Nitrite: NEGATIVE
Protein, ur: >=300 mg/dL — AB
Specific Gravity, Urine: 1.01 (ref 1.005–1.030)
WBC, UA: >50 WBC/hpf (ref 0–5)
pH: 7 (ref 5.0–8.0)

## 2024-03-24 LAB — CBC WITH DIFFERENTIAL/PLATELET
Abs Immature Granulocytes: 0.04 K/uL (ref 0.00–0.07)
Basophils Absolute: 0 K/uL (ref 0.0–0.1)
Basophils Relative: 0 %
Eosinophils Absolute: 0.3 K/uL (ref 0.0–0.5)
Eosinophils Relative: 3 %
HCT: 30.1 % — ABNORMAL LOW (ref 39.0–52.0)
Hemoglobin: 9.5 g/dL — ABNORMAL LOW (ref 13.0–17.0)
Immature Granulocytes: 0 %
Lymphocytes Relative: 7 %
Lymphs Abs: 0.6 K/uL — ABNORMAL LOW (ref 0.7–4.0)
MCH: 29.5 pg (ref 26.0–34.0)
MCHC: 31.6 g/dL (ref 30.0–36.0)
MCV: 93.5 fL (ref 80.0–100.0)
Monocytes Absolute: 0.7 K/uL (ref 0.1–1.0)
Monocytes Relative: 8 %
Neutro Abs: 7.6 K/uL (ref 1.7–7.7)
Neutrophils Relative %: 82 %
Platelets: 147 K/uL — ABNORMAL LOW (ref 150–400)
RBC: 3.22 MIL/uL — ABNORMAL LOW (ref 4.22–5.81)
RDW: 16.2 % — ABNORMAL HIGH (ref 11.5–15.5)
WBC: 9.3 K/uL (ref 4.0–10.5)
nRBC: 0 % (ref 0.0–0.2)

## 2024-03-24 LAB — COMPREHENSIVE METABOLIC PANEL WITH GFR
ALT: 11 U/L (ref 0–44)
AST: 18 U/L (ref 15–41)
Albumin: 2.6 g/dL — ABNORMAL LOW (ref 3.5–5.0)
Alkaline Phosphatase: 95 U/L (ref 38–126)
Anion gap: 13 (ref 5–15)
BUN: 25 mg/dL — ABNORMAL HIGH (ref 8–23)
CO2: 27 mmol/L (ref 22–32)
Calcium: 7.3 mg/dL — ABNORMAL LOW (ref 8.9–10.3)
Chloride: 96 mmol/L — ABNORMAL LOW (ref 98–111)
Creatinine, Ser: 4.93 mg/dL — ABNORMAL HIGH (ref 0.61–1.24)
GFR, Estimated: 12 mL/min — ABNORMAL LOW (ref >60)
Glucose, Bld: 144 mg/dL — ABNORMAL HIGH (ref 70–99)
Potassium: 3.4 mmol/L — ABNORMAL LOW (ref 3.5–5.1)
Sodium: 136 mmol/L (ref 135–145)
Total Bilirubin: 0.9 mg/dL (ref 0.0–1.2)
Total Protein: 5.5 g/dL — ABNORMAL LOW (ref 6.5–8.1)

## 2024-03-24 LAB — CBG MONITORING, ED: Glucose-Capillary: 133 mg/dL — ABNORMAL HIGH (ref 70–99)

## 2024-03-24 MED ORDER — OXYCODONE-ACETAMINOPHEN 5-325 MG PO TABS
1.0000 | ORAL_TABLET | Freq: Once | ORAL | Status: AC
  Administered 2024-03-24: 1 via ORAL
  Filled 2024-03-24: qty 1

## 2024-03-24 NOTE — Discharge Planning (Signed)
 RNCM consulted regarding pt wanting to have home health.  Pt from [facility] and does not want to return there.  RNCM advised that he must work with facility SW to transfer to another nursing home,  Aaran Enberg J. Debarah, BSN, RN, [facility]  IP Care Management  Nurse Case Manager  [facility]  Operative Services  [phone]

## 2024-03-24 NOTE — ED Notes (Signed)
 Pt had a bowel movement in brief Pt cleaned with soap and water  Pt wounds cleaned with vashe Barrier cream placed around wound  EDP brought to pt bedside

## 2024-03-24 NOTE — Discharge Instructions (Addendum)
 You have a pressure injury of the sacrum and I have ordered home health to come out and help treat your wounds . Contact a health care provider if: You have a fever or chills. You have pain that does not get better with medicine. Your skin changes color. You have new blisters or sores. You have signs of infection. Your wound does not get better after 1-2 weeks of treatment. This information is not intended to replace advice given to you by your health care provider. Make sure you discuss any questions you have with your health care provider.

## 2024-03-24 NOTE — ED Triage Notes (Signed)
 Pt has chronic nausea and diarrhea but was worse today. Pt had 3 hours of dialysis today. Bed sore to bottom. C/O abd pain.

## 2024-03-24 NOTE — ED Provider Notes (Signed)
 [place] EMERGENCY DEPARTMENT AT [facility] Provider Note   CSN: [id number] Arrival date & time: 03/24/24  1720     Patient presents with: Nausea   KORON GODEAUX is a 68 y.o. male with past medical history of end-stage renal disease, peripheral arterial disease status post bilateral AKA who presents for evaluation of his sacral room.  He complains of pain in his bottom.  Patient's triage note states that he has multiple complaints however patient states that he is only here because of his bottom.  He had a full dialysis session today.  He has no other complaints at this time.  {Add pertinent medical, surgical, social history, OB history to HPI:32947} HPI     Prior to Admission medications   Medication Sig Start Date End Date Taking? Authorizing Provider  amLODipine  (NORVASC ) 10 MG tablet Take 5 mg by mouth daily. Give 0.5 tablet by mouth one time a day    [provider]  apixaban  (ELIQUIS ) 5 MG TABS tablet Take 1 tablet (5 mg total) by mouth 2 (two) times daily. 01/14/22 09/09/24  Gonfa, Taye T, MD  atorvastatin  (LIPITOR ) 80 MG tablet Take 1 tablet (80 mg total) by mouth at bedtime. 11/25/23   Amoako, Prince, MD  B Complex-C-Zn-Folic Acid  (DIALYVITE /ZINC ) TABS Take 1 tablet by mouth Every Tuesday,Thursday,and Saturday with dialysis. 10/04/21   [provider]  clopidogrel  (PLAVIX ) 75 MG tablet Take 1 tablet (75 mg total) by mouth daily with breakfast. Patient taking differently: Take 75 mg by mouth daily. 11/25/23 11/24/24  Amoako, Prince, MD  colchicine  0.6 MG tablet Take 0.5 tablets (0.3 mg total) by mouth daily. 02/21/24   Gonfa, Taye T, MD  famotidine  (PEPCID ) 20 MG tablet Take 20 mg by mouth every other day.    [provider]  fentaNYL  (DURAGESIC ) 50 MCG/HR Place 1 patch onto the skin every 3 (three) days. 07/25/23   [provider]  folic acid  (FOLVITE ) 1 MG tablet Take 1 mg by mouth daily. 09/18/21   [provider]  gabapentin   (NEURONTIN ) 100 MG capsule Take 100 mg by mouth 3 (three) times daily.    [provider]  insulin  aspart (NOVOLOG  FLEXPEN) 100 UNIT/ML FlexPen Inject 4 Units into the skin 3 (three) times daily with meals. Inject 4 units into the skin with meals    [provider]  insulin  aspart (NOVOLOG  FLEXPEN) 100 UNIT/ML FlexPen Inject 0-12 Units into the skin 3 (three) times daily with meals. Inject 4 units into the skin with meals in addition to sliding scale as needed. Inject as per sliding scale:   If 0 - 69 =    0 initiate hypoglycemia orders;  70 - 150 =   0;  151 - 200 = 2;  201 - 250 = 4;  251 - 300 = 6;  301 - 350 = 8;  351 - 400 = 10;  401 - 450 = 12.  If >400, give 12 units, recheck in 1 hour. If still >400, call MD/NP for further orders.    [provider]  isosorbide  mononitrate (IMDUR ) 60 MG 24 hr tablet Take 60 mg by mouth daily. Give 60mg  by mouth one time a day    [provider]  lidocaine -prilocaine  (EMLA ) cream Apply 1 application topically See admin instructions. Prior to Dialysis days Tuesday,Thursday and saturday 06/06/21   [provider]  loratadine  (CLARITIN ) 10 MG tablet Take 10 mg by mouth daily.    [provider]  mineral oil-hydrophilic petrolatum (AQUAPHOR) ointment Apply 1 Application topically as needed for dry skin or irritation (Upper thigh).    [provider]  nitroGLYCERIN  (NITROSTAT ) 0.4 MG SL tablet Place 0.4 mg under the tongue every 5 (five) minutes as needed for chest pain. 01/06/22   [provider]  oxyCODONE -acetaminophen  (PERCOCET /ROXICET) 5-325 MG tablet Take 1 tablet by mouth every 8 (eight) hours as needed for severe pain. 05/31/23   Ghimire, Donalda HERO, MD  promethazine  (PHENERGAN ) 12.5 MG suppository Place 12.5 mg rectally daily as needed for nausea or vomiting.    [provider]  rOPINIRole  (REQUIP ) 2 MG tablet Take 2 mg by mouth at bedtime.    [provider]   sevelamer  carbonate (RENVELA ) 800 MG tablet Take 2 tablets (1,600 mg total) by mouth 3 (three) times daily with meals. 02/04/24   Amin, Sumayya, MD  tamsulosin  (FLOMAX ) 0.4 MG CAPS capsule Take 0.4 mg by mouth daily.    [provider]    Allergies: Contrast media [iodinated contrast media], Ivp dye [iodinated contrast media], Latex, and Tape    Review of Systems  Updated Vital Signs BP (!) 118/102 (BP Location: Left Arm)   Pulse 96   Temp 99.6 F (37.6 C)   Resp 20   Ht 6' 2 (1.88 m)   Wt 113.4 kg   SpO2 92%   BMI 32.10 kg/m   Physical Exam Vitals and nursing note reviewed.  Constitutional:      General: He is not in acute distress.    Appearance: He is well-developed. He is not diaphoretic.  HENT:     Head: Normocephalic and atraumatic.  Eyes:     General: No scleral icterus.    Conjunctiva/sclera: Conjunctivae normal.  Cardiovascular:     Rate and Rhythm: Normal rate and regular rhythm.     Heart sounds: Normal heart sounds.  Pulmonary:     Effort: Pulmonary effort is normal. No respiratory distress.     Breath sounds: Normal breath sounds.  Abdominal:     Palpations: Abdomen is soft.     Tenderness: There is no abdominal tenderness.  Musculoskeletal:     Cervical back: Normal range of motion and neck supple.     Comments: Stage I and II decubitus pressure ulcer over the sacral region, no evidence of secondary infection.  Tender to palpation.  Skin:    General: Skin is warm and dry.  Neurological:     Mental Status: He is alert.  Psychiatric:        Behavior: Behavior normal.     (all labs ordered are listed, but only abnormal results are displayed) Labs Reviewed  CBC WITH DIFFERENTIAL/PLATELET - Abnormal; Notable for the following components:      Result Value   RBC 3.22 (*)    Hemoglobin 9.5 (*)    HCT 30.1 (*)    RDW 16.2 (*)    Platelets 147 (*)    Lymphs Abs 0.6 (*)    All other components within normal limits  CBG MONITORING, ED -  Abnormal; Notable for the following components:   Glucose-Capillary 133 (*)    All other components within normal limits  COMPREHENSIVE METABOLIC PANEL WITH GFR    EKG: None  Radiology: No results found.  {Document cardiac monitor, telemetry assessment procedure when appropriate:32947} Procedures   Medications Ordered in the ED - No data to display  Clinical Course as of 03/24/24 2157  Tue Mar 24, 2024  2121 Case discussed by phone  with the patient's wife. He had a recent DX of C-Diff -treated at his snf, but has developed sacral ulcer.  Family is very concerned about his care at that facility.  We had a long discussion at bedside about options that include taking the patient home tonight with them and also include seeking transfer of the patient through either PCP or social work at that facility.  Patient's wife understands and states that mostly she wants him reevaluated for his c-diff and for possible UTI because he had shaking chill earlier  [AH]    Clinical Course User Index [AH] Arloa Chroman, PA-C   {Click here for ABCD2, HEART and other calculators REFRESH Note before signing:1}                              Medical Decision Making Amount and/or Complexity of Data Reviewed Labs: ordered. Decision-making details documented in ED Course.  Risk Prescription drug management.   ***  {Document critical care time when appropriate  Document review of labs and clinical decision tools ie CHADS2VASC2, etc  Document your independent review of radiology images and any outside records  Document your discussion with family members, caretakers and with consultants  Document social determinants of health affecting pt's care  Document your decision making why or why not admission, treatments were needed:32947:::1}   Final diagnoses:  None    ED Discharge Orders     None

## 2024-03-25 ENCOUNTER — Encounter (HOSPITAL_COMMUNITY): Payer: Self-pay

## 2024-03-25 ENCOUNTER — Telehealth (HOSPITAL_COMMUNITY): Payer: Self-pay | Admitting: Pharmacy Technician

## 2024-03-25 ENCOUNTER — Other Ambulatory Visit (HOSPITAL_COMMUNITY): Payer: Self-pay

## 2024-03-25 ENCOUNTER — Encounter (HOSPITAL_COMMUNITY): Payer: Self-pay | Admitting: Internal Medicine

## 2024-03-25 DIAGNOSIS — L89152 Pressure ulcer of sacral region, stage 2: Secondary | ICD-10-CM | POA: Diagnosis present

## 2024-03-25 DIAGNOSIS — L89159 Pressure ulcer of sacral region, unspecified stage: Secondary | ICD-10-CM | POA: Diagnosis not present

## 2024-03-25 DIAGNOSIS — D631 Anemia in chronic kidney disease: Secondary | ICD-10-CM | POA: Diagnosis not present

## 2024-03-25 DIAGNOSIS — N186 End stage renal disease: Secondary | ICD-10-CM | POA: Diagnosis not present

## 2024-03-25 DIAGNOSIS — Z992 Dependence on renal dialysis: Secondary | ICD-10-CM | POA: Diagnosis not present

## 2024-03-25 DIAGNOSIS — N3000 Acute cystitis without hematuria: Secondary | ICD-10-CM

## 2024-03-25 DIAGNOSIS — A0471 Enterocolitis due to Clostridium difficile, recurrent: Secondary | ICD-10-CM | POA: Diagnosis not present

## 2024-03-25 DIAGNOSIS — E66811 Obesity, class 1: Secondary | ICD-10-CM

## 2024-03-25 LAB — RENAL FUNCTION PANEL
Albumin: 2.7 g/dL — ABNORMAL LOW (ref 3.5–5.0)
Anion gap: 16 — ABNORMAL HIGH (ref 5–15)
BUN: 32 mg/dL — ABNORMAL HIGH (ref 8–23)
CO2: 24 mmol/L (ref 22–32)
Calcium: 8 mg/dL — ABNORMAL LOW (ref 8.9–10.3)
Chloride: 97 mmol/L — ABNORMAL LOW (ref 98–111)
Creatinine, Ser: 5.75 mg/dL — ABNORMAL HIGH (ref 0.61–1.24)
GFR, Estimated: 10 mL/min — ABNORMAL LOW (ref >60)
Glucose, Bld: 127 mg/dL — ABNORMAL HIGH (ref 70–99)
Phosphorus: 5.3 mg/dL — ABNORMAL HIGH (ref 2.5–4.6)
Potassium: 3.7 mmol/L (ref 3.5–5.1)
Sodium: 137 mmol/L (ref 135–145)

## 2024-03-25 LAB — C DIFFICILE QUICK SCREEN W PCR REFLEX
C Diff antigen: POSITIVE — AB
C Diff interpretation: DETECTED
C Diff toxin: POSITIVE — AB

## 2024-03-25 LAB — HEPATITIS B SURFACE ANTIGEN: Hepatitis B Surface Ag: NONREACTIVE

## 2024-03-25 MED ORDER — PROCHLORPERAZINE EDISYLATE 10 MG/2ML IJ SOLN: 5.0000 mg | Freq: Four times a day (QID) | INTRAMUSCULAR | Status: DC | PRN

## 2024-03-25 MED ORDER — CLOPIDOGREL BISULFATE 75 MG PO TABS
75.0000 mg | ORAL_TABLET | Freq: Every day | ORAL | Status: DC
  Administered 2024-03-25 – 2024-03-28 (×4): 75 mg via ORAL
  Filled 2024-03-25 (×4): qty 1

## 2024-03-25 MED ORDER — GERHARDT'S BUTT CREAM
TOPICAL_CREAM | Freq: Two times a day (BID) | CUTANEOUS | Status: DC
  Filled 2024-03-25: qty 60

## 2024-03-25 MED ORDER — CALCIUM ACETATE (PHOS BINDER) 667 MG PO CAPS
1334.0000 mg | ORAL_CAPSULE | Freq: Three times a day (TID) | ORAL | Status: DC
  Administered 2024-03-26 (×2): 1334 mg via ORAL
  Filled 2024-03-25 (×2): qty 2

## 2024-03-25 MED ORDER — ACETAMINOPHEN 325 MG PO TABS: 650.0000 mg | ORAL_TABLET | Freq: Four times a day (QID) | ORAL | Status: DC | PRN

## 2024-03-25 MED ORDER — FIDAXOMICIN 200 MG PO TABS
200.0000 mg | ORAL_TABLET | Freq: Two times a day (BID) | ORAL | Status: DC
  Administered 2024-03-25 – 2024-03-26 (×4): 200 mg via ORAL
  Filled 2024-03-25 (×6): qty 1

## 2024-03-25 MED ORDER — SEVELAMER CARBONATE 800 MG PO TABS
1600.0000 mg | ORAL_TABLET | Freq: Three times a day (TID) | ORAL | Status: DC
  Administered 2024-03-25 (×2): 1600 mg via ORAL
  Filled 2024-03-25 (×3): qty 2

## 2024-03-25 MED ORDER — SODIUM CHLORIDE 0.9 % IV SOLN
1.0000 g | Freq: Every day | INTRAVENOUS | Status: DC
  Administered 2024-03-25 – 2024-03-26 (×2): 1 g via INTRAVENOUS
  Filled 2024-03-25 (×2): qty 10

## 2024-03-25 MED ORDER — ATORVASTATIN CALCIUM 80 MG PO TABS
80.0000 mg | ORAL_TABLET | Freq: Every day | ORAL | Status: DC
  Administered 2024-03-25 – 2024-03-27 (×3): 80 mg via ORAL
  Filled 2024-03-25 (×3): qty 1

## 2024-03-25 MED ORDER — APIXABAN 5 MG PO TABS
5.0000 mg | ORAL_TABLET | Freq: Two times a day (BID) | ORAL | Status: DC
  Administered 2024-03-25 – 2024-03-28 (×7): 5 mg via ORAL
  Filled 2024-03-25 (×7): qty 1

## 2024-03-25 MED ORDER — POLYETHYLENE GLYCOL 3350 17 G PO PACK: 17.0000 g | PACK | Freq: Every day | ORAL | Status: DC | PRN

## 2024-03-25 NOTE — Assessment & Plan Note (Addendum)
 03-25-2024 chronic. Pt lives at [facility] He plans on returning there.  03-26-2024 stable.   03-27-2024 stable  03-28-2024 stable. chronic

## 2024-03-25 NOTE — H&P (Addendum)
 History and Physical  [name] FMW:[id number] DOB: 1956/02/09 DOA: 03/24/2024  Referring physician: Dr. Nettie, EDP  PCP: Montey Lot, PA-C  Outpatient Specialists: Nephrology Patient coming from: Home  Chief Complaint: Nausea, diarrhea, and sacral wound  HPI: [name] is a 68 y.o. male with medical history significant for ESRD on HD TTS, C-diff infection, sacral decubitus ulcer, type 2 diabetes, hyperlipidemia, hypertension, COPD, peripheral artery disease status post bilateral below the knee amputation, paroxysmal A-fib on Eliquis , coronary artery disease status post PCI with drug-eluting stent placed on 11/24/2023 on Plavix , who presents to the ER due to persistent nausea for the past few days, watery stool for weeks, and a sacral decubitus wound.  In the ER, UA positive for pyuria.  C. difficile positive.  Started on Rocephin  and Fidaxomicin .  Wound care specialist consulted for the sacral decubitus ulcer.  TRH, hospitalist service, was asked to admit.  ED Course: Temperature 99.0.  BP 109/50, pulse 72, respiratory 18, saturation 97% on room air.  Lab studies notable for WBC 9.3, platelet count 147, hemoglobin 9.5.  Review of Systems: Review of systems as noted in the HPI. All other systems reviewed and are negative.   Past Medical History:  Diagnosis Date   Carotid artery occlusion 11/10/2010   LEFT CAROTID ENDARTERECTOMY   Complication of anesthesia    BP WENT UP AT [facility]    COPD (chronic obstructive pulmonary disease) (HCC)    pt denies this dx as of 06/01/20 - no inhaler    Diabetes mellitus without complication (HCC)    Diverticulitis    Diverticulosis of colon (without mention of hemorrhage)    DJD (degenerative joint disease)    knees/hands/feet/back/neck   ESRD (end stage renal disease) on dialysis (HCC)    Fatty liver    Full dentures    GERD (gastroesophageal reflux disease)    H/O hiatal hernia    History of blood transfusion    with a past surical  procedure per patient 06/01/20   Hyperlipidemia    Hypertension    Neuromuscular disorder (HCC)    peripheral neuropathy   Non-pressure chronic ulcer of other part of left foot limited to breakdown of skin (HCC) 11/12/2016   Nonrheumatic aortic (valve) stenosis 03/18/2024   Limited TTE 03/18/2024: EF 55-60, moderate LVH, normal RVSF, moderate LAE, mild MR, mild-moderate AS (mean 12, V-max 235 cm/s, DI 0.35), aortic root 39 mm, moderate pleural effusion, trivial pericardial effusion    Osteomyelitis (HCC)    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 Atherectomy left popliteal artery July 2012.    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   Unstable angina (HCC) 09/16/2018   Wears glasses    Past Surgical History:  Procedure Laterality Date   ABDOMINAL AORTOGRAM W/LOWER EXTREMITY N/A 06/23/2021   Procedure: ABDOMINAL AORTOGRAM W/LOWER EXTREMITY;  Surgeon: Elmira Newman PARAS, MD;  [facility]: [facility];  Service: Cardiovascular;  Laterality: N/A;   AMPUTATION  11/05/2011   Procedure: AMPUTATION RAY;  Surgeon: Norleen Armor, MD;  Location: [facility] OR;  Service: Orthopedics;  Laterality: Right;  Amputation of Right 4&5th Toes   AMPUTATION Left 11/26/2012   Procedure: AMPUTATION RAY;  Surgeon: Norleen Armor, MD;  Location: [facility] OR;  Service: Orthopedics;  Laterality: Left;  fourth  ray amputation   AMPUTATION Right 08/27/2014   Procedure: Transmetatarsal Amputation;  Surgeon: Jerona Harden GAILS, MD;  Location: [facility] OR;  Service: Orthopedics;  Laterality: Right;   AMPUTATION Right 01/14/2015   Procedure: AMPUTATION BELOW KNEE;  Surgeon: Jerona Harden GAILS, MD;  Location: [facility] OR;  Service: Orthopedics;  Laterality: Right;   AMPUTATION Left 10/21/2015   Procedure: Left Foot 5th Ray Amputation;  Surgeon: Jerona Harden GAILS, MD;  Location: [facility] OR;   Service: Orthopedics;  Laterality: Left;   AMPUTATION Left 07/04/2022   Procedure: LEFT BELOW KNEE AMPUTATION;  Surgeon: Harden Jerona GAILS, MD;  Location: [facility] OR;  Service: Orthopedics;  Laterality: Left;   ANTERIOR FUSION CERVICAL SPINE  02/06/06   C4-5, C5-6, C6-7; SURGEON DR. MAX COHEN   AV FISTULA PLACEMENT Left 06/02/2020   Procedure: ARTERIOVENOUS (AV) FISTULA CREATION LEFT;  Surgeon: Sheree Penne Bruckner, MD;  Location: [facility] OR;  Service: Vascular;  Laterality: Left;   BACK SURGERY     x 3   BASCILIC VEIN TRANSPOSITION Left 07/21/2020   Procedure: LEFT UPPER ARM ATERIOVENOUS SUPERFISTULALIZATION;  Surgeon: Sheree Penne Bruckner, MD;  Location: [facility] OR;  Service: Vascular;  Laterality: Left;   BELOW KNEE LEG AMPUTATION Right    CARDIAC CATHETERIZATION  10/31/04   2009   CAROTID ENDARTERECTOMY  11/10/10   CAROTID ENDARTERECTOMY Left 11/10/2010   Subtotal occlusion of left internal carotid artery with left hemispheric transient ischemic attacks.   CAROTID STENT     CARPAL TUNNEL RELEASE Right 10/21/2013   Procedure: RIGHT CARPAL TUNNEL RELEASE;  Surgeon: Arley JONELLE Curia, MD;  Location: [facility];  Service: Orthopedics;  Laterality: Right;   CHOLECYSTECTOMY     COLON SURGERY     COLONOSCOPY     COLOSTOMY REVERSAL  05/21/2018   ileostomy reversal   CORONARY LITHOTRIPSY N/A 11/24/2023   Procedure: CORONARY LITHOTRIPSY;  Surgeon: Swaziland, Peter M, MD;  Location: [facility] INVASIVE CV LAB;  Service: Cardiovascular;  Laterality: N/A;   CORONARY STENT INTERVENTION N/A 11/24/2023   Procedure: CORONARY STENT INTERVENTION;  Surgeon: [place], Peter M, MD;  Location: [facility] INVASIVE CV LAB;  Service: Cardiovascular;  Laterality: N/A;   CYSTOSCOPY WITH STENT PLACEMENT Bilateral 01/13/2018   Procedure: CYSTOSCOPY WITH BILATERAL URETERAL CATHETER PLACEMENT;  Surgeon: Cam Morene ORN, MD;  Location: [facility] ORS;  Service: Urology;  Laterality: Bilateral;   ESOPHAGEAL MANOMETRY Bilateral 07/19/2014    Procedure: ESOPHAGEAL MANOMETRY (EM);  Surgeon: Gordy CHRISTELLA Starch, MD;  Location: [facility];  Service: Gastroenterology;  Laterality: Bilateral;   EYE SURGERY Bilateral 2020   cataract   FEMORAL ARTERY STENT     x6   FINGER SURGERY     FOOT SURGERY  04/25/2016    EXCISION BASE 5TH METATARSAL AND PARTIAL CUBOID LEFT FOOT   HERNIA REPAIR     LEFT INGUINAL AND UMBILICAL REPAIRS   HERNIA REPAIR     I & D EXTREMITY Left 04/25/2016   Procedure: EXCISION BASE 5TH METATARSAL AND PARTIAL CUBOID LEFT FOOT;  Surgeon: Jerona GAILS Harden, MD;  Location: [facility];  Service: Orthopedics;  Laterality: Left;   ILEOSTOMY  01/13/2018   Procedure: ILEOSTOMY;  Surgeon: Signe Mitzie LABOR, MD;  Location: [facility] ORS;  Service: General;;   ILEOSTOMY CLOSURE N/A 05/21/2018   Procedure: ILEOSTOMY REVERSAL ERAS PATHWAY;  Surgeon: Signe Mitzie LABOR, MD;  Location: [facility] OR;  Service: General;  Laterality: N/A;   IR RADIOLOGIST EVAL & MGMT  11/19/2017   IR RADIOLOGIST EVAL & MGMT  12/03/2017  IR RADIOLOGIST EVAL & MGMT  12/18/2017   JOINT REPLACEMENT Right 2001   Total knee   LAMINECTOMY     X 3 LUMBAR AND X 2 CERVICAL SPINE OPERATIONS   LAPAROSCOPIC CHOLECYSTECTOMY W/ CHOLANGIOGRAPHY  11/09/04   SURGEON DR. DEWARD CANDIE NULL   LEFT HEART CATH AND CORONARY ANGIOGRAPHY N/A 09/16/2018   Procedure: LEFT HEART CATH AND CORONARY ANGIOGRAPHY;  Surgeon: Elmira Newman PARAS, MD;  [facility];  Service: Cardiovascular;  Laterality: N/A;   LEFT HEART CATH AND CORONARY ANGIOGRAPHY N/A 11/24/2023   Procedure: LEFT HEART CATH AND CORONARY ANGIOGRAPHY;  Surgeon: [place], Peter M, MD;  Location: [facility] INVASIVE CV LAB;  Service: Cardiovascular;  Laterality: N/A;   LEFT HEART CATHETERIZATION WITH CORONARY ANGIOGRAM N/A 10/29/2014   Procedure: LEFT HEART CATHETERIZATION WITH CORONARY ANGIOGRAM;  Surgeon: Erick JONELLE Bergamo, MD;  Location: [facility] CATH LAB;  Service: Cardiovascular;  Laterality: N/A;   LIGATION OF COMPETING BRANCHES OF ARTERIOVENOUS FISTULA  Left 07/21/2020   Procedure: LIGATION OF COMPETING BRANCHES OF LEFT UPPER ARM ARTERIOVENOUS FISTULA;  Surgeon: Sheree Penne Bruckner, MD;  Location: [facility] OR;  Service: Vascular;  Laterality: Left;   LOWER EXTREMITY ANGIOGRAM N/A 03/19/2012   Procedure: LOWER EXTREMITY ANGIOGRAM;  Surgeon: Bruckner JONETTA Cash, MD;  Location: [facility] CATH LAB;  Service: Cardiovascular;  Laterality: N/A;   LOWER EXTREMITY ANGIOGRAPHY N/A 06/20/2021   Procedure: LOWER EXTREMITY ANGIOGRAPHY;  Surgeon: Elmira Newman PARAS, MD;  [facility];  Service: Cardiovascular;  Laterality: N/A;   NECK SURGERY     PARTIAL COLECTOMY N/A 01/13/2018   Procedure: LAPAROSCOPIC ASSISTED   SIGMOID COLECTOMY ILEOSTOMY;  Surgeon: Signe Mitzie LABOR, MD;  Location: [facility] ORS;  Service: General;  Laterality: N/A;   PENILE PROSTHESIS IMPLANT  08/14/05   INFRAPUBIC INSERTION OF INFLATABLE PENILE PROSTHESIS; SURGEON DR. JANIT   PENILE PROSTHESIS IMPLANT     PERCUTANEOUS CORONARY STENT INTERVENTION (PCI-S) Right 10/29/2014   Procedure: PERCUTANEOUS CORONARY STENT INTERVENTION (PCI-S);  Surgeon: Erick JONELLE Bergamo, MD;  Location: [facility] CATH LAB;  Service: Cardiovascular;  Laterality: Right;   PERICARDIOCENTESIS N/A 02/13/2024   Procedure: PERICARDIOCENTESIS;  Surgeon: Elmira Newman PARAS, MD;  [facility];  Service: Cardiovascular;  Laterality: N/A;   PERIPHERAL VASCULAR INTERVENTION Left 06/23/2021   Procedure: PERIPHERAL VASCULAR INTERVENTION;  Surgeon: Elmira Newman PARAS, MD;  [facility];  Service: Cardiovascular;  Laterality: Left;   REMOVAL OF PENILE PROSTHESIS N/A 06/14/2021   Procedure: Removal of THREE piece inflatable penile prosthesis;  Surgeon: Carolee Sherwood JONETTA DOUGLAS, MD;  Location: [facility] OR;  Service: Urology;  Laterality: N/A;   SHOULDER ARTHROSCOPY     SPINE SURGERY     TEMPORARY PACEMAKER N/A 02/13/2024   Procedure: TEMPORARY PACEMAKER;  Surgeon: Elmira Newman PARAS, MD;  [facility]  [facility];  Service: Cardiovascular;  Laterality: N/A;   TOE AMPUTATION Left    TONSILLECTOMY     TOTAL KNEE ARTHROPLASTY  07/2002   RIGHT KNEE ; SURGEON  DR. [name] ALSO HAD ARTHROSCOPIC RIGHT KNEE IN  10/2001   TOTAL KNEE ARTHROPLASTY     ULNAR NERVE TRANSPOSITION Right 10/21/2013   Procedure: RIGHT ELBOW  ULNAR NERVE DECOMPRESSION;  Surgeon: Arley JONELLE Curia, MD;  Location: [facility];  Service: Orthopedics;  Laterality: Right;    Social History:  reports that he has been smoking cigarettes. He has a 35 pack-year smoking history. He has never used smokeless tobacco. He reports that he does not  currently use alcohol . He reports that he does not use drugs.   Allergies  Allergen Reactions   Contrast Media [Iodinated Contrast Media] Shortness Of Breath and Other (See Comments)    Difficulty breathing and altered mental status     Ivp Dye [Iodinated Contrast Media] Anaphylaxis, Shortness Of Breath and Other (See Comments)    Breathing problems, altered mental state    Latex Rash and Other (See Comments)    A severe rash appears after the first 24 hours of being placed   Tape Other (See Comments) and Rash    Rash after 1 day of use  Do not leave on longer then 3 days with out be changed    Family History  Problem Relation Age of Onset   Heart disease Father        Before age 70-  CAD, BPG   Diabetes Father        Amputation   Cancer Father        PROSTATE   Hyperlipidemia Father    Hypertension Father    Heart attack Father        Triple BPG   Varicose Veins Father    Cancer Sister        Breast   Hyperlipidemia Sister    Hypertension Sister    Heart attack Brother    Colon cancer Brother    Diabetes Brother    Heart disease Brother 24       A-Fib. Before age 31   Hyperlipidemia Brother    Hypertension Brother    Hypertension Son    Arthritis Other        GRANDMOTHER   Hypertension Other        OTHER FAMILY MEMBERS      Prior to Admission medications    Medication Sig Start Date End Date Taking? Authorizing Provider  amLODipine  (NORVASC ) 10 MG tablet Take 5 mg by mouth daily. Give 0.5 tablet by mouth one time a day    [provider]  apixaban  (ELIQUIS ) 5 MG TABS tablet Take 1 tablet (5 mg total) by mouth 2 (two) times daily. 01/14/22 09/09/24  Gonfa, Taye T, MD  atorvastatin  (LIPITOR ) 80 MG tablet Take 1 tablet (80 mg total) by mouth at bedtime. 11/25/23   Amoako, Prince, MD  B Complex-C-Zn-Folic Acid  (DIALYVITE /ZINC ) TABS Take 1 tablet by mouth Every Tuesday,Thursday,and Saturday with dialysis. 10/04/21   [provider]  clopidogrel  (PLAVIX ) 75 MG tablet Take 1 tablet (75 mg total) by mouth daily with breakfast. Patient taking differently: Take 75 mg by mouth daily. 11/25/23 11/24/24  Amoako, Prince, MD  colchicine  0.6 MG tablet Take 0.5 tablets (0.3 mg total) by mouth daily. 02/21/24   Gonfa, Taye T, MD  famotidine  (PEPCID ) 20 MG tablet Take 20 mg by mouth every other day.    [provider]  fentaNYL  (DURAGESIC ) 50 MCG/HR Place 1 patch onto the skin every 3 (three) days. 07/25/23   [provider]  folic acid  (FOLVITE ) 1 MG tablet Take 1 mg by mouth daily. 09/18/21   [provider]  gabapentin  (NEURONTIN ) 100 MG capsule Take 100 mg by mouth 3 (three) times daily.    [provider]  insulin  aspart (NOVOLOG  FLEXPEN) 100 UNIT/ML FlexPen Inject 4 Units into the skin 3 (three) times daily with meals. Inject 4 units into the skin with meals    [provider]  insulin  aspart (NOVOLOG  FLEXPEN) 100 UNIT/ML FlexPen Inject 0-12 Units into the skin 3 (three)  times daily with meals. Inject 4 units into the skin with meals in addition to sliding scale as needed. Inject as per sliding scale:   If 0 - 69 =    0 initiate hypoglycemia orders;  70 - 150 =   0;  151 - 200 = 2;  201 - 250 = 4;  251 - 300 = 6;  301 - 350 = 8;  351 - 400 = 10;  401 - 450 = 12.  If >400, give 12 units, recheck in 1  hour. If still >400, call MD/NP for further orders.    [provider]  isosorbide  mononitrate (IMDUR ) 60 MG 24 hr tablet Take 60 mg by mouth daily. Give 60mg  by mouth one time a day    [provider]  lidocaine -prilocaine  (EMLA ) cream Apply 1 application topically See admin instructions. Prior to Dialysis days Tuesday,Thursday and saturday 06/06/21   [provider]  loratadine  (CLARITIN ) 10 MG tablet Take 10 mg by mouth daily.    [provider]  mineral oil-hydrophilic petrolatum (AQUAPHOR) ointment Apply 1 Application topically as needed for dry skin or irritation (Upper thigh).    [provider]  nitroGLYCERIN  (NITROSTAT ) 0.4 MG SL tablet Place 0.4 mg under the tongue every 5 (five) minutes as needed for chest pain. 01/06/22   [provider]  oxyCODONE -acetaminophen  (PERCOCET /ROXICET) 5-325 MG tablet Take 1 tablet by mouth every 8 (eight) hours as needed for severe pain. 05/31/23   Ghimire, Donalda HERO, MD  promethazine  (PHENERGAN ) 12.5 MG suppository Place 12.5 mg rectally daily as needed for nausea or vomiting.    [provider]  rOPINIRole  (REQUIP ) 2 MG tablet Take 2 mg by mouth at bedtime.    [provider]  sevelamer  carbonate (RENVELA ) 800 MG tablet Take 2 tablets (1,600 mg total) by mouth 3 (three) times daily with meals. 02/04/24   Caleen Qualia, MD  tamsulosin  (FLOMAX ) 0.4 MG CAPS capsule Take 0.4 mg by mouth daily.    [provider]    Physical Exam: BP (!) 174/73   Pulse 80   Temp 99 F (37.2 C) (Oral)   Resp 20   Ht 6' 2 (1.88 m)   Wt 113.4 kg   SpO2 100%   BMI 32.10 kg/m   General: 68 y.o. year-old male well developed well nourished in no acute distress.  Alert and oriented x3. Cardiovascular: Regular rate and rhythm with no rubs or gallops.  No thyromegaly or JVD noted.  No lower extremity edema. 2/4 pulses in all 4 extremities. Respiratory: Clear to auscultation with no wheezes or rales.  Good inspiratory effort. Abdomen: Soft nontender nondistended with normal bowel sounds x4 quadrants. Muskuloskeletal: No cyanosis, clubbing or edema noted bilaterally Neuro: CN II-XII intact, strength, sensation, reflexes Skin: No ulcerative lesions noted or rashes Psychiatry: Judgement and insight appear normal. Mood is appropriate for condition and setting          Labs on Admission:  Basic Metabolic Panel: Recent Labs  Lab 03/19/24 0022 03/24/24 1807  NA 140 136  K 5.7* 3.4*  CL 103 96*  CO2 22 27  GLUCOSE 82 144*  BUN 54* 25*  CREATININE 7.33* 4.93*  CALCIUM  8.3* 7.3*   Liver Function Tests: Recent Labs  Lab 03/24/24 1807  AST 18  ALT 11  ALKPHOS 95  BILITOT 0.9  PROT 5.5*  ALBUMIN  2.6*   No results for input(s): LIPASE, AMYLASE in the last 168 hours. No results for input(s): AMMONIA in  the last 168 hours. CBC: Recent Labs  Lab 03/19/24 0022 03/24/24 1807  WBC 7.1 9.3  NEUTROABS 5.6 7.6  HGB 9.2* 9.5*  HCT 30.4* 30.1*  MCV 99.0 93.5  PLT 138* 147*   Cardiac Enzymes: No results for input(s): CKTOTAL, CKMB, CKMBINDEX, TROPONINI in the last 168 hours.  BNP (last 3 results) Recent Labs    02/12/24 1638  BNP 387.9*    ProBNP (last 3 results) No results for input(s): PROBNP in the last 8760 hours.  CBG: Recent Labs  Lab 03/24/24 1832  GLUCAP 133*    Radiological Exams on Admission: No results found.  EKG: I independently viewed the EKG done and my findings are as followed: None available at the time of this visit.  Assessment/Plan Present on Admission:  Sacral decubitus ulcer  Principal Problem:   Sacral decubitus ulcer  Sacral decubitus ulcer, POA Wound care specialist consulted Continue local wound care As needed analgesics  Presumptive UTI, POA Rocephin  Follow urine culture Continue with Dificid   Recurrent C. difficile infection Continue Dificid  twice daily  ESRD on HD TTS Electrolytes and volume managed  with hemodialysis Appreciate nephrology's assistance  Coronary disease status post PCI with stenting Resume home regimen  Paroxysmal A-fib on Eliquis  Resume home regimen.  Anemia of chronic disease Hemoglobin 9.5 with MCV of 93, at baseline Continue to monitor H&H   Time: 75 minutes.   DVT prophylaxis: Home Eliquis .  Code Status: Full code.  Family Communication: None at bedside.  Disposition Plan: Admitted to telemetry surgical unit.  Consults called: Wound care specialist.  Admission status: Observation   Status is: Observation    Terry LOISE Hurst MD [facility] Pager [phone]  If 7PM-7AM, please contact night-coverage [email or web address] Password [email or web address]  03/25/2024, 5:55 AM

## 2024-03-25 NOTE — ED Notes (Signed)
 Pt wife updated on pt status per family request

## 2024-03-25 NOTE — Care Management Obs Status (Cosign Needed)
 MEDICARE OBSERVATION STATUS NOTIFICATION   Patient Details  Name: FRANCIS DOENGES MRN: [id number] Date of Birth: August 15, 1956   Medicare Observation Status Notification Given:  Yes    Rosaline JONELLE Joe, RN 03/25/2024, 2:29 PM

## 2024-03-25 NOTE — Subjective & Objective (Addendum)
 Pt seen and examined. Stable. Only 1 stool recorded yesterday. Type 6(mushy). Pt and wife appealed discharge stating that SNF doesn't have his meds. No evidence that this is true. Pt's DC summary and orders completed by 10:30 AM yesterday. This would give SNF pharmacy plenty of time to get meds delivered.  According to nephrology notes from this AM., pt had [employer] pizza delivered to his room yesterday evening. That is definitely not on a renal/diabetic diet. Pt's subsequent BP have been elevated. Likely due to the large sodium load from the [employer] pizza.  There is no medical reason for pt to remain in the hospital. He has reach maximum hospital benefit. Today is a hemodialysis day for him.    Pt states he is agreeable to going back to SNF today.

## 2024-03-25 NOTE — ED Notes (Signed)
 CCMD called.

## 2024-03-25 NOTE — Assessment & Plan Note (Addendum)
 03-25-2024 stable. Holding pepcid  and PPI due to recurrent C. Diff.  03-26-2024 stable.   03-27-2024 stable  03-28-2024 stable.

## 2024-03-25 NOTE — Assessment & Plan Note (Addendum)
 03-25-2024 nephrology consulted for routine HD on T, Th, Sat  03-26-2024 stable. For HD today.  03-27-2024 stable. Continue routine HD on T, Th, Sat  03-28-2024 stable. For HD today. Discussed with nephrology.

## 2024-03-25 NOTE — Assessment & Plan Note (Signed)
 03-25-2024 pt still urinates despite ESRD on HD. On IV rocephin . Limit to 3 days of treatment. No urine cx sent.  03-26-2024 Discussed with pharmacy. Don't think pt has a UTI. Going to stop IV rocephin .   03-27-2024 resolved.

## 2024-03-25 NOTE — ED Notes (Signed)
 Pt positive c diff test relayed face to face with EDP

## 2024-03-25 NOTE — ED Notes (Signed)
 Rn and paramedic cleaned pt pt placed on fresh chucks.

## 2024-03-25 NOTE — Progress Notes (Signed)
 Transition of Care [facility]) - Inpatient Brief Assessment   Patient Details  Name: [name] MRN: [id number] Date of Birth: 1956-07-04  [facility] CM/SW Contact:    Rosaline JONELLE Joe, RN Phone Number: 03/25/2024, 2:32 PM   Clinical Narrative: CM met with the patient at the bedside to discuss IP Care management needs.  The patient admitted for nausea, vomiting and sacral decubitus wounds.  Patient lives at [facility] LTC facility for past 2 years and plans to return to the facility for care.  Patient normally mobilizes with electric wheelchair at the facility.  Patient was provided with [facility] letter.  [place], MSW with IP Care management team will continue to follow the patient for likely transfer back to the facility when medically stable for discharge.   Transition of Care Asessment: Insurance and Status: (P) Insurance coverage has been reviewed Patient has primary care physician: (P) Yes Home environment has been reviewed: (P) from [facility] LTC facility for past 2 years Prior level of function:: (P) Mining engineer wheelchair at the facility Prior/Current Home Services: (P) No current home services Social Drivers of Health Review: (P) SDOH reviewed needs interventions Readmission risk has been reviewed: (P) Yes Transition of care needs: (P) transition of care needs identified, TOC will continue to follow

## 2024-03-25 NOTE — Assessment & Plan Note (Addendum)
 03-25-2024 stable. On RA  03-26-2024 stable. On RA  03-27-2024 stable. On RA  03-28-2024 stable. On RA.

## 2024-03-25 NOTE — ED Notes (Signed)
 Pharmacy messaged regarding missing dose of meds

## 2024-03-25 NOTE — ED Notes (Signed)
 Pt requested to be placed on bedpan. Rn got assistance placing pt on bedpan.

## 2024-03-25 NOTE — Assessment & Plan Note (Addendum)
 03-25-2024 stable. On Eliquis .  03-26-2024 stable.   03-27-2024 stable. On Eliquis .  03-28-2024 stable.

## 2024-03-25 NOTE — Consult Note (Addendum)
 [name] Consultation Note    Indication for Consultation:  Management of ESRD/hemodialysis, anemia, hypertension/volume, and secondary hyperparathyroidism.  HPI: [name] is a 68 y.o. male with ESRD on HD TTS, HTN, T2DM, PAD (s/p B BKA) who was admitted with C.diff infection.   Had presented to ED yesterday with nausea and diarrhea - worsened on day of presentation. He was also concerned about a sacral wound. In ED, he was slightly hypertensive, but afebrile. Labs with Na 136, K 3.4, BUN 25, WBC 9.3, Hgb 9.5, Plts 147. UA with hazy appearing and many WBCs. C-diff checked which was positive. Denies recent abx use. He was admitted and started on Ceftriaxone  for possible UTI and Dificid  for [facility] C.diff.  Seen in room today - diarrhea is a little less. No fever, chills. No CP, dyspnea, abd pain.  Dialyzes on TTS schedule at [name] using LUE AVF. Denies recent HD issues, but looking at his outpatient records - appears he is missing quite a bit of HD sessions recently.   Past Medical History:  Diagnosis Date   Carotid artery occlusion 11/10/2010   LEFT CAROTID ENDARTERECTOMY   Complication of anesthesia    BP WENT UP AT [name]    COPD (chronic obstructive pulmonary disease) (HCC)    pt denies this dx as of 06/01/20 - no inhaler    Diabetes mellitus without complication (HCC)    Diverticulitis    Diverticulosis of colon (without mention of hemorrhage)    DJD (degenerative joint disease)    knees/hands/feet/back/neck   ESRD (end stage renal disease) on dialysis (HCC)    Fatty liver    Full dentures    GERD (gastroesophageal reflux disease)    H/O hiatal hernia    History of blood transfusion    with a past surical procedure per patient 06/01/20   Hyperlipidemia    Hypertension    Neuromuscular disorder (HCC)    peripheral neuropathy   Non-pressure chronic ulcer of other part of left foot limited to breakdown of skin (HCC) 11/12/2016   Nonrheumatic aortic (valve)  stenosis 03/18/2024   Limited TTE 03/18/2024: EF 55-60, moderate LVH, normal RVSF, moderate LAE, mild MR, mild-moderate AS (mean 12, V-max 235 cm/s, DI 0.35), aortic root 39 mm, moderate pleural effusion, trivial pericardial effusion    Osteomyelitis (HCC)    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 Atherectomy left popliteal artery July 2012.    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   Unstable angina (HCC) 09/16/2018   Wears glasses    Past Surgical History:  Procedure Laterality Date   ABDOMINAL AORTOGRAM W/LOWER EXTREMITY N/A 06/23/2021   Procedure: ABDOMINAL AORTOGRAM W/LOWER EXTREMITY;  Surgeon: Elmira Newman PARAS, MD;  [facility];  Service: Cardiovascular;  Laterality: N/A;   AMPUTATION  11/05/2011   Procedure: AMPUTATION RAY;  Surgeon: Norleen Armor, MD;  Location: [facility];  Service: Orthopedics;  Laterality: Right;  Amputation of Right 4&5th Toes   AMPUTATION Left 11/26/2012   Procedure: AMPUTATION RAY;  Surgeon: Norleen Armor, MD;  Location: [facility];  Service: Orthopedics;  Laterality: Left;  fourth ray amputation   AMPUTATION Right 08/27/2014   Procedure: Transmetatarsal Amputation;  Surgeon: Jerona Harden GAILS, MD;  Location: [name];  Service: Orthopedics;  Laterality:  Right;   AMPUTATION Right 01/14/2015   Procedure: AMPUTATION BELOW KNEE;  Surgeon: Jerona Harden GAILS, MD;  [facility]: [facility];  Service: Orthopedics;  Laterality: Right;   AMPUTATION Left 10/21/2015   Procedure: Left Foot 5th Ray Amputation;  Surgeon: Jerona Harden GAILS, MD;  Location: [name];  Service: Orthopedics;  Laterality: Left;   AMPUTATION Left 07/04/2022   Procedure: LEFT BELOW KNEE AMPUTATION;  Surgeon: Harden Jerona GAILS, MD;  Location: [name];  Service: Orthopedics;  Laterality: Left;   ANTERIOR FUSION CERVICAL SPINE  02/06/06   C4-5, C5-6,  C6-7; SURGEON DR. MAX COHEN   AV FISTULA PLACEMENT Left 06/02/2020   Procedure: ARTERIOVENOUS (AV) FISTULA CREATION LEFT;  Surgeon: Sheree Penne Bruckner, MD;  Location: [name];  Service: Vascular;  Laterality: Left;   BACK SURGERY     x 3   BASCILIC VEIN TRANSPOSITION Left 07/21/2020   Procedure: LEFT UPPER ARM ATERIOVENOUS SUPERFISTULALIZATION;  Surgeon: Sheree Penne Bruckner, MD;  Location: [facility] OR;  Service: Vascular;  Laterality: Left;   BELOW KNEE LEG AMPUTATION Right    CARDIAC CATHETERIZATION  10/31/04   2009   CAROTID ENDARTERECTOMY  11/10/10   CAROTID ENDARTERECTOMY Left 11/10/2010   Subtotal occlusion of left internal carotid artery with left hemispheric transient ischemic attacks.   CAROTID STENT     CARPAL TUNNEL RELEASE Right 10/21/2013   Procedure: RIGHT CARPAL TUNNEL RELEASE;  Surgeon: Arley JONELLE Curia, MD;  Location: [facility];  Service: Orthopedics;  Laterality: Right;   CHOLECYSTECTOMY     COLON SURGERY     COLONOSCOPY     COLOSTOMY REVERSAL  05/21/2018   ileostomy reversal   CORONARY LITHOTRIPSY N/A 11/24/2023   Procedure: CORONARY LITHOTRIPSY;  Surgeon: Swaziland, Peter M, MD;  Location: [facility];  Service: Cardiovascular;  Laterality: N/A;   CORONARY STENT INTERVENTION N/A 11/24/2023   Procedure: CORONARY STENT INTERVENTION;  Surgeon: Swaziland, Peter M, MD;  Location: [facility] INVASIVE CV LAB;  Service: Cardiovascular;  Laterality: N/A;   CYSTOSCOPY WITH STENT PLACEMENT Bilateral 01/13/2018   Procedure: CYSTOSCOPY WITH BILATERAL URETERAL CATHETER PLACEMENT;  Surgeon: Cam Morene ORN, MD;  Location: [facility]S;  Service: Urology;  Laterality: Bilateral;   ESOPHAGEAL MANOMETRY Bilateral 07/19/2014   Procedure: ESOPHAGEAL MANOMETRY (EM);  Surgeon: Gordy CHRISTELLA Starch, MD;  Location: [facility];  Service: Gastroenterology;  Laterality: Bilateral;   EYE SURGERY Bilateral 2020   cataract   FEMORAL ARTERY STENT     x6   FINGER SURGERY     FOOT SURGERY  04/25/2016     EXCISION BASE 5TH METATARSAL AND PARTIAL CUBOID LEFT FOOT   HERNIA REPAIR     LEFT INGUINAL AND UMBILICAL REPAIRS   HERNIA REPAIR     I & D EXTREMITY Left 04/25/2016   Procedure: EXCISION BASE 5TH METATARSAL AND PARTIAL CUBOID LEFT FOOT;  Surgeon: Jerona GAILS Harden, MD;  [facility]: [facility];  Service: Orthopedics;  Laterality: Left;   ILEOSTOMY  01/13/2018   Procedure: ILEOSTOMY;  Surgeon: Signe Mitzie LABOR, MD;  Location: [facility];  Service: General;;   ILEOSTOMY CLOSURE N/A 05/21/2018   Procedure: ILEOSTOMY REVERSAL ERAS PATHWAY;  Surgeon: Signe Mitzie LABOR, MD;  [facility]: [facility];  Service: General;  Laterality: N/A;   IR RADIOLOGIST EVAL & MGMT  11/19/2017   IR RADIOLOGIST EVAL & MGMT  12/03/2017   IR RADIOLOGIST EVAL & MGMT  12/18/2017   JOINT REPLACEMENT Right 2001   Total knee   LAMINECTOMY     X 3  LUMBAR AND X 2 CERVICAL SPINE OPERATIONS   LAPAROSCOPIC CHOLECYSTECTOMY W/ CHOLANGIOGRAPHY  11/09/04   SURGEON DR. DEWARD CANDIE NULL   LEFT HEART CATH AND CORONARY ANGIOGRAPHY N/A 09/16/2018   Procedure: LEFT HEART CATH AND CORONARY ANGIOGRAPHY;  Surgeon: Elmira Newman PARAS, MD;  [facility];  Service: Cardiovascular;  Laterality: N/A;   LEFT HEART CATH AND CORONARY ANGIOGRAPHY N/A 11/24/2023   Procedure: LEFT HEART CATH AND CORONARY ANGIOGRAPHY;  Surgeon: Swaziland, Peter M, MD;  Location: [name] INVASIVE CV LAB;  Service: Cardiovascular;  Laterality: N/A;   LEFT HEART CATHETERIZATION WITH CORONARY ANGIOGRAM N/A 10/29/2014   Procedure: LEFT HEART CATHETERIZATION WITH CORONARY ANGIOGRAM;  Surgeon: Erick JONELLE Bergamo, MD;  Location: [facility] CATH LAB;  Service: Cardiovascular;  Laterality: N/A;   LIGATION OF COMPETING BRANCHES OF ARTERIOVENOUS FISTULA Left 07/21/2020   Procedure: LIGATION OF COMPETING BRANCHES OF LEFT UPPER ARM ARTERIOVENOUS FISTULA;  Surgeon: Sheree Penne Bruckner, MD;  Location: [facility] OR;  Service: Vascular;  Laterality: Left;   LOWER EXTREMITY ANGIOGRAM N/A 03/19/2012   Procedure: LOWER  EXTREMITY ANGIOGRAM;  Surgeon: Bruckner JONETTA Cash, MD;  Location: [facility] CATH LAB;  Service: Cardiovascular;  Laterality: N/A;   LOWER EXTREMITY ANGIOGRAPHY N/A 06/20/2021   Procedure: LOWER EXTREMITY ANGIOGRAPHY;  Surgeon: Elmira Newman PARAS, MD;  [facility];  Service: Cardiovascular;  Laterality: N/A;   NECK SURGERY     PARTIAL COLECTOMY N/A 01/13/2018   Procedure: LAPAROSCOPIC ASSISTED   SIGMOID COLECTOMY ILEOSTOMY;  Surgeon: Signe Mitzie LABOR, MD;  Location: [facility];  Service: General;  Laterality: N/A;   PENILE PROSTHESIS IMPLANT  08/14/05   INFRAPUBIC INSERTION OF INFLATABLE PENILE PROSTHESIS; SURGEON DR. JANIT   PENILE PROSTHESIS IMPLANT     PERCUTANEOUS CORONARY STENT INTERVENTION (PCI-S) Right 10/29/2014   Procedure: PERCUTANEOUS CORONARY STENT INTERVENTION (PCI-S);  Surgeon: Erick JONELLE Bergamo, MD;  Location: [name] CATH LAB;  Service: Cardiovascular;  Laterality: Right;   PERICARDIOCENTESIS N/A 02/13/2024   Procedure: PERICARDIOCENTESIS;  Surgeon: Elmira Newman PARAS, MD;  [facility];  Service: Cardiovascular;  Laterality: N/A;   PERIPHERAL VASCULAR INTERVENTION Left 06/23/2021   Procedure: PERIPHERAL VASCULAR INTERVENTION;  Surgeon: Elmira Newman PARAS, MD;  [facility];  Service: Cardiovascular;  Laterality: Left;   REMOVAL OF PENILE PROSTHESIS N/A 06/14/2021   Procedure: Removal of THREE piece inflatable penile prosthesis;  Surgeon: Carolee Sherwood JONETTA DOUGLAS, MD;  Location: [name];  Service: Urology;  Laterality: N/A;   SHOULDER ARTHROSCOPY     SPINE SURGERY     TEMPORARY PACEMAKER N/A 02/13/2024   Procedure: TEMPORARY PACEMAKER;  Surgeon: Elmira Newman PARAS, MD;  [facility];  Service: Cardiovascular;  Laterality: N/A;   TOE AMPUTATION Left    TONSILLECTOMY     TOTAL KNEE ARTHROPLASTY  07/2002   RIGHT KNEE ; SURGEON  DR. [name] ALSO HAD ARTHROSCOPIC RIGHT KNEE IN  10/2001   TOTAL KNEE ARTHROPLASTY     ULNAR NERVE  TRANSPOSITION Right 10/21/2013   Procedure: RIGHT ELBOW  ULNAR NERVE DECOMPRESSION;  Surgeon: Arley JONELLE Curia, MD;  Location: [facility];  Service: Orthopedics;  Laterality: Right;   Family History  Problem Relation Age of Onset   Heart disease Father        Before age 67-  CAD, BPG   Diabetes Father        Amputation   Cancer Father        PROSTATE   Hyperlipidemia Father  Hypertension Father    Heart attack Father        Triple BPG   Varicose Veins Father    Cancer Sister        Breast   Hyperlipidemia Sister    Hypertension Sister    Heart attack Brother    Colon cancer Brother    Diabetes Brother    Heart disease Brother 76       A-Fib. Before age 9   Hyperlipidemia Brother    Hypertension Brother    Hypertension Son    Arthritis Other        GRANDMOTHER   Hypertension Other        OTHER FAMILY MEMBERS  States his brother and his father had CKD; brother was on HD   Social History:  reports that he has been smoking cigarettes. He has a 35 pack-year smoking history. He has never used smokeless tobacco. He reports that he does not currently use alcohol . He reports that he does not use drugs.  ROS: As per HPI otherwise negative.  Physical Exam: Vitals:   03/25/24 0800 03/25/24 0828 03/25/24 0900 03/25/24 0908  BP: (!) 141/55  (!) 197/79 (!) 197/79  Pulse: 67  77 77  Resp: 19   18  Temp:  98.9 F (37.2 C) 98.2 F (36.8 C) 98.2 F (36.8 C)  TempSrc:  Oral Oral   SpO2: 100%  100% 100%  Weight:      Height:         General: Well developed, well nourished, in no acute distress. Room air Head: Normocephalic, atraumatic, sclera non-icteric, mucus membranes are moist. Neck: Supple without lymphadenopathy/masses. JVD not elevated. Lungs: Clear bilaterally to auscultation without wheezes, rales, or rhonchi. Breathing is unlabored. Heart: RRR with normal S1, S2. No murmurs, rubs, or gallops appreciated. Abdomen: Soft, non-tender, non-distended with  normoactive bowel sounds.  Musculoskeletal:  Strength and tone appear normal for age. Lower extremities: Bilateral BKA, 2+ B stump edema Neuro: Alert and oriented X 3. Moves all extremities spontaneously. Psych:  Responds to questions appropriately with a normal affect. Dialysis Access: LUE AVF +t/b  Allergies  Allergen Reactions   Contrast Media [Iodinated Contrast Media] Shortness Of Breath and Other (See Comments)    Altered mental status     Ivp Dye [Iodinated Contrast Media] Anaphylaxis, Shortness Of Breath and Other (See Comments)    Altered mental status     Latex Rash    A severe rash appears after Harold first 24 hours of being placed   Tape Rash and Other (See Comments)    Rash after 1 day of use  Do not leave on longer then 3 days without changing   Prior to Admission medications   Medication Sig Start Date End Date Taking? Authorizing Provider  amLODipine  (NORVASC ) 10 MG tablet Take 5 mg by mouth daily.   Yes [provider]  apixaban  (ELIQUIS ) 5 MG TABS tablet Take 1 tablet (5 mg total) by mouth 2 (two) times daily. 01/14/22 09/09/24 Yes Gonfa, Taye T, MD  atorvastatin  (LIPITOR ) 80 MG tablet Take 1 tablet (80 mg total) by mouth at bedtime. 11/25/23  Yes Renne Homans, MD  B Complex-C-Zn-Folic Acid  (DIALYVITE /ZINC ) TABS Take 1 tablet by mouth See admin instructions. Give 1 tablet by mouth 3 days a week prior to dialysis on Tuesday, Thursday, Saturday. 10/04/21  Yes [provider]  clopidogrel  (PLAVIX ) 75 MG tablet Take 1 tablet (75 mg total) by mouth daily with breakfast. Patient taking differently: Take  75 mg by mouth daily. 11/25/23 11/24/24 Yes Amoako, Prince, MD  colchicine  0.6 MG tablet Take 0.5 tablets (0.3 mg total) by mouth daily. 02/21/24  Yes Gonfa, Taye T, MD  diphenoxylate-atropine  (LOMOTIL) 2.5-0.025 MG tablet Take 1 tablet by mouth 2 (two) times daily. for 5 days (7/14-7/19).   Yes [provider]  famotidine  (PEPCID ) 20 MG tablet Take 20  mg by mouth See admin instructions. Give 1 tablet (20mg ) by mouth every other morning.   Yes [provider]  fentaNYL  (DURAGESIC ) 50 MCG/HR Place 1 patch onto Harold skin every 3 (three) days. 07/25/23  Yes [provider]  folic acid  (FOLVITE ) 1 MG tablet Take 1 mg by mouth daily. 09/18/21  Yes [provider]  gabapentin  (NEURONTIN ) 100 MG capsule Take 100 mg by mouth 3 (three) times daily.   Yes [provider]  insulin  aspart (NOVOLOG  FLEXPEN) 100 UNIT/ML FlexPen Inject 4-16 Units into Harold skin See admin instructions. Inject 4 units subcutaneously three times daily plus 0-12 units per sliding scale: 70 - 150 : 0 units 151-200 : 2 units 201-250 : 4 units 251-300 : 6 units 301-350 : 8 units 351-400 : 10 units 401-450 : 12 units > 400 : give 12 units and recheck in 1 hour - if still > 400, call MD/NP for further orders.   Yes [provider]  isosorbide  mononitrate (IMDUR ) 60 MG 24 hr tablet Take 60 mg by mouth daily. Give 60mg  by mouth one time a day   Yes [provider]  lidocaine -prilocaine  (EMLA ) cream Apply 1 application  topically See admin instructions. Apply topically 3 days a week prior to dialysis on Tuesday, Thursday, Saturday. 06/06/21  Yes [provider]  loratadine  (CLARITIN ) 10 MG tablet Take 10 mg by mouth daily.   Yes [provider]  mineral oil-hydrophilic petrolatum (AQUAPHOR) ointment Apply 1 Application topically every 8 (eight) hours as needed for irritation or dry skin.   Yes [provider]  nitroGLYCERIN  (NITROSTAT ) 0.4 MG SL tablet Place 0.4 mg under Harold tongue every 5 (five) minutes as needed for chest pain. 01/06/22  Yes [provider]  oxyCODONE -acetaminophen  (PERCOCET /ROXICET) 5-325 MG tablet Take 1 tablet by mouth every 8 (eight) hours as needed for severe pain. 05/31/23  Yes Ghimire, Donalda HERO, MD  promethazine  (PHENERGAN ) 12.5 MG suppository Place 12.5 mg rectally daily as  needed for nausea or vomiting.   Yes [provider]  rOPINIRole  (REQUIP ) 2 MG tablet Take 2 mg by mouth at bedtime.   Yes [provider]  sevelamer  carbonate (RENVELA ) 800 MG tablet Take 2 tablets (1,600 mg total) by mouth 3 (three) times daily with meals. 02/04/24  Yes Caleen Qualia, MD  tamsulosin  (FLOMAX ) 0.4 MG CAPS capsule Take 0.4 mg by mouth daily.   Yes [provider]   Current Facility-Administered Medications  Medication Dose Route Frequency Provider Last Rate Last Admin   acetaminophen  (TYLENOL ) tablet 650 mg  650 mg Oral Q6H PRN Shona Laurence N, DO       apixaban  (ELIQUIS ) tablet 5 mg  5 mg Oral BID Shona Laurence N, DO   5 mg at 03/25/24 9367   atorvastatin  (LIPITOR ) tablet 80 mg  80 mg Oral QHS Hall, Carole N, DO       cefTRIAXone  (ROCEPHIN ) 1 g in sodium chloride  0.9 % 100 mL IVPB  1 g Intravenous Daily Shona Laurence N, DO 200 mL/hr at 03/25/24 0704 1 g at 03/25/24 0704   clopidogrel  (PLAVIX )  tablet 75 mg  75 mg Oral Q breakfast Shona Terry SAILOR, DO   75 mg at 03/25/24 1020   fidaxomicin  (DIFICID ) tablet 200 mg  200 mg Oral BID Palumbo, April, MD   200 mg at 03/25/24 1140   [name]'s butt cream   Topical BID Shona Terry SAILOR, DO   Given at 03/25/24 1140   polyethylene glycol (MIRALAX  / GLYCOLAX ) packet 17 g  17 g Oral Daily PRN Shona Terry SAILOR, DO       prochlorperazine  (COMPAZINE ) injection 5 mg  5 mg Intravenous Q6H PRN Shona Terry N, DO       sevelamer  carbonate (RENVELA ) tablet 1,600 mg  1,600 mg Oral TID WC Shona Terry N, DO   1,600 mg at 03/25/24 1020   Labs: Basic Metabolic Panel: Recent Labs  Lab 03/19/24 0022 03/24/24 1807 03/25/24 1100  NA 140 136 137  K 5.7* 3.4* 3.7  CL 103 96* 97*  CO2 22 27 24   GLUCOSE 82 144* 127*  BUN 54* 25* 32*  CREATININE 7.33* 4.93* 5.75*  CALCIUM  8.3* 7.3* 8.0*  PHOS  --   --  5.3*   Liver Function Tests: Recent Labs  Lab 03/24/24 1807 03/25/24 1100  AST 18  --   ALT 11  --   ALKPHOS 95  --    BILITOT 0.9  --   PROT 5.5*  --   ALBUMIN  2.6* 2.7*   CBC: Recent Labs  Lab 03/19/24 0022 03/24/24 1807  WBC 7.1 9.3  NEUTROABS 5.6 7.6  HGB 9.2* 9.5*  HCT 30.4* 30.1*  MCV 99.0 93.5  PLT 138* 147*   Dialysis Orders:  TTS - [facility] -> full HD 7/15, but left at 119.3kg (way up), had missed HD on 7/12 and Harold week before? Last HD 7/5 4:15hr, 450/800, EDW 108kg, 2K/2Ca bath, AVF, UFP #2, no heparin  - Mircera 200mcg IV q 2 weeks (last 7/5) - Venofer  50mg  IV weekly - Hectoral 2mcg IV q HD  Assessment/Plan:  C. Diff colitis: On Dificid  BID - follow.  ?UTI: Cx pending, on Ceftriaxone .  ESRD:  Continue HD on TTS schedule - next tomorrow, using AVF. Appears has been missing a lot of HD recently, will d/w patient and try to solve any issues.  Hypertension/volume: BP high, does have BLE edema - large UFG as tolerated.  Anemia: Hgb 9.5 - resume ESA as outpatient  Metabolic bone disease: Ca/Phos ok - continue home meds.  [name], PA-C 03/25/2024, 12:29 PM  [name]    Seen and examined independently.  Agree with note and exam as documented above by physician extender and as noted here.  [name] is a pleasant gentleman with a history of ESRD on HD at [facility] who presented to [name] with diarrhea.  He was diagnosed with C. Difficile colitis.  He has been missing dialysis frequently recently and he cites diarrhea and GI issues.  He is does not think that he sees a gastroenterologist.  Not having nausea or vomiting.  He denies any issues with his breathing.  He states that he has had some swelling of his bilateral residual limbs recently.  Nephrology is consulted for assistance with management of ESRD and hemodialysis.   General adult male in bed in no acute distress HEENT normocephalic atraumatic extraocular movements intact sclera anicteric Neck supple trachea midline Lungs clear to auscultation bilaterally normal work of breathing at rest  Heart S1S2 no  rub Abdomen soft nontender nondistended Extremities 1+ edema  of bilateral residual limbs  Psych normal mood and affect Neuro alert and oriented x 3 provides hx and follows commands  Access LUE AVF with bruit and thrill; (states sticking away from healing lesion on his AVF)  # End-stage renal disease HD tomorrow per TTS schedule  # C. difficile colitis Most recently with diarrhea. Regimen per primary team. On fidaxomicin  Note He does still appear overloaded on exam  # Hypertension - Optimize volume status with HD  # Anemia of CKD - agree - can resume ESA outpatient  # Metabolic bone disease - Hyperphosphatemia resume binders - He is reported above is having colon surgery and has diarrhea preventing him from coming to HD.  I imagine this has been investigated outpatient but for right now would recommend changing to PhosLo  inpatient and then discussing best outpatient regimen for him  Thank you for Harold consult.  Please do not hesitate to contact me with any questions regarding our patient  [name], MD 03/25/2024  7:53 PM

## 2024-03-25 NOTE — Assessment & Plan Note (Addendum)
 03-25-2024 continue with Bid dificid . Pt states diarrhea is improving. Nurse tech verifies that diarrhea consistency is getting thicker.  03-26-2024 pt states diarrhea is improving but not gone. Continue with dificid  bid.  03-27-2024 Having type 6(clay like) BM according to nursing documentation. No fevers. Had HD yesterday. Discussed with pharmacy. Will complete 10 days of dificid  and then 20 days taper(every other day dosing) at discharge. Stable for DC back to SNF.   03-28-2024 Only 1 stool recorded yesterday. Type 6(mushy). Remains on Dificid . Will place on tapering dose at SNF when he is discharged.

## 2024-03-25 NOTE — Assessment & Plan Note (Addendum)
 03-25-2024 present on admission. Seen by wound care.          03-26-2024 continue with wound care recs Noted to have stage 2 pressure injury to buttock in March 2025.  Has had recent episode of watery stool and skin breakdown is consistent with pressure and moisture.  Staff in room and assist with turning patient to assess wound.  Has deep tissue injury to bilateral posterior thighs, consistent with prolonged time on bedpan.  Present on admission.  Intact at this time.  Wound type: moisture and pressure Deep tissue injury  Pressure Injury POA: Yes Measurement: Bilateral posterior thighs:  Curved maroon discoloration that matches bedpan pattern.  8 cm x 1 cm intact Coccyx:  4 cmx 3 cm x 0.2 cm nonintact , consistent with stage 2 pressure injury Bilateral buttocks with partial thickness tissue loss in gluteal fold.   Posterior scrotal skin breakdown, scattered weeping nonintact areas.  Wound bed: red and moist Drainage (amount, consistency, odor) minimal serosanguinous  no odor Periwound: Dry skin, frequent exposure to loose stool and urine.  Dressing procedure/placement/frequency: Cleanse buttock/sacral and perineal skin with soap and water  and pat dry. Sacral foam to coccyx to pad and protect.  Gerhardts to bilateral gluteal folds, scrotum and perineal breakdown.(incontinence associated dermatitis)   Patient with bilateral BKA, limited bed mobility.  Some scabbing to distal end of amputation sites, bilaterally.  Consistent with trauma from transfers, suture lines are intact and healed.  Recommend Low air loss mattress.   03-27-2024 DC to SNF with wound care orders.  03-28-2024 stable for DC to SNF with wound care orders.

## 2024-03-25 NOTE — Hospital Course (Signed)
 HPI: [name] is a 68 y.o. male with medical history significant for ESRD on HD TTS, C-diff infection, sacral decubitus ulcer, type 2 diabetes, hyperlipidemia, hypertension, COPD, peripheral artery disease status post bilateral below the knee amputation, paroxysmal A-fib on Eliquis , coronary artery disease status post PCI with drug-eluting stent placed on 11/24/2023 on Plavix , who presents to the ER due to persistent nausea for the past few days, watery stool for weeks, and a sacral decubitus wound.   In the ER, UA positive for pyuria.  C. difficile positive.  Started on Rocephin  and Fidaxomicin .  Wound care specialist consulted for the sacral decubitus ulcer.  TRH, hospitalist service, was asked to admit.   ED Course: Temperature 99.0.  BP 109/50, pulse 72, respiratory 18, saturation 97% on room air.  Lab studies notable for WBC 9.3, platelet count 147, hemoglobin 9.5.  Significant Events: Admitted 03/24/2024 for sacral decub ulcer   Admission Labs: WBC 9.3, HgB 9.5, plt 147 Na 136, K 3.4, CO2 of 27, BUN 25, Scr 4.93, glu 144 T prot 5.5, alb 2.6, AST 18, ALT 11 UA negative nitrite, large LE, WBC >50, few bacteria C. Diff antigen positive, Toxin positive  Admission Imaging Studies:   Significant Labs:   Significant Imaging Studies:   Antibiotic Therapy: Anti-infectives (From admission, onward)    Start     Dose/Rate Route Frequency Ordered Stop   03/25/24 0700  cefTRIAXone  (ROCEPHIN ) 1 g in sodium chloride  0.9 % 100 mL IVPB        1 g 200 mL/hr over 30 Minutes Intravenous Daily 03/25/24 0624     03/25/24 0030  fidaxomicin  (DIFICID ) tablet 200 mg        200 mg Oral 2 times daily 03/25/24 0019 04/03/24 2159       Procedures:   Consultants: nephrology

## 2024-03-25 NOTE — Assessment & Plan Note (Signed)
 Body mass index is 32.1 kg/m.

## 2024-03-25 NOTE — Assessment & Plan Note (Addendum)
 03-25-2024 stable.  03-26-2024 stable.   03-27-2024 stable. Discharge HgB 9.6 g/dl  [id number] stable.

## 2024-03-25 NOTE — Progress Notes (Signed)
 PROGRESS NOTE    [name]  FMW:[id number] DOB: 07/16/1956 DOA: 03/24/2024 PCP: Montey Lot, PA-C  Subjective: Pt seen and examined. Diarrhea slowing down. Only 2-3 episodes today. NT say that diarrhea is getting thicker. Not so runny. Pt still urinates despite ERSD. Pt lives at [facility]. Has HD on T, Th, Sat.   Hospital Course: HPI: [name] is a 68 y.o. male with medical history significant for ESRD on HD TTS, C-diff infection, sacral decubitus ulcer, type 2 diabetes, hyperlipidemia, hypertension, COPD, peripheral artery disease status post bilateral below the knee amputation, paroxysmal A-fib on Eliquis , coronary artery disease status post PCI with drug-eluting stent placed on 11/24/2023 on Plavix , who presents to the ER due to persistent nausea for the past few days, watery stool for weeks, and a sacral decubitus wound.   In the ER, UA positive for pyuria.  C. difficile positive.  Started on Rocephin  and Fidaxomicin .  Wound care specialist consulted for the sacral decubitus ulcer.  TRH, hospitalist service, was asked to admit.   ED Course: Temperature 99.0.  BP 109/50, pulse 72, respiratory 18, saturation 97% on room air.  Lab studies notable for WBC 9.3, platelet count 147, hemoglobin 9.5.  Significant Events: Admitted 03/24/2024 for sacral decub ulcer   Admission Labs: WBC 9.3, HgB 9.5, plt 147 Na 136, K 3.4, CO2 of 27, BUN 25, Scr 4.93, glu 144 T prot 5.5, alb 2.6, AST 18, ALT 11 UA negative nitrite, large LE, WBC >50, few bacteria C. Diff antigen positive, Toxin positive  Admission Imaging Studies:   Significant Labs:   Significant Imaging Studies:   Antibiotic Therapy: Anti-infectives (From admission, onward)    Start     Dose/Rate Route Frequency Ordered Stop   03/25/24 0700  cefTRIAXone  (ROCEPHIN ) 1 g in sodium chloride  0.9 % 100 mL IVPB        1 g 200 mL/hr over 30 Minutes Intravenous Daily 03/25/24 0624     03/25/24 0030  fidaxomicin  (DIFICID )  tablet 200 mg        200 mg Oral 2 times daily 03/25/24 0019 04/03/24 2159       Procedures:   Consultants: nephrology    Assessment and Plan: * Decubitus ulcer of sacral region, stage 2 (HCC) 03-25-2024 present on admission. Seen by wound care.          Recurrent Clostridioides difficile diarrhea 03-25-2024 continue with Bid dificid . Pt states diarrhea is improving. Nurse tech verifies that diarrhea consistency is getting thicker.  Acute cystitis without hematuria 03-25-2024 pt still urinates despite ESRD on HD. On IV rocephin . Limit to 3 days of treatment. No urine cx sent.  ESRD (end stage renal disease) on dialysis [facility]) 03-25-2024 nephrology consulted for routine HD on T, Th, Sat  Paroxysmal atrial fibrillation (HCC) 03-25-2024 stable. On Eliquis .  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 Pt lives at [facility]. He plans on returning there.  Anemia due to chronic kidney disease 03-25-2024 stable.  COPD (chronic obstructive pulmonary disease) (HCC) 03-25-2024 stable. On RA  Type 2 diabetes mellitus with diabetic neuropathy (HCC) 03-25-2024 on SSI  GERD (gastroesophageal reflux disease) 03-25-2024 stable. Holding pepcid  and PPI due to recurrent C. Diff.   DVT prophylaxis:  apixaban  (ELIQUIS ) tablet 5 mg     Code Status: Full Code Family Communication: no family at bedside. Pt is decisional. Disposition Plan: return to [facility] Reason for continuing need for hospitalization: continue to treat C. Diff  diarrhea. Monitoring diarrhea output.  Objective: Vitals:   03/25/24 0828 03/25/24 0900 03/25/24 0908 03/25/24 1614  BP:  (!) 197/79 (!) 197/79 (!) 171/74  Pulse:  77 77 74  Resp:   18 17  Temp: 98.9 F (37.2 C) 98.2 F (36.8 C) 98.2 F (36.8 C) 98.5 F (36.9 C)  TempSrc: Oral Oral    SpO2:  100% 100% 100%  Weight:      Height:       No intake or output data in the 24 hours ending 03/25/24  1729 Filed Weights   03/24/24 1722  Weight: 113.4 kg    Examination:  Physical Exam Vitals and nursing note reviewed.  Constitutional:      Appearance: He is obese.  HENT:     Head: Normocephalic and atraumatic.     Nose: Nose normal.  Cardiovascular:     Rate and Rhythm: Normal rate and regular rhythm.  Pulmonary:     Effort: Pulmonary effort is normal.     Breath sounds: Normal breath sounds.  Abdominal:     General: Bowel sounds are normal. There is no distension.     Palpations: Abdomen is soft.  Musculoskeletal:     Comments: Bilateral BKA  Skin:    Capillary Refill: Capillary refill takes less than 2 seconds.     Comments: See pictures of sacral decub ulcer  Neurological:     Mental Status: He is alert and oriented to person, place, and time.    Data Reviewed: I have personally reviewed following labs and imaging studies  CBC: Recent Labs  Lab 03/19/24 0022 03/24/24 1807  WBC 7.1 9.3  NEUTROABS 5.6 7.6  HGB 9.2* 9.5*  HCT 30.4* 30.1*  MCV 99.0 93.5  PLT 138* 147*   Basic Metabolic Panel: Recent Labs  Lab 03/19/24 0022 03/24/24 1807 03/25/24 1100  NA 140 136 137  K 5.7* 3.4* 3.7  CL 103 96* 97*  CO2 22 27 24   GLUCOSE 82 144* 127*  BUN 54* 25* 32*  CREATININE 7.33* 4.93* 5.75*  CALCIUM  8.3* 7.3* 8.0*  PHOS  --   --  5.3*   GFR: Estimated Creatinine Clearance: 16.2 mL/min (A) (by C-G formula based on SCr of 5.75 mg/dL (H)). Liver Function Tests: Recent Labs  Lab 03/24/24 1807 03/25/24 1100  AST 18  --   ALT 11  --   ALKPHOS 95  --   BILITOT 0.9  --   PROT 5.5*  --   ALBUMIN  2.6* 2.7*   BNP (last 3 results) Recent Labs    02/12/24 1638  BNP 387.9*   CBG: Recent Labs  Lab 03/24/24 1832  GLUCAP 133*   Recent Results (from the past 240 hours)  C Difficile Quick Screen w PCR reflex     Status: Abnormal   Collection Time: 03/24/24  9:21 PM   Specimen: STOOL  Result Value Ref Range Status   C Diff antigen POSITIVE (A) NEGATIVE  Final   C Diff toxin POSITIVE (A) NEGATIVE Final   C Diff interpretation Toxin producing C. difficile detected.  Final    Comment: RESULT CALLED TO, READ BACK BY AND VERIFIED WITH: B NORMENT RN 03/25/2024 @ 0017 BY AB Performed at [facility], [address] [place], [place] [address]     Scheduled Meds:  apixaban   5 mg Oral BID   atorvastatin   80 mg Oral QHS   clopidogrel   75 mg Oral Q breakfast   fidaxomicin   200 mg  Oral BID   [name]'s butt cream   Topical BID   sevelamer  carbonate  1,600 mg Oral TID WC   Continuous Infusions:  cefTRIAXone  (ROCEPHIN )  IV 1 g (03/25/24 0704)     LOS: 0 days   Time spent: 50 minutes  Camellia Door, DO  [facility]  03/25/2024, 5:29 PM

## 2024-03-25 NOTE — Consult Note (Signed)
 WOC Nurse Consult Note: Reason for Consult:Asked to consult for skin breakdown to buttocks and sacrum, present on admission.  Noted to have stage 2 pressure injury to buttock in March 2025.  Has had recent episode of watery stool and skin breakdown is consistent with pressure and moisture.  Staff in room and assist with turning patient to assess wound.  Has deep tissue injury to bilateral posterior thighs, consistent with prolonged time on bedpan.  Present on admission.  Intact at this time.  Wound type: moisture and pressure Deep tissue injury  Pressure Injury POA: Yes Measurement: Bilateral posterior thighs:  Curved maroon discoloration that matches bedpan pattern.  8 cm x 1 cm intact Coccyx:  4 cmx 3 cm x 0.2 cm nonintact , consistent with stage 2 pressure injury Bilateral buttocks with partial thickness tissue loss in gluteal fold.   Posterior scrotal skin breakdown, scattered weeping nonintact areas.  Wound bed: red and moist Drainage (amount, consistency, odor) minimal serosanguinous  no odor Periwound: Dry skin, frequent exposure to loose stool and urine.  Dressing procedure/placement/frequency: Cleanse buttock/sacral and perineal skin with soap and water  and pat dry. Sacral foam to coccyx to pad and protect.  Gerhardts to bilateral gluteal folds, scrotum and perineal breakdown.(incontinence associated dermatitis)   Patient with bilateral BKA, limited bed mobility.  Some scabbing to distal end of amputation sites, bilaterally.  Consistent with trauma from transfers, suture lines are intact and healed.  Recommend Low air loss mattress.  Will not follow at this time.  Please re-consult if needed.  Darice Cooley MSN, RN, FNP-BC CWON Wound, Ostomy, Continence Nurse Outpatient [facility] [phone] Pager [phone]

## 2024-03-25 NOTE — Telephone Encounter (Signed)
 Patient Product/process development scientist completed.    The patient is insured through [employer]. Patient has Medicare and is not eligible for a copay card, but may be able to apply for patient assistance or Medicare RX Payment Plan (Patient Must reach out to their plan, if eligible for payment plan), if available.    Ran test claim for Dificid  200 mg and the current 10 day co-pay is $0.00.   This test claim was processed through [facility]- copay amounts may vary at other pharmacies due to pharmacy/plan contracts, or as the patient moves through the different stages of their insurance plan.     Reyes Sharps, CPHT Pharmacy Technician III Certified Patient Advocate [facility] Patient Advocate Team Direct Number: [phone]  Fax: [phone]

## 2024-03-25 NOTE — Assessment & Plan Note (Addendum)
 03-25-2024 on SSI  03-26-2024 stable. CBG ranges acceptable.  03-27-2024 stable  03-28-2024 stable.

## 2024-03-25 NOTE — Plan of Care (Signed)
   Problem: Education: Goal: Knowledge of General Education information will improve Description: Including pain rating scale, medication(s)/side effects and non-pharmacologic comfort measures Outcome: Progressing   Problem: Nutrition: Goal: Adequate nutrition will be maintained Outcome: Progressing   Problem: Elimination: Goal: Will not experience complications related to bowel motility Outcome: Progressing

## 2024-03-26 DIAGNOSIS — D631 Anemia in chronic kidney disease: Secondary | ICD-10-CM | POA: Diagnosis not present

## 2024-03-26 DIAGNOSIS — E66811 Obesity, class 1: Secondary | ICD-10-CM

## 2024-03-26 DIAGNOSIS — I480 Paroxysmal atrial fibrillation: Secondary | ICD-10-CM | POA: Diagnosis not present

## 2024-03-26 DIAGNOSIS — E1122 Type 2 diabetes mellitus with diabetic chronic kidney disease: Secondary | ICD-10-CM | POA: Diagnosis not present

## 2024-03-26 DIAGNOSIS — N250 Renal osteodystrophy: Secondary | ICD-10-CM | POA: Diagnosis not present

## 2024-03-26 DIAGNOSIS — N3000 Acute cystitis without hematuria: Secondary | ICD-10-CM | POA: Diagnosis not present

## 2024-03-26 DIAGNOSIS — Z7401 Bed confinement status: Secondary | ICD-10-CM | POA: Diagnosis not present

## 2024-03-26 DIAGNOSIS — Z7901 Long term (current) use of anticoagulants: Secondary | ICD-10-CM | POA: Diagnosis not present

## 2024-03-26 DIAGNOSIS — R531 Weakness: Secondary | ICD-10-CM | POA: Diagnosis not present

## 2024-03-26 DIAGNOSIS — Z89512 Acquired absence of left leg below knee: Secondary | ICD-10-CM | POA: Diagnosis not present

## 2024-03-26 DIAGNOSIS — L89312 Pressure ulcer of right buttock, stage 2: Secondary | ICD-10-CM | POA: Diagnosis not present

## 2024-03-26 DIAGNOSIS — Z89611 Acquired absence of right leg above knee: Secondary | ICD-10-CM | POA: Diagnosis not present

## 2024-03-26 DIAGNOSIS — Z992 Dependence on renal dialysis: Secondary | ICD-10-CM | POA: Diagnosis not present

## 2024-03-26 DIAGNOSIS — A0471 Enterocolitis due to Clostridium difficile, recurrent: Secondary | ICD-10-CM | POA: Diagnosis not present

## 2024-03-26 DIAGNOSIS — I120 Hypertensive chronic kidney disease with stage 5 chronic kidney disease or end stage renal disease: Secondary | ICD-10-CM | POA: Diagnosis not present

## 2024-03-26 DIAGNOSIS — Z91199 Patient's noncompliance with other medical treatment and regimen due to unspecified reason: Secondary | ICD-10-CM | POA: Diagnosis not present

## 2024-03-26 DIAGNOSIS — N186 End stage renal disease: Secondary | ICD-10-CM | POA: Diagnosis not present

## 2024-03-26 DIAGNOSIS — L89159 Pressure ulcer of sacral region, unspecified stage: Secondary | ICD-10-CM | POA: Diagnosis present

## 2024-03-26 DIAGNOSIS — J449 Chronic obstructive pulmonary disease, unspecified: Secondary | ICD-10-CM | POA: Diagnosis not present

## 2024-03-26 DIAGNOSIS — Z794 Long term (current) use of insulin: Secondary | ICD-10-CM | POA: Diagnosis not present

## 2024-03-26 DIAGNOSIS — N2581 Secondary hyperparathyroidism of renal origin: Secondary | ICD-10-CM | POA: Diagnosis not present

## 2024-03-26 DIAGNOSIS — E785 Hyperlipidemia, unspecified: Secondary | ICD-10-CM | POA: Diagnosis not present

## 2024-03-26 DIAGNOSIS — Z743 Need for continuous supervision: Secondary | ICD-10-CM | POA: Diagnosis not present

## 2024-03-26 DIAGNOSIS — Z89511 Acquired absence of right leg below knee: Secondary | ICD-10-CM | POA: Diagnosis not present

## 2024-03-26 DIAGNOSIS — G894 Chronic pain syndrome: Secondary | ICD-10-CM | POA: Diagnosis not present

## 2024-03-26 DIAGNOSIS — E1151 Type 2 diabetes mellitus with diabetic peripheral angiopathy without gangrene: Secondary | ICD-10-CM | POA: Diagnosis not present

## 2024-03-26 DIAGNOSIS — E1140 Type 2 diabetes mellitus with diabetic neuropathy, unspecified: Secondary | ICD-10-CM | POA: Diagnosis not present

## 2024-03-26 DIAGNOSIS — L89896 Pressure-induced deep tissue damage of other site: Secondary | ICD-10-CM | POA: Diagnosis not present

## 2024-03-26 DIAGNOSIS — L89152 Pressure ulcer of sacral region, stage 2: Secondary | ICD-10-CM | POA: Diagnosis not present

## 2024-03-26 DIAGNOSIS — Z6832 Body mass index (BMI) 32.0-32.9, adult: Secondary | ICD-10-CM | POA: Diagnosis not present

## 2024-03-26 DIAGNOSIS — L89322 Pressure ulcer of left buttock, stage 2: Secondary | ICD-10-CM | POA: Diagnosis not present

## 2024-03-26 LAB — CBC
HCT: 31.9 % — ABNORMAL LOW (ref 39.0–52.0)
Hemoglobin: 10.2 g/dL — ABNORMAL LOW (ref 13.0–17.0)
MCH: 29.5 pg (ref 26.0–34.0)
MCHC: 32 g/dL (ref 30.0–36.0)
MCV: 92.2 fL (ref 80.0–100.0)
Platelets: 141 K/uL — ABNORMAL LOW (ref 150–400)
RBC: 3.46 MIL/uL — ABNORMAL LOW (ref 4.22–5.81)
RDW: 15.9 % — ABNORMAL HIGH (ref 11.5–15.5)
WBC: 5.3 K/uL (ref 4.0–10.5)
nRBC: 0 % (ref 0.0–0.2)

## 2024-03-26 LAB — RENAL FUNCTION PANEL
Albumin: 2.2 g/dL — ABNORMAL LOW (ref 3.5–5.0)
Anion gap: 14 (ref 5–15)
BUN: 39 mg/dL — ABNORMAL HIGH (ref 8–23)
CO2: 23 mmol/L (ref 22–32)
Calcium: 7.7 mg/dL — ABNORMAL LOW (ref 8.9–10.3)
Chloride: 97 mmol/L — ABNORMAL LOW (ref 98–111)
Creatinine, Ser: 6.48 mg/dL — ABNORMAL HIGH (ref 0.61–1.24)
GFR, Estimated: 9 mL/min — ABNORMAL LOW (ref >60)
Glucose, Bld: 188 mg/dL — ABNORMAL HIGH (ref 70–99)
Phosphorus: 6.1 mg/dL — ABNORMAL HIGH (ref 2.5–4.6)
Potassium: 3.6 mmol/L (ref 3.5–5.1)
Sodium: 134 mmol/L — ABNORMAL LOW (ref 135–145)

## 2024-03-26 LAB — HEPATITIS B SURFACE ANTIBODY, QUANTITATIVE: Hep B S AB Quant (Post): 9.7 m[IU]/mL — ABNORMAL LOW

## 2024-03-26 MED ORDER — FIDAXOMICIN 200 MG PO TABS
200.0000 mg | ORAL_TABLET | Freq: Two times a day (BID) | ORAL | Status: DC
  Administered 2024-03-26 – 2024-03-28 (×4): 200 mg via ORAL
  Filled 2024-03-26 (×5): qty 1

## 2024-03-26 MED ORDER — FIDAXOMICIN 200 MG PO TABS: 200.0000 mg | ORAL_TABLET | ORAL | Status: DC

## 2024-03-26 MED ORDER — OXYCODONE-ACETAMINOPHEN 5-325 MG PO TABS
1.0000 | ORAL_TABLET | Freq: Four times a day (QID) | ORAL | Status: DC | PRN
  Administered 2024-03-26 – 2024-03-28 (×4): 1 via ORAL
  Filled 2024-03-26 (×4): qty 1

## 2024-03-26 NOTE — Progress Notes (Signed)
   03/26/24 1731  Vitals  Temp 97.8 F (36.6 C)  Pulse Rate 80  Resp 19  BP (!) 156/56  SpO2 99 %  O2 Device Room Air  Oxygen  Therapy  Patient Activity (if Appropriate) In bed  Post Treatment  Dialyzer Clearance Lightly streaked  Liters Processed 84  Fluid Removed (mL) 5000 mL  Tolerated HD Treatment Yes  AVG/AVF Arterial Site Held (minutes) 10 minutes  AVG/AVF Venous Site Held (minutes) 10 minutes   Pt tolerated tx well, alert and stable, uf goal met, hemostasisx2

## 2024-03-26 NOTE — Progress Notes (Addendum)
 PROGRESS NOTE    [name]  FMW:[id number] DOB: [birth year] DOA: 03/24/2024 PCP: Montey Lot, PA-C  Subjective: Pt seen and examined. Pt thinks his diarrhea slowing down. Only 3 episodes since last night. Volume of stool is less. No nausea. Pt is hungry.  Going to HD later this evening.  Discussed with pharmacy. Don't think pt has a UTI. Going to stop IV rocephin . Continue dificid  for c.diff.   Hospital Course: HPI: [name] is a 68 y.o. male with medical history significant for ESRD on HD TTS, C-diff infection, sacral decubitus ulcer, type 2 diabetes, hyperlipidemia, hypertension, COPD, peripheral artery disease status post bilateral below the knee amputation, paroxysmal A-fib on Eliquis , coronary artery disease status post PCI with drug-eluting stent placed on 11/24/2023 on Plavix , who presents to the ER due to persistent nausea for the past few days, watery stool for weeks, and a sacral decubitus wound.   In the ER, UA positive for pyuria.  C. difficile positive.  Started on Rocephin  and Fidaxomicin .  Wound care specialist consulted for the sacral decubitus ulcer.  TRH, hospitalist service, was asked to admit.   ED Course: Temperature 99.0.  BP 109/50, pulse 72, respiratory 18, saturation 97% on room air.  Lab studies notable for WBC 9.3, platelet count 147, hemoglobin 9.5.  Significant Events: Admitted 03/24/2024 for sacral decub ulcer   Admission Labs: WBC 9.3, HgB 9.5, plt 147 Na 136, K 3.4, CO2 of 27, BUN 25, Scr 4.93, glu 144 T prot 5.5, alb 2.6, AST 18, ALT 11 UA negative nitrite, large LE, WBC >50, few bacteria C. Diff antigen positive, Toxin positive  Admission Imaging Studies:   Significant Labs:   Significant Imaging Studies:   Antibiotic Therapy: Anti-infectives (From admission, onward)    Start     Dose/Rate Route Frequency Ordered Stop   03/25/24 0700  cefTRIAXone  (ROCEPHIN ) 1 g in sodium chloride  0.9 % 100 mL IVPB        1 g 200 mL/hr over 30  Minutes Intravenous Daily 03/25/24 0624     03/25/24 0030  fidaxomicin  (DIFICID ) tablet 200 mg        200 mg Oral 2 times daily 03/25/24 0019 04/03/24 2159       Procedures:   Consultants: nephrology    Assessment and Plan: * Decubitus ulcer of sacral region, stage 2 (HCC) 03-25-2024 present on admission. Seen by wound care.          03-26-2024 continue with wound care recs Noted to have stage 2 pressure injury to buttock in March 2025.  Has had recent episode of watery stool and skin breakdown is consistent with pressure and moisture.  Staff in room and assist with turning patient to assess wound.  Has deep tissue injury to bilateral posterior thighs, consistent with prolonged time on bedpan.  Present on admission.  Intact at this time.  Wound type: moisture and pressure Deep tissue injury  Pressure Injury POA: Yes Measurement: Bilateral posterior thighs:  Curved maroon discoloration that matches bedpan pattern.  8 cm x 1 cm intact Coccyx:  4 cmx 3 cm x 0.2 cm nonintact , consistent with stage 2 pressure injury Bilateral buttocks with partial thickness tissue loss in gluteal fold.   Posterior scrotal skin breakdown, scattered weeping nonintact areas.  Wound bed: red and moist Drainage (amount, consistency, odor) minimal serosanguinous  no odor Periwound: Dry skin, frequent exposure to loose stool and urine.  Dressing procedure/placement/frequency: Cleanse buttock/sacral and perineal skin with soap and water  and  pat dry. Sacral foam to coccyx to pad and protect.  Gerhardts to bilateral gluteal folds, scrotum and perineal breakdown.(incontinence associated dermatitis)   Patient with bilateral BKA, limited bed mobility.  Some scabbing to distal end of amputation sites, bilaterally.  Consistent with trauma from transfers, suture lines are intact and healed.  Recommend Low air loss mattress.   Recurrent Clostridioides difficile diarrhea 03-25-2024 continue with Bid dificid . Pt  states diarrhea is improving. Nurse tech verifies that diarrhea consistency is getting thicker.  03-26-2024 pt states diarrhea is improving but not gone. Continue with dificid  bid.  Acute cystitis without hematuria 03-25-2024 pt still urinates despite ESRD on HD. On IV rocephin . Limit to 3 days of treatment. No urine cx sent.  03-26-2024 Discussed with pharmacy. Don't think pt has a UTI. Going to stop IV rocephin .   Obesity, Class I, BMI 30-34.9 Body mass index is 32.1 kg/m.   ESRD (end stage renal disease) on dialysis [facility]) 03-25-2024 nephrology consulted for routine HD on T, Th, Sat  03-26-2024 stable. For HD today.  Paroxysmal atrial fibrillation (HCC) 03-25-2024 stable. On Eliquis .  03-26-2024 stable.   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 Pt lives at [facility]. He plans on returning there.  03-26-2024 stable.   Anemia due to chronic kidney disease 03-25-2024 stable.  03-26-2024 stable.   COPD (chronic obstructive pulmonary disease) (HCC) 03-25-2024 stable. On RA  03-26-2024 stable. On RA  Type 2 diabetes mellitus with diabetic neuropathy (HCC) 03-25-2024 on SSI  03-26-2024 stable. CBG ranges acceptable.  GERD (gastroesophageal reflux disease) 03-25-2024 stable. Holding pepcid  and PPI due to recurrent C. Diff.  03-26-2024 stable.    DVT prophylaxis:  apixaban  (ELIQUIS ) tablet 5 mg     Code Status: Full Code Family Communication: no family at bedside. He is decisional. Disposition Plan: SNF Reason for continuing need for hospitalization: getting HD today. Continue C. Diff treatment with dificid .  Objective: Vitals:   03/26/24 1400 03/26/24 1430 03/26/24 1500 03/26/24 1530  BP: (!) 203/86 (!) 189/73 (!) 201/77 (!) 185/69  Pulse: 73 73 72 70  Resp: (!) 21 (!) 22 17 17   Temp:      TempSrc:      SpO2: 100% 100% 99% 99%  Weight:      Height:        Intake/Output Summary (Last 24 hours) at  03/26/2024 1533 Last data filed at 03/26/2024 0403 Gross per 24 hour  Intake 360 ml  Output 200 ml  Net 160 ml   Filed Weights   03/24/24 1722  Weight: 113.4 kg    Examination:  Physical Exam Vitals and nursing note reviewed.  Constitutional:      General: He is not in acute distress.    Appearance: He is obese. He is not toxic-appearing or diaphoretic.  HENT:     Head: Normocephalic and atraumatic.     Nose: Nose normal.  Cardiovascular:     Rate and Rhythm: Normal rate and regular rhythm.  Pulmonary:     Effort: Pulmonary effort is normal.     Breath sounds: Normal breath sounds.  Abdominal:     General: Abdomen is protuberant. Bowel sounds are normal. There is no distension.     Palpations: Abdomen is soft.     Tenderness: There is no abdominal tenderness.  Musculoskeletal:     Comments: Bilateral BKA  Skin:    General: Skin is warm and dry.     Capillary Refill: Capillary refill takes  less than 2 seconds.  Neurological:     Mental Status: He is alert and oriented to person, place, and time.     Data Reviewed: I have personally reviewed following labs and imaging studies  CBC: Recent Labs  Lab 03/24/24 1807 03/26/24 0805  WBC 9.3 5.3  NEUTROABS 7.6  --   HGB 9.5* 10.2*  HCT 30.1* 31.9*  MCV 93.5 92.2  PLT 147* 141*   Basic Metabolic Panel: Recent Labs  Lab 03/24/24 1807 03/25/24 1100 03/26/24 0805  NA 136 137 134*  K 3.4* 3.7 3.6  CL 96* 97* 97*  CO2 27 24 23   GLUCOSE 144* 127* 188*  BUN 25* 32* 39*  CREATININE 4.93* 5.75* 6.48*  CALCIUM  7.3* 8.0* 7.7*  PHOS  --  5.3* 6.1*   GFR: Estimated Creatinine Clearance: 14.4 mL/min (A) (by C-G formula based on SCr of 6.48 mg/dL (H)). Liver Function Tests: Recent Labs  Lab 03/24/24 1807 03/25/24 1100 03/26/24 0805  AST 18  --   --   ALT 11  --   --   ALKPHOS 95  --   --   BILITOT 0.9  --   --   PROT 5.5*  --   --   ALBUMIN  2.6* 2.7* 2.2*   BNP (last 3 results) Recent Labs     02/12/24 1638  BNP 387.9*   CBG: Recent Labs  Lab 03/24/24 1832  GLUCAP 133*   Recent Results (from the past 240 hours)  C Difficile Quick Screen w PCR reflex     Status: Abnormal   Collection Time: 03/24/24  9:21 PM   Specimen: STOOL  Result Value Ref Range Status   C Diff antigen POSITIVE (A) NEGATIVE Final   C Diff toxin POSITIVE (A) NEGATIVE Final   C Diff interpretation Toxin producing C. difficile detected.  Final    Comment: RESULT CALLED TO, READ BACK BY AND VERIFIED WITH: B NORMENT RN 03/25/2024 @ 0017 BY AB Performed at [facility], [address] [place], [place] [address]     Scheduled Meds:  apixaban   5 mg Oral BID   atorvastatin   80 mg Oral QHS   calcium  acetate  1,334 mg Oral TID WC   clopidogrel   75 mg Oral Q breakfast   fidaxomicin   200 mg Oral BID   Followed by   NOREEN ON 03/30/2024] fidaxomicin   200 mg Oral QODAY   [name]'s butt cream   Topical BID   Continuous Infusions:   LOS: 0 days   Time spent: 55 minutes  Camellia Door, DO  [facility]  03/26/2024, 3:33 PM

## 2024-03-26 NOTE — Plan of Care (Signed)
   Problem: Education: Goal: Knowledge of General Education information will improve Description: Including pain rating scale, medication(s)/side effects and non-pharmacologic comfort measures Outcome: Progressing   Problem: Nutrition: Goal: Adequate nutrition will be maintained Outcome: Progressing   Problem: Elimination: Goal: Will not experience complications related to bowel motility Outcome: Progressing

## 2024-03-26 NOTE — Progress Notes (Addendum)
 [name] Progress Note   Subjective:  Seen in room - diarrhea slightly improved. No CP/dyspnea. For HD today.    Objective Vitals:   03/25/24 2001 03/25/24 2357 03/26/24 0344 03/26/24 0740  BP: (!) 169/68 (!) 159/69 (!) 142/58 (!) 174/75  Pulse: 78 72 79 71  Resp: 20 20 20 18   Temp: 98.7 F (37.1 C) 98.3 F (36.8 C) 98.6 F (37 C) 98.3 F (36.8 C)  TempSrc:   Oral Oral  SpO2: 98% 100% 97% 100%  Weight:      Height:       Physical Exam General: Well appearing man, NAD. RA Heart: RRR; no murmur Lungs: CTA anteriorly Abdomen: soft Extremities: B BKA, 2+ B stump edema Dialysis Access: LUE AVF +t/b  Additional Objective Labs: Basic Metabolic Panel: Recent Labs  Lab 03/24/24 1807 03/25/24 1100  NA 136 137  K 3.4* 3.7  CL 96* 97*  CO2 27 24  GLUCOSE 144* 127*  BUN 25* 32*  CREATININE 4.93* 5.75*  CALCIUM  7.3* 8.0*  PHOS  --  5.3*   Liver Function Tests: Recent Labs  Lab 03/24/24 1807 03/25/24 1100  AST 18  --   ALT 11  --   ALKPHOS 95  --   BILITOT 0.9  --   PROT 5.5*  --   ALBUMIN  2.6* 2.7*   CBC: Recent Labs  Lab 03/24/24 1807 03/26/24 0805  WBC 9.3 5.3  NEUTROABS 7.6  --   HGB 9.5* 10.2*  HCT 30.1* 31.9*  MCV 93.5 92.2  PLT 147* 141*   Medications:  cefTRIAXone  (ROCEPHIN )  IV 1 g (03/25/24 0704)    apixaban   5 mg Oral BID   atorvastatin   80 mg Oral QHS   calcium  acetate  1,334 mg Oral TID WC   clopidogrel   75 mg Oral Q breakfast   fidaxomicin   200 mg Oral BID   [name]'s butt cream   Topical BID    Dialysis Orders TTS - [facility] -> full HD 7/15, but left at 119.3kg (way up), had missed HD on 7/12 and the week before? Last HD 7/5 4:15hr, 450/800, EDW 108kg, 2K/2Ca bath, AVF, UFP #2, no heparin  - Mircera 200mcg IV q 2 weeks (last 7/5) - Venofer  50mg  IV weekly - Hectoral 2mcg IV q HD   Assessment/Plan:  C. Diff colitis: On Dificid  BID, slight improvement - follow.  ?UTI: Cx pending, on Ceftriaxone .  ESRD:  Continue  HD on TTS schedule - for HD today, discussed compliance with outpatient HD.  Hypertension/volume: BP high, does have BLE edema - large UFG as tolerated.  Anemia: Hgb 10.2 - resume ESA as outpatient  Metabolic bone disease: Ca/Phos ok - continue home meds.   [name], PA-C 03/26/2024, 9:40 AM  [facility]   Seen and examined independently.  Agree with note and exam as documented above by physician extender and as noted here.  General adult male in bed in no acute distress HEENT normocephalic atraumatic extraocular movements intact sclera anicteric Neck supple trachea midline Lungs clear to auscultation bilaterally normal work of breathing at rest  Heart S1S2 no rub Abdomen soft nontender nondistended Extremities 1+ edema of bilateral residual limbs  Psych normal mood and affect Neuro alert and oriented x 3 provides hx and follows commands  Access LUE AVF with bruit and thrill; (states sticking away from healing lesion on his AVF)   # End-stage renal disease HD per TTS schedule  # C. difficile colitis Most recently with diarrhea.  Regimen per primary team. On fidaxomicin  Note He does still appear overloaded on exam   # Hypertension - Optimize volume status with HD   # Metabolic bone disease - Hyperphosphatemia resume binders - He is reported above is having colon surgery and has diarrhea preventing him from coming to HD.  I imagine this has been investigated outpatient but for right now would recommend changing to PhosLo  inpatient and then discussing best outpatient regimen for him  Disposition per primary team   [name], MD 03/26/2024  5:37 PM

## 2024-03-27 DIAGNOSIS — G894 Chronic pain syndrome: Secondary | ICD-10-CM

## 2024-03-27 DIAGNOSIS — L89152 Pressure ulcer of sacral region, stage 2: Secondary | ICD-10-CM | POA: Diagnosis not present

## 2024-03-27 DIAGNOSIS — I480 Paroxysmal atrial fibrillation: Secondary | ICD-10-CM

## 2024-03-27 DIAGNOSIS — A0471 Enterocolitis due to Clostridium difficile, recurrent: Secondary | ICD-10-CM | POA: Diagnosis not present

## 2024-03-27 DIAGNOSIS — N186 End stage renal disease: Secondary | ICD-10-CM | POA: Diagnosis not present

## 2024-03-27 DIAGNOSIS — E876 Hypokalemia: Secondary | ICD-10-CM

## 2024-03-27 LAB — CBC WITH DIFFERENTIAL/PLATELET
Abs Immature Granulocytes: 0.01 K/uL (ref 0.00–0.07)
Basophils Absolute: 0 K/uL (ref 0.0–0.1)
Basophils Relative: 0 %
Eosinophils Absolute: 0.4 K/uL (ref 0.0–0.5)
Eosinophils Relative: 9 %
HCT: 30.7 % — ABNORMAL LOW (ref 39.0–52.0)
Hemoglobin: 9.6 g/dL — ABNORMAL LOW (ref 13.0–17.0)
Immature Granulocytes: 0 %
Lymphocytes Relative: 23 %
Lymphs Abs: 1 K/uL (ref 0.7–4.0)
MCH: 28.5 pg (ref 26.0–34.0)
MCHC: 31.3 g/dL (ref 30.0–36.0)
MCV: 91.1 fL (ref 80.0–100.0)
Monocytes Absolute: 0.6 K/uL (ref 0.1–1.0)
Monocytes Relative: 12 %
Neutro Abs: 2.5 K/uL (ref 1.7–7.7)
Neutrophils Relative %: 56 %
Platelets: 171 K/uL (ref 150–400)
RBC: 3.37 MIL/uL — ABNORMAL LOW (ref 4.22–5.81)
RDW: 15.8 % — ABNORMAL HIGH (ref 11.5–15.5)
WBC: 4.5 K/uL (ref 4.0–10.5)
nRBC: 0 % (ref 0.0–0.2)

## 2024-03-27 LAB — COMPREHENSIVE METABOLIC PANEL WITH GFR
ALT: 14 U/L (ref 0–44)
AST: 21 U/L (ref 15–41)
Albumin: 2.2 g/dL — ABNORMAL LOW (ref 3.5–5.0)
Alkaline Phosphatase: 82 U/L (ref 38–126)
Anion gap: 12 (ref 5–15)
BUN: 23 mg/dL (ref 8–23)
CO2: 26 mmol/L (ref 22–32)
Calcium: 7.7 mg/dL — ABNORMAL LOW (ref 8.9–10.3)
Chloride: 98 mmol/L (ref 98–111)
Creatinine, Ser: 4.52 mg/dL — ABNORMAL HIGH (ref 0.61–1.24)
GFR, Estimated: 13 mL/min — ABNORMAL LOW (ref >60)
Glucose, Bld: 155 mg/dL — ABNORMAL HIGH (ref 70–99)
Potassium: 3.3 mmol/L — ABNORMAL LOW (ref 3.5–5.1)
Sodium: 136 mmol/L (ref 135–145)
Total Bilirubin: 0.4 mg/dL (ref 0.0–1.2)
Total Protein: 5.1 g/dL — ABNORMAL LOW (ref 6.5–8.1)

## 2024-03-27 MED ORDER — SEVELAMER CARBONATE 800 MG PO TABS
1600.0000 mg | ORAL_TABLET | Freq: Three times a day (TID) | ORAL | Status: DC
  Administered 2024-03-27 – 2024-03-28 (×4): 1600 mg via ORAL
  Filled 2024-03-27 (×4): qty 2

## 2024-03-27 MED ORDER — HYDRALAZINE HCL 25 MG PO TABS
25.0000 mg | ORAL_TABLET | Freq: Four times a day (QID) | ORAL | Status: DC | PRN
  Administered 2024-03-27: 25 mg via ORAL
  Filled 2024-03-27: qty 1

## 2024-03-27 MED ORDER — OXYCODONE-ACETAMINOPHEN 5-325 MG PO TABS: 1.0000 | ORAL_TABLET | Freq: Three times a day (TID) | ORAL | 0 refills | Status: DC | PRN

## 2024-03-27 MED ORDER — FENTANYL 50 MCG/HR TD PT72: 1.0000 | MEDICATED_PATCH | TRANSDERMAL | 0 refills | Status: DC

## 2024-03-27 MED ORDER — FIDAXOMICIN 200 MG PO TABS: ORAL_TABLET | ORAL | Status: AC

## 2024-03-27 MED ORDER — ISOSORBIDE MONONITRATE ER 60 MG PO TB24
60.0000 mg | ORAL_TABLET | Freq: Every day | ORAL | Status: DC
  Administered 2024-03-27 – 2024-03-28 (×2): 60 mg via ORAL
  Filled 2024-03-27 (×2): qty 1

## 2024-03-27 MED ORDER — ROPINIROLE HCL 1 MG PO TABS
2.0000 mg | ORAL_TABLET | Freq: Every day | ORAL | Status: DC
  Administered 2024-03-27: 2 mg via ORAL
  Filled 2024-03-27: qty 2

## 2024-03-27 MED ORDER — CHLORHEXIDINE GLUCONATE CLOTH 2 % EX PADS
6.0000 | MEDICATED_PAD | Freq: Every day | CUTANEOUS | Status: DC
  Administered 2024-03-28: 6 via TOPICAL

## 2024-03-27 MED ORDER — FENTANYL 50 MCG/HR TD PT72
1.0000 | MEDICATED_PATCH | TRANSDERMAL | Status: DC
  Administered 2024-03-27: 1 via TRANSDERMAL
  Filled 2024-03-27: qty 1

## 2024-03-27 MED ORDER — TAMSULOSIN HCL 0.4 MG PO CAPS
0.4000 mg | ORAL_CAPSULE | Freq: Every day | ORAL | Status: DC
Start: 2024-03-27 — End: 2024-03-28
  Administered 2024-03-27 – 2024-03-28 (×2): 0.4 mg via ORAL
  Filled 2024-03-27 (×2): qty 1

## 2024-03-27 MED ORDER — GERHARDT'S BUTT CREAM: 1.0000 | TOPICAL_CREAM | Freq: Two times a day (BID) | CUTANEOUS | Status: AC

## 2024-03-27 MED ORDER — GABAPENTIN 100 MG PO CAPS
100.0000 mg | ORAL_CAPSULE | Freq: Three times a day (TID) | ORAL | Status: DC
  Administered 2024-03-27 – 2024-03-28 (×5): 100 mg via ORAL
  Filled 2024-03-27 (×5): qty 1

## 2024-03-27 MED ORDER — AMLODIPINE BESYLATE 5 MG PO TABS
5.0000 mg | ORAL_TABLET | Freq: Every day | ORAL | Status: DC
  Administered 2024-03-27 – 2024-03-28 (×2): 5 mg via ORAL
  Filled 2024-03-27 (×2): qty 1

## 2024-03-27 MED ORDER — POTASSIUM CHLORIDE CRYS ER 20 MEQ PO TBCR
40.0000 meq | EXTENDED_RELEASE_TABLET | Freq: Once | ORAL | Status: AC
  Administered 2024-03-27: 40 meq via ORAL
  Filled 2024-03-27: qty 2

## 2024-03-27 NOTE — TOC Progression Note (Addendum)
 Transition of Care [facility]) - Progression Note    Patient Details  Name: MARKEVIUS TROMBETTA MRN: [id number] Date of Birth: 08-27-1956  Transition of Care [facility]) CM/SW Contact  Shanyn Preisler A [place], LCSW Phone Number: 03/27/2024, 1:00 PM  Clinical Narrative:     Update 1503 TOC team notified pt's wife with pt's consent, completed appeal of DC with [employer]. CSW notified [place] of delay of DC and weekend CSW to reach out facility regarding update dc date.   CSW contacted pt's wife [facility] regarding discharge. She said that she had concerns because medications at [facility] and his HD appointments. She said that she would prefer a discharge tomorrow versus today and would appeal if MD did not change discharge date.   CSW followed up with pt at bedside and informed him that he can decide to DC today despite his wife's concerns. He said he wanted to talk with his wife and make a decision to appeal.   MD notified.    TOC will continue to follow.        Expected Discharge Plan and Services         Expected Discharge Date: 03/27/24                                     Social Determinants of Health (SDOH) Interventions SDOH Screenings   Food Insecurity: No Food Insecurity (03/25/2024)  Housing: Low Risk  (03/25/2024)  Transportation Needs: No Transportation Needs (03/25/2024)  Utilities: Not At Risk (03/25/2024)  Social Connections: Moderately Isolated (03/25/2024)  Tobacco Use: High Risk (03/24/2024)    Readmission Risk Interventions    05/29/2023    4:41 PM  Readmission Risk Prevention Plan  Transportation Screening Complete  Medication Review (RN Care Manager) Complete  PCP or Specialist appointment within 3-5 days of discharge Complete  HRI or Home Care Consult Complete  SW Recovery Care/Counseling Consult Complete  Palliative Care Screening Not Applicable  Skilled Nursing Facility Complete

## 2024-03-27 NOTE — Progress Notes (Signed)
 D/C order noted. Contacted [facility] to be advised of pt's d/c order for today and that pt should resume care tomorrow if pt/wife do not appeal d/c (considering appeal per CSW notes).   Randine Mungo Renal Navigator [phone]

## 2024-03-27 NOTE — TOC Progression Note (Signed)
 Transition of Care [facility]) - Progression Note    Patient Details  Name: LOCHLIN EPPINGER MRN: [id number] Date of Birth: June 03, 1956  Transition [facility]) CM/SW Contact  Rosaline JONELLE Joe, RN Phone Number: 03/27/2024, 3:00 PM  Clinical Narrative:    CM spoke with [place], MSW and wife plans to call and appeal the discharge.  I met with the patient at the bedside and detailed notice of discharge and HINN 12 was provided to the patient.  I called and spoke with Claretta Deed, CMS with Inpatient Care Management and she will follow up and complete the appeal notification.        Expected Discharge Plan and Services         Expected Discharge Date: 03/27/24                                     Social Determinants of Health (SDOH) Interventions SDOH Screenings   Food Insecurity: No Food Insecurity (03/25/2024)  Housing: Low Risk  (03/25/2024)  Transportation Needs: No Transportation Needs (03/25/2024)  Utilities: Not At Risk (03/25/2024)  Social Connections: Moderately Isolated (03/25/2024)  Tobacco Use: High Risk (03/24/2024)    Readmission Risk Interventions    05/29/2023    4:41 PM  Readmission Risk Prevention Plan  Transportation Screening Complete  Medication Review (RN Care Manager) Complete  PCP or Specialist appointment within 3-5 days of discharge Complete  HRI or Home Care Consult Complete  SW Recovery Care/Counseling Consult Complete  Palliative Care Screening Not Applicable  Skilled Nursing Facility Complete

## 2024-03-27 NOTE — Progress Notes (Signed)
 PROGRESS NOTE    [name]  FMW:[id number] DOB: 06/10/1956 DOA: 03/24/2024 PCP: Montey Lot, PA-C  Subjective: Pt seen and examined. Stable. Having type 6(clay like) BM according to nursing documentation. No fevers. Had HD yesterday. Discussed with pharmacy. Will complete 10 days of dificid  and then 20 days taper(every other day dosing) at discharge. Stable for DC back to SNF.   Hospital Course: HPI: [name] is a 68 y.o. male with medical history significant for ESRD on HD TTS, C-diff infection, sacral decubitus ulcer, type 2 diabetes, hyperlipidemia, hypertension, COPD, peripheral artery disease status post bilateral below the knee amputation, paroxysmal A-fib on Eliquis , coronary artery disease status post PCI with drug-eluting stent placed on 11/24/2023 on Plavix , who presents to the ER due to persistent nausea for the past few days, watery stool for weeks, and a sacral decubitus wound.   In the ER, UA positive for pyuria.  C. difficile positive.  Started on Rocephin  and Fidaxomicin .  Wound care specialist consulted for the sacral decubitus ulcer.  TRH, hospitalist service, was asked to admit.   ED Course: Temperature 99.0.  BP 109/50, pulse 72, respiratory 18, saturation 97% on room air.  Lab studies notable for WBC 9.3, platelet count 147, hemoglobin 9.5.  Significant Events: Admitted 03/24/2024 for sacral decub ulcer   Admission Labs: WBC 9.3, HgB 9.5, plt 147 Na 136, K 3.4, CO2 of 27, BUN 25, Scr 4.93, glu 144 T prot 5.5, alb 2.6, AST 18, ALT 11 UA negative nitrite, large LE, WBC >50, few bacteria C. Diff antigen positive, Toxin positive  Admission Imaging Studies:   Significant Labs:   Significant Imaging Studies:   Antibiotic Therapy: Anti-infectives (From admission, onward)    Start     Dose/Rate Route Frequency Ordered Stop   03/25/24 0700  cefTRIAXone  (ROCEPHIN ) 1 g in sodium chloride  0.9 % 100 mL IVPB        1 g 200 mL/hr over 30 Minutes Intravenous  Daily 03/25/24 0624     03/25/24 0030  fidaxomicin  (DIFICID ) tablet 200 mg        200 mg Oral 2 times daily 03/25/24 0019 04/03/24 2159       Procedures:   Consultants: nephrology    Assessment and Plan: * Decubitus ulcer of sacral region, stage 2 (HCC) 03-25-2024 present on admission. Seen by wound care.          03-26-2024 continue with wound care recs Noted to have stage 2 pressure injury to buttock in March 2025.  Has had recent episode of watery stool and skin breakdown is consistent with pressure and moisture.  Staff in room and assist with turning patient to assess wound.  Has deep tissue injury to bilateral posterior thighs, consistent with prolonged time on bedpan.  Present on admission.  Intact at this time.  Wound type: moisture and pressure Deep tissue injury  Pressure Injury POA: Yes Measurement: Bilateral posterior thighs:  Curved maroon discoloration that matches bedpan pattern.  8 cm x 1 cm intact Coccyx:  4 cmx 3 cm x 0.2 cm nonintact , consistent with stage 2 pressure injury Bilateral buttocks with partial thickness tissue loss in gluteal fold.   Posterior scrotal skin breakdown, scattered weeping nonintact areas.  Wound bed: red and moist Drainage (amount, consistency, odor) minimal serosanguinous  no odor Periwound: Dry skin, frequent exposure to loose stool and urine.  Dressing procedure/placement/frequency: Cleanse buttock/sacral and perineal skin with soap and water  and pat dry. Sacral foam to coccyx to pad  and protect.  Gerhardts to bilateral gluteal folds, scrotum and perineal breakdown.(incontinence associated dermatitis)   Patient with bilateral BKA, limited bed mobility.  Some scabbing to distal end of amputation sites, bilaterally.  Consistent with trauma from transfers, suture lines are intact and healed.  Recommend Low air loss mattress.   03-27-2024 DC to SNF with wound care orders.  Recurrent Clostridioides difficile diarrhea 03-25-2024  continue with Bid dificid . Pt states diarrhea is improving. Nurse tech verifies that diarrhea consistency is getting thicker.  03-26-2024 pt states diarrhea is improving but not gone. Continue with dificid  bid.  03-27-2024 Having type 6(clay like) BM according to nursing documentation. No fevers. Had HD yesterday. Discussed with pharmacy. Will complete 10 days of dificid  and then 20 days taper(every other day dosing) at discharge. Stable for DC back to SNF.   Acute cystitis without hematuria-resolved as of 03/27/2024 03-25-2024 pt still urinates despite ESRD on HD. On IV rocephin . Limit to 3 days of treatment. No urine cx sent.  03-26-2024 Discussed with pharmacy. Don't think pt has a UTI. Going to stop IV rocephin .   03-27-2024 resolved.  Obesity, Class I, BMI 30-34.9 Body mass index is 32.1 kg/m.   ESRD (end stage renal disease) on dialysis [facility]) 03-25-2024 nephrology consulted for routine HD on T, Th, Sat  03-26-2024 stable. For HD today.  03-27-2024 stable. Continue routine HD on T, Th, Sat  Paroxysmal atrial fibrillation (HCC) 03-25-2024 stable. On Eliquis .  03-26-2024 stable.   03-27-2024 stable. On Eliquis .  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 Pt lives at [facility]. He plans on returning there.  03-26-2024 stable.   03-27-2024 stable  Anemia due to chronic kidney disease 03-25-2024 stable.  03-26-2024 stable.   03-27-2024 stable. Discharge HgB 9.6 g/dl  COPD (chronic obstructive pulmonary disease) (HCC) 03-25-2024 stable. On RA  03-26-2024 stable. On RA  03-27-2024 stable. On RA  Chronic pain 03-27-2024 stable. Continue fentanyl  patch 50 mcg/hr q3d and prn oxycodone .  Type 2 diabetes mellitus with diabetic neuropathy (HCC) 03-25-2024 on SSI  03-26-2024 stable. CBG ranges acceptable.  03-27-2024 stable  GERD (gastroesophageal reflux disease) 03-25-2024 stable. Holding pepcid  and PPI due to  recurrent C. Diff.  03-26-2024 stable.   03-27-2024 stable   DVT prophylaxis:  apixaban  (ELIQUIS ) tablet 5 mg     Code Status: Full Code Family Communication: no family at bedside. Pt is decisional. Disposition Plan: SNF Reason for continuing need for hospitalization: stable for DC back to SNF  Objective: Vitals:   03/26/24 2033 03/27/24 0019 03/27/24 0334 03/27/24 0806  BP: (!) 192/74 (!) 178/69 (!) 190/82 (!) 199/77  Pulse: 83 76 75 78  Resp: 18 18 18 17   Temp: 98 F (36.7 C) 98 F (36.7 C) (!) 97.5 F (36.4 C) 98.3 F (36.8 C)  TempSrc: Oral Oral    SpO2: 100% 100% 100% 99%  Weight:      Height:        Intake/Output Summary (Last 24 hours) at 03/27/2024 0938 Last data filed at 03/27/2024 0032 Gross per 24 hour  Intake --  Output 5650 ml  Net -5650 ml   Filed Weights   03/24/24 1722  Weight: 113.4 kg    Examination:  Physical Exam Vitals and nursing note reviewed.  Constitutional:      Appearance: He is obese.  HENT:     Head: Normocephalic and atraumatic.     Nose: Nose normal.  Cardiovascular:     Rate and Rhythm:  Normal rate and regular rhythm.  Pulmonary:     Effort: Pulmonary effort is normal. No respiratory distress.     Breath sounds: Normal breath sounds. No wheezing.  Abdominal:     General: Bowel sounds are normal. There is no distension.     Palpations: Abdomen is soft.     Tenderness: There is no abdominal tenderness.  Musculoskeletal:     Comments: Bilateral BKA  Skin:    General: Skin is warm and dry.     Capillary Refill: Capillary refill takes less than 2 seconds.  Neurological:     Mental Status: He is alert and oriented to person, place, and time.     Data Reviewed: I have personally reviewed following labs and imaging studies  CBC: Recent Labs  Lab 03/24/24 1807 03/26/24 0805 03/27/24 0201  WBC 9.3 5.3 4.5  NEUTROABS 7.6  --  2.5  HGB 9.5* 10.2* 9.6*  HCT 30.1* 31.9* 30.7*  MCV 93.5 92.2 91.1  PLT 147* 141* 171    Basic Metabolic Panel: Recent Labs  Lab 03/24/24 1807 03/25/24 1100 03/26/24 0805 03/27/24 0201  NA 136 137 134* 136  K 3.4* 3.7 3.6 3.3*  CL 96* 97* 97* 98  CO2 27 24 23 26   GLUCOSE 144* 127* 188* 155*  BUN 25* 32* 39* 23  CREATININE 4.93* 5.75* 6.48* 4.52*  CALCIUM  7.3* 8.0* 7.7* 7.7*  PHOS  --  5.3* 6.1*  --    GFR: Estimated Creatinine Clearance: 20.7 mL/min (A) (by C-G formula based on SCr of 4.52 mg/dL (H)). Liver Function Tests: Recent Labs  Lab 03/24/24 1807 03/25/24 1100 03/26/24 0805 03/27/24 0201  AST 18  --   --  21  ALT 11  --   --  14  ALKPHOS 95  --   --  82  BILITOT 0.9  --   --  0.4  PROT 5.5*  --   --  5.1*  ALBUMIN  2.6* 2.7* 2.2* 2.2*   BNP (last 3 results) Recent Labs    02/12/24 1638  BNP 387.9*   CBG: Recent Labs  Lab 03/24/24 1832  GLUCAP 133*    Recent Results (from the past 240 hours)  C Difficile Quick Screen w PCR reflex     Status: Abnormal   Collection Time: 03/24/24  9:21 PM   Specimen: STOOL  Result Value Ref Range Status   C Diff antigen POSITIVE (A) NEGATIVE Final   C Diff toxin POSITIVE (A) NEGATIVE Final   C Diff interpretation Toxin producing C. difficile detected.  Final    Comment: RESULT CALLED TO, READ BACK BY AND VERIFIED WITH: B NORMENT RN 03/25/2024 @ 0017 BY AB Performed at [facility], [address] [place], [place] [address]     Scheduled Meds:  amLODipine   5 mg Oral Daily   apixaban   5 mg Oral BID   atorvastatin   80 mg Oral QHS   clopidogrel   75 mg Oral Q breakfast   fentaNYL   1 patch Transdermal Q3 days   fidaxomicin   200 mg Oral BID   Followed by   NOREEN ON 03/30/2024] fidaxomicin   200 mg Oral QODAY   gabapentin   100 mg Oral TID   [name]'s butt cream   Topical BID   isosorbide  mononitrate  60 mg Oral Daily   rOPINIRole   2 mg Oral QHS   sevelamer  carbonate  1,600 mg Oral TID WC   tamsulosin   0.4 mg Oral Daily   Continuous Infusions:  LOS: 1 day   Time spent: 55 minutes  Camellia Door, DO  [facility]  03/27/2024, 9:38 AM

## 2024-03-27 NOTE — TOC Progression Note (Signed)
 Transition of Care [facility]) - Progression Note    Patient Details  Name: [name] MRN: [id number] Date of Birth: 31-Mar-1956  Transition of Care [facility]) CM/SW Contact  Claretta Deed Phone Number: 03/27/2024, 3:55 PM  Clinical Narrative:     .[facility] Appeal) Detailed Notice of Discharge letter created and saved: Yes Everlina Stubberfield)   [facility]) ROI Document Created: Yes Everlina Vinie) [facility] ([employer]) appeal documents uploaded to [facility]) site: Yes Cherilyn Deed)

## 2024-03-27 NOTE — Discharge Planning (Addendum)
 [facility] Patient Discharge Orders - [facility] CLINIC: [facility]  Patient's name: [name] Admit/DC Dates: 03/24/2024 - 03/28/24  DISCHARGE DIAGNOSES: C. Diff colitis --> on Dificid  course ?UTI - finished abx course Volume overload Sacral ulcer (mild)  HD ORDER CHANGES: Heparin  change: no EDW Change: no Bath Change: YES New EDW: 3K/2.5Ca bath (presumed due to diarrhea)  ANEMIA MANAGEMENT: Aranesp : Given: no   ESA dose for discharge: same as prior  IV Iron dose at discharge: same as prior Transfusion: Given: no  BONE/MINERAL MEDICATIONS: Hectorol /Calcitriol change: no Sensipar/Parsabiv change: no  ACCESS INTERVENTION/CHANGE: no Details:  RECENT LABS: Recent Labs  Lab 03/26/24 0805 03/27/24 0201  HGB 10.2* 9.6*  NA 134* 136  K 3.6 3.3*  CALCIUM  7.7* 7.7*  PHOS 6.1*  --   ALBUMIN  2.2* 2.2*   IV ANTIBIOTICS: no Details:  OTHER ANTICOAGULATION:  Details: On Eliquis   OTHER/APPTS/LAB ORDERS: - D/c to SNF [facility]) - Check K weekly while on 3K bath - likely return to 2K once diarrhea has completely resolved and eating better  D/C Meds to be reconciled by nurse after every discharge.  Completed By: [name], PA-C [facility] Pager [phone]   Reviewed by: MD:______ RN_______

## 2024-03-27 NOTE — Discharge Summary (Addendum)
 [facility] Phys[facility] Discharge Summary   Patient name: [name]  Admit date:     03/24/2024  Discharge date: 03/28/2024  Attending Physician: LAURENCE, Malek Skog [[id number]]  Discharge Physician: Camellia LAURENCE   PCP: Montey Lot, PA-C  Admitted From: [facility] Disposition:  [facility]  Recommendations for Outpatient Follow-up:  Follow up with PCP in 1-2 weeks Routine hemodialysis on T, Th, Sat Please follow up on the following pending results:  Home Health:No Equipment/Devices: None    Discharge Condition:Stable CODE STATUS:FULL Diet recommendation: Renal/Diabetic Fluid Restriction: 1200 ml/day  Hospital Summary: HPI: [name] is a 68 y.o. male with medical history significant for ESRD on HD TTS, C-diff infection, sacral decubitus ulcer, type 2 diabetes, hyperlipidemia, hypertension, COPD, peripheral artery disease status post bilateral below the knee amputation, paroxysmal A-fib on Eliquis , coronary artery disease status post PCI with drug-eluting stent placed on 11/24/2023 on Plavix , who presents to the ER due to persistent nausea for the past few days, watery stool for weeks, and a sacral decubitus wound.   In the ER, UA positive for pyuria.  C. difficile positive.  Started on Rocephin  and Fidaxomicin .  Wound care specialist consulted for the sacral decubitus ulcer.  [facility], hospitalist service, was asked to admit.   ED Course: Temperature 99.0.  BP 109/50, pulse 72, respiratory 18, saturation 97% on room air.  Lab studies notable for WBC 9.3, platelet count 147, hemoglobin 9.5.  Significant Events: Admitted 03/24/2024 for sacral decub ulcer   Admission Labs: WBC 9.3, HgB 9.5, plt 147 Na 136, K 3.4, CO2 of 27, BUN 25, Scr 4.93, glu 144 T prot 5.5, alb 2.6, AST 18, ALT 11 UA negative nitrite, large LE, WBC >50, few bacteria C. Diff antigen positive, Toxin positive  Admission Imaging Studies:   Significant Labs:   Significant Imaging  Studies:   Antibiotic Therapy: Anti-infectives (From admission, onward)    Start     Dose/Rate Route Frequency Ordered Stop   03/25/24 0700  cefTRIAXone  (ROCEPHIN ) 1 g in sodium chloride  0.9 % 100 mL IVPB        1 g 200 mL/hr over 30 Minutes Intravenous Daily 03/25/24 0624     03/25/24 0030  fidaxomicin  (DIFICID ) tablet 200 mg        200 mg Oral 2 times daily 03/25/24 0019 04/03/24 2159       Procedures:   Consultants: nephrology   Hospital Course by Problem: * Decubitus ulcer of sacral region, stage 2 (HCC) 03-25-2024 present on admission. Seen by wound care.          03-26-2024 continue with wound care recs Noted to have stage 2 pressure injury to buttock in March 2025.  Has had recent episode of watery stool and skin breakdown is consistent with pressure and moisture.  Staff in room and assist with turning patient to assess wound.  Has deep tissue injury to bilateral posterior thighs, consistent with prolonged time on bedpan.  Present on admission.  Intact at this time.  Wound type: moisture and pressure Deep tissue injury  Pressure Injury POA: Yes Measurement: Bilateral posterior thighs:  Curved maroon discoloration that matches bedpan pattern.  8 cm x 1 cm intact Coccyx:  4 cmx 3 cm x 0.2 cm nonintact , consistent with stage 2 pressure injury Bilateral buttocks with partial thickness tissue loss in gluteal fold.   Posterior scrotal skin breakdown, scattered weeping nonintact areas.  Wound bed: red and moist Drainage (amount, consistency, odor) minimal serosanguinous  no odor Periwound: Dry skin, frequent exposure to loose stool and urine.  Dressing procedure/placement/frequency: Cleanse buttock/sacral and perineal skin with soap and water  and pat dry. Sacral foam to coccyx to pad and protect.  Gerhardts to bilateral gluteal folds, scrotum and perineal breakdown.(incontinence associated dermatitis)   Patient with bilateral BKA, limited bed mobility.  Some scabbing to  distal end of amputation sites, bilaterally.  Consistent with trauma from transfers, suture lines are intact and healed.  Recommend Low air loss mattress.   03-27-2024 DC to SNF with wound care orders.  03-28-2024 stable for DC to SNF with wound care orders.  Non compliance with medical treatment 03-28-2024 According to nephrology notes from this AM., pt had [employer] pizza delivered to his room yesterday evening. That is definitely not on a renal/diabetic diet. Pt's subsequent BP have been elevated. Likely due to the large sodium load from the [employer] pizza.     Recurrent Clostridioides difficile diarrhea 03-25-2024 continue with Bid dificid . Pt states diarrhea is improving. Nurse tech verifies that diarrhea consistency is getting thicker.  03-26-2024 pt states diarrhea is improving but not gone. Continue with dificid  bid.  03-27-2024 Having type 6(clay like) BM according to nursing documentation. No fevers. Had HD yesterday. Discussed with pharmacy. Will complete 10 days of dificid  and then 20 days taper(every other day dosing) at discharge. Stable for DC back to SNF.   03-28-2024 Only 1 stool recorded yesterday. Type 6(mushy). Remains on Dificid . Will place on tapering dose at SNF when he is discharged.  Acute cystitis without hematuria-resolved as of 03/27/2024 03-25-2024 pt still urinates despite ESRD on HD. On IV rocephin . Limit to 3 days of treatment. No urine cx sent.  03-26-2024 Discussed with pharmacy. Don't think pt has a UTI. Going to stop IV rocephin .   03-27-2024 resolved.  Obesity, Class I, BMI 30-34.9 Body mass index is 32.1 kg/m.   ESRD (end stage renal disease) on dialysis [facility]) 03-25-2024 nephrology consulted for routine HD on T, Th, Sat  03-26-2024 stable. For HD today.  03-27-2024 stable. Continue routine HD on T, Th, Sat  03-28-2024 stable. For HD today. Discussed with nephrology.  Paroxysmal atrial fibrillation (HCC) 03-25-2024 stable. On  Eliquis .  03-26-2024 stable.   03-27-2024 stable. On Eliquis .  03-28-2024 stable.  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 Pt lives at [facility]. He plans on returning there.  03-26-2024 stable.   03-27-2024 stable  03-28-2024 stable. chronic  Anemia due to chronic kidney disease 03-25-2024 stable.  03-26-2024 stable.   03-27-2024 stable. Discharge HgB 9.6 g/dl  [id number] stable.  COPD (chronic obstructive pulmonary disease) (HCC) 03-25-2024 stable. On RA  03-26-2024 stable. On RA  03-27-2024 stable. On RA  03-28-2024 stable. On RA.  Chronic pain 03-27-2024 stable. Continue fentanyl  patch 50 mcg/hr q3d and prn oxycodone .  03-28-2024 stable. Replaced fentanyl  patch yesterday.  Type 2 diabetes mellitus with diabetic neuropathy (HCC) 03-25-2024 on SSI  03-26-2024 stable. CBG ranges acceptable.  03-27-2024 stable  03-28-2024 stable.  GERD (gastroesophageal reflux disease) 03-25-2024 stable. Holding pepcid  and PPI due to recurrent C. Diff.  03-26-2024 stable.   03-27-2024 stable  03-28-2024 stable.  Hypokalemia 03-27-2024 pt give 1 dose of po kcl prior to discharge.  03-28-2024 stable.    Discharge Diagnoses:  Principal Problem:   Decubitus ulcer of sacral region, stage 2 (HCC) Active Problems:   Recurrent Clostridioides difficile diarrhea   Non compliance with medical treatment   GERD (gastroesophageal reflux disease)  Type 2 diabetes mellitus with diabetic neuropathy (HCC)   Chronic pain   COPD (chronic obstructive pulmonary disease) (HCC)   Anemia due to chronic kidney disease   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)   Paroxysmal atrial fibrillation (HCC)   ESRD (end stage renal disease) on dialysis (HCC)   Obesity, Class I, BMI 30-34.9   Hypokalemia   Discharge Instructions  Discharge Instructions     Call MD for:  difficulty breathing, headache or  visual disturbances   Complete by: As directed    Call MD for:  extreme fatigue   Complete by: As directed    Call MD for:  hives   Complete by: As directed    Call MD for:  persistant dizziness or light-headedness   Complete by: As directed    Call MD for:  persistant nausea and vomiting   Complete by: As directed    Call MD for:  redness, tenderness, or signs of infection (pain, swelling, redness, odor or green/yellow discharge around incision site)   Complete by: As directed    Call MD for:  severe uncontrolled pain   Complete by: As directed    Call MD for:  temperature >100.4   Complete by: As directed    Diet - low sodium heart healthy   Complete by: As directed    Diet renal/carb modified with fluid restriction   Complete by: As directed    1200 ml/day fluid restriction   Discharge wound care:   Complete by: As directed    Wound care  Every shift      Comments: Cleanse buttock/sacral and perineal skin with soap and water  and pat dry. Sacral foam to coccyx to pad and protect.  Gerhardts to bilateral gluteal folds, scrotum and perineal breakdown twice daily and PRN soilage.   Face-to-face encounter (required for Medicare/Medicaid patients)   Complete by: As directed    I Lavanda Lesches certify that this patient is under my care and that I, or a nurse practitioner or physician's assistant working with me, had a face-to-face encounter that meets the physician face-to-face encounter requirements with this patient on 03/24/2024. The encounter with the patient was in whole, or in part for the following medical condition(s) which is the primary reason for home health care (List medical condition): Sacral decubitus ulcer in BL above knee amputee   The encounter with the patient was in whole, or in part, for the following medical condition, which is the primary reason for home health care: Wound management for sacral decubitus ulcer   I certify that, based on my findings, the following  services are medically necessary home health services: Nursing   Reason for Medically Necessary Home Health Services:  Skilled Nursing- Skilled Assessment/Observation Skilled Nursing- Complex Wound Care Skilled Nursing- Assessment and Observation of Wound Status     My clinical findings support the need for the above services: Unable to leave home safely without assistance and/or assistive device   Further, I certify that my clinical findings support that this patient is homebound due to:  Open/draining pressure/stasis ulcer Unable to leave home safely without assistance Can transfer bed to chair only     Home Health   Complete by: As directed    To provide the following care/treatments: RN   Increase activity slowly   Complete by: As directed       Allergies as of 03/28/2024       Reactions   Contrast Media [iodinated Contrast Media]  Shortness Of Breath, Other (See Comments)   Altered mental status   Ivp Dye [iodinated Contrast Media] Anaphylaxis, Shortness Of Breath, Other (See Comments)   Altered mental status    Latex Rash   A severe rash appears after the first 24 hours of being placed   Tape Rash, Other (See Comments)   Rash after 1 day of use Do not leave on longer then 3 days without changing        Medication List     STOP taking these medications    diphenoxylate-atropine  2.5-0.025 MG tablet Commonly known as: LOMOTIL       TAKE these medications    amLODipine  10 MG tablet Commonly known as: NORVASC  Take 5 mg by mouth daily.   apixaban  5 MG Tabs tablet Commonly known as: ELIQUIS  Take 1 tablet (5 mg total) by mouth 2 (two) times daily.   atorvastatin  80 MG tablet Commonly known as: LIPITOR  Take 1 tablet (80 mg total) by mouth at bedtime.   clopidogrel  75 MG tablet Commonly known as: PLAVIX  Take 1 tablet (75 mg total) by mouth daily with breakfast. What changed: when to take this   colchicine  0.6 MG tablet Take 0.5 tablets (0.3 mg total) by  mouth daily.   Dialyvite /Zinc  Tabs Take 1 tablet by mouth See admin instructions. Give 1 tablet by mouth 3 days a week prior to dialysis on Tuesday, Thursday, Saturday.   famotidine  20 MG tablet Commonly known as: PEPCID  Take 20 mg by mouth See admin instructions. Give 1 tablet (20mg ) by mouth every other morning.   fentaNYL  50 MCG/HR Commonly known as: DURAGESIC  Place 1 patch onto the skin every 3 (three) days.   fidaxomicin  200 MG Tabs tablet Commonly known as: DIFICID  Take 1 tablet (200 mg total) by mouth 2 (two) times daily for 7 days, THEN 1 tablet (200 mg total) every other day for 20 days. Start taking on: March 27, 2024   folic acid  1 MG tablet Commonly known as: FOLVITE  Take 1 mg by mouth daily.   gabapentin  100 MG capsule Commonly known as: NEURONTIN  Take 100 mg by mouth 3 (three) times daily.   [name]'s butt cream Crea Apply 1 Application topically 2 (two) times daily.   isosorbide  mononitrate 60 MG 24 hr tablet Commonly known as: IMDUR  Take 60 mg by mouth daily. Give 60mg  by mouth one time a day   lidocaine -prilocaine  cream Commonly known as: EMLA  Apply 1 application  topically See admin instructions. Apply topically 3 days a week prior to dialysis on Tuesday, Thursday, Saturday.   loratadine  10 MG tablet Commonly known as: CLARITIN  Take 10 mg by mouth daily.   mineral oil-hydrophilic petrolatum ointment Apply 1 Application topically every 8 (eight) hours as needed for irritation or dry skin.   nitroGLYCERIN  0.4 MG SL tablet Commonly known as: NITROSTAT  Place 0.4 mg under the tongue every 5 (five) minutes as needed for chest pain.   NovoLOG  FlexPen 100 UNIT/ML FlexPen Generic drug: insulin  aspart Inject 4-16 Units into the skin See admin instructions. Inject 4 units subcutaneously three times daily plus 0-12 units per sliding scale: 70 - 150 : 0 units 151-200 : 2 units 201-250 : 4 units 251-300 : 6 units 301-350 : 8 units 351-400 : 10  units 401-450 : 12 units > 400 : give 12 units and recheck in 1 hour - if still > 400, call MD/NP for further orders.   oxyCODONE -acetaminophen  5-325 MG tablet Commonly known as: PERCOCET /ROXICET Take 1 tablet by  mouth every 8 (eight) hours as needed for severe pain (pain score 7-10).   promethazine  12.5 MG suppository Commonly known as: PHENERGAN  Place 12.5 mg rectally daily as needed for nausea or vomiting.   rOPINIRole  2 MG tablet Commonly known as: REQUIP  Take 2 mg by mouth at bedtime.   sevelamer  carbonate 800 MG tablet Commonly known as: RENVELA  Take 2 tablets (1,600 mg total) by mouth 3 (three) times daily with meals.   tamsulosin  0.4 MG Caps capsule Commonly known as: FLOMAX  Take 0.4 mg by mouth daily.               Discharge Care Instructions  (From admission, onward)           Start     Ordered   03/27/24 0000  Discharge wound care:       Comments: Wound care  Every shift      Comments: Cleanse buttock/sacral and perineal skin with soap and water  and pat dry. Sacral foam to coccyx to pad and protect.  Gerhardts to bilateral gluteal folds, scrotum and perineal breakdown twice daily and PRN soilage.   03/27/24 0933            Contact information for follow-up providers     Montey Lot, PA-C.   Specialty: Physician Assistant Contact information: [address] [place] [address] [phone]              Contact information for after-discharge care     Destination     [facility] .   Service: Skilled Nursing Contact information: [address] [place]  [address] [phone]                    Allergies  Allergen Reactions   Contrast Media [Iodinated Contrast Media] Shortness Of Breath and Other (See Comments)    Altered mental status     Ivp Dye [Iodinated Contrast Media] Anaphylaxis, Shortness Of Breath and Other (See Comments)    Altered mental status     Latex Rash    A severe  rash appears after the first 24 hours of being placed   Tape Rash and Other (See Comments)    Rash after 1 day of use  Do not leave on longer then 3 days without changing    Discharge Exam: Vitals:   03/28/24 0914 03/28/24 0930  BP: (!) 136/59 110/77  Pulse:    Resp: 19 19  Temp:    SpO2: 100% 100%    Physical Exam Vitals and nursing note reviewed.  Constitutional:      Appearance: He is obese.  HENT:     Head: Normocephalic and atraumatic.     Nose: Nose normal.  Cardiovascular:     Rate and Rhythm: Normal rate and regular rhythm.  Pulmonary:     Effort: Pulmonary effort is normal.     Breath sounds: Normal breath sounds.  Abdominal:     General: Abdomen is protuberant. Bowel sounds are normal. There is no distension.     Palpations: Abdomen is soft.     Tenderness: There is no abdominal tenderness. There is no guarding or rebound.  Musculoskeletal:     Comments: Bilateral BKA  Skin:    Capillary Refill: Capillary refill takes less than 2 seconds.  Neurological:     General: No focal deficit present.     Mental Status: He is alert and oriented to person, place, and time.     The results of significant  diagnostics from this hospitalization (including imaging, microbiology, ancillary and laboratory) are listed below for reference.    Microbiology: Recent Results (from the past 240 hours)  C Difficile Quick Screen w PCR reflex     Status: Abnormal   Collection Time: 03/24/24  9:21 PM   Specimen: STOOL  Result Value Ref Range Status   C Diff antigen POSITIVE (A) NEGATIVE Final   C Diff toxin POSITIVE (A) NEGATIVE Final   C Diff interpretation Toxin producing C. difficile detected.  Final    Comment: RESULT CALLED TO, READ BACK BY AND VERIFIED WITH: B NORMENT RN 03/25/2024 @ 0017 BY AB Performed at [facility], [address] [place], [place] [address]      Labs: BNP (last 3 results) Recent Labs    02/12/24 1638  BNP 387.9*   Basic Metabolic  Panel: Recent Labs  Lab 03/24/24 1807 03/25/24 1100 03/26/24 0805 03/27/24 0201 03/28/24 0716  NA 136 137 134* 136 136  K 3.4* 3.7 3.6 3.3* 3.9  CL 96* 97* 97* 98 95*  CO2 27 24 23 26 25   GLUCOSE 144* 127* 188* 155* 153*  BUN 25* 32* 39* 23 30*  CREATININE 4.93* 5.75* 6.48* 4.52* 5.86*  CALCIUM  7.3* 8.0* 7.7* 7.7* 8.0*  PHOS  --  5.3* 6.1*  --  5.1*   Liver Function Tests: Recent Labs  Lab 03/24/24 1807 03/25/24 1100 03/26/24 0805 03/27/24 0201 03/28/24 0716  AST 18  --   --  21  --   ALT 11  --   --  14  --   ALKPHOS 95  --   --  82  --   BILITOT 0.9  --   --  0.4  --   PROT 5.5*  --   --  5.1*  --   ALBUMIN  2.6* 2.7* 2.2* 2.2* 2.6*   CBC: Recent Labs  Lab 03/24/24 1807 03/26/24 0805 03/27/24 0201  WBC 9.3 5.3 4.5  NEUTROABS 7.6  --  2.5  HGB 9.5* 10.2* 9.6*  HCT 30.1* 31.9* 30.7*  MCV 93.5 92.2 91.1  PLT 147* 141* 171   CBG: Recent Labs  Lab 03/24/24 1832  GLUCAP 133*   Urinalysis    Component Value Date/Time   COLORURINE YELLOW 03/24/2024 2122   APPEARANCEUR HAZY (A) 03/24/2024 2122   LABSPEC 1.010 03/24/2024 2122   PHURINE 7.0 03/24/2024 2122   GLUCOSEU >=500 (A) 03/24/2024 2122   HGBUR NEGATIVE 03/24/2024 2122   BILIRUBINUR NEGATIVE 03/24/2024 2122   KETONESUR NEGATIVE 03/24/2024 2122   PROTEINUR >=300 (A) 03/24/2024 2122   UROBILINOGEN 0.2 01/14/2015 0346   NITRITE NEGATIVE 03/24/2024 2122   LEUKOCYTESUR LARGE (A) 03/24/2024 2122   Sepsis Labs Recent Labs  Lab 03/24/24 1807 03/26/24 0805 03/27/24 0201  WBC 9.3 5.3 4.5    Procedures/Studies: DG Chest Port 1 View Result Date: 03/18/2024 CLINICAL DATA:  Missed dialysis.  Weakness. EXAM: PORTABLE CHEST 1 VIEW COMPARISON:  02/17/2024 FINDINGS: Interval removal of central venous catheter. Postoperative changes in the lower cervical spine. Shallow inspiration. Cardiac enlargement with mild vascular congestion. Linear opacities in the lung bases likely representing atelectasis. Small left  pleural effusion is unchanged. No developing edema or consolidation. Calcification of the aorta. IMPRESSION: Cardiac enlargement with small left pleural effusion. Atelectasis in the lung bases. No developing edema. Electronically Signed   By: Elsie Gravely M.D.   On: 03/18/2024 23:56   ECHOCARDIOGRAM LIMITED Result Date: 03/18/2024    ECHOCARDIOGRAM LIMITED REPORT  Patient Name:   GERASIMOS PLOTTS Date of Exam: 03/18/2024 Medical Rec #:  [id number]     Height:       76.0 in Accession #:    [id number]    Weight:       250.0 lb Date of Birth:  05-03-1956     BSA:          2.436 m Patient Age:    67 years      BP:           181/92 mmHg Patient Gender: M             HR:           73 bpm. Exam Location:  [address] Procedure: Limited Echo, Cardiac Doppler and Color Doppler (Both Spectral and            Color Flow Doppler were utilized during procedure). Indications:     Pericardial Effusion I31.39                  CHF with preserved EF I50.32  History:         Patient has prior history of Echocardiogram examinations. CHF,                  PAD and COPD, Signs/Symptoms:Hypertensive Heart Disease; Risk                  Factors:Hypertension and Diabetes. Hyperlipidemia, H/o Hiatal                  hernia                  Patient's echo was done in a wheelchair. IVC was not                  visualized.  Sonographer:     Cherene Ravens Referring Phys:  [id number] GLENDIA DASEN WEAVER Diagnosing Phys: Wilbert Bihari MD IMPRESSIONS  1. Poor images at patient's echo was performed while sitting in a wheelchair.  2. Left ventricular ejection fraction, by estimation, is 55 to 60%. The left ventricle has normal function. Left ventricular endocardial border not optimally defined to evaluate regional wall motion. There is moderate concentric left ventricular hypertrophy. Left ventricular diastolic function could not be evaluated.  3. Right ventricular systolic function is normal. The right ventricular size is normal.  4. Left atrial size was  moderately dilated.  5. The mitral valve is degenerative. Mild mitral valve regurgitation. No evidence of mitral stenosis. Moderate mitral annular calcification.  6. The aortic valve is calcified. There is moderate calcification of the aortic valve. There is moderate thickening of the aortic valve. Aortic valve regurgitation is not visualized. Mild to moderate aortic valve stenosis. Aortic valve area, by VTI measures 1.08 cm. Aortic valve mean gradient measures 12.0 mmHg. Aortic valve Vmax measures 2.35 m/s. Although the VMax and mean AVG are c/w mild AS, the DVI and SVI are low. Findings most c/w at least mild to moderate low flow low gradient aortic stenosis.  7. Aortic dilatation noted. There is mild dilatation of the aortic root, measuring 39 mm.  8. Moderate pleural effusion in the left lateral region.  9. Trivial pericardial effusion is present. The pericardial effusion is posterior to the left ventricle. FINDINGS  Left Ventricle: Left ventricular ejection fraction, by estimation, is 55 to 60%. The left ventricle has normal function. Left ventricular endocardial border not optimally defined to evaluate regional wall motion. The left ventricular internal  cavity size was normal in size. There is moderate concentric left ventricular hypertrophy. Left ventricular diastolic function could not be evaluated. Right Ventricle: The right ventricular size is normal. No increase in right ventricular wall thickness. Right ventricular systolic function is normal. Left Atrium: Left atrial size was moderately dilated. Right Atrium: Right atrial size was normal in size. Pericardium: Trivial pericardial effusion is present. The pericardial effusion is posterior to the left ventricle. Mitral Valve: The mitral valve is degenerative in appearance. There is mild thickening of the mitral valve leaflet(s). There is moderate calcification of the mitral valve leaflet(s). Moderate mitral annular calcification. Mild mitral valve  regurgitation.  No evidence of mitral valve stenosis. Tricuspid Valve: The tricuspid valve is normal in structure. Tricuspid valve regurgitation is trivial. No evidence of tricuspid stenosis. Aortic Valve: The aortic valve is calcified. There is moderate calcification of the aortic valve. There is moderate thickening of the aortic valve. Aortic valve regurgitation is not visualized. Mild to moderate aortic stenosis is present. Aortic valve mean gradient measures 12.0 mmHg. Aortic valve peak gradient measures 22.1 mmHg. Aortic valve area, by VTI measures 1.08 cm. Pulmonic Valve: The pulmonic valve was normal in structure. Pulmonic valve regurgitation is trivial. No evidence of pulmonic stenosis. Aorta: Aortic dilatation noted. There is mild dilatation of the aortic root, measuring 39 mm. Venous: The inferior vena cava was not well visualized. IAS/Shunts: No atrial level shunt detected by color flow Doppler. Additional Comments: There is a moderate pleural effusion in the left lateral region.  LEFT VENTRICLE PLAX 2D LVIDd:         5.10 cm LVIDs:         3.50 cm LV PW:         1.60 cm LV IVS:        1.40 cm LVOT diam:     2.00 cm LV SV:         49 LV SV Index:   20 LVOT Area:     3.14 cm  RIGHT VENTRICLE RV S prime:     4.24 cm/s TAPSE (M-mode): 5.9 cm LEFT ATRIUM              Index LA diam:        4.80 cm  1.97 cm/m LA Vol (A2C):   121.0 ml 49.68 ml/m LA Vol (A4C):   77.4 ml  31.78 ml/m LA Biplane Vol: 97.8 ml  40.15 ml/m  AORTIC VALVE AV Area (Vmax):    1.01 cm AV Area (Vmean):   0.97 cm AV Area (VTI):     1.08 cm AV Vmax:           235.00 cm/s AV Vmean:          159.500 cm/s AV VTI:            0.452 m AV Peak Grad:      22.1 mmHg AV Mean Grad:      12.0 mmHg LVOT Vmax:         75.60 cm/s LVOT Vmean:        49.300 cm/s LVOT VTI:          0.156 m LVOT/AV VTI ratio: 0.35  AORTA Ao Root diam: 3.90 cm Ao Asc diam:  3.50 cm TRICUSPID VALVE TR Peak grad:   15.8 mmHg TR Vmax:        199.00 cm/s  SHUNTS Systemic  VTI:  0.16 m Systemic Diam: 2.00 cm Wilbert Bihari MD Electronically signed by Wilbert Bihari MD Signature  Date/Time: 03/18/2024/12:32:27 PM    Final (Updated)     Time coordinating discharge: 55 mins  SIGNED:  Camellia Door, DO [facility] 03/28/24, 10:00 AM

## 2024-03-27 NOTE — Assessment & Plan Note (Addendum)
 03-27-2024 stable. Continue fentanyl  patch 50 mcg/hr q3d and prn oxycodone .  03-28-2024 stable. Replaced fentanyl  patch yesterday.

## 2024-03-27 NOTE — Plan of Care (Signed)

## 2024-03-27 NOTE — Progress Notes (Addendum)
 [name] Progress Note   Subjective:  Seen in room. No CP/dyspnea. Diarrhea slowly improving - not 100% yet. Looks like will be discharged today.  Objective Vitals:   03/26/24 2033 03/27/24 0019 03/27/24 0334 03/27/24 0806  BP: (!) 192/74 (!) 178/69 (!) 190/82 (!) 199/77  Pulse: 83 76 75 78  Resp: 18 18 18 17   Temp: 98 F (36.7 C) 98 F (36.7 C) (!) 97.5 F (36.4 C) 98.3 F (36.8 C)  TempSrc: Oral Oral    SpO2: 100% 100% 100% 99%  Weight:      Height:       Physical Exam General: Well appearing man, NAD. RA Heart: RRR; no murmur Lungs: CTA anteriorly Abdomen: soft Extremities: B BKA, 1+ B stump edema Dialysis Access: LUE AVF +t/b    Additional Objective Labs: Basic Metabolic Panel: Recent Labs  Lab 03/25/24 1100 03/26/24 0805 03/27/24 0201  NA 137 134* 136  K 3.7 3.6 3.3*  CL 97* 97* 98  CO2 24 23 26   GLUCOSE 127* 188* 155*  BUN 32* 39* 23  CREATININE 5.75* 6.48* 4.52*  CALCIUM  8.0* 7.7* 7.7*  PHOS 5.3* 6.1*  --    Liver Function Tests: Recent Labs  Lab 03/24/24 1807 03/25/24 1100 03/26/24 0805 03/27/24 0201  AST 18  --   --  21  ALT 11  --   --  14  ALKPHOS 95  --   --  82  BILITOT 0.9  --   --  0.4  PROT 5.5*  --   --  5.1*  ALBUMIN  2.6* 2.7* 2.2* 2.2*   CBC: Recent Labs  Lab 03/24/24 1807 03/26/24 0805 03/27/24 0201  WBC 9.3 5.3 4.5  NEUTROABS 7.6  --  2.5  HGB 9.5* 10.2* 9.6*  HCT 30.1* 31.9* 30.7*  MCV 93.5 92.2 91.1  PLT 147* 141* 171   Medications:   amLODipine   5 mg Oral Daily   apixaban   5 mg Oral BID   atorvastatin   80 mg Oral QHS   clopidogrel   75 mg Oral Q breakfast   fentaNYL   1 patch Transdermal Q3 days   fidaxomicin   200 mg Oral BID   Followed by   NOREEN ON 03/30/2024] fidaxomicin   200 mg Oral QODAY   gabapentin   100 mg Oral TID   [name]'s butt cream   Topical BID   isosorbide  mononitrate  60 mg Oral Daily   potassium chloride   40 mEq Oral Once   rOPINIRole   2 mg Oral QHS   sevelamer   carbonate  1,600 mg Oral TID WC   tamsulosin   0.4 mg Oral Daily    Dialysis Orders TTS - [facility] -> full HD 7/15, but left at 119.3kg (way up), had missed HD on 7/12 and the week before? Last HD 7/5 4:15hr, 450/800, EDW 108kg, 2K/2Ca bath, AVF, UFP #2, no heparin  - Mircera 200mcg IV q 2 weeks (last 7/5) - Venofer  50mg  IV weekly - Hectoral 2mcg IV q HD   Assessment/Plan:  C. Diff colitis: On Dificid  BID, slight improvement - follow.  ?UTI: Finished course of Ceftriaxone .  ESRD:  Continue HD on TTS schedule - next HD tomorrow.  Hypertension/volume: BP high, still has fluid - will keep working on it will outpatient HD, reminded on fluid restrictions.  Anemia: Hgb  9.6 - resume ESA as outpatient  Metabolic bone disease: Ca/Phos ok - - see discussion below    [name], PA-C 03/27/2024, 10:29 AM  [name]   Seen  and examined independently.  Agree with note and exam as documented above by physician extender and as noted here.  General adult male in bed in no acute distress HEENT normocephalic atraumatic extraocular movements intact sclera anicteric Neck supple trachea midline Lungs clear to auscultation bilaterally normal work of breathing at rest on room air Heart S1S2 no rub Abdomen soft nontender nondistended Extremities trace edema of bilateral residual limbs  Psych normal mood and affect Neuro alert and oriented x 3 provides hx and follows commands  Access LUE AVF with bruit and thrill   # End-stage renal disease HD per TTS schedule  # C. difficile colitis Most recently with diarrhea. Regimen per primary team. On fidaxomicin    # Hypertension - Optimize volume status with HD   # Metabolic bone disease - He is reported above is having colon surgery and has diarrhea preventing him from coming to HD.  I imagine this has been investigated outpatient but for right now would recommend changing to PhosLo  inpatient and then discussing best outpatient regimen  for him  [name], MD 03/27/2024  2:15 PM

## 2024-03-27 NOTE — Assessment & Plan Note (Addendum)
 03-27-2024 pt give 1 dose of po kcl prior to discharge.  03-28-2024 stable.

## 2024-03-28 DIAGNOSIS — N186 End stage renal disease: Secondary | ICD-10-CM | POA: Diagnosis not present

## 2024-03-28 DIAGNOSIS — L89152 Pressure ulcer of sacral region, stage 2: Secondary | ICD-10-CM | POA: Diagnosis not present

## 2024-03-28 DIAGNOSIS — A0471 Enterocolitis due to Clostridium difficile, recurrent: Secondary | ICD-10-CM | POA: Diagnosis not present

## 2024-03-28 DIAGNOSIS — Z91199 Patient's noncompliance with other medical treatment and regimen due to unspecified reason: Secondary | ICD-10-CM

## 2024-03-28 LAB — RENAL FUNCTION PANEL
Albumin: 2.6 g/dL — ABNORMAL LOW (ref 3.5–5.0)
Anion gap: 16 — ABNORMAL HIGH (ref 5–15)
BUN: 30 mg/dL — ABNORMAL HIGH (ref 8–23)
CO2: 25 mmol/L (ref 22–32)
Calcium: 8 mg/dL — ABNORMAL LOW (ref 8.9–10.3)
Chloride: 95 mmol/L — ABNORMAL LOW (ref 98–111)
Creatinine, Ser: 5.86 mg/dL — ABNORMAL HIGH (ref 0.61–1.24)
GFR, Estimated: 10 mL/min — ABNORMAL LOW (ref >60)
Glucose, Bld: 153 mg/dL — ABNORMAL HIGH (ref 70–99)
Phosphorus: 5.1 mg/dL — ABNORMAL HIGH (ref 2.5–4.6)
Potassium: 3.9 mmol/L (ref 3.5–5.1)
Sodium: 136 mmol/L (ref 135–145)

## 2024-03-28 MED ORDER — HEPARIN SODIUM (PORCINE) 1000 UNIT/ML DIALYSIS: 1000.0000 [IU] | INTRAMUSCULAR | Status: DC | PRN

## 2024-03-28 MED ORDER — LIDOCAINE-PRILOCAINE 2.5-2.5 % EX CREA: 1.0000 | TOPICAL_CREAM | CUTANEOUS | Status: DC | PRN

## 2024-03-28 MED ORDER — PENTAFLUOROPROP-TETRAFLUOROETH EX AERO: 1.0000 | INHALATION_SPRAY | CUTANEOUS | Status: DC | PRN

## 2024-03-28 MED ORDER — ANTICOAGULANT SODIUM CITRATE 4% (200MG/5ML) IV SOLN: 5.0000 mL | Status: DC | PRN

## 2024-03-28 MED ORDER — ALTEPLASE 2 MG IJ SOLR: 2.0000 mg | Freq: Once | INTRAMUSCULAR | Status: DC | PRN

## 2024-03-28 MED ORDER — LIDOCAINE HCL (PF) 1 % IJ SOLN: 5.0000 mL | INTRAMUSCULAR | Status: DC | PRN

## 2024-03-28 NOTE — Plan of Care (Signed)
   Problem: Education: Goal: Knowledge of General Education information will improve Description: Including pain rating scale, medication(s)/side effects and non-pharmacologic comfort measures Outcome: Adequate for Discharge   Problem: Health Behavior/Discharge Planning: Goal: Ability to manage health-related needs will improve Outcome: Adequate for Discharge   Problem: Clinical Measurements: Goal: Ability to maintain clinical measurements within normal limits will improve Outcome: Adequate for Discharge Goal: Will remain free from infection Outcome: Adequate for Discharge Goal: Diagnostic test results will improve Outcome: Adequate for Discharge Goal: Respiratory complications will improve Outcome: Adequate for Discharge Goal: Cardiovascular complication will be avoided Outcome: Adequate for Discharge   Problem: Activity: Goal: Risk for activity intolerance will decrease Outcome: Adequate for Discharge   Problem: Nutrition: Goal: Adequate nutrition will be maintained Outcome: Adequate for Discharge   Problem: Coping: Goal: Level of anxiety will decrease Outcome: Adequate for Discharge   Problem: Elimination: Goal: Will not experience complications related to bowel motility Outcome: Adequate for Discharge Goal: Will not experience complications related to urinary retention Outcome: Adequate for Discharge   Problem: Pain Managment: Goal: General experience of comfort will improve and/or be controlled Outcome: Adequate for Discharge   Problem: Safety: Goal: Ability to remain free from injury will improve Outcome: Adequate for Discharge   Problem: Skin Integrity: Goal: Risk for impaired skin integrity will decrease Outcome: Adequate for Discharge

## 2024-03-28 NOTE — Progress Notes (Signed)
 Received patient in bed to unit.  Alert and oriented.  Informed consent signed and in chart.   TX duration:3hours  Patient tolerated well.  Transported back to the room  Alert, without acute distress.  Hand-off given to patient's nurse.   Access used: Left AV Fistula Upper Arm Access issues: none  Total UF removed: 3L Medication(s) given: none   03/28/24 1221  Vitals  Temp 98.5 F (36.9 C)  Temp Source Oral  BP (!) 118/48  ECG Heart Rate 80  Resp 18  Weight 108 kg  Oxygen  Therapy  SpO2 100 %  O2 Device Room Air  During Treatment Monitoring  Duration of HD Treatment -hour(s) 3 hour(s)  HD Safety Checks Performed Yes  Intra-Hemodialysis Comments Tx completed  Dialysis Fluid Bolus Normal Saline  Bolus Amount (mL) 300 mL  Post Treatment  Dialyzer Clearance Clear  Liters Processed 72  Fluid Removed (mL) 3000 mL  Tolerated HD Treatment Yes  Post-Hemodialysis Comments Dr. Jerrye lowered UF to  AVG/AVF Arterial Site Held (minutes) 7 minutes  AVG/AVF Venous Site Held (minutes) 7 minutes  Fistula / Graft Left Upper arm Arteriovenous fistula  Placement Date/Time: 07/21/20 0825   Placed prior to admission: No  Orientation: Left  Access Location: Upper arm  Access Type: Arteriovenous fistula  Status Deaccessed     Camellia Brasil LPN [facility]

## 2024-03-28 NOTE — Care Management (Signed)
  Overall Status IM Appeal Case: [id number] is complete.  1. Appeal Started 03/27/2024 13:01:34  2. Medical Record Requested 03/27/2024 14:19:10  3. Documents Received 03/27/2024 16:50:01  4. In Clinical Review 03/27/2024 18:55:12  5. Physician Review Completed 03/28/2024 09:18:31  6. Determination The Physician agreed with the notice. Beneficiary Liability Starts on 03/29/2024.  7. Notification Provider: 03/28/2024 09:56:12 Caller: 03/28/2024 09:50:05  8. Case Complete Yes

## 2024-03-28 NOTE — Procedures (Signed)
 Seen and examined on dialysis.  Procedure supervised.  Blood pressure 104/49 and HR 79.  Reduced goal by 0.5 kg to 3.5 kg.  Left AVF in use.     [name], MD 03/28/2024 11:12 AM

## 2024-03-28 NOTE — Care Management (Signed)
 Appeal [id number] Remains under clinical review per [employer] website     Overall Status Appeal Case: is in Clinical Review.  1. Appeal Started 03/27/2024 13:01:34  2. Medical Record Requested 03/27/2024 14:19:10  3. Documents Received 03/27/2024 16:50:01  4. In Clinical Review 03/27/2024 18:55:12

## 2024-03-28 NOTE — Plan of Care (Signed)

## 2024-03-28 NOTE — Assessment & Plan Note (Signed)
 03-28-2024 According to nephrology notes from this AM., pt had [employer] pizza delivered to his room yesterday evening. That is definitely not on a renal/diabetic diet. Pt's subsequent BP have been elevated. Likely due to the large sodium load from the [employer] pizza.

## 2024-03-28 NOTE — Progress Notes (Addendum)
 PROGRESS NOTE    [name]  FMW:[id number] DOB: 1956-01-09 DOA: 03/24/2024 PCP: Montey Lot, PA-C  Subjective: Pt seen and examined. Stable. Only 1 stool recorded yesterday. Type 6(mushy). Pt and wife appealed discharge stating that SNF doesn't have his meds. No evidence that this is true. Pt's DC summary and orders completed by 10:30 AM yesterday. This would give SNF pharmacy plenty of time to get meds delivered.  According to nephrology notes from this AM., pt had [employer] pizza delivered to his room yesterday evening. That is definitely not on a renal/diabetic diet. Pt's subsequent BP have been elevated. Likely due to the large sodium load from the [employer] pizza.  There is no medical reason for pt to remain in the hospital. He has reach maximum hospital benefit. Today is a hemodialysis day for him.    Pt states he is agreeable to going back to SNF today.   Hospital Course: HPI: [name] is a 68 y.o. male with medical history significant for ESRD on HD TTS, C-diff infection, sacral decubitus ulcer, type 2 diabetes, hyperlipidemia, hypertension, COPD, peripheral artery disease status post bilateral below the knee amputation, paroxysmal A-fib on Eliquis , coronary artery disease status post PCI with drug-eluting stent placed on 11/24/2023 on Plavix , who presents to the ER due to persistent nausea for the past few days, watery stool for weeks, and a sacral decubitus wound.   In the ER, UA positive for pyuria.  C. difficile positive.  Started on Rocephin  and Fidaxomicin .  Wound care specialist consulted for the sacral decubitus ulcer.  TRH, hospitalist service, was asked to admit.   ED Course: Temperature 99.0.  BP 109/50, pulse 72, respiratory 18, saturation 97% on room air.  Lab studies notable for WBC 9.3, platelet count 147, hemoglobin 9.5.  Significant Events: Admitted 03/24/2024 for sacral decub ulcer   Admission Labs: WBC 9.3, HgB 9.5, plt 147 Na 136, K 3.4, CO2 of 27,  BUN 25, Scr 4.93, glu 144 T prot 5.5, alb 2.6, AST 18, ALT 11 UA negative nitrite, large LE, WBC >50, few bacteria C. Diff antigen positive, Toxin positive  Admission Imaging Studies:   Significant Labs:   Significant Imaging Studies:   Antibiotic Therapy: Anti-infectives (From admission, onward)    Start     Dose/Rate Route Frequency Ordered Stop   03/25/24 0700  cefTRIAXone  (ROCEPHIN ) 1 g in sodium chloride  0.9 % 100 mL IVPB        1 g 200 mL/hr over 30 Minutes Intravenous Daily 03/25/24 0624     03/25/24 0030  fidaxomicin  (DIFICID ) tablet 200 mg        200 mg Oral 2 times daily 03/25/24 0019 04/03/24 2159       Procedures:   Consultants: nephrology    Assessment and Plan: * Decubitus ulcer of sacral region, stage 2 (HCC) 03-25-2024 present on admission. Seen by wound care.          03-26-2024 continue with wound care recs Noted to have stage 2 pressure injury to buttock in March 2025.  Has had recent episode of watery stool and skin breakdown is consistent with pressure and moisture.  Staff in room and assist with turning patient to assess wound.  Has deep tissue injury to bilateral posterior thighs, consistent with prolonged time on bedpan.  Present on admission.  Intact at this time.  Wound type: moisture and pressure Deep tissue injury  Pressure Injury POA: Yes Measurement: Bilateral posterior thighs:  Curved maroon discoloration that  matches bedpan pattern.  8 cm x 1 cm intact Coccyx:  4 cmx 3 cm x 0.2 cm nonintact , consistent with stage 2 pressure injury Bilateral buttocks with partial thickness tissue loss in gluteal fold.   Posterior scrotal skin breakdown, scattered weeping nonintact areas.  Wound bed: red and moist Drainage (amount, consistency, odor) minimal serosanguinous  no odor Periwound: Dry skin, frequent exposure to loose stool and urine.  Dressing procedure/placement/frequency: Cleanse buttock/sacral and perineal skin with soap and water  and  pat dry. Sacral foam to coccyx to pad and protect.  Gerhardts to bilateral gluteal folds, scrotum and perineal breakdown.(incontinence associated dermatitis)   Patient with bilateral BKA, limited bed mobility.  Some scabbing to distal end of amputation sites, bilaterally.  Consistent with trauma from transfers, suture lines are intact and healed.  Recommend Low air loss mattress.   03-27-2024 DC to SNF with wound care orders.  03-28-2024 stable for DC to SNF with wound care orders.  Non compliance with medical treatment 03-28-2024 According to nephrology notes from this AM., pt had [employer] pizza delivered to his room yesterday evening. That is definitely not on a renal/diabetic diet. Pt's subsequent BP have been elevated. Likely due to the large sodium load from the [employer] pizza.     Recurrent Clostridioides difficile diarrhea 03-25-2024 continue with Bid dificid . Pt states diarrhea is improving. Nurse tech verifies that diarrhea consistency is getting thicker.  03-26-2024 pt states diarrhea is improving but not gone. Continue with dificid  bid.  03-27-2024 Having type 6(clay like) BM according to nursing documentation. No fevers. Had HD yesterday. Discussed with pharmacy. Will complete 10 days of dificid  and then 20 days taper(every other day dosing) at discharge. Stable for DC back to SNF.   03-28-2024 Only 1 stool recorded yesterday. Type 6(mushy). Remains on Dificid . Will place on tapering dose at SNF when he is discharged.  Acute cystitis without hematuria-resolved as of 03/27/2024 03-25-2024 pt still urinates despite ESRD on HD. On IV rocephin . Limit to 3 days of treatment. No urine cx sent.  03-26-2024 Discussed with pharmacy. Don't think pt has a UTI. Going to stop IV rocephin .   03-27-2024 resolved.  Obesity, Class I, BMI 30-34.9 Body mass index is 32.1 kg/m.   ESRD (end stage renal disease) on dialysis [facility]) 03-25-2024 nephrology consulted for routine HD on T, Th,  Sat  03-26-2024 stable. For HD today.  03-27-2024 stable. Continue routine HD on T, Th, Sat  03-28-2024 stable. For HD today. Discussed with nephrology.  Paroxysmal atrial fibrillation (HCC) 03-25-2024 stable. On Eliquis .  03-26-2024 stable.   03-27-2024 stable. On Eliquis .  03-28-2024 stable.  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 [facility]. He plans on returning there.  03-26-2024 stable.   03-27-2024 stable  03-28-2024 stable. chronic  Anemia due to chronic kidney disease 03-25-2024 stable.  03-26-2024 stable.   03-27-2024 stable. Discharge HgB 9.6 g/dl  [id number] stable.  COPD (chronic obstructive pulmonary disease) (HCC) 03-25-2024 stable. On RA  03-26-2024 stable. On RA  03-27-2024 stable. On RA  03-28-2024 stable. On RA.  Chronic pain 03-27-2024 stable. Continue fentanyl  patch 50 mcg/hr q3d and prn oxycodone .  03-28-2024 stable. Replaced fentanyl  patch yesterday.  Type 2 diabetes mellitus with diabetic neuropathy (HCC) 03-25-2024 on SSI  03-26-2024 stable. CBG ranges acceptable.  03-27-2024 stable  03-28-2024 stable.  GERD (gastroesophageal reflux disease) 03-25-2024 stable. Holding pepcid  and PPI due to recurrent C. Diff.  03-26-2024 stable.  03-27-2024 stable  03-28-2024 stable.  Hypokalemia 03-27-2024 pt give 1 dose of po kcl prior to discharge.  03-28-2024 stable.   DVT prophylaxis:  apixaban  (ELIQUIS ) tablet 5 mg     Code Status: Full Code Family Communication: no family at bedside. Pt is decisional. Disposition Plan: SNF Reason for continuing need for hospitalization: medically stable for DC.  Objective: Vitals:   03/28/24 0902 03/28/24 0906 03/28/24 0914 03/28/24 0930  BP:  (!) 86/70 (!) 136/59 110/77  Pulse:      Resp:  20 19 19   Temp:  98.5 F (36.9 C)    TempSrc:  Oral    SpO2:  100% 100% 100%  Weight: 108.9 kg     Height:         Intake/Output Summary (Last 24 hours) at 03/28/2024 1003 Last data filed at 03/28/2024 0459 Gross per 24 hour  Intake --  Output 1000 ml  Net -1000 ml   Filed Weights   03/24/24 1722 03/28/24 0902  Weight: 113.4 kg 108.9 kg    Examination:  Physical Exam Vitals and nursing note reviewed.  Constitutional:      Appearance: He is obese.  HENT:     Head: Normocephalic and atraumatic.     Nose: Nose normal.  Cardiovascular:     Rate and Rhythm: Normal rate and regular rhythm.  Pulmonary:     Effort: Pulmonary effort is normal.     Breath sounds: Normal breath sounds.  Abdominal:     General: Abdomen is protuberant. Bowel sounds are normal. There is no distension.     Palpations: Abdomen is soft.     Tenderness: There is no abdominal tenderness. There is no guarding or rebound.  Musculoskeletal:     Comments: Bilateral BKA  Skin:    Capillary Refill: Capillary refill takes less than 2 seconds.  Neurological:     General: No focal deficit present.     Mental Status: He is alert and oriented to person, place, and time.     Data Reviewed: I have personally reviewed following labs and imaging studies  CBC: Recent Labs  Lab 03/24/24 1807 03/26/24 0805 03/27/24 0201  WBC 9.3 5.3 4.5  NEUTROABS 7.6  --  2.5  HGB 9.5* 10.2* 9.6*  HCT 30.1* 31.9* 30.7*  MCV 93.5 92.2 91.1  PLT 147* 141* 171   Basic Metabolic Panel: Recent Labs  Lab 03/24/24 1807 03/25/24 1100 03/26/24 0805 03/27/24 0201 03/28/24 0716  NA 136 137 134* 136 136  K 3.4* 3.7 3.6 3.3* 3.9  CL 96* 97* 97* 98 95*  CO2 27 24 23 26 25   GLUCOSE 144* 127* 188* 155* 153*  BUN 25* 32* 39* 23 30*  CREATININE 4.93* 5.75* 6.48* 4.52* 5.86*  CALCIUM  7.3* 8.0* 7.7* 7.7* 8.0*  PHOS  --  5.3* 6.1*  --  5.1*   GFR: Estimated Creatinine Clearance: 15.6 mL/min (A) (by C-G formula based on SCr of 5.86 mg/dL (H)). Liver Function Tests: Recent Labs  Lab 03/24/24 1807 03/25/24 1100 03/26/24 0805  03/27/24 0201 03/28/24 0716  AST 18  --   --  21  --   ALT 11  --   --  14  --   ALKPHOS 95  --   --  82  --   BILITOT 0.9  --   --  0.4  --   PROT 5.5*  --   --  5.1*  --   ALBUMIN  2.6* 2.7* 2.2* 2.2* 2.6*   BNP (  last 3 results) Recent Labs    02/12/24 1638  BNP 387.9*   CBG: Recent Labs  Lab 03/24/24 1832  GLUCAP 133*   Recent Results (from the past 240 hours)  C Difficile Quick Screen w PCR reflex     Status: Abnormal   Collection Time: 03/24/24  9:21 PM   Specimen: STOOL  Result Value Ref Range Status   C Diff antigen POSITIVE (A) NEGATIVE Final   C Diff toxin POSITIVE (A) NEGATIVE Final   C Diff interpretation Toxin producing C. difficile detected.  Final    Comment: RESULT CALLED TO, READ BACK BY AND VERIFIED WITH: B NORMENT RN 03/25/2024 @ 0017 BY AB Performed at [facility], [address] [place], [place] [address]      Scheduled Meds:  amLODipine   5 mg Oral Daily   apixaban   5 mg Oral BID   atorvastatin   80 mg Oral QHS   Chlorhexidine  Gluconate Cloth  6 each Topical Q0600   clopidogrel   75 mg Oral Q breakfast   fentaNYL   1 patch Transdermal Q3 days   fidaxomicin   200 mg Oral BID   Followed by   NOREEN ON 03/30/2024] fidaxomicin   200 mg Oral QODAY   gabapentin   100 mg Oral TID   [name]'s butt cream   Topical BID   isosorbide  mononitrate  60 mg Oral Daily   rOPINIRole   2 mg Oral QHS   sevelamer  carbonate  1,600 mg Oral TID WC   tamsulosin   0.4 mg Oral Daily   Continuous Infusions:  anticoagulant sodium citrate        LOS: 2 days   Time spent: 55 minutes  Camellia Door, DO  [facility]  03/28/2024, 10:03 AM

## 2024-03-28 NOTE — TOC Transition Note (Signed)
 Transition of Care [facility]) - Discharge Note   Patient Details  Name: [name] MRN: [id number] Date of Birth: [birth year]  [facility] CM/SW Contact:  Gwenn Julien [place], [place] Phone Number: 03/28/2024, 1:11 PM   Clinical Narrative: Pt for dc to [facility] today. Spoke to [employer] at [facility] who confirmed they are prepared to admit pt to room 313A. Pt's wife Delores aware of dc and reports agreeable. RN provided with number for report and [facility] arranged for transport. SW signing off at dc.    Julien Gwenn, MSW, LCSW [phone] (coverage)      Final next level of care: Skilled Nursing Facility Barriers to Discharge: Barriers Resolved   Patient Goals and CMS Choice            Discharge Placement              Patient chooses bed at: [facility] Patient to be transferred to facility by: [facility] Name of family member notified: Delores/wife Patient and family notified of of transfer: 03/28/24  Discharge Plan and Services Additional resources added to the After Visit Summary for                                       Social Drivers of Health (SDOH) Interventions SDOH Screenings   Food Insecurity: No Food Insecurity (03/25/2024)  Housing: Low Risk  (03/25/2024)  Transportation Needs: No Transportation Needs (03/25/2024)  Utilities: Not At Risk (03/25/2024)  Social Connections: Moderately Isolated (03/25/2024)  Tobacco Use: High Risk (03/24/2024)     Readmission Risk Interventions    05/29/2023    4:41 PM  Readmission Risk Prevention Plan  Transportation Screening Complete  Medication Review (RN Care Manager) Complete  PCP or Specialist appointment within 3-5 days of discharge Complete  HRI or Home Care Consult Complete  SW Recovery Care/Counseling Consult Complete  Palliative Care Screening Not Applicable  Skilled Nursing Facility Complete

## 2024-03-28 NOTE — Progress Notes (Signed)
 [facility] Progress Note   Subjective:   Didn't end up going home yesterday.  Last HD on 7/17 with 5 kg UF.  He had 1 liter UOP over 7/18.  He states he lives at SNF currently - he had not felt ready to go yesterday.  He feels better today.  He offered me a slice of [employer] pizza delivered to his bedside overnight which I politely declined.   Review of systems:  Denies shortness of breath or chest pain  Denies n/v Diarrhea better/resolving    Objective Vitals:   03/27/24 1701 03/27/24 1956 03/28/24 0004 03/28/24 0453  BP: (!) 185/88 (!) 171/77 (!) 167/84 (!) 191/104  Pulse: 85 83 82 84  Resp: 20 18 18 18   Temp: 98.3 F (36.8 C) 98.6 F (37 C) 98.6 F (37 C) 98.5 F (36.9 C)  TempSrc: Oral Oral Oral Oral  SpO2: 99% 100% 100% 100%  Weight:      Height:       Physical Exam   General adult male in bed in no acute distress HEENT normocephalic atraumatic extraocular movements intact sclera anicteric Neck supple trachea midline Lungs clear to auscultation bilaterally normal work of breathing at rest on room air Heart S1S2 no rub Abdomen soft nontender distended obese habitus Extremities trace edema of bilateral residual limbs  Psych normal mood and affect Neuro alert and oriented x 3 provides hx and follows commands  Access LUE AVF with bruit and thrill    Additional Objective Labs: Basic Metabolic Panel: Recent Labs  Lab 03/25/24 1100 03/26/24 0805 03/27/24 0201  NA 137 134* 136  K 3.7 3.6 3.3*  CL 97* 97* 98  CO2 24 23 26   GLUCOSE 127* 188* 155*  BUN 32* 39* 23  CREATININE 5.75* 6.48* 4.52*  CALCIUM  8.0* 7.7* 7.7*  PHOS 5.3* 6.1*  --    Liver Function Tests: Recent Labs  Lab 03/24/24 1807 03/25/24 1100 03/26/24 0805 03/27/24 0201  AST 18  --   --  21  ALT 11  --   --  14  ALKPHOS 95  --   --  82  BILITOT 0.9  --   --  0.4  PROT 5.5*  --   --  5.1*  ALBUMIN  2.6* 2.7* 2.2* 2.2*   CBC: Recent Labs  Lab 03/24/24 1807  03/26/24 0805 03/27/24 0201  WBC 9.3 5.3 4.5  NEUTROABS 7.6  --  2.5  HGB 9.5* 10.2* 9.6*  HCT 30.1* 31.9* 30.7*  MCV 93.5 92.2 91.1  PLT 147* 141* 171   Medications:   amLODipine   5 mg Oral Daily   apixaban   5 mg Oral BID   atorvastatin   80 mg Oral QHS   Chlorhexidine  Gluconate Cloth  6 each Topical Q0600   clopidogrel   75 mg Oral Q breakfast   fentaNYL   1 patch Transdermal Q3 days   fidaxomicin   200 mg Oral BID   Followed by   NOREEN ON 03/30/2024] fidaxomicin   200 mg Oral QODAY   gabapentin   100 mg Oral TID   [name]'s butt cream   Topical BID   isosorbide  mononitrate  60 mg Oral Daily   rOPINIRole   2 mg Oral QHS   sevelamer  carbonate  1,600 mg Oral TID WC   tamsulosin   0.4 mg Oral Daily    Dialysis Orders TTS - [facility] -> full HD 7/15, but left at 119.3kg (way up), had missed HD on 7/12 and the week before? Last HD 7/5 4:15hr,  450/800, EDW 108kg, 2K/2Ca bath, AVF, UFP #2, no heparin  - Mircera 200mcg IV q 2 weeks (last 7/5) - Venofer  50mg  IV weekly - Hectoral 2mcg IV q HD   Assessment/Plan:   # End-stage renal disease - HD per TTS schedule  # C. difficile colitis - Diarrhea much improved  - Regimen per primary team.  - On fidaxomicin    # Hypertension - Optimize volume status with HD   # Anemia of CKD  - last ESA on 7/5 outpatient - Hb acceptable last check - resume ESA outpatient  # Metabolic bone disease - He is reported above is having colon surgery and has diarrhea preventing him from coming to HD.  I imagine this has been investigated outpatient but for right now would recommend changing to [facility]  inpatient and then discussing best outpatient regimen for him  Disposition per primary team.  Stable from a strictly renal standpoint  [name], MD 03/28/2024  6:54 AM

## 2024-03-30 DIAGNOSIS — R8281 Pyuria: Secondary | ICD-10-CM | POA: Diagnosis not present

## 2024-03-30 DIAGNOSIS — A0471 Enterocolitis due to Clostridium difficile, recurrent: Secondary | ICD-10-CM | POA: Diagnosis not present

## 2024-03-30 NOTE — Progress Notes (Signed)
 Late Note Entry- March 30, 2024  Pt d/c on Saturday. Contacted [facility] this morning to be advised of pt's d/c date and that pt should resume care tomorrow.   Randine Mungo Renal Navigator [phone]

## 2024-03-31 DIAGNOSIS — L89151 Pressure ulcer of sacral region, stage 1: Secondary | ICD-10-CM | POA: Diagnosis not present

## 2024-03-31 DIAGNOSIS — A0471 Enterocolitis due to Clostridium difficile, recurrent: Secondary | ICD-10-CM | POA: Diagnosis not present

## 2024-03-31 DIAGNOSIS — L89152 Pressure ulcer of sacral region, stage 2: Secondary | ICD-10-CM | POA: Diagnosis not present

## 2024-03-31 DIAGNOSIS — E8770 Fluid overload, unspecified: Secondary | ICD-10-CM | POA: Diagnosis not present

## 2024-04-01 DIAGNOSIS — E119 Type 2 diabetes mellitus without complications: Secondary | ICD-10-CM | POA: Diagnosis not present

## 2024-04-01 DIAGNOSIS — L89159 Pressure ulcer of sacral region, unspecified stage: Secondary | ICD-10-CM | POA: Diagnosis not present

## 2024-04-01 DIAGNOSIS — N186 End stage renal disease: Secondary | ICD-10-CM | POA: Diagnosis not present

## 2024-04-01 DIAGNOSIS — A0472 Enterocolitis due to Clostridium difficile, not specified as recurrent: Secondary | ICD-10-CM | POA: Diagnosis not present

## 2024-04-07 DIAGNOSIS — E1165 Type 2 diabetes mellitus with hyperglycemia: Secondary | ICD-10-CM | POA: Diagnosis not present

## 2024-04-07 DIAGNOSIS — Z992 Dependence on renal dialysis: Secondary | ICD-10-CM | POA: Diagnosis not present

## 2024-04-07 DIAGNOSIS — N186 End stage renal disease: Secondary | ICD-10-CM | POA: Diagnosis not present

## 2024-04-07 DIAGNOSIS — A0471 Enterocolitis due to Clostridium difficile, recurrent: Secondary | ICD-10-CM | POA: Diagnosis not present

## 2024-04-09 DIAGNOSIS — Z992 Dependence on renal dialysis: Secondary | ICD-10-CM | POA: Diagnosis not present

## 2024-04-09 DIAGNOSIS — E1122 Type 2 diabetes mellitus with diabetic chronic kidney disease: Secondary | ICD-10-CM | POA: Diagnosis not present

## 2024-04-09 DIAGNOSIS — N186 End stage renal disease: Secondary | ICD-10-CM | POA: Diagnosis not present

## 2024-04-13 DIAGNOSIS — S80211A Abrasion, right knee, initial encounter: Secondary | ICD-10-CM | POA: Diagnosis not present

## 2024-04-17 DIAGNOSIS — M1990 Unspecified osteoarthritis, unspecified site: Secondary | ICD-10-CM | POA: Diagnosis not present

## 2024-04-17 DIAGNOSIS — G8929 Other chronic pain: Secondary | ICD-10-CM | POA: Diagnosis not present

## 2024-04-20 ENCOUNTER — Encounter: Payer: Self-pay | Admitting: Orthopedic Surgery

## 2024-04-20 ENCOUNTER — Ambulatory Visit: Admitting: Orthopedic Surgery

## 2024-04-20 DIAGNOSIS — Z89511 Acquired absence of right leg below knee: Secondary | ICD-10-CM | POA: Diagnosis not present

## 2024-04-20 DIAGNOSIS — Z515 Encounter for palliative care: Secondary | ICD-10-CM | POA: Diagnosis not present

## 2024-04-20 DIAGNOSIS — Z89512 Acquired absence of left leg below knee: Secondary | ICD-10-CM | POA: Diagnosis not present

## 2024-04-20 NOTE — Progress Notes (Signed)
 Office Visit Note   Patient: [name]           Date of Birth: 08/17/1956           MRN: [id number] Visit Date: 04/20/2024              Requested by: Montey Lot, PA-C [address] [place],  [place] [address] PCP: Montey Lot, PA-C  Chief Complaint  Patient presents with   Right Leg - Follow-up   Left Leg - Follow-up      HPI: Patient is a 68 year old gentleman who is seen in follow-up for bilateral transtibial amputations.  Patient is currently in a motorized wheelchair uses a Hoyer lift for transfers and has increased swelling of both lower extremities with a traumatic wound to the right knee.  Assessment & Plan: Visit Diagnoses:  1. S/P BKA (below knee amputation) unilateral, left (HCC)   2. S/P BKA (below knee amputation), right (HCC)     Plan: Patient is provided a prescription for [employer] for stump shrinker's.  Patient will need fitting for prosthetics.  Prescription provided for prosthetics as well.  Follow-Up Instructions: Return if symptoms worsen or fail to improve.   Ortho Exam  Patient is alert, oriented, no adenopathy, well-dressed, normal affect, normal respiratory effort. Examination patient has increased pitting edema both lower extremities.  There is a superficial ulcer on the left leg but no cellulitis no drainage.  On the right leg patient has a traumatic wound with increased swelling in the residual limb as well.  Findings consistent with lymphatic and venous insufficiency.  No signs of infection in either leg.  Patient is an existing bilateral transtibial  amputee.  Patient's current comorbidities are not expected to impact the ability to function with the prescribed prosthesis. Patient verbally communicates a strong desire to use a prosthesis. Patient currently requires mobility aids to ambulate without a prosthesis.  Expects not to use mobility aids with a new prosthesis. Patient is expected to resume or reach their K Level within 6  months. Patient was active before the amputation and independent with stairs, uneven terrain, varying cadence, and a community ambulator.  Patient is a K2 level ambulator that will use a prosthesis to walk around their home and the community over low level environmental barriers.        Imaging: No results found. No images are attached to the encounter.  Labs: Lab Results  Component Value Date   HGBA1C 5.4 02/01/2024   HGBA1C 5.7 (H) 07/19/2023   HGBA1C 6.1 (H) 03/30/2023   ESRSEDRATE 26 (H) 06/23/2022   ESRSEDRATE 40 (H) 01/13/2022   ESRSEDRATE 55 (H) 01/11/2022   CRP 14.0 (H) 06/23/2022   CRP 3.6 (H) 01/13/2022   CRP 4.2 (H) 01/11/2022   REPTSTATUS 02/16/2024 FINAL 02/13/2024   GRAMSTAIN  02/13/2024    FEW WBC PRESENT, PREDOMINANTLY PMN NO ORGANISMS SEEN    CULT  02/13/2024    NO GROWTH 3 DAYS Performed at [facility], [address], [place], [place] [address]    LABORGA GROUP B STREP(S.AGALACTIAE)ISOLATED 06/23/2022     Lab Results  Component Value Date   ALBUMIN  2.6 (L) 03/28/2024   ALBUMIN  2.2 (L) 03/27/2024   ALBUMIN  2.2 (L) 03/26/2024   PREALBUMIN 23 03/31/2023   PREALBUMIN 18 06/23/2022   PREALBUMIN 19.0 05/04/2016    Lab Results  Component Value Date   MG 1.8 02/21/2024   MG 2.3 02/18/2024   MG 2.2 02/16/2024   No results  found for: [facility]  Lab Results  Component Value Date   PREALBUMIN 23 03/31/2023   PREALBUMIN 18 06/23/2022   PREALBUMIN 19.0 05/04/2016      Latest Ref Rng & Units 03/27/2024    2:01 AM 03/26/2024    8:05 AM 03/24/2024    6:07 PM  CBC EXTENDED  WBC 4.0 - 10.5 K/uL 4.5  5.3  9.3   RBC 4.22 - 5.81 MIL/uL 3.37  3.46  3.22   Hemoglobin 13.0 - 17.0 g/dL 9.6  89.7  9.5   HCT 60.9 - 52.0 % 30.7  31.9  30.1   Platelets 150 - 400 K/uL 171  141  147   NEUT# 1.7 - 7.7 K/uL 2.5   7.6   Lymph# 0.7 - 4.0 K/uL 1.0   0.6      There is no height or weight on file to calculate BMI.  Orders:  No orders of the defined  types were placed in this encounter.  No orders of the defined types were placed in this encounter.    Procedures: No procedures performed  Clinical Data: No additional findings.  ROS:  All other systems negative, except as noted in the HPI. Review of Systems  Objective: Vital Signs: There were no vitals taken for this visit.  Specialty Comments:  No specialty comments available.  PMFS History: 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) 03/31/2023   Paroxysmal atrial fibrillation (HCC) 03/31/2023   Anemia due to chronic kidney disease 01/13/2022   Obesity (BMI 30-39.9) 01/13/2022   Proliferative diabetic retinopathy of left eye with macular edema associated with type 2 diabetes mellitus (HCC) 11/27/2021   Paroxysmal atrial fibrillation with RVR (HCC) 06/14/2021   Carotid stenosis 06/15/2020   Dyslipidemia 06/15/2020   Diabetic macular edema of right eye with proliferative retinopathy associated with type 2 diabetes mellitus (HCC) 01/18/2020   Severe nonproliferative diabetic retinopathy of left eye, with macular edema, associated with type 2 diabetes mellitus (HCC) 01/04/2020   Retinal hemorrhage of right eye 01/04/2020   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   S/P carotid endarterectomy 09/18/2018   OSA not tolerating CPAP 09/18/2018   Chronic pain 09/18/2018    Peripheral artery disease (HCC) 11/12/2016   Unilateral primary osteoarthritis, left knee 10/11/2016   Type 2 diabetes mellitus with diabetic neuropathy (HCC) 05/09/2016   CAD (dz of distal, mid and proximal RCA with implantation of 3 overlapping drug-eluting stent, with elevated troponin 09/03/2015   (HFpEF) heart failure with preserved ejection fraction (HCC) 09/03/2015   Hyperlipidemia 08/25/2014   Limb pain 03/20/2013   Restless legs syndrome (RLS) 09/25/2012   COPD (chronic obstructive pulmonary disease) (HCC) 04/25/2012   Hypokalemia 11/01/2011   Occlusion and stenosis of carotid artery without mention of cerebral infarction 06/12/2011   GERD (gastroesophageal reflux disease) 05/08/2011   Barrett's esophagus without dysplasia 05/08/2011   Former tobacco use 02/01/2011   Past Medical History:  Diagnosis Date  Carotid artery occlusion 11/10/2010   LEFT CAROTID ENDARTERECTOMY   Complication of anesthesia    BP WENT UP AT [facility]    COPD (chronic obstructive pulmonary disease) (HCC)    pt denies this dx as of 06/01/20 - no inhaler    Diabetes mellitus without complication (HCC)    Diverticulitis    Diverticulosis of colon (without mention of hemorrhage)    DJD (degenerative joint disease)    knees/hands/feet/back/neck   ESRD (end stage renal disease) on dialysis (HCC)    Fatty liver    Full dentures    GERD (gastroesophageal reflux disease)    H/O hiatal hernia    History of blood transfusion    with a past surical procedure per patient 06/01/20   History of right below knee amputation (HCC) 07/12/2016   Hyperlipidemia    Hypertension    Neuromuscular disorder (HCC)    peripheral neuropathy   Non-pressure chronic ulcer of other part of left foot limited to breakdown of skin (HCC) 11/12/2016   Nonrheumatic aortic (valve) stenosis 03/18/2024   Limited TTE 03/18/2024: EF 55-60, moderate LVH, normal RVSF, moderate LAE, mild MR, mild-moderate AS (mean 12, V-max 235 cm/s, DI 0.35),  aortic root 39 mm, moderate pleural effusion, trivial pericardial effusion    Osteomyelitis (HCC)    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 Atherectomy left popliteal artery July 2012.    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 Before age 71   Hyperlipidemia Brother    Hypertension Brother    Hypertension Son    Arthritis Other        GRANDMOTHER   Hypertension Other        OTHER FAMILY MEMBERS    Past Surgical History:  Procedure Laterality Date   ABDOMINAL AORTOGRAM W/LOWER EXTREMITY N/A 06/23/2021   Procedure: ABDOMINAL AORTOGRAM W/LOWER EXTREMITY;  Surgeon: Elmira Newman PARAS, MD;  Location: [facility];  Service: Cardiovascular;  Laterality: N/A;   AMPUTATION  11/05/2011   Procedure: AMPUTATION RAY;  Surgeon: Norleen Armor, MD;  Location: [facility] OR;  Service: Orthopedics;  Laterality: Right;  Amputation of Right 4&5th Toes   AMPUTATION Left 11/26/2012   Procedure: AMPUTATION RAY;  Surgeon: Norleen Armor, MD;  Location: [facility] OR;  Service: Orthopedics;  Laterality: Left;  fourth ray amputation   AMPUTATION Right  08/27/2014   Procedure: Transmetatarsal Amputation;  Surgeon:  Jerona Harden GAILS, MD;  Location: [facility] OR;  Service: Orthopedics;  Laterality: Right;   AMPUTATION Right 01/14/2015   Procedure: AMPUTATION BELOW KNEE;  Surgeon: Jerona Harden GAILS, MD;  Location: [facility] OR;  Service: Orthopedics;  Laterality: Right;   AMPUTATION Left 10/21/2015   Procedure: Left Foot 5th Ray Amputation;  Surgeon: Jerona Harden GAILS, MD;  Location: [facility] OR;  Service: Orthopedics;  Laterality: Left;   AMPUTATION Left 07/04/2022   Procedure: LEFT BELOW KNEE AMPUTATION;  Surgeon: Harden Jerona GAILS, MD;  Location: [facility] OR;  Service: Orthopedics;  Laterality: Left;   ANTERIOR FUSION CERVICAL SPINE  02/06/06   C4-5, C5-6, C6-7; SURGEON DR. MAX COHEN   AV FISTULA PLACEMENT Left 06/02/2020   Procedure: ARTERIOVENOUS (AV) FISTULA CREATION LEFT;  Surgeon: Sheree Penne Bruckner, MD;  Location: [facility] OR;  Service: Vascular;  Laterality: Left;   BACK SURGERY     x 3   BASCILIC VEIN TRANSPOSITION Left 07/21/2020   Procedure: LEFT UPPER ARM ATERIOVENOUS SUPERFISTULALIZATION;  Surgeon: Sheree Penne Bruckner, MD;  Location: [facility] OR;  Service: Vascular;  Laterality: Left;   BELOW KNEE LEG AMPUTATION Right    CARDIAC CATHETERIZATION  10/31/04   2009   CAROTID ENDARTERECTOMY  11/10/10   CAROTID ENDARTERECTOMY Left 11/10/2010   Subtotal occlusion of left internal carotid artery with left hemispheric transient ischemic attacks.   CAROTID STENT     CARPAL TUNNEL RELEASE Right 10/21/2013   Procedure: RIGHT CARPAL TUNNEL RELEASE;  Surgeon: Arley JONELLE Curia, MD;  Location: [facility];  Service: Orthopedics;  Laterality: Right;   CHOLECYSTECTOMY     COLON SURGERY     COLONOSCOPY     COLOSTOMY REVERSAL  05/21/2018   ileostomy reversal   CORONARY LITHOTRIPSY N/A 11/24/2023   Procedure: CORONARY LITHOTRIPSY;  Surgeon: Swaziland, Peter M, MD;  Location: [facility] INVASIVE CV LAB;  Service: Cardiovascular;  Laterality: N/A;   CORONARY STENT INTERVENTION N/A 11/24/2023   Procedure: CORONARY STENT INTERVENTION;  Surgeon: Swaziland, Peter  M, MD;  Location: [facility] INVASIVE CV LAB;  Service: Cardiovascular;  Laterality: N/A;   CYSTOSCOPY WITH STENT PLACEMENT Bilateral 01/13/2018   Procedure: CYSTOSCOPY WITH BILATERAL URETERAL CATHETER PLACEMENT;  Surgeon: Cam Morene ORN, MD;  Location: [facility] ORS;  Service: Urology;  Laterality: Bilateral;   ESOPHAGEAL MANOMETRY Bilateral 07/19/2014   Procedure: ESOPHAGEAL MANOMETRY (EM);  Surgeon: Gordy CHRISTELLA Starch, MD;  Location: [facility];  Service: Gastroenterology;  Laterality: Bilateral;   EYE SURGERY Bilateral 2020   cataract   FEMORAL ARTERY STENT     x6   FINGER SURGERY     FOOT SURGERY  04/25/2016    EXCISION BASE 5TH METATARSAL AND PARTIAL CUBOID LEFT FOOT   HERNIA REPAIR     LEFT INGUINAL AND UMBILICAL REPAIRS   HERNIA REPAIR     I & D EXTREMITY Left 04/25/2016   Procedure: EXCISION BASE 5TH METATARSAL AND PARTIAL CUBOID LEFT FOOT;  Surgeon: Jerona GAILS Harden, MD;  Location: [facility];  Service: Orthopedics;  Laterality: Left;   ILEOSTOMY  01/13/2018   Procedure: ILEOSTOMY;  Surgeon: Signe Mitzie LABOR, MD;  Location: [facility]S;  Service: General;;   ILEOSTOMY CLOSURE N/A 05/21/2018   Procedure: ILEOSTOMY REVERSAL ERAS PATHWAY;  Surgeon: Signe Mitzie LABOR, MD;  Location: [facility] OR;  Service: General;  Laterality: N/A;   IR RADIOLOGIST EVAL & MGMT  11/19/2017   IR RADIOLOGIST EVAL & MGMT  12/03/2017   IR RADIOLOGIST EVAL & MGMT  12/18/2017   JOINT REPLACEMENT Right 2001   Total knee   LAMINECTOMY     X 3 LUMBAR AND X 2 CERVICAL SPINE OPERATIONS   LAPAROSCOPIC CHOLECYSTECTOMY W/ CHOLANGIOGRAPHY  11/09/04   SURGEON DR. DEWARD CANDIE NULL   LEFT HEART CATH AND CORONARY ANGIOGRAPHY N/A 09/16/2018   Procedure: LEFT HEART CATH AND CORONARY ANGIOGRAPHY;  Surgeon: Elmira Newman PARAS, MD;  [facility];  Service: Cardiovascular;  Laterality: N/A;   LEFT HEART CATH AND CORONARY ANGIOGRAPHY N/A 11/24/2023   Procedure: LEFT HEART CATH AND CORONARY ANGIOGRAPHY;  Surgeon: Swaziland, Peter M, MD;  Location: [facility]  INVASIVE CV LAB;  Service: Cardiovascular;  Laterality: N/A;   LEFT HEART CATHETERIZATION WITH CORONARY ANGIOGRAM N/A 10/29/2014   Procedure: LEFT HEART CATHETERIZATION WITH CORONARY ANGIOGRAM;  Surgeon: Erick JONELLE Bergamo, MD;  Location: [facility] CATH LAB;  Service: Cardiovascular;  Laterality: N/A;   LIGATION OF COMPETING BRANCHES OF ARTERIOVENOUS FISTULA Left 07/21/2020   Procedure: LIGATION OF COMPETING BRANCHES OF LEFT UPPER ARM ARTERIOVENOUS FISTULA;  Surgeon: Sheree Penne Bruckner, MD;  Location: [facility] OR;  Service: Vascular;  Laterality: Left;   LOWER EXTREMITY ANGIOGRAM N/A 03/19/2012   Procedure: LOWER EXTREMITY ANGIOGRAM;  Surgeon: Bruckner JONETTA Cash, MD;  Location: [facility] CATH LAB;  Service: Cardiovascular;  Laterality: N/A;   LOWER EXTREMITY ANGIOGRAPHY N/A 06/20/2021   Procedure: LOWER EXTREMITY ANGIOGRAPHY;  Surgeon: Elmira Newman PARAS, MD;  Location[facility];  Service: Cardiovascular;  Laterality: N/A;   NECK SURGERY     PARTIAL COLECTOMY N/A 01/13/2018   Procedure: LAPAROSCOPIC ASSISTED   SIGMOID COLECTOMY ILEOSTOMY;  Surgeon: Signe Mitzie LABOR, MD;  Location: [facility]S;  Service: General;  Laterality: N/A;   PENILE PROSTHESIS IMPLANT  08/14/05   INFRAPUBIC INSERTION OF INFLATABLE PENILE PROSTHESIS; SURGEON DR. JANIT   PENILE PROSTHESIS IMPLANT     PERCUTANEOUS CORONARY STENT INTERVENTION (PCI-S) Right 10/29/2014   Procedure: PERCUTANEOUS CORONARY STENT INTERVENTION (PCI-S);  Surgeon: Erick JONELLE Bergamo, MD;  Location: [facility] CATH LAB;  Service: Cardiovascular;  Laterality: Right;   PERICARDIOCENTESIS N/A 02/13/2024   Procedure: PERICARDIOCENTESIS;  Surgeon: Elmira Newman PARAS, MD;  [facility];  Service: Cardiovascular;  Laterality: N/A;   PERIPHERAL VASCULAR INTERVENTION Left 06/23/2021   Procedure: PERIPHERAL VASCULAR INTERVENTION;  Surgeon: Elmira Newman PARAS, MD;  Location[facility];  Service: Cardiovascular;  Laterality: Left;   REMOVAL OF PENILE  PROSTHESIS N/A 06/14/2021   Procedure: Removal of THREE piece inflatable penile prosthesis;  Surgeon: Carolee Sherwood JONETTA DOUGLAS, MD;  Location: [facility] OR;  Service: Urology;  Laterality: N/A;   SHOULDER ARTHROSCOPY     SPINE SURGERY     TEMPORARY PACEMAKER N/A 02/13/2024   Procedure: TEMPORARY PACEMAKER;  Surgeon: Elmira Newman PARAS, MD;  Location: [facility];  Service: Cardiovascular;  Laterality: N/A;   TOE AMPUTATION Left    TONSILLECTOMY     TOTAL KNEE ARTHROPLASTY  07/2002   RIGHT KNEE ; SURGEON  DR. [name] ALSO HAD ARTHROSCOPIC RIGHT KNEE IN  10/2001   TOTAL KNEE ARTHROPLASTY     ULNAR NERVE TRANSPOSITION Right 10/21/2013   Procedure: RIGHT ELBOW  ULNAR NERVE DECOMPRESSION;  Surgeon: Arley JONELLE Curia, MD;  Location: [facility];  Service: Orthopedics;  Laterality: Right;   Social History   Occupational History   Occupation: Publishing rights manager: UNEMPLOYED  Tobacco Use   Smoking status: Every Day    Current packs/day: 1.00    Average packs/day:  1 pack/day for 35.0 years (35.0 ttl pk-yrs)    Types: Cigarettes   Smokeless tobacco: Never  Vaping Use   Vaping status: Every Day   Substances: Nicotine   Substance and Sexual Activity   Alcohol  use: Not Currently    Comment: not in a long time   Drug use: Never   Sexual activity: Yes    Birth control/protection: Implant    Comment: penile implant

## 2024-04-27 DIAGNOSIS — M1990 Unspecified osteoarthritis, unspecified site: Secondary | ICD-10-CM | POA: Diagnosis not present

## 2024-04-27 DIAGNOSIS — G8929 Other chronic pain: Secondary | ICD-10-CM | POA: Diagnosis not present

## 2024-04-30 DIAGNOSIS — S61211A Laceration without foreign body of left index finger without damage to nail, initial encounter: Secondary | ICD-10-CM | POA: Diagnosis not present

## 2024-04-30 DIAGNOSIS — Z91158 Patient's noncompliance with renal dialysis for other reason: Secondary | ICD-10-CM | POA: Diagnosis not present

## 2024-04-30 DIAGNOSIS — N186 End stage renal disease: Secondary | ICD-10-CM | POA: Diagnosis not present

## 2024-05-04 DIAGNOSIS — Z89511 Acquired absence of right leg below knee: Secondary | ICD-10-CM | POA: Diagnosis not present

## 2024-05-04 DIAGNOSIS — Z89512 Acquired absence of left leg below knee: Secondary | ICD-10-CM | POA: Diagnosis not present

## 2024-05-07 DIAGNOSIS — Z992 Dependence on renal dialysis: Secondary | ICD-10-CM | POA: Diagnosis not present

## 2024-05-07 DIAGNOSIS — E11628 Type 2 diabetes mellitus with other skin complications: Secondary | ICD-10-CM | POA: Diagnosis not present

## 2024-05-07 DIAGNOSIS — N186 End stage renal disease: Secondary | ICD-10-CM | POA: Diagnosis not present

## 2024-05-07 DIAGNOSIS — G8929 Other chronic pain: Secondary | ICD-10-CM | POA: Diagnosis not present

## 2024-05-10 DIAGNOSIS — Z992 Dependence on renal dialysis: Secondary | ICD-10-CM | POA: Diagnosis not present

## 2024-05-10 DIAGNOSIS — E1122 Type 2 diabetes mellitus with diabetic chronic kidney disease: Secondary | ICD-10-CM | POA: Diagnosis not present

## 2024-05-10 DIAGNOSIS — N186 End stage renal disease: Secondary | ICD-10-CM | POA: Diagnosis not present

## 2024-05-15 ENCOUNTER — Ambulatory Visit: Admitting: Cardiology

## 2024-05-18 DIAGNOSIS — Z89512 Acquired absence of left leg below knee: Secondary | ICD-10-CM | POA: Diagnosis not present

## 2024-05-18 DIAGNOSIS — M6281 Muscle weakness (generalized): Secondary | ICD-10-CM | POA: Diagnosis not present

## 2024-05-18 DIAGNOSIS — Z993 Dependence on wheelchair: Secondary | ICD-10-CM | POA: Diagnosis not present

## 2024-05-20 ENCOUNTER — Ambulatory Visit (INDEPENDENT_AMBULATORY_CARE_PROVIDER_SITE_OTHER): Admitting: Orthopedic Surgery

## 2024-05-20 DIAGNOSIS — M720 Palmar fascial fibromatosis [Dupuytren]: Secondary | ICD-10-CM | POA: Diagnosis not present

## 2024-05-20 DIAGNOSIS — E11628 Type 2 diabetes mellitus with other skin complications: Secondary | ICD-10-CM | POA: Diagnosis not present

## 2024-05-20 DIAGNOSIS — Z89512 Acquired absence of left leg below knee: Secondary | ICD-10-CM | POA: Diagnosis not present

## 2024-05-20 DIAGNOSIS — Z993 Dependence on wheelchair: Secondary | ICD-10-CM | POA: Diagnosis not present

## 2024-05-20 NOTE — Progress Notes (Signed)
 [name] - 68 y.o. male MRN [id number]  Date of birth: 1956/04/16  Office Visit Note: Visit Date: 05/20/2024 PCP: Montey Lot, PA-C Referred by: Montey Lot, PA-C  Subjective: No chief complaint on file.  HPI: [name] is a pleasant 68 y.o. male who presents today for evaluation of bilateral Dupuytren's contracture of the hands.  Symptoms have been present for multiple years, contractures affecting all digits index through small bilaterally.  Unsure of any prior family history.  No significant inventions have been tried.  Pertinent ROS were reviewed with the patient and found to be negative unless otherwise specified above in HPI.   Visit Reason:Bilateral dupuytren contracture Duration of symptoms: years Hand dominance: right Occupation:disabled  Diabetic: Endocrine Type 2 diabetes mellitus with diabetic neuropathy (HCC) Severe nonproliferative diabetic retinopathy of left eye, with macular edema, associated with type 2 diabetes mellitus (HCC) Diabetic macular edema of right eye with proliferative retinopathy associated with type 2 diabetes mellitus (HCC) Proliferative diabetic retinopathy of left eye with macular edema associated with type 2 diabetes mellitus (HCC) Smoking: Yes-1/2 to 1 pack a day Heart/Lung History:CAD (dz of distal, mid and proximal RCA with implantation of 3 overlapping drug-eluting stent, with elevated troponin  (HFpEF) heart failure with preserved ejection fraction (HCC)  Peripheral artery disease (HCC) Occlusion and stenosis of carotid artery without mention of cerebral infarction Trifascicular block Carotid stenosis Paroxysmal atrial fibrillation with RVR (HCC) Paroxysmal atrial fibrillation (HCC) Nonrheumatic aortic (valve) stenosis  Respiratory OSA not tolerating CPAP COPD (chronic obstructive pulmonary disease) (HCC) Blood Thinners: Plavix   Prior Testing/EMG:none Injections (Date):none Treatments:none Prior Surgery: Right CTR  by Kuzma on 10/21/13  Assessment & Plan: Visit Diagnoses:  1. Dupuytren contracture of both hands     Plan: Extensive discussion was had with the patient today regarding his bilateral hand Dupuytren's disease.  We discussed the underlying etiology and pathophysiology of this condition as well as all treatment modalities ranging from conservative to surgical.  From a conservative standpoint, we discussed ongoing observation, splinting, therapy and activity modification.  We also discussed the potential for collagenase  injection as well as the appropriate protocol and timeframe for recovery.  From a surgical standpoint, we discussed fasciectomy as well as all risks and benefits associated.  Understanding the options, patient would like to begin with splinting and stretching with occupational therapy to minimize risk which is appropriate.  In the future, we could consider potential Xiaflex injection should we begin to isolate specific digits to treat.  Risks and benefits of the Xiaflex injection were discussed in detail as well as postprocedural splinting and occupational therapy.  For now, they will begin with OT for splinting and stretching exercises given the extensive comorbidities.  Return as needed.  Follow-up: No follow-ups on file.   Meds & Orders: No orders of the defined types were placed in this encounter.   Orders Placed This Encounter  Procedures   Ambulatory referral to Occupational Therapy     Procedures: No procedures performed      Clinical History: No specialty comments available.  He reports that he has been smoking cigarettes. He has a 35 pack-year smoking history. He has never used smokeless tobacco.  Recent Labs    07/19/23 0537 02/01/24 1119  HGBA1C 5.7* 5.4    Objective:   Vital Signs: There were no vitals taken for this visit.  Physical Exam  Gen: Well-appearing, in no acute distress; non-toxic CV: Regular Rate. Well-perfused. Warm.  Resp: Breathing  unlabored on room  air; no wheezing. Psych: Fluid speech in conversation; appropriate affect; normal thought process  Ortho Exam Right hand: - Positive tabletop test, palpable cords in line with the long, ring and small finger, contractures notable to the MP and PIP regions, minimal passive correction  Left hand: - Positive tabletop test, palpable cords in line with the long, ring and small finger, contraction noted at the MP and PIP regions, minimal passive correction  Imaging: No results found.  Past Medical/Family/Surgical/Social History: Medications & Allergies reviewed per EMR, new medications updated. 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) 03/31/2023   Paroxysmal atrial fibrillation (HCC) 03/31/2023   Anemia due to chronic kidney disease 01/13/2022   Obesity (BMI 30-39.9) 01/13/2022   Proliferative diabetic retinopathy of left eye with macular edema associated with type 2 diabetes mellitus (HCC) 11/27/2021   Paroxysmal atrial fibrillation with RVR (HCC) 06/14/2021   Carotid stenosis 06/15/2020   Dyslipidemia 06/15/2020   Diabetic macular edema of right eye with proliferative retinopathy associated with type 2 diabetes mellitus (HCC) 01/18/2020   Severe nonproliferative diabetic retinopathy of left eye, with macular edema, associated with type 2 diabetes mellitus  (HCC) 01/04/2020   Retinal hemorrhage of right eye 01/04/2020   Trifascicular block 11/15/2018   S/P carotid endarterectomy 09/18/2018   OSA not tolerating CPAP 09/18/2018   Chronic pain 09/18/2018   Peripheral artery disease (HCC) 11/12/2016   Unilateral primary osteoarthritis, left knee 10/11/2016   Type 2 diabetes mellitus with diabetic neuropathy (HCC) 05/09/2016   CAD (dz of distal, mid and proximal RCA with implantation of 3 overlapping drug-eluting stent, with elevated troponin 09/03/2015   (HFpEF) heart failure with preserved ejection fraction (HCC) 09/03/2015   Hyperlipidemia 08/25/2014   Limb pain 03/20/2013   Restless legs syndrome (RLS) 09/25/2012   COPD (chronic obstructive pulmonary disease) (HCC) 04/25/2012   Hypokalemia 11/01/2011   Occlusion and stenosis of carotid artery without mention of cerebral infarction 06/12/2011   GERD (gastroesophageal reflux disease) 05/08/2011   Barrett's esophagus without dysplasia 05/08/2011   Former tobacco use 02/01/2011   Past Medical History:  Diagnosis Date   Carotid artery occlusion 11/10/2010   LEFT CAROTID ENDARTERECTOMY   Complication of anesthesia    BP WENT UP AT [facility]    COPD (chronic obstructive pulmonary disease) (HCC)    pt denies this dx as of 06/01/20 - no inhaler    Diabetes mellitus without complication (HCC)    Diverticulitis    Diverticulosis of colon (without mention of hemorrhage)    DJD (degenerative joint disease)    knees/hands/feet/back/neck   ESRD (end stage renal disease) on dialysis (HCC)    Fatty liver    Full dentures    GERD (gastroesophageal reflux disease)    H/O hiatal hernia    History of blood transfusion    with a past surical procedure per patient 06/01/20   History of right below knee amputation (HCC) 07/12/2016   Hyperlipidemia  Hypertension    Neuromuscular disorder (HCC)    peripheral neuropathy   Non-pressure chronic ulcer of other part of left foot limited to breakdown of skin  (HCC) 11/12/2016   Nonrheumatic aortic (valve) stenosis 03/18/2024   Limited TTE 03/18/2024: EF 55-60, moderate LVH, normal RVSF, moderate LAE, mild MR, mild-moderate AS (mean 12, V-max 235 cm/s, DI 0.35), aortic root 39 mm, moderate pleural effusion, trivial pericardial effusion    Osteomyelitis (HCC)    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 Atherectomy left popliteal artery July 2012.    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   Unstable angina (HCC) 09/16/2018   Wears glasses    Family History  Problem Relation Age of Onset   Heart disease Father        Before age 109-  CAD, BPG   Diabetes Father        Amputation   Cancer Father        PROSTATE   Hyperlipidemia Father    Hypertension Father    Heart attack Father        Triple BPG   Varicose Veins Father    Cancer Sister        Breast   Hyperlipidemia Sister    Hypertension Sister    Heart attack Brother    Colon cancer Brother    Diabetes Brother    Heart disease Brother 27       A-Fib. Before age 64   Hyperlipidemia Brother    Hypertension Brother    Hypertension Son    Arthritis Other        GRANDMOTHER   Hypertension Other        OTHER FAMILY MEMBERS   Past Surgical History:  Procedure Laterality Date   ABDOMINAL AORTOGRAM W/LOWER EXTREMITY N/A 06/23/2021   Procedure: ABDOMINAL AORTOGRAM W/LOWER EXTREMITY;  Surgeon: Elmira Newman PARAS, MD;  [facility]: [facility];  Service: Cardiovascular;  Laterality: N/A;   AMPUTATION  11/05/2011   Procedure: AMPUTATION RAY;  Surgeon: Norleen Armor, MD;  Location: [facility] OR;  Service: Orthopedics;  Laterality: Right;  Amputation of Right 4&5th Toes   AMPUTATION Left 11/26/2012   Procedure:  AMPUTATION RAY;  Surgeon: Norleen Armor, MD;  Location: [facility] OR;  Service: Orthopedics;  Laterality: Left;  fourth ray amputation   AMPUTATION Right 08/27/2014   Procedure: Transmetatarsal Amputation;  Surgeon: Jerona Harden GAILS, MD;  [facility] OR;  Service: Orthopedics;  Laterality: Right;   AMPUTATION Right 01/14/2015   Procedure: AMPUTATION BELOW KNEE;  Surgeon: Jerona Harden GAILS, MD;  Location: [facility] OR;  Service: Orthopedics;  Laterality: Right;   AMPUTATION Left 10/21/2015   Procedure: Left Foot 5th Ray Amputation;  Surgeon: Jerona Harden GAILS, MD;  Location: [facility] OR;  Service: Orthopedics;  Laterality: Left;   AMPUTATION Left 07/04/2022   Procedure: LEFT BELOW KNEE AMPUTATION;  Surgeon: Harden Jerona GAILS, MD;  Location: [facility] OR;  Service: Orthopedics;  Laterality: Left;   ANTERIOR FUSION CERVICAL SPINE  02/06/06   C4-5, C5-6, C6-7; SURGEON DR. MAX COHEN   AV FISTULA PLACEMENT Left 06/02/2020   Procedure: ARTERIOVENOUS (AV) FISTULA CREATION LEFT;  Surgeon: Sheree Penne Bruckner, MD;  Location: [facility] OR;  Service: Vascular;  Laterality: Left;   BACK SURGERY     x 3   BASCILIC VEIN TRANSPOSITION Left 07/21/2020   Procedure: LEFT UPPER ARM ATERIOVENOUS SUPERFISTULALIZATION;  Surgeon: Sheree Penne Bruckner, MD;  Location: [facility] OR;  Service: Vascular;  Laterality: Left;   BELOW KNEE LEG AMPUTATION Right    CARDIAC CATHETERIZATION  10/31/04   2009   CAROTID ENDARTERECTOMY  11/10/10   CAROTID ENDARTERECTOMY Left 11/10/2010   Subtotal occlusion of left internal carotid artery with left hemispheric transient ischemic attacks.   CAROTID STENT     CARPAL TUNNEL RELEASE Right 10/21/2013   Procedure: RIGHT CARPAL TUNNEL RELEASE;  Surgeon: Arley JONELLE Curia, MD;  Location: [facility];  Service: Orthopedics;  Laterality: Right;   CHOLECYSTECTOMY     COLON SURGERY     COLONOSCOPY     COLOSTOMY REVERSAL  05/21/2018   ileostomy reversal   CORONARY LITHOTRIPSY N/A 11/24/2023   Procedure: CORONARY LITHOTRIPSY;  Surgeon:  Swaziland, Peter M, MD;  Location: [facility] INVASIVE CV LAB;  Service: Cardiovascular;  Laterality: N/A;   CORONARY STENT INTERVENTION N/A 11/24/2023   Procedure: CORONARY STENT INTERVENTION;  Surgeon: Swaziland, Peter M, MD;  Location: [facility] INVASIVE CV LAB;  Service: Cardiovascular;  Laterality: N/A;   CYSTOSCOPY WITH STENT PLACEMENT Bilateral 01/13/2018   Procedure: CYSTOSCOPY WITH BILATERAL URETERAL CATHETER PLACEMENT;  Surgeon: Cam Morene ORN, MD;  Location: [facility] ORS;  Service: Urology;  Laterality: Bilateral;   ESOPHAGEAL MANOMETRY Bilateral 07/19/2014   Procedure: ESOPHAGEAL MANOMETRY (EM);  Surgeon: Gordy CHRISTELLA Starch, MD;  Location: [facility];  Service: Gastroenterology;  Laterality: Bilateral;   EYE SURGERY Bilateral 2020   cataract   FEMORAL ARTERY STENT     x6   FINGER SURGERY     FOOT SURGERY  04/25/2016    EXCISION BASE 5TH METATARSAL AND PARTIAL CUBOID LEFT FOOT   HERNIA REPAIR     LEFT INGUINAL AND UMBILICAL REPAIRS   HERNIA REPAIR     I & D EXTREMITY Left 04/25/2016   Procedure: EXCISION BASE 5TH METATARSAL AND PARTIAL CUBOID LEFT FOOT;  Surgeon: Jerona LULLA Sage, MD;  Location: [facility] OR;  Service: Orthopedics;  Laterality: Left;   ILEOSTOMY  01/13/2018   Procedure: ILEOSTOMY;  Surgeon: Signe Mitzie LABOR, MD;  Location: [facility];  Service: General;;   ILEOSTOMY CLOSURE N/A 05/21/2018   Procedure: ILEOSTOMY REVERSAL ERAS PATHWAY;  Surgeon: Signe Mitzie LABOR, MD;  Location: [facility];  Service: General;  Laterality: N/A;   IR RADIOLOGIST EVAL & MGMT  11/19/2017   IR RADIOLOGIST EVAL & MGMT  12/03/2017   IR RADIOLOGIST EVAL & MGMT  12/18/2017   JOINT REPLACEMENT Right 2001   Total knee   LAMINECTOMY     X 3 LUMBAR AND X 2 CERVICAL SPINE OPERATIONS   LAPAROSCOPIC CHOLECYSTECTOMY W/ CHOLANGIOGRAPHY  11/09/04   SURGEON DR. DEWARD CANDIE NULL   LEFT HEART CATH AND CORONARY ANGIOGRAPHY N/A 09/16/2018   Procedure: LEFT HEART CATH AND CORONARY ANGIOGRAPHY;  Surgeon: Elmira Newman PARAS, MD;  [facility];   Service: Cardiovascular;  Laterality: N/A;   LEFT HEART CATH AND CORONARY ANGIOGRAPHY N/A 11/24/2023   Procedure: LEFT HEART CATH AND CORONARY ANGIOGRAPHY;  Surgeon: Swaziland, Peter M, MD;  Location: [facility] INVASIVE CV LAB;  Service: Cardiovascular;  Laterality: N/A;   LEFT HEART CATHETERIZATION WITH CORONARY ANGIOGRAM N/A 10/29/2014   Procedure: LEFT HEART CATHETERIZATION WITH CORONARY ANGIOGRAM;  Surgeon: Erick JONELLE Bergamo, MD;  Location: [facility]  CATH LAB;  Service: Cardiovascular;  Laterality: N/A;   LIGATION OF COMPETING BRANCHES OF ARTERIOVENOUS FISTULA Left 07/21/2020   Procedure: LIGATION OF COMPETING BRANCHES OF LEFT UPPER ARM ARTERIOVENOUS FISTULA;  Surgeon: Sheree Penne Bruckner, MD;  Location: [facility] OR;  Service: Vascular;  Laterality: Left;   LOWER EXTREMITY ANGIOGRAM N/A 03/19/2012   Procedure: LOWER EXTREMITY ANGIOGRAM;  Surgeon: Bruckner JONETTA Cash, MD;  Location: [facility] CATH LAB;  Service: Cardiovascular;  Laterality: N/A;   LOWER EXTREMITY ANGIOGRAPHY N/A 06/20/2021   Procedure: LOWER EXTREMITY ANGIOGRAPHY;  Surgeon: Elmira Newman PARAS, MD;  [facility];  Service: Cardiovascular;  Laterality: N/A;   NECK SURGERY     PARTIAL COLECTOMY N/A 01/13/2018   Procedure: LAPAROSCOPIC ASSISTED   SIGMOID COLECTOMY ILEOSTOMY;  Surgeon: Signe Mitzie LABOR, MD;  Location: [facility];  Service: General;  Laterality: N/A;   PENILE PROSTHESIS IMPLANT  08/14/05   INFRAPUBIC INSERTION OF INFLATABLE PENILE PROSTHESIS; SURGEON DR. JANIT   PENILE PROSTHESIS IMPLANT     PERCUTANEOUS CORONARY STENT INTERVENTION (PCI-S) Right 10/29/2014   Procedure: PERCUTANEOUS CORONARY STENT INTERVENTION (PCI-S);  Surgeon: Erick JONELLE Bergamo, MD;  Location: [facility] CATH LAB;  Service: Cardiovascular;  Laterality: Right;   PERICARDIOCENTESIS N/A 02/13/2024   Procedure: PERICARDIOCENTESIS;  Surgeon: Elmira Newman PARAS, MD;  [facility]: [facility];  Service: Cardiovascular;  Laterality: N/A;   PERIPHERAL VASCULAR INTERVENTION  Left 06/23/2021   Procedure: PERIPHERAL VASCULAR INTERVENTION;  Surgeon: Elmira Newman PARAS, MD;  [facility];  Service: Cardiovascular;  Laterality: Left;   REMOVAL OF PENILE PROSTHESIS N/A 06/14/2021   Procedure: Removal of THREE piece inflatable penile prosthesis;  Surgeon: Carolee Sherwood JONETTA DOUGLAS, MD;  Location: [facility] OR;  Service: Urology;  Laterality: N/A;   SHOULDER ARTHROSCOPY     SPINE SURGERY     TEMPORARY PACEMAKER N/A 02/13/2024   Procedure: TEMPORARY PACEMAKER;  Surgeon: Elmira Newman PARAS, MD;  [facility]: [facility];  Service: Cardiovascular;  Laterality: N/A;   TOE AMPUTATION Left    TONSILLECTOMY     TOTAL KNEE ARTHROPLASTY  07/2002   RIGHT KNEE ; SURGEON  DR. [name] ALSO HAD ARTHROSCOPIC RIGHT KNEE IN  10/2001   TOTAL KNEE ARTHROPLASTY     ULNAR NERVE TRANSPOSITION Right 10/21/2013   Procedure: RIGHT ELBOW  ULNAR NERVE DECOMPRESSION;  Surgeon: Arley JONELLE Curia, MD;  Location: [facility];  Service: Orthopedics;  Laterality: Right;   Social History   Occupational History   Occupation: Publishing rights manager: UNEMPLOYED  Tobacco Use   Smoking status: Every Day    Current packs/day: 1.00    Average packs/day: 1 pack/day for 35.0 years (35.0 ttl pk-yrs)    Types: Cigarettes   Smokeless tobacco: Never  Vaping Use   Vaping status: Every Day   Substances: Nicotine   Substance and Sexual Activity   Alcohol  use: Not Currently    Comment: not in a long time   Drug use: Never   Sexual activity: Yes    Birth control/protection: Implant    Comment: penile implant    Ravin Denardo Afton Alderton, M.D. [facility], Hand Surgery

## 2024-05-22 DIAGNOSIS — Z993 Dependence on wheelchair: Secondary | ICD-10-CM | POA: Diagnosis not present

## 2024-05-22 DIAGNOSIS — Z89512 Acquired absence of left leg below knee: Secondary | ICD-10-CM | POA: Diagnosis not present

## 2024-05-22 DIAGNOSIS — M6281 Muscle weakness (generalized): Secondary | ICD-10-CM | POA: Diagnosis not present

## 2024-05-22 DIAGNOSIS — E11628 Type 2 diabetes mellitus with other skin complications: Secondary | ICD-10-CM | POA: Diagnosis not present

## 2024-05-25 DIAGNOSIS — Z993 Dependence on wheelchair: Secondary | ICD-10-CM | POA: Diagnosis not present

## 2024-05-25 DIAGNOSIS — I10 Essential (primary) hypertension: Secondary | ICD-10-CM | POA: Diagnosis not present

## 2024-05-25 DIAGNOSIS — G8929 Other chronic pain: Secondary | ICD-10-CM | POA: Diagnosis not present

## 2024-05-25 DIAGNOSIS — Z89512 Acquired absence of left leg below knee: Secondary | ICD-10-CM | POA: Diagnosis not present

## 2024-05-25 DIAGNOSIS — Z79899 Other long term (current) drug therapy: Secondary | ICD-10-CM | POA: Diagnosis not present

## 2024-05-25 DIAGNOSIS — M6281 Muscle weakness (generalized): Secondary | ICD-10-CM | POA: Diagnosis not present

## 2024-05-25 DIAGNOSIS — M1990 Unspecified osteoarthritis, unspecified site: Secondary | ICD-10-CM | POA: Diagnosis not present

## 2024-05-25 DIAGNOSIS — E113512 Type 2 diabetes mellitus with proliferative diabetic retinopathy with macular edema, left eye: Secondary | ICD-10-CM | POA: Diagnosis not present

## 2024-05-26 DIAGNOSIS — I5032 Chronic diastolic (congestive) heart failure: Secondary | ICD-10-CM | POA: Diagnosis not present

## 2024-05-26 DIAGNOSIS — J449 Chronic obstructive pulmonary disease, unspecified: Secondary | ICD-10-CM | POA: Diagnosis not present

## 2024-05-26 DIAGNOSIS — I10 Essential (primary) hypertension: Secondary | ICD-10-CM | POA: Diagnosis not present

## 2024-05-27 DIAGNOSIS — Z89512 Acquired absence of left leg below knee: Secondary | ICD-10-CM | POA: Diagnosis not present

## 2024-05-27 DIAGNOSIS — R0989 Other specified symptoms and signs involving the circulatory and respiratory systems: Secondary | ICD-10-CM | POA: Diagnosis not present

## 2024-05-27 DIAGNOSIS — R062 Wheezing: Secondary | ICD-10-CM | POA: Diagnosis not present

## 2024-05-27 DIAGNOSIS — Z993 Dependence on wheelchair: Secondary | ICD-10-CM | POA: Diagnosis not present

## 2024-05-27 DIAGNOSIS — M6281 Muscle weakness (generalized): Secondary | ICD-10-CM | POA: Diagnosis not present

## 2024-05-29 DIAGNOSIS — M6281 Muscle weakness (generalized): Secondary | ICD-10-CM | POA: Diagnosis not present

## 2024-05-29 DIAGNOSIS — Z89512 Acquired absence of left leg below knee: Secondary | ICD-10-CM | POA: Diagnosis not present

## 2024-05-29 DIAGNOSIS — Z993 Dependence on wheelchair: Secondary | ICD-10-CM | POA: Diagnosis not present

## 2024-05-29 DIAGNOSIS — E11628 Type 2 diabetes mellitus with other skin complications: Secondary | ICD-10-CM | POA: Diagnosis not present

## 2024-06-01 DIAGNOSIS — E11628 Type 2 diabetes mellitus with other skin complications: Secondary | ICD-10-CM | POA: Diagnosis not present

## 2024-06-01 DIAGNOSIS — Z89512 Acquired absence of left leg below knee: Secondary | ICD-10-CM | POA: Diagnosis not present

## 2024-06-01 DIAGNOSIS — M6281 Muscle weakness (generalized): Secondary | ICD-10-CM | POA: Diagnosis not present

## 2024-06-01 DIAGNOSIS — Z993 Dependence on wheelchair: Secondary | ICD-10-CM | POA: Diagnosis not present

## 2024-06-03 DIAGNOSIS — M6281 Muscle weakness (generalized): Secondary | ICD-10-CM | POA: Diagnosis not present

## 2024-06-03 DIAGNOSIS — E11628 Type 2 diabetes mellitus with other skin complications: Secondary | ICD-10-CM | POA: Diagnosis not present

## 2024-06-03 DIAGNOSIS — Z993 Dependence on wheelchair: Secondary | ICD-10-CM | POA: Diagnosis not present

## 2024-06-03 DIAGNOSIS — Z89512 Acquired absence of left leg below knee: Secondary | ICD-10-CM | POA: Diagnosis not present

## 2024-06-04 DIAGNOSIS — E11628 Type 2 diabetes mellitus with other skin complications: Secondary | ICD-10-CM | POA: Diagnosis not present

## 2024-06-04 DIAGNOSIS — M6281 Muscle weakness (generalized): Secondary | ICD-10-CM | POA: Diagnosis not present

## 2024-06-04 DIAGNOSIS — Z993 Dependence on wheelchair: Secondary | ICD-10-CM | POA: Diagnosis not present

## 2024-06-04 DIAGNOSIS — Z89512 Acquired absence of left leg below knee: Secondary | ICD-10-CM | POA: Diagnosis not present

## 2024-06-05 DIAGNOSIS — Z89512 Acquired absence of left leg below knee: Secondary | ICD-10-CM | POA: Diagnosis not present

## 2024-06-05 DIAGNOSIS — Z993 Dependence on wheelchair: Secondary | ICD-10-CM | POA: Diagnosis not present

## 2024-06-05 DIAGNOSIS — M6281 Muscle weakness (generalized): Secondary | ICD-10-CM | POA: Diagnosis not present

## 2024-06-06 DIAGNOSIS — Z993 Dependence on wheelchair: Secondary | ICD-10-CM | POA: Diagnosis not present

## 2024-06-06 DIAGNOSIS — M6281 Muscle weakness (generalized): Secondary | ICD-10-CM | POA: Diagnosis not present

## 2024-06-06 DIAGNOSIS — Z89512 Acquired absence of left leg below knee: Secondary | ICD-10-CM | POA: Diagnosis not present

## 2024-06-08 DIAGNOSIS — G8929 Other chronic pain: Secondary | ICD-10-CM | POA: Diagnosis not present

## 2024-06-08 DIAGNOSIS — Z89512 Acquired absence of left leg below knee: Secondary | ICD-10-CM | POA: Diagnosis not present

## 2024-06-08 DIAGNOSIS — Z993 Dependence on wheelchair: Secondary | ICD-10-CM | POA: Diagnosis not present

## 2024-06-08 DIAGNOSIS — M6281 Muscle weakness (generalized): Secondary | ICD-10-CM | POA: Diagnosis not present

## 2024-06-08 DIAGNOSIS — M159 Polyosteoarthritis, unspecified: Secondary | ICD-10-CM | POA: Diagnosis not present

## 2024-06-09 DIAGNOSIS — Z993 Dependence on wheelchair: Secondary | ICD-10-CM | POA: Diagnosis not present

## 2024-06-09 DIAGNOSIS — Z89512 Acquired absence of left leg below knee: Secondary | ICD-10-CM | POA: Diagnosis not present

## 2024-06-09 DIAGNOSIS — E1122 Type 2 diabetes mellitus with diabetic chronic kidney disease: Secondary | ICD-10-CM | POA: Diagnosis not present

## 2024-06-09 DIAGNOSIS — N186 End stage renal disease: Secondary | ICD-10-CM | POA: Diagnosis not present

## 2024-06-09 DIAGNOSIS — E11628 Type 2 diabetes mellitus with other skin complications: Secondary | ICD-10-CM | POA: Diagnosis not present

## 2024-06-09 DIAGNOSIS — Z992 Dependence on renal dialysis: Secondary | ICD-10-CM | POA: Diagnosis not present

## 2024-06-10 DIAGNOSIS — G8929 Other chronic pain: Secondary | ICD-10-CM | POA: Diagnosis not present

## 2024-06-10 DIAGNOSIS — M1990 Unspecified osteoarthritis, unspecified site: Secondary | ICD-10-CM | POA: Diagnosis not present

## 2024-06-10 DIAGNOSIS — G2581 Restless legs syndrome: Secondary | ICD-10-CM | POA: Diagnosis not present

## 2024-06-10 DIAGNOSIS — Z89512 Acquired absence of left leg below knee: Secondary | ICD-10-CM | POA: Diagnosis not present

## 2024-06-11 DIAGNOSIS — R195 Other fecal abnormalities: Secondary | ICD-10-CM | POA: Diagnosis not present

## 2024-06-11 DIAGNOSIS — D631 Anemia in chronic kidney disease: Secondary | ICD-10-CM | POA: Diagnosis not present

## 2024-06-17 DIAGNOSIS — E113512 Type 2 diabetes mellitus with proliferative diabetic retinopathy with macular edema, left eye: Secondary | ICD-10-CM | POA: Diagnosis not present

## 2024-06-17 DIAGNOSIS — I10 Essential (primary) hypertension: Secondary | ICD-10-CM | POA: Diagnosis not present

## 2024-06-17 DIAGNOSIS — Z79899 Other long term (current) drug therapy: Secondary | ICD-10-CM | POA: Diagnosis not present

## 2024-06-17 DIAGNOSIS — I509 Heart failure, unspecified: Secondary | ICD-10-CM | POA: Diagnosis not present

## 2024-06-18 DIAGNOSIS — D631 Anemia in chronic kidney disease: Secondary | ICD-10-CM | POA: Diagnosis not present

## 2024-06-18 DIAGNOSIS — Z992 Dependence on renal dialysis: Secondary | ICD-10-CM | POA: Diagnosis not present

## 2024-06-18 DIAGNOSIS — E875 Hyperkalemia: Secondary | ICD-10-CM | POA: Diagnosis not present

## 2024-06-18 DIAGNOSIS — N186 End stage renal disease: Secondary | ICD-10-CM | POA: Diagnosis not present

## 2024-06-19 ENCOUNTER — Ambulatory Visit: Attending: Cardiology | Admitting: Cardiology

## 2024-06-19 ENCOUNTER — Encounter: Payer: Self-pay | Admitting: Cardiology

## 2024-06-19 VITALS — BP 131/58 | HR 82

## 2024-06-19 DIAGNOSIS — G8928 Other chronic postprocedural pain: Secondary | ICD-10-CM | POA: Diagnosis not present

## 2024-06-19 DIAGNOSIS — I480 Paroxysmal atrial fibrillation: Secondary | ICD-10-CM | POA: Diagnosis not present

## 2024-06-19 DIAGNOSIS — I10 Essential (primary) hypertension: Secondary | ICD-10-CM

## 2024-06-19 DIAGNOSIS — I25118 Atherosclerotic heart disease of native coronary artery with other forms of angina pectoris: Secondary | ICD-10-CM | POA: Diagnosis not present

## 2024-06-19 MED ORDER — OXYCODONE-ACETAMINOPHEN 7.5-325 MG PO TABS: 1.0000 | ORAL_TABLET | Freq: Three times a day (TID) | ORAL | Status: DC | PRN

## 2024-06-19 NOTE — Patient Instructions (Signed)
 Medication Instructions:  The current medical regimen is effective;  continue present plan and medications.  *If you need a refill on your cardiac medications before your next appointment, please call your pharmacy*  Follow-Up: At [facility], you and your health needs are our priority.  As part of our continuing mission to provide you with exceptional heart care, our providers are all part of one team.  This team includes your primary Cardiologist (physician) and Advanced Practice Providers or APPs (Physician Assistants and Nurse Practitioners) who all work together to provide you with the care you need, when you need it.  Your next appointment:   6 month(s)  Provider:   Knox Perl, MD    We recommend signing up for the patient portal called "MyChart".  Sign up information is provided on this After Visit Summary.  MyChart is used to connect with patients for Virtual Visits (Telemedicine).  Patients are able to view lab/test results, encounter notes, upcoming appointments, etc.  Non-urgent messages can be sent to your provider as well.   To learn more about what you can do with MyChart, go to [email or web address].

## 2024-06-19 NOTE — Progress Notes (Signed)
 Cardiology Office Note:  .   Date:  06/19/2024  ID:  [name], DOB [birth year], MRN [id number] PCP: Montey Lot, PA-C  [facility] Providers Cardiologist:  Gordy Bergamo, MD   History of Present Illness: .   [name] is a 68 y.o. Caucasian male patient with coronary artery disease with history of PCI and stenting to RCA in 2016, presenting with NSTEMI on 11/24/2023 underwent cardiac catheterization and had in-stent restenosis to RCA and severe stenosis in LCx, LCx was treated with DES, ISR versus partially successful with inability to expand the stents completely due to severe calcification in the RCA.   He also presented with marked bradycardia and hypotension and pericardial effusion with tamponade physiology, amiodarone  was discontinued in June 2025 and carvedilol  also discontinued due to tachybradycardia syndrome, patient felt not to be a good candidate for pacemaker implantation.  He has done well with no recurrence of pericardial effusion and has not had any bradycardic episodes.  He is presently tolerating dialysis well without recurrence of anginal symptoms.  Cardiac Studies relevent.    Coronary artery disease  S/p extensive PCI to RCA in 2016 w 3 overlapping DES  NSTEMI in 11/2023 s/p 3 x 16 mm DES to pLCx, partially successful PCI (Shockwave lithotripsy, high pressure White balloon) to mRCA reduced to 50% stenosis  LHC 11/24/2023: D1 ostial 50; ostial LCx 99 thrombotic; ostial RCA 90 ISR, mid to distal RCA 80 ISR  Cardiac Catheterization 11/24/2023: 11/24/2023  In-stent restenosis to RCA,  partially successful lithotripsy and severe stenosis in LCx, treated with DES.       (HFpEF) heart failure with preserved ejection fraction  TTE 11/24/2023: EF 60-65, no RWMA, mild LVH, GRII DD, normal RVSF, mild LAE, mild aortic stenosis (mean 12, V-max 237 cm/s, DI 0.34) Paroxysmal atrial fibrillation Tachy Brady syndrome Amiodarone  Rx >> DC'd during admit 02/2024 w shock, brady,  pauses; req'd temp pacer; poor candidate for PPM Pericardial effusion s/p pericardiocentesis 02/2024 TTE 02/12/2024: Large pericardial effusion with tamponade, EF 60-65 Limited TTE 02/14/2024: EF 55, severe LVH, no pericardial effusion Limited TTE 02/17/2024: No pericardial effusion, EF 50-55    Discussed the use of AI scribe software for clinical note transcription with the patient, who gave verbal consent to proceed.  History of Present Illness [name] is a 68 year old male with coronary artery disease and atrial fibrillation who presents for cardiovascular follow-up.  He experiences fluctuating blood pressure during stable dialysis sessions but reports no recent chest pain or issues during these sessions. He is not using nitroglycerin . He is on Plavix  and Eliquis  for coronary artery disease and atrial fibrillation. Six months ago, he had a small myocardial infarction, which required additional stents in the circumflex artery and an attempt to open the right coronary artery. His hemoglobin remains stable without bleeding issues. He is planning a trip to [place] in two weeks for three weeks.  Labs   Lab Results  Component Value Date   CHOL 104 03/31/2023   HDL 23 (L) 03/31/2023   LDLCALC 54 03/31/2023   TRIG 135 03/31/2023   CHOLHDL 4.5 03/31/2023   Lipoprotein (a)  Date/Time Value Ref Range Status  03/31/2023 03:14 AM 226.9 (H) <75.0 nmol/L Final    Comment:    (NOTE) Note:  Values greater than or equal to 75.0 nmol/L may       indicate an independent risk factor for CHD,       but must be evaluated with caution when  applied       to non-Caucasian populations due to the       influence of genetic factors on Lp(a) across       ethnicities. Performed At: [facility] [address] [place], [place] [id number] Jennette Shorter MD Ey:[id number]     Recent Labs    03/26/24 0805 03/27/24 0201 03/28/24 0716  NA 134* 136 136  K 3.6 3.3* 3.9  CL 97* 98 95*  CO2 23 26 25    GLUCOSE 188* 155* 153*  BUN 39* 23 30*  CREATININE 6.48* 4.52* 5.86*  CALCIUM  7.7* 7.7* 8.0*  GFRNONAA 9* 13* 10*    Lab Results  Component Value Date   ALT 14 03/27/2024   AST 21 03/27/2024   ALKPHOS 82 03/27/2024   BILITOT 0.4 03/27/2024      Latest Ref Rng & Units 03/27/2024    2:01 AM 03/26/2024    8:05 AM 03/24/2024    6:07 PM  CBC  WBC 4.0 - 10.5 K/uL 4.5  5.3  9.3   Hemoglobin 13.0 - 17.0 g/dL 9.6  89.7  9.5   Hematocrit 39.0 - 52.0 % 30.7  31.9  30.1   Platelets 150 - 400 K/uL 171  141  147    Lab Results  Component Value Date   HGBA1C 5.4 02/01/2024    Lab Results  Component Value Date   TSH 0.771 05/28/2023    ROS  Review of Systems  Cardiovascular:  Negative for chest pain, dyspnea on exertion and leg swelling.   Physical Exam:   VS:  BP (!) 131/58   Pulse 82   SpO2 94%    Wt Readings from Last 3 Encounters:  03/28/24 238 lb (108 kg)  03/18/24 250 lb (113.4 kg)  03/09/24 250 lb (113.4 kg)    BP Readings from Last 3 Encounters:  06/19/24 (!) 131/58  03/28/24 (!) 166/75  03/19/24 131/85   Physical Exam Constitutional:      Appearance: He is obese.  Neck:     Vascular: No carotid bruit or JVD.  Cardiovascular:     Rate and Rhythm: Normal rate and regular rhythm.     Pulses:           Right popliteal pulse not accessible and left popliteal pulse not accessible.     Heart sounds: Murmur heard.     Harsh midsystolic murmur is present with a grade of 2/6 at the upper right sternal border.     No gallop.  Pulmonary:     Effort: Pulmonary effort is normal.     Breath sounds: Normal breath sounds.  Abdominal:     General: Bowel sounds are normal.     Palpations: Abdomen is soft.  Musculoskeletal:        General: Deformity (Bilateral BKA) present.    EKG:         ASSESSMENT AND PLAN: .      ICD-10-CM   1. Coronary artery disease of native artery of native heart with stable angina pectoris  I25.118     2. Paroxysmal atrial fibrillation  with RVR (HCC)  I48.0     3. Primary hypertension  I10     4. Other chronic postprocedural pain  G89.28 oxyCODONE -acetaminophen  (PERCOCET ) 7.5-325 MG tablet      Assessment and Plan Assessment & Plan Chronic pain requiring opioid therapy Chronic pain management is suboptimal due to a reduction in oxycodone  dosage from 7.5 mg to 5 mg during a hospital admission,  resulting in increased pain in the legs, neck, and back, affecting his quality of life. - Increase oxycodone  to 7.5 mg/325 mg three times a day as needed for pain management.  Atherosclerotic heart disease with prior PCI and recent (June 2025)stent placement Atherosclerotic heart disease with prior PCI and recent stent placement in the circumflex artery and right coronary artery following a myocardial infarction six months ago. Currently on dual antiplatelet therapy with Plavix  and Eliquis . Risk of bleeding is a concern due to end-stage renal disease and low hemoglobin, but hemoglobin is well-managed and there are no current bleeding issues. - Continue Plavix  for another six months. - Reassess the need for Plavix  at the next visit in six months.  Paroxysmal atrial fibrillation Paroxysmal atrial fibrillation managed with Eliquis  for anticoagulation. The use of Eliquis  is appropriate given his atrial fibrillation and the need to prevent thromboembolic events. The risk of bleeding is acknowledged, and the risks of bleeding may outweigh the benefits of anticoagulation for this patient. - Continue Eliquis  for anticoagulation.  End-stage renal disease on hemodialysis On hemodialysis for end-stage renal disease. Blood pressure fluctuations are noted during dialysis sessions, but there are no significant issues reported. Dialysis is otherwise proceeding well without any recent complications. - Continue current hemodialysis regimen.   Follow up: 6 months   Signed,  Gordy Bergamo, MD, [facility] 06/19/2024, 12:28 PM [facility] [address] [place], [place] [address] Phone: [phone]. Fax:  [phone]

## 2024-06-22 DIAGNOSIS — I5032 Chronic diastolic (congestive) heart failure: Secondary | ICD-10-CM | POA: Diagnosis not present

## 2024-06-22 DIAGNOSIS — Z992 Dependence on renal dialysis: Secondary | ICD-10-CM | POA: Diagnosis not present

## 2024-06-22 DIAGNOSIS — N186 End stage renal disease: Secondary | ICD-10-CM | POA: Diagnosis not present

## 2024-06-22 DIAGNOSIS — J449 Chronic obstructive pulmonary disease, unspecified: Secondary | ICD-10-CM | POA: Diagnosis not present

## 2024-06-24 DIAGNOSIS — E113512 Type 2 diabetes mellitus with proliferative diabetic retinopathy with macular edema, left eye: Secondary | ICD-10-CM | POA: Diagnosis not present

## 2024-06-24 DIAGNOSIS — H02833 Dermatochalasis of right eye, unspecified eyelid: Secondary | ICD-10-CM | POA: Diagnosis not present

## 2024-06-24 DIAGNOSIS — Z794 Long term (current) use of insulin: Secondary | ICD-10-CM | POA: Diagnosis not present

## 2024-06-24 DIAGNOSIS — H02836 Dermatochalasis of left eye, unspecified eyelid: Secondary | ICD-10-CM | POA: Diagnosis not present

## 2024-06-24 DIAGNOSIS — H43813 Vitreous degeneration, bilateral: Secondary | ICD-10-CM | POA: Diagnosis not present

## 2024-06-24 DIAGNOSIS — E113511 Type 2 diabetes mellitus with proliferative diabetic retinopathy with macular edema, right eye: Secondary | ICD-10-CM | POA: Diagnosis not present

## 2024-07-08 DIAGNOSIS — I10 Essential (primary) hypertension: Secondary | ICD-10-CM | POA: Diagnosis not present

## 2024-07-08 DIAGNOSIS — I4891 Unspecified atrial fibrillation: Secondary | ICD-10-CM | POA: Diagnosis not present

## 2024-07-08 DIAGNOSIS — R319 Hematuria, unspecified: Secondary | ICD-10-CM | POA: Diagnosis not present

## 2024-07-08 DIAGNOSIS — Z89512 Acquired absence of left leg below knee: Secondary | ICD-10-CM | POA: Diagnosis not present

## 2024-07-08 DIAGNOSIS — R195 Other fecal abnormalities: Secondary | ICD-10-CM | POA: Diagnosis not present

## 2024-07-10 DIAGNOSIS — D631 Anemia in chronic kidney disease: Secondary | ICD-10-CM | POA: Diagnosis not present

## 2024-07-10 DIAGNOSIS — E1122 Type 2 diabetes mellitus with diabetic chronic kidney disease: Secondary | ICD-10-CM | POA: Diagnosis not present

## 2024-07-10 DIAGNOSIS — N186 End stage renal disease: Secondary | ICD-10-CM | POA: Diagnosis not present

## 2024-07-10 DIAGNOSIS — I132 Hypertensive heart and chronic kidney disease with heart failure and with stage 5 chronic kidney disease, or end stage renal disease: Secondary | ICD-10-CM | POA: Diagnosis not present

## 2024-07-10 DIAGNOSIS — K625 Hemorrhage of anus and rectum: Secondary | ICD-10-CM | POA: Diagnosis not present

## 2024-07-10 DIAGNOSIS — K5900 Constipation, unspecified: Secondary | ICD-10-CM | POA: Diagnosis not present

## 2024-07-10 DIAGNOSIS — Z992 Dependence on renal dialysis: Secondary | ICD-10-CM | POA: Diagnosis not present

## 2024-07-10 DIAGNOSIS — R531 Weakness: Secondary | ICD-10-CM | POA: Diagnosis not present

## 2024-07-10 DIAGNOSIS — N390 Urinary tract infection, site not specified: Secondary | ICD-10-CM | POA: Diagnosis not present

## 2024-07-13 ENCOUNTER — Encounter: Payer: Self-pay | Admitting: Radiology

## 2024-07-13 DIAGNOSIS — Z993 Dependence on wheelchair: Secondary | ICD-10-CM | POA: Diagnosis not present

## 2024-07-13 DIAGNOSIS — M6281 Muscle weakness (generalized): Secondary | ICD-10-CM | POA: Diagnosis not present

## 2024-07-13 DIAGNOSIS — E11628 Type 2 diabetes mellitus with other skin complications: Secondary | ICD-10-CM | POA: Diagnosis not present

## 2024-07-13 DIAGNOSIS — Z89512 Acquired absence of left leg below knee: Secondary | ICD-10-CM | POA: Diagnosis not present

## 2024-07-14 DIAGNOSIS — Z993 Dependence on wheelchair: Secondary | ICD-10-CM | POA: Diagnosis not present

## 2024-07-14 DIAGNOSIS — E11628 Type 2 diabetes mellitus with other skin complications: Secondary | ICD-10-CM | POA: Diagnosis not present

## 2024-07-14 DIAGNOSIS — M6281 Muscle weakness (generalized): Secondary | ICD-10-CM | POA: Diagnosis not present

## 2024-07-14 DIAGNOSIS — Z89512 Acquired absence of left leg below knee: Secondary | ICD-10-CM | POA: Diagnosis not present

## 2024-07-15 DIAGNOSIS — Z89512 Acquired absence of left leg below knee: Secondary | ICD-10-CM | POA: Diagnosis not present

## 2024-07-15 DIAGNOSIS — M6281 Muscle weakness (generalized): Secondary | ICD-10-CM | POA: Diagnosis not present

## 2024-07-15 DIAGNOSIS — Z993 Dependence on wheelchair: Secondary | ICD-10-CM | POA: Diagnosis not present

## 2024-07-15 DIAGNOSIS — E11628 Type 2 diabetes mellitus with other skin complications: Secondary | ICD-10-CM | POA: Diagnosis not present

## 2024-07-17 DIAGNOSIS — M6281 Muscle weakness (generalized): Secondary | ICD-10-CM | POA: Diagnosis not present

## 2024-07-17 DIAGNOSIS — E11628 Type 2 diabetes mellitus with other skin complications: Secondary | ICD-10-CM | POA: Diagnosis not present

## 2024-07-17 DIAGNOSIS — Z89512 Acquired absence of left leg below knee: Secondary | ICD-10-CM | POA: Diagnosis not present

## 2024-07-17 DIAGNOSIS — Z993 Dependence on wheelchair: Secondary | ICD-10-CM | POA: Diagnosis not present

## 2024-07-20 DIAGNOSIS — Z993 Dependence on wheelchair: Secondary | ICD-10-CM | POA: Diagnosis not present

## 2024-07-20 DIAGNOSIS — Z89512 Acquired absence of left leg below knee: Secondary | ICD-10-CM | POA: Diagnosis not present

## 2024-07-20 DIAGNOSIS — E11628 Type 2 diabetes mellitus with other skin complications: Secondary | ICD-10-CM | POA: Diagnosis not present

## 2024-07-20 DIAGNOSIS — M6281 Muscle weakness (generalized): Secondary | ICD-10-CM | POA: Diagnosis not present

## 2024-07-21 DIAGNOSIS — Z89512 Acquired absence of left leg below knee: Secondary | ICD-10-CM | POA: Diagnosis not present

## 2024-07-21 DIAGNOSIS — M6281 Muscle weakness (generalized): Secondary | ICD-10-CM | POA: Diagnosis not present

## 2024-07-21 DIAGNOSIS — Z993 Dependence on wheelchair: Secondary | ICD-10-CM | POA: Diagnosis not present

## 2024-07-21 DIAGNOSIS — E11628 Type 2 diabetes mellitus with other skin complications: Secondary | ICD-10-CM | POA: Diagnosis not present

## 2024-07-22 DIAGNOSIS — Z89512 Acquired absence of left leg below knee: Secondary | ICD-10-CM | POA: Diagnosis not present

## 2024-07-22 DIAGNOSIS — M6281 Muscle weakness (generalized): Secondary | ICD-10-CM | POA: Diagnosis not present

## 2024-07-22 DIAGNOSIS — Z993 Dependence on wheelchair: Secondary | ICD-10-CM | POA: Diagnosis not present

## 2024-07-22 DIAGNOSIS — E11628 Type 2 diabetes mellitus with other skin complications: Secondary | ICD-10-CM | POA: Diagnosis not present

## 2024-07-23 DIAGNOSIS — E11628 Type 2 diabetes mellitus with other skin complications: Secondary | ICD-10-CM | POA: Diagnosis not present

## 2024-07-23 DIAGNOSIS — Z89512 Acquired absence of left leg below knee: Secondary | ICD-10-CM | POA: Diagnosis not present

## 2024-07-23 DIAGNOSIS — M6281 Muscle weakness (generalized): Secondary | ICD-10-CM | POA: Diagnosis not present

## 2024-07-23 DIAGNOSIS — Z993 Dependence on wheelchair: Secondary | ICD-10-CM | POA: Diagnosis not present

## 2024-07-24 DIAGNOSIS — E1142 Type 2 diabetes mellitus with diabetic polyneuropathy: Secondary | ICD-10-CM | POA: Diagnosis not present

## 2024-07-24 DIAGNOSIS — Z993 Dependence on wheelchair: Secondary | ICD-10-CM | POA: Diagnosis not present

## 2024-07-24 DIAGNOSIS — M159 Polyosteoarthritis, unspecified: Secondary | ICD-10-CM | POA: Diagnosis not present

## 2024-07-24 DIAGNOSIS — Z89512 Acquired absence of left leg below knee: Secondary | ICD-10-CM | POA: Diagnosis not present

## 2024-07-24 DIAGNOSIS — M6281 Muscle weakness (generalized): Secondary | ICD-10-CM | POA: Diagnosis not present

## 2024-07-24 DIAGNOSIS — E11628 Type 2 diabetes mellitus with other skin complications: Secondary | ICD-10-CM | POA: Diagnosis not present

## 2024-07-27 DIAGNOSIS — Z993 Dependence on wheelchair: Secondary | ICD-10-CM | POA: Diagnosis not present

## 2024-07-27 DIAGNOSIS — E11628 Type 2 diabetes mellitus with other skin complications: Secondary | ICD-10-CM | POA: Diagnosis not present

## 2024-07-27 DIAGNOSIS — M6281 Muscle weakness (generalized): Secondary | ICD-10-CM | POA: Diagnosis not present

## 2024-07-27 DIAGNOSIS — Z89512 Acquired absence of left leg below knee: Secondary | ICD-10-CM | POA: Diagnosis not present

## 2024-07-28 DIAGNOSIS — I5032 Chronic diastolic (congestive) heart failure: Secondary | ICD-10-CM | POA: Diagnosis not present

## 2024-07-28 DIAGNOSIS — M6281 Muscle weakness (generalized): Secondary | ICD-10-CM | POA: Diagnosis not present

## 2024-07-28 DIAGNOSIS — I132 Hypertensive heart and chronic kidney disease with heart failure and with stage 5 chronic kidney disease, or end stage renal disease: Secondary | ICD-10-CM | POA: Diagnosis not present

## 2024-07-28 DIAGNOSIS — M159 Polyosteoarthritis, unspecified: Secondary | ICD-10-CM | POA: Diagnosis not present

## 2024-07-28 DIAGNOSIS — N186 End stage renal disease: Secondary | ICD-10-CM | POA: Diagnosis not present

## 2024-07-28 DIAGNOSIS — K5903 Drug induced constipation: Secondary | ICD-10-CM | POA: Diagnosis not present

## 2024-07-28 DIAGNOSIS — Z992 Dependence on renal dialysis: Secondary | ICD-10-CM | POA: Diagnosis not present

## 2024-07-28 DIAGNOSIS — Z794 Long term (current) use of insulin: Secondary | ICD-10-CM | POA: Diagnosis not present

## 2024-07-28 DIAGNOSIS — T402X5D Adverse effect of other opioids, subsequent encounter: Secondary | ICD-10-CM | POA: Diagnosis not present

## 2024-07-28 DIAGNOSIS — Z89512 Acquired absence of left leg below knee: Secondary | ICD-10-CM | POA: Diagnosis not present

## 2024-07-28 DIAGNOSIS — I25118 Atherosclerotic heart disease of native coronary artery with other forms of angina pectoris: Secondary | ICD-10-CM | POA: Diagnosis not present

## 2024-07-28 DIAGNOSIS — D631 Anemia in chronic kidney disease: Secondary | ICD-10-CM | POA: Diagnosis not present

## 2024-07-28 DIAGNOSIS — I482 Chronic atrial fibrillation, unspecified: Secondary | ICD-10-CM | POA: Diagnosis not present

## 2024-07-28 DIAGNOSIS — E11628 Type 2 diabetes mellitus with other skin complications: Secondary | ICD-10-CM | POA: Diagnosis not present

## 2024-07-28 DIAGNOSIS — Z993 Dependence on wheelchair: Secondary | ICD-10-CM | POA: Diagnosis not present

## 2024-07-28 DIAGNOSIS — E1142 Type 2 diabetes mellitus with diabetic polyneuropathy: Secondary | ICD-10-CM | POA: Diagnosis not present

## 2024-07-29 DIAGNOSIS — E11628 Type 2 diabetes mellitus with other skin complications: Secondary | ICD-10-CM | POA: Diagnosis not present

## 2024-07-29 DIAGNOSIS — M6281 Muscle weakness (generalized): Secondary | ICD-10-CM | POA: Diagnosis not present

## 2024-07-29 DIAGNOSIS — Z89512 Acquired absence of left leg below knee: Secondary | ICD-10-CM | POA: Diagnosis not present

## 2024-07-29 DIAGNOSIS — Z993 Dependence on wheelchair: Secondary | ICD-10-CM | POA: Diagnosis not present

## 2024-08-03 DIAGNOSIS — I120 Hypertensive chronic kidney disease with stage 5 chronic kidney disease or end stage renal disease: Secondary | ICD-10-CM | POA: Diagnosis not present

## 2024-08-03 DIAGNOSIS — K625 Hemorrhage of anus and rectum: Secondary | ICD-10-CM | POA: Diagnosis not present

## 2024-08-03 DIAGNOSIS — N186 End stage renal disease: Secondary | ICD-10-CM | POA: Diagnosis not present

## 2024-08-03 DIAGNOSIS — Z992 Dependence on renal dialysis: Secondary | ICD-10-CM | POA: Diagnosis not present

## 2024-08-03 DIAGNOSIS — R531 Weakness: Secondary | ICD-10-CM | POA: Diagnosis not present

## 2024-08-03 DIAGNOSIS — Z515 Encounter for palliative care: Secondary | ICD-10-CM | POA: Diagnosis not present

## 2024-08-04 DIAGNOSIS — Z794 Long term (current) use of insulin: Secondary | ICD-10-CM | POA: Diagnosis not present

## 2024-08-04 DIAGNOSIS — E1142 Type 2 diabetes mellitus with diabetic polyneuropathy: Secondary | ICD-10-CM | POA: Diagnosis not present

## 2024-08-09 DIAGNOSIS — N186 End stage renal disease: Secondary | ICD-10-CM | POA: Diagnosis not present

## 2024-08-09 DIAGNOSIS — E1122 Type 2 diabetes mellitus with diabetic chronic kidney disease: Secondary | ICD-10-CM | POA: Diagnosis not present

## 2024-08-09 DIAGNOSIS — Z992 Dependence on renal dialysis: Secondary | ICD-10-CM | POA: Diagnosis not present

## 2024-08-11 ENCOUNTER — Emergency Department (HOSPITAL_COMMUNITY)

## 2024-08-11 ENCOUNTER — Other Ambulatory Visit: Payer: Self-pay

## 2024-08-11 ENCOUNTER — Encounter (HOSPITAL_COMMUNITY): Payer: Self-pay

## 2024-08-11 ENCOUNTER — Observation Stay (HOSPITAL_COMMUNITY)
Admission: EM | Admit: 2024-08-11 | Discharge: 2024-08-12 | Disposition: A | Discharge status: Skilled Nursing Facility | Attending: Internal Medicine | Admitting: Internal Medicine

## 2024-08-11 DIAGNOSIS — G8928 Other chronic postprocedural pain: Secondary | ICD-10-CM

## 2024-08-11 DIAGNOSIS — I120 Hypertensive chronic kidney disease with stage 5 chronic kidney disease or end stage renal disease: Secondary | ICD-10-CM | POA: Insufficient documentation

## 2024-08-11 DIAGNOSIS — Z794 Long term (current) use of insulin: Secondary | ICD-10-CM | POA: Insufficient documentation

## 2024-08-11 DIAGNOSIS — J189 Pneumonia, unspecified organism: Principal | ICD-10-CM | POA: Insufficient documentation

## 2024-08-11 DIAGNOSIS — Z91040 Latex allergy status: Secondary | ICD-10-CM | POA: Insufficient documentation

## 2024-08-11 DIAGNOSIS — E8779 Other fluid overload: Secondary | ICD-10-CM | POA: Diagnosis not present

## 2024-08-11 DIAGNOSIS — Z79899 Other long term (current) drug therapy: Secondary | ICD-10-CM | POA: Insufficient documentation

## 2024-08-11 DIAGNOSIS — R0602 Shortness of breath: Secondary | ICD-10-CM | POA: Diagnosis not present

## 2024-08-11 DIAGNOSIS — G4733 Obstructive sleep apnea (adult) (pediatric): Secondary | ICD-10-CM | POA: Diagnosis present

## 2024-08-11 DIAGNOSIS — I2510 Atherosclerotic heart disease of native coronary artery without angina pectoris: Secondary | ICD-10-CM | POA: Diagnosis not present

## 2024-08-11 DIAGNOSIS — R0989 Other specified symptoms and signs involving the circulatory and respiratory systems: Secondary | ICD-10-CM | POA: Diagnosis not present

## 2024-08-11 DIAGNOSIS — E8770 Fluid overload, unspecified: Principal | ICD-10-CM | POA: Diagnosis present

## 2024-08-11 DIAGNOSIS — I480 Paroxysmal atrial fibrillation: Secondary | ICD-10-CM | POA: Diagnosis not present

## 2024-08-11 DIAGNOSIS — R059 Cough, unspecified: Secondary | ICD-10-CM | POA: Diagnosis not present

## 2024-08-11 DIAGNOSIS — N186 End stage renal disease: Secondary | ICD-10-CM

## 2024-08-11 DIAGNOSIS — Z992 Dependence on renal dialysis: Secondary | ICD-10-CM | POA: Diagnosis not present

## 2024-08-11 DIAGNOSIS — R0902 Hypoxemia: Secondary | ICD-10-CM

## 2024-08-11 DIAGNOSIS — J168 Pneumonia due to other specified infectious organisms: Secondary | ICD-10-CM | POA: Diagnosis not present

## 2024-08-11 DIAGNOSIS — F17210 Nicotine dependence, cigarettes, uncomplicated: Secondary | ICD-10-CM | POA: Insufficient documentation

## 2024-08-11 DIAGNOSIS — I517 Cardiomegaly: Secondary | ICD-10-CM | POA: Diagnosis not present

## 2024-08-11 DIAGNOSIS — Z7901 Long term (current) use of anticoagulants: Secondary | ICD-10-CM | POA: Diagnosis not present

## 2024-08-11 DIAGNOSIS — R001 Bradycardia, unspecified: Secondary | ICD-10-CM | POA: Diagnosis not present

## 2024-08-11 DIAGNOSIS — R000 Tachycardia, unspecified: Secondary | ICD-10-CM | POA: Diagnosis not present

## 2024-08-11 DIAGNOSIS — G2581 Restless legs syndrome: Secondary | ICD-10-CM | POA: Diagnosis present

## 2024-08-11 DIAGNOSIS — J449 Chronic obstructive pulmonary disease, unspecified: Secondary | ICD-10-CM | POA: Insufficient documentation

## 2024-08-11 DIAGNOSIS — I453 Trifascicular block: Secondary | ICD-10-CM | POA: Diagnosis present

## 2024-08-11 DIAGNOSIS — J810 Acute pulmonary edema: Secondary | ICD-10-CM | POA: Diagnosis not present

## 2024-08-11 DIAGNOSIS — E1122 Type 2 diabetes mellitus with diabetic chronic kidney disease: Secondary | ICD-10-CM | POA: Insufficient documentation

## 2024-08-11 DIAGNOSIS — R918 Other nonspecific abnormal finding of lung field: Secondary | ICD-10-CM | POA: Diagnosis not present

## 2024-08-11 DIAGNOSIS — R531 Weakness: Secondary | ICD-10-CM | POA: Diagnosis not present

## 2024-08-11 LAB — CBC WITH DIFFERENTIAL/PLATELET
Abs Immature Granulocytes: 0.02 K/uL (ref 0.00–0.07)
Basophils Absolute: 0.1 K/uL (ref 0.0–0.1)
Basophils Relative: 1 %
Eosinophils Absolute: 0.2 K/uL (ref 0.0–0.5)
Eosinophils Relative: 3 %
HCT: 40.7 % (ref 39.0–52.0)
Hemoglobin: 12.3 g/dL — ABNORMAL LOW (ref 13.0–17.0)
Immature Granulocytes: 0 %
Lymphocytes Relative: 15 %
Lymphs Abs: 1 K/uL (ref 0.7–4.0)
MCH: 27.8 pg (ref 26.0–34.0)
MCHC: 30.2 g/dL (ref 30.0–36.0)
MCV: 91.9 fL (ref 80.0–100.0)
Monocytes Absolute: 1 K/uL (ref 0.1–1.0)
Monocytes Relative: 15 %
Neutro Abs: 4.7 K/uL (ref 1.7–7.7)
Neutrophils Relative %: 66 %
Platelets: 150 K/uL (ref 150–400)
RBC: 4.43 MIL/uL (ref 4.22–5.81)
RDW: 17.3 % — ABNORMAL HIGH (ref 11.5–15.5)
WBC: 7 K/uL (ref 4.0–10.5)
nRBC: 0 % (ref 0.0–0.2)

## 2024-08-11 LAB — RESP PANEL BY RT-PCR (RSV, FLU A&B, COVID)  RVPGX2
Influenza A by PCR: NEGATIVE
Influenza B by PCR: NEGATIVE
Resp Syncytial Virus by PCR: NEGATIVE
SARS Coronavirus 2 by RT PCR: NEGATIVE

## 2024-08-11 LAB — BASIC METABOLIC PANEL WITH GFR
Anion gap: 16 — ABNORMAL HIGH (ref 5–15)
BUN: 47 mg/dL — ABNORMAL HIGH (ref 8–23)
CO2: 26 mmol/L (ref 22–32)
Calcium: 8.7 mg/dL — ABNORMAL LOW (ref 8.9–10.3)
Chloride: 92 mmol/L — ABNORMAL LOW (ref 98–111)
Creatinine, Ser: 7.2 mg/dL — ABNORMAL HIGH (ref 0.61–1.24)
GFR, Estimated: 8 mL/min — ABNORMAL LOW (ref >60)
Glucose, Bld: 129 mg/dL — ABNORMAL HIGH (ref 70–99)
Potassium: 5.5 mmol/L — ABNORMAL HIGH (ref 3.5–5.1)
Sodium: 134 mmol/L — ABNORMAL LOW (ref 135–145)

## 2024-08-11 LAB — I-STAT CG4 LACTIC ACID, ED: Lactic Acid, Venous: 2.3 mmol/L (ref 0.5–1.9)

## 2024-08-11 MED ORDER — ONDANSETRON HCL 4 MG/2ML IJ SOLN: 4.0000 mg | Freq: Four times a day (QID) | INTRAMUSCULAR | Status: DC | PRN

## 2024-08-11 MED ORDER — DOXYCYCLINE HYCLATE 100 MG PO TABS
100.0000 mg | ORAL_TABLET | Freq: Once | ORAL | Status: AC
  Administered 2024-08-11: 100 mg via ORAL
  Filled 2024-08-11: qty 1

## 2024-08-11 MED ORDER — IPRATROPIUM-ALBUTEROL 0.5-2.5 (3) MG/3ML IN SOLN
3.0000 mL | Freq: Once | RESPIRATORY_TRACT | Status: AC
  Administered 2024-08-11: 3 mL via RESPIRATORY_TRACT

## 2024-08-11 MED ORDER — IPRATROPIUM-ALBUTEROL 0.5-2.5 (3) MG/3ML IN SOLN
3.0000 mL | Freq: Once | RESPIRATORY_TRACT | Status: AC
  Administered 2024-08-11: 3 mL via RESPIRATORY_TRACT
  Filled 2024-08-11: qty 3

## 2024-08-11 MED ORDER — SODIUM CHLORIDE 0.9% FLUSH
3.0000 mL | Freq: Two times a day (BID) | INTRAVENOUS | Status: DC
  Administered 2024-08-12: 3 mL via INTRAVENOUS

## 2024-08-11 MED ORDER — INSULIN ASPART 100 UNIT/ML IJ SOLN: 0.0000 [IU] | Freq: Three times a day (TID) | INTRAMUSCULAR | Status: DC

## 2024-08-11 MED ORDER — ONDANSETRON HCL 4 MG PO TABS: 4.0000 mg | ORAL_TABLET | Freq: Four times a day (QID) | ORAL | Status: DC | PRN

## 2024-08-11 MED ORDER — SODIUM CHLORIDE 0.9 % IV SOLN
2.0000 g | Freq: Once | INTRAVENOUS | Status: AC
  Administered 2024-08-11: 2 g via INTRAVENOUS
  Filled 2024-08-11: qty 20

## 2024-08-11 MED ORDER — PREDNISONE 20 MG PO TABS
60.0000 mg | ORAL_TABLET | Freq: Once | ORAL | Status: AC
  Administered 2024-08-11: 60 mg via ORAL
  Filled 2024-08-11: qty 3

## 2024-08-11 NOTE — H&P (Incomplete)
 History and Physical    Patient: [name] FMW:[id number] DOB: 1955/10/14 DOA: 08/11/2024 DOS: the patient was seen and examined on 08/11/2024 PCP: Montey Lot, PA-C  Patient coming from: Dialysis  Chief Complaint:  Chief Complaint  Patient presents with   Shortness of Breath   HPI: [name] is a 68 y.o. male with medical history significant end-stage renal disease on hemodialysis dialysis Tuesday, Thursday, Saturday, type 2 diabetes mellitus, htn, COPD, peripheral artery disease status post bilateral below-knee amputations, atrial fibrillation on Eliquis , history of coronary artery disease and drug-eluting stent March 2025 on Plavix , and history of C. difficile infection who is sent in from hemodialysis today because of mild hypoxia and persistent volume overload despite aggressive dialysis today.  The patient had 4.9 L taken off in dialysis today.  At the end of his treatment he was still clinically volume overloaded and had a few pulse oximeter checks that were in the 80s.  When I asked the patient why he was sent to the emergency department he says he was tricked by the hemodialysis nurses, but he is agreeable to stay to have extra dialysis. He denies increased shortness of breath or chest pain or any symptoms out of the ordinary.  The patient says he missed dialysis only once in his last 3 appointments.  It was reported to me that the patient missed his dialysis quite a bit. Nephrology was consulted by the ED doctor.  Patient will be admitted by the hospitalist service.  Review of Systems: As mentioned in the history of present illness. All other systems reviewed and are negative. Past Medical History:  Diagnosis Date   Carotid artery occlusion 11/10/2010   LEFT CAROTID ENDARTERECTOMY   Complication of anesthesia    BP WENT UP AT [facility]    COPD (chronic obstructive pulmonary disease) (HCC)    pt denies this dx as of 06/01/20 - no inhaler    Diabetes mellitus without  complication (HCC)    Diverticulitis    Diverticulosis of colon (without mention of hemorrhage)    DJD (degenerative joint disease)    knees/hands/feet/back/neck   ESRD (end stage renal disease) on dialysis (HCC)    Fatty liver    Full dentures    GERD (gastroesophageal reflux disease)    H/O hiatal hernia    History of blood transfusion    with a past surical procedure per patient 06/01/20   History of right below knee amputation (HCC) 07/12/2016   Hyperlipidemia    Hypertension    Neuromuscular disorder (HCC)    peripheral neuropathy   Non-pressure chronic ulcer of other part of left foot limited to breakdown of skin (HCC) 11/12/2016   Nonrheumatic aortic (valve) stenosis 03/18/2024   Limited TTE 03/18/2024: EF 55-60, moderate LVH, normal RVSF, moderate LAE, mild MR, mild-moderate AS (mean 12, V-max 235 cm/s, DI 0.35), aortic root 39 mm, moderate pleural effusion, trivial pericardial effusion    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 Atherectomy left popliteal artery July 2012.    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   Unstable angina (HCC) 09/16/2018   Wears glasses    Past  Surgical History:  Procedure Laterality Date   ABDOMINAL AORTOGRAM W/LOWER EXTREMITY N/A 06/23/2021   Procedure: ABDOMINAL AORTOGRAM W/LOWER EXTREMITY;  Surgeon: Elmira Newman PARAS, MD;  [facility]: [facility];  Service: Cardiovascular;  Laterality: N/A;   AMPUTATION  11/05/2011   Procedure: AMPUTATION RAY;  Surgeon: Norleen Armor, MD;  Location: [facility] OR;  Service: Orthopedics;  Laterality: Right;  Amputation of Right 4&5th Toes   AMPUTATION Left 11/26/2012   Procedure: AMPUTATION RAY;  Surgeon: Norleen Armor,  MD;  Location: [facility] OR;  Service: Orthopedics;  Laterality: Left;  fourth ray amputation   AMPUTATION Right 08/27/2014   Procedure: Transmetatarsal Amputation;  Surgeon: Jerona Harden GAILS, MD;  Location: [facility] OR;  Service: Orthopedics;  Laterality: Right;   AMPUTATION Right 01/14/2015   Procedure: AMPUTATION BELOW KNEE;  Surgeon: Jerona Harden GAILS, MD;  Location: [facility] OR;  Service: Orthopedics;  Laterality: Right;   AMPUTATION Left 10/21/2015   Procedure: Left Foot 5th Ray Amputation;  Surgeon: Jerona Harden GAILS, MD;  Location: [facility] OR;  Service: Orthopedics;  Laterality: Left;   AMPUTATION Left 07/04/2022   Procedure: LEFT BELOW KNEE AMPUTATION;  Surgeon: Harden Jerona GAILS, MD;  Location: [facility] OR;  Service: Orthopedics;  Laterality: Left;   ANTERIOR FUSION CERVICAL SPINE  02/06/06   C4-5, C5-6, C6-7; SURGEON DR. MAX COHEN   AV FISTULA PLACEMENT Left 06/02/2020   Procedure: ARTERIOVENOUS (AV) FISTULA CREATION LEFT;  Surgeon: Sheree Penne Bruckner, MD;  Location: [facility] OR;  Service: Vascular;  Laterality: Left;   BACK SURGERY     x 3   BASCILIC VEIN TRANSPOSITION Left 07/21/2020   Procedure: LEFT UPPER ARM ATERIOVENOUS SUPERFISTULALIZATION;  Surgeon: Sheree Penne Bruckner, MD;  Location: [facility] OR;  Service: Vascular;  Laterality: Left;   BELOW KNEE LEG AMPUTATION Right    CARDIAC CATHETERIZATION  10/31/04   2009   CAROTID ENDARTERECTOMY  11/10/10   CAROTID ENDARTERECTOMY Left 11/10/2010   Subtotal occlusion of left internal carotid artery with left hemispheric transient ischemic attacks.   CAROTID STENT     CARPAL TUNNEL RELEASE Right 10/21/2013   Procedure: RIGHT CARPAL TUNNEL RELEASE;  Surgeon: Arley JONELLE Curia, MD;  Location: [facility];  Service: Orthopedics;  Laterality: Right;   CHOLECYSTECTOMY     COLON SURGERY     COLONOSCOPY     COLOSTOMY REVERSAL  05/21/2018   ileostomy reversal   CORONARY LITHOTRIPSY N/A 11/24/2023   Procedure: CORONARY LITHOTRIPSY;  Surgeon: Jordan, Peter M, MD;  Location: [facility]  INVASIVE CV LAB;  Service: Cardiovascular;  Laterality: N/A;   CORONARY STENT INTERVENTION N/A 11/24/2023   Procedure: CORONARY STENT INTERVENTION;  Surgeon: Jordan, Peter M, MD;  Location: [facility] INVASIVE CV LAB;  Service: Cardiovascular;  Laterality: N/A;   CYSTOSCOPY WITH STENT PLACEMENT Bilateral 01/13/2018   Procedure: CYSTOSCOPY WITH BILATERAL URETERAL CATHETER PLACEMENT;  Surgeon: Cam Morene ORN, MD;  Location: [facility] ORS;  Service: Urology;  Laterality: Bilateral;   ESOPHAGEAL MANOMETRY Bilateral 07/19/2014   Procedure: ESOPHAGEAL MANOMETRY (EM);  Surgeon: Gordy CHRISTELLA Starch, MD;  Location: [facility];  Service: Gastroenterology;  Laterality: Bilateral;   EYE SURGERY Bilateral 2020   cataract   FEMORAL ARTERY STENT     x6   FINGER SURGERY     FOOT SURGERY  04/25/2016    EXCISION BASE 5TH METATARSAL AND PARTIAL CUBOID LEFT FOOT   HERNIA REPAIR     LEFT INGUINAL AND UMBILICAL REPAIRS   HERNIA REPAIR     I & D EXTREMITY Left 04/25/2016  Procedure: EXCISION BASE 5TH METATARSAL AND PARTIAL CUBOID LEFT FOOT;  Surgeon: Jerona LULLA Sage, MD;  Location: [facility] OR;  Service: Orthopedics;  Laterality: Left;   ILEOSTOMY  01/13/2018   Procedure: ILEOSTOMY;  Surgeon: Signe Mitzie LABOR, MD;  Location: [facility];  Service: General;;   ILEOSTOMY CLOSURE N/A 05/21/2018   Procedure: ILEOSTOMY REVERSAL ERAS PATHWAY;  Surgeon: Signe Mitzie LABOR, MD;  Location: [facility] OR;  Service: General;  Laterality: N/A;   IR RADIOLOGIST EVAL & MGMT  11/19/2017   IR RADIOLOGIST EVAL & MGMT  12/03/2017   IR RADIOLOGIST EVAL & MGMT  12/18/2017   JOINT REPLACEMENT Right 2001   Total knee   LAMINECTOMY     X 3 LUMBAR AND X 2 CERVICAL SPINE OPERATIONS   LAPAROSCOPIC CHOLECYSTECTOMY W/ CHOLANGIOGRAPHY  11/09/04   SURGEON DR. DEWARD CANDIE NULL   LEFT HEART CATH AND CORONARY ANGIOGRAPHY N/A 09/16/2018   Procedure: LEFT HEART CATH AND CORONARY ANGIOGRAPHY;  Surgeon: Elmira Newman PARAS, MD;  [facility];  Service: Cardiovascular;   Laterality: N/A;   LEFT HEART CATH AND CORONARY ANGIOGRAPHY N/A 11/24/2023   Procedure: LEFT HEART CATH AND CORONARY ANGIOGRAPHY;  Surgeon: Jordan, Peter M, MD;  Location: [facility] INVASIVE CV LAB;  Service: Cardiovascular;  Laterality: N/A;   LEFT HEART CATHETERIZATION WITH CORONARY ANGIOGRAM N/A 10/29/2014   Procedure: LEFT HEART CATHETERIZATION WITH CORONARY ANGIOGRAM;  Surgeon: Erick JONELLE Bergamo, MD;  Location: [facility] CATH LAB;  Service: Cardiovascular;  Laterality: N/A;   LIGATION OF COMPETING BRANCHES OF ARTERIOVENOUS FISTULA Left 07/21/2020   Procedure: LIGATION OF COMPETING BRANCHES OF LEFT UPPER ARM ARTERIOVENOUS FISTULA;  Surgeon: Sheree Penne Bruckner, MD;  Location: [facility] OR;  Service: Vascular;  Laterality: Left;   LOWER EXTREMITY ANGIOGRAM N/A 03/19/2012   Procedure: LOWER EXTREMITY ANGIOGRAM;  Surgeon: Bruckner JONETTA Cash, MD;  Location: [facility] CATH LAB;  Service: Cardiovascular;  Laterality: N/A;   LOWER EXTREMITY ANGIOGRAPHY N/A 06/20/2021   Procedure: LOWER EXTREMITY ANGIOGRAPHY;  Surgeon: Elmira Newman PARAS, MD;  [facility];  Service: Cardiovascular;  Laterality: N/A;   NECK SURGERY     PARTIAL COLECTOMY N/A 01/13/2018   Procedure: LAPAROSCOPIC ASSISTED   SIGMOID COLECTOMY ILEOSTOMY;  Surgeon: Signe Mitzie LABOR, MD;  Location: [facility];  Service: General;  Laterality: N/A;   PENILE PROSTHESIS IMPLANT  08/14/05   INFRAPUBIC INSERTION OF INFLATABLE PENILE PROSTHESIS; SURGEON DR. JANIT   PENILE PROSTHESIS IMPLANT     PERCUTANEOUS CORONARY STENT INTERVENTION (PCI-S) Right 10/29/2014   Procedure: PERCUTANEOUS CORONARY STENT INTERVENTION (PCI-S);  Surgeon: Erick JONELLE Bergamo, MD;  Location: [facility] CATH LAB;  Service: Cardiovascular;  Laterality: Right;   PERICARDIOCENTESIS N/A 02/13/2024   Procedure: PERICARDIOCENTESIS;  Surgeon: Elmira Newman PARAS, MD;  [facility];  Service: Cardiovascular;  Laterality: N/A;   PERIPHERAL VASCULAR INTERVENTION Left 06/23/2021    Procedure: PERIPHERAL VASCULAR INTERVENTION;  Surgeon: Elmira Newman PARAS, MD;  [facility];  Service: Cardiovascular;  Laterality: Left;   REMOVAL OF PENILE PROSTHESIS N/A 06/14/2021   Procedure: Removal of THREE piece inflatable penile prosthesis;  Surgeon: Carolee Sherwood JONETTA DOUGLAS, MD;  Location: [facility] OR;  Service: Urology;  Laterality: N/A;   SHOULDER ARTHROSCOPY     SPINE SURGERY     TEMPORARY PACEMAKER N/A 02/13/2024   Procedure: TEMPORARY PACEMAKER;  Surgeon: Elmira Newman PARAS, MD;  [facility];  Service: Cardiovascular;  Laterality: N/A;   TOE AMPUTATION Left    TONSILLECTOMY  TOTAL KNEE ARTHROPLASTY  07/2002   RIGHT KNEE ; SURGEON  DR. [name] ALSO HAD ARTHROSCOPIC RIGHT KNEE IN  10/2001   TOTAL KNEE ARTHROPLASTY     ULNAR NERVE TRANSPOSITION Right 10/21/2013   Procedure: RIGHT ELBOW  ULNAR NERVE DECOMPRESSION;  Surgeon: Arley JONELLE Curia, MD;  Location: [facility];  Service: Orthopedics;  Laterality: Right;   Social History:  reports that he has been smoking cigarettes. He has a 35 pack-year smoking history. He has never used smokeless tobacco. He reports that he does not currently use alcohol . He reports that he does not use drugs.  Allergies  Allergen Reactions   Contrast Media [Iodinated Contrast Media] Shortness Of Breath and Other (See Comments)    Altered mental status     Ivp Dye [Iodinated Contrast Media] Anaphylaxis, Shortness Of Breath and Other (See Comments)    Altered mental status     Latex Rash    A severe rash appears after the first 24 hours of being placed   Tape Rash and Other (See Comments)    Rash after 1 day of use  Do not leave on longer then 3 days without changing    Family History  Problem Relation Age of Onset   Heart disease Father        Before age 37-  CAD, BPG   Diabetes Father        Amputation   Cancer Father        PROSTATE   Hyperlipidemia Father    Hypertension Father    Heart attack  Father        Triple BPG   Varicose Veins Father    Cancer Sister        Breast   Hyperlipidemia Sister    Hypertension Sister    Heart attack Brother    Colon cancer Brother    Diabetes Brother    Heart disease Brother 87       A-Fib. Before age 45   Hyperlipidemia Brother    Hypertension Brother    Hypertension Son    Arthritis Other        GRANDMOTHER   Hypertension Other        OTHER FAMILY MEMBERS    Prior to Admission medications   Medication Sig Start Date End Date Taking? Authorizing Provider  amLODipine  (NORVASC ) 10 MG tablet Take 5 mg by mouth daily.    [provider]  apixaban  (ELIQUIS ) 5 MG TABS tablet Take 1 tablet (5 mg total) by mouth 2 (two) times daily. 01/14/22 09/09/24  Gonfa, Taye T, MD  atorvastatin  (LIPITOR ) 80 MG tablet Take 1 tablet (80 mg total) by mouth at bedtime. 11/25/23   Amoako, Prince, MD  B Complex-C-Zn-Folic Acid  (DIALYVITE /ZINC ) TABS Take 1 tablet by mouth See admin instructions. Give 1 tablet by mouth 3 days a week prior to dialysis on Tuesday, Thursday, Saturday. 10/04/21   [provider]  clopidogrel  (PLAVIX ) 75 MG tablet Take 1 tablet (75 mg total) by mouth daily with breakfast. Patient taking differently: Take 75 mg by mouth daily. 11/25/23 11/24/24  Amoako, Prince, MD  colchicine  0.6 MG tablet Take 0.5 tablets (0.3 mg total) by mouth daily. 02/21/24   Gonfa, Taye T, MD  famotidine  (PEPCID ) 20 MG tablet Take 20 mg by mouth See admin instructions. Give 1 tablet (20mg ) by mouth every other morning.    [provider]  fentaNYL  (DURAGESIC ) 50 MCG/HR Place 1 patch onto the skin every 3 (three) days. 03/27/24  Laurence Locus, DO  folic acid  (FOLVITE ) 1 MG tablet Take 1 mg by mouth daily. 09/18/21   [provider]  gabapentin  (NEURONTIN ) 100 MG capsule Take 100 mg by mouth 3 (three) times daily.    [provider]  insulin  aspart (NOVOLOG  FLEXPEN) 100 UNIT/ML FlexPen Inject 4-16 Units into the skin See admin  instructions. Inject 4 units subcutaneously three times daily plus 0-12 units per sliding scale: 70 - 150 : 0 units 151-200 : 2 units 201-250 : 4 units 251-300 : 6 units 301-350 : 8 units 351-400 : 10 units 401-450 : 12 units > 400 : give 12 units and recheck in 1 hour - if still > 400, call MD/NP for further orders.    [provider]  isosorbide  mononitrate (IMDUR ) 60 MG 24 hr tablet Take 60 mg by mouth daily. Give 60mg  by mouth one time a day    [provider]  lidocaine -prilocaine  (EMLA ) cream Apply 1 application  topically See admin instructions. Apply topically 3 days a week prior to dialysis on Tuesday, Thursday, Saturday. 06/06/21   [provider]  loratadine  (CLARITIN ) 10 MG tablet Take 10 mg by mouth daily.    [provider]  mineral oil-hydrophilic petrolatum (AQUAPHOR) ointment Apply 1 Application topically every 8 (eight) hours as needed for irritation or dry skin.    [provider]  nitroGLYCERIN  (NITROSTAT ) 0.4 MG SL tablet Place 0.4 mg under the tongue every 5 (five) minutes as needed for chest pain. 01/06/22   [provider]  oxyCODONE -acetaminophen  (PERCOCET ) 7.5-325 MG tablet Take 1 tablet by mouth every 8 (eight) hours as needed for severe pain (pain score 7-10). 06/19/24   Ladona Heinz, MD  promethazine  (PHENERGAN ) 12.5 MG suppository Place 12.5 mg rectally daily as needed for nausea or vomiting.    [provider]  rOPINIRole  (REQUIP ) 2 MG tablet Take 2 mg by mouth at bedtime.    [provider]  sevelamer  carbonate (RENVELA ) 800 MG tablet Take 2 tablets (1,600 mg total) by mouth 3 (three) times daily with meals. 02/04/24   Amin, Sumayya, MD  tamsulosin  (FLOMAX ) 0.4 MG CAPS capsule Take 0.4 mg by mouth daily.    [provider]    Physical Exam: Vitals:   08/11/24 1739 08/11/24 1845 08/11/24 1846 08/11/24 2151  BP: 132/78 114/78  (!) 89/55  Pulse: 99   (!) 123  Resp: 18 20  18   Temp: 97.7  F (36.5 C)  97.9 F (36.6 C) 97.7 F (36.5 C)  TempSrc: Oral  Oral Oral  SpO2: 100%   98%  Weight:      Height:       Physical Exam:  General: Drowsy but arousable, disheveled HEENT: Normocephalic, atraumatic, PERRL Cardiovascular: Normal rate and rhythm. Distal pulses intact. Pulmonary: Normal pulmonary effort, some faint crackles in the bases and some very faint end expiratory wheezing Gastrointestinal: Distended abdomen, non-tender, normoactive bowel sounds, no organomegaly Musculoskeletal:Normal ROM, no lower ext edema Left arm access bandaged heavily. Skin: Skin is warm and dry. Neuro: No focal deficits noted, AAOx3. PSYCH: cooperative  Data Reviewed:  Results for orders placed or performed during the hospital encounter of 08/11/24 (from the past 24 hours)  Basic metabolic panel     Status: Abnormal   Collection Time: 08/11/24  8:17 PM  Result Value Ref Range   Sodium 134 (L) 135 - 145 mmol/L   Potassium 5.5 (H) 3.5 - 5.1 mmol/L   Chloride 92 (L) 98 - 111  mmol/L   CO2 26 22 - 32 mmol/L   Glucose, Bld 129 (H) 70 - 99 mg/dL   BUN 47 (H) 8 - 23 mg/dL   Creatinine, Ser 2.79 (H) 0.61 - 1.24 mg/dL   Calcium  8.7 (L) 8.9 - 10.3 mg/dL   GFR, Estimated 8 (L) >60 mL/min   Anion gap 16 (H) 5 - 15  CBC with Differential     Status: Abnormal   Collection Time: 08/11/24  8:17 PM  Result Value Ref Range   WBC 7.0 4.0 - 10.5 K/uL   RBC 4.43 4.22 - 5.81 MIL/uL   Hemoglobin 12.3 (L) 13.0 - 17.0 g/dL   HCT 59.2 60.9 - 47.9 %   MCV 91.9 80.0 - 100.0 fL   MCH 27.8 26.0 - 34.0 pg   MCHC 30.2 30.0 - 36.0 g/dL   RDW 82.6 (H) 88.4 - 84.4 %   Platelets 150 150 - 400 K/uL   nRBC 0.0 0.0 - 0.2 %   Neutrophils Relative % 66 %   Neutro Abs 4.7 1.7 - 7.7 K/uL   Lymphocytes Relative 15 %   Lymphs Abs 1.0 0.7 - 4.0 K/uL   Monocytes Relative 15 %   Monocytes Absolute 1.0 0.1 - 1.0 K/uL   Eosinophils Relative 3 %   Eosinophils Absolute 0.2 0.0 - 0.5 K/uL   Basophils Relative 1 %    Basophils Absolute 0.1 0.0 - 0.1 K/uL   Immature Granulocytes 0 %   Abs Immature Granulocytes 0.02 0.00 - 0.07 K/uL  Resp panel by RT-PCR (RSV, Flu A&B, Covid) Anterior Nasal Swab     Status: None   Collection Time: 08/11/24  9:12 PM   Specimen: Anterior Nasal Swab  Result Value Ref Range   SARS Coronavirus 2 by RT PCR NEGATIVE NEGATIVE   Influenza A by PCR NEGATIVE NEGATIVE   Influenza B by PCR NEGATIVE NEGATIVE   Resp Syncytial Virus by PCR NEGATIVE NEGATIVE  I-Stat CG4 Lactic Acid     Status: Abnormal   Collection Time: 08/11/24 11:17 PM  Result Value Ref Range   Lactic Acid, Venous 2.3 (HH) 0.5 - 1.9 mmol/L   Comment NOTIFIED PHYSICIAN    *Note: Due to a large number of results and/or encounters for the requested time period, some results have not been displayed. A complete set of results can be found in Results Review.     Assessment and Plan: Volume overload/ hyperkalemia despite aggressive dialysis today  - The patient will be admitted for extra hemodialysis - Nephrology consulted - Monitor potassium.  His potassium level in the ED was 5.5 initially.  This has just been repeated and we are waiting on results. - Pneumonia was treated empirically in the emergency department with IV antibiotics however I feel this is all volume overload so will not continue the antibiotics. -He was also treated with nebs and steroids in case this is COPD exacerbation.  The patient is slightly wheezy but most of that I suspect is from volume overload.  Will taper the steroids and continue the nebs  2. DM - monitor blood sugars - corrective dose insulin  for now  3. Hypotension - Monitoring.  BP improved spontaneously.  4.  Paroxysmal atrial fibrillation - Continue Eliquis  - Resume in Am.  The patient reported lots trouble stopping bleeding.  5.  History of PCI - Continue Plavix   6.  History of sacral decubitus ulcer 3 months ago  - Evaluate once the patient gets upstairs  7.  Chronic  pain  -on chronic narcotics which could be contributing to his excessive drowsiness.  8. INR = 2.6 - His old list says Eliquis  not Coumadin.  Await medication clarification.   Awaiting up-to-date home med list.   Advance Care Planning:   Code Status: Full Code the patient names his wife as a surrogate decision maker and he wants to be full code.  Consults: Nephrology  Family Communication: None Severity of Illness: The appropriate patient status for this patient is INPATIENT. Inpatient status is judged to be reasonable and necessary in order to provide the required intensity of service to ensure the patient's safety. The patient's presenting symptoms, physical exam findings, and initial radiographic and laboratory data in the context of their chronic comorbidities is felt to place them at high risk for further clinical deterioration. Furthermore, it is not anticipated that the patient will be medically stable for discharge from the hospital within 2 midnights of admission.   * I certify that at the point of admission it is my clinical judgment that the patient will require inpatient hospital care spanning beyond 2 midnights from the point of admission due to high intensity of service, high risk for further deterioration and high frequency of surveillance required.*  Author: ARTHEA CHILD, MD 08/11/2024 11:22 PM  For on call review [email or web address].

## 2024-08-11 NOTE — ED Triage Notes (Signed)
 Pt to ED via [facility] from Dialysis, c/o SHOB X 1 week, missed multiple dialysis apts prior to today. Full dialysis treatment today with 4.9 L off. Abdominal distention noted.   Last VS: 142/96, HR 104 irregular hx a fib, 90%RA, CBG 122.   No medications given by EMS.

## 2024-08-11 NOTE — ED Provider Notes (Signed)
 [place] EMERGENCY DEPARTMENT AT [facility] Provider Note   CSN: [id number] Arrival date & time: 08/11/24  1732     Patient presents with: Shortness of Breath   [name] is a 68 y.o. male.   68 year old male with past medical history of atrial fibrillation on Eliquis  as well as coronary artery disease end-stage renal disease presenting to the emergency department today with concern for hypoxia at dialysis.  The patient was apparently found to be hypoxic after dialysis today.  He was sent to the emergency department for further evaluation regarding this.  He reports that he did miss some dialysis sessions last week.  He himself denies any shortness of breath.  States that he has had a cough over the past few days.  He came to the emergency department for further evaluation regarding this due to these ongoing symptoms.  Denies any fevers, productive cough, or hemoptysis.   Shortness of Breath Associated symptoms: cough        Prior to Admission medications   Medication Sig Start Date End Date Taking? Authorizing Provider  amLODipine  (NORVASC ) 10 MG tablet Take 5 mg by mouth daily.    [provider]  apixaban  (ELIQUIS ) 5 MG TABS tablet Take 1 tablet (5 mg total) by mouth 2 (two) times daily. 01/14/22 09/09/24  Gonfa, Taye T, MD  atorvastatin  (LIPITOR ) 80 MG tablet Take 1 tablet (80 mg total) by mouth at bedtime. 11/25/23   Amoako, Prince, MD  B Complex-C-Zn-Folic Acid  (DIALYVITE /ZINC ) TABS Take 1 tablet by mouth See admin instructions. Give 1 tablet by mouth 3 days a week prior to dialysis on Tuesday, Thursday, Saturday. 10/04/21   [provider]  clopidogrel  (PLAVIX ) 75 MG tablet Take 1 tablet (75 mg total) by mouth daily with breakfast. Patient taking differently: Take 75 mg by mouth daily. 11/25/23 11/24/24  Amoako, Prince, MD  colchicine  0.6 MG tablet Take 0.5 tablets (0.3 mg total) by mouth daily. 02/21/24   Gonfa, Taye T, MD  famotidine  (PEPCID ) 20 MG  tablet Take 20 mg by mouth See admin instructions. Give 1 tablet (20mg ) by mouth every other morning.    [provider]  fentaNYL  (DURAGESIC ) 50 MCG/HR Place 1 patch onto the skin every 3 (three) days. 03/27/24   Laurence Locus, DO  folic acid  (FOLVITE ) 1 MG tablet Take 1 mg by mouth daily. 09/18/21   [provider]  gabapentin  (NEURONTIN ) 100 MG capsule Take 100 mg by mouth 3 (three) times daily.    [provider]  insulin  aspart (NOVOLOG  FLEXPEN) 100 UNIT/ML FlexPen Inject 4-16 Units into the skin See admin instructions. Inject 4 units subcutaneously three times daily plus 0-12 units per sliding scale: 70 - 150 : 0 units 151-200 : 2 units 201-250 : 4 units 251-300 : 6 units 301-350 : 8 units 351-400 : 10 units 401-450 : 12 units > 400 : give 12 units and recheck in 1 hour - if still > 400, call MD/NP for further orders.    [provider]  isosorbide  mononitrate (IMDUR ) 60 MG 24 hr tablet Take 60 mg by mouth daily. Give 60mg  by mouth one time a day    [provider]  lidocaine -prilocaine  (EMLA ) cream Apply 1 application  topically See admin instructions. Apply topically 3 days a week prior to dialysis on Tuesday, Thursday, Saturday. 06/06/21   [provider]  loratadine  (CLARITIN ) 10 MG tablet Take 10 mg by mouth daily.    [provider]  mineral oil-hydrophilic petrolatum (AQUAPHOR) ointment Apply 1 Application topically every 8 (eight) hours as needed for irritation or dry skin.    [provider]  nitroGLYCERIN  (NITROSTAT ) 0.4 MG SL tablet Place 0.4 mg under the tongue every 5 (five) minutes as needed for chest pain. 01/06/22   [provider]  oxyCODONE -acetaminophen  (PERCOCET ) 7.5-325 MG tablet Take 1 tablet by mouth every 8 (eight) hours as needed for severe pain (pain score 7-10). 06/19/24   Ladona Heinz, MD  promethazine  (PHENERGAN ) 12.5 MG suppository Place 12.5 mg rectally daily as needed for nausea or  vomiting.    [provider]  rOPINIRole  (REQUIP ) 2 MG tablet Take 2 mg by mouth at bedtime.    [provider]  sevelamer  carbonate (RENVELA ) 800 MG tablet Take 2 tablets (1,600 mg total) by mouth 3 (three) times daily with meals. 02/04/24   Amin, Sumayya, MD  tamsulosin  (FLOMAX ) 0.4 MG CAPS capsule Take 0.4 mg by mouth daily.    [provider]    Allergies: Contrast media [iodinated contrast media], Ivp dye [iodinated contrast media], Latex, and Tape    Review of Systems  Respiratory:  Positive for cough and shortness of breath.   All other systems reviewed and are negative.   Updated Vital Signs BP (!) 89/55 (BP Location: Right Arm)   Pulse (!) 123   Temp 97.7 F (36.5 C) (Oral)   Resp 18   Ht 6' 2 (1.88 m)   Wt 115.2 kg   SpO2 98%   BMI 32.61 kg/m   Physical Exam Vitals and nursing note reviewed.   Gen: NAD Eyes: PERRL, EOMI HEENT: no oropharyngeal swelling Neck: trachea midline Resp: Diffuse wheezes throughout all upper and lower lung fields Card: RRR, no murmurs, rubs, or gallops Abd: nontender, moderately distended Extremities: Bilateral lower extremities surgically absent Vascular: 2+ radial pulses bilaterally, 2+ DP pulses bilaterally Skin: no rashes Psyc: acting appropriately   (all labs ordered are listed, but only abnormal results are displayed) Labs Reviewed  BASIC METABOLIC PANEL WITH GFR - Abnormal; Notable for the following components:      Result Value   Sodium 134 (*)    Potassium 5.5 (*)    Chloride 92 (*)    Glucose, Bld 129 (*)    BUN 47 (*)    Creatinine, Ser 7.20 (*)    Calcium  8.7 (*)    GFR, Estimated 8 (*)    Anion gap 16 (*)    All other components within normal limits  CBC WITH DIFFERENTIAL/PLATELET - Abnormal; Notable for the following components:   Hemoglobin 12.3 (*)    RDW 17.3 (*)    All other components within normal limits  RESP PANEL BY RT-PCR (RSV, FLU A&B, COVID)  RVPGX2  CULTURE, BLOOD  (ROUTINE X 2)  CULTURE, BLOOD (ROUTINE X 2)  I-STAT CG4 LACTIC ACID, ED    EKG: EKG Interpretation Date/Time:  Tuesday August 11 2024 19:59:51 EST Ventricular Rate:  91 PR Interval:    QRS Duration:  182 QT Interval:  408 QTC Calculation: 502 R Axis:   -76  Text Interpretation: Atrial fibrillation RBBB and LAFB Abnormal T, consider ischemia, lateral leads Confirmed by Ula Barter [phone]) on 08/11/2024 8:02:08 PM  Radiology: [facility] Chest Port 1 View Result Date: 08/11/2024 CLINICAL DATA:  Shortness of breath. EXAM: PORTABLE CHEST 1 VIEW COMPARISON:  03/18/2024 FINDINGS: Stable cardiac enlargement. Very low lung volumes bilaterally. Bibasilar opacities likely represent atelectasis. Difficult to exclude component of pneumonia at either lung  base. Difficult to exclude mild underlying pulmonary interstitial edema. No significant pleural fluid or evidence of pneumothorax. The visualized skeletal structures are unremarkable. IMPRESSION: Very low lung volumes with bibasilar opacities likely representing atelectasis. Difficult to exclude component of pneumonia at either lung base. Difficult to exclude mild underlying pulmonary interstitial edema. Electronically Signed   By: Marcey Moan M.D.   On: 08/11/2024 18:29     Procedures   Medications Ordered in the ED  ipratropium-albuterol  (DUONEB) 0.5-2.5 (3) MG/3ML nebulizer solution 3 mL (has no administration in time range)  cefTRIAXone  (ROCEPHIN ) 2 g in sodium chloride  0.9 % 100 mL IVPB (has no administration in time range)  doxycycline  (VIBRA -TABS) tablet 100 mg (has no administration in time range)  predniSONE (DELTASONE) tablet 60 mg (60 mg Oral Given 08/11/24 1901)  ipratropium-albuterol  (DUONEB) 0.5-2.5 (3) MG/3ML nebulizer solution 3 mL (3 mLs Nebulization Given 08/11/24 1936)  ipratropium-albuterol  (DUONEB) 0.5-2.5 (3) MG/3ML nebulizer solution 3 mL (3 mLs Nebulization Given 08/11/24 1910)                                    Medical  Decision Making 68 year old male with past medical history of end-stage renal disease presenting to the emergency department today with concern for hypoxia.  The patient does have diffuse wheezes and is reporting a cough earlier.  He does appear somewhat volume overloaded here on exam as well but did have dialysis today and had 5 L drawn off.  I will further evaluate the patient here with basic labs as well as a chest x-ray to evaluate for pulmonary edema or pulmonary infiltrates.  I will give the patient prednisone here as well as DuoNebs because he does have significant wheezes here on exam which this could be due to reactive airway disease/bronchitis.  Also obtain viral panel to evaluate for viral etiologies.  I will reevaluate for ultimate disposition.  The patient is requiring oxygen  he will require admission.  If not I think that he could potentially be discharged.  The patient is on Eliquis  and with a wheezing on exam suspicion for pulmonary embolism is relatively low at this time.  The patient's chest x-ray shows pulmonary edema and questionable pneumonia.  His pulse ox is mostly in the high 90s but he will desat to the high 80s and low 90s briefly here but will usually improve without oxygen  ministration.  After observing the patient here for a while after the albuterol  he still remains mildly tachycardic here.  I will cover the patient with antibiotics here.  Given the symptoms I think he would benefit from further observation and possibly another dialysis session tomorrow.  He did have a couple lower blood pressures here but on recheck he has blood pressures in the low 100s systolic.  Given the picture of questionable volume overload we will hold off on any IV fluids as patient is on dialysis.  He has been on midodrine  in the past but it is not one of his chronic medications.  A call was placed hospitalist service for admission.  Amount and/or Complexity of Data Reviewed Labs: ordered. Radiology:  ordered.  Risk Prescription drug management. Decision regarding hospitalization.        Final diagnoses:  Pneumonia due to infectious organism, unspecified laterality, unspecified part of lung  Acute pulmonary edema [facility])    ED Discharge Orders     None  Ula Prentice SAUNDERS, MD 08/11/24 2229

## 2024-08-11 NOTE — ED Notes (Signed)
 Patient placed on 2 LO2 per MD Arthea.

## 2024-08-11 NOTE — ED Notes (Signed)
 CCMD called.

## 2024-08-12 DIAGNOSIS — E8770 Fluid overload, unspecified: Principal | ICD-10-CM

## 2024-08-12 DIAGNOSIS — I10 Essential (primary) hypertension: Secondary | ICD-10-CM | POA: Diagnosis not present

## 2024-08-12 DIAGNOSIS — Z992 Dependence on renal dialysis: Secondary | ICD-10-CM | POA: Diagnosis not present

## 2024-08-12 DIAGNOSIS — Z7401 Bed confinement status: Secondary | ICD-10-CM | POA: Diagnosis not present

## 2024-08-12 DIAGNOSIS — E669 Obesity, unspecified: Secondary | ICD-10-CM | POA: Diagnosis not present

## 2024-08-12 DIAGNOSIS — N186 End stage renal disease: Secondary | ICD-10-CM | POA: Diagnosis not present

## 2024-08-12 LAB — CBC
HCT: 37.6 % — ABNORMAL LOW (ref 39.0–52.0)
Hemoglobin: 11.6 g/dL — ABNORMAL LOW (ref 13.0–17.0)
MCH: 28.3 pg (ref 26.0–34.0)
MCHC: 30.9 g/dL (ref 30.0–36.0)
MCV: 91.7 fL (ref 80.0–100.0)
Platelets: 144 K/uL — ABNORMAL LOW (ref 150–400)
RBC: 4.1 MIL/uL — ABNORMAL LOW (ref 4.22–5.81)
RDW: 17.4 % — ABNORMAL HIGH (ref 11.5–15.5)
WBC: 5.9 K/uL (ref 4.0–10.5)
nRBC: 0 % (ref 0.0–0.2)

## 2024-08-12 LAB — BASIC METABOLIC PANEL WITH GFR
Anion gap: 20 — ABNORMAL HIGH (ref 5–15)
BUN: 51 mg/dL — ABNORMAL HIGH (ref 8–23)
CO2: 25 mmol/L (ref 22–32)
Calcium: 8.3 mg/dL — ABNORMAL LOW (ref 8.9–10.3)
Chloride: 91 mmol/L — ABNORMAL LOW (ref 98–111)
Creatinine, Ser: 7.85 mg/dL — ABNORMAL HIGH (ref 0.61–1.24)
GFR, Estimated: 7 mL/min — ABNORMAL LOW (ref >60)
Glucose, Bld: 217 mg/dL — ABNORMAL HIGH (ref 70–99)
Potassium: 6 mmol/L — ABNORMAL HIGH (ref 3.5–5.1)
Sodium: 136 mmol/L (ref 135–145)

## 2024-08-12 LAB — HEMOGLOBIN A1C
Hgb A1c MFr Bld: 6.4 % — ABNORMAL HIGH (ref 4.8–5.6)
Mean Plasma Glucose: 137 mg/dL

## 2024-08-12 LAB — HEPATITIS B SURFACE ANTIGEN: Hepatitis B Surface Ag: NONREACTIVE

## 2024-08-12 LAB — HIV ANTIBODY (ROUTINE TESTING W REFLEX): HIV Screen 4th Generation wRfx: NONREACTIVE

## 2024-08-12 LAB — GLUCOSE, CAPILLARY: Glucose-Capillary: 184 mg/dL — ABNORMAL HIGH (ref 70–99)

## 2024-08-12 LAB — I-STAT CG4 LACTIC ACID, ED: Lactic Acid, Venous: 1.6 mmol/L (ref 0.5–1.9)

## 2024-08-12 MED ORDER — HEPARIN SODIUM (PORCINE) 1000 UNIT/ML DIALYSIS
1000.0000 [IU] | INTRAMUSCULAR | Status: DC | PRN
  Filled 2024-08-12: qty 1

## 2024-08-12 MED ORDER — ALTEPLASE 2 MG IJ SOLR: 2.0000 mg | Freq: Once | INTRAMUSCULAR | Status: DC | PRN

## 2024-08-12 MED ORDER — OXYCODONE-ACETAMINOPHEN 7.5-325 MG PO TABS: 1.0000 | ORAL_TABLET | Freq: Three times a day (TID) | ORAL | 0 refills | Status: DC | PRN

## 2024-08-12 MED ORDER — LIDOCAINE HCL (PF) 1 % IJ SOLN: 5.0000 mL | INTRAMUSCULAR | Status: DC | PRN

## 2024-08-12 MED ORDER — CHLORHEXIDINE GLUCONATE CLOTH 2 % EX PADS: 6.0000 | MEDICATED_PAD | Freq: Every day | CUTANEOUS | Status: DC

## 2024-08-12 MED ORDER — FENTANYL 50 MCG/HR TD PT72: 1.0000 | MEDICATED_PATCH | TRANSDERMAL | 0 refills | Status: DC

## 2024-08-12 MED ORDER — SEVELAMER CARBONATE 800 MG PO TABS: 1600.0000 mg | ORAL_TABLET | Freq: Three times a day (TID) | ORAL | Status: DC

## 2024-08-12 MED ORDER — LIDOCAINE-PRILOCAINE 2.5-2.5 % EX CREA: 1.0000 | TOPICAL_CREAM | CUTANEOUS | Status: DC | PRN

## 2024-08-12 MED ORDER — APIXABAN 5 MG PO TABS: 5.0000 mg | ORAL_TABLET | Freq: Two times a day (BID) | ORAL | Status: DC

## 2024-08-12 MED ORDER — FAMOTIDINE 20 MG PO TABS: 20.0000 mg | ORAL_TABLET | ORAL | Status: DC

## 2024-08-12 MED ORDER — TAMSULOSIN HCL 0.4 MG PO CAPS: 0.4000 mg | ORAL_CAPSULE | Freq: Every day | ORAL | Status: DC

## 2024-08-12 MED ORDER — PENTAFLUOROPROP-TETRAFLUOROETH EX AERO: 1.0000 | INHALATION_SPRAY | CUTANEOUS | Status: DC | PRN

## 2024-08-12 MED ORDER — PENTAFLUOROPROP-TETRAFLUOROETH EX AERO
INHALATION_SPRAY | CUTANEOUS | Status: AC
  Filled 2024-08-12: qty 30

## 2024-08-12 NOTE — Progress Notes (Signed)
 D/C order noted. Contacted [facility] to be advised of pt's d/c today and that pt should resume care tomorrow.   Randine Mungo Dialysis Navigator [phone]

## 2024-08-12 NOTE — TOC Initial Note (Signed)
 Transition of Care [facility]) - Initial/Assessment Note    Patient Details  Name: [name] MRN: [id number] Date of Birth: 1956-04-30  [facility]) CM/SW Contact:    Luise JAYSON Pan, LCSWA Phone Number: 08/12/2024, 1:54 PM  Clinical Narrative:   Patient from [facility]. CSW confirmed with facility that patient can return today as he is medically ready.   Expected Discharge Plan: Skilled Nursing Facility Barriers to Discharge: Barriers Resolved   Patient Goals and CMS Choice Patient states their goals for this hospitalization and ongoing recovery are:: Back to ltc   Choice offered to / list presented to : NA      Expected Discharge Plan and Services In-house Referral: Clinical Social Work   Post Acute Care Choice: Nursing Home Living arrangements for the past 2 months: Skilled Nursing Facility Expected Discharge Date: 08/12/24                                    Prior Living Arrangements/Services Living arrangements for the past 2 months: Skilled Nursing Facility Lives with:: Facility Resident Patient language and need for interpreter reviewed:: Yes Do you feel safe going back to the place where you live?: Yes      Need for Family Participation in Patient Care: Yes (Comment) Care giver support system in place?: Yes (comment)   Criminal Activity/Legal Involvement Pertinent to Current Situation/Hospitalization: No - Comment as needed  Activities of Daily Living      Permission Sought/Granted Permission sought to share information with : Facility Medical Sales Representative, Family Supports Permission granted to share information with : Yes, Verbal Permission Granted  Share Information with NAME: Jager, Koska (Spouse)  [phone] (  Permission granted to share info w AGENCY: SNFs        Emotional Assessment Appearance:: Appears stated age Attitude/Demeanor/Rapport: Unable to Assess Affect (typically observed): Unable to  Assess Orientation: : Oriented to Self, Oriented to Place, Oriented to  Time, Oriented to Situation Alcohol  / Substance Use: Not Applicable Psych Involvement: No (comment)  Admission diagnosis:  Acute pulmonary edema (HCC) [J81.0] Volume overload [E87.70] Pneumonia due to infectious organism, unspecified laterality, unspecified part of lung [J18.9] Patient Active Problem List   Diagnosis Date Noted   Volume overload 08/11/2024   Non compliance with medical treatment 03/28/2024   Decubitus ulcer of sacral region, stage 2 (HCC) 03/25/2024   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/2024   Obesity, Class I, BMI 30-34.9 03/25/2024   Nonrheumatic aortic (valve) stenosis 03/18/2024   History of Clostridioides difficile colitis 02/12/2024   BPH (benign prostatic hyperplasia) 02/12/2024   ESRD (end stage renal disease) on dialysis (HCC) 07/19/2023   History of right coronary artery stent placement 03/31/2023   Current use of long term anticoagulation 03/31/2023   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) 03/31/2023   Paroxysmal atrial fibrillation (HCC) 03/31/2023   Anemia due to chronic kidney disease 01/13/2022   Obesity (BMI 30-39.9) 01/13/2022   Proliferative diabetic retinopathy of left eye with macular edema associated with type 2 diabetes mellitus (HCC) 11/27/2021   Paroxysmal atrial fibrillation with RVR (HCC) 06/14/2021   Carotid stenosis 06/15/2020   Dyslipidemia 06/15/2020   Diabetic macular edema of right eye with proliferative retinopathy associated with type 2 diabetes mellitus (HCC) 01/18/2020   Severe nonproliferative diabetic retinopathy of left eye, with macular edema, associated with type 2 diabetes mellitus (HCC) 01/04/2020   Retinal hemorrhage of  right eye 01/04/2020   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   S/P carotid endarterectomy 09/18/2018   OSA not tolerating CPAP 09/18/2018   Chronic pain 09/18/2018   Peripheral artery disease  11/12/2016   Unilateral primary osteoarthritis, left knee 10/11/2016   Type 2 diabetes mellitus with diabetic neuropathy (HCC) 05/09/2016   CAD (dz of distal, mid and proximal RCA with implantation of 3 overlapping drug-eluting stent, with elevated troponin 09/03/2015   (HFpEF) heart failure with preserved ejection fraction (HCC) 09/03/2015   Hyperlipidemia 08/25/2014   Limb pain 03/20/2013   Restless legs syndrome (RLS) 09/25/2012   COPD (chronic obstructive pulmonary disease) (HCC) 04/25/2012   Hypokalemia 11/01/2011   Occlusion and stenosis of carotid artery without mention of cerebral infarction 06/12/2011   GERD (gastroesophageal reflux disease) 05/08/2011   Barrett's esophagus without dysplasia 05/08/2011   Former tobacco use 02/01/2011   PCP:  Montey Lot, PA-C Pharmacy:   [id number] [place], [place] - [address] AT [address] [place] [address] Phone: [phone] Fax: [phone]  [id number] [place], [place] - [address] [place] [address] Phone: [phone] Fax: [phone]  [facility] - [place], [place] - [address] [place] [address] Phone: [phone] Fax: [phone]     Social Drivers of Health (SDOH) Social History: SDOH Screenings   Food Insecurity: No Food Insecurity (03/25/2024)  Housing: Low Risk  (03/25/2024)  Transportation Needs: No Transportation Needs (03/25/2024)  Utilities: Not At Risk (03/25/2024)  Social Connections: Moderately Isolated (03/25/2024)  Tobacco Use: High Risk (08/11/2024)   SDOH Interventions:     Readmission Risk Interventions    08/12/2024    1:17 PM 05/29/2023    4:41 PM  Readmission Risk Prevention Plan  Transportation Screening Complete Complete  Medication Review Oceanographer) Complete Complete  PCP or Specialist appointment within 3-5 days of discharge  Complete  HRI or Home Care Consult  Complete Complete  SW Recovery Care/Counseling Consult  Complete  Palliative Care Screening Not Applicable Not Applicable  Skilled Nursing Facility Not Applicable Complete

## 2024-08-12 NOTE — Care Management CC44 (Signed)
 Condition Code 44 Documentation Completed  Patient Details  Name: TAIKI BUCKWALTER MRN: [id number] Date of Birth: 19-Nov-1955   Condition Code 44 given:  Yes Patient signature on Condition Code 44 notice:  Yes Documentation of 2 MD's agreement:  Yes Code 44 added to claim:  Yes    Waddell Barnie Rama, RN 08/12/2024, 2:21 PM

## 2024-08-12 NOTE — Significant Event (Addendum)
 Hospitalist cross cover  The patient's repeat potassium is 6.0, up from 5.5.  It is currently 3:30 AM.  I just spoke to nephrology who is aware of the patient. He will likely be getting dialysis in just a couple of hours so they say  no need for acute treatment at this time unless ECG is changed.    ECG w LAFB and RBBB.  Twave inversions improved.

## 2024-08-12 NOTE — Consult Note (Addendum)
 [facility]  INPATIENT CONSULTATION  Reason for Consultation: ESRD volume overload Requesting Provider: Dr. Raenelle  HPI: [name] is an 68 y.o. male  with ESRD on HD TTS, HTN, T2DM, PAD (s/p B BKA), COPD, A fib, CAD s/p DES 11/2023 who was admitted with hypoxia and volume overload.   He was at dialysis yesterday and despite UF 4.9L was hypoxic and volume overloaded; due to sats in the 80s he was dispatched to [facility].  CXR poor insp r/o volume, has atelectasis.  He's been afebrile with labile BPs as low as 80/50s but this AM 130-150s.  He is on 2L [place] currently with Sats 100%.  Flu, COVID neg. Blood cultures pending.  Lactate 2.3 initially now normal.   EDW is 108kg, post wt's have recently been 117kg. Last achievement of EDW was 11/1. Generally running full tx but has missed several treatments recently, unclear why.   K this AM 6, WBC 5.9, Hb 11.6.   Seen at HD - tolerating well.  Very interested in d/c back to [facility] today if possible.  PMH: Past Medical History:  Diagnosis Date   Carotid artery occlusion 11/10/2010   LEFT CAROTID ENDARTERECTOMY   Complication of anesthesia    BP WENT UP AT [facility]    COPD (chronic obstructive pulmonary disease) (HCC)    pt denies this dx as of 06/01/20 - no inhaler    Diabetes mellitus without complication (HCC)    Diverticulitis    Diverticulosis of colon (without mention of hemorrhage)    DJD (degenerative joint disease)    knees/hands/feet/back/neck   ESRD (end stage renal disease) on dialysis (HCC)    Fatty liver    Full dentures    GERD (gastroesophageal reflux disease)    H/O hiatal hernia    History of blood transfusion    with a past surical procedure per patient 06/01/20   History of right below knee amputation (HCC) 07/12/2016   Hyperlipidemia    Hypertension    Neuromuscular disorder (HCC)    peripheral neuropathy   Non-pressure chronic ulcer of other part of left foot limited to breakdown of skin (HCC)  11/12/2016   Nonrheumatic aortic (valve) stenosis 03/18/2024   Limited TTE 03/18/2024: EF 55-60, moderate LVH, normal RVSF, moderate LAE, mild MR, mild-moderate AS (mean 12, V-max 235 cm/s, DI 0.35), aortic root 39 mm, moderate pleural effusion, trivial pericardial effusion    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 Atherectomy left popliteal artery July 2012.    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   Unstable angina (HCC) 09/16/2018   Wears glasses    PSH: Past Surgical History:  Procedure Laterality Date   ABDOMINAL AORTOGRAM W/LOWER EXTREMITY N/A 06/23/2021   Procedure: ABDOMINAL AORTOGRAM W/LOWER EXTREMITY;  Surgeon: Elmira Newman PARAS, MD;  [facility];  Service: Cardiovascular;  Laterality: N/A;   AMPUTATION  11/05/2011   Procedure: AMPUTATION RAY;  Surgeon: Norleen Armor, MD;  Location: [facility] OR;  Service: Orthopedics;  Laterality: Right;  Amputation of Right 4&5th Toes   AMPUTATION Left 11/26/2012   Procedure: AMPUTATION RAY;  Surgeon: Norleen Armor, MD;  Location: [facility] OR;  Service: Orthopedics;  Laterality: Left;  fourth ray amputation   AMPUTATION Right 08/27/2014   Procedure: Transmetatarsal Amputation;  Surgeon: Jerona Harden GAILS, MD;  Location: [facility] OR;  Service: Orthopedics;  Laterality: Right;   AMPUTATION Right 01/14/2015   Procedure: AMPUTATION BELOW KNEE;  Surgeon: Jerona Harden GAILS, MD;  Location: [facility] OR;  Service: Orthopedics;  Laterality: Right;   AMPUTATION Left 10/21/2015   Procedure: Left Foot 5th Ray Amputation;  Surgeon: Jerona Harden GAILS, MD;  Location: [facility] OR;  Service: Orthopedics;  Laterality: Left;   AMPUTATION Left 07/04/2022   Procedure: LEFT BELOW KNEE AMPUTATION;   Surgeon: Harden Jerona GAILS, MD;  Location: [facility] OR;  Service: Orthopedics;  Laterality: Left;   ANTERIOR FUSION CERVICAL SPINE  02/06/06   C4-5, C5-6, C6-7; SURGEON DR. MAX COHEN   AV FISTULA PLACEMENT Left 06/02/2020   Procedure: ARTERIOVENOUS (AV) FISTULA CREATION LEFT;  Surgeon: Sheree Penne Bruckner, MD;  Location: [facility] OR;  Service: Vascular;  Laterality: Left;   BACK SURGERY     x 3   BASCILIC VEIN TRANSPOSITION Left 07/21/2020   Procedure: LEFT UPPER ARM ATERIOVENOUS SUPERFISTULALIZATION;  Surgeon: Sheree Penne Bruckner, MD;  Location: [facility] OR;  Service: Vascular;  Laterality: Left;   BELOW KNEE LEG AMPUTATION Right    CARDIAC CATHETERIZATION  10/31/04   2009   CAROTID ENDARTERECTOMY  11/10/10   CAROTID ENDARTERECTOMY Left 11/10/2010   Subtotal occlusion of left internal carotid artery with left hemispheric transient ischemic attacks.   CAROTID STENT     CARPAL TUNNEL RELEASE Right 10/21/2013   Procedure: RIGHT CARPAL TUNNEL RELEASE;  Surgeon: Arley JONELLE Curia, MD;  Location: [facility];  Service: Orthopedics;  Laterality: Right;   CHOLECYSTECTOMY     COLON SURGERY     COLONOSCOPY     COLOSTOMY REVERSAL  05/21/2018   ileostomy reversal   CORONARY LITHOTRIPSY N/A 11/24/2023   Procedure: CORONARY LITHOTRIPSY;  Surgeon: Jordan, Peter M, MD;  Location: [facility] INVASIVE CV LAB;  Service: Cardiovascular;  Laterality: N/A;   CORONARY STENT INTERVENTION N/A 11/24/2023   Procedure: CORONARY STENT INTERVENTION;  Surgeon: Jordan, Peter M, MD;  Location: [facility] INVASIVE CV LAB;  Service: Cardiovascular;  Laterality: N/A;   CYSTOSCOPY WITH STENT PLACEMENT Bilateral 01/13/2018   Procedure: CYSTOSCOPY WITH BILATERAL URETERAL CATHETER PLACEMENT;  Surgeon: Cam Morene ORN, MD;  Location: [facility] ORS;  Service: Urology;  Laterality: Bilateral;   ESOPHAGEAL MANOMETRY Bilateral 07/19/2014   Procedure: ESOPHAGEAL MANOMETRY (EM);  Surgeon: Gordy CHRISTELLA Starch, MD;  Location: [facility];  Service: Gastroenterology;   Laterality: Bilateral;   EYE SURGERY Bilateral 2020   cataract   FEMORAL ARTERY STENT     x6   FINGER SURGERY     FOOT SURGERY  04/25/2016    EXCISION BASE 5TH METATARSAL AND PARTIAL CUBOID LEFT FOOT   HERNIA REPAIR     LEFT INGUINAL AND UMBILICAL REPAIRS   HERNIA REPAIR     I & D EXTREMITY Left 04/25/2016   Procedure: EXCISION BASE 5TH METATARSAL AND PARTIAL CUBOID LEFT FOOT;  Surgeon: Jerona GAILS Harden, MD;  Location: [facility];  Service: Orthopedics;  Laterality: Left;   ILEOSTOMY  01/13/2018   Procedure: ILEOSTOMY;  Surgeon: Signe Mitzie LABOR, MD;  Location: [facility]S;  Service: General;;   ILEOSTOMY CLOSURE N/A 05/21/2018   Procedure: ILEOSTOMY REVERSAL ERAS PATHWAY;  Surgeon: Signe Mitzie LABOR, MD;  Location: [facility] OR;  Service: General;  Laterality: N/A;   IR RADIOLOGIST EVAL & MGMT  11/19/2017   IR RADIOLOGIST EVAL & MGMT  12/03/2017   IR RADIOLOGIST EVAL & MGMT  12/18/2017   JOINT REPLACEMENT Right 2001   Total knee   LAMINECTOMY     X 3 LUMBAR AND X 2 CERVICAL SPINE OPERATIONS   LAPAROSCOPIC CHOLECYSTECTOMY W/ CHOLANGIOGRAPHY  11/09/04   SURGEON DR. DEWARD CANDIE NULL   LEFT HEART CATH AND CORONARY ANGIOGRAPHY N/A 09/16/2018   Procedure: LEFT HEART CATH AND CORONARY ANGIOGRAPHY;  Surgeon: Elmira Newman PARAS, MD;  [facility];  Service: Cardiovascular;  Laterality: N/A;   LEFT HEART CATH AND CORONARY ANGIOGRAPHY N/A 11/24/2023   Procedure: LEFT HEART CATH AND CORONARY ANGIOGRAPHY;  Surgeon: Jordan, Peter M, MD;  Location: [facility] INVASIVE CV LAB;  Service: Cardiovascular;  Laterality: N/A;   LEFT HEART CATHETERIZATION WITH CORONARY ANGIOGRAM N/A 10/29/2014   Procedure: LEFT HEART CATHETERIZATION WITH CORONARY ANGIOGRAM;  Surgeon: Erick JONELLE Bergamo, MD;  Location: [facility] CATH LAB;  Service: Cardiovascular;  Laterality: N/A;   LIGATION OF COMPETING BRANCHES OF ARTERIOVENOUS FISTULA Left 07/21/2020   Procedure: LIGATION OF COMPETING BRANCHES OF LEFT UPPER ARM ARTERIOVENOUS FISTULA;  Surgeon: Sheree Penne Bruckner, MD;  Location: [facility] OR;  Service: Vascular;  Laterality: Left;   LOWER EXTREMITY ANGIOGRAM N/A 03/19/2012   Procedure: LOWER EXTREMITY ANGIOGRAM;  Surgeon: Bruckner JONETTA Cash, MD;  Location: [facility] CATH LAB;  Service: Cardiovascular;  Laterality: N/A;   LOWER EXTREMITY ANGIOGRAPHY N/A 06/20/2021   Procedure: LOWER EXTREMITY ANGIOGRAPHY;  Surgeon: Elmira Newman PARAS, MD;  [facility];  Service: Cardiovascular;  Laterality: N/A;   NECK SURGERY     PARTIAL COLECTOMY N/A 01/13/2018   Procedure: LAPAROSCOPIC ASSISTED   SIGMOID COLECTOMY ILEOSTOMY;  Surgeon: Signe Mitzie LABOR, MD;  Location: [facility]S;  Service: General;  Laterality: N/A;   PENILE PROSTHESIS IMPLANT  08/14/05   INFRAPUBIC INSERTION OF INFLATABLE PENILE PROSTHESIS; SURGEON DR. JANIT   PENILE PROSTHESIS IMPLANT     PERCUTANEOUS CORONARY STENT INTERVENTION (PCI-S) Right 10/29/2014   Procedure: PERCUTANEOUS CORONARY STENT INTERVENTION (PCI-S);  Surgeon: Erick JONELLE Bergamo, MD;  Location: [facility] CATH LAB;  Service: Cardiovascular;  Laterality: Right;   PERICARDIOCENTESIS N/A 02/13/2024   Procedure: PERICARDIOCENTESIS;  Surgeon: Elmira Newman PARAS, MD;  [facility];  Service: Cardiovascular;  Laterality: N/A;   PERIPHERAL VASCULAR INTERVENTION Left 06/23/2021   Procedure: PERIPHERAL VASCULAR INTERVENTION;  Surgeon: Elmira Newman PARAS, MD;  [facility];  Service: Cardiovascular;  Laterality: Left;   REMOVAL OF PENILE PROSTHESIS N/A 06/14/2021   Procedure: Removal of THREE piece inflatable penile prosthesis;  Surgeon: Carolee Sherwood JONETTA DOUGLAS, MD;  Location: [facility] OR;  Service: Urology;  Laterality: N/A;   SHOULDER ARTHROSCOPY     SPINE SURGERY     TEMPORARY PACEMAKER N/A 02/13/2024   Procedure: TEMPORARY PACEMAKER;  Surgeon: Elmira Newman PARAS, MD;  [facility];  Service: Cardiovascular;  Laterality: N/A;   TOE AMPUTATION Left    TONSILLECTOMY     TOTAL KNEE ARTHROPLASTY   07/2002   RIGHT KNEE ; SURGEON  DR. [name] ALSO HAD ARTHROSCOPIC RIGHT KNEE IN  10/2001   TOTAL KNEE ARTHROPLASTY     ULNAR NERVE TRANSPOSITION Right 10/21/2013   Procedure: RIGHT ELBOW  ULNAR NERVE DECOMPRESSION;  Surgeon: Arley JONELLE Curia, MD;  Location: [facility];  Service: Orthopedics;  Laterality: Right;    Past Medical History:  Diagnosis Date   Carotid artery occlusion 11/10/2010   LEFT CAROTID ENDARTERECTOMY  Complication of anesthesia    BP WENT UP AT [facility]    COPD (chronic obstructive pulmonary disease) (HCC)    pt denies this dx as of 06/01/20 - no inhaler    Diabetes mellitus without complication (HCC)    Diverticulitis    Diverticulosis of colon (without mention of hemorrhage)    DJD (degenerative joint disease)    knees/hands/feet/back/neck   ESRD (end stage renal disease) on dialysis (HCC)    Fatty liver    Full dentures    GERD (gastroesophageal reflux disease)    H/O hiatal hernia    History of blood transfusion    with a past surical procedure per patient 06/01/20   History of right below knee amputation (HCC) 07/12/2016   Hyperlipidemia    Hypertension    Neuromuscular disorder (HCC)    peripheral neuropathy   Non-pressure chronic ulcer of other part of left foot limited to breakdown of skin (HCC) 11/12/2016   Nonrheumatic aortic (valve) stenosis 03/18/2024   Limited TTE 03/18/2024: EF 55-60, moderate LVH, normal RVSF, moderate LAE, mild MR, mild-moderate AS (mean 12, V-max 235 cm/s, DI 0.35), aortic root 39 mm, moderate pleural effusion, trivial pericardial effusion    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 Atherectomy left popliteal artery July 2012.    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  Medications Prior to Admission  Medication Sig Dispense Refill   amLODipine  (NORVASC ) 10 MG tablet Take 5 mg by mouth daily.     apixaban  (ELIQUIS ) 5 MG TABS tablet Take 1 tablet (5 mg total) by mouth 2 (two) times daily. 60 tablet 1   atorvastatin  (LIPITOR ) 80 MG tablet Take 1 tablet (80 mg total) by mouth at bedtime.     B Complex-C-Zn-Folic Acid  (DIALYVITE /ZINC ) TABS Take 1 tablet by mouth See admin instructions. Give 1 tablet by mouth 3 days a week prior to dialysis on Tuesday, Thursday, Saturday.     clopidogrel  (PLAVIX ) 75 MG tablet Take 1 tablet (75 mg total) by mouth daily with breakfast. (Patient taking differently: Take 75 mg by mouth daily.)     colchicine  0.6 MG tablet Take 0.5 tablets (0.3 mg total) by mouth daily.     famotidine  (PEPCID ) 20 MG tablet Take 20 mg by mouth See admin instructions. Give 1 tablet (20mg ) by mouth every other morning.     fentaNYL  (DURAGESIC ) 50 MCG/HR Place 1 patch onto the skin every 3 (three) days. 5 patch 0   folic acid  (FOLVITE ) 1 MG tablet Take 1 mg by mouth daily.     gabapentin  (NEURONTIN ) 100 MG capsule Take 100 mg by mouth 3 (three) times daily.     insulin  aspart (NOVOLOG  FLEXPEN) 100 UNIT/ML FlexPen Inject 4-16 Units into the skin See admin instructions. Inject 4 units subcutaneously three times daily plus 0-12 units per sliding scale: 70 - 150 : 0 units 151-200 : 2 units 201-250 : 4 units 251-300 : 6 units 301-350 : 8 units 351-400 : 10 units 401-450 : 12 units >  400 : give 12 units and recheck in 1 hour - if still > 400, call MD/NP for further orders.     isosorbide  mononitrate (IMDUR ) 60 MG 24 hr tablet Take 60 mg by mouth daily. Give 60mg  by mouth one time a day     lidocaine -prilocaine  (EMLA ) cream Apply 1 application  topically See admin instructions. Apply topically  3 days a week prior to dialysis on Tuesday, Thursday, Saturday.     loratadine  (CLARITIN ) 10 MG tablet Take 10 mg by mouth daily.     mineral oil-hydrophilic petrolatum (AQUAPHOR) ointment Apply 1 Application topically every 8 (eight) hours as needed for irritation or dry skin.     nitroGLYCERIN  (NITROSTAT ) 0.4 MG SL tablet Place 0.4 mg under the tongue every 5 (five) minutes as needed for chest pain.     oxyCODONE -acetaminophen  (PERCOCET ) 7.5-325 MG tablet Take 1 tablet by mouth every 8 (eight) hours as needed for severe pain (pain score 7-10).     promethazine  (PHENERGAN ) 12.5 MG suppository Place 12.5 mg rectally daily as needed for nausea or vomiting.     rOPINIRole  (REQUIP ) 2 MG tablet Take 2 mg by mouth at bedtime.     sevelamer  carbonate (RENVELA ) 800 MG tablet Take 2 tablets (1,600 mg total) by mouth 3 (three) times daily with meals.     tamsulosin  (FLOMAX ) 0.4 MG CAPS capsule Take 0.4 mg by mouth daily.      ALLERGIES:   Allergies  Allergen Reactions   Contrast Media [Iodinated Contrast Media] Shortness Of Breath and Other (See Comments)    Altered mental status     Ivp Dye [Iodinated Contrast Media] Anaphylaxis, Shortness Of Breath and Other (See Comments)    Altered mental status     Latex Rash    A severe rash appears after the first 24 hours of being placed   Tape Rash and Other (See Comments)    Rash after 1 day of use  Do not leave on longer then 3 days without changing    FAM HX: Family History  Problem Relation Age of Onset   Heart disease Father        Before age 82-  CAD, BPG   Diabetes Father        Amputation   Cancer Father        PROSTATE   Hyperlipidemia Father    Hypertension Father    Heart attack Father        Triple BPG   Varicose Veins Father    Cancer Sister        Breast   Hyperlipidemia Sister    Hypertension Sister    Heart attack Brother    Colon cancer Brother    Diabetes Brother    Heart disease Brother 19       A-Fib. Before age  56   Hyperlipidemia Brother    Hypertension Brother    Hypertension Son    Arthritis Other        GRANDMOTHER   Hypertension Other        OTHER FAMILY MEMBERS    Social History:   reports that he has been smoking cigarettes. He has a 35 pack-year smoking history. He has never used smokeless tobacco. He reports that he does not currently use alcohol . He reports that he does not use drugs.  ROS: 12 system ROS neg except per HPI above  Blood pressure (!) 156/73, pulse (!) 40, temperature 98 F (36.7 C), temperature source Axillary, resp. rate ROLLEN)  21, height 6' 2 (1.88 m), weight 115.2 kg, SpO2 94%. PHYSICAL EXAM: Gen: chronically ill but nontoxic appearing  Eyes: EOMI ENT: MMM Neck: supple, beard obstructs JVP assessment CV: RRR Abd:  soft, obese Lungs: normal WOB at rest, rales in bases, occ wheeze Extr:  BL BKA, 1+ edema, LUE AVF +t/b Neuro: AOx3 some myoclonic jerks which he says are chronic Skin: chronic ulcerated wounds on back of L hand which he says are stable, hand warm but 1+ radial pulse    Results for orders placed or performed during the hospital encounter of 08/11/24 (from the past 48 hours)  Basic metabolic panel     Status: Abnormal   Collection Time: 08/11/24  8:17 PM  Result Value Ref Range   Sodium 134 (L) 135 - 145 mmol/L   Potassium 5.5 (H) 3.5 - 5.1 mmol/L   Chloride 92 (L) 98 - 111 mmol/L   CO2 26 22 - 32 mmol/L   Glucose, Bld 129 (H) 70 - 99 mg/dL    Comment: Glucose reference range applies only to samples taken after fasting for at least 8 hours.   BUN 47 (H) 8 - 23 mg/dL   Creatinine, Ser 2.79 (H) 0.61 - 1.24 mg/dL   Calcium  8.7 (L) 8.9 - 10.3 mg/dL   GFR, Estimated 8 (L) >60 mL/min    Comment: (NOTE) Calculated using the CKD-EPI Creatinine Equation (2021)    Anion gap 16 (H) 5 - 15    Comment: Performed at [facility], [address] [place], [place] [address]  CBC with Differential     Status: Abnormal   Collection Time: 08/11/24   8:17 PM  Result Value Ref Range   WBC 7.0 4.0 - 10.5 K/uL   RBC 4.43 4.22 - 5.81 MIL/uL   Hemoglobin 12.3 (L) 13.0 - 17.0 g/dL   HCT 59.2 60.9 - 47.9 %   MCV 91.9 80.0 - 100.0 fL   MCH 27.8 26.0 - 34.0 pg   MCHC 30.2 30.0 - 36.0 g/dL   RDW 82.6 (H) 88.4 - 84.4 %   Platelets 150 150 - 400 K/uL   nRBC 0.0 0.0 - 0.2 %   Neutrophils Relative % 66 %   Neutro Abs 4.7 1.7 - 7.7 K/uL   Lymphocytes Relative 15 %   Lymphs Abs 1.0 0.7 - 4.0 K/uL   Monocytes Relative 15 %   Monocytes Absolute 1.0 0.1 - 1.0 K/uL   Eosinophils Relative 3 %   Eosinophils Absolute 0.2 0.0 - 0.5 K/uL   Basophils Relative 1 %   Basophils Absolute 0.1 0.0 - 0.1 K/uL   Immature Granulocytes 0 %   Abs Immature Granulocytes 0.02 0.00 - 0.07 K/uL    Comment: Performed at [facility], [address], [place], [place] [address]  Resp panel by RT-PCR (RSV, Flu A&B, Covid) Anterior Nasal Swab     Status: None   Collection Time: 08/11/24  9:12 PM   Specimen: Anterior Nasal Swab  Result Value Ref Range   SARS Coronavirus 2 by RT PCR NEGATIVE NEGATIVE   Influenza A by PCR NEGATIVE NEGATIVE   Influenza B by PCR NEGATIVE NEGATIVE    Comment: (NOTE) The Xpert Xpress SARS-CoV-2/FLU/RSV plus assay is intended as an aid in the diagnosis of influenza from Nasopharyngeal swab specimens and should not be used as a sole basis for treatment. Nasal washings and aspirates are unacceptable for Xpert Xpress SARS-CoV-2/FLU/RSV testing.  Fact Sheet for Patients: bloggercourse.com  Fact  Sheet for Healthcare Providers: seriousbroker.it  This test is not yet approved or cleared by the [place]  FDA and has been authorized for detection and/or diagnosis of SARS-CoV-2 by FDA under an Emergency Use Authorization (EUA). This EUA will remain in effect (meaning this test can be used) for the duration of the COVID-19 declaration under Section 564(b)(1) of the Act, 21  U.S.C. section 360bbb-3(b)(1), unless the authorization is terminated or revoked.     Resp Syncytial Virus by PCR NEGATIVE NEGATIVE    Comment: (NOTE) Fact Sheet for Patients: bloggercourse.com  Fact Sheet for Healthcare Providers: seriousbroker.it  This test is not yet approved or cleared by the [place]  FDA and has been authorized for detection and/or diagnosis of SARS-CoV-2 by FDA under an Emergency Use Authorization (EUA). This EUA will remain in effect (meaning this test can be used) for the duration of the COVID-19 declaration under Section 564(b)(1) of the Act, 21 U.S.C. section 360bbb-3(b)(1), unless the authorization is terminated or revoked.  Performed at [facility], [address], [place], [place] [address]   I-Stat CG4 Lactic Acid     Status: Abnormal   Collection Time: 08/11/24 11:17 PM  Result Value Ref Range   Lactic Acid, Venous 2.3 (HH) 0.5 - 1.9 mmol/L   Comment NOTIFIED PHYSICIAN   Basic metabolic panel     Status: Abnormal   Collection Time: 08/12/24  2:14 AM  Result Value Ref Range   Sodium 136 135 - 145 mmol/L   Potassium 6.0 (H) 3.5 - 5.1 mmol/L   Chloride 91 (L) 98 - 111 mmol/L   CO2 25 22 - 32 mmol/L   Glucose, Bld 217 (H) 70 - 99 mg/dL    Comment: Glucose reference range applies only to samples taken after fasting for at least 8 hours.   BUN 51 (H) 8 - 23 mg/dL   Creatinine, Ser 2.14 (H) 0.61 - 1.24 mg/dL   Calcium  8.3 (L) 8.9 - 10.3 mg/dL   GFR, Estimated 7 (L) >60 mL/min    Comment: (NOTE) Calculated using the CKD-EPI Creatinine Equation (2021)    Anion gap 20 (H) 5 - 15    Comment: Performed at [facility], [address], [place], [place] [address]  CBC     Status: Abnormal   Collection Time: 08/12/24  2:14 AM  Result Value Ref Range   WBC 5.9 4.0 - 10.5 K/uL   RBC 4.10 (L) 4.22 - 5.81 MIL/uL   Hemoglobin 11.6 (L) 13.0 - 17.0 g/dL   HCT 62.3 (L) 60.9 - 47.9 %   MCV  91.7 80.0 - 100.0 fL   MCH 28.3 26.0 - 34.0 pg   MCHC 30.9 30.0 - 36.0 g/dL   RDW 82.5 (H) 88.4 - 84.4 %   Platelets 144 (L) 150 - 400 K/uL   nRBC 0.0 0.0 - 0.2 %    Comment: Performed at [facility], [address] [place], [place] [address]  I-Stat CG4 Lactic Acid     Status: None   Collection Time: 08/12/24  3:53 AM  Result Value Ref Range   Lactic Acid, Venous 1.6 0.5 - 1.9 mmol/L   *Note: Due to a large number of results and/or encounters for the requested time period, some results have not been displayed. A complete set of results can be found in Results Review.    DG Chest Port 1 View Result Date: 08/11/2024 CLINICAL DATA:  Shortness of breath. EXAM: PORTABLE CHEST 1 VIEW  COMPARISON:  03/18/2024 FINDINGS: Stable cardiac enlargement. Very low lung volumes bilaterally. Bibasilar opacities likely represent atelectasis. Difficult to exclude component of pneumonia at either lung base. Difficult to exclude mild underlying pulmonary interstitial edema. No significant pleural fluid or evidence of pneumothorax. The visualized skeletal structures are unremarkable. IMPRESSION: Very low lung volumes with bibasilar opacities likely representing atelectasis. Difficult to exclude component of pneumonia at either lung base. Difficult to exclude mild underlying pulmonary interstitial edema. Electronically Signed   By: Marcey Moan M.D.   On: 08/11/2024 18:29   OUtpt HD orders:  [facility] TTS 4:15 450/800 2K/2.5 Ca EDW 108kg AVF LUE 11/24 HB 10.9, mircera 150 11/22 now ESA off 11/11 PTH 559, Ca 7.6, Phos 10.5 Hectorol  2 three times per week No heparin   Assessment/PlanJody L [name] is an 68 y.o. male  with ESRD on HD TTS, HTN, T2DM, PAD (s/p B BKA), COPD, A fib, CAD s/p DES 11/2023 who was admitted with hypoxia and volume overload  **Acute hypoxic respiratory failure secondary to volume overload in setting of missed HD:  extra HD for volume today - 4L UFG, then will have usual HD tomorrow - TBD  here vs outpt. Na/fluid restriction discussed.  Anuric, no role for diuretics  **ESRD on HD TTS: As above extra HD for volume today but then resume usual TTS tomorrow.  **Hyperkalemia:  K 6 this AM, HD will fix, renal diet.    **Anemia:  Hb > 10, no role for ESA currently.    **DM: anemia per primary  **A fib:  on eliquis  though INR was 2.6 - team clarifying meds.   **Chronic pain: on opioid therapy chronically.   **Hand wounds: appear chronic, on dorsal side of hand and hand warm - do not think steal but will need to be watched with low threshold for steal eval in future.   Will follow - if weaned to RA can likely be d/c'd today to resume outpt HD tomorrow.  If cont with O2 requirement would keep him hospitalized for now.  He says he's agreeable to this but really wants to go back to SNF today if possible.   [name] 08/12/2024, 8:09 AM

## 2024-08-12 NOTE — Progress Notes (Signed)
  Received patient in bed to unit.   Informed consent signed and in chart.    TX duration:3.5     Transported by  Hand-off given to patient's nurse.    Access used: Left AVF Access issues: None   Total UF removed: 3900 Medication(s) given: None Post HD VS: 130/62    Hunter Hacking LPN [facility]

## 2024-08-12 NOTE — Progress Notes (Signed)
 Patient stated that he had lost his cell phone, spoke to his wife she has his phone, money pouch, and cigarettes. His power chair is at nursing home.

## 2024-08-12 NOTE — Care Management Obs Status (Signed)
 MEDICARE OBSERVATION STATUS NOTIFICATION   Patient Details  Name: CHALES PELISSIER MRN: [id number] Date of Birth: Jan 27, 1956   Medicare Observation Status Notification Given:  Yes    Waddell Barnie Rama, RN 08/12/2024, 2:21 PM

## 2024-08-12 NOTE — Discharge Summary (Addendum)
 Physician Discharge Summary  [name] FMW:[id number] DOB: April 28, 1956 DOA: 08/11/2024  PCP: Montey Lot, PA-C  Admit date: 08/11/2024 Discharge date: 08/12/2024  Admitted From: Outpatient dialysis center Disposition: Skilled nursing facility  Recommendations for Outpatient Follow-up:  Routine dialysis tomorrow   Discharge Condition: Fair CODE STATUS: Full code Diet recommendation: Low-salt diet with fluid restriction  Discharge summary:  68 year old ESRD on hemodialysis TTS, type 2 diabetes, hypertension, peripheral artery disease status post bilateral BKA, COPD, chronic A-fib on Eliquis , coronary artery disease on Plavix  who received hemodialysis at outpatient clinic, removed 4.9 L and was noted to be hypoxemic at fluid overloaded and was sent to emergency room.  In the emergency room chest x-ray with poor inspiratory effort.  Afebrile.  Blood pressure stable.  On 2 L oxygen  saturating 100%.  Flu RSV and COVID-negative.  Potassium 6, hemoglobin 11.6. Patient admitted from the ER, underwent additional dialysis today morning with improvement of symptoms.  He is on room air.  3900 mL removed.  Patient is on room air.  Dialysis 12/2 at outpatient, dialysis 12/3 today in the hospital.  Patient is due to get dialysis tomorrow as outpatient.  With clinical stability, he is able to go back to a skilled nursing facility and resume outpatient dialysis as per nephrology recommendations. Continue all his long-term medical management including blood pressure medications, Eliquis  and Plavix .  Patient also on chronic pain management.  This will be continued.  Discharge Diagnoses:  Principal Problem:   Volume overload Active Problems:   ESRD (end stage renal disease) on dialysis (HCC)   CAD (dz of distal, mid and proximal RCA with implantation of 3 overlapping drug-eluting stent, with elevated troponin   Restless legs syndrome (RLS)   OSA not tolerating CPAP   Trifascicular block    Paroxysmal atrial fibrillation with RVR (HCC)   Current use of long term anticoagulation    Discharge Instructions  Discharge Instructions     Diet renal with fluid restriction   Complete by: As directed    Increase activity slowly   Complete by: As directed    No wound care   Complete by: As directed       Allergies as of 08/12/2024       Reactions   Contrast Media [iodinated Contrast Media] Shortness Of Breath, Other (See Comments)   Altered mental status   Ivp Dye [iodinated Contrast Media] Anaphylaxis, Shortness Of Breath, Other (See Comments)   Altered mental status    Latex Rash   A severe rash appears after the first 24 hours of being placed   Tape Rash, Other (See Comments)   Rash after 1 day of use Do not leave on longer then 3 days without changing        Medication List     TAKE these medications    amLODipine  10 MG tablet Commonly known as: NORVASC  Take 5 mg by mouth daily. What changed: Another medication with the same name was removed. Continue taking this medication, and follow the directions you see here.   apixaban  5 MG Tabs tablet Commonly known as: ELIQUIS  Take 1 tablet (5 mg total) by mouth 2 (two) times daily.   atorvastatin  80 MG tablet Commonly known as: LIPITOR  Take 1 tablet (80 mg total) by mouth at bedtime.   clopidogrel  75 MG tablet Commonly known as: PLAVIX  Take 1 tablet (75 mg total) by mouth daily with breakfast. What changed: when to take this   colchicine  0.6 MG tablet Take 0.5 tablets (0.3  mg total) by mouth daily.   Dialyvite /Zinc  Tabs Take 1 tablet by mouth See admin instructions. Give 1 tablet by mouth 3 days a week prior to dialysis on Tuesday, Thursday, Saturday.   famotidine  20 MG tablet Commonly known as: PEPCID  Take 20 mg by mouth See admin instructions. Give 1 tablet (20mg ) by mouth every other morning.   fentaNYL  50 MCG/HR Commonly known as: DURAGESIC  Place 1 patch onto the skin every 3 (three) days.    folic acid  1 MG tablet Commonly known as: FOLVITE  Take 1 mg by mouth daily.   gabapentin  100 MG capsule Commonly known as: NEURONTIN  Take 100 mg by mouth 3 (three) times daily.   ipratropium-albuterol  0.5-2.5 (3) MG/3ML Soln Commonly known as: DUONEB   isosorbide  mononitrate 60 MG 24 hr tablet Commonly known as: IMDUR  Take 60 mg by mouth daily. Give 60mg  by mouth one time a day   lidocaine -prilocaine  cream Commonly known as: EMLA  Apply 1 application  topically See admin instructions. Apply topically 3 days a week prior to dialysis on Tuesday, Thursday, Saturday.   loratadine  10 MG tablet Commonly known as: CLARITIN  Take 10 mg by mouth daily.   mineral oil-hydrophilic petrolatum ointment Apply 1 Application topically every 8 (eight) hours as needed for irritation or dry skin.   nitroGLYCERIN  0.4 MG SL tablet Commonly known as: NITROSTAT  Place 0.4 mg under the tongue every 5 (five) minutes as needed for chest pain.   NovoLOG  FlexPen 100 UNIT/ML FlexPen Generic drug: insulin  aspart Inject 4-16 Units into the skin See admin instructions. Inject 4 units subcutaneously three times daily plus 0-12 units per sliding scale: 70 - 150 : 0 units 151-200 : 2 units 201-250 : 4 units 251-300 : 6 units 301-350 : 8 units 351-400 : 10 units 401-450 : 12 units > 400 : give 12 units and recheck in 1 hour - if still > 400, call MD/NP for further orders.   oxyCODONE -acetaminophen  7.5-325 MG tablet Commonly known as: Percocet  Take 1 tablet by mouth every 8 (eight) hours as needed for severe pain (pain score 7-10).   promethazine  12.5 MG suppository Commonly known as: PHENERGAN  Place 12.5 mg rectally daily as needed for nausea or vomiting.   rOPINIRole  2 MG tablet Commonly known as: REQUIP  Take 2 mg by mouth at bedtime.   sevelamer  carbonate 800 MG tablet Commonly known as: RENVELA  Take 2 tablets (1,600 mg total) by mouth 3 (three) times daily with meals.   tamsulosin  0.4 MG Caps  capsule Commonly known as: FLOMAX  Take 0.4 mg by mouth daily.        Contact information for after-discharge care     Destination     [place] .   Service: Skilled Nursing Contact information: [address] [place]  [address] [phone]                    Allergies  Allergen Reactions   Contrast Media [Iodinated Contrast Media] Shortness Of Breath and Other (See Comments)    Altered mental status     Ivp Dye [Iodinated Contrast Media] Anaphylaxis, Shortness Of Breath and Other (See Comments)    Altered mental status     Latex Rash    A severe rash appears after the first 24 hours of being placed   Tape Rash and Other (See Comments)    Rash after 1 day of use  Do not leave on longer then 3 days without changing    Consultations: Nephrology  Procedures/Studies: DG Chest Port 1 View Result Date: 08/11/2024 CLINICAL DATA:  Shortness of breath. EXAM: PORTABLE CHEST 1 VIEW COMPARISON:  03/18/2024 FINDINGS: Stable cardiac enlargement. Very low lung volumes bilaterally. Bibasilar opacities likely represent atelectasis. Difficult to exclude component of pneumonia at either lung base. Difficult to exclude mild underlying pulmonary interstitial edema. No significant pleural fluid or evidence of pneumothorax. The visualized skeletal structures are unremarkable. IMPRESSION: Very low lung volumes with bibasilar opacities likely representing atelectasis. Difficult to exclude component of pneumonia at either lung base. Difficult to exclude mild underlying pulmonary interstitial edema. Electronically Signed   By: Marcey Moan M.D.   On: 08/11/2024 18:29   (Echo, Carotid, EGD, Colonoscopy, ERCP)    Subjective: Patient seen today receiving dialysis.  Denies any complaints.  He feels comfortable and wants to [facility].  Patient tells me that he is anyway getting more dialysis tomorrow so he does not need to stay in  the hospital.   Discharge Exam: Vitals:   08/12/24 1158 08/12/24 1201  BP: 136/69 130/62  Pulse: 95 96  Resp: 19 15  Temp: 98.4 F (36.9 C)   SpO2: 98% 96%   Vitals:   08/12/24 1100 08/12/24 1130 08/12/24 1158 08/12/24 1201  BP: 133/62 (!) 155/86 136/69 130/62  Pulse: 95 99 95 96  Resp: 17 18 19 15   Temp:   98.4 F (36.9 C)   TempSrc:      SpO2: 97% 99% 98% 96%  Weight:      Height:        General: Pt is alert, awake, not in acute distress.  Pleasant.  Receiving dialysis. Cardiovascular: RRR, S1/S2 +, no rubs, no gallops Respiratory: CTA bilaterally, no wheezing, no rhonchi Abdominal: Soft, NT, ND, bowel sounds + Extremities: Bilateral BKA stump clean and dry. Left upper extremity AV fistula present.    The results of significant diagnostics from this hospitalization (including imaging, microbiology, ancillary and laboratory) are listed below for reference.     Microbiology: Recent Results (from the past 240 hours)  Resp panel by RT-PCR (RSV, Flu A&B, Covid) Anterior Nasal Swab     Status: None   Collection Time: 08/11/24  9:12 PM   Specimen: Anterior Nasal Swab  Result Value Ref Range Status   SARS Coronavirus 2 by RT PCR NEGATIVE NEGATIVE Final   Influenza A by PCR NEGATIVE NEGATIVE Final   Influenza B by PCR NEGATIVE NEGATIVE Final    Comment: (NOTE) The Xpert Xpress SARS-CoV-2/FLU/RSV plus assay is intended as an aid in the diagnosis of influenza from Nasopharyngeal swab specimens and should not be used as a sole basis for treatment. Nasal washings and aspirates are unacceptable for Xpert Xpress SARS-CoV-2/FLU/RSV testing.  Fact Sheet for Patients: bloggercourse.com  Fact Sheet for Healthcare Providers: seriousbroker.it  This test is not yet approved or cleared by the [place]  FDA and has been authorized for detection and/or diagnosis of SARS-CoV-2 by FDA under an Emergency Use Authorization (EUA).  This EUA will remain in effect (meaning this test can be used) for the duration of the COVID-19 declaration under Section 564(b)(1) of the Act, 21 U.S.C. section 360bbb-3(b)(1), unless the authorization is terminated or revoked.     Resp Syncytial Virus by PCR NEGATIVE NEGATIVE Final    Comment: (NOTE) Fact Sheet for Patients: bloggercourse.com  Fact Sheet for Healthcare Providers: seriousbroker.it  This test is not yet approved or cleared by the [place]  FDA and has been authorized for detection  and/or diagnosis of SARS-CoV-2 by FDA under an Emergency Use Authorization (EUA). This EUA will remain in effect (meaning this test can be used) for the duration of the COVID-19 declaration under Section 564(b)(1) of the Act, 21 U.S.C. section 360bbb-3(b)(1), unless the authorization is terminated or revoked.  Performed at [facility], [address], [place], [place] [address]   Blood culture (routine x 2)     Status: None (Preliminary result)   Collection Time: 08/11/24 11:12 PM   Specimen: BLOOD  Result Value Ref Range Status   Specimen Description BLOOD RIGHT ANTECUBITAL  Final   Special Requests   Final    BOTTLES DRAWN AEROBIC AND ANAEROBIC Blood Culture results may not be optimal due to an inadequate volume of blood received in culture bottles   Culture   Final    NO GROWTH < 12 HOURS Performed at [facility], [address], [place], [place] [address]    Report Status PENDING  Incomplete  Blood culture (routine x 2)     Status: None (Preliminary result)   Collection Time: 08/12/24  3:45 AM   Specimen: BLOOD RIGHT HAND  Result Value Ref Range Status   Specimen Description BLOOD RIGHT HAND  Final   Special Requests   Final    BOTTLES DRAWN AEROBIC AND ANAEROBIC Blood Culture results may not be optimal due to an inadequate volume of blood received in culture bottles   Culture   Final    NO GROWTH < 12  HOURS Performed at [facility], [address], [place], [place] [address]    Report Status PENDING  Incomplete     Labs: BNP (last 3 results) Recent Labs    02/12/24 1638  BNP 387.9*   Basic Metabolic Panel: Recent Labs  Lab 08/11/24 2017 08/12/24 0214  NA 134* 136  K 5.5* 6.0*  CL 92* 91*  CO2 26 25  GLUCOSE 129* 217*  BUN 47* 51*  CREATININE 7.20* 7.85*  CALCIUM  8.7* 8.3*   Liver Function Tests: No results for input(s): AST, ALT, ALKPHOS, BILITOT, PROT, ALBUMIN  in the last 168 hours. No results for input(s): LIPASE, AMYLASE in the last 168 hours. No results for input(s): AMMONIA in the last 168 hours. CBC: Recent Labs  Lab 08/11/24 2017 08/12/24 0214  WBC 7.0 5.9  NEUTROABS 4.7  --   HGB 12.3* 11.6*  HCT 40.7 37.6*  MCV 91.9 91.7  PLT 150 144*   Cardiac Enzymes: No results for input(s): CKTOTAL, CKMB, CKMBINDEX, TROPONINI in the last 168 hours. BNP: Invalid input(s): POCBNP CBG: No results for input(s): GLUCAP in the last 168 hours. D-Dimer No results for input(s): DDIMER in the last 72 hours. Hgb A1c No results for input(s): HGBA1C in the last 72 hours. Lipid Profile No results for input(s): CHOL, HDL, LDLCALC, TRIG, CHOLHDL, LDLDIRECT in the last 72 hours. Thyroid  function studies No results for input(s): TSH, T4TOTAL, T3FREE, THYROIDAB in the last 72 hours.  Invalid input(s): FREET3 Anemia work up No results for input(s): VITAMINB12, FOLATE, FERRITIN, TIBC, IRON, RETICCTPCT in the last 72 hours. Urinalysis    Component Value Date/Time   COLORURINE YELLOW 03/24/2024 2122   APPEARANCEUR HAZY (A) 03/24/2024 2122   LABSPEC 1.010 03/24/2024 2122   PHURINE 7.0 03/24/2024 2122   GLUCOSEU >=500 (A) 03/24/2024 2122   HGBUR NEGATIVE 03/24/2024 2122   BILIRUBINUR NEGATIVE 03/24/2024 2122   KETONESUR NEGATIVE 03/24/2024 2122   PROTEINUR >=300 (A) 03/24/2024 2122   UROBILINOGEN  0.2 01/14/2015 0346   NITRITE NEGATIVE 03/24/2024 2122   LEUKOCYTESUR LARGE (A) 03/24/2024 2122   Sepsis Labs Recent Labs  Lab 08/11/24 2017 08/12/24 0214  WBC 7.0 5.9   Microbiology Recent Results (from the past 240 hours)  Resp panel by RT-PCR (RSV, Flu A&B, Covid) Anterior Nasal Swab     Status: None   Collection Time: 08/11/24  9:12 PM   Specimen: Anterior Nasal Swab  Result Value Ref Range Status   SARS Coronavirus 2 by RT PCR NEGATIVE NEGATIVE Final   Influenza A by PCR NEGATIVE NEGATIVE Final   Influenza B by PCR NEGATIVE NEGATIVE Final    Comment: (NOTE) The Xpert Xpress SARS-CoV-2/FLU/RSV plus assay is intended as an aid in the diagnosis of influenza from Nasopharyngeal swab specimens and should not be used as a sole basis for treatment. Nasal washings and aspirates are unacceptable for Xpert Xpress SARS-CoV-2/FLU/RSV testing.  Fact Sheet for Patients: bloggercourse.com  Fact Sheet for Healthcare Providers: seriousbroker.it  This test is not yet approved or cleared by the [place]  FDA and has been authorized for detection and/or diagnosis of SARS-CoV-2 by FDA under an Emergency Use Authorization (EUA). This EUA will remain in effect (meaning this test can be used) for the duration of the COVID-19 declaration under Section 564(b)(1) of the Act, 21 U.S.C. section 360bbb-3(b)(1), unless the authorization is terminated or revoked.     Resp Syncytial Virus by PCR NEGATIVE NEGATIVE Final    Comment: (NOTE) Fact Sheet for Patients: bloggercourse.com  Fact Sheet for Healthcare Providers: seriousbroker.it  This test is not yet approved or cleared by the [place]  FDA and has been authorized for detection and/or diagnosis of SARS-CoV-2 by FDA under an Emergency Use Authorization (EUA). This EUA will remain in effect (meaning this test can be used) for the  duration of the COVID-19 declaration under Section 564(b)(1) of the Act, 21 U.S.C. section 360bbb-3(b)(1), unless the authorization is terminated or revoked.  Performed at [facility], [address] [place], [place] [address]   Blood culture (routine x 2)     Status: None (Preliminary result)   Collection Time: 08/11/24 11:12 PM   Specimen: BLOOD  Result Value Ref Range Status   Specimen Description BLOOD RIGHT ANTECUBITAL  Final   Special Requests   Final    BOTTLES DRAWN AEROBIC AND ANAEROBIC Blood Culture results may not be optimal due to an inadequate volume of blood received in culture bottles   Culture   Final    NO GROWTH < 12 HOURS Performed at [facility], [address] [place], [place] [address]    Report Status PENDING  Incomplete  Blood culture (routine x 2)     Status: None (Preliminary result)   Collection Time: 08/12/24  3:45 AM   Specimen: BLOOD RIGHT HAND  Result Value Ref Range Status   Specimen Description BLOOD RIGHT HAND  Final   Special Requests   Final    BOTTLES DRAWN AEROBIC AND ANAEROBIC Blood Culture results may not be optimal due to an inadequate volume of blood received in culture bottles   Culture   Final    NO GROWTH < 12 HOURS Performed at [facility], [address] [place], [place] [address]    Report Status PENDING  Incomplete     Time coordinating discharge: 32 minutes  SIGNED:   Renato Applebaum, MD  [facility] 08/12/2024, 1:07 PM

## 2024-08-12 NOTE — TOC Transition Note (Signed)
 Transition of Care [facility]) - Discharge Note   Patient Details  Name: [name] MRN: [id number] Date of Birth: 1956-07-04  [facility] CM/SW Contact:  Luise JAYSON Pan, LCSWA Phone Number: 08/12/2024, 2:57 PM   Clinical Narrative:   Patient will DC to: [facility] Anticipated DC date: 08/12/24  Family notified: ,Katheryn Pollen (Spouse) [phone]   Transport by: [facility]   Per MD patient ready for DC to [facility]. RN to call report prior to discharge [phone]). RN, patient, patient's family, and facility notified of DC. Discharge Summary and FL2 sent to facility. DC packet on chart. Ambulance transport requested for patient 2:56PM.   CSW will sign off for now as social work intervention is no longer needed. Please consult us  again if new needs arise.    Final next level of care: Skilled Nursing Facility Barriers to Discharge: Barriers Resolved   Patient Goals and CMS Choice Patient states their goals for this hospitalization and ongoing recovery are:: Back to ltc   Choice offered to / list presented to : NA      Discharge Placement   Existing PASRR number confirmed : 08/12/24          Patient chooses bed at: [facility] Patient to be transferred to facility by: [facility] Name of family member notified: [name], [name] (Spouse) [phone] Patient and family notified of of transfer: 08/12/24  Discharge Plan and Services Additional resources added to the After Visit Summary for   In-house Referral: Clinical Social Work   Post Acute Care Choice: Nursing Home                               Social Drivers of Health (SDOH) Interventions SDOH Screenings   Food Insecurity: No Food Insecurity (03/25/2024)  Housing: Low Risk  (03/25/2024)  Transportation Needs: No Transportation Needs (03/25/2024)  Utilities: Not At Risk (03/25/2024)  Social Connections: Moderately Isolated (03/25/2024)  Tobacco Use: High Risk (08/11/2024)      Readmission Risk Interventions    08/12/2024    1:17 PM 05/29/2023    4:41 PM  Readmission Risk Prevention Plan  Transportation Screening Complete Complete  Medication Review [facility]) Complete Complete  PCP or Specialist appointment within 3-5 days of discharge  Complete  HRI or Home Care Consult Complete Complete  SW Recovery Care/Counseling Consult  Complete  Palliative Care Screening Not Applicable Not Applicable  Skilled Nursing Facility Not Applicable Complete

## 2024-08-12 NOTE — [place] FL2 (Signed)
 [place]  MEDICAID FL2 LEVEL OF CARE FORM     IDENTIFICATION  Patient Name: [name] Birthdate: 07/17/1956 Sex: male Admission Date (Current Location): 08/11/2024  [place] and [place] Number:  [place] [id number] R Facility and Address:  [facility], [address] [place], [place] [address]      Provider Number: [id number]  Attending Physician Name and Address:  Raenelle Coria, MD  Relative Name and Phone Number:  Rollins, Wrightson (Spouse) [phone] (Mobile)    Current Level of Care: Hospital Recommended Level of Care: Skilled Nursing Facility Prior Approval Number:    Date Approved/Denied:   PASRR Number: [id number]  Discharge Plan: SNF    Current Diagnoses: Patient Active Problem List   Diagnosis Date Noted   Volume overload 08/11/2024   Non compliance with medical treatment 03/28/2024   Decubitus ulcer of sacral region, stage 2 (HCC) 03/25/2024   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/2024   Obesity, Class I, BMI 30-34.9 03/25/2024   Nonrheumatic aortic (valve) stenosis 03/18/2024   History of Clostridioides difficile colitis 02/12/2024   BPH (benign prostatic hyperplasia) 02/12/2024   ESRD (end stage renal disease) on dialysis (HCC) 07/19/2023   History of right coronary artery stent placement 03/31/2023   Current use of long term anticoagulation 03/31/2023   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) 03/31/2023   Paroxysmal atrial fibrillation (HCC) 03/31/2023   Anemia due to chronic kidney disease 01/13/2022   Obesity (BMI 30-39.9) 01/13/2022   Proliferative diabetic retinopathy of left eye with macular edema associated with type 2 diabetes mellitus (HCC) 11/27/2021   Paroxysmal atrial fibrillation with RVR (HCC) 06/14/2021   Carotid stenosis 06/15/2020   Dyslipidemia 06/15/2020   Diabetic macular edema of right eye with proliferative retinopathy associated with type 2 diabetes mellitus  (HCC) 01/18/2020   Severe nonproliferative diabetic retinopathy of left eye, with macular edema, associated with type 2 diabetes mellitus (HCC) 01/04/2020   Retinal hemorrhage of right eye 01/04/2020   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   S/P carotid endarterectomy 09/18/2018   OSA not tolerating CPAP 09/18/2018   Chronic pain 09/18/2018   Peripheral artery disease 11/12/2016   Unilateral primary osteoarthritis, left knee 10/11/2016   Type 2 diabetes mellitus with diabetic neuropathy (HCC) 05/09/2016   CAD (dz of distal, mid and proximal RCA with implantation of 3 overlapping drug-eluting stent, with elevated troponin 09/03/2015   (HFpEF) heart failure with preserved ejection fraction (HCC) 09/03/2015   Hyperlipidemia 08/25/2014   Limb pain 03/20/2013   Restless legs syndrome (RLS) 09/25/2012   COPD (chronic obstructive pulmonary disease) (HCC) 04/25/2012   Hypokalemia 11/01/2011   Occlusion and stenosis of carotid artery without mention of cerebral infarction 06/12/2011   GERD (gastroesophageal reflux disease) 05/08/2011   Barrett's esophagus without dysplasia 05/08/2011   Former tobacco use 02/01/2011    Orientation RESPIRATION BLADDER Height & Weight     Self, Time, Situation, Place  Normal (Room Air) Continent Weight: 254 lb (115.2 kg) Height:  6' 2 (188 cm)  BEHAVIORAL SYMPTOMS/MOOD NEUROLOGICAL BOWEL NUTRITION STATUS      Continent Diet (Please see dischagre summary)  AMBULATORY STATUS COMMUNICATION OF NEEDS Skin     Verbally Normal                       Personal Care Assistance Level of Assistance  Bathing, Feeding, Dressing           Functional Limitations Info  SPECIAL CARE FACTORS FREQUENCY                       Contractures Contractures Info: Not present    Additional Factors Info  Code Status, Allergies, Insulin  Sliding Scale Code Status Info: Full Code Allergies Info: Contrast Media (iodinated Contrast Media); Ivp Dye (iodinated  Contrast Media); Latex; Tape   Insulin  Sliding Scale Info: Please see discharge summary       Current Medications (08/12/2024):  This is the current hospital active medication list Current Facility-Administered Medications  Medication Dose Route Frequency Provider Last Rate Last Admin   apixaban  (ELIQUIS ) tablet 5 mg  5 mg Oral BID Claiborne, Claudia, MD       Chlorhexidine  Gluconate Cloth 2 % PADS 6 each  6 each Topical Q0600 Macel Jayson PARAS, MD       famotidine  (PEPCID ) tablet 20 mg  20 mg Oral QODAY Claiborne, Claudia, MD       insulin  aspart (novoLOG ) injection 0-6 Units  0-6 Units Subcutaneous TID WC Claiborne, Claudia, MD       ondansetron  (ZOFRAN ) tablet 4 mg  4 mg Oral Q6H PRN Arthea Child, MD       Or   ondansetron  (ZOFRAN ) injection 4 mg  4 mg Intravenous Q6H PRN Arthea Child, MD       sevelamer  carbonate (RENVELA ) tablet 1,600 mg  1,600 mg Oral TID WC Arthea Child, MD       sodium chloride  flush (NS) 0.9 % injection 3 mL  3 mL Intravenous Q12H Claiborne, Claudia, MD   3 mL at 08/12/24 0123   tamsulosin  (FLOMAX ) capsule 0.4 mg  0.4 mg Oral Daily Claiborne, Claudia, MD         Discharge Medications: Please see discharge summary for a list of discharge medications.  Relevant Imaging Results:  Relevant Lab Results:   Additional Information ss#[id number].   HD patient [facility] [place] on TTS)  Maguire Sime E Apple Dearmas, LCSWA

## 2024-08-12 NOTE — Plan of Care (Signed)
 [facility] Patient Discharge Orders- [facility]: [facility]  Patient's name: [name] Admit/DC Dates: 08/11/2024 - 08/12/2024  Discharge Diagnoses: AHRF/Volume overload. Extra HD today with 4L UF   Outpatient Dialysis Orders:  -Heparin : No change  -EDW No change 108kg   -[place]: No change  Anemia Aranesp : Given: --   Date of last dose/amount: --   PRBC's Given: -- Date/# of units: -- ESA dose for discharge: per protocol   Recent Labs  Lab 08/12/24 0214  HGB 11.6*  K 6.0*  CALCIUM  8.3*   Access intervention/Change: None    Medications: -IV Antibiotics:  --   OTHER/APPTS/LABS    Completed by: Maisie Ronnald Acosta PA-C   D/C Meds to be reconciled by nurse after every discharge.    Reviewed by: MD:______ RN_______

## 2024-08-13 LAB — HEPATITIS B SURFACE ANTIBODY, QUANTITATIVE: Hep B S AB Quant (Post): 13.3 m[IU]/mL

## 2024-08-16 LAB — CULTURE, BLOOD (ROUTINE X 2): Culture: NO GROWTH

## 2024-08-17 DIAGNOSIS — E113511 Type 2 diabetes mellitus with proliferative diabetic retinopathy with macular edema, right eye: Secondary | ICD-10-CM | POA: Diagnosis not present

## 2024-08-17 DIAGNOSIS — E113512 Type 2 diabetes mellitus with proliferative diabetic retinopathy with macular edema, left eye: Secondary | ICD-10-CM | POA: Diagnosis not present

## 2024-08-17 DIAGNOSIS — H43813 Vitreous degeneration, bilateral: Secondary | ICD-10-CM | POA: Diagnosis not present

## 2024-08-17 DIAGNOSIS — Z794 Long term (current) use of insulin: Secondary | ICD-10-CM | POA: Diagnosis not present

## 2024-08-17 DIAGNOSIS — H35371 Puckering of macula, right eye: Secondary | ICD-10-CM | POA: Diagnosis not present

## 2024-08-17 LAB — CULTURE, BLOOD (ROUTINE X 2): Culture: NO GROWTH

## 2024-08-25 ENCOUNTER — Inpatient Hospital Stay (HOSPITAL_COMMUNITY)
Admission: EM | Admit: 2024-08-25 | Discharge: 2024-09-02 | DRG: 917 | Disposition: A | Discharge status: Skilled Nursing Facility | Source: Other Acute Inpatient Hospital | Attending: Family Medicine | Admitting: Family Medicine

## 2024-08-25 ENCOUNTER — Emergency Department (HOSPITAL_COMMUNITY)

## 2024-08-25 ENCOUNTER — Inpatient Hospital Stay (HOSPITAL_COMMUNITY)

## 2024-08-25 DIAGNOSIS — I4891 Unspecified atrial fibrillation: Secondary | ICD-10-CM

## 2024-08-25 DIAGNOSIS — M19042 Primary osteoarthritis, left hand: Secondary | ICD-10-CM | POA: Diagnosis present

## 2024-08-25 DIAGNOSIS — Z66 Do not resuscitate: Secondary | ICD-10-CM | POA: Diagnosis present

## 2024-08-25 DIAGNOSIS — Z4682 Encounter for fitting and adjustment of non-vascular catheter: Secondary | ICD-10-CM | POA: Diagnosis not present

## 2024-08-25 DIAGNOSIS — Y92129 Unspecified place in nursing home as the place of occurrence of the external cause: Secondary | ICD-10-CM | POA: Diagnosis not present

## 2024-08-25 DIAGNOSIS — G2581 Restless legs syndrome: Secondary | ICD-10-CM | POA: Diagnosis present

## 2024-08-25 DIAGNOSIS — Z972 Presence of dental prosthetic device (complete) (partial): Secondary | ICD-10-CM

## 2024-08-25 DIAGNOSIS — E1152 Type 2 diabetes mellitus with diabetic peripheral angiopathy with gangrene: Secondary | ICD-10-CM | POA: Diagnosis present

## 2024-08-25 DIAGNOSIS — Z992 Dependence on renal dialysis: Secondary | ICD-10-CM | POA: Diagnosis not present

## 2024-08-25 DIAGNOSIS — Z7902 Long term (current) use of antithrombotics/antiplatelets: Secondary | ICD-10-CM | POA: Diagnosis not present

## 2024-08-25 DIAGNOSIS — Z6836 Body mass index (BMI) 36.0-36.9, adult: Secondary | ICD-10-CM

## 2024-08-25 DIAGNOSIS — R1310 Dysphagia, unspecified: Secondary | ICD-10-CM | POA: Diagnosis present

## 2024-08-25 DIAGNOSIS — Z7951 Long term (current) use of inhaled steroids: Secondary | ICD-10-CM

## 2024-08-25 DIAGNOSIS — G928 Other toxic encephalopathy: Secondary | ICD-10-CM | POA: Diagnosis present

## 2024-08-25 DIAGNOSIS — M19072 Primary osteoarthritis, left ankle and foot: Secondary | ICD-10-CM | POA: Diagnosis present

## 2024-08-25 DIAGNOSIS — Z981 Arthrodesis status: Secondary | ICD-10-CM

## 2024-08-25 DIAGNOSIS — I5032 Chronic diastolic (congestive) heart failure: Secondary | ICD-10-CM | POA: Diagnosis present

## 2024-08-25 DIAGNOSIS — T402X1A Poisoning by other opioids, accidental (unintentional), initial encounter: Secondary | ICD-10-CM

## 2024-08-25 DIAGNOSIS — Z8249 Family history of ischemic heart disease and other diseases of the circulatory system: Secondary | ICD-10-CM

## 2024-08-25 DIAGNOSIS — M19071 Primary osteoarthritis, right ankle and foot: Secondary | ICD-10-CM | POA: Diagnosis present

## 2024-08-25 DIAGNOSIS — T402X5A Adverse effect of other opioids, initial encounter: Secondary | ICD-10-CM | POA: Diagnosis present

## 2024-08-25 DIAGNOSIS — J381 Polyp of vocal cord and larynx: Secondary | ICD-10-CM | POA: Diagnosis present

## 2024-08-25 DIAGNOSIS — Z89511 Acquired absence of right leg below knee: Secondary | ICD-10-CM

## 2024-08-25 DIAGNOSIS — I214 Non-ST elevation (NSTEMI) myocardial infarction: Secondary | ICD-10-CM

## 2024-08-25 DIAGNOSIS — Z9049 Acquired absence of other specified parts of digestive tract: Secondary | ICD-10-CM

## 2024-08-25 DIAGNOSIS — J189 Pneumonia, unspecified organism: Secondary | ICD-10-CM | POA: Diagnosis not present

## 2024-08-25 DIAGNOSIS — I2510 Atherosclerotic heart disease of native coronary artery without angina pectoris: Secondary | ICD-10-CM | POA: Diagnosis present

## 2024-08-25 DIAGNOSIS — Z89512 Acquired absence of left leg below knee: Secondary | ICD-10-CM

## 2024-08-25 DIAGNOSIS — Z7901 Long term (current) use of anticoagulants: Secondary | ICD-10-CM

## 2024-08-25 DIAGNOSIS — I959 Hypotension, unspecified: Secondary | ICD-10-CM | POA: Diagnosis not present

## 2024-08-25 DIAGNOSIS — I21A1 Myocardial infarction type 2: Secondary | ICD-10-CM | POA: Diagnosis present

## 2024-08-25 DIAGNOSIS — E785 Hyperlipidemia, unspecified: Secondary | ICD-10-CM

## 2024-08-25 DIAGNOSIS — Z8673 Personal history of transient ischemic attack (TIA), and cerebral infarction without residual deficits: Secondary | ICD-10-CM

## 2024-08-25 DIAGNOSIS — M869 Osteomyelitis, unspecified: Secondary | ICD-10-CM | POA: Diagnosis not present

## 2024-08-25 DIAGNOSIS — E669 Obesity, unspecified: Secondary | ICD-10-CM | POA: Diagnosis present

## 2024-08-25 DIAGNOSIS — R571 Hypovolemic shock: Secondary | ICD-10-CM | POA: Diagnosis present

## 2024-08-25 DIAGNOSIS — G9340 Encephalopathy, unspecified: Secondary | ICD-10-CM | POA: Diagnosis not present

## 2024-08-25 DIAGNOSIS — Z800 Family history of malignant neoplasm of digestive organs: Secondary | ICD-10-CM

## 2024-08-25 DIAGNOSIS — J449 Chronic obstructive pulmonary disease, unspecified: Secondary | ICD-10-CM | POA: Diagnosis present

## 2024-08-25 DIAGNOSIS — Z95828 Presence of other vascular implants and grafts: Secondary | ICD-10-CM

## 2024-08-25 DIAGNOSIS — I480 Paroxysmal atrial fibrillation: Secondary | ICD-10-CM | POA: Diagnosis not present

## 2024-08-25 DIAGNOSIS — L8990 Pressure ulcer of unspecified site, unspecified stage: Secondary | ICD-10-CM | POA: Insufficient documentation

## 2024-08-25 DIAGNOSIS — Z91048 Other nonmedicinal substance allergy status: Secondary | ICD-10-CM

## 2024-08-25 DIAGNOSIS — Z8719 Personal history of other diseases of the digestive system: Secondary | ICD-10-CM

## 2024-08-25 DIAGNOSIS — J386 Stenosis of larynx: Secondary | ICD-10-CM | POA: Diagnosis present

## 2024-08-25 DIAGNOSIS — E1122 Type 2 diabetes mellitus with diabetic chronic kidney disease: Secondary | ICD-10-CM | POA: Diagnosis not present

## 2024-08-25 DIAGNOSIS — R4189 Other symptoms and signs involving cognitive functions and awareness: Secondary | ICD-10-CM | POA: Diagnosis not present

## 2024-08-25 DIAGNOSIS — R0989 Other specified symptoms and signs involving the circulatory and respiratory systems: Secondary | ICD-10-CM | POA: Diagnosis not present

## 2024-08-25 DIAGNOSIS — R059 Cough, unspecified: Secondary | ICD-10-CM | POA: Diagnosis not present

## 2024-08-25 DIAGNOSIS — E861 Hypovolemia: Secondary | ICD-10-CM | POA: Diagnosis not present

## 2024-08-25 DIAGNOSIS — R069 Unspecified abnormalities of breathing: Secondary | ICD-10-CM | POA: Diagnosis not present

## 2024-08-25 DIAGNOSIS — Z9089 Acquired absence of other organs: Secondary | ICD-10-CM

## 2024-08-25 DIAGNOSIS — Z79899 Other long term (current) drug therapy: Secondary | ICD-10-CM

## 2024-08-25 DIAGNOSIS — E872 Acidosis, unspecified: Secondary | ICD-10-CM | POA: Diagnosis present

## 2024-08-25 DIAGNOSIS — N186 End stage renal disease: Secondary | ICD-10-CM | POA: Diagnosis not present

## 2024-08-25 DIAGNOSIS — I132 Hypertensive heart and chronic kidney disease with heart failure and with stage 5 chronic kidney disease, or end stage renal disease: Secondary | ICD-10-CM | POA: Diagnosis present

## 2024-08-25 DIAGNOSIS — I252 Old myocardial infarction: Secondary | ICD-10-CM

## 2024-08-25 DIAGNOSIS — M545 Low back pain, unspecified: Secondary | ICD-10-CM | POA: Diagnosis present

## 2024-08-25 DIAGNOSIS — I358 Other nonrheumatic aortic valve disorders: Secondary | ICD-10-CM | POA: Diagnosis present

## 2024-08-25 DIAGNOSIS — G9389 Other specified disorders of brain: Secondary | ICD-10-CM | POA: Diagnosis present

## 2024-08-25 DIAGNOSIS — I5030 Unspecified diastolic (congestive) heart failure: Secondary | ICD-10-CM | POA: Diagnosis not present

## 2024-08-25 DIAGNOSIS — G929 Unspecified toxic encephalopathy: Secondary | ICD-10-CM | POA: Diagnosis not present

## 2024-08-25 DIAGNOSIS — D631 Anemia in chronic kidney disease: Secondary | ICD-10-CM | POA: Diagnosis present

## 2024-08-25 DIAGNOSIS — M85841 Other specified disorders of bone density and structure, right hand: Secondary | ICD-10-CM | POA: Diagnosis not present

## 2024-08-25 DIAGNOSIS — Z8042 Family history of malignant neoplasm of prostate: Secondary | ICD-10-CM

## 2024-08-25 DIAGNOSIS — Z9889 Other specified postprocedural states: Secondary | ICD-10-CM

## 2024-08-25 DIAGNOSIS — K760 Fatty (change of) liver, not elsewhere classified: Secondary | ICD-10-CM | POA: Diagnosis present

## 2024-08-25 DIAGNOSIS — I517 Cardiomegaly: Secondary | ICD-10-CM | POA: Diagnosis not present

## 2024-08-25 DIAGNOSIS — M19041 Primary osteoarthritis, right hand: Secondary | ICD-10-CM | POA: Diagnosis not present

## 2024-08-25 DIAGNOSIS — G4733 Obstructive sleep apnea (adult) (pediatric): Secondary | ICD-10-CM | POA: Diagnosis present

## 2024-08-25 DIAGNOSIS — Z7189 Other specified counseling: Secondary | ICD-10-CM | POA: Diagnosis not present

## 2024-08-25 DIAGNOSIS — M7989 Other specified soft tissue disorders: Secondary | ICD-10-CM | POA: Diagnosis not present

## 2024-08-25 DIAGNOSIS — Z803 Family history of malignant neoplasm of breast: Secondary | ICD-10-CM

## 2024-08-25 DIAGNOSIS — I452 Bifascicular block: Secondary | ICD-10-CM | POA: Diagnosis present

## 2024-08-25 DIAGNOSIS — I350 Nonrheumatic aortic (valve) stenosis: Secondary | ICD-10-CM | POA: Diagnosis present

## 2024-08-25 DIAGNOSIS — Z96651 Presence of right artificial knee joint: Secondary | ICD-10-CM | POA: Diagnosis present

## 2024-08-25 DIAGNOSIS — Z91158 Patient's noncompliance with renal dialysis for other reason: Secondary | ICD-10-CM

## 2024-08-25 DIAGNOSIS — R404 Transient alteration of awareness: Secondary | ICD-10-CM | POA: Diagnosis not present

## 2024-08-25 DIAGNOSIS — N2581 Secondary hyperparathyroidism of renal origin: Secondary | ICD-10-CM | POA: Diagnosis present

## 2024-08-25 DIAGNOSIS — F17210 Nicotine dependence, cigarettes, uncomplicated: Secondary | ICD-10-CM | POA: Diagnosis present

## 2024-08-25 DIAGNOSIS — E119 Type 2 diabetes mellitus without complications: Secondary | ICD-10-CM

## 2024-08-25 DIAGNOSIS — Z8261 Family history of arthritis: Secondary | ICD-10-CM

## 2024-08-25 DIAGNOSIS — L89152 Pressure ulcer of sacral region, stage 2: Secondary | ICD-10-CM | POA: Diagnosis present

## 2024-08-25 DIAGNOSIS — R4182 Altered mental status, unspecified: Secondary | ICD-10-CM | POA: Diagnosis present

## 2024-08-25 DIAGNOSIS — R0603 Acute respiratory distress: Secondary | ICD-10-CM | POA: Diagnosis not present

## 2024-08-25 DIAGNOSIS — I482 Chronic atrial fibrillation, unspecified: Secondary | ICD-10-CM | POA: Diagnosis present

## 2024-08-25 DIAGNOSIS — I495 Sick sinus syndrome: Secondary | ICD-10-CM | POA: Diagnosis not present

## 2024-08-25 DIAGNOSIS — Z8701 Personal history of pneumonia (recurrent): Secondary | ICD-10-CM

## 2024-08-25 DIAGNOSIS — T40604A Poisoning by unspecified narcotics, undetermined, initial encounter: Secondary | ICD-10-CM

## 2024-08-25 DIAGNOSIS — R9431 Abnormal electrocardiogram [ECG] [EKG]: Secondary | ICD-10-CM | POA: Diagnosis not present

## 2024-08-25 DIAGNOSIS — M47812 Spondylosis without myelopathy or radiculopathy, cervical region: Secondary | ICD-10-CM | POA: Diagnosis present

## 2024-08-25 DIAGNOSIS — J811 Chronic pulmonary edema: Secondary | ICD-10-CM | POA: Diagnosis not present

## 2024-08-25 DIAGNOSIS — R52 Pain, unspecified: Secondary | ICD-10-CM | POA: Diagnosis not present

## 2024-08-25 DIAGNOSIS — I110 Hypertensive heart disease with heart failure: Secondary | ICD-10-CM

## 2024-08-25 DIAGNOSIS — I998 Other disorder of circulatory system: Secondary | ICD-10-CM | POA: Diagnosis present

## 2024-08-25 DIAGNOSIS — E875 Hyperkalemia: Secondary | ICD-10-CM | POA: Diagnosis present

## 2024-08-25 DIAGNOSIS — M85842 Other specified disorders of bone density and structure, left hand: Secondary | ICD-10-CM | POA: Diagnosis not present

## 2024-08-25 DIAGNOSIS — I213 ST elevation (STEMI) myocardial infarction of unspecified site: Secondary | ICD-10-CM | POA: Diagnosis not present

## 2024-08-25 DIAGNOSIS — Z87440 Personal history of urinary (tract) infections: Secondary | ICD-10-CM

## 2024-08-25 DIAGNOSIS — G9341 Metabolic encephalopathy: Secondary | ICD-10-CM

## 2024-08-25 DIAGNOSIS — T40411A Poisoning by fentanyl or fentanyl analogs, accidental (unintentional), initial encounter: Principal | ICD-10-CM | POA: Diagnosis present

## 2024-08-25 DIAGNOSIS — Z781 Physical restraint status: Secondary | ICD-10-CM

## 2024-08-25 DIAGNOSIS — I739 Peripheral vascular disease, unspecified: Secondary | ICD-10-CM | POA: Diagnosis not present

## 2024-08-25 DIAGNOSIS — J9601 Acute respiratory failure with hypoxia: Secondary | ICD-10-CM | POA: Diagnosis present

## 2024-08-25 DIAGNOSIS — R197 Diarrhea, unspecified: Secondary | ICD-10-CM | POA: Diagnosis present

## 2024-08-25 DIAGNOSIS — Z91040 Latex allergy status: Secondary | ICD-10-CM

## 2024-08-25 DIAGNOSIS — R578 Other shock: Secondary | ICD-10-CM | POA: Diagnosis present

## 2024-08-25 DIAGNOSIS — G8929 Other chronic pain: Secondary | ICD-10-CM

## 2024-08-25 DIAGNOSIS — M170 Bilateral primary osteoarthritis of knee: Secondary | ICD-10-CM | POA: Diagnosis present

## 2024-08-25 DIAGNOSIS — F17290 Nicotine dependence, other tobacco product, uncomplicated: Secondary | ICD-10-CM | POA: Diagnosis present

## 2024-08-25 DIAGNOSIS — E1142 Type 2 diabetes mellitus with diabetic polyneuropathy: Secondary | ICD-10-CM | POA: Diagnosis present

## 2024-08-25 DIAGNOSIS — Z833 Family history of diabetes mellitus: Secondary | ICD-10-CM

## 2024-08-25 DIAGNOSIS — Z794 Long term (current) use of insulin: Secondary | ICD-10-CM

## 2024-08-25 DIAGNOSIS — L98A322 Non-pressure chronic ulcer of left hand with fat layer exposed: Secondary | ICD-10-CM | POA: Diagnosis present

## 2024-08-25 DIAGNOSIS — Z83438 Family history of other disorder of lipoprotein metabolism and other lipidemia: Secondary | ICD-10-CM

## 2024-08-25 DIAGNOSIS — Z955 Presence of coronary angioplasty implant and graft: Secondary | ICD-10-CM

## 2024-08-25 DIAGNOSIS — Z8619 Personal history of other infectious and parasitic diseases: Secondary | ICD-10-CM

## 2024-08-25 DIAGNOSIS — Z91199 Patient's noncompliance with other medical treatment and regimen due to unspecified reason: Secondary | ICD-10-CM

## 2024-08-25 DIAGNOSIS — I3139 Other pericardial effusion (noninflammatory): Secondary | ICD-10-CM | POA: Diagnosis not present

## 2024-08-25 DIAGNOSIS — R0602 Shortness of breath: Secondary | ICD-10-CM | POA: Diagnosis not present

## 2024-08-25 DIAGNOSIS — Z91041 Radiographic dye allergy status: Secondary | ICD-10-CM

## 2024-08-25 DIAGNOSIS — K219 Gastro-esophageal reflux disease without esophagitis: Secondary | ICD-10-CM | POA: Diagnosis present

## 2024-08-25 LAB — COMPREHENSIVE METABOLIC PANEL WITH GFR
ALT: 19 U/L (ref 0–44)
AST: 38 U/L (ref 15–41)
Albumin: 3.7 g/dL (ref 3.5–5.0)
Alkaline Phosphatase: 135 U/L — ABNORMAL HIGH (ref 38–126)
Anion gap: 18 — ABNORMAL HIGH (ref 5–15)
BUN: 58 mg/dL — ABNORMAL HIGH (ref 8–23)
CO2: 21 mmol/L — ABNORMAL LOW (ref 22–32)
Calcium: 8.2 mg/dL — ABNORMAL LOW (ref 8.9–10.3)
Chloride: 95 mmol/L — ABNORMAL LOW (ref 98–111)
Creatinine, Ser: 7.16 mg/dL — ABNORMAL HIGH (ref 0.61–1.24)
GFR, Estimated: 8 mL/min — ABNORMAL LOW (ref >60)
Glucose, Bld: 138 mg/dL — ABNORMAL HIGH (ref 70–99)
Potassium: 5.6 mmol/L — ABNORMAL HIGH (ref 3.5–5.1)
Sodium: 134 mmol/L — ABNORMAL LOW (ref 135–145)
Total Bilirubin: 0.5 mg/dL (ref 0.0–1.2)
Total Protein: 6.4 g/dL — ABNORMAL LOW (ref 6.5–8.1)

## 2024-08-25 LAB — I-STAT VENOUS BLOOD GAS, ED
Acid-Base Excess: 1 mmol/L (ref 0.0–2.0)
Bicarbonate: 25.7 mmol/L (ref 20.0–28.0)
Calcium, Ion: 0.89 mmol/L — CL (ref 1.15–1.40)
HCT: 37 % — ABNORMAL LOW (ref 39.0–52.0)
Hemoglobin: 12.6 g/dL — ABNORMAL LOW (ref 13.0–17.0)
O2 Saturation: 78 %
Potassium: 5.1 mmol/L (ref 3.5–5.1)
Sodium: 135 mmol/L (ref 135–145)
TCO2: 27 mmol/L (ref 22–32)
pCO2, Ven: 38.8 mmHg — ABNORMAL LOW (ref 44–60)
pH, Ven: 7.428 (ref 7.25–7.43)
pO2, Ven: 41 mmHg (ref 32–45)

## 2024-08-25 LAB — CBC WITH DIFFERENTIAL/PLATELET
Abs Immature Granulocytes: 0.03 K/uL (ref 0.00–0.07)
Basophils Absolute: 0.1 K/uL (ref 0.0–0.1)
Basophils Relative: 1 %
Eosinophils Absolute: 0.2 K/uL (ref 0.0–0.5)
Eosinophils Relative: 3 %
HCT: 38 % — ABNORMAL LOW (ref 39.0–52.0)
Hemoglobin: 11.5 g/dL — ABNORMAL LOW (ref 13.0–17.0)
Immature Granulocytes: 1 %
Lymphocytes Relative: 24 %
Lymphs Abs: 1.5 K/uL (ref 0.7–4.0)
MCH: 27.2 pg (ref 26.0–34.0)
MCHC: 30.3 g/dL (ref 30.0–36.0)
MCV: 89.8 fL (ref 80.0–100.0)
Monocytes Absolute: 0.7 K/uL (ref 0.1–1.0)
Monocytes Relative: 12 %
Neutro Abs: 3.8 K/uL (ref 1.7–7.7)
Neutrophils Relative %: 59 %
Platelets: 116 K/uL — ABNORMAL LOW (ref 150–400)
RBC: 4.23 MIL/uL (ref 4.22–5.81)
RDW: 17.1 % — ABNORMAL HIGH (ref 11.5–15.5)
WBC: 6.3 K/uL (ref 4.0–10.5)
nRBC: 0 % (ref 0.0–0.2)

## 2024-08-25 LAB — BASIC METABOLIC PANEL WITH GFR
Anion gap: 16 — ABNORMAL HIGH (ref 5–15)
BUN: 53 mg/dL — ABNORMAL HIGH (ref 8–23)
CO2: 23 mmol/L (ref 22–32)
Calcium: 7.9 mg/dL — ABNORMAL LOW (ref 8.9–10.3)
Chloride: 98 mmol/L (ref 98–111)
Creatinine, Ser: 6.63 mg/dL — ABNORMAL HIGH (ref 0.61–1.24)
GFR, Estimated: 8 mL/min — ABNORMAL LOW (ref >60)
Glucose, Bld: 125 mg/dL — ABNORMAL HIGH (ref 70–99)
Potassium: 5.4 mmol/L — ABNORMAL HIGH (ref 3.5–5.1)
Sodium: 137 mmol/L (ref 135–145)

## 2024-08-25 LAB — I-STAT CHEM 8, ED
BUN: 60 mg/dL — ABNORMAL HIGH (ref 8–23)
Calcium, Ion: 0.9 mmol/L — ABNORMAL LOW (ref 1.15–1.40)
Chloride: 100 mmol/L (ref 98–111)
Creatinine, Ser: 7.3 mg/dL — ABNORMAL HIGH (ref 0.61–1.24)
Glucose, Bld: 121 mg/dL — ABNORMAL HIGH (ref 70–99)
HCT: 38 % — ABNORMAL LOW (ref 39.0–52.0)
Hemoglobin: 12.9 g/dL — ABNORMAL LOW (ref 13.0–17.0)
Potassium: 5.2 mmol/L — ABNORMAL HIGH (ref 3.5–5.1)
Sodium: 136 mmol/L (ref 135–145)
TCO2: 23 mmol/L (ref 22–32)

## 2024-08-25 LAB — URINALYSIS, ROUTINE W REFLEX MICROSCOPIC
Bilirubin Urine: NEGATIVE
Glucose, UA: 100 mg/dL — AB
Ketones, ur: NEGATIVE mg/dL
Nitrite: NEGATIVE
Protein, ur: >300 mg/dL — AB
Specific Gravity, Urine: 1.02 (ref 1.005–1.030)
pH: 6.5 (ref 5.0–8.0)

## 2024-08-25 LAB — URINALYSIS, MICROSCOPIC (REFLEX)
RBC / HPF: >50 RBC/hpf (ref 0–5)
WBC, UA: >50 WBC/hpf (ref 0–5)

## 2024-08-25 LAB — TROPONIN T, HIGH SENSITIVITY
Troponin T High Sensitivity: 179 ng/L (ref 0–19)
Troponin T High Sensitivity: 183 ng/L (ref 0–19)

## 2024-08-25 LAB — GLUCOSE, CAPILLARY
Glucose-Capillary: 143 mg/dL — ABNORMAL HIGH (ref 70–99)
Glucose-Capillary: 150 mg/dL — ABNORMAL HIGH (ref 70–99)

## 2024-08-25 LAB — CG4 I-STAT (LACTIC ACID): Lactic Acid, Venous: 1.5 mmol/L (ref 0.5–1.9)

## 2024-08-25 LAB — PROTIME-INR
INR: 1.3 — ABNORMAL HIGH (ref 0.8–1.2)
Prothrombin Time: 16.9 s — ABNORMAL HIGH (ref 11.4–15.2)

## 2024-08-25 LAB — PRO BRAIN NATRIURETIC PEPTIDE: Pro Brain Natriuretic Peptide: >35000 pg/mL — ABNORMAL HIGH (ref <300.0)

## 2024-08-25 LAB — APTT: aPTT: 31 s (ref 24–36)

## 2024-08-25 LAB — HEPARIN LEVEL (UNFRACTIONATED): Heparin Unfractionated: <0.1 [IU]/mL — ABNORMAL LOW (ref 0.30–0.70)

## 2024-08-25 LAB — PHOSPHORUS: Phosphorus: 7.1 mg/dL — ABNORMAL HIGH (ref 2.5–4.6)

## 2024-08-25 LAB — CBG MONITORING, ED
Glucose-Capillary: 121 mg/dL — ABNORMAL HIGH (ref 70–99)
Glucose-Capillary: 121 mg/dL — ABNORMAL HIGH (ref 70–99)

## 2024-08-25 LAB — MAGNESIUM: Magnesium: 1.9 mg/dL (ref 1.7–2.4)

## 2024-08-25 MED ORDER — INSULIN ASPART 100 UNIT/ML IJ SOLN
0.0000 [IU] | INTRAMUSCULAR | Status: DC
  Administered 2024-08-27 – 2024-08-28 (×4): 1 [IU] via SUBCUTANEOUS
  Administered 2024-08-29: 2 [IU] via SUBCUTANEOUS
  Administered 2024-08-30 – 2024-08-31 (×6): 1 [IU] via SUBCUTANEOUS
  Filled 2024-08-25 (×3): qty 1
  Filled 2024-08-25: qty 4
  Filled 2024-08-25: qty 1
  Filled 2024-08-25: qty 2
  Filled 2024-08-25 (×4): qty 1

## 2024-08-25 MED ORDER — VANCOMYCIN HCL 2000 MG/400ML IV SOLN
2000.0000 mg | Freq: Once | INTRAVENOUS | Status: AC
  Administered 2024-08-25: 23:00:00 2000 mg via INTRAVENOUS
  Filled 2024-08-25: qty 400

## 2024-08-25 MED ORDER — PANTOPRAZOLE SODIUM 40 MG IV SOLR
40.0000 mg | Freq: Every day | INTRAVENOUS | Status: DC
  Administered 2024-08-25 – 2024-09-01 (×8): 40 mg via INTRAVENOUS
  Filled 2024-08-25 (×8): qty 10

## 2024-08-25 MED ORDER — DILTIAZEM HCL-DEXTROSE 125-5 MG/125ML-% IV SOLN (PREMIX)
5.0000 mg/h | INTRAVENOUS | Status: DC
  Administered 2024-08-25: 16:00:00 5 mg/h via INTRAVENOUS
  Filled 2024-08-25: qty 125

## 2024-08-25 MED ORDER — SODIUM CHLORIDE 0.9 % IV SOLN
1.0000 g | INTRAVENOUS | Status: DC
  Administered 2024-08-25 – 2024-08-27 (×3): 1 g via INTRAVENOUS
  Filled 2024-08-25 (×5): qty 10

## 2024-08-25 MED ORDER — ETOMIDATE 2 MG/ML IV SOLN
INTRAVENOUS | Status: AC | PRN
  Administered 2024-08-25: 19:00:00 20 mg via INTRAVENOUS

## 2024-08-25 MED ORDER — NALOXONE HCL 0.4 MG/ML IJ SOLN
0.4000 mg | Freq: Once | INTRAMUSCULAR | Status: AC
  Administered 2024-08-25: 18:00:00 0.4 mg via INTRAVENOUS

## 2024-08-25 MED ORDER — HEPARIN (PORCINE) 25000 UT/250ML-% IV SOLN
2700.0000 [IU]/h | INTRAVENOUS | Status: AC
  Administered 2024-08-25: 1200 [IU]/h via INTRAVENOUS
  Administered 2024-08-26: 20:00:00 1400 [IU]/h via INTRAVENOUS
  Administered 2024-08-27: 21:00:00 2000 [IU]/h via INTRAVENOUS
  Administered 2024-08-28: 2500 [IU]/h via INTRAVENOUS
  Administered 2024-08-29: 2700 [IU]/h via INTRAVENOUS
  Filled 2024-08-25 (×7): qty 250

## 2024-08-25 MED ORDER — SODIUM CHLORIDE 0.9 % IV SOLN: 2.0000 g | Freq: Once | INTRAVENOUS | Status: DC

## 2024-08-25 MED ORDER — CALCIUM GLUCONATE 10 % IV SOLN: 1.0000 g | Freq: Once | INTRAVENOUS | Status: DC

## 2024-08-25 MED ORDER — NALOXONE HCL 0.4 MG/ML IJ SOLN
0.4000 mg | Freq: Once | INTRAMUSCULAR | Status: AC
  Administered 2024-08-25: 19:00:00 0.4 mg via INTRAVENOUS

## 2024-08-25 MED ORDER — CHLORHEXIDINE GLUCONATE CLOTH 2 % EX PADS
6.0000 | MEDICATED_PAD | Freq: Every day | CUTANEOUS | Status: DC
  Administered 2024-08-26 – 2024-08-27 (×3): 6 via TOPICAL

## 2024-08-25 MED ORDER — LORAZEPAM 2 MG/ML IJ SOLN
1.0000 mg | Freq: Once | INTRAMUSCULAR | Status: AC
  Administered 2024-08-25: 14:00:00 1 mg via INTRAVENOUS
  Filled 2024-08-25: qty 1

## 2024-08-25 MED ORDER — ORAL CARE MOUTH RINSE: 15.0000 mL | OROMUCOSAL | Status: DC | PRN

## 2024-08-25 MED ORDER — DILTIAZEM LOAD VIA INFUSION
20.0000 mg | Freq: Once | INTRAVENOUS | Status: AC
  Administered 2024-08-25: 16:00:00 20 mg via INTRAVENOUS
  Filled 2024-08-25: qty 20

## 2024-08-25 MED ORDER — FENTANYL CITRATE (PF) 50 MCG/ML IJ SOSY
25.0000 ug | PREFILLED_SYRINGE | Freq: Once | INTRAMUSCULAR | Status: AC
  Administered 2024-08-26: 20:00:00 50 ug via INTRAVENOUS
  Filled 2024-08-25: qty 1

## 2024-08-25 MED ORDER — NOREPINEPHRINE 4 MG/250ML-% IV SOLN
0.0000 ug/min | INTRAVENOUS | Status: DC
  Administered 2024-08-25 – 2024-08-27 (×2): 2 ug/min via INTRAVENOUS
  Filled 2024-08-25: qty 250

## 2024-08-25 MED ORDER — CALCIUM CHLORIDE 10 % IV SOLN: 1.0000 g | Freq: Once | INTRAVENOUS | Status: DC

## 2024-08-25 MED ORDER — ORAL CARE MOUTH RINSE
15.0000 mL | OROMUCOSAL | Status: DC
  Administered 2024-08-25 – 2024-08-28 (×32): 15 mL via OROMUCOSAL

## 2024-08-25 MED ORDER — NALOXONE HCL 2 MG/2ML IJ SOSY
PREFILLED_SYRINGE | INTRAMUSCULAR | Status: AC
  Filled 2024-08-25: qty 2

## 2024-08-25 MED ORDER — MORPHINE SULFATE (PF) 4 MG/ML IV SOLN
4.0000 mg | Freq: Once | INTRAVENOUS | Status: AC
  Administered 2024-08-25: 14:00:00 4 mg via INTRAVENOUS
  Filled 2024-08-25: qty 1

## 2024-08-25 MED ORDER — VANCOMYCIN VARIABLE DOSE PER UNSTABLE RENAL FUNCTION (PHARMACIST DOSING): Status: DC

## 2024-08-25 MED ORDER — CHLORHEXIDINE GLUCONATE CLOTH 2 % EX PADS
6.0000 | MEDICATED_PAD | Freq: Every day | CUTANEOUS | Status: DC
  Administered 2024-08-25 – 2024-09-02 (×8): 6 via TOPICAL

## 2024-08-25 MED ORDER — POLYETHYLENE GLYCOL 3350 17 G PO PACK: 17.0000 g | PACK | Freq: Every day | ORAL | Status: DC | PRN

## 2024-08-25 MED ORDER — VANCOMYCIN HCL IN DEXTROSE 1-5 GM/200ML-% IV SOLN: 1000.0000 mg | Freq: Once | INTRAVENOUS | Status: DC

## 2024-08-25 MED ORDER — ROCURONIUM BROMIDE 10 MG/ML (PF) SYRINGE
PREFILLED_SYRINGE | INTRAVENOUS | Status: AC | PRN
  Administered 2024-08-25: 19:00:00 100 mg via INTRAVENOUS

## 2024-08-25 MED ORDER — VANCOMYCIN HCL 2000 MG/400ML IV SOLN
2000.0000 mg | Freq: Once | INTRAVENOUS | Status: DC
  Filled 2024-08-25: qty 400

## 2024-08-25 MED ORDER — FENTANYL 2500MCG IN NS 250ML (10MCG/ML) PREMIX INFUSION: 0.0000 ug/h | INTRAVENOUS | Status: DC

## 2024-08-25 MED ORDER — FENTANYL BOLUS VIA INFUSION: 25.0000 ug | INTRAVENOUS | Status: DC | PRN

## 2024-08-25 MED ORDER — SODIUM ZIRCONIUM CYCLOSILICATE 10 G PO PACK
10.0000 g | PACK | Freq: Once | ORAL | Status: AC
  Administered 2024-08-25: 23:00:00 10 g
  Filled 2024-08-25: qty 1

## 2024-08-25 MED ORDER — DOXERCALCIFEROL 4 MCG/2ML IV SOLN
2.0000 ug | INTRAVENOUS | Status: DC
  Filled 2024-08-25: qty 2

## 2024-08-25 MED ORDER — PROPOFOL 1000 MG/100ML IV EMUL
0.0000 ug/kg/min | INTRAVENOUS | Status: DC
  Administered 2024-08-25 (×2): 30 ug/kg/min via INTRAVENOUS
  Administered 2024-08-26 (×2): 10 ug/kg/min via INTRAVENOUS
  Administered 2024-08-27: 02:00:00 30 ug/kg/min via INTRAVENOUS
  Filled 2024-08-25 (×5): qty 100

## 2024-08-25 NOTE — Progress Notes (Signed)
 Transported patient from emergency room to C.T and then to 4 north room 19 while patient was on the mechanical ventilator. Patient remained stable during transport.

## 2024-08-25 NOTE — Consult Note (Signed)
 [facility] Renal Consultation Note  Requesting MD: Harlene Na, DO Indication for Consultation:  ESRD  Chief complaint: unresponsive during HD  HPI:  [name] is a 68 y.o. male with a history of CAD, PAD, type 2 DM, HTN, and end-stage renal disease on hemodialysis TTS who presented to the hospital after he had an unresponsive episode on dialysis earlier today.  He was intubated for airway protection.  He is being admitted to the ICU.  Nephrology is consulted for assistance with management of end-stage renal disease.  Per report, the patient received about an hour of dialysis today.  Per dialysis charting, he was acutely unresponsive and EMS was called for transport to [facility].  Post weight on 12/13 was 114.6 kg and his pre-HD weight on 12/16 was 120.1 kg. I spoke with his wife at bedside who provided additional history.  She states that he was discovered today to have on two fentanyl  patches.  She states these were removed in the ER.  He has arrived to the ICU and his nurse has just completed her skin assessment and confirms that he has no remaining patches on.  He received narcan  for overdose.  He is currently at a SNF and his wife states that she is concerned that the fentanyl  patches are not dated at his facility.  She states that he has been going to HD recently.  He has previously reported to her that sometimes if/when he does not go that it is due to not being able to have access to a restroom to have a bowel movement while there.  He is uncomfortable in a brief and has had issues with leakage in the past and he is worried that other patients are able to smell an odor as well; that has been a struggle.  His wife states that he has had a hard time getting over his recent pneumonia.    PMHx:   Past Medical History:  Diagnosis Date   Carotid artery occlusion 11/10/2010   LEFT CAROTID ENDARTERECTOMY   Complication of anesthesia    BP WENT UP AT [facility]    COPD (chronic  obstructive pulmonary disease) (HCC)    pt denies this dx as of 06/01/20 - no inhaler    Diabetes mellitus without complication (HCC)    Diverticulitis    Diverticulosis of colon (without mention of hemorrhage)    DJD (degenerative joint disease)    knees/hands/feet/back/neck   ESRD (end stage renal disease) on dialysis (HCC)    Fatty liver    Full dentures    GERD (gastroesophageal reflux disease)    H/O hiatal hernia    History of blood transfusion    with a past surical procedure per patient 06/01/20   History of right below knee amputation (HCC) 07/12/2016   Hyperlipidemia    Hypertension    Neuromuscular disorder (HCC)    peripheral neuropathy   Non-pressure chronic ulcer of other part of left foot limited to breakdown of skin (HCC) 11/12/2016   Nonrheumatic aortic (valve) stenosis 03/18/2024   Limited TTE 03/18/2024: EF 55-60, moderate LVH, normal RVSF, moderate LAE, mild MR, mild-moderate AS (mean 12, V-max 235 cm/s, DI 0.35), aortic root 39 mm, moderate pleural effusion, trivial pericardial effusion    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 Atherectomy left popliteal artery July 2012.    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   Unstable angina (HCC) 09/16/2018   Wears glasses     Past Surgical History:  Procedure Laterality Date   ABDOMINAL AORTOGRAM W/LOWER EXTREMITY N/A 06/23/2021   Procedure: ABDOMINAL AORTOGRAM W/LOWER EXTREMITY;  Surgeon: Elmira Newman PARAS, MD;  Location: [facility] INVASIVE [facility];  Service: Cardiovascular;  Laterality: N/A;   AMPUTATION  11/05/2011   Procedure: AMPUTATION RAY;  Surgeon: Norleen Armor, MD;  Location: [facility] OR;  Service: Orthopedics;  Laterality:  Right;  Amputation of Right 4&5th Toes   AMPUTATION Left 11/26/2012   Procedure: AMPUTATION RAY;  Surgeon: Norleen Armor, MD;  Location: [facility] OR;  Service: Orthopedics;  Laterality: Left;  fourth ray amputation   AMPUTATION Right 08/27/2014   Procedure: Transmetatarsal Amputation;  Surgeon: Jerona Harden GAILS, MD;  Location: [facility] OR;  Service: Orthopedics;  Laterality: Right;   AMPUTATION Right 01/14/2015   Procedure: AMPUTATION BELOW KNEE;  Surgeon: Jerona Harden GAILS, MD;  Location: [facility] OR;  Service: Orthopedics;  Laterality: Right;   AMPUTATION Left 10/21/2015   Procedure: Left Foot 5th Ray Amputation;  Surgeon: Jerona Harden GAILS, MD;  Location: [facility] OR;  Service: Orthopedics;  Laterality: Left;   AMPUTATION Left 07/04/2022   Procedure: LEFT BELOW KNEE AMPUTATION;  Surgeon: Harden Jerona GAILS, MD;  Location: [facility] OR;  Service: Orthopedics;  Laterality: Left;   ANTERIOR FUSION CERVICAL SPINE  02/06/06   C4-5, C5-6, C6-7; SURGEON DR. MAX COHEN   AV FISTULA PLACEMENT Left 06/02/2020   Procedure: ARTERIOVENOUS (AV) FISTULA CREATION LEFT;  Surgeon: Sheree Penne Bruckner, MD;  Location: [facility] OR;  Service: Vascular;  Laterality: Left;   BACK SURGERY     x 3   BASCILIC VEIN TRANSPOSITION Left 07/21/2020   Procedure: LEFT UPPER ARM ATERIOVENOUS SUPERFISTULALIZATION;  Surgeon: Sheree Penne Bruckner, MD;  Location: [facility] OR;  Service: Vascular;  Laterality: Left;   BELOW KNEE LEG AMPUTATION Right    CARDIAC CATHETERIZATION  10/31/04   2009   CAROTID ENDARTERECTOMY  11/10/10   CAROTID ENDARTERECTOMY Left 11/10/2010   Subtotal occlusion of left internal carotid artery with left hemispheric transient ischemic attacks.   CAROTID STENT     CARPAL TUNNEL RELEASE Right 10/21/2013   Procedure: RIGHT CARPAL TUNNEL RELEASE;  Surgeon: Arley JONELLE Curia, MD;  Location: [facility];  Service: Orthopedics;  Laterality: Right;   CHOLECYSTECTOMY     COLON SURGERY     COLONOSCOPY     COLOSTOMY REVERSAL  05/21/2018   ileostomy reversal    CORONARY LITHOTRIPSY N/A 11/24/2023   Procedure: CORONARY LITHOTRIPSY;  Surgeon: Jordan, Peter M, MD;  Location: [facility] INVASIVE CV LAB;  Service: Cardiovascular;  Laterality: N/A;   CORONARY STENT INTERVENTION N/A 11/24/2023   Procedure: CORONARY STENT INTERVENTION;  Surgeon: Jordan, Peter M, MD;  Location: [facility] INVASIVE CV LAB;  Service: Cardiovascular;  Laterality: N/A;   CYSTOSCOPY WITH STENT PLACEMENT Bilateral 01/13/2018   Procedure: CYSTOSCOPY WITH BILATERAL URETERAL CATHETER PLACEMENT;  Surgeon: Cam Morene ORN, MD;  Location: [facility] ORS;  Service: Urology;  Laterality: Bilateral;   ESOPHAGEAL MANOMETRY Bilateral 07/19/2014   Procedure: ESOPHAGEAL MANOMETRY (EM);  Surgeon: Gordy CHRISTELLA Starch, MD;  Location: [facility] ENDOSCOPY;  Service: Gastroenterology;  Laterality: Bilateral;   EYE SURGERY Bilateral 2020   cataract   FEMORAL ARTERY STENT  x6   FINGER SURGERY     FOOT SURGERY  04/25/2016    EXCISION BASE 5TH METATARSAL AND PARTIAL CUBOID LEFT FOOT   HERNIA REPAIR     LEFT INGUINAL AND UMBILICAL REPAIRS   HERNIA REPAIR     I & D EXTREMITY Left 04/25/2016   Procedure: EXCISION BASE 5TH METATARSAL AND PARTIAL CUBOID LEFT FOOT;  Surgeon: Jerona LULLA Sage, MD;  Location: [facility] OR;  Service: Orthopedics;  Laterality: Left;   ILEOSTOMY  01/13/2018   Procedure: ILEOSTOMY;  Surgeon: Signe Mitzie LABOR, MD;  Location: [facility] ORS;  Service: General;;   ILEOSTOMY CLOSURE N/A 05/21/2018   Procedure: ILEOSTOMY REVERSAL ERAS PATHWAY;  Surgeon: Signe Mitzie LABOR, MD;  Location: [facility] OR;  Service: General;  Laterality: N/A;   IR RADIOLOGIST EVAL & MGMT  11/19/2017   IR RADIOLOGIST EVAL & MGMT  12/03/2017   IR RADIOLOGIST EVAL & MGMT  12/18/2017   JOINT REPLACEMENT Right 2001   Total knee   LAMINECTOMY     X 3 LUMBAR AND X 2 CERVICAL SPINE OPERATIONS   LAPAROSCOPIC CHOLECYSTECTOMY W/ CHOLANGIOGRAPHY  11/09/04   SURGEON DR. DEWARD CANDIE NULL   LEFT HEART CATH AND CORONARY ANGIOGRAPHY N/A 09/16/2018   Procedure: LEFT HEART CATH AND CORONARY  ANGIOGRAPHY;  Surgeon: Elmira Newman PARAS, MD;  [facility];  Service: Cardiovascular;  Laterality: N/A;   LEFT HEART CATH AND CORONARY ANGIOGRAPHY N/A 11/24/2023   Procedure: LEFT HEART CATH AND CORONARY ANGIOGRAPHY;  Surgeon: Jordan, Peter M, MD;  Location: [facility] INVASIVE CV LAB;  Service: Cardiovascular;  Laterality: N/A;   LEFT HEART CATHETERIZATION WITH CORONARY ANGIOGRAM N/A 10/29/2014   Procedure: LEFT HEART CATHETERIZATION WITH CORONARY ANGIOGRAM;  Surgeon: Erick JONELLE Bergamo, MD;  Location: [facility] CATH LAB;  Service: Cardiovascular;  Laterality: N/A;   LIGATION OF COMPETING BRANCHES OF ARTERIOVENOUS FISTULA Left 07/21/2020   Procedure: LIGATION OF COMPETING BRANCHES OF LEFT UPPER ARM ARTERIOVENOUS FISTULA;  Surgeon: Sheree Penne Bruckner, MD;  Location: [facility] OR;  Service: Vascular;  Laterality: Left;   LOWER EXTREMITY ANGIOGRAM N/A 03/19/2012   Procedure: LOWER EXTREMITY ANGIOGRAM;  Surgeon: Bruckner JONETTA Cash, MD;  Location: [facility] CATH LAB;  Service: Cardiovascular;  Laterality: N/A;   LOWER EXTREMITY ANGIOGRAPHY N/A 06/20/2021   Procedure: LOWER EXTREMITY ANGIOGRAPHY;  Surgeon: Elmira Newman PARAS, MD;  [facility];  Service: Cardiovascular;  Laterality: N/A;   NECK SURGERY     PARTIAL COLECTOMY N/A 01/13/2018   Procedure: LAPAROSCOPIC ASSISTED   SIGMOID COLECTOMY ILEOSTOMY;  Surgeon: Signe Mitzie LABOR, MD;  Location: [facility] ORS;  Service: General;  Laterality: N/A;   PENILE PROSTHESIS IMPLANT  08/14/05   INFRAPUBIC INSERTION OF INFLATABLE PENILE PROSTHESIS; SURGEON DR. JANIT   PENILE PROSTHESIS IMPLANT     PERCUTANEOUS CORONARY STENT INTERVENTION (PCI-S) Right 10/29/2014   Procedure: PERCUTANEOUS CORONARY STENT INTERVENTION (PCI-S);  Surgeon: Erick JONELLE Bergamo, MD;  Location: [facility] CATH LAB;  Service: Cardiovascular;  Laterality: Right;   PERICARDIOCENTESIS N/A 02/13/2024   Procedure: PERICARDIOCENTESIS;  Surgeon: Elmira Newman PARAS, MD;  [facility] CV  LAB;  Service: Cardiovascular;  Laterality: N/A;   PERIPHERAL VASCULAR INTERVENTION Left 06/23/2021   Procedure: PERIPHERAL VASCULAR INTERVENTION;  Surgeon: Elmira Newman PARAS, MD;  [facility]: [facility];  Service: Cardiovascular;  Laterality: Left;   REMOVAL OF PENILE PROSTHESIS N/A 06/14/2021   Procedure: Removal of THREE piece inflatable penile prosthesis;  Surgeon: Carolee Sherwood JONETTA DOUGLAS, MD;  Location: [facility] OR;  Service: Urology;  Laterality: N/A;   SHOULDER ARTHROSCOPY     SPINE SURGERY     TEMPORARY PACEMAKER N/A 02/13/2024   Procedure: TEMPORARY PACEMAKER;  Surgeon: Elmira Newman PARAS, MD;  Location: [facility];  Service: Cardiovascular;  Laterality: N/A;   TOE AMPUTATION Left    TONSILLECTOMY     TOTAL KNEE ARTHROPLASTY  07/2002   RIGHT KNEE ; SURGEON  DR. [name] ALSO HAD ARTHROSCOPIC RIGHT KNEE IN  10/2001   TOTAL KNEE ARTHROPLASTY     ULNAR NERVE TRANSPOSITION Right 10/21/2013   Procedure: RIGHT ELBOW  ULNAR NERVE DECOMPRESSION;  Surgeon: Arley JONELLE Curia, MD;  Location: [facility];  Service: Orthopedics;  Laterality: Right;    Family Hx:  Family History  Problem Relation Age of Onset   Heart disease Father        Before age 36-  CAD, BPG   Diabetes Father        Amputation   Cancer Father        PROSTATE   Hyperlipidemia Father    Hypertension Father    Heart attack Father        Triple BPG   Varicose Veins Father    Cancer Sister        Breast   Hyperlipidemia Sister    Hypertension Sister    Heart attack Brother    Colon cancer Brother    Diabetes Brother    Heart disease Brother 48       A-Fib. Before age 44   Hyperlipidemia Brother    Hypertension Brother    Hypertension Son    Arthritis Other        GRANDMOTHER   Hypertension Other        OTHER FAMILY MEMBERS    Social History:  reports that he has been smoking cigarettes. He has a 35 pack-year smoking history. He has never used smokeless tobacco. He reports that he does not  currently use alcohol . He reports that he does not use drugs.  Allergies: Allergies[1]  Medications: Prior to Admission medications  Medication Sig Start Date End Date Taking? Authorizing Provider  amLODipine  (NORVASC ) 10 MG tablet Take 5 mg by mouth daily.    [provider]  apixaban  (ELIQUIS ) 5 MG TABS tablet Take 1 tablet (5 mg total) by mouth 2 (two) times daily. 01/14/22 09/09/24  Gonfa, Taye T, MD  atorvastatin  (LIPITOR ) 80 MG tablet Take 1 tablet (80 mg total) by mouth at bedtime. 11/25/23   Amoako, Prince, MD  B Complex-C-Zn-Folic Acid  (DIALYVITE /ZINC ) TABS Take 1 tablet by mouth See admin instructions. Give 1 tablet by mouth 3 days a week prior to dialysis on Tuesday, Thursday, Saturday. 10/04/21   [provider]  clopidogrel  (PLAVIX ) 75 MG tablet Take 1 tablet (75 mg total) by mouth daily with breakfast. Patient taking differently: Take 75 mg by mouth daily. 11/25/23 11/24/24  Amoako, Prince, MD  colchicine  0.6 MG tablet Take 0.5 tablets (0.3 mg total) by mouth daily. 02/21/24   Gonfa, Taye T, MD  famotidine  (PEPCID ) 20 MG tablet Take 20 mg by mouth See admin instructions. Give 1 tablet (20mg ) by mouth every other morning.    [provider]  fentaNYL  (DURAGESIC ) 50 MCG/HR Place 1 patch onto the skin every 3 (three) days. 08/12/24   Raenelle Coria, MD  folic acid  (FOLVITE ) 1 MG tablet Take 1 mg by mouth daily. 09/18/21   [provider]  gabapentin  (NEURONTIN ) 100 MG capsule Take 100 mg by mouth 3 (  three) times daily.    [provider]  insulin  aspart (NOVOLOG  FLEXPEN) 100 UNIT/ML FlexPen Inject 4-16 Units into the skin See admin instructions. Inject 4 units subcutaneously three times daily plus 0-12 units per sliding scale: 70 - 150 : 0 units 151-200 : 2 units 201-250 : 4 units 251-300 : 6 units 301-350 : 8 units 351-400 : 10 units 401-450 : 12 units > 400 : give 12 units and recheck in 1 hour - if still > 400, call MD/NP for further orders.     [provider]  ipratropium-albuterol  (DUONEB) 0.5-2.5 (3) MG/3ML SOLN  05/26/24   [provider]  isosorbide  mononitrate (IMDUR ) 60 MG 24 hr tablet Take 60 mg by mouth daily. Give 60mg  by mouth one time a day    [provider]  lidocaine -prilocaine  (EMLA ) cream Apply 1 application  topically See admin instructions. Apply topically 3 days a week prior to dialysis on Tuesday, Thursday, Saturday. 06/06/21   [provider]  loratadine  (CLARITIN ) 10 MG tablet Take 10 mg by mouth daily.    [provider]  mineral oil-hydrophilic petrolatum (AQUAPHOR) ointment Apply 1 Application topically every 8 (eight) hours as needed for irritation or dry skin.    [provider]  nitroGLYCERIN  (NITROSTAT ) 0.4 MG SL tablet Place 0.4 mg under the tongue every 5 (five) minutes as needed for chest pain. 01/06/22   [provider]  oxyCODONE -acetaminophen  (PERCOCET ) 7.5-325 MG tablet Take 1 tablet by mouth every 8 (eight) hours as needed for severe pain (pain score 7-10). 08/12/24   Raenelle Coria, MD  promethazine  (PHENERGAN ) 12.5 MG suppository Place 12.5 mg rectally daily as needed for nausea or vomiting.    [provider]  rOPINIRole  (REQUIP ) 2 MG tablet Take 2 mg by mouth at bedtime.    [provider]  sevelamer  carbonate (RENVELA ) 800 MG tablet Take 2 tablets (1,600 mg total) by mouth 3 (three) times daily with meals. 02/04/24   Caleen Qualia, MD  tamsulosin  (FLOMAX ) 0.4 MG CAPS capsule Take 0.4 mg by mouth daily.    [provider]    I have reviewed the patient's current and reported prior to admission medications.   Labs:     Latest Ref Rng & Units 08/25/2024    3:38 PM 08/25/2024    2:21 PM 08/25/2024    2:15 PM  BMP  Glucose 70 - 99 mg/dL 878   874   BUN 8 - 23 mg/dL 60   53   Creatinine 9.38 - 1.24 mg/dL 2.69   3.36   Sodium 864 - 145 mmol/L 136  135  137   Potassium 3.5 - 5.1 mmol/L 5.2  5.1  5.4    Chloride 98 - 111 mmol/L 100   98   CO2 22 - 32 mmol/L   23   Calcium  8.9 - 10.3 mg/dL   7.9      ROS:  Unable to obtain secondary to intubated and sedated   Physical Exam: Vitals:   08/25/24 2015 08/25/24 2020  BP: 138/82 (!) 149/74  Pulse: 83 93  Resp: (!) 21 20  Temp: 99.1 F (37.3 C) 99.1 F (37.3 C)  SpO2: 100% 100%     General:  elderly male in bed intubated  HEENT: NCAT Neck: supple trachea midline  Heart: S1S2 no rub Lungs: coarse mechanical breath sounds; synchronous with vent. FIO2 40 and PEEP 5 Abdomen: softly distended/nt with limits of sedation; obese habitus Extremities: bilateral BKA; 2-3+ bilateral  lower extremity edema; no clubbing or cyanosis  Skin: no rash on extremities exposed  Neuro: GU: foley in place with minimal urine output   Outpatient HD orders:  [facility]  TTS  4 hours and 15 minutes BF 450 DF 800  EDW - 108 kg 2K/2.5 Ca AVF  UF max is 4K per orders Profile 2 Meds: hectorol  2 mcg three times a week Mircera 150 mcg given on 08/01/24.  Venofer  100 mg given on 12/6, 12/4, 12/2 - order no longer active (for venofer )  Assessment/Plan:  # ESRD  - HD per TTS schedule usually - Plan for HD tomorrow off schedule then assess needs daily.  Would consider another treatment on Thursday per his regular schedule given that he's intubated and so far from his dry weight  - Can discontinue foley in AM from renal standpoint   # Unresponsive - intubated for airway protection   - on vent per critical care  - found to have two fentanyl  patches on and these have been removed - s/p narcan    # Hypotension   - Hx of HTN.  Holding any anti-hypertensives and has been on norepi s/p intubation.  Requirement improving  - norepinephrine  per primary team   # Hyperkalemia - lokelma  once now  - HD tomorrow   # Anemia of CKD  - Mild - defer ESA for now and follow trends - CBC in AM as ordered   # Metabolic bone disease  - on  hectorol  2 mcg three times a week - resume   Thank you for the consult.  Please do not hesitate to contact me with any questions regarding our patient    [name] 08/25/2024, 9:45 PM          [1]  Allergies Allergen Reactions   Contrast Media [Iodinated Contrast Media] Shortness Of Breath and Other (See Comments)    Altered mental status     Ivp Dye [Iodinated Contrast Media] Anaphylaxis, Shortness Of Breath and Other (See Comments)    Altered mental status     Latex Rash    A severe rash appears after the first 24 hours of being placed   Tape Rash and Other (See Comments)    Rash after 1 day of use  Do not leave on longer then 3 days without changing

## 2024-08-25 NOTE — Progress Notes (Signed)
 eLink Physician-Brief Progress Note Patient Name: MADDAX PALINKAS DOB: [birth year] MRN: [id number]   Date of Service  08/25/2024  HPI/Events of Note  Corrected calcium  8.4 gm / dl, Phos 7.0  eICU Interventions  Intravenous calcium  gluconate order discontinued.        Broderick Fonseca U Camia Dipinto 08/25/2024, 10:20 PM

## 2024-08-25 NOTE — ED Provider Notes (Signed)
 [place] EMERGENCY DEPARTMENT AT [facility] Provider Note   CSN: [id number] Arrival date & time: 08/25/24  1353     Patient presents with: No chief complaint on file.   [name] is a 68 y.o. male.   Pt is a 68 yo male with pmhx significant for ESRD on HD (Tu, Th, Sat), HLD, CAD, PAD, GERD, COPD, DM2, s/p R bka, afib on Eliquis .  Pt presents today from dialysis unresponsive.  Apparently, he was normal when dialysis was started.  They checked him on about an hour into dialysis and he was unresponsive.  Per EMS, pt was last dialyzed a week ago.  Pt was admitted from 12/2-12/3 for pna.  Pt is unable to give any hx. He is wearing a fentanyl  patch and has pinpoint pupils.       Prior to Admission medications  Medication Sig Start Date End Date Taking? Authorizing Provider  amLODipine  (NORVASC ) 10 MG tablet Take 5 mg by mouth daily.    [provider]  apixaban  (ELIQUIS ) 5 MG TABS tablet Take 1 tablet (5 mg total) by mouth 2 (two) times daily. 01/14/22 09/09/24  Gonfa, Taye T, MD  atorvastatin  (LIPITOR ) 80 MG tablet Take 1 tablet (80 mg total) by mouth at bedtime. 11/25/23   Amoako, Prince, MD  B Complex-C-Zn-Folic Acid  (DIALYVITE /ZINC ) TABS Take 1 tablet by mouth See admin instructions. Give 1 tablet by mouth 3 days a week prior to dialysis on Tuesday, Thursday, Saturday. 10/04/21   [provider]  clopidogrel  (PLAVIX ) 75 MG tablet Take 1 tablet (75 mg total) by mouth daily with breakfast. Patient taking differently: Take 75 mg by mouth daily. 11/25/23 11/24/24  Amoako, Prince, MD  colchicine  0.6 MG tablet Take 0.5 tablets (0.3 mg total) by mouth daily. 02/21/24   Gonfa, Taye T, MD  famotidine  (PEPCID ) 20 MG tablet Take 20 mg by mouth See admin instructions. Give 1 tablet (20mg ) by mouth every other morning.    [provider]  fentaNYL  (DURAGESIC ) 50 MCG/HR Place 1 patch onto the skin every 3 (three) days. 08/12/24   Raenelle Coria, MD  folic acid   (FOLVITE ) 1 MG tablet Take 1 mg by mouth daily. 09/18/21   [provider]  gabapentin  (NEURONTIN ) 100 MG capsule Take 100 mg by mouth 3 (three) times daily.    [provider]  insulin  aspart (NOVOLOG  FLEXPEN) 100 UNIT/ML FlexPen Inject 4-16 Units into the skin See admin instructions. Inject 4 units subcutaneously three times daily plus 0-12 units per sliding scale: 70 - 150 : 0 units 151-200 : 2 units 201-250 : 4 units 251-300 : 6 units 301-350 : 8 units 351-400 : 10 units 401-450 : 12 units > 400 : give 12 units and recheck in 1 hour - if still > 400, call MD/NP for further orders.    [provider]  ipratropium-albuterol  (DUONEB) 0.5-2.5 (3) MG/3ML SOLN  05/26/24   [provider]  isosorbide  mononitrate (IMDUR ) 60 MG 24 hr tablet Take 60 mg by mouth daily. Give 60mg  by mouth one time a day    [provider]  lidocaine -prilocaine  (EMLA ) cream Apply 1 application  topically See admin instructions. Apply topically 3 days a week prior to dialysis on Tuesday, Thursday, Saturday. 06/06/21   [provider]  loratadine  (CLARITIN ) 10 MG tablet Take 10 mg by mouth daily.    [provider]  mineral oil-hydrophilic petrolatum (AQUAPHOR) ointment Apply 1 Application topically every 8 (eight) hours as needed  for irritation or dry skin.    [provider]  nitroGLYCERIN  (NITROSTAT ) 0.4 MG SL tablet Place 0.4 mg under the tongue every 5 (five) minutes as needed for chest pain. 01/06/22   [provider]  oxyCODONE -acetaminophen  (PERCOCET ) 7.5-325 MG tablet Take 1 tablet by mouth every 8 (eight) hours as needed for severe pain (pain score 7-10). 08/12/24   Raenelle Coria, MD  promethazine  (PHENERGAN ) 12.5 MG suppository Place 12.5 mg rectally daily as needed for nausea or vomiting.    [provider]  rOPINIRole  (REQUIP ) 2 MG tablet Take 2 mg by mouth at bedtime.    [provider]  sevelamer  carbonate (RENVELA )  800 MG tablet Take 2 tablets (1,600 mg total) by mouth 3 (three) times daily with meals. 02/04/24   Amin, Sumayya, MD  tamsulosin  (FLOMAX ) 0.4 MG CAPS capsule Take 0.4 mg by mouth daily.    [provider]    Allergies: Contrast media [iodinated contrast media], Ivp dye [iodinated contrast media], Latex, and Tape    Review of Systems  Unable to perform ROS: Mental status change    Updated Vital Signs BP 120/67   Pulse 77   Resp 16   SpO2 100%   Physical Exam Vitals and nursing note reviewed.  Constitutional:      General: He is in acute distress.     Appearance: He is ill-appearing.  HENT:     Head: Normocephalic and atraumatic.     Right Ear: External ear normal.     Left Ear: External ear normal.     Nose: Nose normal.     Mouth/Throat:     Mouth: Mucous membranes are dry.  Eyes:     Conjunctiva/sclera: Conjunctivae normal.     Comments: Pinpoint pupils  Cardiovascular:     Rate and Rhythm: Tachycardia present. Rhythm irregular.     Pulses: Normal pulses.     Heart sounds: Normal heart sounds.  Pulmonary:     Effort: Pulmonary effort is normal.     Breath sounds: Wheezing and rhonchi present.  Chest:     Comments: Fentanyl  patch (50 mcg) to chest Abdominal:     General: Abdomen is flat. Bowel sounds are normal.     Palpations: Abdomen is soft.  Musculoskeletal:     Cervical back: Normal range of motion and neck supple.  Skin:    General: Skin is warm.     Capillary Refill: Capillary refill takes less than 2 seconds.     (all labs ordered are listed, but only abnormal results are displayed) Labs Reviewed  CBC WITH DIFFERENTIAL/PLATELET - Abnormal; Notable for the following components:      Result Value   Hemoglobin 11.5 (*)    HCT 38.0 (*)    RDW 17.1 (*)    Platelets 116 (*)    All other components within normal limits  I-STAT VENOUS BLOOD GAS, ED - Abnormal; Notable for the following components:   pCO2, Ven 38.8 (*)    Calcium , Ion 0.89 (*)     HCT 37.0 (*)    Hemoglobin 12.6 (*)    All other components within normal limits  CBG MONITORING, ED - Abnormal; Notable for the following components:   Glucose-Capillary 121 (*)    All other components within normal limits  I-STAT CHEM 8, ED - Abnormal; Notable for the following components:   Potassium 5.2 (*)    BUN 60 (*)    Creatinine, Ser 7.30 (*)    Glucose, Bld 121 (*)  Calcium , Ion 0.90 (*)    Hemoglobin 12.9 (*)    HCT 38.0 (*)    All other components within normal limits  RESP PANEL BY RT-PCR (RSV, FLU A&B, COVID)  RVPGX2  BASIC METABOLIC PANEL WITH GFR  PRO BRAIN NATRIURETIC PEPTIDE  TROPONIN T, HIGH SENSITIVITY  TROPONIN T, HIGH SENSITIVITY    EKG: EKG Interpretation Date/Time:  Tuesday August 25 2024 14:33:25 EST Ventricular Rate:  135 PR Interval:    QRS Duration:  172 QT Interval:  313 QTC Calculation: 470 R Axis:   -76  Text Interpretation: Atrial fibrillation RBBB and LAFB Abnormal T, consider ischemia, lateral leads Since last tracing rate faster Confirmed by Dean Clarity [phone]) on 08/25/2024 2:45:43 PM  Radiology: [facility] Chest Port 1 View Result Date: 08/25/2024 CLINICAL DATA:  Shortness of breath. EXAM: PORTABLE CHEST 1 VIEW COMPARISON:  Chest radiograph dated 08/11/2024. FINDINGS: Cardiomegaly with vascular congestion and mild edema. No focal consolidation, pleural effusion, pneumothorax. Atherosclerotic calcification of the aorta. No acute osseous pathology. IMPRESSION: Cardiomegaly with vascular congestion and mild edema. Electronically Signed   By: Vanetta Chou M.D.   On: 08/25/2024 14:55     Procedures   Medications Ordered in the ED  diltiazem  (CARDIZEM ) 1 mg/mL load via infusion 20 mg (20 mg Intravenous Bolus from Bag 08/25/24 1537)    And  diltiazem  (CARDIZEM ) 125 mg in dextrose  5% 125 mL (1 mg/mL) infusion (5 mg/hr Intravenous New Bag/Given 08/25/24 1537)  naloxone  (NARCAN ) 2 MG/2ML injection (  Given 08/25/24 1407)  LORazepam   (ATIVAN ) injection 1 mg (1 mg Intravenous Given 08/25/24 1418)  morphine  (PF) 4 MG/ML injection 4 mg (4 mg Intravenous Given 08/25/24 1425)  LORazepam  (ATIVAN ) injection 1 mg (1 mg Intravenous Given 08/25/24 1424)                                    Medical Decision Making Amount and/or Complexity of Data Reviewed Labs: ordered. Radiology: ordered.  Risk Prescription drug management.   This patient presents to the ED for concern of ams, this involves an extensive number of treatment options, and is a complaint that carries with it a high risk of complications and morbidity.  The differential diagnosis includes opiate od, infection, anemia, cva, electrolyte abn   Co morbidities that complicate the patient evaluation  SRD on HD (Tu, Th, Sat), HLD, CAD, PAD, GERD, COPD, DM2, s/p R bka, afib on Eliquis    Additional history obtained:  Additional history obtained from epic chart review External records from outside source obtained and reviewed including EMS report   Lab Tests:  I Ordered, and personally interpreted labs.  The pertinent results include:  cbc with nl wbc, hgb sl low at 11.5; istat chem 8 with k sl elevated at 5.2, bun 60 and cr 7.3; VBG with nl pH and CO2   Imaging Studies ordered:  I ordered imaging studies including cxr and ct head  I independently visualized and interpreted imaging which showed  CXR: Cardiomegaly with vascular congestion and mild edema.  CT head:  pending at shift change I agree with the radiologist interpretation   Cardiac Monitoring:  The patient was maintained on a cardiac monitor.  I personally viewed and interpreted the cardiac monitored which showed an underlying rhythm of: afib with rvr   Medicines ordered and prescription drug management:  I ordered medication including narcan   for sedation  Reevaluation of the patient after these  medicines showed that the patient improved I have reviewed the patients home medicines and have  made adjustments as needed   Test Considered:  ct   Critical Interventions:  narcan    Problem List / ED Course:  Unresponsive: pt not breathing well, unresponsive, pin point pupils.  Fentanyl  patch removed and pt given 2 mg narcan .  He immediately woke up and became agitated and was pulling at his monitor leads and iv.  He was then given ativan  and some morphine . He is now more calm and is breathing better, but he's still not normal ms Afib with rvr:  cardizem  started   Reevaluation:  After the interventions noted above, I reevaluated the patient and found that they have :improved   Social Determinants of Health:  SNF?   Dispostion:  After consideration of the diagnostic results and the patients response to treatment, I feel that the patent would benefit from admission.    CRITICAL CARE Performed by: Mliss Boyers   Total critical care time: 30 minutes  Critical care time was exclusive of separately billable procedures and treating other patients.  Critical care was necessary to treat or prevent imminent or life-threatening deterioration.  Critical care was time spent personally by me on the following activities: development of treatment plan with patient and/or surrogate as well as nursing, discussions with consultants, evaluation of patient's response to treatment, examination of patient, obtaining history from patient or surrogate, ordering and performing treatments and interventions, ordering and review of laboratory studies, ordering and review of radiographic studies, pulse oximetry and re-evaluation of patient's condition.      Final diagnoses:  ESRD on hemodialysis (HCC)  Opiate overdose, undetermined intent, initial encounter [facility])  Atrial fibrillation with RVR (HCC)  Altered mental status, unspecified altered mental status type    ED Discharge Orders     None          Boyers Mliss, MD 08/25/24 1553

## 2024-08-25 NOTE — ED Provider Notes (Addendum)
°  Physical Exam  BP (!) 146/92   Pulse (!) 114   Temp 97.7 F (36.5 C) (Axillary)   Resp (!) 22   Wt 115.2 kg   SpO2 97%   BMI 32.61 kg/m   Physical Exam  Procedures  Procedure Name: Intubation Date/Time: 08/25/2024 6:46 PM  Performed by: Yolande Lamar BROCKS, MDPre-anesthesia Checklist: Patient identified, Emergency Drugs available, Suction available, Patient being monitored and Timeout performed Oxygen  Delivery Method: Nasal cannula Preoxygenation: Pre-oxygenation with 100% oxygen  Induction Type: IV induction and Rapid sequence Ventilation: Mask ventilation without difficulty Laryngoscope Size: Mac and 4 Grade View: Grade II Tube size: 7.0 mm Number of attempts: 1 Airway Equipment and Method: Rigid stylet, Video-laryngoscopy and Stylet Placement Confirmation: ETT inserted through vocal cords under direct vision, Breath sounds checked- equal and bilateral, Positive ETCO2 and CO2 detector Secured at: 23 cm Tube secured with: ETT holder Dental Injury: Teeth and Oropharynx as per pre-operative assessment  Comments: Patient does appear to have a polyp on his epiglottis as well as 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      ED Course / MDM   Clinical Course as of 08/25/24 1920  Tue Aug 25, 2024  1612 Assumed care from Dr Dean. 68 yo M hx of  ESRD on HD (Tu, Th, Sat), HLD, CAD, PAD, GERD, COPD, DM2, s/p R bka, afib on Eliquis  who presented with altered mental status. Given narcan  which woke him up and he got agitated. Had a fentanyl  patch one. Had be be given ativan  to calm him afterwards. Still is altered. Awaiting head ct. Fluid overloaded and is on 2L [place] now. Only got one episode of HD. Will likely need admission. Also in AF RVR and is on dlitiazem. Is on eliquis  at home.  [RP]  1814 Per wife has been missing HD. Has been having lots of diarrhea. Has been on a fentanyl  patch for 15 years. Has bad neuropathy. No etoh or recreational drugs. Was fine at 0700 when she gave him a  biscuit. Hadn't woken up. Says this is common when he has UTIs. Cough has gotten worse.  Agrees with intubation but would not like for him to get CPR or defibrillation. [RP]  1854 Patient reassessed.  Is still very encephalopathic.  Not opening eyes to voice.  He is moving all 4 extremities but not following commands.  Did attempt several additional doses of Narcan  without improvement of his mental status.  To facilitate his workup and for airway protection he was intubated.  Discussed with Corean Reese PA from critical care who will evaluate the patient for admission [RP]  1858 Chest x-ray showing possible infiltrate.  Will start the patient on antibiotics at this point in time.  Dr Jerrye from nephrology will arrange for HD in the morning.  [RP]    Clinical Course User Index [RP] Yolande Lamar BROCKS, MD   Medical Decision Making Amount and/or Complexity of Data Reviewed Labs: ordered. Radiology: ordered.  Risk Prescription drug management. Decision regarding hospitalization.      Yolande Lamar BROCKS, MD 08/25/24 1919    Yolande Lamar BROCKS, MD 08/25/24 [phone]

## 2024-08-25 NOTE — ED Notes (Signed)
 Wife [name] [phone] would like an update asap and refuses to let her husband be discharged. She believes he needs to be admitted and helped in the hospital

## 2024-08-25 NOTE — Progress Notes (Signed)
 Pharmacy Antibiotic Note  [name] is a 68 y.o. male admitted on 08/25/2024 with pneumonia.  Pharmacy has been consulted for vancomycin  and cefepime  dosing.  Plan: Cefepime  1g IV q24h Vancomycin  2g IV x 1, f/u HD schedule for maintenance dosing Vancomycin  levels as needed Follow up cultures as available, clinical progress, length of tx  Weight: 115.2 kg (253 lb 15.5 oz)  Temp (24hrs), Avg:98.9 F (37.2 C), Min:97.7 F (36.5 C), Max:99.2 F (37.3 C)  Recent Labs  Lab 08/25/24 1415 08/25/24 1538 08/25/24 2040  WBC 6.3  --   --   CREATININE 6.63* 7.30*  --   LATICACIDVEN  --   --  1.5    Estimated Creatinine Clearance: 12.7 mL/min (A) (by C-G formula based on SCr of 7.3 mg/dL (H)).    Allergies[1]  Antimicrobials this admission: Vancomycin  12/16 >> Cefepime  12/16 >>  Dose adjustments this admission:  Microbiology results: 12/16 BCx: 12/16 MRSA PCR:  Thank you for allowing pharmacy to be a part of this patients care.  Rocky Slade, PharmD, BCPS 08/25/2024 9:02 PM     [1]  Allergies Allergen Reactions   Contrast Media [Iodinated Contrast Media] Shortness Of Breath and Other (See Comments)    Altered mental status     Ivp Dye [Iodinated Contrast Media] Anaphylaxis, Shortness Of Breath and Other (See Comments)    Altered mental status     Latex Rash    A severe rash appears after the first 24 hours of being placed   Tape Rash and Other (See Comments)    Rash after 1 day of use  Do not leave on longer then 3 days without changing

## 2024-08-25 NOTE — Progress Notes (Signed)
 PHARMACY - ANTICOAGULATION CONSULT NOTE  Pharmacy Consult for Heparin  (Eliquis  on hold) Indication: atrial fibrillation  Allergies[1]  Patient Measurements: Height: 6' 2 (188 cm) Weight: 115.2 kg (253 lb 15.5 oz) IBW/kg (Calculated) : 82.2 HEPARIN  DW (KG): 106.5  Vital Signs: Temp: 99.2 F (37.3 C) (12/16 2100) Temp Source: Axillary (12/16 2100) BP: 129/69 (12/16 2100) Pulse Rate: 83 (12/16 2100)  Labs: Recent Labs    08/25/24 1415 08/25/24 1421 08/25/24 1538 08/25/24 2032  HGB 11.5* 12.6* 12.9*  --   HCT 38.0* 37.0* 38.0*  --   PLT 116*  --   --   --   LABPROT  --   --   --  16.9*  INR  --   --   --  1.3*  CREATININE 6.63*  --  7.30* 7.16*    Estimated Creatinine Clearance: 13 mL/min (A) (by C-G formula based on SCr of 7.16 mg/dL (H)).   Medical History: Past Medical History:  Diagnosis Date   Carotid artery occlusion 11/10/2010   LEFT CAROTID ENDARTERECTOMY   Complication of anesthesia    BP WENT UP AT [facility]    COPD (chronic obstructive pulmonary disease) (HCC)    pt denies this dx as of 06/01/20 - no inhaler    Diabetes mellitus without complication (HCC)    Diverticulitis    Diverticulosis of colon (without mention of hemorrhage)    DJD (degenerative joint disease)    knees/hands/feet/back/neck   ESRD (end stage renal disease) on dialysis (HCC)    Fatty liver    Full dentures    GERD (gastroesophageal reflux disease)    H/O hiatal hernia    History of blood transfusion    with a past surical procedure per patient 06/01/20   History of right below knee amputation (HCC) 07/12/2016   Hyperlipidemia    Hypertension    Neuromuscular disorder (HCC)    peripheral neuropathy   Non-pressure chronic ulcer of other part of left foot limited to breakdown of skin (HCC) 11/12/2016   Nonrheumatic aortic (valve) stenosis 03/18/2024   Limited TTE 03/18/2024: EF 55-60, moderate LVH, normal RVSF, moderate LAE, mild MR, mild-moderate AS (mean 12, V-max 235 cm/s, DI  0.35), aortic root 39 mm, moderate pleural effusion, trivial pericardial effusion    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 Atherectomy left popliteal artery July 2012.    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   Unstable angina (HCC) 09/16/2018   Wears glasses     Medications:  Scheduled:   calcium  gluconate  1 g Intravenous Once   Followed by   calcium  gluconate  1 g Intravenous Once   Chlorhexidine  Gluconate Cloth  6 each Topical Daily   [START ON 08/26/2024] Chlorhexidine  Gluconate Cloth  6 each Topical Q0600   [START ON 08/27/2024] doxercalciferol   2 mcg Intravenous Q T,Th,Sa-HD   fentaNYL  (SUBLIMAZE ) injection  25-50 mcg Intravenous Once   insulin  aspart  0-6 Units Subcutaneous Q4H   mouth rinse  15 mL Mouth Rinse Q2H   pantoprazole  (PROTONIX ) IV  40 mg Intravenous QHS   sodium zirconium cyclosilicate   10 g Per Tube Once   vancomycin   variable dose per unstable renal function (pharmacist dosing)   Does not apply See admin instructions   Infusions:   ceFEPime  (MAXIPIME ) IV 1 g (08/25/24 2143)   diltiazem  (CARDIZEM ) infusion Stopped (08/25/24 1719)   fentaNYL  infusion INTRAVENOUS Stopped (08/25/24 2147)   norepinephrine  (LEVOPHED ) Adult infusion 8 mcg/min (08/25/24 2100)   propofol  (DIPRIVAN ) infusion 30 mcg/kg/min (08/25/24 2100)   vancomycin      PRN: fentaNYL , mouth rinse, polyethylene glycol  Assessment: 68 yo male on chronic eliquis  for hx afib. Pharmacy consulted to dose IV heparin  while intubated/NPO. Last dose of eliquis  not documented, but none administered inpatient, so likely >12hrs ago PTA.  INR 1.3 aPTT and heparin  level  pending Hgb 12.9 Plts 116  Goal of  Therapy:  Heparin  level 0.3-0.7 units/ml aPTT 66-102 seconds Monitor platelets by anticoagulation protocol: Yes   Plan:  No heparin  bolus Heparin  1200 units/hr Check aPTT and heparin  level in 8hrs and daily until correlation Continue to monitor H&H and platelets  Rocky Slade, PharmD, BCPS 08/25/2024,10:10 PM  Please check AMION for all [facility] Pharmacy phone numbers After 10:00 PM, call Main Pharmacy [phone]       [1]  Allergies Allergen Reactions   Contrast Media [Iodinated Contrast Media] Shortness Of Breath and Other (See Comments)    Altered mental status     Ivp Dye [Iodinated Contrast Media] Anaphylaxis, Shortness Of Breath and Other (See Comments)    Altered mental status     Latex Rash    A severe rash appears after the first 24 hours of being placed   Tape Rash and Other (See Comments)    Rash after 1 day of use  Do not leave on longer then 3 days without changing

## 2024-08-25 NOTE — H&P (Cosign Needed Addendum)
 NAME:  [name], MRN:  [id number], DOB:  February 25, 1956, LOS: 0 ADMISSION DATE:  08/25/2024, CONSULTATION DATE:  08/25/24 REFERRING MD:  Dean PARAS., CHIEF COMPLAINT:  AMS   History of Present Illness:  [name] is a 68 year old male who resides at [facility], with past medical history significant for CAD s/p stents on Plavix , NSTEMI s/p in-stent restenosis to RCA; severe stenosis in Lcx s/p DES 11/24/23, ESRD on HD TTS, PAD s/p bilateral BKS, COPD, DM2, HTN, Afib on Eliquis  who presented to the ED after being found unresponsive during dialysis earlier today. Patient was reportedly one hour into dialysis when he was found unresponsive. In the ED, he remained unresponsive with labored breathing and pinpoint pupils. A fentanyl  patch was found applied to his chest and he was given 2mg  Narcan  for which he then aroused. Of note, varying reports of 1 vs 2 fentanyl  patch(es) found on patient, however cannot confirm at this time. Patient reportedly was then in Afib RVR, started on a Diltiazem  gtt, he remained encephalopathic despite additional Narcan  and was subsequently intubated. Nephrology was consulted with plans to resume dialysis 12/17. PCCM consulted for ICU admission.  On exam in the ED, patient hypotensive with manual BP 70s/50s, severe volume overload, will start Levo. Labs notable for elevated troponin/pro-BNP and severe metabolic derangements c/w ESRD. Work-up for metabolic, infectious, cardiac etiology ongoing.  Wife at bedside, initially had made patient DNR-L to the ED provider over the phone, however now would like to make patient Full Code and states he has expressed wanting full resuscitation should he cardiac arrest. Wife is understanding of his significant co-morbidities and overall poor health and would likely consider stopping prolonged resuscitative efforts if outcome appears futile.  Pertinent Medical History:   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   LEFT CAROTID ENDARTERECTOMY   Complication of anesthesia    BP WENT UP AT [facility]    COPD (chronic obstructive pulmonary disease) (HCC)    pt denies this dx as of 06/01/20 - no inhaler    Diabetes mellitus without complication (HCC)    Diverticulitis    Diverticulosis of colon (without mention of hemorrhage)    DJD (degenerative joint disease)    knees/hands/feet/back/neck   ESRD (end stage renal disease) on dialysis (HCC)    Fatty liver    Full dentures    GERD (gastroesophageal reflux disease)    H/O hiatal hernia    History of blood transfusion    with a past surical procedure per patient 06/01/20   History of right below knee amputation (HCC) 07/12/2016   Hyperlipidemia    Hypertension    Neuromuscular disorder (HCC)    peripheral neuropathy   Non-pressure chronic ulcer of other part of left foot limited to breakdown of skin (HCC) 11/12/2016   Nonrheumatic aortic (valve) stenosis 03/18/2024   Limited TTE 03/18/2024: EF 55-60, moderate LVH, normal RVSF, moderate LAE, mild MR, mild-moderate AS (mean 12, V-max 235 cm/s, DI 0.35), aortic root 39 mm, moderate pleural effusion, trivial pericardial effusion    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 Atherectomy left popliteal artery July 2012.    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 rate 20, weight 115.2 kg, SpO2 99%.    Vent Mode: PRVC FiO2 (%):  [60 %-100 %] 60 % Set Rate:  [18 bmp] 18 bmp Vt Set:  [500 mL] 500 mL PEEP:  [5 cmH20] 5 cmH20 Plateau Pressure:  [20 cmH20] 20 cmH20  No intake or output data in the 24 hours ending 08/25/24 1951 Filed Weights   08/25/24 1900  Weight: 115.2 kg   Examination: General: acute on chronically-ill elderly obese male, in NAD, intubated and sedated HEENT: AT/[place], PERRL, 2mm bilaterally Pulm: ETT, ventilator-assisted breaths, coarse bilaterally CV: irregularly-irregular, no m/g/r GI: soft, obese Extremities: bilateral BKA, L AV fistula w/ palpable thrill, audible bruit, no erythema, edema  Neuro: GCS 3TS E1 V1 M1, recently intubated sedated   Resolved Problem List:   Assessment and Plan:   Acute toxic-metabolic encephalopathy likely secondary to opiate intoxication with underlying ESRD ESRD on HD TTS-wife reports hx of non-compliance RLS -Patient found to be unresponsive w/ 1 vs 2 transdermal 50mg  Fentanyl  patch(es) applied; aroused after receiving 2mg  Narcan . Unable to confirm at this time exact number of fentanyl  patches. -Nephrology following; plan for HD tomorrow unless emergent indications overnight (refractory hyperkalemia, worsening metabolic acidosis) -Admission sCr 6.63/ BUN 53; K 5.4; P 7.1 -Tbili 0.5; AST/ALT normal, ALP 135; no evidence of hepatocellular or cholestatic injury; GGT pending -Trend CMP -Replace electrolytes as indicated -Hold home sevelemer while NPO -Hold requip ; restart once extubated -CTH to r/o intra-cranial abnormality  PAD in setting of mechanical intubation Chronic pain; prescribed 50mg  Fentanyl  patch q72 hours; per wife on med for 15+ years; also on Percocet  7.5-325 mg q8 PRN -Continue Prop/fentanyl  gtt -Optimize MMPR -Hold  home Gabapentin  w/ acute encephalopathy; reassess with neurological improvement -SAT daily  Acute hypoxic respiratory failure likely secondary to opioid-induced respiratory depression and volume overload in setting of ESRD C/f pneumonia; recently treated for CAP 08/11/24 COPD OSA -Intubated in ED -LPV; Wean ventilator to maintain SpO2 >90% -CAP coverage with cefepime /vanc; stop vanc if MRSA nares negative -VAP bundle -Bronchopulmonary hygiene; duonebs PRN -ABG/ CXR PRN -SBT daily  Chronic Afib with acute onset RVR; on Eliquis ; follows cards outpt- prev on Amio stopped d/t tachy-brady syndrome req TPM-poor PPM candidate; CAD s/p stents (2016); with elevated troponin; on Plavix  NSTEMI 11/24/23 s/p in-stent restenosis to RCA; severe stenosis in Lcx s/p DES HFpEF-TTE 02/17/24 EF 50-55% Pericardial effusion s/p pericardiocentesis 02/2024 Hypertension PAD s/p bilateral BKA Post-intubation/medication-related hypotension HLD -Initially started on diltiazem  gtt for afib RVR; became hypotensive; gtt stopped; rate has since been controlled -Wean vasopressors to maintain MAP >65 & SBP >90 -Eliquis  held with pending CTH; if negative for acute intracranial abnormality; will plan to start Heparin  while inpatient -Given 2g Ca gluconate -Will obtain coags -Troponin 183; pBNP >35k -Will obtain TTE to r/o NSTEMI in setting of hx of elevated troponin; trend trop until peaked -Restart Lipitor  -Hold home antihypertensives  T2DM -A1c 6.4 (08/12/24) -CBG q4 +SSI -Hold  home insulin   GI ppx: Famotidine   FEN -No mIVF in setting of volume overload -NPO; OGT to liws; start TF if unable to extubate in morning  Labs:  CBC: Recent Labs  Lab 08/25/24 1415 08/25/24 1421 08/25/24 1538  WBC 6.3  --   --   NEUTROABS 3.8  --   --   HGB 11.5* 12.6* 12.9*  HCT 38.0* 37.0* 38.0*  MCV 89.8  --   --   PLT 116*  --   --     Basic Metabolic Panel: Recent Labs  Lab 08/25/24 1415 08/25/24 1421  08/25/24 1538  NA 137 135 136  K 5.4* 5.1 5.2*  CL 98  --  100  CO2 23  --   --   GLUCOSE 125*  --  121*  BUN 53*  --  60*  CREATININE 6.63*  --  7.30*  CALCIUM  7.9*  --   --    GFR: Estimated Creatinine Clearance: 12.7 mL/min (A) (by C-G formula based on SCr of 7.3 mg/dL (H)). Recent Labs  Lab 08/25/24 1415  WBC 6.3    Liver Function Tests: No results for input(s): AST, ALT, ALKPHOS, BILITOT, PROT, ALBUMIN  in the last 168 hours. No results for input(s): LIPASE, AMYLASE in the last 168 hours. No results for input(s): AMMONIA in the last 168 hours.  ABG    Component Value Date/Time   PHART 7.364 02/13/2024 1550   PCO2ART 36.7 02/13/2024 1550   PO2ART 112 (H) 02/13/2024 1550   HCO3 25.7 08/25/2024 1421   TCO2 23 08/25/2024 1538   ACIDBASEDEF 4.0 (H) 02/13/2024 1550   O2SAT 78 08/25/2024 1421     Coagulation Profile: No results for input(s): INR, PROTIME in the last 168 hours.  Cardiac Enzymes: No results for input(s): CKTOTAL, CKMB, CKMBINDEX, TROPONINI in the last 168 hours.  HbA1C: Hgb A1c MFr Bld  Date/Time Value Ref Range Status  08/12/2024 02:14 AM 6.4 (H) 4.8 - 5.6 % Final    Comment:    (NOTE)         Prediabetes: 5.7 - 6.4         Diabetes: >6.4         Glycemic control for adults with diabetes: <7.0   02/01/2024 11:19 AM 5.4 4.8 - 5.6 % Final    Comment:    (NOTE) Pre diabetes:          5.7%-6.4%  Diabetes:              >6.4%  Glycemic control for   <7.0% adults with diabetes     CBG: Recent Labs  Lab 08/25/24 1412  GLUCAP 121*    Review of Systems:   Unable to assess in setting of intubation/sedation  Past Medical History:  He,  has a past medical history of Carotid artery occlusion (11/10/2010), Complication of anesthesia, COPD (chronic obstructive pulmonary disease) (HCC), Diabetes mellitus without complication (HCC), Diverticulitis, Diverticulosis of colon (without mention of hemorrhage), DJD  (degenerative joint disease), ESRD (end stage renal disease) on dialysis (HCC), Fatty liver, Full dentures, GERD (gastroesophageal reflux disease), H/O hiatal hernia, History of blood transfusion, History of right below knee amputation (HCC) (07/12/2016), Hyperlipidemia, Hypertension, Neuromuscular disorder (HCC), Non-pressure chronic ulcer of other part of left foot limited to breakdown of skin (HCC) (11/12/2016), Nonrheumatic aortic (valve) stenosis (03/18/2024), Osteomyelitis (HCC), PAD (peripheral artery disease), Pseudoclaudication (11/15/2018), S/P BKA (below knee amputation), right (HCC) (09/18/2018), Sleep apnea, Slurred speech, Status post reversal of ileostomy (05/21/2018), Trifascicular block (11/15/2018), Unstable  angina (HCC) (09/16/2018), and Wears glasses.   Surgical History:   Past Surgical History:  Procedure Laterality Date   ABDOMINAL AORTOGRAM W/LOWER EXTREMITY N/A 06/23/2021   Procedure: ABDOMINAL AORTOGRAM W/LOWER EXTREMITY;  Surgeon: Elmira Newman PARAS, MD;  [facility]: [facility];  Service: Cardiovascular;  Laterality: N/A;   AMPUTATION  11/05/2011   Procedure: AMPUTATION RAY;  Surgeon: Norleen Armor, MD;  Location: [facility] OR;  Service: Orthopedics;  Laterality: Right;  Amputation of Right 4&5th Toes   AMPUTATION Left 11/26/2012   Procedure: AMPUTATION RAY;  Surgeon: Norleen Armor, MD;  Location: [facility] OR;  Service: Orthopedics;  Laterality: Left;  fourth ray amputation   AMPUTATION Right 08/27/2014   Procedure: Transmetatarsal Amputation;  Surgeon: Jerona Harden GAILS, MD;  Location: [facility] OR;  Service: Orthopedics;  Laterality: Right;   AMPUTATION Right 01/14/2015   Procedure: AMPUTATION BELOW KNEE;  Surgeon: Jerona Harden GAILS, MD;  Location: [facility] OR;  Service: Orthopedics;  Laterality: Right;   AMPUTATION Left 10/21/2015   Procedure: Left Foot 5th Ray Amputation;  Surgeon: Jerona Harden GAILS, MD;  Location: [facility] OR;  Service: Orthopedics;  Laterality: Left;   AMPUTATION Left 07/04/2022   Procedure:  LEFT BELOW KNEE AMPUTATION;  Surgeon: Harden Jerona GAILS, MD;  Location: [facility] OR;  Service: Orthopedics;  Laterality: Left;   ANTERIOR FUSION CERVICAL SPINE  02/06/06   C4-5, C5-6, C6-7; SURGEON DR. MAX COHEN   AV FISTULA PLACEMENT Left 06/02/2020   Procedure: ARTERIOVENOUS (AV) FISTULA CREATION LEFT;  Surgeon: Sheree Penne Bruckner, MD;  Location: [facility] OR;  Service: Vascular;  Laterality: Left;   BACK SURGERY     x 3   BASCILIC VEIN TRANSPOSITION Left 07/21/2020   Procedure: LEFT UPPER ARM ATERIOVENOUS SUPERFISTULALIZATION;  Surgeon: Sheree Penne Bruckner, MD;  Location: [facility] OR;  Service: Vascular;  Laterality: Left;   BELOW KNEE LEG AMPUTATION Right    CARDIAC CATHETERIZATION  10/31/04   2009   CAROTID ENDARTERECTOMY  11/10/10   CAROTID ENDARTERECTOMY Left 11/10/2010   Subtotal occlusion of left internal carotid artery with left hemispheric transient ischemic attacks.   CAROTID STENT     CARPAL TUNNEL RELEASE Right 10/21/2013   Procedure: RIGHT CARPAL TUNNEL RELEASE;  Surgeon: Arley JONELLE Curia, MD;  Location:  SURGERY CENTER;  Service: Orthopedics;  Laterality: Right;   CHOLECYSTECTOMY     COLON SURGERY     COLONOSCOPY     COLOSTOMY REVERSAL  05/21/2018   ileostomy reversal   CORONARY LITHOTRIPSY N/A 11/24/2023   Procedure: CORONARY LITHOTRIPSY;  Surgeon: Jordan, Peter M, MD;  Location: [facility] INVASIVE CV LAB;  Service: Cardiovascular;  Laterality: N/A;   CORONARY STENT INTERVENTION N/A 11/24/2023   Procedure: CORONARY STENT INTERVENTION;  Surgeon: Jordan, Peter M, MD;  Location: [facility] INVASIVE CV LAB;  Service: Cardiovascular;  Laterality: N/A;   CYSTOSCOPY WITH STENT PLACEMENT Bilateral 01/13/2018   Procedure: CYSTOSCOPY WITH BILATERAL URETERAL CATHETER PLACEMENT;  Surgeon: Cam Morene ORN, MD;  Location: [facility] ORS;  Service: Urology;  Laterality: Bilateral;   ESOPHAGEAL MANOMETRY Bilateral 07/19/2014   Procedure: ESOPHAGEAL MANOMETRY (EM);  Surgeon: Gordy CHRISTELLA Starch, MD;  Location: [facility];   Service: Gastroenterology;  Laterality: Bilateral;   EYE SURGERY Bilateral 2020   cataract   FEMORAL ARTERY STENT     x6   FINGER SURGERY     FOOT SURGERY  04/25/2016    EXCISION BASE 5TH METATARSAL AND PARTIAL CUBOID LEFT FOOT   HERNIA REPAIR     LEFT INGUINAL AND UMBILICAL REPAIRS  HERNIA REPAIR     I & D EXTREMITY Left 04/25/2016   Procedure: EXCISION BASE 5TH METATARSAL AND PARTIAL CUBOID LEFT FOOT;  Surgeon: Jerona LULLA Sage, MD;  Location: [facility] OR;  Service: Orthopedics;  Laterality: Left;   ILEOSTOMY  01/13/2018   Procedure: ILEOSTOMY;  Surgeon: Signe Mitzie LABOR, MD;  Location: [facility] ORS;  Service: General;;   ILEOSTOMY CLOSURE N/A 05/21/2018   Procedure: ILEOSTOMY REVERSAL ERAS PATHWAY;  Surgeon: Signe Mitzie LABOR, MD;  Location: [facility] OR;  Service: General;  Laterality: N/A;   IR RADIOLOGIST EVAL & MGMT  11/19/2017   IR RADIOLOGIST EVAL & MGMT  12/03/2017   IR RADIOLOGIST EVAL & MGMT  12/18/2017   JOINT REPLACEMENT Right 2001   Total knee   LAMINECTOMY     X 3 LUMBAR AND X 2 CERVICAL SPINE OPERATIONS   LAPAROSCOPIC CHOLECYSTECTOMY W/ CHOLANGIOGRAPHY  11/09/04   SURGEON DR. DEWARD CANDIE NULL   LEFT HEART CATH AND CORONARY ANGIOGRAPHY N/A 09/16/2018   Procedure: LEFT HEART CATH AND CORONARY ANGIOGRAPHY;  Surgeon: Elmira Newman PARAS, MD;  [facility];  Service: Cardiovascular;  Laterality: N/A;   LEFT HEART CATH AND CORONARY ANGIOGRAPHY N/A 11/24/2023   Procedure: LEFT HEART CATH AND CORONARY ANGIOGRAPHY;  Surgeon: Jordan, Peter M, MD;  Location: [facility] INVASIVE CV LAB;  Service: Cardiovascular;  Laterality: N/A;   LEFT HEART CATHETERIZATION WITH CORONARY ANGIOGRAM N/A 10/29/2014   Procedure: LEFT HEART CATHETERIZATION WITH CORONARY ANGIOGRAM;  Surgeon: Erick JONELLE Bergamo, MD;  Location: [facility] CATH LAB;  Service: Cardiovascular;  Laterality: N/A;   LIGATION OF COMPETING BRANCHES OF ARTERIOVENOUS FISTULA Left 07/21/2020   Procedure: LIGATION OF COMPETING BRANCHES OF LEFT UPPER ARM  ARTERIOVENOUS FISTULA;  Surgeon: Sheree Penne Bruckner, MD;  Location: [facility] OR;  Service: Vascular;  Laterality: Left;   LOWER EXTREMITY ANGIOGRAM N/A 03/19/2012   Procedure: LOWER EXTREMITY ANGIOGRAM;  Surgeon: Bruckner JONETTA Cash, MD;  Location: [facility] CATH LAB;  Service: Cardiovascular;  Laterality: N/A;   LOWER EXTREMITY ANGIOGRAPHY N/A 06/20/2021   Procedure: LOWER EXTREMITY ANGIOGRAPHY;  Surgeon: Elmira Newman PARAS, MD;  [facility];  Service: Cardiovascular;  Laterality: N/A;   NECK SURGERY     PARTIAL COLECTOMY N/A 01/13/2018   Procedure: LAPAROSCOPIC ASSISTED   SIGMOID COLECTOMY ILEOSTOMY;  Surgeon: Signe Mitzie LABOR, MD;  Location: [facility]S;  Service: General;  Laterality: N/A;   PENILE PROSTHESIS IMPLANT  08/14/05   INFRAPUBIC INSERTION OF INFLATABLE PENILE PROSTHESIS; SURGEON DR. JANIT   PENILE PROSTHESIS IMPLANT     PERCUTANEOUS CORONARY STENT INTERVENTION (PCI-S) Right 10/29/2014   Procedure: PERCUTANEOUS CORONARY STENT INTERVENTION (PCI-S);  Surgeon: Erick JONELLE Bergamo, MD;  Location: [facility] CATH LAB;  Service: Cardiovascular;  Laterality: Right;   PERICARDIOCENTESIS N/A 02/13/2024   Procedure: PERICARDIOCENTESIS;  Surgeon: Elmira Newman PARAS, MD;  [facility];  Service: Cardiovascular;  Laterality: N/A;   PERIPHERAL VASCULAR INTERVENTION Left 06/23/2021   Procedure: PERIPHERAL VASCULAR INTERVENTION;  Surgeon: Elmira Newman PARAS, MD;  [facility];  Service: Cardiovascular;  Laterality: Left;   REMOVAL OF PENILE PROSTHESIS N/A 06/14/2021   Procedure: Removal of THREE piece inflatable penile prosthesis;  Surgeon: Carolee Sherwood JONETTA DOUGLAS, MD;  Location: [facility] OR;  Service: Urology;  Laterality: N/A;   SHOULDER ARTHROSCOPY     SPINE SURGERY     TEMPORARY PACEMAKER N/A 02/13/2024   Procedure: TEMPORARY PACEMAKER;  Surgeon: Elmira Newman PARAS, MD;  [facility];  Service: Cardiovascular;  Laterality: N/A;   TOE AMPUTATION Left     TONSILLECTOMY     TOTAL KNEE ARTHROPLASTY  07/2002   RIGHT KNEE ; SURGEON  DR. [name] ALSO HAD ARTHROSCOPIC RIGHT KNEE IN  10/2001   TOTAL KNEE ARTHROPLASTY     ULNAR NERVE TRANSPOSITION Right 10/21/2013   Procedure: RIGHT ELBOW  ULNAR NERVE DECOMPRESSION;  Surgeon: Arley JONELLE Curia, MD;  Location: [facility];  Service: Orthopedics;  Laterality: Right;     Social History:   reports that he has been smoking cigarettes. He has a 35 pack-year smoking history. He has never used smokeless tobacco. He reports that he does not currently use alcohol . He reports that he does not use drugs.   Family History:  His family history includes Arthritis in an other family member; Cancer in his father and sister; Colon cancer in his brother; Diabetes in his brother and father; Heart attack in his brother and father; Heart disease in his father; Heart disease (age of onset: 40) in his brother; Hyperlipidemia in his brother, father, and sister; Hypertension in his brother, father, sister, son, and another family member; Varicose Veins in his father.   Allergies Allergies[1]   Home Medications  Prior to Admission medications  Medication Sig Start Date End Date Taking? Authorizing Provider  amLODipine  (NORVASC ) 10 MG tablet Take 5 mg by mouth daily.    [provider]  apixaban  (ELIQUIS ) 5 MG TABS tablet Take 1 tablet (5 mg total) by mouth 2 (two) times daily. 01/14/22 09/09/24  Gonfa, Taye T, MD  atorvastatin  (LIPITOR ) 80 MG tablet Take 1 tablet (80 mg total) by mouth at bedtime. 11/25/23   Amoako, Prince, MD  B Complex-C-Zn-Folic Acid  (DIALYVITE /ZINC ) TABS Take 1 tablet by mouth See admin instructions. Give 1 tablet by mouth 3 days a week prior to dialysis on Tuesday, Thursday, Saturday. 10/04/21   [provider]  clopidogrel  (PLAVIX ) 75 MG tablet Take 1 tablet (75 mg total) by mouth daily with breakfast. Patient taking differently: Take 75 mg by mouth daily. 11/25/23 11/24/24  Amoako,  Prince, MD  colchicine  0.6 MG tablet Take 0.5 tablets (0.3 mg total) by mouth daily. 02/21/24   Gonfa, Taye T, MD  famotidine  (PEPCID ) 20 MG tablet Take 20 mg by mouth See admin instructions. Give 1 tablet (20mg ) by mouth every other morning.    [provider]  fentaNYL  (DURAGESIC ) 50 MCG/HR Place 1 patch onto the skin every 3 (three) days. 08/12/24   Raenelle Coria, MD  folic acid  (FOLVITE ) 1 MG tablet Take 1 mg by mouth daily. 09/18/21   [provider]  gabapentin  (NEURONTIN ) 100 MG capsule Take 100 mg by mouth 3 (three) times daily.    [provider]  insulin  aspart (NOVOLOG  FLEXPEN) 100 UNIT/ML FlexPen Inject 4-16 Units into the skin See admin instructions. Inject 4 units subcutaneously three times daily plus 0-12 units per sliding scale: 70 - 150 : 0 units 151-200 : 2 units 201-250 : 4 units 251-300 : 6 units 301-350 : 8 units 351-400 : 10 units 401-450 : 12 units > 400 : give 12 units and recheck in 1 hour - if still > 400, call MD/NP for further orders.    [provider]  ipratropium-albuterol  (DUONEB) 0.5-2.5 (3) MG/3ML SOLN  05/26/24   [provider]  isosorbide  mononitrate (IMDUR ) 60 MG 24 hr tablet Take 60 mg by mouth daily. Give 60mg  by mouth one time a day    [provider]  lidocaine -prilocaine  (EMLA ) cream Apply 1 application  topically See admin instructions. Apply topically 3 days a week prior to dialysis on Tuesday, Thursday, Saturday. 06/06/21   [provider]  loratadine  (CLARITIN ) 10 MG tablet Take 10 mg by mouth daily.    [provider]  mineral oil-hydrophilic petrolatum (AQUAPHOR) ointment Apply 1 Application topically every 8 (eight) hours as needed for irritation or dry skin.    [provider]  nitroGLYCERIN  (NITROSTAT ) 0.4 MG SL tablet Place 0.4 mg under the tongue every 5 (five) minutes as needed for chest pain. 01/06/22   [provider]  oxyCODONE -acetaminophen  (PERCOCET )  7.5-325 MG tablet Take 1 tablet by mouth every 8 (eight) hours as needed for severe pain (pain score 7-10). 08/12/24   Raenelle Coria, MD  promethazine  (PHENERGAN ) 12.5 MG suppository Place 12.5 mg rectally daily as needed for nausea or vomiting.    [provider]  rOPINIRole  (REQUIP ) 2 MG tablet Take 2 mg by mouth at bedtime.    [provider]  sevelamer  carbonate (RENVELA ) 800 MG tablet Take 2 tablets (1,600 mg total) by mouth 3 (three) times daily with meals. 02/04/24   Amin, Sumayya, MD  tamsulosin  (FLOMAX ) 0.4 MG CAPS capsule Take 0.4 mg by mouth daily.    [provider]     Critical care time: 60 minutes   Rahmon Heigl, DNP, AGACNP-BC [facility]  Please see [email or web address] for pager details.  From 7A-7P if no response, please call [phone]. After hours, please call ELink [phone].     [1]  Allergies Allergen Reactions   Contrast Media [Iodinated Contrast Media] Shortness Of Breath and Other (See Comments)    Altered mental status     Ivp Dye [Iodinated Contrast Media] Anaphylaxis, Shortness Of Breath and Other (See Comments)    Altered mental status     Latex Rash    A severe rash appears after the first 24 hours of being placed   Tape Rash and Other (See Comments)    Rash after 1 day of use  Do not leave on longer then 3 days without changing

## 2024-08-25 NOTE — Progress Notes (Signed)
 eLink Physician-Brief Progress Note Patient Name: [name] DOB: 1956/07/22 MRN: [id number]   Date of Service  08/25/2024  HPI/Events of Note  Patient with ESRD-DD who became unresponsive while receiving dialysis and was transported to [facility] hospital where he was intubated in the ED for airway protection, work up in progress.  eICU Interventions  New Patient Evaluation.        Michelyn Scullin U Becky Colan 08/25/2024, 9:21 PM

## 2024-08-25 NOTE — ED Triage Notes (Signed)
 Pt BIB [facility] from dialysis for change in LOC. Pt was dialyzed for 1 hour, when staff went to check on him he was minimally responsive. Responsive to pain per EMS, was last dialyzed 1 week ago, has 20kg extra fluid per wife. 1 duoneb given by EMS. RR 14-16 spontaneous, 91% RA, 95% on neb. Pt was hospitalized 2 weeks ago for PNA, has finished abx treatment but still has a productive cough per wife. All other VSS per EMS. Pt normally Aox4, GCS 9 currently.

## 2024-08-26 ENCOUNTER — Inpatient Hospital Stay (HOSPITAL_COMMUNITY)

## 2024-08-26 ENCOUNTER — Encounter (HOSPITAL_COMMUNITY): Payer: Self-pay

## 2024-08-26 DIAGNOSIS — R9431 Abnormal electrocardiogram [ECG] [EKG]: Secondary | ICD-10-CM

## 2024-08-26 DIAGNOSIS — I21A1 Myocardial infarction type 2: Secondary | ICD-10-CM

## 2024-08-26 DIAGNOSIS — J449 Chronic obstructive pulmonary disease, unspecified: Secondary | ICD-10-CM

## 2024-08-26 LAB — RESP PANEL BY RT-PCR (RSV, FLU A&B, COVID)  RVPGX2
Influenza A by PCR: NEGATIVE
Influenza B by PCR: NEGATIVE
Resp Syncytial Virus by PCR: NEGATIVE
SARS Coronavirus 2 by RT PCR: NEGATIVE

## 2024-08-26 LAB — BASIC METABOLIC PANEL WITH GFR
Anion gap: 20 — ABNORMAL HIGH (ref 5–15)
BUN: 66 mg/dL — ABNORMAL HIGH (ref 8–23)
CO2: 20 mmol/L — ABNORMAL LOW (ref 22–32)
Calcium: 8 mg/dL — ABNORMAL LOW (ref 8.9–10.3)
Chloride: 94 mmol/L — ABNORMAL LOW (ref 98–111)
Creatinine, Ser: 7.96 mg/dL — ABNORMAL HIGH (ref 0.61–1.24)
GFR, Estimated: 7 mL/min — ABNORMAL LOW (ref >60)
Glucose, Bld: 132 mg/dL — ABNORMAL HIGH (ref 70–99)
Potassium: 5.6 mmol/L — ABNORMAL HIGH (ref 3.5–5.1)
Sodium: 134 mmol/L — ABNORMAL LOW (ref 135–145)

## 2024-08-26 LAB — ECHOCARDIOGRAM COMPLETE
AR max vel: 1.17 cm2
AV Area VTI: 1.22 cm2
AV Area mean vel: 1.08 cm2
AV Mean grad: 23.8 mmHg
AV Peak grad: 40.1 mmHg
Ao pk vel: 3.17 m/s
Area-P 1/2: 3.72 cm2
Calc EF: 29.3 %
Height: 74 in
MV M vel: 5.03 m/s
MV Peak grad: 101.2 mmHg
MV VTI: 2.71 cm2
S' Lateral: 5.05 cm
Single Plane A2C EF: 31.1 %
Single Plane A4C EF: 24.3 %
Weight: 4063.52 [oz_av]

## 2024-08-26 LAB — PHOSPHORUS: Phosphorus: 7.8 mg/dL — ABNORMAL HIGH (ref 2.5–4.6)

## 2024-08-26 LAB — CBC
HCT: 37.4 % — ABNORMAL LOW (ref 39.0–52.0)
Hemoglobin: 11.9 g/dL — ABNORMAL LOW (ref 13.0–17.0)
MCH: 27.7 pg (ref 26.0–34.0)
MCHC: 31.8 g/dL (ref 30.0–36.0)
MCV: 87.2 fL (ref 80.0–100.0)
Platelets: 152 K/uL (ref 150–400)
RBC: 4.29 MIL/uL (ref 4.22–5.81)
RDW: 17.2 % — ABNORMAL HIGH (ref 11.5–15.5)
WBC: 10.5 K/uL (ref 4.0–10.5)
nRBC: 0 % (ref 0.0–0.2)

## 2024-08-26 LAB — URINE DRUG SCREEN
Amphetamines: NEGATIVE
Barbiturates: NEGATIVE
Benzodiazepines: NEGATIVE
Cocaine: NEGATIVE
Fentanyl: POSITIVE — AB
Methadone Scn, Ur: NEGATIVE
Opiates: NEGATIVE
Tetrahydrocannabinol: NEGATIVE

## 2024-08-26 LAB — GLUCOSE, CAPILLARY
Glucose-Capillary: 111 mg/dL — ABNORMAL HIGH (ref 70–99)
Glucose-Capillary: 81 mg/dL (ref 70–99)
Glucose-Capillary: 107 mg/dL — ABNORMAL HIGH (ref 70–99)
Glucose-Capillary: 104 mg/dL — ABNORMAL HIGH (ref 70–99)
Glucose-Capillary: 122 mg/dL — ABNORMAL HIGH (ref 70–99)
Glucose-Capillary: 123 mg/dL — ABNORMAL HIGH (ref 70–99)

## 2024-08-26 LAB — APTT: aPTT: 35 s (ref 24–36)

## 2024-08-26 LAB — MAGNESIUM: Magnesium: 1.8 mg/dL (ref 1.7–2.4)

## 2024-08-26 LAB — HEPARIN LEVEL (UNFRACTIONATED)
Heparin Unfractionated: <0.1 [IU]/mL — ABNORMAL LOW (ref 0.30–0.70)
Heparin Unfractionated: <0.1 [IU]/mL — ABNORMAL LOW (ref 0.30–0.70)

## 2024-08-26 LAB — GAMMA GT: GGT: 38 U/L (ref 7–50)

## 2024-08-26 LAB — MRSA NEXT GEN BY PCR, NASAL: MRSA by PCR Next Gen: DETECTED — AB

## 2024-08-26 LAB — HEPATITIS B SURFACE ANTIGEN: Hepatitis B Surface Ag: NONREACTIVE

## 2024-08-26 MED ORDER — HEPARIN SODIUM (PORCINE) 1000 UNIT/ML IJ SOLN
2000.0000 [IU] | Freq: Once | INTRAMUSCULAR | Status: AC
  Administered 2024-08-26: 15:00:00 2000 [IU] via INTRAVENOUS

## 2024-08-26 MED ORDER — ANTICOAGULANT SODIUM CITRATE 4% (200MG/5ML) IV SOLN: 5.0000 mL | Status: DC | PRN

## 2024-08-26 MED ORDER — HEPARIN SODIUM (PORCINE) 1000 UNIT/ML DIALYSIS
1000.0000 [IU] | INTRAMUSCULAR | Status: DC | PRN
  Filled 2024-08-26 (×5): qty 1

## 2024-08-26 MED ORDER — VANCOMYCIN HCL IN DEXTROSE 1-5 GM/200ML-% IV SOLN
1000.0000 mg | Freq: Once | INTRAVENOUS | Status: AC
  Administered 2024-08-26: 18:00:00 1000 mg via INTRAVENOUS
  Filled 2024-08-26: qty 200

## 2024-08-26 MED ORDER — PERFLUTREN LIPID MICROSPHERE
1.0000 mL | INTRAVENOUS | Status: AC | PRN
  Administered 2024-08-26: 10:00:00 3 mL via INTRAVENOUS

## 2024-08-26 MED ORDER — ALTEPLASE 2 MG IJ SOLR: 2.0000 mg | Freq: Once | INTRAMUSCULAR | Status: DC | PRN

## 2024-08-26 MED ORDER — LIDOCAINE-PRILOCAINE 2.5-2.5 % EX CREA: 1.0000 | TOPICAL_CREAM | CUTANEOUS | Status: DC | PRN

## 2024-08-26 MED ORDER — MUPIROCIN 2 % EX OINT
1.0000 | TOPICAL_OINTMENT | Freq: Two times a day (BID) | CUTANEOUS | Status: AC
  Administered 2024-08-26 – 2024-08-30 (×10): 1 via NASAL
  Filled 2024-08-26 (×2): qty 22

## 2024-08-26 MED ORDER — RENA-VITE PO TABS
1.0000 | ORAL_TABLET | Freq: Every day | ORAL | Status: DC
  Administered 2024-08-26 – 2024-08-27 (×2): 1
  Filled 2024-08-26 (×3): qty 1

## 2024-08-26 MED ORDER — HEPARIN SODIUM (PORCINE) 1000 UNIT/ML IJ SOLN
3000.0000 [IU] | Freq: Once | INTRAMUSCULAR | Status: AC
  Administered 2024-08-26: 14:00:00 3000 [IU] via INTRAVENOUS

## 2024-08-26 MED ORDER — LIDOCAINE HCL (PF) 1 % IJ SOLN: 5.0000 mL | INTRAMUSCULAR | Status: DC | PRN

## 2024-08-26 MED ORDER — ALBUMIN HUMAN 25 % IV SOLN: 25.0000 g | INTRAVENOUS | Status: DC | PRN

## 2024-08-26 MED ORDER — PENTAFLUOROPROP-TETRAFLUOROETH EX AERO: 1.0000 | INHALATION_SPRAY | CUTANEOUS | Status: DC | PRN

## 2024-08-26 NOTE — Progress Notes (Signed)
 Pt receives out-pt HD at [facility] on TTS 11:40 am chair time. Will assist as needed.   Randine Mungo Dialysis Navigator [phone]

## 2024-08-26 NOTE — Progress Notes (Signed)
 Echocardiogram 2D Echocardiogram has been performed.  [name] 08/26/2024, 9:53 AM

## 2024-08-26 NOTE — Progress Notes (Signed)
°   08/26/24 0750  Daily Weaning Assessment  Daily Assessment of Readiness to Wean Wean protocol criteria met (SBT performed)  SBT Method CPAP 5 cm H20 and PS 5 cm H20 (8/5)  Weaning Start Time  (FOUND ON)  Patient response Passed (Tolerated well)

## 2024-08-26 NOTE — Progress Notes (Signed)
 PHARMACY - ANTICOAGULATION CONSULT NOTE  Pharmacy Consult for Heparin  (Eliquis  on hold) Indication: atrial fibrillation  Allergies[1]  Patient Measurements: Height: 6' 2 (188 cm) Weight: 115.2 kg (253 lb 15.5 oz) IBW/kg (Calculated) : 82.2 HEPARIN  DW (KG): 106.5  Vital Signs: Temp: 99.3 F (37.4 C) (12/17 1100) Temp Source: Bladder (12/17 0800) BP: 110/56 (12/17 1100) Pulse Rate: 86 (12/17 1100)  Labs: Recent Labs    08/25/24 1415 08/25/24 1421 08/25/24 1538 08/25/24 2032 08/26/24 1017  HGB 11.5* 12.6* 12.9*  --   --   HCT 38.0* 37.0* 38.0*  --   --   PLT 116*  --   --   --   --   APTT  --   --   --  31  --   LABPROT  --   --   --  16.9*  --   INR  --   --   --  1.3*  --   HEPARINUNFRC  --   --   --  <0.10* <0.10*  CREATININE 6.63*  --  7.30* 7.16*  --     Estimated Creatinine Clearance: 13 mL/min (A) (by C-G formula based on SCr of 7.16 mg/dL (H)).   Medical History: Past Medical History:  Diagnosis Date   Carotid artery occlusion 11/10/2010   LEFT CAROTID ENDARTERECTOMY   Complication of anesthesia    BP WENT UP AT [facility]    COPD (chronic obstructive pulmonary disease) (HCC)    pt denies this dx as of 06/01/20 - no inhaler    Diabetes mellitus without complication (HCC)    Diverticulitis    Diverticulosis of colon (without mention of hemorrhage)    DJD (degenerative joint disease)    knees/hands/feet/back/neck   ESRD (end stage renal disease) on dialysis (HCC)    Fatty liver    Full dentures    GERD (gastroesophageal reflux disease)    H/O hiatal hernia    History of blood transfusion    with a past surical procedure per patient 06/01/20   History of right below knee amputation (HCC) 07/12/2016   Hyperlipidemia    Hypertension    Neuromuscular disorder (HCC)    peripheral neuropathy   Non-pressure chronic ulcer of other part of left foot limited to breakdown of skin (HCC) 11/12/2016   Nonrheumatic aortic (valve) stenosis 03/18/2024   Limited TTE  03/18/2024: EF 55-60, moderate LVH, normal RVSF, moderate LAE, mild MR, mild-moderate AS (mean 12, V-max 235 cm/s, DI 0.35), aortic root 39 mm, moderate pleural effusion, trivial pericardial effusion    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 Atherectomy left popliteal artery July 2012.    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   Unstable angina (HCC) 09/16/2018   Wears glasses     Medications:  Scheduled:   Chlorhexidine  Gluconate Cloth  6 each Topical Daily   Chlorhexidine  Gluconate Cloth  6 each Topical Q0600   [START ON 08/27/2024] doxercalciferol   2 mcg Intravenous Q T,Th,Sa-HD   fentaNYL  (SUBLIMAZE ) injection  25-50 mcg Intravenous Once   insulin  aspart  0-6 Units Subcutaneous Q4H   mupirocin  ointment  1 Application Nasal BID  mouth rinse  15 mL Mouth Rinse Q2H   pantoprazole  (PROTONIX ) IV  40 mg Intravenous QHS   vancomycin  variable dose per unstable renal function (pharmacist dosing)   Does not apply See admin instructions   Infusions:   ceFEPime  (MAXIPIME ) IV Stopped (08/25/24 2214)   fentaNYL  infusion INTRAVENOUS Stopped (08/25/24 2147)   heparin  1,200 Units/hr (08/26/24 1100)   norepinephrine  (LEVOPHED ) Adult infusion Stopped (08/26/24 0640)   propofol  (DIPRIVAN ) infusion Stopped (08/26/24 0522)   PRN: fentaNYL , mouth rinse, perflutren  lipid microspheres (DEFINITY ) IV suspension, polyethylene glycol  Assessment: 68 yo male on chronic eliquis  for hx afib. Pharmacy consulted to dose IV heparin  while intubated/NPO. Last dose of eliquis  not documented, but none administered inpatient, so likely >12hrs ago PTA.  Initial heparin  level undetectable @ < 0.10 at 20:32, heparin   started at 23:57 indicating Eliquis  out of patient's system. No fill data for Eliquis  since 10/24 for a 7 day supply. No issues noted with infusion. HgB 12.9 and PLTs 116 from CBC yesterday. CBC ordered for this AM but has not resulted. No issues with infusion per RN.   Goal of Therapy:  Heparin  level 0.3-0.7 units/ml aPTT 66-102 seconds Monitor platelets by anticoagulation protocol: Yes   Plan:  No bolus with thrombocytopenia.  Increase Heparin  1400 units/hr Will use heparin  levels given baseline undetectable, question adherence to Eliquis .  Continue to monitor H&H and platelets  Powell Blush, PharmD, BCCCP  08/26/2024,11:26 AM  Please check AMION for all [facility] Pharmacy phone numbers After 10:00 PM, call Main Pharmacy [phone]        [1]  Allergies Allergen Reactions   Contrast Media [Iodinated Contrast Media] Shortness Of Breath and Other (See Comments)    Altered mental status     Ivp Dye [Iodinated Contrast Media] Anaphylaxis, Shortness Of Breath and Other (See Comments)    Altered mental status     Latex Rash    A severe rash appears after the first 24 hours of being placed   Tape Rash and Other (See Comments)    Rash after 1 day of use  Do not leave on longer then 3 days without changing

## 2024-08-26 NOTE — Progress Notes (Signed)
 NAME:  [name], MRN:  [id number], DOB:  September 01, 1956, LOS: 1 ADMISSION DATE:  08/25/2024, CHIEF COMPLAINT: Altered mental status  History of Present Illness:  [name] is a 68 year old male who resides at [facility], with past medical history significant for CAD s/p stents on Plavix , NSTEMI s/p in-stent restenosis to RCA; severe stenosis in Lcx s/p DES 11/24/23, ESRD on HD TTS, PAD s/p bilateral BKS, COPD, DM2, HTN, Afib on Eliquis  who presented to the ED after being found unresponsive during dialysis earlier today. Patient was reportedly one hour into dialysis when he was found unresponsive. In the ED, he remained unresponsive with labored breathing and pinpoint pupils. A fentanyl  patch was found applied to his chest and he was given 2mg  Narcan  for which he then aroused. Of note, varying reports of 1 vs 2 fentanyl  patch(es) found on patient, however cannot confirm at this time. Patient reportedly was then in Afib RVR, started on a Diltiazem  gtt, he remained encephalopathic despite additional Narcan  and was subsequently intubated. Nephrology was consulted with plans to resume dialysis 12/17. PCCM consulted for ICU admission.   On exam in the ED, patient hypotensive with manual BP 70s/50s, severe volume overload, will start Levo. Labs notable for elevated troponin/pro-BNP and severe metabolic derangements c/w ESRD. Work-up for metabolic, infectious, cardiac etiology ongoing.   Wife at bedside, initially had made patient DNR-L to the ED provider over the phone, however now would like to make patient Full Code and states he has expressed wanting full resuscitation should he cardiac arrest. Wife is understanding of his significant co-morbidities and overall poor health and would likely consider stopping prolonged resuscitative efforts if outcome appears futile.     Pertinent  Medical History   Past Medical History:  Diagnosis Date   Carotid artery occlusion 11/10/2010   LEFT CAROTID  ENDARTERECTOMY   Complication of anesthesia    BP WENT UP AT [facility]    COPD (chronic obstructive pulmonary disease) (HCC)    pt denies this dx as of 06/01/20 - no inhaler    Diabetes mellitus without complication (HCC)    Diverticulitis    Diverticulosis of colon (without mention of hemorrhage)    DJD (degenerative joint disease)    knees/hands/feet/back/neck   ESRD (end stage renal disease) on dialysis (HCC)    Fatty liver    Full dentures    GERD (gastroesophageal reflux disease)    H/O hiatal hernia    History of blood transfusion    with a past surical procedure per patient 06/01/20   History of right below knee amputation (HCC) 07/12/2016   Hyperlipidemia    Hypertension    Neuromuscular disorder (HCC)    peripheral neuropathy   Non-pressure chronic ulcer of other part of left foot limited to breakdown of skin (HCC) 11/12/2016   Nonrheumatic aortic (valve) stenosis 03/18/2024   Limited TTE 03/18/2024: EF 55-60, moderate LVH, normal RVSF, moderate LAE, mild MR, mild-moderate AS (mean 12, V-max 235 cm/s, DI 0.35), aortic root 39 mm, moderate pleural effusion, trivial pericardial effusion    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 Atherectomy left popliteal artery July 2012.    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  Interim History / Subjective:  Patient is sedated not waking up or following commands.  He  is tolerating pressure support ventilation.  Objective    Blood pressure 109/63, pulse 83, temperature 99 F (37.2 C), resp. rate 20, height 6' 2 (1.88 m), weight 115.2 kg, SpO2 100%.    Vent Mode: PSV;CPAP FiO2 (%):  [40 %-100 %] 40 % Set Rate:  [18 bmp] 18 bmp Vt Set:  [500 mL] 500 mL PEEP:  [5 cmH20] 5 cmH20 Pressure Support:  [8 cmH20] 8 cmH20 Plateau Pressure:  [12 cmH20-20 cmH20] 15 cmH20   Intake/Output Summary (Last 24 hours) at 08/26/2024 9060 Last data filed at 08/26/2024 0800 Gross per 24 hour  Intake 962.98 ml  Output 130 ml  Net 832.98 ml   Filed Weights   08/25/24 2100 08/25/24 2200 08/26/24 9297  Weight: 127.3 kg 115.2 kg 115.2 kg    Examination:   Physical exam: General: Crtitically ill-appearing male, orally intubated HEENT: [place]/AT, eyes anicteric.  ETT and cortrak in place Neuro: Sedated, not following commands.  Eyes are closed.  Pupils 3 mm bilateral reactive to light Chest: Coarse breath sounds, no wheezes or rhonchi Heart: Regular rate and rhythm, no murmurs or gallops Abdomen: Soft, nondistended, bowel sounds present Extremities: Bilateral BKAs.   Resolved problem list   Assessment and Plan   Acute toxic-metabolic encephalopathy likely secondary to opiate intoxication with underlying ESRD ESRD on HD TTS-wife reports hx of non-compliance Restless Leg Syndrome -Patient found to be unresponsive w/ 1 vs 2 transdermal 50mg  Fentanyl  patch(es) applied; aroused after receiving 2mg  Narcan . Unable to confirm at this time exact number of fentanyl  patches. -Nephrology following; plan for HD today  -Trend CMP -Replace electrolytes as indicated -Hold home sevelemer while NPO -Hold requip ; restart once extubated -CTH no acute intracranial abnormality   PAD in setting of mechanical intubation Chronic pain; prescribed 50mg  Fentanyl  patch q72 hours; per wife on med for 15+ years; also on Percocet  7.5-325 mg q8 PRN - Wean sedation -Optimize MMPR -Hold  home Gabapentin  w/ acute encephalopathy; reassess with neurological improvement -SAT daily   Acute hypoxic respiratory failure likely secondary to opioid-induced respiratory depression and volume overload in setting of ESRD C/f pneumonia; recently treated for CAP 08/11/24 COPD OSA -Intubated in ED -LPV; Wean ventilator to maintain SpO2 >90% -CAP coverage with cefepime /vanc; stop vanc if MRSA nares negative -VAP bundle -Bronchopulmonary hygiene; duonebs PRN -ABG/ CXR PRN -SBT daily -Will attempt to extubate today   Chronic Afib with acute onset RVR; on Eliquis ; follows cards outpt- prev on Amio stopped d/t tachy-brady syndrome req TPM-poor PPM candidate; CAD s/p stents (2016); with elevated troponin; on Plavix  NSTEMI 11/24/23 s/p in-stent restenosis to RCA; severe stenosis in Lcx s/p DES HFpEF-TTE 02/17/24 EF 50-55% Pericardial effusion s/p pericardiocentesis 02/2024 Hypertension PAD s/p bilateral BKA Post-intubation/medication-related hypotension HLD Elevated troponin likely type II myocardial infarction with demand ischemia -Initially started on diltiazem  gtt for afib RVR; became hypotensive; gtt stopped; rate has since been controlled -Wean vasopressors to maintain MAP >65 & SBP >90 -Heparin  GTT while intubated will start home dose Eliquis  when extubated -Troponin 183; pBNP >35k -Transthoracic echo pending -Restart Lipitor  -  Hold home antihypertensives   T2DM -A1c 6.4 (08/12/24) -CBG q4 +SSI -Hold home insulin    GI ppx: Famotidine    FEN -No mIVF in setting of volume overload -NPO; OGT to liws; start TF if unable to extubate in morning  Full Code On Heparin  gtt No family around. Will call the spouse later to update.  Labs   CBC: Recent Labs  Lab 08/25/24 1415 08/25/24 1421 08/25/24 1538  WBC 6.3  --   --   NEUTROABS 3.8  --   --   HGB 11.5* 12.6* 12.9*  HCT 38.0* 37.0* 38.0*  MCV 89.8  --   --   PLT 116*  --   --     Basic Metabolic Panel: Recent Labs  Lab  08/25/24 1415 08/25/24 1421 08/25/24 1538 08/25/24 2032  NA 137 135 136 134*  K 5.4* 5.1 5.2* 5.6*  CL 98  --  100 95*  CO2 23  --   --  21*  GLUCOSE 125*  --  121* 138*  BUN 53*  --  60* 58*  CREATININE 6.63*  --  7.30* 7.16*  CALCIUM  7.9*  --   --  8.2*  MG  --   --   --  1.9  PHOS  --   --   --  7.1*   GFR: Estimated Creatinine Clearance: 13 mL/min (A) (by C-G formula based on SCr of 7.16 mg/dL (H)). Recent Labs  Lab 08/25/24 1415 08/25/24 2040  WBC 6.3  --   LATICACIDVEN  --  1.5    Liver Function Tests: Recent Labs  Lab 08/25/24 2032  AST 38  ALT 19  ALKPHOS 135*  BILITOT 0.5  PROT 6.4*  ALBUMIN  3.7   No results for input(s): LIPASE, AMYLASE in the last 168 hours. No results for input(s): AMMONIA in the last 168 hours.  ABG    Component Value Date/Time   PHART 7.364 02/13/2024 1550   PCO2ART 36.7 02/13/2024 1550   PO2ART 112 (H) 02/13/2024 1550   HCO3 25.7 08/25/2024 1421   TCO2 23 08/25/2024 1538   ACIDBASEDEF 4.0 (H) 02/13/2024 1550   O2SAT 78 08/25/2024 1421     Coagulation Profile: Recent Labs  Lab 08/25/24 2032  INR 1.3*    Cardiac Enzymes: No results for input(s): CKTOTAL, CKMB, CKMBINDEX, TROPONINI in the last 168 hours.  HbA1C: Hgb A1c MFr Bld  Date/Time Value Ref Range Status  08/12/2024 02:14 AM 6.4 (H) 4.8 - 5.6 % Final    Comment:    (NOTE)         Prediabetes: 5.7 - 6.4         Diabetes: >6.4         Glycemic control for adults with diabetes: <7.0   02/01/2024 11:19 AM 5.4 4.8 - 5.6 % Final    Comment:    (NOTE) Pre diabetes:          5.7%-6.4%  Diabetes:              >6.4%  Glycemic control for   <7.0% adults with diabetes     CBG: Recent Labs  Lab 08/25/24 2008 08/25/24 2048 08/25/24 2318 08/26/24 0312 08/26/24 0734  GLUCAP 121* 143* 150* 111* 81    Review of Systems:   12 point review system could not be done as the patient is sedated.  Past Medical History:  He,  has a past medical  history of Carotid artery occlusion (11/10/2010), Complication of anesthesia, COPD (chronic obstructive pulmonary  disease) (HCC), Diabetes mellitus without complication (HCC), Diverticulitis, Diverticulosis of colon (without mention of hemorrhage), DJD (degenerative joint disease), ESRD (end stage renal disease) on dialysis (HCC), Fatty liver, Full dentures, GERD (gastroesophageal reflux disease), H/O hiatal hernia, History of blood transfusion, History of right below knee amputation (HCC) (07/12/2016), Hyperlipidemia, Hypertension, Neuromuscular disorder (HCC), Non-pressure chronic ulcer of other part of left foot limited to breakdown of skin (HCC) (11/12/2016), Nonrheumatic aortic (valve) stenosis (03/18/2024), Osteomyelitis (HCC), PAD (peripheral artery disease), Pseudoclaudication (11/15/2018), S/P BKA (below knee amputation), right (HCC) (09/18/2018), Sleep apnea, Slurred speech, Status post reversal of ileostomy (05/21/2018), 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   Surgical History:   Past Surgical History:  Procedure Laterality Date   ABDOMINAL AORTOGRAM W/LOWER EXTREMITY N/A 06/23/2021   Procedure: ABDOMINAL AORTOGRAM W/LOWER EXTREMITY;  Surgeon: Elmira Newman PARAS, MD;  Location: [facility];  Service: Cardiovascular;  Laterality: N/A;   AMPUTATION  11/05/2011   Procedure: AMPUTATION RAY;  Surgeon: Norleen Armor, MD;  Location: [facility] OR;  Service: Orthopedics;  Laterality: Right;  Amputation of Right 4&5th Toes   AMPUTATION Left 11/26/2012   Procedure: AMPUTATION RAY;  Surgeon: Norleen Armor, MD;  Location: [facility] OR;  Service: Orthopedics;  Laterality: Left;  fourth ray amputation   AMPUTATION Right 08/27/2014   Procedure: Transmetatarsal Amputation;  Surgeon: Jerona Harden GAILS, MD;  Location[facility] OR;  Service: Orthopedics;  Laterality: Right;   AMPUTATION Right 01/14/2015   Procedure: AMPUTATION BELOW KNEE;  Surgeon: Jerona Harden GAILS, MD;  Location: [facility]  OR;  Service: Orthopedics;  Laterality: Right;   AMPUTATION Left 10/21/2015   Procedure: Left Foot 5th Ray Amputation;  Surgeon: Jerona Harden GAILS, MD;  Location: [facility] OR;  Service: Orthopedics;  Laterality: Left;   AMPUTATION Left 07/04/2022   Procedure: LEFT BELOW KNEE AMPUTATION;  Surgeon: Harden Jerona GAILS, MD;  Location: [facility] OR;  Service: Orthopedics;  Laterality: Left;   ANTERIOR FUSION CERVICAL SPINE  02/06/06   C4-5, C5-6, C6-7; SURGEON DR. MAX COHEN   AV FISTULA PLACEMENT Left 06/02/2020   Procedure: ARTERIOVENOUS (AV) FISTULA CREATION LEFT;  Surgeon: Sheree Penne Bruckner, MD;  Location: [facility] OR;  Service: Vascular;  Laterality: Left;   BACK SURGERY     x 3   BASCILIC VEIN TRANSPOSITION Left 07/21/2020   Procedure: LEFT UPPER ARM ATERIOVENOUS SUPERFISTULALIZATION;  Surgeon: Sheree Penne Bruckner, MD;  Location: [facility] OR;  Service: Vascular;  Laterality: Left;   BELOW KNEE LEG AMPUTATION Right    CARDIAC CATHETERIZATION  10/31/04   2009   CAROTID ENDARTERECTOMY  11/10/10   CAROTID ENDARTERECTOMY Left 11/10/2010   Subtotal occlusion of left internal carotid artery with left hemispheric transient ischemic attacks.   CAROTID STENT     CARPAL TUNNEL RELEASE Right 10/21/2013   Procedure: RIGHT CARPAL TUNNEL RELEASE;  Surgeon: Arley JONELLE Curia, MD;  Location: [facility];  Service: Orthopedics;  Laterality: Right;   CHOLECYSTECTOMY     COLON SURGERY     COLONOSCOPY     COLOSTOMY REVERSAL  05/21/2018   ileostomy reversal   CORONARY LITHOTRIPSY N/A 11/24/2023   Procedure: CORONARY LITHOTRIPSY;  Surgeon: Jordan, Peter M, MD;  Location: [facility] INVASIVE CV LAB;  Service: Cardiovascular;  Laterality: N/A;   CORONARY STENT INTERVENTION N/A 11/24/2023   Procedure: CORONARY STENT INTERVENTION;  Surgeon: Jordan, Peter M, MD;  Location: [facility] INVASIVE CV LAB;  Service: Cardiovascular;  Laterality: N/A;   CYSTOSCOPY WITH STENT PLACEMENT Bilateral 01/13/2018   Procedure: CYSTOSCOPY WITH BILATERAL  URETERAL  CATHETER PLACEMENT;  Surgeon: Cam Morene ORN, MD;  Location: [facility] ORS;  Service: Urology;  Laterality: Bilateral;   ESOPHAGEAL MANOMETRY Bilateral 07/19/2014   Procedure: ESOPHAGEAL MANOMETRY (EM);  Surgeon: Gordy CHRISTELLA Starch, MD;  Location: [facility]OSCOPY;  Service: Gastroenterology;  Laterality: Bilateral;   EYE SURGERY Bilateral 2020   cataract   FEMORAL ARTERY STENT     x6   FINGER SURGERY     FOOT SURGERY  04/25/2016    EXCISION BASE 5TH METATARSAL AND PARTIAL CUBOID LEFT FOOT   HERNIA REPAIR     LEFT INGUINAL AND UMBILICAL REPAIRS   HERNIA REPAIR     I & D EXTREMITY Left 04/25/2016   Procedure: EXCISION BASE 5TH METATARSAL AND PARTIAL CUBOID LEFT FOOT;  Surgeon: Jerona LULLA Sage, MD;  Location: [facility] OR;  Service: Orthopedics;  Laterality: Left;   ILEOSTOMY  01/13/2018   Procedure: ILEOSTOMY;  Surgeon: Signe Mitzie LABOR, MD;  Location: [facility] ORS;  Service: General;;   ILEOSTOMY CLOSURE N/A 05/21/2018   Procedure: ILEOSTOMY REVERSAL ERAS PATHWAY;  Surgeon: Signe Mitzie LABOR, MD;  Location: [facility] OR;  Service: General;  Laterality: N/A;   IR RADIOLOGIST EVAL & MGMT  11/19/2017   IR RADIOLOGIST EVAL & MGMT  12/03/2017   IR RADIOLOGIST EVAL & MGMT  12/18/2017   JOINT REPLACEMENT Right 2001   Total knee   LAMINECTOMY     X 3 LUMBAR AND X 2 CERVICAL SPINE OPERATIONS   LAPAROSCOPIC CHOLECYSTECTOMY W/ CHOLANGIOGRAPHY  11/09/04   SURGEON DR. DEWARD CANDIE NULL   LEFT HEART CATH AND CORONARY ANGIOGRAPHY N/A 09/16/2018   Procedure: LEFT HEART CATH AND CORONARY ANGIOGRAPHY;  Surgeon: Elmira Newman PARAS, MD;  [facility];  Service: Cardiovascular;  Laterality: N/A;   LEFT HEART CATH AND CORONARY ANGIOGRAPHY N/A 11/24/2023   Procedure: LEFT HEART CATH AND CORONARY ANGIOGRAPHY;  Surgeon: Jordan, Peter M, MD;  Location: [facility] INVASIVE CV LAB;  Service: Cardiovascular;  Laterality: N/A;   LEFT HEART CATHETERIZATION WITH CORONARY ANGIOGRAM N/A 10/29/2014   Procedure: LEFT HEART CATHETERIZATION WITH CORONARY  ANGIOGRAM;  Surgeon: Erick JONELLE Bergamo, MD;  Location: [facility] CATH LAB;  Service: Cardiovascular;  Laterality: N/A;   LIGATION OF COMPETING BRANCHES OF ARTERIOVENOUS FISTULA Left 07/21/2020   Procedure: LIGATION OF COMPETING BRANCHES OF LEFT UPPER ARM ARTERIOVENOUS FISTULA;  Surgeon: Sheree Penne Bruckner, MD;  Location: [facility] OR;  Service: Vascular;  Laterality: Left;   LOWER EXTREMITY ANGIOGRAM N/A 03/19/2012   Procedure: LOWER EXTREMITY ANGIOGRAM;  Surgeon: Bruckner JONETTA Cash, MD;  Location: [facility] CATH LAB;  Service: Cardiovascular;  Laterality: N/A;   LOWER EXTREMITY ANGIOGRAPHY N/A 06/20/2021   Procedure: LOWER EXTREMITY ANGIOGRAPHY;  Surgeon: Elmira Newman PARAS, MD;  [facility];  Service: Cardiovascular;  Laterality: N/A;   NECK SURGERY     PARTIAL COLECTOMY N/A 01/13/2018   Procedure: LAPAROSCOPIC ASSISTED   SIGMOID COLECTOMY ILEOSTOMY;  Surgeon: Signe Mitzie LABOR, MD;  Location: [facility] ORS;  Service: General;  Laterality: N/A;   PENILE PROSTHESIS IMPLANT  08/14/05   INFRAPUBIC INSERTION OF INFLATABLE PENILE PROSTHESIS; SURGEON DR. JANIT   PENILE PROSTHESIS IMPLANT     PERCUTANEOUS CORONARY STENT INTERVENTION (PCI-S) Right 10/29/2014   Procedure: PERCUTANEOUS CORONARY STENT INTERVENTION (PCI-S);  Surgeon: Erick JONELLE Bergamo, MD;  Location: [facility] CATH LAB;  Service: Cardiovascular;  Laterality: Right;   PERICARDIOCENTESIS N/A 02/13/2024   Procedure: PERICARDIOCENTESIS;  Surgeon: Elmira Newman PARAS, MD;  [facility];  Service: Cardiovascular;  Laterality:  N/A;   PERIPHERAL VASCULAR INTERVENTION Left 06/23/2021   Procedure: PERIPHERAL VASCULAR INTERVENTION;  Surgeon: Elmira Newman PARAS, MD;  [facility]: [facility];  Service: Cardiovascular;  Laterality: Left;   REMOVAL OF PENILE PROSTHESIS N/A 06/14/2021   Procedure: Removal of THREE piece inflatable penile prosthesis;  Surgeon: Carolee Sherwood JONETTA DOUGLAS, MD;  Location: [facility] OR;  Service: Urology;  Laterality: N/A;    SHOULDER ARTHROSCOPY     SPINE SURGERY     TEMPORARY PACEMAKER N/A 02/13/2024   Procedure: TEMPORARY PACEMAKER;  Surgeon: Elmira Newman PARAS, MD;  [facility]: [facility];  Service: Cardiovascular;  Laterality: N/A;   TOE AMPUTATION Left    TONSILLECTOMY     TOTAL KNEE ARTHROPLASTY  07/2002   RIGHT KNEE ; SURGEON  DR. [name] ALSO HAD ARTHROSCOPIC RIGHT KNEE IN  10/2001   TOTAL KNEE ARTHROPLASTY     ULNAR NERVE TRANSPOSITION Right 10/21/2013   Procedure: RIGHT ELBOW  ULNAR NERVE DECOMPRESSION;  Surgeon: Arley JONELLE Curia, MD;  Location: [facility];  Service: Orthopedics;  Laterality: Right;     Social History:   reports that he has been smoking cigarettes. He has a 35 pack-year smoking history. He has never used smokeless tobacco. He reports that he does not currently use alcohol . He reports that he does not use drugs.   Family History:  His family history includes Arthritis in an other family member; Cancer in his father and sister; Colon cancer in his brother; Diabetes in his brother and father; Heart attack in his brother and father; Heart disease in his father; Heart disease (age of onset: 39) in his brother; Hyperlipidemia in his brother, father, and sister; Hypertension in his brother, father, sister, son, and another family member; Varicose Veins in his father.   Allergies Allergies[1]   Home Medications  Prior to Admission medications  Medication Sig Start Date End Date Taking? Authorizing Provider  amLODipine  (NORVASC ) 10 MG tablet Take 5 mg by mouth daily.    [provider]  apixaban  (ELIQUIS ) 5 MG TABS tablet Take 1 tablet (5 mg total) by mouth 2 (two) times daily. 01/14/22 09/09/24  Gonfa, Taye T, MD  atorvastatin  (LIPITOR ) 80 MG tablet Take 1 tablet (80 mg total) by mouth at bedtime. 11/25/23   Amoako, Prince, MD  B Complex-C-Zn-Folic Acid  (DIALYVITE /ZINC ) TABS Take 1 tablet by mouth See admin instructions. Give 1 tablet by mouth 3 days a week prior to  dialysis on Tuesday, Thursday, Saturday. 10/04/21   [provider]  clopidogrel  (PLAVIX ) 75 MG tablet Take 1 tablet (75 mg total) by mouth daily with breakfast. Patient taking differently: Take 75 mg by mouth daily. 11/25/23 11/24/24  Amoako, Prince, MD  colchicine  0.6 MG tablet Take 0.5 tablets (0.3 mg total) by mouth daily. 02/21/24   Gonfa, Taye T, MD  famotidine  (PEPCID ) 20 MG tablet Take 20 mg by mouth See admin instructions. Give 1 tablet (20mg ) by mouth every other morning.    [provider]  fentaNYL  (DURAGESIC ) 50 MCG/HR Place 1 patch onto the skin every 3 (three) days. 08/12/24   Raenelle Coria, MD  folic acid  (FOLVITE ) 1 MG tablet Take 1 mg by mouth daily. 09/18/21   [provider]  gabapentin  (NEURONTIN ) 100 MG capsule Take 100 mg by mouth 3 (three) times daily.    [provider]  insulin  aspart (NOVOLOG  FLEXPEN) 100 UNIT/ML FlexPen Inject 4-16 Units into the skin See admin instructions. Inject 4 units subcutaneously three times daily plus  0-12 units per sliding scale: 70 - 150 : 0 units 151-200 : 2 units 201-250 : 4 units 251-300 : 6 units 301-350 : 8 units 351-400 : 10 units 401-450 : 12 units > 400 : give 12 units and recheck in 1 hour - if still > 400, call MD/NP for further orders.    [provider]  ipratropium-albuterol  (DUONEB) 0.5-2.5 (3) MG/3ML SOLN  05/26/24   [provider]  isosorbide  mononitrate (IMDUR ) 60 MG 24 hr tablet Take 60 mg by mouth daily. Give 60mg  by mouth one time a day    [provider]  lidocaine -prilocaine  (EMLA ) cream Apply 1 application  topically See admin instructions. Apply topically 3 days a week prior to dialysis on Tuesday, Thursday, Saturday. 06/06/21   [provider]  loratadine  (CLARITIN ) 10 MG tablet Take 10 mg by mouth daily.    [provider]  mineral oil-hydrophilic petrolatum (AQUAPHOR) ointment Apply 1 Application topically every 8 (eight) hours as needed for  irritation or dry skin.    [provider]  nitroGLYCERIN  (NITROSTAT ) 0.4 MG SL tablet Place 0.4 mg under the tongue every 5 (five) minutes as needed for chest pain. 01/06/22   [provider]  oxyCODONE -acetaminophen  (PERCOCET ) 7.5-325 MG tablet Take 1 tablet by mouth every 8 (eight) hours as needed for severe pain (pain score 7-10). 08/12/24   Raenelle Coria, MD  promethazine  (PHENERGAN ) 12.5 MG suppository Place 12.5 mg rectally daily as needed for nausea or vomiting.    [provider]  rOPINIRole  (REQUIP ) 2 MG tablet Take 2 mg by mouth at bedtime.    [provider]  sevelamer  carbonate (RENVELA ) 800 MG tablet Take 2 tablets (1,600 mg total) by mouth 3 (three) times daily with meals. 02/04/24   Amin, Sumayya, MD  tamsulosin  (FLOMAX ) 0.4 MG CAPS capsule Take 0.4 mg by mouth daily.    [provider]     Critical care time: 45 Minutes.     Tamela Stakes, MD  Attending Physician, Critical Care Medicine [facility] See Amion for pager If no response to pager, please call [phone] until 7pm After 7pm, Please call E-link [phone]          [1]  Allergies Allergen Reactions   Contrast Media [Iodinated Contrast Media] Shortness Of Breath and Other (See Comments)    Altered mental status     Ivp Dye [Iodinated Contrast Media] Anaphylaxis, Shortness Of Breath and Other (See Comments)    Altered mental status     Latex Rash    A severe rash appears after the first 24 hours of being placed   Tape Rash and Other (See Comments)    Rash after 1 day of use  Do not leave on longer then 3 days without changing

## 2024-08-26 NOTE — Progress Notes (Signed)
 PHARMACY - ANTICOAGULATION CONSULT NOTE  Pharmacy Consult for Heparin  (Eliquis  on hold) Indication: atrial fibrillation  Allergies[1]  Patient Measurements: Height: 6' 2 (188 cm) Weight: 120.3 kg (265 lb 3.4 oz) IBW/kg (Calculated) : 82.2 HEPARIN  DW (KG): 106.5  Vital Signs: Temp: 99.9 F (37.7 C) (12/17 2000) Temp Source: Bladder (12/17 2000) BP: 126/63 (12/17 2000) Pulse Rate: 99 (12/17 2000)  Labs: Recent Labs    08/25/24 1415 08/25/24 1421 08/25/24 1538 08/25/24 2032 08/26/24 1017 08/26/24 1301 08/26/24 2128  HGB 11.5* 12.6* 12.9*  --   --  11.9*  --   HCT 38.0* 37.0* 38.0*  --   --  37.4*  --   PLT 116*  --   --   --   --  152  --   APTT  --   --   --  31  --  35  --   LABPROT  --   --   --  16.9*  --   --   --   INR  --   --   --  1.3*  --   --   --   HEPARINUNFRC  --   --   --  <0.10* <0.10*  --  <0.10*  CREATININE 6.63*  --  7.30* 7.16*  --  7.96*  --     Estimated Creatinine Clearance: 11.9 mL/min (A) (by C-G formula based on SCr of 7.96 mg/dL (H)).   Medical History: Past Medical History:  Diagnosis Date   Carotid artery occlusion 11/10/2010   LEFT CAROTID ENDARTERECTOMY   Complication of anesthesia    BP WENT UP AT [facility]    COPD (chronic obstructive pulmonary disease) (HCC)    pt denies this dx as of 06/01/20 - no inhaler    Diabetes mellitus without complication (HCC)    Diverticulitis    Diverticulosis of colon (without mention of hemorrhage)    DJD (degenerative joint disease)    knees/hands/feet/back/neck   ESRD (end stage renal disease) on dialysis (HCC)    Fatty liver    Full dentures    GERD (gastroesophageal reflux disease)    H/O hiatal hernia    History of blood transfusion    with a past surical procedure per patient 06/01/20   History of right below knee amputation (HCC) 07/12/2016   Hyperlipidemia    Hypertension    Neuromuscular disorder (HCC)    peripheral neuropathy   Non-pressure chronic ulcer of other part of left foot  limited to breakdown of skin (HCC) 11/12/2016   Nonrheumatic aortic (valve) stenosis 03/18/2024   Limited TTE 03/18/2024: EF 55-60, moderate LVH, normal RVSF, moderate LAE, mild MR, mild-moderate AS (mean 12, V-max 235 cm/s, DI 0.35), aortic root 39 mm, moderate pleural effusion, trivial pericardial effusion    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 Atherectomy left popliteal artery July 2012.    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   Unstable angina (HCC) 09/16/2018   Wears glasses     Medications:  Scheduled:   Chlorhexidine  Gluconate Cloth  6 each Topical Daily   Chlorhexidine  Gluconate Cloth  6 each Topical Q0600   [  START ON 08/27/2024] doxercalciferol   2 mcg Intravenous Q T,Th,Sa-HD   insulin  aspart  0-6 Units Subcutaneous Q4H   multivitamin  1 tablet Per Tube QHS   mupirocin  ointment  1 Application Nasal BID   mouth rinse  15 mL Mouth Rinse Q2H   pantoprazole  (PROTONIX ) IV  40 mg Intravenous QHS   vancomycin  variable dose per unstable renal function (pharmacist dosing)   Does not apply See admin instructions   Infusions:   albumin  human     anticoagulant sodium citrate      ceFEPime  (MAXIPIME ) IV 1 g (08/26/24 2202)   fentaNYL  infusion INTRAVENOUS Stopped (08/25/24 2147)   heparin  1,400 Units/hr (08/26/24 2100)   norepinephrine  (LEVOPHED ) Adult infusion Stopped (08/26/24 1716)   propofol  (DIPRIVAN ) infusion 20 mcg/kg/min (08/26/24 2100)   PRN: albumin  human, alteplase , anticoagulant sodium citrate , fentaNYL , heparin , lidocaine  (PF), lidocaine -prilocaine , mouth rinse, pentafluoroprop-tetrafluoroeth, polyethylene glycol  Assessment: 68 yo male on chronic eliquis  for hx afib.  Pharmacy consulted to dose IV heparin  while intubated/NPO. Last dose of eliquis  not documented, but none administered inpatient, so likely >12hrs ago PTA.  Initial heparin  level undetectable @ < 0.10 this evening.  No known issues with IV infusion, no overt bleeding or complications noted.  Goal of Therapy:  Heparin  level 0.3-0.7 units/ml aPTT 66-102 seconds Monitor platelets by anticoagulation protocol: Yes   Plan:  No bolus with thrombocytopenia.  Increase Heparin  1650 units/hr Daily heparin  level and CBC. Continue to monitor H&H and platelets  Harlene Barlow, [employer], BCCP Clinical Pharmacist  08/26/2024 10:06 PM   [facility] pharmacy phone numbers are listed on [email or web address]          [1]  Allergies Allergen Reactions   Contrast Media [Iodinated Contrast Media] Shortness Of Breath and Other (See Comments)    Altered mental status     Ivp Dye [Iodinated Contrast Media] Anaphylaxis, Shortness Of Breath and Other (See Comments)    Altered mental status     Latex Rash    severe rash appears after the first 24 hours of being placed   Tape Rash and Other (See Comments)    Rash after 1 day of use  Do not leave on longer then 3 days without changing

## 2024-08-26 NOTE — Consult Note (Signed)
 Renal Service Consult Note [facility] Lamar JONETTA Fret, MD  Patient: [name] Date: 08/26/2024 Requesting Physician: Dr. Gatha, GORMAN.   Reason for Consult: ESRD pt w/  HPI: The patient is a 68 y.o. year-old w/ PMH as below who presented to ED yesterday afternoon sent from dialysis.  Patient was dialyzed for 1 hour and then went staff went to check on him he was minimally responsive.  His prior dialysis was a 4-week ago and the wife said he had 20 kg on.  EMS came and gave DuoNeb, he was 91% on room room air and 95 after nebs.  Was hospitalized 2 weeks ago for pneumonia.  In the ED patient remained unresponsive with labored breathing and pinpoint pupils.  A fentanyl  patch was found applied to his chest and he received Narcan  and had a good response.  Then he went into A-fib with RVR and remained encephalopathic despite additional Narcan  so was subsequently intubated for airway support.  IV Levophed  was started for hypotension with blood pressures in the 70s and significant volume overload was noted.  Per the H&P the wife initially made patient a DNR-L to the ED provider over the phone, however then she changed her mind to make the patient a full code.  Per the ICU note the wife is understanding of his serious comorbid disease and overall poor health and would likely consider stopping prolonged resuscitative efforts if outcome appears futile.   Critical care team admitted to ICU.  We are asked to see for ESRD.   Pt seen in ICU, patient is on the ventilator and sedated, no history was obtained.   ROS - n/a   Past Medical History  Past Medical History:  Diagnosis Date   Carotid artery occlusion 11/10/2010   LEFT CAROTID ENDARTERECTOMY   Complication of anesthesia    BP WENT UP AT [facility]    COPD (chronic obstructive pulmonary disease) (HCC)    pt denies this dx as of 06/01/20 - no inhaler    Diabetes mellitus without complication (HCC)    Diverticulitis    Diverticulosis  of colon (without mention of hemorrhage)    DJD (degenerative joint disease)    knees/hands/feet/back/neck   ESRD (end stage renal disease) on dialysis (HCC)    Fatty liver    Full dentures    GERD (gastroesophageal reflux disease)    H/O hiatal hernia    History of blood transfusion    with a past surical procedure per patient 06/01/20   History of right below knee amputation (HCC) 07/12/2016   Hyperlipidemia    Hypertension    Neuromuscular disorder (HCC)    peripheral neuropathy   Non-pressure chronic ulcer of other part of left foot limited to breakdown of skin (HCC) 11/12/2016   Nonrheumatic aortic (valve) stenosis 03/18/2024   Limited TTE 03/18/2024: EF 55-60, moderate LVH, normal RVSF, moderate LAE, mild MR, mild-moderate AS (mean 12, V-max 235 cm/s, DI 0.35), aortic root 39 mm, moderate pleural effusion, trivial pericardial effusion    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 Atherectomy left popliteal artery July 2012.    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   Unstable angina (HCC) 09/16/2018   Wears glasses    Past Surgical History  Past Surgical History:  Procedure Laterality Date   ABDOMINAL AORTOGRAM W/LOWER EXTREMITY N/A 06/23/2021   Procedure: ABDOMINAL AORTOGRAM W/LOWER EXTREMITY;  Surgeon: Elmira Newman PARAS, MD;  [facility]: [facility];  Service: Cardiovascular;  Laterality: N/A;   AMPUTATION  11/05/2011   Procedure: AMPUTATION RAY;  Surgeon: Norleen Armor, MD;  Location: [facility] OR;  Service: Orthopedics;  Laterality: Right;  Amputation of Right 4&5th Toes   AMPUTATION Left 11/26/2012   Procedure: AMPUTATION RAY;  Surgeon: Norleen Armor, MD;  Location: [facility] OR;  Service:  Orthopedics;  Laterality: Left;  fourth ray amputation   AMPUTATION Right 08/27/2014   Procedure: Transmetatarsal Amputation;  Surgeon: Jerona Harden GAILS, MD;  Location: [facility] OR;  Service: Orthopedics;  Laterality: Right;   AMPUTATION Right 01/14/2015   Procedure: AMPUTATION BELOW KNEE;  Surgeon: Jerona Harden GAILS, MD;  Location: [facility] OR;  Service: Orthopedics;  Laterality: Right;   AMPUTATION Left 10/21/2015   Procedure: Left Foot 5th Ray Amputation;  Surgeon: Jerona Harden GAILS, MD;  Location: [facility] OR;  Service: Orthopedics;  Laterality: Left;   AMPUTATION Left 07/04/2022   Procedure: LEFT BELOW KNEE AMPUTATION;  Surgeon: Harden Jerona GAILS, MD;  Location: [facility] OR;  Service: Orthopedics;  Laterality: Left;   ANTERIOR FUSION CERVICAL SPINE  02/06/06   C4-5, C5-6, C6-7; SURGEON DR. MAX COHEN   AV FISTULA PLACEMENT Left 06/02/2020   Procedure: ARTERIOVENOUS (AV) FISTULA CREATION LEFT;  Surgeon: Sheree Penne Bruckner, MD;  Location: [facility] OR;  Service: Vascular;  Laterality: Left;   BACK SURGERY     x 3   BASCILIC VEIN TRANSPOSITION Left 07/21/2020   Procedure: LEFT UPPER ARM ATERIOVENOUS SUPERFISTULALIZATION;  Surgeon: Sheree Penne Bruckner, MD;  Location: [facility] OR;  Service: Vascular;  Laterality: Left;   BELOW KNEE LEG AMPUTATION Right    CARDIAC CATHETERIZATION  10/31/04   2009   CAROTID ENDARTERECTOMY  11/10/10   CAROTID ENDARTERECTOMY Left 11/10/2010   Subtotal occlusion of left internal carotid artery with left hemispheric transient ischemic attacks.   CAROTID STENT     CARPAL TUNNEL RELEASE Right 10/21/2013   Procedure: RIGHT CARPAL TUNNEL RELEASE;  Surgeon: Arley JONELLE Curia, MD;  Location: [facility];  Service: Orthopedics;  Laterality: Right;   CHOLECYSTECTOMY     COLON SURGERY     COLONOSCOPY     COLOSTOMY REVERSAL  05/21/2018   ileostomy reversal   CORONARY LITHOTRIPSY N/A 11/24/2023   Procedure: CORONARY LITHOTRIPSY;  Surgeon: Jordan, Peter M, MD;  Location: [facility] INVASIVE CV LAB;  Service:  Cardiovascular;  Laterality: N/A;   CORONARY STENT INTERVENTION N/A 11/24/2023   Procedure: CORONARY STENT INTERVENTION;  Surgeon: Jordan, Peter M, MD;  Location: [facility] INVASIVE CV LAB;  Service: Cardiovascular;  Laterality: N/A;   CYSTOSCOPY WITH STENT PLACEMENT Bilateral 01/13/2018   Procedure: CYSTOSCOPY WITH BILATERAL URETERAL CATHETER PLACEMENT;  Surgeon: Cam Morene ORN, MD;  Location: [facility] ORS;  Service: Urology;  Laterality: Bilateral;   ESOPHAGEAL MANOMETRY Bilateral 07/19/2014   Procedure: ESOPHAGEAL MANOMETRY (EM);  Surgeon: Gordy CHRISTELLA Starch, MD;  Location: [facility];  Service: Gastroenterology;  Laterality: Bilateral;   EYE SURGERY Bilateral 2020   cataract   FEMORAL ARTERY STENT     x6   FINGER SURGERY     FOOT SURGERY  04/25/2016    EXCISION BASE 5TH METATARSAL AND PARTIAL CUBOID LEFT FOOT   HERNIA REPAIR  LEFT INGUINAL AND UMBILICAL REPAIRS   HERNIA REPAIR     I & D EXTREMITY Left 04/25/2016   Procedure: EXCISION BASE 5TH METATARSAL AND PARTIAL CUBOID LEFT FOOT;  Surgeon: Jerona LULLA Sage, MD;  Location: [facility] OR;  Service: Orthopedics;  Laterality: Left;   ILEOSTOMY  01/13/2018   Procedure: ILEOSTOMY;  Surgeon: Signe Mitzie LABOR, MD;  Location: [facility] ORS;  Service: General;;   ILEOSTOMY CLOSURE N/A 05/21/2018   Procedure: ILEOSTOMY REVERSAL ERAS PATHWAY;  Surgeon: Signe Mitzie LABOR, MD;  Location: [facility] OR;  Service: General;  Laterality: N/A;   IR RADIOLOGIST EVAL & MGMT  11/19/2017   IR RADIOLOGIST EVAL & MGMT  12/03/2017   IR RADIOLOGIST EVAL & MGMT  12/18/2017   JOINT REPLACEMENT Right 2001   Total knee   LAMINECTOMY     X 3 LUMBAR AND X 2 CERVICAL SPINE OPERATIONS   LAPAROSCOPIC CHOLECYSTECTOMY W/ CHOLANGIOGRAPHY  11/09/04   SURGEON DR. DEWARD CANDIE NULL   LEFT HEART CATH AND CORONARY ANGIOGRAPHY N/A 09/16/2018   Procedure: LEFT HEART CATH AND CORONARY ANGIOGRAPHY;  Surgeon: Elmira Newman PARAS, MD;  [facility];  Service: Cardiovascular;  Laterality: N/A;   LEFT HEART CATH  AND CORONARY ANGIOGRAPHY N/A 11/24/2023   Procedure: LEFT HEART CATH AND CORONARY ANGIOGRAPHY;  Surgeon: Jordan, Peter M, MD;  Location: [facility] INVASIVE CV LAB;  Service: Cardiovascular;  Laterality: N/A;   LEFT HEART CATHETERIZATION WITH CORONARY ANGIOGRAM N/A 10/29/2014   Procedure: LEFT HEART CATHETERIZATION WITH CORONARY ANGIOGRAM;  Surgeon: Erick JONELLE Bergamo, MD;  Location: [facility] CATH LAB;  Service: Cardiovascular;  Laterality: N/A;   LIGATION OF COMPETING BRANCHES OF ARTERIOVENOUS FISTULA Left 07/21/2020   Procedure: LIGATION OF COMPETING BRANCHES OF LEFT UPPER ARM ARTERIOVENOUS FISTULA;  Surgeon: Sheree Penne Bruckner, MD;  Location: [facility] OR;  Service: Vascular;  Laterality: Left;   LOWER EXTREMITY ANGIOGRAM N/A 03/19/2012   Procedure: LOWER EXTREMITY ANGIOGRAM;  Surgeon: Bruckner JONETTA Cash, MD;  Location: [facility] CATH LAB;  Service: Cardiovascular;  Laterality: N/A;   LOWER EXTREMITY ANGIOGRAPHY N/A 06/20/2021   Procedure: LOWER EXTREMITY ANGIOGRAPHY;  Surgeon: Elmira Newman PARAS, MD;  [facility];  Service: Cardiovascular;  Laterality: N/A;   NECK SURGERY     PARTIAL COLECTOMY N/A 01/13/2018   Procedure: LAPAROSCOPIC ASSISTED   SIGMOID COLECTOMY ILEOSTOMY;  Surgeon: Signe Mitzie LABOR, MD;  Location: [facility]S;  Service: General;  Laterality: N/A;   PENILE PROSTHESIS IMPLANT  08/14/05   INFRAPUBIC INSERTION OF INFLATABLE PENILE PROSTHESIS; SURGEON DR. JANIT   PENILE PROSTHESIS IMPLANT     PERCUTANEOUS CORONARY STENT INTERVENTION (PCI-S) Right 10/29/2014   Procedure: PERCUTANEOUS CORONARY STENT INTERVENTION (PCI-S);  Surgeon: Erick JONELLE Bergamo, MD;  Location: [facility] CATH LAB;  Service: Cardiovascular;  Laterality: Right;   PERICARDIOCENTESIS N/A 02/13/2024   Procedure: PERICARDIOCENTESIS;  Surgeon: Elmira Newman PARAS, MD;  [facility]: [facility];  Service: Cardiovascular;  Laterality: N/A;   PERIPHERAL VASCULAR INTERVENTION Left 06/23/2021   Procedure: PERIPHERAL VASCULAR  INTERVENTION;  Surgeon: Elmira Newman PARAS, MD;  [facility]: [facility];  Service: Cardiovascular;  Laterality: Left;   REMOVAL OF PENILE PROSTHESIS N/A 06/14/2021   Procedure: Removal of THREE piece inflatable penile prosthesis;  Surgeon: Carolee Sherwood JONETTA DOUGLAS, MD;  Location: [facility] OR;  Service: Urology;  Laterality: N/A;   SHOULDER ARTHROSCOPY     SPINE SURGERY     TEMPORARY PACEMAKER N/A 02/13/2024   Procedure: TEMPORARY PACEMAKER;  Surgeon: Elmira Newman PARAS, MD;  Location:  [facility];  Service: Cardiovascular;  Laterality: N/A;   TOE AMPUTATION Left    TONSILLECTOMY     TOTAL KNEE ARTHROPLASTY  07/2002   RIGHT KNEE ; SURGEON  DR. [name] ALSO HAD ARTHROSCOPIC RIGHT KNEE IN  10/2001   TOTAL KNEE ARTHROPLASTY     ULNAR NERVE TRANSPOSITION Right 10/21/2013   Procedure: RIGHT ELBOW  ULNAR NERVE DECOMPRESSION;  Surgeon: Arley JONELLE Curia, MD;  Location: [facility];  Service: Orthopedics;  Laterality: Right;   Family History  Family History  Problem Relation Age of Onset   Heart disease Father        Before age 73-  CAD, BPG   Diabetes Father        Amputation   Cancer Father        PROSTATE   Hyperlipidemia Father    Hypertension Father    Heart attack Father        Triple BPG   Varicose Veins Father    Cancer Sister        Breast   Hyperlipidemia Sister    Hypertension Sister    Heart attack Brother    Colon cancer Brother    Diabetes Brother    Heart disease Brother 43       A-Fib. Before age 42   Hyperlipidemia Brother    Hypertension Brother    Hypertension Son    Arthritis Other        GRANDMOTHER   Hypertension Other        OTHER FAMILY MEMBERS   Social History  reports that he has been smoking cigarettes. He has a 35 pack-year smoking history. He has never used smokeless tobacco. He reports that he does not currently use alcohol . He reports that he does not use drugs. Allergies Allergies[1] Home medications Prior to Admission medications   Medication Sig Start Date End Date Taking? Authorizing Provider  amLODipine  (NORVASC ) 5 MG tablet Take 5 mg by mouth daily.   Yes [provider]  atorvastatin  (LIPITOR ) 80 MG tablet Take 1 tablet (80 mg total) by mouth at bedtime. 11/25/23  Yes Amoako, Prince, MD  B Complex-C-Zn-Folic Acid  (DIALYVITE /ZINC ) TABS Take 1 tablet by mouth See admin instructions. Give 1 tablet by mouth 3 days a week prior to dialysis on Tuesday, Thursday, Saturday. 10/04/21  Yes [provider]  colchicine  0.6 MG tablet Take 0.5 tablets (0.3 mg total) by mouth daily. Patient taking differently: Take 0.3 mg by mouth at bedtime. 02/21/24  Yes Gonfa, Taye T, MD  Doxercalciferol  (HECTOROL  IV) Inject 2 mcg into the vein Every Tuesday,Thursday,and Saturday with dialysis. Medication is provided and administered by [facility]; Hectorol  IV 2mcg x3 week on Tuesday, Thursday and Saturday - during dialysis treatment.   Yes [provider]  famotidine  (PEPCID ) 20 MG tablet Take 20 mg by mouth See admin instructions. Give 1 tablet (20mg ) by mouth every other morning.   Yes [provider]  fentaNYL  (DURAGESIC ) 50 MCG/HR Place 1 patch onto the skin every 3 (three) days. 08/12/24  Yes Raenelle Coria, MD  ferrous sulfate  325 (65 FE) MG tablet Take 325 mg by mouth in the morning and at bedtime.   Yes [provider]  folic acid  (FOLVITE ) 1 MG tablet Take 1 mg by mouth daily. 09/18/21  Yes [provider]  gabapentin  (NEURONTIN ) 100 MG capsule Take 100 mg by mouth 3 (three) times daily.   Yes [provider]  insulin  aspart (NOVOLOG  FLEXPEN) 100  UNIT/ML FlexPen Inject 4-16 Units into the skin See admin instructions. Inject 4 units subcutaneously three times daily before meals, in ADDITION to 0-12 units per sliding scale: 70 - 150 : 0 units 151-200 : 2 units 201-250 : 4 units 251-300 : 6 units 301-350 : 8 units 351-400 : 10 units 401-450 : 12 units > 400 : give 12  units and recheck in 1 hour - if still > 400, call MD/NP for further orders.   Yes [provider]  ipratropium-albuterol  (DUONEB) 0.5-2.5 (3) MG/3ML SOLN Take 3 mLs by nebulization every 6 (six) hours as needed (wheezing, shortness of breath). 05/26/24  Yes [provider]  Iron Sucrose  (VENOFER  IV) Inject 100 mg into the vein Every Tuesday,Thursday,and Saturday with dialysis. Medication is provided and administered by [facility]; Venofer  IV 50 mg x3 week on Tuesday, Thursday and Saturday - during dialysis treatment.   Yes [provider]  isosorbide  mononitrate (IMDUR ) 60 MG 24 hr tablet Take 60 mg by mouth daily. Give 60mg  by mouth one time a day   Yes [provider]  mineral oil-hydrophilic petrolatum (AQUAPHOR) ointment Apply 1 Application topically every 8 (eight) hours as needed for dry skin.   Yes [provider]  nitroGLYCERIN  (NITROSTAT ) 0.4 MG SL tablet Place 0.4 mg under the tongue every 5 (five) minutes as needed for chest pain. 01/06/22  Yes [provider]  Nystatin ([name]'S BUTT CREAM) CREA Apply 1 Application topically in the morning and at bedtime.   Yes [provider]  rOPINIRole  (REQUIP ) 2 MG tablet Take 2 mg by mouth at bedtime.   Yes [provider]  senna (SENOKOT) 8.6 MG TABS tablet Take 17.2 mg by mouth at bedtime.   Yes [provider]  sevelamer  carbonate (RENVELA ) 800 MG tablet Take 2 tablets (1,600 mg total) by mouth 3 (three) times daily with meals. Patient taking differently: Take 800-1,600 mg by mouth See admin instructions. Give 1 tablet (800mg ) by mouth before meals for hypocalcemia take 2 tablets (1600mg ) by mouth 3 times daily with meals. 02/04/24  Yes Caleen Qualia, MD  sodium zirconium cyclosilicate  (LOKELMA ) 10 g PACK packet Take 10 g by mouth See admin instructions. Give 1 packet (10g) by mouth 4 days a week in the morning on Monday, Wednesday, Friday, Sunday.   Yes  [provider]  tamsulosin  (FLOMAX ) 0.4 MG CAPS capsule Take 0.4 mg by mouth daily.   Yes [provider]  apixaban  (ELIQUIS ) 5 MG TABS tablet Take 1 tablet (5 mg total) by mouth 2 (two) times daily. 01/14/22 09/09/24  Gonfa, Taye T, MD  clopidogrel  (PLAVIX ) 75 MG tablet Take 1 tablet (75 mg total) by mouth daily with breakfast. Patient taking differently: Take 75 mg by mouth daily. 11/25/23 11/24/24  Amoako, Prince, MD  lidocaine -prilocaine  (EMLA ) cream Apply 1 application  topically See admin instructions. Apply topically 3 days a week prior to dialysis on Tuesday, Thursday, Saturday. 06/06/21   [provider]  loratadine  (CLARITIN ) 10 MG tablet Take 10 mg by mouth daily.    [provider]  promethazine  (PHENERGAN ) 12.5 MG suppository Place 12.5 mg rectally daily as needed for nausea or vomiting.    [provider]     Vitals:   08/26/24 1015 08/26/24 1100 08/26/24 1200 08/26/24 1206  BP: 105/62 (!) 110/56 101/84   Pulse: 90 86 (!) 106   Resp: 20 19 (!) 24   Temp: 99.3 F (37.4 C) 99.3 F (37.4 C) 99.5  F (37.5 C)   TempSrc:   Bladder   SpO2: 100% 100% 100% 100%  Weight:      Height:       Exam Gen on vent, sedated Sclera anicteric, throat w/ ETT +JVD Chest clear anterior/ lateral RRR no MRG Abd soft ntnd no mass or ascites +bs GU deferred Ext 2+ bilat BKA edema, mild abd wall edema Neuro is on vent, sedated    LUA aVF+bruit   Home bp meds: Norvasc    [place] [facility] From [place] dec 20[place] -> 4h  B450   108kg AVF   Heparin  none    Assessment/ Plan: AMS: felt to be opiate intoxication w/ underlying ESRD w/ noncompliance/ missed dialysis as well. Has received narcan  > 1 time in ED yesterday. Holding opiates. On vent and sedated now. Plan HD today.  ESRD: on HD TTS. Had 1 hr of HD yest. Plan HD today in ICU off schedule.  Shock: pt was on levo support last night, and was weaned off it appears early this am. Getting IV  cefepime / vanc as empiric Rx.  Volume: 7kg up by weights, diffuse LE edema on exam, CXR w/ vasc congestion. Max UF w/ IV alb and levo support if needed for HD in ICU today.  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, follow Afib/ RVR: on IV diltiazem     Rob Geralynn  MD [facility] 08/26/2024, 12:22 PM  Recent Labs  Lab 08/25/24 1415 08/25/24 1421 08/25/24 1538 08/25/24 2032  HGB 11.5* 12.6* 12.9*  --   ALBUMIN   --   --   --  3.7  CALCIUM  7.9*  --   --  8.2*  PHOS  --   --   --  7.1*  CREATININE 6.63*  --  7.30* 7.16*  K 5.4* 5.1 5.2* 5.6*   Inpatient medications:  Chlorhexidine  Gluconate Cloth  6 each Topical Daily   Chlorhexidine  Gluconate Cloth  6 each Topical Q0600   [START ON 08/27/2024] doxercalciferol   2 mcg Intravenous Q T,Th,Sa-HD   fentaNYL  (SUBLIMAZE ) injection  25-50 mcg Intravenous Once   insulin  aspart  0-6 Units Subcutaneous Q4H   mupirocin  ointment  1 Application Nasal BID   mouth rinse  15 mL Mouth Rinse Q2H   pantoprazole  (PROTONIX ) IV  40 mg Intravenous QHS   vancomycin  variable dose per unstable renal function (pharmacist dosing)   Does not apply See admin instructions    anticoagulant sodium citrate      ceFEPime  (MAXIPIME ) IV Stopped (08/25/24 2214)   fentaNYL  infusion INTRAVENOUS Stopped (08/25/24 2147)   heparin  1,400 Units/hr (08/26/24 1200)   norepinephrine  (LEVOPHED ) Adult infusion Stopped (08/26/24 0640)   propofol  (DIPRIVAN ) infusion Stopped (08/26/24 0522)   alteplase , anticoagulant sodium citrate , fentaNYL , heparin , lidocaine  (PF), lidocaine -prilocaine , mouth rinse, pentafluoroprop-tetrafluoroeth, polyethylene glycol      [1]  Allergies Allergen Reactions   Contrast Media [Iodinated Contrast Media] Shortness Of Breath and Other (See Comments)    Altered mental status     Ivp Dye [Iodinated Contrast Media] Anaphylaxis, Shortness Of Breath and Other (See Comments)    Altered mental status     Latex Rash    severe rash appears after the first 24 hours of  being placed   Tape Rash and Other (See Comments)    Rash after 1 day of use  Do not leave on longer then 3 days without changing

## 2024-08-26 NOTE — Procedures (Signed)
 S: seen in ICU on HD. Clotted 1st system, will add IV hep bolus 3000 units and try again.   Vitals:   08/26/24 1200 08/26/24 1206 08/26/24 1245 08/26/24 1304  BP: 101/84  98/62 106/61  Pulse: (!) 106  94 86  Resp: (!) 24  19 (!) 21  Temp: 99.5 F (37.5 C)  99.5 F (37.5 C) 99.5 F (37.5 C)  TempSrc: Bladder     SpO2: 100% 100% 100% 100%  Weight:   115.2 kg   Height:        Recent Labs  Lab 08/25/24 1415 08/25/24 1421 08/25/24 1538 08/25/24 2032  HGB 11.5* 12.6* 12.9*  --   ALBUMIN   --   --   --  3.7  CALCIUM  7.9*  --   --  8.2*  PHOS  --   --   --  7.1*  CREATININE 6.63*  --  7.30* 7.16*  K 5.4* 5.1 5.2* 5.6*    Inpatient medications:  Chlorhexidine  Gluconate Cloth  6 each Topical Daily   Chlorhexidine  Gluconate Cloth  6 each Topical Q0600   [START ON 08/27/2024] doxercalciferol   2 mcg Intravenous Q T,Th,Sa-HD   fentaNYL  (SUBLIMAZE ) injection  25-50 mcg Intravenous Once   insulin  aspart  0-6 Units Subcutaneous Q4H   mupirocin  ointment  1 Application Nasal BID   mouth rinse  15 mL Mouth Rinse Q2H   pantoprazole  (PROTONIX ) IV  40 mg Intravenous QHS   vancomycin  variable dose per unstable renal function (pharmacist dosing)   Does not apply See admin instructions    albumin  human     anticoagulant sodium citrate      ceFEPime  (MAXIPIME ) IV Stopped (08/25/24 2214)   fentaNYL  infusion INTRAVENOUS Stopped (08/25/24 2147)   heparin  1,400 Units/hr (08/26/24 1200)   norepinephrine  (LEVOPHED ) Adult infusion 2 mcg/min (08/26/24 1254)   propofol  (DIPRIVAN ) infusion Stopped (08/26/24 0522)   vancomycin      albumin  human, alteplase , anticoagulant sodium citrate , fentaNYL , heparin , lidocaine  (PF), lidocaine -prilocaine , mouth rinse, pentafluoroprop-tetrafluoroeth, polyethylene glycol  I was present at the procedure, reviewed the HD regimen and made appropriate changes.   Myer Fret MD  [facility] 08/26/2024, 1:23 PM

## 2024-08-26 NOTE — Progress Notes (Signed)
 Pharmacy Antibiotic Note  [name] is a 68 y.o. male admitted on 08/25/2024 with sepsis.  Pharmacy has been consulted for vancomycin  dosing.  Plan: Patient loaded with vancomycin  2000mg  on 12/16, planning for HD today. Will give 1000mg  IV vancomycin  x1.  Cefepime  1g q24h per MD.  Discussed with CCM - unclear source of infection, will consider de-escalation tomorrow.  Follow culture data for de-escalation.  Monitor renal function for dose adjustments as indicated.   Height: 6' 2 (188 cm) Weight: 115.2 kg (253 lb 15.5 oz) IBW/kg (Calculated) : 82.2  Temp (24hrs), Avg:98.8 F (37.1 C), Min:97.7 F (36.5 C), Max:99.5 F (37.5 C)  Recent Labs  Lab 08/25/24 1415 08/25/24 1538 08/25/24 2032 08/25/24 2040  WBC 6.3  --   --   --   CREATININE 6.63* 7.30* 7.16*  --   LATICACIDVEN  --   --   --  1.5    Estimated Creatinine Clearance: 13 mL/min (A) (by C-G formula based on SCr of 7.16 mg/dL (H)).    Allergies[1]  Antimicrobials this admission: Vancomycin  12/16 >>  Cefepime  12/16 >>    Microbiology results: 12/16 BCx:  12/16 MRSA PCR: +  Thank you for allowing pharmacy to be a part of this patients care.  Powell Blush, PharmD, BCCCP  08/26/2024 1:01 PM     [1]  Allergies Allergen Reactions   Contrast Media [Iodinated Contrast Media] Shortness Of Breath and Other (See Comments)    Altered mental status     Ivp Dye [Iodinated Contrast Media] Anaphylaxis, Shortness Of Breath and Other (See Comments)    Altered mental status     Latex Rash    severe rash appears after the first 24 hours of being placed   Tape Rash and Other (See Comments)    Rash after 1 day of use  Do not leave on longer then 3 days without changing

## 2024-08-26 NOTE — Progress Notes (Signed)
 Initial Nutrition Assessment  DOCUMENTATION CODES:  Not applicable  INTERVENTION:  If unable to extubated, recommend starting enteral nutrition via OG-tube: Start Vital 1.5 at 20 mL/hr, advance by 10 mL every 6 hours to goal rate of 55 mL/hr (1320 mL per day) 60 mL ProSource TF20 - BID Regimen at goal provides 2140 calories, 129 gm protein, and 1008 mL free water  daily. Renal Multivitamin w/ minerals daily  NUTRITION DIAGNOSIS:  Inadequate oral intake related to inability to eat as evidenced by NPO status.  GOAL:  Patient will meet greater than or equal to 90% of their needs  MONITOR:  TF tolerance, I & O's, Vent status, Labs, Weight trends  REASON FOR ASSESSMENT:  Ventilator    ASSESSMENT:  68 y.o male presented to the ED after being found unresponsive during dialysis. PMH includes bilateral BKA, COPD, GERD, HLD, ESRD on HD, CAD, HTN, and previous ileostomy (now reversed). Pt admitted with acute toxic metabolic encephalopathy likely opiate intoxication.   12/16 - Admitted; Intubated  Patient is currently intubated on ventilator support. No family at bedside at time of RD assessment. Discussed with RN, plan for dialysis and MRI. If unable to extubate, plan to start tube feeds.   MV: 103.1 L/min MAP (cuff): 91 Temp (24hrs), Avg:98.9 F (37.2 C), Min:97.7 F (36.5 C), Max:99.5 F (37.5 C)  Admission Weight: 115.2 kg Current Weight: 115.2 kg EDW: 108 kg  Nutrition Related Medications: Doxercalciferol , Novolog  0-6 units q4h, Protonix  Drips  Cefepime  Heparin  Labs (08/25/24): Sodium 134, Potassium 5.6, Phosphorus 7.1, Magnesium  1.9, Hgb A1c 6.4 (08/12/24)   CBG: 81-150 mg/dL x 24 hrs   UOP: 30 ML x 24 hrs OG-tube Output: 100 mL x 24 hrs  NUTRITION - FOCUSED PHYSICAL EXAM:  Flowsheet Row Most Recent Value  Orbital Region No depletion  Upper Arm Region No depletion  Thoracic and Lumbar Region No depletion  Buccal Region Unable to assess  [ETT Holder]  Temple Region  No depletion  Clavicle Bone Region No depletion  Clavicle and Acromion Bone Region No depletion  Scapular Bone Region No depletion  Dorsal Hand No depletion  Patellar Region Unable to assess  Anterior Thigh Region Unable to assess  Posterior Calf Region Unable to assess  [Bilateral BKA]  Edema (RD Assessment) Mild  Hair Reviewed  Eyes Unable to assess  Mouth Unable to assess  Skin Reviewed  Nails Reviewed   Diet Order:   Diet Order             Diet NPO time specified  Diet effective now                  EDUCATION NEEDS:  Not appropriate for education at this time  Skin:  Skin Assessment: Reviewed RN Assessment (Stage 2: Sacrum)  Last BM:  PTA/Unknown  Height:  Ht Readings from Last 1 Encounters:  08/25/24 6' 2 (1.88 m)   Weight:  Wt Readings from Last 1 Encounters:  08/26/24 115.2 kg   Ideal Body Weight:  77.3 kg  BMI:  Body mass index is 32.61 kg/m. Adjusted for BKA: 34.7 kg/m2  Estimated Nutritional Needs:  Kcal:  2100-2300 Protein:  105-125 grams Fluid:  UOP +1L   Nestora Glatter RD, LDN Registered Dietitian I Please see AMION for contact information

## 2024-08-26 NOTE — Progress Notes (Signed)
 eLink Physician-Brief Progress Note Patient Name: [name] DOB: 10-27-1955 MRN: [id number]   Date of Service  08/26/2024  HPI/Events of Note  Patient needs restraints to prevent self-extubation.  eICU Interventions  Restraints ordered.        Abimbola Aki U Caedin Mogan 08/26/2024, 8:48 PM

## 2024-08-27 ENCOUNTER — Inpatient Hospital Stay (HOSPITAL_COMMUNITY)

## 2024-08-27 DIAGNOSIS — R0603 Acute respiratory distress: Secondary | ICD-10-CM

## 2024-08-27 DIAGNOSIS — I2510 Atherosclerotic heart disease of native coronary artery without angina pectoris: Secondary | ICD-10-CM | POA: Diagnosis not present

## 2024-08-27 DIAGNOSIS — N186 End stage renal disease: Secondary | ICD-10-CM | POA: Diagnosis not present

## 2024-08-27 DIAGNOSIS — E1122 Type 2 diabetes mellitus with diabetic chronic kidney disease: Secondary | ICD-10-CM

## 2024-08-27 DIAGNOSIS — Z7189 Other specified counseling: Secondary | ICD-10-CM

## 2024-08-27 DIAGNOSIS — Z8673 Personal history of transient ischemic attack (TIA), and cerebral infarction without residual deficits: Secondary | ICD-10-CM

## 2024-08-27 DIAGNOSIS — Z992 Dependence on renal dialysis: Secondary | ICD-10-CM

## 2024-08-27 DIAGNOSIS — G9340 Encephalopathy, unspecified: Secondary | ICD-10-CM

## 2024-08-27 DIAGNOSIS — R4189 Other symptoms and signs involving cognitive functions and awareness: Secondary | ICD-10-CM | POA: Diagnosis not present

## 2024-08-27 DIAGNOSIS — I739 Peripheral vascular disease, unspecified: Secondary | ICD-10-CM | POA: Diagnosis not present

## 2024-08-27 DIAGNOSIS — I495 Sick sinus syndrome: Secondary | ICD-10-CM

## 2024-08-27 DIAGNOSIS — I480 Paroxysmal atrial fibrillation: Secondary | ICD-10-CM

## 2024-08-27 DIAGNOSIS — I482 Chronic atrial fibrillation, unspecified: Secondary | ICD-10-CM

## 2024-08-27 DIAGNOSIS — L8990 Pressure ulcer of unspecified site, unspecified stage: Secondary | ICD-10-CM | POA: Insufficient documentation

## 2024-08-27 DIAGNOSIS — J9601 Acute respiratory failure with hypoxia: Secondary | ICD-10-CM

## 2024-08-27 DIAGNOSIS — I132 Hypertensive heart and chronic kidney disease with heart failure and with stage 5 chronic kidney disease, or end stage renal disease: Secondary | ICD-10-CM

## 2024-08-27 DIAGNOSIS — G929 Unspecified toxic encephalopathy: Secondary | ICD-10-CM

## 2024-08-27 LAB — LIPID PANEL
Cholesterol: 92 mg/dL (ref 0–200)
HDL: 28 mg/dL — ABNORMAL LOW (ref >40)
LDL Cholesterol: 42 mg/dL (ref 0–99)
Total CHOL/HDL Ratio: 3.2 ratio
Triglycerides: 105 mg/dL (ref <150)
VLDL: 21 mg/dL (ref 0–40)

## 2024-08-27 LAB — BASIC METABOLIC PANEL WITH GFR
Anion gap: 20 — ABNORMAL HIGH (ref 5–15)
BUN: 44 mg/dL — ABNORMAL HIGH (ref 8–23)
CO2: 20 mmol/L — ABNORMAL LOW (ref 22–32)
Calcium: 8.6 mg/dL — ABNORMAL LOW (ref 8.9–10.3)
Chloride: 91 mmol/L — ABNORMAL LOW (ref 98–111)
Creatinine, Ser: 6.07 mg/dL — ABNORMAL HIGH (ref 0.61–1.24)
GFR, Estimated: 9 mL/min — ABNORMAL LOW (ref >60)
Glucose, Bld: 150 mg/dL — ABNORMAL HIGH (ref 70–99)
Potassium: 5.5 mmol/L — ABNORMAL HIGH (ref 3.5–5.1)
Sodium: 131 mmol/L — ABNORMAL LOW (ref 135–145)

## 2024-08-27 LAB — GLUCOSE, CAPILLARY
Glucose-Capillary: 127 mg/dL — ABNORMAL HIGH (ref 70–99)
Glucose-Capillary: 130 mg/dL — ABNORMAL HIGH (ref 70–99)
Glucose-Capillary: 140 mg/dL — ABNORMAL HIGH (ref 70–99)
Glucose-Capillary: 155 mg/dL — ABNORMAL HIGH (ref 70–99)
Glucose-Capillary: 155 mg/dL — ABNORMAL HIGH (ref 70–99)
Glucose-Capillary: 166 mg/dL — ABNORMAL HIGH (ref 70–99)

## 2024-08-27 LAB — APTT: aPTT: 26 s (ref 24–36)

## 2024-08-27 LAB — CBC
HCT: 38.2 % — ABNORMAL LOW (ref 39.0–52.0)
Hemoglobin: 12.1 g/dL — ABNORMAL LOW (ref 13.0–17.0)
MCH: 27.8 pg (ref 26.0–34.0)
MCHC: 31.7 g/dL (ref 30.0–36.0)
MCV: 87.6 fL (ref 80.0–100.0)
Platelets: 144 K/uL — ABNORMAL LOW (ref 150–400)
RBC: 4.36 MIL/uL (ref 4.22–5.81)
RDW: 17.4 % — ABNORMAL HIGH (ref 11.5–15.5)
WBC: 13.4 K/uL — ABNORMAL HIGH (ref 4.0–10.5)
nRBC: 0 % (ref 0.0–0.2)

## 2024-08-27 LAB — MAGNESIUM: Magnesium: 1.9 mg/dL (ref 1.7–2.4)

## 2024-08-27 LAB — PHOSPHORUS: Phosphorus: 6.7 mg/dL — ABNORMAL HIGH (ref 2.5–4.6)

## 2024-08-27 LAB — HEPARIN LEVEL (UNFRACTIONATED)
Heparin Unfractionated: <0.1 [IU]/mL — ABNORMAL LOW (ref 0.30–0.70)
Heparin Unfractionated: <0.1 [IU]/mL — ABNORMAL LOW (ref 0.30–0.70)

## 2024-08-27 MED ORDER — FENTANYL CITRATE (PF) 50 MCG/ML IJ SOSY
25.0000 ug | PREFILLED_SYRINGE | INTRAMUSCULAR | Status: DC | PRN
  Administered 2024-08-27: 03:00:00 50 ug via INTRAVENOUS
  Filled 2024-08-27: qty 2

## 2024-08-27 MED ORDER — HEPARIN BOLUS VIA INFUSION
2000.0000 [IU] | Freq: Once | INTRAVENOUS | Status: AC
  Administered 2024-08-27: 11:00:00 2000 [IU] via INTRAVENOUS
  Filled 2024-08-27: qty 2000

## 2024-08-27 MED ORDER — DOXERCALCIFEROL 4 MCG/2ML IV SOLN
2.0000 ug | INTRAVENOUS | Status: DC
  Administered 2024-08-28 – 2024-09-02 (×3): 2 ug via INTRAVENOUS
  Filled 2024-08-27 (×3): qty 2

## 2024-08-27 MED ORDER — DEXMEDETOMIDINE HCL IN NACL 400 MCG/100ML IV SOLN
0.0000 ug/kg/h | INTRAVENOUS | Status: DC
  Administered 2024-08-27: 11:00:00 0.4 ug/kg/h via INTRAVENOUS
  Administered 2024-08-27: 23:00:00 0.5 ug/kg/h via INTRAVENOUS
  Administered 2024-08-28: 0.6 ug/kg/h via INTRAVENOUS
  Filled 2024-08-27 (×4): qty 100

## 2024-08-27 MED ORDER — VITAL HP 1.0 CAL PO LIQD
1000.0000 mL | ORAL | Status: DC
  Administered 2024-08-27: 18:00:00 1000 mL

## 2024-08-27 MED ORDER — CHLORHEXIDINE GLUCONATE CLOTH 2 % EX PADS: 6.0000 | MEDICATED_PAD | Freq: Every day | CUTANEOUS | Status: DC

## 2024-08-27 MED ADMIN — Heparin Sodium (Porcine) Inj 5000 Unit/ML: 2000 [IU] | INTRAVENOUS | @ 21:00:00 | NDC 63323051701

## 2024-08-27 NOTE — Consult Note (Addendum)
 NEUROLOGY CONSULT NOTE   Date of service: August 27, 2024 Patient Name: [name] MRN:  [id number] DOB:  December 31, 1955 Chief Complaint: Altered mental status Requesting Provider: Gatha Pence, MD  History of Present Illness  [name] is a 68 y.o. male with hx of with paroxysmal afib previously on eliquis , CVA, CAD, PAD with bilateral AKA, CKD, NSTEMI, ESRD on HD (T, TH, S), and COPD who initially presented from dialysis after being found unresponsive.  A week prior to this he was also sent to the ED from dialysis due to shortness of breath.  He was intubated on arrival.  MRI shows hemosiderin staining in the left parietal and occipital lobes and neurology was asked to weigh in on anticoagulation.  On exam he is sedated, he does have spontaneous movements, not following commands.  Eyes are midline.  ROS   Unable to ascertain due to altered mental status   Past History   Past Medical History:  Diagnosis Date   Carotid artery occlusion 11/10/2010   LEFT CAROTID ENDARTERECTOMY   Complication of anesthesia    BP WENT UP AT [facility]    COPD (chronic obstructive pulmonary disease) (HCC)    pt denies this dx as of 06/01/20 - no inhaler    Diabetes mellitus without complication (HCC)    Diverticulitis    Diverticulosis of colon (without mention of hemorrhage)    DJD (degenerative joint disease)    knees/hands/feet/back/neck   ESRD (end stage renal disease) on dialysis (HCC)    Fatty liver    Full dentures    GERD (gastroesophageal reflux disease)    H/O hiatal hernia    History of blood transfusion    with a past surical procedure per patient 06/01/20   History of right below knee amputation (HCC) 07/12/2016   Hyperlipidemia    Hypertension    Neuromuscular disorder (HCC)    peripheral neuropathy   Non-pressure chronic ulcer of other part of left foot limited to breakdown of skin (HCC) 11/12/2016   Nonrheumatic aortic (valve) stenosis 03/18/2024   Limited TTE 03/18/2024: EF  55-60, moderate LVH, normal RVSF, moderate LAE, mild MR, mild-moderate AS (mean 12, V-max 235 cm/s, DI 0.35), aortic root 39 mm, moderate pleural effusion, trivial pericardial effusion    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 Atherectomy left popliteal artery July 2012.    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   Unstable angina (HCC) 09/16/2018   Wears glasses     Past Surgical History:  Procedure Laterality Date   ABDOMINAL AORTOGRAM W/LOWER EXTREMITY N/A 06/23/2021   Procedure: ABDOMINAL AORTOGRAM W/LOWER EXTREMITY;  Surgeon: Elmira Newman PARAS, MD;  [facility]: [facility];  Service: Cardiovascular;  Laterality: N/A;   AMPUTATION  11/05/2011   Procedure: AMPUTATION RAY;  Surgeon: Norleen Armor, MD;  Location: [facility] OR;  Service: Orthopedics;  Laterality: Right;  Amputation of Right 4&5th Toes   AMPUTATION Left 11/26/2012   Procedure: AMPUTATION RAY;  Surgeon: Norleen Armor, MD;  Location: [facility];  Service: Orthopedics;  Laterality: Left;  fourth ray amputation   AMPUTATION Right 08/27/2014   Procedure: Transmetatarsal Amputation;  Surgeon: Jerona  Harden GAILS, MD;  Location: [facility];  Service: Orthopedics;  Laterality: Right;   AMPUTATION Right 01/14/2015   Procedure: AMPUTATION BELOW KNEE;  Surgeon: Jerona Harden GAILS, MD;  Location: [facility];  Service: Orthopedics;  Laterality: Right;   AMPUTATION Left 10/21/2015   Procedure: Left Foot 5th Ray Amputation;  Surgeon: Jerona Harden GAILS, MD;  Location: [facility] OR;  Service: Orthopedics;  Laterality: Left;   AMPUTATION Left 07/04/2022   Procedure: LEFT BELOW KNEE AMPUTATION;  Surgeon: Harden Jerona GAILS, MD;  Location: [facility] OR;  Service: Orthopedics;  Laterality: Left;    ANTERIOR FUSION CERVICAL SPINE  02/06/2006   C4-5, C5-6, C6-7; SURGEON DR. MAX COHEN   AV FISTULA PLACEMENT Left 06/02/2020   Procedure: ARTERIOVENOUS (AV) FISTULA CREATION LEFT;  Surgeon: Sheree Penne Bruckner, MD;  Location: [facility] OR;  Service: Vascular;  Laterality: Left;   BACK SURGERY     x 3   BASCILIC VEIN TRANSPOSITION Left 07/21/2020   Procedure: LEFT UPPER ARM ATERIOVENOUS SUPERFISTULALIZATION;  Surgeon: Sheree Penne Bruckner, MD;  Location: [facility] OR;  Service: Vascular;  Laterality: Left;   BELOW KNEE LEG AMPUTATION Right    CARDIAC CATHETERIZATION  10/31/2004   2009   CAROTID ENDARTERECTOMY  11/10/2010   CAROTID ENDARTERECTOMY Left 11/10/2010   Subtotal occlusion of left internal carotid artery with left hemispheric transient ischemic attacks.   CAROTID STENT     CARPAL TUNNEL RELEASE Right 10/21/2013   Procedure: RIGHT CARPAL TUNNEL RELEASE;  Surgeon: Arley JONELLE Curia, MD;  Location: [facility];  Service: Orthopedics;  Laterality: Right;   CHOLECYSTECTOMY     COLON SURGERY     COLONOSCOPY     COLOSTOMY REVERSAL  05/21/2018   ileostomy reversal   CORONARY LITHOTRIPSY N/A 11/24/2023   Procedure: CORONARY LITHOTRIPSY;  Surgeon: Jordan, Peter M, MD;  Location: [facility] INVASIVE CV LAB;  Service: Cardiovascular;  Laterality: N/A;   CORONARY STENT INTERVENTION N/A 11/24/2023   Procedure: CORONARY STENT INTERVENTION;  Surgeon: Jordan, Peter M, MD;  Location: [facility] INVASIVE CV LAB;  Service: Cardiovascular;  Laterality: N/A;   CYSTOSCOPY WITH STENT PLACEMENT Bilateral 01/13/2018   Procedure: CYSTOSCOPY WITH BILATERAL URETERAL CATHETER PLACEMENT;  Surgeon: Cam Morene ORN, MD;  Location: [facility] ORS;  Service: Urology;  Laterality: Bilateral;   ESOPHAGEAL MANOMETRY Bilateral 07/19/2014   Procedure: ESOPHAGEAL MANOMETRY (EM);  Surgeon: Gordy CHRISTELLA Starch, MD;  Location: [facility];  Service: Gastroenterology;  Laterality: Bilateral;   EYE SURGERY Bilateral 2020   cataract    FEMORAL ARTERY STENT     x6   FINGER SURGERY     FOOT SURGERY  04/25/2016    EXCISION BASE 5TH METATARSAL AND PARTIAL CUBOID LEFT FOOT   HERNIA REPAIR     LEFT INGUINAL AND UMBILICAL REPAIRS   HERNIA REPAIR     I & D EXTREMITY Left 04/25/2016   Procedure: EXCISION BASE 5TH METATARSAL AND PARTIAL CUBOID LEFT FOOT;  Surgeon: Jerona GAILS Harden, MD;  Location: [facility];  Service: Orthopedics;  Laterality: Left;   ILEOSTOMY  01/13/2018   Procedure: ILEOSTOMY;  Surgeon: Signe Mitzie LABOR, MD;  Location: [facility];  Service: General;;   ILEOSTOMY CLOSURE N/A 05/21/2018   Procedure: ILEOSTOMY REVERSAL ERAS PATHWAY;  Surgeon: Signe Mitzie LABOR, MD;  Location: [facility];  Service: General;  Laterality: N/A;   IR RADIOLOGIST EVAL & MGMT  11/19/2017   IR RADIOLOGIST EVAL & MGMT  12/03/2017   IR RADIOLOGIST EVAL & MGMT  12/18/2017   JOINT REPLACEMENT Right 2001  Total knee   LAMINECTOMY     X 3 LUMBAR AND X 2 CERVICAL SPINE OPERATIONS   LAPAROSCOPIC CHOLECYSTECTOMY W/ CHOLANGIOGRAPHY  11/09/2004   SURGEON DR. DEWARD CANDIE NULL   LEFT HEART CATH AND CORONARY ANGIOGRAPHY N/A 09/16/2018   Procedure: LEFT HEART CATH AND CORONARY ANGIOGRAPHY;  Surgeon: Elmira Newman PARAS, MD;  [facility];  Service: Cardiovascular;  Laterality: N/A;   LEFT HEART CATH AND CORONARY ANGIOGRAPHY N/A 11/24/2023   Procedure: LEFT HEART CATH AND CORONARY ANGIOGRAPHY;  Surgeon: Jordan, Peter M, MD;  Location: [facility] INVASIVE CV LAB;  Service: Cardiovascular;  Laterality: N/A;   LEFT HEART CATHETERIZATION WITH CORONARY ANGIOGRAM N/A 10/29/2014   Procedure: LEFT HEART CATHETERIZATION WITH CORONARY ANGIOGRAM;  Surgeon: Erick JONELLE Bergamo, MD;  Location: [facility] CATH LAB;  Service: Cardiovascular;  Laterality: N/A;   LIGATION OF COMPETING BRANCHES OF ARTERIOVENOUS FISTULA Left 07/21/2020   Procedure: LIGATION OF COMPETING BRANCHES OF LEFT UPPER ARM ARTERIOVENOUS FISTULA;  Surgeon: Sheree Penne Bruckner, MD;  Location: [facility] OR;  Service:  Vascular;  Laterality: Left;   LOWER EXTREMITY ANGIOGRAM N/A 03/19/2012   Procedure: LOWER EXTREMITY ANGIOGRAM;  Surgeon: Bruckner JONETTA Cash, MD;  Location: [facility] CATH LAB;  Service: Cardiovascular;  Laterality: N/A;   LOWER EXTREMITY ANGIOGRAPHY N/A 06/20/2021   Procedure: LOWER EXTREMITY ANGIOGRAPHY;  Surgeon: Elmira Newman PARAS, MD;  [facility];  Service: Cardiovascular;  Laterality: N/A;   NECK SURGERY     PARTIAL COLECTOMY N/A 01/13/2018   Procedure: LAPAROSCOPIC ASSISTED   SIGMOID COLECTOMY ILEOSTOMY;  Surgeon: Signe Mitzie LABOR, MD;  Location: [facility]S;  Service: General;  Laterality: N/A;   PENILE PROSTHESIS IMPLANT  08/14/2005   INFRAPUBIC INSERTION OF INFLATABLE PENILE PROSTHESIS; SURGEON DR. JANIT   PENILE PROSTHESIS IMPLANT     PERCUTANEOUS CORONARY STENT INTERVENTION (PCI-S) Right 10/29/2014   Procedure: PERCUTANEOUS CORONARY STENT INTERVENTION (PCI-S);  Surgeon: Erick JONELLE Bergamo, MD;  Location: [facility] CATH LAB;  Service: Cardiovascular;  Laterality: Right;   PERICARDIOCENTESIS N/A 02/13/2024   Procedure: PERICARDIOCENTESIS;  Surgeon: Elmira Newman PARAS, MD;  [facility];  Service: Cardiovascular;  Laterality: N/A;   PERIPHERAL VASCULAR INTERVENTION Left 06/23/2021   Procedure: PERIPHERAL VASCULAR INTERVENTION;  Surgeon: Elmira Newman PARAS, MD;  [facility];  Service: Cardiovascular;  Laterality: Left;   REMOVAL OF PENILE PROSTHESIS N/A 06/14/2021   Procedure: Removal of THREE piece inflatable penile prosthesis;  Surgeon: Carolee Sherwood JONETTA DOUGLAS, MD;  Location: [facility] OR;  Service: Urology;  Laterality: N/A;   SHOULDER ARTHROSCOPY     SPINE SURGERY     TEMPORARY PACEMAKER N/A 02/13/2024   Procedure: TEMPORARY PACEMAKER;  Surgeon: Elmira Newman PARAS, MD;  [facility];  Service: Cardiovascular;  Laterality: N/A;   TOE AMPUTATION Left    TONSILLECTOMY     TOTAL KNEE ARTHROPLASTY  07/2002   RIGHT KNEE ; SURGEON  DR.  [name] ALSO HAD ARTHROSCOPIC RIGHT KNEE IN  10/2001   TOTAL KNEE ARTHROPLASTY     ULNAR NERVE TRANSPOSITION Right 10/21/2013   Procedure: RIGHT ELBOW  ULNAR NERVE DECOMPRESSION;  Surgeon: Arley JONELLE Curia, MD;  Location: [facility];  Service: Orthopedics;  Laterality: Right;    Family History: Family History  Problem Relation Age of Onset   Heart disease Father        Before age 51-  CAD, BPG   Diabetes Father        Amputation   Cancer Father  PROSTATE   Hyperlipidemia Father    Hypertension Father    Heart attack Father        Triple BPG   Varicose Veins Father    Cancer Sister        Breast   Hyperlipidemia Sister    Hypertension Sister    Heart attack Brother    Colon cancer Brother    Diabetes Brother    Heart disease Brother 61       A-Fib. Before age 70   Hyperlipidemia Brother    Hypertension Brother    Hypertension Son    Arthritis Other        GRANDMOTHER   Hypertension Other        OTHER FAMILY MEMBERS    Social History  reports that he has been smoking cigarettes. He has a 35 pack-year smoking history. He has never used smokeless tobacco. He reports that he does not currently use alcohol . He reports that he does not use drugs.  Allergies[1]  Medications  Current Medications[2]  Vitals   Vitals:   08/27/24 0715 08/27/24 0748 08/27/24 0800 08/27/24 1046  BP: 113/70  (!) 141/66   Pulse: (!) 102  (!) 110   Resp: 19  18   Temp: 98.8 F (37.1 C)  99 F (37.2 C)   TempSrc:   Bladder   SpO2: 100% 97% 97% 100%  Weight:      Height:        Body mass index is 35.1 kg/m.   Physical Exam   Constitutional: NAD  Psych: Sedated  Eyes: No scleral injection.  HENT: ETT in place Head: Normocephalic.  Cardiovascular: Normal rate and regular rhythm.  Respiratory: Mechanically ventilated  GI: Soft.  No distension. There is no tenderness.  Skin: WDI.   Neuro: Mental Status: Patient is sedated and intubated.  Unresponsive, no verbal  output Following does not follow commands Cranial Nerves: II: PERRL III,IV, VI: Eyes midline, tracks examiner VII: Facial movement is obscured by ETT VIII: Hearing is intact to voice X: Cough and gag intact XI: Head is midline XII: Tongue protrudes midline without atrophy or fasciculations.  Motor: Tone is normal. Bulk is normal. Moves all extremities against gravity.  No focal weakness appreciated Sensory: Localizes to painful stimuli.  Cerebellar: Unable to perform  Labs/Imaging/Neurodiagnostic studies   CBC:  Recent Labs  Lab 2024/09/05 1415 09-05-24 1421 08/26/24 1301 08/27/24 0927  WBC 6.3  --  10.5 13.4*  NEUTROABS 3.8  --   --   --   HGB 11.5*   < > 11.9* 12.1*  HCT 38.0*   < > 37.4* 38.2*  MCV 89.8  --  87.2 87.6  PLT 116*  --  152 144*   < > = values in this interval not displayed.   Basic Metabolic Panel:  Lab Results  Component Value Date   NA 131 (L) 08/27/2024   K 5.5 (H) 08/27/2024   CO2 20 (L) 08/27/2024   GLUCOSE 150 (H) 08/27/2024   BUN 44 (H) 08/27/2024   CREATININE 6.07 (H) 08/27/2024   CALCIUM  8.6 (L) 08/27/2024   GFRNONAA 9 (L) 08/27/2024   GFRAA 16 (L) 05/30/2020   Lipid Panel:  Lab Results  Component Value Date   LDLCALC 54 03/31/2023   HgbA1c:  Lab Results  Component Value Date   HGBA1C 6.4 (H) 08/12/2024   Urine Drug Screen:     Component Value Date/Time   LABOPIA NEGATIVE 08/26/2024 1303   COCAINSCRNUR NEGATIVE 08/26/2024 1303  LABBENZ NEGATIVE 08/26/2024 1303   AMPHETMU NEGATIVE 08/26/2024 1303   THCU NEGATIVE 08/26/2024 1303   LABBARB NEGATIVE 08/26/2024 1303    Alcohol  Level     Component Value Date/Time   ETH <10 05/28/2023 1243   INR  Lab Results  Component Value Date   INR 1.3 (H) 08/25/2024   APTT  Lab Results  Component Value Date   APTT 26 08/27/2024   AED levels: No results found for: PHENYTOIN, ZONISAMIDE, LAMOTRIGINE, LEVETIRACETA  Echocardiogram EF50-55%, left atria moderately  dilated  CT Head without contrast(Personally reviewed): No acute intracranial abnormality. 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 Mild periventricular white matter hypodensity, likely chronic ischemic change.  MRI Brain(Personally reviewed): No acute intracranial abnormality. Chronic encephalomalacia changes in the right parietal lobe and to a lesser extent the right frontal lobe. Moderately advanced cerebral white matter disease bilaterally. Curvilinear areas of hemosiderin staining in the left parietal and occipital lobes, as well as a couple of foci in the left frontal and right parietal lobes.  ASSESSMENT   [name] is a 68 y.o. male resenting unresponsive from dialysis.  He remains intubated at this time.  MRI shows no acute infarct but he does have hemosiderin staining in areas of old infarct.  This looks like he has had some hemorrhagic infarcts in the past likely due to his A-fib and uncontrolled risk factors.  Given his past medical history and risk factors he does need to be anticoagulated long-term.  He is currently on a heparin  drip and may titrate incision to a DOAC when medically appropriate.   RECOMMENDATIONS  - Recommend continue long-term anticoagulation given history of embolic strokes and chronic A-fib.  The hemosiderin and remote age blood changes on the MRI are likely due to hemorrhagic infarct and risk-benefit of anticoagulation is in favor of continuing it - HgbA1C and Lipid panel  - Further management of stroke risk factors by PCP  ______________________________________________________________________    Signed, Jorene Last, NP [facility]  I have personally obtained history,examined this patient, reviewed notes, independently viewed imaging studies, participated in medical decision making and plan of care.ROS completed by me personally and pertinent positives fully documented  I  have made any additions or clarifications directly to the above note. Agree with note above.  Patient with prior history of chronic atrial fibrillation and multiple strokes and TIAs presented for altered mental status and respiratory distress and was intubated.  No focal symptoms and history and MRI is negative for acute stroke but does show multiple old embolic strokes some of them with hemorrhagic transformation of remote age including hemosiderin staining.  The patient's risk benefits of continuing long-term anticoagulation for stroke prevention given his chronic A-fib is in favor of continuing it.  Continue IV heparin  for now and when patient is able to swallow resume Eliquis .  Aggressive risk factor modification.  No family available at the bedside for discussion.  Discussed with Dr. Ladona cardiology and critical care team.  Stroke team will sign off.  Can you call for questions.  This patient is critically ill and at significant risk of neurological worsening, death and care requires constant monitoring of vital signs, hemodynamics,respiratory and cardiac monitoring, extensive review of multiple databases, frequent neurological assessment, discussion with family, other specialists and medical decision making of high complexity.I have made any additions or clarifications directly to the above note.This critical care time does not reflect procedure time, or teaching time or supervisory  time of PA/NP/Med Resident etc but could involve care discussion time.  I spent 30 minutes of neurocritical care time  in the care of  this patient.      Eather Popp, MD Medical Director [facility] Pager: [phone] 08/27/2024 3:20 PM     [1]  Allergies Allergen Reactions   Contrast Media [Iodinated Contrast Media] Shortness Of Breath and Other (See Comments)    Altered mental status     Ivp Dye [Iodinated Contrast Media] Anaphylaxis, Shortness Of Breath and Other (See Comments)    Altered  mental status     Latex Rash    severe rash appears after the first 24 hours of being placed   Tape Rash and Other (See Comments)    Rash after 1 day of use  Do not leave on longer then 3 days without changing  [2]  Current Facility-Administered Medications:    albumin  human 25 % solution 25 g, 25 g, Intravenous, Q1H PRN, Geralynn Charleston, MD   alteplase  (CATHFLO ACTIVASE ) injection 2 mg, 2 mg, Intracatheter, Once PRN, Jerrye Katheryn BROCKS, MD   anticoagulant sodium citrate  solution 5 mL, 5 mL, Intracatheter, PRN, Jerrye Katheryn BROCKS, MD   ceFEPIme  (MAXIPIME ) 1 g in sodium chloride  0.9 % 100 mL IVPB, 1 g, Intravenous, Q24H, Billy Rocky SAUNDERS, RPH, Stopped at 08/26/24 2234   Chlorhexidine  Gluconate Cloth 2 % PADS 6 each, 6 each, Topical, Daily, Dagenhart, Jamie H, NP, 6 each at 08/27/24 0839   Chlorhexidine  Gluconate Cloth 2 % PADS 6 each, 6 each, Topical, Q0600, Jerrye Katheryn BROCKS, MD, 6 each at 08/27/24 [phone]   dexmedetomidine  (PRECEDEX ) 400 MCG/100ML (4 mcg/mL) infusion, 0-0.7 mcg/kg/hr, Intravenous, Titrated, Mohammed, Shahid, MD, Last Rate: 12.4 mL/hr at 08/27/24 1110, 0.4 mcg/kg/hr at 08/27/24 1110   doxercalciferol  (HECTOROL ) injection 2 mcg, 2 mcg, Intravenous, Q T,Th,Sa-HD, Jerrye Katheryn BROCKS, MD   fentaNYL  (SUBLIMAZE ) bolus via infusion 25-100 mcg, 25-100 mcg, Intravenous, Q15 min PRN, Dagenhart, Jamie H, NP   fentaNYL  in NS (16mcg/ml) infusion-PREMIX, 0-200 mcg/hr, Intravenous, Continuous, Dagenhart, Jamie H, NP, Held at 08/25/24 2147   heparin  ADULT infusion 100 units/mL (25000 units/250mL), 1,800 Units/hr, Intravenous, Continuous, Tanda Powell ORN, RPH, Last Rate: 18 mL/hr at 08/27/24 1045, 1,800 Units/hr at 08/27/24 1045   heparin  injection 1,000 Units, 1,000 Units, Intracatheter, PRN, Jerrye Katheryn BROCKS, MD   insulin  aspart (novoLOG ) injection 0-6 Units, 0-6 Units, Subcutaneous, Q4H, Dagenhart, Jamie H, NP   lidocaine  (PF) (XYLOCAINE ) 1 % injection 5 mL, 5 mL, Intradermal, PRN, Jerrye Katheryn BROCKS, MD   lidocaine -prilocaine  (EMLA ) cream 1 Application, 1 Application, Topical, PRN, Foster, [name] C, MD   multivitamin (RENA-VIT) tablet 1 tablet, 1 tablet, Per Tube, QHS, Mohammed, Shahid, MD, 1 tablet at 08/26/24 2158   mupirocin  ointment (BACTROBAN ) 2 % 1 Application, 1 Application, Nasal, BID, Layman Raisin, DO, 1 Application at 08/27/24 9161   norepinephrine  (LEVOPHED ) 4mg  in (0.016 mg/mL) premix infusion, 0-40 mcg/min, Intravenous, Titrated, Dagenhart, Jamie H, NP, Stopped at 08/27/24 9478   Oral care mouth rinse, 15 mL, Mouth Rinse, Q2H, Marshall, Jessica, DO, 15 mL at 08/27/24 0907   Oral care mouth rinse, 15 mL, Mouth Rinse, PRN, Layman Raisin, DO   pantoprazole  (PROTONIX ) injection 40 mg, 40 mg, Intravenous, QHS, Dagenhart, Jamie H, NP, 40 mg at 08/26/24 2157   pentafluoroprop-tetrafluoroeth (GEBAUERS) aerosol 1 Application, 1 Application, Topical, PRN, Foster, [name] C, MD   polyethylene glycol (MIRALAX  / GLYCOLAX ) packet 17 g, 17 g, Per Tube,  Daily PRN, Dagenhart, Jamie H, NP   vancomycin  variable dose per unstable renal function (pharmacist dosing), , Does not apply, See admin instructions, Billy Rocky SAUNDERS, [facility]

## 2024-08-27 NOTE — Progress Notes (Signed)
 eLink Physician-Brief Progress Note Patient Name: STEPHENS SHREVE DOB: [birth year] MRN: [id number]   Date of Service  08/27/2024  HPI/Events of Note  Patient needs restraints ordered to prevent self-extubation.  eICU Interventions  Restraints ordered.        Najma Bozarth U Terell Kincy 08/27/2024, 10:27 PM

## 2024-08-27 NOTE — Progress Notes (Signed)
 [facility] Progress Note  Subjective:  Seen in ICU Remains on the ventilator Looks like pressors were weaned off yesterday  Presentation summary: 68 y.o. year-old w/ PMH as below who presented to ED yesterday afternoon sent from dialysis.  Patient was dialyzed for 1 hour and then went staff went to check on him he was minimally responsive.  His prior dialysis was a 4-week ago and the wife said he had 20 kg on.  EMS came and gave DuoNeb, he was 91% on room room air and 95 after nebs.  Was hospitalized 2 weeks ago for pneumonia.  In the ED patient remained unresponsive with labored breathing and pinpoint pupils.  A fentanyl  patch was found applied to his chest and he received Narcan  and had a good response.  Then he went into A-fib with RVR and remained encephalopathic despite additional Narcan  so was subsequently intubated for airway support.  IV Levophed  was started for hypotension with blood pressures in the 70s and significant volume overload was noted.  Per the H&P the wife initially made patient a DNR-L to the ED provider over the phone, however then she changed her mind to make the patient a full code.  Per the ICU note the wife is understanding of his serious comorbid disease and overall poor health and would likely consider stopping prolonged resuscitative efforts if outcome appears futile.   Critical care team admitted to ICU.  We are asked to see for ESRD.   Vitals:   08/27/24 1046 08/27/24 1100 08/27/24 1109 08/27/24 1200  BP:  130/88  (!) 92/56  Pulse:  (!) 121 (!) 119 (!) 113  Resp:  (!) 27 (!) 24 19  Temp:  99.7 F (37.6 C) 99.7 F (37.6 C) 99.9 F (37.7 C)  TempSrc:      SpO2: 100% 93% 96% 98%  Weight:      Height:        Exam: Gen on vent, sedated Sclera anicteric, throat w/ ETT +JVD Chest clear anterior/ lateral RRR no MRG Abd soft ntnd no mass or ascites +bs GU deferred Ext 2+ bilat BKA and hip edema Neuro is on vent, sedated    LUA aVF+bruit      Home bp meds: Norvasc     [facility] HD: [facility] From early dec 2025 -> 4h  B450   108kg AVF   Heparin  none      Assessment/ Plan: AMS: felt to be opiate intoxication w/ underlying ESRD w/ noncompliance/ missed dialysis as well. Was given narcan  in ED. Had HD yesterday w/ 2.4 L net removed. On vent / sedated.   ESRD: on HD TTS. Had HD here Wednesday. Next HD Friday off schedule.  Shock: getting IV cefepime / vanc abx, remains off pressors.  Volume: 7kg up by weights, diffuse LE edema on exam, initial CXR w/ vasc congestion. Plan max UF w/ HD tomorrow, w/ IV alb prn.  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 Inpatient medications:  Chlorhexidine  Gluconate Cloth  6 each Topical Daily   Chlorhexidine  Gluconate Cloth  6 each Topical Q0600   doxercalciferol   2 mcg Intravenous Q T,Th,Sa-HD   insulin  aspart  0-6 Units Subcutaneous Q4H   multivitamin  1 tablet Per Tube QHS   mupirocin  ointment  1 Application Nasal BID   mouth rinse  15 mL Mouth Rinse Q2H   pantoprazole  (PROTONIX ) IV  40 mg Intravenous QHS   vancomycin  variable dose per unstable renal function (pharmacist dosing)   Does not apply See admin instructions    albumin  human     anticoagulant sodium citrate      ceFEPime  (MAXIPIME ) IV Stopped (08/26/24 2234)   dexmedetomidine  (PRECEDEX ) IV infusion 0.4 mcg/kg/hr (08/27/24 1110)   fentaNYL  infusion INTRAVENOUS Stopped (08/25/24 2147)   heparin  1,800 Units/hr (08/27/24 1045)   norepinephrine  (LEVOPHED ) Adult infusion Stopped (08/27/24 0521)   albumin  human, alteplase , anticoagulant sodium citrate , fentaNYL , heparin , lidocaine  (PF),  lidocaine -prilocaine , mouth rinse, pentafluoroprop-tetrafluoroeth, polyethylene glycol

## 2024-08-27 NOTE — Progress Notes (Signed)
 NAME:  [name], MRN:  [id number], DOB:  07-18-1956, LOS: 2 ADMISSION DATE:  08/25/2024, CHIEF COMPLAINT: Altered mental status  History of Present Illness:  [name] is a 68 year old male who resides at [facility], with past medical history significant for CAD s/p stents on Plavix , NSTEMI s/p in-stent restenosis to RCA; severe stenosis in Lcx s/p DES 11/24/23, ESRD on HD TTS, PAD s/p bilateral BKS, COPD, DM2, HTN, Afib on Eliquis  who presented to the ED after being found unresponsive during dialysis earlier today. Patient was reportedly one hour into dialysis when he was found unresponsive. In the ED, he remained unresponsive with labored breathing and pinpoint pupils. A fentanyl  patch was found applied to his chest and he was given 2mg  Narcan  for which he then aroused. Of note, varying reports of 1 vs 2 fentanyl  patch(es) found on patient, however cannot confirm at this time. Patient reportedly was then in Afib RVR, started on a Diltiazem  gtt, he remained encephalopathic despite additional Narcan  and was subsequently intubated. Nephrology was consulted with plans to resume dialysis 12/17. PCCM consulted for ICU admission.   On exam in the ED, patient hypotensive with manual BP 70s/50s, severe volume overload, will start Levo. Labs notable for elevated troponin/pro-BNP and severe metabolic derangements c/w ESRD. Work-up for metabolic, infectious, cardiac etiology ongoing.   Wife at bedside, initially had made patient DNR-L to the ED provider over the phone, however now would like to make patient Full Code and states he has expressed wanting full resuscitation should he cardiac arrest. Wife is understanding of his significant co-morbidities and overall poor health and would likely consider stopping prolonged resuscitative efforts if outcome appears futile.     Pertinent  Medical History   Past Medical History:  Diagnosis Date   Carotid artery occlusion 11/10/2010   LEFT CAROTID  ENDARTERECTOMY   Complication of anesthesia    BP WENT UP AT [facility]    COPD (chronic obstructive pulmonary disease) (HCC)    pt denies this dx as of 06/01/20 - no inhaler    Diabetes mellitus without complication (HCC)    Diverticulitis    Diverticulosis of colon (without mention of hemorrhage)    DJD (degenerative joint disease)    knees/hands/feet/back/neck   ESRD (end stage renal disease) on dialysis (HCC)    Fatty liver    Full dentures    GERD (gastroesophageal reflux disease)    H/O hiatal hernia    History of blood transfusion    with a past surical procedure per patient 06/01/20   History of right below knee amputation (HCC) 07/12/2016   Hyperlipidemia    Hypertension    Neuromuscular disorder (HCC)    peripheral neuropathy   Non-pressure chronic ulcer of other part of left foot limited to breakdown of skin (HCC) 11/12/2016   Nonrheumatic aortic (valve) stenosis 03/18/2024   Limited TTE 03/18/2024: EF 55-60, moderate LVH, normal RVSF, moderate LAE, mild MR, mild-moderate AS (mean 12, V-max 235 cm/s, DI 0.35), aortic root 39 mm, moderate pleural effusion, trivial pericardial effusion    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 Atherectomy left popliteal artery July 2012.    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  Interim History / Subjective:  Patient sedation has been off.  He is on pressure support  ventilation he has been on it overnight.  He got a little bit of propofol  but he is MRI.  He is still not awake.  Not following commands.  Objective    Blood pressure (!) 141/66, pulse (!) 110, temperature 99 F (37.2 C), temperature source Bladder, resp. rate 18, height 6' 2 (1.88 m), weight 124 kg, SpO2 100%.    Vent Mode: PSV;CPAP FiO2 (%):  [40 %] 40 % PEEP:  [5 cmH20] 5 cmH20 Pressure Support:  [8 cmH20] 8 cmH20 Plateau Pressure:  [14 cmH20] 14 cmH20   Intake/Output Summary (Last 24 hours) at 08/27/2024 1138 Last data filed at 08/27/2024 0700 Gross per 24 hour  Intake 798.55 ml  Output 2855 ml  Net -2056.45 ml   Filed Weights   08/26/24 1245 08/26/24 1715 08/27/24 0324  Weight: 122.7 kg 120.3 kg 124 kg    Examination:   Physical exam:   Physical exam: General: Crtitically ill-appearing male, orally intubated HEENT: Onward/AT, eyes anicteric.  ETT and cortrak in place Neuro: Sedation has been off not following commands.  Eyes are closed.  Pupils 3 mm bilateral reactive to light Chest: Coarse breath sounds, no wheezes or rhonchi Heart: Regular rate and rhythm, no murmurs or gallops Abdomen: Soft, nondistended, bowel sounds present Extremities: Bilateral BKA  Resolved problem list   Assessment and Plan   Acute toxic-metabolic encephalopathy likely secondary to opiate intoxication with underlying ESRD ESRD on HD TTS-wife reports hx of non-compliance Restless Leg Syndrome -Patient found to be unresponsive w/ 1 vs 2 transdermal 50mg  Fentanyl  patch(es) applied; aroused after receiving 2mg  Narcan . Unable to confirm at this time exact number of fentanyl  patches. -Nephrology following; HD was done on 08/26/2024 next HD will be tomorrow. -Trend CMP -Replace electrolytes as indicated -Hold home sevelemer while NPO -Hold requip ; restart once extubated -CTH no acute intracranial abnormality -MRI brain shows chronic encephalomalacia and no acute issues.   PAD in setting of  mechanical intubation Chronic pain; prescribed 50mg  Fentanyl  patch q72 hours; per wife on med for 15+ years; also on Percocet  7.5-325 mg q8 PRN - Off sedation now.  We will stop fentanyl  and propofol .  Will use Precedex  as needed. -Optimize MMPR -Hold home Gabapentin  w/ acute encephalopathy; reassess with neurological improvement - Continue pressure support ventilation as long as he tolerates. -Attempt extubation when he is more alert.  Will reevaluate today or tomorrow..   Acute hypoxic respiratory failure likely secondary to opioid-induced respiratory depression and volume overload in setting of ESRD C/f pneumonia; recently treated for CAP 08/11/24 COPD OSA -Intubated in ED -LPV; Wean ventilator to maintain SpO2 >90% -CAP coverage with cefepime /vanc; stop vanc if MRSA nares negative -VAP bundle -Bronchopulmonary hygiene; duonebs PRN -ABG/ CXR PRN -SBT daily -Will attempt to extubate today or tomorrow based on his alertness.   Chronic Afib with acute onset RVR; on Eliquis ; follows cards outpt- prev on Amio stopped d/t tachy-brady syndrome req TPM-poor PPM candidate; CAD s/p stents (2016); with elevated troponin; on Plavix  NSTEMI 11/24/23 s/p in-stent restenosis to RCA; severe stenosis in Lcx s/p  DES HFpEF-TTE 02/17/24 EF 50-55% Pericardial effusion s/p pericardiocentesis 02/2024 Hypertension PAD s/p bilateral BKA Post-intubation/medication-related hypotension HLD Elevated troponin likely type II myocardial infarction with demand ischemia -Initially started on diltiazem  gtt for afib RVR; became hypotensive; gtt stopped; rate has since been controlled -Wean vasopressors to maintain MAP >65 & SBP >90 -Heparin  GTT while intubated will start home dose Eliquis  when extubated -Troponin 183; pBNP >35k -Transthoracic echo -EF is 50 to 55%.  No acute issues. -Restart Lipitor  -Hold home antihypertensives   T2DM -A1c 6.4 (08/12/24) -CBG q4 +SSI -Hold home insulin    GI ppx: Famotidine     FEN -No mIVF in setting of volume overload -NPO; OGT to liws; start TF if unable to extubate in morning   On Heparin  gtt  Goals of Care Discussion: I called the patient's wife and had a discussion with her.  I updated her on the patient's current status.  She understands the risk of reintubation he is extubated today.  Patient's cardiologist Dr.Ganji who is very well-known to the patient and his wife spoke with her also.  He recommended her to go back to hospice out which she has been on before.  At this stage she made him DNR.  She will just give him 1 more day on the ventilator as she is leaning towards not  reintubated eventually.  I offered palliative care consult.  At this stage she is not ready to talk to them.  She said she would like to communicate with me if anything changes.  For now he is DNR and we will attempt extubation tomorrow after dialysis.  Labs   CBC: Recent Labs  Lab 08/25/24 1415 08/25/24 1421 08/25/24 1538 08/26/24 1301 08/27/24 0927  WBC 6.3  --   --  10.5 13.4*  NEUTROABS 3.8  --   --   --   --   HGB 11.5* 12.6* 12.9* 11.9* 12.1*  HCT 38.0* 37.0* 38.0* 37.4* 38.2*  MCV 89.8  --   --  87.2 87.6  PLT 116*  --   --  152 144*    Basic Metabolic Panel: Recent Labs  Lab 08/25/24 1415 08/25/24 1421 08/25/24 1538 08/25/24 2032 08/26/24 1301 08/27/24 0927  NA 137 135 136 134* 134* 131*  K 5.4* 5.1 5.2* 5.6* 5.6* 5.5*  CL 98  --  100 95* 94* 91*  CO2 23  --   --  21* 20* 20*  GLUCOSE 125*  --  121* 138* 132* 150*  BUN 53*  --  60* 58* 66* 44*  CREATININE 6.63*  --  7.30* 7.16* 7.96* 6.07*  CALCIUM  7.9*  --   --  8.2* 8.0* 8.6*  MG  --   --   --  1.9 1.8 1.9  PHOS  --   --   --  7.1* 7.8* 6.7*   GFR: Estimated Creatinine Clearance: 15.8 mL/min (A) (by C-G formula based on SCr of 6.07 mg/dL (H)). Recent Labs  Lab 08/25/24 1415 08/25/24 2040 08/26/24 1301 08/27/24 0927  WBC 6.3  --  10.5 13.4*  LATICACIDVEN  --  1.5  --   --     Liver Function  Tests: Recent Labs  Lab 08/25/24 2032  AST 38  ALT 19  ALKPHOS 135*  BILITOT 0.5  PROT 6.4*  ALBUMIN  3.7   No results for input(s): LIPASE, AMYLASE in the last 168 hours. No results for input(s): AMMONIA in the last 168 hours.  ABG    Component Value Date/Time  PHART 7.364 02/13/2024 1550   PCO2ART 36.7 02/13/2024 1550   PO2ART 112 (H) 02/13/2024 1550   HCO3 25.7 08/25/2024 1421   TCO2 23 08/25/2024 1538   ACIDBASEDEF 4.0 (H) 02/13/2024 1550   O2SAT 78 08/25/2024 1421     Coagulation Profile: Recent Labs  Lab 08/25/24 2032  INR 1.3*    Cardiac Enzymes: No results for input(s): CKTOTAL, CKMB, CKMBINDEX, TROPONINI in the last 168 hours.  HbA1C: Hgb A1c MFr Bld  Date/Time Value Ref Range Status  08/12/2024 02:14 AM 6.4 (H) 4.8 - 5.6 % Final    Comment:    (NOTE)         Prediabetes: 5.7 - 6.4         Diabetes: >6.4         Glycemic control for adults with diabetes: <7.0   02/01/2024 11:19 AM 5.4 4.8 - 5.6 % Final    Comment:    (NOTE) Pre diabetes:          5.7%-6.4%  Diabetes:              >6.4%  Glycemic control for   <7.0% adults with diabetes     CBG: Recent Labs  Lab 08/26/24 1927 08/26/24 2317 08/27/24 0323 08/27/24 0742 08/27/24 1134  GLUCAP 122* 123* 127* 140* 155*    Review of Systems:   12 point review system could not be done as the patient is sedated.  Past Medical History:  He,  has a past medical history of Carotid artery occlusion (11/10/2010), Complication of anesthesia, COPD (chronic obstructive pulmonary disease) (HCC), Diabetes mellitus without complication (HCC), Diverticulitis, Diverticulosis of colon (without mention of hemorrhage), DJD (degenerative joint disease), ESRD (end stage renal disease) on dialysis (HCC), Fatty liver, Full dentures, GERD (gastroesophageal reflux disease), H/O hiatal hernia, History of blood transfusion, History of right below knee amputation (HCC) (07/12/2016), Hyperlipidemia,  Hypertension, Neuromuscular disorder (HCC), Non-pressure chronic ulcer of other part of left foot limited to breakdown of skin (HCC) (11/12/2016), Nonrheumatic aortic (valve) stenosis (03/18/2024), Osteomyelitis (HCC), PAD (peripheral artery disease), Pseudoclaudication (11/15/2018), S/P BKA (below knee amputation), right (HCC) (09/18/2018), Sleep apnea, Slurred speech, Status post reversal of ileostomy (05/21/2018), 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   Surgical History:   Past Surgical History:  Procedure Laterality Date   ABDOMINAL AORTOGRAM W/LOWER EXTREMITY N/A 06/23/2021   Procedure: ABDOMINAL AORTOGRAM W/LOWER EXTREMITY;  Surgeon: Elmira Newman PARAS, MD;  Location: [facility];  Service: Cardiovascular;  Laterality: N/A;   AMPUTATION  11/05/2011   Procedure: AMPUTATION RAY;  Surgeon: Norleen Armor, MD;  Location: [facility] OR;  Service: Orthopedics;  Laterality: Right;  Amputation of Right 4&5th Toes   AMPUTATION Left 11/26/2012   Procedure: AMPUTATION RAY;  Surgeon: Norleen Armor, MD;  Location: [facility] OR;  Service: Orthopedics;  Laterality: Left;  fourth ray amputation   AMPUTATION Right 08/27/2014   Procedure: Transmetatarsal Amputation;  Surgeon: Jerona Harden GAILS, MD;  Location: [facility] OR;  Service: Orthopedics;  Laterality: Right;   AMPUTATION Right 01/14/2015   Procedure: AMPUTATION BELOW KNEE;  Surgeon: Jerona Harden GAILS, MD;  Location: [facility] OR;  Service: Orthopedics;  Laterality: Right;   AMPUTATION Left 10/21/2015   Procedure: Left Foot 5th Ray Amputation;  Surgeon: Jerona Harden GAILS, MD;  Location: [facility] OR;  Service: Orthopedics;  Laterality: Left;   AMPUTATION Left 07/04/2022   Procedure: LEFT BELOW KNEE AMPUTATION;  Surgeon: Harden Jerona GAILS, MD;  Location: [facility] OR;  Service: Orthopedics;  Laterality: Left;  ANTERIOR FUSION CERVICAL SPINE  02/06/2006   C4-5, C5-6, C6-7; SURGEON DR. MAX COHEN   AV FISTULA PLACEMENT Left 06/02/2020   Procedure:  ARTERIOVENOUS (AV) FISTULA CREATION LEFT;  Surgeon: Sheree Penne Bruckner, MD;  Location: [facility] OR;  Service: Vascular;  Laterality: Left;   BACK SURGERY     x 3   BASCILIC VEIN TRANSPOSITION Left 07/21/2020   Procedure: LEFT UPPER ARM ATERIOVENOUS SUPERFISTULALIZATION;  Surgeon: Sheree Penne Bruckner, MD;  Location: [facility] OR;  Service: Vascular;  Laterality: Left;   BELOW KNEE LEG AMPUTATION Right    CARDIAC CATHETERIZATION  10/31/2004   2009   CAROTID ENDARTERECTOMY  11/10/2010   CAROTID ENDARTERECTOMY Left 11/10/2010   Subtotal occlusion of left internal carotid artery with left hemispheric transient ischemic attacks.   CAROTID STENT     CARPAL TUNNEL RELEASE Right 10/21/2013   Procedure: RIGHT CARPAL TUNNEL RELEASE;  Surgeon: Arley JONELLE Curia, MD;  Location: [facility];  Service: Orthopedics;  Laterality: Right;   CHOLECYSTECTOMY     COLON SURGERY     COLONOSCOPY     COLOSTOMY REVERSAL  05/21/2018   ileostomy reversal   CORONARY LITHOTRIPSY N/A 11/24/2023   Procedure: CORONARY LITHOTRIPSY;  Surgeon: Jordan, Peter M, MD;  Location: [facility] INVASIVE CV LAB;  Service: Cardiovascular;  Laterality: N/A;   CORONARY STENT INTERVENTION N/A 11/24/2023   Procedure: CORONARY STENT INTERVENTION;  Surgeon: Jordan, Peter M, MD;  Location: [facility] INVASIVE CV LAB;  Service: Cardiovascular;  Laterality: N/A;   CYSTOSCOPY WITH STENT PLACEMENT Bilateral 01/13/2018   Procedure: CYSTOSCOPY WITH BILATERAL URETERAL CATHETER PLACEMENT;  Surgeon: Cam Morene ORN, MD;  Location: [facility] ORS;  Service: Urology;  Laterality: Bilateral;   ESOPHAGEAL MANOMETRY Bilateral 07/19/2014   Procedure: ESOPHAGEAL MANOMETRY (EM);  Surgeon: Gordy CHRISTELLA Starch, MD;  Location: [facility];  Service: Gastroenterology;  Laterality: Bilateral;   EYE SURGERY Bilateral 2020   cataract   FEMORAL ARTERY STENT     x6   FINGER SURGERY     FOOT SURGERY  04/25/2016    EXCISION BASE 5TH METATARSAL AND PARTIAL CUBOID LEFT FOOT   HERNIA  REPAIR     LEFT INGUINAL AND UMBILICAL REPAIRS   HERNIA REPAIR     I & D EXTREMITY Left 04/25/2016   Procedure: EXCISION BASE 5TH METATARSAL AND PARTIAL CUBOID LEFT FOOT;  Surgeon: Jerona LULLA Sage, MD;  Location: [facility] OR;  Service: Orthopedics;  Laterality: Left;   ILEOSTOMY  01/13/2018   Procedure: ILEOSTOMY;  Surgeon: Signe Mitzie LABOR, MD;  Location: [facility] ORS;  Service: General;;   ILEOSTOMY CLOSURE N/A 05/21/2018   Procedure: ILEOSTOMY REVERSAL ERAS PATHWAY;  Surgeon: Signe Mitzie LABOR, MD;  Location: [facility] OR;  Service: General;  Laterality: N/A;   IR RADIOLOGIST EVAL & MGMT  11/19/2017   IR RADIOLOGIST EVAL & MGMT  12/03/2017   IR RADIOLOGIST EVAL & MGMT  12/18/2017   JOINT REPLACEMENT Right 2001   Total knee   LAMINECTOMY     X 3 LUMBAR AND X 2 CERVICAL SPINE OPERATIONS   LAPAROSCOPIC CHOLECYSTECTOMY W/ CHOLANGIOGRAPHY  11/09/2004   SURGEON DR. DEWARD CANDIE NULL   LEFT HEART CATH AND CORONARY ANGIOGRAPHY N/A 09/16/2018   Procedure: LEFT HEART CATH AND CORONARY ANGIOGRAPHY;  Surgeon: Elmira Newman PARAS, MD;  [facility];  Service: Cardiovascular;  Laterality: N/A;   LEFT HEART CATH AND CORONARY ANGIOGRAPHY N/A 11/24/2023   Procedure: LEFT HEART CATH AND CORONARY ANGIOGRAPHY;  Surgeon: Jordan, Peter M, MD;  Location: [facility]  INVASIVE CV LAB;  Service: Cardiovascular;  Laterality: N/A;   LEFT HEART CATHETERIZATION WITH CORONARY ANGIOGRAM N/A 10/29/2014   Procedure: LEFT HEART CATHETERIZATION WITH CORONARY ANGIOGRAM;  Surgeon: Erick JONELLE Bergamo, MD;  Location: [facility] CATH LAB;  Service: Cardiovascular;  Laterality: N/A;   LIGATION OF COMPETING BRANCHES OF ARTERIOVENOUS FISTULA Left 07/21/2020   Procedure: LIGATION OF COMPETING BRANCHES OF LEFT UPPER ARM ARTERIOVENOUS FISTULA;  Surgeon: Sheree Penne Bruckner, MD;  Location: [facility] OR;  Service: Vascular;  Laterality: Left;   LOWER EXTREMITY ANGIOGRAM N/A 03/19/2012   Procedure: LOWER EXTREMITY ANGIOGRAM;  Surgeon: Bruckner JONETTA Cash, MD;   Location: [facility] CATH LAB;  Service: Cardiovascular;  Laterality: N/A;   LOWER EXTREMITY ANGIOGRAPHY N/A 06/20/2021   Procedure: LOWER EXTREMITY ANGIOGRAPHY;  Surgeon: Elmira Newman PARAS, MD;  [facility];  Service: Cardiovascular;  Laterality: N/A;   NECK SURGERY     PARTIAL COLECTOMY N/A 01/13/2018   Procedure: LAPAROSCOPIC ASSISTED   SIGMOID COLECTOMY ILEOSTOMY;  Surgeon: Signe Mitzie LABOR, MD;  Location: [facility] ORS;  Service: General;  Laterality: N/A;   PENILE PROSTHESIS IMPLANT  08/14/2005   INFRAPUBIC INSERTION OF INFLATABLE PENILE PROSTHESIS; SURGEON DR. JANIT   PENILE PROSTHESIS IMPLANT     PERCUTANEOUS CORONARY STENT INTERVENTION (PCI-S) Right 10/29/2014   Procedure: PERCUTANEOUS CORONARY STENT INTERVENTION (PCI-S);  Surgeon: Erick JONELLE Bergamo, MD;  Location: [facility] CATH LAB;  Service: Cardiovascular;  Laterality: Right;   PERICARDIOCENTESIS N/A 02/13/2024   Procedure: PERICARDIOCENTESIS;  Surgeon: Elmira Newman PARAS, MD;  [facility];  Service: Cardiovascular;  Laterality: N/A;   PERIPHERAL VASCULAR INTERVENTION Left 06/23/2021   Procedure: PERIPHERAL VASCULAR INTERVENTION;  Surgeon: Elmira Newman PARAS, MD;  [facility];  Service: Cardiovascular;  Laterality: Left;   REMOVAL OF PENILE PROSTHESIS N/A 06/14/2021   Procedure: Removal of THREE piece inflatable penile prosthesis;  Surgeon: Carolee Sherwood JONETTA DOUGLAS, MD;  Location: [facility] OR;  Service: Urology;  Laterality: N/A;   SHOULDER ARTHROSCOPY     SPINE SURGERY     TEMPORARY PACEMAKER N/A 02/13/2024   Procedure: TEMPORARY PACEMAKER;  Surgeon: Elmira Newman PARAS, MD;  Location: [facility];  Service: Cardiovascular;  Laterality: N/A;   TOE AMPUTATION Left    TONSILLECTOMY     TOTAL KNEE ARTHROPLASTY  07/2002   RIGHT KNEE ; SURGEON  DR. [name] ALSO HAD ARTHROSCOPIC RIGHT KNEE IN  10/2001   TOTAL KNEE ARTHROPLASTY     ULNAR NERVE TRANSPOSITION Right 10/21/2013   Procedure: RIGHT ELBOW   ULNAR NERVE DECOMPRESSION;  Surgeon: Arley JONELLE Curia, MD;  Location: [facility];  Service: Orthopedics;  Laterality: Right;     Social History:   reports that he has been smoking cigarettes. He has a 35 pack-year smoking history. He has never used smokeless tobacco. He reports that he does not currently use alcohol . He reports that he does not use drugs.   Family History:  His family history includes Arthritis in an other family member; Cancer in his father and sister; Colon cancer in his brother; Diabetes in his brother and father; Heart attack in his brother and father; Heart disease in his father; Heart disease (age of onset: 63) in his brother; Hyperlipidemia in his brother, father, and sister; Hypertension in his brother, father, sister, son, and another family member; Varicose Veins in his father.   Allergies Allergies[1]   Home Medications  Prior to Admission medications  Medication Sig Start Date End Date Taking? Authorizing Provider  amLODipine  (NORVASC ) 10 MG tablet Take 5 mg by mouth daily.    [provider]  apixaban  (ELIQUIS ) 5 MG TABS tablet Take 1 tablet (5 mg total) by mouth 2 (two) times daily. 01/14/22 09/09/24  Gonfa, Taye T, MD  atorvastatin  (LIPITOR ) 80 MG tablet Take 1 tablet (80 mg total) by mouth at bedtime. 11/25/23   Amoako, Prince, MD  B Complex-C-Zn-Folic Acid  (DIALYVITE /ZINC ) TABS Take 1 tablet by mouth See admin instructions. Give 1 tablet by mouth 3 days a week prior to dialysis on Tuesday, Thursday, Saturday. 10/04/21   [provider]  clopidogrel  (PLAVIX ) 75 MG tablet Take 1 tablet (75 mg total) by mouth daily with breakfast. Patient taking differently: Take 75 mg by mouth daily. 11/25/23 11/24/24  Amoako, Prince, MD  colchicine  0.6 MG tablet Take 0.5 tablets (0.3 mg total) by mouth daily. 02/21/24   Gonfa, Taye T, MD  famotidine  (PEPCID ) 20 MG tablet Take 20 mg by mouth See admin instructions. Give 1 tablet (20mg ) by mouth every other  morning.    [provider]  fentaNYL  (DURAGESIC ) 50 MCG/HR Place 1 patch onto the skin every 3 (three) days. 08/12/24   Raenelle Coria, MD  folic acid  (FOLVITE ) 1 MG tablet Take 1 mg by mouth daily. 09/18/21   [provider]  gabapentin  (NEURONTIN ) 100 MG capsule Take 100 mg by mouth 3 (three) times daily.    [provider]  insulin  aspart (NOVOLOG  FLEXPEN) 100 UNIT/ML FlexPen Inject 4-16 Units into the skin See admin instructions. Inject 4 units subcutaneously three times daily plus 0-12 units per sliding scale: 70 - 150 : 0 units 151-200 : 2 units 201-250 : 4 units 251-300 : 6 units 301-350 : 8 units 351-400 : 10 units 401-450 : 12 units > 400 : give 12 units and recheck in 1 hour - if still > 400, call MD/NP for further orders.    [provider]  ipratropium-albuterol  (DUONEB) 0.5-2.5 (3) MG/3ML SOLN  05/26/24   [provider]  isosorbide  mononitrate (IMDUR ) 60 MG 24 hr tablet Take 60 mg by mouth daily. Give 60mg  by mouth one time a day    [provider]  lidocaine -prilocaine  (EMLA ) cream Apply 1 application  topically See admin instructions. Apply topically 3 days a week prior to dialysis on Tuesday, Thursday, Saturday. 06/06/21   [provider]  loratadine  (CLARITIN ) 10 MG tablet Take 10 mg by mouth daily.    [provider]  mineral oil-hydrophilic petrolatum (AQUAPHOR) ointment Apply 1 Application topically every 8 (eight) hours as needed for irritation or dry skin.    [provider]  nitroGLYCERIN  (NITROSTAT ) 0.4 MG SL tablet Place 0.4 mg under the tongue every 5 (five) minutes as needed for chest pain. 01/06/22   [provider]  oxyCODONE -acetaminophen  (PERCOCET ) 7.5-325 MG tablet Take 1 tablet by mouth every 8 (eight) hours as needed for severe pain (pain score 7-10). 08/12/24   Raenelle Coria, MD  promethazine  (PHENERGAN ) 12.5 MG suppository Place 12.5 mg rectally daily as needed for nausea or  vomiting.    [provider]  rOPINIRole  (REQUIP ) 2 MG tablet Take 2 mg by mouth at bedtime.    [provider]  sevelamer  carbonate (RENVELA ) 800 MG tablet Take 2 tablets (1,600 mg total) by mouth 3 (three) times daily with meals. 02/04/24   Amin, Sumayya, MD  tamsulosin  (FLOMAX ) 0.4 MG CAPS capsule Take 0.4 mg by mouth daily.    [provider]  Critical care time: 40 Minutes.     Tamela Stakes, MD  Attending Physician, Critical Care Medicine [facility] See Amion for pager If no response to pager, please call [phone] until 7pm After 7pm, Please call E-link [phone]           [1]  Allergies Allergen Reactions   Contrast Media [Iodinated Contrast Media] Shortness Of Breath and Other (See Comments)    Altered mental status     Ivp Dye [Iodinated Contrast Media] Anaphylaxis, Shortness Of Breath and Other (See Comments)    Altered mental status     Latex Rash    severe rash appears after the first 24 hours of being placed   Tape Rash and Other (See Comments)    Rash after 1 day of use  Do not leave on longer then 3 days without changing

## 2024-08-27 NOTE — Progress Notes (Signed)
 PHARMACY - ANTICOAGULATION CONSULT NOTE  Pharmacy Consult for Heparin  (Eliquis  on hold) Indication: atrial fibrillation  Allergies[1]  Patient Measurements: Height: 6' 2 (188 cm) Weight: 124 kg (273 lb 5.9 oz) IBW/kg (Calculated) : 82.2 HEPARIN  DW (KG): 109.1  Vital Signs: Temp: 99.1 F (37.3 C) (12/18 1700) Temp Source: Bladder (12/18 1600) BP: 103/72 (12/18 1700) Pulse Rate: 69 (12/18 1700)  Labs: Recent Labs    08/25/24 1415 08/25/24 1421 08/25/24 1538 08/25/24 2032 08/26/24 1017 08/26/24 1301 08/26/24 2128 08/27/24 0927 08/27/24 1855  HGB 11.5*   < > 12.9*  --   --  11.9*  --  12.1*  --   HCT 38.0*   < > 38.0*  --   --  37.4*  --  38.2*  --   PLT 116*  --   --   --   --  152  --  144*  --   APTT  --   --   --  31  --  35  --  26  --   LABPROT  --   --   --  16.9*  --   --   --   --   --   INR  --   --   --  1.3*  --   --   --   --   --   HEPARINUNFRC  --   --   --  <0.10*   < >  --  <0.10* <0.10* <0.10*  CREATININE 6.63*  --  7.30* 7.16*  --  7.96*  --  6.07*  --    < > = values in this interval not displayed.    Estimated Creatinine Clearance: 15.8 mL/min (A) (by C-G formula based on SCr of 6.07 mg/dL (H)).  Assessment: 68 yo male on eliquis  for hx afib. Pharmacy consulted to dose IV heparin  while intubated/NPO. Last dose of eliquis  not documented, but none administered inpatient, so likely >12hrs ago PTA.  Heparin  level remains undetectable while on heparin  1800 units/hr. HgB 12.1 and PLTs 144. Confirmed again with RN, heparin  infusion running without issues, no known interruptions.    Goal of Therapy:  Heparin  level 0.3-0.7 units/ml aPTT 66-102 seconds Monitor platelets by anticoagulation protocol: Yes   Plan:   Re-bolus with 2000 units x1 (thrombocytopenia improved) Increase heparin  infusion to 2000 units/hr Check heparin  level in 8 hours and daily while on heparin  Continue to monitor H&H and platelets  Thank you for allowing pharmacy to be a part  of this patients care.  Shelba Collier, PharmD, BCPS Clinical Pharmacist    [1]  Allergies Allergen Reactions   Contrast Media [Iodinated Contrast Media] Shortness Of Breath and Other (See Comments)    Altered mental status     Ivp Dye [Iodinated Contrast Media] Anaphylaxis, Shortness Of Breath and Other (See Comments)    Altered mental status     Latex Rash    severe rash appears after the first 24 hours of being placed   Tape Rash and Other (See Comments)    Rash after 1 day of use  Do not leave on longer then 3 days without changing

## 2024-08-27 NOTE — Consult Note (Signed)
 Cardiology Consultation   Patient ID: [name] MRN: [id number]; DOB: [birth year]  Admit date: 08/25/2024 Date of Consult: 08/27/2024  PCP:  Montey Lot, PA-C   [facility] Providers Cardiologist:  Gordy Bergamo, MD        Patient Profile:   [name] is a 68 y.o. male with a hx of CAD, PAD, PAF who is being seen 08/27/2024 for the evaluation of altered mental status and atrial fibrillation at the request of Dr. Tamela Stakes, [facility].  History of Present Illness:   [name] is a 68 y.o.  Caucasian male patient with coronary artery disease with history of PCI and stenting to RCA in 2016, presenting with NSTEMI on 11/24/2023 underwent cardiac catheterization and had in-stent restenosis to RCA and severe stenosis in LCx, LCx was treated with DES, ISR versus partially successful with inability to expand the stents completely due to severe calcification in the RCA.    He also presented with marked bradycardia and hypotension and pericardial effusion with tamponade physiology, amiodarone  was discontinued in June 2025 and carvedilol  also discontinued due to tachybradycardia syndrome, patient felt not to be a good candidate for pacemaker implantation.  He is admitted to the hospital with altered mental status and narcotic overdose, hypoxemia and respiratory failure after he was found unresponsive during dialysis.  Upon giving Narcan , patient woke up and it appears that the fentanyl  patches that he was wearing could have caused him the altered mental status and encephalopathy.  However patient continued to remain encephalopathic and needed intubation and is presently on a ventilator, date of admission 08/25/2024.  He has continued to remain unresponsive and I was asked to help with discussions regarding atrial fibrillation management and also end-of-life issues as I have known the patient for many years and also his wife.  Patient is presently intubated and I  cannot obtain any history but he did wake up and responded appropriately when I called out his name, also requested that ET tube be taken out and wanted to see his wife.   Past medical history and past surgical history reviewed.    Home Medications:  Prior to Admission medications  Medication Sig Start Date End Date Taking? Authorizing Provider  amLODipine  (NORVASC ) 5 MG tablet Take 5 mg by mouth daily.   Yes [provider]  atorvastatin  (LIPITOR ) 80 MG tablet Take 1 tablet (80 mg total) by mouth at bedtime. 11/25/23  Yes Amoako, Prince, MD  B Complex-C-Zn-Folic Acid  (DIALYVITE /ZINC ) TABS Take 1 tablet by mouth See admin instructions. Give 1 tablet by mouth 3 days a week prior to dialysis on Tuesday, Thursday, Saturday. 10/04/21  Yes [provider]  colchicine  0.6 MG tablet Take 0.5 tablets (0.3 mg total) by mouth daily. Patient taking differently: Take 0.3 mg by mouth at bedtime. 02/21/24  Yes Gonfa, Taye T, MD  Doxercalciferol  (HECTOROL  IV) Inject 2 mcg into the vein Every Tuesday,Thursday,and Saturday with dialysis. Medication is provided and administered by [facility]; Hectorol  IV 2mcg x3 week on Tuesday, Thursday and Saturday - during dialysis treatment.   Yes [provider]  famotidine  (PEPCID ) 20 MG tablet Take 20 mg by mouth See admin instructions. Give 1 tablet (20mg ) by mouth every other morning.   Yes [provider]  fentaNYL  (DURAGESIC ) 50 MCG/HR Place 1 patch onto the skin every 3 (three) days. 08/12/24  Yes Raenelle Coria, MD  ferrous sulfate  325 (65 FE) MG tablet Take 325 mg by mouth in  the morning and at bedtime.   Yes [provider]  folic acid  (FOLVITE ) 1 MG tablet Take 1 mg by mouth daily. 09/18/21  Yes [provider]  gabapentin  (NEURONTIN ) 100 MG capsule Take 100 mg by mouth 3 (three) times daily.   Yes [provider]  insulin  aspart (NOVOLOG  FLEXPEN) 100 UNIT/ML FlexPen Inject 4-16 Units into the  skin See admin instructions. Inject 4 units subcutaneously three times daily before meals, in ADDITION to 0-12 units per sliding scale: 70 - 150 : 0 units 151-200 : 2 units 201-250 : 4 units 251-300 : 6 units 301-350 : 8 units 351-400 : 10 units 401-450 : 12 units > 400 : give 12 units and recheck in 1 hour - if still > 400, call MD/NP for further orders.   Yes [provider]  ipratropium-albuterol  (DUONEB) 0.5-2.5 (3) MG/3ML SOLN Take 3 mLs by nebulization every 6 (six) hours as needed (wheezing, shortness of breath). 05/26/24  Yes [provider]  Iron Sucrose  (VENOFER  IV) Inject 100 mg into the vein Every Tuesday,Thursday,and Saturday with dialysis. Medication is provided and administered by [facility]; Venofer  IV 50 mg x3 week on Tuesday, Thursday and Saturday - during dialysis treatment.   Yes [provider]  isosorbide  mononitrate (IMDUR ) 60 MG 24 hr tablet Take 60 mg by mouth daily. Give 60mg  by mouth one time a day   Yes [provider]  mineral oil-hydrophilic petrolatum (AQUAPHOR) ointment Apply 1 Application topically every 8 (eight) hours as needed for dry skin.   Yes [provider]  nitroGLYCERIN  (NITROSTAT ) 0.4 MG SL tablet Place 0.4 mg under the tongue every 5 (five) minutes as needed for chest pain. 01/06/22  Yes [provider]  Nystatin ([name]'S BUTT CREAM) CREA Apply 1 Application topically in the morning and at bedtime.   Yes [provider]  oxyCODONE -acetaminophen  (PERCOCET /ROXICET) 5-325 MG tablet Take 1 tablet by mouth every 8 (eight) hours as needed for severe pain (pain score 7-10).   Yes [provider]  Phenylephrine  HCl (PREPARATION H) 0.25 % SUPP Place 1 suppository rectally every 6 (six) hours as needed (hemorrhoids).   Yes [provider]  rOPINIRole  (REQUIP ) 2 MG tablet Take 2 mg by mouth at bedtime.   Yes [provider]  senna (SENOKOT) 8.6 MG TABS tablet  Take 17.2 mg by mouth at bedtime.   Yes [provider]  sevelamer  carbonate (RENVELA ) 800 MG tablet Take 2 tablets (1,600 mg total) by mouth 3 (three) times daily with meals. Patient taking differently: Take 800-1,600 mg by mouth See admin instructions. Give 1 tablet (800mg ) by mouth before meals for hypocalcemia take 2 tablets (1600mg ) by mouth 3 times daily with meals. 02/04/24  Yes Caleen Qualia, MD  sodium zirconium cyclosilicate  (LOKELMA ) 10 g PACK packet Take 10 g by mouth See admin instructions. Give 1 packet (10g) by mouth 4 days a week in the morning on Monday, Wednesday, Friday, Sunday.   Yes [provider]  tamsulosin  (FLOMAX ) 0.4 MG CAPS capsule Take 0.4 mg by mouth daily.   Yes [provider]  apixaban  (ELIQUIS ) 5 MG TABS tablet Take 1 tablet (5 mg total) by mouth 2 (two) times daily. 01/14/22 09/09/24  Gonfa, Taye T, MD    Inpatient Medications: Scheduled Meds:  Chlorhexidine  Gluconate Cloth  6 each Topical Daily   Chlorhexidine  Gluconate Cloth  6 each Topical Q0600   doxercalciferol   2 mcg Intravenous Q T,Th,Sa-HD   insulin  aspart  0-6 Units Subcutaneous Q4H   multivitamin  1 tablet Per Tube QHS   mupirocin  ointment  1 Application Nasal BID   mouth rinse  15 mL Mouth Rinse Q2H   pantoprazole  (PROTONIX ) IV  40 mg Intravenous QHS   vancomycin  variable dose per unstable renal function (pharmacist dosing)   Does not apply See admin instructions   Continuous Infusions:  albumin  human     anticoagulant sodium citrate      ceFEPime  (MAXIPIME ) IV Stopped (08/26/24 2234)   dexmedetomidine  (PRECEDEX ) IV infusion 0.4 mcg/kg/hr (08/27/24 1110)   fentaNYL  infusion INTRAVENOUS Stopped (08/25/24 2147)   heparin  1,800 Units/hr (08/27/24 1045)   norepinephrine  (LEVOPHED ) Adult infusion Stopped (08/27/24 0521)   PRN Meds: albumin  human, alteplase , anticoagulant sodium citrate , fentaNYL , heparin , lidocaine  (PF), lidocaine -prilocaine , mouth rinse,  pentafluoroprop-tetrafluoroeth, polyethylene glycol  Allergies:   Allergies[1]  Social History:   Social History   Tobacco Use   Smoking status: Every Day    Current packs/day: 1.00    Average packs/day: 1 pack/day for 35.0 years (35.0 ttl pk-yrs)    Types: Cigarettes   Smokeless tobacco: Never  Substance Use Topics   Alcohol  use: Not Currently    Comment: not in a long time  Married    ROS:  Review of Systems  Unable to perform ROS: Intubated   Physical Exam    Vitals:   08/27/24 0715 08/27/24 0748 08/27/24 0800 08/27/24 1046  BP: 113/70  (!) 141/66   Pulse: (!) 102  (!) 110   Resp: 19  18   Temp: 98.8 F (37.1 C)  99 F (37.2 C)   TempSrc:   Bladder   SpO2: 100% 97% 97% 100%  Weight:      Height:       Physical Exam Constitutional:      Appearance: He is obese.     Interventions: He is intubated.  Neck:     Vascular: Carotid bruit (Bilateral) present.  Cardiovascular:     Rate and Rhythm: Tachycardia present. Rhythm irregular.     Heart sounds: Heart sounds are distant. Murmur heard.     Midsystolic murmur is present with a grade of 2/6 at the upper right sternal border.  Pulmonary:     Effort: He is intubated.     Breath sounds: No rales.  Abdominal:     General: Abdomen is flat. Bowel sounds are normal.     Palpations: Abdomen is soft.  Genitourinary:    Comments: Drainage tube from the pubic area noted.  Postsurgical Staples  in the suprapubic region noted. Musculoskeletal:        General: Deformity (Bilateral BKA) present.     Cervical back: Neck supple.      Intake/Output Summary (Last 24 hours) at 08/27/2024 1213 Last data filed at 08/27/2024 0700 Gross per 24 hour  Intake 786.13 ml  Output 2855 ml  Net -2068.87 ml      08/27/2024    3:24 AM 08/26/2024    5:15 PM 08/26/2024   12:45 PM  Last 3 Weights  Weight (lbs) 273 lb 5.9 oz 265 lb 3.4 oz 270 lb 8.1 oz  Weight (kg) 124 kg 120.3 kg 122.7 kg     Net IO Since Admission: -1,217.47  mL [08/27/24 1213]  Labs   Lab Results  Component Value Date   NA 131 (L) 08/27/2024   K 5.5 (H) 08/27/2024   CO2 20 (L) 08/27/2024   GLUCOSE 150 (H) 08/27/2024   BUN 44 (H) 08/27/2024  CREATININE 6.07 (H) 08/27/2024   CALCIUM  8.6 (L) 08/27/2024   GFR 85.57 03/11/2012   GFRNONAA 9 (L) 08/27/2024       Latest Ref Rng & Units 08/27/2024    9:27 AM 08/26/2024    1:01 PM 08/25/2024    8:32 PM  BMP  Glucose 70 - 99 mg/dL 849  867  861   BUN 8 - 23 mg/dL 44  66  58   Creatinine 0.61 - 1.24 mg/dL 3.92  2.03  2.83   Sodium 135 - 145 mmol/L 131  134  134   Potassium 3.5 - 5.1 mmol/L 5.5  5.6  5.6   Chloride 98 - 111 mmol/L 91  94  95   CO2 22 - 32 mmol/L 20  20  21    Calcium  8.9 - 10.3 mg/dL 8.6  8.0  8.2        Latest Ref Rng & Units 08/27/2024    9:27 AM 08/26/2024    1:01 PM 08/25/2024    3:38 PM  CBC  WBC 4.0 - 10.5 K/uL 13.4  10.5    Hemoglobin 13.0 - 17.0 g/dL 87.8  88.0  87.0   Hematocrit 39.0 - 52.0 % 38.2  37.4  38.0   Platelets 150 - 400 K/uL 144  152      Lab Results  Component Value Date   CHOL 104 03/31/2023   HDL 23 (L) 03/31/2023   LDLCALC 54 03/31/2023   TRIG 135 03/31/2023   CHOLHDL 4.5 03/31/2023    Lab Results  Component Value Date   TSH 0.771 05/28/2023    Lab Results  Component Value Date   HGBA1C 6.4 (H) 08/12/2024  ProBNP (last 3 results) Recent Labs    08/25/24 1415  PROBNP >35,000.0*    DDimer No results for input(s): DDIMER in the last 168 hours.  Tele/EKG/Cardiac studies    EKG:  EKG 08/25/2024: Atrial fibrillation with rapid ventricular response at the rate of 135 bpm, left anterior fascicular block, right bundle branch block.  Compared to 08/12/2024, A-fib is new.  Previously trifascicular block.  Telemetry: 08/27/2024: A-fib with RVR.  ECHOCARDIOGRAM COMPLETE 08/26/2024  1. Left ventricular ejection fraction, by estimation, is 50 to 55%. The left ventricle has low normal function. The left ventricular internal  cavity size was mildly dilated. Left ventricular diastolic parameters are indeterminate. 2. Right ventricular systolic function is moderately reduced. The right ventricular size is moderately enlarged. There is mildly elevated pulmonary artery systolic pressure. The estimated right ventricular systolic pressure is 37.3 mmHg. 3. Left atrial size was moderately dilated. 4. Right atrial size was moderately dilated. 5. The mitral valve is degenerative. Mild mitral valve regurgitation. 6. Dimensionless index 0.26. The aortic valve is tricuspid. There is severe calcifcation of the aortic valve. Moderate to severe aortic valve stenosis. Aortic valve area, by VTI measures 1.22 cm. Aortic valve mean gradient measures 23.8 mmHg. Aortic valve Vmax measures 3.16 m/s. 7. Aortic dilatation noted. There is borderline dilatation of the ascending aorta, measuring 38 mm.  Radiology  Imaging results have been reviewed  Assessment & Plan .     1.  Paroxysmal A-fib with RVR 2.  Encephalopathy, metabolic and narcotic induced, accidental.  There is question whether more than 1 patch had been left in place prior to changing the patch on a daily basis of the fentanyl . 3.  End-stage renal disease on hemodialysis 4.  CAD and PAD 5.  Abnormal MRI  Recommendations: Previously beta-blocker dose initially reduced and  then discontinued as he was having frequent episodes of bradycardia and sinus arrest, felt not to be a good candidate for pacemaker implantation in view of high risk for sepsis.  Hence we will continue to watch for now without making any changes to his medications.  Presently on IV heparin , Eliquis  has been held.  I reviewed the MRI, patient has hemosiderin pigments that are kind of diffuse and wonder if this is prior old embolic infarct with hemorrhagic conversion versus amyloid angiopathy which makes anticoagulation a contraindication.  Hence I would like to get opinion from neurology, consult  placed.  From CAD and PAD standpoint he has remained stable.  Although his BNP was markedly elevated, he has planned dialysis today.  With regard to his CODE STATUS, I had a long discussion with Katheryn, his wife who I know well, after long discussion, she agrees to make him DNR, as patient responded well to me today, it would be appropriate to continue supportive care for now.  I also discussed with pulmonary critical care, to dialyze him today and potentially get him off of sedation and try to extubate and see how he does.  If mental status does not improve, may be consider not to reintubate.  Patient's wife was very appreciative of my call.  I will continue to follow on the sidelines and will certainly follow-up with neurology opinion regarding continued anticoagulation versus contraindication. Click Here to Calculate/Change CHADS2VASc Score The patient's CHADS2-VASc score is  , indicating a  % annual risk of stroke.  Therefore, anticoagulation is recommended.        Gordy Bergamo, MD, [facility] 08/27/2024, 12:13 PM [facility] [address] [place], [place] [address] Phone: [phone]. Fax:  [phone]      [1]  Allergies Allergen Reactions   Contrast Media [Iodinated Contrast Media] Shortness Of Breath and Other (See Comments)    Altered mental status     Ivp Dye [Iodinated Contrast Media] Anaphylaxis, Shortness Of Breath and Other (See Comments)    Altered mental status     Latex Rash    severe rash appears after the first 24 hours of being placed   Tape Rash and Other (See Comments)    Rash after 1 day of use  Do not leave on longer then 3 days without changing

## 2024-08-27 NOTE — Progress Notes (Signed)
 Patient transported to MRI and back to 4N19 without event.  Patient suctioned upon return, HOB elevated 30 degrees per VAP protocol.

## 2024-08-27 NOTE — Progress Notes (Addendum)
 PHARMACY - ANTICOAGULATION CONSULT NOTE  Pharmacy Consult for Heparin  (Eliquis  on hold) Indication: atrial fibrillation  Allergies[1]  Patient Measurements: Height: 6' 2 (188 cm) Weight: 124 kg (273 lb 5.9 oz) IBW/kg (Calculated) : 82.2 HEPARIN  DW (KG): 109.1  Vital Signs: Temp: 98.8 F (37.1 C) (12/18 0700) BP: 113/70 (12/18 0715) Pulse Rate: 103 (12/18 0700)  Labs: Recent Labs    08/25/24 1415 08/25/24 1421 08/25/24 1538 08/25/24 2032 08/26/24 1017 08/26/24 1301 08/26/24 2128  HGB 11.5* 12.6* 12.9*  --   --  11.9*  --   HCT 38.0* 37.0* 38.0*  --   --  37.4*  --   PLT 116*  --   --   --   --  152  --   APTT  --   --   --  31  --  35  --   LABPROT  --   --   --  16.9*  --   --   --   INR  --   --   --  1.3*  --   --   --   HEPARINUNFRC  --   --   --  <0.10* <0.10*  --  <0.10*  CREATININE 6.63*  --  7.30* 7.16*  --  7.96*  --     Estimated Creatinine Clearance: 12.1 mL/min (A) (by C-G formula based on SCr of 7.96 mg/dL (H)).   Medical History: Past Medical History:  Diagnosis Date   Carotid artery occlusion 11/10/2010   LEFT CAROTID ENDARTERECTOMY   Complication of anesthesia    BP WENT UP AT [facility]    COPD (chronic obstructive pulmonary disease) (HCC)    pt denies this dx as of 06/01/20 - no inhaler    Diabetes mellitus without complication (HCC)    Diverticulitis    Diverticulosis of colon (without mention of hemorrhage)    DJD (degenerative joint disease)    knees/hands/feet/back/neck   ESRD (end stage renal disease) on dialysis (HCC)    Fatty liver    Full dentures    GERD (gastroesophageal reflux disease)    H/O hiatal hernia    History of blood transfusion    with a past surical procedure per patient 06/01/20   History of right below knee amputation (HCC) 07/12/2016   Hyperlipidemia    Hypertension    Neuromuscular disorder (HCC)    peripheral neuropathy   Non-pressure chronic ulcer of other part of left foot limited to breakdown of skin (HCC)  11/12/2016   Nonrheumatic aortic (valve) stenosis 03/18/2024   Limited TTE 03/18/2024: EF 55-60, moderate LVH, normal RVSF, moderate LAE, mild MR, mild-moderate AS (mean 12, V-max 235 cm/s, DI 0.35), aortic root 39 mm, moderate pleural effusion, trivial pericardial effusion    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 Atherectomy left popliteal artery July 2012.    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   Unstable angina (HCC) 09/16/2018   Wears glasses     Medications:  Scheduled:   Chlorhexidine  Gluconate Cloth  6 each Topical Daily   Chlorhexidine  Gluconate Cloth  6 each Topical Q0600   doxercalciferol   2 mcg Intravenous  Q T,Th,Sa-HD   insulin  aspart  0-6 Units Subcutaneous Q4H   multivitamin  1 tablet Per Tube QHS   mupirocin  ointment  1 Application Nasal BID   mouth rinse  15 mL Mouth Rinse Q2H   pantoprazole  (PROTONIX ) IV  40 mg Intravenous QHS   vancomycin  variable dose per unstable renal function (pharmacist dosing)   Does not apply See admin instructions   Infusions:   albumin  human     anticoagulant sodium citrate      ceFEPime  (MAXIPIME ) IV Stopped (08/26/24 2234)   dexmedetomidine  (PRECEDEX ) IV infusion     fentaNYL  infusion INTRAVENOUS Stopped (08/25/24 2147)   heparin  1,650 Units/hr (08/27/24 0700)   norepinephrine  (LEVOPHED ) Adult infusion Stopped (08/27/24 0521)   PRN: albumin  human, alteplase , anticoagulant sodium citrate , fentaNYL , heparin , lidocaine  (PF), lidocaine -prilocaine , mouth rinse, pentafluoroprop-tetrafluoroeth, polyethylene glycol  Assessment: 68 yo male on chronic eliquis  for hx afib. Pharmacy consulted to dose IV heparin  while intubated/NPO. Last dose of  eliquis  not documented, but none administered inpatient, so likely >12hrs ago PTA.  Heparin  level remains undetectable while on heparin  1650. HgB 12.1 and PLTs 144. No issues noted with infusion.   Goal of Therapy:  Heparin  level 0.3-0.7 units/ml aPTT 66-102 seconds Monitor platelets by anticoagulation protocol: Yes   Plan:  Thrombocytopenia improved, will give small bolus of 2000u.  Increase Heparin  1800 units/hr 8 hour heparin  level and CBC. Continue to monitor H&H and platelets D/c APTT checks.   Powell Blush, PharmD, BCCCP  Clinical Pharmacist  08/27/2024 8:35 AM   [facility] pharmacy phone numbers are listed on [email or web address]           [1]  Allergies Allergen Reactions   Contrast Media [Iodinated Contrast Media] Shortness Of Breath and Other (See Comments)    Altered mental status     Ivp Dye [Iodinated Contrast Media] Anaphylaxis, Shortness Of Breath and Other (See Comments)    Altered mental status     Latex Rash    severe rash appears after the first 24 hours of being placed   Tape Rash and Other (See Comments)    Rash after 1 day of use  Do not leave on longer then 3 days without changing

## 2024-08-27 NOTE — TOC Initial Note (Signed)
 Transition of Care [facility]) - Initial/Assessment Note    Patient Details  Name: [name] MRN: [id number] Date of Birth: [birth year]  Transition of Care [facility] [place]) CM/SW Contact:    Eloyse Causey E Nitika Jackowski, LCSW Phone Number: 08/27/2024, 1:00 PM  Clinical Narrative:                 Patient was admitted after becoming unresponsive at his dialysis treatment. Patient is a long term care resident at [facility]- CSW spoke with Leontine in Admissions who confirmed patient can return when medically ready.   Expected Discharge Plan: Skilled Nursing Facility Barriers to Discharge: Continued Medical Work up   Patient Goals and CMS Choice            Expected Discharge Plan and Services       Living arrangements for the past 2 months: Skilled Nursing Facility                                      Prior Living Arrangements/Services Living arrangements for the past 2 months: Skilled Nursing Facility Lives with:: Facility Resident Patient language and need for interpreter reviewed:: Yes        Need for Family Participation in Patient Care: Yes (Comment) Care giver support system in place?: Yes (comment)   Criminal Activity/Legal Involvement Pertinent to Current Situation/Hospitalization: No - Comment as needed  Activities of Daily Living      Permission Sought/Granted                  Emotional Assessment         Alcohol  / Substance Use: Not Applicable Psych Involvement: No (comment)  Admission diagnosis:  Atrial fibrillation with RVR (HCC) [I48.91] ESRD on hemodialysis (HCC) [N18.6, Z99.2] Opiate overdose, undetermined intent, initial encounter (HCC) [T40.604A] Altered mental status, unspecified altered mental status type [R41.82] AMS (altered mental status) [R41.82] Patient Active Problem List   Diagnosis Date Noted   AMS (altered mental status) 08/25/2024   Volume overload 08/11/2024   Non compliance with medical treatment 03/28/2024   Decubitus ulcer of  sacral region, stage 2 (HCC) 03/25/2024   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/2024   Obesity, Class I, BMI 30-34.9 03/25/2024   Nonrheumatic aortic (valve) stenosis 03/18/2024   History of Clostridioides difficile colitis 02/12/2024   BPH (benign prostatic hyperplasia) 02/12/2024   ESRD (end stage renal disease) on dialysis (HCC) 07/19/2023   History of right coronary artery stent placement 03/31/2023   Current use of long term anticoagulation 03/31/2023   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) 03/31/2023   Paroxysmal atrial fibrillation (HCC) 03/31/2023   Anemia due to chronic kidney disease 01/13/2022   Obesity (BMI 30-39.9) 01/13/2022   Proliferative diabetic retinopathy of left eye with macular edema associated with type 2 diabetes mellitus (HCC) 11/27/2021   Paroxysmal atrial fibrillation with RVR (HCC) 06/14/2021   Carotid stenosis 06/15/2020   Dyslipidemia 06/15/2020   Diabetic macular edema of right eye with proliferative retinopathy associated with type 2 diabetes mellitus (HCC) 01/18/2020   Severe nonproliferative diabetic retinopathy of left eye, with macular edema, associated with type 2 diabetes mellitus (HCC) 01/04/2020   Retinal hemorrhage of right eye 01/04/2020   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   S/P carotid endarterectomy 09/18/2018   OSA not tolerating CPAP 09/18/2018   Chronic pain 09/18/2018   Peripheral artery disease 11/12/2016   Unilateral primary osteoarthritis, left knee 10/11/2016  Type 2 diabetes mellitus with diabetic neuropathy (HCC) 05/09/2016   CAD (dz of distal, mid and proximal RCA with implantation of 3 overlapping drug-eluting stent, with elevated troponin 09/03/2015   (HFpEF) heart failure with preserved ejection fraction (HCC) 09/03/2015   Hyperlipidemia 08/25/2014   Limb pain 03/20/2013   Restless legs syndrome (RLS) 09/25/2012   COPD (chronic obstructive pulmonary disease) (HCC) 04/25/2012   Hypokalemia  11/01/2011   Occlusion and stenosis of carotid artery without mention of cerebral infarction 06/12/2011   GERD (gastroesophageal reflux disease) 05/08/2011   Barrett's esophagus without dysplasia 05/08/2011   Former tobacco use 02/01/2011   PCP:  Montey Lot, PA-C Pharmacy:   [id number] [place], [place] - [address] AT [address] [place] [address] Phone: [phone] Fax: [phone]  [id number] - [place], [place] - [address] [place] [address] Phone: [phone] Fax: [phone]  [facility] - [place], [place] - [address] [place] [address] Phone: [phone] Fax: [phone]     Social Drivers of Health (SDOH) Social History: SDOH Screenings   Food Insecurity: No Food Insecurity (03/25/2024)  Housing: Low Risk (03/25/2024)  Transportation Needs: No Transportation Needs (03/25/2024)  Utilities: Not At Risk (03/25/2024)  Social Connections: Moderately Isolated (03/25/2024)  Tobacco Use: High Risk (08/11/2024)   SDOH Interventions:     Readmission Risk Interventions    08/12/2024    1:17 PM 05/29/2023    4:41 PM  Readmission Risk Prevention Plan  Transportation Screening Complete Complete  Medication Review Oceanographer) Complete Complete  PCP or Specialist appointment within 3-5 days of discharge  Complete  HRI or Home Care Consult Complete Complete  SW Recovery Care/Counseling Consult  Complete  Palliative Care Screening Not Applicable Not Applicable  Skilled Nursing Facility Not Applicable Complete

## 2024-08-27 NOTE — Progress Notes (Signed)
 eLink Physician-Brief Progress Note Patient Name: DAVARIUS RIDENER DOB: August 04, 1956 MRN: [id number]   Date of Service  08/27/2024  HPI/Events of Note  Patient needs order for PRN bolus iv sedation medications for trip to MRI.  eICU Interventions  PRN  iv Fentanyl  order entered.        Lenyx Boody U Zora Glendenning 08/27/2024, 1:50 AM

## 2024-08-28 LAB — PHOSPHORUS: Phosphorus: 8 mg/dL — ABNORMAL HIGH (ref 2.5–4.6)

## 2024-08-28 LAB — HEPARIN LEVEL (UNFRACTIONATED)
Heparin Unfractionated: <0.1 [IU]/mL — ABNORMAL LOW (ref 0.30–0.70)
Heparin Unfractionated: 0.25 [IU]/mL — ABNORMAL LOW (ref 0.30–0.70)

## 2024-08-28 LAB — CBC
HCT: 38.9 % — ABNORMAL LOW (ref 39.0–52.0)
Hemoglobin: 12.2 g/dL — ABNORMAL LOW (ref 13.0–17.0)
MCH: 27.5 pg (ref 26.0–34.0)
MCHC: 31.4 g/dL (ref 30.0–36.0)
MCV: 87.6 fL (ref 80.0–100.0)
Platelets: 138 K/uL — ABNORMAL LOW (ref 150–400)
RBC: 4.44 MIL/uL (ref 4.22–5.81)
RDW: 17.3 % — ABNORMAL HIGH (ref 11.5–15.5)
WBC: 10.7 K/uL — ABNORMAL HIGH (ref 4.0–10.5)
nRBC: 0 % (ref 0.0–0.2)

## 2024-08-28 LAB — BASIC METABOLIC PANEL WITH GFR
Anion gap: 22 — ABNORMAL HIGH (ref 5–15)
BUN: 57 mg/dL — ABNORMAL HIGH (ref 8–23)
CO2: 18 mmol/L — ABNORMAL LOW (ref 22–32)
Calcium: 8.9 mg/dL (ref 8.9–10.3)
Chloride: 91 mmol/L — ABNORMAL LOW (ref 98–111)
Creatinine, Ser: 6.89 mg/dL — ABNORMAL HIGH (ref 0.61–1.24)
GFR, Estimated: 8 mL/min — ABNORMAL LOW
Glucose, Bld: 190 mg/dL — ABNORMAL HIGH (ref 70–99)
Potassium: 5.7 mmol/L — ABNORMAL HIGH (ref 3.5–5.1)
Sodium: 131 mmol/L — ABNORMAL LOW (ref 135–145)

## 2024-08-28 LAB — MAGNESIUM: Magnesium: 1.9 mg/dL (ref 1.7–2.4)

## 2024-08-28 LAB — HEMOGLOBIN A1C
Hgb A1c MFr Bld: 6.9 % — ABNORMAL HIGH (ref 4.8–5.6)
Mean Plasma Glucose: 151.33 mg/dL

## 2024-08-28 LAB — GLUCOSE, CAPILLARY
Glucose-Capillary: 149 mg/dL — ABNORMAL HIGH (ref 70–99)
Glucose-Capillary: 160 mg/dL — ABNORMAL HIGH (ref 70–99)
Glucose-Capillary: 140 mg/dL — ABNORMAL HIGH (ref 70–99)
Glucose-Capillary: 148 mg/dL — ABNORMAL HIGH (ref 70–99)
Glucose-Capillary: 146 mg/dL — ABNORMAL HIGH (ref 70–99)
Glucose-Capillary: 127 mg/dL — ABNORMAL HIGH (ref 70–99)

## 2024-08-28 LAB — APTT: aPTT: 73 s — ABNORMAL HIGH (ref 24–36)

## 2024-08-28 MED ORDER — DOXERCALCIFEROL 4 MCG/2ML IV SOLN
INTRAVENOUS | Status: AC
  Filled 2024-08-28: qty 2

## 2024-08-28 MED ORDER — POLYETHYLENE GLYCOL 3350 17 G PO PACK
17.0000 g | PACK | Freq: Every day | ORAL | Status: DC
  Administered 2024-08-28: 17 g
  Filled 2024-08-28 (×2): qty 1

## 2024-08-28 MED ORDER — NEPRO/CARBSTEADY PO LIQD: 237.0000 mL | ORAL | Status: DC | PRN

## 2024-08-28 MED ORDER — HEPARIN BOLUS VIA INFUSION
2000.0000 [IU] | Freq: Once | INTRAVENOUS | Status: AC
  Administered 2024-08-28: 2000 [IU] via INTRAVENOUS
  Filled 2024-08-28: qty 2000

## 2024-08-28 MED ORDER — SENNOSIDES-DOCUSATE SODIUM 8.6-50 MG PO TABS
1.0000 | ORAL_TABLET | Freq: Two times a day (BID) | ORAL | Status: DC
  Administered 2024-08-28: 1
  Filled 2024-08-28 (×2): qty 1

## 2024-08-28 MED ORDER — PENTAFLUOROPROP-TETRAFLUOROETH EX AERO: 1.0000 | INHALATION_SPRAY | CUTANEOUS | Status: DC | PRN

## 2024-08-28 MED ORDER — SEVELAMER CARBONATE 800 MG PO TABS
800.0000 mg | ORAL_TABLET | Freq: Three times a day (TID) | ORAL | Status: DC
  Filled 2024-08-28 (×3): qty 1

## 2024-08-28 MED ORDER — LIDOCAINE HCL (PF) 1 % IJ SOLN: 5.0000 mL | INTRAMUSCULAR | Status: DC | PRN

## 2024-08-28 MED ORDER — ORAL CARE MOUTH RINSE: 15.0000 mL | OROMUCOSAL | Status: DC | PRN

## 2024-08-28 MED ORDER — HEPARIN SODIUM (PORCINE) 1000 UNIT/ML DIALYSIS: 1000.0000 [IU] | INTRAMUSCULAR | Status: DC | PRN

## 2024-08-28 MED ORDER — LIDOCAINE-PRILOCAINE 2.5-2.5 % EX CREA: 1.0000 | TOPICAL_CREAM | CUTANEOUS | Status: DC | PRN

## 2024-08-28 MED ORDER — ORAL CARE MOUTH RINSE
15.0000 mL | OROMUCOSAL | Status: DC
  Administered 2024-08-28 – 2024-09-02 (×14): 15 mL via OROMUCOSAL

## 2024-08-28 MED ORDER — ALBUMIN HUMAN 25 % IV SOLN: 25.0000 g | INTRAVENOUS | Status: DC | PRN

## 2024-08-28 MED ORDER — ANTICOAGULANT SODIUM CITRATE 4% (200MG/5ML) IV SOLN: 5.0000 mL | Status: DC | PRN

## 2024-08-28 NOTE — Progress Notes (Signed)
 [facility] Progress Note  Subjective:  Seen in ICU Remains on the ventilator Remains off pressors  Presentation summary: 68 y.o. year-old w/ PMH as below who presented to ED yesterday afternoon sent from dialysis.  Patient was dialyzed for 1 hour and then went staff went to check on him he was minimally responsive.  His prior dialysis was a 4-week ago and the wife said he had 20 kg on.  EMS came and gave DuoNeb, he was 91% on room room air and 95 after nebs.  Was hospitalized 2 weeks ago for pneumonia.  In the ED patient remained unresponsive with labored breathing and pinpoint pupils.  A fentanyl  patch was found applied to his chest and he received Narcan  and had a good response.  Then he went into A-fib with RVR and remained encephalopathic despite additional Narcan  so was subsequently intubated for airway support.  IV Levophed  was started for hypotension with blood pressures in the 70s and significant volume overload was noted.  Per the H&P the wife initially made patient a DNR-L to the ED provider over the phone, however then she changed her mind to make the patient a full code.  Per the ICU note the wife is understanding of his serious comorbid disease and overall poor health and would likely consider stopping prolonged resuscitative efforts if outcome appears futile.   Critical care team admitted to ICU.  We are asked to see for ESRD.   Vitals:   08/28/24 1145 08/28/24 1200 08/28/24 1215 08/28/24 1220  BP: 112/66 127/70 129/74 116/73  Pulse: 69 98 (!) 103   Resp: 14 (!) 28 (!) 27   Temp:      TempSrc:      SpO2: 100% 100% 100%   Weight:      Height:        Exam: Gen on vent, sedated Sclera anicteric, throat w/ ETT +JVD Chest clear anterior/ lateral RRR no MRG Abd soft ntnd no mass or ascites +bs GU deferred Ext 2+ bilat BKA and hip edema Neuro is on vent, sedated    LUA aVF+bruit     Home bp meds: Norvasc     [facility] HD: [facility] From early dec 2025 -> 4h   B450   108kg AVF   Heparin  none      Assessment/ Plan ESRD: on HD TTS. Had HD here Wednesday. Next HD today off schedule.  Shock: getting IV cefepime / vanc abx, remains off pressors.  Volume: 7kg up by weights, diffuse LE edema on exam, initial CXR w/ vasc congestion. Max UF w/ dialysis as tolerated, IV alb prn.  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 Was given narcan  in ED. Now sedated on vent Afib/ RVR: on IV diltiazem      Rob Geralynn MD  [facility] 08/28/2024, 12:36 PM  Recent Labs  Lab 08/25/24 2032 08/26/24 1301 08/27/24 0927 08/28/24 0550  HGB  --    < > 12.1* 12.2*  ALBUMIN  3.7  --   --   --   CALCIUM  8.2*   < > 8.6* 8.9  PHOS 7.1*   < > 6.7* 8.0*  CREATININE 7.16*   < > 6.07* 6.89*  K 5.6*   < > 5.5* 5.7*   < > = values in this interval not displayed.   No results for input(s): IRON, TIBC, FERRITIN in the last 168 hours. Inpatient medications:  Chlorhexidine  Gluconate Cloth  6  each Topical Daily   doxercalciferol   2 mcg Intravenous Q M,W,F-HD   insulin  aspart  0-6 Units Subcutaneous Q4H   multivitamin  1 tablet Per Tube QHS   mupirocin  ointment  1 Application Nasal BID   mouth rinse  15 mL Mouth Rinse Q2H   pantoprazole  (PROTONIX ) IV  40 mg Intravenous QHS   polyethylene glycol  17 g Per Tube Daily   senna-docusate  1 tablet Per Tube BID    albumin  human     albumin  human     anticoagulant sodium citrate      anticoagulant sodium citrate      dexmedetomidine  (PRECEDEX ) IV infusion 0.2 mcg/kg/hr (08/28/24 1200)   feeding supplement (VITAL HIGH PROTEIN) 20 mL/hr at 08/28/24 1200   fentaNYL  infusion INTRAVENOUS Stopped (08/25/24 2147)   heparin  2,400 Units/hr (08/28/24 1200)   norepinephrine  (LEVOPHED ) Adult infusion Stopped (08/27/24 0521)   albumin  human, albumin  human, anticoagulant sodium citrate , anticoagulant sodium citrate , feeding supplement (NEPRO CARB STEADY),  fentaNYL , heparin , heparin , lidocaine  (PF), lidocaine  (PF), lidocaine -prilocaine , lidocaine -prilocaine , mouth rinse, pentafluoroprop-tetrafluoroeth, pentafluoroprop-tetrafluoroeth, polyethylene glycol

## 2024-08-28 NOTE — Progress Notes (Signed)
 "  NAME:  [name], MRN:  [id number], DOB:  02/26/1956, LOS: 3 ADMISSION DATE:  08/25/2024, CHIEF COMPLAINT: Altered mental status  History of Present Illness:  [name] is a 68 year old male who resides at [facility], with past medical history significant for CAD s/p stents on Plavix , NSTEMI s/p in-stent restenosis to RCA; severe stenosis in Lcx s/p DES 11/24/23, ESRD on HD TTS, PAD s/p bilateral BKS, COPD, DM2, HTN, Afib on Eliquis  who presented to the ED after being found unresponsive during dialysis earlier today. Patient was reportedly one hour into dialysis when he was found unresponsive. In the ED, he remained unresponsive with labored breathing and pinpoint pupils. A fentanyl  patch was found applied to his chest and he was given 2mg  Narcan  for which he then aroused. Of note, varying reports of 1 vs 2 fentanyl  patch(es) found on patient, however cannot confirm at this time. Patient reportedly was then in Afib RVR, started on a Diltiazem  gtt, he remained encephalopathic despite additional Narcan  and was subsequently intubated. Nephrology was consulted with plans to resume dialysis 12/17. PCCM consulted for ICU admission.   On exam in the ED, patient hypotensive with manual BP 70s/50s, severe volume overload, will start Levo. Labs notable for elevated troponin/pro-BNP and severe metabolic derangements c/w ESRD. Work-up for metabolic, infectious, cardiac etiology ongoing.   Wife at bedside, initially had made patient DNR-L to the ED provider over the phone, however now would like to make patient Full Code and states he has expressed wanting full resuscitation should he cardiac arrest. Wife is understanding of his significant co-morbidities and overall poor health and would likely consider stopping prolonged resuscitative efforts if outcome appears futile.     Pertinent  Medical History   Past Medical History:  Diagnosis Date   Carotid artery occlusion 11/10/2010   LEFT CAROTID  ENDARTERECTOMY   Complication of anesthesia    BP WENT UP AT [facility]    COPD (chronic obstructive pulmonary disease) (HCC)    pt denies this dx as of 06/01/20 - no inhaler    Diabetes mellitus without complication (HCC)    Diverticulitis    Diverticulosis of colon (without mention of hemorrhage)    DJD (degenerative joint disease)    knees/hands/feet/back/neck   ESRD (end stage renal disease) on dialysis (HCC)    Fatty liver    Full dentures    GERD (gastroesophageal reflux disease)    H/O hiatal hernia    History of blood transfusion    with a past surical procedure per patient 06/01/20   History of right below knee amputation (HCC) 07/12/2016   Hyperlipidemia    Hypertension    Neuromuscular disorder (HCC)    peripheral neuropathy   Non-pressure chronic ulcer of other part of left foot limited to breakdown of skin (HCC) 11/12/2016   Nonrheumatic aortic (valve) stenosis 03/18/2024   Limited TTE 03/18/2024: EF 55-60, moderate LVH, normal RVSF, moderate LAE, mild MR, mild-moderate AS (mean 12, V-max 235 cm/s, DI 0.35), aortic root 39 mm, moderate pleural effusion, trivial pericardial effusion    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 Atherectomy left popliteal artery July 2012.    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 08/28/2024 - Will extubate today.  Interim History / Subjective:  Remained on pressure  support ventilation.  He was getting dialysis when I see him today.  He is much more alert and following commands per RN.SABRA  He is sleeping at the present time.  Objective    Blood pressure (!) 115/54, pulse 77, temperature 98.6 F (37 C), temperature source Axillary, resp. rate 15, height 6' 2 (1.88 m), weight 123.2 kg, SpO2 100%.    Vent Mode: PSV;CPAP FiO2 (%):  [40 %] 40 % PEEP:  [5 cmH20] 5 cmH20 Pressure Support:  [8 cmH20] 8 cmH20 Plateau Pressure:  [16 cmH20-19 cmH20] 16 cmH20   Intake/Output Summary (Last 24 hours) at 08/28/2024 1049 Last data filed at 08/28/2024 1000 Gross per 24 hour  Intake 1320.13 ml  Output 65 ml  Net 1255.13 ml   Filed Weights   08/27/24 0324 08/28/24 0453 08/28/24 0850  Weight: 124 kg 123 kg 123.2 kg    Examination:   Physical exam: General: Crtitically ill-appearing male, orally intubated HEENT: [place]/AT, eyes anicteric.  ETT and cortrak in place Neuro: Sedated, not following commands.  Eyes are closed.  Pupils 3 mm bilateral reactive to light Chest: Coarse breath sounds, no wheezes or rhonchi Heart: Regular rate and rhythm, no murmurs or gallops Abdomen: Soft, nondistended, bowel sounds present Extremities: Bilateral BKA  Resolved problem list   Assessment and Plan   Acute toxic-metabolic encephalopathy likely secondary to opiate intoxication with underlying ESRD ESRD on HD TTS-wife reports hx of non-compliance Restless Leg Syndrome -Patient found to be unresponsive w/ 1 vs 2 transdermal 50mg  Fentanyl  patch(es) applied; aroused after receiving 2mg  Narcan . Unable to confirm at this time exact number of fentanyl  patches. -Nephrology following; HD was done on 08/26/2024 next HD will be tomorrow. -Trend CMP -Replace electrolytes as indicated -Hold home sevelemer while NPO -Hold requip ; restart once extubated -CTH no acute intracranial abnormality -MRI brain shows chronic encephalomalacia and no acute issues. -Appreciate neurology input.   Recommend continuing anticoagulation for now.   PAD in setting of mechanical intubation Chronic pain; prescribed 50mg  Fentanyl  patch q72 hours; per wife on med for 15+ years; also on Percocet  7.5-325 mg q8 PRN - On Precedex .  Patient started on paralysis. -Optimize MMPR -Hold home Gabapentin  w/ acute encephalopathy; reassess with neurological improvement - Continue pressure support ventilation as long as he tolerates. - Will extubate after dialysis today.   Acute hypoxic respiratory failure likely secondary to opioid-induced respiratory depression and volume overload in setting of ESRD C/f pneumonia; recently treated for CAP 08/11/24 COPD OSA -Intubated in ED -LPV; Wean ventilator to maintain SpO2 >90% - Patient blood cultures remain negative.  No obvious pneumonia on chest x-ray.  Will stop empiric antibiotics. -VAP bundle -Bronchopulmonary hygiene; duonebs PRN -ABG/ CXR PRN -SBT daily - Will extubate after dialysis today.   Chronic Afib with acute onset RVR; on Eliquis ; follows cards outpt- prev on Amio stopped d/t tachy-brady syndrome req TPM-poor PPM candidate; CAD s/p stents (2016); with elevated troponin; on Plavix  NSTEMI 11/24/23 s/p in-stent restenosis to RCA; severe stenosis in Lcx s/p DES HFpEF-TTE 02/17/24 EF 50-55% Pericardial effusion s/p pericardiocentesis 02/2024 Hypertension PAD  s/p bilateral BKA Post-intubation/medication-related hypotension HLD Elevated troponin likely type II myocardial infarction with demand ischemia -Initially started on diltiazem  gtt for afib RVR; became hypotensive; gtt stopped; rate has since been controlled -Wean vasopressors to maintain MAP >65 & SBP >90 -Heparin  GTT while intubated will start home dose Eliquis  when extubated -Troponin 183; pBNP >35k -Transthoracic echo -EF is 50 to 55%.  No acute issues. -Restart Lipitor  -Hold home antihypertensives -On heparin  gtt. for now.  Will restart Eliquis  once he is extubated and able to swallow.    T2DM -A1c 6.4 (08/12/24) -CBG q4 +SSI -Hold home insulin    GI ppx: Famotidine . will add scheduled laxatives as he did not have a BM.   FEN -No mIVF in setting of volume overload -NPO; OGT to liws; start TF if unable to extubate in morning   On Heparin  gtt  Goals of Care Discussion: I called the patient's wife and had a discussion with her.  She is agreeable to extubate.  She understands chances of intubation.  For now she wants us  to reintubate if there is an emergency.  She is not keen on talking to palliative.  However if there is any further deterioration she will   Labs   CBC: Recent Labs  Lab 08/25/24 1415 08/25/24 1421 08/25/24 1538 08/26/24 1301 08/27/24 0927 08/28/24 0550  WBC 6.3  --   --  10.5 13.4* 10.7*  NEUTROABS 3.8  --   --   --   --   --   HGB 11.5* 12.6* 12.9* 11.9* 12.1* 12.2*  HCT 38.0* 37.0* 38.0* 37.4* 38.2* 38.9*  MCV 89.8  --   --  87.2 87.6 87.6  PLT 116*  --   --  152 144* 138*    Basic Metabolic Panel: Recent Labs  Lab 08/25/24 1415 08/25/24 1421 08/25/24 1538 08/25/24 2032 08/26/24 1301 08/27/24 0927 08/28/24 0550  NA 137   < > 136 134* 134* 131* 131*  K 5.4*   < > 5.2* 5.6* 5.6* 5.5* 5.7*  CL 98  --  100 95* 94* 91* 91*  CO2 23  --   --  21* 20* 20* 18*  GLUCOSE 125*  --  121* 138* 132* 150* 190*  BUN 53*  --  60* 58* 66* 44* 57*  CREATININE 6.63*  --  7.30* 7.16* 7.96* 6.07* 6.89*  CALCIUM  7.9*  --   --  8.2* 8.0* 8.6* 8.9  MG  --   --   --  1.9 1.8 1.9 1.9  PHOS  --   --   --  7.1* 7.8* 6.7* 8.0*   < > = values in this interval not displayed.   GFR: Estimated Creatinine Clearance: 13.9 mL/min (A) (by C-G formula based on SCr of 6.89 mg/dL (H)). Recent Labs  Lab 08/25/24 1415 08/25/24 2040 08/26/24 1301 08/27/24 0927 08/28/24 0550  WBC 6.3  --  10.5 13.4* 10.7*  LATICACIDVEN  --  1.5  --   --   --     Liver Function Tests: Recent Labs  Lab 08/25/24 2032  AST 38  ALT 19  ALKPHOS 135*  BILITOT 0.5  PROT 6.4*   ALBUMIN  3.7   No results for input(s): LIPASE, AMYLASE in the last 168 hours. No results for input(s): AMMONIA in the last 168 hours.  ABG    Component Value Date/Time   PHART 7.364 02/13/2024 1550   PCO2ART 36.7 02/13/2024 1550   PO2ART 112 (H) 02/13/2024 1550   HCO3 25.7 08/25/2024  1421   TCO2 23 08/25/2024 1538   ACIDBASEDEF 4.0 (H) 02/13/2024 1550   O2SAT 78 08/25/2024 1421     Coagulation Profile: Recent Labs  Lab 08/25/24 2032  INR 1.3*    Cardiac Enzymes: No results for input(s): CKTOTAL, CKMB, CKMBINDEX, TROPONINI in the last 168 hours.  HbA1C: Hgb A1c MFr Bld  Date/Time Value Ref Range Status  08/28/2024 05:50 AM 6.9 (H) 4.8 - 5.6 % Final    Comment:    (NOTE) Diagnosis of Diabetes The following HbA1c ranges recommended by the American Diabetes Association (ADA) may be used as an aid in the diagnosis of diabetes mellitus.  Hemoglobin             Suggested A1C NGSP%              Diagnosis  <5.7                   Non Diabetic  5.7-6.4                Pre-Diabetic  >6.4                   Diabetic  <7.0                   Glycemic control for                       adults with diabetes.    08/12/2024 02:14 AM 6.4 (H) 4.8 - 5.6 % Final    Comment:    (NOTE)         Prediabetes: 5.7 - 6.4         Diabetes: >6.4         Glycemic control for adults with diabetes: <7.0     CBG: Recent Labs  Lab 08/27/24 1528 08/27/24 1920 08/27/24 2331 08/28/24 0315 08/28/24 0756  GLUCAP 166* 130* 155* 149* 160*    Review of Systems:   12 point review system could not be done as the patient is sedated.  Past Medical History:  He,  has a past medical history of Carotid artery occlusion (11/10/2010), Complication of anesthesia, COPD (chronic obstructive pulmonary disease) (HCC), Diabetes mellitus without complication (HCC), Diverticulitis, Diverticulosis of colon (without mention of hemorrhage), DJD (degenerative joint disease), ESRD (end stage  renal disease) on dialysis (HCC), Fatty liver, Full dentures, GERD (gastroesophageal reflux disease), H/O hiatal hernia, History of blood transfusion, History of right below knee amputation (HCC) (07/12/2016), Hyperlipidemia, Hypertension, Neuromuscular disorder (HCC), Non-pressure chronic ulcer of other part of left foot limited to breakdown of skin (HCC) (11/12/2016), Nonrheumatic aortic (valve) stenosis (03/18/2024), Osteomyelitis (HCC), PAD (peripheral artery disease), Pseudoclaudication (11/15/2018), S/P BKA (below knee amputation), right (HCC) (09/18/2018), Sleep apnea, Slurred speech, Status post reversal of ileostomy (05/21/2018), 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   Surgical History:   Past Surgical History:  Procedure Laterality Date   ABDOMINAL AORTOGRAM W/LOWER EXTREMITY N/A 06/23/2021   Procedure: ABDOMINAL AORTOGRAM W/LOWER EXTREMITY;  Surgeon: Elmira Newman PARAS, MD;  Location: [facility];  Service: Cardiovascular;  Laterality: N/A;   AMPUTATION  11/05/2011   Procedure: AMPUTATION RAY;  Surgeon: Norleen Armor, MD;  Location: [facility] OR;  Service: Orthopedics;  Laterality: Right;  Amputation of Right 4&5th Toes   AMPUTATION Left 11/26/2012   Procedure: AMPUTATION RAY;  Surgeon: Norleen Armor, MD;  Location: [facility] OR;  Service: Orthopedics;  Laterality: Left;  fourth ray amputation  AMPUTATION Right 08/27/2014   Procedure: Transmetatarsal Amputation;  Surgeon: Jerona Harden GAILS, MD;  Location: [facility] OR;  Service: Orthopedics;  Laterality: Right;   AMPUTATION Right 01/14/2015   Procedure: AMPUTATION BELOW KNEE;  Surgeon: Jerona Harden GAILS, MD;  Location: [facility] OR;  Service: Orthopedics;  Laterality: Right;   AMPUTATION Left 10/21/2015   Procedure: Left Foot 5th Ray Amputation;  Surgeon: Jerona Harden GAILS, MD;  Location: [facility] OR;  Service: Orthopedics;  Laterality: Left;   AMPUTATION Left 07/04/2022   Procedure: LEFT BELOW KNEE AMPUTATION;  Surgeon:  Harden Jerona GAILS, MD;  Location: [facility] OR;  Service: Orthopedics;  Laterality: Left;   ANTERIOR FUSION CERVICAL SPINE  02/06/2006   C4-5, C5-6, C6-7; SURGEON DR. MAX COHEN   AV FISTULA PLACEMENT Left 06/02/2020   Procedure: ARTERIOVENOUS (AV) FISTULA CREATION LEFT;  Surgeon: Sheree Penne Bruckner, MD;  Location: [facility] OR;  Service: Vascular;  Laterality: Left;   BACK SURGERY     x 3   BASCILIC VEIN TRANSPOSITION Left 07/21/2020   Procedure: LEFT UPPER ARM ATERIOVENOUS SUPERFISTULALIZATION;  Surgeon: Sheree Penne Bruckner, MD;  Location: [facility] OR;  Service: Vascular;  Laterality: Left;   BELOW KNEE LEG AMPUTATION Right    CARDIAC CATHETERIZATION  10/31/2004   2009   CAROTID ENDARTERECTOMY  11/10/2010   CAROTID ENDARTERECTOMY Left 11/10/2010   Subtotal occlusion of left internal carotid artery with left hemispheric transient ischemic attacks.   CAROTID STENT     CARPAL TUNNEL RELEASE Right 10/21/2013   Procedure: RIGHT CARPAL TUNNEL RELEASE;  Surgeon: Arley JONELLE Curia, MD;  Location: [facility];  Service: Orthopedics;  Laterality: Right;   CHOLECYSTECTOMY     COLON SURGERY     COLONOSCOPY     COLOSTOMY REVERSAL  05/21/2018   ileostomy reversal   CORONARY LITHOTRIPSY N/A 11/24/2023   Procedure: CORONARY LITHOTRIPSY;  Surgeon: Jordan, Peter M, MD;  Location: [facility] INVASIVE CV LAB;  Service: Cardiovascular;  Laterality: N/A;   CORONARY STENT INTERVENTION N/A 11/24/2023   Procedure: CORONARY STENT INTERVENTION;  Surgeon: Jordan, Peter M, MD;  Location: [facility] INVASIVE CV LAB;  Service: Cardiovascular;  Laterality: N/A;   CYSTOSCOPY WITH STENT PLACEMENT Bilateral 01/13/2018   Procedure: CYSTOSCOPY WITH BILATERAL URETERAL CATHETER PLACEMENT;  Surgeon: Cam Morene ORN, MD;  Location: [facility] ORS;  Service: Urology;  Laterality: Bilateral;   ESOPHAGEAL MANOMETRY Bilateral 07/19/2014   Procedure: ESOPHAGEAL MANOMETRY (EM);  Surgeon: Gordy CHRISTELLA Starch, MD;  Location: [facility];  Service:  Gastroenterology;  Laterality: Bilateral;   EYE SURGERY Bilateral 2020   cataract   FEMORAL ARTERY STENT     x6   FINGER SURGERY     FOOT SURGERY  04/25/2016    EXCISION BASE 5TH METATARSAL AND PARTIAL CUBOID LEFT FOOT   HERNIA REPAIR     LEFT INGUINAL AND UMBILICAL REPAIRS   HERNIA REPAIR     I & D EXTREMITY Left 04/25/2016   Procedure: EXCISION BASE 5TH METATARSAL AND PARTIAL CUBOID LEFT FOOT;  Surgeon: Jerona GAILS Harden, MD;  Location: [facility] OR;  Service: Orthopedics;  Laterality: Left;   ILEOSTOMY  01/13/2018   Procedure: ILEOSTOMY;  Surgeon: Signe Mitzie LABOR, MD;  Location: [facility];  Service: General;;   ILEOSTOMY CLOSURE N/A 05/21/2018   Procedure: ILEOSTOMY REVERSAL ERAS PATHWAY;  Surgeon: Signe Mitzie LABOR, MD;  Location: [facility] OR;  Service: General;  Laterality: N/A;   IR RADIOLOGIST EVAL & MGMT  11/19/2017   IR RADIOLOGIST EVAL & MGMT  12/03/2017   IR RADIOLOGIST EVAL &  MGMT  12/18/2017   JOINT REPLACEMENT Right 2001   Total knee   LAMINECTOMY     X 3 LUMBAR AND X 2 CERVICAL SPINE OPERATIONS   LAPAROSCOPIC CHOLECYSTECTOMY W/ CHOLANGIOGRAPHY  11/09/2004   SURGEON DR. DEWARD CANDIE NULL   LEFT HEART CATH AND CORONARY ANGIOGRAPHY N/A 09/16/2018   Procedure: LEFT HEART CATH AND CORONARY ANGIOGRAPHY;  Surgeon: Elmira Newman PARAS, MD;  [facility];  Service: Cardiovascular;  Laterality: N/A;   LEFT HEART CATH AND CORONARY ANGIOGRAPHY N/A 11/24/2023   Procedure: LEFT HEART CATH AND CORONARY ANGIOGRAPHY;  Surgeon: Jordan, Peter M, MD;  Location: [facility] INVASIVE CV LAB;  Service: Cardiovascular;  Laterality: N/A;   LEFT HEART CATHETERIZATION WITH CORONARY ANGIOGRAM N/A 10/29/2014   Procedure: LEFT HEART CATHETERIZATION WITH CORONARY ANGIOGRAM;  Surgeon: Erick JONELLE Bergamo, MD;  Location: [facility] CATH LAB;  Service: Cardiovascular;  Laterality: N/A;   LIGATION OF COMPETING BRANCHES OF ARTERIOVENOUS FISTULA Left 07/21/2020   Procedure: LIGATION OF COMPETING BRANCHES OF LEFT UPPER ARM  ARTERIOVENOUS FISTULA;  Surgeon: Sheree Penne Bruckner, MD;  Location: [facility] OR;  Service: Vascular;  Laterality: Left;   LOWER EXTREMITY ANGIOGRAM N/A 03/19/2012   Procedure: LOWER EXTREMITY ANGIOGRAM;  Surgeon: Bruckner JONETTA Cash, MD;  Location: [facility] CATH LAB;  Service: Cardiovascular;  Laterality: N/A;   LOWER EXTREMITY ANGIOGRAPHY N/A 06/20/2021   Procedure: LOWER EXTREMITY ANGIOGRAPHY;  Surgeon: Elmira Newman PARAS, MD;  [facility];  Service: Cardiovascular;  Laterality: N/A;   NECK SURGERY     PARTIAL COLECTOMY N/A 01/13/2018   Procedure: LAPAROSCOPIC ASSISTED   SIGMOID COLECTOMY ILEOSTOMY;  Surgeon: Signe Mitzie LABOR, MD;  Location: [facility];  Service: General;  Laterality: N/A;   PENILE PROSTHESIS IMPLANT  08/14/2005   INFRAPUBIC INSERTION OF INFLATABLE PENILE PROSTHESIS; SURGEON DR. JANIT   PENILE PROSTHESIS IMPLANT     PERCUTANEOUS CORONARY STENT INTERVENTION (PCI-S) Right 10/29/2014   Procedure: PERCUTANEOUS CORONARY STENT INTERVENTION (PCI-S);  Surgeon: Erick JONELLE Bergamo, MD;  Location: [facility] CATH LAB;  Service: Cardiovascular;  Laterality: Right;   PERICARDIOCENTESIS N/A 02/13/2024   Procedure: PERICARDIOCENTESIS;  Surgeon: Elmira Newman PARAS, MD;  [facility];  Service: Cardiovascular;  Laterality: N/A;   PERIPHERAL VASCULAR INTERVENTION Left 06/23/2021   Procedure: PERIPHERAL VASCULAR INTERVENTION;  Surgeon: Elmira Newman PARAS, MD;  [facility];  Service: Cardiovascular;  Laterality: Left;   REMOVAL OF PENILE PROSTHESIS N/A 06/14/2021   Procedure: Removal of THREE piece inflatable penile prosthesis;  Surgeon: Carolee Sherwood JONETTA DOUGLAS, MD;  Location: [facility] OR;  Service: Urology;  Laterality: N/A;   SHOULDER ARTHROSCOPY     SPINE SURGERY     TEMPORARY PACEMAKER N/A 02/13/2024   Procedure: TEMPORARY PACEMAKER;  Surgeon: Elmira Newman PARAS, MD;  [facility];  Service: Cardiovascular;  Laterality: N/A;   TOE AMPUTATION  Left    TONSILLECTOMY     TOTAL KNEE ARTHROPLASTY  07/2002   RIGHT KNEE ; SURGEON  DR. [name] ALSO HAD ARTHROSCOPIC RIGHT KNEE IN  10/2001   TOTAL KNEE ARTHROPLASTY     ULNAR NERVE TRANSPOSITION Right 10/21/2013   Procedure: RIGHT ELBOW  ULNAR NERVE DECOMPRESSION;  Surgeon: Arley JONELLE Curia, MD;  Location: [facility];  Service: Orthopedics;  Laterality: Right;     Social History:   reports that he has been smoking cigarettes. He has a 35 pack-year smoking history. He has never used smokeless tobacco. He reports that he does not currently use  alcohol . He reports that he does not use drugs.   Family History:  His family history includes Arthritis in an other family member; Cancer in his father and sister; Colon cancer in his brother; Diabetes in his brother and father; Heart attack in his brother and father; Heart disease in his father; Heart disease (age of onset: 44) in his brother; Hyperlipidemia in his brother, father, and sister; Hypertension in his brother, father, sister, son, and another family member; Varicose Veins in his father.   Allergies Allergies[1]   Home Medications  Prior to Admission medications  Medication Sig Start Date End Date Taking? Authorizing Provider  amLODipine  (NORVASC ) 10 MG tablet Take 5 mg by mouth daily.    [provider]  apixaban  (ELIQUIS ) 5 MG TABS tablet Take 1 tablet (5 mg total) by mouth 2 (two) times daily. 01/14/22 09/09/24  Gonfa, Taye T, MD  atorvastatin  (LIPITOR ) 80 MG tablet Take 1 tablet (80 mg total) by mouth at bedtime. 11/25/23   Amoako, Prince, MD  B Complex-C-Zn-Folic Acid  (DIALYVITE /ZINC ) TABS Take 1 tablet by mouth See admin instructions. Give 1 tablet by mouth 3 days a week prior to dialysis on Tuesday, Thursday, Saturday. 10/04/21   [provider]  clopidogrel  (PLAVIX ) 75 MG tablet Take 1 tablet (75 mg total) by mouth daily with breakfast. Patient taking differently: Take 75 mg by mouth daily. 11/25/23 11/24/24   Amoako, Prince, MD  colchicine  0.6 MG tablet Take 0.5 tablets (0.3 mg total) by mouth daily. 02/21/24   Gonfa, Taye T, MD  famotidine  (PEPCID ) 20 MG tablet Take 20 mg by mouth See admin instructions. Give 1 tablet (20mg ) by mouth every other morning.    [provider]  fentaNYL  (DURAGESIC ) 50 MCG/HR Place 1 patch onto the skin every 3 (three) days. 08/12/24   Raenelle Coria, MD  folic acid  (FOLVITE ) 1 MG tablet Take 1 mg by mouth daily. 09/18/21   [provider]  gabapentin  (NEURONTIN ) 100 MG capsule Take 100 mg by mouth 3 (three) times daily.    [provider]  insulin  aspart (NOVOLOG  FLEXPEN) 100 UNIT/ML FlexPen Inject 4-16 Units into the skin See admin instructions. Inject 4 units subcutaneously three times daily plus 0-12 units per sliding scale: 70 - 150 : 0 units 151-200 : 2 units 201-250 : 4 units 251-300 : 6 units 301-350 : 8 units 351-400 : 10 units 401-450 : 12 units > 400 : give 12 units and recheck in 1 hour - if still > 400, call MD/NP for further orders.    [provider]  ipratropium-albuterol  (DUONEB) 0.5-2.5 (3) MG/3ML SOLN  05/26/24   [provider]  isosorbide  mononitrate (IMDUR ) 60 MG 24 hr tablet Take 60 mg by mouth daily. Give 60mg  by mouth one time a day    [provider]  lidocaine -prilocaine  (EMLA ) cream Apply 1 application  topically See admin instructions. Apply topically 3 days a week prior to dialysis on Tuesday, Thursday, Saturday. 06/06/21   [provider]  loratadine  (CLARITIN ) 10 MG tablet Take 10 mg by mouth daily.    [provider]  mineral oil-hydrophilic petrolatum (AQUAPHOR) ointment Apply 1 Application topically every 8 (eight) hours as needed for irritation or dry skin.    [provider]  nitroGLYCERIN  (NITROSTAT ) 0.4 MG SL tablet Place 0.4 mg under the tongue every 5 (five) minutes as needed for chest pain. 01/06/22   [provider]  oxyCODONE -acetaminophen   (PERCOCET ) 7.5-325 MG tablet Take 1 tablet by mouth  every 8 (eight) hours as needed for severe pain (pain score 7-10). 08/12/24   Raenelle Coria, MD  promethazine  (PHENERGAN ) 12.5 MG suppository Place 12.5 mg rectally daily as needed for nausea or vomiting.    [provider]  rOPINIRole  (REQUIP ) 2 MG tablet Take 2 mg by mouth at bedtime.    [provider]  sevelamer  carbonate (RENVELA ) 800 MG tablet Take 2 tablets (1,600 mg total) by mouth 3 (three) times daily with meals. 02/04/24   Caleen Qualia, MD  tamsulosin  (FLOMAX ) 0.4 MG CAPS capsule Take 0.4 mg by mouth daily.    [provider]     Critical care time: 35 Minutes.     Tamela Stakes, MD  Attending Physician, [facility] See Amion for pager If no response to pager, please call [phone] until 7pm After 7pm, Please call E-link [phone]            [1]  Allergies Allergen Reactions   Contrast Media [Iodinated Contrast Media] Shortness Of Breath and Other (See Comments)    Altered mental status     Ivp Dye [Iodinated Contrast Media] Anaphylaxis, Shortness Of Breath and Other (See Comments)    Altered mental status     Latex Rash    severe rash appears after the first 24 hours of being placed   Tape Rash and Other (See Comments)    Rash after 1 day of use  Do not leave on longer then 3 days without changing   "

## 2024-08-28 NOTE — Procedures (Signed)
 Extubation Procedure Note  Patient Details:   Name: YOEL KAUFHOLD DOB: 05-Dec-1955 MRN: [id number]   Airway Documentation:    Vent end date: 08/28/24 Vent end time: 1417   Evaluation  O2 sats: stable throughout Complications: No apparent complications Patient did tolerate procedure well. Bilateral Breath Sounds: Diminished   Yes  Pt extubated to 3L [place],per MD order. Pt tolerated well. Cuff leak present, no stridor noted, RN at bedside.   Waddell CHRISTELLA Hint 08/28/2024, 2:45 PM

## 2024-08-28 NOTE — Progress Notes (Signed)
 PHARMACY - ANTICOAGULATION CONSULT NOTE  Pharmacy Consult for Heparin  (Eliquis  on hold) Indication: atrial fibrillation  Allergies[1]  Patient Measurements: Height: 6' 2 (188 cm) Weight: 120.2 kg (264 lb 15.9 oz) IBW/kg (Calculated) : 82.2 HEPARIN  DW (KG): 109.1  Vital Signs: Temp: 98.4 F (36.9 C) (12/19 1245) Temp Source: Axillary (12/19 1245) BP: 118/62 (12/19 1500) Pulse Rate: 89 (12/19 1500)  Labs: Recent Labs    08/25/24 2032 08/26/24 1017 08/26/24 1301 08/26/24 2128 08/27/24 0927 08/27/24 1855 08/28/24 0550 08/28/24 1521  HGB  --    < > 11.9*  --  12.1*  --  12.2*  --   HCT  --   --  37.4*  --  38.2*  --  38.9*  --   PLT  --   --  152  --  144*  --  138*  --   APTT 31  --  35  --  26  --   --  73*  LABPROT 16.9*  --   --   --   --   --   --   --   INR 1.3*  --   --   --   --   --   --   --   HEPARINUNFRC <0.10*   < >  --    < > <0.10* <0.10* <0.10* 0.25*  CREATININE 7.16*  --  7.96*  --  6.07*  --  6.89*  --    < > = values in this interval not displayed.    Estimated Creatinine Clearance: 13.7 mL/min (A) (by C-G formula based on SCr of 6.89 mg/dL (H)).  Assessment: 68 yo male on eliquis  for hx afib. Pharmacy consulted to dose IV heparin  while intubated/NPO. Last dose of eliquis  not documented, but none administered inpatient, so likely >12hrs ago PTA.  Heparin  level finally detectable at 0.25 (though subtherapeutic) while on heparin  2400 units/hr. aPTT also detectable at 73 sec. HgB 12.2 and PLTs 138. Confirmed again with RN, heparin  infusion running without issues, no known interruptions.  No bleeding reported.  Goal of Therapy:  Heparin  level 0.3-0.7 units/ml aPTT 66-102 seconds Monitor platelets by anticoagulation protocol: Yes   Plan:  Increase heparin  infusion to 2500 units/hr Check heparin  level in 8 hours and daily while on heparin  Continue to monitor H&H and platelets F/u transition back to Eliquis  when tolerating PO  Thank you for allowing  pharmacy to be a part of this patients care.  Rocky Slade, PharmD, BCPS Clinical Pharmacist 08/28/2024 4:27 PM      [1]  Allergies Allergen Reactions   Contrast Media [Iodinated Contrast Media] Shortness Of Breath and Other (See Comments)    Altered mental status     Ivp Dye [Iodinated Contrast Media] Anaphylaxis, Shortness Of Breath and Other (See Comments)    Altered mental status     Latex Rash    severe rash appears after the first 24 hours of being placed   Tape Rash and Other (See Comments)    Rash after 1 day of use  Do not leave on longer then 3 days without changing

## 2024-08-28 NOTE — Progress Notes (Signed)
 At patient bedside, Patient intubated on vent,Unable to assess. Consent for treatment verified.  Access used: Left avf that worked well.  Duration of treatment: 3.5 hours.  Uf goal. Met 3 liters,he tolerated hd.  Hand off to the patient's nurse with stable vitals.

## 2024-08-28 NOTE — Progress Notes (Signed)
 PHARMACY - ANTICOAGULATION CONSULT NOTE  Pharmacy Consult for heparin  Indication: atrial fibrillation  Labs: Recent Labs    08/25/24 2032 08/26/24 1017 08/26/24 1301 08/26/24 2128 08/27/24 0927 08/27/24 1855 08/28/24 0550  HGB  --   --  11.9*  --  12.1*  --  12.2*  HCT  --   --  37.4*  --  38.2*  --  38.9*  PLT  --   --  152  --  144*  --  138*  APTT 31  --  35  --  26  --   --   LABPROT 16.9*  --   --   --   --   --   --   INR 1.3*  --   --   --   --   --   --   HEPARINUNFRC <0.10*   < >  --    < > <0.10* <0.10* <0.10*  CREATININE 7.16*  --  7.96*  --  6.07*  --   --    < > = values in this interval not displayed.   Assessment: 68yo male remains subtherapeutic on heparin  after rate change.  Goal of Therapy:  Heparin  level 0.3-0.7 units/ml   Plan:  2000 units heparin  bolus. Increase heparin  infusion by 4 units/kgABW/hr to 2400 units/hr. Check level in 8 hours.   Marvetta Dauphin, PharmD, BCPS 08/28/2024 7:01 AM

## 2024-08-29 DIAGNOSIS — I3139 Other pericardial effusion (noninflammatory): Secondary | ICD-10-CM

## 2024-08-29 DIAGNOSIS — G4733 Obstructive sleep apnea (adult) (pediatric): Secondary | ICD-10-CM

## 2024-08-29 LAB — GLUCOSE, CAPILLARY
Glucose-Capillary: 125 mg/dL — ABNORMAL HIGH (ref 70–99)
Glucose-Capillary: 134 mg/dL — ABNORMAL HIGH (ref 70–99)
Glucose-Capillary: 138 mg/dL — ABNORMAL HIGH (ref 70–99)
Glucose-Capillary: 121 mg/dL — ABNORMAL HIGH (ref 70–99)
Glucose-Capillary: 161 mg/dL — ABNORMAL HIGH (ref 70–99)

## 2024-08-29 LAB — BASIC METABOLIC PANEL WITH GFR
Anion gap: 25 — ABNORMAL HIGH (ref 5–15)
BUN: 47 mg/dL — ABNORMAL HIGH (ref 8–23)
CO2: 18 mmol/L — ABNORMAL LOW (ref 22–32)
Calcium: 9.1 mg/dL (ref 8.9–10.3)
Chloride: 91 mmol/L — ABNORMAL LOW (ref 98–111)
Creatinine, Ser: 5.75 mg/dL — ABNORMAL HIGH (ref 0.61–1.24)
GFR, Estimated: 10 mL/min — ABNORMAL LOW
Glucose, Bld: 148 mg/dL — ABNORMAL HIGH (ref 70–99)
Potassium: 4.7 mmol/L (ref 3.5–5.1)
Sodium: 133 mmol/L — ABNORMAL LOW (ref 135–145)

## 2024-08-29 LAB — HEPATITIS B SURFACE ANTIBODY, QUANTITATIVE: Hep B S AB Quant (Post): 2553 m[IU]/mL

## 2024-08-29 LAB — PHOSPHORUS: Phosphorus: 6.2 mg/dL — ABNORMAL HIGH (ref 2.5–4.6)

## 2024-08-29 LAB — CBC
HCT: 38 % — ABNORMAL LOW (ref 39.0–52.0)
Hemoglobin: 12.2 g/dL — ABNORMAL LOW (ref 13.0–17.0)
MCH: 28 pg (ref 26.0–34.0)
MCHC: 32.1 g/dL (ref 30.0–36.0)
MCV: 87.2 fL (ref 80.0–100.0)
Platelets: 161 K/uL (ref 150–400)
RBC: 4.36 MIL/uL (ref 4.22–5.81)
RDW: 17.3 % — ABNORMAL HIGH (ref 11.5–15.5)
WBC: 10.7 K/uL — ABNORMAL HIGH (ref 4.0–10.5)
nRBC: 0 % (ref 0.0–0.2)

## 2024-08-29 LAB — MAGNESIUM: Magnesium: 1.9 mg/dL (ref 1.7–2.4)

## 2024-08-29 LAB — HEPARIN LEVEL (UNFRACTIONATED): Heparin Unfractionated: 0.26 [IU]/mL — ABNORMAL LOW (ref 0.30–0.70)

## 2024-08-29 MED ORDER — POLYETHYLENE GLYCOL 3350 17 G PO PACK: 17.0000 g | PACK | Freq: Every day | ORAL | Status: DC | PRN

## 2024-08-29 MED ORDER — APIXABAN 5 MG PO TABS
5.0000 mg | ORAL_TABLET | Freq: Two times a day (BID) | ORAL | Status: DC
  Administered 2024-08-29 – 2024-09-02 (×9): 5 mg via ORAL
  Filled 2024-08-29 (×9): qty 1

## 2024-08-29 MED ORDER — HEPARIN SODIUM (PORCINE) 1000 UNIT/ML DIALYSIS: 1000.0000 [IU] | INTRAMUSCULAR | Status: DC | PRN

## 2024-08-29 MED ORDER — NEPRO/CARBSTEADY PO LIQD: 237.0000 mL | ORAL | Status: DC | PRN

## 2024-08-29 MED ORDER — ALTEPLASE 2 MG IJ SOLR: 2.0000 mg | Freq: Once | INTRAMUSCULAR | Status: DC | PRN

## 2024-08-29 MED ORDER — POLYETHYLENE GLYCOL 3350 17 G PO PACK
17.0000 g | PACK | Freq: Every day | ORAL | Status: DC
  Administered 2024-08-29: 17 g via ORAL
  Filled 2024-08-29 (×2): qty 1

## 2024-08-29 MED ORDER — ANTICOAGULANT SODIUM CITRATE 4% (200MG/5ML) IV SOLN: 5.0000 mL | Status: DC | PRN

## 2024-08-29 MED ORDER — PENTAFLUOROPROP-TETRAFLUOROETH EX AERO: 1.0000 | INHALATION_SPRAY | CUTANEOUS | Status: DC | PRN

## 2024-08-29 MED ORDER — SEVELAMER CARBONATE 800 MG PO TABS
800.0000 mg | ORAL_TABLET | Freq: Three times a day (TID) | ORAL | Status: DC
  Administered 2024-08-29 – 2024-09-02 (×8): 800 mg via ORAL
  Filled 2024-08-29 (×12): qty 1

## 2024-08-29 MED ORDER — RENA-VITE PO TABS
1.0000 | ORAL_TABLET | Freq: Every day | ORAL | Status: DC
  Administered 2024-08-29 – 2024-09-01 (×4): 1 via ORAL
  Filled 2024-08-29 (×5): qty 1

## 2024-08-29 MED ORDER — SENNOSIDES-DOCUSATE SODIUM 8.6-50 MG PO TABS
1.0000 | ORAL_TABLET | Freq: Two times a day (BID) | ORAL | Status: DC
  Administered 2024-08-29 – 2024-09-01 (×6): 1 via ORAL
  Filled 2024-08-29 (×7): qty 1

## 2024-08-29 MED ORDER — LIDOCAINE-PRILOCAINE 2.5-2.5 % EX CREA: 1.0000 | TOPICAL_CREAM | CUTANEOUS | Status: DC | PRN

## 2024-08-29 MED ORDER — CHLORHEXIDINE GLUCONATE CLOTH 2 % EX PADS
6.0000 | MEDICATED_PAD | Freq: Every day | CUTANEOUS | Status: DC
  Administered 2024-08-30: 6 via TOPICAL

## 2024-08-29 MED ORDER — LIDOCAINE HCL (PF) 1 % IJ SOLN: 5.0000 mL | INTRAMUSCULAR | Status: DC | PRN

## 2024-08-29 MED ORDER — GABAPENTIN 100 MG PO CAPS
100.0000 mg | ORAL_CAPSULE | Freq: Three times a day (TID) | ORAL | Status: DC
  Administered 2024-08-29 – 2024-09-02 (×12): 100 mg via ORAL
  Filled 2024-08-29 (×12): qty 1

## 2024-08-29 NOTE — Progress Notes (Signed)
 [facility] Progress Note  Subjective:  Seen in ICU  3 L off w/ HD yesterday Extubated yesterday afternoon, on RA  Presentation summary: 68 y.o. year-old w/ PMH as below who presented to ED yesterday afternoon sent from dialysis.  Patient was dialyzed for 1 hour and then went staff went to check on him he was minimally responsive.  His prior dialysis was a 4-week ago and the wife said he had 20 kg on.  EMS came and gave DuoNeb, he was 91% on room room air and 95 after nebs.  Was hospitalized 2 weeks ago for pneumonia.  In the ED patient remained unresponsive with labored breathing and pinpoint pupils.  A fentanyl  patch was found applied to his chest and he received Narcan  and had a good response.  Then he went into A-fib with RVR and remained encephalopathic despite additional Narcan  so was subsequently intubated for airway support.  IV Levophed  was started for hypotension with blood pressures in the 70s and significant volume overload was noted.  Per the H&P the wife initially made patient a DNR-L to the ED provider over the phone, however then she changed her mind to make the patient a full code.  Per the ICU note the wife is understanding of his serious comorbid disease and overall poor health and would likely consider stopping prolonged resuscitative efforts if outcome appears futile.   Critical care team admitted to ICU.  We are asked to see for ESRD.   Vitals:   08/29/24 0800 08/29/24 0900 08/29/24 1000 08/29/24 1128  BP: 122/84 129/81 126/69   Pulse: (!) 124 (!) 112 (!) 121   Resp: 16 (!) 21 (!) 35   Temp: 99.3 F (37.4 C)   99.9 F (37.7 C)  TempSrc: Oral   Oral  SpO2: 94% (!) 89% 94%   Weight:      Height:        Exam: Gen on vent, sedated Sclera anicteric, throat w/ ETT +JVD Chest clear anterior/ lateral RRR no MRG Abd soft ntnd no mass or ascites +bs GU deferred Ext 2+ bilat BKA and hip edema Neuro is on vent, sedated    LUA aVF+bruit     Home bp  meds: Norvasc     OP HD: [place] [facility] From early dec 2025 -> 4h  B450   108kg AVF   Heparin  none      Assessment/ Plan ESRD: on HD TTS. Had HD here Wednesday and Friday. Possible HD today if staffing allows.  Shock: getting IV cefepime / vanc abx. Shock resolved, transitioning out of ICU care today.  Volume: diffuse LE edema on exam, initial CXR w/ vasc congestion. Max UF w/ dialysis as tolerated, IV alb prn.  Up 8-12 kg.  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 Was given narcan  in ED. Extubated yesterday, confused but alert.  Afib/ RVR: on IV diltiazem      Myer Fret MD  [facility] 08/29/2024, 11:36 AM  Recent Labs  Lab 08/25/24 2032 08/26/24 1301 08/28/24 0550 08/29/24 0857  HGB  --    < > 12.2* 12.2*  ALBUMIN  3.7  --   --   --   CALCIUM  8.2*   < > 8.9 9.1  PHOS 7.1*   < > 8.0* 6.2*  CREATININE 7.16*   < > 6.89* 5.75*  K 5.6*   < > 5.7* 4.7   < > = values in this interval  not displayed.   No results for input(s): IRON, TIBC, FERRITIN in the last 168 hours. Inpatient medications:  apixaban   5 mg Oral BID   Chlorhexidine  Gluconate Cloth  6 each Topical Daily   doxercalciferol   2 mcg Intravenous Q M,W,F-HD   gabapentin   100 mg Oral TID   insulin  aspart  0-6 Units Subcutaneous Q4H   multivitamin  1 tablet Oral QHS   mupirocin  ointment  1 Application Nasal BID   mouth rinse  15 mL Mouth Rinse 4 times per day   pantoprazole  (PROTONIX ) IV  40 mg Intravenous QHS   polyethylene glycol  17 g Oral Daily   senna-docusate  1 tablet Oral BID   sevelamer  carbonate  800 mg Oral TID WC    albumin  human     albumin  human     anticoagulant sodium citrate      anticoagulant sodium citrate      norepinephrine  (LEVOPHED ) Adult infusion Stopped (08/27/24 0521)   albumin  human, albumin  human, anticoagulant sodium citrate , anticoagulant sodium citrate , feeding supplement (NEPRO CARB STEADY),  fentaNYL , heparin , heparin , lidocaine  (PF), lidocaine  (PF), lidocaine -prilocaine , lidocaine -prilocaine , mouth rinse, pentafluoroprop-tetrafluoroeth, pentafluoroprop-tetrafluoroeth, polyethylene glycol

## 2024-08-29 NOTE — Progress Notes (Signed)
 PHARMACY - ANTICOAGULATION CONSULT NOTE  Pharmacy Consult for Eliquis  Indication: atrial fibrillation  Allergies[1]  Patient Measurements: Height: 6' 2 (188 cm) Weight: 122.5 kg (270 lb 1 oz) IBW/kg (Calculated) : 82.2 HEPARIN  DW (KG): 109.1  Vital Signs: Temp: 99.3 F (37.4 C) (12/20 0800) Temp Source: Oral (12/20 0800) BP: 122/84 (12/20 0800) Pulse Rate: 124 (12/20 0800)  Labs: Recent Labs    08/26/24 1301 08/26/24 2128 08/27/24 0927 08/27/24 1855 08/28/24 0550 08/28/24 1521 08/29/24 0201 08/29/24 0857  HGB 11.9*  --  12.1*  --  12.2*  --   --  12.2*  HCT 37.4*  --  38.2*  --  38.9*  --   --  38.0*  PLT 152  --  144*  --  138*  --   --  161  APTT 35  --  26  --   --  73*  --   --   HEPARINUNFRC  --    < > <0.10*   < > <0.10* 0.25* 0.26*  --   CREATININE 7.96*  --  6.07*  --  6.89*  --   --   --    < > = values in this interval not displayed.    Estimated Creatinine Clearance: 13.9 mL/min (A) (by C-G formula based on SCr of 6.89 mg/dL (H)).  Assessment: 68 yo male on eliquis  for hx afib. Pharmacy consulted to dose IV heparin  while intubated/NPO. Last dose of eliquis  not documented, but none administered inpatient, so likely >12hrs ago PTA.  Consulted to resume Eliquis  now that he can take PO. No bleeding noted, Hgb stable, plt trending up.    Plan:  Discontinue heparin  infusion - discussed with RN Eliquis  5 mg PO bid as PTA Pharmacy signing off but will continue to follow peripherally - please re-consult if needed  Thank you for involving pharmacy in this patient's care.  Delon Sax, PharmD, BCPS Clinical Pharmacist Clinical phone for 08/29/2024 is [phone] 08/29/2024 9:49 AM        [1]  Allergies Allergen Reactions   Contrast Media [Iodinated Contrast Media] Shortness Of Breath and Other (See Comments)    Altered mental status     Ivp Dye [Iodinated Contrast Media] Anaphylaxis, Shortness Of Breath and Other (See Comments)    Altered mental  status     Latex Rash    severe rash appears after the first 24 hours of being placed   Tape Rash and Other (See Comments)    Rash after 1 day of use  Do not leave on longer then 3 days without changing

## 2024-08-29 NOTE — Progress Notes (Addendum)
 "  NAME:  [name], MRN:  [id number], DOB:  02/06/1956, LOS: 4 ADMISSION DATE:  08/25/2024, CHIEF COMPLAINT: Altered mental status  History of Present Illness:  [name] is a 68 year old male who resides at [facility], with past medical history significant for CAD s/p stents on Plavix , NSTEMI s/p in-stent restenosis to RCA; severe stenosis in Lcx s/p DES 11/24/23, ESRD on HD TTS, PAD s/p bilateral BKS, COPD, DM2, HTN, Afib on Eliquis  who presented to the ED after being found unresponsive during dialysis earlier today. Patient was reportedly one hour into dialysis when he was found unresponsive. In the ED, he remained unresponsive with labored breathing and pinpoint pupils. A fentanyl  patch was found applied to his chest and he was given 2mg  Narcan  for which he then aroused. Of note, varying reports of 1 vs 2 fentanyl  patch(es) found on patient, however cannot confirm at this time. Patient reportedly was then in Afib RVR, started on a Diltiazem  gtt, he remained encephalopathic despite additional Narcan  and was subsequently intubated. Nephrology was consulted with plans to resume dialysis 12/17. PCCM consulted for ICU admission.   On exam in the ED, patient hypotensive with manual BP 70s/50s, severe volume overload, will start Levo. Labs notable for elevated troponin/pro-BNP and severe metabolic derangements c/w ESRD. Work-up for metabolic, infectious, cardiac etiology ongoing.   Wife at bedside, initially had made patient DNR-L to the ED provider over the phone, however now would like to make patient Full Code and states he has expressed wanting full resuscitation should he cardiac arrest. Wife is understanding of his significant co-morbidities and overall poor health and would likely consider stopping prolonged resuscitative efforts if outcome appears futile.     Pertinent  Medical History   Past Medical History:  Diagnosis Date   Carotid artery occlusion 11/10/2010   LEFT CAROTID  ENDARTERECTOMY   Complication of anesthesia    BP WENT UP AT [facility]    COPD (chronic obstructive pulmonary disease) (HCC)    pt denies this dx as of 06/01/20 - no inhaler    Diabetes mellitus without complication (HCC)    Diverticulitis    Diverticulosis of colon (without mention of hemorrhage)    DJD (degenerative joint disease)    knees/hands/feet/back/neck   ESRD (end stage renal disease) on dialysis (HCC)    Fatty liver    Full dentures    GERD (gastroesophageal reflux disease)    H/O hiatal hernia    History of blood transfusion    with a past surical procedure per patient 06/01/20   History of right below knee amputation (HCC) 07/12/2016   Hyperlipidemia    Hypertension    Neuromuscular disorder (HCC)    peripheral neuropathy   Non-pressure chronic ulcer of other part of left foot limited to breakdown of skin (HCC) 11/12/2016   Nonrheumatic aortic (valve) stenosis 03/18/2024   Limited TTE 03/18/2024: EF 55-60, moderate LVH, normal RVSF, moderate LAE, mild MR, mild-moderate AS (mean 12, V-max 235 cm/s, DI 0.35), aortic root 39 mm, moderate pleural effusion, trivial pericardial effusion    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 Atherectomy left popliteal artery July 2012.    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 08/28/2024 -extubated  Interim History / Subjective:   Stable postextubation.  More awake  today  Objective    Blood pressure 110/71, pulse (!) 111, temperature 99.7 F (37.6 C), temperature source Axillary, resp. rate 18, height 6' 2 (1.88 m), weight 122.5 kg, SpO2 98%.    Vent Mode: PSV;CPAP FiO2 (%):  [40 %] 40 % PEEP:  [5 cmH20] 5 cmH20 Pressure Support:  [8 cmH20] 8 cmH20   Intake/Output Summary (Last 24 hours) at 08/29/2024 0718 Last data filed at 08/29/2024 0400 Gross per 24 hour  Intake 825.93 ml  Output 3000 ml  Net -2174.07 ml   Filed Weights   08/28/24 0850 08/28/24 1257 08/29/24 0500  Weight: 123.2 kg 120.2 kg 122.5 kg    Examination: Ill-appearing man Lungs are clear to auscultation Abdomen soft, positive bowel sounds Heart rate regular rate and rhythm. Bilateral BKA  Lab/imaging reviewed Significant for sodium 131, BUN/creatinine 57/7.69, potassium 5.7 WBC 10.7, hemoglobin 12.2, platelets 138   Resolved problem list   Assessment and Plan   Acute toxic-metabolic encephalopathy likely secondary to opiate intoxication with underlying ESRD ESRD on HD TTS-wife reports hx of non-compliance Restless Leg Syndrome -Patient found to be unresponsive w/ 1 vs 2 transdermal 50mg  Fentanyl  patch(es) applied; aroused after receiving 2mg  Narcan . Unable to confirm at this time exact number of fentanyl  patches. MRI with no acute abnormality Continue dialysis per nephrology   Chronic pain; prescribed 50mg  Fentanyl  patch q72 hours; per wife on med for 15+ years; also on Percocet  7.5-325 mg q8 PRN Restart home gabapentin     Acute hypoxic respiratory failure likely secondary to opioid-induced respiratory depression and volume overload in setting of ESRD C/f pneumonia; recently treated for CAP 08/11/24 COPD OSA Wean down oxygen  as tolerated   Chronic Afib with acute onset RVR; on Eliquis ; follows cards outpt- prev on [facility] stopped d/t tachy-brady syndrome req TPM-poor PPM candidate; CAD s/p stents (2016); with elevated troponin; on Plavix  NSTEMI 11/24/23 s/p  in-stent restenosis to RCA; severe stenosis in Lcx s/p DES [id number] 02/17/24 EF 50-55% Pericardial effusion s/p pericardiocentesis 02/2024 Hypertension PAD s/p bilateral BKA Post-intubation/medication-related hypotension HLD Elevated troponin likely type II myocardial infarction with demand ischemia Rate controlled, change heparin  drip to outpatient Eliquis  when able to take p.o. -Transthoracic echo -EF is 50 to 55%.  No acute issues. -Restart Lipitor  when able to take p.o. -Hold home antihypertensives Swallow eval   T2DM -A1c 6.4 (08/12/24) -CBG q4 +SSI -Hold home insulin    GI ppx: Famotidine . will add scheduled laxatives as he did not have a BM.   FEN -No mIVF in setting of volume overload -NPO.  Swallow eval.  Stable for transfer out of ICU.  Critical care time: NA   Lonna Coder MD [facility] See Amion for pager  If no response to pager , please call [phone] until 7pm After 7:00 pm call Elink  [phone] 08/29/2024, 9:17 AM          "

## 2024-08-29 NOTE — Progress Notes (Signed)
" °   08/29/24 1727  Vitals  Temp 99 F (37.2 C)  Pulse Rate (!) 135  Resp (!) 27  BP (!) 159/78  SpO2 97 %  O2 Device Room Air  Weight 119.5 kg  Type of Weight Post-Dialysis  Oxygen  Therapy  Patient Activity (if Appropriate) In bed  Pulse Oximetry Type Continuous  Oximetry Probe Site Changed No  Post Treatment  Dialyzer Clearance Lightly streaked  Hemodialysis Intake (mL) 0 mL  Liters Processed 71.9  Fluid Removed (mL) 3000 mL  Tolerated HD Treatment Yes  AVG/AVF Arterial Site Held (minutes) 10 minutes  AVG/AVF Venous Site Held (minutes) 10 minutes   Received patient in bed to unit.  Alert and oriented.  Informed consent signed and in chart.   TX duration:3.0  Patient tolerated well.  Transported back to the room  Alert, without acute distress.  Hand-off given to patient's nurse.   Access used: LUAF Access issues: no complications  Total UF removed: 3000 Medication(s) given: none   Delon LITTIE [facility] "

## 2024-08-29 NOTE — Evaluation (Signed)
 Clinical/Bedside Swallow Evaluation Patient Details  Name: [name] MRN: [id number] Date of Birth: [birth year]  Today's Date: 08/29/2024 Time: SLP Start Time (ACUTE ONLY): 9167 SLP Stop Time (ACUTE ONLY): 0850 SLP Time Calculation (min) (ACUTE ONLY): 18 min  Past Medical History:  Past Medical History:  Diagnosis Date   Carotid artery occlusion 11/10/2010   LEFT CAROTID ENDARTERECTOMY   Complication of anesthesia    BP WENT UP AT [facility]    COPD (chronic obstructive pulmonary disease) (HCC)    pt denies this dx as of 06/01/20 - no inhaler    Diabetes mellitus without complication (HCC)    Diverticulitis    Diverticulosis of colon (without mention of hemorrhage)    DJD (degenerative joint disease)    knees/hands/feet/back/neck   ESRD (end stage renal disease) on dialysis (HCC)    Fatty liver    Full dentures    GERD (gastroesophageal reflux disease)    H/O hiatal hernia    History of blood transfusion    with a past surical procedure per patient 06/01/20   History of right below knee amputation (HCC) 07/12/2016   Hyperlipidemia    Hypertension    Neuromuscular disorder (HCC)    peripheral neuropathy   Non-pressure chronic ulcer of other part of left foot limited to breakdown of skin (HCC) 11/12/2016   Nonrheumatic aortic (valve) stenosis 03/18/2024   Limited TTE 03/18/2024: EF 55-60, moderate LVH, normal RVSF, moderate LAE, mild MR, mild-moderate AS (mean 12, V-max 235 cm/s, DI 0.35), aortic root 39 mm, moderate pleural effusion, trivial pericardial effusion    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 Atherectomy left popliteal artery July 2012.    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   Unstable angina (HCC) 09/16/2018   Wears glasses    Past Surgical History:  Past Surgical History:  Procedure Laterality Date   ABDOMINAL AORTOGRAM W/LOWER EXTREMITY N/A 06/23/2021   Procedure: ABDOMINAL AORTOGRAM W/LOWER EXTREMITY;  Surgeon: Elmira Newman PARAS, MD;  [facility];  Service: Cardiovascular;  Laterality: N/A;   AMPUTATION  11/05/2011   Procedure: AMPUTATION RAY;  Surgeon: Norleen Armor, MD;  Location: [facility] OR;  Service: Orthopedics;  Laterality: Right;  Amputation of Right 4&5th Toes   AMPUTATION Left 11/26/2012   Procedure: AMPUTATION RAY;  Surgeon: Norleen Armor, MD;  Location: [facility] OR;  Service: Orthopedics;  Laterality: Left;  fourth ray amputation   AMPUTATION Right 08/27/2014   Procedure: Transmetatarsal Amputation;  Surgeon: Jerona Harden GAILS, MD;  Location: [facility] OR;  Service: Orthopedics;  Laterality: Right;   AMPUTATION Right 01/14/2015   Procedure: AMPUTATION BELOW KNEE;  Surgeon: Jerona Harden GAILS, MD;  Location: [facility] OR;  Service: Orthopedics;  Laterality: Right;   AMPUTATION Left 10/21/2015   Procedure: Left Foot 5th Ray Amputation;  Surgeon: Jerona Harden GAILS, MD;  Location: [facility] OR;  Service: Orthopedics;  Laterality: Left;   AMPUTATION Left 07/04/2022   Procedure: LEFT BELOW KNEE AMPUTATION;  Surgeon: Harden Jerona GAILS, MD;  Location: [facility] OR;  Service: Orthopedics;  Laterality: Left;   ANTERIOR FUSION CERVICAL SPINE  02/06/2006   C4-5, C5-6, C6-7; SURGEON DR. MAX COHEN  AV FISTULA PLACEMENT Left 06/02/2020   Procedure: ARTERIOVENOUS (AV) FISTULA CREATION LEFT;  Surgeon: Sheree Penne Bruckner, MD;  Location: [facility] OR;  Service: Vascular;  Laterality: Left;   BACK SURGERY     x 3   BASCILIC VEIN TRANSPOSITION Left 07/21/2020   Procedure: LEFT UPPER ARM ATERIOVENOUS SUPERFISTULALIZATION;  Surgeon: Sheree Penne Bruckner, MD;  Location: [facility] OR;  Service: Vascular;  Laterality: Left;   BELOW KNEE LEG AMPUTATION Right    CARDIAC CATHETERIZATION  10/31/2004    2009   CAROTID ENDARTERECTOMY  11/10/2010   CAROTID ENDARTERECTOMY Left 11/10/2010   Subtotal occlusion of left internal carotid artery with left hemispheric transient ischemic attacks.   CAROTID STENT     CARPAL TUNNEL RELEASE Right 10/21/2013   Procedure: RIGHT CARPAL TUNNEL RELEASE;  Surgeon: Arley JONELLE Curia, MD;  Location: [facility];  Service: Orthopedics;  Laterality: Right;   CHOLECYSTECTOMY     COLON SURGERY     COLONOSCOPY     COLOSTOMY REVERSAL  05/21/2018   ileostomy reversal   CORONARY LITHOTRIPSY N/A 11/24/2023   Procedure: CORONARY LITHOTRIPSY;  Surgeon: Jordan, Peter M, MD;  Location: [facility] INVASIVE CV LAB;  Service: Cardiovascular;  Laterality: N/A;   CORONARY STENT INTERVENTION N/A 11/24/2023   Procedure: CORONARY STENT INTERVENTION;  Surgeon: Jordan, Peter M, MD;  Location: [facility] INVASIVE CV LAB;  Service: Cardiovascular;  Laterality: N/A;   CYSTOSCOPY WITH STENT PLACEMENT Bilateral 01/13/2018   Procedure: CYSTOSCOPY WITH BILATERAL URETERAL CATHETER PLACEMENT;  Surgeon: Cam Morene ORN, MD;  Location: [facility] ORS;  Service: Urology;  Laterality: Bilateral;   ESOPHAGEAL MANOMETRY Bilateral 07/19/2014   Procedure: ESOPHAGEAL MANOMETRY (EM);  Surgeon: Gordy CHRISTELLA Starch, MD;  Location: [facility];  Service: Gastroenterology;  Laterality: Bilateral;   EYE SURGERY Bilateral 2020   cataract   FEMORAL ARTERY STENT     x6   FINGER SURGERY     FOOT SURGERY  04/25/2016    EXCISION BASE 5TH METATARSAL AND PARTIAL CUBOID LEFT FOOT   HERNIA REPAIR     LEFT INGUINAL AND UMBILICAL REPAIRS   HERNIA REPAIR     I & D EXTREMITY Left 04/25/2016   Procedure: EXCISION BASE 5TH METATARSAL AND PARTIAL CUBOID LEFT FOOT;  Surgeon: Jerona LULLA Sage, MD;  Location: [facility] OR;  Service: Orthopedics;  Laterality: Left;   ILEOSTOMY  01/13/2018   Procedure: ILEOSTOMY;  Surgeon: Signe Mitzie LABOR, MD;  Location: [facility]S;  Service: General;;   ILEOSTOMY CLOSURE N/A 05/21/2018   Procedure: ILEOSTOMY  REVERSAL ERAS PATHWAY;  Surgeon: Signe Mitzie LABOR, MD;  Location: [facility] OR;  Service: General;  Laterality: N/A;   IR RADIOLOGIST EVAL & MGMT  11/19/2017   IR RADIOLOGIST EVAL & MGMT  12/03/2017   IR RADIOLOGIST EVAL & MGMT  12/18/2017   JOINT REPLACEMENT Right 2001   Total knee   LAMINECTOMY     X 3 LUMBAR AND X 2 CERVICAL SPINE OPERATIONS   LAPAROSCOPIC CHOLECYSTECTOMY W/ CHOLANGIOGRAPHY  11/09/2004   SURGEON DR. DEWARD CANDIE NULL   LEFT HEART CATH AND CORONARY ANGIOGRAPHY N/A 09/16/2018   Procedure: LEFT HEART CATH AND CORONARY ANGIOGRAPHY;  Surgeon: Elmira Newman PARAS, MD;  [facility];  Service: Cardiovascular;  Laterality: N/A;   LEFT HEART CATH AND CORONARY ANGIOGRAPHY N/A 11/24/2023   Procedure: LEFT HEART CATH AND CORONARY ANGIOGRAPHY;  Surgeon: Jordan, Peter M, MD;  Location: [facility] INVASIVE CV LAB;  Service: Cardiovascular;  Laterality: N/A;   LEFT HEART CATHETERIZATION WITH CORONARY ANGIOGRAM  N/A 10/29/2014   Procedure: LEFT HEART CATHETERIZATION WITH CORONARY ANGIOGRAM;  Surgeon: Erick JONELLE Bergamo, MD;  Location: [facility] CATH LAB;  Service: Cardiovascular;  Laterality: N/A;   LIGATION OF COMPETING BRANCHES OF ARTERIOVENOUS FISTULA Left 07/21/2020   Procedure: LIGATION OF COMPETING BRANCHES OF LEFT UPPER ARM ARTERIOVENOUS FISTULA;  Surgeon: Sheree Penne Bruckner, MD;  Location: [facility] OR;  Service: Vascular;  Laterality: Left;   LOWER EXTREMITY ANGIOGRAM N/A 03/19/2012   Procedure: LOWER EXTREMITY ANGIOGRAM;  Surgeon: Bruckner JONETTA Cash, MD;  Location: [facility] CATH LAB;  Service: Cardiovascular;  Laterality: N/A;   LOWER EXTREMITY ANGIOGRAPHY N/A 06/20/2021   Procedure: LOWER EXTREMITY ANGIOGRAPHY;  Surgeon: Elmira Newman PARAS, MD;  [facility];  Service: Cardiovascular;  Laterality: N/A;   NECK SURGERY     PARTIAL COLECTOMY N/A 01/13/2018   Procedure: LAPAROSCOPIC ASSISTED   SIGMOID COLECTOMY ILEOSTOMY;  Surgeon: Signe Mitzie LABOR, MD;  Location: [facility]S;   Service: General;  Laterality: N/A;   PENILE PROSTHESIS IMPLANT  08/14/2005   INFRAPUBIC INSERTION OF INFLATABLE PENILE PROSTHESIS; SURGEON DR. JANIT   PENILE PROSTHESIS IMPLANT     PERCUTANEOUS CORONARY STENT INTERVENTION (PCI-S) Right 10/29/2014   Procedure: PERCUTANEOUS CORONARY STENT INTERVENTION (PCI-S);  Surgeon: Erick JONELLE Bergamo, MD;  Location: [facility] CATH LAB;  Service: Cardiovascular;  Laterality: Right;   PERICARDIOCENTESIS N/A 02/13/2024   Procedure: PERICARDIOCENTESIS;  Surgeon: Elmira Newman PARAS, MD;  [facility];  Service: Cardiovascular;  Laterality: N/A;   PERIPHERAL VASCULAR INTERVENTION Left 06/23/2021   Procedure: PERIPHERAL VASCULAR INTERVENTION;  Surgeon: Elmira Newman PARAS, MD;  [facility];  Service: Cardiovascular;  Laterality: Left;   REMOVAL OF PENILE PROSTHESIS N/A 06/14/2021   Procedure: Removal of THREE piece inflatable penile prosthesis;  Surgeon: Carolee Sherwood JONETTA DOUGLAS, MD;  Location: [facility] OR;  Service: Urology;  Laterality: N/A;   SHOULDER ARTHROSCOPY     SPINE SURGERY     TEMPORARY PACEMAKER N/A 02/13/2024   Procedure: TEMPORARY PACEMAKER;  Surgeon: Elmira Newman PARAS, MD;  [facility];  Service: Cardiovascular;  Laterality: N/A;   TOE AMPUTATION Left    TONSILLECTOMY     TOTAL KNEE ARTHROPLASTY  07/2002   RIGHT KNEE ; SURGEON  DR. [name] ALSO HAD ARTHROSCOPIC RIGHT KNEE IN  10/2001   TOTAL KNEE ARTHROPLASTY     ULNAR NERVE TRANSPOSITION Right 10/21/2013   Procedure: RIGHT ELBOW  ULNAR NERVE DECOMPRESSION;  Surgeon: Arley JONELLE Curia, MD;  Location: [facility];  Service: Orthopedics;  Laterality: Right;   HPI:  Pt is a 68 year old male who resides at [facility], who presented to the ED after being found unresponsive during dialysis. In the ED, he remained unresponsive with labored breathing and pinpoint pupils. Admitted for respiratory failure due to possible opiate overdose and intubated. ETT  12/16- 12/19. CT head/MRI both negative.  BSE (02/14/24) noted lethargy as primary swallow safety concern and he was started on a full liquid diet. Diet advanced to regular on 02/17/24. PMH:  CAD s/p stents on Plavix , NSTEMI s/p in-stent restenosis to RCA; severe stenosis in Lcx s/p DES 11/24/23, ESRD on HD TTS, PAD s/p bilateral BKS, COPD, DM2, HTN, Afib on Eliquis .    Assessment / Plan / Recommendation  Clinical Impression  Pt presents with functional oropharyngeal swallow per bedside evaluation, however there is concern for impact of alertness and mentation on consistent safety with POs. He was lethargic upon arrival, but aroused to verbal  stimuli. He responded to questions and instruction initially with one word (alright), but later began responding in sentences and even requesting dentures. Dentures not available this date, but he reports using them for all intake. Oral motor exam WFL. Vocal quality is mildly hoarse s/p extubation. With assist, he consumed thin liquids by straw without s/sx of aspiration to follow. Encouraged consecutive sipping and instructed in 3oz water  swallow challenge, however pt unable to follow these instructions. Mildly prolonged mastication present with graham cracker likely due to edentulism. Oral clearance full with all POs.       PLAN: Given lethargy and mentation, along with h/o COPD which can increase risk for silent aspiration, will start conservatively with a dys 1 diet/thin liquids with meds crushed in puree. Discussed with RN about eval and diet recommendations. Encouraged 1:1 supervision during meals and to notify SLP team if pt having difficulty with POs, especially thin liquids. Will continue f/u for diet tolerance and advancement as appropriate, along with training in swallow strategies.  SLP Visit Diagnosis: Dysphagia, unspecified (R13.10)    Aspiration Risk  Mild aspiration risk;Moderate aspiration risk    Diet Recommendation           Other  Recommendations Oral Care Recommendations: Oral care BID     Swallow Evaluation Recommendations Recommendations: PO diet PO Diet Recommendation: Dysphagia 1 (Pureed);Thin liquids (Level 0) Liquid Administration via: Cup;Straw Medication Administration: Crushed with puree Supervision: Staff to assist with self-feeding;Full supervision/cueing for swallowing strategies Swallowing strategies  : Minimize environmental distractions;Slow rate;Small bites/sips Postural changes: Position pt fully upright for meals Oral care recommendations: Oral care BID (2x/day)   Assistance Recommended at Discharge    Functional Status Assessment Patient has had a recent decline in their functional status and demonstrates the ability to make significant improvements in function in a reasonable and predictable amount of time.  Frequency and Duration min 2x/week  2 weeks       Prognosis Prognosis for improved oropharyngeal function: Good Barriers to Reach Goals: Cognitive deficits      Swallow Study   General Date of Onset: 08/25/24 HPI: Pt is a 68 year old male who resides at [facility], who presented to the ED after being found unresponsive during dialysis. In the ED, he remained unresponsive with labored breathing and pinpoint pupils. Admitted for respiratory failure due to possible opiate overdose and intubated. ETT 12/16- 12/19. CT head/MRI both negative.  BSE (02/14/24) noted lethargy as primary swallow safety concern and he was started on a full liquid diet. Diet advanced to regular on 02/17/24. PMH:  CAD s/p stents on Plavix , NSTEMI s/p in-stent restenosis to RCA; severe stenosis in Lcx s/p DES 11/24/23, ESRD on HD TTS, PAD s/p bilateral BKS, COPD, DM2, HTN, Afib on Eliquis . Type of Study: Bedside Swallow Evaluation Previous Swallow Assessment: see HPI Diet Prior to this Study: NPO Temperature Spikes Noted: Yes (100.1) Respiratory Status: Room air History of Recent Intubation: Yes Total duration of  intubation (days): 4 days Date extubated: 08/28/24 Behavior/Cognition: Alert;Cooperative;Pleasant mood;Confused;Requires cueing;Lethargic/Drowsy Oral Cavity Assessment: Within Functional Limits (thick secretions on beard/running down face upon arrival when pt asleep) Oral Care Completed by SLP: Yes Oral Cavity - Dentition: Edentulous;Dentures, not available Vision:  (difficult to assess) Self-Feeding Abilities: Needs assist Patient Positioning: Upright in bed;Postural control adequate for testing Baseline Vocal Quality: Hoarse Volitional Cough: Strong Volitional Swallow: Able to elicit    Oral/Motor/Sensory Function Overall Oral Motor/Sensory Function: Within functional limits   Ice Chips Ice chips: Not tested  Thin Liquid Thin Liquid: Within functional limits Presentation: Straw    Nectar Thick Nectar Thick Liquid: Not tested   Honey Thick Honey Thick Liquid: Not tested   Puree Puree: Within functional limits Presentation: Spoon   Solid     Solid: Impaired Presentation: Self Fed Oral Phase Impairments: Impaired mastication Oral Phase Functional Implications: Impaired mastication       Wilder Kin, MA, CCC-SLP Acute Rehabilitation Services Office Number: [phone]  Wilder KANDICE Kin 08/29/2024,9:14 AM

## 2024-08-29 NOTE — Progress Notes (Signed)
 PHARMACY - ANTICOAGULATION CONSULT NOTE  Pharmacy Consult for Heparin  (Eliquis  on hold) Indication: atrial fibrillation  Allergies[1]  Patient Measurements: Height: 6' 2 (188 cm) Weight: 120.2 kg (264 lb 15.9 oz) IBW/kg (Calculated) : 82.2 HEPARIN  DW (KG): 109.1  Vital Signs: Temp: 100.1 F (37.8 C) (12/20 0000) Temp Source: Axillary (12/20 0000) BP: 126/92 (12/20 0100) Pulse Rate: 107 (12/20 0100)  Labs: Recent Labs    08/26/24 1301 08/26/24 2128 08/27/24 0927 08/27/24 1855 08/28/24 0550 08/28/24 1521 08/29/24 0201  HGB 11.9*  --  12.1*  --  12.2*  --   --   HCT 37.4*  --  38.2*  --  38.9*  --   --   PLT 152  --  144*  --  138*  --   --   APTT 35  --  26  --   --  73*  --   HEPARINUNFRC  --    < > <0.10*   < > <0.10* 0.25* 0.26*  CREATININE 7.96*  --  6.07*  --  6.89*  --   --    < > = values in this interval not displayed.    Estimated Creatinine Clearance: 13.7 mL/min (A) (by C-G formula based on SCr of 6.89 mg/dL (H)).  Assessment: 68 yo male on eliquis  for hx afib. Pharmacy consulted to dose IV heparin  while intubated/NPO. Last dose of eliquis  not documented, but none administered inpatient, so likely >12hrs ago PTA.  Heparin  level subtherapeutic 0.26 on heparin  2500 units/hr. Last HgB 12.2 and PLTs 138. Per RN, heparin  infusion running without issues, no known interruptions. No bleeding reported.   Goal of Therapy:  Heparin  level 0.3-0.7 units/ml Monitor platelets by anticoagulation protocol: Yes   Plan:  Increase heparin  infusion to 2700 units/hr Check heparin  level in 8 hours and daily while on heparin  Continue to monitor H&H and platelets F/u transition back to Eliquis  when tolerating PO  Thank you for allowing pharmacy to be a part of this patients care.  Lynwood Poplar, PharmD, BCPS Clinical Pharmacist 08/29/2024 2:49 AM      [1]  Allergies Allergen Reactions   Contrast Media [Iodinated Contrast Media] Shortness Of Breath and Other (See  Comments)    Altered mental status     Ivp Dye [Iodinated Contrast Media] Anaphylaxis, Shortness Of Breath and Other (See Comments)    Altered mental status     Latex Rash    severe rash appears after the first 24 hours of being placed   Tape Rash and Other (See Comments)    Rash after 1 day of use  Do not leave on longer then 3 days without changing

## 2024-08-30 DIAGNOSIS — R4182 Altered mental status, unspecified: Secondary | ICD-10-CM | POA: Diagnosis not present

## 2024-08-30 LAB — CBC
HCT: 39 % (ref 39.0–52.0)
Hemoglobin: 12.2 g/dL — ABNORMAL LOW (ref 13.0–17.0)
MCH: 28.4 pg (ref 26.0–34.0)
MCHC: 31.3 g/dL (ref 30.0–36.0)
MCV: 90.9 fL (ref 80.0–100.0)
Platelets: 163 K/uL (ref 150–400)
RBC: 4.29 MIL/uL (ref 4.22–5.81)
RDW: 18 % — ABNORMAL HIGH (ref 11.5–15.5)
WBC: 8.6 K/uL (ref 4.0–10.5)
nRBC: 0 % (ref 0.0–0.2)

## 2024-08-30 LAB — BASIC METABOLIC PANEL WITH GFR
Anion gap: 20 — ABNORMAL HIGH (ref 5–15)
BUN: 39 mg/dL — ABNORMAL HIGH (ref 8–23)
CO2: 23 mmol/L (ref 22–32)
Calcium: 9.4 mg/dL (ref 8.9–10.3)
Chloride: 92 mmol/L — ABNORMAL LOW (ref 98–111)
Creatinine, Ser: 4.95 mg/dL — ABNORMAL HIGH (ref 0.61–1.24)
GFR, Estimated: 12 mL/min — ABNORMAL LOW
Glucose, Bld: 154 mg/dL — ABNORMAL HIGH (ref 70–99)
Potassium: 4.1 mmol/L (ref 3.5–5.1)
Sodium: 135 mmol/L (ref 135–145)

## 2024-08-30 LAB — GLUCOSE, CAPILLARY
Glucose-Capillary: 143 mg/dL — ABNORMAL HIGH (ref 70–99)
Glucose-Capillary: 182 mg/dL — ABNORMAL HIGH (ref 70–99)
Glucose-Capillary: 157 mg/dL — ABNORMAL HIGH (ref 70–99)
Glucose-Capillary: 137 mg/dL — ABNORMAL HIGH (ref 70–99)
Glucose-Capillary: 194 mg/dL — ABNORMAL HIGH (ref 70–99)

## 2024-08-30 LAB — CULTURE, BLOOD (ROUTINE X 2)
Culture: NO GROWTH
Special Requests: ADEQUATE

## 2024-08-30 LAB — PHOSPHORUS: Phosphorus: 5.4 mg/dL — ABNORMAL HIGH (ref 2.5–4.6)

## 2024-08-30 LAB — MAGNESIUM: Magnesium: 2 mg/dL (ref 1.7–2.4)

## 2024-08-30 MED ORDER — CHLORHEXIDINE GLUCONATE CLOTH 2 % EX PADS
6.0000 | MEDICATED_PAD | Freq: Every day | CUTANEOUS | Status: DC
  Administered 2024-08-31 – 2024-09-02 (×2): 6 via TOPICAL

## 2024-08-30 MED ORDER — ENSURE PLUS HIGH PROTEIN PO LIQD
237.0000 mL | Freq: Two times a day (BID) | ORAL | Status: DC
  Administered 2024-08-30 – 2024-08-31 (×2): 237 mL via ORAL

## 2024-08-30 MED ORDER — OXYCODONE HCL 5 MG PO TABS: 5.0000 mg | ORAL_TABLET | Freq: Four times a day (QID) | ORAL | Status: DC | PRN

## 2024-08-30 MED ORDER — OXYCODONE HCL 5 MG PO TABS
5.0000 mg | ORAL_TABLET | Freq: Four times a day (QID) | ORAL | Status: DC | PRN
  Administered 2024-08-30 – 2024-09-02 (×7): 5 mg via ORAL
  Filled 2024-08-30 (×5): qty 1

## 2024-08-30 MED ORDER — CARVEDILOL 6.25 MG PO TABS
6.2500 mg | ORAL_TABLET | Freq: Two times a day (BID) | ORAL | Status: DC
  Administered 2024-08-30 – 2024-09-01 (×4): 6.25 mg via ORAL
  Filled 2024-08-30: qty 2
  Filled 2024-08-30 (×3): qty 1
  Filled 2024-08-30: qty 2

## 2024-08-30 NOTE — Progress Notes (Signed)
 [facility]   ROUNDING   NOTE MARIE CHOW FMW:[id number]  DOB: 07-25-1956  DOA: 08/25/2024  PCP: Montey Lot, PA-C  08/30/2024,5:14 AM  LOS: 5 days    Code Status: DNR     [name]   68 year old male ESRD TTS left AV graft placed 07/21/2020 CAD PCI RCA stent 2016-NSTEMI 11/24/2023 rein-stent stenosis Rx LCx DES-unable to expand stents completely due to severe calcification in RCA Previous large pericardial effusion with tamponade unclear etiology status post cardiocentesis June 2025 admission + symptomatic bradycardia in setting of unclear etiology Bilateral BKA with chronic pain Concern for dysphagia 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 Last hospitalized 12/2-12/3 with volume overload hyperkalemia and required an additional HD session despite having been admitted from dialysis center--was discharged back to [facility] skilled facility    Pertinent imaging/studies till date  12/16 admit from [facility] in the setting of unresponsiveness during HD-1 hour into HD found to be unresponsive with labored breathing pinpoint pupils-etiology = Fentanyl  patch X2 on patient-A-fib RVR started on Cardizem -2/2 persisting encephalopathy multiple rounds of Narcan  intubated admitted to ICU-nephrology consulted on arrival 12/18 cardiology consulted-notes indicate marked bradycardia discontinuation of amiodarone  02/28/2024 2/2 tachybradycardia syndrome patient was NOT felt to be a PPM candidate 12/18 neurology consulted for opinion regarding continued anticoagulation secondary to MRI showing hemosiderin  12/21 transferred to [facility]   Assessment  & Plan :    Shock physiology undifferentiated probably hypovolemic Undifferentiated shock was probably secondary to hypotension in the setting of overmedication with fentanyl -also is on Requip  2 at bedtime Percocet  1 every 8 as needed which have all been held This is improved overall  Acute metabolic encephalopathy secondary to  iatrogenic opiate overdose superimposed on ESRD Seems resolved overall  ESRD TTS Defer to renal-continue renal meds For anemia of renal disease defer to renal with regards to next check-is on Venofer  typically TTS or as per them  Tachybradycardia syndrome CAD stents 2016 NSTEMI 2025 Pericardial effusion status post pericardial centesis HFpEF-echo this admission EF 55-60% Severe aortic valve calcification with moderate to severe aortic valve stenosis gradient 23 mmHg Rapid A-fib with difficulty controlling heart rate noted which has been chronic-unfortunately was hypotensive on amiodarone  We will give a small dose of Coreg  6.25 twice daily and see how his heart rate response to the same as he is not a pacemaker candidate--neurology consulted earlier in hospital stay and feels benefit anticoagulation >risk hemorrhage to brain so continues apixaban  5 twice daily Check magnesium  and other electrolytes in the morning  diabetes mellitus A1c 6.9 Well-controlled diabetes-was on FlexPen as an outpatient, CBG here is 150-180   sacral decubiti  not visualized by me today   bilateral BKA Lumbago Having some back pain so started back on low-dose of Oxy IR 5 every 6 as needed  Digital ischemia  Unclear when this occurred-Will ask orthopedics for an opinion get wound care consult at this time-looks like dry gangrene so may demarcate and may fall off   he has another finger on the opposite hand on left side which he injured at tHD which is chronic   Data Reviewed today:  WBC 8.6 hemoglobin 12.2 platelet 163  sodium 135 potassium 4.1 chloride 92 BUN/creatinine 39/4.9 anion gap 20  DVT prophylaxis:  apixaban   Dispo/Global plan:  awaiting durable improvement of overall condition   Time   60   Subjective:    having severe back pain-noted fingers have injuries No chest pain no fever  Objective + exam Vitals:  08/29/24 2200 08/29/24 2300 08/30/24 0000 08/30/24 0400  BP: 138/77 138/76  131/80   Pulse: (!) 114 (!) 111 (!) 118   Resp: 17 16 (!) 21   Temp:   99.6 F (37.6 C) 98.9 F (37.2 C)  TempSrc:   Oral Oral  SpO2: 96% 96% 95%   Weight:      Height:       Filed Weights   08/29/24 0500 08/29/24 1406 08/29/24 1727  Weight: 122.5 kg 122.5 kg 119.5 kg     Examination: EOMI NCAT no focal deficit no icterus no pallor no wheeze no rales CTAB no added sound S1-S2 no murmur next time power 5/5 Afebrile monitors ROM intact neuro is intact moving limbs equally he has bilateral BKA's     Scheduled Meds:  apixaban   5 mg Oral BID   Chlorhexidine  Gluconate Cloth  6 each Topical Daily   Chlorhexidine  Gluconate Cloth  6 each Topical Q0600   doxercalciferol   2 mcg Intravenous Q M,W,F-HD   gabapentin   100 mg Oral TID   insulin  aspart  0-6 Units Subcutaneous Q4H   multivitamin  1 tablet Oral QHS   mupirocin  ointment  1 Application Nasal BID   mouth rinse  15 mL Mouth Rinse 4 times per day   pantoprazole  (PROTONIX ) IV  40 mg Intravenous QHS   polyethylene glycol  17 g Oral Daily   senna-docusate  1 tablet Oral BID   sevelamer  carbonate  800 mg Oral TID WC   Continuous Infusions:  albumin  human     albumin  human     norepinephrine  (LEVOPHED ) Adult infusion Stopped (08/27/24 0521)   albumin  human, albumin  human, fentaNYL , mouth rinse, polyethylene glycol  Jai-Gurmukh Aseel Truxillo, MD  [facility]

## 2024-08-30 NOTE — Plan of Care (Signed)
  Problem: Education: Goal: Ability to describe self-care measures that may prevent or decrease complications (Diabetes Survival Skills Education) will improve Outcome: Progressing Goal: Individualized Educational Video(s) Outcome: Progressing   Problem: Coping: Goal: Ability to adjust to condition or change in health will improve Outcome: Progressing   Problem: Fluid Volume: Goal: Ability to maintain a balanced intake and output will improve Outcome: Progressing   Problem: Health Behavior/Discharge Planning: Goal: Ability to identify and utilize available resources and services will improve Outcome: Progressing Goal: Ability to manage health-related needs will improve Outcome: Progressing   Problem: Metabolic: Goal: Ability to maintain appropriate glucose levels will improve Outcome: Progressing   Problem: Nutritional: Goal: Maintenance of adequate nutrition will improve Outcome: Progressing Goal: Progress toward achieving an optimal weight will improve Outcome: Progressing   Problem: Skin Integrity: Goal: Risk for impaired skin integrity will decrease Outcome: Progressing   Problem: Tissue Perfusion: Goal: Adequacy of tissue perfusion will improve Outcome: Progressing   Problem: Education: Goal: Knowledge of General Education information will improve Description: Including pain rating scale, medication(s)/side effects and non-pharmacologic comfort measures Outcome: Progressing   Problem: Health Behavior/Discharge Planning: Goal: Ability to manage health-related needs will improve Outcome: Progressing   Problem: Clinical Measurements: Goal: Ability to maintain clinical measurements within normal limits will improve Outcome: Progressing Goal: Will remain free from infection Outcome: Progressing Goal: Diagnostic test results will improve Outcome: Progressing Goal: Respiratory complications will improve Outcome: Progressing Goal: Cardiovascular complication will  be avoided Outcome: Progressing   Problem: Activity: Goal: Risk for activity intolerance will decrease Outcome: Progressing   Problem: Nutrition: Goal: Adequate nutrition will be maintained Outcome: Progressing   Problem: Coping: Goal: Level of anxiety will decrease Outcome: Progressing   Problem: Elimination: Goal: Will not experience complications related to bowel motility Outcome: Progressing Goal: Will not experience complications related to urinary retention Outcome: Progressing   Problem: Pain Managment: Goal: General experience of comfort will improve and/or be controlled Outcome: Progressing   Problem: Safety: Goal: Ability to remain free from injury will improve Outcome: Progressing   Problem: Skin Integrity: Goal: Risk for impaired skin integrity will decrease Outcome: Progressing   Problem: Safety: Goal: Non-violent Restraint(s) Outcome: Progressing

## 2024-08-30 NOTE — Progress Notes (Signed)
 [facility] Progress Note  Subjective:  Seen in room, extubated yesterday Alert and talkative today  Presentation summary: 68 y.o. year-old w/ PMH as below who presented to ED yesterday afternoon sent from dialysis.  Patient was dialyzed for 1 hour and then went staff went to check on him he was minimally responsive.  His prior dialysis was a 4-week ago and the wife said he had 20 kg on.  EMS came and gave DuoNeb, he was 91% on room room air and 95 after nebs.  Was hospitalized 2 weeks ago for pneumonia.  In the ED patient remained unresponsive with labored breathing and pinpoint pupils.  A fentanyl  patch was found applied to his chest and he received Narcan  and had a good response.  Then he went into A-fib with RVR and remained encephalopathic despite additional Narcan  so was subsequently intubated for airway support.  IV Levophed  was started for hypotension with blood pressures in the 70s and significant volume overload was noted.  Per the H&P the wife initially made patient a DNR-L to the ED provider over the phone, however then she changed her mind to make the patient a full code.  Per the ICU note the wife is understanding of his serious comorbid disease and overall poor health and would likely consider stopping prolonged resuscitative efforts if outcome appears futile.   Critical care team admitted to ICU.  We are asked to see for ESRD.   Vitals:   08/30/24 0500 08/30/24 0600 08/30/24 0800 08/30/24 0900  BP: 128/78 132/84 137/77 (!) 153/85  Pulse: (!) 104 (!) 118 (!) 111 97  Resp: 16 (!) 24 19 14   Temp:   99 F (37.2 C)   TempSrc:   Oral   SpO2: 95% 98% 97% 96%  Weight:      Height:        Exam: Gen alert, responsive, no distress Mild jvd  Chest clear anterior/ lateral RRR no MRG Abd soft ntnd no mass or ascites +bs Ext 2+ bilat stump/ hip edema, improving Neuro is alert, deconditioned    LUA aVF+bruit     Home bp meds: Norvasc     [facility]  20[facility] -> 4h  B450   108kg AVF   Heparin  none      Assessment/ Plan ESRD: on HD TTS. Had HD here Wed, Friday and Sat. Next HD Monday (holiday schedule).  Shock: IV cefepime / vanc given initially, then were dc'd. Shock resolved. No longer ICU patient.  Volume: diffuse vol overload/ LE edema, acute on chronic issue. Initial CXR + vasc congestion and intubated. Now is on RA. Was 19kg over, now is 11 kg over. Cont to lower vol max UF w/ HD.  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 Extubated 12/20 and is now alert and interacting.  Afib/ RVR: on eliquis , don't see any rate controlling agents     Myer Fret MD  [facility] 08/30/2024, 9:55 AM  Recent Labs  Lab 08/25/24 2032 08/26/24 1301 08/29/24 0857 08/30/24 0417 08/30/24 0621  HGB  --    < > 12.2* 12.2*  --   ALBUMIN  3.7  --   --   --   --   CALCIUM  8.2*   < > 9.1  --  9.4  PHOS 7.1*   < > 6.2*  --  5.4*  CREATININE 7.16*   < > 5.75*  --  4.95*  K 5.6*   < > 4.7  --  4.1   < > = values in this interval not displayed.   No results for input(s): IRON, TIBC, FERRITIN in the last 168 hours. Inpatient medications:  apixaban   5 mg Oral BID   Chlorhexidine  Gluconate Cloth  6 each Topical Daily   Chlorhexidine  Gluconate Cloth  6 each Topical Q0600   doxercalciferol   2 mcg Intravenous Q M,W,F-HD   feeding supplement  237 mL Oral BID BM   gabapentin   100 mg Oral TID   insulin  aspart  0-6 Units Subcutaneous Q4H   multivitamin  1 tablet Oral QHS   mupirocin  ointment  1 Application Nasal BID   mouth rinse  15 mL Mouth Rinse 4 times per day   pantoprazole  (PROTONIX ) IV  40 mg Intravenous QHS   polyethylene glycol  17 g Oral Daily   senna-docusate  1 tablet Oral BID   sevelamer  carbonate  800 mg Oral TID WC    albumin  human     albumin  human     norepinephrine  (LEVOPHED ) Adult infusion Stopped (08/27/24 0521)   albumin  human, albumin  human,  fentaNYL , mouth rinse, oxyCODONE , polyethylene glycol

## 2024-08-31 ENCOUNTER — Inpatient Hospital Stay (HOSPITAL_COMMUNITY)

## 2024-08-31 ENCOUNTER — Other Ambulatory Visit: Payer: Self-pay

## 2024-08-31 ENCOUNTER — Encounter (HOSPITAL_COMMUNITY): Payer: Self-pay | Admitting: Critical Care Medicine

## 2024-08-31 DIAGNOSIS — R4182 Altered mental status, unspecified: Secondary | ICD-10-CM | POA: Diagnosis not present

## 2024-08-31 LAB — GLUCOSE, CAPILLARY
Glucose-Capillary: 134 mg/dL — ABNORMAL HIGH (ref 70–99)
Glucose-Capillary: 151 mg/dL — ABNORMAL HIGH (ref 70–99)
Glucose-Capillary: 152 mg/dL — ABNORMAL HIGH (ref 70–99)
Glucose-Capillary: 154 mg/dL — ABNORMAL HIGH (ref 70–99)
Glucose-Capillary: 185 mg/dL — ABNORMAL HIGH (ref 70–99)

## 2024-08-31 LAB — CBC WITH DIFFERENTIAL/PLATELET
Abs Immature Granulocytes: 0.02 K/uL (ref 0.00–0.07)
Basophils Absolute: 0.1 K/uL (ref 0.0–0.1)
Basophils Relative: 1 %
Eosinophils Absolute: 0.4 K/uL (ref 0.0–0.5)
Eosinophils Relative: 6 %
HCT: 37.5 % — ABNORMAL LOW (ref 39.0–52.0)
Hemoglobin: 11.8 g/dL — ABNORMAL LOW (ref 13.0–17.0)
Immature Granulocytes: 0 %
Lymphocytes Relative: 15 %
Lymphs Abs: 0.9 K/uL (ref 0.7–4.0)
MCH: 27.3 pg (ref 26.0–34.0)
MCHC: 31.5 g/dL (ref 30.0–36.0)
MCV: 86.8 fL (ref 80.0–100.0)
Monocytes Absolute: 0.8 K/uL (ref 0.1–1.0)
Monocytes Relative: 13 %
Neutro Abs: 4.1 K/uL (ref 1.7–7.7)
Neutrophils Relative %: 65 %
Platelets: 171 K/uL (ref 150–400)
RBC: 4.32 MIL/uL (ref 4.22–5.81)
RDW: 17.3 % — ABNORMAL HIGH (ref 11.5–15.5)
WBC: 6.2 K/uL (ref 4.0–10.5)
nRBC: 0 % (ref 0.0–0.2)

## 2024-08-31 LAB — BASIC METABOLIC PANEL WITH GFR
Anion gap: 18 — ABNORMAL HIGH (ref 5–15)
BUN: 52 mg/dL — ABNORMAL HIGH (ref 8–23)
CO2: 24 mmol/L (ref 22–32)
Calcium: 9.3 mg/dL (ref 8.9–10.3)
Chloride: 94 mmol/L — ABNORMAL LOW (ref 98–111)
Creatinine, Ser: 6.26 mg/dL — ABNORMAL HIGH (ref 0.61–1.24)
GFR, Estimated: 9 mL/min — ABNORMAL LOW
Glucose, Bld: 173 mg/dL — ABNORMAL HIGH (ref 70–99)
Potassium: 4.2 mmol/L (ref 3.5–5.1)
Sodium: 135 mmol/L (ref 135–145)

## 2024-08-31 LAB — CULTURE, BLOOD (ROUTINE X 2): Culture: NO GROWTH

## 2024-08-31 MED ORDER — HEPARIN SODIUM (PORCINE) 1000 UNIT/ML DIALYSIS: 1000.0000 [IU] | INTRAMUSCULAR | Status: DC | PRN

## 2024-08-31 MED ORDER — NEPRO/CARBSTEADY PO LIQD
237.0000 mL | Freq: Two times a day (BID) | ORAL | Status: DC
  Administered 2024-08-31 – 2024-09-01 (×3): 237 mL via ORAL

## 2024-08-31 MED ORDER — LIDOCAINE-PRILOCAINE 2.5-2.5 % EX CREA: 1.0000 | TOPICAL_CREAM | CUTANEOUS | Status: DC | PRN

## 2024-08-31 MED ORDER — DOXERCALCIFEROL 4 MCG/2ML IV SOLN
INTRAVENOUS | Status: AC
  Filled 2024-08-31: qty 2

## 2024-08-31 MED ORDER — LIDOCAINE HCL (PF) 1 % IJ SOLN: 5.0000 mL | INTRAMUSCULAR | Status: DC | PRN

## 2024-08-31 MED ORDER — NEPRO/CARBSTEADY PO LIQD: 237.0000 mL | ORAL | Status: DC | PRN

## 2024-08-31 MED ORDER — ALTEPLASE 2 MG IJ SOLR: 2.0000 mg | Freq: Once | INTRAMUSCULAR | Status: DC | PRN

## 2024-08-31 MED ORDER — PENTAFLUOROPROP-TETRAFLUOROETH EX AERO: 1.0000 | INHALATION_SPRAY | CUTANEOUS | Status: DC | PRN

## 2024-08-31 MED ORDER — ANTICOAGULANT SODIUM CITRATE 4% (200MG/5ML) IV SOLN: 5.0000 mL | Status: DC | PRN

## 2024-08-31 MED ORDER — OXYCODONE HCL 5 MG PO TABS
ORAL_TABLET | ORAL | Status: AC
  Filled 2024-08-31: qty 1

## 2024-08-31 MED ORDER — JUVEN PO PACK
1.0000 | PACK | Freq: Two times a day (BID) | ORAL | Status: DC
  Administered 2024-08-31 – 2024-09-02 (×4): 1 via ORAL
  Filled 2024-08-31 (×3): qty 1

## 2024-08-31 NOTE — Plan of Care (Signed)

## 2024-08-31 NOTE — Consult Note (Signed)
 Orthopaedic Surgery Hand and Upper Extremity History and Physical Examination 08/31/2024   CC: Left long finger wound  HPI: [name] is a 68 y.o. male with chronic wound on dorsal surface of left long finger over PIP joint as well as left ring finger tip pressor induced digital ischemia.  He reports the chronic wound on the left long finger has been there for at least a month or so since getting injured from his wheel chair.  He reports it is not significantly painful unless bumped.   Past Medical History: Past Medical History:  Diagnosis Date   Carotid artery occlusion 11/10/2010   LEFT CAROTID ENDARTERECTOMY   Complication of anesthesia    BP WENT UP AT [facility]    COPD (chronic obstructive pulmonary disease) (HCC)    pt denies this dx as of 06/01/20 - no inhaler    Diabetes mellitus without complication (HCC)    Diverticulitis    Diverticulosis of colon (without mention of hemorrhage)    DJD (degenerative joint disease)    knees/hands/feet/back/neck   ESRD (end stage renal disease) on dialysis (HCC)    Fatty liver    Full dentures    GERD (gastroesophageal reflux disease)    H/O hiatal hernia    History of blood transfusion    with a past surical procedure per patient 06/01/20   History of right below knee amputation (HCC) 07/12/2016   Hyperlipidemia    Hypertension    Neuromuscular disorder (HCC)    peripheral neuropathy   Non-pressure chronic ulcer of other part of left foot limited to breakdown of skin (HCC) 11/12/2016   Nonrheumatic aortic (valve) stenosis 03/18/2024   Limited TTE 03/18/2024: EF 55-60, moderate LVH, normal RVSF, moderate LAE, mild MR, mild-moderate AS (mean 12, V-max 235 cm/s, DI 0.35), aortic root 39 mm, moderate pleural effusion, trivial pericardial effusion    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 Atherectomy left popliteal artery July 2012.    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   Unstable angina (HCC) 09/16/2018   Wears glasses      Medications: Scheduled Meds:  apixaban   5 mg Oral BID   carvedilol   6.25 mg Oral BID WC   Chlorhexidine  Gluconate Cloth  6 each Topical Daily   Chlorhexidine  Gluconate Cloth  6 each Topical Q0600   doxercalciferol   2 mcg Intravenous Q M,W,F-HD   feeding supplement (NEPRO CARB STEADY)  237 mL Oral BID BM   gabapentin   100 mg Oral TID   insulin  aspart  0-6 Units Subcutaneous Q4H   multivitamin  1 tablet Oral QHS   nutrition supplement (JUVEN)  1 packet Oral BID BM   mouth rinse  15 mL Mouth Rinse 4 times per day   pantoprazole  (PROTONIX ) IV  40 mg Intravenous QHS   polyethylene glycol  17 g Oral Daily   senna-docusate  1 tablet Oral BID   sevelamer  carbonate  800 mg Oral TID WC   Continuous Infusions: PRN Meds:.mouth rinse, oxyCODONE , polyethylene glycol  Allergies: Allergies as of 08/25/2024 - Unable to Assess 08/25/2024  Allergen Reaction Noted   Contrast media [iodinated contrast media] Shortness  Of Breath and Other (See Comments) 09/16/2018   Ivp dye [iodinated contrast media] Anaphylaxis, Shortness Of Breath, and Other (See Comments) 05/27/2013   Latex Rash 09/02/2021   Tape Rash and Other (See Comments) 10/31/2011    Past Surgical History: Past Surgical History:  Procedure Laterality Date   ABDOMINAL AORTOGRAM W/LOWER EXTREMITY N/A 06/23/2021   Procedure: ABDOMINAL AORTOGRAM W/LOWER EXTREMITY;  Surgeon: Elmira Newman PARAS, MD;  [facility]: [facility];  Service: Cardiovascular;  Laterality: N/A;   AMPUTATION  11/05/2011   Procedure: AMPUTATION RAY;  Surgeon: Norleen Armor, MD;  Location: [facility] OR;  Service: Orthopedics;  Laterality: Right;  Amputation of Right 4&5th Toes    AMPUTATION Left 11/26/2012   Procedure: AMPUTATION RAY;  Surgeon: Norleen Armor, MD;  Location: [facility] OR;  Service: Orthopedics;  Laterality: Left;  fourth ray amputation   AMPUTATION Right 08/27/2014   Procedure: Transmetatarsal Amputation;  Surgeon: Jerona Harden GAILS, MD;  Location: [facility] OR;  Service: Orthopedics;  Laterality: Right;   AMPUTATION Right 01/14/2015   Procedure: AMPUTATION BELOW KNEE;  Surgeon: Jerona Harden GAILS, MD;  Location: [facility] OR;  Service: Orthopedics;  Laterality: Right;   AMPUTATION Left 10/21/2015   Procedure: Left Foot 5th Ray Amputation;  Surgeon: Jerona Harden GAILS, MD;  Location: [facility] OR;  Service: Orthopedics;  Laterality: Left;   AMPUTATION Left 07/04/2022   Procedure: LEFT BELOW KNEE AMPUTATION;  Surgeon: Harden Jerona GAILS, MD;  Location: [facility] OR;  Service: Orthopedics;  Laterality: Left;   ANTERIOR FUSION CERVICAL SPINE  02/06/2006   C4-5, C5-6, C6-7; SURGEON DR. MAX COHEN   AV FISTULA PLACEMENT Left 06/02/2020   Procedure: ARTERIOVENOUS (AV) FISTULA CREATION LEFT;  Surgeon: Sheree Penne Bruckner, MD;  Location: [facility] OR;  Service: Vascular;  Laterality: Left;   BACK SURGERY     x 3   BASCILIC VEIN TRANSPOSITION Left 07/21/2020   Procedure: LEFT UPPER ARM ATERIOVENOUS SUPERFISTULALIZATION;  Surgeon: Sheree Penne Bruckner, MD;  Location: [facility] OR;  Service: Vascular;  Laterality: Left;   BELOW KNEE LEG AMPUTATION Right    CARDIAC CATHETERIZATION  10/31/2004   2009   CAROTID ENDARTERECTOMY  11/10/2010   CAROTID ENDARTERECTOMY Left 11/10/2010   Subtotal occlusion of left internal carotid artery with left hemispheric transient ischemic attacks.   CAROTID STENT     CARPAL TUNNEL RELEASE Right 10/21/2013   Procedure: RIGHT CARPAL TUNNEL RELEASE;  Surgeon: Arley JONELLE Curia, MD;  Location: [facility];  Service: Orthopedics;  Laterality: Right;   CHOLECYSTECTOMY     COLON SURGERY     COLONOSCOPY     COLOSTOMY REVERSAL  05/21/2018   ileostomy reversal   CORONARY LITHOTRIPSY  N/A 11/24/2023   Procedure: CORONARY LITHOTRIPSY;  Surgeon: Jordan, Peter M, MD;  [facility] CV LAB;  Service: Cardiovascular;  Laterality: N/A;   CORONARY STENT INTERVENTION N/A 11/24/2023   Procedure: CORONARY STENT INTERVENTION;  Surgeon: Jordan, Peter M, MD;  Location: [facility] INVASIVE CV LAB;  Service: Cardiovascular;  Laterality: N/A;   CYSTOSCOPY WITH STENT PLACEMENT Bilateral 01/13/2018   Procedure: CYSTOSCOPY WITH BILATERAL URETERAL CATHETER PLACEMENT;  Surgeon: Cam Morene ORN, MD;  Location: [facility] ORS;  Service: Urology;  Laterality: Bilateral;   ESOPHAGEAL MANOMETRY Bilateral 07/19/2014   Procedure: ESOPHAGEAL MANOMETRY (EM);  Surgeon: Gordy CHRISTELLA Starch, MD;  Location: [facility];  Service: Gastroenterology;  Laterality: Bilateral;   EYE SURGERY Bilateral 2020   cataract   FEMORAL ARTERY STENT     x6   FINGER SURGERY  FOOT SURGERY  04/25/2016    EXCISION BASE 5TH METATARSAL AND PARTIAL CUBOID LEFT FOOT   HERNIA REPAIR     LEFT INGUINAL AND UMBILICAL REPAIRS   HERNIA REPAIR     I & D EXTREMITY Left 04/25/2016   Procedure: EXCISION BASE 5TH METATARSAL AND PARTIAL CUBOID LEFT FOOT;  Surgeon: Jerona LULLA Sage, MD;  Location: [facility] OR;  Service: Orthopedics;  Laterality: Left;   ILEOSTOMY  01/13/2018   Procedure: ILEOSTOMY;  Surgeon: Signe Mitzie LABOR, MD;  Location: [facility]S;  Service: General;;   ILEOSTOMY CLOSURE N/A 05/21/2018   Procedure: ILEOSTOMY REVERSAL ERAS PATHWAY;  Surgeon: Signe Mitzie LABOR, MD;  Location: [facility];  Service: General;  Laterality: N/A;   IR RADIOLOGIST EVAL & MGMT  11/19/2017   IR RADIOLOGIST EVAL & MGMT  12/03/2017   IR RADIOLOGIST EVAL & MGMT  12/18/2017   JOINT REPLACEMENT Right 2001   Total knee   LAMINECTOMY     X 3 LUMBAR AND X 2 CERVICAL SPINE OPERATIONS   LAPAROSCOPIC CHOLECYSTECTOMY W/ CHOLANGIOGRAPHY  11/09/2004   SURGEON DR. DEWARD CANDIE NULL   LEFT HEART CATH AND CORONARY ANGIOGRAPHY N/A 09/16/2018   Procedure: LEFT HEART CATH AND CORONARY  ANGIOGRAPHY;  Surgeon: Elmira Newman PARAS, MD;  [facility];  Service: Cardiovascular;  Laterality: N/A;   LEFT HEART CATH AND CORONARY ANGIOGRAPHY N/A 11/24/2023   Procedure: LEFT HEART CATH AND CORONARY ANGIOGRAPHY;  Surgeon: Jordan, Peter M, MD;  Location: [facility]VAS[facility] CV LAB;  Service: Cardiovascular;  Laterality: N/A;   LEFT HEART CATHETERIZATION WITH CORONARY ANGIOGRAM N/A 10/29/2014   Procedure: LEFT HEART CATHETERIZATION WITH CORONARY ANGIOGRAM;  Surgeon: Erick JONELLE Bergamo, MD;  Location: [facility] CATH LAB;  Service: Cardiovascular;  Laterality: N/A;   LIGATION OF COMPETING BRANCHES OF ARTERIOVENOUS FISTULA Left 07/21/2020   Procedure: LIGATION OF COMPETING BRANCHES OF LEFT UPPER ARM ARTERIOVENOUS FISTULA;  Surgeon: Sheree Penne Bruckner, MD;  Location: [facility] OR;  Service: Vascular;  Laterality: Left;   LOWER EXTREMITY ANGIOGRAM N/A 03/19/2012   Procedure: LOWER EXTREMITY ANGIOGRAM;  Surgeon: Bruckner JONETTA Cash, MD;  Location: [facility] CATH LAB;  Service: Cardiovascular;  Laterality: N/A;   LOWER EXTREMITY ANGIOGRAPHY N/A 06/20/2021   Procedure: LOWER EXTREMITY ANGIOGRAPHY;  Surgeon: Elmira Newman PARAS, MD;  [facility];  Service: Cardiovascular;  Laterality: N/A;   NECK SURGERY     PARTIAL COLECTOMY N/A 01/13/2018   Procedure: LAPAROSCOPIC ASSISTED   SIGMOID COLECTOMY ILEOSTOMY;  Surgeon: Signe Mitzie LABOR, MD;  Location: [facility]S;  Service: General;  Laterality: N/A;   PENILE PROSTHESIS IMPLANT  08/14/2005   INFRAPUBIC INSERTION OF INFLATABLE PENILE PROSTHESIS; SURGEON DR. JANIT   PENILE PROSTHESIS IMPLANT     PERCUTANEOUS CORONARY STENT INTERVENTION (PCI-S) Right 10/29/2014   Procedure: PERCUTANEOUS CORONARY STENT INTERVENTION (PCI-S);  Surgeon: Erick JONELLE Bergamo, MD;  Location: [facility] CATH LAB;  Service: Cardiovascular;  Laterality: Right;   PERICARDIOCENTESIS N/A 02/13/2024   Procedure: PERICARDIOCENTESIS;  Surgeon: Elmira Newman PARAS, MD;  [facility]  INV[facility]IVE CV LAB;  Service: Cardiovascular;  Laterality: N/A;   PERIPHERAL VASCULAR INTERVENTION Left 06/23/2021   Procedure: PERIPHERAL VASCULAR INTERVENTION;  Surgeon: Elmira Newman PARAS, MD;  [facility];  Service: Cardiovascular;  Laterality: Left;   REMOVAL OF PENILE PROSTHESIS N/A 06/14/2021   Procedure: Removal of THREE piece inflatable penile prosthesis;  Surgeon: Carolee Sherwood JONETTA DOUGLAS, MD;  Location: [facility] OR;  Service: Urology;  Laterality: N/A;   SHOULDER ARTHROSCOPY  SPINE SURGERY     TEMPORARY PACEMAKER N/A 02/13/2024   Procedure: TEMPORARY PACEMAKER;  Surgeon: Elmira Newman PARAS, MD;  [facility];  Service: Cardiovascular;  Laterality: N/A;   TOE AMPUTATION Left    TONSILLECTOMY     TOTAL KNEE ARTHROPLASTY  07/2002   RIGHT KNEE ; SURGEON  DR. [name] ALSO HAD ARTHROSCOPIC RIGHT KNEE IN  10/2001   TOTAL KNEE ARTHROPLASTY     ULNAR NERVE TRANSPOSITION Right 10/21/2013   Procedure: RIGHT ELBOW  ULNAR NERVE DECOMPRESSION;  Surgeon: Arley JONELLE Curia, MD;  Location: [facility];  Service: Orthopedics;  Laterality: Right;     Social History: Social History   Occupational History   Occupation: Publishing Rights Manager: UNEMPLOYED  Tobacco Use   Smoking status: Every Day    Current packs/day: 1.00    Average packs/day: 1 pack/day for 35.0 years (35.0 ttl pk-yrs)    Types: Cigarettes   Smokeless tobacco: Never  Vaping Use   Vaping status: Every Day   Substances: Nicotine   Substance and Sexual Activity   Alcohol  use: Not Currently    Comment: not in a long time   Drug use: Never   Sexual activity: Yes    Birth control/protection: Implant    Comment: penile implant     Family History: Family History  Problem Relation Age of Onset   Heart disease Father        Before age 3-  CAD, BPG   Diabetes Father        Amputation   Cancer Father        PROSTATE   Hyperlipidemia Father    Hypertension Father    Heart attack  Father        Triple BPG   Varicose Veins Father    Cancer Sister        Breast   Hyperlipidemia Sister    Hypertension Sister    Heart attack Brother    Colon cancer Brother    Diabetes Brother    Heart disease Brother 63       A-Fib. Before age 57   Hyperlipidemia Brother    Hypertension Brother    Hypertension Son    Arthritis Other        GRANDMOTHER   Hypertension Other        OTHER FAMILY MEMBERS   Otherwise, no relevant orthopaedic family history  ROS: Review of Systems: All systems reviewed and are negative except that mentioned in HPI  Work/Sport/Hobbies: See HPI  Physical Examination: Vitals:   08/31/24 1611 08/31/24 1617  BP: 106/89   Pulse: (!) 114   Resp: 17 18  Temp: 97.9 F (36.6 C)   SpO2:     Constitutional: Awake, alert.  WN/WD Appearance: healthy, no acute distress, well-groomed Affect: Normal HEENT: EOMI, mucous membranes moist CV: RRR Pulm: breathing comfortably   Left Upper Extremity / Hand Dorsal ulceration on left long finger over PIP joint.  Able to isolated extend against resistance although not as strong as the index finger extends against resistance when isolated.  Wound probed with cotton-tipped applicator, there is no visible tendon or bone present.  There is minimal erythema or swelling present.  Stiffness in all joints of all digits of the hand. No significant pain with PIP range of motion.  Some necrosis present on ulnar aspect of tip of ring finger.   Pertinent Labs: n/a  Imaging: I have personally reviewed the following studies: Plain film radiographs  of the left hand demonstrate no evidence of osteomyelitis.  Additional Studies: n/a  Assessment/Plan: LEEANDRE NORDLING is a 68 y.o. male with chronic left long finger ulceration as well as left ring finger pressor-induced ischemic necrosis.   -For the left long finger, given the intact extensor mechanism, an inability to probe to the tendon or joint, minimal erythema or  swelling and no evidence of osteomyelitis on x-ray, the likelihood of septic arthritis is minimal.  No surgical indications at this time.   -recommend in patient wound care consult while patient remains admitted.  -for the left ring finger, recommend daily betadine painting of the necrotic region as well as the adjacent region to prevent infection while letting the region of necrosis declare itself.   Marsa Christen, MD Hand and Upper Extremity Surgery [facility] [place] [phone] 08/31/2024 5:58 PM

## 2024-08-31 NOTE — Progress Notes (Signed)
 Nutrition Follow-up  DOCUMENTATION CODES:  Not applicable  INTERVENTION:  Change Ensure to Nepro BID for better K/Phos management. Each supplement provides 425 Kcals and 19 grams protein. 1 packet Juven BID to support wound healing. Each packet provides 95 calories, 2.5 grams of protein (collagen), and 9.8 grams of carbohydrate (3 grams sugar); also contains 7 grams of L-arginine and L-glutamine, 300 mg vitamin C , 15 mg vitamin E, 1.2 mcg vitamin B-12, 9.5 mg zinc , 200 mg calcium , and 1.5 g Calcium  Beta-hydroxy-Beta-methylbutyrate. Continue Dysphagia 2 diet with thin liquids. Diet upgrade per SLP. Continue renal multivitamin PO once daily.   NUTRITION DIAGNOSIS:  Inadequate oral intake related to inability to eat as evidenced by NPO status - resolved with diet advancement, continue to monitor  GOAL:  Patient will meet greater than or equal to 90% of their needs - progressing  MONITOR:  I & O's, Labs, Weight trends, PO intake, Supplement acceptance, Diet advancement, Skin  REASON FOR ASSESSMENT:  Ventilator    ASSESSMENT:  Patient presented from SNF with unresponsiveness during HD with suspected opiate intoxication and severe volume overload requiring intubation. PMH significant for DM2, ESRD on HD, recent PNA, COPD, OSA, Afib, CAD s/p stents 2016, NSTEMI 11/2023, CHF, pericardial effusion 02/2024, HTN, PAD s/p bilateral BKA, dyslipidemia, recurrent C diff, hiatal hernia, diverticulitis, and chronic pain.  12/16 intubated for acute hypoxic respiratory failure 2/2 volume overload and likely opioid-induced respiratory depression. 12/19 extubated 12/20 passed SLP swallow eval with pureed/thin, lethargic 12/22 upgraded to Dysphagia 2/thin  Visited the patient who states that he is eating well. He had decreased PO intake for 4-5 days prior to admission. He does not know his usual body weight but states it has been stable. He was disinterested in communicating his typical food intake for  nutrition adequacy assessment.  Scheduled Meds:  apixaban   5 mg Oral BID   carvedilol   6.25 mg Oral BID WC   Chlorhexidine  Gluconate Cloth  6 each Topical Daily   Chlorhexidine  Gluconate Cloth  6 each Topical Q0600   doxercalciferol   2 mcg Intravenous Q M,W,F-HD   feeding supplement  237 mL Oral BID BM   gabapentin   100 mg Oral TID   insulin  aspart  0-6 Units Subcutaneous Q4H   multivitamin  1 tablet Oral QHS   mouth rinse  15 mL Mouth Rinse 4 times per day   pantoprazole  (PROTONIX ) IV  40 mg Intravenous QHS   polyethylene glycol  17 g Oral Daily   senna-docusate  1 tablet Oral BID   sevelamer  carbonate  800 mg Oral TID WC   Continuous Infusions:  albumin  human     PRN Meds:.albumin  human, fentaNYL , mouth rinse, oxyCODONE , polyethylene glycol  Diet Order             DIET DYS 2 Fluid consistency: Thin  Diet effective now                  Meal Intake: 100%  Labs:     Latest Ref Rng & Units 08/31/2024    5:54 AM 08/30/2024    6:21 AM 08/29/2024    8:57 AM  CMP  Glucose 70 - 99 mg/dL 826  845  851   BUN 8 - 23 mg/dL 52  39  47   Creatinine 0.61 - 1.24 mg/dL 3.73  5.04  4.24   Sodium 135 - 145 mmol/L 135  135  133   Potassium 3.5 - 5.1 mmol/L 4.2  4.1  4.7   Chloride  98 - 111 mmol/L 94  92  91   CO2 22 - 32 mmol/L 24  23  18    Calcium  8.9 - 10.3 mg/dL 9.3  9.4  9.1     I/O: -5 L since admit  NUTRITION - FOCUSED PHYSICAL EXAM: Flowsheet Row Most Recent Value  Orbital Region No depletion  Upper Arm Region No depletion  Thoracic and Lumbar Region No depletion  Buccal Region Unable to assess  [ETT Holder]  Temple Region No depletion  Clavicle Bone Region No depletion  Clavicle and Acromion Bone Region No depletion  Scapular Bone Region No depletion  Dorsal Hand No depletion  Patellar Region Unable to assess  Anterior Thigh Region Unable to assess  Posterior Calf Region Unable to assess  [Bilateral BKA]  Edema (RD Assessment) Mild  Hair Reviewed  Eyes  Unable to assess  Mouth Unable to assess  Skin Reviewed  Nails Reviewed    EDUCATION NEEDS:  Education needs have been addressed  Skin:  Skin Assessment: Skin Integrity Issues: Skin Integrity Issues:: Stage II, Stage I Stage I: L thigh Stage II: sacrum  Last BM:  PTA/Unknown  Height:  Ht Readings from Last 1 Encounters:  08/27/24 6' 2 (1.88 m)   Weight:  Wt Readings from Last 10 Encounters:  08/31/24 111.9 kg  08/11/24 115.2 kg  03/28/24 108 kg  03/18/24 113.4 kg  03/09/24 113.4 kg  02/20/24 108.6 kg  02/04/24 116.8 kg  01/01/24 112.5 kg  12/02/23 115.7 kg  11/25/23 113.7 kg   Weight Change: relatively stable weight with possible fluid-related fluctuations  Usual Body Weight: patient does not know but states it has been stable PTA  Edema: generalized, type of edema not charted  Ideal Body Weight:  75 kg (accounting for bilateral BKA)   BMI:  Body mass index is 31.67 kg/m.  Estimated Daily Nutritional Needs:  Kcal:  1900-2200 Protein:  110-130 g Fluid:  >/=1900 mL    Harold Ruth, MS, RDN, LDN [place] See AMION for contact information Secure chat preferred

## 2024-08-31 NOTE — Progress Notes (Signed)
 [facility]   ROUNDING   NOTE [name] FMW:[id number]  DOB: 11-27-1955  DOA: 08/25/2024  PCP: Montey Lot, PA-C  08/31/2024,3:21 PM  LOS: 6 days    Code Status: DNR     from: [name]   68 year old male ESRD TTS left AV graft placed 07/21/2020 CAD PCI RCA stent 2016-NSTEMI 11/24/2023 rein-stent stenosis Rx LCx DES-unable to expand stents completely due to severe calcification in RCA Previous large pericardial effusion with tamponade unclear etiology status post cardiocentesis June 2025 admission + symptomatic bradycardia in setting of unclear etiology Bilateral BKA with chronic pain Concern for dysphagia 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 Last hospitalized 12/2-12/3 with volume overload hyperkalemia and required an additional HD session despite having been admitted from dialysis center--was discharged back to [facility] skilled facility   Pertinent imaging/studies till date  12/16 admit from [facility] in the setting of unresponsiveness during HD-1 hour into HD found to be unresponsive with labored breathing pinpoint pupils-etiology = Fentanyl  patch X2 on patient-A-fib RVR started on Cardizem -2/2 persisting encephalopathy multiple rounds of Narcan  intubated admitted to ICU-nephrology consulted on arrival 12/18 cardiology consulted-notes indicate marked bradycardia discontinuation of amiodarone  02/28/2024 2/2 tachybradycardia syndrome patient was NOT felt to be a PPM candidate 12/18 neurology consulted for opinion regarding continued anticoagulation secondary to MRI showing hemosiderin  12/21 [facility]   Assessment  & Plan :    Shock physiology undifferentiated probably hypovolemic Undifferentiated shock ? 2/2 hypotension in setting of overmedication with fentanyl -/ Requip  2 at bedtime /Percocet   all held This is improved overall  Acute metabolic encephalopathy secondary to iatrogenic opiate overdose superimposed on ESRD Seems resolved  overall  ESRD TTS Defer to renal defer to renal with regards to iron studies--is on Venofer  typically TTS  Tachybradycardia syndrome CAD stents 2016 NSTEMI 2025 Pericardial effusion status post pericardial centesis HFpEF-echo this admission EF 55-60% Severe aortic valve calcification with moderate to severe aortic valve stenosis gradient 23 mmHg Rapid A-fib with difficulty controlling heart rate noted which has been chronic-unfortunately was hypotensive on amiodarone  Started 12/21 Coreg  6.25 twice daily--- is not a pacemaker candidate-- neurology consulted earlier in hospital stay and feels benefit anticoagulation >risk hemorrhage to brain so continues apixaban  5 twice daily Severe AoS will need to be discussed as OP with Cardiology  diabetes mellitus A1c 6.9 Well-controlled diabetes-was on FlexPen as an outpatient, is on very sensitive coverage-CBG here is 130-170   sacral decubiti  not visualized by me today   bilateral BKA Lumbago Having some back pain so started back on low-dose of Oxy IR 5 every 6 as needed  Digital ischemia Per WOC--Right middle finger; cleanse with Vashe Soila # [phone]), pat dry. Apply xeroform and wrap with 1 conform---Right 4th finger, pain with betadine and allow to air dry.   d/w Dr. Lord of Hand Surgery---Get x-rays to ensure no Osteomyelitis---they will see--Ot to eval mobility once cleared by Ortho   Data Reviewed today:    DVT prophylaxis:  apixaban   Dispo/Global plan:  awaiting durable improvement of overall condition, over next several days---liekly SNF level care   Time.;-30   Subjective:   Awake pleasant---just back from HD No pain fever chills n/v Tolerating diet  Objective + exam Vitals:   08/31/24 1121 08/31/24 1123 08/31/24 1315 08/31/24 1335  BP: 136/76 134/76 123/68   Pulse: (!) 112 (!) 104 98 100  Resp: (!) 23 20 20 17   Temp: 97.9 F (36.6 C) 97.9 F (36.6 C) 98.6 F (37  C)   TempSrc:   Oral   SpO2: 98% 96%  99% 100%  Weight:  111.9 kg    Height:       Filed Weights   08/31/24 0404 08/31/24 0735 08/31/24 1123  Weight: 115.6 kg 114.9 kg 111.9 kg   Examination:  EOMI NCAT no focal deficit no icterus no pallor Thick neck Mallampati 4 Chest is clear no wheeze rales rhonchi Abdomen soft no rebound no guarding Digital ischemia remains on index finger of left hand--- middle finger is bandaged  Scheduled Meds:  apixaban   5 mg Oral BID   carvedilol   6.25 mg Oral BID WC   Chlorhexidine  Gluconate Cloth  6 each Topical Daily   Chlorhexidine  Gluconate Cloth  6 each Topical Q0600   doxercalciferol   2 mcg Intravenous Q M,W,F-HD   feeding supplement (NEPRO CARB STEADY)  237 mL Oral BID BM   gabapentin   100 mg Oral TID   insulin  aspart  0-6 Units Subcutaneous Q4H   multivitamin  1 tablet Oral QHS   nutrition supplement (JUVEN)  1 packet Oral BID BM   mouth rinse  15 mL Mouth Rinse 4 times per day   pantoprazole  (PROTONIX ) IV  40 mg Intravenous QHS   polyethylene glycol  17 g Oral Daily   senna-docusate  1 tablet Oral BID   sevelamer  carbonate  800 mg Oral TID WC   Continuous Infusions:  albumin  human     albumin  human, fentaNYL , mouth rinse, oxyCODONE , polyethylene glycol  Jai-Gurmukh Yakub Lodes, MD  [facility]

## 2024-08-31 NOTE — Progress Notes (Signed)
 " [facility] Progress Note   Subjective:   Seen on HD - 3.5L UFG and tolerating. No CP/dyspnea.  Objective Vitals:   08/31/24 0315 08/31/24 0404 08/31/24 0735 08/31/24 0745  BP: 116/75  124/77 (!) 133/58  Pulse: 92  (!) 108 97  Resp: 19  (!) 23 (!) 23  Temp: 98.6 F (37 C)  99.4 F (37.4 C)   TempSrc: Oral     SpO2: 96%  96% 96%  Weight:  115.6 kg 114.9 kg   Height:       Physical Exam General: Chronically ill appearing, NAD. Room air Heart: RRR; no murmur Lungs: CTA anteriorly Abdomen: soft Extremities: B BKA, trace edema, L 4th finger with ischemic changes Dialysis Access: LUE AVF +t/b  Additional Objective Labs: Basic Metabolic Panel: Recent Labs  Lab 08/28/24 0550 08/29/24 0857 08/30/24 0621 08/31/24 0554  NA 131* 133* 135 135  K 5.7* 4.7 4.1 4.2  CL 91* 91* 92* 94*  CO2 18* 18* 23 24  GLUCOSE 190* 148* 154* 173*  BUN 57* 47* 39* 52*  CREATININE 6.89* 5.75* 4.95* 6.26*  CALCIUM  8.9 9.1 9.4 9.3  PHOS 8.0* 6.2* 5.4*  --    Liver Function Tests: Recent Labs  Lab 08/25/24 2032  AST 38  ALT 19  ALKPHOS 135*  BILITOT 0.5  PROT 6.4*  ALBUMIN  3.7   CBC: Recent Labs  Lab 08/25/24 1415 08/25/24 1421 08/27/24 0927 08/28/24 0550 08/29/24 0857 08/30/24 0417 08/31/24 0554  WBC 6.3   < > 13.4* 10.7* 10.7* 8.6 6.2  NEUTROABS 3.8  --   --   --   --   --  4.1  HGB 11.5*   < > 12.1* 12.2* 12.2* 12.2* 11.8*  HCT 38.0*   < > 38.2* 38.9* 38.0* 39.0 37.5*  MCV 89.8   < > 87.6 87.6 87.2 90.9 86.8  PLT 116*   < > 144* 138* 161 163 171   < > = values in this interval not displayed.   Medications:  albumin  human     anticoagulant sodium citrate       apixaban   5 mg Oral BID   carvedilol   6.25 mg Oral BID WC   Chlorhexidine  Gluconate Cloth  6 each Topical Daily   Chlorhexidine  Gluconate Cloth  6 each Topical Q0600   doxercalciferol   2 mcg Intravenous Q M,W,F-HD   feeding supplement  237 mL Oral BID BM   gabapentin   100 mg Oral TID   insulin   aspart  0-6 Units Subcutaneous Q4H   multivitamin  1 tablet Oral QHS   mouth rinse  15 mL Mouth Rinse 4 times per day   pantoprazole  (PROTONIX ) IV  40 mg Intravenous QHS   polyethylene glycol  17 g Oral Daily   senna-docusate  1 tablet Oral BID   sevelamer  carbonate  800 mg Oral TID WC   Dialysis Orders TTS - [facility] 4:15hr, 450/800, EDW 108kg, 2KI/2.5Ca bath, AVF, UFP #2, no heparin  - no ESA - Hectoral 2mcg IV q HD  Assessment/Plan: ?Dyspnea/undifferentiated shock: Unresponsiveness and required intubation on admission, ?meds. Improved ESRD: Usual TTS schedule - on HD now per holiday schedule, 3.5L UFG. HTN/volume: BP ok, will be closer to dry weight today. Way up on fluid initially - improving.  Anemia of ESRD: Hgb ok, no ESA needed at this time.  Secondary HPTH: Ca/Phos ok - continue home meds. A-fib/RVR: On Eliquis  T2DM L 4th finger ischemia   [name], PA-C 08/31/2024, 7:49 AM  [facility]    "

## 2024-08-31 NOTE — Progress Notes (Signed)
 Speech Language Pathology Treatment: Dysphagia  Patient Details Name: KENDEL BESSEY MRN: [id number] DOB: 1955-12-29 Today's Date: 08/31/2024 Time: 8689-8664 SLP Time Calculation (min) (ACUTE ONLY): 25 min  Assessment / Plan / Recommendation Clinical Impression  Recommend progress diet to Dysphagia 2(chopped)/thin liquids with FULL A with meals d/t pt inability to feed self without A currently.  Medications may be attempted whole in puree as pt mentation has improved, but if needed can continue to crush in puree. Pt with improved mentation with pt oriented to place, situation and self.  Pt is HOH, so comprehension compromised at baseline without A.  Pt able to follow simple directions with po consumption for following oral directives during session.  Pt asking appropriate questions regarding family/situation and following swallowing precautions with min verbal cues for bite size/sips.  Discussed progression of diet to Dysphagia 2(chopped)/thin liquids with pt agreeable, but reported dentures are unavailable.  Pt asked wife to locate and bring to him via telephone when SLP present.  ST will continue to f/u for diet progression and dysphagia tx/management during acute stay.     HPI HPI: Pt is a 68 year old male who resides at [facility], who presented to the ED after being found unresponsive during dialysis. In the ED, he remained unresponsive with labored breathing and pinpoint pupils. Admitted for respiratory failure due to possible opiate overdose and intubated. ETT 12/16- 12/19. CT head/MRI both negative. BSE (02/14/24) noted lethargy as primary swallow safety concern and he was started on a full liquid diet. Diet advanced to regular on 02/17/24. PMH: CAD s/p stents on Plavix , NSTEMI s/p in-stent restenosis to RCA; severe stenosis in Lcx s/p DES 11/24/23, ESRD on HD TTS, PAD s/p bilateral BKS, COPD, DM2, HTN, Afib on Eliquis . BSE completed with recs for Dysphagia 1/thin d/t AMS.  ST f/u for dysphagia  tx.      SLP Plan  Continue with current plan of care        Swallow Evaluation Recommendations   Recommendations: PO diet PO Diet Recommendation: Dysphagia 2 (Finely chopped);Thin liquids (Level 0) Liquid Administration via: Cup;Straw Medication Administration: Crushed with puree (or may try whole in puree for tolerance) Supervision: Staff to assist with self-feeding Postural changes: Position pt fully upright for meals Oral care recommendations: Oral care BID (2x/day);Staff/trained caregiver to provide oral care     Recommendations   PO diet (Dysphagia 2(chopped)/thin liquids                  Oral care BID;Staff/trained caregiver to provide oral care     Dysphagia, unspecified (R13.10)     Continue with current plan of care     Pat Minna Dumire,M.S.,CCC-SLP  08/31/2024, 1:51 PM

## 2024-08-31 NOTE — Consult Note (Signed)
 WOC Nurse Consult Note: Reason for Consult: finger wounds Extensive history of ESRD, PAD, CAD; severe aortic calcification and DM Wound type: Chronic non healing wound middle finger; full thickness; yellow slough; pale Dry gangrene to the right 4th finger; black Skin tears vs bullous areas on the right hand anterior surface; partial thickness skin loss Stab like wound right anterior hand; unclear eitology Pressure Injury POA: NA Measurement: see nursing flow sheet Wound bed: see above  Drainage (amount, consistency, odor) see nursing flow sheets Periwound: intact  Dressing procedure/placement/frequency: Right middle finger; cleanse with Vashe Soila # [phone]), pat dry. Apply xeroform and wrap with 1 conform Right 4th finger, pain with betadine and allow to air dry.     Re consult if needed, will not follow at this time. Thanks  Deysi Soldo [employer], RN,CWOCN, CNS, [employer] [phone]

## 2024-09-01 DIAGNOSIS — R4182 Altered mental status, unspecified: Secondary | ICD-10-CM | POA: Diagnosis not present

## 2024-09-01 LAB — GLUCOSE, CAPILLARY
Glucose-Capillary: 163 mg/dL — ABNORMAL HIGH (ref 70–99)
Glucose-Capillary: 215 mg/dL — ABNORMAL HIGH (ref 70–99)
Glucose-Capillary: 179 mg/dL — ABNORMAL HIGH (ref 70–99)
Glucose-Capillary: 251 mg/dL — ABNORMAL HIGH (ref 70–99)
Glucose-Capillary: 160 mg/dL — ABNORMAL HIGH (ref 70–99)

## 2024-09-01 LAB — MAGNESIUM: Magnesium: 2.2 mg/dL (ref 1.7–2.4)

## 2024-09-01 LAB — CBC WITH DIFFERENTIAL/PLATELET
Abs Immature Granulocytes: 0.02 K/uL (ref 0.00–0.07)
Basophils Absolute: 0.1 K/uL (ref 0.0–0.1)
Basophils Relative: 1 %
Eosinophils Absolute: 0.3 K/uL (ref 0.0–0.5)
Eosinophils Relative: 6 %
HCT: 39.3 % (ref 39.0–52.0)
Hemoglobin: 12.4 g/dL — ABNORMAL LOW (ref 13.0–17.0)
Immature Granulocytes: 0 %
Lymphocytes Relative: 20 %
Lymphs Abs: 1.1 K/uL (ref 0.7–4.0)
MCH: 27.9 pg (ref 26.0–34.0)
MCHC: 31.6 g/dL (ref 30.0–36.0)
MCV: 88.5 fL (ref 80.0–100.0)
Monocytes Absolute: 0.8 K/uL (ref 0.1–1.0)
Monocytes Relative: 14 %
Neutro Abs: 3.4 K/uL (ref 1.7–7.7)
Neutrophils Relative %: 59 %
Platelets: 165 K/uL (ref 150–400)
RBC: 4.44 MIL/uL (ref 4.22–5.81)
RDW: 17.5 % — ABNORMAL HIGH (ref 11.5–15.5)
WBC: 5.7 K/uL (ref 4.0–10.5)
nRBC: 0 % (ref 0.0–0.2)

## 2024-09-01 LAB — BASIC METABOLIC PANEL WITH GFR
Anion gap: 16 — ABNORMAL HIGH (ref 5–15)
BUN: 41 mg/dL — ABNORMAL HIGH (ref 8–23)
CO2: 26 mmol/L (ref 22–32)
Calcium: 9.3 mg/dL (ref 8.9–10.3)
Chloride: 93 mmol/L — ABNORMAL LOW (ref 98–111)
Creatinine, Ser: 5.04 mg/dL — ABNORMAL HIGH (ref 0.61–1.24)
GFR, Estimated: 12 mL/min — ABNORMAL LOW
Glucose, Bld: 157 mg/dL — ABNORMAL HIGH (ref 70–99)
Potassium: 4 mmol/L (ref 3.5–5.1)
Sodium: 135 mmol/L (ref 135–145)

## 2024-09-01 MED ORDER — INSULIN ASPART 100 UNIT/ML IJ SOLN
0.0000 [IU] | Freq: Three times a day (TID) | INTRAMUSCULAR | Status: DC
  Administered 2024-09-01: 1 [IU] via SUBCUTANEOUS
  Administered 2024-09-01: 2 [IU] via SUBCUTANEOUS
  Administered 2024-09-01: 1 [IU] via SUBCUTANEOUS
  Administered 2024-09-01: 2 [IU] via SUBCUTANEOUS
  Administered 2024-09-02: 3 [IU] via SUBCUTANEOUS
  Filled 2024-09-01: qty 1
  Filled 2024-09-01: qty 2
  Filled 2024-09-01: qty 6
  Filled 2024-09-01: qty 1
  Filled 2024-09-01: qty 2

## 2024-09-01 MED ORDER — FENTANYL 12 MCG/HR TD PT72
1.0000 | MEDICATED_PATCH | TRANSDERMAL | Status: DC
  Administered 2024-09-01: 1 via TRANSDERMAL
  Filled 2024-09-01: qty 1

## 2024-09-01 NOTE — Progress Notes (Signed)
 " [facility] Progress Note   Subjective:   Seen in room. No CP/dyspnea. S/p HD yesterday with 3.5L off and tolerated.  Objective Vitals:   08/31/24 2022 09/01/24 0021 09/01/24 0558 09/01/24 0740  BP: (!) 115/45 (!) 97/45 (!) 112/45 133/78  Pulse: 90 94 81 85  Resp: 20 20 19 18   Temp: 98.3 F (36.8 C) 98.3 F (36.8 C) 98.1 F (36.7 C) 98.3 F (36.8 C)  TempSrc: Oral Oral Oral Oral  SpO2: 93% 93% 92% 94%  Weight:   111.5 kg   Height:       Physical Exam General: Chronically ill appearing, NAD. Room air Heart: RRR; no murmur Lungs: CTA anteriorly Abdomen: soft Extremities: B BKA, trace edema, L 4th finger with ischemic changes Dialysis Access: LUE AVF +t/b    Additional Objective Labs: Basic Metabolic Panel: Recent Labs  Lab 08/28/24 0550 08/29/24 0857 08/30/24 0621 08/31/24 0554 09/01/24 0509  NA 131* 133* 135 135 135  K 5.7* 4.7 4.1 4.2 4.0  CL 91* 91* 92* 94* 93*  CO2 18* 18* 23 24 26   GLUCOSE 190* 148* 154* 173* 157*  BUN 57* 47* 39* 52* 41*  CREATININE 6.89* 5.75* 4.95* 6.26* 5.04*  CALCIUM  8.9 9.1 9.4 9.3 9.3  PHOS 8.0* 6.2* 5.4*  --   --    Liver Function Tests: Recent Labs  Lab 08/25/24 2032  AST 38  ALT 19  ALKPHOS 135*  BILITOT 0.5  PROT 6.4*  ALBUMIN  3.7   CBC: Recent Labs  Lab 08/25/24 1415 08/25/24 1421 08/28/24 0550 08/29/24 0857 08/30/24 0417 08/31/24 0554 09/01/24 0509  WBC 6.3   < > 10.7* 10.7* 8.6 6.2 5.7  NEUTROABS 3.8  --   --   --   --  4.1 3.4  HGB 11.5*   < > 12.2* 12.2* 12.2* 11.8* 12.4*  HCT 38.0*   < > 38.9* 38.0* 39.0 37.5* 39.3  MCV 89.8   < > 87.6 87.2 90.9 86.8 88.5  PLT 116*   < > 138* 161 163 171 165   < > = values in this interval not displayed.   Studies/Results: DG Hand Complete Right Result Date: 08/31/2024 CLINICAL DATA:  Osteomyelitis. EXAM: RIGHT HAND - COMPLETE 3+ VIEW COMPARISON:  None Available. FINDINGS: No acute fracture or dislocation. The bones are osteopenic. There is  arthritic changes of the interphalangeal joints. Diffuse vascular calcifications. Soft tissue swelling of the 2nd-4th digits. No opaque foreign object or subcu gas. IMPRESSION: 1. No acute fracture or dislocation. 2. Osteopenia with arthritic changes of the interphalangeal joints. 3. Soft tissue swelling of the 2nd-4th digits. Electronically Signed   By: Vanetta Chou M.D.   On: 08/31/2024 17:36   DG Hand Complete Left Result Date: 08/31/2024 CLINICAL DATA:  Open wound. EXAM: LEFT HAND - COMPLETE 3+ VIEW COMPARISON:  None Available. FINDINGS: There is no acute fracture or dislocation. The bones are osteopenic. Diffuse vascular calcification. Small radiopaque foreign object within soft tissues of the dorsum of the ulnar aspect of the wrist. No soft tissue gas. IMPRESSION: 1. No acute fracture or dislocation. 2. Small radiopaque foreign object within the soft tissues of the dorsum of the wrist. Electronically Signed   By: Vanetta Chou M.D.   On: 08/31/2024 13:46   Medications:   apixaban   5 mg Oral BID   carvedilol   6.25 mg Oral BID WC   Chlorhexidine  Gluconate Cloth  6 each Topical Daily   Chlorhexidine  Gluconate Cloth  6 each Topical  Q0600   doxercalciferol   2 mcg Intravenous Q M,W,F-HD   feeding supplement (NEPRO CARB STEADY)  237 mL Oral BID BM   gabapentin   100 mg Oral TID   insulin  aspart  0-6 Units Subcutaneous TID AC & HS   multivitamin  1 tablet Oral QHS   nutrition supplement (JUVEN)  1 packet Oral BID BM   mouth rinse  15 mL Mouth Rinse 4 times per day   pantoprazole  (PROTONIX ) IV  40 mg Intravenous QHS   polyethylene glycol  17 g Oral Daily   senna-docusate  1 tablet Oral BID   sevelamer  carbonate  800 mg Oral TID WC    Dialysis Orders TTS - [facility] 4:15hr, 450/800, EDW 108kg, 2KI/2.5Ca bath, AVF, UFP #2, no heparin  - no ESA - Hectoral 2mcg IV q HD   Assessment/Plan: ?Dyspnea/undifferentiated shock: Unresponsiveness and required intubation on admission, ?meds.  Improved ESRD: Usual TTS schedule - for HD tomorrow per holiday schedule. HTN/volume: BP ok, will be closer to dry weight today. Way up on fluid initially - improving.  Anemia of ESRD: Hgb ok, no ESA needed at this time.  Secondary HPTH: Ca/Phos ok - continue home meds. A-fib/RVR: On Eliquis  T2DM L 4th finger ischemia  ** Holiday Schedule for THIS week** Usual MWF schedule -> Sun (21), Tues (23), Friday (26) Usual TTS schedule -> Mon (22), Wed (24), Sat (27)   Katie Vira Chaplin, PA-C 09/01/2024, 9:35 AM  [facility]    "

## 2024-09-01 NOTE — Evaluation (Signed)
 Occupational Therapy Evaluation Patient Details Name: [name] MRN: [id number] DOB: October 20, 1955 Today's Date: 09/01/2024   History of Present Illness   68 yo M adm 12/16 acute encephalopathy 2/2 overdose and acute hypoxic RF,Pt with afib with RVR in the ED.  12/16 intubated 12/19 extubated 12/22 ortho hand note for left ring finger pressor-induced ischemic necrosis  PMH 07/2023 with AMS, PVD s/p bil BKA, ESRD on HD TTS, IDDM, COPD, OSA not tolerating CPAP, HTN, HLD, chronic multijoint OA on narcotics, CAD, severe diabetic retinopathy, PAF on Eliquis      Clinical Impressions Pt uses electric w/c at baseline for mobility, transfers via hoyer at [facility]. Performs ADLs at bed level, but has assist for LB ADLs and bathing/toileting. Pt currently close to baseline, but limited by L hand (3rd/4th digit wounds/ROM) along with baseling arthritis. Pt currently needs max A for bed mobility. Pt presenting with impairments listed below, will follow acutely to maximize safety/ind with ADL/functional mobility.     If plan is discharge home, recommend the following:   A lot of help with bathing/dressing/bathroom;Two people to help with walking and/or transfers;Assistance with cooking/housework;Direct supervision/assist for medications management;Direct supervision/assist for financial management;Assist for transportation;Help with stairs or ramp for entrance     Functional Status Assessment   Patient has had a recent decline in their functional status and demonstrates the ability to make significant improvements in function in a reasonable and predictable amount of time.     Equipment Recommendations   None recommended by OT     Recommendations for Other Services   PT consult     Precautions/Restrictions   Precautions Precautions: Fall Recall of Precautions/Restrictions: Intact Restrictions Weight Bearing Restrictions Per Provider Order: No     Mobility Bed Mobility Overal bed  mobility: Needs Assistance Bed Mobility: Rolling Rolling: Max assist              Transfers                          Balance                                           ADL either performed or assessed with clinical judgement   ADL Overall ADL's : Needs assistance/impaired Eating/Feeding: Set up   Grooming: Set up   Upper Body Bathing: Minimal assistance   Lower Body Bathing: Maximal assistance   Upper Body Dressing : Minimal assistance   Lower Body Dressing: Maximal assistance   Toilet Transfer: Total assistance   Toileting- Clothing Manipulation and Hygiene: Maximal assistance       Functional mobility during ADLs: Total assistance       Vision   Vision Assessment?: No apparent visual deficits     Perception Perception: Not tested       Praxis Praxis: Not tested       Pertinent Vitals/Pain Pain Assessment Pain Assessment: Faces Pain Score: 4  Faces Pain Scale: Hurts little more Pain Location: L ring finger with PROM Pain Descriptors / Indicators: Discomfort Pain Intervention(s): Limited activity within patient's tolerance, Monitored during session, Repositioned     Extremity/Trunk Assessment Upper Extremity Assessment Upper Extremity Assessment: Right hand dominant LUE Deficits / Details: wound/necrotic area to L PIP and R ring finger necrotic at distal tip, sensation WFL, decr  extension in DIP on ring finger > middle finger. also with  baseline arthritic changes LUE Coordination: decreased gross motor;decreased fine motor   Lower Extremity Assessment Lower Extremity Assessment: Defer to PT evaluation   Cervical / Trunk Assessment Cervical / Trunk Assessment: Kyphotic   Communication Communication Communication: No apparent difficulties   Cognition Arousal: Alert Behavior During Therapy: WFL for tasks assessed/performed Cognition: No apparent impairments                               Following  commands: Intact       Cueing  General Comments   Cueing Techniques: Verbal cues  VSS   Exercises     Shoulder Instructions      Home Living Family/patient expects to be discharged to:: Skilled nursing facility                                 Additional Comments: resides [facility]      Prior Functioning/Environment Prior Level of Function : Needs assist             Mobility Comments: hoyer lift to w/c, can navigate w/c once in it ADLs Comments: wears depends, bathing and dressing at bed level, reports staff only assists with LB ADLs, able to complete grooming tasks on his own    OT Problem List: Decreased strength;Decreased range of motion;Decreased activity tolerance;Impaired balance (sitting and/or standing);Decreased cognition;Decreased safety awareness;Cardiopulmonary status limiting activity;Impaired UE functional use   OT Treatment/Interventions: Self-care/ADL training;Therapeutic exercise;Energy conservation;DME and/or AE instruction;Therapeutic activities;Patient/family education;Balance training      OT Goals(Current goals can be found in the care plan section)   Acute Rehab OT Goals Patient Stated Goal: none stated OT Goal Formulation: With patient Time For Goal Achievement: 09/15/24 Potential to Achieve Goals: Good ADL Goals Pt Will Perform Grooming: with supervision;bed level Pt Will Perform Upper Body Dressing: with supervision;bed level;sitting Pt/caregiver will Perform Home Exercise Program: Increased ROM;Increased strength;Left upper extremity;With Supervision;With written HEP provided   OT Frequency:  Min 1X/week    Co-evaluation   Reason for Co-Treatment: For patient/therapist safety PT goals addressed during session: Mobility/safety with mobility;Strengthening/ROM        AM-PAC OT 6 Clicks Daily Activity     Outcome Measure Help from another person eating meals?: A Little Help from another person taking care of  personal grooming?: A Little Help from another person toileting, which includes using toliet, bedpan, or urinal?: A Lot Help from another person bathing (including washing, rinsing, drying)?: A Lot Help from another person to put on and taking off regular upper body clothing?: A Little Help from another person to put on and taking off regular lower body clothing?: A Lot 6 Click Score: 15   End of Session Nurse Communication: Mobility status  Activity Tolerance: Patient tolerated treatment well Patient left: in bed;with call bell/phone within reach;with bed alarm set  OT Visit Diagnosis: Unsteadiness on feet (R26.81);Other abnormalities of gait and mobility (R26.89);Muscle weakness (generalized) (M62.81)                Time: 9160-9140 OT Time Calculation (min): 20 min Charges:  OT General Charges $OT Visit: 1 Visit OT Evaluation $OT Eval Moderate Complexity: 1 Mod  Sorren Vallier K, OTD, OTR/L SecureChat Preferred Acute Rehab [phone]   Laneta POUR Koonce 09/01/2024, 12:20 PM

## 2024-09-01 NOTE — Plan of Care (Signed)

## 2024-09-01 NOTE — Evaluation (Signed)
 Physical Therapy Evaluation Patient Details Name: [name] MRN: [id number] DOB: [birth year] Today's Date: 09/01/2024  History of Present Illness  68 yo M adm 12/16 acute encephalopathy 2/2 overdose and acute hypoxic RF,Pt with afib with RVR in the ED.  12/16 intubated 12/19 extubated 12/22 ortho hand note for left ring finger pressor-induced ischemic necrosis  PMH 07/2023 with AMS, PVD s/p bil BKA, ESRD on HD TTS, IDDM, COPD, OSA not tolerating CPAP, HTN, HLD, chronic multijoint OA on narcotics, CAD, severe diabetic retinopathy, PAF on Eliquis   Clinical Impression  Pt presents to eval from SNF and was receiving assistance for all mobility tasks; hoyer lift for all OOB mobility. Pt presents to evaluation with deficits in mobility, strength, and activity tolerance. Pt was able to roll for bed pad placement with maximal physical assistance. Pt would benefit from bed mobility training and UE strengthening. PT will continue to treat pt while he is admitted. Anticipate no follow up physical therapy needed at discharge.         If plan is discharge home, recommend the following: Assist for transportation   Can travel by private vehicle        Equipment Recommendations None recommended by PT  Recommendations for Other Services       Functional Status Assessment Patient has had a recent decline in their functional status and/or demonstrates limited ability to make significant improvements in function in a reasonable and predictable amount of time     Precautions / Restrictions Precautions Precautions: Fall Recall of Precautions/Restrictions: Intact Restrictions Weight Bearing Restrictions Per Provider Order: No      Mobility  Bed Mobility Overal bed mobility: Needs Assistance Bed Mobility: Rolling Rolling: Max assist         General bed mobility comments: Pt able to reach arms across body but requires physical assitance for trunk and BLE management to complete roll for bed pad  placement. VC given for sequencing; increased time to complete.    Transfers                        Ambulation/Gait                  Stairs            Wheelchair Mobility     Tilt Bed    Modified Rankin (Stroke Patients Only)       Balance Overall balance assessment: Mild deficits observed, not formally tested                                           Pertinent Vitals/Pain Pain Assessment Pain Assessment: Faces Faces Pain Scale: Hurts little more Pain Location: L ring finger with PROM Pain Descriptors / Indicators: Discomfort, Grimacing, Guarding Pain Intervention(s): Limited activity within patient's tolerance, Monitored during session    Home Living Family/patient expects to be discharged to:: Skilled nursing facility                   Additional Comments: resides [facility]    Prior Function Prior Level of Function : Needs assist             Mobility Comments: hoyer lift to w/c, can navigate w/c once in it ADLs Comments: wears depends, bathing and dressing at bed level, reports staff only assists with LB ADLs, able to complete grooming tasks  on his own     Extremity/Trunk Assessment   Upper Extremity Assessment Upper Extremity Assessment: Defer to OT evaluation LUE Deficits / Details: wound/necrotic area to L PIP and R ring finger necrotic at distal tip, sensation WFL, decr  extension in DIP on ring finger > middle finger. also with baseline arthritic changes LUE Coordination: decreased gross motor;decreased fine motor    Lower Extremity Assessment Lower Extremity Assessment: Generalized weakness (4/5 hip flx, ABD, ADD and function knee extension bilaterally)    Cervical / Trunk Assessment Cervical / Trunk Assessment: Kyphotic  Communication   Communication Communication: No apparent difficulties    Cognition Arousal: Alert Behavior During Therapy: WFL for tasks assessed/performed   PT -  Cognitive impairments: Problem solving                         Following commands: Intact       Cueing Cueing Techniques: Verbal cues     General Comments General comments (skin integrity, edema, etc.): VSS    Exercises     Assessment/Plan    PT Assessment Patient needs continued PT services  PT Problem List Decreased strength;Decreased range of motion;Decreased activity tolerance;Decreased balance;Decreased mobility;Decreased coordination;Decreased cognition;Pain       PT Treatment Interventions Functional mobility training;Therapeutic activities;Therapeutic exercise;Balance training;Cognitive remediation;Patient/family education;Wheelchair mobility training;Manual techniques    PT Goals (Current goals can be found in the Care Plan section)  Acute Rehab PT Goals Patient Stated Goal: to get stronger PT Goal Formulation: With patient Time For Goal Achievement: 09/15/24 Potential to Achieve Goals: Fair    Frequency Min 1X/week     Co-evaluation PT/OT/SLP Co-Evaluation/Treatment: Yes Reason for Co-Treatment: For patient/therapist safety PT goals addressed during session: Mobility/safety with mobility;Strengthening/ROM         AM-PAC PT 6 Clicks Mobility  Outcome Measure Help needed turning from your back to your side while in a flat bed without using bedrails?: A Lot Help needed moving from lying on your back to sitting on the side of a flat bed without using bedrails?: Total Help needed moving to and from a bed to a chair (including a wheelchair)?: Total Help needed standing up from a chair using your arms (e.g., wheelchair or bedside chair)?: Total Help needed to walk in hospital room?: Total Help needed climbing 3-5 steps with a railing? : Total 6 Click Score: 7    End of Session   Activity Tolerance: Patient tolerated treatment well Patient left: in bed;with call bell/phone within reach;with bed alarm set Nurse Communication: Mobility status PT  Visit Diagnosis: Muscle weakness (generalized) (M62.81);Pain Pain - Right/Left: Left Pain - part of body: Hand    Time: 9160-9140 PT Time Calculation (min) (ACUTE ONLY): 20 min   Charges:   PT Evaluation $PT Eval Moderate Complexity: 1 Mod   PT General Charges $$ ACUTE PT VISIT: 1 Visit         Leontine Hilt DPT Acute Rehab Services [phone] Prefer contact via chat   Shahla Betsill B Andrika Peraza 09/01/2024, 10:30 AM

## 2024-09-01 NOTE — Progress Notes (Signed)
 TRH   ROUNDING   NOTE [name] FMW:[id number]  DOB: 19-May-1956  DOA: 08/25/2024  PCP: [facility], PA-C  09/01/2024,1:51 PM  LOS: 7 days    Code Status: DNR     from: [name]   68 year old male ESRD TTS left AV graft placed 07/21/2020 CAD PCI RCA stent 2016-NSTEMI 11/24/2023 rein-stent stenosis Rx LCx DES-unable to expand stents completely due to severe calcification in RCA Previous large pericardial effusion with tamponade unclear etiology status post cardiocentesis June 2025 admission + symptomatic bradycardia in setting of unclear etiology Bilateral BKA with chronic pain Concern for dysphagia 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 Last hospitalized 12/2-12/3 with volume overload hyperkalemia and required an additional HD session despite having been admitted from [name]--was [facility] back to [name]   Pertinent imaging/studies till date  12/16 [facility] from [name] in the setting of unresponsiveness during HD-1 hour into HD found to be unresponsive with labored breathing pinpoint pupils-etiology = Fentanyl  patch X2 on patient-A-fib RVR started on Cardizem -2/2 persisting encephalopathy multiple rounds of Narcan  intubated admitted to ICU-nephrology consulted on arrival 12/18 cardiology consulted-notes indicate marked bradycardia discontinuation of amiodarone  02/28/2024 2/2 tachybradycardia syndrome patient was NOT felt to be a PPM candidate 12/18 neurology consulted for opinion regarding continued anticoagulation secondary to MRI showing hemosiderin  12/21 transferred to [facility] 12/22 hand surgery consulted regarding ischemic finger in addition to ulcer-x-rays do not show osteomyelitis   Assessment  & Plan :    Shock physiology undifferentiated probably hypovolemic Undifferentiated shock ? 2/2 hypotension in setting of overmedication with fentanyl -/ Requip  2 at bedtime /Percocet  held ===>This is improved overall  Acute  metabolic encephalopathy secondary to iatrogenic opiate overdose superimposed on ESRD Seems resolved overall  ESRD TTS defer to renal with regards to iron studies--is on Venofer  typically TTS  Tachybradycardia syndrome CAD stents 2016 NSTEMI 2025 Pericardial effusion status post pericardial centesis HFpEF-echo this admission EF 55-60% Severe aortic valve calcification with moderate to severe aortic valve stenosis gradient 23 mmHg Rapid A-fib chronic difficulty with Rate control-unfortunately was hypotensive on amiodarone  Started 12/21 Coreg  6.25 twice daily--- is not a pacemaker candidate-- neurology consulted earlier-feels benefit anticoagulation >risk hemorrhage to brain so continues apixaban  5 twice daily Severe AoS will need to be discussed as OP with Cardiology--sees [name] as an outpatient  diabetes mellitus A1c 6.9 Well-controlled diabetes-was on FlexPen as an outpatient, is on very sensitive coverage-CBG here is 163-215---no adjustments today   sacral decubiti  not visualized by me today  Bilateral BKA Lumbago Having some back pain so started back on low-dose of Oxy IR 5 every 6 as needed and patient is mentation has maintained Would use only low-dose at discharge  Digital ischemia Per WOC--Right middle finger; cleanse with Carolynn Soila # [phone]), pat dry. Apply xeroform and wrap with 1 conform---Right 4th finger, pain with betadine and allow to air dry.   d/w [name] who does not recommend any other interventions at this time--wound care as above  Data Reviewed today: Sodium 135 potassium 4.0 BUN/creatinine 41/5.0 anion gap 16--WBC 5.7 hemoglobin 12.4 platelet 165   DVT prophylaxis:  apixaban   Dispo/Global plan: Seems to be improving-likely can return to skilled facility tomorrow  Time.;-30   Subjective:   Looks well feels fair no distress  Objective + exam Vitals:   09/01/24 0021 09/01/24 0558 09/01/24 0740 09/01/24 1133  BP: (!)  97/45 (!) 112/45 133/78 (!) 105/46  Pulse: 94 81 85 83  Resp: 20 19 18  (!) 23  Temp: 98.3 F (36.8 C) 98.1 F (36.7 C) 98.3 F (36.8 C) 98.3 F (36.8 C)  TempSrc: Oral Oral Oral Oral  SpO2: 93% 92% 94%   Weight:  111.5 kg    Height:       Filed Weights   08/31/24 0735 08/31/24 1123 09/01/24 0558  Weight: 114.9 kg 111.9 kg 111.5 kg   Examination: Awake alert coherent no distress neck soft supple CTAB no added sound wheeze rales rhonchi ABD soft NTND Fingers examined personally but overall look about the same as prior Trace lower extremity edema    Scheduled Meds:  apixaban   5 mg Oral BID   carvedilol   6.25 mg Oral BID WC   Chlorhexidine  Gluconate Cloth  6 each Topical Daily   Chlorhexidine  Gluconate Cloth  6 each Topical Q0600   doxercalciferol   2 mcg Intravenous Q M,W,F-HD   feeding supplement (NEPRO CARB STEADY)  237 mL Oral BID BM   gabapentin   100 mg Oral TID   insulin  aspart  0-6 Units Subcutaneous TID AC & HS   multivitamin  1 tablet Oral QHS   nutrition supplement (JUVEN)  1 packet Oral BID BM   mouth rinse  15 mL Mouth Rinse 4 times per day   pantoprazole  (PROTONIX ) IV  40 mg Intravenous QHS   polyethylene glycol  17 g Oral Daily   senna-docusate  1 tablet Oral BID   sevelamer  carbonate  800 mg Oral TID WC   Continuous Infusions:   mouth rinse, oxyCODONE , polyethylene glycol  Jai-Gurmukh Lilee Aldea, MD  [name]

## 2024-09-02 ENCOUNTER — Other Ambulatory Visit (HOSPITAL_COMMUNITY): Payer: Self-pay

## 2024-09-02 DIAGNOSIS — R4182 Altered mental status, unspecified: Secondary | ICD-10-CM | POA: Diagnosis not present

## 2024-09-02 LAB — CBC WITH DIFFERENTIAL/PLATELET
Abs Immature Granulocytes: 0.03 K/uL (ref 0.00–0.07)
Basophils Absolute: 0.1 K/uL (ref 0.0–0.1)
Basophils Relative: 1 %
Eosinophils Absolute: 0.4 K/uL (ref 0.0–0.5)
Eosinophils Relative: 6 %
HCT: 39 % (ref 39.0–52.0)
Hemoglobin: 12.5 g/dL — ABNORMAL LOW (ref 13.0–17.0)
Immature Granulocytes: 1 %
Lymphocytes Relative: 21 %
Lymphs Abs: 1.4 K/uL (ref 0.7–4.0)
MCH: 27.8 pg (ref 26.0–34.0)
MCHC: 32.1 g/dL (ref 30.0–36.0)
MCV: 86.9 fL (ref 80.0–100.0)
Monocytes Absolute: 0.7 K/uL (ref 0.1–1.0)
Monocytes Relative: 11 %
Neutro Abs: 3.9 K/uL (ref 1.7–7.7)
Neutrophils Relative %: 60 %
Platelets: 192 K/uL (ref 150–400)
RBC: 4.49 MIL/uL (ref 4.22–5.81)
RDW: 17.7 % — ABNORMAL HIGH (ref 11.5–15.5)
WBC: 6.5 K/uL (ref 4.0–10.5)
nRBC: 0 % (ref 0.0–0.2)

## 2024-09-02 LAB — BASIC METABOLIC PANEL WITH GFR
Anion gap: 17 — ABNORMAL HIGH (ref 5–15)
BUN: 60 mg/dL — ABNORMAL HIGH (ref 8–23)
CO2: 24 mmol/L (ref 22–32)
Calcium: 9.1 mg/dL (ref 8.9–10.3)
Chloride: 91 mmol/L — ABNORMAL LOW (ref 98–111)
Creatinine, Ser: 6.15 mg/dL — ABNORMAL HIGH (ref 0.61–1.24)
GFR, Estimated: 9 mL/min — ABNORMAL LOW
Glucose, Bld: 153 mg/dL — ABNORMAL HIGH (ref 70–99)
Potassium: 4.5 mmol/L (ref 3.5–5.1)
Sodium: 132 mmol/L — ABNORMAL LOW (ref 135–145)

## 2024-09-02 LAB — GLUCOSE, CAPILLARY
Glucose-Capillary: 118 mg/dL — ABNORMAL HIGH (ref 70–99)
Glucose-Capillary: 210 mg/dL — ABNORMAL HIGH (ref 70–99)

## 2024-09-02 MED ORDER — CARVEDILOL 6.25 MG PO TABS: 6.2500 mg | ORAL_TABLET | Freq: Two times a day (BID) | ORAL | Status: DC

## 2024-09-02 MED ORDER — ALBUMIN HUMAN 25 % IV SOLN
INTRAVENOUS | Status: AC
  Filled 2024-09-02: qty 100

## 2024-09-02 MED ORDER — POLYETHYLENE GLYCOL 3350 17 G PO PACK: 17.0000 g | PACK | Freq: Every day | ORAL | Status: AC | PRN

## 2024-09-02 MED ORDER — ALBUMIN HUMAN 25 % IV SOLN
25.0000 g | Freq: Once | INTRAVENOUS | Status: AC
  Administered 2024-09-02: 25 g via INTRAVENOUS

## 2024-09-02 MED ORDER — SEVELAMER CARBONATE 800 MG PO TABS: 800.0000 mg | ORAL_TABLET | Freq: Three times a day (TID) | ORAL | Status: AC

## 2024-09-02 MED ORDER — FENTANYL 12 MCG/HR TD PT72: 1.0000 | MEDICATED_PATCH | TRANSDERMAL | 0 refills | Status: AC

## 2024-09-02 MED ORDER — DOXERCALCIFEROL 4 MCG/2ML IV SOLN
INTRAVENOUS | Status: AC
  Filled 2024-09-02: qty 2

## 2024-09-02 MED ORDER — OXYCODONE HCL 5 MG PO TABS: 5.0000 mg | ORAL_TABLET | Freq: Four times a day (QID) | ORAL | 0 refills | Status: AC | PRN

## 2024-09-02 MED ORDER — OXYCODONE HCL 5 MG PO TABS
ORAL_TABLET | ORAL | Status: AC
  Filled 2024-09-02: qty 1

## 2024-09-02 NOTE — TOC Transition Note (Signed)
 Transition of Care [facility]) - Discharge Note   Patient Details  Name: [name] MRN: [id number] Date of Birth: 1956/08/11  [facility]) CM/SW Contact:  [name], LCSW Phone Number: 09/02/2024, 12:50 PM   Clinical Narrative:     Per MD patient ready for DC to [facility]. RN, patient, patient's family, and facility notified of DC. Discharge Summary and FL2 sent to facility. RN to call report prior to discharge [phone]). DC packet on chart. Ambulance transport requested for patient.   CSW will sign off for now as social work intervention is no longer needed. Please consult us  again if new needs arise.   Final next level of care: Skilled Nursing Facility Barriers to Discharge: No Barriers Identified     Discharge Placement              Patient chooses bed at: [facility] Patient to be transferred to facility by: [facility] Name of family member notified: [name],[name]  Spouse, Emergency Contact  [phone] (Mobile) Patient and family notified of of transfer: 09/02/24  Discharge Plan and Services Additional resources added to the After Visit Summary for                                       Social Drivers of Health (SDOH) Interventions SDOH Screenings   Food Insecurity: No Food Insecurity (03/25/2024)  Housing: Low Risk (03/25/2024)  Transportation Needs: No Transportation Needs (03/25/2024)  Utilities: Not At Risk (03/25/2024)  Social Connections: Moderately Isolated (03/25/2024)  Tobacco Use: High Risk (08/31/2024)     Readmission Risk Interventions    08/12/2024    1:17 PM 05/29/2023    4:41 PM  Readmission Risk Prevention Plan  Transportation Screening Complete Complete  Medication Review Oceanographer) Complete Complete  PCP or Specialist appointment within 3-5 days of discharge  Complete  HRI or Home Care Consult Complete Complete  SW Recovery Care/Counseling Consult  Complete  Palliative Care Screening Not Applicable Not  Applicable  Skilled Nursing Facility Not Applicable Complete

## 2024-09-02 NOTE — Discharge Summary (Signed)
 Physician Discharge Summary  [name] FMW:[id number] DOB: 12/10/1955 DOA: 08/25/2024  PCP: Montey Lot, PA-C  Admit date: 08/25/2024 Discharge date: 09/02/2024  Time spent: 42 minutes  Recommendations for Outpatient Follow-up:  Requires outpatient management with Dr. Bretta hand surgery for right hand index and long finger wounds-please see wound care order instructions below Multiple meds with risk for metabolic encephalopathy-discontinue-please be very careful putting back the fentanyl  patch on him Please manage patient's pain cautiously Needs renal panel CBC iron levels within the next 1 week-iron can be studied within the next 3 weeks Note multiple dosage changes of several antihypertensives this hospital stay-reimplement cautiously in the next several weeks   Discharge Diagnoses:  MAIN problem for hospitalization   Distributive shock/hypovolemic shock in the setting of probable metabolic encephalopathy from iatrogenic unintentional overdose of opiates in the setting of renal insufficiency 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 A-fib Chronic sacral decubitus Digital ischemia  Please see below for itemized issues addressed in HOpsital- refer to other progress notes for clarity if needed  Discharge Condition: Improved  Diet recommendation: Renal diabetic restrict fluids to 1200  Filed Weights   08/31/24 1123 09/01/24 0558 09/02/24 0331  Weight: 111.9 kg 111.5 kg 113.9 kg    History of present illness:  68 year old male ESRD TTS left AV graft placed 07/21/2020 CAD PCI RCA stent 2016-NSTEMI 11/24/2023 rein-stent stenosis Rx LCx DES-unable to expand stents completely due to severe calcification in RCA Previous large pericardial effusion with tamponade unclear etiology status post cardiocentesis June 2025 admission + symptomatic bradycardia in setting of unclear etiology Bilateral BKA with chronic pain Concern for dysphagia 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 skilled facility    Pertinent imaging/studies till date  12/16 admit from [facility] in the setting of unresponsiveness during HD-1 hour into HD found to be unresponsive with labored breathing pinpoint pupils-etiology = Fentanyl  patch X2 on patient-A-fib RVR started on Cardizem -2/2 persisting encephalopathy multiple rounds of Narcan  intubated admitted to ICU-nephrology consulted on arrival 12/18 cardiology consulted-notes indicate marked bradycardia discontinuation of amiodarone  02/28/2024 2/2 tachybradycardia syndrome patient was NOT felt to be a PPM candidate 12/18 neurology consulted for opinion regarding continued anticoagulation secondary to MRI showing hemosiderin  12/21 transferred to [facility]ist 12/22 hand surgery consulted regarding ischemic finger in addition to ulcer-x-rays do not show osteomyelitis    Assessment  & Plan :      Shock physiology undifferentiated probably hypovolemic Undifferentiated shock ? 2/2 hypotension in setting of overmedication with fentanyl -/ Requip  2 at bedtime /Percocet  held ===>This is improved overall-he is coherent At the request of his wife I reimplemented low-dose fentanyl  patch 12.5 on 12/23 and so far he is tolerating it well and is not somnolent-very cautious increase in pain meds at discharge please   Acute metabolic encephalopathy secondary to iatrogenic opiate overdose superimposed on ESRD Seems resolved overall   ESRD TTS defer to renal with regards to iron studies--is on Venofer  typically TTS Will require outpatient iron studies in about 3 weeks--- unclear when was last performed as our labs indicate 2024 July   Tachybradycardia syndrome CAD stents 2016 NSTEMI 2025 Pericardial effusion status post pericardial centesis HFpEF-echo this  admission EF 55-60% Severe aortic valve calcification with moderate to severe aortic valve stenosis gradient 23 mmHg Rapid A-fib chronic difficulty with Rate control-unfortunately was hypotensive on amiodarone  Started 12/21 Coreg  6.25  twice daily--- is not a pacemaker candidate-- neurology consulted earlier during hospitalization by cardiologist who were concerned about hemosiderin on the MRI-neurology did feels benefit anticoagulation >risk hemorrhage to brain so continues apixaban  5 twice daily at this time Severe AoS will need to be discussed as OP with Cardiology--sees Dr. Ganji as an outpatient   diabetes mellitus A1c 6.9 Well-controlled diabetes-was on FlexPen as an outpatient, is on very sensitive coverage-home regimen of insulin  with sliding scale as per nursing home resumed    sacral decubiti  not visualized by me-has been stable   Bilateral BKA Lumbago Cautious implementation Oxy IR discontinued Percocet  this hospital stay-see above discussion regarding fentanyl  low-dose being prescribed now do not overmedicate\   Digital ischemia Per WOC--Right middle finger; cleanse with Vashe Soila # [phone]), pat dry. Apply xeroform and wrap with 1 conform---Right 4th finger, pain with betadine and allow to air dry.   d/w Dr. Lord of Hand Surgery who does not recommend any other interventions at this time--wound care as above   Discharge Exam: Vitals:   09/02/24 0816 09/02/24 0830  BP: 95/83 115/76  Pulse: 81 87  Resp: 20 20  Temp:    SpO2: 91% 93%    Subj on day of d/c   Sitting at HD looks comfortable had breakfast no fever no chills no nausea no vomiting He does not feel weak/confused or have any other symptoms and looks well  General Exam on discharge  EOMI NCAT no focal deficit no icterus no pallor thick neck S1-S2 in A-fib rate controlled Chest is clear no wheeze no rales no rhonchi LUE graft Abdomen obese nontender Bilateral BKA present with dressings to both  lower extremities  Discharge Instructions   Discharge Instructions     Discharge wound care:   Complete by: As directed    Wound care  Daily      Comments: Right middle finger; cleanse with Vashe Soila # [phone]), pat dry. Apply xeroform and wrap with 1 conform Right 4th finger, pain with betadine and allow to air dry.   Increase activity slowly   Complete by: As directed       Allergies as of 09/02/2024       Reactions   Contrast Media [iodinated Contrast Media] Shortness Of Breath, Other (See Comments)   Altered mental status   Ivp Dye [iodinated Contrast Media] Anaphylaxis, Shortness Of Breath, Other (See Comments)   Altered mental status    Latex Rash   severe rash appears after the first 24 hours of being placed   Tape Rash, Other (See Comments)   Rash after 1 day of use Do not leave on longer then 3 days without changing        Medication List     STOP taking these medications    amLODipine  5 MG tablet Commonly known as: NORVASC    fentaNYL  50 MCG/HR Commonly known as: DURAGESIC  Replaced by: fentaNYL  12 MCG/HR   [name]'s butt cream Crea   ipratropium-albuterol  0.5-2.5 (3) MG/3ML Soln Commonly known as: DUONEB   isosorbide  mononitrate 60 MG 24 hr tablet Commonly known as: IMDUR    Lokelma  10 g Pack packet Generic drug: sodium zirconium cyclosilicate    mineral oil-hydrophilic petrolatum ointment   nitroGLYCERIN  0.4 MG SL tablet Commonly known as: NITROSTAT    oxyCODONE -acetaminophen  5-325 MG tablet Commonly known as: PERCOCET /ROXICET   VENOFER  IV       TAKE these medications    rOPINIRole  2 MG tablet Commonly known as: REQUIP  Take 2 mg by mouth  at bedtime. The timing of this medication is very important.   apixaban  5 MG Tabs tablet Commonly known as: ELIQUIS  Take 1 tablet (5 mg total) by mouth 2 (two) times daily.   atorvastatin  80 MG tablet Commonly known as: LIPITOR  Take 1 tablet (80 mg total) by mouth at bedtime.   carvedilol   6.25 MG tablet Commonly known as: COREG  Take 1 tablet (6.25 mg total) by mouth 2 (two) times daily with a meal.   colchicine  0.6 MG tablet Take 0.5 tablets (0.3 mg total) by mouth daily. What changed: when to take this   Dialyvite /Zinc  Tabs Take 1 tablet by mouth See admin instructions. Give 1 tablet by mouth 3 days a week prior to dialysis on Tuesday, Thursday, Saturday.   famotidine  20 MG tablet Commonly known as: PEPCID  Take 20 mg by mouth See admin instructions. Give 1 tablet (20mg ) by mouth every other morning.   fentaNYL  12 MCG/HR Commonly known as: DURAGESIC  Place 1 patch onto the skin every 3 (three) days. Start taking on: September 04, 2024 Replaces: fentaNYL  50 MCG/HR   ferrous sulfate  325 (65 FE) MG tablet Take 325 mg by mouth in the morning and at bedtime.   folic acid  1 MG tablet Commonly known as: FOLVITE  Take 1 mg by mouth daily.   gabapentin  100 MG capsule Commonly known as: NEURONTIN  Take 100 mg by mouth 3 (three) times daily.   HECTOROL  IV Inject 2 mcg into the vein Every Tuesday,Thursday,and Saturday with dialysis. Medication is provided and administered by [facility]; Hectorol  IV 2mcg x3 week on Tuesday, Thursday and Saturday - during dialysis treatment.   NovoLOG  FlexPen 100 UNIT/ML FlexPen Generic drug: insulin  aspart Inject 4-16 Units into the skin See admin instructions. Inject 4 units subcutaneously three times daily before meals, in ADDITION to 0-12 units per sliding scale: 70 - 150 : 0 units 151-200 : 2 units 201-250 : 4 units 251-300 : 6 units 301-350 : 8 units 351-400 : 10 units 401-450 : 12 units > 400 : give 12 units and recheck in 1 hour - if still > 400, call MD/NP for further orders.   oxyCODONE  5 MG immediate release tablet Commonly known as: Oxy IR/ROXICODONE  Take 1 tablet (5 mg total) by mouth every 6 (six) hours as needed for moderate pain (pain score 4-6) or severe pain (pain score 7-10).   polyethylene glycol 17  g packet Commonly known as: MIRALAX  / GLYCOLAX  Take 17 g by mouth daily as needed for moderate constipation.   Preparation H 0.25 % Supp Generic drug: Phenylephrine  HCl Place 1 suppository rectally every 6 (six) hours as needed (hemorrhoids).   senna 8.6 MG Tabs tablet Commonly known as: SENOKOT Take 17.2 mg by mouth at bedtime.   sevelamer  carbonate 800 MG tablet Commonly known as: RENVELA  Take 1 tablet (800 mg total) by mouth 3 (three) times daily with meals. What changed: how much to take   tamsulosin  0.4 MG Caps capsule Commonly known as: FLOMAX  Take 0.4 mg by mouth daily.               Discharge Care Instructions  (From admission, onward)           Start     Ordered   09/02/24 0000  Discharge wound care:       Comments: Wound care  Daily      Comments: Right middle finger; cleanse with Vashe Soila # [phone]), pat dry. Apply xeroform and wrap with 1 conform Right 4th  finger, pain with betadine and allow to air dry.   09/02/24 0848           Allergies[1]    The results of significant diagnostics from this hospitalization (including imaging, microbiology, ancillary and laboratory) are listed below for reference.    Significant Diagnostic Studies: DG Hand Complete Right Result Date: 08/31/2024 CLINICAL DATA:  Osteomyelitis. EXAM: RIGHT HAND - COMPLETE 3+ VIEW COMPARISON:  None Available. FINDINGS: No acute fracture or dislocation. The bones are osteopenic. There is arthritic changes of the interphalangeal joints. Diffuse vascular calcifications. Soft tissue swelling of the 2nd-4th digits. No opaque foreign object or subcu gas. IMPRESSION: 1. No acute fracture or dislocation. 2. Osteopenia with arthritic changes of the interphalangeal joints. 3. Soft tissue swelling of the 2nd-4th digits. Electronically Signed   By: Vanetta Chou M.D.   On: 08/31/2024 17:36   DG Hand Complete Left Result Date: 08/31/2024 CLINICAL DATA:  Open wound. EXAM: LEFT HAND -  COMPLETE 3+ VIEW COMPARISON:  None Available. FINDINGS: There is no acute fracture or dislocation. The bones are osteopenic. Diffuse vascular calcification. Small radiopaque foreign object within soft tissues of the dorsum of the ulnar aspect of the wrist. No soft tissue gas. IMPRESSION: 1. No acute fracture or dislocation. 2. Small radiopaque foreign object within the soft tissues of the dorsum of the wrist. Electronically Signed   By: Vanetta Chou M.D.   On: 08/31/2024 13:46   MR BRAIN WO CONTRAST Result Date: 08/27/2024 EXAM: MRI BRAIN WITHOUT CONTRAST 08/27/2024 96:91:75 AM TECHNIQUE: Multiplanar multisequence MRI of the head/brain was performed without the administration of intravenous contrast. COMPARISON: MRA Head Without 04/14/2012 and CT of the head dated 08/25/2024. CLINICAL HISTORY: Delirium. FINDINGS: BRAIN AND VENTRICLES: No acute infarct. No intracranial hemorrhage. No mass. No midline shift. No hydrocephalus. Chronic encephalomalacia changes are again noted within the right parietal lobe and to a lesser extent the right frontal lobe. There is moderately advanced cerebral white matter disease present bilaterally. There are curvilinear areas of hemosiderin staining present within the left parietal and occipital lobes. There are also a couple of foci of hemosiderin staining within the left frontal and right parietal lobes. The sella is unremarkable. Normal flow voids. ORBITS: No acute abnormality. SINUSES AND MASTOIDS: No acute abnormality. BONES AND SOFT TISSUES: Normal marrow signal. No acute soft tissue abnormality. IMPRESSION: 1. No acute intracranial abnormality. 2. Chronic encephalomalacia changes in the right parietal lobe and to a lesser extent the right frontal lobe. 3. Moderately advanced cerebral white matter disease bilaterally. 4. Curvilinear areas of hemosiderin staining in the left parietal and occipital lobes, as well as a couple of foci in the left frontal and right parietal  lobes. Electronically signed by: Evalene Coho MD 08/27/2024 07:43 AM EST RP Workstation: [id number]   ECHOCARDIOGRAM COMPLETE Result Date: 08/26/2024    ECHOCARDIOGRAM LIMITED REPORT   Patient Name:   [name] Date of Exam: 08/26/2024 Medical Rec #:  [id number]     Height:       74.0 in Accession #:    [id number]    Weight:       254.0 lb Date of Birth:  Mar 14, 1956     BSA:          2.406 m Patient Age:    68 years      BP:           118/64 mmHg Patient Gender: M             HR:  78 bpm. Exam Location:  Inpatient Procedure: 2D Echo and Intracardiac Opacification Agent (Both Spectral and Color            Flow Doppler were utilized during procedure). STAT ECHO Indications:    Abn ECG  History:        Patient has prior history of Echocardiogram examinations, most                 recent 02/17/2024. CHF, CAD; Risk Factors:Dyslipidemia and                 Diabetes.  Sonographer:    Thea Norlander Referring Phys: [id number] JAMIE H DAGENHART IMPRESSIONS  1. Left ventricular ejection fraction, by estimation, is 50 to 55%. The left ventricle has low normal function. The left ventricular internal cavity size was mildly dilated. Left ventricular diastolic parameters are indeterminate.  2. Right ventricular systolic function is moderately reduced. The right ventricular size is moderately enlarged. There is mildly elevated pulmonary artery systolic pressure. The estimated right ventricular systolic pressure is 37.3 mmHg.  3. Left atrial size was moderately dilated.  4. Right atrial size was moderately dilated.  5. The mitral valve is degenerative. Mild mitral valve regurgitation.  6. Dimensionless index 0.26. The aortic valve is tricuspid. There is severe calcifcation of the aortic valve. Moderate to severe aortic valve stenosis. Aortic valve area, by VTI measures 1.22 cm. Aortic valve mean gradient measures 23.8 mmHg. Aortic valve Vmax measures 3.16 m/s.  7. Aortic dilatation noted. There is borderline  dilatation of the ascending aorta, measuring 38 mm.  8. The inferior vena cava is dilated in size with <50% respiratory variability, suggesting right atrial pressure of 15 mmHg. FINDINGS  Left Ventricle: Left ventricular ejection fraction, by estimation, is 50 to 55%. The left ventricle has low normal function. Definity  contrast agent was given IV to delineate the left ventricular endocardial borders. The left ventricular internal cavity  size was mildly dilated. Left ventricular diastolic parameters are indeterminate. Right Ventricle: The right ventricular size is moderately enlarged. Right ventricular systolic function is moderately reduced. There is mildly elevated pulmonary artery systolic pressure. The tricuspid regurgitant velocity is 2.36 m/s, and with an assumed right atrial pressure of 15 mmHg, the estimated right ventricular systolic pressure is 37.3 mmHg. Left Atrium: Left atrial size was moderately dilated. Right Atrium: Right atrial size was moderately dilated. Mitral Valve: The mitral valve is degenerative in appearance. Mild to moderate mitral annular calcification. Mild mitral valve regurgitation. MV peak gradient, 9.6 mmHg. The mean mitral valve gradient is 4.0 mmHg. Tricuspid Valve: The tricuspid valve is normal in structure. Tricuspid valve regurgitation is mild. Aortic Valve: Dimensionless index 0.26. The aortic valve is tricuspid. There is severe calcifcation of the aortic valve. Moderate to severe aortic stenosis is present. Aortic valve mean gradient measures 23.8 mmHg. Aortic valve peak gradient measures 40.1 mmHg. Aortic valve area, by VTI measures 1.22 cm. Pulmonic Valve: The pulmonic valve was grossly normal. Pulmonic valve regurgitation is mild. Aorta: Aortic dilatation noted. There is borderline dilatation of the ascending aorta, measuring 38 mm. Venous: The inferior vena cava is dilated in size with less than 50% respiratory variability, suggesting right atrial pressure of 15 mmHg.  LEFT VENTRICLE PLAX 2D LVIDd:         5.80 cm      Diastology LVIDs:         5.05 cm      LV e' medial:    4.95 cm/s LV PW:  0.75 cm      LV E/e' medial:  29.7 LV IVS:        1.00 cm      LV e' lateral:   8.76 cm/s LVOT diam:     2.50 cm      LV E/e' lateral: 16.8 LV SV:         80 LV SV Index:   33 LVOT Area:     4.91 cm  LV Volumes (MOD) LV vol d, MOD A2C: 196.0 ml LV vol d, MOD A4C: 126.0 ml LV vol s, MOD A2C: 135.0 ml LV vol s, MOD A4C: 95.4 ml LV SV MOD A2C:     61.0 ml LV SV MOD A4C:     126.0 ml LV SV MOD BP:      47.0 ml RIGHT VENTRICLE            IVC RV S prime:     5.27 cm/s  IVC diam: 2.80 cm LEFT ATRIUM         Index LA diam:    5.25 cm 2.18 cm/m  AORTIC VALVE                     PULMONIC VALVE AV Area (Vmax):    1.17 cm      PV Vmax:       0.95 m/s AV Area (Vmean):   1.08 cm      PV Peak grad:  3.6 mmHg AV Area (VTI):     1.22 cm AV Vmax:           316.50 cm/s AV Vmean:          227.750 cm/s AV VTI:            0.651 m AV Peak Grad:      40.1 mmHg AV Mean Grad:      23.8 mmHg LVOT Vmax:         75.30 cm/s LVOT Vmean:        50.200 cm/s LVOT VTI:          0.162 m LVOT/AV VTI ratio: 0.25  AORTA Ao Root diam: 3.40 cm Ao Asc diam:  3.80 cm MITRAL VALVE                TRICUSPID VALVE MV Area (PHT): 3.72 cm     TR Peak grad:   22.3 mmHg MV Area VTI:   2.71 cm     TR Vmax:        236.00 cm/s MV Peak grad:  9.6 mmHg MV Mean grad:  4.0 mmHg     SHUNTS MV Vmax:       1.55 m/s     Systemic VTI:  0.16 m MV Vmean:      93.4 cm/s    Systemic Diam: 2.50 cm MV Decel Time: 204 msec MR Peak grad: 101.2 mmHg MR Vmax:      503.00 cm/s MV E velocity: 147.00 cm/s Toribio Fuel MD Electronically signed by Toribio Fuel MD Signature Date/Time: 08/26/2024/1:05:13 PM    Final    CT Head Wo Contrast Result Date: 08/25/2024 EXAM: CT HEAD WITHOUT CONTRAST 08/25/2024 08:42:00 PM TECHNIQUE: CT of the head was performed without the administration of intravenous contrast. Automated exposure control, iterative  reconstruction, and/or weight based adjustment of the mA/kV was utilized to reduce the radiation dose to as low as reasonably achievable. COMPARISON: 05/28/2023 CLINICAL HISTORY: Mental status change, unknown cause FINDINGS: BRAIN AND VENTRICLES: No acute  hemorrhage. No evidence of acute infarct. 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 Mild periventricular white matter hypodensity, likely chronic ischemic changes. No hydrocephalus. No extra-axial collection. No mass effect or midline shift. Atherosclerotic calcifications within the cavernous internal carotid arteries. ORBITS: No acute abnormality. Bilateral lens replacement noted. SINUSES: Bilateral ethmoid mucosal thickening. Mastoid air cells and middle ear cavities are clear. SOFT TISSUES AND SKULL: No acute soft tissue abnormality. No skull fracture. IMPRESSION: 1. No acute intracranial abnormality. 2. 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 3. Mild periventricular white matter hypodensity, likely chronic ischemic change. Electronically signed by: Dorethia Molt MD 08/25/2024 08:48 PM EST RP Workstation: [id number]   DG Abd Portable 1 View Result Date: 08/25/2024 EXAM: 1 VIEW XRAY OF THE ABDOMEN 08/25/2024 07:01:00 PM COMPARISON: Comparison with 02/14/2024. CLINICAL HISTORY: og tube placement og tube placement FINDINGS: LIMITATIONS/ARTIFACTS: Limited field of view for tube placement verification purposes. LINES, TUBES AND DEVICES: An enteric tube is present with the tip projecting over the left upper quadrant consistent with location in the body of the stomach. BOWEL: Visualized bowel gas pattern is normal. SOFT TISSUES: Surgical clips in the right upper quadrant. Vascular calcification in the left upper quadrant. BONES: Postoperative changes and degenerative changes in the spine. No acute fracture. IMPRESSION: 1. Enteric tube tip  projects over the left upper quadrant, consistent with location in the body of the stomach. Electronically signed by: Elsie Gravely MD 08/25/2024 07:10 PM EST RP Workstation: [id number]   DG Chest Portable 1 View Result Date: 08/25/2024 CLINICAL DATA:  Intubation. EXAM: PORTABLE CHEST 1 VIEW COMPARISON:  Chest radiograph dated 08/25/2024. FINDINGS: Endotracheal tube with tip approximately 5.5 cm above the carina. Enteric tube extends below the diaphragm with tip beyond the margin of the image. Cardiomegaly with vascular congestion. Diffuse interstitial densities may be chronic or represent edema. Patchy left lung base opacity may represent atelectasis or infiltrate. No large pleural effusion. No pneumothorax. No acute osseous pathology. IMPRESSION: 1. Endotracheal tube above the carina. 2. Cardiomegaly with vascular congestion and possible mild edema. 3. Left lung base atelectasis or infiltrate. Electronically Signed   By: Vanetta Chou M.D.   On: 08/25/2024 19:07   DG Chest Port 1 View Result Date: 08/25/2024 CLINICAL DATA:  Shortness of breath. EXAM: PORTABLE CHEST 1 VIEW COMPARISON:  Chest radiograph dated 08/11/2024. FINDINGS: Cardiomegaly with vascular congestion and mild edema. No focal consolidation, pleural effusion, pneumothorax. Atherosclerotic calcification of the aorta. No acute osseous pathology. IMPRESSION: Cardiomegaly with vascular congestion and mild edema. Electronically Signed   By: Vanetta Chou M.D.   On: 08/25/2024 14:55   DG Chest Port 1 View Result Date: 08/11/2024 CLINICAL DATA:  Shortness of breath. EXAM: PORTABLE CHEST 1 VIEW COMPARISON:  03/18/2024 FINDINGS: Stable cardiac enlargement. Very low lung volumes bilaterally. Bibasilar opacities likely represent atelectasis. Difficult to exclude component of pneumonia at either lung base. Difficult to exclude mild underlying pulmonary interstitial edema. No significant pleural fluid or evidence of pneumothorax. The visualized  skeletal structures are unremarkable. IMPRESSION: Very low lung volumes with bibasilar opacities likely representing atelectasis. Difficult to exclude component of pneumonia at either lung base. Difficult to exclude mild underlying pulmonary interstitial edema. Electronically Signed   By: Marcey Moan M.D.   On: 08/11/2024 18:29    Microbiology: Recent Results (from the past 240 hours)  Blood culture (routine x 2)     Status: None   Collection Time:  08/25/24  8:32 PM   Specimen: BLOOD  Result Value Ref Range Status   Specimen Description BLOOD SITE NOT SPECIFIED  Final   Special Requests   Final    BOTTLES DRAWN AEROBIC AND ANAEROBIC Blood Culture adequate volume   Culture   Final    NO GROWTH 5 DAYS Performed at [facility], [address] [place], [place] [address]    Report Status 08/30/2024 FINAL  Final  MRSA Next Gen by PCR, Nasal     Status: Abnormal   Collection Time: 08/25/24  9:19 PM   Specimen: Nasal Mucosa; Nasal Swab  Result Value Ref Range Status   MRSA by PCR Next Gen DETECTED (A) NOT DETECTED Final    Comment: RESULT CALLED TO, READ BACK BY AND VERIFIED WITH: D REABIS RN 08/26/2024 @ 0419 BY AB (NOTE) The GeneXpert MRSA Assay (FDA approved for NASAL specimens only), is one component of a comprehensive MRSA colonization surveillance program. It is not intended to diagnose MRSA infection nor to guide or monitor treatment for MRSA infections. Test performance is not FDA approved in patients less than 46 years old. Performed at [facility], [address] [place], [place] [address]   Blood culture (routine x 2)     Status: None   Collection Time: 08/26/24 10:17 AM   Specimen: BLOOD LEFT ARM  Result Value Ref Range Status   Specimen Description BLOOD LEFT ARM  Final   Special Requests   Final    AEROBIC BOTTLE ONLY Blood Culture results may not be optimal due to an inadequate volume of blood received in culture bottles   Culture   Final    NO  GROWTH 5 DAYS Performed at [facility], [address] [place], [place] [address]    Report Status 08/31/2024 FINAL  Final  Resp panel by RT-PCR (RSV, Flu A&B, Covid) Anterior Nasal Swab     Status: None   Collection Time: 08/26/24  1:03 PM   Specimen: Anterior Nasal Swab  Result Value Ref Range Status   SARS Coronavirus 2 by RT PCR NEGATIVE NEGATIVE Final   Influenza A by PCR NEGATIVE NEGATIVE Final   Influenza B by PCR NEGATIVE NEGATIVE Final    Comment: (NOTE) The Xpert Xpress SARS-CoV-2/FLU/RSV plus assay is intended as an aid in the diagnosis of influenza from Nasopharyngeal swab specimens and should not be used as a sole basis for treatment. Nasal washings and aspirates are unacceptable for Xpert Xpress SARS-CoV-2/FLU/RSV testing.  Fact Sheet for Patients: bloggercourse.com  Fact Sheet for Healthcare Providers: seriousbroker.it  This test is not yet approved or cleared by the [place]  FDA and has been authorized for detection and/or diagnosis of SARS-CoV-2 by FDA under an Emergency Use Authorization (EUA). This EUA will remain in effect (meaning this test can be used) for the duration of the COVID-19 declaration under Section 564(b)(1) of the Act, 21 U.S.C. section 360bbb-3(b)(1), unless the authorization is terminated or revoked.     Resp Syncytial Virus by PCR NEGATIVE NEGATIVE Final    Comment: (NOTE) Fact Sheet for Patients: bloggercourse.com  Fact Sheet for Healthcare Providers: seriousbroker.it  This test is not yet approved or cleared by the [place]  FDA and has been authorized for detection and/or diagnosis of SARS-CoV-2 by FDA under an Emergency Use Authorization (EUA). This EUA will remain in effect (meaning this test can be used) for the duration of the COVID-19 declaration under Section 564(b)(1) of the Act, 21 U.S.C.  section  360bbb-3(b)(1), unless the authorization is terminated or revoked.  Performed at [facility], [address], [place], [place] [address]      Labs: Basic Metabolic Panel: Recent Labs  Lab 08/26/24 1301 08/27/24 9072 08/28/24 0550 08/29/24 0857 08/30/24 0621 08/31/24 0554 09/01/24 0509 09/02/24 0442  NA 134* 131* 131* 133* 135 135 135 132*  K 5.6* 5.5* 5.7* 4.7 4.1 4.2 4.0 4.5  CL 94* 91* 91* 91* 92* 94* 93* 91*  CO2 20* 20* 18* 18* 23 24 26 24   GLUCOSE 132* 150* 190* 148* 154* 173* 157* 153*  BUN 66* 44* 57* 47* 39* 52* 41* 60*  CREATININE 7.96* 6.07* 6.89* 5.75* 4.95* 6.26* 5.04* 6.15*  CALCIUM  8.0* 8.6* 8.9 9.1 9.4 9.3 9.3 9.1  MG 1.8 1.9 1.9 1.9 2.0  --  2.2  --   PHOS 7.8* 6.7* 8.0* 6.2* 5.4*  --   --   --    Liver Function Tests: No results for input(s): AST, ALT, ALKPHOS, BILITOT, PROT, ALBUMIN  in the last 168 hours. No results for input(s): LIPASE, AMYLASE in the last 168 hours. No results for input(s): AMMONIA in the last 168 hours. CBC: Recent Labs  Lab 08/29/24 0857 08/30/24 0417 08/31/24 0554 09/01/24 0509 09/02/24 0442  WBC 10.7* 8.6 6.2 5.7 6.5  NEUTROABS  --   --  4.1 3.4 3.9  HGB 12.2* 12.2* 11.8* 12.4* 12.5*  HCT 38.0* 39.0 37.5* 39.3 39.0  MCV 87.2 90.9 86.8 88.5 86.9  PLT 161 163 171 165 192   Cardiac Enzymes: No results for input(s): CKTOTAL, CKMB, CKMBINDEX, TROPONINI in the last 168 hours. BNP: BNP (last 3 results) Recent Labs    02/12/24 1638  BNP 387.9*    ProBNP (last 3 results) Recent Labs    08/25/24 1415  PROBNP >35,000.0*    CBG: Recent Labs  Lab 09/01/24 1130 09/01/24 1716 09/01/24 2022 09/01/24 2325 09/02/24 0322  GLUCAP 215* 179* 251* 160* 118*    Signed:  Colen Grimes MD   [facility] 09/02/2024, 8:48 AM      [1]  Allergies Allergen Reactions   Contrast Media [Iodinated Contrast Media] Shortness Of Breath and Other (See Comments)    Altered mental  status     Ivp Dye [Iodinated Contrast Media] Anaphylaxis, Shortness Of Breath and Other (See Comments)    Altered mental status     Latex Rash    severe rash appears after the first 24 hours of being placed   Tape Rash and Other (See Comments)    Rash after 1 day of use  Do not leave on longer then 3 days without changing

## 2024-09-02 NOTE — Progress Notes (Signed)
 Patient transferred to chair for HD at this time. CCMD notified.

## 2024-09-02 NOTE — Progress Notes (Signed)
 " [facility] Progress Note   Subjective:   Seen at start of HD - in recliner and comfortable, UFG 3.5L. No CP/dyspnea at the moment.  Objective Vitals:   09/01/24 2331 09/02/24 0331 09/02/24 0649 09/02/24 0802  BP: (!) 100/50 100/62 (!) 98/56   Pulse: 80 75    Resp: 20 17 20    Temp: 99 F (37.2 C) 98.6 F (37 C)  98.2 F (36.8 C)  TempSrc: Axillary Axillary  Oral  SpO2:  94%    Weight:  113.9 kg    Height:       Physical Exam General: Better appearing, NAD. Room air Heart: RRR; no murmur Lungs: CTA anteriorly Abdomen: soft Extremities: B BKA, trace edema, L 4th finger with ischemic changes Dialysis Access: LUE AVF +t/b   Additional Objective Labs: Basic Metabolic Panel: Recent Labs  Lab 08/28/24 0550 08/29/24 0857 08/30/24 0621 08/31/24 0554 09/01/24 0509 09/02/24 0442  NA 131* 133* 135 135 135 132*  K 5.7* 4.7 4.1 4.2 4.0 4.5  CL 91* 91* 92* 94* 93* 91*  CO2 18* 18* 23 24 26 24   GLUCOSE 190* 148* 154* 173* 157* 153*  BUN 57* 47* 39* 52* 41* 60*  CREATININE 6.89* 5.75* 4.95* 6.26* 5.04* 6.15*  CALCIUM  8.9 9.1 9.4 9.3 9.3 9.1  PHOS 8.0* 6.2* 5.4*  --   --   --    CBC: Recent Labs  Lab 08/29/24 0857 08/30/24 0417 08/31/24 0554 09/01/24 0509 09/02/24 0442  WBC 10.7* 8.6 6.2 5.7 6.5  NEUTROABS  --   --  4.1 3.4 3.9  HGB 12.2* 12.2* 11.8* 12.4* 12.5*  HCT 38.0* 39.0 37.5* 39.3 39.0  MCV 87.2 90.9 86.8 88.5 86.9  PLT 161 163 171 165 192   Studies/Results: DG Hand Complete Right Result Date: 08/31/2024 CLINICAL DATA:  Osteomyelitis. EXAM: RIGHT HAND - COMPLETE 3+ VIEW COMPARISON:  None Available. FINDINGS: No acute fracture or dislocation. The bones are osteopenic. There is arthritic changes of the interphalangeal joints. Diffuse vascular calcifications. Soft tissue swelling of the 2nd-4th digits. No opaque foreign object or subcu gas. IMPRESSION: 1. No acute fracture or dislocation. 2. Osteopenia with arthritic changes of the  interphalangeal joints. 3. Soft tissue swelling of the 2nd-4th digits. Electronically Signed   By: Vanetta Chou M.D.   On: 08/31/2024 17:36   DG Hand Complete Left Result Date: 08/31/2024 CLINICAL DATA:  Open wound. EXAM: LEFT HAND - COMPLETE 3+ VIEW COMPARISON:  None Available. FINDINGS: There is no acute fracture or dislocation. The bones are osteopenic. Diffuse vascular calcification. Small radiopaque foreign object within soft tissues of the dorsum of the ulnar aspect of the wrist. No soft tissue gas. IMPRESSION: 1. No acute fracture or dislocation. 2. Small radiopaque foreign object within the soft tissues of the dorsum of the wrist. Electronically Signed   By: Vanetta Chou M.D.   On: 08/31/2024 13:46   Medications:   apixaban   5 mg Oral BID   carvedilol   6.25 mg Oral BID WC   Chlorhexidine  Gluconate Cloth  6 each Topical Daily   Chlorhexidine  Gluconate Cloth  6 each Topical Q0600   doxercalciferol   2 mcg Intravenous Q M,W,F-HD   feeding supplement (NEPRO CARB STEADY)  237 mL Oral BID BM   fentaNYL   1 patch Transdermal Q72H   gabapentin   100 mg Oral TID   insulin  aspart  0-6 Units Subcutaneous TID AC & HS   multivitamin  1 tablet Oral QHS   nutrition supplement (JUVEN)  1 packet Oral BID BM   mouth rinse  15 mL Mouth Rinse 4 times per day   pantoprazole  (PROTONIX ) IV  40 mg Intravenous QHS   polyethylene glycol  17 g Oral Daily   senna-docusate  1 tablet Oral BID   sevelamer  carbonate  800 mg Oral TID WC    Dialysis Orders TTS - [facility] 4:15hr, 450/800, EDW 108kg, 2KI/2.5Ca bath, AVF, UFP #2, no heparin  - no ESA - Hectoral 2mcg IV q HD   Assessment/Plan: ?Dyspnea/undifferentiated shock: Unresponsiveness and required intubation on admission, ?meds. Improved ESRD: Usual TTS schedule - HD today per holiday schedule. 3.5L UFG and tolerating. HTN/volume: BP ok, will be closer to dry weight today. Way up on fluid initially - improving.  Anemia of ESRD: Hgb ok, no ESA needed at  this time.  Secondary HPTH: Ca/Phos ok - continue home meds. A-fib/RVR: On Eliquis  T2DM L 4th finger ischemia   ** Holiday Schedule for THIS week** Usual MWF schedule -> Sun (21), Tues (23), Friday (26) Usual TTS schedule -> Mon (22), Wed (24), Sat (27)   Katie Dominique Calvey, PA-C 09/02/2024, 8:07 AM  [facility]    "

## 2024-09-02 NOTE — Progress Notes (Signed)
 D/C order noted. Contacted [facility] to be advised of pt's d/c today and that pt should resume care on Saturday.   Randine Mungo Dialysis Navigator [phone]

## 2024-09-02 NOTE — Progress Notes (Signed)
 Received patient in bed to unit.  Alert and oriented.  Informed consent signed and in chart.   TX duration: 3.25  Patient tolerated well.  Transported back to the room  Alert, without acute distress.  Hand-off given to patient's nurse.   Access used: L. AVF Access issues: N/A  Total UF removed: 3200 Medication(s) given: Albumin  25g, Hectoral 2mcg, Oxycodone  5mg  IR Post HD VS: 98.44F oral, BP 111/61, HR 86 Post HD weight: unable to weight d/t specialty chair; pt unable to stand  09/02/24 1137  Vitals  Temp 98.1 F (36.7 C)  Temp Source Oral  BP 111/61  MAP (mmHg) 74  BP Location Right Arm  BP Method Automatic  Patient Position (if appropriate) Lying  Pulse Rate 80  Pulse Rate Source Monitor  ECG Heart Rate 93  Resp 20  Oxygen  Therapy  SpO2 93 %  O2 Device Room Air  Patient Activity (if Appropriate) In chair  Pulse Oximetry Type Continuous  Oximetry Probe Site Changed No  During Treatment Monitoring  Blood Flow Rate (mL/min) 399 mL/min  Arterial Pressure (mmHg) -204.43 mmHg  Venous Pressure (mmHg) 203.02 mmHg  TMP (mmHg) 12.73 mmHg  Ultrafiltration Rate (mL/min) 1335 mL/min  Dialysate Flow Rate (mL/min) 299 ml/min  Duration of HD Treatment -hour(s) 3.22 hour(s)  Cumulative Fluid Removed (mL) per Treatment  3123.43  HD Safety Checks Performed Yes  Intra-Hemodialysis Comments Tolerated well;Tx completed  Post Treatment  Dialyzer Clearance Clear  Hemodialysis Intake (mL) 240 mL  Liters Processed 77.8  Fluid Removed (mL) 3200 mL  Tolerated HD Treatment Yes  AVG/AVF Arterial Site Held (minutes) 10 minutes  AVG/AVF Venous Site Held (minutes) 10 minutes  Fistula / Graft Left Upper arm Arteriovenous fistula  Placement Date/Time: 07/21/20 0825   Placed prior to admission: No  Orientation: Left  Access Location: Upper arm  Access Type: Arteriovenous fistula  Site Condition No complications  Fistula / Graft Assessment Present;Thrill;Bruit  Status Deaccessed  Drainage  Description None     Carlyon Rhein, RN [facility] D[facility]

## 2024-09-02 NOTE — NC FL2 (Signed)
 " [place]  MEDICAID FL2 LEVEL OF CARE FORM     IDENTIFICATION  Patient Name: [name] Birthdate: [birth year] Sex: male Admission Date (Current Location): 08/25/2024  [facility] and [place] Number:  Producer, Television/film/video and Address:  [facility], [address] [place], [place] [address]      Provider Number: [id number]  Attending Physician Name and Address:  Royal Sill, MD  Relative Name and Phone Number:  Vishal, Sandlin (Spouse) [phone] (Mobile)    Current Level of Care: Hospital Recommended Level of Care: Skilled Nursing Facility Prior Approval Number:    Date Approved/Denied:   PASRR Number: [id number]  Discharge Plan: SNF    Current Diagnoses: Patient Active Problem List   Diagnosis Date Noted   Pressure injury of skin 08/27/2024   AMS (altered mental status) 08/25/2024   Volume overload 08/11/2024   Non compliance with medical treatment 03/28/2024   Decubitus ulcer of sacral region, stage 2 (HCC) 03/25/2024   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/2024   Obesity, Class I, BMI 30-34.9 03/25/2024   Nonrheumatic aortic (valve) stenosis 03/18/2024   History of Clostridioides difficile colitis 02/12/2024   BPH (benign prostatic hyperplasia) 02/12/2024   ESRD (end stage renal disease) on dialysis (HCC) 07/19/2023   History of right coronary artery stent placement 03/31/2023   Current use of long term anticoagulation 03/31/2023   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) 03/31/2023   Paroxysmal atrial fibrillation (HCC) 03/31/2023   Anemia due to chronic kidney disease 01/13/2022   Obesity (BMI 30-39.9) 01/13/2022   Proliferative diabetic retinopathy of left eye with macular edema associated with type 2 diabetes mellitus (HCC) 11/27/2021   Atrial fibrillation with RVR (HCC) 06/14/2021   Carotid stenosis 06/15/2020   Dyslipidemia 06/15/2020   Diabetic macular edema of right eye with  proliferative retinopathy associated with type 2 diabetes mellitus (HCC) 01/18/2020   Severe nonproliferative diabetic retinopathy of left eye, with macular edema, associated with type 2 diabetes mellitus (HCC) 01/04/2020   Retinal hemorrhage of right eye 01/04/2020   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   S/P carotid endarterectomy 09/18/2018   OSA not tolerating CPAP 09/18/2018   Chronic pain 09/18/2018   Peripheral artery disease 11/12/2016   Unilateral primary osteoarthritis, left knee 10/11/2016   Type 2 diabetes mellitus with diabetic neuropathy (HCC) 05/09/2016   CAD (dz of distal, mid and proximal RCA with implantation of 3 overlapping drug-eluting stent, with elevated troponin 09/03/2015   (HFpEF) heart failure with preserved ejection fraction (HCC) 09/03/2015   Hyperlipidemia 08/25/2014   Limb pain 03/20/2013   Restless legs syndrome (RLS) 09/25/2012   COPD (chronic obstructive pulmonary disease) (HCC) 04/25/2012   Hypokalemia 11/01/2011   Occlusion and stenosis of carotid artery without mention of cerebral infarction 06/12/2011   GERD (gastroesophageal reflux disease) 05/08/2011   Barrett's esophagus without dysplasia 05/08/2011   Former tobacco use 02/01/2011    Orientation RESPIRATION BLADDER Height & Weight     Self, Time, Situation, Place  Normal Continent Weight:  (unable to obtain weight d/t specialy chair) Height:  6' 2 (188 cm)  BEHAVIORAL SYMPTOMS/MOOD NEUROLOGICAL BOWEL NUTRITION STATUS      Continent Diet (see d/c summary)  AMBULATORY STATUS COMMUNICATION OF NEEDS Skin   Extensive Assist Verbally PU Stage and Appropriate Care (pressure injury sacrum stage 2; pressure injury left thigh stage 1; wound right hand; trauma to left finger)  Personal Care Assistance Level of Assistance  Bathing, Feeding, Dressing Bathing Assistance: Maximum assistance Feeding assistance: Independent Dressing Assistance: Maximum assistance     Functional  Limitations Info  Sight, Speech, Hearing Sight Info: Adequate Hearing Info: Adequate Speech Info: Adequate    SPECIAL CARE FACTORS FREQUENCY                       Contractures Contractures Info: Not present    Additional Factors Info  Code Status, Allergies Code Status Info: full code Allergies Info: Contrast Media (iodinated Contrast Media); Ivp Dye (iodinated Contrast Media); Latex; Tape           Current Medications (09/02/2024):  This is the current hospital active medication list Current Facility-Administered Medications  Medication Dose Route Frequency Provider Last Rate Last Admin   apixaban  (ELIQUIS ) tablet 5 mg  5 mg Oral BID , Jennifer D, RPH   5 mg at 09/01/24 2104   carvedilol  (COREG ) tablet 6.25 mg  6.25 mg Oral BID WC Samtani, Jai-Gurmukh, MD   6.25 mg at 09/01/24 1803   Chlorhexidine  Gluconate Cloth 2 % PADS 6 each  6 each Topical Daily Dagenhart, Jamie H, NP   6 each at 09/01/24 9060   Chlorhexidine  Gluconate Cloth 2 % PADS 6 each  6 each Topical Q0600 Geralynn Charleston, MD   6 each at 09/02/24 [phone]   doxercalciferol  (HECTOROL ) injection 2 mcg  2 mcg Intravenous Q M,W,F-HD Tanda Powell ORN, RPH   2 mcg at 08/31/24 1118   feeding supplement (NEPRO CARB STEADY) liquid 237 mL  237 mL Oral BID BM Samtani, Jai-Gurmukh, MD   237 mL at 09/01/24 1319   fentaNYL  (DURAGESIC ) 12 MCG/HR 1 patch  1 patch Transdermal Q72H Samtani, Jai-Gurmukh, MD   1 patch at 09/01/24 1828   gabapentin  (NEURONTIN ) capsule 100 mg  100 mg Oral TID Mannam, Praveen, MD   100 mg at 09/01/24 2103   insulin  aspart (novoLOG ) injection 0-6 Units  0-6 Units Subcutaneous TID AC & HS Samtani, Jai-Gurmukh, MD   2 Units at 09/01/24 2114   multivitamin (RENA-VIT) tablet 1 tablet  1 tablet Oral QHS Mannam, Praveen, MD   1 tablet at 09/01/24 2103   nutrition supplement (JUVEN) (JUVEN) powder packet 1 packet  1 packet Oral BID BM Samtani, Jai-Gurmukh, MD   1 packet at 09/01/24 1318   Oral care mouth  rinse  15 mL Mouth Rinse 4 times per day Gatha Pence, MD   15 mL at 09/01/24 2104   Oral care mouth rinse  15 mL Mouth Rinse PRN Gatha Pence, MD       oxyCODONE  (Oxy IR/ROXICODONE ) immediate release tablet 5 mg  5 mg Oral Q6H PRN Samtani, Jai-Gurmukh, MD   5 mg at 09/01/24 2103   pantoprazole  (PROTONIX ) injection 40 mg  40 mg Intravenous QHS Dagenhart, Jamie H, NP   40 mg at 09/01/24 2103   polyethylene glycol (MIRALAX  / GLYCOLAX ) packet 17 g  17 g Oral Daily PRN Mannam, Praveen, MD       polyethylene glycol (MIRALAX  / GLYCOLAX ) packet 17 g  17 g Oral Daily Mannam, Praveen, MD   17 g at 08/29/24 0949   senna-docusate (Senokot-S) tablet 1 tablet  1 tablet Oral BID Mannam, Praveen, MD   1 tablet at 09/01/24 2104   sevelamer  carbonate (RENVELA ) tablet 800 mg  800 mg Oral TID WC Mannam, Praveen, MD   800 mg at 09/01/24 1803     Discharge Medications:  Please see discharge summary for a list of discharge medications.  Relevant Imaging Results:  Relevant Lab Results:   Additional Information ss#[id number].   HD patient [facility] on TTS)  Miki Blank SHAUNNA Cumming, LCSW     "

## 2024-09-02 NOTE — Discharge Planning (Signed)
 [facility] Patient Discharge Orders - [facility] CLINIC: [facility]  Patient's name: DEQUAVION FOLLETTE Admit/DC Dates: 08/25/2024 - 09/02/2024  DISCHARGE DIAGNOSES: Undifferentiated shock - ?unintentional med overdose AMS - improved/resolved L 4th finger ischemia  - monitor for now AHRF/overload - slow improvement A-fib with RVR v. tachybrady syndrome - on BB now, not a pacemaker candidate  HD ORDER CHANGES: Heparin  change: no EDW Change: no  Bath Change: no  ANEMIA MANAGEMENT: Aranesp : Given: no    ESA dose for discharge: Per protocol IV Iron dose at discharge: per protocol Transfusion: Given: no  BONE/MINERAL MEDICATIONS: Hectorol /Calcitriol change: no Sensipar/Parsabiv change: no  ACCESS INTERVENTION/CHANGE: no Details:   RECENT LABS: Recent Labs  Lab 08/30/24 0621 08/31/24 0554 09/02/24 0442  HGB  --    < > 12.5*  NA 135   < > 132*  K 4.1   < > 4.5  CALCIUM  9.4   < > 9.1  PHOS 5.4*  --   --    < > = values in this interval not displayed.    IV ANTIBIOTICS: no Details:  OTHER ANTICOAGULATION: yes Details: On Eliquis   OTHER/APPTS/LAB ORDERS:  D/C Meds to be reconciled by nurse after every discharge.  Completed By: [name], PA-C [facility] Pager [phone]   Reviewed by: MD:______ RN_______

## 2024-09-18 ENCOUNTER — Encounter (HOSPITAL_COMMUNITY): Payer: Self-pay | Admitting: *Deleted

## 2024-09-18 ENCOUNTER — Other Ambulatory Visit: Payer: Self-pay

## 2024-09-18 ENCOUNTER — Emergency Department (HOSPITAL_COMMUNITY)

## 2024-09-18 ENCOUNTER — Inpatient Hospital Stay (HOSPITAL_COMMUNITY)
Admission: EM | Admit: 2024-09-18 | Discharge: 2024-09-25 | DRG: 853 | Disposition: A | Discharge status: Skilled Nursing Facility | Attending: Internal Medicine | Admitting: Internal Medicine

## 2024-09-18 DIAGNOSIS — F17290 Nicotine dependence, other tobacco product, uncomplicated: Secondary | ICD-10-CM | POA: Diagnosis present

## 2024-09-18 DIAGNOSIS — E785 Hyperlipidemia, unspecified: Secondary | ICD-10-CM | POA: Diagnosis present

## 2024-09-18 DIAGNOSIS — Z89512 Acquired absence of left leg below knee: Secondary | ICD-10-CM

## 2024-09-18 DIAGNOSIS — A4159 Other Gram-negative sepsis: Principal | ICD-10-CM | POA: Diagnosis present

## 2024-09-18 DIAGNOSIS — I5082 Biventricular heart failure: Secondary | ICD-10-CM | POA: Diagnosis present

## 2024-09-18 DIAGNOSIS — Z91048 Other nonmedicinal substance allergy status: Secondary | ICD-10-CM

## 2024-09-18 DIAGNOSIS — Z992 Dependence on renal dialysis: Secondary | ICD-10-CM

## 2024-09-18 DIAGNOSIS — G9389 Other specified disorders of brain: Secondary | ICD-10-CM | POA: Diagnosis present

## 2024-09-18 DIAGNOSIS — E782 Mixed hyperlipidemia: Secondary | ICD-10-CM

## 2024-09-18 DIAGNOSIS — A419 Sepsis, unspecified organism: Principal | ICD-10-CM | POA: Diagnosis present

## 2024-09-18 DIAGNOSIS — Z91040 Latex allergy status: Secondary | ICD-10-CM

## 2024-09-18 DIAGNOSIS — R509 Fever, unspecified: Secondary | ICD-10-CM | POA: Diagnosis present

## 2024-09-18 DIAGNOSIS — S68125A Partial traumatic metacarpophalangeal amputation of left ring finger, initial encounter: Secondary | ICD-10-CM | POA: Diagnosis present

## 2024-09-18 DIAGNOSIS — E1152 Type 2 diabetes mellitus with diabetic peripheral angiopathy with gangrene: Secondary | ICD-10-CM | POA: Diagnosis present

## 2024-09-18 DIAGNOSIS — E1122 Type 2 diabetes mellitus with diabetic chronic kidney disease: Secondary | ICD-10-CM | POA: Diagnosis present

## 2024-09-18 DIAGNOSIS — X58XXXA Exposure to other specified factors, initial encounter: Secondary | ICD-10-CM | POA: Diagnosis present

## 2024-09-18 DIAGNOSIS — Z91041 Radiographic dye allergy status: Secondary | ICD-10-CM

## 2024-09-18 DIAGNOSIS — N2581 Secondary hyperparathyroidism of renal origin: Secondary | ICD-10-CM | POA: Diagnosis present

## 2024-09-18 DIAGNOSIS — N189 Chronic kidney disease, unspecified: Secondary | ICD-10-CM | POA: Diagnosis not present

## 2024-09-18 DIAGNOSIS — Z981 Arthrodesis status: Secondary | ICD-10-CM

## 2024-09-18 DIAGNOSIS — Z794 Long term (current) use of insulin: Secondary | ICD-10-CM | POA: Diagnosis not present

## 2024-09-18 DIAGNOSIS — L89891 Pressure ulcer of other site, stage 1: Secondary | ICD-10-CM | POA: Diagnosis present

## 2024-09-18 DIAGNOSIS — I132 Hypertensive heart and chronic kidney disease with heart failure and with stage 5 chronic kidney disease, or end stage renal disease: Secondary | ICD-10-CM | POA: Diagnosis present

## 2024-09-18 DIAGNOSIS — E1142 Type 2 diabetes mellitus with diabetic polyneuropathy: Secondary | ICD-10-CM | POA: Diagnosis present

## 2024-09-18 DIAGNOSIS — Z7901 Long term (current) use of anticoagulants: Secondary | ICD-10-CM

## 2024-09-18 DIAGNOSIS — I5022 Chronic systolic (congestive) heart failure: Secondary | ICD-10-CM | POA: Diagnosis present

## 2024-09-18 DIAGNOSIS — Z8249 Family history of ischemic heart disease and other diseases of the circulatory system: Secondary | ICD-10-CM

## 2024-09-18 DIAGNOSIS — Y95 Nosocomial condition: Secondary | ICD-10-CM | POA: Diagnosis present

## 2024-09-18 DIAGNOSIS — I350 Nonrheumatic aortic (valve) stenosis: Secondary | ICD-10-CM | POA: Diagnosis present

## 2024-09-18 DIAGNOSIS — Z79899 Other long term (current) drug therapy: Secondary | ICD-10-CM

## 2024-09-18 DIAGNOSIS — J189 Pneumonia, unspecified organism: Secondary | ICD-10-CM | POA: Diagnosis present

## 2024-09-18 DIAGNOSIS — I96 Gangrene, not elsewhere classified: Secondary | ICD-10-CM | POA: Diagnosis not present

## 2024-09-18 DIAGNOSIS — E875 Hyperkalemia: Secondary | ICD-10-CM | POA: Diagnosis present

## 2024-09-18 DIAGNOSIS — K219 Gastro-esophageal reflux disease without esophagitis: Secondary | ICD-10-CM | POA: Diagnosis present

## 2024-09-18 DIAGNOSIS — Z833 Family history of diabetes mellitus: Secondary | ICD-10-CM

## 2024-09-18 DIAGNOSIS — Z955 Presence of coronary angioplasty implant and graft: Secondary | ICD-10-CM

## 2024-09-18 DIAGNOSIS — Z79891 Long term (current) use of opiate analgesic: Secondary | ICD-10-CM

## 2024-09-18 DIAGNOSIS — I482 Chronic atrial fibrillation, unspecified: Secondary | ICD-10-CM | POA: Diagnosis present

## 2024-09-18 DIAGNOSIS — F17210 Nicotine dependence, cigarettes, uncomplicated: Secondary | ICD-10-CM | POA: Diagnosis present

## 2024-09-18 DIAGNOSIS — Z720 Tobacco use: Secondary | ICD-10-CM | POA: Diagnosis present

## 2024-09-18 DIAGNOSIS — G894 Chronic pain syndrome: Secondary | ICD-10-CM | POA: Diagnosis present

## 2024-09-18 DIAGNOSIS — N186 End stage renal disease: Secondary | ICD-10-CM | POA: Diagnosis present

## 2024-09-18 DIAGNOSIS — D631 Anemia in chronic kidney disease: Secondary | ICD-10-CM | POA: Diagnosis present

## 2024-09-18 DIAGNOSIS — Z89511 Acquired absence of right leg below knee: Secondary | ICD-10-CM

## 2024-09-18 DIAGNOSIS — Z66 Do not resuscitate: Secondary | ICD-10-CM | POA: Diagnosis present

## 2024-09-18 DIAGNOSIS — E871 Hypo-osmolality and hyponatremia: Secondary | ICD-10-CM | POA: Diagnosis present

## 2024-09-18 DIAGNOSIS — Z83438 Family history of other disorder of lipoprotein metabolism and other lipidemia: Secondary | ICD-10-CM

## 2024-09-18 DIAGNOSIS — Z862 Personal history of diseases of the blood and blood-forming organs and certain disorders involving the immune mechanism: Secondary | ICD-10-CM

## 2024-09-18 DIAGNOSIS — R5381 Other malaise: Secondary | ICD-10-CM | POA: Diagnosis present

## 2024-09-18 DIAGNOSIS — Z8673 Personal history of transient ischemic attack (TIA), and cerebral infarction without residual deficits: Secondary | ICD-10-CM

## 2024-09-18 DIAGNOSIS — E119 Type 2 diabetes mellitus without complications: Secondary | ICD-10-CM

## 2024-09-18 DIAGNOSIS — G9341 Metabolic encephalopathy: Secondary | ICD-10-CM | POA: Diagnosis present

## 2024-09-18 DIAGNOSIS — I5030 Unspecified diastolic (congestive) heart failure: Secondary | ICD-10-CM | POA: Diagnosis not present

## 2024-09-18 DIAGNOSIS — I110 Hypertensive heart disease with heart failure: Secondary | ICD-10-CM | POA: Diagnosis not present

## 2024-09-18 DIAGNOSIS — R6520 Severe sepsis without septic shock: Secondary | ICD-10-CM | POA: Diagnosis present

## 2024-09-18 LAB — COMPREHENSIVE METABOLIC PANEL WITH GFR
ALT: 15 U/L (ref 0–44)
AST: 21 U/L (ref 15–41)
Albumin: 3.8 g/dL (ref 3.5–5.0)
Alkaline Phosphatase: 157 U/L — ABNORMAL HIGH (ref 38–126)
Anion gap: 16 — ABNORMAL HIGH (ref 5–15)
BUN: 69 mg/dL — ABNORMAL HIGH (ref 8–23)
CO2: 24 mmol/L (ref 22–32)
Calcium: 8.7 mg/dL — ABNORMAL LOW (ref 8.9–10.3)
Chloride: 88 mmol/L — ABNORMAL LOW (ref 98–111)
Creatinine, Ser: 7.47 mg/dL — ABNORMAL HIGH (ref 0.61–1.24)
GFR, Estimated: 7 mL/min — ABNORMAL LOW
Glucose, Bld: 148 mg/dL — ABNORMAL HIGH (ref 70–99)
Potassium: 6.3 mmol/L (ref 3.5–5.1)
Sodium: 128 mmol/L — ABNORMAL LOW (ref 135–145)
Total Bilirubin: 0.7 mg/dL (ref 0.0–1.2)
Total Protein: 6.4 g/dL — ABNORMAL LOW (ref 6.5–8.1)

## 2024-09-18 LAB — CBC WITH DIFFERENTIAL/PLATELET
Abs Immature Granulocytes: 0.07 K/uL (ref 0.00–0.07)
Basophils Absolute: 0.1 K/uL (ref 0.0–0.1)
Basophils Relative: 0 %
Eosinophils Absolute: 0.1 K/uL (ref 0.0–0.5)
Eosinophils Relative: 0 %
HCT: 35.7 % — ABNORMAL LOW (ref 39.0–52.0)
Hemoglobin: 11.2 g/dL — ABNORMAL LOW (ref 13.0–17.0)
Immature Granulocytes: 1 %
Lymphocytes Relative: 9 %
Lymphs Abs: 1.1 K/uL (ref 0.7–4.0)
MCH: 28.2 pg (ref 26.0–34.0)
MCHC: 31.4 g/dL (ref 30.0–36.0)
MCV: 89.9 fL (ref 80.0–100.0)
Monocytes Absolute: 0.9 K/uL (ref 0.1–1.0)
Monocytes Relative: 8 %
Neutro Abs: 9.6 K/uL — ABNORMAL HIGH (ref 1.7–7.7)
Neutrophils Relative %: 82 %
Platelets: 139 K/uL — ABNORMAL LOW (ref 150–400)
RBC: 3.97 MIL/uL — ABNORMAL LOW (ref 4.22–5.81)
RDW: 17.9 % — ABNORMAL HIGH (ref 11.5–15.5)
WBC: 11.8 K/uL — ABNORMAL HIGH (ref 4.0–10.5)
nRBC: 0 % (ref 0.0–0.2)

## 2024-09-18 LAB — PROTIME-INR
INR: 2.2 — ABNORMAL HIGH (ref 0.8–1.2)
Prothrombin Time: 25.9 s — ABNORMAL HIGH (ref 11.4–15.2)

## 2024-09-18 LAB — I-STAT CG4 LACTIC ACID, ED: Lactic Acid, Venous: 1.4 mmol/L (ref 0.5–1.9)

## 2024-09-18 LAB — RESP PANEL BY RT-PCR (RSV, FLU A&B, COVID)  RVPGX2
Influenza A by PCR: NEGATIVE
Influenza B by PCR: NEGATIVE
Resp Syncytial Virus by PCR: NEGATIVE
SARS Coronavirus 2 by RT PCR: NEGATIVE

## 2024-09-18 MED ORDER — CHLORHEXIDINE GLUCONATE CLOTH 2 % EX PADS
6.0000 | MEDICATED_PAD | Freq: Every day | CUTANEOUS | Status: DC
  Administered 2024-09-20 – 2024-09-25 (×4): 6 via TOPICAL

## 2024-09-18 MED ORDER — HEPARIN SODIUM (PORCINE) 5000 UNIT/ML IJ SOLN: 5000.0000 [IU] | Freq: Three times a day (TID) | INTRAMUSCULAR | Status: DC

## 2024-09-18 MED ORDER — ACETAMINOPHEN 325 MG PO TABS
650.0000 mg | ORAL_TABLET | Freq: Four times a day (QID) | ORAL | Status: DC | PRN
  Administered 2024-09-19 – 2024-09-20 (×2): 650 mg via ORAL
  Filled 2024-09-18 (×2): qty 2

## 2024-09-18 MED ORDER — ACETAMINOPHEN 650 MG RE SUPP: 650.0000 mg | Freq: Four times a day (QID) | RECTAL | Status: DC | PRN

## 2024-09-18 MED ORDER — SODIUM CHLORIDE 0.9 % IV SOLN
2.0000 g | Freq: Once | INTRAVENOUS | Status: AC
  Administered 2024-09-18: 2 g via INTRAVENOUS
  Filled 2024-09-18: qty 12.5

## 2024-09-18 MED ORDER — LACTATED RINGERS IV SOLN: INTRAVENOUS | Status: DC

## 2024-09-18 MED ORDER — SODIUM ZIRCONIUM CYCLOSILICATE 10 G PO PACK
10.0000 g | PACK | Freq: Every day | ORAL | Status: DC
  Administered 2024-09-18 – 2024-09-24 (×5): 10 g via ORAL
  Filled 2024-09-18 (×5): qty 1

## 2024-09-18 MED ORDER — ONDANSETRON HCL 4 MG/2ML IJ SOLN: 4.0000 mg | Freq: Four times a day (QID) | INTRAMUSCULAR | Status: DC | PRN

## 2024-09-18 MED ORDER — MELATONIN 3 MG PO TABS
3.0000 mg | ORAL_TABLET | Freq: Every evening | ORAL | Status: DC | PRN
  Administered 2024-09-19 – 2024-09-24 (×3): 3 mg via ORAL
  Filled 2024-09-18 (×3): qty 1

## 2024-09-18 MED ORDER — METRONIDAZOLE 500 MG/100ML IV SOLN
500.0000 mg | Freq: Once | INTRAVENOUS | Status: AC
  Administered 2024-09-18: 500 mg via INTRAVENOUS
  Filled 2024-09-18: qty 100

## 2024-09-18 MED ORDER — SODIUM CHLORIDE 0.9 % IV SOLN
1.0000 g | INTRAVENOUS | Status: DC
  Filled 2024-09-18: qty 10

## 2024-09-18 MED ORDER — VANCOMYCIN HCL IN DEXTROSE 1-5 GM/200ML-% IV SOLN
1000.0000 mg | Freq: Once | INTRAVENOUS | Status: DC
  Filled 2024-09-18: qty 200

## 2024-09-18 MED ORDER — APIXABAN 5 MG PO TABS
5.0000 mg | ORAL_TABLET | Freq: Two times a day (BID) | ORAL | Status: DC
  Administered 2024-09-19 – 2024-09-20 (×4): 5 mg via ORAL
  Filled 2024-09-18 (×4): qty 1

## 2024-09-18 MED ORDER — VANCOMYCIN HCL 2000 MG/400ML IV SOLN
2000.0000 mg | Freq: Once | INTRAVENOUS | Status: AC
  Administered 2024-09-18: 2000 mg via INTRAVENOUS
  Filled 2024-09-18: qty 400

## 2024-09-18 NOTE — ED Provider Notes (Signed)
 " [place] [facility] Provider Note   CSN: [id number] Arrival date & time: 09/18/24  8078     Patient presents with: No chief complaint on file.   [name] is a 69 y.o. male.   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  He did miss his dialysis Thursday, last dialysis was Tuesday, the patient is not able to give any information as he is altered   Prior to Admission medications  Medication Sig Start Date End Date Taking? Authorizing Provider  apixaban  (ELIQUIS ) 5 MG TABS tablet Take 1 tablet (5 mg total) by mouth 2 (two) times daily. 01/14/22 09/09/24  Gonfa, Taye T, MD  atorvastatin  (LIPITOR ) 80 MG tablet Take 1 tablet (80 mg total) by mouth at bedtime. 11/25/23   Amoako, Prince, MD  B Complex-C-Zn-Folic Acid  (DIALYVITE /ZINC ) TABS Take 1 tablet by mouth See admin instructions. Give 1 tablet by mouth 3 days a week prior to dialysis on Tuesday, Thursday, Saturday. 10/04/21   [provider]  carvedilol  (COREG ) 6.25 MG tablet Take 1 tablet (6.25 mg total) by mouth 2 (two) times daily with a meal. 09/02/24   Samtani, Jai-Gurmukh, MD  colchicine  0.6 MG tablet Take 0.5 tablets (0.3 mg total) by mouth daily. Patient taking differently: Take 0.3 mg by mouth at bedtime. 02/21/24   Gonfa, Taye T, MD  Doxercalciferol  (HECTOROL  IV) Inject 2 mcg into the vein Every Tuesday,Thursday,and Saturday with dialysis. Medication is provided and administered by [facility]; Hectorol  IV 2mcg x3 week on  Tuesday, Thursday and Saturday - during dialysis treatment.    [provider]  famotidine  (PEPCID ) 20 MG tablet Take 20 mg by mouth See admin instructions. Give 1 tablet (20mg ) by mouth every other morning.    [provider]  fentaNYL  (DURAGESIC ) 12 MCG/HR Place 1 patch onto the skin every 3 (three) days. 09/04/24   Samtani, Jai-Gurmukh, MD  ferrous sulfate  325 (65 FE) MG tablet Take 325 mg by mouth in the morning and at bedtime.    [provider]  folic acid  (FOLVITE ) 1 MG tablet Take 1 mg by mouth daily. 09/18/21   [provider]  gabapentin  (NEURONTIN ) 100 MG capsule Take 100 mg by mouth 3 (three) times daily.    [provider]  insulin  aspart (NOVOLOG  FLEXPEN) 100 UNIT/ML FlexPen Inject 4-16 Units into the skin See admin instructions. Inject 4 units subcutaneously three times daily before meals, in ADDITION to 0-12 units per sliding scale: 70 - 150 : 0 units 151-200 : 2 units 201-250 : 4 units 251-300 : 6 units 301-350 : 8 units 351-400 : 10 units 401-450 : 12 units > 400 : give 12 units and recheck in 1 hour - if still > 400, call MD/NP for further orders.    [provider]  oxyCODONE  (OXY IR/ROXICODONE ) 5 MG immediate release tablet Take 1 tablet (5 mg total) by mouth every 6 (six) hours as needed for moderate pain (pain score 4-6) or severe pain (pain score 7-10). 09/02/24  Samtani, Jai-Gurmukh, MD  Phenylephrine  HCl (PREPARATION H) 0.25 % SUPP Place 1 suppository rectally every 6 (six) hours as needed (hemorrhoids).    [provider]  polyethylene glycol (MIRALAX  / GLYCOLAX ) 17 g packet Take 17 g by mouth daily as needed for moderate constipation. 09/02/24   Samtani, Jai-Gurmukh, MD  rOPINIRole  (REQUIP ) 2 MG tablet Take 2 mg by mouth at bedtime.    [provider]  senna (SENOKOT) 8.6 MG TABS tablet Take 17.2 mg by mouth at bedtime.    [provider]  sevelamer  carbonate (RENVELA ) 800 MG tablet Take 1  tablet (800 mg total) by mouth 3 (three) times daily with meals. 09/02/24   Samtani, Jai-Gurmukh, MD  tamsulosin  (FLOMAX ) 0.4 MG CAPS capsule Take 0.4 mg by mouth daily.    [provider]    Allergies: Contrast media [iodinated contrast media], Ivp dye [iodinated contrast media], Latex, and Tape    Review of Systems  Unable to perform ROS: Mental status change    Updated Vital Signs BP 119/75 (BP Location: Right Arm)   Pulse (!) 109   Temp (!) 102.8 F (39.3 C) (Rectal) Comment: Simultaneous filing. User may not have seen previous data. Comment (Src): Simultaneous filing. User may not have seen previous data.  Resp (!) 26   Ht 1.88 m (6' 2)   Wt 113.9 kg   SpO2 100%   BMI 32.24 kg/m   Physical Exam Constitutional:      General: He is in acute distress.     Appearance: He is ill-appearing.  HENT:     Nose: No congestion or rhinorrhea.     Mouth/Throat:     Mouth: Mucous membranes are dry.  Cardiovascular:     Rate and Rhythm: Tachycardia present. Rhythm irregular.     Comments: Tachycardic, good thrill in the fistula of the left upper extremity Pulmonary:     Effort: Pulmonary effort is normal.  Abdominal:     General: There is no distension.     Tenderness: There is no abdominal tenderness.  Musculoskeletal:     Right lower leg: No edema.     Left lower leg: No edema.     Comments: Bilateral below the knee amputations, stumps are intact, no redness or warmth near the surgical sites  Skin:    Findings: No rash.     Comments: Gangrenous area on the hand  Neurological:     Comments: The patient is altered, he will open his eyes, he will grunt, he will occasionally say yes or no, he cannot tell me where he lives     (all labs ordered are listed, but only abnormal results are displayed) Labs Reviewed  COMPREHENSIVE METABOLIC PANEL WITH GFR - Abnormal; Notable for the following components:      Result Value   Sodium 128 (*)    Potassium 6.3 (*)    Chloride  88 (*)    Glucose, Bld 148 (*)    BUN 69 (*)    Creatinine, Ser 7.47 (*)    Calcium  8.7 (*)    Total Protein 6.4 (*)    Alkaline Phosphatase 157 (*)    GFR, Estimated 7 (*)    Anion gap 16 (*)    All other components within normal limits  CBC WITH DIFFERENTIAL/PLATELET - Abnormal; Notable for the following components:   WBC 11.8 (*)    RBC 3.97 (*)    Hemoglobin 11.2 (*)    HCT 35.7 (*)    RDW  17.9 (*)    Platelets 139 (*)    Neutro Abs 9.6 (*)    All other components within normal limits  PROTIME-INR - Abnormal; Notable for the following components:   Prothrombin Time 25.9 (*)    INR 2.2 (*)    All other components within normal limits  RESP PANEL BY RT-PCR (RSV, FLU A&B, COVID)  RVPGX2  CULTURE, BLOOD (ROUTINE X 2)  CULTURE, BLOOD (ROUTINE X 2)  URINALYSIS, W/ REFLEX TO CULTURE (INFECTION SUSPECTED)  I-STAT CG4 LACTIC ACID, ED  I-STAT CG4 LACTIC ACID, ED    EKG: EKG Interpretation Date/Time:  Friday September 18 2024 19:27:44 EST Ventricular Rate:  114 PR Interval:  99 QRS Duration:  183 QT Interval:  349 QTC Calculation: 481 R Axis:   -84  Text Interpretation: afib with rvr RBBB and LAFB Abnormal T, consider ischemia, lateral leads Since last tracing rate faster Confirmed by Cleotilde Rogue ([id number]) on 09/18/2024 7:35:32 PM  Radiology: [facility] Chest Port 1 View Result Date: 09/18/2024 EXAM: 1 VIEW(S) XRAY OF THE CHEST 09/18/2024 07:43:00 PM COMPARISON: 08/25/2024 CLINICAL HISTORY: Questionable sepsis - evaluate for abnormality FINDINGS: LINES, TUBES AND DEVICES: Endotracheal and enteric tubes removed. LUNGS AND PLEURA: Stable pulmonary insufflation. New small-to-moderate left pleural effusion with associated left basilar atelectasis and resultant retrocardiac opacification. No pneumothorax. HEART AND MEDIASTINUM: Stable cardiomegaly. Aortic atherosclerotic calcification. BONES AND SOFT TISSUES: Cervical spine fusion hardware noted. No acute osseous abnormality. IMPRESSION: 1. New  small-to-moderate left pleural effusion with associated left basilar atelectasis and resultant retrocardiac opacification. 2. Stable cardiomegaly and aortic atherosclerotic calcification. Electronically signed by: Dorethia Molt MD MD 09/18/2024 07:55 PM EST RP Workstation: [id number]     .Critical Care  Performed by: Cleotilde Rogue, MD Authorized by: Cleotilde Rogue, MD   Critical care provider statement:    Critical care time (minutes):  45   Critical care time was exclusive of:  Teaching time and separately billable procedures and treating other patients   Critical care was necessary to treat or prevent imminent or life-threatening deterioration of the following conditions:  Sepsis   Critical care was time spent personally by me on the following activities:  Development of treatment plan with patient or surrogate, discussions with consultants, evaluation of patient's response to treatment, examination of patient, obtaining history from patient or surrogate, review of old charts, re-evaluation of patient's condition, pulse oximetry, ordering and review of radiographic studies, ordering and review of laboratory studies and ordering and performing treatments and interventions   I assumed direction of critical care for this patient from another provider in my specialty: no     Care discussed with: admitting provider   Comments:          Medications Ordered in the ED  lactated ringers  infusion ( Intravenous New Bag/Given 09/18/24 2020)  vancomycin  (VANCOREADY) IVPB 2000 mg/400 mL (2,000 mg Intravenous New Bag/Given 09/18/24 2134)  sodium zirconium cyclosilicate  (LOKELMA ) packet 10 g (has no administration in time range)  ceFEPIme  (MAXIPIME ) 2 g in sodium chloride  0.9 % 100 mL IVPB (0 g Intravenous Stopped 09/18/24 2052)  metroNIDAZOLE  (FLAGYL ) IVPB 500 mg (0 mg Intravenous Stopped 09/18/24 2132)    Clinical Course as of 09/18/24 2142  Fri Sep 18, 2024  2137 Pt has fever, elevated WBC, K of 6.3 and  evidence of pna on the xray - c/w possible ongoing pna - he is mildly tachypneic but not hypoxic.  Abx given after cultures were drawn.  Paged hospitalist for admission, critically ill [BM]  Clinical Course User Index [BM] Cleotilde Rogue, MD                                 Medical Decision Making Amount and/or Complexity of Data Reviewed Labs: ordered. Radiology: ordered.  Risk Prescription drug management. Decision regarding hospitalization.    This patient presents to the ED for concern of acute encephalopathy, this involves an extensive number of treatment options, and is a complaint that carries with it a high risk of complications and morbidity.  The differential diagnosis includes sepsis as the patient has a rectal temperature of 102.8, could be from influenza, could be from sepsis, he has missed dialysis, rule out hyperkalemia   Co morbidities / Chronic conditions that complicate the patient evaluation  End-stage renal disease on dialysis, Nursing home   Additional history obtained:  Additional history obtained from EMR External records from outside source obtained and reviewed including medical record, the patient had been admitted to the hospital about a month ago from pneumonia and pulmonary edema, was then admitted with an opiate overdose A-fib and altered mental status 3 weeks ago Currently at [facility]   Lab Tests:  I Ordered, and personally interpreted labs.  The pertinent results include:  elevated WBc, K of 6.2,   Imaging Studies ordered:  I ordered imaging studies including CXR  I independently visualized and interpreted imaging which showed possible ongoing pna / effusion I agree with the radiologist interpretation   Cardiac Monitoring: / EKG:  The patient was maintained on a cardiac monitor.  I personally viewed and interpreted the cardiac monitored which showed an underlying rhythm of: afib with RVR   Problem List / ED Course / Critical  interventions / Medication management  Septic, likely ongoing pna - but could be from bacteremia - given dialysis. I ordered medication including abx,    Reevaluation of the patient after these medicines showed that the patient some improvement I have reviewed the patients home medicines and have made adjustments as needed   Consultations Obtained:  I requested consultation with the nephrologist Dr. Gearline to arrange dialysis and the hospitalist.,  and discussed lab and imaging findings as well as pertinent plan - they recommend: admission - Appreciate their willingness to admit   Social Determinants of Health:  ESRD Nursing home   Test / Admission - Considered:  Needs high level of admission - sepsis, afib      Final diagnoses:  Sepsis, due to unspecified organism, unspecified whether acute organ dysfunction present [facility])  Hyperkalemia    ED Discharge Orders     None          Cleotilde Rogue, MD 09/18/24 2209  "

## 2024-09-18 NOTE — Sepsis Progress Note (Signed)
 Elink monitoring for the code sepsis protocol.

## 2024-09-18 NOTE — ED Notes (Signed)
 Dialysis pt infrequent urine

## 2024-09-18 NOTE — ED Notes (Signed)
 Pot 6.3 reported to [employer]

## 2024-09-18 NOTE — Progress Notes (Signed)
 Pharmacy Antibiotic Note  [name] is a 69 y.o. male for which pharmacy has been consulted for cefepime  and vancomycin  dosing for pneumonia and sepsis.  Patient with a history of ESRD on HD. Patient presenting from SNF with AMS.  Plan: Cefepime  2g x 1 given in ED --Will start cefepime  1g q24h to be given in the evening on post-HD days Vancomycin  2g x 1 in ED --further dosing per HD plan Monitor WBC, fever, renal function, cultures De-escalate when able F/u Nephrology plan F/u MRSA PCR  Height: 6' 2 (188 cm) Weight: 113.9 kg (251 lb 1.7 oz) IBW/kg (Calculated) : 82.2  Temp (24hrs), Avg:102.8 F (39.3 C), Min:102.8 F (39.3 C), Max:102.8 F (39.3 C)  Recent Labs  Lab 09/18/24 2017 09/18/24 2019  WBC  --  11.8*  CREATININE  --  7.47*  LATICACIDVEN 1.4  --     Estimated Creatinine Clearance: 12.4 mL/min (A) (by C-G formula based on SCr of 7.47 mg/dL (H)).    Allergies[1]  Microbiology results: Pending  Thank you for allowing pharmacy to be a part of this patients care.  Dorn Buttner, PharmD, BCPS 09/18/2024 10:28 PM ED Clinical Pharmacist -  [phone]      [1]  Allergies Allergen Reactions   Contrast Media [Iodinated Contrast Media] Shortness Of Breath and Other (See Comments)    Altered mental status     Ivp Dye [Iodinated Contrast Media] Anaphylaxis, Shortness Of Breath and Other (See Comments)    Altered mental status     Latex Rash    severe rash appears after the first 24 hours of being placed   Tape Rash and Other (See Comments)    Rash after 1 day of use  Do not leave on longer then 3 days without changing

## 2024-09-18 NOTE — ED Triage Notes (Signed)
 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

## 2024-09-18 NOTE — H&P (Signed)
 " History and Physical      [name] FMW:[id number] DOB: 1956/02/02 DOA: 09/18/2024; DOS: 09/18/2024  PCP: Montey Lot, PA-C *** Patient coming from: home ***  I have personally briefly reviewed patient's old medical records in [facility]  Chief Complaint: ***  HPI: [name] is a 69 y.o. male with medical history significant for *** who is admitted to [facility] on 09/18/2024 with *** after presenting from home*** to [facility] ED complaining of ***.    ***       ***   ED Course:  Vital signs in the ED were notable for the following: ***  Labs were notable for the following: ***  Per my interpretation, EKG in ED demonstrated the following:  ***  Imaging in the ED, per corresponding formal radiology read, was notable for the following:  ***  EDP discussed patient's case with on-call nephrology, Dr. Gearline, who will consult, and is adding the patient to the dialysis list.   While in the ED, the following were administered: ***  Subsequently, the patient was admitted  ***  ***red    Review of Systems: As per HPI otherwise 10 point review of systems negative.   Past Medical History:  Diagnosis Date   Carotid artery occlusion 11/10/2010   LEFT CAROTID ENDARTERECTOMY   Complication of anesthesia    BP WENT UP AT [facility]    COPD (chronic obstructive pulmonary disease) (HCC)    pt denies this dx as of 06/01/20 - no inhaler    Diabetes mellitus without complication (HCC)    Diverticulitis    Diverticulosis of colon (without mention of hemorrhage)    DJD (degenerative joint disease)    knees/hands/feet/back/neck   ESRD (end stage renal disease) on dialysis (HCC)    Fatty liver    Full dentures    GERD (gastroesophageal reflux disease)    H/O hiatal hernia    History of blood transfusion    with a past surical procedure per patient 06/01/20   History of right below knee amputation (HCC) 07/12/2016   Hyperlipidemia    Hypertension    Neuromuscular  disorder (HCC)    peripheral neuropathy   Non-pressure chronic ulcer of other part of left foot limited to breakdown of skin (HCC) 11/12/2016   Nonrheumatic aortic (valve) stenosis 03/18/2024   Limited TTE 03/18/2024: EF 55-60, moderate LVH, normal RVSF, moderate LAE, mild MR, mild-moderate AS (mean 12, V-max 235 cm/s, DI 0.35), aortic root 39 mm, moderate pleural effusion, trivial pericardial effusion    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 Atherectomy left popliteal artery July 2012.    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   Unstable angina (HCC) 09/16/2018   Wears glasses     Past Surgical History:  Procedure Laterality Date   ABDOMINAL AORTOGRAM W/LOWER EXTREMITY N/A 06/23/2021   Procedure: ABDOMINAL AORTOGRAM W/LOWER EXTREMITY;  Surgeon: Elmira Newman PARAS, MD;  [facility];  Service: Cardiovascular;  Laterality: N/A;   AMPUTATION  11/05/2011   Procedure: AMPUTATION RAY;  Surgeon: Norleen Armor, MD;  Location: [facility] OR;  Service: Orthopedics;  Laterality: Right;  Amputation of Right 4&5th Toes   AMPUTATION Left 11/26/2012   Procedure: AMPUTATION RAY;  Surgeon: Norleen Armor, MD;  Location: [facility] OR;  Service: Orthopedics;  Laterality: Left;  fourth ray amputation   AMPUTATION Right 08/27/2014   Procedure: Transmetatarsal Amputation;  Surgeon: Jerona Harden GAILS, MD;  Location: [facility] OR;  Service: Orthopedics;  Laterality: Right;   AMPUTATION Right 01/14/2015   Procedure: AMPUTATION BELOW KNEE;  Surgeon: Jerona Harden GAILS, MD;  Location: [facility] OR;  Service: Orthopedics;  Laterality: Right;   AMPUTATION Left 10/21/2015   Procedure: Left Foot 5th Ray Amputation;  Surgeon: Jerona Harden GAILS, MD;   Location: [facility] OR;  Service: Orthopedics;  Laterality: Left;   AMPUTATION Left 07/04/2022   Procedure: LEFT BELOW KNEE AMPUTATION;  Surgeon: Harden Jerona GAILS, MD;  Location: [facility] OR;  Service: Orthopedics;  Laterality: Left;   ANTERIOR FUSION CERVICAL SPINE  02/06/2006   C4-5, C5-6, C6-7; SURGEON DR. MAX COHEN   AV FISTULA PLACEMENT Left 06/02/2020   Procedure: ARTERIOVENOUS (AV) FISTULA CREATION LEFT;  Surgeon: Sheree Penne Bruckner, MD;  Location: [facility] OR;  Service: Vascular;  Laterality: Left;   BACK SURGERY     x 3   BASCILIC VEIN TRANSPOSITION Left 07/21/2020   Procedure: LEFT UPPER ARM ATERIOVENOUS SUPERFISTULALIZATION;  Surgeon: Sheree Penne Bruckner, MD;  Location: [facility] OR;  Service: Vascular;  Laterality: Left;   BELOW KNEE LEG AMPUTATION Right    CARDIAC CATHETERIZATION  10/31/2004   2009   CAROTID ENDARTERECTOMY  11/10/2010   CAROTID ENDARTERECTOMY Left 11/10/2010   Subtotal occlusion of left internal carotid artery with left hemispheric transient ischemic attacks.   CAROTID STENT     CARPAL TUNNEL RELEASE Right 10/21/2013   Procedure: RIGHT CARPAL TUNNEL RELEASE;  Surgeon: Arley JONELLE Curia, MD;  Location: [facility];  Service: Orthopedics;  Laterality: Right;   CHOLECYSTECTOMY     COLON SURGERY     COLONOSCOPY     COLOSTOMY REVERSAL  05/21/2018   ileostomy reversal   CORONARY LITHOTRIPSY N/A 11/24/2023   Procedure: CORONARY LITHOTRIPSY;  Surgeon: Jordan, Peter M, MD;  Location: [facility] INVASIVE CV LAB;  Service: Cardiovascular;  Laterality: N/A;   CORONARY STENT INTERVENTION N/A 11/24/2023   Procedure: CORONARY STENT INTERVENTION;  Surgeon: Jordan, Peter M, MD;  Location: [facility] INVASIVE CV LAB;  Service: Cardiovascular;  Laterality: N/A;   CYSTOSCOPY WITH STENT PLACEMENT Bilateral 01/13/2018   Procedure: CYSTOSCOPY WITH BILATERAL URETERAL CATHETER PLACEMENT;  Surgeon: Cam Morene ORN, MD;  Location: [facility] ORS;  Service: Urology;  Laterality: Bilateral;   ESOPHAGEAL  MANOMETRY Bilateral 07/19/2014   Procedure: ESOPHAGEAL MANOMETRY (EM);  Surgeon: Gordy CHRISTELLA Starch, MD;  Location: [facility] ENDOSCOPY;  Service: Gastroenterology;  Laterality: Bilateral;   EYE SURGERY Bilateral 2020   cataract   FEMORAL ARTERY STENT     x6   FINGER SURGERY     FOOT SURGERY  04/25/2016    EXCISION BASE 5TH METATARSAL AND PARTIAL CUBOID LEFT FOOT   HERNIA REPAIR     LEFT INGUINAL AND UMBILICAL REPAIRS   HERNIA REPAIR     I & D EXTREMITY Left 04/25/2016   Procedure: EXCISION BASE 5TH METATARSAL AND PARTIAL CUBOID LEFT FOOT;  Surgeon: Jerona GAILS Harden, MD;  Location: [facility] OR;  Service: Orthopedics;  Laterality: Left;   ILEOSTOMY  01/13/2018   Procedure: ILEOSTOMY;  Surgeon: Signe Mitzie LABOR, MD;  Location: [facility] ORS;  Service: General;;   ILEOSTOMY CLOSURE N/A 05/21/2018   Procedure: ILEOSTOMY REVERSAL ERAS PATHWAY;  Surgeon: Signe,  Mitzie LABOR, MD;  Location: [facility] OR;  Service: General;  Laterality: N/A;   IR RADIOLOGIST EVAL & MGMT  11/19/2017   IR RADIOLOGIST EVAL & MGMT  12/03/2017   IR RADIOLOGIST EVAL & MGMT  12/18/2017   JOINT REPLACEMENT Right 2001   Total knee   LAMINECTOMY     X 3 LUMBAR AND X 2 CERVICAL SPINE OPERATIONS   LAPAROSCOPIC CHOLECYSTECTOMY W/ CHOLANGIOGRAPHY  11/09/2004   SURGEON DR. DEWARD CANDIE NULL   LEFT HEART CATH AND CORONARY ANGIOGRAPHY N/A 09/16/2018   Procedure: LEFT HEART CATH AND CORONARY ANGIOGRAPHY;  Surgeon: Elmira Newman PARAS, MD;  [facility];  Service: Cardiovascular;  Laterality: N/A;   LEFT HEART CATH AND CORONARY ANGIOGRAPHY N/A 11/24/2023   Procedure: LEFT HEART CATH AND CORONARY ANGIOGRAPHY;  Surgeon: Jordan, Peter M, MD;  Location: [facility] INVASIVE CV LAB;  Service: Cardiovascular;  Laterality: N/A;   LEFT HEART CATHETERIZATION WITH CORONARY ANGIOGRAM N/A 10/29/2014   Procedure: LEFT HEART CATHETERIZATION WITH CORONARY ANGIOGRAM;  Surgeon: Erick JONELLE Bergamo, MD;  Location: [facility] CATH LAB;  Service: Cardiovascular;  Laterality: N/A;    LIGATION OF COMPETING BRANCHES OF ARTERIOVENOUS FISTULA Left 07/21/2020   Procedure: LIGATION OF COMPETING BRANCHES OF LEFT UPPER ARM ARTERIOVENOUS FISTULA;  Surgeon: Sheree Penne Bruckner, MD;  Location: [facility] OR;  Service: Vascular;  Laterality: Left;   LOWER EXTREMITY ANGIOGRAM N/A 03/19/2012   Procedure: LOWER EXTREMITY ANGIOGRAM;  Surgeon: Bruckner JONETTA Cash, MD;  Location: [facility] CATH LAB;  Service: Cardiovascular;  Laterality: N/A;   LOWER EXTREMITY ANGIOGRAPHY N/A 06/20/2021   Procedure: LOWER EXTREMITY ANGIOGRAPHY;  Surgeon: Elmira Newman PARAS, MD;  [facility];  Service: Cardiovascular;  Laterality: N/A;   NECK SURGERY     PARTIAL COLECTOMY N/A 01/13/2018   Procedure: LAPAROSCOPIC ASSISTED   SIGMOID COLECTOMY ILEOSTOMY;  Surgeon: Signe Mitzie LABOR, MD;  Location: [facility]S;  Service: General;  Laterality: N/A;   PENILE PROSTHESIS IMPLANT  08/14/2005   INFRAPUBIC INSERTION OF INFLATABLE PENILE PROSTHESIS; SURGEON DR. JANIT   PENILE PROSTHESIS IMPLANT     PERCUTANEOUS CORONARY STENT INTERVENTION (PCI-S) Right 10/29/2014   Procedure: PERCUTANEOUS CORONARY STENT INTERVENTION (PCI-S);  Surgeon: Erick JONELLE Bergamo, MD;  Location: [facility] CATH LAB;  Service: Cardiovascular;  Laterality: Right;   PERICARDIOCENTESIS N/A 02/13/2024   Procedure: PERICARDIOCENTESIS;  Surgeon: Elmira Newman PARAS, MD;  [facility];  Service: Cardiovascular;  Laterality: N/A;   PERIPHERAL VASCULAR INTERVENTION Left 06/23/2021   Procedure: PERIPHERAL VASCULAR INTERVENTION;  Surgeon: Elmira Newman PARAS, MD;  [facility];  Service: Cardiovascular;  Laterality: Left;   REMOVAL OF PENILE PROSTHESIS N/A 06/14/2021   Procedure: Removal of THREE piece inflatable penile prosthesis;  Surgeon: Carolee Sherwood JONETTA DOUGLAS, MD;  Location: [facility] OR;  Service: Urology;  Laterality: N/A;   SHOULDER ARTHROSCOPY     SPINE SURGERY     TEMPORARY PACEMAKER N/A 02/13/2024   Procedure: TEMPORARY  PACEMAKER;  Surgeon: Elmira Newman PARAS, MD;  [facility];  Service: Cardiovascular;  Laterality: N/A;   TOE AMPUTATION Left    TONSILLECTOMY     TOTAL KNEE ARTHROPLASTY  07/2002   RIGHT KNEE ; SURGEON  DR. [name] ALSO HAD ARTHROSCOPIC RIGHT KNEE IN  10/2001   TOTAL KNEE ARTHROPLASTY     ULNAR NERVE TRANSPOSITION Right 10/21/2013   Procedure: RIGHT ELBOW  ULNAR NERVE DECOMPRESSION;  Surgeon: Arley JONELLE Curia, MD;  Location: [facility];  Service: Orthopedics;  Laterality:  Right;    Social History:  reports that he has been smoking cigarettes. He has a 35 pack-year smoking history. He has never used smokeless tobacco. He reports that he does not currently use alcohol . He reports that he does not use drugs.   Allergies[1]  Family History  Problem Relation Age of Onset   Heart disease Father        Before age 28-  CAD, BPG   Diabetes Father        Amputation   Cancer Father        PROSTATE   Hyperlipidemia Father    Hypertension Father    Heart attack Father        Triple BPG   Varicose Veins Father    Cancer Sister        Breast   Hyperlipidemia Sister    Hypertension Sister    Heart attack Brother    Colon cancer Brother    Diabetes Brother    Heart disease Brother 73       A-Fib. Before age 49   Hyperlipidemia Brother    Hypertension Brother    Hypertension Son    Arthritis Other        GRANDMOTHER   Hypertension Other        OTHER FAMILY MEMBERS    Family history reviewed and not pertinent ***   Prior to Admission medications  Medication Sig Start Date End Date Taking? Authorizing Provider  apixaban  (ELIQUIS ) 5 MG TABS tablet Take 1 tablet (5 mg total) by mouth 2 (two) times daily. 01/14/22 09/09/24  Gonfa, Taye T, MD  atorvastatin  (LIPITOR ) 80 MG tablet Take 1 tablet (80 mg total) by mouth at bedtime. 11/25/23   Amoako, Prince, MD  B Complex-C-Zn-Folic Acid  (DIALYVITE /ZINC ) TABS Take 1 tablet by mouth See admin instructions. Give 1  tablet by mouth 3 days a week prior to dialysis on Tuesday, Thursday, Saturday. 10/04/21   [provider]  carvedilol  (COREG ) 6.25 MG tablet Take 1 tablet (6.25 mg total) by mouth 2 (two) times daily with a meal. 09/02/24   Samtani, Jai-Gurmukh, MD  colchicine  0.6 MG tablet Take 0.5 tablets (0.3 mg total) by mouth daily. Patient taking differently: Take 0.3 mg by mouth at bedtime. 02/21/24   Gonfa, Taye T, MD  Doxercalciferol  (HECTOROL  IV) Inject 2 mcg into the vein Every Tuesday,Thursday,and Saturday with dialysis. Medication is provided and administered by [facility]; Hectorol  IV 2mcg x3 week on Tuesday, Thursday and Saturday - during dialysis treatment.    [provider]  famotidine  (PEPCID ) 20 MG tablet Take 20 mg by mouth See admin instructions. Give 1 tablet (20mg ) by mouth every other morning.    [provider]  fentaNYL  (DURAGESIC ) 12 MCG/HR Place 1 patch onto the skin every 3 (three) days. 09/04/24   Samtani, Jai-Gurmukh, MD  ferrous sulfate  325 (65 FE) MG tablet Take 325 mg by mouth in the morning and at bedtime.    [provider]  folic acid  (FOLVITE ) 1 MG tablet Take 1 mg by mouth daily. 09/18/21   [provider]  gabapentin  (NEURONTIN ) 100 MG capsule Take 100 mg by mouth 3 (three) times daily.    [provider]  insulin  aspart (NOVOLOG  FLEXPEN) 100 UNIT/ML FlexPen Inject 4-16 Units into the skin See admin instructions. Inject 4 units subcutaneously three times daily before meals, in ADDITION to 0-12 units per sliding scale: 70 - 150 : 0 units 151-200 : 2 units 201-250 : 4 units  251-300 : 6 units 301-350 : 8 units 351-400 : 10 units 401-450 : 12 units > 400 : give 12 units and recheck in 1 hour - if still > 400, call MD/NP for further orders.    [provider]  oxyCODONE  (OXY IR/ROXICODONE ) 5 MG immediate release tablet Take 1 tablet (5 mg total) by mouth every 6 (six) hours as needed for moderate pain  (pain score 4-6) or severe pain (pain score 7-10). 09/02/24   Samtani, Jai-Gurmukh, MD  Phenylephrine  HCl (PREPARATION H) 0.25 % SUPP Place 1 suppository rectally every 6 (six) hours as needed (hemorrhoids).    [provider]  polyethylene glycol (MIRALAX  / GLYCOLAX ) 17 g packet Take 17 g by mouth daily as needed for moderate constipation. 09/02/24   Samtani, Jai-Gurmukh, MD  rOPINIRole  (REQUIP ) 2 MG tablet Take 2 mg by mouth at bedtime.    [provider]  senna (SENOKOT) 8.6 MG TABS tablet Take 17.2 mg by mouth at bedtime.    [provider]  sevelamer  carbonate (RENVELA ) 800 MG tablet Take 1 tablet (800 mg total) by mouth 3 (three) times daily with meals. 09/02/24   Samtani, Jai-Gurmukh, MD  tamsulosin  (FLOMAX ) 0.4 MG CAPS capsule Take 0.4 mg by mouth daily.    [provider]     Objective    Physical Exam: Vitals:   09/18/24 1926 09/18/24 1927  BP:  119/75  Pulse:  (!) 109  Resp:  (!) 26  Temp:  (!) 102.8 F (39.3 C)  TempSrc:  Rectal  SpO2:  100%  Weight: 113.9 kg   Height: 6' 2 (1.88 m)     General: appears to be stated age; alert, oriented Skin: warm, dry, no rash Head:  AT/[place] Mouth:  Oral mucosa membranes appear moist, normal dentition Neck: supple; trachea midline Heart:  RRR; did not appreciate any M/R/G Lungs: CTAB, did not appreciate any wheezes, rales, or rhonchi Abdomen: + BS; soft, ND, NT Vascular: 2+ pedal pulses b/l; 2+ radial pulses b/l Extremities: no peripheral edema, no muscle wasting   ***   *** Neuro: strength and sensation intact in upper and lower extremities b/l  *** Neuro: 5/5 strength of the proximal and distal flexors and extensors of the upper and lower extremities bilaterally; sensation intact in upper and lower extremities b/l; cranial nerves II through XII grossly intact; no pronator drift; no evidence suggestive of slurred speech, dysarthria, or facial droop; Normal muscle tone. No  tremors. *** Neuro: In the setting of the patient's current mental status and associated inability to follow instructions, unable to perform full neurologic exam at this time.  As such, assessment of strength, sensation, and cranial nerves is limited at this time. Patient noted to spontaneously move all 4 extremities. No tremors.  ***        Labs on Admission: I have personally reviewed following labs and imaging studies  CBC: Recent Labs  Lab 09/18/24 2019  WBC 11.8*  NEUTROABS 9.6*  HGB 11.2*  HCT 35.7*  MCV 89.9  PLT 139*   Basic Metabolic Panel: Recent Labs  Lab 09/18/24 2019  NA 128*  K 6.3*  CL 88*  CO2 24  GLUCOSE 148*  BUN 69*  CREATININE 7.47*  CALCIUM  8.7*   GFR: Estimated Creatinine Clearance: 12.4 mL/min (A) (by C-G formula based on SCr of 7.47 mg/dL (H)). Liver Function Tests: Recent Labs  Lab 09/18/24 2019  AST 21  ALT 15  ALKPHOS 157*  BILITOT 0.7  PROT 6.4*  ALBUMIN  3.8   No results for input(s): LIPASE, AMYLASE in the last 168 hours. No results for input(s): AMMONIA in the last 168 hours. Coagulation Profile: Recent Labs  Lab 09/18/24 2019  INR 2.2*   Cardiac Enzymes: No results for input(s): CKTOTAL, CKMB, CKMBINDEX, TROPONINI in the last 168 hours. BNP (last 3 results) Recent Labs    08/25/24 1415  PROBNP >35,000.0*   HbA1C: No results for input(s): HGBA1C in the last 72 hours. CBG: No results for input(s): GLUCAP in the last 168 hours. Lipid Profile: No results for input(s): CHOL, HDL, LDLCALC, TRIG, CHOLHDL, LDLDIRECT in the last 72 hours. Thyroid  Function Tests: No results for input(s): TSH, T4TOTAL, FREET4, T3FREE, THYROIDAB in the last 72 hours. Anemia Panel: No results for input(s): VITAMINB12, FOLATE, FERRITIN, TIBC, IRON, RETICCTPCT in the last 72 hours. Urine analysis:    Component Value Date/Time   COLORURINE YELLOW 08/25/2024 1903   APPEARANCEUR TURBID (A)  08/25/2024 1903   LABSPEC 1.020 08/25/2024 1903   PHURINE 6.5 08/25/2024 1903   GLUCOSEU 100 (A) 08/25/2024 1903   HGBUR LARGE (A) 08/25/2024 1903   BILIRUBINUR NEGATIVE 08/25/2024 1903   KETONESUR NEGATIVE 08/25/2024 1903   PROTEINUR >300 (A) 08/25/2024 1903   UROBILINOGEN 0.2 01/14/2015 0346   NITRITE NEGATIVE 08/25/2024 1903   LEUKOCYTESUR MODERATE (A) 08/25/2024 1903    Radiological Exams on Admission: DG Chest Port 1 View Result Date: 09/18/2024 EXAM: 1 VIEW(S) XRAY OF THE CHEST 09/18/2024 07:43:00 PM COMPARISON: 08/25/2024 CLINICAL HISTORY: Questionable sepsis - evaluate for abnormality FINDINGS: LINES, TUBES AND DEVICES: Endotracheal and enteric tubes removed. LUNGS AND PLEURA: Stable pulmonary insufflation. New small-to-moderate left pleural effusion with associated left basilar atelectasis and resultant retrocardiac opacification. No pneumothorax. HEART AND MEDIASTINUM: Stable cardiomegaly. Aortic atherosclerotic calcification. BONES AND SOFT TISSUES: Cervical spine fusion hardware noted. No acute osseous abnormality. IMPRESSION: 1. New small-to-moderate left pleural effusion with associated left basilar atelectasis and resultant retrocardiac opacification. 2. Stable cardiomegaly and aortic atherosclerotic calcification. Electronically signed by: Dorethia Molt MD MD 09/18/2024 07:55 PM EST RP Workstation: [id number]      Assessment/Plan   Active Problems:   Acute metabolic  encephalopathy   ***            ***                     ***                      ***                     ***                     ***                     ***                      ***                     ***                     ***                     ***                     ***                    ***                   ***  DVT prophylaxis: SCD's ***  Code Status: Full code*** Family Communication: none*** Disposition Plan: Per Rounding Team Consults called: EDP discussed patient's case with on-call nephrology, Dr. Gearline, who will consult, and is adding the patient to the dialysis list. ;  Admission status: ***     I SPENT GREATER THAN 75 *** MINUTES IN CLINICAL CARE TIME/MEDICAL DECISION-MAKING IN COMPLETING THIS ADMISSION.      Eva NOVAK Aydden Cumpian DO [facility]  From 7PM - 7AM   09/18/2024, 10:41 PM   ***      [1]  Allergies Allergen Reactions   Contrast Media [Iodinated Contrast Media] Shortness Of Breath and Other (See Comments)    Altered mental status     Ivp Dye [Iodinated Contrast Media] Anaphylaxis, Shortness Of Breath and Other (See Comments)    Altered mental status     Latex Rash    severe rash appears after the first 24 hours of being placed   Tape Rash and Other (See Comments)    Rash after 1 day of use  Do not leave on longer then 3 days without changing   "

## 2024-09-19 DIAGNOSIS — G9341 Metabolic encephalopathy: Secondary | ICD-10-CM | POA: Diagnosis not present

## 2024-09-19 DIAGNOSIS — J189 Pneumonia, unspecified organism: Secondary | ICD-10-CM | POA: Diagnosis present

## 2024-09-19 DIAGNOSIS — R509 Fever, unspecified: Secondary | ICD-10-CM | POA: Diagnosis present

## 2024-09-19 LAB — BLOOD CULTURE ID PANEL (REFLEXED) - BCID2
A.calcoaceticus-baumannii: NOT DETECTED
Bacteroides fragilis: NOT DETECTED
CTX-M ESBL: NOT DETECTED
Candida albicans: NOT DETECTED
Candida auris: NOT DETECTED
Candida glabrata: NOT DETECTED
Candida krusei: NOT DETECTED
Candida parapsilosis: NOT DETECTED
Candida tropicalis: NOT DETECTED
Carbapenem resist OXA 48 LIKE: NOT DETECTED
Carbapenem resistance IMP: NOT DETECTED
Carbapenem resistance KPC: NOT DETECTED
Carbapenem resistance NDM: NOT DETECTED
Carbapenem resistance VIM: NOT DETECTED
Cryptococcus neoformans/gattii: NOT DETECTED
Enterobacter cloacae complex: NOT DETECTED
Enterobacterales: DETECTED — AB
Enterococcus Faecium: NOT DETECTED
Enterococcus faecalis: NOT DETECTED
Escherichia coli: NOT DETECTED
Haemophilus influenzae: NOT DETECTED
Klebsiella aerogenes: NOT DETECTED
Klebsiella oxytoca: NOT DETECTED
Klebsiella pneumoniae: NOT DETECTED
Listeria monocytogenes: NOT DETECTED
Neisseria meningitidis: NOT DETECTED
Proteus species: DETECTED — AB
Pseudomonas aeruginosa: NOT DETECTED
Salmonella species: NOT DETECTED
Serratia marcescens: NOT DETECTED
Staphylococcus aureus (BCID): NOT DETECTED
Staphylococcus epidermidis: NOT DETECTED
Staphylococcus lugdunensis: NOT DETECTED
Staphylococcus species: NOT DETECTED
Stenotrophomonas maltophilia: NOT DETECTED
Streptococcus agalactiae: NOT DETECTED
Streptococcus pneumoniae: NOT DETECTED
Streptococcus pyogenes: NOT DETECTED
Streptococcus species: NOT DETECTED

## 2024-09-19 LAB — TSH: TSH: 1.24 u[IU]/mL (ref 0.350–4.500)

## 2024-09-19 LAB — CBC WITH DIFFERENTIAL/PLATELET
Abs Immature Granulocytes: 0.04 K/uL (ref 0.00–0.07)
Basophils Absolute: 0 K/uL (ref 0.0–0.1)
Basophils Relative: 1 %
Eosinophils Absolute: 0 K/uL (ref 0.0–0.5)
Eosinophils Relative: 0 %
HCT: 30.9 % — ABNORMAL LOW (ref 39.0–52.0)
Hemoglobin: 10 g/dL — ABNORMAL LOW (ref 13.0–17.0)
Immature Granulocytes: 1 %
Lymphocytes Relative: 11 %
Lymphs Abs: 0.9 K/uL (ref 0.7–4.0)
MCH: 28.6 pg (ref 26.0–34.0)
MCHC: 32.4 g/dL (ref 30.0–36.0)
MCV: 88.3 fL (ref 80.0–100.0)
Monocytes Absolute: 0.8 K/uL (ref 0.1–1.0)
Monocytes Relative: 9 %
Neutro Abs: 6.8 K/uL (ref 1.7–7.7)
Neutrophils Relative %: 78 %
Platelets: 118 K/uL — ABNORMAL LOW (ref 150–400)
RBC: 3.5 MIL/uL — ABNORMAL LOW (ref 4.22–5.81)
RDW: 18.1 % — ABNORMAL HIGH (ref 11.5–15.5)
WBC: 8.6 K/uL (ref 4.0–10.5)
nRBC: 0 % (ref 0.0–0.2)

## 2024-09-19 LAB — GLUCOSE, CAPILLARY
Glucose-Capillary: 130 mg/dL — ABNORMAL HIGH (ref 70–99)
Glucose-Capillary: 104 mg/dL — ABNORMAL HIGH (ref 70–99)

## 2024-09-19 LAB — COMPREHENSIVE METABOLIC PANEL WITH GFR
ALT: 14 U/L (ref 0–44)
AST: 19 U/L (ref 15–41)
Albumin: 3.5 g/dL (ref 3.5–5.0)
Alkaline Phosphatase: 135 U/L — ABNORMAL HIGH (ref 38–126)
Anion gap: 21 — ABNORMAL HIGH (ref 5–15)
BUN: 71 mg/dL — ABNORMAL HIGH (ref 8–23)
CO2: 21 mmol/L — ABNORMAL LOW (ref 22–32)
Calcium: 8.6 mg/dL — ABNORMAL LOW (ref 8.9–10.3)
Chloride: 90 mmol/L — ABNORMAL LOW (ref 98–111)
Creatinine, Ser: 7.74 mg/dL — ABNORMAL HIGH (ref 0.61–1.24)
GFR, Estimated: 7 mL/min — ABNORMAL LOW
Glucose, Bld: 121 mg/dL — ABNORMAL HIGH (ref 70–99)
Potassium: 5.8 mmol/L — ABNORMAL HIGH (ref 3.5–5.1)
Sodium: 132 mmol/L — ABNORMAL LOW (ref 135–145)
Total Bilirubin: 0.7 mg/dL (ref 0.0–1.2)
Total Protein: 5.9 g/dL — ABNORMAL LOW (ref 6.5–8.1)

## 2024-09-19 LAB — PRO BRAIN NATRIURETIC PEPTIDE
Pro Brain Natriuretic Peptide: >35000 pg/mL — ABNORMAL HIGH
Pro Brain Natriuretic Peptide: >35000 pg/mL — ABNORMAL HIGH

## 2024-09-19 LAB — I-STAT VENOUS BLOOD GAS, ED
Acid-base deficit: 1 mmol/L (ref 0.0–2.0)
Bicarbonate: 24.3 mmol/L (ref 20.0–28.0)
Calcium, Ion: 0.8 mmol/L — CL (ref 1.15–1.40)
HCT: 32 % — ABNORMAL LOW (ref 39.0–52.0)
Hemoglobin: 10.9 g/dL — ABNORMAL LOW (ref 13.0–17.0)
O2 Saturation: 91 %
Patient temperature: 37
Potassium: 5.6 mmol/L — ABNORMAL HIGH (ref 3.5–5.1)
Sodium: 130 mmol/L — ABNORMAL LOW (ref 135–145)
TCO2: 26 mmol/L (ref 22–32)
pCO2, Ven: 41 mmHg — ABNORMAL LOW (ref 44–60)
pH, Ven: 7.381 (ref 7.25–7.43)
pO2, Ven: 62 mmHg — ABNORMAL HIGH (ref 32–45)

## 2024-09-19 LAB — PROTIME-INR
INR: 2.4 — ABNORMAL HIGH (ref 0.8–1.2)
Prothrombin Time: 27.3 s — ABNORMAL HIGH (ref 11.4–15.2)

## 2024-09-19 LAB — PHOSPHORUS: Phosphorus: 6.8 mg/dL — ABNORMAL HIGH (ref 2.5–4.6)

## 2024-09-19 LAB — HEPATITIS B SURFACE ANTIGEN: Hepatitis B Surface Ag: NONREACTIVE

## 2024-09-19 LAB — CBG MONITORING, ED
Glucose-Capillary: 127 mg/dL — ABNORMAL HIGH (ref 70–99)
Glucose-Capillary: 94 mg/dL (ref 70–99)

## 2024-09-19 LAB — BLOOD GAS, VENOUS
Acid-Base Excess: 1.1 mmol/L (ref 0.0–2.0)
Bicarbonate: 25.1 mmol/L (ref 20.0–28.0)
O2 Saturation: 89.9 %
Patient temperature: 37.2
pCO2, Ven: 37 mmHg — ABNORMAL LOW (ref 44–60)
pH, Ven: 7.44 — ABNORMAL HIGH (ref 7.25–7.43)
pO2, Ven: 58 mmHg — ABNORMAL HIGH (ref 32–45)

## 2024-09-19 LAB — AMMONIA: Ammonia: 41 umol/L — ABNORMAL HIGH (ref 9–35)

## 2024-09-19 LAB — MAGNESIUM
Magnesium: 2.2 mg/dL (ref 1.7–2.4)
Magnesium: 2 mg/dL (ref 1.7–2.4)

## 2024-09-19 LAB — VITAMIN B12: Vitamin B-12: 767 pg/mL (ref 180–914)

## 2024-09-19 LAB — PROCALCITONIN: Procalcitonin: 2.2 ng/mL

## 2024-09-19 LAB — APTT: aPTT: 45 s — ABNORMAL HIGH (ref 24–36)

## 2024-09-19 MED ORDER — ALTEPLASE 2 MG IJ SOLR: 2.0000 mg | Freq: Once | INTRAMUSCULAR | Status: DC | PRN

## 2024-09-19 MED ORDER — PENTAFLUOROPROP-TETRAFLUOROETH EX AERO: 1.0000 | INHALATION_SPRAY | CUTANEOUS | Status: DC | PRN

## 2024-09-19 MED ORDER — AZITHROMYCIN 250 MG PO TABS
500.0000 mg | ORAL_TABLET | Freq: Every day | ORAL | Status: AC
  Administered 2024-09-19 – 2024-09-21 (×3): 500 mg via ORAL
  Filled 2024-09-19: qty 2
  Filled 2024-09-19: qty 1
  Filled 2024-09-19 (×2): qty 2

## 2024-09-19 MED ORDER — ANTICOAGULANT SODIUM CITRATE 4% (200MG/5ML) IV SOLN: 5.0000 mL | Status: DC | PRN

## 2024-09-19 MED ORDER — LIDOCAINE-PRILOCAINE 2.5-2.5 % EX CREA: 1.0000 | TOPICAL_CREAM | CUTANEOUS | Status: DC | PRN

## 2024-09-19 MED ORDER — SODIUM CHLORIDE 0.9 % IV SOLN
2.0000 g | INTRAVENOUS | Status: DC
  Administered 2024-09-19 – 2024-09-21 (×3): 2 g via INTRAVENOUS
  Filled 2024-09-19 (×4): qty 20

## 2024-09-19 MED ORDER — CARMEX CLASSIC LIP BALM EX OINT
1.0000 | TOPICAL_OINTMENT | CUTANEOUS | Status: DC | PRN
  Administered 2024-09-20: 1 via TOPICAL
  Filled 2024-09-19: qty 10

## 2024-09-19 MED ORDER — HEPARIN SODIUM (PORCINE) 1000 UNIT/ML DIALYSIS: 1000.0000 [IU] | INTRAMUSCULAR | Status: DC | PRN

## 2024-09-19 MED ORDER — NICOTINE 21 MG/24HR TD PT24: 21.0000 mg | MEDICATED_PATCH | Freq: Every day | TRANSDERMAL | Status: DC | PRN

## 2024-09-19 MED ORDER — LIDOCAINE HCL (PF) 1 % IJ SOLN: 5.0000 mL | INTRAMUSCULAR | Status: DC | PRN

## 2024-09-19 MED ORDER — POLYETHYLENE GLYCOL 3350 17 G PO PACK: 17.0000 g | PACK | Freq: Every day | ORAL | Status: DC | PRN

## 2024-09-19 MED ORDER — METOPROLOL TARTRATE 5 MG/5ML IV SOLN: 5.0000 mg | INTRAVENOUS | Status: DC | PRN

## 2024-09-19 MED ORDER — FERROUS SULFATE 325 (65 FE) MG PO TABS
325.0000 mg | ORAL_TABLET | Freq: Every day | ORAL | Status: DC
  Administered 2024-09-20 – 2024-09-25 (×5): 325 mg via ORAL
  Filled 2024-09-19 (×5): qty 1

## 2024-09-19 MED ORDER — SEVELAMER CARBONATE 800 MG PO TABS
800.0000 mg | ORAL_TABLET | Freq: Three times a day (TID) | ORAL | Status: DC
  Administered 2024-09-19 – 2024-09-25 (×15): 800 mg via ORAL
  Filled 2024-09-19 (×15): qty 1

## 2024-09-19 MED ORDER — INSULIN ASPART 100 UNIT/ML IJ SOLN
0.0000 [IU] | Freq: Three times a day (TID) | INTRAMUSCULAR | Status: DC
  Administered 2024-09-20 – 2024-09-25 (×9): 1 [IU] via SUBCUTANEOUS
  Filled 2024-09-19 (×9): qty 1

## 2024-09-19 MED ORDER — HEPARIN SODIUM (PORCINE) 1000 UNIT/ML IJ SOLN
INTRAMUSCULAR | Status: AC
  Filled 2024-09-19: qty 5

## 2024-09-19 MED ORDER — ATORVASTATIN CALCIUM 80 MG PO TABS
80.0000 mg | ORAL_TABLET | Freq: Every day | ORAL | Status: DC
  Administered 2024-09-19 – 2024-09-24 (×6): 80 mg via ORAL
  Filled 2024-09-19 (×6): qty 1

## 2024-09-19 MED ORDER — MUSCLE RUB 10-15 % EX CREA
1.0000 | TOPICAL_CREAM | CUTANEOUS | Status: DC | PRN
  Administered 2024-09-20 (×2): 1 via TOPICAL
  Filled 2024-09-19: qty 85

## 2024-09-19 MED ORDER — ALUM & MAG HYDROXIDE-SIMETH 200-200-20 MG/5ML PO SUSP: 30.0000 mL | ORAL | Status: DC | PRN

## 2024-09-19 MED ORDER — BENZONATATE 100 MG PO CAPS: 200.0000 mg | ORAL_CAPSULE | Freq: Three times a day (TID) | ORAL | Status: DC | PRN

## 2024-09-19 NOTE — Progress Notes (Addendum)
 " PROGRESS NOTE  [name]  FMW:[id number] DOB: April 09, 1956 DOA: 09/18/2024 PCP: Montey Lot, PA-C   Brief Narrative: Patient is a 69 year old male with history of bilateral BKA, chronic A-fib on Eliquis , ESRD on dialysis on TTS schedule, insulin -dependent diabetes type 2, hypertension, moderate aortic stenosis, chronic pain syndrome, chronic right-sided systolic heart failure who presented with complaint of confusion, fever.  On presentation he was febrile, maintaining saturation on room air.  Lab work showed sodium of 128, potassium 6.3, lactate of 1.4, WBC count of 11.8.  COVID/flu/RSV negative.  EKG showed atrial fibrillation.  Chest x-ray showed left lower lobe airspace opacities concerning for pneumonia, new small-moderate left pleural effusion.  Nephrology consulted for dialysis.  Currently being managed for  pneumonia.  Assessment & Plan:  Principal Problem:   Acute metabolic encephalopathy Active Problems:   Atrial fibrillation, chronic (HCC)   History of anemia due to chronic kidney disease   End-stage renal disease on hemodialysis (HCC)   Severe sepsis (HCC)   DM2 (diabetes mellitus, type 2) (HCC)   HLD (hyperlipidemia)   Acute hyponatremia   Tobacco abuse   Hyperkalemia   Moderate aortic stenosis   HCAP (healthcare-associated pneumonia)   Fever   Acute metabolic encephalopathy: Usually alert and oriented.  Presented with confusion, fever.  Takes fentanyl  patch, oxycodone , gabapentin .  Polypharmacy could also be possible.  MRI done on 12/18 showed chronic encephalomalacia changes in the right parietal lobe and to a lesser extent the right frontal lobe.Moderately advanced cerebral white matter disease bilaterally. Monitor mentation. TSH, vitamin B12 optimal.  Mildly elevated ammonia level of 41, we do not think this is contributing to his confusion. Confusion likely from metabolic encephalopathy from sepsis secondary to pneumonia. CT head not done on this admission.  Will  order CT head if his mentation does not improve. This morning he was oriented to place and told correct month  Sepsis secondary to pneumonia/HCAP: Presented with fever.  Chest x-ray showed left lower lobe opacity concerning for pneumonia.  Initial lactate was normal.  Continue current antibiotics.  Follow-up cultures  ESRD on dialysis/hyperkalemia: Nephrology following for dialysis.  Has AV fistula on the left upper extremity  Acute on chronic hypoosmolar hyponatremia: Volume management per dialysis.  Chronic A-fib: Monitor on telemetry.  On Eliquis .  Takes Coreg  for rate control.  Type 2 diabetes: Continue current insulin  regimen.  Monitor blood sugars.  Recent A1c of 6.9.  Hyperlipidemia: On Lipitor   History of moderate aortic stenosis: Patient echo showed EF of 50 to 55%, moderate aortic stenosis.  We recommend to follow-up with cardiology as an outpatient  Anemia chronic disease: Currently hemoglobin stable  Chronic tobacco use: Continues to smoke.  Counseled cessation.  Continue current patch  Debility/deconditioning: Patient residing at [facility].  Patient has bilateral BKA     Wound 08/25/24 2047 Pressure Injury Sacrum Stage 2 -  Partial thickness loss of dermis presenting as a shallow open injury with a red, pink wound bed without slough. (Active)     Wound 08/25/24 2047 Pressure Injury Thigh Left;Posterior Stage 1 -  Intact skin with non-blanchable redness of a localized area usually over a bony prominence. (Active)    DVT prophylaxis: apixaban  (ELIQUIS ) tablet 5 mg     Code Status: Do not attempt resuscitation (DNR) PRE-ARREST INTERVENTIONS DESIRED  Family Communication: None at the bedside  Patient status: Inpatient  Patient is from : SNF  Anticipated discharge to: SNF  Estimated DC date: 1 to 2 days  Consultants: Nephrology  Procedures: Dialysis  Antimicrobials:  Anti-infectives (From admission, onward)    Start     Dose/Rate Route Frequency  Ordered Stop   09/19/24 2200  ceFEPIme  (MAXIPIME ) 1 g in sodium chloride  0.9 % 100 mL IVPB        1 g 200 mL/hr over 30 Minutes Intravenous Every 24 hours 09/18/24 2230     09/18/24 1945  vancomycin  (VANCOREADY) IVPB 2000 mg/400 mL        2,000 mg 200 mL/hr over 120 Minutes Intravenous  Once 09/18/24 1938 09/18/24 2334   09/18/24 1930  ceFEPIme  (MAXIPIME ) 2 g in sodium chloride  0.9 % 100 mL IVPB        2 g 200 mL/hr over 30 Minutes Intravenous  Once 09/18/24 1928 09/18/24 2052   09/18/24 1930  metroNIDAZOLE  (FLAGYL ) IVPB 500 mg        500 mg 100 mL/hr over 60 Minutes Intravenous  Once 09/18/24 1928 09/18/24 2132   09/18/24 1930  vancomycin  (VANCOCIN ) IVPB 1000 mg/200 mL premix  Status:  Discontinued        1,000 mg 200 mL/hr over 60 Minutes Intravenous  Once 09/18/24 1928 09/18/24 1938       Subjective: Patient seen and examined at bedside today.  Hemodynamically stable.  Afebrile this morning.  On dialysis suite being dialyzed.  Remains on room air.  Does not appear to be coughing or short of breath.  Speaks in full sentences.  Oriented to place, tells correct month.  Denies any shortness of breath  Objective: Vitals:   09/19/24 0542 09/19/24 0720 09/19/24 0730 09/19/24 0731  BP:  (!) 125/95 130/73 130/73  Pulse:  (!) 32    Resp:  (!) 23  (!) 22  Temp: (!) 100.7 F (38.2 C) 99.1 F (37.3 C)    TempSrc: Oral Oral    SpO2:  95% 98%   Weight:      Height:       No intake or output data in the 24 hours ending 09/19/24 9178 Filed Weights   09/18/24 1926  Weight: 113.9 kg    Examination:  General exam: Overall comfortable, not in distress, chronically deconditioned HEENT: PERRL Respiratory system:  no wheezes or crackles  Cardiovascular system: Irregularly irregular rhythm Gastrointestinal system: Abdomen is nondistended, soft and nontender. Central nervous system: Alert and awake, oriented x 2 Extremities: No edema, no clubbing ,no cyanosis, AV fistula on the left upper  extremity, bilateral BKA Skin: No rashes, no ulcers,no icterus     Data Reviewed: I have personally reviewed following labs and imaging studies  CBC: Recent Labs  Lab 09/18/24 2019 09/19/24 0336 09/19/24 0345  WBC 11.8* 8.6  --   NEUTROABS 9.6* 6.8  --   HGB 11.2* 10.0* 10.9*  HCT 35.7* 30.9* 32.0*  MCV 89.9 88.3  --   PLT 139* 118*  --    Basic Metabolic Panel: Recent Labs  Lab 09/18/24 2019 09/19/24 0336 09/19/24 0345  NA 128* 132* 130*  K 6.3* 5.8* 5.6*  CL 88* 90*  --   CO2 24 21*  --   GLUCOSE 148* 121*  --   BUN 69* 71*  --   CREATININE 7.47* 7.74*  --   CALCIUM  8.7* 8.6*  --   MG  --  2.2  --   PHOS  --  6.8*  --      Recent Results (from the past 240 hours)  Resp panel by RT-PCR (RSV, Flu A&B, Covid) Anterior  Nasal Swab     Status: None   Collection Time: 09/18/24  7:54 PM   Specimen: Anterior Nasal Swab  Result Value Ref Range Status   SARS Coronavirus 2 by RT PCR NEGATIVE NEGATIVE Final   Influenza A by PCR NEGATIVE NEGATIVE Final   Influenza B by PCR NEGATIVE NEGATIVE Final    Comment: (NOTE) The Xpert Xpress SARS-CoV-2/FLU/RSV plus assay is intended as an aid in the diagnosis of influenza from Nasopharyngeal swab specimens and should not be used as a sole basis for treatment. Nasal washings and aspirates are unacceptable for Xpert Xpress SARS-CoV-2/FLU/RSV testing.  Fact Sheet for Patients: bloggercourse.com  Fact Sheet for Healthcare Providers: seriousbroker.it  This test is not yet approved or cleared by the [place]  FDA and has been authorized for detection and/or diagnosis of SARS-CoV-2 by FDA under an Emergency Use Authorization (EUA). This EUA will remain in effect (meaning this test can be used) for the duration of the COVID-19 declaration under Section 564(b)(1) of the Act, 21 U.S.C. section 360bbb-3(b)(1), unless the authorization is terminated or revoked.     Resp Syncytial  Virus by PCR NEGATIVE NEGATIVE Final    Comment: (NOTE) Fact Sheet for Patients: bloggercourse.com  Fact Sheet for Healthcare Providers: seriousbroker.it  This test is not yet approved or cleared by the [place]  FDA and has been authorized for detection and/or diagnosis of SARS-CoV-2 by FDA under an Emergency Use Authorization (EUA). This EUA will remain in effect (meaning this test can be used) for the duration of the COVID-19 declaration under Section 564(b)(1) of the Act, 21 U.S.C. section 360bbb-3(b)(1), unless the authorization is terminated or revoked.  Performed at [facility], [address] [place], [place] [address]      Radiology Studies: DG Chest Port 1 View Result Date: 09/18/2024 EXAM: 1 VIEW(S) XRAY OF THE CHEST 09/18/2024 07:43:00 PM COMPARISON: 08/25/2024 CLINICAL HISTORY: Questionable sepsis - evaluate for abnormality FINDINGS: LINES, TUBES AND DEVICES: Endotracheal and enteric tubes removed. LUNGS AND PLEURA: Stable pulmonary insufflation. New small-to-moderate left pleural effusion with associated left basilar atelectasis and resultant retrocardiac opacification. No pneumothorax. HEART AND MEDIASTINUM: Stable cardiomegaly. Aortic atherosclerotic calcification. BONES AND SOFT TISSUES: Cervical spine fusion hardware noted. No acute osseous abnormality. IMPRESSION: 1. New small-to-moderate left pleural effusion with associated left basilar atelectasis and resultant retrocardiac opacification. 2. Stable cardiomegaly and aortic atherosclerotic calcification. Electronically signed by: Dorethia Molt MD MD 09/18/2024 07:55 PM EST RP Workstation: [id number]    Scheduled Meds:  apixaban   5 mg Oral BID   atorvastatin   80 mg Oral QHS   Chlorhexidine  Gluconate Cloth  6 each Topical Q0600   ferrous sulfate   325 mg Oral Q breakfast   insulin  aspart  0-6 Units Subcutaneous TID WC   sevelamer  carbonate  800 mg Oral TID WC    sodium zirconium cyclosilicate   10 g Oral Daily   Continuous Infusions:  anticoagulant sodium citrate      ceFEPime  (MAXIPIME ) IV     lactated ringers  150 mL/hr at 09/19/24 0321     LOS: 1 day   Ivonne Mustache, MD [facility] P1/06/2025, 8:21 AM  "

## 2024-09-19 NOTE — Procedures (Signed)
 S: pt is lethargic, but able to answer simple questions. Tolerating HD, will ^UF goal given IVF's used in ED  Vitals:   09/19/24 0830 09/19/24 0900 09/19/24 0906 09/19/24 0930  BP: (!) 143/84 (!) 143/84 137/83 (!) 124/52  Pulse: 93 (!) 103 94 97  Resp: (!) 23 (!) 25 (!) 44 18  Temp:      TempSrc:      SpO2: 100% 100% 100% 99%  Weight:      Height:        Recent Labs  Lab 09/18/24 2019 09/19/24 0336 09/19/24 0345  HGB 11.2* 10.0* 10.9*  ALBUMIN  3.8 3.5  --   CALCIUM  8.7* 8.6*  --   PHOS  --  6.8*  --   CREATININE 7.47* 7.74*  --   K 6.3* 5.8* 5.6*    Inpatient medications:  apixaban   5 mg Oral BID   atorvastatin   80 mg Oral QHS   azithromycin   500 mg Oral Daily   Chlorhexidine  Gluconate Cloth  6 each Topical Q0600   ferrous sulfate   325 mg Oral Q breakfast   insulin  aspart  0-6 Units Subcutaneous TID WC   sevelamer  carbonate  800 mg Oral TID WC   sodium zirconium cyclosilicate   10 g Oral Daily    anticoagulant sodium citrate      cefTRIAXone  (ROCEPHIN )  IV     acetaminophen  **OR** acetaminophen , alteplase , alum & mag hydroxide-simeth, anticoagulant sodium citrate , benzonatate , heparin , heparin , lidocaine  (PF), lidocaine -prilocaine , lip balm, melatonin, metoprolol  tartrate, Muscle Rub, nicotine , ondansetron  (ZOFRAN ) IV, pentafluoroprop-tetrafluoroeth, polyethylene glycol  I was present at the procedure, reviewed the HD regimen and made appropriate changes.   Myer Fret MD  [facility] 09/19/2024, 10:13 AM

## 2024-09-19 NOTE — Consult Note (Signed)
 Renal Service Consult Note [facility] Lamar JONETTA Fret, MD  Patient: [name] Date: 09/19/2024 Requesting Physician: Dr. Jillian  Reason for Consult: ESRD pt w/ AMS HPI: The patient is a 69 y.o. year-old w/ PMH as below who presented to ED from SNF yesterday for AMS.  Pt's wife notified staff the SNF that pt's MS was altered, and also he missed his last HD on Thursday. In ED BP 119/75, HR 110, RR 20-28, temp 102.8. K+ 6.3, Na 128, glu 148, bun 69, creat 7.5, alb 3.8.  wBC 11K, Hb 11.  Flu/ covid/ RSV negative. CXR showed new L effusion, no edema this time. INR 2.2. Seen by [facility] MD who noted hx chronic pain on Oxy IR and fentanyl  patches and gabapentin . In ED pt rec'd po lokelma , IV vanc/ cefepime  and LR started at 150 cc/hr. Pt was admitted. We are asked to see for dialysis.    Pt seen in HD unit. He is lethargic but not in any distress and is able to answer simple questions. Denies any current SOB.    ROS - n/a    Past Medical History  Past Medical History:  Diagnosis Date   Carotid artery occlusion 11/10/2010   LEFT CAROTID ENDARTERECTOMY   Complication of anesthesia    BP WENT UP AT [facility]    COPD (chronic obstructive pulmonary disease) (HCC)    pt denies this dx as of 06/01/20 - no inhaler    Diabetes mellitus without complication (HCC)    Diverticulitis    Diverticulosis of colon (without mention of hemorrhage)    DJD (degenerative joint disease)    knees/hands/feet/back/neck   ESRD (end stage renal disease) on dialysis (HCC)    Fatty liver    Full dentures    GERD (gastroesophageal reflux disease)    H/O hiatal hernia    History of blood transfusion    with a past surical procedure per patient 06/01/20   History of right below knee amputation (HCC) 07/12/2016   Hyperlipidemia    Hypertension    Neuromuscular disorder (HCC)    peripheral neuropathy   Non-pressure chronic ulcer of other part of left foot limited to breakdown of skin (HCC) 11/12/2016    Nonrheumatic aortic (valve) stenosis 03/18/2024   Limited TTE 03/18/2024: EF 55-60, moderate LVH, normal RVSF, moderate LAE, mild MR, mild-moderate AS (mean 12, V-max 235 cm/s, DI 0.35), aortic root 39 mm, moderate pleural effusion, trivial pericardial effusion    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 Atherectomy left popliteal artery July 2012.    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   Unstable angina (HCC) 09/16/2018   Wears glasses    Past Surgical History  Past Surgical History:  Procedure Laterality Date   ABDOMINAL AORTOGRAM W/LOWER EXTREMITY N/A 06/23/2021   Procedure: ABDOMINAL AORTOGRAM W/LOWER EXTREMITY;  Surgeon: Elmira Newman PARAS, MD;  Location: [facility];  Service: Cardiovascular;  Laterality: N/A;   AMPUTATION  11/05/2011   Procedure: AMPUTATION RAY;  Surgeon: Norleen Armor, MD;  Location: [facility] OR;  Service: Orthopedics;  Laterality: Right;  Amputation of Right 4&5th Toes   AMPUTATION Left 11/26/2012  Procedure: AMPUTATION RAY;  Surgeon: Norleen Armor, MD;  Location: [facility] OR;  Service: Orthopedics;  Laterality: Left;  fourth ray amputation   AMPUTATION Right 08/27/2014   Procedure: Transmetatarsal Amputation;  Surgeon: Jerona Harden GAILS, MD;  Location: [facility] OR;  Service: Orthopedics;  Laterality: Right;   AMPUTATION Right 01/14/2015   Procedure: AMPUTATION BELOW KNEE;  Surgeon: Jerona Harden GAILS, MD;  Location: [facility] OR;  Service: Orthopedics;  Laterality: Right;   AMPUTATION Left 10/21/2015   Procedure: Left Foot 5th Ray Amputation;  Surgeon: Jerona Harden GAILS, MD;  Location: [facility] OR;  Service: Orthopedics;  Laterality: Left;   AMPUTATION Left 07/04/2022   Procedure: LEFT BELOW KNEE  AMPUTATION;  Surgeon: Harden Jerona GAILS, MD;  Location: [facility] OR;  Service: Orthopedics;  Laterality: Left;   ANTERIOR FUSION CERVICAL SPINE  02/06/2006   C4-5, C5-6, C6-7; SURGEON DR. MAX COHEN   AV FISTULA PLACEMENT Left 06/02/2020   Procedure: ARTERIOVENOUS (AV) FISTULA CREATION LEFT;  Surgeon: Sheree Penne Bruckner, MD;  Location: [facility] OR;  Service: Vascular;  Laterality: Left;   BACK SURGERY     x 3   BASCILIC VEIN TRANSPOSITION Left 07/21/2020   Procedure: LEFT UPPER ARM ATERIOVENOUS SUPERFISTULALIZATION;  Surgeon: Sheree Penne Bruckner, MD;  Location: [facility] OR;  Service: Vascular;  Laterality: Left;   BELOW KNEE LEG AMPUTATION Right    CARDIAC CATHETERIZATION  10/31/2004   2009   CAROTID ENDARTERECTOMY  11/10/2010   CAROTID ENDARTERECTOMY Left 11/10/2010   Subtotal occlusion of left internal carotid artery with left hemispheric transient ischemic attacks.   CAROTID STENT     CARPAL TUNNEL RELEASE Right 10/21/2013   Procedure: RIGHT CARPAL TUNNEL RELEASE;  Surgeon: Arley JONELLE Curia, MD;  Location: [facility];  Service: Orthopedics;  Laterality: Right;   CHOLECYSTECTOMY     COLON SURGERY     COLONOSCOPY     COLOSTOMY REVERSAL  05/21/2018   ileostomy reversal   CORONARY LITHOTRIPSY N/A 11/24/2023   Procedure: CORONARY LITHOTRIPSY;  Surgeon: Jordan, Peter M, MD;  Location: [facility] INVASIVE CV LAB;  Service: Cardiovascular;  Laterality: N/A;   CORONARY STENT INTERVENTION N/A 11/24/2023   Procedure: CORONARY STENT INTERVENTION;  Surgeon: Jordan, Peter M, MD;  Location: [facility] INVASIVE CV LAB;  Service: Cardiovascular;  Laterality: N/A;   CYSTOSCOPY WITH STENT PLACEMENT Bilateral 01/13/2018   Procedure: CYSTOSCOPY WITH BILATERAL URETERAL CATHETER PLACEMENT;  Surgeon: Cam Morene ORN, MD;  Location: [facility] ORS;  Service: Urology;  Laterality: Bilateral;   ESOPHAGEAL MANOMETRY Bilateral 07/19/2014   Procedure: ESOPHAGEAL MANOMETRY (EM);  Surgeon: Gordy CHRISTELLA Starch, MD;  Location: [facility];   Service: Gastroenterology;  Laterality: Bilateral;   EYE SURGERY Bilateral 2020   cataract   FEMORAL ARTERY STENT     x6   FINGER SURGERY     FOOT SURGERY  04/25/2016    EXCISION BASE 5TH METATARSAL AND PARTIAL CUBOID LEFT FOOT   HERNIA REPAIR     LEFT INGUINAL AND UMBILICAL REPAIRS   HERNIA REPAIR     I & D EXTREMITY Left 04/25/2016   Procedure: EXCISION BASE 5TH METATARSAL AND PARTIAL CUBOID LEFT FOOT;  Surgeon: Jerona GAILS Harden, MD;  Location: [facility];  Service: Orthopedics;  Laterality: Left;   ILEOSTOMY  01/13/2018   Procedure: ILEOSTOMY;  Surgeon: Signe Mitzie LABOR, MD;  Location: [facility];  Service: General;;   ILEOSTOMY CLOSURE N/A 05/21/2018   Procedure: ILEOSTOMY REVERSAL ERAS PATHWAY;  Surgeon: Signe Mitzie LABOR, MD;  Location: [facility] OR;  Service: General;  Laterality:  N/A;   IR RADIOLOGIST EVAL & MGMT  11/19/2017   IR RADIOLOGIST EVAL & MGMT  12/03/2017   IR RADIOLOGIST EVAL & MGMT  12/18/2017   JOINT REPLACEMENT Right 2001   Total knee   LAMINECTOMY     X 3 LUMBAR AND X 2 CERVICAL SPINE OPERATIONS   LAPAROSCOPIC CHOLECYSTECTOMY W/ CHOLANGIOGRAPHY  11/09/2004   SURGEON DR. DEWARD CANDIE NULL   LEFT HEART CATH AND CORONARY ANGIOGRAPHY N/A 09/16/2018   Procedure: LEFT HEART CATH AND CORONARY ANGIOGRAPHY;  Surgeon: Elmira Newman PARAS, MD;  [facility];  Service: Cardiovascular;  Laterality: N/A;   LEFT HEART CATH AND CORONARY ANGIOGRAPHY N/A 11/24/2023   Procedure: LEFT HEART CATH AND CORONARY ANGIOGRAPHY;  Surgeon: Jordan, Peter M, MD;  Location: [facility] INVASIVE CV LAB;  Service: Cardiovascular;  Laterality: N/A;   LEFT HEART CATHETERIZATION WITH CORONARY ANGIOGRAM N/A 10/29/2014   Procedure: LEFT HEART CATHETERIZATION WITH CORONARY ANGIOGRAM;  Surgeon: Erick JONELLE Bergamo, MD;  Location: [facility] CATH LAB;  Service: Cardiovascular;  Laterality: N/A;   LIGATION OF COMPETING BRANCHES OF ARTERIOVENOUS FISTULA Left 07/21/2020   Procedure: LIGATION OF COMPETING BRANCHES OF LEFT UPPER  ARM ARTERIOVENOUS FISTULA;  Surgeon: Sheree Penne Bruckner, MD;  Location: [facility] OR;  Service: Vascular;  Laterality: Left;   LOWER EXTREMITY ANGIOGRAM N/A 03/19/2012   Procedure: LOWER EXTREMITY ANGIOGRAM;  Surgeon: Bruckner JONETTA Cash, MD;  Location: [facility] CATH LAB;  Service: Cardiovascular;  Laterality: N/A;   LOWER EXTREMITY ANGIOGRAPHY N/A 06/20/2021   Procedure: LOWER EXTREMITY ANGIOGRAPHY;  Surgeon: Elmira Newman PARAS, MD;  [facility];  Service: Cardiovascular;  Laterality: N/A;   NECK SURGERY     PARTIAL COLECTOMY N/A 01/13/2018   Procedure: LAPAROSCOPIC ASSISTED   SIGMOID COLECTOMY ILEOSTOMY;  Surgeon: Signe Mitzie LABOR, MD;  Location: [facility];  Service: General;  Laterality: N/A;   PENILE PROSTHESIS IMPLANT  08/14/2005   INFRAPUBIC INSERTION OF INFLATABLE PENILE PROSTHESIS; SURGEON DR. JANIT   PENILE PROSTHESIS IMPLANT     PERCUTANEOUS CORONARY STENT INTERVENTION (PCI-S) Right 10/29/2014   Procedure: PERCUTANEOUS CORONARY STENT INTERVENTION (PCI-S);  Surgeon: Erick JONELLE Bergamo, MD;  Location: [facility] CATH LAB;  Service: Cardiovascular;  Laterality: Right;   PERICARDIOCENTESIS N/A 02/13/2024   Procedure: PERICARDIOCENTESIS;  Surgeon: Elmira Newman PARAS, MD;  [facility];  Service: Cardiovascular;  Laterality: N/A;   PERIPHERAL VASCULAR INTERVENTION Left 06/23/2021   Procedure: PERIPHERAL VASCULAR INTERVENTION;  Surgeon: Elmira Newman PARAS, MD;  [facility];  Service: Cardiovascular;  Laterality: Left;   REMOVAL OF PENILE PROSTHESIS N/A 06/14/2021   Procedure: Removal of THREE piece inflatable penile prosthesis;  Surgeon: Carolee Sherwood JONETTA DOUGLAS, MD;  Location: [facility] OR;  Service: Urology;  Laterality: N/A;   SHOULDER ARTHROSCOPY     SPINE SURGERY     TEMPORARY PACEMAKER N/A 02/13/2024   Procedure: TEMPORARY PACEMAKER;  Surgeon: Elmira Newman PARAS, MD;  Location: [facility];  Service: Cardiovascular;  Laterality: N/A;   TOE  AMPUTATION Left    TONSILLECTOMY     TOTAL KNEE ARTHROPLASTY  07/2002   RIGHT KNEE ; SURGEON  DR. [name] ALSO HAD ARTHROSCOPIC RIGHT KNEE IN  10/2001   TOTAL KNEE ARTHROPLASTY     ULNAR NERVE TRANSPOSITION Right 10/21/2013   Procedure: RIGHT ELBOW  ULNAR NERVE DECOMPRESSION;  Surgeon: Arley JONELLE Curia, MD;  Location: [facility];  Service: Orthopedics;  Laterality: Right;   Family History  Family History  Problem Relation Age  of Onset   Heart disease Father        Before age 76-  CAD, BPG   Diabetes Father        Amputation   Cancer Father        PROSTATE   Hyperlipidemia Father    Hypertension Father    Heart attack Father        Triple BPG   Varicose Veins Father    Cancer Sister        Breast   Hyperlipidemia Sister    Hypertension Sister    Heart attack Brother    Colon cancer Brother    Diabetes Brother    Heart disease Brother 79       A-Fib. Before age 86   Hyperlipidemia Brother    Hypertension Brother    Hypertension Son    Arthritis Other        GRANDMOTHER   Hypertension Other        OTHER FAMILY MEMBERS   Social History  reports that he has been smoking cigarettes. He has a 35 pack-year smoking history. He has never used smokeless tobacco. He reports that he does not currently use alcohol . He reports that he does not use drugs. Allergies Allergies[1] Home medications Prior to Admission medications  Medication Sig Start Date End Date Taking? Authorizing Provider  apixaban  (ELIQUIS ) 5 MG TABS tablet Take 1 tablet (5 mg total) by mouth 2 (two) times daily. 01/14/22 05/10/25 Yes Gonfa, Taye T, MD  atorvastatin  (LIPITOR ) 80 MG tablet Take 1 tablet (80 mg total) by mouth at bedtime. 11/25/23  Yes Amoako, Prince, MD  barrier cream (NON-SPECIFIED) CREA Apply 1 Application topically 2 (two) times daily as needed (incontinence). Apply a thin layer topically every day and night shift for protection   Yes [provider]  carvedilol  (COREG ) 6.25 MG tablet  Take 1 tablet (6.25 mg total) by mouth 2 (two) times daily with a meal. 09/02/24  Yes Samtani, Jai-Gurmukh, MD  colchicine  0.6 MG tablet Take 0.5 tablets (0.3 mg total) by mouth daily. Patient taking differently: Take 0.3 mg by mouth at bedtime. 02/21/24  Yes Gonfa, Taye T, MD  ethyl chloride spray Apply 1 Application topically daily. 09/14/24  Yes [provider]  famotidine  (PEPCID ) 20 MG tablet Take 20 mg by mouth See admin instructions. Give 1 tablet (20mg ) by mouth every other morning.   Yes [provider]  fentaNYL  (DURAGESIC ) 12 MCG/HR Place 1 patch onto the skin every 3 (three) days. 09/04/24  Yes Samtani, Jai-Gurmukh, MD  ferrous sulfate  325 (65 FE) MG tablet Take 325 mg by mouth in the morning.   Yes [provider]  folic acid  (FOLVITE ) 1 MG tablet Take 1 mg by mouth at bedtime. 09/18/21  Yes [provider]  folic acid -vitamin b complex-vitamin c -selenium-zinc  (DIALYVITE ) 3 MG TABS tablet Take 1 tablet by mouth at bedtime.   Yes [provider]  gabapentin  (NEURONTIN ) 100 MG capsule Take 100 mg by mouth 3 (three) times daily.   Yes [provider]  insulin  aspart (NOVOLOG  FLEXPEN) 100 UNIT/ML FlexPen Inject 4-16 Units into the skin See admin instructions. Inject 4 units subcutaneously three times daily before meals, in ADDITION to 0-12 units per sliding scale: 70 - 150 : 0 units 151-200 : 2 units 201-250 : 4 units 251-300 : 6 units 301-350 : 8 units 351-400 : 10 units 401-450 : 12 units > 400 : give 12 units and recheck in 1 hour - if  still > 400, call MD/NP for further orders.   Yes [provider]  oxyCODONE  (OXY IR/ROXICODONE ) 5 MG immediate release tablet Take 1 tablet (5 mg total) by mouth every 6 (six) hours as needed for moderate pain (pain score 4-6) or severe pain (pain score 7-10). 09/02/24  Yes Samtani, Jai-Gurmukh, MD  Phenylephrine  HCl (PREPARATION H) 0.25 % SUPP Place 1 suppository rectally every 6 (six) hours as  needed (hemorrhoids).   Yes [provider]  polyethylene glycol (MIRALAX  / GLYCOLAX ) 17 g packet Take 17 g by mouth daily as needed for moderate constipation. 09/02/24  Yes Samtani, Jai-Gurmukh, MD  rOPINIRole  (REQUIP ) 2 MG tablet Take 2 mg by mouth at bedtime.   Yes [provider]  senna (SENOKOT) 8.6 MG TABS tablet Take 17.2 mg by mouth at bedtime.   Yes [provider]  sevelamer  carbonate (RENVELA ) 800 MG tablet Take 1 tablet (800 mg total) by mouth 3 (three) times daily with meals. 09/02/24  Yes Samtani, Jai-Gurmukh, MD  tamsulosin  (FLOMAX ) 0.4 MG CAPS capsule Take 0.4 mg by mouth at bedtime.   Yes [provider]  Wound Cleansers (VASHE WOUND) SOLN Apply 1 Application topically daily. Apply to left middle finger topically evert day shift for cleansing, and pat dry   Yes [provider]     Vitals:   09/19/24 0830 09/19/24 0900 09/19/24 0906 09/19/24 0930  BP: (!) 143/84 (!) 143/84 137/83 (!) 124/52  Pulse: 93 (!) 103 94 97  Resp: (!) 23 (!) 25 (!) 44 18  Temp:      TempSrc:      SpO2: 100% 100% 100% 99%  Weight:      Height:       Exam Gen alert, no distress, 99% on RA A bit lethargic but w/ eyes open and answering simple questions Sclera anicteric, throat clear  No jvd or bruits Chest clear R side, dec'd L base RRR no MRG Abd soft ntnd no mass or ascites +bs Ext bilat LE BKA w/ trace-1+ edema, no other edema Neuro is as above    LUA AVF+bruit   Home bp meds: Coreg     [facility] HD: [facility]   B450   106kg  2K bath  AVF   Hep none Last OP HD 1/06, post wt 106kg Missed HD 1/08     Assessment/ Plan:  # ESRD - on HD TTS, missed HD 1/08 - on HD this morning in HD unit   # HTN - BP's labile and sometimes soft - home coreg  currently on hold   # Volume - has mild LE edema which is chronic and not as bad as usual  - getting to dry wt at OP HD unit - CXR w/o edema /congestion -  no sig vol overload   # Anemia  of esrd - Hb 10-12 here, follow  # altered mental status - able to converse slightly this am - d/w pmd -> will dc IV cefepime  as can contribute to AMS in esrd pts  # fever/ suspected sepsis - temp 103 in ed - w/u per pmd         Myer Fret  MD [facility] 09/19/2024, 9:57 AM  Recent Labs  Lab 09/18/24 2019 09/19/24 0336 09/19/24 0345  HGB 11.2* 10.0* 10.9*  ALBUMIN  3.8 3.5  --   CALCIUM  8.7* 8.6*  --   PHOS  --  6.8*  --   CREATININE 7.47* 7.74*  --   K 6.3* 5.8*  5.6*   Inpatient medications:  apixaban   5 mg Oral BID   atorvastatin   80 mg Oral QHS   Chlorhexidine  Gluconate Cloth  6 each Topical Q0600   ferrous sulfate   325 mg Oral Q breakfast   insulin  aspart  0-6 Units Subcutaneous TID WC   sevelamer  carbonate  800 mg Oral TID WC   sodium zirconium cyclosilicate   10 g Oral Daily    anticoagulant sodium citrate      ceFEPime  (MAXIPIME ) IV     acetaminophen  **OR** acetaminophen , alteplase , alum & mag hydroxide-simeth, anticoagulant sodium citrate , benzonatate , heparin , heparin , lidocaine  (PF), lidocaine -prilocaine , lip balm, melatonin, metoprolol  tartrate, Muscle Rub, nicotine , ondansetron  (ZOFRAN ) IV, pentafluoroprop-tetrafluoroeth, polyethylene glycol      [1]  Allergies Allergen Reactions   Contrast Media [Iodinated Contrast Media] Shortness Of Breath and Other (See Comments)    Altered mental status     Ivp Dye [Iodinated Contrast Media] Anaphylaxis, Shortness Of Breath and Other (See Comments)    Altered mental status     Latex Rash    severe rash appears after the first 24 hours of being placed   Tape Rash and Other (See Comments)    Rash after 1 day of use  Do not leave on longer then 3 days without changing

## 2024-09-19 NOTE — Progress Notes (Signed)
 LR fluids  stopped per provider

## 2024-09-19 NOTE — ED Notes (Signed)
 The pt is c/o of his back hurting he is still confused

## 2024-09-20 DIAGNOSIS — G9341 Metabolic encephalopathy: Secondary | ICD-10-CM | POA: Diagnosis not present

## 2024-09-20 LAB — BASIC METABOLIC PANEL WITH GFR
Anion gap: 18 — ABNORMAL HIGH (ref 5–15)
BUN: 51 mg/dL — ABNORMAL HIGH (ref 8–23)
CO2: 25 mmol/L (ref 22–32)
Calcium: 8.7 mg/dL — ABNORMAL LOW (ref 8.9–10.3)
Chloride: 92 mmol/L — ABNORMAL LOW (ref 98–111)
Creatinine, Ser: 6.39 mg/dL — ABNORMAL HIGH (ref 0.61–1.24)
GFR, Estimated: 9 mL/min — ABNORMAL LOW
Glucose, Bld: 108 mg/dL — ABNORMAL HIGH (ref 70–99)
Potassium: 4.4 mmol/L (ref 3.5–5.1)
Sodium: 135 mmol/L (ref 135–145)

## 2024-09-20 LAB — HEPATITIS B SURFACE ANTIBODY, QUANTITATIVE: Hep B S AB Quant (Post): 56143 m[IU]/mL

## 2024-09-20 LAB — GLUCOSE, CAPILLARY
Glucose-Capillary: 108 mg/dL — ABNORMAL HIGH (ref 70–99)
Glucose-Capillary: 187 mg/dL — ABNORMAL HIGH (ref 70–99)
Glucose-Capillary: 160 mg/dL — ABNORMAL HIGH (ref 70–99)
Glucose-Capillary: 189 mg/dL — ABNORMAL HIGH (ref 70–99)

## 2024-09-20 MED ORDER — COLLAGENASE 250 UNIT/GM EX OINT
TOPICAL_OINTMENT | Freq: Every day | CUTANEOUS | Status: DC
  Filled 2024-09-20: qty 30

## 2024-09-20 MED ORDER — OXYCODONE HCL 5 MG PO TABS
5.0000 mg | ORAL_TABLET | Freq: Four times a day (QID) | ORAL | Status: DC | PRN
  Administered 2024-09-20 – 2024-09-24 (×5): 5 mg via ORAL
  Filled 2024-09-20 (×6): qty 1

## 2024-09-20 NOTE — Progress Notes (Signed)
 Patient ID: [name], male   DOB: April 23, 1956, 69 y.o.   MRN: [id number] Wife at bedside and tearful. Very adamant about wanting patient to be a full code. Demonstrates understanding of what that entails. Wife wishes for chest compressions, medications and intubation if patient's heart should stop. All life saving measures to be performed. MD made aware and order for code status change obtained.  Verdie JONETTA Collier, RN

## 2024-09-20 NOTE — Progress Notes (Signed)
 " PROGRESS NOTE  [name]  FMW:[id number] DOB: 04-20-1956 DOA: 09/18/2024 PCP: Montey Lot, PA-C   Brief Narrative: Patient is a 69 year old male with history of bilateral BKA, chronic A-fib on Eliquis , ESRD on dialysis on TTS schedule, insulin -dependent diabetes type 2, hypertension, moderate aortic stenosis, chronic pain syndrome, chronic right-sided systolic heart failure who presented with complaint of confusion, fever.  On presentation he was febrile, maintaining saturation on room air.  Lab work showed sodium of 128, potassium 6.3, lactate of 1.4, WBC count of 11.8.  COVID/flu/RSV negative.  EKG showed atrial fibrillation.  Chest x-ray showed left lower lobe airspace opacities concerning for pneumonia, new small-moderate left pleural effusion.  Nephrology consulted for dialysis.  Currently being managed for  pneumonia.  Blood cultures now showing Proteus.  Sepsis present improved.  Assessment & Plan:  Principal Problem:   Acute metabolic encephalopathy Active Problems:   Atrial fibrillation, chronic (HCC)   History of anemia due to chronic kidney disease   End-stage renal disease on hemodialysis (HCC)   Severe sepsis (HCC)   DM2 (diabetes mellitus, type 2) (HCC)   HLD (hyperlipidemia)   Acute hyponatremia   Tobacco abuse   Hyperkalemia   Moderate aortic stenosis   HCAP (healthcare-associated pneumonia)   Fever   Acute metabolic encephalopathy: Usually alert and oriented.  Presented with confusion, fever.  Takes fentanyl  patch, oxycodone , gabapentin .  Polypharmacy could also be possible.  MRI done on 12/18 showed chronic encephalomalacia changes in the right parietal lobe and to a lesser extent the right frontal lobe.Moderately advanced cerebral white matter disease bilaterally. TSH, vitamin B12 optimal.  Mildly elevated ammonia level of 41, we do not think this is contributing to his confusion. Confusion likely from metabolic encephalopathy from sepsis secondary to  pneumonia/bacteremia. This morning he is alert and oriented.  Sepsis secondary to pneumonia/HCAP/Proteus bacteremia: Presented with fever.  Chest x-ray showed left lower lobe opacity concerning for pneumonia.  Initial lactate was normal.  Continue current antibiotics.  Blood cultures showing Proteus on 1 set.  Continue ceftriaxone    ESRD on dialysis/hyperkalemia: Nephrology following for dialysis.  Has AV fistula on the left upper extremity  Acute on chronic hypoosmolar hyponatremia: Volume management per dialysis.  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  The finger is black.  I have sent message to Dr. Genelle, hand surgery for consultation.  Patient's wife request amputation  Chronic A-fib: Monitor on telemetry.  On Eliquis .  Takes Coreg  for rate control.  Type 2 diabetes: Continue current insulin  regimen.  Monitor blood sugars.  Recent A1c of 6.9.  Hyperlipidemia: On Lipitor   History of moderate aortic stenosis: Patient echo showed EF of 50 to 55%, moderate aortic stenosis.  We recommend to follow-up with cardiology as an outpatient  Anemia chronic disease: Currently hemoglobin stable  Chronic tobacco use: Continues to smoke.  Counseled cessation.  Continue current patch  Debility/deconditioning: Patient residing at [facility].  Patient has bilateral BKA     Wound 08/25/24 2047 Pressure Injury Sacrum Stage 2 -  Partial thickness loss of dermis presenting as a shallow open injury with a red, pink wound bed without slough. (Active)     Wound 09/19/24 1735 Pressure Injury Thigh Left;Posterior Stage 1 -  Intact skin with non-blanchable redness of a localized area usually over a bony prominence. (Active)    DVT prophylaxis: apixaban  (ELIQUIS ) tablet 5 mg     Code Status: Do not attempt resuscitation (DNR) PRE-ARREST INTERVENTIONS DESIRED  Family Communication: None at the bedside  Patient status: Inpatient  Patient is from :  SNF  Anticipated discharge to: SNF  Estimated DC date: 1 to 2 days   Consultants: Nephrology  Procedures: Dialysis  Antimicrobials:  Anti-infectives (From admission, onward)    Start     Dose/Rate Route Frequency Ordered Stop   09/19/24 2200  ceFEPIme  (MAXIPIME ) 1 g in sodium chloride  0.9 % 100 mL IVPB  Status:  Discontinued        1 g 200 mL/hr over 30 Minutes Intravenous Every 24 hours 09/18/24 2230 09/19/24 1005   09/19/24 2000  cefTRIAXone  (ROCEPHIN ) 2 g in sodium chloride  0.9 % 100 mL IVPB        2 g 200 mL/hr over 30 Minutes Intravenous Every 24 hours 09/19/24 1005     09/19/24 1200  azithromycin  (ZITHROMAX ) tablet 500 mg        500 mg Oral Daily 09/19/24 1005 09/21/24 2359   09/18/24 1945  vancomycin  (VANCOREADY) IVPB 2000 mg/400 mL        2,000 mg 200 mL/hr over 120 Minutes Intravenous  Once 09/18/24 1938 09/18/24 2334   09/18/24 1930  ceFEPIme  (MAXIPIME ) 2 g in sodium chloride  0.9 % 100 mL IVPB        2 g 200 mL/hr over 30 Minutes Intravenous  Once 09/18/24 1928 09/18/24 2052   09/18/24 1930  metroNIDAZOLE  (FLAGYL ) IVPB 500 mg        500 mg 100 mL/hr over 60 Minutes Intravenous  Once 09/18/24 1928 09/18/24 2132   09/18/24 1930  vancomycin  (VANCOCIN ) IVPB 1000 mg/200 mL premix  Status:  Discontinued        1,000 mg 200 mL/hr over 60 Minutes Intravenous  Once 09/18/24 1928 09/18/24 1938       Subjective: Patient seen and examined at bedside today.  Hemodynamically stable.  Very comfortable this morning.  Lying in bed.  Afebrile.  Fully alert and oriented.  Has dry gangrene of the left hand finger.  No complaint of pain.  Objective: Vitals:   09/19/24 2000 09/19/24 2323 09/20/24 0211 09/20/24 0418  BP: (!) 97/42 (!) 93/55 130/72 (!) 101/56  Pulse: 85 (!) 49 64 86  Resp: 17 15 (!) 24 (!) 22  Temp: 99.7 F (37.6 C)   (!) 97.5 F (36.4 C)  TempSrc: Oral   Oral  SpO2: 94% (!) 87% 96% 97%  Weight:    108.7 kg  Height:        Intake/Output Summary (Last 24  hours) at 09/20/2024 1109 Last data filed at 09/20/2024 0215 Gross per 24 hour  Intake 220 ml  Output --  Net 220 ml   Filed Weights   09/19/24 0720 09/19/24 1701 09/20/24 0418  Weight: 113.9 kg 111.9 kg 108.7 kg    Examination:     General exam: Overall comfortable, not in distress, chronically deconditioned HEENT: PERRL Respiratory system:  no wheezes or crackles  Cardiovascular system: Irregular irregular rhythm Gastrointestinal system: Abdomen is nondistended, soft and nontender. Central nervous system: Alert and oriented Extremities: No edema, no clubbing ,no cyanosis, AV fistula in the left upper extremity, bilateral BKA, dry gangrene of the left fourth finger Skin: No rashes, no ulcers,no icterus     Data Reviewed: I have personally reviewed following labs and imaging studies  CBC: Recent Labs  Lab 09/18/24 2019 09/19/24 0336 09/19/24 0345  WBC 11.8* 8.6  --   NEUTROABS 9.6* 6.8  --   HGB 11.2* 10.0* 10.9*  HCT 35.7* 30.9* 32.0*  MCV 89.9 88.3  --   PLT 139* 118*  --    Basic Metabolic Panel: Recent Labs  Lab 09/18/24 2019 09/19/24 0336 09/19/24 0345 09/19/24 2040 09/20/24 0247  NA 128* 132* 130*  --  135  K 6.3* 5.8* 5.6*  --  4.4  CL 88* 90*  --   --  92*  CO2 24 21*  --   --  25  GLUCOSE 148* 121*  --   --  108*  BUN 69* 71*  --   --  51*  CREATININE 7.47* 7.74*  --   --  6.39*  CALCIUM  8.7* 8.6*  --   --  8.7*  MG  --  2.2  --  2.0  --   PHOS  --  6.8*  --   --   --      Recent Results (from the past 240 hours)  Resp panel by RT-PCR (RSV, Flu A&B, Covid) Anterior Nasal Swab     Status: None   Collection Time: 09/18/24  7:54 PM   Specimen: Anterior Nasal Swab  Result Value Ref Range Status   SARS Coronavirus 2 by RT PCR NEGATIVE NEGATIVE Final   Influenza A by PCR NEGATIVE NEGATIVE Final   Influenza B by PCR NEGATIVE NEGATIVE Final    Comment: (NOTE) The Xpert Xpress SARS-CoV-2/FLU/RSV plus assay is intended as an aid in the diagnosis of  influenza from Nasopharyngeal swab specimens and should not be used as a sole basis for treatment. Nasal washings and aspirates are unacceptable for Xpert Xpress SARS-CoV-2/FLU/RSV testing.  Fact Sheet for Patients: bloggercourse.com  Fact Sheet for Healthcare Providers: seriousbroker.it  This test is not yet approved or cleared by the [place]  FDA and has been authorized for detection and/or diagnosis of SARS-CoV-2 by FDA under an Emergency Use Authorization (EUA). This EUA will remain in effect (meaning this test can be used) for the duration of the COVID-19 declaration under Section 564(b)(1) of the Act, 21 U.S.C. section 360bbb-3(b)(1), unless the authorization is terminated or revoked.     Resp Syncytial Virus by PCR NEGATIVE NEGATIVE Final    Comment: (NOTE) Fact Sheet for Patients: bloggercourse.com  Fact Sheet for Healthcare Providers: seriousbroker.it  This test is not yet approved or cleared by the [place]  FDA and has been authorized for detection and/or diagnosis of SARS-CoV-2 by FDA under an Emergency Use Authorization (EUA). This EUA will remain in effect (meaning this test can be used) for the duration of the COVID-19 declaration under Section 564(b)(1) of the Act, 21 U.S.C. section 360bbb-3(b)(1), unless the authorization is terminated or revoked.  Performed at [facility], [address] [place], [place] [address]   Blood Culture (routine x 2)     Status: Abnormal (Preliminary result)   Collection Time: 09/18/24  8:19 PM   Specimen: BLOOD RIGHT ARM  Result Value Ref Range Status   Specimen Description BLOOD RIGHT ARM  Final   Special Requests   Final    BOTTLES DRAWN AEROBIC AND ANAEROBIC Blood Culture adequate volume   Culture  Setup Time   Final    GRAM NEGATIVE RODS AEROBIC BOTTLE ONLY CRITICAL RESULT CALLED TO, READ BACK BY AND  VERIFIED WITH: PHARMD J LEDFORD 09/19/2024 @ 2351 BY AB    Culture (A)  Final    PROTEUS SPECIES CULTURE REINCUBATED FOR BETTER GROWTH Performed at [facility], [address] [place], [place] [address]  Report Status PENDING  Incomplete  Blood Culture ID Panel (Reflexed)     Status: Abnormal   Collection Time: 09/18/24  8:19 PM  Result Value Ref Range Status   Enterococcus faecalis NOT DETECTED NOT DETECTED Final   Enterococcus Faecium NOT DETECTED NOT DETECTED Final   Listeria monocytogenes NOT DETECTED NOT DETECTED Final   Staphylococcus species NOT DETECTED NOT DETECTED Final   Staphylococcus aureus (BCID) NOT DETECTED NOT DETECTED Final   Staphylococcus epidermidis NOT DETECTED NOT DETECTED Final   Staphylococcus lugdunensis NOT DETECTED NOT DETECTED Final   Streptococcus species NOT DETECTED NOT DETECTED Final   Streptococcus agalactiae NOT DETECTED NOT DETECTED Final   Streptococcus pneumoniae NOT DETECTED NOT DETECTED Final   Streptococcus pyogenes NOT DETECTED NOT DETECTED Final   A.calcoaceticus-baumannii NOT DETECTED NOT DETECTED Final   Bacteroides fragilis NOT DETECTED NOT DETECTED Final   Enterobacterales DETECTED (A) NOT DETECTED Final    Comment: Enterobacterales represent a large order of gram negative bacteria, not a single organism. CRITICAL RESULT CALLED TO, READ BACK BY AND VERIFIED WITH: PHARMD J LEDFORD 09/19/2024 @ 2351 BY AB    Enterobacter cloacae complex NOT DETECTED NOT DETECTED Final   Escherichia coli NOT DETECTED NOT DETECTED Final   Klebsiella aerogenes NOT DETECTED NOT DETECTED Final   Klebsiella oxytoca NOT DETECTED NOT DETECTED Final   Klebsiella pneumoniae NOT DETECTED NOT DETECTED Final   Proteus species DETECTED (A) NOT DETECTED Final    Comment: CRITICAL RESULT CALLED TO, READ BACK BY AND VERIFIED WITH: PHARMD J LEDFORD 09/19/2024 @ 2351 BY AB    Salmonella species NOT DETECTED NOT DETECTED Final   Serratia marcescens NOT DETECTED  NOT DETECTED Final   Haemophilus influenzae NOT DETECTED NOT DETECTED Final   Neisseria meningitidis NOT DETECTED NOT DETECTED Final   Pseudomonas aeruginosa NOT DETECTED NOT DETECTED Final   Stenotrophomonas maltophilia NOT DETECTED NOT DETECTED Final   Candida albicans NOT DETECTED NOT DETECTED Final   Candida auris NOT DETECTED NOT DETECTED Final   Candida glabrata NOT DETECTED NOT DETECTED Final   Candida krusei NOT DETECTED NOT DETECTED Final   Candida parapsilosis NOT DETECTED NOT DETECTED Final   Candida tropicalis NOT DETECTED NOT DETECTED Final   Cryptococcus neoformans/gattii NOT DETECTED NOT DETECTED Final   CTX-M ESBL NOT DETECTED NOT DETECTED Final   Carbapenem resistance IMP NOT DETECTED NOT DETECTED Final   Carbapenem resistance KPC NOT DETECTED NOT DETECTED Final   Carbapenem resistance NDM NOT DETECTED NOT DETECTED Final   Carbapenem resist OXA 48 LIKE NOT DETECTED NOT DETECTED Final   Carbapenem resistance VIM NOT DETECTED NOT DETECTED Final    Comment: Performed at [facility], [address], [place], [place] [address]     Radiology Studies: DG Chest Port 1 View Result Date: 09/18/2024 EXAM: 1 VIEW(S) XRAY OF THE CHEST 09/18/2024 07:43:00 PM COMPARISON: 08/25/2024 CLINICAL HISTORY: Questionable sepsis - evaluate for abnormality FINDINGS: LINES, TUBES AND DEVICES: Endotracheal and enteric tubes removed. LUNGS AND PLEURA: Stable pulmonary insufflation. New small-to-moderate left pleural effusion with associated left basilar atelectasis and resultant retrocardiac opacification. No pneumothorax. HEART AND MEDIASTINUM: Stable cardiomegaly. Aortic atherosclerotic calcification. BONES AND SOFT TISSUES: Cervical spine fusion hardware noted. No acute osseous abnormality. IMPRESSION: 1. New small-to-moderate left pleural effusion with associated left basilar atelectasis and resultant retrocardiac opacification. 2. Stable cardiomegaly and aortic atherosclerotic calcification.  Electronically signed by: Dorethia Molt MD MD 09/18/2024 07:55 PM EST RP Workstation: [id number]    Scheduled Meds:  apixaban   5  mg Oral BID   atorvastatin   80 mg Oral QHS   azithromycin   500 mg Oral Q1200   Chlorhexidine  Gluconate Cloth  6 each Topical Q0600   ferrous sulfate   325 mg Oral Q breakfast   insulin  aspart  0-6 Units Subcutaneous TID WC   sevelamer  carbonate  800 mg Oral TID WC   sodium zirconium cyclosilicate   10 g Oral Daily   Continuous Infusions:  cefTRIAXone  (ROCEPHIN )  IV 2 g (09/19/24 2102)     LOS: 2 days   Ivonne Mustache, MD [facility] P1/07/2025, 11:09 AM  "

## 2024-09-20 NOTE — Progress Notes (Signed)
 " [facility] Progress Note   Subjective:   Patient seen and examined at bedside.  Feeling better today.  Oriented x3.  Denies chest pain, shortness of breath, cough, fever, chills, weakness, fatigue and n/v/d/  Tolerated dialysis well yesterday.  Objective Vitals:   09/19/24 2323 09/20/24 0211 09/20/24 0418 09/20/24 1148  BP: (!) 93/55 130/72 (!) 101/56 (!) 117/52  Pulse: (!) 49 64 86 86  Resp: 15 (!) 24 (!) 22 13  Temp:   (!) 97.5 F (36.4 C) 99 F (37.2 C)  TempSrc:   Oral Oral  SpO2: (!) 87% 96% 97% 97%  Weight:   108.7 kg   Height:       Physical Exam General:chronically ill appearing male in NAD Heart:RRR Lungs:+rhonchi, nml WOb on RA Abdomen:soft, mildly tender, non distended Extremities:trace LE edema, b/l BKA Dialysis Access: LU AVF +b/t   Filed Weights   09/19/24 0720 09/19/24 1701 09/20/24 0418  Weight: 113.9 kg 111.9 kg 108.7 kg    Intake/Output Summary (Last 24 hours) at 09/20/2024 1304 Last data filed at 09/20/2024 0215 Gross per 24 hour  Intake 220 ml  Output --  Net 220 ml    Additional Objective Labs: Basic Metabolic Panel: Recent Labs  Lab 09/18/24 2019 09/19/24 0336 09/19/24 0345 09/20/24 0247  NA 128* 132* 130* 135  K 6.3* 5.8* 5.6* 4.4  CL 88* 90*  --  92*  CO2 24 21*  --  25  GLUCOSE 148* 121*  --  108*  BUN 69* 71*  --  51*  CREATININE 7.47* 7.74*  --  6.39*  CALCIUM  8.7* 8.6*  --  8.7*  PHOS  --  6.8*  --   --    Liver Function Tests: Recent Labs  Lab 09/18/24 2019 09/19/24 0336  AST 21 19  ALT 15 14  ALKPHOS 157* 135*  BILITOT 0.7 0.7  PROT 6.4* 5.9*  ALBUMIN  3.8 3.5   CBC: Recent Labs  Lab 09/18/24 2019 09/19/24 0336 09/19/24 0345  WBC 11.8* 8.6  --   NEUTROABS 9.6* 6.8  --   HGB 11.2* 10.0* 10.9*  HCT 35.7* 30.9* 32.0*  MCV 89.9 88.3  --   PLT 139* 118*  --    Blood Culture    Component Value Date/Time   SDES BLOOD RIGHT ARM 09/18/2024 2019   SPECREQUEST  09/18/2024 2019    BOTTLES DRAWN  AEROBIC AND ANAEROBIC Blood Culture adequate volume   CULT (A) 09/18/2024 2019    PROTEUS MIRABILIS CULTURE REINCUBATED FOR BETTER GROWTH Performed at [facility], [address] [place], [place] [address]    REPTSTATUS PENDING 09/18/2024 2019    CBG: Recent Labs  Lab 09/19/24 1215 09/19/24 1800 09/19/24 2106 09/20/24 0543 09/20/24 1145  GLUCAP 94 130* 104* 108* 187*    Studies/Results: DG Chest Port 1 View Result Date: 09/18/2024 EXAM: 1 VIEW(S) XRAY OF THE CHEST 09/18/2024 07:43:00 PM COMPARISON: 08/25/2024 CLINICAL HISTORY: Questionable sepsis - evaluate for abnormality FINDINGS: LINES, TUBES AND DEVICES: Endotracheal and enteric tubes removed. LUNGS AND PLEURA: Stable pulmonary insufflation. New small-to-moderate left pleural effusion with associated left basilar atelectasis and resultant retrocardiac opacification. No pneumothorax. HEART AND MEDIASTINUM: Stable cardiomegaly. Aortic atherosclerotic calcification. BONES AND SOFT TISSUES: Cervical spine fusion hardware noted. No acute osseous abnormality. IMPRESSION: 1. New small-to-moderate left pleural effusion with associated left basilar atelectasis and resultant retrocardiac opacification. 2. Stable cardiomegaly and aortic atherosclerotic calcification. Electronically signed by: Dorethia Molt MD MD 09/18/2024 07:55 PM EST  RP Workstation: [id number]    Medications:  cefTRIAXone  (ROCEPHIN )  IV 2 g (09/19/24 2102)    apixaban   5 mg Oral BID   atorvastatin   80 mg Oral QHS   azithromycin   500 mg Oral Q1200   Chlorhexidine  Gluconate Cloth  6 each Topical Q0600   ferrous sulfate   325 mg Oral Q breakfast   insulin  aspart  0-6 Units Subcutaneous TID WC   sevelamer  carbonate  800 mg Oral TID WC   sodium zirconium cyclosilicate   10 g Oral Daily    Dialysis Orders:  [facility] 4h   B450   106kg  2K bath  AVF   Hep none Last OP HD 1/06, post wt 106kg Missed HD 1/08  Assessment/Plan: 1. Acute encephalopathy -  resolved.  Likely 2/2 PNA and bacteremia.  2. PNA - in LLL on CXR.  On IV ceftriaxone  and PO azithromycin  3. Bacteremia - blood cultures positive for proteus. On IV ceftriaxone  and PO azithromycin  2. ESRD - on HD TTS. Next HD on 09/22/24. 3. Anemia of CKD- Hgb 10.9. No indication for ESA. 4. Secondary hyperparathyroidism - Ca in goal. Phos elevated. Continue binders. 5. HTN/volume - Blood pressure in goal. Does not appear volume overloaded.  UF as tolerated.  6. Nutrition - Renal diet w/fluid restrictions.  Manuelita Labella, PA-C [facility] 09/20/2024,1:04 PM  LOS: 2 days    "

## 2024-09-20 NOTE — Consult Note (Signed)
 WOC Nurse Consult Note: Reason for Consult: Pressure wounds on buttocks, wound on right hand, wounds on left middle finger knukle and left necrotic ring finger  Extensive history of ESRD, PAD, CAD; severe aortic calcification and DM  Patient is known to WOC nursing from recent prior admissions Wound type: Chronic skin damage bilateral buttock; full thickness; Stage 3; pink; clean Chronic wound right anterior hand; full thickness wound; fibrinous with surrounding yellow/brown non viable tissue Dry gangrene right 4th finger  Full thickness skin loss posterior scrotum; 100% pink Pressure Injury POA: Yes Measurement: see nursing flow sheets Wound bed: see above  Drainage (amount, consistency, odor) see nursing flow sheets Periwound: intact; dry Dressing procedure/placement/frequency: Cleanse buttock and scrotal wounds with saline, pat dry, protect with foam, change every other day and PRN soilage Turn and reposition patient per hospital policy Apply Santyl  to the right hand wound and surrounding non viable tissue, top with dry dressing and secure with foam or kerlix. Change daily  No topical care for finger  Re consult if needed, will not follow at this time. Thanks  Dorothy [employer], RN,CWOCN, CNS, [employer] [phone]

## 2024-09-20 NOTE — Plan of Care (Signed)

## 2024-09-20 NOTE — Plan of Care (Signed)
 Patient ID: [name], male   DOB: 07-Nov-1955, 69 y.o.   MRN: [id number]    Problem: Education: Goal: Ability to describe self-care measures that may prevent or decrease complications (Diabetes Survival Skills Education) will improve Outcome: Progressing Goal: Individualized Educational Video(s) Outcome: Progressing   Problem: Coping: Goal: Ability to adjust to condition or change in health will improve Outcome: Progressing   Problem: Fluid Volume: Goal: Ability to maintain a balanced intake and output will improve Outcome: Progressing   Problem: Health Behavior/Discharge Planning: Goal: Ability to identify and utilize available resources and services will improve Outcome: Progressing Goal: Ability to manage health-related needs will improve Outcome: Progressing   Problem: Metabolic: Goal: Ability to maintain appropriate glucose levels will improve Outcome: Progressing   Problem: Nutritional: Goal: Maintenance of adequate nutrition will improve Outcome: Progressing Goal: Progress toward achieving an optimal weight will improve Outcome: Progressing   Problem: Skin Integrity: Goal: Risk for impaired skin integrity will decrease Outcome: Progressing   Problem: Tissue Perfusion: Goal: Adequacy of tissue perfusion will improve Outcome: Progressing   Problem: Education: Goal: Knowledge of General Education information will improve Description: Including pain rating scale, medication(s)/side effects and non-pharmacologic comfort measures Outcome: Progressing   Problem: Health Behavior/Discharge Planning: Goal: Ability to manage health-related needs will improve Outcome: Progressing   Problem: Clinical Measurements: Goal: Ability to maintain clinical measurements within normal limits will improve Outcome: Progressing Goal: Will remain free from infection Outcome: Progressing Goal: Diagnostic test results will improve Outcome: Progressing Goal: Respiratory  complications will improve Outcome: Progressing Goal: Cardiovascular complication will be avoided Outcome: Progressing   Problem: Activity: Goal: Risk for activity intolerance will decrease Outcome: Progressing   Problem: Nutrition: Goal: Adequate nutrition will be maintained Outcome: Progressing   Problem: Coping: Goal: Level of anxiety will decrease Outcome: Progressing   Problem: Elimination: Goal: Will not experience complications related to bowel motility Outcome: Progressing Goal: Will not experience complications related to urinary retention Outcome: Progressing   Problem: Pain Managment: Goal: General experience of comfort will improve and/or be controlled Outcome: Progressing   Problem: Safety: Goal: Ability to remain free from injury will improve Outcome: Progressing   Problem: Skin Integrity: Goal: Risk for impaired skin integrity will decrease Outcome: Progressing    Verdie JONETTA Collier, RN

## 2024-09-20 NOTE — Progress Notes (Signed)
 PHARMACY - PHYSICIAN COMMUNICATION CRITICAL VALUE ALERT - BLOOD CULTURE IDENTIFICATION (BCID)  [name] is an 69 y.o. male who presented to [facility] on 09/18/2024 with a chief complaint of confusion/fever  Name of physician (or Provider) Contacted: Dr. Charlton  Current antibiotics: IV Ceftriaxone /PO Azithromycin   Changes to prescribed antibiotics recommended:  No changes  Results for orders placed or performed during the hospital encounter of 09/18/24  Blood Culture ID Panel (Reflexed) (Collected: 09/18/2024  8:19 PM)  Result Value Ref Range   Enterococcus faecalis NOT DETECTED NOT DETECTED   Enterococcus Faecium NOT DETECTED NOT DETECTED   Listeria monocytogenes NOT DETECTED NOT DETECTED   Staphylococcus species NOT DETECTED NOT DETECTED   Staphylococcus aureus (BCID) NOT DETECTED NOT DETECTED   Staphylococcus epidermidis NOT DETECTED NOT DETECTED   Staphylococcus lugdunensis NOT DETECTED NOT DETECTED   Streptococcus species NOT DETECTED NOT DETECTED   Streptococcus agalactiae NOT DETECTED NOT DETECTED   Streptococcus pneumoniae NOT DETECTED NOT DETECTED   Streptococcus pyogenes NOT DETECTED NOT DETECTED   A.calcoaceticus-baumannii NOT DETECTED NOT DETECTED   Bacteroides fragilis NOT DETECTED NOT DETECTED   Enterobacterales DETECTED (A) NOT DETECTED   Enterobacter cloacae complex NOT DETECTED NOT DETECTED   Escherichia coli NOT DETECTED NOT DETECTED   Klebsiella aerogenes NOT DETECTED NOT DETECTED   Klebsiella oxytoca NOT DETECTED NOT DETECTED   Klebsiella pneumoniae NOT DETECTED NOT DETECTED   Proteus species DETECTED (A) NOT DETECTED   Salmonella species NOT DETECTED NOT DETECTED   Serratia marcescens NOT DETECTED NOT DETECTED   Haemophilus influenzae NOT DETECTED NOT DETECTED   Neisseria meningitidis NOT DETECTED NOT DETECTED   Pseudomonas aeruginosa NOT DETECTED NOT DETECTED   Stenotrophomonas maltophilia NOT DETECTED NOT DETECTED   Candida albicans NOT DETECTED NOT  DETECTED   Candida auris NOT DETECTED NOT DETECTED   Candida glabrata NOT DETECTED NOT DETECTED   Candida krusei NOT DETECTED NOT DETECTED   Candida parapsilosis NOT DETECTED NOT DETECTED   Candida tropicalis NOT DETECTED NOT DETECTED   Cryptococcus neoformans/gattii NOT DETECTED NOT DETECTED   CTX-M ESBL NOT DETECTED NOT DETECTED   Carbapenem resistance IMP NOT DETECTED NOT DETECTED   Carbapenem resistance KPC NOT DETECTED NOT DETECTED   Carbapenem resistance NDM NOT DETECTED NOT DETECTED   Carbapenem resist OXA 48 LIKE NOT DETECTED NOT DETECTED   Carbapenem resistance VIM NOT DETECTED NOT DETECTED   Lynwood Mckusick, PharmD, BCPS Clinical Pharmacist Phone: [phone]

## 2024-09-21 DIAGNOSIS — G9341 Metabolic encephalopathy: Secondary | ICD-10-CM | POA: Diagnosis not present

## 2024-09-21 DIAGNOSIS — I96 Gangrene, not elsewhere classified: Secondary | ICD-10-CM

## 2024-09-21 LAB — GLUCOSE, CAPILLARY
Glucose-Capillary: 168 mg/dL — ABNORMAL HIGH (ref 70–99)
Glucose-Capillary: 183 mg/dL — ABNORMAL HIGH (ref 70–99)
Glucose-Capillary: 180 mg/dL — ABNORMAL HIGH (ref 70–99)
Glucose-Capillary: 222 mg/dL — ABNORMAL HIGH (ref 70–99)
Glucose-Capillary: 199 mg/dL — ABNORMAL HIGH (ref 70–99)

## 2024-09-21 LAB — CBC
HCT: 30.1 % — ABNORMAL LOW (ref 39.0–52.0)
Hemoglobin: 9.6 g/dL — ABNORMAL LOW (ref 13.0–17.0)
MCH: 27.8 pg (ref 26.0–34.0)
MCHC: 31.9 g/dL (ref 30.0–36.0)
MCV: 87.2 fL (ref 80.0–100.0)
Platelets: 146 K/uL — ABNORMAL LOW (ref 150–400)
RBC: 3.45 MIL/uL — ABNORMAL LOW (ref 4.22–5.81)
RDW: 18.4 % — ABNORMAL HIGH (ref 11.5–15.5)
WBC: 6.1 K/uL (ref 4.0–10.5)
nRBC: 0 % (ref 0.0–0.2)

## 2024-09-21 LAB — BASIC METABOLIC PANEL WITH GFR
Anion gap: 18 — ABNORMAL HIGH (ref 5–15)
BUN: 68 mg/dL — ABNORMAL HIGH (ref 8–23)
CO2: 24 mmol/L (ref 22–32)
Calcium: 8.1 mg/dL — ABNORMAL LOW (ref 8.9–10.3)
Chloride: 90 mmol/L — ABNORMAL LOW (ref 98–111)
Creatinine, Ser: 7.05 mg/dL — ABNORMAL HIGH (ref 0.61–1.24)
GFR, Estimated: 8 mL/min — ABNORMAL LOW
Glucose, Bld: 148 mg/dL — ABNORMAL HIGH (ref 70–99)
Potassium: 4.6 mmol/L (ref 3.5–5.1)
Sodium: 132 mmol/L — ABNORMAL LOW (ref 135–145)

## 2024-09-21 LAB — HEPARIN LEVEL (UNFRACTIONATED): Heparin Unfractionated: >1.1 [IU]/mL — ABNORMAL HIGH (ref 0.30–0.70)

## 2024-09-21 LAB — APTT: aPTT: 54 s — ABNORMAL HIGH (ref 24–36)

## 2024-09-21 MED ORDER — HEPARIN (PORCINE) 25000 UT/250ML-% IV SOLN
1800.0000 [IU]/h | INTRAVENOUS | Status: AC
  Administered 2024-09-21: 1200 [IU]/h via INTRAVENOUS
  Administered 2024-09-22 (×2): 1400 [IU]/h via INTRAVENOUS
  Administered 2024-09-23: 1800 [IU]/h via INTRAVENOUS
  Filled 2024-09-21 (×4): qty 250

## 2024-09-21 NOTE — Progress Notes (Signed)
 Patient experiencing Discomfort in left hand with necrotic tissue wound at 3rd and 4th tissue .Hand Doctor at bedside poss surgery on Tuesday afternoon.

## 2024-09-21 NOTE — TOC Initial Note (Addendum)
 Transition of Care [facility]) - Initial/Assessment Note    Patient Details  Name: [name] MRN: [id number] Date of Birth: [birth year]  Transition of Care [facility]) CM/SW Contact:    Luise JAYSON Pan, LCSWA Phone Number: 09/21/2024, 12:51 PM  Clinical Narrative:    Patient from [facility] ltc. Per facility, patient can return when medically stable, no prior insurance auth needed. CSW left VM for patients spouse, Katheryn Pollen.   3:21 PM Katheryn called CSW back to inform she is agreeable with patient discharging back to [facility] when medically stable.  CSW will continue to follow.            Expected Discharge Plan: Skilled Nursing Facility Barriers to Discharge: Continued Medical Work up   Patient Goals and CMS Choice Patient states their goals for this hospitalization and ongoing recovery are:: From ltc, returning when medically stable   Choice offered to / list presented to : NA      Expected Discharge Plan and Services In-house Referral: Clinical Social Work   Post Acute Care Choice: Skilled Nursing Facility Living arrangements for the past 2 months: Skilled Nursing Facility                                      Prior Living Arrangements/Services Living arrangements for the past 2 months: Skilled Nursing Facility Lives with:: Facility Resident Patient language and need for interpreter reviewed:: Yes Do you feel safe going back to the place where you live?: Yes      Need for Family Participation in Patient Care: Yes (Comment) Care giver support system in place?: Yes (comment)   Criminal Activity/Legal Involvement Pertinent to Current Situation/Hospitalization: No - Comment as needed  Activities of Daily Living   ADL Screening (condition at time of admission) Independently performs ADLs?: No Does the patient have a NEW difficulty with bathing/dressing/toileting/self-feeding that is expected to last >3 days?: Yes (Initiates electronic notice to provider for  possible OT consult) Does the patient have a NEW difficulty with getting in/out of bed, walking, or climbing stairs that is expected to last >3 days?: Yes (Initiates electronic notice to provider for possible PT consult) Does the patient have a NEW difficulty with communication that is expected to last >3 days?: Yes (Initiates electronic notice to provider for possible SLP consult) Is the patient deaf or have difficulty hearing?: No Does the patient have difficulty seeing, even when wearing glasses/contacts?: No Does the patient have difficulty concentrating, remembering, or making decisions?: Yes  Permission Sought/Granted Permission sought to share information with : Family Supports, Magazine Features Editor Permission granted to share information with : No (patietn from a facility, family contact info in chart)  Share Information with NAME: Meyering,[name] Delores  Spouse, Emergency Contact  [phone]  Permission granted to share info w AGENCY: SNF        Emotional Assessment Appearance:: Appears stated age Attitude/Demeanor/Rapport: Unable to Assess Affect (typically observed): Unable to Assess Orientation: : Oriented to Self, Oriented to Place, Oriented to  Time, Oriented to Situation Alcohol  / Substance Use: Not Applicable Psych Involvement: No (comment)  Admission diagnosis:  Hyperkalemia [E87.5] Acute metabolic encephalopathy [G93.41] Sepsis, due to unspecified organism, unspecified whether acute organ dysfunction present [facility]) [A41.9] Patient Active Problem List   Diagnosis Date Noted   Dry gangrene (HCC) 09/21/2024   HCAP (healthcare-associated pneumonia) 09/19/2024   Fever 09/19/2024   Acute metabolic encephalopathy 09/18/2024  Pressure injury of skin 08/27/2024   AMS (altered mental status) 08/25/2024   Volume overload 08/11/2024   Non compliance with medical treatment 03/28/2024   Decubitus ulcer of sacral region, stage 2 (HCC) 03/25/2024   Recurrent  Clostridioides difficile diarrhea 03/25/2024   Obesity, Class I, BMI 30-34.9 03/25/2024   Moderate aortic stenosis 03/18/2024   History of Clostridioides difficile colitis 02/12/2024   BPH (benign prostatic hyperplasia) 02/12/2024   Hyperkalemia 07/19/2023   End-stage renal disease on hemodialysis (HCC) 07/19/2023   Severe sepsis (HCC) 05/28/2023   History of right coronary artery stent placement 03/31/2023   Current use of long term anticoagulation 03/31/2023   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) 03/31/2023   Paroxysmal atrial fibrillation (HCC) 03/31/2023   Tobacco abuse 03/30/2023   Atrial fibrillation, chronic (HCC) 01/13/2022   History of anemia due to chronic kidney disease 01/13/2022   Obesity (BMI 30-39.9) 01/13/2022   Proliferative diabetic retinopathy of left eye with macular edema associated with type 2 diabetes mellitus (HCC) 11/27/2021   Atrial fibrillation with RVR (HCC) 06/14/2021   Carotid stenosis 06/15/2020   Dyslipidemia 06/15/2020   Diabetic macular edema of right eye with proliferative retinopathy associated with type 2 diabetes mellitus (HCC) 01/18/2020   Severe nonproliferative diabetic retinopathy of left eye, with macular edema, associated with type 2 diabetes mellitus (HCC) 01/04/2020   Retinal hemorrhage of right eye 01/04/2020   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   S/P carotid endarterectomy 09/18/2018   OSA not tolerating CPAP 09/18/2018   Chronic pain 09/18/2018   Peripheral artery disease 11/12/2016   Unilateral primary osteoarthritis, left knee 10/11/2016   Type 2 diabetes mellitus with diabetic neuropathy (HCC) 05/09/2016   DM2 (diabetes mellitus, type 2) (HCC) 12/16/2015   CAD (dz of distal, mid and proximal RCA with implantation of 3 overlapping drug-eluting stent, with elevated troponin 09/03/2015   (HFpEF) heart failure with preserved ejection fraction (HCC) 09/03/2015   HLD (hyperlipidemia) 08/25/2014   Limb pain  03/20/2013   Restless legs syndrome (RLS) 09/25/2012   COPD (chronic obstructive pulmonary disease) (HCC) 04/25/2012   Acute hyponatremia 11/01/2011   Hypokalemia 11/01/2011   Occlusion and stenosis of carotid artery without mention of cerebral infarction 06/12/2011   GERD (gastroesophageal reflux disease) 05/08/2011   Barrett's esophagus without dysplasia 05/08/2011   Former tobacco use 02/01/2011   PCP:  Montey Lot, PA-C Pharmacy:   [id number] [place], [place] - [address] AT [address] [place] [address] Phone: [phone] Fax: [phone]  [id number] - [place], [place] - [address]  [place] [address] Phone: [phone] Fax: [phone]  [facility] - [place], [place] - [address] [place] [address] Phone: [phone] Fax: [phone]     Social Drivers of Health (SDOH) Social History: SDOH Screenings   Food Insecurity: No Food Insecurity (03/25/2024)  Housing: Low Risk (03/25/2024)  Transportation Needs: No Transportation Needs (03/25/2024)  Utilities: Not At Risk (03/25/2024)  Social Connections: Moderately Isolated (03/25/2024)  Tobacco Use: High Risk (09/18/2024)   SDOH Interventions:     Readmission Risk Interventions    08/12/2024    1:17 PM 05/29/2023    4:41 PM  Readmission Risk Prevention Plan  Transportation Screening Complete Complete  Medication Review Oceanographer) Complete Complete  PCP or Specialist appointment within 3-5 days of discharge  Complete  HRI or Home Care Consult Complete Complete  SW Recovery Care/Counseling Consult  Complete  Palliative Care Screening Not Applicable Not Applicable  Skilled Nursing Facility Not Applicable Complete

## 2024-09-21 NOTE — Progress Notes (Signed)
 Pt has Refused Bath, Repositioning and Wound cre at this time stated maybe later.

## 2024-09-21 NOTE — Progress Notes (Signed)
" °   09/21/24 0730  Assess: MEWS Score  Temp 98 F (36.7 C)  BP 99/85  MAP (mmHg) 92  Pulse Rate 96  ECG Heart Rate (!) 112  Resp 20  SpO2 97 %  Assess: MEWS Score  MEWS Temp 0  MEWS Systolic 1  MEWS Pulse 2  MEWS RR 0  MEWS LOC 0  MEWS Score 3  MEWS Score Color Yellow  Assess: SIRS CRITERIA  SIRS Temperature  0  SIRS Respirations  0  SIRS Pulse 1  SIRS WBC 0  SIRS Score Sum  1   Chronic Yellow with elevated tem and HR, Receiving Antibiotics "

## 2024-09-21 NOTE — Progress Notes (Signed)
 PHARMACY - ANTICOAGULATION CONSULT NOTE  Pharmacy Consult for  apixaban  > heparin   Indication: atrial fibrillation  Allergies[1]  Patient Measurements: Height: 6' 2 (188 cm) (preamputation) Weight: 111.5 kg (245 lb 13 oz) IBW/kg (Calculated) : 82.2 HEPARIN  DW (KG): 105.5  Vital Signs: Temp: 98.6 F (37 C) (01/12 1124) Temp Source: Oral (01/12 1124) BP: 119/82 (01/12 1124) Pulse Rate: 98 (01/12 1124)  Labs: Recent Labs    09/18/24 2019 09/19/24 0336 09/19/24 0345 09/20/24 0247 09/21/24 0205  HGB 11.2* 10.0* 10.9*  --  9.6*  HCT 35.7* 30.9* 32.0*  --  30.1*  PLT 139* 118*  --   --  146*  APTT  --  45*  --   --   --   LABPROT 25.9* 27.3*  --   --   --   INR 2.2* 2.4*  --   --   --   CREATININE 7.47* 7.74*  --  6.39* 7.05*    Estimated Creatinine Clearance: 12.9 mL/min (A) (by C-G formula based on SCr of 7.05 mg/dL (H)).   Medical History: Past Medical History:  Diagnosis Date   Carotid artery occlusion 11/10/2010   LEFT CAROTID ENDARTERECTOMY   Complication of anesthesia    BP WENT UP AT [facility]    COPD (chronic obstructive pulmonary disease) (HCC)    pt denies this dx as of 06/01/20 - no inhaler    Diabetes mellitus without complication (HCC)    Diverticulitis    Diverticulosis of colon (without mention of hemorrhage)    DJD (degenerative joint disease)    knees/hands/feet/back/neck   ESRD (end stage renal disease) on dialysis (HCC)    Fatty liver    Full dentures    GERD (gastroesophageal reflux disease)    H/O hiatal hernia    History of blood transfusion    with a past surical procedure per patient 06/01/20   History of right below knee amputation (HCC) 07/12/2016   Hyperlipidemia    Hypertension    Neuromuscular disorder (HCC)    peripheral neuropathy   Non-pressure chronic ulcer of other part of left foot limited to breakdown of skin (HCC) 11/12/2016   Nonrheumatic aortic (valve) stenosis 03/18/2024   Limited TTE 03/18/2024: EF 55-60, moderate LVH,  normal RVSF, moderate LAE, mild MR, mild-moderate AS (mean 12, V-max 235 cm/s, DI 0.35), aortic root 39 mm, moderate pleural effusion, trivial pericardial effusion    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 Atherectomy left popliteal artery July 2012.    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 CPP, BCPS Clinical Pharmacist [phone] 09/21/2024 2:27 PM       [1]  Allergies Allergen Reactions   Contrast Media [Iodinated Contrast Media] Shortness Of Breath and Other (See Comments)    Altered mental status     Ivp Dye [Iodinated Contrast Media] Anaphylaxis, Shortness Of Breath and Other (See Comments)    Altered mental status     Latex Rash    severe rash appears after the first 24 hours of being placed   Tape Rash and Other (See Comments)    Rash after 1 day of use  Do not leave on longer then 3 days without changing

## 2024-09-21 NOTE — NC FL2 (Signed)
 " [place]  MEDICAID FL2 LEVEL OF CARE FORM     IDENTIFICATION  Patient Name: [name] Birthdate: 15-Sep-1955 Sex: male Admission Date (Current Location): 09/18/2024  [facility] and [place] Number:  Producer, Television/film/video and Address:  [facility] [place], [address] [place], [place] [address]      Provider Number: [id number]  Attending Physician Name and Address:  Jillian Buttery, MD  Relative Name and Phone Number:  Mykal, Batiz (Spouse) [phone] (Mobile)    Current Level of Care: Hospital Recommended Level of Care: Skilled Nursing Facility Prior Approval Number:    Date Approved/Denied:   PASRR Number: [id number]  Discharge Plan: SNF    Current Diagnoses: Patient Active Problem List   Diagnosis Date Noted   Dry gangrene (HCC) 09/21/2024   HCAP (healthcare-associated pneumonia) 09/19/2024   Fever 09/19/2024   Acute metabolic encephalopathy 09/18/2024   Pressure injury of skin 08/27/2024   AMS (altered mental status) 08/25/2024   Volume overload 08/11/2024   Non compliance with medical treatment 03/28/2024   Decubitus ulcer of sacral region, stage 2 (HCC) 03/25/2024   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/2024   Obesity, Class I, BMI 30-34.9 03/25/2024   Moderate aortic stenosis 03/18/2024   History of Clostridioides difficile colitis 02/12/2024   BPH (benign prostatic hyperplasia) 02/12/2024   Hyperkalemia 07/19/2023   End-stage renal disease on hemodialysis (HCC) 07/19/2023   Severe sepsis (HCC) 05/28/2023   History of right coronary artery stent placement 03/31/2023   Current use of long term anticoagulation 03/31/2023   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) 03/31/2023   Paroxysmal atrial fibrillation (HCC) 03/31/2023   Tobacco abuse 03/30/2023   Atrial fibrillation, chronic (HCC) 01/13/2022   History of anemia due to chronic kidney disease 01/13/2022   Obesity (BMI 30-39.9) 01/13/2022    Proliferative diabetic retinopathy of left eye with macular edema associated with type 2 diabetes mellitus (HCC) 11/27/2021   Atrial fibrillation with RVR (HCC) 06/14/2021   Carotid stenosis 06/15/2020   Dyslipidemia 06/15/2020   Diabetic macular edema of right eye with proliferative retinopathy associated with type 2 diabetes mellitus (HCC) 01/18/2020   Severe nonproliferative diabetic retinopathy of left eye, with macular edema, associated with type 2 diabetes mellitus (HCC) 01/04/2020   Retinal hemorrhage of right eye 01/04/2020   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   S/P carotid endarterectomy 09/18/2018   OSA not tolerating CPAP 09/18/2018   Chronic pain 09/18/2018   Peripheral artery disease 11/12/2016   Unilateral primary osteoarthritis, left knee 10/11/2016   Type 2 diabetes mellitus with diabetic neuropathy (HCC) 05/09/2016   DM2 (diabetes mellitus, type 2) (HCC) 12/16/2015   CAD (dz of distal, mid and proximal RCA with implantation of 3 overlapping drug-eluting stent, with elevated troponin 09/03/2015   (HFpEF) heart failure with preserved ejection fraction (HCC) 09/03/2015   HLD (hyperlipidemia) 08/25/2014   Limb pain 03/20/2013   Restless legs syndrome (RLS) 09/25/2012   COPD (chronic obstructive pulmonary disease) (HCC) 04/25/2012   Acute hyponatremia 11/01/2011   Hypokalemia 11/01/2011   Occlusion and stenosis of carotid artery without mention of cerebral infarction 06/12/2011   GERD (gastroesophageal reflux disease) 05/08/2011   Barrett's esophagus without dysplasia 05/08/2011   Former tobacco use 02/01/2011    Orientation RESPIRATION BLADDER Height & Weight     Self, Time, Situation, Place  Normal Continent Weight: 245 lb 13 oz (111.5 kg) Height:  6' 2 (188 cm) (preamputation)  BEHAVIORAL SYMPTOMS/MOOD NEUROLOGICAL BOWEL NUTRITION STATUS  Continent Diet (Please see dc summary)  AMBULATORY STATUS COMMUNICATION OF NEEDS Skin     Verbally PU Stage and Appropriate  Care (Pressure Injury Thigh Left;Posterior Stage 1)                       Personal Care Assistance Level of Assistance  Bathing, Feeding, Dressing Bathing Assistance: Maximum assistance Feeding assistance: Limited assistance Dressing Assistance: Maximum assistance     Functional Limitations Info  Sight, Hearing, Speech Sight Info: Impaired (eyeglasses) Hearing Info: Adequate Speech Info: Adequate    SPECIAL CARE FACTORS FREQUENCY                       Contractures Contractures Info: Not present    Additional Factors Info  Code Status, Allergies Code Status Info: Full code Allergies Info: Contrast Media (iodinated Contrast Media); Ivp Dye (iodinated Contrast Media); Latex; Tape           Current Medications (09/21/2024):  This is the current hospital active medication list Current Facility-Administered Medications  Medication Dose Route Frequency Provider Last Rate Last Admin   acetaminophen  (TYLENOL ) tablet 650 mg  650 mg Oral Q6H PRN Howerter, Justin B, DO   650 mg at 09/20/24 1958   Or   acetaminophen  (TYLENOL ) suppository 650 mg  650 mg Rectal Q6H PRN Howerter, Justin B, DO       alum & mag hydroxide-simeth (MAALOX/MYLANTA) 200-200-20 MG/5ML suspension 30 mL  30 mL Oral Q4H PRN Jillian Buttery, MD       atorvastatin  (LIPITOR ) tablet 80 mg  80 mg Oral QHS Howerter, Justin B, DO   80 mg at 09/20/24 2136   benzonatate  (TESSALON ) capsule 200 mg  200 mg Oral TID PRN Howerter, Justin B, DO       cefTRIAXone  (ROCEPHIN ) 2 g in sodium chloride  0.9 % 100 mL IVPB  2 g Intravenous Q24H Jillian Buttery, MD 200 mL/hr at 09/20/24 1954 2 g at 09/20/24 1954   Chlorhexidine  Gluconate Cloth 2 % PADS 6 each  6 each Topical Q0600 Gearline Norris, MD   6 each at 09/21/24 9491   collagenase  (SANTYL ) ointment   Topical Daily Jillian Buttery, MD   Given at 09/21/24 1002   ferrous sulfate  tablet 325 mg  325 mg Oral Q breakfast Howerter, Justin B, DO   325 mg at 09/21/24 9165    insulin  aspart (novoLOG ) injection 0-6 Units  0-6 Units Subcutaneous TID WC Howerter, Justin B, DO   1 Units at 09/21/24 1217   lip balm (CARMEX) ointment 1 Application  1 Application Topical PRN Jillian Buttery, MD   1 Application at 09/20/24 1613   melatonin tablet 3 mg  3 mg Oral QHS PRN Howerter, Justin B, DO   3 mg at 09/19/24 2107   metoprolol  tartrate (LOPRESSOR ) injection 5 mg  5 mg Intravenous Q4H PRN Howerter, Justin B, DO       Muscle Rub CREA 1 Application  1 Application Topical PRN Jillian Buttery, MD   1 Application at 09/20/24 1748   nicotine  (NICODERM CQ  - dosed in mg/24 hours) patch 21 mg  21 mg Transdermal Daily PRN Howerter, Justin B, DO       ondansetron  (ZOFRAN ) injection 4 mg  4 mg Intravenous Q6H PRN Howerter, Justin B, DO       oxyCODONE  (Oxy IR/ROXICODONE ) immediate release tablet 5 mg  5 mg Oral Q6H PRN Jillian Buttery, MD   5 mg at 09/20/24 1229  polyethylene glycol (MIRALAX  / GLYCOLAX ) packet 17 g  17 g Oral Daily PRN Howerter, Justin B, DO       sevelamer  carbonate (RENVELA ) tablet 800 mg  800 mg Oral TID WC Howerter, Justin B, DO   800 mg at 09/21/24 1230   sodium zirconium cyclosilicate  (LOKELMA ) packet 10 g  10 g Oral Daily Cleotilde Rogue, MD   10 g at 09/21/24 9165     Discharge Medications: Please see discharge summary for a list of discharge medications.  Relevant Imaging Results:  Relevant Lab Results:   Additional Information ss#[id number].   HD patient [facility] [place] on TTS)  Genette Huertas C Maston Wight, LCSWA     "

## 2024-09-21 NOTE — Progress Notes (Signed)
 Pt receives out-pt HD at [facility] on TTS 11:40 am chair time. Will assist as needed.   Randine Mungo Dialysis Navigator [phone]

## 2024-09-21 NOTE — Progress Notes (Addendum)
 PHARMACY - ANTICOAGULATION CONSULT NOTE  Pharmacy Consult for  apixaban  > heparin   Indication: atrial fibrillation  Allergies[1]  Patient Measurements: Height: 6' 2 (188 cm) (preamputation) Weight: 111.5 kg (245 lb 13 oz) IBW/kg (Calculated) : 82.2 HEPARIN  DW (KG): 105.5  Vital Signs: Temp: 98.5 F (36.9 C) (01/12 2023) Temp Source: Oral (01/12 2023) BP: 119/52 (01/12 2023) Pulse Rate: 77 (01/12 2023)  Labs: Recent Labs    09/19/24 0336 09/19/24 0345 09/20/24 0247 09/21/24 0205 09/21/24 2137  HGB 10.0* 10.9*  --  9.6*  --   HCT 30.9* 32.0*  --  30.1*  --   PLT 118*  --   --  146*  --   APTT 45*  --   --   --  54*  LABPROT 27.3*  --   --   --   --   INR 2.4*  --   --   --   --   HEPARINUNFRC  --   --   --   --  >1.10*  CREATININE 7.74*  --  6.39* 7.05*  --     Estimated Creatinine Clearance: 12.9 mL/min (A) (by C-G formula based on SCr of 7.05 mg/dL (H)).   Assessment: 68yom with atrial fibrillation on apixaban  pta  Has gangrenous finger plan amputation 1/14 Hold apixaban  - last dose 1/11 pm Bridge with heparin  drip pre/post op  Cbc stable   Heparin  level >1.1 (Affected by Eliquis  as expected), aPTT 54 sec (subtherapeutic) on infusion at 1200 units/hr. No issues with line or bleeding reported per RN.  Goal of Therapy:  aPTT 66-102 sec  Heparin  level 0.3-0.7 units/ml Monitor platelets by anticoagulation protocol: Yes   Plan:  Increase heparin  drip to 1400 units/hr  F/u 8h aPTT F/u with orthopedics regarding when to hold heparin  gtt pre-OR on 1/14  Vito Ralph, PharmD, BCPS Please see amion for complete clinical pharmacist phone list 09/21/2024 10:40 PM        [1]  Allergies Allergen Reactions   Contrast Media [Iodinated Contrast Media] Shortness Of Breath and Other (See Comments)    Altered mental status     Ivp Dye [Iodinated Contrast Media] Anaphylaxis, Shortness Of Breath and Other (See Comments)    Altered mental status     Latex Rash     severe rash appears after the first 24 hours of being placed   Tape Rash and Other (See Comments)    Rash after 1 day of use  Do not leave on longer then 3 days without changing

## 2024-09-21 NOTE — Progress Notes (Addendum)
 " PROGRESS NOTE  SIEGFRIED VIETH  FMW:[id number] DOB: 11/04/1955 DOA: 09/18/2024 PCP: Montey Lot, PA-C   Brief Narrative: Patient is a 69 year old male with history of bilateral BKA, chronic A-fib on Eliquis , ESRD on dialysis on TTS schedule, insulin -dependent diabetes type 2, hypertension, moderate aortic stenosis, chronic pain syndrome, chronic right-sided systolic heart failure who presented with complaint of confusion, fever.  On presentation he was febrile, maintaining saturation on room air.  Lab work showed sodium of 128, potassium 6.3, lactate of 1.4, WBC count of 11.8.  COVID/flu/RSV negative.  EKG showed atrial fibrillation.  Chest x-ray showed left lower lobe airspace opacities concerning for pneumonia, new small-moderate left pleural effusion.  Nephrology consulted for dialysis.  Currently being managed for  pneumonia/ Proteus bacteremia.  Sepsis physiology has resolved.  Currently afebrile, on room air, alert and oriented.  Hand surgery planning for amputation of gangrenous left ring finger Wednesday.  Assessment & Plan:  Principal Problem:   Acute metabolic encephalopathy Active Problems:   Atrial fibrillation, chronic (HCC)   History of anemia due to chronic kidney disease   End-stage renal disease on hemodialysis (HCC)   Severe sepsis (HCC)   DM2 (diabetes mellitus, type 2) (HCC)   HLD (hyperlipidemia)   Acute hyponatremia   Tobacco abuse   Hyperkalemia   Moderate aortic stenosis   HCAP (healthcare-associated pneumonia)   Fever   Dry gangrene (HCC)   Acute metabolic encephalopathy: Usually alert and oriented.  Presented with confusion, fever.  Takes fentanyl  patch, oxycodone , gabapentin .  Polypharmacy could also be possible.  MRI done on 12/18 showed chronic encephalomalacia changes in the right parietal lobe and to a lesser extent the right frontal lobe.Moderately advanced cerebral white matter disease bilaterally. TSH, vitamin B12 optimal.  Mildly elevated ammonia level  of 41, we do not think this is contributing to his confusion. Confusion likely from metabolic encephalopathy from sepsis secondary to pneumonia/bacteremia. This morning he remains alert and oriented.  Sepsis secondary to pneumonia/HCAP/Proteus bacteremia: Presented with fever.  Chest x-ray showed left lower lobe opacity concerning for pneumonia.  Initial lactate was normal.  Blood culture showing Proteus.  Continue ceftriaxone    ESRD on dialysis/hyperkalemia: Nephrology following for dialysis.  Has AV fistula on the left upper extremity  Acute on chronic hypoosmolar hyponatremia: Volume management per dialysis.  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  The finger is black.  Plan for amputation on Wednesday  Chronic A-fib: Monitor on telemetry.  On Eliquis  which is currently on hold for upcoming surgery.  Takes Coreg  for rate control.  Started on IV heparin   Type 2 diabetes: Continue current insulin  regimen.  Monitor blood sugars.  Recent A1c of 6.9.  Hyperlipidemia: On Lipitor   History of moderate aortic stenosis: Patient echo showed EF of 50 to 55%, moderate aortic stenosis.  We recommend to follow-up with cardiology as an outpatient  Anemia chronic disease: Currently hemoglobin stable  Chronic tobacco use: Continues to smoke.  Counseled cessation.  Continue current patch  Debility/deconditioning: Patient residing at [facility].  Patient has bilateral BKA     Wound 08/25/24 2047 Pressure Injury Sacrum Stage 2 -  Partial thickness loss of dermis presenting as a shallow open injury with a red, pink wound bed without slough. (Active)     Wound 09/19/24 1735 Pressure Injury Thigh Left;Posterior Stage 1 -  Intact skin with non-blanchable redness of a localized area usually over a bony prominence. (Active)    DVT prophylaxis:  Code Status: Full Code  Family Communication: Wife on phone on 1/12  Patient status: Inpatient  Patient is from :  SNF  Anticipated discharge to: SNF  Estimated DC date: Thursday or Friday   Consultants: Nephrology  Procedures: Dialysis  Antimicrobials:  Anti-infectives (From admission, onward)    Start     Dose/Rate Route Frequency Ordered Stop   09/19/24 2200  ceFEPIme  (MAXIPIME ) 1 g in sodium chloride  0.9 % 100 mL IVPB  Status:  Discontinued        1 g 200 mL/hr over 30 Minutes Intravenous Every 24 hours 09/18/24 2230 09/19/24 1005   09/19/24 2000  cefTRIAXone  (ROCEPHIN ) 2 g in sodium chloride  0.9 % 100 mL IVPB        2 g 200 mL/hr over 30 Minutes Intravenous Every 24 hours 09/19/24 1005     09/19/24 1200  azithromycin  (ZITHROMAX ) tablet 500 mg        500 mg Oral Daily 09/19/24 1005 09/21/24 2359   09/18/24 1945  vancomycin  (VANCOREADY) IVPB 2000 mg/400 mL        2,000 mg 200 mL/hr over 120 Minutes Intravenous  Once 09/18/24 1938 09/18/24 2334   09/18/24 1930  ceFEPIme  (MAXIPIME ) 2 g in sodium chloride  0.9 % 100 mL IVPB        2 g 200 mL/hr over 30 Minutes Intravenous  Once 09/18/24 1928 09/18/24 2052   09/18/24 1930  metroNIDAZOLE  (FLAGYL ) IVPB 500 mg        500 mg 100 mL/hr over 60 Minutes Intravenous  Once 09/18/24 1928 09/18/24 2132   09/18/24 1930  vancomycin  (VANCOCIN ) IVPB 1000 mg/200 mL premix  Status:  Discontinued        1,000 mg 200 mL/hr over 60 Minutes Intravenous  Once 09/18/24 1928 09/18/24 1938       Subjective: Patient seen and examined at bedside today.  Hemodynamically stable lying on bed.  Remains comfortable.  Alert and oriented.  No shortness of breath or cough.  Afebrile.  No new complaints  Objective: Vitals:   09/21/24 0358 09/21/24 0500 09/21/24 0730 09/21/24 1000  BP: 124/64  99/85 114/81  Pulse: 98  96 68  Resp: 20  20   Temp: 99.1 F (37.3 C)  98 F (36.7 C) 98 F (36.7 C)  TempSrc: Oral  Oral Oral  SpO2: 100%  97% 99%  Weight:  111.5 kg    Height:        Intake/Output Summary (Last 24 hours) at 09/21/2024 1059 Last data filed at 09/20/2024  1600 Gross per 24 hour  Intake 120 ml  Output --  Net 120 ml   Filed Weights   09/19/24 1701 09/20/24 0418 09/21/24 0500  Weight: 111.9 kg 108.7 kg 111.5 kg    Examination:    General exam: Overall comfortable, not in distress, chronically deconditioned HEENT: PERRL Respiratory system:  no wheezes or crackles  Cardiovascular system: Irregularly irregular rhythm Gastrointestinal system: Abdomen is nondistended, soft and nontender. Central nervous system: Alert and oriented Extremities: No edema, no clubbing ,no cyanosis, AV fistula in the left upper extremity, bilateral BKA, dry gangrene of the left ring finger Skin: No rashes, no ulcers,no icterus      Data Reviewed: I have personally reviewed following labs and imaging studies  CBC: Recent Labs  Lab 09/18/24 2019 09/19/24 0336 09/19/24 0345 09/21/24 0205  WBC 11.8* 8.6  --  6.1  NEUTROABS 9.6* 6.8  --   --   HGB 11.2* 10.0* 10.9* 9.6*  HCT 35.7* 30.9* 32.0* 30.1*  MCV 89.9 88.3  --  87.2  PLT 139* 118*  --  146*   Basic Metabolic Panel: Recent Labs  Lab 09/18/24 2019 09/19/24 0336 09/19/24 0345 09/19/24 2040 09/20/24 0247 09/21/24 0205  NA 128* 132* 130*  --  135 132*  K 6.3* 5.8* 5.6*  --  4.4 4.6  CL 88* 90*  --   --  92* 90*  CO2 24 21*  --   --  25 24  GLUCOSE 148* 121*  --   --  108* 148*  BUN 69* 71*  --   --  51* 68*  CREATININE 7.47* 7.74*  --   --  6.39* 7.05*  CALCIUM  8.7* 8.6*  --   --  8.7* 8.1*  MG  --  2.2  --  2.0  --   --   PHOS  --  6.8*  --   --   --   --      Recent Results (from the past 240 hours)  Resp panel by RT-PCR (RSV, Flu A&B, Covid) Anterior Nasal Swab     Status: None   Collection Time: 09/18/24  7:54 PM   Specimen: Anterior Nasal Swab  Result Value Ref Range Status   SARS Coronavirus 2 by RT PCR NEGATIVE NEGATIVE Final   Influenza A by PCR NEGATIVE NEGATIVE Final   Influenza B by PCR NEGATIVE NEGATIVE Final    Comment: (NOTE) The Xpert Xpress SARS-CoV-2/FLU/RSV  plus assay is intended as an aid in the diagnosis of influenza from Nasopharyngeal swab specimens and should not be used as a sole basis for treatment. Nasal washings and aspirates are unacceptable for Xpert Xpress SARS-CoV-2/FLU/RSV testing.  Fact Sheet for Patients: bloggercourse.com  Fact Sheet for Healthcare Providers: seriousbroker.it  This test is not yet approved or cleared by the [place]  FDA and has been authorized for detection and/or diagnosis of SARS-CoV-2 by FDA under an Emergency Use Authorization (EUA). This EUA will remain in effect (meaning this test can be used) for the duration of the COVID-19 declaration under Section 564(b)(1) of the Act, 21 U.S.C. section 360bbb-3(b)(1), unless the authorization is terminated or revoked.     Resp Syncytial Virus by PCR NEGATIVE NEGATIVE Final    Comment: (NOTE) Fact Sheet for Patients: bloggercourse.com  Fact Sheet for Healthcare Providers: seriousbroker.it  This test is not yet approved or cleared by the [place]  FDA and has been authorized for detection and/or diagnosis of SARS-CoV-2 by FDA under an Emergency Use Authorization (EUA). This EUA will remain in effect (meaning this test can be used) for the duration of the COVID-19 declaration under Section 564(b)(1) of the Act, 21 U.S.C. section 360bbb-3(b)(1), unless the authorization is terminated or revoked.  Performed at [facility], [address] [place], [place] [address]   Blood Culture (routine x 2)     Status: Abnormal (Preliminary result)   Collection Time: 09/18/24  8:19 PM   Specimen: BLOOD RIGHT ARM  Result Value Ref Range Status   Specimen Description BLOOD RIGHT ARM  Final   Special Requests   Final    BOTTLES DRAWN AEROBIC AND ANAEROBIC Blood Culture adequate volume   Culture  Setup Time   Final    GRAM NEGATIVE RODS AEROBIC BOTTLE  ONLY CRITICAL RESULT CALLED TO, READ BACK BY AND VERIFIED WITH: PHARMD J LEDFORD 09/19/2024 @ 2351 BY AB    Culture (A)  Final    PROTEUS MIRABILIS SUSCEPTIBILITIES TO  FOLLOW Performed at [facility], [address] [place], [place] [address]    Report Status PENDING  Incomplete  Blood Culture ID Panel (Reflexed)     Status: Abnormal   Collection Time: 09/18/24  8:19 PM  Result Value Ref Range Status   Enterococcus faecalis NOT DETECTED NOT DETECTED Final   Enterococcus Faecium NOT DETECTED NOT DETECTED Final   Listeria monocytogenes NOT DETECTED NOT DETECTED Final   Staphylococcus species NOT DETECTED NOT DETECTED Final   Staphylococcus aureus (BCID) NOT DETECTED NOT DETECTED Final   Staphylococcus epidermidis NOT DETECTED NOT DETECTED Final   Staphylococcus lugdunensis NOT DETECTED NOT DETECTED Final   Streptococcus species NOT DETECTED NOT DETECTED Final   Streptococcus agalactiae NOT DETECTED NOT DETECTED Final   Streptococcus pneumoniae NOT DETECTED NOT DETECTED Final   Streptococcus pyogenes NOT DETECTED NOT DETECTED Final   A.calcoaceticus-baumannii NOT DETECTED NOT DETECTED Final   Bacteroides fragilis NOT DETECTED NOT DETECTED Final   Enterobacterales DETECTED (A) NOT DETECTED Final    Comment: Enterobacterales represent a large order of gram negative bacteria, not a single organism. CRITICAL RESULT CALLED TO, READ BACK BY AND VERIFIED WITH: PHARMD J LEDFORD 09/19/2024 @ 2351 BY AB    Enterobacter cloacae complex NOT DETECTED NOT DETECTED Final   Escherichia coli NOT DETECTED NOT DETECTED Final   Klebsiella aerogenes NOT DETECTED NOT DETECTED Final   Klebsiella oxytoca NOT DETECTED NOT DETECTED Final   Klebsiella pneumoniae NOT DETECTED NOT DETECTED Final   Proteus species DETECTED (A) NOT DETECTED Final    Comment: CRITICAL RESULT CALLED TO, READ BACK BY AND VERIFIED WITH: PHARMD J LEDFORD 09/19/2024 @ 2351 BY AB    Salmonella species NOT DETECTED NOT DETECTED  Final   Serratia marcescens NOT DETECTED NOT DETECTED Final   Haemophilus influenzae NOT DETECTED NOT DETECTED Final   Neisseria meningitidis NOT DETECTED NOT DETECTED Final   Pseudomonas aeruginosa NOT DETECTED NOT DETECTED Final   Stenotrophomonas maltophilia NOT DETECTED NOT DETECTED Final   Candida albicans NOT DETECTED NOT DETECTED Final   Candida auris NOT DETECTED NOT DETECTED Final   Candida glabrata NOT DETECTED NOT DETECTED Final   Candida krusei NOT DETECTED NOT DETECTED Final   Candida parapsilosis NOT DETECTED NOT DETECTED Final   Candida tropicalis NOT DETECTED NOT DETECTED Final   Cryptococcus neoformans/gattii NOT DETECTED NOT DETECTED Final   CTX-M ESBL NOT DETECTED NOT DETECTED Final   Carbapenem resistance IMP NOT DETECTED NOT DETECTED Final   Carbapenem resistance KPC NOT DETECTED NOT DETECTED Final   Carbapenem resistance NDM NOT DETECTED NOT DETECTED Final   Carbapenem resist OXA 48 LIKE NOT DETECTED NOT DETECTED Final   Carbapenem resistance VIM NOT DETECTED NOT DETECTED Final    Comment: Performed at [facility], [address], [place], [place] [address]     Radiology Studies: No results found.   Scheduled Meds:  atorvastatin   80 mg Oral QHS   azithromycin   500 mg Oral Q1200   Chlorhexidine  Gluconate Cloth  6 each Topical Q0600   collagenase    Topical Daily   ferrous sulfate   325 mg Oral Q breakfast   insulin  aspart  0-6 Units Subcutaneous TID WC   sevelamer  carbonate  800 mg Oral TID WC   sodium zirconium cyclosilicate   10 g Oral Daily   Continuous Infusions:  cefTRIAXone  (ROCEPHIN )  IV 2 g (09/20/24 1954)     LOS: 3 days   Ivonne Mustache, MD [facility] P1/08/2025, 10:59 AM  "

## 2024-09-21 NOTE — Progress Notes (Signed)
 Attempt to give Nurse report. Nurse busy right now

## 2024-09-21 NOTE — Consult Note (Signed)
 "    HAND SURGERY CONSULTATION  REQUESTING PHYSICIAN: Jillian Buttery, MD   Chief Complaint: Left ring finger dry gangrene with necrotic tip  HPI: [name] is a 69 y.o. male with complex medical history of bilateral BKA, chronic A-fib on Eliquis , end-stage renal on dialysis, insulin -dependent diabetes type 2, currently admitted for confusion, fever and abnormal electrolyte levels.  Nephrology was consulted for dialysis and he is currently being managed for pneumonia.  Blood cultures were showing Proteus, sepsis is presently improved.  Hand surgery was consulted for left ring finger ongoing dry gangrene over the past multiple months with necrotic distal portion of the digit.   Past Medical History:  Diagnosis Date   Carotid artery occlusion 11/10/2010   LEFT CAROTID ENDARTERECTOMY   Complication of anesthesia    BP WENT UP AT [facility]    COPD (chronic obstructive pulmonary disease) (HCC)    pt denies this dx as of 06/01/20 - no inhaler    Diabetes mellitus without complication (HCC)    Diverticulitis    Diverticulosis of colon (without mention of hemorrhage)    DJD (degenerative joint disease)    knees/hands/feet/back/neck   ESRD (end stage renal disease) on dialysis (HCC)    Fatty liver    Full dentures    GERD (gastroesophageal reflux disease)    H/O hiatal hernia    History of blood transfusion    with a past surical procedure per patient 06/01/20   History of right below knee amputation (HCC) 07/12/2016   Hyperlipidemia    Hypertension    Neuromuscular disorder (HCC)    peripheral neuropathy   Non-pressure chronic ulcer of other part of left foot limited to breakdown of skin (HCC) 11/12/2016   Nonrheumatic aortic (valve) stenosis 03/18/2024   Limited TTE 03/18/2024: EF 55-60, moderate LVH, normal RVSF, moderate LAE, mild MR, mild-moderate AS (mean 12, V-max 235 cm/s, DI 0.35), aortic root 39 mm, moderate pleural effusion, trivial pericardial effusion    Osteomyelitis  (HCC)    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 Atherectomy left popliteal artery July 2012.    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   Unstable angina (HCC) 09/16/2018   Wears glasses    Past Surgical History:  Procedure Laterality Date   ABDOMINAL AORTOGRAM W/LOWER EXTREMITY N/A 06/23/2021   Procedure: ABDOMINAL AORTOGRAM W/LOWER EXTREMITY;  Surgeon: Elmira Newman PARAS, MD;  [facility];  Service: Cardiovascular;  Laterality: N/A;   AMPUTATION  11/05/2011   Procedure: AMPUTATION RAY;  Surgeon: Norleen Armor, MD;  Location: [facility] OR;  Service: Orthopedics;  Laterality: Right;  Amputation of Right 4&5th Toes   AMPUTATION Left 11/26/2012   Procedure: AMPUTATION RAY;  Surgeon: Norleen Armor, MD;  Location: [facility] OR;  Service: Orthopedics;  Laterality: Left;  fourth ray amputation   AMPUTATION Right 08/27/2014   Procedure: Transmetatarsal Amputation;  Surgeon: Jerona Harden GAILS, MD;  Location: [facility] OR;  Service: Orthopedics;  Laterality: Right;   AMPUTATION Right 01/14/2015   Procedure: AMPUTATION BELOW KNEE;  Surgeon: Jerona Harden GAILS, MD;  Location: [facility] OR;  Service: Orthopedics;  Laterality: Right;   AMPUTATION Left 10/21/2015   Procedure: Left Foot 5th  Ray Amputation;  Surgeon: Jerona Harden GAILS, MD;  Location: [facility] OR;  Service: Orthopedics;  Laterality: Left;   AMPUTATION Left 07/04/2022   Procedure: LEFT BELOW KNEE AMPUTATION;  Surgeon: Harden Jerona GAILS, MD;  Location: [facility] OR;  Service: Orthopedics;  Laterality: Left;   ANTERIOR FUSION CERVICAL SPINE  02/06/2006   C4-5, C5-6, C6-7; SURGEON DR. MAX COHEN   AV FISTULA PLACEMENT Left 06/02/2020   Procedure: ARTERIOVENOUS (AV) FISTULA CREATION LEFT;  Surgeon: Sheree Penne Bruckner, MD;  Location: [facility] OR;  Service: Vascular;  Laterality: Left;   BACK SURGERY     x 3   BASCILIC VEIN TRANSPOSITION Left 07/21/2020   Procedure: LEFT UPPER ARM ATERIOVENOUS SUPERFISTULALIZATION;  Surgeon: Sheree Penne Bruckner, MD;  Location: [facility] OR;  Service: Vascular;  Laterality: Left;   BELOW KNEE LEG AMPUTATION Right    CARDIAC CATHETERIZATION  10/31/2004   2009   CAROTID ENDARTERECTOMY  11/10/2010   CAROTID ENDARTERECTOMY Left 11/10/2010   Subtotal occlusion of left internal carotid artery with left hemispheric transient ischemic attacks.   CAROTID STENT     CARPAL TUNNEL RELEASE Right 10/21/2013   Procedure: RIGHT CARPAL TUNNEL RELEASE;  Surgeon: Arley JONELLE Curia, MD;  Location: [facility];  Service: Orthopedics;  Laterality: Right;   CHOLECYSTECTOMY     COLON SURGERY     COLONOSCOPY     COLOSTOMY REVERSAL  05/21/2018   ileostomy reversal   CORONARY LITHOTRIPSY N/A 11/24/2023   Procedure: CORONARY LITHOTRIPSY;  Surgeon: Jordan, Peter M, MD;  Location: [facility] INVASIVE CV LAB;  Service: Cardiovascular;  Laterality: N/A;   CORONARY STENT INTERVENTION N/A 11/24/2023   Procedure: CORONARY STENT INTERVENTION;  Surgeon: Jordan, Peter M, MD;  Location: [facility] INVASIVE CV LAB;  Service: Cardiovascular;  Laterality: N/A;   CYSTOSCOPY WITH STENT PLACEMENT Bilateral 01/13/2018   Procedure: CYSTOSCOPY WITH BILATERAL URETERAL CATHETER PLACEMENT;  Surgeon: Cam Morene ORN, MD;  Location: [facility] ORS;  Service: Urology;  Laterality: Bilateral;   ESOPHAGEAL MANOMETRY Bilateral 07/19/2014   Procedure: ESOPHAGEAL MANOMETRY (EM);  Surgeon: Gordy CHRISTELLA Starch, MD;  Location: [facility];  Service: Gastroenterology;  Laterality: Bilateral;   EYE SURGERY Bilateral 2020   cataract   FEMORAL ARTERY STENT     x6   FINGER SURGERY     FOOT SURGERY  04/25/2016    EXCISION BASE 5TH METATARSAL AND PARTIAL CUBOID LEFT FOOT   HERNIA REPAIR     LEFT INGUINAL AND UMBILICAL REPAIRS   HERNIA  REPAIR     I & D EXTREMITY Left 04/25/2016   Procedure: EXCISION BASE 5TH METATARSAL AND PARTIAL CUBOID LEFT FOOT;  Surgeon: Jerona GAILS Harden, MD;  [facility];  Service: Orthopedics;  Laterality: Left;   ILEOSTOMY  01/13/2018   Procedure: ILEOSTOMY;  Surgeon: Signe Mitzie LABOR, MD;  Location: [facility]S;  Service: General;;   ILEOSTOMY CLOSURE N/A 05/21/2018   Procedure: ILEOSTOMY REVERSAL ERAS PATHWAY;  Surgeon: Signe Mitzie LABOR, MD;  Location: [facility];  Service: General;  Laterality: N/A;   IR RADIOLOGIST EVAL & MGMT  11/19/2017   IR RADIOLOGIST EVAL & MGMT  12/03/2017   IR RADIOLOGIST EVAL & MGMT  12/18/2017   JOINT REPLACEMENT Right 2001   Total knee   LAMINECTOMY     X 3 LUMBAR AND X 2 CERVICAL SPINE OPERATIONS   LAPAROSCOPIC CHOLECYSTECTOMY W/ CHOLANGIOGRAPHY  11/09/2004   SURGEON DR. DEWARD CANDIE NULL   LEFT HEART CATH AND CORONARY ANGIOGRAPHY N/A 09/16/2018   Procedure: LEFT HEART CATH AND  CORONARY ANGIOGRAPHY;  Surgeon: Elmira Newman PARAS, MD;  [facility];  Service: Cardiovascular;  Laterality: N/A;   LEFT HEART CATH AND CORONARY ANGIOGRAPHY N/A 11/24/2023   Procedure: LEFT HEART CATH AND CORONARY ANGIOGRAPHY;  Surgeon: Jordan, Peter M, MD;  Location: [facility] INVASIVE CV LAB;  Service: Cardiovascular;  Laterality: N/A;   LEFT HEART CATHETERIZATION WITH CORONARY ANGIOGRAM N/A 10/29/2014   Procedure: LEFT HEART CATHETERIZATION WITH CORONARY ANGIOGRAM;  Surgeon: Erick JONELLE Bergamo, MD;  Location: [facility] CATH LAB;  Service: Cardiovascular;  Laterality: N/A;   LIGATION OF COMPETING BRANCHES OF ARTERIOVENOUS FISTULA Left 07/21/2020   Procedure: LIGATION OF COMPETING BRANCHES OF LEFT UPPER ARM ARTERIOVENOUS FISTULA;  Surgeon: Sheree Penne Bruckner, MD;  Location: [facility] OR;  Service: Vascular;  Laterality: Left;   LOWER EXTREMITY ANGIOGRAM N/A 03/19/2012   Procedure: LOWER EXTREMITY ANGIOGRAM;  Surgeon: Bruckner JONETTA Cash, MD;  Location: [facility] CATH LAB;  Service: Cardiovascular;   Laterality: N/A;   LOWER EXTREMITY ANGIOGRAPHY N/A 06/20/2021   Procedure: LOWER EXTREMITY ANGIOGRAPHY;  Surgeon: Elmira Newman PARAS, MD;  [facility];  Service: Cardiovascular;  Laterality: N/A;   NECK SURGERY     PARTIAL COLECTOMY N/A 01/13/2018   Procedure: LAPAROSCOPIC ASSISTED   SIGMOID COLECTOMY ILEOSTOMY;  Surgeon: Signe Mitzie LABOR, MD;  Location: [facility]S;  Service: General;  Laterality: N/A;   PENILE PROSTHESIS IMPLANT  08/14/2005   INFRAPUBIC INSERTION OF INFLATABLE PENILE PROSTHESIS; SURGEON DR. JANIT   PENILE PROSTHESIS IMPLANT     PERCUTANEOUS CORONARY STENT INTERVENTION (PCI-S) Right 10/29/2014   Procedure: PERCUTANEOUS CORONARY STENT INTERVENTION (PCI-S);  Surgeon: Erick JONELLE Bergamo, MD;  Location: [facility] CATH LAB;  Service: Cardiovascular;  Laterality: Right;   PERICARDIOCENTESIS N/A 02/13/2024   Procedure: PERICARDIOCENTESIS;  Surgeon: Elmira Newman PARAS, MD;  [facility];  Service: Cardiovascular;  Laterality: N/A;   PERIPHERAL VASCULAR INTERVENTION Left 06/23/2021   Procedure: PERIPHERAL VASCULAR INTERVENTION;  Surgeon: Elmira Newman PARAS, MD;  [facility];  Service: Cardiovascular;  Laterality: Left;   REMOVAL OF PENILE PROSTHESIS N/A 06/14/2021   Procedure: Removal of THREE piece inflatable penile prosthesis;  Surgeon: Carolee Sherwood JONETTA DOUGLAS, MD;  Location: [facility] OR;  Service: Urology;  Laterality: N/A;   SHOULDER ARTHROSCOPY     SPINE SURGERY     TEMPORARY PACEMAKER N/A 02/13/2024   Procedure: TEMPORARY PACEMAKER;  Surgeon: Elmira Newman PARAS, MD;  [facility];  Service: Cardiovascular;  Laterality: N/A;   TOE AMPUTATION Left    TONSILLECTOMY     TOTAL KNEE ARTHROPLASTY  07/2002   RIGHT KNEE ; SURGEON  DR. [name] ALSO HAD ARTHROSCOPIC RIGHT KNEE IN  10/2001   TOTAL KNEE ARTHROPLASTY     ULNAR NERVE TRANSPOSITION Right 10/21/2013   Procedure: RIGHT ELBOW  ULNAR NERVE DECOMPRESSION;  Surgeon: Arley JONELLE Curia,  MD;  Location: [facility];  Service: Orthopedics;  Laterality: Right;   Social History   Socioeconomic History   Marital status: Married    Spouse name: Not on file   Number of children: 3   Years of education: Not on file   Highest education level: Not on file  Occupational History   Occupation: TRUCK DRIVER    Employer: UNEMPLOYED  Tobacco Use   Smoking status: Every Day    Current packs/day: 1.00    Average packs/day: 1 pack/day for 35.0 years (35.0 ttl pk-yrs)    Types: Cigarettes   Smokeless tobacco: Never  Vaping Use  Vaping status: Every Day   Substances: Nicotine   Substance and Sexual Activity   Alcohol  use: Not Currently    Comment: not in a long time   Drug use: Never   Sexual activity: Yes    Birth control/protection: Implant    Comment: penile implant  Other Topics Concern   Not on file  Social History Narrative   ** Merged History Encounter **       HEAVY SMOKER AND CONTINUES TO SMOKE 1 PPD. DOES NOT EXERCISE REGULARLY.  Wife [phone] Jeryl Curly). Has 2 sons and daughter. Still active       Social Drivers of Health   Tobacco Use: High Risk (09/18/2024)   Patient History    Smoking Tobacco Use: Every Day    Smokeless Tobacco Use: Never    Passive Exposure: Not on file  Financial Resource Strain: Not on file  Food Insecurity: No Food Insecurity (03/25/2024)   Epic    Worried About Programme Researcher, Broadcasting/film/video in the Last Year: Never true    Ran Out of Food in the Last Year: Never true  Transportation Needs: No Transportation Needs (03/25/2024)   Epic    Lack of Transportation (Medical): No    Lack of Transportation (Non-Medical): No  Physical Activity: Not on file  Stress: Not on file  Social Connections: Moderately Isolated (03/25/2024)   Social Connection and Isolation Panel    Frequency of Communication with Friends and Family: More than three times a week    Frequency of Social Gatherings with Friends and Family: More than three  times a week    Attends Religious Services: Never    Database Administrator or Organizations: No    Attends Banker Meetings: Never    Marital Status: Married  Depression (PHQ2-9): Not on file  Alcohol  Screen: Not on file  Housing: Low Risk (03/25/2024)   Epic    Unable to Pay for Housing in the Last Year: No    Number of Times Moved in the Last Year: 0    Homeless in the Last Year: No  Utilities: Not At Risk (03/25/2024)   Epic    Threatened with loss of utilities: No  Health Literacy: Not on file   Family History  Problem Relation Age of Onset   Heart disease Father        Before age 65-  CAD, BPG   Diabetes Father        Amputation   Cancer Father        PROSTATE   Hyperlipidemia Father    Hypertension Father    Heart attack Father        Triple BPG   Varicose Veins Father    Cancer Sister        Breast   Hyperlipidemia Sister    Hypertension Sister    Heart attack Brother    Colon cancer Brother    Diabetes Brother    Heart disease Brother 43       A-Fib. Before age 48   Hyperlipidemia Brother    Hypertension Brother    Hypertension Son    Arthritis Other        GRANDMOTHER   Hypertension Other        OTHER FAMILY MEMBERS   - negative except otherwise stated in the family history section Allergies[1] Prior to Admission medications  Medication Sig Start Date End Date Taking? Authorizing Provider  apixaban  (ELIQUIS ) 5 MG TABS tablet Take 1 tablet (5 mg  total) by mouth 2 (two) times daily. 01/14/22 05/10/25 Yes Gonfa, Taye T, MD  atorvastatin  (LIPITOR ) 80 MG tablet Take 1 tablet (80 mg total) by mouth at bedtime. 11/25/23  Yes Amoako, Prince, MD  barrier cream (NON-SPECIFIED) CREA Apply 1 Application topically 2 (two) times daily as needed (incontinence). Apply a thin layer topically every day and night shift for protection   Yes [provider]  carvedilol  (COREG ) 6.25 MG tablet Take 1 tablet (6.25 mg total) by mouth 2 (two) times daily with a  meal. 09/02/24  Yes Samtani, Jai-Gurmukh, MD  colchicine  0.6 MG tablet Take 0.5 tablets (0.3 mg total) by mouth daily. Patient taking differently: Take 0.3 mg by mouth at bedtime. 02/21/24  Yes Gonfa, Taye T, MD  ethyl chloride spray Apply 1 Application topically daily. 09/14/24  Yes [provider]  famotidine  (PEPCID ) 20 MG tablet Take 20 mg by mouth See admin instructions. Give 1 tablet (20mg ) by mouth every other morning.   Yes [provider]  fentaNYL  (DURAGESIC ) 12 MCG/HR Place 1 patch onto the skin every 3 (three) days. 09/04/24  Yes Samtani, Jai-Gurmukh, MD  ferrous sulfate  325 (65 FE) MG tablet Take 325 mg by mouth in the morning.   Yes [provider]  folic acid  (FOLVITE ) 1 MG tablet Take 1 mg by mouth at bedtime. 09/18/21  Yes [provider]  folic acid -vitamin b complex-vitamin c -selenium-zinc  (DIALYVITE ) 3 MG TABS tablet Take 1 tablet by mouth at bedtime.   Yes [provider]  gabapentin  (NEURONTIN ) 100 MG capsule Take 100 mg by mouth 3 (three) times daily.   Yes [provider]  insulin  aspart (NOVOLOG  FLEXPEN) 100 UNIT/ML FlexPen Inject 4-16 Units into the skin See admin instructions. Inject 4 units subcutaneously three times daily before meals, in ADDITION to 0-12 units per sliding scale: 70 - 150 : 0 units 151-200 : 2 units 201-250 : 4 units 251-300 : 6 units 301-350 : 8 units 351-400 : 10 units 401-450 : 12 units > 400 : give 12 units and recheck in 1 hour - if still > 400, call MD/NP for further orders.   Yes [provider]  oxyCODONE  (OXY IR/ROXICODONE ) 5 MG immediate release tablet Take 1 tablet (5 mg total) by mouth every 6 (six) hours as needed for moderate pain (pain score 4-6) or severe pain (pain score 7-10). 09/02/24  Yes Samtani, Jai-Gurmukh, MD  Phenylephrine  HCl (PREPARATION H) 0.25 % SUPP Place 1 suppository rectally every 6 (six) hours as needed (hemorrhoids).   Yes [provider]   polyethylene glycol (MIRALAX  / GLYCOLAX ) 17 g packet Take 17 g by mouth daily as needed for moderate constipation. 09/02/24  Yes Samtani, Jai-Gurmukh, MD  rOPINIRole  (REQUIP ) 2 MG tablet Take 2 mg by mouth at bedtime.   Yes [provider]  senna (SENOKOT) 8.6 MG TABS tablet Take 17.2 mg by mouth at bedtime.   Yes [provider]  sevelamer  carbonate (RENVELA ) 800 MG tablet Take 1 tablet (800 mg total) by mouth 3 (three) times daily with meals. 09/02/24  Yes Samtani, Jai-Gurmukh, MD  tamsulosin  (FLOMAX ) 0.4 MG CAPS capsule Take 0.4 mg by mouth at bedtime.   Yes [provider]  Wound Cleansers (VASHE WOUND) SOLN Apply 1 Application topically daily. Apply to left middle finger topically evert day shift for cleansing, and pat dry   Yes [provider]   No results found. - Positive ROS: All other systems have been reviewed and were otherwise negative  with the exception of those mentioned in the HPI and as above.  Physical Exam: Patient is awake and alert this morning, able to answer questions appropriately Seen with nursing at bedside  Left upper Extremity  Left hand with notable necrotic portion of the distal phalanx of the ring finger, moderate erythema over the middle phalanx region with poor vascularity  Dorsal skin breakdown over the long finger dorsally at the P2 level, no evidence of erythema or drainage    Assessment: 69 year old male with left ring finger dry gangrene and necrotic distal tip  Plan: Given the necrotic nature of the digit distally, patient is indicated for left ring finger partial amputation.  I did explain that we would need to amputate back to the appropriate level for wound healing, this would likely be to the PIP level.  He currently receives dialysis through a left upper extremity fistula, for this reason we will avoid tourniquet control for bleeding.  Please hold Eliquis  for 48 hours preoperatively to help minimize blood  loss.  Will work around his dialysis schedule, will plan for left ring finger partial amputation on Wednesday.  Please make n.p.o. midnight Tuesday.  Will also require medical clearance for operating room  Thank you for the consult and the opportunity to see Mr. Anitra Buster Estela) Arlinda, M.D. [facility]         [1]  Allergies Allergen Reactions   Contrast Media [Iodinated Contrast Media] Shortness Of Breath and Other (See Comments)    Altered mental status     Ivp Dye [Iodinated Contrast Media] Anaphylaxis, Shortness Of Breath and Other (See Comments)    Altered mental status     Latex Rash    severe rash appears after the first 24 hours of being placed   Tape Rash and Other (See Comments)    Rash after 1 day of use  Do not leave on longer then 3 days without changing   "

## 2024-09-21 NOTE — Progress Notes (Signed)
" °  [facility] Progress Note    Assessment/ Plan:   Acute encephalopathy - resolved.  Likely 2/2 PNA and bacteremia.   PNA - in LLL on CXR.  On IV ceftriaxone  and PO azithromycin   Bacteremia - blood cultures positive for proteus. On IV ceftriaxone  and PO azithromycin   Left ring finger dry gangrene- ortho following, partial amputation planned for this coming Wed  ESRD - on HD TTS. Next HD on 09/22/24.  Anemia of CKD- Hgb 9.6. No indication for ESA yet  Secondary hyperparathyroidism - Ca in goal. Phos elevated. Continue binders.  HTN/volume - Blood pressure in goal. Does not appear volume overloaded.  UF as tolerated.   Nutrition - Renal diet w/fluid restrictions.  OP Dialysis Orders:  [facility]   B450   106kg  2K bath  AVF   Hep none Last OP HD 1/06, post wt 106kg Missed HD 1/08  Subjective:   Patient seen in room. No complaints, no acute events.   Objective:   BP 99/85 (BP Location: Right Arm)   Pulse 96   Temp 98 F (36.7 C) (Oral)   Resp 20   Ht 6' 2 (1.88 m) Comment: preamputation  Wt 111.5 kg   SpO2 97%   BMI 31.56 kg/m   Intake/Output Summary (Last 24 hours) at 09/21/2024 0940 Last data filed at 09/20/2024 1600 Gross per 24 hour  Intake 120 ml  Output --  Net 120 ml   Weight change: -2.4 kg  Physical Exam: Gen: NAD CVS: irreg irreg Resp: CTA BL Jai:dnqu, nt Ext: b/l BKA, no sig edema, dry gangrene left ring finger Neuro: awake Dialysis access: LUE AVF +b/t  Imaging: No results found.  Labs: BMET Recent Labs  Lab 09/18/24 2019 09/19/24 0336 09/19/24 0345 09/20/24 0247 09/21/24 0205  NA 128* 132* 130* 135 132*  K 6.3* 5.8* 5.6* 4.4 4.6  CL 88* 90*  --  92* 90*  CO2 24 21*  --  25 24  GLUCOSE 148* 121*  --  108* 148*  BUN 69* 71*  --  51* 68*  CREATININE 7.47* 7.74*  --  6.39* 7.05*  CALCIUM  8.7* 8.6*  --  8.7* 8.1*  PHOS  --  6.8*  --   --   --    CBC Recent Labs  Lab 09/18/24 2019 09/19/24 0336  09/19/24 0345 09/21/24 0205  WBC 11.8* 8.6  --  6.1  NEUTROABS 9.6* 6.8  --   --   HGB 11.2* 10.0* 10.9* 9.6*  HCT 35.7* 30.9* 32.0* 30.1*  MCV 89.9 88.3  --  87.2  PLT 139* 118*  --  146*    Medications:     atorvastatin   80 mg Oral QHS   azithromycin   500 mg Oral Q1200   Chlorhexidine  Gluconate Cloth  6 each Topical Q0600   collagenase    Topical Daily   ferrous sulfate   325 mg Oral Q breakfast   insulin  aspart  0-6 Units Subcutaneous TID WC   sevelamer  carbonate  800 mg Oral TID WC   sodium zirconium cyclosilicate   10 g Oral Daily      Ephriam Stank, MD [facility] 09/21/2024, 9:40 AM   "

## 2024-09-22 DIAGNOSIS — G9341 Metabolic encephalopathy: Secondary | ICD-10-CM | POA: Diagnosis not present

## 2024-09-22 LAB — APTT
aPTT: 55 s — ABNORMAL HIGH (ref 24–36)
aPTT: 47 s — ABNORMAL HIGH (ref 24–36)

## 2024-09-22 LAB — CBC
HCT: 34.5 % — ABNORMAL LOW (ref 39.0–52.0)
Hemoglobin: 11 g/dL — ABNORMAL LOW (ref 13.0–17.0)
MCH: 27.7 pg (ref 26.0–34.0)
MCHC: 31.9 g/dL (ref 30.0–36.0)
MCV: 86.9 fL (ref 80.0–100.0)
Platelets: 175 K/uL (ref 150–400)
RBC: 3.97 MIL/uL — ABNORMAL LOW (ref 4.22–5.81)
RDW: 18.3 % — ABNORMAL HIGH (ref 11.5–15.5)
WBC: 7.5 K/uL (ref 4.0–10.5)
nRBC: 0 % (ref 0.0–0.2)

## 2024-09-22 LAB — CULTURE, BLOOD (ROUTINE X 2): Special Requests: ADEQUATE

## 2024-09-22 LAB — GLUCOSE, CAPILLARY
Glucose-Capillary: 146 mg/dL — ABNORMAL HIGH (ref 70–99)
Glucose-Capillary: 151 mg/dL — ABNORMAL HIGH (ref 70–99)
Glucose-Capillary: 168 mg/dL — ABNORMAL HIGH (ref 70–99)

## 2024-09-22 LAB — HEPARIN LEVEL (UNFRACTIONATED): Heparin Unfractionated: >1.1 [IU]/mL — ABNORMAL HIGH (ref 0.30–0.70)

## 2024-09-22 MED ORDER — SODIUM CHLORIDE 0.9 % IV SOLN
2.0000 g | Freq: Two times a day (BID) | INTRAVENOUS | Status: DC
  Administered 2024-09-22 – 2024-09-23 (×3): 2 g via INTRAVENOUS
  Filled 2024-09-22 (×4): qty 2000

## 2024-09-22 NOTE — Progress Notes (Signed)
 " [facility] Progress Note   Subjective:    Seen and examined patient at bedside. No issues. Plan for HD today.  Objective Vitals:   09/21/24 1815 09/21/24 2023 09/22/24 0546 09/22/24 0819  BP: 106/64 (!) 119/52 119/67 95/66  Pulse: 93 77 85 98  Resp: 19 17 18 19   Temp: 100 F (37.8 C) 98.5 F (36.9 C) 98.5 F (36.9 C) 98 F (36.7 C)  TempSrc:  Oral Oral   SpO2: 98% 99% 97% 94%  Weight:      Height:       Physical Exam General: Awake, alert, NAD Heart: S1 and S2; No murmurs, gallops, or rubs Lungs: Clear anteriorly Abdomen: Soft and non-tender Extremities: B/l BKAs Dialysis Access: L AVF   Filed Weights   09/19/24 1701 09/20/24 0418 09/21/24 0500  Weight: 111.9 kg 108.7 kg 111.5 kg    Intake/Output Summary (Last 24 hours) at 09/22/2024 1109 Last data filed at 09/22/2024 0600 Gross per 24 hour  Intake 744.24 ml  Output 0 ml  Net 744.24 ml    Additional Objective Labs: Basic Metabolic Panel: Recent Labs  Lab 09/19/24 0336 09/19/24 0345 09/20/24 0247 09/21/24 0205  NA 132* 130* 135 132*  K 5.8* 5.6* 4.4 4.6  CL 90*  --  92* 90*  CO2 21*  --  25 24  GLUCOSE 121*  --  108* 148*  BUN 71*  --  51* 68*  CREATININE 7.74*  --  6.39* 7.05*  CALCIUM  8.6*  --  8.7* 8.1*  PHOS 6.8*  --   --   --    Liver Function Tests: Recent Labs  Lab 09/18/24 2019 09/19/24 0336  AST 21 19  ALT 15 14  ALKPHOS 157* 135*  BILITOT 0.7 0.7  PROT 6.4* 5.9*  ALBUMIN  3.8 3.5   No results for input(s): LIPASE, AMYLASE in the last 168 hours. CBC: Recent Labs  Lab 09/18/24 2019 09/19/24 0336 09/19/24 0345 09/21/24 0205  WBC 11.8* 8.6  --  6.1  NEUTROABS 9.6* 6.8  --   --   HGB 11.2* 10.0* 10.9* 9.6*  HCT 35.7* 30.9* 32.0* 30.1*  MCV 89.9 88.3  --  87.2  PLT 139* 118*  --  146*   Blood Culture    Component Value Date/Time   SDES BLOOD RIGHT ARM 09/18/2024 2019   SPECREQUEST  09/18/2024 2019    BOTTLES DRAWN AEROBIC AND ANAEROBIC Blood Culture  adequate volume   CULT PROTEUS MIRABILIS (A) 09/18/2024 2019   REPTSTATUS 09/22/2024 FINAL 09/18/2024 2019    Cardiac Enzymes: No results for input(s): CKTOTAL, CKMB, CKMBINDEX, TROPONINI in the last 168 hours. CBG: Recent Labs  Lab 09/21/24 1122 09/21/24 1627 09/21/24 1818 09/21/24 2023 09/22/24 0819  GLUCAP 183* 180* 222* 199* 146*   Iron Studies: No results for input(s): IRON, TIBC, TRANSFERRIN, FERRITIN in the last 72 hours. Lab Results  Component Value Date   INR 2.4 (H) 09/19/2024   INR 2.2 (H) 09/18/2024   INR 1.3 (H) 08/25/2024   Studies/Results: No results found.  Medications:  ampicillin  (OMNIPEN) IV     heparin  1,400 Units/hr (09/22/24 0736)    atorvastatin   80 mg Oral QHS   Chlorhexidine  Gluconate Cloth  6 each Topical Q0600   collagenase    Topical Daily   ferrous sulfate   325 mg Oral Q breakfast   insulin  aspart  0-6 Units Subcutaneous TID WC   sevelamer  carbonate  800 mg Oral TID WC   sodium zirconium cyclosilicate   10 g Oral Daily    Dialysis Orders: [facility]   B450   106kg  2K bath  AVF   Hep none Last OP HD 1/06, post wt 106kg  Assessment/Plan:  Acute encephalopathy - resolved.  Likely 2/2 PNA and bacteremia.    PNA - in LLL on CXR.  On IV ceftriaxone  and PO azithromycin    Bacteremia - blood cultures positive for proteus. On IV ceftriaxone  and PO azithromycin    Left ring finger dry gangrene- ortho following, partial amputation planned for this coming Wed   ESRD - on HD TTS. Next HD this afternoon.   Anemia of CKD- Hgb 9.6. No indication for ESA yet   Secondary hyperparathyroidism - Ca in goal. Phos elevated. Continue binders.   HTN/volume - Blood pressure in goal. Does not appear volume overloaded.  UF as tolerated.    Nutrition - Renal diet w/fluid restrictions.  Charmaine Piety, NP [facility] 09/22/2024,11:09 AM  LOS: 4 days    "

## 2024-09-22 NOTE — Progress Notes (Signed)
 PHARMACY - ANTICOAGULATION CONSULT NOTE  Pharmacy Consult for  apixaban  > heparin   Indication: atrial fibrillation  Allergies[1]  Patient Measurements: Height: 6' 2 (188 cm) (preamputation) Weight: 111.5 kg (245 lb 13 oz) IBW/kg (Calculated) : 82.2 HEPARIN  DW (KG): 105.5  Vital Signs: Temp: 98 F (36.7 C) (01/13 0819) Temp Source: Oral (01/13 0546) BP: 95/66 (01/13 0819) Pulse Rate: 98 (01/13 0819)  Labs: Recent Labs    09/20/24 0247 09/21/24 0205 09/21/24 2137 09/22/24 1114  HGB  --  9.6*  --  11.0*  HCT  --  30.1*  --  34.5*  PLT  --  146*  --  175  APTT  --   --  54* 55*  HEPARINUNFRC  --   --  >1.10* >1.10*  CREATININE 6.39* 7.05*  --   --     Estimated Creatinine Clearance: 12.9 mL/min (A) (by C-G formula based on SCr of 7.05 mg/dL (H)).   Assessment: 69 yo M with atrial fibrillation on apixaban  pta  Has gangrenous finger with plan for amputation 1/14 Hold apixaban  - last dose 1/11 pm Bridge with heparin  drip pre/post op  Cbc stable   Heparin  level >1.1 (Affected by Eliquis  as expected), aPTT 55 sec (subtherapeutic) on infusion at 1400 units/hr. No issues with bleeding reported per RN.  IV out ~ around 5AM therefor daily level delayed until noon.    Goal of Therapy:  aPTT 66-102 sec  Heparin  level 0.3-0.7 units/ml Monitor platelets by anticoagulation protocol: Yes   Plan:  Increase heparin  drip to 1600 units/hr  F/u 8h aPTT F/u with orthopedics regarding when to hold heparin  gtt pre-OR on 1/14 Reached out to Dr. Arlinda to clarify.   [employer], Pharm.D., BCPS Clinical Pharmacist Clinical phone for 09/22/2024 from 7:30-3:00 is [phone].  **Pharmacist phone directory can be found on [email or web address] listed under [facility].  09/22/2024 1:14 PM        [1]  Allergies Allergen Reactions   Contrast Media [Iodinated Contrast Media] Shortness Of Breath and Other (See Comments)    Altered mental status     Ivp Dye [Iodinated Contrast  Media] Anaphylaxis, Shortness Of Breath and Other (See Comments)    Altered mental status     Latex Rash    severe rash appears after the first 24 hours of being placed   Tape Rash and Other (See Comments)    Rash after 1 day of use  Do not leave on longer then 3 days without changing

## 2024-09-22 NOTE — Progress Notes (Signed)
 PHARMACY - ANTICOAGULATION CONSULT NOTE  Pharmacy Consult for  apixaban  > heparin   Indication: atrial fibrillation  Allergies[1]  Patient Measurements: Height: 6' 2 (188 cm) (preamputation) Weight: 111.5 kg (245 lb 13 oz) IBW/kg (Calculated) : 82.2 HEPARIN  DW (KG): 105.5  Vital Signs: Temp: 98.5 F (36.9 C) (01/13 2146) Temp Source: Oral (01/13 2146) BP: 110/70 (01/13 2146) Pulse Rate: 88 (01/13 2146)  Labs: Recent Labs    09/20/24 0247 09/21/24 0205 09/21/24 2137 09/22/24 1114 09/22/24 2133  HGB  --  9.6*  --  11.0*  --   HCT  --  30.1*  --  34.5*  --   PLT  --  146*  --  175  --   APTT  --   --  54* 55* 47*  HEPARINUNFRC  --   --  >1.10* >1.10*  --   CREATININE 6.39* 7.05*  --   --   --     Estimated Creatinine Clearance: 12.9 mL/min (A) (by C-G formula based on SCr of 7.05 mg/dL (H)).   Assessment: 69 yo M with atrial fibrillation on apixaban  pta  Has gangrenous finger with plan for amputation 1/14 Hold apixaban  - last dose 1/11 pm Bridge with heparin  drip pre/post op  Cbc stable   aPTT 47 sec (subtherapeutic) on infusion at 1600 units/hr. No issues with line or bleeding reported per RN.  Goal of Therapy:  aPTT 66-102 sec  Heparin  level 0.3-0.7 units/ml Monitor platelets by anticoagulation protocol: Yes   Plan:  Increase heparin  drip to 1800 units/hr  F/u 8h aPTT - will try to draw 0530 prior to heparin  gtt being turned off Heparin  to be held 1/14 0600  Vito Ralph, PharmD, BCPS Please see amion for complete clinical pharmacist phone list 09/22/2024 10:04 PM         [1]  Allergies Allergen Reactions   Contrast Media [Iodinated Contrast Media] Shortness Of Breath and Other (See Comments)    Altered mental status     Ivp Dye [Iodinated Contrast Media] Anaphylaxis, Shortness Of Breath and Other (See Comments)    Altered mental status     Latex Rash    severe rash appears after the first 24 hours of being placed   Tape Rash and Other (See  Comments)    Rash after 1 day of use  Do not leave on longer then 3 days without changing

## 2024-09-22 NOTE — Progress Notes (Signed)
 " PROGRESS NOTE  AARYA QUEBEDEAUX  FMW:[id number] DOB: [birth year] DOA: 09/18/2024 PCP: Montey Lot, PA-C   Brief Narrative: Patient is a 69 year old male with history of bilateral BKA, chronic A-fib on Eliquis , ESRD on dialysis on TTS schedule, insulin -dependent diabetes type 2, hypertension, moderate aortic stenosis, chronic pain syndrome, chronic right-sided systolic heart failure who presented with complaint of confusion, fever.  On presentation he was febrile, maintaining saturation on room air.  Lab work showed sodium of 128, potassium 6.3, lactate of 1.4, WBC count of 11.8.  COVID/flu/RSV negative.  EKG showed atrial fibrillation.  Chest x-ray showed left lower lobe airspace opacities concerning for pneumonia, new small-moderate left pleural effusion.  Nephrology consulted for dialysis.  Currently being managed for  pneumonia/ Proteus bacteremia.  Sepsis physiology has resolved.  Currently afebrile, on room air, alert and oriented.  Hand surgery planning for amputation of gangrenous left ring finger Wednesday.  Assessment & Plan:  Principal Problem:   Acute metabolic encephalopathy Active Problems:   Atrial fibrillation, chronic (HCC)   History of anemia due to chronic kidney disease   End-stage renal disease on hemodialysis (HCC)   Severe sepsis (HCC)   DM2 (diabetes mellitus, type 2) (HCC)   HLD (hyperlipidemia)   Acute hyponatremia   Tobacco abuse   Hyperkalemia   Moderate aortic stenosis   HCAP (healthcare-associated pneumonia)   Fever   Dry gangrene (HCC)   Acute metabolic encephalopathy: Usually alert and oriented.  Presented with confusion, fever.  Takes fentanyl  patch, oxycodone , gabapentin .  Polypharmacy could also be possible.  MRI done on 12/18 showed chronic encephalomalacia changes in the right parietal lobe and to a lesser extent the right frontal lobe.Moderately advanced cerebral white matter disease bilaterally. TSH, vitamin B12 optimal.  Mildly elevated ammonia level  of 41, we do not think this is contributing to his confusion. Confusion likely from metabolic encephalopathy from sepsis secondary to pneumonia/bacteremia. He is mostly oriented now.  Sepsis secondary to pneumonia/HCAP/Proteus bacteremia: Presented with fever.  Chest x-ray showed left lower lobe opacity concerning for pneumonia.  Initial lactate was normal.  Blood culture showed Proteus.  Antibiotics changed to oral.  ESRD on dialysis/hyperkalemia: Nephrology following for dialysis.  Has AV fistula on the left upper extremity  Acute on chronic hypoosmolar hyponatremia: Volume management per dialysis.  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  The finger is black.  Plan for amputation on Wednesday  Chronic A-fib: Monitor on telemetry.  On Eliquis  which is currently on hold for upcoming surgery.  Takes Coreg  for rate control.  Started on IV heparin   Type 2 diabetes: Continue current insulin  regimen.  Monitor blood sugars.  Recent A1c of 6.9.  Hyperlipidemia: On Lipitor   History of moderate aortic stenosis: Patient echo showed EF of 50 to 55%, moderate aortic stenosis.  We recommend to follow-up with cardiology as an outpatient  Anemia chronic disease: Currently hemoglobin stable  Chronic tobacco use: Continues to smoke.  Counseled cessation.  Continue current patch  Debility/deconditioning: Patient residing at [facility].  Patient has bilateral BKA     Wound 08/25/24 2047 Pressure Injury Sacrum Stage 2 -  Partial thickness loss of dermis presenting as a shallow open injury with a red, pink wound bed without slough. (Active)     Wound 09/19/24 1735 Pressure Injury Thigh Left;Posterior Stage 1 -  Intact skin with non-blanchable redness of a localized area usually over a bony prominence. (Active)    DVT prophylaxis:  Code Status: Full Code  Family Communication: Wife on phone on 1/12  Patient status: Inpatient  Patient is from :  SNF  Anticipated discharge to: SNF  Estimated DC date: Thursday or Friday   Consultants: Nephrology  Procedures: Dialysis  Antimicrobials:  Anti-infectives (From admission, onward)    Start     Dose/Rate Route Frequency Ordered Stop   09/22/24 2200  ampicillin  (OMNIPEN) 2 g in sodium chloride  0.9 % 100 mL IVPB        2 g 300 mL/hr over 20 Minutes Intravenous Every 12 hours 09/22/24 0953 09/28/24 2159   09/19/24 2200  ceFEPIme  (MAXIPIME ) 1 g in sodium chloride  0.9 % 100 mL IVPB  Status:  Discontinued        1 g 200 mL/hr over 30 Minutes Intravenous Every 24 hours 09/18/24 2230 09/19/24 1005   09/19/24 2000  cefTRIAXone  (ROCEPHIN ) 2 g in sodium chloride  0.9 % 100 mL IVPB  Status:  Discontinued        2 g 200 mL/hr over 30 Minutes Intravenous Every 24 hours 09/19/24 1005 09/22/24 0953   09/19/24 1200  azithromycin  (ZITHROMAX ) tablet 500 mg        500 mg Oral Daily 09/19/24 1005 09/21/24 1217   09/18/24 1945  vancomycin  (VANCOREADY) IVPB 2000 mg/400 mL        2,000 mg 200 mL/hr over 120 Minutes Intravenous  Once 09/18/24 1938 09/18/24 2334   09/18/24 1930  ceFEPIme  (MAXIPIME ) 2 g in sodium chloride  0.9 % 100 mL IVPB        2 g 200 mL/hr over 30 Minutes Intravenous  Once 09/18/24 1928 09/18/24 2052   09/18/24 1930  metroNIDAZOLE  (FLAGYL ) IVPB 500 mg        500 mg 100 mL/hr over 60 Minutes Intravenous  Once 09/18/24 1928 09/18/24 2132   09/18/24 1930  vancomycin  (VANCOCIN ) IVPB 1000 mg/200 mL premix  Status:  Discontinued        1,000 mg 200 mL/hr over 60 Minutes Intravenous  Once 09/18/24 1928 09/18/24 1938       Subjective: Patient seen and examined at bedside today.  Overall comfortable.  Lying in bed.  Hemodynamically stable.  Afebrile.  Mentation continues to remain stable.  Plan for amputation of left ring finger tomorrow.  No new complaints.  Objective: Vitals:   09/21/24 1815 09/21/24 2023 09/22/24 0546 09/22/24 0819  BP: 106/64 (!) 119/52 119/67 95/66  Pulse: 93 77  85 98  Resp: 19 17 18 19   Temp: 100 F (37.8 C) 98.5 F (36.9 C) 98.5 F (36.9 C) 98 F (36.7 C)  TempSrc:  Oral Oral   SpO2: 98% 99% 97% 94%  Weight:      Height:        Intake/Output Summary (Last 24 hours) at 09/22/2024 1129 Last data filed at 09/22/2024 0600 Gross per 24 hour  Intake 744.24 ml  Output 0 ml  Net 744.24 ml   Filed Weights   09/19/24 1701 09/20/24 0418 09/21/24 0500  Weight: 111.9 kg 108.7 kg 111.5 kg    Examination:   General exam: Overall comfortable, not in distress, chronically deconditioned, weak HEENT: PERRL Respiratory system:  no wheezes or crackles  Cardiovascular system: Irregularly irregular rhythm Gastrointestinal system: Abdomen is nondistended, soft and nontender. Central nervous system: Alert and oriented Extremities: No edema, no clubbing ,no cyanosis, a fistula on the left upper extremity, bilateral BKA, dry gangrene of the left ring finger Skin: No rashes, no ulcers,no icterus  Data Reviewed: I have personally reviewed following labs and imaging studies  CBC: Recent Labs  Lab 09/18/24 2019 09/19/24 0336 09/19/24 0345 09/21/24 0205  WBC 11.8* 8.6  --  6.1  NEUTROABS 9.6* 6.8  --   --   HGB 11.2* 10.0* 10.9* 9.6*  HCT 35.7* 30.9* 32.0* 30.1*  MCV 89.9 88.3  --  87.2  PLT 139* 118*  --  146*   Basic Metabolic Panel: Recent Labs  Lab 09/18/24 2019 09/19/24 0336 09/19/24 0345 09/19/24 2040 09/20/24 0247 09/21/24 0205  NA 128* 132* 130*  --  135 132*  K 6.3* 5.8* 5.6*  --  4.4 4.6  CL 88* 90*  --   --  92* 90*  CO2 24 21*  --   --  25 24  GLUCOSE 148* 121*  --   --  108* 148*  BUN 69* 71*  --   --  51* 68*  CREATININE 7.47* 7.74*  --   --  6.39* 7.05*  CALCIUM  8.7* 8.6*  --   --  8.7* 8.1*  MG  --  2.2  --  2.0  --   --   PHOS  --  6.8*  --   --   --   --      Recent Results (from the past 240 hours)  Resp panel by RT-PCR (RSV, Flu A&B, Covid) Anterior Nasal Swab     Status: None   Collection Time: 09/18/24   7:54 PM   Specimen: Anterior Nasal Swab  Result Value Ref Range Status   SARS Coronavirus 2 by RT PCR NEGATIVE NEGATIVE Final   Influenza A by PCR NEGATIVE NEGATIVE Final   Influenza B by PCR NEGATIVE NEGATIVE Final    Comment: (NOTE) The Xpert Xpress SARS-CoV-2/FLU/RSV plus assay is intended as an aid in the diagnosis of influenza from Nasopharyngeal swab specimens and should not be used as a sole basis for treatment. Nasal washings and aspirates are unacceptable for Xpert Xpress SARS-CoV-2/FLU/RSV testing.  Fact Sheet for Patients: bloggercourse.com  Fact Sheet for Healthcare Providers: seriousbroker.it  This test is not yet approved or cleared by the [place]  FDA and has been authorized for detection and/or diagnosis of SARS-CoV-2 by FDA under an Emergency Use Authorization (EUA). This EUA will remain in effect (meaning this test can be used) for the duration of the COVID-19 declaration under Section 564(b)(1) of the Act, 21 U.S.C. section 360bbb-3(b)(1), unless the authorization is terminated or revoked.     Resp Syncytial Virus by PCR NEGATIVE NEGATIVE Final    Comment: (NOTE) Fact Sheet for Patients: bloggercourse.com  Fact Sheet for Healthcare Providers: seriousbroker.it  This test is not yet approved or cleared by the [place]  FDA and has been authorized for detection and/or diagnosis of SARS-CoV-2 by FDA under an Emergency Use Authorization (EUA). This EUA will remain in effect (meaning this test can be used) for the duration of the COVID-19 declaration under Section 564(b)(1) of the Act, 21 U.S.C. section 360bbb-3(b)(1), unless the authorization is terminated or revoked.  Performed at [facility], [address] [place], [place] [address]   Blood Culture (routine x 2)     Status: Abnormal   Collection Time: 09/18/24  8:19 PM   Specimen: BLOOD  RIGHT ARM  Result Value Ref Range Status   Specimen Description BLOOD RIGHT ARM  Final   Special Requests   Final    BOTTLES DRAWN AEROBIC AND ANAEROBIC Blood Culture adequate volume  Culture  Setup Time   Final    GRAM NEGATIVE RODS AEROBIC BOTTLE ONLY CRITICAL RESULT CALLED TO, READ BACK BY AND VERIFIED WITH: PHARMD J LEDFORD 09/19/2024 @ 2351 BY AB Performed at [facility], [address] [place], [place] [address]    Culture PROTEUS MIRABILIS (A)  Final   Report Status 09/22/2024 FINAL  Final   Organism ID, Bacteria PROTEUS MIRABILIS  Final      Susceptibility   Proteus mirabilis - MIC*    AMPICILLIN  <=2 SENSITIVE Sensitive     CEFAZOLIN  (NON-URINE) 4 INTERMEDIATE Intermediate     CEFEPIME  <=0.12 SENSITIVE Sensitive     ERTAPENEM <=0.12 SENSITIVE Sensitive     CEFTRIAXONE  <=0.25 SENSITIVE Sensitive     CIPROFLOXACIN  >=4 RESISTANT Resistant     GENTAMICIN  8 INTERMEDIATE Intermediate     MEROPENEM  <=0.25 SENSITIVE Sensitive     TRIMETH /SULFA  >=320 RESISTANT Resistant     AMPICILLIN /SULBACTAM <=2 SENSITIVE Sensitive     PIP/TAZO Value in next row Sensitive      <=4 SENSITIVEThis is a modified FDA-approved test that has been validated and its performance characteristics determined by the reporting laboratory.  This laboratory is certified under the Clinical Laboratory Improvement Amendments CLIA as qualified to perform high complexity clinical laboratory testing.    * PROTEUS MIRABILIS  Blood Culture ID Panel (Reflexed)     Status: Abnormal   Collection Time: 09/18/24  8:19 PM  Result Value Ref Range Status   Enterococcus faecalis NOT DETECTED NOT DETECTED Final   Enterococcus Faecium NOT DETECTED NOT DETECTED Final   Listeria monocytogenes NOT DETECTED NOT DETECTED Final   Staphylococcus species NOT DETECTED NOT DETECTED Final   Staphylococcus aureus (BCID) NOT DETECTED NOT DETECTED Final   Staphylococcus epidermidis NOT DETECTED NOT DETECTED Final   Staphylococcus  lugdunensis NOT DETECTED NOT DETECTED Final   Streptococcus species NOT DETECTED NOT DETECTED Final   Streptococcus agalactiae NOT DETECTED NOT DETECTED Final   Streptococcus pneumoniae NOT DETECTED NOT DETECTED Final   Streptococcus pyogenes NOT DETECTED NOT DETECTED Final   A.calcoaceticus-baumannii NOT DETECTED NOT DETECTED Final   Bacteroides fragilis NOT DETECTED NOT DETECTED Final   Enterobacterales DETECTED (A) NOT DETECTED Final    Comment: Enterobacterales represent a large order of gram negative bacteria, not a single organism. CRITICAL RESULT CALLED TO, READ BACK BY AND VERIFIED WITH: PHARMD J LEDFORD 09/19/2024 @ 2351 BY AB    Enterobacter cloacae complex NOT DETECTED NOT DETECTED Final   Escherichia coli NOT DETECTED NOT DETECTED Final   Klebsiella aerogenes NOT DETECTED NOT DETECTED Final   Klebsiella oxytoca NOT DETECTED NOT DETECTED Final   Klebsiella pneumoniae NOT DETECTED NOT DETECTED Final   Proteus species DETECTED (A) NOT DETECTED Final    Comment: CRITICAL RESULT CALLED TO, READ BACK BY AND VERIFIED WITH: PHARMD J LEDFORD 09/19/2024 @ 2351 BY AB    Salmonella species NOT DETECTED NOT DETECTED Final   Serratia marcescens NOT DETECTED NOT DETECTED Final   Haemophilus influenzae NOT DETECTED NOT DETECTED Final   Neisseria meningitidis NOT DETECTED NOT DETECTED Final   Pseudomonas aeruginosa NOT DETECTED NOT DETECTED Final   Stenotrophomonas maltophilia NOT DETECTED NOT DETECTED Final   Candida albicans NOT DETECTED NOT DETECTED Final   Candida auris NOT DETECTED NOT DETECTED Final   Candida glabrata NOT DETECTED NOT DETECTED Final   Candida krusei NOT DETECTED NOT DETECTED Final   Candida parapsilosis NOT DETECTED NOT DETECTED Final   Candida tropicalis NOT DETECTED NOT DETECTED  Final   Cryptococcus neoformans/gattii NOT DETECTED NOT DETECTED Final   CTX-M ESBL NOT DETECTED NOT DETECTED Final   Carbapenem resistance IMP NOT DETECTED NOT DETECTED Final    Carbapenem resistance KPC NOT DETECTED NOT DETECTED Final   Carbapenem resistance NDM NOT DETECTED NOT DETECTED Final   Carbapenem resist OXA 48 LIKE NOT DETECTED NOT DETECTED Final   Carbapenem resistance VIM NOT DETECTED NOT DETECTED Final    Comment: Performed at [facility], [address], [place], [place] [address]     Radiology Studies: No results found.   Scheduled Meds:  atorvastatin   80 mg Oral QHS   Chlorhexidine  Gluconate Cloth  6 each Topical Q0600   collagenase    Topical Daily   ferrous sulfate   325 mg Oral Q breakfast   insulin  aspart  0-6 Units Subcutaneous TID WC   sevelamer  carbonate  800 mg Oral TID WC   sodium zirconium cyclosilicate   10 g Oral Daily   Continuous Infusions:  ampicillin  (OMNIPEN) IV     heparin  1,400 Units/hr (09/22/24 0736)     LOS: 4 days   Ivonne Mustache, MD [facility] P1/13/2026, 11:29 AM  "

## 2024-09-22 NOTE — Care Management Important Message (Signed)
 Important Message  Patient Details  Name: [name] MRN: [id number] Date of Birth: 1955-10-31   Important Message Given:  Yes - Medicare IM     Claretta Deed 09/22/2024, 2:43 PM

## 2024-09-22 NOTE — Progress Notes (Signed)
" ° ° °  PROCEDURAL EXPEDITER PROGRESS NOTE  Patient Name: [name]  DOB:09-Sep-1956 Date of Admission: 09/18/2024  Date of Assessment:09/22/2024   -------------------------------------------------------------------------------------------------------------------   Brief clinical summary: pt  is a 69 yr old in pt having surgery on 09/23/24, for amputation of left finger  Orders in place:  No   Communication with surgical team if no orders: sent secure chat for orders  Labs, test, and orders reviewed: Yes  Requires surgical clearance:  No  What type of clearance: n/a  Clearance received: n/a  Barriers noted:n/a   Intervention provided by [facility] team: secure chat to surgeon for orders   Barrier resolved:  not applicable   -------------------------------------------------------------------------------------------------------------------  [name], [name] Please contact us  directly via secure chat (search for [facility]) or by calling us  at [phone] [facility] [address]).  "

## 2024-09-23 ENCOUNTER — Encounter (HOSPITAL_COMMUNITY)
Admission: EM | Disposition: A | Discharge status: Skilled Nursing Facility | Payer: Self-pay | Source: Home / Self Care | Attending: Internal Medicine

## 2024-09-23 ENCOUNTER — Encounter (HOSPITAL_COMMUNITY): Payer: Self-pay | Admitting: Anesthesiology

## 2024-09-23 ENCOUNTER — Inpatient Hospital Stay (HOSPITAL_COMMUNITY): Payer: Self-pay | Admitting: Anesthesiology

## 2024-09-23 ENCOUNTER — Encounter (HOSPITAL_COMMUNITY): Payer: Self-pay | Admitting: Internal Medicine

## 2024-09-23 DIAGNOSIS — E1152 Type 2 diabetes mellitus with diabetic peripheral angiopathy with gangrene: Secondary | ICD-10-CM

## 2024-09-23 DIAGNOSIS — I110 Hypertensive heart disease with heart failure: Secondary | ICD-10-CM

## 2024-09-23 DIAGNOSIS — G9341 Metabolic encephalopathy: Secondary | ICD-10-CM | POA: Diagnosis not present

## 2024-09-23 DIAGNOSIS — I96 Gangrene, not elsewhere classified: Secondary | ICD-10-CM | POA: Diagnosis not present

## 2024-09-23 DIAGNOSIS — F17210 Nicotine dependence, cigarettes, uncomplicated: Secondary | ICD-10-CM | POA: Diagnosis not present

## 2024-09-23 DIAGNOSIS — I5030 Unspecified diastolic (congestive) heart failure: Secondary | ICD-10-CM

## 2024-09-23 HISTORY — PX: AMPUTATION FINGER: SHX6594

## 2024-09-23 LAB — BASIC METABOLIC PANEL WITH GFR
Anion gap: 19 — ABNORMAL HIGH (ref 5–15)
BUN: 85 mg/dL — ABNORMAL HIGH (ref 8–23)
CO2: 22 mmol/L (ref 22–32)
Calcium: 8.6 mg/dL — ABNORMAL LOW (ref 8.9–10.3)
Chloride: 90 mmol/L — ABNORMAL LOW (ref 98–111)
Creatinine, Ser: 9.24 mg/dL — ABNORMAL HIGH (ref 0.61–1.24)
GFR, Estimated: 6 mL/min — ABNORMAL LOW
Glucose, Bld: 148 mg/dL — ABNORMAL HIGH (ref 70–99)
Potassium: 4.9 mmol/L (ref 3.5–5.1)
Sodium: 130 mmol/L — ABNORMAL LOW (ref 135–145)

## 2024-09-23 LAB — CBC
HCT: 29.9 % — ABNORMAL LOW (ref 39.0–52.0)
Hemoglobin: 9.7 g/dL — ABNORMAL LOW (ref 13.0–17.0)
MCH: 28.1 pg (ref 26.0–34.0)
MCHC: 32.4 g/dL (ref 30.0–36.0)
MCV: 86.7 fL (ref 80.0–100.0)
Platelets: 154 K/uL (ref 150–400)
RBC: 3.45 MIL/uL — ABNORMAL LOW (ref 4.22–5.81)
RDW: 18.1 % — ABNORMAL HIGH (ref 11.5–15.5)
WBC: 8.3 K/uL (ref 4.0–10.5)
nRBC: 0 % (ref 0.0–0.2)

## 2024-09-23 LAB — GLUCOSE, CAPILLARY
Glucose-Capillary: 254 mg/dL — ABNORMAL HIGH (ref 70–99)
Glucose-Capillary: 202 mg/dL — ABNORMAL HIGH (ref 70–99)
Glucose-Capillary: 142 mg/dL — ABNORMAL HIGH (ref 70–99)
Glucose-Capillary: 149 mg/dL — ABNORMAL HIGH (ref 70–99)
Glucose-Capillary: 140 mg/dL — ABNORMAL HIGH (ref 70–99)
Glucose-Capillary: 127 mg/dL — ABNORMAL HIGH (ref 70–99)
Glucose-Capillary: 119 mg/dL — ABNORMAL HIGH (ref 70–99)

## 2024-09-23 LAB — SURGICAL PCR SCREEN
MRSA, PCR: POSITIVE — AB
Staphylococcus aureus: POSITIVE — AB

## 2024-09-23 MED ORDER — PHENYLEPHRINE HCL-NACL 20-0.9 MG/250ML-% IV SOLN
INTRAVENOUS | Status: DC | PRN
  Administered 2024-09-23: 40 ug/min via INTRAVENOUS

## 2024-09-23 MED ORDER — PHENYLEPHRINE HCL (PRESSORS) 10 MG/ML IV SOLN
INTRAVENOUS | Status: DC | PRN
  Administered 2024-09-23: 160 ug via INTRAVENOUS

## 2024-09-23 MED ORDER — LACTATED RINGERS IV SOLN: INTRAVENOUS | Status: DC

## 2024-09-23 MED ORDER — LIDOCAINE HCL 1 % IJ SOLN
INTRAMUSCULAR | Status: AC
  Filled 2024-09-23: qty 20

## 2024-09-23 MED ORDER — LIDOCAINE HCL (PF) 1 % IJ SOLN
INTRAMUSCULAR | Status: DC | PRN
  Administered 2024-09-23: 10 mL

## 2024-09-23 MED ORDER — SODIUM CHLORIDE 0.9 % IV SOLN: INTRAVENOUS | Status: DC | PRN

## 2024-09-23 MED ORDER — DEXAMETHASONE SOD PHOSPHATE PF 10 MG/ML IJ SOLN
INTRAMUSCULAR | Status: DC | PRN
  Administered 2024-09-23: 5 mg via INTRAVENOUS

## 2024-09-23 MED ORDER — PROPOFOL 500 MG/50ML IV EMUL
INTRAVENOUS | Status: DC | PRN
  Administered 2024-09-23: 100 ug/kg/min via INTRAVENOUS

## 2024-09-23 MED ORDER — ALBUMIN HUMAN 25 % IV SOLN
INTRAVENOUS | Status: AC
  Filled 2024-09-23: qty 200

## 2024-09-23 MED ORDER — ONDANSETRON HCL 4 MG/2ML IJ SOLN
INTRAMUSCULAR | Status: DC | PRN
  Administered 2024-09-23: 4 mg via INTRAVENOUS

## 2024-09-23 MED ORDER — MUPIROCIN 2 % EX OINT
1.0000 | TOPICAL_OINTMENT | Freq: Two times a day (BID) | CUTANEOUS | Status: DC
  Administered 2024-09-23 – 2024-09-25 (×6): 1 via NASAL
  Filled 2024-09-23: qty 22

## 2024-09-23 MED ORDER — BUPIVACAINE HCL (PF) 0.25 % IJ SOLN
INTRAMUSCULAR | Status: AC
  Filled 2024-09-23: qty 30

## 2024-09-23 MED ORDER — VANCOMYCIN HCL 1500 MG/300ML IV SOLN
1500.0000 mg | INTRAVENOUS | Status: AC
  Administered 2024-09-23: 1500 mg via INTRAVENOUS
  Filled 2024-09-23: qty 300

## 2024-09-23 MED ORDER — HEPARIN SODIUM (PORCINE) 1000 UNIT/ML IJ SOLN
INTRAMUSCULAR | Status: AC
  Filled 2024-09-23: qty 3

## 2024-09-23 MED ORDER — ORAL CARE MOUTH RINSE: 15.0000 mL | Freq: Once | OROMUCOSAL | Status: AC

## 2024-09-23 MED ORDER — APIXABAN 5 MG PO TABS
5.0000 mg | ORAL_TABLET | Freq: Two times a day (BID) | ORAL | Status: DC
  Administered 2024-09-23 – 2024-09-25 (×4): 5 mg via ORAL
  Filled 2024-09-23 (×4): qty 1

## 2024-09-23 MED ORDER — HEPARIN SODIUM (PORCINE) 1000 UNIT/ML IJ SOLN
3000.0000 [IU] | Freq: Once | INTRAMUSCULAR | Status: AC
  Administered 2024-09-24: 3000 [IU]

## 2024-09-23 MED ORDER — CHLORHEXIDINE GLUCONATE CLOTH 2 % EX PADS
6.0000 | MEDICATED_PAD | Freq: Every day | CUTANEOUS | Status: DC
  Administered 2024-09-23 – 2024-09-25 (×3): 6 via TOPICAL

## 2024-09-23 MED ORDER — CHLORHEXIDINE GLUCONATE 0.12 % MT SOLN: 15.0000 mL | Freq: Once | OROMUCOSAL | Status: AC

## 2024-09-23 MED ORDER — CHLORHEXIDINE GLUCONATE 0.12 % MT SOLN
OROMUCOSAL | Status: AC
  Administered 2024-09-23: 15 mL via OROMUCOSAL
  Filled 2024-09-23: qty 15

## 2024-09-23 MED ORDER — ALBUMIN HUMAN 25 % IV SOLN
25.0000 g | Freq: Once | INTRAVENOUS | Status: AC
  Administered 2024-09-24: 25 g via INTRAVENOUS

## 2024-09-23 NOTE — Discharge Instructions (Signed)
    Hand Surgery Postop Instructions   Dressings: Maintain postoperative dressing until orthopedic follow-up.  Keep operative site clean and dry until orthopedic follow-up.  Wound Care: Keep your hand elevated above the level of your heart.  Do not allow it to dangle by your side. Moving your fingers is advised to stimulate circulation but will depend on the site of your surgery.  If you have a splint applied, your doctor will advise you regarding movement.  Activity: Do not drive or operate machinery until clearance given from physician. No heavy lifting with operative extremity.  Diet:  Drink liquids today or eat a light diet.  You may resume a regular diet tomorrow.    General expectations: Take prescribed medication if given, transition to over-the-counter medication as quickly as possible. Fingers may become slightly swollen.  Call your doctor if any of the following occur: Severe pain not relieved by pain medication. Elevated temperature. Dressing soaked with blood. Inability to move fingers. White or bluish color to fingers.   Per [facility] clinic policy, our goal is ensure optimal postoperative pain control with a multimodal pain management strategy. For all [facility] patients, our goal is to wean post-operative narcotic medications by 6 weeks post-operatively. If this is not possible due to utilization of pain medication prior to surgery, your [facility] doctor will support your acute post-operative pain control for the first 6 weeks postoperatively, with a plan to transition you back to your primary pain team following that. Cyndia Skeeters will work to ensure a Therapist, occupational.  [name] [name], M.D. Hand Surgery [facility]

## 2024-09-23 NOTE — Transfer of Care (Signed)
 Immediate Anesthesia Transfer of Care Note  Patient: [name]  Procedure(s) Performed: AMPUTATION, FINGER (Left)  Patient Location: PACU  Anesthesia Type:General  Level of Consciousness: awake, alert , and oriented  Airway & Oxygen  Therapy: Patient Spontanous Breathing and Patient connected to nasal cannula oxygen   Post-op Assessment: Report given to RN and Post -op Vital signs reviewed and stable  Post vital signs: Reviewed and stable  Last Vitals:  Vitals Value Taken Time  BP 93/69 09/23/24 13:30  Temp 36 1330  Pulse 88 09/23/24 13:35  Resp 17 09/23/24 13:35  SpO2 99 % 09/23/24 13:35  Vitals shown include unfiled device data.  Last Pain:  Vitals:   09/23/24 1136  TempSrc: Oral  PainSc:       Patients Stated Pain Goal: 0 (09/23/24 0744)  Complications: No notable events documented.

## 2024-09-23 NOTE — Progress Notes (Signed)
 " PROGRESS NOTE  [name]  FMW:[id number] DOB: 11/09/1955 DOA: 09/18/2024 PCP: Montey Lot, PA-C   Brief Narrative: Patient is a 69 year old male with history of bilateral BKA, chronic A-fib on Eliquis , ESRD on dialysis on TTS schedule, insulin -dependent diabetes type 2, hypertension, moderate aortic stenosis, chronic pain syndrome, chronic right-sided systolic heart failure who presented with complaint of confusion, fever.  On presentation he was febrile, maintaining saturation on room air.  Lab work showed sodium of 128, potassium 6.3, lactate of 1.4, WBC count of 11.8.  COVID/flu/RSV negative.  EKG showed atrial fibrillation.  Chest x-ray showed left lower lobe airspace opacities concerning for pneumonia, new small-moderate left pleural effusion.  Nephrology consulted for dialysis.  Currently being managed for  pneumonia/ Proteus bacteremia.  Sepsis physiology has resolved.  Currently afebrile, on room air, alert and oriented.  Hand surgery planning for amputation of gangrenous left ring finger today  Assessment & Plan:  Principal Problem:   Acute metabolic encephalopathy Active Problems:   Atrial fibrillation, chronic (HCC)   History of anemia due to chronic kidney disease   End-stage renal disease on hemodialysis (HCC)   Severe sepsis (HCC)   DM2 (diabetes mellitus, type 2) (HCC)   HLD (hyperlipidemia)   Acute hyponatremia   Tobacco abuse   Hyperkalemia   Moderate aortic stenosis   HCAP (healthcare-associated pneumonia)   Fever   Dry gangrene (HCC)   Acute metabolic encephalopathy: Usually alert and oriented.  Presented with confusion, fever.  Takes fentanyl  patch, oxycodone , gabapentin .  Polypharmacy could also be possible.  MRI done on 12/18 showed chronic encephalomalacia changes in the right parietal lobe and to a lesser extent the right frontal lobe.Moderately advanced cerebral white matter disease bilaterally. TSH, vitamin B12 optimal.  Mildly elevated ammonia level of 41,  we do not think this is contributing to his confusion. Confusion likely from metabolic encephalopathy from sepsis secondary to pneumonia/bacteremia. He is mostly oriented now.  Sepsis secondary to pneumonia/HCAP/Proteus bacteremia: Presented with fever.  Chest x-ray showed left lower lobe opacity concerning for pneumonia.  Initial lactate was normal.  Blood culture showed Proteus.On ampicillin .  ESRD on dialysis/hyperkalemia: Nephrology following for dialysis.  Has AV fistula on the left upper extremity  Acute on chronic hypoosmolar hyponatremia: Volume management per dialysis.  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  The finger is black.  Plan for amputation on Wednesday  Chronic A-fib: Monitor on telemetry.  On Eliquis  which is currently on hold for upcoming surgery.  Takes Coreg  for rate control.  Started on IV heparin   Type 2 diabetes: Continue current insulin  regimen.  Monitor blood sugars.  Recent A1c of 6.9.  Hyperlipidemia: On Lipitor   History of moderate aortic stenosis: Patient echo showed EF of 50 to 55%, moderate aortic stenosis.  We recommend to follow-up with cardiology as an outpatient  Anemia chronic disease: Currently hemoglobin stable  Chronic tobacco use: Continues to smoke.  Counseled cessation.  Continue current patch  Debility/deconditioning: Patient residing at [facility].  Patient has bilateral BKA     Wound 08/25/24 2047 Pressure Injury Sacrum Stage 2 -  Partial thickness loss of dermis presenting as a shallow open injury with a red, pink wound bed without slough. (Active)     Wound 09/19/24 1735 Pressure Injury Thigh Left;Posterior Stage 1 -  Intact skin with non-blanchable redness of a localized area usually over a bony prominence. (Active)    DVT prophylaxis:     Code Status: Full  Code  Family Communication: Wife on phone on 1/12  Patient status: Inpatient  Patient is from : SNF  Anticipated discharge to:  SNF  Estimated DC date: Thursday or Friday   Consultants: Nephrology  Procedures: Dialysis  Antimicrobials:  Anti-infectives (From admission, onward)    Start     Dose/Rate Route Frequency Ordered Stop   09/23/24 1000  vancomycin  (VANCOREADY) IVPB 1500 mg/300 mL        1,500 mg 150 mL/hr over 120 Minutes Intravenous To Short Stay 09/23/24 0106 09/24/24 1000   09/22/24 2200  [MAR Hold]  ampicillin  (OMNIPEN) 2 g in sodium chloride  0.9 % 100 mL IVPB        (MAR Hold since Wed 09/23/2024 at 1123.Hold Reason: Transfer to a Procedural area)   2 g 300 mL/hr over 20 Minutes Intravenous Every 12 hours 09/22/24 0953 09/28/24 2159   09/19/24 2200  ceFEPIme  (MAXIPIME ) 1 g in sodium chloride  0.9 % 100 mL IVPB  Status:  Discontinued        1 g 200 mL/hr over 30 Minutes Intravenous Every 24 hours 09/18/24 2230 09/19/24 1005   09/19/24 2000  cefTRIAXone  (ROCEPHIN ) 2 g in sodium chloride  0.9 % 100 mL IVPB  Status:  Discontinued        2 g 200 mL/hr over 30 Minutes Intravenous Every 24 hours 09/19/24 1005 09/22/24 0953   09/19/24 1200  azithromycin  (ZITHROMAX ) tablet 500 mg        500 mg Oral Daily 09/19/24 1005 09/21/24 1217   09/18/24 1945  vancomycin  (VANCOREADY) IVPB 2000 mg/400 mL        2,000 mg 200 mL/hr over 120 Minutes Intravenous  Once 09/18/24 1938 09/18/24 2334   09/18/24 1930  ceFEPIme  (MAXIPIME ) 2 g in sodium chloride  0.9 % 100 mL IVPB        2 g 200 mL/hr over 30 Minutes Intravenous  Once 09/18/24 1928 09/18/24 2052   09/18/24 1930  metroNIDAZOLE  (FLAGYL ) IVPB 500 mg        500 mg 100 mL/hr over 60 Minutes Intravenous  Once 09/18/24 1928 09/18/24 2132   09/18/24 1930  vancomycin  (VANCOCIN ) IVPB 1000 mg/200 mL premix  Status:  Discontinued        1,000 mg 200 mL/hr over 60 Minutes Intravenous  Once 09/18/24 1928 09/18/24 1938       Subjective: Patient seen and examined at bedside today.  Hemodynamically stable.  Lying in bed.  Looks comfortable.  Denies new complaints today.   Plan for finger amputation today.  Remains in A-fib rhythm.  Objective: Vitals:   09/22/24 2146 09/23/24 0524 09/23/24 0744 09/23/24 1136  BP: 110/70 112/67 98/65 96/60   Pulse: 88 (!) 102 93   Resp: 17 18 18  (!) 28  Temp: 98.5 F (36.9 C) 99 F (37.2 C) 98.9 F (37.2 C) 98.6 F (37 C)  TempSrc: Oral Oral Oral Oral  SpO2: 98% 96% 95% (P) 95%  Weight:      Height:        Intake/Output Summary (Last 24 hours) at 09/23/2024 1316 Last data filed at 09/23/2024 0200 Gross per 24 hour  Intake 514.3 ml  Output 0 ml  Net 514.3 ml   Filed Weights   09/19/24 1701 09/20/24 0418 09/21/24 0500  Weight: 111.9 kg 108.7 kg 111.5 kg    Examination:  General exam: Overall comfortable, not in distress, deconditioned, chronically weak HEENT: PERRL Respiratory system:  no wheezes or crackles  Cardiovascular system: Irregularly irregular rhythm Gastrointestinal system:  Abdomen is nondistended, soft and nontender. Central nervous system: Alert and oriented Extremities: No edema, no clubbing ,no cyanosis, AV fistula on the left upper extremity, bilateral BKA, dry gangrene of the left ring finger Skin: No rashes, no ulcers,no icterus     Data Reviewed: I have personally reviewed following labs and imaging studies  CBC: Recent Labs  Lab 09/18/24 2019 09/19/24 0336 09/19/24 0345 09/21/24 0205 09/22/24 1114 09/23/24 0841  WBC 11.8* 8.6  --  6.1 7.5 8.3  NEUTROABS 9.6* 6.8  --   --   --   --   HGB 11.2* 10.0* 10.9* 9.6* 11.0* 9.7*  HCT 35.7* 30.9* 32.0* 30.1* 34.5* 29.9*  MCV 89.9 88.3  --  87.2 86.9 86.7  PLT 139* 118*  --  146* 175 154   Basic Metabolic Panel: Recent Labs  Lab 09/18/24 2019 09/19/24 0336 09/19/24 0345 09/19/24 2040 09/20/24 0247 09/21/24 0205 09/23/24 0841  NA 128* 132* 130*  --  135 132* 130*  K 6.3* 5.8* 5.6*  --  4.4 4.6 4.9  CL 88* 90*  --   --  92* 90* 90*  CO2 24 21*  --   --  25 24 22   GLUCOSE 148* 121*  --   --  108* 148* 148*  BUN 69* 71*  --    --  51* 68* 85*  CREATININE 7.47* 7.74*  --   --  6.39* 7.05* 9.24*  CALCIUM  8.7* 8.6*  --   --  8.7* 8.1* 8.6*  MG  --  2.2  --  2.0  --   --   --   PHOS  --  6.8*  --   --   --   --   --      Recent Results (from the past 240 hours)  Resp panel by RT-PCR (RSV, Flu A&B, Covid) Anterior Nasal Swab     Status: None   Collection Time: 09/18/24  7:54 PM   Specimen: Anterior Nasal Swab  Result Value Ref Range Status   SARS Coronavirus 2 by RT PCR NEGATIVE NEGATIVE Final   Influenza A by PCR NEGATIVE NEGATIVE Final   Influenza B by PCR NEGATIVE NEGATIVE Final    Comment: (NOTE) The Xpert Xpress SARS-CoV-2/FLU/RSV plus assay is intended as an aid in the diagnosis of influenza from Nasopharyngeal swab specimens and should not be used as a sole basis for treatment. Nasal washings and aspirates are unacceptable for Xpert Xpress SARS-CoV-2/FLU/RSV testing.  Fact Sheet for Patients: bloggercourse.com  Fact Sheet for Healthcare Providers: seriousbroker.it  This test is not yet approved or cleared by the [place]  FDA and has been authorized for detection and/or diagnosis of SARS-CoV-2 by FDA under an Emergency Use Authorization (EUA). This EUA will remain in effect (meaning this test can be used) for the duration of the COVID-19 declaration under Section 564(b)(1) of the Act, 21 U.S.C. section 360bbb-3(b)(1), unless the authorization is terminated or revoked.     Resp Syncytial Virus by PCR NEGATIVE NEGATIVE Final    Comment: (NOTE) Fact Sheet for Patients: bloggercourse.com  Fact Sheet for Healthcare Providers: seriousbroker.it  This test is not yet approved or cleared by the [place]  FDA and has been authorized for detection and/or diagnosis of SARS-CoV-2 by FDA under an Emergency Use Authorization (EUA). This EUA will remain in effect (meaning this test can be used) for  the duration of the COVID-19 declaration under Section 564(b)(1) of the Act, 21 U.S.C. section 360bbb-3(b)(1), unless the authorization  is terminated or revoked.  Performed at [facility], [address] [place], [place] [address]   Blood Culture (routine x 2)     Status: Abnormal   Collection Time: 09/18/24  8:19 PM   Specimen: BLOOD RIGHT ARM  Result Value Ref Range Status   Specimen Description BLOOD RIGHT ARM  Final   Special Requests   Final    BOTTLES DRAWN AEROBIC AND ANAEROBIC Blood Culture adequate volume   Culture  Setup Time   Final    GRAM NEGATIVE RODS AEROBIC BOTTLE ONLY CRITICAL RESULT CALLED TO, READ BACK BY AND VERIFIED WITH: PHARMD J LEDFORD 09/19/2024 @ 2351 BY AB Performed at [facility], [address] [place], [place] [address]    Culture PROTEUS MIRABILIS (A)  Final   Report Status 09/22/2024 FINAL  Final   Organism ID, Bacteria PROTEUS MIRABILIS  Final      Susceptibility   Proteus mirabilis - MIC*    AMPICILLIN  <=2 SENSITIVE Sensitive     CEFAZOLIN  (NON-URINE) 4 INTERMEDIATE Intermediate     CEFEPIME  <=0.12 SENSITIVE Sensitive     ERTAPENEM <=0.12 SENSITIVE Sensitive     CEFTRIAXONE  <=0.25 SENSITIVE Sensitive     CIPROFLOXACIN  >=4 RESISTANT Resistant     GENTAMICIN  8 INTERMEDIATE Intermediate     MEROPENEM  <=0.25 SENSITIVE Sensitive     TRIMETH /SULFA  >=320 RESISTANT Resistant     AMPICILLIN /SULBACTAM <=2 SENSITIVE Sensitive     PIP/TAZO Value in next row Sensitive      <=4 SENSITIVEThis is a modified FDA-approved test that has been validated and its performance characteristics determined by the reporting laboratory.  This laboratory is certified under the Clinical Laboratory Improvement Amendments CLIA as qualified to perform high complexity clinical laboratory testing.    * PROTEUS MIRABILIS  Blood Culture ID Panel (Reflexed)     Status: Abnormal   Collection Time: 09/18/24  8:19 PM  Result Value Ref Range Status   Enterococcus  faecalis NOT DETECTED NOT DETECTED Final   Enterococcus Faecium NOT DETECTED NOT DETECTED Final   Listeria monocytogenes NOT DETECTED NOT DETECTED Final   Staphylococcus species NOT DETECTED NOT DETECTED Final   Staphylococcus aureus (BCID) NOT DETECTED NOT DETECTED Final   Staphylococcus epidermidis NOT DETECTED NOT DETECTED Final   Staphylococcus lugdunensis NOT DETECTED NOT DETECTED Final   Streptococcus species NOT DETECTED NOT DETECTED Final   Streptococcus agalactiae NOT DETECTED NOT DETECTED Final   Streptococcus pneumoniae NOT DETECTED NOT DETECTED Final   Streptococcus pyogenes NOT DETECTED NOT DETECTED Final   A.calcoaceticus-baumannii NOT DETECTED NOT DETECTED Final   Bacteroides fragilis NOT DETECTED NOT DETECTED Final   Enterobacterales DETECTED (A) NOT DETECTED Final    Comment: Enterobacterales represent a large order of gram negative bacteria, not a single organism. CRITICAL RESULT CALLED TO, READ BACK BY AND VERIFIED WITH: PHARMD J LEDFORD 09/19/2024 @ 2351 BY AB    Enterobacter cloacae complex NOT DETECTED NOT DETECTED Final   Escherichia coli NOT DETECTED NOT DETECTED Final   Klebsiella aerogenes NOT DETECTED NOT DETECTED Final   Klebsiella oxytoca NOT DETECTED NOT DETECTED Final   Klebsiella pneumoniae NOT DETECTED NOT DETECTED Final   Proteus species DETECTED (A) NOT DETECTED Final    Comment: CRITICAL RESULT CALLED TO, READ BACK BY AND VERIFIED WITH: PHARMD J LEDFORD 09/19/2024 @ 2351 BY AB    Salmonella species NOT DETECTED NOT DETECTED Final   Serratia marcescens NOT DETECTED NOT DETECTED Final   Haemophilus influenzae NOT DETECTED NOT DETECTED  Final   Neisseria meningitidis NOT DETECTED NOT DETECTED Final   Pseudomonas aeruginosa NOT DETECTED NOT DETECTED Final   Stenotrophomonas maltophilia NOT DETECTED NOT DETECTED Final   Candida albicans NOT DETECTED NOT DETECTED Final   Candida auris NOT DETECTED NOT DETECTED Final   Candida glabrata NOT DETECTED NOT  DETECTED Final   Candida krusei NOT DETECTED NOT DETECTED Final   Candida parapsilosis NOT DETECTED NOT DETECTED Final   Candida tropicalis NOT DETECTED NOT DETECTED Final   Cryptococcus neoformans/gattii NOT DETECTED NOT DETECTED Final   CTX-M ESBL NOT DETECTED NOT DETECTED Final   Carbapenem resistance IMP NOT DETECTED NOT DETECTED Final   Carbapenem resistance KPC NOT DETECTED NOT DETECTED Final   Carbapenem resistance NDM NOT DETECTED NOT DETECTED Final   Carbapenem resist OXA 48 LIKE NOT DETECTED NOT DETECTED Final   Carbapenem resistance VIM NOT DETECTED NOT DETECTED Final    Comment: Performed at [facility], [address], [place], [place] [address]  Surgical pcr screen     Status: Abnormal   Collection Time: 09/22/24  9:40 PM   Specimen: Nasal Mucosa; Nasal Swab  Result Value Ref Range Status   MRSA, PCR POSITIVE (A) NEGATIVE Final   Staphylococcus aureus POSITIVE (A) NEGATIVE Final    Comment: RESULTS CALLLED TO, READ BACK BY AND VERIFIED WITH: RN C.BOKIAGON ON 09/23/24 AT 0018 BY NM (NOTE) The Xpert SA Assay (FDA approved for NASAL specimens in patients 19 years of age and older), is one component of a comprehensive surveillance program. It is not intended to diagnose infection nor to guide or monitor treatment. Performed at [facility], [address] [place], [place] [address]      Radiology Studies: No results found.   Scheduled Meds:  [MAR Hold] atorvastatin   80 mg Oral QHS   [MAR Hold] Chlorhexidine  Gluconate Cloth  6 each Topical Q0600   [MAR Hold] Chlorhexidine  Gluconate Cloth  6 each Topical Daily   [MAR Hold] collagenase    Topical Daily   [MAR Hold] ferrous sulfate   325 mg Oral Q breakfast   [MAR Hold] insulin  aspart  0-6 Units Subcutaneous TID WC   [MAR Hold] mupirocin  ointment  1 Application Nasal BID   [MAR Hold] sevelamer  carbonate  800 mg Oral TID WC   [MAR Hold] sodium zirconium cyclosilicate   10 g Oral Daily   Continuous  Infusions:  [MAR Hold] ampicillin  (OMNIPEN) IV 2 g (09/23/24 0908)   lactated ringers      vancomycin  1,500 mg (09/23/24 1137)     LOS: 5 days   Ivonne Mustache, MD [facility] P1/14/2026, 1:16 PM  "

## 2024-09-23 NOTE — Progress Notes (Signed)
 " [facility] Progress Note   Subjective:    Seen and examined patient at bedside. Yesterday's HD not done 2nd high patient census. He's on RA and denies SOB, CP, and N/V. Plan for hand surgery for gangrenous left ring finger today. Plan to schedule HD today around procedure.  Objective Vitals:   09/22/24 1641 09/22/24 2146 09/23/24 0524 09/23/24 0744  BP: 109/71 110/70 112/67 98/65  Pulse: 94 88 (!) 102 93  Resp: 19 17 18 18   Temp: 98.3 F (36.8 C) 98.5 F (36.9 C) 99 F (37.2 C) 98.9 F (37.2 C)  TempSrc:  Oral Oral Oral  SpO2: 98% 98% 96% 95%  Weight:      Height:       Physical Exam General: Awake, alert, NAD Heart: S1 and S2; No murmurs, gallops, or rubs Lungs: Clear anteriorly Abdomen: Soft and non-tender Extremities: B/l BKAs Dialysis Access: L AVF   Filed Weights   09/19/24 1701 09/20/24 0418 09/21/24 0500  Weight: 111.9 kg 108.7 kg 111.5 kg    Intake/Output Summary (Last 24 hours) at 09/23/2024 1022 Last data filed at 09/23/2024 0200 Gross per 24 hour  Intake 814.3 ml  Output 0 ml  Net 814.3 ml    Additional Objective Labs: Basic Metabolic Panel: Recent Labs  Lab 09/19/24 0336 09/19/24 0345 09/20/24 0247 09/21/24 0205 09/23/24 0841  NA 132*   < > 135 132* 130*  K 5.8*   < > 4.4 4.6 4.9  CL 90*  --  92* 90* 90*  CO2 21*  --  25 24 22   GLUCOSE 121*  --  108* 148* 148*  BUN 71*  --  51* 68* 85*  CREATININE 7.74*  --  6.39* 7.05* 9.24*  CALCIUM  8.6*  --  8.7* 8.1* 8.6*  PHOS 6.8*  --   --   --   --    < > = values in this interval not displayed.   Liver Function Tests: Recent Labs  Lab 09/18/24 2019 09/19/24 0336  AST 21 19  ALT 15 14  ALKPHOS 157* 135*  BILITOT 0.7 0.7  PROT 6.4* 5.9*  ALBUMIN  3.8 3.5   No results for input(s): LIPASE, AMYLASE in the last 168 hours. CBC: Recent Labs  Lab 09/18/24 2019 09/19/24 0336 09/19/24 0345 09/21/24 0205 09/22/24 1114 09/23/24 0841  WBC 11.8* 8.6  --  6.1 7.5 8.3   NEUTROABS 9.6* 6.8  --   --   --   --   HGB 11.2* 10.0*   < > 9.6* 11.0* 9.7*  HCT 35.7* 30.9*   < > 30.1* 34.5* 29.9*  MCV 89.9 88.3  --  87.2 86.9 86.7  PLT 139* 118*  --  146* 175 154   < > = values in this interval not displayed.   Blood Culture    Component Value Date/Time   SDES BLOOD RIGHT ARM 09/18/2024 2019   SPECREQUEST  09/18/2024 2019    BOTTLES DRAWN AEROBIC AND ANAEROBIC Blood Culture adequate volume   CULT PROTEUS MIRABILIS (A) 09/18/2024 2019   REPTSTATUS 09/22/2024 FINAL 09/18/2024 2019    Cardiac Enzymes: No results for input(s): CKTOTAL, CKMB, CKMBINDEX, TROPONINI in the last 168 hours. CBG: Recent Labs  Lab 09/22/24 0819 09/22/24 1123 09/22/24 1643 09/22/24 2150 09/23/24 0756  GLUCAP 146* 151* 168* 254* 202*   Iron Studies: No results for input(s): IRON, TIBC, TRANSFERRIN, FERRITIN in the last 72 hours. Lab Results  Component Value Date   INR 2.4 (H) 09/19/2024  INR 2.2 (H) 09/18/2024   INR 1.3 (H) 08/25/2024   Studies/Results: No results found.  Medications:  ampicillin  (OMNIPEN) IV 2 g (09/23/24 0908)   vancomycin       atorvastatin   80 mg Oral QHS   Chlorhexidine  Gluconate Cloth  6 each Topical Q0600   Chlorhexidine  Gluconate Cloth  6 each Topical Daily   collagenase    Topical Daily   ferrous sulfate   325 mg Oral Q breakfast   insulin  aspart  0-6 Units Subcutaneous TID WC   mupirocin  ointment  1 Application Nasal BID   sevelamer  carbonate  800 mg Oral TID WC   sodium zirconium cyclosilicate   10 g Oral Daily    Dialysis Orders: [place] T[facility] 4h   B450   106kg  2K bath  AVF   Hep none Last OP HD 1/06, post wt 106kg  Assessment/Plan: Acute encephalopathy - resolved.  Likely 2/2 PNA and bacteremia.    PNA - in LLL on CXR.  On IV ceftriaxone  and PO azithromycin    Bacteremia - blood cultures positive for proteus. On IV ceftriaxone  and PO azithromycin    Left ring finger dry gangrene- ortho following, partial  amputation planned for today   ESRD - on HD TTS. Off schedule 2nd high patient census on HD unit. Plan for HD today.   Anemia of CKD- Hgb 9.6. No indication for ESA yet   Secondary hyperparathyroidism - Ca in goal. Phos elevated. Continue binders.   HTN/volume - Blood pressure soft/stable for now, monitor closely. Does not appear volume overloaded.  UF as tolerated.    Nutrition - Renal diet w/fluid restrictions.  Charmaine Piety, NP [facility] 09/23/2024,10:22 AM  LOS: 5 days    "

## 2024-09-23 NOTE — Progress Notes (Signed)
" ° °  [name] - 69 y.o. male MRN [id number]  Date of birth: 13-Apr-1956    Subjective:  69 year old male with left ring finger dry gangrene, exam unchanged.  Indicated for left ring finger partial amputation.  Objective:   VITALS:   Vitals:   09/22/24 2146 09/23/24 0524 09/23/24 0744 09/23/24 1136  BP: 110/70 112/67 98/65 96/60   Pulse: 88 (!) 102 93   Resp: 17 18 18  (!) 28  Temp: 98.5 F (36.9 C) 99 F (37.2 C) 98.9 F (37.2 C) 98.6 F (37 C)  TempSrc: Oral Oral Oral Oral  SpO2: 98% 96% 95% (P) 95%  Weight:      Height:        Left hand with notable necrotic portion of the distal phalanx of the ring finger, moderate erythema over the middle phalanx region with poor vascularity   Dorsal skin breakdown over the long finger dorsally at the P2 level, no evidence of erythema or drainage      Lab Results  Component Value Date   WBC 8.3 09/23/2024   HGB 9.7 (L) 09/23/2024   HCT 29.9 (L) 09/23/2024   MCV 86.7 09/23/2024   PLT 154 09/23/2024     Assessment/Plan:  Given the necrotic nature of the digit distally, patient is indicated for left ring finger partial amputation.  I did explain that we would need to amputate back to the appropriate level for wound healing, this would likely be to the PIP level.   Will proceed with surgery as scheduled.   Harold Barnhill, MD [place] 09/23/2024,   "

## 2024-09-23 NOTE — Op Note (Signed)
 NAME: [name] MEDICAL RECORD NO: [id number] DATE OF BIRTH: 1956-09-05 FACILITY: [name] LOCATION: [facility] OR PHYSICIAN: GILDARDO ALDERTON, MD   OPERATIVE REPORT   DATE OF PROCEDURE: 09/23/2024    PREOPERATIVE DIAGNOSIS: Left ring finger dry gangrene   POSTOPERATIVE DIAGNOSIS: Left ring finger dry gangrene   PROCEDURE: Left ring finger partial amputation   SURGEON:  Gildardo Alderton, M.D.   ASSISTANT: Joesph Dinsmore, OPA   ANESTHESIA:  General   INTRAVENOUS FLUIDS:  Per anesthesia flow sheet.   ESTIMATED BLOOD LOSS:  Minimal.   COMPLICATIONS:  None.   SPECIMENS:  none   DISPOSITION:  Stable to PACU.   INDICATIONS: 69 year old male who was seen in the inpatient setting with notable left ring finger with dry gangrene and necrotic tip.  Patient was indicated for left ring finger partial amputation after extensive discussion.  Risks and benefits of surgery were discussed including the risks of infection, bleeding, scarring, stiffness, nerve injury, vascular injury, tendon injury, need for subsequent operation, persistent infection, recurrence.  He voiced understanding of these risks and elected to proceed.  OPERATIVE COURSE: Patient was seen and identified in the preoperative area and marked appropriately.  Surgical consent had been signed. Preoperative IV antibiotic prophylaxis was given. He was transferred to the operating room and placed in supine position with the Left upper extremity on an arm board.  General anesthesia was induced by the anesthesiologist.  Left upper extremity was prepped and draped in normal sterile orthopedic fashion.  A surgical pause was performed between the surgeons, anesthesia, and operating room staff and all were in agreement as to the patient, procedure, and site of procedure.  Tourniquet was not utilized for this case as the left upper extremity had the fistula site.  Finger turnicot was utilized for the ring finger.  [place] incision was designed at  the P2 level, just distal to the PIP joint.  Incision was carried down utilizing a 15 blade, flexor and extensor apparatus were cut sharply to expose the underlying bone.  Digital neurovascular bundles were also cut appropriately both radially and ulnarly.  Exposed bone of the middle phalanx was visualized, rongeur was utilized, bone was noted to be quite soft.  Middle phalanx was easily removed utilizing a rondure given the poor nature of the bone, decision was made to remove entirety of the metal phalanx given the poor bone quality to the level of the PIP joint.  PIP joint was exposed appropriately at the articular surface.  Flexor and extensor tendon were then sutured together over the MP joint for appropriate soft tissue padding, using 3-0 Vicryl in figure-of-eight fashion.  Neurovascular bundles were once again identified, electrocautery was utilized, nerve stumps were then buried in soft tissue to help prevent neuroma formation.   Finger turnicot was removed.  Bipolar electrocautery was utilized for hemostasis.  Copious irrigation was performed.  Once we had appropriate soft tissue coverage over the PIP joint with appropriate neurectomies, skin closure performed utilizing 4-0 nylon in simple standard fashion.  Sterile dressings were then applied utilizing Xeroform, 4 x 4's, gauze and Ace wrap.  The operative drapes were broken down. The patient was awoken from anesthesia safely and taken to PACU in stable condition.    Post-operative plan: The patient will recover in the post-anesthesia care unit and then be readmitted under the medical service.  The patient will be non weight bearing on the left upper extremity in a dressing.   I will see the patient back in the office  in 7 to 10 days for postoperative followup.  He can resume his standard medications and blood thinners postoperatively.  Clemmie Buelna, MD Electronically signed, 09/23/2024

## 2024-09-23 NOTE — Anesthesia Preprocedure Evaluation (Addendum)
 "                                  Anesthesia Evaluation  Patient identified by MRN, date of birth, ID band Patient awake    Reviewed: Allergy & Precautions, H&P , NPO status , Patient's Chart, lab work & pertinent test results, reviewed documented beta blocker date and time   Airway Mallampati: III  TM Distance: >3 FB Neck ROM: Full    Dental  (+) Edentulous Upper, Edentulous Lower   Pulmonary sleep apnea (pt denies) , COPD, Current Smoker Current smoker, 35 pack year history    Pulmonary exam normal breath sounds clear to auscultation       Cardiovascular hypertension, Pt. on medications and Pt. on home beta blockers + Peripheral Vascular Disease and +CHF (moderate RV failure)  Normal cardiovascular exam+ dysrhythmias (eliquis ) + Valvular Problems/Murmurs (mod-severe AS, mild MR) AS and MR  Rhythm:Regular Rate:Normal  Echo 08/2024 1. Left ventricular ejection fraction, by estimation, is 50 to 55%. The  left ventricle has low normal function. The left ventricular internal  cavity size was mildly dilated. Left ventricular diastolic parameters are  indeterminate.   2. Right ventricular systolic function is moderately reduced. The right  ventricular size is moderately enlarged. There is mildly elevated  pulmonary artery systolic pressure. The estimated right ventricular  systolic pressure is 37.3 mmHg.   3. Left atrial size was moderately dilated.   4. Right atrial size was moderately dilated.   5. The mitral valve is degenerative. Mild mitral valve regurgitation.   6. Dimensionless index 0.26. The aortic valve is tricuspid. There is  severe calcifcation of the aortic valve. Moderate to severe aortic valve  stenosis. Aortic valve area, by VTI measures 1.22 cm. Aortic valve mean  gradient measures 23.8 mmHg. Aortic  valve Vmax measures 3.16 m/s.   7. Aortic dilatation noted. There is borderline dilatation of the  ascending aorta, measuring 38 mm.   8. The inferior  vena cava is dilated in size with <50% respiratory  variability, suggesting right atrial pressure of 15 mmHg.     Neuro/Psych  Neuromuscular disease (peripheral neuropathy)  negative psych ROS   GI/Hepatic Neg liver ROS, hiatal hernia,GERD  ,,  Endo/Other  diabetes, Well Controlled, Type 2, Insulin  Dependent  BMI 32  Renal/GU ESRF and DialysisRenal diseaseK 4.9 this AM  negative genitourinary   Musculoskeletal  (+) Arthritis , Osteoarthritis,  Gangrenous L ring finger   Abdominal  (+) + obese  Peds negative pediatric ROS (+)  Hematology  (+) Blood dyscrasia, anemia Hb 9.7, plt 154   Anesthesia Other Findings  presented with complaint of confusion, fever.  On presentation he was febrile, maintaining saturation on room air.  Lab work showed sodium of 128, potassium 6.3, lactate of 1.4, WBC count of 11.8.  COVID/flu/RSV negative.  EKG showed atrial fibrillation.  Chest x-ray showed left lower lobe airspace opacities concerning for pneumonia, new small-moderate left pleural effusion.  Nephrology consulted for dialysis.  Currently being managed for  pneumonia/ Proteus bacteremia.  Sepsis physiology has resolved  Reproductive/Obstetrics negative OB ROS                              Anesthesia Physical Anesthesia Plan  ASA: 4  Anesthesia Plan: MAC   Post-op Pain Management:    Induction:   PONV Risk Score and Plan: 2  and Propofol  infusion and TIVA  Airway Management Planned: Natural Airway and Simple Face Mask  Additional Equipment: None  Intra-op Plan:   Post-operative Plan:   Informed Consent: I have reviewed the patients History and Physical, chart, labs and discussed the procedure including the risks, benefits and alternatives for the proposed anesthesia with the patient or authorized representative who has indicated his/her understanding and acceptance.       Plan Discussed with: CRNA  Anesthesia Plan Comments:           Anesthesia Quick Evaluation  "

## 2024-09-23 NOTE — Anesthesia Procedure Notes (Signed)
 Procedure Name: LMA Insertion Date/Time: 09/23/2024 12:30 PM  Performed by: Aeon Kessner, CRNAPre-anesthesia Checklist: Patient identified, Emergency Drugs available, Suction available and Patient being monitored Patient Re-evaluated:Patient Re-evaluated prior to induction Oxygen  Delivery Method: Circle System Utilized Preoxygenation: Pre-oxygenation with 100% oxygen  Induction Type: IV induction Ventilation: Mask ventilation without difficulty LMA: LMA inserted LMA Size: 5.0 Number of attempts: 1 Airway Equipment and Method: Bite block Placement Confirmation: positive ETCO2 Tube secured with: Tape Dental Injury: Teeth and Oropharynx as per pre-operative assessment

## 2024-09-24 ENCOUNTER — Encounter (HOSPITAL_COMMUNITY): Payer: Self-pay | Admitting: Orthopedic Surgery

## 2024-09-24 DIAGNOSIS — G9341 Metabolic encephalopathy: Secondary | ICD-10-CM | POA: Diagnosis not present

## 2024-09-24 LAB — CBC
HCT: 31 % — ABNORMAL LOW (ref 39.0–52.0)
Hemoglobin: 9.8 g/dL — ABNORMAL LOW (ref 13.0–17.0)
MCH: 27.9 pg (ref 26.0–34.0)
MCHC: 31.6 g/dL (ref 30.0–36.0)
MCV: 88.3 fL (ref 80.0–100.0)
Platelets: 167 K/uL (ref 150–400)
RBC: 3.51 MIL/uL — ABNORMAL LOW (ref 4.22–5.81)
RDW: 18.6 % — ABNORMAL HIGH (ref 11.5–15.5)
WBC: 9.9 K/uL (ref 4.0–10.5)
nRBC: 0 % (ref 0.0–0.2)

## 2024-09-24 LAB — BASIC METABOLIC PANEL WITH GFR
Anion gap: 18 — ABNORMAL HIGH (ref 5–15)
BUN: 46 mg/dL — ABNORMAL HIGH (ref 8–23)
CO2: 23 mmol/L (ref 22–32)
Calcium: 8.9 mg/dL (ref 8.9–10.3)
Chloride: 89 mmol/L — ABNORMAL LOW (ref 98–111)
Creatinine, Ser: 5.96 mg/dL — ABNORMAL HIGH (ref 0.61–1.24)
GFR, Estimated: 10 mL/min — ABNORMAL LOW
Glucose, Bld: 144 mg/dL — ABNORMAL HIGH (ref 70–99)
Potassium: 4.4 mmol/L (ref 3.5–5.1)
Sodium: 130 mmol/L — ABNORMAL LOW (ref 135–145)

## 2024-09-24 LAB — GLUCOSE, CAPILLARY
Glucose-Capillary: 136 mg/dL — ABNORMAL HIGH (ref 70–99)
Glucose-Capillary: 130 mg/dL — ABNORMAL HIGH (ref 70–99)
Glucose-Capillary: 146 mg/dL — ABNORMAL HIGH (ref 70–99)

## 2024-09-24 MED ORDER — AMOXICILLIN 500 MG PO CAPS
500.0000 mg | ORAL_CAPSULE | Freq: Two times a day (BID) | ORAL | Status: DC
  Administered 2024-09-24 – 2024-09-25 (×3): 500 mg via ORAL
  Filled 2024-09-24 (×3): qty 1

## 2024-09-24 MED ORDER — CARVEDILOL 12.5 MG PO TABS
12.5000 mg | ORAL_TABLET | Freq: Two times a day (BID) | ORAL | Status: DC
  Administered 2024-09-25: 12.5 mg via ORAL
  Filled 2024-09-24 (×2): qty 1

## 2024-09-24 MED ORDER — ALBUMIN HUMAN 25 % IV SOLN
25.0000 g | Freq: Once | INTRAVENOUS | Status: AC
  Administered 2024-09-24: 25 g via INTRAVENOUS

## 2024-09-24 MED ORDER — CARVEDILOL 6.25 MG PO TABS
6.2500 mg | ORAL_TABLET | Freq: Two times a day (BID) | ORAL | Status: DC
  Administered 2024-09-24: 6.25 mg via ORAL
  Filled 2024-09-24: qty 1

## 2024-09-24 NOTE — Anesthesia Postprocedure Evaluation (Signed)
"   Anesthesia Post Note  Patient: [name]  Procedure(s) Performed: AMPUTATION, FINGER (Left)     Patient location during evaluation: PACU Anesthesia Type: General Level of consciousness: awake and alert Pain management: pain level controlled Vital Signs Assessment: post-procedure vital signs reviewed and stable Respiratory status: spontaneous breathing, nonlabored ventilation, respiratory function stable and patient connected to nasal cannula oxygen  Cardiovascular status: blood pressure returned to baseline and stable Postop Assessment: no apparent nausea or vomiting Anesthetic complications: no   No notable events documented.  Last Vitals:  Vitals:   09/24/24 1234 09/24/24 1644  BP: 102/65 112/71  Pulse: 92 (!) 108  Resp: 18 19  Temp: 37.1 C 36.7 C  SpO2: 100% 100%    Last Pain:  Vitals:   09/24/24 1234  TempSrc: Oral  PainSc:                  Thom [name]      "

## 2024-09-24 NOTE — TOC Progression Note (Signed)
 Transition of Care [facility]) - Progression Note    Patient Details  Name: [name] MRN: [id number] Date of Birth: 1956-01-24  [facility]) CM/SW Contact  Lendia Dais, [place] Phone Number: 09/24/2024, 4:05 PM  Clinical Narrative:  Per MD pt is medically ready to return to SNF tomorrow. CSW informed [facility] who stated they can take the pt whenever.  CSW will continue to follow for discharge.     Expected Discharge Plan: Skilled Nursing Facility Barriers to Discharge: Continued Medical Work up               Expected Discharge Plan and Services In-house Referral: Clinical Social Work   Post Acute Care Choice: Skilled Nursing Facility Living arrangements for the past 2 months: Skilled Nursing Facility                                       Social Drivers of Health (SDOH) Interventions SDOH Screenings   Food Insecurity: No Food Insecurity (03/25/2024)  Housing: Low Risk (03/25/2024)  Transportation Needs: No Transportation Needs (03/25/2024)  Utilities: Not At Risk (03/25/2024)  Social Connections: Moderately Isolated (03/25/2024)  Tobacco Use: High Risk (09/23/2024)    Readmission Risk Interventions    08/12/2024    1:17 PM 05/29/2023    4:41 PM  Readmission Risk Prevention Plan  Transportation Screening Complete Complete  Medication Review Oceanographer) Complete Complete  PCP or Specialist appointment within 3-5 days of discharge  Complete  HRI or Home Care Consult Complete Complete  SW Recovery Care/Counseling Consult  Complete  Palliative Care Screening Not Applicable Not Applicable  Skilled Nursing Facility Not Applicable Complete

## 2024-09-24 NOTE — Progress Notes (Signed)
 MD on call made aware of HR sustaining at high 110's. Patient is alert to self only. Denies any pain or discomfort.

## 2024-09-24 NOTE — Progress Notes (Signed)
 " PROGRESS NOTE  [name]  FMW:[id number] DOB: [birth year] DOA: 09/18/2024 PCP: Montey Lot, PA-C   Brief Narrative: Patient is a 69 year old male with history of bilateral BKA, chronic A-fib on Eliquis , ESRD on dialysis on TTS schedule, insulin -dependent diabetes type 2, hypertension, moderate aortic stenosis, chronic pain syndrome, chronic right-sided systolic heart failure who presented with complaint of confusion, fever.  On presentation he was febrile, maintaining saturation on room air.  Lab work showed sodium of 128, potassium 6.3, lactate of 1.4, WBC count of 11.8.  COVID/flu/RSV negative.  EKG showed atrial fibrillation.  Chest x-ray showed left lower lobe airspace opacities concerning for pneumonia, new small-moderate left pleural effusion.  Nephrology consulted for dialysis.  Currently being managed for  pneumonia/ Proteus bacteremia.  Sepsis physiology has resolved.  Currently afebrile, on room air, alert and oriented.  Hand surgery did amputation of gangrenous left ring finger on 1/15.  Remains in A-fib rhythm with heart rate in the range of 100-120 today.  Plan for discharge tomorrow to SNF if remains hemodynamically stable.  TOC consulted  Assessment & Plan:  Principal Problem:   Acute metabolic encephalopathy Active Problems:   Atrial fibrillation, chronic (HCC)   History of anemia due to chronic kidney disease   End-stage renal disease on hemodialysis (HCC)   Severe sepsis (HCC)   DM2 (diabetes mellitus, type 2) (HCC)   HLD (hyperlipidemia)   Acute hyponatremia   Tobacco abuse   Hyperkalemia   Moderate aortic stenosis   HCAP (healthcare-associated pneumonia)   Fever   Dry gangrene (HCC)   Acute metabolic encephalopathy: Usually alert and oriented.  Presented with confusion, fever.  Takes fentanyl  patch, oxycodone , gabapentin .  Polypharmacy could also be possible.  MRI done on 12/18 showed chronic encephalomalacia changes in the right parietal lobe and to a lesser  extent the right frontal lobe.Moderately advanced cerebral white matter disease bilaterally. TSH, vitamin B12 optimal.  Mildly elevated ammonia level of 41, we do not think this is contributing to his confusion. Confusion likely from metabolic encephalopathy from sepsis secondary to pneumonia/bacteremia. He is mostly oriented now.  Sepsis secondary to pneumonia/HCAP/Proteus bacteremia: Presented with fever.  Chest x-ray showed left lower lobe opacity concerning for pneumonia.  Initial lactate was normal.  Blood culture showed Proteus.Abx changed to oral  ESRD on dialysis/hyperkalemia: Nephrology following for dialysis.  Has AV fistula on the left upper extremity  Acute on chronic hypoosmolar hyponatremia: Volume management per dialysis.  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  The finger was black.  S/P  amputation .  Orthopedics recommended to continue dressing until the outpatient follow-up in 7-10 days  Chronic A-fib: Monitor on telemetry.  On Eliquis  .Takes Coreg  for rate control.  Remains in A-fib with mild AVR today.  Increase the dose of Coreg  to 12.5 mg twice daily.  Type 2 diabetes: Continue current insulin  regimen.  Monitor blood sugars.  Recent A1c of 6.9.  Hyperlipidemia: On Lipitor   History of moderate aortic stenosis: Patient echo showed EF of 50 to 55%, moderate aortic stenosis.  We recommend to follow-up with cardiology as an outpatient  Anemia chronic disease: Currently hemoglobin stable  Chronic tobacco use: Continues to smoke.  Counseled cessation.  Continue current patch  Debility/deconditioning: Patient residing at [facility]  Patient has bilateral BKA     Wound 08/25/24 2047 Pressure Injury Sacrum Stage 2 -  Partial thickness loss of dermis presenting as a shallow open injury with a red, pink  wound bed without slough. (Active)     Wound 09/19/24 1735 Pressure Injury Thigh Left;Posterior Stage 1 -  Intact skin with  non-blanchable redness of a localized area usually over a bony prominence. (Active)    DVT prophylaxis: apixaban  (ELIQUIS ) tablet 5 mg     Code Status: Full Code  Family Communication: Wife on phone on 1/12.Called again today,call not received  Patient status: Inpatient  Patient is from : SNF  Anticipated discharge to: SNF  Estimated DC date: tomorow   Consultants: Nephrology  Procedures: Dialysis  Antimicrobials:  Anti-infectives (From admission, onward)    Start     Dose/Rate Route Frequency Ordered Stop   09/24/24 1000  amoxicillin  (AMOXIL ) capsule 500 mg        500 mg Oral Every 12 hours 09/24/24 0810 09/29/24 0959   09/23/24 1000  vancomycin  (VANCOREADY) IVPB 1500 mg/300 mL        1,500 mg 150 mL/hr over 120 Minutes Intravenous To Short Stay 09/23/24 0106 09/23/24 1337   09/22/24 2200  ampicillin  (OMNIPEN) 2 g in sodium chloride  0.9 % 100 mL IVPB  Status:  Discontinued        2 g 300 mL/hr over 20 Minutes Intravenous Every 12 hours 09/22/24 0953 09/24/24 0810   09/19/24 2200  ceFEPIme  (MAXIPIME ) 1 g in sodium chloride  0.9 % 100 mL IVPB  Status:  Discontinued        1 g 200 mL/hr over 30 Minutes Intravenous Every 24 hours 09/18/24 2230 09/19/24 1005   09/19/24 2000  cefTRIAXone  (ROCEPHIN ) 2 g in sodium chloride  0.9 % 100 mL IVPB  Status:  Discontinued        2 g 200 mL/hr over 30 Minutes Intravenous Every 24 hours 09/19/24 1005 09/22/24 0953   09/19/24 1200  azithromycin  (ZITHROMAX ) tablet 500 mg        500 mg Oral Daily 09/19/24 1005 09/21/24 1217   09/18/24 1945  vancomycin  (VANCOREADY) IVPB 2000 mg/400 mL        2,000 mg 200 mL/hr over 120 Minutes Intravenous  Once 09/18/24 1938 09/18/24 2334   09/18/24 1930  ceFEPIme  (MAXIPIME ) 2 g in sodium chloride  0.9 % 100 mL IVPB        2 g 200 mL/hr over 30 Minutes Intravenous  Once 09/18/24 1928 09/18/24 2052   09/18/24 1930  metroNIDAZOLE  (FLAGYL ) IVPB 500 mg        500 mg 100 mL/hr over 60 Minutes Intravenous  Once  09/18/24 1928 09/18/24 2132   09/18/24 1930  vancomycin  (VANCOCIN ) IVPB 1000 mg/200 mL premix  Status:  Discontinued        1,000 mg 200 mL/hr over 60 Minutes Intravenous  Once 09/18/24 1928 09/18/24 1938       Subjective: Patient seen and examined at bedside today.  Lying in bed.  Appears comfortable.  Remains in A-fib with heart rate in the range of 100-120.  Does not appear to be any current distress.  On room air.  Denies shortness of breath or cough.  Alert and oriented.  Objective: Vitals:   09/24/24 0500 09/24/24 0624 09/24/24 0626 09/24/24 0800  BP: 118/66 (!) 152/88 117/77 106/78  Pulse: (!) 107 (!) 107 (!) 114 (!) 132  Resp: 20 20  18   Temp: (!) 97.4 F (36.3 C)   98.6 F (37 C)  TempSrc: Oral   Axillary  SpO2: 100% 100%  97%  Weight:      Height:        Intake/Output  Summary (Last 24 hours) at 09/24/2024 1117 Last data filed at 09/24/2024 0600 Gross per 24 hour  Intake 590.13 ml  Output 0 ml  Net 590.13 ml   Filed Weights   09/21/24 0500 09/23/24 2356 09/24/24 0426  Weight: 111.5 kg 114 kg 112.4 kg    Examination:   General exam: Overall comfortable, not in distress, deconditioned, lying on bed HEENT: PERRL Respiratory system:  no wheezes or crackles  Cardiovascular system: Irregularly irregular rhythm Gastrointestinal system: Abdomen is nondistended, soft and nontender. Central nervous system: Alert and oriented Extremities: No edema, no clubbing ,no cyanosis, dressing on the left hand, AV fistula in the left upper extremity, bilateral BKA Skin: No rashes, no ulcers,no icterus     Data Reviewed: I have personally reviewed following labs and imaging studies  CBC: Recent Labs  Lab 09/18/24 2019 09/19/24 0336 09/19/24 0345 09/21/24 0205 09/22/24 1114 09/23/24 0841 09/24/24 0811  WBC 11.8* 8.6  --  6.1 7.5 8.3 9.9  NEUTROABS 9.6* 6.8  --   --   --   --   --   HGB 11.2* 10.0* 10.9* 9.6* 11.0* 9.7* 9.8*  HCT 35.7* 30.9* 32.0* 30.1* 34.5* 29.9*  31.0*  MCV 89.9 88.3  --  87.2 86.9 86.7 88.3  PLT 139* 118*  --  146* 175 154 167   Basic Metabolic Panel: Recent Labs  Lab 09/19/24 0336 09/19/24 0345 09/19/24 2040 09/20/24 0247 09/21/24 0205 09/23/24 0841 09/24/24 0811  NA 132* 130*  --  135 132* 130* 130*  K 5.8* 5.6*  --  4.4 4.6 4.9 4.4  CL 90*  --   --  92* 90* 90* 89*  CO2 21*  --   --  25 24 22 23   GLUCOSE 121*  --   --  108* 148* 148* 144*  BUN 71*  --   --  51* 68* 85* 46*  CREATININE 7.74*  --   --  6.39* 7.05* 9.24* 5.96*  CALCIUM  8.6*  --   --  8.7* 8.1* 8.6* 8.9  MG 2.2  --  2.0  --   --   --   --   PHOS 6.8*  --   --   --   --   --   --      Recent Results (from the past 240 hours)  Resp panel by RT-PCR (RSV, Flu A&B, Covid) Anterior Nasal Swab     Status: None   Collection Time: 09/18/24  7:54 PM   Specimen: Anterior Nasal Swab  Result Value Ref Range Status   SARS Coronavirus 2 by RT PCR NEGATIVE NEGATIVE Final   Influenza A by PCR NEGATIVE NEGATIVE Final   Influenza B by PCR NEGATIVE NEGATIVE Final    Comment: (NOTE) The Xpert Xpress SARS-CoV-2/FLU/RSV plus assay is intended as an aid in the diagnosis of influenza from Nasopharyngeal swab specimens and should not be used as a sole basis for treatment. Nasal washings and aspirates are unacceptable for Xpert Xpress SARS-CoV-2/FLU/RSV testing.  Fact Sheet for Patients: bloggercourse.com  Fact Sheet for Healthcare Providers: seriousbroker.it  This test is not yet approved or cleared by the [place]  FDA and has been authorized for detection and/or diagnosis of SARS-CoV-2 by FDA under an Emergency Use Authorization (EUA). This EUA will remain in effect (meaning this test can be used) for the duration of the COVID-19 declaration under Section 564(b)(1) of the Act, 21 U.S.C. section 360bbb-3(b)(1), unless the authorization is terminated or revoked.  Resp Syncytial Virus by PCR NEGATIVE  NEGATIVE Final    Comment: (NOTE) Fact Sheet for Patients: bloggercourse.com  Fact Sheet for Healthcare Providers: seriousbroker.it  This test is not yet approved or cleared by the [place]  FDA and has been authorized for detection and/or diagnosis of SARS-CoV-2 by FDA under an Emergency Use Authorization (EUA). This EUA will remain in effect (meaning this test can be used) for the duration of the COVID-19 declaration under Section 564(b)(1) of the Act, 21 U.S.C. section 360bbb-3(b)(1), unless the authorization is terminated or revoked.  Performed at [facility], [address], [place], [place] [address]   Blood Culture (routine x 2)     Status: Abnormal   Collection Time: 09/18/24  8:19 PM   Specimen: BLOOD RIGHT ARM  Result Value Ref Range Status   Specimen Description BLOOD RIGHT ARM  Final   Special Requests   Final    BOTTLES DRAWN AEROBIC AND ANAEROBIC Blood Culture adequate volume   Culture  Setup Time   Final    GRAM NEGATIVE RODS AEROBIC BOTTLE ONLY CRITICAL RESULT CALLED TO, READ BACK BY AND VERIFIED WITH: PHARMD J LEDFORD 09/19/2024 @ 2351 BY AB Performed at [facility], [address], [place], [place] [address]    Culture PROTEUS MIRABILIS (A)  Final   Report Status 09/22/2024 FINAL  Final   Organism ID, Bacteria PROTEUS MIRABILIS  Final      Susceptibility   Proteus mirabilis - MIC*    AMPICILLIN  <=2 SENSITIVE Sensitive     CEFAZOLIN  (NON-URINE) 4 INTERMEDIATE Intermediate     CEFEPIME  <=0.12 SENSITIVE Sensitive     ERTAPENEM <=0.12 SENSITIVE Sensitive     CEFTRIAXONE  <=0.25 SENSITIVE Sensitive     CIPROFLOXACIN  >=4 RESISTANT Resistant     GENTAMICIN  8 INTERMEDIATE Intermediate     MEROPENEM  <=0.25 SENSITIVE Sensitive     TRIMETH /SULFA  >=320 RESISTANT Resistant     AMPICILLIN /SULBACTAM <=2 SENSITIVE Sensitive     PIP/TAZO Value in next row Sensitive      <=4 SENSITIVEThis is a modified  FDA-approved test that has been validated and its performance characteristics determined by the reporting laboratory.  This laboratory is certified under the Clinical Laboratory Improvement Amendments CLIA as qualified to perform high complexity clinical laboratory testing.    * PROTEUS MIRABILIS  Blood Culture ID Panel (Reflexed)     Status: Abnormal   Collection Time: 09/18/24  8:19 PM  Result Value Ref Range Status   Enterococcus faecalis NOT DETECTED NOT DETECTED Final   Enterococcus Faecium NOT DETECTED NOT DETECTED Final   Listeria monocytogenes NOT DETECTED NOT DETECTED Final   Staphylococcus species NOT DETECTED NOT DETECTED Final   Staphylococcus aureus (BCID) NOT DETECTED NOT DETECTED Final   Staphylococcus epidermidis NOT DETECTED NOT DETECTED Final   Staphylococcus lugdunensis NOT DETECTED NOT DETECTED Final   Streptococcus species NOT DETECTED NOT DETECTED Final   Streptococcus agalactiae NOT DETECTED NOT DETECTED Final   Streptococcus pneumoniae NOT DETECTED NOT DETECTED Final   Streptococcus pyogenes NOT DETECTED NOT DETECTED Final   A.calcoaceticus-baumannii NOT DETECTED NOT DETECTED Final   Bacteroides fragilis NOT DETECTED NOT DETECTED Final   Enterobacterales DETECTED (A) NOT DETECTED Final    Comment: Enterobacterales represent a large order of gram negative bacteria, not a single organism. CRITICAL RESULT CALLED TO, READ BACK BY AND VERIFIED WITH: PHARMD J LEDFORD 09/19/2024 @ 2351 BY AB    Enterobacter cloacae complex NOT DETECTED NOT DETECTED Final   Escherichia coli  NOT DETECTED NOT DETECTED Final   Klebsiella aerogenes NOT DETECTED NOT DETECTED Final   Klebsiella oxytoca NOT DETECTED NOT DETECTED Final   Klebsiella pneumoniae NOT DETECTED NOT DETECTED Final   Proteus species DETECTED (A) NOT DETECTED Final    Comment: CRITICAL RESULT CALLED TO, READ BACK BY AND VERIFIED WITH: PHARMD J LEDFORD 09/19/2024 @ 2351 BY AB    Salmonella species NOT DETECTED NOT  DETECTED Final   Serratia marcescens NOT DETECTED NOT DETECTED Final   Haemophilus influenzae NOT DETECTED NOT DETECTED Final   Neisseria meningitidis NOT DETECTED NOT DETECTED Final   Pseudomonas aeruginosa NOT DETECTED NOT DETECTED Final   Stenotrophomonas maltophilia NOT DETECTED NOT DETECTED Final   Candida albicans NOT DETECTED NOT DETECTED Final   Candida auris NOT DETECTED NOT DETECTED Final   Candida glabrata NOT DETECTED NOT DETECTED Final   Candida krusei NOT DETECTED NOT DETECTED Final   Candida parapsilosis NOT DETECTED NOT DETECTED Final   Candida tropicalis NOT DETECTED NOT DETECTED Final   Cryptococcus neoformans/gattii NOT DETECTED NOT DETECTED Final   CTX-M ESBL NOT DETECTED NOT DETECTED Final   Carbapenem resistance IMP NOT DETECTED NOT DETECTED Final   Carbapenem resistance KPC NOT DETECTED NOT DETECTED Final   Carbapenem resistance NDM NOT DETECTED NOT DETECTED Final   Carbapenem resist OXA 48 LIKE NOT DETECTED NOT DETECTED Final   Carbapenem resistance VIM NOT DETECTED NOT DETECTED Final    Comment: Performed at [facility], [address], [place], [place] [address]  Surgical pcr screen     Status: Abnormal   Collection Time: 09/22/24  9:40 PM   Specimen: Nasal Mucosa; Nasal Swab  Result Value Ref Range Status   MRSA, PCR POSITIVE (A) NEGATIVE Final   Staphylococcus aureus POSITIVE (A) NEGATIVE Final    Comment: RESULTS CALLLED TO, READ BACK BY AND VERIFIED WITH: RN C.BOKIAGON ON 09/23/24 AT 0018 BY NM (NOTE) The Xpert SA Assay (FDA approved for NASAL specimens in patients 21 years of age and older), is one component of a comprehensive surveillance program. It is not intended to diagnose infection nor to guide or monitor treatment. Performed at [facility], [address], [place], [place] [address]      Radiology Studies: No results found.   Scheduled Meds:  amoxicillin   500 mg Oral Q12H   apixaban   5 mg Oral BID   atorvastatin   80 mg  Oral QHS   carvedilol   12.5 mg Oral BID WC   Chlorhexidine  Gluconate Cloth  6 each Topical Q0600   Chlorhexidine  Gluconate Cloth  6 each Topical Daily   collagenase    Topical Daily   ferrous sulfate   325 mg Oral Q breakfast   insulin  aspart  0-6 Units Subcutaneous TID WC   mupirocin  ointment  1 Application Nasal BID   sevelamer  carbonate  800 mg Oral TID WC   sodium zirconium cyclosilicate   10 g Oral Daily   Continuous Infusions:     LOS: 6 days   Ivonne Mustache, MD [facility] P1/15/2026, 11:17 AM  "

## 2024-09-24 NOTE — Progress Notes (Signed)
 " [facility] Progress Note   Subjective:    Seen and examined patient at bedside. Had HD last night, net uf 2.6L, no complaints  Objective Vitals:   09/24/24 0500 09/24/24 0624 09/24/24 0626 09/24/24 0800  BP: 118/66 (!) 152/88 117/77 106/78  Pulse: (!) 107 (!) 107 (!) 114 (!) 132  Resp: 20 20  18   Temp: (!) 97.4 F (36.3 C)   98.6 F (37 C)  TempSrc: Oral   Axillary  SpO2: 100% 100%  97%  Weight:      Height:       Physical Exam General: Awake, alert, NAD Heart: RRR Lungs: unlabored, normal wob Abdomen: Soft and non-tender Extremities: B/l BKAs, dressing over left hand Dialysis Access: L AVF   Filed Weights   09/21/24 0500 09/23/24 2356 09/24/24 0426  Weight: 111.5 kg 114 kg 112.4 kg    Intake/Output Summary (Last 24 hours) at 09/24/2024 1153 Last data filed at 09/24/2024 0600 Gross per 24 hour  Intake 590.13 ml  Output 0 ml  Net 590.13 ml    Additional Objective Labs: Basic Metabolic Panel: Recent Labs  Lab 09/19/24 0336 09/19/24 0345 09/21/24 0205 09/23/24 0841 09/24/24 0811  NA 132*   < > 132* 130* 130*  K 5.8*   < > 4.6 4.9 4.4  CL 90*   < > 90* 90* 89*  CO2 21*   < > 24 22 23   GLUCOSE 121*   < > 148* 148* 144*  BUN 71*   < > 68* 85* 46*  CREATININE 7.74*   < > 7.05* 9.24* 5.96*  CALCIUM  8.6*   < > 8.1* 8.6* 8.9  PHOS 6.8*  --   --   --   --    < > = values in this interval not displayed.   Liver Function Tests: Recent Labs  Lab 09/18/24 2019 09/19/24 0336  AST 21 19  ALT 15 14  ALKPHOS 157* 135*  BILITOT 0.7 0.7  PROT 6.4* 5.9*  ALBUMIN  3.8 3.5   No results for input(s): LIPASE, AMYLASE in the last 168 hours. CBC: Recent Labs  Lab 09/18/24 2019 09/19/24 0336 09/19/24 0345 09/21/24 0205 09/22/24 1114 09/23/24 0841 09/24/24 0811  WBC 11.8* 8.6  --  6.1 7.5 8.3 9.9  NEUTROABS 9.6* 6.8  --   --   --   --   --   HGB 11.2* 10.0*   < > 9.6* 11.0* 9.7* 9.8*  HCT 35.7* 30.9*   < > 30.1* 34.5* 29.9* 31.0*  MCV 89.9  88.3  --  87.2 86.9 86.7 88.3  PLT 139* 118*  --  146* 175 154 167   < > = values in this interval not displayed.   Blood Culture    Component Value Date/Time   SDES BLOOD RIGHT ARM 09/18/2024 2019   SPECREQUEST  09/18/2024 2019    BOTTLES DRAWN AEROBIC AND ANAEROBIC Blood Culture adequate volume   CULT PROTEUS MIRABILIS (A) 09/18/2024 2019   REPTSTATUS 09/22/2024 FINAL 09/18/2024 2019    Cardiac Enzymes: No results for input(s): CKTOTAL, CKMB, CKMBINDEX, TROPONINI in the last 168 hours. CBG: Recent Labs  Lab 09/23/24 1710 09/23/24 2101 09/23/24 2110 09/24/24 0844 09/24/24 1135  GLUCAP 127* 119* 136* 130* 146*   Iron Studies: No results for input(s): IRON, TIBC, TRANSFERRIN, FERRITIN in the last 72 hours. Lab Results  Component Value Date   INR 2.4 (H) 09/19/2024   INR 2.2 (H) 09/18/2024   INR 1.3 (H) 08/25/2024  Studies/Results: No results found.  Medications:    amoxicillin   500 mg Oral Q12H   apixaban   5 mg Oral BID   atorvastatin   80 mg Oral QHS   carvedilol   12.5 mg Oral BID WC   Chlorhexidine  Gluconate Cloth  6 each Topical Q0600   Chlorhexidine  Gluconate Cloth  6 each Topical Daily   collagenase    Topical Daily   ferrous sulfate   325 mg Oral Q breakfast   insulin  aspart  0-6 Units Subcutaneous TID WC   mupirocin  ointment  1 Application Nasal BID   sevelamer  carbonate  800 mg Oral TID WC   sodium zirconium cyclosilicate   10 g Oral Daily    Dialysis Orders: [place] TTS 4h   B450   106kg  2K bath  AVF   Hep none Last OP HD 1/06, post wt 106kg  Assessment/Plan: Acute encephalopathy - resolved.  Likely 2/2 PNA and bacteremia.    PNA - in LLL on CXR.  S/p abx   Bacteremia - blood cultures positive for proteus. On amoxicillin    Left ring finger dry gangrene- ortho following, partial amputation 1/14   ESRD - on HD TTS, HD today, back on sched   Anemia of CKD- Hgb 9.8. No indication for ESA yet, avoid IV Fe   Secondary  hyperparathyroidism - Ca in goal. Phos elevated. Continue binders.   HTN/volume - Blood pressure soft/stable for now, monitor closely. Does not appear volume overloaded.  UF as tolerated.    Nutrition - Renal diet w/fluid restrictions.  Ephriam Stank, MD [facility] 09/24/2024,11:53 AM  LOS: 6 days    "

## 2024-09-24 NOTE — Plan of Care (Signed)
  Problem: Education: Goal: Ability to describe self-care measures that may prevent or decrease complications (Diabetes Survival Skills Education) will improve Outcome: Completed/Met Goal: Individualized Educational Video(s) Outcome: Not Applicable   

## 2024-09-24 NOTE — Progress Notes (Signed)
 Pt alert to self, place, situation and is currently refusing dialysis. Notified MD-Patel. MD Tobie will reschedule treatment to another day.

## 2024-09-24 NOTE — Progress Notes (Signed)
 Patient is refusing hemodialysis treatment. He is alert to self, place and situation. CN aware, provider notified

## 2024-09-24 NOTE — Anesthesia Postprocedure Evaluation (Signed)
"   Anesthesia Post Note  Patient: [name]  Procedure(s) Performed: AMPUTATION, FINGER (Left)     Anesthesia Type: MAC Anesthetic complications: no   No notable events documented.  Last Vitals:  Vitals:   09/24/24 1234 09/24/24 1644  BP: 102/65 112/71  Pulse: 92 (!) 108  Resp: 18 19  Temp: 37.1 C 36.7 C  SpO2: 100% 100%    Last Pain:  Vitals:   09/24/24 1234  TempSrc: Oral  PainSc:                  Thom [name]      "

## 2024-09-24 NOTE — Progress Notes (Signed)
 Received patient in bed to unit.  Alert and orientedx2  Informed consent signed and in chart.   TX duration:3.5 hours  Patient hypotensive. Albumin  25gx2 doses given, goal lowered to 1500 Transported back to the room  Alert, without acute distress.  Hand-off given to patient's nurse.   Access used: AVF Access issues: none  Total UF removed: 2600 Medication(s) given: 3000 units heparin  bolus, albumin  25G x2 doses.  Post HD VS: see table below Post HD weight: 112.4kg   09/24/24 0426  Vitals  BP (!) 120/59  MAP (mmHg) 78  BP Location Right Arm  BP Method Automatic  Patient Position (if appropriate) Lying  Pulse Rate (!) 110  Pulse Rate Source Monitor  ECG Heart Rate (!) 101  Resp (!) 21  Weight 112.4 kg  Type of Weight Post-Dialysis  Oxygen  Therapy  SpO2 99 %  O2 Device Room Air  Patient Activity (if Appropriate) In bed  Pulse Oximetry Type Continuous  During Treatment Monitoring  HD Safety Checks Performed Yes  Intra-Hemodialysis Comments See progress note  Fistula / Graft Left Upper arm Arteriovenous fistula  Placement Date/Time: 07/21/20 0825   Placed prior to admission: No  Orientation: Left  Access Location: Upper arm  Access Type: Arteriovenous fistula  Site Condition No complications  Fistula / Graft Assessment Present;Thrill;Bruit  Status Deaccessed;Flushed;Patent      [name]

## 2024-09-25 DIAGNOSIS — G9341 Metabolic encephalopathy: Secondary | ICD-10-CM | POA: Diagnosis not present

## 2024-09-25 LAB — CBC
HCT: 32.6 % — ABNORMAL LOW (ref 39.0–52.0)
Hemoglobin: 10.5 g/dL — ABNORMAL LOW (ref 13.0–17.0)
MCH: 28.2 pg (ref 26.0–34.0)
MCHC: 32.2 g/dL (ref 30.0–36.0)
MCV: 87.4 fL (ref 80.0–100.0)
Platelets: 187 K/uL (ref 150–400)
RBC: 3.73 MIL/uL — ABNORMAL LOW (ref 4.22–5.81)
RDW: 18.7 % — ABNORMAL HIGH (ref 11.5–15.5)
WBC: 9.9 K/uL (ref 4.0–10.5)
nRBC: 0 % (ref 0.0–0.2)

## 2024-09-25 LAB — BASIC METABOLIC PANEL WITH GFR
Anion gap: 20 — ABNORMAL HIGH (ref 5–15)
BUN: 57 mg/dL — ABNORMAL HIGH (ref 8–23)
CO2: 22 mmol/L (ref 22–32)
Calcium: 9.1 mg/dL (ref 8.9–10.3)
Chloride: 90 mmol/L — ABNORMAL LOW (ref 98–111)
Creatinine, Ser: 6.94 mg/dL — ABNORMAL HIGH (ref 0.61–1.24)
GFR, Estimated: 8 mL/min — ABNORMAL LOW
Glucose, Bld: 188 mg/dL — ABNORMAL HIGH (ref 70–99)
Potassium: 4.5 mmol/L (ref 3.5–5.1)
Sodium: 132 mmol/L — ABNORMAL LOW (ref 135–145)

## 2024-09-25 LAB — GLUCOSE, CAPILLARY
Glucose-Capillary: 136 mg/dL — ABNORMAL HIGH (ref 70–99)
Glucose-Capillary: 158 mg/dL — ABNORMAL HIGH (ref 70–99)
Glucose-Capillary: 178 mg/dL — ABNORMAL HIGH (ref 70–99)

## 2024-09-25 MED ORDER — AMOXICILLIN 500 MG PO CAPS: 500.0000 mg | ORAL_CAPSULE | Freq: Two times a day (BID) | ORAL | Status: AC

## 2024-09-25 MED ORDER — CARVEDILOL 12.5 MG PO TABS: 12.5000 mg | ORAL_TABLET | Freq: Two times a day (BID) | ORAL | Status: AC

## 2024-09-25 NOTE — Progress Notes (Addendum)
 Given report to Vangie, staff nurse at [facility] and answered all her questions.

## 2024-09-25 NOTE — Progress Notes (Signed)
 D/C order noted. Contacted [facility] to be advised of pt's d/c today and that pt should resume care tomorrow.   Randine Mungo Dialysis Navigator [phone]

## 2024-09-25 NOTE — TOC Transition Note (Signed)
 Transition of Care [facility]) - Discharge Note   Patient Details  Name: [name] MRN: [id number] Date of Birth: 1955-12-28  Transition of Care [facility]) CM/SW Contact:  Lendia Dais, LCSWA Phone Number: 09/25/2024, 11:59 AM   Clinical Narrative:  Pt is discharging today and return to [facility]. RN report to [phone]. PTAR called at 11:55 and will arrive within 1:30-2:30.  CSW called and informed pt's spouse of discharge and transport.  No further TOC needs.    Final next level of care: Long Term Nursing Home Barriers to Discharge: Barriers Resolved   Patient Goals and CMS Choice Patient states their goals for this hospitalization and ongoing recovery are:: From ltc, returning when medically stable   Choice offered to / list presented to : NA      Discharge Placement              Patient chooses bed at: [facility] Patient to be transferred to facility by: PTAR Name of family member notified: [name]  Spouse, Emergency Contact  [phone] Patient and family notified of of transfer: 09/25/24  Discharge Plan and Services Additional resources added to the After Visit Summary for   In-house Referral: Clinical Social Work   Post Acute Care Choice: Skilled Nursing Facility                               Social Drivers of Health (SDOH) Interventions SDOH Screenings   Food Insecurity: No Food Insecurity (03/25/2024)  Housing: Low Risk (03/25/2024)  Transportation Needs: No Transportation Needs (03/25/2024)  Utilities: Not At Risk (03/25/2024)  Social Connections: Moderately Isolated (03/25/2024)  Tobacco Use: High Risk (09/23/2024)     Readmission Risk Interventions    08/12/2024    1:17 PM 05/29/2023    4:41 PM  Readmission Risk Prevention Plan  Transportation Screening Complete Complete  Medication Review Oceanographer) Complete Complete  PCP or Specialist appointment within 3-5 days of discharge  Complete  HRI or Home Care Consult  Complete Complete  SW Recovery Care/Counseling Consult  Complete  Palliative Care Screening Not Applicable Not Applicable  Skilled Nursing Facility Not Applicable Complete

## 2024-09-25 NOTE — Discharge Summary (Signed)
 Physician Discharge Summary  [name] FMW:[id number] DOB: [birth year] DOA: 09/18/2024  PCP: Montey Lot, PA-C  Admit date: 09/18/2024 Discharge date: 09/25/2024  Admitted From: SNF Disposition:  SNF  Discharge Condition:Stable CODE STATUS:FULL Diet recommendation:Renal diet  Brief/Interim Summary: Patient is a 69 year old male with history of bilateral BKA, chronic A-fib on Eliquis , ESRD on dialysis on TTS schedule, insulin -dependent diabetes type 2, hypertension, moderate aortic stenosis, chronic pain syndrome, chronic right-sided systolic heart failure who presented with complaint of confusion, fever.  On presentation he was febrile, maintaining saturation on room air.  Lab work showed sodium of 128, potassium 6.3, lactate of 1.4, WBC count of 11.8.  COVID/flu/RSV negative.  EKG showed atrial fibrillation.  Chest x-ray showed left lower lobe airspace opacities concerning for pneumonia, new small-moderate left pleural effusion.  Nephrology consulted for dialysis.  Treated  for  pneumonia/ Proteus bacteremia.  Sepsis physiology has resolved.  Currently afebrile, on room air.  Hand surgery did amputation of gangrenous left ring finger on 1/15.  Orthopedics cleared for discharge.  Currently he is afebrile, hemodynamically stable.  Medically stable for discharge to SNF today.  Following problems were addressed during the hospitalization:  Acute metabolic encephalopathy: Usually alert and oriented.  Presented with confusion, fever.  Takes fentanyl  patch, oxycodone , gabapentin .  Polypharmacy could also be possible.  MRI done on 12/18 showed chronic encephalomalacia changes in the right parietal lobe and to a lesser extent the right frontal lobe.Moderately advanced cerebral white matter disease bilaterally. TSH, vitamin B12 optimal.  Mildly elevated ammonia level of 41, we do not think this is contributing to his confusion. Confusion likely from metabolic encephalopathy from sepsis secondary to  pneumonia/bacteremia. Patient's orientation significantly improved but again became slightly confused after hand surgery.  As per the wife, he often develops confusion after anesthesia/surgical procedure.  Not agitated.  Hopefully his mentation will continue to improve   Sepsis secondary to pneumonia/HCAP/Proteus bacteremia: Presented with fever.  Chest x-ray showed left lower lobe opacity concerning for pneumonia.  Initial lactate was normal.  Blood culture showed Proteus.Abx changed to oral   ESRD on dialysis/hyperkalemia: Nephrology following for dialysis.  Has AV fistula on the left upper extremity   Acute on chronic hypoosmolar hyponatremia: Volume management per dialysis.  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  The finger was black.  S/P  amputation .  Orthopedics recommended to continue dressing until the outpatient follow-up in 7-10 days.  He has an appointment on 1/21   Chronic A-fib: Monitor on telemetry.  On Eliquis  .Takes Coreg  for rate control.  Remains in A-fib ,continue Coreg   12.5 mg twice daily.   Type 2 diabetes: Continue home regimen.  Recent A1c of 6.9.   Hyperlipidemia: On Lipitor    History of moderate aortic stenosis: Patient echo showed EF of 50 to 55%, moderate aortic stenosis.  We recommend to follow-up with cardiology as an outpatient   Anemia chronic disease: Currently hemoglobin stable   Chronic tobacco use: Continues to smoke.  Counseled cessation.    Debility/deconditioning: Patient residing at [facility].  Patient has bilateral BKA   Discharge Diagnoses:  Principal Problem:   Acute metabolic encephalopathy Active Problems:   Atrial fibrillation, chronic (HCC)   History of anemia due to chronic kidney disease   End-stage renal disease on hemodialysis (HCC)   Severe sepsis (HCC)   DM2 (diabetes mellitus, type 2) (HCC)   HLD (hyperlipidemia)   Acute hyponatremia   Tobacco abuse  Hyperkalemia   Moderate aortic  stenosis   HCAP (healthcare-associated pneumonia)   Fever   Dry gangrene [facility])    Discharge Instructions  Discharge Instructions     Discharge instructions   Complete by: As directed    1)Please take your medications as instructed 2)You have an appointment with orthopedics on 09/30/2024   Discharge wound care:   Complete by: As directed    As per wound care nurse for sacral /buttock wound only.  No dressing required for left hand.  Patient will follow-up with orthopedics for left hand   Increase activity slowly   Complete by: As directed       Allergies as of 09/25/2024       Reactions   Contrast Media [iodinated Contrast Media] Shortness Of Breath, Other (See Comments)   Altered mental status   Ivp Dye [iodinated Contrast Media] Anaphylaxis, Shortness Of Breath, Other (See Comments)   Altered mental status    Latex Rash   severe rash appears after the first 24 hours of being placed   Tape Rash, Other (See Comments)   Rash after 1 day of use Do not leave on longer then 3 days without changing        Medication List     TAKE these medications    rOPINIRole  2 MG tablet Commonly known as: REQUIP  Take 2 mg by mouth at bedtime. The timing of this medication is very important.   amoxicillin  500 MG capsule Commonly known as: AMOXIL  Take 1 capsule (500 mg total) by mouth every 12 (twelve) hours for 7 doses.   apixaban  5 MG Tabs tablet Commonly known as: ELIQUIS  Take 1 tablet (5 mg total) by mouth 2 (two) times daily.   atorvastatin  80 MG tablet Commonly known as: LIPITOR  Take 1 tablet (80 mg total) by mouth at bedtime.   barrier cream Crea Commonly known as: non-specified Apply 1 Application topically 2 (two) times daily as needed (incontinence). Apply a thin layer topically every day and night shift for protection   carvedilol  12.5 MG tablet Commonly known as: COREG  Take 1 tablet (12.5 mg total) by mouth 2 (two) times daily with a meal. What changed:   medication strength how much to take   colchicine  0.6 MG tablet Take 0.5 tablets (0.3 mg total) by mouth daily. What changed: when to take this   ethyl chloride spray Apply 1 Application topically daily.   famotidine  20 MG tablet Commonly known as: PEPCID  Take 20 mg by mouth See admin instructions. Give 1 tablet (20mg ) by mouth every other morning.   fentaNYL  12 MCG/HR Commonly known as: DURAGESIC  Place 1 patch onto the skin every 3 (three) days.   ferrous sulfate  325 (65 FE) MG tablet Take 325 mg by mouth in the morning.   folic acid  1 MG tablet Commonly known as: FOLVITE  Take 1 mg by mouth at bedtime.   folic acid -vitamin b complex-vitamin c -selenium-zinc  3 MG Tabs tablet Take 1 tablet by mouth at bedtime.   gabapentin  100 MG capsule Commonly known as: NEURONTIN  Take 100 mg by mouth 3 (three) times daily.   NovoLOG  FlexPen 100 UNIT/ML FlexPen Generic drug: insulin  aspart Inject 4-16 Units into the skin See admin instructions. Inject 4 units subcutaneously three times daily before meals, in ADDITION to 0-12 units per sliding scale: 70 - 150 : 0 units 151-200 : 2 units 201-250 : 4 units 251-300 : 6 units 301-350 : 8 units 351-400 : 10 units 401-450 : 12 units > 400 :  give 12 units and recheck in 1 hour - if still > 400, call MD/NP for further orders.   oxyCODONE  5 MG immediate release tablet Commonly known as: Oxy IR/ROXICODONE  Take 1 tablet (5 mg total) by mouth every 6 (six) hours as needed for moderate pain (pain score 4-6) or severe pain (pain score 7-10).   polyethylene glycol 17 g packet Commonly known as: MIRALAX  / GLYCOLAX  Take 17 g by mouth daily as needed for moderate constipation.   Preparation H 0.25 % Supp Generic drug: Phenylephrine  HCl Place 1 suppository rectally every 6 (six) hours as needed (hemorrhoids).   senna 8.6 MG Tabs tablet Commonly known as: SENOKOT Take 17.2 mg by mouth at bedtime.   sevelamer  carbonate 800 MG tablet Commonly  known as: RENVELA  Take 1 tablet (800 mg total) by mouth 3 (three) times daily with meals.   tamsulosin  0.4 MG Caps capsule Commonly known as: FLOMAX  Take 0.4 mg by mouth at bedtime.   Vashe Wound Soln Apply 1 Application topically daily. Apply to left middle finger topically evert day shift for cleansing, and pat dry               Discharge Care Instructions  (From admission, onward)           Start     Ordered   09/25/24 0000  Discharge wound care:       Comments: As per wound care nurse for sacral /buttock wound only.  No dressing required for left hand.  Patient will follow-up with orthopedics for left hand   09/25/24 1048            Contact information for follow-up providers     Arlinda Buster, MD. Go on 09/30/2024.   Specialty: Orthopedic Surgery Why: 10:15am Contact information: [address] [place]  [place] [address] [phone]              Contact information for after-discharge care     Destination     [facility] .   Service: Skilled Nursing Contact information: [address] [place]  [address] [phone]                    Allergies[1]  Consultations: Orthopedics, nephrology   Procedures/Studies: DG Chest Port 1 View Result Date: 09/18/2024 EXAM: 1 VIEW(S) XRAY OF THE CHEST 09/18/2024 07:43:00 PM COMPARISON: 08/25/2024 CLINICAL HISTORY: Questionable sepsis - evaluate for abnormality FINDINGS: LINES, TUBES AND DEVICES: Endotracheal and enteric tubes removed. LUNGS AND PLEURA: Stable pulmonary insufflation. New small-to-moderate left pleural effusion with associated left basilar atelectasis and resultant retrocardiac opacification. No pneumothorax. HEART AND MEDIASTINUM: Stable cardiomegaly. Aortic atherosclerotic calcification. BONES AND SOFT TISSUES: Cervical spine fusion hardware noted. No acute osseous abnormality. IMPRESSION: 1. New small-to-moderate left pleural effusion with associated left basilar  atelectasis and resultant retrocardiac opacification. 2. Stable cardiomegaly and aortic atherosclerotic calcification. Electronically signed by: Dorethia Molt MD MD 09/18/2024 07:55 PM EST RP Workstation: [id number]   DG Hand Complete Right Result Date: 08/31/2024 CLINICAL DATA:  Osteomyelitis. EXAM: RIGHT HAND - COMPLETE 3+ VIEW COMPARISON:  None Available. FINDINGS: No acute fracture or dislocation. The bones are osteopenic. There is arthritic changes of the interphalangeal joints. Diffuse vascular calcifications. Soft tissue swelling of the 2nd-4th digits. No opaque foreign object or subcu gas. IMPRESSION: 1. No acute fracture or dislocation. 2. Osteopenia with arthritic changes of the interphalangeal joints. 3. Soft tissue swelling of the 2nd-4th digits. Electronically Signed   By: Vanetta Chou M.D.   On:  08/31/2024 17:36   DG Hand Complete Left Result Date: 08/31/2024 CLINICAL DATA:  Open wound. EXAM: LEFT HAND - COMPLETE 3+ VIEW COMPARISON:  None Available. FINDINGS: There is no acute fracture or dislocation. The bones are osteopenic. Diffuse vascular calcification. Small radiopaque foreign object within soft tissues of the dorsum of the ulnar aspect of the wrist. No soft tissue gas. IMPRESSION: 1. No acute fracture or dislocation. 2. Small radiopaque foreign object within the soft tissues of the dorsum of the wrist. Electronically Signed   By: Vanetta Chou M.D.   On: 08/31/2024 13:46   MR BRAIN WO CONTRAST Result Date: 08/27/2024 EXAM: MRI BRAIN WITHOUT CONTRAST 08/27/2024 96:91:75 AM TECHNIQUE: Multiplanar multisequence MRI of the head/brain was performed without the administration of intravenous contrast. COMPARISON: MRA Head Without 04/14/2012 and CT of the head dated 08/25/2024. CLINICAL HISTORY: Delirium. FINDINGS: BRAIN AND VENTRICLES: No acute infarct. No intracranial hemorrhage. No mass. No midline shift. No hydrocephalus. Chronic encephalomalacia changes are again noted within the  right parietal lobe and to a lesser extent the right frontal lobe. There is moderately advanced cerebral white matter disease present bilaterally. There are curvilinear areas of hemosiderin staining present within the left parietal and occipital lobes. There are also a couple of foci of hemosiderin staining within the left frontal and right parietal lobes. The sella is unremarkable. Normal flow voids. ORBITS: No acute abnormality. SINUSES AND MASTOIDS: No acute abnormality. BONES AND SOFT TISSUES: Normal marrow signal. No acute soft tissue abnormality. IMPRESSION: 1. No acute intracranial abnormality. 2. Chronic encephalomalacia changes in the right parietal lobe and to a lesser extent the right frontal lobe. 3. Moderately advanced cerebral white matter disease bilaterally. 4. Curvilinear areas of hemosiderin staining in the left parietal and occipital lobes, as well as a couple of foci in the left frontal and right parietal lobes. Electronically signed by: Evalene Coho MD 08/27/2024 07:43 AM EST RP Workstation: [id number]      Subjective: Patient seen and examined at bedside today.  Lying in bed.  Appears comfortable.  Hemodynamically stable.  Heart rate well-controlled today.  Remains in A-fib rhythm.  No fever .  Calm and cooperative but slightly confused.  Medically stable for discharge to SNF today.  I called and discussed with her wife Katheryn about discharge planning  Discharge Exam: Vitals:   09/25/24 0431 09/25/24 0852  BP: 132/71 101/72  Pulse: 89 77  Resp: 20   Temp: 98.6 F (37 C) 98.5 F (36.9 C)  SpO2: 99% 97%   Vitals:   09/24/24 1644 09/24/24 2109 09/25/24 0431 09/25/24 0852  BP: 112/71 117/72 132/71 101/72  Pulse: (!) 108 89 89 77  Resp: 19 18 20    Temp: 98 F (36.7 C) 98.7 F (37.1 C) 98.6 F (37 C) 98.5 F (36.9 C)  TempSrc:   Oral Oral  SpO2: 100% 94% 99% 97%  Weight:      Height:        General: Pt is alert, awake, not in acute distress, chronically  deconditioned Cardiovascular: Irregularly irregular rhythm, no rubs, no gallops Respiratory: CTA bilaterally, no wheezing, no rhonchi Abdominal: Soft, NT, ND, bowel sounds + Extremities: no edema, no cyanosis, bilateral BKA, AV fistula on the right upper extremity, dressing on the left hand    The results of significant diagnostics from this hospitalization (including imaging, microbiology, ancillary and laboratory) are listed below for reference.     Microbiology: Recent Results (from the past 240 hours)  Resp panel by RT-PCR (RSV, Flu  A&B, Covid) Anterior Nasal Swab     Status: None   Collection Time: 09/18/24  7:54 PM   Specimen: Anterior Nasal Swab  Result Value Ref Range Status   SARS Coronavirus 2 by RT PCR NEGATIVE NEGATIVE Final   Influenza A by PCR NEGATIVE NEGATIVE Final   Influenza B by PCR NEGATIVE NEGATIVE Final    Comment: (NOTE) The Xpert Xpress SARS-CoV-2/FLU/RSV plus assay is intended as an aid in the diagnosis of influenza from Nasopharyngeal swab specimens and should not be used as a sole basis for treatment. Nasal washings and aspirates are unacceptable for Xpert Xpress SARS-CoV-2/FLU/RSV testing.  Fact Sheet for Patients: bloggercourse.com  Fact Sheet for Healthcare Providers: seriousbroker.it  This test is not yet approved or cleared by the [place]  FDA and has been authorized for detection and/or diagnosis of SARS-CoV-2 by FDA under an Emergency Use Authorization (EUA). This EUA will remain in effect (meaning this test can be used) for the duration of the COVID-19 declaration under Section 564(b)(1) of the Act, 21 U.S.C. section 360bbb-3(b)(1), unless the authorization is terminated or revoked.     Resp Syncytial Virus by PCR NEGATIVE NEGATIVE Final    Comment: (NOTE) Fact Sheet for Patients: bloggercourse.com  Fact Sheet for Healthcare  Providers: seriousbroker.it  This test is not yet approved or cleared by the [place]  FDA and has been authorized for detection and/or diagnosis of SARS-CoV-2 by FDA under an Emergency Use Authorization (EUA). This EUA will remain in effect (meaning this test can be used) for the duration of the COVID-19 declaration under Section 564(b)(1) of the Act, 21 U.S.C. section 360bbb-3(b)(1), unless the authorization is terminated or revoked.  Performed at [facility], [address], [place], [place] [address]   Blood Culture (routine x 2)     Status: Abnormal   Collection Time: 09/18/24  8:19 PM   Specimen: BLOOD RIGHT ARM  Result Value Ref Range Status   Specimen Description BLOOD RIGHT ARM  Final   Special Requests   Final    BOTTLES DRAWN AEROBIC AND ANAEROBIC Blood Culture adequate volume   Culture  Setup Time   Final    GRAM NEGATIVE RODS AEROBIC BOTTLE ONLY CRITICAL RESULT CALLED TO, READ BACK BY AND VERIFIED WITH: PHARMD J LEDFORD 09/19/2024 @ 2351 BY AB Performed at [facility], [address], [place], [place] [address]    Culture PROTEUS MIRABILIS (A)  Final   Report Status 09/22/2024 FINAL  Final   Organism ID, Bacteria PROTEUS MIRABILIS  Final      Susceptibility   Proteus mirabilis - MIC*    AMPICILLIN  <=2 SENSITIVE Sensitive     CEFAZOLIN  (NON-URINE) 4 INTERMEDIATE Intermediate     CEFEPIME  <=0.12 SENSITIVE Sensitive     ERTAPENEM <=0.12 SENSITIVE Sensitive     CEFTRIAXONE  <=0.25 SENSITIVE Sensitive     CIPROFLOXACIN  >=4 RESISTANT Resistant     GENTAMICIN  8 INTERMEDIATE Intermediate     MEROPENEM  <=0.25 SENSITIVE Sensitive     TRIMETH /SULFA  >=320 RESISTANT Resistant     AMPICILLIN /SULBACTAM <=2 SENSITIVE Sensitive     PIP/TAZO Value in next row Sensitive      <=4 SENSITIVEThis is a modified FDA-approved test that has been validated and its performance characteristics determined by the reporting laboratory.  This laboratory  is certified under the Clinical Laboratory Improvement Amendments CLIA as qualified to perform high complexity clinical laboratory testing.    * PROTEUS MIRABILIS  Blood Culture ID Panel (Reflexed)  Status: Abnormal   Collection Time: 09/18/24  8:19 PM  Result Value Ref Range Status   Enterococcus faecalis NOT DETECTED NOT DETECTED Final   Enterococcus Faecium NOT DETECTED NOT DETECTED Final   Listeria monocytogenes NOT DETECTED NOT DETECTED Final   Staphylococcus species NOT DETECTED NOT DETECTED Final   Staphylococcus aureus (BCID) NOT DETECTED NOT DETECTED Final   Staphylococcus epidermidis NOT DETECTED NOT DETECTED Final   Staphylococcus lugdunensis NOT DETECTED NOT DETECTED Final   Streptococcus species NOT DETECTED NOT DETECTED Final   Streptococcus agalactiae NOT DETECTED NOT DETECTED Final   Streptococcus pneumoniae NOT DETECTED NOT DETECTED Final   Streptococcus pyogenes NOT DETECTED NOT DETECTED Final   A.calcoaceticus-baumannii NOT DETECTED NOT DETECTED Final   Bacteroides fragilis NOT DETECTED NOT DETECTED Final   Enterobacterales DETECTED (A) NOT DETECTED Final    Comment: Enterobacterales represent a large order of gram negative bacteria, not a single organism. CRITICAL RESULT CALLED TO, READ BACK BY AND VERIFIED WITH: PHARMD J LEDFORD 09/19/2024 @ 2351 BY AB    Enterobacter cloacae complex NOT DETECTED NOT DETECTED Final   Escherichia coli NOT DETECTED NOT DETECTED Final   Klebsiella aerogenes NOT DETECTED NOT DETECTED Final   Klebsiella oxytoca NOT DETECTED NOT DETECTED Final   Klebsiella pneumoniae NOT DETECTED NOT DETECTED Final   Proteus species DETECTED (A) NOT DETECTED Final    Comment: CRITICAL RESULT CALLED TO, READ BACK BY AND VERIFIED WITH: PHARMD J LEDFORD 09/19/2024 @ 2351 BY AB    Salmonella species NOT DETECTED NOT DETECTED Final   Serratia marcescens NOT DETECTED NOT DETECTED Final   Haemophilus influenzae NOT DETECTED NOT DETECTED Final   Neisseria  meningitidis NOT DETECTED NOT DETECTED Final   Pseudomonas aeruginosa NOT DETECTED NOT DETECTED Final   Stenotrophomonas maltophilia NOT DETECTED NOT DETECTED Final   Candida albicans NOT DETECTED NOT DETECTED Final   Candida auris NOT DETECTED NOT DETECTED Final   Candida glabrata NOT DETECTED NOT DETECTED Final   Candida krusei NOT DETECTED NOT DETECTED Final   Candida parapsilosis NOT DETECTED NOT DETECTED Final   Candida tropicalis NOT DETECTED NOT DETECTED Final   Cryptococcus neoformans/gattii NOT DETECTED NOT DETECTED Final   CTX-M ESBL NOT DETECTED NOT DETECTED Final   Carbapenem resistance IMP NOT DETECTED NOT DETECTED Final   Carbapenem resistance KPC NOT DETECTED NOT DETECTED Final   Carbapenem resistance NDM NOT DETECTED NOT DETECTED Final   Carbapenem resist OXA 48 LIKE NOT DETECTED NOT DETECTED Final   Carbapenem resistance VIM NOT DETECTED NOT DETECTED Final    Comment: Performed at [facility], [address] [place], [place] [address]  Surgical pcr screen     Status: Abnormal   Collection Time: 09/22/24  9:40 PM   Specimen: Nasal Mucosa; Nasal Swab  Result Value Ref Range Status   MRSA, PCR POSITIVE (A) NEGATIVE Final   Staphylococcus aureus POSITIVE (A) NEGATIVE Final    Comment: RESULTS CALLLED TO, READ BACK BY AND VERIFIED WITH: RN C.BOKIAGON ON 09/23/24 AT 0018 BY NM (NOTE) The Xpert SA Assay (FDA approved for NASAL specimens in patients 70 years of age and older), is one component of a comprehensive surveillance program. It is not intended to diagnose infection nor to guide or monitor treatment. Performed at [facility], [address] [place], [place] [address]      Labs: BNP (last 3 results) Recent Labs    02/12/24 1638  BNP 387.9*   Basic Metabolic Panel: Recent Labs  Lab  09/19/24 0336 09/19/24 0345 09/19/24 2040 09/20/24 0247 09/21/24 0205 09/23/24 0841 09/24/24 0811 09/25/24 0342  NA 132*   < >  --  135 132* 130* 130* 132*   K 5.8*   < >  --  4.4 4.6 4.9 4.4 4.5  CL 90*  --   --  92* 90* 90* 89* 90*  CO2 21*  --   --  25 24 22 23 22   GLUCOSE 121*  --   --  108* 148* 148* 144* 188*  BUN 71*  --   --  51* 68* 85* 46* 57*  CREATININE 7.74*  --   --  6.39* 7.05* 9.24* 5.96* 6.94*  CALCIUM  8.6*  --   --  8.7* 8.1* 8.6* 8.9 9.1  MG 2.2  --  2.0  --   --   --   --   --   PHOS 6.8*  --   --   --   --   --   --   --    < > = values in this interval not displayed.   Liver Function Tests: Recent Labs  Lab 09/18/24 2019 09/19/24 0336  AST 21 19  ALT 15 14  ALKPHOS 157* 135*  BILITOT 0.7 0.7  PROT 6.4* 5.9*  ALBUMIN  3.8 3.5   No results for input(s): LIPASE, AMYLASE in the last 168 hours. Recent Labs  Lab 09/19/24 0036  AMMONIA 41*   CBC: Recent Labs  Lab 09/18/24 2019 09/19/24 0336 09/19/24 0345 09/21/24 0205 09/22/24 1114 09/23/24 0841 09/24/24 0811 09/25/24 0342  WBC 11.8* 8.6  --  6.1 7.5 8.3 9.9 9.9  NEUTROABS 9.6* 6.8  --   --   --   --   --   --   HGB 11.2* 10.0*   < > 9.6* 11.0* 9.7* 9.8* 10.5*  HCT 35.7* 30.9*   < > 30.1* 34.5* 29.9* 31.0* 32.6*  MCV 89.9 88.3  --  87.2 86.9 86.7 88.3 87.4  PLT 139* 118*  --  146* 175 154 167 187   < > = values in this interval not displayed.   Cardiac Enzymes: No results for input(s): CKTOTAL, CKMB, CKMBINDEX, TROPONINI in the last 168 hours. BNP: Invalid input(s): POCBNP CBG: Recent Labs  Lab 09/23/24 2110 09/24/24 0844 09/24/24 1135 09/24/24 2112 09/25/24 0852  GLUCAP 136* 130* 146* 136* 158*   D-Dimer No results for input(s): DDIMER in the last 72 hours. Hgb A1c No results for input(s): HGBA1C in the last 72 hours. Lipid Profile No results for input(s): CHOL, HDL, LDLCALC, TRIG, CHOLHDL, LDLDIRECT in the last 72 hours. Thyroid  function studies No results for input(s): TSH, T4TOTAL, T3FREE, THYROIDAB in the last 72 hours.  Invalid input(s): FREET3 Anemia work up No results for input(s):  VITAMINB12, FOLATE, FERRITIN, TIBC, IRON, RETICCTPCT in the last 72 hours. Urinalysis    Component Value Date/Time   COLORURINE YELLOW 08/25/2024 1903   APPEARANCEUR TURBID (A) 08/25/2024 1903   LABSPEC 1.020 08/25/2024 1903   PHURINE 6.5 08/25/2024 1903   GLUCOSEU 100 (A) 08/25/2024 1903   HGBUR LARGE (A) 08/25/2024 1903   BILIRUBINUR NEGATIVE 08/25/2024 1903   KETONESUR NEGATIVE 08/25/2024 1903   PROTEINUR >300 (A) 08/25/2024 1903   UROBILINOGEN 0.2 01/14/2015 0346   NITRITE NEGATIVE 08/25/2024 1903   LEUKOCYTESUR MODERATE (A) 08/25/2024 1903   Sepsis Labs Recent Labs  Lab 09/22/24 1114 09/23/24 0841 09/24/24 0811 09/25/24 0342  WBC 7.5 8.3 9.9 9.9   Microbiology Recent Results (  from the past 240 hours)  Resp panel by RT-PCR (RSV, Flu A&B, Covid) Anterior Nasal Swab     Status: None   Collection Time: 09/18/24  7:54 PM   Specimen: Anterior Nasal Swab  Result Value Ref Range Status   SARS Coronavirus 2 by RT PCR NEGATIVE NEGATIVE Final   Influenza A by PCR NEGATIVE NEGATIVE Final   Influenza B by PCR NEGATIVE NEGATIVE Final    Comment: (NOTE) The Xpert Xpress SARS-CoV-2/FLU/RSV plus assay is intended as an aid in the diagnosis of influenza from Nasopharyngeal swab specimens and should not be used as a sole basis for treatment. Nasal washings and aspirates are unacceptable for Xpert Xpress SARS-CoV-2/FLU/RSV testing.  Fact Sheet for Patients: bloggercourse.com  Fact Sheet for Healthcare Providers: seriousbroker.it  This test is not yet approved or cleared by the [place]  FDA and has been authorized for detection and/or diagnosis of SARS-CoV-2 by FDA under an Emergency Use Authorization (EUA). This EUA will remain in effect (meaning this test can be used) for the duration of the COVID-19 declaration under Section 564(b)(1) of the Act, 21 U.S.C. section 360bbb-3(b)(1), unless the authorization is  terminated or revoked.     Resp Syncytial Virus by PCR NEGATIVE NEGATIVE Final    Comment: (NOTE) Fact Sheet for Patients: bloggercourse.com  Fact Sheet for Healthcare Providers: seriousbroker.it  This test is not yet approved or cleared by the [place]  FDA and has been authorized for detection and/or diagnosis of SARS-CoV-2 by FDA under an Emergency Use Authorization (EUA). This EUA will remain in effect (meaning this test can be used) for the duration of the COVID-19 declaration under Section 564(b)(1) of the Act, 21 U.S.C. section 360bbb-3(b)(1), unless the authorization is terminated or revoked.  Performed at [facility], [address] [place], [place] [address]   Blood Culture (routine x 2)     Status: Abnormal   Collection Time: 09/18/24  8:19 PM   Specimen: BLOOD RIGHT ARM  Result Value Ref Range Status   Specimen Description BLOOD RIGHT ARM  Final   Special Requests   Final    BOTTLES DRAWN AEROBIC AND ANAEROBIC Blood Culture adequate volume   Culture  Setup Time   Final    GRAM NEGATIVE RODS AEROBIC BOTTLE ONLY CRITICAL RESULT CALLED TO, READ BACK BY AND VERIFIED WITH: PHARMD J LEDFORD 09/19/2024 @ 2351 BY AB Performed at [facility], [address] [place], [place] [address]    Culture PROTEUS MIRABILIS (A)  Final   Report Status 09/22/2024 FINAL  Final   Organism ID, Bacteria PROTEUS MIRABILIS  Final      Susceptibility   Proteus mirabilis - MIC*    AMPICILLIN  <=2 SENSITIVE Sensitive     CEFAZOLIN  (NON-URINE) 4 INTERMEDIATE Intermediate     CEFEPIME  <=0.12 SENSITIVE Sensitive     ERTAPENEM <=0.12 SENSITIVE Sensitive     CEFTRIAXONE  <=0.25 SENSITIVE Sensitive     CIPROFLOXACIN  >=4 RESISTANT Resistant     GENTAMICIN  8 INTERMEDIATE Intermediate     MEROPENEM  <=0.25 SENSITIVE Sensitive     TRIMETH /SULFA  >=320 RESISTANT Resistant     AMPICILLIN /SULBACTAM <=2 SENSITIVE Sensitive     PIP/TAZO  Value in next row Sensitive      <=4 SENSITIVEThis is a modified FDA-approved test that has been validated and its performance characteristics determined by the reporting laboratory.  This laboratory is certified under the Clinical Laboratory Improvement Amendments CLIA as qualified to perform high complexity clinical laboratory testing.    *  PROTEUS MIRABILIS  Blood Culture ID Panel (Reflexed)     Status: Abnormal   Collection Time: 09/18/24  8:19 PM  Result Value Ref Range Status   Enterococcus faecalis NOT DETECTED NOT DETECTED Final   Enterococcus Faecium NOT DETECTED NOT DETECTED Final   Listeria monocytogenes NOT DETECTED NOT DETECTED Final   Staphylococcus species NOT DETECTED NOT DETECTED Final   Staphylococcus aureus (BCID) NOT DETECTED NOT DETECTED Final   Staphylococcus epidermidis NOT DETECTED NOT DETECTED Final   Staphylococcus lugdunensis NOT DETECTED NOT DETECTED Final   Streptococcus species NOT DETECTED NOT DETECTED Final   Streptococcus agalactiae NOT DETECTED NOT DETECTED Final   Streptococcus pneumoniae NOT DETECTED NOT DETECTED Final   Streptococcus pyogenes NOT DETECTED NOT DETECTED Final   A.calcoaceticus-baumannii NOT DETECTED NOT DETECTED Final   Bacteroides fragilis NOT DETECTED NOT DETECTED Final   Enterobacterales DETECTED (A) NOT DETECTED Final    Comment: Enterobacterales represent a large order of gram negative bacteria, not a single organism. CRITICAL RESULT CALLED TO, READ BACK BY AND VERIFIED WITH: PHARMD J LEDFORD 09/19/2024 @ 2351 BY AB    Enterobacter cloacae complex NOT DETECTED NOT DETECTED Final   Escherichia coli NOT DETECTED NOT DETECTED Final   Klebsiella aerogenes NOT DETECTED NOT DETECTED Final   Klebsiella oxytoca NOT DETECTED NOT DETECTED Final   Klebsiella pneumoniae NOT DETECTED NOT DETECTED Final   Proteus species DETECTED (A) NOT DETECTED Final    Comment: CRITICAL RESULT CALLED TO, READ BACK BY AND VERIFIED WITH: PHARMD J LEDFORD  09/19/2024 @ 2351 BY AB    Salmonella species NOT DETECTED NOT DETECTED Final   Serratia marcescens NOT DETECTED NOT DETECTED Final   Haemophilus influenzae NOT DETECTED NOT DETECTED Final   Neisseria meningitidis NOT DETECTED NOT DETECTED Final   Pseudomonas aeruginosa NOT DETECTED NOT DETECTED Final   Stenotrophomonas maltophilia NOT DETECTED NOT DETECTED Final   Candida albicans NOT DETECTED NOT DETECTED Final   Candida auris NOT DETECTED NOT DETECTED Final   Candida glabrata NOT DETECTED NOT DETECTED Final   Candida krusei NOT DETECTED NOT DETECTED Final   Candida parapsilosis NOT DETECTED NOT DETECTED Final   Candida tropicalis NOT DETECTED NOT DETECTED Final   Cryptococcus neoformans/gattii NOT DETECTED NOT DETECTED Final   CTX-M ESBL NOT DETECTED NOT DETECTED Final   Carbapenem resistance IMP NOT DETECTED NOT DETECTED Final   Carbapenem resistance KPC NOT DETECTED NOT DETECTED Final   Carbapenem resistance NDM NOT DETECTED NOT DETECTED Final   Carbapenem resist OXA 48 LIKE NOT DETECTED NOT DETECTED Final   Carbapenem resistance VIM NOT DETECTED NOT DETECTED Final    Comment: Performed at [facility], [address], [place], [place] [address]  Surgical pcr screen     Status: Abnormal   Collection Time: 09/22/24  9:40 PM   Specimen: Nasal Mucosa; Nasal Swab  Result Value Ref Range Status   MRSA, PCR POSITIVE (A) NEGATIVE Final   Staphylococcus aureus POSITIVE (A) NEGATIVE Final    Comment: RESULTS CALLLED TO, READ BACK BY AND VERIFIED WITH: RN C.BOKIAGON ON 09/23/24 AT 0018 BY NM (NOTE) The Xpert SA Assay (FDA approved for NASAL specimens in patients 31 years of age and older), is one component of a comprehensive surveillance program. It is not intended to diagnose infection nor to guide or monitor treatment. Performed at [facility], [address] [place], [place] [address]     Please note: You were cared for by a hospitalist during your hospital stay.  Once you are discharged, your primary care physician will handle any further medical issues. Please note that NO REFILLS for any discharge medications will be authorized once you are discharged, as it is imperative that you return to your primary care physician (or establish a relationship with a primary care physician if you do not have one) for your post hospital discharge needs so that they can reassess your need for medications and monitor your lab values.    Time coordinating discharge: 40 minutes  SIGNED:   Ivonne Mustache, MD  [facility] 09/25/2024, 10:48 AM Pager [phone]  If 7PM-7AM, please contact night-coverage [email or web address] Password [email or web address]    [1]  Allergies Allergen Reactions   Contrast Media [Iodinated Contrast Media] Shortness Of Breath and Other (See Comments)    Altered mental status     Ivp Dye [Iodinated Contrast Media] Anaphylaxis, Shortness Of Breath and Other (See Comments)    Altered mental status     Latex Rash    severe rash appears after the first 24 hours of being placed   Tape Rash and Other (See Comments)    Rash after 1 day of use  Do not leave on longer then 3 days without changing

## 2024-09-25 NOTE — Procedures (Shared)
 [name] to be discharged Skilled nursing facility [facility]  per MD order. Patient verbalized understanding.  Skin clean, dry and intact without evidence of skin break down, no evidence of skin tears noted. IV catheter discontinued intact. Site without signs and symptoms of complications. Dressing and pressure applied. Pt denies pain at the site currently. No complaints noted.  Patient free of lines, drains, and wounds.   Discharge packet assembled. An After Visit Summary (AVS) was printed and given to the EMS personnel. Patient escorted via stretcher and discharged to [name] via ambulance. Report called to accepting facility; all questions and concerns addressed.   Franciso Bodily, RN

## 2024-09-25 NOTE — Progress Notes (Signed)
 " [facility] Progress Note   Subjective:    Seen and examined patient at bedside. He reports feeling fine. He denies SOB, CP, and N/V. BP/volume are acceptable and labs are stable. Noted he received HD early yesterday morning (4am) and refused HD yesterday evening. I feel he's actually stable for discharge back to SNF from a renal standpoint.  Objective Vitals:   09/24/24 1644 09/24/24 2109 09/25/24 0431 09/25/24 0852  BP: 112/71 117/72 132/71 101/72  Pulse: (!) 108 89 89 77  Resp: 19 18 20    Temp: 98 F (36.7 C) 98.7 F (37.1 C) 98.6 F (37 C) 98.5 F (36.9 C)  TempSrc:   Oral Oral  SpO2: 100% 94% 99% 97%  Weight:      Height:       Physical Exam General: Awake, alert, NAD Heart: RRR Lungs: unlabored, normal wob Abdomen: Soft and non-tender Extremities: B/l BKAs, dressing over left hand Dialysis Access: L AVF   Filed Weights   09/21/24 0500 09/23/24 2356 09/24/24 0426  Weight: 111.5 kg 114 kg 112.4 kg    Intake/Output Summary (Last 24 hours) at 09/25/2024 1035 Last data filed at 09/25/2024 0900 Gross per 24 hour  Intake 200 ml  Output 0 ml  Net 200 ml    Additional Objective Labs: Basic Metabolic Panel: Recent Labs  Lab 09/19/24 0336 09/19/24 0345 09/23/24 0841 09/24/24 0811 09/25/24 0342  NA 132*   < > 130* 130* 132*  K 5.8*   < > 4.9 4.4 4.5  CL 90*   < > 90* 89* 90*  CO2 21*   < > 22 23 22   GLUCOSE 121*   < > 148* 144* 188*  BUN 71*   < > 85* 46* 57*  CREATININE 7.74*   < > 9.24* 5.96* 6.94*  CALCIUM  8.6*   < > 8.6* 8.9 9.1  PHOS 6.8*  --   --   --   --    < > = values in this interval not displayed.   Liver Function Tests: Recent Labs  Lab 09/18/24 2019 09/19/24 0336  AST 21 19  ALT 15 14  ALKPHOS 157* 135*  BILITOT 0.7 0.7  PROT 6.4* 5.9*  ALBUMIN  3.8 3.5   No results for input(s): LIPASE, AMYLASE in the last 168 hours. CBC: Recent Labs  Lab 09/18/24 2019 09/19/24 0336 09/19/24 0345 09/21/24 0205 09/22/24 1114  09/23/24 0841 09/24/24 0811 09/25/24 0342  WBC 11.8* 8.6  --  6.1 7.5 8.3 9.9 9.9  NEUTROABS 9.6* 6.8  --   --   --   --   --   --   HGB 11.2* 10.0*   < > 9.6* 11.0* 9.7* 9.8* 10.5*  HCT 35.7* 30.9*   < > 30.1* 34.5* 29.9* 31.0* 32.6*  MCV 89.9 88.3  --  87.2 86.9 86.7 88.3 87.4  PLT 139* 118*  --  146* 175 154 167 187   < > = values in this interval not displayed.   Blood Culture    Component Value Date/Time   SDES BLOOD RIGHT ARM 09/18/2024 2019   SPECREQUEST  09/18/2024 2019    BOTTLES DRAWN AEROBIC AND ANAEROBIC Blood Culture adequate volume   CULT PROTEUS MIRABILIS (A) 09/18/2024 2019   REPTSTATUS 09/22/2024 FINAL 09/18/2024 2019    Cardiac Enzymes: No results for input(s): CKTOTAL, CKMB, CKMBINDEX, TROPONINI in the last 168 hours. CBG: Recent Labs  Lab 09/23/24 2110 09/24/24 0844 09/24/24 1135 09/24/24 2112 09/25/24 0852  GLUCAP  136* 130* 146* 136* 158*   Iron Studies: No results for input(s): IRON, TIBC, TRANSFERRIN, FERRITIN in the last 72 hours. Lab Results  Component Value Date   INR 2.4 (H) 09/19/2024   INR 2.2 (H) 09/18/2024   INR 1.3 (H) 08/25/2024   Studies/Results: No results found.  Medications:   amoxicillin   500 mg Oral Q12H   apixaban   5 mg Oral BID   atorvastatin   80 mg Oral QHS   carvedilol   12.5 mg Oral BID WC   Chlorhexidine  Gluconate Cloth  6 each Topical Q0600   Chlorhexidine  Gluconate Cloth  6 each Topical Daily   collagenase    Topical Daily   ferrous sulfate   325 mg Oral Q breakfast   insulin  aspart  0-6 Units Subcutaneous TID WC   mupirocin  ointment  1 Application Nasal BID   sevelamer  carbonate  800 mg Oral TID WC    Dialysis Orders: [facility]   B450   106kg  2K bath  AVF   Hep none Last OP HD 1/06, post wt 106kg  Assessment/Plan:  Acute encephalopathy - resolved.  Likely 2/2 PNA and bacteremia.    PNA - in LLL on CXR.  S/p abx   Bacteremia - blood cultures positive for proteus. 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 Noted he refused HD yesterday evening. Next HD tomorrow in outpatient.   Anemia of CKD- Hgb 9.8. No indication for ESA yet, avoid IV Fe   Secondary hyperparathyroidism - Ca in goal. Phos elevated. Continue binders.   HTN/volume - Blood pressure soft/stable for now, monitor closely. Does not appear volume overloaded.  UF as tolerated.    Nutrition - Renal diet w/fluid restrictions.  Dispo - I feel patient is stable for discharge from a renal standpoint. BP/volume and labs are stable. He's on RA. He can resume HD tomorrow in outpatient.     Charmaine Piety, NP [facility] 09/25/2024,10:35 AM  LOS: 7 days    "

## 2024-09-26 NOTE — Discharge Planning (Signed)
 [facility] Patient Discharge Orders- [facility] CLINIC: [place]  Patient's name: [name] Admit/DC Dates: 09/18/2024 - 09/25/2024  Discharge Diagnoses: Acute metabolic encephalopathy -  MRI done on 12/18 showed chronic encephalomalacia changes in the right parietal lobe and to a lesser extent the right frontal lobe. 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 On PO Amoxicillin  (dosed correctly)  On Coumadin?: No, on Eliquis     D/C Meds to be reconciled by nurse after every discharge.  Completed By: Charmaine Piety, NP   Reviewed by: MD:______ RN_______

## 2024-09-30 ENCOUNTER — Encounter: Admitting: Orthopedic Surgery

## 2024-10-06 ENCOUNTER — Other Ambulatory Visit: Payer: Self-pay

## 2024-10-06 ENCOUNTER — Inpatient Hospital Stay (HOSPITAL_COMMUNITY)

## 2024-10-06 ENCOUNTER — Emergency Department (HOSPITAL_COMMUNITY)

## 2024-10-06 ENCOUNTER — Encounter (HOSPITAL_COMMUNITY): Payer: Self-pay

## 2024-10-06 ENCOUNTER — Inpatient Hospital Stay (HOSPITAL_COMMUNITY)
Admission: EM | Admit: 2024-10-06 | Source: Home / Self Care | Attending: Internal Medicine | Admitting: Internal Medicine

## 2024-10-06 DIAGNOSIS — Z794 Long term (current) use of insulin: Secondary | ICD-10-CM

## 2024-10-06 DIAGNOSIS — I509 Heart failure, unspecified: Secondary | ICD-10-CM | POA: Diagnosis not present

## 2024-10-06 DIAGNOSIS — I132 Hypertensive heart and chronic kidney disease with heart failure and with stage 5 chronic kidney disease, or end stage renal disease: Secondary | ICD-10-CM | POA: Diagnosis not present

## 2024-10-06 DIAGNOSIS — R6520 Severe sepsis without septic shock: Secondary | ICD-10-CM

## 2024-10-06 DIAGNOSIS — N186 End stage renal disease: Secondary | ICD-10-CM | POA: Diagnosis not present

## 2024-10-06 DIAGNOSIS — J189 Pneumonia, unspecified organism: Secondary | ICD-10-CM

## 2024-10-06 DIAGNOSIS — R4182 Altered mental status, unspecified: Secondary | ICD-10-CM | POA: Diagnosis not present

## 2024-10-06 DIAGNOSIS — I5030 Unspecified diastolic (congestive) heart failure: Secondary | ICD-10-CM | POA: Diagnosis not present

## 2024-10-06 DIAGNOSIS — J9600 Acute respiratory failure, unspecified whether with hypoxia or hypercapnia: Secondary | ICD-10-CM | POA: Diagnosis not present

## 2024-10-06 DIAGNOSIS — E1122 Type 2 diabetes mellitus with diabetic chronic kidney disease: Secondary | ICD-10-CM

## 2024-10-06 DIAGNOSIS — A419 Sepsis, unspecified organism: Principal | ICD-10-CM

## 2024-10-06 DIAGNOSIS — G928 Other toxic encephalopathy: Secondary | ICD-10-CM | POA: Diagnosis not present

## 2024-10-06 DIAGNOSIS — A4159 Other Gram-negative sepsis: Secondary | ICD-10-CM

## 2024-10-06 DIAGNOSIS — E440 Moderate protein-calorie malnutrition: Secondary | ICD-10-CM | POA: Insufficient documentation

## 2024-10-06 DIAGNOSIS — Z992 Dependence on renal dialysis: Secondary | ICD-10-CM | POA: Diagnosis not present

## 2024-10-06 DIAGNOSIS — D631 Anemia in chronic kidney disease: Secondary | ICD-10-CM

## 2024-10-06 DIAGNOSIS — G9341 Metabolic encephalopathy: Secondary | ICD-10-CM | POA: Diagnosis present

## 2024-10-06 DIAGNOSIS — G9340 Encephalopathy, unspecified: Secondary | ICD-10-CM | POA: Diagnosis present

## 2024-10-06 DIAGNOSIS — T426X4A Poisoning by other antiepileptic and sedative-hypnotic drugs, undetermined, initial encounter: Secondary | ICD-10-CM

## 2024-10-06 LAB — I-STAT VENOUS BLOOD GAS, ED
Acid-Base Excess: 3 mmol/L — ABNORMAL HIGH (ref 0.0–2.0)
Bicarbonate: 29.4 mmol/L — ABNORMAL HIGH (ref 20.0–28.0)
Calcium, Ion: 1.02 mmol/L — ABNORMAL LOW (ref 1.15–1.40)
HCT: 34 % — ABNORMAL LOW (ref 39.0–52.0)
Hemoglobin: 11.6 g/dL — ABNORMAL LOW (ref 13.0–17.0)
O2 Saturation: 45 %
Potassium: 4.8 mmol/L (ref 3.5–5.1)
Sodium: 142 mmol/L (ref 135–145)
TCO2: 31 mmol/L (ref 22–32)
pCO2, Ven: 52.4 mmHg (ref 44–60)
pH, Ven: 7.356 (ref 7.25–7.43)
pO2, Ven: 27 mmHg — CL (ref 32–45)

## 2024-10-06 LAB — I-STAT CHEM 8, ED
BUN: 54 mg/dL — ABNORMAL HIGH (ref 8–23)
Calcium, Ion: 1.03 mmol/L — ABNORMAL LOW (ref 1.15–1.40)
Chloride: 100 mmol/L (ref 98–111)
Creatinine, Ser: 8.4 mg/dL — ABNORMAL HIGH (ref 0.61–1.24)
Glucose, Bld: 160 mg/dL — ABNORMAL HIGH (ref 70–99)
HCT: 31 % — ABNORMAL LOW (ref 39.0–52.0)
Hemoglobin: 10.5 g/dL — ABNORMAL LOW (ref 13.0–17.0)
Potassium: 4.6 mmol/L (ref 3.5–5.1)
Sodium: 141 mmol/L (ref 135–145)
TCO2: 29 mmol/L (ref 22–32)

## 2024-10-06 LAB — CBC WITH DIFFERENTIAL/PLATELET
Abs Immature Granulocytes: 0.04 10*3/uL (ref 0.00–0.07)
Basophils Absolute: 0.1 10*3/uL (ref 0.0–0.1)
Basophils Relative: 1 %
Eosinophils Absolute: 0.1 10*3/uL (ref 0.0–0.5)
Eosinophils Relative: 1 %
HCT: 33 % — ABNORMAL LOW (ref 39.0–52.0)
Hemoglobin: 10.1 g/dL — ABNORMAL LOW (ref 13.0–17.0)
Immature Granulocytes: 0 %
Lymphocytes Relative: 13 %
Lymphs Abs: 1.2 10*3/uL (ref 0.7–4.0)
MCH: 29.4 pg (ref 26.0–34.0)
MCHC: 30.6 g/dL (ref 30.0–36.0)
MCV: 95.9 fL (ref 80.0–100.0)
Monocytes Absolute: 0.8 10*3/uL (ref 0.1–1.0)
Monocytes Relative: 8 %
Neutro Abs: 7.4 10*3/uL (ref 1.7–7.7)
Neutrophils Relative %: 77 %
Platelets: 164 10*3/uL (ref 150–400)
RBC: 3.44 MIL/uL — ABNORMAL LOW (ref 4.22–5.81)
RDW: 20.8 % — ABNORMAL HIGH (ref 11.5–15.5)
WBC: 9.5 10*3/uL (ref 4.0–10.5)
nRBC: 0 % (ref 0.0–0.2)

## 2024-10-06 LAB — I-STAT ARTERIAL BLOOD GAS, ED
Acid-Base Excess: 1 mmol/L (ref 0.0–2.0)
Bicarbonate: 25.5 mmol/L (ref 20.0–28.0)
Calcium, Ion: 1.05 mmol/L — ABNORMAL LOW (ref 1.15–1.40)
HCT: 33 % — ABNORMAL LOW (ref 39.0–52.0)
Hemoglobin: 11.2 g/dL — ABNORMAL LOW (ref 13.0–17.0)
O2 Saturation: 100 %
Potassium: 4.6 mmol/L (ref 3.5–5.1)
Sodium: 140 mmol/L (ref 135–145)
TCO2: 27 mmol/L (ref 22–32)
pCO2 arterial: 37.2 mmHg (ref 32–48)
pH, Arterial: 7.444 (ref 7.35–7.45)
pO2, Arterial: 414 mmHg — ABNORMAL HIGH (ref 83–108)

## 2024-10-06 LAB — COMPREHENSIVE METABOLIC PANEL WITH GFR
ALT: 13 U/L (ref 0–44)
AST: 19 U/L (ref 15–41)
Albumin: 3.3 g/dL — ABNORMAL LOW (ref 3.5–5.0)
Alkaline Phosphatase: 128 U/L — ABNORMAL HIGH (ref 38–126)
Anion gap: 18 — ABNORMAL HIGH (ref 5–15)
BUN: 59 mg/dL — ABNORMAL HIGH (ref 8–23)
CO2: 25 mmol/L (ref 22–32)
Calcium: 8.7 mg/dL — ABNORMAL LOW (ref 8.9–10.3)
Chloride: 99 mmol/L (ref 98–111)
Creatinine, Ser: 8.04 mg/dL — ABNORMAL HIGH (ref 0.61–1.24)
GFR, Estimated: 7 mL/min — ABNORMAL LOW
Glucose, Bld: 167 mg/dL — ABNORMAL HIGH (ref 70–99)
Potassium: 4.9 mmol/L (ref 3.5–5.1)
Sodium: 141 mmol/L (ref 135–145)
Total Bilirubin: 0.7 mg/dL (ref 0.0–1.2)
Total Protein: 5.9 g/dL — ABNORMAL LOW (ref 6.5–8.1)

## 2024-10-06 LAB — I-STAT CG4 LACTIC ACID, ED
Lactic Acid, Venous: 2.2 mmol/L (ref 0.5–1.9)
Lactic Acid, Venous: 0.8 mmol/L (ref 0.5–1.9)
Lactic Acid, Venous: 1.2 mmol/L (ref 0.5–1.9)

## 2024-10-06 LAB — RESP PANEL BY RT-PCR (RSV, FLU A&B, COVID)  RVPGX2
Influenza A by PCR: NEGATIVE
Influenza B by PCR: NEGATIVE
Resp Syncytial Virus by PCR: NEGATIVE
SARS Coronavirus 2 by RT PCR: NEGATIVE

## 2024-10-06 LAB — BASIC METABOLIC PANEL WITH GFR
Anion gap: 19 — ABNORMAL HIGH (ref 5–15)
BUN: 60 mg/dL — ABNORMAL HIGH (ref 8–23)
CO2: 24 mmol/L (ref 22–32)
Calcium: 8.1 mg/dL — ABNORMAL LOW (ref 8.9–10.3)
Chloride: 100 mmol/L (ref 98–111)
Creatinine, Ser: 7.57 mg/dL — ABNORMAL HIGH (ref 0.61–1.24)
GFR, Estimated: 7 mL/min — ABNORMAL LOW
Glucose, Bld: 158 mg/dL — ABNORMAL HIGH (ref 70–99)
Potassium: 4.8 mmol/L (ref 3.5–5.1)
Sodium: 143 mmol/L (ref 135–145)

## 2024-10-06 LAB — GLUCOSE, CAPILLARY
Glucose-Capillary: 153 mg/dL — ABNORMAL HIGH (ref 70–99)
Glucose-Capillary: 156 mg/dL — ABNORMAL HIGH (ref 70–99)
Glucose-Capillary: 158 mg/dL — ABNORMAL HIGH (ref 70–99)

## 2024-10-06 LAB — HEPATITIS B SURFACE ANTIGEN: Hepatitis B Surface Ag: NONREACTIVE

## 2024-10-06 LAB — MRSA NEXT GEN BY PCR, NASAL: MRSA by PCR Next Gen: DETECTED — AB

## 2024-10-06 LAB — PROTIME-INR
INR: 2.9 — ABNORMAL HIGH (ref 0.8–1.2)
Prothrombin Time: 31.5 s — ABNORMAL HIGH (ref 11.4–15.2)

## 2024-10-06 LAB — CBG MONITORING, ED: Glucose-Capillary: 138 mg/dL — ABNORMAL HIGH (ref 70–99)

## 2024-10-06 LAB — MAGNESIUM: Magnesium: 2.2 mg/dL (ref 1.7–2.4)

## 2024-10-06 LAB — PRO BRAIN NATRIURETIC PEPTIDE: Pro Brain Natriuretic Peptide: >35000 pg/mL — ABNORMAL HIGH

## 2024-10-06 LAB — AMMONIA: Ammonia: 63 umol/L — ABNORMAL HIGH (ref 9–35)

## 2024-10-06 MED ORDER — MUPIROCIN 2 % EX OINT
TOPICAL_OINTMENT | Freq: Two times a day (BID) | CUTANEOUS | Status: AC
  Administered 2024-10-09 – 2024-10-10 (×2): 1 via NASAL
  Filled 2024-10-06 (×3): qty 22

## 2024-10-06 MED ORDER — VANCOMYCIN HCL 2000 MG/400ML IV SOLN
2000.0000 mg | Freq: Once | INTRAVENOUS | Status: AC
  Administered 2024-10-06: 2000 mg via INTRAVENOUS
  Filled 2024-10-06: qty 400

## 2024-10-06 MED ORDER — SODIUM CHLORIDE 0.9 % IV BOLUS
500.0000 mL | Freq: Once | INTRAVENOUS | Status: AC
  Administered 2024-10-06: 500 mL via INTRAVENOUS

## 2024-10-06 MED ORDER — NALOXONE HCL 2 MG/2ML IJ SOSY
PREFILLED_SYRINGE | INTRAMUSCULAR | Status: DC | PRN
  Administered 2024-10-06: 4 mg via INTRAVENOUS

## 2024-10-06 MED ORDER — FENTANYL CITRATE (PF) 50 MCG/ML IJ SOSY: 50.0000 ug | PREFILLED_SYRINGE | INTRAMUSCULAR | Status: DC | PRN

## 2024-10-06 MED ORDER — ETOMIDATE 2 MG/ML IV SOLN
INTRAVENOUS | Status: DC | PRN
  Administered 2024-10-06: 20 mg via INTRAVENOUS

## 2024-10-06 MED ORDER — CHLORHEXIDINE GLUCONATE CLOTH 2 % EX PADS
6.0000 | MEDICATED_PAD | Freq: Every day | CUTANEOUS | Status: AC
  Administered 2024-10-06 – 2024-10-15 (×9): 6 via TOPICAL

## 2024-10-06 MED ORDER — HEPARIN (PORCINE) 25000 UT/250ML-% IV SOLN
1700.0000 [IU]/h | INTRAVENOUS | Status: DC
  Administered 2024-10-06: 1250 [IU]/h via INTRAVENOUS
  Administered 2024-10-07: 1450 [IU]/h via INTRAVENOUS
  Administered 2024-10-08: 1700 [IU]/h via INTRAVENOUS
  Filled 2024-10-06 (×3): qty 250

## 2024-10-06 MED ORDER — NALOXONE HCL 0.4 MG/ML IJ SOLN
0.4000 mg | Freq: Once | INTRAMUSCULAR | Status: AC
  Administered 2024-10-06: 0.4 mg via INTRAVENOUS

## 2024-10-06 MED ORDER — DEXMEDETOMIDINE HCL IN NACL 400 MCG/100ML IV SOLN
0.0000 ug/kg/h | INTRAVENOUS | Status: DC
  Administered 2024-10-06 (×2): 0.9 ug/kg/h via INTRAVENOUS
  Administered 2024-10-06: 0.8 ug/kg/h via INTRAVENOUS
  Administered 2024-10-06: 1.2 ug/kg/h via INTRAVENOUS
  Administered 2024-10-07: 0.8 ug/kg/h via INTRAVENOUS
  Administered 2024-10-07: 0.7 ug/kg/h via INTRAVENOUS
  Filled 2024-10-06 (×6): qty 100

## 2024-10-06 MED ORDER — SODIUM CHLORIDE 0.9 % IV SOLN: 2.0000 g | Freq: Once | INTRAVENOUS | Status: DC

## 2024-10-06 MED ORDER — FENTANYL CITRATE (PF) 50 MCG/ML IJ SOSY
50.0000 ug | PREFILLED_SYRINGE | INTRAMUSCULAR | Status: DC | PRN
  Administered 2024-10-06: 100 ug via INTRAVENOUS
  Filled 2024-10-06: qty 2

## 2024-10-06 MED ORDER — ATORVASTATIN CALCIUM 40 MG PO TABS
80.0000 mg | ORAL_TABLET | Freq: Every day | ORAL | Status: DC
  Administered 2024-10-06 – 2024-10-07 (×2): 80 mg
  Filled 2024-10-06 (×2): qty 2

## 2024-10-06 MED ORDER — PIPERACILLIN-TAZOBACTAM IN DEX 2-0.25 GM/50ML IV SOLN
2.2500 g | Freq: Three times a day (TID) | INTRAVENOUS | Status: DC
  Administered 2024-10-06 – 2024-10-08 (×5): 2.25 g via INTRAVENOUS
  Filled 2024-10-06 (×7): qty 50

## 2024-10-06 MED ORDER — SODIUM CHLORIDE 0.9 % IV BOLUS: 1000.0000 mL | Freq: Once | INTRAVENOUS | Status: DC

## 2024-10-06 MED ORDER — ORAL CARE MOUTH RINSE: 15.0000 mL | OROMUCOSAL | Status: DC | PRN

## 2024-10-06 MED ORDER — CHLORHEXIDINE GLUCONATE CLOTH 2 % EX PADS: 6.0000 | MEDICATED_PAD | Freq: Every day | CUTANEOUS | Status: DC

## 2024-10-06 MED ORDER — ROCURONIUM BROMIDE 10 MG/ML (PF) SYRINGE
PREFILLED_SYRINGE | INTRAVENOUS | Status: DC | PRN
  Administered 2024-10-06: 100 mg via INTRAVENOUS

## 2024-10-06 MED ORDER — IPRATROPIUM-ALBUTEROL 0.5-2.5 (3) MG/3ML IN SOLN
3.0000 mL | RESPIRATORY_TRACT | Status: DC
  Administered 2024-10-06 – 2024-10-08 (×11): 3 mL via RESPIRATORY_TRACT
  Filled 2024-10-06 (×11): qty 3

## 2024-10-06 MED ORDER — ORAL CARE MOUTH RINSE
15.0000 mL | OROMUCOSAL | Status: DC
  Administered 2024-10-06 – 2024-10-08 (×21): 15 mL via OROMUCOSAL

## 2024-10-06 MED ORDER — VANCOMYCIN HCL IN DEXTROSE 1-5 GM/200ML-% IV SOLN: 1000.0000 mg | Freq: Once | INTRAVENOUS | Status: DC

## 2024-10-06 MED ORDER — CALCIUM GLUCONATE-NACL 1-0.675 GM/50ML-% IV SOLN
1.0000 g | Freq: Once | INTRAVENOUS | Status: AC
  Administered 2024-10-06: 1000 mg via INTRAVENOUS
  Filled 2024-10-06: qty 50

## 2024-10-06 MED ORDER — LACTULOSE 10 GM/15ML PO SOLN
10.0000 g | Freq: Two times a day (BID) | ORAL | Status: DC
  Administered 2024-10-06 – 2024-10-07 (×3): 10 g
  Filled 2024-10-06 (×4): qty 15

## 2024-10-06 MED ORDER — PANTOPRAZOLE SODIUM 40 MG IV SOLR
40.0000 mg | Freq: Every day | INTRAVENOUS | Status: DC
  Administered 2024-10-06 – 2024-10-08 (×3): 40 mg via INTRAVENOUS
  Filled 2024-10-06 (×3): qty 10

## 2024-10-06 MED ORDER — SODIUM CHLORIDE 0.9 % IV SOLN
1.0000 g | Freq: Once | INTRAVENOUS | Status: AC
  Administered 2024-10-06: 1 g via INTRAVENOUS
  Filled 2024-10-06: qty 10

## 2024-10-06 MED ORDER — VANCOMYCIN HCL IN DEXTROSE 1-5 GM/200ML-% IV SOLN: 1000.0000 mg | INTRAVENOUS | Status: DC

## 2024-10-06 NOTE — Progress Notes (Unsigned)
 "  [name] - 69 y.o. male MRN [id number]  Date of birth: 1955-09-29  Office Visit Note: Visit Date: 10/07/2024 PCP: Montey Lot, PA-C Referred by: Montey Lot, PA-C  Subjective:  HPI: [name] is a 69 y.o. male who presents today for follow up *** status post ***.  Pertinent ROS were reviewed with the patient and found to be negative unless otherwise specified above in HPI.   Assessment & Plan: Visit Diagnoses: No diagnosis found.  Plan: ***  Follow-up: No follow-ups on file.   Meds & Orders: No orders of the defined types were placed in this encounter.  No orders of the defined types were placed in this encounter.    Procedures: No procedures performed       Objective:   Vital Signs: There were no vitals taken for this visit.  Ortho Exam ***  Imaging: CT HEAD WO CONTRAST ( ) Result Date: 10/06/2024 EXAM: CT HEAD WITHOUT CONTRAST 10/06/2024 01:14:00 PM TECHNIQUE: CT of the head was performed without the administration of intravenous contrast. Automated exposure control, iterative reconstruction, and/or weight based adjustment of the mA/kV was utilized to reduce the radiation dose to as low as reasonably achievable. COMPARISON: 08/25/2024 CLINICAL HISTORY: Altered mental status, on Eliquis . FINDINGS: BRAIN AND VENTRICLES: No acute hemorrhage. No evidence of acute infarct. Scattered periventricular white matter hypoattenuation, consistent with mild chronic ischemic microvascular disease. Encephalomalacia in the posterior and inferior right parietal lobe compatible with remote infarct. Additional encephalomalacia involving the left middle frontal gyrus and centrum semiovale. Mild volume loss. Additional small remote cortical infarcts in the posterior aspect of the left superior temporal gyrus and within the lateral aspect of the left occipital lobe. No hydrocephalus. No extra-axial collection. No mass effect or midline shift. ORBITS: Bilateral lens replacements  noted. Calcified atherosclerotic plaque within cavernous/supraclinoid internal carotid arteries. SINUSES: Trace left mastoid effusion. Mild mucosal thickening in the ethmoid sinuses and left maxillary sinus. Trace fluid layering in the nasopharynx. SOFT TISSUES AND SKULL: No acute soft tissue abnormality. No skull fracture. IMPRESSION: 1. No acute intracranial abnormality. 2. Multiple remote infarcts as above. Electronically signed by: Donnice Mania MD 10/06/2024 01:32 PM EST RP Workstation: [id number]   DG Chest Portable 1 View Result Date: 10/06/2024 CLINICAL DATA:  Altered mental status, lethargy and hypoxia. Status post intubation and placement of orogastric tube. EXAM: PORTABLE CHEST 1 VIEW COMPARISON:  Film at 0830 hours FINDINGS: Endotracheal tube present with tip approximately 4.5 cm above the carina. Orogastric tube extends below the diaphragm into the stomach. Stable significant cardiac enlargement. Stable left lower lobe atelectasis/consolidation. Potential component of left pleural fluid. No overt pulmonary edema or pneumothorax. The visualized skeletal structures are unremarkable. IMPRESSION: Endotracheal tube tip approximately 4.5 cm above the carina. Orogastric tube extends into the stomach. Stable left lower lobe atelectasis/consolidation. Electronically Signed   By: Marcey Moan M.D.   On: 10/06/2024 13:25   DG Abdomen 1 View Result Date: 10/06/2024 CLINICAL DATA:  Gastric tube placement EXAM: ABDOMEN - 1 VIEW COMPARISON:  Chest x-ray obtained earlier today FINDINGS: The tip of the gastric tube overlies the GE junction. Dense opacification of the left lung base. Tortuous and calcified splenic artery noted incidentally. Surgical clips in the right upper quadrant. No evidence of bowel obstruction. Partially imaged lumbosacral fixation hardware. IMPRESSION: The tip of the gastric tube overlies the GE junction. Recommend advancing 5-10 cm for more optimal placement. Partially imaged dense  opacification of the left lung base may reflect pleural fluid with atelectasis  or infiltrate. Electronically Signed   By: Wilkie Lent M.D.   On: 10/06/2024 13:21   DG Hand Complete Left Result Date: 10/06/2024 EXAM: 3 OR MORE VIEW(S) XRAY OF THE LEFT HAND 10/06/2024 08:48:00 AM COMPARISON: 08/31/2024 CLINICAL HISTORY: 69 year old male with a wound. FINDINGS: BONES AND JOINTS: Limited evaluation of the digits because of flexion on all views. Mild degenerative changes. No acute fracture. No malalignment. SOFT TISSUES: 3 mm metallic density along the dorsal medial aspect of the wrist, chronic and stable retained foreign body. Extensive vascular calcifications. Generalized soft tissue swelling at the proximal hand and wrist has increased since last month. IMPRESSION: 1. No acute osseous abnormality identified about the left hand. 2. Generalized proximal hand and wrist soft tissue swelling is new or increased. 3. Very severe calcified peripheral vascular disease. Electronically signed by: Helayne Hurst MD 10/06/2024 09:08 AM EST RP Workstation: [id number]   DG Hand 2 View Right Result Date: 10/06/2024 EXAM: 1 or 2 VIEW(S) XRAY OF THE RIGHT HAND 10/06/2024 08:48:00 AM COMPARISON: Right hand series 08/31/2024. CLINICAL HISTORY: 69 year old male with an extremity wound. FINDINGS: BONES AND JOINTS: No acute fracture. No malalignment. Moderate first CMC degeneration. Mild first MCP and IP osteoarthritis. SOFT TISSUES: Pulse oximeter artifact over the right 2nd finger. Incidental intravenous access artifact at the wrist. Vascular calcifications. IMPRESSION: 1. No acute osseous abnormality identified about the right hand. 2. Very severe calcified peripheral vascular disease. Electronically signed by: Helayne Hurst MD 10/06/2024 09:06 AM EST RP Workstation: [id number]   DG Chest Port 1 View Result Date: 10/06/2024 EXAM: 1 VIEW(S) XRAY OF THE CHEST 10/06/2024 08:48:00 AM COMPARISON: 09/18/2024 CLINICAL HISTORY:  69 year old male with query sepsis, increasing confusion, and lethargy for 3 days. Evaluate for abnormality. FINDINGS: LUNGS AND PLEURA: Ongoing and mildly progressed dense left lung base opacification obscuring the left hemidiaphragm. This may all be pleural effusion, but a combination of consolidation and pleural effusion is possible. Stable nearby linear platelike atelectasis. Streaky right lung base opacity also appears increased. No pneumothorax. HEART AND MEDIASTINUM: Cardiomegaly. Calcified aorta. BONES AND SOFT TISSUES: Cervical fusion hardware noted. No acute osseous abnormality. ABDOMEN: Paucity of visible bowel gas in the abdomen. IMPRESSION: 1. Increasing dense left lung base opacity, likely in part due to pleural effusion, but also suspicious for pneumonia. Increasing streaky right lung base opacity, atelectasis vs infection. 2. Cardiomegaly. Electronically signed by: Helayne Hurst MD 10/06/2024 09:05 AM EST RP Workstation: [id number]     Gildardo Afton Alderton, M.D. [facility], Hand Surgery  "

## 2024-10-06 NOTE — Procedures (Signed)
 Intubation Procedure Note  [name]  [id number]  21-Jul-1956  Date:10/06/24  Time:2:23 PM   Provider Performing:Taggert Bozzi    Procedure: Intubation (31500)  Indication(s) Respiratory Failure  Consent Risks of the procedure as well as the alternatives and risks of each were explained to the patient and/or caregiver.  Consent for the procedure was obtained and is signed in the bedside chart   Anesthesia Etomidate  and Rocuronium    Time Out Verified patient identification, verified procedure, site/side was marked, verified correct patient position, special equipment/implants available, medications/allergies/relevant history reviewed, required imaging and test results available.   Sterile Technique Usual hand hygeine, masks, and gloves were used   Procedure Description Patient positioned in bed supine.  Sedation given as noted above.  Patient was intubated with endotracheal tube using Glidescope.  View was Grade 1 full glottis .  Number of attempts was 1.  Colorimetric CO2 detector was consistent with tracheal placement.   Complications/Tolerance None; patient tolerated the procedure well. Chest X-ray is ordered to verify placement.   EBL none   Specimen(s) None

## 2024-10-06 NOTE — Progress Notes (Signed)
 PHARMACY - ANTICOAGULATION CONSULT NOTE  Pharmacy Consult for heparin  Indication: atrial fibrillation  Allergies[1]  Patient Measurements: Height: 6' 2 (188 cm) Weight: 89.5 kg (197 lb 5 oz) IBW/kg (Calculated) : 82.2 HEPARIN  DW (KG): 89.5  Vital Signs: Temp: 99.9 F (37.7 C) (01/27 1600) Temp Source: Axillary (01/27 1600) BP: 133/78 (01/27 1600) Pulse Rate: 93 (01/27 1600)  Labs: Recent Labs    10/06/24 0809 10/06/24 0845 10/06/24 1215 10/06/24 1323 10/06/24 1455  HGB 10.1* 10.5* 11.6* 11.2*  --   HCT 33.0* 31.0* 34.0* 33.0*  --   PLT 164  --   --   --   --   LABPROT 31.5*  --   --   --   --   INR 2.9*  --   --   --   --   CREATININE 8.04* 8.40*  --   --  7.57*    Estimated Creatinine Clearance: 10.6 mL/min (A) (by C-G formula based on SCr of 7.57 mg/dL (H)).   Medical History: Past Medical History:  Diagnosis Date   Carotid artery occlusion 11/10/2010   LEFT CAROTID ENDARTERECTOMY   Complication of anesthesia    BP WENT UP AT [facility]    COPD (chronic obstructive pulmonary disease) (HCC)    pt denies this dx as of 06/01/20 - no inhaler    Diabetes mellitus without complication (HCC)    Diverticulitis    Diverticulosis of colon (without mention of hemorrhage)    DJD (degenerative joint disease)    knees/hands/feet/back/neck   ESRD (end stage renal disease) on dialysis (HCC)    Fatty liver    Full dentures    GERD (gastroesophageal reflux disease)    H/O hiatal hernia    History of blood transfusion    with a past surical procedure per patient 06/01/20   History of right below knee amputation (HCC) 07/12/2016   Hyperlipidemia    Hypertension    Neuromuscular disorder (HCC)    peripheral neuropathy   Non-pressure chronic ulcer of other part of left foot limited to breakdown of skin (HCC) 11/12/2016   Nonrheumatic aortic (valve) stenosis 03/18/2024   Limited TTE 03/18/2024: EF 55-60, moderate LVH, normal RVSF, moderate LAE, mild MR, mild-moderate AS (mean  12, V-max 235 cm/s, DI 0.35), aortic root 39 mm, moderate pleural effusion, trivial pericardial effusion    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 Atherectomy left popliteal artery July 2012.    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 60 tablet 1 10/05/2024 at  8:00 PM   atorvastatin  (LIPITOR ) 80 MG tablet Take 1 tablet (80 mg total) by mouth at bedtime. (Patient taking differently: Take 80 mg by mouth every evening.)   10/05/2024   carvedilol  (COREG ) 12.5 MG tablet Take 1 tablet (12.5 mg total) by mouth 2 (two) times daily with a meal.   10/05/2024   colchicine  0.6  MG tablet Take 0.5 tablets (0.3 mg total) by mouth daily. (Patient taking differently: Take 0.3 mg by mouth every evening.)   10/05/2024   CREAM BASE EX Apply 1 Application topically in the morning and at bedtime. Apply to incontinence area  PRN order: apply twice daily as needed for barrier cream   10/05/2024   ethyl chloride spray Apply 1 Application topically See admin instructions. Apply to vascular access in the morning on Tuesday, Thursday, and Saturday.   10/03/2024   famotidine  (PEPCID ) 20 MG tablet Take 20 mg by mouth at bedtime.   10/05/2024   fentaNYL  (DURAGESIC ) 12 MCG/HR Place 1 patch onto the skin every 3 (three) days. 5 patch 0 10/05/2024   ferrous sulfate  325 (65 FE) MG tablet Take 325 mg by mouth every evening.   10/05/2024   folic acid  (FOLVITE ) 1 MG tablet Take 1 mg by mouth every  evening.   10/05/2024   gabapentin  (NEURONTIN ) 100 MG capsule Take 100 mg by mouth 3 (three) times daily.   10/05/2024   insulin  aspart (NOVOLOG  FLEXPEN) 100 UNIT/ML FlexPen Inject 4-12 Units into the skin See admin instructions. Inject 4 units subcutaneously three times daily before meals, in ADDITION to 0-12 units per sliding scale: 70 - 150 : 0 units 151-200 : 2 units 201-250 : 4 units 251-300 : 6 units 301-350 : 8 units 351-400 : 10 units 401-450 : 12 units > 400 : give 12 units and recheck in 1 hour - if still > 400, call MD/NP for further orders.   10/05/2024   oxyCODONE  (OXY IR/ROXICODONE ) 5 MG immediate release tablet Take 1 tablet (5 mg total) by mouth every 6 (six) hours as needed for moderate pain (pain score 4-6) or severe pain (pain score 7-10). 30 tablet 0 10/05/2024   Phenylephrine  HCl (PREPARATION H) 0.25 % SUPP Place 1 suppository rectally every 6 (six) hours as needed (hemorrhoids).   Unknown   polyethylene glycol (MIRALAX  / GLYCOLAX ) 17 g packet Take 17 g by mouth daily as needed for moderate constipation.   Unknown   rOPINIRole  (REQUIP ) 2 MG tablet Take 2 mg by mouth at bedtime.   10/05/2024   senna (SENOKOT) 8.6 MG TABS tablet Take 17.2 mg by mouth every evening.   10/05/2024   sevelamer  carbonate (RENVELA ) 800 MG tablet Take 1 tablet (800 mg total) by mouth 3 (three) times daily with meals.   10/05/2024   tamsulosin  (FLOMAX ) 0.4 MG CAPS capsule Take 0.4 mg by mouth every evening.   10/05/2024   amoxicillin  (AMOXIL ) 500 MG capsule Take 500 mg by mouth 2 (two) times daily. (Patient not taking: Reported on 10/06/2024)   Not Taking   Scheduled:   atorvastatin   80 mg Per Tube Daily   Chlorhexidine  Gluconate Cloth  6 each Topical Daily   ipratropium-albuterol   3 mL Nebulization Q4H   lactulose   10 g Per Tube BID   mouth rinse  15 mL Mouth Rinse Q2H   pantoprazole  (PROTONIX ) IV  40 mg Intravenous Daily   Infusions:   dexmedetomidine  (PRECEDEX ) IV infusion 0.9 mcg/kg/hr (10/06/24  1700)   piperacillin -tazobactam (ZOSYN )  IV Stopped (10/06/24 1518)   [START ON 10/08/2024] vancomycin       Assessment: Patient with history of Afib on Eliquis  admitted from SNF with AMS and hypoxia. Last dose of Eliquis  1/26 8 pm. Noted to be ESRD. Pharmacy consulted to dose heparin . Will need to monitor aPTT and anti-Xa due to recent Eliquis .   Goal of Therapy:  Heparin   level 0.3-0.7 units/ml aPTT 66-102 Monitor platelets by anticoagulation protocol: Yes   Plan:  Start heparin  infusion at 1250 units/hr Check anti-Xa level and aPTT in 8 hours and daily while on heparin  Continue to monitor H&H and platelets  Rankin Sams 10/06/2024,5:08 PM      [1]  Allergies Allergen Reactions   Contrast Media [Iodinated Contrast Media] Shortness Of Breath and Other (See Comments)    Altered mental status     Ivp Dye [Iodinated Contrast Media] Anaphylaxis, Shortness Of Breath and Other (See Comments)    Altered mental status     Latex Rash    severe rash appears after the first 24 hours of being placed   Tape Rash and Other (See Comments)    Rash after 1 day of use  Do not leave on longer then 3 days without changing

## 2024-10-06 NOTE — Consult Note (Signed)
 NEUROLOGY CONSULT NOTE   Date of service: October 06, 2024 Patient Name: [name] MRN:  [id number] DOB:  10/13/1955 Chief Complaint: Altered mental status and myoclonus Requesting Provider: Sharie Bourbon, MD  History of Present Illness  [name] is a 69 y.o. male with hx of A-fib on Eliquis , end-stage renal disease on dialysis, diabetes, hypertension, aortic stenosis, chronic pain, CHF, left hemispheric TIAs, left CEA, carotid stent, cardiac stent and bilateral below-knee amputations who presents from his facility with a 2 to 3-day history of increased lethargy.  He was found to be hypotensive on admission with 2 fentanyl  patches in place.  Fentanyl  patches were removed, and Narcan  was administered with some improvement in responsiveness and blood pressure.  Myoclonic jerking of bilateral upper and lower extremities was appreciated by CCM after Narcan  administration and Neurology was consulted. Of note, the patient does take gabapentin  at his facility.  LKW: Unclear Modified rankin score: 4-Needs assistance to walk and tend to bodily needs IV Thrombolysis: No, outside of window EVT: No, exam not consistent with LVO  NIHSS components Score: Comment  1a Level of Conscious 0[]  1[]  2[x]  3[]      1b LOC Questions 0[]  1[]  2[x]       1c LOC Commands 0[]  1[]  2[x]       2 Best Gaze 0[x]  1[]  2[]       3 Visual 0[x]  1[]  2[]  3[]      4 Facial Palsy 0[]  1[x]  2[]  3[]      5a Motor Arm - left 0[]  1[]  2[x]  3[]  4[]  UN[]    5b Motor Arm - Right 0[]  1[]  2[x]  3[]  4[]  UN[]    6a Motor Leg - Left 0[]  1[]  2[]  3[x]  4[]  UN[]    6b Motor Leg - Right 0[]  1[]  2[]  3[x]  4[]  UN[]    7 Limb Ataxia 0[x]  1[]  2[]  UN[]      8 Sensory 0[]  1[x]  2[]  UN[]      9 Best Language 0[]  1[]  2[]  3[x]      10 Dysarthria 0[]  1[]  2[x]  UN[]      11 Extinct. and Inattention 0[x]  1[]  2[]       TOTAL:  23       ROS   Unable to ascertain due to altered mental status  Past History   Past Medical History:  Diagnosis Date    Carotid artery occlusion 11/10/2010   LEFT CAROTID ENDARTERECTOMY   Complication of anesthesia    BP WENT UP AT [facility]    COPD (chronic obstructive pulmonary disease) (HCC)    pt denies this dx as of 06/01/20 - no inhaler    Diabetes mellitus without complication (HCC)    Diverticulitis    Diverticulosis of colon (without mention of hemorrhage)    DJD (degenerative joint disease)    knees/hands/feet/back/neck   ESRD (end stage renal disease) on dialysis (HCC)    Fatty liver    Full dentures    GERD (gastroesophageal reflux disease)    H/O hiatal hernia    History of blood transfusion    with a past surical procedure per patient 06/01/20   History of right below knee amputation (HCC) 07/12/2016   Hyperlipidemia    Hypertension    Neuromuscular disorder (HCC)    peripheral neuropathy   Non-pressure chronic ulcer of other part of left foot limited to breakdown of skin (HCC) 11/12/2016   Nonrheumatic aortic (valve) stenosis 03/18/2024   Limited TTE 03/18/2024: EF 55-60, moderate LVH, normal RVSF, moderate LAE, mild MR, mild-moderate AS (mean 12, V-max 235 cm/s, DI 0.35),  aortic root 39 mm, moderate pleural effusion, trivial pericardial effusion    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 Atherectomy left popliteal artery July 2012.    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   Unstable angina (HCC) 09/16/2018   Wears glasses     Past Surgical History:  Procedure Laterality Date   ABDOMINAL AORTOGRAM W/LOWER EXTREMITY N/A 06/23/2021   Procedure: ABDOMINAL AORTOGRAM W/LOWER EXTREMITY;  Surgeon: Elmira Newman PARAS, MD;  Location: [facility];  Service: Cardiovascular;  Laterality: N/A;    AMPUTATION  11/05/2011   Procedure: AMPUTATION RAY;  Surgeon: Norleen Armor, MD;  Location: [facility] OR;  Service: Orthopedics;  Laterality: Right;  Amputation of Right 4&5th Toes   AMPUTATION Left 11/26/2012   Procedure: AMPUTATION RAY;  Surgeon: Norleen Armor, MD;  Location: [facility] OR;  Service: Orthopedics;  Laterality: Left;  fourth ray amputation   AMPUTATION Right 08/27/2014   Procedure: Transmetatarsal Amputation;  Surgeon: Jerona Harden GAILS, MD;  Location: [facility] OR;  Service: Orthopedics;  Laterality: Right;   AMPUTATION Right 01/14/2015   Procedure: AMPUTATION BELOW KNEE;  Surgeon: Jerona Harden GAILS, MD;  Location: [facility] OR;  Service: Orthopedics;  Laterality: Right;   AMPUTATION Left 10/21/2015   Procedure: Left Foot 5th Ray Amputation;  Surgeon: Jerona Harden GAILS, MD;  Location: [facility] OR;  Service: Orthopedics;  Laterality: Left;   AMPUTATION Left 07/04/2022   Procedure: LEFT BELOW KNEE AMPUTATION;  Surgeon: Harden Jerona GAILS, MD;  Location: [facility] OR;  Service: Orthopedics;  Laterality: Left;   AMPUTATION FINGER Left 09/23/2024   Procedure: AMPUTATION, FINGER;  Surgeon: Arlinda Buster, MD;  Location: [facility];  Service: Orthopedics;  Laterality: Left;  LEFT RING FINGER AMPUTATION   ANTERIOR FUSION CERVICAL SPINE  02/06/2006   C4-5, C5-6, C6-7; SURGEON DR. MAX COHEN   AV FISTULA PLACEMENT Left 06/02/2020   Procedure: ARTERIOVENOUS (AV) FISTULA CREATION LEFT;  Surgeon: Sheree Penne Bruckner, MD;  Location: [facility] OR;  Service: Vascular;  Laterality: Left;   BACK SURGERY     x 3   BASCILIC VEIN TRANSPOSITION Left 07/21/2020   Procedure: LEFT UPPER ARM ATERIOVENOUS SUPERFISTULALIZATION;  Surgeon: Sheree Penne Bruckner, MD;  Location: [facility] OR;  Service: Vascular;  Laterality: Left;   BELOW KNEE LEG AMPUTATION Right    CARDIAC CATHETERIZATION  10/31/2004   2009   CAROTID ENDARTERECTOMY  11/10/2010   CAROTID ENDARTERECTOMY Left 11/10/2010   Subtotal occlusion of left internal carotid artery with left hemispheric transient  ischemic attacks.   CAROTID STENT     CARPAL TUNNEL RELEASE Right 10/21/2013   Procedure: RIGHT CARPAL TUNNEL RELEASE;  Surgeon: Arley JONELLE Curia, MD;  Location: [facility];  Service: Orthopedics;  Laterality: Right;   CHOLECYSTECTOMY     COLON SURGERY     COLONOSCOPY     COLOSTOMY REVERSAL  05/21/2018   ileostomy reversal   CORONARY LITHOTRIPSY N/A 11/24/2023   Procedure: CORONARY LITHOTRIPSY;  Surgeon: Jordan, Peter M, MD;  Location: [facility] INVASIVE CV LAB;  Service: Cardiovascular;  Laterality: N/A;   CORONARY STENT INTERVENTION N/A 11/24/2023   Procedure: CORONARY STENT INTERVENTION;  Surgeon: Jordan, Peter M, MD;  [facility];  Service: Cardiovascular;  Laterality: N/A;   CYSTOSCOPY WITH STENT PLACEMENT Bilateral 01/13/2018   Procedure: CYSTOSCOPY WITH BILATERAL URETERAL CATHETER PLACEMENT;  Surgeon: Cam Morene ORN, MD;  Location: [facility] ORS;  Service: Urology;  Laterality: Bilateral;   ESOPHAGEAL MANOMETRY Bilateral 07/19/2014   Procedure: ESOPHAGEAL MANOMETRY (EM);  Surgeon: Gordy CHRISTELLA Starch, MD;  Location: [facility];  Service: Gastroenterology;  Laterality: Bilateral;   EYE SURGERY Bilateral 2020   cataract   FEMORAL ARTERY STENT     x6   FINGER SURGERY     FOOT SURGERY  04/25/2016    EXCISION BASE 5TH METATARSAL AND PARTIAL CUBOID LEFT FOOT   HERNIA REPAIR     LEFT INGUINAL AND UMBILICAL REPAIRS   HERNIA REPAIR     I & D EXTREMITY Left 04/25/2016   Procedure: EXCISION BASE 5TH METATARSAL AND PARTIAL CUBOID LEFT FOOT;  Surgeon: Jerona LULLA Sage, MD;  Location: [facility];  Service: Orthopedics;  Laterality: Left;   ILEOSTOMY  01/13/2018   Procedure: ILEOSTOMY;  Surgeon: Signe Mitzie LABOR, MD;  Location: [facility]S;  Service: General;;   ILEOSTOMY CLOSURE N/A 05/21/2018   Procedure: ILEOSTOMY REVERSAL ERAS PATHWAY;  Surgeon: Signe Mitzie LABOR, MD;  Location: [facility];  Service: General;  Laterality: N/A;   IR RADIOLOGIST EVAL & MGMT  11/19/2017   IR RADIOLOGIST EVAL  & MGMT  12/03/2017   IR RADIOLOGIST EVAL & MGMT  12/18/2017   JOINT REPLACEMENT Right 2001   Total knee   LAMINECTOMY     X 3 LUMBAR AND X 2 CERVICAL SPINE OPERATIONS   LAPAROSCOPIC CHOLECYSTECTOMY W/ CHOLANGIOGRAPHY  11/09/2004   SURGEON DR. DEWARD CANDIE NULL   LEFT HEART CATH AND CORONARY ANGIOGRAPHY N/A 09/16/2018   Procedure: LEFT HEART CATH AND CORONARY ANGIOGRAPHY;  Surgeon: Elmira Newman PARAS, MD;  [facility];  Service: Cardiovascular;  Laterality: N/A;   LEFT HEART CATH AND CORONARY ANGIOGRAPHY N/A 11/24/2023   Procedure: LEFT HEART CATH AND CORONARY ANGIOGRAPHY;  Surgeon: Jordan, Peter M, MD;  Location: [facility] INVASIVE CV LAB;  Service: Cardiovascular;  Laterality: N/A;   LEFT HEART CATHETERIZATION WITH CORONARY ANGIOGRAM N/A 10/29/2014   Procedure: LEFT HEART CATHETERIZATION WITH CORONARY ANGIOGRAM;  Surgeon: Erick JONELLE Bergamo, MD;  Location: [facility] CATH LAB;  Service: Cardiovascular;  Laterality: N/A;   LIGATION OF COMPETING BRANCHES OF ARTERIOVENOUS FISTULA Left 07/21/2020   Procedure: LIGATION OF COMPETING BRANCHES OF LEFT UPPER ARM ARTERIOVENOUS FISTULA;  Surgeon: Sheree Penne Bruckner, MD;  Location: [facility] OR;  Service: Vascular;  Laterality: Left;   LOWER EXTREMITY ANGIOGRAM N/A 03/19/2012   Procedure: LOWER EXTREMITY ANGIOGRAM;  Surgeon: Bruckner JONETTA Cash, MD;  Location: [facility] CATH LAB;  Service: Cardiovascular;  Laterality: N/A;   LOWER EXTREMITY ANGIOGRAPHY N/A 06/20/2021   Procedure: LOWER EXTREMITY ANGIOGRAPHY;  Surgeon: Elmira Newman PARAS, MD;  [facility];  Service: Cardiovascular;  Laterality: N/A;   NECK SURGERY     PARTIAL COLECTOMY N/A 01/13/2018   Procedure: LAPAROSCOPIC ASSISTED   SIGMOID COLECTOMY ILEOSTOMY;  Surgeon: Signe Mitzie LABOR, MD;  Location: [facility]S;  Service: General;  Laterality: N/A;   PENILE PROSTHESIS IMPLANT  08/14/2005   INFRAPUBIC INSERTION OF INFLATABLE PENILE PROSTHESIS; SURGEON DR. JANIT   PENILE PROSTHESIS  IMPLANT     PERCUTANEOUS CORONARY STENT INTERVENTION (PCI-S) Right 10/29/2014   Procedure: PERCUTANEOUS CORONARY STENT INTERVENTION (PCI-S);  Surgeon: Erick JONELLE Bergamo, MD;  Location: [facility] CATH LAB;  Service: Cardiovascular;  Laterality: Right;  PERICARDIOCENTESIS N/A 02/13/2024   Procedure: PERICARDIOCENTESIS;  Surgeon: Elmira Newman PARAS, MD;  [facility];  Service: Cardiovascular;  Laterality: N/A;   PERIPHERAL VASCULAR INTERVENTION Left 06/23/2021   Procedure: PERIPHERAL VASCULAR INTERVENTION;  Surgeon: Elmira Newman PARAS, MD;  [facility];  Service: Cardiovascular;  Laterality: Left;   REMOVAL OF PENILE PROSTHESIS N/A 06/14/2021   Procedure: Removal of THREE piece inflatable penile prosthesis;  Surgeon: Carolee Sherwood JONETTA DOUGLAS, MD;  Location: [facility] OR;  Service: Urology;  Laterality: N/A;   SHOULDER ARTHROSCOPY     SPINE SURGERY     TEMPORARY PACEMAKER N/A 02/13/2024   Procedure: TEMPORARY PACEMAKER;  Surgeon: Elmira Newman PARAS, MD;  Location: [facility];  Service: Cardiovascular;  Laterality: N/A;   TOE AMPUTATION Left    TONSILLECTOMY     TOTAL KNEE ARTHROPLASTY  07/2002   RIGHT KNEE ; SURGEON  DR. [name] ALSO HAD ARTHROSCOPIC RIGHT KNEE IN  10/2001   TOTAL KNEE ARTHROPLASTY     ULNAR NERVE TRANSPOSITION Right 10/21/2013   Procedure: RIGHT ELBOW  ULNAR NERVE DECOMPRESSION;  Surgeon: Arley JONELLE Curia, MD;  Location: [facility];  Service: Orthopedics;  Laterality: Right;    Family History: Family History  Problem Relation Age of Onset   Heart disease Father        Before age 26-  CAD, BPG   Diabetes Father        Amputation   Cancer Father        PROSTATE   Hyperlipidemia Father    Hypertension Father    Heart attack Father        Triple BPG   Varicose Veins Father    Cancer Sister        Breast   Hyperlipidemia Sister    Hypertension Sister    Heart attack Brother    Colon cancer Brother    Diabetes Brother    Heart  disease Brother 16       A-Fib. Before age 83   Hyperlipidemia Brother    Hypertension Brother    Hypertension Son    Arthritis Other        GRANDMOTHER   Hypertension Other        OTHER FAMILY MEMBERS    Social History  reports that he has been smoking cigarettes. He has a 35 pack-year smoking history. He has never used smokeless tobacco. He reports that he does not currently use alcohol . He reports that he does not use drugs.  Allergies[1]  Medications  Current Medications[2]  Vitals   Vitals:   10/06/24 0915 10/06/24 0930 10/06/24 1226 10/06/24 1229  BP: (!) 82/43 (!) 94/41 (!) 147/118 115/81  Pulse: 74 74 81   Resp: 13 (!) 23    Temp:      TempSrc:      SpO2: 100% 100% 100%     There is no height or weight on file to calculate BMI.   Physical Exam   Constitutional: Chronically ill-appearing elderly patient in no acute distress Eyes: No scleral injection.  HENT: No OP obstruction.  Head: Normocephalic.  Cardiovascular: A-fib on the monitor Respiratory: Somewhat labored respirations on supplemental O2 Skin: Gangrene to fourth finger of left hand  Neurologic Examination    Mental Status: Patient rests with eyes closed, does not open eyes to voice or touch, does not follow commands or respond to name Speech/Language: No verbal output, some moaning and grimacing to noxious stimuli Cranial Nerves:  II: PERRL.  III, IV, VI: Oculocephalic reflex intact VII: Questionable left facial droop XII: Noncooperative with tongue protrusion Motor: Moves bilateral upper extremities spontaneously and not purposefully with antigravity strength, moves bilateral lower extremities but does not lift them off the bed, myoclonic jerking and asterixis-like movements noted in all 4 extremities intermittently; the movements are chaotic and asynchronous Tone: is normal and bulk is normal Sensation-will grimace to noxious stimuli in all 4 extremities Coordination/Gait: Unable to  assess   Labs/Imaging/Neurodiagnostic studies   CBC:  Recent Labs  Lab 2024-10-08 0809 10-08-24 0845 10/08/24 1215  WBC 9.5  --   --   NEUTROABS 7.4  --   --   HGB 10.1* 10.5* 11.6*  HCT 33.0* 31.0* 34.0*  MCV 95.9  --   --   PLT 164  --   --    Basic Metabolic Panel:  Lab Results  Component Value Date   NA 142 10/08/2024   K 4.8 Oct 08, 2024   CO2 25 10/08/24   GLUCOSE 160 (H) 10-08-24   BUN 54 (H) Oct 08, 2024   CREATININE 8.40 (H) 10/08/24   CALCIUM  8.7 (L) 10/08/2024   GFRNONAA 7 (L) Oct 08, 2024   GFRAA 16 (L) 05/30/2020   Lipid Panel:  Lab Results  Component Value Date   LDLCALC 42 08/27/2024   HgbA1c:  Lab Results  Component Value Date   HGBA1C 6.9 (H) 08/28/2024   Urine Drug Screen:     Component Value Date/Time   LABOPIA NEGATIVE 08/26/2024 1303   COCAINSCRNUR NEGATIVE 08/26/2024 1303   LABBENZ NEGATIVE 08/26/2024 1303   AMPHETMU NEGATIVE 08/26/2024 1303   THCU NEGATIVE 08/26/2024 1303   LABBARB NEGATIVE 08/26/2024 1303    Alcohol  Level     Component Value Date/Time   ETH <10 05/28/2023 1243   INR  Lab Results  Component Value Date   INR 2.9 (H) Oct 08, 2024   APTT  Lab Results  Component Value Date   APTT 47 (H) 09/22/2024    CT Head without contrast (Personally reviewed): No acute abnormality  CT angio Head and Neck with contrast: Unable to be performed due to contrast allergy  MR Angio head and neck Pending  MRI Brain: Pending  Neurodiagnostics Continuous EEG:  Pending  ASSESSMENT  [name] is a 69 y.o. male with hx of A-fib on Eliquis , end-stage renal disease on dialysis, diabetes, hypertension, aortic stenosis, chronic pain, CHF, left hemispheric TIAs, left CEA, carotid stent, cardiac stent and bilateral below-knee amputations who presents from his facility with a 2 to 3-day history of increased lethargy.  He was found to be hypotensive on admission with 2 fentanyl  patches in place.  Fentanyl  patches were removed, and  Narcan  was administered with some improvement in responsiveness and blood pressure.  Myoclonic jerking of bilateral upper and lower extremities was appreciated by CCM after Narcan  administration and Neurology was consulted. Of note, the patient does take gabapentin  at his facility. - On exam, he is obtunded but becomes agitated with flailing limb movements in response to external stimuli. He is unable to follow commands but moves all 4 extremities non-purposefully with myoclonic jerking and asterixis-like movements.   - Initial CT head revealed no acute abnormalities.  - STAT MRI/MRA has been ordered to investigate for stroke.   - CTA head and neck is unable to be done due to contrast allergy.   - Given myoclonic jerking, will connect patient to LTM EEG, but suspect that his movements may be due to gabapentin  toxicity in the  setting of his ESRD  RECOMMENDATIONS  - MRI brain and MRA of head and neck - LTM EEG - Neurology will continue to follow  Addendum: MRI HEAD:  1. No acute intracranial abnormality. 2. 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 3. Underlying age-related cerebral atrophy with mild chronic small vessel ischemic disease.  MRA HEAD:  1. 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 Mild retrograde filling of the distal left V4 segment via the vertebrobasilar junction. Left PICA not well seen on this exam. 2. Otherwise negative intracranial MRA. No other large vessel occlusion or hemodynamically significant stenosis.   MRA NECK:  1. No acute abnormality about the major arterial vasculature of the neck. 2. Atheromatous irregularity about the carotid bifurcations without hemodynamically significant greater than 50% stenosis. 3. Both vertebral  arteries patent within the neck. Right vertebral artery dominant. ______________________________________________________________________  Patient seen by NP with MD. Signed, Cortney E Everitt Clint Kill, NP [facility]   I have seen and examined the patient. I have formulated the assessment and recommendations. 69 year old male presenting with a 2-3 day history of increasing lethargy. Initially responded to Narcan , but then was noted to be exhibiting myoclonic and asterixis-like movements. On exam, he is obtunded but becomes agitated with flailing limb movements in response to external stimuli. He is unable to follow commands but moves all 4 extremities non-purposefully with myoclonic jerking and asterixis-like movements. CT head with no acute abnormalities. Suspect Neurontin  toxicity. Recommendations as above.  Electronically signed: Dr. Camellia Shark     [1]  Allergies Allergen Reactions   Contrast Media [Iodinated Contrast Media] Shortness Of Breath and Other (See Comments)    Altered mental status     Ivp Dye [Iodinated Contrast Media] Anaphylaxis, Shortness Of Breath and Other (See Comments)    Altered mental status     Latex Rash    severe rash appears after the first 24 hours of being placed   Tape Rash and Other (See Comments)    Rash after 1 day of use  Do not leave on longer then 3 days without changing  [2]  Current Facility-Administered Medications:    Chlorhexidine  Gluconate Cloth 2 % PADS 6 each, 6 each, Topical, Daily, Antonetta Moccasin B, NP   dexmedetomidine  (PRECEDEX ) 400 MCG/100ML (4 mcg/mL) infusion, 0-1.2 mcg/kg/hr, Intravenous, Continuous, Antonetta Moccasin B, NP   etomidate  (AMIDATE ) injection, , Intravenous, Code/Trauma/Sedation Med, [place], [place], MD, 20 mg at 10/06/24 1227   fentaNYL  (SUBLIMAZE ) injection 50 mcg, 50 mcg, Intravenous, Q15 min PRN, Antonetta Moccasin B, NP   fentaNYL  (SUBLIMAZE ) injection 50-200 mcg, 50-200 mcg, Intravenous, Q30 min PRN,  Antonetta Moccasin B, NP   naloxone  (NARCAN ) injection 0.4 mg, 0.4 mg, Intravenous, Once, Sharie Bourbon, MD   Oral care mouth rinse, 15 mL, Mouth Rinse, Q2H, Simpson, Paula B, NP   Oral care mouth rinse, 15 mL, Mouth Rinse, PRN, Antonetta Moccasin B, NP   pantoprazole  (PROTONIX ) injection 40 mg, 40 mg, Intravenous, Daily, Antonetta Moccasin B, NP   rocuronium  (ZEMURON ) injection, , Intravenous, Code/Trauma/Sedation Med, [place], [place], MD, 100 mg at 10/06/24 1227  Current Outpatient Medications:    apixaban  (ELIQUIS ) 5 MG TABS tablet, Take 1 tablet (5 mg total) by mouth 2 (two) times daily., Disp: 60 tablet, Rfl: 1   atorvastatin  (LIPITOR ) 80 MG tablet,  Take 1 tablet (80 mg total) by mouth at bedtime. (Patient taking differently: Take 80 mg by mouth every evening.), Disp: , Rfl:    carvedilol  (COREG ) 12.5 MG tablet, Take 1 tablet (12.5 mg total) by mouth 2 (two) times daily with a meal., Disp: , Rfl:    colchicine  0.6 MG tablet, Take 0.5 tablets (0.3 mg total) by mouth daily. (Patient taking differently: Take 0.3 mg by mouth every evening.), Disp: , Rfl:    CREAM BASE EX, Apply 1 Application topically in the morning and at bedtime. Apply to incontinence area  PRN order: apply twice daily as needed for barrier cream, Disp: , Rfl:    ethyl chloride spray, Apply 1 Application topically See admin instructions. Apply to vascular access in the morning on Tuesday, Thursday, and Saturday., Disp: , Rfl:    famotidine  (PEPCID ) 20 MG tablet, Take 20 mg by mouth at bedtime., Disp: , Rfl:    fentaNYL  (DURAGESIC ) 12 MCG/HR, Place 1 patch onto the skin every 3 (three) days., Disp: 5 patch, Rfl: 0   ferrous sulfate  325 (65 FE) MG tablet, Take 325 mg by mouth every evening., Disp: , Rfl:    folic acid  (FOLVITE ) 1 MG tablet, Take 1 mg by mouth every evening., Disp: , Rfl:    gabapentin  (NEURONTIN ) 100 MG capsule, Take 100 mg by mouth 3 (three) times daily., Disp: , Rfl:    insulin  aspart (NOVOLOG  FLEXPEN) 100 UNIT/ML  FlexPen, Inject 4-12 Units into the skin See admin instructions. Inject 4 units subcutaneously three times daily before meals, in ADDITION to 0-12 units per sliding scale: 70 - 150 : 0 units 151-200 : 2 units 201-250 : 4 units 251-300 : 6 units 301-350 : 8 units 351-400 : 10 units 401-450 : 12 units > 400 : give 12 units and recheck in 1 hour - if still > 400, call MD/NP for further orders., Disp: , Rfl:    oxyCODONE  (OXY IR/ROXICODONE ) 5 MG immediate release tablet, Take 1 tablet (5 mg total) by mouth every 6 (six) hours as needed for moderate pain (pain score 4-6) or severe pain (pain score 7-10)., Disp: 30 tablet, Rfl: 0   Phenylephrine  HCl (PREPARATION H) 0.25 % SUPP, Place 1 suppository rectally every 6 (six) hours as needed (hemorrhoids)., Disp: , Rfl:    polyethylene glycol (MIRALAX  / GLYCOLAX ) 17 g packet, Take 17 g by mouth daily as needed for moderate constipation., Disp: , Rfl:    rOPINIRole  (REQUIP ) 2 MG tablet, Take 2 mg by mouth at bedtime., Disp: , Rfl:    senna (SENOKOT) 8.6 MG TABS tablet, Take 17.2 mg by mouth every evening., Disp: , Rfl:    sevelamer  carbonate (RENVELA ) 800 MG tablet, Take 1 tablet (800 mg total) by mouth 3 (three) times daily with meals., Disp: , Rfl:    tamsulosin  (FLOMAX ) 0.4 MG CAPS capsule, Take 0.4 mg by mouth every evening., Disp: , Rfl:    amoxicillin  (AMOXIL ) 500 MG capsule, Take 500 mg by mouth 2 (two) times daily. (Patient not taking: Reported on 10/06/2024), Disp: , Rfl:

## 2024-10-06 NOTE — Progress Notes (Signed)
 Pt transported from 2M06 to MRI via ventilator and back with no complications

## 2024-10-06 NOTE — Consult Note (Signed)
 [facility] Renal Consultation Note    Indication for Consultation:  Management of ESRD/hemodialysis, anemia, hypertension/volume, and secondary hyperparathyroidism.  HPI: [name] is a 69 y.o. male with PMH inclduing ESRD on dialysis, DM, BKA, HTN, HLD, OSA. Presented to the ED with confusion and lethargy for the past 3 days. Upon arrival to the ED, systolic BP in the 60's, O2 80% on RA. He reportedly had two fentanyl  patches on him which were removed narcan  was administered. He was given 1L of LR with some improvement in mentation. Labs notable for K 151 BUN 54 Cr 8.4 WBC 9.5 Hgb 10.5. CXR concerning for pneumonia. Sepsis protocol was initiated and we was given vanc and cefepime . Of note, he was recently discharge from [facility] with metabolic encephalopathy and sepsis 2/2 pneumonia, was discharged on amoxicillin . Nephrology was consulted for management of ESRD. His last dialysis was 10/03/24 for 1hr , otherwise has been compliant with HD. Pt seen in the ED. He is orally intubated and on precedex  so unable to contribute to ROS.   Past Medical History:  Diagnosis Date   Carotid artery occlusion 11/10/2010   LEFT CAROTID ENDARTERECTOMY   Complication of anesthesia    BP WENT UP AT [facility]    COPD (chronic obstructive pulmonary disease) (HCC)    pt denies this dx as of 06/01/20 - no inhaler    Diabetes mellitus without complication (HCC)    Diverticulitis    Diverticulosis of colon (without mention of hemorrhage)    DJD (degenerative joint disease)    knees/hands/feet/back/neck   ESRD (end stage renal disease) on dialysis (HCC)    Fatty liver    Full dentures    GERD (gastroesophageal reflux disease)    H/O hiatal hernia    History of blood transfusion    with a past surical procedure per patient 06/01/20   History of right below knee amputation (HCC) 07/12/2016   Hyperlipidemia    Hypertension    Neuromuscular disorder (HCC)    peripheral neuropathy   Non-pressure  chronic ulcer of other part of left foot limited to breakdown of skin (HCC) 11/12/2016   Nonrheumatic aortic (valve) stenosis 03/18/2024   Limited TTE 03/18/2024: EF 55-60, moderate LVH, normal RVSF, moderate LAE, mild MR, mild-moderate AS (mean 12, V-max 235 cm/s, DI 0.35), aortic root 39 mm, moderate pleural effusion, trivial pericardial effusion    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 Atherectomy left popliteal artery July 2012.    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   Unstable angina (HCC) 09/16/2018   Wears glasses    Past Surgical History:  Procedure Laterality Date   ABDOMINAL AORTOGRAM W/LOWER EXTREMITY N/A 06/23/2021   Procedure: ABDOMINAL AORTOGRAM W/LOWER EXTREMITY;  Surgeon: Elmira Newman PARAS, MD;  [facility];  Service: Cardiovascular;  Laterality: N/A;   AMPUTATION  11/05/2011   Procedure: AMPUTATION RAY;  Surgeon: Norleen Armor, MD;  Location: [facility] OR;  Service: Orthopedics;  Laterality: Right;  Amputation of Right 4&5th Toes   AMPUTATION Left 11/26/2012   Procedure: AMPUTATION RAY;  Surgeon: Norleen Armor, MD;  Location: [facility] OR;  Service: Orthopedics;  Laterality:  Left;  fourth ray amputation   AMPUTATION Right 08/27/2014   Procedure: Transmetatarsal Amputation;  Surgeon: Jerona Harden GAILS, MD;  Location: [facility] OR;  Service: Orthopedics;  Laterality: Right;   AMPUTATION Right 01/14/2015   Procedure: AMPUTATION BELOW KNEE;  Surgeon: Jerona Harden GAILS, MD;  Location: [facility] OR;  Service: Orthopedics;  Laterality: Right;   AMPUTATION Left 10/21/2015   Procedure: Left Foot 5th Ray Amputation;  Surgeon: Jerona Harden GAILS, MD;  Location: [facility] OR;  Service: Orthopedics;  Laterality: Left;    AMPUTATION Left 07/04/2022   Procedure: LEFT BELOW KNEE AMPUTATION;  Surgeon: Harden Jerona GAILS, MD;  Location: [facility] OR;  Service: Orthopedics;  Laterality: Left;   AMPUTATION FINGER Left 09/23/2024   Procedure: AMPUTATION, FINGER;  Surgeon: Arlinda Buster, MD;  [facility] OR;  Service: Orthopedics;  Laterality: Left;  LEFT RING FINGER AMPUTATION   ANTERIOR FUSION CERVICAL SPINE  02/06/2006   C4-5, C5-6, C6-7; SURGEON DR. MAX COHEN   AV FISTULA PLACEMENT Left 06/02/2020   Procedure: ARTERIOVENOUS (AV) FISTULA CREATION LEFT;  Surgeon: Sheree Penne Bruckner, MD;  Location: [facility] OR;  Service: Vascular;  Laterality: Left;   BACK SURGERY     x 3   BASCILIC VEIN TRANSPOSITION Left 07/21/2020   Procedure: LEFT UPPER ARM ATERIOVENOUS SUPERFISTULALIZATION;  Surgeon: Sheree Penne Bruckner, MD;  Location: [facility] OR;  Service: Vascular;  Laterality: Left;   BELOW KNEE LEG AMPUTATION Right    CARDIAC CATHETERIZATION  10/31/2004   2009   CAROTID ENDARTERECTOMY  11/10/2010   CAROTID ENDARTERECTOMY Left 11/10/2010   Subtotal occlusion of left internal carotid artery with left hemispheric transient ischemic attacks.   CAROTID STENT     CARPAL TUNNEL RELEASE Right 10/21/2013   Procedure: RIGHT CARPAL TUNNEL RELEASE;  Surgeon: Arley JONELLE Curia, MD;  Location: [facility];  Service: Orthopedics;  Laterality: Right;   CHOLECYSTECTOMY     COLON SURGERY     COLONOSCOPY     COLOSTOMY REVERSAL  05/21/2018   ileostomy reversal   CORONARY LITHOTRIPSY N/A 11/24/2023   Procedure: CORONARY LITHOTRIPSY;  Surgeon: Jordan, Peter M, MD;  Location: [facility] INVASIVE CV LAB;  Service: Cardiovascular;  Laterality: N/A;   CORONARY STENT INTERVENTION N/A 11/24/2023   Procedure: CORONARY STENT INTERVENTION;  Surgeon: Jordan, Peter M, MD;  Location: [facility] INVASIVE CV LAB;  Service: Cardiovascular;  Laterality: N/A;   CYSTOSCOPY WITH STENT PLACEMENT Bilateral 01/13/2018   Procedure: CYSTOSCOPY WITH BILATERAL URETERAL  CATHETER PLACEMENT;  Surgeon: Cam Morene ORN, MD;  Location: [facility] ORS;  Service: Urology;  Laterality: Bilateral;   ESOPHAGEAL MANOMETRY Bilateral 07/19/2014   Procedure: ESOPHAGEAL MANOMETRY (EM);  Surgeon: Gordy CHRISTELLA Starch, MD;  Location: [facility];  Service: Gastroenterology;  Laterality: Bilateral;   EYE SURGERY Bilateral 2020   cataract   FEMORAL ARTERY STENT     x6   FINGER SURGERY     FOOT SURGERY  04/25/2016    EXCISION BASE 5TH METATARSAL AND PARTIAL CUBOID LEFT FOOT   HERNIA REPAIR     LEFT INGUINAL AND UMBILICAL REPAIRS   HERNIA REPAIR     I & D EXTREMITY Left 04/25/2016   Procedure: EXCISION BASE 5TH METATARSAL AND PARTIAL CUBOID LEFT FOOT;  Surgeon: Jerona GAILS Harden, MD;  [facility] OR;  Service: Orthopedics;  Laterality: Left;   ILEOSTOMY  01/13/2018   Procedure: ILEOSTOMY;  Surgeon: Signe Mitzie LABOR, MD;  Location: [facility]S;  Service: General;;   ILEOSTOMY CLOSURE N/A 05/21/2018   Procedure: ILEOSTOMY REVERSAL ERAS PATHWAY;  Surgeon: Signe Mitzie LABOR, MD;  Location: [facility] OR;  Service: General;  Laterality: N/A;   IR RADIOLOGIST EVAL & MGMT  11/19/2017   IR RADIOLOGIST EVAL & MGMT  12/03/2017   IR RADIOLOGIST EVAL & MGMT  12/18/2017   JOINT REPLACEMENT Right 2001   Total knee   LAMINECTOMY     X 3 LUMBAR AND X 2 CERVICAL SPINE OPERATIONS   LAPAROSCOPIC CHOLECYSTECTOMY W/ CHOLANGIOGRAPHY  11/09/2004   SURGEON DR. DEWARD CANDIE NULL   LEFT HEART CATH AND CORONARY ANGIOGRAPHY N/A 09/16/2018   Procedure: LEFT HEART CATH AND CORONARY ANGIOGRAPHY;  Surgeon: Elmira Newman PARAS, MD;  [facility];  Service: Cardiovascular;  Laterality: N/A;   LEFT HEART CATH AND CORONARY ANGIOGRAPHY N/A 11/24/2023   Procedure: LEFT HEART CATH AND CORONARY ANGIOGRAPHY;  Surgeon: Jordan, Peter M, MD;  Location: [facility] INVASIVE CV LAB;  Service: Cardiovascular;  Laterality: N/A;   LEFT HEART CATHETERIZATION WITH CORONARY ANGIOGRAM N/A 10/29/2014   Procedure: LEFT HEART CATHETERIZATION WITH  CORONARY ANGIOGRAM;  Surgeon: Erick JONELLE Bergamo, MD;  Location: [facility] CATH LAB;  Service: Cardiovascular;  Laterality: N/A;   LIGATION OF COMPETING BRANCHES OF ARTERIOVENOUS FISTULA Left 07/21/2020   Procedure: LIGATION OF COMPETING BRANCHES OF LEFT UPPER ARM ARTERIOVENOUS FISTULA;  Surgeon: Sheree Penne Bruckner, MD;  Location: [facility] OR;  Service: Vascular;  Laterality: Left;   LOWER EXTREMITY ANGIOGRAM N/A 03/19/2012   Procedure: LOWER EXTREMITY ANGIOGRAM;  Surgeon: Bruckner JONETTA Cash, MD;  Location: [facility] CATH LAB;  Service: Cardiovascular;  Laterality: N/A;   LOWER EXTREMITY ANGIOGRAPHY N/A 06/20/2021   Procedure: LOWER EXTREMITY ANGIOGRAPHY;  Surgeon: Elmira Newman PARAS, MD;  [facility];  Service: Cardiovascular;  Laterality: N/A;   NECK SURGERY     PARTIAL COLECTOMY N/A 01/13/2018   Procedure: LAPAROSCOPIC ASSISTED   SIGMOID COLECTOMY ILEOSTOMY;  Surgeon: Signe Mitzie LABOR, MD;  Location: [facility]S;  Service: General;  Laterality: N/A;   PENILE PROSTHESIS IMPLANT  08/14/2005   INFRAPUBIC INSERTION OF INFLATABLE PENILE PROSTHESIS; SURGEON DR. JANIT   PENILE PROSTHESIS IMPLANT     PERCUTANEOUS CORONARY STENT INTERVENTION (PCI-S) Right 10/29/2014   Procedure: PERCUTANEOUS CORONARY STENT INTERVENTION (PCI-S);  Surgeon: Erick JONELLE Bergamo, MD;  Location: [facility] CATH LAB;  Service: Cardiovascular;  Laterality: Right;   PERICARDIOCENTESIS N/A 02/13/2024   Procedure: PERICARDIOCENTESIS;  Surgeon: Elmira Newman PARAS, MD;  [facility];  Service: Cardiovascular;  Laterality: N/A;   PERIPHERAL VASCULAR INTERVENTION Left 06/23/2021   Procedure: PERIPHERAL VASCULAR INTERVENTION;  Surgeon: Elmira Newman PARAS, MD;  [facility];  Service: Cardiovascular;  Laterality: Left;   REMOVAL OF PENILE PROSTHESIS N/A 06/14/2021   Procedure: Removal of THREE piece inflatable penile prosthesis;  Surgeon: Carolee Sherwood JONETTA DOUGLAS, MD;  Location: [facility] OR;  Service: Urology;   Laterality: N/A;   SHOULDER ARTHROSCOPY     SPINE SURGERY     TEMPORARY PACEMAKER N/A 02/13/2024   Procedure: TEMPORARY PACEMAKER;  Surgeon: Elmira Newman PARAS, MD;  [facility];  Service: Cardiovascular;  Laterality: N/A;   TOE AMPUTATION Left    TONSILLECTOMY     TOTAL KNEE ARTHROPLASTY  07/2002   RIGHT KNEE ; SURGEON  DR. [name] ALSO HAD ARTHROSCOPIC RIGHT KNEE IN  10/2001   TOTAL KNEE ARTHROPLASTY     ULNAR NERVE TRANSPOSITION Right 10/21/2013   Procedure: RIGHT ELBOW  ULNAR NERVE DECOMPRESSION;  Surgeon: Arley JONELLE Curia, MD;  Location: [facility];  Service: Orthopedics;  Laterality: Right;   Family History  Problem Relation Age of Onset   Heart disease Father        Before age 58-  CAD, BPG   Diabetes Father        Amputation   Cancer Father        PROSTATE   Hyperlipidemia Father    Hypertension Father    Heart attack Father        Triple BPG   Varicose Veins Father    Cancer Sister        Breast   Hyperlipidemia Sister    Hypertension Sister    Heart attack Brother    Colon cancer Brother    Diabetes Brother    Heart disease Brother 65       A-Fib. Before age 75   Hyperlipidemia Brother    Hypertension Brother    Hypertension Son    Arthritis Other        GRANDMOTHER   Hypertension Other        OTHER FAMILY MEMBERS   Social History:  reports that he has been smoking cigarettes. He has a 35 pack-year smoking history. He has never used smokeless tobacco. He reports that he does not currently use alcohol . He reports that he does not use drugs.  ROS: As per HPI otherwise negative.  Physical Exam: Vitals:   10/06/24 0930 10/06/24 1226 10/06/24 1229 10/06/24 1239  BP: (!) 94/41 (!) 147/118 115/81   Pulse: 74 81    Resp: (!) 23     Temp:      TempSrc:      SpO2: 100% 100%  100%     General: intubated, sedated, ill appearing male  Head: Normocephalic, atraumatic, sclera non-icteric, mucus membranes are moist. Neck: JVD not  elevated. Lungs: Clear bilaterally to auscultation anteriorly Heart: RRR with normal S1, S2. No murmurs, rubs, or gallops appreciated. Abdomen: Soft, non-distended with normoactive bowel sounds.  Musculoskeletal:  Strength and tone appear normal for age. Lower extremities: b/l BKAs, no edema noted Neuro: sedating, not responding to voice Dialysis Access: AVF + T/b  Allergies[1] Prior to Admission medications  Medication Sig Start Date End Date Taking? Authorizing Provider  apixaban  (ELIQUIS ) 5 MG TABS tablet Take 1 tablet (5 mg total) by mouth 2 (two) times daily. 01/14/22 05/10/25 Yes Gonfa, Taye T, MD  atorvastatin  (LIPITOR ) 80 MG tablet Take 1 tablet (80 mg total) by mouth at bedtime. Patient taking differently: Take 80 mg by mouth every evening. 11/25/23  Yes Amoako, Prince, MD  carvedilol  (COREG ) 12.5 MG tablet Take 1 tablet (12.5 mg total) by mouth 2 (two) times daily with a meal. 09/25/24  Yes Adhikari, Ivonne, MD  colchicine  0.6 MG tablet Take 0.5 tablets (0.3 mg total) by mouth daily. Patient taking differently: Take 0.3 mg by mouth every evening. 02/21/24  Yes Kathrin Mignon DASEN, MD  CREAM BASE EX Apply 1 Application topically in the morning and at bedtime. Apply to incontinence area  PRN order: apply twice daily as needed for barrier cream   Yes [provider]  ethyl chloride spray Apply 1 Application topically See admin instructions. Apply to vascular access in the morning on Tuesday, Thursday, and Saturday. 09/14/24  Yes [provider]  famotidine  (PEPCID ) 20 MG tablet Take 20 mg by mouth at bedtime.   Yes [provider]  fentaNYL  (DURAGESIC ) 12 MCG/HR Place 1 patch onto the skin every 3 (three) days. 09/04/24  Yes Samtani, Jai-Gurmukh, MD  ferrous sulfate  325 (  65 FE) MG tablet Take 325 mg by mouth every evening.   Yes [provider]  folic acid  (FOLVITE ) 1 MG tablet Take 1 mg by mouth every evening. 09/18/21  Yes [provider]  gabapentin   (NEURONTIN ) 100 MG capsule Take 100 mg by mouth 3 (three) times daily.   Yes [provider]  insulin  aspart (NOVOLOG  FLEXPEN) 100 UNIT/ML FlexPen Inject 4-12 Units into the skin See admin instructions. Inject 4 units subcutaneously three times daily before meals, in ADDITION to 0-12 units per sliding scale: 70 - 150 : 0 units 151-200 : 2 units 201-250 : 4 units 251-300 : 6 units 301-350 : 8 units 351-400 : 10 units 401-450 : 12 units > 400 : give 12 units and recheck in 1 hour - if still > 400, call MD/NP for further orders.   Yes [provider]  oxyCODONE  (OXY IR/ROXICODONE ) 5 MG immediate release tablet Take 1 tablet (5 mg total) by mouth every 6 (six) hours as needed for moderate pain (pain score 4-6) or severe pain (pain score 7-10). 09/02/24  Yes Samtani, Jai-Gurmukh, MD  Phenylephrine  HCl (PREPARATION H) 0.25 % SUPP Place 1 suppository rectally every 6 (six) hours as needed (hemorrhoids).   Yes [provider]  polyethylene glycol (MIRALAX  / GLYCOLAX ) 17 g packet Take 17 g by mouth daily as needed for moderate constipation. 09/02/24  Yes Samtani, Jai-Gurmukh, MD  rOPINIRole  (REQUIP ) 2 MG tablet Take 2 mg by mouth at bedtime.   Yes [provider]  senna (SENOKOT) 8.6 MG TABS tablet Take 17.2 mg by mouth every evening.   Yes [provider]  sevelamer  carbonate (RENVELA ) 800 MG tablet Take 1 tablet (800 mg total) by mouth 3 (three) times daily with meals. 09/02/24  Yes Samtani, Jai-Gurmukh, MD  tamsulosin  (FLOMAX ) 0.4 MG CAPS capsule Take 0.4 mg by mouth every evening.   Yes [provider]  amoxicillin  (AMOXIL ) 500 MG capsule Take 500 mg by mouth 2 (two) times daily. Patient not taking: Reported on 10/06/2024    [provider]   Current Facility-Administered Medications  Medication Dose Route Frequency Provider Last Rate Last Admin   Chlorhexidine  Gluconate Cloth 2 % PADS 6 each  6 each Topical Daily Antonetta Moccasin B, NP        dexmedetomidine  (PRECEDEX ) 400 MCG/100ML (4 mcg/mL) infusion  0-1.2 mcg/kg/hr Intravenous Continuous Antonetta Moccasin B, NP 33.7 mL/hr at 10/06/24 1249 1.2 mcg/kg/hr at 10/06/24 1249   etomidate  (AMIDATE ) injection   Intravenous Code/Trauma/Sedation Med Sharie Bourbon, MD   20 mg at 10/06/24 1227   fentaNYL  (SUBLIMAZE ) injection 50 mcg  50 mcg Intravenous Q15 min PRN Simpson, Paula B, NP       fentaNYL  (SUBLIMAZE ) injection 50-200 mcg  50-200 mcg Intravenous Q30 min PRN Antonetta Moccasin B, NP   100 mcg at 10/06/24 1239   ipratropium-albuterol  (DUONEB) 0.5-2.5 (3) MG/3ML nebulizer solution 3 mL  3 mL Nebulization Q4H Antonetta Moccasin B, NP       naloxone  (NARCAN ) injection   Intravenous Code/Trauma/Sedation Med Alverda, Bourbon, MD   4 mg at 10/06/24 1155   Oral care mouth rinse  15 mL Mouth Rinse Q2H Antonetta Moccasin B, NP       Oral care mouth rinse  15 mL Mouth Rinse PRN Antonetta Moccasin B, NP       pantoprazole  (PROTONIX ) injection 40 mg  40 mg Intravenous Daily Antonetta Moccasin B, NP       piperacillin -tazobactam (ZOSYN ) IVPB 2.25 g  2.25 g Intravenous Q8H Hussein, Lenny, MD       rocuronium  (ZEMURON ) injection   Intravenous Code/Trauma/Sedation Med Sharie Lenny, MD   100 mg at 10/06/24 1227   Current Outpatient Medications  Medication Sig Dispense Refill   apixaban  (ELIQUIS ) 5 MG TABS tablet Take 1 tablet (5 mg total) by mouth 2 (two) times daily. 60 tablet 1   atorvastatin  (LIPITOR ) 80 MG tablet Take 1 tablet (80 mg total) by mouth at bedtime. (Patient taking differently: Take 80 mg by mouth every evening.)     carvedilol  (COREG ) 12.5 MG tablet Take 1 tablet (12.5 mg total) by mouth 2 (two) times daily with a meal.     colchicine  0.6 MG tablet Take 0.5 tablets (0.3 mg total) by mouth daily. (Patient taking differently: Take 0.3 mg by mouth every evening.)     CREAM BASE EX Apply 1 Application topically in the morning and at bedtime. Apply to incontinence area  PRN order: apply twice  daily as needed for barrier cream     ethyl chloride spray Apply 1 Application topically See admin instructions. Apply to vascular access in the morning on Tuesday, Thursday, and Saturday.     famotidine  (PEPCID ) 20 MG tablet Take 20 mg by mouth at bedtime.     fentaNYL  (DURAGESIC ) 12 MCG/HR Place 1 patch onto the skin every 3 (three) days. 5 patch 0   ferrous sulfate  325 (65 FE) MG tablet Take 325 mg by mouth every evening.     folic acid  (FOLVITE ) 1 MG tablet Take 1 mg by mouth every evening.     gabapentin  (NEURONTIN ) 100 MG capsule Take 100 mg by mouth 3 (three) times daily.     insulin  aspart (NOVOLOG  FLEXPEN) 100 UNIT/ML FlexPen Inject 4-12 Units into the skin See admin instructions. Inject 4 units subcutaneously three times daily before meals, in ADDITION to 0-12 units per sliding scale: 70 - 150 : 0 units 151-200 : 2 units 201-250 : 4 units 251-300 : 6 units 301-350 : 8 units 351-400 : 10 units 401-450 : 12 units > 400 : give 12 units and recheck in 1 hour - if still > 400, call MD/NP for further orders.     oxyCODONE  (OXY IR/ROXICODONE ) 5 MG immediate release tablet Take 1 tablet (5 mg total) by mouth every 6 (six) hours as needed for moderate pain (pain score 4-6) or severe pain (pain score 7-10). 30 tablet 0   Phenylephrine  HCl (PREPARATION H) 0.25 % SUPP Place 1 suppository rectally every 6 (six) hours as needed (hemorrhoids).     polyethylene glycol (MIRALAX  / GLYCOLAX ) 17 g packet Take 17 g by mouth daily as needed for moderate constipation.     rOPINIRole  (REQUIP ) 2 MG tablet Take 2 mg by mouth at bedtime.     senna (SENOKOT) 8.6 MG TABS tablet Take 17.2 mg by mouth every evening.     sevelamer  carbonate (RENVELA ) 800 MG tablet Take 1 tablet (800 mg total) by mouth 3 (three) times daily with meals.     tamsulosin  (FLOMAX ) 0.4 MG CAPS capsule Take 0.4 mg by mouth every evening.     amoxicillin  (AMOXIL ) 500 MG capsule Take 500 mg by mouth 2 (two) times daily. (Patient not taking:  Reported on 10/06/2024)     Labs: Basic Metabolic Panel: Recent Labs  Lab 10/06/24 0809 10/06/24 0845 10/06/24 1215  NA 141 141 142  K 4.9 4.6 4.8  CL 99 100  --   CO2 25  --   --  GLUCOSE 167* 160*  --   BUN 59* 54*  --   CREATININE 8.04* 8.40*  --   CALCIUM  8.7*  --   --    Liver Function Tests: Recent Labs  Lab 10/06/24 0809  AST 19  ALT 13  ALKPHOS 128*  BILITOT 0.7  PROT 5.9*  ALBUMIN  3.3*   No results for input(s): LIPASE, AMYLASE in the last 168 hours. Recent Labs  Lab 10/06/24 1053  AMMONIA 63*   CBC: Recent Labs  Lab 10/06/24 0809 10/06/24 0845 10/06/24 1215  WBC 9.5  --   --   NEUTROABS 7.4  --   --   HGB 10.1* 10.5* 11.6*  HCT 33.0* 31.0* 34.0*  MCV 95.9  --   --   PLT 164  --   --    Cardiac Enzymes: No results for input(s): CKTOTAL, CKMB, CKMBINDEX, TROPONINI in the last 168 hours. CBG: Recent Labs  Lab 10/06/24 1208  GLUCAP 138*   Iron Studies: No results for input(s): IRON, TIBC, TRANSFERRIN, FERRITIN in the last 72 hours. Studies/Results: DG Hand Complete Left Result Date: 10/06/2024 EXAM: 3 OR MORE VIEW(S) XRAY OF THE LEFT HAND 10/06/2024 08:48:00 AM COMPARISON: 08/31/2024 CLINICAL HISTORY: 69 year old male with a wound. FINDINGS: BONES AND JOINTS: Limited evaluation of the digits because of flexion on all views. Mild degenerative changes. No acute fracture. No malalignment. SOFT TISSUES: 3 mm metallic density along the dorsal medial aspect of the wrist, chronic and stable retained foreign body. Extensive vascular calcifications. Generalized soft tissue swelling at the proximal hand and wrist has increased since last month. IMPRESSION: 1. No acute osseous abnormality identified about the left hand. 2. Generalized proximal hand and wrist soft tissue swelling is new or increased. 3. Very severe calcified peripheral vascular disease. Electronically signed by: Helayne Hurst MD 10/06/2024 09:08 AM EST RP Workstation: [id number]    DG Hand 2 View Right Result Date: 10/06/2024 EXAM: 1 or 2 VIEW(S) XRAY OF THE RIGHT HAND 10/06/2024 08:48:00 AM COMPARISON: Right hand series 08/31/2024. CLINICAL HISTORY: 69 year old male with an extremity wound. FINDINGS: BONES AND JOINTS: No acute fracture. No malalignment. Moderate first CMC degeneration. Mild first MCP and IP osteoarthritis. SOFT TISSUES: Pulse oximeter artifact over the right 2nd finger. Incidental intravenous access artifact at the wrist. Vascular calcifications. IMPRESSION: 1. No acute osseous abnormality identified about the right hand. 2. Very severe calcified peripheral vascular disease. Electronically signed by: Helayne Hurst MD 10/06/2024 09:06 AM EST RP Workstation: [id number]   DG Chest Port 1 View Result Date: 10/06/2024 EXAM: 1 VIEW(S) XRAY OF THE CHEST 10/06/2024 08:48:00 AM COMPARISON: 09/18/2024 CLINICAL HISTORY: 69 year old male with query sepsis, increasing confusion, and lethargy for 3 days. Evaluate for abnormality. FINDINGS: LUNGS AND PLEURA: Ongoing and mildly progressed dense left lung base opacification obscuring the left hemidiaphragm. This may all be pleural effusion, but a combination of consolidation and pleural effusion is possible. Stable nearby linear platelike atelectasis. Streaky right lung base opacity also appears increased. No pneumothorax. HEART AND MEDIASTINUM: Cardiomegaly. Calcified aorta. BONES AND SOFT TISSUES: Cervical fusion hardware noted. No acute osseous abnormality. ABDOMEN: Paucity of visible bowel gas in the abdomen. IMPRESSION: 1. Increasing dense left lung base opacity, likely in part due to pleural effusion, but also suspicious for pneumonia. Increasing streaky right lung base opacity, atelectasis vs infection. 2. Cardiomegaly. Electronically signed by: Helayne Hurst MD 10/06/2024 09:05 AM EST RP Workstation: [id number]    Dialysis Orders:  Nichole Morita on TTS 180NRe EDW 106kg BFR 450 DFR 800 4hr  2K 2.5Ca No  heparin  Mircera 200mcg IV q 2 weeks- last 10/03/24 Venofer  50mg  weekly Hectorol  2mcg Iv q HD  Assessment/Plan:  AMS: Started on sepsis protocol, had recent admission for AMS and had pneumonia at that time. Work up per admitting team  ESRD:  On TTS Saturday, last HD was Saturday but shortened due to weather. Will attempt to get him on the schedule for HD today, if there is polypharmacy component to his AMS, dialysis should help.  Hypertension/volume: BP soft, received 1L LR in the ED. Does not appear volume overloaded on exam  Anemia: Hgb at goal, Not due for ESA  Metabolic bone disease: Calcium  controlled, follow phos. Resume binders once eating  Nutrition:  Currently NPO  Lucie Collet, PA-C 10/06/2024, 12:53 PM  [facility] Pager: [phone]     [1]  Allergies Allergen Reactions   Contrast Media [Iodinated Contrast Media] Shortness Of Breath and Other (See Comments)    Altered mental status     Ivp Dye [Iodinated Contrast Media] Anaphylaxis, Shortness Of Breath and Other (See Comments)    Altered mental status     Latex Rash    severe rash appears after the first 24 hours of being placed   Tape Rash and Other (See Comments)    Rash after 1 day of use  Do not leave on longer then 3 days without changing

## 2024-10-06 NOTE — Plan of Care (Signed)
   Problem: Education: Goal: Ability to describe self-care measures that may prevent or decrease complications (Diabetes Survival Skills Education) will improve Outcome: Progressing Goal: Individualized Educational Video(s) Outcome: Progressing   Problem: Coping: Goal: Ability to adjust to condition or change in health will improve Outcome: Progressing   Problem: Fluid Volume: Goal: Ability to maintain a balanced intake and output will improve Outcome: Progressing   Problem: Health Behavior/Discharge Planning: Goal: Ability to identify and utilize available resources and services will improve Outcome: Progressing Goal: Ability to manage health-related needs will improve Outcome: Progressing   Problem: Metabolic: Goal: Ability to maintain appropriate glucose levels will improve Outcome: Progressing   Problem: Nutritional: Goal: Maintenance of adequate nutrition will improve Outcome: Progressing Goal: Progress toward achieving an optimal weight will improve Outcome: Progressing   Problem: Skin Integrity: Goal: Risk for impaired skin integrity will decrease Outcome: Progressing   Problem: Tissue Perfusion: Goal: Adequacy of tissue perfusion will improve Outcome: Progressing   Problem: Activity: Goal: Ability to tolerate increased activity will improve Outcome: Progressing   Problem: Respiratory: Goal: Ability to maintain a clear airway and adequate ventilation will improve Outcome: Progressing   Problem: Role Relationship: Goal: Method of communication will improve Outcome: Progressing   Problem: Education: Goal: Knowledge of General Education information will improve Description: Including pain rating scale, medication(s)/side effects and non-pharmacologic comfort measures Outcome: Progressing   Problem: Health Behavior/Discharge Planning: Goal: Ability to manage health-related needs will improve Outcome: Progressing   Problem: Clinical Measurements: Goal:  Ability to maintain clinical measurements within normal limits will improve Outcome: Progressing Goal: Will remain free from infection Outcome: Progressing Goal: Diagnostic test results will improve Outcome: Progressing Goal: Respiratory complications will improve Outcome: Progressing Goal: Cardiovascular complication will be avoided Outcome: Progressing   Problem: Activity: Goal: Risk for activity intolerance will decrease Outcome: Progressing   Problem: Nutrition: Goal: Adequate nutrition will be maintained Outcome: Progressing   Problem: Coping: Goal: Level of anxiety will decrease Outcome: Progressing   Problem: Elimination: Goal: Will not experience complications related to bowel motility Outcome: Progressing Goal: Will not experience complications related to urinary retention Outcome: Progressing   Problem: Pain Managment: Goal: General experience of comfort will improve and/or be controlled Outcome: Progressing   Problem: Safety: Goal: Ability to remain free from injury will improve Outcome: Progressing   Problem: Skin Integrity: Goal: Risk for impaired skin integrity will decrease Outcome: Progressing

## 2024-10-06 NOTE — ED Triage Notes (Signed)
 Pt bib [facility] from [facility] pt has been confused and lethargic x 3 days. A&Ox2, alert to verbal stimuli. Initial pressure in 60s, given 700 LR. LBP 74/60, in afib 70-110, hx of afib. Dialysis pt. Bilateral bka. 4L O2, 80% ra. 28 rr  18G RAC

## 2024-10-06 NOTE — H&P (Signed)
 "  NAME:  [name], MRN:  [id number], DOB:  1956/04/06, LOS: 0 ADMISSION DATE:  10/06/2024, CONSULTATION DATE:  10/06/24 REFERRING MD:  Warren NOVAK, PA, CHIEF COMPLAINT:  weakness   History of Present Illness:   Tobacco abuse, Afib on eliquis , DM, OSA, COPD, HTN, HLD, CAD, PAD s/p bilateral BKA, ESRD on HD, anemia, GERD, chronic pain, and sacral decubitus  Of note, 3 recent hospitalization since 12/2 for volume overload, unintentional  opiate overdose, and recent 09/28/24  for metabolic encephalopathy, sepsis secondary to PNA with proteus bacteremia, and underwent amputation of left ring finger for gangrene on 1/15 discharged to SNF.  Last iHD on 10/03/24.  Sent from [facility] for lethargy, confusion, and generalized weakness for last 2-3 days, found hypoxic at 80% on RA, tachycardic, temp 100.5, febrile, and hypotensive with SBP in the 60's initially improved after 1.5 L of IVF but still altered and SBP 80-90s.  Labs with WBC 9.5, H/H 10.1/ 33, lactic 2.2> 0.8 after fluids, pBNP > 35,000, glucose 167, BUN/sCr 59/ 8, albumin  3.3, ALP 128, INR 2.9, ammonia 63, SARS/ flu/ RSV neg.  Given concern for sepsis, blood cultures sent, started on empiric cefepime  and vancomycin .  One fentanyl  12.5mcg patch was removed by ER.  PCCM consulted given altered mental status and lower Bps.    On PCCM evaluation, pt minimally responsive to noxious stimuli with more shallow respirations.  Additional fentanyl  patch found and removed from right shoulder.  S/p narcan  x 2 in ER with improvement in BP and pt starting to move more, some concern for left facial droop and not as much activity in LUE and some movements, possibly myoclonus.  Plans for stat CTH. Neuro consulted.  VBG 7.356/ 52.  Called and spoke with wife who confirmed pt was a full code for now.   Pt was intubated while in ER for airway protection prior to [facility].   Pertinent  Medical History   Past Medical History:  Diagnosis Date   Carotid artery occlusion  11/10/2010   LEFT CAROTID ENDARTERECTOMY   Complication of anesthesia    BP WENT UP AT [facility]    COPD (chronic obstructive pulmonary disease) (HCC)    pt denies this dx as of 06/01/20 - no inhaler    Diabetes mellitus without complication (HCC)    Diverticulitis    Diverticulosis of colon (without mention of hemorrhage)    DJD (degenerative joint disease)    knees/hands/feet/back/neck   ESRD (end stage renal disease) on dialysis (HCC)    Fatty liver    Full dentures    GERD (gastroesophageal reflux disease)    H/O hiatal hernia    History of blood transfusion    with a past surical procedure per patient 06/01/20   History of right below knee amputation (HCC) 07/12/2016   Hyperlipidemia    Hypertension    Neuromuscular disorder (HCC)    peripheral neuropathy   Non-pressure chronic ulcer of other part of left foot limited to breakdown of skin (HCC) 11/12/2016   Nonrheumatic aortic (valve) stenosis 03/18/2024   Limited TTE 03/18/2024: EF 55-60, moderate LVH, normal RVSF, moderate LAE, mild MR, mild-moderate AS (mean 12, V-max 235 cm/s, DI 0.35), aortic root 39 mm, moderate pleural effusion, trivial pericardial effusion    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 Atherectomy left popliteal artery July 2012.    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 rate (!) 23, SpO2 100%.        Intake/Output Summary (Last 24 hours) at 10/06/2024 1123 Last data filed at 10/06/2024 1023 Gross per 24 hour  Intake 1500 ml  Output --  Net 1500 ml   There were no vitals filed for this visit.  Examination: General:  chronically ill appearing elderly male in bed HEENT: MM pink/dry, pupils 2/r, elevated JVP Neuro: minimal response to noxious stimuli> after narcan  still not opening eyes, verbal, minimal withdraw to pain, some spont movement in all extremities, not as much in LUE and concern for left facial droop.  Also neck noted to have limited ROM CV: rr, NSR, +murmur, LUE AVF +b/t PULM:  shallow, clear anteriorly, no wheeze GI: obese, soft, bs+, NT Extremities: warm/dry, bilateral BKA  Skin: skin tear to R forearm, arms wrapped. Not able to assess posterior at this time, see pics of R hand and left ring finger below        Resolved problem list   Assessment and Plan   Acute metabolic/ toxic encephalopathy  Chronic pain  - in setting of opiate use (2 fentanyl  patches removed, pt from SNF, hx of prior unintentional opiate OD in December),  r/o intracranial ICH/ embolism as on eliquis , concern for myoclonus, possible Neurontin  toxicity (on 100mg  TID), and rule out CNS process.  No significant hypercarbia to contribute P:  - appreciate Neurology input - CTH stat.  Given IV contrast allergy listed as anaphylaxis, defer CTA head/ neck and will go for stat MRA/ MRI - cEEG - ammonia 63> lactulose  as above, repeat in am - serial neuro exams  - maintain neuro protective measures; goal for eurothermia, euglycemia, eunatermia, normoxia, and PCO2 goal of 35-40 - hold pta gabapentin , oxycodone    Acute respiratory insufficiency related to above LLL PNA COPD OSA Tobacco abuse  - s/p intubation  - full LTVV support, 4-8cc/kg IBW with goal Pplat <30 and DP<15  - VAP prevention protocol/ PPI - PAD protocol for  sedation> precedex / prn fentanyl   with scheduled bowel regimen w/ lactulose  with ammonia level - post intubation CXR/ ABG - wean FiO2 as able for SpO2 >92%  - daily SAT & SBT when appropriate  - send trach asp - prn BD   Severe sepsis Lactic acidosis, resolved  - in setting of temp 100.5, lactic acidosis, hypotension, AMS and LLL opacity P:  - goal MAP > 60 - elevated JVP, avoid further fluids, if hypotensive> start peripheral NE - lactic normalized - cont broad spectrum abx for now pending cultures> zosyn , vanc.   - pan-culture - pending MRI/ MRA and unclear last dose of eliquis , may consider LP    ESRD - TTS, AVF LUE - last iHD 1/24 - nephrology consulted.  No emergent electrolyte derangements for emergent HD currently - trend renal indices    Hypocalcemia - iCa 1.05> calcium   gluconate 1gm   Aortic stenosis  CAD s/pt stents 2016 HFpEF EF  HTN PAD s/p bilateral BKA HLD - resume Lipitor  - hold coreg  12.5mg  BID with soft Bps - volume removal per HD   Afib on Eliquis   - hold eliquis  for pending CTH/ MRI - rate controlled on telemetry    Anemia of CKD  DMT2 - CBG q 4 - add prn SSI if glucose > 180  Wounds- R hand, previous left ring finger amputation, POA - unable to visualize posterior - WOC  GOC Deconditioning - wife updated on plan of care, confirms full code status for now.  Pt has prior been DNR but wife states he has expressed his desire for full code but also expresses concern that pt is just not recovering.   This is his 4th admission since December.    Labs   CBC: Recent Labs  Lab 10/06/24 0809 10/06/24 0845  WBC 9.5  --   NEUTROABS 7.4  --   HGB 10.1* 10.5*  HCT 33.0* 31.0*  MCV 95.9  --   PLT 164  --     Basic Metabolic Panel: Recent Labs  Lab 10/06/24 0809 10/06/24 0845  NA 141 141  K 4.9 4.6  CL 99 100  CO2 25  --   GLUCOSE 167* 160*  BUN 59* 54*  CREATININE 8.04* 8.40*  CALCIUM  8.7*  --    GFR: Estimated Creatinine  Clearance: 10.9 mL/min (A) (by C-G formula based on SCr of 8.4 mg/dL (H)). Recent Labs  Lab 10/06/24 0809 10/06/24 0846 10/06/24 1059  WBC 9.5  --   --   LATICACIDVEN  --  2.2* 0.8    Liver Function Tests: Recent Labs  Lab 10/06/24 0809  AST 19  ALT 13  ALKPHOS 128*  BILITOT 0.7  PROT 5.9*  ALBUMIN  3.3*   No results for input(s): LIPASE, AMYLASE in the last 168 hours. No results for input(s): AMMONIA in the last 168 hours.  ABG    Component Value Date/Time   PHART 7.364 02/13/2024 1550   PCO2ART 36.7 02/13/2024 1550   PO2ART 112 (H) 02/13/2024 1550   HCO3 25.1 09/19/2024 2040   TCO2 29 10/06/2024 0845   ACIDBASEDEF 1.0 09/19/2024 0345   O2SAT 89.9 09/19/2024 2040     Coagulation Profile: Recent Labs  Lab 10/06/24 0809  INR 2.9*    Cardiac Enzymes: No results for input(s): CKTOTAL, CKMB, CKMBINDEX, TROPONINI in the last 168 hours.  HbA1C: Hgb A1c MFr Bld  Date/Time Value Ref Range Status  08/28/2024 05:50 AM 6.9 (H) 4.8 - 5.6 % Final    Comment:    (NOTE) Diagnosis of Diabetes The following HbA1c ranges recommended by the American Diabetes Association (ADA) may be used as an aid in the diagnosis of diabetes mellitus.  Hemoglobin             Suggested A1C NGSP%              Diagnosis  <5.7                   Non Diabetic  5.7-6.4                Pre-Diabetic  >6.4                   Diabetic  <7.0                   Glycemic control for  adults with diabetes.    08/12/2024 02:14 AM 6.4 (H) 4.8 - 5.6 % Final    Comment:    (NOTE)         Prediabetes: 5.7 - 6.4         Diabetes: >6.4         Glycemic control for adults with diabetes: <7.0     CBG: No results for input(s): GLUCAP in the last 168 hours.  Review of Systems:   Unable given encephalopathy   Past Medical History:  He,  has a past medical history of Carotid artery occlusion (11/10/2010), Complication of anesthesia, COPD (chronic obstructive  pulmonary disease) (HCC), Diabetes mellitus without complication (HCC), Diverticulitis, Diverticulosis of colon (without mention of hemorrhage), DJD (degenerative joint disease), ESRD (end stage renal disease) on dialysis (HCC), Fatty liver, Full dentures, GERD (gastroesophageal reflux disease), H/O hiatal hernia, History of blood transfusion, History of right below knee amputation (HCC) (07/12/2016), Hyperlipidemia, Hypertension, Neuromuscular disorder (HCC), Non-pressure chronic ulcer of other part of left foot limited to breakdown of skin (HCC) (11/12/2016), Nonrheumatic aortic (valve) stenosis (03/18/2024), Osteomyelitis (HCC), PAD (peripheral artery disease), Pseudoclaudication (11/15/2018), S/P BKA (below knee amputation), right (HCC) (09/18/2018), Sleep apnea, Slurred speech, Status post reversal of ileostomy (05/21/2018), 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   Surgical History:   Past Surgical History:  Procedure Laterality Date   ABDOMINAL AORTOGRAM W/LOWER EXTREMITY N/A 06/23/2021   Procedure: ABDOMINAL AORTOGRAM W/LOWER EXTREMITY;  Surgeon: Elmira Newman PARAS, MD;  Location: [facility];  Service: Cardiovascular;  Laterality: N/A;   AMPUTATION  11/05/2011   Procedure: AMPUTATION RAY;  Surgeon: Norleen Armor, MD;  Location: [facility] OR;  Service: Orthopedics;  Laterality: Right;  Amputation of Right 4&5th Toes   AMPUTATION Left 11/26/2012   Procedure: AMPUTATION RAY;  Surgeon: Norleen Armor, MD;  Location: [facility] OR;  Service: Orthopedics;  Laterality: Left;  fourth ray amputation   AMPUTATION Right 08/27/2014   Procedure: Transmetatarsal Amputation;  Surgeon: Jerona Harden GAILS, MD;  Location: [facility] OR;  Service: Orthopedics;  Laterality: Right;   AMPUTATION Right 01/14/2015   Procedure: AMPUTATION BELOW KNEE;  Surgeon: Jerona Harden GAILS, MD;  Location: [facility] OR;  Service: Orthopedics;  Laterality: Right;   AMPUTATION Left 10/21/2015   Procedure: Left Foot  5th Ray Amputation;  Surgeon: Jerona Harden GAILS, MD;  Location: [facility] OR;  Service: Orthopedics;  Laterality: Left;   AMPUTATION Left 07/04/2022   Procedure: LEFT BELOW KNEE AMPUTATION;  Surgeon: Harden Jerona GAILS, MD;  Location: [facility] OR;  Service: Orthopedics;  Laterality: Left;   AMPUTATION FINGER Left 09/23/2024   Procedure: AMPUTATION, FINGER;  Surgeon: Arlinda Buster, MD;  Location: [facility] OR;  Service: Orthopedics;  Laterality: Left;  LEFT RING FINGER AMPUTATION   ANTERIOR FUSION CERVICAL SPINE  02/06/2006   C4-5, C5-6, C6-7; SURGEON DR. MAX COHEN   AV FISTULA PLACEMENT Left 06/02/2020   Procedure: ARTERIOVENOUS (AV) FISTULA CREATION LEFT;  Surgeon: Sheree Penne Bruckner, MD;  Location: [facility] OR;  Service: Vascular;  Laterality: Left;   BACK SURGERY     x 3   BASCILIC VEIN TRANSPOSITION Left 07/21/2020   Procedure: LEFT UPPER ARM ATERIOVENOUS SUPERFISTULALIZATION;  Surgeon: Sheree Penne Bruckner, MD;  Location: [facility] OR;  Service: Vascular;  Laterality: Left;   BELOW KNEE LEG AMPUTATION Right    CARDIAC CATHETERIZATION  10/31/2004   2009   CAROTID ENDARTERECTOMY  11/10/2010   CAROTID ENDARTERECTOMY Left 11/10/2010   Subtotal occlusion of left internal carotid artery with left  hemispheric transient ischemic attacks.   CAROTID STENT     CARPAL TUNNEL RELEASE Right 10/21/2013   Procedure: RIGHT CARPAL TUNNEL RELEASE;  Surgeon: Arley JONELLE Curia, MD;  Location: [facility];  Service: Orthopedics;  Laterality: Right;   CHOLECYSTECTOMY     COLON SURGERY     COLONOSCOPY     COLOSTOMY REVERSAL  05/21/2018   ileostomy reversal   CORONARY LITHOTRIPSY N/A 11/24/2023   Procedure: CORONARY LITHOTRIPSY;  Surgeon: Jordan, Peter M, MD;  Location: [facility] INVASIVE CV LAB;  Service: Cardiovascular;  Laterality: N/A;   CORONARY STENT INTERVENTION N/A 11/24/2023   Procedure: CORONARY STENT INTERVENTION;  Surgeon: Jordan, Peter M, MD;  Location: [facility] INVASIVE CV LAB;  Service: Cardiovascular;  Laterality: N/A;    CYSTOSCOPY WITH STENT PLACEMENT Bilateral 01/13/2018   Procedure: CYSTOSCOPY WITH BILATERAL URETERAL CATHETER PLACEMENT;  Surgeon: Cam Morene ORN, MD;  Location: [facility] ORS;  Service: Urology;  Laterality: Bilateral;   ESOPHAGEAL MANOMETRY Bilateral 07/19/2014   Procedure: ESOPHAGEAL MANOMETRY (EM);  Surgeon: Gordy CHRISTELLA Starch, MD;  Location: [facility];  Service: Gastroenterology;  Laterality: Bilateral;   EYE SURGERY Bilateral 2020   cataract   FEMORAL ARTERY STENT     x6   FINGER SURGERY     FOOT SURGERY  04/25/2016    EXCISION BASE 5TH METATARSAL AND PARTIAL CUBOID LEFT FOOT   HERNIA REPAIR     LEFT INGUINAL AND UMBILICAL REPAIRS   HERNIA REPAIR     I & D EXTREMITY Left 04/25/2016   Procedure: EXCISION BASE 5TH METATARSAL AND PARTIAL CUBOID LEFT FOOT;  Surgeon: Jerona LULLA Sage, MD;  Location: [facility] OR;  Service: Orthopedics;  Laterality: Left;   ILEOSTOMY  01/13/2018   Procedure: ILEOSTOMY;  Surgeon: Signe Mitzie LABOR, MD;  Location: [facility];  Service: General;;   ILEOSTOMY CLOSURE N/A 05/21/2018   Procedure: ILEOSTOMY REVERSAL ERAS PATHWAY;  Surgeon: Signe Mitzie LABOR, MD;  Location: [facility];  Service: General;  Laterality: N/A;   IR RADIOLOGIST EVAL & MGMT  11/19/2017   IR RADIOLOGIST EVAL & MGMT  12/03/2017   IR RADIOLOGIST EVAL & MGMT  12/18/2017   JOINT REPLACEMENT Right 2001   Total knee   LAMINECTOMY     X 3 LUMBAR AND X 2 CERVICAL SPINE OPERATIONS   LAPAROSCOPIC CHOLECYSTECTOMY W/ CHOLANGIOGRAPHY  11/09/2004   SURGEON DR. DEWARD CANDIE NULL   LEFT HEART CATH AND CORONARY ANGIOGRAPHY N/A 09/16/2018   Procedure: LEFT HEART CATH AND CORONARY ANGIOGRAPHY;  Surgeon: Elmira Newman PARAS, MD;  [facility];  Service: Cardiovascular;  Laterality: N/A;   LEFT HEART CATH AND CORONARY ANGIOGRAPHY N/A 11/24/2023   Procedure: LEFT HEART CATH AND CORONARY ANGIOGRAPHY;  Surgeon: Jordan, Peter M, MD;  Location: [facility] INVASIVE CV LAB;  Service: Cardiovascular;  Laterality: N/A;   LEFT  HEART CATHETERIZATION WITH CORONARY ANGIOGRAM N/A 10/29/2014   Procedure: LEFT HEART CATHETERIZATION WITH CORONARY ANGIOGRAM;  Surgeon: Erick JONELLE Bergamo, MD;  Location: [facility] CATH LAB;  Service: Cardiovascular;  Laterality: N/A;   LIGATION OF COMPETING BRANCHES OF ARTERIOVENOUS FISTULA Left 07/21/2020   Procedure: LIGATION OF COMPETING BRANCHES OF LEFT UPPER ARM ARTERIOVENOUS FISTULA;  Surgeon: Sheree Penne Bruckner, MD;  Location: [facility] OR;  Service: Vascular;  Laterality: Left;   LOWER EXTREMITY ANGIOGRAM N/A 03/19/2012   Procedure: LOWER EXTREMITY ANGIOGRAM;  Surgeon: Bruckner JONETTA Cash, MD;  Location: [facility] CATH LAB;  Service: Cardiovascular;  Laterality: N/A;   LOWER EXTREMITY ANGIOGRAPHY N/A 06/20/2021   Procedure: LOWER EXTREMITY  ANGIOGRAPHY;  Surgeon: Elmira Newman PARAS, MD;  [facility];  Service: Cardiovascular;  Laterality: N/A;   NECK SURGERY     PARTIAL COLECTOMY N/A 01/13/2018   Procedure: LAPAROSCOPIC ASSISTED   SIGMOID COLECTOMY ILEOSTOMY;  Surgeon: Signe Mitzie LABOR, MD;  Location: [facility];  Service: General;  Laterality: N/A;   PENILE PROSTHESIS IMPLANT  08/14/2005   INFRAPUBIC INSERTION OF INFLATABLE PENILE PROSTHESIS; SURGEON DR. JANIT   PENILE PROSTHESIS IMPLANT     PERCUTANEOUS CORONARY STENT INTERVENTION (PCI-S) Right 10/29/2014   Procedure: PERCUTANEOUS CORONARY STENT INTERVENTION (PCI-S);  Surgeon: Erick JONELLE Bergamo, MD;  Location: [facility] CATH LAB;  Service: Cardiovascular;  Laterality: Right;   PERICARDIOCENTESIS N/A 02/13/2024   Procedure: PERICARDIOCENTESIS;  Surgeon: Elmira Newman PARAS, MD;  [facility];  Service: Cardiovascular;  Laterality: N/A;   PERIPHERAL VASCULAR INTERVENTION Left 06/23/2021   Procedure: PERIPHERAL VASCULAR INTERVENTION;  Surgeon: Elmira Newman PARAS, MD;  [facility];  Service: Cardiovascular;  Laterality: Left;   REMOVAL OF PENILE PROSTHESIS N/A 06/14/2021   Procedure: Removal of THREE piece  inflatable penile prosthesis;  Surgeon: Carolee Sherwood JONETTA DOUGLAS, MD;  Location: [facility] OR;  Service: Urology;  Laterality: N/A;   SHOULDER ARTHROSCOPY     SPINE SURGERY     TEMPORARY PACEMAKER N/A 02/13/2024   Procedure: TEMPORARY PACEMAKER;  Surgeon: Elmira Newman PARAS, MD;  [facility]: [facility];  Service: Cardiovascular;  Laterality: N/A;   TOE AMPUTATION Left    TONSILLECTOMY     TOTAL KNEE ARTHROPLASTY  07/2002   RIGHT KNEE ; SURGEON  DR. [name] ALSO HAD ARTHROSCOPIC RIGHT KNEE IN  10/2001   TOTAL KNEE ARTHROPLASTY     ULNAR NERVE TRANSPOSITION Right 10/21/2013   Procedure: RIGHT ELBOW  ULNAR NERVE DECOMPRESSION;  Surgeon: Arley JONELLE Curia, MD;  Location: [facility];  Service: Orthopedics;  Laterality: Right;     Social History:   reports that he has been smoking cigarettes. He has a 35 pack-year smoking history. He has never used smokeless tobacco. He reports that he does not currently use alcohol . He reports that he does not use drugs.   Family History:  His family history includes Arthritis in an other family member; Cancer in his father and sister; Colon cancer in his brother; Diabetes in his brother and father; Heart attack in his brother and father; Heart disease in his father; Heart disease (age of onset: 71) in his brother; Hyperlipidemia in his brother, father, and sister; Hypertension in his brother, father, sister, son, and another family member; Varicose Veins in his father.   Allergies Allergies[1]   Home Medications  Prior to Admission medications  Medication Sig Start Date End Date Taking? Authorizing Provider  apixaban  (ELIQUIS ) 5 MG TABS tablet Take 1 tablet (5 mg total) by mouth 2 (two) times daily. 01/14/22 05/10/25 Yes Gonfa, Taye T, MD  atorvastatin  (LIPITOR ) 80 MG tablet Take 1 tablet (80 mg total) by mouth at bedtime. Patient taking differently: Take 80 mg by mouth every evening. 11/25/23  Yes Amoako, Prince, MD  carvedilol  (COREG ) 12.5 MG tablet Take 1  tablet (12.5 mg total) by mouth 2 (two) times daily with a meal. 09/25/24  Yes Adhikari, Ivonne, MD  colchicine  0.6 MG tablet Take 0.5 tablets (0.3 mg total) by mouth daily. Patient taking differently: Take 0.3 mg by mouth every evening. 02/21/24  Yes Kathrin Mignon DASEN, MD  CREAM BASE EX Apply 1 Application topically in the morning and  at bedtime. Apply to incontinence area  PRN order: apply twice daily as needed for barrier cream   Yes [provider]  ethyl chloride spray Apply 1 Application topically See admin instructions. Apply to vascular access in the morning on Tuesday, Thursday, and Saturday. 09/14/24  Yes [provider]  famotidine  (PEPCID ) 20 MG tablet Take 20 mg by mouth at bedtime.   Yes [provider]  fentaNYL  (DURAGESIC ) 12 MCG/HR Place 1 patch onto the skin every 3 (three) days. 09/04/24  Yes Samtani, Jai-Gurmukh, MD  ferrous sulfate  325 (65 FE) MG tablet Take 325 mg by mouth every evening.   Yes [provider]  folic acid  (FOLVITE ) 1 MG tablet Take 1 mg by mouth every evening. 09/18/21  Yes [provider]  gabapentin  (NEURONTIN ) 100 MG capsule Take 100 mg by mouth 3 (three) times daily.   Yes [provider]  insulin  aspart (NOVOLOG  FLEXPEN) 100 UNIT/ML FlexPen Inject 4-12 Units into the skin See admin instructions. Inject 4 units subcutaneously three times daily before meals, in ADDITION to 0-12 units per sliding scale: 70 - 150 : 0 units 151-200 : 2 units 201-250 : 4 units 251-300 : 6 units 301-350 : 8 units 351-400 : 10 units 401-450 : 12 units > 400 : give 12 units and recheck in 1 hour - if still > 400, call MD/NP for further orders.   Yes [provider]  oxyCODONE  (OXY IR/ROXICODONE ) 5 MG immediate release tablet Take 1 tablet (5 mg total) by mouth every 6 (six) hours as needed for moderate pain (pain score 4-6) or severe pain (pain score 7-10). 09/02/24  Yes Samtani, Jai-Gurmukh, MD  Phenylephrine  HCl (PREPARATION  H) 0.25 % SUPP Place 1 suppository rectally every 6 (six) hours as needed (hemorrhoids).   Yes [provider]  polyethylene glycol (MIRALAX  / GLYCOLAX ) 17 g packet Take 17 g by mouth daily as needed for moderate constipation. 09/02/24  Yes Samtani, Jai-Gurmukh, MD  rOPINIRole  (REQUIP ) 2 MG tablet Take 2 mg by mouth at bedtime.   Yes [provider]  senna (SENOKOT) 8.6 MG TABS tablet Take 17.2 mg by mouth every evening.   Yes [provider]  sevelamer  carbonate (RENVELA ) 800 MG tablet Take 1 tablet (800 mg total) by mouth 3 (three) times daily with meals. 09/02/24  Yes Samtani, Jai-Gurmukh, MD  tamsulosin  (FLOMAX ) 0.4 MG CAPS capsule Take 0.4 mg by mouth every evening.   Yes [provider]  amoxicillin  (AMOXIL ) 500 MG capsule Take 500 mg by mouth 2 (two) times daily. Patient not taking: Reported on 10/06/2024    [provider]          CRITICAL CARE Performed by: Lyle Pesa   Total critical care time: 65 minutes  Critical care time was exclusive of separately billable procedures and treating other patients.  Critical care was necessary to treat or prevent imminent or life-threatening deterioration.  Critical care was time spent personally by me on the following activities: development of treatment plan with patient and/or surrogate as well as nursing, discussions with consultants, evaluation of patient's response to treatment, examination of patient, obtaining history from patient or surrogate, ordering and performing treatments and interventions, ordering and review of laboratory studies, ordering and review of radiographic studies, pulse oximetry and re-evaluation of patient's condition.    Lyle Pesa, NP [facility] 10/06/2024, 2:27 PM  See Amion for pager If no response to pager , please call [phone] until 7pm  After 7:00 pm call Elink  [phone]          [1]  Allergies Allergen Reactions    Contrast Media [Iodinated Contrast Media] Shortness Of Breath and Other (See Comments)    Altered mental status     Ivp Dye [Iodinated Contrast Media] Anaphylaxis, Shortness Of Breath and Other (See Comments)    Altered mental status     Latex Rash    severe rash appears after the first 24 hours of being placed   Tape Rash and Other (See Comments)    Rash after 1 day of use  Do not leave on longer then 3 days without changing   "

## 2024-10-06 NOTE — Sepsis Progress Note (Signed)
 SBP < 95 x2. Full amount of fluids contraindicated for ESRD on dialysis.

## 2024-10-06 NOTE — Progress Notes (Addendum)
 2020 - Called and spoke with MRI tech Venetia as patient has STAT MRI orders in since 1332. Clearance still not completed at this time.   2025 - Spoke with Dialysis RN Rollene who requested I obtain consent for iHD. Plan for patient to receive dialysis at bedside after midnight.   2030 - Spoke with MRI tech Venetia clearance completed and patient ready for MRI.   2035 - Spoke with EEG tech Ronold will notify when patient returns from MRI so EEG leads can be placed.   2040 - Verbal consent obtained by wife Katheryn for dialysis. Verified with Desmond, RN.   2230 - EEG tech notified patient is back in room from MRI.

## 2024-10-06 NOTE — Progress Notes (Signed)
 Pharmacy Antibiotic Note  [name] is a 69 y.o. male admitted on 10/06/2024 with sepsis.  Pharmacy has been consulted for vancomycin  dosing.  Vancomycin  2g Iv x 1 given in ED.  ESRD-HD usually TTS  Plan: Vancomycin  1g IV q HD Monitor HD schedule, Cx and clinical progression to narrow Vancomycin  random level as needed     Temp (24hrs), Avg:99.8 F (37.7 C), Min:99.1 F (37.3 C), Max:100.5 F (38.1 C)  Recent Labs  Lab 10/06/24 0809 10/06/24 0845 10/06/24 0846 10/06/24 1059 10/06/24 1217  WBC 9.5  --   --   --   --   CREATININE 8.04* 8.40*  --   --   --   LATICACIDVEN  --   --  2.2* 0.8 1.2    Estimated Creatinine Clearance: 10.9 mL/min (A) (by C-G formula based on SCr of 8.4 mg/dL (H)).    Allergies[1]  Dorn Poot, PharmD, [facility] Clinical Pharmacist ED Pharmacist Phone # [phone] 10/06/2024 2:56 PM     [1]  Allergies Allergen Reactions   Contrast Media [Iodinated Contrast Media] Shortness Of Breath and Other (See Comments)    Altered mental status     Ivp Dye [Iodinated Contrast Media] Anaphylaxis, Shortness Of Breath and Other (See Comments)    Altered mental status     Latex Rash    severe rash appears after the first 24 hours of being placed   Tape Rash and Other (See Comments)    Rash after 1 day of use  Do not leave on longer then 3 days without changing

## 2024-10-06 NOTE — Plan of Care (Signed)
  Problem: Education: Goal: Ability to describe self-care measures that may prevent or decrease complications (Diabetes Survival Skills Education) will improve Outcome: Not Progressing Goal: Individualized Educational Video(s) Outcome: Not Progressing   Problem: Coping: Goal: Ability to adjust to condition or change in health will improve Outcome: Not Progressing   Problem: Fluid Volume: Goal: Ability to maintain a balanced intake and output will improve Outcome: Not Progressing   Problem: Health Behavior/Discharge Planning: Goal: Ability to identify and utilize available resources and services will improve Outcome: Not Progressing Goal: Ability to manage health-related needs will improve Outcome: Not Progressing   Problem: Metabolic: Goal: Ability to maintain appropriate glucose levels will improve Outcome: Not Progressing   Problem: Nutritional: Goal: Maintenance of adequate nutrition will improve Outcome: Not Progressing Goal: Progress toward achieving an optimal weight will improve Outcome: Not Progressing   Problem: Skin Integrity: Goal: Risk for impaired skin integrity will decrease Outcome: Not Progressing   Problem: Tissue Perfusion: Goal: Adequacy of tissue perfusion will improve Outcome: Not Progressing   Problem: Activity: Goal: Ability to tolerate increased activity will improve Outcome: Not Progressing   Problem: Respiratory: Goal: Ability to maintain a clear airway and adequate ventilation will improve Outcome: Not Progressing   Problem: Role Relationship: Goal: Method of communication will improve Outcome: Not Progressing   Problem: Education: Goal: Knowledge of General Education information will improve Description: Including pain rating scale, medication(s)/side effects and non-pharmacologic comfort measures Outcome: Not Progressing   Problem: Health Behavior/Discharge Planning: Goal: Ability to manage health-related needs will  improve Outcome: Not Progressing   Problem: Clinical Measurements: Goal: Ability to maintain clinical measurements within normal limits will improve Outcome: Not Progressing Goal: Will remain free from infection Outcome: Not Progressing Goal: Diagnostic test results will improve Outcome: Not Progressing Goal: Respiratory complications will improve Outcome: Not Progressing Goal: Cardiovascular complication will be avoided Outcome: Not Progressing   Problem: Activity: Goal: Risk for activity intolerance will decrease Outcome: Not Progressing   Problem: Nutrition: Goal: Adequate nutrition will be maintained Outcome: Not Progressing   Problem: Coping: Goal: Level of anxiety will decrease Outcome: Not Progressing   Problem: Elimination: Goal: Will not experience complications related to bowel motility Outcome: Not Progressing Goal: Will not experience complications related to urinary retention Outcome: Not Progressing   Problem: Pain Managment: Goal: General experience of comfort will improve and/or be controlled Outcome: Not Progressing   Problem: Safety: Goal: Ability to remain free from injury will improve Outcome: Not Progressing   Problem: Skin Integrity: Goal: Risk for impaired skin integrity will decrease Outcome: Not Progressing

## 2024-10-06 NOTE — Progress Notes (Addendum)
 1830 spoke with MRI twice Rt now moving patient out of unit. MRI screening still not completed need to be done before bringing vented patient down

## 2024-10-06 NOTE — ED Provider Notes (Signed)
 " [place] EMERGENCY DEPARTMENT AT [facility] Provider Note   CSN: [id number] Arrival date & time: 10/06/24  9246     Patient presents with: Altered Mental Status   [name] is a 69 y.o. male.  Past medical history of coronary artery disease, heart failure, A-fib on anticoagulation, diabetes, obstructive sleep apnea, COPD, hypertension, hyperlipidemia, status post bilateral below the knee amputations, end-stage renal disease on dialysis, dry gangrene of left fourth digit.  Patient presents from [facility] nursing facility with EMS.  Patient was reportedly been very lethargic with no energy for the past 2 to 3 days.  When EMS initially evaluated patient he was found to be 80% on room air and subsequently placed on 4 L with improvement in oxygen  saturation, tachypneic, tachycardic and with systolic pressures in the 60s.  He has now been given a liter of LR and pressures are in the low 90s.  Seems to have improved mentation now that BP is up. Patient drowsy, confusion, following some commands, unsure why he is here, denies pain.   ESRD last on Saturday, 3 hour treatment. T/TH/Sat. Discharged from [facility] 09/25/24 for metabolic encephalopathy, sepsis secondary to PNA with bacteremia, hyponatremia, chronic afib on anticoagulation, debility.  D/C with PO Amoxicillin .   Family wishes from full code, full treatment.    Altered Mental Status      Prior to Admission medications  Medication Sig Start Date End Date Taking? Authorizing Provider  apixaban  (ELIQUIS ) 5 MG TABS tablet Take 1 tablet (5 mg total) by mouth 2 (two) times daily. 01/14/22 05/10/25 Yes Gonfa, Taye T, MD  atorvastatin  (LIPITOR ) 80 MG tablet Take 1 tablet (80 mg total) by mouth at bedtime. Patient taking differently: Take 80 mg by mouth every evening. 11/25/23  Yes Amoako, Prince, MD  carvedilol  (COREG ) 12.5 MG tablet Take 1 tablet (12.5 mg total) by mouth 2 (two) times daily with a meal. 09/25/24  Yes Adhikari, Ivonne, MD   colchicine  0.6 MG tablet Take 0.5 tablets (0.3 mg total) by mouth daily. Patient taking differently: Take 0.3 mg by mouth every evening. 02/21/24  Yes Kathrin Mignon DASEN, MD  CREAM BASE EX Apply 1 Application topically in the morning and at bedtime. Apply to incontinence area  PRN order: apply twice daily as needed for barrier cream   Yes [provider]  ethyl chloride spray Apply 1 Application topically See admin instructions. Apply to vascular access in the morning on Tuesday, Thursday, and Saturday. 09/14/24  Yes [provider]  famotidine  (PEPCID ) 20 MG tablet Take 20 mg by mouth at bedtime.   Yes [provider]  fentaNYL  (DURAGESIC ) 12 MCG/HR Place 1 patch onto the skin every 3 (three) days. 09/04/24  Yes Samtani, Jai-Gurmukh, MD  ferrous sulfate  325 (65 FE) MG tablet Take 325 mg by mouth every evening.   Yes [provider]  folic acid  (FOLVITE ) 1 MG tablet Take 1 mg by mouth every evening. 09/18/21  Yes [provider]  gabapentin  (NEURONTIN ) 100 MG capsule Take 100 mg by mouth 3 (three) times daily.   Yes [provider]  insulin  aspart (NOVOLOG  FLEXPEN) 100 UNIT/ML FlexPen Inject 4-12 Units into the skin See admin instructions. Inject 4 units subcutaneously three times daily before meals, in ADDITION to 0-12 units per sliding scale: 70 - 150 : 0 units 151-200 : 2 units 201-250 : 4 units 251-300 : 6 units 301-350 : 8 units 351-400 : 10 units 401-450 : 12 units > 400 :  give 12 units and recheck in 1 hour - if still > 400, call MD/NP for further orders.   Yes [provider]  oxyCODONE  (OXY IR/ROXICODONE ) 5 MG immediate release tablet Take 1 tablet (5 mg total) by mouth every 6 (six) hours as needed for moderate pain (pain score 4-6) or severe pain (pain score 7-10). 09/02/24  Yes Samtani, Jai-Gurmukh, MD  Phenylephrine  HCl (PREPARATION H) 0.25 % SUPP Place 1 suppository rectally every 6 (six) hours as needed (hemorrhoids).   Yes  [provider]  polyethylene glycol (MIRALAX  / GLYCOLAX ) 17 g packet Take 17 g by mouth daily as needed for moderate constipation. 09/02/24  Yes Samtani, Jai-Gurmukh, MD  rOPINIRole  (REQUIP ) 2 MG tablet Take 2 mg by mouth at bedtime.   Yes [provider]  senna (SENOKOT) 8.6 MG TABS tablet Take 17.2 mg by mouth every evening.   Yes [provider]  sevelamer  carbonate (RENVELA ) 800 MG tablet Take 1 tablet (800 mg total) by mouth 3 (three) times daily with meals. 09/02/24  Yes Samtani, Jai-Gurmukh, MD  tamsulosin  (FLOMAX ) 0.4 MG CAPS capsule Take 0.4 mg by mouth every evening.   Yes [provider]  amoxicillin  (AMOXIL ) 500 MG capsule Take 500 mg by mouth 2 (two) times daily. Patient not taking: Reported on 10/06/2024    [provider]    Allergies: Contrast media [iodinated contrast media], Ivp dye [iodinated contrast media], Latex, and Tape    Review of Systems  Constitutional:  Positive for activity change.    Updated Vital Signs BP (!) 94/41   Pulse 74   Temp (!) 100.5 F (38.1 C) (Rectal)   Resp (!) 23   SpO2 100%   Physical Exam Vitals and nursing note reviewed.  Constitutional:      General: He is not in acute distress.    Appearance: He is not toxic-appearing.  HENT:     Head: Normocephalic and atraumatic.  Eyes:     General: No scleral icterus.    Conjunctiva/sclera: Conjunctivae normal.  Cardiovascular:     Rate and Rhythm: Tachycardia present. Rhythm irregular.     Pulses: Normal pulses.     Heart sounds: Normal heart sounds.  Pulmonary:     Effort: Pulmonary effort is normal. No respiratory distress.     Breath sounds: Normal breath sounds.  Abdominal:     General: Abdomen is flat. Bowel sounds are normal.     Palpations: Abdomen is soft.     Tenderness: There is no abdominal tenderness.  Musculoskeletal:     Comments: Had fentanyl  patch on right shoulder which was subsequently removed due to AMS &  Hypotension.  Bilateral below the knee amputation.  Skin:    General: Skin is warm and dry.     Findings: No lesion.     Comments: Gangrene to left 4th finger with mild surrounding erythema  Neurological:     General: No focal deficit present.     Mental Status: He is alert and oriented to person, place, and time. Mental status is at baseline.  Psychiatric:     Comments: Confusion, mumbling.  Able to follow basic commands.  No obvious focal deficit on exam.     (all labs ordered are listed, but only abnormal results are displayed) Labs Reviewed  COMPREHENSIVE METABOLIC PANEL WITH GFR - Abnormal; Notable for the following components:      Result Value   Glucose, Bld 167 (*)    BUN 59 (*)    Creatinine,  Ser 8.04 (*)    Calcium  8.7 (*)    Total Protein 5.9 (*)    Albumin  3.3 (*)    Alkaline Phosphatase 128 (*)    GFR, Estimated 7 (*)    Anion gap 18 (*)    All other components within normal limits  CBC WITH DIFFERENTIAL/PLATELET - Abnormal; Notable for the following components:   RBC 3.44 (*)    Hemoglobin 10.1 (*)    HCT 33.0 (*)    RDW 20.8 (*)    All other components within normal limits  PROTIME-INR - Abnormal; Notable for the following components:   Prothrombin Time 31.5 (*)    INR 2.9 (*)    All other components within normal limits  PRO BRAIN NATRIURETIC PEPTIDE - Abnormal; Notable for the following components:   Pro Brain Natriuretic Peptide >35,000.0 (*)    All other components within normal limits  I-STAT CG4 LACTIC ACID, ED - Abnormal; Notable for the following components:   Lactic Acid, Venous 2.2 (*)    All other components within normal limits  I-STAT CHEM 8, ED - Abnormal; Notable for the following components:   BUN 54 (*)    Creatinine, Ser 8.40 (*)    Glucose, Bld 160 (*)    Calcium , Ion 1.03 (*)    Hemoglobin 10.5 (*)    HCT 31.0 (*)    All other components within normal limits  CULTURE, BLOOD (ROUTINE X 2)  CULTURE, BLOOD (ROUTINE X 2)  RESP  PANEL BY RT-PCR (RSV, FLU A&B, COVID)  RVPGX2  URINALYSIS, W/ REFLEX TO CULTURE (INFECTION SUSPECTED)  AMMONIA  I-STAT CG4 LACTIC ACID, ED  I-STAT VENOUS BLOOD GAS, ED    EKG: EKG Interpretation Date/Time:  Tuesday October 06 2024 08:28:20 EST Ventricular Rate:  87 PR Interval:    QRS Duration:  183 QT Interval:  449 QTC Calculation: 541 R Axis:   -76  Text Interpretation: Atrial fibrillation RBBB and LAFB Abnormal T, consider ischemia, lateral leads Confirmed by Laurice Coy [phone]) on 10/06/2024 10:15:13 AM  Radiology: [facility] Hand Complete Left Result Date: 10/06/2024 EXAM: 3 OR MORE VIEW(S) XRAY OF THE LEFT HAND 10/06/2024 08:48:00 AM COMPARISON: 08/31/2024 CLINICAL HISTORY: 69 year old male with a wound. FINDINGS: BONES AND JOINTS: Limited evaluation of the digits because of flexion on all views. Mild degenerative changes. No acute fracture. No malalignment. SOFT TISSUES: 3 mm metallic density along the dorsal medial aspect of the wrist, chronic and stable retained foreign body. Extensive vascular calcifications. Generalized soft tissue swelling at the proximal hand and wrist has increased since last month. IMPRESSION: 1. No acute osseous abnormality identified about the left hand. 2. Generalized proximal hand and wrist soft tissue swelling is new or increased. 3. Very severe calcified peripheral vascular disease. Electronically signed by: Helayne Hurst MD 10/06/2024 09:08 AM EST RP Workstation: [id number]   DG Hand 2 View Right Result Date: 10/06/2024 EXAM: 1 or 2 VIEW(S) XRAY OF THE RIGHT HAND 10/06/2024 08:48:00 AM COMPARISON: Right hand series 08/31/2024. CLINICAL HISTORY: 69 year old male with an extremity wound. FINDINGS: BONES AND JOINTS: No acute fracture. No malalignment. Moderate first CMC degeneration. Mild first MCP and IP osteoarthritis. SOFT TISSUES: Pulse oximeter artifact over the right 2nd finger. Incidental intravenous access artifact at the wrist. Vascular calcifications.  IMPRESSION: 1. No acute osseous abnormality identified about the right hand. 2. Very severe calcified peripheral vascular disease. Electronically signed by: Helayne Hurst MD 10/06/2024 09:06 AM EST RP Workstation: [id number]   DG Chest Port 1 View Result  Date: 10/06/2024 EXAM: 1 VIEW(S) XRAY OF THE CHEST 10/06/2024 08:48:00 AM COMPARISON: 09/18/2024 CLINICAL HISTORY: 69 year old male with query sepsis, increasing confusion, and lethargy for 3 days. Evaluate for abnormality. FINDINGS: LUNGS AND PLEURA: Ongoing and mildly progressed dense left lung base opacification obscuring the left hemidiaphragm. This may all be pleural effusion, but a combination of consolidation and pleural effusion is possible. Stable nearby linear platelike atelectasis. Streaky right lung base opacity also appears increased. No pneumothorax. HEART AND MEDIASTINUM: Cardiomegaly. Calcified aorta. BONES AND SOFT TISSUES: Cervical fusion hardware noted. No acute osseous abnormality. ABDOMEN: Paucity of visible bowel gas in the abdomen. IMPRESSION: 1. Increasing dense left lung base opacity, likely in part due to pleural effusion, but also suspicious for pneumonia. Increasing streaky right lung base opacity, atelectasis vs infection. 2. Cardiomegaly. Electronically signed by: Helayne Hurst MD 10/06/2024 09:05 AM EST RP Workstation: [id number]     .Critical Care  Performed by: Shermon Warren SAILOR, PA-C Authorized by: Shermon Warren SAILOR, PA-C   Critical care provider statement:    Critical care time (minutes):  45   Critical care was necessary to treat or prevent imminent or life-threatening deterioration of the following conditions:  Sepsis and respiratory failure   Critical care was time spent personally by me on the following activities:  Development of treatment plan with patient or surrogate, discussions with consultants, evaluation of patient's response to treatment, examination of patient, ordering and review of laboratory studies,  ordering and review of radiographic studies, ordering and performing treatments and interventions, pulse oximetry, re-evaluation of patient's condition and review of old charts   Care discussed with: admitting provider      Medications Ordered in the ED  vancomycin  (VANCOREADY) IVPB 2000 mg/400 mL (2,000 mg Intravenous New Bag/Given 10/06/24 0859)  sodium chloride  0.9 % bolus 500 mL (500 mLs Intravenous New Bag/Given 10/06/24 0858)  ceFEPIme  (MAXIPIME ) 1 g in sodium chloride  0.9 % 100 mL IVPB (0 g Intravenous Stopped 10/06/24 0930)    Clinical Course as of 10/06/24 1023  Tue Oct 06, 2024  1017 Discuss with critical care who will come evaluate patient.  [JB]    Clinical Course User Index [JB] [facility], Warren SAILOR, PA-C                                 Medical Decision Making Amount and/or Complexity of Data Reviewed Labs: ordered. Radiology: ordered.  Risk Prescription drug management. Decision regarding hospitalization.   This patient presents to the ED for concern of AMS/fever, this involves an extensive number of treatment options, and is a complaint that carries with it a high risk of complications and morbidity.  The differential diagnosis includes cardiogenic shock, hypovolemic shock, sepsis, pneumonia, ACS, PE, pneumothorax, UTI, dehydration, electrolyte abnormality, hepatic encephalopathy   Co morbidities that complicate the patient evaluation  Coronary artery disease, heart failure, A-fib on anticoagulation, diabetes, obstructive sleep apnea, COPD, hypertension, hyperlipidemia, status post bilateral below the knee amputations, end-stage renal disease on dialysis, dry gangrene of left fourth digit.   Additional history obtained:  Additional history obtained from patient was recently admitted and discharged for sepsis bacteremia due to pneumonia on the 16th of this month Last echo was 08/26/2024 with an EF of 50 to 55% Blood cultures positive for Enterobacterales and Proteus on  09/19/23 x 2   Lab Tests:  I personally interpreted labs.  The pertinent results include:   CBC shows no leukocytosis, and baseline  anemia.  Patient is end-stage renal disease on dialysis -creatinine is 8.  Potassium is within normal limits. Blood cultures are pending.  Lactic initially 2.2   Imaging Studies ordered:  I ordered imaging studies including x-rays of bilateral hands due to chronic appearing wounds, chest x-ray I independently visualized and interpreted imaging which showed chest x-ray showing increased density of the left lung base and right lung base suspicious for infection. I agree with the radiologist interpretation   Cardiac Monitoring: / EKG:  The patient was maintained on a cardiac monitor.  I personally viewed and interpreted the cardiac monitored which showed an underlying rhythm of: EKG is showing atrial fibrillation with a rate of 80's   Consultations Obtained:  I requested consultation with the critical care,  and discussed lab and imaging findings as well as pertinent plan - they recommend: Intubated by critical care, critical care have seen the patient and will admit.    Problem List / ED Course / Critical interventions / Medication management  Patient comes into emergency room and found to have fever, hypotension, tachycardia and tachypnea concerning for sepsis. Patient placed on 4L [place] with EMS and sats well here. No resp distress. Code sepsis was ordered and subsequently given Vanco, cefepime  and fluids.  Blood cultures lactic and labs are pending.  EKG is ordered.  Obtaining chest x-ray as well as x-ray of the right and left hand. Chest x-ray is concerning for pneumonia.  He does not have white count but he is mildly hypotensive, tachypneic and has low-grade fever.  Given meeting sepsis criteria he was started on fluids and Vanco and cefepime .  He initially had improvement of blood pressure after fluids and had improved mentation following commands however  blood pressure started downtrending again maintaining maps of low 60s. Additional 500cc ordered. I have consulted with critical care to come see the patient.  Also, patient found to have fentanyl  patch on right shoulder which was removed.  This may be contributing to altered mental status/hypotension however he is supporting his airway, no respiratory depression at this time so will hold off on Narcan .  I ordered medication including NS, Vanco, Cefepime .  Reevaluation of the patient after these medicines showed that the patient stayed the same I have reviewed the patients home medicines and have made adjustments as needed        Final diagnoses:  Sepsis without acute organ dysfunction, due to unspecified organism (HCC)  HCAP (healthcare-associated pneumonia)  Metabolic encephalopathy    ED Discharge Orders     None          Shermon Warren LOISE DEVONNA 10/06/24 1242    Laurice Maude BROCKS, MD 10/09/24 1412  "

## 2024-10-06 NOTE — Sepsis Progress Note (Signed)
 Sepsis protocol is being followed by [facility].

## 2024-10-07 ENCOUNTER — Inpatient Hospital Stay (HOSPITAL_COMMUNITY)

## 2024-10-07 ENCOUNTER — Encounter: Admitting: Orthopedic Surgery

## 2024-10-07 DIAGNOSIS — Z89022 Acquired absence of left finger(s): Secondary | ICD-10-CM | POA: Diagnosis not present

## 2024-10-07 DIAGNOSIS — E872 Acidosis, unspecified: Secondary | ICD-10-CM

## 2024-10-07 DIAGNOSIS — T426X4A Poisoning by other antiepileptic and sedative-hypnotic drugs, undetermined, initial encounter: Secondary | ICD-10-CM | POA: Diagnosis not present

## 2024-10-07 DIAGNOSIS — A419 Sepsis, unspecified organism: Secondary | ICD-10-CM

## 2024-10-07 DIAGNOSIS — I132 Hypertensive heart and chronic kidney disease with heart failure and with stage 5 chronic kidney disease, or end stage renal disease: Secondary | ICD-10-CM | POA: Diagnosis not present

## 2024-10-07 DIAGNOSIS — J189 Pneumonia, unspecified organism: Secondary | ICD-10-CM | POA: Diagnosis not present

## 2024-10-07 DIAGNOSIS — E440 Moderate protein-calorie malnutrition: Secondary | ICD-10-CM | POA: Insufficient documentation

## 2024-10-07 DIAGNOSIS — E1122 Type 2 diabetes mellitus with diabetic chronic kidney disease: Secondary | ICD-10-CM | POA: Diagnosis not present

## 2024-10-07 DIAGNOSIS — G8929 Other chronic pain: Secondary | ICD-10-CM | POA: Diagnosis not present

## 2024-10-07 DIAGNOSIS — Z794 Long term (current) use of insulin: Secondary | ICD-10-CM | POA: Diagnosis not present

## 2024-10-07 DIAGNOSIS — R569 Unspecified convulsions: Secondary | ICD-10-CM | POA: Diagnosis not present

## 2024-10-07 DIAGNOSIS — Z992 Dependence on renal dialysis: Secondary | ICD-10-CM | POA: Diagnosis not present

## 2024-10-07 DIAGNOSIS — I5020 Unspecified systolic (congestive) heart failure: Secondary | ICD-10-CM | POA: Diagnosis not present

## 2024-10-07 DIAGNOSIS — N186 End stage renal disease: Secondary | ICD-10-CM | POA: Diagnosis not present

## 2024-10-07 DIAGNOSIS — E875 Hyperkalemia: Secondary | ICD-10-CM

## 2024-10-07 DIAGNOSIS — I509 Heart failure, unspecified: Secondary | ICD-10-CM | POA: Diagnosis not present

## 2024-10-07 LAB — BLOOD CULTURE ID PANEL (REFLEXED) - BCID2

## 2024-10-07 LAB — CBC
HCT: 39.2 % (ref 39.0–52.0)
Hemoglobin: 12.4 g/dL — ABNORMAL LOW (ref 13.0–17.0)
MCH: 29.5 pg (ref 26.0–34.0)
MCHC: 31.6 g/dL (ref 30.0–36.0)
MCV: 93.1 fL (ref 80.0–100.0)
Platelets: 167 10*3/uL (ref 150–400)
RBC: 4.21 MIL/uL — ABNORMAL LOW (ref 4.22–5.81)
RDW: 21.1 % — ABNORMAL HIGH (ref 11.5–15.5)
WBC: 9.5 10*3/uL (ref 4.0–10.5)
nRBC: 0 % (ref 0.0–0.2)

## 2024-10-07 LAB — RENAL FUNCTION PANEL
Albumin: 3.3 g/dL — ABNORMAL LOW (ref 3.5–5.0)
Anion gap: 26 — ABNORMAL HIGH (ref 5–15)
BUN: 70 mg/dL — ABNORMAL HIGH (ref 8–23)
CO2: 19 mmol/L — ABNORMAL LOW (ref 22–32)
Calcium: 8.7 mg/dL — ABNORMAL LOW (ref 8.9–10.3)
Chloride: 96 mmol/L — ABNORMAL LOW (ref 98–111)
Creatinine, Ser: 8.23 mg/dL — ABNORMAL HIGH (ref 0.61–1.24)
GFR, Estimated: 6 mL/min — ABNORMAL LOW
Glucose, Bld: 170 mg/dL — ABNORMAL HIGH (ref 70–99)
Phosphorus: 5.4 mg/dL — ABNORMAL HIGH (ref 2.5–4.6)
Potassium: 5.6 mmol/L — ABNORMAL HIGH (ref 3.5–5.1)
Sodium: 141 mmol/L (ref 135–145)

## 2024-10-07 LAB — GLUCOSE, CAPILLARY
Glucose-Capillary: 135 mg/dL — ABNORMAL HIGH (ref 70–99)
Glucose-Capillary: 139 mg/dL — ABNORMAL HIGH (ref 70–99)
Glucose-Capillary: 146 mg/dL — ABNORMAL HIGH (ref 70–99)
Glucose-Capillary: 153 mg/dL — ABNORMAL HIGH (ref 70–99)
Glucose-Capillary: 160 mg/dL — ABNORMAL HIGH (ref 70–99)
Glucose-Capillary: 164 mg/dL — ABNORMAL HIGH (ref 70–99)

## 2024-10-07 LAB — APTT
aPTT: 35 s (ref 24–36)
aPTT: 60 s — ABNORMAL HIGH (ref 24–36)

## 2024-10-07 LAB — HEPARIN LEVEL (UNFRACTIONATED)
Heparin Unfractionated: >1.1 [IU]/mL — ABNORMAL HIGH (ref 0.30–0.70)
Heparin Unfractionated: >1.1 [IU]/mL — ABNORMAL HIGH (ref 0.30–0.70)

## 2024-10-07 LAB — HEPATITIS B SURFACE ANTIBODY, QUANTITATIVE: Hep B S AB Quant (Post): 42475 m[IU]/mL

## 2024-10-07 MED ORDER — HEPARIN SODIUM (PORCINE) 1000 UNIT/ML DIALYSIS: 1000.0000 [IU] | INTRAMUSCULAR | Status: DC | PRN

## 2024-10-07 MED ORDER — JUVEN PO PACK: 1.0000 | PACK | Freq: Two times a day (BID) | ORAL | Status: DC

## 2024-10-07 MED ORDER — LIDOCAINE HCL (PF) 1 % IJ SOLN: 5.0000 mL | INTRAMUSCULAR | Status: DC | PRN

## 2024-10-07 MED ORDER — FENTANYL CITRATE (PF) 50 MCG/ML IJ SOSY: 25.0000 ug | PREFILLED_SYRINGE | INTRAMUSCULAR | Status: DC | PRN

## 2024-10-07 MED ORDER — ANTICOAGULANT SODIUM CITRATE 4% (200MG/5ML) IV SOLN: 5.0000 mL | Status: DC | PRN

## 2024-10-07 MED ORDER — PROSOURCE TF20 ENFIT COMPATIBL EN LIQD: 60.0000 mL | Freq: Every day | ENTERAL | Status: DC

## 2024-10-07 MED ORDER — CHLORHEXIDINE GLUCONATE CLOTH 2 % EX PADS
6.0000 | MEDICATED_PAD | Freq: Every day | CUTANEOUS | Status: AC
  Administered 2024-10-07 – 2024-10-12 (×6): 6 via TOPICAL

## 2024-10-07 MED ORDER — LIDOCAINE-PRILOCAINE 2.5-2.5 % EX CREA: 1.0000 | TOPICAL_CREAM | CUTANEOUS | Status: DC | PRN

## 2024-10-07 MED ORDER — FENTANYL CITRATE (PF) 50 MCG/ML IJ SOSY
25.0000 ug | PREFILLED_SYRINGE | INTRAMUSCULAR | Status: DC | PRN
  Administered 2024-10-07: 25 ug via INTRAVENOUS
  Filled 2024-10-07: qty 1

## 2024-10-07 MED ORDER — VANCOMYCIN VARIABLE DOSE PER UNSTABLE RENAL FUNCTION (PHARMACIST DOSING): Status: DC

## 2024-10-07 MED ORDER — RENA-VITE PO TABS
1.0000 | ORAL_TABLET | Freq: Every day | ORAL | Status: DC
  Filled 2024-10-07: qty 1

## 2024-10-07 MED ORDER — ALTEPLASE 2 MG IJ SOLR: 2.0000 mg | Freq: Once | INTRAMUSCULAR | Status: DC | PRN

## 2024-10-07 MED ORDER — VANCOMYCIN HCL IN DEXTROSE 1-5 GM/200ML-% IV SOLN
1000.0000 mg | Freq: Once | INTRAVENOUS | Status: AC
  Administered 2024-10-07: 1000 mg via INTRAVENOUS
  Filled 2024-10-07: qty 200

## 2024-10-07 MED ORDER — NEPRO/CARBSTEADY PO LIQD: 1000.0000 mL | ORAL | Status: DC

## 2024-10-07 MED ORDER — PENTAFLUOROPROP-TETRAFLUOROETH EX AERO: 1.0000 | INHALATION_SPRAY | CUTANEOUS | Status: DC | PRN

## 2024-10-07 NOTE — Procedures (Signed)
 Extubation Procedure Note  Patient Details:   Name: [name] DOB: January 06, 1956 MRN: [id number]   Airway Documentation:    Vent end date: 10/07/24 Vent end time: 1503   Evaluation  O2 sats: stable throughout Complications: No apparent complications Patient did tolerate procedure well. Bilateral Breath Sounds: Clear, Diminished   Yes  Positive cuff leak noted. Patient placed on [place] 4L with humidity, no stridor noted. Patient not following commands for the incentive spirometer at this time. RN notified.  Cy GORMAN Pan 10/07/2024, 4:55 PM

## 2024-10-07 NOTE — Progress Notes (Signed)
" °   10/07/24 0900  Vitals  Temp 97.9 F (36.6 C)  Pulse Rate 88  Resp 18  BP 135/88  SpO2 100 %  O2 Device Ventilator  Weight 97.9 kg  Type of Weight Post-Dialysis  Oxygen  Therapy  FiO2 (%) 40 %  Patient Activity (if Appropriate) In bed  Oximetry Probe Site Changed No  Post Treatment  Dialyzer Clearance Clear  Liters Processed 84  Fluid Removed (mL) 0 mL  Tolerated HD Treatment Yes  Post-Hemodialysis Comments Patient tolerated HD well. BP and pulse stable. No issues w/ LUA AVF.  AVG/AVF Arterial Site Held (minutes) 8 minutes  AVG/AVF Venous Site Held (minutes) 8 minutes    "

## 2024-10-07 NOTE — Plan of Care (Signed)
   Problem: Education: Goal: Ability to describe self-care measures that may prevent or decrease complications (Diabetes Survival Skills Education) will improve Outcome: Progressing Goal: Individualized Educational Video(s) Outcome: Progressing   Problem: Coping: Goal: Ability to adjust to condition or change in health will improve Outcome: Progressing   Problem: Fluid Volume: Goal: Ability to maintain a balanced intake and output will improve Outcome: Progressing   Problem: Health Behavior/Discharge Planning: Goal: Ability to identify and utilize available resources and services will improve Outcome: Progressing Goal: Ability to manage health-related needs will improve Outcome: Progressing   Problem: Metabolic: Goal: Ability to maintain appropriate glucose levels will improve Outcome: Progressing   Problem: Nutritional: Goal: Maintenance of adequate nutrition will improve Outcome: Progressing Goal: Progress toward achieving an optimal weight will improve Outcome: Progressing   Problem: Skin Integrity: Goal: Risk for impaired skin integrity will decrease Outcome: Progressing   Problem: Tissue Perfusion: Goal: Adequacy of tissue perfusion will improve Outcome: Progressing   Problem: Activity: Goal: Ability to tolerate increased activity will improve Outcome: Progressing   Problem: Respiratory: Goal: Ability to maintain a clear airway and adequate ventilation will improve Outcome: Progressing   Problem: Role Relationship: Goal: Method of communication will improve Outcome: Progressing   Problem: Education: Goal: Knowledge of General Education information will improve Description: Including pain rating scale, medication(s)/side effects and non-pharmacologic comfort measures Outcome: Progressing   Problem: Health Behavior/Discharge Planning: Goal: Ability to manage health-related needs will improve Outcome: Progressing   Problem: Clinical Measurements: Goal:  Ability to maintain clinical measurements within normal limits will improve Outcome: Progressing Goal: Will remain free from infection Outcome: Progressing Goal: Diagnostic test results will improve Outcome: Progressing Goal: Respiratory complications will improve Outcome: Progressing Goal: Cardiovascular complication will be avoided Outcome: Progressing   Problem: Activity: Goal: Risk for activity intolerance will decrease Outcome: Progressing   Problem: Nutrition: Goal: Adequate nutrition will be maintained Outcome: Progressing   Problem: Coping: Goal: Level of anxiety will decrease Outcome: Progressing   Problem: Elimination: Goal: Will not experience complications related to bowel motility Outcome: Progressing Goal: Will not experience complications related to urinary retention Outcome: Progressing   Problem: Pain Managment: Goal: General experience of comfort will improve and/or be controlled Outcome: Progressing   Problem: Safety: Goal: Ability to remain free from injury will improve Outcome: Progressing   Problem: Skin Integrity: Goal: Risk for impaired skin integrity will decrease Outcome: Progressing

## 2024-10-07 NOTE — Progress Notes (Signed)
 NEUROLOGY CONSULT FOLLOW UP NOTE   Date of service: October 07, 2024 Patient Name: [name] MRN:  [id number] DOB:  [birth year]  Interval Hx/subjective  Now slightly less obtunded.    Vitals   Vitals:   10/07/24 1130 10/07/24 1150 10/07/24 1228 10/07/24 1235  BP: (!) 99/56  (!) 100/58 (!) 92/56  Pulse: 87  84 85  Resp: (!) 21  (!) 21 (!) 22  Temp:  98.9 F (37.2 C)    TempSrc:  Oral    SpO2: 100%  100% 100%  Weight:      Height:         Body mass index is 27.71 kg/m.  Physical Exam   Constitutional: Chronically ill-appearing elderly patient in no acute distress Eyes: No scleral injection.  HENT: No OP obstruction.  Head: Normocephalic.  Cardiovascular: A-fib on the monitor Respiratory: Somewhat labored respirations on supplemental O2 Skin: Gangrene to fourth finger of left hand   Neurologic Examination   Mental Status: Patient rests with eyes closed initially. Will open eyes to voice and touch. Will weakly move BUE to stimulation. Does not coherently answer questions. Minimal vocalization.  Cranial Nerves:  II: PERRL.  III, IV, VI: Eyes are conjugate VII: No definite facial droop XII: Noncooperative with tongue protrusion Motor: Moves bilateral upper extremities spontaneously and not purposefully with antigravity strength, moves bilateral lower extremities but does not lift them off the bed, myoclonic jerking and asterixis-like movements noted in upper extremities are intermittently seen, improved since yesterday. The movements are asynchronous Sensation-will grimace to noxious stimuli in all 4 extremities Coordination/Gait: Unable to assess  Medications Current Medications[1]  Labs and Diagnostic Imaging   CBC:  Recent Labs  Lab 10/06/24 0809 10/06/24 0845 10/06/24 1323 10/07/24 0324  WBC 9.5  --   --  9.5  NEUTROABS 7.4  --   --   --   HGB 10.1*   < > 11.2* 12.4*  HCT 33.0*   < > 33.0* 39.2  MCV 95.9  --   --  93.1  PLT 164  --   --  167   < > =  values in this interval not displayed.    Basic Metabolic Panel:  Lab Results  Component Value Date   NA 141 10/07/2024   K 5.6 (H) 10/07/2024   CO2 19 (L) 10/07/2024   GLUCOSE 170 (H) 10/07/2024   BUN 70 (H) 10/07/2024   CREATININE 8.23 (H) 10/07/2024   CALCIUM  8.7 (L) 10/07/2024   GFRNONAA 6 (L) 10/07/2024   GFRAA 16 (L) 05/30/2020   Lipid Panel:  Lab Results  Component Value Date   LDLCALC 42 08/27/2024   HgbA1c:  Lab Results  Component Value Date   HGBA1C 6.9 (H) 08/28/2024   Urine Drug Screen:     Component Value Date/Time   LABOPIA NEGATIVE 08/26/2024 1303   COCAINSCRNUR NEGATIVE 08/26/2024 1303   LABBENZ NEGATIVE 08/26/2024 1303   AMPHETMU NEGATIVE 08/26/2024 1303   THCU NEGATIVE 08/26/2024 1303   LABBARB NEGATIVE 08/26/2024 1303    Alcohol  Level     Component Value Date/Time   ETH <10 05/28/2023 1243   INR  Lab Results  Component Value Date   INR 2.9 (H) 10/06/2024   APTT  Lab Results  Component Value Date   APTT 35 10/07/2024   AED levels: No results found for: PHENYTOIN, ZONISAMIDE, LAMOTRIGINE, LEVETIRACETA  MRI HEAD:  1. No acute intracranial abnormality. 2. 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 3. Underlying age-related cerebral atrophy with mild chronic small vessel ischemic disease.   MRA HEAD:  1. 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 Mild retrograde filling of the distal left V4 segment via the vertebrobasilar junction. Left PICA not well seen on this exam. 2. Otherwise negative intracranial MRA. No other large vessel occlusion or hemodynamically significant stenosis.   MRA NECK:  1. No acute abnormality about the major arterial vasculature of the neck. 2. Atheromatous irregularity  about the carotid bifurcations without hemodynamically significant greater than 50% stenosis. 3. Both vertebral arteries patent within the neck. Right vertebral artery dominant.   Assessment  [name] is a 69 y.o. male with hx of A-fib on Eliquis , end-stage renal disease on dialysis, diabetes, hypertension, aortic stenosis, chronic pain, CHF, left hemispheric TIAs, left CEA, carotid stent, cardiac stent and bilateral below-knee amputations who presents from his facility with a 2 to 3-day history of increased lethargy.  He was found to be hypotensive on admission with 2 fentanyl  patches in place.  Fentanyl  patches were removed, and Narcan  was administered with some improvement in responsiveness and blood pressure.  Myoclonic jerking of bilateral upper and lower extremities was appreciated by CCM after Narcan  administration and Neurology was consulted. Of note, the patient does take gabapentin  at his facility. - On exam, he is less obtunded than yesterday. Intermittent asterixis vs myoclonus is improved, but still present.   - Imaging results as documented above. No acute stroke seen.   - LTM EEG report for today: Continuous slow, generalized. This study is suggestive of severe diffuse encephalopathy, nonspecific etiology but likely related to sedation. No seizures or epileptiform discharges were seen throughout the recording. - Impression: Suspect that his acute onset movement disorder with AMS is due to gabapentin  toxicity in the setting of his ESRD  Recommendations  - Discontinuing LTM EEG -- Continue off Neurontin . Stay off this medication indefinitely - Neurology will continue to follow  ______________________________________________________________________   Bonney SHARK, Brendt Dible, MD [facility]     [1]  Current Facility-Administered Medications:    alteplase  (CATHFLO ACTIVASE ) injection 2 mg, 2 mg, Intracatheter, Once PRN, Gerome, Samantha G, PA-C   anticoagulant  sodium citrate  solution 5 mL, 5 mL, Intracatheter, PRN, Collins, Samantha G, PA-C   atorvastatin  (LIPITOR ) tablet 80 mg, 80 mg, Per Tube, Daily, Antonetta Moccasin B, NP, 80 mg at 10/07/24 9056   Chlorhexidine  Gluconate Cloth 2 % PADS 6 each, 6 each, Topical, Daily, Antonetta Moccasin B, NP, 6 each at 10/07/24 9056   Chlorhexidine  Gluconate Cloth 2 % PADS 6 each, 6 each, Topical, Q0600, Melia Lynwood ORN, MD   dexmedetomidine  (PRECEDEX ) 400 MCG/100ML (4 mcg/mL) infusion, 0-1.2 mcg/kg/hr, Intravenous, Continuous, Antonetta Moccasin B, NP, Last Rate: 19.67 mL/hr at 10/07/24 1233, 0.7 mcg/kg/hr at 10/07/24 1233   fentaNYL  (SUBLIMAZE ) injection 50 mcg, 50 mcg, Intravenous, Q15 min PRN, Antonetta Moccasin B, NP   fentaNYL  (SUBLIMAZE ) injection 50-200 mcg, 50-200 mcg, Intravenous, Q30 min PRN, Antonetta Moccasin B, NP, 100 mcg at 10/06/24 1239   heparin  ADULT infusion 100 units/mL (25000 units/250mL), 1,450 Units/hr, Intravenous, Continuous, Clair Lynwood LITTIE, RPH, Last Rate: 14.5 mL/hr at 10/07/24 1200, 1,450 Units/hr at 10/07/24 1200   heparin  injection 1,000 Units, 1,000 Units, Intracatheter, PRN, Collins, Samantha G, PA-C   ipratropium-albuterol  (DUONEB) 0.5-2.5 (3) MG/3ML nebulizer solution 3  mL, 3 mL, Nebulization, Q4H, Simpson, Paula B, NP, 3 mL at 10/07/24 1127   lactulose  (CHRONULAC ) 10 GM/15ML solution 10 g, 10 g, Per Tube, BID, Antonetta Moccasin B, NP, 10 g at 10/07/24 9056   lidocaine  (PF) (XYLOCAINE ) 1 % injection 5 mL, 5 mL, Intradermal, PRN, Collins, Samantha G, PA-C   lidocaine -prilocaine  (EMLA ) cream 1 Application, 1 Application, Topical, PRN, Collins, Samantha G, PA-C   mupirocin  ointment (BACTROBAN ) 2 %, , Nasal, BID, Antonetta Moccasin B, NP, Given at 10/07/24 [phone]   Oral care mouth rinse, 15 mL, Mouth Rinse, Q2H, Simpson, Paula B, NP, 15 mL at 10/07/24 1154   Oral care mouth rinse, 15 mL, Mouth Rinse, PRN, Antonetta Moccasin B, NP   pantoprazole  (PROTONIX ) injection 40 mg, 40 mg, Intravenous, Daily, Antonetta Moccasin B, NP,  40 mg at 10/07/24 9056   pentafluoroprop-tetrafluoroeth (GEBAUERS) aerosol 1 Application, 1 Application, Topical, PRN, Collins, Samantha G, PA-C   piperacillin -tazobactam (ZOSYN ) IVPB 2.25 g, 2.25 g, Intravenous, Q8H, Hussein, Lenny, MD, Stopped at 10/06/24 2232   vancomycin  (VANCOCIN ) IVPB 1000 mg/200 mL premix, 1,000 mg, Intravenous, Once, Tanda Powell ORN, RPH, Last Rate: 200 mL/hr at 10/07/24 1200, Infusion Verify at 10/07/24 1200   vancomycin  variable dose per unstable renal function (pharmacist dosing), , Does not apply, See admin instructions, Tanda Powell ORN, [facility]

## 2024-10-07 NOTE — Progress Notes (Signed)
 LTM maint complete - no skin breakdown seen.  Switched out presenter, broadcasting. All leads in place. [facility] monitored, Event button test confirmed by [facility].

## 2024-10-07 NOTE — Progress Notes (Addendum)
 PHARMACY - ANTICOAGULATION CONSULT NOTE  Pharmacy Consult for heparin  Indication: atrial fibrillation  Allergies[1]  Patient Measurements: Height: 6' 2 (188 cm) Weight: 97.9 kg (215 lb 13.3 oz) IBW/kg (Calculated) : 82.2 HEPARIN  DW (KG): 89.5  Vital Signs: Temp: 100.1 F (37.8 C) (01/28 1554) Temp Source: Oral (01/28 1554) BP: 105/64 (01/28 1500) Pulse Rate: 92 (01/28 1500)  Labs: Recent Labs    10/06/24 0809 10/06/24 0845 10/06/24 1215 10/06/24 1323 10/06/24 1455 10/07/24 0324 10/07/24 1443  HGB 10.1* 10.5* 11.6* 11.2*  --  12.4*  --   HCT 33.0* 31.0* 34.0* 33.0*  --  39.2  --   PLT 164  --   --   --   --  167  --   APTT  --   --   --   --   --  35  --   LABPROT 31.5*  --   --   --   --   --   --   INR 2.9*  --   --   --   --   --   --   HEPARINUNFRC  --   --   --   --   --  >1.10* >1.10*  CREATININE 8.04* 8.40*  --   --  7.57* 8.23*  --     Estimated Creatinine Clearance: 9.7 mL/min (A) (by C-G formula based on SCr of 8.23 mg/dL (H)).   Medical History: Past Medical History:  Diagnosis Date   Carotid artery occlusion 11/10/2010   LEFT CAROTID ENDARTERECTOMY   Complication of anesthesia    BP WENT UP AT [facility]    COPD (chronic obstructive pulmonary disease) (HCC)    pt denies this dx as of 06/01/20 - no inhaler    Diabetes mellitus without complication (HCC)    Diverticulitis    Diverticulosis of colon (without mention of hemorrhage)    DJD (degenerative joint disease)    knees/hands/feet/back/neck   ESRD (end stage renal disease) on dialysis (HCC)    Fatty liver    Full dentures    GERD (gastroesophageal reflux disease)    H/O hiatal hernia    History of blood transfusion    with a past surical procedure per patient 06/01/20   History of right below knee amputation (HCC) 07/12/2016   Hyperlipidemia    Hypertension    Neuromuscular disorder (HCC)    peripheral neuropathy   Non-pressure chronic ulcer of other part of left foot limited to breakdown of  skin (HCC) 11/12/2016   Nonrheumatic aortic (valve) stenosis 03/18/2024   Limited TTE 03/18/2024: EF 55-60, moderate LVH, normal RVSF, moderate LAE, mild MR, mild-moderate AS (mean 12, V-max 235 cm/s, DI 0.35), aortic root 39 mm, moderate pleural effusion, trivial pericardial effusion    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 Atherectomy left popliteal artery July 2012.    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 60 tablet 1 10/05/2024 at  8:00 PM   atorvastatin  (LIPITOR ) 80 MG tablet Take 1 tablet (80 mg total) by mouth at bedtime. (Patient taking differently: Take 80 mg by mouth every evening.)   10/05/2024   carvedilol  (COREG ) 12.5 MG tablet Take 1 tablet (12.5 mg total) by mouth 2 (two) times daily with a meal.   10/05/2024   colchicine  0.6 MG tablet Take 0.5 tablets (0.3 mg total) by mouth daily. (Patient taking differently: Take 0.3 mg by mouth every evening.)   10/05/2024   CREAM BASE EX Apply 1 Application topically in the morning and at bedtime. Apply to incontinence area  PRN order: apply twice daily as needed for barrier cream   10/05/2024   ethyl chloride spray Apply 1 Application topically See admin instructions. Apply to vascular access in the morning on Tuesday, Thursday, and Saturday.   10/03/2024   famotidine  (PEPCID ) 20 MG tablet Take 20 mg by mouth at bedtime.   10/05/2024   fentaNYL  (DURAGESIC ) 12 MCG/HR Place 1 patch onto the skin every 3  (three) days. 5 patch 0 10/05/2024   ferrous sulfate  325 (65 FE) MG tablet Take 325 mg by mouth every evening.   10/05/2024   folic acid  (FOLVITE ) 1 MG tablet Take 1 mg by mouth every evening.   10/05/2024   gabapentin  (NEURONTIN ) 100 MG capsule Take 100 mg by mouth 3 (three) times daily.   10/05/2024   insulin  aspart (NOVOLOG  FLEXPEN) 100 UNIT/ML FlexPen Inject 4-12 Units into the skin See admin instructions. Inject 4 units subcutaneously three times daily before meals, in ADDITION to 0-12 units per sliding scale: 70 - 150 : 0 units 151-200 : 2 units 201-250 : 4 units 251-300 : 6 units 301-350 : 8 units 351-400 : 10 units 401-450 : 12 units > 400 : give 12 units and recheck in 1 hour - if still > 400, call MD/NP for further orders.   10/05/2024   oxyCODONE  (OXY IR/ROXICODONE ) 5 MG immediate release tablet Take 1 tablet (5 mg total) by mouth every 6 (six) hours as needed for moderate pain (pain score 4-6) or severe pain (pain score 7-10). 30 tablet 0 10/05/2024   Phenylephrine  HCl (PREPARATION H) 0.25 % SUPP Place 1 suppository rectally every 6 (six) hours as needed (hemorrhoids).   Unknown   polyethylene glycol (MIRALAX  / GLYCOLAX ) 17 g packet Take 17 g by mouth daily as needed for moderate constipation.   Unknown   rOPINIRole  (REQUIP ) 2 MG tablet Take 2 mg by mouth at bedtime.   10/05/2024   senna (SENOKOT) 8.6 MG TABS tablet Take 17.2 mg by mouth every evening.   10/05/2024   sevelamer  carbonate (RENVELA ) 800 MG tablet Take 1 tablet (800 mg total) by mouth 3 (three) times daily with meals.   10/05/2024   tamsulosin  (FLOMAX ) 0.4 MG CAPS capsule Take 0.4 mg by mouth every evening.   10/05/2024   amoxicillin  (AMOXIL ) 500 MG capsule Take 500 mg by mouth 2 (two) times daily. (Patient not taking: Reported on 10/06/2024)   Not Taking   Scheduled:   atorvastatin   80 mg Per Tube Daily   Chlorhexidine  Gluconate Cloth  6 each Topical Daily   Chlorhexidine  Gluconate Cloth  6 each Topical Q0600    ipratropium-albuterol   3 mL Nebulization Q4H   lactulose   10 g Per Tube BID   multivitamin  1 tablet Per Tube QHS   mupirocin  ointment   Nasal BID   [START  ON 10/08/2024] nutrition supplement (JUVEN)  1 packet Per Tube BID BM   mouth rinse  15 mL Mouth Rinse Q2H   pantoprazole  (PROTONIX ) IV  40 mg Intravenous Daily   vancomycin  variable dose per unstable renal function (pharmacist dosing)   Does not apply See admin instructions   Infusions:   anticoagulant sodium citrate      dexmedetomidine  (PRECEDEX ) IV infusion Stopped (10/07/24 1455)   heparin  1,450 Units/hr (10/07/24 1500)   piperacillin -tazobactam (ZOSYN )  IV Stopped (10/07/24 1457)    Assessment: Patient with history of Afib on Eliquis  admitted from SNF with AMS and hypoxia. Last dose of Eliquis  1/26 8 pm. Noted to be ESRD. Pharmacy consulted to dose heparin . Will need to monitor aPTT and anti-Xa due to recent Eliquis .   Heparin  level > 1.1 as expected given recent Eliquis  exposure. aPTT 60 which is increasing with recent dose increase but remains subtherapeutic.  No pauses or infusion issues per RN. No bleeding reported.   Goal of Therapy:  Heparin  level 0.3-0.7 units/ml aPTT 66-102 Monitor platelets by anticoagulation protocol: Yes   Plan:  Increase heparin  infusion to 1550 units/hr Check aPTT in 8 hours and aPTT/HL daily while on heparin  Continue to monitor H&H and platelets F/u transition back to Eliquis  when able   Rankin Sams 10/07/2024,3:58 PM       [1]  Allergies Allergen Reactions   Contrast Media [Iodinated Contrast Media] Shortness Of Breath and Other (See Comments)    Altered mental status     Ivp Dye [Iodinated Contrast Media] Anaphylaxis, Shortness Of Breath and Other (See Comments)    Altered mental status     Latex Rash    severe rash appears after the first 24 hours of being placed   Tape Rash and Other (See Comments)    Rash after 1 day of use  Do not leave on longer then 3 days without  changing

## 2024-10-07 NOTE — Procedures (Signed)
 Patient Name: JABRI BLANCETT  MRN: [id number]  Epilepsy Attending: Arlin MALVA Krebs  Referring Physician/Provider: Antonetta Vina NOVAK, NP  Duration: 10/07/2024 0125 to 0830  Patient history: 69yo M with 2 to 3-day history of increased lethargy. EEG to evaluate for seizure  Level of alertness: comatose/ lethargic   AEDs during EEG study: None  Technical aspects: This EEG study was done with scalp electrodes positioned according to the 10-20 International system of electrode placement. Electrical activity was reviewed with band pass filter of 1-70Hz , sensitivity of 7 uV/mm, display speed of 64mm/sec with a 60Hz  notched filter applied as appropriate. EEG data were recorded continuously and digitally stored.  Video monitoring was available and reviewed as appropriate.  Description: EEG showed continuous generalized 3 to 6 Hz theta-delta slowing admixed with 15 to 18 Hz beta activity distributed symmetrically and diffusely. Hyperventilation and photic stimulation were not performed.     EEG was disconnected between 10/07/2024 0244 to 0538  ABNORMALITY - Continuous slow, generalized  IMPRESSION: This study is suggestive of severe diffuse encephalopathy, nonspecific etiology but likely related to sedation. No seizures or epileptiform discharges were seen throughout the recording.  Natividad Halls O Midge Momon

## 2024-10-07 NOTE — Progress Notes (Signed)
 PHARMACY - ANTICOAGULATION CONSULT NOTE  Pharmacy Consult for heparin  Indication: atrial fibrillation  Allergies[1]  Patient Measurements: Height: 6' 2 (188 cm) Weight: 89.5 kg (197 lb 5 oz) IBW/kg (Calculated) : 82.2 HEPARIN  DW (KG): 89.5  Vital Signs: Temp: 99.3 F (37.4 C) (01/28 0332) Temp Source: Axillary (01/28 0332) BP: 119/74 (01/28 0430) Pulse Rate: 81 (01/28 0430)  Labs: Recent Labs    10/06/24 0809 10/06/24 0845 10/06/24 1215 10/06/24 1323 10/06/24 1455 10/07/24 0324  HGB 10.1* 10.5* 11.6* 11.2*  --  12.4*  HCT 33.0* 31.0* 34.0* 33.0*  --  39.2  PLT 164  --   --   --   --  167  APTT  --   --   --   --   --  35  LABPROT 31.5*  --   --   --   --   --   INR 2.9*  --   --   --   --   --   HEPARINUNFRC  --   --   --   --   --  >1.10*  CREATININE 8.04* 8.40*  --   --  7.57* 8.23*    Estimated Creatinine Clearance: 9.7 mL/min (A) (by C-G formula based on SCr of 8.23 mg/dL (H)).   Medical History: Past Medical History:  Diagnosis Date   Carotid artery occlusion 11/10/2010   LEFT CAROTID ENDARTERECTOMY   Complication of anesthesia    BP WENT UP AT [facility]    COPD (chronic obstructive pulmonary disease) (HCC)    pt denies this dx as of 06/01/20 - no inhaler    Diabetes mellitus without complication (HCC)    Diverticulitis    Diverticulosis of colon (without mention of hemorrhage)    DJD (degenerative joint disease)    knees/hands/feet/back/neck   ESRD (end stage renal disease) on dialysis (HCC)    Fatty liver    Full dentures    GERD (gastroesophageal reflux disease)    H/O hiatal hernia    History of blood transfusion    with a past surical procedure per patient 06/01/20   History of right below knee amputation (HCC) 07/12/2016   Hyperlipidemia    Hypertension    Neuromuscular disorder (HCC)    peripheral neuropathy   Non-pressure chronic ulcer of other part of left foot limited to breakdown of skin (HCC) 11/12/2016   Nonrheumatic aortic (valve)  stenosis 03/18/2024   Limited TTE 03/18/2024: EF 55-60, moderate LVH, normal RVSF, moderate LAE, mild MR, mild-moderate AS (mean 12, V-max 235 cm/s, DI 0.35), aortic root 39 mm, moderate pleural effusion, trivial pericardial effusion    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 Atherectomy left popliteal artery July 2012.    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 60 tablet 1 10/05/2024 at  8:00 PM   atorvastatin  (LIPITOR ) 80 MG tablet Take 1 tablet (80  mg total) by mouth at bedtime. (Patient taking differently: Take 80 mg by mouth every evening.)   10/05/2024   carvedilol  (COREG ) 12.5 MG tablet Take 1 tablet (12.5 mg total) by mouth 2 (two) times daily with a meal.   10/05/2024   colchicine  0.6 MG tablet Take 0.5 tablets (0.3 mg total) by mouth daily. (Patient taking differently: Take 0.3 mg by mouth every evening.)   10/05/2024   CREAM BASE EX Apply 1 Application topically in the morning and at bedtime. Apply to incontinence area  PRN order: apply twice daily as needed for barrier cream   10/05/2024   ethyl chloride spray Apply 1 Application topically See admin instructions. Apply to vascular access in the morning on Tuesday, Thursday, and Saturday.   10/03/2024   famotidine  (PEPCID ) 20 MG tablet Take 20 mg by mouth at bedtime.   10/05/2024   fentaNYL  (DURAGESIC ) 12 MCG/HR Place 1 patch onto the skin every 3 (three) days. 5 patch 0 10/05/2024   ferrous sulfate  325  (65 FE) MG tablet Take 325 mg by mouth every evening.   10/05/2024   folic acid  (FOLVITE ) 1 MG tablet Take 1 mg by mouth every evening.   10/05/2024   gabapentin  (NEURONTIN ) 100 MG capsule Take 100 mg by mouth 3 (three) times daily.   10/05/2024   insulin  aspart (NOVOLOG  FLEXPEN) 100 UNIT/ML FlexPen Inject 4-12 Units into the skin See admin instructions. Inject 4 units subcutaneously three times daily before meals, in ADDITION to 0-12 units per sliding scale: 70 - 150 : 0 units 151-200 : 2 units 201-250 : 4 units 251-300 : 6 units 301-350 : 8 units 351-400 : 10 units 401-450 : 12 units > 400 : give 12 units and recheck in 1 hour - if still > 400, call MD/NP for further orders.   10/05/2024   oxyCODONE  (OXY IR/ROXICODONE ) 5 MG immediate release tablet Take 1 tablet (5 mg total) by mouth every 6 (six) hours as needed for moderate pain (pain score 4-6) or severe pain (pain score 7-10). 30 tablet 0 10/05/2024   Phenylephrine  HCl (PREPARATION H) 0.25 % SUPP Place 1 suppository rectally every 6 (six) hours as needed (hemorrhoids).   Unknown   polyethylene glycol (MIRALAX  / GLYCOLAX ) 17 g packet Take 17 g by mouth daily as needed for moderate constipation.   Unknown   rOPINIRole  (REQUIP ) 2 MG tablet Take 2 mg by mouth at bedtime.   10/05/2024   senna (SENOKOT) 8.6 MG TABS tablet Take 17.2 mg by mouth every evening.   10/05/2024   sevelamer  carbonate (RENVELA ) 800 MG tablet Take 1 tablet (800 mg total) by mouth 3 (three) times daily with meals.   10/05/2024   tamsulosin  (FLOMAX ) 0.4 MG CAPS capsule Take 0.4 mg by mouth every evening.   10/05/2024   amoxicillin  (AMOXIL ) 500 MG capsule Take 500 mg by mouth 2 (two) times daily. (Patient not taking: Reported on 10/06/2024)   Not Taking   Scheduled:   atorvastatin   80 mg Per Tube Daily   Chlorhexidine  Gluconate Cloth  6 each Topical Daily   ipratropium-albuterol   3 mL Nebulization Q4H   lactulose   10 g Per Tube BID   mupirocin  ointment   Nasal BID   mouth rinse   15 mL Mouth Rinse Q2H   pantoprazole  (PROTONIX ) IV  40 mg Intravenous Daily   Infusions:   dexmedetomidine  (PRECEDEX ) IV infusion 0.9 mcg/kg/hr (10/07/24 0400)   heparin  1,250 Units/hr (10/07/24 0400)   piperacillin -tazobactam (ZOSYN )  IV  Stopped (10/06/24 2232)   [START ON 10/08/2024] vancomycin       Assessment: Patient with history of Afib on Eliquis  admitted from SNF with AMS and hypoxia. Last dose of Eliquis  1/26 8 pm. Noted to be ESRD. Pharmacy consulted to dose heparin . Will need to monitor aPTT and anti-Xa due to recent Eliquis .   1/28 AM update:  aPTT sub-therapeutic   Goal of Therapy:  Heparin  level 0.3-0.7 units/ml aPTT 66-102 Monitor platelets by anticoagulation protocol: Yes   Plan:  Inc heparin  to 1450 units/hr Check anti-Xa level and aPTT in 8 hours and daily while on heparin  Continue to monitor H&H and platelets  Lynwood Mckusick, PharmD, BCPS Clinical Pharmacist Phone: [phone]        [1]  Allergies Allergen Reactions   Contrast Media [Iodinated Contrast Media] Shortness Of Breath and Other (See Comments)    Altered mental status     Ivp Dye [Iodinated Contrast Media] Anaphylaxis, Shortness Of Breath and Other (See Comments)    Altered mental status     Latex Rash    severe rash appears after the first 24 hours of being placed   Tape Rash and Other (See Comments)    Rash after 1 day of use  Do not leave on longer then 3 days without changing

## 2024-10-07 NOTE — Consult Note (Signed)
 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  Wound type: Chronic skin damage bilateral buttock; full thickness; Stage 3; pink; clean Chronic wound right anterior hand; full thickness wound; dry/fibrinous with erythema, also recommend painting this area with betadine due to history of arterial disease. Open wound third finger; anterior appears dry, will also ask to apply betadine only  Gangrenous changes at the amputation site; 100% black request contact orthopedics for this; until can paint with betadine daily  Right FU skin tear; utilize nursing skin care order set   Pressure Injury POA: Yes Measurement: see nursing flow sheets Wound bed: see above  Drainage (amount, consistency, odor) see nursing flow sheets Periwound: intact; dry Dressing procedure/placement/frequency: Cleanse buttock and scrotal wounds with saline, pat dry, cover open areas with xeroform ; protect with foam, change every other day and PRN soilage Turn and reposition patient per hospital policy Apply betadine (paint with swab) finger amputation site and right anterior hand wound, allow to air dry, cover with dry dressing and gauze.  Xeroform and foam to the skin tear per the nursing skin care protocol.     Re consult if needed, will not follow at this time. Thanks  Handy Mcloud [employer] MSN, RN,CWOCN, CNS, WILLETTE [phone]     Wound type: Pressure Injury POA: Yes/No/NA Measurement: Wound bed: Drainage (amount, consistency, odor)  Periwound: Dressing procedure/placement/frequency: Conservative sharp wound debridement (CSWD performed at the bedside):

## 2024-10-07 NOTE — Progress Notes (Signed)
 VENTILATOR WEAN NOTE 10/07/2024  Start Mode: PRVC  Wean Mode: Pressure Support  Duration before failure:   Reason for failure: N/A  Notes: Patient placed on vent wean PS/CPAP 12/5 @ 1124. Patient tolerating well at this time. RN notified.

## 2024-10-07 NOTE — Progress Notes (Signed)
 VENTILATOR WEAN NOTE 10/07/2024  Start Mode: PRVC  Wean Mode: Pressure Support  Duration before failure:   Reason for failure: N/A  Notes: RT placed patient on vent wean PS/CPAP 5/5. Patient tolerating well. CCM and RN notified.

## 2024-10-07 NOTE — Progress Notes (Signed)
 Pt receives out-pt HD at [facility], TTS 1140am chair time. Will continue to assist as needed.    Lavanda Starkisha Tullis Dialysis Navigator [phone]

## 2024-10-07 NOTE — Progress Notes (Signed)
 [name] is an 69 y.o. male ESRD on dialysis, DM, BKA, HTN, HLD, OSA p/w confusion and lethargy for 3 days. Upon arrival to the ED, systolic BP in the 60's, O2 80% on RA. He reportedly had two fentanyl  patches on him which were removed narcan  was administered. He was given 1L of LR with some improvement in mentation. Labs notable for K 151 BUN 54 Cr 8.4 WBC 9.5 Hgb 10.5. CXR concerning for pneumonia. Sepsis protocol was initiated and we was given vanc and cefepime . Of note, he was recently discharge from [facility] with metabolic encephalopathy and sepsis 2/2 pneumonia, was discharged on amoxicillin . Last dialysis was 10/03/24 for 1hr  Dialysis Orders:  [facility] on TTS 180NRe EDW 106kg BFR 450 DFR 800 4hr 2K 2.5Ca No heparin  Mircera 200mcg IV q 2 weeks- last 10/03/24 Venofer  50mg  weekly Hectorol  2mcg Iv q HD  Assessment/Plan:  AMS: Started on sepsis protocol, had recent admission for AMS and had pneumonia at that time. Work up per admitting team  ESRD:  On TTS Saturday, last HD was Saturday but shortened due to weather.  - Seen on HD 2K bath lt BCF 114/72 HR84 but has increase at times during the treatment No UF for now  Plan for treatment tomorrow as well    Hypertension/volume: BP soft, received 1L LR in the ED. Does not appear volume overloaded on exam  Anemia: Hgb at goal, Not due for ESA  Metabolic bone disease: Calcium  controlled, follow phos. Resume binders once eating  Nutrition:  Currently NPO  Subjective: Tolerating HD at the moment   Chemistry and CBC: Creat  Date/Time Value Ref Range Status  02/01/2012 10:28 AM 1.00 0.50 - 1.35 mg/dL Final  96/87/7986 96:96 PM 0.72 0.50 - 1.35 mg/dL Final   Creatinine, Ser  Date/Time Value Ref Range Status  10/07/2024 03:24 AM 8.23 (H) 0.61 - 1.24 mg/dL Final  98/72/7973 97:44 PM 7.57 (H) 0.61 - 1.24 mg/dL Final  98/72/7973 91:54 AM 8.40 (H) 0.61 - 1.24 mg/dL Final  98/72/7973 91:90 AM 8.04 (H) 0.61 - 1.24 mg/dL Final   98/83/7973 96:57 AM 6.94 (H) 0.61 - 1.24 mg/dL Final  98/84/7973 91:88 AM 5.96 (H) 0.61 - 1.24 mg/dL Final  98/85/7973 91:58 AM 9.24 (H) 0.61 - 1.24 mg/dL Final  98/87/7973 97:94 AM 7.05 (H) 0.61 - 1.24 mg/dL Final  98/88/7973 97:52 AM 6.39 (H) 0.61 - 1.24 mg/dL Final  98/89/7973 96:63 AM 7.74 (H) 0.61 - 1.24 mg/dL Final  98/90/7973 91:80 PM 7.47 (H) 0.61 - 1.24 mg/dL Final  87/75/7974 95:57 AM 6.15 (H) 0.61 - 1.24 mg/dL Final  87/76/7974 94:90 AM 5.04 (H) 0.61 - 1.24 mg/dL Final  87/77/7974 94:45 AM 6.26 (H) 0.61 - 1.24 mg/dL Final  87/78/7974 93:78 AM 4.95 (H) 0.61 - 1.24 mg/dL Final  87/79/7974 91:42 AM 5.75 (H) 0.61 - 1.24 mg/dL Final  87/80/7974 94:49 AM 6.89 (H) 0.61 - 1.24 mg/dL Final  87/81/7974 90:72 AM 6.07 (H) 0.61 - 1.24 mg/dL Final  87/82/7974 98:98 PM 7.96 (H) 0.61 - 1.24 mg/dL Final  87/83/7974 91:67 PM 7.16 (H) 0.61 - 1.24 mg/dL Final  87/83/7974 96:61 PM 7.30 (H) 0.61 - 1.24 mg/dL Final  87/83/7974 97:84 PM 6.63 (H) 0.61 - 1.24 mg/dL Final  87/96/7974 97:85 AM 7.85 (H) 0.61 - 1.24 mg/dL Final  87/97/7974 91:82 PM 7.20 (H) 0.61 - 1.24 mg/dL Final  92/80/7974 92:83 AM 5.86 (H) 0.61 - 1.24 mg/dL Final  92/81/7974 97:98 AM 4.52 (H) 0.61 -  1.24 mg/dL Final  92/82/7974 91:94 AM 6.48 (H) 0.61 - 1.24 mg/dL Final  92/83/7974 88:99 AM 5.75 (H) 0.61 - 1.24 mg/dL Final  92/84/7974 93:92 PM 4.93 (H) 0.61 - 1.24 mg/dL Final  92/89/7974 87:77 AM 7.33 (H) 0.61 - 1.24 mg/dL Final  93/86/7974 93:95 AM 3.55 (H) 0.61 - 1.24 mg/dL Final  93/87/7974 93:66 AM 4.24 (H) 0.61 - 1.24 mg/dL Final  93/88/7974 88:88 AM 3.14 (H) 0.61 - 1.24 mg/dL Final  93/89/7974 91:97 AM 4.23 (H) 0.61 - 1.24 mg/dL Final    Comment:    DELTA CHECK NOTED  02/17/2024 06:36 AM 2.81 (H) 0.61 - 1.24 mg/dL Final  93/91/7974 95:70 AM 1.91 (H) 0.61 - 1.24 mg/dL Final  93/92/7974 95:90 PM 2.24 (H) 0.61 - 1.24 mg/dL Final  93/92/7974 96:69 AM 2.60 (H) 0.61 - 1.24 mg/dL Final  93/93/7974 96:63 PM 2.96 (H) 0.61 - 1.24  mg/dL Final  93/93/7974 95:59 AM 3.53 (H) 0.61 - 1.24 mg/dL Final  93/94/7974 96:62 PM 4.89 (H) 0.61 - 1.24 mg/dL Final  93/94/7974 94:81 AM 5.23 (H) 0.61 - 1.24 mg/dL Final  93/95/7974 95:61 PM 7.85 (H) 0.61 - 1.24 mg/dL Final  93/95/7974 94:77 AM 7.44 (H) 0.61 - 1.24 mg/dL Final  93/96/7974 97:54 PM 6.26 (H) 0.61 - 1.24 mg/dL Final  94/74/7974 92:47 AM 9.62 (H) 0.61 - 1.24 mg/dL Final  94/75/7974 88:80 AM 12.57 (H) 0.61 - 1.24 mg/dL Final  94/76/7974 93:77 PM 10.65 (H) 0.61 - 1.24 mg/dL Final  96/82/7974 96:97 AM 7.31 (H) 0.61 - 1.24 mg/dL Final  96/83/7974 91:52 PM 7.05 (H) 0.61 - 1.24 mg/dL Final  96/83/7974 91:63 AM 6.49 (H) 0.61 - 1.24 mg/dL Final  96/84/7974 88:46 AM 5.50 (H) 0.61 - 1.24 mg/dL Final   Recent Labs  Lab 10/06/24 0809 10/06/24 0845 10/06/24 1215 10/06/24 1323 10/06/24 1455 10/07/24 0324  NA 141 141 142 140 143 141  K 4.9 4.6 4.8 4.6 4.8 5.6*  CL 99 100  --   --  100 96*  CO2 25  --   --   --  24 19*  GLUCOSE 167* 160*  --   --  158* 170*  BUN 59* 54*  --   --  60* 70*  CREATININE 8.04* 8.40*  --   --  7.57* 8.23*  CALCIUM  8.7*  --   --   --  8.1* 8.7*  PHOS  --   --   --   --   --  5.4*   Recent Labs  Lab 10/06/24 0809 10/06/24 0845 10/06/24 1215 10/06/24 1323 10/07/24 0324  WBC 9.5  --   --   --  9.5  NEUTROABS 7.4  --   --   --   --   HGB 10.1* 10.5* 11.6* 11.2* 12.4*  HCT 33.0* 31.0* 34.0* 33.0* 39.2  MCV 95.9  --   --   --  93.1  PLT 164  --   --   --  167   Liver Function Tests: Recent Labs  Lab 10/06/24 0809 10/07/24 0324  AST 19  --   ALT 13  --   ALKPHOS 128*  --   BILITOT 0.7  --   PROT 5.9*  --   ALBUMIN  3.3* 3.3*   No results for input(s): LIPASE, AMYLASE in the last 168 hours. Recent Labs  Lab 10/06/24 1053  AMMONIA 63*   Cardiac Enzymes: No results for input(s): CKTOTAL, CKMB, CKMBINDEX, TROPONINI in the last 168 hours. Iron Studies:  No results for input(s): IRON, TIBC, TRANSFERRIN, FERRITIN in  the last 72 hours. PT/INR: @LABRCNTIP (inr:5)  Xrays/Other Studies: ) Results for orders placed or performed during the hospital encounter of 10/06/24 (from the past 48 hours)  Blood Culture (routine x 2)     Status: None (Preliminary result)   Collection Time: 10/06/24  8:05 AM   Specimen: BLOOD RIGHT FOREARM  Result Value Ref Range   Specimen Description BLOOD RIGHT FOREARM    Special Requests      BOTTLES DRAWN AEROBIC AND ANAEROBIC Blood Culture adequate volume   Culture  Setup Time      GRAM POSITIVE COCCI IN CLUSTERS ANAEROBIC BOTTLE ONLY Organism ID to follow Performed at [facility] Lab, [address], [place], [place] [address]    Culture PENDING    Report Status PENDING   Comprehensive metabolic panel     Status: Abnormal   Collection Time: 10/06/24  8:09 AM  Result Value Ref Range   Sodium 141 135 - 145 mmol/L   Potassium 4.9 3.5 - 5.1 mmol/L   Chloride 99 98 - 111 mmol/L   CO2 25 22 - 32 mmol/L   Glucose, Bld 167 (H) 70 - 99 mg/dL    Comment: Glucose reference range applies only to samples taken after fasting for at least 8 hours.   BUN 59 (H) 8 - 23 mg/dL   Creatinine, Ser 1.95 (H) 0.61 - 1.24 mg/dL   Calcium  8.7 (L) 8.9 - 10.3 mg/dL   Total Protein 5.9 (L) 6.5 - 8.1 g/dL   Albumin  3.3 (L) 3.5 - 5.0 g/dL   AST 19 15 - 41 U/L   ALT 13 0 - 44 U/L   Alkaline Phosphatase 128 (H) 38 - 126 U/L   Total Bilirubin 0.7 0.0 - 1.2 mg/dL   GFR, Estimated 7 (L) >60 mL/min    Comment: (NOTE) Calculated using the CKD-EPI Creatinine Equation (2021)    Anion gap 18 (H) 5 - 15    Comment: Performed at [facility], [address] [place], [place] [address]  CBC with Differential     Status: Abnormal   Collection Time: 10/06/24  8:09 AM  Result Value Ref Range   WBC 9.5 4.0 - 10.5 K/uL   RBC 3.44 (L) 4.22 - 5.81 MIL/uL   Hemoglobin 10.1 (L) 13.0 - 17.0 g/dL   HCT 66.9 (L) 60.9 - 47.9 %   MCV 95.9 80.0 - 100.0 fL   MCH 29.4 26.0 - 34.0 pg   MCHC 30.6 30.0 -  36.0 g/dL   RDW 79.1 (H) 88.4 - 84.4 %   Platelets 164 150 - 400 K/uL   nRBC 0.0 0.0 - 0.2 %   Neutrophils Relative % 77 %   Neutro Abs 7.4 1.7 - 7.7 K/uL   Lymphocytes Relative 13 %   Lymphs Abs 1.2 0.7 - 4.0 K/uL   Monocytes Relative 8 %   Monocytes Absolute 0.8 0.1 - 1.0 K/uL   Eosinophils Relative 1 %   Eosinophils Absolute 0.1 0.0 - 0.5 K/uL   Basophils Relative 1 %   Basophils Absolute 0.1 0.0 - 0.1 K/uL   Immature Granulocytes 0 %   Abs Immature Granulocytes 0.04 0.00 - 0.07 K/uL    Comment: Performed at [facility], [address] [place], [place] [address]  Protime-INR     Status: Abnormal   Collection Time: 10/06/24  8:09 AM  Result Value Ref Range   Prothrombin Time 31.5 (  H) 11.4 - 15.2 seconds   INR 2.9 (H) 0.8 - 1.2    Comment: (NOTE) INR goal varies based on device and disease states. Performed at [facility], [address] [place], [place] [address]   Pro Brain natriuretic peptide     Status: Abnormal   Collection Time: 10/06/24  8:09 AM  Result Value Ref Range   Pro Brain Natriuretic Peptide >35,000.0 (H) <300.0 pg/mL    Comment: (NOTE) Age Group        Cut-Points    Interpretation  < 50 years     450 pg/mL       NT-proBNP > 450 pg/mL indicates                                ADHF is likely              50 to 75 years  900 pg/mL      NT-proBNP > 900 pg/mL indicates          ADHF is likely  > 75 years      1800 pg/mL     NT-proBNP > 1800 pg/mL indicates          ADHF is likely                           All ages    Results between       Indeterminate. Further clinical             300 and the cut-   information is needed to determine            point for age group   if ADHF is present.                                                             Elecsys proBNP II/ Elecsys proBNP II STAT           Cut-Point                       Interpretation  300 pg/mL                    NT-proBNP <300pg/mL indicates                             ADHF  is not likely  Performed at [facility], [address], [place], [place] [address]   I-stat chem 8, ED (not at [facility], [facility] or [facility])     Status: Abnormal   Collection Time: 10/06/24  8:45 AM  Result Value Ref Range   Sodium 141 135 - 145 mmol/L   Potassium 4.6 3.5 - 5.1 mmol/L   Chloride 100 98 - 111 mmol/L   BUN 54 (H) 8 - 23 mg/dL   Creatinine, Ser 1.59 (H) 0.61 - 1.24 mg/dL   Glucose, Bld 839 (H) 70 - 99 mg/dL    Comment: Glucose reference range applies only to samples taken after fasting for at least 8 hours.   Calcium , Ion 1.03 (L) 1.15 - 1.40 mmol/L   TCO2 29 22 -  32 mmol/L   Hemoglobin 10.5 (L) 13.0 - 17.0 g/dL   HCT 68.9 (L) 60.9 - 47.9 %  I-Stat Lactic Acid, ED     Status: Abnormal   Collection Time: 10/06/24  8:46 AM  Result Value Ref Range   Lactic Acid, Venous 2.2 (HH) 0.5 - 1.9 mmol/L   Comment NOTIFIED PHYSICIAN   Resp panel by RT-PCR (RSV, Flu A&B, Covid) Anterior Nasal Swab     Status: None   Collection Time: 10/06/24  9:31 AM   Specimen: Anterior Nasal Swab  Result Value Ref Range   SARS Coronavirus 2 by RT PCR NEGATIVE NEGATIVE   Influenza A by PCR NEGATIVE NEGATIVE   Influenza B by PCR NEGATIVE NEGATIVE    Comment: (NOTE) The Xpert Xpress SARS-CoV-2/FLU/RSV plus assay is intended as an aid in the diagnosis of influenza from Nasopharyngeal swab specimens and should not be used as a sole basis for treatment. Nasal washings and aspirates are unacceptable for Xpert Xpress SARS-CoV-2/FLU/RSV testing.  Fact Sheet for Patients: bloggercourse.com  Fact Sheet for Healthcare Providers: seriousbroker.it  This test is not yet approved or cleared by the [place]  FDA and has been authorized for detection and/or diagnosis of SARS-CoV-2 by FDA under an Emergency Use Authorization (EUA). This EUA will remain in effect (meaning this test can be used) for the duration of the COVID-19 declaration under Section  564(b)(1) of the Act, 21 U.S.C. section 360bbb-3(b)(1), unless the authorization is terminated or revoked.     Resp Syncytial Virus by PCR NEGATIVE NEGATIVE    Comment: (NOTE) Fact Sheet for Patients: bloggercourse.com  Fact Sheet for Healthcare Providers: seriousbroker.it  This test is not yet approved or cleared by the [place]  FDA and has been authorized for detection and/or diagnosis of SARS-CoV-2 by FDA under an Emergency Use Authorization (EUA). This EUA will remain in effect (meaning this test can be used) for the duration of the COVID-19 declaration under Section 564(b)(1) of the Act, 21 U.S.C. section 360bbb-3(b)(1), unless the authorization is terminated or revoked.  Performed at [facility] Lab, [address] [place], [place] [address]   Ammonia     Status: Abnormal   Collection Time: 10/06/24 10:53 AM  Result Value Ref Range   Ammonia 63 (H) 9 - 35 umol/L    Comment: Performed at [facility], [address], [place], [place] [address]  I-Stat Lactic Acid, ED     Status: None   Collection Time: 10/06/24 10:59 AM  Result Value Ref Range   Lactic Acid, Venous 0.8 0.5 - 1.9 mmol/L  CBG monitoring, ED     Status: Abnormal   Collection Time: 10/06/24 12:08 PM  Result Value Ref Range   Glucose-Capillary 138 (H) 70 - 99 mg/dL    Comment: Glucose reference range applies only to samples taken after fasting for at least 8 hours.  I-Stat venous blood gas, ([facility] ED, [facility], [facility])     Status: Abnormal   Collection Time: 10/06/24 12:15 PM  Result Value Ref Range   pH, Ven 7.356 7.25 - 7.43   pCO2, Ven 52.4 44 - 60 mmHg   pO2, Ven 27 (LL) 32 - 45 mmHg   Bicarbonate 29.4 (H) 20.0 - 28.0 mmol/L   TCO2 31 22 - 32 mmol/L   O2 Saturation 45 %   Acid-Base Excess 3.0 (H) 0.0 - 2.0 mmol/L   Sodium 142 135 - 145 mmol/L   Potassium 4.8 3.5 - 5.1 mmol/L   Calcium , Ion  1.02 (L) 1.15 - 1.40 mmol/L   HCT 34.0 (L) 39.0 - 52.0  %   Hemoglobin 11.6 (L) 13.0 - 17.0 g/dL   Collection site Brachial    Drawn by HIDE    Sample type VENOUS    Comment NOTIFIED PHYSICIAN   I-Stat CG4 Lactic Acid, ED     Status: None   Collection Time: 10/06/24 12:17 PM  Result Value Ref Range   Lactic Acid, Venous 1.2 0.5 - 1.9 mmol/L  Culture, Respiratory w Gram Stain     Status: None (Preliminary result)   Collection Time: 10/06/24 12:36 PM   Specimen: Tracheal Aspirate; Respiratory  Result Value Ref Range   Specimen Description TRACHEAL ASPIRATE    Special Requests NONE    Gram Stain      FEW WBC PRESENT, PREDOMINANTLY PMN FEW GRAM POSITIVE COCCI Performed at [facility], [address], [place], [place] [address]    Culture PENDING    Report Status PENDING   MRSA Next Gen by PCR, Nasal     Status: Abnormal   Collection Time: 10/06/24  1:00 PM  Result Value Ref Range   MRSA by PCR Next Gen DETECTED (A) NOT DETECTED    Comment: RESULT CALLED TO, READ BACK BY AND VERIFIED WITH: RN Tillman on [phone] @1830  by SM (NOTE) The GeneXpert MRSA Assay (FDA approved for NASAL specimens only), is one component of a comprehensive MRSA colonization surveillance program. It is not intended to diagnose MRSA infection nor to guide or monitor treatment for MRSA infections. Test performance is not FDA approved in patients less than 87 years old. Performed at [facility], [address], [place], [place] [address]   I-Stat arterial blood gas, ED     Status: Abnormal   Collection Time: 10/06/24  1:23 PM  Result Value Ref Range   pH, Arterial 7.444 7.35 - 7.45   pCO2 arterial 37.2 32 - 48 mmHg   pO2, Arterial 414 (H) 83 - 108 mmHg   Bicarbonate 25.5 20.0 - 28.0 mmol/L   TCO2 27 22 - 32 mmol/L   O2 Saturation 100 %   Acid-Base Excess 1.0 0.0 - 2.0 mmol/L   Sodium 140 135 - 145 mmol/L   Potassium 4.6 3.5 - 5.1 mmol/L   Calcium , Ion 1.05 (L) 1.15 - 1.40 mmol/L   HCT 33.0 (L) 39.0 - 52.0 %   Hemoglobin 11.2 (L) 13.0 - 17.0 g/dL    Sample type ARTERIAL   Basic metabolic panel     Status: Abnormal   Collection Time: 10/06/24  2:55 PM  Result Value Ref Range   Sodium 143 135 - 145 mmol/L   Potassium 4.8 3.5 - 5.1 mmol/L   Chloride 100 98 - 111 mmol/L   CO2 24 22 - 32 mmol/L   Glucose, Bld 158 (H) 70 - 99 mg/dL    Comment: Glucose reference range applies only to samples taken after fasting for at least 8 hours.   BUN 60 (H) 8 - 23 mg/dL   Creatinine, Ser 2.42 (H) 0.61 - 1.24 mg/dL   Calcium  8.1 (L) 8.9 - 10.3 mg/dL   GFR, Estimated 7 (L) >60 mL/min    Comment: (NOTE) Calculated using the CKD-EPI Creatinine Equation (2021)    Anion gap 19 (H) 5 - 15    Comment: Performed at [facility], [address] [place], [place] [address]  Magnesium      Status: None   Collection Time: 10/06/24  2:55 PM  Result Value Ref Range   Magnesium  2.2 1.7 - 2.4 mg/dL    Comment: Performed at [facility], [address], [place], [place] [address]  Hepatitis B surface antigen     Status: None   Collection Time: 10/06/24  2:55 PM  Result Value Ref Range   Hepatitis B Surface Ag NON REACTIVE NON REACTIVE    Comment: Performed at [facility], [address] [place], [place] [address]  Hepatitis B surface antibody,quantitative     Status: None   Collection Time: 10/06/24  2:55 PM  Result Value Ref Range   Hep B S AB Quant (Post) 42,475.0 Immunity>10 mIU/mL    Comment: (NOTE) Results confirmed on dilution.  Status of Immunity                     Anti-HBs Level  ------------------                     -------------- Inconsistent with Immunity                  0.0 - 10.0 Consistent with Immunity                         >10.0 Performed At: [facility] [address] [place], [place] [id number] Jennette Shorter MD Ey:[id number]   Glucose, capillary     Status: Abnormal   Collection Time: 10/06/24  3:34 PM  Result Value Ref Range   Glucose-Capillary 156 (H) 70 - 99 mg/dL    Comment: Glucose reference  range applies only to samples taken after fasting for at least 8 hours.  Glucose, capillary     Status: Abnormal   Collection Time: 10/06/24  7:57 PM  Result Value Ref Range   Glucose-Capillary 158 (H) 70 - 99 mg/dL    Comment: Glucose reference range applies only to samples taken after fasting for at least 8 hours.   Comment 1 Notify RN   Glucose, capillary     Status: Abnormal   Collection Time: 10/06/24 11:42 PM  Result Value Ref Range   Glucose-Capillary 153 (H) 70 - 99 mg/dL    Comment: Glucose reference range applies only to samples taken after fasting for at least 8 hours.   Comment 1 Notify RN   CBC     Status: Abnormal   Collection Time: 10/07/24  3:24 AM  Result Value Ref Range   WBC 9.5 4.0 - 10.5 K/uL   RBC 4.21 (L) 4.22 - 5.81 MIL/uL   Hemoglobin 12.4 (L) 13.0 - 17.0 g/dL   HCT 60.7 60.9 - 47.9 %   MCV 93.1 80.0 - 100.0 fL   MCH 29.5 26.0 - 34.0 pg   MCHC 31.6 30.0 - 36.0 g/dL   RDW 78.8 (H) 88.4 - 84.4 %   Platelets 167 150 - 400 K/uL   nRBC 0.0 0.0 - 0.2 %    Comment: Performed at [facility], [address], [place], [place] [address]  Renal function panel     Status: Abnormal   Collection Time: 10/07/24  3:24 AM  Result Value Ref Range   Sodium 141 135 - 145 mmol/L    Comment: Electrolytes repeated to verify    Potassium 5.6 (H) 3.5 - 5.1 mmol/L    Comment: HEMOLYSIS AT THIS LEVEL MAY AFFECT RESULT   Chloride 96 (L) 98 - 111 mmol/L   CO2  19 (L) 22 - 32 mmol/L   Glucose, Bld 170 (H) 70 - 99 mg/dL    Comment: Glucose reference range applies only to samples taken after fasting for at least 8 hours.   BUN 70 (H) 8 - 23 mg/dL   Creatinine, Ser 1.76 (H) 0.61 - 1.24 mg/dL   Calcium  8.7 (L) 8.9 - 10.3 mg/dL   Phosphorus 5.4 (H) 2.5 - 4.6 mg/dL   Albumin  3.3 (L) 3.5 - 5.0 g/dL   GFR, Estimated 6 (L) >60 mL/min    Comment: (NOTE) Calculated using the CKD-EPI Creatinine Equation (2021)    Anion gap 26 (H) 5 - 15    Comment: Performed at [facility], [address], [place], [place] [address]  APTT     Status: None   Collection Time: 10/07/24  3:24 AM  Result Value Ref Range   aPTT 35 24 - 36 seconds    Comment: Performed at [facility], [address], [place], [place] [address]  Heparin  level (unfractionated)     Status: Abnormal   Collection Time: 10/07/24  3:24 AM  Result Value Ref Range   Heparin  Unfractionated >1.10 (H) 0.30 - 0.70 IU/mL    Comment: (NOTE) The clinical reportable range upper limit is being lowered to >1.10 to align with the FDA approved guidance for the current laboratory assay.  If heparin  results are below expected values, and patient dosage has  been confirmed, suggest follow up testing of antithrombin III levels. Performed at [facility] Lab, [address], [place], [place] [address]   Glucose, capillary     Status: Abnormal   Collection Time: 10/07/24  3:31 AM  Result Value Ref Range   Glucose-Capillary 160 (H) 70 - 99 mg/dL    Comment: Glucose reference range applies only to samples taken after fasting for at least 8 hours.   Comment 1 Notify RN   Glucose, capillary     Status: Abnormal   Collection Time: 10/07/24  7:20 AM  Result Value Ref Range   Glucose-Capillary 135 (H) 70 - 99 mg/dL    Comment: Glucose reference range applies only to samples taken after fasting for at least 8 hours.   *Note: Due to a large number of results and/or encounters for the requested time period, some results have not been displayed. A complete set of results can be found in Results Review.   DG Abd Portable 1V Result Date: 10/06/2024 CLINICAL DATA:  OG tube placement EXAM: PORTABLE ABDOMEN - 1 VIEW COMPARISON:  10/06/2024 FINDINGS: Enteric tube tip below the diaphragm but incompletely included in the field of view. Cardiomegaly and dense retrocardiac airspace disease, possible left effusion. IMPRESSION: Enteric tube tip below the diaphragm but incompletely included in the field of view. Electronically  Signed   By: Luke Bun M.D.   On: 10/06/2024 23:46   MR BRAIN WO CONTRAST Result Date: 10/06/2024 CLINICAL DATA:  Initial evaluation for acute neuro deficit, stroke suspected. EXAM: MRI HEAD WITHOUT CONTRAST MRA HEAD WITHOUT CONTRAST MRA NECK WITHOUT CONTRAST TECHNIQUE: Multiplanar, multiecho pulse sequences of the brain and surrounding structures were obtained without intravenous contrast. Angiographic images of the Circle of Willis were obtained using MRA technique without intravenous contrast. Angiographic images of the neck were obtained using MRA technique without intravenous contrast. Carotid stenosis measurements (when applicable) are obtained utilizing NASCET criteria, using the distal internal carotid diameter as the denominator. COMPARISON:  CT from earlier the same day. FINDINGS: MRI HEAD FINDINGS  Brain: Diffuse prominence of the CSF containing spaces compatible with generalized cerebral atrophy. Patchy T2/FLAIR hyperintensity involving the periventricular and deep white matter, consistent chronic small vessel ischemic disease, mild in nature. 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 Mild scattered chronic hemosiderin staining noted about a few of these chronic infarcts. Few small remote bilateral cerebellar infarcts noted. Remote lacunar infarct at the right thalamus. No evidence for acute or subacute ischemia. Gray-white matter differentiation otherwise maintained. No acute intracranial hemorrhage. Few additional small chronic micro hemorrhages noted, likely small vessel related. No mass lesion, midline shift or mass effect. No hydrocephalus or extra-axial fluid collection. Pituitary gland within normal limits. Vascular: Loss of normal flow void within the intradural left V4 segment, likely occluded. Major intracranial vascular flow voids are otherwise maintained. Skull and upper  cervical spine: Craniocervical junction within normal limits. Bone marrow signal intensity overall within normal limits. No scalp soft tissue abnormality. Sinuses/Orbits: Prior bilateral ocular lens replacement. Paranasal sinuses are largely clear. Small left mastoid effusion noted, of doubtful significance. Patient appears to be intubated. Other: None. MRA HEAD FINDINGS ANTERIOR CIRCULATION: Both internal carotid arteries are patent through the siphons without significant stenosis or other abnormality. Right A1 segment patent. Left A1 hypoplastic and not well seen. Normal anterior communicating complex. Anterior cerebral arteries patent without significant stenosis. No M1 stenosis or occlusion. No proximal MCA branch occlusion or high-grade stenosis. Distal MCA branches perfused and symmetric. POSTERIOR CIRCULATION: Dominant right vertebral artery patent to the vertebrobasilar junction without stenosis. Right PICA grossly patent at its origin. 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 Mild retrograde filling of the distal left V4 segment via the vertebrobasilar junction. Left PICA not well seen on this exam. Basilar patent without stenosis. Superior cerebellar arteries patent bilaterally. Both PCAs primarily supplied via the basilar patent to their distal aspects without significant stenosis. No intracranial aneurysm. MRA NECK FINDINGS AORTIC ARCH: Examination technically limited by in technique. Aortic arch and origin of the great vessels not well assessed on this examination. RIGHT CAROTID SYSTEM: Visualized right common and internal carotid arteries are patent with antegrade flow. No visible dissection. Atheromatous irregularity about the right carotid bulb without hemodynamically significant greater than 50% stenosis. LEFT CAROTID SYSTEM: Visualized left common and internal carotid arteries are patent with antegrade flow. No visible dissection.  Atheromatous irregularity about the left carotid bulb without convincing hemodynamically significant greater than 50% stenosis. VERTEBRAL ARTERIES: Neither vertebral artery origin visualized on this exam. Right vertebral artery dominant. Both vertebral arteries patent with antegrade flow. No visible stenosis or dissection. Other: None. IMPRESSION: MRI HEAD: 1. No acute intracranial abnormality. 2. 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 3. Underlying age-related cerebral atrophy with mild chronic small vessel ischemic disease. MRA HEAD: 1. 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 Mild retrograde filling of the distal left V4 segment via the vertebrobasilar junction. Left PICA not well seen on this exam. 2. Otherwise negative intracranial MRA. No other large vessel occlusion or hemodynamically significant stenosis. MRA NECK: 1. No acute abnormality about the major arterial vasculature of the neck. 2. Atheromatous irregularity about the carotid bifurcations without hemodynamically significant greater than 50% stenosis. 3. Both vertebral  arteries patent within the neck. Right vertebral artery dominant. Electronically Signed   By: Morene Hoard M.D.   On: 10/06/2024 22:37   MR ANGIO HEAD WO CONTRAST Result Date: 10/06/2024 CLINICAL DATA:  Initial evaluation for acute neuro deficit, stroke suspected. EXAM: MRI HEAD WITHOUT CONTRAST MRA HEAD WITHOUT CONTRAST MRA NECK WITHOUT CONTRAST TECHNIQUE: Multiplanar, multiecho pulse sequences of the brain and surrounding structures were obtained without intravenous contrast. Angiographic images of the Circle of Willis were obtained using MRA technique without intravenous contrast. Angiographic images of the neck were obtained using MRA  technique without intravenous contrast. Carotid stenosis measurements (when applicable) are obtained utilizing NASCET criteria, using the distal internal carotid diameter as the denominator. COMPARISON:  CT from earlier the same day. FINDINGS: MRI HEAD FINDINGS Brain: Diffuse prominence of the CSF containing spaces compatible with generalized cerebral atrophy. Patchy T2/FLAIR hyperintensity involving the periventricular and deep white matter, consistent chronic small vessel ischemic disease, mild in nature. 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 Mild scattered chronic hemosiderin staining noted about a few of these chronic infarcts. Few small remote bilateral cerebellar infarcts noted. Remote lacunar infarct at the right thalamus. No evidence for acute or subacute ischemia. Gray-white matter differentiation otherwise maintained. No acute intracranial hemorrhage. Few additional small chronic micro hemorrhages noted, likely small vessel related. No mass lesion, midline shift or mass effect. No hydrocephalus or extra-axial fluid collection. Pituitary gland within normal limits. Vascular: Loss of normal flow void within the intradural left V4 segment, likely occluded. Major intracranial vascular flow voids are otherwise maintained. Skull and upper cervical spine: Craniocervical junction within normal limits. Bone marrow signal intensity overall within normal limits. No scalp soft tissue abnormality. Sinuses/Orbits: Prior bilateral ocular lens replacement. Paranasal sinuses are largely clear. Small left mastoid effusion noted, of doubtful significance. Patient appears to be intubated. Other: None. MRA HEAD FINDINGS ANTERIOR CIRCULATION: Both internal carotid arteries are patent through the siphons without significant stenosis or other abnormality. Right A1 segment patent. Left A1 hypoplastic and not well  seen. Normal anterior communicating complex. Anterior cerebral arteries patent without significant stenosis. No M1 stenosis or occlusion. No proximal MCA branch occlusion or high-grade stenosis. Distal MCA branches perfused and symmetric. POSTERIOR CIRCULATION: Dominant right vertebral artery patent to the vertebrobasilar junction without stenosis. Right PICA grossly patent at its origin. 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 Mild retrograde filling of the distal left V4 segment via the vertebrobasilar junction. Left PICA not well seen on this exam. Basilar patent without stenosis. Superior cerebellar arteries patent bilaterally. Both PCAs primarily supplied via the basilar patent to their distal aspects without significant stenosis. No intracranial aneurysm. MRA NECK FINDINGS AORTIC ARCH: Examination technically limited by in technique. Aortic arch and origin of the great vessels not well assessed on this examination. RIGHT CAROTID SYSTEM: Visualized right common and internal carotid arteries are patent with antegrade flow. No visible dissection. Atheromatous irregularity about the right carotid bulb without hemodynamically significant greater than 50% stenosis. LEFT CAROTID SYSTEM: Visualized left common and internal carotid arteries are patent with antegrade flow. No visible dissection. Atheromatous irregularity about the left carotid bulb without convincing hemodynamically significant greater than 50% stenosis. VERTEBRAL ARTERIES: Neither vertebral artery origin visualized on this exam. Right vertebral artery dominant. Both vertebral arteries patent with antegrade flow. No visible stenosis or dissection. Other: None. IMPRESSION: MRI HEAD: 1. No  acute intracranial abnormality. 2. 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 3. Underlying age-related cerebral atrophy with mild chronic small vessel ischemic disease. MRA HEAD: 1. 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 Mild retrograde filling of the distal left V4 segment via the vertebrobasilar junction. Left PICA not well seen on this exam. 2. Otherwise negative intracranial MRA. No other large vessel occlusion or hemodynamically significant stenosis. MRA NECK: 1. No acute abnormality about the major arterial vasculature of the neck. 2. Atheromatous irregularity about the carotid bifurcations without hemodynamically significant greater than 50% stenosis. 3. Both vertebral arteries patent within the neck. Right vertebral artery dominant. Electronically Signed   By: Morene Hoard M.D.   On: 10/06/2024 22:37   MR ANGIO NECK WO CONTRAST Result Date: 10/06/2024 CLINICAL DATA:  Initial evaluation for acute neuro deficit, stroke suspected. EXAM: MRI HEAD WITHOUT CONTRAST MRA HEAD WITHOUT CONTRAST MRA NECK WITHOUT CONTRAST TECHNIQUE: Multiplanar, multiecho pulse sequences of the brain and surrounding structures were obtained without intravenous contrast. Angiographic images of the Circle of Willis were obtained using MRA technique without intravenous contrast. Angiographic images of the neck were obtained using MRA technique without intravenous contrast. Carotid stenosis measurements (when applicable) are obtained utilizing NASCET criteria, using the distal internal carotid diameter as the denominator. COMPARISON:  CT from earlier the same day. FINDINGS: MRI HEAD FINDINGS Brain: Diffuse prominence of the CSF containing spaces compatible with generalized cerebral atrophy. Patchy T2/FLAIR hyperintensity involving the periventricular and deep white matter, consistent chronic small vessel ischemic disease, mild in nature. Multiple remote cortical subcortical infarcts are seen involving  the left frontal lobe, bilateral parietal lobes, left temporal and left occipital lobes. Mild scattered chronic hemosiderin staining noted about a few of these chronic infarcts. Few small remote bilateral cerebellar infarcts noted. Remote lacunar infarct at the right thalamus. No evidence for acute or subacute ischemia. Gray-white matter differentiation otherwise maintained. No acute intracranial hemorrhage. Few additional small chronic micro hemorrhages noted, likely small vessel related. No mass lesion, midline shift or mass effect. No hydrocephalus or extra-axial fluid collection. Pituitary gland within normal limits. Vascular: Loss of normal flow void within the intradural left V4 segment, likely occluded. Major intracranial vascular flow voids are otherwise maintained. Skull and upper cervical spine: Craniocervical junction within normal limits. Bone marrow signal intensity overall within normal limits. No scalp soft tissue abnormality. Sinuses/Orbits: Prior bilateral ocular lens replacement. Paranasal sinuses are largely clear. Small left mastoid effusion noted, of doubtful significance. Patient appears to be intubated. Other: None. MRA HEAD FINDINGS ANTERIOR CIRCULATION: Both internal carotid arteries are patent through the siphons without significant stenosis or other abnormality. Right A1 segment patent. Left A1 hypoplastic and not well seen. Normal anterior communicating complex. Anterior cerebral arteries patent without significant stenosis. No M1 stenosis or occlusion. No proximal MCA branch occlusion or high-grade stenosis. Distal MCA branches perfused and symmetric. POSTERIOR CIRCULATION: Dominant right vertebral artery patent to the vertebrobasilar junction without stenosis. Right PICA grossly patent at its origin. 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 Mild retrograde filling of the distal left V4 segment via the vertebrobasilar junction.  Left PICA not well seen on this exam. Basilar patent without stenosis. Superior cerebellar arteries patent bilaterally. Both PCAs primarily supplied via the basilar patent  to their distal aspects without significant stenosis. No intracranial aneurysm. MRA NECK FINDINGS AORTIC ARCH: Examination technically limited by in technique. Aortic arch and origin of the great vessels not well assessed on this examination. RIGHT CAROTID SYSTEM: Visualized right common and internal carotid arteries are patent with antegrade flow. No visible dissection. Atheromatous irregularity about the right carotid bulb without hemodynamically significant greater than 50% stenosis. LEFT CAROTID SYSTEM: Visualized left common and internal carotid arteries are patent with antegrade flow. No visible dissection. Atheromatous irregularity about the left carotid bulb without convincing hemodynamically significant greater than 50% stenosis. VERTEBRAL ARTERIES: Neither vertebral artery origin visualized on this exam. Right vertebral artery dominant. Both vertebral arteries patent with antegrade flow. No visible stenosis or dissection. Other: None. IMPRESSION: MRI HEAD: 1. No acute intracranial abnormality. 2. 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 3. Underlying age-related cerebral atrophy with mild chronic small vessel ischemic disease. MRA HEAD: 1. 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 Mild retrograde filling of the distal left V4 segment via the vertebrobasilar junction. Left PICA not well seen on this exam. 2. Otherwise negative intracranial MRA. No other large vessel occlusion or hemodynamically significant stenosis. MRA NECK: 1. No acute abnormality about the major arterial vasculature of the neck. 2.  Atheromatous irregularity about the carotid bifurcations without hemodynamically significant greater than 50% stenosis. 3. Both vertebral arteries patent within the neck. Right vertebral artery dominant. Electronically Signed   By: Morene Hoard M.D.   On: 10/06/2024 22:37   CT HEAD WO CONTRAST ( ) Result Date: 10/06/2024 EXAM: CT HEAD WITHOUT CONTRAST 10/06/2024 01:14:00 PM TECHNIQUE: CT of the head was performed without the administration of intravenous contrast. Automated exposure control, iterative reconstruction, and/or weight based adjustment of the mA/kV was utilized to reduce the radiation dose to as low as reasonably achievable. COMPARISON: 08/25/2024 CLINICAL HISTORY: Altered mental status, on Eliquis . FINDINGS: BRAIN AND VENTRICLES: No acute hemorrhage. No evidence of acute infarct. Scattered periventricular white matter hypoattenuation, consistent with mild chronic ischemic microvascular disease. Encephalomalacia in the posterior and inferior right parietal lobe compatible with remote infarct. Additional encephalomalacia involving the left middle frontal gyrus and centrum semiovale. Mild volume loss. Additional small remote cortical infarcts in the posterior aspect of the left superior temporal gyrus and within the lateral aspect of the left occipital lobe. No hydrocephalus. No extra-axial collection. No mass effect or midline shift. ORBITS: Bilateral lens replacements noted. Calcified atherosclerotic plaque within cavernous/supraclinoid internal carotid arteries. SINUSES: Trace left mastoid effusion. Mild mucosal thickening in the ethmoid sinuses and left maxillary sinus. Trace fluid layering in the nasopharynx. SOFT TISSUES AND SKULL: No acute soft tissue abnormality. No skull fracture. IMPRESSION: 1. No acute intracranial abnormality. 2. Multiple remote infarcts as above. Electronically signed by: Donnice Mania MD 10/06/2024 01:32 PM EST RP Workstation: [id number]   DG Chest Portable 1  View Result Date: 10/06/2024 CLINICAL DATA:  Altered mental status, lethargy and hypoxia. Status post intubation and placement of orogastric tube. EXAM: PORTABLE CHEST 1 VIEW COMPARISON:  Film at 0830 hours FINDINGS: Endotracheal tube present with tip approximately 4.5 cm above the carina. Orogastric tube extends below the diaphragm into the stomach. Stable significant cardiac enlargement. Stable left lower lobe atelectasis/consolidation. Potential component of left pleural fluid. No overt pulmonary edema or pneumothorax. The visualized skeletal structures are unremarkable.  IMPRESSION: Endotracheal tube tip approximately 4.5 cm above the carina. Orogastric tube extends into the stomach. Stable left lower lobe atelectasis/consolidation. Electronically Signed   By: Marcey Moan M.D.   On: 10/06/2024 13:25   DG Abdomen 1 View Result Date: 10/06/2024 CLINICAL DATA:  Gastric tube placement EXAM: ABDOMEN - 1 VIEW COMPARISON:  Chest x-ray obtained earlier today FINDINGS: The tip of the gastric tube overlies the GE junction. Dense opacification of the left lung base. Tortuous and calcified splenic artery noted incidentally. Surgical clips in the right upper quadrant. No evidence of bowel obstruction. Partially imaged lumbosacral fixation hardware. IMPRESSION: The tip of the gastric tube overlies the GE junction. Recommend advancing 5-10 cm for more optimal placement. Partially imaged dense opacification of the left lung base may reflect pleural fluid with atelectasis or infiltrate. Electronically Signed   By: Wilkie Lent M.D.   On: 10/06/2024 13:21   DG Hand Complete Left Result Date: 10/06/2024 EXAM: 3 OR MORE VIEW(S) XRAY OF THE LEFT HAND 10/06/2024 08:48:00 AM COMPARISON: 08/31/2024 CLINICAL HISTORY: 69 year old male with a wound. FINDINGS: BONES AND JOINTS: Limited evaluation of the digits because of flexion on all views. Mild degenerative changes. No acute fracture. No malalignment. SOFT TISSUES: 3 mm  metallic density along the dorsal medial aspect of the wrist, chronic and stable retained foreign body. Extensive vascular calcifications. Generalized soft tissue swelling at the proximal hand and wrist has increased since last month. IMPRESSION: 1. No acute osseous abnormality identified about the left hand. 2. Generalized proximal hand and wrist soft tissue swelling is new or increased. 3. Very severe calcified peripheral vascular disease. Electronically signed by: Helayne Hurst MD 10/06/2024 09:08 AM EST RP Workstation: [id number]   DG Hand 2 View Right Result Date: 10/06/2024 EXAM: 1 or 2 VIEW(S) XRAY OF THE RIGHT HAND 10/06/2024 08:48:00 AM COMPARISON: Right hand series 08/31/2024. CLINICAL HISTORY: 69 year old male with an extremity wound. FINDINGS: BONES AND JOINTS: No acute fracture. No malalignment. Moderate first CMC degeneration. Mild first MCP and IP osteoarthritis. SOFT TISSUES: Pulse oximeter artifact over the right 2nd finger. Incidental intravenous access artifact at the wrist. Vascular calcifications. IMPRESSION: 1. No acute osseous abnormality identified about the right hand. 2. Very severe calcified peripheral vascular disease. Electronically signed by: Helayne Hurst MD 10/06/2024 09:06 AM EST RP Workstation: [id number]   DG Chest Port 1 View Result Date: 10/06/2024 EXAM: 1 VIEW(S) XRAY OF THE CHEST 10/06/2024 08:48:00 AM COMPARISON: 09/18/2024 CLINICAL HISTORY: 69 year old male with query sepsis, increasing confusion, and lethargy for 3 days. Evaluate for abnormality. FINDINGS: LUNGS AND PLEURA: Ongoing and mildly progressed dense left lung base opacification obscuring the left hemidiaphragm. This may all be pleural effusion, but a combination of consolidation and pleural effusion is possible. Stable nearby linear platelike atelectasis. Streaky right lung base opacity also appears increased. No pneumothorax. HEART AND MEDIASTINUM: Cardiomegaly. Calcified aorta. BONES AND SOFT TISSUES: Cervical  fusion hardware noted. No acute osseous abnormality. ABDOMEN: Paucity of visible bowel gas in the abdomen. IMPRESSION: 1. Increasing dense left lung base opacity, likely in part due to pleural effusion, but also suspicious for pneumonia. Increasing streaky right lung base opacity, atelectasis vs infection. 2. Cardiomegaly. Electronically signed by: Helayne Hurst MD 10/06/2024 09:05 AM EST RP Workstation: [id number]    PMH:   Past Medical History:  Diagnosis Date   Carotid artery occlusion 11/10/2010   LEFT CAROTID ENDARTERECTOMY   Complication of anesthesia    BP WENT UP AT [facility]    COPD (chronic obstructive pulmonary disease) (HCC)  pt denies this dx as of 06/01/20 - no inhaler    Diabetes mellitus without complication (HCC)    Diverticulitis    Diverticulosis of colon (without mention of hemorrhage)    DJD (degenerative joint disease)    knees/hands/feet/back/neck   ESRD (end stage renal disease) on dialysis (HCC)    Fatty liver    Full dentures    GERD (gastroesophageal reflux disease)    H/O hiatal hernia    History of blood transfusion    with a past surical procedure per patient 06/01/20   History of right below knee amputation (HCC) 07/12/2016   Hyperlipidemia    Hypertension    Neuromuscular disorder (HCC)    peripheral neuropathy   Non-pressure chronic ulcer of other part of left foot limited to breakdown of skin (HCC) 11/12/2016   Nonrheumatic aortic (valve) stenosis 03/18/2024   Limited TTE 03/18/2024: EF 55-60, moderate LVH, normal RVSF, moderate LAE, mild MR, mild-moderate AS (mean 12, V-max 235 cm/s, DI 0.35), aortic root 39 mm, moderate pleural effusion, trivial pericardial effusion    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 Atherectomy left popliteal artery July 2012.    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   Unstable angina (HCC) 09/16/2018   Wears glasses     PSH:   Past Surgical History:  Procedure Laterality Date   ABDOMINAL AORTOGRAM W/LOWER EXTREMITY N/A 06/23/2021   Procedure: ABDOMINAL AORTOGRAM W/LOWER EXTREMITY;  Surgeon: Elmira Newman PARAS, MD;  Location: [facility];  Service: Cardiovascular;  Laterality: N/A;   AMPUTATION  11/05/2011   Procedure: AMPUTATION RAY;  Surgeon: Norleen Armor, MD;  Location: [facility] OR;  Service: Orthopedics;  Laterality: Right;  Amputation of Right 4&5th Toes   AMPUTATION Left 11/26/2012   Procedure: AMPUTATION RAY;  Surgeon: Norleen Armor, MD;  Location: [facility] OR;  Service: Orthopedics;  Laterality: Left;  fourth ray amputation   AMPUTATION Right 08/27/2014   Procedure: Transmetatarsal Amputation;  Surgeon: Jerona Harden GAILS, MD;  Location: [facility] OR;  Service: Orthopedics;  Laterality: Right;   AMPUTATION Right 01/14/2015   Procedure: AMPUTATION BELOW KNEE;  Surgeon: Jerona Harden GAILS, MD;  Location: [facility] OR;  Service: Orthopedics;  Laterality: Right;   AMPUTATION Left 10/21/2015   Procedure: Left Foot 5th Ray Amputation;  Surgeon: Jerona Harden GAILS, MD;  Location: [facility] OR;  Service: Orthopedics;  Laterality: Left;   AMPUTATION Left 07/04/2022   Procedure: LEFT BELOW KNEE AMPUTATION;  Surgeon: Harden Jerona GAILS, MD;  Location: [facility] OR;  Service: Orthopedics;  Laterality: Left;   AMPUTATION FINGER Left 09/23/2024   Procedure: AMPUTATION, FINGER;  Surgeon: Arlinda Buster, MD;  Location: [facility] OR;  Service: Orthopedics;  Laterality: Left;  LEFT RING FINGER AMPUTATION   ANTERIOR FUSION CERVICAL SPINE  02/06/2006   C4-5, C5-6, C6-7; SURGEON DR. MAX COHEN   AV FISTULA PLACEMENT Left 06/02/2020   Procedure: ARTERIOVENOUS (AV) FISTULA CREATION LEFT;  Surgeon: Sheree Penne Bruckner, MD;  Location: [facility] OR;  Service: Vascular;  Laterality: Left;    BACK SURGERY     x 3   BASCILIC VEIN TRANSPOSITION Left 07/21/2020   Procedure: LEFT UPPER ARM ATERIOVENOUS SUPERFISTULALIZATION;  Surgeon: Sheree Penne Bruckner, MD;  Location: [facility] OR;  Service: Vascular;  Laterality: Left;   BELOW KNEE LEG AMPUTATION Right    CARDIAC CATHETERIZATION  10/31/2004   2009   CAROTID ENDARTERECTOMY  11/10/2010   CAROTID ENDARTERECTOMY Left 11/10/2010   Subtotal occlusion of left internal carotid artery with left hemispheric transient ischemic attacks.   CAROTID STENT     CARPAL TUNNEL RELEASE Right 10/21/2013   Procedure: RIGHT CARPAL TUNNEL RELEASE;  Surgeon: Arley JONELLE Curia, MD;  Location: [facility];  Service: Orthopedics;  Laterality: Right;   CHOLECYSTECTOMY     COLON SURGERY     COLONOSCOPY     COLOSTOMY REVERSAL  05/21/2018   ileostomy reversal   CORONARY LITHOTRIPSY N/A 11/24/2023   Procedure: CORONARY LITHOTRIPSY;  Surgeon: Jordan, Peter M, MD;  Location: [facility] INVASIVE CV LAB;  Service: Cardiovascular;  Laterality: N/A;   CORONARY STENT INTERVENTION N/A 11/24/2023   Procedure: CORONARY STENT INTERVENTION;  Surgeon: Jordan, Peter M, MD;  Location: [facility] INVASIVE CV LAB;  Service: Cardiovascular;  Laterality: N/A;   CYSTOSCOPY WITH STENT PLACEMENT Bilateral 01/13/2018   Procedure: CYSTOSCOPY WITH BILATERAL URETERAL CATHETER PLACEMENT;  Surgeon: Cam Morene ORN, MD;  Location: [facility];  Service: Urology;  Laterality: Bilateral;   ESOPHAGEAL MANOMETRY Bilateral 07/19/2014   Procedure: ESOPHAGEAL MANOMETRY (EM);  Surgeon: Gordy CHRISTELLA Starch, MD;  Location: [facility];  Service: Gastroenterology;  Laterality: Bilateral;   EYE SURGERY Bilateral 2020   cataract   FEMORAL ARTERY STENT     x6   FINGER SURGERY     FOOT SURGERY  04/25/2016    EXCISION BASE 5TH METATARSAL AND PARTIAL CUBOID LEFT FOOT   HERNIA REPAIR     LEFT INGUINAL AND UMBILICAL REPAIRS   HERNIA REPAIR     I & D EXTREMITY Left 04/25/2016   Procedure: EXCISION BASE 5TH  METATARSAL AND PARTIAL CUBOID LEFT FOOT;  Surgeon: Jerona LULLA Sage, MD;  Location: [facility];  Service: Orthopedics;  Laterality: Left;   ILEOSTOMY  01/13/2018   Procedure: ILEOSTOMY;  Surgeon: Signe Mitzie LABOR, MD;  Location: [facility];  Service: General;;   ILEOSTOMY CLOSURE N/A 05/21/2018   Procedure: ILEOSTOMY REVERSAL ERAS PATHWAY;  Surgeon: Signe Mitzie LABOR, MD;  Location: [facility];  Service: General;  Laterality: N/A;   IR RADIOLOGIST EVAL & MGMT  11/19/2017   IR RADIOLOGIST EVAL & MGMT  12/03/2017   IR RADIOLOGIST EVAL & MGMT  12/18/2017   JOINT REPLACEMENT Right 2001   Total knee   LAMINECTOMY     X 3 LUMBAR AND X 2 CERVICAL SPINE OPERATIONS   LAPAROSCOPIC CHOLECYSTECTOMY W/ CHOLANGIOGRAPHY  11/09/2004   SURGEON DR. DEWARD CANDIE NULL   LEFT HEART CATH AND CORONARY ANGIOGRAPHY N/A 09/16/2018   Procedure: LEFT HEART CATH AND CORONARY ANGIOGRAPHY;  Surgeon: Elmira Newman PARAS, MD;  [facility]: [facility];  Service: Cardiovascular;  Laterality: N/A;   LEFT HEART CATH AND CORONARY ANGIOGRAPHY N/A 11/24/2023   Procedure: LEFT HEART CATH AND CORONARY ANGIOGRAPHY;  Surgeon: Jordan, Peter M, MD;  Location: [facility] CV [facility];  Service: Cardiovascular;  Laterality: N/A;   LEFT HEART CATHETERIZATION WITH CORONARY ANGIOGRAM N/A 10/29/2014   Procedure: LEFT HEART CATHETERIZATION WITH CORONARY ANGIOGRAM;  Surgeon: Erick JONELLE Bergamo, MD;  Location: [facility] CATH LAB;  Service: Cardiovascular;  Laterality: N/A;   LIGATION OF COMPETING BRANCHES OF ARTERIOVENOUS FISTULA Left 07/21/2020   Procedure: LIGATION OF COMPETING BRANCHES OF LEFT UPPER ARM ARTERIOVENOUS FISTULA;  Surgeon: Sheree Penne  Lonni, MD;  Location: [facility] OR;  Service: Vascular;  Laterality: Left;   LOWER EXTREMITY ANGIOGRAM N/A 03/19/2012   Procedure: LOWER EXTREMITY ANGIOGRAM;  Surgeon: Lonni JONETTA Cash, MD;  Location: [facility] CATH LAB;  Service: Cardiovascular;  Laterality: N/A;   LOWER EXTREMITY ANGIOGRAPHY N/A 06/20/2021   Procedure: LOWER  EXTREMITY ANGIOGRAPHY;  Surgeon: Elmira Newman PARAS, MD;  [facility]: [facility];  Service: Cardiovascular;  Laterality: N/A;   NECK SURGERY     PARTIAL COLECTOMY N/A 01/13/2018   Procedure: LAPAROSCOPIC ASSISTED   SIGMOID COLECTOMY ILEOSTOMY;  Surgeon: Signe Mitzie LABOR, MD;  Location: [facility];  Service: General;  Laterality: N/A;   PENILE PROSTHESIS IMPLANT  08/14/2005   INFRAPUBIC INSERTION OF INFLATABLE PENILE PROSTHESIS; SURGEON DR. JANIT   PENILE PROSTHESIS IMPLANT     PERCUTANEOUS CORONARY STENT INTERVENTION (PCI-S) Right 10/29/2014   Procedure: PERCUTANEOUS CORONARY STENT INTERVENTION (PCI-S);  Surgeon: Erick JONELLE Bergamo, MD;  Location: [facility] CATH LAB;  Service: Cardiovascular;  Laterality: Right;   PERICARDIOCENTESIS N/A 02/13/2024   Procedure: PERICARDIOCENTESIS;  Surgeon: Elmira Newman PARAS, MD;  [facility];  Service: Cardiovascular;  Laterality: N/A;   PERIPHERAL VASCULAR INTERVENTION Left 06/23/2021   Procedure: PERIPHERAL VASCULAR INTERVENTION;  Surgeon: Elmira Newman PARAS, MD;  Location: [facility];  Service: Cardiovascular;  Laterality: Left;   REMOVAL OF PENILE PROSTHESIS N/A 06/14/2021   Procedure: Removal of THREE piece inflatable penile prosthesis;  Surgeon: Carolee Sherwood JONETTA DOUGLAS, MD;  Location: [facility] OR;  Service: Urology;  Laterality: N/A;   SHOULDER ARTHROSCOPY     SPINE SURGERY     TEMPORARY PACEMAKER N/A 02/13/2024   Procedure: TEMPORARY PACEMAKER;  Surgeon: Elmira Newman PARAS, MD;  Location: [facility];  Service: Cardiovascular;  Laterality: N/A;   TOE AMPUTATION Left    TONSILLECTOMY     TOTAL KNEE ARTHROPLASTY  07/2002   RIGHT KNEE ; SURGEON  DR. [name] ALSO HAD ARTHROSCOPIC RIGHT KNEE IN  10/2001   TOTAL KNEE ARTHROPLASTY     ULNAR NERVE TRANSPOSITION Right 10/21/2013   Procedure: RIGHT ELBOW  ULNAR NERVE DECOMPRESSION;  Surgeon: Arley JONELLE Curia, MD;  Location: [facility];  Service: Orthopedics;  Laterality:  Right;    Allergies: Allergies[1]  Medications:   Prior to Admission medications  Medication Sig Start Date End Date Taking? Authorizing Provider  apixaban  (ELIQUIS ) 5 MG TABS tablet Take 1 tablet (5 mg total) by mouth 2 (two) times daily. 01/14/22 05/10/25 Yes Gonfa, Taye T, MD  atorvastatin  (LIPITOR ) 80 MG tablet Take 1 tablet (80 mg total) by mouth at bedtime. Patient taking differently: Take 80 mg by mouth every evening. 11/25/23  Yes Amoako, Prince, MD  carvedilol  (COREG ) 12.5 MG tablet Take 1 tablet (12.5 mg total) by mouth 2 (two) times daily with a meal. 09/25/24  Yes Adhikari, Ivonne, MD  colchicine  0.6 MG tablet Take 0.5 tablets (0.3 mg total) by mouth daily. Patient taking differently: Take 0.3 mg by mouth every evening. 02/21/24  Yes Kathrin Mignon DASEN, MD  CREAM BASE EX Apply 1 Application topically in the morning and at bedtime. Apply to incontinence area  PRN order: apply twice daily as needed for barrier cream   Yes [provider]  ethyl chloride spray Apply 1 Application topically See admin instructions. Apply to vascular access in the morning on Tuesday, Thursday, and Saturday. 09/14/24  Yes [provider]  famotidine  (PEPCID ) 20 MG tablet Take 20 mg by mouth at bedtime.  Yes [provider]  fentaNYL  (DURAGESIC ) 12 MCG/HR Place 1 patch onto the skin every 3 (three) days. 09/04/24  Yes Samtani, Jai-Gurmukh, MD  ferrous sulfate  325 (65 FE) MG tablet Take 325 mg by mouth every evening.   Yes [provider]  folic acid  (FOLVITE ) 1 MG tablet Take 1 mg by mouth every evening. 09/18/21  Yes [provider]  gabapentin  (NEURONTIN ) 100 MG capsule Take 100 mg by mouth 3 (three) times daily.   Yes [provider]  insulin  aspart (NOVOLOG  FLEXPEN) 100 UNIT/ML FlexPen Inject 4-12 Units into the skin See admin instructions. Inject 4 units subcutaneously three times daily before meals, in ADDITION to 0-12 units per sliding scale: 70 - 150 : 0  units 151-200 : 2 units 201-250 : 4 units 251-300 : 6 units 301-350 : 8 units 351-400 : 10 units 401-450 : 12 units > 400 : give 12 units and recheck in 1 hour - if still > 400, call MD/NP for further orders.   Yes [provider]  oxyCODONE  (OXY IR/ROXICODONE ) 5 MG immediate release tablet Take 1 tablet (5 mg total) by mouth every 6 (six) hours as needed for moderate pain (pain score 4-6) or severe pain (pain score 7-10). 09/02/24  Yes Samtani, Jai-Gurmukh, MD  Phenylephrine  HCl (PREPARATION H) 0.25 % SUPP Place 1 suppository rectally every 6 (six) hours as needed (hemorrhoids).   Yes [provider]  polyethylene glycol (MIRALAX  / GLYCOLAX ) 17 g packet Take 17 g by mouth daily as needed for moderate constipation. 09/02/24  Yes Samtani, Jai-Gurmukh, MD  rOPINIRole  (REQUIP ) 2 MG tablet Take 2 mg by mouth at bedtime.   Yes [provider]  senna (SENOKOT) 8.6 MG TABS tablet Take 17.2 mg by mouth every evening.   Yes [provider]  sevelamer  carbonate (RENVELA ) 800 MG tablet Take 1 tablet (800 mg total) by mouth 3 (three) times daily with meals. 09/02/24  Yes Samtani, Jai-Gurmukh, MD  tamsulosin  (FLOMAX ) 0.4 MG CAPS capsule Take 0.4 mg by mouth every evening.   Yes [provider]  amoxicillin  (AMOXIL ) 500 MG capsule Take 500 mg by mouth 2 (two) times daily. Patient not taking: Reported on 10/06/2024    [provider]    Discontinued Meds:   Medications Discontinued During This Encounter  Medication Reason   ceFEPIme  (MAXIPIME ) 2 g in sodium chloride  0.9 % 100 mL IVPB    vancomycin  (VANCOCIN ) IVPB 1000 mg/200 mL premix    barrier cream (NON-SPECIFIED) CREA Change in therapy   folic acid -vitamin b complex-vitamin c -selenium-zinc  (DIALYVITE ) 3 MG TABS tablet Discontinued by provider   Wound Cleansers (VASHE WOUND) SOLN Discontinued by provider   sodium chloride  0.9 % bolus 1,000 mL    Chlorhexidine  Gluconate Cloth 2 % PADS 6 each  Duplicate   etomidate  (AMIDATE ) injection    rocuronium  (ZEMURON ) injection    naloxone  (NARCAN ) injection     Social History:  reports that he has been smoking cigarettes. He has a 35 pack-year smoking history. He has never used smokeless tobacco. He reports that he does not currently use alcohol . He reports that he does not use drugs.  Family History:   Family History  Problem Relation Age of Onset   Heart disease Father        Before age 37-  CAD, BPG   Diabetes Father        Amputation   Cancer Father        PROSTATE   Hyperlipidemia Father  Hypertension Father    Heart attack Father        Triple BPG   Varicose Veins Father    Cancer Sister        Breast   Hyperlipidemia Sister    Hypertension Sister    Heart attack Brother    Colon cancer Brother    Diabetes Brother    Heart disease Brother 35       A-Fib. Before age 26   Hyperlipidemia Brother    Hypertension Brother    Hypertension Son    Arthritis Other        GRANDMOTHER   Hypertension Other        OTHER FAMILY MEMBERS    Blood pressure 114/72, pulse 74, temperature 98.8 F (37.1 C), resp. rate 19, height 6' 2 (1.88 m), weight 98.2 kg, SpO2 100%. Physical Exam: General: intubated, sedated, ill appearing male  Head: NCAT Neck: JVD not elevated. Lungs: CTA b/l anteriorly Heart: RRR with normal S1, S2.  Abdomen: Soft, non-distended with normoactive bowel sounds.  Musculoskeletal:  Strength and tone appear normal for age. Lower extremities: b/l BKAs, no edema noted Dialysis Access: Lt BCF + T/b currently w/ needles     Audreyanna Butkiewicz, LYNWOOD ORN, MD 10/07/2024, 7:47 AM      [1]  Allergies Allergen Reactions   Contrast Media [Iodinated Contrast Media] Shortness Of Breath and Other (See Comments)    Altered mental status     Ivp Dye [Iodinated Contrast Media] Anaphylaxis, Shortness Of Breath and Other (See Comments)    Altered mental status     Latex Rash    severe rash appears after the first 24 hours  of being placed   Tape Rash and Other (See Comments)    Rash after 1 day of use  Do not leave on longer then 3 days without changing

## 2024-10-07 NOTE — Progress Notes (Signed)
 LTM EEG hooked up and running - no initial skin breakdown - push button tested - [facility] monitoring.

## 2024-10-07 NOTE — Progress Notes (Addendum)
 "  NAME:  [name], MRN:  [id number], DOB:  March 21, 1956, LOS: 1 ADMISSION DATE:  10/06/2024,  CONSULTATION DATE:  10/06/24 REFERRING MD:  Warren NOVAK, PA, CHIEF COMPLAINT:  weakness   History of Present Illness:  [name] is a 69 yo M with Mhx of Tobacco abuse, Afib on eliquis , DM, OSA, COPD, HTN, HLD, CAD, PAD s/p bilateral BKA, ESRD on HD, anemia, GERD, chronic pain, and sacral decubitus presenting to ED from SNF for weakness   Of note, 3 recent hospitalization since 12/2 for volume overload, unintentional  opiate overdose, and recent 09/28/24  for metabolic encephalopathy, sepsis secondary to PNA with proteus bacteremia, and underwent amputation of left ring finger for gangrene on 1/15 discharged to SNF.  Last iHD on 10/03/24.   Sent from [facility] for lethargy, confusion, and generalized weakness for last 2-3 days, found hypoxic at 80% on RA, tachycardic, temp 100.5, febrile, and hypotensive with SBP in the 60's initially improved after 1.5 L of IVF but still altered and SBP 80-90s.  Labs with WBC 9.5, H/H 10.1/ 33, lactic 2.2> 0.8 after fluids, pBNP > 35,000, glucose 167, BUN/sCr 59/ 8, albumin  3.3, ALP 128, INR 2.9, ammonia 63, SARS/ flu/ RSV neg.  Given concern for sepsis, blood cultures sent, started on empiric cefepime  and vancomycin .  One fentanyl  12.5mcg patch was removed by ER.  PCCM consulted given altered mental status and lower Bps.     On PCCM evaluation, pt minimally responsive to noxious stimuli with more shallow respirations.  Additional fentanyl  patch found and removed from right shoulder.  S/p narcan  x 2 in ER with improvement in BP and pt starting to move more, some concern for left facial droop and not as much activity in LUE and some movements, possibly myoclonus.  Plans for stat CTH. Neuro consulted.  VBG 7.356/ 52.  Called and spoke with wife who confirmed pt was a full code for now.   Pt was intubated while in ER for airway protection prior to [facility].   Pertinent  Medical  History   Past Medical History:  Diagnosis Date   Carotid artery occlusion 11/10/2010   LEFT CAROTID ENDARTERECTOMY   Complication of anesthesia    BP WENT UP AT [facility]    COPD (chronic obstructive pulmonary disease) (HCC)    pt denies this dx as of 06/01/20 - no inhaler    Diabetes mellitus without complication (HCC)    Diverticulitis    Diverticulosis of colon (without mention of hemorrhage)    DJD (degenerative joint disease)    knees/hands/feet/back/neck   ESRD (end stage renal disease) on dialysis (HCC)    Fatty liver    Full dentures    GERD (gastroesophageal reflux disease)    H/O hiatal hernia    History of blood transfusion    with a past surical procedure per patient 06/01/20   History of right below knee amputation (HCC) 07/12/2016   Hyperlipidemia    Hypertension    Neuromuscular disorder (HCC)    peripheral neuropathy   Non-pressure chronic ulcer of other part of left foot limited to breakdown of skin (HCC) 11/12/2016   Nonrheumatic aortic (valve) stenosis 03/18/2024   Limited TTE 03/18/2024: EF 55-60, moderate LVH, normal RVSF, moderate LAE, mild MR, mild-moderate AS (mean 12, V-max 235 cm/s, DI 0.35), aortic root 39 mm, moderate pleural effusion, trivial pericardial effusion    Osteomyelitis (HCC)    left 5th metatarsal   PAD (peripheral artery disease)    Distal  aortogram June 2012. Atherectomy left popliteal artery July 2012.    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 HD in session  Objective    Blood pressure 121/78, pulse 84, temperature 98.8 F (37.1 C), resp. rate 16, height 6' 2 (1.88 m), weight 98.2 kg, SpO2 100%.    Vent Mode: PRVC FiO2 (%):  [40 %-100 %] 40 % Set Rate:  [16 bmp-18 bmp] 16 bmp Vt Set:  [650 mL] 650 mL PEEP:  [5 cmH20] 5 cmH20 Plateau Pressure:  [17 cmH20-20 cmH20] 20 cmH20   Intake/Output Summary (Last 24 hours) at 10/07/2024 0658 Last data filed at 10/07/2024 0600 Gross per 24 hour  Intake 2342.92 ml  Output --  Net 2342.92 ml   Filed Weights   10/06/24 1600 10/07/24 0503  Weight: 89.5 kg 98.2 kg    Examination: General: Elderly main with ET tube in NAD HENT: ET tube present. Yellow secretions without plugging. Thin.  Lungs: Vented. Warehouse manager. No crackles Cardiovascular: irregular rate Abdomen: Protuberant without overt tenderness to deep palpation. +BS Extremities: BL BKA; warm. Non edematous Neuro: +gag. Moving upper extremities volitionally. No following directions Skin: multiple RUE abrasions, some bleeding with new covers. L hand with sutures over 4th digit (see below  L hand wound:   MRA 1/28 MRA HEAD:  1. 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 Mild retrograde filling of the distal left V4 segment via the vertebrobasilar junction. Left PICA not well seen on this exam. 2. Otherwise negative intracranial MRA. No other large vessel occlusion or hemodynamically significant stenosis.   MRA NECK: 1. No acute abnormality about the major arterial vasculature of the neck. 2. Atheromatous irregularity about the carotid bifurcations without hemodynamically significant greater than 50% stenosis. 3. Both vertebral arteries patent within the neck. Right vertebral artery dominant.    Resolved problem list   Assessment and Plan   Acute metabolic vs toxic encephalopathy Chronic pain management; ?unintentional overdose ?Neurontin   toxicity in ESRD S/p removal of 2 fentanyl  patches and Narcan . Pt with multiple centrally acting medications More awake today. Suspect toxic encephalopathy from chronic pain manage. Empirically treated for respiratory vs skin infections below; will follow cultures/ if there is a new developing bacteremia with cultures now growing GPC, which likely is a contaminant vs normal oral flora. Currently pt is HDS and without leukocytosis.  - Neurology consulted; on continuous EEG to r/o seizure activity - No acute infarct on MRI or CT head - Lactulose  BID  Sepsis due to LLL Pneumonia on CXR Multiple skin infections Mechanical ventilation Hx COPD and OSA Hx tobacco use disorder MRSA+. Initial resp panel negative. Recent 1/10 hx of proteus bacteremia.  LLL consolidation similar to 1/10 admission; transitioned to Amoxicillin  until 1/20. New L pleural effusion. Low suspicion this is driving the current state but will keep antibiotics for unknown source - Follow up 1/28 tracheal aspirate cultures: Gram + cocci present -  Follow up 1/28 Bcx: NGTD - On Van and zosyn  - Vent bundle - Daily SAT SBT - Continue vent support, wean off as tolerated to maintain sats> 92%  - Duonebs  Decubitus ulcers Multiple abrasions and wound on R hand L 4th finger gragrene s/pamputation. Was to see ortho on 1/21; looks infected and with poor healing (see picture above). L hand XR on admission with increased hand and wrist swlling without osseous abnormality - Other wounds as described upon 1/16 discharge - No increase in WBC, perhaps PAD protective with poor wound healing - Once patient is stable may need MRI and orthopedic surgery to re-eval prior to discharge - Ortho consulted; expected delayed healing with his underlying PAD. Recommend daily dry dressing changes for now   ESRD on iHD TTS Metabolic acidosis without hyperlactate Hyperkalemia Last HD on 1/24. Fistula on the LUE. - HD today - K management per HD -  Appreciate Nephrology's  care  Chronic pain management Restless leg syndrome PTA fentanyl  125 mcg/HR patches q 3 days. Gabapentin  100 mg TID, Oxycodone  q6HR PRN - Fentanyl  PRN - Holding ropinirole  - Hold gabapentin  and oxycodone   DM Last A1c 6.9 - SSI  Hx of remote CVA LUE weakness on presentation without evidence of acute infarct - statin  HFmrEF Mild RV failure Moderate AS Last TTE 08/2024 LVEF 50 to 55%. proBNP at his baseline. Lactic acid wnl. No low output state  Afib on Eliquis  - On therapeutic heparin   CAD Severe PAD s/p BL BKA - Continue statin  GI NG/OG tube present GERD - PPI  Anemia of ESRD Stable >12  GOC Admitting provider discussed with wife; full code  Labs   CBC: Recent Labs  Lab 10/06/24 0809 10/06/24 0845 10/06/24 1215 10/06/24 1323 10/07/24 0324  WBC 9.5  --   --   --  9.5  NEUTROABS 7.4  --   --   --   --   HGB 10.1* 10.5* 11.6* 11.2* 12.4*  HCT 33.0* 31.0* 34.0* 33.0* 39.2  MCV 95.9  --   --   --  93.1  PLT 164  --   --   --  167    Basic Metabolic Panel: Recent Labs  Lab 10/06/24 0809 10/06/24 0845 10/06/24 1215 10/06/24 1323 10/06/24 1455 10/07/24 0324  NA 141 141 142 140 143 141  K 4.9 4.6 4.8 4.6 4.8 5.6*  CL 99 100  --   --  100 96*  CO2 25  --   --   --  24 19*  GLUCOSE 167* 160*  --   --  158* 170*  BUN 59* 54*  --   --  60* 70*  CREATININE 8.04* 8.40*  --   --  7.57* 8.23*  CALCIUM  8.7*  --   --   --  8.1* 8.7*  MG  --   --   --   --  2.2  --   PHOS  --   --   --   --   --  5.4*   GFR: Estimated Creatinine Clearance: 9.7 mL/min (A) (by C-G formula based on SCr of 8.23 mg/dL (H)). Recent Labs  Lab 10/06/24 0809 10/06/24 0846 10/06/24 1059 10/06/24 1217 10/07/24 0324  WBC 9.5  --   --   --  9.5  LATICACIDVEN  --  2.2* 0.8 1.2  --     Liver Function Tests: Recent Labs  Lab 10/06/24 0809 10/07/24 0324  AST 19  --   ALT  13  --   ALKPHOS 128*  --   BILITOT 0.7  --   PROT 5.9*  --   ALBUMIN  3.3*  3.3*   No results for input(s): LIPASE, AMYLASE in the last 168 hours. Recent Labs  Lab 10/06/24 1053  AMMONIA 63*    ABG    Component Value Date/Time   PHART 7.444 10/06/2024 1323   PCO2ART 37.2 10/06/2024 1323   PO2ART 414 (H) 10/06/2024 1323   HCO3 25.5 10/06/2024 1323   TCO2 27 10/06/2024 1323   ACIDBASEDEF 1.0 09/19/2024 0345   O2SAT 100 10/06/2024 1323     Coagulation Profile: Recent Labs  Lab 10/06/24 0809  INR 2.9*    Cardiac Enzymes: No results for input(s): CKTOTAL, CKMB, CKMBINDEX, TROPONINI in the last 168 hours.  HbA1C: Hgb A1c MFr Bld  Date/Time Value Ref Range Status  08/28/2024 05:50 AM 6.9 (H) 4.8 - 5.6 % Final    Comment:    (NOTE) Diagnosis of Diabetes The following HbA1c ranges recommended by the American Diabetes Association (ADA) may be used as an aid in the diagnosis of diabetes mellitus.  Hemoglobin             Suggested A1C NGSP%              Diagnosis  <5.7                   Non Diabetic  5.7-6.4                Pre-Diabetic  >6.4                   Diabetic  <7.0                   Glycemic control for                       adults with diabetes.    08/12/2024 02:14 AM 6.4 (H) 4.8 - 5.6 % Final    Comment:    (NOTE)         Prediabetes: 5.7 - 6.4         Diabetes: >6.4         Glycemic control for adults with diabetes: <7.0     CBG: Recent Labs  Lab 10/06/24 1208 10/06/24 1534 10/06/24 1957 10/06/24 2342 10/07/24 0331  GLUCAP 138* 156* 158* 153* 160*    Review of Systems:     Past Medical History:  He,  has a past medical history of Carotid artery occlusion (11/10/2010), Complication of anesthesia, COPD (chronic obstructive pulmonary disease) (HCC), Diabetes mellitus without complication (HCC), Diverticulitis, Diverticulosis of colon (without mention of hemorrhage), DJD (degenerative joint disease), ESRD (end stage renal disease) on dialysis (HCC), Fatty liver, Full dentures, GERD (gastroesophageal  reflux disease), H/O hiatal hernia, History of blood transfusion, History of right below knee amputation (HCC) (07/12/2016), Hyperlipidemia, Hypertension, Neuromuscular disorder (HCC), Non-pressure chronic ulcer of other part of left foot limited to breakdown of skin (HCC) (11/12/2016), Nonrheumatic aortic (valve) stenosis (03/18/2024), Osteomyelitis (HCC), PAD (peripheral artery disease), Pseudoclaudication (11/15/2018), S/P BKA (below knee amputation), right (HCC) (09/18/2018), Sleep apnea, Slurred speech, Status post reversal of ileostomy (05/21/2018), 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   Surgical History:   Past Surgical History:  Procedure Laterality Date   ABDOMINAL AORTOGRAM W/LOWER EXTREMITY N/A 06/23/2021   Procedure: ABDOMINAL AORTOGRAM W/LOWER EXTREMITY;  Surgeon: Elmira Newman PARAS, MD;  [facility]: [facility]VASIVE  [facility] LAB;  Service: Cardiovascular;  Laterality: N/A;   AMPUTATION  11/05/2011   Procedure: AMPUTATION RAY;  Surgeon: Norleen Armor, MD;  Location: [facility] OR;  Service: Orthopedics;  Laterality: Right;  Amputation of Right 4&5th Toes   AMPUTATION Left 11/26/2012   Procedure: AMPUTATION RAY;  Surgeon: Norleen Armor, MD;  Location: [facility] OR;  Service: Orthopedics;  Laterality: Left;  fourth ray amputation   AMPUTATION Right 08/27/2014   Procedure: Transmetatarsal Amputation;  Surgeon: Jerona Harden GAILS, MD;  Location: [facility] OR;  Service: Orthopedics;  Laterality: Right;   AMPUTATION Right 01/14/2015   Procedure: AMPUTATION BELOW KNEE;  Surgeon: Jerona Harden GAILS, MD;  Location: [facility] OR;  Service: Orthopedics;  Laterality: Right;   AMPUTATION Left 10/21/2015   Procedure: Left Foot 5th Ray Amputation;  Surgeon: Jerona Harden GAILS, MD;  Location: [facility] OR;  Service: Orthopedics;  Laterality: Left;   AMPUTATION Left 07/04/2022   Procedure: LEFT BELOW KNEE AMPUTATION;  Surgeon: Harden Jerona GAILS, MD;  Location: [facility] OR;  Service: Orthopedics;  Laterality: Left;    AMPUTATION FINGER Left 09/23/2024   Procedure: AMPUTATION, FINGER;  Surgeon: Arlinda Buster, MD;  Location: [facility] OR;  Service: Orthopedics;  Laterality: Left;  LEFT RING FINGER AMPUTATION   ANTERIOR FUSION CERVICAL SPINE  02/06/2006   C4-5, C5-6, C6-7; SURGEON DR. MAX COHEN   AV FISTULA PLACEMENT Left 06/02/2020   Procedure: ARTERIOVENOUS (AV) FISTULA CREATION LEFT;  Surgeon: Sheree Penne Bruckner, MD;  Location: [facility] OR;  Service: Vascular;  Laterality: Left;   BACK SURGERY     x 3   BASCILIC VEIN TRANSPOSITION Left 07/21/2020   Procedure: LEFT UPPER ARM ATERIOVENOUS SUPERFISTULALIZATION;  Surgeon: Sheree Penne Bruckner, MD;  Location: [facility] OR;  Service: Vascular;  Laterality: Left;   BELOW KNEE LEG AMPUTATION Right    CARDIAC CATHETERIZATION  10/31/2004   2009   CAROTID ENDARTERECTOMY  11/10/2010   CAROTID ENDARTERECTOMY Left 11/10/2010   Subtotal occlusion of left internal carotid artery with left hemispheric transient ischemic attacks.   CAROTID STENT     CARPAL TUNNEL RELEASE Right 10/21/2013   Procedure: RIGHT CARPAL TUNNEL RELEASE;  Surgeon: Arley JONELLE Curia, MD;  Location: [facility];  Service: Orthopedics;  Laterality: Right;   CHOLECYSTECTOMY     COLON SURGERY     COLONOSCOPY     COLOSTOMY REVERSAL  05/21/2018   ileostomy reversal   CORONARY LITHOTRIPSY N/A 11/24/2023   Procedure: CORONARY LITHOTRIPSY;  Surgeon: Jordan, Peter M, MD;  Location: [facility] INVASIVE CV LAB;  Service: Cardiovascular;  Laterality: N/A;   CORONARY STENT INTERVENTION N/A 11/24/2023   Procedure: CORONARY STENT INTERVENTION;  Surgeon: Jordan, Peter M, MD;  Location: [facility] INVASIVE CV LAB;  Service: Cardiovascular;  Laterality: N/A;   CYSTOSCOPY WITH STENT PLACEMENT Bilateral 01/13/2018   Procedure: CYSTOSCOPY WITH BILATERAL URETERAL CATHETER PLACEMENT;  Surgeon: Cam Morene ORN, MD;  Location: [facility] ORS;  Service: Urology;  Laterality: Bilateral;   ESOPHAGEAL MANOMETRY Bilateral 07/19/2014    Procedure: ESOPHAGEAL MANOMETRY (EM);  Surgeon: Gordy CHRISTELLA Starch, MD;  Location: [facility];  Service: Gastroenterology;  Laterality: Bilateral;   EYE SURGERY Bilateral 2020   cataract   FEMORAL ARTERY STENT     x6   FINGER SURGERY     FOOT SURGERY  04/25/2016    EXCISION BASE 5TH METATARSAL AND PARTIAL CUBOID LEFT FOOT   HERNIA REPAIR     LEFT INGUINAL AND UMBILICAL REPAIRS   HERNIA REPAIR     I & D EXTREMITY Left 04/25/2016  Procedure: EXCISION BASE 5TH METATARSAL AND PARTIAL CUBOID LEFT FOOT;  Surgeon: Jerona LULLA Sage, MD;  Location: [facility] OR;  Service: Orthopedics;  Laterality: Left;   ILEOSTOMY  01/13/2018   Procedure: ILEOSTOMY;  Surgeon: Signe Mitzie LABOR, MD;  Location: [facility] ORS;  Service: General;;   ILEOSTOMY CLOSURE N/A 05/21/2018   Procedure: ILEOSTOMY REVERSAL ERAS PATHWAY;  Surgeon: Signe Mitzie LABOR, MD;  Location: [facility] OR;  Service: General;  Laterality: N/A;   IR RADIOLOGIST EVAL & MGMT  11/19/2017   IR RADIOLOGIST EVAL & MGMT  12/03/2017   IR RADIOLOGIST EVAL & MGMT  12/18/2017   JOINT REPLACEMENT Right 2001   Total knee   LAMINECTOMY     X 3 LUMBAR AND X 2 CERVICAL SPINE OPERATIONS   LAPAROSCOPIC CHOLECYSTECTOMY W/ CHOLANGIOGRAPHY  11/09/2004   SURGEON DR. DEWARD CANDIE NULL   LEFT HEART CATH AND CORONARY ANGIOGRAPHY N/A 09/16/2018   Procedure: LEFT HEART CATH AND CORONARY ANGIOGRAPHY;  Surgeon: Elmira Newman PARAS, MD;  [facility];  Service: Cardiovascular;  Laterality: N/A;   LEFT HEART CATH AND CORONARY ANGIOGRAPHY N/A 11/24/2023   Procedure: LEFT HEART CATH AND CORONARY ANGIOGRAPHY;  Surgeon: Jordan, Peter M, MD;  Location: [facility] INVASIVE CV LAB;  Service: Cardiovascular;  Laterality: N/A;   LEFT HEART CATHETERIZATION WITH CORONARY ANGIOGRAM N/A 10/29/2014   Procedure: LEFT HEART CATHETERIZATION WITH CORONARY ANGIOGRAM;  Surgeon: Erick JONELLE Bergamo, MD;  Location: [facility] CATH LAB;  Service: Cardiovascular;  Laterality: N/A;   LIGATION OF COMPETING BRANCHES OF  ARTERIOVENOUS FISTULA Left 07/21/2020   Procedure: LIGATION OF COMPETING BRANCHES OF LEFT UPPER ARM ARTERIOVENOUS FISTULA;  Surgeon: Sheree Penne Bruckner, MD;  Location: [facility] OR;  Service: Vascular;  Laterality: Left;   LOWER EXTREMITY ANGIOGRAM N/A 03/19/2012   Procedure: LOWER EXTREMITY ANGIOGRAM;  Surgeon: Bruckner JONETTA Cash, MD;  Location: [facility] CATH LAB;  Service: Cardiovascular;  Laterality: N/A;   LOWER EXTREMITY ANGIOGRAPHY N/A 06/20/2021   Procedure: LOWER EXTREMITY ANGIOGRAPHY;  Surgeon: Elmira Newman PARAS, MD;  [facility];  Service: Cardiovascular;  Laterality: N/A;   NECK SURGERY     PARTIAL COLECTOMY N/A 01/13/2018   Procedure: LAPAROSCOPIC ASSISTED   SIGMOID COLECTOMY ILEOSTOMY;  Surgeon: Signe Mitzie LABOR, MD;  Location: [facility] ORS;  Service: General;  Laterality: N/A;   PENILE PROSTHESIS IMPLANT  08/14/2005   INFRAPUBIC INSERTION OF INFLATABLE PENILE PROSTHESIS; SURGEON DR. JANIT   PENILE PROSTHESIS IMPLANT     PERCUTANEOUS CORONARY STENT INTERVENTION (PCI-S) Right 10/29/2014   Procedure: PERCUTANEOUS CORONARY STENT INTERVENTION (PCI-S);  Surgeon: Erick JONELLE Bergamo, MD;  Location: [facility] CATH LAB;  Service: Cardiovascular;  Laterality: Right;   PERICARDIOCENTESIS N/A 02/13/2024   Procedure: PERICARDIOCENTESIS;  Surgeon: Elmira Newman PARAS, MD;  [facility];  Service: Cardiovascular;  Laterality: N/A;   PERIPHERAL VASCULAR INTERVENTION Left 06/23/2021   Procedure: PERIPHERAL VASCULAR INTERVENTION;  Surgeon: Elmira Newman PARAS, MD;  [facility];  Service: Cardiovascular;  Laterality: Left;   REMOVAL OF PENILE PROSTHESIS N/A 06/14/2021   Procedure: Removal of THREE piece inflatable penile prosthesis;  Surgeon: Carolee Sherwood JONETTA DOUGLAS, MD;  Location: [facility] OR;  Service: Urology;  Laterality: N/A;   SHOULDER ARTHROSCOPY     SPINE SURGERY     TEMPORARY PACEMAKER N/A 02/13/2024   Procedure: TEMPORARY PACEMAKER;  Surgeon: Elmira Newman PARAS, MD;  Location: [facility];  Service: Cardiovascular;  Laterality: N/A;   TOE AMPUTATION Left    TONSILLECTOMY  TOTAL KNEE ARTHROPLASTY  07/2002   RIGHT KNEE ; SURGEON  DR. [name] ALSO HAD ARTHROSCOPIC RIGHT KNEE IN  10/2001   TOTAL KNEE ARTHROPLASTY     ULNAR NERVE TRANSPOSITION Right 10/21/2013   Procedure: RIGHT ELBOW  ULNAR NERVE DECOMPRESSION;  Surgeon: Arley JONELLE Curia, MD;  Location: [facility];  Service: Orthopedics;  Laterality: Right;     Social History:   reports that he has been smoking cigarettes. He has a 35 pack-year smoking history. He has never used smokeless tobacco. He reports that he does not currently use alcohol . He reports that he does not use drugs.   Family History:  His family history includes Arthritis in an other family member; Cancer in his father and sister; Colon cancer in his brother; Diabetes in his brother and father; Heart attack in his brother and father; Heart disease in his father; Heart disease (age of onset: 71) in his brother; Hyperlipidemia in his brother, father, and sister; Hypertension in his brother, father, sister, son, and another family member; Varicose Veins in his father.   Allergies Allergies[1]   Home Medications  Prior to Admission medications  Medication Sig Start Date End Date Taking? Authorizing Provider  apixaban  (ELIQUIS ) 5 MG TABS tablet Take 1 tablet (5 mg total) by mouth 2 (two) times daily. 01/14/22 05/10/25 Yes Gonfa, Taye T, MD  atorvastatin  (LIPITOR ) 80 MG tablet Take 1 tablet (80 mg total) by mouth at bedtime. Patient taking differently: Take 80 mg by mouth every evening. 11/25/23  Yes Amoako, Prince, MD  carvedilol  (COREG ) 12.5 MG tablet Take 1 tablet (12.5 mg total) by mouth 2 (two) times daily with a meal. 09/25/24  Yes Adhikari, Ivonne, MD  colchicine  0.6 MG tablet Take 0.5 tablets (0.3 mg total) by mouth daily. Patient taking differently: Take 0.3 mg by mouth every evening. 02/21/24  Yes Kathrin Mignon DASEN,  MD  CREAM BASE EX Apply 1 Application topically in the morning and at bedtime. Apply to incontinence area  PRN order: apply twice daily as needed for barrier cream   Yes [provider]  ethyl chloride spray Apply 1 Application topically See admin instructions. Apply to vascular access in the morning on Tuesday, Thursday, and Saturday. 09/14/24  Yes [provider]  famotidine  (PEPCID ) 20 MG tablet Take 20 mg by mouth at bedtime.   Yes [provider]  fentaNYL  (DURAGESIC ) 12 MCG/HR Place 1 patch onto the skin every 3 (three) days. 09/04/24  Yes Samtani, Jai-Gurmukh, MD  ferrous sulfate  325 (65 FE) MG tablet Take 325 mg by mouth every evening.   Yes [provider]  folic acid  (FOLVITE ) 1 MG tablet Take 1 mg by mouth every evening. 09/18/21  Yes [provider]  gabapentin  (NEURONTIN ) 100 MG capsule Take 100 mg by mouth 3 (three) times daily.   Yes [provider]  insulin  aspart (NOVOLOG  FLEXPEN) 100 UNIT/ML FlexPen Inject 4-12 Units into the skin See admin instructions. Inject 4 units subcutaneously three times daily before meals, in ADDITION to 0-12 units per sliding scale: 70 - 150 : 0 units 151-200 : 2 units 201-250 : 4 units 251-300 : 6 units 301-350 : 8 units 351-400 : 10 units 401-450 : 12 units > 400 : give 12 units and recheck in 1 hour - if still > 400, call MD/NP for further orders.   Yes [provider]  oxyCODONE  (OXY IR/ROXICODONE ) 5 MG immediate release tablet Take 1 tablet (5 mg total) by mouth every 6 (  six) hours as needed for moderate pain (pain score 4-6) or severe pain (pain score 7-10). 09/02/24  Yes Samtani, Jai-Gurmukh, MD  Phenylephrine  HCl (PREPARATION H) 0.25 % SUPP Place 1 suppository rectally every 6 (six) hours as needed (hemorrhoids).   Yes [provider]  polyethylene glycol (MIRALAX  / GLYCOLAX ) 17 g packet Take 17 g by mouth daily as needed for moderate constipation. 09/02/24  Yes Samtani,  Jai-Gurmukh, MD  rOPINIRole  (REQUIP ) 2 MG tablet Take 2 mg by mouth at bedtime.   Yes [provider]  senna (SENOKOT) 8.6 MG TABS tablet Take 17.2 mg by mouth every evening.   Yes [provider]  sevelamer  carbonate (RENVELA ) 800 MG tablet Take 1 tablet (800 mg total) by mouth 3 (three) times daily with meals. 09/02/24  Yes Samtani, Jai-Gurmukh, MD  tamsulosin  (FLOMAX ) 0.4 MG CAPS capsule Take 0.4 mg by mouth every evening.   Yes [provider]  amoxicillin  (AMOXIL ) 500 MG capsule Take 500 mg by mouth 2 (two) times daily. Patient not taking: Reported on 10/06/2024    [provider]     Critical care time:                [1]  Allergies Allergen Reactions   Contrast Media [Iodinated Contrast Media] Shortness Of Breath and Other (See Comments)    Altered mental status     Ivp Dye [Iodinated Contrast Media] Anaphylaxis, Shortness Of Breath and Other (See Comments)    Altered mental status     Latex Rash    severe rash appears after the first 24 hours of being placed   Tape Rash and Other (See Comments)    Rash after 1 day of use  Do not leave on longer then 3 days without changing   "

## 2024-10-07 NOTE — Progress Notes (Signed)
 PHARMACY - PHYSICIAN COMMUNICATION CRITICAL VALUE ALERT - BLOOD CULTURE IDENTIFICATION (BCID)  [name] is an 69 y.o. male who presented to [facility] on 10/06/2024 with a chief complaint of AMS and hypoxia. Appears current infectious workup is for sepsis with concerns for possible pneumonia.   Assessment:  GPC clusters 1/4 bottles (anaerobic), BCID = Staph epi with resistance detected (MRSE) - likely contaminant  - Infectious markers appear numerically stable (afebrile, wbc wnl)  Name of physician (or Provider) Contacted: Powell Blush, PharmD (critical care pharmacist rounding with CCM team)  Current antibiotics: vancomycin , piperacillin nadine  Changes to prescribed antibiotics recommended:  - No changes at this time given this is likely a contaminant - Continue to monitor cultures and de-escalate when appropriate as infectious workup continues  Results for orders placed or performed during the hospital encounter of 09/18/24  Blood Culture ID Panel (Reflexed) (Collected: 09/18/2024  8:19 PM)  Result Value Ref Range   Enterococcus faecalis NOT DETECTED NOT DETECTED   Enterococcus Faecium NOT DETECTED NOT DETECTED   Listeria monocytogenes NOT DETECTED NOT DETECTED   Staphylococcus species NOT DETECTED NOT DETECTED   Staphylococcus aureus (BCID) NOT DETECTED NOT DETECTED   Staphylococcus epidermidis NOT DETECTED NOT DETECTED   Staphylococcus lugdunensis NOT DETECTED NOT DETECTED   Streptococcus species NOT DETECTED NOT DETECTED   Streptococcus agalactiae NOT DETECTED NOT DETECTED   Streptococcus pneumoniae NOT DETECTED NOT DETECTED   Streptococcus pyogenes NOT DETECTED NOT DETECTED   A.calcoaceticus-baumannii NOT DETECTED NOT DETECTED   Bacteroides fragilis NOT DETECTED NOT DETECTED   Enterobacterales DETECTED (A) NOT DETECTED   Enterobacter cloacae complex NOT DETECTED NOT DETECTED   Escherichia coli NOT DETECTED NOT DETECTED   Klebsiella aerogenes NOT DETECTED NOT  DETECTED   Klebsiella oxytoca NOT DETECTED NOT DETECTED   Klebsiella pneumoniae NOT DETECTED NOT DETECTED   Proteus species DETECTED (A) NOT DETECTED   Salmonella species NOT DETECTED NOT DETECTED   Serratia marcescens NOT DETECTED NOT DETECTED   Haemophilus influenzae NOT DETECTED NOT DETECTED   Neisseria meningitidis NOT DETECTED NOT DETECTED   Pseudomonas aeruginosa NOT DETECTED NOT DETECTED   Stenotrophomonas maltophilia NOT DETECTED NOT DETECTED   Candida albicans NOT DETECTED NOT DETECTED   Candida auris NOT DETECTED NOT DETECTED   Candida glabrata NOT DETECTED NOT DETECTED   Candida krusei NOT DETECTED NOT DETECTED   Candida parapsilosis NOT DETECTED NOT DETECTED   Candida tropicalis NOT DETECTED NOT DETECTED   Cryptococcus neoformans/gattii NOT DETECTED NOT DETECTED   CTX-M ESBL NOT DETECTED NOT DETECTED   Carbapenem resistance IMP NOT DETECTED NOT DETECTED   Carbapenem resistance KPC NOT DETECTED NOT DETECTED   Carbapenem resistance NDM NOT DETECTED NOT DETECTED   Carbapenem resist OXA 48 LIKE NOT DETECTED NOT DETECTED   Carbapenem resistance VIM NOT DETECTED NOT DETECTED    [name] Close 10/07/2024  9:17 AM

## 2024-10-07 NOTE — Progress Notes (Signed)
 VENTILATOR WEAN NOTE 10/07/2024  Start Mode: PRVC  Wean Mode: Pressure Support  Duration before failure: 2 minutes  Reason for failure: Other: Low VTs and MVe  Notes: RT attempted vent wean PS/CPAP 17/5. Patient has low VTs and MVe. Patient placed back on full vent support. MD notified.

## 2024-10-07 NOTE — Progress Notes (Signed)
 LTM VIDEO EEG discontinued - no skin breakdown at [facility].

## 2024-10-07 NOTE — Progress Notes (Signed)
 Initial Nutrition Assessment  DOCUMENTATION CODES:  Non-severe (moderate) malnutrition in context of chronic illness  INTERVENTION:  Initiate tube feeding via OGT: Nepro at 50 ml/h (1200 ml per day) Start at 20 and advance by 10mL every 8 hours to reach goal Prosource TF20 60 ml 1x/d Provides 2204 kcal, 117 gm protein, 872 ml free water  daily Renal MVI daily  NUTRITION DIAGNOSIS:  Moderate Malnutrition related to acute illness, chronic illness as evidenced by moderate muscle depletion, mild fat depletion.  GOAL:  Patient will meet greater than or equal to 90% of their needs  MONITOR:  TF tolerance, Vent status, Labs, I & O's  REASON FOR ASSESSMENT:  Ventilator, Consult Enteral/tube feeding initiation and management  ASSESSMENT:  Pt with hx of CAD s/p bilateral BKAs, CHF, atrial fibrillation, DM type 2, COPD, HTN, HLD, and ESRD on HD presented to ED from his SNF with AMS and hypoxia. Workup consistent with sepsis and unintentional opiate overdose.  Recent admissions: 12/16-12/24, 1/9-1/16  1/27 - presented to ED, intubated due to mentation  Patient is currently intubated on ventilator support, no family present at the time of assessment to provide a history. Discussed with resident, attempting to wean from ventilator, but likely will not be today. Ok to start TF. Noted that pt's weight is under his EDW and he has also lost a significant amount in the last month, however some of this may be related to fluid.   MV: 9.6 L/min Temp (24hrs), Avg:99.1 F (37.3 C), Min:97.9 F (36.6 C), Max:99.9 F (37.7 C) MAP (cuff): 70-146mmHg  Admit weight: 89.5 kg   Current weight: 97.9 kg  EDW 106kg  14% weight loss x 1 month, severe 7.8% down from EDW    Intake/Output Summary (Last 24 hours) at 10/07/2024 1535 Last data filed at 10/07/2024 1500 Gross per 24 hour  Intake 1327.06 ml  Output 0 ml  Net 1327.06 ml  Net IO Since Admission: 2,892.62 mL [10/07/24 1535]  Drains/Lines: AV  Fistula. left upper arm OGT  Nutritionally Relevant Medications: Scheduled Meds:  atorvastatin   80 mg Per Tube Daily   lactulose   10 g Per Tube BID   pantoprazole  IV  40 mg Intravenous Daily   Continuous Infusions:  piperacillin -tazobactam (ZOSYN )  IV Stopped (10/06/24 2232)   vancomycin      Labs Reviewed: Potassium 5.6 Chloride 96 BUN 70, creatinine 8.23 Phosphorus 5.4 CBG ranges from 135-160 mg/dL over the last 24 hours HgbA1c 6.9%  NUTRITION - FOCUSED PHYSICAL EXAM: Flowsheet Row Most Recent Value  Orbital Region Mild depletion  Upper Arm Region No depletion  Thoracic and Lumbar Region No depletion  Buccal Region Mild depletion  Temple Region Mild depletion  Clavicle Bone Region Moderate depletion  Clavicle and Acromion Bone Region Moderate depletion  Scapular Bone Region Moderate depletion  Dorsal Hand Unable to assess  Patellar Region No depletion  Anterior Thigh Region No depletion  Posterior Calf Region Unable to assess  [bilateral BKA]  Edema (RD Assessment) Mild  Hair Reviewed  Eyes Reviewed  Mouth Reviewed  Skin Reviewed  Nails Unable to assess    Diet Order:   Diet Order             Diet NPO time specified  Diet effective now                   EDUCATION NEEDS:  Not appropriate for education at this time  Skin:  Skin Assessment: Skin Integrity Issues: Skin Tear - right arm (2 x  2 x 0.5 cm) Surgical incision: left hand ring finger, recent amputation Stage 2: sacrum (7 x 6 cm)  Last BM:  1/27  Height:  Ht Readings from Last 1 Encounters:  10/06/24 6' 2 (1.88 m)   Weight:  Wt Readings from Last 1 Encounters:  10/07/24 97.9 kg    Ideal Body Weight:  76.2 kg (adjusted by 11.8 for bilateral BKA)  BMI:  Body mass index is 31.4 kg/m. (using 1/28 wt)  Estimated Nutritional Needs:  Kcal:  2100-2300 kcal/d Protein:  110-125g/d Fluid:  1L+UOP    Vernell Lukes, RD, LDN, CNSC Registered Dietitian II Please reach out via secure  chat

## 2024-10-07 NOTE — Progress Notes (Signed)
 eLink Physician-Brief Progress Note Patient Name: [name] DOB: 1956/01/10 MRN: [id number]   Date of Service  10/07/2024  HPI/Events of Note  Patient complaining of 7 out of 10 pain. Not cleared to take oral meds. He has prn fentynl but this was from when he was intubated so orders need to be changed.   He does take Fentanyl  12 mcg/hr patch for chronic pains but there was concern for overdose as multiple patches seen on him on presentation.  eICU Interventions  Ordered Fentanyl  25 mcg q 3 hours for severe pain. Watch out for signs of decreased mental status and respiratory depression. Discussed with BSRN     Intervention Category Intermediate Interventions: Pain - evaluation and management  [name] 10/07/2024, 9:59 PM

## 2024-10-08 ENCOUNTER — Inpatient Hospital Stay (HOSPITAL_COMMUNITY)

## 2024-10-08 ENCOUNTER — Encounter (HOSPITAL_COMMUNITY): Payer: Self-pay

## 2024-10-08 DIAGNOSIS — E1122 Type 2 diabetes mellitus with diabetic chronic kidney disease: Secondary | ICD-10-CM | POA: Diagnosis not present

## 2024-10-08 DIAGNOSIS — I132 Hypertensive heart and chronic kidney disease with heart failure and with stage 5 chronic kidney disease, or end stage renal disease: Secondary | ICD-10-CM | POA: Diagnosis not present

## 2024-10-08 DIAGNOSIS — N186 End stage renal disease: Secondary | ICD-10-CM | POA: Diagnosis not present

## 2024-10-08 DIAGNOSIS — Z992 Dependence on renal dialysis: Secondary | ICD-10-CM | POA: Diagnosis not present

## 2024-10-08 DIAGNOSIS — T426X4A Poisoning by other antiepileptic and sedative-hypnotic drugs, undetermined, initial encounter: Secondary | ICD-10-CM | POA: Diagnosis not present

## 2024-10-08 DIAGNOSIS — I509 Heart failure, unspecified: Secondary | ICD-10-CM | POA: Diagnosis not present

## 2024-10-08 LAB — RENAL FUNCTION PANEL
Albumin: 2.9 g/dL — ABNORMAL LOW (ref 3.5–5.0)
Anion gap: 20 — ABNORMAL HIGH (ref 5–15)
BUN: 42 mg/dL — ABNORMAL HIGH (ref 8–23)
CO2: 23 mmol/L (ref 22–32)
Calcium: 8 mg/dL — ABNORMAL LOW (ref 8.9–10.3)
Chloride: 96 mmol/L — ABNORMAL LOW (ref 98–111)
Creatinine, Ser: 6.14 mg/dL — ABNORMAL HIGH (ref 0.61–1.24)
GFR, Estimated: 9 mL/min — ABNORMAL LOW
Glucose, Bld: 124 mg/dL — ABNORMAL HIGH (ref 70–99)
Phosphorus: 3.8 mg/dL (ref 2.5–4.6)
Potassium: 4.3 mmol/L (ref 3.5–5.1)
Sodium: 139 mmol/L (ref 135–145)

## 2024-10-08 LAB — GLUCOSE, CAPILLARY
Glucose-Capillary: 133 mg/dL — ABNORMAL HIGH (ref 70–99)
Glucose-Capillary: 133 mg/dL — ABNORMAL HIGH (ref 70–99)
Glucose-Capillary: 121 mg/dL — ABNORMAL HIGH (ref 70–99)

## 2024-10-08 LAB — CBC
HCT: 32.6 % — ABNORMAL LOW (ref 39.0–52.0)
Hemoglobin: 10 g/dL — ABNORMAL LOW (ref 13.0–17.0)
MCH: 28.7 pg (ref 26.0–34.0)
MCHC: 30.7 g/dL (ref 30.0–36.0)
MCV: 93.7 fL (ref 80.0–100.0)
Platelets: 154 10*3/uL (ref 150–400)
RBC: 3.48 MIL/uL — ABNORMAL LOW (ref 4.22–5.81)
RDW: 21.1 % — ABNORMAL HIGH (ref 11.5–15.5)
WBC: 8 10*3/uL (ref 4.0–10.5)
nRBC: 0 % (ref 0.0–0.2)

## 2024-10-08 LAB — AMMONIA: Ammonia: 17 umol/L (ref 9–35)

## 2024-10-08 LAB — CULTURE, RESPIRATORY W GRAM STAIN

## 2024-10-08 LAB — APTT: aPTT: 60 s — ABNORMAL HIGH (ref 24–36)

## 2024-10-08 MED ORDER — RENA-VITE PO TABS
1.0000 | ORAL_TABLET | Freq: Every day | ORAL | Status: AC
  Administered 2024-10-08 – 2024-10-16 (×9): 1 via ORAL
  Filled 2024-10-08 (×9): qty 1

## 2024-10-08 MED ORDER — OXYCODONE HCL 5 MG PO TABS
5.0000 mg | ORAL_TABLET | ORAL | Status: AC | PRN
  Administered 2024-10-08 – 2024-10-16 (×15): 5 mg via ORAL
  Filled 2024-10-08 (×14): qty 1

## 2024-10-08 MED ORDER — ATORVASTATIN CALCIUM 80 MG PO TABS
80.0000 mg | ORAL_TABLET | Freq: Every day | ORAL | Status: AC
  Administered 2024-10-08 – 2024-10-16 (×8): 80 mg via ORAL
  Filled 2024-10-08: qty 1
  Filled 2024-10-08: qty 2
  Filled 2024-10-08 (×4): qty 1
  Filled 2024-10-08: qty 2
  Filled 2024-10-08: qty 1

## 2024-10-08 MED ORDER — VANCOMYCIN HCL IN DEXTROSE 1-5 GM/200ML-% IV SOLN
1000.0000 mg | INTRAVENOUS | Status: AC
  Administered 2024-10-12: 1000 mg via INTRAVENOUS

## 2024-10-08 MED ORDER — LACTULOSE 10 GM/15ML PO SOLN
10.0000 g | Freq: Two times a day (BID) | ORAL | Status: AC
  Administered 2024-10-08 – 2024-10-16 (×16): 10 g via ORAL
  Filled 2024-10-08 (×16): qty 15

## 2024-10-08 MED ORDER — IPRATROPIUM-ALBUTEROL 0.5-2.5 (3) MG/3ML IN SOLN: 3.0000 mL | Freq: Four times a day (QID) | RESPIRATORY_TRACT | Status: AC | PRN

## 2024-10-08 MED ORDER — ORAL CARE MOUTH RINSE: 15.0000 mL | OROMUCOSAL | Status: AC | PRN

## 2024-10-08 MED ORDER — SODIUM CHLORIDE 0.9 % IV SOLN
1.0000 g | INTRAVENOUS | Status: DC
  Administered 2024-10-08 – 2024-10-13 (×6): 1 g via INTRAVENOUS
  Filled 2024-10-08 (×7): qty 20

## 2024-10-08 MED ORDER — ENSURE PLUS HIGH PROTEIN PO LIQD
237.0000 mL | Freq: Two times a day (BID) | ORAL | Status: AC
  Administered 2024-10-08 – 2024-10-16 (×14): 237 mL via ORAL

## 2024-10-08 MED ORDER — APIXABAN 5 MG PO TABS
5.0000 mg | ORAL_TABLET | Freq: Two times a day (BID) | ORAL | Status: AC
  Administered 2024-10-08 – 2024-10-16 (×17): 5 mg via ORAL
  Filled 2024-10-08 (×17): qty 1

## 2024-10-08 MED ORDER — JUVEN PO PACK
1.0000 | PACK | Freq: Two times a day (BID) | ORAL | Status: AC
  Administered 2024-10-08 – 2024-10-16 (×14): 1 via ORAL
  Filled 2024-10-08 (×14): qty 1

## 2024-10-08 MED ORDER — FAMOTIDINE 20 MG PO TABS
10.0000 mg | ORAL_TABLET | ORAL | Status: AC
  Administered 2024-10-08 – 2024-10-16 (×5): 10 mg via ORAL
  Filled 2024-10-08 (×6): qty 1

## 2024-10-08 MED ORDER — VANCOMYCIN HCL IN DEXTROSE 1-5 GM/200ML-% IV SOLN
1000.0000 mg | Freq: Once | INTRAVENOUS | Status: AC
  Administered 2024-10-08: 1000 mg via INTRAVENOUS
  Filled 2024-10-08: qty 200

## 2024-10-08 NOTE — Progress Notes (Signed)
 Nutrition Follow-up  DOCUMENTATION CODES:  Non-severe (moderate) malnutrition in context of chronic illness  INTERVENTION:  Continue current diet as ordered Encourage PO intake Ensure Plus High Protein po BID, each supplement provides 350 kcal and 20 grams of protein, prefers vanilla Renal MVI daily Juven BID, each packet provides 95 calories, 2.5 grams of protein (collagen), and micronutrients to support wound healing  NUTRITION DIAGNOSIS:  Moderate Malnutrition related to acute illness, chronic illness as evidenced by moderate muscle depletion, mild fat depletion. - remains applicable  GOAL:  Patient will meet greater than or equal to 90% of their needs - progressing, extubated and diet in place  MONITOR:  PO intake, Supplement acceptance, I & O's, Labs  REASON FOR ASSESSMENT:  Ventilator, Consult Enteral/tube feeding initiation and management  ASSESSMENT:  Pt with hx of CAD s/p bilateral BKAs, CHF, atrial fibrillation, DM type 2, COPD, HTN, HLD, and ESRD on HD presented to ED from his SNF with AMS and hypoxia. Workup consistent with sepsis and unintentional opiate overdose.  Recent admissions: 12/16-12/24, 1/9-1/16  1/27 - presented to ED, intubated due to mentation 1/28 - extubated  Pt resting in bed at the time of assessment. Currently undergoing HD. Pt able to be extubated late yesterday afternoon, TF were not needed due to extubation. Pt reports that he did not eat breakfast this AM but is feeling hungry now. Currently on a DYS2 diet. Pt reports that at baseline he does drink boost at home, prefers vanilla. Will order ensure BID to provide a vanilla flavored supplement.   Admit weight: 89.5 kg   Current weight: 97.3 kg  EDW 106kg  14% weight loss x 1 month, severe 7.8% down from EDW    Intake/Output Summary (Last 24 hours) at 10/08/2024 1145 Last data filed at 10/08/2024 1000 Gross per 24 hour  Intake 778.53 ml  Output --  Net 778.53 ml  Net IO Since Admission:  3,293.01 mL [10/08/24 1145]  Drains/Lines: AV Fistula. left upper arm  Nutritionally Relevant Medications: Scheduled Meds:  apixaban   5 mg Oral BID   atorvastatin   80 mg Oral Daily   famotidine   10 mg Oral QODAY   Ensure Plus High Protein  237 mL Oral BID BM   lactulose   10 g Oral BID   multivitamin  1 tablet Oral QHS   JUVEN  1 packet Oral BID BM   vancomycin     Does not apply See admin instructions   Labs Reviewed: Chloride 96 BUN 42, creatinine 6.14 CBG ranges from 121-164 mg/dL over the last 24 hours HgbA1c 6.9%  NUTRITION - FOCUSED PHYSICAL EXAM: Flowsheet Row Most Recent Value  Orbital Region Mild depletion  Upper Arm Region No depletion  Thoracic and Lumbar Region No depletion  Buccal Region Mild depletion  Temple Region Mild depletion  Clavicle Bone Region Moderate depletion  Clavicle and Acromion Bone Region Moderate depletion  Scapular Bone Region Moderate depletion  Dorsal Hand Unable to assess  Patellar Region No depletion  Anterior Thigh Region No depletion  Posterior Calf Region Unable to assess  [bilateral BKA]  Edema (RD Assessment) Mild  Hair Reviewed  Eyes Reviewed  Mouth Reviewed  Skin Reviewed  Nails Unable to assess    Diet Order:   Diet Order             DIET DYS 2 Room service appropriate? Yes; Fluid consistency: Thin  Diet effective now  EDUCATION NEEDS:  No education needs have been identified at this time  Skin:  Skin Assessment: Skin Integrity Issues: Skin Tear - right arm (2 x 2 x 0.5 cm) Surgical incision: left hand ring finger, recent amputation (2 x 2 cm) Stage 2: sacrum (7 x 6 cm)  Last BM:  1/28 - type 6  Height:  Ht Readings from Last 1 Encounters:  10/06/24 6' 2 (1.88 m)   Weight:  Wt Readings from Last 1 Encounters:  10/08/24 97.3 kg    Ideal Body Weight:  76.2 kg (adjusted by 11.8 for bilateral BKA)  BMI:  Body mass index is 31.4 kg/m. (using 1/28 wt)  Estimated Nutritional Needs:   Kcal:  2100-2300 kcal/d Protein:  110-125g/d Fluid:  1L+UOP    Vernell Lukes, RD, LDN, CNSC Registered Dietitian II Please reach out via secure chat

## 2024-10-08 NOTE — Progress Notes (Signed)
 NEUROLOGY CONSULT FOLLOW UP NOTE   Date of service: October 08, 2024 Patient Name: [name] MRN:  [id number] DOB:  [birth year]  Interval Hx/subjective  Now extubated and conversant.   Vitals   Vitals:   10/08/24 0605 10/08/24 0706 10/08/24 0810 10/08/24 0836  BP: (!) 89/56 99/63  (!) 95/47  Pulse: (!) 103 95  93  Resp: (!) 24 19  (!) 24  Temp: 99.1 F (37.3 C)  98.8 F (37.1 C) 98.6 F (37 C)  TempSrc: Oral  Oral   SpO2: 100% 100%  100%  Weight:    97.3 kg  Height:         Body mass index is 27.54 kg/m.  Physical Exam   Constitutional: Appears well-developed and well-nourished.  Psych: Affect appropriate to situation.  Eyes: No scleral injection.  HENT: No OP obstrucion.  Head: Normocephalic.  Respiratory: Effort normal, non-labored breathing.  Skin: Gangrene to fourth finger of left hand   Ext: Bilateral BKAs  Neurologic Examination   Mental Status: Awake and alert. Oriented to the hospital, city, state, year and month, but not to the day of the week. Speech is sparse but fluent with intact naming and basic comprehension. Follows all commands. He has difficulty adding 1 digit numbers. No dysarthria.  Cranial Nerves: II: Fixates and tracks without difficulty.   III,IV, VI: No ptosis but tends to squint right eye (cannot tell me why). EOMI. No nystagmus.  VII: Smile symmetric VIII: Hearing intact to conversation IX,X: No hoarseness or hypophonia XI: Symmetric XII: No lingual dysarthria Motor: Right : Upper extremity   5/5    Left:     Upper extremity   5/5  Lower extremity   5/5 HF and KE   Lower extremity   5/5 HF and KE Unable to test more distally due to bilateral BKAs No pronator drift Intermittent asterixis of LUE is present when arms are held antigravity. This is significantly improved since initial ED exam. No RUE asterixis.  Sensory: Gross touch intact throughout, bilaterally Deep Tendon Reflexes: 1+ and symmetric throughout Cerebellar: No ataxia  noted Gait: Unable to assess  Medications Current Medications[1]  Labs and Diagnostic Imaging   CBC:  Recent Labs  Lab 10/06/24 0809 10/06/24 0845 10/07/24 0324 10/08/24 0229  WBC 9.5  --  9.5 8.0  NEUTROABS 7.4  --   --   --   HGB 10.1*   < > 12.4* 10.0*  HCT 33.0*   < > 39.2 32.6*  MCV 95.9  --  93.1 93.7  PLT 164  --  167 154   < > = values in this interval not displayed.    Basic Metabolic Panel:  Lab Results  Component Value Date   NA 139 10/08/2024   K 4.3 10/08/2024   CO2 23 10/08/2024   GLUCOSE 124 (H) 10/08/2024   BUN 42 (H) 10/08/2024   CREATININE 6.14 (H) 10/08/2024   CALCIUM  8.0 (L) 10/08/2024   GFRNONAA 9 (L) 10/08/2024   GFRAA 16 (L) 05/30/2020   Lipid Panel:  Lab Results  Component Value Date   LDLCALC 42 08/27/2024   HgbA1c:  Lab Results  Component Value Date   HGBA1C 6.9 (H) 08/28/2024   Urine Drug Screen:     Component Value Date/Time   LABOPIA NEGATIVE 08/26/2024 1303   COCAINSCRNUR NEGATIVE 08/26/2024 1303   LABBENZ NEGATIVE 08/26/2024 1303   AMPHETMU NEGATIVE 08/26/2024 1303   THCU NEGATIVE 08/26/2024 1303   LABBARB NEGATIVE 08/26/2024 1303  Alcohol  Level     Component Value Date/Time   ETH <10 05/28/2023 1243   INR  Lab Results  Component Value Date   INR 2.9 (H) 10/06/2024   APTT  Lab Results  Component Value Date   APTT 60 (H) 10/08/2024   MRI HEAD:  1. No acute intracranial abnormality. 2. 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 3. Underlying age-related cerebral atrophy with mild chronic small vessel ischemic disease.   MRA HEAD:  1. 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 Mild retrograde filling of the distal left V4 segment via the vertebrobasilar junction. Left PICA not  well seen on this exam. 2. Otherwise negative intracranial MRA. No other large vessel occlusion or hemodynamically significant stenosis.   MRA NECK:  1. No acute abnormality about the major arterial vasculature of the neck. 2. Atheromatous irregularity about the carotid bifurcations without hemodynamically significant greater than 50% stenosis. 3. Both vertebral arteries patent within the neck. Right vertebral artery dominant.  LTM EEG report (1/28): Continuous slow, generalized. This study is suggestive of severe diffuse encephalopathy, nonspecific etiology but likely related to sedation. No seizures or epileptiform discharges were seen throughout the recording.  Assessment  GAMAL TODISCO is a 69 y.o. male with hx of A-fib on Eliquis , end-stage renal disease on dialysis, diabetes, hypertension, aortic stenosis, chronic pain, CHF, left hemispheric TIAs, left CEA, carotid stent, cardiac stent and bilateral below-knee amputations who presents from his facility with a 2 to 3-day history of increased lethargy.  He was found to be hypotensive on admission with 2 fentanyl  patches in place.  Fentanyl  patches were removed, and Narcan  was administered with some improvement in responsiveness and blood pressure.  Myoclonic jerking of bilateral upper and lower extremities was appreciated by CCM after Narcan  administration and Neurology was consulted. Of note, the patient does take gabapentin  at his facility. - On exam, he is now extubated, conversant and oriented. Asterixis is now almost completely resolved.   - Imaging results as documented above. No acute stroke seen.   - Impression: Suspect that his acute onset movement disorder with AMS was due to gabapentin  toxicity in the setting of his ESRD. Opiate overdose may have played a role in his acute AMS, but would not be expected to precipitate acute asterixis.   Recommendations  -- Continue off Neurontin . Stay off this medication indefinitely  --  Neurohospitalist service will sign off. Please call with additional questions.  ______________________________________________________________________   Bonney SHARK, Tashera Montalvo, MD [facility]    [1]  Current Facility-Administered Medications:    alteplase  (CATHFLO ACTIVASE ) injection 2 mg, 2 mg, Intracatheter, Once PRN, Gerome, Samantha G, PA-C   anticoagulant sodium citrate  solution 5 mL, 5 mL, Intracatheter, PRN, Collins, Samantha G, PA-C   atorvastatin  (LIPITOR ) tablet 80 mg, 80 mg, Per Tube, Daily, Antonetta Moccasin B, NP, 80 mg at 10/07/24 0943   Chlorhexidine  Gluconate Cloth 2 % PADS 6 each, 6 each, Topical, Daily, Antonetta Moccasin B, NP, 6 each at 10/07/24 0943   Chlorhexidine  Gluconate Cloth 2 % PADS 6 each, 6 each, Topical, Q0600, Melia Lynwood ORN, MD, 6 each at 10/07/24 2300   dexmedetomidine  (PRECEDEX ) 400 MCG/100ML (4 mcg/mL) infusion, 0-1.2 mcg/kg/hr, Intravenous, Continuous, Antonetta Moccasin B, NP, Stopped at 10/07/24 1455   fentaNYL  (SUBLIMAZE ) injection 25 mcg, 25 mcg,  Intravenous, Q3H PRN, Aventura, Emily T, MD, 25 mcg at 10/07/24 2221   heparin  ADULT infusion 100 units/mL (25000 units/250mL), 1,700 Units/hr, Intravenous, Continuous, Laron Agent, RPH, Last Rate: 17 mL/hr at 10/08/24 0600, 1,700 Units/hr at 10/08/24 0600   heparin  injection 1,000 Units, 1,000 Units, Intracatheter, PRN, Collins, Samantha G, PA-C   ipratropium-albuterol  (DUONEB) 0.5-2.5 (3) MG/3ML nebulizer solution 3 mL, 3 mL, Nebulization, Q4H, Simpson, Paula B, NP, 3 mL at 10/08/24 0706   lactulose  (CHRONULAC ) 10 GM/15ML solution 10 g, 10 g, Per Tube, BID, Antonetta Moccasin B, NP, 10 g at 10/07/24 9056   lidocaine  (PF) (XYLOCAINE ) 1 % injection 5 mL, 5 mL, Intradermal, PRN, Collins, Samantha G, PA-C   lidocaine -prilocaine  (EMLA ) cream 1 Application, 1 Application, Topical, PRN, Gerome, Samantha G, PA-C   multivitamin (RENA-VIT) tablet 1 tablet, 1 tablet, Per Tube, QHS, Claudene Toribio BROCKS, MD   mupirocin   ointment (BACTROBAN ) 2 %, , Nasal, BID, Antonetta Moccasin NOVAK, NP, Given at 10/07/24 2134   nutrition supplement (JUVEN) (JUVEN) powder packet 1 packet, 1 packet, Per Tube, BID BM, Claudene Toribio BROCKS, MD   Oral care mouth rinse, 15 mL, Mouth Rinse, PRN, Claudene Toribio BROCKS, MD   pantoprazole  (PROTONIX ) injection 40 mg, 40 mg, Intravenous, Daily, Antonetta Moccasin B, NP, 40 mg at 10/07/24 9056   pentafluoroprop-tetrafluoroeth (GEBAUERS) aerosol 1 Application, 1 Application, Topical, PRN, Collins, Samantha G, PA-C   piperacillin -tazobactam (ZOSYN ) IVPB 2.25 g, 2.25 g, Intravenous, Q8H, Sharie Bourbon, MD, Stopped at 10/08/24 0544   vancomycin  variable dose per unstable renal function (pharmacist dosing), , Does not apply, See admin instructions, Tanda Powell ORN, [facility]

## 2024-10-08 NOTE — Progress Notes (Signed)
 Pharmacy Antibiotic Note  [name] is a 69 y.o. male admitted on 10/06/2024 with sepsis.  Blood cultures from 1/27 are 2/4 MRSE. Pharmacy has been consulted for Vancomycin  dosing.  Plan: -Discontinue Zosyn  -Continue vancomycin  for 5 days per Dr. Claudene -Give Vancomycin  1000 mg IV x1 after HD today, then -Vancomycin  1000 mg IV with HD sessions (weight: 97.3 kg) -Monitor improvement in clinical status  Height: 6' 2 (188 cm) Weight: 97.3 kg (214 lb 8.1 oz) IBW/kg (Calculated) : 82.2  Temp (24hrs), Avg:99.4 F (37.4 C), Min:98.3 F (36.8 C), Max:101.4 F (38.6 C)  Recent Labs  Lab 10/06/24 0809 10/06/24 0845 10/06/24 0846 10/06/24 1059 10/06/24 1217 10/06/24 1455 10/07/24 0324 10/08/24 0229  WBC 9.5  --   --   --   --   --  9.5 8.0  CREATININE 8.04* 8.40*  --   --   --  7.57* 8.23* 6.14*  LATICACIDVEN  --   --  2.2* 0.8 1.2  --   --   --     Estimated Creatinine Clearance: 13 mL/min (A) (by C-G formula based on SCr of 6.14 mg/dL (H)).    Allergies[1]  Antimicrobials this admission: Zosyn  1/27 >> 1/29 Vanc 1/27 >>    Microbiology results: 1/27 TA >> few ESBL E. coli 1/27 BCX >> 2/4 MRSE 1/27 MRSA PCR positive   Thank you for allowing pharmacy to be involved with this patient's care.  [name], PharmD PGY1 Clinical Pharmacist [name]  10/08/2024 2:08 PM    [1]  Allergies Allergen Reactions   Contrast Media [Iodinated Contrast Media] Shortness Of Breath and Other (See Comments)    Altered mental status     Ivp Dye [Iodinated Contrast Media] Anaphylaxis, Shortness Of Breath and Other (See Comments)    Altered mental status     Latex Rash    severe rash appears after the first 24 hours of being placed   Tape Rash and Other (See Comments)    Rash after 1 day of use  Do not leave on longer then 3 days without changing

## 2024-10-08 NOTE — Progress Notes (Signed)
 [name] is an 69 y.o. male ESRD on dialysis, DM, BKA, HTN, HLD, OSA p/w confusion and lethargy for 3 days. Upon arrival to the ED, systolic BP in the 60's, O2 80% on RA. He reportedly had two fentanyl  patches on him which were removed narcan  was administered. He was given 1L of LR with some improvement in mentation. Labs notable for K 151 BUN 54 Cr 8.4 WBC 9.5 Hgb 10.5. CXR concerning for pneumonia. Sepsis protocol was initiated and we was given vanc and cefepime . Of note, he was recently discharge from [facility] with metabolic encephalopathy and sepsis 2/2 pneumonia, was discharged on amoxicillin . Last dialysis was 10/03/24 for 1hr  Dialysis Orders:  [facility] on TTS 180NRe EDW 106kg BFR 450 DFR 800 4hr 2K 2.5Ca No heparin  Mircera 200mcg IV q 2 weeks- last 10/03/24 Venofer  50mg  weekly Hectorol  2mcg Iv q HD  Assessment/Plan:  AMS: Started on sepsis protocol, had recent admission for AMS and had pneumonia at that time. Work up per admitting team. Extubated 1/28.  ESRD:  On TTS Saturday, last HD was Saturday but shortened due to weather.  - Tolerated HD on 1/28  Plan for treatment today as well; limited UF.    Hypertension/volume: BP soft, received 1L LR in the ED. Does not appear volume overloaded on exam  Anemia: Hgb at goal, Not due for ESA  Metabolic bone disease: Calcium  controlled, follow phos. Resume binders once eating  Nutrition:  Currently NPO  Subjective: Extubated 1/28. Oriented to time, place; denies sob/n/v/f/c.   Chemistry and CBC: Creat  Date/Time Value Ref Range Status  02/01/2012 10:28 AM 1.00 0.50 - 1.35 mg/dL Final  96/87/7986 96:96 PM 0.72 0.50 - 1.35 mg/dL Final   Creatinine, Ser  Date/Time Value Ref Range Status  10/08/2024 02:29 AM 6.14 (H) 0.61 - 1.24 mg/dL Final  98/71/7973 96:75 AM 8.23 (H) 0.61 - 1.24 mg/dL Final  98/72/7973 97:44 PM 7.57 (H) 0.61 - 1.24 mg/dL Final  98/72/7973 91:54 AM 8.40 (H) 0.61 - 1.24 mg/dL Final  98/72/7973 91:90  AM 8.04 (H) 0.61 - 1.24 mg/dL Final  98/83/7973 96:57 AM 6.94 (H) 0.61 - 1.24 mg/dL Final  98/84/7973 91:88 AM 5.96 (H) 0.61 - 1.24 mg/dL Final  98/85/7973 91:58 AM 9.24 (H) 0.61 - 1.24 mg/dL Final  98/87/7973 97:94 AM 7.05 (H) 0.61 - 1.24 mg/dL Final  98/88/7973 97:52 AM 6.39 (H) 0.61 - 1.24 mg/dL Final  98/89/7973 96:63 AM 7.74 (H) 0.61 - 1.24 mg/dL Final  98/90/7973 91:80 PM 7.47 (H) 0.61 - 1.24 mg/dL Final  87/75/7974 95:57 AM 6.15 (H) 0.61 - 1.24 mg/dL Final  87/76/7974 94:90 AM 5.04 (H) 0.61 - 1.24 mg/dL Final  87/77/7974 94:45 AM 6.26 (H) 0.61 - 1.24 mg/dL Final  87/78/7974 93:78 AM 4.95 (H) 0.61 - 1.24 mg/dL Final  87/79/7974 91:42 AM 5.75 (H) 0.61 - 1.24 mg/dL Final  87/80/7974 94:49 AM 6.89 (H) 0.61 - 1.24 mg/dL Final  87/81/7974 90:72 AM 6.07 (H) 0.61 - 1.24 mg/dL Final  87/82/7974 98:98 PM 7.96 (H) 0.61 - 1.24 mg/dL Final  87/83/7974 91:67 PM 7.16 (H) 0.61 - 1.24 mg/dL Final  87/83/7974 96:61 PM 7.30 (H) 0.61 - 1.24 mg/dL Final  87/83/7974 97:84 PM 6.63 (H) 0.61 - 1.24 mg/dL Final  87/96/7974 97:85 AM 7.85 (H) 0.61 - 1.24 mg/dL Final  87/97/7974 91:82 PM 7.20 (H) 0.61 - 1.24 mg/dL Final  92/80/7974 92:83 AM 5.86 (H) 0.61 - 1.24 mg/dL Final  92/81/7974 97:98 AM 4.52 (H) 0.61 -  1.24 mg/dL Final  92/82/7974 91:94 AM 6.48 (H) 0.61 - 1.24 mg/dL Final  92/83/7974 88:99 AM 5.75 (H) 0.61 - 1.24 mg/dL Final  92/84/7974 93:92 PM 4.93 (H) 0.61 - 1.24 mg/dL Final  92/89/7974 87:77 AM 7.33 (H) 0.61 - 1.24 mg/dL Final  93/86/7974 93:95 AM 3.55 (H) 0.61 - 1.24 mg/dL Final  93/87/7974 93:66 AM 4.24 (H) 0.61 - 1.24 mg/dL Final  93/88/7974 88:88 AM 3.14 (H) 0.61 - 1.24 mg/dL Final  93/89/7974 91:97 AM 4.23 (H) 0.61 - 1.24 mg/dL Final    Comment:    DELTA CHECK NOTED  02/17/2024 06:36 AM 2.81 (H) 0.61 - 1.24 mg/dL Final  93/91/7974 95:70 AM 1.91 (H) 0.61 - 1.24 mg/dL Final  93/92/7974 95:90 PM 2.24 (H) 0.61 - 1.24 mg/dL Final  93/92/7974 96:69 AM 2.60 (H) 0.61 - 1.24 mg/dL Final   93/93/7974 96:63 PM 2.96 (H) 0.61 - 1.24 mg/dL Final  93/93/7974 95:59 AM 3.53 (H) 0.61 - 1.24 mg/dL Final  93/94/7974 96:62 PM 4.89 (H) 0.61 - 1.24 mg/dL Final  93/94/7974 94:81 AM 5.23 (H) 0.61 - 1.24 mg/dL Final  93/95/7974 95:61 PM 7.85 (H) 0.61 - 1.24 mg/dL Final  93/95/7974 94:77 AM 7.44 (H) 0.61 - 1.24 mg/dL Final  93/96/7974 97:54 PM 6.26 (H) 0.61 - 1.24 mg/dL Final  94/74/7974 92:47 AM 9.62 (H) 0.61 - 1.24 mg/dL Final  94/75/7974 88:80 AM 12.57 (H) 0.61 - 1.24 mg/dL Final  94/76/7974 93:77 PM 10.65 (H) 0.61 - 1.24 mg/dL Final  96/82/7974 96:97 AM 7.31 (H) 0.61 - 1.24 mg/dL Final  96/83/7974 91:52 PM 7.05 (H) 0.61 - 1.24 mg/dL Final  96/83/7974 91:63 AM 6.49 (H) 0.61 - 1.24 mg/dL Final   Recent Labs  Lab 10/06/24 0809 10/06/24 0845 10/06/24 1215 10/06/24 1323 10/06/24 1455 10/07/24 0324 10/08/24 0229  NA 141 141 142 140 143 141 139  K 4.9 4.6 4.8 4.6 4.8 5.6* 4.3  CL 99 100  --   --  100 96* 96*  CO2 25  --   --   --  24 19* 23  GLUCOSE 167* 160*  --   --  158* 170* 124*  BUN 59* 54*  --   --  60* 70* 42*  CREATININE 8.04* 8.40*  --   --  7.57* 8.23* 6.14*  CALCIUM  8.7*  --   --   --  8.1* 8.7* 8.0*  PHOS  --   --   --   --   --  5.4* 3.8   Recent Labs  Lab 10/06/24 0809 10/06/24 0845 10/06/24 1215 10/06/24 1323 10/07/24 0324 10/08/24 0229  WBC 9.5  --   --   --  9.5 8.0  NEUTROABS 7.4  --   --   --   --   --   HGB 10.1*   < > 11.6* 11.2* 12.4* 10.0*  HCT 33.0*   < > 34.0* 33.0* 39.2 32.6*  MCV 95.9  --   --   --  93.1 93.7  PLT 164  --   --   --  167 154   < > = values in this interval not displayed.   Liver Function Tests: Recent Labs  Lab 10/06/24 0809 10/07/24 0324 10/08/24 0229  AST 19  --   --   ALT 13  --   --   ALKPHOS 128*  --   --   BILITOT 0.7  --   --   PROT 5.9*  --   --  ALBUMIN  3.3* 3.3* 2.9*   No results for input(s): LIPASE, AMYLASE in the last 168 hours. Recent Labs  Lab 10/06/24 1053  AMMONIA 63*   Cardiac  Enzymes: No results for input(s): CKTOTAL, CKMB, CKMBINDEX, TROPONINI in the last 168 hours. Iron Studies: No results for input(s): IRON, TIBC, TRANSFERRIN, FERRITIN in the last 72 hours. PT/INR: @LABRCNTIP (inr:5)  Xrays/Other Studies: ) Results for orders placed or performed during the hospital encounter of 10/06/24 (from the past 48 hours)  Blood Culture (routine x 2)     Status: None (Preliminary result)   Collection Time: 10/06/24  8:05 AM   Specimen: BLOOD RIGHT FOREARM  Result Value Ref Range   Specimen Description BLOOD RIGHT FOREARM    Special Requests      BOTTLES DRAWN AEROBIC AND ANAEROBIC Blood Culture adequate volume   Culture  Setup Time      GRAM POSITIVE COCCI IN CLUSTERS IN BOTH AEROBIC AND ANAEROBIC BOTTLES CRITICAL RESULT CALLED TO, READ BACK BY AND VERIFIED WITH: PHARMD B WANNARAT [id number] AT 916 AM BY CM Performed at [facility] [place], [address] [place], [place] [address]    Culture GRAM POSITIVE COCCI    Report Status PENDING   Blood Culture ID Panel (Reflexed)     Status: Abnormal   Collection Time: 10/06/24  8:05 AM  Result Value Ref Range   Enterococcus faecalis NOT DETECTED NOT DETECTED   Enterococcus Faecium NOT DETECTED NOT DETECTED   Listeria monocytogenes NOT DETECTED NOT DETECTED   Staphylococcus species DETECTED (A) NOT DETECTED    Comment: CRITICAL RESULT CALLED TO, READ BACK BY AND VERIFIED WITH: PHARMD B WANNRAT [id number] AT 915 BY CM    Staphylococcus aureus (BCID) NOT DETECTED NOT DETECTED   Staphylococcus epidermidis DETECTED (A) NOT DETECTED    Comment: Methicillin (oxacillin) resistant coagulase negative staphylococcus. Possible blood culture contaminant (unless isolated from more than one blood culture draw or clinical case suggests pathogenicity). No antibiotic treatment is indicated for blood  culture contaminants. CRITICAL RESULT CALLED TO, READ BACK BY AND VERIFIED WITH: PHARMD B WANNATAR [id number] AT 915 BY CM     Staphylococcus lugdunensis NOT DETECTED NOT DETECTED   Streptococcus species NOT DETECTED NOT DETECTED   Streptococcus agalactiae NOT DETECTED NOT DETECTED   Streptococcus pneumoniae NOT DETECTED NOT DETECTED   Streptococcus pyogenes NOT DETECTED NOT DETECTED   A.calcoaceticus-baumannii NOT DETECTED NOT DETECTED   Bacteroides fragilis NOT DETECTED NOT DETECTED   Enterobacterales NOT DETECTED NOT DETECTED   Enterobacter cloacae complex NOT DETECTED NOT DETECTED   Escherichia coli NOT DETECTED NOT DETECTED   Klebsiella aerogenes NOT DETECTED NOT DETECTED   Klebsiella oxytoca NOT DETECTED NOT DETECTED   Klebsiella pneumoniae NOT DETECTED NOT DETECTED   Proteus species NOT DETECTED NOT DETECTED   Salmonella species NOT DETECTED NOT DETECTED   Serratia marcescens NOT DETECTED NOT DETECTED   Haemophilus influenzae NOT DETECTED NOT DETECTED   Neisseria meningitidis NOT DETECTED NOT DETECTED   Pseudomonas aeruginosa NOT DETECTED NOT DETECTED   Stenotrophomonas maltophilia NOT DETECTED NOT DETECTED   Candida albicans NOT DETECTED NOT DETECTED   Candida auris NOT DETECTED NOT DETECTED   Candida glabrata NOT DETECTED NOT DETECTED   Candida krusei NOT DETECTED NOT DETECTED   Candida parapsilosis NOT DETECTED NOT DETECTED   Candida tropicalis NOT DETECTED NOT DETECTED   Cryptococcus neoformans/gattii NOT DETECTED NOT DETECTED   Methicillin resistance mecA/C DETECTED (A) NOT DETECTED    Comment: CRITICAL RESULT CALLED TO, READ BACK BY AND  VERIFIED WITH: MAYA KATHEE CLOSE [id number] AT 915 BY CM Performed at [facility], [address] [place], [place] [address]   Comprehensive metabolic panel     Status: Abnormal   Collection Time: 10/06/24  8:09 AM  Result Value Ref Range   Sodium 141 135 - 145 mmol/L   Potassium 4.9 3.5 - 5.1 mmol/L   Chloride 99 98 - 111 mmol/L   CO2 25 22 - 32 mmol/L   Glucose, Bld 167 (H) 70 - 99 mg/dL    Comment: Glucose reference range applies only to samples  taken after fasting for at least 8 hours.   BUN 59 (H) 8 - 23 mg/dL   Creatinine, Ser 1.95 (H) 0.61 - 1.24 mg/dL   Calcium  8.7 (L) 8.9 - 10.3 mg/dL   Total Protein 5.9 (L) 6.5 - 8.1 g/dL   Albumin  3.3 (L) 3.5 - 5.0 g/dL   AST 19 15 - 41 U/L   ALT 13 0 - 44 U/L   Alkaline Phosphatase 128 (H) 38 - 126 U/L   Total Bilirubin 0.7 0.0 - 1.2 mg/dL   GFR, Estimated 7 (L) >60 mL/min    Comment: (NOTE) Calculated using the CKD-EPI Creatinine Equation (2021)    Anion gap 18 (H) 5 - 15    Comment: Performed at [facility], [address], [place], [place] [address]  CBC with Differential     Status: Abnormal   Collection Time: 10/06/24  8:09 AM  Result Value Ref Range   WBC 9.5 4.0 - 10.5 K/uL   RBC 3.44 (L) 4.22 - 5.81 MIL/uL   Hemoglobin 10.1 (L) 13.0 - 17.0 g/dL   HCT 66.9 (L) 60.9 - 47.9 %   MCV 95.9 80.0 - 100.0 fL   MCH 29.4 26.0 - 34.0 pg   MCHC 30.6 30.0 - 36.0 g/dL   RDW 79.1 (H) 88.4 - 84.4 %   Platelets 164 150 - 400 K/uL   nRBC 0.0 0.0 - 0.2 %   Neutrophils Relative % 77 %   Neutro Abs 7.4 1.7 - 7.7 K/uL   Lymphocytes Relative 13 %   Lymphs Abs 1.2 0.7 - 4.0 K/uL   Monocytes Relative 8 %   Monocytes Absolute 0.8 0.1 - 1.0 K/uL   Eosinophils Relative 1 %   Eosinophils Absolute 0.1 0.0 - 0.5 K/uL   Basophils Relative 1 %   Basophils Absolute 0.1 0.0 - 0.1 K/uL   Immature Granulocytes 0 %   Abs Immature Granulocytes 0.04 0.00 - 0.07 K/uL    Comment: Performed at [facility], [address] [place], [place] [address]  Protime-INR     Status: Abnormal   Collection Time: 10/06/24  8:09 AM  Result Value Ref Range   Prothrombin Time 31.5 (H) 11.4 - 15.2 seconds   INR 2.9 (H) 0.8 - 1.2    Comment: (NOTE) INR goal varies based on device and disease states. Performed at [facility], [address] [place], [place] [address]   Pro Brain natriuretic peptide     Status: Abnormal   Collection Time: 10/06/24  8:09 AM  Result Value Ref Range   Pro Brain  Natriuretic Peptide >35,000.0 (H) <300.0 pg/mL    Comment: (NOTE) Age Group        Cut-Points    Interpretation  < 50 years     450 pg/mL       NT-proBNP > 450 pg/mL indicates  ADHF is likely              50 to 75 years  900 pg/mL      NT-proBNP > 900 pg/mL indicates          ADHF is likely  > 75 years      1800 pg/mL     NT-proBNP > 1800 pg/mL indicates          ADHF is likely                           All ages    Results between       Indeterminate. Further clinical             300 and the cut-   information is needed to determine            point for age group   if ADHF is present.                                                             Elecsys proBNP II/ Elecsys proBNP II STAT           Cut-Point                       Interpretation  300 pg/mL                    NT-proBNP <300pg/mL indicates                             ADHF is not likely  Performed at [facility], [address], [place], [place] [address]   I-stat chem 8, ED (not at [facility], [facility] or [facility])     Status: Abnormal   Collection Time: 10/06/24  8:45 AM  Result Value Ref Range   Sodium 141 135 - 145 mmol/L   Potassium 4.6 3.5 - 5.1 mmol/L   Chloride 100 98 - 111 mmol/L   BUN 54 (H) 8 - 23 mg/dL   Creatinine, Ser 1.59 (H) 0.61 - 1.24 mg/dL   Glucose, Bld 839 (H) 70 - 99 mg/dL    Comment: Glucose reference range applies only to samples taken after fasting for at least 8 hours.   Calcium , Ion 1.03 (L) 1.15 - 1.40 mmol/L   TCO2 29 22 - 32 mmol/L   Hemoglobin 10.5 (L) 13.0 - 17.0 g/dL   HCT 68.9 (L) 60.9 - 47.9 %  I-Stat Lactic Acid, ED     Status: Abnormal   Collection Time: 10/06/24  8:46 AM  Result Value Ref Range   Lactic Acid, Venous 2.2 (HH) 0.5 - 1.9 mmol/L   Comment NOTIFIED PHYSICIAN   Resp panel by RT-PCR (RSV, Flu A&B, Covid) Anterior Nasal Swab     Status: None   Collection Time: 10/06/24  9:31 AM   Specimen: Anterior Nasal Swab  Result Value Ref Range   SARS  Coronavirus 2 by RT PCR NEGATIVE NEGATIVE   Influenza A by PCR NEGATIVE NEGATIVE   Influenza B by PCR NEGATIVE NEGATIVE    Comment: (NOTE) The Xpert Xpress SARS-CoV-2/FLU/RSV plus assay is intended as an aid in the diagnosis of influenza  from Nasopharyngeal swab specimens and should not be used as a sole basis for treatment. Nasal washings and aspirates are unacceptable for Xpert Xpress SARS-CoV-2/FLU/RSV testing.  Fact Sheet for Patients: bloggercourse.com  Fact Sheet for Healthcare Providers: seriousbroker.it  This test is not yet approved or cleared by the [place]  FDA and has been authorized for detection and/or diagnosis of SARS-CoV-2 by FDA under an Emergency Use Authorization (EUA). This EUA will remain in effect (meaning this test can be used) for the duration of the COVID-19 declaration under Section 564(b)(1) of the Act, 21 U.S.C. section 360bbb-3(b)(1), unless the authorization is terminated or revoked.     Resp Syncytial Virus by PCR NEGATIVE NEGATIVE    Comment: (NOTE) Fact Sheet for Patients: bloggercourse.com  Fact Sheet for Healthcare Providers: seriousbroker.it  This test is not yet approved or cleared by the [place]  FDA and has been authorized for detection and/or diagnosis of SARS-CoV-2 by FDA under an Emergency Use Authorization (EUA). This EUA will remain in effect (meaning this test can be used) for the duration of the COVID-19 declaration under Section 564(b)(1) of the Act, 21 U.S.C. section 360bbb-3(b)(1), unless the authorization is terminated or revoked.  Performed at [facility], [address], [place], [place] [address]   Ammonia     Status: Abnormal   Collection Time: 10/06/24 10:53 AM  Result Value Ref Range   Ammonia 63 (H) 9 - 35 umol/L    Comment: Performed at [facility], [address], [place], [place] [address]   I-Stat Lactic Acid, ED     Status: None   Collection Time: 10/06/24 10:59 AM  Result Value Ref Range   Lactic Acid, Venous 0.8 0.5 - 1.9 mmol/L  CBG monitoring, ED     Status: Abnormal   Collection Time: 10/06/24 12:08 PM  Result Value Ref Range   Glucose-Capillary 138 (H) 70 - 99 mg/dL    Comment: Glucose reference range applies only to samples taken after fasting for at least 8 hours.  I-Stat venous blood gas, ([facility] ED, [facility], [facility])     Status: Abnormal   Collection Time: 10/06/24 12:15 PM  Result Value Ref Range   pH, Ven 7.356 7.25 - 7.43   pCO2, Ven 52.4 44 - 60 mmHg   pO2, Ven 27 (LL) 32 - 45 mmHg   Bicarbonate 29.4 (H) 20.0 - 28.0 mmol/L   TCO2 31 22 - 32 mmol/L   O2 Saturation 45 %   Acid-Base Excess 3.0 (H) 0.0 - 2.0 mmol/L   Sodium 142 135 - 145 mmol/L   Potassium 4.8 3.5 - 5.1 mmol/L   Calcium , Ion 1.02 (L) 1.15 - 1.40 mmol/L   HCT 34.0 (L) 39.0 - 52.0 %   Hemoglobin 11.6 (L) 13.0 - 17.0 g/dL   Collection site Brachial    Drawn by HIDE    Sample type VENOUS    Comment NOTIFIED PHYSICIAN   I-Stat CG4 Lactic Acid, ED     Status: None   Collection Time: 10/06/24 12:17 PM  Result Value Ref Range   Lactic Acid, Venous 1.2 0.5 - 1.9 mmol/L  Culture, Respiratory w Gram Stain     Status: None (Preliminary result)   Collection Time: 10/06/24 12:36 PM   Specimen: Tracheal Aspirate; Respiratory  Result Value Ref Range   Specimen Description TRACHEAL ASPIRATE    Special Requests NONE    Gram Stain      FEW WBC PRESENT, PREDOMINANTLY PMN FEW GRAM POSITIVE COCCI  Culture      CULTURE REINCUBATED FOR BETTER GROWTH Performed at [facility], [address] [place], [place] [address]    Report Status PENDING   MRSA Next Gen by PCR, Nasal     Status: Abnormal   Collection Time: 10/06/24  1:00 PM  Result Value Ref Range   MRSA by PCR Next Gen DETECTED (A) NOT DETECTED    Comment: RESULT CALLED TO, READ BACK BY AND VERIFIED WITH: RN Gina on [phone] @1830  by  SM (NOTE) The GeneXpert MRSA Assay (FDA approved for NASAL specimens only), is one component of a comprehensive MRSA colonization surveillance program. It is not intended to diagnose MRSA infection nor to guide or monitor treatment for MRSA infections. Test performance is not FDA approved in patients less than 32 years old. Performed at [facility], [address] [place], [place] [address]   I-Stat arterial blood gas, ED     Status: Abnormal   Collection Time: 10/06/24  1:23 PM  Result Value Ref Range   pH, Arterial 7.444 7.35 - 7.45   pCO2 arterial 37.2 32 - 48 mmHg   pO2, Arterial 414 (H) 83 - 108 mmHg   Bicarbonate 25.5 20.0 - 28.0 mmol/L   TCO2 27 22 - 32 mmol/L   O2 Saturation 100 %   Acid-Base Excess 1.0 0.0 - 2.0 mmol/L   Sodium 140 135 - 145 mmol/L   Potassium 4.6 3.5 - 5.1 mmol/L   Calcium , Ion 1.05 (L) 1.15 - 1.40 mmol/L   HCT 33.0 (L) 39.0 - 52.0 %   Hemoglobin 11.2 (L) 13.0 - 17.0 g/dL   Sample type ARTERIAL   Basic metabolic panel     Status: Abnormal   Collection Time: 10/06/24  2:55 PM  Result Value Ref Range   Sodium 143 135 - 145 mmol/L   Potassium 4.8 3.5 - 5.1 mmol/L   Chloride 100 98 - 111 mmol/L   CO2 24 22 - 32 mmol/L   Glucose, Bld 158 (H) 70 - 99 mg/dL    Comment: Glucose reference range applies only to samples taken after fasting for at least 8 hours.   BUN 60 (H) 8 - 23 mg/dL   Creatinine, Ser 2.42 (H) 0.61 - 1.24 mg/dL   Calcium  8.1 (L) 8.9 - 10.3 mg/dL   GFR, Estimated 7 (L) >60 mL/min    Comment: (NOTE) Calculated using the CKD-EPI Creatinine Equation (2021)    Anion gap 19 (H) 5 - 15    Comment: Performed at [facility], [address], [place], [place] [address]  Magnesium      Status: None   Collection Time: 10/06/24  2:55 PM  Result Value Ref Range   Magnesium  2.2 1.7 - 2.4 mg/dL    Comment: Performed at [facility], [address], [place], [place] [address]  Hepatitis B surface antigen     Status: None    Collection Time: 10/06/24  2:55 PM  Result Value Ref Range   Hepatitis B Surface Ag NON REACTIVE NON REACTIVE    Comment: Performed at [facility], [address] [place], [place] [address]  Hepatitis B surface antibody,quantitative     Status: None   Collection Time: 10/06/24  2:55 PM  Result Value Ref Range   Hep B S AB Quant (Post) 42,475.0 Immunity>10 mIU/mL    Comment: (NOTE) Results confirmed on dilution.  Status of Immunity  Anti-HBs Level  ------------------                     -------------- Inconsistent with Immunity                  0.0 - 10.0 Consistent with Immunity                         >10.0 Performed At: [facility] [address] [place], [place] [id number] Jennette Shorter MD Ey:[phone]   Glucose, capillary     Status: Abnormal   Collection Time: 10/06/24  3:34 PM  Result Value Ref Range   Glucose-Capillary 156 (H) 70 - 99 mg/dL    Comment: Glucose reference range applies only to samples taken after fasting for at least 8 hours.  Blood Culture (routine x 2)     Status: None (Preliminary result)   Collection Time: 10/06/24  7:52 PM   Specimen: BLOOD LEFT HAND  Result Value Ref Range   Specimen Description BLOOD LEFT HAND    Special Requests      BOTTLES DRAWN AEROBIC AND ANAEROBIC Blood Culture adequate volume   Culture      NO GROWTH < 12 HOURS Performed at [facility], [address], [place], [place] [address]    Report Status PENDING   Glucose, capillary     Status: Abnormal   Collection Time: 10/06/24  7:57 PM  Result Value Ref Range   Glucose-Capillary 158 (H) 70 - 99 mg/dL    Comment: Glucose reference range applies only to samples taken after fasting for at least 8 hours.   Comment 1 Notify RN   Glucose, capillary     Status: Abnormal   Collection Time: 10/06/24 11:42 PM  Result Value Ref Range   Glucose-Capillary 153 (H) 70 - 99 mg/dL    Comment: Glucose reference range applies only to samples taken  after fasting for at least 8 hours.   Comment 1 Notify RN   CBC     Status: Abnormal   Collection Time: 10/07/24  3:24 AM  Result Value Ref Range   WBC 9.5 4.0 - 10.5 K/uL   RBC 4.21 (L) 4.22 - 5.81 MIL/uL   Hemoglobin 12.4 (L) 13.0 - 17.0 g/dL   HCT 60.7 60.9 - 47.9 %   MCV 93.1 80.0 - 100.0 fL   MCH 29.5 26.0 - 34.0 pg   MCHC 31.6 30.0 - 36.0 g/dL   RDW 78.8 (H) 88.4 - 84.4 %   Platelets 167 150 - 400 K/uL   nRBC 0.0 0.0 - 0.2 %    Comment: Performed at [name], [address], [place], [place] [address]  Renal function panel     Status: Abnormal   Collection Time: 10/07/24  3:24 AM  Result Value Ref Range   Sodium 141 135 - 145 mmol/L    Comment: Electrolytes repeated to verify    Potassium 5.6 (H) 3.5 - 5.1 mmol/L    Comment: HEMOLYSIS AT THIS LEVEL MAY AFFECT RESULT   Chloride 96 (L) 98 - 111 mmol/L   CO2 19 (L) 22 - 32 mmol/L   Glucose, Bld 170 (H) 70 - 99 mg/dL    Comment: Glucose reference range applies only to samples taken after fasting for at least 8 hours.   BUN 70 (H) 8 - 23 mg/dL   Creatinine, Ser 1.76 (H) 0.61 - 1.24 mg/dL   Calcium  8.7 (L)  8.9 - 10.3 mg/dL   Phosphorus 5.4 (H) 2.5 - 4.6 mg/dL   Albumin  3.3 (L) 3.5 - 5.0 g/dL   GFR, Estimated 6 (L) >60 mL/min    Comment: (NOTE) Calculated using the CKD-EPI Creatinine Equation (2021)    Anion gap 26 (H) 5 - 15    Comment: Performed at [facility], [address] [place], [place] [address]  APTT     Status: None   Collection Time: 10/07/24  3:24 AM  Result Value Ref Range   aPTT 35 24 - 36 seconds    Comment: Performed at [facility], [address] [place], [place] [address]  Heparin  level (unfractionated)     Status: Abnormal   Collection Time: 10/07/24  3:24 AM  Result Value Ref Range   Heparin  Unfractionated >1.10 (H) 0.30 - 0.70 IU/mL    Comment: (NOTE) The clinical reportable range upper limit is being lowered to >1.10 to align with the FDA approved guidance for the  current laboratory assay.  If heparin  results are below expected values, and patient dosage has  been confirmed, suggest follow up testing of antithrombin III levels. Performed at [facility], [address], [place], [place] [address]   Glucose, capillary     Status: Abnormal   Collection Time: 10/07/24  3:31 AM  Result Value Ref Range   Glucose-Capillary 160 (H) 70 - 99 mg/dL    Comment: Glucose reference range applies only to samples taken after fasting for at least 8 hours.   Comment 1 Notify RN   Glucose, capillary     Status: Abnormal   Collection Time: 10/07/24  7:20 AM  Result Value Ref Range   Glucose-Capillary 135 (H) 70 - 99 mg/dL    Comment: Glucose reference range applies only to samples taken after fasting for at least 8 hours.  Glucose, capillary     Status: Abnormal   Collection Time: 10/07/24 11:26 AM  Result Value Ref Range   Glucose-Capillary 139 (H) 70 - 99 mg/dL    Comment: Glucose reference range applies only to samples taken after fasting for at least 8 hours.  Heparin  level (unfractionated)     Status: Abnormal   Collection Time: 10/07/24  2:43 PM  Result Value Ref Range   Heparin  Unfractionated >1.10 (H) 0.30 - 0.70 IU/mL    Comment: (NOTE) The clinical reportable range upper limit is being lowered to >1.10 to align with the FDA approved guidance for the current laboratory assay.  If heparin  results are below expected values, and patient dosage has  been confirmed, suggest follow up testing of antithrombin III levels. Performed at [facility], [address], [place], [place] [address]   APTT     Status: Abnormal   Collection Time: 10/07/24  2:43 PM  Result Value Ref Range   aPTT 60 (H) 24 - 36 seconds    Comment:        IF BASELINE aPTT IS ELEVATED, SUGGEST PATIENT RISK ASSESSMENT BE USED TO DETERMINE APPROPRIATE ANTICOAGULANT THERAPY. Performed at [facility], [address], [place], [place] [address]   Glucose,  capillary     Status: Abnormal   Collection Time: 10/07/24  3:28 PM  Result Value Ref Range   Glucose-Capillary 146 (H) 70 - 99 mg/dL    Comment: Glucose reference range applies only to samples taken after fasting for at least 8 hours.  Glucose, capillary     Status: Abnormal  Collection Time: 10/07/24  7:54 PM  Result Value Ref Range   Glucose-Capillary 164 (H) 70 - 99 mg/dL    Comment: Glucose reference range applies only to samples taken after fasting for at least 8 hours.  Glucose, capillary     Status: Abnormal   Collection Time: 10/07/24 11:39 PM  Result Value Ref Range   Glucose-Capillary 153 (H) 70 - 99 mg/dL    Comment: Glucose reference range applies only to samples taken after fasting for at least 8 hours.  APTT     Status: Abnormal   Collection Time: 10/08/24  2:29 AM  Result Value Ref Range   aPTT 60 (H) 24 - 36 seconds    Comment:        IF BASELINE aPTT IS ELEVATED, SUGGEST PATIENT RISK ASSESSMENT BE USED TO DETERMINE APPROPRIATE ANTICOAGULANT THERAPY. Performed at [facility], [address], [place], [place] [address]   Renal function panel     Status: Abnormal   Collection Time: 10/08/24  2:29 AM  Result Value Ref Range   Sodium 139 135 - 145 mmol/L    Comment: Electrolytes repeated to verify    Potassium 4.3 3.5 - 5.1 mmol/L    Comment: Delta check noted    Chloride 96 (L) 98 - 111 mmol/L   CO2 23 22 - 32 mmol/L   Glucose, Bld 124 (H) 70 - 99 mg/dL    Comment: Glucose reference range applies only to samples taken after fasting for at least 8 hours.   BUN 42 (H) 8 - 23 mg/dL   Creatinine, Ser 3.85 (H) 0.61 - 1.24 mg/dL   Calcium  8.0 (L) 8.9 - 10.3 mg/dL   Phosphorus 3.8 2.5 - 4.6 mg/dL   Albumin  2.9 (L) 3.5 - 5.0 g/dL   GFR, Estimated 9 (L) >60 mL/min    Comment: (NOTE) Calculated using the CKD-EPI Creatinine Equation (2021)    Anion gap 20 (H) 5 - 15    Comment: Performed at [facility], [address] [place], [place] [address]  CBC      Status: Abnormal   Collection Time: 10/08/24  2:29 AM  Result Value Ref Range   WBC 8.0 4.0 - 10.5 K/uL   RBC 3.48 (L) 4.22 - 5.81 MIL/uL   Hemoglobin 10.0 (L) 13.0 - 17.0 g/dL   HCT 67.3 (L) 60.9 - 47.9 %   MCV 93.7 80.0 - 100.0 fL   MCH 28.7 26.0 - 34.0 pg   MCHC 30.7 30.0 - 36.0 g/dL   RDW 78.8 (H) 88.4 - 84.4 %   Platelets 154 150 - 400 K/uL   nRBC 0.0 0.0 - 0.2 %    Comment: Performed at [facility], [address], [place], [place] [address]  Glucose, capillary     Status: Abnormal   Collection Time: 10/08/24  3:34 AM  Result Value Ref Range   Glucose-Capillary 133 (H) 70 - 99 mg/dL    Comment: Glucose reference range applies only to samples taken after fasting for at least 8 hours.  Glucose, capillary     Status: Abnormal   Collection Time: 10/08/24  7:36 AM  Result Value Ref Range   Glucose-Capillary 133 (H) 70 - 99 mg/dL    Comment: Glucose reference range applies only to samples taken after fasting for at least 8 hours.   *Note: Due to a large number of results and/or encounters for the requested time period, some results have not been displayed. A  complete set of results can be found in Results Review.   Overnight EEG with video Result Date: 10/07/2024 Shelton Arlin KIDD, MD     10/07/2024  8:35 AM Patient Name: MAKENZIE VITTORIO MRN: [id number] Epilepsy Attending: Arlin KIDD Shelton Referring Physician/Provider: Antonetta Vina NOVAK, NP Duration: 10/07/2024 0125 to 0830 Patient history: 69yo M with 2 to 3-day history of increased lethargy. EEG to evaluate for seizure Level of alertness: comatose/ lethargic AEDs during EEG study: None Technical aspects: This EEG study was done with scalp electrodes positioned according to the 10-20 International system of electrode placement. Electrical activity was reviewed with band pass filter of 1-70Hz , sensitivity of 7 uV/mm, display speed of 55mm/sec with a 60Hz  notched filter applied as appropriate. EEG data were recorded continuously and  digitally stored.  Video monitoring was available and reviewed as appropriate. Description: EEG showed continuous generalized 3 to 6 Hz theta-delta slowing admixed with 15 to 18 Hz beta activity distributed symmetrically and diffusely. Hyperventilation and photic stimulation were not performed.   EEG was disconnected between 10/07/2024 0244 to 0538 ABNORMALITY - Continuous slow, generalized IMPRESSION: This study is suggestive of severe diffuse encephalopathy, nonspecific etiology but likely related to sedation. No seizures or epileptiform discharges were seen throughout the recording. Arlin KIDD Shelton   DG Abd Portable 1V Result Date: 10/06/2024 CLINICAL DATA:  OG tube placement EXAM: PORTABLE ABDOMEN - 1 VIEW COMPARISON:  10/06/2024 FINDINGS: Enteric tube tip below the diaphragm but incompletely included in the field of view. Cardiomegaly and dense retrocardiac airspace disease, possible left effusion. IMPRESSION: Enteric tube tip below the diaphragm but incompletely included in the field of view. Electronically Signed   By: Luke Bun M.D.   On: 10/06/2024 23:46   MR BRAIN WO CONTRAST Result Date: 10/06/2024 CLINICAL DATA:  Initial evaluation for acute neuro deficit, stroke suspected. EXAM: MRI HEAD WITHOUT CONTRAST MRA HEAD WITHOUT CONTRAST MRA NECK WITHOUT CONTRAST TECHNIQUE: Multiplanar, multiecho pulse sequences of the brain and surrounding structures were obtained without intravenous contrast. Angiographic images of the Circle of Willis were obtained using MRA technique without intravenous contrast. Angiographic images of the neck were obtained using MRA technique without intravenous contrast. Carotid stenosis measurements (when applicable) are obtained utilizing NASCET criteria, using the distal internal carotid diameter as the denominator. COMPARISON:  CT from earlier the same day. FINDINGS: MRI HEAD FINDINGS Brain: Diffuse prominence of the CSF containing spaces compatible with generalized  cerebral atrophy. Patchy T2/FLAIR hyperintensity involving the periventricular and deep white matter, consistent chronic small vessel ischemic disease, mild in nature. 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 Mild scattered chronic hemosiderin staining noted about a few of these chronic infarcts. Few small remote bilateral cerebellar infarcts noted. Remote lacunar infarct at the right thalamus. No evidence for acute or subacute ischemia. Gray-white matter differentiation otherwise maintained. No acute intracranial hemorrhage. Few additional small chronic micro hemorrhages noted, likely small vessel related. No mass lesion, midline shift or mass effect. No hydrocephalus or extra-axial fluid collection. Pituitary gland within normal limits. Vascular: Loss of normal flow void within the intradural left V4 segment, likely occluded. Major intracranial vascular flow voids are otherwise maintained. Skull and upper cervical spine: Craniocervical junction within normal limits. Bone marrow signal intensity overall within normal limits. No scalp soft tissue abnormality. Sinuses/Orbits: Prior bilateral ocular lens replacement. Paranasal sinuses are largely clear. Small left mastoid effusion noted, of doubtful significance. Patient appears to be intubated.  Other: None. MRA HEAD FINDINGS ANTERIOR CIRCULATION: Both internal carotid arteries are patent through the siphons without significant stenosis or other abnormality. Right A1 segment patent. Left A1 hypoplastic and not well seen. Normal anterior communicating complex. Anterior cerebral arteries patent without significant stenosis. No M1 stenosis or occlusion. No proximal MCA branch occlusion or high-grade stenosis. Distal MCA branches perfused and symmetric. POSTERIOR CIRCULATION: Dominant right vertebral artery patent to the vertebrobasilar junction without  stenosis. Right PICA grossly patent at its origin. 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 Mild retrograde filling of the distal left V4 segment via the vertebrobasilar junction. Left PICA not well seen on this exam. Basilar patent without stenosis. Superior cerebellar arteries patent bilaterally. Both PCAs primarily supplied via the basilar patent to their distal aspects without significant stenosis. No intracranial aneurysm. MRA NECK FINDINGS AORTIC ARCH: Examination technically limited by in technique. Aortic arch and origin of the great vessels not well assessed on this examination. RIGHT CAROTID SYSTEM: Visualized right common and internal carotid arteries are patent with antegrade flow. No visible dissection. Atheromatous irregularity about the right carotid bulb without hemodynamically significant greater than 50% stenosis. LEFT CAROTID SYSTEM: Visualized left common and internal carotid arteries are patent with antegrade flow. No visible dissection. Atheromatous irregularity about the left carotid bulb without convincing hemodynamically significant greater than 50% stenosis. VERTEBRAL ARTERIES: Neither vertebral artery origin visualized on this exam. Right vertebral artery dominant. Both vertebral arteries patent with antegrade flow. No visible stenosis or dissection. Other: None. IMPRESSION: MRI HEAD: 1. No acute intracranial abnormality. 2. 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 3. Underlying age-related cerebral atrophy with mild chronic small vessel ischemic disease. MRA HEAD: 1. 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 Mild retrograde filling of the distal left V4 segment via the vertebrobasilar junction. Left  PICA not well seen on this exam. 2. Otherwise negative intracranial MRA. No other large vessel occlusion or hemodynamically significant stenosis. MRA NECK: 1. No acute abnormality about the major arterial vasculature of the neck. 2. Atheromatous irregularity about the carotid bifurcations without hemodynamically significant greater than 50% stenosis. 3. Both vertebral arteries patent within the neck. Right vertebral artery dominant. Electronically Signed   By: Morene Hoard M.D.   On: 10/06/2024 22:37   MR ANGIO HEAD WO CONTRAST Result Date: 10/06/2024 CLINICAL DATA:  Initial evaluation for acute neuro deficit, stroke suspected. EXAM: MRI HEAD WITHOUT CONTRAST MRA HEAD WITHOUT CONTRAST MRA NECK WITHOUT CONTRAST TECHNIQUE: Multiplanar, multiecho pulse sequences of the brain and surrounding structures were obtained without intravenous contrast. Angiographic images of the Circle of Willis were obtained using MRA technique without intravenous contrast. Angiographic images of the neck were obtained using MRA technique without intravenous contrast. Carotid stenosis measurements (when applicable) are obtained utilizing NASCET criteria, using the distal internal carotid diameter as the denominator. COMPARISON:  CT from earlier the same day. FINDINGS: MRI HEAD FINDINGS Brain: Diffuse prominence of the CSF containing spaces compatible with generalized cerebral atrophy. Patchy T2/FLAIR hyperintensity involving the periventricular and deep white matter, consistent chronic small vessel ischemic disease, mild in nature. 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 Mild scattered chronic hemosiderin staining noted about a few of  these chronic infarcts. Few small remote bilateral cerebellar infarcts noted. Remote lacunar infarct at the right thalamus. No evidence for acute or subacute ischemia. Gray-white matter  differentiation otherwise maintained. No acute intracranial hemorrhage. Few additional small chronic micro hemorrhages noted, likely small vessel related. No mass lesion, midline shift or mass effect. No hydrocephalus or extra-axial fluid collection. Pituitary gland within normal limits. Vascular: Loss of normal flow void within the intradural left V4 segment, likely occluded. Major intracranial vascular flow voids are otherwise maintained. Skull and upper cervical spine: Craniocervical junction within normal limits. Bone marrow signal intensity overall within normal limits. No scalp soft tissue abnormality. Sinuses/Orbits: Prior bilateral ocular lens replacement. Paranasal sinuses are largely clear. Small left mastoid effusion noted, of doubtful significance. Patient appears to be intubated. Other: None. MRA HEAD FINDINGS ANTERIOR CIRCULATION: Both internal carotid arteries are patent through the siphons without significant stenosis or other abnormality. Right A1 segment patent. Left A1 hypoplastic and not well seen. Normal anterior communicating complex. Anterior cerebral arteries patent without significant stenosis. No M1 stenosis or occlusion. No proximal MCA branch occlusion or high-grade stenosis. Distal MCA branches perfused and symmetric. POSTERIOR CIRCULATION: Dominant right vertebral artery patent to the vertebrobasilar junction without stenosis. Right PICA grossly patent at its origin. 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 Mild retrograde filling of the distal left V4 segment via the vertebrobasilar junction. Left PICA not well seen on this exam. Basilar patent without stenosis. Superior cerebellar arteries patent bilaterally. Both PCAs primarily supplied via the basilar patent to their distal aspects without significant stenosis. No intracranial aneurysm. MRA NECK FINDINGS AORTIC ARCH: Examination technically limited by in technique. Aortic  arch and origin of the great vessels not well assessed on this examination. RIGHT CAROTID SYSTEM: Visualized right common and internal carotid arteries are patent with antegrade flow. No visible dissection. Atheromatous irregularity about the right carotid bulb without hemodynamically significant greater than 50% stenosis. LEFT CAROTID SYSTEM: Visualized left common and internal carotid arteries are patent with antegrade flow. No visible dissection. Atheromatous irregularity about the left carotid bulb without convincing hemodynamically significant greater than 50% stenosis. VERTEBRAL ARTERIES: Neither vertebral artery origin visualized on this exam. Right vertebral artery dominant. Both vertebral arteries patent with antegrade flow. No visible stenosis or dissection. Other: None. IMPRESSION: MRI HEAD: 1. No acute intracranial abnormality. 2. 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 3. Underlying age-related cerebral atrophy with mild chronic small vessel ischemic disease. MRA HEAD: 1. 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 Mild retrograde filling of the distal left V4 segment via the vertebrobasilar junction. Left PICA not well seen on this exam. 2. Otherwise negative intracranial MRA. No other large vessel occlusion or hemodynamically significant stenosis. MRA NECK: 1. No acute abnormality about the major arterial vasculature of the neck. 2. Atheromatous irregularity about the carotid bifurcations without hemodynamically significant greater than 50% stenosis. 3. Both vertebral arteries patent within the neck. Right vertebral artery dominant. Electronically Signed   By: Morene Hoard M.D.   On: 10/06/2024 22:37   MR ANGIO NECK WO CONTRAST Result Date: 10/06/2024 CLINICAL  DATA:  Initial evaluation for acute neuro deficit, stroke suspected. EXAM: MRI HEAD WITHOUT CONTRAST MRA HEAD WITHOUT CONTRAST MRA NECK WITHOUT CONTRAST TECHNIQUE: Multiplanar, multiecho pulse sequences  of the brain and surrounding structures were obtained without intravenous contrast. Angiographic images of the Circle of Willis were obtained using MRA technique without intravenous contrast. Angiographic images of the neck were obtained using MRA technique without intravenous contrast. Carotid stenosis measurements (when applicable) are obtained utilizing NASCET criteria, using the distal internal carotid diameter as the denominator. COMPARISON:  CT from earlier the same day. FINDINGS: MRI HEAD FINDINGS Brain: Diffuse prominence of the CSF containing spaces compatible with generalized cerebral atrophy. Patchy T2/FLAIR hyperintensity involving the periventricular and deep white matter, consistent chronic small vessel ischemic disease, mild in nature. 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 Mild scattered chronic hemosiderin staining noted about a few of these chronic infarcts. Few small remote bilateral cerebellar infarcts noted. Remote lacunar infarct at the right thalamus. No evidence for acute or subacute ischemia. Gray-white matter differentiation otherwise maintained. No acute intracranial hemorrhage. Few additional small chronic micro hemorrhages noted, likely small vessel related. No mass lesion, midline shift or mass effect. No hydrocephalus or extra-axial fluid collection. Pituitary gland within normal limits. Vascular: Loss of normal flow void within the intradural left V4 segment, likely occluded. Major intracranial vascular flow voids are otherwise maintained. Skull and upper cervical spine: Craniocervical junction within normal limits. Bone marrow signal intensity overall within normal  limits. No scalp soft tissue abnormality. Sinuses/Orbits: Prior bilateral ocular lens replacement. Paranasal sinuses are largely clear. Small left mastoid effusion noted, of doubtful significance. Patient appears to be intubated. Other: None. MRA HEAD FINDINGS ANTERIOR CIRCULATION: Both internal carotid arteries are patent through the siphons without significant stenosis or other abnormality. Right A1 segment patent. Left A1 hypoplastic and not well seen. Normal anterior communicating complex. Anterior cerebral arteries patent without significant stenosis. No M1 stenosis or occlusion. No proximal MCA branch occlusion or high-grade stenosis. Distal MCA branches perfused and symmetric. POSTERIOR CIRCULATION: Dominant right vertebral artery patent to the vertebrobasilar junction without stenosis. Right PICA grossly patent at its origin. 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 Mild retrograde filling of the distal left V4 segment via the vertebrobasilar junction. Left PICA not well seen on this exam. Basilar patent without stenosis. Superior cerebellar arteries patent bilaterally. Both PCAs primarily supplied via the basilar patent to their distal aspects without significant stenosis. No intracranial aneurysm. MRA NECK FINDINGS AORTIC ARCH: Examination technically limited by in technique. Aortic arch and origin of the great vessels not well assessed on this examination. RIGHT CAROTID SYSTEM: Visualized right common and internal carotid arteries are patent with antegrade flow. No visible dissection. Atheromatous irregularity about the right carotid bulb without hemodynamically significant greater than 50% stenosis. LEFT CAROTID SYSTEM: Visualized left common and internal carotid arteries are patent with antegrade flow. No visible dissection. Atheromatous irregularity about the left carotid bulb without convincing hemodynamically significant greater than 50%  stenosis. VERTEBRAL ARTERIES: Neither vertebral artery origin visualized on this exam. Right vertebral artery dominant. Both vertebral arteries patent with antegrade flow. No visible stenosis or dissection. Other: None. IMPRESSION: MRI HEAD: 1. No acute intracranial abnormality. 2. 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 3. Underlying age-related cerebral atrophy with mild chronic small vessel ischemic disease. MRA HEAD: 1. Occlusion of the left vertebral artery as it crosses  into the cranial vault. Mild retrograde filling of the distal left V4 segment via the vertebrobasilar junction. Left PICA not well seen on this exam. 2. Otherwise negative intracranial MRA. No other large vessel occlusion or hemodynamically significant stenosis. MRA NECK: 1. No acute abnormality about the major arterial vasculature of the neck. 2. Atheromatous irregularity about the carotid bifurcations without hemodynamically significant greater than 50% stenosis. 3. Both vertebral arteries patent within the neck. Right vertebral artery dominant. Electronically Signed   By: Morene Hoard M.D.   On: 10/06/2024 22:37   CT HEAD WO CONTRAST ( ) Result Date: 10/06/2024 EXAM: CT HEAD WITHOUT CONTRAST 10/06/2024 01:14:00 PM TECHNIQUE: CT of the head was performed without the administration of intravenous contrast. Automated exposure control, iterative reconstruction, and/or weight based adjustment of the mA/kV was utilized to reduce the radiation dose to as low as reasonably achievable. COMPARISON: 08/25/2024 CLINICAL HISTORY: Altered mental status, on Eliquis . FINDINGS: BRAIN AND VENTRICLES: No acute hemorrhage. No evidence of acute infarct. Scattered periventricular white matter hypoattenuation, consistent with mild chronic ischemic  microvascular disease. Encephalomalacia in the posterior and inferior right parietal lobe compatible with remote infarct. Additional encephalomalacia involving the left middle frontal gyrus and centrum semiovale. Mild volume loss. Additional small remote cortical infarcts in the posterior aspect of the left superior temporal gyrus and within the lateral aspect of the left occipital lobe. No hydrocephalus. No extra-axial collection. No mass effect or midline shift. ORBITS: Bilateral lens replacements noted. Calcified atherosclerotic plaque within cavernous/supraclinoid internal carotid arteries. SINUSES: Trace left mastoid effusion. Mild mucosal thickening in the ethmoid sinuses and left maxillary sinus. Trace fluid layering in the nasopharynx. SOFT TISSUES AND SKULL: No acute soft tissue abnormality. No skull fracture. IMPRESSION: 1. No acute intracranial abnormality. 2. Multiple remote infarcts as above. Electronically signed by: Donnice Mania MD 10/06/2024 01:32 PM EST RP Workstation: [id number]   DG Chest Portable 1 View Result Date: 10/06/2024 CLINICAL DATA:  Altered mental status, lethargy and hypoxia. Status post intubation and placement of orogastric tube. EXAM: PORTABLE CHEST 1 VIEW COMPARISON:  Film at 0830 hours FINDINGS: Endotracheal tube present with tip approximately 4.5 cm above the carina. Orogastric tube extends below the diaphragm into the stomach. Stable significant cardiac enlargement. Stable left lower lobe atelectasis/consolidation. Potential component of left pleural fluid. No overt pulmonary edema or pneumothorax. The visualized skeletal structures are unremarkable. IMPRESSION: Endotracheal tube tip approximately 4.5 cm above the carina. Orogastric tube extends into the stomach. Stable left lower lobe atelectasis/consolidation. Electronically Signed   By: Marcey Moan M.D.   On: 10/06/2024 13:25   DG Abdomen 1 View Result Date: 10/06/2024 CLINICAL DATA:  Gastric tube placement EXAM:  ABDOMEN - 1 VIEW COMPARISON:  Chest x-ray obtained earlier today FINDINGS: The tip of the gastric tube overlies the GE junction. Dense opacification of the left lung base. Tortuous and calcified splenic artery noted incidentally. Surgical clips in the right upper quadrant. No evidence of bowel obstruction. Partially imaged lumbosacral fixation hardware. IMPRESSION: The tip of the gastric tube overlies the GE junction. Recommend advancing 5-10 cm for more optimal placement. Partially imaged dense opacification of the left lung base may reflect pleural fluid with atelectasis or infiltrate. Electronically Signed   By: Wilkie Lent M.D.   On: 10/06/2024 13:21   DG Hand Complete Left Result Date: 10/06/2024 EXAM: 3 OR MORE VIEW(S) XRAY OF THE LEFT HAND 10/06/2024 08:48:00 AM COMPARISON: 08/31/2024 CLINICAL HISTORY: 69 year old male with a wound. FINDINGS: BONES AND JOINTS: Limited evaluation of the digits because  of flexion on all views. Mild degenerative changes. No acute fracture. No malalignment. SOFT TISSUES: 3 mm metallic density along the dorsal medial aspect of the wrist, chronic and stable retained foreign body. Extensive vascular calcifications. Generalized soft tissue swelling at the proximal hand and wrist has increased since last month. IMPRESSION: 1. No acute osseous abnormality identified about the left hand. 2. Generalized proximal hand and wrist soft tissue swelling is new or increased. 3. Very severe calcified peripheral vascular disease. Electronically signed by: Helayne Hurst MD 10/06/2024 09:08 AM EST RP Workstation: [id number]   DG Hand 2 View Right Result Date: 10/06/2024 EXAM: 1 or 2 VIEW(S) XRAY OF THE RIGHT HAND 10/06/2024 08:48:00 AM COMPARISON: Right hand series 08/31/2024. CLINICAL HISTORY: 69 year old male with an extremity wound. FINDINGS: BONES AND JOINTS: No acute fracture. No malalignment. Moderate first CMC degeneration. Mild first MCP and IP osteoarthritis. SOFT TISSUES: Pulse  oximeter artifact over the right 2nd finger. Incidental intravenous access artifact at the wrist. Vascular calcifications. IMPRESSION: 1. No acute osseous abnormality identified about the right hand. 2. Very severe calcified peripheral vascular disease. Electronically signed by: Helayne Hurst MD 10/06/2024 09:06 AM EST RP Workstation: [id number]   DG Chest Port 1 View Result Date: 10/06/2024 EXAM: 1 VIEW(S) XRAY OF THE CHEST 10/06/2024 08:48:00 AM COMPARISON: 09/18/2024 CLINICAL HISTORY: 69 year old male with query sepsis, increasing confusion, and lethargy for 3 days. Evaluate for abnormality. FINDINGS: LUNGS AND PLEURA: Ongoing and mildly progressed dense left lung base opacification obscuring the left hemidiaphragm. This may all be pleural effusion, but a combination of consolidation and pleural effusion is possible. Stable nearby linear platelike atelectasis. Streaky right lung base opacity also appears increased. No pneumothorax. HEART AND MEDIASTINUM: Cardiomegaly. Calcified aorta. BONES AND SOFT TISSUES: Cervical fusion hardware noted. No acute osseous abnormality. ABDOMEN: Paucity of visible bowel gas in the abdomen. IMPRESSION: 1. Increasing dense left lung base opacity, likely in part due to pleural effusion, but also suspicious for pneumonia. Increasing streaky right lung base opacity, atelectasis vs infection. 2. Cardiomegaly. Electronically signed by: Helayne Hurst MD 10/06/2024 09:05 AM EST RP Workstation: [id number]    PMH:   Past Medical History:  Diagnosis Date   Carotid artery occlusion 11/10/2010   LEFT CAROTID ENDARTERECTOMY   Complication of anesthesia    BP WENT UP AT [facility]    COPD (chronic obstructive pulmonary disease) (HCC)    pt denies this dx as of 06/01/20 - no inhaler    Diabetes mellitus without complication (HCC)    Diverticulitis    Diverticulosis of colon (without mention of hemorrhage)    DJD (degenerative joint disease)    knees/hands/feet/back/neck   ESRD (end stage  renal disease) on dialysis (HCC)    Fatty liver    Full dentures    GERD (gastroesophageal reflux disease)    H/O hiatal hernia    History of blood transfusion    with a past surical procedure per patient 06/01/20   History of right below knee amputation (HCC) 07/12/2016   Hyperlipidemia    Hypertension    Neuromuscular disorder (HCC)    peripheral neuropathy   Non-pressure chronic ulcer of other part of left foot limited to breakdown of skin (HCC) 11/12/2016   Nonrheumatic aortic (valve) stenosis 03/18/2024   Limited TTE 03/18/2024: EF 55-60, moderate LVH, normal RVSF, moderate LAE, mild MR, mild-moderate AS (mean 12, V-max 235 cm/s, DI 0.35), aortic root 39 mm, moderate pleural effusion, trivial pericardial effusion    Osteomyelitis (HCC)    left 5th metatarsal  PAD (peripheral artery disease)    Distal aortogram June 2012. Atherectomy left popliteal artery July 2012.    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   Unstable angina (HCC) 09/16/2018   Wears glasses     PSH:   Past Surgical History:  Procedure Laterality Date   ABDOMINAL AORTOGRAM W/LOWER EXTREMITY N/A 06/23/2021   Procedure: ABDOMINAL AORTOGRAM W/LOWER EXTREMITY;  Surgeon: Elmira Newman PARAS, MD;  [facility]: [facility];  Service: Cardiovascular;  Laterality: N/A;   AMPUTATION  11/05/2011   Procedure: AMPUTATION RAY;  Surgeon: Norleen Armor, MD;  Location: [facility] OR;  Service: Orthopedics;  Laterality: Right;  Amputation of Right 4&5th Toes   AMPUTATION Left 11/26/2012   Procedure: AMPUTATION RAY;  Surgeon: Norleen Armor, MD;  Location: [facility] OR;  Service: Orthopedics;  Laterality: Left;  fourth ray amputation   AMPUTATION Right 08/27/2014   Procedure: Transmetatarsal Amputation;  Surgeon:  Jerona Harden GAILS, MD;  Location: [facility] OR;  Service: Orthopedics;  Laterality: Right;   AMPUTATION Right 01/14/2015   Procedure: AMPUTATION BELOW KNEE;  Surgeon: Jerona Harden GAILS, MD;  Location: [facility] OR;  Service: Orthopedics;  Laterality: Right;   AMPUTATION Left 10/21/2015   Procedure: Left Foot 5th Ray Amputation;  Surgeon: Jerona Harden GAILS, MD;  Location: [facility] OR;  Service: Orthopedics;  Laterality: Left;   AMPUTATION Left 07/04/2022   Procedure: LEFT BELOW KNEE AMPUTATION;  Surgeon: Harden Jerona GAILS, MD;  Location: [facility] OR;  Service: Orthopedics;  Laterality: Left;   AMPUTATION FINGER Left 09/23/2024   Procedure: AMPUTATION, FINGER;  Surgeon: Arlinda Buster, MD;  Location: [facility] OR;  Service: Orthopedics;  Laterality: Left;  LEFT RING FINGER AMPUTATION   ANTERIOR FUSION CERVICAL SPINE  02/06/2006   C4-5, C5-6, C6-7; SURGEON DR. MAX COHEN   AV FISTULA PLACEMENT Left 06/02/2020   Procedure: ARTERIOVENOUS (AV) FISTULA CREATION LEFT;  Surgeon: Sheree Penne Bruckner, MD;  Location: [facility] OR;  Service: Vascular;  Laterality: Left;   BACK SURGERY     x 3   BASCILIC VEIN TRANSPOSITION Left 07/21/2020   Procedure: LEFT UPPER ARM ATERIOVENOUS SUPERFISTULALIZATION;  Surgeon: Sheree Penne Bruckner, MD;  Location: [facility] OR;  Service: Vascular;  Laterality: Left;   BELOW KNEE LEG AMPUTATION Right    CARDIAC CATHETERIZATION  10/31/2004   2009   CAROTID ENDARTERECTOMY  11/10/2010   CAROTID ENDARTERECTOMY Left 11/10/2010   Subtotal occlusion of left internal carotid artery with left hemispheric transient ischemic attacks.   CAROTID STENT     CARPAL TUNNEL RELEASE Right 10/21/2013   Procedure: RIGHT CARPAL TUNNEL RELEASE;  Surgeon: Arley JONELLE Curia, MD;  Location: [facility];  Service: Orthopedics;  Laterality: Right;   CHOLECYSTECTOMY     COLON SURGERY     COLONOSCOPY     COLOSTOMY REVERSAL  05/21/2018   ileostomy reversal   CORONARY LITHOTRIPSY N/A 11/24/2023   Procedure: CORONARY LITHOTRIPSY;  Surgeon:  Jordan, Peter M, MD;  Location: [facility] INVASIVE CV LAB;  Service: Cardiovascular;  Laterality: N/A;   CORONARY STENT INTERVENTION N/A 11/24/2023   Procedure: CORONARY STENT INTERVENTION;  Surgeon: Jordan, Peter M, MD;  Location: [facility] INVASIVE CV LAB;  Service: Cardiovascular;  Laterality: N/A;   CYSTOSCOPY WITH STENT PLACEMENT Bilateral 01/13/2018  Procedure: CYSTOSCOPY WITH BILATERAL URETERAL CATHETER PLACEMENT;  Surgeon: Cam Morene ORN, MD;  Location: [facility] ORS;  Service: Urology;  Laterality: Bilateral;   ESOPHAGEAL MANOMETRY Bilateral 07/19/2014   Procedure: ESOPHAGEAL MANOMETRY (EM);  Surgeon: Gordy CHRISTELLA Starch, MD;  Location: [facility];  Service: Gastroenterology;  Laterality: Bilateral;   EYE SURGERY Bilateral 2020   cataract   FEMORAL ARTERY STENT     x6   FINGER SURGERY     FOOT SURGERY  04/25/2016    EXCISION BASE 5TH METATARSAL AND PARTIAL CUBOID LEFT FOOT   HERNIA REPAIR     LEFT INGUINAL AND UMBILICAL REPAIRS   HERNIA REPAIR     I & D EXTREMITY Left 04/25/2016   Procedure: EXCISION BASE 5TH METATARSAL AND PARTIAL CUBOID LEFT FOOT;  Surgeon: Jerona LULLA Sage, MD;  Location: [facility];  Service: Orthopedics;  Laterality: Left;   ILEOSTOMY  01/13/2018   Procedure: ILEOSTOMY;  Surgeon: Signe Mitzie LABOR, MD;  Location: [facility]S;  Service: General;;   ILEOSTOMY CLOSURE N/A 05/21/2018   Procedure: ILEOSTOMY REVERSAL ERAS PATHWAY;  Surgeon: Signe Mitzie LABOR, MD;  Location: [facility];  Service: General;  Laterality: N/A;   IR RADIOLOGIST EVAL & MGMT  11/19/2017   IR RADIOLOGIST EVAL & MGMT  12/03/2017   IR RADIOLOGIST EVAL & MGMT  12/18/2017   JOINT REPLACEMENT Right 2001   Total knee   LAMINECTOMY     X 3 LUMBAR AND X 2 CERVICAL SPINE OPERATIONS   LAPAROSCOPIC CHOLECYSTECTOMY W/ CHOLANGIOGRAPHY  11/09/2004   SURGEON DR. DEWARD CANDIE NULL   LEFT HEART CATH AND CORONARY ANGIOGRAPHY N/A 09/16/2018   Procedure: LEFT HEART CATH AND CORONARY ANGIOGRAPHY;  Surgeon: Elmira Newman PARAS, MD;  [facility]  INV[facility];  Service: Cardiovascular;  Laterality: N/A;   LEFT HEART CATH AND CORONARY ANGIOGRAPHY N/A 11/24/2023   Procedure: LEFT HEART CATH AND CORONARY ANGIOGRAPHY;  Surgeon: Jordan, Peter M, MD;  Location[facility] INVASIVE CV LAB;  Service: Cardiovascular;  Laterality: N/A;   LEFT HEART CATHETERIZATION WITH CORONARY ANGIOGRAM N/A 10/29/2014   Procedure: LEFT HEART CATHETERIZATION WITH CORONARY ANGIOGRAM;  Surgeon: Erick JONELLE Bergamo, MD;  Location: [facility] CATH LAB;  Service: Cardiovascular;  Laterality: N/A;   LIGATION OF COMPETING BRANCHES OF ARTERIOVENOUS FISTULA Left 07/21/2020   Procedure: LIGATION OF COMPETING BRANCHES OF LEFT UPPER ARM ARTERIOVENOUS FISTULA;  Surgeon: Sheree Penne Bruckner, MD;  Location: [facility] OR;  Service: Vascular;  Laterality: Left;   LOWER EXTREMITY ANGIOGRAM N/A 03/19/2012   Procedure: LOWER EXTREMITY ANGIOGRAM;  Surgeon: Bruckner JONETTA Cash, MD;  Location: [facility] CATH LAB;  Service: Cardiovascular;  Laterality: N/A;   LOWER EXTREMITY ANGIOGRAPHY N/A 06/20/2021   Procedure: LOWER EXTREMITY ANGIOGRAPHY;  Surgeon: Elmira Newman PARAS, MD;  [facility];  Service: Cardiovascular;  Laterality: N/A;   NECK SURGERY     PARTIAL COLECTOMY N/A 01/13/2018   Procedure: LAPAROSCOPIC ASSISTED   SIGMOID COLECTOMY ILEOSTOMY;  Surgeon: Signe Mitzie LABOR, MD;  Location: [facility] ORS;  Service: General;  Laterality: N/A;   PENILE PROSTHESIS IMPLANT  08/14/2005   INFRAPUBIC INSERTION OF INFLATABLE PENILE PROSTHESIS; SURGEON DR. JANIT   PENILE PROSTHESIS IMPLANT     PERCUTANEOUS CORONARY STENT INTERVENTION (PCI-S) Right 10/29/2014   Procedure: PERCUTANEOUS CORONARY STENT INTERVENTION (PCI-S);  Surgeon: Erick JONELLE Bergamo, MD;  Location: [facility] CATH LAB;  Service: Cardiovascular;  Laterality: Right;   PERICARDIOCENTESIS N/A 02/13/2024   Procedure: PERICARDIOCENTESIS;  Surgeon: Elmira Newman PARAS, MD;  [facility];  Service:  Cardiovascular;  Laterality: N/A;    PERIPHERAL VASCULAR INTERVENTION Left 06/23/2021   Procedure: PERIPHERAL VASCULAR INTERVENTION;  Surgeon: Elmira Newman PARAS, MD;  [facility];  Service: Cardiovascular;  Laterality: Left;   REMOVAL OF PENILE PROSTHESIS N/A 06/14/2021   Procedure: Removal of THREE piece inflatable penile prosthesis;  Surgeon: Carolee Sherwood JONETTA DOUGLAS, MD;  Location: [facility] OR;  Service: Urology;  Laterality: N/A;   SHOULDER ARTHROSCOPY     SPINE SURGERY     TEMPORARY PACEMAKER N/A 02/13/2024   Procedure: TEMPORARY PACEMAKER;  Surgeon: Elmira Newman PARAS, MD;  [facility]: [facility];  Service: Cardiovascular;  Laterality: N/A;   TOE AMPUTATION Left    TONSILLECTOMY     TOTAL KNEE ARTHROPLASTY  07/2002   RIGHT KNEE ; SURGEON  DR. [name] ALSO HAD ARTHROSCOPIC RIGHT KNEE IN  10/2001   TOTAL KNEE ARTHROPLASTY     ULNAR NERVE TRANSPOSITION Right 10/21/2013   Procedure: RIGHT ELBOW  ULNAR NERVE DECOMPRESSION;  Surgeon: Arley JONELLE Curia, MD;  Location: [facility];  Service: Orthopedics;  Laterality: Right;    Allergies: Allergies[1]  Medications:   Prior to Admission medications  Medication Sig Start Date End Date Taking? Authorizing Provider  apixaban  (ELIQUIS ) 5 MG TABS tablet Take 1 tablet (5 mg total) by mouth 2 (two) times daily. 01/14/22 05/10/25 Yes Gonfa, Taye T, MD  atorvastatin  (LIPITOR ) 80 MG tablet Take 1 tablet (80 mg total) by mouth at bedtime. Patient taking differently: Take 80 mg by mouth every evening. 11/25/23  Yes Amoako, Prince, MD  carvedilol  (COREG ) 12.5 MG tablet Take 1 tablet (12.5 mg total) by mouth 2 (two) times daily with a meal. 09/25/24  Yes Adhikari, Amrit, MD  colchicine  0.6 MG tablet Take 0.5 tablets (0.3 mg total) by mouth daily. Patient taking differently: Take 0.3 mg by mouth every evening. 02/21/24  Yes Kathrin Mignon DASEN, MD  CREAM BASE EX Apply 1 Application topically in the morning and at bedtime. Apply to incontinence area  PRN order: apply twice daily as  needed for barrier cream   Yes [provider]  ethyl chloride spray Apply 1 Application topically See admin instructions. Apply to vascular access in the morning on Tuesday, Thursday, and Saturday. 09/14/24  Yes [provider]  famotidine  (PEPCID ) 20 MG tablet Take 20 mg by mouth at bedtime.   Yes [provider]  fentaNYL  (DURAGESIC ) 12 MCG/HR Place 1 patch onto the skin every 3 (three) days. 09/04/24  Yes Samtani, Jai-Gurmukh, MD  ferrous sulfate  325 (65 FE) MG tablet Take 325 mg by mouth every evening.   Yes [provider]  folic acid  (FOLVITE ) 1 MG tablet Take 1 mg by mouth every evening. 09/18/21  Yes [provider]  gabapentin  (NEURONTIN ) 100 MG capsule Take 100 mg by mouth 3 (three) times daily.   Yes [provider]  insulin  aspart (NOVOLOG  FLEXPEN) 100 UNIT/ML FlexPen Inject 4-12 Units into the skin See admin instructions. Inject 4 units subcutaneously three times daily before meals, in ADDITION to 0-12 units per sliding scale: 70 - 150 : 0 units 151-200 : 2 units 201-250 : 4 units 251-300 : 6 units 301-350 : 8 units 351-400 : 10 units 401-450 : 12 units > 400 : give 12 units and recheck in 1 hour - if still > 400, call MD/NP for further orders.   Yes [provider]  oxyCODONE  (OXY IR/ROXICODONE ) 5 MG immediate release tablet Take 1 tablet (5 mg total)  by mouth every 6 (six) hours as needed for moderate pain (pain score 4-6) or severe pain (pain score 7-10). 09/02/24  Yes Samtani, Jai-Gurmukh, MD  Phenylephrine  HCl (PREPARATION H) 0.25 % SUPP Place 1 suppository rectally every 6 (six) hours as needed (hemorrhoids).   Yes [provider]  polyethylene glycol (MIRALAX  / GLYCOLAX ) 17 g packet Take 17 g by mouth daily as needed for moderate constipation. 09/02/24  Yes Samtani, Jai-Gurmukh, MD  rOPINIRole  (REQUIP ) 2 MG tablet Take 2 mg by mouth at bedtime.   Yes [provider]  senna (SENOKOT) 8.6 MG TABS  tablet Take 17.2 mg by mouth every evening.   Yes [provider]  sevelamer  carbonate (RENVELA ) 800 MG tablet Take 1 tablet (800 mg total) by mouth 3 (three) times daily with meals. 09/02/24  Yes Samtani, Jai-Gurmukh, MD  tamsulosin  (FLOMAX ) 0.4 MG CAPS capsule Take 0.4 mg by mouth every evening.   Yes [provider]  amoxicillin  (AMOXIL ) 500 MG capsule Take 500 mg by mouth 2 (two) times daily. Patient not taking: Reported on 10/06/2024    [provider]    Discontinued Meds:   Medications Discontinued During This Encounter  Medication Reason   ceFEPIme  (MAXIPIME ) 2 g in sodium chloride  0.9 % 100 mL IVPB    vancomycin  (VANCOCIN ) IVPB 1000 mg/200 mL premix    barrier cream (NON-SPECIFIED) CREA Change in therapy   folic acid -vitamin b complex-vitamin c -selenium-zinc  (DIALYVITE ) 3 MG TABS tablet Discontinued by provider   Wound Cleansers (VASHE WOUND) SOLN Discontinued by provider   sodium chloride  0.9 % bolus 1,000 mL    Chlorhexidine  Gluconate Cloth 2 % PADS 6 each Duplicate   etomidate  (AMIDATE ) injection    rocuronium  (ZEMURON ) injection    naloxone  (NARCAN ) injection    vancomycin  (VANCOCIN ) IVPB 1000 mg/200 mL premix    feeding supplement (PROSource TF20) liquid 60 mL    feeding supplement (NEPRO CARB STEADY) liquid 1,000 mL    fentaNYL  (SUBLIMAZE ) injection 50 mcg    fentaNYL  (SUBLIMAZE ) injection 50-200 mcg    fentaNYL  (SUBLIMAZE ) injection 25 mcg     Social History:  reports that he has been smoking cigarettes. He has a 35 pack-year smoking history. He has never used smokeless tobacco. He reports that he does not currently use alcohol . He reports that he does not use drugs.  Family History:   Family History  Problem Relation Age of Onset   Heart disease Father        Before age 71-  CAD, BPG   Diabetes Father        Amputation   Cancer Father        PROSTATE   Hyperlipidemia Father    Hypertension Father    Heart attack Father        Triple  BPG   Varicose Veins Father    Cancer Sister        Breast   Hyperlipidemia Sister    Hypertension Sister    Heart attack Brother    Colon cancer Brother    Diabetes Brother    Heart disease Brother 38       A-Fib. Before age 53   Hyperlipidemia Brother    Hypertension Brother    Hypertension Son    Arthritis Other        GRANDMOTHER   Hypertension Other        OTHER FAMILY MEMBERS    Blood pressure 99/63, pulse 95, temperature 99.1 F (37.3 C), temperature source Oral,  resp. rate 19, height 6' 2 (1.88 m), weight 106.3 kg, SpO2 100%. Physical Exam: General: extubated, ill appearing male  Head: NCAT Neck: JVD not elevated. Lungs: CTA b/l anteriorly Heart: RRR with normal S1, S2.  Abdomen: Soft, non-distended with normoactive bowel sounds.  Musculoskeletal:  Strength and tone appear normal for age. Lower extremities: b/l BKAs, no edema noted Dialysis Access: Lt BCF + T/b      MELIA LYNWOOD ORN, MD 10/08/2024, 7:39 AM      [1]  Allergies Allergen Reactions   Contrast Media [Iodinated Contrast Media] Shortness Of Breath and Other (See Comments)    Altered mental status     Ivp Dye [Iodinated Contrast Media] Anaphylaxis, Shortness Of Breath and Other (See Comments)    Altered mental status     Latex Rash    severe rash appears after the first 24 hours of being placed   Tape Rash and Other (See Comments)    Rash after 1 day of use  Do not leave on longer then 3 days without changing

## 2024-10-08 NOTE — Progress Notes (Signed)
 Pt. Awake and only oriented to self and situation. Consent on file and verified. Started with no complications  Tx duration: 4 hours UF removal: 0ml   Access used: Left AVF Access issue: Still have a small blood coming out from the previous site on the venous site  Dr. Lynwood Farr, ordered to keep even UF.  Tx completed and tolerated Pressure dressing applied Endorsed to floor nurse  Estanislao Auther Shope, RN [facility]

## 2024-10-08 NOTE — Progress Notes (Signed)
 PHARMACY - ANTICOAGULATION CONSULT NOTE  Pharmacy Consult for heparin  Indication: atrial fibrillation  Allergies[1]  Patient Measurements: Height: 6' 2 (188 cm) Weight: 97.9 kg (215 lb 13.3 oz) IBW/kg (Calculated) : 82.2 HEPARIN  DW (KG): 89.5  Vital Signs: Temp: 100.1 F (37.8 C) (01/28 2300) Temp Source: Oral (01/28 2300) BP: 85/71 (01/29 0300) Pulse Rate: 97 (01/29 0300)  Labs: Recent Labs    10/06/24 0809 10/06/24 0845 10/06/24 1215 10/06/24 1323 10/06/24 1455 10/07/24 0324 10/07/24 1443 10/08/24 0229  HGB 10.1* 10.5*   < > 11.2*  --  12.4*  --  10.0*  HCT 33.0* 31.0*   < > 33.0*  --  39.2  --  32.6*  PLT 164  --   --   --   --  167  --  154  APTT  --   --   --   --   --  35 60* 60*  LABPROT 31.5*  --   --   --   --   --   --   --   INR 2.9*  --   --   --   --   --   --   --   HEPARINUNFRC  --   --   --   --   --  >1.10* >1.10*  --   CREATININE 8.04* 8.40*  --   --  7.57* 8.23*  --   --    < > = values in this interval not displayed.    Estimated Creatinine Clearance: 9.7 mL/min (A) (by C-G formula based on SCr of 8.23 mg/dL (H)).  Assessment: Patient with history of Afib on Eliquis  admitted from SNF with AMS and hypoxia. Last dose of Eliquis  1/26 8 pm. Noted to be ESRD. Pharmacy consulted to dose heparin . Will need to monitor aPTT and anti-Xa due to recent Eliquis .   Last heparin  level > 1.1 as expected given recent Eliquis  exposure. aPTT 60 seconds x2 despite recent dose increase (subtherapeutic).  No pauses or infusion issues per RN. No bleeding reported.   Goal of Therapy:  Heparin  level 0.3-0.7 units/ml aPTT 66-102 Monitor platelets by anticoagulation protocol: Yes   Plan:  Increase heparin  infusion to 1700 units/hr Check aPTT in 8 hours and aPTT/heparin  level daily while on heparin  Continue to monitor H&H and platelets F/u transition back to Eliquis  when able   Lynwood Poplar, PharmD, BCPS Clinical Pharmacist 10/08/2024 3:17 AM       [1]   Allergies Allergen Reactions   Contrast Media [Iodinated Contrast Media] Shortness Of Breath and Other (See Comments)    Altered mental status     Ivp Dye [Iodinated Contrast Media] Anaphylaxis, Shortness Of Breath and Other (See Comments)    Altered mental status     Latex Rash    severe rash appears after the first 24 hours of being placed   Tape Rash and Other (See Comments)    Rash after 1 day of use  Do not leave on longer then 3 days without changing

## 2024-10-08 NOTE — Evaluation (Signed)
 Clinical/Bedside Swallow Evaluation Patient Details  Name: [name] MRN: [id number] Date of Birth: 13-Aug-1956  Today's Date: 10/08/2024 Time: SLP Start Time (ACUTE ONLY): 9065 SLP Stop Time (ACUTE ONLY): 1000 SLP Time Calculation (min) (ACUTE ONLY): 26 min  Past Medical History:  Past Medical History:  Diagnosis Date   Carotid artery occlusion 11/10/2010   LEFT CAROTID ENDARTERECTOMY   Complication of anesthesia    BP WENT UP AT [facility]    COPD (chronic obstructive pulmonary disease) (HCC)    pt denies this dx as of 06/01/20 - no inhaler    Diabetes mellitus without complication (HCC)    Diverticulitis    Diverticulosis of colon (without mention of hemorrhage)    DJD (degenerative joint disease)    knees/hands/feet/back/neck   ESRD (end stage renal disease) on dialysis (HCC)    Fatty liver    Full dentures    GERD (gastroesophageal reflux disease)    H/O hiatal hernia    History of blood transfusion    with a past surical procedure per patient 06/01/20   History of right below knee amputation (HCC) 07/12/2016   Hyperlipidemia    Hypertension    Neuromuscular disorder (HCC)    peripheral neuropathy   Non-pressure chronic ulcer of other part of left foot limited to breakdown of skin (HCC) 11/12/2016   Nonrheumatic aortic (valve) stenosis 03/18/2024   Limited TTE 03/18/2024: EF 55-60, moderate LVH, normal RVSF, moderate LAE, mild MR, mild-moderate AS (mean 12, V-max 235 cm/s, DI 0.35), aortic root 39 mm, moderate pleural effusion, trivial pericardial effusion    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 Atherectomy left popliteal artery July 2012.    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   Unstable angina (HCC) 09/16/2018   Wears glasses    Past Surgical History:  Past Surgical History:  Procedure Laterality Date   ABDOMINAL AORTOGRAM W/LOWER EXTREMITY N/A 06/23/2021   Procedure: ABDOMINAL AORTOGRAM W/LOWER EXTREMITY;  Surgeon: Elmira Newman PARAS, MD;  [facility]: [facility];  Service: Cardiovascular;  Laterality: N/A;   AMPUTATION  11/05/2011   Procedure: AMPUTATION RAY;  Surgeon: Norleen Armor, MD;  Location: [facility] OR;  Service: Orthopedics;  Laterality: Right;  Amputation of Right 4&5th Toes   AMPUTATION Left 11/26/2012   Procedure: AMPUTATION RAY;  Surgeon: Norleen Armor, MD;  Location: [facility] OR;  Service: Orthopedics;  Laterality: Left;  fourth ray amputation   AMPUTATION Right 08/27/2014   Procedure: Transmetatarsal Amputation;  Surgeon: Jerona Harden GAILS, MD;  Location: [facility] OR;  Service: Orthopedics;  Laterality: Right;   AMPUTATION Right 01/14/2015   Procedure: AMPUTATION BELOW KNEE;  Surgeon: Jerona Harden GAILS, MD;  Location: [facility] OR;  Service: Orthopedics;  Laterality: Right;   AMPUTATION Left 10/21/2015   Procedure: Left Foot 5th Ray Amputation;  Surgeon: Jerona Harden GAILS, MD;  Location: [facility] OR;  Service: Orthopedics;  Laterality: Left;   AMPUTATION Left 07/04/2022   Procedure: LEFT BELOW KNEE AMPUTATION;  Surgeon: Harden Jerona GAILS, MD;  Location: [facility] OR;  Service: Orthopedics;  Laterality: Left;   AMPUTATION FINGER Left 09/23/2024   Procedure: AMPUTATION, FINGER;  Surgeon: Arlinda Buster, MD;  Location: [facility] OR;  Service: Orthopedics;  Laterality: Left;  LEFT RING FINGER AMPUTATION   ANTERIOR FUSION CERVICAL SPINE  02/06/2006   C4-5, C5-6, C6-7; SURGEON DR. MAX COHEN   AV FISTULA PLACEMENT Left 06/02/2020   Procedure: ARTERIOVENOUS (AV) FISTULA CREATION LEFT;  Surgeon: Sheree Penne Bruckner, MD;  Location: [facility] OR;  Service: Vascular;  Laterality: Left;   BACK SURGERY     x 3   BASCILIC VEIN TRANSPOSITION Left 07/21/2020   Procedure: LEFT UPPER ARM ATERIOVENOUS  SUPERFISTULALIZATION;  Surgeon: Sheree Penne Bruckner, MD;  Location: [facility] OR;  Service: Vascular;  Laterality: Left;   BELOW KNEE LEG AMPUTATION Right    CARDIAC CATHETERIZATION  10/31/2004   2009   CAROTID ENDARTERECTOMY  11/10/2010   CAROTID ENDARTERECTOMY Left 11/10/2010   Subtotal occlusion of left internal carotid artery with left hemispheric transient ischemic attacks.   CAROTID STENT     CARPAL TUNNEL RELEASE Right 10/21/2013   Procedure: RIGHT CARPAL TUNNEL RELEASE;  Surgeon: Arley JONELLE Curia, MD;  Location: [facility];  Service: Orthopedics;  Laterality: Right;   CHOLECYSTECTOMY     COLON SURGERY     COLONOSCOPY     COLOSTOMY REVERSAL  05/21/2018   ileostomy reversal   CORONARY LITHOTRIPSY N/A 11/24/2023   Procedure: CORONARY LITHOTRIPSY;  Surgeon: Jordan, Peter M, MD;  Location: [facility] INVASIVE CV LAB;  Service: Cardiovascular;  Laterality: N/A;   CORONARY STENT INTERVENTION N/A 11/24/2023   Procedure: CORONARY STENT INTERVENTION;  Surgeon: Jordan, Peter M, MD;  Location: [facility] INVASIVE CV LAB;  Service: Cardiovascular;  Laterality: N/A;   CYSTOSCOPY WITH STENT PLACEMENT Bilateral 01/13/2018   Procedure: CYSTOSCOPY WITH BILATERAL URETERAL CATHETER PLACEMENT;  Surgeon: Cam Morene ORN, MD;  Location: [facility] ORS;  Service: Urology;  Laterality: Bilateral;   ESOPHAGEAL MANOMETRY Bilateral 07/19/2014   Procedure: ESOPHAGEAL MANOMETRY (EM);  Surgeon: Gordy CHRISTELLA Starch, MD;  Location: [facility];  Service: Gastroenterology;  Laterality: Bilateral;   EYE SURGERY Bilateral 2020   cataract   FEMORAL ARTERY STENT     x6   FINGER SURGERY     FOOT SURGERY  04/25/2016    EXCISION BASE 5TH METATARSAL AND PARTIAL CUBOID LEFT FOOT   HERNIA REPAIR     LEFT INGUINAL AND UMBILICAL REPAIRS   HERNIA REPAIR     I & D EXTREMITY Left 04/25/2016   Procedure: EXCISION BASE 5TH METATARSAL AND PARTIAL CUBOID LEFT FOOT;  Surgeon: Jerona LULLA Sage, MD;  Location: [facility];  Service: Orthopedics;   Laterality: Left;   ILEOSTOMY  01/13/2018   Procedure: ILEOSTOMY;  Surgeon: Signe Mitzie LABOR, MD;  Location: [facility] ORS;  Service: General;;   ILEOSTOMY CLOSURE N/A 05/21/2018   Procedure: ILEOSTOMY REVERSAL ERAS PATHWAY;  Surgeon: Signe Mitzie LABOR, MD;  Location: [facility];  Service: General;  Laterality: N/A;   IR RADIOLOGIST EVAL & MGMT  11/19/2017   IR RADIOLOGIST EVAL & MGMT  12/03/2017   IR RADIOLOGIST EVAL & MGMT  12/18/2017   JOINT REPLACEMENT Right 2001   Total knee   LAMINECTOMY     X 3 LUMBAR AND X 2 CERVICAL SPINE OPERATIONS   LAPAROSCOPIC CHOLECYSTECTOMY W/ CHOLANGIOGRAPHY  11/09/2004   SURGEON DR. DEWARD CANDIE NULL   LEFT HEART CATH AND CORONARY ANGIOGRAPHY N/A 09/16/2018   Procedure: LEFT HEART CATH AND CORONARY ANGIOGRAPHY;  Surgeon: Elmira Newman PARAS, MD;  [facility];  Service: Cardiovascular;  Laterality: N/A;   LEFT HEART CATH AND CORONARY ANGIOGRAPHY N/A 11/24/2023  Procedure: LEFT HEART CATH AND CORONARY ANGIOGRAPHY;  Surgeon: Jordan, Peter M, MD;  Location: [facility] INVASIVE CV LAB;  Service: Cardiovascular;  Laterality: N/A;   LEFT HEART CATHETERIZATION WITH CORONARY ANGIOGRAM N/A 10/29/2014   Procedure: LEFT HEART CATHETERIZATION WITH CORONARY ANGIOGRAM;  Surgeon: Erick JONELLE Bergamo, MD;  Location: [facility] CATH LAB;  Service: Cardiovascular;  Laterality: N/A;   LIGATION OF COMPETING BRANCHES OF ARTERIOVENOUS FISTULA Left 07/21/2020   Procedure: LIGATION OF COMPETING BRANCHES OF LEFT UPPER ARM ARTERIOVENOUS FISTULA;  Surgeon: Sheree Penne Bruckner, MD;  Location: [facility] OR;  Service: Vascular;  Laterality: Left;   LOWER EXTREMITY ANGIOGRAM N/A 03/19/2012   Procedure: LOWER EXTREMITY ANGIOGRAM;  Surgeon: Bruckner JONETTA Cash, MD;  Location: [facility] CATH LAB;  Service: Cardiovascular;  Laterality: N/A;   LOWER EXTREMITY ANGIOGRAPHY N/A 06/20/2021   Procedure: LOWER EXTREMITY ANGIOGRAPHY;  Surgeon: Elmira Newman PARAS, MD;  [facility];  Service:  Cardiovascular;  Laterality: N/A;   NECK SURGERY     PARTIAL COLECTOMY N/A 01/13/2018   Procedure: LAPAROSCOPIC ASSISTED   SIGMOID COLECTOMY ILEOSTOMY;  Surgeon: Signe Mitzie LABOR, MD;  Location: [facility]S;  Service: General;  Laterality: N/A;   PENILE PROSTHESIS IMPLANT  08/14/2005   INFRAPUBIC INSERTION OF INFLATABLE PENILE PROSTHESIS; SURGEON DR. JANIT   PENILE PROSTHESIS IMPLANT     PERCUTANEOUS CORONARY STENT INTERVENTION (PCI-S) Right 10/29/2014   Procedure: PERCUTANEOUS CORONARY STENT INTERVENTION (PCI-S);  Surgeon: Erick JONELLE Bergamo, MD;  Location: [facility] CATH LAB;  Service: Cardiovascular;  Laterality: Right;   PERICARDIOCENTESIS N/A 02/13/2024   Procedure: PERICARDIOCENTESIS;  Surgeon: Elmira Newman PARAS, MD;  [facility];  Service: Cardiovascular;  Laterality: N/A;   PERIPHERAL VASCULAR INTERVENTION Left 06/23/2021   Procedure: PERIPHERAL VASCULAR INTERVENTION;  Surgeon: Elmira Newman PARAS, MD;  [facility];  Service: Cardiovascular;  Laterality: Left;   REMOVAL OF PENILE PROSTHESIS N/A 06/14/2021   Procedure: Removal of THREE piece inflatable penile prosthesis;  Surgeon: Carolee Sherwood JONETTA DOUGLAS, MD;  Location: [facility] OR;  Service: Urology;  Laterality: N/A;   SHOULDER ARTHROSCOPY     SPINE SURGERY     TEMPORARY PACEMAKER N/A 02/13/2024   Procedure: TEMPORARY PACEMAKER;  Surgeon: Elmira Newman PARAS, MD;  [facility]: [facility];  Service: Cardiovascular;  Laterality: N/A;   TOE AMPUTATION Left    TONSILLECTOMY     TOTAL KNEE ARTHROPLASTY  07/2002   RIGHT KNEE ; SURGEON  DR. [name] ALSO HAD ARTHROSCOPIC RIGHT KNEE IN  10/2001   TOTAL KNEE ARTHROPLASTY     ULNAR NERVE TRANSPOSITION Right 10/21/2013   Procedure: RIGHT ELBOW  ULNAR NERVE DECOMPRESSION;  Surgeon: Arley JONELLE Curia, MD;  Location: [facility];  Service: Orthopedics;  Laterality: Right;   HPI:  54 yr M with history of tobacco use, A-fib on Eliquis  DM, COPD, severe PAD s/p B/L BKA,  ESRD on HD, presents from SNF for AMS and hypoxia, unintentional opiate overdose, had 2 fentanyl  patches on him on admission, Neurontin  toxicity. Head CT without acute event, remote infarcts, LLL PNA. Intubated for airway protection 1/27, extubated 1/28. Of note, 3 recent hospitalization since 12/2 for volume overload, unintentional  opiate overdose, and recent 09/28/24  for metabolic encephalopathy, sepsis secondary to PNA with proteus bacteremia, and underwent amputation of left ring finger for gangrene on 1/15 discharged to SNF.    Assessment / Plan / Recommendation  Clinical Impression  Patient presents with similar function as during previous swallowing evaluations. He demonstrates intact and  functional oropharyngeal swallowing abilities without overt s/s of aspiration. He has however had multiple swallowing evaluations at bedside, about one per year since 2023 all noting suspicion for a mild dysphagia, possibly esophageal in nature. Given repeat admission with PNA, instrumental swallowing exam recommended to evaluate swallowing physiology and r/o aspiration as potential contribution to pulmondary infection. Recommend initiation of conservative diet as patient does not have dentures and is moderately lethargic requiring cues to maintain adequate leve of alertness for pos. Discussed with MD who is in agreement to allow a po diet today and proceed with MBS next date. SLP Visit Diagnosis: Dysphagia, unspecified (R13.10)          Other Recommendations Oral Care Recommendations: Oral care BID     Swallow Evaluation Recommendations Recommendations: PO diet PO Diet Recommendation: Dysphagia 2 (Finely chopped);Thin liquids (Level 0) Liquid Administration via: Cup;Straw Medication Administration: Crushed with puree (or whole with puree) Supervision: Full assist for feeding;Full supervision/cueing for swallowing strategies Swallowing strategies  : Slow rate;Small bites/sips Postural changes: Position pt  fully upright for meals Oral care recommendations: Oral care BID (2x/day)     Swallow Study   General HPI: 67 yr M with history of tobacco use, A-fib on Eliquis  DM, COPD, severe PAD s/p B/L BKA, ESRD on HD, presents from SNF for AMS and hypoxia, unintentional opiate overdose, had 2 fentanyl  patches on him on admission, Neurontin  toxicity. Head CT without acute event, remote infarcts, LLL PNA. Intubated for airway protection 1/27, extubated 1/28. Of note, 3 recent hospitalization since 12/2 for volume overload, unintentional  opiate overdose, and recent 09/28/24  for metabolic encephalopathy, sepsis secondary to PNA with proteus bacteremia, and underwent amputation of left ring finger for gangrene on 1/15 discharged to SNF. Type of Study: Bedside Swallow Evaluation Previous Swallow Assessment: seen in 08/2024 for bedside swallow exam, placed initially on dysphagia 1, thin liquids, advanced to dysphagia 2 thin liquid prior to d/c. Diet Prior to this Study: NPO Temperature Spikes Noted: Yes Respiratory Status: Nasal cannula History of Recent Intubation: Yes Total duration of intubation (days): 1 days Date extubated: 10/07/24 Behavior/Cognition: Lethargic/Drowsy;Distractible;Requires cueing;Pleasant mood Oral Cavity Assessment: Within Functional Limits Oral Care Completed by SLP: Recent completion by staff Oral Cavity - Dentition: Dentures, not available Vision: Functional for self-feeding Self-Feeding Abilities: Total assist Patient Positioning: Upright in bed Baseline Vocal Quality: Low vocal intensity Volitional Cough: Strong Volitional Swallow: Able to elicit    Oral/Motor/Sensory Function Overall Oral Motor/Sensory Function: Within functional limits   Ice Chips Ice chips: Within functional limits Presentation: Spoon   Thin Liquid Thin Liquid: Within functional limits Presentation: Straw (did not test cup as patient leaning to the left, on HD, needs full assist currently for feeding,  would likely result in anterior labial spillage)    Nectar Thick Nectar Thick Liquid: Not tested   Honey Thick Honey Thick Liquid: Not tested   Puree Puree: Within functional limits Presentation: Spoon   Solid     Solid: Within functional limits (soft solids, midssing dentition) Presentation: Spoon     Leslie Langille MA, CCC-SLP  Aleisha Paone Meryl 10/08/2024,11:37 AM

## 2024-10-08 NOTE — Progress Notes (Signed)
 "  NAME:  [name], MRN:  [id number], DOB:  10-31-1955, LOS: 2 ADMISSION DATE:  10/06/2024,  CONSULTATION DATE:  10/06/24 REFERRING MD:  Warren NOVAK, PA, CHIEF COMPLAINT:  weakness   History of Present Illness:  [name] is a 69 yo M with Mhx of Tobacco abuse, Afib on eliquis , DM, OSA, COPD, HTN, HLD, CAD, PAD s/p bilateral BKA, ESRD on HD, anemia, GERD, chronic pain, and sacral decubitus presenting to ED from SNF for weakness   Of note, 3 recent hospitalization since 12/2 for volume overload, unintentional  opiate overdose, and recent 09/28/24  for metabolic encephalopathy, sepsis secondary to PNA with proteus bacteremia, and underwent amputation of left ring finger for gangrene on 1/15 discharged to SNF.  Last iHD on 10/03/24.   Sent from [facility] for lethargy, confusion, and generalized weakness for last 2-3 days, found hypoxic at 80% on RA, tachycardic, temp 100.5, febrile, and hypotensive with SBP in the 60's initially improved after 1.5 L of IVF but still altered and SBP 80-90s.  Labs with WBC 9.5, H/H 10.1/ 33, lactic 2.2> 0.8 after fluids, pBNP > 35,000, glucose 167, BUN/sCr 59/ 8, albumin  3.3, ALP 128, INR 2.9, ammonia 63, SARS/ flu/ RSV neg.  Given concern for sepsis, blood cultures sent, started on empiric cefepime  and vancomycin .  One fentanyl  12.5mcg patch was removed by ER.  PCCM consulted given altered mental status and lower Bps.     On PCCM evaluation, pt minimally responsive to noxious stimuli with more shallow respirations.  Additional fentanyl  patch found and removed from right shoulder.  S/p narcan  x 2 in ER with improvement in BP and pt starting to move more, some concern for left facial droop and not as much activity in LUE and some movements, possibly myoclonus.  Plans for stat CTH. Neuro consulted.  VBG 7.356/ 52.  Called and spoke with wife who confirmed pt was a full code for now.   Pt was intubated while in ER for airway protection prior to [facility].   Pertinent  Medical  History   Past Medical History:  Diagnosis Date   Carotid artery occlusion 11/10/2010   LEFT CAROTID ENDARTERECTOMY   Complication of anesthesia    BP WENT UP AT [facility]    COPD (chronic obstructive pulmonary disease) (HCC)    pt denies this dx as of 06/01/20 - no inhaler    Diabetes mellitus without complication (HCC)    Diverticulitis    Diverticulosis of colon (without mention of hemorrhage)    DJD (degenerative joint disease)    knees/hands/feet/back/neck   ESRD (end stage renal disease) on dialysis (HCC)    Fatty liver    Full dentures    GERD (gastroesophageal reflux disease)    H/O hiatal hernia    History of blood transfusion    with a past surical procedure per patient 06/01/20   History of right below knee amputation (HCC) 07/12/2016   Hyperlipidemia    Hypertension    Neuromuscular disorder (HCC)    peripheral neuropathy   Non-pressure chronic ulcer of other part of left foot limited to breakdown of skin (HCC) 11/12/2016   Nonrheumatic aortic (valve) stenosis 03/18/2024   Limited TTE 03/18/2024: EF 55-60, moderate LVH, normal RVSF, moderate LAE, mild MR, mild-moderate AS (mean 12, V-max 235 cm/s, DI 0.35), aortic root 39 mm, moderate pleural effusion, trivial pericardial effusion    Osteomyelitis (HCC)    left 5th metatarsal   PAD (peripheral artery disease)    Distal  aortogram June 2012. Atherectomy left popliteal artery July 2012.    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  Objective    Blood pressure (!) 101/58, pulse (!) 112, temperature 98.6 F (37 C), resp. rate (!) 21, height 6' 2 (1.88 m), weight 97.3 kg, SpO2 100%.    Vent Mode: PSV;CPAP FiO2 (%):  [40 %] 40 % PEEP:  [5 cmH20] 5 cmH20 Pressure Support:  [5 cmH20-12 cmH20] 5 cmH20 Plateau Pressure:  [16 cmH20] 16 cmH20   Intake/Output Summary (Last 24 hours) at 10/08/2024 1124 Last data filed at 10/08/2024 1000 Gross per 24 hour  Intake 778.53 ml  Output --  Net 778.53 ml   Filed Weights   10/07/24 0900 10/08/24 0500 10/08/24 0836  Weight: 97.9 kg 106.3 kg 97.3 kg    Examination: No distress Moves to command Answering appropriately BL BKA L hand wound stable RASS -1 Lungs sound okay, nonlabored breathing pattern Irregular, BBB on tele stable  Resolved problem list   Assessment and Plan   Gabapentin  and opiate toxicity- in ESRD patient, improving with time and HD; had 2x fent patches on admission when supposed to only have 1; appreciate neuro input Chronic pain management Restless leg syndrome Oxy for now As he wakes up more could consider resuming fent patch again Hold gabapentin  indefinitely for now Ropinarole can be resumed if has RLS symptoms  Staph epi bacteremia thinking more c/w contaminant but reasonable given presentation to give 5 days  Mechanical ventilation- extubated 1/28 Hx COPD and OSA Hx tobacco use disorder Decubitus ulcers Multiple abrasions and wound on R hand L 4th finger gragrene s/pamputation.  Nebs to PRN 5 days vanc; check repeat blood cultures Wound care per discussion 1/28 note OP ortho f/u  ESRD on iHD TTS - iHD per nephrology  DM Last A1c 6.9 Sugars have been fine, DC fingersticks  Hx of remote CVA Afib on AC HFmrEF Mild RV failure Moderate AS CAD Severe PAD s/p BL BKA - statin; should be on aspirin  also with his cardiac hx (had bleed with DAPT after PCI, has not been on ASA alone + AC) - dc  heparin  gtt, restart eliquis  - Continue statin  Dysphagia- intermittent issue, cleared for diet but agree with SLP MBSS would be helpful MBSS tomorrow Modified consistency diet  GERD - PTA H2b resumed  Anemia of ESRD Stable >12  Muscular deconditioning- PT/OT input appreciated  GOC Admitting provider discussed with wife; full code  Stable for progressive, appreciate TRH taking over starting 10/09/24  Rolan Sharps MD PCCM      "

## 2024-10-08 NOTE — Progress Notes (Signed)
 Called pt's spouse Katheryn to inform her about pt's transfer to 4E. Received voicemail.

## 2024-10-08 NOTE — Progress Notes (Signed)
" °   10/08/24 1311  Vitals  Temp 98.3 F (36.8 C)  Pulse Rate (!) 105  Resp 20  BP 117/83  SpO2 100 %  Weight 97.3 kg  Type of Weight Post-Dialysis  Oxygen  Therapy  Patient Activity (if Appropriate) In bed  Pulse Oximetry Type Continuous  Oximetry Probe Site Changed No  Post Treatment  Dialyzer Clearance Lightly streaked  Liters Processed 96  Fluid Removed (mL) 0 mL  Tolerated HD Treatment Yes  Post-Hemodialysis Comments Pt. tolerated tx well  AVG/AVF Arterial Site Held (minutes) 10 minutes  AVG/AVF Venous Site Held (minutes) 10 minutes    "

## 2024-10-09 ENCOUNTER — Inpatient Hospital Stay (HOSPITAL_COMMUNITY)

## 2024-10-09 LAB — CULTURE, BLOOD (ROUTINE X 2): Special Requests: ADEQUATE

## 2024-10-09 MED ORDER — CHLORHEXIDINE GLUCONATE CLOTH 2 % EX PADS
6.0000 | MEDICATED_PAD | Freq: Every day | CUTANEOUS | Status: DC
  Administered 2024-10-11 – 2024-10-13 (×2): 6 via TOPICAL

## 2024-10-09 NOTE — Progress Notes (Addendum)
 Remains afebrile PROGRESS NOTE  [name]  FMW:[id number] DOB: [birth year] DOA: 10/06/2024 PCP: Montey Lot, PA-C   Brief Narrative: Patient is a 69 year old male with history of bilateral BKA, chronic A-fib on Eliquis , ESRD on dialysis on TTS schedule, insulin -dependent diabetes type 2, hypertension, moderate aortic stenosis, chronic pain syndrome, chronic right-sided systolic heart failure who presented from SNF being encephalopathic, decreased left upper extremity movement, left facial droop.  On presentation, he was lethargic, tachycardic, hypoxic on room air, had marked fever and was hypotensive.  Patient was intubated for airway protection.  Concern for sepsis, started on empiric antibiotics.  Found to have 2 fentanyl  patch on the back, concern for polypharmacy, given Narcan .  Neurology also consulted.  He was initially recently admitted and was discharged in early January and at that time he was treated for pneumonia, proteus  bacteremia and also underwent amputation of left ring finger by orthopedics.   He was admitted under PCCM service this time.  Neurology, nephrology consulted.Extubated on 1/28.  Patient transferred to TRH service on 1/30. Remains confused today, eventual plan is for discharge to SNF  Assessment & Plan:  Active Problems:   Acute encephalopathy   Malnutrition of moderate degree   Acute metabolic encephalopathy: Presented with concern for stroke also with decreased left upper extremity movement, left facial droop.  Neurology consulted.  MRI of the brain, MRA head and neck did not show acute findings, showed old infarcts. Polypharmacy is a strong possibility, finding of 2 fentanyl  patches .  Was also on gabapentin .  Given Narcan . Mentation improved with dialysis.  Only on  oxycodone  for now.  Hold gabapentin  indefinitely.  Neurology signed off. Continue to monitor mentation.  Remains confused today.  Obeys commands  Sepsis secondary to pneumonia/ESBL ecoli in  sputum:   Blood culture sent here showed Staph epidermidis, suspected  contamination.  Currently on vancomycin , plan was for total  5 days of treatment  as per PCCM,given on dialysis. Respiratory culture showed ESBL E. coli, currently on meropenem .  Remains afebrile, no leukocytosis.  Repeat blood cultures sent on 1/29  Acute hypoxic respiratory failure/history of COPD/OSA: Intubated on arrival for airway protection.  Has been extubated on 1/28.  Currently on room air.  Not in COPD exacerbation.  History of tobacco use  ESRD on dialysis: Nephrology following for dialysis.  Has AV fistula on the left upper extremity  Left hand fourth finger gangrene:  S/P  amputation on last admission.  Orthopedics recommended to continue dressing until the outpatient follow-up in 7-10 days.  Wound care was consulted.  Message sent to Dr. Arlinda who did surgery for him requesting for follow-up while he is inpatient  Chronic A-fib: Monitor on telemetry.  On Eliquis  .Takes Coreg  for rate control.  Remains in A-fib..  Type 2 diabetes: Continue current insulin  regimen.  Monitor blood sugars.  Recent A1c of 6.9.  Hyperlipidemia: On Lipitor   History of moderate aortic stenosis: Patient echo showed EF of 50 to 55%, moderate aortic stenosis.  We recommend to follow-up with cardiology as an outpatient  Anemia chronic disease: Currently hemoglobin stable  Debility/deconditioning: Patient residing at [facility].  Patient has bilateral BKA.  On statin for peripheral artery disease.  On dysphagia 2 diet  Goals of care: Goals of care discussed with wife.  Remains full code.  Family is expecting full scope of treatment  Wound 08/25/24 2047 Pressure Injury Sacrum Stage 2 -  Partial thickness loss of dermis presenting as a shallow open injury  with a red, pink wound bed without slough. (Active)       Nutrition Problem: Moderate Malnutrition Etiology: acute illness, chronic illness Wound 08/25/24 2047 Pressure  Injury Sacrum Stage 2 -  Partial thickness loss of dermis presenting as a shallow open injury with a red, pink wound bed without slough. (Active)    DVT prophylaxis: apixaban  (ELIQUIS ) tablet 5 mg     Code Status: Full Code  Family Communication:Called and discussed with wife Katheryn on phone on 1/30  Patient status: Inpatient  Patient is from : SNF  Anticipated discharge to: SNF  Estimated DC date: 2-3 days   Consultants: Nephrology,critical care  Procedures: Dialysis,intubation  Antimicrobials:  Anti-infectives (From admission, onward)    Start     Dose/Rate Route Frequency Ordered Stop   10/10/24 1200  vancomycin  (VANCOCIN ) IVPB 1000 mg/200 mL premix        1,000 mg 200 mL/hr over 60 Minutes Intravenous Every T-Th-Sa (Hemodialysis) 10/08/24 1449 10/13/24 1159   10/08/24 1530  meropenem  (MERREM ) 1 g in sodium chloride  0.9 % 100 mL IVPB        1 g 200 mL/hr over 30 Minutes Intravenous Every 24 hours 10/08/24 1441 10/15/24 1529   10/08/24 1445  vancomycin  (VANCOCIN ) IVPB 1000 mg/200 mL premix        1,000 mg 200 mL/hr over 60 Minutes Intravenous  Once 10/08/24 1347 10/08/24 1501   10/08/24 1200  vancomycin  (VANCOCIN ) IVPB 1000 mg/200 mL premix  Status:  Discontinued        1,000 mg 200 mL/hr over 60 Minutes Intravenous Every T-Th-Sa (Hemodialysis) 10/06/24 1458 10/07/24 0954   10/07/24 1235  vancomycin  variable dose per unstable renal function (pharmacist dosing)  Status:  Discontinued         Does not apply See admin instructions 10/07/24 1235 10/08/24 1450   10/07/24 1045  vancomycin  (VANCOCIN ) IVPB 1000 mg/200 mL premix        1,000 mg 200 mL/hr over 60 Minutes Intravenous  Once 10/07/24 0954 10/07/24 1254   10/06/24 1400  piperacillin -tazobactam (ZOSYN ) IVPB 2.25 g  Status:  Discontinued        2.25 g 100 mL/hr over 30 Minutes Intravenous Every 8 hours 10/06/24 1245 10/08/24 1133   10/06/24 0845  ceFEPIme  (MAXIPIME ) 1 g in sodium chloride  0.9 % 100 mL IVPB        1  g 200 mL/hr over 30 Minutes Intravenous  Once 10/06/24 0832 10/06/24 0930   10/06/24 0845  vancomycin  (VANCOREADY) IVPB 2000 mg/400 mL        2,000 mg 200 mL/hr over 120 Minutes Intravenous  Once 10/06/24 0832 10/06/24 1059   10/06/24 0830  vancomycin  (VANCOCIN ) IVPB 1000 mg/200 mL premix  Status:  Discontinued        1,000 mg 200 mL/hr over 60 Minutes Intravenous  Once 10/06/24 0827 10/06/24 0832   10/06/24 0830  ceFEPIme  (MAXIPIME ) 2 g in sodium chloride  0.9 % 100 mL IVPB  Status:  Discontinued        2 g 200 mL/hr over 30 Minutes Intravenous  Once 10/06/24 0827 10/06/24 9167       Subjective: Patient seen and examined at bedside today.  Hemodynamically stable.  Lying in bed.  On room air.  Not in any kind of respiratory distress.  Remains in A-fib rhythm with heart rate in the range of 90-100.  He speaks and replies questions  but remains disoriented.  Not agitated..  Objective: Vitals:   10/08/24 1815 10/08/24  1958 10/08/24 2339 10/09/24 0500  BP: (!) 82/58 (!) 89/59 104/77 110/71  Pulse: (!) 119 (!) 57 60 (!) 35  Resp: (!) 24 17 20  (!) 25  Temp:  98.5 F (36.9 C) 98.6 F (37 C) 98.4 F (36.9 C)  TempSrc:  Oral Oral Oral  SpO2:  100% 97% 99%  Weight:    105.7 kg  Height:        Intake/Output Summary (Last 24 hours) at 10/09/2024 0748 Last data filed at 10/08/2024 1629 Gross per 24 hour  Intake 341.23 ml  Output 0 ml  Net 341.23 ml   Filed Weights   10/08/24 0836 10/08/24 1311 10/09/24 0500  Weight: 97.3 kg 97.3 kg 105.7 kg    Examination:  General exam: Overall comfortable, not in distress, chronically deconditioned, lying on bed HEENT: PERRL Respiratory system:  no wheezes or crackles, diminished sounds on bilateral bases Cardiovascular system:: Irregularly irregular rhythm Gastrointestinal system: Abdomen is nondistended, soft and nontender. Central nervous system: Alert and awake but not oriented Extremities: Bilateral BKA, dressing on the left hand, AV  fistula in the left upper extremity Skin: pressure ulcer as above   Data Reviewed: I have personally reviewed following labs and imaging studies  CBC: Recent Labs  Lab 10/06/24 0809 10/06/24 0845 10/06/24 1215 10/06/24 1323 10/07/24 0324 10/08/24 0229  WBC 9.5  --   --   --  9.5 8.0  NEUTROABS 7.4  --   --   --   --   --   HGB 10.1* 10.5* 11.6* 11.2* 12.4* 10.0*  HCT 33.0* 31.0* 34.0* 33.0* 39.2 32.6*  MCV 95.9  --   --   --  93.1 93.7  PLT 164  --   --   --  167 154   Basic Metabolic Panel: Recent Labs  Lab 10/06/24 0809 10/06/24 0845 10/06/24 1215 10/06/24 1323 10/06/24 1455 10/07/24 0324 10/08/24 0229  NA 141 141 142 140 143 141 139  K 4.9 4.6 4.8 4.6 4.8 5.6* 4.3  CL 99 100  --   --  100 96* 96*  CO2 25  --   --   --  24 19* 23  GLUCOSE 167* 160*  --   --  158* 170* 124*  BUN 59* 54*  --   --  60* 70* 42*  CREATININE 8.04* 8.40*  --   --  7.57* 8.23* 6.14*  CALCIUM  8.7*  --   --   --  8.1* 8.7* 8.0*  MG  --   --   --   --  2.2  --   --   PHOS  --   --   --   --   --  5.4* 3.8     Recent Results (from the past 240 hours)  Blood Culture (routine x 2)     Status: Abnormal   Collection Time: 10/06/24  8:05 AM   Specimen: BLOOD RIGHT FOREARM  Result Value Ref Range Status   Specimen Description BLOOD RIGHT FOREARM  Final   Special Requests   Final    BOTTLES DRAWN AEROBIC AND ANAEROBIC Blood Culture adequate volume   Culture  Setup Time   Final    GRAM POSITIVE COCCI IN CLUSTERS IN BOTH AEROBIC AND ANAEROBIC BOTTLES CRITICAL RESULT CALLED TO, READ BACK BY AND VERIFIED WITH: PHARMD B WANNARAT [id number] AT 916 AM BY CM    Culture (A)  Final    STAPHYLOCOCCUS EPIDERMIDIS THE SIGNIFICANCE OF ISOLATING THIS ORGANISM FROM A  SINGLE SET OF BLOOD CULTURES WHEN MULTIPLE SETS ARE DRAWN IS UNCERTAIN. PLEASE NOTIFY THE MICROBIOLOGY DEPARTMENT WITHIN ONE WEEK IF SPECIATION AND SENSITIVITIES ARE REQUIRED. Performed at [facility], [address], [place], [place]  [address]    Report Status 10/09/2024 FINAL  Final  Blood Culture ID Panel (Reflexed)     Status: Abnormal   Collection Time: 10/06/24  8:05 AM  Result Value Ref Range Status   Enterococcus faecalis NOT DETECTED NOT DETECTED Final   Enterococcus Faecium NOT DETECTED NOT DETECTED Final   Listeria monocytogenes NOT DETECTED NOT DETECTED Final   Staphylococcus species DETECTED (A) NOT DETECTED Final    Comment: CRITICAL RESULT CALLED TO, READ BACK BY AND VERIFIED WITH: PHARMD B WANNRAT [id number] AT 915 BY CM    Staphylococcus aureus (BCID) NOT DETECTED NOT DETECTED Final   Staphylococcus epidermidis DETECTED (A) NOT DETECTED Final    Comment: Methicillin (oxacillin) resistant coagulase negative staphylococcus. Possible blood culture contaminant (unless isolated from more than one blood culture draw or clinical case suggests pathogenicity). No antibiotic treatment is indicated for blood  culture contaminants. CRITICAL RESULT CALLED TO, READ BACK BY AND VERIFIED WITH: PHARMD B WANNATAR [id number] AT 915 BY CM    Staphylococcus lugdunensis NOT DETECTED NOT DETECTED Final   Streptococcus species NOT DETECTED NOT DETECTED Final   Streptococcus agalactiae NOT DETECTED NOT DETECTED Final   Streptococcus pneumoniae NOT DETECTED NOT DETECTED Final   Streptococcus pyogenes NOT DETECTED NOT DETECTED Final   A.calcoaceticus-baumannii NOT DETECTED NOT DETECTED Final   Bacteroides fragilis NOT DETECTED NOT DETECTED Final   Enterobacterales NOT DETECTED NOT DETECTED Final   Enterobacter cloacae complex NOT DETECTED NOT DETECTED Final   Escherichia coli NOT DETECTED NOT DETECTED Final   Klebsiella aerogenes NOT DETECTED NOT DETECTED Final   Klebsiella oxytoca NOT DETECTED NOT DETECTED Final   Klebsiella pneumoniae NOT DETECTED NOT DETECTED Final   Proteus species NOT DETECTED NOT DETECTED Final   Salmonella species NOT DETECTED NOT DETECTED Final   Serratia marcescens NOT DETECTED NOT DETECTED Final    Haemophilus influenzae NOT DETECTED NOT DETECTED Final   Neisseria meningitidis NOT DETECTED NOT DETECTED Final   Pseudomonas aeruginosa NOT DETECTED NOT DETECTED Final   Stenotrophomonas maltophilia NOT DETECTED NOT DETECTED Final   Candida albicans NOT DETECTED NOT DETECTED Final   Candida auris NOT DETECTED NOT DETECTED Final   Candida glabrata NOT DETECTED NOT DETECTED Final   Candida krusei NOT DETECTED NOT DETECTED Final   Candida parapsilosis NOT DETECTED NOT DETECTED Final   Candida tropicalis NOT DETECTED NOT DETECTED Final   Cryptococcus neoformans/gattii NOT DETECTED NOT DETECTED Final   Methicillin resistance mecA/C DETECTED (A) NOT DETECTED Final    Comment: CRITICAL RESULT CALLED TO, READ BACK BY AND VERIFIED WITH: PHARMD B MIGDALIA [id number] AT 915 BY CM Performed at [facility], [address] [place], [place] [address]   Resp panel by RT-PCR (RSV, Flu A&B, Covid) Anterior Nasal Swab     Status: None   Collection Time: 10/06/24  9:31 AM   Specimen: Anterior Nasal Swab  Result Value Ref Range Status   SARS Coronavirus 2 by RT PCR NEGATIVE NEGATIVE Final   Influenza A by PCR NEGATIVE NEGATIVE Final   Influenza B by PCR NEGATIVE NEGATIVE Final    Comment: (NOTE) The Xpert Xpress SARS-CoV-2/FLU/RSV plus assay is intended as an aid in the diagnosis of influenza from Nasopharyngeal swab specimens and should not be used as a sole basis  for treatment. Nasal washings and aspirates are unacceptable for Xpert Xpress SARS-CoV-2/FLU/RSV testing.  Fact Sheet for Patients: bloggercourse.com  Fact Sheet for Healthcare Providers: seriousbroker.it  This test is not yet approved or cleared by the [place]  FDA and has been authorized for detection and/or diagnosis of SARS-CoV-2 by FDA under an Emergency Use Authorization (EUA). This EUA will remain in effect (meaning this test can be used) for the duration of the COVID-19  declaration under Section 564(b)(1) of the Act, 21 U.S.C. section 360bbb-3(b)(1), unless the authorization is terminated or revoked.     Resp Syncytial Virus by PCR NEGATIVE NEGATIVE Final    Comment: (NOTE) Fact Sheet for Patients: bloggercourse.com  Fact Sheet for Healthcare Providers: seriousbroker.it  This test is not yet approved or cleared by the [place]  FDA and has been authorized for detection and/or diagnosis of SARS-CoV-2 by FDA under an Emergency Use Authorization (EUA). This EUA will remain in effect (meaning this test can be used) for the duration of the COVID-19 declaration under Section 564(b)(1) of the Act, 21 U.S.C. section 360bbb-3(b)(1), unless the authorization is terminated or revoked.  Performed at [facility], [address], [place], [place] [address]   Culture, Respiratory w Gram Stain     Status: None   Collection Time: 10/06/24 12:36 PM   Specimen: Tracheal Aspirate; Respiratory  Result Value Ref Range Status   Specimen Description TRACHEAL ASPIRATE  Final   Special Requests NONE  Final   Gram Stain   Final    FEW WBC PRESENT, PREDOMINANTLY PMN FEW GRAM POSITIVE COCCI Performed at [facility], [address] [place], [place] [address]    Culture   Final    FEW ESCHERICHIA COLI Confirmed Extended Spectrum Beta-Lactamase Producer (ESBL).  In bloodstream infections from ESBL organisms, carbapenems are preferred over piperacillin /tazobactam. They are shown to have a lower risk of mortality.    Report Status 10/08/2024 FINAL  Final   Organism ID, Bacteria ESCHERICHIA COLI  Final      Susceptibility   Escherichia coli - MIC*    AMPICILLIN  >=32 RESISTANT Resistant     CEFAZOLIN  (NON-URINE) >=32 RESISTANT Resistant     CEFEPIME  >=32 RESISTANT Resistant     ERTAPENEM <=0.12 SENSITIVE Sensitive     CEFTRIAXONE  >=64 RESISTANT Resistant     CIPROFLOXACIN  >=4 RESISTANT Resistant      GENTAMICIN  <=1 SENSITIVE Sensitive     MEROPENEM  <=0.25 SENSITIVE Sensitive     TRIMETH /SULFA  >=320 RESISTANT Resistant     AMPICILLIN /SULBACTAM >=32 RESISTANT Resistant     PIP/TAZO Value in next row Sensitive      8 SENSITIVEThis is a modified FDA-approved test that has been validated and its performance characteristics determined by the reporting laboratory.  This laboratory is certified under the Clinical Laboratory Improvement Amendments CLIA as qualified to perform high complexity clinical laboratory testing.    * FEW ESCHERICHIA COLI  MRSA Next Gen by PCR, Nasal     Status: Abnormal   Collection Time: 10/06/24  1:00 PM  Result Value Ref Range Status   MRSA by PCR Next Gen DETECTED (A) NOT DETECTED Final    Comment: RESULT CALLED TO, READ BACK BY AND VERIFIED WITH: RN Tillman on [phone] @1830  by SM (NOTE) The GeneXpert MRSA Assay (FDA approved for NASAL specimens only), is one component of a comprehensive MRSA colonization surveillance program. It is not intended to diagnose MRSA infection nor to guide or monitor treatment for MRSA infections. Test performance is  not FDA approved in patients less than 65 years old. Performed at [facility], [address], [place], [place] [address]   Blood Culture (routine x 2)     Status: None (Preliminary result)   Collection Time: 10/06/24  7:52 PM   Specimen: BLOOD LEFT HAND  Result Value Ref Range Status   Specimen Description BLOOD LEFT HAND  Final   Special Requests   Final    BOTTLES DRAWN AEROBIC AND ANAEROBIC Blood Culture adequate volume   Culture   Final    NO GROWTH 3 DAYS Performed at [facility], [address], [place], [place] [address]    Report Status PENDING  Incomplete  Culture, blood (Routine X 2) w Reflex to ID Panel     Status: None (Preliminary result)   Collection Time: 10/08/24  1:29 PM   Specimen: BLOOD RIGHT HAND  Result Value Ref Range Status   Specimen Description BLOOD RIGHT HAND  Final   Special  Requests   Final    BOTTLES DRAWN AEROBIC AND ANAEROBIC Blood Culture results may not be optimal due to an inadequate volume of blood received in culture bottles   Culture   Final    NO GROWTH < 24 HOURS Performed at [facility], [address], [place], [place] [address]    Report Status PENDING  Incomplete  Culture, blood (Routine X 2) w Reflex to ID Panel     Status: None (Preliminary result)   Collection Time: 10/08/24  1:29 PM   Specimen: BLOOD RIGHT HAND  Result Value Ref Range Status   Specimen Description BLOOD RIGHT HAND  Final   Special Requests AEROBIC BOTTLE ONLY Blood Culture adequate volume  Final   Culture   Final    NO GROWTH < 24 HOURS Performed at [facility], [address] [place], [place] [address]    Report Status PENDING  Incomplete     Radiology Studies: Overnight EEG with video Result Date: 10/07/2024 Shelton Arlin KIDD, MD     10/08/2024  9:34 AM Patient Name: CINDY BRINDISI MRN: [id number] Epilepsy Attending: Arlin KIDD Shelton Referring Physician/Provider: Antonetta Vina NOVAK, NP Duration: 10/07/2024 0125 to  1941 Patient history: 69yo M with 2 to 3-day history of increased lethargy. EEG to evaluate for seizure Level of alertness: comatose/ lethargic AEDs during EEG study: None Technical aspects: This EEG study was done with scalp electrodes positioned according to the 10-20 International system of electrode placement. Electrical activity was reviewed with band pass filter of 1-70Hz , sensitivity of 7 uV/mm, display speed of 55mm/sec with a 60Hz  notched filter applied as appropriate. EEG data were recorded continuously and digitally stored.  Video monitoring was available and reviewed as appropriate. Description: EEG showed continuous generalized 3 to 6 Hz theta-delta slowing admixed with 15 to 18 Hz beta activity distributed symmetrically and diffusely. Hyperventilation and photic stimulation were not performed.   EEG was disconnected between 10/07/2024 0244 to  0538 ABNORMALITY - Continuous slow, generalized IMPRESSION: This study is suggestive of severe diffuse encephalopathy, nonspecific etiology but likely related to sedation. No seizures or epileptiform discharges were seen throughout the recording. Priyanka O Yadav     Scheduled Meds:  apixaban   5 mg Oral BID   atorvastatin   80 mg Oral Daily   Chlorhexidine  Gluconate Cloth  6 each Topical Daily   Chlorhexidine  Gluconate Cloth  6 each Topical Q0600   famotidine   10 mg Oral QODAY   feeding supplement  237 mL Oral BID BM   lactulose   10 g Oral BID   multivitamin  1 tablet Oral QHS   mupirocin  ointment   Nasal BID   nutrition supplement (JUVEN)  1 packet Oral BID BM   Continuous Infusions:  anticoagulant sodium citrate      meropenem  (MERREM ) IV 200 mL/hr at 10/08/24 1629   [START ON 10/10/2024] vancomycin         LOS: 3 days   Ivonne Mustache, MD [facility] P1/30/2026, 7:48 AM

## 2024-10-09 NOTE — Procedures (Addendum)
 Modified Barium Swallow Study  Patient Details  Name: [name] MRN: [id number] Date of Birth: [birth year]  Today's Date: 10/09/2024  Modified Barium Swallow completed.  Full report located under Chart Review in the Imaging Section.  History of Present Illness 26 yr M with history of tobacco use, A-fib on Eliquis  DM, COPD, severe PAD s/p B/L BKA, ESRD on HD, presents from SNF for AMS and hypoxia, unintentional opiate overdose, had 2 fentanyl  patches on him on admission, Neurontin  toxicity. Head CT without acute event, remote infarcts, LLL PNA. Intubated for airway protection 1/27, extubated 1/28. Of note, 3 recent hospitalization since 12/2 for volume overload, unintentional  opiate overdose, and recent 09/28/24  for metabolic encephalopathy, sepsis secondary to PNA with proteus bacteremia, and underwent amputation of left ring finger for gangrene on 1/15 discharged to SNF.   Clinical Impression Pt seen for MBSS. Pt presents with a moderate oral dysphagia and grossly functional pharyngeal swallow c/b prolonged/inefficient mastication and A-P transit as well as trace-mild oral and pharyngeal residual. Deficits most pronouced with solids. Residual reduced with liquid wash. Suspect oral deficits exacerbated by mental status (pt talking incessantly, difficult to redirect) and edentulism. Pt's body habitus prohibitive of esophageal screening despite respositioning efforts. Recommend continuation of a Dysphagia 2 Diet with Thin Liquids with safe swallowing strategies/aspiration precautions as outlined below. ST to sign off at this level of care. Recommend skilled ST services at next level of care for dysphagia management and possible diet upgrade. Factors that may increase risk of adverse event in presence of aspiration Noe & Lianne 2021): Poor general health and/or compromised immunity;Limited mobility;Dependence for feeding and/or oral hygiene (AMS)  Swallow Evaluation  Recommendations Recommendations: PO diet PO Diet Recommendation: Dysphagia 2 (Finely chopped);Thin liquids (Level 0) Liquid Administration via: Spoon;Cup;Straw Medication Administration: Crushed with puree (vs whole wth puree) Supervision: Full assist for feeding;Full supervision/cueing for swallowing strategies Swallowing strategies  : Minimize environmental distractions;Follow solids with liquids Postural changes: Position pt fully upright for meals Oral care recommendations: Oral care BID (2x/day);Staff/trained caregiver to provide oral care     [name], M.S., CCC-SLP Speech-Language Pathologist Secure Chat Preferred  O: [phone]   [name] 10/09/2024,10:35 AM

## 2024-10-09 NOTE — Plan of Care (Signed)
" °  Problem: Metabolic: Goal: Ability to maintain appropriate glucose levels will improve Outcome: Progressing   Problem: Activity: Goal: Ability to tolerate increased activity will improve Outcome: Progressing   Problem: Clinical Measurements: Goal: Diagnostic test results will improve Outcome: Progressing Goal: Respiratory complications will improve Outcome: Progressing Goal: Cardiovascular complication will be avoided Outcome: Progressing   Problem: Activity: Goal: Risk for activity intolerance will decrease Outcome: Progressing   "

## 2024-10-09 NOTE — Evaluation (Signed)
 Physical Therapy Evaluation & Discharge Patient Details Name: [name] MRN: [id number] DOB: 1956/08/07 Today's Date: 10/09/2024  History of Present Illness  Pt is a 69 y.o. male who presented 10/06/24 from SNF with AMS and hypoxia. Concern noted for L facial droop and decreased L UE movement. ETT 1/27-1/28. No acute stroke seen on imaging. Per Neurologist, suspect that his acute onset movement disorder with AMS was due to gabapentin  toxicity in the setting of his ESRD. Opiate overdose may have played a role in his acute AMS, but would not be expected to precipitate acute asterixis. 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 PTA, he was a long-term resident at a SNF, dependent on staff for daily hoyer lift transfers OOB to his power w/c. He is mod I managing his power w/c. This is all per the pt's report and per prior entries. Currently, the pt is a questionable historian and difficult to understand due to him mumbling. He follows cues inconsistently and often needs redirecting to pay attention to cues by PT, but ultimately needed total assist for all bed mobility and max-total assist for static sitting balance at EOB. He displays limitations in bil knee extension ROM due to hamstring tightness along with limitations in cognition, balance, and gross strength. He appears to be at his functional baseline, but his cognition appears like it may be below his baseline. As he is at his functional baseline and reports he has been working with PT at a his SNF to strengthen and stretch his legs and potentially try to use his prostheses, then will defer further skilled PT to his therapists at his SNF. Will sign off acutely.        If plan is discharge home, recommend the following:  Two people to help with walking and/or transfers;Two people to help with bathing/dressing/bathroom;Assistance with cooking/housework;Direct supervision/assist for medications management;Direct supervision/assist for financial management;Assist for transportation;Supervision due to cognitive status   Can travel by private vehicle   No    Equipment Recommendations None recommended by PT  Recommendations for Other Services       Functional Status Assessment Patient has had a recent decline in their functional status and demonstrates the ability to make significant improvements in function in a reasonable and predictable amount of time.     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 Restrictions Weight Bearing Restrictions Per Provider Order: Yes LUE Weight Bearing Per Provider Order: Non weight bearing (L hand due to recent 4th finger amputation 09/23/24)      Mobility  Bed Mobility Overal bed mobility: Needs Assistance Bed Mobility: Rolling, Supine to Sit, Sit to Supine Rolling: Total assist   Supine to sit: Total assist, HOB elevated Sit to supine: Total assist, HOB elevated   General bed mobility comments: Pt not following cues consistently and often tangential and difficult to gain attention to assist with bed mobility, needing total assist to roll to the R, transition superiorly in bed with bed in trendelenburg position, and transition supine <> sit R EOB.    Transfers                   General transfer comment: hoyer lift transfer at baseline, deferred due to  pt about to go down for a test with transport    Ambulation/Gait               General Gait Details: does not ambulate at baseline  Stairs            Wheelchair Mobility     Tilt Bed    Modified Rankin (Stroke Patients Only)       Balance Overall balance assessment: Needs  assistance Sitting-balance support: Single extremity supported, No upper extremity supported, Feet unsupported Sitting balance-Leahy Scale: Zero Sitting balance - Comments: Pt with poor core activation, needing max-total assist for static sitting balance at EOB with intermittent R UE support. Postural control: Posterior lean     Standing balance comment: does not stand at baseline                             Pertinent Vitals/Pain Pain Assessment Pain Assessment: Faces Faces Pain Scale: Hurts little more Pain Location: generalized with mobility Pain Descriptors / Indicators: Discomfort, Grimacing Pain Intervention(s): Limited activity within patient's tolerance, Monitored during session, Repositioned    Home Living Family/patient expects to be discharged to:: Skilled nursing facility                   Additional Comments: resides at [facility]    Prior Function Prior Level of Function : Needs assist       Physical Assist : Mobility (physical);ADLs (physical) Mobility (physical): Bed mobility;Transfers ADLs (physical): Bathing;Dressing;Toileting;IADLs Mobility Comments: hoyer lift to power w/c, can navigate power w/c mod I with joystick once in it ADLs Comments: wears depends, bathing and dressing at bed level with assistance from staff; per prior report in December 2025, reports staff only assists with LB ADLs; able to complete grooming tasks on his own     Extremity/Trunk Assessment   Upper Extremity Assessment Upper Extremity Assessment: LUE deficits/detail;Generalized weakness (weakness noted functionally) LUE Deficits / Details: recent 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, bandage noted; NWB L hand    Lower Extremity Assessment Lower Extremity Assessment: Generalized weakness;RLE deficits/detail;LLE deficits/detail RLE Deficits / Details: hx of BKA, skin intact; MMT scores of 2+ hip flexion, 4 knee extension; able to abduct/adduct hip but not through full ROM  while supine, demonstrating weakness; Hamstring tightness noted with knee extension PROM limited by ~15' LLE Deficits / Details: hx of BKA, skin intact; MMT scores of 2+ hip flexion, 4 knee extension; able to abduct/adduct hip but not through full ROM while supine, demonstrating weakness; Hamstring tightness noted with knee extension PROM limited by ~15'    Cervical / Trunk Assessment Cervical / Trunk Assessment: Normal  Communication   Communication Communication: Impaired Factors Affecting Communication: Reduced clarity of speech    Cognition Arousal: Alert Behavior During Therapy: Anxious   PT - Cognitive impairments: No family/caregiver present to determine baseline, Difficult to assess Difficult to assess due to: Impaired communication                     PT - Cognition Comments: Pt mumbles and is difficult to understand, making it difficult to assess cognition. he also is tangential and contradictory. He needs repeated cues to allow therapist to speak rather than speaking over her often. Pt is anxious with all mobility, reporting fear of falling. he is only following simple multi-modal commands ~50% of the time. Unsure if this is baseline or not, but suspect this may be below his baseline  considering he was reportedly independent with w/c mobility and grooming Following commands: Impaired Following commands impaired: Follows one step commands inconsistently, Follows one step commands with increased time     Cueing Cueing Techniques: Verbal cues, Tactile cues     General Comments      Exercises     Assessment/Plan    PT Assessment All further PT needs can be met in the next venue of care  PT Problem List Decreased strength;Decreased activity tolerance;Decreased range of motion;Decreased balance;Decreased mobility;Decreased cognition;Decreased knowledge of precautions       PT Treatment Interventions      PT Goals (Current goals can be found in the Care Plan  section)  Acute Rehab PT Goals Patient Stated Goal: to go back to his SNF PT Goal Formulation: All assessment and education complete, DC therapy Time For Goal Achievement: 10/10/24 Potential to Achieve Goals: Good    Frequency       Co-evaluation               AM-PAC PT 6 Clicks Mobility  Outcome Measure Help needed turning from your back to your side while in a flat bed without using bedrails?: Total Help needed moving from lying on your back to sitting on the side of a flat bed without using bedrails?: Total Help needed moving to and from a bed to a chair (including a wheelchair)?: Total Help needed standing up from a chair using your arms (e.g., wheelchair or bedside chair)?: Total Help needed to walk in hospital room?: Total Help needed climbing 3-5 steps with a railing? : Total 6 Click Score: 6    End of Session   Activity Tolerance: Patient limited by fatigue;Other (comment) (limited by pt anxiety) Patient left: in bed;with call bell/phone within reach;with bed alarm set;Other (comment) (with transport arriving to take pt for test) Nurse Communication: Need for lift equipment (wrote on board in pt's room) PT Visit Diagnosis: Muscle weakness (generalized) (M62.81)    Time: 9092-9078 PT Time Calculation (min) (ACUTE ONLY): 14 min   Charges:   PT Evaluation $PT Eval Moderate Complexity: 1 Mod   PT General Charges $$ ACUTE PT VISIT: 1 Visit         Theo Ferretti, PT, DPT Acute Rehabilitation Services  Office: [phone]   Theo CHRISTELLA Ferretti 10/09/2024, 9:40 AM

## 2024-10-09 NOTE — Plan of Care (Signed)
 HAND SURGERY UPDATE NOTE:  Aware of patient admission under the medical service currently.  Okay for daily dressing changes for the left hand status post prior partial amputation of the ring finger.  Has not been able to follow-up in the outpatient setting yet.  Will plan to follow-up with patient on the inpatient side next week for possible suture removal if remains admitted.  Courtne Lighty, MD Hand Surgery, [place]

## 2024-10-09 NOTE — Progress Notes (Signed)
 " [facility] Progress Note   Subjective:   Seen in room, reports swallow study went well. Denies SOB and CP. Son feels he is confused, thought he was at [facility]. Does not want to answer orientation questions, says he is right here, right now.  Objective Vitals:   10/08/24 2339 10/09/24 0500 10/09/24 0800 10/09/24 0845  BP: 104/77 110/71 117/78   Pulse: 60 (!) 35 (!) 102 98  Resp: 20 (!) 25 20 18   Temp: 98.6 F (37 C) 98.4 F (36.9 C) 98.2 F (36.8 C)   TempSrc: Oral Oral    SpO2: 97% 99% 100% 100%  Weight:  105.7 kg    Height:       Physical Exam General: alert male in NAD Heart: RRR, no murmur Lungs: CTA bilaterally, respirations unlabored Abdomen: non-distended  Extremities: b/l BKAs, no edema  Dialysis Access: LUE AVF + t/b  Additional Objective Labs: Basic Metabolic Panel: Recent Labs  Lab 10/06/24 1455 10/07/24 0324 10/08/24 0229  NA 143 141 139  K 4.8 5.6* 4.3  CL 100 96* 96*  CO2 24 19* 23  GLUCOSE 158* 170* 124*  BUN 60* 70* 42*  CREATININE 7.57* 8.23* 6.14*  CALCIUM  8.1* 8.7* 8.0*  PHOS  --  5.4* 3.8   Liver Function Tests: Recent Labs  Lab 10/06/24 0809 10/07/24 0324 10/08/24 0229  AST 19  --   --   ALT 13  --   --   ALKPHOS 128*  --   --   BILITOT 0.7  --   --   PROT 5.9*  --   --   ALBUMIN  3.3* 3.3* 2.9*   No results for input(s): LIPASE, AMYLASE in the last 168 hours. CBC: Recent Labs  Lab 10/06/24 0809 10/06/24 0845 10/06/24 1323 10/07/24 0324 10/08/24 0229  WBC 9.5  --   --  9.5 8.0  NEUTROABS 7.4  --   --   --   --   HGB 10.1*   < > 11.2* 12.4* 10.0*  HCT 33.0*   < > 33.0* 39.2 32.6*  MCV 95.9  --   --  93.1 93.7  PLT 164  --   --  167 154   < > = values in this interval not displayed.   Blood Culture    Component Value Date/Time   SDES BLOOD RIGHT HAND 10/08/2024 1329   SDES BLOOD RIGHT HAND 10/08/2024 1329   SPECREQUEST  10/08/2024 1329    BOTTLES DRAWN AEROBIC AND ANAEROBIC Blood Culture results may  not be optimal due to an inadequate volume of blood received in culture bottles   SPECREQUEST AEROBIC BOTTLE ONLY Blood Culture adequate volume 10/08/2024 1329   CULT  10/08/2024 1329    NO GROWTH < 24 HOURS Performed at [facility], [address], [place], [place] [address]    CULT  10/08/2024 1329    NO GROWTH < 24 HOURS Performed at [facility], [address], [place], [place] [address]    REPTSTATUS PENDING 10/08/2024 1329   REPTSTATUS PENDING 10/08/2024 1329    Cardiac Enzymes: No results for input(s): CKTOTAL, CKMB, CKMBINDEX, TROPONINI in the last 168 hours. CBG: Recent Labs  Lab 10/07/24 1954 10/07/24 2339 10/08/24 0334 10/08/24 0736 10/08/24 1140  GLUCAP 164* 153* 133* 133* 121*   Iron Studies: No results for input(s): IRON, TIBC, TRANSFERRIN, FERRITIN in the last 72 hours. @lablastinr3 @ Studies/Results: No results found. Medications:  anticoagulant sodium citrate   meropenem  (MERREM ) IV 200 mL/hr at 10/08/24 1629   [START ON 10/10/2024] vancomycin       apixaban   5 mg Oral BID   atorvastatin   80 mg Oral Daily   Chlorhexidine  Gluconate Cloth  6 each Topical Daily   Chlorhexidine  Gluconate Cloth  6 each Topical Q0600   famotidine   10 mg Oral QODAY   feeding supplement  237 mL Oral BID BM   lactulose   10 g Oral BID   multivitamin  1 tablet Oral QHS   mupirocin  ointment   Nasal BID   nutrition supplement (JUVEN)  1 packet Oral BID BM    Dialysis Orders: [facility] [employer] [facility] [employer] on TTS 180NRe EDW 106kg BFR 450 DFR 800 4hr 2K 2.5Ca No heparin  Mircera 200mcg IV q 2 weeks- last 10/03/24 Venofer  50mg  weekly Hectorol  2mcg Iv q HD  Assessment/Plan:  AMS: Started on sepsis protocol, had recent admission for AMS and had pneumonia at that time. Work up per admitting team. Extubated 1/28.  ESRD:  On TTS Saturday, last HD was Saturday but shortened due to weather.  - Tolerated HD on 1/28 and 1/29, next HD tomorrow     Hypertension/volume: BP soft, received 1L LR in the ED. Does not appear volume overloaded on exam  Anemia: Hgb at goal, Not due for ESA  Metabolic bone disease: Calcium  controlled, follow phos. Resume binders once eating  Nutrition:  Currently NPO  Lucie Collet, PA-C 10/09/2024, 11:50 AM  [facility] Pager: [phone]   "

## 2024-10-09 NOTE — TOC Transition Note (Signed)
 Transition of Care [facility]) - Discharge Note   Patient Details  Name: [name] MRN: [id number] Date of Birth: 1956-07-08  [facility]) CM/SW Contact:  Montie LOISE Louder, LCSW Phone Number: 10/09/2024, 6:10 PM   Clinical Narrative:     CSW met with patient at bedside- CSW introduced self and explained role. Patient confirmed he is from [facility] and is expected to return once stable.    TOC will continue to follow and assist with discharge planning.  Montie Louder, MSW, LCSW Clinical Social Worker    Final next level of care: Skilled Nursing Facility Barriers to Discharge: Continued Medical Work up   Patient Goals and CMS Choice            Discharge Placement                       Discharge Plan and Services Additional resources added to the After Visit Summary for   In-house Referral: Clinical Social Work                                   Social Drivers of Health (SDOH) Interventions SDOH Screenings   Food Insecurity: No Food Insecurity (03/25/2024)  Housing: Low Risk (03/25/2024)  Transportation Needs: No Transportation Needs (03/25/2024)  Utilities: Not At Risk (03/25/2024)  Social Connections: Moderately Isolated (03/25/2024)  Tobacco Use: High Risk (10/08/2024)     Readmission Risk Interventions    08/12/2024    1:17 PM 05/29/2023    4:41 PM  Readmission Risk Prevention Plan  Transportation Screening Complete Complete  Medication Review [employer]) Complete Complete  PCP or Specialist appointment within 3-5 days of discharge  Complete  HRI or Home Care Consult Complete Complete  SW Recovery Care/Counseling Consult  Complete  Palliative Care Screening Not Applicable Not Applicable  Skilled Nursing Facility Not Applicable Complete

## 2024-10-10 LAB — BASIC METABOLIC PANEL WITH GFR
Anion gap: 17 — ABNORMAL HIGH (ref 5–15)
BUN: 43 mg/dL — ABNORMAL HIGH (ref 8–23)
CO2: 25 mmol/L (ref 22–32)
Calcium: 9 mg/dL (ref 8.9–10.3)
Chloride: 96 mmol/L — ABNORMAL LOW (ref 98–111)
Creatinine, Ser: 5.05 mg/dL — ABNORMAL HIGH (ref 0.61–1.24)
GFR, Estimated: 12 mL/min — ABNORMAL LOW
Glucose, Bld: 215 mg/dL — ABNORMAL HIGH (ref 70–99)
Potassium: 4.2 mmol/L (ref 3.5–5.1)
Sodium: 137 mmol/L (ref 135–145)

## 2024-10-10 LAB — CBC
HCT: 35.6 % — ABNORMAL LOW (ref 39.0–52.0)
Hemoglobin: 10.9 g/dL — ABNORMAL LOW (ref 13.0–17.0)
MCH: 28.4 pg (ref 26.0–34.0)
MCHC: 30.6 g/dL (ref 30.0–36.0)
MCV: 92.7 fL (ref 80.0–100.0)
Platelets: 187 10*3/uL (ref 150–400)
RBC: 3.84 MIL/uL — ABNORMAL LOW (ref 4.22–5.81)
RDW: 21.3 % — ABNORMAL HIGH (ref 11.5–15.5)
WBC: 8.8 10*3/uL (ref 4.0–10.5)
nRBC: 0.2 % (ref 0.0–0.2)

## 2024-10-11 LAB — CULTURE, BLOOD (ROUTINE X 2)
Culture: NO GROWTH
Special Requests: ADEQUATE

## 2024-10-11 NOTE — Progress Notes (Signed)
 " PROGRESS NOTE    [name]  FMW:[id number] DOB: 28-Sep-1955 DOA: 10/06/2024 PCP: Montey Lot, PA-C   Brief Narrative:   69 year old male with history of bilateral BKA, chronic A-fib on Eliquis , ESRD on dialysis on TTS schedule, insulin -dependent diabetes type 2, hypertension, moderate aortic stenosis, chronic pain syndrome, chronic right-sided systolic heart failure who presented from SNF being encephalopathic, decreased left upper extremity movement, left facial droop.  On presentation, he was lethargic, tachycardic, hypoxic on room air, had marked fever and was hypotensive.  Patient was intubated for airway protection.  Concern for sepsis, started on empiric antibiotics.  Found to have 2 fentanyl  patch on the back, concern for polypharmacy, given Narcan .  Neurology also consulted.  He was initially recently admitted and was discharged in early January and at that time he was treated for pneumonia, proteus  bacteremia and also underwent amputation of left ring finger by orthopedics.   He was admitted under PCCM service this time.  Neurology, nephrology consulted.Extubated on 1/28.  Patient transferred to TRH service on 1/30.  He is from [facility] skilled nursing facility.  Assessment & Plan:  Active Problems:   Acute encephalopathy   Malnutrition of moderate degree   Acute metabolic encephalopathy: Presented with concern for stroke also with decreased left upper extremity movement, left facial droop.  Neurology consulted.  MRI of the brain, MRA head and neck did not show acute findings, showed old infarcts. Polypharmacy is a strong possibility, finding of 2 fentanyl  patches .  He was also on gabapentin .  Given Narcan . Mentation improved with dialysis but not yet back to baseline.  Only on  oxycodone  for now.  Hold gabapentin  indefinitely.  Neurology signed off. Continue to monitor mentation.  His mentation is still not back to baseline.  Continue to monitor neurological status  closely.   Sepsis secondary to pneumonia/ESBL E.coli in sputum:   Blood culture sent here showed Staph epidermidis, suspected  contamination.  Currently on vancomycin , plan was for total  5 days of treatment  as per PCCM,given on dialysis. Respiratory culture showed ESBL E. coli, currently on meropenem .  Remains afebrile, no leukocytosis.  Repeat blood cultures sent on 1/29 showed no growth to date   Acute hypoxic respiratory failure/history of COPD/OSA: Intubated on arrival for airway protection.  Has been extubated on 1/28.  Currently on room air.  Not in COPD exacerbation.  History of tobacco use   ESRD on dialysis: Nephrology following for dialysis.  Has AV fistula on the left upper extremity.  Hemodialysis to be done today.  Patient's usual schedule is Tuesday Thursday and Saturday.  Tolerated hemodialysis on 1/28 and 1/29.  Next dialysis session will be done today.   Left hand fourth finger gangrene:  S/P  amputation on last admission.  Orthopedics recommended to continue dressing until the outpatient follow-up in 7-10 days.  Wound care was consulted.     Chronic A-fib, rate controlled: Monitor on telemetry.  On Eliquis . Takes Coreg  for rate control.     Type 2 diabetes mellitus: Continue current insulin  regimen.  Monitor blood sugars.  Recent A1c of 6.9.   Hyperlipidemia: On Lipitor    History of moderate aortic stenosis: Patient echo showed EF of 50 to 55%, moderate aortic stenosis.  We recommend to follow-up with cardiology as an outpatient   Anemia chronic disease: Currently hemoglobin stable   Debility/: Deconditioning: Patient residing at [facility].  Patient has bilateral BKA.  On statin for peripheral artery disease.  On dysphagia  2 diet   DVT prophylaxis:  apixaban  (ELIQUIS ) tablet 5 mg     Code Status: Full Code Family Communication: None at the bedside Status is: Inpatient Remains inpatient appropriate because: Acute encephalopathy, ESRD  patient    Subjective:  No acute events overnight.  No distress noted.  He appears slightly more awake and alert this morning.  Examination:  General exam: Appears likely more awake and alert this morning Respiratory system: Clear to auscultation. Respiratory effort normal. Cardiovascular system: S1 & S2 heard, RRR. No JVD, murmurs, rubs, gallops or clicks. No pedal edema. Gastrointestinal system: Abdomen is nondistended, soft and nontender. No organomegaly or masses felt. Normal bowel sounds heard. Central nervous system: Confused and drowsy. No focal neurological deficits. Extremities: Status post bilateral below-knee amputations Skin: No rashes, lesions or ulcers Psychiatry: Appear drowsy and slightly confused/disoriented  Wound 08/25/24 2047 Pressure Injury Sacrum Stage 2 -  Partial thickness loss of dermis presenting as a shallow open injury with a red, pink wound bed without slough. (Active)     Diet Orders (From admission, onward)     Start     Ordered   10/08/24 1138  DIET DYS 2 Room service appropriate? Yes; Fluid consistency: Thin  Diet effective now       Question Answer Comment  Room service appropriate? Yes   Fluid consistency: Thin      10/08/24 1138            Objective: Vitals:   10/11/24 0300 10/11/24 0400 10/11/24 0415 10/11/24 0800  BP: 121/87 120/72  130/80  Pulse: (!) 104 (!) 139 100 (!) 109  Resp: (!) 24 13 (!) 25 20  Temp: 98.6 F (37 C)   98.2 F (36.8 C)  TempSrc: Axillary   Oral  SpO2: 99% (!) 70% 100% 100%  Weight:      Height:        Intake/Output Summary (Last 24 hours) at 10/11/2024 1026 Last data filed at 10/11/2024 0850 Gross per 24 hour  Intake 817 ml  Output --  Net 817 ml   Filed Weights   10/09/24 0500 10/10/24 0629 10/11/24 0259  Weight: 105.7 kg 103.6 kg 104.2 kg    Scheduled Meds:  apixaban   5 mg Oral BID   atorvastatin   80 mg Oral Daily   Chlorhexidine  Gluconate Cloth  6 each Topical Daily   Chlorhexidine   Gluconate Cloth  6 each Topical Q0600   Chlorhexidine  Gluconate Cloth  6 each Topical Q0600   famotidine   10 mg Oral QODAY   feeding supplement  237 mL Oral BID BM   lactulose   10 g Oral BID   multivitamin  1 tablet Oral QHS   nutrition supplement (JUVEN)  1 packet Oral BID BM   Continuous Infusions:  anticoagulant sodium citrate      meropenem  (MERREM ) IV Stopped (10/10/24 2134)   vancomycin       Nutritional status Signs/Symptoms: moderate muscle depletion, mild fat depletion Interventions: Ensure Enlive (each supplement provides 350kcal and 20 grams of protein), MVI Body mass index is 29.49 kg/m.  Data Reviewed:   CBC: Recent Labs  Lab 10/06/24 0809 10/06/24 0845 10/06/24 1215 10/06/24 1323 10/07/24 0324 10/08/24 0229 10/10/24 0328  WBC 9.5  --   --   --  9.5 8.0 8.8  NEUTROABS 7.4  --   --   --   --   --   --   HGB 10.1*   < > 11.6* 11.2* 12.4* 10.0* 10.9*  HCT 33.0*   < >  34.0* 33.0* 39.2 32.6* 35.6*  MCV 95.9  --   --   --  93.1 93.7 92.7  PLT 164  --   --   --  167 154 187   < > = values in this interval not displayed.   Basic Metabolic Panel: Recent Labs  Lab 10/06/24 0809 10/06/24 0845 10/06/24 1215 10/06/24 1323 10/06/24 1455 10/07/24 0324 10/08/24 0229 10/10/24 0328  NA 141 141   < > 140 143 141 139 137  K 4.9 4.6   < > 4.6 4.8 5.6* 4.3 4.2  CL 99 100  --   --  100 96* 96* 96*  CO2 25  --   --   --  24 19* 23 25  GLUCOSE 167* 160*  --   --  158* 170* 124* 215*  BUN 59* 54*  --   --  60* 70* 42* 43*  CREATININE 8.04* 8.40*  --   --  7.57* 8.23* 6.14* 5.05*  CALCIUM  8.7*  --   --   --  8.1* 8.7* 8.0* 9.0  MG  --   --   --   --  2.2  --   --   --   PHOS  --   --   --   --   --  5.4* 3.8  --    < > = values in this interval not displayed.   GFR: Estimated Creatinine Clearance: 17.5 mL/min (A) (by C-G formula based on SCr of 5.05 mg/dL (H)). Liver Function Tests: Recent Labs  Lab 10/06/24 0809 10/07/24 0324 10/08/24 0229  AST 19  --   --    ALT 13  --   --   ALKPHOS 128*  --   --   BILITOT 0.7  --   --   PROT 5.9*  --   --   ALBUMIN  3.3* 3.3* 2.9*   No results for input(s): LIPASE, AMYLASE in the last 168 hours. Recent Labs  Lab 10/06/24 1053 10/08/24 1134  AMMONIA 63* 17   Coagulation Profile: Recent Labs  Lab 10/06/24 0809  INR 2.9*   Cardiac Enzymes: No results for input(s): CKTOTAL, CKMB, CKMBINDEX, TROPONINI in the last 168 hours. BNP (last 3 results) Recent Labs    09/19/24 0336 09/19/24 2040 10/06/24 0809  PROBNP >35,000.0* >35,000.0* >35,000.0*   HbA1C: No results for input(s): HGBA1C in the last 72 hours. CBG: Recent Labs  Lab 10/07/24 1954 10/07/24 2339 10/08/24 0334 10/08/24 0736 10/08/24 1140  GLUCAP 164* 153* 133* 133* 121*   Lipid Profile: No results for input(s): CHOL, HDL, LDLCALC, TRIG, CHOLHDL, LDLDIRECT in the last 72 hours. Thyroid  Function Tests: No results for input(s): TSH, T4TOTAL, FREET4, T3FREE, THYROIDAB in the last 72 hours. Anemia Panel: No results for input(s): VITAMINB12, FOLATE, FERRITIN, TIBC, IRON, RETICCTPCT in the last 72 hours. Sepsis Labs: Recent Labs  Lab 10/06/24 0846 10/06/24 1059 10/06/24 1217  LATICACIDVEN 2.2* 0.8 1.2    Recent Results (from the past 240 hours)  Blood Culture (routine x 2)     Status: Abnormal   Collection Time: 10/06/24  8:05 AM   Specimen: BLOOD RIGHT FOREARM  Result Value Ref Range Status   Specimen Description BLOOD RIGHT FOREARM  Final   Special Requests   Final    BOTTLES DRAWN AEROBIC AND ANAEROBIC Blood Culture adequate volume   Culture  Setup Time   Final    GRAM POSITIVE COCCI IN CLUSTERS IN BOTH AEROBIC AND ANAEROBIC BOTTLES CRITICAL RESULT  CALLED TO, READ BACK BY AND VERIFIED WITH: PHARMD B WANNARAT [id number] AT 916 AM BY CM    Culture (A)  Final    STAPHYLOCOCCUS EPIDERMIDIS THE SIGNIFICANCE OF ISOLATING THIS ORGANISM FROM A SINGLE SET OF BLOOD CULTURES WHEN  MULTIPLE SETS ARE DRAWN IS UNCERTAIN. PLEASE NOTIFY THE MICROBIOLOGY DEPARTMENT WITHIN ONE WEEK IF SPECIATION AND SENSITIVITIES ARE REQUIRED. Performed at [facility], [address] [place], [place] [address]    Report Status 10/09/2024 FINAL  Final  Blood Culture ID Panel (Reflexed)     Status: Abnormal   Collection Time: 10/06/24  8:05 AM  Result Value Ref Range Status   Enterococcus faecalis NOT DETECTED NOT DETECTED Final   Enterococcus Faecium NOT DETECTED NOT DETECTED Final   Listeria monocytogenes NOT DETECTED NOT DETECTED Final   Staphylococcus species DETECTED (A) NOT DETECTED Final    Comment: CRITICAL RESULT CALLED TO, READ BACK BY AND VERIFIED WITH: PHARMD B WANNRAT [id number] AT 915 BY CM    Staphylococcus aureus (BCID) NOT DETECTED NOT DETECTED Final   Staphylococcus epidermidis DETECTED (A) NOT DETECTED Final    Comment: Methicillin (oxacillin) resistant coagulase negative staphylococcus. Possible blood culture contaminant (unless isolated from more than one blood culture draw or clinical case suggests pathogenicity). No antibiotic treatment is indicated for blood  culture contaminants. CRITICAL RESULT CALLED TO, READ BACK BY AND VERIFIED WITH: PHARMD B WANNATAR [id number] AT 915 BY CM    Staphylococcus lugdunensis NOT DETECTED NOT DETECTED Final   Streptococcus species NOT DETECTED NOT DETECTED Final   Streptococcus agalactiae NOT DETECTED NOT DETECTED Final   Streptococcus pneumoniae NOT DETECTED NOT DETECTED Final   Streptococcus pyogenes NOT DETECTED NOT DETECTED Final   A.calcoaceticus-baumannii NOT DETECTED NOT DETECTED Final   Bacteroides fragilis NOT DETECTED NOT DETECTED Final   Enterobacterales NOT DETECTED NOT DETECTED Final   Enterobacter cloacae complex NOT DETECTED NOT DETECTED Final   Escherichia coli NOT DETECTED NOT DETECTED Final   Klebsiella aerogenes NOT DETECTED NOT DETECTED Final   Klebsiella oxytoca NOT DETECTED NOT DETECTED Final   Klebsiella  pneumoniae NOT DETECTED NOT DETECTED Final   Proteus species NOT DETECTED NOT DETECTED Final   Salmonella species NOT DETECTED NOT DETECTED Final   Serratia marcescens NOT DETECTED NOT DETECTED Final   Haemophilus influenzae NOT DETECTED NOT DETECTED Final   Neisseria meningitidis NOT DETECTED NOT DETECTED Final   Pseudomonas aeruginosa NOT DETECTED NOT DETECTED Final   Stenotrophomonas maltophilia NOT DETECTED NOT DETECTED Final   Candida albicans NOT DETECTED NOT DETECTED Final   Candida auris NOT DETECTED NOT DETECTED Final   Candida glabrata NOT DETECTED NOT DETECTED Final   Candida krusei NOT DETECTED NOT DETECTED Final   Candida parapsilosis NOT DETECTED NOT DETECTED Final   Candida tropicalis NOT DETECTED NOT DETECTED Final   Cryptococcus neoformans/gattii NOT DETECTED NOT DETECTED Final   Methicillin resistance mecA/C DETECTED (A) NOT DETECTED Final    Comment: CRITICAL RESULT CALLED TO, READ BACK BY AND VERIFIED WITH: PHARMD B MIGDALIA [id number] AT 915 BY CM Performed at [facility], [address] [place], [place] [address]   Resp panel by RT-PCR (RSV, Flu A&B, Covid) Anterior Nasal Swab     Status: None   Collection Time: 10/06/24  9:31 AM   Specimen: Anterior Nasal Swab  Result Value Ref Range Status   SARS Coronavirus 2 by RT PCR NEGATIVE NEGATIVE Final   Influenza A by PCR NEGATIVE NEGATIVE Final   Influenza B by PCR  NEGATIVE NEGATIVE Final    Comment: (NOTE) The Xpert Xpress SARS-CoV-2/FLU/RSV plus assay is intended as an aid in the diagnosis of influenza from Nasopharyngeal swab specimens and should not be used as a sole basis for treatment. Nasal washings and aspirates are unacceptable for Xpert Xpress SARS-CoV-2/FLU/RSV testing.  Fact Sheet for Patients: bloggercourse.com  Fact Sheet for Healthcare Providers: seriousbroker.it  This test is not yet approved or cleared by the [place]  FDA and has been  authorized for detection and/or diagnosis of SARS-CoV-2 by FDA under an Emergency Use Authorization (EUA). This EUA will remain in effect (meaning this test can be used) for the duration of the COVID-19 declaration under Section 564(b)(1) of the Act, 21 U.S.C. section 360bbb-3(b)(1), unless the authorization is terminated or revoked.     Resp Syncytial Virus by PCR NEGATIVE NEGATIVE Final    Comment: (NOTE) Fact Sheet for Patients: bloggercourse.com  Fact Sheet for Healthcare Providers: seriousbroker.it  This test is not yet approved or cleared by the [place]  FDA and has been authorized for detection and/or diagnosis of SARS-CoV-2 by FDA under an Emergency Use Authorization (EUA). This EUA will remain in effect (meaning this test can be used) for the duration of the COVID-19 declaration under Section 564(b)(1) of the Act, 21 U.S.C. section 360bbb-3(b)(1), unless the authorization is terminated or revoked.  Performed at [facility], [address] [place], [place] [address]   Culture, Respiratory w Gram Stain     Status: None   Collection Time: 10/06/24 12:36 PM   Specimen: Tracheal Aspirate; Respiratory  Result Value Ref Range Status   Specimen Description TRACHEAL ASPIRATE  Final   Special Requests NONE  Final   Gram Stain   Final    FEW WBC PRESENT, PREDOMINANTLY PMN FEW GRAM POSITIVE COCCI Performed at [facility], [address] [place], [place] [address]    Culture   Final    FEW ESCHERICHIA COLI Confirmed Extended Spectrum Beta-Lactamase Producer (ESBL).  In bloodstream infections from ESBL organisms, carbapenems are preferred over piperacillin /tazobactam. They are shown to have a lower risk of mortality.    Report Status 10/08/2024 FINAL  Final   Organism ID, Bacteria ESCHERICHIA COLI  Final      Susceptibility   Escherichia coli - MIC*    AMPICILLIN  >=32 RESISTANT Resistant     CEFAZOLIN   (NON-URINE) >=32 RESISTANT Resistant     CEFEPIME  >=32 RESISTANT Resistant     ERTAPENEM <=0.12 SENSITIVE Sensitive     CEFTRIAXONE  >=64 RESISTANT Resistant     CIPROFLOXACIN  >=4 RESISTANT Resistant     GENTAMICIN  <=1 SENSITIVE Sensitive     MEROPENEM  <=0.25 SENSITIVE Sensitive     TRIMETH /SULFA  >=320 RESISTANT Resistant     AMPICILLIN /SULBACTAM >=32 RESISTANT Resistant     PIP/TAZO Value in next row Sensitive      8 SENSITIVEThis is a modified FDA-approved test that has been validated and its performance characteristics determined by the reporting laboratory.  This laboratory is certified under the Clinical Laboratory Improvement Amendments CLIA as qualified to perform high complexity clinical laboratory testing.    * FEW ESCHERICHIA COLI  MRSA Next Gen by PCR, Nasal     Status: Abnormal   Collection Time: 10/06/24  1:00 PM  Result Value Ref Range Status   MRSA by PCR Next Gen DETECTED (A) NOT DETECTED Final    Comment: RESULT CALLED TO, READ BACK BY AND VERIFIED WITH: RN Gina on [phone] @1830  by SM (NOTE) The GeneXpert MRSA  Assay (FDA approved for NASAL specimens only), is one component of a comprehensive MRSA colonization surveillance program. It is not intended to diagnose MRSA infection nor to guide or monitor treatment for MRSA infections. Test performance is not FDA approved in patients less than 20 years old. Performed at [facility], [address] [place], [place] [address]   Blood Culture (routine x 2)     Status: None   Collection Time: 10/06/24  7:52 PM   Specimen: BLOOD LEFT HAND  Result Value Ref Range Status   Specimen Description BLOOD LEFT HAND  Final   Special Requests   Final    BOTTLES DRAWN AEROBIC AND ANAEROBIC Blood Culture adequate volume   Culture   Final    NO GROWTH 5 DAYS Performed at [facility], [address] [place], [place] [address]    Report Status 10/11/2024 FINAL  Final  Culture, blood (Routine X 2) w Reflex to ID Panel      Status: None (Preliminary result)   Collection Time: 10/08/24  1:29 PM   Specimen: BLOOD RIGHT HAND  Result Value Ref Range Status   Specimen Description BLOOD RIGHT HAND  Final   Special Requests   Final    BOTTLES DRAWN AEROBIC AND ANAEROBIC Blood Culture results may not be optimal due to an inadequate volume of blood received in culture bottles   Culture   Final    NO GROWTH 3 DAYS Performed at [facility], [address] [place], [place] [address]    Report Status PENDING  Incomplete  Culture, blood (Routine X 2) w Reflex to ID Panel     Status: None (Preliminary result)   Collection Time: 10/08/24  1:29 PM   Specimen: BLOOD RIGHT HAND  Result Value Ref Range Status   Specimen Description BLOOD RIGHT HAND  Final   Special Requests AEROBIC BOTTLE ONLY Blood Culture adequate volume  Final   Culture   Final    NO GROWTH 3 DAYS Performed at [facility], [address], [place], [place] [address]    Report Status PENDING  Incomplete         Radiology Studies: DG Swallowing Func-Speech Pathology Result Date: 10/09/2024 Table formatting from the original result was not included. Modified Barium Swallow Study Patient Details Name: [name] MRN: [id number] Date of Birth: 1956-04-23 Today's Date: 10/09/2024 HPI/PMH: HPI: 30 yr M with history of tobacco use, A-fib on Eliquis  DM, COPD, severe PAD s/p B/L BKA, ESRD on HD, presents from SNF for AMS and hypoxia, unintentional opiate overdose, had 2 fentanyl  patches on him on admission, Neurontin  toxicity. Head CT without acute event, remote infarcts, LLL PNA. Intubated for airway protection 1/27, extubated 1/28. Of note, 3 recent hospitalization since 12/2 for volume overload, unintentional  opiate overdose, and recent 09/28/24  for metabolic encephalopathy, sepsis secondary to PNA with proteus bacteremia, and underwent amputation of left ring finger for gangrene on 1/15 discharged to SNF. Clinical Impression: Clinical Impression:  Pt seen for MBSS. Pt presents with a moderate oral dysphagia and grossly functional pharyngeal swallow c/b prolonged/inefficient mastication and A-P transit as well as trace-mild oral and pharyngeal residual. Deficits most pronouced with solids. Residual reduced with liquid wash. Suspect oral deficits exacerbated by mental status (pt talking incessantly, difficult to redirect) and edentulism. Pt's body habitus prohibitive of esophageal screening despite respositioning efforts. Recommend continuation of a Dysphagia 2 Diet with Thin Liquids with safe swallowing strategies/aspiration precautions as outlined  below. ST to sign off at this level of care. Recommend skilled ST services at next level of care for dysphagia management and possible diet upgrade. Factors that may increase risk of adverse event in presence of aspiration Noe & Lianne 2021): Factors that may increase risk of adverse event in presence of aspiration Noe & Lianne 2021): Poor general health and/or compromised immunity; Limited mobility; Dependence for feeding and/or oral hygiene (AMS) Recommendations/Plan: Swallowing Evaluation Recommendations Swallowing Evaluation Recommendations Recommendations: PO diet PO Diet Recommendation: Dysphagia 2 (Finely chopped); Thin liquids (Level 0) Liquid Administration via: Spoon; Cup; Straw Medication Administration: Crushed with puree (vs whole wth puree) Supervision: Full assist for feeding; Full supervision/cueing for swallowing strategies Swallowing strategies  : Minimize environmental distractions; Follow solids with liquids Postural changes: Position pt fully upright for meals Oral care recommendations: Oral care BID (2x/day); Staff/trained caregiver to provide oral care Treatment Plan Treatment Plan Treatment recommendations: No treatment recommended at this time (diet advancement and dysphagia management at next level of care) Follow-up recommendations: Skilled nursing-short term rehab (<3 hours/day)  Recommendations Recommendations for follow up therapy are one component of a multi-disciplinary discharge planning process, led by the attending physician.  Recommendations may be updated based on patient status, additional functional criteria and insurance authorization. Assessment: Orofacial Exam: Orofacial Exam Oral Cavity: Oral Hygiene: WFL Oral Cavity - Dentition: Dentures, not available Orofacial Anatomy: WFL Oral Motor/Sensory Function: Unable to test Anatomy: Anatomy: Presence of cervical hardware Boluses Administered: Boluses Administered Boluses Administered: Thin liquids (Level 0); Puree; Solid  Oral Impairment Domain: Oral Impairment Domain Lip Closure: Interlabial escape, no progression to anterior lip Tongue control during bolus hold: Not tested Bolus preparation/mastication: Slow prolonged chewing/mashing with complete recollection Bolus transport/lingual motion: Repetitive/disorganized tongue motion Oral residue: Trace residue lining oral structures Initiation of pharyngeal swallow : Pyriform sinuses  Pharyngeal Impairment Domain: Pharyngeal Impairment Domain Soft palate elevation: No bolus between soft palate (SP)/pharyngeal wall (PW) Laryngeal elevation: Complete superior movement of thyroid  cartilage with complete approximation of arytenoids to epiglottic petiole Anterior hyoid excursion: Complete anterior movement Epiglottic movement: Complete inversion Laryngeal vestibule closure: Complete, no air/contrast in laryngeal vestibule Pharyngeal stripping wave : Present - diminished Pharyngeal contraction (A/P view only): N/A Pharyngoesophageal segment opening: Complete distension and complete duration, no obstruction of flow Tongue base retraction: Trace column of contrast or air between tongue base and PPW Pharyngeal residue: Trace residue within or on pharyngeal structures; Collection of residue within or on pharyngeal structures Location of pharyngeal residue: Tongue base; Valleculae; Pharyngeal  wall  Esophageal Impairment Domain: Esophageal Impairment Domain Esophageal clearance upright position: -- (unable to fully visualize due to body habitus) Pill: Pill Consistency administered: -- (DNT) Penetration/Aspiration Scale Score: Penetration/Aspiration Scale Score 1.  Material does not enter airway: Thin liquids (Level 0); Puree; Solid Compensatory Strategies: Compensatory Strategies Compensatory strategies: Yes Liquid wash: Effective Effective Liquid Wash: Solid   General Information: Caregiver present: No  Diet Prior to this Study: Dysphagia 2 (finely chopped); Thin liquids (Level 0)   Temperature : Normal   Respiratory Status: WFL   No data recorded  History of Recent Intubation: Yes  Behavior/Cognition: Distractible; Requires cueing; Pleasant mood Self-Feeding Abilities: Needs assist with self-feeding (in context of MBSS) Baseline vocal quality/speech: Hypophonia/low volume Volitional Cough: Unable to elicit Volitional Swallow: Able to elicit Exam Limitations: Poor positioning Goal Planning: Prognosis for improved oropharyngeal function: Fair Barriers to Reach Goals: Cognitive deficits; Overall medical prognosis No data recorded No data recorded Consulted and agree with results and recommendations: Patient; Nurse  Pain: Pain Assessment Pain Assessment: Faces Faces Pain Scale: 4 Pain Location: generalized with mobility Pain Descriptors / Indicators: Discomfort; Grimacing; Moaning Pain Intervention(s): Limited activity within patient's tolerance; Monitored during session; Repositioned End of Session: Start Time:SLP Start Time (ACUTE ONLY): 0933 Stop Time: SLP Stop Time (ACUTE ONLY): 0945 Time Calculation:SLP Time Calculation (min) (ACUTE ONLY): 12 min Charges: SLP Evaluations $ SLP Speech Visit: 1 Visit SLP Evaluations $BSS Swallow: 1 Procedure $MBS Swallow: 1 Procedure SLP visit diagnosis: SLP Visit Diagnosis: Dysphagia, oral phase (R13.11) Past Medical History: Past Medical History: Diagnosis Date  Carotid  artery occlusion 11/10/2010  LEFT CAROTID ENDARTERECTOMY  Complication of anesthesia   BP WENT UP AT [facility]   COPD (chronic obstructive pulmonary disease) (HCC)   pt denies this dx as of 06/01/20 - no inhaler   Diabetes mellitus without complication (HCC)   Diverticulitis   Diverticulosis of colon (without mention of hemorrhage)   DJD (degenerative joint disease)   knees/hands/feet/back/neck  ESRD (end stage renal disease) on dialysis (HCC)   Fatty liver   Full dentures   GERD (gastroesophageal reflux disease)   H/O hiatal hernia   History of blood transfusion   with a past surical procedure per patient 06/01/20  History of right below knee amputation (HCC) 07/12/2016  Hyperlipidemia   Hypertension   Neuromuscular disorder (HCC)   peripheral neuropathy  Non-pressure chronic ulcer of other part of left foot limited to breakdown of skin (HCC) 11/12/2016  Nonrheumatic aortic (valve) stenosis 03/18/2024  Limited TTE 03/18/2024: EF 55-60, moderate LVH, normal RVSF, moderate LAE, mild MR, mild-moderate AS (mean 12, V-max 235 cm/s, DI 0.35), aortic root 39 mm, moderate pleural effusion, trivial pericardial effusion   Osteomyelitis (HCC)   left 5th metatarsal  PAD (peripheral artery disease)   Distal aortogram June 2012. Atherectomy left popliteal artery July 2012.   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  Unstable angina (HCC) 09/16/2018  Wears glasses  Past Surgical History: Past Surgical History: Procedure Laterality Date  ABDOMINAL AORTOGRAM W/LOWER EXTREMITY N/A 06/23/2021  Procedure: ABDOMINAL AORTOGRAM W/LOWER EXTREMITY;  Surgeon: Elmira Newman PARAS, MD;  [facility];  Service: Cardiovascular;  Laterality: N/A;  AMPUTATION  11/05/2011  Procedure: AMPUTATION RAY;  Surgeon: Norleen Armor,  MD;  Location: [facility] OR;  Service: Orthopedics;  Laterality: Right;  Amputation of Right 4&5th Toes  AMPUTATION Left 11/26/2012  Procedure: AMPUTATION RAY;  Surgeon: Norleen Armor, MD;  Location: [facility] OR;  Service: Orthopedics;  Laterality: Left;  fourth ray amputation  AMPUTATION Right 08/27/2014  Procedure: Transmetatarsal Amputation;  Surgeon: Jerona Harden GAILS, MD;  Location: [facility] OR;  Service: Orthopedics;  Laterality: Right;  AMPUTATION Right 01/14/2015  Procedure: AMPUTATION BELOW KNEE;  Surgeon: Jerona Harden GAILS, MD;  Location: [facility];  Service: Orthopedics;  Laterality: Right;  AMPUTATION Left 10/21/2015  Procedure: Left Foot 5th Ray Amputation;  Surgeon: Jerona Harden GAILS, MD;  Location: [facility] OR;  Service: Orthopedics;  Laterality: Left;  AMPUTATION Left 07/04/2022  Procedure: LEFT BELOW KNEE AMPUTATION;  Surgeon: Harden Jerona GAILS, MD;  Location: [facility] OR;  Service: Orthopedics;  Laterality: Left;  AMPUTATION FINGER Left 09/23/2024  Procedure: AMPUTATION, FINGER;  Surgeon: Arlinda Buster, MD;  Location: [facility];  Service: Orthopedics;  Laterality: Left;  LEFT RING FINGER AMPUTATION  ANTERIOR FUSION CERVICAL SPINE  02/06/2006  C4-5, C5-6, C6-7; SURGEON DR. MAX COHEN  AV FISTULA PLACEMENT Left 06/02/2020  Procedure: ARTERIOVENOUS (AV) FISTULA CREATION LEFT;  Surgeon: Sheree Penne Bruckner, MD;  Location: [facility] OR;  Service: Vascular;  Laterality: Left;  BACK SURGERY    x 3  BASCILIC VEIN TRANSPOSITION Left 07/21/2020  Procedure: LEFT UPPER ARM ATERIOVENOUS SUPERFISTULALIZATION;  Surgeon: Sheree Penne Bruckner, MD;  Location: [facility] OR;  Service: Vascular;  Laterality: Left;  BELOW KNEE LEG AMPUTATION Right   CARDIAC CATHETERIZATION  10/31/2004  2009  CAROTID ENDARTERECTOMY  11/10/2010  CAROTID ENDARTERECTOMY Left 11/10/2010  Subtotal occlusion of left internal carotid artery with left hemispheric transient ischemic attacks.  CAROTID STENT    CARPAL TUNNEL RELEASE Right 10/21/2013  Procedure: RIGHT CARPAL TUNNEL RELEASE;  Surgeon: Arley JONELLE Curia, MD;  Location: [facility];  Service: Orthopedics;  Laterality: Right;  CHOLECYSTECTOMY    COLON SURGERY    COLONOSCOPY    COLOSTOMY REVERSAL  05/21/2018  ileostomy reversal  CORONARY LITHOTRIPSY N/A 11/24/2023  Procedure: CORONARY LITHOTRIPSY;  Surgeon: Jordan, Peter M, MD;  Location: [facility] INVASIVE CV LAB;  Service: Cardiovascular;  Laterality: N/A;  CORONARY STENT INTERVENTION N/A 11/24/2023  Procedure: CORONARY STENT INTERVENTION;  Surgeon: Jordan, Peter M, MD;  Location: [facility] INVASIVE CV LAB;  Service: Cardiovascular;  Laterality: N/A;  CYSTOSCOPY WITH STENT PLACEMENT Bilateral 01/13/2018  Procedure: CYSTOSCOPY WITH BILATERAL URETERAL CATHETER PLACEMENT;  Surgeon: Cam Morene ORN, MD;  Location: [facility] ORS;  Service: Urology;  Laterality: Bilateral;  ESOPHAGEAL MANOMETRY Bilateral 07/19/2014  Procedure: ESOPHAGEAL MANOMETRY (EM);  Surgeon: Gordy CHRISTELLA Starch, MD;  Location: [facility];  Service: Gastroenterology;  Laterality: Bilateral;  EYE SURGERY Bilateral 2020  cataract  FEMORAL ARTERY STENT    x6  FINGER SURGERY    FOOT SURGERY  04/25/2016   EXCISION BASE 5TH METATARSAL AND PARTIAL CUBOID LEFT FOOT  HERNIA REPAIR    LEFT INGUINAL AND UMBILICAL REPAIRS  HERNIA REPAIR    I & D EXTREMITY Left 04/25/2016  Procedure: EXCISION BASE 5TH METATARSAL AND PARTIAL CUBOID LEFT FOOT;  Surgeon: Jerona LULLA Sage, MD;  Location: [facility];  Service: Orthopedics;  Laterality: Left;  ILEOSTOMY  01/13/2018  Procedure: ILEOSTOMY;  Surgeon: Signe Mitzie LABOR, MD;  Location: [facility];  Service: General;;  ILEOSTOMY CLOSURE N/A 05/21/2018  Procedure: ILEOSTOMY REVERSAL ERAS PATHWAY;  Surgeon: Signe Mitzie LABOR, MD;  Location: [facility];  Service: General;  Laterality: N/A;  IR RADIOLOGIST EVAL & MGMT  11/19/2017  IR RADIOLOGIST EVAL & MGMT  12/03/2017  IR RADIOLOGIST EVAL & MGMT  12/18/2017  JOINT REPLACEMENT Right 2001  Total knee  LAMINECTOMY    X 3 LUMBAR AND X 2 CERVICAL SPINE OPERATIONS  LAPAROSCOPIC CHOLECYSTECTOMY W/  CHOLANGIOGRAPHY  11/09/2004  SURGEON DR. DEWARD CANDIE NULL  LEFT HEART CATH AND CORONARY ANGIOGRAPHY N/A 09/16/2018  Procedure: LEFT HEART CATH AND CORONARY ANGIOGRAPHY;  Surgeon: Elmira Newman PARAS, MD;  [facility];  Service: Cardiovascular;  Laterality: N/A;  LEFT HEART CATH AND CORONARY ANGIOGRAPHY N/A 11/24/2023  Procedure: LEFT HEART CATH AND CORONARY ANGIOGRAPHY;  Surgeon: Jordan, Peter M, MD;  Location: [facility] INVASIVE CV LAB;  Service: Cardiovascular;  Laterality: N/A;  LEFT HEART CATHETERIZATION WITH CORONARY ANGIOGRAM N/A 10/29/2014  Procedure: LEFT HEART CATHETERIZATION WITH CORONARY ANGIOGRAM;  Surgeon: Erick JONELLE Bergamo, MD;  Location: [facility] CATH LAB;  Service: Cardiovascular;  Laterality: N/A;  LIGATION OF COMPETING BRANCHES OF ARTERIOVENOUS FISTULA Left 07/21/2020  Procedure: LIGATION OF COMPETING  BRANCHES OF LEFT UPPER ARM ARTERIOVENOUS FISTULA;  Surgeon: Sheree Penne Bruckner, MD;  Location: [facility] OR;  Service: Vascular;  Laterality: Left;  LOWER EXTREMITY ANGIOGRAM N/A 03/19/2012  Procedure: LOWER EXTREMITY ANGIOGRAM;  Surgeon: Bruckner JONETTA Cash, MD;  Location: [facility] CATH LAB;  Service: Cardiovascular;  Laterality: N/A;  LOWER EXTREMITY ANGIOGRAPHY N/A 06/20/2021  Procedure: LOWER EXTREMITY ANGIOGRAPHY;  Surgeon: Elmira Newman PARAS, MD;  [facility];  Service: Cardiovascular;  Laterality: N/A;  NECK SURGERY    PARTIAL COLECTOMY N/A 01/13/2018  Procedure: LAPAROSCOPIC ASSISTED   SIGMOID COLECTOMY ILEOSTOMY;  Surgeon: Signe Mitzie LABOR, MD;  Location: [facility];  Service: General;  Laterality: N/A;  PENILE PROSTHESIS IMPLANT  08/14/2005  INFRAPUBIC INSERTION OF INFLATABLE PENILE PROSTHESIS; SURGEON DR. JANIT  PENILE PROSTHESIS IMPLANT    PERCUTANEOUS CORONARY STENT INTERVENTION (PCI-S) Right 10/29/2014  Procedure: PERCUTANEOUS CORONARY STENT INTERVENTION (PCI-S);  Surgeon: Erick JONELLE Bergamo, MD;  Location: [facility] CATH LAB;  Service: Cardiovascular;  Laterality: Right;   PERICARDIOCENTESIS N/A 02/13/2024  Procedure: PERICARDIOCENTESIS;  Surgeon: Elmira Newman PARAS, MD;  [facility];  Service: Cardiovascular;  Laterality: N/A;  PERIPHERAL VASCULAR INTERVENTION Left 06/23/2021  Procedure: PERIPHERAL VASCULAR INTERVENTION;  Surgeon: Elmira Newman PARAS, MD;  [facility];  Service: Cardiovascular;  Laterality: Left;  REMOVAL OF PENILE PROSTHESIS N/A 06/14/2021  Procedure: Removal of THREE piece inflatable penile prosthesis;  Surgeon: Carolee Sherwood JONETTA DOUGLAS, MD;  Location: [facility] OR;  Service: Urology;  Laterality: N/A;  SHOULDER ARTHROSCOPY    SPINE SURGERY    TEMPORARY PACEMAKER N/A 02/13/2024  Procedure: TEMPORARY PACEMAKER;  Surgeon: Elmira Newman PARAS, MD;  [facility];  Service: Cardiovascular;  Laterality: N/A;  TOE AMPUTATION Left   TONSILLECTOMY    TOTAL KNEE ARTHROPLASTY  07/2002  RIGHT KNEE ; SURGEON  DR. [name] ALSO HAD ARTHROSCOPIC RIGHT KNEE IN  10/2001  TOTAL KNEE ARTHROPLASTY    ULNAR NERVE TRANSPOSITION Right 10/21/2013  Procedure: RIGHT ELBOW  ULNAR NERVE DECOMPRESSION;  Surgeon: Arley JONELLE Curia, MD;  Location: [facility];  Service: Orthopedics;  Laterality: Right; Entered on behalf of Delon Bangs, CCC-SLP Damien Blumenthal, M.A., CCC-SLP Speech Language Pathology, Acute Rehabilitation Services Secure Chat preferred [phone] 10/09/2024, 1:50 PM          LOS: 5 days   Time spent= 35 mins    Deliliah Room, MD [facility]  If 7PM-7AM, please contact night-coverage  10/11/2024, 10:26 AM  "

## 2024-10-11 NOTE — TOC Progression Note (Signed)
 Transition of Care [facility]) - Progression Note    Patient Details  Name: [name] MRN: [id number] Date of Birth: 12-Jun-1956  Transition of Care [facility]) CM/SW Contact  Montie LOISE Louder, [place] Phone Number: 10/11/2024, 12:21 PM  Clinical Narrative:     TOC continues to follow   Expected Discharge Plan: Skilled Nursing Facility Barriers to Discharge: Continued Medical Work up               Expected Discharge Plan and Services In-house Referral: Clinical Social Work                                             Social Drivers of Health (SDOH) Interventions SDOH Screenings   Food Insecurity: No Food Insecurity (03/25/2024)  Housing: Low Risk (03/25/2024)  Transportation Needs: No Transportation Needs (03/25/2024)  Utilities: Not At Risk (03/25/2024)  Social Connections: Moderately Isolated (03/25/2024)  Tobacco Use: High Risk (10/08/2024)    Readmission Risk Interventions    08/12/2024    1:17 PM 05/29/2023    4:41 PM  Readmission Risk Prevention Plan  Transportation Screening Complete Complete  Medication Review Oceanographer) Complete Complete  PCP or Specialist appointment within 3-5 days of discharge  Complete  HRI or Home Care Consult Complete Complete  SW Recovery Care/Counseling Consult  Complete  Palliative Care Screening Not Applicable Not Applicable  Skilled Nursing Facility Not Applicable Complete

## 2024-10-11 NOTE — Plan of Care (Signed)
" °  Problem: Nutritional: Goal: Maintenance of adequate nutrition will improve Outcome: Progressing   Problem: Respiratory: Goal: Ability to maintain a clear airway and adequate ventilation will improve Outcome: Progressing   Problem: Coping: Goal: Level of anxiety will decrease Outcome: Progressing   "

## 2024-10-12 DIAGNOSIS — G9341 Metabolic encephalopathy: Secondary | ICD-10-CM | POA: Diagnosis not present

## 2024-10-12 LAB — CBC
HCT: 39.6 % (ref 39.0–52.0)
Hemoglobin: 12 g/dL — ABNORMAL LOW (ref 13.0–17.0)
MCH: 28.7 pg (ref 26.0–34.0)
MCHC: 30.3 g/dL (ref 30.0–36.0)
MCV: 94.7 fL (ref 80.0–100.0)
Platelets: 207 10*3/uL (ref 150–400)
RBC: 4.18 MIL/uL — ABNORMAL LOW (ref 4.22–5.81)
RDW: 22.2 % — ABNORMAL HIGH (ref 11.5–15.5)
WBC: 8.5 10*3/uL (ref 4.0–10.5)
nRBC: 0.6 % — ABNORMAL HIGH (ref 0.0–0.2)

## 2024-10-12 LAB — RENAL FUNCTION PANEL
Albumin: 3.5 g/dL (ref 3.5–5.0)
Anion gap: 16 — ABNORMAL HIGH (ref 5–15)
BUN: 79 mg/dL — ABNORMAL HIGH (ref 8–23)
CO2: 29 mmol/L (ref 22–32)
Calcium: 9.6 mg/dL (ref 8.9–10.3)
Chloride: 97 mmol/L — ABNORMAL LOW (ref 98–111)
Creatinine, Ser: 7.64 mg/dL — ABNORMAL HIGH (ref 0.61–1.24)
GFR, Estimated: 7 mL/min — ABNORMAL LOW
Glucose, Bld: 222 mg/dL — ABNORMAL HIGH (ref 70–99)
Phosphorus: 5.1 mg/dL — ABNORMAL HIGH (ref 2.5–4.6)
Potassium: 4.9 mmol/L (ref 3.5–5.1)
Sodium: 141 mmol/L (ref 135–145)

## 2024-10-12 MED ORDER — VANCOMYCIN HCL IN DEXTROSE 1-5 GM/200ML-% IV SOLN: 1000.0000 mg | Freq: Once | INTRAVENOUS | Status: DC

## 2024-10-12 MED ORDER — METOPROLOL TARTRATE 25 MG PO TABS
25.0000 mg | ORAL_TABLET | Freq: Two times a day (BID) | ORAL | Status: DC
  Administered 2024-10-12 – 2024-10-15 (×7): 25 mg via ORAL
  Filled 2024-10-12 (×7): qty 1

## 2024-10-12 MED ORDER — METOPROLOL TARTRATE 5 MG/5ML IV SOLN: 2.5000 mg | Freq: Once | INTRAVENOUS | Status: AC

## 2024-10-12 MED ORDER — METOPROLOL TARTRATE 5 MG/5ML IV SOLN
2.5000 mg | Freq: Once | INTRAVENOUS | Status: AC
  Administered 2024-10-12: 2.5 mg via INTRAVENOUS

## 2024-10-12 MED ORDER — VANCOMYCIN HCL IN DEXTROSE 1-5 GM/200ML-% IV SOLN
INTRAVENOUS | Status: AC
  Filled 2024-10-12: qty 200

## 2024-10-12 MED ORDER — METOPROLOL TARTRATE 5 MG/5ML IV SOLN
INTRAVENOUS | Status: AC
  Administered 2024-10-12: 2.5 mg via INTRAVENOUS
  Filled 2024-10-12: qty 5

## 2024-10-12 NOTE — Plan of Care (Signed)
   Problem: Coping: Goal: Ability to adjust to condition or change in health will improve Outcome: Progressing

## 2024-10-12 NOTE — Progress Notes (Signed)
 This RN notified Mdala-Gausi MD of HR:120's -140's.

## 2024-10-13 DIAGNOSIS — G9341 Metabolic encephalopathy: Secondary | ICD-10-CM | POA: Diagnosis not present

## 2024-10-13 LAB — CULTURE, BLOOD (ROUTINE X 2)
Culture: NO GROWTH
Culture: NO GROWTH
Special Requests: ADEQUATE

## 2024-10-13 MED ORDER — CHLORHEXIDINE GLUCONATE CLOTH 2 % EX PADS
6.0000 | MEDICATED_PAD | Freq: Every day | CUTANEOUS | Status: DC
  Administered 2024-10-14 – 2024-10-15 (×2): 6 via TOPICAL

## 2024-10-13 NOTE — Progress Notes (Signed)
" °  [facility] Progress Note   Subjective:    Disoriented again today Moaning and mumbling, talking to himself   Objective Vitals:   10/13/24 0109 10/13/24 0441 10/13/24 0500 10/13/24 0925  BP: 116/78 (!) 109/48  116/68  Pulse: 76 86  100  Resp: 20 12  15   Temp: 98 F (36.7 C) 98.2 F (36.8 C)  97.7 F (36.5 C)  TempSrc: Oral Oral  Oral  SpO2: 100% 94%  100%  Weight:   106.3 kg   Height:       Physical Exam General: not alert, moaning, confused  Heart: RRR, no murmur Lungs: CTA bilaterally, respirations unlabored Abdomen: non-distended  Extremities: b/l BKAs, no edema  Dialysis Access: LUE AVF + t/b    Dialysis Orders: [facility]  4h  106kg  B450  2K bath  Hep none  Mircera 200mcg IV q 2 weeks- last 10/03/24 Venofer  50mg  weekly Hectorol  2mcg Iv q HD  Assessment/Plan:  AMS: Started on sepsis protocol, had recent admission for AMS and had pneumonia at that time. Work up per admitting team. Extubated 1/28. MS not much better really, which is unusual for him.   ESRD: usual HD is TTS. Tolerated HD here 1/28 and 1/29. Missed HD over w/e due to weather. Had HD on Monday. Next HD off schedule on Wed.   Hypertension/volume: BP soft, no vol excess on exam  Anemia: Hgb at goal, Not due for ESA  Metabolic bone disease: Calcium  controlled, follow phos (3.8 on 1/29). Resume binders once eating  Nutrition:  poor nutritional status Alb 2.9. FTT: failure to thrive in this pt w/ mult comorbidities   Myer Fret  MD  [facility] 10/13/2024, 3:12 PM  Recent Labs  Lab 10/08/24 0229 10/10/24 0328 10/12/24 0824  HGB 10.0* 10.9* 12.0*  ALBUMIN  2.9*  --  3.5  CALCIUM  8.0* 9.0 9.6  PHOS 3.8  --  5.1*  CREATININE 6.14* 5.05* 7.64*  K 4.3 4.2 4.9    Inpatient medications:  apixaban   5 mg Oral BID   atorvastatin   80 mg Oral Daily   Chlorhexidine  Gluconate Cloth  6 each Topical Daily   Chlorhexidine  Gluconate Cloth  6 each Topical Q0600   Chlorhexidine  Gluconate Cloth  6  each Topical Q0600   famotidine   10 mg Oral QODAY   feeding supplement  237 mL Oral BID BM   lactulose   10 g Oral BID   metoprolol  tartrate  25 mg Oral BID   multivitamin  1 tablet Oral QHS   nutrition supplement (JUVEN)  1 packet Oral BID BM    meropenem  (MERREM ) IV 1 g (10/13/24 1342)   vancomycin      ipratropium-albuterol , mouth rinse, oxyCODONE         "

## 2024-10-13 NOTE — Care Management Important Message (Signed)
 Important Message  Patient Details  Name: MANDO BLATZ MRN: [id number] Date of Birth: August 11, 1956   Important Message Given:  Yes - Medicare IM     Vonzell Arrie Sharps 10/13/2024, 11:18 AM

## 2024-10-13 NOTE — Plan of Care (Signed)
   Problem: Coping: Goal: Ability to adjust to condition or change in health will improve Outcome: Progressing

## 2024-10-14 DIAGNOSIS — G9341 Metabolic encephalopathy: Secondary | ICD-10-CM | POA: Diagnosis not present

## 2024-10-14 LAB — CBC
HCT: 39.6 % (ref 39.0–52.0)
Hemoglobin: 12.1 g/dL — ABNORMAL LOW (ref 13.0–17.0)
MCH: 29.2 pg (ref 26.0–34.0)
MCHC: 30.6 g/dL (ref 30.0–36.0)
MCV: 95.7 fL (ref 80.0–100.0)
Platelets: 219 10*3/uL (ref 150–400)
RBC: 4.14 MIL/uL — ABNORMAL LOW (ref 4.22–5.81)
RDW: 22.7 % — ABNORMAL HIGH (ref 11.5–15.5)
WBC: 7.5 10*3/uL (ref 4.0–10.5)
nRBC: 0.5 % — ABNORMAL HIGH (ref 0.0–0.2)

## 2024-10-14 LAB — BASIC METABOLIC PANEL WITH GFR
Anion gap: 14 (ref 5–15)
BUN: 55 mg/dL — ABNORMAL HIGH (ref 8–23)
CO2: 29 mmol/L (ref 22–32)
Calcium: 9.3 mg/dL (ref 8.9–10.3)
Chloride: 97 mmol/L — ABNORMAL LOW (ref 98–111)
Creatinine, Ser: 6.17 mg/dL — ABNORMAL HIGH (ref 0.61–1.24)
GFR, Estimated: 9 mL/min — ABNORMAL LOW
Glucose, Bld: 216 mg/dL — ABNORMAL HIGH (ref 70–99)
Potassium: 5.4 mmol/L — ABNORMAL HIGH (ref 3.5–5.1)
Sodium: 140 mmol/L (ref 135–145)

## 2024-10-14 LAB — AMMONIA: Ammonia: 16 umol/L (ref 9–35)

## 2024-10-14 MED ORDER — HEPARIN SODIUM (PORCINE) 1000 UNIT/ML DIALYSIS: 1000.0000 [IU] | INTRAMUSCULAR | Status: DC | PRN

## 2024-10-14 MED ORDER — ALTEPLASE 2 MG IJ SOLR: 2.0000 mg | Freq: Once | INTRAMUSCULAR | Status: DC | PRN

## 2024-10-14 MED ORDER — LIDOCAINE HCL (PF) 1 % IJ SOLN: 5.0000 mL | INTRAMUSCULAR | Status: DC | PRN

## 2024-10-14 MED ORDER — PENTAFLUOROPROP-TETRAFLUOROETH EX AERO: 1.0000 | INHALATION_SPRAY | CUTANEOUS | Status: DC | PRN

## 2024-10-14 MED ORDER — LIDOCAINE-PRILOCAINE 2.5-2.5 % EX CREA: 1.0000 | TOPICAL_CREAM | CUTANEOUS | Status: DC | PRN

## 2024-10-14 MED ORDER — ANTICOAGULANT SODIUM CITRATE 4% (200MG/5ML) IV SOLN: 5.0000 mL | Status: DC | PRN

## 2024-10-14 NOTE — Progress Notes (Signed)
" °   10/14/24 1145  Vitals  Temp 97.6 F (36.4 C)  Pulse Rate (!) 104  Resp (!) 29  BP 120/80  SpO2 100 %  O2 Device Room Air  Weight 103.7 kg  Type of Weight Post-Dialysis  Oxygen  Therapy  Patient Activity (if Appropriate) In bed  Pulse Oximetry Type Continuous  Post Treatment  Dialyzer Clearance Clear  Liters Processed 72  Fluid Removed (mL) 1000 mL  Tolerated HD Treatment Yes  Post-Hemodialysis Comments pt. tolerated tx well  AVG/AVF Arterial Site Held (minutes) 7 minutes  AVG/AVF Venous Site Held (minutes) 7 minutes    "

## 2024-10-14 NOTE — Plan of Care (Signed)
" °  Problem: Health Behavior/Discharge Planning: Goal: Ability to manage health-related needs will improve Outcome: Progressing   Problem: Nutritional: Goal: Maintenance of adequate nutrition will improve Outcome: Progressing   Problem: Skin Integrity: Goal: Risk for impaired skin integrity will decrease Outcome: Progressing   Problem: Activity: Goal: Ability to tolerate increased activity will improve Outcome: Progressing   "

## 2024-10-14 NOTE — Progress Notes (Addendum)
 " PROGRESS NOTE    [name]  FMW:[id number] DOB: 1956/07/30 DOA: 10/06/2024 PCP: Montey Lot, PA-C   Brief Narrative:   69 year old male with history of bilateral BKA, chronic A-fib on Eliquis , ESRD on dialysis on TTS schedule, insulin -dependent diabetes type 2, hypertension, moderate aortic stenosis, chronic pain syndrome, chronic right-sided systolic heart failure who presented from SNF being encephalopathic, decreased left upper extremity movement, left facial droop.  On presentation, he was lethargic, tachycardic, hypoxic on room air, had marked fever and was hypotensive.  Patient was intubated for airway protection.  Concern for sepsis, started on empiric antibiotics.  Found to have 2 fentanyl  patch on the back, concern for polypharmacy, given Narcan .  Neurology also consulted.  He was initially recently admitted and was discharged in early January and at that time he was treated for pneumonia, proteus  bacteremia and also underwent amputation of left ring finger by orthopedics.   He was admitted under PCCM service this time.  Neurology, nephrology consulted. Extubated on 1/28.  Patient transferred to TRH service on 1/30.  He is from [facility] skilled nursing facility.  Assessment & Plan:  Principal Problem:   Acute metabolic encephalopathy Active Problems:   Acute encephalopathy   Malnutrition of moderate degree   Acute metabolic encephalopathy: Presented with concern for stroke also with decreased left upper extremity movement, left facial droop.  Neurology consulted.  MRI of the brain, MRA head and neck did not show acute findings, showed old infarcts. Of note, patient has multiple remote cortical and subcortical infarcts involving the left frontal lobe, bilateral parietal lobes, left temporal and left occipital lobes.  Also noted diffuse small remote bilateral cerebellar infarcts and a remote lacunar infarct at the right thalamus. Vascular dementia would certainly be possible  in this setting.  Polypharmacy is a strong possibility, finding of 2 fentanyl  patches .  He was also on gabapentin .  Given Narcan . Mentation improved with dialysis but not yet back to baseline.  Only on  oxycodone  for now.  Hold gabapentin  indefinitely.  Neurology signed off. Continue to monitor mentation.  His mentation is still not back to baseline.  Continue to monitor neurological status closely.   Chronic A-fib RVR during dialysis on 2/2. S/p IV metoprolol  x 2. - Continue scheduled metoprolol  p.o. - Continue Eliquis . - Plan to transition back to home Coreg .   Sepsis secondary to pneumonia/ESBL E.coli in sputum:   Blood culture sent here showed Staph epidermidis, suspected  contamination.  Currently on vancomycin , plan was for total  5 days of treatment  as per PCCM,given on dialysis. Respiratory culture showed ESBL E. coli, currently on meropenem , vancomycin .  Remains afebrile, no leukocytosis.  Repeat blood cultures sent on 1/29 showed no growth to date   Acute hypoxic respiratory failure/history of COPD/OSA: Intubated on arrival for airway protection.  Has been extubated on 1/28.  Currently on room air.  Not in COPD exacerbation.  History of tobacco use   ESRD on dialysis: Nephrology following for dialysis.  Has AV fistula on the left upper extremity.  Hemodialysis to be done today.  Patient's usual schedule is Tuesday Thursday and Saturday.  Tolerated hemodialysis on 1/28 and 1/29.  Next dialysis session will be done today.   Left hand fourth finger gangrene:  S/P  amputation on last admission.  Orthopedics recommended to continue dressing until the outpatient follow-up in 7-10 days.  Wound care was consulted.   Orthopedics will see patient today, 2/4.    Type 2 diabetes mellitus:  Continue current insulin  regimen.  Monitor blood sugars.  Recent A1c of 6.9.   Hyperlipidemia: On Lipitor    History of moderate aortic stenosis: Patient echo showed EF of 50 to 55%, moderate aortic  stenosis.  We recommend to follow-up with cardiology as an outpatient   Anemia chronic disease: Currently hemoglobin stable   Debility/: Deconditioning S/p bilateral BKA. Patient residing at [facility].   On statin for peripheral artery disease.    Poor prognosis Spoke with the patient's wife. She notes that the patient has had similar episodes in the past and has sometimes taken a while to recover. This is not the first time the patient has responded this way to fentanyl  patches. We spoke about the patient's general condition including his many medical challenges-ESRD on HD, evidence of multiple strokes, poor p.o. intake and failure to thrive, PAD s/p BKA.  Wife declines feeding tube at this time but would like to revisit if patient is still not eating by the weekend. Patient has been followed by palliative services in his nursing home. Wife says she will consider transitioning him to hospice if he fails to improve within about a week. -Consider Dobbhoff tube 10/16/24 - If no improvement in encephalopathy by 10/21/2024, consider transitioning to hospice.    DVT prophylaxis:  apixaban  (ELIQUIS ) tablet 5 mg     Code Status: Full Code Family Communication: Spoke to patient's wife on the phone Status is: Inpatient Remains inpatient appropriate because: Acute encephalopathy, ESRD patient    Subjective:  No acute events overnight.  Still confused.   Examination:  General: Lethargic.   Eyes: Pupils equal, reactive  Oral cavity: moist mucous membranes  Head: Atraumatic, normocephalic  Neck: supple  Chest: clear to auscultation. No crackles, no wheezes  CVS: S1,S2 RRR. No murmurs  Abd: No distention, soft, non-tender. No masses palpable  Extr: No edema   MSK: Bilateral BKA Neurological: Grossly intact.    Wound 08/25/24 2047 Pressure Injury Sacrum Stage 2 -  Partial thickness loss of dermis presenting as a shallow open injury with a red, pink wound bed without slough.  (Active)     Diet Orders (From admission, onward)     Start     Ordered   10/08/24 1138  DIET DYS 2 Room service appropriate? Yes; Fluid consistency: Thin  Diet effective now       Question Answer Comment  Room service appropriate? Yes   Fluid consistency: Thin      10/08/24 1138            Objective: Vitals:   10/14/24 1107 10/14/24 1137 10/14/24 1141 10/14/24 1145  BP: 115/83 112/81 126/85 120/80  Pulse: 94 (!) 108 84 (!) 104  Resp: 20 (!) 32 (!) 28 (!) 29  Temp:    97.6 F (36.4 C)  TempSrc:      SpO2: 100% 100% 100% 100%  Weight:    103.7 kg  Height:        Intake/Output Summary (Last 24 hours) at 10/14/2024 1218 Last data filed at 10/14/2024 1145 Gross per 24 hour  Intake 300 ml  Output 1000 ml  Net -700 ml   Filed Weights   10/14/24 0300 10/14/24 0817 10/14/24 1145  Weight: 105 kg 103.6 kg 103.7 kg    Scheduled Meds:  apixaban   5 mg Oral BID   atorvastatin   80 mg Oral Daily   Chlorhexidine  Gluconate Cloth  6 each Topical Daily   Chlorhexidine  Gluconate Cloth  6 each Topical Q0600  Chlorhexidine  Gluconate Cloth  6 each Topical Q0600   Chlorhexidine  Gluconate Cloth  6 each Topical Q0600   famotidine   10 mg Oral QODAY   feeding supplement  237 mL Oral BID BM   lactulose   10 g Oral BID   metoprolol  tartrate  25 mg Oral BID   multivitamin  1 tablet Oral QHS   nutrition supplement (JUVEN)  1 packet Oral BID BM   Continuous Infusions:  anticoagulant sodium citrate       Nutritional status Signs/Symptoms: moderate muscle depletion, mild fat depletion Interventions: Ensure Enlive (each supplement provides 350kcal and 20 grams of protein), MVI Body mass index is 29.35 kg/m.  Data Reviewed:   CBC: Recent Labs  Lab 10/08/24 0229 10/10/24 0328 10/12/24 0824 10/14/24 0411  WBC 8.0 8.8 8.5 7.5  HGB 10.0* 10.9* 12.0* 12.1*  HCT 32.6* 35.6* 39.6 39.6  MCV 93.7 92.7 94.7 95.7  PLT 154 187 207 219   Basic Metabolic Panel: Recent Labs  Lab  10/08/24 0229 10/10/24 0328 10/12/24 0824 10/14/24 0411  NA 139 137 141 140  K 4.3 4.2 4.9 5.4*  CL 96* 96* 97* 97*  CO2 23 25 29 29   GLUCOSE 124* 215* 222* 216*  BUN 42* 43* 79* 55*  CREATININE 6.14* 5.05* 7.64* 6.17*  CALCIUM  8.0* 9.0 9.6 9.3  PHOS 3.8  --  5.1*  --    GFR: Estimated Creatinine Clearance: 14.3 mL/min (A) (by C-G formula based on SCr of 6.17 mg/dL (H)). Liver Function Tests: Recent Labs  Lab 10/08/24 0229 10/12/24 0824  ALBUMIN  2.9* 3.5   No results for input(s): LIPASE, AMYLASE in the last 168 hours. Recent Labs  Lab 10/08/24 1134  AMMONIA 17   Coagulation Profile: No results for input(s): INR, PROTIME in the last 168 hours.  Cardiac Enzymes: No results for input(s): CKTOTAL, CKMB, CKMBINDEX, TROPONINI in the last 168 hours. BNP (last 3 results) Recent Labs    09/19/24 0336 09/19/24 2040 10/06/24 0809  PROBNP >35,000.0* >35,000.0* >35,000.0*   HbA1C: No results for input(s): HGBA1C in the last 72 hours. CBG: Recent Labs  Lab 10/07/24 1954 10/07/24 2339 10/08/24 0334 10/08/24 0736 10/08/24 1140  GLUCAP 164* 153* 133* 133* 121*   Lipid Profile: No results for input(s): CHOL, HDL, LDLCALC, TRIG, CHOLHDL, LDLDIRECT in the last 72 hours. Thyroid  Function Tests: No results for input(s): TSH, T4TOTAL, FREET4, T3FREE, THYROIDAB in the last 72 hours. Anemia Panel: No results for input(s): VITAMINB12, FOLATE, FERRITIN, TIBC, IRON, RETICCTPCT in the last 72 hours. Sepsis Labs: No results for input(s): PROCALCITON, LATICACIDVEN in the last 168 hours.   Recent Results (from the past 240 hours)  Blood Culture (routine x 2)     Status: Abnormal   Collection Time: 10/06/24  8:05 AM   Specimen: BLOOD RIGHT FOREARM  Result Value Ref Range Status   Specimen Description BLOOD RIGHT FOREARM  Final   Special Requests   Final    BOTTLES DRAWN AEROBIC AND ANAEROBIC Blood Culture adequate volume    Culture  Setup Time   Final    GRAM POSITIVE COCCI IN CLUSTERS IN BOTH AEROBIC AND ANAEROBIC BOTTLES CRITICAL RESULT CALLED TO, READ BACK BY AND VERIFIED WITH: PHARMD B WANNARAT [id number] AT 916 AM BY CM    Culture (A)  Final    STAPHYLOCOCCUS EPIDERMIDIS THE SIGNIFICANCE OF ISOLATING THIS ORGANISM FROM A SINGLE SET OF BLOOD CULTURES WHEN MULTIPLE SETS ARE DRAWN IS UNCERTAIN. PLEASE NOTIFY THE MICROBIOLOGY DEPARTMENT WITHIN ONE WEEK IF SPECIATION AND  SENSITIVITIES ARE REQUIRED. Performed at [facility], [address] [place], [place] [address]    Report Status 10/09/2024 FINAL  Final  Blood Culture ID Panel (Reflexed)     Status: Abnormal   Collection Time: 10/06/24  8:05 AM  Result Value Ref Range Status   Enterococcus faecalis NOT DETECTED NOT DETECTED Final   Enterococcus Faecium NOT DETECTED NOT DETECTED Final   Listeria monocytogenes NOT DETECTED NOT DETECTED Final   Staphylococcus species DETECTED (A) NOT DETECTED Final    Comment: CRITICAL RESULT CALLED TO, READ BACK BY AND VERIFIED WITH: PHARMD B WANNRAT [id number] AT 915 BY CM    Staphylococcus aureus (BCID) NOT DETECTED NOT DETECTED Final   Staphylococcus epidermidis DETECTED (A) NOT DETECTED Final    Comment: Methicillin (oxacillin) resistant coagulase negative staphylococcus. Possible blood culture contaminant (unless isolated from more than one blood culture draw or clinical case suggests pathogenicity). No antibiotic treatment is indicated for blood  culture contaminants. CRITICAL RESULT CALLED TO, READ BACK BY AND VERIFIED WITH: PHARMD B WANNATAR [id number] AT 915 BY CM    Staphylococcus lugdunensis NOT DETECTED NOT DETECTED Final   Streptococcus species NOT DETECTED NOT DETECTED Final   Streptococcus agalactiae NOT DETECTED NOT DETECTED Final   Streptococcus pneumoniae NOT DETECTED NOT DETECTED Final   Streptococcus pyogenes NOT DETECTED NOT DETECTED Final   A.calcoaceticus-baumannii NOT DETECTED NOT DETECTED Final    Bacteroides fragilis NOT DETECTED NOT DETECTED Final   Enterobacterales NOT DETECTED NOT DETECTED Final   Enterobacter cloacae complex NOT DETECTED NOT DETECTED Final   Escherichia coli NOT DETECTED NOT DETECTED Final   Klebsiella aerogenes NOT DETECTED NOT DETECTED Final   Klebsiella oxytoca NOT DETECTED NOT DETECTED Final   Klebsiella pneumoniae NOT DETECTED NOT DETECTED Final   Proteus species NOT DETECTED NOT DETECTED Final   Salmonella species NOT DETECTED NOT DETECTED Final   Serratia marcescens NOT DETECTED NOT DETECTED Final   Haemophilus influenzae NOT DETECTED NOT DETECTED Final   Neisseria meningitidis NOT DETECTED NOT DETECTED Final   Pseudomonas aeruginosa NOT DETECTED NOT DETECTED Final   Stenotrophomonas maltophilia NOT DETECTED NOT DETECTED Final   Candida albicans NOT DETECTED NOT DETECTED Final   Candida auris NOT DETECTED NOT DETECTED Final   Candida glabrata NOT DETECTED NOT DETECTED Final   Candida krusei NOT DETECTED NOT DETECTED Final   Candida parapsilosis NOT DETECTED NOT DETECTED Final   Candida tropicalis NOT DETECTED NOT DETECTED Final   Cryptococcus neoformans/gattii NOT DETECTED NOT DETECTED Final   Methicillin resistance mecA/C DETECTED (A) NOT DETECTED Final    Comment: CRITICAL RESULT CALLED TO, READ BACK BY AND VERIFIED WITH: PHARMD B MIGDALIA [id number] AT 915 BY CM Performed at [facility], [address], [place], [place] [address]   Resp panel by RT-PCR (RSV, Flu A&B, Covid) Anterior Nasal Swab     Status: None   Collection Time: 10/06/24  9:31 AM   Specimen: Anterior Nasal Swab  Result Value Ref Range Status   SARS Coronavirus 2 by RT PCR NEGATIVE NEGATIVE Final   Influenza A by PCR NEGATIVE NEGATIVE Final   Influenza B by PCR NEGATIVE NEGATIVE Final    Comment: (NOTE) The Xpert Xpress SARS-CoV-2/FLU/RSV plus assay is intended as an aid in the diagnosis of influenza from Nasopharyngeal swab specimens and should not be used as a sole  basis for treatment. Nasal washings and aspirates are unacceptable for Xpert Xpress SARS-CoV-2/FLU/RSV testing.  Fact Sheet for Patients: bloggercourse.com  Fact Sheet for  Healthcare Providers: seriousbroker.it  This test is not yet approved or cleared by the [place]  FDA and has been authorized for detection and/or diagnosis of SARS-CoV-2 by FDA under an Emergency Use Authorization (EUA). This EUA will remain in effect (meaning this test can be used) for the duration of the COVID-19 declaration under Section 564(b)(1) of the Act, 21 U.S.C. section 360bbb-3(b)(1), unless the authorization is terminated or revoked.     Resp Syncytial Virus by PCR NEGATIVE NEGATIVE Final    Comment: (NOTE) Fact Sheet for Patients: bloggercourse.com  Fact Sheet for Healthcare Providers: seriousbroker.it  This test is not yet approved or cleared by the [place]  FDA and has been authorized for detection and/or diagnosis of SARS-CoV-2 by FDA under an Emergency Use Authorization (EUA). This EUA will remain in effect (meaning this test can be used) for the duration of the COVID-19 declaration under Section 564(b)(1) of the Act, 21 U.S.C. section 360bbb-3(b)(1), unless the authorization is terminated or revoked.  Performed at [facility], [address], [place], [place] [address]   Culture, Respiratory w Gram Stain     Status: None   Collection Time: 10/06/24 12:36 PM   Specimen: Tracheal Aspirate; Respiratory  Result Value Ref Range Status   Specimen Description TRACHEAL ASPIRATE  Final   Special Requests NONE  Final   Gram Stain   Final    FEW WBC PRESENT, PREDOMINANTLY PMN FEW GRAM POSITIVE COCCI Performed at [facility], [address], [place], [place] [address]    Culture   Final    FEW ESCHERICHIA COLI Confirmed Extended Spectrum Beta-Lactamase Producer (ESBL).   In bloodstream infections from ESBL organisms, carbapenems are preferred over piperacillin /tazobactam. They are shown to have a lower risk of mortality.    Report Status 10/08/2024 FINAL  Final   Organism ID, Bacteria ESCHERICHIA COLI  Final      Susceptibility   Escherichia coli - MIC*    AMPICILLIN  >=32 RESISTANT Resistant     CEFAZOLIN  (NON-URINE) >=32 RESISTANT Resistant     CEFEPIME  >=32 RESISTANT Resistant     ERTAPENEM <=0.12 SENSITIVE Sensitive     CEFTRIAXONE  >=64 RESISTANT Resistant     CIPROFLOXACIN  >=4 RESISTANT Resistant     GENTAMICIN  <=1 SENSITIVE Sensitive     MEROPENEM  <=0.25 SENSITIVE Sensitive     TRIMETH /SULFA  >=320 RESISTANT Resistant     AMPICILLIN /SULBACTAM >=32 RESISTANT Resistant     PIP/TAZO Value in next row Sensitive      8 SENSITIVEThis is a modified FDA-approved test that has been validated and its performance characteristics determined by the reporting laboratory.  This laboratory is certified under the Clinical Laboratory Improvement Amendments CLIA as qualified to perform high complexity clinical laboratory testing.    * FEW ESCHERICHIA COLI  MRSA Next Gen by PCR, Nasal     Status: Abnormal   Collection Time: 10/06/24  1:00 PM  Result Value Ref Range Status   MRSA by PCR Next Gen DETECTED (A) NOT DETECTED Final    Comment: RESULT CALLED TO, READ BACK BY AND VERIFIED WITH: RN Tillman on [phone] @1830  by SM (NOTE) The GeneXpert MRSA Assay (FDA approved for NASAL specimens only), is one component of a comprehensive MRSA colonization surveillance program. It is not intended to diagnose MRSA infection nor to guide or monitor treatment for MRSA infections. Test performance is not FDA approved in patients less than 76 years old. Performed at [facility], [address] [place], [place] [address]  Blood Culture (routine x 2)     Status: None   Collection Time: 10/06/24  7:52 PM   Specimen: BLOOD LEFT HAND  Result Value Ref Range Status   Specimen  Description BLOOD LEFT HAND  Final   Special Requests   Final    BOTTLES DRAWN AEROBIC AND ANAEROBIC Blood Culture adequate volume   Culture   Final    NO GROWTH 5 DAYS Performed at [facility], [address] [place], [place] [address]    Report Status 10/11/2024 FINAL  Final  Culture, blood (Routine X 2) w Reflex to ID Panel     Status: None   Collection Time: 10/08/24  1:29 PM   Specimen: BLOOD RIGHT HAND  Result Value Ref Range Status   Specimen Description BLOOD RIGHT HAND  Final   Special Requests   Final    BOTTLES DRAWN AEROBIC AND ANAEROBIC Blood Culture results may not be optimal due to an inadequate volume of blood received in culture bottles   Culture   Final    NO GROWTH 5 DAYS Performed at [facility], [address] [place], [place] [address]    Report Status 10/13/2024 FINAL  Final  Culture, blood (Routine X 2) w Reflex to ID Panel     Status: None   Collection Time: 10/08/24  1:29 PM   Specimen: BLOOD RIGHT HAND  Result Value Ref Range Status   Specimen Description BLOOD RIGHT HAND  Final   Special Requests AEROBIC BOTTLE ONLY Blood Culture adequate volume  Final   Culture   Final    NO GROWTH 5 DAYS Performed at [facility], [address], [place], [place] [address]    Report Status 10/13/2024 FINAL  Final    Radiology Studies: No results found.    LOS: 8 days    MDALA-GAUSI, GOLDEN PILLOW, MD [facility]  If 7PM-7AM, please contact night-coverage  10/14/2024, 12:18 PM  "

## 2024-10-14 NOTE — Plan of Care (Signed)
   Problem: Coping: Goal: Ability to adjust to condition or change in health will improve Outcome: Progressing

## 2024-10-14 NOTE — Progress Notes (Signed)
 Pt. Came in alert and responsive with no complaints Consent verified and on file Started with no complications  UF removal: Tx duration: 3 Hours  Access used: Left AVF Access issue: None  Tx completed and tolerated Pressure dressing applied Endorsed to floor nurse Transported to room   Estanislao Auther Shope, RN [facility]

## 2024-10-14 NOTE — Progress Notes (Signed)
" °  [facility] Progress Note   Subjective:    Mumbling again A bit more interactive, still confused  Objective Vitals:   10/14/24 1107 10/14/24 1137 10/14/24 1141 10/14/24 1145  BP: 115/83 112/81 126/85 120/80  Pulse: 94 (!) 108 84 (!) 104  Resp: 20 (!) 32 (!) 28 (!) 29  Temp:    97.6 F (36.4 C)  TempSrc:      SpO2: 100% 100% 100% 100%  Weight:    103.7 kg  Height:       Physical Exam General: not alert, moaning, confused  Heart: RRR, no murmur Lungs: CTA bilaterally, respirations unlabored Abdomen: non-distended  Extremities: b/l BKAs, no edema  Dialysis Access: LUE AVF + t/b    Dialysis Orders: [place] [facility]  4h  106kg  B450  2K bath  Hep none  Mircera 200mcg IV q 2 weeks- last 10/03/24 Venofer  50mg  weekly Hectorol  2mcg Iv q HD  Assessment/Plan:  AMS: Started on sepsis protocol. Extubated 1/28. MS not much better. Per pmd.   ESRD: usual HD is TTS. Tolerated HD here 1/28 and 1/29. Missed HD over w/e due to weather. Had HD here on Monday. Next HD today/ Wednesday.   Hypertension/volume: BP soft, no vol^ on exam. 2-3 kg below  Anemia: Hgb at goal, Not due for ESA  Metabolic bone disease: Calcium  controlled, follow phos (3.8 on 1/29). Resume binders once eating  Nutrition:  poor nutritional status Alb 2.9. FTT: failure to thrive in this pt w/ mult comorbidities   Myer Fret  MD  [facility] 10/14/2024, 1:41 PM  Recent Labs  Lab 10/08/24 0229 10/10/24 0328 10/12/24 0824 10/14/24 0411  HGB 10.0*   < > 12.0* 12.1*  ALBUMIN  2.9*  --  3.5  --   CALCIUM  8.0*   < > 9.6 9.3  PHOS 3.8  --  5.1*  --   CREATININE 6.14*   < > 7.64* 6.17*  K 4.3   < > 4.9 5.4*   < > = values in this interval not displayed.    Inpatient medications:  apixaban   5 mg Oral BID   atorvastatin   80 mg Oral Daily   Chlorhexidine  Gluconate Cloth  6 each Topical Daily   Chlorhexidine  Gluconate Cloth  6 each Topical Q0600   Chlorhexidine  Gluconate Cloth  6 each Topical Q0600    Chlorhexidine  Gluconate Cloth  6 each Topical Q0600   famotidine   10 mg Oral QODAY   feeding supplement  237 mL Oral BID BM   lactulose   10 g Oral BID   metoprolol  tartrate  25 mg Oral BID   multivitamin  1 tablet Oral QHS   nutrition supplement (JUVEN)  1 packet Oral BID BM     ipratropium-albuterol , mouth rinse, oxyCODONE         "

## 2024-10-15 MED ORDER — CHLORHEXIDINE GLUCONATE CLOTH 2 % EX PADS: 6.0000 | MEDICATED_PAD | Freq: Every day | CUTANEOUS | Status: DC

## 2024-10-15 MED ORDER — CARVEDILOL 12.5 MG PO TABS
12.5000 mg | ORAL_TABLET | Freq: Two times a day (BID) | ORAL | Status: AC
  Administered 2024-10-15 – 2024-10-16 (×2): 12.5 mg via ORAL
  Filled 2024-10-15 (×2): qty 1

## 2024-10-15 NOTE — Progress Notes (Addendum)
 " PROGRESS NOTE    [name]  FMW:[id number] DOB: Sep 27, 1955 DOA: 10/06/2024 PCP: Montey Lot, PA-C   Brief Narrative:   69 year old male with history of bilateral BKA, chronic A-fib on Eliquis , ESRD on dialysis on TTS schedule, insulin -dependent diabetes type 2, hypertension, moderate aortic stenosis, chronic pain syndrome, chronic right-sided systolic heart failure who presented from SNF being encephalopathic, decreased left upper extremity movement, left facial droop.  On presentation, he was lethargic, tachycardic, hypoxic on room air, had marked fever and was hypotensive.  Patient was intubated for airway protection.  Concern for sepsis, started on empiric antibiotics.  Found to have 2 fentanyl  patch on the back, concern for polypharmacy, given Narcan .  Neurology also consulted.  He was initially recently admitted and was discharged in early January and at that time he was treated for pneumonia, proteus  bacteremia and also underwent amputation of left ring finger by orthopedics.   He was admitted under PCCM service this time.  Neurology, nephrology consulted. Extubated on 1/28.  Patient transferred to TRH service on 1/30.  He is from [facility] skilled nursing facility.  Assessment & Plan:  Principal Problem:   Acute metabolic encephalopathy Active Problems:   Acute encephalopathy   Malnutrition of moderate degree   Acute metabolic encephalopathy: Presented with concern for stroke also with decreased left upper extremity movement, left facial droop.  Neurology consulted.  MRI of the brain, MRA head and neck did not show acute findings, showed old infarcts. Of note, patient has multiple remote cortical and subcortical infarcts involving the left frontal lobe, bilateral parietal lobes, left temporal and left occipital lobes.  Also noted diffuse small remote bilateral cerebellar infarcts and a remote lacunar infarct at the right thalamus. Vascular dementia would certainly be possible  in this setting.  Polypharmacy is a strong possibility, finding of 2 fentanyl  patches .  He was also on gabapentin .  Given Narcan . Mentation improved with dialysis but not yet back to baseline.  Only on  oxycodone  for now.   -Hold gabapentin  indefinitely.   -Neurology signed off. -Continue to monitor mentation.  His mentation is still not back to baseline.     Chronic A-fib RVR during dialysis on 2/2. S/p IV metoprolol  x 2. - On scheduled metoprolol  p.o. - Continue Eliquis . - Plan to transition back to home Coreg  2/5.   Sepsis secondary to pneumonia/ESBL E.coli in sputum:   Blood culture sent here showed Staph epidermidis, suspected  contamination.  Currently on vancomycin , plan was for total  5 days of treatment  as per PCCM,given on dialysis. Respiratory culture showed ESBL E. coli, currently on meropenem , vancomycin .  Remains afebrile, no leukocytosis.  Repeat blood cultures sent on 1/29 showed no growth to date   Acute hypoxic respiratory failure/history of COPD/OSA: Intubated on arrival for airway protection.  Has been extubated on 1/28.  Currently on room air.  Not in COPD exacerbation.  History of tobacco use   ESRD on dialysis: Nephrology following for dialysis.  Has AV fistula on the left upper extremity.  Hemodialysis to be done today.  Patient's usual schedule is Tuesday Thursday and Saturday.  Tolerated hemodialysis on 1/28 and 1/29.  Next dialysis session will be done today.   Left hand fourth finger gangrene:  S/P  amputation on last admission.  Orthopedics recommended to continue dressing until the outpatient follow-up in 7-10 days.  Wound care was consulted.   Orthopedics saw patient on 2/5.  Dressing was changed. Ongoing drainage noted at incisional site  with some blistering on palmar aspect. Per orthopedics: In the future, may have to give some consideration for full ray resection of the digit.  However we can continue to allow full demarcation over the upcoming weeks given  the poor vascularity to the digit and hand.   Type 2 diabetes mellitus: Continue current insulin  regimen.  Monitor blood sugars.  Recent A1c of 6.9.   Hyperlipidemia: On Lipitor    History of moderate aortic stenosis: Patient echo showed EF of 50 to 55%, moderate aortic stenosis.  We recommend to follow-up with cardiology as an outpatient   Anemia chronic disease: Currently hemoglobin stable   Debility/: Deconditioning S/p bilateral BKA. Patient residing at [facility].   On statin for peripheral artery disease.    Poor prognosis Spoke with the patient's wife. She notes that the patient has had similar episodes in the past and has sometimes taken a while to recover. This is not the first time the patient has responded this way to fentanyl  patches. We spoke about the patient's general condition including his many medical challenges-ESRD on HD, evidence of multiple strokes, poor p.o. intake and failure to thrive, PAD s/p BKA.  Wife declines feeding tube at this time but would like to revisit if patient is still not eating by the weekend. Patient has been followed by palliative services in his nursing home. Wife says she will consider transitioning him to hospice if he fails to improve within about a week. -Consider Dobbhoff tube 10/16/24 - If no improvement in encephalopathy by 10/21/2024, consider transitioning to hospice.    DVT prophylaxis:  apixaban  (ELIQUIS ) tablet 5 mg     Code Status: Full Code Family Communication:  n/a Status is: Inpatient Remains inpatient appropriate because: Acute encephalopathy, ESRD patient    Subjective:  No acute events overnight.  Still confused.   Examination:  General: Lethargic.   Eyes: Pupils equal, reactive  Oral cavity: moist mucous membranes  Head: Atraumatic, normocephalic  Neck: supple  Chest: clear to auscultation. No crackles, no wheezes  CVS: S1,S2 RRR. No murmurs  Abd: No distention, soft, non-tender. No masses palpable   Extr: No edema   MSK: Bilateral BKA, left hand in dressing Neurological: Grossly intact.    Wound 08/25/24 2047 Pressure Injury Sacrum Stage 2 -  Partial thickness loss of dermis presenting as a shallow open injury with a red, pink wound bed without slough. (Active)     Diet Orders (From admission, onward)     Start     Ordered   10/08/24 1138  DIET DYS 2 Room service appropriate? Yes; Fluid consistency: Thin  Diet effective now       Question Answer Comment  Room service appropriate? Yes   Fluid consistency: Thin      10/08/24 1138            Objective: Vitals:   10/15/24 0426 10/15/24 0500 10/15/24 0954 10/15/24 1239  BP: 115/68  115/68 (!) 108/58  Pulse: 89  (!) 105 93  Resp: 20   14  Temp: 97.9 F (36.6 C)   97.6 F (36.4 C)  TempSrc: Oral   Oral  SpO2: 100%   100%  Weight:  102.4 kg    Height:        Intake/Output Summary (Last 24 hours) at 10/15/2024 1421 Last data filed at 10/14/2024 2107 Gross per 24 hour  Intake 100 ml  Output --  Net 100 ml   Filed Weights   10/14/24 0817 10/14/24 1145 10/15/24  0500  Weight: 103.6 kg 103.7 kg 102.4 kg    Scheduled Meds:  apixaban   5 mg Oral BID   atorvastatin   80 mg Oral Daily   Chlorhexidine  Gluconate Cloth  6 each Topical Daily   Chlorhexidine  Gluconate Cloth  6 each Topical Q0600   Chlorhexidine  Gluconate Cloth  6 each Topical Q0600   Chlorhexidine  Gluconate Cloth  6 each Topical Q0600   famotidine   10 mg Oral QODAY   feeding supplement  237 mL Oral BID BM   lactulose   10 g Oral BID   metoprolol  tartrate  25 mg Oral BID   multivitamin  1 tablet Oral QHS   nutrition supplement (JUVEN)  1 packet Oral BID BM   Continuous Infusions:    Nutritional status Signs/Symptoms: moderate muscle depletion, mild fat depletion Interventions: Ensure Enlive (each supplement provides 350kcal and 20 grams of protein), MVI Body mass index is 28.98 kg/m.  Data Reviewed:   CBC: Recent Labs  Lab 10/10/24 0328  10/12/24 0824 10/14/24 0411  WBC 8.8 8.5 7.5  HGB 10.9* 12.0* 12.1*  HCT 35.6* 39.6 39.6  MCV 92.7 94.7 95.7  PLT 187 207 219   Basic Metabolic Panel: Recent Labs  Lab 10/10/24 0328 10/12/24 0824 10/14/24 0411  NA 137 141 140  K 4.2 4.9 5.4*  CL 96* 97* 97*  CO2 25 29 29   GLUCOSE 215* 222* 216*  BUN 43* 79* 55*  CREATININE 5.05* 7.64* 6.17*  CALCIUM  9.0 9.6 9.3  PHOS  --  5.1*  --    GFR: Estimated Creatinine Clearance: 14.2 mL/min (A) (by C-G formula based on SCr of 6.17 mg/dL (H)). Liver Function Tests: Recent Labs  Lab 10/12/24 0824  ALBUMIN  3.5   No results for input(s): LIPASE, AMYLASE in the last 168 hours. Recent Labs  Lab 10/14/24 1432  AMMONIA 16   Coagulation Profile: No results for input(s): INR, PROTIME in the last 168 hours.  Cardiac Enzymes: No results for input(s): CKTOTAL, CKMB, CKMBINDEX, TROPONINI in the last 168 hours. BNP (last 3 results) Recent Labs    09/19/24 0336 09/19/24 2040 10/06/24 0809  PROBNP >35,000.0* >35,000.0* >35,000.0*   HbA1C: No results for input(s): HGBA1C in the last 72 hours. CBG: No results for input(s): GLUCAP in the last 168 hours.  Lipid Profile: No results for input(s): CHOL, HDL, LDLCALC, TRIG, CHOLHDL, LDLDIRECT in the last 72 hours. Thyroid  Function Tests: No results for input(s): TSH, T4TOTAL, FREET4, T3FREE, THYROIDAB in the last 72 hours. Anemia Panel: No results for input(s): VITAMINB12, FOLATE, FERRITIN, TIBC, IRON, RETICCTPCT in the last 72 hours. Sepsis Labs: No results for input(s): PROCALCITON, LATICACIDVEN in the last 168 hours.   Recent Results (from the past 240 hours)  Blood Culture (routine x 2)     Status: Abnormal   Collection Time: 10/06/24  8:05 AM   Specimen: BLOOD RIGHT FOREARM  Result Value Ref Range Status   Specimen Description BLOOD RIGHT FOREARM  Final   Special Requests   Final    BOTTLES DRAWN AEROBIC AND  ANAEROBIC Blood Culture adequate volume   Culture  Setup Time   Final    GRAM POSITIVE COCCI IN CLUSTERS IN BOTH AEROBIC AND ANAEROBIC BOTTLES CRITICAL RESULT CALLED TO, READ BACK BY AND VERIFIED WITH: PHARMD B WANNARAT [id number] AT 916 AM BY CM    Culture (A)  Final    STAPHYLOCOCCUS EPIDERMIDIS THE SIGNIFICANCE OF ISOLATING THIS ORGANISM FROM A SINGLE SET OF BLOOD CULTURES WHEN MULTIPLE SETS ARE DRAWN IS  UNCERTAIN. PLEASE NOTIFY THE MICROBIOLOGY DEPARTMENT WITHIN ONE WEEK IF SPECIATION AND SENSITIVITIES ARE REQUIRED. Performed at [facility], [address] [place], [place] [address]    Report Status 10/09/2024 FINAL  Final  Blood Culture ID Panel (Reflexed)     Status: Abnormal   Collection Time: 10/06/24  8:05 AM  Result Value Ref Range Status   Enterococcus faecalis NOT DETECTED NOT DETECTED Final   Enterococcus Faecium NOT DETECTED NOT DETECTED Final   Listeria monocytogenes NOT DETECTED NOT DETECTED Final   Staphylococcus species DETECTED (A) NOT DETECTED Final    Comment: CRITICAL RESULT CALLED TO, READ BACK BY AND VERIFIED WITH: PHARMD B WANNRAT [id number] AT 915 BY CM    Staphylococcus aureus (BCID) NOT DETECTED NOT DETECTED Final   Staphylococcus epidermidis DETECTED (A) NOT DETECTED Final    Comment: Methicillin (oxacillin) resistant coagulase negative staphylococcus. Possible blood culture contaminant (unless isolated from more than one blood culture draw or clinical case suggests pathogenicity). No antibiotic treatment is indicated for blood  culture contaminants. CRITICAL RESULT CALLED TO, READ BACK BY AND VERIFIED WITH: PHARMD B WANNATAR [id number] AT 915 BY CM    Staphylococcus lugdunensis NOT DETECTED NOT DETECTED Final   Streptococcus species NOT DETECTED NOT DETECTED Final   Streptococcus agalactiae NOT DETECTED NOT DETECTED Final   Streptococcus pneumoniae NOT DETECTED NOT DETECTED Final   Streptococcus pyogenes NOT DETECTED NOT DETECTED Final    A.calcoaceticus-baumannii NOT DETECTED NOT DETECTED Final   Bacteroides fragilis NOT DETECTED NOT DETECTED Final   Enterobacterales NOT DETECTED NOT DETECTED Final   Enterobacter cloacae complex NOT DETECTED NOT DETECTED Final   Escherichia coli NOT DETECTED NOT DETECTED Final   Klebsiella aerogenes NOT DETECTED NOT DETECTED Final   Klebsiella oxytoca NOT DETECTED NOT DETECTED Final   Klebsiella pneumoniae NOT DETECTED NOT DETECTED Final   Proteus species NOT DETECTED NOT DETECTED Final   Salmonella species NOT DETECTED NOT DETECTED Final   Serratia marcescens NOT DETECTED NOT DETECTED Final   Haemophilus influenzae NOT DETECTED NOT DETECTED Final   Neisseria meningitidis NOT DETECTED NOT DETECTED Final   Pseudomonas aeruginosa NOT DETECTED NOT DETECTED Final   Stenotrophomonas maltophilia NOT DETECTED NOT DETECTED Final   Candida albicans NOT DETECTED NOT DETECTED Final   Candida auris NOT DETECTED NOT DETECTED Final   Candida glabrata NOT DETECTED NOT DETECTED Final   Candida krusei NOT DETECTED NOT DETECTED Final   Candida parapsilosis NOT DETECTED NOT DETECTED Final   Candida tropicalis NOT DETECTED NOT DETECTED Final   Cryptococcus neoformans/gattii NOT DETECTED NOT DETECTED Final   Methicillin resistance mecA/C DETECTED (A) NOT DETECTED Final    Comment: CRITICAL RESULT CALLED TO, READ BACK BY AND VERIFIED WITH: PHARMD B MIGDALIA [id number] AT 915 BY CM Performed at [facility], [address], [place], [place] [address]   Resp panel by RT-PCR (RSV, Flu A&B, Covid) Anterior Nasal Swab     Status: None   Collection Time: 10/06/24  9:31 AM   Specimen: Anterior Nasal Swab  Result Value Ref Range Status   SARS Coronavirus 2 by RT PCR NEGATIVE NEGATIVE Final   Influenza A by PCR NEGATIVE NEGATIVE Final   Influenza B by PCR NEGATIVE NEGATIVE Final    Comment: (NOTE) The Xpert Xpress SARS-CoV-2/FLU/RSV plus assay is intended as an aid in the diagnosis of influenza from  Nasopharyngeal swab specimens and should not be used as a sole basis for treatment. Nasal washings and aspirates are unacceptable for Xpert Xpress  SARS-CoV-2/FLU/RSV testing.  Fact Sheet for Patients: bloggercourse.com  Fact Sheet for Healthcare Providers: seriousbroker.it  This test is not yet approved or cleared by the [place]  FDA and has been authorized for detection and/or diagnosis of SARS-CoV-2 by FDA under an Emergency Use Authorization (EUA). This EUA will remain in effect (meaning this test can be used) for the duration of the COVID-19 declaration under Section 564(b)(1) of the Act, 21 U.S.C. section 360bbb-3(b)(1), unless the authorization is terminated or revoked.     Resp Syncytial Virus by PCR NEGATIVE NEGATIVE Final    Comment: (NOTE) Fact Sheet for Patients: bloggercourse.com  Fact Sheet for Healthcare Providers: seriousbroker.it  This test is not yet approved or cleared by the [place]  FDA and has been authorized for detection and/or diagnosis of SARS-CoV-2 by FDA under an Emergency Use Authorization (EUA). This EUA will remain in effect (meaning this test can be used) for the duration of the COVID-19 declaration under Section 564(b)(1) of the Act, 21 U.S.C. section 360bbb-3(b)(1), unless the authorization is terminated or revoked.  Performed at [facility], [address], [place], [place] [address]   Culture, Respiratory w Gram Stain     Status: None   Collection Time: 10/06/24 12:36 PM   Specimen: Tracheal Aspirate; Respiratory  Result Value Ref Range Status   Specimen Description TRACHEAL ASPIRATE  Final   Special Requests NONE  Final   Gram Stain   Final    FEW WBC PRESENT, PREDOMINANTLY PMN FEW GRAM POSITIVE COCCI Performed at [facility], [address], [place], [place] [address]    Culture   Final    FEW ESCHERICHIA  COLI Confirmed Extended Spectrum Beta-Lactamase Producer (ESBL).  In bloodstream infections from ESBL organisms, carbapenems are preferred over piperacillin /tazobactam. They are shown to have a lower risk of mortality.    Report Status 10/08/2024 FINAL  Final   Organism ID, Bacteria ESCHERICHIA COLI  Final      Susceptibility   Escherichia coli - MIC*    AMPICILLIN  >=32 RESISTANT Resistant     CEFAZOLIN  (NON-URINE) >=32 RESISTANT Resistant     CEFEPIME  >=32 RESISTANT Resistant     ERTAPENEM <=0.12 SENSITIVE Sensitive     CEFTRIAXONE  >=64 RESISTANT Resistant     CIPROFLOXACIN  >=4 RESISTANT Resistant     GENTAMICIN  <=1 SENSITIVE Sensitive     MEROPENEM  <=0.25 SENSITIVE Sensitive     TRIMETH /SULFA  >=320 RESISTANT Resistant     AMPICILLIN /SULBACTAM >=32 RESISTANT Resistant     PIP/TAZO Value in next row Sensitive      8 SENSITIVEThis is a modified FDA-approved test that has been validated and its performance characteristics determined by the reporting laboratory.  This laboratory is certified under the Clinical Laboratory Improvement Amendments CLIA as qualified to perform high complexity clinical laboratory testing.    * FEW ESCHERICHIA COLI  MRSA Next Gen by PCR, Nasal     Status: Abnormal   Collection Time: 10/06/24  1:00 PM  Result Value Ref Range Status   MRSA by PCR Next Gen DETECTED (A) NOT DETECTED Final    Comment: RESULT CALLED TO, READ BACK BY AND VERIFIED WITH: RN Tillman on [phone] @1830  by SM (NOTE) The GeneXpert MRSA Assay (FDA approved for NASAL specimens only), is one component of a comprehensive MRSA colonization surveillance program. It is not intended to diagnose MRSA infection nor to guide or monitor treatment for MRSA infections. Test performance is not FDA approved in patients less than 30 years old. Performed at  [facility], [address] [place], [place] [address]   Blood Culture (routine x 2)     Status: None   Collection Time: 10/06/24  7:52 PM    Specimen: BLOOD LEFT HAND  Result Value Ref Range Status   Specimen Description BLOOD LEFT HAND  Final   Special Requests   Final    BOTTLES DRAWN AEROBIC AND ANAEROBIC Blood Culture adequate volume   Culture   Final    NO GROWTH 5 DAYS Performed at [facility], [address] [place], [place] [address]    Report Status 10/11/2024 FINAL  Final  Culture, blood (Routine X 2) w Reflex to ID Panel     Status: None   Collection Time: 10/08/24  1:29 PM   Specimen: BLOOD RIGHT HAND  Result Value Ref Range Status   Specimen Description BLOOD RIGHT HAND  Final   Special Requests   Final    BOTTLES DRAWN AEROBIC AND ANAEROBIC Blood Culture results may not be optimal due to an inadequate volume of blood received in culture bottles   Culture   Final    NO GROWTH 5 DAYS Performed at [facility], [address] [place], [place] [address]    Report Status 10/13/2024 FINAL  Final  Culture, blood (Routine X 2) w Reflex to ID Panel     Status: None   Collection Time: 10/08/24  1:29 PM   Specimen: BLOOD RIGHT HAND  Result Value Ref Range Status   Specimen Description BLOOD RIGHT HAND  Final   Special Requests AEROBIC BOTTLE ONLY Blood Culture adequate volume  Final   Culture   Final    NO GROWTH 5 DAYS Performed at [facility], [address] [place], [place] [address]    Report Status 10/13/2024 FINAL  Final    Radiology Studies: No results found.    LOS: 9 days    MDALA-GAUSI, GOLDEN PILLOW, MD [facility]  If 7PM-7AM, please contact night-coverage  10/15/2024, 2:21 PM  "

## 2024-10-15 NOTE — Progress Notes (Signed)
 HAND SURGERY UPDATE NOTE:  Patient was seen at the bedside this morning.  Patient remains confused at baseline.    Dressing was changed.  Does have some ongoing drainage at the incisional site with some blistering of the palmar aspect of the skin.  Sutures were removed today.  Will recommend wound care for daily dressing changes moving forward.  Given his overall medical state, we will continue to take conservative approach for now.  In the future, may have to give some consideration for full ray resection of the digit.  However we can continue to allow full demarcation over the upcoming weeks given the poor vascularity to the digit and hand.  Marice Angelino, MD Hand Surgery, [place]

## 2024-10-15 NOTE — Progress Notes (Signed)
" °  [facility] Progress Note   Subjective:    More relaxed, responding to simple questions  Objective Vitals:   10/15/24 0426 10/15/24 0500 10/15/24 0954 10/15/24 1239  BP: 115/68  115/68 (!) 108/58  Pulse: 89  (!) 105 93  Resp: 20   14  Temp: 97.9 F (36.6 C)   97.6 F (36.4 C)  TempSrc: Oral   Oral  SpO2: 100%   100%  Weight:  102.4 kg    Height:       Physical Exam General: awakens to voice, more purposeful Heart: RRR, no murmur Lungs: CTA bilaterally, respirations unlabored Abdomen: non-distended  Extremities: b/l BKAs, no edema  Dialysis Access: LUE AVF + t/b    Dialysis Orders: [facility]  4h  106kg  B450  AVF  2K bath  Hep none  Mircera 200mcg IV q 2 weeks- last 10/03/24 Venofer  50mg  weekly Hectorol  2mcg Iv q HD  Assessment/Plan:  AMS: Started on sepsis protocol. Extubated 1/28. MS improving.   ESRD: usual HD is TTS. Has had HD this week mon/ wed, next hd Fri    Hypertension/volume: BP soft, no vol^ on exam. 2-3 kg below  Anemia: Hgb at goal, Not due for ESA  Metabolic bone disease: Calcium  controlled, follow phos (3.8 on 1/29). Resume binders once eating  Nutrition:  poor nutritional status Alb 2.9. FTT: failure to thrive in this pt w/ mult comorbidities   Myer Fret  MD  [facility] 10/15/2024, 2:39 PM  Recent Labs  Lab 10/12/24 0824 10/14/24 0411  HGB 12.0* 12.1*  ALBUMIN  3.5  --   CALCIUM  9.6 9.3  PHOS 5.1*  --   CREATININE 7.64* 6.17*  K 4.9 5.4*    Inpatient medications:  apixaban   5 mg Oral BID   atorvastatin   80 mg Oral Daily   carvedilol   12.5 mg Oral BID WC   Chlorhexidine  Gluconate Cloth  6 each Topical Daily   Chlorhexidine  Gluconate Cloth  6 each Topical Q0600   Chlorhexidine  Gluconate Cloth  6 each Topical Q0600   Chlorhexidine  Gluconate Cloth  6 each Topical Q0600   famotidine   10 mg Oral QODAY   feeding supplement  237 mL Oral BID BM   lactulose   10 g Oral BID   multivitamin  1 tablet Oral QHS   nutrition  supplement (JUVEN)  1 packet Oral BID BM     ipratropium-albuterol , mouth rinse, oxyCODONE         "

## 2024-10-15 NOTE — Progress Notes (Addendum)
 Nutrition Follow-up  DOCUMENTATION CODES:  Non-severe (moderate) malnutrition in context of chronic illness  INTERVENTION:  Continue current diet as ordered Encourage PO intake, considering Cortrak placement 2/6 if PO inadequate Calorie Count initiated: Please document percent consumed for each item on the patient's meal tray ticket and keep in envelope. Also document percent of any supplement or snack pt consumes and keep documentation in envelope for RD to review.   Continue Ensure Plus High Protein po BID, each supplement provides 350 kcal and 20 grams of protein, prefers vanilla  Added Magic cup TID with meals, each supplement provides 290 kcal and 9 grams of protein  Continue Renal MVI daily  Continue Juven BID, each packet provides 95 calories, 2.5 grams of protein (collagen), and micronutrients to support wound healing  NUTRITION DIAGNOSIS:  Moderate Malnutrition related to acute illness, chronic illness as evidenced by moderate muscle depletion, mild fat depletion. - remains applicable  GOAL:  Patient will meet greater than or equal to 90% of their needs - progressing  MONITOR:  PO intake, Supplement acceptance, I & O's, Labs  REASON FOR ASSESSMENT:  Ventilator, Consult Enteral/tube feeding initiation and management  ASSESSMENT:  Pt with hx of CAD s/p bilateral BKAs, CHF, atrial fibrillation, DM type 2, COPD, HTN, HLD, and ESRD on HD presented to ED from his SNF with AMS and hypoxia. Workup consistent with sepsis and unintentional opiate overdose.  Recent admissions: 12/16-12/24, 1/9-1/16  1/27 - presented to ED, intubated due to mentation 1/28 - extubated 1/29- transferred to Progressive  Pt resting in bed at time of assessment. Pt had mittens on due to trying to pull at lines. RN reports mittens are taken off for meal times and pt has supervision at meals. Pt had breakfast tray untouched at bedside. Pt reports he is not hungry but will try to eat. Encouraged  nursing/ tech to give pt time to eat and encourage PO intake. Pt states he is not used to drinking Ensure shakes so unsure if he likes them but will try to drink 1 per day. Added magic cups to trays and MD requested calorie count initiation to accurately assess PO intake. Discussed calorie count with nursing and will follow up. Wife considering Cortrak placement tomorrow if PO intake does not improve.    Pt reports no GI discomforts impacting appetite.   Pt continues to tolerate HD treatment, last treatment yesterday 2/4 -1L.   Average Meal Completion: 1/30-2/3: 24% average intake x 8 recorded meals  Nutritionally Relevant Medications: Pepcid   Lactulose  BID RenaVit Juven BID  Labs reviewed: Potassium 5.4 (prior to last HD) BUN 55/ Cr 6.17 (prior to last HD) Phos 5.1 (from 2/2)  Admit weight: 89.5 kg Current weight: 102.4 kg  Weight trends difficult to determine, pt's recorded weights previously increased 8 kg in one day (suspect inaccurate). Recommend bedscale zeroed and pt's reweighed for more accurate weight  Diet Order:   Diet Order             DIET DYS 2 Room service appropriate? Yes; Fluid consistency: Thin  Diet effective now                   EDUCATION NEEDS:  No education needs have been identified at this time  Skin:  Skin Assessment: Skin Integrity Issues: Skin Tear - right arm (2 x 2 x 0.5 cm) Surgical incision: left hand ring finger, recent amputation (2 x 2 cm) Stage 2: sacrum (7 x 6 cm)  Last BM:  2/3  Height:  Ht Readings from Last 1 Encounters:  10/06/24 6' 2 (1.88 m)   Weight:  Wt Readings from Last 1 Encounters:  10/15/24 102.4 kg    Ideal Body Weight:  76.2 kg (adjusted by 11.8 for bilateral BKA)  BMI:  Body mass index is 31.4 kg/m. (using 1/28 wt)  Estimated Nutritional Needs:  Kcal:  2100-2300 kcal/d Protein:  110-125g/d Fluid:  1L+UOP    Josette Glance, MS, RDN, LDN Clinical Dietitian I Please reach out via secure  chat

## 2024-10-15 NOTE — TOC Progression Note (Signed)
 Transition of Care [facility]) - Progression Note    Patient Details  Name: [name] MRN: [id number] Date of Birth: 06-May-1956  Transition of [facility]) CM/SW Contact  [name], [name] Phone Number: 10/15/2024, 2:31 PM  Clinical Narrative:      Pt is from [facility] continuing to follow   Expected Discharge Plan: Skilled Nursing Facility Barriers to Discharge: Continued Medical Work up               Expected Discharge Plan and Services In-house Referral: Clinical Social Work                                             Social Drivers of Health (SDOH) Interventions SDOH Screenings   Food Insecurity: No Food Insecurity (03/25/2024)  Housing: Low Risk (03/25/2024)  Transportation Needs: No Transportation Needs (03/25/2024)  Utilities: Not At Risk (03/25/2024)  Social Connections: Moderately Isolated (03/25/2024)  Tobacco Use: High Risk (10/08/2024)    Readmission Risk Interventions    08/12/2024    1:17 PM 05/29/2023    4:41 PM  Readmission Risk Prevention Plan  Transportation Screening Complete Complete  Medication Review Oceanographer) Complete Complete  PCP or Specialist appointment within 3-5 days of discharge  Complete  HRI or Home Care Consult Complete Complete  SW Recovery Care/Counseling Consult  Complete  Palliative Care Screening Not Applicable Not Applicable  Skilled Nursing Facility Not Applicable Complete

## 2024-10-16 MED ORDER — PENTAFLUOROPROP-TETRAFLUOROETH EX AERO: 1.0000 | INHALATION_SPRAY | CUTANEOUS | Status: DC | PRN

## 2024-10-16 MED ORDER — OXYCODONE HCL 5 MG PO TABS
ORAL_TABLET | ORAL | Status: AC
  Filled 2024-10-16: qty 1

## 2024-10-16 MED ORDER — LIDOCAINE HCL (PF) 1 % IJ SOLN: 5.0000 mL | INTRAMUSCULAR | Status: DC | PRN

## 2024-10-16 MED ORDER — ALTEPLASE 2 MG IJ SOLR: 2.0000 mg | Freq: Once | INTRAMUSCULAR | Status: DC | PRN

## 2024-10-16 MED ORDER — THIAMINE MONONITRATE 100 MG PO TABS
100.0000 mg | ORAL_TABLET | Freq: Every day | ORAL | Status: AC
  Filled 2024-10-16: qty 1

## 2024-10-16 MED ORDER — LIDOCAINE-PRILOCAINE 2.5-2.5 % EX CREA: 1.0000 | TOPICAL_CREAM | CUTANEOUS | Status: DC | PRN

## 2024-10-16 MED ORDER — HEPARIN SODIUM (PORCINE) 1000 UNIT/ML DIALYSIS: 1000.0000 [IU] | INTRAMUSCULAR | Status: DC | PRN

## 2024-10-16 MED ORDER — ANTICOAGULANT SODIUM CITRATE 4% (200MG/5ML) IV SOLN
5.0000 mL | Status: DC | PRN
  Filled 2024-10-16: qty 5

## 2024-10-16 NOTE — Progress Notes (Signed)
" °   10/16/24 1249  Vitals  Temp 98 F (36.7 C)  Temp Source Oral  BP 114/65  MAP (mmHg) 83  BP Location Right Arm  BP Method Automatic  Patient Position (if appropriate) Lying  Pulse Rate Source Monitor  ECG Heart Rate 78  Resp 15  Weight 101.3 kg  Type of Weight Post-Dialysis  Oxygen  Therapy  SpO2 100 %  O2 Device Room Air  Patient Activity (if Appropriate) In bed  During Treatment Monitoring  Blood Flow Rate (mL/min) 400 mL/min  Arterial Pressure (mmHg) -188.48 mmHg  Venous Pressure (mmHg) 192.92 mmHg  TMP (mmHg) 2.63 mmHg  Ultrafiltration Rate (mL/min) 699 mL/min  Dialysate Flow Rate (mL/min) 300 ml/min  Duration of HD Treatment -hour(s) 3.42 hour(s)  Cumulative Fluid Removed (mL) per Treatment  847.23  HD Safety Checks Performed Yes  Intra-Hemodialysis Comments Tx completed;Tolerated well  Post Treatment  Dialyzer Clearance Heavily streaked  Liters Processed 84  Fluid Removed (mL) 900 mL  Tolerated HD Treatment Yes  AVG/AVF Arterial Site Held (minutes) 6 minutes  AVG/AVF Venous Site Held (minutes) 8 minutes  Fistula / Graft Left Upper arm Arteriovenous fistula  Placement Date/Time: 07/21/20 0825   Placed prior to admission: No  Orientation: Left  Access Location: Upper arm  Access Type: Arteriovenous fistula  Site Condition No complications  Fistula / Graft Assessment Present;Thrill;Bruit  Status Deaccessed  Drainage Description None    "

## 2024-10-16 NOTE — Progress Notes (Signed)
 This RN notified by NS that he will be transferred to 11M06 from Dialysis, no any patient belongings found in the room, will call 11M bedside RN for report.

## 2024-10-16 NOTE — Progress Notes (Signed)
 Calorie Count Note  48 hour calorie count ordered.  Diet: dysphagia 2, thin liquids Supplements: Ensure Plus BID, Magic Cup TID  Estimated Nutritional Needs:  Kcal:  2100-2300 kcal/d Protein:  110-125g/d Fluid:  1L+UOP  Calorie count initiated yesterday. Unfortunately inconclusive d/t limited documentation.  - [facility] reflects no breakfast tray delivered yesterday.  - Dinner documented to be 50% consumed yesterday (~210 kcal and 17g protein)  - Breakfast and lunch not received d/t patient being off unit. RN reports pt did consume 100% Ensure prior to HD.  Discussed with MD, plan to continue calorie count over the weekend for full nutrition data and assess necessity for Cortrak on Monday.   NUTRITION DIAGNOSIS:  Moderate Malnutrition related to acute illness, chronic illness as evidenced by moderate muscle depletion, mild fat depletion.  GOAL:  Patient will meet greater than or equal to 90% of their needs  INTERVENTION:  Continue current diet as ordered Encourage PO intake, considering Cortrak placement 2/6 if PO inadequate Calorie Count initiated: Please document percent consumed for each item on the patient's meal tray ticket and keep in envelope. Also document percent of any supplement or snack pt consumes and keep documentation in envelope for RD to review.    Continue Ensure Plus High Protein po BID, each supplement provides 350 kcal and 20 grams of protein, prefers vanilla   Added Magic cup TID with meals, each supplement provides 290 kcal and 9 grams of protein   Continue Renal MVI daily   Continue Juven BID, each packet provides 95 calories, 2.5 grams of protein (collagen), and micronutrients to support wound healing  Royce Maris, RDN, LDN Clinical Nutrition See AMiON for contact information.

## 2024-10-16 NOTE — Progress Notes (Signed)
" °  [facility] Progress Note   Subjective:    No c/o's today  Objective Vitals:   10/16/24 0930 10/16/24 1000 10/16/24 1030 10/16/24 1112  BP: 118/82 (!) 85/69 (!) 85/73 115/63  Pulse: (!) 135 76 91 81  Resp: 20 (!) 25 (!) 23 (!) 26  Temp:      TempSrc:      SpO2: 100% 100% 100% 100%  Weight:      Height:       Physical Exam General: awakens to voice, no distress, obese Heart: RRR, no murmur Lungs: CTA bilaterally, respirations unlabored Abdomen: non-distended  Extremities: b/l BKAs, no edema  Dialysis Access: LUE AVF + t/b  Home BP meds Coreg  12.5 bid   OP HD: [facility]  4h  106kg  B450  AVF  2K bath  Hep none  Mircera 200mcg IV q 2 weeks- last 10/03/24 Venofer  50mg  weekly Hectorol  2mcg Iv q HD  Assessment/Plan:  AMS: Started on sepsis protocol. Off abx, MS improved  ESRD: usual HD is TTS. Has had HD this week mon/ wed, next hd Fri    Volume: BP soft, no vol^ on exam. 2-3 kg below dry wt. 1 L UF w/ HD  Anemia: Hgb at goal, Not due for ESA  Metabolic bone disease: Calcium  controlled, follow phos. Resume binders once eating  Nutrition:  poor nutritional status Alb 2.9.  Myer Fret  MD  [facility] 10/16/2024, 11:35 AM  Recent Labs  Lab 10/12/24 0824 10/14/24 0411  HGB 12.0* 12.1*  ALBUMIN  3.5  --   CALCIUM  9.6 9.3  PHOS 5.1*  --   CREATININE 7.64* 6.17*  K 4.9 5.4*    Inpatient medications:  apixaban   5 mg Oral BID   atorvastatin   80 mg Oral Daily   carvedilol   12.5 mg Oral BID WC   Chlorhexidine  Gluconate Cloth  6 each Topical Daily   Chlorhexidine  Gluconate Cloth  6 each Topical Q0600   famotidine   10 mg Oral QODAY   feeding supplement  237 mL Oral BID BM   lactulose   10 g Oral BID   multivitamin  1 tablet Oral QHS   nutrition supplement (JUVEN)  1 packet Oral BID BM    anticoagulant sodium citrate       alteplase , anticoagulant sodium citrate , heparin , ipratropium-albuterol , lidocaine  (PF), lidocaine -prilocaine , mouth rinse,  oxyCODONE , pentafluoroprop-tetrafluoroeth        "

## 2024-10-16 NOTE — Plan of Care (Signed)

## 2024-10-16 NOTE — Progress Notes (Signed)
 " PROGRESS NOTE    [name]  FMW:[id number] DOB: 03/11/1956 DOA: 10/06/2024 PCP: Montey Lot, PA-C   Brief Narrative:   69 year old male with history of bilateral BKA, chronic A-fib on Eliquis , ESRD on dialysis on TTS schedule, insulin -dependent diabetes type 2, hypertension, moderate aortic stenosis, chronic pain syndrome, chronic right-sided systolic heart failure who presented from SNF being encephalopathic, decreased left upper extremity movement, left facial droop.  On presentation, he was lethargic, tachycardic, hypoxic on room air, had marked fever and was hypotensive.  Patient was intubated for airway protection.  Concern for sepsis, started on empiric antibiotics.  Found to have 2 fentanyl  patch on the back, concern for polypharmacy, given Narcan .  Neurology also consulted.  He was initially recently admitted and was discharged in early January and at that time he was treated for pneumonia, proteus  bacteremia and also underwent amputation of left ring finger by orthopedics.   He was admitted under PCCM service this time.  Neurology, nephrology consulted. Extubated on 1/28.  Patient transferred to TRH service on 1/30.  He is from [facility] skilled nursing facility.  Assessment & Plan:  Principal Problem:   Acute metabolic encephalopathy Active Problems:   Acute encephalopathy   Malnutrition of moderate degree   Acute metabolic encephalopathy: Presented with concern for stroke also with decreased left upper extremity movement, left facial droop.  Neurology consulted.  MRI of the brain, MRA head and neck did not show acute findings, showed old infarcts. Of note, patient has multiple remote cortical and subcortical infarcts involving the left frontal lobe, bilateral parietal lobes, left temporal and left occipital lobes.  Also noted diffuse small remote bilateral cerebellar infarcts and a remote lacunar infarct at the right thalamus. Vascular dementia would certainly be possible  in this setting.  Polypharmacy is a strong possibility, finding of 2 fentanyl  patches .  He was also on gabapentin .  Given Narcan . Mentation improved with dialysis but not yet back to baseline.  Only on  oxycodone  for now.   -Hold gabapentin  indefinitely.   -Neurology signed off. -Continue to monitor mentation.  His mentation is still not back to baseline.   - Start IV thiamine     Chronic A-fib RVR during dialysis on 2/2. S/p IV metoprolol  x 2. - On scheduled metoprolol  p.o. - Continue Eliquis . - Plan to transition back to home Coreg  2/5.   Sepsis secondary to pneumonia/ESBL E.coli in sputum:   Blood culture sent here showed Staph epidermidis, suspected  contamination.  Currently on vancomycin , plan was for total  5 days of treatment  as per PCCM,given on dialysis. Respiratory culture showed ESBL E. coli, currently on meropenem , vancomycin .  Remains afebrile, no leukocytosis.  Repeat blood cultures sent on 1/29 showed no growth to date   Acute hypoxic respiratory failure/history of COPD/OSA: Intubated on arrival for airway protection.  Has been extubated on 1/28.  Currently on room air.  Not in COPD exacerbation.  History of tobacco use   ESRD on dialysis: Nephrology following for dialysis.  Has AV fistula on the left upper extremity.  Hemodialysis to be done today.  Patient's usual schedule is Tuesday Thursday and Saturday.  Tolerated hemodialysis on 1/28 and 1/29.  Next dialysis session will be done today.   Left hand fourth finger gangrene:  S/P  amputation on last admission.  Orthopedics recommended to continue dressing until the outpatient follow-up in 7-10 days.  Wound care was consulted.   Orthopedics saw patient on 2/5.  Dressing was changed. Ongoing  drainage noted at incisional site with some blistering on palmar aspect. Per orthopedics: In the future, may have to give some consideration for full ray resection of the digit.  However we can continue to allow full demarcation over the  upcoming weeks given the poor vascularity to the digit and hand.   Type 2 diabetes mellitus: Continue current insulin  regimen.  Monitor blood sugars.  Recent A1c of 6.9.   Hyperlipidemia: On Lipitor    History of moderate aortic stenosis: Patient echo showed EF of 50 to 55%, moderate aortic stenosis.  We recommend to follow-up with cardiology as an outpatient   Anemia chronic disease: Currently hemoglobin stable   Debility/: Deconditioning S/p bilateral BKA. Patient residing at [facility].   On statin for peripheral artery disease.    Poor prognosis Failure to thrive Spoke with the patient's wife. She notes that the patient has had similar episodes in the past and has sometimes taken a while to recover. This is not the first time the patient has responded this way to fentanyl  patches. We spoke about the patient's general condition including his many medical challenges-ESRD on HD, evidence of multiple strokes, poor p.o. intake and failure to thrive, PAD s/p BKA.  Wife declines feeding tube at this time but would like to revisit if patient is still not eating by the weekend. Patient has been followed by palliative services in his nursing home. Wife says she will consider transitioning him to hospice if he fails to improve within about a week. -Consider Dobbhoff tube. Calorie count ordered.  - If no improvement in encephalopathy by 10/21/2024, consider transitioning to hospice.    DVT prophylaxis:  apixaban  (ELIQUIS ) tablet 5 mg     Code Status: Full Code Family Communication:  n/a Status is: Inpatient Remains inpatient appropriate because: Acute encephalopathy, ESRD patient    Subjective:  No acute events overnight.  Still confused.   Examination:  General: Lethargic.   Eyes: Pupils equal, reactive  Oral cavity: moist mucous membranes  Head: Atraumatic, normocephalic  Neck: supple  Chest: clear to auscultation. No crackles, no wheezes  CVS: S1,S2 RRR. No murmurs   Abd: No distention, soft, non-tender. No masses palpable  Extr: No edema   MSK: Bilateral BKA, left hand in dressing Neurological: Grossly intact.    Wound 08/25/24 2047 Pressure Injury Sacrum Stage 2 -  Partial thickness loss of dermis presenting as a shallow open injury with a red, pink wound bed without slough. (Active)     Diet Orders (From admission, onward)     Start     Ordered   10/08/24 1138  DIET DYS 2 Room service appropriate? Yes; Fluid consistency: Thin  Diet effective now       Question Answer Comment  Room service appropriate? Yes   Fluid consistency: Thin      10/08/24 1138            Objective: Vitals:   10/16/24 1231 10/16/24 1249 10/16/24 1258 10/16/24 1336  BP: 117/67 114/65 (!) 110/58 109/63  Pulse: 93   80  Resp: (!) 28 15 (!) 26 19  Temp:  98 F (36.7 C)  97.8 F (36.6 C)  TempSrc:  Oral    SpO2:  100% 98% 96%  Weight:  101.3 kg    Height:        Intake/Output Summary (Last 24 hours) at 10/16/2024 1455 Last data filed at 10/16/2024 1430 Gross per 24 hour  Intake 360 ml  Output 900 ml  Net -  540 ml   Filed Weights   10/16/24 0500 10/16/24 0850 10/16/24 1249  Weight: 102.4 kg 104.1 kg 101.3 kg    Scheduled Meds:  apixaban   5 mg Oral BID   atorvastatin   80 mg Oral Daily   carvedilol   12.5 mg Oral BID WC   Chlorhexidine  Gluconate Cloth  6 each Topical Daily   Chlorhexidine  Gluconate Cloth  6 each Topical Q0600   famotidine   10 mg Oral QODAY   feeding supplement  237 mL Oral BID BM   lactulose   10 g Oral BID   multivitamin  1 tablet Oral QHS   nutrition supplement (JUVEN)  1 packet Oral BID BM   thiamine   100 mg Oral Daily   Continuous Infusions:    Nutritional status Signs/Symptoms: moderate muscle depletion, mild fat depletion Interventions: Ensure Enlive (each supplement provides 350kcal and 20 grams of protein), MVI Body mass index is 28.67 kg/m.  Data Reviewed:   CBC: Recent Labs  Lab 10/10/24 0328 10/12/24 0824  10/14/24 0411  WBC 8.8 8.5 7.5  HGB 10.9* 12.0* 12.1*  HCT 35.6* 39.6 39.6  MCV 92.7 94.7 95.7  PLT 187 207 219   Basic Metabolic Panel: Recent Labs  Lab 10/10/24 0328 10/12/24 0824 10/14/24 0411  NA 137 141 140  K 4.2 4.9 5.4*  CL 96* 97* 97*  CO2 25 29 29   GLUCOSE 215* 222* 216*  BUN 43* 79* 55*  CREATININE 5.05* 7.64* 6.17*  CALCIUM  9.0 9.6 9.3  PHOS  --  5.1*  --    GFR: Estimated Creatinine Clearance: 14.2 mL/min (A) (by C-G formula based on SCr of 6.17 mg/dL (H)). Liver Function Tests: Recent Labs  Lab 10/12/24 0824  ALBUMIN  3.5   No results for input(s): LIPASE, AMYLASE in the last 168 hours. Recent Labs  Lab 10/14/24 1432  AMMONIA 16   Coagulation Profile: No results for input(s): INR, PROTIME in the last 168 hours.  Cardiac Enzymes: No results for input(s): CKTOTAL, CKMB, CKMBINDEX, TROPONINI in the last 168 hours. BNP (last 3 results) Recent Labs    09/19/24 0336 09/19/24 2040 10/06/24 0809  PROBNP >35,000.0* >35,000.0* >35,000.0*   HbA1C: No results for input(s): HGBA1C in the last 72 hours. CBG: No results for input(s): GLUCAP in the last 168 hours.  Lipid Profile: No results for input(s): CHOL, HDL, LDLCALC, TRIG, CHOLHDL, LDLDIRECT in the last 72 hours. Thyroid  Function Tests: No results for input(s): TSH, T4TOTAL, FREET4, T3FREE, THYROIDAB in the last 72 hours. Anemia Panel: No results for input(s): VITAMINB12, FOLATE, FERRITIN, TIBC, IRON, RETICCTPCT in the last 72 hours. Sepsis Labs: No results for input(s): PROCALCITON, LATICACIDVEN in the last 168 hours.   Recent Results (from the past 240 hours)  Blood Culture (routine x 2)     Status: None   Collection Time: 10/06/24  7:52 PM   Specimen: BLOOD LEFT HAND  Result Value Ref Range Status   Specimen Description BLOOD LEFT HAND  Final   Special Requests   Final    BOTTLES DRAWN AEROBIC AND ANAEROBIC Blood Culture adequate  volume   Culture   Final    NO GROWTH 5 DAYS Performed at [facility], [address] [place], [place] [address]    Report Status 10/11/2024 FINAL  Final  Culture, blood (Routine X 2) w Reflex to ID Panel     Status: None   Collection Time: 10/08/24  1:29 PM   Specimen: BLOOD RIGHT HAND  Result Value Ref Range Status  Specimen Description BLOOD RIGHT HAND  Final   Special Requests   Final    BOTTLES DRAWN AEROBIC AND ANAEROBIC Blood Culture results may not be optimal due to an inadequate volume of blood received in culture bottles   Culture   Final    NO GROWTH 5 DAYS Performed at [facility], [address] [place], [place] [address]    Report Status 10/13/2024 FINAL  Final  Culture, blood (Routine X 2) w Reflex to ID Panel     Status: None   Collection Time: 10/08/24  1:29 PM   Specimen: BLOOD RIGHT HAND  Result Value Ref Range Status   Specimen Description BLOOD RIGHT HAND  Final   Special Requests AEROBIC BOTTLE ONLY Blood Culture adequate volume  Final   Culture   Final    NO GROWTH 5 DAYS Performed at [facility], [address] [place], [place] [address]    Report Status 10/13/2024 FINAL  Final    Radiology Studies: No results found.    LOS: 10 days    MDALA-GAUSI, GOLDEN PILLOW, MD [facility]  If 7PM-7AM, please contact night-coverage  10/16/2024, 2:55 PM  "
